# Patient Record
Sex: Male | Born: 1940 | Race: White | Hispanic: No | Marital: Married | Smoking: Never smoker
Health system: Southern US, Community
[De-identification: ages and names within clinical notes are randomized; demographics above are authoritative.]

## PROBLEM LIST (undated history)

## (undated) ENCOUNTER — Emergency Department (HOSPITAL_COMMUNITY): Admission: EM | Discharge status: Absent without leave | Payer: Medicare Other

## (undated) DIAGNOSIS — I10 Essential (primary) hypertension: Secondary | ICD-10-CM

## (undated) DIAGNOSIS — Z87442 Personal history of urinary calculi: Secondary | ICD-10-CM

## (undated) DIAGNOSIS — R0600 Dyspnea, unspecified: Secondary | ICD-10-CM

## (undated) DIAGNOSIS — N183 Chronic kidney disease, stage 3 unspecified: Secondary | ICD-10-CM

## (undated) DIAGNOSIS — H269 Unspecified cataract: Secondary | ICD-10-CM

## (undated) DIAGNOSIS — I350 Nonrheumatic aortic (valve) stenosis: Secondary | ICD-10-CM

## (undated) DIAGNOSIS — J849 Interstitial pulmonary disease, unspecified: Secondary | ICD-10-CM

## (undated) DIAGNOSIS — E785 Hyperlipidemia, unspecified: Secondary | ICD-10-CM

## (undated) DIAGNOSIS — N189 Chronic kidney disease, unspecified: Secondary | ICD-10-CM

## (undated) DIAGNOSIS — I441 Atrioventricular block, second degree: Secondary | ICD-10-CM

## (undated) DIAGNOSIS — R112 Nausea with vomiting, unspecified: Secondary | ICD-10-CM

## (undated) DIAGNOSIS — F419 Anxiety disorder, unspecified: Secondary | ICD-10-CM

## (undated) DIAGNOSIS — G4730 Sleep apnea, unspecified: Secondary | ICD-10-CM

## (undated) DIAGNOSIS — J84112 Idiopathic pulmonary fibrosis: Secondary | ICD-10-CM

## (undated) DIAGNOSIS — G709 Myoneural disorder, unspecified: Secondary | ICD-10-CM

## (undated) DIAGNOSIS — I2510 Atherosclerotic heart disease of native coronary artery without angina pectoris: Secondary | ICD-10-CM

## (undated) DIAGNOSIS — K219 Gastro-esophageal reflux disease without esophagitis: Secondary | ICD-10-CM

## (undated) DIAGNOSIS — E349 Endocrine disorder, unspecified: Secondary | ICD-10-CM

## (undated) DIAGNOSIS — F32A Depression, unspecified: Secondary | ICD-10-CM

## (undated) DIAGNOSIS — Z9889 Other specified postprocedural states: Secondary | ICD-10-CM

## (undated) DIAGNOSIS — G63 Polyneuropathy in diseases classified elsewhere: Secondary | ICD-10-CM

## (undated) DIAGNOSIS — D62 Acute posthemorrhagic anemia: Secondary | ICD-10-CM

## (undated) DIAGNOSIS — C801 Malignant (primary) neoplasm, unspecified: Secondary | ICD-10-CM

## (undated) HISTORY — DX: Chronic kidney disease, unspecified: N18.9

## (undated) HISTORY — DX: Hyperlipidemia, unspecified: E78.5

## (undated) HISTORY — DX: Sleep apnea, unspecified: G47.30

## (undated) HISTORY — PX: CATARACT EXTRACTION: SUR2

## (undated) HISTORY — PX: EYE SURGERY: SHX253

## (undated) HISTORY — DX: Essential (primary) hypertension: I10

## (undated) HISTORY — DX: Gastro-esophageal reflux disease without esophagitis: K21.9

## (undated) HISTORY — PX: OTHER SURGICAL HISTORY: SHX169

## (undated) HISTORY — DX: Unspecified cataract: H26.9

## (undated) HISTORY — DX: Myoneural disorder, unspecified: G70.9

---

## 1996-12-05 HISTORY — PX: KNEE SURGERY: SHX244

## 2000-07-31 ENCOUNTER — Ambulatory Visit (HOSPITAL_COMMUNITY): Admission: RE | Admit: 2000-07-31 | Discharge: 2000-07-31 | Payer: Self-pay | Admitting: Internal Medicine

## 2000-07-31 ENCOUNTER — Encounter: Payer: Self-pay | Admitting: Internal Medicine

## 2000-10-18 ENCOUNTER — Encounter: Payer: Self-pay | Admitting: Specialist

## 2000-10-18 ENCOUNTER — Ambulatory Visit (HOSPITAL_COMMUNITY): Admission: RE | Admit: 2000-10-18 | Discharge: 2000-10-18 | Payer: Self-pay | Admitting: Specialist

## 2001-01-29 ENCOUNTER — Encounter: Payer: Self-pay | Admitting: Specialist

## 2001-01-29 ENCOUNTER — Inpatient Hospital Stay (HOSPITAL_COMMUNITY): Admission: RE | Admit: 2001-01-29 | Discharge: 2001-02-01 | Payer: Self-pay | Admitting: Specialist

## 2001-08-20 ENCOUNTER — Other Ambulatory Visit: Admission: RE | Admit: 2001-08-20 | Discharge: 2001-08-20 | Payer: Self-pay | Admitting: Internal Medicine

## 2001-08-20 ENCOUNTER — Encounter (INDEPENDENT_AMBULATORY_CARE_PROVIDER_SITE_OTHER): Payer: Self-pay

## 2001-12-05 HISTORY — PX: LUMBAR LAMINECTOMY: SHX95

## 2002-10-11 ENCOUNTER — Encounter (INDEPENDENT_AMBULATORY_CARE_PROVIDER_SITE_OTHER): Payer: Self-pay | Admitting: *Deleted

## 2002-10-14 ENCOUNTER — Ambulatory Visit (HOSPITAL_COMMUNITY): Admission: RE | Admit: 2002-10-14 | Discharge: 2002-10-14 | Payer: Self-pay | Admitting: Internal Medicine

## 2002-12-29 ENCOUNTER — Emergency Department (HOSPITAL_COMMUNITY): Admission: EM | Admit: 2002-12-29 | Discharge: 2002-12-29 | Payer: Self-pay | Admitting: Emergency Medicine

## 2003-11-14 ENCOUNTER — Ambulatory Visit (HOSPITAL_BASED_OUTPATIENT_CLINIC_OR_DEPARTMENT_OTHER): Admission: RE | Admit: 2003-11-14 | Discharge: 2003-11-14 | Payer: Self-pay | Admitting: Otolaryngology

## 2006-02-12 ENCOUNTER — Inpatient Hospital Stay (HOSPITAL_COMMUNITY): Admission: EM | Admit: 2006-02-12 | Discharge: 2006-02-14 | Payer: Self-pay | Admitting: Emergency Medicine

## 2006-10-20 ENCOUNTER — Ambulatory Visit: Payer: Self-pay | Admitting: Internal Medicine

## 2006-11-15 ENCOUNTER — Encounter (INDEPENDENT_AMBULATORY_CARE_PROVIDER_SITE_OTHER): Payer: Self-pay | Admitting: *Deleted

## 2006-11-15 ENCOUNTER — Ambulatory Visit: Payer: Self-pay | Admitting: Internal Medicine

## 2007-09-16 ENCOUNTER — Observation Stay (HOSPITAL_COMMUNITY): Admission: EM | Admit: 2007-09-16 | Discharge: 2007-09-17 | Payer: Self-pay | Admitting: Emergency Medicine

## 2007-09-17 ENCOUNTER — Encounter (INDEPENDENT_AMBULATORY_CARE_PROVIDER_SITE_OTHER): Payer: Self-pay | Admitting: Internal Medicine

## 2007-09-19 ENCOUNTER — Ambulatory Visit (HOSPITAL_COMMUNITY): Admission: RE | Admit: 2007-09-19 | Discharge: 2007-09-19 | Payer: Self-pay | Admitting: Internal Medicine

## 2008-12-05 ENCOUNTER — Inpatient Hospital Stay (HOSPITAL_COMMUNITY): Admission: EM | Admit: 2008-12-05 | Discharge: 2008-12-08 | Payer: Self-pay | Admitting: Emergency Medicine

## 2008-12-05 ENCOUNTER — Ambulatory Visit: Payer: Self-pay | Admitting: Internal Medicine

## 2009-11-24 DIAGNOSIS — K219 Gastro-esophageal reflux disease without esophagitis: Secondary | ICD-10-CM | POA: Insufficient documentation

## 2009-12-05 HISTORY — PX: CARDIAC CATHETERIZATION: SHX172

## 2009-12-05 HISTORY — PX: TRIGGER FINGER RELEASE: SHX641

## 2010-11-19 ENCOUNTER — Encounter
Admission: RE | Admit: 2010-11-19 | Discharge: 2010-11-19 | Payer: Self-pay | Source: Home / Self Care | Attending: Specialist | Admitting: Specialist

## 2010-12-05 HISTORY — PX: CERVICAL LAMINECTOMY: SHX94

## 2010-12-05 HISTORY — PX: ANTERIOR FUSION CERVICAL SPINE: SUR626

## 2010-12-05 HISTORY — PX: POSTERIOR FUSION CERVICAL SPINE: SUR628

## 2011-02-03 ENCOUNTER — Inpatient Hospital Stay (HOSPITAL_COMMUNITY)
Admission: EM | Admit: 2011-02-03 | Discharge: 2011-02-05 | DRG: 392 | Disposition: A | Discharge status: Home / Self Care | Payer: Medicare Other | Attending: Internal Medicine | Admitting: Internal Medicine

## 2011-02-03 ENCOUNTER — Emergency Department (HOSPITAL_COMMUNITY): Payer: Medicare Other

## 2011-02-03 DIAGNOSIS — R42 Dizziness and giddiness: Secondary | ICD-10-CM | POA: Diagnosis present

## 2011-02-03 DIAGNOSIS — E785 Hyperlipidemia, unspecified: Secondary | ICD-10-CM | POA: Diagnosis present

## 2011-02-03 DIAGNOSIS — I4510 Unspecified right bundle-branch block: Secondary | ICD-10-CM | POA: Diagnosis present

## 2011-02-03 DIAGNOSIS — R0789 Other chest pain: Secondary | ICD-10-CM | POA: Diagnosis present

## 2011-02-03 DIAGNOSIS — G4733 Obstructive sleep apnea (adult) (pediatric): Secondary | ICD-10-CM | POA: Diagnosis present

## 2011-02-03 DIAGNOSIS — E119 Type 2 diabetes mellitus without complications: Secondary | ICD-10-CM | POA: Diagnosis present

## 2011-02-03 DIAGNOSIS — K219 Gastro-esophageal reflux disease without esophagitis: Principal | ICD-10-CM | POA: Diagnosis present

## 2011-02-03 DIAGNOSIS — I2510 Atherosclerotic heart disease of native coronary artery without angina pectoris: Secondary | ICD-10-CM | POA: Diagnosis present

## 2011-02-03 DIAGNOSIS — Z794 Long term (current) use of insulin: Secondary | ICD-10-CM

## 2011-02-03 DIAGNOSIS — I509 Heart failure, unspecified: Secondary | ICD-10-CM | POA: Diagnosis present

## 2011-02-03 DIAGNOSIS — I10 Essential (primary) hypertension: Secondary | ICD-10-CM | POA: Diagnosis present

## 2011-02-03 DIAGNOSIS — Z7982 Long term (current) use of aspirin: Secondary | ICD-10-CM

## 2011-02-03 LAB — CBC
HCT: 35.1 % — ABNORMAL LOW (ref 39.0–52.0)
Hemoglobin: 12.2 g/dL — ABNORMAL LOW (ref 13.0–17.0)
MCH: 30.5 pg (ref 26.0–34.0)
MCHC: 34.8 g/dL (ref 30.0–36.0)
MCV: 87.8 fL (ref 78.0–100.0)
Platelets: 225 10*3/uL (ref 150–400)
RBC: 4 MIL/uL — ABNORMAL LOW (ref 4.22–5.81)
RDW: 13.1 % (ref 11.5–15.5)
WBC: 7.6 10*3/uL (ref 4.0–10.5)

## 2011-02-03 LAB — BASIC METABOLIC PANEL
BUN: 42 mg/dL — ABNORMAL HIGH (ref 6–23)
CO2: 25 mEq/L (ref 19–32)
Calcium: 9.7 mg/dL (ref 8.4–10.5)
Chloride: 103 mEq/L (ref 96–112)
Creatinine, Ser: 1.66 mg/dL — ABNORMAL HIGH (ref 0.4–1.5)
GFR calc Af Amer: 50 mL/min — ABNORMAL LOW (ref >60)
GFR calc non Af Amer: 41 mL/min — ABNORMAL LOW (ref >60)
Glucose, Bld: 162 mg/dL — ABNORMAL HIGH (ref 70–99)
Potassium: 4.6 mEq/L (ref 3.5–5.1)
Sodium: 135 mEq/L (ref 135–145)

## 2011-02-03 LAB — DIFFERENTIAL
Basophils Absolute: 0 10*3/uL (ref 0.0–0.1)
Basophils Relative: 0 % (ref 0–1)
Eosinophils Absolute: 0.3 10*3/uL (ref 0.0–0.7)
Eosinophils Relative: 4 % (ref 0–5)
Lymphocytes Relative: 29 % (ref 12–46)
Lymphs Abs: 2.2 10*3/uL (ref 0.7–4.0)
Monocytes Absolute: 1 10*3/uL (ref 0.1–1.0)
Monocytes Relative: 14 % — ABNORMAL HIGH (ref 3–12)
Neutro Abs: 4 10*3/uL (ref 1.7–7.7)
Neutrophils Relative %: 53 % (ref 43–77)

## 2011-02-03 LAB — PROTIME-INR
INR: 1 (ref 0.00–1.49)
Prothrombin Time: 13.4 seconds (ref 11.6–15.2)

## 2011-02-03 LAB — POCT CARDIAC MARKERS
CKMB, poc: 1.5 ng/mL (ref 1.0–8.0)
Myoglobin, poc: 213 ng/mL (ref 12–200)
Troponin i, poc: <0.05 ng/mL (ref 0.00–0.09)

## 2011-02-03 LAB — APTT: aPTT: 32 seconds (ref 24–37)

## 2011-02-04 LAB — COMPREHENSIVE METABOLIC PANEL
ALT: 25 U/L (ref 0–53)
AST: 22 U/L (ref 0–37)
Albumin: 3.5 g/dL (ref 3.5–5.2)
Alkaline Phosphatase: 68 U/L (ref 39–117)
BUN: 39 mg/dL — ABNORMAL HIGH (ref 6–23)
CO2: 20 mEq/L (ref 19–32)
Calcium: 9.4 mg/dL (ref 8.4–10.5)
Chloride: 102 mEq/L (ref 96–112)
Creatinine, Ser: 1.58 mg/dL — ABNORMAL HIGH (ref 0.4–1.5)
GFR calc Af Amer: 53 mL/min — ABNORMAL LOW (ref >60)
GFR calc non Af Amer: 44 mL/min — ABNORMAL LOW (ref >60)
Glucose, Bld: 117 mg/dL — ABNORMAL HIGH (ref 70–99)
Potassium: 4.3 mEq/L (ref 3.5–5.1)
Sodium: 130 mEq/L — ABNORMAL LOW (ref 135–145)
Total Bilirubin: 0.6 mg/dL (ref 0.3–1.2)
Total Protein: 6.9 g/dL (ref 6.0–8.3)

## 2011-02-04 LAB — URINALYSIS, ROUTINE W REFLEX MICROSCOPIC
Bilirubin Urine: NEGATIVE
Hgb urine dipstick: NEGATIVE
Ketones, ur: NEGATIVE mg/dL
Nitrite: NEGATIVE
Protein, ur: NEGATIVE mg/dL
Specific Gravity, Urine: 1.016 (ref 1.005–1.030)
Urine Glucose, Fasting: NEGATIVE mg/dL
Urobilinogen, UA: 0.2 mg/dL (ref 0.0–1.0)
pH: 5 (ref 5.0–8.0)

## 2011-02-04 LAB — LIPID PANEL
Cholesterol: 127 mg/dL (ref 0–200)
HDL: 29 mg/dL — ABNORMAL LOW (ref >39)
LDL Cholesterol: 58 mg/dL (ref 0–99)
Total CHOL/HDL Ratio: 4.4 RATIO
Triglycerides: 201 mg/dL — ABNORMAL HIGH (ref <150)
VLDL: 40 mg/dL (ref 0–40)

## 2011-02-04 LAB — GLUCOSE, CAPILLARY
Glucose-Capillary: 162 mg/dL — ABNORMAL HIGH (ref 70–99)
Glucose-Capillary: 163 mg/dL — ABNORMAL HIGH (ref 70–99)
Glucose-Capillary: 130 mg/dL — ABNORMAL HIGH (ref 70–99)

## 2011-02-04 LAB — MRSA PCR SCREENING: MRSA by PCR: NEGATIVE

## 2011-02-04 LAB — CK TOTAL AND CKMB (NOT AT ARMC)
CK, MB: 2.4 ng/mL (ref 0.3–4.0)
Relative Index: 1.6 (ref 0.0–2.5)
Total CK: 151 U/L (ref 7–232)

## 2011-02-04 LAB — TROPONIN I: Troponin I: 0.02 ng/mL (ref 0.00–0.06)

## 2011-02-04 LAB — CARDIAC PANEL(CRET KIN+CKTOT+MB+TROPI)
CK, MB: 2.6 ng/mL (ref 0.3–4.0)
CK, MB: 2.4 ng/mL (ref 0.3–4.0)
Relative Index: 1.9 (ref 0.0–2.5)
Relative Index: 1.9 (ref 0.0–2.5)
Total CK: 134 U/L (ref 7–232)
Total CK: 129 U/L (ref 7–232)
Troponin I: 0.01 ng/mL (ref 0.00–0.06)
Troponin I: 0.01 ng/mL (ref 0.00–0.06)

## 2011-02-04 LAB — CBC
HCT: 32.7 % — ABNORMAL LOW (ref 39.0–52.0)
Hemoglobin: 11.3 g/dL — ABNORMAL LOW (ref 13.0–17.0)
MCH: 30.4 pg (ref 26.0–34.0)
MCHC: 34.6 g/dL (ref 30.0–36.0)
MCV: 87.9 fL (ref 78.0–100.0)
Platelets: 196 10*3/uL (ref 150–400)
RBC: 3.72 MIL/uL — ABNORMAL LOW (ref 4.22–5.81)
RDW: 13.3 % (ref 11.5–15.5)
WBC: 8.4 10*3/uL (ref 4.0–10.5)

## 2011-02-04 LAB — BASIC METABOLIC PANEL
BUN: 35 mg/dL — ABNORMAL HIGH (ref 6–23)
CO2: 20 mEq/L (ref 19–32)
Calcium: 9.2 mg/dL (ref 8.4–10.5)
Chloride: 105 mEq/L (ref 96–112)
Creatinine, Ser: 1.49 mg/dL (ref 0.4–1.5)
GFR calc Af Amer: 57 mL/min — ABNORMAL LOW (ref >60)
GFR calc non Af Amer: 47 mL/min — ABNORMAL LOW (ref >60)
Glucose, Bld: 124 mg/dL — ABNORMAL HIGH (ref 70–99)
Potassium: 5.1 mEq/L (ref 3.5–5.1)
Sodium: 134 mEq/L — ABNORMAL LOW (ref 135–145)

## 2011-02-04 LAB — TSH: TSH: 3.252 u[IU]/mL (ref 0.350–4.500)

## 2011-02-04 LAB — HEMOGLOBIN A1C
Hgb A1c MFr Bld: 9.3 % — ABNORMAL HIGH (ref <5.7)
Mean Plasma Glucose: 220 mg/dL — ABNORMAL HIGH (ref <117)

## 2011-02-04 LAB — MAGNESIUM: Magnesium: 1.9 mg/dL (ref 1.5–2.5)

## 2011-02-04 LAB — HEPARIN LEVEL (UNFRACTIONATED): Heparin Unfractionated: 0.42 IU/mL (ref 0.30–0.70)

## 2011-02-04 LAB — BRAIN NATRIURETIC PEPTIDE: Pro B Natriuretic peptide (BNP): <30 pg/mL (ref 0.0–100.0)

## 2011-02-05 LAB — GLUCOSE, CAPILLARY
Glucose-Capillary: 113 mg/dL — ABNORMAL HIGH (ref 70–99)
Glucose-Capillary: 155 mg/dL — ABNORMAL HIGH (ref 70–99)
Glucose-Capillary: 166 mg/dL — ABNORMAL HIGH (ref 70–99)
Glucose-Capillary: 198 mg/dL — ABNORMAL HIGH (ref 70–99)
Glucose-Capillary: 246 mg/dL — ABNORMAL HIGH (ref 70–99)
Glucose-Capillary: 250 mg/dL — ABNORMAL HIGH (ref 70–99)

## 2011-02-05 LAB — BASIC METABOLIC PANEL
BUN: 25 mg/dL — ABNORMAL HIGH (ref 6–23)
CO2: 25 mEq/L (ref 19–32)
Calcium: 8.8 mg/dL (ref 8.4–10.5)
Chloride: 105 mEq/L (ref 96–112)
Creatinine, Ser: 1.44 mg/dL (ref 0.4–1.5)
GFR calc Af Amer: 59 mL/min — ABNORMAL LOW (ref >60)
GFR calc non Af Amer: 49 mL/min — ABNORMAL LOW (ref >60)
Glucose, Bld: 242 mg/dL — ABNORMAL HIGH (ref 70–99)
Potassium: 5.1 mEq/L (ref 3.5–5.1)
Sodium: 137 mEq/L (ref 135–145)

## 2011-02-05 LAB — D-DIMER, QUANTITATIVE: D-Dimer, Quant: 0.39 ug/mL-FEU (ref 0.00–0.48)

## 2011-02-05 LAB — VANCOMYCIN, RANDOM: Vancomycin Rm: <5 ug/mL

## 2011-02-07 LAB — POCT ACTIVATED CLOTTING TIME: Activated Clotting Time: 128 seconds

## 2011-02-11 NOTE — Discharge Summary (Signed)
NAMESHARONE, ALMOND NO.:  [id number]  MEDICAL RECORD NO.:  [id number]           PATIENT TYPE:  I  LOCATION:  2007                         FACILITY:  [facility]  PHYSICIAN:  Italy Hilty, MD         DATE OF BIRTH:  04/25/1941  DATE OF ADMISSION:  02/03/2011 DATE OF DISCHARGE:  02/05/2011                              DISCHARGE SUMMARY   DISCHARGE DIAGNOSES: 1. Chest pain secondary to gastroesophageal reflux disease.     a.     Negative myocardial infarction.     b.     Stable coronary artery disease. 2. Coronary artery disease with 20% ostial circumflex disease, 60%     distal circ, and proximal 20-30% RCA disease. 3. 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 4. Diabetes mellitus type 2, insulin dependent with pump, poorly     controlled, followed with primary care. 5. Mixed dyslipidemia, should improve with improved diabetes control. 6. Obstructive sleep apnea, on CPAP. 7. Hypertension, controlled.8. Chronic vertigo/vertebrobasilar insufficiency.  DISCHARGE CONDITION:  Improved.  PROCEDURES:  Combined left heart cath by Dr. Julieanne Manson with nonobstructive coronary artery disease on February 04, 2011.  DISCHARGE MEDICATIONS:  See medication reconciliation sheet from [facility]. We added Protonix.  DISCHARGE INSTRUCTIONS: 1. Increase activity slowly.  May shower.  No lifting for 2 days.  No     driving for 2 days.  No sexual activity for 2 days.  Low-sodium,     heart-healthy, diabetic diet. 2. Continue insulin pump. 3. Continue CPAP. 4. Wash cath site with soap and water.  Call if any bleeding,     swelling, or drainage. 5. Follow up with Dr. Clarene Duke.  Office will call with date and time. 6. Follow up with Dr. Jacky Kindle in 1-2 weeks.  HOSPITAL COURSE:  Mr. Alkhatib is a 70 year old patient with no prior coronary artery disease, who came to the emergency room on February 03, 2011, with complaints of chest discomfort.  He had been working on the day of admission, painting  and doing various activities.  On his way home from work, he began to experience substernal chest discomfort with radiation into his left arm.  He complained of numbness in the left arm and mild shortness of breath as well as diaphoresis.  He denied any nausea associated with that.  He received sublingual nitro with significant improvement in the chest discomfort.  He was started on IV nitro and IV heparin.  His EKG was sinus rhythm with right bundle branch block which was unchanged, and he was made n.p.o. for plans for cardiac catheterization with his risk factors of hypertension, dyslipidemia, diabetes mellitus type 2, and sleep apnea.  The patient was then monitored and his cardiac enzymes were negative. He underwent cardiac catheterization and was found to have nonobstructive disease.  By the next morning, he was stable, was able to ambulate without problems.  We did add the PPI as his likely cause of pain was reflux disease.  We did do a D-dimer, which was negative.  He will follow up with Dr. Clarene Duke and Dr. Jacky Kindle.  LABORATORY DATA:  At discharge, sodium 137, potassium 5.1, chloride 105, CO2 25, glucose 242, BUN 25, creatinine 1.44, and calcium 8.8.  D-dimer was 0.39.  All cardiac markers were negative with CK 129, MB 2.4, and troponin I 0.01.  Hemoglobin A1c was 9.3.  TSH was 3.252.  Total cholesterol 127, triglycerides 201, HDL 29, LDL 58.  MRSA screening was negative.  Magnesium was 1.9.  Liver enzymes were negative with SGOT 22, SGPT 25.  UA was clear.  BNP was less than 30.  RADIOLOGY:  Chest x-ray, mild congestive failure with central edema. EKG, sinus rhythm with a right bundle branch block to sinus brady, rate of 52.  Patient will follow up as instructed.  He was seen and discharged by Dr. Royann Shivers after a lengthy discussion.     Darcella Gasman. Annie Paras, N.P.   ______________________________ Italy Hilty, MD    LRI/MEDQ  D:  02/05/2011  T:  02/06/2011  Job:   [id number]  cc:   [name], M.D. Thereasa Solo. Little, M.D.  Electronically Signed by Nada Boozer N.P. on 02/08/2011 05:48:05 PM Electronically Signed by Kirtland Bouchard. HILTY M.D. on 02/10/2011 08:01:53 AM

## 2011-02-11 NOTE — H&P (Signed)
NAMEPRECIOUS, [name] NO.:  [id number]  MEDICAL RECORD NO.:  [id number]           PATIENT TYPE:  I  LOCATION:  3308                         FACILITY:  [facility]  PHYSICIAN:  Italy Hilty, MD         DATE OF BIRTH:  Jun 13, 1941  DATE OF ADMISSION:  02/03/2011 DATE OF DISCHARGE:                             HISTORY & PHYSICAL   CHIEF COMPLAINT:  Chest pain.  HISTORY OF PRESENT ILLNESS:  Shane [name] is a very pleasant 70 year old white male who presents to the emergency department with complaints of chest discomfort.  He has no previous cardiac history, however, he does have hypertension and type 2 diabetes mellitus and is on insulin pump. He reports that while he had been working today, he been painting and doing various activities, however, on his way home from work, he began to experience substernal chest discomfort which radiated into his left arm.  He complained of numbness in that left arm and discomfort.  He reported some mild shortness of breath as well as diaphoresis.  He denied any nausea associated with this.  He denies experiencing any chest discomfort in the past.  No exertional symptoms.  He denies any tachycardia or palpitations.  No lightheadedness, dizziness, no syncope or presyncope.  He does have a history of chronic vertigo versus vertebral basilar insufficiency and has had extensive workup in regards to that.  It is currently controlled with 5 mg of Valium which he takes at bedtime.  In the emergency department, he received sublingual nitroglycerin with significant improvement in his chest discomfort.  IV nitro was initiated.  His EKG on arrival revealed normal sinus rhythm with a right bundle branch block which was unchanged from his previous EKGs.  The initial point-of-care markers revealed a myoglobin of 213, CK- MB of 1.5, and troponin of less than 0.05.  He was started on IV nitroglycerin at 5 mcg and currently his pain is a 2/10.  He denies  any heartburn, no indigestion, no fevers or chills.  Overall he states much improvement.  PAST MEDICAL HISTORY: 1. Hypertension. 2. Dyslipidemia. 3. Type II diabetes mellitus on insulin pump, difficult to control. 4. Gastroesophageal reflux disease. 5. Chronic vertigo/vertebral basilar insufficiency. 6. Obstructive sleep apnea on  CPAP. 7. Status post lumbar surgery. 8. Status post knee surgery. 9. Obesity.  FAMILY HISTORY:  Noncontributory.  SOCIAL HISTORY:  He is married, has 2 children.  He is a Surveyor, minerals and remains very active.  He denies any tobacco or alcohol use.  ALLERGIES:  CODEINE.  CURRENT MEDICATIONS: 1. Lisinopril 40 mg daily. 2. Bystolic 10 mg daily. 3. Aspirin 325 daily. 4. Crestor 5 mg daily. 5. Multivitamin daily. 6. Maxzide 37.5/25 mg daily. 7. Valium 5 mg p.o. at bedtime.  REVIEW OF SYSTEMS:  All systems have been reviewed.  He does report some pain in his legs when he walks which he believes may be related to his back problem, his back pain, and disk disease.  Otherwise his review of systems is negative.  PHYSICAL EXAMINATION:  VITAL SIGNS:  Blood pressure is 136/59, pulse is 68  and regular, respirations 16, temperature is 98.8, O2 sat 98. GENERAL:  This is a pleasant 70 year old white male in no acute distress. HEENT:  Pupils are equal and reactive to light accommodation.  External movements are intact. NECK:  Supple.  No JVD, no carotid bruits.  No thyromegaly. CARDIOVASCULAR:  Regular rate and rhythm.  S1, S2 without appreciable murmur, gallop, or rub. LUNGS:  Clear to auscultation bilaterally with normal respiratory effort. ABDOMEN:  Obese, soft, nontender without hepatosplenomegaly or masses. Bowel sounds are present. EXTREMITIES:  There is a trace of lower extremity edema bilaterally. Dorsalis pedis and posterior tibialis are faint bilaterally. SKIN:  Pink, warm, and dry. NEUROLOGIC:  Oriented to person, place, time, normal mood and  affect. Cranial nerves II through XII are grossly intact.  LABORATORY DATA:  BNP is less than 30.  BMET:  Sodium is 135, potassium is 4.6, chloride is 103, CO2 is 25, glucose is 162, BUN 42, creatinine 1.66, calcium is 9.7.  INR is 1.0, PT is 13.4, PTT is 32.  Myoglobin is 213.  CK-MB is 1.5, troponin is less than 0.05.  Hemoglobin is 12.2, hematocrit 35.1, and platelets are 225,000.  IMPRESSION: 1. Chest pain/unstable angina. 2. Type II diabetes mellitus on insulin pump. 3. Hypertension. 4. Dyslipidemia. 5. Obesity. 6. Chronic vertigo.  PLAN:  We will admit to step-down unit with IV nitroglycerin and heparin.  We will continue his home medications with the exception of the hydrochlorothiazide.  We will hold that.  We will provide a CPAP for him tonight given he does have sleep apnea.  We will cycle his cardiac enzymes and rule out myocardial infarction.  We will check an echocardiogram to assess for any valvular disease as well as assess his LV function.  We will keep him n.p.o. after midnight and schedule him for cardiac catheterization in the morning to assess for obstructive coronary artery disease which may be contributing to his discomfort.  He does have multiple risk factors for coronary disease including diabetes, dyslipidemia, hypertension, and obesity.  The cardiac catheterization procedure was reviewed, risks and benefits were covered as well and he states understanding and is willing to proceed.    ______________________________ Charmian Muff, NP   ______________________________ [place] Hilty, MD    LS/MEDQ  D:  02/04/2011  T:  02/04/2011  Job:  [id number]  Electronically Signed by Charmian Muff NP on 02/09/2011 10:04:53 AM Electronically Signed by K. HILTY M.D. on 02/10/2011 08:01:47 AM

## 2011-02-11 NOTE — Procedures (Signed)
NAMELONZO, SAULTER NO.:  [id number]  MEDICAL RECORD NO.:  [id number]           PATIENT TYPE:  I  LOCATION:  2007                         FACILITY:  [facility]  PHYSICIAN:  Thereasa Solo. Little, M.D. DATE OF BIRTH:  1941-01-25  DATE OF PROCEDURE:  02/04/2011 DATE OF DISCHARGE:                           CARDIAC CATHETERIZATION   INDICATIONS FOR TEST:  This 70 year old male was admitted on February 03, 2011, with chest pain.  His EKG and cardiac markers are unremarkable except for right bundle-branch block.  He does have hypertension, diabetes, and hyperlipidemia.  Because of his risk factors and complaints of discomfort, he was brought to the cath lab.  It should be pointed out that lying in bed earlier this morning, he was still having mild chest discomfort.  After obtaining informed consent, the patient was prepped and draped in the usual sterile fashion, exposing the right groin.  Following local anesthetic with 1% Xylocaine, the Seldinger technique was employed and a 5-French introducer sheath was placed in the right femoral artery.  Left and right coronary arteriography and ventriculography was performed.  COMPLICATIONS:  None.  TOTAL CONTRAST USED:  110 mL.  EQUIPMENT:  A 5-French Judkins configuration catheters.  The patient was given intracoronary nitroglycerin x1.  RESULTS: 1. Hemodynamic monitoring:  Central aortic pressure 113/54.  Left     ventricular pressure 117/8 with no aortic valve gradient noted at     the time of pullback. 2. Ventriculography:  Ventriculography in the RAO projection revealed     mild global hypokinesis with an ejection fraction around 45-50%     with no focal wall motion abnormalities.  Left ventricular end-     diastolic pressure was 18.  I did not appreciate mitral     regurgitation. 3. Coronary arteriography:  Calcification on fluoroscopy was seen in     the left main, the proximal third of the LAD, and scattered  throughout the proximal half of the right coronary artery.  All the     blood vessels were smaller in diameter, consistent with diabetic     vascular disease.     a.     Left main.  The left main was small and bifurcated.  There      was minimal ostial narrowing.     b.     Circumflex.  The circumflex had an ostial 20% area of      narrowing.  The OM vessel was free of disease as was the ongoing      circumflex.     c.     LAD.  The LAD had minimal proximal irregularities.  The      distal circ before across the apex of the heart had an area of      about 60% narrowing.  There was no change in this with      intracoronary nitroglycerin and the remainder of the LAD was free      of disease.     d.     The first diagonal was free of disease.     e.  Right coronary artery.  The right coronary artery was      actually the largest and longest of all his vessels.  There was      proximal 20 and 30% areas of narrowing.  The mid and distal vessel      was free of disease.  The PDA was smaller in diameter less than 2      mm with a 60% area of narrowing in its midportion.  There were 3      posterolateral vessels, all of which were free of disease.  CONCLUSION: 1. LV systolic function of 45-50%. 2. Mild coronary disease with no high-grade focal stenosis that would     explain his chest discomfort.  I will plan to order D-dimer and if it is elevated, he will need a chest CT.  Because of his diabetes and a creatinine of 1.49, I will plan to hydrate him and check a BMP in the morning.  I will empirically start him on PPI.  He may be ready for discharge on Saturday.          ______________________________ Thereasa Solo. Little, M.D.     ABL/MEDQ  D:  02/04/2011  T:  02/05/2011  Job:  [id number]  cc:   Nanetta Batty, M.D. Dr. Jacky Kindle  Electronically Signed by Julieanne Manson M.D. on 02/11/2011 02:26:35 PM

## 2011-03-21 LAB — CARDIAC PANEL(CRET KIN+CKTOT+MB+TROPI)
CK, MB: 5.1 ng/mL — ABNORMAL HIGH (ref 0.3–4.0)
CK, MB: 6 ng/mL — ABNORMAL HIGH (ref 0.3–4.0)
CK, MB: 6.5 ng/mL — ABNORMAL HIGH (ref 0.3–4.0)
Relative Index: 0 (ref 0.0–2.5)
Relative Index: 0.1 (ref 0.0–2.5)
Relative Index: 0.1 (ref 0.0–2.5)
Total CK: 11865 U/L — ABNORMAL HIGH (ref 7–232)
Total CK: 4148 U/L — ABNORMAL HIGH (ref 7–232)
Total CK: 4805 U/L — ABNORMAL HIGH (ref 7–232)
Troponin I: 0.03 ng/mL (ref 0.00–0.06)
Troponin I: 0.03 ng/mL (ref 0.00–0.06)
Troponin I: 0.04 ng/mL (ref 0.00–0.06)

## 2011-03-21 LAB — BASIC METABOLIC PANEL
BUN: 16 mg/dL (ref 6–23)
BUN: 22 mg/dL (ref 6–23)
BUN: 50 mg/dL — ABNORMAL HIGH (ref 6–23)
BUN: 62 mg/dL — ABNORMAL HIGH (ref 6–23)
CO2: 21 mEq/L (ref 19–32)
CO2: 21 mEq/L (ref 19–32)
CO2: 25 mEq/L (ref 19–32)
CO2: 26 mEq/L (ref 19–32)
Calcium: 6.9 mg/dL — ABNORMAL LOW (ref 8.4–10.5)
Calcium: 6.9 mg/dL — ABNORMAL LOW (ref 8.4–10.5)
Calcium: 7 mg/dL — ABNORMAL LOW (ref 8.4–10.5)
Calcium: 7.7 mg/dL — ABNORMAL LOW (ref 8.4–10.5)
Chloride: 105 mEq/L (ref 96–112)
Chloride: 106 mEq/L (ref 96–112)
Chloride: 107 mEq/L (ref 96–112)
Chloride: 108 mEq/L (ref 96–112)
Creatinine, Ser: 1.15 mg/dL (ref 0.4–1.5)
Creatinine, Ser: 1.36 mg/dL (ref 0.4–1.5)
Creatinine, Ser: 1.76 mg/dL — ABNORMAL HIGH (ref 0.4–1.5)
Creatinine, Ser: 2.4 mg/dL — ABNORMAL HIGH (ref 0.4–1.5)
GFR calc Af Amer: 33 mL/min — ABNORMAL LOW (ref >60)
GFR calc Af Amer: 47 mL/min — ABNORMAL LOW (ref >60)
GFR calc Af Amer: >60 mL/min (ref >60)
GFR calc Af Amer: >60 mL/min (ref >60)
GFR calc non Af Amer: 27 mL/min — ABNORMAL LOW (ref >60)
GFR calc non Af Amer: 39 mL/min — ABNORMAL LOW (ref >60)
GFR calc non Af Amer: 52 mL/min — ABNORMAL LOW (ref >60)
GFR calc non Af Amer: >60 mL/min (ref >60)
Glucose, Bld: 107 mg/dL — ABNORMAL HIGH (ref 70–99)
Glucose, Bld: 128 mg/dL — ABNORMAL HIGH (ref 70–99)
Glucose, Bld: 164 mg/dL — ABNORMAL HIGH (ref 70–99)
Glucose, Bld: 81 mg/dL (ref 70–99)
Potassium: 3.5 mEq/L (ref 3.5–5.1)
Potassium: 3.6 mEq/L (ref 3.5–5.1)
Potassium: 4.2 mEq/L (ref 3.5–5.1)
Potassium: 4.3 mEq/L (ref 3.5–5.1)
Sodium: 133 mEq/L — ABNORMAL LOW (ref 135–145)
Sodium: 134 mEq/L — ABNORMAL LOW (ref 135–145)
Sodium: 138 mEq/L (ref 135–145)
Sodium: 140 mEq/L (ref 135–145)

## 2011-03-21 LAB — CBC
HCT: 30.9 % — ABNORMAL LOW (ref 39.0–52.0)
HCT: 32.1 % — ABNORMAL LOW (ref 39.0–52.0)
HCT: 32.2 % — ABNORMAL LOW (ref 39.0–52.0)
HCT: 33.2 % — ABNORMAL LOW (ref 39.0–52.0)
HCT: 36.9 % — ABNORMAL LOW (ref 39.0–52.0)
Hemoglobin: 10.7 g/dL — ABNORMAL LOW (ref 13.0–17.0)
Hemoglobin: 10.9 g/dL — ABNORMAL LOW (ref 13.0–17.0)
Hemoglobin: 11.1 g/dL — ABNORMAL LOW (ref 13.0–17.0)
Hemoglobin: 11.4 g/dL — ABNORMAL LOW (ref 13.0–17.0)
Hemoglobin: 12.6 g/dL — ABNORMAL LOW (ref 13.0–17.0)
MCHC: 33.7 g/dL (ref 30.0–36.0)
MCHC: 34.2 g/dL (ref 30.0–36.0)
MCHC: 34.3 g/dL (ref 30.0–36.0)
MCHC: 34.6 g/dL (ref 30.0–36.0)
MCHC: 34.7 g/dL (ref 30.0–36.0)
MCV: 90.2 fL (ref 78.0–100.0)
MCV: 90.3 fL (ref 78.0–100.0)
MCV: 90.4 fL (ref 78.0–100.0)
MCV: 90.9 fL (ref 78.0–100.0)
MCV: 91.4 fL (ref 78.0–100.0)
Platelets: 150 10*3/uL (ref 150–400)
Platelets: 152 10*3/uL (ref 150–400)
Platelets: 156 10*3/uL (ref 150–400)
Platelets: 164 10*3/uL (ref 150–400)
Platelets: 199 10*3/uL (ref 150–400)
RBC: 3.42 MIL/uL — ABNORMAL LOW (ref 4.22–5.81)
RBC: 3.52 MIL/uL — ABNORMAL LOW (ref 4.22–5.81)
RBC: 3.53 MIL/uL — ABNORMAL LOW (ref 4.22–5.81)
RBC: 3.67 MIL/uL — ABNORMAL LOW (ref 4.22–5.81)
RBC: 4.09 MIL/uL — ABNORMAL LOW (ref 4.22–5.81)
RDW: 13 % (ref 11.5–15.5)
RDW: 13.1 % (ref 11.5–15.5)
RDW: 13.3 % (ref 11.5–15.5)
RDW: 13.6 % (ref 11.5–15.5)
RDW: 13.6 % (ref 11.5–15.5)
WBC: 5.6 10*3/uL (ref 4.0–10.5)
WBC: 6.5 10*3/uL (ref 4.0–10.5)
WBC: 6.9 10*3/uL (ref 4.0–10.5)
WBC: 8.6 10*3/uL (ref 4.0–10.5)
WBC: 8.8 10*3/uL (ref 4.0–10.5)

## 2011-03-21 LAB — GLUCOSE, CAPILLARY
Glucose-Capillary: 100 mg/dL — ABNORMAL HIGH (ref 70–99)
Glucose-Capillary: 102 mg/dL — ABNORMAL HIGH (ref 70–99)
Glucose-Capillary: 103 mg/dL — ABNORMAL HIGH (ref 70–99)
Glucose-Capillary: 105 mg/dL — ABNORMAL HIGH (ref 70–99)
Glucose-Capillary: 106 mg/dL — ABNORMAL HIGH (ref 70–99)
Glucose-Capillary: 106 mg/dL — ABNORMAL HIGH (ref 70–99)
Glucose-Capillary: 107 mg/dL — ABNORMAL HIGH (ref 70–99)
Glucose-Capillary: 108 mg/dL — ABNORMAL HIGH (ref 70–99)
Glucose-Capillary: 109 mg/dL — ABNORMAL HIGH (ref 70–99)
Glucose-Capillary: 112 mg/dL — ABNORMAL HIGH (ref 70–99)
Glucose-Capillary: 116 mg/dL — ABNORMAL HIGH (ref 70–99)
Glucose-Capillary: 119 mg/dL — ABNORMAL HIGH (ref 70–99)
Glucose-Capillary: 119 mg/dL — ABNORMAL HIGH (ref 70–99)
Glucose-Capillary: 120 mg/dL — ABNORMAL HIGH (ref 70–99)
Glucose-Capillary: 120 mg/dL — ABNORMAL HIGH (ref 70–99)
Glucose-Capillary: 121 mg/dL — ABNORMAL HIGH (ref 70–99)
Glucose-Capillary: 123 mg/dL — ABNORMAL HIGH (ref 70–99)
Glucose-Capillary: 125 mg/dL — ABNORMAL HIGH (ref 70–99)
Glucose-Capillary: 126 mg/dL — ABNORMAL HIGH (ref 70–99)
Glucose-Capillary: 132 mg/dL — ABNORMAL HIGH (ref 70–99)
Glucose-Capillary: 134 mg/dL — ABNORMAL HIGH (ref 70–99)
Glucose-Capillary: 136 mg/dL — ABNORMAL HIGH (ref 70–99)
Glucose-Capillary: 138 mg/dL — ABNORMAL HIGH (ref 70–99)
Glucose-Capillary: 139 mg/dL — ABNORMAL HIGH (ref 70–99)
Glucose-Capillary: 140 mg/dL — ABNORMAL HIGH (ref 70–99)
Glucose-Capillary: 141 mg/dL — ABNORMAL HIGH (ref 70–99)
Glucose-Capillary: 142 mg/dL — ABNORMAL HIGH (ref 70–99)
Glucose-Capillary: 150 mg/dL — ABNORMAL HIGH (ref 70–99)
Glucose-Capillary: 150 mg/dL — ABNORMAL HIGH (ref 70–99)
Glucose-Capillary: 154 mg/dL — ABNORMAL HIGH (ref 70–99)
Glucose-Capillary: 165 mg/dL — ABNORMAL HIGH (ref 70–99)
Glucose-Capillary: 166 mg/dL — ABNORMAL HIGH (ref 70–99)
Glucose-Capillary: 180 mg/dL — ABNORMAL HIGH (ref 70–99)
Glucose-Capillary: 181 mg/dL — ABNORMAL HIGH (ref 70–99)
Glucose-Capillary: 201 mg/dL — ABNORMAL HIGH (ref 70–99)
Glucose-Capillary: 203 mg/dL — ABNORMAL HIGH (ref 70–99)
Glucose-Capillary: 214 mg/dL — ABNORMAL HIGH (ref 70–99)
Glucose-Capillary: 217 mg/dL — ABNORMAL HIGH (ref 70–99)
Glucose-Capillary: 299 mg/dL — ABNORMAL HIGH (ref 70–99)
Glucose-Capillary: 450 mg/dL — ABNORMAL HIGH (ref 70–99)
Glucose-Capillary: 76 mg/dL (ref 70–99)
Glucose-Capillary: 82 mg/dL (ref 70–99)
Glucose-Capillary: 91 mg/dL (ref 70–99)
Glucose-Capillary: 92 mg/dL (ref 70–99)
Glucose-Capillary: 96 mg/dL (ref 70–99)
Glucose-Capillary: 97 mg/dL (ref 70–99)

## 2011-03-21 LAB — HEPATIC FUNCTION PANEL
ALT: 33 U/L (ref 0–53)
AST: 47 U/L — ABNORMAL HIGH (ref 0–37)
Albumin: 3 g/dL — ABNORMAL LOW (ref 3.5–5.2)
Alkaline Phosphatase: 49 U/L (ref 39–117)
Bilirubin, Direct: 0.2 mg/dL (ref 0.0–0.3)
Indirect Bilirubin: 0.8 mg/dL (ref 0.3–0.9)
Total Bilirubin: 1 mg/dL (ref 0.3–1.2)
Total Protein: 6 g/dL (ref 6.0–8.3)

## 2011-03-21 LAB — CULTURE, BLOOD (ROUTINE X 2)
Culture: NO GROWTH
Culture: NO GROWTH

## 2011-03-21 LAB — URINE MICROSCOPIC-ADD ON

## 2011-03-21 LAB — URINALYSIS, ROUTINE W REFLEX MICROSCOPIC
Bilirubin Urine: NEGATIVE
Glucose, UA: >1000 mg/dL — AB
Hgb urine dipstick: NEGATIVE
Leukocytes, UA: NEGATIVE
Nitrite: NEGATIVE
Protein, ur: NEGATIVE mg/dL
Specific Gravity, Urine: 1.02 (ref 1.005–1.030)
Urobilinogen, UA: 0.2 mg/dL (ref 0.0–1.0)
pH: 5 (ref 5.0–8.0)

## 2011-03-21 LAB — COMPREHENSIVE METABOLIC PANEL
ALT: 36 U/L (ref 0–53)
AST: 30 U/L (ref 0–37)
Albumin: 3.4 g/dL — ABNORMAL LOW (ref 3.5–5.2)
Alkaline Phosphatase: 59 U/L (ref 39–117)
BUN: 61 mg/dL — ABNORMAL HIGH (ref 6–23)
CO2: 20 mEq/L (ref 19–32)
Calcium: 8.3 mg/dL — ABNORMAL LOW (ref 8.4–10.5)
Chloride: 97 mEq/L (ref 96–112)
Creatinine, Ser: 2.8 mg/dL — ABNORMAL HIGH (ref 0.4–1.5)
GFR calc Af Amer: 27 mL/min — ABNORMAL LOW (ref >60)
GFR calc non Af Amer: 23 mL/min — ABNORMAL LOW (ref >60)
Glucose, Bld: 430 mg/dL — ABNORMAL HIGH (ref 70–99)
Potassium: 6.2 mEq/L — ABNORMAL HIGH (ref 3.5–5.1)
Sodium: 133 mEq/L — ABNORMAL LOW (ref 135–145)
Total Bilirubin: 1 mg/dL (ref 0.3–1.2)
Total Protein: 7.5 g/dL (ref 6.0–8.3)

## 2011-03-21 LAB — BRAIN NATRIURETIC PEPTIDE: Pro B Natriuretic peptide (BNP): 77.2 pg/mL (ref 0.0–100.0)

## 2011-03-21 LAB — CK
Total CK: 1597 U/L — ABNORMAL HIGH (ref 7–232)
Total CK: 2779 U/L — ABNORMAL HIGH (ref 7–232)

## 2011-03-21 LAB — BLOOD GAS, VENOUS
Acid-base deficit: 5.4 mmol/L — ABNORMAL HIGH (ref 0.0–2.0)
Bicarbonate: 19.9 mEq/L — ABNORMAL LOW (ref 20.0–24.0)
FIO2: 0.21 %
O2 Saturation: 56.4 %
Patient temperature: 98.6
TCO2: 18.4 mmol/L (ref 0–100)
pCO2, Ven: 40.1 mmHg — ABNORMAL LOW (ref 45.0–50.0)
pH, Ven: 7.315 — ABNORMAL HIGH (ref 7.250–7.300)
pO2, Ven: 32.7 mmHg (ref 30.0–45.0)

## 2011-03-21 LAB — CARBOXYHEMOGLOBIN
Carboxyhemoglobin: 1.1 % (ref 0.5–1.5)
Carboxyhemoglobin: 1.1 % (ref 0.5–1.5)
Methemoglobin: 1.8 % — ABNORMAL HIGH (ref 0.0–1.5)
Methemoglobin: 1.8 % — ABNORMAL HIGH (ref 0.0–1.5)
O2 Saturation: 64.3 %
O2 Saturation: 73.9 %
Total hemoglobin: 11.5 g/dL — ABNORMAL LOW (ref 13.5–18.0)
Total hemoglobin: 11.5 g/dL — ABNORMAL LOW (ref 13.5–18.0)

## 2011-03-21 LAB — MAGNESIUM: Magnesium: 1.7 mg/dL (ref 1.5–2.5)

## 2011-03-21 LAB — LIPASE, BLOOD
Lipase: 12 U/L (ref 11–59)
Lipase: 21 U/L (ref 11–59)

## 2011-03-21 LAB — LACTIC ACID, PLASMA: Lactic Acid, Venous: 2 mmol/L (ref 0.5–2.2)

## 2011-03-21 LAB — DIFFERENTIAL
Basophils Absolute: 0 10*3/uL (ref 0.0–0.1)
Basophils Relative: 0 % (ref 0–1)
Eosinophils Absolute: 0 10*3/uL (ref 0.0–0.7)
Eosinophils Relative: 0 % (ref 0–5)
Lymphocytes Relative: 3 % — ABNORMAL LOW (ref 12–46)
Lymphs Abs: 0.2 10*3/uL — ABNORMAL LOW (ref 0.7–4.0)
Monocytes Absolute: 0.3 10*3/uL (ref 0.1–1.0)
Monocytes Relative: 4 % (ref 3–12)
Neutro Abs: 8 10*3/uL — ABNORMAL HIGH (ref 1.7–7.7)
Neutrophils Relative %: 94 % — ABNORMAL HIGH (ref 43–77)

## 2011-03-21 LAB — BLOOD GAS, ARTERIAL
Acid-base deficit: 4.7 mmol/L — ABNORMAL HIGH (ref 0.0–2.0)
Bicarbonate: 19.1 mEq/L — ABNORMAL LOW (ref 20.0–24.0)
Drawn by: 309361
O2 Content: 2 L/min
O2 Saturation: 96.8 %
Patient temperature: 98.3
TCO2: 17.4 mmol/L (ref 0–100)
pCO2 arterial: 32.8 mmHg — ABNORMAL LOW (ref 35.0–45.0)
pH, Arterial: 7.383 (ref 7.350–7.450)
pO2, Arterial: 119 mmHg — ABNORMAL HIGH (ref 80.0–100.0)

## 2011-03-21 LAB — URINE CULTURE
Colony Count: NO GROWTH
Culture: NO GROWTH
Special Requests: NEGATIVE

## 2011-03-21 LAB — AMYLASE: Amylase: 36 U/L (ref 27–131)

## 2011-03-21 LAB — KETONES, QUALITATIVE: Acetone, Bld: NEGATIVE

## 2011-03-21 LAB — PHOSPHORUS: Phosphorus: 3.8 mg/dL (ref 2.3–4.6)

## 2011-03-21 LAB — CORTISOL: Cortisol, Plasma: 14.6 ug/dL

## 2011-04-19 NOTE — Discharge Summary (Signed)
Shane [name], GORRELL NO.:  [id number]   MEDICAL RECORD NO.:  [id number]          PATIENT TYPE:  OBV   LOCATION:  4702                         FACILITY:  [facility]   PHYSICIAN:  [name], M.D.  DATE OF BIRTH:  03-15-1941   DATE OF ADMISSION:  09/16/2007  DATE OF DISCHARGE:  09/17/2007                               DISCHARGE SUMMARY   DISCHARGE SUMMARY AND ADMISSION NOTE:   DIAGNOSES AT THE TIME OF DISCHARGE:  1. Vertigo with intractable nausea and vomiting, peripheral versus      central vestibular dysfunction.  2. Questionable vertebrobasilar insufficiency.  3. Diabetes mellitus type 2, fair control.  4. Essential hypertension.  5. Hyperlipidemia.  6. Gastroesophageal reflux disease.  7. Obstructive sleep apnea.   HISTORY OF PRESENT ILLNESS:  Mr. Souder is a very pleasant, well-known 70-  year-old of mine with diabetes mellitus type 2 which is reasonably well-  controlled on a pump was in recent times, hyperlipidemia,  osteoarthritis, hypertension and sleep apnea, presenting with profuse  and intractable nausea, vomiting and vertigo.  He had one similar  episode approximately 1-2 years ago, which was self-limiting, and he was  seen as recently in the office as October 8 with dizziness, nausea and  vomiting, treated as an outpatient.  It was the impression at that time  that it was either some labyrinthitis or vestibulitis and he was  medicated with some meclizine and Phenergan with some good results.  He  presents via EMS at this time at approximately 5 p.m. on Sunday after  sudden onset one and a half hours ago of dizziness with protracted  nausea, vomiting and now dry heaves.  He was unable to ambulate, whether  the weakness or gait instability, unclear, and therefore EMS was  summoned.  In the emergency room he was pale, ashen, heaving, and so  weak that he was barely able to converse.  He was clearly unable to  ambulate and to the extent that we were  able to obtain a history, he  certainly denied any chest pain or shortness of breath.  He had no  fever, chills or night sweats and aura or warning prior to this episode.  He is seen at this time with concerns regarding a cerebellar infarct and  Code Stroke was called.  He had an emergent MRI and MRA with no evidence  of evolving stroke and was seen emergently by Dr. Vickey Huger in a very  timely fashion as well and beyond some diffuse myoclonic activity,  nothing focal.  He was admitted for further stabilization and further  studies.   PAST MEDICAL HISTORY:  No known drug allergies except intolerance to  codeine.   Medications upon presentation:  1. Actos 45 mg daily.  2. Lipitor 10 mg daily.  3. Aspirin 81 mg daily.  4. Lisinopril 40 mg daily.  5. Insulin via insulin pump.  6. He is also on some Protonix 40 mg daily.   Medical illnesses:  1. Diabetes mellitus type 2.  2. Hyperlipidemia.  3. Hypertension.  4. Sleep apnea.  5. Irritable bowel  syndrome.  6. Gastroesophageal reflux disease.   Surgical illnesses:  1. Lumbar laminectomy in 2002 for spinal stenosis.  2. Colonoscopy in 2002 and 2008.  3. Circumcision.  4. He has had prior MRIs of the brain and Cardiolite testing as well.   FAMILY HISTORY:  Noncontributory.   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  He has two children, two grandchildren.  Does not smoke or  drink.  He is very physically active.   PHYSICAL EXAM:  Temperature was 98, blood pressure was 130/80, pulse was  100 and regular, respiratory rate is 18 and unlabored, weight 260  pounds.  He is pale, ashen, sitting up heaving, diffusely weak.  Does have some  diffuse myoclonic activity, upper and lower extremities, left greater  than right-sided.  HEENT: Anicteric.  Pupils round and reactive.  Extraocular movements  intact.  Oropharynx is benign.  NECK:  No JVD or bruits.  LUNGS:  Clear.  CARDIOVASCULAR:  Regular rate  and rhythm, distant.  No murmur, gallop,  rub or heave.  ABDOMEN:  Soft, nontender, good bowel sounds.  EXTREMITIES:  No cyanosis, clubbing or edema.  Intact pulses.  Joints  normal.  Diffusely weak.  Moves extremities x4.  Reflexes symmetric.  Toes downgoing, no clonus.   DATA:  MRI of the brain:  No acute infarcts.  MRA:  Mild diffuse  disease, questionable basilar stenosis.  EKG:  Normal sinus rhythm, not  acute.  Chest x-ray:  Chronic changes, no CHF.  Carotid Dopplers:  No  obstructive lesions, vertebrals antegrade.  Cardiac enzymes negative.  CBC normal.  Creatinine 1.1.   HOSPITAL COURSE:  The patient was admitted, medicated with Zofran, a  loading dose of Depakote for myoclonic activity, Ativan, within 8-12  hours stabilized.  By the next morning he was back to baseline, normal  gait, some subjective dizziness, no further nausea and vomiting.  Neurologic exam remained stable.  Cardiac rhythm remained stable.  Blood  pressure stable.  CBCs in the 100s.  Workup as above was completed and  he is discharged home back to baseline with no evidence of evolving  stroke or suspicion for imminent stroke or obstructive carotid disease.  Aspirin was increased, otherwise medications were resumed and resumption  of insulin pump.  Discharged in improved and stable condition.  Considerations for outpatient would be CT angio of the vertebrobasilar  system to rule out vertebrobasilar insufficiency.  If this is negative,  by default this must be a peripheral vestibular process and  vestibular rehab is probably what is indicated.  Dr. Vickey Huger did see  the patient consultation and support the decisions throughout this  period.  Continued management of his diabetes, lipids and hypertension  essential and reaffirmed.           ______________________________  [name], M.D.     RA/MEDQ  D:  09/19/2007  T:  09/20/2007  Job:  [id number]   cc:   Melvyn Novas, M.D.

## 2011-04-19 NOTE — H&P (Signed)
NAME:  Shane [name], Shane [name] NO.:  [id number]   MEDICAL RECORD NO.:  [id number]          PATIENT TYPE:  EMS   LOCATION:  ED                           FACILITY:  [facility]   PHYSICIAN:  Kari Baars, M.D.  DATE OF BIRTH:  [birth year]   DATE OF ADMISSION:  12/05/2008  DATE OF DISCHARGE:                              HISTORY & PHYSICAL   CHIEF COMPLAINT:  Nausea and vomiting and  hyperglycemia.   HISTORY OF PRESENT ILLNESS:  Shane [name] is a 70 year old white male with  a history of type 2 diabetes on an insulin pump, hypertension, and  chronic intermittent severe vertigo who presented to the emergency  department with greater than a 12-hour history of protracted nausea,  vomiting and vertigo.  The patient has a history of recurrent vertigo  and has undergone an extensive evaluation of these severe episodes with  MRI/MRA of the brain, CT angiogram of the head and neck, neurological  and ENT evaluations in both [place] and at [facility].  These have not been inclusive in terms of  underlying etiology of his vertigo.  However, he has continued to have  episodes of severe vertigo similar to this episode.  However, typically,  these episodes last less than 6 hours.  He has not had a severe episode  in the past 4-5 months but prior to that was having more frequent  episodes.  Last night around 9 p.m. 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  This has been  projectile at times and has been disabling and severe.  He subsequently  developed diarrhea this morning.  He did discontinue his insulin pump  last evening when his blood sugar was 192 due to his concern of  hypoglycemia with his illness.  His sugar was markedly increased this  morning at more than 500.  Due to persistent nausea and vomiting and  vertigo, he contacted EMS  who brought him to the emergency department  where he was found to have a blood pressure of 87/37 and blood sugar of  430.   REVIEW OF SYSTEMS:  All systems reviewed with the patient and are  negative except in the HPI.  He denies any chest pain, shortness of  breath, fevers, chills or sweats.  He did hit his left hand last night  when he fell from the bed and is having some discomfort of his hand.  Otherwise, normal.   PAST MEDICAL HISTORY:  1. Type 2 diabetes on an insulin pump with marginal control.  2. Hypertension.  3. Hyperlipidemia.  4. Gastroesophageal reflux disease.  5. Obstructive sleep apnea on CPAP.  6. 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  7. History of allergic rhinitis/asthma.  8. Status post lumbar surgery.   CURRENT MEDICATIONS:  1. Maxzide 25/37.5 daily.  2. Bystolic 10 mg daily.  3. Lisinopril 40 mg daily.  4. Aspirin 81  mg daily.  5. Lipitor 40 mg daily.  6. Humalog insulin pump basal settings are 12 a.m. to 8 a.m. 3.1 units      per hour, 8 a.m. to 7 p.m. 2.4 units per hour, 7 p.m. until      midnight 2.6 units per hour.  He also gives himself a bolus of 60      units in the morning, 20 units in the midday and 20 units in the      evening.  His total daily dose over the past 3 days has been 104.2,      114, and 116 units.   ALLERGIES:  CODEINE.   SOCIAL HISTORY:  He is married with 2 children.  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  No tobacco or alcohol or drug use.   FAMILY HISTORY:  Father had COPD.  There is also diabetes in the family.   PHYSICAL EXAM:  Temperature 98.9, blood pressure initially 87/37,  subsequently 100/29, pulse 99, respirations 36, oxygen saturation 97% on  room air.  GENERAL:  An acutely ill obese gentleman who is shaking his extremities.  He does have horizontal nystagmus when he opens his eyes up and becomes  severely nauseated when sitting up.  He  is unable to perform a Hallpike-  Dix maneuver secondary to severe nausea.  HEENT:  He has no scleral icterus.  Oropharynx is moist.  NECK:  Supple without lymphadenopathy, JVD or carotid bruits.  HEART:  Regular rate and rhythm without murmurs, rubs or gallops.  LUNGS:  Clear to auscultation bilaterally.  ABDOMEN:  Soft, nondistended,  nontender with decreased bowel sounds.  He does have some minimal tenderness over the left upper quadrant.  EXTREMITIES:  No clubbing, cyanosis or edema.  Left fingers are swollen  with no ecchymosis.  NEUROLOGIC:  He has severe ataxia, difficulty touching his nose.  He has  nystagmus of both eyes with movement of his head or eyes in any  direction.  No clonus.  No focal weakness.  Deep tendon reflexes are 1+  and symmetric.   LABS:  CBC shows a white count of 8.6, hemoglobin 12.6, platelets 199.  BMET is significant for sodium 133, potassium 6.2, chloride 97, bicarb  20, BUN 61, creatinine 2.8, glucose 430, anion gap is 16.  Liver  function tests are normal.  Lipase 21.  Venous blood gas shows a pH of  7.31, pCO2 40, and bicarb 20.   ASSESSMENT/PLAN:  1. Protracted nausea and vomiting and severe vertigo:  I suspect it is      recurrent vertigo as the cause of his severe nausea and vomiting.      He has undergone an extensive workup in the past which I have      discussed with Dr. Jacky Kindle.  At this time we will admit and treat      him supportively with IV fluids, hydration, antiemetics and Ativan      as needed.  Once he is more stable, consider Epley maneuvers or      vertigo physical therapy to see if this will help in preventing      recurrence.  Do not plan to repeat a workup at this time unless his      symptoms fail to improve or unless he develops new symptoms.  2. Positive anion gap metabolic acidosis:  This is consistent with      mild DKA although he is a type 2 diabetic.  This was precipitated      by insulin cessation during his acute  illness which he has been      advised against.  Urine and serum ketones are pending.  Will admit      to step-down status per [facility] protocol for diabetic      ketoacidosis and IV fluids.  His diarrhea may also be contributing      to his acidosis as well.  3. Acute renal failure secondary to #1 with hyperkalemia:  Hold      Maxzide and ACE inhibitor.  Hydrate      aggressively.  His hyperkalemia should improve with correction of      acidosis and diarrhea.  Will hold off on additional treatment at      this time and monitor closely.  4. Hypertension secondary to above:  Monitor with hydration and hold      antihypertensives.      Kari Baars, M.D.  Electronically Signed     WS/MEDQ  D:  12/05/2008  T:  12/05/2008  Job:  [id number]   cc:   [name], M.D.  Fax: [phone]

## 2011-04-19 NOTE — Consult Note (Signed)
NAME:  Shane, [name] NO.:  [id number]   MEDICAL RECORD NO.:  [id number]          PATIENT TYPE:  EMS   LOCATION:  MAJO                         FACILITY:  [facility]   PHYSICIAN:  Shane [name], M.D.  DATE OF BIRTH:  1941/04/10   DATE OF CONSULTATION:  DATE OF DISCHARGE:                                 CONSULTATION   Mr. Shane [name] is a 70 year old Caucasian, married right-handed gentleman  working in [employer], whose  primary care physician is Dr. Jacky [name].  The patient complained about acute onset of generalized weakness,  vomiting, and severe vertigo, at approximately 3:00 p.m. today.  The  patient was brought by ambulance and greeted in the ER by Dr. Jacky [name],  then Dr. Jacky [name] informed me about the physical symptoms at about 4:00  p.m. today, and he discussed to call a code stroke, as the patient  seemed to have cerebellar or brain stem symptoms predominantly.  I later  canceled the code stroke after the evaluation, see annotation.   This gentleman was found by his wife, sitting in a chair at about 3:00  p.m.  He had called out to her, but then was unable to respond to her  when she was found him sitting.  He appeared to have stared off for a  while, and she was very concerned because he told her this under  shortness of breath that, in case something that happens to him, she  should call their attorney.  He had severe vertigo but did not state it  at the time, and he also states that his arms felt numb.  The wife  called EMS and has also noted her husband became very anxious and  tachycardiac and more tachypneci.  At the time of his arrival in the ER,  the patient had vertigo, nausea and had vomited.   PAST MEDICAL HISTORY:  Diabetes mellitus, hypertension, obesity,  hyperlipidemia, obstructive sleep apnea.  The patient was recently  started on meclizine by Dr. Carmon Ginsberg, and he took amoxicillin for a upper  respiratory tract infection that had not responded to  anti-allergy  medications.   FAMILY HISTORY:  Noncontributory.   SOCIAL HISTORY:  The patient is married, works full-time, [employer].  He is driving.  He is independent in all activities of daily living,  nonsmoker, nondrinker.   PAST MEDICAL HISTORY:  As above.   CURRENT MEDICATIONS:  Meclizine, amoxicillin.   ALLERGIES:  THERE IS NO KNOWN DRUG ALLERGIES.   PHYSICAL EXAMINATION:  VITAL SIGNS:  Blood pressure today 137/65, pulse  rate is between 68 and 78, regular normal sinus rhythm.  Respiratory  rate is 20.  LUNGS:  Clear to auscultation.  The patient has no bruit, no murmur.  ABDOMEN:  No abdominal pain, no flank pain.  Normal bowel sounds.  EXTREMITIES:  Delayed peripheral capillary refill time, but peripheral  pulses are palpable.  No edema, no cyanosis noted.  The patient seemed  to have developed some left dominant myoclonic twitching before I  evaluated him and had already received 2 mg of Ativan IV, which had  decreased the movements.  NEURO:  He appeared drowsy but arousable, alert and oriented x3.  The  patient was able to respond to commands, oriented to left and right  side, date, time.  His cranial nerve examination shows pupils reacting  equal to light, but sluggishly so.  The patient did not suffer from any  nystagmus by the extraocular movement examination.  He felt, however,  increasingly nauseated when sitting up, trying to lift his head, or when  following a moving object with his gaze, especially in the horizontal  plane.  He denied diplopia.  He had no facial asymmetry or sensory loss.  Motor examination showed in the periphery equal grip strength.  Deep  tendon reflexes were attenuated.  He has downgoing Babinski responses to  repeated stimulation.  He can lift all extremities anti-gravity.  Sensory examination:  The patient was able to distinct pinprick and fine  touch in his arms, also he states there is a tingling numbness that  involves both upper  extremities.  He does not have the tingling numbness  in his face, so vibration and fine touch were decreased in both feet.  Finger-nose test showed bilateral dysmetria, but not ataxia.  No tremor.  Gait had to be deferred.   ASSESSMENT:  Acute severe vertigo without gaze deviation and without  provocation of nystagmus.  However, the patient was unable to sit up,  due to the severe nausea.  He denied diplopia.  The MRI was performed  with a diffusion-weighted image study, and it showed no diffusion-  weighted image lesions.  Therefore, an acute stroke was not present and  called the code stroke off.  In the differential, we consider  vestibulitis.  In addition, the patient received 1 gram of Depakote IV,  which helped him with his symptoms and also suppressed the jerks.   PLAN:  The patient will receive a benzodiazepine for vertigo  suppression, about 2 mg t.i.d. p.o.  Dr. Jacky [name] already worked for  Zofran, to suppress the nausea.  He should be evaluated by physical  therapy for vestibular rehab program, and I have ordered a transcranial  and carotid Doppler for tomorrow, in case the MRI is of limited value.  Otherwise, we cannot definitely cancel the test.  A C-spine MRI would be  beneficial to evaluate why the upper extremity numbness in the symmetric  fashion with existent.   It was my great pleasure to see this gentleman, and thank Dr. Jacky [name]  for the consultation.      Shane [name], M.D.  Electronically Signed     CD/MEDQ  D:  09/16/2007  T:  09/17/2007  Job:  [id number]

## 2011-04-22 NOTE — Discharge Summary (Signed)
Shane [name], Shane NO.:  [id number]   MEDICAL RECORD NO.:  [id number]          PATIENT TYPE:  INP   LOCATION:  5702                         FACILITY:  [facility]   PHYSICIAN:  Kari Baars, M.D.  DATE OF BIRTH:  11/21/1941   DATE OF ADMISSION:  02/12/2006  DATE OF DISCHARGE:  02/14/2006                                 DISCHARGE SUMMARY   DISCHARGE DIAGNOSES:  1.  Nausea/vomiting, likely secondary to viral gastroenteritis.  2.  Mild diarrhea secondary to mild gastroenteritis.  3.  Syncope secondary to #1.  4.  Type 2 diabetes mellitus on insulin pump.  5.  Hyperlipidemia.  6.  Osteoarthritis.  7.  allergies/asthma.  8.  Gastroesophageal reflux disease.  9.  Status post lumbar surgery (2002).  10. Status post left wrist surgery.  11. Status post knee arthroscopy.  12. Obstructive sleep apnea.   DISCHARGE MEDICATIONS:  1.  Phenergan 25 milligrams q.6h. p.r.n. nausea.  2.  Zofran 4 milligrams q6h. p.r.n. nausea if Phenergan does not work.  3.  Actos 40 milligrams daily to resume in one day.  4.  Lipitor 10 milligrams daily to resume in three to four days when better.  5.  Insulin pump resumed prior to discharge.  6.  Spiriva inhaler daily.  7.  Singulair 10 milligrams daily.  8.  Aspirin 81 milligrams daily.  9.  AcipHex 20 milligrams daily.  10. Zelnorm 60 milligrams daily.   HOSPITAL PROCEDURES:  Abdominal and pelvis CT normal with no acute findings.   HISTORY OF PRESENT ILLNESS:  For full details, please see dictated history  and physical by Dr. Jacky Kindle. Briefly, Shane [name] is a pleasant 70 year old  white male with type 2 diabetes mellitus on insulin pump, hypertension,  hyperlipidemia who presented to the emergency department on February 12, 2006  following an episode of nausea and vomiting followed by syncope while at the  movie theaters. While at the movie, the patient had an episode of vomiting.  He felt lightheaded following the episode. He felt  that he may have been  hypoglycemic. He was given some cookies to eat. When EMS arrived and stood  him up, he stated that he became diaphoretic and then had passed out. He  denied chest pain, palpitations, headache.   In the emergency department, his temperature was 101.5, blood pressure  132/70, pulse 100. Chest x-ray and EKG showed no acute findings. Cardiac  enzymes were negative. Given his syncopal versus near syncopal episode, the  patient was admitted for further management for hydration, with his nausea  and vomiting.   HOSPITAL COURSE:  The patient was admitted to a telemetry bed. Cardiac  enzymes remained negative. He subsequently had several episodes of nausea  and vomiting and ensuing diarrhea. CT scan of abdomen and pelvis were  performed and showed no acute findings. There was no evidence of  diverticulitis or active infection. With supportive therapy including IV  fluids and Zofran, the patient's nausea and vomiting improved. He was  tolerating p.o.'s. He was transitioned back onto his insulin pump prior to  discharge. His  felt that his syncope/near syncope was due to orthostasis or  vasovagal changes in the setting of his nausea and vomiting. No further  cardiac or neurologic workup was felt necessary. The patient remained stable  for discharge on February 14, 2006.   DISCHARGE INSTRUCTIONS:  The patient was instructed to call if he had  persistent nausea and vomiting and inability to tolerate p.o.'s. He should  call if he has chest pain or another syncopal episode. He will be monitoring  his sugars closely back on his insulin pump and will call if they are more  than 300. He is very comfortable with his insulin pump and bolusing as  needed.   DISPOSITION:  To home with his wife.      Kari Baars, M.D.  Electronically Signed     WS/MEDQ  D:  02/15/2006  T:  02/16/2006  Job:  [id number]   cc:   Shane [name], M.D.  Fax: [phone]

## 2011-04-22 NOTE — Op Note (Signed)
[facility]  Patient:    KILLIAN, SCHWER                     MRN: [id number] Proc. Date: 01/29/01 Adm. 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  OPERATION: 1. Lumbar decompression at L2-3, L3-4, L4-5 with central laminectomies at L3    and L4, L2, and L5. 2. Bilateral foraminotomies at L2, L3, L4, and L5. 3. Partial medial hemifacetectomies at L2-3, L3-4, and L4-5.  SURGEON:  Javier Docker, M.D.  ASSISTANT:  Kerrin Champagne, M.D.  ANESTHESIA:  General.  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  The patient had bilateral symptomatology, neurogenic in nature.  MRI and myelogram indicate stenosis of 2-3, 3-4, and at 4-5.  The patient had no neural tension sign or evidence of aesthetic neural compression; however the patient had significant claudication.  It was felt that decompression at the above-mentioned levels would alleviate his neurogenic claudication; however, persistent back pain would be present due to multilevel lumbar spondylosis.  Disk protrusions at multiple level felt secondary to degenerative protrusions.  Noncompressive disk was noted at 5-1. Operative intervention was indicated for decompression at the regions of the greatest severity in order to help alleviate his neurogenic claudication. Risks and benefits of the procedure were discussed including bleeding, infection, damage to neurovascular structures, CSF leakage, epidural fibrosis, cauda equina, hematoma, need for fusion, recurrent stenosis, etc.  TECHNIQUE:  The patient was placed in the supine position after induction of adequate general anesthesia with 1 g  Kefzol.  The patient was placed on the [place] frame.  All bony prominences were well padded.  The lumbar region was prepped and draped in the usual sterile fashion.  Two 18-gauge spinal needles were utilized to demarcate the extent of the incision.  Incision was made from the spinous process of L2 to the spinous process at S1.  Subcutaneous tissue was dissected.  Electrocautery was utilized to achieve hemostasis.   The dorsal lumbar fascia was identified and divided in line with the skin incision.  Paraspinous muscles were elevated from the lamina of L2, 3, 4, and 5 bilaterally.  Retractors were placed and x-rays taken of the spinous process of 3 and 4.  Paraspinous muscles again elevated form the lamina at 2, 3, 4, and 5 bilaterally.  McCullough retractor was placed.  Next, a rongeur was utilized to remove the spinous processes of 3 and 4, and a partial spinous process removal of 2 and of 5.  Next, a central decompression was accomplished utilizing a combination of 2 and 3 mm Kerrisons.  The partial laminectomy of 5 was performed, detaching the cephalad edge of ligamentum flavum.  The ligamentum flavum was then removed.  At the interspace of 4-5, significant stenosis was noted.  Central laminectomy was performed through 3 and through the ligamentum flavum hypertrophy at 3-4.  Significant stenosis was noted at 3-4.  Finally, central laminectomies at 3 and 4; this was carried up into the 2-3 space.  There was significant ligamentum flavum hypertrophy and the central stenosis noted.  Partial laminectomy of 2 was performed  in order to fully remove the ligamentum flavum from the interspace.  The stenosis was noted to be most severe at 3-4 and at 2-3.  Next, then, a high speed bur was utilized  perform medial hemifacetectomies at 2-3, 3-4, and at 4-5, protecting neural elements at all times.  With the neural elements well protected, the partial medial hemifacetectomies were completed with 2  and 3 mm Kerrisons bilaterally at 2-3, 3-4, and 4-5.  Next, with the neural elements well protected, foraminotomies were performed at 2, 3, 4, and 5 bilaterally.  Significant stenosis was noted at 3 and 4 bilaterally, left greater than right.  Electrocautery was utilized to achieve strict hemostasis.  A hard disk was appreciated on both sides at 2-3, 3-4, and at 4-5 with no evidence of a soft extruded disk herniation compressing the nerve roots.  The hockey stick probe placed down into the foramen of 5 found it to be widely patent bilaterally as was 4, 3, and 2.  This was following the decompression.  Again, the lateral recess stenosis was most notable at 3-4 and 2-3 bilaterally, left greater than right.   Following decompression, there was good expansion of the thecal sac.  It was pulsatile with no evidence of CSF leakage or active bleeding.  The hockey stick probe was again placed cephalad underneath the remaining 2 lamina, and the remaining 5 lamina found to be widely patent cephalad to caudad.  The foramen at 2, 3, 4, and 5 bilaterally were widely patent following the decompression.  Next, the wound was then copiously irrigated, again inspected with no evidence of active bleeding or CSF leakage.  Bone wax was placed at the hemifacetectomy sites.  Thrombin-soaked Gelfoam was placed in the laminectomy defects.  A J-P drain was placed and brought out through a separate stab wound in the paraspinous musculature and skin on the right.  McCullough retractor was removed.  The paraspinous muscle inspected, and electrocautery utilized to achieve hemostasis.  The paraspinous musculature was closed in layers with 0 Vicryl simple sutures.  The dorsal lumbar fascia was reapproximated watertight with #1 Vicryl interrupted figure-of-eight sutures.  Subcutaneous tissue was reapproximated with 2-0 Vicryl simple sutures.  Skin was reapproximated with staples.  J-P was connected.  Prior to the final  closure, a final radiograph was obtained indicating the satisfactory extent of the decompression.  Next, the patient was placed supine on the hospital bed, extubated without  difficulty, and transported to the recovery room in satisfactory condition. The patient tolerated the procedure well without complication.  Estimated blood loss 100 cc. DD:  01/29/01 TD:  01/30/01 Job: [id number]

## 2011-04-22 NOTE — Assessment & Plan Note (Signed)
[facility]                           GASTROENTEROLOGY OFFICE NOTE   Shane [name], MOHS                     MRN:          [id number]  DATE:10/20/2006                            DOB:          29-May-1941    REFERRING PHYSICIAN:  [name], M.D.   REASON FOR CONSULTATION:  Surveillance colonoscopy.   HISTORY:  This is a pleasant 70 year old white male with history of diabetes  mellitus, hyperlipidemia, and sleep apnea who is referred through the  courtesy of Dr. Jacky Kindle regarding surveillance colonoscopy.  The patient  initially underwent colonoscopy in 2002.  At that time, he was found to have  colon polyps and mild diverticulosis.  Polyps were both adenomatous and  hyperplastic.  He was seen again in 2003 for problems with diarrhea.  He  underwent upper endoscopy and colonoscopy.  Biopsies of the colon and  duodenum were taken.  There was no evidence of sprue or microscopic colitis.  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  He is now due  for followup.  He has been felt to have irritable bowel.  Overall he has  been doing well except for some intermittent constipation.  His GI review of  systems is otherwise negative.  He does have an insulin pump which is new  since his last exam.   CURRENT MEDICATIONS:  1. Actos 45 mg q.a.m.  2. Lipitor 10 mg daily.  3. Singulair 10 mg daily.  4. Aspirin 81 mg daily.  5. Insulin pump.   PHYSICAL EXAMINATION:  Well-appearing male in no acute distress.  Blood  pressure 122/70.  Heart rate is 80 and regular.  Weight is 266 pounds.  He  is 6 feet in height.  HEENT:  Sclerae are anicteric.  Conjunctiva are pink.  Oral mucosa intact.  LUNGS:  Clear.  HEART:  Regular.  ABDOMEN:  Soft, without tenderness, mass or hernia.   IMPRESSION:  1. History of adenomatous colon polyps.  Due for surveillance.  2. History of irritable bowel.  Currently stable.  3. History of diverticulosis.  4. General medical problems including insulin-requiring diabetes.   RECOMMENDATIONS:  1. Colonoscopy with polypectomy if indicated.  Ashby Dawes of the procedure as      well as the risks, benefits and alternatives have been reviewed.  He      understood and agreed to proceed.  2. Hold oral hypoglycemic agent as well as insulin pump the morning of his      exam.  3. Continue general medical care with Dr. Jacky Kindle.     Wilhemina Bonito. Eda Keys., MD  Electronically Signed    JNP/MedQ  DD: 10/20/2006  DT: 10/20/2006  Job #: [id number]   cc:   [name], M.D.

## 2011-04-22 NOTE — Discharge Summary (Signed)
NAMEDENT, PLANTZ NO.:  [id number]   MEDICAL RECORD NO.:  [id number]          PATIENT TYPE:  INP   LOCATION:  1304                         FACILITY:  [facility]   PHYSICIAN:  [name], M.D.  DATE OF BIRTH:  08-05-1941   DATE OF ADMISSION:  12/05/2008  DATE OF DISCHARGE:  12/08/2008                               DISCHARGE SUMMARY   DIAGNOSES OF THE TIME OF DISCHARGE:  1. Sepsis syndrome with fever, hypertension, renal failure, and      acidosis.  2. Probable aspiration pneumonia.  3. Acute renal failure secondary to prerenal azotemia.  4. Episodic vertigo and ataxia.  5. Diabetes mellitus, type 2, insulin dependent.  6. Essential hypertension.  7. Hyperlipidemia.   HISTORY OF PRESENT ILLNESS:  Mr. Shane [name] is a very pleasant 70 year old  gentleman well known to myself with diabetes mellitus, type 2, on an  insulin pump, hypertension, hyperlipidemia, gastroesophageal reflux  disease, obstructive sleep apnea, and a chronic paroxysmal vertiginous  syndrome presenting at this time with progressive nausea, vomiting,  hypotension, and acidosis.  He is chronically on an insulin pump with  hypertension, hyperlipidemia with fair control.  For the last 2 years,  he has had episodic vertiginous-type episodes some of which quite severe  and at least one of which resulting in extensive cerebrovascular workup.  In fact, within the last 12 months, he has had an MRI/MRA of the brain,  CT angio of the head and neck, neurologic/ENT evaluations, and [facility] evaluation for these vestibular-type events.  They are almost  uniformly characterized by a sensation of vertigo, protracted nausea and  vomiting, significant weakness, and myoclonic activity.  Workup as  mentioned above has traditionally been negative and our focus has been  on diabetic control, as well as the recent addition of a beta-blocker to  the extent that perhaps this was a migraine equivalent.  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  The only differentiation was diarrhea which was unique to  this episode.  Blood sugar was 192.  Pump was suspended but he presents  at this time with a blood sugar in excess of 500 and hypotensive with  systolic pressure in the 80s.  He had a significant acidosis and  markedly elevated BUN and creatinine; he is admitted at this time for  further management to include hydration, insulin, and treatment for  sepsis-like syndrome.  For further details, see the dictated summary on  the chart by Dr. Sherryll Burger.   DATA:  Initial blood gas, pH 7.31, pCO2 was 32, pO2 of 119, bicarb was  19.9.  CBC, hemoglobin 12.6, hematocrit 36.9, white blood count 8.6,  platelet count 199,000.  Chemistry, sodium 133, potassium 6.2 initially,  chloride 97, CO2 of 20, glucose 430, BUN 61, creatinine 2.8.  Repeat  chemistries prior to discharge, sodium 140, potassium 3.5, chloride 108,  BUN 16, creatinine 1.15, CO2 of  26, calcium is 8.3.  Liver functions,  protein 7.5, albumin 3.4, SGOT 30, SGPT 36, alk phos 59, bilirubin 1.0,  magnesium 1.7, amylase 36, lipase 21.  Serum acetone was negative.  CKs  were 11,865, 4805, ultimately prior to discharge 1597.  Urinalysis was  negative.  Blood cultures x2 were negative.  Urine culture was negative.  EKG, normal sinus rhythm, right bundle branch block, otherwise normal.  Chest CT, cardiac enlargement, coronary calcifications, interstitial  changes, questionable fibrotic, no focal consolidation.  Chest x-ray,  left lung interstitial or airspace disease, questionable aspiration.   HOSPITAL COURSE:  Patient was admitted, initially critical care  consulted and took primary responsibility given his hemodynamic  instability, acidosis.  From a diabetic standpoint,  he was placed on the  Glucommander protocol for IV insulin, blood sugars stabilized.  This was  not DKA as the acidosis was with a negative serum acetone.  He  ultimately was transitioned back to an insulin pump, did extremely well  with stable blood sugars.  From a blood pressure management, the CVP  line was placed.  He received aggressive crystalloid support.  Ultimately, blood pressure stabilized, urine improved, and renal  function normalized.  This appeared as a sepsis-like syndrome, perhaps  it was viral with the diarrhea, but regardless in addition to the  hemodynamic support he was pancultured, treated empirically with  vancomycin and Cipro and pulmonary toilet.  He stabilized fairly  quickly.  Cultures were negative.  He had been treated empirically with  steroids and these were discontinued.  Ultimately transitioned to the  floor and a regular carb-modified diet.  Central venous line was  discontinued.  Zosyn had been started and discontinued and baseline  medications restarted.  Prior to discharge, I did ask Dr. Corliss Skains of  invasive interventional radiology to review his prior CT angio  approximately 1 year ago and it was his feeling that this was pretty  clear cut with no opportunity for more aggressive intervention and no  structural vascular process to intervene upon.   Patient is discharged in improved and stable condition.  This was a  sepsis-like syndrome with the hypotension, metabolic acidosis, prerenal  azotemia, and with negative cultures may indeed have been viral.  While  some elements of the vestibular/vertiginous nature were present, the  diarrhea, the hemodynamic instability, and the metabolic derangements  were atypical of his prior episodes.  Regardless, he has stabilized with  aggressive interventions as described above.  He has been discharged on  his insulin pump which has been restarted and resumption of his  medications include Maxzide 25 daily,  Bystolic 10 daily, lisinopril 40  daily, aspirin 81 mg daily, and Lipitor 40 mg daily.  He is to check his  blood sugars 4 times daily, to come in weekly to the office for  interrogation of his pump and meter, and further adjustments.  He is to  slowly increase his activity as tolerated.           ______________________________  [name], M.D.     RA/MEDQ  D:  01/14/2009  T:  01/14/2009  Job:  [id number]

## 2011-04-22 NOTE — H&P (Signed)
Shane [name], Shane [name] NO.:  [id number]   MEDICAL RECORD NO.:  [id number]          PATIENT TYPE:  INP   LOCATION:  5524                         FACILITY:  [facility]   PHYSICIAN:  [name], M.D.  DATE OF BIRTH:  [birth year]   DATE OF ADMISSION:  02/12/2006  DATE OF DISCHARGE:                                HISTORY & PHYSICAL   CHIEF COMPLAINT:  Near-syncope, nausea and vomiting.   HISTORY OF PRESENT ILLNESS:  Shane [name] is a very pleasant 70 year old  gentleman with known diabetes mellitus type 2, essential hypertension,  hyperlipidemia, gastroesophageal reflux disease, presenting at this time  with nausea, vomiting, and near-syncope. The patient has been in excellent  health, recently started on an insulin pump in the last 4 weeks. His blood  sugars prior to that have been poorly controlled with a hemoglobin A1c of  9.1 January 03, 2006, but subsequent to which his average blood sugars have  been 140-160 and his pump was changed approximately 1 week ago. He was in  his normal state of health feeling quite well until going to a movie on day  of presentation. At approximately 4 p.m. he began feeling poorly in the  movie and he and his wife decided to leave early. In the lobby of the movie  theater he had a subsequent episode of vomiting, near-syncope, and weakness.  The attendants gave him two regular cokes and upon arrival of EMS 15 minutes  later his blood pressure was 140. It was not checked during the spell. Upon  attempting to get up and leave with EMS still present, he had another  episode of nausea and vomiting, became diaphoretic, and he was transported  to the hospital. In the emergency room he had continued to vomit and while  claiming to have felt low with no documented low and with vomiting and  nausea that is not characteristic of his lows, he is admitted for further  management. He has had no chest pain, relates some more abdominal cramping  but  nothing intense or specific. He has had no fever or diarrhea. He has had  no back pain. He has had no jaw or ear pain.   PAST MEDICAL HISTORY:  The patient is intolerant to CODEINE.   Medications include:  1.  Actos 45 mg daily.  2.  Lipitor 10 mg daily.  3.  Spiriva inhaler two puffs daily.  4.  Singulair 10 mg daily.  5.  Aspirin 81 mg daily.  6.  Zelnorm 60 mg daily.  7.  AcipHex 20 mg daily.  8.  Most recently, an insulin pump, currently on 1.4 units per hour with      boluses.   Medical illnesses include:  1.  Diabetes mellitus type 2.  2.  Hyperlipidemia.  3.  Osteoarthritis.  4.  Allergies/asthma.  5.  Gastroesophageal reflux disease.   Surgical illnesses and procedures include:  1.  Lumbar surgery in 2002 by Dr. Shelle Iron.  2.  Colonoscopy in 2002.  3.  Circumcision in the early 1990s.  4.  Remote left wrist surgery.  5.  Remote knee arthroscopy.  6.  CT spiral of the chest 2001 negative.  7.  MRI brain August 2001 normal.  8.  Abdominal CT October 2003:  Lumbar degenerative spine disease; otherwise      no acute disease or process.  9.  Abdominal ultrasound November 2003 unremarkable.  10. Sleep study compatible with sleep apnea.  11. Remote Cardiolite study that was negative.   FAMILY HISTORY:  Noncontributory.   SOCIAL HISTORY:  The patient is married, works as a Surveyor, minerals, has two  children. He does not smoke or drink.   PHYSICAL EXAMINATION:  VITAL SIGNS:  T-max equals 101.5, blood pressure is  132/70, pulse 100 and regular, respiratory rate is 18.  GENERAL:  The patient appears ill.  HEENT:  Anicteric, good facial symmetry.  NECK:  No JVD or bruits.  LUNGS:  Clear to auscultation and percussion.  CARDIOVASCULAR:  Regular rate and rhythm. No murmur, gallop, rub, or heave.  ABDOMEN:  Soft, bowel sounds normal. Mild to moderate left lower quadrant  tenderness. No rebound or guarding.  RECTAL:  Not done.  EXTREMITIES:  No cyanosis, clubbing or edema. Intact  distal pulses, warm  distally, no mottling. Skin turgor is excellent.  NEUROLOGIC:  High corticol functioning intact, no somnolence,  nonlateralizing.   Chest x-ray:  No acute disease, questionable basilar infiltrate versus  atelectasis. EKG normal, no change from baseline. Cardiac enzymes negative.   ASSESSMENT:  1.  Viral gastroenteritis versus smoldering diverticulitis, doubt cardiac.  2.  Diabetes mellitus type 2, on pump.  3 . Hyperlipidemia.  1.  Essential hypertension.   PLAN:  The patient is admitted for hydration, insulin coverage, further to  include antibiotics and CT scanning pending his course. Serial cardiac  enzymes to rule out cardiac etiology.           ______________________________  [name], M.D.     RA/MEDQ  D:  02/13/2006  T:  02/14/2006  Job:  [id number]

## 2011-04-22 NOTE — Discharge Summary (Signed)
[facility]  Patient:    Shane [name], Shane [name]                     MRN: [id number] Adm. Date:  [id number] Disc. Date: [id number] Attending:  Pierce Crane Dictator:   Alexzandrew L. Perkins, P.A.-C.                           Discharge Summary  ADMITTING DIAGNOSIS:  Lumbar spinal stenosis L3-L4.  DISCHARGE DIAGNOSES: 1. Spinal stenosis at L2-L3, L3-L4 and L4-L5. 2. Status post lumbar decompression at L2-L3, L3-L4 and L4-L5, with central    laminectomies at L3, L4, L5 and L2. 4. Status post bilateral foraminotomies at L2, L3, L4 and L5. 5. Status post partial medial hemifacetectomies at L2-L3, L3-L4 and L4-L5.  CONSULTATION:  Medical, Dr. Jacky Kindle.  PROCEDURE:  The patient was taken to the operating room on January 29, 2001, and underwent lumbar decompression at L2-L3, L3-L4, L4-L5 with central laminectomies at L2, L3, L4 and L5 with bilateral foraminotomies at L2, L3, L4 and L5 and also partial medial hemifesitectomies at L2-L3, L3-L4 and L4-L5. Surgeon Dr. Jene Every, assistant Dr. Vira Browns.  Surgery under general anesthesia.  HISTORY OF PRESENT ILLNESS:  The patient is a 70 year old male who had been seen by Dr. Shelle Iron for ongoing low back pain which radiates down into both legs.  He is a patient of Dr. [name] who was referred over for evaluation.  He has been seen in the office with a history of inability to ambulate after a certain length of time.  He has undergone myelogram which did show spinal stenosis at L2-L3 and L3-L4 and MRI which showed moderate severe stenosis at L4-L5 as well.  Due to these significant findings, it was felt he would benefit from undergoing surgery and was subsequently admitted to the hospital.  H&H preoperatively showed a hemoglobin of 15.0, hematocrit 43.9.  Postop H&H 13.4 and 38.8.  Last noted H&H at 12.3 and 35.2.  BMP on admission showed elevated glucose of 233.  Sodium dropped during the hospital  course, postoperatively down to 133, but came up to 136.  Glucose dropped postoperatively and was noted at last at 154.  Urinalysis on admission was positive for glucose with slightly higher specific gravity of 1.037 with rare epithelial cells.  The remaining urinalysis was negative with exception of positive uric acid crystals.  Blood group type O positive.  EKG date December 28, 1999, showed sinus tachycardia with occasional premature ventricular complexes.  Inferior infarct with age undetermined.  No significant changes with last tracing confirmed by Dr. Nanetta Batty.  X-rays dated January 29, 2001, these were taken intraoperatively for localization of the L2-L3 spinous process and L4-L5 spinous processes.  HOSPITAL COURSE:  The patient was admitted to [facility] and taken to the OR.  He underwent the above-stated procedure without complication.  The patient tolerated the procedure well and was later transferred to the recovery room and then to the orthopedic floor.  He continued postoperative care.  The patient was placed on PCA analgesics for pain control following surgery.  He was also placed on his insulin.  He was given 48 hours worth of IV Kefzol. Dr. Jacky Kindle put him on diabetic protocol postoperatively.  The patient had a fair amount of pain.  His hemoglobin remained stable after surgery.  Physical therapy was consulted to assist with gait training and ambulation.  The patient slowly  progressed well with physical therapy and by February 27, 200, he was doing quite well and discharged home.  DISPOSITION:  Discharged to home on January 31, 2001.  DISCHARGE MEDICATIONS: 1. Vicodin 5, #40, p.r.n. pain. 2. Robaxin 500 mg, #40, p.r.n. spasm. 3. Vitamin C 500 mg daily over-the-counter.  DIET:  Diabetic diet.  ACTIVITY:  Back precautions.  May shower five days after surgery.  Up as tolerated.  Walking for exercise.  FOLLOWUP:  Follow up 10 days from  surgery.  CONDITION ON DISCHARGE:  Improved. DD:  02/27/01 TD:  02/28/01 Job: [id number]

## 2011-04-22 NOTE — H&P (Signed)
[facility]  Patient:    Shane [name], Shane [name]                          MRN: [id number] Adm. Date:  01/29/01 Attending:  Javier Docker, M.D. Dictator:   Javier Docker, M.D. CC:         Richard A. Jacky Kindle, M.D., [address] [facility] [address], [place], [place] [address]                         History and Physical  DATE OF OFFICE VISIT HISTORY AND PHYSICAL:  January 22, 2001.  CHIEF COMPLAINT:  Low back pain and leg pain.  HISTORY OF PRESENT ILLNESS:  Patient is a 70 year old male who has been seen by Dr. Javier Docker for pain in the low back that radiates down into the lower legs.  This has been ongoing for some time now.  He is a patient of Dr. Pearletha Furl. Jacky Kindle and has been referred over for evaluation.  He has been seen in the office with a history of inability to ambulate after a certain length of time.  He has been seen and evaluated and has undergone a lumbar myelogram which did show some spinal stenosis at L2-3 and L3-4.  He was sent off for an MRI which did show moderate-to-severe stenosis at L4-5 as well. Due to the significant findings and his continued problems, it is felt he would benefit from undergoing surgical decompression.  Risks and benefits of the surgical procedure have been discussed with the patient and he has accepted these.  ALLERGIES:  No known drug allergies.  INTOLERANCES:  CODEINE causes nausea.  VICODIN sometimes causes nausea.  CURRENT MEDICATIONS 1. Insulin 70/30, 65 units in the a.m. and 35 units in the p.m. 2. Actos by mouth. 3. Lipitor by mouth. 4. Vioxx by mouth.  PAST MEDICAL HISTORY:  Diabetes, hypercholesterolemia, currently under control using Lipitor, and history of renal calculi.  PAST SURGICAL HISTORY:  Left wrist surgery and also a knee arthroscopy.  SOCIAL HISTORY:  Patient is married and has two children.  Denies the use of tobacco or alcohol products.  He works in Chartered certified accountant.  FAMILY HISTORY:  Father deceased at age 93, with a history of smoking and emphysema.  Mother living, age 44, in good health.  Diabetes does run in the family.  REVIEW OF SYSTEMS:  GENERAL:  No fevers, chills or night sweats.  NEUROLOGIC: No seizures, syncope or paralysis; however, he does have some leg weakness due to the spinal stenosis in the lumbar region, with also some intermittent paresthesias.  RESPIRATORY:  No shortness of breath, productive cough or hemoptysis.  CARDIOVASCULAR:  No chest pain, angina or orthopnea.  GI:  No nausea, vomiting, diarrhea or constipation.  GU:  No dysuria, hematuria or discharge.  MUSCULOSKELETAL:  Pertinent to the back with symptoms in the left leg more so than the right leg.  PHYSICAL EXAMINATION  VITAL SIGNS:  Pulse 96, respirations 12, blood pressure 128/74.  GENERAL:  Patient is a 70 year old white male, a large-framed individual, slightly overweight, well-nourished, well-developed, who appeared to be in no acute distress.  He is alert, oriented and cooperative at the time of exam.  HEENT:  Normocephalic, atraumatic.  Pupils are round and reactive.  Oropharynx is clear.  NECK:  Supple.  CHEST:  Somewhat of a barrel-type chest.  Clear to auscultation.  No  rhonchi or rales.  HEART:  Regular rate and rhythm.  No murmurs.  S1 and S2 noted.  No rubs, thrills or palpitations.  ABDOMEN:  Soft, slightly protuberant abdomen.  Bowel sounds are present.  No rebound or guarding.  RECTAL:  Not done, not pertinent to present illness.  BREASTS:  Not done, not pertinent to present illness.  GENITALIA:  Not done, not pertinent to present illness.  EXTREMITIES:  Motor function of the lower extremities bilaterally is intact. He does have positive straight leg raises to the left and right with reproduction of some paresthesias.  He is slightly hyperreflexic on lower extremities with some decreased sensation on exam.  Negative  Babinskis.  IMPRESSION:  Lumbar spinal stenosis, L3-4, L4-5.  PLAN:  Patient was admitted to [facility] to undergo a lumbar decompression at L3-4 and L4-5, with possible L2-3.  Surgery will be performed by Dr. Javier Docker.  Patient has been seen preoperatively by Dr. Othelia Pulling and has been cleared for surgery.  Dr. Jacky Kindle will be notified of the room number and admission and will be consulted if needed for medical assistance with this patient throughout the hospital course. DD:  01/24/01 TD:  01/25/01 Job: [id number]

## 2011-09-13 ENCOUNTER — Encounter (HOSPITAL_COMMUNITY)
Admission: RE | Admit: 2011-09-13 | Discharge: 2011-09-13 | Disposition: A | Discharge status: Home / Self Care | Payer: Medicare Other | Source: Ambulatory Visit | Attending: Neurosurgery | Admitting: Neurosurgery

## 2011-09-13 ENCOUNTER — Other Ambulatory Visit (HOSPITAL_COMMUNITY): Payer: Self-pay | Admitting: Neurosurgery

## 2011-09-13 DIAGNOSIS — M5020 Other cervical disc displacement, unspecified cervical region: Secondary | ICD-10-CM

## 2011-09-13 DIAGNOSIS — M47812 Spondylosis without myelopathy or radiculopathy, cervical region: Secondary | ICD-10-CM

## 2011-09-13 LAB — BASIC METABOLIC PANEL
BUN: 27 mg/dL — ABNORMAL HIGH (ref 6–23)
CO2: 24 mEq/L (ref 19–32)
Calcium: 9.3 mg/dL (ref 8.4–10.5)
Chloride: 102 mEq/L (ref 96–112)
Creatinine, Ser: 1.58 mg/dL — ABNORMAL HIGH (ref 0.50–1.35)
GFR calc Af Amer: 49 mL/min — ABNORMAL LOW (ref >90)
GFR calc non Af Amer: 43 mL/min — ABNORMAL LOW (ref >90)
Glucose, Bld: 140 mg/dL — ABNORMAL HIGH (ref 70–99)
Potassium: 4.4 mEq/L (ref 3.5–5.1)
Sodium: 136 mEq/L (ref 135–145)

## 2011-09-13 LAB — SURGICAL PCR SCREEN
MRSA, PCR: NEGATIVE
Staphylococcus aureus: POSITIVE — AB

## 2011-09-13 LAB — TYPE AND SCREEN
ABO/RH(D): O POS
Antibody Screen: NEGATIVE

## 2011-09-13 LAB — CBC
HCT: 37.5 % — ABNORMAL LOW (ref 39.0–52.0)
Hemoglobin: 13.2 g/dL (ref 13.0–17.0)
MCH: 31.1 pg (ref 26.0–34.0)
MCHC: 35.2 g/dL (ref 30.0–36.0)
MCV: 88.2 fL (ref 78.0–100.0)
Platelets: 188 10*3/uL (ref 150–400)
RBC: 4.25 MIL/uL (ref 4.22–5.81)
RDW: 12.7 % (ref 11.5–15.5)
WBC: 5.9 10*3/uL (ref 4.0–10.5)

## 2011-09-13 LAB — ABO/RH: ABO/RH(D): O POS

## 2011-09-15 LAB — CARDIAC PANEL(CRET KIN+CKTOT+MB+TROPI)
CK, MB: 3.5
CK, MB: 3.7
Relative Index: 0.9
Relative Index: 1.1
Total CK: 344 — ABNORMAL HIGH
Total CK: 376 — ABNORMAL HIGH
Troponin I: 0.01
Troponin I: 0.02

## 2011-09-15 LAB — COMPREHENSIVE METABOLIC PANEL
ALT: 29
ALT: 33
AST: 23
AST: 25
Albumin: 2.9 — ABNORMAL LOW
Albumin: 3.2 — ABNORMAL LOW
Alkaline Phosphatase: 67
Alkaline Phosphatase: 74
BUN: 20
BUN: 21
CO2: 24
CO2: 26
Calcium: 8.4
Calcium: 8.6
Chloride: 103
Chloride: 105
Creatinine, Ser: 1.01
Creatinine, Ser: 1.14
GFR calc Af Amer: >60
GFR calc Af Amer: >60
GFR calc non Af Amer: >60
GFR calc non Af Amer: >60
Glucose, Bld: 155 — ABNORMAL HIGH
Glucose, Bld: 184 — ABNORMAL HIGH
Potassium: 3.7
Potassium: 4.1
Sodium: 133 — ABNORMAL LOW
Sodium: 138
Total Bilirubin: 0.6
Total Bilirubin: 0.7
Total Protein: 6.4
Total Protein: 7.2

## 2011-09-15 LAB — CBC
HCT: 36.3 — ABNORMAL LOW
HCT: 37.8 — ABNORMAL LOW
Hemoglobin: 12.2 — ABNORMAL LOW
Hemoglobin: 13.1
MCHC: 33.7
MCHC: 34.8
MCV: 89.2
MCV: 90.9
Platelets: 267
Platelets: 275
RBC: 3.99 — ABNORMAL LOW
RBC: 4.24
RDW: 12.6
RDW: 12.9
WBC: 10
WBC: 7.1

## 2011-09-15 LAB — DIFFERENTIAL
Basophils Absolute: 0
Basophils Relative: 1
Eosinophils Absolute: 0.3
Eosinophils Relative: 4
Lymphocytes Relative: 29
Lymphs Abs: 2.1
Monocytes Absolute: 0.9 — ABNORMAL HIGH
Monocytes Relative: 12 — ABNORMAL HIGH
Neutro Abs: 3.9
Neutrophils Relative %: 54

## 2011-09-15 LAB — APTT: aPTT: 27

## 2011-09-15 LAB — TROPONIN I: Troponin I: 0.01

## 2011-09-15 LAB — CK TOTAL AND CKMB (NOT AT ARMC)
CK, MB: 2
Relative Index: 1.6
Total CK: 127

## 2011-09-15 LAB — PROTIME-INR
INR: 1
Prothrombin Time: 13.5

## 2011-09-19 ENCOUNTER — Inpatient Hospital Stay (HOSPITAL_COMMUNITY): Payer: Medicare Other

## 2011-09-19 ENCOUNTER — Inpatient Hospital Stay (HOSPITAL_COMMUNITY)
Admission: RE | Admit: 2011-09-19 | Discharge: 2011-09-23 | DRG: 473 | Disposition: A | Discharge status: Home / Self Care | Payer: Medicare Other | Source: Ambulatory Visit | Attending: Neurosurgery | Admitting: Neurosurgery

## 2011-09-19 DIAGNOSIS — K219 Gastro-esophageal reflux disease without esophagitis: Secondary | ICD-10-CM | POA: Diagnosis present

## 2011-09-19 DIAGNOSIS — E119 Type 2 diabetes mellitus without complications: Secondary | ICD-10-CM | POA: Diagnosis present

## 2011-09-19 DIAGNOSIS — Z01812 Encounter for preprocedural laboratory examination: Secondary | ICD-10-CM

## 2011-09-19 DIAGNOSIS — E7800 Pure hypercholesterolemia, unspecified: Secondary | ICD-10-CM | POA: Diagnosis present

## 2011-09-19 DIAGNOSIS — I129 Hypertensive chronic kidney disease with stage 1 through stage 4 chronic kidney disease, or unspecified chronic kidney disease: Secondary | ICD-10-CM | POA: Diagnosis present

## 2011-09-19 DIAGNOSIS — M4712 Other spondylosis with myelopathy, cervical region: Principal | ICD-10-CM | POA: Diagnosis present

## 2011-09-19 DIAGNOSIS — Z01818 Encounter for other preprocedural examination: Secondary | ICD-10-CM

## 2011-09-19 DIAGNOSIS — Z7982 Long term (current) use of aspirin: Secondary | ICD-10-CM

## 2011-09-19 DIAGNOSIS — Z9641 Presence of insulin pump (external) (internal): Secondary | ICD-10-CM

## 2011-09-19 DIAGNOSIS — G4733 Obstructive sleep apnea (adult) (pediatric): Secondary | ICD-10-CM | POA: Diagnosis present

## 2011-09-19 DIAGNOSIS — N189 Chronic kidney disease, unspecified: Secondary | ICD-10-CM | POA: Diagnosis present

## 2011-09-19 DIAGNOSIS — Z79899 Other long term (current) drug therapy: Secondary | ICD-10-CM

## 2011-09-19 LAB — GLUCOSE, CAPILLARY
Glucose-Capillary: 153 mg/dL — ABNORMAL HIGH (ref 70–99)
Glucose-Capillary: 207 mg/dL — ABNORMAL HIGH (ref 70–99)
Glucose-Capillary: 261 mg/dL — ABNORMAL HIGH (ref 70–99)
Glucose-Capillary: 239 mg/dL — ABNORMAL HIGH (ref 70–99)
Glucose-Capillary: 148 mg/dL — ABNORMAL HIGH (ref 70–99)

## 2011-09-20 LAB — GLUCOSE, CAPILLARY
Glucose-Capillary: 120 mg/dL — ABNORMAL HIGH (ref 70–99)
Glucose-Capillary: 133 mg/dL — ABNORMAL HIGH (ref 70–99)
Glucose-Capillary: 142 mg/dL — ABNORMAL HIGH (ref 70–99)
Glucose-Capillary: 143 mg/dL — ABNORMAL HIGH (ref 70–99)
Glucose-Capillary: 144 mg/dL — ABNORMAL HIGH (ref 70–99)
Glucose-Capillary: 170 mg/dL — ABNORMAL HIGH (ref 70–99)

## 2011-09-21 LAB — GLUCOSE, CAPILLARY
Glucose-Capillary: 119 mg/dL — ABNORMAL HIGH (ref 70–99)
Glucose-Capillary: 117 mg/dL — ABNORMAL HIGH (ref 70–99)
Glucose-Capillary: 124 mg/dL — ABNORMAL HIGH (ref 70–99)
Glucose-Capillary: 120 mg/dL — ABNORMAL HIGH (ref 70–99)
Glucose-Capillary: 118 mg/dL — ABNORMAL HIGH (ref 70–99)
Glucose-Capillary: 151 mg/dL — ABNORMAL HIGH (ref 70–99)

## 2011-09-21 NOTE — Op Note (Signed)
NAMENOEH, SPARACINO NO.:  [id number]  MEDICAL RECORD NO.:  [id number]  LOCATION:  3112                         FACILITY:  [facility]  PHYSICIAN:  [name], M.D.DATE OF BIRTH:  October 07, 1941  DATE OF PROCEDURE:  09/19/2011 DATE OF DISCHARGE:                              OPERATIVE REPORT   PREOPERATIVE DIAGNOSES: 1. Cervical stenosis. 2. Cervical spondylosis with myelopathy. 3. Cervical degenerative disease with myelopathy.  POSTOPERATIVE DIAGNOSES: 1. Cervical stenosis. 2. Cervical spondylosis with myelopathy. 3. Cervical degenerative disease with myelopathy.  PROCEDURE:  C3-4, C4-5, C5-6 and C6-7 anterior cervical decompression, arthrodesis with allograft and tether cervical plating.  SURGEON:  [name], MD.  ASSISTANT:  Hilda Lias, MD  ANESTHESIA:  General endotracheal.  INDICATIONS:  A 70 year old man who presented with myelopathy who was found on MRI scan to have a 4-level advanced degenerative disk disease and spondylosis with resulting stenosis and evidence of increased signal within the spinal cord at several levels.  Decision was made to proceed with 4-level anterior cervical decompression and arthrodesis.  PROCEDURE:  The patient was brought to operative room, placed under general endotracheal anesthesia.  The patient was placed in 10 pounds of Holter traction.  Neck was prepped with Betadine soap and solution, draped in a sterile fashion.  The midline incision was infiltrated local anesthetic with epinephrine and an incision made in an oblique fashion paralleling the anterior border of the left sternocleidomastoid.  The dissection was carried down to the subcutaneous tissue and platysma. Bipolar cautery and electrocautery were used to maintain hemostasis. Dissection was carried out through the avascular plane in the sternocleidomastoid, carotid artery and jugular vein laterally and trachea and esophagus medially.  The  ventral aspect of the vertebral column was identified and x-ray was taken for localization, identified C3-4, C4-5, C5-6, and C6-7 intervertebral disk spaces.  At each level, the anterior longitudinal ligament was incised.  There was significant central calcifications and large osteophytes were removed as well.  Each of the disk spaces were entered and diskectomy initiated with microcurettes and pituitary rongeurs.  We then draped the operating microscope, brought into the field for additional navigation, illumination, and visualization.  The remainder of the decompression was performed using microdissection and microsurgical technique.  At each level, the cartilaginous endplate was carefully removed using microcurettes along with the high-speed drill and then posterior osteophytic overgrowth was removed using the high-speed drill along with 2 mm and 3 mm thin foot-plated Kerrison punches.  There was significant spondylitic overgrowth at each level that was carefully removed taking care to decompress the spinal canal and thecal sac and the neural foramina.  Once decompression was completed, hemostasis was established using Gelfoam soaked in thrombin.  We measured the height of the intervertebral disk space and selected allograft implants for each level using 7-mm allograft implants at C3-4, C4-5, and C5-6 levels and 8-mm allograft implant at the C6-7 level.  We then selected an 80-mm tether cervical plate.  It was positioned over the fusion construct and secured to the vertebra with screws.  We used a pair of 4 x 14 mm variable screws at C3, single 4 x 14 mm fixed screw  at C4, a single 4 x 15 mm variable screw at C5, a single 4 x 14 mm fixed screw at C6 and a pair of 4 x 14 mm variable screws at C7.  Once all 7 screws were in place, final tightening was performed with the final tightener.  An x-ray was taken which showed the graft, plate and screws in good position from C3 to C5.  Below  that level, the shoulders obscured vision of the C5-6 and C6-7 levels.  However, under direct visualization the fusion construct appeared good.  The wound was irrigated with saline solution and bacitracin solution.  Hemostasis was established and confirmed, and then we proceeded with closure.  The platysma was closed with interrupted inverted 2-0 undyed Vicryl sutures. Subcutaneous and subcuticular closed with inverted 3-0 undyed Vicryl sutures.  Skin was approximated with Dermabond.  Procedure was tolerated the well.  The estimated blood loss was 150 mL. Sponge and needle count correct.  Following surgery, the patient was taken out of traction, to be reversed from the anesthetic, extubated, and transferred to the recovery room for further care.     [name], M.D.     RWN/MEDQ  D:  09/19/2011  T:  09/20/2011  Job:  [id number]  Electronically Signed by Shirlean Kelly M.D. on 09/21/2011 01:53:27 PM

## 2011-09-22 ENCOUNTER — Inpatient Hospital Stay (HOSPITAL_COMMUNITY): Payer: Medicare Other

## 2011-09-22 LAB — GLUCOSE, CAPILLARY
Glucose-Capillary: 109 mg/dL — ABNORMAL HIGH (ref 70–99)
Glucose-Capillary: 118 mg/dL — ABNORMAL HIGH (ref 70–99)
Glucose-Capillary: 125 mg/dL — ABNORMAL HIGH (ref 70–99)
Glucose-Capillary: 127 mg/dL — ABNORMAL HIGH (ref 70–99)
Glucose-Capillary: 130 mg/dL — ABNORMAL HIGH (ref 70–99)
Glucose-Capillary: 131 mg/dL — ABNORMAL HIGH (ref 70–99)

## 2011-09-23 LAB — GLUCOSE, CAPILLARY
Glucose-Capillary: 121 mg/dL — ABNORMAL HIGH (ref 70–99)
Glucose-Capillary: 128 mg/dL — ABNORMAL HIGH (ref 70–99)
Glucose-Capillary: 130 mg/dL — ABNORMAL HIGH (ref 70–99)

## 2011-09-23 NOTE — H&P (Signed)
[name], Shane [name] NO.:  [id number]  MEDICAL RECORD NO.:  [id number]  LOCATION:  3533                         FACILITY:  [facility]  PHYSICIAN:  [name], M.D.DATE OF BIRTH:  November 28, 1941  DATE OF ADMISSION:  09/19/2011 DATE OF DISCHARGE:                             HISTORY & PHYSICAL   HISTORY OF PRESENT ILLNESS:  The patient is a 70 year old right-hand white male who was evaluated for cervical stenosis secondary to spondylosis and degenerative disease with radiologic evidence of myelopathy.  The patient explained that he had noticed numbness in his left forearm and hand developing over the past 8-10 months and gradually worsening. He has also had pain in and around the left shoulder for more than a year.  It is often worse with movement and is often uncontrolled when he tries to lay down at night.  He has little in the way of neck discomfort, some stiffness as he works at the computer too long.  He has not had any right upper extremity symptoms.  He underwent a workup including MRI scans of the left shoulder and of the cervical spine.  The left shoulder MRI showed significant degenerative changes including tendinitis and a complete tear of the long biceps tendon.  His cervical MRI showed advanced multilevel degenerative disk disease and spondylosis involving 4 levels including C3-C4, C4-C5, C5-C6, and C6-C7 with significant cervical stenosis worse at the C3-C4, C4-C5, and C5-C6 levels with evidence of increased signal within the spinal cord at the C3-C4 and C4-C5 levels consistent with myelopathy.  After evaluation, decision was made to proceed with decompression and stabilization.  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  He does not have any history of  myocardial infarction, cancer, stroke, or lung disease.  PAST SURGICAL HISTORY:  Trigger finger surgery in March 2011, lumbar laminectomy and decompression of spinal stenosis by Dr. Shelle Iron in 1996, left knee arthroscopy by Dr. Thurston Hole in 1994, cardiac workup with Dr. Clarene Duke in March 2012 with a negative cardiac catheterization.  ALLERGIES:  He has no allergies to medications but an intolerance to CODEINE which causes nausea.  MEDICATIONS: 1. Humalog insulin pump. 2. Crestor 5 mg daily. 3. Lisinopril 40 mg daily. 4. Bystolic 10 mg daily. 5. Triamterene/hydrochlorothiazide 37.5/25 daily. 6. Pantoprazole 40 mg daily. 7. Aspirin 81 mg daily. 8. Multivitamin daily. 9. Valium 5 mg 1-3 times per day p.r.n. 10.Promethazine p.r.n. 11.Nitrostat p.r.n. 12.CPAP for obstructive sleep apnea.  FAMILY HISTORY:  Mother died at age 17.  She had diabetes and Alzheimer's.  Father died at 18 and he had heart disease.  SOCIAL HISTORY:  The patient is married and he works as a Product/process development scientist.  His wife works at [employer].  He does not smoke, drink alcoholic beverages, or have a history of substance abuse.  REVIEW OF SYSTEMS:  Notable for those described in history of present illness and past history but 14-point review of systems is otherwise unremarkable.  PHYSICAL EXAMINATION:  GENERAL:  The  patient is a well-developed, well- nourished white male in no acute distress. VITAL SIGNS:  Height is 5 feet 11 inches, weight 118 kg, temperature is 97, pulse 64, blood pressure 144/70, respiratory rate 20. LUNGS:  Clear to auscultation.  He has symmetric respiratory excursion.HEART:  Regular rate and rhythm with normal S1 and S2.  There is no murmur. EXTREMITIES:  No clubbing, cyanosis, or edema. MUSCULOSKELETAL:  On inspection of the cervical spinous process or paracervical musculature, he has a good range of motion of the neck to flexion, extension, and lateral flexion on either  side.  Some tenderness over the left acromioclavicular joint.  No tenderness over the right acromioclavicular joint.  No discomfort on testing for impingement on either shoulder. NEUROLOGIC:  5/5 strength to the upper extremities including the deltoids, biceps, triceps, intrinsic grips.  Sensation is intact to pinprick to the upper extremities.  Reflexes in the left biceps and brachialis are minimal.  The right biceps and brachialis are trace. Left triceps is 1, right triceps is 2.  Left quadriceps is trace, right quadriceps is 1.  Gastrocnemius were absent bilaterally.  Toes are equivocal to slightly downgoing bilaterally.  He has a normal gait and stance.  IMAGING:  X-rays done at my office show multilevel degenerative disk disease and spondylosis, C3-C4, C4-C5, C5-C6, and C6-C7.  IMPRESSION:  The patient with gradually worsening numbness in the left forearm and hand with radiologic evidence of cervical stenosis as well as increased signal in the spinal cord at multiple levels consistent with cervical myelopathy secondary to the stenosis which is secondary to spondylosis and degenerative disk disease.  Exam does not show evidence of hyperreflexia or pathologic reflexes, however, he does have longstanding diabetes that might alter some of that exam.  He does have a 10-year history of vertigo and he has significant degenerative changes in the left shoulder and rotator cuff with rotator cuff tendinitis.  He has a long biceps tendon and he has mechanical signs referable to the shoulder and certainly much of the discomfort of the shoulder may be arising from the degenerative changes presence.  PLAN:  The patient will be admitted for a 4-level C3-C4, C4-C5, C5-C6, and C6-C7 anterior cervical decompression arthrodesis with allograft and cervical plating.  I have discussed the nature of his condition, the nature of the surgical procedure, type of surgery, hospital stay, and overall  compressions and limitations.  Postoperatively, need for postop immobilization in an Aspen cervical collar and subsequently a soft cervical collar, and we discussed limitations during the postoperative period and risks including risks of infection, bleeding, possible need for transfusion, the risk of nerve dysfunction, pain, weakness, numbness, paresthesias, risk of spinal cord dysfunction, paralysis of all 4 limbs, and quadriplegia.  The risk of bowel and bladder dysfunction and risk of failure of the arthrodesis, possible need for further surgery, anesthetic risks, myocardial infarction, stroke, pneumonia, and death and risk of hoarseness of the voice or difficulty swallowing.  Understanding all these, he would like to go ahead with surgery and is admitted for such.     [name], M.D.     RWN/MEDQ  D:  09/22/2011  T:  09/22/2011  Job:  [id number]  Electronically Signed by Shirlean Kelly M.D. on 09/23/2011 12:46:26 PM

## 2011-09-27 ENCOUNTER — Emergency Department (HOSPITAL_COMMUNITY): Payer: Medicare Other

## 2011-09-27 ENCOUNTER — Inpatient Hospital Stay (HOSPITAL_COMMUNITY)
Admission: EM | Admit: 2011-09-27 | Discharge: 2011-10-01 | DRG: 471 | Disposition: A | Discharge status: Home / Self Care | Payer: Medicare Other | Source: Ambulatory Visit | Attending: Neurosurgery | Admitting: Neurosurgery

## 2011-09-27 DIAGNOSIS — E119 Type 2 diabetes mellitus without complications: Secondary | ICD-10-CM | POA: Diagnosis present

## 2011-09-27 DIAGNOSIS — E7800 Pure hypercholesterolemia, unspecified: Secondary | ICD-10-CM | POA: Diagnosis present

## 2011-09-27 DIAGNOSIS — E875 Hyperkalemia: Secondary | ICD-10-CM | POA: Diagnosis present

## 2011-09-27 DIAGNOSIS — G4733 Obstructive sleep apnea (adult) (pediatric): Secondary | ICD-10-CM | POA: Diagnosis present

## 2011-09-27 DIAGNOSIS — Z981 Arthrodesis status: Secondary | ICD-10-CM

## 2011-09-27 DIAGNOSIS — N182 Chronic kidney disease, stage 2 (mild): Secondary | ICD-10-CM | POA: Diagnosis present

## 2011-09-27 DIAGNOSIS — E871 Hypo-osmolality and hyponatremia: Secondary | ICD-10-CM | POA: Diagnosis present

## 2011-09-27 DIAGNOSIS — I129 Hypertensive chronic kidney disease with stage 1 through stage 4 chronic kidney disease, or unspecified chronic kidney disease: Secondary | ICD-10-CM | POA: Diagnosis present

## 2011-09-27 DIAGNOSIS — R1310 Dysphagia, unspecified: Secondary | ICD-10-CM | POA: Diagnosis present

## 2011-09-27 DIAGNOSIS — K219 Gastro-esophageal reflux disease without esophagitis: Secondary | ICD-10-CM | POA: Diagnosis present

## 2011-09-27 DIAGNOSIS — Z7982 Long term (current) use of aspirin: Secondary | ICD-10-CM

## 2011-09-27 DIAGNOSIS — M4712 Other spondylosis with myelopathy, cervical region: Principal | ICD-10-CM | POA: Diagnosis present

## 2011-09-27 DIAGNOSIS — Z794 Long term (current) use of insulin: Secondary | ICD-10-CM

## 2011-09-27 DIAGNOSIS — Z79899 Other long term (current) drug therapy: Secondary | ICD-10-CM

## 2011-09-27 DIAGNOSIS — G8250 Quadriplegia, unspecified: Secondary | ICD-10-CM | POA: Diagnosis present

## 2011-09-27 LAB — DIFFERENTIAL
Basophils Absolute: 0 10*3/uL (ref 0.0–0.1)
Basophils Relative: 0 % (ref 0–1)
Eosinophils Absolute: 0.2 10*3/uL (ref 0.0–0.7)
Eosinophils Relative: 2 % (ref 0–5)
Lymphocytes Relative: 13 % (ref 12–46)
Lymphs Abs: 1.4 10*3/uL (ref 0.7–4.0)
Monocytes Absolute: 1.4 10*3/uL — ABNORMAL HIGH (ref 0.1–1.0)
Monocytes Relative: 13 % — ABNORMAL HIGH (ref 3–12)
Neutro Abs: 7.9 10*3/uL — ABNORMAL HIGH (ref 1.7–7.7)
Neutrophils Relative %: 73 % (ref 43–77)

## 2011-09-27 LAB — URINALYSIS, ROUTINE W REFLEX MICROSCOPIC
Bilirubin Urine: NEGATIVE
Glucose, UA: NEGATIVE mg/dL
Hgb urine dipstick: NEGATIVE
Ketones, ur: NEGATIVE mg/dL
Leukocytes, UA: NEGATIVE
Nitrite: NEGATIVE
Protein, ur: NEGATIVE mg/dL
Specific Gravity, Urine: 1.016 (ref 1.005–1.030)
Urobilinogen, UA: 1 mg/dL (ref 0.0–1.0)
pH: 5.5 (ref 5.0–8.0)

## 2011-09-27 LAB — BASIC METABOLIC PANEL
BUN: 30 mg/dL — ABNORMAL HIGH (ref 6–23)
CO2: 26 mEq/L (ref 19–32)
Calcium: 9.8 mg/dL (ref 8.4–10.5)
Chloride: 89 mEq/L — ABNORMAL LOW (ref 96–112)
Creatinine, Ser: 1.56 mg/dL — ABNORMAL HIGH (ref 0.50–1.35)
GFR calc Af Amer: 50 mL/min — ABNORMAL LOW (ref >90)
GFR calc non Af Amer: 43 mL/min — ABNORMAL LOW (ref >90)
Glucose, Bld: 180 mg/dL — ABNORMAL HIGH (ref 70–99)
Potassium: 5.4 mEq/L — ABNORMAL HIGH (ref 3.5–5.1)
Sodium: 125 mEq/L — ABNORMAL LOW (ref 135–145)

## 2011-09-27 LAB — CBC
HCT: 32.8 % — ABNORMAL LOW (ref 39.0–52.0)
Hemoglobin: 11.6 g/dL — ABNORMAL LOW (ref 13.0–17.0)
MCH: 30.9 pg (ref 26.0–34.0)
MCHC: 35.4 g/dL (ref 30.0–36.0)
MCV: 87.5 fL (ref 78.0–100.0)
Platelets: 292 10*3/uL (ref 150–400)
RBC: 3.75 MIL/uL — ABNORMAL LOW (ref 4.22–5.81)
RDW: 12.2 % (ref 11.5–15.5)
WBC: 10.9 10*3/uL — ABNORMAL HIGH (ref 4.0–10.5)

## 2011-09-27 LAB — COMPREHENSIVE METABOLIC PANEL
ALT: 57 U/L — ABNORMAL HIGH (ref 0–53)
AST: 48 U/L — ABNORMAL HIGH (ref 0–37)
Albumin: 3 g/dL — ABNORMAL LOW (ref 3.5–5.2)
Alkaline Phosphatase: 78 U/L (ref 39–117)
BUN: 30 mg/dL — ABNORMAL HIGH (ref 6–23)
CO2: 27 mEq/L (ref 19–32)
Calcium: 9.7 mg/dL (ref 8.4–10.5)
Chloride: 89 mEq/L — ABNORMAL LOW (ref 96–112)
Creatinine, Ser: 1.55 mg/dL — ABNORMAL HIGH (ref 0.50–1.35)
GFR calc Af Amer: 51 mL/min — ABNORMAL LOW (ref >90)
GFR calc non Af Amer: 44 mL/min — ABNORMAL LOW (ref >90)
Glucose, Bld: 182 mg/dL — ABNORMAL HIGH (ref 70–99)
Potassium: 5.4 mEq/L — ABNORMAL HIGH (ref 3.5–5.1)
Sodium: 127 mEq/L — ABNORMAL LOW (ref 135–145)
Total Bilirubin: 0.5 mg/dL (ref 0.3–1.2)
Total Protein: 8 g/dL (ref 6.0–8.3)

## 2011-09-27 LAB — TYPE AND SCREEN
ABO/RH(D): O POS
Antibody Screen: NEGATIVE

## 2011-09-27 LAB — GLUCOSE, CAPILLARY
Glucose-Capillary: 136 mg/dL — ABNORMAL HIGH (ref 70–99)
Glucose-Capillary: 159 mg/dL — ABNORMAL HIGH (ref 70–99)

## 2011-09-27 NOTE — Discharge Summary (Signed)
[name], FOOT NO.:  [id number]  MEDICAL RECORD NO.:  [id number]  LOCATION:  3533                         FACILITY:  [facility]  PHYSICIAN:  [name], M.D.DATE OF BIRTH:  July 22, 1941  DATE OF ADMISSION:  09/19/2011 DATE OF DISCHARGE:  09/23/2011                              DISCHARGE SUMMARY   ADMISSION HISTORY AND PHYSICAL EXAMINATION:  The patient is a 70 year old man who presented with cervical myelopathy secondary to cervical stenosis secondary to cervical spondylosis and degenerative disk disease.  He did initially note numbness in the left forearm and hand, also some pain around the left shoulder.  He was worked up with MRI scan of the left shoulder and cervical spine.  The left shoulder MRI showed significant degenerative changes with tendinitis and a complete tear of the long biceps tendon and moderate chronic arthritic changes of the acromioclavicular joint.  Cervical MRI showed advanced multilevel degenerative disk disease and spondylosis at C3-4, C4-5, C5-6, C6-7 with significant cervical stenosis worse at the C3-4, C4-5, and C5-6 levels and evidence of increased signal within the spinal cord at the C3-4 and C4-5 levels consistent with myelopathy.  The patient was admitted for decompression and stabilization.  PAST MEDICAL HISTORY:  Notable for 1. Diabetes. 2. Hypertension. 3. Hypercholesterolemia. 4. Vertigo. 5. Gastroesophageal reflux disease.  MEDICATIONS:  Details of his medications were detailed in his admission history and physical note, but he was on an insulin pump at home.  FAMILY HISTORY:  Noted in the admission note.  SOCIAL HISTORY:  Noted in the admission note.  REVIEW OF SYSTEMS:  Noted in the admission note.  PHYSICAL EXAMINATION:  GENERAL:  Unremarkable. NEUROLOGIC:  Showed intact strength and sensation.  HOSPITAL COURSE:  The patient was admitted, underwent a 4 level C3-4, C4- 5, C5-6, and C6-7 anterior  cervical decompression arthrodesis with allograft and tether cervical plating.  He did well following surgery. He was initially monitored in the intensive care unit.  His blood sugar management was coordinated by his primary care doctor, Michail Jewels who saw him daily.  He was treated with Lantus 15 units at bedtime and sliding scale insulin q.4 hours, and his blood sugar control has been excellent through the hospitalization ranging from roughly 115-125.  His Foley was discontinued on the first postoperative day.  The patient has been voiding well, confirmed by bladder scan showing low postvoid residuals.  The patient was gradually mobilized and began ambulating in the ICU and then we will arrange for transfer to Neurosurgical Med Surg Unit.  The wound is healed well.  There is some bruising medial to the incision. He also developed some mild erythema across his upper chest.  He was started on antibiotics although he has been afebrile and this may be more of a heat rash than of an infectious etiology.  He has had varying discomfort in neck, upper back, or across shoulders.  We did check an x- ray yesterday prior to discharge which showed the grafts all in good position.  The plate and screws in good position.  The overall alignment is good and the overall appearance was excellent.  Dr. Lorain Childes is elected to  continue him on the Lantus at night and sliding scale insulin and his wife is going to stopped by Dr. Daine Gravel office to have supplies provided by the [employer].  They have not yet restarted the insulin pump, and his diabetic management will continued to be coordinated by Dr. Lorain Childes as an outpatient.  The patient does have obstructive sleep apnea and did use a CPAP machine throughout his hospitalization.  Blood pressures remained excellent through the hospitalization.  Wound is healing well, and he and his wife were given instructions on wound care.  He is also be given  instruction regarding activities following discharge.  We did give him a number prescriptions including a prescription for Dilaudid 1 mg tablets, he is taking 1 or 2 q.i.d. p.r.n. pain, we prescribed 100 tablets and no refills; also prescribed Flexeril 10 mg t.i.d. p.r.n. muscle spasms, 90 tablets 1 refill; and Keflex 500 mg t.i.d., we prescribed 20 tablets and no refills.  He is to continue all of his usual home meds except for his insulin pump and his insulin supplies will be provided by the [employer] at Dr. Daine Gravel office.  DISCHARGE DIAGNOSES:  Cervical stenosis, cervical spondylosis with myelopathy, cervical degenerative disk disease with myelopathy, diabetes, obstructive sleep apnea, and hypertension.  DISCHARGE INSTRUCTIONS:  The patient is to return for followup with me in about 3 weeks at my office or sooner if he has difficulties.     [name], M.D.     RWN/MEDQ  D:  09/23/2011  T:  09/23/2011  Job:  [id number]  Electronically Signed by Shirlean Kelly M.D. on 09/27/2011 10:08:49 AM

## 2011-09-28 LAB — BASIC METABOLIC PANEL
BUN: 30 mg/dL — ABNORMAL HIGH (ref 6–23)
BUN: 30 mg/dL — ABNORMAL HIGH (ref 6–23)
CO2: 25 mEq/L (ref 19–32)
CO2: 24 mEq/L (ref 19–32)
Calcium: 8.8 mg/dL (ref 8.4–10.5)
Calcium: 9.5 mg/dL (ref 8.4–10.5)
Chloride: 95 mEq/L — ABNORMAL LOW (ref 96–112)
Chloride: 94 mEq/L — ABNORMAL LOW (ref 96–112)
Creatinine, Ser: 1.2 mg/dL (ref 0.50–1.35)
Creatinine, Ser: 1.14 mg/dL (ref 0.50–1.35)
GFR calc Af Amer: 69 mL/min — ABNORMAL LOW (ref >90)
GFR calc Af Amer: 73 mL/min — ABNORMAL LOW (ref >90)
GFR calc non Af Amer: 60 mL/min — ABNORMAL LOW (ref >90)
GFR calc non Af Amer: 63 mL/min — ABNORMAL LOW (ref >90)
Glucose, Bld: 251 mg/dL — ABNORMAL HIGH (ref 70–99)
Glucose, Bld: 260 mg/dL — ABNORMAL HIGH (ref 70–99)
Potassium: 6 mEq/L — ABNORMAL HIGH (ref 3.5–5.1)
Potassium: 5.4 mEq/L — ABNORMAL HIGH (ref 3.5–5.1)
Sodium: 128 mEq/L — ABNORMAL LOW (ref 135–145)
Sodium: 130 mEq/L — ABNORMAL LOW (ref 135–145)

## 2011-09-28 LAB — GLUCOSE, CAPILLARY
Glucose-Capillary: 221 mg/dL — ABNORMAL HIGH (ref 70–99)
Glucose-Capillary: 239 mg/dL — ABNORMAL HIGH (ref 70–99)
Glucose-Capillary: 275 mg/dL — ABNORMAL HIGH (ref 70–99)
Glucose-Capillary: 280 mg/dL — ABNORMAL HIGH (ref 70–99)
Glucose-Capillary: 285 mg/dL — ABNORMAL HIGH (ref 70–99)

## 2011-09-28 LAB — DIFFERENTIAL
Basophils Absolute: 0 10*3/uL (ref 0.0–0.1)
Basophils Relative: 0 % (ref 0–1)
Eosinophils Absolute: 0 10*3/uL (ref 0.0–0.7)
Eosinophils Relative: 0 % (ref 0–5)
Lymphocytes Relative: 8 % — ABNORMAL LOW (ref 12–46)
Lymphs Abs: 0.8 10*3/uL (ref 0.7–4.0)
Monocytes Absolute: 0.6 10*3/uL (ref 0.1–1.0)
Monocytes Relative: 6 % (ref 3–12)
Neutro Abs: 9 10*3/uL — ABNORMAL HIGH (ref 1.7–7.7)
Neutrophils Relative %: 86 % — ABNORMAL HIGH (ref 43–77)

## 2011-09-28 LAB — CBC
HCT: 27.4 % — ABNORMAL LOW (ref 39.0–52.0)
Hemoglobin: 9.6 g/dL — ABNORMAL LOW (ref 13.0–17.0)
MCH: 30.3 pg (ref 26.0–34.0)
MCHC: 35 g/dL (ref 30.0–36.0)
MCV: 86.4 fL (ref 78.0–100.0)
Platelets: 245 10*3/uL (ref 150–400)
RBC: 3.17 MIL/uL — ABNORMAL LOW (ref 4.22–5.81)
RDW: 11.9 % (ref 11.5–15.5)
WBC: 10.4 10*3/uL (ref 4.0–10.5)

## 2011-09-29 LAB — CBC
HCT: 27.2 % — ABNORMAL LOW (ref 39.0–52.0)
Hemoglobin: 9.3 g/dL — ABNORMAL LOW (ref 13.0–17.0)
MCH: 29.5 pg (ref 26.0–34.0)
MCHC: 34.2 g/dL (ref 30.0–36.0)
MCV: 86.3 fL (ref 78.0–100.0)
Platelets: 316 10*3/uL (ref 150–400)
RBC: 3.15 MIL/uL — ABNORMAL LOW (ref 4.22–5.81)
RDW: 12.1 % (ref 11.5–15.5)
WBC: 16.6 10*3/uL — ABNORMAL HIGH (ref 4.0–10.5)

## 2011-09-29 LAB — DIFFERENTIAL
Basophils Absolute: 0 10*3/uL (ref 0.0–0.1)
Basophils Relative: 0 % (ref 0–1)
Eosinophils Absolute: 0 10*3/uL (ref 0.0–0.7)
Eosinophils Relative: 0 % (ref 0–5)
Lymphocytes Relative: 6 % — ABNORMAL LOW (ref 12–46)
Lymphs Abs: 1 10*3/uL (ref 0.7–4.0)
Monocytes Absolute: 1.1 10*3/uL — ABNORMAL HIGH (ref 0.1–1.0)
Monocytes Relative: 7 % (ref 3–12)
Neutro Abs: 14.4 10*3/uL — ABNORMAL HIGH (ref 1.7–7.7)
Neutrophils Relative %: 87 % — ABNORMAL HIGH (ref 43–77)

## 2011-09-29 LAB — BASIC METABOLIC PANEL
BUN: 29 mg/dL — ABNORMAL HIGH (ref 6–23)
CO2: 25 mEq/L (ref 19–32)
Calcium: 9.2 mg/dL (ref 8.4–10.5)
Chloride: 98 mEq/L (ref 96–112)
Creatinine, Ser: 1.06 mg/dL (ref 0.50–1.35)
GFR calc Af Amer: 80 mL/min — ABNORMAL LOW (ref >90)
GFR calc non Af Amer: 69 mL/min — ABNORMAL LOW (ref >90)
Glucose, Bld: 261 mg/dL — ABNORMAL HIGH (ref 70–99)
Potassium: 5.5 mEq/L — ABNORMAL HIGH (ref 3.5–5.1)
Sodium: 131 mEq/L — ABNORMAL LOW (ref 135–145)

## 2011-09-29 LAB — GLUCOSE, CAPILLARY
Glucose-Capillary: 262 mg/dL — ABNORMAL HIGH (ref 70–99)
Glucose-Capillary: 266 mg/dL — ABNORMAL HIGH (ref 70–99)
Glucose-Capillary: 296 mg/dL — ABNORMAL HIGH (ref 70–99)
Glucose-Capillary: 310 mg/dL — ABNORMAL HIGH (ref 70–99)
Glucose-Capillary: 312 mg/dL — ABNORMAL HIGH (ref 70–99)

## 2011-09-30 LAB — DIFFERENTIAL
Basophils Absolute: 0 10*3/uL (ref 0.0–0.1)
Basophils Absolute: 0 10*3/uL (ref 0.0–0.1)
Basophils Relative: 0 % (ref 0–1)
Basophils Relative: 0 % (ref 0–1)
Eosinophils Absolute: 0 10*3/uL (ref 0.0–0.7)
Eosinophils Absolute: 0 10*3/uL (ref 0.0–0.7)
Eosinophils Relative: 0 % (ref 0–5)
Eosinophils Relative: 0 % (ref 0–5)
Lymphocytes Relative: 9 % — ABNORMAL LOW (ref 12–46)
Lymphocytes Relative: 8 % — ABNORMAL LOW (ref 12–46)
Lymphs Abs: 1.1 10*3/uL (ref 0.7–4.0)
Lymphs Abs: 1.1 10*3/uL (ref 0.7–4.0)
Monocytes Absolute: 0.9 10*3/uL (ref 0.1–1.0)
Monocytes Absolute: 1 10*3/uL (ref 0.1–1.0)
Monocytes Relative: 7 % (ref 3–12)
Monocytes Relative: 7 % (ref 3–12)
Neutro Abs: 10.5 10*3/uL — ABNORMAL HIGH (ref 1.7–7.7)
Neutro Abs: 12.9 10*3/uL — ABNORMAL HIGH (ref 1.7–7.7)
Neutrophils Relative %: 84 % — ABNORMAL HIGH (ref 43–77)
Neutrophils Relative %: 86 % — ABNORMAL HIGH (ref 43–77)

## 2011-09-30 LAB — CBC
HCT: 25.3 % — ABNORMAL LOW (ref 39.0–52.0)
HCT: 27.3 % — ABNORMAL LOW (ref 39.0–52.0)
Hemoglobin: 8.9 g/dL — ABNORMAL LOW (ref 13.0–17.0)
Hemoglobin: 9.6 g/dL — ABNORMAL LOW (ref 13.0–17.0)
MCH: 30.3 pg (ref 26.0–34.0)
MCH: 30.4 pg (ref 26.0–34.0)
MCHC: 35.2 g/dL (ref 30.0–36.0)
MCHC: 35.2 g/dL (ref 30.0–36.0)
MCV: 86.1 fL (ref 78.0–100.0)
MCV: 86.4 fL (ref 78.0–100.0)
Platelets: 328 10*3/uL (ref 150–400)
Platelets: 388 10*3/uL (ref 150–400)
RBC: 2.94 MIL/uL — ABNORMAL LOW (ref 4.22–5.81)
RBC: 3.16 MIL/uL — ABNORMAL LOW (ref 4.22–5.81)
RDW: 12 % (ref 11.5–15.5)
RDW: 12 % (ref 11.5–15.5)
WBC: 12.5 10*3/uL — ABNORMAL HIGH (ref 4.0–10.5)
WBC: 15.1 10*3/uL — ABNORMAL HIGH (ref 4.0–10.5)

## 2011-09-30 LAB — GLUCOSE, CAPILLARY
Glucose-Capillary: 219 mg/dL — ABNORMAL HIGH (ref 70–99)
Glucose-Capillary: 283 mg/dL — ABNORMAL HIGH (ref 70–99)
Glucose-Capillary: 258 mg/dL — ABNORMAL HIGH (ref 70–99)
Glucose-Capillary: 293 mg/dL — ABNORMAL HIGH (ref 70–99)
Glucose-Capillary: 311 mg/dL — ABNORMAL HIGH (ref 70–99)
Glucose-Capillary: 262 mg/dL — ABNORMAL HIGH (ref 70–99)
Glucose-Capillary: 224 mg/dL — ABNORMAL HIGH (ref 70–99)
Glucose-Capillary: 313 mg/dL — ABNORMAL HIGH (ref 70–99)

## 2011-09-30 LAB — BASIC METABOLIC PANEL
BUN: 31 mg/dL — ABNORMAL HIGH (ref 6–23)
CO2: 26 mEq/L (ref 19–32)
Calcium: 8.9 mg/dL (ref 8.4–10.5)
Chloride: 95 mEq/L — ABNORMAL LOW (ref 96–112)
Creatinine, Ser: 1.05 mg/dL (ref 0.50–1.35)
GFR calc Af Amer: 81 mL/min — ABNORMAL LOW (ref >90)
GFR calc non Af Amer: 70 mL/min — ABNORMAL LOW (ref >90)
Glucose, Bld: 284 mg/dL — ABNORMAL HIGH (ref 70–99)
Potassium: 5 mEq/L (ref 3.5–5.1)
Sodium: 128 mEq/L — ABNORMAL LOW (ref 135–145)

## 2011-10-01 LAB — GLUCOSE, CAPILLARY
Glucose-Capillary: 244 mg/dL — ABNORMAL HIGH (ref 70–99)
Glucose-Capillary: 276 mg/dL — ABNORMAL HIGH (ref 70–99)

## 2011-10-03 ENCOUNTER — Encounter: Payer: Self-pay | Admitting: Internal Medicine

## 2011-10-03 NOTE — H&P (Signed)
Shane [name], BOUWENS NO.:  [id number]  MEDICAL RECORD NO.:  [id number]  LOCATION:  3108                         FACILITY:  [facility]  PHYSICIAN:  Shane [name], M.D.DATE OF BIRTH:  [birth year]  DATE OF ADMISSION:  09/27/2011 DATE OF DISCHARGE:                             HISTORY & PHYSICAL   HISTORY OF PRESENT ILLNESS:  The patient is a 70 year old right-handed white male who was readmitted after having been hospitalized 8 days ago for a 4 level C3-4, C4-5, C5-6, C6-7 anterior cervical decompression and arthrodesis with allograft and tethered cervical plating.  He was discharged on the fourth postoperative day and was doing well up until this morning when he felt that his lower extremities were weak.  He fell in the bathroom and could not get up from his knees.  His son has had to assist him up.  He stated his lower extremities felt weak and we spoke to he and his wife we instructed them to bring him to the emergency room, arrange for him to have laboratories done as well as x-rays and MRI scan of the cervical spine, and then evaluated him in the emergency room.  The cervical spine x-ray shows good anterior cervical fusion construct, all the grafts in good position, plates and screws were in good position, and the prevertebral swelling and edema that was seen in the initial postoperative x-ray has diminished.  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  It was felt that good decompression has been achieved ventrally but because of congenital narrowing and anterior posterior direction there was residual stenosis. The patient was admitted now for further treatment and care.  PAST MEDICAL HISTORY, FAMILY HISTORY, AND SOCIAL HISTORY:  Unchanged from this September 19, 2011, note other than for his Humalog insulin pump was stopped, and he was treated with Lantus 15 units at bedtime with sliding scale insulin.  Further he had  been sent home on Keflex 500 mg t.i.d.  REVIEW OF SYSTEMS:  Notable for those described above.  Also notable for residual mild dysphagia and also mild anorexia.  PHYSICAL EXAMINATION:  GENERAL:  Well-developed well-nourished white male in no acute distress. VITAL SIGNS:  Temperature 97.6, pulse 66, blood pressure 106/88. NECK:  Wound still has some bruising but is healing.  It is clean and dry.  He is in Aspen cervical collar.  There is no erythema, swelling, or drainage. LUNGS:  Clear to auscultation.  He has symmetric respiratory excursions. HEART:  Regular rate and rhythm without S1 and S2.  There is no murmur. ABDOMEN: Soft, nontender, and nondistended.  Bowel sounds are present. EXTREMITIES:  No clubbing, cyanosis, or edema. NEUROLOGIC:  Motor exam deltoid is 5 bilaterally, biceps is 4+ bilaterally.  Left triceps is 4, right triceps is 4-.  Intrinsics and grip are 4 bilaterally.  Iliopsoas the left is 4, the right is 4- and quadriceps, dorsalis, and plantar flexor are 5 bilaterally.  Sensation is diminished to pinprick in the lower extremities as compared to the upper extremities.  Reflexes in biceps and brachioradialis are minimal bilaterally.  Triceps and biceps are 3 bilaterally.  Gastrocnemius is minimal bilaterally.  Toes were weakly upgoing bilaterally.  Gait is not tested due to the nature of his condition.  IMPRESSION: 1. Cervical stenosis. 2. Cervical spondylosis with myelopathy. 3. Quadriparesis. 4. Diabetes. 5. Hypertension. 6. Hypercholesterolemia. 7. Gastroesophageal reflux disease. 8. Vertigo. 9. Obstructive sleep apnea.     Shane [name], M.D.     RWN/MEDQ  D:  09/27/2011  T:  09/28/2011  Job:  [id number]  cc:   [name]  Electronically Signed by Shirlean Kelly M.D. on 10/03/2011 09:81:19 AM

## 2011-10-03 NOTE — Op Note (Signed)
Shane [name], Shane [name] NO.:  [id number]  MEDICAL RECORD NO.:  [id number]  LOCATION:  3108                         FACILITY:  [facility]  PHYSICIAN:  [name], M.D.DATE OF BIRTH:  1941/01/01  DATE OF PROCEDURE:  09/27/2011 DATE OF DISCHARGE:                              OPERATIVE REPORT   PREOPERATIVE DIAGNOSIS:  Cervical stenosis, cervical spondylosis with myelopathy, quadriparesis.  POSTOPERATIVE DIAGNOSIS:  Cervical stenosis, cervical spondylosis with myelopathy, quadriparesis.  PROCEDURE:  C3 to C7 posterior cervical laminectomy, C3 to C7 posterior cervical arthrodesis with vertex lateral mass screws and rods, and locally harvested morselized autograft and Vitoss with microdissection and microsurgical technique.  SURGEON:  [name], MD  ASSISTANT:  Coletta Memos, MD  ANESTHESIA:  General endotracheal.  INDICATIONS:  The patient is a 70 year old man who is 8 days status post a 4-level C3-4, C4-5, C5-6 and C6-7 ACDF for cervical stenosis and myelopathy.  He did well following surgery, was discharged 4 days postoperatively.  He was doing well at home, but today found that his legs were weak.  He fell in the bathroom.  We had him come to the emergency room for evaluation.  Examination showed weakness in the biceps, triceps, intrinsics, grip, and iliopsoas bilaterally.  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  PROCEDURE:  The patient was brought to the operating room, placed under general endotracheal anesthesia.  The patient was placed on a 3-pin Mayfield headholder and turned to a prone position.  The posterior scalp was shaved and then the neck and posterior scalp and upper back were prepped with Betadine soap and solution, and draped in a sterile fashion.  The midline was infiltrated with local  anesthetic with epinephrine.  Midline incision was made, carried down through subcutaneous tissue.  Bipolar cautery and electrocautery were used to maintain hemostasis.  Dissection was carried down to the posterior cervical fascia which was incised bilaterally.  The paracervical musculature was dissected from spinous process and lamina in a subperiosteal fashion.  The C2 through C7 spinous process and lamina were identified and x-ray was taken to confirm the localization, and then we 1st placed lateral mass screws bilaterally from C3 to C7 at each level.  Pilot  holes were made with the high-speed drill.  We then drilled a 14 mm in depth hole into the lateral mass using an inferomedial posterior to superolateral anterior trajectory.  Each drill hole was examined with ball probe.  Good bony surfaces were found, each was tapped and then we placed 3.5 x 40 mm screws bilaterally at C3, C4, C5, C6, and C7.  Once all 10 screws were in place, we proceeded with a C3 to C7 laminectomy.  The spinous process was cleaned of soft tissue and then a double action rongeur  was used to remove the spinous process.  This bone was saved and morselized, and used as autograft.  We then carefully dissected the inferior laminar surface from the ligamentum flavum and  proceeded with a laminectomy using 2 mm and 3 mm Kerrison punches with  thin footplates.  Bone was saved, to be used as autograft.  The ligamentum flavum which was thickened at multiple levels was carefully removed decompressing the thecal sac.  The worst stenosis was probably at the C5-6 level.  I was able to remove the inferior portion of C3, the superior portion of C7 and the entire lamina at C4, C5, and C6.  Good decompression of spinal canal and thecal sac was achieved.  There was moderate bleeding from epidural veins, which was controlled with Gelfoam and thrombin.  The edges of the bone were waxed as needed to establish hemostasis and once  hemostasis was established, the decompression was completed.  We proceeded with completion of the arthrodesis.  We cut a rod in half and then  cuts were made to appropriate length .  It was  contoured with a rod bender and placed within 5 screw heads on each side, and then secured with locking caps. Once all 10 locking caps were placed, final tightening was performed with a torque wrench.  We then packed a combination of Vitoss as well as locally harvested morselized autograft in the lateral gutters and over the facet joints at each level bilaterally.  The wound was irrigated numerous times throughout the procedure with saline solution and bacitracin solution.  We did place a 10-mm wide flat Jackson-Pratt drain in the laminectomy defect.  It was brought through a separate stab incision, and then we proceeded with closure.  Paraspinal muscle flaps were approximated with interrupted undyed 0 Vicryl sutures.  The fascia was closed with interrupted undyed 0 Vicryl sutures.  Scarpa fascia was closed with interrupted undyed 0 Vicryl sutures.  The subcu and subcuticular closed with interrupted inverted 2-0 Vicryl sutures.  Skin was closed with surgical staples.  The drain was sutured in place with a 3-0 nylon suture.  The wound was dressed with Adaptic, sterile gauze and Hypafix.  The procedure was tolerated well.  Estimated blood loss was 550 mL.  Sponge and needle count correct.  Following surgery, the patient was turned back to supine position, the 3-pin Mayfield head holder was removed.  He is to be reversed from the anesthetic, extubated, transferred to the recovery room for further care.     [name], M.D.     RWN/MEDQ  D:  09/27/2011  T:  09/28/2011  Job:  [id number]  Electronically Signed by Shirlean Kelly M.D. on 10/03/2011 09:09:09 AM

## 2011-10-04 NOTE — Discharge Summary (Signed)
  NAMEJAVIS, Shane [name] NO.:  [id number]  MEDICAL RECORD NO.:  [id number]  LOCATION:  3015                         FACILITY:  [facility]  PHYSICIAN:  Coletta Memos, M.D.     DATE OF BIRTH:  March 26, 1941  DATE OF ADMISSION:  09/27/2011 DATE OF DISCHARGE:  10/01/2011                              DISCHARGE SUMMARY   ADMITTING DIAGNOSES:  Cervical stenosis, cervical spondylosis with myelopathy, quadriparesis.  DISCHARGE DIAGNOSES:  Cervical stenosis, cervical spondylosis with myelopathy, resolving quadriparesis, status post posterior cervical laminectomy and fusion C3-C7 with local autograft, vertex lateral mass screws and rods which is a [employer] system.  INDICATIONS:  Mr. Tawil is a 70 year old gentleman who was admitted on September 27, 2011 for increasing weakness in the right lower extremity. He had undergone an ACDF from C3-C7, 8 days prior to admission for stenosis of myelopathy.  MRI scan showed evidence of a hematoma and soft tissue constriction the spinal cord.  Decision was made to take him to the operating room for posterior cervical laminectomy with subsequent arthrodesis using a hardware.  He was admitted that day and taken to the operating room on an urgent basis.  Postoperatively, he has done quite well with increased strength in the right lower extremity.  He is ambulating well.  He is voiding without difficulty.  Posterior cervical incision is clean and dry.  No signs of infection.  He does have on the collar and was given instructions to wear that when he returned home.  He is not to drive. He can be a passenger.  He was given instructions of no heavy lifting, bending, or twisting.  He is not to do any kind of work at home.  He will be seen on October 05, 2011 by Dr. Newell Coral for staple removal.  He has pain medications which were prescribed from his original operation.          ______________________________ Coletta Memos, M.D.     KC/MEDQ  D:   10/01/2011  T:  10/01/2011  Job:  [id number]  Electronically Signed by Coletta Memos M.D. on 10/04/2011 02:53:32 PM

## 2011-11-08 ENCOUNTER — Telehealth: Payer: Self-pay | Admitting: Internal Medicine

## 2011-11-08 NOTE — Telephone Encounter (Signed)
They need to check with the neurosurgeon

## 2011-11-08 NOTE — Telephone Encounter (Signed)
Pt is scheduled for previsit 12/7 and colon on 12/21. Wife is calling wanting to make sure it is ok for her husband to have the procedure. He had anterior fusion neck surgery 10/15 and then posterior fusion neck surgery 10/23. Dr. Newell Coral cleared him to drive on 16/10 and switched him to a soft cervical collar that he wears all the time now. Dr. Marina Goodell is it ok to proceed with colon? Please advise.

## 2011-11-09 NOTE — Telephone Encounter (Signed)
Spoke with pts wife and she states that Dr. Newell Coral was fine with him having the procedure. They will keep appts as scheduled.

## 2011-11-11 ENCOUNTER — Ambulatory Visit (AMBULATORY_SURGERY_CENTER): Payer: Medicare Other

## 2011-11-11 VITALS — Ht 72.0 in | Wt 241.0 lb

## 2011-11-11 DIAGNOSIS — Z86010 Personal history of colonic polyps: Secondary | ICD-10-CM

## 2011-11-11 DIAGNOSIS — Z1211 Encounter for screening for malignant neoplasm of colon: Secondary | ICD-10-CM

## 2011-11-11 MED ORDER — PEG-KCL-NACL-NASULF-NA ASC-C 100 G PO SOLR: 1.0000 | Freq: Once | ORAL | Status: AC

## 2011-11-14 ENCOUNTER — Encounter: Payer: Self-pay | Admitting: Internal Medicine

## 2011-11-25 ENCOUNTER — Other Ambulatory Visit: Payer: Medicare Other | Admitting: Internal Medicine

## 2011-12-06 HISTORY — PX: CATARACT EXTRACTION W/ INTRAOCULAR LENS  IMPLANT, BILATERAL: SHX1307

## 2012-08-21 ENCOUNTER — Encounter: Payer: Self-pay | Admitting: Internal Medicine

## 2012-09-13 ENCOUNTER — Ambulatory Visit (AMBULATORY_SURGERY_CENTER): Payer: Medicare Other | Admitting: *Deleted

## 2012-09-13 ENCOUNTER — Telehealth: Payer: Self-pay | Admitting: Internal Medicine

## 2012-09-13 ENCOUNTER — Telehealth: Payer: Self-pay | Admitting: *Deleted

## 2012-09-13 VITALS — Ht 71.5 in | Wt 260.0 lb

## 2012-09-13 DIAGNOSIS — Z1211 Encounter for screening for malignant neoplasm of colon: Secondary | ICD-10-CM

## 2012-09-13 MED ORDER — MOVIPREP 100 G PO SOLR: ORAL | Status: DC

## 2012-09-13 NOTE — Telephone Encounter (Signed)
Shane [name] had extensive neck surgery done 11/2011.  He has been cleared by Dr. Jule Ser to have his colonoscopy.  My question is do you want him done in [facility] or at [facility]? He has not been intubated since his anterior and posterior surgeries.  He was not told he would be a difficult intubation.  I completed his Previsit and gave him his prep instructions and told him we would contact him if you decided to have his procedure done at [facility].  Also Shane [name] has an insulin pump and will need instructions from his PCP re: dosing changes during preparation for colon.  Wyona Almas

## 2012-09-13 NOTE — Progress Notes (Signed)
Mr. Dedman had extensive neck surgery done 11/2011.  He has been cleared by Dr. Nudleman to have his colonoscopy.  My question is do you want him done in [facility] or at [facility]? He has not been intubated since his anterior and posterior surgeries.  He was not told he would be a difficult intubation.  I completed his Previsit and gave him his prep instructions and told him we would contact him if you decided to have his procedure done at [facility].  Also Mr. Gilden has an insulin pump and will need instructions from his PCP re: dosing changes during preparation for colon.  JoAnn Edmonson   

## 2012-09-13 NOTE — Telephone Encounter (Signed)
Talked with pt: He is not sure why his wife called.  Pt scheduled for PV today at 10:00.  He says that he has been released by neurosurgeon from neck surgery

## 2012-09-14 NOTE — Telephone Encounter (Signed)
Shane [name], this patient is due for routine surveillance colonoscopy. However, there are some questions regarding his airway should the potential for intubation arise. Therefore, his surveillance colonoscopy should be done it was a long hospital. This will need to be done during my hospital week. However, it will need to be my first hospital week after the new year. We will need CRNA supervised propofol with anesthesia when scheduling. Please let the patient know that I have reviewed his records and these are my recommendations. Thank you. Convert to phone note.

## 2012-09-18 NOTE — Telephone Encounter (Signed)
Spoke with pt and he is aware. Will call pt back to schedule at [facility] after the 1st of the year.

## 2012-09-18 NOTE — Telephone Encounter (Signed)
Left message for pt to call back  °

## 2012-09-27 ENCOUNTER — Encounter: Payer: Medicare Other | Admitting: Internal Medicine

## 2012-11-20 ENCOUNTER — Other Ambulatory Visit: Payer: Self-pay | Admitting: Internal Medicine

## 2012-11-20 DIAGNOSIS — Z1211 Encounter for screening for malignant neoplasm of colon: Secondary | ICD-10-CM

## 2012-11-21 NOTE — Telephone Encounter (Signed)
Pt scheduled at [facility] 01/14/13 for colon with propofol. Pt to arrive at 8am for a 9am appt. Scheduled with Noreene Larsson, case [phone]. Pt scheduled for previsit 12/24/12@10am . Pt aware of appt dates and times.  Call placed to PCP Dr. Jacky Kindle for directions regarding Insulin pump instructions.

## 2012-11-21 NOTE — Telephone Encounter (Signed)
Per Dr. Jacky Kindle they will handle instructing the pt regarding his insulin pump. Pts wife works at [employer].

## 2012-12-13 ENCOUNTER — Telehealth: Payer: Self-pay | Admitting: Internal Medicine

## 2012-12-13 NOTE — Telephone Encounter (Signed)
Pts wife had questions regarding the pts appt for previsit and his colon appt. Discussed both appts with Mrs. Esh and all questions were answered.

## 2012-12-24 ENCOUNTER — Ambulatory Visit (AMBULATORY_SURGERY_CENTER): Payer: Medicare Other | Admitting: *Deleted

## 2012-12-24 VITALS — Ht 72.5 in | Wt 260.4 lb

## 2012-12-24 DIAGNOSIS — Z1211 Encounter for screening for malignant neoplasm of colon: Secondary | ICD-10-CM

## 2012-12-24 MED ORDER — PEG-KCL-NACL-NASULF-NA ASC-C 100 G PO SOLR: ORAL | Status: DC

## 2012-12-24 NOTE — Progress Notes (Signed)
No allergies to eggs or soy products  Moviprep RX printed and given to pt to have filled

## 2012-12-28 ENCOUNTER — Encounter (HOSPITAL_COMMUNITY): Payer: Self-pay | Admitting: *Deleted

## 2013-01-02 ENCOUNTER — Encounter (HOSPITAL_COMMUNITY): Payer: Self-pay | Admitting: *Deleted

## 2013-01-02 NOTE — Pre-Procedure Instructions (Addendum)
Your procedure is scheduled ZO:[id number], January 14, 2013 Report to [facility] Admitting [id number] Call this number if you have problems morning of your [phone]  Follow all bowel prep instructions per your doctor's orders.  Do not eat or drink anything after midnight the night before your procedure. You may brush your teeth, rinse out your mouth, but no water, no food, no chewing gum, no mints, no candies, no chewing tobacco.     Take these medicines the morning of your procedure with A SIP OF WATER:Bydtolic 1/2 tab. and Pantoprazole   Please make arrangements for a responsible person to drive you home after the procedure. You cannot go home by cab/taxi. We recommend you have someone with you at home the first 24 hours after your procedure. Driver for procedure is wife Purvis Sidle [phone]  LEAVE ALL VALUABLES, JEWELRY, BILLFOLD AT HOME.  NO DENTURES, CONTACT LENSES ALLOWED IN THE ENDOSCOPY ROOM.   YOU MAY WEAR DEODORANT, PLEASE REMOVE ALL JEWELRY, WATCHES RINGS, BODY PIERCINGS AND LEAVE AT HOME.   WOMEN: NO MAKE-UP, LOTIONS PERFUMES  See note 11-21-2013 re: insulin pump (Vgo insulin pump) states that Dr. Jacky Kindle will instruct patient and wife for the management of the insulin pump pre and post procedure wife aware works in Dr. Lanell Matar office .  Dr. Lamar Sprinkles office made the contact.  01-03-2013 1015 Spoke with Dr. Leta Jungling one of the anesthiologist explained the situation, he gave permission to use Dr. Lanell Matar plan for managing the Vgo insulin pump during the colonoscopy procedure per Dr. Lamar Sprinkles request from Dr. Jacky Kindle. 01-03-2013 1020 Requested written instructions for managing the Vgo insulin pump from Dr. Lanell Matar office for the chart for the anesthiologist to follow along with Dr. Marina Goodell the patient and wife pre procedure, during the procedure and post procedure. 01-07-2013 No written instructions received from Dr. Lanell Matar office for chart patient will manage his  insulin pump per Dr. Lanell Matar instructions.

## 2013-01-08 ENCOUNTER — Encounter (HOSPITAL_COMMUNITY): Payer: Self-pay | Admitting: Pharmacy Technician

## 2013-01-14 ENCOUNTER — Encounter (HOSPITAL_COMMUNITY): Payer: Self-pay | Admitting: Anesthesiology

## 2013-01-14 ENCOUNTER — Encounter (HOSPITAL_COMMUNITY): Payer: Self-pay | Admitting: *Deleted

## 2013-01-14 ENCOUNTER — Ambulatory Visit (HOSPITAL_COMMUNITY)
Admission: RE | Admit: 2013-01-14 | Discharge: 2013-01-14 | Disposition: A | Discharge status: Home / Self Care | Payer: Medicare Other | Source: Ambulatory Visit | Attending: Internal Medicine | Admitting: Internal Medicine

## 2013-01-14 ENCOUNTER — Ambulatory Visit (HOSPITAL_COMMUNITY): Payer: Medicare Other | Admitting: Anesthesiology

## 2013-01-14 ENCOUNTER — Encounter (HOSPITAL_COMMUNITY)
Admission: RE | Disposition: A | Discharge status: Home / Self Care | Payer: Self-pay | Source: Ambulatory Visit | Attending: Internal Medicine

## 2013-01-14 DIAGNOSIS — Z79899 Other long term (current) drug therapy: Secondary | ICD-10-CM | POA: Insufficient documentation

## 2013-01-14 DIAGNOSIS — Z860100 Personal history of colon polyps, unspecified: Secondary | ICD-10-CM | POA: Insufficient documentation

## 2013-01-14 DIAGNOSIS — Z1211 Encounter for screening for malignant neoplasm of colon: Secondary | ICD-10-CM

## 2013-01-14 DIAGNOSIS — N189 Chronic kidney disease, unspecified: Secondary | ICD-10-CM | POA: Insufficient documentation

## 2013-01-14 DIAGNOSIS — K219 Gastro-esophageal reflux disease without esophagitis: Secondary | ICD-10-CM | POA: Insufficient documentation

## 2013-01-14 DIAGNOSIS — E785 Hyperlipidemia, unspecified: Secondary | ICD-10-CM | POA: Insufficient documentation

## 2013-01-14 DIAGNOSIS — E1129 Type 2 diabetes mellitus with other diabetic kidney complication: Secondary | ICD-10-CM | POA: Insufficient documentation

## 2013-01-14 DIAGNOSIS — I129 Hypertensive chronic kidney disease with stage 1 through stage 4 chronic kidney disease, or unspecified chronic kidney disease: Secondary | ICD-10-CM | POA: Insufficient documentation

## 2013-01-14 DIAGNOSIS — D126 Benign neoplasm of colon, unspecified: Secondary | ICD-10-CM

## 2013-01-14 DIAGNOSIS — G4730 Sleep apnea, unspecified: Secondary | ICD-10-CM | POA: Insufficient documentation

## 2013-01-14 HISTORY — PX: COLONOSCOPY: SHX5424

## 2013-01-14 LAB — GLUCOSE, CAPILLARY
Glucose-Capillary: 203 mg/dL — ABNORMAL HIGH (ref 70–99)
Glucose-Capillary: 147 mg/dL — ABNORMAL HIGH (ref 70–99)

## 2013-01-14 SURGERY — COLONOSCOPY
Anesthesia: Monitor Anesthesia Care

## 2013-01-14 MED ORDER — PROPOFOL 10 MG/ML IV EMUL
INTRAVENOUS | Status: DC | PRN
  Administered 2013-01-14: 100 ug/kg/min via INTRAVENOUS

## 2013-01-14 MED ORDER — SODIUM CHLORIDE 0.9 % IV SOLN: INTRAVENOUS | Status: DC

## 2013-01-14 MED ORDER — LACTATED RINGERS IV SOLN
INTRAVENOUS | Status: DC | PRN
  Administered 2013-01-14: 09:00:00 via INTRAVENOUS

## 2013-01-14 MED ORDER — PROPOFOL 10 MG/ML IV BOLUS
INTRAVENOUS | Status: DC | PRN
  Administered 2013-01-14 (×2): 20 mg via INTRAVENOUS

## 2013-01-14 MED ORDER — FENTANYL CITRATE 0.05 MG/ML IJ SOLN
INTRAMUSCULAR | Status: DC | PRN
  Administered 2013-01-14 (×2): 25 ug via INTRAVENOUS

## 2013-01-14 NOTE — Anesthesia Postprocedure Evaluation (Signed)
Anesthesia Post Note  Patient: Shane [name]  Procedure(s) Performed: Procedure(s) (LRB): COLONOSCOPY (N/A)  Anesthesia type: MAC  Patient location: PACU  Post pain: Pain level controlled  Post assessment: Post-op Vital signs reviewed  Last Vitals: BP 139/75  Pulse 71  Temp(Src) 36.6 C (Oral)  Resp 15  Ht 5\' 11"  (1.803 m)  Wt 245 lb (111.131 kg)  BMI 34.19 kg/m2  SpO2 96%  Post vital signs: Reviewed  Level of consciousness: awake  Complications: No apparent anesthesia complications

## 2013-01-14 NOTE — Op Note (Signed)
[facility] [address] [place], [address]   COLONOSCOPY PROCEDURE REPORT  PATIENT: Shane [name], Shane [name]  MR#: [id number] BIRTHDATE: [birth year] , 71  yrs. old GENDER: Male ENDOSCOPIST: Roxy Cedar, MD REFERRED [id number] Program Recall PROCEDURE DATE:  01/14/2013 PROCEDURE:   Colonoscopy with snare polypectomy    x 1 ASA CLASS:   Class II INDICATIONS:Patient's personal history of adenomatous colon polyps.  MEDICATIONS: MAC sedation, administered by CRNA and See Anesthesia Report.  DESCRIPTION OF PROCEDURE:   After the risks benefits and alternatives of the procedure were thoroughly explained, informed consent was obtained.  A digital rectal exam revealed no abnormalities of the rectum.   The Pentax Colonoscope [id number] endoscope was introduced through the anus and advanced to the cecum, which was identified by both the appendix and ileocecal valve. No adverse events experienced.   The quality of the prep was good, using MoviPrep  The instrument was then slowly withdrawn as the colon was fully examined.      COLON FINDINGS: A diminutive polyp was found in the descending colon.  A polypectomy was performed with a cold snare.  The resection was complete and the polyp tissue was completely retrieved.   The colon mucosa was otherwise normal.  Retroflexed views revealed no abnormalities. The time to cecum=10 minutes 0 seconds.  Withdrawal time=12 minutes 0 seconds.  The scope was withdrawn and the procedure completed. COMPLICATIONS: There were no complications.  ENDOSCOPIC IMPRESSION: 1.   Diminutive polyp was found in the descending colon; polypectomy was performed with a cold snare 2.   The colon mucosa was otherwise normal  RECOMMENDATIONS: 1. Follow up colonoscopy in 5 years   eSigned:  Roxy Cedar, MD 01/14/2013 9:28 AM   cc: [name], MD and The Patient

## 2013-01-14 NOTE — H&P (Signed)
  HISTORY OF PRESENT ILLNESS:  Shane [name] is a 72 y.o. male with below medical hx. Presents for surveillance colonoscopy. Has history of adenomatous polyps. Prior exams 2002,2003,2007 (TA). No active GI complaints  REVIEW OF SYSTEMS:  All non-GI ROS negative except for recent neck surgery  Past Medical History  Diagnosis Date  . Hyperlipidemia   . GERD (gastroesophageal reflux disease)   . Chest pain   . Cataract   . Hypertension   . Sleep apnea      uses cpap asked to bring mask and tubing  . Chronic kidney disease     d/t DM  . Neuromuscular disorder   . Diabetes mellitus     Vgo disposal insulin bolus  simular to insulin pump    Past Surgical History  Procedure Laterality Date  . Anterior fusion cervical spine  2012  . Cervical laminectomy  2012  . Posterior fusion cervical spine  2012  . Lumbar laminectomy  2003  . Trigger finger release  2011    4th finger left hand  . Knee surgery  1998    left  . Cardiac catheterization  2011  . Cataract extraction w/ intraocular lens  implant, bilateral  2013  . Carpel tunnel      left wrist    Social History Shane [name]  reports that he has never smoked. He has never used smokeless tobacco. He reports that he does not drink alcohol or use illicit drugs.  family history is negative for Colon cancer, and Esophageal cancer, and Rectal cancer, and Stomach cancer, .  Allergies  Allergen Reactions  . Codeine Hives and Itching       PHYSICAL EXAMINATION: Vital signs: BP 132/72  Pulse 71  Temp(Src) 98.2 F (36.8 C) (Oral)  Resp 24  Ht 5\' 11"  (1.803 m)  Wt 245 lb (111.131 kg)  BMI 34.19 kg/m2  SpO2 97%  Constitutional: generally well-appearing, no acute distress Psychiatric: alert and oriented x3, cooperative Eyes: extraocular movements intact, anicteric, conjunctiva pink Mouth: oral pharynx moist, no lesions Neck: supple no lymphadenopathy Cardiovascular: heart regular rate and rhythm, no murmur Lungs: clear  to auscultation bilaterally Abdomen: soft, nontender, nondistended, no obvious ascites, no peritoneal signs, normal bowel sounds, no organomegaly Rectal:deferred until colonoscopy Extremities: no lower extremity edema bilaterally Skin: no lesions on visible extremities Neuro: No focal deficits.   ASSESSMENT:  1. History adenomatous colon polyps, due for surveillance colonoscopy 2. General medical problems, stable   PLAN:  1. Colonoscopy.The nature of the procedure, as well as the risks, benefits, and alternatives were carefully and thoroughly reviewed with the patient. Ample time for discussion and questions allowed. The patient understood, was satisfied, and agreed to proceed.

## 2013-01-14 NOTE — Anesthesia Preprocedure Evaluation (Addendum)
Anesthesia Evaluation  Patient identified by MRN, date of birth, ID band Patient awake    Reviewed: Allergy & Precautions, H&P , NPO status , Patient's Chart, lab work & pertinent test results  Airway Mallampati: III TM Distance: >3 FB Neck ROM: Full    Dental  (+) Dental Advisory Given   Pulmonary sleep apnea and Continuous Positive Airway Pressure Ventilation ,  breath sounds clear to auscultation  Pulmonary exam normal       Cardiovascular hypertension, Pt. on medications Rhythm:Regular Rate:Normal     Neuro/Psych  Neuromuscular disease negative psych ROS   GI/Hepatic Neg liver ROS, GERD-  Medicated,  Endo/Other  diabetes, Type 2, Oral Hypoglycemic Agents and Insulin Dependent  Renal/GU Renal disease     Musculoskeletal negative musculoskeletal ROS (+)   Abdominal (+) + obese,   Peds  Hematology negative hematology ROS (+)   Anesthesia Other Findings   Reproductive/Obstetrics                        Anesthesia Physical Anesthesia Plan  ASA: III  Anesthesia Plan: MAC   Post-op Pain Management:    Induction: Intravenous  Airway Management Planned: Simple Face Mask  Additional Equipment:   Intra-op Plan:   Post-operative Plan:   Informed Consent: I have reviewed the patients History and Physical, chart, labs and discussed the procedure including the risks, benefits and alternatives for the proposed anesthesia with the patient or authorized representative who has indicated his/her understanding and acceptance.   Dental advisory given  Plan Discussed with: CRNA  Anesthesia Plan Comments:         Anesthesia Quick Evaluation

## 2013-01-14 NOTE — Transfer of Care (Signed)
Immediate Anesthesia Transfer of Care Note  Patient: Shane [name]  Procedure(s) Performed: Procedure(s) (LRB): COLONOSCOPY (N/A)  Patient Location: PACU  Anesthesia Type: MAC  Level of Consciousness: sedated, patient cooperative and responds to stimulaton  Airway & Oxygen Therapy: Patient Spontanous Breathing and Patient connected to face mask oxgen  Post-op Assessment: Report given to PACU RN and Post -op Vital signs reviewed and stable  Post vital signs: Reviewed and stable  Complications: No apparent anesthesia complications

## 2013-01-15 ENCOUNTER — Encounter: Payer: Self-pay | Admitting: Internal Medicine

## 2013-01-15 ENCOUNTER — Encounter (HOSPITAL_COMMUNITY): Payer: Self-pay | Admitting: Internal Medicine

## 2013-09-18 ENCOUNTER — Other Ambulatory Visit: Payer: Self-pay | Admitting: Neurosurgery

## 2013-09-18 DIAGNOSIS — G959 Disease of spinal cord, unspecified: Secondary | ICD-10-CM

## 2013-09-28 ENCOUNTER — Ambulatory Visit
Admission: RE | Admit: 2013-09-28 | Discharge: 2013-09-28 | Disposition: A | Discharge status: Home / Self Care | Payer: Medicare Other | Source: Ambulatory Visit | Attending: Neurosurgery | Admitting: Neurosurgery

## 2013-09-28 DIAGNOSIS — G959 Disease of spinal cord, unspecified: Secondary | ICD-10-CM

## 2015-05-14 ENCOUNTER — Other Ambulatory Visit: Payer: Self-pay | Admitting: Internal Medicine

## 2015-05-14 DIAGNOSIS — M545 Low back pain: Secondary | ICD-10-CM

## 2016-09-28 ENCOUNTER — Other Ambulatory Visit (HOSPITAL_COMMUNITY): Payer: Self-pay | Admitting: Internal Medicine

## 2016-09-28 DIAGNOSIS — R0602 Shortness of breath: Secondary | ICD-10-CM

## 2016-10-05 ENCOUNTER — Ambulatory Visit (HOSPITAL_COMMUNITY)
Admission: RE | Admit: 2016-10-05 | Discharge: 2016-10-05 | Disposition: A | Discharge status: Home / Self Care | Payer: Medicare Other | Source: Ambulatory Visit | Attending: Internal Medicine | Admitting: Internal Medicine

## 2016-10-05 DIAGNOSIS — E119 Type 2 diabetes mellitus without complications: Secondary | ICD-10-CM | POA: Diagnosis not present

## 2016-10-05 DIAGNOSIS — R0602 Shortness of breath: Secondary | ICD-10-CM | POA: Diagnosis not present

## 2016-10-05 DIAGNOSIS — I10 Essential (primary) hypertension: Secondary | ICD-10-CM | POA: Insufficient documentation

## 2016-10-05 DIAGNOSIS — E785 Hyperlipidemia, unspecified: Secondary | ICD-10-CM | POA: Insufficient documentation

## 2016-10-05 LAB — ECHOCARDIOGRAM COMPLETE
E decel time: 239 msec
E/e' ratio: 10.71
FS: 31 % (ref 28–44)
IVS/LV PW RATIO, ED: 1.21
LA ID, A-P, ES: 42 mm
LA diam end sys: 42 mm
LA diam index: 1.83 cm/m2
LA vol A4C: 67.3 ml
LA vol index: 29.3 mL/m2
LA vol: 67.5 mL
LV E/e' medial: 10.71
LV E/e'average: 10.71
LV PW d: 13.8 mm — AB (ref 0.6–1.1)
LV e' LATERAL: 4.35 cm/s
LVOT SV: 84 mL
LVOT VTI: 18.5 cm
LVOT area: 4.52 cm2
LVOT diameter: 24 mm
LVOT peak vel: 76.1 cm/s
Lateral S' vel: 10.3 cm/s
MV Dec: 239
MV pk A vel: 84.9 m/s
MV pk E vel: 46.6 m/s
TAPSE: 21.4 mm
TDI e' lateral: 4.35
TDI e' medial: 5.11

## 2016-10-05 MED ORDER — PERFLUTREN LIPID MICROSPHERE
1.0000 mL | INTRAVENOUS | Status: AC | PRN
  Administered 2016-10-05: 2 mL via INTRAVENOUS
  Filled 2016-10-05: qty 10

## 2016-10-05 NOTE — Progress Notes (Signed)
  Echocardiogram 2D Echocardiogram has been performed.  Jennette Dubin 10/05/2016, 11:59 AM

## 2016-10-10 ENCOUNTER — Encounter: Payer: Self-pay | Admitting: Physician Assistant

## 2016-10-10 ENCOUNTER — Ambulatory Visit (INDEPENDENT_AMBULATORY_CARE_PROVIDER_SITE_OTHER): Payer: Medicare Other | Admitting: Physician Assistant

## 2016-10-10 VITALS — BP 102/50 | HR 79 | Ht 72.0 in | Wt 268.0 lb

## 2016-10-10 DIAGNOSIS — R0689 Other abnormalities of breathing: Secondary | ICD-10-CM | POA: Insufficient documentation

## 2016-10-10 DIAGNOSIS — I10 Essential (primary) hypertension: Secondary | ICD-10-CM

## 2016-10-10 DIAGNOSIS — R0600 Dyspnea, unspecified: Secondary | ICD-10-CM | POA: Insufficient documentation

## 2016-10-10 DIAGNOSIS — R0609 Other forms of dyspnea: Secondary | ICD-10-CM | POA: Diagnosis not present

## 2016-10-10 NOTE — Patient Instructions (Addendum)
Medication Instructions:  NO CHANGES   If you need a refill on your cardiac medications before your next appointment, please call your pharmacy.  Labwork: NONE  Testing/Procedures: Your physician has requested that you have a lexiscan myoview-2DAY. For further information please visit [email or web address]. Please follow instruction sheet, as given.    Follow-Up: Your physician recommends that you schedule a follow-up appointment in: OK TO WORK-INTO NEW PT APPT PER DEBRA   Any Other Special Instructions Will Be Listed Below (If Applicable). MAKE SURE YOU DECREASE INSULIN THE DAY OF THE LEXISCAN-MYOVIEW     Thank you for choosing [facility]!!

## 2016-10-10 NOTE — Progress Notes (Signed)
Cardiology Office Note   Date:  10/10/2016   ID:  Shane [name], DOB [birth year], MRN [id number]  PCP:  Geoffery Lyons, MD  Cardiologist:  Was Dr Little>>Dr Alcide Evener, PA-C   Chief Complaint  Patient presents with  . Shortness of Breath    pt having SOB for a couple of months    History of Present Illness: Shane [name] is a 75 y.o. 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  Shane [name] presents for evaluation of DOE.  [name] has noticed increasing DOE for several months, but it got worse in the last few weeks. He is compliant with CPAP and is not having any trouble with that. However, his DOE has worsened to the point that he cannot walk 30 feet without getting SOB. 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 He has fallen at times due to his L leg not working well, but generally manages this ok.   He does not get LE edema. He denies orthopnea or PND. He has gained weight, but it has been gradual, over time and not all of a sudden. He struggles not to put on weight because of his diabetes.   He never gets chest pain. He can go up a flight of stairs if he is careful, but will be SOB at the top. His surgeon or PT told him that his balance problems made him work extra hard to walk and walking would always be more effort than it used to be. However, he feels he has gone downhill over time.   He is willing to exercise, but because of his LLE problems, cannot use most pieces of exercise equipment (he has several).   Past Medical History:  Diagnosis Date  . Cataract   . Chest pain   . Chronic kidney disease    d/t DM  . Diabetes mellitus    Vgo disposal insulin bolus  simular to insulin pump  . GERD (gastroesophageal reflux disease)   . Hyperlipidemia   .  Hypertension   . Neuromuscular disorder ([place])   . Sleep apnea     uses cpap asked to bring mask and tubing    Past Surgical History:  Procedure Laterality Date  . ANTERIOR FUSION CERVICAL SPINE  2012  . CARDIAC CATHETERIZATION  2011  . carpel tunnel     left wrist  . CATARACT EXTRACTION W/ INTRAOCULAR LENS  IMPLANT, BILATERAL  2013  . CERVICAL LAMINECTOMY  2012  . COLONOSCOPY N/A 01/14/2013   Procedure: COLONOSCOPY;  Surgeon: Irene Shipper, MD;  Location: [facility];  Service: Endoscopy;  Laterality: N/A;  . [place]   left  . LUMBAR LAMINECTOMY  2003  . POSTERIOR FUSION CERVICAL SPINE  2012  . TRIGGER FINGER RELEASE  2011   4th finger left hand    Medication Sig  . aspirin 81 MG tablet Take 81 mg by mouth daily.    . diazepam (VALIUM) 5 MG tablet Take 5 mg by mouth 3 (three) times daily as needed. For sleep  . furosemide (LASIX) 40 MG tablet Take 1 tablet by mouth daily.  . insulin lispro (HUMALOG KWIKPEN) 100 UNIT/ML injection Inject 20 Units into the skin 3 (three) times daily before meals. Sliding  scale  . insulin lispro (HUMALOG) 100 UNIT/ML injection Inject 100 Units into the skin continuous. Use over continuous rate in v-go bag over 24 hours  . Insulin Pump Disposable (V-GO 40) KIT 100 Units by Does not apply route daily. humalog 100 units dispensed every 24 hours. Dispose v-go bag after 24 hours of use.  Marland Kitchen KLOR-CON M20 20 MEQ tablet Take 1 tablet by mouth daily.  Marland Kitchen lisinopril (PRINIVIL,ZESTRIL) 20 MG tablet Take 1 tablet by mouth daily.  . Multiple Vitamin (MULTIVITAMIN) tablet Take 1 tablet by mouth daily.    . nebivolol (BYSTOLIC) 10 MG tablet Take 5 mg by mouth daily.   . nitroGLYCERIN (NITROSTAT) 0.4 MG SL tablet Place 0.4 mg under the tongue every 5 (five) minutes as needed. For chest pain  . pantoprazole (PROTONIX) 40 MG tablet Take 40 mg by mouth daily.    Marland Kitchen PROAIR HFA 108 (90 Base) MCG/ACT inhaler Inhale 2 puffs into the lungs 4 (four) times daily.  .  rosuvastatin (CRESTOR) 5 MG tablet Take 5 mg by mouth every morning.   . traMADol (ULTRAM) 50 MG tablet Take 50 mg by mouth every 6 (six) hours as needed. Maximum dose= 8 tablets per day   . triamterene-hydrochlorothiazide (DYAZIDE) 37.5-25 MG per capsule Take 1 capsule by mouth every morning.     No current facility-administered medications for this visit.     Allergies:   Codeine    Social History:  The patient  reports that he has never smoked. He has never used smokeless tobacco. He reports that he does not drink alcohol or use drugs.   Family History:  The patient's family history includes Emphysema (age of onset: 20) in his father.    ROS:  Please see the history of present illness. All other systems are reviewed and negative.    PHYSICAL EXAM: VS:  BP (!) 102/50   Pulse 79   Ht 6' (1.829 m)   Wt 268 lb (121.6 kg)   BMI 36.35 kg/m  , BMI Body mass index is 36.35 kg/m. GEN: Well nourished, well developed, male in no acute distress  HEENT: normal for age  Neck: no JVD seen but difficult to assess 2nd body habitus, no carotid bruit, no masses Cardiac: RRR; soft murmur, no rubs, or gallops Respiratory: decreased BS bases but clear to auscultation bilaterally, normal work of breathing GI: soft, nontender, nondistended, + BS MS: no deformity or atrophy; no edema; distal pulses are 2+ in all 4 extremities   Skin: warm and dry, no rash Neuro:  Strength and sensation are intact Psych: euthymic mood, full affect   EKG:  EKG is ordered today. The ekg ordered today demonstrates SR w/ RBBB, seen on telemetry strips from 2012.  ECHO: 10/05/2016 - Left ventricle: The cavity size was normal. Wall thickness was   increased in a pattern of moderate LVH. Systolic function was   normal. The estimated ejection fraction was in the range of 55%   to 65%. Wall motion was normal; there were no regional wall   motion abnormalities. Doppler parameters are consistent with   abnormal left  ventricular relaxation (grade 1 diastoli  dysfunction). - Aortic valve: Mildly to moderately calcified annulus. Moderately   thickened, moderately calcified leaflets.  Recent Labs: No results found for requested labs within last 8760 hours.    Lipid Panel    Component Value Date/Time   CHOL  02/04/2011 0530    127  TRIG 201 (H) 02/04/2011 0530   HDL 29 (L) 02/04/2011 0530   CHOLHDL 4.4 02/04/2011 0530   VLDL 40 02/04/2011 0530   LDLCALC  02/04/2011 0530    58            Wt Readings from Last 3 Encounters:  10/10/16 268 lb (121.6 kg)  01/14/13 245 lb (111.1 kg)  12/24/12 260 lb 6.4 oz (118.1 kg)     Other studies Reviewed: Additional studies/ records that were reviewed today include: Notes in Epic, Office records from [facility] [facility] and testing.  ASSESSMENT AND PLAN:Patient discussed with [name]  1.  DOE: I explained that his echocardiogram showed a history of high blood pressure with the muscle a bit thickened and stiff, but not bad. More importantly, his heart muscle is not weak. PAS and CVP are not listed, but he has no evidence of volume overload on physical exam (although I am not able to assess JVD). He has not been immobilized recently and works every day so I feel that his risk of PE is very low.  Because of his long history of diabetes, I am most worried about his dyspnea on exertion being an anginal equivalent. [name] as in agreement with proceeding with a Lexi scan Myoview as the next step. It will probably have to be a 2 day study, and I explained this to the patient. He is already on ASA, BB and statin, no dose changes needed.  2. CRFs: He is followed closely for his diabetes, lipids and BP.   Current medicines are reviewed at length with the patient today.  The patient does not have concerns regarding medicines.  The following changes have been made:  no change  Labs/ tests ordered today include:   Orders Placed This Encounter    Procedures  . Myocardial Perfusion Imaging  . EKG 12-Lead     Disposition:   FU with Dr Stanford Breed  Signed, Rosaria Ferries, PA-C  10/10/2016 4:47 PM    [place] Phone: [phone]; Fax: [phone]  This note was written with the assistance of speech recognition software. Please excuse any transcriptional errors.

## 2016-10-12 ENCOUNTER — Ambulatory Visit (INDEPENDENT_AMBULATORY_CARE_PROVIDER_SITE_OTHER): Payer: Medicare Other | Admitting: Internal Medicine

## 2016-10-12 ENCOUNTER — Encounter: Payer: Self-pay | Admitting: Internal Medicine

## 2016-10-12 VITALS — BP 108/60 | HR 67 | Ht 72.0 in | Wt 269.0 lb

## 2016-10-12 DIAGNOSIS — R053 Chronic cough: Secondary | ICD-10-CM | POA: Insufficient documentation

## 2016-10-12 DIAGNOSIS — R9389 Abnormal findings on diagnostic imaging of other specified body structures: Secondary | ICD-10-CM

## 2016-10-12 DIAGNOSIS — R0600 Dyspnea, unspecified: Secondary | ICD-10-CM | POA: Diagnosis not present

## 2016-10-12 DIAGNOSIS — R05 Cough: Secondary | ICD-10-CM

## 2016-10-12 DIAGNOSIS — R0989 Other specified symptoms and signs involving the circulatory and respiratory systems: Secondary | ICD-10-CM | POA: Insufficient documentation

## 2016-10-12 DIAGNOSIS — R938 Abnormal findings on diagnostic imaging of other specified body structures: Secondary | ICD-10-CM | POA: Diagnosis not present

## 2016-10-12 DIAGNOSIS — R0689 Other abnormalities of breathing: Secondary | ICD-10-CM

## 2016-10-12 NOTE — Progress Notes (Signed)
Subjective:    Patient ID: Shane [name], male    DOB: October 19, 1941, 75 y.o.   MRN: [id number] PCP ARONSON,RICHARD A, MD  HPI   IOV 10/12/2016  Chief Complaint  Patient presents with  . Pulomary Consult    Pt referred by Dr. [name] for DOE x months. pt states the SOB has worsened in the last couple weeks. Pt c/o dry cough. pt deneis f/c/s.     75 year old male referred for dyspnea. Reports that he is obese on CPAP on RA for years. 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 He has new onset dyspnea and cough. Details in ACCP ILD questionnaire below. CXR recently personally visualized shows ILD changes. Reports that pneumonia and lasix Rx by PCP have not helped. Review of old CT chest 12/05/2008 shows bibasal ILD without honeuycombing on non HRCT chest. Possibly datin back to 2007 CT abd lung cut. Current findings are worse.   Symptos:  Dyspnea is new x 4-5 months gets dyspneic hurrying on level ground or stops at 100 yerds. Cough x 3 weeks with cough at night and is dry and can be episodic with severe attacks.   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 Did get xposed to insecticide, fertiiler, talc pain, cement, tile    has a past medical history of Cataract; Chest pain; Chronic kidney disease; Diabetes mellitus; GERD (gastroesophageal reflux disease); Hyperlipidemia; Hypertension; Neuromuscular disorder ([place]); and Sleep apnea.   reports that he has never smoked. He has never used smokeless tobacco.  Past Surgical History:  Procedure Laterality Date  . ANTERIOR FUSION CERVICAL SPINE  2012  . CARDIAC  CATHETERIZATION  2011  . carpel tunnel     left wrist  . CATARACT EXTRACTION W/ INTRAOCULAR LENS  IMPLANT, BILATERAL  2013  . CERVICAL LAMINECTOMY  2012  . COLONOSCOPY N/A 01/14/2013   Procedure: COLONOSCOPY;  Surgeon: Irene Shipper, MD;  Location: [facility];  Service: Endoscopy;  Laterality: N/A;  . [place]   left  . LUMBAR LAMINECTOMY  2003  . POSTERIOR FUSION CERVICAL SPINE  2012  . TRIGGER FINGER RELEASE  2011   4th finger left hand    Allergies  Allergen Reactions  . Codeine Hives and Itching    Immunization History  Administered Date(s) Administered  . Influenza, High Dose Seasonal PF 08/05/2016  . Pneumococcal-Unspecified 12/05/2014    Family History  Problem Relation Age of Onset  . Diabetes Mellitus II Mother   . Emphysema Father 52  . Colon cancer Neg Hx   . Esophageal cancer Neg Hx   . Rectal cancer Neg Hx   . Stomach cancer Neg Hx      Current Outpatient Prescriptions:  .  aspirin 81 MG tablet, Take 81 mg by mouth daily.  , Disp: , Rfl:  .  diazepam (VALIUM) 5 MG tablet, Take 5 mg by mouth 3 (three) times daily as needed. For sleep, Disp: , Rfl:  .  furosemide (LASIX) 40 MG tablet, Take 1 tablet by mouth daily., Disp: , Rfl:  .  insulin lispro (HUMALOG KWIKPEN) 100 UNIT/ML injection, Inject 20 Units into the skin 3 (three) times daily before meals. Sliding scale, Disp: , Rfl:  .  insulin lispro (HUMALOG) 100 UNIT/ML injection, Inject 100 Units into the skin continuous. Use over continuous rate in v-go bag over 24 hours, Disp: , Rfl:  .  Insulin Pump Disposable (V-GO 40) KIT, 100 Units by Does not apply route daily. humalog 100 units dispensed every 24 hours. Dispose v-go bag after 24 hours of use., Disp: , Rfl:  .  KLOR-CON M20 20 MEQ tablet, Take 1 tablet by mouth daily., Disp: , Rfl:  .  lisinopril (PRINIVIL,ZESTRIL) 20 MG tablet, Take 1 tablet by mouth daily., Disp: , Rfl:  .  Multiple Vitamin (MULTIVITAMIN) tablet, Take 1 tablet by mouth  daily.  , Disp: , Rfl:  .  nebivolol (BYSTOLIC) 10 MG tablet, Take 5 mg by mouth daily. , Disp: , Rfl:  .  nitroGLYCERIN (NITROSTAT) 0.4 MG SL tablet, Place 0.4 mg under the tongue every 5 (five) minutes as needed. For chest pain, Disp: , Rfl:  .  pantoprazole (PROTONIX) 40 MG tablet, Take 40 mg by mouth daily.  , Disp: , Rfl:  .  PROAIR HFA 108 (90 Base) MCG/ACT inhaler, Inhale 2 puffs into the lungs every 4 (four) hours as needed. , Disp: , Rfl:  .  rosuvastatin (CRESTOR) 5 MG tablet, Take 5 mg by mouth every morning. , Disp: , Rfl:  .  traMADol (ULTRAM) 50 MG tablet, Take 50 mg by mouth every 6 (six) hours as needed. Maximum dose= 8 tablets per day , Disp: , Rfl:    Review of Systems  Constitutional: Negative for fever and unexpected weight change.  HENT: Negative for congestion, dental problem, ear pain, nosebleeds, postnasal drip, rhinorrhea, sinus pressure, sneezing, sore throat and trouble swallowing.   Eyes: Negative for redness and itching.  Respiratory: Positive for cough and shortness of breath. Negative for chest tightness and wheezing.   Cardiovascular: Negative for palpitations and leg swelling.  Gastrointestinal: Negative for nausea and vomiting.  Genitourinary: Negative for dysuria.  Musculoskeletal: Negative for joint swelling.  Skin: Negative for rash.  Neurological: Negative for headaches.  Hematological: Does not bruise/bleed easily.  Psychiatric/Behavioral: Negative for dysphoric mood. The patient is not nervous/anxious.        Objective:   Physical Exam  Constitutional: He is oriented to person, place, and time. He appears well-developed and well-nourished. No distress.  obese  HENT:  Head: Normocephalic and atraumatic.  Right Ear: External ear normal.  Left Ear: External ear normal.  Mouth/Throat: Oropharynx is clear and moist. No oropharyngeal exudate.  Small anterior neck scar   Eyes: Conjunctivae and EOM are normal. Pupils are equal, round, and reactive  to light. Right eye exhibits no discharge. Left eye exhibits no discharge. No scleral icterus.  Neck: Normal range of motion. Neck supple. No JVD present. No tracheal deviation present. No thyromegaly present.  Cardiovascular: Normal rate, regular rhythm and intact distal pulses.  Exam reveals no gallop and no friction rub.   No murmur heard. Pulmonary/Chest: Effort normal. No respiratory distress. He has no wheezes. He has rales. He exhibits no tenderness.  bibasal crackles 1/4th  Abdominal: Soft. Bowel sounds are normal. He exhibits no distension and no mass. There is no tenderness. There is no rebound and no guarding.  Musculoskeletal: Normal range of motion. He exhibits no edema or tenderness.  Lymphadenopathy:    He has no cervical adenopathy.  Neurological: He  is alert and oriented to person, place, and time. He has normal reflexes. No cranial nerve deficit. Coordination normal.  Skin: Skin is warm and dry. No rash noted. He is not diaphoretic. No erythema. No pallor.  Psychiatric: He has a normal mood and affect. His behavior is normal. Judgment and thought content normal.  Nursing note and vitals reviewed.   Vitals:   10/12/16 1029  BP: 108/60  Pulse: 67  SpO2: 98%  Weight: 269 lb (122 kg)  Height: 6' (1.829 m)   Estimated body mass index is 36.48 kg/m as calculated from the following:   Height as of this encounter: 6' (1.829 m).   Weight as of this encounter: 269 lb (122 kg).        Assessment & Plan:   Dyspnea and respiratory abnormality -  Abnormal chest x-ray -  Chronic cough Bibasilar crackles -     High suspicion for Pulmonary FIbrosis or ILD esp based on cxr and CT findings of 2012    - I am concerned you might have Interstitial Lung Disease (ILD)  -  There are > 100 varieties of this - To narrow down possibilities and assess severity please do the following tests   - do ACCP ILD questionnaire 10/12/2016 and give it to me before you leave  - do full PFT  breathing test (choose a location depending on schedule and convenience)  -  do overnight oxygen test on room air while on room air cpap  - do High Resolution CT chest wo contrast (only Dr Rosario Jacks or Dr Weber Cooks or Dr Polly Cobia to read)  - do autoimmune panel 10/12/2016 - : Serum: ESR, ANA, DS-DNA, RF, anti-CCP, ssA, ssB, scl-70, ANCA, MPO and PR-3 antibodies, Total CK,  Aldolase,  Hypersensitivity Pneumonitis Panel   Followup - next few weeks but after completing all of above - walk test on room air at fu    Dr. Brand Males, M.D., [facility].C.P Pulmonary and Critical Care Medicine Staff Physician [place] [facility] Pager: [phone], If no answer or between  15:00h - 7:00h: call [phone]  10/12/2016 10:21 PM

## 2016-10-12 NOTE — Patient Instructions (Addendum)
ICD-9-CM ICD-10-CM   1. Dyspnea and respiratory abnormality 786.09 R06.00     R06.89   2. Abnormal chest x-ray 793.2 R93.8   3. Chronic cough 786.2 R05   4. Bibasilar crackles 786.7 R09.89    High suspicion for Pulmonary FIbrosis or ILD esp based on cxr and CT findings of 2012    - I am concerned you might have Interstitial Lung Disease (ILD)  -  There are > 100 varieties of this - To narrow down possibilities and assess severity please do the following tests   - do ACCP ILD questionnaire 10/12/2016 and give it to me before you leave  - do full PFT breathing test (choose a location depending on schedule and convenience)  -  do overnight oxygen test on room air while on room air cpap  - do High Resolution CT chest wo contrast (only Dr Blietz or Dr Entrikin or Dr Poff to read)  - do autoimmune panel 10/12/2016 - : Serum: ESR, ANA, DS-DNA, RF, anti-CCP, ssA, ssB, scl-70, ANCA, MPO and PR-3 antibodies, Total CK,  Aldolase,  Hypersensitivity Pneumonitis Panel   Followup - next few weeks but after completing all of above  - walk test on RA at fu 

## 2016-10-13 ENCOUNTER — Telehealth: Payer: Self-pay | Admitting: Internal Medicine

## 2016-10-13 NOTE — Telephone Encounter (Signed)
Called and spoke with pts wife and she is aware that the pt can come in the morning for his labs that he forgot to do yesterday.  He will be coming in the morning for his pft.

## 2016-10-14 ENCOUNTER — Other Ambulatory Visit (INDEPENDENT_AMBULATORY_CARE_PROVIDER_SITE_OTHER): Payer: Medicare Other

## 2016-10-14 ENCOUNTER — Ambulatory Visit (INDEPENDENT_AMBULATORY_CARE_PROVIDER_SITE_OTHER): Payer: Medicare Other | Admitting: Internal Medicine

## 2016-10-14 DIAGNOSIS — R9389 Abnormal findings on diagnostic imaging of other specified body structures: Secondary | ICD-10-CM

## 2016-10-14 DIAGNOSIS — R938 Abnormal findings on diagnostic imaging of other specified body structures: Secondary | ICD-10-CM | POA: Diagnosis not present

## 2016-10-14 DIAGNOSIS — R0989 Other specified symptoms and signs involving the circulatory and respiratory systems: Secondary | ICD-10-CM

## 2016-10-14 DIAGNOSIS — R0689 Other abnormalities of breathing: Secondary | ICD-10-CM

## 2016-10-14 DIAGNOSIS — R0600 Dyspnea, unspecified: Secondary | ICD-10-CM | POA: Diagnosis not present

## 2016-10-14 LAB — PULMONARY FUNCTION TEST
DL/VA % pred: 81 %
DL/VA: 3.87 ml/min/mmHg/L
DLCO unc % pred: 46 %
DLCO unc: 16.82 ml/min/mmHg
FEF 25-75 Post: 4.3 L/sec
FEF 25-75 Pre: 4.02 L/sec
FEF2575-%Change-Post: 7 %
FEF2575-%Pred-Post: 172 %
FEF2575-%Pred-Pre: 161 %
FEV1-%Change-Post: 1 %
FEV1-%Pred-Post: 79 %
FEV1-%Pred-Pre: 78 %
FEV1-Post: 2.75 L
FEV1-Pre: 2.7 L
FEV1FVC-%Change-Post: 1 %
FEV1FVC-%Pred-Pre: 125 %
FEV6-%Change-Post: 0 %
FEV6-%Pred-Post: 67 %
FEV6-%Pred-Pre: 67 %
FEV6-Post: 3 L
FEV6-Pre: 3 L
FEV6FVC-%Pred-Post: 106 %
FEV6FVC-%Pred-Pre: 106 %
FVC-%Change-Post: 0 %
FVC-%Pred-Post: 63 %
FVC-%Pred-Pre: 63 %
FVC-Post: 3 L
FVC-Pre: 3 L
Post FEV1/FVC ratio: 92 %
Post FEV6/FVC ratio: 100 %
Pre FEV1/FVC ratio: 90 %
Pre FEV6/FVC Ratio: 100 %
RV % pred: 60 %
RV: 1.65 L
TLC % pred: 60 %
TLC: 4.62 L

## 2016-10-14 LAB — CARDIAC PANEL
CK-MB: 4.4 ng/mL — ABNORMAL HIGH (ref 0.3–4.0)
Relative Index: 1.9 calc (ref 0.0–2.5)
Total CK: 235 U/L — ABNORMAL HIGH (ref 7–232)

## 2016-10-14 LAB — SEDIMENTATION RATE: Sed Rate: 22 mm/hr — ABNORMAL HIGH (ref 0–20)

## 2016-10-14 NOTE — Progress Notes (Signed)
PFT done today. 10/14/16

## 2016-10-17 LAB — MPO/PR-3 (ANCA) ANTIBODIES
Myeloperoxidase Abs: <1
Serine Protease 3: <1

## 2016-10-17 LAB — ALDOLASE: Aldolase: 9.5 U/L — ABNORMAL HIGH (ref <=8.1)

## 2016-10-17 LAB — ANCA SCREEN W REFLEX TITER: ANCA Screen: NEGATIVE

## 2016-10-17 LAB — SJOGRENS SYNDROME-A EXTRACTABLE NUCLEAR ANTIBODY: SSA (Ro) (ENA) Antibody, IgG: <1

## 2016-10-17 LAB — ANTI-SCLERODERMA ANTIBODY: Scleroderma (Scl-70) (ENA) Antibody, IgG: <1

## 2016-10-17 LAB — ANA: Anti Nuclear Antibody(ANA): NEGATIVE

## 2016-10-17 LAB — CYCLIC CITRUL PEPTIDE ANTIBODY, IGG: Cyclic Citrullin Peptide Ab: <16 Units

## 2016-10-17 LAB — RHEUMATOID FACTOR: Rhuematoid fact SerPl-aCnc: <14 IU/mL (ref <14)

## 2016-10-17 LAB — ANTI-DNA ANTIBODY, DOUBLE-STRANDED: ds DNA Ab: <1 IU/mL

## 2016-10-17 LAB — SJOGRENS SYNDROME-B EXTRACTABLE NUCLEAR ANTIBODY: SSB (La) (ENA) Antibody, IgG: <1

## 2016-10-18 ENCOUNTER — Telehealth: Payer: Self-pay | Admitting: Internal Medicine

## 2016-10-18 ENCOUNTER — Ambulatory Visit (INDEPENDENT_AMBULATORY_CARE_PROVIDER_SITE_OTHER)
Admission: RE | Admit: 2016-10-18 | Discharge: 2016-10-18 | Disposition: A | Discharge status: Home / Self Care | Payer: Medicare Other | Source: Ambulatory Visit | Attending: Internal Medicine | Admitting: Internal Medicine

## 2016-10-18 DIAGNOSIS — R0989 Other specified symptoms and signs involving the circulatory and respiratory systems: Secondary | ICD-10-CM | POA: Diagnosis not present

## 2016-10-18 DIAGNOSIS — R0600 Dyspnea, unspecified: Secondary | ICD-10-CM | POA: Diagnosis not present

## 2016-10-18 DIAGNOSIS — R0689 Other abnormalities of breathing: Secondary | ICD-10-CM | POA: Diagnosis not present

## 2016-10-18 DIAGNOSIS — R938 Abnormal findings on diagnostic imaging of other specified body structures: Secondary | ICD-10-CM | POA: Diagnosis not present

## 2016-10-18 DIAGNOSIS — R9389 Abnormal findings on diagnostic imaging of other specified body structures: Secondary | ICD-10-CM

## 2016-10-18 NOTE — Telephone Encounter (Signed)
HI Shane [name] are seeing Shane [name] on 11/07/16 as new eval. 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 When I see him back in Jan 12018 I Am likely to recommend lung bx (he is not aware fully of this yet). Noted on Ct he has enlareg Pulm Aterty. Therfore, if you could arrange for right heart cath would be most helpful esp as risk stratification for bx  Thanks  Dr. Brand Males, M.D., [facility].C.P Pulmonary and Critical Care Medicine Staff Physician [place] [facility] Pager: [phone], If no answer or between  15:00h - 7:00h: call [phone]  10/18/2016 11:59 PM

## 2016-10-18 NOTE — Telephone Encounter (Signed)
Tried to call pt. But mailbox was full x1

## 2016-10-18 NOTE — Telephone Encounter (Signed)
Autoimmune labs normal but CK muscle enzyme and aldolase slightly high - could be CRESTOR related. He needs to talk to PCP ARONSON,RICHARD A, MD or his cardiologist. I defintitely recommend a holiday on it till he sees me bck in Jan 2017 at which time we will repeat CK   Dr. Brand Males, M.D., [facility].C.P Pulmonary and Critical Care Medicine Staff Physician [place] [facility] Pager: [phone], If no answer or between  15:00h - 7:00h: call [phone]  10/18/2016 6:07 AM

## 2016-10-20 ENCOUNTER — Telehealth (HOSPITAL_COMMUNITY): Payer: Self-pay

## 2016-10-20 LAB — HYPERSENSITIVITY PNUEMONITIS PROFILE

## 2016-10-20 NOTE — Telephone Encounter (Signed)
Encounter complete. 

## 2016-10-21 ENCOUNTER — Telehealth (HOSPITAL_COMMUNITY): Payer: Self-pay

## 2016-10-21 NOTE — Telephone Encounter (Signed)
Encounter complete. 

## 2016-10-24 NOTE — Telephone Encounter (Signed)
Patient wife called states that she would like a call back to make sure that pt has done all the required things MR asked him to do. She is also concerned that he is doing no better and would like a sooner appt. She can be reached at [phone] - [name] is her name - pr

## 2016-10-24 NOTE — Telephone Encounter (Signed)
Shane [name] calling back [phone] ext [phone] also you can tell the operator she is expecting our call so they don't put [place] to voice mail

## 2016-10-24 NOTE — Telephone Encounter (Signed)
Called and spoke with pt and she is aware of MR recs.  Copies of labs and this phone note have been sent to Dr. [name] per pts wifes request.  pts wife will send a message to Dr. [name] to make him aware to review this information.   pts wife stated that he has cardiology appt on 12/4 and the perfusion 11/22.  She stated that the pt is having a worse time now, and he is hardly sleeping, and the pt feels that he needs to be seen before his appt with MR in January.  There are not any openings on MR schedule.  MR please advise where we could work the pt in.  Thanks  Allergies  Allergen Reactions  . Codeine Hives and Itching

## 2016-10-25 ENCOUNTER — Ambulatory Visit (HOSPITAL_COMMUNITY)
Admission: RE | Admit: 2016-10-25 | Discharge: 2016-10-25 | Disposition: A | Discharge status: Home / Self Care | Payer: Medicare Other | Source: Ambulatory Visit | Attending: Cardiovascular Disease | Admitting: Cardiovascular Disease

## 2016-10-25 DIAGNOSIS — E669 Obesity, unspecified: Secondary | ICD-10-CM | POA: Insufficient documentation

## 2016-10-25 DIAGNOSIS — R0609 Other forms of dyspnea: Secondary | ICD-10-CM

## 2016-10-25 DIAGNOSIS — R42 Dizziness and giddiness: Secondary | ICD-10-CM | POA: Insufficient documentation

## 2016-10-25 DIAGNOSIS — I129 Hypertensive chronic kidney disease with stage 1 through stage 4 chronic kidney disease, or unspecified chronic kidney disease: Secondary | ICD-10-CM | POA: Diagnosis not present

## 2016-10-25 DIAGNOSIS — Z6836 Body mass index (BMI) 36.0-36.9, adult: Secondary | ICD-10-CM | POA: Diagnosis not present

## 2016-10-25 DIAGNOSIS — E1122 Type 2 diabetes mellitus with diabetic chronic kidney disease: Secondary | ICD-10-CM | POA: Insufficient documentation

## 2016-10-25 DIAGNOSIS — N183 Chronic kidney disease, stage 3 (moderate): Secondary | ICD-10-CM | POA: Diagnosis not present

## 2016-10-25 DIAGNOSIS — G4733 Obstructive sleep apnea (adult) (pediatric): Secondary | ICD-10-CM | POA: Insufficient documentation

## 2016-10-25 MED ORDER — TECHNETIUM TC 99M TETROFOSMIN IV KIT
31.9000 | PACK | Freq: Once | INTRAVENOUS | Status: AC | PRN
  Administered 2016-10-25: 31.9 via INTRAVENOUS
  Filled 2016-10-25: qty 32

## 2016-10-25 MED ORDER — REGADENOSON 0.4 MG/5ML IV SOLN
0.4000 mg | Freq: Once | INTRAVENOUS | Status: AC
  Administered 2016-10-25: 0.4 mg via INTRAVENOUS

## 2016-10-25 NOTE — Progress Notes (Signed)
ATC pt. Mailbox full. WCB.

## 2016-10-25 NOTE — Telephone Encounter (Signed)
Let them know that   A ) I spoke to dr Stanford Breed cardiologist over weekend - and that I have recommended heart cath esp to measure pressures in his heart-lung system and this needs to be done first to further understand his symptoms  B) why is he unable to sleep? What symptoms is he having? Does he use CPAP?  Thanks Dr. Brand Males, M.D., [facility].C.P Pulmonary and Critical Care Medicine Staff Physician [place] [facility] Pager: [phone], If no answer or between  15:00h - 7:00h: call [phone]  10/25/2016 4:39 AM

## 2016-10-25 NOTE — Telephone Encounter (Signed)
Called pt. But the mailbox was full x1

## 2016-10-26 ENCOUNTER — Ambulatory Visit (HOSPITAL_COMMUNITY)
Admission: RE | Admit: 2016-10-26 | Discharge: 2016-10-26 | Disposition: A | Discharge status: Home / Self Care | Payer: Medicare Other | Source: Ambulatory Visit | Attending: Internal Medicine | Admitting: Internal Medicine

## 2016-10-26 LAB — MYOCARDIAL PERFUSION IMAGING
LV dias vol: 136 mL (ref 62–150)
LV sys vol: 54 mL
Peak HR: 74 {beats}/min
Rest HR: 59 {beats}/min
SDS: 2
SRS: 0
SSS: 2
TID: 0.93

## 2016-10-26 MED ORDER — TECHNETIUM TC 99M TETROFOSMIN IV KIT
30.7000 | PACK | Freq: Once | INTRAVENOUS | Status: AC | PRN
  Administered 2016-10-26: 30.7 via INTRAVENOUS

## 2016-10-26 NOTE — Progress Notes (Signed)
   HPI: FU dyspnea. 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 Evaluation is in progress. Seen by Rhonda Barrett for dyspnea on November 6. Cardiac catheterization in 2012 showed ejection fraction 45-50%. There was calcification noted in his vessels and they were smaller in diameter consistent with diabetic vascular disease. There was a 60% distal circumflex and 60 PDA. Echocardiogram November 2017 showed normal LV function, moderate left ventricular hypertrophy, grade 1 diastolic dysfunction. Chest CT November 2017 showed interstitial lung disease, coronary atherosclerosis and dilated pulmonary artery. Nuclear study November 2017 showed ejection fraction 60% and no ischemia or infarction. Pulmonary has seen and has requested right heart catheterization as well. Since last seen, patient does have dyspnea on exertion but no orthopnea, PND, pedal edema, chest pain or syncope.  Current Outpatient Prescriptions  Medication Sig Dispense Refill  . aspirin 81 MG tablet Take 81 mg by mouth daily.      . diazepam (VALIUM) 5 MG tablet Take 5 mg by mouth 3 (three) times daily as needed. For sleep    . furosemide (LASIX) 40 MG tablet Take 1 tablet by mouth daily.    . insulin lispro (HUMALOG KWIKPEN) 100 UNIT/ML injection Inject 20 Units into the skin 3 (three) times daily before meals. Sliding scale    . insulin lispro (HUMALOG) 100 UNIT/ML injection Inject 100 Units into the skin continuous. Use over continuous rate in v-go bag over 24 hours    . Insulin Pump Disposable (V-GO 40) KIT 100 Units by Does not apply route daily. humalog 100 units dispensed every 24 hours. Dispose v-go bag after 24 hours of use.    . KLOR-CON M20 20 MEQ tablet Take 1 tablet by mouth daily.    . lisinopril (PRINIVIL,ZESTRIL) 20 MG tablet Take 1 tablet by mouth daily.    . Multiple Vitamin (MULTIVITAMIN) tablet Take 1 tablet by mouth daily.      . nebivolol (BYSTOLIC) 10 MG tablet Take 5 mg by  mouth daily.     . nitroGLYCERIN (NITROSTAT) 0.4 MG SL tablet Place 0.4 mg under the tongue every 5 (five) minutes as needed. For chest pain    . pantoprazole (PROTONIX) 40 MG tablet Take 40 mg by mouth daily.      . PROAIR HFA 108 (90 Base) MCG/ACT inhaler Inhale 2 puffs into the lungs every 4 (four) hours as needed.     . traMADol (ULTRAM) 50 MG tablet Take 50 mg by mouth every 6 (six) hours as needed. Maximum dose= 8 tablets per day      No current facility-administered medications for this visit.     Allergies  Allergen Reactions  . Codeine Hives and Itching     Past Medical History:  Diagnosis Date  . Cataract   . Chest pain   . Chronic kidney disease    d/t DM  . Diabetes mellitus    Vgo disposal insulin bolus  simular to insulin pump  . GERD (gastroesophageal reflux disease)   . Hyperlipidemia   . Hypertension   . Neuromuscular disorder (HCC)   . Sleep apnea     uses cpap asked to bring mask and tubing    Past Surgical History:  Procedure Laterality Date  . ANTERIOR FUSION CERVICAL SPINE  2012  . CARDIAC CATHETERIZATION  2011  . carpel tunnel     left wrist  . CATARACT EXTRACTION W/ INTRAOCULAR LENS  IMPLANT, BILATERAL  2013  . CERVICAL LAMINECTOMY  2012  .   COLONOSCOPY N/A 01/14/2013   Procedure: COLONOSCOPY;  Surgeon: John N Perry, MD;  Location: [facility];  Service: Endoscopy;  Laterality: N/A;  . KNEE SURGERY  1998   left  . LUMBAR LAMINECTOMY  2003  . POSTERIOR FUSION CERVICAL SPINE  2012  . TRIGGER FINGER RELEASE  2011   4th finger left hand    Social History   Social History  . Marital status: Married    Spouse name: N/A  . Number of children: N/A  . Years of education: N/A   Occupational History  . Not on file.   Social History Main Topics  . Smoking status: Never Smoker  . Smokeless tobacco: Never Used  . Alcohol use No  . Drug use: No  . Sexual activity: Not on file   Other Topics Concern  . 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     Family History  Problem Relation Age of Onset  . Diabetes Mellitus II Mother   . Emphysema Father 67  . Colon cancer Neg Hx   . Esophageal cancer Neg Hx   . Rectal cancer Neg Hx   . Stomach cancer Neg Hx     ROS: no fevers or chills, productive cough, hemoptysis, dysphasia, odynophagia, melena, hematochezia, dysuria, hematuria, rash, seizure activity, orthopnea, PND, pedal edema, claudication. Remaining systems are negative.  Physical Exam:   Blood pressure (!) 160/76, pulse 78, height 6' (1.829 m), weight 277 lb (125.6 kg).  General:  Well developed/obese in NAD Skin warm/dry Patient not depressed No peripheral clubbing Back-normal HEENT-normal/normal eyelids Neck supple/normal carotid upstroke bilaterally; no bruits; no JVD; no thyromegaly chest - CTA/ normal expansion CV - RRR/normal S1 and S2; no murmurs, rubs or gallops;  PMI nondisplaced Abdomen -NT/ND, no HSM, no mass, + bowel sounds, no bruit 2+ femoral pulses, no bruits Ext-no edema, chords, 2+ DP Neuro-grossly nonfocal  A/P  1 dyspnea- Patient's previous cardiac catheterization revealed no obstructive coronary disease. Nuclear study shows no ischemia and his ejection fraction is preserved. His chest CT shows interstitial lung disease which is the likely etiology. Pulmonary requests a right heart catheterization and we will arrange. I have considered left heart catheterization for definitive exclusion of coronary disease given that it has been 5 years since his previous catheterization. However he is not having chest pain and his nuclear study shows no ischemia. He has baseline renal insufficiency and would be at risk for contrast nephropathy. I have elected therefore not to proceed with left heart catheterization.  2 hypertension-blood pressure is elevated today but he follows this and it is typically controlled. Continue present medications and follow.  3 diabetes  mellitus-management per primary care. He would benefit from a statin long-term. I will leave this to primary care.  Brian Crenshaw, MD  

## 2016-10-31 ENCOUNTER — Encounter: Payer: Self-pay | Admitting: Cardiology

## 2016-11-04 DIAGNOSIS — J84112 Idiopathic pulmonary fibrosis: Secondary | ICD-10-CM

## 2016-11-04 HISTORY — DX: Idiopathic pulmonary fibrosis: J84.112

## 2016-11-04 NOTE — Progress Notes (Signed)
ATC pt. Mailbox is full. WCB.

## 2016-11-07 ENCOUNTER — Ambulatory Visit (INDEPENDENT_AMBULATORY_CARE_PROVIDER_SITE_OTHER): Payer: Medicare Other | Admitting: Cardiology

## 2016-11-07 ENCOUNTER — Encounter: Payer: Self-pay | Admitting: Cardiology

## 2016-11-07 ENCOUNTER — Other Ambulatory Visit: Payer: Self-pay | Admitting: *Deleted

## 2016-11-07 ENCOUNTER — Encounter: Payer: Self-pay | Admitting: *Deleted

## 2016-11-07 VITALS — BP 160/76 | HR 78 | Ht 72.0 in | Wt 277.0 lb

## 2016-11-07 DIAGNOSIS — I10 Essential (primary) hypertension: Secondary | ICD-10-CM

## 2016-11-07 DIAGNOSIS — R0609 Other forms of dyspnea: Secondary | ICD-10-CM

## 2016-11-07 DIAGNOSIS — R0600 Dyspnea, unspecified: Secondary | ICD-10-CM

## 2016-11-07 DIAGNOSIS — R0602 Shortness of breath: Secondary | ICD-10-CM

## 2016-11-07 DIAGNOSIS — J849 Interstitial pulmonary disease, unspecified: Secondary | ICD-10-CM

## 2016-11-07 LAB — CBC
HCT: 42.8 % (ref 38.5–50.0)
Hemoglobin: 14.3 g/dL (ref 13.2–17.1)
MCH: 30.8 pg (ref 27.0–33.0)
MCHC: 33.4 g/dL (ref 32.0–36.0)
MCV: 92 fL (ref 80.0–100.0)
MPV: 9.2 fL (ref 7.5–12.5)
Platelets: 251 10*3/uL (ref 140–400)
RBC: 4.65 MIL/uL (ref 4.20–5.80)
RDW: 13.7 % (ref 11.0–15.0)
WBC: 7.9 10*3/uL (ref 3.8–10.8)

## 2016-11-07 LAB — PROTIME-INR
INR: 1
Prothrombin Time: 10.6 s (ref 9.0–11.5)

## 2016-11-07 LAB — BASIC METABOLIC PANEL
BUN: 23 mg/dL (ref 7–25)
CO2: 28 mmol/L (ref 20–31)
Calcium: 9.3 mg/dL (ref 8.6–10.3)
Chloride: 99 mmol/L (ref 98–110)
Creat: 1.35 mg/dL — ABNORMAL HIGH (ref 0.70–1.18)
Glucose, Bld: 151 mg/dL — ABNORMAL HIGH (ref 65–99)
Potassium: 4.6 mmol/L (ref 3.5–5.3)
Sodium: 136 mmol/L (ref 135–146)

## 2016-11-07 NOTE — Patient Instructions (Signed)
Medication Instructions:   NO CHANGE  Labwork:  Your physician recommends that you HAVE LAB WORK TODAY  Testing/Procedures:  Your physician has requested that you have a cardiac catheterization. Cardiac catheterization is used to diagnose and/or treat various heart conditions. Doctors may recommend this procedure for a number of different reasons. The most common reason is to evaluate chest pain. Chest pain can be a symptom of coronary artery disease (CAD), and cardiac catheterization can show whether plaque is narrowing or blocking your heart's arteries. This procedure is also used to evaluate the valves, as well as measure the blood flow and oxygen levels in different parts of your heart. For further information please visit [email or web address]. Please follow instruction sheet, as given.    Follow-Up:  Your physician recommends that you schedule a follow-up appointment in: 3-4 MONTHS WITH DR Stanford Breed

## 2016-11-08 ENCOUNTER — Telehealth: Payer: Self-pay | Admitting: Cardiology

## 2016-11-08 NOTE — Telephone Encounter (Signed)
Shane [name] ( Wife) is calling because she has some questions about [name] Cardiac Cath on tomorrow . Please call  FYI . Please tell operator that she is expecting the call and to not send it to voicemail . [phone].. Thanks

## 2016-11-08 NOTE — Telephone Encounter (Signed)
Spoke with wife [name] and discussed cath tomorrow. Advised technically patient should have someone stay with him at home after procedure tomorrow, verbalized understanding

## 2016-11-09 ENCOUNTER — Encounter (HOSPITAL_COMMUNITY)
Admission: RE | Disposition: A | Discharge status: Home / Self Care | Payer: Self-pay | Source: Ambulatory Visit | Attending: Interventional Cardiology

## 2016-11-09 ENCOUNTER — Ambulatory Visit (HOSPITAL_COMMUNITY)
Admission: RE | Admit: 2016-11-09 | Discharge: 2016-11-09 | Disposition: A | Discharge status: Home / Self Care | Payer: Medicare Other | Source: Ambulatory Visit | Attending: Interventional Cardiology | Admitting: Interventional Cardiology

## 2016-11-09 DIAGNOSIS — Z794 Long term (current) use of insulin: Secondary | ICD-10-CM | POA: Diagnosis not present

## 2016-11-09 DIAGNOSIS — J849 Interstitial pulmonary disease, unspecified: Secondary | ICD-10-CM | POA: Diagnosis not present

## 2016-11-09 DIAGNOSIS — Z7982 Long term (current) use of aspirin: Secondary | ICD-10-CM | POA: Diagnosis not present

## 2016-11-09 DIAGNOSIS — G4730 Sleep apnea, unspecified: Secondary | ICD-10-CM | POA: Insufficient documentation

## 2016-11-09 DIAGNOSIS — E785 Hyperlipidemia, unspecified: Secondary | ICD-10-CM | POA: Diagnosis not present

## 2016-11-09 DIAGNOSIS — R0600 Dyspnea, unspecified: Secondary | ICD-10-CM | POA: Diagnosis present

## 2016-11-09 DIAGNOSIS — N189 Chronic kidney disease, unspecified: Secondary | ICD-10-CM | POA: Insufficient documentation

## 2016-11-09 DIAGNOSIS — R0689 Other abnormalities of breathing: Secondary | ICD-10-CM | POA: Diagnosis present

## 2016-11-09 DIAGNOSIS — K219 Gastro-esophageal reflux disease without esophagitis: Secondary | ICD-10-CM | POA: Diagnosis not present

## 2016-11-09 DIAGNOSIS — I2510 Atherosclerotic heart disease of native coronary artery without angina pectoris: Secondary | ICD-10-CM | POA: Diagnosis not present

## 2016-11-09 DIAGNOSIS — R9389 Abnormal findings on diagnostic imaging of other specified body structures: Secondary | ICD-10-CM | POA: Diagnosis present

## 2016-11-09 DIAGNOSIS — I272 Pulmonary hypertension, unspecified: Secondary | ICD-10-CM | POA: Diagnosis present

## 2016-11-09 DIAGNOSIS — I129 Hypertensive chronic kidney disease with stage 1 through stage 4 chronic kidney disease, or unspecified chronic kidney disease: Secondary | ICD-10-CM | POA: Diagnosis not present

## 2016-11-09 HISTORY — PX: CARDIAC CATHETERIZATION: SHX172

## 2016-11-09 LAB — POCT I-STAT 3, VENOUS BLOOD GAS (G3P V)
Acid-Base Excess: 2 mmol/L (ref 0.0–2.0)
Acid-Base Excess: 4 mmol/L — ABNORMAL HIGH (ref 0.0–2.0)
Bicarbonate: 28 mmol/L (ref 20.0–28.0)
Bicarbonate: 30 mmol/L — ABNORMAL HIGH (ref 20.0–28.0)
O2 Saturation: 59 %
O2 Saturation: 61 %
TCO2: 29 mmol/L (ref 0–100)
TCO2: 31 mmol/L (ref 0–100)
pCO2, Ven: 48.3 mmHg (ref 44.0–60.0)
pCO2, Ven: 48.8 mmHg (ref 44.0–60.0)
pH, Ven: 7.371 (ref 7.250–7.430)
pH, Ven: 7.397 (ref 7.250–7.430)
pO2, Ven: 32 mmHg (ref 32.0–45.0)
pO2, Ven: 32 mmHg (ref 32.0–45.0)

## 2016-11-09 LAB — GLUCOSE, CAPILLARY
Glucose-Capillary: 198 mg/dL — ABNORMAL HIGH (ref 65–99)
Glucose-Capillary: 169 mg/dL — ABNORMAL HIGH (ref 65–99)
Glucose-Capillary: 131 mg/dL — ABNORMAL HIGH (ref 65–99)

## 2016-11-09 SURGERY — RIGHT HEART CATH

## 2016-11-09 MED ORDER — ASPIRIN 81 MG PO CHEW: 81.0000 mg | CHEWABLE_TABLET | ORAL | Status: DC

## 2016-11-09 MED ORDER — FENTANYL CITRATE (PF) 100 MCG/2ML IJ SOLN
INTRAMUSCULAR | Status: DC | PRN
  Administered 2016-11-09: 50 ug via INTRAVENOUS

## 2016-11-09 MED ORDER — LIDOCAINE HCL (PF) 1 % IJ SOLN
INTRAMUSCULAR | Status: AC
  Filled 2016-11-09: qty 30

## 2016-11-09 MED ORDER — LIDOCAINE HCL (PF) 1 % IJ SOLN
INTRAMUSCULAR | Status: DC | PRN
  Administered 2016-11-09: 5 mL via SUBCUTANEOUS

## 2016-11-09 MED ORDER — HEPARIN (PORCINE) IN NACL 2-0.9 UNIT/ML-% IJ SOLN
INTRAMUSCULAR | Status: AC
  Filled 2016-11-09: qty 500

## 2016-11-09 MED ORDER — SODIUM CHLORIDE 0.9 % IV SOLN: INTRAVENOUS | Status: DC

## 2016-11-09 MED ORDER — MIDAZOLAM HCL 2 MG/2ML IJ SOLN
INTRAMUSCULAR | Status: AC
  Filled 2016-11-09: qty 2

## 2016-11-09 MED ORDER — SODIUM CHLORIDE 0.9% FLUSH: 3.0000 mL | INTRAVENOUS | Status: DC | PRN

## 2016-11-09 MED ORDER — ONDANSETRON HCL 4 MG/2ML IJ SOLN: 4.0000 mg | Freq: Four times a day (QID) | INTRAMUSCULAR | Status: DC | PRN

## 2016-11-09 MED ORDER — ACETAMINOPHEN 325 MG PO TABS: 650.0000 mg | ORAL_TABLET | ORAL | Status: DC | PRN

## 2016-11-09 MED ORDER — MIDAZOLAM HCL 2 MG/2ML IJ SOLN
INTRAMUSCULAR | Status: DC | PRN
  Administered 2016-11-09: 1 mg via INTRAVENOUS

## 2016-11-09 MED ORDER — SODIUM CHLORIDE 0.9% FLUSH: 3.0000 mL | Freq: Two times a day (BID) | INTRAVENOUS | Status: DC

## 2016-11-09 MED ORDER — FENTANYL CITRATE (PF) 100 MCG/2ML IJ SOLN
INTRAMUSCULAR | Status: AC
  Filled 2016-11-09: qty 2

## 2016-11-09 MED ORDER — SODIUM CHLORIDE 0.9 % IV SOLN: 250.0000 mL | INTRAVENOUS | Status: DC | PRN

## 2016-11-09 MED ORDER — SODIUM CHLORIDE 0.9 % IV SOLN
INTRAVENOUS | Status: DC
  Administered 2016-11-09: 10:00:00 via INTRAVENOUS

## 2016-11-09 MED ORDER — HEPARIN (PORCINE) IN NACL 2-0.9 UNIT/ML-% IJ SOLN
INTRAMUSCULAR | Status: DC | PRN
  Administered 2016-11-09: 15:00:00

## 2016-11-09 SURGICAL SUPPLY — 7 items
CATH BALLN WEDGE 5F 110CM (CATHETERS) ×1 IMPLANT
PACK CARDIAC CATHETERIZATION (CUSTOM PROCEDURE TRAY) ×2 IMPLANT
PROTECTION STATION PRESSURIZED (MISCELLANEOUS) ×2
SHEATH FAST CATH BRACH 5F 5CM (SHEATH) ×1 IMPLANT
STATION PROTECTION PRESSURIZED (MISCELLANEOUS) IMPLANT
TUBING ART PRESS 72  MALE/FEM (TUBING) ×1
TUBING ART PRESS 72 MALE/FEM (TUBING) IMPLANT

## 2016-11-09 NOTE — Progress Notes (Signed)
Dr. Tamala Julian in to talk to pt and wife.  Pt can go home at 1600 per Dr. Tamala Julian.

## 2016-11-09 NOTE — H&P (View-Only) (Signed)
HPI: FU dyspnea. 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 Evaluation is in progress. Seen by Rosaria Ferries for dyspnea on November 6. Cardiac catheterization in 2012 showed ejection fraction 45-50%. There was calcification noted in his vessels and they were smaller in diameter consistent with diabetic vascular disease. There was a 60% distal circumflex and 60 PDA. Echocardiogram November 2017 showed normal LV function, moderate left ventricular hypertrophy, grade 1 diastolic dysfunction. Chest CT November 2017 showed interstitial lung disease, coronary atherosclerosis and dilated pulmonary artery. Nuclear study November 2017 showed ejection fraction 60% and no ischemia or infarction. Pulmonary has seen and has requested right heart catheterization as well. Since last seen, patient does have dyspnea on exertion but no orthopnea, PND, pedal edema, chest pain or syncope.  Current Outpatient Prescriptions  Medication Sig Dispense Refill  . aspirin 81 MG tablet Take 81 mg by mouth daily.      . diazepam (VALIUM) 5 MG tablet Take 5 mg by mouth 3 (three) times daily as needed. For sleep    . furosemide (LASIX) 40 MG tablet Take 1 tablet by mouth daily.    . insulin lispro (HUMALOG KWIKPEN) 100 UNIT/ML injection Inject 20 Units into the skin 3 (three) times daily before meals. Sliding scale    . insulin lispro (HUMALOG) 100 UNIT/ML injection Inject 100 Units into the skin continuous. Use over continuous rate in v-go bag over 24 hours    . Insulin Pump Disposable (V-GO 40) KIT 100 Units by Does not apply route daily. humalog 100 units dispensed every 24 hours. Dispose v-go bag after 24 hours of use.    Marland Kitchen KLOR-CON M20 20 MEQ tablet Take 1 tablet by mouth daily.    Marland Kitchen lisinopril (PRINIVIL,ZESTRIL) 20 MG tablet Take 1 tablet by mouth daily.    . Multiple Vitamin (MULTIVITAMIN) tablet Take 1 tablet by mouth daily.      . nebivolol (BYSTOLIC) 10 MG tablet Take 5 mg by  mouth daily.     . nitroGLYCERIN (NITROSTAT) 0.4 MG SL tablet Place 0.4 mg under the tongue every 5 (five) minutes as needed. For chest pain    . pantoprazole (PROTONIX) 40 MG tablet Take 40 mg by mouth daily.      Marland Kitchen PROAIR HFA 108 (90 Base) MCG/ACT inhaler Inhale 2 puffs into the lungs every 4 (four) hours as needed.     . traMADol (ULTRAM) 50 MG tablet Take 50 mg by mouth every 6 (six) hours as needed. Maximum dose= 8 tablets per day      No current facility-administered medications for this visit.     Allergies  Allergen Reactions  . Codeine Hives and Itching     Past Medical History:  Diagnosis Date  . Cataract   . Chest pain   . Chronic kidney disease    d/t DM  . Diabetes mellitus    Vgo disposal insulin bolus  simular to insulin pump  . GERD (gastroesophageal reflux disease)   . Hyperlipidemia   . Hypertension   . Neuromuscular disorder ([place])   . Sleep apnea     uses cpap asked to bring mask and tubing    Past Surgical History:  Procedure Laterality Date  . ANTERIOR FUSION CERVICAL SPINE  2012  . CARDIAC CATHETERIZATION  2011  . carpel tunnel     left wrist  . CATARACT EXTRACTION W/ INTRAOCULAR LENS  IMPLANT, BILATERAL  2013  . CERVICAL LAMINECTOMY  2012  .  COLONOSCOPY N/A 01/14/2013   Procedure: COLONOSCOPY;  Surgeon: Irene Shipper, MD;  Location: [facility];  Service: Endoscopy;  Laterality: N/A;  . [place]   left  . LUMBAR LAMINECTOMY  2003  . POSTERIOR FUSION CERVICAL SPINE  2012  . TRIGGER FINGER RELEASE  2011   4th finger left hand    Social History   Social History  . Marital status: Married    Spouse name: N/A  . Number of children: N/A  . Years of education: N/A   Occupational History  . Not on file.   Social History Main Topics  . Smoking status: Never Smoker  . Smokeless tobacco: Never Used  . Alcohol use No  . Drug use: No  . Sexual activity: Not on file   Other Topics Concern  . 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     Family History  Problem Relation Age of Onset  . Diabetes Mellitus II Mother   . Emphysema Father 58  . Colon cancer Neg Hx   . Esophageal cancer Neg Hx   . Rectal cancer Neg Hx   . Stomach cancer Neg Hx     ROS: no fevers or chills, productive cough, hemoptysis, dysphasia, odynophagia, melena, hematochezia, dysuria, hematuria, rash, seizure activity, orthopnea, PND, pedal edema, claudication. Remaining systems are negative.  Physical Exam:   Blood pressure (!) 160/76, pulse 78, height 6' (1.829 m), weight 277 lb (125.6 kg).  General:  Well developed/obese in NAD Skin warm/dry Patient not depressed No peripheral clubbing Back-normal HEENT-normal/normal eyelids Neck supple/normal carotid upstroke bilaterally; no bruits; no JVD; no thyromegaly chest - CTA/ normal expansion CV - RRR/normal S1 and S2; no murmurs, rubs or gallops;  PMI nondisplaced Abdomen -NT/ND, no HSM, no mass, + bowel sounds, no bruit 2+ femoral pulses, no bruits Ext-no edema, chords, 2+ DP Neuro-grossly nonfocal  A/P  1 dyspnea- Patient's previous cardiac catheterization revealed no obstructive coronary disease. Nuclear study shows no ischemia and his ejection fraction is preserved. His chest CT shows interstitial lung disease which is the likely etiology. Pulmonary requests a right heart catheterization and we will arrange. I have considered left heart catheterization for definitive exclusion of coronary disease given that it has been 5 years since his previous catheterization. However he is not having chest pain and his nuclear study shows no ischemia. He has baseline renal insufficiency and would be at risk for contrast nephropathy. I have elected therefore not to proceed with left heart catheterization.  2 hypertension-blood pressure is elevated today but he follows this and it is typically controlled. Continue present medications and follow.  3 diabetes  mellitus-management per primary care. He would benefit from a statin long-term. I will leave this to primary care.  Kirk Ruths, MD

## 2016-11-09 NOTE — CV Procedure (Signed)
   Right heart cath performed via the right antecubital vein via a previously placed Angiocath.  Pressures are mildly elevated in the pulmonary artery. Normal pulmonary capillary wedge pressure. Low normal cardiac output.  No complications.

## 2016-11-09 NOTE — Interval H&P Note (Signed)
History and Physical Interval Note:  11/09/2016 2:31 PM  Shane [name]  has presented today for surgery, with the diagnosis of lung disease - shortness of breath  The various methods of treatment have been discussed with the patient and family. After consideration of risks, benefits and other options for treatment, the patient has consented to  Procedure(s): Right Heart Cath (N/A) as a surgical intervention .  The patient's history has been reviewed, patient examined, no change in status, stable for surgery.  I have reviewed the patient's chart and labs.  Questions were answered to the patient's satisfaction.     Belva Crome III

## 2016-11-09 NOTE — Discharge Instructions (Signed)

## 2016-11-10 ENCOUNTER — Encounter (HOSPITAL_COMMUNITY): Payer: Self-pay | Admitting: Interventional Cardiology

## 2016-11-14 NOTE — Telephone Encounter (Signed)
Spoke with the pt's spouse  She states that pt wants sooner appt with MR b/c his breathing is not improving  I advised there is nothing sooner than what he is already scheduled (12/20/16) I offered to sched with NP and she states will speak with pt tonight and call back if he wants to see NP  MR- did you intend to send your msg below to another provider? It went to triage

## 2016-11-15 NOTE — Telephone Encounter (Signed)
Will hold in triage and send to Daneil Dan to work on in the AM on getting the patient scheduled. Needs to be worked in on MR schedule -- DB or schedule in early part of day or late afternoon.

## 2016-11-15 NOTE — Telephone Encounter (Signed)
PASP 36 and slightly high. Work with Daneil Dan and see how soon you can bring him. If add onI like 8.45am or last for the day instead of random double bookiung

## 2016-11-16 NOTE — Telephone Encounter (Signed)
MR you have 25 pt's on your next clinic day (12/20) and there is a DB in the afternoon.   Please advise if you would like to still DB or can APP see pt. Thanks.

## 2016-11-17 NOTE — Telephone Encounter (Signed)
I Can see him 11/21/16 in the PM - say 2pm after I finish hospital rounds and cme over from [place]  Dr. Brand Males, M.D., [facility].C.P Pulmonary and Critical Care Medicine Staff Physician [place] Pager: [phone], If no answer or between  15:00h - 7:00h: call [phone]  11/17/2016 3:58 PM

## 2016-11-17 NOTE — Telephone Encounter (Signed)
Spoke with pt's wife, [name]. She is aware of MR's response. I have scheduled the pt on 11/21/16 at 2:15pm. I will route this message back to MR to make him aware.

## 2016-11-18 NOTE — Telephone Encounter (Signed)
Something has come up and will be tough to do 11/21/16  . So, I d./w Daneil Dan who is looking at moving hm to wed 11/23/16 wehre thiere is opening. . If she cannot do that we can look at 11/22/16 early afternoon or maybe 11/24/16 but I would need to confirm that afte Daneil Dan talks to patient. . Please send this to [facility] to deal with  Dr. Brand Males, M.D., [facility].C.P Pulmonary and Critical Care Medicine Staff Physician [place] [facility] Pager: [phone], If no answer or between  15:00h - 7:00h: call [phone]  11/18/2016 11:48 AM

## 2016-11-18 NOTE — Telephone Encounter (Signed)
Called and spoke to pt's wife. Informed her of the change. Appt made with MR on 11/23/16. Pt's wife verbalized understanding and denied any further questions or concerns at this time.

## 2016-11-18 NOTE — Telephone Encounter (Signed)
Will forward to [place] per MR request to follow up on scheduling.

## 2016-11-21 ENCOUNTER — Ambulatory Visit: Payer: Medicare Other | Admitting: Internal Medicine

## 2016-11-22 ENCOUNTER — Encounter: Payer: Self-pay | Admitting: Emergency Medicine

## 2016-11-22 NOTE — Progress Notes (Signed)
ATC pt. Letter has been mailed to have pt contact out office. Will sign off.

## 2016-11-23 ENCOUNTER — Ambulatory Visit (INDEPENDENT_AMBULATORY_CARE_PROVIDER_SITE_OTHER): Payer: Medicare Other | Admitting: Internal Medicine

## 2016-11-23 ENCOUNTER — Encounter: Payer: Self-pay | Admitting: Internal Medicine

## 2016-11-23 VITALS — BP 124/72 | HR 68 | Ht 72.0 in | Wt 271.0 lb

## 2016-11-23 DIAGNOSIS — J849 Interstitial pulmonary disease, unspecified: Secondary | ICD-10-CM | POA: Diagnosis not present

## 2016-11-23 NOTE — Patient Instructions (Signed)
ICD-9-CM ICD-10-CM   1. ILD (interstitial lung disease) [facility]) 45 J84.9     Refer Dr Roxan Hockey for evaluation of surgical lung biopsy -to do in Jan 2018 Refer pulmonary rehab at [facility] test on Room Air - if drops then o2 treatment will help  followup  - 1 -2 weeks after date of biopsy - give appt 4-6 weeks from now

## 2016-11-23 NOTE — Progress Notes (Signed)
Subjective:     Patient ID: Shane [name], male   DOB: 1941/11/12, 75 y.o.   MRN: [id number]  HPI  PCP ARONSON,RICHARD A, MD  HPI   IOV 10/12/2016  Chief Complaint  Patient presents with  . Pulomary Consult    Pt referred by Dr. [name] for DOE x months. pt states the SOB has worsened in the last couple weeks. Pt c/o dry cough. pt deneis f/c/s.     75 year old male referred for dyspnea. Reports that he is obese on CPAP on RA for years. 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 He has new onset dyspnea and cough. Details in ACCP ILD questionnaire below. CXR recently personally visualized shows ILD changes. Reports that pneumonia and lasix Rx by PCP have not helped. Review of old CT chest 12/05/2008 shows bibasal ILD without honeuycombing on non HRCT chest. Possibly datin back to 2007 CT abd lung cut. Current findings are worse.   Symptos:  Dyspnea is new x 4-5 months gets dyspneic hurrying on level ground or stops at 100 yerds. Cough x 3 weeks with cough at night and is dry and can be episodic with severe attacks.   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 Did get xposed to insecticide, fertiiler, talc pain, cement, tile    OV  11/23/2016  Chief Complaint  Patient presents with  . Follow-up    Pt here after PFT, HRCT, RHC. Pt state his breathing has slightly improved. Pt c/o increase in dry cough. Pt deneis CP/tightness and f/c/s.    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 He and his wife stated that now he is getting progressively more short  of breath. Although he does tell me that when he tries to work on his car and he tries to push himself he does start feeling better but he feels that he is deconditioned but is unable to condition himself more in order to feel less short of breath. Wife though feels that his dyspnea is more progressive.  In terms of disease etiology:   - He had high resolution CT chest 10/08/2016 and the CT findings are not consistent with UIP. And that is chronicity to this disease with presence even 7 years ago. This mild progression only since then. He had autoimmune profile which is essentially negative.  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 He will only walk 2 laps and stopped because of shortness of breath. But his pulse ox did not drop below 95%. Resting pulse ox was 99%. Peak heart rate was 102. In essence that was clinically insignificant desaturation   - Results for Shane, [name] (MRN [id number]) as of 11/23/2016 12:02  Ref. Range 10/14/2016 08:19  FVC-Pre Latest Units: L 3.00  FVC-%Pred-Pre Latest Units: % 63   Results for Shane, [name] (MRN [id number]) as of 11/23/2016 12:02  Ref. Range 10/14/2016 08:19  TLC Latest Units: L 4.62  TLC % pred Latest Units: % 60   Results for Shane, [name] (MRN [id number]) as of 11/23/2016 12:02  Ref. Range 10/14/2016 08:19  DLCO unc Latest Units: ml/min/mmHg 16.82  DLCO unc % pred Latest Units: % 46     In terms of operative risk for surgical lung biopsy   - His right heart cath shows normal mean pulmonary artery pressure 20 mm with slight elevation pulmonary artery systolic pressure to 34 mmHg and a normal wedge pressure.    - His Myoview is  reported normal in November 2017    - He does have sleep apnea and uses CPA   - He does have gait instability issues because of his chronic spine disease but this is stable   [place] [facility] [employer]     Objective:   Physical Exam Vitals:   11/23/16 1142  BP: 124/72  Pulse: 68  SpO2: 98%  Weight: 271 lb (122.9 kg)  Height: 6' (1.829 m)   Obese Class 4 mallampati Big neck Viscaerl obeisty GAit imbalance +     Assessment:       ICD-9-CM ICD-10-CM   1. ILD (interstitial lung disease) ([place]) 515 J84.9 Ambulatory referral to Cardiothoracic Surgery        Plan:       his age, male gender and negative autoimmune profile and negative ACCP interstitial lung disease questionnaire increase the pretest probability that he has idiopathic only fibrosis. However the CT scan is not consistent with UIP per ATS criteria. In addition he's had this for 7 years with only mild progression. These 2 features argue against UIP/IPF. There for surgical lung biopsy is indicated.  We discussed briefly about referral to Dr. Modesto Charon and also the method of surgical lung biopsy and the expected postoperative course and complications. He had his wife are in acceptance of this. We discussed the need for CPAP and monitoring postprocedure and the potential risks that include ILD flareup, bleeding, infection risk, bronchopleural fistula. I've told him that Dr. Roxan Hockey both of these in detail I also counseled him about the risk of non-diagnosis which is around 10% but surgical lung biopsy is the gold standard. At this point I do not see any contraindications surgical lung biopsy  Therapeutic options will be discussed after the results of surgical lung biopsy which will be read at  [facility]    He will be a candidate for the brave ILD study that's looking at diagnosing UIP using bronchoscopy. However we will approach him about this once he consents to the surgical lung biopsy after hearing  about the risks of the standard of care   In terms of his dyspnea think he'll benefit from pulmonary rehabilitation we have referred  > 50% of this > 25 min visit spent in face to face counseling or coordination of care    Dr. Brand Males, M.D., [facility].C.P Pulmonary and Critical Care Medicine Staff Physician [place] [facility] Pager: [phone], If no answer or between  15:00h - 7:00h: call [phone]  11/23/2016 1:14 PM

## 2016-12-01 ENCOUNTER — Encounter: Payer: Self-pay | Admitting: Thoracic Surgery (Cardiothoracic Vascular Surgery)

## 2016-12-01 ENCOUNTER — Institutional Professional Consult (permissible substitution) (INDEPENDENT_AMBULATORY_CARE_PROVIDER_SITE_OTHER): Payer: Medicare Other | Admitting: Thoracic Surgery (Cardiothoracic Vascular Surgery)

## 2016-12-01 VITALS — BP 110/60 | HR 79 | Resp 16 | Ht 72.0 in | Wt 270.0 lb

## 2016-12-01 DIAGNOSIS — J849 Interstitial pulmonary disease, unspecified: Secondary | ICD-10-CM

## 2016-12-01 NOTE — Progress Notes (Addendum)
PCP is ARONSON,RICHARD A, MD Referring Provider is Ramaswamy, Murali, MD  Chief Complaint  Patient presents with  . Interstitial Lung Disease    Surgical eval...HI RESOLUTION CT CHEST 10/18/16, PFT 10/14/16    HPI: Mr. Culliton is a 75-year-old gentleman sent for consultation for a lung biopsy for interstitial lung disease.  Mr. 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 He has been having problem with shortness of breath with exertion for about the past 4-5 months. He notes it significantly worsened over the past 3-4 weeks. He'll get short of breath with even minor exertion, sometimes even walking 15-20 feet. He also has had a dry cough which is periodically severe. He saw Dr. Ramaswamy. He was concerned for possible interstitial lung disease due to the appearance of his chest x-ray. He ordered an autoimmune panel and a high-resolution CT. The autoimmune panel was not diagnostic. The CT showed interstitial lung disease. It was not characteristic for UIP.  In addition to the pulmonary workup he had a cardiac workup which included an echocardiogram on November 1. It showed preserved left ventricular function with grade 1 diastolic dysfunction. Aortic valve is thickened but there was no stenosis. He had a stress test on 10/26/2016 which was normal. Right heart catheterization was done on 11/09/2016. It showed mild pulmonary hypertension with a systolic of 36 mmHg.  Past Medical History:  Diagnosis Date  . Cataract   . Chest pain   . Chronic kidney disease    d/t DM  . Diabetes mellitus    Vgo disposal insulin bolus  simular to insulin pump  . GERD (gastroesophageal reflux disease)   . Hyperlipidemia   . Hypertension   . Neuromuscular disorder (HCC)   . Sleep apnea     uses cpap asked to bring mask and tubing     Past Surgical History:  Procedure Laterality Date  . ANTERIOR FUSION CERVICAL SPINE  2012  . CARDIAC CATHETERIZATION  2011  . CARDIAC CATHETERIZATION N/A 11/09/2016   Procedure: Right Heart Cath;  Surgeon: Henry W Smith, MD;  Location: [facility]ASIVE [facility];  Service: Cardiovascular;  Laterality: N/A;  . carpel tunnel     left wrist  . CATARACT EXTRACTION W/ INTRAOCULAR LENS  IMPLANT, BILATERAL  2013  . CERVICAL LAMINECTOMY  2012  . COLONOSCOPY N/A 01/14/2013   Procedure: COLONOSCOPY;  Surgeon: John N Perry, MD;  Location: [facility] ENDOSCOPY;  Service: Endoscopy;  Laterality: N/A;  . KNEE SURGERY  1998   left  . LUMBAR LAMINECTOMY  2003  . POSTERIOR FUSION CERVICAL SPINE  2012  . TRIGGER FINGER RELEASE  2011   4th finger left hand    Family History  Problem Relation Age of Onset  . Diabetes Mellitus II Mother   . Emphysema Father 67  . Colon cancer Neg Hx   . Esophageal cancer Neg Hx   . Rectal cancer Neg Hx   . Stomach cancer Neg Hx     Social History Social History  Substance Use Topics  . Smoking status: Never Smoker  . Smokeless tobacco: Never Used  . Alcohol use No    Current Outpatient Prescriptions  Medication Sig Dispense Refill  . aspirin 81 MG tablet Take 81 mg by mouth daily.      . dapagliflozin propanediol (FARXIGA) 5 MG TABS tablet Take 5 mg by mouth daily.    .   diazepam (VALIUM) 5 MG tablet Take 5 mg by mouth 3 (three) times daily as needed (for sleep).     . furosemide (LASIX) 40 MG tablet Take 40 mg by mouth daily.     . insulin lispro (HUMALOG KWIKPEN) 100 UNIT/ML injection Inject 20 Units into the skin 3 (three) times daily before meals. Sliding scale    . insulin lispro (HUMALOG) 100 UNIT/ML injection Inject 100 Units into the skin continuous. Use over continuous rate in v-go bag over 24 hours    . Insulin Pump Disposable (V-GO 40) KIT 100 Units by Does not apply route daily. humalog 100 units dispensed every 24 hours. Dispose v-go bag after 24 hours of  use.    . KLOR-CON M20 20 MEQ tablet Take 20 mEq by mouth daily.     . lisinopril (PRINIVIL,ZESTRIL) 20 MG tablet Take 20 mg by mouth daily.     . Multiple Vitamin (MULTIVITAMIN) tablet Take 1 tablet by mouth daily.      . nebivolol (BYSTOLIC) 10 MG tablet Take 5 mg by mouth daily.     . nitroGLYCERIN (NITROSTAT) 0.4 MG SL tablet Place 0.4 mg under the tongue every 5 (five) minutes as needed. For chest pain    . pantoprazole (PROTONIX) 40 MG tablet Take 40 mg by mouth daily.      . PROAIR HFA 108 (90 Base) MCG/ACT inhaler Inhale 2 puffs into the lungs every 4 (four) hours as needed for wheezing or shortness of breath.     . promethazine-phenylephrine (PROMETHAZINE VC PLAIN) 6.25-5 MG/5ML SYRP Take 5 mLs by mouth every 4 (four) hours as needed for congestion.     No current facility-administered medications for this visit.     Allergies  Allergen Reactions  . Codeine Hives and Itching    Review of Systems  Constitutional: Positive for activity change and fatigue. Negative for chills, fever and unexpected weight change.  HENT: Positive for dental problem and hearing loss. Negative for trouble swallowing and voice change.   Eyes: Negative for visual disturbance.  Respiratory: Positive for apnea, cough (Nonproductive) and shortness of breath. Negative for wheezing.   Cardiovascular: Negative for chest pain, palpitations and leg swelling.  Gastrointestinal: Negative for abdominal pain and blood in stool.  Genitourinary: Negative for dysuria and frequency.  Musculoskeletal: Positive for gait problem (Due to neuropathy). Negative for arthralgias and myalgias.  Neurological: Positive for numbness (Neuropathy in feet bilaterally). Negative for syncope.  Hematological: Negative for adenopathy. Bruises/bleeds easily.  All other systems reviewed and are negative.   BP 110/60 (BP Location: Right Arm, Patient Position: Sitting, Cuff Size: Large)   Pulse 79   Resp 16   Ht 6' (1.829 m)   Wt 270  lb (122.5 kg)   SpO2 96% Comment: ON RA  BMI 36.62 kg/m  Physical Exam  Constitutional: He is oriented to person, place, and time. He appears well-developed and well-nourished. No distress.  HENT:  Head: Normocephalic and atraumatic.  Mouth/Throat: No oropharyngeal exudate.  Eyes: Conjunctivae and EOM are normal. No scleral icterus.  Neck: Neck supple. No thyromegaly present.  Cardiovascular: Normal rate, regular rhythm and normal heart sounds.  Exam reveals no gallop and no friction rub.   No murmur heard. Pulmonary/Chest: Effort normal. He has no wheezes. He has rales (Bilaterally).  Abdominal: Soft. He exhibits no distension. There is no tenderness.  Musculoskeletal: He exhibits no edema.  Lymphadenopathy:    He has no cervical adenopathy.  Neurological: He is alert and oriented to   person, place, and time. No cranial nerve deficit.  Good strength all 4 extremities  Skin: Skin is warm and dry.  Vitals reviewed.    Diagnostic Tests: CT CHEST WITHOUT CONTRAST  TECHNIQUE: Multidetector CT imaging of the chest was performed following the standard protocol without intravenous contrast. High resolution imaging of the lungs, as well as inspiratory and expiratory imaging, was performed.  COMPARISON:  12/05/2008 high-resolution chest CT.  FINDINGS: Cardiovascular: Mild cardiomegaly, stable. No significant pericardial fluid/thickening. Left main, left anterior descending, left circumflex and right coronary atherosclerosis. Atherosclerotic nonaneurysmal thoracic aorta. Dilated main pulmonary artery (3.8 cm diameter, increased from 3.4 cm).  Mediastinum/Nodes: No discrete thyroid nodules. Unremarkable esophagus. No axillary adenopathy. Mildly enlarged 1.4 cm right lower paraesophageal node (series 4/ image 75) is stable since 12/05/2008. No additional pathologically enlarged mediastinal or gross hilar nodes on this noncontrast study.  Lungs/Pleura: No pneumothorax. No  pleural effusion. No acute consolidative airspace disease, lung masses or significant pulmonary nodules. There is patchy reticulation and ground-glass attenuation throughout both lungs predominantly involving the subpleural lungs with lesser involvement of the peribronchovascular lungs. There is associated mild traction bronchiectasis and mild architectural distortion, most prominent in the left upper lobe. No frank honeycombing. No basilar gradient. Findings have mildly progressed since 12/05/2008. There is moderate patchy air trapping in both lungs on the expiration sequence.  Upper abdomen: Unremarkable.  Musculoskeletal: No aggressive appearing focal osseous lesions. Partially visualized surgical hardware from ACDF in the lower cervical spine. Marked thoracic spondylosis.  IMPRESSION: 1. Fibrotic interstitial lung disease characterized by patchy subpleural and peribronchovascular reticulation and ground-glass attenuation, mild traction bronchiectasis and mild architectural distortion, with no clear basilar gradient and no frank honeycombing. Mild progression of disease since 12/05/2008 chest CT. Moderate patchy air trapping in both lungs. 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 Nonspecific interstitial pneumonia (NSIP) is a consideration. Findings are not consistent with usual interstitial pneumonia (UIP). 2. Stable mild mediastinal lymphadenopathy, consistent with benign reactive etiology. 3. Aortic atherosclerosis. Left main and 3 vessel coronary atherosclerosis. 4. Stable mild cardiomegaly. 5. Worsening main pulmonary artery dilation, suggesting worsening pulmonary arterial hypertension.   Electronically Signed   By: Jason A Poff M.D.   On: 10/18/2016 09:30  Pulmonary some testing 10/14/2016  FVC 3.00 (63%) FEV1 2.70 (78%) No significant change with bronchodilators DLCO 16.82 (46%)  Impression: Mr. Molter is a 75-year-old  lifelong nonsmoker who presents with cough and dyspnea. 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 He needs a surgical lung biopsy for diagnostic purposes to define the underlying condition.  I discussed the proposed procedure of left VATS for lung biopsy with Mr. and Mrs. Javid. They understand this is a diagnostic procedure and will not improve his symptoms or treat the underlying condition. I informed him of the general nature of the procedure including the need for general anesthesia, the incisions to be used, use of a drainage tube postoperatively, the expected hospital stay, and the overall recovery. I informed him of the indications, risks, benefits, and alternatives. They understand the risk include, but are not limited to death, MI, DVT, PE, bleeding, possible need for transfusion, infection, prolonged air leak, cardiac arrhythmias, as well as the possibility of other unforeseeable complications.  He accepts the risk and wish to proceed.  He has a loose tooth and needs to be pulled. That is being done on January 9. He wants to wait until after that before having his lung surgery.  We have set a date   for Wednesday, 12/21/2016.  Plan:  Left VATS lung biopsy on 12/21/2016  Steven C Hendrickson, MD [facility] [phone]  I also discussed with Mr. and Mrs. Karam the operating study in which Dr. Ramaswamy is participating. If he likes to do that it would involve bronchoscopy with transbronchial biopsies and bronchoalveolar lavage. I discussed the nature of that procedure with them as well.  Steven C. Hendrickson, MD [facility] [phone]  

## 2016-12-02 ENCOUNTER — Other Ambulatory Visit: Payer: Self-pay | Admitting: *Deleted

## 2016-12-02 DIAGNOSIS — J849 Interstitial pulmonary disease, unspecified: Secondary | ICD-10-CM

## 2016-12-08 ENCOUNTER — Telehealth: Payer: Self-pay | Admitting: *Deleted

## 2016-12-08 NOTE — Telephone Encounter (Signed)
Shane [name],   I spoke to Shane [name] (spouse) again.  She said she received a letter in the mail about lab results.  It looks like a letter was sent mid-December.  Wife requested call back, [phone], patient is [facility] and often doesn't answer the phone if he is on a job site.  Thanks! Erline Levine

## 2016-12-14 NOTE — Telephone Encounter (Signed)
Patient's wife is returning phone call.  °

## 2016-12-14 NOTE — Telephone Encounter (Signed)
Spoke with the pt's spouse and notified that we already discussed the ct results and nothing further needed

## 2016-12-14 NOTE — Telephone Encounter (Signed)
Patient's wife also request when contacting her at work to please have someone to locate her so she can receive the call.

## 2016-12-14 NOTE — Telephone Encounter (Signed)
Pt has been seen since letter was sent, MR reviewed these results (CT chest). Nothing further is needed.  LMTCB for pt's wife.

## 2016-12-19 ENCOUNTER — Other Ambulatory Visit: Payer: Self-pay | Admitting: *Deleted

## 2016-12-19 ENCOUNTER — Encounter (HOSPITAL_COMMUNITY): Payer: Self-pay | Admitting: Vascular Surgery

## 2016-12-19 ENCOUNTER — Ambulatory Visit (HOSPITAL_COMMUNITY)
Admission: RE | Admit: 2016-12-19 | Discharge: 2016-12-19 | Disposition: A | Discharge status: Home / Self Care | Payer: Medicare Other | Source: Ambulatory Visit | Attending: Thoracic Surgery (Cardiothoracic Vascular Surgery) | Admitting: Thoracic Surgery (Cardiothoracic Vascular Surgery)

## 2016-12-19 ENCOUNTER — Other Ambulatory Visit: Payer: Self-pay

## 2016-12-19 ENCOUNTER — Encounter (HOSPITAL_COMMUNITY): Payer: Self-pay | Admitting: Emergency Medicine

## 2016-12-19 ENCOUNTER — Encounter (HOSPITAL_COMMUNITY): Payer: Self-pay

## 2016-12-19 ENCOUNTER — Encounter (HOSPITAL_COMMUNITY)
Admission: RE | Admit: 2016-12-19 | Discharge: 2016-12-19 | Disposition: A | Discharge status: Home / Self Care | Payer: Medicare Other | Source: Ambulatory Visit | Attending: Internal Medicine | Admitting: Internal Medicine

## 2016-12-19 DIAGNOSIS — Z01812 Encounter for preprocedural laboratory examination: Secondary | ICD-10-CM | POA: Diagnosis not present

## 2016-12-19 DIAGNOSIS — Z7982 Long term (current) use of aspirin: Secondary | ICD-10-CM | POA: Diagnosis not present

## 2016-12-19 DIAGNOSIS — Z01818 Encounter for other preprocedural examination: Secondary | ICD-10-CM | POA: Insufficient documentation

## 2016-12-19 DIAGNOSIS — Z0183 Encounter for blood typing: Secondary | ICD-10-CM | POA: Diagnosis not present

## 2016-12-19 DIAGNOSIS — I129 Hypertensive chronic kidney disease with stage 1 through stage 4 chronic kidney disease, or unspecified chronic kidney disease: Secondary | ICD-10-CM | POA: Diagnosis not present

## 2016-12-19 DIAGNOSIS — E785 Hyperlipidemia, unspecified: Secondary | ICD-10-CM | POA: Insufficient documentation

## 2016-12-19 DIAGNOSIS — C3490 Malignant neoplasm of unspecified part of unspecified bronchus or lung: Secondary | ICD-10-CM | POA: Insufficient documentation

## 2016-12-19 DIAGNOSIS — R001 Bradycardia, unspecified: Secondary | ICD-10-CM | POA: Insufficient documentation

## 2016-12-19 DIAGNOSIS — N183 Chronic kidney disease, stage 3 (moderate): Secondary | ICD-10-CM | POA: Insufficient documentation

## 2016-12-19 DIAGNOSIS — J849 Interstitial pulmonary disease, unspecified: Secondary | ICD-10-CM

## 2016-12-19 DIAGNOSIS — Z79899 Other long term (current) drug therapy: Secondary | ICD-10-CM | POA: Insufficient documentation

## 2016-12-19 DIAGNOSIS — Z9641 Presence of insulin pump (external) (internal): Secondary | ICD-10-CM | POA: Diagnosis not present

## 2016-12-19 DIAGNOSIS — K219 Gastro-esophageal reflux disease without esophagitis: Secondary | ICD-10-CM | POA: Insufficient documentation

## 2016-12-19 DIAGNOSIS — I4510 Unspecified right bundle-branch block: Secondary | ICD-10-CM | POA: Insufficient documentation

## 2016-12-19 DIAGNOSIS — E1122 Type 2 diabetes mellitus with diabetic chronic kidney disease: Secondary | ICD-10-CM | POA: Insufficient documentation

## 2016-12-19 DIAGNOSIS — Z006 Encounter for examination for normal comparison and control in clinical research program: Secondary | ICD-10-CM

## 2016-12-19 DIAGNOSIS — G4733 Obstructive sleep apnea (adult) (pediatric): Secondary | ICD-10-CM | POA: Diagnosis not present

## 2016-12-19 HISTORY — DX: Other specified postprocedural states: R11.2

## 2016-12-19 HISTORY — DX: Dyspnea, unspecified: R06.00

## 2016-12-19 HISTORY — DX: Other specified postprocedural states: Z98.890

## 2016-12-19 HISTORY — DX: Malignant (primary) neoplasm, unspecified: C80.1

## 2016-12-19 HISTORY — DX: Nausea with vomiting, unspecified: R11.2

## 2016-12-19 LAB — URINALYSIS, ROUTINE W REFLEX MICROSCOPIC
Bilirubin Urine: NEGATIVE
Glucose, UA: >=500 mg/dL — AB
Hgb urine dipstick: NEGATIVE
Ketones, ur: NEGATIVE mg/dL
Leukocytes, UA: NEGATIVE
Nitrite: NEGATIVE
Protein, ur: 30 mg/dL — AB
Specific Gravity, Urine: 1.018 (ref 1.005–1.030)
Squamous Epithelial / HPF: NONE SEEN
pH: 5 (ref 5.0–8.0)

## 2016-12-19 LAB — CBC
HCT: 42.7 % (ref 39.0–52.0)
Hemoglobin: 14.7 g/dL (ref 13.0–17.0)
MCH: 31.1 pg (ref 26.0–34.0)
MCHC: 34.4 g/dL (ref 30.0–36.0)
MCV: 90.5 fL (ref 78.0–100.0)
Platelets: 254 10*3/uL (ref 150–400)
RBC: 4.72 MIL/uL (ref 4.22–5.81)
RDW: 13.4 % (ref 11.5–15.5)
WBC: 8.4 10*3/uL (ref 4.0–10.5)

## 2016-12-19 LAB — COMPREHENSIVE METABOLIC PANEL
ALT: 42 U/L (ref 17–63)
AST: 38 U/L (ref 15–41)
Albumin: 3.5 g/dL (ref 3.5–5.0)
Alkaline Phosphatase: 74 U/L (ref 38–126)
Anion gap: 13 (ref 5–15)
BUN: 28 mg/dL — ABNORMAL HIGH (ref 6–20)
CO2: 24 mmol/L (ref 22–32)
Calcium: 9.9 mg/dL (ref 8.9–10.3)
Chloride: 99 mmol/L — ABNORMAL LOW (ref 101–111)
Creatinine, Ser: 1.79 mg/dL — ABNORMAL HIGH (ref 0.61–1.24)
GFR calc Af Amer: 41 mL/min — ABNORMAL LOW (ref >60)
GFR calc non Af Amer: 35 mL/min — ABNORMAL LOW (ref >60)
Glucose, Bld: 69 mg/dL (ref 65–99)
Potassium: 5.4 mmol/L — ABNORMAL HIGH (ref 3.5–5.1)
Sodium: 136 mmol/L (ref 135–145)
Total Bilirubin: 0.4 mg/dL (ref 0.3–1.2)
Total Protein: 8 g/dL (ref 6.5–8.1)

## 2016-12-19 LAB — SURGICAL PCR SCREEN
MRSA, PCR: NEGATIVE
Staphylococcus aureus: NEGATIVE

## 2016-12-19 LAB — PROTIME-INR
INR: 1.06
Prothrombin Time: 13.8 seconds (ref 11.4–15.2)

## 2016-12-19 LAB — GLUCOSE, CAPILLARY: Glucose-Capillary: 85 mg/dL (ref 65–99)

## 2016-12-19 LAB — APTT: aPTT: 34 seconds (ref 24–36)

## 2016-12-19 LAB — TYPE AND SCREEN
ABO/RH(D): O POS
Antibody Screen: NEGATIVE

## 2016-12-19 NOTE — Progress Notes (Signed)
Pt is going to talk to Dr [name] to get insulin pump amt.  It is a disposable pump.

## 2016-12-19 NOTE — Progress Notes (Addendum)
Anesthesia Chart Review:  Pt is a 76 year old male scheduled for L chest VATS, lung biopsy, video bronchoscopy on 12/21/2016 with Modesto Charon, MD  - PCP is Burnard Bunting, MD.   PMH includes:  HTN, DM, hyperlipidemia, CKD (stage 3), OSA, lung cancer, post-op N/V, GERD. Never smoker. BMI 37  Medications include: ASA, dapagliflozin, lasix, humalog, V-go insulin pump, potassium, lisinopril, nebivolol, protonix, albuterol, maxzide.   Preoperative labs reviewed.   - glucose 69. HgbA1c pending.  - Cr 1.79, BUN 28. Cr consistent with prior results from PCP's office (was 1.9 09/2016) - ABGs will be done DOS.   CXR 12/19/16:  - No acute findings.  No evidence of pneumonia. - Coarse interstitial markings throughout both lungs, similar to findings on recent chest CT, compatible with that CT report description of probable chronic hypersensitivity pneumonitis versus nonspecific interstitial pneumonia, at least mildly progressed compared to an earlier chest x-ray of 09/27/2011.  EKG 12/19/16: Sinus bradycardia (58 bpm) with 1st degree A-V block. RBBB.   Right heart cath 11/09/16: Hemodynamic findings consistent with mild pulmonary hypertension.  Minimally elevated pulmonary artery pressures. PA systolic pressure 36 mmHg.  Normal pulmonary capillary wedge pressure.  Cardiac output 4.6 L/m.  Nuclear stress test 10/25/16:   The left ventricular ejection fraction is normal (55-65%).  Nuclear stress EF: 60%.  There was no ST segment deviation noted during stress.  The study is normal.  This is a low risk study.  Echo 10/05/16:  - Left ventricle: The cavity size was normal. Wall thickness was increased in a pattern of moderate LVH. Systolic function was normal. The estimated ejection fraction was in the range of 55% to 65%. Wall motion was normal; there were no regional wall motion abnormalities. Doppler parameters are consistent with abnormal left ventricular relaxation (grade 1 diastolic  dysfunction). - Aortic valve: Mildly to moderately calcified annulus. Moderately thickened, moderately calcified leaflets.   Pt's case has been moved back to the afternoon.  Pt is anxious about being NPO for so long given DM and V-Go insulin pump.  Dr. Roxan Hockey has ok'd pt to have clear liquids until 8AM DOS.   If no changes, I anticipate pt can proceed with surgery as scheduled.   Willeen Cass, FNP-BC [facility]/Anesthesiology Phone: [phone] 12/20/2016 1:49 PM

## 2016-12-19 NOTE — Pre-Procedure Instructions (Signed)
CIRILO CANNER  12/19/2016      [id number] [place] [place] [facility] [id number] [id number]: [phone]: [phone]   Your procedure is scheduled on 12/21/16.  [facility]  Call this number if you have problems the morning of surgery:  [phone]   Remember:  Do not eat food or drink liquids after midnight.  Take these medicines the morning of surgery with A SIP OF WATER --tylenol,valium,bystolic,protonix   Do not wear jewelry, make-up or nail polish.  Do not wear lotions, powders, or perfumes, or deoderant.  Do not shave 48 hours prior to surgery.  Men may shave face and neck.  Do not bring valuables to the hospital.  [facility] any belongings or valuables.  Contacts, dentures or bridgework may not be worn into surgery.  Leave your suitcase in the car.  After surgery it may be brought to your room.  For patients admitted to the hospital, discharge time will be determined by your treatment team.  Patients discharged the day of surgery will not be allowed to drive home.   Name and phone number of your driver:    Special instructions:    Please read over the following fact sheets that you were given. MRSA Information    How to Manage Your Diabetes Before and After Surgery  Why is it important to control my blood sugar before and after surgery? . Improving blood sugar levels before and after surgery helps healing and can limit problems. . A way of improving blood sugar control is eating a healthy diet by: o  Eating less sugar and carbohydrates o  Increasing activity/exercise o  Talking with your doctor about reaching your blood sugar goals . High blood sugars (greater than 180 mg/dL) can raise your risk of infections and slow your recovery, so you will need to focus on controlling your diabetes during the  weeks before surgery. . Make sure that the doctor who takes care of your diabetes knows about your planned surgery including the date and location.  How do I manage my blood sugar before surgery? . Check your blood sugar at least 4 times a day, starting 2 days before surgery, to make sure that the level is not too high or low. o Check your blood sugar the morning of your surgery when you wake up and every 2 hours until you get to the Short Stay unit. . If your blood sugar is less than 70 mg/dL, you will need to treat for low blood sugar: o Do not take insulin. o Treat a low blood sugar (less than 70 mg/dL) with  cup of clear juice (cranberry or apple), 4 glucose tablets, OR glucose gel. o Recheck blood sugar in 15 minutes after treatment (to make sure it is greater than 70 mg/dL). If your blood sugar is not greater than 70 mg/dL on recheck, call [phone] further instructions. . Report your blood sugar to the short stay nurse when you get to Short Stay.  . If you are admitted to the hospital after surgery: o Your blood sugar will be checked by the staff and you will probably be given insulin after surgery (instead of oral diabetes medicines) to make sure you have good blood sugar levels. o The goal for blood sugar control after surgery is 80-180 mg/dL.  WHAT DO I DO ABOUT MY DIABETES MEDICATION?   Marland Kitchen Do not take oral diabetes medicines (pills) the morning of surgery.  . THE NIGHT BEFORE SURGERY,      . HE MORNING OF SURGERy  . The day of surgery, do not take other diabetes injectables, including Byetta (exenatide), Bydureon (exenatide ER), Victoza (liraglutide), or Trulicity (dulaglutide).  . If your CBG is greater than 220 mg/dL, you may take  of your sliding scale (correction) dose of insulin.  Other Instructions:          Patient Signature:  Date:   Nurse Signature:  Date:   Reviewed and Endorsed by [facility] Patient Education Committee,  August 2015

## 2016-12-19 NOTE — Progress Notes (Signed)
Clinical Research Coordinator / Research RN note : This visit for Subject Shane [name] with Dec 24, 1940 on 12/19/2016 for the above protocol and is for purpose of consenting. The consent for this encounter is under Protocol Version Amendment 1 and is currently IRB approved. Subject expressed  interest and provided informed consent in participating as a study subject. Subject thanked for participation.   See paper informed consent process documentation, maintained in subject specific [facility] records for full documentation of consent.  Signed by Doreatha Martin, RN Research Nurse Office [phone] 2:50 PM 12/19/2016

## 2016-12-20 ENCOUNTER — Ambulatory Visit: Payer: Medicare Other | Admitting: Internal Medicine

## 2016-12-20 LAB — HEMOGLOBIN A1C
Hgb A1c MFr Bld: 7.9 % — ABNORMAL HIGH (ref 4.8–5.6)
Mean Plasma Glucose: 180 mg/dL

## 2016-12-21 ENCOUNTER — Inpatient Hospital Stay (HOSPITAL_COMMUNITY)
Admission: RE | Admit: 2016-12-21 | Payer: Medicare Other | Source: Ambulatory Visit | Admitting: Thoracic Surgery (Cardiothoracic Vascular Surgery)

## 2016-12-21 ENCOUNTER — Other Ambulatory Visit: Payer: Self-pay | Admitting: *Deleted

## 2016-12-21 ENCOUNTER — Encounter (HOSPITAL_COMMUNITY): Admission: RE | Payer: Self-pay | Source: Ambulatory Visit

## 2016-12-21 SURGERY — VIDEO ASSISTED THORACOSCOPY
Anesthesia: General | Site: Chest

## 2016-12-22 ENCOUNTER — Other Ambulatory Visit: Payer: Self-pay | Admitting: *Deleted

## 2016-12-22 DIAGNOSIS — J849 Interstitial pulmonary disease, unspecified: Secondary | ICD-10-CM

## 2016-12-22 LAB — BLOOD GAS, ARTERIAL

## 2016-12-25 NOTE — Anesthesia Preprocedure Evaluation (Addendum)
Anesthesia Evaluation  Patient identified by MRN, date of birth, ID band Patient awake    Reviewed: Allergy & Precautions, NPO status , Patient's Chart, lab work & pertinent test results, reviewed documented beta blocker date and time   History of Anesthesia Complications (+) PONV  Airway Mallampati: IV  TM Distance: <3 FB Neck ROM: Full    Dental  (+) Dental Advisory Given, Chipped   Pulmonary sleep apnea and Continuous Positive Airway Pressure Ventilation ,    breath sounds clear to auscultation       Cardiovascular hypertension, Pt. on medications and Pt. on home beta blockers (-) angina Rhythm:Regular Rate:Normal  '17 ECHO: EF 55-65%, valves OK 12/17 stress test: EF 60%, no ischemia   Neuro/Psych negative neurological ROS     GI/Hepatic Neg liver ROS, GERD  Medicated and Controlled,  Endo/Other  diabetes (glu 253), Insulin DependentMorbid obesity  Renal/GU Renal InsufficiencyRenal disease (creat 1.79)     Musculoskeletal   Abdominal (+) + obese,   Peds  Hematology   Anesthesia Other Findings   Reproductive/Obstetrics                            Anesthesia Physical Anesthesia Plan  ASA: III  Anesthesia Plan: General   Post-op Pain Management:    Induction: Intravenous  Airway Management Planned: Oral ETT, Video Laryngoscope Planned and Double Lumen EBT  Additional Equipment: Arterial line, CVP and Ultrasound Guidance Line Placement  Intra-op Plan:   Post-operative Plan:   Informed Consent: I have reviewed the patients History and Physical, chart, labs and discussed the procedure including the risks, benefits and alternatives for the proposed anesthesia with the patient or authorized representative who has indicated his/her understanding and acceptance.   Dental advisory given  Plan Discussed with: CRNA and Surgeon  Anesthesia Plan Comments: (Plan routine monitors, A line,  Central line, GETA with VideoGlide intubation, DLT)        Anesthesia Quick Evaluation

## 2016-12-26 ENCOUNTER — Encounter (HOSPITAL_COMMUNITY)
Admission: RE | Disposition: A | Discharge status: Home / Self Care | Payer: Self-pay | Source: Ambulatory Visit | Attending: Thoracic Surgery (Cardiothoracic Vascular Surgery)

## 2016-12-26 ENCOUNTER — Inpatient Hospital Stay (HOSPITAL_COMMUNITY): Payer: Medicare Other

## 2016-12-26 ENCOUNTER — Inpatient Hospital Stay (HOSPITAL_COMMUNITY): Payer: Medicare Other | Admitting: Anesthesiology

## 2016-12-26 ENCOUNTER — Encounter (HOSPITAL_COMMUNITY): Payer: Self-pay | Admitting: Certified Registered"

## 2016-12-26 ENCOUNTER — Inpatient Hospital Stay (HOSPITAL_COMMUNITY)
Admission: RE | Admit: 2016-12-26 | Discharge: 2016-12-29 | DRG: 168 | Disposition: A | Discharge status: Home / Self Care | Payer: Medicare Other | Source: Ambulatory Visit | Attending: Thoracic Surgery (Cardiothoracic Vascular Surgery) | Admitting: Thoracic Surgery (Cardiothoracic Vascular Surgery)

## 2016-12-26 DIAGNOSIS — I129 Hypertensive chronic kidney disease with stage 1 through stage 4 chronic kidney disease, or unspecified chronic kidney disease: Secondary | ICD-10-CM | POA: Diagnosis present

## 2016-12-26 DIAGNOSIS — E1122 Type 2 diabetes mellitus with diabetic chronic kidney disease: Secondary | ICD-10-CM | POA: Diagnosis present

## 2016-12-26 DIAGNOSIS — Z794 Long term (current) use of insulin: Secondary | ICD-10-CM

## 2016-12-26 DIAGNOSIS — Z9841 Cataract extraction status, right eye: Secondary | ICD-10-CM

## 2016-12-26 DIAGNOSIS — I441 Atrioventricular block, second degree: Secondary | ICD-10-CM | POA: Diagnosis present

## 2016-12-26 DIAGNOSIS — G4733 Obstructive sleep apnea (adult) (pediatric): Secondary | ICD-10-CM | POA: Diagnosis present

## 2016-12-26 DIAGNOSIS — E785 Hyperlipidemia, unspecified: Secondary | ICD-10-CM | POA: Diagnosis present

## 2016-12-26 DIAGNOSIS — Z9689 Presence of other specified functional implants: Secondary | ICD-10-CM

## 2016-12-26 DIAGNOSIS — Z9842 Cataract extraction status, left eye: Secondary | ICD-10-CM | POA: Diagnosis not present

## 2016-12-26 DIAGNOSIS — Z79899 Other long term (current) drug therapy: Secondary | ICD-10-CM | POA: Diagnosis not present

## 2016-12-26 DIAGNOSIS — Z419 Encounter for procedure for purposes other than remedying health state, unspecified: Secondary | ICD-10-CM

## 2016-12-26 DIAGNOSIS — J849 Interstitial pulmonary disease, unspecified: Secondary | ICD-10-CM | POA: Diagnosis not present

## 2016-12-26 DIAGNOSIS — Z4682 Encounter for fitting and adjustment of non-vascular catheter: Secondary | ICD-10-CM

## 2016-12-26 DIAGNOSIS — R0600 Dyspnea, unspecified: Secondary | ICD-10-CM | POA: Diagnosis present

## 2016-12-26 DIAGNOSIS — N183 Chronic kidney disease, stage 3 (moderate): Secondary | ICD-10-CM | POA: Diagnosis present

## 2016-12-26 DIAGNOSIS — E1142 Type 2 diabetes mellitus with diabetic polyneuropathy: Secondary | ICD-10-CM | POA: Diagnosis present

## 2016-12-26 DIAGNOSIS — Z981 Arthrodesis status: Secondary | ICD-10-CM | POA: Diagnosis not present

## 2016-12-26 DIAGNOSIS — K219 Gastro-esophageal reflux disease without esophagitis: Secondary | ICD-10-CM | POA: Diagnosis present

## 2016-12-26 DIAGNOSIS — Z961 Presence of intraocular lens: Secondary | ICD-10-CM | POA: Diagnosis present

## 2016-12-26 DIAGNOSIS — Z7982 Long term (current) use of aspirin: Secondary | ICD-10-CM

## 2016-12-26 DIAGNOSIS — Z09 Encounter for follow-up examination after completed treatment for conditions other than malignant neoplasm: Secondary | ICD-10-CM

## 2016-12-26 HISTORY — PX: VIDEO BRONCHOSCOPY: SHX5072

## 2016-12-26 HISTORY — PX: LUNG BIOPSY: SHX5088

## 2016-12-26 HISTORY — PX: VIDEO ASSISTED THORACOSCOPY: SHX5073

## 2016-12-26 LAB — GLUCOSE, CAPILLARY
Glucose-Capillary: 136 mg/dL — ABNORMAL HIGH (ref 65–99)
Glucose-Capillary: 138 mg/dL — ABNORMAL HIGH (ref 65–99)
Glucose-Capillary: 154 mg/dL — ABNORMAL HIGH (ref 65–99)
Glucose-Capillary: 158 mg/dL — ABNORMAL HIGH (ref 65–99)
Glucose-Capillary: 162 mg/dL — ABNORMAL HIGH (ref 65–99)
Glucose-Capillary: 163 mg/dL — ABNORMAL HIGH (ref 65–99)
Glucose-Capillary: 176 mg/dL — ABNORMAL HIGH (ref 65–99)
Glucose-Capillary: 186 mg/dL — ABNORMAL HIGH (ref 65–99)
Glucose-Capillary: 187 mg/dL — ABNORMAL HIGH (ref 65–99)
Glucose-Capillary: 191 mg/dL — ABNORMAL HIGH (ref 65–99)
Glucose-Capillary: 195 mg/dL — ABNORMAL HIGH (ref 65–99)
Glucose-Capillary: 203 mg/dL — ABNORMAL HIGH (ref 65–99)
Glucose-Capillary: 205 mg/dL — ABNORMAL HIGH (ref 65–99)
Glucose-Capillary: 218 mg/dL — ABNORMAL HIGH (ref 65–99)
Glucose-Capillary: 259 mg/dL — ABNORMAL HIGH (ref 65–99)

## 2016-12-26 LAB — BLOOD GAS, ARTERIAL
Acid-Base Excess: 0.6 mmol/L (ref 0.0–2.0)
Bicarbonate: 25.2 mmol/L (ref 20.0–28.0)
Drawn by: 44984
FIO2: 21
O2 Saturation: 93.9 %
Patient temperature: 98.6
pCO2 arterial: 44 mmHg (ref 32.0–48.0)
pH, Arterial: 7.376 (ref 7.350–7.450)
pO2, Arterial: 88.1 mmHg (ref 83.0–108.0)

## 2016-12-26 SURGERY — BRONCHOSCOPY, VIDEO-ASSISTED
Anesthesia: General | Site: Chest

## 2016-12-26 MED ORDER — ACETAMINOPHEN 500 MG PO TABS
1000.0000 mg | ORAL_TABLET | Freq: Four times a day (QID) | ORAL | Status: DC
  Administered 2016-12-27 – 2016-12-28 (×7): 1000 mg via ORAL
  Filled 2016-12-26 (×8): qty 2

## 2016-12-26 MED ORDER — MEPERIDINE HCL 25 MG/ML IJ SOLN: 6.2500 mg | INTRAMUSCULAR | Status: DC | PRN

## 2016-12-26 MED ORDER — LACTATED RINGERS IV SOLN
INTRAVENOUS | Status: DC | PRN
  Administered 2016-12-26 (×3): via INTRAVENOUS

## 2016-12-26 MED ORDER — ASPIRIN EC 81 MG PO TBEC
81.0000 mg | DELAYED_RELEASE_TABLET | Freq: Every day | ORAL | Status: DC
  Administered 2016-12-27 – 2016-12-28 (×2): 81 mg via ORAL
  Filled 2016-12-26 (×2): qty 1

## 2016-12-26 MED ORDER — ROCURONIUM BROMIDE 50 MG/5ML IV SOSY
PREFILLED_SYRINGE | INTRAVENOUS | Status: AC
  Filled 2016-12-26: qty 5

## 2016-12-26 MED ORDER — FENTANYL CITRATE (PF) 100 MCG/2ML IJ SOLN
INTRAMUSCULAR | Status: AC
  Filled 2016-12-26: qty 2

## 2016-12-26 MED ORDER — ESMOLOL HCL 100 MG/10ML IV SOLN
INTRAVENOUS | Status: DC | PRN
  Administered 2016-12-26: 10 mg via INTRAVENOUS

## 2016-12-26 MED ORDER — SODIUM CHLORIDE 0.9 % IV SOLN
INTRAVENOUS | Status: DC
  Filled 2016-12-26: qty 2.5

## 2016-12-26 MED ORDER — EPHEDRINE SULFATE-NACL 50-0.9 MG/10ML-% IV SOSY
PREFILLED_SYRINGE | INTRAVENOUS | Status: DC | PRN
  Administered 2016-12-26: 10 mg via INTRAVENOUS

## 2016-12-26 MED ORDER — PANTOPRAZOLE SODIUM 40 MG PO TBEC
40.0000 mg | DELAYED_RELEASE_TABLET | Freq: Every day | ORAL | Status: DC
  Administered 2016-12-27 – 2016-12-28 (×2): 40 mg via ORAL
  Filled 2016-12-26 (×2): qty 1

## 2016-12-26 MED ORDER — TRAMADOL HCL 50 MG PO TABS: 50.0000 mg | ORAL_TABLET | Freq: Four times a day (QID) | ORAL | Status: DC | PRN

## 2016-12-26 MED ORDER — BISACODYL 5 MG PO TBEC
10.0000 mg | DELAYED_RELEASE_TABLET | Freq: Every day | ORAL | Status: DC
  Administered 2016-12-26 – 2016-12-28 (×3): 10 mg via ORAL
  Filled 2016-12-26 (×3): qty 2

## 2016-12-26 MED ORDER — SUCCINYLCHOLINE CHLORIDE 200 MG/10ML IV SOSY
PREFILLED_SYRINGE | INTRAVENOUS | Status: AC
  Filled 2016-12-26: qty 10

## 2016-12-26 MED ORDER — DIAZEPAM 5 MG PO TABS: 5.0000 mg | ORAL_TABLET | Freq: Every evening | ORAL | Status: DC | PRN

## 2016-12-26 MED ORDER — PROPOFOL 10 MG/ML IV BOLUS
INTRAVENOUS | Status: DC | PRN
Start: 2016-12-26 — End: 2016-12-26
  Administered 2016-12-26: 100 mg via INTRAVENOUS

## 2016-12-26 MED ORDER — ONDANSETRON HCL 4 MG/2ML IJ SOLN
4.0000 mg | Freq: Four times a day (QID) | INTRAMUSCULAR | Status: DC | PRN
  Administered 2016-12-26: 4 mg via INTRAVENOUS
  Filled 2016-12-26: qty 2

## 2016-12-26 MED ORDER — INSULIN REGULAR BOLUS VIA INFUSION
0.0000 [IU] | Freq: Three times a day (TID) | INTRAVENOUS | Status: DC
  Administered 2016-12-27: 7 [IU] via INTRAVENOUS
  Administered 2016-12-27: 5.3 [IU] via INTRAVENOUS
  Administered 2016-12-27: 2.6 [IU] via INTRAVENOUS
  Administered 2016-12-28: 3.3 [IU] via INTRAVENOUS
  Filled 2016-12-26: qty 10

## 2016-12-26 MED ORDER — ONDANSETRON HCL 4 MG/2ML IJ SOLN
INTRAMUSCULAR | Status: DC | PRN
  Administered 2016-12-26: 4 mg via INTRAVENOUS

## 2016-12-26 MED ORDER — DEXTROSE-NACL 5-0.45 % IV SOLN
INTRAVENOUS | Status: DC
  Administered 2016-12-26 (×2): via INTRAVENOUS

## 2016-12-26 MED ORDER — PROPOFOL 10 MG/ML IV BOLUS
INTRAVENOUS | Status: AC
  Filled 2016-12-26: qty 20

## 2016-12-26 MED ORDER — HYDROMORPHONE HCL 1 MG/ML IJ SOLN
0.2500 mg | INTRAMUSCULAR | Status: DC | PRN
  Administered 2016-12-26 (×2): 0.5 mg via INTRAVENOUS

## 2016-12-26 MED ORDER — ALBUTEROL SULFATE (2.5 MG/3ML) 0.083% IN NEBU
3.0000 mL | INHALATION_SOLUTION | RESPIRATORY_TRACT | Status: DC | PRN
  Administered 2016-12-28: 3 mL via RESPIRATORY_TRACT

## 2016-12-26 MED ORDER — ONDANSETRON HCL 4 MG/2ML IJ SOLN
4.0000 mg | Freq: Four times a day (QID) | INTRAMUSCULAR | Status: DC | PRN
  Filled 2016-12-26: qty 2

## 2016-12-26 MED ORDER — HYDROMORPHONE HCL 1 MG/ML IJ SOLN
INTRAMUSCULAR | Status: AC
  Administered 2016-12-26: 0.5 mg via INTRAVENOUS
  Filled 2016-12-26: qty 1

## 2016-12-26 MED ORDER — PHENYLEPHRINE 40 MCG/ML (10ML) SYRINGE FOR IV PUSH (FOR BLOOD PRESSURE SUPPORT)
PREFILLED_SYRINGE | INTRAVENOUS | Status: AC
  Filled 2016-12-26: qty 10

## 2016-12-26 MED ORDER — LIDOCAINE 2% (20 MG/ML) 5 ML SYRINGE
INTRAMUSCULAR | Status: AC
  Filled 2016-12-26: qty 5

## 2016-12-26 MED ORDER — NALOXONE HCL 0.4 MG/ML IJ SOLN: 0.4000 mg | INTRAMUSCULAR | Status: DC | PRN

## 2016-12-26 MED ORDER — FENTANYL CITRATE (PF) 100 MCG/2ML IJ SOLN
INTRAMUSCULAR | Status: AC
  Filled 2016-12-26: qty 4

## 2016-12-26 MED ORDER — CEFAZOLIN IN D5W 1 GM/50ML IV SOLN
INTRAVENOUS | Status: DC | PRN
  Administered 2016-12-26: 2 g via INTRAVENOUS

## 2016-12-26 MED ORDER — DEXTROSE 5 % IV SOLN
1.5000 g | Freq: Two times a day (BID) | INTRAVENOUS | Status: AC
  Administered 2016-12-26 – 2016-12-27 (×2): 1.5 g via INTRAVENOUS
  Filled 2016-12-26 (×3): qty 1.5

## 2016-12-26 MED ORDER — ACETAMINOPHEN 160 MG/5ML PO SOLN
1000.0000 mg | Freq: Four times a day (QID) | ORAL | Status: DC
  Administered 2016-12-26: 1000 mg via ORAL
  Filled 2016-12-26 (×2): qty 40.6

## 2016-12-26 MED ORDER — EPINEPHRINE PF 1 MG/ML IJ SOLN
INTRAMUSCULAR | Status: AC
  Filled 2016-12-26: qty 1

## 2016-12-26 MED ORDER — DICLOFENAC SODIUM 75 MG PO TBEC
75.0000 mg | DELAYED_RELEASE_TABLET | Freq: Two times a day (BID) | ORAL | Status: DC
  Administered 2016-12-27 – 2016-12-28 (×4): 75 mg via ORAL
  Filled 2016-12-26 (×8): qty 1

## 2016-12-26 MED ORDER — LACTATED RINGERS IV SOLN
INTRAVENOUS | Status: DC | PRN
  Administered 2016-12-26: 07:00:00 via INTRAVENOUS

## 2016-12-26 MED ORDER — SUGAMMADEX SODIUM 500 MG/5ML IV SOLN
INTRAVENOUS | Status: DC | PRN
  Administered 2016-12-26: 248 mg via INTRAVENOUS

## 2016-12-26 MED ORDER — ROCURONIUM BROMIDE 100 MG/10ML IV SOLN
INTRAVENOUS | Status: DC | PRN
  Administered 2016-12-26: 30 mg via INTRAVENOUS
  Administered 2016-12-26: 20 mg via INTRAVENOUS
  Administered 2016-12-26: 50 mg via INTRAVENOUS

## 2016-12-26 MED ORDER — SENNOSIDES-DOCUSATE SODIUM 8.6-50 MG PO TABS
1.0000 | ORAL_TABLET | Freq: Every day | ORAL | Status: DC
  Administered 2016-12-27: 1 via ORAL
  Filled 2016-12-26 (×2): qty 1

## 2016-12-26 MED ORDER — LISINOPRIL 20 MG PO TABS
20.0000 mg | ORAL_TABLET | Freq: Every day | ORAL | Status: DC
  Administered 2016-12-27 – 2016-12-28 (×2): 20 mg via ORAL
  Filled 2016-12-26 (×2): qty 1

## 2016-12-26 MED ORDER — BUPIVACAINE HCL (PF) 0.5 % IJ SOLN
INTRAMUSCULAR | Status: DC | PRN
  Administered 2016-12-26: 30 mL

## 2016-12-26 MED ORDER — FENTANYL CITRATE (PF) 100 MCG/2ML IJ SOLN
INTRAMUSCULAR | Status: DC | PRN
  Administered 2016-12-26: 100 ug via INTRAVENOUS
  Administered 2016-12-26: 50 ug via INTRAVENOUS
  Administered 2016-12-26: 100 ug via INTRAVENOUS
  Administered 2016-12-26 (×3): 50 ug via INTRAVENOUS

## 2016-12-26 MED ORDER — BUPIVACAINE ON-Q PAIN PUMP (FOR ORDER SET NO CHG)
INJECTION | Status: AC
  Filled 2016-12-26: qty 1

## 2016-12-26 MED ORDER — ALBUTEROL SULFATE (2.5 MG/3ML) 0.083% IN NEBU
2.5000 mg | INHALATION_SOLUTION | RESPIRATORY_TRACT | Status: DC
  Administered 2016-12-26 – 2016-12-27 (×5): 2.5 mg via RESPIRATORY_TRACT
  Filled 2016-12-26 (×5): qty 3

## 2016-12-26 MED ORDER — FENTANYL 40 MCG/ML IV SOLN
INTRAVENOUS | Status: AC
  Filled 2016-12-26: qty 25

## 2016-12-26 MED ORDER — MIDAZOLAM HCL 2 MG/2ML IJ SOLN: 0.5000 mg | Freq: Once | INTRAMUSCULAR | Status: DC | PRN

## 2016-12-26 MED ORDER — SODIUM CHLORIDE 0.9 % IV SOLN
30.0000 meq | Freq: Every day | INTRAVENOUS | Status: DC | PRN
  Filled 2016-12-26: qty 15

## 2016-12-26 MED ORDER — ESMOLOL HCL 100 MG/10ML IV SOLN
INTRAVENOUS | Status: AC
  Filled 2016-12-26: qty 10

## 2016-12-26 MED ORDER — MIDAZOLAM HCL 5 MG/5ML IJ SOLN
INTRAMUSCULAR | Status: DC | PRN
  Administered 2016-12-26 (×2): 1 mg via INTRAVENOUS

## 2016-12-26 MED ORDER — SODIUM CHLORIDE 0.9% FLUSH: 9.0000 mL | INTRAVENOUS | Status: DC | PRN

## 2016-12-26 MED ORDER — POTASSIUM CHLORIDE CRYS ER 20 MEQ PO TBCR
20.0000 meq | EXTENDED_RELEASE_TABLET | Freq: Every day | ORAL | Status: DC
  Administered 2016-12-27 – 2016-12-28 (×2): 20 meq via ORAL
  Filled 2016-12-26 (×2): qty 1

## 2016-12-26 MED ORDER — SODIUM CHLORIDE 0.9 % IV SOLN
INTRAVENOUS | Status: DC
  Administered 2016-12-26: 2.9 [IU]/h via INTRAVENOUS
  Filled 2016-12-26: qty 2.5

## 2016-12-26 MED ORDER — 0.9 % SODIUM CHLORIDE (POUR BTL) OPTIME
TOPICAL | Status: DC | PRN
  Administered 2016-12-26: 1000 mL

## 2016-12-26 MED ORDER — NEBIVOLOL HCL 5 MG PO TABS: 5.0000 mg | ORAL_TABLET | Freq: Every day | ORAL | Status: DC

## 2016-12-26 MED ORDER — SODIUM CHLORIDE 0.9 % IV SOLN: INTRAVENOUS | Status: DC

## 2016-12-26 MED ORDER — FENTANYL 40 MCG/ML IV SOLN
INTRAVENOUS | Status: DC
  Administered 2016-12-26: 70 ug via INTRAVENOUS
  Administered 2016-12-26: 11:00:00 via INTRAVENOUS
  Administered 2016-12-27: 70 ug via INTRAVENOUS
  Administered 2016-12-27: 40 ug via INTRAVENOUS
  Administered 2016-12-27: 0 ug via INTRAVENOUS
  Administered 2016-12-27: 10 ug via INTRAVENOUS
  Administered 2016-12-27: 20 ug via INTRAVENOUS
  Administered 2016-12-27: 0 ug via INTRAVENOUS
  Administered 2016-12-28 (×2): 10 ug via INTRAVENOUS
  Administered 2016-12-28: 20 ug via INTRAVENOUS
  Administered 2016-12-29: 0 ug via INTRAVENOUS
  Administered 2016-12-29: 10 ug via INTRAVENOUS

## 2016-12-26 MED ORDER — BUPIVACAINE HCL (PF) 0.5 % IJ SOLN
INTRAMUSCULAR | Status: AC
  Filled 2016-12-26: qty 30

## 2016-12-26 MED ORDER — DIPHENHYDRAMINE HCL 50 MG/ML IJ SOLN: 12.5000 mg | Freq: Four times a day (QID) | INTRAMUSCULAR | Status: DC | PRN

## 2016-12-26 MED ORDER — BUPIVACAINE 0.5 % ON-Q PUMP SINGLE CATH 400 ML
400.0000 mL | INJECTION | Status: AC
  Administered 2016-12-26: 400 mL
  Filled 2016-12-26: qty 400

## 2016-12-26 MED ORDER — CEFAZOLIN SODIUM 1 G IJ SOLR
INTRAMUSCULAR | Status: AC
  Filled 2016-12-26: qty 20

## 2016-12-26 MED ORDER — CEFUROXIME SODIUM 1.5 G IJ SOLR
1.5000 g | INTRAMUSCULAR | Status: DC
  Filled 2016-12-26: qty 1.5

## 2016-12-26 MED ORDER — DIPHENHYDRAMINE HCL 12.5 MG/5ML PO ELIX: 12.5000 mg | ORAL_SOLUTION | Freq: Four times a day (QID) | ORAL | Status: DC | PRN

## 2016-12-26 MED ORDER — PHENYLEPHRINE 40 MCG/ML (10ML) SYRINGE FOR IV PUSH (FOR BLOOD PRESSURE SUPPORT)
PREFILLED_SYRINGE | INTRAVENOUS | Status: DC | PRN
  Administered 2016-12-26 (×3): 80 ug via INTRAVENOUS

## 2016-12-26 MED ORDER — PROMETHAZINE HCL 25 MG/ML IJ SOLN: 6.2500 mg | INTRAMUSCULAR | Status: DC | PRN

## 2016-12-26 MED ORDER — TRIAMTERENE-HCTZ 37.5-25 MG PO TABS
1.0000 | ORAL_TABLET | Freq: Every day | ORAL | Status: DC
  Administered 2016-12-27 – 2016-12-28 (×2): 1 via ORAL
  Filled 2016-12-26 (×3): qty 1

## 2016-12-26 MED ORDER — MIDAZOLAM HCL 2 MG/2ML IJ SOLN
INTRAMUSCULAR | Status: AC
  Filled 2016-12-26: qty 2

## 2016-12-26 MED ORDER — DEXTROSE 50 % IV SOLN: 25.0000 mL | INTRAVENOUS | Status: DC | PRN

## 2016-12-26 SURGICAL SUPPLY — 103 items
ADH SKN CLS APL DERMABOND .7 (GAUZE/BANDAGES/DRESSINGS) ×3
APPLIER CLIP ROT 10 11.4 M/L (STAPLE)
APR CLP MED LRG 11.4X10 (STAPLE)
BAG SPEC RTRVL LRG 6X4 10 (ENDOMECHANICALS)
BALL CTTN LRG ABS STRL LF (GAUZE/BANDAGES/DRESSINGS)
BLOCK BITE 60FR ADLT L/F BLUE (MISCELLANEOUS) ×4 IMPLANT
BRUSH CYTOL CELLEBRITY 1.5X140 (MISCELLANEOUS) IMPLANT
CANISTER SUCTION 2500CC (MISCELLANEOUS) ×4 IMPLANT
CATH KIT ON Q 5IN SLV (PAIN MANAGEMENT) IMPLANT
CATH KIT ON-Q SILVERSOAK 5 (CATHETERS) IMPLANT
CATH KIT ON-Q SILVERSOAK 5IN (CATHETERS) ×4 IMPLANT
CATH THORACIC 28FR (CATHETERS) IMPLANT
CATH THORACIC 28FR RT ANG (CATHETERS) IMPLANT
CATH THORACIC 36FR (CATHETERS) IMPLANT
CATH THORACIC 36FR RT ANG (CATHETERS) IMPLANT
CLIP APPLIE ROT 10 11.4 M/L (STAPLE) IMPLANT
CLIP TI MEDIUM 6 (CLIP) IMPLANT
CLSR STERI-STRIP ANTIMIC 1/2X4 (GAUZE/BANDAGES/DRESSINGS) ×1 IMPLANT
CONN Y 3/8X3/8X3/8  BEN (MISCELLANEOUS) ×1
CONN Y 3/8X3/8X3/8 BEN (MISCELLANEOUS) ×3 IMPLANT
CONT SPEC 4OZ CLIKSEAL STRL BL (MISCELLANEOUS) ×23 IMPLANT
COTTONBALL LRG STERILE PKG (GAUZE/BANDAGES/DRESSINGS) IMPLANT
COVER SURGICAL LIGHT HANDLE (MISCELLANEOUS) ×4 IMPLANT
COVER TABLE BACK 60X90 (DRAPES) ×4 IMPLANT
CUTTER ECHEON FLEX ENDO 45 340 (ENDOMECHANICALS) ×3 IMPLANT
DERMABOND ADVANCED (GAUZE/BANDAGES/DRESSINGS) ×1
DERMABOND ADVANCED .7 DNX12 (GAUZE/BANDAGES/DRESSINGS) ×2 IMPLANT
DRAIN CHANNEL 28F RND 3/8 FF (WOUND CARE) IMPLANT
DRAIN CHANNEL 32F RND 10.7 FF (WOUND CARE) IMPLANT
DRAPE LAPAROSCOPIC ABDOMINAL (DRAPES) ×4 IMPLANT
DRAPE WARM FLUID 44X44 (DRAPE) ×4 IMPLANT
DRSG OPSITE POSTOP 4X6 (GAUZE/BANDAGES/DRESSINGS) ×1 IMPLANT
ELECT REM PT RETURN 9FT ADLT (ELECTROSURGICAL) ×4
ELECTRODE REM PT RTRN 9FT ADLT (ELECTROSURGICAL) ×3 IMPLANT
FILTER STRAW FLUID ASPIR (MISCELLANEOUS) IMPLANT
FORCEPS BIOP RJ4 1.8 (CUTTING FORCEPS) IMPLANT
FORCEPS RADIAL JAW LRG 4 PULM (INSTRUMENTS) IMPLANT
GAUZE SPONGE 4X4 12PLY STRL (GAUZE/BANDAGES/DRESSINGS) ×4 IMPLANT
GLOVE SURG SIGNA 7.5 PF LTX (GLOVE) ×8 IMPLANT
GOWN STRL REUS W/ TWL LRG LVL3 (GOWN DISPOSABLE) ×6 IMPLANT
GOWN STRL REUS W/ TWL XL LVL3 (GOWN DISPOSABLE) ×3 IMPLANT
GOWN STRL REUS W/TWL LRG LVL3 (GOWN DISPOSABLE) ×8
GOWN STRL REUS W/TWL XL LVL3 (GOWN DISPOSABLE) ×4
HEMOSTAT SURGICEL 2X14 (HEMOSTASIS) IMPLANT
IV CATH 22GX1 FEP (IV SOLUTION) IMPLANT
KIT BASIN OR (CUSTOM PROCEDURE TRAY) ×4 IMPLANT
KIT CLEAN ENDO COMPLIANCE (KITS) ×4 IMPLANT
KIT ROOM TURNOVER OR (KITS) ×4 IMPLANT
KIT SUCTION CATH 14FR (SUCTIONS) ×4 IMPLANT
NDL BIOPSY TRANSBRONCH 21G (NEEDLE) IMPLANT
NEEDLE 22X1 1/2 (OR ONLY) (NEEDLE) IMPLANT
NEEDLE BIOPSY TRANSBRONCH 21G (NEEDLE) IMPLANT
NS IRRIG 1000ML POUR BTL (IV SOLUTION) ×8 IMPLANT
OIL SILICONE PENTAX (PARTS (SERVICE/REPAIRS)) ×4 IMPLANT
PACK CHEST (CUSTOM PROCEDURE TRAY) ×4 IMPLANT
PAD ARMBOARD 7.5X6 YLW CONV (MISCELLANEOUS) ×8 IMPLANT
POUCH ENDO CATCH II 15MM (MISCELLANEOUS) IMPLANT
POUCH SPECIMEN RETRIEVAL 10MM (ENDOMECHANICALS) IMPLANT
RADIAL JAW LRG 4 PULMONARY (INSTRUMENTS) ×1
RELOAD STAPLE 45 GOLD REG/THCK (STAPLE) IMPLANT
SEALANT PROGEL (MISCELLANEOUS) IMPLANT
SEALANT SURG COSEAL 4ML (VASCULAR PRODUCTS) IMPLANT
SEALANT SURG COSEAL 8ML (VASCULAR PRODUCTS) IMPLANT
SOLUTION ANTI FOG 6CC (MISCELLANEOUS) ×4 IMPLANT
SPECIMEN JAR MEDIUM (MISCELLANEOUS) ×4 IMPLANT
SPONGE GAUZE 4X4 12PLY STER LF (GAUZE/BANDAGES/DRESSINGS) ×3 IMPLANT
SPONGE INTESTINAL PEANUT (DISPOSABLE) IMPLANT
SPONGE TONSIL 1 RF SGL (DISPOSABLE) ×5 IMPLANT
STAPLE RELOAD 45MM GOLD (STAPLE) ×28 IMPLANT
SUT PROLENE 4 0 RB 1 (SUTURE) ×4
SUT PROLENE 4-0 RB1 .5 CRCL 36 (SUTURE) IMPLANT
SUT PROLENE 4-0 RB1 18X2 ARM (SUTURE) ×2 IMPLANT
SUT SILK  1 MH (SUTURE) ×2
SUT SILK 1 MH (SUTURE) ×6 IMPLANT
SUT SILK 2 0SH CR/8 30 (SUTURE) IMPLANT
SUT SILK 3 0SH CR/8 30 (SUTURE) IMPLANT
SUT VIC AB 1 CTX 36 (SUTURE) ×4
SUT VIC AB 1 CTX36XBRD ANBCTR (SUTURE) IMPLANT
SUT VIC AB 2-0 CTX 36 (SUTURE) ×4 IMPLANT
SUT VIC AB 2-0 UR6 27 (SUTURE) IMPLANT
SUT VIC AB 3-0 MH 27 (SUTURE) IMPLANT
SUT VIC AB 3-0 X1 27 (SUTURE) ×4 IMPLANT
SUT VICRYL 2 TP 1 (SUTURE) IMPLANT
SWAB COLLECTION DEVICE MRSA (MISCELLANEOUS) IMPLANT
SYR 20CC LL (SYRINGE) IMPLANT
SYR 20ML ECCENTRIC (SYRINGE) ×4 IMPLANT
SYR 5ML LL (SYRINGE) ×4 IMPLANT
SYR 5ML LUER SLIP (SYRINGE) ×4 IMPLANT
SYR CONTROL 10ML LL (SYRINGE) IMPLANT
SYSTEM SAHARA CHEST DRAIN ATS (WOUND CARE) ×4 IMPLANT
TAPE CLOTH 4X10 WHT NS (GAUZE/BANDAGES/DRESSINGS) ×4 IMPLANT
TAPE CLOTH SURG 4X10 WHT LF (GAUZE/BANDAGES/DRESSINGS) ×2 IMPLANT
TIP APPLICATOR SPRAY EXTEND 16 (VASCULAR PRODUCTS) IMPLANT
TOWEL OR 17X24 6PK STRL BLUE (TOWEL DISPOSABLE) ×4 IMPLANT
TOWEL OR 17X26 10 PK STRL BLUE (TOWEL DISPOSABLE) ×8 IMPLANT
TRAP SPECIMEN MUCOUS 40CC (MISCELLANEOUS) ×8 IMPLANT
TRAY FOLEY CATH 16FRSI W/METER (SET/KITS/TRAYS/PACK) ×4 IMPLANT
TROCAR XCEL BLADELESS 5X75MML (TROCAR) ×4 IMPLANT
TROCAR XCEL NON-BLD 5MMX100MML (ENDOMECHANICALS) IMPLANT
TUBE ANAEROBIC SPECIMEN COL (MISCELLANEOUS) IMPLANT
TUBE CONNECTING 20X1/4 (TUBING) ×4 IMPLANT
TUNNELER SHEATH ON-Q 11GX8 DSP (PAIN MANAGEMENT) ×1 IMPLANT
WATER STERILE IRR 1000ML POUR (IV SOLUTION) ×8 IMPLANT

## 2016-12-26 NOTE — H&P (View-Only) (Signed)
PCP is Geoffery Lyons, MD Referring Provider is Brand Males, MD  Chief Complaint  Patient presents with  . Interstitial Lung Disease    Surgical eval..Marland KitchenHI RESOLUTION CT CHEST 10/18/16, PFT 10/14/16    HPI: Shane [name] is a 76 year old gentleman sent for consultation for a lung biopsy for interstitial lung disease.  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 He has been having problem with shortness of breath with exertion for about the past 4-5 months. He notes it significantly worsened over the past 3-4 weeks. He'll get short of breath with even minor exertion, sometimes even walking 15-20 feet. He also has had a dry cough which is periodically severe. He saw Dr. Chase Caller. He was concerned for possible interstitial lung disease due to the appearance of his chest x-ray. He ordered an autoimmune panel and a high-resolution CT. The autoimmune panel was not diagnostic. The CT showed interstitial lung disease. It was not characteristic for UIP.  In addition to the pulmonary workup he had a cardiac workup which included an echocardiogram on November 1. It showed preserved left ventricular function with grade 1 diastolic dysfunction. Aortic valve is thickened but there was no stenosis. He had a stress test on 10/26/2016 which was normal. Right heart catheterization was done on 11/09/2016. It showed mild pulmonary hypertension with a systolic of 36 mmHg.  Past Medical History:  Diagnosis Date  . Cataract   . Chest pain   . Chronic kidney disease    d/t DM  . Diabetes mellitus    Vgo disposal insulin bolus  simular to insulin pump  . GERD (gastroesophageal reflux disease)   . Hyperlipidemia   . Hypertension   . Neuromuscular disorder ([place])   . Sleep apnea     uses cpap asked to bring mask and tubing     Past Surgical History:  Procedure Laterality Date  . ANTERIOR FUSION CERVICAL SPINE  2012  . CARDIAC CATHETERIZATION  2011  . CARDIAC CATHETERIZATION N/A 11/09/2016   Procedure: Right Heart Cath;  Surgeon: Belva Crome, MD;  Location: [facility];  Service: Cardiovascular;  Laterality: N/A;  . carpel tunnel     left wrist  . CATARACT EXTRACTION W/ INTRAOCULAR LENS  IMPLANT, BILATERAL  2013  . CERVICAL LAMINECTOMY  2012  . COLONOSCOPY N/A 01/14/2013   Procedure: COLONOSCOPY;  Surgeon: Irene Shipper, MD;  Location: [facility];  Service: Endoscopy;  Laterality: N/A;  . [place]   left  . LUMBAR LAMINECTOMY  2003  . POSTERIOR FUSION CERVICAL SPINE  2012  . TRIGGER FINGER RELEASE  2011   4th finger left hand    Family History  Problem Relation Age of Onset  . Diabetes Mellitus II Mother   . Emphysema Father 23  . Colon cancer Neg Hx   . Esophageal cancer Neg Hx   . Rectal cancer Neg Hx   . Stomach cancer Neg Hx     Social History Social History  Substance Use Topics  . Smoking status: Never Smoker  . Smokeless tobacco: Never Used  . Alcohol use No    Current Outpatient Prescriptions  Medication Sig Dispense Refill  . aspirin 81 MG tablet Take 81 mg by mouth daily.      . dapagliflozin propanediol (FARXIGA) 5 MG TABS tablet Take 5 mg by mouth daily.    Marland Kitchen  diazepam (VALIUM) 5 MG tablet Take 5 mg by mouth 3 (three) times daily as needed (for sleep).     . furosemide (LASIX) 40 MG tablet Take 40 mg by mouth daily.     . insulin lispro (HUMALOG KWIKPEN) 100 UNIT/ML injection Inject 20 Units into the skin 3 (three) times daily before meals. Sliding scale    . insulin lispro (HUMALOG) 100 UNIT/ML injection Inject 100 Units into the skin continuous. Use over continuous rate in v-go bag over 24 hours    . Insulin Pump Disposable (V-GO 40) KIT 100 Units by Does not apply route daily. humalog 100 units dispensed every 24 hours. Dispose v-go bag after 24 hours of  use.    Marland Kitchen KLOR-CON M20 20 MEQ tablet Take 20 mEq by mouth daily.     Marland Kitchen lisinopril (PRINIVIL,ZESTRIL) 20 MG tablet Take 20 mg by mouth daily.     . Multiple Vitamin (MULTIVITAMIN) tablet Take 1 tablet by mouth daily.      . nebivolol (BYSTOLIC) 10 MG tablet Take 5 mg by mouth daily.     . nitroGLYCERIN (NITROSTAT) 0.4 MG SL tablet Place 0.4 mg under the tongue every 5 (five) minutes as needed. For chest pain    . pantoprazole (PROTONIX) 40 MG tablet Take 40 mg by mouth daily.      Marland Kitchen PROAIR HFA 108 (90 Base) MCG/ACT inhaler Inhale 2 puffs into the lungs every 4 (four) hours as needed for wheezing or shortness of breath.     . promethazine-phenylephrine (PROMETHAZINE VC PLAIN) 6.25-5 MG/5ML SYRP Take 5 mLs by mouth every 4 (four) hours as needed for congestion.     No current facility-administered medications for this visit.     Allergies  Allergen Reactions  . Codeine Hives and Itching    Review of Systems  Constitutional: Positive for activity change and fatigue. Negative for chills, fever and unexpected weight change.  HENT: Positive for dental problem and hearing loss. Negative for trouble swallowing and voice change.   Eyes: Negative for visual disturbance.  Respiratory: Positive for apnea, cough (Nonproductive) and shortness of breath. Negative for wheezing.   Cardiovascular: Negative for chest pain, palpitations and leg swelling.  Gastrointestinal: Negative for abdominal pain and blood in stool.  Genitourinary: Negative for dysuria and frequency.  Musculoskeletal: Positive for gait problem (Due to neuropathy). Negative for arthralgias and myalgias.  Neurological: Positive for numbness (Neuropathy in feet bilaterally). Negative for syncope.  Hematological: Negative for adenopathy. Bruises/bleeds easily.  All other systems reviewed and are negative.   BP 110/60 (BP Location: Right Arm, Patient Position: Sitting, Cuff Size: Large)   Pulse 79   Resp 16   Ht 6' (1.829 m)   Wt 270  lb (122.5 kg)   SpO2 96% Comment: ON RA  BMI 36.62 kg/m  Physical Exam  Constitutional: He is oriented to person, place, and time. He appears well-developed and well-nourished. No distress.  HENT:  Head: Normocephalic and atraumatic.  Mouth/Throat: No oropharyngeal exudate.  Eyes: Conjunctivae and EOM are normal. No scleral icterus.  Neck: Neck supple. No thyromegaly present.  Cardiovascular: Normal rate, regular rhythm and normal heart sounds.  Exam reveals no gallop and no friction rub.   No murmur heard. Pulmonary/Chest: Effort normal. He has no wheezes. He has rales (Bilaterally).  Abdominal: Soft. He exhibits no distension. There is no tenderness.  Musculoskeletal: He exhibits no edema.  Lymphadenopathy:    He has no cervical adenopathy.  Neurological: He is alert and oriented to  person, place, and time. No cranial nerve deficit.  Good strength all 4 extremities  Skin: Skin is warm and dry.  Vitals reviewed.    Diagnostic Tests: CT CHEST WITHOUT CONTRAST  TECHNIQUE: Multidetector CT imaging of the chest was performed following the standard protocol without intravenous contrast. High resolution imaging of the lungs, as well as inspiratory and expiratory imaging, was performed.  COMPARISON:  12/05/2008 high-resolution chest CT.  FINDINGS: Cardiovascular: Mild cardiomegaly, stable. No significant pericardial fluid/thickening. Left main, left anterior descending, left circumflex and right coronary atherosclerosis. Atherosclerotic nonaneurysmal thoracic aorta. Dilated main pulmonary artery (3.8 cm diameter, increased from 3.4 cm).  Mediastinum/Nodes: No discrete thyroid nodules. Unremarkable esophagus. No axillary adenopathy. Mildly enlarged 1.4 cm right lower paraesophageal node (series 4/ image 75) is stable since 12/05/2008. No additional pathologically enlarged mediastinal or gross hilar nodes on this noncontrast study.  Lungs/Pleura: No pneumothorax. No  pleural effusion. No acute consolidative airspace disease, lung masses or significant pulmonary nodules. There is patchy reticulation and ground-glass attenuation throughout both lungs predominantly involving the subpleural lungs with lesser involvement of the peribronchovascular lungs. There is associated mild traction bronchiectasis and mild architectural distortion, most prominent in the left upper lobe. No frank honeycombing. No basilar gradient. Findings have mildly progressed since 12/05/2008. There is moderate patchy air trapping in both lungs on the expiration sequence.  Upper abdomen: Unremarkable.  Musculoskeletal: No aggressive appearing focal osseous lesions. Partially visualized surgical hardware from ACDF in the lower cervical spine. Marked thoracic spondylosis.  IMPRESSION: 1. Fibrotic interstitial lung disease characterized by patchy subpleural and peribronchovascular reticulation and ground-glass attenuation, mild traction bronchiectasis and mild architectural distortion, with no clear basilar gradient and no frank honeycombing. Mild progression of disease since 12/05/2008 chest CT. Moderate patchy air trapping in both lungs. 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 Nonspecific interstitial pneumonia (NSIP) is a consideration. Findings are not consistent with usual interstitial pneumonia (UIP). 2. Stable mild mediastinal lymphadenopathy, consistent with benign reactive etiology. 3. Aortic atherosclerosis. Left main and 3 vessel coronary atherosclerosis. 4. Stable mild cardiomegaly. 5. Worsening main pulmonary artery dilation, suggesting worsening pulmonary arterial hypertension.   Electronically Signed   By: Ilona Sorrel M.D.   On: 10/18/2016 09:30  Pulmonary some testing 10/14/2016  FVC 3.00 (63%) FEV1 2.70 (78%) No significant change with bronchodilators DLCO 16.82 (46%)  Impression: Shane [name] is a 76 year old  lifelong nonsmoker who presents with cough and dyspnea. 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 He needs a surgical lung biopsy for diagnostic purposes to define the underlying condition.  I discussed the proposed procedure of left VATS for lung biopsy with Mr. and Mrs. [name]. They understand this is a diagnostic procedure and will not improve his symptoms or treat the underlying condition. I informed him of the general nature of the procedure including the need for general anesthesia, the incisions to be used, use of a drainage tube postoperatively, the expected hospital stay, and the overall recovery. I informed him of the indications, risks, benefits, and alternatives. They understand the risk include, but are not limited to death, MI, DVT, PE, bleeding, possible need for transfusion, infection, prolonged air leak, cardiac arrhythmias, as well as the possibility of other unforeseeable complications.  He accepts the risk and wish to proceed.  He has a loose tooth and needs to be pulled. That is being done on January 9. He wants to wait until after that before having his lung surgery.  We have set a date  for Wednesday, 12/21/2016.  Plan:  Left VATS lung biopsy on 12/21/2016  [name], MD [facility] [phone]  I also discussed with Mr. and Mrs. [name] the operating study in which Dr. Chase Caller is participating. If he likes to do that it would involve bronchoscopy with transbronchial biopsies and bronchoalveolar lavage. I discussed the nature of that procedure with them as well.  Revonda Standard Roxan Hockey, MD [facility] [phone]

## 2016-12-26 NOTE — Progress Notes (Signed)
Patients HR dropping down into the 30's but not sustaining. Pt in HB type 1 but then moments of wenckebach. Notified MD to come to bedside. Will continue to monitor.

## 2016-12-26 NOTE — Transfer of Care (Signed)
Immediate Anesthesia Transfer of Care Note  Patient: Shane [name]  Procedure(s) Performed: Procedure(s): VIDEO BRONCHOSCOPY (N/A) VIDEO ASSISTED THORACOSCOPY (Left) LUNG BIOPSY (Left)  Patient Location: PACU  Anesthesia Type:General  Level of Consciousness: awake, alert , oriented and patient cooperative  Airway & Oxygen Therapy: Patient Spontanous Breathing and Patient connected to nasal cannula oxygen  Post-op Assessment: Report given to RN and Post -op Vital signs reviewed and stable  Post vital signs: Reviewed  Last Vitals: 181/64, 89, 24, 96% Vitals:   12/26/16 0619  BP: (!) 161/62  Pulse: 62  Resp: 20  Temp: 36.9 C    Last Pain:  Vitals:   12/26/16 0619  TempSrc: Oral         Complications: No apparent anesthesia complications

## 2016-12-26 NOTE — Progress Notes (Signed)
[address]       [place] [address]             [phone]      Day of Surgery Procedure(s) (LRB): VIDEO BRONCHOSCOPY (N/A) VIDEO ASSISTED THORACOSCOPY (Left) LUNG BIOPSY (Left) Subjective: Having intermit. Wenckebach second deg AVB with bradycardia  Objective: Vital signs in last 24 hours: Temp:  [97 F (36.1 C)-98.4 F (36.9 C)] 97.6 F (36.4 C) (01/22 1257) Pulse Rate:  [39-66] 39 (01/22 1413) Cardiac Rhythm: Normal sinus rhythm;Heart block;Bundle branch block (01/22 1311) Resp:  [6-20] 19 (01/22 1541) BP: (140-162)/(58-88) 140/58 (01/22 1257) SpO2:  [99 %-100 %] 100 % (01/22 1541) Arterial Line BP: (154-196)/(52-92) 184/69 (01/22 1143) Weight:  [277 lb 1.9 oz (125.7 kg)] 277 lb 1.9 oz (125.7 kg) (01/22 1257)  Hemodynamic parameters for last 24 hours:    Intake/Output from previous day: No intake/output data recorded. Intake/Output this shift: Total I/O In: 1100 [I.V.:1100] Out: 360 [Urine:300; Blood:50; Chest Tube:10]  General appearance: alert, cooperative and no distress Heart: regular rate and rhythm Lungs: clear anteriorly  Lab Results: No results for input(s): WBC, HGB, HCT, PLT in the last 72 hours. BMET: No results for input(s): NA, K, CL, CO2, GLUCOSE, BUN, CREATININE, CALCIUM in the last 72 hours.  PT/INR: No results for input(s): LABPROT, INR in the last 72 hours. ABG    Component Value Date/Time   PHART 7.376 12/26/2016 0643   HCO3 25.2 12/26/2016 0643   TCO2 31 11/09/2016 1458   ACIDBASEDEF TEST WILL BE CREDITED 12/19/2016 1333   O2SAT 93.9 12/26/2016 0643   CBG (last 3)   Recent Labs  12/26/16 1225 12/26/16 1344 12/26/16 1507  GLUCAP 158* 163* 154*    Meds Scheduled Meds: . acetaminophen  1,000 mg Oral Q6H   Or  . acetaminophen (TYLENOL) oral liquid 160 mg/5 mL  1,000 mg Oral Q6H  . albuterol  2.5 mg Nebulization Q4H while awake  . [START ON 12/27/2016] aspirin EC  81 mg Oral Daily  . bisacodyl  10 mg  Oral Daily  . cefUROXime (ZINACEF)  IV  1.5 g Intravenous Q12H  . diclofenac  75 mg Oral BID  . fentaNYL   Intravenous Q4H  . fentaNYL      . insulin regular  0-10 Units Intravenous TID WC  . [START ON 12/27/2016] lisinopril  20 mg Oral Daily  . pantoprazole  40 mg Oral Daily  . [START ON 12/27/2016] potassium chloride SA  20 mEq Oral Daily  . senna-docusate  1 tablet Oral QHS  . [START ON 12/27/2016] triamterene-hydrochlorothiazide  1 tablet Oral Daily   Continuous Infusions: . sodium chloride    . bupivacaine ON-Q pain pump    . dextrose 5 % and 0.45% NaCl 10 mL/hr at 12/26/16 1256  . insulin (NOVOLIN-R) infusion 4.1 Units/hr (12/26/16 1346)   PRN Meds:.albuterol, dextrose, diazepam, diphenhydrAMINE **OR** diphenhydrAMINE, naloxone **AND** sodium chloride flush, ondansetron (ZOFRAN) IV, ondansetron (ZOFRAN) IV, [START ON 12/27/2016] potassium chloride (KCL MULTIRUN) 30 mEq in 265 mL IVPB, traMADol  Xrays Dg Chest Port 1 View  Result Date: 12/26/2016 CLINICAL DATA:  Lung surgery.  Chest tube . EXAM: PORTABLE CHEST 1 VIEW COMPARISON:  12/19/2016. FINDINGS: Left chest tube, left IJ line in good anatomic position. Cardiomegaly again noted. Bilateral interstitial prominence again noted. Interstitial prominence is increased from prior exam is consistent with interstitial edema and/or pneumonitis. Small left pleural effusion. No pneumothorax. Prior cervical spine fusion. IMPRESSION: 1. Left  chest tube and left IJ line in good anatomic position. No pneumothorax. 2. Cardiomegaly with bilateral diffuse interstitial prominence. Findings consistent congestive heart failure interstitial edema. Interstitial pneumonitis cannot be excluded. Underlying chronic interstitial lung disease most likely present. Electronically Signed   By: Marcello Moores  Register   On: 12/26/2016 11:00    Assessment/Plan: S/P Procedure(s) (LRB): VIDEO BRONCHOSCOPY (N/A) VIDEO ASSISTED THORACOSCOPY (Left) LUNG BIOPSY  (Left)  Intermittent type 1 second deg AVB- appears asymptomatic at this time, oxygenating well at bed rest. Will stop betablocker at this time and obtain cardiology consult per Dr Hendrickson's request.   LOS: 0 days    Shane [name],Shane [name] 12/26/2016

## 2016-12-26 NOTE — Anesthesia Postprocedure Evaluation (Signed)
Anesthesia Post Note  Patient: DAMEER SPEISER  Procedure(s) Performed: Procedure(s) (LRB): VIDEO BRONCHOSCOPY (N/A) VIDEO ASSISTED THORACOSCOPY (Left) LUNG BIOPSY (Left)  Patient location during evaluation: PACU Anesthesia Type: General Level of consciousness: awake and alert, oriented and patient cooperative Pain management: pain level controlled Vital Signs Assessment: post-procedure vital signs reviewed and stable Respiratory status: spontaneous breathing, nonlabored ventilation, respiratory function stable and patient connected to nasal cannula oxygen Cardiovascular status: blood pressure returned to baseline and stable Postop Assessment: no signs of nausea or vomiting Anesthetic complications: no       Last Vitals:  Vitals:   12/26/16 1158 12/26/16 1257  BP: (!) 143/60 (!) 140/58  Pulse: 64   Resp: (!) 8   Temp:  36.4 C    Last Pain:  Vitals:   12/26/16 1257  TempSrc: Oral  PainSc: Asleep                 JACKSON,E. CARSWELL

## 2016-12-26 NOTE — Consult Note (Signed)
Cardiology Consult    Patient ID: Shane [name] MRN: [id number], DOB/AGE: October 27, 1941   Admit date: 12/26/2016 Date of Consult: 12/26/2016  Primary Physician: Geoffery Lyons, MD Reason for Consult: Bradycardia Primary Cardiologist: [name] Requesting Provider: Dr. Roxan Hockey   History of Present Illness    Shane [name] is a 76 y.o. male with past medical history of IDDM, HTN, HLD, Stage 3 CKD, OSA and low-risk NST in 10/2016 who has known fibrotic interstitial lung disease who presented to [facility] today for a video bronchoscopy, thoracoscopy, and lung biopsy by Dr. Roxan Hockey.   No immediate complications were noted during the procedure.   Cardiology is consulted as he is having intermittent episodes of Wenckebach on telemetry with HR into the mid-30's at times following his procedure. Was on Bystolic '5mg'$  daily with last dose being this morning.   He is asymptomatic with this. Denies any chest discomfort, dyspnea, lightheadedness, dizziness, or presyncope.   Last cardiac catheterization was a RHC on 11/09/2016 which showed minimally elevated pulmonary artery pressures with a PA systolic pressure of 36 mmg Hg with normal pulmonary capillary wedge pressure. Echo in 10/2016 showed a preserved EF of 55-65% with no wall motion abnormalities. LHC in 4081 showed LV systolic function of 44-81% with mild CAD.   Past Medical History   Past Medical History:  Diagnosis Date  . Cancer ([place])    ling  . Cataract   . Chest pain   . Chronic kidney disease    d/t DM  . Diabetes mellitus    Vgo disposal insulin bolus  simular to insulin pump  . Dyspnea   . GERD (gastroesophageal reflux disease)   . Hyperlipidemia   . Hypertension   . Neuromuscular disorder ([place])   . PONV (postoperative nausea and vomiting)   . Sleep apnea     uses cpap asked to bring mask and tubing    Past Surgical History:  Procedure Laterality Date  . ANTERIOR FUSION CERVICAL SPINE  2012  . CARDIAC  CATHETERIZATION  2011  . CARDIAC CATHETERIZATION N/A 11/09/2016   Procedure: Right Heart Cath;  Surgeon: Belva Crome, MD;  Location: [facility];  Service: Cardiovascular;  Laterality: N/A;  . carpel tunnel     left wrist  . CATARACT EXTRACTION W/ INTRAOCULAR LENS  IMPLANT, BILATERAL  2013  . CERVICAL LAMINECTOMY  2012  . COLONOSCOPY N/A 01/14/2013   Procedure: COLONOSCOPY;  Surgeon: Irene Shipper, MD;  Location: [facility];  Service: Endoscopy;  Laterality: N/A;  . EYE SURGERY    . [place]   left  . LUMBAR LAMINECTOMY  2003  . POSTERIOR FUSION CERVICAL SPINE  2012  . TRIGGER FINGER RELEASE  2011   4th finger left hand     Allergies  Allergies  Allergen Reactions  . Codeine Hives and Itching    Inpatient Medications    . acetaminophen  1,000 mg Oral Q6H   Or  . acetaminophen (TYLENOL) oral liquid 160 mg/5 mL  1,000 mg Oral Q6H  . albuterol  2.5 mg Nebulization Q4H while awake  . [START ON 12/27/2016] aspirin EC  81 mg Oral Daily  . bisacodyl  10 mg Oral Daily  . cefUROXime (ZINACEF)  IV  1.5 g Intravenous Q12H  . diclofenac  75 mg Oral BID  . fentaNYL   Intravenous Q4H  . fentaNYL      . insulin regular  0-10 Units Intravenous TID WC  . [START  ON 12/27/2016] lisinopril  20 mg Oral Daily  . pantoprazole  40 mg Oral Daily  . [START ON 12/27/2016] potassium chloride SA  20 mEq Oral Daily  . senna-docusate  1 tablet Oral QHS  . [START ON 12/27/2016] triamterene-hydrochlorothiazide  1 tablet Oral Daily    Family History    Family History  Problem Relation Age of Onset  . Diabetes Mellitus II Mother   . Emphysema Father 80  . Colon cancer Neg Hx   . Esophageal cancer Neg Hx   . Rectal cancer Neg Hx   . Stomach cancer Neg Hx     Social History    Social History   Social History  . Marital status: Married    Spouse name: N/A  . Number of children: N/A  . Years of education: N/A   Occupational History  . Not on file.   Social History Main  Topics  . Smoking status: Never Smoker  . Smokeless tobacco: Never Used  . Alcohol use No  . Drug use: No  . Sexual activity: Not on file   Other Topics Concern  . 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      Review of Systems    General:  No chills, fever, night sweats or weight changes.  Cardiovascular:  No chest pain, dyspnea on exertion, edema, orthopnea, palpitations, paroxysmal nocturnal dyspnea. Dermatological: No rash, lesions/masses Respiratory: No cough, dyspnea Urologic: No hematuria, dysuria Abdominal:   No nausea, vomiting, diarrhea, bright red blood per rectum, melena, or hematemesis Neurologic:  No visual changes, wkns, changes in mental status. All other systems reviewed and are otherwise negative except as noted above.  Physical Exam    Blood pressure 124/67, pulse (!) 59, temperature 97.9 F (36.6 C), temperature source Oral, resp. rate 13, height 6' (1.829 m), weight 277 lb 1.9 oz (125.7 kg), SpO2 99 %.  General: Pleasant, elderly male appearing in NAD Psych: Normal affect. Neuro: Alert and oriented X 3. Moves all extremities spontaneously. HEENT: Normal  Neck: Supple without bruits or JVD. Lungs:  Resp regular and unlabored, CTA without wheezing or rales.Chest tube noted Heart: RRR no s3, s4, or murmurs. Abdomen: Soft, non-tender, non-distended, BS + x 4.  Extremities: No clubbing, cyanosis or edema. DP/PT/Radials 2+ and equal bilaterally.  Labs    Troponin (Point of Care Test) No results for input(s): TROPIPOC in the last 72 hours. No results for input(s): CKTOTAL, CKMB, TROPONINI in the last 72 hours. Lab Results  Component Value Date   WBC 8.4 12/19/2016   HGB 14.7 12/19/2016   HCT 42.7 12/19/2016   MCV 90.5 12/19/2016   PLT 254 12/19/2016   No results for input(s): NA, K, CL, CO2, BUN, CREATININE, CALCIUM, PROT, BILITOT, ALKPHOS, ALT, AST, GLUCOSE in the last 168 hours.  Radiology Studies    Dg Chest 2  View  Result Date: 12/19/2016 CLINICAL DATA:  Preoperative imaging for lung biopsy scheduled 12/21/2016. History of diabetes. EXAM: CHEST  2 VIEW COMPARISON:  Chest CT dated 10/18/2016 and chest x-ray dated 09/27/2011. FINDINGS: Coarse interstitial markings are again seen throughout both lungs, similar to findings on recent chest CT, at least mildly progressed compared to the earlier chest x-ray of 09/27/2011. No new confluent airspace opacity to suggest a developing pneumonia. No pleural effusion seen. Cardiomediastinal silhouette is stable. No acute or suspicious osseous finding. IMPRESSION: No acute findings.  No evidence of pneumonia. 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 Electronically Signed   By: Franki Cabot M.D.   On: 12/19/2016 15:46   Dg Chest Port 1 View  Result Date: 12/26/2016 CLINICAL DATA:  Lung surgery.  Chest tube . EXAM: PORTABLE CHEST 1 VIEW COMPARISON:  12/19/2016. FINDINGS: Left chest tube, left IJ line in good anatomic position. Cardiomegaly again noted. Bilateral interstitial prominence again noted. Interstitial prominence is increased from prior exam is consistent with interstitial edema and/or pneumonitis. Small left pleural effusion. No pneumothorax. Prior cervical spine fusion. IMPRESSION: 1. Left chest tube and left IJ line in good anatomic position. No pneumothorax. 2. Cardiomegaly with bilateral diffuse interstitial prominence. Findings consistent congestive heart failure interstitial edema. Interstitial pneumonitis cannot be excluded. Underlying chronic interstitial lung disease most likely present. Electronically Signed   By: Marcello Moores  Register   On: 12/26/2016 11:00    EKG & Cardiac Imaging    EKG: Sinus  bradycardia, HR 58, with 1st degree AV Block and known RBBB.  Echocardiogram: 10/05/2016 Study Conclusions  - Left ventricle: The cavity size was normal. Wall thickness was   increased in a pattern of moderate LVH. Systolic function was   normal. The estimated ejection fraction was in the range of 55%   to 65%. Wall motion was normal; there were no regional wall   motion abnormalities. Doppler parameters are consistent with   abnormal left ventricular relaxation (grade 1 diastolic   dysfunction). - Aortic valve: Mildly to moderately calcified annulus. Moderately   thickened, moderately calcified leaflets.  Assessment & Plan    1. Sinus Bradycardia - HR in mid-30's to 50's with episodes of intermittent Wenckebach on telemetry following his procedure. Was on Bystolic '5mg'$  daily with last dose being this morning. Will discontinue BB for now. Continue to monitor on telemetry. No further interventions indicated at this time.   2. HTN - BP at 124/58 - 162/88 in the past 24 hours. - stop Bystolic as above. Continue Lisinopril '20mg'$  daily. Can further titrate Lisinopril if BP remains elevated or start Amlodipine.   3. Fibrotic interstitial lung disease  - s/p video bronchoscopy, thoracoscopy, and lung biopsy by Dr. Roxan Hockey on 12/26/2016. - per admitting team    Signed, Erma Heritage, PA-C 12/26/2016, 4:29 PM Pager: [phone]  Personally seen and examined. Agree with above.  Reviewed telemetry personally. Wenckebach phenomenon intermittent. Group beating noted. We will go ahead and stop the Bystolic, beta blocker. If additional hypertension medication is necessary, consider amlodipine.  Explained phenomenon taking his wife. 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 For now, beta blocker cessation should be adequate.  No syncope at home. No  chest pain.  Candee Furbish, MD

## 2016-12-26 NOTE — Anesthesia Procedure Notes (Addendum)
Procedure Name: Intubation Date/Time: 12/26/2016 7:52 AM Performed by: Marchelle Folks ANN Pre-anesthesia Checklist: Patient identified, Emergency Drugs available, Suction available, Patient being monitored and Timeout performed Patient Re-evaluated:Patient Re-evaluated prior to inductionOxygen Delivery Method: Circle system utilized Preoxygenation: Pre-oxygenation with 100% oxygen Intubation Type: IV induction Ventilation: Two handed mask ventilation required and Oral airway inserted - appropriate to patient size Laryngoscope Size: Glidescope and 4 Grade View: Grade I Tube type: Oral Endobronchial tube: Left and Double lumen EBT and 39 Fr Tube size: 8.0 mm Number of attempts: 1 Airway Equipment and Method: Stylet and Video-laryngoscopy Placement Confirmation: ETT inserted through vocal cords under direct vision,  positive ETCO2 and breath sounds checked- equal and bilateral Secured at: 24 cm Tube secured with: Tape (ETT first, then changed to DLT over Cook catheter) Dental Injury: Teeth and Oropharynx as per pre-operative assessment

## 2016-12-26 NOTE — Anesthesia Procedure Notes (Signed)
Central Venous Catheter Insertion Performed by: Annye Asa, anesthesiologist Start/End1/22/2018 6:53 AM, 12/26/2016 7:05 AM Patient location: Pre-op. Preanesthetic checklist: patient identified, IV checked, site marked, risks and benefits discussed, surgical consent, monitors and equipment checked, pre-op evaluation, timeout performed and anesthesia consent Position: Trendelenburg Lidocaine 1% used for infiltration and patient sedated Hand hygiene performed , maximum sterile barriers used  and Seldinger technique used Catheter size: 8 Fr Central line was placed.Double lumen Procedure performed using ultrasound guided technique. Ultrasound Notes:anatomy identified, needle tip was noted to be adjacent to the nerve/plexus identified, no ultrasound evidence of intravascular and/or intraneural injection and image(s) printed for medical record Attempts: 1 Following insertion, line sutured, dressing applied and Biopatch. Post procedure assessment: blood return through all ports, free fluid flow and no air  Patient tolerated the procedure well with no immediate complications. Additional procedure comments: CVP: Timeout, sterile prep, drape, FBP L neck.  Trendelenburg position.  1% lido local, finder and trocar LIJ 1st pass with US guidance.  2 lumen placed over J wire. Biopatch and sterile dressing on.  Patient tolerated well.  VSS.  Jenita Seashore, MD.

## 2016-12-26 NOTE — Progress Notes (Addendum)
Inpatient Diabetes Program Recommendations  AACE/ADA: New Consensus Statement on Inpatient Glycemic Control (2015)  Target Ranges:  Prepandial:   less than 140 mg/dL      Peak postprandial:   less than 180 mg/dL (1-2 hours)      Critically ill patients:  140 - 180 mg/dL   Lab Results  Component Value Date   GLUCAP 163 (H) 12/26/2016   HGBA1C 7.9 (H) 12/19/2016   Results for PINCHAS, REITHER (MRN [id number]) as of 12/26/2016 14:31  Ref. Range 12/26/2016 09:16 12/26/2016 10:29 12/26/2016 11:26 12/26/2016 12:25 12/26/2016 13:44  Glucose-Capillary Latest Ref Range: 65 - 99 mg/dL 218 (H) 187 (H) 176 (H) 158 (H) 163 (H)   Review of Glycemic Control  Diabetes history: DM on V-GO pump Outpatient Diabetes medications:     Levemir 22 units QHS (if patient is taking this - Levemir should be started 1-2 hours prior to stopping IV insulin,     V-GO pump with 76 units total insulin dosage over 24 hours  Current orders for Inpatient glycemic control:     IV Insulin  Inpatient Diabetes Program Recommendations:     Patient may resume V-GO pump at settings specified by outpatient MD who manages pump if patient or support person can manage pump at bedside at all times.       MD:       Please order pump order set.       Nursing:        Please have patient sign pump consent form and nursing needs to continue to monitor CBG's.       When pump is started and Levemir given, please continue IV insulin for 1 hour.          Note: Spoke with patient and wife at bedside and called Dr. Delfino Lovett Aronson's office to verify pump settings and Levemir dosage.  Levemir 22 units is given SQ QHS and V-GO insulin sensitivity factor is 20 mg/dL drop in CBG with 1 unit of insulin with a target CBG of 125 mg/dL.  Basal amount is 40 units/24 hours.  Unable to identify meal coverage and correction settings for pump as Pharmacist Oren Section) was not in office (last met with patient on 11/08/2016).  Wife unable to bring in pump  supplies until tomorrow. Morning.  Will follow up with wife and patient in am.  Please continue with IV insulin until pump is started.  Thank you,  Windy Carina, RN, MSN Diabetes Coordinator Inpatient Diabetes Program [phone] (Team Pager)

## 2016-12-26 NOTE — Brief Op Note (Addendum)
12/26/2016  10:02 AM  PATIENT:  Shane [name]  76 y.o. male  PRE-OPERATIVE DIAGNOSIS:  ILD  POST-OPERATIVE DIAGNOSIS:  Interstitial Lung Disease  PROCEDURE:  Procedure(s):  VIDEO BRONCHOSCOPY (N/A) with BAL and biopsies VIDEO ASSISTED THORACOSCOPY (Left) LUNG BIOPSY  -Left upper lobe x 2 -Left lower lobe  SURGEON:  Surgeon(s) and Role:    * [name], MD - Primary  PHYSICIAN ASSISTANT: Ellwood Handler PA-C  ANESTHESIA:   general  EBL:  Total I/O In: 906 [I.V.:906] Out: 300 [Urine:300]  BLOOD ADMINISTERED:none  DRAINS: 28 Straight Chest Tube   LOCAL MEDICATIONS USED:  MARCAINE     SPECIMEN:  Source of Specimen:  Lung Biopsy, LUL x 2, LLL  DISPOSITION OF SPECIMEN:  PATHOLOGY  COUNTS:  YES  PLAN OF CARE: Admit to inpatient   PATIENT DISPOSITION:  ICU - extubated and stable.   Delay start of Pharmacological VTE agent (>24hrs) due to surgical blood loss or risk of bleeding: yes  Frozen showed ILD

## 2016-12-26 NOTE — Interval H&P Note (Signed)
History and Physical Interval Note:  12/26/2016 7:30 AM  Shane [name]  has presented today for surgery, with the diagnosis of ILD  The various methods of treatment have been discussed with the patient and family. After consideration of risks, benefits and other options for treatment, the patient has consented to  Procedure(s): VIDEO BRONCHOSCOPY (N/A) VIDEO ASSISTED THORACOSCOPY (Left) LUNG BIOPSY (Left) as a surgical intervention .  The patient's history has been reviewed, patient examined, no change in status, stable for surgery.  I have reviewed the patient's chart and labs.  Questions were answered to the patient's satisfaction.     [name]

## 2016-12-26 NOTE — Progress Notes (Signed)
Spoke with Diabetes coordinator concerning patients insulin gtt. Told to keep insulin gtt on and do 1 hour blood sugar checks until tomorrow when wife can bring patients personal insulin pump.

## 2016-12-27 ENCOUNTER — Encounter (HOSPITAL_COMMUNITY): Payer: Self-pay | Admitting: Thoracic Surgery (Cardiothoracic Vascular Surgery)

## 2016-12-27 ENCOUNTER — Inpatient Hospital Stay (HOSPITAL_COMMUNITY): Payer: Medicare Other

## 2016-12-27 LAB — CBC
HCT: 36.6 % — ABNORMAL LOW (ref 39.0–52.0)
Hemoglobin: 12.1 g/dL — ABNORMAL LOW (ref 13.0–17.0)
MCH: 30.2 pg (ref 26.0–34.0)
MCHC: 33.1 g/dL (ref 30.0–36.0)
MCV: 91.3 fL (ref 78.0–100.0)
Platelets: 179 10*3/uL (ref 150–400)
RBC: 4.01 MIL/uL — ABNORMAL LOW (ref 4.22–5.81)
RDW: 13.9 % (ref 11.5–15.5)
WBC: 8.2 10*3/uL (ref 4.0–10.5)

## 2016-12-27 LAB — GLUCOSE, CAPILLARY
Glucose-Capillary: 126 mg/dL — ABNORMAL HIGH (ref 65–99)
Glucose-Capillary: 137 mg/dL — ABNORMAL HIGH (ref 65–99)
Glucose-Capillary: 135 mg/dL — ABNORMAL HIGH (ref 65–99)
Glucose-Capillary: 130 mg/dL — ABNORMAL HIGH (ref 65–99)
Glucose-Capillary: 135 mg/dL — ABNORMAL HIGH (ref 65–99)
Glucose-Capillary: 245 mg/dL — ABNORMAL HIGH (ref 65–99)
Glucose-Capillary: 266 mg/dL — ABNORMAL HIGH (ref 65–99)
Glucose-Capillary: 231 mg/dL — ABNORMAL HIGH (ref 65–99)
Glucose-Capillary: 229 mg/dL — ABNORMAL HIGH (ref 65–99)
Glucose-Capillary: 235 mg/dL — ABNORMAL HIGH (ref 65–99)
Glucose-Capillary: 227 mg/dL — ABNORMAL HIGH (ref 65–99)
Glucose-Capillary: 176 mg/dL — ABNORMAL HIGH (ref 65–99)
Glucose-Capillary: 150 mg/dL — ABNORMAL HIGH (ref 65–99)
Glucose-Capillary: 144 mg/dL — ABNORMAL HIGH (ref 65–99)
Glucose-Capillary: 151 mg/dL — ABNORMAL HIGH (ref 65–99)
Glucose-Capillary: 149 mg/dL — ABNORMAL HIGH (ref 65–99)

## 2016-12-27 LAB — ACID FAST SMEAR (AFB, MYCOBACTERIA): Acid Fast Smear: NEGATIVE

## 2016-12-27 LAB — BASIC METABOLIC PANEL
Anion gap: 10 (ref 5–15)
BUN: 25 mg/dL — ABNORMAL HIGH (ref 6–20)
CO2: 22 mmol/L (ref 22–32)
Calcium: 8 mg/dL — ABNORMAL LOW (ref 8.9–10.3)
Chloride: 100 mmol/L — ABNORMAL LOW (ref 101–111)
Creatinine, Ser: 1.66 mg/dL — ABNORMAL HIGH (ref 0.61–1.24)
GFR calc Af Amer: 45 mL/min — ABNORMAL LOW (ref >60)
GFR calc non Af Amer: 39 mL/min — ABNORMAL LOW (ref >60)
Glucose, Bld: 135 mg/dL — ABNORMAL HIGH (ref 65–99)
Potassium: 4 mmol/L (ref 3.5–5.1)
Sodium: 132 mmol/L — ABNORMAL LOW (ref 135–145)

## 2016-12-27 MED ORDER — INSULIN LISPRO 100 UNIT/ML ~~LOC~~ SOLN: 76.0000 [IU] | SUBCUTANEOUS | Status: DC

## 2016-12-27 MED ORDER — ALBUTEROL SULFATE (2.5 MG/3ML) 0.083% IN NEBU
2.5000 mg | INHALATION_SOLUTION | Freq: Two times a day (BID) | RESPIRATORY_TRACT | Status: DC
  Administered 2016-12-27: 2.5 mg via RESPIRATORY_TRACT
  Filled 2016-12-27 (×2): qty 3

## 2016-12-27 MED ORDER — FUROSEMIDE 10 MG/ML IJ SOLN
40.0000 mg | Freq: Once | INTRAMUSCULAR | Status: AC
  Administered 2016-12-27: 40 mg via INTRAVENOUS
  Filled 2016-12-27: qty 4

## 2016-12-27 MED ORDER — SODIUM CHLORIDE 0.9 % IV SOLN
INTRAVENOUS | Status: DC
  Administered 2016-12-27: 5 [IU]/h via INTRAVENOUS
  Filled 2016-12-27 (×2): qty 2.5

## 2016-12-27 MED ORDER — INSULIN PUMP
SUBCUTANEOUS | Status: DC
  Filled 2016-12-27: qty 1

## 2016-12-27 NOTE — Progress Notes (Addendum)
[address]       [place] [address]             [phone]      1 Day Post-Op Procedure(s) (LRB): VIDEO BRONCHOSCOPY (N/A) VIDEO ASSISTED THORACOSCOPY (Left) LUNG BIOPSY (Left) Subjective: Feels pretty well, appreciate cardiology input  Objective: Vital signs in last 24 hours: Temp:  [97 F (36.1 C)-98.7 F (37.1 C)] 98.4 F (36.9 C) (01/23 0737) Pulse Rate:  [39-72] 61 (01/23 0737) Cardiac Rhythm: Heart block;Normal sinus rhythm;Bundle branch block (01/23 0700) Resp:  [6-19] 15 (01/23 0737) BP: (106-162)/(55-88) 106/55 (01/23 0737) SpO2:  [96 %-100 %] 98 % (01/23 0830) Arterial Line BP: (154-196)/(52-92) 183/64 (01/22 1257) Weight:  [277 lb 1.9 oz (125.7 kg)] 277 lb 1.9 oz (125.7 kg) (01/22 1257)  Hemodynamic parameters for last 24 hours:    Intake/Output from previous day: 01/22 0701 - 01/23 0700 In: 1474.7 [P.O.:240; I.V.:1134.7; IV Piggyback:100] Out: 1310 [Urine:1150; Blood:50; Chest Tube:110] Intake/Output this shift: No intake/output data recorded.  General appearance: alert, cooperative and no distress Heart: regular rate and rhythm Lungs: basilar crackles Abdomen: nontender Extremities: + LE edema Wound: dressings CDI  Lab Results:  Recent Labs  12/27/16 0530  WBC 8.2  HGB 12.1*  HCT 36.6*  PLT 179   BMET:  Recent Labs  12/27/16 0530  NA 132*  K 4.0  CL 100*  CO2 22  GLUCOSE 135*  BUN 25*  CREATININE 1.66*  CALCIUM 8.0*    PT/INR: No results for input(s): LABPROT, INR in the last 72 hours. ABG    Component Value Date/Time   PHART 7.362 12/27/2016 0334   HCO3 25.1 12/27/2016 0334   TCO2 31 11/09/2016 1458   ACIDBASEDEF TEST WILL BE CREDITED 12/19/2016 1333   O2SAT 95.7 12/27/2016 0334   CBG (last 3)   Recent Labs  12/27/16 0525 12/27/16 0632 12/27/16 0733  GLUCAP 135* 130* 135*    Meds Scheduled Meds: . acetaminophen  1,000 mg Oral Q6H   Or  . acetaminophen (TYLENOL) oral liquid 160 mg/5 mL   1,000 mg Oral Q6H  . albuterol  2.5 mg Nebulization Q4H while awake  . aspirin EC  81 mg Oral Daily  . bisacodyl  10 mg Oral Daily  . diclofenac  75 mg Oral BID  . fentaNYL   Intravenous Q4H  . insulin regular  0-10 Units Intravenous TID WC  . lisinopril  20 mg Oral Daily  . pantoprazole  40 mg Oral Daily  . potassium chloride SA  20 mEq Oral Daily  . senna-docusate  1 tablet Oral QHS  . triamterene-hydrochlorothiazide  1 tablet Oral Daily   Continuous Infusions: . sodium chloride    . bupivacaine ON-Q pain pump    . dextrose 5 % and 0.45% NaCl 10 mL/hr at 12/26/16 1624  . insulin (NOVOLIN-R) infusion 1.8 Units/hr (12/27/16 0634)   PRN Meds:.albuterol, dextrose, diazepam, diphenhydrAMINE **OR** diphenhydrAMINE, naloxone **AND** sodium chloride flush, ondansetron (ZOFRAN) IV, ondansetron (ZOFRAN) IV, potassium chloride (KCL MULTIRUN) 30 mEq in 265 mL IVPB, traMADol  Xrays Dg Chest Port 1 View  Result Date: 12/27/2016 CLINICAL DATA:  Status post left lung biopsy via thoracoscopy yesterday EXAM: PORTABLE CHEST 1 VIEW COMPARISON:  Portable chest x-ray of December 26, 2016 FINDINGS: The lungs are hypoinflated. There is no pneumothorax nor large pleural effusion. The left-sided chest tube is in stable position with the tip projecting over the posterior and medial aspect of the left fourth rib.  There is no mediastinal shift. The cardiac silhouette is enlarged and the pulmonary vascularity indistinct. The right internal jugular venous catheter tip projects over the medial aspect of the left brachiocephalic vein. IMPRESSION: Bilateral hypoinflation. Pulmonary vascular congestion. No pneumothorax nor large pleural effusion. Electronically Signed   By: David  Martinique M.D.   On: 12/27/2016 07:37   Dg Chest Port 1 View  Result Date: 12/26/2016 CLINICAL DATA:  Lung surgery.  Chest tube . EXAM: PORTABLE CHEST 1 VIEW COMPARISON:  12/19/2016. FINDINGS: Left chest tube, left IJ line in good anatomic  position. Cardiomegaly again noted. Bilateral interstitial prominence again noted. Interstitial prominence is increased from prior exam is consistent with interstitial edema and/or pneumonitis. Small left pleural effusion. No pneumothorax. Prior cervical spine fusion. IMPRESSION: 1. Left chest tube and left IJ line in good anatomic position. No pneumothorax. 2. Cardiomegaly with bilateral diffuse interstitial prominence. Findings consistent congestive heart failure interstitial edema. Interstitial pneumonitis cannot be excluded. Underlying chronic interstitial lung disease most likely present. Electronically Signed   By: Marcello Moores  Register   On: 12/26/2016 11:00    Assessment/Plan: S/P Procedure(s) (LRB): VIDEO BRONCHOSCOPY (N/A) VIDEO ASSISTED THORACOSCOPY (Left) LUNG BIOPSY (Left)  1 stable, rhythm management as noted- beta blocker on hold for now 2 some vascular congestion- IV- KVO, give lasix this am 3 d/c aline 4 CT no air leak, minor drainage- H2O seal 5 adjust DM regimine   LOS: 1 day    GOLD,WAYNE E 12/27/2016 Patient seen and examined, agree with above  Remo Lipps C. Roxan Hockey, MD [facility] [phone]

## 2016-12-27 NOTE — Care Management Note (Signed)
Case Management Note  Patient Details  Name: Shane [name] MRN: [id number] Date of Birth: Apr 15, 1941  Subjective/Objective:   S/p VATS, lung bx, conts with chest tube to water seal, conts on pca, he is on insulin drip, but he has insulin pump at home, he will transition to the pump later today.  NCM will cont to follow for dc needs.               Action/Plan:   Expected Discharge Date:  12/29/16               Expected Discharge Plan:  [place]  In-House Referral:     Discharge planning Services  CM Consult  Post Acute Care Choice:    Choice offered to:     DME Arranged:    DME Agency:     HH Arranged:    HH Agency:     Status of Service:  In process, will continue to follow  If discussed at Long Length of Stay Meetings, dates discussed:    Additional Comments:  Zenon Mayo, RN 12/27/2016, 6:46 PM

## 2016-12-27 NOTE — Progress Notes (Signed)
Progress Note  Patient Name: Shane [name] Date of Encounter: 12/27/2016  Primary Cardiologist: [name]  Subjective   Minimal incisional soreness after thoroscopy  Inpatient Medications    Scheduled Meds: . acetaminophen  1,000 mg Oral Q6H   Or  . acetaminophen (TYLENOL) oral liquid 160 mg/5 mL  1,000 mg Oral Q6H  . albuterol  2.5 mg Nebulization Q4H while awake  . aspirin EC  81 mg Oral Daily  . bisacodyl  10 mg Oral Daily  . diclofenac  75 mg Oral BID  . fentaNYL   Intravenous Q4H  . insulin regular  0-10 Units Intravenous TID WC  . lisinopril  20 mg Oral Daily  . pantoprazole  40 mg Oral Daily  . potassium chloride SA  20 mEq Oral Daily  . senna-docusate  1 tablet Oral QHS  . triamterene-hydrochlorothiazide  1 tablet Oral Daily   Continuous Infusions: . sodium chloride    . bupivacaine ON-Q pain pump    . dextrose 5 % and 0.45% NaCl 10 mL/hr at 12/26/16 1624  . insulin (NOVOLIN-R) infusion 1.8 Units/hr (12/27/16 0634)   PRN Meds: albuterol, dextrose, diazepam, diphenhydrAMINE **OR** diphenhydrAMINE, naloxone **AND** sodium chloride flush, ondansetron (ZOFRAN) IV, ondansetron (ZOFRAN) IV, potassium chloride (KCL MULTIRUN) 30 mEq in 265 mL IVPB, traMADol   Vital Signs    Vitals:   12/27/16 0401 12/27/16 0621 12/27/16 0737 12/27/16 0830  BP:   (!) 106/55   Pulse:   61   Resp:  15 15   Temp: 98.2 F (36.8 C)  98.4 F (36.9 C)   TempSrc: Axillary  Oral   SpO2:   96% 98%  Weight:      Height:        Intake/Output Summary (Last 24 hours) at 12/27/16 0834 Last data filed at 12/27/16 0700  Gross per 24 hour  Intake          1474.67 ml  Output             1310 ml  Net           164.67 ml   Filed Weights   12/26/16 1257  Weight: 277 lb 1.9 oz (125.7 kg)    Telemetry    nsr with occaisional AVWB - Personally Reviewed  ECG    NSR with AVWB - Personally Reviewed  Physical Exam   GEN: No acute distress.  Neck: No JVD Cardiac: RRR, no murmurs,  rubs, or gallops.  Respiratory: Clear to auscultation bilaterally. GI: Soft, nontender, non-distended  MS: No edema; No deformity. Neuro:  AAOx3. Psych: Normal affect  Labs    Chemistry Recent Labs Lab 12/27/16 0530  NA 132*  K 4.0  CL 100*  CO2 22  GLUCOSE 135*  BUN 25*  CREATININE 1.66*  CALCIUM 8.0*  GFRNONAA 39*  GFRAA 45*  ANIONGAP 10     Hematology Recent Labs Lab 12/27/16 0530  WBC 8.2  RBC 4.01*  HGB 12.1*  HCT 36.6*  MCV 91.3  MCH 30.2  MCHC 33.1  RDW 13.9  PLT 179    Cardiac EnzymesNo results for input(s): TROPONINI in the last 168 hours. No results for input(s): TROPIPOC in the last 168 hours.   BNPNo results for input(s): BNP, PROBNP in the last 168 hours.   DDimer No results for input(s): DDIMER in the last 168 hours.   Radiology    Dg Chest Port 1 View  Result Date: 12/27/2016 CLINICAL DATA:  Status post left lung biopsy  via thoracoscopy yesterday EXAM: PORTABLE CHEST 1 VIEW COMPARISON:  Portable chest x-ray of December 26, 2016 FINDINGS: The lungs are hypoinflated. There is no pneumothorax nor large pleural effusion. The left-sided chest tube is in stable position with the tip projecting over the posterior and medial aspect of the left fourth rib. There is no mediastinal shift. The cardiac silhouette is enlarged and the pulmonary vascularity indistinct. The right internal jugular venous catheter tip projects over the medial aspect of the left brachiocephalic vein. IMPRESSION: Bilateral hypoinflation. Pulmonary vascular congestion. No pneumothorax nor large pleural effusion. Electronically Signed   By: David  Martinique M.D.   On: 12/27/2016 07:37   Dg Chest Port 1 View  Result Date: 12/26/2016 CLINICAL DATA:  Lung surgery.  Chest tube . EXAM: PORTABLE CHEST 1 VIEW COMPARISON:  12/19/2016. FINDINGS: Left chest tube, left IJ line in good anatomic position. Cardiomegaly again noted. Bilateral interstitial prominence again noted. Interstitial prominence is  increased from prior exam is consistent with interstitial edema and/or pneumonitis. Small left pleural effusion. No pneumothorax. Prior cervical spine fusion. IMPRESSION: 1. Left chest tube and left IJ line in good anatomic position. No pneumothorax. 2. Cardiomegaly with bilateral diffuse interstitial prominence. Findings consistent congestive heart failure interstitial edema. Interstitial pneumonitis cannot be excluded. Underlying chronic interstitial lung disease most likely present. Electronically Signed   By: Marcello Moores  Register   On: 12/26/2016 11:00    Cardiac Studies   none  Patient Profile     76 y.o. male with video assisted thorascopy, and biopsy, who developed bradycardia post op, beta blocker held, now with NSR and occaisional AVWB  Assessment & Plan    1. Bradycardia - does not appear to be symptomatic. Avoid all beta blocker or AV nodal blocking drugs. 2. HTN - his blood pressure is controlled. If he develops elevated blood pressures as an outpatient, could try amlodipine. 3. Interstitial lung disease - s/p biopsy, pathology pending. 4. Disp. - will sign off but be available as needed.   [name] Taylor,M.D.  Signed, Cristopher Peru, MD  12/27/2016, 8:34 AM  Patient ID: Shane [name], male   DOB: 1941/01/26, 76 y.o.   MRN: [id number]

## 2016-12-27 NOTE — Progress Notes (Signed)
Patient on CPAP and is tolerating well at this time.

## 2016-12-27 NOTE — Op Note (Signed)
NAMEMARQUIST, BINSTOCK NO.:  [id number]  MEDICAL RECORD NO.:  [id number]  LOCATION:  [facility]                         FACILITY:  [facility]  PHYSICIAN:  Revonda Standard. Roxan Hockey, M.D.DATE OF BIRTH:  07/15/1941  DATE OF PROCEDURE:  12/26/2016 DATE OF DISCHARGE:                              OPERATIVE REPORT   PREOPERATIVE DIAGNOSIS:  Interstitial lung disease.  POSTOPERATIVE DIAGNOSIS:  Interstitial lung disease.  PROCEDURE: 1. Bronchoscopy with bronchoalveolar lavage and transbronchial     biopsies. 2. Left video-assisted thoracoscopy with lung biopsy x3. 3. On-Q local anesthetic catheter placement.  SURGEON:  Revonda Standard. Roxan Hockey, M.D.  ASSISTANT:  Ellwood Handler, PA.  ANESTHESIA:  General.  FINDINGS:  Bronchoscopy, normal endobronchial anatomy with no endobronchial lesions.  Lung diffusely nodular and grayish discoloration.  CLINICAL NOTE:  Mr. Pidcock is a 76 year old gentleman, who has been followed for interstitial lung disease.  He recently has had progression of symptoms and a CT chest showed progression of his ILD.  He was sent for lung biopsy and consented to participate in the BRAVE study.  The indications, risks, benefits, and alternatives were discussed in detail with the patient.  He understood and accepted the risks and agreed to proceed.  He did understand this was a diagnostic and not a therapeutic procedure.  OPERATIVE NOTE:  Mr. Jani was brought to the preop holding area on December 26, 2016. A central line and arterial blood pressure monitoring lines were placed.  He was taken to the operating room, anesthetized, and intubated.  A Foley catheter was placed.  Sequential compression devices were placed on the calves for DVT prophylaxis.  Flexible fiberoptic bronchoscopy was performed via the endotracheal tube.  It revealed normal endobronchial anatomy and no endobronchial lesions to the level of the subsegmental bronchi.  There were minimal  secretions. Bronchoalveolar lavage then was performed with 60 mL of saline instilled into both the upper lobe and lower lobe.  These were sent as separate specimens.  Transbronchial biopsies then were performed with fluoroscopy.  Three biopsies were performed under fluoroscopy from the lower lobe and 2 from the upper lobe.  These were sent as separate specimens as well. The total fluoroscopy time was 0.6 minutes and dose was 13 mGy.  The patient then was reintubated with a double-lumen endotracheal tube. He was placed in a right lateral decubitus position and the left chest was prepped and draped in the usual sterile fashion.  Single lung ventilation of the right lung was initiated and was tolerated well throughout the procedure.  An incision was made in approximately the seventh intercostal space in the midaxillary line.  A 5-mm port was inserted into the chest.  The 5- mm thoracoscope was advanced through the port.  There was good visualization of the lung.  There was good isolation of the lung, though initially there was some air trapping.  Suction was applied and that helped with the air trapping.  An incision was made in the lateral chest wall in approximately the fourth inner space, no rib spreading was performed during the procedure.  The lingula was grasped.  The entire lung had a grayish discoloration and a nodular texture.  The lingula was biopsied with sequential firings of an Echelon 45 mm staple with gold cartridges.  It was sent for frozen section to ensure that there was interstitial lung disease present.  While awaiting those results, a biopsy was taken along the inferior margin of the lower lobe again using the Echelon stapler and finally, a second biopsy was taken from the upper lobe along the posterior aspect along the major fissure.  The second 2 biopsies were sent for permanent pathology.  A small piece was sent for AFB and fungal cultures as well.  The frozen  section returned showing interstitial lung disease.  An On-Q local anesthetic catheter was tunneled through a separate posterior incision and tunneled into a subpleural location.  It was primed with 5 mL of 0.5% Marcaine and secured to the skin with a 3-0 silk suture.  A 28-French chest tube was placed through the original port incision and directed to the apex.  Dual lung ventilation was resumed.  The working incision was closed with a #1 Vicryl fascial suture, 2-0 Vicryl subcutaneous suture, and 3-0 Vicryl subcuticular suture.  All sponge, needle, and instrument counts were correct at the end of the procedure.  The patient was taken from the operating room to the postanesthetic care unit extubated and in good condition.     Revonda Standard Roxan Hockey, M.D.     SCH/MEDQ  D:  12/26/2016  T:  12/27/2016  Job:  [id number]

## 2016-12-28 ENCOUNTER — Telehealth: Payer: Self-pay | Admitting: Internal Medicine

## 2016-12-28 ENCOUNTER — Inpatient Hospital Stay (HOSPITAL_COMMUNITY): Payer: Medicare Other

## 2016-12-28 LAB — CBC
HCT: 34.6 % — ABNORMAL LOW (ref 39.0–52.0)
Hemoglobin: 11.4 g/dL — ABNORMAL LOW (ref 13.0–17.0)
MCH: 30.2 pg (ref 26.0–34.0)
MCHC: 32.9 g/dL (ref 30.0–36.0)
MCV: 91.8 fL (ref 78.0–100.0)
Platelets: 173 10*3/uL (ref 150–400)
RBC: 3.77 MIL/uL — ABNORMAL LOW (ref 4.22–5.81)
RDW: 13.7 % (ref 11.5–15.5)
WBC: 7.4 10*3/uL (ref 4.0–10.5)

## 2016-12-28 LAB — COMPREHENSIVE METABOLIC PANEL
ALT: 19 U/L (ref 17–63)
AST: 20 U/L (ref 15–41)
Albumin: 2.7 g/dL — ABNORMAL LOW (ref 3.5–5.0)
Alkaline Phosphatase: 59 U/L (ref 38–126)
Anion gap: 9 (ref 5–15)
BUN: 24 mg/dL — ABNORMAL HIGH (ref 6–20)
CO2: 26 mmol/L (ref 22–32)
Calcium: 8.3 mg/dL — ABNORMAL LOW (ref 8.9–10.3)
Chloride: 102 mmol/L (ref 101–111)
Creatinine, Ser: 1.59 mg/dL — ABNORMAL HIGH (ref 0.61–1.24)
GFR calc Af Amer: 47 mL/min — ABNORMAL LOW (ref >60)
GFR calc non Af Amer: 41 mL/min — ABNORMAL LOW (ref >60)
Glucose, Bld: 141 mg/dL — ABNORMAL HIGH (ref 65–99)
Potassium: 4.5 mmol/L (ref 3.5–5.1)
Sodium: 137 mmol/L (ref 135–145)
Total Bilirubin: 0.8 mg/dL (ref 0.3–1.2)
Total Protein: 6.2 g/dL — ABNORMAL LOW (ref 6.5–8.1)

## 2016-12-28 LAB — GLUCOSE, CAPILLARY
Glucose-Capillary: 112 mg/dL — ABNORMAL HIGH (ref 65–99)
Glucose-Capillary: 116 mg/dL — ABNORMAL HIGH (ref 65–99)
Glucose-Capillary: 117 mg/dL — ABNORMAL HIGH (ref 65–99)
Glucose-Capillary: 119 mg/dL — ABNORMAL HIGH (ref 65–99)
Glucose-Capillary: 129 mg/dL — ABNORMAL HIGH (ref 65–99)
Glucose-Capillary: 137 mg/dL — ABNORMAL HIGH (ref 65–99)
Glucose-Capillary: 151 mg/dL — ABNORMAL HIGH (ref 65–99)
Glucose-Capillary: 151 mg/dL — ABNORMAL HIGH (ref 65–99)
Glucose-Capillary: 158 mg/dL — ABNORMAL HIGH (ref 65–99)
Glucose-Capillary: 182 mg/dL — ABNORMAL HIGH (ref 65–99)
Glucose-Capillary: 233 mg/dL — ABNORMAL HIGH (ref 65–99)
Glucose-Capillary: 238 mg/dL — ABNORMAL HIGH (ref 65–99)
Glucose-Capillary: 243 mg/dL — ABNORMAL HIGH (ref 65–99)
Glucose-Capillary: 270 mg/dL — ABNORMAL HIGH (ref 65–99)

## 2016-12-28 MED ORDER — INSULIN DETEMIR 100 UNIT/ML ~~LOC~~ SOLN
15.0000 [IU] | Freq: Two times a day (BID) | SUBCUTANEOUS | Status: DC
  Administered 2016-12-28 (×2): 15 [IU] via SUBCUTANEOUS
  Filled 2016-12-28 (×3): qty 0.15

## 2016-12-28 MED ORDER — ENOXAPARIN SODIUM 40 MG/0.4ML ~~LOC~~ SOLN
40.0000 mg | SUBCUTANEOUS | Status: DC
  Administered 2016-12-28: 40 mg via SUBCUTANEOUS
  Filled 2016-12-28: qty 0.4

## 2016-12-28 NOTE — Progress Notes (Signed)
Chest tube removed without any difficulty by Loree Fee, RN (4E). Pt tolerated well. Pressure dressing applied. Pt instructed to call with any sob and/or drainage from the sight. Will monitor.

## 2016-12-28 NOTE — Progress Notes (Signed)
Pt on CPAP and tolerating well.

## 2016-12-28 NOTE — Discharge Summary (Signed)
Physician Discharge Summary  Patient ID: Shane [name] MRN: [id number] DOB/AGE: 02-28-1941 76 y.o.  Admit date: 12/26/2016 Discharge date: 12/29/2016  Admission Diagnoses:  Patient Active Problem List   Diagnosis Date Noted  . ILD (interstitial lung disease) ([place]) 12/26/2016  . Lung disease, interstitial ([place])   . Abnormal chest x-ray 10/12/2016  . Chronic cough 10/12/2016  . Bibasilar crackles 10/12/2016  . Dyspnea and respiratory abnormality 10/10/2016  . 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 ILD (interstitial lung disease) ([place]) 12/26/2016  . Lung disease, interstitial ([place])   . Abnormal chest x-ray 10/12/2016  . Chronic cough 10/12/2016  . Bibasilar crackles 10/12/2016  . Dyspnea and respiratory abnormality 10/10/2016  . 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  Shane [name] has been experiencing shortness of breath with exertion for the past several months.  Shane [name] noticed it had gotten significantly worse over the past 3-4 weeks.  Shane [name] was also experiencing a dry cough which at times was severe.  Shane [name] was evaluated by Dr. Chase Caller who was concerned for possible ILD.  CT scan was obtained and showed ILD.  Shane [name] was referred to [facility] for lung biopsy.  Shane [name] was evaluated by Dr. Roxan Hockey who was in agreement to perform a lung biopsy for diagnostic purpose.  The risks and benefits of the procedure were explained to the patient and Shane [name] was agreeable to proceed.  Cardiology clearance was obtained prior to surgery.  Hospital Course:   Shane [name] presented to [facility] on 12/26/2016.  Shane [name] was taken to the operating room and underwent Left VATS with lung biopsy of LUL and LLL.  Shane [name]  tolerated the procedure without difficulty, was extubated and taken to the PACU in stable condition. The patient developed bradycardia and after review of EKG Shane [name] was found to have Intermittent Type 1 second degree AVB.  His beta blocker was discontinued and Cardiology consult was obtained.  They were in agreement with cessation of BB of further monitoring.  The patient's CXR showed some vascular congestion.  Shane [name] was taken off his IV fluids and given a dose of IV Lasix.  There was no evidence of air leak from his chest tube and Shane [name] was transitioned to water seal.  Follow up CXR remained stable without evidence of pneumothorax.  His chest tube was removed without difficulty on POD #2.  Follow up CXR again remained stable.  The patient continued to maintain NSR with some second degree heart block.  We will continue to hold his BB and Shane [name] will follow up with Cardiology after discharge.  Shane [name] has been weaned off oxygen without difficulty.  His pain is well controlled.  Shane [name] is medically stable for discharge today.      Consults: cardiology  Significant Diagnostic Studies: radiology:   CT scan:   1. Fibrotic interstitial lung disease characterized by patchy subpleural and peribronchovascular reticulation and ground-glass attenuation, mild traction bronchiectasis and mild architectural distortion, with no clear basilar gradient and no frank honeycombing. Mild progression of disease since 12/05/2008 chest CT. Moderate patchy air trapping in both lungs. 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 Nonspecific interstitial pneumonia (NSIP) is a consideration. Findings are not consistent with usual  interstitial pneumonia (UIP). 2. Stable mild mediastinal lymphadenopathy, consistent with benign reactive etiology. 3. Aortic atherosclerosis. Left main and 3 vessel coronary atherosclerosis. 4. Stable mild cardiomegaly. 5. Worsening main pulmonary artery dilation, suggesting  worsening pulmonary arterial hypertension.  Treatments: surgery:   1. Bronchoscopy with bronchoalveolar lavage and transbronchial     biopsies. 2. Left video-assisted thoracoscopy with lung biopsy x3. 3. On-Q local anesthetic catheter placement.  Disposition: 01-Home or Self Care   Discharge Medications:     Allergies as of 12/29/2016      Reactions   Codeine Hives, Itching      Medication List    STOP taking these medications   nebivolol 10 MG tablet Commonly known as:  BYSTOLIC     TAKE these medications   acetaminophen 500 MG tablet Commonly known as:  TYLENOL Take 500 mg by mouth every 6 (six) hours as needed for moderate pain.   aspirin 81 MG tablet Take 81 mg by mouth daily.   diazepam 5 MG tablet Commonly known as:  VALIUM Take 5 mg by mouth at bedtime as needed (for sleep).   diclofenac 75 MG EC tablet Commonly known as:  VOLTAREN Take 75 mg by mouth 2 (two) times daily.   FARXIGA 5 MG Tabs tablet Generic drug:  dapagliflozin propanediol Take 5 mg by mouth daily.   furosemide 40 MG tablet Commonly known as:  LASIX Take 40 mg by mouth daily.   HUMALOG KWIKPEN 100 UNIT/ML injection Generic drug:  insulin lispro Inject 20 Units into the skin 3 (three) times daily before meals. Sliding scale   insulin lispro 100 UNIT/ML injection Commonly known as:  HUMALOG Inject 76 Units into the skin continuous. Use over continuous rate in v-go bag over 24 hours   HYDROmorphone 2 MG tablet Commonly known as:  DILAUDID Take 1 tablet (2 mg total) by mouth every 4 (four) hours as needed for severe pain.   insulin detemir 100 UNIT/ML injection Commonly known as:  LEVEMIR Inject 22 Units into the skin at bedtime.   KLOR-CON M20 20 MEQ tablet Generic drug:  potassium chloride SA Take 20 mEq by mouth daily.   lisinopril 20 MG tablet Commonly known as:  PRINIVIL,ZESTRIL Take 20 mg by mouth daily.   multivitamin tablet Take 1 tablet by mouth daily.    nitroGLYCERIN 0.4 MG SL tablet Commonly known as:  NITROSTAT Place 0.4 mg under the tongue every 5 (five) minutes as needed. For chest pain   pantoprazole 40 MG tablet Commonly known as:  PROTONIX Take 40 mg by mouth daily.   PROAIR HFA 108 (90 Base) MCG/ACT inhaler Generic drug:  albuterol Inhale 2 puffs into the lungs every 4 (four) hours as needed for wheezing or shortness of breath.   PROMETHAZINE VC PLAIN 6.25-5 MG/5ML Syrp Generic drug:  promethazine-phenylephrine Take 5 mLs by mouth every 4 (four) hours as needed for congestion.   triamterene-hydrochlorothiazide 37.5-25 MG tablet Commonly known as:  MAXZIDE-25 Take 1 tablet by mouth daily.   V-GO 40 Kit 76 Units by Does not apply route daily. humalog 100 units dispensed every 24 hours. Dispose v-go bag after 24 hours of use.   vitamin C 1000 MG tablet Take 1,000 mg by mouth daily.      Follow-up Information    [name], MD Follow up on 01/17/2017.   Specialty:  Cardiothoracic Surgery Why:  Appointment is at 12:30    Please get CXR at 12:00   at [facility] located on first  floor of our office building Contact information: [address] [place] [address] [phone]           Signed: Ellwood Handler 12/29/2016, 8:53 AM

## 2016-12-28 NOTE — Progress Notes (Signed)
2 Days Post-Op Procedure(s) (LRB): VIDEO BRONCHOSCOPY (N/A) VIDEO ASSISTED THORACOSCOPY (Left) LUNG BIOPSY (Left) Subjective: Some discomfort from CT  Objective: Vital signs in last 24 hours: Temp:  [97.4 F (36.3 C)-98.8 F (37.1 C)] 98.6 F (37 C) (01/24 0730) Pulse Rate:  [30-98] 62 (01/24 0730) Cardiac Rhythm: Heart block;Bundle branch block (01/24 0702) Resp:  [12-23] 12 (01/24 0730) BP: (104-147)/(59-90) 111/72 (01/24 0730) SpO2:  [95 %-100 %] 98 % (01/24 0730)  Hemodynamic parameters for last 24 hours:    Intake/Output from previous day: 01/23 0701 - 01/24 0700 In: 240 [P.O.:240] Out: 750 [Urine:750] Intake/Output this shift: No intake/output data recorded.  General appearance: alert, cooperative and no distress Neurologic: intact Heart: regular rate and rhythm Lungs: diminished breath sounds bibasilar Abdomen: normal findings: soft, non-tender no air leak  Lab Results:  Recent Labs  12/27/16 0530 12/28/16 0530  WBC 8.2 7.4  HGB 12.1* 11.4*  HCT 36.6* 34.6*  PLT 179 173   BMET:  Recent Labs  12/27/16 0530 12/28/16 0530  NA 132* 137  K 4.0 4.5  CL 100* 102  CO2 22 26  GLUCOSE 135* 141*  BUN 25* 24*  CREATININE 1.66* 1.59*  CALCIUM 8.0* 8.3*    PT/INR: No results for input(s): LABPROT, INR in the last 72 hours. ABG    Component Value Date/Time   PHART 7.362 12/27/2016 0334   HCO3 25.1 12/27/2016 0334   TCO2 31 11/09/2016 1458   ACIDBASEDEF TEST WILL BE CREDITED 12/19/2016 1333   O2SAT 95.7 12/27/2016 0334   CBG (last 3)   Recent Labs  12/28/16 0521 12/28/16 0626 12/28/16 0728  GLUCAP 151* 151* 158*    Assessment/Plan: S/P Procedure(s) (LRB): VIDEO BRONCHOSCOPY (N/A) VIDEO ASSISTED THORACOSCOPY (Left) LUNG BIOPSY (Left) -CV- still in SR with some second degree block, no profound or symptomatic brady  No beta blocker  RESP- ILD- on 2 L  with good sats  No air leak- if CXR OK this AM will dc CT  RENAL- creatinine 1.6  baseline is 1.8  ENDO- CBG reasonably controlled with insulin pump  SCD + enoxaparin for DVT prophlaxis   LOS: 2 days    [name] 12/28/2016

## 2016-12-28 NOTE — Progress Notes (Signed)
Jadene Pierini PA paged to clarify whether or not to start sliding scale meal correction as discussed with diabetes educator. Verbal order given for cbgs only; no sliding scale coverage. Will monitor pt for hyperglycemia and address accordingly.

## 2016-12-28 NOTE — Telephone Encounter (Signed)
On 26 Dec 2016 at approximately 1630 I received a call from [place], Microbiology Lab Manage at [facility]. Merry Proud stated that he has 2 STAT orders for a culture and gram stain for a specimen from a BAL. He advised he contacted the OR and was advised that research had the specimens. I advised him I would contact the coordinator that was involved and I will call him back.  I spoke with Joaquim Lai, study coordinator for the [facility]- 1 study, because she conducted a case for this study on 22Jan2018. She advised that there should not be any orders placed for the Micro Lab to perform because all of the specimen and tests are performed with the research lab. She confirmed that these orders can be cancelled.  I spoke with Merry Proud at [facility] and advised him that the orders were placed in error and can be cancelled, no testing is needed from the micro lab. Merry Proud stated understanding.   Gumlog Bing, [place], Research Assistant

## 2016-12-28 NOTE — Discharge Instructions (Signed)
Video-Assisted Thoracic Surgery, Care After Refer to this sheet in the next few weeks. These instructions provide you with information on caring for yourself after your procedure. Your caregiver may also give you more specific instructions. Your procedure has been planned according to current medical practices, but problems sometimes occur. Call your caregiver if you have any problems or questions after your procedure. HOME CARE INSTRUCTIONS   Only take over-the-counter or prescription medications as directed.  Only take pain medications (narcotics) as directed.  Do not drive until your caregiver approves. Driving while taking narcotics or soon after surgery can be dangerous, so discuss the specific timing with your caregiver.  Avoid activities that use your chest muscles, such as lifting heavy objects, for at least 3-4 weeks.   Take deep breaths to expand the lungs and to protect against pneumonia.  Do breathing exercises as directed by your caregiver. If you were given an incentive spirometer to help with breathing, use it as directed.  You may resume a normal diet and activities when you feel you are able to or as directed.  Do not take a bath until your caregiver says it is OK. Use the shower instead.   Keep the bandage (dressing) covering the area where the chest tube was inserted (incision site) dry for 48 hours. After 48 hours, remove the dressing unless there is new drainage.  Remove dressings as directed by your caregiver.  Change dressings if necessary or as directed.  Keep all follow-up appointments. It is important for you to see your caregiver after surgery to discuss appropriate follow-up care and surveillance, if it is necessary. SEEK MEDICAL CARE:  You feel excessive or increasing pain at an incision site.  You notice bleeding, skin irritation, drainage, swelling, or redness at an incision site.  There is a bad smell coming from an incision or dressing.  It feels  like your heart is fluttering or beating rapidly.  Your pain medication does not relieve your pain. SEEK IMMEDIATE MEDICAL CARE IF:   You have a fever.   You have chest pain.  You have a rash.  You have shortness of breath.  You have trouble breathing.   You feel weak, lightheaded, dizzy, or faint.  MAKE SURE YOU:   Understand these instructions.   Will watch your condition.   Will get help right away if you are not doing well or get worse. This information is not intended to replace advice given to you by your health care provider. Make sure you discuss any questions you have with your health care provider. Document Released: 03/18/2013 Document Revised: 12/12/2014 Document Reviewed: 03/18/2013 Elsevier Interactive Patient Education  2017 [employer].

## 2016-12-28 NOTE — Progress Notes (Addendum)
Inpatient Diabetes Program Recommendations  AACE/ADA: New Consensus Statement on Inpatient Glycemic Control (2015)  Target Ranges:  Prepandial:   less than 140 mg/dL      Peak postprandial:   less than 180 mg/dL (1-2 hours)      Critically ill patients:  140 - 180 mg/dL   Lab Results  Component Value Date   GLUCAP 270 (H) 12/28/2016   HGBA1C 7.9 (H) 12/19/2016    Inpatient Diabetes Program Recommendations:  Patient has been stable on IV Insulin drip.    Floor RN(Portia) and PA Jadene Pierini) contacted and plan of DM care discussed.    Plan for transition off IV insulin, please consider:     Levemir 15 units BID (approximately half home dose)     Sensitive correction scale Novolog 0-9 units TIDAC and 0-5 units QHS.      Meal coverage of Novolog 3 units TIDAC if patient eats > 50% of meal  This RN will continue to monitor.  Thank you,  Windy Carina, RN, MSN Diabetes Coordinator Inpatient Diabetes Program [phone] (Team Pager)

## 2016-12-29 ENCOUNTER — Inpatient Hospital Stay (HOSPITAL_COMMUNITY): Payer: Medicare Other

## 2016-12-29 LAB — GLUCOSE, CAPILLARY: Glucose-Capillary: 191 mg/dL — ABNORMAL HIGH (ref 65–99)

## 2016-12-29 MED ORDER — HYDROMORPHONE HCL 2 MG PO TABS: 2.0000 mg | ORAL_TABLET | ORAL | 0 refills | Status: DC | PRN

## 2016-12-29 NOTE — Progress Notes (Addendum)
      [address]       [place] [address]             [phone]      3 Days Post-Op Procedure(s) (LRB): VIDEO BRONCHOSCOPY (N/A) VIDEO ASSISTED THORACOSCOPY (Left) LUNG BIOPSY (Left)   Subjective:  No new complaints.  States pain is much better since CT removal.  + ambulation  + BM  Objective: Vital signs in last 24 hours: Temp:  [97.8 F (36.6 C)-98.6 F (37 C)] 97.8 F (36.6 C) (01/25 0745) Pulse Rate:  [51-73] 51 (01/25 0329) Cardiac Rhythm: Heart block;Sinus bradycardia (01/25 0329) Resp:  [13-20] 13 (01/25 0415) BP: (126-151)/(61-77) 143/65 (01/25 0745) SpO2:  [94 %-100 %] 99 % (01/25 0415)  Intake/Output from previous day: 01/24 0701 - 01/25 0700 In: 446 [I.V.:446] Out: 300 [Urine:300]  General appearance: alert, cooperative and no distress Heart: regular rate and rhythm Lungs: diminished breath sounds bibasilar Abdomen: soft, non-tender; bowel sounds normal; no masses,  no organomegaly Wound: clean and dry  Lab Results:  Recent Labs  12/27/16 0530 12/28/16 0530  WBC 8.2 7.4  HGB 12.1* 11.4*  HCT 36.6* 34.6*  PLT 179 173   BMET:  Recent Labs  12/27/16 0530 12/28/16 0530  NA 132* 137  K 4.0 4.5  CL 100* 102  CO2 22 26  GLUCOSE 135* 141*  BUN 25* 24*  CREATININE 1.66* 1.59*  CALCIUM 8.0* 8.3*    PT/INR: No results for input(s): LABPROT, INR in the last 72 hours. ABG    Component Value Date/Time   PHART 7.362 12/27/2016 0334   HCO3 25.1 12/27/2016 0334   TCO2 31 11/09/2016 1458   ACIDBASEDEF TEST WILL BE CREDITED 12/19/2016 1333   O2SAT 95.7 12/27/2016 0334   CBG (last 3)   Recent Labs  12/28/16 1604 12/28/16 2110 12/29/16 0743  GLUCAP 233* 243* 191*    Assessment/Plan: S/P Procedure(s) (LRB): VIDEO BRONCHOSCOPY (N/A) VIDEO ASSISTED THORACOSCOPY (Left) LUNG BIOPSY (Left)  1. Chest tube removed yesterday- F/U CXR is free from pneumothorax, no change in interstitial lung changes, opacities 2. Pulm- ILD,  off oxygen, continue IS 3. DM- insulin pump per patient 4. Pain control- patient unable to tolerate Oxy, Tramadol, Percocet.. Can use Dilaudid without difficulty, will give 20 tablets for severe pain at discharge 4. Dispo- patient stable, will d/c home today   LOS: 3 days    BARRETT, ERIN 12/29/2016   Patient seen and examined, agree with above Home today  [place]. Roxan Hockey, MD [facility] [phone]

## 2016-12-29 NOTE — Progress Notes (Addendum)
Wasted 23m of Fentanyl PCA as per order. [facility]

## 2016-12-29 NOTE — Care Management Note (Signed)
Case Management Note  Patient Details  Name: LARAY CORBIT MRN: [id number] Date of Birth: 08-Jun-1941  Subjective/Objective:    S/p Vats , lung bx ,for dc today, no needs.                Action/Plan:   Expected Discharge Date:  12/29/16               Expected Discharge Plan:  Home/Self Care  In-House Referral:     Discharge planning Services  CM Consult  Post Acute Care Choice:    Choice offered to:     DME Arranged:    DME Agency:     HH Arranged:    HH Agency:     Status of Service:  Completed, signed off  If discussed at [employer] of Stay Meetings, dates discussed:    Additional Comments:  Zenon Mayo, RN 12/29/2016, 10:52 AM

## 2016-12-29 NOTE — Progress Notes (Addendum)
Inpatient Diabetes Program Recommendations  AACE/ADA: New Consensus Statement on Inpatient Glycemic Control (2015)  Target Ranges:  Prepandial:   less than 140 mg/dL      Peak postprandial:   less than 180 mg/dL (1-2 hours)      Critically ill patients:  140 - 180 mg/dL   Lab Results  Component Value Date   GLUCAP 191 (H) 12/29/2016   HGBA1C 7.9 (H) 12/19/2016   Results for CORNELIS, KLUVER (MRN [id number]) as of 12/29/2016 08:07  Ref. Range 12/28/2016 10:06 12/28/2016 11:40 12/28/2016 16:04 12/28/2016 21:10 12/29/2016 07:43  Glucose-Capillary Latest Ref Range: 65 - 99 mg/dL 270 (H) 238 (H) 233 (H) 243 (H) 191 (H)   MD, please consider:      Sensitive correction scale Novolog 0-9 units TIDAC and 0-5 units QHS;     Meal coverage of Novolog 3 units TIDAC if patient eats > 50% of meal.  Thank you,  Windy Carina, RN, MSN Diabetes Coordinator Inpatient Diabetes Program [phone] (Team Pager)

## 2016-12-30 ENCOUNTER — Encounter (HOSPITAL_COMMUNITY): Payer: Self-pay

## 2017-01-02 LAB — BLOOD GAS, ARTERIAL
Acid-Base Excess: 0.4 mmol/L (ref 0.0–2.0)
Bicarbonate: 25.1 mmol/L (ref 20.0–28.0)
Drawn by: 419771
O2 Content: 2 L/min
O2 Saturation: 95.7 %
Patient temperature: 98.6
pCO2 arterial: 45.4 mmHg (ref 32.0–48.0)
pH, Arterial: 7.362 (ref 7.350–7.450)
pO2, Arterial: 115 mmHg — ABNORMAL HIGH (ref 83.0–108.0)

## 2017-01-03 NOTE — Progress Notes (Signed)
HPI: FU dyspnea. 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 Evaluation is in progress. Seen by Rosaria Ferries for dyspnea on November 6. Cardiac catheterization in 2012 showed ejection fraction 45-50%. There was calcification noted in his vessels and they were smaller in diameter consistent with diabetic vascular disease. There was a 60% distal circumflex and 60 PDA. Echocardiogram November 2017 showed normal LV function, moderate left ventricular hypertrophy, grade 1 diastolic dysfunction. Chest CT November 2017 showed interstitial lung disease, coronary atherosclerosis and dilated pulmonary artery. Nuclear study November 2017 showed ejection fraction 60% and no ischemia or infarction. Right heart catheterization December 2017 showed normal pulmonary capillary wedge pressure and mild pulmonary hypertension with PA systolic pressure 36 mmHg. Patient had lung biopsy on January 22. Pathology consistent with usual interstitial pneumonia. He had bradycardia postoperatively and his beta blocker was discontinued. Since last seen, he does have dyspnea on exertion. No orthopnea, PND, pedal edema, chest pain or syncope.  Current Outpatient Prescriptions  Medication Sig Dispense Refill  . acetaminophen (TYLENOL) 500 MG tablet Take 500 mg by mouth every 6 (six) hours as needed for moderate pain.    . Ascorbic Acid (VITAMIN C) 1000 MG tablet Take 1,000 mg by mouth daily.    Marland Kitchen aspirin 81 MG tablet Take 81 mg by mouth daily.      . diazepam (VALIUM) 5 MG tablet Take 5 mg by mouth at bedtime as needed (for sleep).     . furosemide (LASIX) 40 MG tablet Take 40 mg by mouth daily.     . insulin detemir (LEVEMIR) 100 UNIT/ML injection Inject 22 Units into the skin at bedtime.    . insulin lispro (HUMALOG KWIKPEN) 100 UNIT/ML injection Inject 20 Units into the skin 3 (three) times daily before meals. Sliding scale    . insulin lispro (HUMALOG) 100 UNIT/ML injection Inject 76  Units into the skin continuous. Use over continuous rate in v-go bag over 24 hours     . Insulin Pump Disposable (V-GO 40) KIT 76 Units by Does not apply route daily. humalog 100 units dispensed every 24 hours. Dispose v-go bag after 24 hours of use.     Marland Kitchen KLOR-CON M20 20 MEQ tablet Take 20 mEq by mouth daily.     Marland Kitchen lisinopril (PRINIVIL,ZESTRIL) 20 MG tablet Take 20 mg by mouth daily.     . Multiple Vitamin (MULTIVITAMIN) tablet Take 1 tablet by mouth daily.      . nebivolol (BYSTOLIC) 5 MG tablet Take 5 mg by mouth daily.    . nitroGLYCERIN (NITROSTAT) 0.4 MG SL tablet Place 0.4 mg under the tongue every 5 (five) minutes as needed. For chest pain    . pantoprazole (PROTONIX) 40 MG tablet Take 40 mg by mouth daily.      Marland Kitchen PROAIR HFA 108 (90 Base) MCG/ACT inhaler Inhale 2 puffs into the lungs every 4 (four) hours as needed for wheezing or shortness of breath.     . promethazine-phenylephrine (PROMETHAZINE VC PLAIN) 6.25-5 MG/5ML SYRP Take 5 mLs by mouth every 4 (four) hours as needed for congestion.     No current facility-administered medications for this visit.      Past Medical History:  Diagnosis Date  . Cancer ([place])    ling  . Cataract   . Chest pain   . Chronic kidney disease    d/t DM  . Diabetes mellitus    Vgo disposal insulin bolus  simular to  insulin pump  . Dyspnea   . GERD (gastroesophageal reflux disease)   . Hyperlipidemia   . Hypertension   . Neuromuscular disorder ([place])   . PONV (postoperative nausea and vomiting)   . Sleep apnea     uses cpap asked to bring mask and tubing    Past Surgical History:  Procedure Laterality Date  . ANTERIOR FUSION CERVICAL SPINE  2012  . CARDIAC CATHETERIZATION  2011  . CARDIAC CATHETERIZATION N/A 11/09/2016   Procedure: Right Heart Cath;  Surgeon: Belva Crome, MD;  Location: [place] [facility];  Service: Cardiovascular;  Laterality: N/A;  . carpel tunnel     left wrist  . CATARACT EXTRACTION W/ INTRAOCULAR LENS  IMPLANT,  BILATERAL  2013  . CERVICAL LAMINECTOMY  2012  . COLONOSCOPY N/A 01/14/2013   Procedure: COLONOSCOPY;  Surgeon: Irene Shipper, MD;  [facility];  Service: Endoscopy;  Laterality: N/A;  . EYE SURGERY    . [place]   left  . LUMBAR LAMINECTOMY  2003  . LUNG BIOPSY Left 12/26/2016   Procedure: LUNG BIOPSY;  Surgeon: [name], MD;  Location: [place];  Service: Thoracic;  Laterality: Left;  . POSTERIOR FUSION CERVICAL SPINE  2012  . TRIGGER FINGER RELEASE  2011   4th finger left hand  . VIDEO ASSISTED THORACOSCOPY Left 12/26/2016   Procedure: VIDEO ASSISTED THORACOSCOPY;  Surgeon: [name], MD;  Location: [place];  Service: Thoracic;  Laterality: Left;  Marland Kitchen VIDEO BRONCHOSCOPY N/A 12/26/2016   Procedure: VIDEO BRONCHOSCOPY;  Surgeon: [name], MD;  Location: [place];  Service: Thoracic;  Laterality: N/A;    Social History   Social History  . Marital status: Married    Spouse name: N/A  . Number of children: N/A  . Years of education: N/A   Occupational History  . Not on file.   Social History Main Topics  . Smoking status: Never Smoker  . Smokeless tobacco: Never Used  . Alcohol use No  . Drug use: No  . Sexual activity: Not on file   Other Topics Concern  . 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     Family History  Problem Relation Age of Onset  . Diabetes Mellitus II Mother   . Emphysema Father 84  . Colon cancer Neg Hx   . Esophageal cancer Neg Hx   . Rectal cancer Neg Hx   . Stomach cancer Neg Hx     ROS: no fevers or chills, productive cough, hemoptysis, dysphasia, odynophagia, melena, hematochezia, dysuria, hematuria, rash, seizure activity, orthopnea, PND, pedal edema, claudication. Remaining systems are negative.  Physical Exam: Well-developed well-nourished in no acute distress.  Skin is warm and dry.  HEENT is normal.  Neck is supple.  Chest with minimal basilar  crackles Cardiovascular exam is regular rate and rhythm.  Abdominal exam nontender or distended. No masses palpated. Extremities show trace edema. neuro grossly intact   A/P  1 dyspnea-patient's dyspnea is predominantly related to his interstitial lung disease. His nuclear study showed no ischemia and his ejection fraction is preserved. Right heart catheterization revealed normal pulmonary capillary wedge pressure and mild pulmonary hypertension. He will follow-up with pulmonary for further management of his lung disease.  2 hypertension-blood pressure controlled. Continue present medications.  3 Diabetes mellitus-managed by primary care.   4 CAD-continue ASA; his Crestor was discontinued by pulmonary previously. I have asked him to  discuss this further with Dr. Chase Caller. I would like for him to be on this long-term if possible.  Kirk Ruths, MD

## 2017-01-04 ENCOUNTER — Telehealth: Payer: Self-pay | Admitting: Internal Medicine

## 2017-01-04 NOTE — Telephone Encounter (Signed)
Seeing him 01/05/17. He has IPF. Will discuss Rx nd the 5 pillars of IPF rx  Dr. Brand Males, M.D., [facility].C.P Pulmonary and Critical Care Medicine Staff Physician [place] [facility] Pager: [phone], If no answer or between  15:00h - 7:00h: call [phone]  01/04/2017 2:41 PM

## 2017-01-05 ENCOUNTER — Encounter: Payer: Self-pay | Admitting: Internal Medicine

## 2017-01-05 ENCOUNTER — Ambulatory Visit (INDEPENDENT_AMBULATORY_CARE_PROVIDER_SITE_OTHER): Payer: Medicare Other | Admitting: Internal Medicine

## 2017-01-05 VITALS — BP 130/68 | HR 71 | Ht 72.0 in | Wt 272.0 lb

## 2017-01-05 DIAGNOSIS — J84112 Idiopathic pulmonary fibrosis: Secondary | ICD-10-CM | POA: Diagnosis not present

## 2017-01-05 NOTE — Progress Notes (Signed)
Subjective:     Patient ID: Shane [name], male   DOB: 1941/11/12, 76 y.o.   MRN: [id number]  HPI  PCP Shane A, MD  HPI   IOV 10/12/2016  Chief Complaint  Patient presents with  . Pulomary Consult    Pt referred by Dr. Reynaldo [name] for DOE x months. pt states the SOB has worsened in the last couple weeks. Pt c/o dry cough. pt deneis f/c/s.     76 year old male referred for dyspnea. Reports that he is obese on CPAP on RA for years. 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 He has new onset dyspnea and cough. Details in ACCP ILD questionnaire below. CXR recently personally visualized shows ILD changes. Reports that pneumonia and lasix Rx by PCP have not helped. Review of old CT chest 12/05/2008 shows bibasal ILD without honeuycombing on non HRCT chest. Possibly datin back to 2007 CT abd lung cut. Current findings are worse.   Symptos:  Dyspnea is new x 4-5 months gets dyspneic hurrying on level ground or stops at 100 yerds. Cough x 3 weeks with cough at night and is dry and can be episodic with severe attacks.   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 Did get xposed to insecticide, fertiiler, talc pain, cement, tile    OV  11/23/2016  Chief Complaint  Patient presents with  . Follow-up    Pt here after PFT, HRCT, RHC. Pt state his breathing has slightly improved. Pt c/o increase in dry cough. Pt deneis CP/tightness and f/c/s.    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 He and his wife stated that now he is getting progressively more short  of breath. Although he does tell me that when he tries to work on his car and he tries to push himself he does start feeling better but he feels that he is deconditioned but is unable to condition himself more in order to feel less short of breath. Wife though feels that his dyspnea is more progressive.  In terms of disease etiology:   - He had high resolution CT chest 10/08/2016 and the CT findings are not consistent with UIP. And that is chronicity to this disease with presence even 7 years ago. This mild progression only since then. He had autoimmune profile which is essentially negative.  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 He will only walk 2 laps and stopped because of shortness of breath. But his pulse ox did not drop below 95%. Resting pulse ox was 99%. Peak heart rate was 102. In essence that was clinically insignificant desaturation   - Results for Shane [name] (MRN [id number]) as of 11/23/2016 12:02  Ref. Range 10/14/2016 08:19  FVC-Pre Latest Units: L 3.00  FVC-%Pred-Pre Latest Units: % 63   Results for Shane [name] (MRN [id number]) as of 11/23/2016 12:02  Ref. Range 10/14/2016 08:19  TLC Latest Units: L 4.62  TLC % pred Latest Units: % 60   Results for Shane [name] (MRN [id number]) as of 11/23/2016 12:02  Ref. Range 10/14/2016 08:19  DLCO unc Latest Units: ml/min/mmHg 16.82  DLCO unc % pred Latest Units: % 46     In terms of operative risk for surgical lung biopsy   - His right heart cath shows normal mean pulmonary artery pressure 20 mm with slight elevation pulmonary artery systolic pressure to 34 mmHg and [name] normal wedge pressure.    - His Myoview is  reported normal in November 2017    - He does have sleep apnea and uses CPA   - He does have gait instability issues because of his chronic spine disease but this is stable    OV 01/05/2017  Chief Complaint  Patient presents with  . Follow-up    Pt here after biopsy. Pt states his breathing is unchanged since last seen in 11/2016. Pt c/o dry cough. Pt denies CP/tightness and f/c/s.     llung bx discussion   - here wife wife. 12/26/16 - UIP by Shane [name]. No new diagnosis    has [name] past medical history of Cancer ([place]); Cataract; Chest pain; Chronic kidney disease; Diabetes mellitus; Dyspnea; GERD (gastroesophageal reflux disease); Hyperlipidemia; Hypertension; Neuromuscular disorder ([place]); PONV (postoperative nausea and vomiting); and Sleep apnea.   reports that he has never smoked. He has never used smokeless tobacco.  Past Surgical History:  Procedure Laterality Date  . ANTERIOR FUSION CERVICAL SPINE  2012  . CARDIAC CATHETERIZATION  2011  . CARDIAC CATHETERIZATION N/[name] 11/09/2016   Procedure: Right Heart Cath;  Surgeon: Shane Crome, MD;  Location: [place] [facility];  Service: Cardiovascular;  Laterality: N/[name];  . carpel tunnel     left wrist  . CATARACT EXTRACTION W/ INTRAOCULAR LENS  IMPLANT, BILATERAL  2013  . CERVICAL LAMINECTOMY  2012  . COLONOSCOPY N/[name] 01/14/2013   Procedure: COLONOSCOPY;  Surgeon: Shane Shipper, MD;  [facility];  Service: Endoscopy;  Laterality: N/[name];  . EYE SURGERY    . [place]   left  . LUMBAR LAMINECTOMY  2003  . LUNG BIOPSY Left 12/26/2016   Procedure: LUNG BIOPSY;  Surgeon: Shane Nakayama, MD;  Location: [facility];  Service: Thoracic;  Laterality: Left;  . POSTERIOR FUSION CERVICAL SPINE  2012  . TRIGGER FINGER RELEASE  2011   4th finger left hand  . VIDEO ASSISTED THORACOSCOPY Left 12/26/2016   Procedure: VIDEO ASSISTED THORACOSCOPY;  Surgeon: Shane Nakayama, MD;  Location: [facility];  Service: Thoracic;  Laterality: Left;  Marland Kitchen  VIDEO BRONCHOSCOPY N/[name] 12/26/2016   Procedure: VIDEO BRONCHOSCOPY;  Surgeon: Shane Nakayama, MD;  Location: [facility];  Service: Thoracic;  Laterality: N/[name];    Allergies  Allergen Reactions  . Codeine Hives and Itching    Immunization History  Administered Date(s) Administered  . Influenza, High Dose Seasonal PF 08/05/2016  . Pneumococcal-Unspecified 12/05/2014    Family History  Problem Relation Age of Onset  . Diabetes Mellitus II Mother   . Emphysema Father 70  . Colon cancer Neg Hx   . Esophageal cancer Neg Hx   . Rectal cancer Neg Hx   . Stomach cancer Neg Hx      Current  Outpatient Prescriptions:  .  acetaminophen (TYLENOL) 500 MG tablet, Take 500 mg by mouth every 6 (six) hours as needed for moderate pain., Disp: , Rfl:  .  Ascorbic Acid (VITAMIN C) 1000 MG tablet, Take 1,000 mg by mouth daily., Disp: , Rfl:  .  aspirin 81 MG tablet, Take 81 mg by mouth daily.  , Disp: , Rfl:  .  dapagliflozin propanediol (FARXIGA) 5 MG TABS tablet, Take 5 mg by mouth daily., Disp: , Rfl:  .  diazepam (VALIUM) 5 MG tablet, Take 5 mg by mouth at bedtime as needed (for sleep). , Disp: , Rfl:  .  furosemide (LASIX) 40 MG tablet, Take 40 mg by mouth daily. , Disp: , Rfl:  .  insulin detemir (LEVEMIR) 100 UNIT/ML injection, Inject 22 Units into the skin at bedtime., Disp: , Rfl:  .  insulin lispro (HUMALOG KWIKPEN) 100 UNIT/ML injection, Inject 20 Units into the skin 3 (three) times daily before meals. Sliding scale, Disp: , Rfl:  .  insulin lispro (HUMALOG) 100 UNIT/ML injection, Inject 76 Units into the skin continuous. Use over continuous rate in v-go bag over 24 hours , Disp: , Rfl:  .  Insulin Pump Disposable (V-GO 40) KIT, 76 Units by Does not apply route daily. humalog 100 units dispensed every 24 hours. Dispose v-go bag after 24 hours of use. , Disp: , Rfl:  .  KLOR-CON M20 20 MEQ tablet, Take 20 mEq by mouth daily. , Disp: , Rfl:  .  lisinopril (PRINIVIL,ZESTRIL) 20 MG tablet, Take  20 mg by mouth daily. , Disp: , Rfl:  .  Multiple Vitamin (MULTIVITAMIN) tablet, Take 1 tablet by mouth daily.  , Disp: , Rfl:  .  nitroGLYCERIN (NITROSTAT) 0.4 MG SL tablet, Place 0.4 mg under the tongue every 5 (five) minutes as needed. For chest pain, Disp: , Rfl:  .  pantoprazole (PROTONIX) 40 MG tablet, Take 40 mg by mouth daily.  , Disp: , Rfl:  .  PROAIR HFA 108 (90 Base) MCG/ACT inhaler, Inhale 2 puffs into the lungs every 4 (four) hours as needed for wheezing or shortness of breath. , Disp: , Rfl:  .  promethazine-phenylephrine (PROMETHAZINE VC PLAIN) 6.25-5 MG/5ML SYRP, Take 5 mLs by mouth every 4 (four) hours as needed for congestion., Disp: , Rfl:     Review of Systems     Objective:   Physical Exam Vitals:   01/05/17 0914  BP: 130/68  Pulse: 71  SpO2: 99%  Weight: 272 lb (123.4 kg)  Height: 6' (1.829 m)    Estimated body mass index is 36.89 kg/m as calculated from the following:   Height as of this encounter: 6' (1.829 m).   Weight as of this encounter: 272 lb (123.4 kg). discusion onl visit    Assessment:       ICD-9-CM ICD-10-CM   1. 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[place]) 516.31 J84.112        Plan:      New diagnosis - ntural hx diagnosed.He is likely [name] slow progerssor. Unpredictable course discused  IPF  - Diagnosis date (when patient/family first told: 01/05/2017 - SYmptom onset:few year - Radiology at diagnosis: above - Histopathology at diagnosis: 12/26/16 UIP by [place] - Etiology: idiopathic - Co-morbidities: obesity - Severity: moderate - Trajectory: slow progressino  Rx - Active therapy :L strt esbriuet 01/05/2017 - Oxygen supplementation None - VAccination: TBD - Rehab: TBD - Rx of co-morbidities such as GERD: TBD - PF Support group: will refer -  Clinical Trials: participating in [place]. Will refer to [facility] registry  -Lung Transplant: not [name] candidate   Start esbriet  After discussion below  Will refer to PFF support group  Will  discuss rehab at next vist  Will discuss further research trials at next visit  Followup  - 6 weeks from 01/05/2017 wit me on an APP   ............................................................................  Both drugs OFEV and Esbriet only slow down progression, 1 out of 6 patients  - this means extension in quality of life but no difference in symptoms  - no study directly compares the 2 drugs but efficacy roughly equal at 1 year time point   - OFEV  - - time to first exacerbation possibly reduced in one trial but not in another - twice daily, no titration, potentially more convenient dosing  - no need for sunscreen  - high chance of mild diarrhea but low chance of significant diarrhea needing to stop medication.   - Rx diarrhea with lomotil - slight increase in heart attack risk and theoretical increase in bleeding risk,   - need monthly blood work for 3 months and then every 6 months - monitor liver function   - ESBRIET  - 3 pill three times daily, slow titration.  - Need to wean sunscreen  - Some chance of nausea and anorexia with small chance for diarrhea  - no known heart attack risk - no known bleeding risk,   - need monthly blood work for 6 months - monitor liver function - possible mortality benefit in pooled analysis  - larger world wide experience    > 50% of this > 25 min visit spent in face to face counseling or coordination of care   Dr. Brand Males, M.D., [facility].C.P Pulmonary and Critical Care Medicine Staff Physician [place] [facility] Pager: [phone], If no answer or between  15:00h - 7:00h: call [phone]  01/05/2017 9:49 AM    .

## 2017-01-05 NOTE — Telephone Encounter (Signed)
Shane [name]  Can you get me his email id please? To refer to PFF  tHanks  Dr. Brand Males, M.D., [facility].C.P Pulmonary and Critical Care Medicine Staff Physician [place] [facility] Pager: [phone], If no answer or between  15:00h - 7:00h: call [phone]  01/05/2017 9:56 AM

## 2017-01-05 NOTE — Patient Instructions (Addendum)
ICD-9-CM ICD-10-CM   1. 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  Both drugs OFEV and Esbriet only slow down progression, 1 out of 6 patients  - this means extension in quality of life but no difference in symptoms  - no study directly compares the 2 drugs but efficacy roughly equal at 1 year time point   - OFEV  - - time to first exacerbation possibly reduced in one trial but not in another - twice daily, no titration, potentially more convenient dosing  - no need for sunscreen  - high chance of mild diarrhea but low chance of significant diarrhea needing to stop medication.   - Rx diarrhea with lomotil - slight increase in heart attack risk and theoretical increase in bleeding risk,   - need monthly blood work for 3 months and then every 6 months - monitor liver function   - ESBRIET  - 3 pill three times daily, slow titration.  - Need to wean sunscreen  - Some chance of nausea and anorexia with small chance for diarrhea  - no known heart attack risk - no known bleeding risk,   - need monthly blood work for 6 months - monitor liver function - possible mortality benefit in pooled analysis  - larger world wide experience

## 2017-01-09 NOTE — Telephone Encounter (Signed)
I have share his email with MR HAley o PFF  THanks  Dr. Brand Males, M.D., [facility].C.P Pulmonary and Critical Care Medicine Staff Physician [place] [facility] Pager: [phone], If no answer or between  15:00h - 7:00h: call [phone]  01/09/2017 4:39 PM

## 2017-01-09 NOTE — Telephone Encounter (Signed)
Called and spoke to pt. Pt's email address is: [email or web address]  Will forward to MR.

## 2017-01-10 ENCOUNTER — Ambulatory Visit (INDEPENDENT_AMBULATORY_CARE_PROVIDER_SITE_OTHER): Payer: Medicare Other | Admitting: Cardiology

## 2017-01-10 ENCOUNTER — Encounter: Payer: Self-pay | Admitting: Cardiology

## 2017-01-10 VITALS — BP 130/74 | HR 70 | Ht 72.0 in | Wt 277.0 lb

## 2017-01-10 DIAGNOSIS — I2510 Atherosclerotic heart disease of native coronary artery without angina pectoris: Secondary | ICD-10-CM

## 2017-01-10 DIAGNOSIS — I10 Essential (primary) hypertension: Secondary | ICD-10-CM | POA: Diagnosis not present

## 2017-01-10 DIAGNOSIS — R0602 Shortness of breath: Secondary | ICD-10-CM | POA: Diagnosis not present

## 2017-01-10 NOTE — Patient Instructions (Signed)
Your physician wants you to follow-up in: ONE YEAR WITH DR CRENSHAW You will receive a reminder letter in the mail two months in advance. If you don't receive a letter, please call our office to schedule the follow-up appointment.   If you need a refill on your cardiac medications before your next appointment, please call your pharmacy.  

## 2017-01-13 ENCOUNTER — Telehealth: Payer: Self-pay | Admitting: Internal Medicine

## 2017-01-13 NOTE — Telephone Encounter (Signed)
LM for wife to return call.

## 2017-01-13 NOTE — Telephone Encounter (Signed)
Patient's wife returning, before 5 pm call [phone], after 5 pm [phone] press [phone] when recording comes on and ask for her.

## 2017-01-16 ENCOUNTER — Other Ambulatory Visit: Payer: Self-pay | Admitting: Thoracic Surgery (Cardiothoracic Vascular Surgery)

## 2017-01-16 DIAGNOSIS — J849 Interstitial pulmonary disease, unspecified: Secondary | ICD-10-CM

## 2017-01-16 NOTE — Telephone Encounter (Signed)
lmomtcb x1 

## 2017-01-17 ENCOUNTER — Ambulatory Visit (INDEPENDENT_AMBULATORY_CARE_PROVIDER_SITE_OTHER): Payer: Self-pay | Admitting: Thoracic Surgery (Cardiothoracic Vascular Surgery)

## 2017-01-17 ENCOUNTER — Ambulatory Visit
Admission: RE | Admit: 2017-01-17 | Discharge: 2017-01-17 | Disposition: A | Discharge status: Home / Self Care | Payer: Medicare Other | Source: Ambulatory Visit | Attending: Thoracic Surgery (Cardiothoracic Vascular Surgery) | Admitting: Thoracic Surgery (Cardiothoracic Vascular Surgery)

## 2017-01-17 ENCOUNTER — Encounter: Payer: Self-pay | Admitting: Thoracic Surgery (Cardiothoracic Vascular Surgery)

## 2017-01-17 VITALS — BP 149/69 | HR 66 | Resp 20 | Ht 72.0 in | Wt 277.0 lb

## 2017-01-17 DIAGNOSIS — J849 Interstitial pulmonary disease, unspecified: Secondary | ICD-10-CM

## 2017-01-17 DIAGNOSIS — Z9889 Other specified postprocedural states: Secondary | ICD-10-CM

## 2017-01-17 NOTE — Telephone Encounter (Signed)
pts wife called in and wanted to check on the status medication.  Will forward to elise to follow up on./

## 2017-01-17 NOTE — Progress Notes (Signed)
[address]       [place] [address]             [phone]    HPI: Mr. Zietlow returns for a scheduled postoperative follow-up visit.  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 I did a left VATS lung biopsy on 12/26/2016. His postoperative course was complicated. Pathology showed UIP.  He has some soreness in his anterior chest but no severe incisional pain. He is not taking anything for that. He has not had any shortness of breath.   Current Outpatient Prescriptions  Medication Sig Dispense Refill  . acetaminophen (TYLENOL) 500 MG tablet Take 500 mg by mouth every 6 (six) hours as needed for moderate pain.    . Ascorbic Acid (VITAMIN C) 1000 MG tablet Take 1,000 mg by mouth daily.    Marland Kitchen aspirin 81 MG tablet Take 81 mg by mouth daily.      . diazepam (VALIUM) 5 MG tablet Take 5 mg by mouth at bedtime as needed (for sleep).     . furosemide (LASIX) 40 MG tablet Take 40 mg by mouth daily.     . insulin detemir (LEVEMIR) 100 UNIT/ML injection Inject 22 Units into the skin at bedtime.    . insulin lispro (HUMALOG KWIKPEN) 100 UNIT/ML injection Inject 20 Units into the skin 3 (three) times daily before meals. Sliding scale    . insulin lispro (HUMALOG) 100 UNIT/ML injection Inject 76 Units into the skin continuous. Use over continuous rate in v-go bag over 24 hours     . Insulin Pump Disposable (V-GO 40) KIT 76 Units by Does not apply route daily. humalog 100 units dispensed every 24 hours. Dispose v-go bag after 24 hours of use.     Marland Kitchen KLOR-CON M20 20 MEQ tablet Take 20 mEq by mouth daily.     Marland Kitchen lisinopril (PRINIVIL,ZESTRIL) 20 MG tablet Take 20 mg by mouth daily.     . Multiple Vitamin (MULTIVITAMIN) tablet Take 1 tablet by mouth daily.      . nebivolol (BYSTOLIC) 5 MG tablet Take 5 mg by mouth daily.    . nitroGLYCERIN (NITROSTAT) 0.4 MG SL tablet Place 0.4 mg under the tongue every 5 (five) minutes as needed. For chest pain    . pantoprazole  (PROTONIX) 40 MG tablet Take 40 mg by mouth daily.      Marland Kitchen PROAIR HFA 108 (90 Base) MCG/ACT inhaler Inhale 2 puffs into the lungs every 4 (four) hours as needed for wheezing or shortness of breath.     . promethazine-phenylephrine (PROMETHAZINE VC PLAIN) 6.25-5 MG/5ML SYRP Take 5 mLs by mouth every 4 (four) hours as needed for congestion.     No current facility-administered medications for this visit.     Physical Exam BP (!) 149/69   Pulse 66   Resp 20   Ht 6' (1.829 m)   Wt 277 lb (125.6 kg)   SpO2 92% Comment: RA  BMI 37.92 kg/m  76 year old man in no acute distress Alert and oriented 3 with no focal deficits Cardiac regular rate and rhythm Lungs faint crackles bilaterally Incision erythema along skin edge with skin separation posteriorly  Diagnostic Tests: CHEST  2 VIEW  COMPARISON:  Chest x-ray of December 29, 2016  FINDINGS: The lungs are mildly hypoinflated. The interstitial markings are more conspicuous today especially on the right. There remain coarse on the left. The cardiac silhouette remains enlarged. The pulmonary vascularity is indistinct.  There is a small left pleural effusion versus pleural thickening laterally which appears stable. There is no significant posterior costophrenic angle blunting. There is mild degenerative disc disease of the thoracic spine.  IMPRESSION: Chronic interstitial disease of the lungs with superimposed increased density bilaterally which may reflect pulmonary edema or interstitial pneumonia. Fairly stable pleural thickening along the left lateral thoracic wall. No pneumothorax.   Electronically Signed   By: David  Martinique M.D.   On: 01/17/2017 12:09   Impression: 76 year old man who is now 3 weeks out from a lung biopsy. It showed UIP. He has already met with Dr. Chase Caller and treatment is ongoing.   From a surgical standpoint he is doing well. He has minimal discomfort. He does have some skin separation on the  incision. Also he may have had a reaction to the subcuticular suture. There is no cellulitis or evidence of wound infection. They will disc at that area clean and wash with soap and water. If there is a lot of exudate they will put peroxide on it but otherwise just keep a dressing over it.  His activities are unrestricted.  Plan: Follow-up with Dr. Chase Caller.  I'll be happy to see him again at any time if I can be of any further assistance with his care.  [name], MD [facility] [phone]

## 2017-01-24 ENCOUNTER — Telehealth: Payer: Self-pay | Admitting: Internal Medicine

## 2017-01-24 DIAGNOSIS — J84112 Idiopathic pulmonary fibrosis: Secondary | ICD-10-CM

## 2017-01-24 NOTE — Telephone Encounter (Signed)
Please see phone note from 01/24/17. Will sign off.

## 2017-01-24 NOTE — Telephone Encounter (Signed)
Will send to [place]  1. Shane [name] where are with paper work?  2. I have asked [name] of pff support group to email him. If he does not next 48h then patient to call me bck  3 REfer to pulm rehab on basis of IPF  Thanks  Dr. Brand Males, M.D., [facility].C.P Pulmonary and Critical Care Medicine Staff Physician [place] [facility] Pager: [phone], If no answer or between  15:00h - 7:00h: call [phone]  01/24/2017 12:06 PM

## 2017-01-24 NOTE — Telephone Encounter (Signed)
Several messages about this.  Patient's wife states she is returning call from this afternoon - CB is [phone].  Asks [place] to please tell operator she is expecting our call so this does not go to voicemail.

## 2017-01-24 NOTE — Telephone Encounter (Signed)
Left vm for Shane [name] to inform her that the PAN form was invalid because pt signed DOB instead of that day's date 01/05/17. [name] should have contacted the pt about completing a new PAN form. This form should have been emailed to the pt to complete and sent back to [place].   Forward to [place] to follow up on

## 2017-01-24 NOTE — Telephone Encounter (Signed)
pts wife stated that there was an email that was sent to them but they were not able to sign this email.  They are wanting to know what to do about this?  Will forward to elise to follow up on.

## 2017-01-24 NOTE — Telephone Encounter (Signed)
Called and spoke with pts wife and she stated that at the pts last OV the pt signed some papers to get help with esbriet.  pts wife stated that she wanted to find out about where we are with the paper work.  Daneil Dan please advise.   pts wife also was asking about MR was going to set the pt up with a support group off of [address]  And maybe setting him up for rehab.  MR please advise. Thanks  Allergies  Allergen Reactions  . Codeine Hives and Itching

## 2017-01-25 LAB — FUNGAL ORGANISM REFLEX

## 2017-01-25 LAB — FUNGUS CULTURE RESULT

## 2017-01-25 LAB — FUNGUS CULTURE WITH STAIN

## 2017-01-25 NOTE — Telephone Encounter (Signed)
Patient wife came in to clinic and re-signed the forms. The forms placed MR folder at the front area.Shane [name] # [phone].Shane [name]

## 2017-01-25 NOTE — Telephone Encounter (Signed)
Will route message to Daneil Dan to make her aware.

## 2017-01-25 NOTE — Telephone Encounter (Signed)
Spoke with pt's wife and informed her she can come by the office to re-sign the PAN form. She states she will come by today and complete form, this has been placed up front for pick up. Pt's wife aware that if they do not hear from [facility] by noon on 01/26/17 then to call back. Order placed for pulmonary rehab. Pt's wife aware. Will hold in my box to follow.

## 2017-01-27 ENCOUNTER — Ambulatory Visit (INDEPENDENT_AMBULATORY_CARE_PROVIDER_SITE_OTHER): Payer: Self-pay | Admitting: Physician Assistant

## 2017-01-27 VITALS — BP 120/63 | HR 64 | Resp 16 | Ht 72.0 in | Wt 277.0 lb

## 2017-01-27 DIAGNOSIS — Z9889 Other specified postprocedural states: Secondary | ICD-10-CM

## 2017-01-27 DIAGNOSIS — J849 Interstitial pulmonary disease, unspecified: Secondary | ICD-10-CM

## 2017-01-27 NOTE — Progress Notes (Signed)
  HPI:  Patient returns for follow up because his left chest incision has superficially separated at two places. The patient underwent a bronchoscopy with bronchoalveolar lavage and transbronchial biopsies, left video-assisted thoracoscopy with lung biopsy x3, and on-Q local anesthetic catheter placement by Dr. Roxan Hockey on 12/26/2016. Pathology showed UIP. He was discharged on 12/29/2016. His wife called the office because she was concerned about the left chest wound. He denies fever, chills, or drainage.    Current Outpatient Prescriptions  Medication Sig Dispense Refill  . acetaminophen (TYLENOL) 500 MG tablet Take 500 mg by mouth every 6 (six) hours as needed for moderate pain.    . Ascorbic Acid (VITAMIN C) 1000 MG tablet Take 1,000 mg by mouth daily.    Marland Kitchen aspirin 81 MG tablet Take 81 mg by mouth daily.      . diazepam (VALIUM) 5 MG tablet Take 5 mg by mouth at bedtime as needed (for sleep).     . furosemide (LASIX) 40 MG tablet Take 40 mg by mouth daily.     . insulin detemir (LEVEMIR) 100 UNIT/ML injection Inject 22 Units into the skin at bedtime.    . insulin lispro (HUMALOG KWIKPEN) 100 UNIT/ML injection Inject 20 Units into the skin 3 (three) times daily before meals. Sliding scale    . insulin lispro (HUMALOG) 100 UNIT/ML injection Inject 76 Units into the skin continuous. Use over continuous rate in v-go bag over 24 hours     . Insulin Pump Disposable (V-GO 40) KIT 76 Units by Does not apply route daily. humalog 100 units dispensed every 24 hours. Dispose v-go bag after 24 hours of use.     Marland Kitchen KLOR-CON M20 20 MEQ tablet Take 20 mEq by mouth daily.     Marland Kitchen lisinopril (PRINIVIL,ZESTRIL) 20 MG tablet Take 20 mg by mouth daily.     . Multiple Vitamin (MULTIVITAMIN) tablet Take 1 tablet by mouth daily.      . nebivolol (BYSTOLIC) 5 MG tablet Take 5 mg by mouth daily.    . nitroGLYCERIN (NITROSTAT) 0.4 MG SL tablet Place 0.4 mg under the tongue every 5 (five) minutes as needed. For chest  pain    . pantoprazole (PROTONIX) 40 MG tablet Take 40 mg by mouth daily.      Marland Kitchen PROAIR HFA 108 (90 Base) MCG/ACT inhaler Inhale 2 puffs into the lungs every 4 (four) hours as needed for wheezing or shortness of breath.     . promethazine-phenylephrine (PROMETHAZINE VC PLAIN) 6.25-5 MG/5ML SYRP Take 5 mLs by mouth every 4 (four) hours as needed for congestion.    Vital Signs: BP 120/63, HR 64, RR 16, oxygen saturation 96% on room air   Physical Exam: Left chest wound is superficially dehisced proximally and distally (mid section is healed). There is no erythema or drainage.   Impression and Plan: Regarding his superficial skin separation of the left chest wound, the likely etiology is reaction to subcuticular suture. We placed Benzoin and steri strips. Patient instructed to leave them on for 10 days and then may remove them. If he develops erythema, drainage, or the wound remains superficially separated, he will return to see us;otherwise, he will be seen on a PRN basis.   Nani Skillern, PA-C [facility] [phone]

## 2017-01-30 NOTE — Telephone Encounter (Signed)
Forms were re-faxed on 01/27/17. Will await response.

## 2017-02-02 NOTE — Telephone Encounter (Signed)
Received fax stating the pt has been referred to the [facility] for pt assistance and PA form has been received and faxed back to [employer] with supporting clinical data, will await decision.

## 2017-02-02 NOTE — Telephone Encounter (Signed)
I was unable to locate anything for pt in MR's cubby.  Shane [name] have you received a response?

## 2017-02-03 MED ORDER — PIRFENIDONE 267 MG PO CAPS: 3.0000 | ORAL_CAPSULE | Freq: Three times a day (TID) | ORAL | 5 refills | Status: DC

## 2017-02-03 NOTE — Telephone Encounter (Signed)
lmtcb x1 for pt's wife, [name].

## 2017-02-03 NOTE — Telephone Encounter (Signed)
Wife called back sating the med has been authorized and the company contacted patient to see about our office sending Rx for Esbriet to them.  Wife states they have been waiting for 3 weeks. Company told them that if we call in Rx then they can overnight the Rx to the patient.  Wife's phone number [name]) [phone]  [place] [phone]   PT # [id number]

## 2017-02-03 NOTE — Telephone Encounter (Signed)
Ariel from [employer] requesting rx for Jacklynn Bue - she can be reached at [phone] - she is also sending fax request-pr

## 2017-02-03 NOTE — Telephone Encounter (Signed)
lmtcb x1 for pt's wife. 

## 2017-02-03 NOTE — Telephone Encounter (Signed)
Will keep message open until PA determination has been made.

## 2017-02-03 NOTE — Telephone Encounter (Signed)
Called [employer] and was advised the claim has been denied because insurance is needing additional information from the pt. Called and spoke to pt's wife, [name], and gave her this information. [name] will call insurance back an give whatever info is needed and call us back. We will need to call [facility] to see if the medication has been approved to then call in the Rx to [place].   Esbriet will start out with the traditional titrating dose to lead to the 3 caps/tabs TID.

## 2017-02-03 NOTE — Telephone Encounter (Signed)
Rx will be sent in per MR >> Esbriet 3 capsules TID. Message will be routed back to [place] to clarify if anything else is needed before message is closed.

## 2017-02-03 NOTE — Telephone Encounter (Signed)
Pt wife called and said that med has been approved and call and speak to [place] @ [phone] and call in med wife is going to contact ins to get hippa  Paper filled so that they can release any info to her a/b husband.Shane [name]

## 2017-02-03 NOTE — Telephone Encounter (Signed)
Patient wife Shane [name] called stats that we need to call in rx for Esbriet to specialty pharmacy at [phone]. Wife states that if rx is called in that it can be expedited and sent to patient overnight. She can be reached at [phone]

## 2017-02-03 NOTE — Telephone Encounter (Signed)
Pt wife returning call.Stanley A Dalton' °

## 2017-02-03 NOTE — Telephone Encounter (Signed)
Rx has been sent in already. I contacted [facility], they have the prescription that was sent to them earlier today. Spoke to pharmacy tech at [employer] and I was told that they could not ship the medication until the pt is spoken to and is notified about how much his copay will be. I spoke with pt's wife and she is aware of the above information. States that she will have the pt contact them to get this taken care of.

## 2017-02-06 ENCOUNTER — Telehealth: Payer: Self-pay | Admitting: Internal Medicine

## 2017-02-06 NOTE — Telephone Encounter (Signed)
lmtcb x1 for pt's wife. 

## 2017-02-06 NOTE — Telephone Encounter (Signed)
Called and spoke with pts wife and she stated that the [employer] advised her that they would not be able to help them since they have dispersed all of their funds.  pts wife stated that they will not be able to cover the co-pay for the esbriet of $2627.38 for a 30 day supply.  They are needing assistance to pay for this medication since this has been approved by his insurance. Will forward to elise to follow up on pt assistance forms for the pt.  Elsie please advise. thanks

## 2017-02-06 NOTE — Telephone Encounter (Signed)
Received form from [employer] [facility] about the pts esbriet.  This has been approved through  12/04/17 under medicare part D.

## 2017-02-07 LAB — ACID FAST CULTURE WITH REFLEXED SENSITIVITIES (MYCOBACTERIA): Acid Fast Culture: NEGATIVE

## 2017-02-07 NOTE — Telephone Encounter (Signed)
Spoke with Daneil Dan regarding this- if [employer] is out of funding for the year, the next step for the pt would be for him to call [place] at [phone] and make them aware that [employer] is out of funding for the year and that they cannot afford the high copay.  [employer] should then be able to refer the pt to their copay assistance program.  This has been discussed in previous telephone encounters.  lmtcb X2 for pt's wife.

## 2017-02-10 NOTE — Telephone Encounter (Signed)
Spoke with pt wife and informed her of Ashley's message. She said she would give them a call and then contact us back and she needed anything further.

## 2017-02-14 ENCOUNTER — Telehealth: Payer: Self-pay | Admitting: Internal Medicine

## 2017-02-14 NOTE — Telephone Encounter (Signed)
Spoke with pt's wife Shane [name], states that a SMN for Shane [name] is needed to send to [place].   Per pt wife, a form has been faxed to our office to [facility] attention. Found form off of fax from [place], states that pt has been referred to [facility] and nothing further is needed from our office at this time.  This form has been placed in MR's look at for Shane [name] to keep with other records.  Will route to [place] as FYI.

## 2017-02-16 NOTE — Telephone Encounter (Signed)
Spoke with the pt's spouse  She is aware that the form is waiting to be signed  [facility] already confirmed on 02/14/17 that it's in his [place], spouse wants to be notified once this has been completed  Thanks

## 2017-02-16 NOTE — Telephone Encounter (Signed)
Patient's wife is calling to following up on SMN.[place]. Wife states she will be available now until 3 and then 3:30-5.

## 2017-02-20 ENCOUNTER — Ambulatory Visit (INDEPENDENT_AMBULATORY_CARE_PROVIDER_SITE_OTHER): Payer: Medicare Other | Admitting: Internal Medicine

## 2017-02-20 ENCOUNTER — Telehealth (HOSPITAL_COMMUNITY): Payer: Self-pay | Admitting: *Deleted

## 2017-02-20 ENCOUNTER — Encounter: Payer: Self-pay | Admitting: Internal Medicine

## 2017-02-20 DIAGNOSIS — Z006 Encounter for examination for normal comparison and control in clinical research program: Secondary | ICD-10-CM

## 2017-02-20 DIAGNOSIS — J84112 Idiopathic pulmonary fibrosis: Secondary | ICD-10-CM

## 2017-02-20 NOTE — Progress Notes (Signed)
IPF PRO Registry Purpose: To collect data and biological samples that will support future research studies. Registry will describe current approaches to diagnosis and treatment of IPF, analyze participant characteristics to describe the natural history of the disease, assess quality of life, describe participants interactions with the health care system, describe IPF treatment practices across multiple institutions, and utilize biological samples linked to well characterized IPF participants to identify disease biomarkers.  ---------------------------------------------------------------------------------------------- Clinical Research Coordinator / Research RN note : This visit for Subject Shane [name] with DOB [id number] on 02/20/2017 for the above protocol is Enrollment Visit  and is for purpose of research . The consent for this encounter is under Protocol Version 2/Feb/2018 and is currently IRB approved. Subject expressed continued interest and consent in continuing as a study subject. Subject thanked for participation.  Subjects spouse, Shane [name], was present at time of consent. All study procedures were completed per the above mentioned protocol. Please refer to the subjects paper source binder for PRO questionnaires and laboratory assessment form, and the informed consent documentation checklist.   Signed by  [place], [employer] Coordinator [facility]  [place], [place] 10:54 AM 02/20/2017

## 2017-02-20 NOTE — Patient Instructions (Signed)
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[place]) Clinically stable Frustrating no esbriet yet  Plan - apply to in house program with [employer] 02/20/2017; we will fax 02/20/2017 - rfer pulmonary rehab  - glad you are part of PFF - meet [place] Bing of [facility] for IPF registry study 02/20/17  Followup - call me by end of week with deliver date for esbriet - so we can aggressively follow or tide over with samples - 6 weeks do spirometry with DLCO with Tammy Wilson (Pre-bd spiro and dlco only. No lung volume or bd response) - 6 weeks return to see me

## 2017-02-20 NOTE — Assessment & Plan Note (Signed)
Clinically stable Frustrating no esbriet yet  Plan - apply to in house program with [employer] 02/20/2017; we will fax 02/20/2017 - rfer pulmonary rehab  - glad you are part of PFF - meet [place] Bing of [facility] for IPF registry study 02/20/17  Followup - call me by end of week with deliver date for esbriet - so we can aggressively follow or tide over with samples - 6 weeks do spirometry with DLCO with Tammy Wilson (Pre-bd spiro and dlco only. No lung volume or bd response) - 6 weeks return to see me

## 2017-02-20 NOTE — Progress Notes (Signed)
Subjective:     Patient ID: Shane [name], male   DOB: 11-16-1941, 76 y.o.   MRN: [id number]  HPI   PCP ARONSON,RICHARD A, MD  HPI   IOV 10/12/2016  Chief Complaint  Patient presents with  . Pulomary Consult    Pt referred by Dr. [name] for DOE x months. pt states the SOB has worsened in the last couple weeks. Pt c/o dry cough. pt deneis f/c/s.     76 year old male referred for dyspnea. Reports that he is obese on CPAP on RA for years. 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 He has new onset dyspnea and cough. Details in [facility] ILD questionnaire below. CXR recently personally visualized shows ILD changes. Reports that pneumonia and lasix Rx by PCP have not helped. Review of old CT chest 12/05/2008 shows bibasal ILD without honeuycombing on non HRCT chest. Possibly datin back to 2007 CT abd lung cut. Current findings are worse.   Symptos:  Dyspnea is new x 4-5 months gets dyspneic hurrying on level ground or stops at 100 yerds. Cough x 3 weeks with cough at night and is dry and can be episodic with severe attacks.   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 Did get xposed to insecticide, fertiiler, talc pain, cement, tile    OV  11/23/2016  Chief Complaint  Patient presents with  . Follow-up    Pt here after PFT, HRCT, RHC. Pt state his breathing has slightly improved. Pt c/o increase in dry cough. Pt deneis CP/tightness and f/c/s.    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 He and his wife stated that now he is getting progressively more  short of breath. Although he does tell me that when he tries to work on his car and he tries to push himself he does start feeling better but he feels that he is deconditioned but is unable to condition himself more in order to feel less short of breath. Wife though feels that his dyspnea is more progressive.  In terms of disease etiology:   - He had high resolution CT chest 10/08/2016 and the CT findings are not consistent with UIP. And that is chronicity to this disease with presence even 7 years ago. This mild progression only since then. He had autoimmune profile which is essentially negative.  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 He will only walk 2 laps and stopped because of shortness of breath. But his pulse ox did not drop below 95%. Resting pulse ox was 99%. Peak heart rate was 102. In essence that was clinically insignificant desaturation   - Results for AUDY, DAUPHINE (MRN [id number]) as of 11/23/2016 12:02  Ref. Range 10/14/2016  08:19  FVC-Pre Latest Units: L 3.00  FVC-%Pred-Pre Latest Units: % 63   Results for DEONDRICK, SEARLS (MRN [id number]) as of 11/23/2016 12:02  Ref. Range 10/14/2016 08:19  TLC Latest Units: L 4.62  TLC % pred Latest Units: % 60   Results for WOJCIECH, WILLETTS (MRN [id number]) as of 11/23/2016 12:02  Ref. Range 10/14/2016 08:19  DLCO unc Latest Units: ml/min/mmHg 16.82  DLCO unc % pred Latest Units: % 46     In terms of operative risk for surgical lung biopsy   - His right heart cath shows normal mean pulmonary artery pressure 20 mm with slight elevation pulmonary artery systolic pressure to 34 mmHg and a normal wedge pressure.    - His Myoview is  reported normal in November 2017    - He does have sleep apnea and uses CPA   - He does have gait instability issues because of his chronic spine disease but this is stable    OV 01/05/2017  Chief Complaint  Patient presents with  . Follow-up    Pt here after biopsy. Pt states his breathing is unchanged since last seen in 11/2016. Pt c/o dry cough. Pt denies CP/tightness and f/c/s.     llung bx discussion   - here wife wife. 12/26/16 - UIP by [name]. No new diagnosis    OV 02/20/2017  Chief Complaint  Patient presents with  . Follow-up    Pt states his breathing is unchanged since last OV. Pt still has not recieved the Esbriet because of financial issues and unable to locate a foundation to help. Pt c/o dry cough. Pt denies CP/tightness.     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  He is here to follow-up how he was doing on Pirfenidone (Esbriet). However he had delays getting charity support. Helpful foundation ran out of money versus from his income level too high to provide support. He wants to plan of the [place]. Overall he feels stable in terms of cough and dyspnea. He is interested in pulmonary rehabilitation and he has been in touch with [name] the chair of the local pulmonary fibrosis foundation support group and plans to attend the meeting March 29. There are no other new issues     has a past medical history of Cancer ([place]); Cataract; Chest pain; Chronic kidney disease; Diabetes mellitus; Dyspnea; GERD (gastroesophageal reflux disease); Hyperlipidemia; Hypertension; Neuromuscular disorder ([place]); PONV (postoperative nausea and vomiting); and Sleep apnea.   reports that he has never smoked. He has never used smokeless tobacco.  Past Surgical History:  Procedure Laterality Date  . ANTERIOR FUSION CERVICAL SPINE  2012  . CARDIAC CATHETERIZATION  2011  . CARDIAC CATHETERIZATION N/A 11/09/2016   Procedure: Right Heart Cath;  Surgeon:  Belva Crome, MD;  Location: [facility];  Service: Cardiovascular;  Laterality: N/A;  . carpel tunnel     left wrist  . CATARACT EXTRACTION W/ INTRAOCULAR LENS  IMPLANT, BILATERAL  2013  . CERVICAL LAMINECTOMY  2012  . COLONOSCOPY N/A 01/14/2013   Procedure: COLONOSCOPY;  Surgeon: Irene Shipper, MD;  Location: [facility];  Service: Endoscopy;  Laterality: N/A;  . EYE SURGERY    . [place]   left  . LUMBAR LAMINECTOMY  2003  . LUNG BIOPSY Left 12/26/2016   Procedure: LUNG BIOPSY;  Surgeon: [name], MD;  Location: [facility];  Service: Thoracic;  Laterality: Left;  . POSTERIOR FUSION CERVICAL  SPINE  2012  . TRIGGER FINGER RELEASE  2011   4th finger left hand  . VIDEO ASSISTED THORACOSCOPY Left 12/26/2016   Procedure: VIDEO ASSISTED THORACOSCOPY;  Surgeon: [name], MD;  Location: [facility];  Service: Thoracic;  Laterality: Left;  Marland Kitchen VIDEO BRONCHOSCOPY N/A 12/26/2016   Procedure: VIDEO BRONCHOSCOPY;  Surgeon: [name], MD;  Location: [facility];  Service: Thoracic;  Laterality: N/A;    Allergies  Allergen Reactions  . Codeine Hives and Itching    Immunization History  Administered Date(s) Administered  . Influenza, High Dose Seasonal PF 08/05/2016  . Pneumococcal-Unspecified 12/05/2014    Family History  Problem Relation Age of Onset  . Diabetes Mellitus II Mother   . Emphysema Father 86  . Colon cancer Neg Hx   . Esophageal cancer Neg Hx   . Rectal cancer Neg Hx   . Stomach cancer Neg Hx      Current Outpatient Prescriptions:  .  acetaminophen (TYLENOL) 500 MG tablet, Take 500 mg by mouth every 6 (six) hours as needed for moderate pain., Disp: , Rfl:  .  Ascorbic Acid (VITAMIN C) 1000 MG tablet, Take 1,000 mg by mouth daily., Disp: , Rfl:  .  aspirin 81 MG tablet, Take 81 mg by mouth daily.  , Disp: , Rfl:  .  diazepam (VALIUM) 5 MG tablet, Take 5 mg by mouth at bedtime as needed (for sleep). , Disp: , Rfl:  .  furosemide (LASIX) 40  MG tablet, Take 40 mg by mouth daily. , Disp: , Rfl:  .  insulin detemir (LEVEMIR) 100 UNIT/ML injection, Inject 22 Units into the skin at bedtime., Disp: , Rfl:  .  insulin lispro (HUMALOG KWIKPEN) 100 UNIT/ML injection, Inject 20 Units into the skin 3 (three) times daily before meals. Sliding scale, Disp: , Rfl:  .  insulin lispro (HUMALOG) 100 UNIT/ML injection, Inject 76 Units into the skin continuous. Use over continuous rate in v-go bag over 24 hours , Disp: , Rfl:  .  Insulin Pump Disposable (V-GO 40) KIT, 76 Units by Does not apply route daily. humalog 100 units dispensed every 24 hours. Dispose v-go bag after 24 hours of use. , Disp: , Rfl:  .  KLOR-CON M20 20 MEQ tablet, Take 20 mEq by mouth daily. , Disp: , Rfl:  .  lisinopril (PRINIVIL,ZESTRIL) 20 MG tablet, Take 20 mg by mouth daily. , Disp: , Rfl:  .  Multiple Vitamin (MULTIVITAMIN) tablet, Take 1 tablet by mouth daily.  , Disp: , Rfl:  .  nebivolol (BYSTOLIC) 5 MG tablet, Take 5 mg by mouth daily., Disp: , Rfl:  .  nitroGLYCERIN (NITROSTAT) 0.4 MG SL tablet, Place 0.4 mg under the tongue every 5 (five) minutes as needed. For chest pain, Disp: , Rfl:  .  pantoprazole (PROTONIX) 40 MG tablet, Take 40 mg by mouth daily.  , Disp: , Rfl:  .  PROAIR HFA 108 (90 Base) MCG/ACT inhaler, Inhale 2 puffs into the lungs every 4 (four) hours as needed for wheezing or shortness of breath. , Disp: , Rfl:  .  promethazine-phenylephrine (PROMETHAZINE VC PLAIN) 6.25-5 MG/5ML SYRP, Take 5 mLs by mouth every 4 (four) hours as needed for congestion., Disp: , Rfl:  .  Rosuvastatin Calcium (CRESTOR PO), Take by mouth daily., Disp: , Rfl:  .  Pirfenidone (ESBRIET) 267 MG CAPS, Take 3 capsules by mouth 3 (three) times daily. (Patient not taking: Reported on 02/20/2017), Disp: 270 capsule, Rfl: 5  Review of Systems     Objective:   Physical Exam Vitals:   02/20/17 0924  BP: (!) 142/68  Pulse: 71  SpO2: 99%  Weight: 273 lb (123.8 kg)  Height: 6'  (1.829 m)   Brief exam only: He has basal crackles are unchanged. He looks well and is using a cane as always    Assessment:       ICD-9-CM ICD-10-CM   1. 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[place]) 516.31 J84.112        Plan:     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[place]) Clinically stable Frustrating no esbriet yet  Plan - apply to in house program with [facility] 02/20/2017; we will fax 02/20/2017 - rfer pulmonary rehab  - glad you are part of P[facility] - meet [place] [facility] for IPF registry study 02/20/17  Followup - call me by end of week with deliver date for esbriet - so we can aggressively follow or tide over with samples - 6 weeks do spirometry with DLCO with Tammy Wilson (Pre-bd spiro and dlco only. No lung volume or bd response) - 6 weeks return to see me   Rehabilitation lab to accommodate him with recumbent bike and weight training but now walking  (> 50% of this 15 min visit spent in face to face counseling or/and coordination of care)    Dr. Brand Males, M.D., [facility].C.P Pulmonary and Critical Care Medicine Staff Physician [facility] Pager: [phone], If no answer or between  15:00h - 7:00h: call [phone]  02/20/2017 9:48 AM

## 2017-02-22 NOTE — Telephone Encounter (Signed)
[facility] faxed over another SMN, this has been completed and signed by MR. This has been faxed over to [facility]. Will keep message open to f/u back up with [facility] and pt to see if med has been approved by [employer].

## 2017-02-22 NOTE — Telephone Encounter (Signed)
EE please advise if this form was signed and if this message can be signed off.  thanks

## 2017-02-24 NOTE — Telephone Encounter (Signed)
ATC pt's wife, no VM. Will need to see if pt has received Ofev.

## 2017-02-27 ENCOUNTER — Encounter: Payer: Self-pay | Admitting: *Deleted

## 2017-02-27 NOTE — Telephone Encounter (Signed)
Pt's wife is returning phone call. [phone] work number. Will be there until after 4. After pm please call mobile number on file.

## 2017-02-27 NOTE — Telephone Encounter (Signed)
Called patient on mobile number ( after 4pm)  Pt aware that someone from [facility] will call to get him enrolled, they are backed up and can be a few weeks before they are able to get him enrolled and started in the program. Pt wife states that she will await a call. Will back if not heard anything in the next few weeks.   Pt received the OFEV on 02/24/17 -- pt started taking this today. Pt wife aware to contact our office if any severe GI upset. Will send to [facility] to make her aware. Nothing further needed.

## 2017-02-27 NOTE — Telephone Encounter (Signed)
lmomtcb x 1 for the pts wife.  

## 2017-02-27 NOTE — Telephone Encounter (Signed)
Spoke with pt's wife and she stated he started his ofev today. Wife wanted to know where we were on the process of getting him into pulmonary rehab.   Will forward to the pcc's to see if they have any information of this

## 2017-02-27 NOTE — Telephone Encounter (Signed)
Well we sent the referral on 02/20/17 so pul-rehab will call them it might be a few wkks or more I am not sure they can call the [phone] and ask if they like [facility] [place]

## 2017-03-26 IMAGING — CR DG CHEST 2V
3 series · 3 of 3 positions shown · non-contrast
Comparison: Chest CT dated 10/18/2016 and chest x-ray dated
09/27/2011.

CLINICAL DATA: Preoperative imaging for lung biopsy scheduled
12/21/2016. History of diabetes.

EXAM:
CHEST  2 VIEW

[w chest pa]
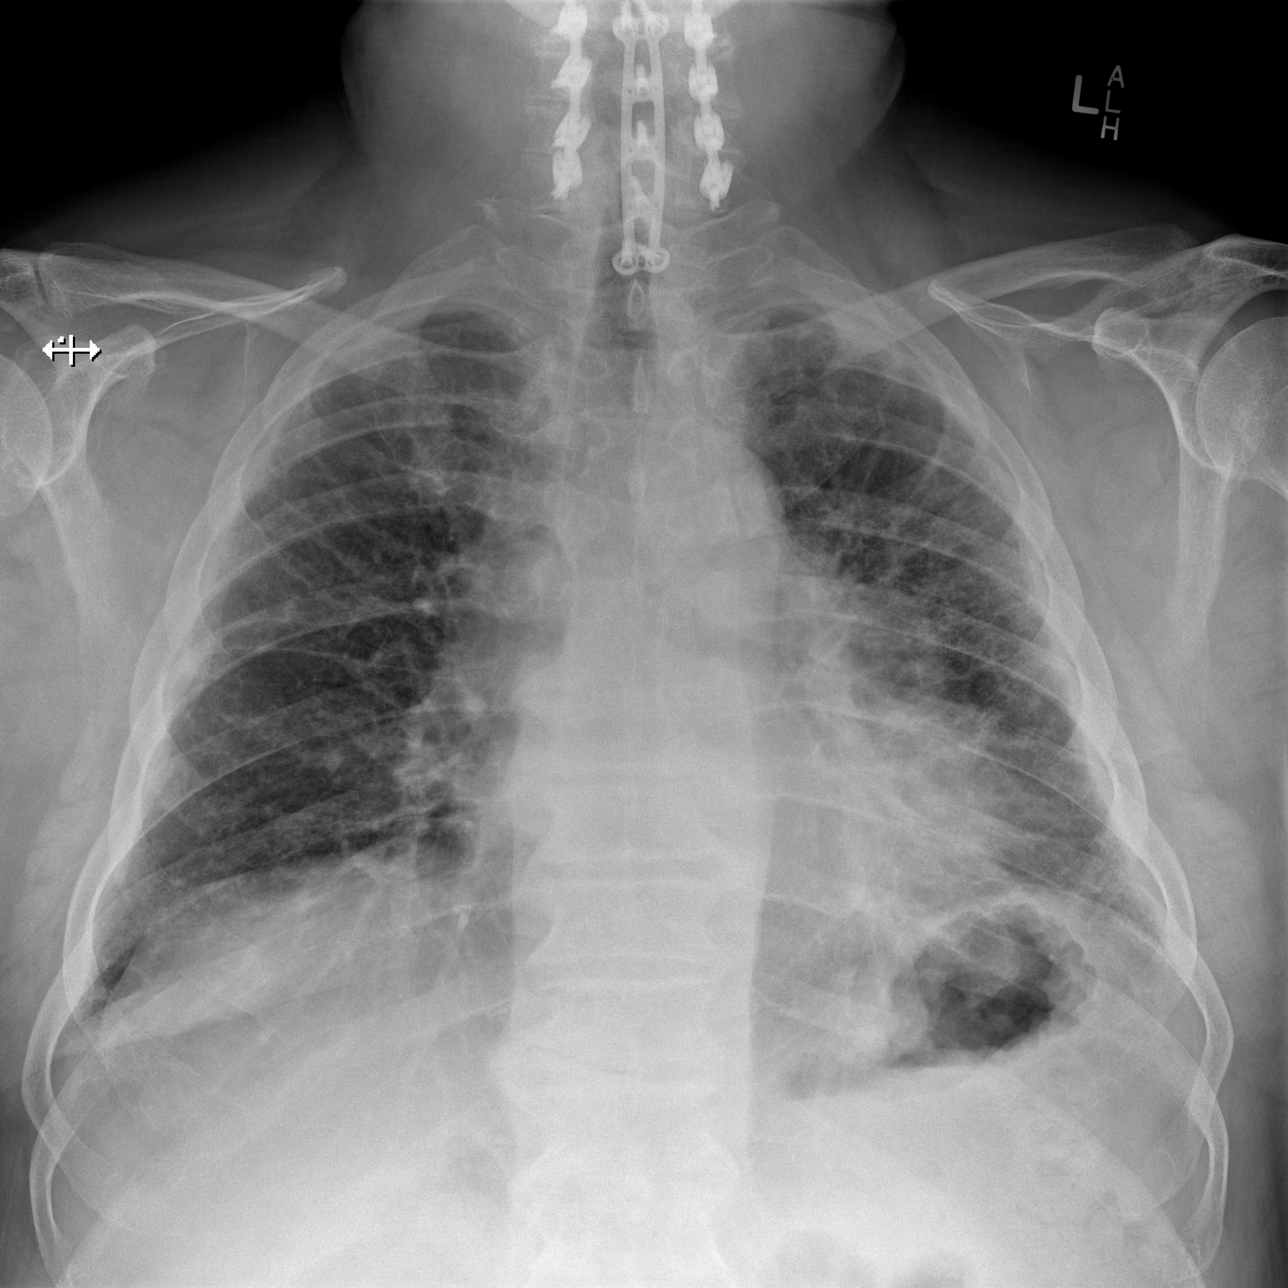

[w chest lat (1 of 2)]
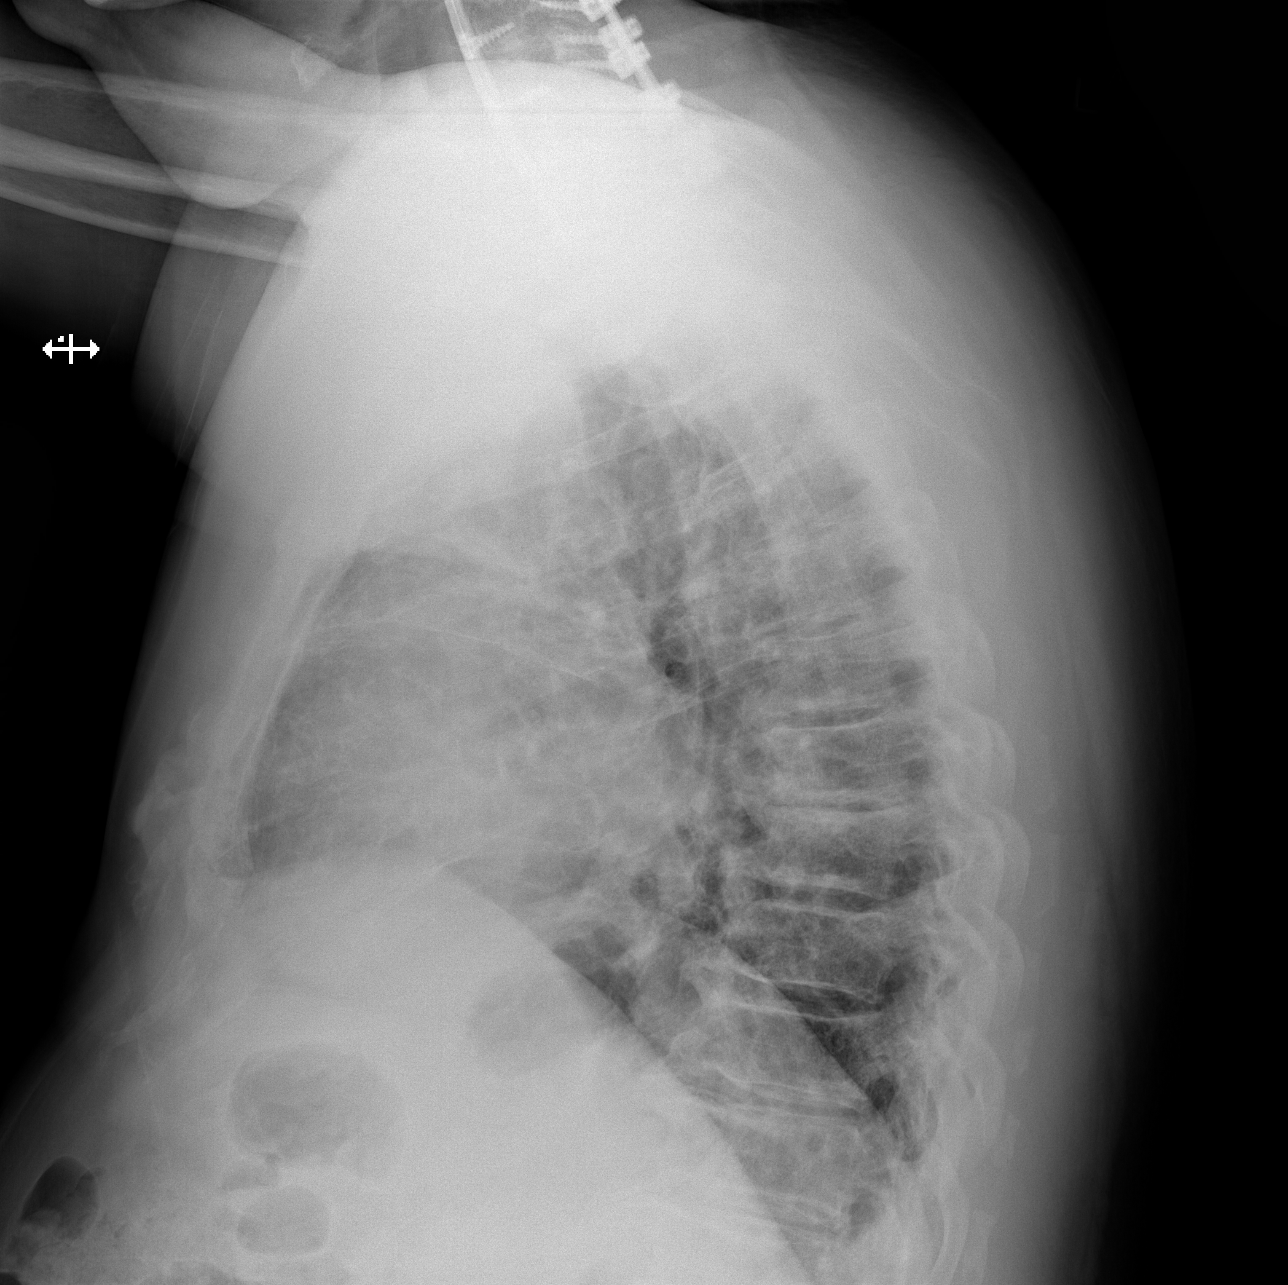

[w chest lat (2 of 2)]
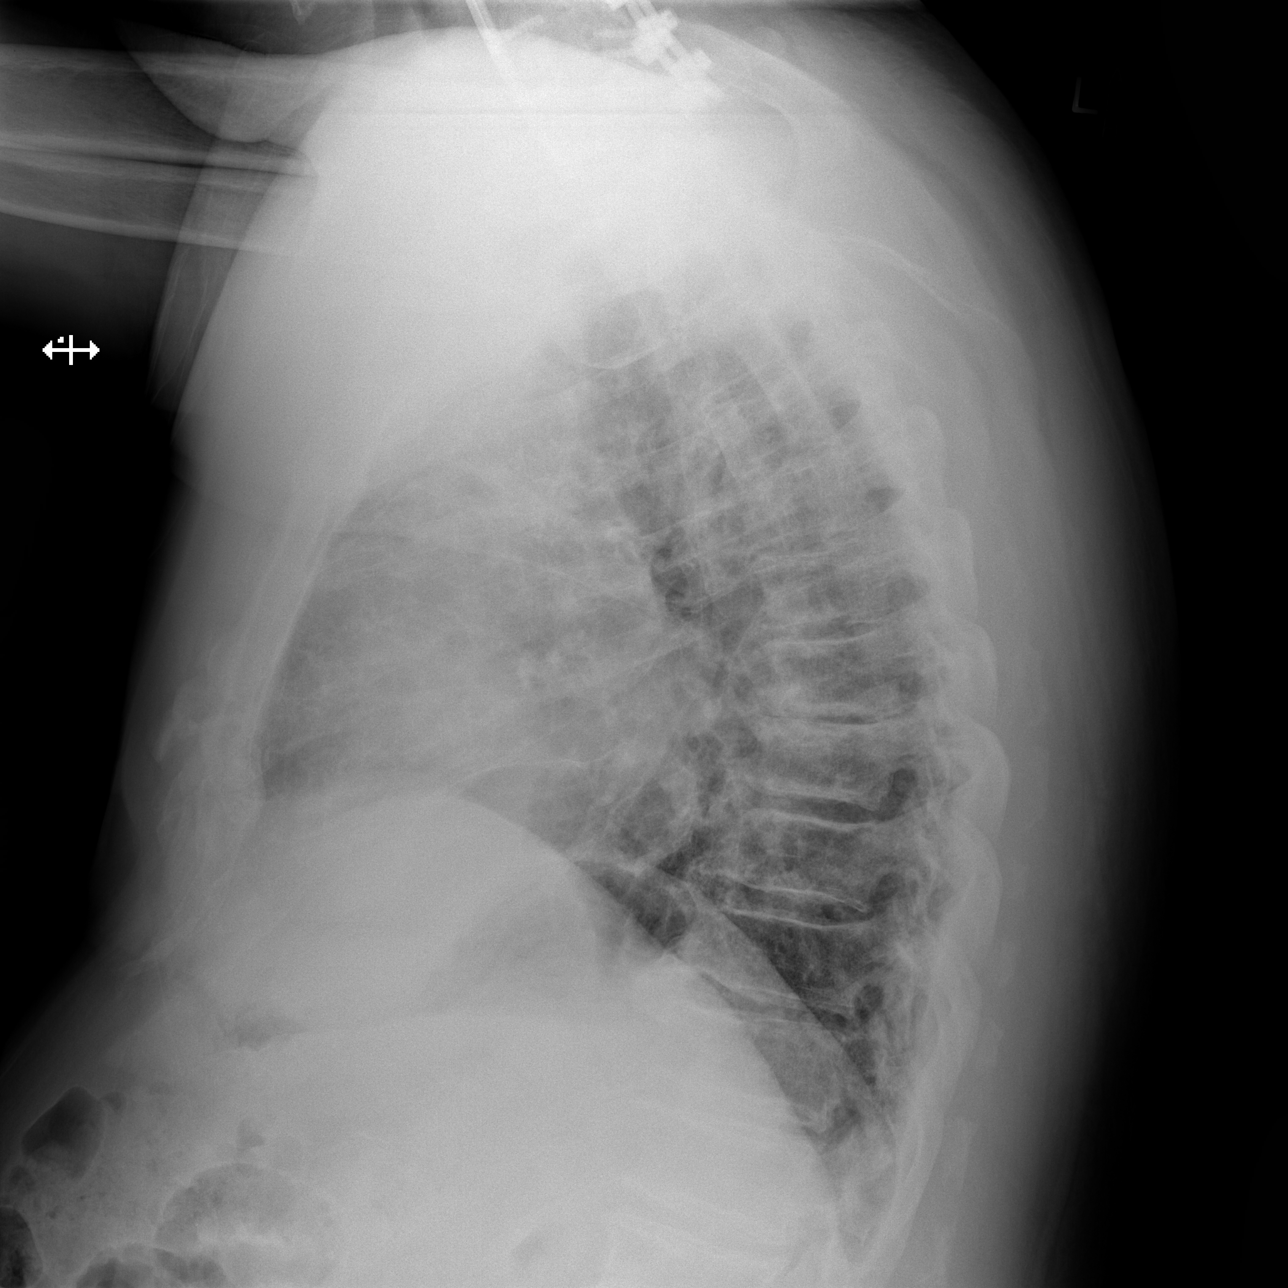

[3 of 3 positions shown; findings below may reference images not displayed]

FINDINGS: Coarse interstitial markings are again seen throughout both lungs,
similar to findings on recent chest CT, at least mildly progressed
compared to the earlier chest x-ray of 09/27/2011. No new confluent
airspace opacity to suggest a developing pneumonia. No pleural
effusion seen. Cardiomediastinal silhouette is stable. No acute or
suspicious osseous finding.
IMPRESSION: No acute findings.  No evidence of pneumonia.

Coarse interstitial markings throughout both lungs, similar to
findings on recent chest CT, compatible with that CT report
description of probable chronic hypersensitivity pneumonitis versus
nonspecific interstitial pneumonia, at least mildly progressed
compared to an earlier chest x-ray of 09/27/2011.

## 2017-03-27 ENCOUNTER — Encounter (HOSPITAL_COMMUNITY)
Admission: RE | Admit: 2017-03-27 | Discharge: 2017-03-27 | Disposition: A | Discharge status: Home / Self Care | Payer: Medicare Other | Source: Ambulatory Visit | Attending: Internal Medicine | Admitting: Internal Medicine

## 2017-03-27 ENCOUNTER — Encounter (HOSPITAL_COMMUNITY): Payer: Self-pay

## 2017-03-27 VITALS — BP 119/53 | HR 58 | Resp 18 | Ht 71.5 in | Wt 270.5 lb

## 2017-03-27 DIAGNOSIS — J84112 Idiopathic pulmonary fibrosis: Secondary | ICD-10-CM | POA: Diagnosis present

## 2017-03-27 DIAGNOSIS — E1122 Type 2 diabetes mellitus with diabetic chronic kidney disease: Secondary | ICD-10-CM | POA: Diagnosis not present

## 2017-03-27 DIAGNOSIS — Z794 Long term (current) use of insulin: Secondary | ICD-10-CM | POA: Insufficient documentation

## 2017-03-27 DIAGNOSIS — E785 Hyperlipidemia, unspecified: Secondary | ICD-10-CM | POA: Diagnosis not present

## 2017-03-27 DIAGNOSIS — I129 Hypertensive chronic kidney disease with stage 1 through stage 4 chronic kidney disease, or unspecified chronic kidney disease: Secondary | ICD-10-CM | POA: Insufficient documentation

## 2017-03-27 DIAGNOSIS — G4730 Sleep apnea, unspecified: Secondary | ICD-10-CM | POA: Diagnosis not present

## 2017-03-27 DIAGNOSIS — Z7982 Long term (current) use of aspirin: Secondary | ICD-10-CM | POA: Insufficient documentation

## 2017-03-27 DIAGNOSIS — K219 Gastro-esophageal reflux disease without esophagitis: Secondary | ICD-10-CM | POA: Diagnosis not present

## 2017-03-27 DIAGNOSIS — N189 Chronic kidney disease, unspecified: Secondary | ICD-10-CM | POA: Diagnosis not present

## 2017-03-27 DIAGNOSIS — Z79899 Other long term (current) drug therapy: Secondary | ICD-10-CM | POA: Diagnosis not present

## 2017-03-27 NOTE — Progress Notes (Signed)
Shane [name] 76 y.o. male Pulmonary Rehab Orientation Note Patient arrived today in Cardiac and Pulmonary Rehab for orientation to Pulmonary Rehab. He was transported from [employer] via wheel chair, accompanied by wife. He has not been prescribed oxygen for home use. He is compliant with his CPAP use each night. Color good, skin warm and dry. Patient is oriented to time and place. Patient's medical history, psychosocial health, and medications reviewed. Psychosocial assessment reveals pt lives with their spouse. His adult son and grandchild has recently moved in with them after their son separated from his wife. 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 Pt hobbies include watching TV after a long day. Pt reports his stress level is low. Pt does not exhibit signs of depression. PHQ2/9 score 0/na. Pt shows good  coping skills with positive outlook. He is offered emotional support and reassurance. He is also given handouts from the pulmonary fibrosis foundation and encouraged to visit their website for more indepth information. Will continue to monitor and evaluate progress toward psychosocial goal(s) of maintaining a positive outlook regarding his diagnosis of IPF. Physical assessment reveals heart rate is normal. Upper lobes clear, fine crackles in both lower lobes bilat. Grip strength equal, strong. Distal pulses palpable. Moderate amount of pitting edema noted to lower legs and ankles. Patient reports he does take medications as prescribed. Patient states he follows a Diabetic diet. He is on an insulin pump (V-go) and also administers levimire at HS. The patient reports no specific efforts to gain or lose weight. Patient's weight will be monitored closely. Demonstration and practice of PLB using pulse oximeter. Patient able to return demonstration  satisfactorily. Safety and hand hygiene in the exercise area reviewed with patient. Patient voices understanding of the information reviewed. Department expectations discussed with patient and achievable goals were set. The patient shows enthusiasm about attending the program and we look forward to working with this nice gentleman. The patient is scheduled for a 6 min walk test on Thursday 4/26 and to begin exercise on Thursday 04/06/17 at 10:30.   45 minutes was spent on a variety of activities such as assessment of the patient, obtaining baseline data including height, weight, BMI, and grip strength, verifying medical history, allergies, and current medications, and teaching patient strategies for performing tasks with less respiratory effort with emphasis on pursed lip breathing.

## 2017-03-30 ENCOUNTER — Encounter (HOSPITAL_COMMUNITY)
Admission: RE | Admit: 2017-03-30 | Discharge: 2017-03-30 | Disposition: A | Discharge status: Home / Self Care | Payer: Medicare Other | Source: Ambulatory Visit | Attending: Internal Medicine | Admitting: Internal Medicine

## 2017-03-30 DIAGNOSIS — J84112 Idiopathic pulmonary fibrosis: Secondary | ICD-10-CM | POA: Diagnosis not present

## 2017-03-31 NOTE — Progress Notes (Signed)
Pulmonary Individual Treatment Plan  Patient Details  Name: Shane [name] MRN: [id number] Date of Birth: [birth year] Referring Provider:     Pulmonary Rehab Walk Test from 03/30/2017 in [place]  Referring Provider  Dr. Chase Caller      Initial Encounter Date:    Pulmonary Rehab Walk Test from 03/30/2017 in [place]  Date  03/31/17  Referring Provider  Dr. Chase Caller      Visit Diagnosis: 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[place])  Patient's Home Medications on Admission:   Current Outpatient Prescriptions:  .  acetaminophen (TYLENOL) 500 MG tablet, Take 500 mg by mouth every 6 (six) hours as needed for moderate pain., Disp: , Rfl:  .  Ascorbic Acid (VITAMIN C) 1000 MG tablet, Take 1,000 mg by mouth daily., Disp: , Rfl:  .  aspirin 81 MG tablet, Take 81 mg by mouth daily.  , Disp: , Rfl:  .  clotrimazole-betamethasone (LOTRISONE) cream, APPLY TO AFFECTED AREA TWICE A DAY, Disp: , Rfl: 0 .  diazepam (VALIUM) 5 MG tablet, Take 5 mg by mouth at bedtime as needed (for sleep). , Disp: , Rfl:  .  furosemide (LASIX) 40 MG tablet, Take 40 mg by mouth daily. , Disp: , Rfl:  .  insulin detemir (LEVEMIR) 100 UNIT/ML injection, Inject 22 Units into the skin at bedtime., Disp: , Rfl:  .  insulin lispro (HUMALOG KWIKPEN) 100 UNIT/ML injection, Inject 20 Units into the skin 3 (three) times daily before meals. Sliding scale, Disp: , Rfl:  .  Insulin Pump Disposable (V-GO 40) KIT, 40 Units by Does not apply route daily. humalog 100 units dispensed every 24 hours. Dispose v-go bag after 24 hours of use. , Disp: , Rfl:  .  KLOR-CON M20 20 MEQ tablet, Take 20 mEq by mouth daily. , Disp: , Rfl:  .  lisinopril (PRINIVIL,ZESTRIL) 20 MG tablet, Take 20 mg by mouth daily. , Disp: , Rfl:  .  Multiple Vitamin (MULTIVITAMIN) tablet, Take 1 tablet by mouth daily.  , Disp: , Rfl:  .  nebivolol (BYSTOLIC) 5 MG tablet, Take 5 mg by mouth daily., Disp: ,  Rfl:  .  nitroGLYCERIN (NITROSTAT) 0.4 MG SL tablet, Place 0.4 mg under the tongue every 5 (five) minutes as needed. For chest pain, Disp: , Rfl:  .  pantoprazole (PROTONIX) 40 MG tablet, Take 40 mg by mouth daily.  , Disp: , Rfl:  .  Pirfenidone (ESBRIET) 267 MG CAPS, Take 3 capsules by mouth 3 (three) times daily., Disp: 270 capsule, Rfl: 5 .  PROAIR HFA 108 (90 Base) MCG/ACT inhaler, Inhale 2 puffs into the lungs every 4 (four) hours as needed for wheezing or shortness of breath. , Disp: , Rfl:  .  promethazine-phenylephrine (PROMETHAZINE VC PLAIN) 6.25-5 MG/5ML SYRP, Take 5 mLs by mouth every 4 (four) hours as needed for congestion., Disp: , Rfl:  .  Rosuvastatin Calcium (CRESTOR PO), Take by mouth daily., Disp: , Rfl:   Past Medical History: Past Medical History:  Diagnosis Date  . 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 Cataract   . Chest pain   . Chronic kidney disease    d/t DM  . Diabetes mellitus    Vgo disposal insulin bolus  simular to insulin pump  . Dyspnea   . GERD (gastroesophageal reflux disease)   . Hyperlipidemia   . Hypertension   . Neuromuscular disorder ([place])   . PONV (postoperative nausea and vomiting)   .  Sleep apnea     uses cpap asked to bring mask and tubing    Tobacco Use: History  Smoking Status  . Never Smoker  Smokeless Tobacco  . Never Used    Labs: Recent Review Flowsheet Data    Labs for ITP Cardiac and Pulmonary Rehab Latest Ref Rng & Units 11/09/2016 11/09/2016 12/19/2016 12/26/2016 12/27/2016   Cholestrol 0 - 200 mg/dL - - - - -   LDLCALC 0 - 99 mg/dL - - - - -   HDL >81 mg/dL - - - - -   Trlycerides <150 mg/dL - - - - -   Hemoglobin A1c 4.8 - 5.6 % - - 7.9(H) - -   PHART 7.350 - 7.450 - - TEST WILL BE CREDITED 7.376 7.362   PCO2ART 32.0 - 48.0 mmHg - - TEST WILL BE CREDITED 44.0 45.4   HCO3 20.0 - 28.0 mmol/L 28.0 30.0(H) TEST WILL BE CREDITED 25.2 25.1   TCO2 0 - 100 mmol/L 29 31 - - -   ACIDBASEDEF 0.0 - 2.0 mmol/L - - TEST WILL BE CREDITED - -    O2SAT % 59.0 61.0 TEST WILL BE CREDITED 93.9 95.7      Capillary Blood Glucose: Lab Results  Component Value Date   GLUCAP 191 (H) 12/29/2016   GLUCAP 243 (H) 12/28/2016   GLUCAP 233 (H) 12/28/2016   GLUCAP 238 (H) 12/28/2016   GLUCAP 270 (H) 12/28/2016     ADL UCSD:   Pulmonary Function Assessment:     Pulmonary Function Assessment - 03/27/17 1108      Breath   Bilateral Breath Sounds Other  fine crackles in lower lung fields bilat   Shortness of Breath Yes;Limiting activity      Exercise Target Goals: Date: 03/31/17  Exercise Program Goal: Individual exercise prescription set with THRR, safety & activity barriers. Participant demonstrates ability to understand and report RPE using BORG scale, to self-measure pulse accurately, and to acknowledge the importance of the exercise prescription.  Exercise Prescription Goal: Starting with aerobic activity 30 plus minutes a day, 3 days per week for initial exercise prescription. Provide home exercise prescription and guidelines that participant acknowledges understanding prior to discharge.  Activity Barriers & Risk Stratification:     Activity Barriers & Cardiac Risk Stratification - 03/27/17 1031      Activity Barriers & Cardiac Risk Stratification   Activity Barriers Shortness of Breath;Neck/Spine Problems;Back Problems;Balance Concerns;History of Falls;Assistive Device;Deconditioning      6 Minute Walk:     6 Minute Walk    Row Name 03/31/17 1104         6 Minute Walk   Phase Initial     Distance 600 feet     Walk Time 6 minutes     # of Rest Breaks 0     MPH 1.13     METS 1.84     RPE 14     Perceived Dyspnea  3     Symptoms Yes (comment)     Comments foot drag, unstable even with assistive device     Resting HR 72 bpm     Resting BP 104/98     Max Ex. HR 85 bpm     Max Ex. BP 134/50       Interval HR   Baseline HR 72     1 Minute HR 84     2 Minute HR 80     3 Minute HR 85     4  Minute HR  95     5 Minute HR 85     6 Minute HR 81     2 Minute Post HR 74     Interval Heart Rate? Yes       Interval Oxygen   Interval Oxygen? Yes     Baseline Oxygen Saturation % 97 %     Baseline Liters of Oxygen 0 L     1 Minute Oxygen Saturation % 94 %     1 Minute Liters of Oxygen 0 L     2 Minute Oxygen Saturation % 91 %     2 Minute Liters of Oxygen 0 L     3 Minute Oxygen Saturation % 93 %     3 Minute Liters of Oxygen 0 L     4 Minute Oxygen Saturation % 93 %     4 Minute Liters of Oxygen 0 L     5 Minute Oxygen Saturation % 93 %     5 Minute Liters of Oxygen 0 L     6 Minute Oxygen Saturation % 95 %     6 Minute Liters of Oxygen 0 L     2 Minute Post Oxygen Saturation % 97 %     2 Minute Post Liters of Oxygen 0 L        Oxygen Initial Assessment:     Oxygen Initial Assessment - 03/27/17 1031      Home Oxygen   Home Oxygen Device None   Sleep Oxygen Prescription None   Home Exercise Oxygen Prescription None   Home at Rest Exercise Oxygen Prescription None      Oxygen Re-Evaluation:   Oxygen Discharge (Final Oxygen Re-Evaluation):   Initial Exercise Prescription:     Initial Exercise Prescription - 03/31/17 1100      Date of Initial Exercise RX and Referring Provider   Date 03/31/17   Referring Provider Dr. Chase Caller     NuStep   Level 2   Minutes 34   METs 1.5     Stairs   Sets 5   Minutes 17     Prescription Details   Frequency (times per week) 2   Duration Progress to 45 minutes of aerobic exercise without signs/symptoms of physical distress     Intensity   THRR 40-80% of Max Heartrate 58-116   Ratings of Perceived Exertion 11-13   Perceived Dyspnea 0-4     Progression   Progression Continue progressive overload as per policy without signs/symptoms or physical distress.     Resistance Training   Training Prescription Yes   Weight blue bands   Reps 10-15      Perform Capillary Blood Glucose checks as needed.  Exercise Prescription  Changes:   Exercise Comments:   Exercise Goals and Review:     Exercise Goals    Row Name 03/27/17 1034             Exercise Goals   Increase Physical Activity Yes       Intervention Provide advice, education, support and counseling about physical activity/exercise needs.;Develop an individualized exercise prescription for aerobic and resistive training based on initial evaluation findings, risk stratification, comorbidities and participant's personal goals.       Expected Outcomes Achievement of increased cardiorespiratory fitness and enhanced flexibility, muscular endurance and strength shown through measurements of functional capacity and personal statement of participant.       Increase Strength and Stamina Yes  Intervention Provide advice, education, support and counseling about physical activity/exercise needs.;Develop an individualized exercise prescription for aerobic and resistive training based on initial evaluation findings, risk stratification, comorbidities and participant's personal goals.       Expected Outcomes Achievement of increased cardiorespiratory fitness and enhanced flexibility, muscular endurance and strength shown through measurements of functional capacity and personal statement of participant.          Exercise Goals Re-Evaluation :   Discharge Exercise Prescription (Final Exercise Prescription Changes):   Nutrition:  Target Goals: Understanding of nutrition guidelines, daily intake of sodium '1500mg'$ , cholesterol '200mg'$ , calories 30% from fat and 7% or less from saturated fats, daily to have 5 or more servings of fruits and vegetables.  Biometrics:     Pre Biometrics - 03/27/17 1034      Pre Biometrics   Grip Strength 30 kg       Nutrition Therapy Plan and Nutrition Goals:   Nutrition Discharge: Rate Your Plate Scores:   Nutrition Goals Re-Evaluation:   Nutrition Goals Discharge (Final Nutrition Goals  Re-Evaluation):   Psychosocial: Target Goals: Acknowledge presence or absence of significant depression and/or stress, maximize coping skills, provide positive support system. Participant is able to verbalize types and ability to use techniques and skills needed for reducing stress and depression.  Initial Review & Psychosocial Screening:     Initial Psych Review & Screening - 03/27/17 1056      Initial Review   Current issues with None Identified     Family Dynamics   Good Support System? Yes     Barriers   Psychosocial barriers to participate in program There are no identifiable barriers or psychosocial needs.     Screening Interventions   Interventions Encouraged to exercise      Quality of Life Scores:   PHQ-9: Recent Review Flowsheet Data    Depression screen [facility] 2/9 03/27/2017   Decreased Interest 0   Down, Depressed, Hopeless 0   PHQ - 2 Score 0     Interpretation of Total Score  Total Score Depression Severity:  1-4 = Minimal depression, 5-9 = Mild depression, 10-14 = Moderate depression, 15-19 = Moderately severe depression, 20-27 = Severe depression   Psychosocial Evaluation and Intervention:     Psychosocial Evaluation - 03/27/17 1056      Psychosocial Evaluation & Interventions   Interventions Encouraged to exercise with the program and follow exercise prescription   Continue Psychosocial Services  No Follow up required      Psychosocial Re-Evaluation:   Psychosocial Discharge (Final Psychosocial Re-Evaluation):   Education: Education Goals: Education classes will be provided on a weekly basis, covering required topics. Participant will state understanding/return demonstration of topics presented.  Learning Barriers/Preferences:     Learning Barriers/Preferences - 03/27/17 1055      Learning Barriers/Preferences   Learning Barriers None   Learning Preferences Computer/Internet;Group Instruction;Verbal Instruction;Written  Material;Individual Instruction      Education Topics: Risk Factor Reduction:  -Group instruction that is supported by a PowerPoint presentation. Instructor discusses the definition of a risk factor, different risk factors for pulmonary disease, and how the heart and lungs work together.     Nutrition for Pulmonary Patient:  -Group instruction provided by PowerPoint slides, verbal discussion, and written materials to support subject matter. The instructor gives an explanation and review of healthy diet recommendations, which includes a discussion on weight management, recommendations for fruit and vegetable consumption, as well as protein, fluid, caffeine, fiber, sodium, sugar, and alcohol. Tips for  eating when patients are short of breath are discussed.   Pursed Lip Breathing:  -Group instruction that is supported by demonstration and informational handouts. Instructor discusses the benefits of pursed lip and diaphragmatic breathing and detailed demonstration on how to preform both.     Oxygen Safety:  -Group instruction provided by PowerPoint, verbal discussion, and written material to support subject matter. There is an overview of "What is Oxygen" and "Why do we need it".  Instructor also reviews how to create a safe environment for oxygen use, the importance of using oxygen as prescribed, and the risks of noncompliance. There is a brief discussion on traveling with oxygen and resources the patient may utilize.   Oxygen Equipment:  -Group instruction provided by [facility] Staff utilizing handouts, written materials, and equipment demonstrations.   Signs and Symptoms:  -Group instruction provided by written material and verbal discussion to support subject matter. Warning signs and symptoms of infection, stroke, and heart attack are reviewed and when to call the physician/911 reinforced. Tips for preventing the spread of infection discussed.   Advanced Directives:  -Group  instruction provided by verbal instruction and written material to support subject matter. Instructor reviews Advanced Directive laws and proper instruction for filling out document.   Pulmonary Video:  -Group video education that reviews the importance of medication and oxygen compliance, exercise, good nutrition, pulmonary hygiene, and pursed lip and diaphragmatic breathing for the pulmonary patient.   Exercise for the Pulmonary Patient:  -Group instruction that is supported by a PowerPoint presentation. Instructor discusses benefits of exercise, core components of exercise, frequency, duration, and intensity of an exercise routine, importance of utilizing pulse oximetry during exercise, safety while exercising, and options of places to exercise outside of rehab.     Pulmonary Medications:  -Verbally interactive group education provided by instructor with focus on inhaled medications and proper administration.   Anatomy and Physiology of the Respiratory System and Intimacy:  -Group instruction provided by PowerPoint, verbal discussion, and written material to support subject matter. Instructor reviews respiratory cycle and anatomical components of the respiratory system and their functions. Instructor also reviews differences in obstructive and restrictive respiratory diseases with examples of each. Intimacy, Sex, and Sexuality differences are reviewed with a discussion on how relationships can change when diagnosed with pulmonary disease. Common sexual concerns are reviewed.   Knowledge Questionnaire Score:   Core Components/Risk Factors/Patient Goals at Admission:     Personal Goals and Risk Factors at Admission - 03/27/17 1057      Core Components/Risk Factors/Patient Goals on Admission    Weight Management Obesity;Yes   Intervention Weight Management: Develop a combined nutrition and exercise program designed to reach desired caloric intake, while maintaining appropriate intake of  nutrient and fiber, sodium and fats, and appropriate energy expenditure required for the weight goal.;Obesity: Provide education and appropriate resources to help participant work on and attain dietary goals.;Weight Management/Obesity: Establish reasonable short term and long term weight goals.;Weight Management: Provide education and appropriate resources to help participant work on and attain dietary goals.   Expected Outcomes Short Term: Continue to assess and modify interventions until short term weight is achieved;Understanding of distribution of calorie intake throughout the day with the consumption of 4-5 meals/snacks;Understanding recommendations for meals to include 15-35% energy as protein, 25-35% energy from fat, 35-60% energy from carbohydrates, less than '200mg'$  of dietary cholesterol, 20-35 gm of total fiber daily;Weight Loss: Understanding of general recommendations for a balanced deficit meal plan, which promotes 1-2 lb weight loss  per week and includes a negative energy balance of [phone] kcal/d   Improve shortness of breath with ADL's Yes   Intervention Provide education, individualized exercise plan and daily activity instruction to help decrease symptoms of SOB with activities of daily living.   Expected Outcomes Short Term: Achieves a reduction of symptoms when performing activities of daily living.   Develop more efficient breathing techniques such as purse lipped breathing and diaphragmatic breathing; and practicing self-pacing with activity Yes   Intervention Provide education, demonstration and support about specific breathing techniuqes utilized for more efficient breathing. Include techniques such as pursed lipped breathing, diaphragmatic breathing and self-pacing activity.   Expected Outcomes Short Term: Participant will be able to demonstrate and use breathing techniques as needed throughout daily activities.   Diabetes Yes   Intervention Provide education about signs/symptoms  and action to take for hypo/hyperglycemia.;Provide education about proper nutrition, including hydration, and aerobic/resistive exercise prescription along with prescribed medications to achieve blood glucose in normal ranges: Fasting glucose 65-99 mg/dL   Expected Outcomes Short Term: Participant verbalizes understanding of the signs/symptoms and immediate care of hyper/hypoglycemia, proper foot care and importance of medication, aerobic/resistive exercise and nutrition plan for blood glucose control.;Long Term: Attainment of HbA1C < 7%.      Core Components/Risk Factors/Patient Goals Review:    Core Components/Risk Factors/Patient Goals at Discharge (Final Review):    ITP Comments:   Comments:

## 2017-04-06 ENCOUNTER — Ambulatory Visit: Payer: Medicare Other | Admitting: Internal Medicine

## 2017-04-06 ENCOUNTER — Ambulatory Visit (INDEPENDENT_AMBULATORY_CARE_PROVIDER_SITE_OTHER): Payer: Medicare Other | Admitting: Internal Medicine

## 2017-04-06 ENCOUNTER — Encounter (HOSPITAL_COMMUNITY)
Admission: RE | Admit: 2017-04-06 | Discharge: 2017-04-06 | Disposition: A | Discharge status: Home / Self Care | Payer: Medicare Other | Source: Ambulatory Visit | Attending: Internal Medicine | Admitting: Internal Medicine

## 2017-04-06 DIAGNOSIS — N189 Chronic kidney disease, unspecified: Secondary | ICD-10-CM | POA: Insufficient documentation

## 2017-04-06 DIAGNOSIS — Z79899 Other long term (current) drug therapy: Secondary | ICD-10-CM | POA: Insufficient documentation

## 2017-04-06 DIAGNOSIS — K219 Gastro-esophageal reflux disease without esophagitis: Secondary | ICD-10-CM | POA: Insufficient documentation

## 2017-04-06 DIAGNOSIS — G4730 Sleep apnea, unspecified: Secondary | ICD-10-CM | POA: Insufficient documentation

## 2017-04-06 DIAGNOSIS — Z7982 Long term (current) use of aspirin: Secondary | ICD-10-CM | POA: Insufficient documentation

## 2017-04-06 DIAGNOSIS — J84112 Idiopathic pulmonary fibrosis: Secondary | ICD-10-CM

## 2017-04-06 DIAGNOSIS — E785 Hyperlipidemia, unspecified: Secondary | ICD-10-CM | POA: Insufficient documentation

## 2017-04-06 DIAGNOSIS — Z794 Long term (current) use of insulin: Secondary | ICD-10-CM | POA: Insufficient documentation

## 2017-04-06 DIAGNOSIS — I129 Hypertensive chronic kidney disease with stage 1 through stage 4 chronic kidney disease, or unspecified chronic kidney disease: Secondary | ICD-10-CM | POA: Insufficient documentation

## 2017-04-06 DIAGNOSIS — E1122 Type 2 diabetes mellitus with diabetic chronic kidney disease: Secondary | ICD-10-CM | POA: Insufficient documentation

## 2017-04-06 LAB — PULMONARY FUNCTION TEST
DL/VA % pred: 99 %
DL/VA: 4.72 ml/min/mmHg/L
DLCO cor % pred: 53 %
DLCO cor: 19.35 ml/min/mmHg
DLCO unc % pred: 45 %
DLCO unc: 16.43 ml/min/mmHg
FEF 25-75 Pre: 4.15 L/sec
FEF2575-%Pred-Pre: 167 %
FEV1-%Pred-Pre: 78 %
FEV1-Pre: 2.69 L
FEV1FVC-%Pred-Pre: 126 %
FEV6-%Pred-Pre: 66 %
FEV6-Pre: 2.94 L
FEV6FVC-%Pred-Pre: 106 %
FVC-%Pred-Pre: 62 %
FVC-Pre: 2.94 L
Pre FEV1/FVC ratio: 91 %
Pre FEV6/FVC Ratio: 100 %

## 2017-04-06 NOTE — Progress Notes (Signed)
PFT done today. 

## 2017-04-11 ENCOUNTER — Encounter (HOSPITAL_COMMUNITY): Payer: Medicare Other

## 2017-04-11 ENCOUNTER — Encounter (HOSPITAL_COMMUNITY)
Admission: RE | Admit: 2017-04-11 | Discharge: 2017-04-11 | Disposition: A | Discharge status: Home / Self Care | Payer: Medicare Other | Source: Ambulatory Visit | Attending: Internal Medicine | Admitting: Internal Medicine

## 2017-04-11 VITALS — Wt 266.5 lb

## 2017-04-11 DIAGNOSIS — Z7982 Long term (current) use of aspirin: Secondary | ICD-10-CM | POA: Diagnosis not present

## 2017-04-11 DIAGNOSIS — I129 Hypertensive chronic kidney disease with stage 1 through stage 4 chronic kidney disease, or unspecified chronic kidney disease: Secondary | ICD-10-CM | POA: Diagnosis not present

## 2017-04-11 DIAGNOSIS — Z79899 Other long term (current) drug therapy: Secondary | ICD-10-CM | POA: Diagnosis not present

## 2017-04-11 DIAGNOSIS — E1122 Type 2 diabetes mellitus with diabetic chronic kidney disease: Secondary | ICD-10-CM | POA: Diagnosis not present

## 2017-04-11 DIAGNOSIS — N189 Chronic kidney disease, unspecified: Secondary | ICD-10-CM | POA: Diagnosis not present

## 2017-04-11 DIAGNOSIS — K219 Gastro-esophageal reflux disease without esophagitis: Secondary | ICD-10-CM | POA: Diagnosis not present

## 2017-04-11 DIAGNOSIS — Z794 Long term (current) use of insulin: Secondary | ICD-10-CM | POA: Diagnosis not present

## 2017-04-11 DIAGNOSIS — J84112 Idiopathic pulmonary fibrosis: Secondary | ICD-10-CM

## 2017-04-11 DIAGNOSIS — G4730 Sleep apnea, unspecified: Secondary | ICD-10-CM | POA: Diagnosis not present

## 2017-04-11 DIAGNOSIS — E785 Hyperlipidemia, unspecified: Secondary | ICD-10-CM | POA: Diagnosis not present

## 2017-04-11 NOTE — Progress Notes (Signed)
Daily Session Note  Patient Details  Name: Shane [name] MRN: [id number] Date of Birth: [birth year] Referring Provider:     Pulmonary Rehab Walk Test from 03/30/2017 in [place]  Referring Provider  Dr. Chase Caller      Encounter Date: 04/11/2017  Check In:     Session Check In - 04/11/17 1227      Check-In   Location [facility]   Staff Present Trish Fountain, RN, Maxcine Ham, RN, BSN;Molly diVincenzo, MS, ACSM RCEP, Exercise Physiologist;Lisa Ysidro Evert, RN   Supervising physician immediately available to respond to emergencies [facility] immediately available   Physician(s) Gr. Ghirmine   Medication changes reported     No   Fall or balance concerns reported    Yes   Comments --  Worked with him one on one on stairs   Tobacco Cessation No Change   Warm-up and Cool-down Performed as group-led Location manager Performed Yes   VAD Patient? No     Pain Assessment   Currently in Pain? No/denies   Multiple Pain Sites No      Capillary Blood Glucose: No results found for this or any previous visit (from the past 24 hour(s)).     POCT Glucose - 04/11/17 1237      POCT Blood Glucose   Pre-Exercise 163 mg/dL   Post-Exercise 155 mg/dL         Exercise Prescription Changes - 04/11/17 1200      Response to Exercise   Blood Pressure (Admit) 116/64   Blood Pressure (Exercise) 142/52   Blood Pressure (Exit) 108/64   Heart Rate (Admit) 63 bpm   Heart Rate (Exercise) 72 bpm   Heart Rate (Exit) 68 bpm   Oxygen Saturation (Admit) 95 %   Oxygen Saturation (Exercise) 95 %   Oxygen Saturation (Exit) 97 %   Rating of Perceived Exertion (Exercise) 15   Perceived Dyspnea (Exercise) 2   Duration Progress to 45 minutes of aerobic exercise without signs/symptoms of physical distress   Intensity Other (comment)  % of HRR-     Progression   Progression Continue to progress workloads to maintain intensity  without signs/symptoms of physical distress.     Resistance Training   Training Prescription Yes   Weight blue bands   Reps 10-15   Time 10 Minutes     NuStep   Level 2   Minutes 34   METs 1.9     Stairs   Minutes 17      History  Smoking Status  . Never Smoker  Smokeless Tobacco  . Never Used    Goals Met:  Exercise tolerated well No report of cardiac concerns or symptoms Strength training completed today  Goals Unmet:  Not Applicable  Comments: Service time is from 1030 to 1215    Dr. Rush Farmer is Medical Director for Pulmonary Rehab at [facility].

## 2017-04-11 NOTE — Progress Notes (Signed)
Worked 1:1 with patient today while working on the stair steps 11:30-11:15. We discussed the proper use of equipment and preventing falls in length.

## 2017-04-11 NOTE — Progress Notes (Signed)
Pulmonary Individual Treatment Plan  Patient Details  Name: Shane [name] MRN: [id number] Date of Birth: [birth year] Referring Provider:     Pulmonary Rehab Walk Test from 03/30/2017 in [place]  Referring Provider  Dr. Chase Caller      Initial Encounter Date:    Pulmonary Rehab Walk Test from 03/30/2017 in [place]  Date  03/31/17  Referring Provider  Dr. Chase Caller      Visit Diagnosis: 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[place])  Patient's Home Medications on Admission:   Current Outpatient Prescriptions:  .  acetaminophen (TYLENOL) 500 MG tablet, Take 500 mg by mouth every 6 (six) hours as needed for moderate pain., Disp: , Rfl:  .  Ascorbic Acid (VITAMIN C) 1000 MG tablet, Take 1,000 mg by mouth daily., Disp: , Rfl:  .  aspirin 81 MG tablet, Take 81 mg by mouth daily.  , Disp: , Rfl:  .  clotrimazole-betamethasone (LOTRISONE) cream, APPLY TO AFFECTED AREA TWICE A DAY, Disp: , Rfl: 0 .  diazepam (VALIUM) 5 MG tablet, Take 5 mg by mouth at bedtime as needed (for sleep). , Disp: , Rfl:  .  furosemide (LASIX) 40 MG tablet, Take 40 mg by mouth daily. , Disp: , Rfl:  .  insulin detemir (LEVEMIR) 100 UNIT/ML injection, Inject 22 Units into the skin at bedtime., Disp: , Rfl:  .  insulin lispro (HUMALOG KWIKPEN) 100 UNIT/ML injection, Inject 20 Units into the skin 3 (three) times daily before meals. Sliding scale, Disp: , Rfl:  .  Insulin Pump Disposable (V-GO 40) KIT, 40 Units by Does not apply route daily. humalog 100 units dispensed every 24 hours. Dispose v-go bag after 24 hours of use. , Disp: , Rfl:  .  KLOR-CON M20 20 MEQ tablet, Take 20 mEq by mouth daily. , Disp: , Rfl:  .  lisinopril (PRINIVIL,ZESTRIL) 20 MG tablet, Take 20 mg by mouth daily. , Disp: , Rfl:  .  Multiple Vitamin (MULTIVITAMIN) tablet, Take 1 tablet by mouth daily.  , Disp: , Rfl:  .  nebivolol (BYSTOLIC) 5 MG tablet, Take 5 mg by mouth daily., Disp: ,  Rfl:  .  nitroGLYCERIN (NITROSTAT) 0.4 MG SL tablet, Place 0.4 mg under the tongue every 5 (five) minutes as needed. For chest pain, Disp: , Rfl:  .  pantoprazole (PROTONIX) 40 MG tablet, Take 40 mg by mouth daily.  , Disp: , Rfl:  .  Pirfenidone (ESBRIET) 267 MG CAPS, Take 3 capsules by mouth 3 (three) times daily., Disp: 270 capsule, Rfl: 5 .  PROAIR HFA 108 (90 Base) MCG/ACT inhaler, Inhale 2 puffs into the lungs every 4 (four) hours as needed for wheezing or shortness of breath. , Disp: , Rfl:  .  promethazine-phenylephrine (PROMETHAZINE VC PLAIN) 6.25-5 MG/5ML SYRP, Take 5 mLs by mouth every 4 (four) hours as needed for congestion., Disp: , Rfl:  .  Rosuvastatin Calcium (CRESTOR PO), Take by mouth daily., Disp: , Rfl:   Past Medical History: Past Medical History:  Diagnosis Date  . 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 Cataract   . Chest pain   . Chronic kidney disease    d/t DM  . Diabetes mellitus    Vgo disposal insulin bolus  simular to insulin pump  . Dyspnea   . GERD (gastroesophageal reflux disease)   . Hyperlipidemia   . Hypertension   . Neuromuscular disorder ([place])   . PONV (postoperative nausea and vomiting)   .  Sleep apnea     uses cpap asked to bring mask and tubing    Tobacco Use: History  Smoking Status  . Never Smoker  Smokeless Tobacco  . Never Used    Labs: Recent Review Flowsheet Data    Labs for ITP Cardiac and Pulmonary Rehab Latest Ref Rng & Units 11/09/2016 11/09/2016 12/19/2016 12/26/2016 12/27/2016   Cholestrol 0 - 200 mg/dL - - - - -   LDLCALC 0 - 99 mg/dL - - - - -   HDL >39 mg/dL - - - - -   Trlycerides <150 mg/dL - - - - -   Hemoglobin A1c 4.8 - 5.6 % - - 7.9(H) - -   PHART 7.350 - 7.450 - - TEST WILL BE CREDITED 7.376 7.362   PCO2ART 32.0 - 48.0 mmHg - - TEST WILL BE CREDITED 44.0 45.4   HCO3 20.0 - 28.0 mmol/L 28.0 30.0(H) TEST WILL BE CREDITED 25.2 25.1   TCO2 0 - 100 mmol/L 29 31 - - -   ACIDBASEDEF 0.0 - 2.0 mmol/L - - TEST WILL BE CREDITED - -    O2SAT % 59.0 61.0 TEST WILL BE CREDITED 93.9 95.7      Capillary Blood Glucose: Lab Results  Component Value Date   GLUCAP 191 (H) 12/29/2016   GLUCAP 243 (H) 12/28/2016   GLUCAP 233 (H) 12/28/2016   GLUCAP 238 (H) 12/28/2016   GLUCAP 270 (H) 12/28/2016       POCT Glucose    Row Name 04/11/17 1237             POCT Blood Glucose   Pre-Exercise 163 mg/dL       Post-Exercise 155 mg/dL          ADL UCSD:   Pulmonary Function Assessment:     Pulmonary Function Assessment - 03/27/17 1108      Breath   Bilateral Breath Sounds Other  fine crackles in lower lung fields bilat   Shortness of Breath Yes;Limiting activity      Exercise Target Goals:    Exercise Program Goal: Individual exercise prescription set with THRR, safety & activity barriers. Participant demonstrates ability to understand and report RPE using BORG scale, to self-measure pulse accurately, and to acknowledge the importance of the exercise prescription.  Exercise Prescription Goal: Starting with aerobic activity 30 plus minutes a day, 3 days per week for initial exercise prescription. Provide home exercise prescription and guidelines that participant acknowledges understanding prior to discharge.  Activity Barriers & Risk Stratification:     Activity Barriers & Cardiac Risk Stratification - 03/27/17 1031      Activity Barriers & Cardiac Risk Stratification   Activity Barriers Shortness of Breath;Neck/Spine Problems;Back Problems;Balance Concerns;History of Falls;Assistive Device;Deconditioning      6 Minute Walk:     6 Minute Walk    Row Name 03/31/17 1104         6 Minute Walk   Phase Initial     Distance 600 feet     Walk Time 6 minutes     # of Rest Breaks 0     MPH 1.13     METS 1.84     RPE 14     Perceived Dyspnea  3     Symptoms Yes (comment)     Comments foot drag, unstable even with assistive device     Resting HR 72 bpm     Resting BP 104/98     Max Ex. HR 85 bpm  Max Ex. BP 134/50       Interval HR   Baseline HR 72     1 Minute HR 84     2 Minute HR 80     3 Minute HR 85     4 Minute HR 95     5 Minute HR 85     6 Minute HR 81     2 Minute Post HR 74     Interval Heart Rate? Yes       Interval Oxygen   Interval Oxygen? Yes     Baseline Oxygen Saturation % 97 %     Baseline Liters of Oxygen 0 L     1 Minute Oxygen Saturation % 94 %     1 Minute Liters of Oxygen 0 L     2 Minute Oxygen Saturation % 91 %     2 Minute Liters of Oxygen 0 L     3 Minute Oxygen Saturation % 93 %     3 Minute Liters of Oxygen 0 L     4 Minute Oxygen Saturation % 93 %     4 Minute Liters of Oxygen 0 L     5 Minute Oxygen Saturation % 93 %     5 Minute Liters of Oxygen 0 L     6 Minute Oxygen Saturation % 95 %     6 Minute Liters of Oxygen 0 L     2 Minute Post Oxygen Saturation % 97 %     2 Minute Post Liters of Oxygen 0 L        Oxygen Initial Assessment:     Oxygen Initial Assessment - 03/31/17 1113      Initial 6 min Walk   Oxygen Used None   Resting Oxygen Saturation  during 6 min walk 91 %     Program Oxygen Prescription   Program Oxygen Prescription None      Oxygen Re-Evaluation:     Oxygen Re-Evaluation    Row Name 04/11/17 1447             Program Oxygen Prescription   Program Oxygen Prescription None         Home Oxygen   Home Oxygen Device None          Oxygen Discharge (Final Oxygen Re-Evaluation):     Oxygen Re-Evaluation - 04/11/17 1447      Program Oxygen Prescription   Program Oxygen Prescription None     Home Oxygen   Home Oxygen Device None      Initial Exercise Prescription:     Initial Exercise Prescription - 03/31/17 1100      Date of Initial Exercise RX and Referring Provider   Date 03/31/17   Referring Provider Dr. Marchelle Gearing     NuStep   Level 2   Minutes 34   METs 1.5     Stairs   Sets 5   Minutes 17     Prescription Details   Frequency (times per week) 2   Duration Progress  to 45 minutes of aerobic exercise without signs/symptoms of physical distress     Intensity   THRR 40-80% of Max Heartrate 58-116   Ratings of Perceived Exertion 11-13   Perceived Dyspnea 0-4     Progression   Progression Continue progressive overload as per policy without signs/symptoms or physical distress.     Resistance Training   Training Prescription Yes   Weight blue bands   Reps 10-15  Perform Capillary Blood Glucose checks as needed.  Exercise Prescription Changes:     Exercise Prescription Changes    Row Name 04/11/17 1200             Response to Exercise   Blood Pressure (Admit) 116/64       Blood Pressure (Exercise) 142/52       Blood Pressure (Exit) 108/64       Heart Rate (Admit) 63 bpm       Heart Rate (Exercise) 72 bpm       Heart Rate (Exit) 68 bpm       Oxygen Saturation (Admit) 95 %       Oxygen Saturation (Exercise) 95 %       Oxygen Saturation (Exit) 97 %       Rating of Perceived Exertion (Exercise) 15       Perceived Dyspnea (Exercise) 2       Duration Progress to 45 minutes of aerobic exercise without signs/symptoms of physical distress       Intensity Other (comment)  % of HRR-         Progression   Progression Continue to progress workloads to maintain intensity without signs/symptoms of physical distress.         Resistance Training   Training Prescription Yes       Weight blue bands       Reps 10-15       Time 10 Minutes         NuStep   Level 2       Minutes 34       METs 1.9         Stairs   Minutes 17          Exercise Comments:   Exercise Goals and Review:     Exercise Goals    Row Name 03/27/17 1034             Exercise Goals   Increase Physical Activity Yes       Intervention Provide advice, education, support and counseling about physical activity/exercise needs.;Develop an individualized exercise prescription for aerobic and resistive training based on initial evaluation findings, risk  stratification, comorbidities and participant's personal goals.       Expected Outcomes Achievement of increased cardiorespiratory fitness and enhanced flexibility, muscular endurance and strength shown through measurements of functional capacity and personal statement of participant.       Increase Strength and Stamina Yes       Intervention Provide advice, education, support and counseling about physical activity/exercise needs.;Develop an individualized exercise prescription for aerobic and resistive training based on initial evaluation findings, risk stratification, comorbidities and participant's personal goals.       Expected Outcomes Achievement of increased cardiorespiratory fitness and enhanced flexibility, muscular endurance and strength shown through measurements of functional capacity and personal statement of participant.          Exercise Goals Re-Evaluation :   Discharge Exercise Prescription (Final Exercise Prescription Changes):     Exercise Prescription Changes - 04/11/17 1200      Response to Exercise   Blood Pressure (Admit) 116/64   Blood Pressure (Exercise) 142/52   Blood Pressure (Exit) 108/64   Heart Rate (Admit) 63 bpm   Heart Rate (Exercise) 72 bpm   Heart Rate (Exit) 68 bpm   Oxygen Saturation (Admit) 95 %   Oxygen Saturation (Exercise) 95 %   Oxygen Saturation (Exit) 97 %   Rating of Perceived Exertion (Exercise)  15   Perceived Dyspnea (Exercise) 2   Duration Progress to 45 minutes of aerobic exercise without signs/symptoms of physical distress   Intensity Other (comment)  % of HRR-     Progression   Progression Continue to progress workloads to maintain intensity without signs/symptoms of physical distress.     Resistance Training   Training Prescription Yes   Weight blue bands   Reps 10-15   Time 10 Minutes     NuStep   Level 2   Minutes 34   METs 1.9     Stairs   Minutes 17      Nutrition:  Target Goals: Understanding of nutrition  guidelines, daily intake of sodium '1500mg'$ , cholesterol '200mg'$ , calories 30% from fat and 7% or less from saturated fats, daily to have 5 or more servings of fruits and vegetables.  Biometrics:     Pre Biometrics - 03/27/17 1034      Pre Biometrics   Grip Strength 30 kg       Nutrition Therapy Plan and Nutrition Goals:   Nutrition Discharge: Rate Your Plate Scores:   Nutrition Goals Re-Evaluation:   Nutrition Goals Discharge (Final Nutrition Goals Re-Evaluation):   Psychosocial: Target Goals: Acknowledge presence or absence of significant depression and/or stress, maximize coping skills, provide positive support system. Participant is able to verbalize types and ability to use techniques and skills needed for reducing stress and depression.  Initial Review & Psychosocial Screening:     Initial Psych Review & Screening - 03/27/17 1056      Initial Review   Current issues with None Identified     Family Dynamics   Good Support System? Yes     Barriers   Psychosocial barriers to participate in program There are no identifiable barriers or psychosocial needs.     Screening Interventions   Interventions Encouraged to exercise      Quality of Life Scores:   PHQ-9: Recent Review Flowsheet Data    Depression screen [facility] 2/9 03/27/2017   Decreased Interest 0   Down, Depressed, Hopeless 0   PHQ - 2 Score 0     Interpretation of Total Score  Total Score Depression Severity:  1-4 = Minimal depression, 5-9 = Mild depression, 10-14 = Moderate depression, 15-19 = Moderately severe depression, 20-27 = Severe depression   Psychosocial Evaluation and Intervention:     Psychosocial Evaluation - 03/27/17 1056      Psychosocial Evaluation & Interventions   Interventions Encouraged to exercise with the program and follow exercise prescription   Continue Psychosocial Services  No Follow up required      Psychosocial Re-Evaluation:   Psychosocial Discharge (Final  Psychosocial Re-Evaluation):   Education: Education Goals: Education classes will be provided on a weekly basis, covering required topics. Participant will state understanding/return demonstration of topics presented.  Learning Barriers/Preferences:     Learning Barriers/Preferences - 03/27/17 1055      Learning Barriers/Preferences   Learning Barriers None   Learning Preferences Computer/Internet;Group Instruction;Verbal Instruction;Written Material;Individual Instruction      Education Topics: Risk Factor Reduction:  -Group instruction that is supported by a PowerPoint presentation. Instructor discusses the definition of a risk factor, different risk factors for pulmonary disease, and how the heart and lungs work together.     Nutrition for Pulmonary Patient:  -Group instruction provided by PowerPoint slides, verbal discussion, and written materials to support subject matter. The instructor gives an explanation and review of healthy diet recommendations, which includes a discussion on weight  management, recommendations for fruit and vegetable consumption, as well as protein, fluid, caffeine, fiber, sodium, sugar, and alcohol. Tips for eating when patients are short of breath are discussed.   Pursed Lip Breathing:  -Group instruction that is supported by demonstration and informational handouts. Instructor discusses the benefits of pursed lip and diaphragmatic breathing and detailed demonstration on how to preform both.     Oxygen Safety:  -Group instruction provided by PowerPoint, verbal discussion, and written material to support subject matter. There is an overview of "What is Oxygen" and "Why do we need it".  Instructor also reviews how to create a safe environment for oxygen use, the importance of using oxygen as prescribed, and the risks of noncompliance. There is a brief discussion on traveling with oxygen and resources the patient may utilize.   Oxygen Equipment:  -Group  instruction provided by [facility] Staff utilizing handouts, written materials, and equipment demonstrations.   Signs and Symptoms:  -Group instruction provided by written material and verbal discussion to support subject matter. Warning signs and symptoms of infection, stroke, and heart attack are reviewed and when to call the physician/911 reinforced. Tips for preventing the spread of infection discussed.   PULMONARY REHAB OTHER RESPIRATORY from 04/06/2017 in [place]  Date  04/06/17  Educator  rn  Instruction Review Code  2- meets goals/outcomes      Advanced Directives:  -Group instruction provided by verbal instruction and written material to support subject matter. Instructor reviews Advanced Directive laws and proper instruction for filling out document.   Pulmonary Video:  -Group video education that reviews the importance of medication and oxygen compliance, exercise, good nutrition, pulmonary hygiene, and pursed lip and diaphragmatic breathing for the pulmonary patient.   Exercise for the Pulmonary Patient:  -Group instruction that is supported by a PowerPoint presentation. Instructor discusses benefits of exercise, core components of exercise, frequency, duration, and intensity of an exercise routine, importance of utilizing pulse oximetry during exercise, safety while exercising, and options of places to exercise outside of rehab.     Pulmonary Medications:  -Verbally interactive group education provided by instructor with focus on inhaled medications and proper administration.   Anatomy and Physiology of the Respiratory System and Intimacy:  -Group instruction provided by PowerPoint, verbal discussion, and written material to support subject matter. Instructor reviews respiratory cycle and anatomical components of the respiratory system and their functions. Instructor also reviews differences in obstructive and restrictive respiratory  diseases with examples of each. Intimacy, Sex, and Sexuality differences are reviewed with a discussion on how relationships can change when diagnosed with pulmonary disease. Common sexual concerns are reviewed.   Knowledge Questionnaire Score:   Core Components/Risk Factors/Patient Goals at Admission:     Personal Goals and Risk Factors at Admission - 03/27/17 1057      Core Components/Risk Factors/Patient Goals on Admission    Weight Management Obesity;Yes   Intervention Weight Management: Develop a combined nutrition and exercise program designed to reach desired caloric intake, while maintaining appropriate intake of nutrient and fiber, sodium and fats, and appropriate energy expenditure required for the weight goal.;Obesity: Provide education and appropriate resources to help participant work on and attain dietary goals.;Weight Management/Obesity: Establish reasonable short term and long term weight goals.;Weight Management: Provide education and appropriate resources to help participant work on and attain dietary goals.   Expected Outcomes Short Term: Continue to assess and modify interventions until short term weight is achieved;Understanding of distribution of calorie intake throughout  the day with the consumption of 4-5 meals/snacks;Understanding recommendations for meals to include 15-35% energy as protein, 25-35% energy from fat, 35-60% energy from carbohydrates, less than '200mg'$  of dietary cholesterol, 20-35 gm of total fiber daily;Weight Loss: Understanding of general recommendations for a balanced deficit meal plan, which promotes 1-2 lb weight loss per week and includes a negative energy balance of [phone] kcal/d   Improve shortness of breath with ADL's Yes   Intervention Provide education, individualized exercise plan and daily activity instruction to help decrease symptoms of SOB with activities of daily living.   Expected Outcomes Short Term: Achieves a reduction of symptoms when  performing activities of daily living.   Develop more efficient breathing techniques such as purse lipped breathing and diaphragmatic breathing; and practicing self-pacing with activity Yes   Intervention Provide education, demonstration and support about specific breathing techniuqes utilized for more efficient breathing. Include techniques such as pursed lipped breathing, diaphragmatic breathing and self-pacing activity.   Expected Outcomes Short Term: Participant will be able to demonstrate and use breathing techniques as needed throughout daily activities.   Diabetes Yes   Intervention Provide education about signs/symptoms and action to take for hypo/hyperglycemia.;Provide education about proper nutrition, including hydration, and aerobic/resistive exercise prescription along with prescribed medications to achieve blood glucose in normal ranges: Fasting glucose 65-99 mg/dL   Expected Outcomes Short Term: Participant verbalizes understanding of the signs/symptoms and immediate care of hyper/hypoglycemia, proper foot care and importance of medication, aerobic/resistive exercise and nutrition plan for blood glucose control.;Long Term: Attainment of HbA1C < 7%.      Core Components/Risk Factors/Patient Goals Review:    Core Components/Risk Factors/Patient Goals at Discharge (Final Review):    ITP Comments:   Comments: ITP REVIEW Today was Mr. Tall first day of exercise. Unable to evaluate goals. Anticipate progression towards program goals over the next 30 days. Recommend continued exercise, life style modification, education, and utilization of breathing techniques to increase stamina and strength and decrease shortness of breath with exertion.

## 2017-04-13 ENCOUNTER — Encounter (HOSPITAL_COMMUNITY): Payer: Medicare Other

## 2017-04-13 ENCOUNTER — Encounter (HOSPITAL_COMMUNITY)
Admission: RE | Admit: 2017-04-13 | Discharge: 2017-04-13 | Disposition: A | Discharge status: Home / Self Care | Payer: Medicare Other | Source: Ambulatory Visit | Attending: Internal Medicine | Admitting: Internal Medicine

## 2017-04-13 VITALS — Wt 267.6 lb

## 2017-04-13 DIAGNOSIS — J84112 Idiopathic pulmonary fibrosis: Secondary | ICD-10-CM

## 2017-04-13 NOTE — Progress Notes (Signed)
ATC pt x1 - went to voicemail but unable to LM because mailbox is full.  WCB.

## 2017-04-13 NOTE — Progress Notes (Signed)
Daily Session Note  Patient Details  Name: Shane [name] MRN: [id number] Date of Birth: Sep 06, 1941 Referring Provider:     Pulmonary Rehab Walk Test from 03/30/2017 in [place]  Referring Provider  Dr. Chase Caller      Encounter Date: 04/13/2017  Check In:     Session Check In - 04/13/17 1030      Check-In   Location [facility]   Staff Present Rosebud Poles, RN, BSN;Lisa Ysidro Evert, RN;Portia Rollene Rotunda, RN, BSN   Supervising physician immediately available to respond to emergencies [facility] immediately available   Physician(s) Dr. Wendee Beavers   Medication changes reported     No   Fall or balance concerns reported    No   Tobacco Cessation No Change   Warm-up and Cool-down Performed as group-led instruction   Resistance Training Performed Yes   VAD Patient? No     Pain Assessment   Currently in Pain? No/denies   Multiple Pain Sites No      Capillary Blood Glucose: No results found for this or any previous visit (from the past 24 hour(s)).      Exercise Prescription Changes - 04/13/17 1200      Response to Exercise   Blood Pressure (Admit) 104/56   Blood Pressure (Exercise) 142/52   Blood Pressure (Exit) 114/58   Heart Rate (Admit) 56 bpm   Heart Rate (Exercise) 65 bpm   Heart Rate (Exit) 57 bpm   Oxygen Saturation (Admit) 98 %   Oxygen Saturation (Exercise) 99 %   Oxygen Saturation (Exit) 99 %   Rating of Perceived Exertion (Exercise) 12   Perceived Dyspnea (Exercise) 1   Duration Progress to 45 minutes of aerobic exercise without signs/symptoms of physical distress   Intensity THRR unchanged     Progression   Progression Continue to progress workloads to maintain intensity without signs/symptoms of physical distress.     Resistance Training   Training Prescription Yes   Weight blue bands   Reps 10-15   Time 10 Minutes     Interval Training   Interval Training No     NuStep   Level 2   Minutes 34   METs  1.8      History  Smoking Status  . Never Smoker  Smokeless Tobacco  . Never Used    Goals Met:  Exercise tolerated well Strength training completed today  Goals Unmet:  Not Applicable  Comments: Service time is from 1030 to 1230    Dr. Rush Farmer is Medical Director for Pulmonary Rehab at [facility].

## 2017-04-14 NOTE — Progress Notes (Signed)
ATC to mobile number, mailbox is full. ATC home number, line continued to ring without VM. WCB.

## 2017-04-18 ENCOUNTER — Encounter (HOSPITAL_COMMUNITY): Payer: Medicare Other

## 2017-04-18 ENCOUNTER — Encounter (HOSPITAL_COMMUNITY)
Admission: RE | Admit: 2017-04-18 | Discharge: 2017-04-18 | Disposition: A | Discharge status: Home / Self Care | Payer: Medicare Other | Source: Ambulatory Visit | Attending: Internal Medicine | Admitting: Internal Medicine

## 2017-04-18 VITALS — Wt 266.1 lb

## 2017-04-18 DIAGNOSIS — J84112 Idiopathic pulmonary fibrosis: Secondary | ICD-10-CM

## 2017-04-18 NOTE — Progress Notes (Signed)
Daily Session Note  Patient Details  Name: Shane [name] MRN: [id number] Date of Birth: February 20, 1941 Referring Provider:     Pulmonary Rehab Walk Test from 03/30/2017 in [place]  Referring Provider  Dr. Chase Caller      Encounter Date: 04/18/2017  Check In:     Session Check In - 04/18/17 1025      Check-In   Location [facility]   Staff Present Rosebud Poles, RN, BSN;Molly diVincenzo, MS, ACSM RCEP, Exercise Physiologist;Portia Rollene Rotunda, RN, Roque Cash, RN   Supervising physician immediately available to respond to emergencies [facility] immediately available   Physician(s) Dr. Wendee Beavers   Medication changes reported     No   Fall or balance concerns reported    No   Tobacco Cessation No Change   Warm-up and Cool-down Performed as group-led instruction   Resistance Training Performed Yes   VAD Patient? No     Pain Assessment   Currently in Pain? No/denies   Multiple Pain Sites No      Capillary Blood Glucose: No results found for this or any previous visit (from the past 24 hour(s)).      Exercise Prescription Changes - 04/18/17 1200      Response to Exercise   Blood Pressure (Admit) 104/50   Blood Pressure (Exercise) 124/54   Blood Pressure (Exit) 100/58   Heart Rate (Admit) 61 bpm   Heart Rate (Exercise) 83 bpm   Heart Rate (Exit) 69 bpm   Oxygen Saturation (Admit) 97 %   Oxygen Saturation (Exercise) 98 %   Oxygen Saturation (Exit) 97 %   Rating of Perceived Exertion (Exercise) 15   Perceived Dyspnea (Exercise) 1   Duration Progress to 45 minutes of aerobic exercise without signs/symptoms of physical distress   Intensity THRR unchanged     Progression   Progression Continue to progress workloads to maintain intensity without signs/symptoms of physical distress.     Resistance Training   Training Prescription Yes   Weight blue bands   Reps 10-15   Time 10 Minutes     Interval Training   Interval  Training No     NuStep   Level 2   Minutes 34   METs 1.9     Stairs   Minutes 17      History  Smoking Status  . Never Smoker  Smokeless Tobacco  . Never Used    Goals Met:  Exercise tolerated well No report of cardiac concerns or symptoms Strength training completed today  Goals Unmet:  Not Applicable  Comments: Service time is from 10:30a to 12:15p    Dr. Rush Farmer is Medical Director for Pulmonary Rehab at [facility].

## 2017-04-19 NOTE — Progress Notes (Signed)
Called and spoke to pt. Informed him of the results and recs per MR. Pt verbalized understanding and denied any further questions or concerns at this time.  

## 2017-04-20 ENCOUNTER — Encounter (HOSPITAL_COMMUNITY): Payer: Medicare Other

## 2017-04-20 ENCOUNTER — Encounter (HOSPITAL_COMMUNITY)
Admission: RE | Admit: 2017-04-20 | Discharge: 2017-04-20 | Disposition: A | Discharge status: Home / Self Care | Payer: Medicare Other | Source: Ambulatory Visit | Attending: Internal Medicine | Admitting: Internal Medicine

## 2017-04-20 VITALS — Wt 266.3 lb

## 2017-04-20 DIAGNOSIS — J84112 Idiopathic pulmonary fibrosis: Secondary | ICD-10-CM

## 2017-04-20 NOTE — Progress Notes (Signed)
Daily Session Note  Patient Details  Name: Shane [name] MRN: [id number] Date of Birth: October 13, 1941 Referring Provider:     Pulmonary Rehab Walk Test from 03/30/2017 in [place]  Referring Provider  Dr. Chase Caller      Encounter Date: 04/20/2017  Check In:     Session Check In - 04/20/17 1048      Check-In   Location [facility]   Staff Present Su Hilt, MS, ACSM RCEP, Exercise Physiologist;Joan Leonia Reeves, RN, Roque Cash, RN   Supervising physician immediately available to respond to emergencies [facility] immediately available   Physician(s) Dr. Broadus John   Medication changes reported     No   Fall or balance concerns reported    No   Tobacco Cessation No Change   Warm-up and Cool-down Performed as group-led instruction   Resistance Training Performed Yes   VAD Patient? No     Pain Assessment   Currently in Pain? No/denies   Multiple Pain Sites No      Capillary Blood Glucose: No results found for this or any previous visit (from the past 24 hour(s)).      Exercise Prescription Changes - 04/20/17 1200      Response to Exercise   Blood Pressure (Admit) 124/54   Blood Pressure (Exercise) 122/74   Blood Pressure (Exit) 100/46   Heart Rate (Admit) 70 bpm   Heart Rate (Exercise) 65 bpm   Heart Rate (Exit) 61 bpm   Oxygen Saturation (Admit) 97 %   Oxygen Saturation (Exercise) 97 %   Oxygen Saturation (Exit) 98 %   Rating of Perceived Exertion (Exercise) 12   Perceived Dyspnea (Exercise) 1   Duration Progress to 45 minutes of aerobic exercise without signs/symptoms of physical distress   Intensity THRR unchanged     Progression   Progression Continue to progress workloads to maintain intensity without signs/symptoms of physical distress.     Resistance Training   Training Prescription Yes   Weight blue bands   Reps 10-15   Time 10 Minutes     Interval Training   Interval Training No     NuStep    Level 2   Minutes 34   METs 2.1      History  Smoking Status  . Never Smoker  Smokeless Tobacco  . Never Used    Goals Met:  Exercise tolerated well No report of cardiac concerns or symptoms Strength training completed today  Goals Unmet:  Not Applicable  Comments: Service time is from 1030 to 1220    Dr. Rush Farmer is Medical Director for Pulmonary Rehab at [facility].

## 2017-04-25 ENCOUNTER — Encounter (HOSPITAL_COMMUNITY)
Admission: RE | Admit: 2017-04-25 | Discharge: 2017-04-25 | Disposition: A | Discharge status: Home / Self Care | Payer: Medicare Other | Source: Ambulatory Visit | Attending: Internal Medicine | Admitting: Internal Medicine

## 2017-04-25 ENCOUNTER — Encounter (HOSPITAL_COMMUNITY): Payer: Medicare Other

## 2017-04-25 VITALS — Wt 263.2 lb

## 2017-04-25 DIAGNOSIS — J84112 Idiopathic pulmonary fibrosis: Secondary | ICD-10-CM

## 2017-04-25 NOTE — Progress Notes (Signed)
Daily Session Note  Patient Details  Name: Shane [name] MRN: [id number] Date of Birth: [birth year] Referring Provider:     Pulmonary Rehab Walk Test from 03/30/2017 in [place]  Referring Provider  Dr. Chase Caller      Encounter Date: 04/25/2017  Check In:     Session Check In - 04/25/17 1204      Check-In   [facility]      Capillary Blood Glucose: No results found for this or any previous visit (from the past 24 hour(s)).     POCT Glucose - 04/25/17 1210      POCT Blood Glucose   Pre-Exercise 207 mg/dL   Post-Exercise 112 mg/dL         Exercise Prescription Changes - 04/25/17 1200      Response to Exercise   Blood Pressure (Admit) 102/44   Blood Pressure (Exercise) 132/70   Blood Pressure (Exit) 100/46   Heart Rate (Admit) 63 bpm   Heart Rate (Exercise) 81 bpm   Heart Rate (Exit) 70 bpm   Oxygen Saturation (Admit) 98 %   Oxygen Saturation (Exercise) 94 %   Oxygen Saturation (Exit) 97 %   Rating of Perceived Exertion (Exercise) 13   Perceived Dyspnea (Exercise) 1   Duration Progress to 45 minutes of aerobic exercise without signs/symptoms of physical distress   Intensity THRR unchanged     Progression   Progression Continue to progress workloads to maintain intensity without signs/symptoms of physical distress.     Resistance Training   Training Prescription Yes   Weight blue bands   Reps 10-15   Time 10 Minutes     Interval Training   Interval Training No     NuStep   Level 3   Minutes 34   METs 2.1     Stairs   Minutes 17      History  Smoking Status  . Never Smoker  Smokeless Tobacco  . Never Used    Goals Met:  Exercise tolerated well No report of cardiac concerns or symptoms Strength training completed today  Goals Unmet:  Not Applicable  Comments: Service time is from 1030 to 1205    Dr. Rush Farmer is Medical Director for Pulmonary Rehab at [facility].

## 2017-04-27 ENCOUNTER — Encounter (HOSPITAL_COMMUNITY)
Admission: RE | Admit: 2017-04-27 | Discharge: 2017-04-27 | Disposition: A | Discharge status: Home / Self Care | Payer: Medicare Other | Source: Ambulatory Visit | Attending: Internal Medicine | Admitting: Internal Medicine

## 2017-04-27 ENCOUNTER — Encounter (HOSPITAL_COMMUNITY): Payer: Medicare Other

## 2017-04-27 VITALS — Wt 263.4 lb

## 2017-04-27 DIAGNOSIS — J84112 Idiopathic pulmonary fibrosis: Secondary | ICD-10-CM

## 2017-04-27 NOTE — Progress Notes (Signed)
Daily Session Note  Patient Details  Name: Shane [name] MRN: [id number] Date of Birth: [birth year] Referring Provider:     Pulmonary Rehab Walk Test from 03/30/2017 in [place]  Referring Provider  Dr. Chase Caller      Encounter Date: 04/27/2017  Check In:     Session Check In - 04/27/17 1030      Check-In   Location [facility]   Staff Present Rosebud Poles, RN, Luisa Hart, RN, BSN;Ramon Dredge, RN, [facility]   Supervising physician immediately available to respond to emergencies [facility] immediately available   Physician(s) Dr. Karleen Hampshire   Medication changes reported     No   Fall or balance concerns reported    No   Tobacco Cessation No Change   Warm-up and Cool-down Performed as group-led instruction   Resistance Training Performed Yes   VAD Patient? No     Pain Assessment   Currently in Pain? No/denies   Multiple Pain Sites No      Capillary Blood Glucose: No results found for this or any previous visit (from the past 24 hour(s)).      Exercise Prescription Changes - 04/27/17 1200      Response to Exercise   Blood Pressure (Admit) 104/60   Blood Pressure (Exercise) 100/60   Blood Pressure (Exit) 92/54   Heart Rate (Admit) 72 bpm   Heart Rate (Exercise) 82 bpm   Heart Rate (Exit) 69 bpm   Oxygen Saturation (Admit) 98 %   Oxygen Saturation (Exercise) 98 %   Oxygen Saturation (Exit) 98 %   Rating of Perceived Exertion (Exercise) 13   Perceived Dyspnea (Exercise) 1   Duration Progress to 45 minutes of aerobic exercise without signs/symptoms of physical distress   Intensity THRR unchanged     Progression   Progression Continue to progress workloads to maintain intensity without signs/symptoms of physical distress.     Resistance Training   Training Prescription Yes   Weight blue bands   Reps 10-15   Time 10 Minutes     Interval Training   Interval Training No     NuStep   Level 3   Minutes  34   METs 2.5     Stairs   Minutes 17      History  Smoking Status  . Never Smoker  Smokeless Tobacco  . Never Used    Goals Met:  Exercise tolerated well No report of cardiac concerns or symptoms Strength training completed today  Goals Unmet:  Not Applicable  Comments: Service time is from 1030 to 1215   Dr. Rush Farmer is Medical Director for Pulmonary Rehab at [facility].

## 2017-05-02 ENCOUNTER — Encounter (HOSPITAL_COMMUNITY)
Admission: RE | Admit: 2017-05-02 | Discharge: 2017-05-02 | Disposition: A | Discharge status: Home / Self Care | Payer: Medicare Other | Source: Ambulatory Visit | Attending: Internal Medicine | Admitting: Internal Medicine

## 2017-05-02 ENCOUNTER — Encounter (HOSPITAL_COMMUNITY): Payer: Medicare Other

## 2017-05-02 VITALS — Wt 261.7 lb

## 2017-05-02 DIAGNOSIS — J84112 Idiopathic pulmonary fibrosis: Secondary | ICD-10-CM

## 2017-05-02 NOTE — Progress Notes (Signed)
Daily Session Note  Patient Details  Name: Shane [name] MRN: [id number] Date of Birth: 07/02/1941 Referring Provider:     Pulmonary Rehab Walk Test from 03/30/2017 in [place]  Referring Provider  Dr. Chase Caller      Encounter Date: 05/02/2017  Check In:     Session Check In - 05/02/17 1019      Check-In   Location [facility]   Staff Present Su Hilt, MS, ACSM RCEP, Exercise Physiologist;Joan Leonia Reeves, RN, Luisa Hart, RN, BSN   Supervising physician immediately available to respond to emergencies [facility] immediately available   Physician(s) Dr. Karleen Hampshire   Medication changes reported     No   Fall or balance concerns reported    No   Tobacco Cessation No Change   Warm-up and Cool-down Performed as group-led instruction   Resistance Training Performed Yes   VAD Patient? No     Pain Assessment   Currently in Pain? No/denies   Multiple Pain Sites No      Capillary Blood Glucose: No results found for this or any previous visit (from the past 24 hour(s)).      Exercise Prescription Changes - 05/02/17 1200      Response to Exercise   Blood Pressure (Admit) 124/66   Blood Pressure (Exercise) 136/70   Blood Pressure (Exit) 96/54   Heart Rate (Admit) 75 bpm   Heart Rate (Exercise) 89 bpm   Heart Rate (Exit) 76 bpm   Oxygen Saturation (Admit) 97 %   Oxygen Saturation (Exercise) 93 %   Oxygen Saturation (Exit) 97 %   Rating of Perceived Exertion (Exercise) 13   Perceived Dyspnea (Exercise) 1   Duration Progress to 45 minutes of aerobic exercise without signs/symptoms of physical distress   Intensity THRR unchanged     Progression   Progression Continue to progress workloads to maintain intensity without signs/symptoms of physical distress.     Resistance Training   Training Prescription Yes   Weight blue bands   Reps 10-15   Time 10 Minutes     Interval Training   Interval Training No     NuStep   Level 4   Minutes 34   METs 2.9     Stairs   Minutes 17      History  Smoking Status  . Never Smoker  Smokeless Tobacco  . Never Used    Goals Met:  Exercise tolerated well No report of cardiac concerns or symptoms Strength training completed today  Goals Unmet:  Not Applicable  Comments: Service time is from 10:30A to 12:05P    Dr. Rush Farmer is Medical Director for Pulmonary Rehab at [facility].

## 2017-05-04 ENCOUNTER — Ambulatory Visit: Payer: Medicare Other | Admitting: Internal Medicine

## 2017-05-04 ENCOUNTER — Encounter (HOSPITAL_COMMUNITY): Payer: Medicare Other

## 2017-05-04 ENCOUNTER — Encounter (HOSPITAL_COMMUNITY)
Admission: RE | Admit: 2017-05-04 | Discharge: 2017-05-04 | Disposition: A | Discharge status: Home / Self Care | Payer: Medicare Other | Source: Ambulatory Visit | Attending: Internal Medicine | Admitting: Internal Medicine

## 2017-05-04 VITALS — Wt 264.3 lb

## 2017-05-04 DIAGNOSIS — J84112 Idiopathic pulmonary fibrosis: Secondary | ICD-10-CM | POA: Diagnosis not present

## 2017-05-04 NOTE — Progress Notes (Signed)
Daily Session Note  Patient Details  Name: KYVON HU MRN: [id number] Date of Birth: [birth year] Referring Provider:     Pulmonary Rehab Walk Test from 03/30/2017 in [place]  Referring Provider  Dr. Chase Caller      Encounter Date: 05/04/2017  Check In:     Session Check In - 05/04/17 1015      Check-In   Location [facility]   Staff Present Su Hilt, MS, ACSM RCEP, Exercise Physiologist;Portia Rollene Rotunda, RN, BSN   Supervising physician immediately available to respond to emergencies [facility] immediately available   Physician(s) Dr. Broadus John   Medication changes reported     No   Fall or balance concerns reported    No   Tobacco Cessation No Change   Warm-up and Cool-down Performed as group-led instruction   Resistance Training Performed Yes   VAD Patient? No     Pain Assessment   Currently in Pain? No/denies   Multiple Pain Sites No      Capillary Blood Glucose: No results found for this or any previous visit (from the past 24 hour(s)).     POCT Glucose - 05/04/17 1237      POCT Blood Glucose   Pre-Exercise 212 mg/dL   Post-Exercise 151 mg/dL         Exercise Prescription Changes - 05/04/17 1200      Response to Exercise   Blood Pressure (Admit) 110/52   Blood Pressure (Exercise) 124/68   Blood Pressure (Exit) 106/54   Heart Rate (Admit) 78 bpm   Heart Rate (Exercise) 80 bpm   Heart Rate (Exit) 73 bpm   Oxygen Saturation (Admit) 100 %   Oxygen Saturation (Exercise) 93 %   Oxygen Saturation (Exit) 97 %   Rating of Perceived Exertion (Exercise) 13   Perceived Dyspnea (Exercise) 1   Duration Progress to 45 minutes of aerobic exercise without signs/symptoms of physical distress   Intensity THRR unchanged     Progression   Progression Continue to progress workloads to maintain intensity without signs/symptoms of physical distress.     Resistance Training   Training Prescription Yes   Weight  blue bands   Reps 10-15   Time 10 Minutes     Interval Training   Interval Training No     NuStep   Level 4   Minutes 17   METs 2.4     Stairs   Minutes 17      History  Smoking Status  . Never Smoker  Smokeless Tobacco  . Never Used    Goals Met:  Exercise tolerated well No report of cardiac concerns or symptoms Strength training completed today  Goals Unmet:  Not Applicable  Comments: Service time is from 10:30a to 12:20p    Dr. Rush Farmer is Medical Director for Pulmonary Rehab at [facility].

## 2017-05-05 ENCOUNTER — Telehealth: Payer: Self-pay | Admitting: Internal Medicine

## 2017-05-05 ENCOUNTER — Encounter: Payer: Self-pay | Admitting: Internal Medicine

## 2017-05-05 ENCOUNTER — Ambulatory Visit (INDEPENDENT_AMBULATORY_CARE_PROVIDER_SITE_OTHER): Payer: Medicare Other | Admitting: Internal Medicine

## 2017-05-05 ENCOUNTER — Other Ambulatory Visit (INDEPENDENT_AMBULATORY_CARE_PROVIDER_SITE_OTHER): Payer: Medicare Other

## 2017-05-05 DIAGNOSIS — J84112 Idiopathic pulmonary fibrosis: Secondary | ICD-10-CM

## 2017-05-05 DIAGNOSIS — Z8669 Personal history of other diseases of the nervous system and sense organs: Secondary | ICD-10-CM | POA: Diagnosis not present

## 2017-05-05 DIAGNOSIS — G4733 Obstructive sleep apnea (adult) (pediatric): Secondary | ICD-10-CM | POA: Insufficient documentation

## 2017-05-05 DIAGNOSIS — Z9989 Dependence on other enabling machines and devices: Secondary | ICD-10-CM | POA: Insufficient documentation

## 2017-05-05 HISTORY — DX: Dependence on other enabling machines and devices: Z99.89

## 2017-05-05 HISTORY — DX: Obstructive sleep apnea (adult) (pediatric): G47.33

## 2017-05-05 LAB — HEPATIC FUNCTION PANEL
ALT: 175 U/L — ABNORMAL HIGH (ref 0–53)
AST: 74 U/L — ABNORMAL HIGH (ref 0–37)
Albumin: 4 g/dL (ref 3.5–5.2)
Alkaline Phosphatase: 94 U/L (ref 39–117)
Bilirubin, Direct: 0.2 mg/dL (ref 0.0–0.3)
Total Bilirubin: 0.6 mg/dL (ref 0.2–1.2)
Total Protein: 7.8 g/dL (ref 6.0–8.3)

## 2017-05-05 NOTE — Progress Notes (Signed)
Subjective:     Patient ID: Shane [name], male   DOB: 1941/11/12, 76 y.o.   MRN: [id number]  HPI  PCP Shane A, MD  HPI   IOV 10/12/2016  Chief Complaint  Patient presents with  . Pulomary Consult    Pt referred by Dr. Reynaldo [name] for DOE x months. pt states the SOB has worsened in the last couple weeks. Pt c/o dry cough. pt deneis f/c/s.     76 year old male referred for dyspnea. Reports that he is obese on CPAP on RA for years. 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 He has new onset dyspnea and cough. Details in [facility] ILD questionnaire below. CXR recently personally visualized shows ILD changes. Reports that pneumonia and lasix Rx by PCP have not helped. Review of old CT chest 12/05/2008 shows bibasal ILD without honeuycombing on non HRCT chest. Possibly datin back to 2007 CT abd lung cut. Current findings are worse.   Symptos:  Dyspnea is new x 4-5 months gets dyspneic hurrying on level ground or stops at 100 yerds. Cough x 3 weeks with cough at night and is dry and can be episodic with severe attacks.   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 Did get xposed to insecticide, fertiiler, talc pain, cement, tile    OV  11/23/2016  Chief Complaint  Patient presents with  . Follow-up    Pt here after PFT, HRCT, RHC. Pt state his breathing has slightly improved. Pt c/o increase in dry cough. Pt deneis CP/tightness and f/c/s.    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 He and his wife stated that now he is getting progressively more short  of breath. Although he does tell me that when he tries to work on his car and he tries to push himself he does start feeling better but he feels that he is deconditioned but is unable to condition himself more in order to feel less short of breath. Wife though feels that his dyspnea is more progressive.  In terms of disease etiology:   - He had high resolution CT chest 10/08/2016 and the CT findings are not consistent with UIP. And that is chronicity to this disease with presence even 7 years ago. This mild progression only since then. He had autoimmune profile which is essentially negative.  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 He will only walk 2 laps and stopped because of shortness of breath. But his pulse ox did not drop below 95%. Resting pulse ox was 99%. Peak heart rate was 102. In essence that was clinically insignificant desaturation   - Results for Shane [name] (MRN [id number]) as of 11/23/2016 12:02  Ref. Range 10/14/2016 08:19  FVC-Pre Latest Units: L 3.00  FVC-%Pred-Pre Latest Units: % 63   Results for Shane [name] (MRN [id number]) as of 11/23/2016 12:02  Ref. Range 10/14/2016 08:19  TLC Latest Units: L 4.62  TLC % pred Latest Units: % 60   Results for Shane [name] (MRN [id number]) as of 11/23/2016 12:02  Ref. Range 10/14/2016 08:19  DLCO unc Latest Units: ml/min/mmHg 16.82  DLCO unc % pred Latest Units: % 46     In terms of operative risk for surgical lung biopsy   - His right heart cath shows normal mean pulmonary artery pressure 20 mm with slight elevation pulmonary artery systolic pressure to 34 mmHg and [name] normal wedge pressure.    - His Myoview is  reported normal in November 2017    - He does have sleep apnea and uses CPA   - He does have gait instability issues because of his chronic spine disease but this is stable    OV 01/05/2017  Chief Complaint  Patient presents with  . Follow-up    Pt here after biopsy. Pt states his breathing is unchanged since last seen in 11/2016. Pt c/o dry cough. Pt denies CP/tightness and f/c/s.     llung bx discussion   - here wife wife. 12/26/16 - UIP by Shane [name]. No new diagnosis    OV 02/20/2017  Chief Complaint  Patient presents with  . Follow-up    Pt states his breathing is unchanged since last OV. Pt still has not recieved the Esbriet because of financial issues and unable to locate [name] foundation to help. Pt c/o dry cough. Pt denies CP/tightness.     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  He is here to follow-up how he was doing on Pirfenidone (Esbriet). However he had delays getting charity support. Helpful foundation ran out of money versus from his income level too high to provide support. He wants to plan of the Shane [name]. Overall he feels stable in terms of cough and dyspnea. He is interested in pulmonary rehabilitation and he has been in touch with Mr. Shane [name] the chair of the local pulmonary fibrosis foundation support group and plans to attend the meeting March 29. There are no other new issues     OV 05/05/2017  Chief Complaint  Patient presents with  . Follow-up    pt states he is at baseline, some sob with exertion.     FU IPF; diagnosed based on biopsy Jan 2018.   Now on esbriet x 2 months. Good tolerance. Only mild fatigue. No GI side efects. Feels better. Seems to think esbriet improves situation; re-educated it does not.  Not using sun screen ; did not remember despite prior education. Wants referral for sleep doc.   Results for Shane [name] (MRN [id number]) as of 05/05/2017 09:08  Ref. Range 10/14/2016 08:19 04/06/2017 10:13  FVC-Pre  Latest Units: L 3.00 2.94  FVC-%Pred-Pre Latest Units: % 63 62  Results for Shane [name] (MRN [id number]) as of 05/05/2017 09:08  Ref. Range 10/14/2016 08:19 04/06/2017 10:13  DLCO unc Latest Units: ml/min/mmHg 16.82 16.43  DLCO unc % pred Latest Units: % 46 45     has [name] past medical history of Cancer ([place]); Cataract; Chest pain; Chronic kidney disease; Diabetes mellitus; Dyspnea; GERD (gastroesophageal reflux disease); Hyperlipidemia; Hypertension; Neuromuscular disorder ([place]); PONV (postoperative nausea and vomiting); and Sleep apnea.   reports that he has never smoked. He has never used smokeless tobacco.  Past  Surgical History:  Procedure Laterality Date  . ANTERIOR FUSION CERVICAL SPINE  2012  . CARDIAC CATHETERIZATION  2011  . CARDIAC CATHETERIZATION N/[name] 11/09/2016   Procedure: Right Heart Cath;  [name]: Belva Crome, MD;  Location: [facility];  Service: Cardiovascular;  Laterality: N/[name];  . carpel tunnel     left wrist  . CATARACT EXTRACTION W/ INTRAOCULAR LENS  IMPLANT, BILATERAL  2013  . CERVICAL LAMINECTOMY  2012  . COLONOSCOPY N/[name] 01/14/2013   Procedure: COLONOSCOPY;  [name]: Irene Shipper, MD;  Location: [facility];  Service: Endoscopy;  Laterality: N/[name];  . EYE SURGERY    . [place]   left  . LUMBAR LAMINECTOMY  2003  . LUNG BIOPSY Left 12/26/2016   Procedure: LUNG BIOPSY;  [name]: [name], MD;  Location: [facility];  Service: Thoracic;  Laterality: Left;  . POSTERIOR FUSION CERVICAL SPINE  2012  . TRIGGER FINGER RELEASE  2011   4th finger left hand  . VIDEO ASSISTED THORACOSCOPY Left 12/26/2016   Procedure: VIDEO ASSISTED THORACOSCOPY;  [name]: [name], MD;  Location: [facility];  Service: Thoracic;  Laterality: Left;  Marland Kitchen VIDEO BRONCHOSCOPY N/[name] 12/26/2016   Procedure: VIDEO BRONCHOSCOPY;  [name]: [name], MD;  Location: [place];  Service: Thoracic;  Laterality: N/[name];    Allergies  Allergen Reactions  . Codeine Hives and  Itching    Immunization History  Administered Date(s) Administered  . Influenza, High Dose Seasonal PF 08/05/2016  . Pneumococcal-Unspecified 12/05/2014    Family History  Problem Relation Age of Onset  . Diabetes Mellitus II Mother   . Emphysema Father 31  . Colon cancer Neg Hx   . Esophageal cancer Neg Hx   . Rectal cancer Neg Hx   . Stomach cancer Neg Hx      Current Outpatient Prescriptions:  .  acetaminophen (TYLENOL) 500 MG tablet, Take 500 mg by mouth every 6 (six) hours as needed for moderate pain., Disp: , Rfl:  .  Ascorbic Acid (VITAMIN C) 1000 MG tablet, Take 1,000 mg by mouth daily., Disp: , Rfl:  .  aspirin 81 MG tablet, Take 81 mg by mouth daily.  , Disp: , Rfl:  .  clotrimazole-betamethasone (LOTRISONE) cream, APPLY TO AFFECTED AREA TWICE [name] DAY, Disp: , Rfl: 0 .  diazepam (VALIUM) 5 MG tablet, Take 5 mg by mouth at bedtime as needed (for sleep). , Disp: , Rfl:  .  furosemide (LASIX) 40 MG tablet, Take 40 mg by mouth daily. , Disp: , Rfl:  .  insulin detemir (LEVEMIR) 100 UNIT/ML injection, Inject 22 Units into the skin at bedtime., Disp: , Rfl:  .  insulin lispro (HUMALOG KWIKPEN) 100 UNIT/ML injection, Inject 20 Units into the skin 3 (three) times daily before meals. Sliding scale, Disp: , Rfl:  .  Insulin Pump Disposable (V-GO 40) KIT, 40 Units by Does not apply route daily. humalog 100 units dispensed every 24 hours. Dispose v-go bag after 24 hours of use. , Disp: , Rfl:  .  KLOR-CON M20 20 MEQ tablet, Take 20 mEq by mouth daily. , Disp: , Rfl:  .  lisinopril (PRINIVIL,ZESTRIL) 20 MG tablet, Take 20 mg by mouth daily. , Disp: , Rfl:  .  Multiple Vitamin (MULTIVITAMIN) tablet, Take 1 tablet by mouth daily.  , Disp: , Rfl:  .  nebivolol (BYSTOLIC) 5 MG tablet, Take 5 mg by mouth daily., Disp: , Rfl:  .  nitroGLYCERIN (NITROSTAT) 0.4  MG SL tablet, Place 0.4 mg under the tongue every 5 (five) minutes as needed. For chest pain, Disp: , Rfl:  .  pantoprazole (PROTONIX)  40 MG tablet, Take 40 mg by mouth daily.  , Disp: , Rfl:  .  Pirfenidone (ESBRIET) 267 MG CAPS, Take 3 capsules by mouth 3 (three) times daily., Disp: 270 capsule, Rfl: 5 .  PROAIR HFA 108 (90 Base) MCG/ACT inhaler, Inhale 2 puffs into the lungs every 4 (four) hours as needed for wheezing or shortness of breath. , Disp: , Rfl:  .  promethazine-phenylephrine (PROMETHAZINE VC PLAIN) 6.25-5 MG/5ML SYRP, Take 5 mLs by mouth every 4 (four) hours as needed for congestion., Disp: , Rfl:  .  Rosuvastatin Calcium (CRESTOR PO), Take by mouth daily., Disp: , Rfl:    Review of Systems     Objective:   Physical Exam  Constitutional: He is oriented to person, place, and time. He appears well-developed and well-nourished. No distress.  HENT:  Head: Normocephalic and atraumatic.  Right Ear: External ear normal.  Left Ear: External ear normal.  Mouth/Throat: Oropharynx is clear and moist. No oropharyngeal exudate.  Eyes: Conjunctivae and EOM are normal. Pupils are equal, round, and reactive to light. Right eye exhibits no discharge. Left eye exhibits no discharge. No scleral icterus.  Neck: Normal range of motion. Neck supple. No JVD present. No tracheal deviation present. No thyromegaly present.  Cardiovascular: Normal rate, regular rhythm and intact distal pulses.  Exam reveals no gallop and no friction rub.   No murmur heard. Pulmonary/Chest: Effort normal. No respiratory distress. He has no wheezes. He has rales. He exhibits no tenderness.  Abdominal: Soft. Bowel sounds are normal. He exhibits no distension and no mass. There is no tenderness. There is no rebound and no guarding.  Visceral obesity +  Musculoskeletal: Normal range of motion. He exhibits no edema or tenderness.  Uses cane  Lymphadenopathy:    He has no cervical adenopathy.  Neurological: He is alert and oriented to person, place, and time. He has normal reflexes. No cranial nerve deficit. Coordination normal.  Skin: Skin is warm and  dry. No rash noted. He is not diaphoretic. No erythema. No pallor.  Psychiatric: He has [name] normal mood and affect. His behavior is normal. Judgment and thought content normal.  Nursing note and vitals reviewed.  Vitals:   05/05/17 0853  BP: 132/64  Pulse: 64  SpO2: 97%  Weight: 263 lb (119.3 kg)  Height: 6' (1.829 m)     Estimated body mass index is 35.67 kg/m as calculated from the following:   Height as of this encounter: 6' (1.829 m).   Weight as of this encounter: 263 lb (119.3 kg).      Assessment:       ICD-9-CM ICD-10-CM   1. 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HCC) 516.31 J84.112   2. History of obstructive sleep apnea 327.23 Z86.69        Plan:     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HCC) Clinically stable and on PFT Nov 2017 -> March 2018 Glad you are tolerating month 2 of esbriet  Plan Ensure sunscreen  Check LFT 05/05/2017 and again in 1 month at local lab our lab  Followup Return in 2 months; at followup if stable will change to consolidated form of esbriet   History of obstructive sleep apnea Refer sleep doc in our office    Dr. Brand Males, M.D., [facility].C.P Pulmonary and Critical Care Medicine Staff Physician [place]  Pulmonary and Critical Care Pager: [phone], If no answer or between  15:00h - 7:00h: call [phone]  05/05/2017 9:26 AM

## 2017-05-05 NOTE — Telephone Encounter (Signed)
Received a call from pt's wife. Informed her of the results and recs per MR. Order placed for LFT. Pt's wife verbalized understanding and denied any further questions or concerns at this time.

## 2017-05-05 NOTE — Assessment & Plan Note (Signed)
Refer sleep doc in our office

## 2017-05-05 NOTE — Assessment & Plan Note (Signed)
Clinically stable and on PFT Nov 2017 -> March 2018 Glad you are tolerating month 2 of esbriet  Plan Ensure sunscreen  Check LFT 05/05/2017 and again in 1 month at local lab our lab  Followup Return in 2 months; at followup if stable will change to consolidated form of esbriet

## 2017-05-05 NOTE — Patient Instructions (Signed)
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[place]) Clinically stable and on PFT Nov 2017 -> March 2018 Glad you are tolerating month 2 of esbriet  Plan Ensure sunscreen  Check LFT 05/05/2017 and again in 1 month at local lab our lab  Followup Return in 2 months; at followup if stable will change to consolidated form of esbriet   History of obstructive sleep apnea Refer sleep doc in our office

## 2017-05-05 NOTE — Telephone Encounter (Signed)
Urgent messsagte  - elise or triage to handle   ALT on esbriet is now 3.3 X normal - first 2 months of esbriet  Plan -  stop crestor that can also increase LFT (if he wont stop crestor then he has to stop esbriet) - recheck LFT in 5 days from 05/05/2017 - if  LFT still hight will have to stop esbriet   Dr. Brand Males, M.D., [facility].C.P Pulmonary and Critical Care Medicine Staff Physician [place] Pulmonary and Critical Care Pager: [phone], If no answer or between  15:00h - 7:00h: call [phone]  05/05/2017 2:01 PM       Recent Labs Lab 05/05/17 0941  AST 74*  ALT 175*  ALKPHOS 94  BILITOT 0.6  PROT 7.8  ALBUMIN 4.0

## 2017-05-05 NOTE — Telephone Encounter (Signed)
ATC pt on all 3 numbers, no answer and no VM. WCB.

## 2017-05-08 ENCOUNTER — Telehealth (HOSPITAL_COMMUNITY): Payer: Self-pay | Admitting: *Deleted

## 2017-05-09 ENCOUNTER — Ambulatory Visit (INDEPENDENT_AMBULATORY_CARE_PROVIDER_SITE_OTHER): Payer: Medicare Other | Admitting: Internal Medicine

## 2017-05-09 ENCOUNTER — Encounter: Payer: Self-pay | Admitting: Internal Medicine

## 2017-05-09 ENCOUNTER — Encounter (HOSPITAL_COMMUNITY)
Admission: RE | Admit: 2017-05-09 | Discharge: 2017-05-09 | Disposition: A | Discharge status: Home / Self Care | Payer: Medicare Other | Source: Ambulatory Visit | Attending: Internal Medicine | Admitting: Internal Medicine

## 2017-05-09 ENCOUNTER — Encounter (HOSPITAL_COMMUNITY): Payer: Medicare Other

## 2017-05-09 VITALS — BP 122/60 | HR 58 | Resp 18 | Ht 71.0 in | Wt 261.8 lb

## 2017-05-09 DIAGNOSIS — E785 Hyperlipidemia, unspecified: Secondary | ICD-10-CM | POA: Diagnosis not present

## 2017-05-09 DIAGNOSIS — J84112 Idiopathic pulmonary fibrosis: Secondary | ICD-10-CM

## 2017-05-09 DIAGNOSIS — G4733 Obstructive sleep apnea (adult) (pediatric): Secondary | ICD-10-CM | POA: Diagnosis not present

## 2017-05-09 DIAGNOSIS — I129 Hypertensive chronic kidney disease with stage 1 through stage 4 chronic kidney disease, or unspecified chronic kidney disease: Secondary | ICD-10-CM | POA: Insufficient documentation

## 2017-05-09 DIAGNOSIS — Z79899 Other long term (current) drug therapy: Secondary | ICD-10-CM | POA: Insufficient documentation

## 2017-05-09 DIAGNOSIS — N189 Chronic kidney disease, unspecified: Secondary | ICD-10-CM | POA: Diagnosis not present

## 2017-05-09 DIAGNOSIS — E1122 Type 2 diabetes mellitus with diabetic chronic kidney disease: Secondary | ICD-10-CM | POA: Insufficient documentation

## 2017-05-09 DIAGNOSIS — Z7982 Long term (current) use of aspirin: Secondary | ICD-10-CM | POA: Insufficient documentation

## 2017-05-09 DIAGNOSIS — Z794 Long term (current) use of insulin: Secondary | ICD-10-CM | POA: Insufficient documentation

## 2017-05-09 DIAGNOSIS — G4730 Sleep apnea, unspecified: Secondary | ICD-10-CM | POA: Insufficient documentation

## 2017-05-09 DIAGNOSIS — K219 Gastro-esophageal reflux disease without esophagitis: Secondary | ICD-10-CM | POA: Insufficient documentation

## 2017-05-09 NOTE — Assessment & Plan Note (Signed)
He has not yet needed supplemental oxygen. If his interstitial lung disease gets to that point there will be some insurance regulations complicating combined prescription of CPAP and oxygen.

## 2017-05-09 NOTE — Progress Notes (Signed)
Pulmonary Individual Treatment Plan  Patient Details  Name: Shane [name] MRN: [id number] Date of Birth: [birth year] Referring Provider:     Pulmonary Rehab Walk Test from 03/30/2017 in [place]  Referring Provider  Dr. Chase Caller      Initial Encounter Date:    Pulmonary Rehab Walk Test from 03/30/2017 in [place]  Date  03/31/17  Referring Provider  Dr. Chase Caller      Visit Diagnosis: 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[place])  Patient's Home Medications on Admission:   Current Outpatient Prescriptions:  .  acetaminophen (TYLENOL) 500 MG tablet, Take 500 mg by mouth every 6 (six) hours as needed for moderate pain., Disp: , Rfl:  .  Ascorbic Acid (VITAMIN C) 1000 MG tablet, Take 1,000 mg by mouth daily., Disp: , Rfl:  .  aspirin 81 MG tablet, Take 81 mg by mouth daily.  , Disp: , Rfl:  .  clotrimazole-betamethasone (LOTRISONE) cream, APPLY TO AFFECTED AREA TWICE A DAY, Disp: , Rfl: 0 .  diazepam (VALIUM) 5 MG tablet, Take 5 mg by mouth at bedtime as needed (for sleep). , Disp: , Rfl:  .  furosemide (LASIX) 40 MG tablet, Take 40 mg by mouth daily. , Disp: , Rfl:  .  insulin detemir (LEVEMIR) 100 UNIT/ML injection, Inject 22 Units into the skin at bedtime., Disp: , Rfl:  .  insulin lispro (HUMALOG KWIKPEN) 100 UNIT/ML injection, Inject 20 Units into the skin 3 (three) times daily before meals. Sliding scale, Disp: , Rfl:  .  Insulin Pump Disposable (V-GO 40) KIT, 40 Units by Does not apply route daily. humalog 100 units dispensed every 24 hours. Dispose v-go bag after 24 hours of use. , Disp: , Rfl:  .  KLOR-CON M20 20 MEQ tablet, Take 20 mEq by mouth daily. , Disp: , Rfl:  .  lisinopril (PRINIVIL,ZESTRIL) 20 MG tablet, Take 20 mg by mouth daily. , Disp: , Rfl:  .  Multiple Vitamin (MULTIVITAMIN) tablet, Take 1 tablet by mouth daily.  , Disp: , Rfl:  .  nebivolol (BYSTOLIC) 5 MG tablet, Take 5 mg by mouth daily., Disp: ,  Rfl:  .  nitroGLYCERIN (NITROSTAT) 0.4 MG SL tablet, Place 0.4 mg under the tongue every 5 (five) minutes as needed. For chest pain, Disp: , Rfl:  .  pantoprazole (PROTONIX) 40 MG tablet, Take 40 mg by mouth daily.  , Disp: , Rfl:  .  Pirfenidone (ESBRIET) 267 MG CAPS, Take 3 capsules by mouth 3 (three) times daily., Disp: 270 capsule, Rfl: 5 .  PROAIR HFA 108 (90 Base) MCG/ACT inhaler, Inhale 2 puffs into the lungs every 4 (four) hours as needed for wheezing or shortness of breath. , Disp: , Rfl:  .  promethazine-phenylephrine (PROMETHAZINE VC PLAIN) 6.25-5 MG/5ML SYRP, Take 5 mLs by mouth every 4 (four) hours as needed for congestion., Disp: , Rfl:  .  Rosuvastatin Calcium (CRESTOR PO), Take by mouth daily., Disp: , Rfl:   Past Medical History: Past Medical History:  Diagnosis Date  . 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 Cataract   . Chest pain   . Chronic kidney disease    d/t DM  . Diabetes mellitus    Vgo disposal insulin bolus  simular to insulin pump  . Dyspnea   . GERD (gastroesophageal reflux disease)   . Hyperlipidemia   . Hypertension   . Neuromuscular disorder ([place])   . PONV (postoperative nausea and vomiting)   .  Sleep apnea     uses cpap asked to bring mask and tubing    Tobacco Use: History  Smoking Status  . Never Smoker  Smokeless Tobacco  . Never Used    Labs: Recent Review Flowsheet Data    Labs for ITP Cardiac and Pulmonary Rehab Latest Ref Rng & Units 11/09/2016 11/09/2016 12/19/2016 12/26/2016 12/27/2016   Cholestrol 0 - 200 mg/dL - - - - -   LDLCALC 0 - 99 mg/dL - - - - -   HDL >39 mg/dL - - - - -   Trlycerides <150 mg/dL - - - - -   Hemoglobin A1c 4.8 - 5.6 % - - 7.9(H) - -   PHART 7.350 - 7.450 - - TEST WILL BE CREDITED 7.376 7.362   PCO2ART 32.0 - 48.0 mmHg - - TEST WILL BE CREDITED 44.0 45.4   HCO3 20.0 - 28.0 mmol/L 28.0 30.0(H) TEST WILL BE CREDITED 25.2 25.1   TCO2 0 - 100 mmol/L 29 31 - - -   ACIDBASEDEF 0.0 - 2.0 mmol/L - - TEST WILL BE CREDITED - -    O2SAT % 59.0 61.0 TEST WILL BE CREDITED 93.9 95.7      Capillary Blood Glucose: Lab Results  Component Value Date   GLUCAP 191 (H) 12/29/2016   GLUCAP 243 (H) 12/28/2016   GLUCAP 233 (H) 12/28/2016   GLUCAP 238 (H) 12/28/2016   GLUCAP 270 (H) 12/28/2016       POCT Glucose    Row Name 04/11/17 1237 04/25/17 1210 05/02/17 1215 05/04/17 1237       POCT Blood Glucose   Pre-Exercise 163 mg/dL 207 mg/dL 185 mg/dL 212 mg/dL    Post-Exercise 155 mg/dL 112 mg/dL 132 mg/dL 151 mg/dL       ADL UCSD:     Pulmonary Assessment Scores    Row Name 04/12/17 1200         ADL UCSD   ADL Phase Entry     SOB Score total 65       CAT Score   CAT Score 24  Pre        Pulmonary Function Assessment:     Pulmonary Function Assessment - 03/27/17 1108      Breath   Bilateral Breath Sounds Other  fine crackles in lower lung fields bilat   Shortness of Breath Yes;Limiting activity      Exercise Target Goals:    Exercise Program Goal: Individual exercise prescription set with THRR, safety & activity barriers. Participant demonstrates ability to understand and report RPE using BORG scale, to self-measure pulse accurately, and to acknowledge the importance of the exercise prescription.  Exercise Prescription Goal: Starting with aerobic activity 30 plus minutes a day, 3 days per week for initial exercise prescription. Provide home exercise prescription and guidelines that participant acknowledges understanding prior to discharge.  Activity Barriers & Risk Stratification:     Activity Barriers & Cardiac Risk Stratification - 03/27/17 1031      Activity Barriers & Cardiac Risk Stratification   Activity Barriers Shortness of Breath;Neck/Spine Problems;Back Problems;Balance Concerns;History of Falls;Assistive Device;Deconditioning      6 Minute Walk:     6 Minute Walk    Row Name 03/31/17 1104         6 Minute Walk   Phase Initial     Distance 600 feet     Walk Time 6  minutes     # of Rest Breaks 0     MPH 1.13  METS 1.84     RPE 14     Perceived Dyspnea  3     Symptoms Yes (comment)     Comments foot drag, unstable even with assistive device     Resting HR 72 bpm     Resting BP 104/98     Max Ex. HR 85 bpm     Max Ex. BP 134/50       Interval HR   Baseline HR 72     1 Minute HR 84     2 Minute HR 80     3 Minute HR 85     4 Minute HR 95     5 Minute HR 85     6 Minute HR 81     2 Minute Post HR 74     Interval Heart Rate? Yes       Interval Oxygen   Interval Oxygen? Yes     Baseline Oxygen Saturation % 97 %     Baseline Liters of Oxygen 0 L     1 Minute Oxygen Saturation % 94 %     1 Minute Liters of Oxygen 0 L     2 Minute Oxygen Saturation % 91 %     2 Minute Liters of Oxygen 0 L     3 Minute Oxygen Saturation % 93 %     3 Minute Liters of Oxygen 0 L     4 Minute Oxygen Saturation % 93 %     4 Minute Liters of Oxygen 0 L     5 Minute Oxygen Saturation % 93 %     5 Minute Liters of Oxygen 0 L     6 Minute Oxygen Saturation % 95 %     6 Minute Liters of Oxygen 0 L     2 Minute Post Oxygen Saturation % 97 %     2 Minute Post Liters of Oxygen 0 L        Oxygen Initial Assessment:     Oxygen Initial Assessment - 03/31/17 1113      Initial 6 min Walk   Oxygen Used None   Resting Oxygen Saturation  during 6 min walk 91 %     Program Oxygen Prescription   Program Oxygen Prescription None      Oxygen Re-Evaluation:     Oxygen Re-Evaluation    Row Name 04/11/17 1447 05/09/17 0737           Program Oxygen Prescription   Program Oxygen Prescription None None        Home Oxygen   Home Oxygen Device None None         Oxygen Discharge (Final Oxygen Re-Evaluation):     Oxygen Re-Evaluation - 05/09/17 0737      Program Oxygen Prescription   Program Oxygen Prescription None     Home Oxygen   Home Oxygen Device None      Initial Exercise Prescription:     Initial Exercise Prescription - 03/31/17 1100       Date of Initial Exercise RX and Referring Provider   Date 03/31/17   Referring Provider Dr. Chase Caller     NuStep   Level 2   Minutes 34   METs 1.5     Stairs   Sets 5   Minutes 17     Prescription Details   Frequency (times per week) 2   Duration Progress to 45 minutes of aerobic exercise without signs/symptoms of physical distress  Intensity   THRR 40-80% of Max Heartrate 58-116   Ratings of Perceived Exertion 11-13   Perceived Dyspnea 0-4     Progression   Progression Continue progressive overload as per policy without signs/symptoms or physical distress.     Resistance Training   Training Prescription Yes   Weight blue bands   Reps 10-15      Perform Capillary Blood Glucose checks as needed.  Exercise Prescription Changes:     Exercise Prescription Changes    Row Name 04/11/17 1200 04/13/17 1200 04/18/17 1200 04/20/17 1200 04/25/17 1200     Response to Exercise   Blood Pressure (Admit) 116/64 104/56 104/50 124/54 102/44   Blood Pressure (Exercise) 142/52 142/52 124/54 122/74 132/70   Blood Pressure (Exit) 108/64 114/58 100/58 100/46 100/46   Heart Rate (Admit) 63 bpm 56 bpm 61 bpm 70 bpm 63 bpm   Heart Rate (Exercise) 72 bpm 65 bpm 83 bpm 65 bpm 81 bpm   Heart Rate (Exit) 68 bpm 57 bpm 69 bpm 61 bpm 70 bpm   Oxygen Saturation (Admit) 95 % 98 % 97 % 97 % 98 %   Oxygen Saturation (Exercise) 95 % 99 % 98 % 97 % 94 %   Oxygen Saturation (Exit) 97 % 99 % 97 % 98 % 97 %   Rating of Perceived Exertion (Exercise) '15 12 15 12 13   '$ Perceived Dyspnea (Exercise) '2 1 1 1 1   '$ Duration Progress to 45 minutes of aerobic exercise without signs/symptoms of physical distress Progress to 45 minutes of aerobic exercise without signs/symptoms of physical distress Progress to 45 minutes of aerobic exercise without signs/symptoms of physical distress Progress to 45 minutes of aerobic exercise without signs/symptoms of physical distress Progress to 45 minutes of aerobic  exercise without signs/symptoms of physical distress   Intensity Other (comment)  % of HRR- THRR unchanged THRR unchanged THRR unchanged THRR unchanged     Progression   Progression Continue to progress workloads to maintain intensity without signs/symptoms of physical distress. Continue to progress workloads to maintain intensity without signs/symptoms of physical distress. Continue to progress workloads to maintain intensity without signs/symptoms of physical distress. Continue to progress workloads to maintain intensity without signs/symptoms of physical distress. Continue to progress workloads to maintain intensity without signs/symptoms of physical distress.     Resistance Training   Training Prescription Yes Yes Yes Yes Yes   Weight blue bands blue bands blue bands blue bands blue bands   Reps 10-15 10-15 10-15 10-15 10-15   Time 10 Minutes 10 Minutes 10 Minutes 10 Minutes 10 Minutes     Interval Training   Interval Training  - No No No No     NuStep   Level '2 2 2 2 3   '$ Minutes 34 34 34 34 34   METs 1.9 1.8 1.9 2.1 2.1     Stairs   Minutes 17  - 17  - [place] Name 04/27/17 1200 05/02/17 1200 05/04/17 1200         Response to Exercise   Blood Pressure (Admit) 104/60 124/66 110/52     Blood Pressure (Exercise) 100/60 136/70 124/68     Blood Pressure (Exit) 92/54 96/54 106/54     Heart Rate (Admit) 72 bpm 75 bpm 78 bpm     Heart Rate (Exercise) 82 bpm 89 bpm 80 bpm     Heart Rate (Exit) 69 bpm 76 bpm 73 bpm     Oxygen Saturation (Admit)  98 % 97 % 100 %     Oxygen Saturation (Exercise) 98 % 93 % 93 %     Oxygen Saturation (Exit) 98 % 97 % 97 %     Rating of Perceived Exertion (Exercise) '13 13 13     '$ Perceived Dyspnea (Exercise) '1 1 1     '$ Duration Progress to 45 minutes of aerobic exercise without signs/symptoms of physical distress Progress to 45 minutes of aerobic exercise without signs/symptoms of physical distress Progress to 45 minutes of aerobic exercise without  signs/symptoms of physical distress     Intensity THRR unchanged THRR unchanged THRR unchanged       Progression   Progression Continue to progress workloads to maintain intensity without signs/symptoms of physical distress. Continue to progress workloads to maintain intensity without signs/symptoms of physical distress. Continue to progress workloads to maintain intensity without signs/symptoms of physical distress.       Resistance Training   Training Prescription Yes Yes Yes     Weight blue bands blue bands blue bands     Reps 10-15 10-15 10-15     Time 10 Minutes 10 Minutes 10 Minutes       Interval Training   Interval Training No No No       NuStep   Level '3 4 4     '$ Minutes 34 34 17     METs 2.5 2.9 2.4       Stairs   Minutes '17 17 17        '$ Exercise Comments:   Exercise Goals and Review:     Exercise Goals    Row Name 03/27/17 1034             Exercise Goals   Increase Physical Activity Yes       Intervention Provide advice, education, support and counseling about physical activity/exercise needs.;Develop an individualized exercise prescription for aerobic and resistive training based on initial evaluation findings, risk stratification, comorbidities and participant's personal goals.       Expected Outcomes Achievement of increased cardiorespiratory fitness and enhanced flexibility, muscular endurance and strength shown through measurements of functional capacity and personal statement of participant.       Increase Strength and Stamina Yes       Intervention Provide advice, education, support and counseling about physical activity/exercise needs.;Develop an individualized exercise prescription for aerobic and resistive training based on initial evaluation findings, risk stratification, comorbidities and participant's personal goals.       Expected Outcomes Achievement of increased cardiorespiratory fitness and enhanced flexibility, muscular endurance and strength  shown through measurements of functional capacity and personal statement of participant.          Exercise Goals Re-Evaluation :     Exercise Goals Re-Evaluation    Row Name 05/08/17 1121             Exercise Goal Re-Evaluation   Exercise Goals Review Increase Physical Activity;Increase Strenth and Stamina       Comments Patient is progressing well in rehab. He states that he already sees a difference in how he feels at home preforming ADL's. Attendance is consistent. Will cont. to monitor and progress as able.        Expected Outcomes Through exercise at rehab and at home patient will be able to increase physical activity, strength, and stamina.          Discharge Exercise Prescription (Final Exercise Prescription Changes):     Exercise Prescription Changes - 05/04/17 1200  Response to Exercise   Blood Pressure (Admit) 110/52   Blood Pressure (Exercise) 124/68   Blood Pressure (Exit) 106/54   Heart Rate (Admit) 78 bpm   Heart Rate (Exercise) 80 bpm   Heart Rate (Exit) 73 bpm   Oxygen Saturation (Admit) 100 %   Oxygen Saturation (Exercise) 93 %   Oxygen Saturation (Exit) 97 %   Rating of Perceived Exertion (Exercise) 13   Perceived Dyspnea (Exercise) 1   Duration Progress to 45 minutes of aerobic exercise without signs/symptoms of physical distress   Intensity THRR unchanged     Progression   Progression Continue to progress workloads to maintain intensity without signs/symptoms of physical distress.     Resistance Training   Training Prescription Yes   Weight blue bands   Reps 10-15   Time 10 Minutes     Interval Training   Interval Training No     NuStep   Level 4   Minutes 17   METs 2.4     Stairs   Minutes 17      Nutrition:  Target Goals: Understanding of nutrition guidelines, daily intake of sodium '1500mg'$ , cholesterol '200mg'$ , calories 30% from fat and 7% or less from saturated fats, daily to have 5 or more servings of fruits and  vegetables.  Biometrics:     Pre Biometrics - 03/27/17 1034      Pre Biometrics   Grip Strength 30 kg       Nutrition Therapy Plan and Nutrition Goals:   Nutrition Discharge: Rate Your Plate Scores:   Nutrition Goals Re-Evaluation:   Nutrition Goals Discharge (Final Nutrition Goals Re-Evaluation):   Psychosocial: Target Goals: Acknowledge presence or absence of significant depression and/or stress, maximize coping skills, provide positive support system. Participant is able to verbalize types and ability to use techniques and skills needed for reducing stress and depression.  Initial Review & Psychosocial Screening:     Initial Psych Review & Screening - 03/27/17 1056      Initial Review   Current issues with None Identified     Family Dynamics   Good Support System? Yes     Barriers   Psychosocial barriers to participate in program There are no identifiable barriers or psychosocial needs.     Screening Interventions   Interventions Encouraged to exercise      Quality of Life Scores:   PHQ-9: Recent Review Flowsheet Data    Depression screen [facility] 2/9 03/27/2017   Decreased Interest 0   Down, Depressed, Hopeless 0   PHQ - 2 Score 0     Interpretation of Total Score  Total Score Depression Severity:  1-4 = Minimal depression, 5-9 = Mild depression, 10-14 = Moderate depression, 15-19 = Moderately severe depression, 20-27 = Severe depression   Psychosocial Evaluation and Intervention:     Psychosocial Evaluation - 05/09/17 0740      Psychosocial Evaluation & Interventions   Interventions Encouraged to exercise with the program and follow exercise prescription   Comments no psychosocial barriers identified over the past 30 days   Expected Outcomes patient will remain free of psychosocial barriers to participation in pulmonary rehab   Continue Psychosocial Services  No Follow up required      Psychosocial Re-Evaluation:     Psychosocial  Re-Evaluation    [place] Name 05/09/17 0741             Psychosocial Re-Evaluation   Current issues with None Identified       Continue Psychosocial Services  No Follow up required          Psychosocial Discharge (Final Psychosocial Re-Evaluation):     Psychosocial Re-Evaluation - 05/09/17 0741      Psychosocial Re-Evaluation   Current issues with None Identified   Continue Psychosocial Services  No Follow up required      Education: Education Goals: Education classes will be provided on a weekly basis, covering required topics. Participant will state understanding/return demonstration of topics presented.  Learning Barriers/Preferences:     Learning Barriers/Preferences - 03/27/17 1055      Learning Barriers/Preferences   Learning Barriers None   Learning Preferences Computer/Internet;Group Instruction;Verbal Instruction;Written Material;Individual Instruction      Education Topics: Risk Factor Reduction:  -Group instruction that is supported by a PowerPoint presentation. Instructor discusses the definition of a risk factor, different risk factors for pulmonary disease, and how the heart and lungs work together.     Nutrition for Pulmonary Patient:  -Group instruction provided by PowerPoint slides, verbal discussion, and written materials to support subject matter. The instructor gives an explanation and review of healthy diet recommendations, which includes a discussion on weight management, recommendations for fruit and vegetable consumption, as well as protein, fluid, caffeine, fiber, sodium, sugar, and alcohol. Tips for eating when patients are short of breath are discussed.   Pursed Lip Breathing:  -Group instruction that is supported by demonstration and informational handouts. Instructor discusses the benefits of pursed lip and diaphragmatic breathing and detailed demonstration on how to preform both.     Oxygen Safety:  -Group instruction provided by  PowerPoint, verbal discussion, and written material to support subject matter. There is an overview of "What is Oxygen" and "Why do we need it".  Instructor also reviews how to create a safe environment for oxygen use, the importance of using oxygen as prescribed, and the risks of noncompliance. There is a brief discussion on traveling with oxygen and resources the patient may utilize.   Oxygen Equipment:  -Group instruction provided by [facility] Staff utilizing handouts, written materials, and equipment demonstrations.   PULMONARY REHAB OTHER RESPIRATORY from 05/04/2017 in [place]  Date  04/27/17  Educator  [facility]  Instruction Review Code  2- meets goals/outcomes      Signs and Symptoms:  -Group instruction provided by written material and verbal discussion to support subject matter. Warning signs and symptoms of infection, stroke, and heart attack are reviewed and when to call the physician/911 reinforced. Tips for preventing the spread of infection discussed.   PULMONARY REHAB OTHER RESPIRATORY from 05/04/2017 in [place]  Date  04/06/17  Educator  rn  Instruction Review Code  2- meets goals/outcomes      Advanced Directives:  -Group instruction provided by verbal instruction and written material to support subject matter. Instructor reviews Advanced Directive laws and proper instruction for filling out document.   Pulmonary Video:  -Group video education that reviews the importance of medication and oxygen compliance, exercise, good nutrition, pulmonary hygiene, and pursed lip and diaphragmatic breathing for the pulmonary patient.   Exercise for the Pulmonary Patient:  -Group instruction that is supported by a PowerPoint presentation. Instructor discusses benefits of exercise, core components of exercise, frequency, duration, and intensity of an exercise routine, importance of utilizing pulse oximetry  during exercise, safety while exercising, and options of places to exercise outside of rehab.     Pulmonary Medications:  -Verbally interactive group education provided by instructor with focus on  inhaled medications and proper administration.   PULMONARY REHAB OTHER RESPIRATORY from 05/04/2017 in [place]  Date  04/20/17  Educator  Pharm  Instruction Review Code  2- meets goals/outcomes      Anatomy and Physiology of the Respiratory System and Intimacy:  -Group instruction provided by PowerPoint, verbal discussion, and written material to support subject matter. Instructor reviews respiratory cycle and anatomical components of the respiratory system and their functions. Instructor also reviews differences in obstructive and restrictive respiratory diseases with examples of each. Intimacy, Sex, and Sexuality differences are reviewed with a discussion on how relationships can change when diagnosed with pulmonary disease. Common sexual concerns are reviewed.   PULMONARY REHAB OTHER RESPIRATORY from 05/04/2017 in [place]  Date  04/13/17  Educator  RN  Instruction Review Code  2- meets goals/outcomes      MD DAY -A group question and answer session with a medical doctor that allows participants to ask questions that relate to their pulmonary disease state.   OTHER EDUCATION -Group or individual verbal, written, or video instructions that support the educational goals of the pulmonary rehab program.   Knowledge Questionnaire Score:     Knowledge Questionnaire Score - 04/12/17 1200      Knowledge Questionnaire Score   Pre Score 9/13      Core Components/Risk Factors/Patient Goals at Admission:     Personal Goals and Risk Factors at Admission - 03/27/17 1057      Core Components/Risk Factors/Patient Goals on Admission    Weight Management Obesity;Yes   Intervention Weight Management: Develop a combined nutrition  and exercise program designed to reach desired caloric intake, while maintaining appropriate intake of nutrient and fiber, sodium and fats, and appropriate energy expenditure required for the weight goal.;Obesity: Provide education and appropriate resources to help participant work on and attain dietary goals.;Weight Management/Obesity: Establish reasonable short term and long term weight goals.;Weight Management: Provide education and appropriate resources to help participant work on and attain dietary goals.   Expected Outcomes Short Term: Continue to assess and modify interventions until short term weight is achieved;Understanding of distribution of calorie intake throughout the day with the consumption of 4-5 meals/snacks;Understanding recommendations for meals to include 15-35% energy as protein, 25-35% energy from fat, 35-60% energy from carbohydrates, less than '200mg'$  of dietary cholesterol, 20-35 gm of total fiber daily;Weight Loss: Understanding of general recommendations for a balanced deficit meal plan, which promotes 1-2 lb weight loss per week and includes a negative energy balance of [phone] kcal/d   Improve shortness of breath with ADL's Yes   Intervention Provide education, individualized exercise plan and daily activity instruction to help decrease symptoms of SOB with activities of daily living.   Expected Outcomes Short Term: Achieves a reduction of symptoms when performing activities of daily living.   Develop more efficient breathing techniques such as purse lipped breathing and diaphragmatic breathing; and practicing self-pacing with activity Yes   Intervention Provide education, demonstration and support about specific breathing techniuqes utilized for more efficient breathing. Include techniques such as pursed lipped breathing, diaphragmatic breathing and self-pacing activity.   Expected Outcomes Short Term: Participant will be able to demonstrate and use breathing techniques as needed  throughout daily activities.   Diabetes Yes   Intervention Provide education about signs/symptoms and action to take for hypo/hyperglycemia.;Provide education about proper nutrition, including hydration, and aerobic/resistive exercise prescription along with prescribed medications to achieve blood glucose in normal ranges: Fasting glucose 65-99  mg/dL   Expected Outcomes Short Term: Participant verbalizes understanding of the signs/symptoms and immediate care of hyper/hypoglycemia, proper foot care and importance of medication, aerobic/resistive exercise and nutrition plan for blood glucose control.;Long Term: Attainment of HbA1C < 7%.      Core Components/Risk Factors/Patient Goals Review:      Goals and Risk Factor Review    Row Name 05/09/17 0739             Core Components/Risk Factors/Patient Goals Review   Personal Goals Review Weight Management/Obesity;Diabetes;Develop more efficient breathing techniques such as purse lipped breathing and diaphragmatic breathing and practicing self-pacing with activity.;Improve shortness of breath with ADL's       Review see comment section on ITP       Expected Outcomes see Admission expected outcomes          Core Components/Risk Factors/Patient Goals at Discharge (Final Review):      Goals and Risk Factor Review - 05/09/17 0739      Core Components/Risk Factors/Patient Goals Review   Personal Goals Review Weight Management/Obesity;Diabetes;Develop more efficient breathing techniques such as purse lipped breathing and diaphragmatic breathing and practicing self-pacing with activity.;Improve shortness of breath with ADL's   Review see comment section on ITP   Expected Outcomes see Admission expected outcomes      ITP Comments:   Comments: ITP REVIEW Pt is making expected progress toward pulmonary rehab goals after completing 8 sessions. He has lost 1 kg since admission and has resolved to eating healthier. He has also committed to  managing his diabetes more appropriately by limiting his carbs. He admits not monitoring his carbs until beginning rehab. He states he is beginning to see improvement in his stamina and strength and a decrease in his shortness of breath with exertion. Recommend continued exercise, life style modification, education, and utilization of breathing techniques to increase stamina and strength and decrease shortness of breath with exertion.

## 2017-05-09 NOTE — Progress Notes (Signed)
Daily Session Note  Patient Details  Name: Shane [name] MRN: [id number] Date of Birth: [birth year] Referring Provider:     Pulmonary Rehab Walk Test from 03/30/2017 in [place]  Referring Provider  Dr. Chase Caller      Encounter Date: 05/09/2017  Check In:     Session Check In - 05/09/17 1547      Check-In   Location [facility]   Staff Present Su Hilt, MS, ACSM RCEP, Exercise Physiologist;Lisa Ysidro Evert, RN;Portia Rollene Rotunda, RN, BSN   Supervising physician immediately available to respond to emergencies [facility] immediately available   Physician(s) Dr. Broadus John   Medication changes reported     No   Fall or balance concerns reported    No   Tobacco Cessation No Change   Warm-up and Cool-down Performed as group-led instruction   Resistance Training Performed Yes   VAD Patient? No     Pain Assessment   Currently in Pain? No/denies   Multiple Pain Sites No      Capillary Blood Glucose: No results found for this or any previous visit (from the past 24 hour(s)).      Exercise Prescription Changes - 05/09/17 1500      Response to Exercise   Blood Pressure (Admit) 96/42   Blood Pressure (Exercise) 127/57   Blood Pressure (Exit) 94/46   Heart Rate (Admit) 74 bpm   Heart Rate (Exercise) 95 bpm   Heart Rate (Exit) 80 bpm   Oxygen Saturation (Admit) 97 %   Oxygen Saturation (Exercise) 90 %   Oxygen Saturation (Exit) 96 %   Rating of Perceived Exertion (Exercise) 13   Perceived Dyspnea (Exercise) 3   Duration Progress to 45 minutes of aerobic exercise without signs/symptoms of physical distress   Intensity THRR unchanged     Progression   Progression Continue to progress workloads to maintain intensity without signs/symptoms of physical distress.     Resistance Training   Training Prescription Yes   Weight blue bands   Reps 10-15   Time 10 Minutes     Interval Training   Interval Training No     NuStep   Level 3   Minutes 34   METs 2     Stairs   Minutes 17      History  Smoking Status  . Never Smoker  Smokeless Tobacco  . Never Used    Goals Met:  Exercise tolerated well No report of cardiac concerns or symptoms Strength training completed today  Goals Unmet:  Not Applicable  Comments: Service time is from 1:30p to 3:00p    Dr. Rush Farmer is Medical Director for Pulmonary Rehab at [facility]

## 2017-05-09 NOTE — Progress Notes (Signed)
05/09/17-76 year old male never smoker referred by Dr Chase Caller  for sleep medicine evaluation with history of OSA/CPAP. He is followed here by Dr. Chase Caller for ILD/Esbriet/pulmonary rehabilitation SLEEP CONSULT patient is here for a sleep consult, patient has a CPAP but it is about 76 years old and does work very well patient sleeps about 8 hours patients wife states that the machine in turning on and off at night and the mask is slipping on and off  Medical problem list includes DM2, CKD, GERD, HBP It sounds as if original diagnostic study was by neurology with no follow-up. He had been getting supplies on line. Pressure 15 with nasal mask. He says he has slept very well with CPAP and wife confirms he has used all night every night machine is now old and he needs to establish with a DME company for replacement. ENT surgery-none [place] with CPAP 4/24  Prior to Admission medications   Medication Sig Start Date End Date Taking? Authorizing Provider  acetaminophen (TYLENOL) 500 MG tablet Take 500 mg by mouth every 6 (six) hours as needed for moderate pain.   Yes [provider]  Ascorbic Acid (VITAMIN C) 1000 MG tablet Take 1,000 mg by mouth daily.   Yes [provider]  aspirin 81 MG tablet Take 81 mg by mouth daily.     Yes [provider]  clotrimazole-betamethasone (LOTRISONE) cream APPLY TO AFFECTED AREA TWICE A DAY 01/16/17  Yes [provider]  diazepam (VALIUM) 5 MG tablet Take 5 mg by mouth at bedtime as needed (for sleep).    Yes [provider]  furosemide (LASIX) 40 MG tablet Take 40 mg by mouth daily.  09/26/16  Yes [provider]  insulin detemir (LEVEMIR) 100 UNIT/ML injection Inject 22 Units into the skin at bedtime.   Yes [provider]  insulin lispro (HUMALOG KWIKPEN) 100 UNIT/ML injection Inject 20 Units into the skin 3 (three) times daily before meals. Sliding scale   Yes [provider]  Insulin Pump  Disposable (V-GO 40) KIT 40 Units by Does not apply route daily. humalog 100 units dispensed every 24 hours. Dispose v-go bag after 24 hours of use.    Yes [provider]  KLOR-CON M20 20 MEQ tablet Take 20 mEq by mouth daily.  09/26/16  Yes [provider]  lisinopril (PRINIVIL,ZESTRIL) 20 MG tablet Take 20 mg by mouth daily.  09/08/16  Yes [provider]  Multiple Vitamin (MULTIVITAMIN) tablet Take 1 tablet by mouth daily.     Yes [provider]  nebivolol (BYSTOLIC) 5 MG tablet Take 5 mg by mouth daily.   Yes [provider]  nitroGLYCERIN (NITROSTAT) 0.4 MG SL tablet Place 0.4 mg under the tongue every 5 (five) minutes as needed. For chest pain   Yes [provider]  pantoprazole (PROTONIX) 40 MG tablet Take 40 mg by mouth daily.     Yes [provider]  Pirfenidone (ESBRIET) 267 MG CAPS Take 3 capsules by mouth 3 (three) times daily. 02/03/17  Yes Brand Males, MD  PROAIR HFA 108 [phone] Base) MCG/ACT inhaler Inhale 2 puffs into the lungs every 4 (four) hours as needed for wheezing or shortness of breath.  10/03/16  Yes [provider]  promethazine-phenylephrine (PROMETHAZINE VC PLAIN) 6.25-5 MG/5ML SYRP Take 5 mLs by mouth every 4 (four) hours as needed for congestion.   Yes [provider]  Rosuvastatin Calcium (CRESTOR PO) Take by mouth daily.   Yes [provider]  Past Medical History:  Diagnosis Date  . Cancer ([place])    ling  . Cataract   . Chest pain   . Chronic kidney disease    d/t DM  . Diabetes mellitus    Vgo disposal insulin bolus  simular to insulin pump  . Dyspnea   . GERD (gastroesophageal reflux disease)   . Hyperlipidemia   . Hypertension   . Neuromuscular disorder ([place])   . PONV (postoperative nausea and vomiting)   . Sleep apnea     uses cpap asked to bring mask and tubing   Past Surgical History:  Procedure Laterality Date  . ANTERIOR FUSION CERVICAL SPINE  2012  .  CARDIAC CATHETERIZATION  2011  . CARDIAC CATHETERIZATION N/A 11/09/2016   Procedure: Right Heart Cath;  Surgeon: Belva Crome, MD;  Location: [place] [facility];  Service: Cardiovascular;  Laterality: N/A;  . carpel tunnel     left wrist  . CATARACT EXTRACTION W/ INTRAOCULAR LENS  IMPLANT, BILATERAL  2013  . CERVICAL LAMINECTOMY  2012  . COLONOSCOPY N/A 01/14/2013   Procedure: COLONOSCOPY;  Surgeon: Irene Shipper, MD;  Location: [facility];  Service: Endoscopy;  Laterality: N/A;  . EYE SURGERY    . [place]   left  . LUMBAR LAMINECTOMY  2003  . LUNG BIOPSY Left 12/26/2016   Procedure: LUNG BIOPSY;  Surgeon: [name], MD;  Location: [place];  Service: Thoracic;  Laterality: Left;  . POSTERIOR FUSION CERVICAL SPINE  2012  . TRIGGER FINGER RELEASE  2011   4th finger left hand  . VIDEO ASSISTED THORACOSCOPY Left 12/26/2016   Procedure: VIDEO ASSISTED THORACOSCOPY;  Surgeon: [name], MD;  Location: [place];  Service: Thoracic;  Laterality: Left;  Marland Kitchen VIDEO BRONCHOSCOPY N/A 12/26/2016   Procedure: VIDEO BRONCHOSCOPY;  Surgeon: [name], MD;  Location: [facility];  Service: Thoracic;  Laterality: N/A;   Family History  Problem Relation Age of Onset  . Diabetes Mellitus II Mother   . Emphysema Father 23  . Colon cancer Neg Hx   . Esophageal cancer Neg Hx   . Rectal cancer Neg Hx   . Stomach cancer Neg Hx    Social History   Social History  . Marital status: Married    Spouse name: N/A  . Number of children: N/A  . Years of education: N/A   Occupational History  . Not on file.   Social History Main Topics  . Smoking status: Never Smoker  . Smokeless tobacco: Never Used  . Alcohol use No  . Drug use: No  . Sexual activity: Not on file   Other Topics Concern  . 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    ROS-see HPI   Negative unless "+" Constitutional:    weight loss, night sweats, fevers, chills, fatigue,  lassitude. HEENT:    headaches, difficulty swallowing, tooth/dental problems, sore throat,       sneezing, itching, ear ache, nasal congestion, post nasal drip, snoring CV:    chest pain, orthopnea, PND, swelling in lower extremities, anasarca,                                                  dizziness, palpitations Resp:   shortness of breath with exertion or  at rest.                productive cough,   non-productive cough, coughing up of blood.              change in color of mucus.  wheezing.   Skin:    rash or lesions. GI:  No-   heartburn, indigestion, abdominal pain, nausea, vomiting, diarrhea,                 change in bowel habits, loss of appetite GU: dysuria, change in color of urine, no urgency or frequency.   flank pain. MS:   joint pain, stiffness, decreased range of motion, back pain. Neuro-     nothing unusual Psych:  change in mood or affect.  depression or anxiety.   memory loss.    OBJ- Physical Exam General- Alert, Oriented, Affect-appropriate, Distress- none acute, Muscular Skin- rash-none, lesions- none, excoriation- none Lymphadenopathy- none Head- atraumatic            Eyes- Gross vision intact, PERRLA, conjunctivae and secretions clear            Ears- Hearing, canals-normal            Nose- Clear, no-Septal dev, mucus, polyps, erosion, perforation             Throat- Mallampati IV , mucosa clear , drainage- none, tonsils- atrophic Neck- flexible , trachea midline, no stridor , thyroid nl, carotid no bruit Chest - symmetrical excursion , unlabored           Heart/CV- RRR , no murmur , no gallop  , no rub, nl s1 s2                           - JVD- none , edema- none, stasis changes- none, varices- none           Lung- + scattered crackles, wheeze- none, cough- none , dullness-none, rub- none           Chest wall-  Abd-  Br/ Gen/ Rectal- Not done, not indicated Extrem- cyanosis- none, clubbing, none, atrophy- none, strength- nl Neuro- grossly intact to  observation

## 2017-05-09 NOTE — Assessment & Plan Note (Signed)
He has been a successful compliant long time CPAP user. Machine is now over 76 years old and worn out. He needs to reestablish. Plan-new sleep study to be followed by order for replacement of old CPAP machine, establishing him with a DME company

## 2017-05-09 NOTE — Patient Instructions (Signed)
Order- schedule unattended home sleep test    Dx OSA   Please let me know when you have completed the sleep test so we can put in the orders to get your new DME and CPAP machine

## 2017-05-09 NOTE — Progress Notes (Addendum)
Shane [name] 76 y.o. male Nutrition Note Spoke with pt. Pt is obese and wants to lose wt. Pt states he has lost 12 lb over the past 2 months due to dietary changes and exercising. According to EMR, pt wt is down 16 lb over the past 3+ months. There are some ways the pt can make his eating habits healthier. Pt has recently stopped eating fried food, fast food, and decreased red meat consumption. Pt's Rate Your Plate results reviewed with pt. Pt is diabetic. Last A1c indicates blood glucose not optimally controlled. Pt reports his last A1c was "7.2 or 7.3, which is the best it's ever been." Pt checks CBG's 4 times a day. Pt reports his fasting CBG's have gone from 170-180's to 120's-130's since starting rehab. Pt reports he is now on the V-Go insulin patch. Pt expressed understanding of the information reviewed via feedback method.    Lab Results  Component Value Date   HGBA1C 7.9 (H) 12/19/2016   Weight /BMI 05/09/2017 05/05/2017 05/04/2017 05/02/2017  WEIGHT 261 lb 12.8 oz 263 lb 264 lb 5.3 oz 261 lb 11 oz   Weight /BMI 04/27/2017 04/25/2017 04/20/2017 04/18/2017  WEIGHT 263 lb 7.2 oz 263 lb 3.7 oz 266 lb 5.1 oz 266 lb 1.5 oz   Weight /BMI 04/13/2017 04/11/2017 03/27/2017 02/20/2017  WEIGHT 267 lb 10.2 oz 266 lb 8.6 oz 270 lb 8.1 oz 273 lb   Weight /BMI 01/27/2017  WEIGHT 277 lb    Nutrition Diagnosis ? Food-and nutrition-related knowledge deficit related to lack of exposure to information as related to diagnosis of pulmonary disease ? Obesity related to excessive energy intake as evidenced by a BMI of 36.6  Nutrition Intervention ? Pt's individual nutrition plan and goals reviewed with pt. ? Benefits of adopting healthy eating habits discussed when pt's Rate Your Plate reviewed. ? Pt to attend the Nutrition and Lung Disease class ? Continual client-centered nutrition education by RD, as part of interdisciplinary care. Goal(s) 1. Identify food quantities necessary to achieve wt loss of 1 -2# per week  to a goal wt loss of 2.7-10.9 kg (6-24 lb) at graduation from pulmonary rehab. 2. CBG's in the normal range or as close to normal as is safely possible. Monitor and Evaluate progress toward nutrition goal with team.   Derek Mound, M.Ed, RD, LDN, CDE 05/09/2017 2:28 PM

## 2017-05-11 ENCOUNTER — Encounter (HOSPITAL_COMMUNITY): Payer: Medicare Other

## 2017-05-11 ENCOUNTER — Encounter (HOSPITAL_COMMUNITY)
Admission: RE | Admit: 2017-05-11 | Discharge: 2017-05-11 | Disposition: A | Discharge status: Home / Self Care | Payer: Medicare Other | Source: Ambulatory Visit | Attending: Internal Medicine | Admitting: Internal Medicine

## 2017-05-11 VITALS — Wt 264.3 lb

## 2017-05-11 DIAGNOSIS — J84112 Idiopathic pulmonary fibrosis: Secondary | ICD-10-CM

## 2017-05-11 NOTE — Progress Notes (Signed)
Daily Session Note  Patient Details  Name: Shane [name] MRN: [id number] Date of Birth: 11-26-1941 Referring Provider:     Pulmonary Rehab Walk Test from 03/30/2017 in [place]  Referring Provider  Dr. Chase Caller      Encounter Date: 05/11/2017  Check In:     Session Check In - 05/11/17 1021      Check-In   Location [facility]   Staff Present Trish Fountain, RN, Maxcine Ham, RN, BSN;Molly diVincenzo, MS, ACSM RCEP, Exercise Physiologist;Lisa Ysidro Evert, RN   Supervising physician immediately available to respond to emergencies [facility] immediately available   Physician(s) Dr. Posey Pronto   Medication changes reported     No   Fall or balance concerns reported    No   Tobacco Cessation No Change   Warm-up and Cool-down Performed as group-led instruction   Resistance Training Performed Yes   VAD Patient? No     Pain Assessment   Currently in Pain? No/denies   Multiple Pain Sites No      Capillary Blood Glucose: No results found for this or any previous visit (from the past 24 hour(s)).     POCT Glucose - 05/11/17 1222      POCT Blood Glucose   Pre-Exercise 130 mg/dL   Post-Exercise 107 mg/dL         Exercise Prescription Changes - 05/11/17 1218      Response to Exercise   Blood Pressure (Admit) 102/50   Blood Pressure (Exercise) 120/50   Blood Pressure (Exit) 100/56   Heart Rate (Admit) 62 bpm   Heart Rate (Exercise) 88 bpm   Heart Rate (Exit) 77 bpm   Oxygen Saturation (Admit) 99 %   Oxygen Saturation (Exercise) 92 %   Oxygen Saturation (Exit) 97 %   Rating of Perceived Exertion (Exercise) 13   Perceived Dyspnea (Exercise) 2   Duration Progress to 45 minutes of aerobic exercise without signs/symptoms of physical distress   Intensity THRR unchanged     Progression   Progression Continue to progress workloads to maintain intensity without signs/symptoms of physical distress.     Resistance Training   Training Prescription Yes   Weight blue bands   Reps 10-15   Time 10 Minutes     Interval Training   Interval Training No     NuStep   Level 4   Minutes 34   METs 2.2     Stairs   Minutes 17      History  Smoking Status  . Never Smoker  Smokeless Tobacco  . Never Used    Goals Met:  Independence with exercise equipment Improved SOB with ADL's Exercise tolerated well No report of cardiac concerns or symptoms Strength training completed today  Goals Unmet:  Not Applicable  Comments: Service time is from 1030 to 1200   Dr. Rush Farmer is Medical Director for Pulmonary Rehab at [facility].

## 2017-05-16 ENCOUNTER — Encounter (HOSPITAL_COMMUNITY): Payer: Medicare Other

## 2017-05-16 ENCOUNTER — Encounter (HOSPITAL_COMMUNITY)
Admission: RE | Admit: 2017-05-16 | Discharge: 2017-05-16 | Disposition: A | Discharge status: Home / Self Care | Payer: Medicare Other | Source: Ambulatory Visit | Attending: Internal Medicine | Admitting: Internal Medicine

## 2017-05-16 VITALS — Wt 262.1 lb

## 2017-05-16 DIAGNOSIS — J84112 Idiopathic pulmonary fibrosis: Secondary | ICD-10-CM | POA: Diagnosis not present

## 2017-05-16 NOTE — Progress Notes (Signed)
Daily Session Note  Patient Details  Name: Shane [name] MRN: [id number] Date of Birth: 1941/02/12 Referring Provider:     Pulmonary Rehab Walk Test from 03/30/2017 in [place]  Referring Provider  Dr. Chase Caller      Encounter Date: 05/16/2017  Check In:     Session Check In - 05/16/17 1013      Check-In   Location [facility]   Staff Present Trish Fountain, RN, Maxcine Ham, RN, BSN;Molly diVincenzo, MS, ACSM RCEP, Exercise Physiologist   Supervising physician immediately available to respond to emergencies [facility] immediately available   Physician(s) Dr. Posey Pronto   Medication changes reported     No   Fall or balance concerns reported    No   Tobacco Cessation No Change   Warm-up and Cool-down Performed as group-led instruction   Resistance Training Performed Yes   VAD Patient? No     Pain Assessment   Currently in Pain? No/denies   Multiple Pain Sites No      Capillary Blood Glucose: No results found for this or any previous visit (from the past 24 hour(s)).      Exercise Prescription Changes - 05/16/17 1200      Response to Exercise   Blood Pressure (Admit) 90/58   Blood Pressure (Exercise) 108/64   Blood Pressure (Exit) 104/64   Heart Rate (Admit) 71 bpm   Heart Rate (Exercise) 78 bpm   Heart Rate (Exit) 72 bpm   Oxygen Saturation (Admit) 98 %   Oxygen Saturation (Exercise) 94 %   Oxygen Saturation (Exit) 97 %   Rating of Perceived Exertion (Exercise) 13   Perceived Dyspnea (Exercise) 1   Duration Progress to 45 minutes of aerobic exercise without signs/symptoms of physical distress   Intensity THRR unchanged     Progression   Progression Continue to progress workloads to maintain intensity without signs/symptoms of physical distress.     Resistance Training   Training Prescription Yes   Weight blue bands   Reps 10-15   Time 10 Minutes     Interval Training   Interval Training No     NuStep   Level 4   Minutes 34   METs 2.3     Stairs   Minutes 17      History  Smoking Status  . Never Smoker  Smokeless Tobacco  . Never Used    Goals Met:  Exercise tolerated well No report of cardiac concerns or symptoms Strength training completed today  Goals Unmet:  Not Applicable  Comments: Service time is from 10:30a to 12:00p    Dr. Rush Farmer is Medical Director for Pulmonary Rehab at [facility].

## 2017-05-18 ENCOUNTER — Encounter (HOSPITAL_COMMUNITY)
Admission: RE | Admit: 2017-05-18 | Discharge: 2017-05-18 | Disposition: A | Discharge status: Home / Self Care | Payer: Medicare Other | Source: Ambulatory Visit | Attending: Internal Medicine | Admitting: Internal Medicine

## 2017-05-18 ENCOUNTER — Encounter (HOSPITAL_COMMUNITY): Payer: Medicare Other

## 2017-05-18 VITALS — Wt 260.4 lb

## 2017-05-18 DIAGNOSIS — J84112 Idiopathic pulmonary fibrosis: Secondary | ICD-10-CM

## 2017-05-18 NOTE — Progress Notes (Signed)
Daily Session Note  Patient Details  Name: [name] MRN: [id number] Date of Birth: [birth year] Referring Provider:     Pulmonary Rehab Walk Test from 03/30/2017 in [place]  Referring Provider  Dr. Chase Caller      Encounter Date: 05/18/2017  Check In:     Session Check In - 05/18/17 1009      Check-In   Location [facility]   Staff Present Su Hilt, MS, ACSM RCEP, Exercise Physiologist;Portia Rollene Rotunda, RN, BSN   Supervising physician immediately available to respond to emergencies [facility] immediately available   Physician(s) Dr. Sloan Leiter   Medication changes reported     No   Fall or balance concerns reported    No   Tobacco Cessation No Change   Warm-up and Cool-down Performed as group-led instruction   Resistance Training Performed Yes   VAD Patient? No     Pain Assessment   Currently in Pain? No/denies   Multiple Pain Sites No      Capillary Blood Glucose: No results found for this or any previous visit (from the past 24 hour(s)).      Exercise Prescription Changes - 05/18/17 1200      Response to Exercise   Blood Pressure (Admit) 102/52   Blood Pressure (Exercise) 112/52   Blood Pressure (Exit) 92/60   Heart Rate (Admit) 70 bpm   Heart Rate (Exercise) 79 bpm   Heart Rate (Exit) 59 bpm   Oxygen Saturation (Admit) 98 %   Oxygen Saturation (Exercise) 94 %   Oxygen Saturation (Exit) 97 %   Rating of Perceived Exertion (Exercise) 13   Perceived Dyspnea (Exercise) 1   Duration Progress to 45 minutes of aerobic exercise without signs/symptoms of physical distress   Intensity THRR unchanged     Progression   Progression Continue to progress workloads to maintain intensity without signs/symptoms of physical distress.     Resistance Training   Training Prescription Yes   Weight blue bands   Reps 10-15   Time 10 Minutes     Interval Training   Interval Training No     NuStep   Level 5   Minutes 17   METs 2.2     Stairs   Minutes 17      History  Smoking Status  . Never Smoker  Smokeless Tobacco  . Never Used    Goals Met:  Exercise tolerated well No report of cardiac concerns or symptoms Strength training completed today  Goals Unmet:  Not Applicable  Comments: Service time is from 10:30a to 12:28p    Dr. Rush Farmer is Medical Director for Pulmonary Rehab at [facility].

## 2017-05-23 ENCOUNTER — Encounter (HOSPITAL_COMMUNITY): Payer: Medicare Other

## 2017-05-23 ENCOUNTER — Encounter (HOSPITAL_COMMUNITY)
Admission: RE | Admit: 2017-05-23 | Discharge: 2017-05-23 | Disposition: A | Discharge status: Home / Self Care | Payer: Medicare Other | Source: Ambulatory Visit | Attending: Internal Medicine | Admitting: Internal Medicine

## 2017-05-23 VITALS — Wt 261.0 lb

## 2017-05-23 DIAGNOSIS — J84112 Idiopathic pulmonary fibrosis: Secondary | ICD-10-CM

## 2017-05-23 NOTE — Progress Notes (Signed)
Daily Session Note  Patient Details  Name: Shane [name] MRN: [id number] Date of Birth: Feb 25, 1941 Referring Provider:     Pulmonary Rehab Walk Test from 03/30/2017 in [place]  Referring Provider  Dr. Chase Caller      Encounter Date: 05/23/2017  Check In:     Session Check In - 05/23/17 1014      Check-In   Location [facility]   Staff Present Trish Fountain, RN, Maxcine Ham, RN, BSN;Molly diVincenzo, MS, ACSM RCEP, Exercise Physiologist   Supervising physician immediately available to respond to emergencies [facility] immediately available   Physician(s) Dr. Allyson Sabal   Medication changes reported     No   Fall or balance concerns reported    No   Tobacco Cessation No Change   Warm-up and Cool-down Performed as group-led instruction   Resistance Training Performed Yes   VAD Patient? No     Pain Assessment   Currently in Pain? No/denies   Multiple Pain Sites No      Capillary Blood Glucose: No results found for this or any previous visit (from the past 24 hour(s)).     POCT Glucose - 05/23/17 1214      POCT Blood Glucose   Pre-Exercise 166 mg/dL   Post-Exercise 102 mg/dL         Exercise Prescription Changes - 05/23/17 1212      Response to Exercise   Blood Pressure (Admit) 98/50   Blood Pressure (Exercise) 134/60   Blood Pressure (Exit) 102/68   Heart Rate (Admit) 72 bpm   Heart Rate (Exercise) 79 bpm   Heart Rate (Exit) 71 bpm   Oxygen Saturation (Admit) 98 %   Oxygen Saturation (Exercise) 93 %   Oxygen Saturation (Exit) 97 %   Rating of Perceived Exertion (Exercise) 12   Perceived Dyspnea (Exercise) 1   Duration Progress to 45 minutes of aerobic exercise without signs/symptoms of physical distress   Intensity THRR unchanged     Progression   Progression Continue to progress workloads to maintain intensity without signs/symptoms of physical distress.     Resistance Training   Training  Prescription Yes   Weight blue bands   Reps 10-15   Time 10 Minutes     Interval Training   Interval Training No     Bike   Level 0.5   Minutes 17     NuStep   Level 5   Minutes 17   METs 2.4     Stairs   Minutes 17      History  Smoking Status  . Never Smoker  Smokeless Tobacco  . Never Used    Goals Met:  Independence with exercise equipment Improved SOB with ADL's Using PLB without cueing & demonstrates good technique Exercise tolerated well No report of cardiac concerns or symptoms Strength training completed today  Goals Unmet:  Not Applicable  Comments: Service time is from 1030 to 1200   Dr. Rush Farmer is Medical Director for Pulmonary Rehab at [facility].

## 2017-05-25 ENCOUNTER — Encounter (HOSPITAL_COMMUNITY)
Admission: RE | Admit: 2017-05-25 | Discharge: 2017-05-25 | Disposition: A | Discharge status: Home / Self Care | Payer: Medicare Other | Source: Ambulatory Visit | Attending: Internal Medicine | Admitting: Internal Medicine

## 2017-05-25 ENCOUNTER — Other Ambulatory Visit (INDEPENDENT_AMBULATORY_CARE_PROVIDER_SITE_OTHER): Payer: Medicare Other

## 2017-05-25 ENCOUNTER — Encounter (HOSPITAL_COMMUNITY): Payer: Medicare Other

## 2017-05-25 VITALS — Wt 261.0 lb

## 2017-05-25 DIAGNOSIS — J84112 Idiopathic pulmonary fibrosis: Secondary | ICD-10-CM

## 2017-05-25 LAB — HEPATIC FUNCTION PANEL
ALT: 169 U/L — ABNORMAL HIGH (ref 0–53)
AST: 88 U/L — ABNORMAL HIGH (ref 0–37)
Albumin: 3.9 g/dL (ref 3.5–5.2)
Alkaline Phosphatase: 89 U/L (ref 39–117)
Bilirubin, Direct: 0.1 mg/dL (ref 0.0–0.3)
Total Bilirubin: 0.5 mg/dL (ref 0.2–1.2)
Total Protein: 7.8 g/dL (ref 6.0–8.3)

## 2017-05-25 NOTE — Progress Notes (Signed)
Daily Session Note  Patient Details  Name: CLEARENCE VITUG MRN: [id number] Date of Birth: [birth year] Referring Provider:     Pulmonary Rehab Walk Test from 03/30/2017 in [place]  Referring Provider  Dr. Chase Caller      Encounter Date: 05/25/2017  Check In:     Session Check In - 05/25/17 1030      Check-In   Location [facility]   Staff Present Trish Fountain, RN, Maxcine Ham, RN, BSN;Molly diVincenzo, MS, ACSM RCEP, Exercise Physiologist   Supervising physician immediately available to respond to emergencies [facility] immediately available   Physician(s) Dr. Allyson Sabal   Medication changes reported     No   Fall or balance concerns reported    No   Tobacco Cessation No Change   Warm-up and Cool-down Performed as group-led instruction   Resistance Training Performed Yes   VAD Patient? No     Pain Assessment   Currently in Pain? No/denies   Multiple Pain Sites No      Capillary Blood Glucose: No results found for this or any previous visit (from the past 24 hour(s)).      Exercise Prescription Changes - 05/25/17 1200      Response to Exercise   Blood Pressure (Admit) 104/62   Blood Pressure (Exercise) 100/60   Blood Pressure (Exit) 100/66   Heart Rate (Admit) 71 bpm   Heart Rate (Exercise) 80 bpm   Heart Rate (Exit) 72 bpm   Oxygen Saturation (Admit) 98 %   Oxygen Saturation (Exercise) 94 %   Oxygen Saturation (Exit) 98 %   Rating of Perceived Exertion (Exercise) 13   Perceived Dyspnea (Exercise) 1   Duration Progress to 45 minutes of aerobic exercise without signs/symptoms of physical distress   Intensity THRR unchanged     Progression   Progression Continue to progress workloads to maintain intensity without signs/symptoms of physical distress.     Resistance Training   Training Prescription Yes   Weight blue bands   Reps 10-15   Time 10 Minutes     Interval Training   Interval Training No     Bike   Level 0.5   Minutes 17     Stairs   Minutes 17      History  Smoking Status  . Never Smoker  Smokeless Tobacco  . Never Used    Goals Met:  Exercise tolerated well No report of cardiac concerns or symptoms Strength training completed today  Goals Unmet:  Not Applicable  Comments: Service time is from 10:30a to 12:20p    Dr. Rush Farmer is Medical Director for Pulmonary Rehab at [facility].

## 2017-05-26 ENCOUNTER — Telehealth: Payer: Self-pay | Admitting: Internal Medicine

## 2017-05-26 DIAGNOSIS — J84112 Idiopathic pulmonary fibrosis: Secondary | ICD-10-CM

## 2017-05-26 NOTE — Telephone Encounter (Signed)
AST still climbing and is now 2.4 X normal. If he is off the crestor and this lab 6/21 was off crestor, then he should back down esbriet to 2 tab tid and recheck LFT in 1 more week  Also, please check if he is taking tylenol scheduled. If so why?   URGENT so goes to triage  Thanks  Dr. Brand Males, M.D., [facility].C.P Pulmonary and Critical Care Medicine Staff Physician [place] [facility] Pager: [phone], If no answer or between  15:00h - 7:00h: call [phone]  05/26/2017 12:42 PM       Recent Labs Lab 05/25/17 1242  AST 88*  ALT 169*  ALKPHOS 89  BILITOT 0.5  PROT 7.8  ALBUMIN 3.9   Results for Shane [name], Shane [name] (MRN [id number]) as of 05/26/2017 12:38  Ref. Range 12/27/2016 03:34 12/27/2016 05:30 12/28/2016 05:30 05/05/2017 09:41 05/25/2017 12:42  AST Latest Ref Range: 0 - 37 U/L   20 74 (H) 88 (H)  ALT Latest Ref Range: 0 - 53 U/L   19 175 (H) 169 (H)     Current Outpatient Prescriptions:  .  acetaminophen (TYLENOL) 500 MG tablet, Take 500 mg by mouth every 6 (six) hours as needed for moderate pain., Disp: , Rfl:  .  Ascorbic Acid (VITAMIN C) 1000 MG tablet, Take 1,000 mg by mouth daily., Disp: , Rfl:  .  aspirin 81 MG tablet, Take 81 mg by mouth daily.  , Disp: , Rfl:  .  clotrimazole-betamethasone (LOTRISONE) cream, APPLY TO AFFECTED AREA TWICE A DAY, Disp: , Rfl: 0 .  diazepam (VALIUM) 5 MG tablet, Take 5 mg by mouth at bedtime as needed (for sleep). , Disp: , Rfl:  .  furosemide (LASIX) 40 MG tablet, Take 40 mg by mouth daily. , Disp: , Rfl:  .  insulin detemir (LEVEMIR) 100 UNIT/ML injection, Inject 22 Units into the skin at bedtime., Disp: , Rfl:  .  insulin lispro (HUMALOG KWIKPEN) 100 UNIT/ML injection, Inject 20 Units into the skin 3 (three) times daily before meals. Sliding scale, Disp: , Rfl:  .  Insulin Pump Disposable (V-GO 40) KIT, 40 Units by Does not apply route daily. humalog 100 units dispensed every 24 hours.  Dispose v-go bag after 24 hours of use. , Disp: , Rfl:  .  KLOR-CON M20 20 MEQ tablet, Take 20 mEq by mouth daily. , Disp: , Rfl:  .  lisinopril (PRINIVIL,ZESTRIL) 20 MG tablet, Take 20 mg by mouth daily. , Disp: , Rfl:  .  Multiple Vitamin (MULTIVITAMIN) tablet, Take 1 tablet by mouth daily.  , Disp: , Rfl:  .  nebivolol (BYSTOLIC) 5 MG tablet, Take 5 mg by mouth daily., Disp: , Rfl:  .  nitroGLYCERIN (NITROSTAT) 0.4 MG SL tablet, Place 0.4 mg under the tongue every 5 (five) minutes as needed. For chest pain, Disp: , Rfl:  .  pantoprazole (PROTONIX) 40 MG tablet, Take 40 mg by mouth daily.  , Disp: , Rfl:  .  Pirfenidone (ESBRIET) 267 MG CAPS, Take 3 capsules by mouth 3 (three) times daily., Disp: 270 capsule, Rfl: 5 .  PROAIR HFA 108 (90 Base) MCG/ACT inhaler, Inhale 2 puffs into the lungs every 4 (four) hours as needed for wheezing or shortness of breath. , Disp: , Rfl:  .  promethazine-phenylephrine (PROMETHAZINE VC PLAIN) 6.25-5 MG/5ML SYRP, Take 5 mLs by mouth every 4 (four) hours as needed for congestion., Disp: , Rfl:  .  Rosuvastatin Calcium (CRESTOR PO), Take by mouth daily., Disp: , Rfl:

## 2017-05-26 NOTE — Telephone Encounter (Signed)
Spoke with pt's wife who just wanted to know MR's message as well. Informed her and she had no further questions at this time. Nothing further is needed.

## 2017-05-26 NOTE — Telephone Encounter (Signed)
MR  Spoke with pt and he understood your message. He is aware of the change in how he takes his esbriet and he agreed to the repeat blood work.  He also stated he does not take tylenol at all

## 2017-05-26 NOTE — Telephone Encounter (Signed)
Pt wife returning call ancan be reached [phone] she says this is a medical office  And said to just tell them that she is expecting call from [place].Shane [name]

## 2017-05-30 ENCOUNTER — Encounter (HOSPITAL_COMMUNITY)
Admission: RE | Admit: 2017-05-30 | Discharge: 2017-05-30 | Disposition: A | Discharge status: Home / Self Care | Payer: Medicare Other | Source: Ambulatory Visit | Attending: Internal Medicine | Admitting: Internal Medicine

## 2017-05-30 ENCOUNTER — Encounter (HOSPITAL_COMMUNITY): Payer: Medicare Other

## 2017-05-30 VITALS — Wt 262.1 lb

## 2017-05-30 DIAGNOSIS — J84112 Idiopathic pulmonary fibrosis: Secondary | ICD-10-CM | POA: Diagnosis not present

## 2017-05-30 NOTE — Progress Notes (Signed)
Daily Session Note  Patient Details  Name: Shane [name] MRN: [id number] Date of Birth: 1941-01-12 Referring Provider:     Pulmonary Rehab Walk Test from 03/30/2017 in [place]  Referring Provider  Dr. Chase Caller      Encounter Date: 05/30/2017  Check In:     Session Check In - 05/30/17 1013      Check-In   Location [facility]   Staff Present Rosebud Poles, RN, BSN;Molly diVincenzo, MS, ACSM RCEP, Exercise Physiologist;Lisa Ysidro Evert, RN   Supervising physician immediately available to respond to emergencies [facility] immediately available   Physician(s) Dr. Allyson Sabal   Medication changes reported     No   Fall or balance concerns reported    No   Tobacco Cessation No Change   Warm-up and Cool-down Performed as group-led instruction   Resistance Training Performed Yes   VAD Patient? No     Pain Assessment   Currently in Pain? No/denies   Multiple Pain Sites No      Capillary Blood Glucose: No results found for this or any previous visit (from the past 24 hour(s)).     POCT Glucose - 05/30/17 1217      POCT Blood Glucose   Pre-Exercise 159 mg/dL   Post-Exercise 127 mg/dL         Exercise Prescription Changes - 05/30/17 1200      Response to Exercise   Blood Pressure (Admit) 106/60   Blood Pressure (Exercise) 120/54   Blood Pressure (Exit) 90/40   Heart Rate (Admit) 70 bpm   Heart Rate (Exercise) 84 bpm   Heart Rate (Exit) 78 bpm   Oxygen Saturation (Admit) 99 %   Oxygen Saturation (Exercise) 95 %   Oxygen Saturation (Exit) 98 %   Rating of Perceived Exertion (Exercise) 13   Perceived Dyspnea (Exercise) 1   Duration Progress to 45 minutes of aerobic exercise without signs/symptoms of physical distress   Intensity THRR unchanged     Progression   Progression Continue to progress workloads to maintain intensity without signs/symptoms of physical distress.     Resistance Training   Training Prescription  Yes   Weight blue bands   Reps 10-15   Time 10 Minutes     Interval Training   Interval Training No     Bike   Level 0.8   Minutes 17     NuStep   Level 5   Minutes 17   METs 2.4     Stairs   Minutes 17      History  Smoking Status  . Never Smoker  Smokeless Tobacco  . Never Used    Goals Met:  Exercise tolerated well No report of cardiac concerns or symptoms Strength training completed today  Goals Unmet:  Not Applicable  Comments: Service time is from 10:30a to 12:05p    Dr. Rush Farmer is Medical Director for Pulmonary Rehab at [facility].

## 2017-05-30 NOTE — Telephone Encounter (Signed)
Thanks for notification  Dr. Brand Males, M.D., [facility].C.P Pulmonary and Critical Care Medicine Staff Physician [place] [facility] Pager: [phone], If no answer or between  15:00h - 7:00h: call [phone]  05/30/2017 3:53 AM

## 2017-06-01 ENCOUNTER — Encounter (HOSPITAL_COMMUNITY): Payer: Medicare Other

## 2017-06-01 ENCOUNTER — Encounter (HOSPITAL_COMMUNITY)
Admission: RE | Admit: 2017-06-01 | Discharge: 2017-06-01 | Disposition: A | Discharge status: Home / Self Care | Payer: Medicare Other | Source: Ambulatory Visit | Attending: Internal Medicine | Admitting: Internal Medicine

## 2017-06-01 DIAGNOSIS — J84112 Idiopathic pulmonary fibrosis: Secondary | ICD-10-CM | POA: Diagnosis not present

## 2017-06-01 NOTE — Progress Notes (Signed)
Daily Session Note  Patient Details  Name: Shane [name] MRN: [id number] Date of Birth: June 01, 1941 Referring Provider:     Pulmonary Rehab Walk Test from 03/30/2017 in [place]  Referring Provider  Dr. Chase Caller      Encounter Date: 06/01/2017  Check In:     Session Check In - 06/01/17 1111      Check-In   Location [facility]   Staff Present Su Hilt, MS, ACSM RCEP, Exercise Physiologist;Lisa Colletta [place], RN, [place] physician immediately available to respond to emergencies [facility] immediately available   Physician(s) Dr. Allyson Sabal   Medication changes reported     No   Fall or balance concerns reported    No   Tobacco Cessation No Change   Warm-up and Cool-down Performed as group-led instruction   Resistance Training Performed Yes   VAD Patient? No     Pain Assessment   Currently in Pain? No/denies   Multiple Pain Sites No      Capillary Blood Glucose: No results found for this or any previous visit (from the past 24 hour(s)).     POCT Glucose - 06/01/17 1214      POCT Blood Glucose   Pre-Exercise 129 mg/dL   Post-Exercise 110 mg/dL         Exercise Prescription Changes - 06/01/17 1200      Response to Exercise   Blood Pressure (Admit) 114/50   Blood Pressure (Exercise) 126/60   Blood Pressure (Exit) 108/72   Heart Rate (Admit) 69 bpm   Heart Rate (Exercise) 76 bpm   Heart Rate (Exit) 72 bpm   Oxygen Saturation (Admit) 98 %   Oxygen Saturation (Exercise) 98 %   Oxygen Saturation (Exit) 96 %   Rating of Perceived Exertion (Exercise) 11   Perceived Dyspnea (Exercise) 1   Duration Progress to 45 minutes of aerobic exercise without signs/symptoms of physical distress   Intensity THRR unchanged     Progression   Progression Continue to progress workloads to maintain intensity without signs/symptoms of physical distress.     Resistance Training   Training  Prescription Yes   Weight blue bands   Reps 10-15   Time 10 Minutes     Interval Training   Interval Training No     Bike   Level 0.8   Minutes 17     NuStep   Level 5   Minutes 17   METs 2.4      History  Smoking Status  . Never Smoker  Smokeless Tobacco  . Never Used    Goals Met:  Exercise tolerated well No report of cardiac concerns or symptoms Strength training completed today  Goals Unmet:  Not Applicable  Comments: Service time is from 10:30a to 12:05p    Dr. Rush Farmer is Medical Director for Pulmonary Rehab at [facility].

## 2017-06-02 ENCOUNTER — Telehealth: Payer: Self-pay | Admitting: Internal Medicine

## 2017-06-02 ENCOUNTER — Other Ambulatory Visit (INDEPENDENT_AMBULATORY_CARE_PROVIDER_SITE_OTHER): Payer: Medicare Other

## 2017-06-02 DIAGNOSIS — J84112 Idiopathic pulmonary fibrosis: Secondary | ICD-10-CM | POA: Diagnosis not present

## 2017-06-02 LAB — HEPATIC FUNCTION PANEL
ALT: 220 U/L — ABNORMAL HIGH (ref 0–53)
AST: 107 U/L — ABNORMAL HIGH (ref 0–37)
Albumin: 3.9 g/dL (ref 3.5–5.2)
Alkaline Phosphatase: 94 U/L (ref 39–117)
Bilirubin, Direct: 0.1 mg/dL (ref 0.0–0.3)
Total Bilirubin: 0.7 mg/dL (ref 0.2–1.2)
Total Protein: 8.3 g/dL (ref 6.0–8.3)

## 2017-06-02 NOTE — Telephone Encounter (Signed)
Called and spoke to pt. Informed him of the results and recs per MR. Pt states he is taking Esbriet 2 tabs TID. Advised pt to stop taking Esbriet and to recheck lft on 06/08/2017. Order placed. Pt verbalized understanding and denied any further questions or concerns at this time.

## 2017-06-02 NOTE — Telephone Encounter (Signed)
  Recent Labs Lab 06/02/17 0909  AST 107*  ALT 220*  ALKPHOS 94  BILITOT 0.7  PROT 8.3  ALBUMIN 3.9     RISIGN lft -   - ensur this was off crestor and on lower dose esbriet? If so, then stop esbriet alltogether  - repeat LFT 06/08/17 - Thursday AM/PM - so I have time to respond by Friday   Dr. Brand Males, M.D., [facility].C.P Pulmonary and Critical Care Medicine Staff Physician [place] [facility] Pager: [phone], If no answer or between  15:00h - 7:00h: call [phone]  06/02/2017 5:07 PM

## 2017-06-05 DIAGNOSIS — G4733 Obstructive sleep apnea (adult) (pediatric): Secondary | ICD-10-CM | POA: Diagnosis not present

## 2017-06-06 ENCOUNTER — Other Ambulatory Visit: Payer: Self-pay | Admitting: *Deleted

## 2017-06-06 ENCOUNTER — Encounter (HOSPITAL_COMMUNITY)
Admission: RE | Admit: 2017-06-06 | Discharge: 2017-06-06 | Disposition: A | Discharge status: Home / Self Care | Payer: Medicare Other | Source: Ambulatory Visit | Attending: Internal Medicine | Admitting: Internal Medicine

## 2017-06-06 ENCOUNTER — Encounter (HOSPITAL_COMMUNITY): Payer: Medicare Other

## 2017-06-06 VITALS — Wt 262.3 lb

## 2017-06-06 DIAGNOSIS — Z7982 Long term (current) use of aspirin: Secondary | ICD-10-CM | POA: Insufficient documentation

## 2017-06-06 DIAGNOSIS — G4730 Sleep apnea, unspecified: Secondary | ICD-10-CM | POA: Diagnosis not present

## 2017-06-06 DIAGNOSIS — N189 Chronic kidney disease, unspecified: Secondary | ICD-10-CM | POA: Diagnosis not present

## 2017-06-06 DIAGNOSIS — Z794 Long term (current) use of insulin: Secondary | ICD-10-CM | POA: Insufficient documentation

## 2017-06-06 DIAGNOSIS — E1122 Type 2 diabetes mellitus with diabetic chronic kidney disease: Secondary | ICD-10-CM | POA: Diagnosis not present

## 2017-06-06 DIAGNOSIS — K219 Gastro-esophageal reflux disease without esophagitis: Secondary | ICD-10-CM | POA: Diagnosis not present

## 2017-06-06 DIAGNOSIS — Z79899 Other long term (current) drug therapy: Secondary | ICD-10-CM | POA: Diagnosis not present

## 2017-06-06 DIAGNOSIS — G4733 Obstructive sleep apnea (adult) (pediatric): Secondary | ICD-10-CM | POA: Diagnosis not present

## 2017-06-06 DIAGNOSIS — J84112 Idiopathic pulmonary fibrosis: Secondary | ICD-10-CM | POA: Diagnosis present

## 2017-06-06 DIAGNOSIS — I129 Hypertensive chronic kidney disease with stage 1 through stage 4 chronic kidney disease, or unspecified chronic kidney disease: Secondary | ICD-10-CM | POA: Diagnosis not present

## 2017-06-06 DIAGNOSIS — E785 Hyperlipidemia, unspecified: Secondary | ICD-10-CM | POA: Insufficient documentation

## 2017-06-06 NOTE — Progress Notes (Signed)
Daily Session Note  Patient Details  Name: Shane [name] MRN: [id number] Date of Birth: 09-27-1941 Referring Provider:     Pulmonary Rehab Walk Test from 03/30/2017 in [place]  Referring Provider  Dr. Chase Caller      Encounter Date: 06/06/2017  Check In:     Session Check In - 06/06/17 1032      Check-In   Location [facility]   Staff Present Trish Fountain, RN, Maxcine Ham, RN, BSN;Lisa Hughes, RN;Molly diVincenzo, MS, ACSM RCEP, Exercise Physiologist   Supervising physician immediately available to respond to emergencies [facility] immediately available   Physician(s) Dr. Allyson Sabal   Medication changes reported     No   Fall or balance concerns reported    No   Tobacco Cessation No Change   Warm-up and Cool-down Performed as group-led instruction   Resistance Training Performed Yes   VAD Patient? No     VAD patient   Has back up controller? No     Pain Assessment   Currently in Pain? Other (Comment)   Multiple Pain Sites No      Capillary Blood Glucose: No results found for this or any previous visit (from the past 24 hour(s)).      Exercise Prescription Changes - 06/06/17 1200      Response to Exercise   Blood Pressure (Admit) 104/56   Blood Pressure (Exercise) 110/54   Blood Pressure (Exit) 98/60   Heart Rate (Admit) 75 bpm   Heart Rate (Exercise) 84 bpm   Heart Rate (Exit) 74 bpm   Oxygen Saturation (Admit) 97 %   Oxygen Saturation (Exercise) 94 %   Oxygen Saturation (Exit) 97 %   Rating of Perceived Exertion (Exercise) 13   Perceived Dyspnea (Exercise) 1   Duration Progress to 45 minutes of aerobic exercise without signs/symptoms of physical distress   Intensity THRR unchanged     Progression   Progression Continue to progress workloads to maintain intensity without signs/symptoms of physical distress.     Resistance Training   Training Prescription Yes   Weight blue bands   Reps 10-15   Time 10 Minutes     Interval Training   Interval Training No     Bike   Level 0.8   Minutes 17     NuStep   Level 6   Minutes 17   METs 2.6     Stairs   Minutes 17      History  Smoking Status  . Never Smoker  Smokeless Tobacco  . Never Used    Goals Met:  Exercise tolerated well Strength training completed today  Goals Unmet:  Not Applicable  Comments: Service time is from 1030 to 1210    Dr. Rush Farmer is Medical Director for Pulmonary Rehab at [facility].

## 2017-06-08 ENCOUNTER — Encounter (HOSPITAL_COMMUNITY)
Admission: RE | Admit: 2017-06-08 | Discharge: 2017-06-08 | Disposition: A | Discharge status: Home / Self Care | Payer: Medicare Other | Source: Ambulatory Visit | Attending: Internal Medicine | Admitting: Internal Medicine

## 2017-06-08 ENCOUNTER — Encounter (HOSPITAL_COMMUNITY): Payer: Medicare Other

## 2017-06-08 VITALS — Wt 261.2 lb

## 2017-06-08 DIAGNOSIS — J84112 Idiopathic pulmonary fibrosis: Secondary | ICD-10-CM

## 2017-06-08 NOTE — Progress Notes (Signed)
Pulmonary Individual Treatment Plan  Patient Details  Name: Shane [name] MRN: [id number] Date of Birth: [birth year] Referring Provider:     Pulmonary Rehab Walk Test from 03/30/2017 in [place]  Referring Provider  Dr. Chase Caller      Initial Encounter Date:    Pulmonary Rehab Walk Test from 03/30/2017 in [place]  Date  03/31/17  Referring Provider  Dr. Chase Caller      Visit Diagnosis: 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[place])  Patient's Home Medications on Admission:   Current Outpatient Prescriptions:  .  acetaminophen (TYLENOL) 500 MG tablet, Take 500 mg by mouth every 6 (six) hours as needed for moderate pain., Disp: , Rfl:  .  Ascorbic Acid (VITAMIN C) 1000 MG tablet, Take 1,000 mg by mouth daily., Disp: , Rfl:  .  aspirin 81 MG tablet, Take 81 mg by mouth daily.  , Disp: , Rfl:  .  clotrimazole-betamethasone (LOTRISONE) cream, APPLY TO AFFECTED AREA TWICE A DAY, Disp: , Rfl: 0 .  diazepam (VALIUM) 5 MG tablet, Take 5 mg by mouth at bedtime as needed (for sleep). , Disp: , Rfl:  .  furosemide (LASIX) 40 MG tablet, Take 40 mg by mouth daily. , Disp: , Rfl:  .  insulin detemir (LEVEMIR) 100 UNIT/ML injection, Inject 22 Units into the skin at bedtime., Disp: , Rfl:  .  insulin lispro (HUMALOG KWIKPEN) 100 UNIT/ML injection, Inject 20 Units into the skin 3 (three) times daily before meals. Sliding scale, Disp: , Rfl:  .  Insulin Pump Disposable (V-GO 40) KIT, 40 Units by Does not apply route daily. humalog 100 units dispensed every 24 hours. Dispose v-go bag after 24 hours of use. , Disp: , Rfl:  .  KLOR-CON M20 20 MEQ tablet, Take 20 mEq by mouth daily. , Disp: , Rfl:  .  lisinopril (PRINIVIL,ZESTRIL) 20 MG tablet, Take 20 mg by mouth daily. , Disp: , Rfl:  .  Multiple Vitamin (MULTIVITAMIN) tablet, Take 1 tablet by mouth daily.  , Disp: , Rfl:  .  nebivolol (BYSTOLIC) 5 MG tablet, Take 5 mg by mouth daily., Disp: ,  Rfl:  .  nitroGLYCERIN (NITROSTAT) 0.4 MG SL tablet, Place 0.4 mg under the tongue every 5 (five) minutes as needed. For chest pain, Disp: , Rfl:  .  pantoprazole (PROTONIX) 40 MG tablet, Take 40 mg by mouth daily.  , Disp: , Rfl:  .  Pirfenidone (ESBRIET) 267 MG CAPS, Take 3 capsules by mouth 3 (three) times daily., Disp: 270 capsule, Rfl: 5 .  PROAIR HFA 108 (90 Base) MCG/ACT inhaler, Inhale 2 puffs into the lungs every 4 (four) hours as needed for wheezing or shortness of breath. , Disp: , Rfl:  .  promethazine-phenylephrine (PROMETHAZINE VC PLAIN) 6.25-5 MG/5ML SYRP, Take 5 mLs by mouth every 4 (four) hours as needed for congestion., Disp: , Rfl:  .  Rosuvastatin Calcium (CRESTOR PO), Take by mouth daily., Disp: , Rfl:   Past Medical History: Past Medical History:  Diagnosis Date  . 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 Cataract   . Chest pain   . Chronic kidney disease    d/t DM  . Diabetes mellitus    Vgo disposal insulin bolus  simular to insulin pump  . Dyspnea   . GERD (gastroesophageal reflux disease)   . Hyperlipidemia   . Hypertension   . Neuromuscular disorder ([place])   . PONV (postoperative nausea and vomiting)   .  Sleep apnea     uses cpap asked to bring mask and tubing    Tobacco Use: History  Smoking Status  . Never Smoker  Smokeless Tobacco  . Never Used    Labs: Recent Review Flowsheet Data    Labs for ITP Cardiac and Pulmonary Rehab Latest Ref Rng & Units 11/09/2016 11/09/2016 12/19/2016 12/26/2016 12/27/2016   Cholestrol 0 - 200 mg/dL - - - - -   LDLCALC 0 - 99 mg/dL - - - - -   HDL >39 mg/dL - - - - -   Trlycerides <150 mg/dL - - - - -   Hemoglobin A1c 4.8 - 5.6 % - - 7.9(H) - -   PHART 7.350 - 7.450 - - TEST WILL BE CREDITED 7.376 7.362   PCO2ART 32.0 - 48.0 mmHg - - TEST WILL BE CREDITED 44.0 45.4   HCO3 20.0 - 28.0 mmol/L 28.0 30.0(H) TEST WILL BE CREDITED 25.2 25.1   TCO2 0 - 100 mmol/L 29 31 - - -   ACIDBASEDEF 0.0 - 2.0 mmol/L - - TEST WILL BE CREDITED - -    O2SAT % 59.0 61.0 TEST WILL BE CREDITED 93.9 95.7      Capillary Blood Glucose: Lab Results  Component Value Date   GLUCAP 191 (H) 12/29/2016   GLUCAP 243 (H) 12/28/2016   GLUCAP 233 (H) 12/28/2016   GLUCAP 238 (H) 12/28/2016   GLUCAP 270 (H) 12/28/2016       POCT Glucose    Row Name 04/11/17 1237 04/25/17 1210 05/02/17 1215 05/04/17 1237 05/11/17 1222     POCT Blood Glucose   Pre-Exercise 163 mg/dL 207 mg/dL 185 mg/dL 212 mg/dL 130 mg/dL   Post-Exercise 155 mg/dL 112 mg/dL 132 mg/dL 151 mg/dL 107 mg/dL   Row Name 05/16/17 1203 05/18/17 1233 05/23/17 1214 05/25/17 1239 05/30/17 1217     POCT Blood Glucose   Pre-Exercise 170 mg/dL 127 mg/dL 166 mg/dL 170 mg/dL 159 mg/dL   Post-Exercise 119 mg/dL 128 mg/dL 102 mg/dL 127 mg/dL 127 mg/dL   Row Name 06/01/17 1214 06/08/17 1226           POCT Blood Glucose   Pre-Exercise 129 mg/dL 205 mg/dL      Post-Exercise 110 mg/dL 149 mg/dL         ADL UCSD:     Pulmonary Assessment Scores    Row Name 04/12/17 1200         ADL UCSD   ADL Phase Entry     SOB Score total 65       CAT Score   CAT Score 24  Pre        Pulmonary Function Assessment:     Pulmonary Function Assessment - 03/27/17 1108      Breath   Bilateral Breath Sounds Other  fine crackles in lower lung fields bilat   Shortness of Breath Yes;Limiting activity      Exercise Target Goals:    Exercise Program Goal: Individual exercise prescription set with THRR, safety & activity barriers. Participant demonstrates ability to understand and report RPE using BORG scale, to self-measure pulse accurately, and to acknowledge the importance of the exercise prescription.  Exercise Prescription Goal: Starting with aerobic activity 30 plus minutes a day, 3 days per week for initial exercise prescription. Provide home exercise prescription and guidelines that participant acknowledges understanding prior to discharge.  Activity Barriers & Risk Stratification:      Activity Barriers & Cardiac Risk Stratification - 03/27/17 1031  Activity Barriers & Cardiac Risk Stratification   Activity Barriers Shortness of Breath;Neck/Spine Problems;Back Problems;Balance Concerns;History of Falls;Assistive Device;Deconditioning      6 Minute Walk:     6 Minute Walk    Row Name 03/31/17 1104         6 Minute Walk   Phase Initial     Distance 600 feet     Walk Time 6 minutes     # of Rest Breaks 0     MPH 1.13     METS 1.84     RPE 14     Perceived Dyspnea  3     Symptoms Yes (comment)     Comments foot drag, unstable even with assistive device     Resting HR 72 bpm     Resting BP 104/98     Max Ex. HR 85 bpm     Max Ex. BP 134/50       Interval HR   Baseline HR 72     1 Minute HR 84     2 Minute HR 80     3 Minute HR 85     4 Minute HR 95     5 Minute HR 85     6 Minute HR 81     2 Minute Post HR 74     Interval Heart Rate? Yes       Interval Oxygen   Interval Oxygen? Yes     Baseline Oxygen Saturation % 97 %     Baseline Liters of Oxygen 0 L     1 Minute Oxygen Saturation % 94 %     1 Minute Liters of Oxygen 0 L     2 Minute Oxygen Saturation % 91 %     2 Minute Liters of Oxygen 0 L     3 Minute Oxygen Saturation % 93 %     3 Minute Liters of Oxygen 0 L     4 Minute Oxygen Saturation % 93 %     4 Minute Liters of Oxygen 0 L     5 Minute Oxygen Saturation % 93 %     5 Minute Liters of Oxygen 0 L     6 Minute Oxygen Saturation % 95 %     6 Minute Liters of Oxygen 0 L     2 Minute Post Oxygen Saturation % 97 %     2 Minute Post Liters of Oxygen 0 L        Oxygen Initial Assessment:     Oxygen Initial Assessment - 03/31/17 1113      Initial 6 min Walk   Oxygen Used None   Resting Oxygen Saturation  during 6 min walk 91 %     Program Oxygen Prescription   Program Oxygen Prescription None      Oxygen Re-Evaluation:     Oxygen Re-Evaluation    Row Name 04/11/17 1447 05/09/17 0737 06/05/17 1532          Program Oxygen Prescription   Program Oxygen Prescription None None None       Home Oxygen   Home Oxygen Device None None None        Oxygen Discharge (Final Oxygen Re-Evaluation):     Oxygen Re-Evaluation - 06/05/17 1532      Program Oxygen Prescription   Program Oxygen Prescription None     Home Oxygen   Home Oxygen Device None      Initial Exercise Prescription:  Initial Exercise Prescription - 03/31/17 1100      Date of Initial Exercise RX and Referring Provider   Date 03/31/17   Referring Provider Dr. Chase Caller     NuStep   Level 2   Minutes 34   METs 1.5     Stairs   Sets 5   Minutes 17     Prescription Details   Frequency (times per week) 2   Duration Progress to 45 minutes of aerobic exercise without signs/symptoms of physical distress     Intensity   THRR 40-80% of Max Heartrate 58-116   Ratings of Perceived Exertion 11-13   Perceived Dyspnea 0-4     Progression   Progression Continue progressive overload as per policy without signs/symptoms or physical distress.     Resistance Training   Training Prescription Yes   Weight blue bands   Reps 10-15      Perform Capillary Blood Glucose checks as needed.  Exercise Prescription Changes:     Exercise Prescription Changes    Row Name 04/11/17 1200 04/13/17 1200 04/18/17 1200 04/20/17 1200 04/25/17 1200     Response to Exercise   Blood Pressure (Admit) 116/64 104/56 104/50 124/54 102/44   Blood Pressure (Exercise) 142/52 142/52 124/54 122/74 132/70   Blood Pressure (Exit) 108/64 114/58 100/58 100/46 100/46   Heart Rate (Admit) 63 bpm 56 bpm 61 bpm 70 bpm 63 bpm   Heart Rate (Exercise) 72 bpm 65 bpm 83 bpm 65 bpm 81 bpm   Heart Rate (Exit) 68 bpm 57 bpm 69 bpm 61 bpm 70 bpm   Oxygen Saturation (Admit) 95 % 98 % 97 % 97 % 98 %   Oxygen Saturation (Exercise) 95 % 99 % 98 % 97 % 94 %   Oxygen Saturation (Exit) 97 % 99 % 97 % 98 % 97 %   Rating of Perceived Exertion (Exercise) _0 Perceived Dyspnea (Exercise) _1 Duration Progress to 45 minutes of aerobic exercise without signs/symptoms of physical distress Progress to 45 minutes of aerobic exercise without signs/symptoms of physical distress Progress to 45 minutes of aerobic exercise without signs/symptoms of physical distress Progress to 45 minutes of aerobic exercise without signs/symptoms of physical distress Progress to 45 minutes of aerobic exercise without signs/symptoms of physical distress   Intensity Other (comment)  % of HRR- THRR unchanged THRR unchanged THRR unchanged THRR unchanged     Progression   Progression Continue to progress workloads to maintain intensity without signs/symptoms of physical distress. Continue to progress workloads to maintain intensity without signs/symptoms of physical distress. Continue to progress workloads to maintain intensity without signs/symptoms of physical distress. Continue to progress workloads to maintain intensity without signs/symptoms of physical distress. Continue to progress workloads to maintain intensity without signs/symptoms of physical distress.     Resistance Training   Training Prescription _2    Weight _3    Reps 10-15 10-15 10-15 10-15 10-15   Time 10 Minutes 10 Minutes 10 Minutes 10 Minutes 10 Minutes     Interval Training   Interval Training  - No No No No     NuStep   Level _4 Minutes 34 34 34 34 34   METs 1.9 1.8 1.9 2.1 2.1     Stairs   Minutes 71  - 41  - 17   Row Name  04/27/17 1200 05/02/17 1200 05/04/17 1200 05/09/17 1500 05/11/17 1218     Response to Exercise   Blood Pressure (Admit) 104/60 124/66 110/52 96/42 102/50   Blood Pressure (Exercise) 100/60 136/70 124/68 127/57 120/50   Blood Pressure (Exit) 92/54 96/54 106/54 94/46 100/56   Heart Rate (Admit) 72 bpm 75 bpm 78 bpm 74 bpm 62 bpm   Heart Rate (Exercise) 82 bpm 89 bpm 80 bpm 95 bpm 88 bpm    Heart Rate (Exit) 69 bpm 76 bpm 73 bpm 80 bpm 77 bpm   Oxygen Saturation (Admit) 98 % 97 % 100 % 97 % 99 %   Oxygen Saturation (Exercise) 98 % 93 % 93 % 90 % 92 %   Oxygen Saturation (Exit) 98 % 97 % 97 % 96 % 97 %   Rating of Perceived Exertion (Exercise) _0 Perceived Dyspnea (Exercise) _1 Duration Progress to 45 minutes of aerobic exercise without signs/symptoms of physical distress Progress to 45 minutes of aerobic exercise without signs/symptoms of physical distress Progress to 45 minutes of aerobic exercise without signs/symptoms of physical distress Progress to 45 minutes of aerobic exercise without signs/symptoms of physical distress Progress to 45 minutes of aerobic exercise without signs/symptoms of physical distress   Intensity _2      Progression   Progression Continue to progress workloads to maintain intensity without signs/symptoms of physical distress. Continue to progress workloads to maintain intensity without signs/symptoms of physical distress. Continue to progress workloads to maintain intensity without signs/symptoms of physical distress. Continue to progress workloads to maintain intensity without signs/symptoms of physical distress. Continue to progress workloads to maintain intensity without signs/symptoms of physical distress.     Resistance Training   Training Prescription _3    Weight _4    Reps 10-15 10-15 10-15 10-15 10-15   Time 10 Minutes 10 Minutes 10 Minutes 10 Minutes 10 Minutes     Interval Training   Interval Training _5      NuStep   Level _6 Minutes 34 34 17 34 34   METs 2.5 2.9 2.4 2 2.2     Stairs   Minutes _7 Row Name 05/16/17 1200 05/18/17 1200 05/23/17 1212 05/25/17 1200 05/30/17 1200     Response to Exercise   Blood Pressure (Admit) 90/58 102/52 98/50  104/62 106/60   Blood Pressure (Exercise) 108/64 112/52 134/60 100/60 120/54   Blood Pressure (Exit) 104/64 92/60 102/68 100/66 90/40   Heart Rate (Admit) 71 bpm 70 bpm 72 bpm 71 bpm 70 bpm   Heart Rate (Exercise) 78 bpm 79 bpm 79 bpm 80 bpm 84 bpm   Heart Rate (Exit) 72 bpm 59 bpm 71 bpm 72 bpm 78 bpm   Oxygen Saturation (Admit) 98 % 98 % 98 % 98 % 99 %   Oxygen Saturation (Exercise) 94 % 94 % 93 % 94 % 95 %   Oxygen Saturation (Exit) 97 % 97 % 97 % 98 % 98 %   Rating of Perceived Exertion (Exercise) _8 Perceived Dyspnea (Exercise) _9 Duration Progress to 45 minutes of aerobic exercise without signs/symptoms of physical distress Progress to 45 minutes of aerobic exercise without signs/symptoms of physical  distress Progress to 45 minutes of aerobic exercise without signs/symptoms of physical distress Progress to 45 minutes of aerobic exercise without signs/symptoms of physical distress Progress to 45 minutes of aerobic exercise without signs/symptoms of physical distress   Intensity _0      Progression   Progression Continue to progress workloads to maintain intensity without signs/symptoms of physical distress. Continue to progress workloads to maintain intensity without signs/symptoms of physical distress. Continue to progress workloads to maintain intensity without signs/symptoms of physical distress. Continue to progress workloads to maintain intensity without signs/symptoms of physical distress. Continue to progress workloads to maintain intensity without signs/symptoms of physical distress.     Resistance Training   Training Prescription _1    Weight _2    Reps 10-15 10-15 10-15 10-15 10-15   Time 10 Minutes 10 Minutes 10 Minutes 10 Minutes 10 Minutes     Interval Training   Interval Training _3      Bike   Level   -  - 0.5 0.5 0.8   Minutes  -  - _4 NuStep   Level _5 - 5   Minutes 34 17 17  - 17   METs 2.3 2.2 2.4  - 2.4     Stairs   Minutes _6 Row Name 06/01/17 1200 06/06/17 1200 06/08/17 1221         Response to Exercise   Blood Pressure (Admit) 114/50 104/56 124/70     Blood Pressure (Exercise) 126/60 110/54 140/60     Blood Pressure (Exit) 108/72 98/60 94/40     Heart Rate (Admit) 69 bpm 75 bpm 66 bpm     Heart Rate (Exercise) 76 bpm 84 bpm 83 bpm     Heart Rate (Exit) 72 bpm 74 bpm 74 bpm     Oxygen Saturation (Admit) 98 % 97 % 99 %     Oxygen Saturation (Exercise) 98 % 94 % 93 %     Oxygen Saturation (Exit) 96 % 97 % 98 %     Rating of Perceived Exertion (Exercise) _7 Perceived Dyspnea (Exercise) _8 Duration Progress to 45 minutes of aerobic exercise without signs/symptoms of physical distress Progress to 45 minutes of aerobic exercise without signs/symptoms of physical distress Progress to 45 minutes of aerobic exercise without signs/symptoms of physical distress     Intensity THRR unchanged THRR unchanged THRR unchanged       Progression   Progression Continue to progress workloads to maintain intensity without signs/symptoms of physical distress. Continue to progress workloads to maintain intensity without signs/symptoms of physical distress. Continue to progress workloads to maintain intensity without signs/symptoms of physical distress.       Resistance Training   Training Prescription Yes Yes Yes     Weight blue bands blue bands blue bands     Reps 10-15 10-15 10-15     Time 10 Minutes 10 Minutes 10 Minutes       Interval Training   Interval Training No No No       Bike   Level 0.8 0.8 1     Minutes _9 NuStep   Level 5 6  -     Minutes  17 17  -     METs 2.4 2.6  -       Stairs   Minutes  - 17 17        Exercise Comments:   Exercise Goals and Review:     Exercise Goals    Row Name 03/27/17 1034              Exercise Goals   Increase Physical Activity Yes       Intervention Provide advice, education, support and counseling about physical activity/exercise needs.;Develop an individualized exercise prescription for aerobic and resistive training based on initial evaluation findings, risk stratification, comorbidities and participant's personal goals.       Expected Outcomes Achievement of increased cardiorespiratory fitness and enhanced flexibility, muscular endurance and strength shown through measurements of functional capacity and personal statement of participant.       Increase Strength and Stamina Yes       Intervention Provide advice, education, support and counseling about physical activity/exercise needs.;Develop an individualized exercise prescription for aerobic and resistive training based on initial evaluation findings, risk stratification, comorbidities and participant's personal goals.       Expected Outcomes Achievement of increased cardiorespiratory fitness and enhanced flexibility, muscular endurance and strength shown through measurements of functional capacity and personal statement of participant.          Exercise Goals Re-Evaluation :     Exercise Goals Re-Evaluation    Row Name 05/08/17 1121 06/06/17 1312           Exercise Goal Re-Evaluation   Exercise Goals Review Increase Physical Activity;Increase Strenth and Stamina Increase Physical Activity;Increase Strenth and Stamina      Comments Patient is progressing well in rehab. He states that he already sees a difference in how he feels at home preforming ADL's. Attendance is consistent. Will cont. to monitor and progress as able.  Patient is progressing well in rehab. He is open and motivated to work harder when directed. He averages 2.4-2.6 METS. Stability and balance are an issue--that is why he is not walking on the track.       Expected Outcomes Through exercise at rehab and at home patient will be able to  increase physical activity, strength, and stamina. Through exercise at rehab and at home patient will be able to increase physical activity, strength, and stamina.         Discharge Exercise Prescription (Final Exercise Prescription Changes):     Exercise Prescription Changes - 06/08/17 1221      Response to Exercise   Blood Pressure (Admit) 124/70   Blood Pressure (Exercise) 140/60   Blood Pressure (Exit) 94/40   Heart Rate (Admit) 66 bpm   Heart Rate (Exercise) 83 bpm   Heart Rate (Exit) 74 bpm   Oxygen Saturation (Admit) 99 %   Oxygen Saturation (Exercise) 93 %   Oxygen Saturation (Exit) 98 %   Rating of Perceived Exertion (Exercise) 13   Perceived Dyspnea (Exercise) 1   Duration Progress to 45 minutes of aerobic exercise without signs/symptoms of physical distress   Intensity THRR unchanged     Progression   Progression Continue to progress workloads to maintain intensity without signs/symptoms of physical distress.     Resistance Training   Training Prescription Yes   Weight blue bands   Reps 10-15   Time 10 Minutes     Interval Training   Interval Training No     Bike   Level 1   Minutes 17  Stairs   Minutes 17      Nutrition:  Target Goals: Understanding of nutrition guidelines, daily intake of sodium <1526m, cholesterol <2063m calories 30% from fat and 7% or less from saturated fats, daily to have 5 or more servings of fruits and vegetables.  Biometrics:     Pre Biometrics - 03/27/17 1034      Pre Biometrics   Grip Strength 30 kg       Nutrition Therapy Plan and Nutrition Goals:     Nutrition Therapy & Goals - 05/09/17 1421      Nutrition Therapy   Diet Carb Modified     Personal Nutrition Goals   Nutrition Goal Wt loss of 1-2 lb/week to a wt loss goal of 6-24 lb at graduation from [facility] Improved blood glucose control as evidenced by pt's A1c trending toward 7 or less     Intervention Plan   Intervention  Prescribe, educate and counsel regarding individualized specific dietary modifications aiming towards targeted core components such as weight, hypertension, lipid management, diabetes, heart failure and other comorbidities.   Expected Outcomes Short Term Goal: Understand basic principles of dietary content, such as calories, fat, sodium, cholesterol and nutrients.;Long Term Goal: Adherence to prescribed nutrition plan.      Nutrition Discharge: Rate Your Plate Scores:     Nutrition Assessments - 05/09/17 1421      Rate Your Plate Scores   Pre Score 49      Nutrition Goals Re-Evaluation:   Nutrition Goals Discharge (Final Nutrition Goals Re-Evaluation):   Psychosocial: Target Goals: Acknowledge presence or absence of significant depression and/or stress, maximize coping skills, provide positive support system. Participant is able to verbalize types and ability to use techniques and skills needed for reducing stress and depression.  Initial Review & Psychosocial Screening:     Initial Psych Review & Screening - 03/27/17 1056      Initial Review   Current issues with None Identified     Family Dynamics   Good Support System? Yes     Barriers   Psychosocial barriers to participate in program There are no identifiable barriers or psychosocial needs.     Screening Interventions   Interventions Encouraged to exercise      Quality of Life Scores:   PHQ-9: Recent Review Flowsheet Data    [facility]   Decreased Interest 0   Down, Depressed, Hopeless 0   PHQ - 2 Score 0     Interpretation of Total Score  Total Score Depression Severity:  1-4 = Minimal depression, 5-9 = Mild depression, 10-14 = Moderate depression, 15-19 = Moderately severe depression, 20-27 = Severe depression   Psychosocial Evaluation and Intervention:     Psychosocial Evaluation - 05/09/17 0740      Psychosocial Evaluation & Interventions   Interventions Encouraged to  exercise with the program and follow exercise prescription   Comments no psychosocial barriers identified over the past 30 days   Expected Outcomes patient will remain free of psychosocial barriers to participation in pulmonary rehab   Continue Psychosocial Services  No Follow up required      Psychosocial Re-Evaluation:     Psychosocial Re-Evaluation    [place] 05/09/17 0741 06/05/17 1540           Psychosocial Re-Evaluation   Current issues with None Identified None Identified      Expected Outcomes  - patient will remain free of psychosocial barrriers  Interventions  - Encouraged to attend Pulmonary Rehabilitation for the exercise      Continue Psychosocial Services  No Follow up required No Follow up required         Psychosocial Discharge (Final Psychosocial Re-Evaluation):     Psychosocial Re-Evaluation - 06/05/17 1540      Psychosocial Re-Evaluation   Current issues with None Identified   Expected Outcomes patient will remain free of psychosocial barrriers    Interventions Encouraged to attend Pulmonary Rehabilitation for the exercise   Continue Psychosocial Services  No Follow up required      Education: Education Goals: Education classes will be provided on a weekly basis, covering required topics. Participant will state understanding/return demonstration of topics presented.  Learning Barriers/Preferences:     Learning Barriers/Preferences - 03/27/17 1055      Learning Barriers/Preferences   Learning Barriers None   Learning Preferences Computer/Internet;Group Instruction;Verbal Instruction;Written Material;Individual Instruction      Education Topics: Risk Factor Reduction:  -Group instruction that is supported by a PowerPoint presentation. Instructor discusses the definition of a risk factor, different risk factors for pulmonary disease, and how the heart and lungs work together.     Nutrition for Pulmonary Patient:  -Group instruction  provided by PowerPoint slides, verbal discussion, and written materials to support subject matter. The instructor gives an explanation and review of healthy diet recommendations, which includes a discussion on weight management, recommendations for fruit and vegetable consumption, as well as protein, fluid, caffeine, fiber, sodium, sugar, and alcohol. Tips for eating when patients are short of breath are discussed.   PULMONARY REHAB OTHER RESPIRATORY from 06/08/2017 in [place]  Date  05/25/17  Educator  RD  Instruction Review Code  2- meets goals/outcomes      Pursed Lip Breathing:  -Group instruction that is supported by demonstration and informational handouts. Instructor discusses the benefits of pursed lip and diaphragmatic breathing and detailed demonstration on how to preform both.     Oxygen Safety:  -Group instruction provided by PowerPoint, verbal discussion, and written material to support subject matter. There is an overview of "What is Oxygen" and "Why do we need it".  Instructor also reviews how to create a safe environment for oxygen use, the importance of using oxygen as prescribed, and the risks of noncompliance. There is a brief discussion on traveling with oxygen and resources the patient may utilize.   PULMONARY REHAB OTHER RESPIRATORY from 06/08/2017 in [place]  Date  05/18/17  Educator  Truddie Crumble  Instruction Review Code  2- meets goals/outcomes      Oxygen Equipment:  -Group instruction provided by [employer] Staff utilizing handouts, written materials, and equipment demonstrations.   PULMONARY REHAB OTHER RESPIRATORY from 06/08/2017 in [place]  Date  04/27/17  Educator  [employer]  Instruction Review Code  2- meets goals/outcomes      Signs and Symptoms:  -Group instruction provided by written material and verbal discussion to support subject matter. Warning signs  and symptoms of infection, stroke, and heart attack are reviewed and when to call the physician/911 reinforced. Tips for preventing the spread of infection discussed.   PULMONARY REHAB OTHER RESPIRATORY from 06/08/2017 in [place]  Date  04/06/17  Educator  rn  Instruction Review Code  2- meets goals/outcomes      Advanced Directives:  -Group instruction provided by verbal instruction and written material to support subject matter.  Instructor reviews Advanced Directive laws and proper instruction for filling out document.   Pulmonary Video:  -Group video education that reviews the importance of medication and oxygen compliance, exercise, good nutrition, pulmonary hygiene, and pursed lip and diaphragmatic breathing for the pulmonary patient.   Exercise for the Pulmonary Patient:  -Group instruction that is supported by a PowerPoint presentation. Instructor discusses benefits of exercise, core components of exercise, frequency, duration, and intensity of an exercise routine, importance of utilizing pulse oximetry during exercise, safety while exercising, and options of places to exercise outside of rehab.     PULMONARY REHAB OTHER RESPIRATORY from 06/08/2017 in [place]  Date  05/25/17  Educator  ep  Instruction Review Code  2- meets goals/outcomes      Pulmonary Medications:  -Verbally interactive group education provided by instructor with focus on inhaled medications and proper administration.   PULMONARY REHAB OTHER RESPIRATORY from 06/08/2017 in [place]  Date  04/20/17  Educator  Pharm  Instruction Review Code  2- meets goals/outcomes      Anatomy and Physiology of the Respiratory System and Intimacy:  -Group instruction provided by PowerPoint, verbal discussion, and written material to support subject matter. Instructor reviews respiratory cycle and anatomical components of the  respiratory system and their functions. Instructor also reviews differences in obstructive and restrictive respiratory diseases with examples of each. Intimacy, Sex, and Sexuality differences are reviewed with a discussion on how relationships can change when diagnosed with pulmonary disease. Common sexual concerns are reviewed.   PULMONARY REHAB OTHER RESPIRATORY from 06/08/2017 in [place]  Date  04/13/17  Educator  RN  Instruction Review Code  2- meets goals/outcomes      MD DAY -A group question and answer session with a medical doctor that allows participants to ask questions that relate to their pulmonary disease state.   PULMONARY REHAB OTHER RESPIRATORY from 06/08/2017 in [place]  Date  06/08/17  Educator  Nelda Marseille  Instruction Review Code  2- meets goals/outcomes      OTHER EDUCATION -Group or individual verbal, written, or video instructions that support the educational goals of the pulmonary rehab program.   Knowledge Questionnaire Score:     Knowledge Questionnaire Score - 04/12/17 1200      Knowledge Questionnaire Score   Pre Score 9/13      Core Components/Risk Factors/Patient Goals at Admission:     Personal Goals and Risk Factors at Admission - 05/09/17 1426      Core Components/Risk Factors/Patient Goals on Admission    Weight Management Obesity;Yes   Intervention Weight Management: Develop a combined nutrition and exercise program designed to reach desired caloric intake, while maintaining appropriate intake of nutrient and fiber, sodium and fats, and appropriate energy expenditure required for the weight goal.;Obesity: Provide education and appropriate resources to help participant work on and attain dietary goals.;Weight Management/Obesity: Establish reasonable short term and long term weight goals.;Weight Management: Provide education and appropriate resources to help participant work on and  attain dietary goals.   Admit Weight 261 lb (118.4 kg)   Goal Weight: Short Term 255 lb (115.7 kg)   Goal Weight: Long Term 240 lb (108.9 kg)   Expected Outcomes Short Term: Continue to assess and modify interventions until short term weight is achieved;Understanding of distribution of calorie intake throughout the day with the consumption of 4-5 meals/snacks;Understanding recommendations for meals to include 15-35% energy as  protein, 25-35% energy from fat, 35-60% energy from carbohydrates, less than '200mg'$  of dietary cholesterol, 20-35 gm of total fiber daily;Weight Loss: Understanding of general recommendations for a balanced deficit meal plan, which promotes 1-2 lb weight loss per week and includes a negative energy balance of [id number] kcal/d   Improve shortness of breath with ADL's Yes   Intervention Provide education, individualized exercise plan and daily activity instruction to help decrease symptoms of SOB with activities of daily living.   Expected Outcomes Short Term: Achieves a reduction of symptoms when performing activities of daily living.   Develop more efficient breathing techniques such as purse lipped breathing and diaphragmatic breathing; and practicing self-pacing with activity Yes   Intervention Provide education, demonstration and support about specific breathing techniuqes utilized for more efficient breathing. Include techniques such as pursed lipped breathing, diaphragmatic breathing and self-pacing activity.   Expected Outcomes Short Term: Participant will be able to demonstrate and use breathing techniques as needed throughout daily activities.   Diabetes Yes   Intervention Provide education about signs/symptoms and action to take for hypo/hyperglycemia.;Provide education about proper nutrition, including hydration, and aerobic/resistive exercise prescription along with prescribed medications to achieve blood glucose in normal ranges: Fasting glucose 65-99 mg/dL   Expected  Outcomes Short Term: Participant verbalizes understanding of the signs/symptoms and immediate care of hyper/hypoglycemia, proper foot care and importance of medication, aerobic/resistive exercise and nutrition plan for blood glucose control.;Long Term: Attainment of HbA1C < 7%.      Core Components/Risk Factors/Patient Goals Review:      Goals and Risk Factor Review    Row Name 05/09/17 0739 06/05/17 1533           Core Components/Risk Factors/Patient Goals Review   Personal Goals Review Weight Management/Obesity;Diabetes;Develop more efficient breathing techniques such as purse lipped breathing and diaphragmatic breathing and practicing self-pacing with activity.;Improve shortness of breath with ADL's Weight Management/Obesity;Diabetes;Develop more efficient breathing techniques such as purse lipped breathing and diaphragmatic breathing and practicing self-pacing with activity.;Improve shortness of breath with ADL's      Review see comment section on ITP Shane [name] is maintaining his slow weight loss trend. He was admitted with a max weight of 121.4 and his current weight is 118.6. 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 He self paces in order to maximixe the ammount of time he is able to exercise without restbreaks and verbalize a decrease in his shortness of breath with exertion.      Expected Outcomes see Admission expected outcomes see Admission expected outcomes         Core Components/Risk Factors/Patient Goals at Discharge (Final Review):      Goals and Risk Factor Review - 06/05/17 1533      Core Components/Risk Factors/Patient Goals Review   Personal Goals Review Weight Management/Obesity;Diabetes;Develop more efficient breathing techniques such as purse lipped breathing and diaphragmatic breathing and practicing self-pacing with activity.;Improve shortness of  breath with ADL's   Review Shane [name] is maintaining his slow weight loss trend. He was admitted with a max weight of 121.4 and his current weight is 118.6. 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 He self paces in order to maximixe the ammount of time he is able to exercise without restbreaks and verbalize a  decrease in his shortness of breath with exertion.   Expected Outcomes see Admission expected outcomes      ITP Comments:   Comments: ITP REVIEW Pt is making expected progress toward pulmonary rehab goals after completing 18 sessions. Recommend continued exercise, life style modification, education, and utilization of breathing techniques to increase stamina and strength and decrease shortness of breath with exertion.

## 2017-06-08 NOTE — Progress Notes (Signed)
Daily Session Note  Patient Details  Name: Shane [name] MRN: [id number] Date of Birth: [birth year] Referring Provider:     Pulmonary Rehab Walk Test from 03/30/2017 in [place]  Referring Provider  Dr. Chase Caller      Encounter Date: 06/08/2017  Check In:     Session Check In - 06/08/17 1016      Check-In   Location [facility]   Staff Present Rosebud Poles, RN, BSN;Molly diVincenzo, MS, ACSM RCEP, Exercise Physiologist;Lisa Ysidro Evert, RN;Sahvannah Rieser Rollene Rotunda, RN, BSN   Supervising physician immediately available to respond to emergencies [facility] immediately available   Physician(s) Dr. Wendee Beavers   Medication changes reported     No   Fall or balance concerns reported    No   Tobacco Cessation No Change   Warm-up and Cool-down Performed as group-led instruction   Resistance Training Performed Yes   VAD Patient? No     VAD patient   Has back up controller? No     Pain Assessment   Currently in Pain? No/denies   Multiple Pain Sites No      Capillary Blood Glucose: No results found for this or any previous visit (from the past 24 hour(s)).     POCT Glucose - 06/08/17 1226      POCT Blood Glucose   Pre-Exercise 205 mg/dL   Post-Exercise 149 mg/dL         Exercise Prescription Changes - 06/08/17 1221      Response to Exercise   Blood Pressure (Admit) 124/70   Blood Pressure (Exercise) 140/60   Blood Pressure (Exit) 94/40   Heart Rate (Admit) 66 bpm   Heart Rate (Exercise) 83 bpm   Heart Rate (Exit) 74 bpm   Oxygen Saturation (Admit) 99 %   Oxygen Saturation (Exercise) 93 %   Oxygen Saturation (Exit) 98 %   Rating of Perceived Exertion (Exercise) 13   Perceived Dyspnea (Exercise) 1   Duration Progress to 45 minutes of aerobic exercise without signs/symptoms of physical distress   Intensity THRR unchanged     Progression   Progression Continue to progress workloads to maintain intensity without signs/symptoms of  physical distress.     Resistance Training   Training Prescription Yes   Weight blue bands   Reps 10-15   Time 10 Minutes     Interval Training   Interval Training No     Bike   Level 1   Minutes 17     Stairs   Minutes 17      History  Smoking Status  . Never Smoker  Smokeless Tobacco  . Never Used    Goals Met:  Independence with exercise equipment Improved SOB with ADL's Using PLB without cueing & demonstrates good technique Exercise tolerated well No report of cardiac concerns or symptoms Strength training completed today  Goals Unmet:  Not Applicable  Comments: Service time is from 1030 to 1210   Dr. Rush Farmer is Medical Director for Pulmonary Rehab at [facility].

## 2017-06-09 ENCOUNTER — Other Ambulatory Visit (INDEPENDENT_AMBULATORY_CARE_PROVIDER_SITE_OTHER): Payer: Medicare Other

## 2017-06-09 DIAGNOSIS — J84112 Idiopathic pulmonary fibrosis: Secondary | ICD-10-CM | POA: Diagnosis not present

## 2017-06-09 LAB — HEPATIC FUNCTION PANEL
ALT: 191 U/L — ABNORMAL HIGH (ref 0–53)
AST: 92 U/L — ABNORMAL HIGH (ref 0–37)
Albumin: 3.7 g/dL (ref 3.5–5.2)
Alkaline Phosphatase: 87 U/L (ref 39–117)
Bilirubin, Direct: 0.1 mg/dL (ref 0.0–0.3)
Total Bilirubin: 0.6 mg/dL (ref 0.2–1.2)
Total Protein: 7.8 g/dL (ref 6.0–8.3)

## 2017-06-13 ENCOUNTER — Encounter (HOSPITAL_COMMUNITY)
Admission: RE | Admit: 2017-06-13 | Discharge: 2017-06-13 | Disposition: A | Discharge status: Home / Self Care | Payer: Medicare Other | Source: Ambulatory Visit | Attending: Internal Medicine | Admitting: Internal Medicine

## 2017-06-13 ENCOUNTER — Telehealth: Payer: Self-pay | Admitting: Internal Medicine

## 2017-06-13 ENCOUNTER — Encounter (HOSPITAL_COMMUNITY): Payer: Medicare Other

## 2017-06-13 DIAGNOSIS — J84112 Idiopathic pulmonary fibrosis: Secondary | ICD-10-CM

## 2017-06-13 NOTE — Telephone Encounter (Signed)
  LFTS are coming down now that he is off esbriet  Plan  - list esbreit as allergy - communicate to patient NO ESBRIET EVER  - check LFT in 1 week (I am back only July 23) and then in 2 weeks  - return to see TP in 2 weeks - if LFT normal then start Ofev  Dr. Brand Males, M.D., [facility].C.P Pulmonary and Critical Care Medicine Staff Physician [place] [facility] Pager: [phone], If no answer or between  15:00h - 7:00h: call [phone]  06/13/2017 6:20 PM

## 2017-06-13 NOTE — Progress Notes (Signed)
Daily Session Note  Patient Details  Name: AZAN MANERI MRN: [id number] Date of Birth: Oct 05, 1941 Referring Provider:     Pulmonary Rehab Walk Test from 03/30/2017 in [place]  Referring Provider  Dr. Chase Caller      Encounter Date: 06/13/2017  Check In:     Session Check In - 06/13/17 1022      Check-In   [facility]   Staff Present Rosebud Poles, RN, BSN;Lisa Ysidro Evert, RN;Portia Rollene Rotunda, RN, BSN   Supervising physician immediately available to respond to emergencies [facility] immediately available   Physician(s) Dr. Doyle Askew   Medication changes reported     No   Fall or balance concerns reported    No   Tobacco Cessation No Change   Warm-up and Cool-down Performed as group-led instruction   Resistance Training Performed Yes   VAD Patient? No     VAD patient   Has back up controller? No     Pain Assessment   Currently in Pain? No/denies   Multiple Pain Sites No      Capillary Blood Glucose: No results found for this or any previous visit (from the past 24 hour(s)).     POCT Glucose - 06/13/17 1237      POCT Blood Glucose   Pre-Exercise 190 mg/dL   Post-Exercise 117 mg/dL         Exercise Prescription Changes - 06/13/17 1200      Response to Exercise   Blood Pressure (Admit) 110/60   Blood Pressure (Exercise) 120/70   Blood Pressure (Exit) 106/54   Heart Rate (Admit) 55 bpm   Heart Rate (Exercise) 87 bpm   Heart Rate (Exit) 87 bpm   Oxygen Saturation (Admit) 99 %   Oxygen Saturation (Exercise) 92 %   Oxygen Saturation (Exit) 98 %   Rating of Perceived Exertion (Exercise) 11   Perceived Dyspnea (Exercise) 1   Duration Progress to 45 minutes of aerobic exercise without signs/symptoms of physical distress   Intensity THRR unchanged     Progression   Progression Continue to progress workloads to maintain intensity without signs/symptoms of physical distress.     Resistance Training   Training Prescription Yes   Weight blue bands   Reps 10-15   Time 10 Minutes     Interval Training   Interval Training No     Bike   Level 1   Minutes 17     NuStep   Level 6   Minutes 17   METs 2.7     Stairs   Minutes 17      History  Smoking Status  . Never Smoker  Smokeless Tobacco  . Never Used    Goals Met:  Exercise tolerated well Strength training completed today  Goals Unmet:  Not Applicable  Comments: Service time is from 1030 to 1215    Dr. Rush Farmer is Medical Director for Pulmonary Rehab at [facility].

## 2017-06-13 NOTE — Telephone Encounter (Signed)
Spoke with patient's wife. Advised her that the LFT and HST results were in Epic but MR has not had a chance to review them yet. She verbalized understanding.    MR, please advise on patient's results. Thanks.

## 2017-06-13 NOTE — Telephone Encounter (Signed)
Wife [name]) also requesting results from [place]...ert

## 2017-06-14 ENCOUNTER — Telehealth: Payer: Self-pay | Admitting: Internal Medicine

## 2017-06-14 NOTE — Telephone Encounter (Signed)
Nothing needed - see 7.10.18 phone note Will sign off

## 2017-06-14 NOTE — Telephone Encounter (Signed)
Spoke with patient's wife. She is aware about the results and Esbriet. Esbriet has been added to patient's allergy list. Lab orders for LFTs have been placed and she is aware of the time frame. Attempted to schedule pt with TP in 2 weeks but she will not be here on the 27th. Will ask Janett Billow if the patient can be squeezed in on either the 26th or 30th if possible.   Advised pt about Ofev. She wants to know if we can go ahead and get started on paperwork. She explained that there was a delay in getting the Esbriet due to paperwork and she does not want this to happen again. MR, is this ok for [place] to start early? Please advise  Janett Billow, is it ok for [place] to schedule pt in one of TP's held spots on the 26th or 30th? Please advise.

## 2017-06-14 NOTE — Telephone Encounter (Signed)
Patient can see Judson Roch groce too for followup or TP on 7/26 or 7/30  Ok to start ofev paper work - I can sign 06/14/2017 - I wil lbe in basement and you need to call me or texdt me to come and sign because I go on vacation 06/14/2017  - 7/22  However, they CANNOT start first dose till LFTs fully normalize because ofev also can increaes LFT. That is why I did not bring it up earlier  Dr. Brand Males, M.D., [facility].C.P Pulmonary and Critical Care Medicine Staff Physician [place] [facility] Pager: [phone], If no answer or between  15:00h - 7:00h: call [phone]  06/14/2017 2:48 PM

## 2017-06-14 NOTE — Telephone Encounter (Signed)
MR has an opening on 7.26.18 @ 1130 Pt can see TP in [place] on 7.26.18, she is not in the office on 7.30.18 Thanks   Called spoke with patient's spouse Shane [name] - appt offered with MR for 7.26.18 @ 1130.  Shane [name] okay with this date/time but there is a scheduling conflict with Pulmonary Rehab.  Shane [name] asked that we call Pulm Rehab to cancel this appt and reschedule for 7.31.18.  Advised will do so.  Discussed MR's recommendations with Shane [name] that patient CANNOT start Ofev until his liver function returns to normal but that MR okayed to start the paperwork since he is getting ready to be unavailable.  Shane [name] okay with this and voiced her understanding.    Paperwork initiated and taken to MR for signature.  Forms signed and given to [name] for follow up if/when pt's LFTs return to normal.  Spoke with [name] at [name], appt for 7.26.18 will be cancelled and a note to [name] to 7.31.18 left [name] unsure if pt's insurance allows a certain number of Pulm Rehab visits vs a certain length of time in [name]).  Anything further on this front will be handled by [name].  Nothing further needed at this time; will sign off.

## 2017-06-15 ENCOUNTER — Encounter (HOSPITAL_COMMUNITY): Payer: Medicare Other

## 2017-06-15 ENCOUNTER — Encounter (HOSPITAL_COMMUNITY)
Admission: RE | Admit: 2017-06-15 | Discharge: 2017-06-15 | Disposition: A | Discharge status: Home / Self Care | Payer: Medicare Other | Source: Ambulatory Visit | Attending: Internal Medicine | Admitting: Internal Medicine

## 2017-06-15 VITALS — Wt 261.7 lb

## 2017-06-15 DIAGNOSIS — J84112 Idiopathic pulmonary fibrosis: Secondary | ICD-10-CM | POA: Diagnosis not present

## 2017-06-15 NOTE — Progress Notes (Signed)
Daily Session Note  Patient Details  Name: Shane [name] MRN: [id number] Date of Birth: 04-Dec-1941 Referring Provider:     Pulmonary Rehab Walk Test from 03/30/2017 in [place]  Referring Provider  Dr. Chase Caller      Encounter Date: 06/15/2017  Check In:     Session Check In - 06/15/17 1025      Check-In   Location [facility]   Staff Present Rosebud Poles, RN, BSN;Lisa Ysidro Evert, RN;Portia Rollene Rotunda, RN, BSN   Supervising physician immediately available to respond to emergencies [facility] immediately available   Physician(s) Dr. Maryland Pink   Medication changes reported     No   Fall or balance concerns reported    No   Tobacco Cessation No Change   Warm-up and Cool-down Performed as group-led instruction   Resistance Training Performed Yes   VAD Patient? No     Pain Assessment   Currently in Pain? No/denies   Multiple Pain Sites No      Capillary Blood Glucose: No results found for this or any previous visit (from the past 24 hour(s)).     POCT Glucose - 06/15/17 1224      POCT Blood Glucose   Pre-Exercise 230 mg/dL   Post-Exercise 168 mg/dL         Exercise Prescription Changes - 06/15/17 1200      Response to Exercise   Blood Pressure (Admit) 104/54   Blood Pressure (Exercise) 120/70   Blood Pressure (Exit) 110/66   Heart Rate (Admit) 68 bpm   Heart Rate (Exercise) 78 bpm   Heart Rate (Exit) 69 bpm   Oxygen Saturation (Admit) 98 %   Oxygen Saturation (Exercise) 98 %   Oxygen Saturation (Exit) 98 %   Rating of Perceived Exertion (Exercise) 11   Perceived Dyspnea (Exercise) 0   Duration Continue with 45 min of aerobic exercise without signs/symptoms of physical distress.   Intensity THRR unchanged     Progression   Progression Continue to progress workloads to maintain intensity without signs/symptoms of physical distress.     Resistance Training   Training Prescription Yes   Weight blue bands   Reps 10-15   Time 10 Minutes     Interval Training   Interval Training No     Bike   Level 1   Minutes 17     NuStep   Level 6   Minutes 17   METs 2.4      History  Smoking Status  . Never Smoker  Smokeless Tobacco  . Never Used    Goals Met:  Exercise tolerated well No report of cardiac concerns or symptoms Strength training completed today  Goals Unmet:  Not Applicable  Comments: Service time is from 1030 to 1215    Dr. Rush Farmer is Medical Director for Pulmonary Rehab at [facility].

## 2017-06-16 ENCOUNTER — Telehealth: Payer: Self-pay | Admitting: Internal Medicine

## 2017-06-16 ENCOUNTER — Other Ambulatory Visit (INDEPENDENT_AMBULATORY_CARE_PROVIDER_SITE_OTHER): Payer: Medicare Other

## 2017-06-16 DIAGNOSIS — G4733 Obstructive sleep apnea (adult) (pediatric): Secondary | ICD-10-CM

## 2017-06-16 DIAGNOSIS — J84112 Idiopathic pulmonary fibrosis: Secondary | ICD-10-CM

## 2017-06-16 LAB — HEPATIC FUNCTION PANEL
ALT: 82 U/L — ABNORMAL HIGH (ref 0–53)
AST: 43 U/L — ABNORMAL HIGH (ref 0–37)
Albumin: 3.7 g/dL (ref 3.5–5.2)
Alkaline Phosphatase: 77 U/L (ref 39–117)
Bilirubin, Direct: 0.2 mg/dL (ref 0.0–0.3)
Total Bilirubin: 0.5 mg/dL (ref 0.2–1.2)
Total Protein: 7.6 g/dL (ref 6.0–8.3)

## 2017-06-16 NOTE — Telephone Encounter (Signed)
ATC pt's wife back, it lead to her employer was transferred to her phone line but she did not answer. Will need to try to call her back

## 2017-06-19 NOTE — Telephone Encounter (Signed)
Spoke with pt's wife. She is requesting the pt's HST results.  CY - please advise. Thanks.

## 2017-06-19 NOTE — Telephone Encounter (Signed)
Please tell operator she is expecting our call, Silas [place] [phone].

## 2017-06-19 NOTE — Telephone Encounter (Signed)
atc pt's wife, unavailable at this time.  Wcb.

## 2017-06-20 ENCOUNTER — Encounter (HOSPITAL_COMMUNITY)
Admission: RE | Admit: 2017-06-20 | Discharge: 2017-06-20 | Disposition: A | Discharge status: Home / Self Care | Payer: Medicare Other | Source: Ambulatory Visit | Attending: Internal Medicine | Admitting: Internal Medicine

## 2017-06-20 ENCOUNTER — Encounter (HOSPITAL_COMMUNITY): Payer: Medicare Other

## 2017-06-20 VITALS — Wt 263.0 lb

## 2017-06-20 DIAGNOSIS — J84112 Idiopathic pulmonary fibrosis: Secondary | ICD-10-CM | POA: Diagnosis not present

## 2017-06-20 NOTE — Progress Notes (Signed)
Daily Session Note  Patient Details  Name: SELIM DURDEN MRN: [id number] Date of Birth: 1941/08/09 Referring Provider:     Pulmonary Rehab Walk Test from 03/30/2017 in [place]  Referring Provider  Dr. Chase Caller      Encounter Date: 06/20/2017  Check In:     Session Check In - 06/20/17 1012      Check-In   Location [facility]   Staff Present Rosebud Poles, RN, BSN;Lisa Ysidro Evert, RN;Molly diVincenzo, MS, ACSM RCEP, Exercise Physiologist;Portia Rollene Rotunda, RN, BSN   Supervising physician immediately available to respond to emergencies [facility] immediately available   Physician(s) Dr. [place] Pink   Medication changes reported     No   Fall or balance concerns reported    No   Tobacco Cessation No Change   Warm-up and Cool-down Performed as group-led instruction   Resistance Training Performed Yes   VAD Patient? No     VAD patient   Has back up controller? No     Pain Assessment   Currently in Pain? No/denies   Multiple Pain Sites No      Capillary Blood Glucose: No results found for this or any previous visit (from the past 24 hour(s)).     POCT Glucose - 06/20/17 1207      POCT Blood Glucose   Pre-Exercise 161 mg/dL   Post-Exercise 102 mg/dL  ate peanut butter crackers post exercise         Exercise Prescription Changes - 06/20/17 1200      Response to Exercise   Blood Pressure (Admit) 120/52   Blood Pressure (Exercise) 130/68   Blood Pressure (Exit) 120/62   Heart Rate (Admit) 60 bpm   Heart Rate (Exercise) 75 bpm   Heart Rate (Exit) 67 bpm   Oxygen Saturation (Admit) 97 %   Oxygen Saturation (Exercise) 996 %   Oxygen Saturation (Exit) 98 %   Rating of Perceived Exertion (Exercise) 11   Perceived Dyspnea (Exercise) 0   Duration Continue with 45 min of aerobic exercise without signs/symptoms of physical distress.   Intensity THRR unchanged     Progression   Progression Continue to progress workloads to  maintain intensity without signs/symptoms of physical distress.     Resistance Training   Training Prescription Yes   Weight blue bands   Reps 10-15   Time 10 Minutes     Interval Training   Interval Training No     Bike   Level 1.2   Minutes 17     Stairs   Minutes 17     Track   Laps 11   Minutes 17      History  Smoking Status  . Never Smoker  Smokeless Tobacco  . Never Used    Goals Met:  Exercise tolerated well Strength training completed today  Goals Unmet:  Not Applicable  Comments: Service time is from 1030 to 1200    Dr. Rush Farmer is Medical Director for Pulmonary Rehab at [facility]

## 2017-06-21 ENCOUNTER — Telehealth: Payer: Self-pay | Admitting: Internal Medicine

## 2017-06-21 NOTE — Telephone Encounter (Signed)
Patient's wife is returning phone call.

## 2017-06-21 NOTE — Telephone Encounter (Signed)
Spoke with pt's wife, states that pt currently has a respironics machine but wants to know if something newer is available.  I advised that when we order a new machine for pts the newest model is typically dispensed.  Pt's wife expressed understanding.  Nothing further needed at this time.

## 2017-06-21 NOTE — Telephone Encounter (Signed)
Patient wife [name] returning call - she can be reached at [phone] - this is her work number - please let the operator that answers know that she is expecting our call and they will get her to the phone -pr

## 2017-06-21 NOTE — Telephone Encounter (Signed)
Spoke with pt's wife, aware of results/recs.  cpap ordered.  Pt already has rov on 10/3 with CY, will keep appt as this falls within the 31-90 day range.  Nothing further needed at this time.

## 2017-06-21 NOTE — Telephone Encounter (Signed)
Attempted to call the pt but the phone call would not go through. 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  Recommend order- new DME, new CPAP, auto 5-20, mask of choice, humidifier, supplies AirView  Dx OSA  Please scheddule him a return appointment in 3 months for follow-up after getting new machine.   I have called the pts wife at work and lmomtcb for her to call us back.

## 2017-06-22 ENCOUNTER — Encounter (HOSPITAL_COMMUNITY): Payer: Medicare Other

## 2017-06-22 ENCOUNTER — Encounter (HOSPITAL_COMMUNITY)
Admission: RE | Admit: 2017-06-22 | Discharge: 2017-06-22 | Disposition: A | Discharge status: Home / Self Care | Payer: Medicare Other | Source: Ambulatory Visit | Attending: Internal Medicine | Admitting: Internal Medicine

## 2017-06-22 VITALS — Wt 263.9 lb

## 2017-06-22 DIAGNOSIS — J84112 Idiopathic pulmonary fibrosis: Secondary | ICD-10-CM

## 2017-06-22 NOTE — Progress Notes (Signed)
Daily Session Note  Patient Details  Name: Shane [name] MRN: [id number] Date of Birth: Jan 30, 1941 Referring Provider:     Pulmonary Rehab Walk Test from 03/30/2017 in [place]  Referring Provider  Dr. Chase Caller      Encounter Date: 06/22/2017  Check In:     Session Check In - 06/22/17 1052      Check-In   Location [facility]   Staff Present Su Hilt, MS, ACSM RCEP, Exercise Physiologist;Joan Leonia Reeves, RN, BSN;Lisa Hughes, RN;Portia Rollene Rotunda, RN, BSN   Supervising physician immediately available to respond to emergencies [facility] immediately available   Physician(s) Dr. Burnis Medin   Medication changes reported     No   Fall or balance concerns reported    No   Tobacco Cessation No Change   Warm-up and Cool-down Performed as group-led instruction   Resistance Training Performed Yes   VAD Patient? No     VAD patient   Has back up controller? No     Pain Assessment   Currently in Pain? No/denies   Multiple Pain Sites No      Capillary Blood Glucose: No results found for this or any previous visit (from the past 24 hour(s)).     POCT Glucose - 06/22/17 1251      POCT Blood Glucose   Pre-Exercise 119 mg/dL   Post-Exercise 122 mg/dL         Exercise Prescription Changes - 06/22/17 1200      Response to Exercise   Blood Pressure (Admit) 114/54   Blood Pressure (Exercise) 144/70   Blood Pressure (Exit) 120/54   Heart Rate (Admit) 59 bpm   Heart Rate (Exercise) 79 bpm   Heart Rate (Exit) 67 bpm   Oxygen Saturation (Admit) 98 %   Oxygen Saturation (Exercise) 94 %   Oxygen Saturation (Exit) 99 %   Rating of Perceived Exertion (Exercise) 13   Perceived Dyspnea (Exercise) 1   Duration Continue with 45 min of aerobic exercise without signs/symptoms of physical distress.   Intensity THRR unchanged     Progression   Progression Continue to progress workloads to maintain intensity without signs/symptoms of  physical distress.     Resistance Training   Training Prescription Yes   Weight blue bands   Reps 10-15   Time 10 Minutes     Interval Training   Interval Training No     Stairs   Minutes 17     Track   Laps 13   Minutes 17      History  Smoking Status  . Never Smoker  Smokeless Tobacco  . Never Used    Goals Met:  Exercise tolerated well No report of cardiac concerns or symptoms Strength training completed today  Goals Unmet:  Not Applicable  Comments: Service time is from 10:30A to 12:30P    Dr. Rush Farmer is Medical Director for Pulmonary Rehab at [facility].

## 2017-06-26 ENCOUNTER — Telehealth: Payer: Self-pay | Admitting: Internal Medicine

## 2017-06-26 NOTE — Telephone Encounter (Signed)
LFt has improved off esbriet. So   A) Please put esbriet in allergy list B) no starting ofev - (ofev paper work done but can start only when LFT normal x few weeks) C) check lft again later this week to ensure is normal before ofev   Dr. Brand Males, M.D., [facility].C.P Pulmonary and Critical Care Medicine Staff Physician [facility] Pager: [phone], If no answer or between  15:00h - 7:00h: call [phone]  06/26/2017 2:12 PM

## 2017-06-27 ENCOUNTER — Encounter (HOSPITAL_COMMUNITY)
Admission: RE | Admit: 2017-06-27 | Discharge: 2017-06-27 | Disposition: A | Discharge status: Home / Self Care | Payer: Medicare Other | Source: Ambulatory Visit | Attending: Internal Medicine | Admitting: Internal Medicine

## 2017-06-27 ENCOUNTER — Encounter (HOSPITAL_COMMUNITY): Payer: Medicare Other

## 2017-06-27 VITALS — Wt 259.9 lb

## 2017-06-27 DIAGNOSIS — J84112 Idiopathic pulmonary fibrosis: Secondary | ICD-10-CM | POA: Diagnosis not present

## 2017-06-27 NOTE — Progress Notes (Signed)
Daily Session Note  Patient Details  Name: Shane [name] MRN: [id number] Date of Birth: 1941-07-25 Referring Provider:     Pulmonary Rehab Walk Test from 03/30/2017 in [place]  Referring Provider  Dr. Chase Caller      Encounter Date: 06/27/2017  Check In:     Session Check In - 06/27/17 1159      Check-In   Location [facility]      Capillary Blood Glucose: No results found for this or any previous visit (from the past 24 hour(s)).     POCT Glucose - 06/27/17 1200      POCT Blood Glucose   Pre-Exercise 197 mg/dL   Post-Exercise 94 mg/dL         Exercise Prescription Changes - 06/27/17 1100      Response to Exercise   Blood Pressure (Admit) 104/54   Blood Pressure (Exercise) 134/62   Blood Pressure (Exit) 110/62   Heart Rate (Admit) 70 bpm   Heart Rate (Exercise) 83 bpm   Heart Rate (Exit) 76 bpm   Oxygen Saturation (Admit) 97 %   Oxygen Saturation (Exercise) 93 %   Oxygen Saturation (Exit) 94 %   Rating of Perceived Exertion (Exercise) 13   Perceived Dyspnea (Exercise) 1   Duration Continue with 45 min of aerobic exercise without signs/symptoms of physical distress.   Intensity THRR unchanged     Progression   Progression Continue to progress workloads to maintain intensity without signs/symptoms of physical distress.     Resistance Training   Training Prescription Yes   Weight blue bands   Reps 10-15   Time 10 Minutes     Interval Training   Interval Training No     Bike   Level 1.2   Minutes 17     Stairs   Minutes 17     Track   Laps 10   Minutes 17      History  Smoking Status  . Never Smoker  Smokeless Tobacco  . Never Used    Goals Met:  Exercise tolerated well No report of cardiac concerns or symptoms Strength training completed today  Goals Unmet:  Not Applicable  Comments: Service time is from 10:30a to 12:00p    Dr. Rush Farmer is Medical Director for Pulmonary  Rehab at [facility].

## 2017-06-29 ENCOUNTER — Other Ambulatory Visit (INDEPENDENT_AMBULATORY_CARE_PROVIDER_SITE_OTHER): Payer: Medicare Other

## 2017-06-29 ENCOUNTER — Encounter: Payer: Self-pay | Admitting: Internal Medicine

## 2017-06-29 ENCOUNTER — Encounter (HOSPITAL_COMMUNITY): Payer: Medicare Other

## 2017-06-29 ENCOUNTER — Ambulatory Visit (INDEPENDENT_AMBULATORY_CARE_PROVIDER_SITE_OTHER): Payer: Medicare Other | Admitting: Internal Medicine

## 2017-06-29 VITALS — BP 142/80 | HR 74 | Ht 71.0 in | Wt 260.0 lb

## 2017-06-29 DIAGNOSIS — Z5181 Encounter for therapeutic drug level monitoring: Secondary | ICD-10-CM | POA: Insufficient documentation

## 2017-06-29 DIAGNOSIS — J84112 Idiopathic pulmonary fibrosis: Secondary | ICD-10-CM | POA: Diagnosis not present

## 2017-06-29 LAB — HEPATIC FUNCTION PANEL
ALT: 35 U/L (ref 0–53)
AST: 28 U/L (ref 0–37)
Albumin: 3.6 g/dL (ref 3.5–5.2)
Alkaline Phosphatase: 76 U/L (ref 39–117)
Bilirubin, Direct: 0.1 mg/dL (ref 0.0–0.3)
Total Bilirubin: 0.7 mg/dL (ref 0.2–1.2)
Total Protein: 7.9 g/dL (ref 6.0–8.3)

## 2017-06-29 NOTE — Progress Notes (Signed)
Subjective:     Patient ID: Shane [name], male   DOB: 10-Dec-1940, 76 y.o.   MRN: [id number]  HPI   PCP Shane [name]  HPI   IOV 10/12/2016  Chief Complaint  Patient presents with  . Pulomary Consult    Pt referred by Dr. Reynaldo [name] for DOE x months. pt states the SOB has worsened in the last couple weeks. Pt c/o dry cough. pt deneis f/c/s.     76 year old male referred for dyspnea. Reports that he is obese on CPAP on RA for years. 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 He has new onset dyspnea and cough. Details in [facility]P ILD questionnaire below. CXR recently personally visualized shows ILD changes. Reports that pneumonia and lasix Rx by PCP have not helped. Review of old CT chest 12/05/2008 shows bibasal ILD without honeuycombing on non HRCT chest. Possibly datin back to 2007 CT abd lung cut. Current findings are worse.   Symptos:  Dyspnea is new x 4-5 months gets dyspneic hurrying on level ground or stops at 100 yerds. Cough x 3 weeks with cough at night and is dry and can be episodic with severe attacks.   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 Did get xposed to insecticide, fertiiler, talc pain, cement, tile    OV  11/23/2016  Chief Complaint  Patient presents with  . Follow-up    Pt here after PFT, HRCT, RHC. Pt state his breathing has slightly improved. Pt c/o increase in dry cough. Pt deneis CP/tightness and f/c/s.    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 He and his wife stated that now he is getting progressively more  short of breath. Although he does tell me that when he tries to work on his car and he tries to push himself he does start feeling better but he feels that he is deconditioned but is unable to condition himself more in order to feel less short of breath. Wife though feels that his dyspnea is more progressive.  In terms of disease etiology:   - He had high resolution CT chest 10/08/2016 and the CT findings are not consistent with UIP. And that is chronicity to this disease with presence even 7 years ago. This mild progression only since then. He had autoimmune profile which is essentially negative.  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 He will only walk 2 laps and stopped because of shortness of breath. But his pulse ox did not drop below 95%. Resting pulse ox was 99%. Peak heart rate was 102. In essence that was clinically insignificant desaturation   - Results for Shane [name] (MRN [id number]) as of 11/23/2016 12:02  Ref. Range 10/14/2016  08:19  FVC-Pre Latest Units: L 3.00  FVC-%Pred-Pre Latest Units: % 63   Results for Shane [name] (MRN [id number]) as of 11/23/2016 12:02  Ref. Range 10/14/2016 08:19  TLC Latest Units: L 4.62  TLC % pred Latest Units: % 60   Results for Shane [name] (MRN [id number]) as of 11/23/2016 12:02  Ref. Range 10/14/2016 08:19  DLCO unc Latest Units: ml/min/mmHg 16.82  DLCO unc % pred Latest Units: % 46     In terms of operative risk for surgical lung biopsy   - His right heart cath shows normal mean pulmonary artery pressure 20 mm with slight elevation pulmonary artery systolic pressure to 34 mmHg and a normal wedge pressure.    - His Myoview is  reported normal in November 2017    - He does have sleep apnea and uses CPA   - He does have gait instability issues because of his chronic spine disease but this is stable    OV 01/05/2017  Chief Complaint  Patient presents with  . Follow-up    Pt here after biopsy. Pt states his breathing is unchanged since last seen in 11/2016. Pt c/o dry cough. Pt denies CP/tightness and f/c/s.     llung bx discussion   - here wife wife. 12/26/16 - UIP by Shane [name]. No new diagnosis    OV 02/20/2017  Chief Complaint  Patient presents with  . Follow-up    Pt states his breathing is unchanged since last OV. Pt still has not recieved the Shane [name] because of financial issues and unable to locate a foundation to help. Pt c/o dry cough. Pt denies CP/tightness.     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  He is here to follow-up how he was doing on Shane [name] (Shane [name]). However he had delays getting charity support. Helpful foundation ran out of money versus from his income level too high to provide support. He wants to plan of the [place]. Overall he feels stable in terms of cough and dyspnea. He is interested in pulmonary rehabilitation and he has been in touch with Shane [name] the chair of the local pulmonary fibrosis foundation support group and plans to attend the meeting March 29. There are no other new issues     OV 05/05/2017  Chief Complaint  Patient presents with  . Follow-up    pt states he is at baseline, some sob with exertion.     FU IPF; diagnosed based on biopsy Jan 2018.   Now on Shane [name] x 2 months. Good tolerance. Only mild fatigue. No GI side efects. Feels better. Seems to think Shane [name] improves situation; re-educated it does not.  Not using sun screen ; did not remember despite prior education. Wants referral for sleep doc.    OV 06/29/2017  Chief Complaint  Patient presents with  . Follow-up    Pt states his breathing is doing well. Pt  states he has an occassional dry cough. Pt denies CP/tightness and f/c/s.     Follow-up idiopathic pulmonary fibrosis diagnosis gender 2008 biopsy based  Last visit June 2018 at that time he was on Shane [name] [facility]) for 2 months. After that he had routine liver function tests and started having increased liver enzymes. We at first stopped the statins but this did not improve the liver function test. In fact it got worse. Finally we stopped Shane [name] (Shane [name]) and his liver function tests as of today has normalized. He did report significant fatigue while on  Shane [name] (Shane [name]). Now that he's office. The fatigue is resolved. He is doing pulmonary rehabilitation and this is helping his physical conditioning significantly and he feels good. Overall he feels is IPF is stable. He is here with his wife and they both extremely interested in further antibiotic therapy particularly Ofev. WE discussed   Both drugs OFEV and Shane [name] only slow down progression, 1 out of 6 patients  - this means extension in quality of life but no difference in symptoms  - no study directly compares the 2 drugs but efficacy roughly equal at 1 year time point   - OFEV  - - time to first exacerbation possibly reduced in one trial but not in another - twice daily, no titration, potentially more convenient dosing  - no need for sunscreen  - high chance of mild diarrhea but low chance of significant diarrhea needing to stop medication.   - Rx diarrhea with lomotil - slight increase in heart attack risk and theoretical increase in bleeding risk,   - need monthly blood work for 3 months and then every 6 months - monitor liver function     Recent Labs Lab 06/29/17 1047  AST 28  ALT 35  ALKPHOS 76  BILITOT 0.7  PROT 7.9  ALBUMIN 3.6   Results for CONSTANCE, HACKENBERG (MRN [id number]) as of 06/29/2017 12:25  Ref. Range 11/07/2016 10:44 12/19/2016 13:25 12/19/2016 13:26 12/27/2016 05:30 12/28/2016 05:30 05/05/2017 09:41  05/25/2017 12:42 06/02/2017 09:09 06/09/2017 11:14 06/16/2017 15:53 06/29/2017 10:47  AST Latest Ref Range: 0 - 37 U/L   38  20 74 (H) 88 (H) 107 (H) 92 (H) 43 (H) 28  ALT Latest Ref Range: 0 - 53 U/L   42  19 175 (H) 169 (H) 220 (H) 191 (H) 82 (H) 35       Results for AEDAN, GEIMER (MRN [id number]) as of 06/29/2017 12:25  Ref. Range 10/14/2016 08:19 04/06/2017 10:13  FVC-Pre Latest Units: L 3.00 2.94  FVC-%Pred-Pre Latest Units: % 63 62   Results for JOHNRYAN, SAO (MRN [id number]) as of 06/29/2017 12:25  Ref. Range 10/14/2016 08:19 04/06/2017 10:13  DLCO unc Latest Units: ml/min/mmHg 16.82 16.43  DLCO unc % pred Latest Units: % 46 45       has a past medical history of Cancer ([place]); Cataract; Chest pain; Chronic kidney disease; Diabetes mellitus; Dyspnea; GERD (gastroesophageal reflux disease); Hyperlipidemia; Hypertension; Neuromuscular disorder ([place]); PONV (postoperative nausea and vomiting); and Sleep apnea.   reports that he has never smoked. He has never used smokeless tobacco.  Past Surgical History:  Procedure Laterality Date  . ANTERIOR FUSION CERVICAL SPINE  2012  . CARDIAC CATHETERIZATION  2011  . CARDIAC CATHETERIZATION N/A 11/09/2016   Procedure: Right Heart Cath;  Surgeon: Belva Crome, [name];  Location: [facility] [facility];  Service: Cardiovascular;  Laterality: N/A;  . carpel tunnel     left wrist  . CATARACT EXTRACTION W/ INTRAOCULAR LENS  IMPLANT, BILATERAL  2013  . CERVICAL LAMINECTOMY  2012  . COLONOSCOPY N/A 01/14/2013   Procedure: COLONOSCOPY;  Surgeon: Irene Shipper, [name];  Location: [facility];  Service: Endoscopy;  Laterality: N/A;  . EYE SURGERY    . [place]   left  . LUMBAR LAMINECTOMY  2003  . LUNG BIOPSY Left 12/26/2016   Procedure: LUNG BIOPSY;  Surgeon: [name], [name];  Location: [facility];  Service: Thoracic;  Laterality: Left;  . POSTERIOR FUSION CERVICAL SPINE  2012  .  TRIGGER FINGER RELEASE  2011   4th finger left hand  . VIDEO ASSISTED  THORACOSCOPY Left 12/26/2016   Procedure: VIDEO ASSISTED THORACOSCOPY;  Surgeon: [name], [name];  Location: [facility];  Service: Thoracic;  Laterality: Left;  Marland Kitchen VIDEO BRONCHOSCOPY N/A 12/26/2016   Procedure: VIDEO BRONCHOSCOPY;  Surgeon: [name], [name];  Location: [facility];  Service: Thoracic;  Laterality: N/A;    Allergies  Allergen Reactions  . Codeine Hives and Itching  . Other Other (See Comments)    Shane [name] causes elevated LFTs. D/C on 06/14/17    Immunization History  Administered Date(s) Administered  . Influenza, High Dose Seasonal PF 08/05/2016  . Pneumococcal-Unspecified 12/05/2014    Family History  Problem Relation Age of Onset  . Diabetes Mellitus II Mother   . Emphysema Father 84  . Colon cancer Neg Hx   . Esophageal cancer Neg Hx   . Rectal cancer Neg Hx   . Stomach cancer Neg Hx      Current Outpatient Prescriptions:  .  Ascorbic Acid (VITAMIN C) 1000 MG tablet, Take 1,000 mg by mouth daily., Disp: , Rfl:  .  aspirin 81 MG tablet, Take 81 mg by mouth daily.  , Disp: , Rfl:  .  clotrimazole-betamethasone (LOTRISONE) cream, APPLY TO AFFECTED AREA TWICE A DAY, Disp: , Rfl: 0 .  diazepam (VALIUM) 5 MG tablet, Take 5 mg by mouth at bedtime as needed (for sleep). , Disp: , Rfl:  .  furosemide (LASIX) 40 MG tablet, Take 40 mg by mouth daily. , Disp: , Rfl:  .  insulin detemir (LEVEMIR) 100 UNIT/ML injection, Inject 22 Units into the skin at bedtime., Disp: , Rfl:  .  insulin lispro (HUMALOG KWIKPEN) 100 UNIT/ML injection, Inject 20 Units into the skin 3 (three) times daily before meals. Sliding scale, Disp: , Rfl:  .  Insulin Pump Disposable (V-GO 40) KIT, 40 Units by Does not apply route daily. humalog 100 units dispensed every 24 hours. Dispose v-go bag after 24 hours of use. , Disp: , Rfl:  .  KLOR-CON M20 20 MEQ tablet, Take 20 mEq by mouth daily. , Disp: , Rfl:  .  lisinopril (PRINIVIL,ZESTRIL) 20 MG tablet, Take 20 mg by mouth daily. , Disp: , Rfl:  .   Multiple Vitamin (MULTIVITAMIN) tablet, Take 1 tablet by mouth daily.  , Disp: , Rfl:  .  nebivolol (BYSTOLIC) 5 MG tablet, Take 5 mg by mouth daily., Disp: , Rfl:  .  nitroGLYCERIN (NITROSTAT) 0.4 MG SL tablet, Place 0.4 mg under the tongue every 5 (five) minutes as needed. For chest pain, Disp: , Rfl:  .  pantoprazole (PROTONIX) 40 MG tablet, Take 40 mg by mouth daily.  , Disp: , Rfl:  .  PROAIR HFA 108 (90 Base) MCG/ACT inhaler, Inhale 2 puffs into the lungs every 4 (four) hours as needed for wheezing or shortness of breath. , Disp: , Rfl:  .  promethazine-phenylephrine (PROMETHAZINE VC PLAIN) 6.25-5 MG/5ML SYRP, Take 5 mLs by mouth every 4 (four) hours as needed for congestion., Disp: , Rfl:    Review of Systems     Objective:   Physical Exam  Constitutional: He is oriented to person, place, and time. He appears well-developed and well-nourished. No distress.  HENT:  Head: Normocephalic and atraumatic.  Right Ear: External ear normal.  Left Ear: External ear normal.  Mouth/Throat: Oropharynx is clear and moist. No oropharyngeal exudate.  Obese mallampatti class 3  Eyes: Pupils are equal,  round, and reactive to light. Conjunctivae and EOM are normal. Right eye exhibits no discharge. Left eye exhibits no discharge. No scleral icterus.  Neck: Normal range of motion. Neck supple. No JVD present. No tracheal deviation present. No thyromegaly present.  Cardiovascular: Normal rate, regular rhythm and intact distal pulses.  Exam reveals no gallop and no friction rub.   No murmur heard. Pulmonary/Chest: Effort normal. No respiratory distress. He has no wheezes. He has rales. He exhibits no tenderness.  Abdominal: Soft. Bowel sounds are normal. He exhibits no distension and no mass. There is no tenderness. There is no rebound and no guarding.  Musculoskeletal: Normal range of motion. He exhibits no edema or tenderness.  Has cane  Lymphadenopathy:    He has no cervical adenopathy.   Neurological: He is alert and oriented to person, place, and time. He has normal reflexes. No cranial nerve deficit. Coordination normal.  Skin: Skin is warm and dry. No rash noted. He is not diaphoretic. No erythema. No pallor.  Psychiatric: He has a normal mood and affect. His behavior is normal. Judgment and thought content normal.  Nursing note and vitals reviewed.   Vitals:   06/29/17 1129  BP: (!) 142/80  Pulse: 74  SpO2: 94%  Weight: 260 lb (117.9 kg)  Height: 5' 11"  (1.803 m)    Estimated body mass index is 36.26 kg/m as calculated from the following:   Height as of this encounter: 5' 11"  (1.803 m).   Weight as of this encounter: 260 lb (117.9 kg).      Assessment:       ICD-10-CM   1. 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[place]) J84.112   2. Encounter for therapeutic drug monitoring Z51.81        Plan:      IPF is clinically stable Too bad you had intolerance with liver toxicity with Shane [name] (Shane [name]) Your liver function tests is normalized as of today We discussed Ofev and we will start this 06/29/2017  Appreciate your continued support and participation pulmonary fibrosis foundation Continue pulmonary rehabilitation  Follow up - Liver function tests in 6 weeks while on ofev - In 3 months do Pre-bd spiro and dlco only. No lung volume or bd response. No post-bd spiro - Return to see me in 3 months after above pulmonary function test   > 50% of this > 25 min visit spent in face to face counseling or coordination of care    Dr. Brand Males, M.D., [facility].C.P Pulmonary and Critical Care Medicine Staff Physician [place] [facility] Pager: [phone], If no answer or between  15:00h - 7:00h: call [phone]  06/29/2017 12:34 PM

## 2017-06-29 NOTE — Patient Instructions (Signed)
ICD-10-CM   1. 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 Encounter for therapeutic drug monitoring Z51.81    IPF is clinically stable Too bad you had intolerance with liver toxicity with Pirfenidone ([place]) Your liver function tests is normalized as of today We discussed Ofev and we will start this 06/29/2017  Appreciate your continued support and participation pulmonary fibrosis foundation Continue pulmonary rehabilitation  Follow up - Liver function tests in 6 weeks while on ofev - In 3 months do Pre-bd spiro and dlco only. No lung volume or bd response. No post-bd spiro - Return to see me in 3 months after above pulmonary function test

## 2017-06-29 NOTE — Telephone Encounter (Signed)
Pt seen at [facility] today 06/29/17. Will sign off.

## 2017-07-03 ENCOUNTER — Telehealth: Payer: Self-pay | Admitting: Internal Medicine

## 2017-07-03 NOTE — Telephone Encounter (Signed)
Order for CPAP was sent to [facility]. I called [facility] and spoke with Heather. She states that they did receive this order and they are waiting to get authorization from their insurance. I called and spoke with the pt's wife. She is aware of this information. Nothing further was needed at this time.

## 2017-07-04 ENCOUNTER — Encounter (HOSPITAL_COMMUNITY): Payer: Medicare Other

## 2017-07-04 ENCOUNTER — Encounter (HOSPITAL_COMMUNITY)
Admission: RE | Admit: 2017-07-04 | Discharge: 2017-07-04 | Disposition: A | Discharge status: Home / Self Care | Payer: Medicare Other | Source: Ambulatory Visit | Attending: Internal Medicine | Admitting: Internal Medicine

## 2017-07-04 DIAGNOSIS — J84112 Idiopathic pulmonary fibrosis: Secondary | ICD-10-CM

## 2017-07-04 NOTE — Progress Notes (Signed)
Daily Session Note  Patient Details  Name: Shane [name] MRN: [id number] Date of Birth: 18-Feb-1941 Referring Provider:     Pulmonary Rehab Walk Test from 03/30/2017 in [facility]  Referring Provider  Dr. Chase Caller      Encounter Date: 07/04/2017  Check In:     Session Check In - 07/04/17 1022      Check-In   Location [facility]   Staff Present Rodney Langton, RN;Molly diVincenzo, MS, ACSM RCEP, Exercise Physiologist   Supervising physician immediately available to respond to emergencies [facility] immediately available   Physician(s) Dr. Tobin Chad   Medication changes reported     No   Fall or balance concerns reported    No   Tobacco Cessation No Change   Warm-up and Cool-down Performed as group-led instruction   Resistance Training Performed Yes   VAD Patient? No     VAD patient   Has back up controller? No     Pain Assessment   Currently in Pain? Other (Comment)   Multiple Pain Sites No      Capillary Blood Glucose: No results found for this or any previous visit (from the past 24 hour(s)).     POCT Glucose - 07/04/17 1300      POCT Blood Glucose   Pre-Exercise 150 mg/dL   Post-Exercise 123 mg/dL         Exercise Prescription Changes - 07/04/17 1300      Response to Exercise   Blood Pressure (Admit) 100/50   Blood Pressure (Exercise) 124/62   Blood Pressure (Exit) 108/72   Heart Rate (Admit) 71 bpm   Heart Rate (Exercise) 87 bpm   Heart Rate (Exit) 76 bpm   Oxygen Saturation (Admit) 99 %   Oxygen Saturation (Exercise) 92 %   Oxygen Saturation (Exit) 97 %   Rating of Perceived Exertion (Exercise) 13   Perceived Dyspnea (Exercise) 1   Duration Continue with 45 min of aerobic exercise without signs/symptoms of physical distress.   Intensity THRR unchanged     Progression   Progression Continue to progress workloads to maintain intensity without signs/symptoms of physical distress.     Resistance  Training   Training Prescription Yes   Weight blue bands   Reps 10-15   Time 10 Minutes     Interval Training   Interval Training No     Bike   Level 1.2   Minutes 17     Stairs   Minutes 17     Track   Laps 11   Minutes 17      History  Smoking Status  . Never Smoker  Smokeless Tobacco  . Never Used    Goals Met:  Exercise tolerated well No report of cardiac concerns or symptoms Strength training completed today  Goals Unmet:  Not Applicable  Comments: Service time is from 1030 to 1200    Dr. Rush Farmer is Medical Director for Pulmonary Rehab at [facility].

## 2017-07-04 NOTE — Progress Notes (Signed)
Pulmonary Individual Treatment Plan  Patient Details  Name: Shane [name] MRN: [id number] Date of Birth: 02/17/1941 Referring Provider:     Pulmonary Rehab Walk Test from 03/30/2017 in [facility]  Referring Provider  Dr. Chase Caller      Initial Encounter Date:    Pulmonary Rehab Walk Test from 03/30/2017 in [facility]  Date  03/31/17  Referring Provider  Dr. Chase Caller      Visit Diagnosis: 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[place])  Patient's Home Medications on Admission:   Current Outpatient Prescriptions:  .  Ascorbic Acid (VITAMIN C) 1000 MG tablet, Take 1,000 mg by mouth daily., Disp: , Rfl:  .  aspirin 81 MG tablet, Take 81 mg by mouth daily.  , Disp: , Rfl:  .  clotrimazole-betamethasone (LOTRISONE) cream, APPLY TO AFFECTED AREA TWICE A DAY, Disp: , Rfl: 0 .  diazepam (VALIUM) 5 MG tablet, Take 5 mg by mouth at bedtime as needed (for sleep). , Disp: , Rfl:  .  furosemide (LASIX) 40 MG tablet, Take 40 mg by mouth daily. , Disp: , Rfl:  .  insulin detemir (LEVEMIR) 100 UNIT/ML injection, Inject 22 Units into the skin at bedtime., Disp: , Rfl:  .  insulin lispro (HUMALOG KWIKPEN) 100 UNIT/ML injection, Inject 20 Units into the skin 3 (three) times daily before meals. Sliding scale, Disp: , Rfl:  .  Insulin Pump Disposable (V-GO 40) KIT, 40 Units by Does not apply route daily. humalog 100 units dispensed every 24 hours. Dispose v-go bag after 24 hours of use. , Disp: , Rfl:  .  KLOR-CON M20 20 MEQ tablet, Take 20 mEq by mouth daily. , Disp: , Rfl:  .  lisinopril (PRINIVIL,ZESTRIL) 20 MG tablet, Take 20 mg by mouth daily. , Disp: , Rfl:  .  Multiple Vitamin (MULTIVITAMIN) tablet, Take 1 tablet by mouth daily.  , Disp: , Rfl:  .  nebivolol (BYSTOLIC) 5 MG tablet, Take 5 mg by mouth daily., Disp: , Rfl:  .  nitroGLYCERIN (NITROSTAT) 0.4 MG SL tablet, Place 0.4 mg under the tongue every 5 (five) minutes as needed. For chest  pain, Disp: , Rfl:  .  pantoprazole (PROTONIX) 40 MG tablet, Take 40 mg by mouth daily.  , Disp: , Rfl:  .  PROAIR HFA 108 (90 Base) MCG/ACT inhaler, Inhale 2 puffs into the lungs every 4 (four) hours as needed for wheezing or shortness of breath. , Disp: , Rfl:  .  promethazine-phenylephrine (PROMETHAZINE VC PLAIN) 6.25-5 MG/5ML SYRP, Take 5 mLs by mouth every 4 (four) hours as needed for congestion., Disp: , Rfl:   Past Medical History: Past Medical History:  Diagnosis Date  . 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 Cataract   . Chest pain   . Chronic kidney disease    d/t DM  . Diabetes mellitus    Vgo disposal insulin bolus  simular to insulin pump  . Dyspnea   . GERD (gastroesophageal reflux disease)   . Hyperlipidemia   . Hypertension   . Neuromuscular disorder ([place])   . PONV (postoperative nausea and vomiting)   . Sleep apnea     uses cpap asked to bring mask and tubing    Tobacco Use: History  Smoking Status  . Never Smoker  Smokeless Tobacco  . Never Used    Labs: Recent Review Flowsheet Data    Labs for ITP Cardiac and Pulmonary Rehab Latest Ref Rng & Units 11/09/2016  11/09/2016 12/19/2016 12/26/2016 12/27/2016   Cholestrol 0 - 200 mg/dL - - - - -   LDLCALC 0 - 99 mg/dL - - - - -   HDL >39 mg/dL - - - - -   Trlycerides <150 mg/dL - - - - -   Hemoglobin A1c 4.8 - 5.6 % - - 7.9(H) - -   PHART 7.350 - 7.450 - - TEST WILL BE CREDITED 7.376 7.362   PCO2ART 32.0 - 48.0 mmHg - - TEST WILL BE CREDITED 44.0 45.4   HCO3 20.0 - 28.0 mmol/L 28.0 30.0(H) TEST WILL BE CREDITED 25.2 25.1   TCO2 0 - 100 mmol/L 29 31 - - -   ACIDBASEDEF 0.0 - 2.0 mmol/L - - TEST WILL BE CREDITED - -   O2SAT % 59.0 61.0 TEST WILL BE CREDITED 93.9 95.7      Capillary Blood Glucose: Lab Results  Component Value Date   GLUCAP 191 (H) 12/29/2016   GLUCAP 243 (H) 12/28/2016   GLUCAP 233 (H) 12/28/2016   GLUCAP 238 (H) 12/28/2016   GLUCAP 270 (H) 12/28/2016       POCT Glucose    Row Name 04/11/17 1237  04/25/17 1210 05/02/17 1215 05/04/17 1237 05/11/17 1222     POCT Blood Glucose   Pre-Exercise 163 mg/dL 207 mg/dL 185 mg/dL 212 mg/dL 130 mg/dL   Post-Exercise 155 mg/dL 112 mg/dL 132 mg/dL 151 mg/dL 107 mg/dL   Row Name 05/16/17 1203 05/18/17 1233 05/23/17 1214 05/25/17 1239 05/30/17 1217     POCT Blood Glucose   Pre-Exercise 170 mg/dL 127 mg/dL 166 mg/dL 170 mg/dL 159 mg/dL   Post-Exercise 119 mg/dL 128 mg/dL 102 mg/dL 127 mg/dL 127 mg/dL   Row Name 06/01/17 1214 06/08/17 1226 06/13/17 1237 06/15/17 1224 06/20/17 1207     POCT Blood Glucose   Pre-Exercise 129 mg/dL 205 mg/dL 190 mg/dL 230 mg/dL 161 mg/dL   Post-Exercise 110 mg/dL 149 mg/dL 117 mg/dL 168 mg/dL 102 mg/dL  ate peanut butter crackers post exercise   Row Name 06/22/17 1251 06/27/17 1200 07/04/17 1300         POCT Blood Glucose   Pre-Exercise 119 mg/dL 197 mg/dL 150 mg/dL     Post-Exercise 122 mg/dL 94 mg/dL 123 mg/dL        ADL UCSD:     Pulmonary Assessment Scores    Row Name 04/12/17 1200         ADL UCSD   ADL Phase Entry     SOB Score total 65       CAT Score   CAT Score 24  Pre        Pulmonary Function Assessment:     Pulmonary Function Assessment - 03/27/17 1108      Breath   Bilateral Breath Sounds Other  fine crackles in lower lung fields bilat   Shortness of Breath Yes;Limiting activity      Exercise Target Goals:    Exercise Program Goal: Individual exercise prescription set with THRR, safety & activity barriers. Participant demonstrates ability to understand and report RPE using BORG scale, to self-measure pulse accurately, and to acknowledge the importance of the exercise prescription.  Exercise Prescription Goal: Starting with aerobic activity 30 plus minutes a day, 3 days per week for initial exercise prescription. Provide home exercise prescription and guidelines that participant acknowledges understanding prior to discharge.  Activity Barriers & Risk Stratification:      Activity Barriers & Cardiac Risk Stratification - 03/27/17 1031  Activity Barriers & Cardiac Risk Stratification   Activity Barriers Shortness of Breath;Neck/Spine Problems;Back Problems;Balance Concerns;History of Falls;Assistive Device;Deconditioning      6 Minute Walk:     6 Minute Walk    Row Name 03/31/17 1104         6 Minute Walk   Phase Initial     Distance 600 feet     Walk Time 6 minutes     # of Rest Breaks 0     MPH 1.13     METS 1.84     RPE 14     Perceived Dyspnea  3     Symptoms Yes (comment)     Comments foot drag, unstable even with assistive device     Resting HR 72 bpm     Resting BP 104/98     Max Ex. HR 85 bpm     Max Ex. BP 134/50       Interval HR   Baseline HR 72     1 Minute HR 84     2 Minute HR 80     3 Minute HR 85     4 Minute HR 95     5 Minute HR 85     6 Minute HR 81     2 Minute Post HR 74     Interval Heart Rate? Yes       Interval Oxygen   Interval Oxygen? Yes     Baseline Oxygen Saturation % 97 %     Baseline Liters of Oxygen 0 L     1 Minute Oxygen Saturation % 94 %     1 Minute Liters of Oxygen 0 L     2 Minute Oxygen Saturation % 91 %     2 Minute Liters of Oxygen 0 L     3 Minute Oxygen Saturation % 93 %     3 Minute Liters of Oxygen 0 L     4 Minute Oxygen Saturation % 93 %     4 Minute Liters of Oxygen 0 L     5 Minute Oxygen Saturation % 93 %     5 Minute Liters of Oxygen 0 L     6 Minute Oxygen Saturation % 95 %     6 Minute Liters of Oxygen 0 L     2 Minute Post Oxygen Saturation % 97 %     2 Minute Post Liters of Oxygen 0 L        Oxygen Initial Assessment:     Oxygen Initial Assessment - 03/31/17 1113      Initial 6 min Walk   Oxygen Used None   Resting Oxygen Saturation  during 6 min walk 91 %     Program Oxygen Prescription   Program Oxygen Prescription None      Oxygen Re-Evaluation:     Oxygen Re-Evaluation    Row Name 04/11/17 1447 05/09/17 0737 06/05/17 1532 07/04/17 1319        Program Oxygen Prescription   Program Oxygen Prescription None None None None      Home Oxygen   Home Oxygen Device None None None None    Sleep Oxygen Prescription  -  -  - None    Home Exercise Oxygen Prescription  -  -  - None    Home at Rest Exercise Oxygen Prescription  -  -  - None       Oxygen Discharge (Final Oxygen Re-Evaluation):  Oxygen Re-Evaluation - 07/04/17 1319      Program Oxygen Prescription   Program Oxygen Prescription None     Home Oxygen   Home Oxygen Device None   Sleep Oxygen Prescription None   Home Exercise Oxygen Prescription None   Home at Rest Exercise Oxygen Prescription None      Initial Exercise Prescription:     Initial Exercise Prescription - 03/31/17 1100      Date of Initial Exercise RX and Referring Provider   Date 03/31/17   Referring Provider Dr. Chase Caller     NuStep   Level 2   Minutes 34   METs 1.5     Stairs   Sets 5   Minutes 17     Prescription Details   Frequency (times per week) 2   Duration Progress to 45 minutes of aerobic exercise without signs/symptoms of physical distress     Intensity   THRR 40-80% of Max Heartrate 58-116   Ratings of Perceived Exertion 11-13   Perceived Dyspnea 0-4     Progression   Progression Continue progressive overload as per policy without signs/symptoms or physical distress.     Resistance Training   Training Prescription Yes   Weight blue bands   Reps 10-15      Perform Capillary Blood Glucose checks as needed.  Exercise Prescription Changes:     Exercise Prescription Changes    Row Name 04/11/17 1200 04/13/17 1200 04/18/17 1200 04/20/17 1200 04/25/17 1200     Response to Exercise   Blood Pressure (Admit) 116/64 104/56 104/50 124/54 102/44   Blood Pressure (Exercise) 142/52 142/52 124/54 122/74 132/70   Blood Pressure (Exit) 108/64 114/58 100/58 100/46 100/46   Heart Rate (Admit) 63 bpm 56 bpm 61 bpm 70 bpm 63 bpm   Heart Rate (Exercise) 72 bpm 65 bpm 83 bpm  65 bpm 81 bpm   Heart Rate (Exit) 68 bpm 57 bpm 69 bpm 61 bpm 70 bpm   Oxygen Saturation (Admit) 95 % 98 % 97 % 97 % 98 %   Oxygen Saturation (Exercise) 95 % 99 % 98 % 97 % 94 %   Oxygen Saturation (Exit) 97 % 99 % 97 % 98 % 97 %   Rating of Perceived Exertion (Exercise) _0 Perceived Dyspnea (Exercise) _1 Duration Progress to 45 minutes of aerobic exercise without signs/symptoms of physical distress Progress to 45 minutes of aerobic exercise without signs/symptoms of physical distress Progress to 45 minutes of aerobic exercise without signs/symptoms of physical distress Progress to 45 minutes of aerobic exercise without signs/symptoms of physical distress Progress to 45 minutes of aerobic exercise without signs/symptoms of physical distress   Intensity Other (comment)  % of HRR- THRR unchanged THRR unchanged THRR unchanged THRR unchanged     Progression   Progression Continue to progress workloads to maintain intensity without signs/symptoms of physical distress. Continue to progress workloads to maintain intensity without signs/symptoms of physical distress. Continue to progress workloads to maintain intensity without signs/symptoms of physical distress. Continue to progress workloads to maintain intensity without signs/symptoms of physical distress. Continue to progress workloads to maintain intensity without signs/symptoms of physical distress.     Resistance Training   Training Prescription _2    Weight _3    Reps 10-15 10-15 10-15 10-15 10-15   Time 10 Minutes 10 Minutes 10 Minutes 10 Minutes 10  Minutes     Interval Training   Interval Training  - No No No No     NuStep   Level _0 Minutes 34 34 34 34 34   METs 1.9 1.8 1.9 2.1 2.1     Stairs   Minutes 17  - 104  - 32   Row Name 04/27/17 1200 05/02/17 1200 05/04/17 1200 05/09/17 1500 05/11/17 1218     Response to Exercise   Blood  Pressure (Admit) 104/60 124/66 110/52 96/42 102/50   Blood Pressure (Exercise) 100/60 136/70 124/68 127/57 120/50   Blood Pressure (Exit) 92/54 96/54 106/54 94/46 100/56   Heart Rate (Admit) 72 bpm 75 bpm 78 bpm 74 bpm 62 bpm   Heart Rate (Exercise) 82 bpm 89 bpm 80 bpm 95 bpm 88 bpm   Heart Rate (Exit) 69 bpm 76 bpm 73 bpm 80 bpm 77 bpm   Oxygen Saturation (Admit) 98 % 97 % 100 % 97 % 99 %   Oxygen Saturation (Exercise) 98 % 93 % 93 % 90 % 92 %   Oxygen Saturation (Exit) 98 % 97 % 97 % 96 % 97 %   Rating of Perceived Exertion (Exercise) _1 Perceived Dyspnea (Exercise) _2 Duration Progress to 45 minutes of aerobic exercise without signs/symptoms of physical distress Progress to 45 minutes of aerobic exercise without signs/symptoms of physical distress Progress to 45 minutes of aerobic exercise without signs/symptoms of physical distress Progress to 45 minutes of aerobic exercise without signs/symptoms of physical distress Progress to 45 minutes of aerobic exercise without signs/symptoms of physical distress   Intensity _3      Progression   Progression Continue to progress workloads to maintain intensity without signs/symptoms of physical distress. Continue to progress workloads to maintain intensity without signs/symptoms of physical distress. Continue to progress workloads to maintain intensity without signs/symptoms of physical distress. Continue to progress workloads to maintain intensity without signs/symptoms of physical distress. Continue to progress workloads to maintain intensity without signs/symptoms of physical distress.     Resistance Training   Training Prescription _4    Weight _5    Reps 10-15 10-15 10-15 10-15 10-15   Time 10 Minutes 10 Minutes 10 Minutes 10 Minutes 10 Minutes     Interval Training   Interval Training  _6      NuStep   Level _7 Minutes 34 34 17 34 34   METs 2.5 2.9 2.4 2 2.2     Stairs   Minutes _8 Row Name 05/16/17 1200 05/18/17 1200 05/23/17 1212 05/25/17 1200 05/30/17 1200     Response to Exercise   Blood Pressure (Admit) 90/58 102/52 98/50 104/62 106/60   Blood Pressure (Exercise) 108/64 112/52 134/60 100/60 120/54   Blood Pressure (Exit) 104/64 92/60 102/68 100/66 90/40   Heart Rate (Admit) 71 bpm 70 bpm 72 bpm 71 bpm 70 bpm   Heart Rate (Exercise) 78 bpm 79 bpm 79 bpm 80 bpm 84 bpm   Heart Rate (Exit) 72 bpm 59 bpm 71 bpm 72 bpm 78 bpm   Oxygen Saturation (Admit) 98 % 98 % 98 % 98 % 99 %   Oxygen Saturation (Exercise) 94 % 94 % 93 % 94 %  95 %   Oxygen Saturation (Exit) 97 % 97 % 97 % 98 % 98 %   Rating of Perceived Exertion (Exercise) _0 Perceived Dyspnea (Exercise) _1 Duration Progress to 45 minutes of aerobic exercise without signs/symptoms of physical distress Progress to 45 minutes of aerobic exercise without signs/symptoms of physical distress Progress to 45 minutes of aerobic exercise without signs/symptoms of physical distress Progress to 45 minutes of aerobic exercise without signs/symptoms of physical distress Progress to 45 minutes of aerobic exercise without signs/symptoms of physical distress   Intensity _2      Progression   Progression Continue to progress workloads to maintain intensity without signs/symptoms of physical distress. Continue to progress workloads to maintain intensity without signs/symptoms of physical distress. Continue to progress workloads to maintain intensity without signs/symptoms of physical distress. Continue to progress workloads to maintain intensity without signs/symptoms of physical distress. Continue to progress workloads to maintain intensity without signs/symptoms of physical distress.     Resistance Training    Training Prescription _3    Weight _4    Reps 10-15 10-15 10-15 10-15 10-15   Time 10 Minutes 10 Minutes 10 Minutes 10 Minutes 10 Minutes     Interval Training   Interval Training _5      Bike   Level  -  - 0.5 0.5 0.8   Minutes  -  - _6 NuStep   Level _7 - 5   Minutes 34 17 17  - 17   METs 2.3 2.2 2.4  - 2.4     Stairs   Minutes _8 Row Name 06/01/17 1200 06/06/17 1200 06/08/17 1221 06/13/17 1200 06/15/17 1200     Response to Exercise   Blood Pressure (Admit) 114/50 104/56 124/70 110/60 104/54   Blood Pressure (Exercise) 126/60 110/54 140/60 120/70 120/70   Blood Pressure (Exit) 1_9 106/54 110/66   Heart Rate (Admit) 69 bpm 75 bpm 66 bpm 55 bpm 68 bpm   Heart Rate (Exercise) 76 bpm 84 bpm 83 bpm 87 bpm 78 bpm   Heart Rate (Exit) 72 bpm 74 bpm 74 bpm 87 bpm 69 bpm   Oxygen Saturation (Admit) 98 % 97 % 99 % 99 % 98 %   Oxygen Saturation (Exercise) 98 % 94 % 93 % 92 % 98 %   Oxygen Saturation (Exit) 96 % 97 % 98 % 98 % 98 %   Rating of Perceived Exertion (Exercise) _10 Perceived Dyspnea (Exercise) _11 0   Duration Progress to 45 minutes of aerobic exercise without signs/symptoms of physical distress Progress to 45 minutes of aerobic exercise without signs/symptoms of physical distress Progress to 45 minutes of aerobic exercise without signs/symptoms of physical distress Progress to 45 minutes of aerobic exercise without signs/symptoms of physical distress Continue with 45 min of aerobic exercise without signs/symptoms of physical distress.   Intensity _12      Progression   Progression Continue to progress workloads to maintain intensity without signs/symptoms of physical distress. Continue to progress workloads to maintain intensity without signs/symptoms of physical  distress. Continue to progress workloads to maintain intensity without signs/symptoms of  physical distress. Continue to progress workloads to maintain intensity without signs/symptoms of physical distress. Continue to progress workloads to maintain intensity without signs/symptoms of physical distress.     Resistance Training   Training Prescription _0    Weight _1    Reps 10-15 10-15 10-15 10-15 10-15   Time 10 Minutes 10 Minutes 10 Minutes 10 Minutes 10 Minutes     Interval Training   Interval Training _2      Bike   Level 0.8 0._3 Minutes _4 NuStep   Level 5 6  - 6 6   Minutes 17 17  - 17 17   METs 2.4 2.6  - 2.7 2.4     Stairs   Minutes  - _5 -   Row Name 06/20/17 1200 06/22/17 1200 06/27/17 1100 07/04/17 1300       Response to Exercise   Blood Pressure (Admit) 120/52 114/54 104/54 100/50    Blood Pressure (Exercise) 130/68 144/70 134/62 124/62    Blood Pressure (Exit) 120/62 120/54 110/62 108/72    Heart Rate (Admit) 60 bpm 59 bpm 70 bpm 71 bpm    Heart Rate (Exercise) 75 bpm 79 bpm 83 bpm 87 bpm    Heart Rate (Exit) 67 bpm 67 bpm 76 bpm 76 bpm    Oxygen Saturation (Admit) 97 % 98 % 97 % 99 %    Oxygen Saturation (Exercise) 996 % 94 % 93 % 92 %    Oxygen Saturation (Exit) 98 % 99 % 94 % 97 %    Rating of Perceived Exertion (Exercise) _6 Perceived Dyspnea (Exercise) 0 _7 Duration Continue with 45 min of aerobic exercise without signs/symptoms of physical distress. Continue with 45 min of aerobic exercise without signs/symptoms of physical distress. Continue with 45 min of aerobic exercise without signs/symptoms of physical distress. Continue with 45 min of aerobic exercise without signs/symptoms of physical distress.    Intensity THRR unchanged THRR unchanged THRR unchanged THRR unchanged      Progression   Progression Continue to progress  workloads to maintain intensity without signs/symptoms of physical distress. Continue to progress workloads to maintain intensity without signs/symptoms of physical distress. Continue to progress workloads to maintain intensity without signs/symptoms of physical distress. Continue to progress workloads to maintain intensity without signs/symptoms of physical distress.      Resistance Training   Training Prescription Yes Yes Yes Yes    Weight blue bands blue bands blue bands blue bands    Reps 10-15 10-15 10-15 10-15    Time 10 Minutes 10 Minutes 10 Minutes 10 Minutes      Interval Training   Interval Training No No No No      Bike   Level 1.2  - 1.2 1.2    Minutes 17  - 17 17      Stairs   Minutes _8 Track   Laps _9 Minutes _10 Exercise Comments:   Exercise Goals and Review:     Exercise Goals    Row Name 03/27/17 1034             Exercise Goals   Increase Physical  Activity Yes       Intervention Provide advice, education, support and counseling about physical activity/exercise needs.;Develop an individualized exercise prescription for aerobic and resistive training based on initial evaluation findings, risk stratification, comorbidities and participant's personal goals.       Expected Outcomes Achievement of increased cardiorespiratory fitness and enhanced flexibility, muscular endurance and strength shown through measurements of functional capacity and personal statement of participant.       Increase Strength and Stamina Yes       Intervention Provide advice, education, support and counseling about physical activity/exercise needs.;Develop an individualized exercise prescription for aerobic and resistive training based on initial evaluation findings, risk stratification, comorbidities and participant's personal goals.       Expected Outcomes Achievement of increased cardiorespiratory fitness and enhanced flexibility, muscular  endurance and strength shown through measurements of functional capacity and personal statement of participant.          Exercise Goals Re-Evaluation :     Exercise Goals Re-Evaluation    Row Name 05/08/17 1121 06/06/17 1312 06/30/17 1533         Exercise Goal Re-Evaluation   Exercise Goals Review Increase Physical Activity;Increase Strenth and Stamina Increase Physical Activity;Increase Strenth and Stamina Increase Strenth and Stamina;Increase Physical Activity     Comments Patient is progressing well in rehab. He states that he already sees a difference in how he feels at home preforming ADL's. Attendance is consistent. Will cont. to monitor and progress as able.  Patient is progressing well in rehab. He is open and motivated to work harder when directed. He averages 2.4-2.6 METS. Stability and balance are an issue--that is why he is not walking on the track.  Patient is progressing well in rehab. He is open and motivated to work harder when directed. He averages 2.4-2.6 METS. Stability and balance are an issue but improving per patient. He is now walking the track using a walker. Will cont. to monitor and progress as able.      Expected Outcomes Through exercise at rehab and at home patient will be able to increase physical activity, strength, and stamina. Through exercise at rehab and at home patient will be able to increase physical activity, strength, and stamina. Through exercise at rehab and at home patient will be able to increase physical activity, strength, and stamina.        Discharge Exercise Prescription (Final Exercise Prescription Changes):     Exercise Prescription Changes - 07/04/17 1300      Response to Exercise   Blood Pressure (Admit) 100/50   Blood Pressure (Exercise) 124/62   Blood Pressure (Exit) 108/72   Heart Rate (Admit) 71 bpm   Heart Rate (Exercise) 87 bpm   Heart Rate (Exit) 76 bpm   Oxygen Saturation (Admit) 99 %   Oxygen Saturation (Exercise) 92 %    Oxygen Saturation (Exit) 97 %   Rating of Perceived Exertion (Exercise) 13   Perceived Dyspnea (Exercise) 1   Duration Continue with 45 min of aerobic exercise without signs/symptoms of physical distress.   Intensity THRR unchanged     Progression   Progression Continue to progress workloads to maintain intensity without signs/symptoms of physical distress.     Resistance Training   Training Prescription Yes   Weight blue bands   Reps 10-15   Time 10 Minutes     Interval Training   Interval Training No     Bike   Level 1.2   Minutes 17  Stairs   Minutes 17     Track   Laps 11   Minutes 17      Nutrition:  Target Goals: Understanding of nutrition guidelines, daily intake of sodium <1525m, cholesterol <2052m calories 30% from fat and 7% or less from saturated fats, daily to have 5 or more servings of fruits and vegetables.  Biometrics:     Pre Biometrics - 03/27/17 1034      Pre Biometrics   Grip Strength 30 kg       Nutrition Therapy Plan and Nutrition Goals:     Nutrition Therapy & Goals - 05/09/17 1421      Nutrition Therapy   Diet Carb Modified     Personal Nutrition Goals   Nutrition Goal Wt loss of 1-2 lb/week to a wt loss goal of 6-24 lb at graduation from [facility] Improved blood glucose control as evidenced by pt's A1c trending toward 7 or less     Intervention Plan   Intervention Prescribe, educate and counsel regarding individualized specific dietary modifications aiming towards targeted core components such as weight, hypertension, lipid management, diabetes, heart failure and other comorbidities.   Expected Outcomes Short Term Goal: Understand basic principles of dietary content, such as calories, fat, sodium, cholesterol and nutrients.;Long Term Goal: Adherence to prescribed nutrition plan.      Nutrition Discharge: Rate Your Plate Scores:     Nutrition Assessments - 05/09/17 1421      Rate Your Plate Scores    Pre Score 49      Nutrition Goals Re-Evaluation:   Nutrition Goals Discharge (Final Nutrition Goals Re-Evaluation):   Psychosocial: Target Goals: Acknowledge presence or absence of significant depression and/or stress, maximize coping skills, provide positive support system. Participant is able to verbalize types and ability to use techniques and skills needed for reducing stress and depression.  Initial Review & Psychosocial Screening:     Initial Psych Review & Screening - 03/27/17 1056      Initial Review   Current issues with None Identified     Family Dynamics   Good Support System? Yes     Barriers   Psychosocial barriers to participate in program There are no identifiable barriers or psychosocial needs.     Screening Interventions   Interventions Encouraged to exercise      Quality of Life Scores:   PHQ-9: Recent Review Flowsheet Data    [facility] 03[facility]2017   Decreased Interest 0   Down, Depressed, Hopeless 0   PHQ - 2 Score 0     Interpretation of Total Score  Total Score Depression Severity:  1-4 = Minimal depression, 5-9 = Mild depression, 10-14 = Moderate depression, 15-19 = Moderately severe depression, 20-27 = Severe depression   Psychosocial Evaluation and Intervention:     Psychosocial Evaluation - 05/09/17 0740      Psychosocial Evaluation & Interventions   Interventions Encouraged to exercise with the program and follow exercise prescription   Comments no psychosocial barriers identified over the past 30 days   Expected Outcomes patient will remain free of psychosocial barriers to participation in pulmonary rehab   Continue Psychosocial Services  No Follow up required      Psychosocial Re-Evaluation:     Psychosocial Re-Evaluation    [place] 05/09/17 0741 06/05/17 1540 07/04/17 1326         Psychosocial Re-Evaluation   Current issues with None Identified None Identified None Identified  Expected Outcomes  -  patient will remain free of psychosocial barrriers  patient will remain free of psychosocial barrriers      Interventions  - Encouraged to attend Pulmonary Rehabilitation for the exercise Encouraged to attend Pulmonary Rehabilitation for the exercise     Continue Psychosocial Services  No Follow up required No Follow up required No Follow up required        Psychosocial Discharge (Final Psychosocial Re-Evaluation):     Psychosocial Re-Evaluation - 07/04/17 1326      Psychosocial Re-Evaluation   Current issues with None Identified   Expected Outcomes patient will remain free of psychosocial barrriers    Interventions Encouraged to attend Pulmonary Rehabilitation for the exercise   Continue Psychosocial Services  No Follow up required      Education: Education Goals: Education classes will be provided on a weekly basis, covering required topics. Participant will state understanding/return demonstration of topics presented.  Learning Barriers/Preferences:     Learning Barriers/Preferences - 03/27/17 1055      Learning Barriers/Preferences   Learning Barriers None   Learning Preferences Computer/Internet;Group Instruction;Verbal Instruction;Written Material;Individual Instruction      Education Topics: Risk Factor Reduction:  -Group instruction that is supported by a PowerPoint presentation. Instructor discusses the definition of a risk factor, different risk factors for pulmonary disease, and how the heart and lungs work together.     Nutrition for Pulmonary Patient:  -Group instruction provided by PowerPoint slides, verbal discussion, and written materials to support subject matter. The instructor gives an explanation and review of healthy diet recommendations, which includes a discussion on weight management, recommendations for fruit and vegetable consumption, as well as protein, fluid, caffeine, fiber, sodium, sugar, and alcohol. Tips for eating when patients are short of  breath are discussed.   PULMONARY REHAB OTHER RESPIRATORY from 06/22/2017 in [place]  Date  05/25/17  Educator  RD  Instruction Review Code  2- meets goals/outcomes      Pursed Lip Breathing:  -Group instruction that is supported by demonstration and informational handouts. Instructor discusses the benefits of pursed lip and diaphragmatic breathing and detailed demonstration on how to preform both.     Oxygen Safety:  -Group instruction provided by PowerPoint, verbal discussion, and written material to support subject matter. There is an overview of "What is Oxygen" and "Why do we need it".  Instructor also reviews how to create a safe environment for oxygen use, the importance of using oxygen as prescribed, and the risks of noncompliance. There is a brief discussion on traveling with oxygen and resources the patient may utilize.   PULMONARY REHAB OTHER RESPIRATORY from 06/22/2017 in [place]  Date  05/18/17  Educator  Truddie Crumble  Instruction Review Code  2- meets goals/outcomes      Oxygen Equipment:  -Group instruction provided by [employer] Staff utilizing handouts, written materials, and equipment demonstrations.   PULMONARY REHAB OTHER RESPIRATORY from 06/22/2017 in [place]  Date  04/27/17  Educator  [facility]  Instruction Review Code  2- meets goals/outcomes      Signs and Symptoms:  -Group instruction provided by written material and verbal discussion to support subject matter. Warning signs and symptoms of infection, stroke, and heart attack are reviewed and when to call the physician/911 reinforced. Tips for preventing the spread of infection discussed.   PULMONARY REHAB OTHER RESPIRATORY from 06/22/2017 in [place]  Date  06/15/17  Educator  rn  Instruction Review Code  2- meets goals/outcomes      Advanced Directives:  -Group  instruction provided by verbal instruction and written material to support subject matter. Instructor reviews Advanced Directive laws and proper instruction for filling out document.   Pulmonary Video:  -Group video education that reviews the importance of medication and oxygen compliance, exercise, good nutrition, pulmonary hygiene, and pursed lip and diaphragmatic breathing for the pulmonary patient.   Exercise for the Pulmonary Patient:  -Group instruction that is supported by a PowerPoint presentation. Instructor discusses benefits of exercise, core components of exercise, frequency, duration, and intensity of an exercise routine, importance of utilizing pulse oximetry during exercise, safety while exercising, and options of places to exercise outside of rehab.     PULMONARY REHAB OTHER RESPIRATORY from 06/22/2017 in [place]  Date  05/25/17  Educator  ep  Instruction Review Code  2- meets goals/outcomes      Pulmonary Medications:  -Verbally interactive group education provided by instructor with focus on inhaled medications and proper administration.   PULMONARY REHAB OTHER RESPIRATORY from 06/22/2017 in [place]  Date  04/20/17  Educator  Pharm  Instruction Review Code  2- meets goals/outcomes      Anatomy and Physiology of the Respiratory System and Intimacy:  -Group instruction provided by PowerPoint, verbal discussion, and written material to support subject matter. Instructor reviews respiratory cycle and anatomical components of the respiratory system and their functions. Instructor also reviews differences in obstructive and restrictive respiratory diseases with examples of each. Intimacy, Sex, and Sexuality differences are reviewed with a discussion on how relationships can change when diagnosed with pulmonary disease. Common sexual concerns are reviewed.   PULMONARY REHAB OTHER RESPIRATORY from 06/22/2017 in  [place]  Date  06/22/17  Educator  RN  Instruction Review Code  2- meets goals/outcomes      MD DAY -A group question and answer session with a medical doctor that allows participants to ask questions that relate to their pulmonary disease state.   PULMONARY REHAB OTHER RESPIRATORY from 06/22/2017 in [place]  Date  06/08/17  Educator  Nelda Marseille  Instruction Review Code  2- meets goals/outcomes      OTHER EDUCATION -Group or individual verbal, written, or video instructions that support the educational goals of the pulmonary rehab program.   Knowledge Questionnaire Score:     Knowledge Questionnaire Score - 04/12/17 1200      Knowledge Questionnaire Score   Pre Score 9/13      Core Components/Risk Factors/Patient Goals at Admission:     Personal Goals and Risk Factors at Admission - 05/09/17 1426      Core Components/Risk Factors/Patient Goals on Admission    Weight Management Obesity;Yes   Intervention Weight Management: Develop a combined nutrition and exercise program designed to reach desired caloric intake, while maintaining appropriate intake of nutrient and fiber, sodium and fats, and appropriate energy expenditure required for the weight goal.;Obesity: Provide education and appropriate resources to help participant work on and attain dietary goals.;Weight Management/Obesity: Establish reasonable short term and long term weight goals.;Weight Management: Provide education and appropriate resources to help participant work on and attain dietary goals.   Admit Weight 261 lb (118.4 kg)   Goal Weight: Short Term 255 lb (115.7 kg)   Goal Weight: Long Term 240 lb (108.9 kg)   Expected Outcomes Short Term:  Continue to assess and modify interventions until short term weight is achieved;Understanding of distribution of calorie intake throughout the day with the consumption of 4-5 meals/snacks;Understanding  recommendations for meals to include 15-35% energy as protein, 25-35% energy from fat, 35-60% energy from carbohydrates, less than 261m of dietary cholesterol, 20-35 gm of total fiber daily;Weight Loss: Understanding of general recommendations for a balanced deficit meal plan, which promotes 1-2 lb weight loss per week and includes a negative energy balance of [phone] kcal/d   Improve shortness of breath with ADL's Yes   Intervention Provide education, individualized exercise plan and daily activity instruction to help decrease symptoms of SOB with activities of daily living.   Expected Outcomes Short Term: Achieves a reduction of symptoms when performing activities of daily living.   Develop more efficient breathing techniques such as purse lipped breathing and diaphragmatic breathing; and practicing self-pacing with activity Yes   Intervention Provide education, demonstration and support about specific breathing techniuqes utilized for more efficient breathing. Include techniques such as pursed lipped breathing, diaphragmatic breathing and self-pacing activity.   Expected Outcomes Short Term: Participant will be able to demonstrate and use breathing techniques as needed throughout daily activities.   Diabetes Yes   Intervention Provide education about signs/symptoms and action to take for hypo/hyperglycemia.;Provide education about proper nutrition, including hydration, and aerobic/resistive exercise prescription along with prescribed medications to achieve blood glucose in normal ranges: Fasting glucose 65-99 mg/dL   Expected Outcomes Short Term: Participant verbalizes understanding of the signs/symptoms and immediate care of hyper/hypoglycemia, proper foot care and importance of medication, aerobic/resistive exercise and nutrition plan for blood glucose control.;Long Term: Attainment of HbA1C < 7%.      Core Components/Risk Factors/Patient Goals Review:      Goals and Risk Factor Review    Row  Name 05/09/17 0739 06/05/17 1533 07/04/17 1320         Core Components/Risk Factors/Patient Goals Review   Personal Goals Review Weight Management/Obesity;Diabetes;Develop more efficient breathing techniques such as purse lipped breathing and diaphragmatic breathing and practicing self-pacing with activity.;Improve shortness of breath with ADL's Weight Management/Obesity;Diabetes;Develop more efficient breathing techniques such as purse lipped breathing and diaphragmatic breathing and practicing self-pacing with activity.;Improve shortness of breath with ADL's Weight Management/Obesity;Diabetes;Develop more efficient breathing techniques such as purse lipped breathing and diaphragmatic breathing and practicing self-pacing with activity.;Improve shortness of breath with ADL's     Review see comment section on ITP JDavontayis maintaining his slow weight loss trend. He was admitted with a max weight of 121.4 and his current weight is 118.6. 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 He self paces in order to maximixe the ammount of time he is able to exercise without restbreaks and verbalize a decrease in his shortness of breath with exertion. JMuzammilcontinues to do well in pulmonary rehab. His rehab sessions have been extended to 36 because he feels he is just beginning to make tremendous strides in rehab. He is able to walk the track with a walker, something he says he has not done in some time. His self confidence as it relates to his ability to be active has significantly improved. He continues to pace himself to extend the time between restbreaks. He has a better understanding of how exercise also lowers his bloodsugars and has a better understanding of why good nutrition allows him to exercise without having  significantly low CBGs.      Expected Outcomes see Admission expected outcomes  see Admission expected outcomes see Admission expected outcomes        Core Components/Risk Factors/Patient Goals at Discharge (Final Review):      Goals and Risk Factor Review - 07/04/17 1320      Core Components/Risk Factors/Patient Goals Review   Personal Goals Review Weight Management/Obesity;Diabetes;Develop more efficient breathing techniques such as purse lipped breathing and diaphragmatic breathing and practicing self-pacing with activity.;Improve shortness of breath with ADL's   Review Hershall continues to do well in pulmonary rehab. His rehab sessions have been extended to 36 because he feels he is just beginning to make tremendous strides in rehab. He is able to walk the track with a walker, something he says he has not done in some time. His self confidence as it relates to his ability to be active has significantly improved. He continues to pace himself to extend the time between restbreaks. He has a better understanding of how exercise also lowers his bloodsugars and has a better understanding of why good nutrition allows him to exercise without having significantly low CBGs.    Expected Outcomes see Admission expected outcomes      ITP Comments:   Comments: ITP REVIEW Pt is making expected progress toward pulmonary rehab goals after completing 24 sessions. Recommend continued exercise, life style modification, education, and utilization of breathing techniques to increase stamina and strength and decrease shortness of breath with exertion.

## 2017-07-06 ENCOUNTER — Encounter (HOSPITAL_COMMUNITY)
Admission: RE | Admit: 2017-07-06 | Discharge: 2017-07-06 | Disposition: A | Discharge status: Home / Self Care | Payer: Medicare Other | Source: Ambulatory Visit | Attending: Internal Medicine | Admitting: Internal Medicine

## 2017-07-06 ENCOUNTER — Encounter (HOSPITAL_COMMUNITY): Payer: Medicare Other

## 2017-07-06 VITALS — Wt 262.8 lb

## 2017-07-06 DIAGNOSIS — N189 Chronic kidney disease, unspecified: Secondary | ICD-10-CM | POA: Diagnosis not present

## 2017-07-06 DIAGNOSIS — E1122 Type 2 diabetes mellitus with diabetic chronic kidney disease: Secondary | ICD-10-CM | POA: Insufficient documentation

## 2017-07-06 DIAGNOSIS — G4730 Sleep apnea, unspecified: Secondary | ICD-10-CM | POA: Insufficient documentation

## 2017-07-06 DIAGNOSIS — K219 Gastro-esophageal reflux disease without esophagitis: Secondary | ICD-10-CM | POA: Insufficient documentation

## 2017-07-06 DIAGNOSIS — Z79899 Other long term (current) drug therapy: Secondary | ICD-10-CM | POA: Diagnosis not present

## 2017-07-06 DIAGNOSIS — Z794 Long term (current) use of insulin: Secondary | ICD-10-CM | POA: Diagnosis not present

## 2017-07-06 DIAGNOSIS — I129 Hypertensive chronic kidney disease with stage 1 through stage 4 chronic kidney disease, or unspecified chronic kidney disease: Secondary | ICD-10-CM | POA: Diagnosis not present

## 2017-07-06 DIAGNOSIS — E785 Hyperlipidemia, unspecified: Secondary | ICD-10-CM | POA: Insufficient documentation

## 2017-07-06 DIAGNOSIS — J84112 Idiopathic pulmonary fibrosis: Secondary | ICD-10-CM | POA: Diagnosis present

## 2017-07-06 DIAGNOSIS — Z7982 Long term (current) use of aspirin: Secondary | ICD-10-CM | POA: Diagnosis not present

## 2017-07-06 NOTE — Progress Notes (Signed)
Daily Session Note  Patient Details  Name: Shane [name] MRN: [id number] Date of Birth: 10/22/1941 Referring Provider:     Pulmonary Rehab Walk Test from 03/30/2017 in [place]  Referring Provider  Dr. Chase Caller      Encounter Date: 07/06/2017  Check In:     Session Check In - 07/06/17 1046      Check-In   Location [facility]   Staff Present Su Hilt, MS, ACSM RCEP, Exercise Physiologist;Lisa Ysidro Evert, RN;Portia Rollene Rotunda, RN, BSN   Supervising physician immediately available to respond to emergencies [facility] immediately available   Physician(s) Dr. Jonnie Finner   Medication changes reported     No   Fall or balance concerns reported    No   Tobacco Cessation No Change   Warm-up and Cool-down Performed as group-led instruction   Resistance Training Performed Yes   VAD Patient? No     Pain Assessment   Currently in Pain? No/denies   Multiple Pain Sites No      Capillary Blood Glucose: No results found for this or any previous visit (from the past 24 hour(s)).     POCT Glucose - 07/06/17 1218      POCT Blood Glucose   Pre-Exercise 164 mg/dL   Post-Exercise 129 mg/dL         Exercise Prescription Changes - 07/06/17 1200      Response to Exercise   Blood Pressure (Admit) 110/50   Blood Pressure (Exercise) 144/70   Blood Pressure (Exit) 92/40   Heart Rate (Admit) 64 bpm   Heart Rate (Exercise) 86 bpm   Heart Rate (Exit) 71 bpm   Oxygen Saturation (Admit) 99 %   Oxygen Saturation (Exercise) 96 %   Oxygen Saturation (Exit) 99 %   Rating of Perceived Exertion (Exercise) 13   Perceived Dyspnea (Exercise) 1   Duration Continue with 45 min of aerobic exercise without signs/symptoms of physical distress.   Intensity THRR unchanged     Progression   Progression Continue to progress workloads to maintain intensity without signs/symptoms of physical distress.     Resistance Training   Training Prescription  Yes   Weight blue bands   Reps 10-15   Time 10 Minutes     Interval Training   Interval Training No     Bike   Level 1.2   Minutes 17     Track   Laps 13   Minutes 17      History  Smoking Status  . Never Smoker  Smokeless Tobacco  . Never Used    Goals Met:  Exercise tolerated well No report of cardiac concerns or symptoms Strength training completed today  Goals Unmet:  Not Applicable  Comments: Service time is from 10:30a to 12:15p    Dr. Rush Farmer is Medical Director for Pulmonary Rehab at [facility].

## 2017-07-07 ENCOUNTER — Telehealth: Payer: Self-pay | Admitting: Internal Medicine

## 2017-07-07 NOTE — Telephone Encounter (Signed)
Forms have been faxed and am now waiting on PA.

## 2017-07-07 NOTE — Telephone Encounter (Signed)
Spoke with pts wife and she wanted to make sure that the [facility] paper work has been completed.  Spoke with ELise and she stated that she thinks that this has been done but will check and make sure.  Will forward to elsie to check for these papers.

## 2017-07-11 ENCOUNTER — Encounter (HOSPITAL_COMMUNITY)
Admission: RE | Admit: 2017-07-11 | Discharge: 2017-07-11 | Disposition: A | Discharge status: Home / Self Care | Payer: Medicare Other | Source: Ambulatory Visit | Attending: Internal Medicine | Admitting: Internal Medicine

## 2017-07-11 ENCOUNTER — Encounter (HOSPITAL_COMMUNITY): Payer: Medicare Other

## 2017-07-11 VITALS — Wt 261.0 lb

## 2017-07-11 DIAGNOSIS — J84112 Idiopathic pulmonary fibrosis: Secondary | ICD-10-CM

## 2017-07-11 NOTE — Progress Notes (Signed)
Daily Session Note  Patient Details  Name: Shane [name] MRN: [id number] Date of Birth: May 05, 1941 Referring Provider:     Pulmonary Rehab Walk Test from 03/30/2017 in [place]  Referring Provider  Dr. Chase Caller      Encounter Date: 07/11/2017  Check In:     Session Check In - 07/11/17 1208      Check-In   Location [facility]   Staff Present Su Hilt, MS, ACSM RCEP, Exercise Physiologist;Lisa Ysidro Evert, RN;Portia Rollene Rotunda, RN, BSN   Supervising physician immediately available to respond to emergencies [facility] immediately available   Physician(s) Dr. Allyson Sabal   Medication changes reported     No   Fall or balance concerns reported    No   Tobacco Cessation No Change   Warm-up and Cool-down Performed as group-led instruction   Resistance Training Performed Yes   VAD Patient? No     Pain Assessment   Currently in Pain? No/denies   Multiple Pain Sites No      Capillary Blood Glucose: No results found for this or any previous visit (from the past 24 hour(s)).      Exercise Prescription Changes - 07/11/17 1200      Response to Exercise   Blood Pressure (Admit) 114/60   Blood Pressure (Exercise) 114/52   Blood Pressure (Exit) 100/50   Heart Rate (Admit) 73 bpm   Heart Rate (Exercise) 90 bpm   Heart Rate (Exit) 72 bpm   Oxygen Saturation (Admit) 98 %   Oxygen Saturation (Exercise) 93 %   Oxygen Saturation (Exit) 98 %   Rating of Perceived Exertion (Exercise) 13   Perceived Dyspnea (Exercise) 1   Duration Continue with 45 min of aerobic exercise without signs/symptoms of physical distress.   Intensity THRR unchanged     Progression   Progression Continue to progress workloads to maintain intensity without signs/symptoms of physical distress.     Resistance Training   Training Prescription Yes   Weight blue bands   Reps 10-15   Time 10 Minutes     Interval Training   Interval Training No     Bike   Level 1.2   Minutes 17     Stairs   Minutes 17     Track   Laps 14   Minutes 17      History  Smoking Status  . Never Smoker  Smokeless Tobacco  . Never Used    Goals Met:  Exercise tolerated well No report of cardiac concerns or symptoms Strength training completed today  Goals Unmet:  Not Applicable  Comments: Service time is from 1030 to 1205    Dr. Rush Farmer is Medical Director for Pulmonary Rehab at [facility].

## 2017-07-11 NOTE — Telephone Encounter (Signed)
Patient's wife is calling to check on the status of OFEV.[place]. Please page wife [name] when calling her at work,

## 2017-07-11 NOTE — Telephone Encounter (Signed)
I have spoken with pt's wife, [name]. [name] is aware that we are still awaiting PA. [name] voiced her understanding.

## 2017-07-13 ENCOUNTER — Telehealth: Payer: Self-pay | Admitting: Internal Medicine

## 2017-07-13 ENCOUNTER — Encounter (HOSPITAL_COMMUNITY)
Admission: RE | Admit: 2017-07-13 | Discharge: 2017-07-13 | Disposition: A | Discharge status: Home / Self Care | Payer: Medicare Other | Source: Ambulatory Visit | Attending: Internal Medicine | Admitting: Internal Medicine

## 2017-07-13 ENCOUNTER — Encounter (HOSPITAL_COMMUNITY): Payer: Medicare Other

## 2017-07-13 VITALS — Wt 259.7 lb

## 2017-07-13 DIAGNOSIS — J84112 Idiopathic pulmonary fibrosis: Secondary | ICD-10-CM

## 2017-07-13 NOTE — Telephone Encounter (Signed)
Attempted to call [place]. Received a message to call back at later time due to high call volumes. Will try back.

## 2017-07-13 NOTE — Progress Notes (Signed)
Daily Session Note  Patient Details  Name: DATRELL DUNTON MRN: [id number] Date of Birth: July 20, 1941 Referring Provider:     Pulmonary Rehab Walk Test from 03/30/2017 in [place]  Referring Provider  Dr. Chase Caller      Encounter Date: 07/13/2017  Check In:     Session Check In - 07/13/17 1041      Check-In   Location [facility]   Staff Present Su Hilt, MS, ACSM RCEP, Exercise Physiologist;Lisa Ysidro Evert, RN;Portia Rollene Rotunda, RN, BSN   Supervising physician immediately available to respond to emergencies [facility] immediately available   Physician(s) Dr. Clementeen Graham   Medication changes reported     No   Fall or balance concerns reported    No   Tobacco Cessation No Change   Warm-up and Cool-down Performed as group-led instruction   Resistance Training Performed Yes   VAD Patient? No     Pain Assessment   Currently in Pain? No/denies   Multiple Pain Sites No      Capillary Blood Glucose: No results found for this or any previous visit (from the past 24 hour(s)).     POCT Glucose - 07/13/17 1301      POCT Blood Glucose   Pre-Exercise 170 mg/dL   Post-Exercise 125 mg/dL         Exercise Prescription Changes - 07/13/17 1258      Response to Exercise   Blood Pressure (Admit) 140/52   Blood Pressure (Exit) 94/50   Heart Rate (Admit) 66 bpm   Heart Rate (Exercise) 79 bpm   Heart Rate (Exit) 70 bpm   Oxygen Saturation (Admit) 97 %   Oxygen Saturation (Exercise) 95 %   Oxygen Saturation (Exit) 98 %   Rating of Perceived Exertion (Exercise) 12   Perceived Dyspnea (Exercise) 0   Duration Continue with 45 min of aerobic exercise without signs/symptoms of physical distress.   Intensity THRR unchanged     Progression   Progression Continue to progress workloads to maintain intensity without signs/symptoms of physical distress.     Resistance Training   Training Prescription Yes   Weight blue bands   Reps  10-15   Time 10 Minutes     Interval Training   Interval Training No     Stairs   Minutes 17     Track   Laps 12   Minutes 17      History  Smoking Status  . Never Smoker  Smokeless Tobacco  . Never Used    Goals Met:  Independence with exercise equipment Improved SOB with ADL's Using PLB without cueing & demonstrates good technique Exercise tolerated well No report of cardiac concerns or symptoms Strength training completed today  Goals Unmet:  Not Applicable  Comments: Service time is from 1030 to 1240   Dr. Rush Farmer is Medical Director for Pulmonary Rehab at [facility].

## 2017-07-14 ENCOUNTER — Telehealth: Payer: Self-pay | Admitting: *Deleted

## 2017-07-14 NOTE — Telephone Encounter (Signed)
PA on OFEV had been denied. Denial and appeal process should be faxed in next couple of hours.

## 2017-07-14 NOTE — Telephone Encounter (Signed)
Called and spoke with Caren Griffins at [employer], states that they were calling for Ofev and not furosemide.. See 8/10 phone note, PA has already been initiated.  Will close this duplicate encounter.

## 2017-07-14 NOTE — Telephone Encounter (Signed)
PA initiated for OFEV via covermymeds. Key: [facility] Pending approval.

## 2017-07-14 NOTE — Telephone Encounter (Signed)
Spoke with Alcario Drought with [employer] and was advised they did receive the start form and are processing it. Will await PA.

## 2017-07-17 ENCOUNTER — Telehealth: Payer: Self-pay | Admitting: Internal Medicine

## 2017-07-17 NOTE — Telephone Encounter (Signed)
Pt's wife is aware that I will send message to Daneil Dan to advise as its been about 3 weeks. Wife requests that Daneil Dan call her at her office number and cell number until she reaches her-DO NOT leave a message at either number provided.   Wife states patient is set to meet with OFEV rep oin 07/27/17.

## 2017-07-18 ENCOUNTER — Telehealth: Payer: Self-pay | Admitting: Internal Medicine

## 2017-07-18 ENCOUNTER — Encounter (HOSPITAL_COMMUNITY)
Admission: RE | Admit: 2017-07-18 | Discharge: 2017-07-18 | Disposition: A | Discharge status: Home / Self Care | Payer: Medicare Other | Source: Ambulatory Visit | Attending: Internal Medicine | Admitting: Internal Medicine

## 2017-07-18 ENCOUNTER — Encounter (HOSPITAL_COMMUNITY): Payer: Medicare Other

## 2017-07-18 VITALS — Wt 261.5 lb

## 2017-07-18 DIAGNOSIS — J84112 Idiopathic pulmonary fibrosis: Secondary | ICD-10-CM

## 2017-07-18 NOTE — Progress Notes (Signed)
Daily Session Note  Patient Details  Name: Shane [name] MRN: [id number] Date of Birth: January 26, 1941 Referring Provider:     Pulmonary Rehab Walk Test from 03/30/2017 in [place]  Referring Provider  Dr. Chase Caller      Encounter Date: 07/18/2017  Check In:     Session Check In - 07/18/17 1014      Check-In   Location [facility]   Staff Present Rodney Langton, RN;Molly diVincenzo, MS, ACSM RCEP, Exercise Physiologist   Supervising physician immediately available to respond to emergencies [facility] immediately available   Physician(s) Dr. Clementeen [place]   Medication changes reported     No   Fall or balance concerns reported    No   Tobacco Cessation No Change   Warm-up and Cool-down Performed as group-led instruction   Resistance Training Performed Yes   VAD Patient? No     Pain Assessment   Currently in Pain? No/denies   Multiple Pain Sites No      Capillary Blood Glucose: No results found for this or any previous visit (from the past 24 hour(s)).     POCT Glucose - 07/18/17 1241      POCT Blood Glucose   Pre-Exercise 235 mg/dL   Post-Exercise 92 mg/dL         Exercise Prescription Changes - 07/18/17 1200      Response to Exercise   Blood Pressure (Admit) 124/56   Blood Pressure (Exercise) 148/66   Blood Pressure (Exit) 100/44   Heart Rate (Admit) 61 bpm   Heart Rate (Exercise) 85 bpm   Heart Rate (Exit) 70 bpm   Oxygen Saturation (Admit) 97 %   Oxygen Saturation (Exercise) 93 %   Oxygen Saturation (Exit) 97 %   Rating of Perceived Exertion (Exercise) 13   Perceived Dyspnea (Exercise) 1   Duration Continue with 45 min of aerobic exercise without signs/symptoms of physical distress.   Intensity THRR unchanged     Progression   Progression Continue to progress workloads to maintain intensity without signs/symptoms of physical distress.     Resistance Training   Training Prescription Yes   Weight blue  bands   Reps 10-15   Time 10 Minutes     Interval Training   Interval Training No     Bike   Level 1.4   Minutes 17     Stairs   Minutes 17     Track   Laps 16   Minutes 17      History  Smoking Status  . Never Smoker  Smokeless Tobacco  . Never Used    Goals Met:  Exercise tolerated well No report of cardiac concerns or symptoms Strength training completed today  Goals Unmet:  Not Applicable  Comments: Service time is from 1030 to 1210    Dr. Rush Farmer is Medical Director for Pulmonary Rehab at [facility].

## 2017-07-18 NOTE — Telephone Encounter (Signed)
There are several messages open on the same matter. Per triage protocol, message will be closed.

## 2017-07-18 NOTE — Telephone Encounter (Signed)
Please see message from 07/18/17. Will sign off.

## 2017-07-18 NOTE — Telephone Encounter (Signed)
Spoke with Jaynee-aware that we are going to do appeal for OFEV. Will forward to [place] as requested.

## 2017-07-18 NOTE — Telephone Encounter (Signed)
Found completed forms in the Ofev follow-up folder in MR's cubby. Daneil Dan, is there anything we can do help with patient's Ofev? Please advise. Thanks!

## 2017-07-19 NOTE — Telephone Encounter (Signed)
Received appeal fax from [employer]. Appeal letter and supporting clinical information has been faxed back to [employer]. Will await decision.

## 2017-07-20 ENCOUNTER — Encounter (HOSPITAL_COMMUNITY): Payer: Medicare Other

## 2017-07-20 ENCOUNTER — Encounter (HOSPITAL_COMMUNITY)
Admission: RE | Admit: 2017-07-20 | Discharge: 2017-07-20 | Disposition: A | Discharge status: Home / Self Care | Payer: Medicare Other | Source: Ambulatory Visit | Attending: Internal Medicine | Admitting: Internal Medicine

## 2017-07-20 VITALS — Wt 261.2 lb

## 2017-07-20 DIAGNOSIS — J84112 Idiopathic pulmonary fibrosis: Secondary | ICD-10-CM | POA: Diagnosis not present

## 2017-07-20 NOTE — Telephone Encounter (Signed)
Pt's wife

## 2017-07-20 NOTE — Telephone Encounter (Signed)
Pt's wife [name] returning Elise's call.  Please call her at [phone] and make sure and tell the operator to come and find her.

## 2017-07-20 NOTE — Telephone Encounter (Signed)
Pt wife aware that we are still waiting on decision on coverage.  Will send to [facility] to follow up.  07/26/17 at [facility] teaching

## 2017-07-20 NOTE — Progress Notes (Signed)
Daily Session Note  Patient Details  Name: Shane [name] MRN: [id number] Date of Birth: 07/01/1941 Referring Provider:     Pulmonary Rehab Walk Test from 03/30/2017 in [facility]  Referring Provider  Dr. Ramaswamy      Encounter Date: 07/20/2017  Check In:     Session Check In - 07/20/17 1005      Check-In   Location [facility]   Staff Present Lisa [place], RN;Molly diVincenzo, MS, ACSM RCEP, Exercise Physiologist;Portia Payne, RN, BSN   Supervising physician immediately available to respond to emergencies [facility] immediately available   Physician(s) Dr. Vega   Medication changes reported     No   Fall or balance concerns reported    No   Tobacco Cessation No Change   Warm-up and Cool-down Performed as group-led instruction   Resistance Training Performed Yes   VAD Patient? No     VAD patient   Has back up controller? No     Pain Assessment   Currently in Pain? No/denies   Multiple Pain Sites No      Capillary Blood Glucose: No results found for this or any previous visit (from the past 24 hour(s)).      Exercise Prescription Changes - 07/20/17 1220      Response to Exercise   Blood Pressure (Admit) 130/52   Blood Pressure (Exercise) 120/62   Blood Pressure (Exit) 98/56   Heart Rate (Admit) 62 bpm   Heart Rate (Exercise) 83 bpm   Heart Rate (Exit) 73 bpm   Oxygen Saturation (Admit) 98 %   Oxygen Saturation (Exercise) 89 %   Oxygen Saturation (Exit) 95 %   Rating of Perceived Exertion (Exercise) 13   Perceived Dyspnea (Exercise) 1   Duration Continue with 45 min of aerobic exercise without signs/symptoms of physical distress.   Intensity THRR unchanged     Progression   Progression Continue to progress workloads to maintain intensity without signs/symptoms of physical distress.     Resistance Training   Training Prescription Yes   Weight blue bands   Reps 10-15   Time 10 Minutes     Interval  Training   Interval Training No     Bike   Level 1.4   Minutes 17     Stairs   Minutes 17     Track   Laps 16   Minutes 17      History  Smoking Status  . Never Smoker  Smokeless Tobacco  . Never Used    Goals Met:  Independence with exercise equipment Improved SOB with ADL's Using PLB without cueing & demonstrates good technique Exercise tolerated well No report of cardiac concerns or symptoms Strength training completed today  Goals Unmet:  Not Applicable  Comments: Service time is from 1030 to 1210   Dr. Wesam G. Yacoub is Medical Director for Pulmonary Rehab at [facility]. 

## 2017-07-24 NOTE — Telephone Encounter (Signed)
Melissa with [facility] has sent a fax concerning the medication AFEV with questions concerning the medication.  Needs an answer by 1 p.m. [phone] [id number]

## 2017-07-24 NOTE — Telephone Encounter (Signed)
Lm for Melissa with [facility] care.

## 2017-07-25 ENCOUNTER — Encounter (HOSPITAL_COMMUNITY)
Admission: RE | Admit: 2017-07-25 | Discharge: 2017-07-25 | Disposition: A | Discharge status: Home / Self Care | Payer: Medicare Other | Source: Ambulatory Visit | Attending: Internal Medicine | Admitting: Internal Medicine

## 2017-07-25 ENCOUNTER — Encounter (HOSPITAL_COMMUNITY): Payer: Medicare Other

## 2017-07-25 VITALS — Wt 262.3 lb

## 2017-07-25 DIAGNOSIS — J84112 Idiopathic pulmonary fibrosis: Secondary | ICD-10-CM | POA: Diagnosis not present

## 2017-07-25 NOTE — Telephone Encounter (Signed)
EE did you receive the form from [facility] with [facility] about the questions that needed answering?  Thanks

## 2017-07-25 NOTE — Progress Notes (Signed)
Daily Session Note  Patient Details  Name: Shane [name] MRN: [id number] Date of Birth: 11/24/1941 Referring Provider:     Pulmonary Rehab Walk Test from 03/30/2017 in [facility]  Referring Provider  Dr. Ramaswamy      Encounter Date: 07/25/2017  Check In:     Session Check In - 07/25/17 1206      Check-In   Location [facility]   Staff Present Molly diVincenzo, MS, ACSM RCEP, Exercise Physiologist;Lisa Hughes, RN;Other;Portia Payne, RN, BSN   Supervising physician immediately available to respond to emergencies [facility] immediately available   Physician(s) Dr. Vega   Medication changes reported     No   Fall or balance concerns reported    No   Tobacco Cessation No Change   Warm-up and Cool-down Performed as group-led instruction   Resistance Training Performed Yes   VAD Patient? No     Pain Assessment   Currently in Pain? No/denies   Multiple Pain Sites No      Capillary Blood Glucose: No results found for this or any previous visit (from the past 24 hour(s)).     POCT Glucose - 07/25/17 1215      POCT Blood Glucose   Post-Exercise 110 mg/dL         Exercise Prescription Changes - 07/25/17 1200      Response to Exercise   Blood Pressure (Admit) 142/60   Blood Pressure (Exercise) 158/66   Blood Pressure (Exit) 108/60   Heart Rate (Admit) 57 bpm   Heart Rate (Exercise) 84 bpm   Heart Rate (Exit) 67 bpm   Oxygen Saturation (Admit) 99 %   Oxygen Saturation (Exercise) 95 %   Oxygen Saturation (Exit) 98 %   Rating of Perceived Exertion (Exercise) 12   Perceived Dyspnea (Exercise) 1   Duration Continue with 45 min of aerobic exercise without signs/symptoms of physical distress.   Intensity THRR unchanged     Progression   Progression Continue to progress workloads to maintain intensity without signs/symptoms of physical distress.     Resistance Training   Training Prescription Yes   Weight blue  bands   Reps 10-15   Time 10 Minutes     Interval Training   Interval Training No     Bike   Level 1.4   Minutes 17     Stairs   Minutes 17     Track   Laps 15   Minutes 17      History  Smoking Status  . Never Smoker  Smokeless Tobacco  . Never Used    Goals Met:  Exercise tolerated well No report of cardiac concerns or symptoms Strength training completed today  Goals Unmet:  Not Applicable  Comments: Service time is from 10:30a to 12:10p    Dr. Wesam G. Yacoub is Medical Director for Pulmonary Rehab at [facility]. 

## 2017-07-25 NOTE — Telephone Encounter (Signed)
Yes, this was faxed back yesterday. 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  Will await decision.

## 2017-07-26 ENCOUNTER — Ambulatory Visit: Payer: Medicare Other | Admitting: Internal Medicine

## 2017-07-27 ENCOUNTER — Encounter (HOSPITAL_COMMUNITY): Payer: Medicare Other

## 2017-07-27 ENCOUNTER — Encounter (HOSPITAL_COMMUNITY)
Admission: RE | Admit: 2017-07-27 | Discharge: 2017-07-27 | Disposition: A | Discharge status: Home / Self Care | Payer: Medicare Other | Source: Ambulatory Visit | Attending: Internal Medicine | Admitting: Internal Medicine

## 2017-07-27 VITALS — Wt 265.2 lb

## 2017-07-27 DIAGNOSIS — J84112 Idiopathic pulmonary fibrosis: Secondary | ICD-10-CM

## 2017-07-27 NOTE — Progress Notes (Signed)
Daily Session Note  Patient Details  Name: Shane [name] MRN: [id number] Date of Birth: May 03, 1941 Referring Provider:     Pulmonary Rehab Walk Test from 03/30/2017 in [place]  Referring Provider  Dr. Chase Caller      Encounter Date: 07/27/2017  Check In:     Session Check In - 07/27/17 1023      Check-In   Location [facility]   Staff Present Trish Fountain, RN, BSN;Lisa Ysidro Evert, RN;Molly diVincenzo, MS, ACSM RCEP, Exercise Physiologist   Supervising physician immediately available to respond to emergencies [facility] immediately available   Physician(s) Dr. Clementeen [place]   Medication changes reported     No   Fall or balance concerns reported    No   Tobacco Cessation No Change   Warm-up and Cool-down Performed as group-led instruction   Resistance Training Performed Yes   VAD Patient? No     Pain Assessment   Currently in Pain? No/denies   Multiple Pain Sites No      Capillary Blood Glucose: No results found for this or any previous visit (from the past 24 hour(s)).      Exercise Prescription Changes - 07/27/17 1200      Response to Exercise   Blood Pressure (Admit) 150/64   Blood Pressure (Exercise) 140/60   Blood Pressure (Exit) 118/64   Heart Rate (Admit) 63 bpm   Heart Rate (Exercise) 81 bpm   Heart Rate (Exit) 70 bpm   Oxygen Saturation (Admit) 99 %   Oxygen Saturation (Exercise) 94 %   Oxygen Saturation (Exit) 98 %   Rating of Perceived Exertion (Exercise) 13   Perceived Dyspnea (Exercise) 1   Duration Continue with 45 min of aerobic exercise without signs/symptoms of physical distress.   Intensity THRR unchanged     Progression   Progression Continue to progress workloads to maintain intensity without signs/symptoms of physical distress.     Resistance Training   Training Prescription Yes   Weight blue bands   Reps 10-15   Time 10 Minutes     Interval Training   Interval Training No     Stairs    Minutes 17     Track   Laps 17   Minutes 17      History  Smoking Status  . Never Smoker  Smokeless Tobacco  . Never Used    Goals Met:  Exercise tolerated well No report of cardiac concerns or symptoms Strength training completed today  Goals Unmet:  Not Applicable  Comments: Service time is from 1030 to 1230    Dr. Rush Farmer is Medical Director for Pulmonary Rehab at [facility].

## 2017-07-28 NOTE — Telephone Encounter (Signed)
Called Melissa with [facility], verified that Dickey Gave has been approved from 07/14/2017-12/04/2017.   Stuart, verified that rx was shipped on 07/26/17, pt received on 07/27/2017 (had to sign for rx).    Nothing further needed.

## 2017-08-01 ENCOUNTER — Encounter (HOSPITAL_COMMUNITY)
Admission: RE | Admit: 2017-08-01 | Discharge: 2017-08-01 | Disposition: A | Discharge status: Home / Self Care | Payer: Medicare Other | Source: Ambulatory Visit | Attending: Internal Medicine | Admitting: Internal Medicine

## 2017-08-01 ENCOUNTER — Encounter (HOSPITAL_COMMUNITY): Payer: Medicare Other

## 2017-08-01 VITALS — Wt 259.3 lb

## 2017-08-01 DIAGNOSIS — J84112 Idiopathic pulmonary fibrosis: Secondary | ICD-10-CM

## 2017-08-01 NOTE — Progress Notes (Signed)
I have reviewed a Home Exercise Prescription with Shane [name] . [name] is currently exercising at home.  The patient was advised to walk 2-3 days a week for 40 minutes. Also, 30 minutes on the recumbent bike 2-3 days a week.  [name] and I discussed how to progress their exercise prescription.  The patient stated that their goals were to improve balance and increase walking distance.  The patient stated that they understand the exercise prescription.  We reviewed exercise guidelines, target heart rate during exercise, oxygen use, weather, home pulse oximeter, endpoints for exercise, and goals.  Patient is encouraged to come to me with any questions. I will continue to follow up with the patient to assist them with progression and safety.

## 2017-08-01 NOTE — Progress Notes (Signed)
Daily Session Note  Patient Details  Name: SIGFREDO SCHREIER MRN: [id number] Date of Birth: 1941-01-03 Referring Provider:     Pulmonary Rehab Walk Test from 03/30/2017 in [place]  Referring Provider  Dr. Chase Caller      Encounter Date: 08/01/2017  Check In:     Session Check In - 08/01/17 1030      Check-In   Location [facility]   Staff Present Other;Ramon Dredge, RN, MHA;Portia Rollene [place], RN, BSN;Molly diVincenzo, MS, ACSM RCEP, Exercise Physiologist   Supervising physician immediately available to respond to emergencies [facility] immediately available   Physician(s) Dr. Clementeen [place]   Medication changes reported     No   Fall or balance concerns reported    No   Tobacco Cessation No Change   Warm-up and Cool-down Performed as group-led instruction   Resistance Training Performed Yes   VAD Patient? No     VAD patient   Has back up controller? No     Pain Assessment   Currently in Pain? No/denies   Multiple Pain Sites No      Capillary Blood Glucose: No results found for this or any previous visit (from the past 24 hour(s)).     POCT Glucose - 08/01/17 1206      POCT Blood Glucose   Pre-Exercise 144 mg/dL   Post-Exercise 105 mg/dL  95         Exercise Prescription Changes - 08/01/17 1200      Response to Exercise   Blood Pressure (Admit) 122/62   Blood Pressure (Exercise) 142/68   Blood Pressure (Exit) 118/64   Heart Rate (Admit) 70 bpm   Heart Rate (Exercise) 82 bpm   Heart Rate (Exit) 70 bpm   Oxygen Saturation (Admit) 97 %   Oxygen Saturation (Exercise) 92 %   Oxygen Saturation (Exit) 96 %   Rating of Perceived Exertion (Exercise) 12   Perceived Dyspnea (Exercise) 1   Duration Continue with 45 min of aerobic exercise without signs/symptoms of physical distress.   Intensity THRR unchanged     Progression   Progression Continue to progress workloads to maintain intensity without signs/symptoms  of physical distress.     Resistance Training   Training Prescription Yes   Weight blue bands   Reps 10-15   Time 10 Minutes     Interval Training   Interval Training No     Bike   Level 1.4   Minutes 17     Stairs   Minutes 17     Track   Laps 15   Minutes 17      History  Smoking Status  . Never Smoker  Smokeless Tobacco  . Never Used    Goals Met:  Exercise tolerated well No report of cardiac concerns or symptoms Strength training completed today  Goals Unmet:  Not Applicable  Comments: Service time is from 10:30a to 12:10p    Dr. Rush Farmer is Medical Director for Pulmonary Rehab at [facility]

## 2017-08-02 ENCOUNTER — Encounter (HOSPITAL_COMMUNITY): Payer: Self-pay

## 2017-08-02 DIAGNOSIS — J84112 Idiopathic pulmonary fibrosis: Secondary | ICD-10-CM

## 2017-08-02 NOTE — Progress Notes (Signed)
Pulmonary Individual Treatment Plan  Patient Details  Name: Shane [name] MRN: [id number] Date of Birth: 02/17/1941 Referring Provider:     Pulmonary Rehab Walk Test from 03/30/2017 in [place]  Referring Provider  Dr. Chase Caller      Initial Encounter Date:    Pulmonary Rehab Walk Test from 03/30/2017 in [place]  Date  03/31/17  Referring Provider  Dr. Chase Caller      Visit Diagnosis: 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[place])  Patient's Home Medications on Admission:   Current Outpatient Prescriptions:  .  Ascorbic Acid (VITAMIN C) 1000 MG tablet, Take 1,000 mg by mouth daily., Disp: , Rfl:  .  aspirin 81 MG tablet, Take 81 mg by mouth daily.  , Disp: , Rfl:  .  clotrimazole-betamethasone (LOTRISONE) cream, APPLY TO AFFECTED AREA TWICE A DAY, Disp: , Rfl: 0 .  diazepam (VALIUM) 5 MG tablet, Take 5 mg by mouth at bedtime as needed (for sleep). , Disp: , Rfl:  .  furosemide (LASIX) 40 MG tablet, Take 40 mg by mouth daily. , Disp: , Rfl:  .  insulin detemir (LEVEMIR) 100 UNIT/ML injection, Inject 22 Units into the skin at bedtime., Disp: , Rfl:  .  insulin lispro (HUMALOG KWIKPEN) 100 UNIT/ML injection, Inject 20 Units into the skin 3 (three) times daily before meals. Sliding scale, Disp: , Rfl:  .  Insulin Pump Disposable (V-GO 40) KIT, 40 Units by Does not apply route daily. humalog 100 units dispensed every 24 hours. Dispose v-go bag after 24 hours of use. , Disp: , Rfl:  .  KLOR-CON M20 20 MEQ tablet, Take 20 mEq by mouth daily. , Disp: , Rfl:  .  lisinopril (PRINIVIL,ZESTRIL) 20 MG tablet, Take 20 mg by mouth daily. , Disp: , Rfl:  .  Multiple Vitamin (MULTIVITAMIN) tablet, Take 1 tablet by mouth daily.  , Disp: , Rfl:  .  nebivolol (BYSTOLIC) 5 MG tablet, Take 5 mg by mouth daily., Disp: , Rfl:  .  nitroGLYCERIN (NITROSTAT) 0.4 MG SL tablet, Place 0.4 mg under the tongue every 5 (five) minutes as needed. For chest  pain, Disp: , Rfl:  .  pantoprazole (PROTONIX) 40 MG tablet, Take 40 mg by mouth daily.  , Disp: , Rfl:  .  PROAIR HFA 108 (90 Base) MCG/ACT inhaler, Inhale 2 puffs into the lungs every 4 (four) hours as needed for wheezing or shortness of breath. , Disp: , Rfl:  .  promethazine-phenylephrine (PROMETHAZINE VC PLAIN) 6.25-5 MG/5ML SYRP, Take 5 mLs by mouth every 4 (four) hours as needed for congestion., Disp: , Rfl:   Past Medical History: Past Medical History:  Diagnosis Date  . 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 Cataract   . Chest pain   . Chronic kidney disease    d/t DM  . Diabetes mellitus    Vgo disposal insulin bolus  simular to insulin pump  . Dyspnea   . GERD (gastroesophageal reflux disease)   . Hyperlipidemia   . Hypertension   . Neuromuscular disorder ([place])   . PONV (postoperative nausea and vomiting)   . Sleep apnea     uses cpap asked to bring mask and tubing    Tobacco Use: History  Smoking Status  . Never Smoker  Smokeless Tobacco  . Never Used    Labs: Recent Review Flowsheet Data    Labs for ITP Cardiac and Pulmonary Rehab Latest Ref Rng & Units 11/09/2016  11/09/2016 12/19/2016 12/26/2016 12/27/2016   Cholestrol 0 - 200 mg/dL - - - - -   LDLCALC 0 - 99 mg/dL - - - - -   HDL >39 mg/dL - - - - -   Trlycerides <150 mg/dL - - - - -   Hemoglobin A1c 4.8 - 5.6 % - - 7.9(H) - -   PHART 7.350 - 7.450 - - TEST WILL BE CREDITED 7.376 7.362   PCO2ART 32.0 - 48.0 mmHg - - TEST WILL BE CREDITED 44.0 45.4   HCO3 20.0 - 28.0 mmol/L 28.0 30.0(H) TEST WILL BE CREDITED 25.2 25.1   TCO2 0 - 100 mmol/L 29 31 - - -   ACIDBASEDEF 0.0 - 2.0 mmol/L - - TEST WILL BE CREDITED - -   O2SAT % 59.0 61.0 TEST WILL BE CREDITED 93.9 95.7      Capillary Blood Glucose: Lab Results  Component Value Date   GLUCAP 191 (H) 12/29/2016   GLUCAP 243 (H) 12/28/2016   GLUCAP 233 (H) 12/28/2016   GLUCAP 238 (H) 12/28/2016   GLUCAP 270 (H) 12/28/2016       POCT Glucose    Row Name 06/08/17 1226  06/13/17 1237 06/15/17 1224 06/20/17 1207 06/22/17 1251     POCT Blood Glucose   Pre-Exercise 205 mg/dL 190 mg/dL 230 mg/dL 161 mg/dL 119 mg/dL   Post-Exercise 149 mg/dL 117 mg/dL 168 mg/dL 102 mg/dL  ate peanut butter crackers post exercise 122 mg/dL   Row Name 06/27/17 1200 07/04/17 1300 07/06/17 1218 07/11/17 1218 07/13/17 1301     POCT Blood Glucose   Pre-Exercise 197 mg/dL 150 mg/dL 164 mg/dL 224 mg/dL 170 mg/dL   Post-Exercise 94 mg/dL 123 mg/dL 129 mg/dL 95 mg/dL 125 mg/dL   Row Name 07/18/17 1241 07/20/17 1223 07/25/17 1215 07/27/17 1240 08/01/17 1206     POCT Blood Glucose   Pre-Exercise 235 mg/dL 119 mg/dL  - 184 mg/dL 144 mg/dL   Post-Exercise 92 mg/dL 127 mg/dL 110 mg/dL 146 mg/dL 105 mg/dL  95      Pulmonary Assessment Scores:   Pulmonary Function Assessment:   Exercise Target Goals:    Exercise Program Goal: Individual exercise prescription set with THRR, safety & activity barriers. Participant demonstrates ability to understand and report RPE using BORG scale, to self-measure pulse accurately, and to acknowledge the importance of the exercise prescription.  Exercise Prescription Goal: Starting with aerobic activity 30 plus minutes a day, 3 days per week for initial exercise prescription. Provide home exercise prescription and guidelines that participant acknowledges understanding prior to discharge.  Activity Barriers & Risk Stratification:   6 Minute Walk:   Oxygen Initial Assessment:   Oxygen Re-Evaluation:     Oxygen Re-Evaluation    Row Name 06/05/17 1532 07/04/17 1319 08/02/17 1737         Program Oxygen Prescription   Program Oxygen Prescription None None None       Home Oxygen   Home Oxygen Device None None None     Sleep Oxygen Prescription  - None None     Home Exercise Oxygen Prescription  - None None     Home at Rest Exercise Oxygen Prescription  - None None        Oxygen Discharge (Final Oxygen Re-Evaluation):     Oxygen  Re-Evaluation - 08/02/17 1737      Program Oxygen Prescription   Program Oxygen Prescription None     Home Oxygen   Home Oxygen Device None   Sleep  Oxygen Prescription None   Home Exercise Oxygen Prescription None   Home at Rest Exercise Oxygen Prescription None      Initial Exercise Prescription:   Perform Capillary Blood Glucose checks as needed.  Exercise Prescription Changes:     Exercise Prescription Changes    Row Name 06/06/17 1200 06/08/17 1221 06/13/17 1200 06/15/17 1200 06/20/17 1200     Response to Exercise   Blood Pressure (Admit) 104/56 124/70 110/60 104/54 120/52   Blood Pressure (Exercise) 110/54 140/60 120/70 120/70 130/68   Blood Pressure (Exit) 98/60 94/40 106/54 110/66 120/62   Heart Rate (Admit) 75 bpm 66 bpm 55 bpm 68 bpm 60 bpm   Heart Rate (Exercise) 84 bpm 83 bpm 87 bpm 78 bpm 75 bpm   Heart Rate (Exit) 74 bpm 74 bpm 87 bpm 69 bpm 67 bpm   Oxygen Saturation (Admit) 97 % 99 % 99 % 98 % 97 %   Oxygen Saturation (Exercise) 94 % 93 % 92 % 98 % 996 %   Oxygen Saturation (Exit) 97 % 98 % 98 % 98 % 98 %   Rating of Perceived Exertion (Exercise) _0 Perceived Dyspnea (Exercise) _1 0 0   Duration Progress to 45 minutes of aerobic exercise without signs/symptoms of physical distress Progress to 45 minutes of aerobic exercise without signs/symptoms of physical distress Progress to 45 minutes of aerobic exercise without signs/symptoms of physical distress Continue with 45 min of aerobic exercise without signs/symptoms of physical distress. Continue with 45 min of aerobic exercise without signs/symptoms of physical distress.   Intensity _2      Progression   Progression Continue to progress workloads to maintain intensity without signs/symptoms of physical distress. Continue to progress workloads to maintain intensity without signs/symptoms of physical distress. Continue to  progress workloads to maintain intensity without signs/symptoms of physical distress. Continue to progress workloads to maintain intensity without signs/symptoms of physical distress. Continue to progress workloads to maintain intensity without signs/symptoms of physical distress.     Resistance Training   Training Prescription _3    Weight _4    Reps 10-15 10-15 10-15 10-15 10-15   Time 10 Minutes 10 Minutes 10 Minutes 10 Minutes 10 Minutes     Interval Training   Interval Training _5      Bike   Level 0._6 1.2   Minutes _7 NuStep   Level 6  - 6 6  -   Minutes 17  - 17 17  -   METs 2.6  - 2.7 2.4  -     Stairs   Minutes _8 - 17     Track   Laps  -  -  -  - 11   Minutes  -  -  -  - 17   Row Name 06/22/17 1200 06/27/17 1100 07/04/17 1300 07/06/17 1200 07/11/17 1200     Response to Exercise   Blood Pressure (Admit) 114/54 104/54 100/50 110/50 114/60   Blood Pressure (Exercise) 144/70 134/62 124/62 144/70 114/52   Blood Pressure (Exit) 120/54 110/62 108/72 92/40 100/50   Heart Rate (Admit) 59 bpm 70 bpm 71 bpm 64 bpm 73 bpm   Heart Rate (Exercise) 79 bpm 83 bpm 87 bpm 86 bpm 90 bpm  Heart Rate (Exit) 67 bpm 76 bpm 76 bpm 71 bpm 72 bpm   Oxygen Saturation (Admit) 98 % 97 % 99 % 99 % 98 %   Oxygen Saturation (Exercise) 94 % 93 % 92 % 96 % 93 %   Oxygen Saturation (Exit) 99 % 94 % 97 % 99 % 98 %   Rating of Perceived Exertion (Exercise) _0 Perceived Dyspnea (Exercise) _1 Duration Continue with 45 min of aerobic exercise without signs/symptoms of physical distress. Continue with 45 min of aerobic exercise without signs/symptoms of physical distress. Continue with 45 min of aerobic exercise without signs/symptoms of physical distress. Continue with 45 min of aerobic exercise without signs/symptoms of physical distress. Continue with 45 min of aerobic  exercise without signs/symptoms of physical distress.   Intensity _2      Progression   Progression Continue to progress workloads to maintain intensity without signs/symptoms of physical distress. Continue to progress workloads to maintain intensity without signs/symptoms of physical distress. Continue to progress workloads to maintain intensity without signs/symptoms of physical distress. Continue to progress workloads to maintain intensity without signs/symptoms of physical distress. Continue to progress workloads to maintain intensity without signs/symptoms of physical distress.     Resistance Training   Training Prescription _3    Weight _4    Reps 10-15 10-15 10-15 10-15 10-15   Time 10 Minutes 10 Minutes 10 Minutes 10 Minutes 10 Minutes     Interval Training   Interval Training _5      Bike   Level  - 1.2 1.2 1.2 1.2   Minutes  - _6 Stairs   Minutes _7 - 17     Track   Laps _8 Minutes _9 Row Name 07/13/17 1258 07/18/17 1200 07/20/17 1220 07/25/17 1200 07/27/17 1200     Response to Exercise   Blood Pressure (Admit) 140/52 124/56 130/52 142/60 150/64   Blood Pressure (Exercise)  - 148/66 120/62 158/66 140/60   Blood Pressure (Exit) 94/50 100/44 98/56 108/60 118/64   Heart Rate (Admit) 66 bpm 61 bpm 62 bpm 57 bpm 63 bpm   Heart Rate (Exercise) 79 bpm 85 bpm 83 bpm 84 bpm 81 bpm   Heart Rate (Exit) 70 bpm 70 bpm 73 bpm 67 bpm 70 bpm   Oxygen Saturation (Admit) 97 % 97 % 98 % 99 % 99 %   Oxygen Saturation (Exercise) 95 % 93 % 89 % 95 % 94 %   Oxygen Saturation (Exit) 98 % 97 % 95 % 98 % 98 %   Rating of Perceived Exertion (Exercise) _10 Perceived Dyspnea (Exercise) 0 _11 Duration Continue with 45 min of aerobic exercise without signs/symptoms of physical distress.  Continue with 45 min of aerobic exercise without signs/symptoms of physical distress. Continue with 45 min of aerobic exercise without signs/symptoms of physical distress. Continue with 45 min of aerobic exercise without signs/symptoms of physical distress. Continue with 45 min of aerobic exercise without signs/symptoms of physical distress.   Intensity _12      Progression   Progression Continue to progress workloads  to maintain intensity without signs/symptoms of physical distress. Continue to progress workloads to maintain intensity without signs/symptoms of physical distress. Continue to progress workloads to maintain intensity without signs/symptoms of physical distress. Continue to progress workloads to maintain intensity without signs/symptoms of physical distress. Continue to progress workloads to maintain intensity without signs/symptoms of physical distress.     Resistance Training   Training Prescription _0    Weight _1    Reps 10-15 10-15 10-15 10-15 10-15   Time 10 Minutes 10 Minutes 10 Minutes 10 Minutes 10 Minutes     Interval Training   Interval Training _2      Bike   Level  - 1.4 1.4 1.4  -   Minutes  - _3 -     Stairs   Minutes _4 Track   Laps _5 Minutes _6 Row Name 08/01/17 1200             Response to Exercise   Blood Pressure (Admit) 122/62       Blood Pressure (Exercise) 142/68       Blood Pressure (Exit) 118/64       Heart Rate (Admit) 70 bpm       Heart Rate (Exercise) 82 bpm       Heart Rate (Exit) 70 bpm       Oxygen Saturation (Admit) 97 %       Oxygen Saturation (Exercise) 92 %       Oxygen Saturation (Exit) 96 %       Rating of Perceived Exertion (Exercise) 12       Perceived Dyspnea (Exercise) 1       Duration Continue with 45 min of aerobic  exercise without signs/symptoms of physical distress.       Intensity THRR unchanged         Progression   Progression Continue to progress workloads to maintain intensity without signs/symptoms of physical distress.         Resistance Training   Training Prescription Yes       Weight blue bands       Reps 10-15       Time 10 Minutes         Interval Training   Interval Training No         Bike   Level 1.4       Minutes 17         Stairs   Minutes 17         Track   Laps 15       [place] to continue exercise at Home (comment)       Frequency Add 3 additional days to program exercise sessions.          Exercise Comments:     Exercise Comments    Row Name 08/01/17 1242           Exercise Comments home exercise completed          Exercise Goals and Review:   Exercise Goals Re-Evaluation :     Exercise Goals Re-Evaluation    Row Name 06/06/17 1312 06/30/17 1533 08/01/17 0828         Exercise Goal Re-Evaluation  Exercise Goals Review Increase Physical Activity;Increase Strenth and Stamina Increase Strenth and Stamina;Increase Physical Activity Increase Physical Activity;Increase Strength and Stamina;Able to understand and use Dyspnea scale;Able to understand and use rate of perceived exertion (RPE) scale;Knowledge and understanding of Target Heart Rate Range (THRR);Understanding of Exercise Prescription     Comments Patient is progressing well in rehab. He is open and motivated to work harder when directed. He averages 2.4-2.6 METS. Stability and balance are an issue--that is why he is not walking on the track.  Patient is progressing well in rehab. He is open and motivated to work harder when directed. He averages 2.4-2.6 METS. Stability and balance are an issue but improving per patient. He is now walking the track using a walker. Will cont. to monitor and progress as able.  Patient is progressing well in rehab. He is open  and motivated to work harder when directed. He averages 2.4-2.6 METS. Stability and balance are an issue but improving per patient. He is able to walk up to 16 laps (200 ft each) in 15 minutes. Will cont. to monitor and progress as able.      Expected Outcomes Through exercise at rehab and at home patient will be able to increase physical activity, strength, and stamina. Through exercise at rehab and at home patient will be able to increase physical activity, strength, and stamina. Through exercise at rehab and at home patient will be able to increase physical activity, strength, and stamina.        Discharge Exercise Prescription (Final Exercise Prescription Changes):     Exercise Prescription Changes - 08/01/17 1200      Response to Exercise   Blood Pressure (Admit) 122/62   Blood Pressure (Exercise) 142/68   Blood Pressure (Exit) 118/64   Heart Rate (Admit) 70 bpm   Heart Rate (Exercise) 82 bpm   Heart Rate (Exit) 70 bpm   Oxygen Saturation (Admit) 97 %   Oxygen Saturation (Exercise) 92 %   Oxygen Saturation (Exit) 96 %   Rating of Perceived Exertion (Exercise) 12   Perceived Dyspnea (Exercise) 1   Duration Continue with 45 min of aerobic exercise without signs/symptoms of physical distress.   Intensity THRR unchanged     Progression   Progression Continue to progress workloads to maintain intensity without signs/symptoms of physical distress.     Resistance Training   Training Prescription Yes   Weight blue bands   Reps 10-15   Time 10 Minutes     Interval Training   Interval Training No     Bike   Level 1.4   Minutes 17     Stairs   Minutes 17     Track   Laps 15   [place] to continue exercise at Home (comment)   Frequency Add 3 additional days to program exercise sessions.      Nutrition:  Target Goals: Understanding of nutrition guidelines, daily intake of sodium '1500mg'$ , cholesterol '200mg'$ , calories 30% from fat and 7%  or less from saturated fats, daily to have 5 or more servings of fruits and vegetables.  Biometrics:    Nutrition Therapy Plan and Nutrition Goals:   Nutrition Discharge: Rate Your Plate Scores:   Nutrition Goals Re-Evaluation:   Nutrition Goals Discharge (Final Nutrition Goals Re-Evaluation):   Psychosocial: Target Goals: Acknowledge presence or absence of significant depression and/or stress, maximize coping skills, provide positive support system. Participant is able to verbalize types and  ability to use techniques and skills needed for reducing stress and depression.  Initial Review & Psychosocial Screening:   Quality of Life Scores:   PHQ-9: Recent Review Flowsheet Data    Depression screen [facility] 2/9 03/27/2017   Decreased Interest 0   Down, Depressed, Hopeless 0   PHQ - 2 Score 0     Interpretation of Total Score  Total Score Depression Severity:  1-4 = Minimal depression, 5-9 = Mild depression, 10-14 = Moderate depression, 15-19 = Moderately severe depression, 20-27 = Severe depression   Psychosocial Evaluation and Intervention:   Psychosocial Re-Evaluation:     Psychosocial Re-Evaluation    [place] Name 06/05/17 1540 07/04/17 1326 08/02/17 1739         Psychosocial Re-Evaluation   Current issues with None Identified None Identified None Identified     Expected Outcomes patient will remain free of psychosocial barrriers  patient will remain free of psychosocial barrriers  patient will remain free of psychosocial barrriers      Interventions Encouraged to attend Pulmonary Rehabilitation for the exercise Encouraged to attend Pulmonary Rehabilitation for the exercise Encouraged to attend Pulmonary Rehabilitation for the exercise     Continue Psychosocial Services  No Follow up required No Follow up required No Follow up required        Psychosocial Discharge (Final Psychosocial Re-Evaluation):     Psychosocial Re-Evaluation - 08/02/17 1739      Psychosocial  Re-Evaluation   Current issues with None Identified   Expected Outcomes patient will remain free of psychosocial barrriers    Interventions Encouraged to attend Pulmonary Rehabilitation for the exercise   Continue Psychosocial Services  No Follow up required      Education: Education Goals: Education classes will be provided on a weekly basis, covering required topics. Participant will state understanding/return demonstration of topics presented.  Learning Barriers/Preferences:   Education Topics: Risk Factor Reduction:  -Group instruction that is supported by a PowerPoint presentation. Instructor discusses the definition of a risk factor, different risk factors for pulmonary disease, and how the heart and lungs work together.     Nutrition for Pulmonary Patient:  -Group instruction provided by PowerPoint slides, verbal discussion, and written materials to support subject matter. The instructor gives an explanation and review of healthy diet recommendations, which includes a discussion on weight management, recommendations for fruit and vegetable consumption, as well as protein, fluid, caffeine, fiber, sodium, sugar, and alcohol. Tips for eating when patients are short of breath are discussed.   PULMONARY REHAB OTHER RESPIRATORY from 07/27/2017 in [place]  Date  07/27/17  Educator  RD  Instruction Review Code  2- meets goals/outcomes      Pursed Lip Breathing:  -Group instruction that is supported by demonstration and informational handouts. Instructor discusses the benefits of pursed lip and diaphragmatic breathing and detailed demonstration on how to preform both.     Oxygen Safety:  -Group instruction provided by PowerPoint, verbal discussion, and written material to support subject matter. There is an overview of "What is Oxygen" and "Why do we need it".  Instructor also reviews how to create a safe environment for oxygen use, the importance of  using oxygen as prescribed, and the risks of noncompliance. There is a brief discussion on traveling with oxygen and resources the patient may utilize.   PULMONARY REHAB OTHER RESPIRATORY from 07/27/2017 in [place]  Date  07/13/17  Educator  Truddie Crumble  Instruction Review Code  R- Review/reinforce      Oxygen Equipment:  -Group instruction provided by [facility] Staff utilizing handouts, written materials, and equipment demonstrations.   PULMONARY REHAB OTHER RESPIRATORY from 07/27/2017 in [place]  Date  04/27/17  Educator  George[facility]  Instruction Review Code  2- meets goals/outcomes      Signs and Symptoms:  -Group instruction provided by written material and verbal discussion to support subject matter. Warning signs and symptoms of infection, stroke, and heart attack are reviewed and when to call the physician/911 reinforced. Tips for preventing the spread of infection discussed.   PULMONARY REHAB OTHER RESPIRATORY from 07/27/2017 in [place]  Date  06/15/17  Educator  rn  Instruction Review Code  2- meets goals/outcomes      Advanced Directives:  -Group instruction provided by verbal instruction and written material to support subject matter. Instructor reviews Advanced Directive laws and proper instruction for filling out document.   Pulmonary Video:  -Group video education that reviews the importance of medication and oxygen compliance, exercise, good nutrition, pulmonary hygiene, and pursed lip and diaphragmatic breathing for the pulmonary patient.   Exercise for the Pulmonary Patient:  -Group instruction that is supported by a PowerPoint presentation. Instructor discusses benefits of exercise, core components of exercise, frequency, duration, and intensity of an exercise routine, importance of utilizing pulse oximetry during exercise, safety while exercising, and options of  places to exercise outside of rehab.     PULMONARY REHAB OTHER RESPIRATORY from 07/27/2017 in [place]  Date  05/25/17  Educator  ep  Instruction Review Code  2- meets goals/outcomes      Pulmonary Medications:  -Verbally interactive group education provided by instructor with focus on inhaled medications and proper administration.   PULMONARY REHAB OTHER RESPIRATORY from 07/27/2017 in [place]  Date  04/20/17  Educator  Pharm  Instruction Review Code  2- meets goals/outcomes      Anatomy and Physiology of the Respiratory System and Intimacy:  -Group instruction provided by PowerPoint, verbal discussion, and written material to support subject matter. Instructor reviews respiratory cycle and anatomical components of the respiratory system and their functions. Instructor also reviews differences in obstructive and restrictive respiratory diseases with examples of each. Intimacy, Sex, and Sexuality differences are reviewed with a discussion on how relationships can change when diagnosed with pulmonary disease. Common sexual concerns are reviewed.   PULMONARY REHAB OTHER RESPIRATORY from 07/27/2017 in [place]  Date  06/22/17  Educator  RN  Instruction Review Code  2- meets goals/outcomes      MD DAY -A group question and answer session with a medical doctor that allows participants to ask questions that relate to their pulmonary disease state.   PULMONARY REHAB OTHER RESPIRATORY from 07/27/2017 in [place]  Date  06/08/17  Educator  Nelda Marseille  Instruction Review Code  2- meets goals/outcomes      OTHER EDUCATION -Group or individual verbal, written, or video instructions that support the educational goals of the pulmonary rehab program.   Knowledge Questionnaire Score:   Core Components/Risk Factors/Patient Goals at Admission:   Core  Components/Risk Factors/Patient Goals Review:      Goals and Risk Factor Review    Row Name 06/05/17 1533 07/04/17 1320 08/02/17 1737         Core Components/Risk Factors/Patient Goals Review   Personal Goals Review  Weight Management/Obesity;Diabetes;Develop more efficient breathing techniques such as purse lipped breathing and diaphragmatic breathing and practicing self-pacing with activity.;Improve shortness of breath with ADL's Weight Management/Obesity;Diabetes;Develop more efficient breathing techniques such as purse lipped breathing and diaphragmatic breathing and practicing self-pacing with activity.;Improve shortness of breath with ADL's Weight Management/Obesity;Diabetes;Develop more efficient breathing techniques such as purse lipped breathing and diaphragmatic breathing and practicing self-pacing with activity.;Improve shortness of breath with ADL's     Review Sasha is maintaining his slow weight loss trend. He was admitted with a max weight of 121.4 and his current weight is 118.6. 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 He self paces in order to maximixe the ammount of time he is able to exercise without restbreaks and verbalize a decrease in his shortness of breath with exertion. Clayson continues to do well in pulmonary rehab. His rehab sessions have been extended to 36 because he feels he is just beginning to make tremendous strides in rehab. He is able to walk the track with a walker, something he says he has not done in some time. His self confidence as it relates to his ability to be active has significantly improved. He continues to pace himself to extend the time between restbreaks. He has a better understanding of how exercise also lowers his bloodsugars and has a better understanding of why good nutrition allows him to exercise without having significantly low  CBGs.  Patient continues to improve and progress towards all rehab goals at every session. He will graduate within the next 30 days and is already in the process of joining a gym. he is extremely proud of the progress he has made during his pulmonary rehab admission.     Expected Outcomes see Admission expected outcomes see Admission expected outcomes see Admission expected outcomes        Core Components/Risk Factors/Patient Goals at Discharge (Final Review):      Goals and Risk Factor Review - 08/02/17 1737      Core Components/Risk Factors/Patient Goals Review   Personal Goals Review Weight Management/Obesity;Diabetes;Develop more efficient breathing techniques such as purse lipped breathing and diaphragmatic breathing and practicing self-pacing with activity.;Improve shortness of breath with ADL's   Review Patient continues to improve and progress towards all rehab goals at every session. He will graduate within the next 30 days and is already in the process of joining a gym. he is extremely proud of the progress he has made during his pulmonary rehab admission.   Expected Outcomes see Admission expected outcomes      ITP Comments:   Comments: ITP REVIEW Pt is making expected progress toward pulmonary rehab goals after completing 32 sessions. Recommend continued exercise, life style modification, education, and utilization of breathing techniques to increase stamina and strength and decrease shortness of breath with exertion.

## 2017-08-03 ENCOUNTER — Encounter (HOSPITAL_COMMUNITY)
Admission: RE | Admit: 2017-08-03 | Discharge: 2017-08-03 | Disposition: A | Discharge status: Home / Self Care | Payer: Medicare Other | Source: Ambulatory Visit | Attending: Internal Medicine | Admitting: Internal Medicine

## 2017-08-03 VITALS — Wt 261.0 lb

## 2017-08-03 DIAGNOSIS — J84112 Idiopathic pulmonary fibrosis: Secondary | ICD-10-CM | POA: Diagnosis not present

## 2017-08-03 NOTE — Progress Notes (Signed)
Daily Session Note  Patient Details  Name: ITZAE MIRALLES MRN: [id number] Date of Birth: 12-25-1940 Referring Provider:     Pulmonary Rehab Walk Test from 03/30/2017 in [place]  Referring Provider  Dr. Chase Caller      Encounter Date: 08/03/2017  Check In:     Session Check In - 08/03/17 1030      Check-In   Location [facility]   Staff Present Ramon Dredge, RN, MHA;Lisa Ysidro Evert, RN;Portia Rollene Rotunda, RN, BSN   Supervising physician immediately available to respond to emergencies [facility] immediately available   Physician(s) Dr. Justus Memory   Medication changes reported     No   Fall or balance concerns reported    No   Tobacco Cessation No Change   Warm-up and Cool-down Performed as group-led instruction   Resistance Training Performed Yes   VAD Patient? No     VAD patient   Has back up controller? No     Pain Assessment   Currently in Pain? No/denies   Multiple Pain Sites No      Capillary Blood Glucose: No results found for this or any previous visit (from the past 24 hour(s)).      Exercise Prescription Changes - 08/03/17 1300      Response to Exercise   Blood Pressure (Admit) 126/60   Blood Pressure (Exercise) 158/66   Blood Pressure (Exit) 134/62   Heart Rate (Admit) 65 bpm   Heart Rate (Exercise) 76 bpm   Heart Rate (Exit) 69 bpm   Oxygen Saturation (Admit) 97 %   Oxygen Saturation (Exercise) 95 %   Oxygen Saturation (Exit) 98 %   Rating of Perceived Exertion (Exercise) 12   Perceived Dyspnea (Exercise) 1   Duration Continue with 45 min of aerobic exercise without signs/symptoms of physical distress.   Intensity THRR unchanged     Progression   Progression Continue to progress workloads to maintain intensity without signs/symptoms of physical distress.     Resistance Training   Training Prescription Yes   Weight blue bands   Reps 10-15   Time 10 Minutes     Interval Training   Interval  Training No     Stairs   Minutes 17     Track   Laps 12   Minutes 17      History  Smoking Status  . Never Smoker  Smokeless Tobacco  . Never Used    Goals Met:  Exercise tolerated well No report of cardiac concerns or symptoms Strength training completed today  Goals Unmet:  Not Applicable  Comments: Service time is from 1030 to 1220    Dr. Rush Farmer is Medical Director for Pulmonary Rehab at [facility].

## 2017-08-04 ENCOUNTER — Institutional Professional Consult (permissible substitution): Payer: Medicare Other | Admitting: Internal Medicine

## 2017-08-08 ENCOUNTER — Encounter (HOSPITAL_COMMUNITY)
Admission: RE | Admit: 2017-08-08 | Discharge: 2017-08-08 | Disposition: A | Discharge status: Home / Self Care | Payer: Medicare Other | Source: Ambulatory Visit | Attending: Internal Medicine | Admitting: Internal Medicine

## 2017-08-08 VITALS — Wt 260.8 lb

## 2017-08-08 DIAGNOSIS — E1122 Type 2 diabetes mellitus with diabetic chronic kidney disease: Secondary | ICD-10-CM | POA: Diagnosis not present

## 2017-08-08 DIAGNOSIS — E785 Hyperlipidemia, unspecified: Secondary | ICD-10-CM | POA: Insufficient documentation

## 2017-08-08 DIAGNOSIS — Z794 Long term (current) use of insulin: Secondary | ICD-10-CM | POA: Diagnosis not present

## 2017-08-08 DIAGNOSIS — Z7982 Long term (current) use of aspirin: Secondary | ICD-10-CM | POA: Insufficient documentation

## 2017-08-08 DIAGNOSIS — I129 Hypertensive chronic kidney disease with stage 1 through stage 4 chronic kidney disease, or unspecified chronic kidney disease: Secondary | ICD-10-CM | POA: Insufficient documentation

## 2017-08-08 DIAGNOSIS — Z79899 Other long term (current) drug therapy: Secondary | ICD-10-CM | POA: Insufficient documentation

## 2017-08-08 DIAGNOSIS — J84112 Idiopathic pulmonary fibrosis: Secondary | ICD-10-CM | POA: Diagnosis present

## 2017-08-08 DIAGNOSIS — N189 Chronic kidney disease, unspecified: Secondary | ICD-10-CM | POA: Diagnosis not present

## 2017-08-08 DIAGNOSIS — K219 Gastro-esophageal reflux disease without esophagitis: Secondary | ICD-10-CM | POA: Insufficient documentation

## 2017-08-08 DIAGNOSIS — G4730 Sleep apnea, unspecified: Secondary | ICD-10-CM | POA: Diagnosis not present

## 2017-08-08 NOTE — Progress Notes (Signed)
Daily Session Note  Patient Details  Name: Shane [name] MRN: [id number] Date of Birth: Jul 30, 1941 Referring Provider:     Pulmonary Rehab Walk Test from 03/30/2017 in [place]  Referring Provider  Dr. Chase Caller      Encounter Date: 08/08/2017  Check In:     Session Check In - 08/08/17 1214      Check-In   [facility]   Staff Present Su Hilt, MS, ACSM RCEP, Exercise Physiologist;Other;Lisa Ysidro Evert, RN;Portia Rollene Rotunda, RN, BSN   Supervising physician immediately available to respond to emergencies [facility] immediately available   Physician(s) Dr. Bonner Puna   Medication changes reported     No   Fall or balance concerns reported    No   Tobacco Cessation No Change   Warm-up and Cool-down Performed as group-led instruction   Resistance Training Performed Yes   VAD Patient? No     Pain Assessment   Currently in Pain? No/denies   Multiple Pain Sites No      Capillary Blood Glucose: No results found for this or any previous visit (from the past 24 hour(s)).     POCT Glucose - 08/08/17 1232      POCT Blood Glucose   Pre-Exercise 144 mg/dL   Post-Exercise 149 mg/dL         Exercise Prescription Changes - 08/08/17 1200      Response to Exercise   Blood Pressure (Admit) 122/62   Blood Pressure (Exercise) 128/62   Blood Pressure (Exit) 94/44   Heart Rate (Admit) 73 bpm   Heart Rate (Exercise) 86 bpm   Heart Rate (Exit) 79 bpm   Oxygen Saturation (Admit) 98 %   Oxygen Saturation (Exercise) 94 %   Oxygen Saturation (Exit) 97 %   Rating of Perceived Exertion (Exercise) 12   Perceived Dyspnea (Exercise) 1   Duration Continue with 45 min of aerobic exercise without signs/symptoms of physical distress.   Intensity THRR unchanged     Progression   Progression Continue to progress workloads to maintain intensity without signs/symptoms of physical distress.     Resistance Training   Training  Prescription Yes   Weight blue bands   Reps 10-15   Time 10 Minutes     Interval Training   Interval Training No     Bike   Level 1.4   Minutes 17     Stairs   Minutes 17     Track   Laps 15   Minutes 17      History  Smoking Status  . Never Smoker  Smokeless Tobacco  . Never Used    Goals Met:  Exercise tolerated well No report of cardiac concerns or symptoms Strength training completed today  Goals Unmet:  Not Applicable  Comments: Service time is from 1030 to 1205    Dr. Rush Farmer is Medical Director for Pulmonary Rehab at [facility].

## 2017-08-10 ENCOUNTER — Encounter (HOSPITAL_COMMUNITY)
Admission: RE | Admit: 2017-08-10 | Discharge: 2017-08-10 | Disposition: A | Discharge status: Home / Self Care | Payer: Medicare Other | Source: Ambulatory Visit | Attending: Internal Medicine | Admitting: Internal Medicine

## 2017-08-10 DIAGNOSIS — J84112 Idiopathic pulmonary fibrosis: Secondary | ICD-10-CM | POA: Diagnosis not present

## 2017-08-10 NOTE — Progress Notes (Signed)
Daily Session Note  Patient Details  Name: Shane [name] MRN: [id number] Date of Birth: 09-15-1941 Referring Provider:     Pulmonary Rehab Walk Test from 03/30/2017 in [place]  Referring Provider  Dr. Chase Caller      Encounter Date: 08/10/2017  Check In:     Session Check In - 08/10/17 1212      Check-In   [facility]   Staff Present Su Hilt, MS, ACSM RCEP, Exercise Physiologist;Lisa Ysidro Evert, RN;Other;Portia Rollene Rotunda, RN, BSN   Supervising physician immediately available to respond to emergencies [facility] immediately available   Physician(s) Dr. Bonner Puna   Medication changes reported     No   Fall or balance concerns reported    No   Tobacco Cessation No Change   Warm-up and Cool-down Performed as group-led instruction   Resistance Training Performed Yes   VAD Patient? No     Pain Assessment   Currently in Pain? No/denies   Multiple Pain Sites No      Capillary Blood Glucose: No results found for this or any previous visit (from the past 24 hour(s)).     POCT Glucose - 08/10/17 1216      POCT Blood Glucose   Pre-Exercise 111 mg/dL   Post-Exercise 117 mg/dL         Exercise Prescription Changes - 08/10/17 1200      Response to Exercise   Blood Pressure (Admit) 104/56   Blood Pressure (Exercise) 156/66   Blood Pressure (Exit) 94/62   Heart Rate (Admit) 77 bpm   Heart Rate (Exercise) 88 bpm   Heart Rate (Exit) 74 bpm   Oxygen Saturation (Admit) 96 %   Oxygen Saturation (Exercise) 94 %   Oxygen Saturation (Exit) 97 %   Rating of Perceived Exertion (Exercise) 12   Perceived Dyspnea (Exercise) 1   Duration Continue with 45 min of aerobic exercise without signs/symptoms of physical distress.   Intensity THRR unchanged     Progression   Progression Continue to progress workloads to maintain intensity without signs/symptoms of physical distress.     Resistance Training   Training  Prescription Yes   Weight blue bands   Reps 10-15   Time 10 Minutes     Interval Training   Interval Training No     Bike   Level 1.4   Minutes 17     Track   Laps 12   Minutes 17      History  Smoking Status  . Never Smoker  Smokeless Tobacco  . Never Used    Goals Met:  Exercise tolerated well No report of cardiac concerns or symptoms Strength training completed today  Goals Unmet:  Not Applicable  Comments: Service time is from 1030 to 1200    Dr. Rush Farmer is Medical Director for Pulmonary Rehab at [facility].

## 2017-08-15 ENCOUNTER — Encounter (HOSPITAL_COMMUNITY)
Admission: RE | Admit: 2017-08-15 | Discharge: 2017-08-15 | Disposition: A | Discharge status: Home / Self Care | Payer: Medicare Other | Source: Ambulatory Visit | Attending: Internal Medicine | Admitting: Internal Medicine

## 2017-08-15 DIAGNOSIS — J84112 Idiopathic pulmonary fibrosis: Secondary | ICD-10-CM | POA: Diagnosis not present

## 2017-08-23 ENCOUNTER — Telehealth: Payer: Self-pay | Admitting: Internal Medicine

## 2017-08-23 DIAGNOSIS — J84112 Idiopathic pulmonary fibrosis: Secondary | ICD-10-CM

## 2017-08-23 NOTE — Telephone Encounter (Signed)
Shane [name]   Got request from rehab to place an order for maintenace pulm rehab for IPF. Please do  Thanks  Dr. Brand Males, M.D., [facility].C.P Pulmonary and Critical Care Medicine Staff Physician [place] [facility] Pager: [phone], If no answer or between  15:00h - 7:00h: call [phone]  08/23/2017 2:13 PM

## 2017-08-29 ENCOUNTER — Other Ambulatory Visit (HOSPITAL_COMMUNITY): Payer: Self-pay

## 2017-08-29 ENCOUNTER — Encounter (HOSPITAL_COMMUNITY)
Admission: RE | Admit: 2017-08-29 | Discharge: 2017-08-29 | Disposition: A | Discharge status: Home / Self Care | Payer: Medicare Other | Source: Ambulatory Visit | Attending: Internal Medicine | Admitting: Internal Medicine

## 2017-08-29 NOTE — Progress Notes (Signed)
Shane [name] started the Pulmonary Maintenance Program today.He tolerated exercise well without complaints.

## 2017-08-29 NOTE — Telephone Encounter (Signed)
Called and spoke with pt's wife about the order needed from pulm rehab and told her I was going to take care of placing that. While speaking with Shane [name], I told her that we had received a letter stating pt had been having some side effects from [place]. Shane [name] stated to me that pt's side effects were mild and that she didn't think they needed to come in sooner than research visit on 09/04/17 followed by an OV with Dr. Annamaria Boots following up on HST. Stated to her that if they felt pt needed to come in sooner to give [place] a call back. She understood. Nothing further needed.

## 2017-08-30 ENCOUNTER — Encounter (HOSPITAL_COMMUNITY): Payer: Self-pay | Admitting: *Deleted

## 2017-08-31 ENCOUNTER — Encounter (HOSPITAL_COMMUNITY)
Admission: RE | Admit: 2017-08-31 | Discharge: 2017-08-31 | Disposition: A | Discharge status: Home / Self Care | Payer: Medicare Other | Source: Ambulatory Visit | Attending: Internal Medicine | Admitting: Internal Medicine

## 2017-09-04 ENCOUNTER — Telehealth: Payer: Self-pay | Admitting: Internal Medicine

## 2017-09-04 ENCOUNTER — Other Ambulatory Visit (INDEPENDENT_AMBULATORY_CARE_PROVIDER_SITE_OTHER): Payer: Medicare Other

## 2017-09-04 DIAGNOSIS — Z5181 Encounter for therapeutic drug level monitoring: Secondary | ICD-10-CM

## 2017-09-04 LAB — CBC WITH DIFFERENTIAL/PLATELET
Basophils Absolute: 0 10*3/uL (ref 0.0–0.1)
Basophils Relative: 0.1 % (ref 0.0–3.0)
Eosinophils Absolute: 0.9 10*3/uL — ABNORMAL HIGH (ref 0.0–0.7)
Eosinophils Relative: 8.1 % — ABNORMAL HIGH (ref 0.0–5.0)
HCT: 44.7 % (ref 39.0–52.0)
Hemoglobin: 15.2 g/dL (ref 13.0–17.0)
Lymphocytes Relative: 32.6 % (ref 12.0–46.0)
Lymphs Abs: 3.4 10*3/uL (ref 0.7–4.0)
MCHC: 34 g/dL (ref 30.0–36.0)
MCV: 95 fl (ref 78.0–100.0)
Monocytes Absolute: 1.4 10*3/uL — ABNORMAL HIGH (ref 0.1–1.0)
Monocytes Relative: 12.9 % — ABNORMAL HIGH (ref 3.0–12.0)
Neutro Abs: 4.8 10*3/uL (ref 1.4–7.7)
Neutrophils Relative %: 46.3 % (ref 43.0–77.0)
Platelets: 234 10*3/uL (ref 150.0–400.0)
RBC: 4.7 Mil/uL (ref 4.22–5.81)
RDW: 14.9 % (ref 11.5–15.5)
WBC: 10.5 10*3/uL (ref 4.0–10.5)

## 2017-09-04 LAB — HEPATIC FUNCTION PANEL
ALT: 17 U/L (ref 0–53)
AST: 16 U/L (ref 0–37)
Albumin: 3.6 g/dL (ref 3.5–5.2)
Alkaline Phosphatase: 83 U/L (ref 39–117)
Bilirubin, Direct: 0.1 mg/dL (ref 0.0–0.3)
Total Bilirubin: 0.5 mg/dL (ref 0.2–1.2)
Total Protein: 7.2 g/dL (ref 6.0–8.3)

## 2017-09-04 LAB — BASIC METABOLIC PANEL
BUN: 30 mg/dL — ABNORMAL HIGH (ref 6–23)
CO2: 26 mEq/L (ref 19–32)
Calcium: 8.8 mg/dL (ref 8.4–10.5)
Chloride: 99 mEq/L (ref 96–112)
Creatinine, Ser: 1.54 mg/dL — ABNORMAL HIGH (ref 0.40–1.50)
GFR: 46.88 mL/min — ABNORMAL LOW (ref >60.00)
Glucose, Bld: 149 mg/dL — ABNORMAL HIGH (ref 70–99)
Potassium: 4.2 mEq/L (ref 3.5–5.1)
Sodium: 136 mEq/L (ref 135–145)

## 2017-09-04 NOTE — Telephone Encounter (Signed)
Spoke with pt's spouse, aware of recs.  Labs ordered.  Nothing further needed at this time.

## 2017-09-04 NOTE — Telephone Encounter (Signed)
He needs t ocome in an do cbc, bmet and lft due to his symptoms while on Ofev (he already had LFT issues on esbriet). Prefr he does it  09/04/2017 in our lab  STOP OFEV - as he did. DO NOT GO BACK on ofev until futher notice  Dr. Brand Males, M.D., [facility].C.P Pulmonary and Critical Care Medicine Staff Physician [place] [facility] Pager: [phone], If no answer or between  15:00h - 7:00h: call [phone]  09/04/2017 9:33 AM

## 2017-09-04 NOTE — Telephone Encounter (Signed)
Spoke with pt's spouse [name] (dpr on file), states that on Friday pt started experiencing nausea, stomach upset, diarrhea, abdominal tenderness, general weakness, loss of appetite.  Pt's wife also notes that X1 week pt has had "liver spots" showing up on pt's arms and legs.   Pt's wife states that pt denies fever, vomiting.   Pt stopped taking Ofev on Saturday which has improved s/s, but pt is still not eating much, and when he does has diarrhea.    Pt has taken anti-diarrheal meds that were given with Ofev packet, as well as maintaining fluid intake.  Requesting further recs.  [facility]   MR please advise.  Thanks.

## 2017-09-05 ENCOUNTER — Encounter (HOSPITAL_COMMUNITY): Payer: Medicare Other

## 2017-09-05 ENCOUNTER — Telehealth: Payer: Self-pay | Admitting: Internal Medicine

## 2017-09-05 DIAGNOSIS — K219 Gastro-esophageal reflux disease without esophagitis: Secondary | ICD-10-CM | POA: Insufficient documentation

## 2017-09-05 DIAGNOSIS — Z7982 Long term (current) use of aspirin: Secondary | ICD-10-CM | POA: Diagnosis not present

## 2017-09-05 DIAGNOSIS — Z794 Long term (current) use of insulin: Secondary | ICD-10-CM | POA: Insufficient documentation

## 2017-09-05 DIAGNOSIS — E1122 Type 2 diabetes mellitus with diabetic chronic kidney disease: Secondary | ICD-10-CM | POA: Diagnosis not present

## 2017-09-05 DIAGNOSIS — Z79899 Other long term (current) drug therapy: Secondary | ICD-10-CM | POA: Diagnosis not present

## 2017-09-05 DIAGNOSIS — N189 Chronic kidney disease, unspecified: Secondary | ICD-10-CM | POA: Diagnosis not present

## 2017-09-05 DIAGNOSIS — E785 Hyperlipidemia, unspecified: Secondary | ICD-10-CM | POA: Insufficient documentation

## 2017-09-05 DIAGNOSIS — J84112 Idiopathic pulmonary fibrosis: Secondary | ICD-10-CM | POA: Insufficient documentation

## 2017-09-05 DIAGNOSIS — G4730 Sleep apnea, unspecified: Secondary | ICD-10-CM | POA: Insufficient documentation

## 2017-09-05 DIAGNOSIS — I129 Hypertensive chronic kidney disease with stage 1 through stage 4 chronic kidney disease, or unspecified chronic kidney disease: Secondary | ICD-10-CM | POA: Insufficient documentation

## 2017-09-05 NOTE — Telephone Encounter (Signed)
Called pt and spoke with pt's wife [name] letting her know that the labwork was normal and for pt to stop taking OFEV for one week and then after that to begin back on the daily dose until OV on 10/04/17.  Dana Allan for them to call our office if pt does get worse and if he needs to be seen before scheduled OV. Nothing further needed.

## 2017-09-05 NOTE — Telephone Encounter (Signed)
LEt  [name] or his wife know that LFT normal. Good news. However, I think side effects are from ofev.  Plan - 1 week holiday off ofev and 1 week from 09/05/2017  - start ofev at 150mg  daily dose and continue at this dose till visti with me 10/04/17   Dr. Brand Males, M.D., [facility].C.P Pulmonary and Critical Care Medicine Staff Physician [place] [facility] Pager: [phone], If no answer or between  15:00h - 7:00h: call [phone]  09/05/2017 9:24 AM       PULMONARY No results for input(s): PHART, PCO2ART, PO2ART, HCO3, TCO2, O2SAT in the last 168 hours.  Invalid input(s): PCO2, PO2  CBC  Recent Labs Lab 09/04/17 1358  HGB 15.2  HCT 44.7  WBC 10.5  PLT 234.0    COAGULATION No results for input(s): INR in the last 168 hours.  CARDIAC  No results for input(s): TROPONINI in the last 168 hours. No results for input(s): PROBNP in the last 168 hours.   CHEMISTRY  Recent Labs Lab 09/04/17 1358  NA 136  K 4.2  CL 99  CO2 26  GLUCOSE 149*  BUN 30*  CREATININE 1.54*  CALCIUM 8.8   Estimated Creatinine Clearance: 53.4 mL/min (A) (by C-G formula based on SCr of 1.54 mg/dL (H)).   LIVER  Recent Labs Lab 09/04/17 1358  AST 16  ALT 17  ALKPHOS 83  BILITOT 0.5  PROT 7.2  ALBUMIN 3.6     INFECTIOUS No results for input(s): LATICACIDVEN, PROCALCITON in the last 168 hours.   ENDOCRINE CBG (last 3)  No results for input(s): GLUCAP in the last 72 hours.       IMAGING x48h  - image(s) personally visualized  -   highlighted in bold No results found.

## 2017-09-06 ENCOUNTER — Ambulatory Visit: Payer: Medicare Other | Admitting: Internal Medicine

## 2017-09-07 ENCOUNTER — Telehealth: Payer: Self-pay | Admitting: Internal Medicine

## 2017-09-07 ENCOUNTER — Encounter (HOSPITAL_COMMUNITY)
Admission: RE | Admit: 2017-09-07 | Discharge: 2017-09-07 | Disposition: A | Discharge status: Home / Self Care | Payer: Medicare Other | Source: Ambulatory Visit | Attending: Internal Medicine | Admitting: Internal Medicine

## 2017-09-07 NOTE — Telephone Encounter (Signed)
Per [facility], pt needs an office visit with CY between 08/10/17 and 10/08/17 to document cpap compliance/benefit.  Pt had an appt on 10/3 but cancelled this d/t transportation issues.  Need to reschedule appt.  lmtcb X1 for pt to reschedule.   Will route back to [facility] for follow up.

## 2017-09-08 NOTE — Telephone Encounter (Signed)
atc pt on preferred number X3, line rang to fast busy signal. Routing to KW for follow-up as this was not generated in triage- I created this phone message based on the below info given to me by CY.

## 2017-09-08 NOTE — Telephone Encounter (Signed)
Pt can be seen on 10-22 or 10-23 in the RNA 11:30am slot. Thanks.

## 2017-09-12 ENCOUNTER — Encounter (HOSPITAL_COMMUNITY)
Admission: RE | Admit: 2017-09-12 | Discharge: 2017-09-12 | Disposition: A | Discharge status: Home / Self Care | Payer: Medicare Other | Source: Ambulatory Visit | Attending: Internal Medicine | Admitting: Internal Medicine

## 2017-09-12 NOTE — Telephone Encounter (Signed)
Spoke with wife at work number-appt made for Wednesday 09-27-17 at 11:30am. Nothing more needed at this time.

## 2017-09-14 ENCOUNTER — Encounter (HOSPITAL_COMMUNITY)
Admission: RE | Admit: 2017-09-14 | Discharge: 2017-09-14 | Disposition: A | Discharge status: Home / Self Care | Payer: Medicare Other | Source: Ambulatory Visit | Attending: Internal Medicine | Admitting: Internal Medicine

## 2017-09-14 DIAGNOSIS — J84112 Idiopathic pulmonary fibrosis: Secondary | ICD-10-CM | POA: Diagnosis not present

## 2017-09-14 LAB — GLUCOSE, CAPILLARY: Glucose-Capillary: 121 mg/dL — ABNORMAL HIGH (ref 65–99)

## 2017-09-18 ENCOUNTER — Encounter (HOSPITAL_COMMUNITY): Payer: Self-pay

## 2017-09-18 DIAGNOSIS — J84112 Idiopathic pulmonary fibrosis: Secondary | ICD-10-CM

## 2017-09-18 NOTE — Progress Notes (Signed)
Discharge Progress Report  Patient Details  Name: Shane [name] MRN: [id number] Date of Birth: January 17, 1941 Referring Provider:     Pulmonary Rehab Walk Test from 03/30/2017 in [place]  Referring Provider  Dr. Chase Caller       Number of Visits: 36  Reason for Discharge:  Patient reached a stable level of exercise. Patient independent in their exercise. Patient has met program and personal goals.  Smoking History:  History  Smoking Status  . Never Smoker  Smokeless Tobacco  . Never Used    Diagnosis:  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[place])  ADL UCSD:     Pulmonary Assessment Scores    Row Name 08/15/17 1638 08/30/17 1107       ADL UCSD   ADL Phase  - Exit    SOB Score total  - 32      CAT Score   CAT Score  - 10  Exit      mMRC Score   mMRC Score pre:3 post:2  -       Initial Exercise Prescription:   Discharge Exercise Prescription (Final Exercise Prescription Changes):     Exercise Prescription Changes - 08/10/17 1200      Response to Exercise   Blood Pressure (Admit) 104/56   Blood Pressure (Exercise) 156/66   Blood Pressure (Exit) 94/62   Heart Rate (Admit) 77 bpm   Heart Rate (Exercise) 88 bpm   Heart Rate (Exit) 74 bpm   Oxygen Saturation (Admit) 96 %   Oxygen Saturation (Exercise) 94 %   Oxygen Saturation (Exit) 97 %   Rating of Perceived Exertion (Exercise) 12   Perceived Dyspnea (Exercise) 1   Duration Continue with 45 min of aerobic exercise without signs/symptoms of physical distress.   Intensity THRR unchanged     Progression   Progression Continue to progress workloads to maintain intensity without signs/symptoms of physical distress.     Resistance Training   Training Prescription Yes   Weight blue bands   Reps 10-15   Time 10 Minutes     Interval Training   Interval Training No     Bike   Level 1.4   Minutes 17     Track   Laps 12   Minutes 17      Functional Capacity:     [place] Name 08/15/17 1636         6 Minute Walk   Phase Discharge     Distance 1200 feet     Distance Feet Change 600 ft     Walk Time 6 minutes     # of Rest Breaks 0     MPH 2.27     METS 2.76     RPE 12     Perceived Dyspnea  1     Symptoms No     Comments used walker     Resting HR 65 bpm     Resting BP 118/60     Resting Oxygen Saturation  98 %     Exercise Oxygen Saturation  during 6 min walk 90 %     Max Ex. HR 96 bpm     Max Ex. BP 158/70       Interval HR   1 Minute HR 87     2 Minute HR 87     3 Minute HR 91     4 Minute HR 95     5 Minute  HR 96     6 Minute HR 96     2 Minute Post HR 76     Interval Heart Rate? Yes       Interval Oxygen   Interval Oxygen? Yes     Baseline Oxygen Saturation % 98 %     1 Minute Oxygen Saturation % 93 %     1 Minute Liters of Oxygen 0 L     2 Minute Oxygen Saturation % 93 %     2 Minute Liters of Oxygen 0 L     3 Minute Oxygen Saturation % 94 %     3 Minute Liters of Oxygen 0 L     4 Minute Oxygen Saturation % 93 %     4 Minute Liters of Oxygen 0 L     5 Minute Oxygen Saturation % 90 %     5 Minute Liters of Oxygen 0 L     6 Minute Oxygen Saturation % 90 %     6 Minute Liters of Oxygen 0 L     2 Minute Post Oxygen Saturation % 98 %     2 Minute Post Liters of Oxygen 0 L        Psychological, QOL, Others - Outcomes: PHQ 2/9: Depression screen [facility] 2/9 08/08/2017 03/27/2017  Decreased Interest 0 0  Down, Depressed, Hopeless 0 0  PHQ - 2 Score 0 0    Quality of Life:   Personal Goals: Goals established at orientation with interventions provided to work toward goal.    Personal Goals Discharge:     Goals and Risk Factor Review    Row Name 08/02/17 1737 09/01/17 1130 09/18/17 0841         Core Components/Risk Factors/Patient Goals Review   Personal Goals Review Weight Management/Obesity;Diabetes;Develop more efficient breathing techniques such as purse lipped breathing and diaphragmatic  breathing and practicing self-pacing with activity.;Improve shortness of breath with ADL's Weight Management/Obesity;Diabetes  -     Review Patient continues to improve and progress towards all rehab goals at every session. He will graduate within the next 30 days and is already in the process of joining a gym. he is extremely proud of the progress he has made during his pulmonary rehab admission. Pt has lost 10 lb since admission. No new A1c available to evaluate glycemic control.  patient met all pulmonary rehab goals at discharge     Expected Outcomes see Admission expected outcomes see Nutrition section expected outcomes  -        Exercise Goals and Review:   Nutrition & Weight - Outcomes:      Post Biometrics - 08/15/17 1639       Post  Biometrics   Grip Strength 36 kg      Nutrition:   Nutrition Discharge:     Nutrition Assessments - 09/01/17 1128      Rate Your Plate Scores   Pre Score 49   Post Score 72      Education Questionnaire Score:     Knowledge Questionnaire Score - 08/30/17 1107      Knowledge Questionnaire Score   Post Score 13/13      Goals reviewed with patient; copy given to patient. Shane [name] has enrolled in our pulmonary rehab maintenance program since discharge.

## 2017-09-19 ENCOUNTER — Encounter (HOSPITAL_COMMUNITY)
Admission: RE | Admit: 2017-09-19 | Discharge: 2017-09-19 | Disposition: A | Discharge status: Home / Self Care | Payer: Medicare Other | Source: Ambulatory Visit | Attending: Internal Medicine | Admitting: Internal Medicine

## 2017-09-21 ENCOUNTER — Encounter (HOSPITAL_COMMUNITY)
Admission: RE | Admit: 2017-09-21 | Discharge: 2017-09-21 | Disposition: A | Discharge status: Home / Self Care | Payer: Medicare Other | Source: Ambulatory Visit | Attending: Internal Medicine | Admitting: Internal Medicine

## 2017-09-25 ENCOUNTER — Ambulatory Visit (INDEPENDENT_AMBULATORY_CARE_PROVIDER_SITE_OTHER): Payer: Medicare Other | Admitting: Internal Medicine

## 2017-09-25 ENCOUNTER — Encounter: Payer: Self-pay | Admitting: Internal Medicine

## 2017-09-25 DIAGNOSIS — J84112 Idiopathic pulmonary fibrosis: Secondary | ICD-10-CM | POA: Diagnosis not present

## 2017-09-25 DIAGNOSIS — G4733 Obstructive sleep apnea (adult) (pediatric): Secondary | ICD-10-CM | POA: Diagnosis not present

## 2017-09-25 NOTE — Progress Notes (Signed)
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 He is followed here by Dr. Chase Caller for ILD/Esbriet/pulmonary rehabilitation SLEEP CONSULT patient is here for a sleep consult, patient has a CPAP but it is about 76 years old and does work very well patient sleeps about 8 hours patients wife states that the machine in turning on and off at night and the mask is slipping on and off  Medical problem list includes DM2, CKD, GERD, HBP It sounds as if original diagnostic study was by neurology with no follow-up. He had been getting supplies on line. Pressure 15 with nasal mask. He says he has slept very well with CPAP and wife confirms he has used all night every night machine is now old and he needs to establish with a DME company for replacement. 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 Pt wears CPAP nightly and DL attached. No new supplies needed at this time.  He likes his replacement CPAP machine much better and is comfortable with nasal pillows mask and current pressure. Download 100% compliance averaging over 8 hours per night, AHI 0.9/hour.  ROS-see HPI   Negative unless "+" Constitutional:    weight loss, night sweats, fevers, chills, fatigue, lassitude. HEENT:    headaches, difficulty swallowing, tooth/dental problems, sore throat,       sneezing, itching, ear ache, nasal congestion, post nasal drip, snoring CV:    chest pain,  orthopnea, PND, swelling in lower extremities, anasarca,                                                  dizziness, palpitations Resp:   shortness of breath with exertion or at rest.                productive cough,   non-productive cough, coughing up of blood.              change in color of mucus.  wheezing.   Skin:    rash or lesions. GI:  No-   heartburn, indigestion, abdominal pain, nausea, vomiting, diarrhea,                 change in bowel habits, loss of appetite GU: dysuria, change in color of urine, no urgency or frequency.   flank pain. MS:   joint pain, stiffness, decreased range of motion, back pain. Neuro-     nothing unusual Psych:  change in mood or affect.  depression or anxiety.   memory loss.  OBJ- Physical Exam General- Alert, Oriented, Affect-appropriate, Distress- none acute, Muscular Skin- rash-none, lesions- none, excoriation- none Lymphadenopathy- none Head- atraumatic            Eyes- Gross vision intact, PERRLA, conjunctivae and secretions clear            Ears- Hearing, canals-normal            Nose- Clear, no-Septal dev, mucus, polyps, erosion, perforation  Throat- Mallampati IV , mucosa clear , drainage- none, tonsils- atrophic Neck- flexible , trachea midline, no stridor , thyroid nl, carotid no bruit Chest - symmetrical excursion , unlabored           Heart/CV- RRR , no murmur , no gallop  , no rub, nl s1 s2                           - JVD- none , edema- none, stasis changes- none, varices- none           Lung- + scattered crackles, wheeze- none, cough- none , dullness-none, rub- none           Chest wall-  Abd-  Br/ Gen/ Rectal- Not done, not indicated Extrem- cyanosis- none, clubbing, none, atrophy- none, strength- nl Neuro- grossly intact to observation

## 2017-09-25 NOTE — Patient Instructions (Signed)
We can continue CPAP auto 5-20, mask of choice, humidifier, supplies, AirView    Dx OSA  You will to continue to follow with Dr Chase Caller foir your lung disease  Please call if we can help

## 2017-09-26 ENCOUNTER — Encounter (HOSPITAL_COMMUNITY)
Admission: RE | Admit: 2017-09-26 | Discharge: 2017-09-26 | Disposition: A | Discharge status: Home / Self Care | Payer: Medicare Other | Source: Ambulatory Visit | Attending: Internal Medicine | Admitting: Internal Medicine

## 2017-09-26 NOTE — Assessment & Plan Note (Signed)
Download confirms his report of good compliance and control. He is pleased with his new machine and sleeping very well. Pressure ranges good. Plan-continue CPAP auto 5-20/[facility]

## 2017-09-26 NOTE — Assessment & Plan Note (Signed)
Obvious interstitial crackles on chest exam. He will follow-up with Dr. Chase Caller.

## 2017-09-27 ENCOUNTER — Ambulatory Visit: Payer: Medicare Other | Admitting: Internal Medicine

## 2017-09-28 ENCOUNTER — Encounter (HOSPITAL_COMMUNITY)
Admission: RE | Admit: 2017-09-28 | Discharge: 2017-09-28 | Disposition: A | Discharge status: Home / Self Care | Payer: Medicare Other | Source: Ambulatory Visit | Attending: Internal Medicine | Admitting: Internal Medicine

## 2017-10-03 ENCOUNTER — Encounter (HOSPITAL_COMMUNITY)
Admission: RE | Admit: 2017-10-03 | Discharge: 2017-10-03 | Disposition: A | Discharge status: Home / Self Care | Payer: Medicare Other | Source: Ambulatory Visit | Attending: Internal Medicine | Admitting: Internal Medicine

## 2017-10-04 ENCOUNTER — Ambulatory Visit (INDEPENDENT_AMBULATORY_CARE_PROVIDER_SITE_OTHER): Payer: Medicare Other | Admitting: Internal Medicine

## 2017-10-04 ENCOUNTER — Other Ambulatory Visit (INDEPENDENT_AMBULATORY_CARE_PROVIDER_SITE_OTHER): Payer: Medicare Other

## 2017-10-04 ENCOUNTER — Encounter: Payer: Self-pay | Admitting: Internal Medicine

## 2017-10-04 VITALS — BP 124/64 | HR 75 | Ht 73.0 in | Wt 262.0 lb

## 2017-10-04 DIAGNOSIS — Z5181 Encounter for therapeutic drug level monitoring: Secondary | ICD-10-CM

## 2017-10-04 DIAGNOSIS — J84112 Idiopathic pulmonary fibrosis: Secondary | ICD-10-CM | POA: Diagnosis not present

## 2017-10-04 LAB — PULMONARY FUNCTION TEST
DL/VA % pred: 79 %
DL/VA: 3.79 ml/min/mmHg/L
DLCO cor % pred: 42 %
DLCO cor: 15.31 ml/min/mmHg
DLCO unc % pred: 41 %
DLCO unc: 15.04 ml/min/mmHg
FEF 25-75 Pre: 4.28 L/sec
FEF2575-%Pred-Pre: 175 %
FEV1-%Pred-Pre: 79 %
FEV1-Pre: 2.69 L
FEV1FVC-%Pred-Pre: 127 %
FEV6-%Pred-Pre: 66 %
FEV6-Pre: 2.94 L
FEV6FVC-%Pred-Pre: 106 %
FVC-%Pred-Pre: 62 %
FVC-Pre: 2.94 L
Pre FEV1/FVC ratio: 92 %
Pre FEV6/FVC Ratio: 100 %

## 2017-10-04 LAB — HEPATIC FUNCTION PANEL
ALT: 27 U/L (ref 0–53)
AST: 22 U/L (ref 0–37)
Albumin: 3.8 g/dL (ref 3.5–5.2)
Alkaline Phosphatase: 90 U/L (ref 39–117)
Bilirubin, Direct: 0.1 mg/dL (ref 0.0–0.3)
Total Bilirubin: 0.6 mg/dL (ref 0.2–1.2)
Total Protein: 8 g/dL (ref 6.0–8.3)

## 2017-10-04 NOTE — Patient Instructions (Signed)
PFT done today. 

## 2017-10-04 NOTE — Addendum Note (Signed)
Addended by: Rocky Morel D on: 10/04/2017 11:07 AM   Modules accepted: Orders

## 2017-10-04 NOTE — Progress Notes (Signed)
IPF PRO Registry Purpose: To collect data and biological samples that will support future research studies.  Registry will describe current approaches to diagnosis and treatment of IPF, analyze participant characteristics to describe the natural history of the disease, assess quality of life, describe participants interactions with the health care system, describe IPF treatment practices across multiple institutions, and utilize biological samples linked to well characterized IPF participants to identify disease biomarkers.   Clinical Research Coordinator / Research RN note : This visit for Subject Shane [name] with DOB: 1941-02-15 on 10/04/2017 for the above protocol for his 6 month follow up . The consent and protocol  for this encounter is currently IRB approved. Subject expressed continued interest and consent in continuing as a study subject. Subject confirmed that there was no change in contact information (e.g. address, telephone, email). Subject thanked for participation in research and contribution to science.   In this visit 10/04/2017 the subject was be evaluated by investigator named Dr. Chase Caller  . This research coordinator has verified that the investigator is uptodate with his/her training logs.     Signed by  August Luz  Clinical Research Coordinator / Nurse [facility]  [place], [place] 12:20 PM 10/04/2017

## 2017-10-04 NOTE — Progress Notes (Addendum)
Subjective:     Patient ID: Shane [name], male   DOB: 1941/07/13, 76 y.o.   MRN: [id number]  HPI     PCP ARONSON,RICHARD A, MD  HPI   IOV 10/12/2016  Chief Complaint  Patient presents with  . Pulomary Consult    Pt referred by Dr. [name] for DOE x months. pt states the SOB has worsened in the last couple weeks. Pt c/o dry cough. pt deneis f/c/s.     76 year old male referred for dyspnea. Reports that he is obese on CPAP on RA for years. 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 He has new onset dyspnea and cough. Details in [facility] ILD questionnaire below. CXR recently personally visualized shows ILD changes. Reports that pneumonia and lasix Rx by PCP have not helped. Review of old CT chest 12/05/2008 shows bibasal ILD without honeuycombing on non HRCT chest. Possibly datin back to 2007 CT abd lung cut. Current findings are worse.   Symptos:  Dyspnea is new x 4-5 months gets dyspneic hurrying on level ground or stops at 100 yerds. Cough x 3 weeks with cough at night and is dry and can be episodic with severe attacks.   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 Did get xposed to insecticide, fertiiler, talc pain, cement, tile    OV  11/23/2016  Chief Complaint  Patient presents with  . Follow-up    Pt here after PFT, HRCT, RHC. Pt state his breathing has slightly improved. Pt c/o increase in dry cough. Pt deneis CP/tightness and f/c/s.    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 He and his wife stated that now he is getting progressively more  short of breath. Although he does tell me that when he tries to work on his car and he tries to push himself he does start feeling better but he feels that he is deconditioned but is unable to condition himself more in order to feel less short of breath. Wife though feels that his dyspnea is more progressive.  In terms of disease etiology:   - He had high resolution CT chest 10/08/2016 and the CT findings are not consistent with UIP. And that is chronicity to this disease with presence even 7 years ago. This mild progression only since then. He had autoimmune profile which is essentially negative.  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 He will only walk 2 laps and stopped because of shortness of breath. But his pulse ox did not drop below 95%. Resting pulse ox was 99%. Peak heart rate was 102. In essence that was clinically insignificant desaturation    In terms of operative risk for surgical lung biopsy   - His  right heart cath shows normal mean pulmonary artery pressure 20 mm with slight elevation pulmonary artery systolic pressure to 34 mmHg and a normal wedge pressure.    - His Myoview is reported normal in November 2017    - He does have sleep apnea and uses CPA   - He does have gait instability issues because of his chronic spine disease but this is stable    OV 01/05/2017  Chief Complaint  Patient presents with  . Follow-up    Pt here after biopsy. Pt states his breathing is unchanged since last seen in 11/2016. Pt c/o dry cough. Pt denies CP/tightness and f/c/s.     llung bx discussion   - here wife wife. 12/26/16 - UIP by [name]. No new diagnosis    OV  02/20/2017  Chief Complaint  Patient presents with  . Follow-up    Pt states his breathing is unchanged since last OV. Pt still has not recieved the Esbriet because of financial issues and unable to locate a foundation to help. Pt c/o dry cough. Pt denies CP/tightness.     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  He is here to follow-up how he was doing on Pirfenidone (Esbriet). However he had delays getting charity support. Helpful foundation ran out of money versus from his income level too high to provide support. He wants to plan of the [place]. Overall he feels stable in terms of cough and dyspnea. He is interested in pulmonary rehabilitation and he has been in touch with [name] the chair of the local pulmonary fibrosis foundation support group and plans to attend the meeting March 29. There are no other new issues     OV 05/05/2017  Chief Complaint  Patient presents with  . Follow-up    pt states he is at baseline, some sob with exertion.     FU IPF; diagnosed based on biopsy Jan 2018.   Now on esbriet x 2 months. Good tolerance. Only mild fatigue. No GI side efects. Feels better. Seems to think esbriet improves situation; re-educated it does not.  Not using sun screen ; did not remember despite prior education. Wants referral for sleep doc.    OV 06/29/2017  Chief Complaint  Patient presents with  . Follow-up    Pt states his breathing is doing well. Pt states he has an occassional dry cough. Pt denies CP/tightness and f/c/s.     Follow-up idiopathic pulmonary fibrosis  Last visit June 2018 at that time he was on Pirfenidone (Esbriet) for 2 months. After that he had routine liver function tests and started having increased liver enzymes. We at first stopped the statins but this did not improve the liver function test. In fact it got worse. Finally we stopped Pirfenidone (Esbriet) and his liver function tests as of today has normalized.  He did report significant fatigue while on Pirfenidone (Esbriet). Now that he's office. The fatigue is resolved. He is doing pulmonary rehabilitation and this is helping his physical conditioning significantly and he feels good. Overall he feels is IPF is stable. He is here with his wife and they both extremely interested in further antifibiotic therapy particularly Ofev. WE discussed   OV 10/04/2017  Chief Complaint  Patient presents with  . Follow-up    pft done today, no change in breathing, mild coughing/non productive   Follow-up idiopathic pulmonary fibrosis biopsy-proven January 2018. Pirfenidone (Esbriet) failure with abnormal liver function test.Started on Ofev July 2018  He has normal liver function test 09/04/2017. Pulmonary function test today shows continued stability. Walking desaturation test 185 feet 3 laps on room air: Resting heart rate 72/m. Final heart rate 91/m.Resting pulse ox 100%. Final pulse ox 97%. He is here with his wife. They both at test that he is feeling better because of pulmonary rehabilitation. He is also tolerating the Ofev quite well except for mild occasional diarrhea. His main concern is that randomly for the last few monthsbut at night while sitting and postprandial he feels few to several seconds of a catch and shortness of breath but at this time he does not desaturate. Otherwise no problems. He's taking treatment for sleep apnea. He is interested in research protocols.He does feel a catch in his lower back which he thinks is from fibrosis pulling on his chest.    K-BILD ILD QUESTIONNAIRE, Symptom score over prior 2 weeks  7-none, 6-rarely, 5-occ, 5-some times, 3-sev times, 2-most times, 1-every time 10/04/2017   Dyspnea for stairs, incline or hill 1  Chest Tightness 6  Worry about seriousness of lung complaint 6  Avoided doing things that make you dyspneic 4  Have you felt loss of control of lung condition (reversed from original) 5  Felt fed up  due to lung condition 6  Felt urge to breathe aka air hunger 4  Has lung condition made you feel anxious 6  How often have you experienced wheezing or whistling sound 4  How much of the time have you felt your lung dz is getting worse 6  How much has your lung condition interfered with job or daily task 2  Were you expecting your lung condition to get worse 6  How much has your lung function limited you carrying things like groceris 3  How much has your lung function made you think of EOL? 6  Total   Are you financially worse off 4  Grand Total       Results for SHLOMA, ROGGENKAMP (MRN [id number]) as of 10/04/2017 10:34  Ref. Range 10/14/2016 08:19 04/06/2017 10:13 10/04/2017 08:51  FVC-Pre Latest Units: L 3.00 2.94 2.94  FVC-%Pred-Pre Latest Units: % 63 62 62   Results for RAMIZ, TURPIN (MRN [id number]) as of 10/04/2017 10:34  Ref. Range 10/14/2016 08:19 04/06/2017 10:13 10/04/2017 08:51  DLCO unc Latest Units: ml/min/mmHg 16.82 16.43 15.04  DLCO unc % pred Latest Units: % 46 45 41     has a past medical history of Cancer ([place]); Cataract; Chest pain; Chronic kidney disease; Diabetes mellitus; Dyspnea; GERD (gastroesophageal reflux disease); Hyperlipidemia; Hypertension; Neuromuscular disorder ([place]); PONV (postoperative nausea and vomiting); and Sleep apnea.   reports that he has never smoked. He has never used smokeless tobacco.  Past Surgical History:  Procedure Laterality Date  . ANTERIOR FUSION CERVICAL SPINE  2012  . CARDIAC CATHETERIZATION  2011  . CARDIAC CATHETERIZATION N/A 11/09/2016   Procedure: Right Heart Cath;  Surgeon: Belva Crome, MD;  Location: [facility];  Service: Cardiovascular;  Laterality: N/A;  . carpel tunnel     left wrist  . CATARACT EXTRACTION W/ INTRAOCULAR LENS  IMPLANT, BILATERAL  2013  . CERVICAL LAMINECTOMY  2012  . COLONOSCOPY N/A 01/14/2013   Procedure: COLONOSCOPY;  Surgeon: Irene Shipper, MD;  Location: [facility];  Service: Endoscopy;   Laterality: N/A;  . EYE SURGERY    . [place]   left  . LUMBAR LAMINECTOMY  2003  . LUNG BIOPSY Left 12/26/2016  Procedure: LUNG BIOPSY;  Surgeon: Loreli Slot, MD;  Location: [facility];  Service: Thoracic;  Laterality: Left;  . POSTERIOR FUSION CERVICAL SPINE  2012  . TRIGGER FINGER RELEASE  2011   4th finger left hand  . VIDEO ASSISTED THORACOSCOPY Left 12/26/2016   Procedure: VIDEO ASSISTED THORACOSCOPY;  Surgeon: Loreli Slot, MD;  Location: [facility] OR;  Service: Thoracic;  Laterality: Left;  Marland Kitchen VIDEO BRONCHOSCOPY N/A 12/26/2016   Procedure: VIDEO BRONCHOSCOPY;  Surgeon: Loreli Slot, MD;  Location: [facility] OR;  Service: Thoracic;  Laterality: N/A;    Allergies  Allergen Reactions  . Codeine Hives and Itching  . Other Other (See Comments)    Esbriet causes elevated LFTs. D/C on 06/14/17    Immunization History  Administered Date(s) Administered  . Influenza Split 08/14/2017  . Influenza, High Dose Seasonal PF 08/05/2016  . Pneumococcal-Unspecified 12/05/2014    Family History  Problem Relation Age of Onset  . Diabetes Mellitus II Mother   . Emphysema Father 37  . Colon cancer Neg Hx   . Esophageal cancer Neg Hx   . Rectal cancer Neg Hx   . Stomach cancer Neg Hx      Current Outpatient Prescriptions:  .  Nintedanib (OFEV) 150 MG CAPS, Take 150 mg by mouth 2 (two) times daily., Disp: , Rfl:  .  Ascorbic Acid (VITAMIN C) 1000 MG tablet, Take 1,000 mg by mouth daily., Disp: , Rfl:  .  aspirin 81 MG tablet, Take 81 mg by mouth daily.  , Disp: , Rfl:  .  clotrimazole-betamethasone (LOTRISONE) cream, APPLY TO AFFECTED AREA TWICE A DAY, Disp: , Rfl: 0 .  diazepam (VALIUM) 5 MG tablet, Take 5 mg by mouth at bedtime as needed (for sleep). , Disp: , Rfl:  .  furosemide (LASIX) 40 MG tablet, Take 40 mg by mouth daily. , Disp: , Rfl:  .  insulin detemir (LEVEMIR) 100 UNIT/ML injection, Inject 22 Units into the skin at bedtime., Disp: , Rfl:  .  insulin lispro  (HUMALOG KWIKPEN) 100 UNIT/ML injection, Inject 20 Units into the skin 3 (three) times daily before meals. Sliding scale, Disp: , Rfl:  .  Insulin Pump Disposable (V-GO 40) KIT, 40 Units by Does not apply route daily. humalog 100 units dispensed every 24 hours. Dispose v-go bag after 24 hours of use. , Disp: , Rfl:  .  KLOR-CON M20 20 MEQ tablet, Take 20 mEq by mouth daily. , Disp: , Rfl:  .  lisinopril (PRINIVIL,ZESTRIL) 20 MG tablet, Take 20 mg by mouth daily. , Disp: , Rfl:  .  Multiple Vitamin (MULTIVITAMIN) tablet, Take 1 tablet by mouth daily.  , Disp: , Rfl:  .  nebivolol (BYSTOLIC) 5 MG tablet, Take 5 mg by mouth daily., Disp: , Rfl:  .  pantoprazole (PROTONIX) 40 MG tablet, Take 40 mg by mouth daily.  , Disp: , Rfl:  .  PROAIR HFA 108 (90 Base) MCG/ACT inhaler, Inhale 2 puffs into the lungs every 4 (four) hours as needed for wheezing or shortness of breath. , Disp: , Rfl:  .  promethazine-phenylephrine (PROMETHAZINE VC PLAIN) 6.25-5 MG/5ML SYRP, Take 5 mLs by mouth every 4 (four) hours as needed for congestion., Disp: , Rfl:      Review of Systems     Objective:   Physical Exam  Constitutional: He is oriented to person, place, and time. He appears well-developed and well-nourished. No distress.  HENT:  Head: Normocephalic and atraumatic.  Right Ear: External  ear normal.  Left Ear: External ear normal.  Mouth/Throat: Oropharynx is clear and moist. No oropharyngeal exudate.  Eyes: Pupils are equal, round, and reactive to light. Conjunctivae and EOM are normal. Right eye exhibits no discharge. Left eye exhibits no discharge. No scleral icterus.  Neck: Normal range of motion. Neck supple. No JVD present. No tracheal deviation present. No thyromegaly present.  Cardiovascular: Normal rate, regular rhythm and intact distal pulses.  Exam reveals no gallop and no friction rub.   No murmur heard. Pulmonary/Chest: Effort normal. No respiratory distress. He has no wheezes. He has rales. He  exhibits no tenderness.  Abdominal: Soft. Bowel sounds are normal. He exhibits no distension and no mass. There is no tenderness. There is no rebound and no guarding.  Musculoskeletal: Normal range of motion. He exhibits no edema or tenderness.  Lymphadenopathy:    He has no cervical adenopathy.  Neurological: He is alert and oriented to person, place, and time. He has normal reflexes. No cranial nerve deficit. Coordination normal.  Skin: Skin is warm and dry. No rash noted. He is not diaphoretic. No erythema. No pallor.  Psychiatric: He has a normal mood and affect. His behavior is normal. Judgment and thought content normal.  Nursing note and vitals reviewed.    Vitals:   10/04/17 1010  BP: 124/64  Pulse: 75  SpO2: 94%  Weight: 262 lb (118.8 kg)  Height: '6\' 1"'$  (1.854 m)    Estimated body mass index is 34.57 kg/m as calculated from the following:   Height as of this encounter: '6\' 1"'$  (1.854 m).   Weight as of this encounter: 262 lb (118.8 kg).  Perform a    Assessment:       ICD-10-CM   1. 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[place]) J84.112   2. Encounter for therapeutic drug monitoring Z51.81        Plan:      IPF continues to be stable in the last 1 year Anti-fibrotic therapies helping you Understanding you have mild but tolerable diarrhea of Ofev I do not understand the episodic shortness of breath episodes at night while sitting  Plan - continue Ofev - check liver function test today 10/04/2017  - contnue participation with IPF registry - will refer to a research program called [employer] - given you consent copy to read over  - it will be a month or two before we call you for it -try pepcid OTC and see if episodes go away - if it gets worse call us - glad rehab is helping you - continue it  Followup  - 1 month do another LFT check - wil call with results - in 3-4 months do Pre-bd spiro and dlco only. No lung volume or bd response. No post-bd spiro - return to  see me in 3-4 months or sooner if needed but after spirometry  Dr. Brand Males, M.D., [facility].C.P Pulmonary and Critical Care Medicine Staff Physician [place] [facility] Pager: [phone], If no answer or between  15:00h - 7:00h: call [phone]  10/04/2017 10:54 AM

## 2017-10-04 NOTE — Patient Instructions (Signed)
ICD-10-CM   1. 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[place]) J84.112   2. Encounter for therapeutic drug monitoring Z51.81    IPF continues to be stable in the last 1 year Anti-fibrotic therapies helping you Understanding you have mild but tolerable diarrhea of Ofev I do not understand the episodic shortness of breath episodes at night while sitting  Plan - continue Ofev - check liver function test today 10/04/2017 - will refer to a research program called [employer] - given you consent copy to read over  - it will be a month or two before we call you for it -try pepcid OTC and see if episodes go away - if it gets worse call us - glad rehab is helping you - continue it  Followup  - 1 month do another LFT check - wil call with results - in 3-4 months do Pre-bd spiro and dlco only. No lung volume or bd response. No post-bd spiro - return to see me in 3-4 months or sooner if needed but after spirometry

## 2017-10-05 ENCOUNTER — Encounter (HOSPITAL_COMMUNITY)
Admission: RE | Admit: 2017-10-05 | Discharge: 2017-10-05 | Disposition: A | Discharge status: Home / Self Care | Payer: Self-pay | Source: Ambulatory Visit | Attending: Internal Medicine | Admitting: Internal Medicine

## 2017-10-05 DIAGNOSIS — Z7982 Long term (current) use of aspirin: Secondary | ICD-10-CM | POA: Insufficient documentation

## 2017-10-05 DIAGNOSIS — E785 Hyperlipidemia, unspecified: Secondary | ICD-10-CM | POA: Insufficient documentation

## 2017-10-05 DIAGNOSIS — I129 Hypertensive chronic kidney disease with stage 1 through stage 4 chronic kidney disease, or unspecified chronic kidney disease: Secondary | ICD-10-CM | POA: Insufficient documentation

## 2017-10-05 DIAGNOSIS — E1122 Type 2 diabetes mellitus with diabetic chronic kidney disease: Secondary | ICD-10-CM | POA: Insufficient documentation

## 2017-10-05 DIAGNOSIS — J84112 Idiopathic pulmonary fibrosis: Secondary | ICD-10-CM | POA: Insufficient documentation

## 2017-10-05 DIAGNOSIS — G4730 Sleep apnea, unspecified: Secondary | ICD-10-CM | POA: Insufficient documentation

## 2017-10-05 DIAGNOSIS — N189 Chronic kidney disease, unspecified: Secondary | ICD-10-CM | POA: Insufficient documentation

## 2017-10-05 DIAGNOSIS — Z79899 Other long term (current) drug therapy: Secondary | ICD-10-CM | POA: Insufficient documentation

## 2017-10-05 DIAGNOSIS — Z794 Long term (current) use of insulin: Secondary | ICD-10-CM | POA: Insufficient documentation

## 2017-10-05 DIAGNOSIS — K219 Gastro-esophageal reflux disease without esophagitis: Secondary | ICD-10-CM | POA: Insufficient documentation

## 2017-10-05 NOTE — Progress Notes (Signed)
Spoke with pt and notified of results per MR. Pt verbalized understanding and denied any questions.

## 2017-10-10 ENCOUNTER — Encounter (HOSPITAL_COMMUNITY)
Admission: RE | Admit: 2017-10-10 | Discharge: 2017-10-10 | Disposition: A | Discharge status: Home / Self Care | Payer: Self-pay | Source: Ambulatory Visit | Attending: Internal Medicine | Admitting: Internal Medicine

## 2017-10-12 ENCOUNTER — Encounter (HOSPITAL_COMMUNITY)
Admission: RE | Admit: 2017-10-12 | Discharge: 2017-10-12 | Disposition: A | Discharge status: Home / Self Care | Payer: Self-pay | Source: Ambulatory Visit | Attending: Internal Medicine | Admitting: Internal Medicine

## 2017-10-17 ENCOUNTER — Encounter (HOSPITAL_COMMUNITY)
Admission: RE | Admit: 2017-10-17 | Discharge: 2017-10-17 | Disposition: A | Discharge status: Home / Self Care | Payer: Self-pay | Source: Ambulatory Visit | Attending: Internal Medicine | Admitting: Internal Medicine

## 2017-10-19 ENCOUNTER — Encounter (HOSPITAL_COMMUNITY)
Admission: RE | Admit: 2017-10-19 | Discharge: 2017-10-19 | Disposition: A | Discharge status: Home / Self Care | Payer: Medicare Other | Source: Ambulatory Visit | Attending: Internal Medicine | Admitting: Internal Medicine

## 2017-10-24 ENCOUNTER — Encounter (HOSPITAL_COMMUNITY)
Admission: RE | Admit: 2017-10-24 | Discharge: 2017-10-24 | Disposition: A | Discharge status: Home / Self Care | Payer: Self-pay | Source: Ambulatory Visit | Attending: Internal Medicine | Admitting: Internal Medicine

## 2017-10-31 ENCOUNTER — Encounter (HOSPITAL_COMMUNITY)
Admission: RE | Admit: 2017-10-31 | Discharge: 2017-10-31 | Disposition: A | Discharge status: Home / Self Care | Payer: Self-pay | Source: Ambulatory Visit | Attending: Internal Medicine | Admitting: Internal Medicine

## 2017-11-02 ENCOUNTER — Encounter (HOSPITAL_COMMUNITY)
Admission: RE | Admit: 2017-11-02 | Discharge: 2017-11-02 | Disposition: A | Discharge status: Home / Self Care | Payer: Self-pay | Source: Ambulatory Visit | Attending: Internal Medicine | Admitting: Internal Medicine

## 2017-11-07 ENCOUNTER — Encounter (HOSPITAL_COMMUNITY)
Admission: RE | Admit: 2017-11-07 | Discharge: 2017-11-07 | Disposition: A | Discharge status: Home / Self Care | Payer: Self-pay | Source: Ambulatory Visit | Attending: Internal Medicine | Admitting: Internal Medicine

## 2017-11-07 DIAGNOSIS — E785 Hyperlipidemia, unspecified: Secondary | ICD-10-CM | POA: Insufficient documentation

## 2017-11-07 DIAGNOSIS — Z794 Long term (current) use of insulin: Secondary | ICD-10-CM | POA: Insufficient documentation

## 2017-11-07 DIAGNOSIS — N189 Chronic kidney disease, unspecified: Secondary | ICD-10-CM | POA: Insufficient documentation

## 2017-11-07 DIAGNOSIS — E1122 Type 2 diabetes mellitus with diabetic chronic kidney disease: Secondary | ICD-10-CM | POA: Insufficient documentation

## 2017-11-07 DIAGNOSIS — J84112 Idiopathic pulmonary fibrosis: Secondary | ICD-10-CM | POA: Insufficient documentation

## 2017-11-07 DIAGNOSIS — Z7982 Long term (current) use of aspirin: Secondary | ICD-10-CM | POA: Insufficient documentation

## 2017-11-07 DIAGNOSIS — Z79899 Other long term (current) drug therapy: Secondary | ICD-10-CM | POA: Insufficient documentation

## 2017-11-07 DIAGNOSIS — G4730 Sleep apnea, unspecified: Secondary | ICD-10-CM | POA: Insufficient documentation

## 2017-11-07 DIAGNOSIS — I129 Hypertensive chronic kidney disease with stage 1 through stage 4 chronic kidney disease, or unspecified chronic kidney disease: Secondary | ICD-10-CM | POA: Insufficient documentation

## 2017-11-07 DIAGNOSIS — K219 Gastro-esophageal reflux disease without esophagitis: Secondary | ICD-10-CM | POA: Insufficient documentation

## 2017-11-08 ENCOUNTER — Telehealth: Payer: Self-pay | Admitting: Internal Medicine

## 2017-11-08 DIAGNOSIS — J84112 Idiopathic pulmonary fibrosis: Secondary | ICD-10-CM

## 2017-11-08 NOTE — Telephone Encounter (Signed)
I asked the operator to page her and she was unable to find her and stated that she may be at lunch. Advised her to have [name] call us back.

## 2017-11-08 NOTE — Telephone Encounter (Signed)
I called [name] at work and was unable to reach her.

## 2017-11-08 NOTE — Telephone Encounter (Signed)
Spoke with Mrs. Soria and advised her of MR message. She agreed to try the OFEV 100mg  to see if he tolerates this dose better. I called [facility] because they are getting ready to ship the 150 mg on Wednesday. I call twice to speak with a representative and was disconnected for some reason.   [phone] Jerene Pitch  I spoke with the pharmacist and changed the Rx to [facility] 100mg  and they stated they will ship this out as soon as possible. In the meanwhile, pt's wife wants to know if pt should just continue the OFEV at 130 mg until they receive the 100mg ? MR please advise.

## 2017-11-08 NOTE — Telephone Encounter (Signed)
Gosh yeah - ofev is causing diarrhea.   Plan - ofev holiday 5 days - then take ofev 150mg  on one day and alternate next day with 150mg  bid (avg dose of 100mg  twice daily) - meanwhile, please apply to company and get him on the 100mg  bid dosing - check LFT this week if it has been more than 1 month since prior check (hge had abnormal lft with esbriet) - let them know if ofev does not work then we consider a drug trial  Dr. Brand Males, M.D., [facility].C.P Pulmonary and Critical Care Medicine Staff Physician, [place] Director - Interstitial Lung Disease  Program  [facility] [place] [facility] [place], [place], [address]  Pager: [phone], If no answer or between  15:00h - 7:00h: call [phone] Telephone: [phone]

## 2017-11-08 NOTE — Telephone Encounter (Signed)
Spoke with the pt's spouse  She reports that despite taking pepcid the pt is still bothered by diarrhea  She states he has the diarrhea about every other day and that it is so severe you can hear his stomach rumbling and there is an abnormally foul odor  She states he does take immodium, but this only helps minimally  She wonders if decreasing his Ofev from 150 to 100 mg would help  Please advise thanks!

## 2017-11-09 ENCOUNTER — Telehealth: Payer: Self-pay | Admitting: Internal Medicine

## 2017-11-09 ENCOUNTER — Encounter (HOSPITAL_COMMUNITY)
Admission: RE | Admit: 2017-11-09 | Discharge: 2017-11-09 | Disposition: A | Discharge status: Home / Self Care | Payer: Self-pay | Source: Ambulatory Visit | Attending: Internal Medicine | Admitting: Internal Medicine

## 2017-11-09 MED ORDER — NINTEDANIB ESYLATE 100 MG PO CAPS: 100.0000 mg | ORAL_CAPSULE | Freq: Two times a day (BID) | ORAL | 3 refills | Status: DC

## 2017-11-09 NOTE — Telephone Encounter (Signed)
ATC pt, no answer. Left message for pt to call back.  

## 2017-11-09 NOTE — Telephone Encounter (Signed)
Called and spoke with pts wife again and she stated that when she was on the phone one of the providers in her office came in and she wanted to make sure what she heard from TA was the correct thing since she couldn't hear her good.  She had the correct information and nothing further is needed.

## 2017-11-09 NOTE — Telephone Encounter (Signed)
Should still do holiday for of ofev 5-7 days all together

## 2017-11-09 NOTE — Telephone Encounter (Addendum)
Advised Mrs. Branaman to take a 5 day holiday from the [facility]. [name] verbalized understanding.

## 2017-11-10 ENCOUNTER — Other Ambulatory Visit (INDEPENDENT_AMBULATORY_CARE_PROVIDER_SITE_OTHER): Payer: Medicare Other

## 2017-11-10 DIAGNOSIS — J84112 Idiopathic pulmonary fibrosis: Secondary | ICD-10-CM | POA: Diagnosis not present

## 2017-11-10 LAB — HEPATIC FUNCTION PANEL
ALT: 26 U/L (ref 0–53)
AST: 23 U/L (ref 0–37)
Albumin: 3.8 g/dL (ref 3.5–5.2)
Alkaline Phosphatase: 81 U/L (ref 39–117)
Bilirubin, Direct: 0 mg/dL (ref 0.0–0.3)
Total Bilirubin: 0.6 mg/dL (ref 0.2–1.2)
Total Protein: 7.3 g/dL (ref 6.0–8.3)

## 2017-11-14 ENCOUNTER — Encounter (HOSPITAL_COMMUNITY): Payer: Self-pay

## 2017-11-16 ENCOUNTER — Encounter (HOSPITAL_COMMUNITY)
Admission: RE | Admit: 2017-11-16 | Discharge: 2017-11-16 | Disposition: A | Discharge status: Home / Self Care | Payer: Self-pay | Source: Ambulatory Visit | Attending: Internal Medicine | Admitting: Internal Medicine

## 2017-11-21 ENCOUNTER — Encounter (HOSPITAL_COMMUNITY)
Admission: RE | Admit: 2017-11-21 | Discharge: 2017-11-21 | Disposition: A | Discharge status: Home / Self Care | Payer: Self-pay | Source: Ambulatory Visit | Attending: Internal Medicine | Admitting: Internal Medicine

## 2017-11-23 ENCOUNTER — Encounter (HOSPITAL_COMMUNITY)
Admission: RE | Admit: 2017-11-23 | Discharge: 2017-11-23 | Disposition: A | Discharge status: Home / Self Care | Payer: Self-pay | Source: Ambulatory Visit | Attending: Internal Medicine | Admitting: Internal Medicine

## 2017-11-30 ENCOUNTER — Encounter (HOSPITAL_COMMUNITY)
Admission: RE | Admit: 2017-11-30 | Discharge: 2017-11-30 | Disposition: A | Discharge status: Home / Self Care | Payer: Self-pay | Source: Ambulatory Visit | Attending: Internal Medicine | Admitting: Internal Medicine

## 2017-12-07 ENCOUNTER — Encounter (HOSPITAL_COMMUNITY)
Admission: RE | Admit: 2017-12-07 | Discharge: 2017-12-07 | Disposition: A | Discharge status: Home / Self Care | Payer: Self-pay | Source: Ambulatory Visit | Attending: Internal Medicine | Admitting: Internal Medicine

## 2017-12-07 DIAGNOSIS — Z794 Long term (current) use of insulin: Secondary | ICD-10-CM | POA: Insufficient documentation

## 2017-12-07 DIAGNOSIS — K219 Gastro-esophageal reflux disease without esophagitis: Secondary | ICD-10-CM | POA: Insufficient documentation

## 2017-12-07 DIAGNOSIS — N189 Chronic kidney disease, unspecified: Secondary | ICD-10-CM | POA: Insufficient documentation

## 2017-12-07 DIAGNOSIS — E1122 Type 2 diabetes mellitus with diabetic chronic kidney disease: Secondary | ICD-10-CM | POA: Insufficient documentation

## 2017-12-07 DIAGNOSIS — G4730 Sleep apnea, unspecified: Secondary | ICD-10-CM | POA: Insufficient documentation

## 2017-12-07 DIAGNOSIS — E785 Hyperlipidemia, unspecified: Secondary | ICD-10-CM | POA: Insufficient documentation

## 2017-12-07 DIAGNOSIS — Z79899 Other long term (current) drug therapy: Secondary | ICD-10-CM | POA: Insufficient documentation

## 2017-12-07 DIAGNOSIS — Z7982 Long term (current) use of aspirin: Secondary | ICD-10-CM | POA: Insufficient documentation

## 2017-12-07 DIAGNOSIS — I129 Hypertensive chronic kidney disease with stage 1 through stage 4 chronic kidney disease, or unspecified chronic kidney disease: Secondary | ICD-10-CM | POA: Insufficient documentation

## 2017-12-07 DIAGNOSIS — J84112 Idiopathic pulmonary fibrosis: Secondary | ICD-10-CM | POA: Insufficient documentation

## 2017-12-12 ENCOUNTER — Encounter (HOSPITAL_COMMUNITY): Payer: Self-pay

## 2017-12-14 ENCOUNTER — Encounter (HOSPITAL_COMMUNITY)
Admission: RE | Admit: 2017-12-14 | Discharge: 2017-12-14 | Disposition: A | Discharge status: Home / Self Care | Payer: Self-pay | Source: Ambulatory Visit | Attending: Internal Medicine | Admitting: Internal Medicine

## 2017-12-15 ENCOUNTER — Telehealth (HOSPITAL_COMMUNITY): Payer: Self-pay | Admitting: *Deleted

## 2017-12-15 NOTE — Progress Notes (Signed)
Nutrition Note Spoke with pt's wife over the phone. Pt's wife concerned re: pt's diarrhea on OFEV. Pt has tried a modified version of the [employer] (pt does not like applesauce or rice) for a couple of weeks without success. Diarrhea Nutrition Therapy discussed. Alternative ways to manage diarrhea discussed. Pt's wife is concerned pt is retaining fluid. Pt is reportedly not losing wt with decreased diarrhea. Per discussion, pt diet is very low in protein. Pt does not like chicken, pork or steak. Pt will eat fish, dark meat [place], and hamburgers. Suspect pt may be third spacing. Pt's wife encouraged to add protein supplements to pt's meals (e.g. Premier Protein shakes, Evolve shakes, Glucerna, Unjury protein shakes/powder, Whey protein powder). Pt is diabetic as well. Pt's CBG's have reportedly fluctuated significantly. Pt has SSI to help decrease fluctuations. Pt reportedly has had several recent episodes of nocturnal hypoglycemia. Pt's wife stated they were treating hypoglycemia (e.g. CBG of 60 mg/dL) with a peanut butter and jelly sandwich. Hypoglycemia treatment protocol/ "the 15/15 rule" reviewed with pt's wife. Pt's wife expressed understanding of the information reviewed by feedback method. Continue client-centered nutrition education by RD as part of interdisciplinary care.  Monitor and evaluate progress toward nutrition goal with team.  Derek Mound, M.Ed, RD, LDN, CDE 12/15/2017 12:09 PM  Handout given to patient's wife.   Things to Know That May Help Decrease Frequency of Diarrhea People with diarrhea should avoid certain foods that trigger or worsen symptoms. These include: Marland Kitchen Milk and other dairy products . Caffeine . Greasy and fatty foods . Spicy foods . Alcohol Some foods are better tolerated, such as: Adrian Saran  . Boiled potatoes . Cooked carrots . Cooked chicken . Plain rice or pasta . Toast Although avoiding high-fiber foods during a bout of diarrhea can ease symptoms, fiber can  help reduce the severity of chronic diarrhea (such as that caused by OFEV) by adding bulk to stools. Some patients benefit from adding high fiber foods (e.g. brown rice, whole grain bread or cereals) or a fiber supplement (e.g. Metamucil). When adding fiber to the diet, start slowly and gradually increase fiber consumption. In general, women need 25-30 grams of fiber daily and men need 30-35 grams of fiber daily.  Alternative Therapies that may help decrease diarrhea episodes  . Follow the "BRAT" diet, an acronym for bananas, rice, applesauce and toast. These foods should be added to the diet; they should not replace a normal, well-balanced diet. . Probiotics help repopulate the digestive tract with healthy bacteria. Take one tablespoon of the liquid culture or one to two capsules with meals unless the label directs otherwise. . It is particularly important to take probiotics twice a day, with meals, as soon as you begin taking antibiotics. Continue the twice-daily ingestion of probiotics for a few days after completing the course of antibiotics. . Eating 2 coconut macaroons a day has been beneficial for patients with Inflammatory Bowel Disease and may be worth trying. You can also try other sources of coconut (e.g. coconut yogurt). Try and choose the least processed version of coconut you like.  . Carob powder can help soothe irritated intestines. Start with one tablespoon, mixed with some applesauce and honey to make it palatable. Take it on an empty stomach. Carolynn Sayers root bark (Rubus macropetalus) contains tannins, which have a desirable astringent action on the intestinal lining. Take a teaspoon of tincture in water every two to four hours. . If you have excessive gas with diarrhea, try taking Activated  Charcoal capsules. Take 2 capsules with water right before meals and as needed after eating problem foods. Repeat after 2 hours if necessary. Do not exceed 16 capsules per day.  Allow at least 2 hours  before or after taking any medication. May cause temporary darkening of the stool.  Always check with your MD or Pharmacist before taking any over the counter supplement to make sure there is not a supplement/medication interaction.  Source: PokerClues.dk

## 2017-12-19 ENCOUNTER — Encounter (HOSPITAL_COMMUNITY): Payer: Self-pay

## 2017-12-19 ENCOUNTER — Emergency Department (HOSPITAL_COMMUNITY): Payer: Medicare Other

## 2017-12-19 ENCOUNTER — Other Ambulatory Visit: Payer: Self-pay

## 2017-12-19 ENCOUNTER — Emergency Department (HOSPITAL_COMMUNITY)
Admission: EM | Admit: 2017-12-19 | Discharge: 2017-12-19 | Disposition: A | Discharge status: Home / Self Care | Payer: Medicare Other | Attending: Emergency Medicine | Admitting: Emergency Medicine

## 2017-12-19 DIAGNOSIS — N189 Chronic kidney disease, unspecified: Secondary | ICD-10-CM | POA: Insufficient documentation

## 2017-12-19 DIAGNOSIS — I129 Hypertensive chronic kidney disease with stage 1 through stage 4 chronic kidney disease, or unspecified chronic kidney disease: Secondary | ICD-10-CM | POA: Insufficient documentation

## 2017-12-19 DIAGNOSIS — Z794 Long term (current) use of insulin: Secondary | ICD-10-CM | POA: Diagnosis not present

## 2017-12-19 DIAGNOSIS — E1122 Type 2 diabetes mellitus with diabetic chronic kidney disease: Secondary | ICD-10-CM | POA: Diagnosis not present

## 2017-12-19 DIAGNOSIS — Z7982 Long term (current) use of aspirin: Secondary | ICD-10-CM | POA: Diagnosis not present

## 2017-12-19 DIAGNOSIS — Z79899 Other long term (current) drug therapy: Secondary | ICD-10-CM | POA: Insufficient documentation

## 2017-12-19 DIAGNOSIS — R0789 Other chest pain: Secondary | ICD-10-CM | POA: Insufficient documentation

## 2017-12-19 DIAGNOSIS — J84112 Idiopathic pulmonary fibrosis: Secondary | ICD-10-CM | POA: Insufficient documentation

## 2017-12-19 DIAGNOSIS — R079 Chest pain, unspecified: Secondary | ICD-10-CM | POA: Diagnosis present

## 2017-12-19 DIAGNOSIS — R0602 Shortness of breath: Secondary | ICD-10-CM | POA: Diagnosis not present

## 2017-12-19 LAB — BASIC METABOLIC PANEL
Anion gap: 11 (ref 5–15)
BUN: 21 mg/dL — ABNORMAL HIGH (ref 6–20)
CO2: 20 mmol/L — ABNORMAL LOW (ref 22–32)
Calcium: 8.3 mg/dL — ABNORMAL LOW (ref 8.9–10.3)
Chloride: 103 mmol/L (ref 101–111)
Creatinine, Ser: 1.34 mg/dL — ABNORMAL HIGH (ref 0.61–1.24)
GFR calc Af Amer: 58 mL/min — ABNORMAL LOW (ref >60)
GFR calc non Af Amer: 50 mL/min — ABNORMAL LOW (ref >60)
Glucose, Bld: 134 mg/dL — ABNORMAL HIGH (ref 65–99)
Potassium: 4.6 mmol/L (ref 3.5–5.1)
Sodium: 134 mmol/L — ABNORMAL LOW (ref 135–145)

## 2017-12-19 LAB — CBC
HCT: 36.9 % — ABNORMAL LOW (ref 39.0–52.0)
Hemoglobin: 12.7 g/dL — ABNORMAL LOW (ref 13.0–17.0)
MCH: 32.6 pg (ref 26.0–34.0)
MCHC: 34.4 g/dL (ref 30.0–36.0)
MCV: 94.6 fL (ref 78.0–100.0)
Platelets: 210 10*3/uL (ref 150–400)
RBC: 3.9 MIL/uL — ABNORMAL LOW (ref 4.22–5.81)
RDW: 12.8 % (ref 11.5–15.5)
WBC: 9 10*3/uL (ref 4.0–10.5)

## 2017-12-19 LAB — I-STAT TROPONIN, ED
Troponin i, poc: 0 ng/mL (ref 0.00–0.08)
Troponin i, poc: 0 ng/mL (ref 0.00–0.08)

## 2017-12-19 LAB — BRAIN NATRIURETIC PEPTIDE: B Natriuretic Peptide: 151.5 pg/mL — ABNORMAL HIGH (ref 0.0–100.0)

## 2017-12-19 LAB — D-DIMER, QUANTITATIVE: D-Dimer, Quant: 0.5 ug/mL-FEU (ref 0.00–0.50)

## 2017-12-19 NOTE — ED Triage Notes (Addendum)
Per [facility], pt had chest discomfort starting before bedtime and woke up around 0130 with sharp midsternal CP rated 9/10. Pt reports pain got worse and wife give him 2 nitro and 324 of ASA prior to EMS arrival. Pt now reports pain is 4/10. Pt reports pain increases with movement and deep breathing. Pt reports always having non-productive cough and hx of fibrosis of the lungs, but breathing feels more difficult than normal. Pt is alert and oriented. Pt has hx of DM and has insulin pump on left side. Pt reports always dizzy while standing.

## 2017-12-19 NOTE — ED Provider Notes (Addendum)
[facility] EMERGENCY DEPARTMENT Provider Note   CSN: [id number] Arrival date & time: 12/19/17  0414     History   Chief Complaint Chief Complaint  Patient presents with  . Chest Pain    HPI Shane [name] is a 77 y.o. male.  Patient presents to the emergency department for evaluation of chest pain.  Patient reports that symptoms began before bedtime but he was awakened around 1:30 AM with sharp stabbing pains in the center of the chest.  Patient reports that he noticed that the pain worsens when he took a deep breath, causing him to be unable to take deep breaths.  It also was painful to move such as bending and twisting of his torso.  Wife gave him aspirin and nitroglycerin, he thinks there was some slight improvement with nitroglycerin.  He does, however, report that he has continued to slowly improve and his pain is only very slight currently.  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  He has not had a fever.      Past Medical History:  Diagnosis Date  . Cancer ([place])    ling  . Cataract   . Chest pain   . Chronic kidney disease    d/t DM  . Diabetes mellitus    Vgo disposal insulin bolus  simular to insulin pump  . Dyspnea   . GERD (gastroesophageal reflux disease)   . Hyperlipidemia   . Hypertension   . Neuromuscular disorder ([place])   . PONV (postoperative nausea and vomiting)   . Sleep apnea     uses cpap asked to bring mask and tubing    Patient Active Problem List   Diagnosis Date Noted  . Encounter for therapeutic drug monitoring 06/29/2017  . Obstructive sleep apnea 05/05/2017  . Cancer ([place])   . 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 Abnormal chest x-ray 10/12/2016  . 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 ANTERIOR FUSION CERVICAL SPINE  2012  . CARDIAC CATHETERIZATION  2011    . CARDIAC CATHETERIZATION N/A 11/09/2016   Procedure: Right Heart Cath;  Surgeon: Belva Crome, MD;  Location: [facility];  Service: Cardiovascular;  Laterality: N/A;  . carpel tunnel     left wrist  . CATARACT EXTRACTION W/ INTRAOCULAR LENS  IMPLANT, BILATERAL  2013  . CERVICAL LAMINECTOMY  2012  . COLONOSCOPY N/A 01/14/2013   Procedure: COLONOSCOPY;  Surgeon: Irene Shipper, MD;  Location: [facility];  Service: Endoscopy;  Laterality: N/A;  . EYE SURGERY    . [place]   left  . LUMBAR LAMINECTOMY  2003  . LUNG BIOPSY Left 12/26/2016   Procedure: LUNG BIOPSY;  Surgeon: [name], MD;  Location: [place];  Service: Thoracic;  Laterality: Left;  . POSTERIOR FUSION CERVICAL SPINE  2012  . TRIGGER FINGER RELEASE  2011   4th finger left hand  . VIDEO ASSISTED THORACOSCOPY Left 12/26/2016   Procedure: VIDEO ASSISTED THORACOSCOPY;  Surgeon: [name], MD;  Location: [place];  Service: Thoracic;  Laterality: Left;  Marland Kitchen VIDEO BRONCHOSCOPY N/A 12/26/2016   Procedure: VIDEO BRONCHOSCOPY;  Surgeon: [name], MD;  Location: [place];  Service: Thoracic;  Laterality: N/A;       Home Medications    Prior to Admission medications  Medication Sig Start Date End Date Taking? Authorizing Provider  Ascorbic Acid (VITAMIN C) 1000 MG tablet Take 1,000 mg by mouth daily.   Yes [provider]  aspirin 81 MG tablet Take 81 mg by mouth daily.     Yes [provider]  clotrimazole-betamethasone (LOTRISONE) cream APPLY TO AFFECTED AREA TWICE A DAY 01/16/17  Yes [provider]  diazepam (VALIUM) 5 MG tablet Take 5 mg by mouth at bedtime as needed (for sleep).    Yes [provider]  furosemide (LASIX) 40 MG tablet Take 40 mg by mouth daily.  09/26/16  Yes [provider]  insulin detemir (LEVEMIR) 100 UNIT/ML injection Inject 22 Units into the skin at bedtime.   Yes [provider]  insulin lispro (HUMALOG  KWIKPEN) 100 UNIT/ML injection Inject 20 Units into the skin 3 (three) times daily before meals. Sliding scale   Yes [provider]  Insulin Pump Disposable (V-GO 40) KIT 40 Units by Does not apply route daily. humalog 100 units dispensed every 24 hours. Dispose v-go bag after 24 hours of use.    Yes [provider]  KLOR-CON M20 20 MEQ tablet Take 20 mEq by mouth daily.  09/26/16  Yes [provider]  lisinopril (PRINIVIL,ZESTRIL) 20 MG tablet Take 20 mg by mouth daily.  09/08/16  Yes [provider]  Multiple Vitamin (MULTIVITAMIN) tablet Take 1 tablet by mouth daily.     Yes [provider]  nebivolol (BYSTOLIC) 5 MG tablet Take 5 mg by mouth daily.   Yes [provider]  Nintedanib (OFEV) 100 MG CAPS Take 1 capsule (100 mg total) by mouth 2 (two) times daily. 11/09/17  Yes Brand Males, MD  pantoprazole (PROTONIX) 40 MG tablet Take 40 mg by mouth daily.     Yes [provider]  PROAIR HFA 108 (90 Base) MCG/ACT inhaler Inhale 2 puffs into the lungs every 4 (four) hours as needed for wheezing or shortness of breath.  10/03/16  Yes [provider]  promethazine-phenylephrine (PROMETHAZINE VC PLAIN) 6.25-5 MG/5ML SYRP Take 5 mLs by mouth every 4 (four) hours as needed for congestion.   Yes [provider]    Family History Family History  Problem Relation Age of Onset  . Diabetes Mellitus II Mother   . Emphysema Father 79  . Colon cancer Neg Hx   . Esophageal cancer Neg Hx   . Rectal cancer Neg Hx   . Stomach cancer Neg Hx     Social History Social History   Tobacco Use  . Smoking status: Never Smoker  . Smokeless tobacco: Never Used  Substance Use Topics  . Alcohol use: No  . Drug use: No     Allergies   Codeine and Other   Review of Systems Review of Systems  Respiratory: Positive for cough and shortness of breath.   Cardiovascular: Positive for chest pain.  All other systems reviewed and  are negative.    Physical Exam Updated Vital Signs BP (!) 112/59   Pulse (!) 55   Temp 98.4 F (36.9 C) (Oral)   Resp 15   Ht 5' 11"  (1.803 m)   Wt 118.8 kg (262 lb)   SpO2 95%   BMI 36.54 kg/m   Physical Exam  Constitutional: He is oriented to person, place, and time. He appears well-developed and well-nourished. No distress.  HENT:  Head: Normocephalic and atraumatic.  Right Ear: Hearing normal.  Left Ear: Hearing normal.  Nose: Nose normal.  Mouth/Throat: Oropharynx is clear and moist and mucous membranes are normal.  Eyes: Conjunctivae and EOM are normal. Pupils are equal, round, and reactive to light.  Neck: Normal range of motion. Neck supple.  Cardiovascular: Regular rhythm, S1 normal and S2 normal. Exam reveals no gallop and no friction rub.  No murmur heard. Pulmonary/Chest: Effort normal. No respiratory distress. He has decreased breath sounds. He exhibits no tenderness.  Abdominal: Soft. Normal appearance and bowel sounds are normal. There is no hepatosplenomegaly. There is no tenderness. There is no rebound, no guarding, no tenderness at McBurney's point and negative Murphy's sign. No hernia.  Musculoskeletal: Normal range of motion.  Neurological: He is alert and oriented to person, place, and time. He has normal strength. No cranial nerve deficit or sensory deficit. Coordination normal. GCS eye subscore is 4. GCS verbal subscore is 5. GCS motor subscore is 6.  Skin: Skin is warm, dry and intact. No rash noted. No cyanosis.  Psychiatric: He has a normal mood and affect. His speech is normal and behavior is normal. Thought content normal.  Nursing note and vitals reviewed.    ED Treatments / Results  Labs (all labs ordered are listed, but only abnormal results are displayed) Labs Reviewed  BASIC METABOLIC PANEL - Abnormal; Notable for the following components:      Result Value   Sodium 134 (*)    CO2 20 (*)    Glucose, Bld 134 (*)    BUN 21 (*)     Creatinine, Ser 1.34 (*)    Calcium 8.3 (*)    GFR calc non Af Amer 50 (*)    GFR calc Af Amer 58 (*)    All other components within normal limits  CBC - Abnormal; Notable for the following components:   RBC 3.90 (*)    Hemoglobin 12.7 (*)    HCT 36.9 (*)    All other components within normal limits  BRAIN NATRIURETIC PEPTIDE - Abnormal; Notable for the following components:   B Natriuretic Peptide 151.5 (*)    All other components within normal limits  D-DIMER, QUANTITATIVE (NOT AT [facility])  I-STAT TROPONIN, ED    EKG  EKG Interpretation  Date/Time:  Tuesday December 19 2017 04:25:02 EST Ventricular Rate:  53 PR Interval:    QRS Duration: 159 QT Interval:  460 QTC Calculation: 432 R Axis:   -20 Text Interpretation:  Sinus rhythm Prolonged PR interval Right bundle branch block No significant change since last tracing Confirmed by Orpah Greek [phone]) on 12/19/2017 4:41:24 AM       Radiology Dg Chest 2 View  Result Date: 12/19/2017 CLINICAL DATA:  77 year old male with chest pain. EXAM: CHEST  2 VIEW COMPARISON:  Chest radiograph dated 11/16/2017 FINDINGS: Diffuse interstitial coarsening compatible with underlying interstitial lung disease. There is mild increased density in the lingula, likely chronic changes. Pneumonia is less likely but not excluded. Clinical correlation is recommended. Bibasilar atelectatic changes/scarring. Left lateral pleural thickening. No pleural effusion or pneumothorax. Stable cardiac silhouette. Postsurgical changes of cervical spine. No acute osseous pathology. IMPRESSION: Diffuse interstitial coarsening and pleuroparenchymal fibrosis. Airspace density in the lingula likely chronic and less likely pneumonia. Clinical correlation is recommended. Electronically Signed   By: Anner Crete M.D.   On: 12/19/2017 05:21    Procedures Procedures (including critical care time)  Medications Ordered in ED Medications - No data to  display   Initial Impression / Assessment and Plan / ED Course  I have reviewed the triage  vital signs and the nursing notes.  Pertinent labs & imaging results that were available during my care of the patient were reviewed by me and considered in my medical decision making (see chart for details).     Patient presents to the emergency department for evaluation of chest pain.  Patient presents with atypical pain.  He had onset of sharp pain while lying in bed.  He noticed that the pain significantly worsened if he moved his shoulders are bent over.  It also, however, worsened with breathing.  This is likely secondary to the movement of the chest wall, as his d-dimer is negative.  He has not hypoxic.  He does not have any tachycardia.  I do not think he has a PE.  His symptoms have slowly resolved over time and he not experiencing any pain.  He did not have significant improvement with nitroglycerin earlier.  Patient has had an EKG which is unchanged from prior.  Troponin is negative x2.  Patient has had previous left heart cardiac catheterization and right heart catheterization.  He did not have any coronary artery disease seen on catheterization.  Patient counseled that he does not require hospitalization at this time, but should follow-up with his primary care doctor and cardiologist for further outpatient testing.  Final Clinical Impressions(s) / ED Diagnoses   Final diagnoses:  Atypical chest pain    ED Discharge Orders    None       P[place], Gwenyth Allegra, MD 12/19/17 [id number]    Orpah Greek, MD 12/19/17 [phone]

## 2017-12-21 ENCOUNTER — Encounter (HOSPITAL_COMMUNITY): Payer: Self-pay

## 2017-12-25 ENCOUNTER — Other Ambulatory Visit (INDEPENDENT_AMBULATORY_CARE_PROVIDER_SITE_OTHER): Payer: Medicare Other

## 2017-12-25 ENCOUNTER — Ambulatory Visit: Payer: Medicare Other | Admitting: Internal Medicine

## 2017-12-25 ENCOUNTER — Ambulatory Visit (INDEPENDENT_AMBULATORY_CARE_PROVIDER_SITE_OTHER): Payer: Medicare Other | Admitting: Internal Medicine

## 2017-12-25 ENCOUNTER — Encounter: Payer: Self-pay | Admitting: Internal Medicine

## 2017-12-25 VITALS — BP 122/60 | HR 67 | Ht 73.0 in | Wt 261.0 lb

## 2017-12-25 DIAGNOSIS — R0789 Other chest pain: Secondary | ICD-10-CM

## 2017-12-25 DIAGNOSIS — J849 Interstitial pulmonary disease, unspecified: Secondary | ICD-10-CM

## 2017-12-25 DIAGNOSIS — Z5181 Encounter for therapeutic drug level monitoring: Secondary | ICD-10-CM

## 2017-12-25 DIAGNOSIS — R630 Anorexia: Secondary | ICD-10-CM | POA: Diagnosis not present

## 2017-12-25 DIAGNOSIS — J84112 Idiopathic pulmonary fibrosis: Secondary | ICD-10-CM

## 2017-12-25 DIAGNOSIS — R059 Cough, unspecified: Secondary | ICD-10-CM

## 2017-12-25 DIAGNOSIS — D649 Anemia, unspecified: Secondary | ICD-10-CM

## 2017-12-25 DIAGNOSIS — R05 Cough: Secondary | ICD-10-CM | POA: Diagnosis not present

## 2017-12-25 LAB — HEPATIC FUNCTION PANEL
ALT: 25 U/L (ref 0–53)
AST: 24 U/L (ref 0–37)
Albumin: 4 g/dL (ref 3.5–5.2)
Alkaline Phosphatase: 66 U/L (ref 39–117)
Bilirubin, Direct: 0.1 mg/dL (ref 0.0–0.3)
Total Bilirubin: 0.7 mg/dL (ref 0.2–1.2)
Total Protein: 8.1 g/dL (ref 6.0–8.3)

## 2017-12-25 NOTE — Progress Notes (Signed)
PFT completed today.  

## 2017-12-25 NOTE — Patient Instructions (Addendum)
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[place])  - mildy progressive since Nov 2017  - do HRCT next few weeks (supine and prone per protocol) - continue rehab  - continue ofev 100mg  (lower dose) twice daily - use hycodan as first line for shortness of breath and use ativan very sparingly only as 2nd line - continue rehab - given copy of ICF for [facility] study; will have [facility] contact you next few weeks  Cough Atypical chest pain  - probably acute bronchitis or progressive IPF related  - probably lisinopril playing a role    - - talk top PCP Burnard Bunting, MD and stop lisinopril; he can increase bystolic or put you on another agent  - use hycodan as first ling agent for cough   Anemia, unspecified type - mild new anemia 12/19/17 blood work in ER  - talk to PCP  Poor appetite & Encounter for therapeutic drug monitoring - probably due to ofev but given no weight loss and benefit of ofev in IPF; continue ofev at the lower dose - glad lower dose is better and diarrhea is better  -= check LFT 12/25/2017    Followup 3 months or sooner if needed; ILD clinic Tuesday PM - simple walk at followup and [facility]

## 2017-12-25 NOTE — Progress Notes (Signed)
Subjective:     Patient ID: Shane [name], male   DOB: [birth year], 77 y.o.   MRN: [id number]  HPI    PCP ARONSON,RICHARD A, MD  HPI   IOV 10/12/2016  Chief Complaint  Patient presents with  . Pulomary Consult    Pt referred by Dr. [name] for DOE x months. pt states the SOB has worsened in the last couple weeks. Pt c/o dry cough. pt deneis f/c/s.     77 year old male referred for dyspnea. Reports that he is obese on CPAP on RA for years. 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 He has new onset dyspnea and cough. Details in ACCP ILD questionnaire below. CXR recently personally visualized shows ILD changes. Reports that pneumonia and lasix Rx by PCP have not helped. Review of old CT chest 12/05/2008 shows bibasal ILD without honeuycombing on non HRCT chest. Possibly datin back to 2007 CT abd lung cut. Current findings are worse.   Symptos:  Dyspnea is new x 4-5 months gets dyspneic hurrying on level ground or stops at 100 yerds. Cough x 3 weeks with cough at night and is dry and can be episodic with severe attacks.   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 Did get xposed to insecticide, fertiiler, talc pain, cement, tile    OV  11/23/2016  Chief Complaint  Patient presents with  . Follow-up    Pt here after PFT, HRCT, RHC. Pt state his breathing has slightly improved. Pt c/o increase in dry cough. Pt deneis CP/tightness and f/c/s.    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 He and his wife stated that now he is getting progressively more  short of breath. Although he does tell me that when he tries to work on his car and he tries to push himself he does start feeling better but he feels that he is deconditioned but is unable to condition himself more in order to feel less short of breath. Wife though feels that his dyspnea is more progressive.  In terms of disease etiology:   - He had high resolution CT chest 10/08/2016 and the CT findings are not consistent with UIP. And that is chronicity to this disease with presence even 7 years ago. This mild progression only since then. He had autoimmune profile which is essentially negative.  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 He will only walk 2 laps and stopped because of shortness of breath. But his pulse ox did not drop below 95%. Resting pulse ox was 99%. Peak heart rate was 102. In essence that was clinically insignificant desaturation    In terms of operative risk for surgical lung biopsy   - His right  heart cath shows normal mean pulmonary artery pressure 20 mm with slight elevation pulmonary artery systolic pressure to 34 mmHg and a normal wedge pressure.    - His Myoview is reported normal in November 2017    - He does have sleep apnea and uses CPA   - He does have gait instability issues because of his chronic spine disease but this is stable    OV 01/05/2017  Chief Complaint  Patient presents with  . Follow-up    Pt here after biopsy. Pt states his breathing is unchanged since last seen in 11/2016. Pt c/o dry cough. Pt denies CP/tightness and f/c/s.     llung bx discussion   - here wife wife. 12/26/16 - UIP by [name]. No new diagnosis    OV  02/20/2017  Chief Complaint  Patient presents with  . Follow-up    Pt states his breathing is unchanged since last OV. Pt still has not recieved the Esbriet because of financial issues and unable to locate a foundation to help. Pt c/o dry cough. Pt denies CP/tightness.     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  He is here to follow-up how he was doing on Pirfenidone (Esbriet). However he had delays getting charity support. Helpful foundation ran out of money versus from his income level too high to provide support. He wants to plan of the [facility]. Overall he feels stable in terms of cough and dyspnea. He is interested in pulmonary rehabilitation and he has been in touch with [name] the chair of the local pulmonary fibrosis foundation support group and plans to attend the meeting March 29. There are no other new issues     OV 05/05/2017  Chief Complaint  Patient presents with  . Follow-up    pt states he is at baseline, some sob with exertion.     FU IPF; diagnosed based on biopsy Jan 2018.   Now on esbriet x 2 months. Good tolerance. Only mild fatigue. No GI side efects. Feels better. Seems to think esbriet improves situation; re-educated it does not.  Not using sun screen ; did not remember despite prior education. Wants referral for sleep doc.    OV 06/29/2017  Chief Complaint  Patient presents with  . Follow-up    Pt states his breathing is doing well. Pt states he has an occassional dry cough. Pt denies CP/tightness and f/c/s.     Follow-up idiopathic pulmonary fibrosis  Last visit June 2018 at that time he was on Pirfenidone (Esbriet) for 2 months. After that he had routine liver function tests and started having increased liver enzymes. We at first stopped the statins but this did not improve the liver function test. In fact it got worse. Finally we stopped Pirfenidone (Esbriet) and his liver function tests as of today has normalized.  He did report significant fatigue while on Pirfenidone (Esbriet). Now that he's office. The fatigue is resolved. He is doing pulmonary rehabilitation and this is helping his physical conditioning significantly and he feels good. Overall he feels is IPF is stable. He is here with his wife and they both extremely interested in further antifibiotic therapy particularly Ofev. WE discussed   OV 10/04/2017  Chief Complaint  Patient presents with  . Follow-up    pft done today, no change in breathing, mild coughing/non productive   Follow-up idiopathic pulmonary fibrosis biopsy-proven January 2018. Pirfenidone (Esbriet) failure with abnormal liver function test.Started on Ofev July 2018  He has normal liver function test 09/04/2017. Pulmonary function test today shows continued stability. Walking desaturation test 185 feet 3 laps on room air: Resting heart rate 72/m. Final heart rate 91/m.Resting pulse ox 100%. Final pulse ox 97%. He is here with his wife. They both at test that he is feeling better because of pulmonary rehabilitation. He is also tolerating the Ofev quite well except for mild occasional diarrhea. His main concern is that randomly for the last few monthsbut at night while sitting and postprandial he feels few to several seconds of a catch and shortness of breath but at this time he does not desaturate. Otherwise no problems. He's taking treatment for sleep apnea. He is interested in research protocols.He does feel a catch in his lower back which he thinks is from fibrosis pulling on his chest.    OV 12/25/2017  Chief Complaint  Patient presents with  . Follow-up    Pt states he has been doing okay up until 1 week ago. Pt went to the ER 12/19/17 due to CP and worsening SOB. Also states he is coughing a lot mainly in the afternoons.    Follow-up idiopathic pulmonary fibrosis.  Last visit Halloween 2018.  At that time he was on nintedanib full dose 150 mg twice a day.  After that we took  a call in December 2018 with lot of diarrhea.  Since then he is reduced to 100 mg twice a day for nintedanib.  Most recently December 19, 2017 he ended up in the emergency department with atypical chest pains.  Chart was reviewed.  His troponin and d dimer was normal.  His baseline creatinine which was 1.5 mg percent has actually improved but paradoxically his hemoglobin has dropped although only mildly from 15.2 g% to 12.7 g%.  His d-dimer, BNP and troponin were normal.  He had a chest x-ray general 15 2019 that I personally visualized and shows chronic ILD changes without change.  In terms of his [facility]'s interstitial lung disease questionnaire shows that his symptoms are slowly declining compared to earlier visit.  He and his wife specifically state that he still continues to have these random catch in his shortness of breath and then he will have 2 things caught and then take a breath again.  He says his oxygen was always stable without any desaturation.  In addition for the last few weeks his cough is worse but there is no sputum there is no fever or chills.  He does not want prednisone for this.  Medication review shows he is on ACE inhibitor.  He is interested in research protocol.  For his air hunger and primary care physician is given him Ativan as needed.  But he tells me that for the cough he takes Hycodan and this works well for him particularly at night.    And also pulmonary function test both FVC and DLCO have declined.  Walking desaturation test on 85 feet x3 laps on room air: He started at 100% at rest.  Finished the third lap at 97%.  Heart rate at rest was 64 and final heart rate was 87.  He did not desaturate to clinical significance but he did drop 3 points with the submaximal exertion.   K-BILD ILD QUESTIONNAIRE, Symptom score over prior 2 weeks  7-none, 6-rarely, 5-occ, 5-some times, 3-sev times, 2-most times, 1-every time 10/04/2017  12/25/2017   Dyspnea for stairs, incline or hill 1 1   Chest Tightness 6 3  Worry about  seriousness of lung complaint 6 4  Avoided doing things that make you dyspneic 4 3  Have you felt loss of control of lung condition (reversed from original) 5 5  Felt fed up due to lung condition 6 4  Felt urge to breathe aka air hunger 4 4  Has lung condition made you feel anxious 6 5  How often have you experienced wheezing or whistling sound 4 3  How much of the time have you felt your lung dz is getting worse 6 5  How much has your lung condition interfered with job or daily task 2 2  Were you expecting your lung condition to get worse 6 5  How much has your lung function limited you carrying things like groceris 3 2  How much has your lung function made you think of EOL? 6 6  Total    Are you financially worse off 4 4  Grand Total        Results for PALMER, FAHRNER (MRN [id number]  Ref. Range 10/14/2016 08:19 04/06/2017 10:13 10/04/2017 08:51 12/25/2017   FVC-Pre Latest Units: L 3.00 2.94 2.94 2.88L/61%  FVC-%Pred-Pre Latest Units: % 63 62 62    Results for ABDURRAHMAN, PETERSHEIM (MRN [id number]) a  Ref. Range 10/14/2016 08:19 04/06/2017 10:13 10/04/2017 08:51 12/25/2017   DLCO unc Latest Units:  16.82 16.43 15.04 14.56  DLCO unc % pred Latest Units: % 46 45 41 40%        has a past medical history of Cancer ([place]), Cataract, Chest pain, Chronic kidney disease, Diabetes mellitus, Dyspnea, GERD (gastroesophageal reflux disease), Hyperlipidemia, Hypertension, Neuromuscular disorder ([place]), PONV (postoperative nausea and vomiting), and Sleep apnea.   reports that  has never smoked. he has never used smokeless tobacco.  Past Surgical History:  Procedure Laterality Date  . ANTERIOR FUSION CERVICAL SPINE  2012  . CARDIAC CATHETERIZATION  2011  . CARDIAC CATHETERIZATION N/A 11/09/2016   Procedure: Right Heart Cath;  Surgeon: Belva Crome, MD;  Location: [facility];  Service: Cardiovascular;  Laterality: N/A;  . carpel tunnel     left wrist  .  CATARACT EXTRACTION W/ INTRAOCULAR LENS  IMPLANT, BILATERAL  2013  . CERVICAL LAMINECTOMY  2012  . COLONOSCOPY N/A 01/14/2013   Procedure: COLONOSCOPY;  Surgeon: Irene Shipper, MD;  Location: [facility];  Service: Endoscopy;  Laterality: N/A;  . EYE SURGERY    . [place]   left  . LUMBAR LAMINECTOMY  2003  . LUNG BIOPSY Left 12/26/2016   Procedure: LUNG BIOPSY;  Surgeon: [name], MD;  Location: [facility];  Service: Thoracic;  Laterality: Left;  . POSTERIOR FUSION CERVICAL SPINE  2012  . TRIGGER FINGER RELEASE  2011   4th finger left hand  . VIDEO ASSISTED THORACOSCOPY Left 12/26/2016   Procedure: VIDEO ASSISTED THORACOSCOPY;  Surgeon: [name], MD;  Location: [facility];  Service: Thoracic;  Laterality: Left;  Marland Kitchen VIDEO BRONCHOSCOPY N/A 12/26/2016   Procedure: VIDEO BRONCHOSCOPY;  Surgeon: [name], MD;  Location: [facility];  Service: Thoracic;  Laterality: N/A;    Allergies  Allergen Reactions  . Codeine Hives and Itching  . Other Other (See Comments)    Esbriet causes elevated LFTs. D/C on 06/14/17    Immunization History  Administered Date(s) Administered  . Influenza Split 08/14/2017  . Influenza, High Dose Seasonal PF 08/05/2016  . Pneumococcal-Unspecified 12/05/2014    Family History  Problem Relation Age of Onset  .  Diabetes Mellitus II Mother   . Emphysema Father 48  . Colon cancer Neg Hx   . Esophageal cancer Neg Hx   . Rectal cancer Neg Hx   . Stomach cancer Neg Hx      Current Outpatient Medications:  .  Ascorbic Acid (VITAMIN C) 1000 MG tablet, Take 1,000 mg by mouth daily., Disp: , Rfl:  .  aspirin 81 MG tablet, Take 81 mg by mouth daily.  , Disp: , Rfl:  .  clotrimazole-betamethasone (LOTRISONE) cream, APPLY TO AFFECTED AREA TWICE A DAY, Disp: , Rfl: 0 .  diazepam (VALIUM) 5 MG tablet, Take 5 mg by mouth at bedtime as needed (for sleep). , Disp: , Rfl:  .  furosemide (LASIX) 40 MG tablet, Take 40 mg by mouth daily. , Disp: ,  Rfl:  .  insulin detemir (LEVEMIR) 100 UNIT/ML injection, Inject 22 Units into the skin at bedtime., Disp: , Rfl:  .  insulin lispro (HUMALOG KWIKPEN) 100 UNIT/ML injection, Inject 20 Units into the skin 3 (three) times daily before meals. Sliding scale, Disp: , Rfl:  .  Insulin Pump Disposable (V-GO 40) KIT, 40 Units by Does not apply route daily. humalog 100 units dispensed every 24 hours. Dispose v-go bag after 24 hours of use. , Disp: , Rfl:  .  KLOR-CON M20 20 MEQ tablet, Take 20 mEq by mouth daily. , Disp: , Rfl:  .  lisinopril (PRINIVIL,ZESTRIL) 20 MG tablet, Take 20 mg by mouth daily. , Disp: , Rfl:  .  Multiple Vitamin (MULTIVITAMIN) tablet, Take 1 tablet by mouth daily.  , Disp: , Rfl:  .  nebivolol (BYSTOLIC) 5 MG tablet, Take 5 mg by mouth daily., Disp: , Rfl:  .  Nintedanib (OFEV) 100 MG CAPS, Take 1 capsule (100 mg total) by mouth 2 (two) times daily., Disp: 60 capsule, Rfl: 3 .  pantoprazole (PROTONIX) 40 MG tablet, Take 40 mg by mouth daily.  , Disp: , Rfl:  .  PROAIR HFA 108 (90 Base) MCG/ACT inhaler, Inhale 2 puffs into the lungs every 4 (four) hours as needed for wheezing or shortness of breath. , Disp: , Rfl:  .  promethazine-phenylephrine (PROMETHAZINE VC PLAIN) 6.25-5 MG/5ML SYRP, Take 5 mLs by mouth every 4 (four) hours as needed for congestion., Disp: , Rfl:   Review of Systems     Objective:   Physical Exam  Constitutional: He is oriented to person, place, and time. He appears well-developed and well-nourished. No distress.  HENT:  Head: Normocephalic and atraumatic.  Right Ear: External ear normal.  Left Ear: External ear normal.  Mouth/Throat: Oropharynx is clear and moist. No oropharyngeal exudate.  mallampatti class 4  Eyes: Conjunctivae and EOM are normal. Pupils are equal, round, and reactive to light. Right eye exhibits no discharge. Left eye exhibits no discharge. No scleral icterus.  Neck: Normal range of motion. Neck supple. No JVD present. No tracheal  deviation present. No thyromegaly present.  Cardiovascular: Normal rate, regular rhythm and intact distal pulses. Exam reveals no gallop and no friction rub.  No murmur heard. Pulmonary/Chest: Effort normal. No respiratory distress. He has no wheezes. He has rales. He exhibits no tenderness.  Abdominal: Soft. Bowel sounds are normal. He exhibits no distension and no mass. There is no tenderness. There is no rebound and no guarding.  Musculoskeletal: Normal range of motion. He exhibits no edema or tenderness.  Lymphadenopathy:    He has no cervical adenopathy.  Neurological: He is alert and oriented to  person, place, and time. He has normal reflexes. No cranial nerve deficit. Coordination normal.  Skin: Skin is warm and dry. No rash noted. He is not diaphoretic. No erythema. No pallor.  Psychiatric: He has a normal mood and affect. His behavior is normal. Judgment and thought content normal.  Nursing note and vitals reviewed.    Vitals:   12/25/17 0921  BP: 122/60  Pulse: 67  SpO2: 99%  Weight: 261 lb (118.4 kg)  Height: 6' 1"  (1.854 m)    Estimated body mass index is 34.43 kg/m as calculated from the following:   Height as of this encounter: 6' 1"  (1.854 m).   Weight as of this encounter: 261 lb (118.4 kg).      Assessment:       ICD-10-CM   1. 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[place]) J84.112   2. Cough R05   3. Atypical chest pain R07.89   4. Anemia, unspecified type D64.9   5. Poor appetite R63.0   6. Encounter for therapeutic drug monitoring Z51.81        Plan:     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[place])  - mildy progressive since Nov 2017  - do HRCT next few weeks (supine and prone per protocol) - continue rehab  - continue ofev 116m (lower dose) twice daily - use hycodan as first line for shortness of breath and use ativan very sparingly only as 2nd line - [facility]; will have [facility] contact you next few weeks  Cough Atypical  chest pain  - probably acute bronchitis or progressive IPF related  - probably lisinopril playing a role    - - talk top PCP ABurnard Bunting MD and stop lisinopril; he can increase bystolic or put you on another agent  - use hycodan as first ling agent for cough -Hold off on prednisone given your concern of hyperglycemia but we will use it if absolutely needed.  Anemia, unspecified type - mild new anemia 12/19/17 blood work in ER  - talk to PCP  Poor appetite & Encounter for therapeutic drug monitoring - probably due to ofev but given no weight loss and benefit of ofev in IPF; continue ofev at the lower dose - glad lower dose is better and diarrhea is better  -= check LFT 12/25/2017    Followup[ 3 months or sooner if needed; ILD clinic Tuesday PM    > 50% of this > 40 min visit spent in face to face counseling or/and coordination of care    Dr. MBrand Males M.D., FBingham Memorial HospitalC.P Pulmonary and Critical Care Medicine Staff Physician, CGirardvilleDirector - Interstitial Lung Disease  Program  Pulmonary [place]ee[place]at [place] [id number] Pager: [phone] If no answer or between  15:00h - 7:00h: call [phone] Telephone: [phone]

## 2017-12-26 ENCOUNTER — Encounter (HOSPITAL_COMMUNITY)
Admission: RE | Admit: 2017-12-26 | Discharge: 2017-12-26 | Disposition: A | Discharge status: Home / Self Care | Payer: Medicare Other | Source: Ambulatory Visit | Attending: Internal Medicine | Admitting: Internal Medicine

## 2017-12-28 ENCOUNTER — Encounter (HOSPITAL_COMMUNITY)
Admission: RE | Admit: 2017-12-28 | Discharge: 2017-12-28 | Disposition: A | Discharge status: Home / Self Care | Payer: Self-pay | Source: Ambulatory Visit | Attending: Internal Medicine | Admitting: Internal Medicine

## 2018-01-01 ENCOUNTER — Ambulatory Visit: Payer: Medicare Other | Admitting: Internal Medicine

## 2018-01-02 ENCOUNTER — Encounter (HOSPITAL_COMMUNITY)
Admission: RE | Admit: 2018-01-02 | Discharge: 2018-01-02 | Disposition: A | Discharge status: Home / Self Care | Payer: Self-pay | Source: Ambulatory Visit | Attending: Internal Medicine | Admitting: Internal Medicine

## 2018-01-04 ENCOUNTER — Encounter (HOSPITAL_COMMUNITY): Payer: Self-pay

## 2018-01-05 ENCOUNTER — Other Ambulatory Visit: Payer: Self-pay

## 2018-01-05 ENCOUNTER — Observation Stay (HOSPITAL_COMMUNITY): Payer: Medicare Other

## 2018-01-05 ENCOUNTER — Emergency Department (HOSPITAL_COMMUNITY): Payer: Medicare Other

## 2018-01-05 ENCOUNTER — Encounter (HOSPITAL_COMMUNITY): Payer: Self-pay

## 2018-01-05 ENCOUNTER — Observation Stay (HOSPITAL_COMMUNITY)
Admission: EM | Admit: 2018-01-05 | Discharge: 2018-01-07 | Disposition: A | Discharge status: Home / Self Care | Payer: Medicare Other | Attending: Internal Medicine | Admitting: Internal Medicine

## 2018-01-05 DIAGNOSIS — K219 Gastro-esophageal reflux disease without esophagitis: Secondary | ICD-10-CM | POA: Diagnosis not present

## 2018-01-05 DIAGNOSIS — E162 Hypoglycemia, unspecified: Secondary | ICD-10-CM

## 2018-01-05 DIAGNOSIS — E1122 Type 2 diabetes mellitus with diabetic chronic kidney disease: Secondary | ICD-10-CM | POA: Insufficient documentation

## 2018-01-05 DIAGNOSIS — R55 Syncope and collapse: Secondary | ICD-10-CM | POA: Diagnosis not present

## 2018-01-05 DIAGNOSIS — E11649 Type 2 diabetes mellitus with hypoglycemia without coma: Secondary | ICD-10-CM | POA: Diagnosis not present

## 2018-01-05 DIAGNOSIS — G4733 Obstructive sleep apnea (adult) (pediatric): Secondary | ICD-10-CM | POA: Diagnosis present

## 2018-01-05 DIAGNOSIS — J84112 Idiopathic pulmonary fibrosis: Secondary | ICD-10-CM | POA: Diagnosis present

## 2018-01-05 DIAGNOSIS — R109 Unspecified abdominal pain: Secondary | ICD-10-CM

## 2018-01-05 DIAGNOSIS — I441 Atrioventricular block, second degree: Secondary | ICD-10-CM | POA: Diagnosis not present

## 2018-01-05 DIAGNOSIS — T383X5A Adverse effect of insulin and oral hypoglycemic [antidiabetic] drugs, initial encounter: Secondary | ICD-10-CM | POA: Diagnosis present

## 2018-01-05 DIAGNOSIS — Z9989 Dependence on other enabling machines and devices: Secondary | ICD-10-CM | POA: Diagnosis not present

## 2018-01-05 DIAGNOSIS — R531 Weakness: Secondary | ICD-10-CM | POA: Insufficient documentation

## 2018-01-05 DIAGNOSIS — I700 Atherosclerosis of aorta: Secondary | ICD-10-CM | POA: Diagnosis not present

## 2018-01-05 DIAGNOSIS — R1030 Lower abdominal pain, unspecified: Secondary | ICD-10-CM | POA: Insufficient documentation

## 2018-01-05 DIAGNOSIS — R1032 Left lower quadrant pain: Secondary | ICD-10-CM

## 2018-01-05 DIAGNOSIS — I130 Hypertensive heart and chronic kidney disease with heart failure and stage 1 through stage 4 chronic kidney disease, or unspecified chronic kidney disease: Secondary | ICD-10-CM | POA: Diagnosis not present

## 2018-01-05 DIAGNOSIS — R52 Pain, unspecified: Secondary | ICD-10-CM

## 2018-01-05 DIAGNOSIS — Z794 Long term (current) use of insulin: Secondary | ICD-10-CM | POA: Diagnosis not present

## 2018-01-05 DIAGNOSIS — I10 Essential (primary) hypertension: Secondary | ICD-10-CM | POA: Diagnosis not present

## 2018-01-05 DIAGNOSIS — R42 Dizziness and giddiness: Secondary | ICD-10-CM | POA: Diagnosis not present

## 2018-01-05 DIAGNOSIS — N183 Chronic kidney disease, stage 3 (moderate): Secondary | ICD-10-CM | POA: Diagnosis not present

## 2018-01-05 DIAGNOSIS — Z79899 Other long term (current) drug therapy: Secondary | ICD-10-CM | POA: Diagnosis not present

## 2018-01-05 DIAGNOSIS — E118 Type 2 diabetes mellitus with unspecified complications: Secondary | ICD-10-CM | POA: Diagnosis not present

## 2018-01-05 DIAGNOSIS — E160 Drug-induced hypoglycemia without coma: Secondary | ICD-10-CM | POA: Diagnosis not present

## 2018-01-05 DIAGNOSIS — Z9641 Presence of insulin pump (external) (internal): Secondary | ICD-10-CM

## 2018-01-05 DIAGNOSIS — I5032 Chronic diastolic (congestive) heart failure: Secondary | ICD-10-CM | POA: Insufficient documentation

## 2018-01-05 HISTORY — DX: Idiopathic pulmonary fibrosis: J84.112

## 2018-01-05 LAB — CBG MONITORING, ED
Glucose-Capillary: 142 mg/dL — ABNORMAL HIGH (ref 65–99)
Glucose-Capillary: 173 mg/dL — ABNORMAL HIGH (ref 65–99)
Glucose-Capillary: 56 mg/dL — ABNORMAL LOW (ref 65–99)

## 2018-01-05 LAB — COMPREHENSIVE METABOLIC PANEL
ALT: 29 U/L (ref 17–63)
AST: 33 U/L (ref 15–41)
Albumin: 3.4 g/dL — ABNORMAL LOW (ref 3.5–5.0)
Alkaline Phosphatase: 57 U/L (ref 38–126)
Anion gap: 12 (ref 5–15)
BUN: 24 mg/dL — ABNORMAL HIGH (ref 6–20)
CO2: 23 mmol/L (ref 22–32)
Calcium: 9.3 mg/dL (ref 8.9–10.3)
Chloride: 103 mmol/L (ref 101–111)
Creatinine, Ser: 1.47 mg/dL — ABNORMAL HIGH (ref 0.61–1.24)
GFR calc Af Amer: 52 mL/min — ABNORMAL LOW (ref >60)
GFR calc non Af Amer: 45 mL/min — ABNORMAL LOW (ref >60)
Glucose, Bld: 90 mg/dL (ref 65–99)
Potassium: 4.6 mmol/L (ref 3.5–5.1)
Sodium: 138 mmol/L (ref 135–145)
Total Bilirubin: 0.9 mg/dL (ref 0.3–1.2)
Total Protein: 7.1 g/dL (ref 6.5–8.1)

## 2018-01-05 LAB — CBC WITH DIFFERENTIAL/PLATELET
Basophils Absolute: 0 10*3/uL (ref 0.0–0.1)
Basophils Relative: 0 %
Eosinophils Absolute: 0.1 10*3/uL (ref 0.0–0.7)
Eosinophils Relative: 1 %
HCT: 43 % (ref 39.0–52.0)
Hemoglobin: 14.7 g/dL (ref 13.0–17.0)
Lymphocytes Relative: 10 %
Lymphs Abs: 1.3 10*3/uL (ref 0.7–4.0)
MCH: 32.8 pg (ref 26.0–34.0)
MCHC: 34.2 g/dL (ref 30.0–36.0)
MCV: 96 fL (ref 78.0–100.0)
Monocytes Absolute: 1.7 10*3/uL — ABNORMAL HIGH (ref 0.1–1.0)
Monocytes Relative: 13 %
Neutro Abs: 9.7 10*3/uL — ABNORMAL HIGH (ref 1.7–7.7)
Neutrophils Relative %: 76 %
Platelets: 227 10*3/uL (ref 150–400)
RBC: 4.48 MIL/uL (ref 4.22–5.81)
RDW: 13 % (ref 11.5–15.5)
WBC: 12.8 10*3/uL — ABNORMAL HIGH (ref 4.0–10.5)

## 2018-01-05 LAB — I-STAT TROPONIN, ED: Troponin i, poc: 0 ng/mL (ref 0.00–0.08)

## 2018-01-05 LAB — LIPASE, BLOOD: Lipase: 31 U/L (ref 11–51)

## 2018-01-05 LAB — I-STAT CG4 LACTIC ACID, ED: Lactic Acid, Venous: 1.76 mmol/L (ref 0.5–1.9)

## 2018-01-05 MED ORDER — NINTEDANIB ESYLATE 100 MG PO CAPS: 100.0000 mg | ORAL_CAPSULE | Freq: Two times a day (BID) | ORAL | Status: DC

## 2018-01-05 MED ORDER — PANTOPRAZOLE SODIUM 40 MG PO TBEC
40.0000 mg | DELAYED_RELEASE_TABLET | Freq: Every day | ORAL | Status: DC
  Administered 2018-01-06 – 2018-01-07 (×2): 40 mg via ORAL
  Filled 2018-01-05 (×2): qty 1

## 2018-01-05 MED ORDER — DEXTROSE 50 % IV SOLN
1.0000 | Freq: Once | INTRAVENOUS | Status: AC
  Administered 2018-01-05: 50 mL via INTRAVENOUS

## 2018-01-05 MED ORDER — IOPAMIDOL (ISOVUE-300) INJECTION 61%
INTRAVENOUS | Status: AC
  Filled 2018-01-05: qty 100

## 2018-01-05 MED ORDER — IOPAMIDOL (ISOVUE-370) INJECTION 76%
INTRAVENOUS | Status: AC
  Administered 2018-01-05: 100 mL
  Filled 2018-01-05: qty 100

## 2018-01-05 MED ORDER — NEBIVOLOL HCL 5 MG PO TABS
5.0000 mg | ORAL_TABLET | Freq: Every day | ORAL | Status: DC
  Administered 2018-01-06 – 2018-01-07 (×2): 5 mg via ORAL
  Filled 2018-01-05 (×3): qty 1

## 2018-01-05 MED ORDER — ONDANSETRON HCL 4 MG/2ML IJ SOLN
4.0000 mg | Freq: Once | INTRAMUSCULAR | Status: AC
  Administered 2018-01-05: 4 mg via INTRAVENOUS
  Filled 2018-01-05: qty 2

## 2018-01-05 MED ORDER — ACETAMINOPHEN 650 MG RE SUPP: 650.0000 mg | Freq: Four times a day (QID) | RECTAL | Status: DC | PRN

## 2018-01-05 MED ORDER — INSULIN ASPART 100 UNIT/ML ~~LOC~~ SOLN: 0.0000 [IU] | SUBCUTANEOUS | Status: DC

## 2018-01-05 MED ORDER — IPRATROPIUM-ALBUTEROL 0.5-2.5 (3) MG/3ML IN SOLN: 3.0000 mL | Freq: Four times a day (QID) | RESPIRATORY_TRACT | Status: DC | PRN

## 2018-01-05 MED ORDER — POTASSIUM CHLORIDE CRYS ER 20 MEQ PO TBCR
20.0000 meq | EXTENDED_RELEASE_TABLET | Freq: Every day | ORAL | Status: DC
  Administered 2018-01-06 – 2018-01-07 (×2): 20 meq via ORAL
  Filled 2018-01-05 (×2): qty 1

## 2018-01-05 MED ORDER — ONDANSETRON HCL 4 MG/2ML IJ SOLN: 4.0000 mg | Freq: Four times a day (QID) | INTRAMUSCULAR | Status: DC | PRN

## 2018-01-05 MED ORDER — LISINOPRIL 20 MG PO TABS
20.0000 mg | ORAL_TABLET | Freq: Every day | ORAL | Status: DC
  Administered 2018-01-06 – 2018-01-07 (×2): 20 mg via ORAL
  Filled 2018-01-05 (×2): qty 1

## 2018-01-05 MED ORDER — FUROSEMIDE 40 MG PO TABS: 40.0000 mg | ORAL_TABLET | Freq: Every day | ORAL | Status: DC

## 2018-01-05 MED ORDER — ACETAMINOPHEN 325 MG PO TABS: 650.0000 mg | ORAL_TABLET | Freq: Four times a day (QID) | ORAL | Status: DC | PRN

## 2018-01-05 MED ORDER — SODIUM CHLORIDE 0.9 % IV BOLUS (SEPSIS)
500.0000 mL | Freq: Once | INTRAVENOUS | Status: AC
  Administered 2018-01-05: 500 mL via INTRAVENOUS

## 2018-01-05 MED ORDER — ONDANSETRON HCL 4 MG PO TABS: 4.0000 mg | ORAL_TABLET | Freq: Four times a day (QID) | ORAL | Status: DC | PRN

## 2018-01-05 MED ORDER — ASPIRIN EC 81 MG PO TBEC
81.0000 mg | DELAYED_RELEASE_TABLET | Freq: Every day | ORAL | Status: DC
  Administered 2018-01-06 – 2018-01-07 (×2): 81 mg via ORAL
  Filled 2018-01-05 (×2): qty 1

## 2018-01-05 MED ORDER — ENOXAPARIN SODIUM 40 MG/0.4ML ~~LOC~~ SOLN
40.0000 mg | Freq: Every day | SUBCUTANEOUS | Status: DC
  Administered 2018-01-06 – 2018-01-07 (×2): 40 mg via SUBCUTANEOUS
  Filled 2018-01-05 (×2): qty 0.4

## 2018-01-05 MED ORDER — IPRATROPIUM-ALBUTEROL 0.5-2.5 (3) MG/3ML IN SOLN: 3.0000 mL | RESPIRATORY_TRACT | Status: DC | PRN

## 2018-01-05 MED ORDER — DIAZEPAM 5 MG PO TABS
5.0000 mg | ORAL_TABLET | Freq: Every evening | ORAL | Status: DC | PRN
  Administered 2018-01-06: 5 mg via ORAL
  Filled 2018-01-05: qty 1

## 2018-01-05 MED ORDER — DEXTROSE 50 % IV SOLN
INTRAVENOUS | Status: AC
  Filled 2018-01-05: qty 50

## 2018-01-05 MED ORDER — LORAZEPAM 2 MG/ML IJ SOLN: 1.0000 mg | INTRAMUSCULAR | Status: DC | PRN

## 2018-01-05 NOTE — ED Triage Notes (Signed)
Pt brought in by [facility] from home for syncopal episode. Pt states he was at home, started to feel weak and thought his blood sugar was low, pt states the last thing he remembers is drinking some orange juice. Pt is unsure if he hit his head when he fell. Pt was incontinent during LOC. Pt c/o generalized weakness, denies CP/SOB. Abdomen tender on palpation. Pt is A+Ox4. Per EMS pt EKG showed RBBB, EKG from 2017 noted to have similar rhythm. EDP made aware.

## 2018-01-05 NOTE — ED Notes (Signed)
CBG 56 on arrival to ED, MD made aware, pt given 1 amp D50. Pt A+Ox4.

## 2018-01-05 NOTE — ED Notes (Signed)
Called lab regarding CMP and lipase. Per lab, would call back when they find out why they have not resulted.

## 2018-01-05 NOTE — H&P (Addendum)
History and Physical    Shane [name]:[id number] DOB: [birth year] DOA: 01/05/2018  Referring MD/NP/PA: Dr. Elnora Morrison PCP: Burnard Bunting, MD  Patient coming from: Home via EMS  Chief Complaint: Syncopal episode  I have personally briefly reviewed patient's old medical records in [place]   HPI: Shane [name] is a 77 y.o. 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  Patient reports that around 2 PM he felt dizzy/lightheaded and went and checked his blood sugar as he felt it may be low.  Blood sugar was initially noted to be 110 for which the patient states that is somewhat low for him.  He went to go get a glass of orange juice from the refrigerator and next thing he remembers is waking up on the floor.  Unsure if he hit his head or along he was out.  He was unable to get up initially from the floor.  After some time patient was able to get himself off the floor, but states that he walked a few steps and fell again.  During this time patient was reported to have associated symptoms of incontinence of diarrhea that he relates to his medication for IPF while awake, decreased appetite that he relates to his IPF medicine, lower left abdominal pain, cough (chronic), and episode of nausea and vomiting.  Denies any history of seizure, tongue biting, dysuria, fever, chills, palpitations, or chest pain.  Within the last week or so patient has noted intermittent episodes of low blood sugar.  Patient is on insulin pump that gives him 40 units for 24 hours, Humalog 3 times daily of 10-15 units, and Levemir 22 units at night.  He reports being evaluated for chest pain 2 weeks ago, but no cardiac cause found at that time.  With his IPF medicine he also complains of intermittent bouts of constipation and diarrhea  and he started to have a bout of diarrhea this morning.  ED Course: Upon admission into the emergency department patient was seen to be afebrile, pulse 46-69, respiration 15-23, blood pressure 111/53 - 130/70, O2 saturations 94-100% on room air.  Labs revealed WBC 12.8, BUN 24, creatinine 1.47, glucose 90, and lactic acid 1.76. CT scan of the head showed no acute abnormalities and subsequently a CT angiogram abd/pelvis showed no acute findings for patient's abdominal pain. While in the ED patient was noted to have a drop in glucose to 56 requiring and amp of dextrose to be given.    Review of Systems  Constitutional: Positive for malaise/fatigue. Negative for chills, fever and weight loss.  HENT: Negative for ear discharge and nosebleeds.   Eyes: Negative for double vision and photophobia.  Respiratory: Positive for cough (Chronic).   Cardiovascular: Negative for chest pain, palpitations and leg swelling.  Gastrointestinal: Positive for abdominal pain, constipation, diarrhea, nausea and vomiting.  Genitourinary: Negative for dysuria and frequency.  Musculoskeletal: Positive for falls. Negative for joint pain.  Skin: Negative for itching and rash.  Neurological: Positive for loss of consciousness and weakness. Negative for speech change.  Psychiatric/Behavioral: Negative for substance abuse and suicidal ideas.    Past Medical History:  Diagnosis Date  . Cancer ([place])    ling  . Cataract   . Chest pain   . Chronic kidney disease    d/t DM  .  Diabetes mellitus    Vgo disposal insulin bolus  simular to insulin pump  . Dyspnea   . GERD (gastroesophageal reflux disease)   . Hyperlipidemia   . Hypertension   . Idiopathic pulmonary fibrosis ([place]) 11/2016  . Neuromuscular disorder ([place])   . PONV (postoperative nausea and vomiting)   . Sleep apnea     uses cpap asked to bring mask and tubing    Past Surgical History:  Procedure Laterality Date  . ANTERIOR FUSION CERVICAL SPINE  2012    . CARDIAC CATHETERIZATION  2011  . CARDIAC CATHETERIZATION N/A 11/09/2016   Procedure: Right Heart Cath;  Surgeon: Belva Crome, MD;  Location: [facility];  Service: Cardiovascular;  Laterality: N/A;  . carpel tunnel     left wrist  . CATARACT EXTRACTION W/ INTRAOCULAR LENS  IMPLANT, BILATERAL  2013  . CERVICAL LAMINECTOMY  2012  . COLONOSCOPY N/A 01/14/2013   Procedure: COLONOSCOPY;  Surgeon: Irene Shipper, MD;  Location: [facility];  Service: Endoscopy;  Laterality: N/A;  . EYE SURGERY    . [place]   left  . LUMBAR LAMINECTOMY  2003  . LUNG BIOPSY Left 12/26/2016   Procedure: LUNG BIOPSY;  Surgeon: [name], MD;  Location: [facility];  Service: Thoracic;  Laterality: Left;  . POSTERIOR FUSION CERVICAL SPINE  2012  . TRIGGER FINGER RELEASE  2011   4th finger left hand  . VIDEO ASSISTED THORACOSCOPY Left 12/26/2016   Procedure: VIDEO ASSISTED THORACOSCOPY;  Surgeon: [name], MD;  Location: [facility];  Service: Thoracic;  Laterality: Left;  [place] VIDEO BRONCHOSCOPY N/A 12/26/2016   Procedure: VIDEO BRONCHOSCOPY;  Surgeon: [name], MD;  Location: [facility];  Service: Thoracic;  Laterality: N/A;     reports that  has never smoked. he has never used smokeless tobacco. He reports that he does not drink alcohol or use drugs.  Allergies  Allergen Reactions  . Codeine Hives and Itching  . Other Other (See Comments)    Esbriet causes elevated LFTs. D/C on 06/14/17    Family History  Problem Relation Age of Onset  . Diabetes Mellitus II Mother   . Emphysema Father 72  . Colon cancer Neg Hx   . Esophageal cancer Neg Hx   . Rectal cancer Neg Hx   . Stomach cancer Neg Hx     Prior to Admission medications   Medication Sig Start Date End Date Taking? Authorizing Provider  Ascorbic Acid (VITAMIN C) 1000 MG tablet Take 1,000 mg by mouth daily.   Yes [provider]  aspirin 81 MG tablet Take 81 mg by mouth daily.     Yes [provider]  clotrimazole-betamethasone (LOTRISONE) cream APPLY TO AFFECTED AREA TWICE A DAY AS NEEDED FOR IRRITATION 01/16/17  Yes [provider]  diazepam (VALIUM) 5 MG tablet Take 5 mg by mouth at bedtime as needed (for sleep).    Yes [provider]  furosemide (LASIX) 40 MG tablet Take 40 mg by mouth daily.  09/26/16  Yes [provider]  insulin detemir (LEVEMIR) 100 UNIT/ML injection Inject 22 Units into the skin at bedtime.   Yes [provider]  Insulin Pump Disposable (V-GO 40) KIT 40 Units by Does not apply route daily. humalog 100 units dispensed every 24 hours. Dispose v-go bag after 24 hours of use.    Yes [provider]  KLOR-CON M20 20 MEQ tablet Take 20 mEq by mouth  daily.  09/26/16  Yes [provider]  lisinopril (PRINIVIL,ZESTRIL) 20 MG tablet Take 20 mg by mouth daily.  09/08/16  Yes [provider]  Multiple Vitamin (MULTIVITAMIN) tablet Take 1 tablet by mouth daily.     Yes [provider]  nebivolol (BYSTOLIC) 5 MG tablet Take 5 mg by mouth daily.   Yes [provider]  Nintedanib (OFEV) 100 MG CAPS Take 1 capsule (100 mg total) by mouth 2 (two) times daily. 11/09/17  Yes Brand Males, MD  pantoprazole (PROTONIX) 40 MG tablet Take 40 mg by mouth daily.     Yes [provider]  PROAIR HFA 108 (90 Base) MCG/ACT inhaler Inhale 2 puffs into the lungs every 4 (four) hours as needed for wheezing or shortness of breath.  10/03/16  Yes [provider]  promethazine-phenylephrine (PROMETHAZINE VC PLAIN) 6.25-5 MG/5ML SYRP Take 5 mLs by mouth every 4 (four) hours as needed for congestion.   Yes [provider]    Physical Exam:  Constitutional: NAD, calm, comfortable Vitals:   01/05/18 1900 01/05/18 1930 01/05/18 2015 01/05/18 2100  BP: 118/60 130/70  (!) 121/54  Pulse: (!) 52 (!) 55 69 68  Resp: 15 17 (!) 23 18  Temp:      TempSrc:      SpO2: 99% 99% 96% 96%    Weight:      Height:       Eyes: PERRL, lids and conjunctivae normal ENMT: Mucous membranes are moist. Posterior pharynx clear of any exudate or lesions.Normal dentition.  Neck: normal, supple, no masses, no thyromegaly Respiratory: clear to auscultation bilaterally, no wheezing, no crackles. Normal respiratory effort. No accessory muscle use.  Cardiovascular: Regular rate and rhythm, no murmurs / rubs / gallops. No extremity edema. 2+ pedal pulses. No carotid bruits.  Abdomen: no tenderness, no masses palpated. No hepatosplenomegaly. Bowel sounds positive.  Musculoskeletal: no clubbing / cyanosis. No joint deformity upper and lower extremities. Good ROM, no contractures. Normal muscle tone.  Skin: no rashes, lesions, ulcers. No induration Neurologic: CN 2-12 grossly intact. Sensation intact, DTR normal. Strength 5/5 in all 4.  Psychiatric: Normal judgment and insight. Alert and oriented x 3. Normal mood.     Labs on Admission: I have personally reviewed following labs and imaging studies  CBC: Recent Labs  Lab 01/05/18 1805  WBC 12.8*  NEUTROABS 9.7*  HGB 14.7  HCT 43.0  MCV 96.0  PLT 706   Basic Metabolic Panel: Recent Labs  Lab 01/05/18 1805  NA 138  K 4.6  CL 103  CO2 23  GLUCOSE 90  BUN 24*  CREATININE 1.47*  CALCIUM 9.3   GFR: Estimated Creatinine Clearance: 57.6 mL/min (A) (by C-G formula based on SCr of 1.47 mg/dL (H)). Liver Function Tests: Recent Labs  Lab 01/05/18 1805  AST 33  ALT 29  ALKPHOS 57  BILITOT 0.9  PROT 7.1  ALBUMIN 3.4*   Recent Labs  Lab 01/05/18 1805  LIPASE 31   No results for input(s): AMMONIA in the last 168 hours. Coagulation Profile: No results for input(s): INR, PROTIME in the last 168 hours. Cardiac Enzymes: No results for input(s): CKTOTAL, CKMB, CKMBINDEX, TROPONINI in the last 168 hours. BNP (last 3 results) No results for input(s): PROBNP in the last 8760 hours. HbA1C: No results for input(s): HGBA1C in the  last 72 hours. CBG: Recent Labs  Lab 01/05/18 1703 01/05/18 1821  GLUCAP 56* 142*   Lipid Profile: No results for input(s): CHOL, HDL,  LDLCALC, TRIG, CHOLHDL, LDLDIRECT in the last 72 hours. Thyroid Function Tests: No results for input(s): TSH, T4TOTAL, FREET4, T3FREE, THYROIDAB in the last 72 hours. Anemia Panel: No results for input(s): VITAMINB12, FOLATE, FERRITIN, TIBC, IRON, RETICCTPCT in the last 72 hours. Urine analysis:    Component Value Date/Time   COLORURINE YELLOW 12/19/2016 [place] 12/19/2016 1326   LABSPEC 1.018 12/19/2016 1326   PHURINE 5.0 12/19/2016 1326   GLUCOSEU >=500 (A) 12/19/2016 1326   HGBUR NEGATIVE 12/19/2016 1326   BILIRUBINUR NEGATIVE 12/19/2016 1326   KETONESUR NEGATIVE 12/19/2016 1326   PROTEINUR 30 (A) 12/19/2016 1326   UROBILINOGEN 1.0 09/27/2011 1112   NITRITE NEGATIVE 12/19/2016 1326   LEUKOCYTESUR NEGATIVE 12/19/2016 1326   Sepsis Labs: No results found for this or any previous visit (from the past 240 hour(s)).   Radiological Exams on Admission: Ct Head Wo Contrast  Result Date: 01/05/2018 CLINICAL DATA:  Lost consciousness at home today. EXAM: CT HEAD WITHOUT CONTRAST TECHNIQUE: Contiguous axial images were obtained from the base of the skull through the vertex without intravenous contrast. COMPARISON:  09/27/2011. FINDINGS: Brain: No evidence of acute infarction, hemorrhage, hydrocephalus, extra-axial collection or mass lesion/mass effect. Normal for age cerebral volume. No visible white matter disease. Vascular: Calcification of the cavernous internal carotid arteries consistent with cerebrovascular atherosclerotic disease. No signs of intracranial large vessel occlusion. Skull: Normal. Negative for fracture or focal lesion. Sinuses/Orbits: No acute finding. Other: None. Compared with priors, slight progression of atrophy. IMPRESSION: Normal for age cerebral volume.  No acute intracranial findings. No posttraumatic sequelae  are evident. Electronically Signed   By: Staci Righter M.D.   On: 01/05/2018 20:17   Ct Angio Abd/pel W And/or Wo Contrast  Result Date: 01/05/2018 CLINICAL DATA:  Acute abdominal pain. EXAM: CTA ABDOMEN AND PELVIS wITHOUT AND WITH CONTRAST TECHNIQUE: Multidetector CT imaging of the abdomen and pelvis was performed using the standard protocol during bolus administration of intravenous contrast. Multiplanar reconstructed images and MIPs were obtained and reviewed to evaluate the vascular anatomy. CONTRAST:  173m ISOVUE-370 IOPAMIDOL (ISOVUE-370) INJECTION 76% COMPARISON:  02/13/2006 FINDINGS: VASCULAR Aorta: Normal caliber. Mild infrarenal aortic atherosclerosis. No dissection. Celiac: Widely patent SMA: Widely patent Renals: Single bilaterally, widely patent IMA: Widely patent Inflow: Widely patent.  No aneurysm or dissection. Proximal Outflow: Widely patent. Veins: Grossly unremarkable. Review of the MIP images confirms the above findings. NON-VASCULAR Lower chest: Scarring in the lung bases. Heart is mildly enlarged. No effusions. Hepatobiliary: No focal hepatic abnormality. Gallbladder unremarkable. Pancreas: No focal abnormality or ductal dilatation. Spleen: No focal abnormality.  Normal size. Adrenals/Urinary Tract: No adrenal abnormality. No focal renal abnormality. No stones or hydronephrosis. Urinary bladder is unremarkable. Stomach/Bowel: Appendix is normal. Stomach, large and small bowel grossly unremarkable. Lymphatic: No adenopathy. Reproductive: Mild central prostate calcifications. Other: No free fluid or free air. Musculoskeletal: Degenerative disc disease throughout the lumbar spine. Postoperative changes in the lower lumbar spine. IMPRESSION: VASCULAR No evidence of aortic aneurysm or dissection. Mild atherosclerotic calcifications in the infrarenal aorta. NON-VASCULAR No acute findings in the abdomen or pelvis. Electronically Signed   By: KRolm BaptiseM.D.   On: 01/05/2018 20:30    EKG:  Independently reviewed.  Normal sinus rhythm prolonged PR interval RBBB and LABF Assessment/Plan Syncope and collapse: Acute.  Patient reports feeling lightheaded and question of possibility of his blood sugars being low but were noted to be 110 prior to loss of consciousness.  Patient denies tripping and falling.  Question arrhythmia  versus orthostatic hypotension versus hypoglycemia as cause of loss of consciousness. - Admit to telemetry bed - Monitor for seizure-like activity - Check orthostatics - Follow-up telemetry overnight - check Echocardiogram  Hypoglycemia due to insulin, insulin-dependent diabetes mellitus: acute.  Patient reports decreased appetite with medication for IPF and recent hypoglycemia.  Blood sugars noted to drop as low as 52 in the ED.  Patient on insulin pump along with Humalog injections 3 times daily and Levemir. - Hypoglycemic protocol - Hold home insulin regimen until hypoglycemia resolves. - CBGs every 4 hours with customed SSI  - Will need to patient's home diabetic insulin regimen in a.m.  Weakness: Patient reports generalized weakness.  Possibility of dehydration. - Physical therapy to ambulate - May warrant further investigation if symptoms persist  Lower abdominal pain:no clear cause for patient's symptoms noted on CT of the abdomen and pelvis. - Follow-up urinalysis  Chronic kidney disease stage III: Patient presents with a creatinine of 1.47 and BUN 24.  Baseline creatinine 1.3-1.7. - Continue to monitor  Idiopathic pulmonary fibrosis - Continue Nintedanib  Essential hypertension - Continue lisinopril and Bystolic - Held Lasix  GERD - Continue Protonix  OSA on CPAP - Continue CPAP at night   DVT prophylaxis: lovenox   Code Status: full  Family Communication: Discussed plan of care with the patient and family present at bedside Disposition Plan: TBD Consults called: none Admission status:inpatient   Norval Morton MD [facility] Pager [phone]   If 7PM-7AM, please contact night-coverage [email or web address] Password [facility]  01/05/2018, 9:42 PM
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
## 2018-01-05 NOTE — ED Notes (Signed)
Patient transported to CT 

## 2018-01-06 ENCOUNTER — Observation Stay (HOSPITAL_COMMUNITY): Payer: Medicare Other

## 2018-01-06 ENCOUNTER — Other Ambulatory Visit: Payer: Self-pay

## 2018-01-06 DIAGNOSIS — E11649 Type 2 diabetes mellitus with hypoglycemia without coma: Secondary | ICD-10-CM | POA: Diagnosis not present

## 2018-01-06 DIAGNOSIS — I441 Atrioventricular block, second degree: Secondary | ICD-10-CM | POA: Diagnosis not present

## 2018-01-06 DIAGNOSIS — E118 Type 2 diabetes mellitus with unspecified complications: Secondary | ICD-10-CM

## 2018-01-06 DIAGNOSIS — R55 Syncope and collapse: Secondary | ICD-10-CM | POA: Diagnosis not present

## 2018-01-06 DIAGNOSIS — T383X5A Adverse effect of insulin and oral hypoglycemic [antidiabetic] drugs, initial encounter: Secondary | ICD-10-CM | POA: Diagnosis present

## 2018-01-06 DIAGNOSIS — I10 Essential (primary) hypertension: Secondary | ICD-10-CM | POA: Diagnosis present

## 2018-01-06 DIAGNOSIS — R531 Weakness: Secondary | ICD-10-CM | POA: Diagnosis not present

## 2018-01-06 DIAGNOSIS — Z9641 Presence of insulin pump (external) (internal): Secondary | ICD-10-CM

## 2018-01-06 DIAGNOSIS — E160 Drug-induced hypoglycemia without coma: Secondary | ICD-10-CM | POA: Diagnosis present

## 2018-01-06 LAB — URINALYSIS, ROUTINE W REFLEX MICROSCOPIC
Bilirubin Urine: NEGATIVE
Glucose, UA: NEGATIVE mg/dL
Hgb urine dipstick: NEGATIVE
Ketones, ur: NEGATIVE mg/dL
Leukocytes, UA: NEGATIVE
Nitrite: NEGATIVE
Protein, ur: NEGATIVE mg/dL
Specific Gravity, Urine: >1.046 — ABNORMAL HIGH (ref 1.005–1.030)
pH: 5 (ref 5.0–8.0)

## 2018-01-06 LAB — CBC
HCT: 39.9 % (ref 39.0–52.0)
Hemoglobin: 13.4 g/dL (ref 13.0–17.0)
MCH: 32.5 pg (ref 26.0–34.0)
MCHC: 33.6 g/dL (ref 30.0–36.0)
MCV: 96.8 fL (ref 78.0–100.0)
Platelets: 215 10*3/uL (ref 150–400)
RBC: 4.12 MIL/uL — ABNORMAL LOW (ref 4.22–5.81)
RDW: 13.2 % (ref 11.5–15.5)
WBC: 8.9 10*3/uL (ref 4.0–10.5)

## 2018-01-06 LAB — BASIC METABOLIC PANEL
Anion gap: 11 (ref 5–15)
BUN: 23 mg/dL — ABNORMAL HIGH (ref 6–20)
CO2: 22 mmol/L (ref 22–32)
Calcium: 8.9 mg/dL (ref 8.9–10.3)
Chloride: 102 mmol/L (ref 101–111)
Creatinine, Ser: 1.48 mg/dL — ABNORMAL HIGH (ref 0.61–1.24)
GFR calc Af Amer: 51 mL/min — ABNORMAL LOW (ref >60)
GFR calc non Af Amer: 44 mL/min — ABNORMAL LOW (ref >60)
Glucose, Bld: 142 mg/dL — ABNORMAL HIGH (ref 65–99)
Potassium: 4.7 mmol/L (ref 3.5–5.1)
Sodium: 135 mmol/L (ref 135–145)

## 2018-01-06 LAB — GLUCOSE, CAPILLARY
Glucose-Capillary: 165 mg/dL — ABNORMAL HIGH (ref 65–99)
Glucose-Capillary: 167 mg/dL — ABNORMAL HIGH (ref 65–99)
Glucose-Capillary: 188 mg/dL — ABNORMAL HIGH (ref 65–99)
Glucose-Capillary: 195 mg/dL — ABNORMAL HIGH (ref 65–99)

## 2018-01-06 LAB — TROPONIN I: Troponin I: <0.03 ng/mL (ref <0.03)

## 2018-01-06 LAB — CBG MONITORING, ED: Glucose-Capillary: 122 mg/dL — ABNORMAL HIGH (ref 65–99)

## 2018-01-06 MED ORDER — INSULIN ASPART 100 UNIT/ML ~~LOC~~ SOLN: 0.0000 [IU] | Freq: Three times a day (TID) | SUBCUTANEOUS | Status: DC | PRN

## 2018-01-06 MED ORDER — SODIUM CHLORIDE 0.9 % IV SOLN
INTRAVENOUS | Status: DC
  Administered 2018-01-06 – 2018-01-07 (×2): via INTRAVENOUS

## 2018-01-06 MED ORDER — LORAZEPAM 2 MG/ML IJ SOLN: 1.0000 mg | Freq: Four times a day (QID) | INTRAMUSCULAR | Status: DC | PRN

## 2018-01-06 NOTE — Progress Notes (Signed)
[facility] PROGRESS NOTE  LEONCE BALE UXL:[id number] DOB: 03-31-1941 DOA: 01/05/2018 PCP: Burnard Bunting, MD  Assessment/Plan:  Syncope and collapse: Acute.  Patient reports feeling lightheaded and question of possibility of his blood sugars being low but were noted to be 110 prior to loss of consciousness. Question arrhythmia versus orthostatic hypotension versus hypoglycemia as cause of loss of consciousness. - Admit to telemetry bed - Monitor for seizure-like activity - Check orthostatics - Follow-up telemetry overnight - pending check Echocardiogram  Hypoglycemia due to insulin, insulin-dependent diabetes mellitus: acute.  Patient reports decreased appetite with medication for IPF and recent hypoglycemia.  Blood sugars noted to drop as low as 52 in the ED.  Patient on insulin pump along with Humalog injections 3 times daily and Levemir. - Hypoglycemic protocol - SSI AC - CBGs ACHS - pt may resume pump tomorrow  Weakness: Patient reports generalized weakness.  Possibility of dehydration. - Physical therapy to ambulate - May warrant further investigation if symptoms persist  Lower abdominal pain:no clear cause for patient's symptoms noted on CT of the abdomen and pelvis. - Follow-up urinalysis-->neg  Chronic kidney disease stage III: Patient presents with a creatinine of 1.48 and BUN 24.  Baseline creatinine 1.3-1.7. - Continue to monitor  Idiopathic pulmonary fibrosis - Continue Nintedanib  Essential hypertension - Continue lisinopril and Bystolic - Held Lasix, resume tomorrow  GERD - Continue Protonix  OSA on CPAP - Continue CPAP at night   DVT prophylaxis: lovenox   Code Status: full  Family Communication: Discussed plan of care with the patient and family present at bedside Disposition Plan: TBD Consults called: none Admission status:inpatient        HPI/Subjective: Patient doing well except for ongoing diarrhea.  Denies any nausea  vomiting.  No fevers.  Feels well otherwise.  Objective: Vitals:   01/06/18 0442 01/06/18 0640  BP: (!) 141/71 (!) 118/55  Pulse: 70   Resp: 18   Temp: 98 F (36.7 C)   SpO2: 100%     Intake/Output Summary (Last 24 hours) at 01/06/2018 1401 Last data filed at 01/06/2018 1141 Gross per 24 hour  Intake 1220 ml  Output 575 ml  Net 645 ml   Filed Weights   01/05/18 1714 01/06/18 0442  Weight: 118.4 kg (261 lb) 120.6 kg (265 lb 14 oz)    Exam:   General:  NCAT  Cardiovascular: RRR, no MRG  Respiratory: CTAB, nl wob  Abdomen: BS+, ND, LLQ pain  Musculoskeletal: moving all extr   Data Reviewed: Basic Metabolic Panel: Recent Labs  Lab 01/05/18 1805 01/06/18 0339  NA 138 135  K 4.6 4.7  CL 103 102  CO2 23 22  GLUCOSE 90 142*  BUN 24* 23*  CREATININE 1.47* 1.48*  CALCIUM 9.3 8.9   Liver Function Tests: Recent Labs  Lab 01/05/18 1805  AST 33  ALT 29  ALKPHOS 57  BILITOT 0.9  PROT 7.1  ALBUMIN 3.4*   Recent Labs  Lab 01/05/18 1805  LIPASE 31   No results for input(s): AMMONIA in the last 168 hours. CBC: Recent Labs  Lab 01/05/18 1805 01/06/18 0339  WBC 12.8* 8.9  NEUTROABS 9.7*  --   HGB 14.7 13.4  HCT 43.0 39.9  MCV 96.0 96.8  PLT 227 215   Cardiac Enzymes: Recent Labs  Lab 01/06/18 1007  TROPONINI <0.03   BNP (last 3 results) Recent Labs    12/19/17 0434  BNP 151.5*    ProBNP (last 3 results) No results  for input(s): PROBNP in the last 8760 hours.  CBG: Recent Labs  Lab 01/05/18 1821 01/05/18 2348 01/06/18 0412 01/06/18 0746 01/06/18 1200  GLUCAP 142* 173* 122* 165* 167*    No results found for this or any previous visit (from the past 240 hour(s)).   Studies: Ct Head Wo Contrast  Result Date: 01/05/2018 CLINICAL DATA:  Lost consciousness at home today. EXAM: CT HEAD WITHOUT CONTRAST TECHNIQUE: Contiguous axial images were obtained from the base of the skull through the vertex without intravenous contrast. COMPARISON:   09/27/2011. FINDINGS: Brain: No evidence of acute infarction, hemorrhage, hydrocephalus, extra-axial collection or mass lesion/mass effect. Normal for age cerebral volume. No visible white matter disease. Vascular: Calcification of the cavernous internal carotid arteries consistent with cerebrovascular atherosclerotic disease. No signs of intracranial large vessel occlusion. Skull: Normal. Negative for fracture or focal lesion. Sinuses/Orbits: No acute finding. Other: None. Compared with priors, slight progression of atrophy. IMPRESSION: Normal for age cerebral volume.  No acute intracranial findings. No posttraumatic sequelae are evident. Electronically Signed   By: Staci Righter M.D.   On: 01/05/2018 20:17   Dg Chest Port 1 View  Result Date: 01/05/2018 CLINICAL DATA:  Fever EXAM: PORTABLE CHEST 1 VIEW COMPARISON:  12/19/2017 FINDINGS: Heart is enlarged. Diffuse interstitial opacities throughout the lungs, left greater than right, most compatible with chronic interstitial lung disease/fibrosis. No definite acute process. No effusions. IMPRESSION: Cardiomegaly. Chronic interstitial opacities throughout the lungs, likely chronic lung disease/fibrosis. No definite acute process. Electronically Signed   By: Rolm Baptise M.D.   On: 01/05/2018 23:26   Ct Angio Abd/pel W And/or Wo Contrast  Result Date: 01/05/2018 CLINICAL DATA:  Acute abdominal pain. EXAM: CTA ABDOMEN AND PELVIS wITHOUT AND WITH CONTRAST TECHNIQUE: Multidetector CT imaging of the abdomen and pelvis was performed using the standard protocol during bolus administration of intravenous contrast. Multiplanar reconstructed images and MIPs were obtained and reviewed to evaluate the vascular anatomy. CONTRAST:  140mL ISOVUE-370 IOPAMIDOL (ISOVUE-370) INJECTION 76% COMPARISON:  02/13/2006 FINDINGS: VASCULAR Aorta: Normal caliber. Mild infrarenal aortic atherosclerosis. No dissection. Celiac: Widely patent SMA: Widely patent Renals: Single bilaterally,  widely patent IMA: Widely patent Inflow: Widely patent.  No aneurysm or dissection. Proximal Outflow: Widely patent. Veins: Grossly unremarkable. Review of the MIP images confirms the above findings. NON-VASCULAR Lower chest: Scarring in the lung bases. Heart is mildly enlarged. No effusions. Hepatobiliary: No focal hepatic abnormality. Gallbladder unremarkable. Pancreas: No focal abnormality or ductal dilatation. Spleen: No focal abnormality.  Normal size. Adrenals/Urinary Tract: No adrenal abnormality. No focal renal abnormality. No stones or hydronephrosis. Urinary bladder is unremarkable. Stomach/Bowel: Appendix is normal. Stomach, large and small bowel grossly unremarkable. Lymphatic: No adenopathy. Reproductive: Mild central prostate calcifications. Other: No free fluid or free air. Musculoskeletal: Degenerative disc disease throughout the lumbar spine. Postoperative changes in the lower lumbar spine. IMPRESSION: VASCULAR No evidence of aortic aneurysm or dissection. Mild atherosclerotic calcifications in the infrarenal aorta. NON-VASCULAR No acute findings in the abdomen or pelvis. Electronically Signed   By: Rolm Baptise M.D.   On: 01/05/2018 20:30    Scheduled Meds: . aspirin EC  81 mg Oral Daily  . enoxaparin (LOVENOX) injection  40 mg Subcutaneous Daily  . insulin aspart  0-5 Units Subcutaneous Q4H  . lisinopril  20 mg Oral Daily  . nebivolol  5 mg Oral Daily  . Nintedanib  100 mg Oral BID  . pantoprazole  40 mg Oral Daily  . potassium chloride SA  20 mEq Oral Daily  Continuous Infusions: . sodium chloride 75 mL/hr at 01/06/18 0945    Principal Problem:   Syncope and collapse Active Problems:   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[place])   Obstructive sleep apnea   Hypoglycemia due to insulin   Essential hypertension   Diabetes mellitus type 2, with complication, on long term insulin pump ([place])    Time spent: [place]. If  7PM-7AM, please contact night-coverage at [email or web address], password [facility] 01/06/2018, 2:01 PM  LOS: 0 days

## 2018-01-06 NOTE — Progress Notes (Signed)
Patient arrived to room [place] bed 24 from emergency room. Assisted patient to bed by nursing staff.Patient placed on telemetry as ordered.Oriented patient to nursing unit and call bell system.Patient is a risk for fall due to syncopal event prior to admission yellow bracelet,yellow socks and bed alarm set on patient.Patient verbalized understanding about calling for staff assistance prior to getting out of bed.No complaints of pain or discomfort at present time.Will continue to monitor patient.

## 2018-01-06 NOTE — Progress Notes (Addendum)
0640 Received phone call from central telemetry patient heart rate dropping down to 38-40 nonsustained while sleeping and rhythm changing in  and out second degree heart block type 2. Awaken patient asymptomatic blood pressure 118/55 12 lead EKG obtained for rhythm check.Text paged NP X [place] awaiting a response.

## 2018-01-06 NOTE — Evaluation (Signed)
Physical Therapy Evaluation & Discharge Patient Details Name: Shane [name] MRN: [id number] DOB: 28-Nov-1941 Today's Date: 01/06/2018   History of Present Illness  Pt is a 77 y/o male admitted secondary to having a syncopal episode at home. Differential for etiology: arrhythmia versus orthostatic hypotension versus hypoglycemia. PMH including but not limited to HTN, DM, CKD, idiopathic pulmonary fibrosis.    Clinical Impression  Pt presented supine in bed with HOB elevated, awake and willing to participate in therapy session. Pt's spouse present throughout session as well. Prior to admission, pt reported that he was independent with all functional mobility and ADLs. Pt reported that he participates in Pulmonary Rehab twice a week and drives himself to and from. Pt lives with his spouse in a two level house with several steps to enter. Pt ambulated in hallway with supervision for safety and pushing IV pole. Pt with no reports of dizziness or feeling lightheaded throughout. No further acute PT needs identified at this time. PT signing off.     Follow Up Recommendations Supervision - Intermittent;Other (comment)(continue with OP Pulmonary Rehab; consider OP PT for balance)    Equipment Recommendations  None recommended by PT    Recommendations for Other Services       Precautions / Restrictions Precautions Precautions: Fall Restrictions Weight Bearing Restrictions: No      Mobility  Bed Mobility Overal bed mobility: Modified Independent             General bed mobility comments: increased time  Transfers Overall transfer level: Needs assistance Equipment used: None Transfers: Sit to/from Stand Sit to Stand: Supervision         General transfer comment: for safety  Ambulation/Gait Ambulation/Gait assistance: Supervision Ambulation Distance (Feet): 200 Feet Assistive device: (pushed IV pole) Gait Pattern/deviations: Step-through pattern;Decreased stride length;Decreased  dorsiflexion - right;Decreased dorsiflexion - left;Antalgic(consistent R foot drag) Gait velocity: decreased Gait velocity interpretation: Below normal speed for age/gender General Gait Details: pt with mild instability but no overt LOB or need for physical assistance; consistent minimal to zero foot clearance on R during swing phase (pt reports this is his baseline)  Science writer    Modified Rankin (Stroke Patients Only)       Balance Overall balance assessment: Needs assistance Sitting-balance support: Feet supported Sitting balance-Leahy Scale: Normal     Standing balance support: No upper extremity supported;During functional activity Standing balance-Leahy Scale: Fair                               Pertinent Vitals/Pain Pain Assessment: No/denies pain    Home Living Family/patient expects to be discharged to:: Private residence Living Arrangements: Spouse/significant other Available Help at Discharge: Family Type of Home: House Home Access: Stairs to enter Entrance Stairs-Rails: Psychiatric nurse of Steps: 6 Home Layout: Two level;Able to live on main level with bedroom/bathroom Home Equipment: Shower seat;Cane - single point      Prior Function Level of Independence: Independent         Comments: participates in Pulmonary Rehab 2x/week, drives     Hand Dominance        Extremity/Trunk Assessment   Upper Extremity Assessment Upper Extremity Assessment: Overall WFL for tasks assessed    Lower Extremity Assessment Lower Extremity Assessment: Overall WFL for tasks assessed       Communication   Communication: No difficulties  Cognition Arousal/Alertness: Awake/alert  Behavior During Therapy: WFL for tasks assessed/performed Overall Cognitive Status: Within Functional Limits for tasks assessed                                        General Comments      Exercises      Assessment/Plan    PT Assessment Patent does not need any further PT services;All further PT needs can be met in the next venue of care  PT Problem List Decreased balance;Decreased mobility;Decreased coordination       PT Treatment Interventions      PT Goals (Current goals can be found in the Care Plan section)  Acute Rehab PT Goals Patient Stated Goal: return home ASAP    Frequency     Barriers to discharge        Co-evaluation               AM-PAC PT "6 Clicks" Daily Activity  Outcome Measure Difficulty turning over in bed (including adjusting bedclothes, sheets and blankets)?: None Difficulty moving from lying on back to sitting on the side of the bed? : None Difficulty sitting down on and standing up from a chair with arms (e.g., wheelchair, bedside commode, etc,.)?: None Help needed moving to and from a bed to chair (including a wheelchair)?: None Help needed walking in hospital room?: None Help needed climbing 3-5 steps with a railing? : A Little 6 Click Score: 23    End of Session Equipment Utilized During Treatment: Gait belt Activity Tolerance: Patient tolerated treatment well Patient left: in bed;with call bell/phone within reach;with bed alarm set;with family/visitor present Nurse Communication: Mobility status PT Visit Diagnosis: Other abnormalities of gait and mobility (R26.89)    Time: 1422-1440 PT Time Calculation (min) (ACUTE ONLY): 18 min   Charges:   PT Evaluation $PT Eval Low Complexity: 1 Low     PT G Codes:        [place], PT, [place] 01/06/2018, 2:49 PM

## 2018-01-06 NOTE — ED Notes (Signed)
Admitting MD at bedside.

## 2018-01-07 ENCOUNTER — Observation Stay (HOSPITAL_BASED_OUTPATIENT_CLINIC_OR_DEPARTMENT_OTHER): Payer: Medicare Other

## 2018-01-07 DIAGNOSIS — R001 Bradycardia, unspecified: Secondary | ICD-10-CM | POA: Diagnosis not present

## 2018-01-07 DIAGNOSIS — J84112 Idiopathic pulmonary fibrosis: Secondary | ICD-10-CM | POA: Diagnosis not present

## 2018-01-07 DIAGNOSIS — E162 Hypoglycemia, unspecified: Secondary | ICD-10-CM | POA: Diagnosis not present

## 2018-01-07 DIAGNOSIS — E118 Type 2 diabetes mellitus with unspecified complications: Secondary | ICD-10-CM

## 2018-01-07 DIAGNOSIS — Z9641 Presence of insulin pump (external) (internal): Secondary | ICD-10-CM

## 2018-01-07 DIAGNOSIS — I10 Essential (primary) hypertension: Secondary | ICD-10-CM

## 2018-01-07 DIAGNOSIS — I351 Nonrheumatic aortic (valve) insufficiency: Secondary | ICD-10-CM

## 2018-01-07 DIAGNOSIS — R55 Syncope and collapse: Secondary | ICD-10-CM | POA: Diagnosis not present

## 2018-01-07 LAB — ECHOCARDIOGRAM COMPLETE
AO mean calculated velocity dopler: 164 cm/s
AV Area VTI index: 0.51 cm2/m2
AV Area VTI: 1.26 cm2
AV Area mean vel: 1.21 cm2
AV Mean grad: 13 mmHg
AV Peak grad: 22 mmHg
AV VEL mean LVOT/AV: 0.32
AV area mean vel ind: 0.5 cm2/m2
AV peak Index: 0.53
AV pk vel: 234 cm/s
AV vel: 1.22
Ao pk vel: 0.33 m/s
E decel time: 246 msec
E/e' ratio: 11.56
FS: 37 % (ref 28–44)
Height: 72 in
IVS/LV PW RATIO, ED: 0.83
LA ID, A-P, ES: 39 mm
LA diam end sys: 39 mm
LA diam index: 1.63 cm/m2
LA vol A4C: 93 ml
LV E/e' medial: 11.56
LV E/e'average: 11.56
LV PW d: 12 mm — AB (ref 0.6–1.1)
LV e' LATERAL: 8.27 cm/s
LVOT SV: 79 mL
LVOT VTI: 20.8 cm
LVOT area: 3.8 cm2
LVOT diameter: 22 mm
LVOT peak VTI: 0.32 cm
LVOT peak vel: 77.6 cm/s
Lateral S' vel: 12.5 cm/s
MV Dec: 246
MV Peak grad: 4 mmHg
MV pk A vel: 78.6 m/s
MV pk E vel: 95.6 m/s
TAPSE: 19.3 mm
TDI e' lateral: 8.27
TDI e' medial: 7.29
VTI: 64.6 cm
Valve area index: 0.51
Valve area: 1.22 cm2
Weight: 4254 oz

## 2018-01-07 LAB — GLUCOSE, CAPILLARY
Glucose-Capillary: 139 mg/dL — ABNORMAL HIGH (ref 65–99)
Glucose-Capillary: 179 mg/dL — ABNORMAL HIGH (ref 65–99)

## 2018-01-07 NOTE — Care Management Obs Status (Signed)
Hall NOTIFICATION   Patient Details  Name: Shane [name] MRN: [id number] Date of Birth: Dec 14, 1940   Medicare Observation Status Notification Given:  Yes    Carles Collet, RN 01/07/2018, 9:36 AM

## 2018-01-07 NOTE — Progress Notes (Signed)
  Echocardiogram 2D Echocardiogram has been performed.  Shane [name] 01/07/2018, 10:29 AM

## 2018-01-07 NOTE — Progress Notes (Signed)
[name] to be D/C'd Home per MD order.  Discussed with the patient and all questions fully answered.  VSS, Skin clean, dry and intact without evidence of skin break down, no evidence of skin tears noted. IV catheter discontinued intact. Site without signs and symptoms of complications. Dressing and pressure applied.  An After Visit Summary was printed and given to the patient. Patient received prescription.  D/c education completed with patient/family including follow up instructions, medication list, d/c activities limitations if indicated, with other d/c instructions as indicated by MD - patient able to verbalize understanding, all questions fully answered.   Patient instructed to return to ED, call 911, or call MD for any changes in condition.   Patient escorted via [place], and D/C home via private auto.  Richardean Chimera 01/07/2018 1:34 PM

## 2018-01-07 NOTE — Discharge Summary (Signed)
Physician Discharge Summary  Shane [name]:[id number] DOB: March 09, 1941 DOA: 01/05/2018  PCP: Burnard Bunting, MD  Admit date: 01/05/2018 Discharge date: 01/07/2018  Admitted From: Home Disposition:  Home  Recommendations for Outpatient Follow-up and new medication changes:  1. Follow up with Dr. [name] in am 2. Nebivolol has been discontinued due to bardycardia 3. Patient will continue home regime of insulin and will follow up in am with Dr [name].  4. Patient instructed to increase his by mouth intake and continue capillary glucose monitoring.  Home Health: no  Equipment/Devices: no   Discharge Condition: stable CODE STATUS: Full  Diet recommendation:  Heart healthy and diabetic prudent  Brief/Interim Summary: 77 year old male who presented after a syncope episode. Patient does have a significant past medical history for IPF, hypertension, dyslipidemia, diastolic heart failure, type 2 diabetes mellitus, and chronic kidney disease stage III. Patient had an episode of acute loss of consciousness, it was preceded by dizziness and lightheadedness, at that point his capillary glucose was low (110, low for him), posteriorly he ambulated to reach a glass of orange juice, on his way he lost his consciousness. This syncope was brief and transitory, he did experience significant weakness after the event. Apparently he had decrease his by mouth intake related to GI symptoms, attributed to new medication for IPF, and that has resulted in episodes of hypoglycemia. On his initial physical examination blood pressure 118/60, heart rate 52, respiratory rate 15, oxygen saturation 99%. Moist mucous membranes, lungs clear to auscultation bilaterally, heart S1-S2 present rhythmic, the abdomen was soft nontender, no lower extremity edema. Sodium 138, potassium 4.6, chloride 103, bicarbonate 23, glucose 90, BUN 24, creatinine 1.47, white count 12.8, hemolytic 14.7, hematocrit 43.0, platelets 227. Capillary glucose  in the ED 56, required IV dextrose. Urine analysis negative for infection.CT of the abdomen and head CT negative for acute changes. Chest x-ray with bilateral interstitial infiltrates, more predominantly on the left lower, chronic changes. EKG with sinus rhythm, first-degree AV block, left axis deviation, right bundle branch block (chronic changes).   Patient was admitted to the hospital with working diagnosis of syncope likely related to hypoglycemia.   1. Syncope due to hypoglycemia. Patient was admitted to the medical ward, he was placed on a remote telemetry monitor, follow-up electrocardiogram showed 1st degree AV block plus premature with blocked atrial complexes, telemetry showed second-degree type I AV block. Patient was placed on insulin sliding-scale, capillary glucose remained well-controlled 122, 165, 167, 188, 195, 139.   2. First-degree AV block, intermittent second-degree type I AV block. Patient remained asymptomatic, telemetry showed episodes of intermittent type 1, second degree atrial ventricular block, follow-up echocardiography showed normal LV systolic function ejection fraction 60-65%. Discontinue Nebivolol.   3. Type 2 diabetes mellitus. Patient will resume his home regimen with insulin pump, along with insulin aspart sliding scale plus basal insulin with 22 units of insulin Levemir at night. I spoke with Dr. [name] will make further changes as outpatient.   4. Hypertension. Continue lisinopril for blood pressure control. Discontinue Nebivolol due to bradycardia. If further agents are needed will recommend to avoid AV blockers.  5. Pulmonary fibrosis. Patient continue oxygenating well, 98% on room air, for now will continue Nintedanib, will refer for outpatient further evaluation, apparently patient having significant gastrointestinal side effects.   Discharge Diagnoses:  Principal Problem:   Syncope and collapse Active Problems:   IPF (idiopathic pulmonary fibrosis)  (HCC)   Obstructive sleep apnea   Hypoglycemia due to insulin   Essential  hypertension   Diabetes mellitus type 2, with complication, on long term insulin pump (HCC)    Discharge Instructions   Allergies as of 01/07/2018      Reactions   Codeine Hives, Itching   Other Other (See Comments)   Esbriet causes elevated LFTs. D/C on 06/14/17      Medication List    STOP taking these medications   furosemide 40 MG tablet Commonly known as:  LASIX   nebivolol 5 MG tablet Commonly known as:  BYSTOLIC     TAKE these medications   aspirin 81 MG tablet Take 81 mg by mouth daily.   clotrimazole-betamethasone cream Commonly known as:  LOTRISONE APPLY TO AFFECTED AREA TWICE A DAY AS NEEDED FOR IRRITATION   diazepam 5 MG tablet Commonly known as:  VALIUM Take 5 mg by mouth at bedtime as needed (for sleep).   insulin detemir 100 UNIT/ML injection Commonly known as:  LEVEMIR Inject 22 Units into the skin at bedtime.   KLOR-CON M20 20 MEQ tablet Generic drug:  potassium chloride SA Take 20 mEq by mouth daily.   lisinopril 20 MG tablet Commonly known as:  PRINIVIL,ZESTRIL Take 20 mg by mouth daily.   multivitamin tablet Take 1 tablet by mouth daily.   Nintedanib 100 MG Caps Commonly known as:  OFEV Take 1 capsule (100 mg total) by mouth 2 (two) times daily.   pantoprazole 40 MG tablet Commonly known as:  PROTONIX Take 40 mg by mouth daily.   PROAIR HFA 108 (90 Base) MCG/ACT inhaler Generic drug:  albuterol Inhale 2 puffs into the lungs every 4 (four) hours as needed for wheezing or shortness of breath.   PROMETHAZINE VC PLAIN 6.25-5 MG/5ML Syrp Generic drug:  promethazine-phenylephrine Take 5 mLs by mouth every 4 (four) hours as needed for congestion.   V-GO 40 Kit 40 Units by Does not apply route daily. humalog 100 units dispensed every 24 hours. Dispose v-go bag after 24 hours of use.   vitamin C 1000 MG tablet Take 1,000 mg by mouth daily.       Allergies   Allergen Reactions  . Codeine Hives and Itching  . Other Other (See Comments)    Esbriet causes elevated LFTs. D/C on 06/14/17    Consultations:     Procedures/Studies: Dg Chest 2 View  Result Date: 12/19/2017 CLINICAL DATA:  77 year old male with chest pain. EXAM: CHEST  2 VIEW COMPARISON:  Chest radiograph dated 11/16/2017 FINDINGS: Diffuse interstitial coarsening compatible with underlying interstitial lung disease. There is mild increased density in the lingula, likely chronic changes. Pneumonia is less likely but not excluded. Clinical correlation is recommended. Bibasilar atelectatic changes/scarring. Left lateral pleural thickening. No pleural effusion or pneumothorax. Stable cardiac silhouette. Postsurgical changes of cervical spine. No acute osseous pathology. IMPRESSION: Diffuse interstitial coarsening and pleuroparenchymal fibrosis. Airspace density in the lingula likely chronic and less likely pneumonia. Clinical correlation is recommended. Electronically Signed   By: Anner Crete M.D.   On: 12/19/2017 05:21   Ct Head Wo Contrast  Result Date: 01/05/2018 CLINICAL DATA:  Lost consciousness at home today. EXAM: CT HEAD WITHOUT CONTRAST TECHNIQUE: Contiguous axial images were obtained from the base of the skull through the vertex without intravenous contrast. COMPARISON:  09/27/2011. FINDINGS: Brain: No evidence of acute infarction, hemorrhage, hydrocephalus, extra-axial collection or mass lesion/mass effect. Normal for age cerebral volume. No visible white matter disease. Vascular: Calcification of the cavernous internal carotid arteries consistent with cerebrovascular atherosclerotic disease. No signs of intracranial large  vessel occlusion. Skull: Normal. Negative for fracture or focal lesion. Sinuses/Orbits: No acute finding. Other: None. Compared with priors, slight progression of atrophy. IMPRESSION: Normal for age cerebral volume.  No acute intracranial findings. No posttraumatic  sequelae are evident. Electronically Signed   By: Staci Righter M.D.   On: 01/05/2018 20:17   Dg Chest Port 1 View  Result Date: 01/05/2018 CLINICAL DATA:  Fever EXAM: PORTABLE CHEST 1 VIEW COMPARISON:  12/19/2017 FINDINGS: Heart is enlarged. Diffuse interstitial opacities throughout the lungs, left greater than right, most compatible with chronic interstitial lung disease/fibrosis. No definite acute process. No effusions. IMPRESSION: Cardiomegaly. Chronic interstitial opacities throughout the lungs, likely chronic lung disease/fibrosis. No definite acute process. Electronically Signed   By: Rolm Baptise M.D.   On: 01/05/2018 23:26   Dg Abd 2 Views  Result Date: 01/06/2018 CLINICAL DATA:  LEFT lower quadrant abdominal pain. EXAM: ABDOMEN - 2 VIEW COMPARISON:  None. FINDINGS: The bowel gas pattern is normal. There is no evidence of free air. No radio-opaque calculi or other significant radiographic abnormality is seen. There is iodinated contrast in the bladder from prior CT. Lumbar spondylosis. IMPRESSION: Negative. Electronically Signed   By: Staci Righter M.D.   On: 01/06/2018 17:46   Ct Angio Abd/pel W And/or Wo Contrast  Result Date: 01/05/2018 CLINICAL DATA:  Acute abdominal pain. EXAM: CTA ABDOMEN AND PELVIS wITHOUT AND WITH CONTRAST TECHNIQUE: Multidetector CT imaging of the abdomen and pelvis was performed using the standard protocol during bolus administration of intravenous contrast. Multiplanar reconstructed images and MIPs were obtained and reviewed to evaluate the vascular anatomy. CONTRAST:  158m ISOVUE-370 IOPAMIDOL (ISOVUE-370) INJECTION 76% COMPARISON:  02/13/2006 FINDINGS: VASCULAR Aorta: Normal caliber. Mild infrarenal aortic atherosclerosis. No dissection. Celiac: Widely patent SMA: Widely patent Renals: Single bilaterally, widely patent IMA: Widely patent Inflow: Widely patent.  No aneurysm or dissection. Proximal Outflow: Widely patent. Veins: Grossly unremarkable. Review of the MIP  images confirms the above findings. NON-VASCULAR Lower chest: Scarring in the lung bases. Heart is mildly enlarged. No effusions. Hepatobiliary: No focal hepatic abnormality. Gallbladder unremarkable. Pancreas: No focal abnormality or ductal dilatation. Spleen: No focal abnormality.  Normal size. Adrenals/Urinary Tract: No adrenal abnormality. No focal renal abnormality. No stones or hydronephrosis. Urinary bladder is unremarkable. Stomach/Bowel: Appendix is normal. Stomach, large and small bowel grossly unremarkable. Lymphatic: No adenopathy. Reproductive: Mild central prostate calcifications. Other: No free fluid or free air. Musculoskeletal: Degenerative disc disease throughout the lumbar spine. Postoperative changes in the lower lumbar spine. IMPRESSION: VASCULAR No evidence of aortic aneurysm or dissection. Mild atherosclerotic calcifications in the infrarenal aorta. NON-VASCULAR No acute findings in the abdomen or pelvis. Electronically Signed   By: KRolm BaptiseM.D.   On: 01/05/2018 20:30       Subjective: Patient is feeling better, no nausea or vomiting, no chest pain, no dyspnea. Tolerating po well.   Discharge Exam: Vitals:   01/07/18 0634 01/07/18 0900  BP: (!) 121/57 (!) 142/63  Pulse: 62   Resp: 20   Temp: 98 F (36.7 C)   SpO2: 98%    Vitals:   01/06/18 2009 01/07/18 0206 01/07/18 0634 01/07/18 0900  BP: (!) 147/65 (!) 132/59 (!) 121/57 (!) 142/63  Pulse: 71 64 62   Resp:  16 20   Temp: 98.6 F (37 C) (!) 97.3 F (36.3 C) 98 F (36.7 C)   TempSrc: Oral Oral Oral   SpO2: 96% 99% 98%   Weight:      Height:  General: Not in pain or dyspnea, deconditioned Neurology: Awake and alert, non focal  E ENT: no pallor, no icterus, oral mucosa moist Cardiovascular: No JVD. S1-S2 present, rhythmic, no gallops, rubs, or murmurs. No lower extremity edema. Pulmonary: vesicular breath sounds bilaterally, adequate air movement, no wheezing, rhonchi or rales. Gastrointestinal.  Abdomen flat, no organomegaly, non tender, no rebound or guarding Skin. No rashes Musculoskeletal: no joint deformities   The results of significant diagnostics from this hospitalization (including imaging, microbiology, ancillary and laboratory) are listed below for reference.     Microbiology: No results found for this or any previous visit (from the past 240 hour(s)).   Labs: BNP (last 3 results) Recent Labs    12/19/17 0434  BNP 412.8*   Basic Metabolic Panel: Recent Labs  Lab 01/05/18 1805 01/06/18 0339  NA 138 135  K 4.6 4.7  CL 103 102  CO2 23 22  GLUCOSE 90 142*  BUN 24* 23*  CREATININE 1.47* 1.48*  CALCIUM 9.3 8.9   Liver Function Tests: Recent Labs  Lab 01/05/18 1805  AST 33  ALT 29  ALKPHOS 57  BILITOT 0.9  PROT 7.1  ALBUMIN 3.4*   Recent Labs  Lab 01/05/18 1805  LIPASE 31   No results for input(s): AMMONIA in the last 168 hours. CBC: Recent Labs  Lab 01/05/18 1805 01/06/18 0339  WBC 12.8* 8.9  NEUTROABS 9.7*  --   HGB 14.7 13.4  HCT 43.0 39.9  MCV 96.0 96.8  PLT 227 215   Cardiac Enzymes: Recent Labs  Lab 01/06/18 1007  TROPONINI <0.03   BNP: Invalid input(s): POCBNP CBG: Recent Labs  Lab 01/06/18 0746 01/06/18 1200 01/06/18 1649 01/06/18 2032 01/07/18 0744  GLUCAP 165* 167* 188* 195* 139*   D-Dimer No results for input(s): DDIMER in the last 72 hours. Hgb A1c No results for input(s): HGBA1C in the last 72 hours. Lipid Profile No results for input(s): CHOL, HDL, LDLCALC, TRIG, CHOLHDL, LDLDIRECT in the last 72 hours. Thyroid function studies No results for input(s): TSH, T4TOTAL, T3FREE, THYROIDAB in the last 72 hours.  Invalid input(s): FREET3 Anemia work up No results for input(s): VITAMINB12, FOLATE, FERRITIN, TIBC, IRON, RETICCTPCT in the last 72 hours. Urinalysis    Component Value Date/Time   COLORURINE YELLOW 01/06/2018 0125   APPEARANCEUR CLEAR 01/06/2018 0125   LABSPEC >1.046 (H) 01/06/2018 0125    PHURINE 5.0 01/06/2018 0125   GLUCOSEU NEGATIVE 01/06/2018 0125   HGBUR NEGATIVE 01/06/2018 0125   BILIRUBINUR NEGATIVE 01/06/2018 0125   KETONESUR NEGATIVE 01/06/2018 0125   PROTEINUR NEGATIVE 01/06/2018 0125   UROBILINOGEN 1.0 09/27/2011 1112   NITRITE NEGATIVE 01/06/2018 0125   LEUKOCYTESUR NEGATIVE 01/06/2018 0125   Sepsis Labs Invalid input(s): PROCALCITONIN,  WBC,  LACTICIDVEN Microbiology No results found for this or any previous visit (from the past 240 hour(s)).   Time coordinating discharge: 45 minutes  SIGNED:   Tawni Millers, MD  [facility] 01/07/2018, 11:40 AM Pager [phone]  If 7PM-7AM, please contact night-coverage [email or web address] Password [email or web address]

## 2018-01-08 ENCOUNTER — Telehealth: Payer: Self-pay | Admitting: Internal Medicine

## 2018-01-08 ENCOUNTER — Inpatient Hospital Stay: Admission: RE | Admit: 2018-01-08 | Payer: Medicare Other | Source: Ambulatory Visit

## 2018-01-08 NOTE — Telephone Encounter (Signed)
They did different CT's . They did not do HRCT chest. We will need HRCT chest. However, given the fall etc., we can wait for things to cool down and then we can do later. We can decide at followup.   Dr. Brand Males, M.D., [facility].C.P Pulmonary and Critical Care Medicine Staff Physician, [place] Director - Interstitial Lung Disease  Program  Pulmonary [place] [facility] [place], [place], [address]  Pager: [phone], If no answer or between  15:00h - 7:00h: call [phone] Telephone: [phone]

## 2018-01-08 NOTE — Telephone Encounter (Signed)
Spoke with pt's wife. She is aware of MR's response. Nothing further was needed.

## 2018-01-08 NOTE — Telephone Encounter (Signed)
Spoke with patient's wife [name]. She stated that the patient had been admitted over the weekend and had several scans done. She wants to know if the patient needs to have the CT chest high res that had been scheduled for today.   Patient had a CT angio abdomen pelvis and CT head w/o contrast.   MR, please advise.

## 2018-01-09 ENCOUNTER — Telehealth (HOSPITAL_COMMUNITY): Payer: Self-pay | Admitting: Internal Medicine

## 2018-01-09 ENCOUNTER — Encounter (HOSPITAL_COMMUNITY): Payer: Self-pay

## 2018-01-09 DIAGNOSIS — Z7982 Long term (current) use of aspirin: Secondary | ICD-10-CM | POA: Insufficient documentation

## 2018-01-09 DIAGNOSIS — G4730 Sleep apnea, unspecified: Secondary | ICD-10-CM | POA: Insufficient documentation

## 2018-01-09 DIAGNOSIS — I129 Hypertensive chronic kidney disease with stage 1 through stage 4 chronic kidney disease, or unspecified chronic kidney disease: Secondary | ICD-10-CM | POA: Insufficient documentation

## 2018-01-09 DIAGNOSIS — Z79899 Other long term (current) drug therapy: Secondary | ICD-10-CM | POA: Insufficient documentation

## 2018-01-09 DIAGNOSIS — E1122 Type 2 diabetes mellitus with diabetic chronic kidney disease: Secondary | ICD-10-CM | POA: Insufficient documentation

## 2018-01-09 DIAGNOSIS — K219 Gastro-esophageal reflux disease without esophagitis: Secondary | ICD-10-CM | POA: Insufficient documentation

## 2018-01-09 DIAGNOSIS — J84112 Idiopathic pulmonary fibrosis: Secondary | ICD-10-CM | POA: Insufficient documentation

## 2018-01-09 DIAGNOSIS — Z794 Long term (current) use of insulin: Secondary | ICD-10-CM | POA: Insufficient documentation

## 2018-01-09 DIAGNOSIS — E785 Hyperlipidemia, unspecified: Secondary | ICD-10-CM | POA: Insufficient documentation

## 2018-01-09 DIAGNOSIS — N189 Chronic kidney disease, unspecified: Secondary | ICD-10-CM | POA: Insufficient documentation

## 2018-01-11 ENCOUNTER — Encounter (HOSPITAL_COMMUNITY): Payer: Self-pay

## 2018-01-16 ENCOUNTER — Encounter (HOSPITAL_COMMUNITY)
Admission: RE | Admit: 2018-01-16 | Discharge: 2018-01-16 | Disposition: A | Discharge status: Home / Self Care | Payer: Self-pay | Source: Ambulatory Visit | Attending: Internal Medicine | Admitting: Internal Medicine

## 2018-01-18 ENCOUNTER — Encounter (HOSPITAL_COMMUNITY)
Admission: RE | Admit: 2018-01-18 | Discharge: 2018-01-18 | Disposition: A | Discharge status: Home / Self Care | Payer: Self-pay | Source: Ambulatory Visit | Attending: Internal Medicine | Admitting: Internal Medicine

## 2018-01-23 ENCOUNTER — Encounter (HOSPITAL_COMMUNITY)
Admission: RE | Admit: 2018-01-23 | Discharge: 2018-01-23 | Disposition: A | Discharge status: Home / Self Care | Payer: Self-pay | Source: Ambulatory Visit | Attending: Internal Medicine | Admitting: Internal Medicine

## 2018-01-25 ENCOUNTER — Encounter (HOSPITAL_COMMUNITY)
Admission: RE | Admit: 2018-01-25 | Discharge: 2018-01-25 | Disposition: A | Discharge status: Home / Self Care | Payer: Self-pay | Source: Ambulatory Visit | Attending: Internal Medicine | Admitting: Internal Medicine

## 2018-01-30 ENCOUNTER — Emergency Department (HOSPITAL_COMMUNITY)
Admission: EM | Admit: 2018-01-30 | Discharge: 2018-01-30 | Disposition: A | Discharge status: Home / Self Care | Payer: Medicare Other | Attending: Emergency Medicine | Admitting: Emergency Medicine

## 2018-01-30 ENCOUNTER — Other Ambulatory Visit: Payer: Self-pay

## 2018-01-30 ENCOUNTER — Encounter (HOSPITAL_COMMUNITY): Payer: Self-pay | Admitting: Emergency Medicine

## 2018-01-30 ENCOUNTER — Encounter (HOSPITAL_COMMUNITY)
Admission: RE | Admit: 2018-01-30 | Discharge: 2018-01-30 | Disposition: A | Discharge status: Home / Self Care | Payer: Self-pay | Source: Ambulatory Visit | Attending: Internal Medicine | Admitting: Internal Medicine

## 2018-01-30 DIAGNOSIS — R42 Dizziness and giddiness: Secondary | ICD-10-CM | POA: Insufficient documentation

## 2018-01-30 DIAGNOSIS — R55 Syncope and collapse: Secondary | ICD-10-CM | POA: Insufficient documentation

## 2018-01-30 DIAGNOSIS — T782XXA Anaphylactic shock, unspecified, initial encounter: Secondary | ICD-10-CM | POA: Diagnosis not present

## 2018-01-30 DIAGNOSIS — Z79899 Other long term (current) drug therapy: Secondary | ICD-10-CM | POA: Insufficient documentation

## 2018-01-30 DIAGNOSIS — L299 Pruritus, unspecified: Secondary | ICD-10-CM | POA: Diagnosis not present

## 2018-01-30 DIAGNOSIS — Z794 Long term (current) use of insulin: Secondary | ICD-10-CM | POA: Insufficient documentation

## 2018-01-30 DIAGNOSIS — N189 Chronic kidney disease, unspecified: Secondary | ICD-10-CM | POA: Diagnosis not present

## 2018-01-30 DIAGNOSIS — E1122 Type 2 diabetes mellitus with diabetic chronic kidney disease: Secondary | ICD-10-CM | POA: Insufficient documentation

## 2018-01-30 DIAGNOSIS — Z7982 Long term (current) use of aspirin: Secondary | ICD-10-CM | POA: Diagnosis not present

## 2018-01-30 DIAGNOSIS — I129 Hypertensive chronic kidney disease with stage 1 through stage 4 chronic kidney disease, or unspecified chronic kidney disease: Secondary | ICD-10-CM | POA: Diagnosis not present

## 2018-01-30 DIAGNOSIS — R110 Nausea: Secondary | ICD-10-CM | POA: Diagnosis not present

## 2018-01-30 DIAGNOSIS — R21 Rash and other nonspecific skin eruption: Secondary | ICD-10-CM | POA: Diagnosis present

## 2018-01-30 MED ORDER — SODIUM CHLORIDE 0.9 % IV BOLUS (SEPSIS)
500.0000 mL | Freq: Once | INTRAVENOUS | Status: AC
  Administered 2018-01-30: 500 mL via INTRAVENOUS

## 2018-01-30 MED ORDER — EPINEPHRINE 0.3 MG/0.3ML IJ SOAJ: 0.3000 mg | Freq: Once | INTRAMUSCULAR | 1 refills | Status: AC

## 2018-01-30 NOTE — ED Provider Notes (Signed)
[facility] EMERGENCY DEPARTMENT Provider Note   CSN: [id number] Arrival date & time: 01/30/18  1618     History   Chief Complaint Chief Complaint  Patient presents with  . Allergic Reaction  . Near Syncope    HPI Shane [name] is a 77 y.o. male.  HPI  77 year old male presents with an allergic reaction.  History is taken from patient, wife, and EMS.  Patient called due to acute rash and itching as well as dizziness and lip tingling/swelling.  He states he is talking with his wife on the phone a couple hours ago started to feel dizzy and had lower lip tingling and swelling.  He did not feel any tongue swelling.  EMS saw tongue swelling but feels it is significantly improved. He also had back and leg itching.  He states this feels similar to when he had an allergic reaction several years ago but no cause was ever found.  He states he ate a [employer] sandwich that his son had ordered and gave to him so he does not know what was on it.  This was about 30 minutes or less prior to these symptoms.  He otherwise has had no new medicines or medication changes and no new detergents or pets.  He was given IM epinephrine, 50 mg IV Benadryl, and 4 mg IV Zofran for nausea.  He states his symptoms have essentially resolved.  EMS noted that he was orthostatic both pre-and post epinephrine.  Patient states that prior to these symptoms starting early in the afternoon he felt quite fine and has been feeling fine for a couple weeks since his admission.  He states he checked his sugar during this episode and it was 140. He has felt generalized weakness since onset of these symptoms and this currently continues. No focal weakness.  Past Medical History:  Diagnosis Date  . Cancer ([place])    ling  . Cataract   . Chest pain   . Chronic kidney disease    d/t DM  . Diabetes mellitus    Vgo disposal insulin bolus  simular to insulin pump  . Dyspnea   . GERD (gastroesophageal reflux disease)   .  Hyperlipidemia   . Hypertension   . 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 Neuromuscular disorder ([place])   . PONV (postoperative nausea and vomiting)   . Sleep apnea     uses cpap asked to bring mask and tubing    Patient Active Problem List   Diagnosis Date Noted  . Hypoglycemia due to insulin 01/06/2018  . Essential hypertension 01/06/2018  . Diabetes mellitus type 2, with complication, on long term insulin pump ([place]) 01/06/2018  . Syncope and collapse 01/05/2018  . Encounter for therapeutic drug monitoring 06/29/2017  . Obstructive sleep apnea 05/05/2017  . Cancer ([place])   . 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[place]) 01/05/2017  . Abnormal chest x-ray 10/12/2016  . 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 ANTERIOR FUSION CERVICAL SPINE  2012  . CARDIAC CATHETERIZATION  2011  . CARDIAC CATHETERIZATION N/A 11/09/2016   Procedure: Right Heart Cath;  Surgeon: Belva Crome, MD;  Location: [facility];  Service: Cardiovascular;  Laterality: N/A;  . carpel tunnel     left wrist  . CATARACT EXTRACTION W/ INTRAOCULAR LENS  IMPLANT, BILATERAL  2013  . CERVICAL LAMINECTOMY  2012  . COLONOSCOPY N/A 01/14/2013   Procedure: COLONOSCOPY;  Surgeon:  Irene Shipper, MD;  Location: [facility];  Service: Endoscopy;  Laterality: N/A;  . EYE SURGERY    . [place]   left  . LUMBAR LAMINECTOMY  2003  . LUNG BIOPSY Left 12/26/2016   Procedure: LUNG BIOPSY;  Surgeon: [name], MD;  Location: [facility];  Service: Thoracic;  Laterality: Left;  . POSTERIOR FUSION CERVICAL SPINE  2012  . TRIGGER FINGER RELEASE  2011   4th finger left hand  . VIDEO ASSISTED THORACOSCOPY Left 12/26/2016   Procedure: VIDEO ASSISTED THORACOSCOPY;  Surgeon: [name], MD;  Location: [facility];  Service: Thoracic;  Laterality: Left;  [place] VIDEO BRONCHOSCOPY N/A 12/26/2016   Procedure: VIDEO BRONCHOSCOPY;  Surgeon: [name], MD;   Location: [facility];  Service: Thoracic;  Laterality: N/A;       Home Medications    Prior to Admission medications   Medication Sig Start Date End Date Taking? Authorizing Provider  Ascorbic Acid (VITAMIN C) 1000 MG tablet Take 1,000 mg by mouth daily.   Yes [provider]  aspirin 81 MG tablet Take 81 mg by mouth daily.     Yes [provider]  clotrimazole-betamethasone (LOTRISONE) cream APPLY TO AFFECTED AREA TWICE A DAY AS NEEDED FOR IRRITATION 01/16/17  Yes [provider]  diazepam (VALIUM) 5 MG tablet Take 5 mg by mouth at bedtime as needed (for sleep).    Yes [provider]  insulin detemir (LEVEMIR) 100 UNIT/ML injection Inject 22 Units into the skin at bedtime.   Yes [provider]  Insulin Pump Disposable (V-GO 40) KIT 40 Units by Does not apply route daily. humalog 100 units dispensed every 24 hours. Dispose v-go bag after 24 hours of use.    Yes [provider]  KLOR-CON M20 20 MEQ tablet Take 20 mEq by mouth daily.  09/26/16  Yes [provider]  lisinopril (PRINIVIL,ZESTRIL) 20 MG tablet Take 20 mg by mouth daily.  09/08/16  Yes [provider]  Multiple Vitamin (MULTIVITAMIN) tablet Take 1 tablet by mouth daily.     Yes [provider]  Nintedanib (OFEV) 100 MG CAPS Take 1 capsule (100 mg total) by mouth 2 (two) times daily. 11/09/17  Yes Brand Males, MD  pantoprazole (PROTONIX) 40 MG tablet Take 40 mg by mouth daily.     Yes [provider]  PROAIR HFA 108 (90 Base) MCG/ACT inhaler Inhale 2 puffs into the lungs every 4 (four) hours as needed for wheezing or shortness of breath.  10/03/16  Yes [provider]  promethazine-phenylephrine (PROMETHAZINE VC PLAIN) 6.25-5 MG/5ML SYRP Take 5 mLs by mouth every 4 (four) hours as needed for congestion.   Yes [provider]    Family History Family History  Problem Relation Age of Onset  . Diabetes Mellitus II Mother     . Emphysema Father 59  . Colon cancer Neg Hx   . Esophageal cancer Neg Hx   . Rectal cancer Neg Hx   . Stomach cancer Neg Hx     Social History Social History   Tobacco Use  . Smoking status: Never Smoker  . Smokeless tobacco: Never Used  Substance Use Topics  . Alcohol use: No  . Drug use: No     Allergies   Codeine and Other   Review of Systems Review of Systems  HENT: Positive for facial swelling.   Respiratory: Negative for shortness of breath.   Gastrointestinal: Positive for nausea. Negative  for vomiting.  Skin: Positive for rash.  Neurological: Positive for dizziness and weakness. Negative for headaches.  All other systems reviewed and are negative.    Physical Exam Updated Vital Signs BP 114/68   Pulse 65   Temp (!) 97.5 F (36.4 C) (Oral)   Resp 19   Ht 6' (1.829 m)   Wt 116.1 kg (256 lb)   SpO2 97%   BMI 34.72 kg/m   Physical Exam  Constitutional: He is oriented to person, place, and time. He appears well-developed and well-nourished.  Morbidly obese  HENT:  Head: Normocephalic and atraumatic.  Right Ear: External ear normal.  Left Ear: External ear normal.  Nose: Nose normal.  No tongue or lip swelling noted by me or wife  Eyes: EOM are normal. Pupils are equal, round, and reactive to light. Right eye exhibits no discharge. Left eye exhibits no discharge.  Neck: Neck supple.  Cardiovascular: Normal rate, regular rhythm and normal heart sounds.  Pulmonary/Chest: Effort normal and breath sounds normal. No stridor. He has no wheezes.  Abdominal: Soft. There is no tenderness.  Musculoskeletal: He exhibits no edema.  Neurological: He is alert and oriented to person, place, and time.  Awake, alert but appears sleepy. CN 3-12 grossly intact. 5/5 strength in all 4 extremities. Grossly normal sensation. Normal finger to nose.   Skin: Skin is warm and dry. Rash (splotchy rash noted to upper chest, mild) noted.  Nursing note and vitals  reviewed.    ED Treatments / Results  Labs (all labs ordered are listed, but only abnormal results are displayed) Labs Reviewed - No data to display  EKG  EKG Interpretation  Date/Time:  Tuesday January 30 2018 16:23:51 EST Ventricular Rate:  78 PR Interval:    QRS Duration: 172 QT Interval:  456 QTC Calculation: 520 R Axis:   -68 Text Interpretation:  Sinus rhythm RBBB and LAFB Artifact no significant change since Jan 06 2018 Confirmed by Sherwood Gambler [phone]) on 01/30/2018 4:56:47 PM       Radiology No results found.  Procedures Procedures (including critical care time)  Medications Ordered in ED Medications  sodium chloride 0.9 % bolus 500 mL (0 mLs Intravenous Stopped 01/30/18 1800)     Initial Impression / Assessment and Plan / ED Course  I have reviewed the triage vital signs and the nursing notes.  Pertinent labs & imaging results that were available during my care of the patient were reviewed by me and considered in my medical decision making (see chart for details).     Patient was observed in the ED for about 3 hours with no relapse or recurrence of symptoms.  In fact he is now feeling completely normal.  I think that his symptoms are consistent with anaphylaxis and he will be discharged with IM EpiPen.  Otherwise instructed to use Benadryl if his rash or itching were to return.  It is unclear exactly what he is allergic to possibly a food source given he ate a [employer] sandwich with unknown ingredients just prior to this happening.  Given he is completely asymptomatic with benign vital signs of that he stable for discharge home.  He has diabetes and given questionable evidence of prednisone being helpful and anaphylaxis for rebound anaphylaxis I think it is reasonable to skip this as it would likely cause more harm than good with hyperglycemia.  Discussed with patient who agrees.  Discharge home with return precautions.  Final Clinical Impressions(s) / ED  Diagnoses  Final diagnoses:  Anaphylaxis, initial encounter    ED Discharge Orders        Ordered    EPINEPHrine 0.3 mg/0.3 mL IJ SOAJ injection   Once     01/30/18 1916       Sherwood Gambler, MD 01/31/18 [phone]

## 2018-01-30 NOTE — ED Triage Notes (Signed)
Per [facility] patient coming from home complaining of symptoms of a allergic reaction after eating subway today. No known allergies. Similar episode several years ago. Facial swelling, tong swelling. Denies SOB. Orthostatic hypotension upon standing and near syncopal episode with ems. IM Epi 0.3 administered by ems. Patient then  Had another near syncopal episode. Soon after epi administration. Patient alert and oriented x 4 on arrival. Patient also received 50 mg benadryl, 4 mg zofran, and 400 cc of NS. Right bbb on ekg. All 4 extremities weak. Left arm drift noted. No other neuro deficits.

## 2018-01-30 NOTE — ED Notes (Signed)
Assisted patient to bedside commode

## 2018-02-01 ENCOUNTER — Encounter (HOSPITAL_COMMUNITY): Admission: RE | Admit: 2018-02-01 | Payer: Self-pay | Source: Ambulatory Visit

## 2018-02-06 ENCOUNTER — Encounter (HOSPITAL_COMMUNITY)
Admission: RE | Admit: 2018-02-06 | Discharge: 2018-02-06 | Disposition: A | Discharge status: Home / Self Care | Payer: Self-pay | Source: Ambulatory Visit | Attending: Internal Medicine | Admitting: Internal Medicine

## 2018-02-06 DIAGNOSIS — Z7982 Long term (current) use of aspirin: Secondary | ICD-10-CM | POA: Insufficient documentation

## 2018-02-06 DIAGNOSIS — E1122 Type 2 diabetes mellitus with diabetic chronic kidney disease: Secondary | ICD-10-CM | POA: Insufficient documentation

## 2018-02-06 DIAGNOSIS — Z794 Long term (current) use of insulin: Secondary | ICD-10-CM | POA: Insufficient documentation

## 2018-02-06 DIAGNOSIS — E785 Hyperlipidemia, unspecified: Secondary | ICD-10-CM | POA: Insufficient documentation

## 2018-02-06 DIAGNOSIS — J84112 Idiopathic pulmonary fibrosis: Secondary | ICD-10-CM | POA: Insufficient documentation

## 2018-02-06 DIAGNOSIS — G4730 Sleep apnea, unspecified: Secondary | ICD-10-CM | POA: Insufficient documentation

## 2018-02-06 DIAGNOSIS — I129 Hypertensive chronic kidney disease with stage 1 through stage 4 chronic kidney disease, or unspecified chronic kidney disease: Secondary | ICD-10-CM | POA: Insufficient documentation

## 2018-02-06 DIAGNOSIS — K219 Gastro-esophageal reflux disease without esophagitis: Secondary | ICD-10-CM | POA: Insufficient documentation

## 2018-02-06 DIAGNOSIS — Z79899 Other long term (current) drug therapy: Secondary | ICD-10-CM | POA: Insufficient documentation

## 2018-02-06 DIAGNOSIS — N189 Chronic kidney disease, unspecified: Secondary | ICD-10-CM | POA: Insufficient documentation

## 2018-02-07 ENCOUNTER — Telehealth: Payer: Self-pay | Admitting: Internal Medicine

## 2018-02-07 DIAGNOSIS — R0602 Shortness of breath: Secondary | ICD-10-CM

## 2018-02-07 NOTE — Telephone Encounter (Signed)
MR please advise would you be ok with [place] having the patient rescheduled for the HRCT. Patients wife states they want to know what is going on with him. Thanks.

## 2018-02-07 NOTE — Telephone Encounter (Signed)
Spoke with patients wife and she stated that patient was supposed to be scheduled for an MRI on 2.4.19 but they had to cancel it due to patient being in the hospital. Patients wife that's that he is still having some bad days and is wanting to have the MRI done so that they can get some answers. MR please advise are you ok with [place] rescheduling this so they can get it done, thanks.

## 2018-02-07 NOTE — Telephone Encounter (Signed)
He can do the HRCT before he sees me 03/26/18  Reviewed records of feb admission  - they think he had low sguar and also slow heart rate  - so they stopped one of the meds - they hospitlaist are also wondring about role of ofev in the low sugar etc., - will address at followup   Dr. Brand Males, M.D., [facility].C.P Pulmonary and Critical Care Medicine Staff Physician, [place] Director - Interstitial Lung Disease  Program  Pulmonary [place] at [place], [place], [address]  Pager: [phone], If no answer or between  15:00h - 7:00h: call [phone] Telephone: [phone]

## 2018-02-07 NOTE — Telephone Encounter (Signed)
It is not MRI . I wanted a HRCT chest. Ok we can wait on HRCT. Please ensure followup  Dr. Brand Males, M.D., [facility].C.P Pulmonary and Critical Care Medicine Staff Physician, [place] Director - Interstitial Lung Disease  Program  Pulmonary [place] at [place], [place], [address]  Pager: [phone], If no answer or between  15:00h - 7:00h: call [phone] Telephone: [phone]

## 2018-02-08 ENCOUNTER — Encounter (HOSPITAL_COMMUNITY)
Admission: RE | Admit: 2018-02-08 | Discharge: 2018-02-08 | Disposition: A | Discharge status: Home / Self Care | Payer: Self-pay | Source: Ambulatory Visit | Attending: Internal Medicine | Admitting: Internal Medicine

## 2018-02-08 NOTE — Telephone Encounter (Signed)
Called patients wife, was on hold for over 7 minutes. Unable to talk to wife.   MR wanted patient to have HRCT not the MRI. Order placed for HRCT to be done. Patient is to see MR on 4.22.19 and he is wanting this to be done before that appointment. Please let patients wife know this, if she has any other questions regarding this I can talk to her.

## 2018-02-10 ENCOUNTER — Encounter: Payer: Self-pay | Admitting: Internal Medicine

## 2018-02-12 NOTE — Telephone Encounter (Signed)
Called pt and advised message from the provider. Pt understood and verbalized understanding. Nothing further is needed.   Appt Date/Time/Location   03/05/2018  1:00 PM  at  [facility]  PreCertification Number (if available)

## 2018-02-13 ENCOUNTER — Encounter (HOSPITAL_COMMUNITY)
Admission: RE | Admit: 2018-02-13 | Discharge: 2018-02-13 | Disposition: A | Discharge status: Home / Self Care | Payer: Self-pay | Source: Ambulatory Visit | Attending: Internal Medicine | Admitting: Internal Medicine

## 2018-02-15 ENCOUNTER — Encounter (HOSPITAL_COMMUNITY): Payer: Self-pay

## 2018-02-18 ENCOUNTER — Emergency Department (HOSPITAL_COMMUNITY): Payer: Medicare Other

## 2018-02-18 ENCOUNTER — Other Ambulatory Visit: Payer: Self-pay

## 2018-02-18 ENCOUNTER — Encounter (HOSPITAL_COMMUNITY): Payer: Self-pay | Admitting: Emergency Medicine

## 2018-02-18 ENCOUNTER — Emergency Department (HOSPITAL_COMMUNITY)
Admission: EM | Admit: 2018-02-18 | Discharge: 2018-02-18 | Disposition: A | Discharge status: Home / Self Care | Payer: Medicare Other | Attending: Emergency Medicine | Admitting: Emergency Medicine

## 2018-02-18 DIAGNOSIS — R42 Dizziness and giddiness: Secondary | ICD-10-CM | POA: Insufficient documentation

## 2018-02-18 DIAGNOSIS — R55 Syncope and collapse: Secondary | ICD-10-CM

## 2018-02-18 DIAGNOSIS — R2240 Localized swelling, mass and lump, unspecified lower limb: Secondary | ICD-10-CM | POA: Insufficient documentation

## 2018-02-18 DIAGNOSIS — Z79899 Other long term (current) drug therapy: Secondary | ICD-10-CM | POA: Insufficient documentation

## 2018-02-18 DIAGNOSIS — E1122 Type 2 diabetes mellitus with diabetic chronic kidney disease: Secondary | ICD-10-CM | POA: Insufficient documentation

## 2018-02-18 DIAGNOSIS — E162 Hypoglycemia, unspecified: Secondary | ICD-10-CM | POA: Insufficient documentation

## 2018-02-18 DIAGNOSIS — I129 Hypertensive chronic kidney disease with stage 1 through stage 4 chronic kidney disease, or unspecified chronic kidney disease: Secondary | ICD-10-CM | POA: Diagnosis not present

## 2018-02-18 DIAGNOSIS — Z7982 Long term (current) use of aspirin: Secondary | ICD-10-CM | POA: Insufficient documentation

## 2018-02-18 DIAGNOSIS — R1012 Left upper quadrant pain: Secondary | ICD-10-CM | POA: Insufficient documentation

## 2018-02-18 DIAGNOSIS — N189 Chronic kidney disease, unspecified: Secondary | ICD-10-CM | POA: Insufficient documentation

## 2018-02-18 DIAGNOSIS — Z794 Long term (current) use of insulin: Secondary | ICD-10-CM | POA: Insufficient documentation

## 2018-02-18 LAB — CBC
HCT: 42.7 % (ref 39.0–52.0)
Hemoglobin: 14.5 g/dL (ref 13.0–17.0)
MCH: 32.2 pg (ref 26.0–34.0)
MCHC: 34 g/dL (ref 30.0–36.0)
MCV: 94.7 fL (ref 78.0–100.0)
Platelets: 222 10*3/uL (ref 150–400)
RBC: 4.51 MIL/uL (ref 4.22–5.81)
RDW: 13.4 % (ref 11.5–15.5)
WBC: 10.5 10*3/uL (ref 4.0–10.5)

## 2018-02-18 LAB — I-STAT CHEM 8, ED
BUN: 36 mg/dL — ABNORMAL HIGH (ref 6–20)
Calcium, Ion: 1.18 mmol/L (ref 1.15–1.40)
Chloride: 106 mmol/L (ref 101–111)
Creatinine, Ser: 1.7 mg/dL — ABNORMAL HIGH (ref 0.61–1.24)
Glucose, Bld: 126 mg/dL — ABNORMAL HIGH (ref 65–99)
HCT: 41 % (ref 39.0–52.0)
Hemoglobin: 13.9 g/dL (ref 13.0–17.0)
Potassium: 4.3 mmol/L (ref 3.5–5.1)
Sodium: 140 mmol/L (ref 135–145)
TCO2: 25 mmol/L (ref 22–32)

## 2018-02-18 LAB — BASIC METABOLIC PANEL
Anion gap: 8 (ref 5–15)
BUN: 36 mg/dL — ABNORMAL HIGH (ref 6–20)
CO2: 24 mmol/L (ref 22–32)
Calcium: 8.8 mg/dL — ABNORMAL LOW (ref 8.9–10.3)
Chloride: 106 mmol/L (ref 101–111)
Creatinine, Ser: 1.64 mg/dL — ABNORMAL HIGH (ref 0.61–1.24)
GFR calc Af Amer: 45 mL/min — ABNORMAL LOW (ref >60)
GFR calc non Af Amer: 39 mL/min — ABNORMAL LOW (ref >60)
Glucose, Bld: 129 mg/dL — ABNORMAL HIGH (ref 65–99)
Potassium: 4.4 mmol/L (ref 3.5–5.1)
Sodium: 138 mmol/L (ref 135–145)

## 2018-02-18 LAB — CBG MONITORING, ED
Glucose-Capillary: 69 mg/dL (ref 65–99)
Glucose-Capillary: 79 mg/dL (ref 65–99)
Glucose-Capillary: 113 mg/dL — ABNORMAL HIGH (ref 65–99)

## 2018-02-18 LAB — BRAIN NATRIURETIC PEPTIDE: B Natriuretic Peptide: 33.1 pg/mL (ref 0.0–100.0)

## 2018-02-18 LAB — MAGNESIUM: Magnesium: 1.7 mg/dL (ref 1.7–2.4)

## 2018-02-18 LAB — I-STAT TROPONIN, ED
Troponin i, poc: 0 ng/mL (ref 0.00–0.08)
Troponin i, poc: 0.01 ng/mL (ref 0.00–0.08)

## 2018-02-18 MED ORDER — SODIUM CHLORIDE 0.9 % IV BOLUS (SEPSIS)
500.0000 mL | Freq: Once | INTRAVENOUS | Status: AC
  Administered 2018-02-18: 500 mL via INTRAVENOUS

## 2018-02-18 MED ORDER — LOPERAMIDE HCL 2 MG PO CAPS
4.0000 mg | ORAL_CAPSULE | Freq: Once | ORAL | Status: AC
  Administered 2018-02-18: 4 mg via ORAL
  Filled 2018-02-18: qty 2

## 2018-02-18 MED ORDER — DEXTROSE 50 % IV SOLN
INTRAVENOUS | Status: AC
  Administered 2018-02-18: 25 mL
  Filled 2018-02-18: qty 50

## 2018-02-18 NOTE — ED Triage Notes (Signed)
To ED via [facility] from home, pt had a syncopal episode at home-- states this was the 4th time this has happened-- states "felt bad-dizzy, weak, fell out of chair- did not hit head, then vomited,"  When EMS arrived, pt was dusky in color - hypotensive at 80/50-- 92/p, IV started -- received 500cc NS, Zofran 4mg ,

## 2018-02-18 NOTE — ED Provider Notes (Signed)
I saw and evaluated the patient, reviewed the resident's note and I agree with the findings and plan with the following exceptions.   77 year old male with multiple medical problems presents the emergency department today with a fourth episode of syncope in the last 6 months.  Patient has been admitted a couple times in the past for the same and echocardiogram that was normal about a month ago.  The most recent visit to the emergency room seems to be related to allergic reaction.   He states his been having significant diarrhea for a while secondary to a side effect of his pulmonary medicine.  States that he did not quite pass out but was really dizzy to the point that he fell and could not move because of weakness.  Checked his blood sugar at home is in the 120s and his blood pressure was also 536 systolic initially.  On EMS arrival however his blood pressure was in the 14E systolic.  He was given 500 cc and dextrose prior to arrival here and on my evaluation patient is asymptomatic.  Not experience headache, vision changes, chest pain or palpitations during the event.  No severe abdominal pain or back pain. On exam patient appears slightly dry, vital signs show sinus bradycardia in the 50s and slightly wide pulse pressure. Abdomen benign. Lungs benign.  Although he has risk factors for cardiac syncope, he has been admitted multiple times with normal workups and patient is slightly adverse to being observed again but will if necessary. Plan to check labs, fluids, observe, watch monitor and if feeling better, shared decision making for disposition.    EKG Interpretation  Date/Time:  Sunday February 18 2018 15:21:54 EDT Ventricular Rate:  55 PR Interval:    QRS Duration: 174 QT Interval:  491 QTC Calculation: 470 R Axis:   -68 Text Interpretation:  Sinus rhythm Prolonged PR interval RBBB and LAFB accounting for artifact, no acute changes Confirmed by Merrily Pew [phone]) on 02/18/2018 4:06:26 PM          Mesner, Corene Cornea, MD 02/18/18 2050

## 2018-02-18 NOTE — ED Provider Notes (Signed)
[facility] EMERGENCY DEPARTMENT Provider Note   CSN: [id number] Arrival date & time: 02/18/18  1515     History   Chief Complaint Chief Complaint  Patient presents with  . Loss of Consciousness  . Hypotension    HPI Shane [name] is a 77 y.o. male.   The history is provided by the patient.   Loss of Consciousness    This is a recurrent problem. The current episode started less than 1 hour ago. The problem has been resolved. He lost consciousness for a period of less than one minute. Associated symptoms include light-headedness. Pertinent negatives include abdominal pain, back pain, bladder incontinence, chest pain, confusion, fever, headaches, nausea, palpitations, seizures, slurred speech, vertigo, visual change, vomiting and weakness.  -EMS reports initial blood pressure was 80/40 but improved with 500 cc of normal saline.  Patient was admitted here A month ago for similar symptomswhichShe a month ago for similar symptoms and was determined to have syncope secondary to hypoglycemia.  Fingerstick glucose here was 69.  Past Medical History:  Diagnosis Date  . Cancer ([place])    ling  . Cataract   . Chest pain   . Chronic kidney disease    d/t DM  . Diabetes mellitus    Vgo disposal insulin bolus  simular to insulin pump  . Dyspnea   . GERD (gastroesophageal reflux disease)   . Hyperlipidemia   . Hypertension   . 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 Neuromuscular disorder ([place])   . PONV (postoperative nausea and vomiting)   . Sleep apnea     uses cpap asked to bring mask and tubing    Patient Active Problem List   Diagnosis Date Noted  . Hypoglycemia due to insulin 01/06/2018  . Essential hypertension 01/06/2018  . Diabetes mellitus type 2, with complication, on long term insulin pump ([place]) 01/06/2018  . Syncope and collapse 01/05/2018  . Encounter for therapeutic drug monitoring 06/29/2017  . Obstructive sleep apnea 05/05/2017  . Cancer  ([place])   . 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[place]) 01/05/2017  . Abnormal chest x-ray 10/12/2016  . 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 ANTERIOR FUSION CERVICAL SPINE  2012  . CARDIAC CATHETERIZATION  2011  . CARDIAC CATHETERIZATION N/A 11/09/2016   Procedure: Right Heart Cath;  Surgeon: Belva Crome, MD;  Location: [facility];  Service: Cardiovascular;  Laterality: N/A;  . carpel tunnel     left wrist  . CATARACT EXTRACTION W/ INTRAOCULAR LENS  IMPLANT, BILATERAL  2013  . CERVICAL LAMINECTOMY  2012  . COLONOSCOPY N/A 01/14/2013   Procedure: COLONOSCOPY;  Surgeon: Irene Shipper, MD;  Location: [facility];  Service: Endoscopy;  Laterality: N/A;  . EYE SURGERY    . [place]   left  . LUMBAR LAMINECTOMY  2003  . LUNG BIOPSY Left 12/26/2016   Procedure: LUNG BIOPSY;  Surgeon: [name], MD;  Location: [facility];  Service: Thoracic;  Laterality: Left;  . POSTERIOR FUSION CERVICAL SPINE  2012  . TRIGGER FINGER RELEASE  2011   4th finger left hand  . VIDEO ASSISTED THORACOSCOPY Left 12/26/2016   Procedure: VIDEO ASSISTED THORACOSCOPY;  Surgeon: [name], MD;  Location: [facility];  Service: Thoracic;  Laterality: Left;  [place] VIDEO BRONCHOSCOPY N/A 12/26/2016   Procedure: VIDEO BRONCHOSCOPY;  Surgeon: [name], MD;  Location: [facility];  Service: Thoracic;  Laterality: N/A;       Home Medications    Prior to Admission medications   Medication Sig Start Date End Date Taking? Authorizing Provider  Ascorbic Acid (VITAMIN C) 1000 MG tablet Take 1,000 mg by mouth daily.   Yes [provider]  aspirin 81 MG tablet Take 81 mg by mouth daily.     Yes [provider]  clotrimazole-betamethasone (LOTRISONE) cream APPLY TO AFFECTED AREA TWICE A DAY AS NEEDED FOR IRRITATION 01/16/17  Yes [provider]  diazepam (VALIUM) 5 MG tablet Take 5 mg by mouth at bedtime as needed (for  sleep).    Yes [provider]  EPINEPHrine 0.3 mg/0.3 mL IJ SOAJ injection Inject 0.3 mg into the muscle once. 01/30/18  Yes [provider]  fluticasone (FLONASE) 50 MCG/ACT nasal spray Place 2 sprays into both nostrils daily. 01/19/18  Yes [provider]  furosemide (LASIX) 40 MG tablet Take 40 mg by mouth daily. 02/15/18  Yes [provider]  insulin detemir (LEVEMIR) 100 UNIT/ML injection Inject 22 Units into the skin at bedtime.   Yes [provider]  Insulin Pump Disposable (V-GO 40) KIT 40 Units by Does not apply route daily. humalog 100 units dispensed every 24 hours. Dispose v-go bag after 24 hours of use.    Yes [provider]  KLOR-CON M20 20 MEQ tablet Take 20 mEq by mouth daily.  09/26/16  Yes [provider]  lisinopril (PRINIVIL,ZESTRIL) 20 MG tablet Take 20 mg by mouth daily.  09/08/16  Yes [provider]  Multiple Vitamin (MULTIVITAMIN) tablet Take 1 tablet by mouth daily.     Yes [provider]  Multiple Vitamins-Minerals (MULTIVITAMIN WITH MINERALS) tablet Take 1 tablet by mouth daily.   Yes [provider]  Nintedanib (OFEV) 100 MG CAPS Take 1 capsule (100 mg total) by mouth 2 (two) times daily. 11/09/17  Yes Brand Males, MD  pantoprazole (PROTONIX) 40 MG tablet Take 40 mg by mouth daily.     Yes [provider]  PROAIR HFA 108 (90 Base) MCG/ACT inhaler Inhale 2 puffs into the lungs every 4 (four) hours as needed for wheezing or shortness of breath.  10/03/16  Yes [provider]  promethazine-phenylephrine (PROMETHAZINE VC PLAIN) 6.25-5 MG/5ML SYRP Take 5 mLs by mouth every 4 (four) hours as needed for congestion.   Yes [provider]    Family History Family History  Problem Relation Age of Onset  . Diabetes Mellitus II Mother   . Emphysema Father 28  . Colon cancer Neg Hx   . Esophageal cancer Neg Hx   . Rectal cancer Neg Hx   . Stomach cancer Neg  Hx     Social History Social History   Tobacco Use  . Smoking status: Never Smoker  . Smokeless tobacco: Never Used  Substance Use Topics  . Alcohol use: No  . Drug use: No     Allergies   Codeine and Other   Review of Systems Review of Systems  Constitutional: Positive for fatigue. Negative for activity change, chills and fever.  HENT: Negative for ear pain and sore throat.   Eyes: Negative for pain and visual disturbance.  Respiratory: Negative for cough and shortness of breath.   Cardiovascular: Positive for leg swelling (mild edema ) and syncope. Negative for chest pain and palpitations.  Gastrointestinal: Negative for abdominal pain, nausea and vomiting.  Genitourinary: Negative for bladder incontinence, dysuria and hematuria.  Musculoskeletal: Negative for back pain  and neck pain.  Skin: Negative for rash.  Neurological: Positive for syncope and light-headedness. Negative for vertigo, seizures, weakness, numbness and headaches.  Psychiatric/Behavioral: Negative for confusion.  All other systems reviewed and are negative.    Physical Exam Updated Vital Signs BP 129/60   Pulse (!) 59   Temp (!) 97.3 F (36.3 C) (Oral)   Resp 16   Ht 6' (1.829 m)   Wt 116.1 kg (256 lb)   SpO2 99%   BMI 34.72 kg/m   Physical Exam  Constitutional: He is oriented to person, place, and time. He appears well-developed and well-nourished.  HENT:  Head: Normocephalic and atraumatic.  Mouth/Throat: Oropharynx is clear and moist.  Eyes: Conjunctivae and EOM are normal. Pupils are equal, round, and reactive to light.  Neck: Neck supple.  Cardiovascular: Normal rate and regular rhythm.  No murmur heard. Pulmonary/Chest: Effort normal and breath sounds normal. No respiratory distress. He has no wheezes. He has no rales.  Abdominal: Soft. He exhibits no distension. There is tenderness (mild tenderness LUQ and LLQ only with deep palpation). There is no guarding.  Musculoskeletal: He  exhibits no edema.  Neurological: He is alert and oriented to person, place, and time. He has normal strength. No cranial nerve deficit or sensory deficit. GCS eye subscore is 4. GCS verbal subscore is 5. GCS motor subscore is 6.  Skin: Skin is warm and dry.  Psychiatric: He has a normal mood and affect.  Nursing note and vitals reviewed.    ED Treatments / Results  Labs (all labs ordered are listed, but only abnormal results are displayed) Labs Reviewed  BASIC METABOLIC PANEL - Abnormal; Notable for the following components:      Result Value   Glucose, Bld 129 (*)    BUN 36 (*)    Creatinine, Ser 1.64 (*)    Calcium 8.8 (*)    GFR calc non Af Amer 39 (*)    GFR calc Af Amer 45 (*)    All other components within normal limits  I-STAT CHEM 8, ED - Abnormal; Notable for the following components:   BUN 36 (*)    Creatinine, Ser 1.70 (*)    Glucose, Bld 126 (*)    All other components within normal limits  CBG MONITORING, ED - Abnormal; Notable for the following components:   Glucose-Capillary 113 (*)    All other components within normal limits  CBC  MAGNESIUM  BRAIN NATRIURETIC PEPTIDE  CBG MONITORING, ED  I-STAT TROPONIN, ED  CBG MONITORING, ED  I-STAT TROPONIN, ED    EKG  EKG Interpretation  Date/Time:  Sunday February 18 2018 15:21:54 EDT Ventricular Rate:  55 PR Interval:    QRS Duration: 174 QT Interval:  491 QTC Calculation: 470 R Axis:   -68 Text Interpretation:  Sinus rhythm 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 EXAM: PORTABLE CHEST 1 VIEW COMPARISON:  January 05, 2018 and December 19, 2017 FINDINGS: No pneumothorax. Stable cardiomediastinal silhouette. Coarsening of the interstitium, increased since December 19, 2017 and January 05, 2018 IMPRESSION:  Coarsening of the interstitium, worsened in the interval, could represent edema or atypical infection. Recommend clinical correlation and follow-up to resolution. Electronically Signed   By: Dorise Bullion III M.D   On: 02/18/2018 16:10  Procedures Procedures (including critical care time)  Medications Ordered in ED Medications  dextrose 50 % solution (25 mLs  Given 02/18/18 1535)  sodium chloride 0.9 % bolus 500 mL (0 mLs Intravenous Stopped 02/18/18 1859)  loperamide (IMODIUM) capsule 4 mg (4 mg Oral Given 02/18/18 1857)     Initial Impression / Assessment and Plan / ED Course  I have reviewed the triage vital signs and the nursing notes.  Pertinent labs & imaging results that were available during my care of the patient were reviewed by me and considered in my medical decision making (see chart for details).     Patient is a 77 year old male who presents with syncope.  Patient had similar presentation 2 weeks ago which was found to be hypoglycemic syncope.  Blood sugar on arrival was 69.  Patient states that typically is supposed to be 140 or higher.  Patient given half an amp of D50 here with improvement in blood sugar.  Patient also became more awake and responsive.  He denies any cardiac symptoms such as chest pain, palpitations, shortness of breath.  He does complain of few weeks of fatigue.  Patient had cardiac workup last time he was here that was unrevealing.  He has a normal EF with some diastolic dysfunction.  He is also being treated for idiopathic pulmonary fibrosis.  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  Creatinine is mildly higher than normal but that is not in HPI.  Electrolytes are grossly unchanged.  Finger glucose monitoring remained stable after D50 and some orange juice.  Troponins are negative.  BNP is unremarkable.  Patient safe for discharge home at this time.  He is  able to ambulate in his room without any difficulty or symptoms.  He has maintained his blood glucose.  Patient to follow-up tomorrow with his PCP.  Strict return precautions given.    Final Clinical Impressions(s) / ED Diagnoses   Final diagnoses:  Syncope and collapse  Hypoglycemia    ED Discharge Orders    None      Tobie Poet, DO 02/18/18 2006  Mesner, Corene Cornea, MD 02/18/18 2050

## 2018-02-18 NOTE — ED Notes (Signed)
Insulin pump removed

## 2018-02-18 NOTE — Discharge Instructions (Signed)
Monitor glucose closely and follow-up with your primary care doctor tomorrow to discuss adjustments during her diabetes regimen to help prevent low blood sugar episodes in the future.

## 2018-02-18 NOTE — ED Notes (Signed)
Pt's speech more clear after D50 - 1/2 amp

## 2018-02-18 NOTE — ED Notes (Signed)
Pt had diarrhea stool on bsc-- no dizziness, no nausea, ambulated around room, changed clothes, no dizziness.

## 2018-02-18 NOTE — ED Notes (Signed)
Pt's speech becoming slurred-- CBG 69-- MD at bedside

## 2018-02-19 ENCOUNTER — Telehealth: Payer: Self-pay | Admitting: Internal Medicine

## 2018-02-19 NOTE — Telephone Encounter (Signed)
lmtcb x1 for pt's spouse, [name].

## 2018-02-20 ENCOUNTER — Encounter (HOSPITAL_COMMUNITY): Payer: Self-pay

## 2018-02-20 NOTE — Telephone Encounter (Signed)
Spoke with pt;s wife and advised message from MR. She states she appreciates MR support and will see him on 4/22 to discuss further. Nothing further is needed.

## 2018-02-20 NOTE — Telephone Encounter (Signed)
Spoke with the pt's spouse  She states that the pt has stopped OFEV as of 02/19/18 per Dr Jacquiline Doe request  Ever since starting on med he has had diarrhea, nausea, vomiting, and his vertigo has returned  She states he has had 3 ED visits related to side effects from med  I advised will let MR know and I advised her to be sure he keeps planned ov with MR for 03/26/18  She verbalized understanding

## 2018-02-20 NOTE — Telephone Encounter (Signed)
LEt Shane [name] wife know  1. Sorry to hear that esbriet did not work - why is this not added to allergy list - increased liver enzymes 2. Sorry to hear that ofev is not working - reviewed chart and while some of the issues with ER visit were not ofev overall GI side effects are c/w Ofev and agree with Lora Havens, MD to stop ofev  - add to allergy list  3. Give OV in ILD clinic  First available to discuss future optipns which basically now is research v supportive care  THanks  Dr. Brand Males, M.D., [facility].C.P Pulmonary and Critical Care Medicine Staff Physician, [place] Director - Interstitial Lung Disease  Program  Pulmonary [place] [facility] [place], [place], [address]  Pager: [phone], If no answer or between  15:00h - 7:00h: call [phone] Telephone: [phone]

## 2018-02-22 ENCOUNTER — Encounter (HOSPITAL_COMMUNITY): Payer: Self-pay

## 2018-02-22 ENCOUNTER — Telehealth (HOSPITAL_COMMUNITY): Payer: Self-pay | Admitting: Internal Medicine

## 2018-02-27 ENCOUNTER — Encounter (HOSPITAL_COMMUNITY)
Admission: RE | Admit: 2018-02-27 | Discharge: 2018-02-27 | Disposition: A | Discharge status: Home / Self Care | Payer: Self-pay | Source: Ambulatory Visit | Attending: Internal Medicine | Admitting: Internal Medicine

## 2018-03-01 ENCOUNTER — Encounter (HOSPITAL_COMMUNITY)
Admission: RE | Admit: 2018-03-01 | Discharge: 2018-03-01 | Disposition: A | Discharge status: Home / Self Care | Payer: Self-pay | Source: Ambulatory Visit | Attending: Internal Medicine | Admitting: Internal Medicine

## 2018-03-05 ENCOUNTER — Ambulatory Visit (INDEPENDENT_AMBULATORY_CARE_PROVIDER_SITE_OTHER)
Admission: RE | Admit: 2018-03-05 | Discharge: 2018-03-05 | Disposition: A | Discharge status: Home / Self Care | Payer: Medicare Other | Source: Ambulatory Visit | Attending: Internal Medicine | Admitting: Internal Medicine

## 2018-03-05 DIAGNOSIS — R0602 Shortness of breath: Secondary | ICD-10-CM

## 2018-03-06 ENCOUNTER — Encounter (HOSPITAL_COMMUNITY)
Admission: RE | Admit: 2018-03-06 | Discharge: 2018-03-06 | Disposition: A | Discharge status: Home / Self Care | Payer: Self-pay | Source: Ambulatory Visit | Attending: Internal Medicine | Admitting: Internal Medicine

## 2018-03-06 DIAGNOSIS — Z79899 Other long term (current) drug therapy: Secondary | ICD-10-CM | POA: Insufficient documentation

## 2018-03-06 DIAGNOSIS — E1122 Type 2 diabetes mellitus with diabetic chronic kidney disease: Secondary | ICD-10-CM | POA: Insufficient documentation

## 2018-03-06 DIAGNOSIS — Z794 Long term (current) use of insulin: Secondary | ICD-10-CM | POA: Insufficient documentation

## 2018-03-06 DIAGNOSIS — E785 Hyperlipidemia, unspecified: Secondary | ICD-10-CM | POA: Insufficient documentation

## 2018-03-06 DIAGNOSIS — J84112 Idiopathic pulmonary fibrosis: Secondary | ICD-10-CM | POA: Insufficient documentation

## 2018-03-06 DIAGNOSIS — Z7982 Long term (current) use of aspirin: Secondary | ICD-10-CM | POA: Insufficient documentation

## 2018-03-06 DIAGNOSIS — K219 Gastro-esophageal reflux disease without esophagitis: Secondary | ICD-10-CM | POA: Insufficient documentation

## 2018-03-06 DIAGNOSIS — N189 Chronic kidney disease, unspecified: Secondary | ICD-10-CM | POA: Insufficient documentation

## 2018-03-06 DIAGNOSIS — G4730 Sleep apnea, unspecified: Secondary | ICD-10-CM | POA: Insufficient documentation

## 2018-03-06 DIAGNOSIS — I129 Hypertensive chronic kidney disease with stage 1 through stage 4 chronic kidney disease, or unspecified chronic kidney disease: Secondary | ICD-10-CM | POA: Insufficient documentation

## 2018-03-08 ENCOUNTER — Encounter (HOSPITAL_COMMUNITY)
Admission: RE | Admit: 2018-03-08 | Discharge: 2018-03-08 | Disposition: A | Discharge status: Home / Self Care | Payer: Self-pay | Source: Ambulatory Visit | Attending: Internal Medicine | Admitting: Internal Medicine

## 2018-03-13 ENCOUNTER — Encounter (HOSPITAL_COMMUNITY): Payer: Self-pay

## 2018-03-15 ENCOUNTER — Encounter (HOSPITAL_COMMUNITY): Payer: Self-pay

## 2018-03-15 NOTE — Progress Notes (Signed)
Was able to talk to patient regarding patient's results.  They verbalized an understanding of what was discussed. No further questions at this time. 

## 2018-03-20 ENCOUNTER — Institutional Professional Consult (permissible substitution): Payer: Medicare Other | Admitting: Internal Medicine

## 2018-03-20 ENCOUNTER — Encounter (HOSPITAL_COMMUNITY): Payer: Self-pay

## 2018-03-22 ENCOUNTER — Encounter (HOSPITAL_COMMUNITY): Payer: Self-pay

## 2018-03-26 ENCOUNTER — Ambulatory Visit: Payer: Medicare Other | Admitting: Internal Medicine

## 2018-03-26 ENCOUNTER — Encounter: Payer: Self-pay | Admitting: Internal Medicine

## 2018-03-26 ENCOUNTER — Institutional Professional Consult (permissible substitution): Payer: Medicare Other | Admitting: Internal Medicine

## 2018-03-26 VITALS — BP 128/70 | HR 78 | Ht 72.0 in | Wt 261.0 lb

## 2018-03-26 DIAGNOSIS — Z87898 Personal history of other specified conditions: Secondary | ICD-10-CM | POA: Diagnosis not present

## 2018-03-26 DIAGNOSIS — J84112 Idiopathic pulmonary fibrosis: Secondary | ICD-10-CM | POA: Diagnosis not present

## 2018-03-26 NOTE — Progress Notes (Signed)
Subjective:     Patient ID: Shane [name], male   DOB: Mar 23, 1941, 77 y.o.   MRN: [id number]  HPI    PCP ARONSON,RICHARD A, MD  HPI   IOV 10/12/2016  Chief Complaint  Patient presents with  . Pulomary Consult    Pt referred by Dr. [name] for DOE x months. pt states the SOB has worsened in the last couple weeks. Pt c/o dry cough. pt deneis f/c/s.     77 year old male referred for dyspnea. Reports that he is obese on CPAP on RA for years. 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 He has new onset dyspnea and cough. Details in ACCP ILD questionnaire below. CXR recently personally visualized shows ILD changes. Reports that pneumonia and lasix Rx by PCP have not helped. Review of old CT chest 12/05/2008 shows bibasal ILD without honeuycombing on non HRCT chest. Possibly datin back to 2007 CT abd lung cut. Current findings are worse.   Symptos:  Dyspnea is new x 4-5 months gets dyspneic hurrying on level ground or stops at 100 yerds. Cough x 3 weeks with cough at night and is dry and can be episodic with severe attacks.   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 Did get xposed to insecticide, fertiiler, talc pain, cement, tile    OV  11/23/2016  Chief Complaint  Patient presents with  . Follow-up    Pt here after PFT, HRCT, RHC. Pt state his breathing has slightly improved. Pt c/o increase in dry cough. Pt deneis CP/tightness and f/c/s.    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 He and his wife stated that now he is getting progressively more  short of breath. Although he does tell me that when he tries to work on his car and he tries to push himself he does start feeling better but he feels that he is deconditioned but is unable to condition himself more in order to feel less short of breath. Wife though feels that his dyspnea is more progressive.  In terms of disease etiology:   - He had high resolution CT chest 10/08/2016 and the CT findings are not consistent with UIP. And that is chronicity to this disease with presence even 7 years ago. This mild progression only since then. He had autoimmune profile which is essentially negative.  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 He will only walk 2 laps and stopped because of shortness of breath. But his pulse ox did not drop below 95%. Resting pulse ox was 99%. Peak heart rate was 102. In essence that was clinically insignificant desaturation    In terms of operative risk for surgical lung biopsy   - His right  heart cath shows normal mean pulmonary artery pressure 20 mm with slight elevation pulmonary artery systolic pressure to 34 mmHg and a normal wedge pressure.    - His Myoview is reported normal in November 2017    - He does have sleep apnea and uses CPA   - He does have gait instability issues because of his chronic spine disease but this is stable    OV 01/05/2017  Chief Complaint  Patient presents with  . Follow-up    Pt here after biopsy. Pt states his breathing is unchanged since last seen in 11/2016. Pt c/o dry cough. Pt denies CP/tightness and f/c/s.     llung bx discussion   - here wife wife. 12/26/16 - UIP by [name]. No new diagnosis    OV  02/20/2017  Chief Complaint  Patient presents with  . Follow-up    Pt states his breathing is unchanged since last OV. Pt still has not recieved the Esbriet because of financial issues and unable to locate a foundation to help. Pt c/o dry cough. Pt denies CP/tightness.     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  He is here to follow-up how he was doing on Pirfenidone (Esbriet). However he had delays getting charity support. Helpful foundation ran out of money versus from his income level too high to provide support. He wants to plan of the [facility]. Overall he feels stable in terms of cough and dyspnea. He is interested in pulmonary rehabilitation and he has been in touch with [name] the chair of the local pulmonary fibrosis foundation support group and plans to attend the meeting March 29. There are no other new issues     OV 05/05/2017  Chief Complaint  Patient presents with  . Follow-up    pt states he is at baseline, some sob with exertion.     FU IPF; diagnosed based on biopsy Jan 2018.   Now on esbriet x 2 months. Good tolerance. Only mild fatigue. No GI side efects. Feels better. Seems to think esbriet improves situation; re-educated it does not.  Not using sun screen ; did not remember despite prior education. Wants referral for sleep doc.    OV 06/29/2017  Chief Complaint  Patient presents with  . Follow-up    Pt states his breathing is doing well. Pt states he has an occassional dry cough. Pt denies CP/tightness and f/c/s.     Follow-up idiopathic pulmonary fibrosis  Last visit June 2018 at that time he was on Pirfenidone (Esbriet) for 2 months. After that he had routine liver function tests and started having increased liver enzymes. We at first stopped the statins but this did not improve the liver function test. In fact it got worse. Finally we stopped Pirfenidone (Esbriet) and his liver function tests as of today has normalized.  He did report significant fatigue while on Pirfenidone (Esbriet). Now that he's office. The fatigue is resolved. He is doing pulmonary rehabilitation and this is helping his physical conditioning significantly and he feels good. Overall he feels is IPF is stable. He is here with his wife and they both extremely interested in further antifibiotic therapy particularly Ofev. WE discussed   OV 10/04/2017  Chief Complaint  Patient presents with  . Follow-up    pft done today, no change in breathing, mild coughing/non productive   Follow-up idiopathic pulmonary fibrosis biopsy-proven January 2018. Pirfenidone (Esbriet) failure with abnormal liver function test.Started on Ofev July 2018  He has normal liver function test 09/04/2017. Pulmonary function test today shows continued stability. Walking desaturation test 185 feet 3 laps on room air: Resting heart rate 72/m. Final heart rate 91/m.Resting pulse ox 100%. Final pulse ox 97%. He is here with his wife. They both at test that he is feeling better because of pulmonary rehabilitation. He is also tolerating the Ofev quite well except for mild occasional diarrhea. His main concern is that randomly for the last few monthsbut at night while sitting and postprandial he feels few to several seconds of a catch and shortness of breath but at this time he does not desaturate. Otherwise no problems. He's taking treatment for sleep apnea. He is interested in research protocols.He does feel a catch in his lower back which he thinks is from fibrosis pulling on his chest.    OV 12/25/2017  Chief Complaint  Patient presents with  . Follow-up    Pt states he has been doing okay up until 1 week ago. Pt went to the ER 12/19/17 due to CP and worsening SOB. Also states he is coughing a lot mainly in the afternoons.    Follow-up idiopathic pulmonary fibrosis.  Last visit Halloween 2018.  At that time he was on nintedanib full dose 150 mg twice a day.  After that we took  a call in December 2018 with lot of diarrhea.  Since then he is reduced to 100 mg twice a day for nintedanib.  Most recently December 19, 2017 he ended up in the emergency department with atypical chest pains.  Chart was reviewed.  His troponin and d dimer was normal.  His baseline creatinine which was 1.5 mg percent has actually improved but paradoxically his hemoglobin has dropped although only mildly from 15.2 g% to 12.7 g%.  His d-dimer, BNP and troponin were normal.  He had a chest x-ray general 15 2019 that I personally visualized and shows chronic ILD changes without change.  In terms of his [facility] interstitial lung disease questionnaire shows that his symptoms are slowly declining compared to earlier visit.  He and his wife specifically state that he still continues to have these random catch in his shortness of breath and then he will have 2 things caught and then take a breath again.  He says his oxygen was always stable without any desaturation.  In addition for the last few weeks his cough is worse but there is no sputum there is no fever or chills.  He does not want prednisone for this.  Medication review shows he is on ACE inhibitor.  He is interested in research protocol.  For his air hunger and primary care physician is given him Ativan as needed.  But he tells me that for the cough he takes Hycodan and this works well for him particularly at night.    And also pulmonary function test both FVC and DLCO have declined.  Walking desaturation test on 85 feet x3 laps on room air: He started at 100% at rest.  Finished the third lap at 97%.  Heart rate at rest was 64 and final heart rate was 87.  He did not desaturate to clinical significance but he did drop 3 points with the submaximal exertion.   OV 03/26/2018  Chief Complaint  Patient presents with  . Follow-up    Pt states over the winter he was in the ED 4 times. Pt stopped taking OFEV x1 month ago due to side effects of major diarrhea. Pt  states he has been doing better since OFEV was stopped.   Follow-up idiopathic pulmonary fibrosis biopsy-proven January 2018. Pirfenidone (Esbriet) failure with abnormal liver function test.Started on Ofev July 2018 - stopped in march 2019 due to diarrhea , syncope).   Mr. Keighan Amezcua presents for IPF follow-up.  He is here with his wife.  Since his last visit while on low-dose nintedanib he has had multiple admissions for diarrhea and syncope/presyncope.  He feels all this is related to nintedanib.  His wife believes that significant diarrhea caused dehydration and resulted in syncope/presyncope.  After stopping nintedanib he feels much better.  His quality of life is improved.  Overall dyspnea is stable.  He is not having as much presyncope as before.  Although there is some baseline amount which predated starting anti-fibrotic.  This is also associated with sudden random episodes of him having to catch his breath.  These are unaccounted for.  He has cardiology follow-up coming.  He has never had a Holter test in the past.  At this point in time he has run out of all options for anti-fibrotic therapy for IPF.  Only research trials are available.  He is interested in the [place] study but at this point in time we do not have the band with to recruit.  He did have high-resolution CT chest April 2019 and it is stable compared to end of 2018.  Walking desaturation test on 03/26/2018 185 feet x 3 laps on ROOM AIR:  did not desaturate. Rest pulse ox was 100%, final pulse ox was 96%. HR response 68/min at rest to 90/min at peak exertion. Patient Shane [name]  Did not Desaturate < 88% . Nikki Dom Rovira yes did  Desaturated </= 3% points. Nikki Dom Doshi yes did get tachyardic. HAD BALANCE ISSUES WALKING - BASELINE     K-BILD ILD QUESTIONNAIRE, Symptom score over prior 2 weeks  7-none, 6-rarely, 5-occ, 5-some times, 3-sev times, 2-most times, 1-every time 10/04/2017  12/25/2017   Dyspnea for stairs, incline  or hill 1 1  Chest Tightness 6 3  Worry about seriousness of lung complaint 6 4  Avoided doing things that make you dyspneic 4 3  Have you felt loss of control of lung condition (reversed from original) 5 5  Felt fed up due to lung condition 6 4  Felt urge to breathe aka air hunger 4 4  Has lung condition made you feel anxious 6 5  How often have you experienced wheezing or whistling sound 4 3  How much of the time have you felt your lung dz is getting worse 6 5  How much has your lung condition interfered with job or daily task 2 2  Were you expecting your lung condition to get worse 6 5  How much has your lung function limited you carrying things like groceris 3 2  How much has your lung function made you think of EOL? 6 6  Total    Are you financially worse off 4 4  Grand Total        Results for Shane [name], Shane [name] (MRN [id number]  Ref. Range 10/14/2016 08:19 04/06/2017 10:13 10/04/2017 08:51 12/25/2017   FVC-Pre Latest Units: L 3.00 2.94 2.94 2.88L/61%  FVC-%Pred-Pre Latest Units: % 63 62 62  61%   Results for Shane [name], Shane [name] (MRN [id number]) a  Ref. Range 10/14/2016 08:19 04/06/2017 10:13 10/04/2017 08:51 12/25/2017   DLCO unc Latest Units:  16.82 16.43 15.04 14.56  DLCO unc %  pred Latest Units: % 46 45 41 40%    IMPRESSION: 1. Pulmonary parenchymal pattern of fibrosis is unchanged and per report, is due to idiopathic pulmonary fibrosis. 2. Aortic atherosclerosis (ICD10-170.0). Coronary artery calcification. 3. Enlarged pulmonary arteries, indicative of pulmonary arterial hypertension.   Electronically Signed   By: Lorin Picket M.D.   On: 03/05/2018 15:31    has a past medical history of [facility]), Cataract, Chest pain, Chronic kidney disease, Diabetes mellitus, Dyspnea, GERD (gastroesophageal reflux disease), Hyperlipidemia, Hypertension, Idiopathic pulmonary fibrosis ([place]) (11/2016), Neuromuscular disorder ([place]), PONV (postoperative nausea and vomiting), and Sleep  apnea.   reports that he has never smoked. He has never used smokeless tobacco.  Past Surgical History:  Procedure Laterality Date  . ANTERIOR FUSION CERVICAL SPINE  2012  . CARDIAC CATHETERIZATION  2011  . CARDIAC CATHETERIZATION N/A 11/09/2016   Procedure: Right Heart Cath;  Surgeon: Belva Crome, MD;  Location: [facility];  Service: Cardiovascular;  Laterality: N/A;  . carpel tunnel     left wrist  . CATARACT EXTRACTION W/ INTRAOCULAR LENS  IMPLANT, BILATERAL  2013  . CERVICAL LAMINECTOMY  2012  . COLONOSCOPY N/A 01/14/2013   Procedure: COLONOSCOPY;  Surgeon: Irene Shipper, MD;  Location: [facility];  Service: Endoscopy;  Laterality: N/A;  . EYE SURGERY    . [place]   left  . LUMBAR LAMINECTOMY  2003  . LUNG BIOPSY Left 12/26/2016   Procedure: LUNG BIOPSY;  Surgeon: [name], MD;  Location: [facility];  Service: Thoracic;  Laterality: Left;  . POSTERIOR FUSION CERVICAL SPINE  2012  . TRIGGER FINGER RELEASE  2011   4th finger left hand  . VIDEO ASSISTED THORACOSCOPY Left 12/26/2016   Procedure: VIDEO ASSISTED THORACOSCOPY;  Surgeon: [name], MD;  Location: [facility];  Service: Thoracic;  Laterality: Left;  [place] VIDEO BRONCHOSCOPY N/A 12/26/2016   Procedure: VIDEO BRONCHOSCOPY;  Surgeon: [name], MD;  Location: [place] [facility];  Service: Thoracic;  Laterality: N/A;    Allergies  Allergen Reactions  . Codeine Hives and Itching  . Other Other (See Comments)    Esbriet causes elevated LFTs. D/C on 06/14/17    Immunization History  Administered Date(s) Administered  . Influenza Split 08/14/2017  . Influenza, High Dose Seasonal PF 08/05/2016  . Pneumococcal Conjugate-13 01/14/2015  . Pneumococcal Polysaccharide-23 12/05/2014    Family History  Problem Relation Age of Onset  . Diabetes Mellitus II Mother   . Emphysema Father 73  . Colon cancer Neg Hx   . Esophageal cancer Neg Hx   . Rectal cancer Neg Hx   . Stomach cancer Neg Hx       Current Outpatient Medications:  .  Ascorbic Acid (VITAMIN C) 1000 MG tablet, Take 1,000 mg by mouth daily., Disp: , Rfl:  .  aspirin 81 MG tablet, Take 81 mg by mouth daily.  , Disp: , Rfl:  .  clotrimazole-betamethasone (LOTRISONE) cream, APPLY TO AFFECTED AREA TWICE A DAY AS NEEDED FOR IRRITATION, Disp: , Rfl: 0 .  diazepam (VALIUM) 5 MG tablet, Take 5 mg by mouth at bedtime as needed (for sleep). , Disp: , Rfl:  .  EPINEPHrine 0.3 mg/0.3 mL IJ SOAJ injection, Inject 0.3 mg into the muscle once., Disp: , Rfl: 0 .  fluticasone (FLONASE) 50 MCG/ACT nasal spray, Place 2 sprays into both nostrils daily., Disp: , Rfl: 3 .  furosemide (LASIX) 40 MG tablet, Take 40 mg by mouth  daily., Disp: , Rfl:  .  insulin detemir (LEVEMIR) 100 UNIT/ML injection, Inject 22 Units into the skin at bedtime., Disp: , Rfl:  .  Insulin Pump Disposable (V-GO 40) KIT, 40 Units by Does not apply route daily. humalog 100 units dispensed every 24 hours. Dispose v-go bag after 24 hours of use. , Disp: , Rfl:  .  KLOR-CON M20 20 MEQ tablet, Take 20 mEq by mouth daily. , Disp: , Rfl:  .  lisinopril (PRINIVIL,ZESTRIL) 20 MG tablet, Take 20 mg by mouth daily. , Disp: , Rfl:  .  Multiple Vitamin (MULTIVITAMIN) tablet, Take 1 tablet by mouth daily.  , Disp: , Rfl:  .  Multiple Vitamins-Minerals (MULTIVITAMIN WITH MINERALS) tablet, Take 1 tablet by mouth daily., Disp: , Rfl:  .  pantoprazole (PROTONIX) 40 MG tablet, Take 40 mg by mouth daily.  , Disp: , Rfl:  .  PROAIR HFA 108 (90 Base) MCG/ACT inhaler, Inhale 2 puffs into the lungs every 4 (four) hours as needed for wheezing or shortness of breath. , Disp: , Rfl:  .  promethazine-phenylephrine (PROMETHAZINE VC PLAIN) 6.25-5 MG/5ML SYRP, Take 5 mLs by mouth every 4 (four) hours as needed for congestion., Disp: , Rfl:  .  Nintedanib (OFEV) 100 MG CAPS, Take 1 capsule (100 mg total) by mouth 2 (two) times daily. (Patient not taking: Reported on 03/26/2018), Disp: 60 capsule, Rfl:  3   Review of Systems     Objective:   Physical Exam  Constitutional: He is oriented to person, place, and time. He appears well-developed and well-nourished. No distress.  HENT:  Head: Normocephalic and atraumatic.  Right Ear: External ear normal.  Left Ear: External ear normal.  Mouth/Throat: Oropharynx is clear and moist. No oropharyngeal exudate.  Eyes: Pupils are equal, round, and reactive to light. Conjunctivae and EOM are normal. Right eye exhibits no discharge. Left eye exhibits no discharge. No scleral icterus.  Neck: Normal range of motion. Neck supple. No JVD present. No tracheal deviation present. No thyromegaly present.  Cardiovascular: Normal rate, regular rhythm and intact distal pulses. Exam reveals no gallop and no friction rub.  No murmur heard. Pulmonary/Chest: Effort normal. No respiratory distress. He has no wheezes. He has rales. He exhibits no tenderness.  Abdominal: Soft. Bowel sounds are normal. He exhibits no distension and no mass. There is no tenderness. There is no rebound and no guarding.  Musculoskeletal: Normal range of motion. He exhibits no edema or tenderness.  Lymphadenopathy:    He has no cervical adenopathy.  Neurological: He is alert and oriented to person, place, and time. He has normal reflexes. No cranial nerve deficit. Coordination normal.  Baseline waddling gait  Skin: Skin is warm and dry. No rash noted. He is not diaphoretic. No erythema. No pallor.  Psychiatric: He has a normal mood and affect. His behavior is normal. Judgment and thought content normal.  Nursing note and vitals reviewed.  Vitals:   03/26/18 1401  BP: 128/70  Pulse: 78  SpO2: 96%  Weight: 261 lb (118.4 kg)  Height: 6' (1.829 m)    Estimated body mass index is 35.4 kg/m as calculated from the following:   Height as of this encounter: 6' (1.829 m).   Weight as of this encounter: 261 lb (118.4 kg).     Assessment:       ICD-10-CM   1. IPF (idiopathic pulmonary  fibrosis) ([place]) J84.112   2. History of syncope Z87.898        Plan:  IPF clinically stable  Too bad intolerant to both ofev and esbriet; at this point research and supportive care only optioins  Glad overall better after stopping ofev  Possible syncope was related to ofev related diarrhea/dehydration  However, givne some amount of residual baseline dizziness and catching breath episodes, recommend you ask Dr Stanford Breed if is worthwhile doing a remote holter study  Discussed - GLPG, Galecto and FibroGen studies; will reassess suitability at next visit  [place]  In 2-3 months - do Pre-bd spiro and dlco only. No lung volume or bd response. No post-bd spiro  Return to see me in 2-3 months; will discuss research protocols then (currently no bandwith to recruit next 2 months)    > 50% of this > 25 min visit spent in face to face counseling or coordination of care    Dr. Brand Males, M.D., [facility].C.P Pulmonary and Critical Care Medicine Staff Physician, [place] Director - Interstitial Lung Disease  Program  Pulmonary [place] at [place], [place], [address]  Pager: [phone], If no answer or between  15:00h - 7:00h: call [phone] Telephone: [phone]

## 2018-03-26 NOTE — Patient Instructions (Addendum)
ICD-10-CM   1. 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[place]) J84.112   2. History of syncope Z87.898     IPF clinically stable  Too bad intolerant to both ofev and esbriet; at this point research and supportive care only optioins  Glad overall better after stopping ofev  Possible syncope was related to ofev related diarrhea/dehydration  However, givne some amount of residual baseline dizziness and catching breath episodes, recommend you ask Dr Stanford Breed if is worthwhile doing a remote holter study  Discussed - GLPG, Galecto and FibroGen studies; will reassess suitability at next visit  [place]  In 2-3 months - do Pre-bd spiro and dlco only. No lung volume or bd response. No post-bd spiro  Return to see me in 2-3 months; will discuss research protocols then (currently no bandwith to recruit next 2 months) 0- ILD clinic

## 2018-03-27 ENCOUNTER — Encounter (HOSPITAL_COMMUNITY)
Admission: RE | Admit: 2018-03-27 | Discharge: 2018-03-27 | Disposition: A | Discharge status: Home / Self Care | Payer: Self-pay | Source: Ambulatory Visit | Attending: Internal Medicine | Admitting: Internal Medicine

## 2018-03-29 ENCOUNTER — Encounter (HOSPITAL_COMMUNITY)
Admission: RE | Admit: 2018-03-29 | Discharge: 2018-03-29 | Disposition: A | Discharge status: Home / Self Care | Payer: Self-pay | Source: Ambulatory Visit | Attending: Internal Medicine | Admitting: Internal Medicine

## 2018-04-03 ENCOUNTER — Telehealth: Payer: Self-pay | Admitting: Internal Medicine

## 2018-04-03 NOTE — Telephone Encounter (Signed)
Received a fax from [facility] stating that pt was experiencing some side effects that may require follow up.  Attempted to call pt to discuss this with him but looked at pt's last OV with MR on 4/22 and saw where the OFEV was discontinued due to side effects at that visit.   Both OFEV and Esbriet are on pt's allergy list. Nothing further needed.

## 2018-04-05 ENCOUNTER — Encounter (HOSPITAL_COMMUNITY): Admission: RE | Admit: 2018-04-05 | Payer: Self-pay | Source: Ambulatory Visit

## 2018-04-05 DIAGNOSIS — I129 Hypertensive chronic kidney disease with stage 1 through stage 4 chronic kidney disease, or unspecified chronic kidney disease: Secondary | ICD-10-CM | POA: Insufficient documentation

## 2018-04-05 DIAGNOSIS — K219 Gastro-esophageal reflux disease without esophagitis: Secondary | ICD-10-CM | POA: Insufficient documentation

## 2018-04-05 DIAGNOSIS — J84112 Idiopathic pulmonary fibrosis: Secondary | ICD-10-CM | POA: Insufficient documentation

## 2018-04-05 DIAGNOSIS — E785 Hyperlipidemia, unspecified: Secondary | ICD-10-CM | POA: Insufficient documentation

## 2018-04-05 DIAGNOSIS — G4730 Sleep apnea, unspecified: Secondary | ICD-10-CM | POA: Insufficient documentation

## 2018-04-05 DIAGNOSIS — N189 Chronic kidney disease, unspecified: Secondary | ICD-10-CM | POA: Insufficient documentation

## 2018-04-05 DIAGNOSIS — Z7982 Long term (current) use of aspirin: Secondary | ICD-10-CM | POA: Insufficient documentation

## 2018-04-05 DIAGNOSIS — E1122 Type 2 diabetes mellitus with diabetic chronic kidney disease: Secondary | ICD-10-CM | POA: Insufficient documentation

## 2018-04-05 DIAGNOSIS — Z79899 Other long term (current) drug therapy: Secondary | ICD-10-CM | POA: Insufficient documentation

## 2018-04-05 DIAGNOSIS — Z794 Long term (current) use of insulin: Secondary | ICD-10-CM | POA: Insufficient documentation

## 2018-04-06 ENCOUNTER — Other Ambulatory Visit: Payer: Medicare Other

## 2018-04-10 ENCOUNTER — Encounter (HOSPITAL_COMMUNITY)
Admission: RE | Admit: 2018-04-10 | Discharge: 2018-04-10 | Disposition: A | Discharge status: Home / Self Care | Payer: Self-pay | Source: Ambulatory Visit | Attending: Internal Medicine | Admitting: Internal Medicine

## 2018-04-11 ENCOUNTER — Telehealth: Payer: Self-pay | Admitting: Internal Medicine

## 2018-04-11 NOTE — Telephone Encounter (Signed)
PI OVERSIGHT ATTESTATION  I the Principal Investigator (PI) for the  mentioned study attest that I reviewed the mentioned clinical research coordinator notes on research subject  Shane [name]  born 1941/04/16 . I  agree with the findings mentioned above   Dr. Brand Males, M.D., F.C.C.P., ACRP-CPI Pulmonary and Critical Care Medicine Principal Investigator & Staff Physician [facility] [place] Pager: [phone], If no answer or between  15:00h - 7:00h: call [phone]  04/11/2018 2:59 PM

## 2018-04-11 NOTE — Telephone Encounter (Signed)
The following note is not phone encounter procedures were completed in [facility] office  IPF PRO Registry Purpose: To collect data and biological samples that will support future research studies.  Registry will describe current approaches to diagnosis and treatment of IPF, analyze participant characteristics to describe the natural history of the disease, assess quality of life, describe participants interactions with the health care system, describe IPF treatment practices across multiple institutions, and utilize biological samples linked to well characterized IPF participants to identify disease biomarkers    Clinical Research Coordinator note : This research encounter for Subject Shane [name] with DOB: 04-Nov-1941 on 04/11/2018 for the above research protocol is his 6 month follow up Encounter   and is for purpose of research. The consent for this encounter is under Protocol Version [phone] [id number] and  Is currently IRB approved. The subject expressed continued interest and consent in continuing as a study subject. Subject confirmed that there was  no change in contact information (e.g. address, telephone, email). Subject thanked for participation in research and contribution to science.   In this research encounter 04/11/2018 the research coordinator has abstracted all data from the subjects medical record as outlined by the above stated protocol and collected information will be provided to the sponsor via [facility] system.  The subject came into [facility] research office on [phone] and completed research PRO questionnaires and had blood samples collected as outlined in above protocol. The subject will have his subsequent 6 month interval research follow up visit scheduled at a later date. For additional information regarding subject encounter please refer to subject paper source binder.    Signed by  T. Early Chars BS, [place], [place] 2:07 PM  04/11/2018

## 2018-04-12 ENCOUNTER — Encounter (HOSPITAL_COMMUNITY)
Admission: RE | Admit: 2018-04-12 | Discharge: 2018-04-12 | Disposition: A | Discharge status: Home / Self Care | Payer: Self-pay | Source: Ambulatory Visit | Attending: Internal Medicine | Admitting: Internal Medicine

## 2018-04-17 ENCOUNTER — Encounter (HOSPITAL_COMMUNITY): Payer: Self-pay

## 2018-04-19 ENCOUNTER — Encounter (HOSPITAL_COMMUNITY): Payer: Self-pay

## 2018-04-24 ENCOUNTER — Encounter (HOSPITAL_COMMUNITY)
Admission: RE | Admit: 2018-04-24 | Discharge: 2018-04-24 | Disposition: A | Discharge status: Home / Self Care | Payer: Self-pay | Source: Ambulatory Visit | Attending: Internal Medicine | Admitting: Internal Medicine

## 2018-04-26 ENCOUNTER — Encounter (HOSPITAL_COMMUNITY)
Admission: RE | Admit: 2018-04-26 | Discharge: 2018-04-26 | Disposition: A | Discharge status: Home / Self Care | Payer: Self-pay | Source: Ambulatory Visit | Attending: Internal Medicine | Admitting: Internal Medicine

## 2018-04-26 ENCOUNTER — Ambulatory Visit: Payer: Medicare Other | Admitting: Cardiology

## 2018-04-27 ENCOUNTER — Encounter (HOSPITAL_COMMUNITY): Payer: Self-pay | Admitting: Emergency Medicine

## 2018-04-27 ENCOUNTER — Emergency Department (HOSPITAL_COMMUNITY): Payer: No Typology Code available for payment source

## 2018-04-27 ENCOUNTER — Observation Stay (HOSPITAL_COMMUNITY)
Admission: EM | Admit: 2018-04-27 | Discharge: 2018-04-28 | Disposition: A | Discharge status: Home / Self Care | Payer: No Typology Code available for payment source | Attending: Internal Medicine | Admitting: Internal Medicine

## 2018-04-27 DIAGNOSIS — S161XXA Strain of muscle, fascia and tendon at neck level, initial encounter: Secondary | ICD-10-CM | POA: Insufficient documentation

## 2018-04-27 DIAGNOSIS — R4182 Altered mental status, unspecified: Secondary | ICD-10-CM

## 2018-04-27 DIAGNOSIS — Z794 Long term (current) use of insulin: Secondary | ICD-10-CM | POA: Diagnosis not present

## 2018-04-27 DIAGNOSIS — K219 Gastro-esophageal reflux disease without esophagitis: Secondary | ICD-10-CM | POA: Diagnosis not present

## 2018-04-27 DIAGNOSIS — G4733 Obstructive sleep apnea (adult) (pediatric): Secondary | ICD-10-CM | POA: Insufficient documentation

## 2018-04-27 DIAGNOSIS — N189 Chronic kidney disease, unspecified: Secondary | ICD-10-CM | POA: Diagnosis not present

## 2018-04-27 DIAGNOSIS — I129 Hypertensive chronic kidney disease with stage 1 through stage 4 chronic kidney disease, or unspecified chronic kidney disease: Secondary | ICD-10-CM | POA: Insufficient documentation

## 2018-04-27 DIAGNOSIS — E1122 Type 2 diabetes mellitus with diabetic chronic kidney disease: Secondary | ICD-10-CM | POA: Insufficient documentation

## 2018-04-27 DIAGNOSIS — R55 Syncope and collapse: Secondary | ICD-10-CM | POA: Diagnosis not present

## 2018-04-27 DIAGNOSIS — Z79899 Other long term (current) drug therapy: Secondary | ICD-10-CM | POA: Insufficient documentation

## 2018-04-27 DIAGNOSIS — Z9989 Dependence on other enabling machines and devices: Secondary | ICD-10-CM | POA: Insufficient documentation

## 2018-04-27 DIAGNOSIS — Z7982 Long term (current) use of aspirin: Secondary | ICD-10-CM | POA: Insufficient documentation

## 2018-04-27 LAB — I-STAT CHEM 8, ED
BUN: 23 mg/dL — ABNORMAL HIGH (ref 6–20)
Calcium, Ion: 1.17 mmol/L (ref 1.15–1.40)
Chloride: 106 mmol/L (ref 101–111)
Creatinine, Ser: 1.3 mg/dL — ABNORMAL HIGH (ref 0.61–1.24)
Glucose, Bld: 180 mg/dL — ABNORMAL HIGH (ref 65–99)
HCT: 41 % (ref 39.0–52.0)
Hemoglobin: 13.9 g/dL (ref 13.0–17.0)
Potassium: 3.7 mmol/L (ref 3.5–5.1)
Sodium: 140 mmol/L (ref 135–145)
TCO2: 21 mmol/L — ABNORMAL LOW (ref 22–32)

## 2018-04-27 LAB — URINALYSIS, ROUTINE W REFLEX MICROSCOPIC
Bacteria, UA: NONE SEEN
Bilirubin Urine: NEGATIVE
Glucose, UA: NEGATIVE mg/dL
Hgb urine dipstick: NEGATIVE
Ketones, ur: 5 mg/dL — AB
Leukocytes, UA: NEGATIVE
Nitrite: NEGATIVE
Protein, ur: 30 mg/dL — AB
Specific Gravity, Urine: 1.034 — ABNORMAL HIGH (ref 1.005–1.030)
pH: 5 (ref 5.0–8.0)

## 2018-04-27 LAB — CBC WITH DIFFERENTIAL/PLATELET
Abs Immature Granulocytes: 0.1 10*3/uL (ref 0.0–0.1)
Basophils Absolute: 0 10*3/uL (ref 0.0–0.1)
Basophils Relative: 0 %
Eosinophils Absolute: 0.3 10*3/uL (ref 0.0–0.7)
Eosinophils Relative: 3 %
HCT: 42 % (ref 39.0–52.0)
Hemoglobin: 14.2 g/dL (ref 13.0–17.0)
Immature Granulocytes: 1 %
Lymphocytes Relative: 23 %
Lymphs Abs: 2.1 10*3/uL (ref 0.7–4.0)
MCH: 30.3 pg (ref 26.0–34.0)
MCHC: 33.8 g/dL (ref 30.0–36.0)
MCV: 89.6 fL (ref 78.0–100.0)
Monocytes Absolute: 0.8 10*3/uL (ref 0.1–1.0)
Monocytes Relative: 9 %
Neutro Abs: 5.9 10*3/uL (ref 1.7–7.7)
Neutrophils Relative %: 64 %
Platelets: 245 10*3/uL (ref 150–400)
RBC: 4.69 MIL/uL (ref 4.22–5.81)
RDW: 13.3 % (ref 11.5–15.5)
WBC: 9.1 10*3/uL (ref 4.0–10.5)

## 2018-04-27 LAB — COMPREHENSIVE METABOLIC PANEL
ALT: 20 U/L (ref 17–63)
AST: 26 U/L (ref 15–41)
Albumin: 3.2 g/dL — ABNORMAL LOW (ref 3.5–5.0)
Alkaline Phosphatase: 64 U/L (ref 38–126)
Anion gap: 9 (ref 5–15)
BUN: 22 mg/dL — ABNORMAL HIGH (ref 6–20)
CO2: 22 mmol/L (ref 22–32)
Calcium: 8.8 mg/dL — ABNORMAL LOW (ref 8.9–10.3)
Chloride: 107 mmol/L (ref 101–111)
Creatinine, Ser: 1.53 mg/dL — ABNORMAL HIGH (ref 0.61–1.24)
GFR calc Af Amer: 49 mL/min — ABNORMAL LOW (ref >60)
GFR calc non Af Amer: 42 mL/min — ABNORMAL LOW (ref >60)
Glucose, Bld: 181 mg/dL — ABNORMAL HIGH (ref 65–99)
Potassium: 3.7 mmol/L (ref 3.5–5.1)
Sodium: 138 mmol/L (ref 135–145)
Total Bilirubin: 0.8 mg/dL (ref 0.3–1.2)
Total Protein: 6.8 g/dL (ref 6.5–8.1)

## 2018-04-27 LAB — CBG MONITORING, ED: Glucose-Capillary: 181 mg/dL — ABNORMAL HIGH (ref 65–99)

## 2018-04-27 LAB — POC OCCULT BLOOD, ED: Fecal Occult Bld: NEGATIVE

## 2018-04-27 LAB — I-STAT TROPONIN, ED: Troponin i, poc: 0.09 ng/mL (ref 0.00–0.08)

## 2018-04-27 LAB — AMMONIA: Ammonia: 52 umol/L — ABNORMAL HIGH (ref 9–35)

## 2018-04-27 MED ORDER — SODIUM CHLORIDE 0.9 % IV SOLN
Freq: Once | INTRAVENOUS | Status: AC
  Administered 2018-04-27: 21:00:00 via INTRAVENOUS

## 2018-04-27 MED ORDER — SODIUM CHLORIDE 0.9 % IV BOLUS
1000.0000 mL | Freq: Once | INTRAVENOUS | Status: AC
  Administered 2018-04-27: 1000 mL via INTRAVENOUS

## 2018-04-27 MED ORDER — ONDANSETRON HCL 4 MG/2ML IJ SOLN
4.0000 mg | Freq: Once | INTRAMUSCULAR | Status: AC
  Administered 2018-04-27: 4 mg via INTRAVENOUS
  Filled 2018-04-27: qty 2

## 2018-04-27 MED ORDER — IOHEXOL 300 MG/ML  SOLN
100.0000 mL | Freq: Once | INTRAMUSCULAR | Status: AC | PRN
  Administered 2018-04-27: 100 mL via INTRAVENOUS
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
## 2018-04-27 NOTE — ED Notes (Signed)
Patient transported to CT 

## 2018-04-27 NOTE — ED Notes (Signed)
Urine culture collected and sent to Main Lab with UA.
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
## 2018-05-01 ENCOUNTER — Encounter (HOSPITAL_COMMUNITY): Payer: Self-pay

## 2018-05-02 ENCOUNTER — Ambulatory Visit
Admission: RE | Admit: 2018-05-02 | Discharge: 2018-05-02 | Disposition: A | Discharge status: Home / Self Care | Payer: Medicare Other | Source: Ambulatory Visit | Attending: Internal Medicine | Admitting: Internal Medicine

## 2018-05-02 ENCOUNTER — Other Ambulatory Visit: Payer: Self-pay | Admitting: Internal Medicine

## 2018-05-02 DIAGNOSIS — R55 Syncope and collapse: Secondary | ICD-10-CM

## 2018-05-02 MED ORDER — GADOBENATE DIMEGLUMINE 529 MG/ML IV SOLN
20.0000 mL | Freq: Once | INTRAVENOUS | Status: AC | PRN
  Administered 2018-05-02: 20 mL via INTRAVENOUS

## 2018-05-03 ENCOUNTER — Encounter (HOSPITAL_COMMUNITY): Payer: Self-pay

## 2018-05-08 ENCOUNTER — Encounter (HOSPITAL_COMMUNITY)
Admission: RE | Admit: 2018-05-08 | Discharge: 2018-05-08 | Disposition: A | Discharge status: Home / Self Care | Payer: Self-pay | Source: Ambulatory Visit | Attending: Internal Medicine | Admitting: Internal Medicine

## 2018-05-08 DIAGNOSIS — Z794 Long term (current) use of insulin: Secondary | ICD-10-CM | POA: Insufficient documentation

## 2018-05-08 DIAGNOSIS — J84112 Idiopathic pulmonary fibrosis: Secondary | ICD-10-CM | POA: Insufficient documentation

## 2018-05-08 DIAGNOSIS — I129 Hypertensive chronic kidney disease with stage 1 through stage 4 chronic kidney disease, or unspecified chronic kidney disease: Secondary | ICD-10-CM | POA: Insufficient documentation

## 2018-05-08 DIAGNOSIS — N189 Chronic kidney disease, unspecified: Secondary | ICD-10-CM | POA: Insufficient documentation

## 2018-05-08 DIAGNOSIS — G4730 Sleep apnea, unspecified: Secondary | ICD-10-CM | POA: Insufficient documentation

## 2018-05-08 DIAGNOSIS — Z79899 Other long term (current) drug therapy: Secondary | ICD-10-CM | POA: Insufficient documentation

## 2018-05-08 DIAGNOSIS — K219 Gastro-esophageal reflux disease without esophagitis: Secondary | ICD-10-CM | POA: Insufficient documentation

## 2018-05-08 DIAGNOSIS — E785 Hyperlipidemia, unspecified: Secondary | ICD-10-CM | POA: Insufficient documentation

## 2018-05-08 DIAGNOSIS — E1122 Type 2 diabetes mellitus with diabetic chronic kidney disease: Secondary | ICD-10-CM | POA: Insufficient documentation

## 2018-05-08 DIAGNOSIS — Z7982 Long term (current) use of aspirin: Secondary | ICD-10-CM | POA: Insufficient documentation

## 2018-05-10 ENCOUNTER — Encounter (HOSPITAL_COMMUNITY)
Admission: RE | Admit: 2018-05-10 | Discharge: 2018-05-10 | Disposition: A | Discharge status: Home / Self Care | Payer: Self-pay | Source: Ambulatory Visit | Attending: Internal Medicine | Admitting: Internal Medicine

## 2018-05-15 ENCOUNTER — Encounter (HOSPITAL_COMMUNITY): Payer: Self-pay

## 2018-05-17 ENCOUNTER — Encounter (HOSPITAL_COMMUNITY): Payer: Self-pay

## 2018-05-21 ENCOUNTER — Encounter: Payer: Self-pay | Admitting: Cardiology

## 2018-05-21 ENCOUNTER — Ambulatory Visit (INDEPENDENT_AMBULATORY_CARE_PROVIDER_SITE_OTHER): Payer: Medicare Other | Admitting: Cardiology

## 2018-05-21 VITALS — BP 146/70 | HR 66 | Ht 72.0 in | Wt 262.0 lb

## 2018-05-21 DIAGNOSIS — I10 Essential (primary) hypertension: Secondary | ICD-10-CM | POA: Diagnosis not present

## 2018-05-21 DIAGNOSIS — R55 Syncope and collapse: Secondary | ICD-10-CM

## 2018-05-21 DIAGNOSIS — I2510 Atherosclerotic heart disease of native coronary artery without angina pectoris: Secondary | ICD-10-CM

## 2018-05-21 MED ORDER — ATORVASTATIN CALCIUM 40 MG PO TABS: 40.0000 mg | ORAL_TABLET | Freq: Every day | ORAL | 3 refills | Status: DC
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
## 2018-05-21 NOTE — Patient Instructions (Signed)
Medication Instructions:   START ATORVASTATIN 40 MG ONCE DAILY  Labwork:  Your physician recommends that you return for lab work in: [place]:  Your physician wants you to follow-up in: [place] will receive a reminder letter in the mail two months in advance. If you don't receive a letter, please call our office to schedule the follow-up appointment.   If you need a refill on your cardiac medications before your next appointment, please call your pharmacy.

## 2018-05-22 ENCOUNTER — Encounter (HOSPITAL_COMMUNITY): Payer: Self-pay

## 2018-05-23 ENCOUNTER — Encounter: Payer: Self-pay | Admitting: Internal Medicine

## 2018-05-23 ENCOUNTER — Ambulatory Visit: Payer: Medicare Other | Admitting: Internal Medicine

## 2018-05-23 ENCOUNTER — Ambulatory Visit (INDEPENDENT_AMBULATORY_CARE_PROVIDER_SITE_OTHER): Payer: Medicare Other | Admitting: Internal Medicine

## 2018-05-23 VITALS — BP 128/70 | HR 68 | Ht 73.0 in | Wt 261.0 lb

## 2018-05-23 DIAGNOSIS — J84112 Idiopathic pulmonary fibrosis: Secondary | ICD-10-CM

## 2018-05-23 DIAGNOSIS — Z6834 Body mass index (BMI) 34.0-34.9, adult: Secondary | ICD-10-CM

## 2018-05-23 DIAGNOSIS — Z87898 Personal history of other specified conditions: Secondary | ICD-10-CM | POA: Diagnosis not present

## 2018-05-23 LAB — PULMONARY FUNCTION TEST
DL/VA % pred: 91 %
DL/VA: 4.34 ml/min/mmHg/L
DLCO cor % pred: 47 %
DLCO cor: 17.33 ml/min/mmHg
DLCO unc % pred: 47 %
DLCO unc: 17.13 ml/min/mmHg
FEF 25-75 Pre: 4.09 L/sec
FEF2575-%Pred-Pre: 169 %
FEV1-%Pred-Pre: 77 %
FEV1-Pre: 2.62 L
FEV1FVC-%Pred-Pre: 125 %
FEV6-%Pred-Pre: 65 %
FEV6-Pre: 2.89 L
FEV6FVC-%Pred-Pre: 106 %
FVC-%Pred-Pre: 61 %
FVC-Pre: 2.89 L
Pre FEV1/FVC ratio: 91 %
Pre FEV6/FVC Ratio: 100 %

## 2018-05-23 NOTE — Patient Instructions (Addendum)
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HCC)  - stable disease mild to moderate severity - intolerant to approved anti-fibrotics; esbriet and ofev - research is next option in care  - discussed [place] Phase 3, Fibrogen Phase 3, [place] Phase 2 and [place] Phase 1 studies  - appreciate your interest in all of them. Of these I would says given GI side effects with esbriet/ofev and difficulty doing 6 min walk [place] least preferred   - please take copy of [place] Part A consent and stay tuned to hear from [facility] next few to several weeks  History of syncope  - might be due to sugar fluctuations but refer neurology first available - [facility] or [facility]   Followup  - 8 weeks or sooner if needed - [facility] clinic
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
## 2018-05-23 NOTE — Progress Notes (Signed)
Spirometry and Dlco done today. 

## 2018-05-24 ENCOUNTER — Encounter (HOSPITAL_COMMUNITY): Payer: Self-pay

## 2018-05-28 ENCOUNTER — Ambulatory Visit: Payer: Medicare Other | Admitting: Internal Medicine

## 2018-05-29 ENCOUNTER — Ambulatory Visit: Payer: Medicare Other | Admitting: Internal Medicine

## 2018-05-29 ENCOUNTER — Telehealth: Payer: Self-pay | Admitting: Internal Medicine

## 2018-05-29 ENCOUNTER — Encounter (HOSPITAL_COMMUNITY): Payer: Self-pay

## 2018-05-29 NOTE — Telephone Encounter (Signed)
Spoke with [name]. She stated that the patient has decided to follow up with neurology at [facility] instead of [facility]. I asked her if he needed a referral to be sent to [facility], she stated that she needed to contact patient's PCP to make sure Dr. Jacquiline Doe office has not sent in a referral. Advised her to call us back. She verbalized understanding.   Will await her call phone tomorrow since she stated Dr. [name] is not in the office today.

## 2018-05-30 ENCOUNTER — Ambulatory Visit: Payer: Medicare Other | Admitting: Neurology

## 2018-05-31 ENCOUNTER — Encounter (HOSPITAL_COMMUNITY): Payer: Self-pay

## 2018-05-31 NOTE — Telephone Encounter (Signed)
ATC patient's spouse to follow up - no answer and voicemail is full [facility]

## 2018-06-01 ENCOUNTER — Ambulatory Visit: Payer: Medicare Other | Admitting: Internal Medicine

## 2018-06-01 NOTE — Telephone Encounter (Signed)
Spoke with wife-states she will speak with PCP on Monday as he is not in the office today. Will let us know on Monday more information.

## 2018-06-05 ENCOUNTER — Encounter (HOSPITAL_COMMUNITY): Payer: Self-pay

## 2018-06-05 DIAGNOSIS — J84112 Idiopathic pulmonary fibrosis: Secondary | ICD-10-CM | POA: Insufficient documentation

## 2018-06-05 DIAGNOSIS — E1122 Type 2 diabetes mellitus with diabetic chronic kidney disease: Secondary | ICD-10-CM | POA: Insufficient documentation

## 2018-06-05 DIAGNOSIS — Z794 Long term (current) use of insulin: Secondary | ICD-10-CM | POA: Insufficient documentation

## 2018-06-05 DIAGNOSIS — E785 Hyperlipidemia, unspecified: Secondary | ICD-10-CM | POA: Insufficient documentation

## 2018-06-05 DIAGNOSIS — Z79899 Other long term (current) drug therapy: Secondary | ICD-10-CM | POA: Insufficient documentation

## 2018-06-05 DIAGNOSIS — I129 Hypertensive chronic kidney disease with stage 1 through stage 4 chronic kidney disease, or unspecified chronic kidney disease: Secondary | ICD-10-CM | POA: Insufficient documentation

## 2018-06-05 DIAGNOSIS — G4730 Sleep apnea, unspecified: Secondary | ICD-10-CM | POA: Insufficient documentation

## 2018-06-05 DIAGNOSIS — K219 Gastro-esophageal reflux disease without esophagitis: Secondary | ICD-10-CM | POA: Insufficient documentation

## 2018-06-05 DIAGNOSIS — N189 Chronic kidney disease, unspecified: Secondary | ICD-10-CM | POA: Insufficient documentation

## 2018-06-05 DIAGNOSIS — Z7982 Long term (current) use of aspirin: Secondary | ICD-10-CM | POA: Insufficient documentation

## 2018-06-05 LAB — PULMONARY FUNCTION TEST
DL/VA % pred: 83 %
DL/VA: 3.97 ml/min/mmHg/L
DLCO cor % pred: 42 %
DLCO cor: 15.57 ml/min/mmHg
DLCO unc % pred: 40 %
DLCO unc: 14.56 ml/min/mmHg
FEF 25-75 Pre: 3.15 L/sec
FEF2575-%Pred-Pre: 129 %
FEV1-%Pred-Pre: 72 %
FEV1-Pre: 2.47 L
FEV1FVC-%Pred-Pre: 118 %
FEV6-%Pred-Pre: 65 %
FEV6-Pre: 2.88 L
FEV6FVC-%Pred-Pre: 106 %
FVC-%Pred-Pre: 61 %
FVC-Pre: 2.88 L
Pre FEV1/FVC ratio: 86 %
Pre FEV6/FVC Ratio: 100 %

## 2018-06-05 NOTE — Telephone Encounter (Signed)
Attempted to call pt's spouse [name] but unable to reach her and unable to leave a message due to VM box being full.  Will try to call back later.

## 2018-06-06 NOTE — Telephone Encounter (Signed)
lmtcb x2 for pt's spouse, [name].

## 2018-06-08 NOTE — Telephone Encounter (Signed)
LMTCB x 3 

## 2018-06-11 NOTE — Telephone Encounter (Signed)
lmtcb X4 for pt.  Will close per triage protocol.

## 2018-06-12 ENCOUNTER — Encounter (HOSPITAL_COMMUNITY): Payer: Self-pay

## 2018-06-14 ENCOUNTER — Encounter (HOSPITAL_COMMUNITY): Payer: Self-pay

## 2018-06-19 ENCOUNTER — Encounter (HOSPITAL_COMMUNITY): Payer: Self-pay

## 2018-06-21 ENCOUNTER — Encounter (HOSPITAL_COMMUNITY): Payer: Self-pay

## 2018-06-26 ENCOUNTER — Encounter (HOSPITAL_COMMUNITY): Payer: Self-pay

## 2018-06-28 ENCOUNTER — Encounter (HOSPITAL_COMMUNITY): Payer: Self-pay

## 2018-07-03 ENCOUNTER — Encounter (HOSPITAL_COMMUNITY)
Admission: RE | Admit: 2018-07-03 | Discharge: 2018-07-03 | Disposition: A | Discharge status: Home / Self Care | Payer: Self-pay | Source: Ambulatory Visit | Attending: Internal Medicine | Admitting: Internal Medicine

## 2018-07-05 ENCOUNTER — Encounter (HOSPITAL_COMMUNITY)
Admission: RE | Admit: 2018-07-05 | Discharge: 2018-07-05 | Disposition: A | Discharge status: Home / Self Care | Payer: Self-pay | Source: Ambulatory Visit | Attending: Internal Medicine | Admitting: Internal Medicine

## 2018-07-05 DIAGNOSIS — G4730 Sleep apnea, unspecified: Secondary | ICD-10-CM | POA: Insufficient documentation

## 2018-07-05 DIAGNOSIS — J84112 Idiopathic pulmonary fibrosis: Secondary | ICD-10-CM | POA: Insufficient documentation

## 2018-07-05 DIAGNOSIS — Z794 Long term (current) use of insulin: Secondary | ICD-10-CM | POA: Insufficient documentation

## 2018-07-05 DIAGNOSIS — Z79899 Other long term (current) drug therapy: Secondary | ICD-10-CM | POA: Insufficient documentation

## 2018-07-05 DIAGNOSIS — K219 Gastro-esophageal reflux disease without esophagitis: Secondary | ICD-10-CM | POA: Insufficient documentation

## 2018-07-05 DIAGNOSIS — I129 Hypertensive chronic kidney disease with stage 1 through stage 4 chronic kidney disease, or unspecified chronic kidney disease: Secondary | ICD-10-CM | POA: Insufficient documentation

## 2018-07-05 DIAGNOSIS — E1122 Type 2 diabetes mellitus with diabetic chronic kidney disease: Secondary | ICD-10-CM | POA: Insufficient documentation

## 2018-07-05 DIAGNOSIS — Z7982 Long term (current) use of aspirin: Secondary | ICD-10-CM | POA: Insufficient documentation

## 2018-07-05 DIAGNOSIS — N189 Chronic kidney disease, unspecified: Secondary | ICD-10-CM | POA: Insufficient documentation

## 2018-07-05 DIAGNOSIS — E785 Hyperlipidemia, unspecified: Secondary | ICD-10-CM | POA: Insufficient documentation

## 2018-07-06 ENCOUNTER — Telehealth: Payer: Self-pay | Admitting: Internal Medicine

## 2018-07-06 NOTE — Telephone Encounter (Signed)
Called and spoke with pt's spouse Shane [name] who stated the clinical trial paperwork that was given to pt was misplaced and they are needing a new copy of the paperwork.  MR, please advise what trial you had wanted pt to do so that way pt can be given the correct one. Pt's wife Shane [name] stated she could come by one day on her lunch hour to pick it up and have pt sign it so that way they can return it and have pt begin the trial.

## 2018-07-06 NOTE — Telephone Encounter (Signed)
Jennifer: please print copy of [facility] and [employer] and please either directly inform wife or get it to [place]. He might not qualify for toray as origniallu thought du eto his CT. He might end up qualifying for Fibrogen if our subject 001 ultimatelyqualifies because his CT is similar to subject 001.

## 2018-07-09 NOTE — Telephone Encounter (Signed)
The Toray and [employer] clinical trial handouts have been printed for pt.  Have called [name] to let her know that they have been placed up front so she could come pick up for pt. [name] expressed understanding. Nothing further needed.  They have been placed in an envelope and have been labeled with pt's info on it. [name] stated she would be coming to get it around lunch time today.

## 2018-07-10 ENCOUNTER — Encounter (HOSPITAL_COMMUNITY): Payer: Self-pay

## 2018-07-12 ENCOUNTER — Encounter (HOSPITAL_COMMUNITY): Payer: Self-pay

## 2018-07-17 ENCOUNTER — Encounter (HOSPITAL_COMMUNITY)
Admission: RE | Admit: 2018-07-17 | Discharge: 2018-07-17 | Disposition: A | Discharge status: Home / Self Care | Payer: Self-pay | Source: Ambulatory Visit | Attending: Internal Medicine | Admitting: Internal Medicine

## 2018-07-17 ENCOUNTER — Ambulatory Visit: Payer: Self-pay | Admitting: Internal Medicine

## 2018-07-17 NOTE — Progress Notes (Addendum)
   Interstitial Lung Disease Multidisciplinary Conference   Shane [name]    MRN [id number]    DOB Feb 05, 1941  Primary Care Physician:Aronson, Delfino Lovett, MD  Referring Physician: Brand Males  Date of conference: 07/17/2018  Participating Pulmonary:  Dr. Brand Males, MD ;  Dr Marshell Garfinkel, MD Pathology: Dr Jaquita Folds, MD Radiology: Dr Salvatore Marvel MD Others: pulmonary clnicians  Brief History: ILD +  Serology: negative  MDD discussion of CT scan:   April 2019 CT is progressive. Mild traction bronchiectasis, no honeycombing, Some GGO +, No emphysema. No air trapping.    -> INDETERMINATE HRCT for UIP per 2018 ATS   -> POSSIBLE UIP per 2011 ATS criteria (also reconfirmed in email from Dr Salvatore Marvel on 07/26/2018)  Pathology discussion of CT scan [facility] ready  By Dr Doyle Askew at [place] as [place]. Path done in Jan 2018. 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 :  PFTs: na  Labs: na  MDD Impression/Recs: IPF is clinical dx   Dr. Brand Males, M.D., [facility].C.P Pulmonary and Critical Care Medicine Staff Physician, [place] Director - Interstitial Lung Disease  Program  [facility] [place], [place], [address]  Pager: [phone], If no answer or between  15:00h - 7:00h: call [phone] Telephone: [phone] 07/17/2018 1:19 PM    References: Diagnosis of Idiopathic Pulmonary Fibrosis. An Official ATS/ERS/JRS/ALAT Clinical Practice Guideline. Raghu G et al, [facility]. 2018 Sep 1;198(5):e44-e68.   IPF Suspected   Histopath ology Pattern      UIP  Probable UIP  Indeterminate for  UIP  Alternative  diagnosis    UIP  IPF  IPF  IPF  Non-IPF dx   HRCT   Probabe UIP  IPF  IPF  IPF (Likely)**  Non-IPF dx  Pattern  Indeterminate for UIP  IPF  IPF (Likely)**  Indeterminate  for IPF**  Non-IPF dx    Alternative diagnosis  IPF (Likely)**/  non-IPF dx  Non-IPF dx  Non-IPF dx  Non-IPF dx     Idiopathic pulmonary fibrosis diagnosis based upon HRCT and Biopsy paterns.  ** IPF is the likely diagnosis when any of following features are present:  . Moderate-to-severe traction bronchiectasis/bronchiolectasis (defined as mild traction bronchiectasis/bronchiolectasis in four or more lobes including the lingual as a lobe, or moderate to severe traction bronchiectasis in two or more lobes) in a man over age 61 years or in a woman over age 75 years . Extensive (>30%) reticulation on HRCT and an age >70 years  . Increased neutrophils and/or absence of lymphocytosis in BAL fluid  . Multidisciplinary discussion reaches a confident diagnosis of IPF.   **Indeterminate for IPF  . Without an adequate biopsy is unlikely to be IPF  . With an adequate biopsy may be reclassified to a more specific diagnosis after multidisciplinary discussion and/or additional consultation.   dx = diagnosis; HRCT = high-resolution computed tomography; IPF = idiopathic pulmonary fibrosis; UIP = usual interstitial pneumonia.

## 2018-07-19 ENCOUNTER — Encounter (HOSPITAL_COMMUNITY)
Admission: RE | Admit: 2018-07-19 | Discharge: 2018-07-19 | Disposition: A | Discharge status: Home / Self Care | Payer: Self-pay | Source: Ambulatory Visit | Attending: Internal Medicine | Admitting: Internal Medicine

## 2018-07-23 ENCOUNTER — Ambulatory Visit (INDEPENDENT_AMBULATORY_CARE_PROVIDER_SITE_OTHER): Payer: Medicare Other | Admitting: Internal Medicine

## 2018-07-23 ENCOUNTER — Encounter: Payer: Self-pay | Admitting: Internal Medicine

## 2018-07-23 VITALS — BP 126/64 | HR 69 | Ht 73.0 in | Wt 266.2 lb

## 2018-07-23 DIAGNOSIS — J84112 Idiopathic pulmonary fibrosis: Secondary | ICD-10-CM

## 2018-07-23 NOTE — Patient Instructions (Addendum)
     ICD-10-CM   1. 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 - can make cough worse - start inspiratory muscle training 10-20 times daily - appreciate interest in Toray and [employer]bro[employer] Phase 3 [employer] trial  - I think you will qualify only for FibroGen  - meet Anderson Malta C of research to get a time line for research visit  Followup 3 months to ILD clinic; we might cancel it if in interim you have research visit

## 2018-07-23 NOTE — Progress Notes (Signed)
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 will qualify only for FibroGen  - meet Anderson Malta C of research to get a time line for research visit  Followup 3 months to ILD clinic; we might cancel it if in interim you have research vis    > 50% of this > 25 min visit spent in face to face counseling or coordination of care - by this undersigned MD - Dr Brand Males. This includes one or more of the following documented above: discussion of test results, diagnostic or treatment recommendations, prognosis, risks and benefits of management options, instructions, education, compliance or risk-factor reduction   Dr. Brand Males, M.D., [facility].C.P Pulmonary and Critical Care Medicine Staff Physician, [place] Director - Interstitial Lung Disease  Program  P[facility] [place], [place], [address]  Pager: [phone], If no answer or between  15:00h - 7:00h: call [phone] Telephone: [phone]

## 2018-07-24 ENCOUNTER — Encounter (HOSPITAL_COMMUNITY)
Admission: RE | Admit: 2018-07-24 | Discharge: 2018-07-24 | Disposition: A | Discharge status: Home / Self Care | Payer: Self-pay | Source: Ambulatory Visit | Attending: Internal Medicine | Admitting: Internal Medicine

## 2018-07-24 ENCOUNTER — Ambulatory Visit: Payer: Medicare Other | Admitting: Internal Medicine

## 2018-07-26 ENCOUNTER — Encounter (HOSPITAL_COMMUNITY): Payer: Self-pay

## 2018-07-31 ENCOUNTER — Encounter (HOSPITAL_COMMUNITY): Payer: Self-pay

## 2018-08-02 ENCOUNTER — Encounter: Payer: Self-pay | Admitting: *Deleted

## 2018-08-02 ENCOUNTER — Encounter (HOSPITAL_COMMUNITY)
Admission: RE | Admit: 2018-08-02 | Discharge: 2018-08-02 | Disposition: A | Discharge status: Home / Self Care | Payer: Medicare Other | Source: Ambulatory Visit | Attending: Internal Medicine | Admitting: Internal Medicine
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
## 2018-08-07 ENCOUNTER — Encounter (HOSPITAL_COMMUNITY): Payer: Self-pay

## 2018-08-07 DIAGNOSIS — Z7982 Long term (current) use of aspirin: Secondary | ICD-10-CM | POA: Insufficient documentation

## 2018-08-07 DIAGNOSIS — J84112 Idiopathic pulmonary fibrosis: Secondary | ICD-10-CM | POA: Insufficient documentation

## 2018-08-07 DIAGNOSIS — E1122 Type 2 diabetes mellitus with diabetic chronic kidney disease: Secondary | ICD-10-CM | POA: Insufficient documentation

## 2018-08-07 DIAGNOSIS — Z79899 Other long term (current) drug therapy: Secondary | ICD-10-CM | POA: Insufficient documentation

## 2018-08-07 DIAGNOSIS — I129 Hypertensive chronic kidney disease with stage 1 through stage 4 chronic kidney disease, or unspecified chronic kidney disease: Secondary | ICD-10-CM | POA: Insufficient documentation

## 2018-08-07 DIAGNOSIS — K219 Gastro-esophageal reflux disease without esophagitis: Secondary | ICD-10-CM | POA: Insufficient documentation

## 2018-08-07 DIAGNOSIS — N189 Chronic kidney disease, unspecified: Secondary | ICD-10-CM | POA: Insufficient documentation

## 2018-08-07 DIAGNOSIS — G4730 Sleep apnea, unspecified: Secondary | ICD-10-CM | POA: Insufficient documentation

## 2018-08-07 DIAGNOSIS — Z794 Long term (current) use of insulin: Secondary | ICD-10-CM | POA: Insufficient documentation

## 2018-08-07 DIAGNOSIS — E785 Hyperlipidemia, unspecified: Secondary | ICD-10-CM | POA: Insufficient documentation

## 2018-08-09 ENCOUNTER — Encounter (HOSPITAL_COMMUNITY)
Admission: RE | Admit: 2018-08-09 | Discharge: 2018-08-09 | Disposition: A | Discharge status: Home / Self Care | Payer: Self-pay | Source: Ambulatory Visit | Attending: Internal Medicine | Admitting: Internal Medicine

## 2018-08-14 ENCOUNTER — Encounter (HOSPITAL_COMMUNITY): Payer: Self-pay

## 2018-08-16 ENCOUNTER — Encounter (HOSPITAL_COMMUNITY): Payer: Self-pay

## 2018-08-17 ENCOUNTER — Telehealth: Payer: Self-pay | Admitting: Internal Medicine

## 2018-08-17 NOTE — Telephone Encounter (Signed)
I placed a call to Shane [name] and spoke with his wife regarding his referral to [facility] for Pre- screening for potential trial participation for the [facility] study. The purpose of the call was to inform in that we are anticipating having him come in some time within the next month, (end of September to First/Middle of October). I Provided his wife with brief detail on the study and mentioned that in the near future [facility] would be contacting again to provide more detailed description on the study. During the call the subjects wife was able to confirm that the patient has no history of any malignancy and wanted to make [place] aware that [name] still goes to physical therapy every Tuesday and Thursday from 9am-11am and asked that any potential scheduling be planned outside of those windows if possible. Greater than 67mins was spent discussing the above referenced topics with the patients' spouse. She was thanked for her time and informed that a member of the study team would be in contact in the next few weeks.

## 2018-08-17 NOTE — Telephone Encounter (Signed)
We will work on ensuring that his visits are scheduled in a way that they do not interfere with PT . Not sure is possible but will see    SIGNATURE    Dr. Brand Males, M.D., F.C.C.P, ACRP-CPI Pulmonary and [place] Investigator, [facility] [place] Staff Physician, [place] Director - Interstitial Lung Disease  Program  Pulmonary [facility] [place], [place], [address]  Pager: [phone], If no answer or between  15:00h - 7:00h: call [phone] Telephone: [phone]  4:43 PM 08/17/2018

## 2018-08-21 ENCOUNTER — Encounter (HOSPITAL_COMMUNITY)
Admission: RE | Admit: 2018-08-21 | Discharge: 2018-08-21 | Disposition: A | Discharge status: Home / Self Care | Payer: Self-pay | Source: Ambulatory Visit | Attending: Internal Medicine | Admitting: Internal Medicine

## 2018-08-23 ENCOUNTER — Encounter (HOSPITAL_COMMUNITY): Payer: Self-pay

## 2018-08-28 ENCOUNTER — Encounter (HOSPITAL_COMMUNITY)
Admission: RE | Admit: 2018-08-28 | Discharge: 2018-08-28 | Disposition: A | Discharge status: Home / Self Care | Payer: Self-pay | Source: Ambulatory Visit | Attending: Internal Medicine | Admitting: Internal Medicine

## 2018-08-30 ENCOUNTER — Encounter (HOSPITAL_COMMUNITY): Payer: Self-pay

## 2018-09-04 ENCOUNTER — Encounter (HOSPITAL_COMMUNITY)
Admission: RE | Admit: 2018-09-04 | Discharge: 2018-09-04 | Disposition: A | Discharge status: Home / Self Care | Payer: Self-pay | Source: Ambulatory Visit | Attending: Internal Medicine | Admitting: Internal Medicine

## 2018-09-04 DIAGNOSIS — Z794 Long term (current) use of insulin: Secondary | ICD-10-CM | POA: Insufficient documentation

## 2018-09-04 DIAGNOSIS — N189 Chronic kidney disease, unspecified: Secondary | ICD-10-CM | POA: Insufficient documentation

## 2018-09-04 DIAGNOSIS — G4730 Sleep apnea, unspecified: Secondary | ICD-10-CM | POA: Insufficient documentation

## 2018-09-04 DIAGNOSIS — Z7982 Long term (current) use of aspirin: Secondary | ICD-10-CM | POA: Insufficient documentation

## 2018-09-04 DIAGNOSIS — E1122 Type 2 diabetes mellitus with diabetic chronic kidney disease: Secondary | ICD-10-CM | POA: Insufficient documentation

## 2018-09-04 DIAGNOSIS — Z79899 Other long term (current) drug therapy: Secondary | ICD-10-CM | POA: Insufficient documentation

## 2018-09-04 DIAGNOSIS — K219 Gastro-esophageal reflux disease without esophagitis: Secondary | ICD-10-CM | POA: Insufficient documentation

## 2018-09-04 DIAGNOSIS — E785 Hyperlipidemia, unspecified: Secondary | ICD-10-CM | POA: Insufficient documentation

## 2018-09-04 DIAGNOSIS — I129 Hypertensive chronic kidney disease with stage 1 through stage 4 chronic kidney disease, or unspecified chronic kidney disease: Secondary | ICD-10-CM | POA: Insufficient documentation

## 2018-09-04 DIAGNOSIS — J84112 Idiopathic pulmonary fibrosis: Secondary | ICD-10-CM | POA: Insufficient documentation

## 2018-09-06 ENCOUNTER — Encounter (HOSPITAL_COMMUNITY)
Admission: RE | Admit: 2018-09-06 | Discharge: 2018-09-06 | Disposition: A | Discharge status: Home / Self Care | Payer: Self-pay | Source: Ambulatory Visit | Attending: Internal Medicine | Admitting: Internal Medicine

## 2018-09-11 ENCOUNTER — Encounter (HOSPITAL_COMMUNITY)
Admission: RE | Admit: 2018-09-11 | Discharge: 2018-09-11 | Disposition: A | Discharge status: Home / Self Care | Payer: Self-pay | Source: Ambulatory Visit | Attending: Internal Medicine | Admitting: Internal Medicine

## 2018-09-13 ENCOUNTER — Encounter (HOSPITAL_COMMUNITY)
Admission: RE | Admit: 2018-09-13 | Discharge: 2018-09-13 | Disposition: A | Discharge status: Home / Self Care | Payer: Self-pay | Source: Ambulatory Visit | Attending: Internal Medicine | Admitting: Internal Medicine

## 2018-09-18 ENCOUNTER — Encounter (HOSPITAL_COMMUNITY): Payer: Self-pay

## 2018-09-19 ENCOUNTER — Encounter: Payer: Medicare Other | Admitting: Internal Medicine

## 2018-09-20 ENCOUNTER — Other Ambulatory Visit: Payer: Self-pay | Admitting: Internal Medicine

## 2018-09-20 ENCOUNTER — Encounter (HOSPITAL_COMMUNITY)
Admission: RE | Admit: 2018-09-20 | Discharge: 2018-09-20 | Disposition: A | Discharge status: Home / Self Care | Payer: Medicare Other | Source: Ambulatory Visit | Attending: Internal Medicine | Admitting: Internal Medicine

## 2018-09-20 DIAGNOSIS — J84112 Idiopathic pulmonary fibrosis: Secondary | ICD-10-CM

## 2018-09-20 DIAGNOSIS — Z006 Encounter for examination for normal comparison and control in clinical research program: Secondary | ICD-10-CM

## 2018-09-21 ENCOUNTER — Encounter: Payer: Medicare Other | Admitting: *Deleted

## 2018-09-21 ENCOUNTER — Ambulatory Visit: Payer: Medicare Other | Admitting: Internal Medicine

## 2018-09-21 ENCOUNTER — Encounter (INDEPENDENT_AMBULATORY_CARE_PROVIDER_SITE_OTHER): Payer: Medicare Other | Admitting: Internal Medicine

## 2018-09-21 DIAGNOSIS — Z006 Encounter for examination for normal comparison and control in clinical research program: Secondary | ICD-10-CM

## 2018-09-21 DIAGNOSIS — J84112 Idiopathic pulmonary fibrosis: Secondary | ICD-10-CM

## 2018-09-21 LAB — PULMONARY FUNCTION TEST
DL/VA % pred: 83 %
DL/VA: 3.92 ml/min/mmHg/L
DLCO cor % pred: 43 %
DLCO cor: 15.26 ml/min/mmHg
DLCO unc % pred: 42 %
DLCO unc: 14.95 ml/min/mmHg

## 2018-09-21 NOTE — Research (Signed)
Title: FGCL-3019-091 (FibroGen Study) is a Phase 3, randomized, double-blind, placebo-controlled multicenter international study to evaluate evaluate the efficacy and safety of 30 mg/kg IV infusions of pamrevlumab administered every 3 weeks for 52 weeks as compared to placebo in subjects with Idiopathic Pulmonary Fibrosis. Primary end point is: change in FVC from baseline at week 52  Protocol #: FGCL-3019-091, Clinical Trials #: [id number] Sponsor: [email or web address]  ([place], [place], [place])  Protocol Version for 09/21/2018 date of 17MAR2019 Amend 1.0  Consent Version for 09/21/2018 date of 12AUG2019 V2.1  Key Features of Pamrevlumab (FG-3019) the study drug: a recombinant fully human IgG kappa monoclonal antibody that binds to CTGF and is being developed for treatment of diseases in which tissue fibrosis has a major pathogenic role. In particular, pamrevlumab appears to disrupt a CTGF autocrine loop in mesenchymal cells like myofibroblasts that reduces their recruitment of leukocytes like macrophages, mast and dendritic cells via chemokine secretion. This disruption results in collapse of the cellular crosstalk that drives tissue remodeling.  Key Inclusion Criteria:  Age 16 to 54 years Diagnosis of IPF within the past 5 years  Interstitial pulmonary fibrosis defined by HRCT scan at Screening, with evidence of ?10% to <50% parenchymal fibrosis and <25% honeycombing, within the whole lung. Not currently receiving treatment for IPF with approved therapy. a. FVC value ?50% and ?90% of predicted at screening b. DLCO percent of predicted and corrected by Hb value ?30% and ?90% at screening The extent of fibrosis is greater than the extent of emphysema on HRCT. Male subjects with partners of childbearing potential and male subjects of childbearing potential (including those <1 year postmenopausal) must use double barrier contraception methods during conduct of study, and for 3 months after last dose  of study drug.   Key Exclusion Criteria  Ongoing acute IPF exacerbation, or suspicion of such process, during Screening or Randomization Interstitial lung disease other than IPF; Poorly controlled chronic heart failure; clinical diagnosis of cor pulmonale requiring specific treatment; or severe pulmonary arterial hypertension Smoking within 3 months of Screening and/or unwilling to avoid smoking throughout the study. Use of any investigational drugs, for IPF or not, in the 30 days prior to screening initiation. Or use of approved IPF therapies within 5 half-lives of screening. High likelihood of lung transplantation within 6 months after Day 1. Any history of malignancy likely to result in significant disability or mortality likely to require significant medical or surgical intervention within the next 2 years. This does not include minor surgical procedures for localized cancer. Previous exposure to pamrevlumab. Daily use of PDE-5 inhibitor drugs [e.g. sildenafil, tadalafil, other]. (Note: Intermittent use of one type for erectile dysfunction or severe pulmonary hypertension is allowed).    Mechanism of Action: Pamrevlumab is a human recombinant IgG monoclonal antibody that binds to connective tissue growth factor (CTGF).  CTGF plays a role by mediating the process of fibrosis. By binding to CTGF, pamrevlumab blocks its biologic activity; thereby preventing cell proliferation, adhesion and migration of growth factors involved in fibrotic changes. It is also being studied in other conditions where fibrotic changes play a role; liver fibrosis, Duchenne muscular dystrophy and certain cancers.   Half-life: 58-141 hours; t1/2 increases with increasing doses   Interactions No known drug interactions. All concomitant medications to be reviewed by PI.     Safety Data Amendment 1.0. 18 February 2018 624 subjects have been exposed to pamrevlumab, 270 with IPF The most common TEAEs in all subjects  with IPF: Cough, fatigue, dyspnea, upper respiratory  tract infection, bronchitis, nasopharyngitis No known effect on qtc prolongation, renal or hepatic issues   Phase 1 study Study [id number]-MC3019-002, n=21 Enrolled 21 subjects with IPF No dose-limiting toxicities All adverse events were considered mild to moderate 76% of subjects experienced at least 1 TEAE The most common TEAEs: Pyrexia (n=3, 14% of subjects) Cough (n=3, 14% of subjects) Dyspnea (n=3, 14% of subjects) Respiratory tract infection (n=2, 9% of subjects)   Phase 2 study Study [id number]-3019-049, n=90 Enrolled 90 subjects with IPF 14 deaths occurred, 13 were deemed to be related to IPF 20% of subjects experienced a TEAE that led to study drug discontinuation IPF and respiratory failure were the two most common reasons for discontinuation; occurring in 8% and 3% of patients, respectively The most common TEAEs: Cough (n=34, 38% of subjects) Dyspnea (n=24, 27% of subjects) Fatigue (n=24, 27% of subjects) Nasopharyngitis (n=20, 22% of subjects) Respiratory tract infection (n=19, 21% of subjects) Bronchitis (n=18, 20% of subjects)   Phase 2 study Study [id number]-3019-067, n=103 103 subjects enrolled with IPF 9 deaths occurred; 4 deemed related to IPF, 5 related to other respiratory causes 18% of subjects experienced a TEAE that led to study drug discontinuation The most common TEAEs: Cough (n=48, 47% of subjects) Respiratory tract infection (n=39, 38% of subjects) IPF (n=32, 31% of subjects) Dysnpea (n=30, 29% of subjects) Sinusitis (n=21, 21% of subjects) Fatigue (n=20, 19% of subjects)   Overall, pamrevlumab has been well tolerated. Infusion-related reactions have been reported at a rate that is consistent with other human monoclonal antibodies. Based on the mechanism of action of pamrevlumab, by inhibiting CTGF, there was some concern that this would cause impaired wound healing or impaired bone fracture healing. However, there  were no serious adverse events reported in any study relating to these two issues.    Clinical Research Coordinator  note : This visit for Subject Shane [name] with DOB: 1941-08-24 on 09/21/2018 for the above protocol is Visit/Encounter screening  and is for purpose of research . The consent for this encounter is currently IRB approved. The subject expressed continued interest and consent in starting  as a study subject. Subject confirmed that there was no change in contact information (e.g. address, telephone, email). Subject thanked for participation in research and contribution to science.   In this visit 09/21/2018 the subject will be evaluated by investigator named Dr. Chase Caller. This research coordinator has verified that the investigator is up to date with his training. The subject presented to clinic today with his Wife to consent to the above referenced protocol. The subject was made aware by the research coordinator Early Chars, that this is an investigational study and that his participation was completed voluntary. The subject was given ample time to review the ICF document and all questions were answered to the subjects satisfaction. After obtaining subjects singed consent, all screening assessments performed today were conducted as per the above referenced protocol. For further information regarding today's subject screening visit please refer to subjects paper source binder.   Signed by  Rosaland Lao BS, [place], [place] 3:51 PM 09/21/2018

## 2018-09-21 NOTE — Patient Instructions (Signed)
ICD-10-CM   1. Research study patient Z00.6   2. 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

## 2018-09-21 NOTE — Progress Notes (Signed)
Title: FGCL-3019-091 (FibroGen Study) is a Phase 3, randomized, double-blind, placebo-controlled multicenter international study to evaluate evaluate the efficacy and safety of 30 mg/kg IV infusions of pamrevlumab administered every 3 weeks for 52 weeks as compared to placebo in subjects with Idiopathic Pulmonary Fibrosis. Primary end point is: change in FVC from baseline at week 52  Protocol #: FGCL-3019-091, Clinical Trials #: [id number] Sponsor: [email or web address]  ([place], [place], [place])   Protocol Version: Amendment 1.0 dated 18 February 2018 IB v17 dated 28-Sep-2017 Informed Consent Form, Version 2.1, dated 07/16/18 Local Site Version 1.0 dated 06 Apr 2018  Key Features of Pamrevlumab (FG-3019) the study drug: a recombinant fully human IgG kappa monoclonal antibody that binds to CTGF and is being developed for treatment of diseases in which tissue fibrosis has a major pathogenic role. In particular, pamrevlumab appears to disrupt a CTGF autocrine loop in mesenchymal cells like myofibroblasts that reduces their recruitment of leukocytes like macrophages, mast and dendritic cells via chemokine secretion. This disruption results in collapse of the cellular crosstalk that drives tissue remodeling.  Key Inclusion Criteria:  Age 63 to 66 years Diagnosis of IPF within the past 5 years  Interstitial pulmonary fibrosis defined by HRCT scan at Screening, with evidence of ?10% to <50% parenchymal fibrosis and <25% honeycombing, within the whole lung. Not currently receiving treatment for IPF with approved therapy. a. FVC value ?50% and ?90% of predicted at screening b. DLCO percent of predicted and corrected by Hb value ?30% and ?90% at screening The extent of fibrosis is greater than the extent of emphysema on HRCT. Male subjects with partners of childbearing potential and male subjects of childbearing potential (including those <1 year postmenopausal) must use double barrier contraception methods  during conduct of study, and for 3 months after last dose of study drug.   Key Exclusion Criteria  Ongoing acute IPF exacerbation, or suspicion of such process, during Screening or Randomization Interstitial lung disease other than IPF; Poorly controlled chronic heart failure; clinical diagnosis of cor pulmonale requiring specific treatment; or severe pulmonary arterial hypertension Smoking within 3 months of Screening and/or unwilling to avoid smoking throughout the study. Use of any investigational drugs, for IPF or not, in the 30 days prior to screening initiation. Or use of approved IPF therapies within 5 half-lives of screening. High likelihood of lung transplantation within 6 months after Day 1. Any history of malignancy likely to result in significant disability or mortality likely to require significant medical or surgical intervention within the next 2 years. This does not include minor surgical procedures for localized cancer. Previous exposure to pamrevlumab. Daily use of PDE-5 inhibitor drugs [e.g. sildenafil, tadalafil, other]. (Note: Intermittent use of one type for erectile dysfunction or severe pulmonary hypertension is allowed).    Mechanism of Action: Pamrevlumab is a human recombinant IgG monoclonal antibody that binds to connective tissue growth factor (CTGF).  CTGF plays a role by mediating the process of fibrosis. By binding to CTGF, pamrevlumab blocks its biologic activity; thereby preventing cell proliferation, adhesion and migration of growth factors involved in fibrotic changes. It is also being studied in other conditions where fibrotic changes play a role; liver fibrosis, Duchenne muscular dystrophy and certain cancers.   Half-life: 58-141 hours; t1/2 increases with increasing doses   Interactions No known drug interactions. All concomitant medications to be reviewed by PI.     Safety Data Amendment 1.0. 18 February 2018 624 subjects have been exposed to  pamrevlumab, 270 with IPF The most common TEAEs  in all subjects with IPF: Cough, fatigue, dyspnea, upper respiratory tract infection, bronchitis, nasopharyngitis No known effect on qtc prolongation, renal or hepatic issues   Phase 1 study Study [id number]-MC3019-002, n=21 Enrolled 21 subjects with IPF No dose-limiting toxicities All adverse events were considered mild to moderate 76% of subjects experienced at least 1 TEAE The most common TEAEs: Pyrexia (n=3, 14% of subjects) Cough (n=3, 14% of subjects) Dyspnea (n=3, 14% of subjects) Respiratory tract infection (n=2, 9% of subjects)   Phase 2 study Study FGCL-3019-049, n=90 Enrolled 90 subjects with IPF 14 deaths occurred, 13 were deemed to be related to IPF 20% of subjects experienced a TEAE that led to study drug discontinuation IPF and respiratory failure were the two most common reasons for discontinuation; occurring in 8% and 3% of patients, respectively The most common TEAEs: Cough (n=34, 38% of subjects) Dyspnea (n=24, 27% of subjects) Fatigue (n=24, 27% of subjects) Nasopharyngitis (n=20, 22% of subjects) Respiratory tract infection (n=19, 21% of subjects) Bronchitis (n=18, 20% of subjects)   Phase 2 study Study FGCL-3019-067, n=103 103 subjects enrolled with IPF 9 deaths occurred; 4 deemed related to IPF, 5 related to other respiratory causes 18% of subjects experienced a TEAE that led to study drug discontinuation The most common TEAEs: Cough (n=48, 47% of subjects) Respiratory tract infection (n=39, 38% of subjects) IPF (n=32, 31% of subjects) Dysnpea (n=30, 29% of subjects) Sinusitis (n=21, 21% of subjects) Fatigue (n=20, 19% of subjects)   Overall, pamrevlumab has been well tolerated. Infusion-related reactions have been reported at a rate that is consistent with other human monoclonal antibodies. Based on the mechanism of action of pamrevlumab, by inhibiting CTGF, there was some concern that this would cause impaired  wound healing or impaired bone fracture healing. However, there were no serious adverse events reported in any study relating to these two issues.   ........................................................... This visit for Subject Shane [name] with DOB: March 11, 1941 on 09/21/2018 for the above protocol is Visit/Encounter #  Screening/consent  and is for purpose of reserch . Subject/LAR expressed continued interest and consent in starting as a study subject. Subject thanked for participation in research and contribution to science.  In the weeks leading up to this visit patient and his wife were given a copy of the consent form and the study was discussed.  He is intolerant to both nintedanib and pirfenidone.  He does not have any active anti-fibrotic therapeutic options.  At this point in time he is interested in research trials. The above study was discussed. ICF was signed first before any study related procedures. Later -past 11am did physical exam. He is undergoing rest of study procedures per protocol    SIGNATURE    Dr. Brand Males, M.D., F.C.C.P, ACRP-CPI Pulmonary and Critical Care Medicine Research Investigator, [facility] [place] Staff Physician, [place] Director - Interstitial Lung Disease  Program  Pulmonary [facility] and [facility] @ [place], [place], [address]  Pager: [phone], If no answer or between  15:00h - 7:00h: call [phone] Telephone: [phone]  11:55 AM 09/21/2018

## 2018-09-25 ENCOUNTER — Encounter (HOSPITAL_COMMUNITY)
Admission: RE | Admit: 2018-09-25 | Discharge: 2018-09-25 | Disposition: A | Discharge status: Home / Self Care | Payer: Self-pay | Source: Ambulatory Visit | Attending: Internal Medicine | Admitting: Internal Medicine

## 2018-09-25 ENCOUNTER — Other Ambulatory Visit: Payer: Self-pay | Admitting: Internal Medicine

## 2018-09-25 ENCOUNTER — Ambulatory Visit: Payer: Medicare Other | Admitting: Internal Medicine

## 2018-09-25 DIAGNOSIS — Z006 Encounter for examination for normal comparison and control in clinical research program: Secondary | ICD-10-CM

## 2018-09-25 DIAGNOSIS — J84112 Idiopathic pulmonary fibrosis: Secondary | ICD-10-CM

## 2018-09-27 ENCOUNTER — Ambulatory Visit: Payer: Medicare Other | Attending: Internal Medicine | Admitting: Physical Therapy

## 2018-09-27 ENCOUNTER — Other Ambulatory Visit: Payer: Self-pay

## 2018-09-27 ENCOUNTER — Encounter (HOSPITAL_COMMUNITY)
Admission: RE | Admit: 2018-09-27 | Discharge: 2018-09-27 | Disposition: A | Discharge status: Home / Self Care | Payer: Self-pay | Source: Ambulatory Visit | Attending: Internal Medicine | Admitting: Internal Medicine

## 2018-09-27 DIAGNOSIS — R2689 Other abnormalities of gait and mobility: Secondary | ICD-10-CM | POA: Diagnosis present

## 2018-09-27 DIAGNOSIS — R2681 Unsteadiness on feet: Secondary | ICD-10-CM | POA: Insufficient documentation

## 2018-09-27 DIAGNOSIS — M6281 Muscle weakness (generalized): Secondary | ICD-10-CM | POA: Diagnosis present

## 2018-09-27 DIAGNOSIS — Z9181 History of falling: Secondary | ICD-10-CM | POA: Diagnosis present

## 2018-09-29 ENCOUNTER — Encounter: Payer: Self-pay | Admitting: Physical Therapy

## 2018-09-29 NOTE — Therapy (Signed)
[facility] [address] [place], [place], [address] Phone: [phone]   Fax:  [phone]  Physical Therapy Evaluation  Patient Details  Name: Shane [name] MRN: [id number] Date of Birth: 02-May-1941 Referring Provider (PT): Burnard Bunting, MD   Encounter Date: 09/27/2018  PT End of Session - 09/29/18 1644    Visit Number  1    Number of Visits  17    Date for PT Re-Evaluation  11/26/18    Authorization Type  UHC Medicare    Authorization Time Period  09/27/18 to 12/26/2018    PT Start Time  1450    PT Stop Time  1535    PT Time Calculation (min)  45 min    Equipment Utilized During Treatment  Gait belt    Activity Tolerance  Patient tolerated treatment well    Behavior During Therapy  [facility] for tasks assessed/performed       Past Medical History:  Diagnosis Date  . Cancer ([place])    ling  . Cataract   . Chest pain   . Chronic kidney disease    d/t DM  . Diabetes mellitus    Vgo disposal insulin bolus  simular to insulin pump  . Dyspnea   . GERD (gastroesophageal reflux disease)   . Hyperlipidemia   . Hypertension   . Idiopathic pulmonary fibrosis ([place]) 11/2016  . Neuromuscular disorder ([place])   . PONV (postoperative nausea and vomiting)   . Sleep apnea     uses cpap asked to bring mask and tubing    Past Surgical History:  Procedure Laterality Date  . ANTERIOR FUSION CERVICAL SPINE  2012  . CARDIAC CATHETERIZATION  2011  . CARDIAC CATHETERIZATION N/A 11/09/2016   Procedure: Right Heart Cath;  Surgeon: Belva Crome, MD;  Location: [facility];  Service: Cardiovascular;  Laterality: N/A;  . carpel tunnel     left wrist  . CATARACT EXTRACTION W/ INTRAOCULAR LENS  IMPLANT, BILATERAL  2013  . CERVICAL LAMINECTOMY  2012  . COLONOSCOPY N/A 01/14/2013   Procedure: COLONOSCOPY;  Surgeon: Irene Shipper, MD;  [facility];  Service: Endoscopy;  Laterality: N/A;  . EYE SURGERY    . [place]   left  . LUMBAR LAMINECTOMY  2003  . LUNG BIOPSY Left 12/26/2016   Procedure: LUNG BIOPSY;  Surgeon: [name], MD;  Location: [facility];  Service: Thoracic;  Laterality: Left;  . POSTERIOR FUSION CERVICAL SPINE  2012  . TRIGGER FINGER RELEASE  2011   4th finger left hand  . VIDEO ASSISTED THORACOSCOPY Left 12/26/2016   Procedure: VIDEO ASSISTED THORACOSCOPY;  Surgeon: [name], MD;  Location: [facility];  Service: Thoracic;  Laterality: Left;  Marland Kitchen VIDEO BRONCHOSCOPY N/A 12/26/2016   Procedure: VIDEO BRONCHOSCOPY;  Surgeon: [name], MD;  Location: [facility];  Service: Thoracic;  Laterality: N/A;    There were no vitals filed for this visit.      09/27/18 1455  Symptoms/Limitations  Subjective I have a balance issue and walking issue. Sometimes my feet don't go where I want them to go.   Patient is accompained by: Family member Clair Gulling, [name])  Pertinent History interstitial lung dz,, DM, arthralgia, DM, HTN, HLD, ACDF 2012 required emergent cervical laminectomy several days after ACDF due to hematoma and cord compresssion  Patient Stated Goals Be able to walk without wobbling and not fall  Pain Assessment  Currently in Pain? No/denies  09/27/18 1455  Assessment  Medical Diagnosis chronic gait unsteadiness  Referring Provider (PT) Burnard Bunting, MD  Onset Date/Surgical Date  (referral 09/12/18)  Prior Therapy long ago after surgeries  Precautions  Precautions Fall  Restrictions  Weight Bearing Restrictions No  Balance Screen  Has the patient fallen in the past 6 months Yes  How many times? 4 (feels it is due to feet not working right; some dizziness)  Has the patient had a decrease in activity level because of a fear of falling?  Yes  Is the patient reluctant to leave their home because of a fear of falling?  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, occupational his rental properties  Leisure watching TV; workshop with saws (but he doesn't do much due to fall risk)  Cognition  Overall Cognitive Status Within Functional Limits for tasks assessed  Sensation  Light Touch Impaired by gross assessment  Proprioception Appears Intact (intact at ankles)  Coordination  Gross Motor Movements are Fluid and Coordinated Yes  Fine Motor Movements are Fluid and Coordinated No (RAM degrades)  Posture/Postural Control  Posture/Postural Control No significant limitations  ROM / Strength  AROM / PROM / Strength AROM;Strength;PROM  AROM  Overall AROM  Deficits  Overall AROM Comments rt DF with supination, not full range  PROM  Overall PROM  Deficits  Overall PROM Comments rt DF limited to neutal  Strength  Overall Strength Deficits  Overall Strength Comments rt ankle DF 3-; knee extension 3+  Transfers  Transfers Sit to Stand;Stand to Sit  Sit to Stand 6: Modified independent (Device/Increase time)  Five time sit to stand comments  19.03 (with UEs on armrests; >12 seconds indicates incr fall risk)  Ambulation/Gait  Ambulation/Gait Yes  Ambulation/Gait Assistance 5: Supervision  Ambulation/Gait Assistance Details apears unsteady  Ambulation Distance (Feet) 80 Feet  Assistive device Straight cane  Gait Pattern Step-through pattern;Wide base of support;Decreased step length - left;Decreased stance time - right;Decreased dorsiflexion - right;Poor foot clearance - right  Ambulation Surface Indoor  Gait velocity  32.8/11.47sec=2.86 ft/sec  (normal is 3.08 ft/sec)  Standardized Balance Assessment  Standardized Balance Assessment TUG  Timed Up and Go Test  Normal TUG (seconds) 17.25                Objective measurements completed on examination: See above findings.              PT Education - 09/29/18 1641    Education Details  results of evaluation and PT POC; 3 systems contributing to maintain balance; bil foot numbness significantly impacting balance and doubtful can progress to no cane    Person(s) Educated  Patient;Spouse    Methods  Explanation    Comprehension  Verbalized understanding;Need further instruction       PT Short Term Goals - 09/29/18 1723      PT SHORT TERM GOAL #1   Title  Patient will be independent with HEP to address decr balance and LE weakness (Target for all STGs 10/27/2018)    Time  4  Period  Weeks    Status  New      PT SHORT TERM GOAL #2   Title  Patient will demonstrate reduced fall risk with TUG <15 seconds with LRAD    Baseline  17.25 sec    Time  4    Period  Weeks    Status  New      PT SHORT TERM GOAL #3   Title  Patient will demonstrate reduced fall risk with 5xSTS <16 sec    Baseline  19.03 sec    Time  4    Period  Weeks    Status  New      PT SHORT TERM GOAL #4   Title  Patient will complete home assessment (based on provided education) to reduce fall risk related to home environment.     Time  4    Period  Weeks    Status  New      PT SHORT TERM GOAL #5   Title  Patient will complete Berg Balance assessment by 4th week for further delineation of appropriate assistive device and fall risk    Time  4    Period  Weeks    Status  New        PT Long Term Goals - 09/29/18 1734      PT LONG TERM GOAL #1   Title  Patient will be independent with updated HEP (Target for all LTGs 11/26/2018)    Time  8    Period  Weeks    Status  New    Target Date  11/26/18      PT LONG TERM GOAL #2   Title  Demonstrate  decr fall risk with TUG <13.5 sec with LRAD    Time  8    Period  Weeks    Status  New      PT LONG TERM GOAL #3   Title  Demonstrate decr fall risk with 5xSTS <12.0 sec    Time  8    Period  Weeks    Status  New      PT LONG TERM GOAL #4   Title  Demonstrate lesser fall risk with improving Berg Balance score by 5 points compared to baseline    Time  8    Period  Weeks    Status  New             Plan - 09/29/18 1650    Clinical Impression Statement  Patient referred to OPPT for falls. He has had at least 4 falls in 6 months. TUG and 5xSTS indicative of incr fall risk with his gait velocity below normal for age. He is motivated to reduce his fall risk and risk of serious injury. Pt can benefit from PT interventions listed below to address the additional deficits listed below.     History and Personal Factors relevant to plan of care:  PMH-interstitial lung dz, arthralgia, DM, HTN, HLD, ACDF 2012 required emergent cervical laminectomy several days after ACDF due to hematoma and cord compresssion; Personal factors-none    Clinical Presentation  Unstable    Clinical Presentation due to:  increasing frequency of falls    Clinical Decision Making  Moderate    Rehab Potential  Good    Clinical Impairments Affecting Rehab Potential  decr sensation bil feet; decr use of bil UEs due to prior cervical cord compression    PT Frequency  2x / week    PT Duration  8 weeks    PT Treatment/Interventions  ADLs/Self Care Home Management;Aquatic Therapy;Gait training;DME Instruction;Stair training;Functional mobility training;Therapeutic activities;Therapeutic exercise;Balance training;Neuromuscular re-education;Patient/family education;Orthotic Fit/Training;Manual techniques;Passive range of motion;Vestibular    PT Next Visit Plan  *Anticipate may not need 8 weeks.  Assess ability to benefit from Rt AFO or foot up brace for rt foot clearance; complete Berg to assist in selecting approp assistive  device; educate in safe use of cane (or walker if more approp); initiate HEP for balance    Consulted and Agree with Plan of Care  Patient;Family member/caregiver    Family Member Consulted  wife, [name]       Patient will benefit from skilled therapeutic intervention in order to improve the following deficits and impairments:  Abnormal gait, Cardiopulmonary status limiting activity, Decreased balance, Decreased mobility, Decreased knowledge of use of DME, Decreased knowledge of precautions, Decreased endurance, Decreased strength, Decreased range of motion, Impaired sensation, Impaired UE functional use  Visit Diagnosis: Unsteadiness on feet - Plan: PT plan of care cert/re-cert  Other abnormalities of gait and mobility - Plan: PT plan of care cert/re-cert  Muscle weakness (generalized) - Plan: PT plan of care cert/re-cert  History of falling - Plan: PT plan of care cert/re-cert     Problem List Patient Active Problem List   Diagnosis Date Noted  . Syncope 04/27/2018  . Hypoglycemia due to insulin 01/06/2018  . Essential hypertension 01/06/2018  . Diabetes mellitus type 2, with complication, on long term insulin pump ([place]) 01/06/2018  . Syncope and collapse 01/05/2018  . Encounter for therapeutic drug monitoring 06/29/2017  . Obstructive sleep apnea 05/05/2017  . Cancer ([place])   . 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[place]) 01/05/2017  . Abnormal chest x-ray 10/12/2016  . Benign neoplasm of colon 01/14/2013    Rexanne Mano, PT 09/29/2018, 5:51 PM  [facility] [address] [place], [place], [address] Phone: [phone]   Fax:  [phone]  Name: Shane [name] MRN: [id number] Date of Birth: Apr 11, 1941

## 2018-10-02 ENCOUNTER — Ambulatory Visit (INDEPENDENT_AMBULATORY_CARE_PROVIDER_SITE_OTHER)
Admission: RE | Admit: 2018-10-02 | Discharge: 2018-10-02 | Disposition: A | Discharge status: Home / Self Care | Payer: Self-pay | Source: Ambulatory Visit | Attending: Internal Medicine | Admitting: Internal Medicine

## 2018-10-02 ENCOUNTER — Encounter (HOSPITAL_COMMUNITY)
Admission: RE | Admit: 2018-10-02 | Discharge: 2018-10-02 | Disposition: A | Discharge status: Home / Self Care | Payer: Medicare Other | Source: Ambulatory Visit | Attending: Internal Medicine | Admitting: Internal Medicine

## 2018-10-02 DIAGNOSIS — J84112 Idiopathic pulmonary fibrosis: Secondary | ICD-10-CM

## 2018-10-02 DIAGNOSIS — Z006 Encounter for examination for normal comparison and control in clinical research program: Secondary | ICD-10-CM

## 2018-10-03 ENCOUNTER — Encounter: Payer: Self-pay | Admitting: Physical Therapy

## 2018-10-03 ENCOUNTER — Other Ambulatory Visit: Payer: Medicare Other

## 2018-10-03 ENCOUNTER — Ambulatory Visit: Payer: Medicare Other | Admitting: Physical Therapy

## 2018-10-03 DIAGNOSIS — R2689 Other abnormalities of gait and mobility: Secondary | ICD-10-CM

## 2018-10-03 DIAGNOSIS — M6281 Muscle weakness (generalized): Secondary | ICD-10-CM

## 2018-10-03 DIAGNOSIS — R2681 Unsteadiness on feet: Secondary | ICD-10-CM | POA: Diagnosis not present

## 2018-10-03 DIAGNOSIS — Z9181 History of falling: Secondary | ICD-10-CM

## 2018-10-03 NOTE — Therapy (Signed)
[facility] [address] [place], [place], [address] Phone: [phone]   Fax:  [phone]  Physical Therapy Treatment  Patient Details  Name: Shane [name] MRN: [id number] Date of Birth: 08/13/1941 Referring Provider (PT): Burnard Bunting, MD   Encounter Date: 10/03/2018  PT End of Session - 10/03/18 0805    Visit Number  2    Number of Visits  17    Date for PT Re-Evaluation  11/26/18    Authorization Type  [employer]HC Medicare    Authorization Time Period  09/27/18 to 12/26/2018    PT Start Time  0804    PT Stop Time  0845    PT Time Calculation (min)  41 min    Equipment Utilized During Treatment  Gait belt;Other (comment)   foot up, posterior ottobock walk on brace   Activity Tolerance  Patient tolerated treatment well    Behavior During Therapy  [facility] for tasks assessed/performed       Past Medical History:  Diagnosis Date  . 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 Cataract   . Chest pain   . Chronic kidney disease    d/t DM  . Diabetes mellitus    Vgo disposal insulin bolus  simular to insulin pump  . Dyspnea   . GERD (gastroesophageal reflux disease)   . Hyperlipidemia   . Hypertension   . Idiopathic pulmonary fibrosis ([place]) 11/2016  . Neuromuscular disorder ([place])   . PONV (postoperative nausea and vomiting)   . Sleep apnea     uses cpap asked to bring mask and tubing    Past Surgical History:  Procedure Laterality Date  . ANTERIOR FUSION CERVICAL SPINE  2012  . CARDIAC CATHETERIZATION  2011  . CARDIAC CATHETERIZATION N/A 11/09/2016   Procedure: Right Heart Cath;  Surgeon: Belva Crome, MD;  Location: [facility];  Service: Cardiovascular;  Laterality: N/A;  . carpel tunnel     left wrist  . CATARACT EXTRACTION W/ INTRAOCULAR LENS  IMPLANT, BILATERAL  2013  . CERVICAL LAMINECTOMY  2012  . COLONOSCOPY N/A 01/14/2013   Procedure: COLONOSCOPY;  Surgeon: Irene Shipper, MD;  [facility];  Service:  Endoscopy;  Laterality: N/A;  . EYE SURGERY    . [place]   left  . LUMBAR LAMINECTOMY  2003  . LUNG BIOPSY Left 12/26/2016   Procedure: LUNG BIOPSY;  Surgeon: [name], MD;  Location: [facility];  Service: Thoracic;  Laterality: Left;  . POSTERIOR FUSION CERVICAL SPINE  2012  . TRIGGER FINGER RELEASE  2011   4th finger left hand  . VIDEO ASSISTED THORACOSCOPY Left 12/26/2016   Procedure: VIDEO ASSISTED THORACOSCOPY;  Surgeon: [name], MD;  Location: [facility];  Service: Thoracic;  Laterality: Left;  [place] Kitchen VIDEO BRONCHOSCOPY N/A 12/26/2016   Procedure: VIDEO BRONCHOSCOPY;  Surgeon: [name], MD;  Location: [facility];  Service: Thoracic;  Laterality: N/A;    There were no vitals filed for this visit.  Subjective Assessment - 10/03/18 0805    Subjective  No new complaints. No falls or pain to report.     Pertinent History  interstitial lung dz,, DM, arthralgia, DM, HTN, HLD, ACDF 2012 required emergent cervical laminectomy several days after ACDF due to hematoma and cord compresssion    Patient Stated Goals  Be able to walk without wobbling and not fall    Currently in Pain?  No/denies  [facility] PT Assessment - 10/03/18 0806      Standardized Balance Assessment   Standardized Balance Assessment  Berg Balance Test      Berg Balance Test   Sit to Stand  Able to stand without using hands and stabilize independently    Standing Unsupported  Able to stand 2 minutes with supervision    Sitting with Back Unsupported but Feet Supported on Floor or Stool  Able to sit safely and securely 2 minutes    Stand to Sit  Controls descent by using hands    Transfers  Able to transfer safely, definite need of hands    Standing Unsupported with Eyes Closed  Able to stand 3 seconds    Standing Ubsupported with Feet Together  Able to place feet together independently and stand for 1 minute with supervision    From Standing, Reach Forward with Outstretched Arm  Can reach  forward >12 cm safely (5")   8 inches   From Standing Position, Pick up Object from Floor  Able to pick up shoe, needs supervision    From Standing Position, Turn to Look Behind Over each Shoulder  Needs supervision when turning    Turn 360 Degrees  Needs close supervision or verbal cueing    Standing Unsupported, Alternately Place Feet on Step/Stool  Able to complete >2 steps/needs minimal assist    Standing Unsupported, One Foot in Front  Needs help to step but can hold 15 seconds   step independently, LOB at ~15 sec's   Standing on One Leg  Tries to lift leg/unable to hold 3 seconds but remains standing independently    Total Score  33          [facility] Adult PT Treatment/Exercise - 10/03/18 0822      Transfers   Transfers  Sit to Stand;Stand to Sit    Sit to Stand  6: Modified independent (Device/Increase time)    Stand to Sit  6: Modified independent (Device/Increase time)      Ambulation/Gait   Ambulation/Gait  Yes    Ambulation/Gait Assistance  4: Min guard    Ambulation/Gait Assistance Details  use of foot up brace with 1st lap. trial of posterior Ottobock with 2cd lap.     Ambulation Distance (Feet)  230 Feet   x1, 115 x1   Assistive device  Straight cane    Gait Pattern  Step-through pattern;Wide base of support;Decreased step length - left;Decreased stance time - right;Decreased dorsiflexion - right;Poor foot clearance - right    Ambulation Surface  Level;Indoor      Self-Care   Self-Care  Other Self-Care Comments    Other Self-Care Comments   provided pt ordering information on obtaining the Foot up brace.                PT Short Term Goals - 09/29/18 1723      PT SHORT TERM GOAL #1   Title  Patient will be independent with HEP to address decr balance and LE weakness (Target for all STGs 10/27/2018)    Time  4    Period  Weeks    Status  New      PT SHORT TERM GOAL #2   Title  Patient will demonstrate reduced fall risk with TUG <15 seconds with LRAD     Baseline  17.25 sec    Time  4    Period  Weeks    Status  New      PT SHORT  TERM GOAL #3   Title  Patient will demonstrate reduced fall risk with 5xSTS <16 sec    Baseline  19.03 sec    Time  4    Period  Weeks    Status  New      PT SHORT TERM GOAL #4   Title  Patient will complete home assessment (based on provided education) to reduce fall risk related to home environment.     Time  4    Period  Weeks    Status  New      PT SHORT TERM GOAL #5   Title  Patient will complete Berg Balance assessment by 4th week for further delineation of appropriate assistive device and fall risk    Time  4    Period  Weeks    Status  New        PT Long Term Goals - 09/29/18 1734      PT LONG TERM GOAL #1   Title  Patient will be independent with updated HEP (Target for all LTGs 11/26/2018)    Time  8    Period  Weeks    Status  New    Target Date  11/26/18      PT LONG TERM GOAL #2   Title  Demonstrate decr fall risk with TUG <13.5 sec with LRAD    Time  8    Period  Weeks    Status  New      PT LONG TERM GOAL #3   Title  Demonstrate decr fall risk with 5xSTS <12.0 sec    Time  8    Period  Weeks    Status  New      PT LONG TERM GOAL #4   Title  Demonstrate lesser fall risk with improving Berg Balance score by 5 points compared to baseline    Time  8    Period  Weeks    Status  New         10/03/18 0806  Plan  Clinical Impression Statement Today's skilled session focused on establishing baseline values for Berg Balance Test with pt falling in a high risk for falls category. Remainder of session focused on trial of foot up brace vs Ottobock posterior brace with pt preferrring the foot up brace. Pt provided the ordering information from [employer]. The pt is progressing toward goals and should benefit from continued PT to progress toward unmet goals.                        Pt will benefit from skilled therapeutic intervention in order to improve on the following deficits  Abnormal gait;Cardiopulmonary status limiting activity;Decreased balance;Decreased mobility;Decreased knowledge of use of DME;Decreased knowledge of precautions;Decreased endurance;Decreased strength;Decreased range of motion;Impaired sensation;Impaired UE functional use  Rehab Potential Good  Clinical Impairments Affecting Rehab Potential decr sensation bil feet; decr use of bil UEs due to prior cervical cord compression  PT Frequency 2x / week  PT Duration 8 weeks  PT Treatment/Interventions ADLs/Self Care Home Management;Aquatic Therapy;Gait training;DME Instruction;Stair training;Functional mobility training;Therapeutic activities;Therapeutic exercise;Balance training;Neuromuscular re-education;Patient/family education;Orthotic Fit/Training;Manual techniques;Passive range of motion;Vestibular  PT Next Visit Plan *Anticipate may not need 8 weeks.   initiate HEP for balance. see if pt has ordered foot up brace. continue with gait training with foot up brace on barriers/outdoor surfaces; balance activities on complaint surfaces without/with vision removed.   Consulted and Agree with Plan of Care Patient;Family member/caregiver  Family Member  Consulted wife, [name]          Patient will benefit from skilled therapeutic intervention in order to improve the following deficits and impairments:  Abnormal gait, Cardiopulmonary status limiting activity, Decreased balance, Decreased mobility, Decreased knowledge of use of DME, Decreased knowledge of precautions, Decreased endurance, Decreased strength, Decreased range of motion, Impaired sensation, Impaired UE functional use  Visit Diagnosis: Unsteadiness on feet  Other abnormalities of gait and mobility  Muscle weakness (generalized)  History of falling     Problem List Patient Active Problem List   Diagnosis Date Noted  . Syncope 04/27/2018  . Hypoglycemia due to insulin 01/06/2018  . Essential hypertension 01/06/2018  . Diabetes  mellitus type 2, with complication, on long term insulin pump ([place]) 01/06/2018  . Syncope and collapse 01/05/2018  . Encounter for therapeutic drug monitoring 06/29/2017  . Obstructive sleep apnea 05/05/2017  . Cancer ([place])   . 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[place]) 01/05/2017  . Abnormal chest x-ray 10/12/2016  . Benign neoplasm of colon 01/14/2013    Willow Ora, PTA, [facility] [address] [place], [place] [address] [phone] 10/04/18, 9:41 PM   Name: TAKODA JANOWIAK MRN: [id number] Date of Birth: [birth year]

## 2018-10-04 ENCOUNTER — Encounter (HOSPITAL_COMMUNITY): Payer: Self-pay

## 2018-10-09 ENCOUNTER — Encounter (HOSPITAL_COMMUNITY): Payer: Self-pay

## 2018-10-09 ENCOUNTER — Ambulatory Visit: Payer: Medicare Other | Admitting: Physical Therapy

## 2018-10-09 DIAGNOSIS — Z79899 Other long term (current) drug therapy: Secondary | ICD-10-CM | POA: Insufficient documentation

## 2018-10-09 DIAGNOSIS — Z7982 Long term (current) use of aspirin: Secondary | ICD-10-CM | POA: Insufficient documentation

## 2018-10-09 DIAGNOSIS — N189 Chronic kidney disease, unspecified: Secondary | ICD-10-CM | POA: Insufficient documentation

## 2018-10-09 DIAGNOSIS — E785 Hyperlipidemia, unspecified: Secondary | ICD-10-CM | POA: Insufficient documentation

## 2018-10-09 DIAGNOSIS — K219 Gastro-esophageal reflux disease without esophagitis: Secondary | ICD-10-CM | POA: Insufficient documentation

## 2018-10-09 DIAGNOSIS — Z794 Long term (current) use of insulin: Secondary | ICD-10-CM | POA: Insufficient documentation

## 2018-10-09 DIAGNOSIS — E1122 Type 2 diabetes mellitus with diabetic chronic kidney disease: Secondary | ICD-10-CM | POA: Insufficient documentation

## 2018-10-09 DIAGNOSIS — I129 Hypertensive chronic kidney disease with stage 1 through stage 4 chronic kidney disease, or unspecified chronic kidney disease: Secondary | ICD-10-CM | POA: Insufficient documentation

## 2018-10-09 DIAGNOSIS — G4730 Sleep apnea, unspecified: Secondary | ICD-10-CM | POA: Insufficient documentation

## 2018-10-09 DIAGNOSIS — J84112 Idiopathic pulmonary fibrosis: Secondary | ICD-10-CM | POA: Insufficient documentation

## 2018-10-11 ENCOUNTER — Encounter (HOSPITAL_COMMUNITY)
Admission: RE | Admit: 2018-10-11 | Discharge: 2018-10-11 | Disposition: A | Discharge status: Home / Self Care | Payer: Self-pay | Source: Ambulatory Visit | Attending: Internal Medicine | Admitting: Internal Medicine

## 2018-10-12 ENCOUNTER — Ambulatory Visit: Payer: Medicare Other | Attending: Internal Medicine | Admitting: Physical Therapy

## 2018-10-12 ENCOUNTER — Encounter: Payer: Self-pay | Admitting: Physical Therapy

## 2018-10-12 DIAGNOSIS — R2689 Other abnormalities of gait and mobility: Secondary | ICD-10-CM | POA: Insufficient documentation

## 2018-10-12 DIAGNOSIS — M6281 Muscle weakness (generalized): Secondary | ICD-10-CM | POA: Insufficient documentation

## 2018-10-12 DIAGNOSIS — R2681 Unsteadiness on feet: Secondary | ICD-10-CM | POA: Diagnosis present

## 2018-10-12 NOTE — Patient Instructions (Addendum)
Access Code: [email or web address]  URL: [email or web address]  Date: 10/12/2018  Prepared by: Barry Brunner   Exercises  Corner with eyes closed - 3 reps - 1 sets - 1x daily - 7x weekly  Romberg Stance with Head Nods - 10 reps - 1 sets - 1x daily - 7x weekly  Tandem Walking with Counter Support - 5 reps - 1 sets - 1x daily - 7x weekly  Heel rises with counter support - 10 reps - 1 sets - 3 seconds hold - 1x daily - 7x weekly  Toe Raises with Counter Support - 10 reps - 1 sets - 1x daily - 7x weekly

## 2018-10-12 NOTE — Therapy (Signed)
[facility] [address] [place], [place], [address] Phone: [phone]   Fax:  [phone]  Physical Therapy Treatment  Patient Details  Name: Shane [name] MRN: [id number] Date of Birth: [birth year] Referring Provider (PT): Burnard Bunting, MD   Encounter Date: 10/12/2018  PT End of Session - 10/12/18 0805    Visit Number  3    Number of Visits  17    Date for PT Re-Evaluation  11/26/18    Authorization Type  [employer] Medicare    Authorization Time Period  09/27/18 to 12/26/2018    PT Start Time  0802    PT Stop Time  0847    PT Time Calculation (min)  45 min    Equipment Utilized During Treatment  Other (comment)   pt's foot up brace on the right; plans to buy one for the left   Activity Tolerance  Patient tolerated treatment well    Behavior During Therapy  [facility] for tasks assessed/performed       Past Medical History:  Diagnosis Date  . Cancer ([place])    ling  . Cataract   . Chest pain   . Chronic kidney disease    d/t DM  . Diabetes mellitus    Vgo disposal insulin bolus  simular to insulin pump  . Dyspnea   . GERD (gastroesophageal reflux disease)   . Hyperlipidemia   . Hypertension   . Idiopathic pulmonary fibrosis ([place]) 11/2016  . Neuromuscular disorder ([place])   . PONV (postoperative nausea and vomiting)   . Sleep apnea     uses cpap asked to bring mask and tubing    Past Surgical History:  Procedure Laterality Date  . ANTERIOR FUSION CERVICAL SPINE  2012  . CARDIAC CATHETERIZATION  2011  . CARDIAC CATHETERIZATION N/A 11/09/2016   Procedure: Right Heart Cath;  Surgeon: Belva Crome, MD;  Location: [facility];  Service: Cardiovascular;  Laterality: N/A;  . carpel tunnel     left wrist  . CATARACT EXTRACTION W/ INTRAOCULAR LENS  IMPLANT, BILATERAL  2013  . CERVICAL LAMINECTOMY  2012  . COLONOSCOPY N/A 01/14/2013   Procedure: COLONOSCOPY;  Surgeon: Irene Shipper, MD;  Location: [facility];   Service: Endoscopy;  Laterality: N/A;  . EYE SURGERY    . [place]   left  . LUMBAR LAMINECTOMY  2003  . LUNG BIOPSY Left 12/26/2016   Procedure: LUNG BIOPSY;  Surgeon: [name], MD;  Location: [facility];  Service: Thoracic;  Laterality: Left;  . POSTERIOR FUSION CERVICAL SPINE  2012  . TRIGGER FINGER RELEASE  2011   4th finger left hand  . VIDEO ASSISTED THORACOSCOPY Left 12/26/2016   Procedure: VIDEO ASSISTED THORACOSCOPY;  Surgeon: [name], MD;  Location: [facility];  Service: Thoracic;  Laterality: Left;  [place]land Kitchen VIDEO BRONCHOSCOPY N/A 12/26/2016   Procedure: VIDEO BRONCHOSCOPY;  Surgeon: [name], MD;  Location: [facility];  Service: Thoracic;  Laterality: N/A;    There were no vitals filed for this visit.  Subjective Assessment - 10/12/18 0804    Subjective  Ordered and received his foot up brace (he is using on the right foot). States he can really tell it is helping and plans to order one for his left foot. When I lie down and move my head, things look like they are moving. New Years 12-15 yrs ago was the worst vertigo he has had--at a party and all the  movement made him nauseated, vomiting. and ended up hospitalized for dehydration. Has never been that bad again. 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 If he really concentrates, he can merge the images.     Pertinent History  interstitial lung dz,, DM, arthralgia, DM, HTN, HLD, ACDF 2012 required emergent cervical laminectomy several days after ACDF due to hematoma and cord compresssion    Patient Stated Goals  Be able to walk without wobbling and not fall    Currently in Pain?  No/denies         Treatment- Educated in the following HEP with pt performing incr # reps for education purposes.  Access Code: [email or web address]   Exercises  Corner with eyes closed - 3 reps - 1 sets - 1x daily - 7x weekly  Romberg Stance with Head Nods - 10 reps - 1 sets - 1x daily - 7x weekly  Tandem  Walking with Counter Support - 5 reps - 1 sets - 1x daily - 7x weekly  Heel rises with counter support - 10 reps - 1 sets - 3 seconds hold - 1x daily - 7x weekly  Toe Raises with Counter Support - 10 reps - 1 sets - 1x daily - 7x weekly                        PT Education - 10/12/18 1221    Education Details  see HEP    Person(s) Educated  Patient    Methods  Explanation;Demonstration;Verbal cues;Handout    Comprehension  Verbalized understanding;Returned demonstration;Verbal cues required;Need further instruction       PT Short Term Goals - 09/29/18 1723      PT SHORT TERM GOAL #1   Title  Patient will be independent with HEP to address decr balance and LE weakness (Target for all STGs 10/27/2018)    Time  4    Period  Weeks    Status  New      PT SHORT TERM GOAL #2   Title  Patient will demonstrate reduced fall risk with TUG <15 seconds with LRAD    Baseline  17.25 sec    Time  4    Period  Weeks    Status  New      PT SHORT TERM GOAL #3   Title  Patient will demonstrate reduced fall risk with 5xSTS <16 sec    Baseline  19.03 sec    Time  4    Period  Weeks    Status  New      PT SHORT TERM GOAL #4   Title  Patient will complete home assessment (based on provided education) to reduce fall risk related to home environment.     Time  4    Period  Weeks    Status  New      PT SHORT TERM GOAL #5   Title  Patient will complete Berg Balance assessment by 4th week for further delineation of appropriate assistive device and fall risk    Time  4    Period  Weeks    Status  New        PT Long Term Goals - 09/29/18 1734      PT LONG TERM GOAL #1   Title  Patient will be independent with updated HEP (Target for all LTGs 11/26/2018)    Time  8    Period  Weeks    Status  New  Target Date  11/26/18      PT LONG TERM GOAL #2   Title  Demonstrate decr fall risk with TUG <13.5 sec with LRAD    Time  8    Period  Weeks    Status  New      PT LONG  TERM GOAL #3   Title  Demonstrate decr fall risk with 5xSTS <12.0 sec    Time  8    Period  Weeks    Status  New      PT LONG TERM GOAL #4   Title  Demonstrate lesser fall risk with improving Berg Balance score by 5 points compared to baseline    Time  8    Period  Weeks    Status  New            Plan - 10/12/18 1223    Clinical Impression Statement  Session focused on establishing HEP for balance (including targeting vestibular system--pt has had long history of vestibular issues and double vision). As educating on HEP, also had pt stand in corner and close his eyes with immediate incr sway and pt grabbing to hold onto wall or chair. Then had pt place 1 finger on the chair back and close eyes and pt with very minimal sway. Discussed reason and this is why he needs to have his cane with him at all times. Pt very receptive and appreciative of education (incliuding HEP). Continue PT to reduce fall risk.     Rehab Potential  Good    Clinical Impairments Affecting Rehab Potential  decr sensation bil feet; decr use of bil UEs due to prior cervical cord compression    PT Frequency  2x / week    PT Duration  8 weeks    PT Treatment/Interventions  ADLs/Self Care Home Management;Aquatic Therapy;Gait training;DME Instruction;Stair training;Functional mobility training;Therapeutic activities;Therapeutic exercise;Balance training;Neuromuscular re-education;Patient/family education;Orthotic Fit/Training;Manual techniques;Passive range of motion;Vestibular    PT Next Visit Plan  *Anticipate may not need 8 weeks. Give fall prevention information. Chk HEP given 11/8 for balance. see if pt has gotten his second foot up brace for left foot. continue with gait training with foot up brace on barriers/outdoor surfaces; balance activities on complaint surfaces without/with vision removed.     Consulted and Agree with Plan of Care  Patient;Family member/caregiver    Family Member Consulted  wife, [name]        Patient will benefit from skilled therapeutic intervention in order to improve the following deficits and impairments:  Abnormal gait, Cardiopulmonary status limiting activity, Decreased balance, Decreased mobility, Decreased knowledge of use of DME, Decreased knowledge of precautions, Decreased endurance, Decreased strength, Decreased range of motion, Impaired sensation, Impaired UE functional use  Visit Diagnosis: Unsteadiness on feet  Other abnormalities of gait and mobility  Muscle weakness (generalized)     Problem List Patient Active Problem List   Diagnosis Date Noted  . Syncope 04/27/2018  . Hypoglycemia due to insulin 01/06/2018  . Essential hypertension 01/06/2018  . Diabetes mellitus type 2, with complication, on long term insulin pump ([place]) 01/06/2018  . Syncope and collapse 01/05/2018  . Encounter for therapeutic drug monitoring 06/29/2017  . Obstructive sleep apnea 05/05/2017  . Cancer ([place])   . 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[place]) 01/05/2017  . Abnormal chest x-ray 10/12/2016  . Benign neoplasm of colon 01/14/2013    Rexanne Mano, PT 10/12/2018, 12:30 PM  [facility] [address] [place], [place],  [address] Phone: [phone]   Fax:  [phone]  Name: SHIZUO BISKUP MRN: [id number] Date of Birth: [birth year]

## 2018-10-16 ENCOUNTER — Encounter: Payer: Self-pay | Admitting: Physical Therapy

## 2018-10-16 ENCOUNTER — Ambulatory Visit: Payer: Medicare Other | Admitting: Internal Medicine

## 2018-10-16 ENCOUNTER — Encounter (HOSPITAL_COMMUNITY)
Admission: RE | Admit: 2018-10-16 | Discharge: 2018-10-16 | Disposition: A | Discharge status: Home / Self Care | Payer: Self-pay | Source: Ambulatory Visit | Attending: Internal Medicine | Admitting: Internal Medicine

## 2018-10-16 ENCOUNTER — Ambulatory Visit: Payer: Medicare Other | Admitting: Physical Therapy

## 2018-10-16 DIAGNOSIS — R2689 Other abnormalities of gait and mobility: Secondary | ICD-10-CM

## 2018-10-16 DIAGNOSIS — R2681 Unsteadiness on feet: Secondary | ICD-10-CM

## 2018-10-16 NOTE — Therapy (Addendum)
[facility] [address] [place], [place], [address] Phone: [phone]   Fax:  [phone]  Physical Therapy Treatment  Patient Details  Name: Shane [name] MRN: [id number] Date of Birth: 1941-03-16 Referring Provider (PT): Burnard Bunting, MD   Encounter Date: 10/16/2018  PT End of Session - 10/16/18 0837    Visit Number  4    Number of Visits  17    Date for PT Re-Evaluation  11/26/18    Authorization Type  [employer] Medicare    Authorization Time Period  09/27/18 to 12/26/2018    PT Start Time  0802    PT Stop Time  0844    PT Time Calculation (min)  42 min    Equipment Utilized During Treatment  Other (comment)   pt's foot up braces on both feet   Activity Tolerance  Patient tolerated treatment well    Behavior During Therapy  [facility] for tasks assessed/performed       Past Medical History:  Diagnosis Date  . 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 Cataract   . Chest pain   . Chronic kidney disease    d/t DM  . Diabetes mellitus    Vgo disposal insulin bolus  simular to insulin pump  . Dyspnea   . GERD (gastroesophageal reflux disease)   . Hyperlipidemia   . Hypertension   . Idiopathic pulmonary fibrosis ([place]) 11/2016  . Neuromuscular disorder ([place])   . PONV (postoperative nausea and vomiting)   . Sleep apnea     uses cpap asked to bring mask and tubing    Past Surgical History:  Procedure Laterality Date  . ANTERIOR FUSION CERVICAL SPINE  2012  . CARDIAC CATHETERIZATION  2011  . CARDIAC CATHETERIZATION N/A 11/09/2016   Procedure: Right Heart Cath;  Surgeon: Belva Crome, MD;  Location: [facility];  Service: Cardiovascular;  Laterality: N/A;  . carpel tunnel     left wrist  . CATARACT EXTRACTION W/ INTRAOCULAR LENS  IMPLANT, BILATERAL  2013  . CERVICAL LAMINECTOMY  2012  . COLONOSCOPY N/A 01/14/2013   Procedure: COLONOSCOPY;  Surgeon: Irene Shipper, MD;  [facility];  Service: Endoscopy;   Laterality: N/A;  . EYE SURGERY    . [place]   left  . LUMBAR LAMINECTOMY  2003  . LUNG BIOPSY Left 12/26/2016   Procedure: LUNG BIOPSY;  Surgeon: [name], MD;  Location: [facility];  Service: Thoracic;  Laterality: Left;  . POSTERIOR FUSION CERVICAL SPINE  2012  . TRIGGER FINGER RELEASE  2011   4th finger left hand  . VIDEO ASSISTED THORACOSCOPY Left 12/26/2016   Procedure: VIDEO ASSISTED THORACOSCOPY;  Surgeon: [name], MD;  Location: [facility];  Service: Thoracic;  Laterality: Left;  Marland Kitchen VIDEO BRONCHOSCOPY N/A 12/26/2016   Procedure: VIDEO BRONCHOSCOPY;  Surgeon: [name], MD;  Location: [facility];  Service: Thoracic;  Laterality: N/A;    There were no vitals filed for this visit.  Subjective Assessment - 10/16/18 0804    Subjective  Received his other foot up brace and has just started using it. Aware that he lands on outside of his rt foot and states it's about time for him to buy some new shoes    Pertinent History  interstitial lung dz,, DM, arthralgia, DM, HTN, HLD, ACDF 2012 required emergent cervical laminectomy several days after ACDF due to hematoma and cord compresssion    Patient Stated  Goals  Be able to walk without wobbling and not fall    Currently in Pain?  No/denies                       [facility] Adult PT Treatment/Exercise - 10/16/18 1043      Ambulation/Gait   Ambulation/Gait Assistance  5: Supervision    Ambulation/Gait Assistance Details  initially significantly inverting/supinating rt foot during swing and landing on lateral border of foot. Pt reports he has "rolled" rt ankle numerous times because of this. Rotated pt's foot up brace laterally to increase pull into eversion with some improvement. Educated on proper shoes for supination    Ambulation Distance (Feet)  120 Feet   x 2   Assistive device  Straight cane    Gait Pattern  Step-through pattern;Wide base of support;Decreased dorsiflexion - right;Poor foot  clearance - right;Decreased stride length      Ankle Exercises: Seated   Other Seated Ankle Exercises  ankle eversion (esp RLE) x 10 reps with evertors quickly fatiguing          Balance Exercises - 10/16/18 1054      Balance Exercises: Standing   Stepping Strategy  Anterior;Posterior;Foam/compliant surface;UE support;10 reps   each foot    Balance Beam  blue beam crosswise for hip strategy; static stand with head turns with incr instability and use of UEs to steady himself;  requires light RUE support on // bars to maintain balance; step off/on forward and then backwards x 20 reps    Tandem Gait  Forward;Upper extremity support;4 reps   // bars; RUE only   Retro Gait  Upper extremity support;4 reps    Sidestepping  Foam/compliant support;Upper extremity support   6 reps // bars, emphasize lifting feet   Other Standing Exercises  ankle strategy, ankle sway in // bars (pt with very small ROM within which he can keep his balance        PT Education - 10/16/18 0845    Education Details  additoin to HEP; ankle vs hip vs step strategy    Person(s) Educated  Patient    Methods  Explanation;Demonstration;Verbal cues;Handout    Comprehension  Verbalized understanding;Returned demonstration;Verbal cues required;Need further instruction       PT Short Term Goals - 09/29/18 1723      PT SHORT TERM GOAL #1   Title  Patient will be independent with HEP to address decr balance and LE weakness (Target for all STGs 10/27/2018)    Time  4    Period  Weeks    Status  New      PT SHORT TERM GOAL #2   Title  Patient will demonstrate reduced fall risk with TUG <15 seconds with LRAD    Baseline  17.25 sec    Time  4    Period  Weeks    Status  New      PT SHORT TERM GOAL #3   Title  Patient will demonstrate reduced fall risk with 5xSTS <16 sec    Baseline  19.03 sec    Time  4    Period  Weeks    Status  New      PT SHORT TERM GOAL #4   Title  Patient will complete home assessment  (based on provided education) to reduce fall risk related to home environment.     Time  4    Period  Weeks    Status  New  PT SHORT TERM GOAL #5   Title  Patient will complete Berg Balance assessment by 4th week for further delineation of appropriate assistive device and fall risk    Time  4    Period  Weeks    Status  New        PT Long Term Goals - 09/29/18 1734      PT LONG TERM GOAL #1   Title  Patient will be independent with updated HEP (Target for all LTGs 11/26/2018)    Time  8    Period  Weeks    Status  New    Target Date  11/26/18      PT LONG TERM GOAL #2   Title  Demonstrate decr fall risk with TUG <13.5 sec with LRAD    Time  8    Period  Weeks    Status  New      PT LONG TERM GOAL #3   Title  Demonstrate decr fall risk with 5xSTS <12.0 sec    Time  8    Period  Weeks    Status  New      PT LONG TERM GOAL #4   Title  Demonstrate lesser fall risk with improving Berg Balance score by 5 points compared to baseline    Time  8    Period  Weeks    Status  New              Patient will benefit from skilled therapeutic intervention in order to improve the following deficits and impairments:     Visit Diagnosis: Unsteadiness on feet  Other abnormalities of gait and mobility    10/16/18 1934  Plan  Clinical Impression Statement Skilled session focused on balance training, including activities to elicit ankle vs hip vs stepping strategy for balance recovery. Patient demonstrated very limited ankle sway without LOB, required repeated instruction for use of hip strategy (this did improve with repetition), and difficulty taking large enough step to control imbalance with a single step in posterior direction. Gait training included fitting his new foot-up brace for rt foot (see PN) and discussed will be more effective with regular shoe laces instead of the elastic laces he currently has. Educated on potential options to assist pt with donning/hooking  brace. Also educated on type of shoe to look for when buying his next pair of shoes to help avoid rolling rt ankle. Patient very engaged and making good progress.   Pt will benefit from skilled therapeutic intervention in order to improve on the following deficits Abnormal gait;Cardiopulmonary status limiting activity;Decreased balance;Decreased mobility;Decreased knowledge of use of DME;Decreased knowledge of precautions;Decreased endurance;Decreased strength;Decreased range of motion;Impaired sensation;Impaired UE functional use  Rehab Potential Good  Clinical Impairments Affecting Rehab Potential decr sensation bil feet; decr use of bil UEs due to prior cervical cord compression  PT Frequency 2x / week  PT Duration 8 weeks  PT Treatment/Interventions ADLs/Self Care Home Management;Aquatic Therapy;Gait training;DME Instruction;Stair training;Functional mobility training;Therapeutic activities;Therapeutic exercise;Balance training;Neuromuscular re-education;Patient/family education;Orthotic Fit/Training;Manual techniques;Passive range of motion;Vestibular  PT Next Visit Plan *Anticipate may not need 8 weeks. Give FALL PREVENTION information. continue with gait training with foot up brace on barriers/outdoor surfaces; balance activities on complaint surfaces without/with vision removed.   Consulted and Agree with Plan of Care Patient;Family member/caregiver  Family Member Consulted wife, [name]     Problem List Patient Active Problem List   Diagnosis Date Noted  . Syncope 04/27/2018  . Hypoglycemia due to insulin 01/06/2018  .  Essential hypertension 01/06/2018  . Diabetes mellitus type 2, with complication, on long term insulin pump ([place]) 01/06/2018  . Syncope and collapse 01/05/2018  . Encounter for therapeutic drug monitoring 06/29/2017  . Obstructive sleep apnea 05/05/2017  . Cancer ([place])   . 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[place]) 01/05/2017  . Abnormal chest x-ray 10/12/2016  .  Benign neoplasm of colon 01/14/2013    Rexanne Mano , PT 10/16/2018, 11:00 AM  [facility] [address] [place], [place], [address] Phone: [phone]   Fax:  [phone]  Name: MAUI BRITTEN MRN: [id number] Date of Birth: [birth year]

## 2018-10-16 NOTE — Patient Instructions (Addendum)
Access Code: [email or web address]  URL: [email or web address]  Date: 10/16/2018  Prepared by: Barry Brunner   Exercises  Corner with eyes closed - 3 reps - 1 sets - 1x daily - 7x weekly  Romberg Stance with Head Nods - 10 reps - 1 sets - 1x daily - 7x weekly  Tandem Walking with Counter Support - 5 reps - 1 sets - 1x daily - 7x weekly  Heel rises with counter support - 10 reps - 1 sets - 3 seconds hold - 1x daily - 7x weekly  Toe Raises with Counter Support - 10 reps - 1 sets - 1x daily - 7x weekly   ADDED TODAY seated ankle eversion - 10 reps - 1 sets - - hold - 1-3x daily - 5x weekly

## 2018-10-17 ENCOUNTER — Telehealth: Payer: Self-pay | Admitting: Internal Medicine

## 2018-10-17 NOTE — Telephone Encounter (Signed)
Called patient unable to reach left message to give us a call back.

## 2018-10-18 ENCOUNTER — Encounter: Payer: Self-pay | Admitting: Physical Therapy

## 2018-10-18 ENCOUNTER — Encounter (HOSPITAL_COMMUNITY)
Admission: RE | Admit: 2018-10-18 | Discharge: 2018-10-18 | Disposition: A | Discharge status: Home / Self Care | Payer: Self-pay | Source: Ambulatory Visit | Attending: Internal Medicine | Admitting: Internal Medicine

## 2018-10-18 ENCOUNTER — Ambulatory Visit: Payer: Medicare Other | Admitting: Physical Therapy

## 2018-10-18 DIAGNOSIS — R2681 Unsteadiness on feet: Secondary | ICD-10-CM | POA: Diagnosis not present

## 2018-10-18 DIAGNOSIS — R2689 Other abnormalities of gait and mobility: Secondary | ICD-10-CM

## 2018-10-18 NOTE — Telephone Encounter (Signed)
Let Shane [name] or wife know that just 10/18/2018 / yesterday we got approval that his CT scan qualifies for study. BEcuase of the move we have been slow. So,  A) Cam or Jennfer from research should be reaching out to him next 7 days or so to give updaed and schedule vsiit B) assuming alll else with criteria move forward on his research (most likely it will) next visit will be research. So for now , no regular office visit C) if he has not heard from research team in a week he is to call us back

## 2018-10-18 NOTE — Telephone Encounter (Signed)
Plan from pt's last OV with MR 07/23/18:  Clinically stable Do not still understand dificulty of breath pauses that happen randomly  Plan - can you stop lisinopril ? - can make cough worse - start inspiratory muscle training 10-20 times daily - appreciate interest in [place] and [employer] Phase 3 [employer] trial             - I think you will qualify only for [employer]             - meet Anderson Malta C of research to get a time line for  research visit  Followup 3 months to ILD clinic; we might cancel it if in interim you have research vist  Looked at pt's past appts and saw that pt had a research visit 09/21/18 with [employer].  Called and spoke with [name] explaining to her the reason pt's OV was cancelled. [name] wanted to know when pt should be following up again with MR, whether it be for the research or for a regular ILD clinic appt. Also, they are wanting to know the results of recent tests that were performed.  MR, please advise on this for both pt and his wife [name]. Thanks!

## 2018-10-18 NOTE — Telephone Encounter (Signed)
Called and spoke with [name] letting her know the information per MR and that pt's CT qualified him for the study. Stated to her that [employer] (Cam or Anderson Malta) will be the one to call pt within next week or so to get pt scheduled for research visit and for now, pt will only be having research visits.  [name] expressed understanding. Nothing further needed.

## 2018-10-18 NOTE — Therapy (Signed)
[facility] [address] [place], [place], [address] Phone: [phone]   Fax:  [phone]  Physical Therapy Treatment  Patient Details  Name: Shane [name] MRN: [id number] Date of Birth: 1941-09-12 Referring Provider (PT): Burnard Bunting, MD   Encounter Date: 10/18/2018  PT End of Session - 10/18/18 0801    Visit Number  5    Number of Visits  17    Date for PT Re-Evaluation  11/26/18    Authorization Type  [employer] Medicare    Authorization Time Period  09/27/18 to 12/26/2018    PT Start Time  0800    PT Stop Time  0845    PT Time Calculation (min)  45 min    Equipment Utilized During Treatment  Other (comment)   pt's foot up braces on both feet   Activity Tolerance  Patient tolerated treatment well    Behavior During Therapy  [facility] for tasks assessed/performed       Past Medical History:  Diagnosis Date  . 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 Cataract   . Chest pain   . Chronic kidney disease    d/t DM  . Diabetes mellitus    Vgo disposal insulin bolus  simular to insulin pump  . Dyspnea   . GERD (gastroesophageal reflux disease)   . Hyperlipidemia   . Hypertension   . Idiopathic pulmonary fibrosis ([place]) 11/2016  . Neuromuscular disorder ([place])   . PONV (postoperative nausea and vomiting)   . Sleep apnea     uses cpap asked to bring mask and tubing    Past Surgical History:  Procedure Laterality Date  . ANTERIOR FUSION CERVICAL SPINE  2012  . CARDIAC CATHETERIZATION  2011  . CARDIAC CATHETERIZATION N/A 11/09/2016   Procedure: Right Heart Cath;  Surgeon: Belva Crome, MD;  Location: [facility] [facility];  Service: Cardiovascular;  Laterality: N/A;  . carpel tunnel     left wrist  . CATARACT EXTRACTION W/ INTRAOCULAR LENS  IMPLANT, BILATERAL  2013  . CERVICAL LAMINECTOMY  2012  . COLONOSCOPY N/A 01/14/2013   Procedure: COLONOSCOPY;  Surgeon: Irene Shipper, MD;  [facility];  Service: Endoscopy;   Laterality: N/A;  . EYE SURGERY    . [place]   left  . LUMBAR LAMINECTOMY  2003  . LUNG BIOPSY Left 12/26/2016   Procedure: LUNG BIOPSY;  Surgeon: [name], MD;  Location: [facility];  Service: Thoracic;  Laterality: Left;  . POSTERIOR FUSION CERVICAL SPINE  2012  . TRIGGER FINGER RELEASE  2011   4th finger left hand  . VIDEO ASSISTED THORACOSCOPY Left 12/26/2016   Procedure: VIDEO ASSISTED THORACOSCOPY;  Surgeon: [name], MD;  Location: [place];  Service: Thoracic;  Laterality: Left;  [place] Kitchen VIDEO BRONCHOSCOPY N/A 12/26/2016   Procedure: VIDEO BRONCHOSCOPY;  Surgeon: [name], MD;  Location: [facility];  Service: Thoracic;  Laterality: N/A;    There were no vitals filed for this visit.  Subjective Assessment - 10/18/18 0802    Subjective  No issues, no falls. Only problem with foot up braces is getting the left foot brace hooked. He cannot cross left foot over his rt knee.     Pertinent History  interstitial lung dz,, DM, arthralgia, DM, HTN, HLD, ACDF 2012 required emergent cervical laminectomy several days after ACDF due to hematoma and cord compresssion    Patient Stated Goals  Be able to walk without  wobbling and not fall    Currently in Pain?  No/denies                       [facility] Adult PT Treatment/Exercise - 10/18/18 1935      Ambulation/Gait   Ambulation/Gait Assistance  5: Supervision    Ambulation/Gait Assistance Details  with bil foot up braces; continues rt foot supination can partially correct with vc;     Ambulation Distance (Feet)  120 Feet    Assistive device  Straight cane    Gait Pattern  Step-through pattern;Wide base of support;Decreased dorsiflexion - right;Poor foot clearance - right;Decreased stride length          Balance Exercises - 10/18/18 1931      Balance Exercises: Standing   Standing Eyes Opened  Wide (BOA);Head turns;Foam/compliant surface    SLS  Eyes open;Upper extremity support 1;Upper extremity  support 2;10 secs   facing counter; turned sideways to counter; ~8 reps each leg   SLS with Vectors  Solid surface;Upper extremity assist 1   alternating single, then double taps with cones   Stepping Strategy  Anterior;Foam/compliant surface;UE support;10 reps   red mat at counter   Rockerboard  Anterior/posterior;Lateral;Head turns;EO;UE support    Gait with Head Turns  Forward;Upper extremity support   with cane   Retro Gait  Upper extremity support   with cane   Sidestepping  Foam/compliant support;Upper extremity support   6 reps , red mat       PT Education - 10/18/18 1940    Education Details  addition to HEP    Person(s) Educated  Patient    Methods  Explanation;Demonstration;Verbal cues;Handout    Comprehension  Verbalized understanding;Returned demonstration;Verbal cues required       PT Short Term Goals - 09/29/18 1723      PT SHORT TERM GOAL #1   Title  Patient will be independent with HEP to address decr balance and LE weakness (Target for all STGs 10/27/2018)    Time  4    Period  Weeks    Status  New      PT SHORT TERM GOAL #2   Title  Patient will demonstrate reduced fall risk with TUG <15 seconds with LRAD    Baseline  17.25 sec    Time  4    Period  Weeks    Status  New      PT SHORT TERM GOAL #3   Title  Patient will demonstrate reduced fall risk with 5xSTS <16 sec    Baseline  19.03 sec    Time  4    Period  Weeks    Status  New      PT SHORT TERM GOAL #4   Title  Patient will complete home assessment (based on provided education) to reduce fall risk related to home environment.     Time  4    Period  Weeks    Status  New      PT SHORT TERM GOAL #5   Title  Patient will complete Berg Balance assessment by 4th week for further delineation of appropriate assistive device and fall risk    Time  4    Period  Weeks    Status  New        PT Long Term Goals - 09/29/18 1734      PT LONG TERM GOAL #1   Title  Patient will be independent with  updated HEP (  Target for all LTGs 11/26/2018)    Time  8    Period  Weeks    Status  New    Target Date  11/26/18      PT LONG TERM GOAL #2   Title  Demonstrate decr fall risk with TUG <13.5 sec with LRAD    Time  8    Period  Weeks    Status  New      PT LONG TERM GOAL #3   Title  Demonstrate decr fall risk with 5xSTS <12.0 sec    Time  8    Period  Weeks    Status  New      PT LONG TERM GOAL #4   Title  Demonstrate lesser fall risk with improving Berg Balance score by 5 points compared to baseline    Time  8    Period  Weeks    Status  New            Plan - 10/18/18 1941    Clinical Impression Statement  Skilled session focused on improving bil hip stability in single limb stance (pt stands <5 sec on either leg, indicative of high fall risk). Gait training with cues to focus rolling off right 1st toe to minimize supination and recurrent ankle rolls. Balance training to elicit ankle vs hip vs stepping strategies. Patient reports he can already tell his balance has improved when walking on job sites with uneven terrain. He feels much more steady and confident.     Rehab Potential  Good    Clinical Impairments Affecting Rehab Potential  decr sensation bil feet; decr use of bil UEs due to prior cervical cord compression    PT Frequency  2x / week    PT Duration  8 weeks    PT Treatment/Interventions  ADLs/Self Care Home Management;Aquatic Therapy;Gait training;DME Instruction;Stair training;Functional mobility training;Therapeutic activities;Therapeutic exercise;Balance training;Neuromuscular re-education;Patient/family education;Orthotic Fit/Training;Manual techniques;Passive range of motion;Vestibular    PT Next Visit Plan  *Anticipate may not need 8 weeks. continue with gait training with foot up brace on outdoor surfaces; try ant/post wall bumps? balance activities on complaint surfaces with vision    Consulted and Agree with Plan of Care  Patient;Family member/caregiver     Family Member Consulted  wife, [name]       Patient will benefit from skilled therapeutic intervention in order to improve the following deficits and impairments:  Abnormal gait, Cardiopulmonary status limiting activity, Decreased balance, Decreased mobility, Decreased knowledge of use of DME, Decreased knowledge of precautions, Decreased endurance, Decreased strength, Decreased range of motion, Impaired sensation, Impaired UE functional use  Visit Diagnosis: Unsteadiness on feet  Other abnormalities of gait and mobility     Problem List Patient Active Problem List   Diagnosis Date Noted  . Syncope 04/27/2018  . Hypoglycemia due to insulin 01/06/2018  . Essential hypertension 01/06/2018  . Diabetes mellitus type 2, with complication, on long term insulin pump ([place]) 01/06/2018  . Syncope and collapse 01/05/2018  . Encounter for therapeutic drug monitoring 06/29/2017  . Obstructive sleep apnea 05/05/2017  . Cancer ([place])   . 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[place]) 01/05/2017  . Abnormal chest x-ray 10/12/2016  . Benign neoplasm of colon 01/14/2013    Rexanne Mano, PT 10/18/2018, 7:49 PM  [facility] [address] [place], [place], [address] Phone: [phone]   Fax:  [phone]  Name: Shane [name] MRN: [id number] Date of Birth: 1941/11/30

## 2018-10-18 NOTE — Patient Instructions (Addendum)
Access Code: [email or web address]  URL: [email or web address]  Date: 10/18/2018  Prepared by: Barry Brunner   Exercises  Corner with eyes closed - 3 reps - 1 sets - 1x daily - 7x weekly  Romberg Stance with Head Nods - 10 reps - 1 sets - 1x daily - 7x weekly  Tandem Walking with Counter Support - 5 reps - 1 sets - 1x daily - 7x weekly  Heel rises with counter support - 10 reps - 1 sets - 3 seconds hold - 1x daily - 7x weekly  Toe Raises with Counter Support - 10 reps - 1 sets - 1x daily - 7x weekly  seated ankle eversion - 10 reps - 1 sets - - hold - 1-3x daily - 5x weekly   ADDED Standing Single Leg Stance with Counter Support - 3 reps - 1 sets - 10 seconds hold - 1x daily - 7x weekly  Patient Education  Check for Safety  What You Can Do to Prevent Falls

## 2018-10-23 ENCOUNTER — Ambulatory Visit: Payer: Medicare Other | Admitting: Physical Therapy

## 2018-10-23 ENCOUNTER — Encounter (HOSPITAL_COMMUNITY)
Admission: RE | Admit: 2018-10-23 | Discharge: 2018-10-23 | Disposition: A | Discharge status: Home / Self Care | Payer: Self-pay | Source: Ambulatory Visit | Attending: Internal Medicine | Admitting: Internal Medicine

## 2018-10-23 DIAGNOSIS — R2681 Unsteadiness on feet: Secondary | ICD-10-CM | POA: Diagnosis not present

## 2018-10-23 DIAGNOSIS — R2689 Other abnormalities of gait and mobility: Secondary | ICD-10-CM

## 2018-10-23 NOTE — Therapy (Signed)
[facility] [address] [place], [place], [address] Phone: [phone]   Fax:  [phone]  Physical Therapy Treatment  Patient Details  Name: Shane [name] MRN: [id number] Date of Birth: 12-16-1940 Referring Provider (PT): Burnard Bunting, MD   Encounter Date: 10/23/2018  PT End of Session - 10/23/18 0852    Visit Number  6    Number of Visits  17    Date for PT Re-Evaluation  11/26/18    Authorization Type  [employer]HC Medicare    Authorization Time Period  09/27/18 to 12/26/2018    PT Start Time  0802    PT Stop Time  0844    PT Time Calculation (min)  42 min    Equipment Utilized During Treatment  Gait belt    Activity Tolerance  Patient tolerated treatment well    Behavior During Therapy  [facility] for tasks assessed/performed       Past Medical History:  Diagnosis Date  . 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 Cataract   . Chest pain   . Chronic kidney disease    d/t DM  . Diabetes mellitus    Vgo disposal insulin bolus  simular to insulin pump  . Dyspnea   . GERD (gastroesophageal reflux disease)   . Hyperlipidemia   . Hypertension   . Idiopathic pulmonary fibrosis ([place]) 11/2016  . Neuromuscular disorder ([place])   . PONV (postoperative nausea and vomiting)   . Sleep apnea     uses cpap asked to bring mask and tubing    Past Surgical History:  Procedure Laterality Date  . ANTERIOR FUSION CERVICAL SPINE  2012  . CARDIAC CATHETERIZATION  2011  . CARDIAC CATHETERIZATION N/A 11/09/2016   Procedure: Right Heart Cath;  Surgeon: Belva Crome, MD;  Location: [facility];  Service: Cardiovascular;  Laterality: N/A;  . carpel tunnel     left wrist  . CATARACT EXTRACTION W/ INTRAOCULAR LENS  IMPLANT, BILATERAL  2013  . CERVICAL LAMINECTOMY  2012  . COLONOSCOPY N/A 01/14/2013   Procedure: COLONOSCOPY;  Surgeon: Irene Shipper, MD;  [facility];  Service: Endoscopy;  Laterality: N/A;  . EYE SURGERY    . [place]   left  . LUMBAR LAMINECTOMY  2003  . LUNG BIOPSY Left 12/26/2016   Procedure: LUNG BIOPSY;  Surgeon: [name], MD;  Location: [facility];  Service: Thoracic;  Laterality: Left;  . POSTERIOR FUSION CERVICAL SPINE  2012  . TRIGGER FINGER RELEASE  2011   4th finger left hand  . VIDEO ASSISTED THORACOSCOPY Left 12/26/2016   Procedure: VIDEO ASSISTED THORACOSCOPY;  Surgeon: [name], MD;  Location: [facility] [place];  Service: Thoracic;  Laterality: Left;  [place] Kitchen VIDEO BRONCHOSCOPY N/A 12/26/2016   Procedure: VIDEO BRONCHOSCOPY;  Surgeon: [name], MD;  Location: [facility];  Service: Thoracic;  Laterality: N/A;    There were no vitals filed for this visit.  Subjective Assessment - 10/23/18 0805    Subjective  No issues and no falls. He feels the braces are helping. It is hard to snap the braces on.    Patient Stated Goals  Be able to walk without wobbling and not fall    Currently in Pain?  No/denies                       [facility] Adult PT Treatment/Exercise - 10/23/18 0240  Transfers   Transfers  Sit to Stand;Stand to Sit    Sit to Stand  5: Supervision    Stand to Sit  5: Supervision    Number of Reps  Other reps (comment)   5   Comments  Pt required use of UE during first 2 reps and progressed to transfer without UE assist. Pt was supervision due to mild instability during standing static balance.      Ambulation/Gait   Ambulation/Gait Assistance  5: Supervision    Ambulation/Gait Assistance Details  Pt was supervision due to occassional swaying during gait. Pt presented with ankle instability.    Ambulation Distance (Feet)  230 Feet   [phone]   Assistive device  Straight cane    Gait Pattern  Step-through pattern;Wide base of support;Decreased dorsiflexion - right;Poor foot clearance - right;Decreased stride length    Ambulation Surface  Level;Indoor    Gait Comments  Pt performed gait spontaneous stops; ambiulation while scanning for objects  in the room. Pt became fatigued during ambulation and required seated rest break.      High Level Balance   High Level Balance Activities  Other (comment)    High Level Balance Comments  In // bars: pt perofrmed tandem walking x 5 laps; stepping strategy while on compliant surface fwd/retro BIL x10 each; steps ups to 4' board fwd/latera/ x10 each; static standing balance while on compliant surface progressing to horozontal/ vertical head turns; Rockerboard anterior/posterior/lateral' for 30 sec each. Pt required intermittent use of UE to maintain balance.          PT Short Term Goals - 09/29/18 1723      PT SHORT TERM GOAL #1   Title  Patient will be independent with HEP to address decr balance and LE weakness (Target for all STGs 10/27/2018)    Time  4    Period  Weeks    Status  New      PT SHORT TERM GOAL #2   Title  Patient will demonstrate reduced fall risk with TUG <15 seconds with LRAD    Baseline  17.25 sec    Time  4    Period  Weeks    Status  New      PT SHORT TERM GOAL #3   Title  Patient will demonstrate reduced fall risk with 5xSTS <16 sec    Baseline  19.03 sec    Time  4    Period  Weeks    Status  New      PT SHORT TERM GOAL #4   Title  Patient will complete home assessment (based on provided education) to reduce fall risk related to home environment.     Time  4    Period  Weeks    Status  New      PT SHORT TERM GOAL #5   Title  Patient will complete Berg Balance assessment by 4th week for further delineation of appropriate assistive device and fall risk    Time  4    Period  Weeks    Status  New        PT Long Term Goals - 09/29/18 1734      PT LONG TERM GOAL #1   Title  Patient will be independent with updated HEP (Target for all LTGs 11/26/2018)    Time  8    Period  Weeks    Status  New    Target Date  11/26/18  PT LONG TERM GOAL #2   Title  Demonstrate decr fall risk with TUG <13.5 sec with LRAD    Time  8    Period  Weeks     Status  New      PT LONG TERM GOAL #3   Title  Demonstrate decr fall risk with 5xSTS <12.0 sec    Time  8    Period  Weeks    Status  New      PT LONG TERM GOAL #4   Title  Demonstrate lesser fall risk with improving Berg Balance score by 5 points compared to baseline    Time  8    Period  Weeks    Status  New            Plan - 10/23/18 0853    Clinical Impression Statement  Todays treatment continued to focus on high level balance actviites and challenging gait. Pt demonstrated minimal ankle rolling and reports improvement with braces. Pt would benefit from further physcial therapy to improve gait and balance deficits.    Clinical Presentation  Unstable    Clinical Decision Making  Moderate    Rehab Potential  Good    Clinical Impairments Affecting Rehab Potential  decr sensation bil feet; decr use of bil UEs due to prior cervical cord compression    PT Frequency  2x / week    PT Duration  8 weeks    PT Treatment/Interventions  ADLs/Self Care Home Management;Aquatic Therapy;Gait training;DME Instruction;Stair training;Functional mobility training;Therapeutic activities;Therapeutic exercise;Balance training;Neuromuscular re-education;Patient/family education;Orthotic Fit/Training;Manual techniques;Passive range of motion;Vestibular    PT Next Visit Plan  Anticipate may not need 8 weeks. continue with gait training with foot up braces on outdoor surfaces; try ant/post wall bumps? balance activities on complaint surfaces with vision    Consulted and Agree with Plan of Care  Patient       Patient will benefit from skilled therapeutic intervention in order to improve the following deficits and impairments:  Abnormal gait, Cardiopulmonary status limiting activity, Decreased balance, Decreased mobility, Decreased knowledge of use of DME, Decreased knowledge of precautions, Decreased endurance, Decreased strength, Decreased range of motion, Impaired sensation, Impaired UE functional  use  Visit Diagnosis: Other abnormalities of gait and mobility  Unsteadiness on feet     Problem List Patient Active Problem List   Diagnosis Date Noted  . Syncope 04/27/2018  . Hypoglycemia due to insulin 01/06/2018  . Essential hypertension 01/06/2018  . Diabetes mellitus type 2, with complication, on long term insulin pump ([place]) 01/06/2018  . Syncope and collapse 01/05/2018  . Encounter for therapeutic drug monitoring 06/29/2017  . Obstructive sleep apnea 05/05/2017  . Cancer ([place])   . 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[place]) 01/05/2017  . Abnormal chest x-ray 10/12/2016  . Benign neoplasm of colon 01/14/2013    Donnald Garre SPTA 10/23/2018, 8:56 AM  [facility] [address] [place], [place], [address] Phone: [phone]   Fax:  [phone]  Name: Shane [name] MRN: [id number] Date of Birth: [birth year]

## 2018-10-25 ENCOUNTER — Encounter (HOSPITAL_COMMUNITY)
Admission: RE | Admit: 2018-10-25 | Discharge: 2018-10-25 | Disposition: A | Discharge status: Home / Self Care | Payer: Self-pay | Source: Ambulatory Visit | Attending: Internal Medicine | Admitting: Internal Medicine

## 2018-10-25 ENCOUNTER — Ambulatory Visit: Payer: Medicare Other | Admitting: Physical Therapy

## 2018-10-25 DIAGNOSIS — M6281 Muscle weakness (generalized): Secondary | ICD-10-CM

## 2018-10-25 DIAGNOSIS — R2681 Unsteadiness on feet: Secondary | ICD-10-CM

## 2018-10-25 NOTE — Therapy (Signed)
[facility] [address] [place], [place], [address] Phone: [phone]   Fax:  [phone]  Physical Therapy Treatment  Patient Details  Name: Shane [name] MRN: [id number] Date of Birth: Feb 04, 1941 Referring Provider (PT): Burnard Bunting, MD   Encounter Date: 10/25/2018  PT End of Session - 10/25/18 0857    Visit Number  7    Number of Visits  17    Date for PT Re-Evaluation  11/26/18    Authorization Type  UHC Medicare    Authorization Time Period  09/27/18 to 12/26/2018    PT Start Time  0802    PT Stop Time  0844    PT Time Calculation (min)  42 min    Equipment Utilized During Treatment  Gait belt    Activity Tolerance  Patient tolerated treatment well    Behavior During Therapy  [facility] for tasks assessed/performed       Past Medical History:  Diagnosis Date  . Cancer ([place])    ling  . Cataract   . Chest pain   . Chronic kidney disease    d/t DM  . Diabetes mellitus    Vgo disposal insulin bolus  simular to insulin pump  . Dyspnea   . GERD (gastroesophageal reflux disease)   . Hyperlipidemia   . Hypertension   . Idiopathic pulmonary fibrosis ([place]) 11/2016  . Neuromuscular disorder ([place])   . PONV (postoperative nausea and vomiting)   . Sleep apnea     uses cpap asked to bring mask and tubing    Past Surgical History:  Procedure Laterality Date  . ANTERIOR FUSION CERVICAL SPINE  2012  . CARDIAC CATHETERIZATION  2011  . CARDIAC CATHETERIZATION N/A 11/09/2016   Procedure: Right Heart Cath;  Surgeon: Belva Crome, MD;  Location: [facility];  Service: Cardiovascular;  Laterality: N/A;  . carpel tunnel     left wrist  . CATARACT EXTRACTION W/ INTRAOCULAR LENS  IMPLANT, BILATERAL  2013  . CERVICAL LAMINECTOMY  2012  . COLONOSCOPY N/A 01/14/2013   Procedure: COLONOSCOPY;  Surgeon: Irene Shipper, MD;  [facility];  Service: Endoscopy;  Laterality: N/A;  . EYE SURGERY    . [place]   left  . LUMBAR LAMINECTOMY  2003  . LUNG BIOPSY Left 12/26/2016   Procedure: LUNG BIOPSY;  Surgeon: [name], MD;  Location: [facility];  Service: Thoracic;  Laterality: Left;  . POSTERIOR FUSION CERVICAL SPINE  2012  . TRIGGER FINGER RELEASE  2011   4th finger left hand  . VIDEO ASSISTED THORACOSCOPY Left 12/26/2016   Procedure: VIDEO ASSISTED THORACOSCOPY;  Surgeon: [name], MD;  Location: [facility];  Service: Thoracic;  Laterality: Left;  [place] Kitchen VIDEO BRONCHOSCOPY N/A 12/26/2016   Procedure: VIDEO BRONCHOSCOPY;  Surgeon: [name], MD;  Location: [facility];  Service: Thoracic;  Laterality: N/A;    There were no vitals filed for this visit.  Subjective Assessment - 10/25/18 0808    Subjective  I have not had any falls or changes. I woke up a little stiff yesterday.     Pertinent History  interstitial lung dz,, DM, arthralgia, DM, HTN, HLD, ACDF 2012 required emergent cervical laminectomy several days after ACDF due to hematoma and cord compresssion    Patient Stated Goals  Be able to walk without wobbling and not fall    Currently in Pain?  No/denies  [facility] Adult PT Treatment/Exercise - 10/25/18 0850      Ambulation/Gait   Ambulation/Gait Assistance  5: Supervision    Ambulation/Gait Assistance Details  Pt presents with mild unsteadiness while ambulating through clinic.    Ambulation Distance (Feet)  100 Feet    Assistive device  Straight cane    Ambulation Surface  Level;Indoor    Gait Comments  Ambulation through clinic and unsteadiness increased towards end of treatment due to fatigue.      High Level Balance   High Level Balance Activities  Other (comment)    High Level Balance Comments  In // bars: Pt performed tandem walking with light UE use x 5 laps; Pt performed static standing balance on compliance surface progressing to head turns for 30 sec each; Performed stepping strategy while standing on complaint surface fwd//retro x 10  each BLE; Pt performed step ups fwd while standing on compliant surface x 10 each; Pt performed lateral step ups while on level surface x 10 each; Pt performed rockerboard exercises anterior/posterior/lateral x 30 sec each; Pt performed cone taps while standing on level surface x 10 each. Pt required light use of UE throughout exercises with VCs to attempt decreasing UE use. Verbal cues were given for posture and technique.               PT Short Term Goals - 09/29/18 1723      PT SHORT TERM GOAL #1   Title  Patient will be independent with HEP to address decr balance and LE weakness (Target for all STGs 10/27/2018)    Time  4    Period  Weeks    Status  New      PT SHORT TERM GOAL #2   Title  Patient will demonstrate reduced fall risk with TUG <15 seconds with LRAD    Baseline  17.25 sec    Time  4    Period  Weeks    Status  New      PT SHORT TERM GOAL #3   Title  Patient will demonstrate reduced fall risk with 5xSTS <16 sec    Baseline  19.03 sec    Time  4    Period  Weeks    Status  New      PT SHORT TERM GOAL #4   Title  Patient will complete home assessment (based on provided education) to reduce fall risk related to home environment.     Time  4    Period  Weeks    Status  New      PT SHORT TERM GOAL #5   Title  Patient will complete Berg Balance assessment by 4th week for further delineation of appropriate assistive device and fall risk    Time  4    Period  Weeks    Status  New        PT Long Term Goals - 09/29/18 1734      PT LONG TERM GOAL #1   Title  Patient will be independent with updated HEP (Target for all LTGs 11/26/2018)    Time  8    Period  Weeks    Status  New    Target Date  11/26/18      PT LONG TERM GOAL #2   Title  Demonstrate decr fall risk with TUG <13.5 sec with LRAD    Time  8    Period  Weeks    Status  New  PT LONG TERM GOAL #3   Title  Demonstrate decr fall risk with 5xSTS <12.0 sec    Time  8    Period  Weeks     Status  New      PT LONG TERM GOAL #4   Title  Demonstrate lesser fall risk with improving Berg Balance score by 5 points compared to baseline    Time  8    Period  Weeks    Status  New            Plan - 10/25/18 0857    Clinical Impression Statement  Treatment today focused on high level balance activities while limiting use of UE. Pt demonstrates decreased neuromuscular control towards end of treatment likely due to fatigue. Pt would benefit form further physcial therapy to increase balance and activity tolerance.    Clinical Presentation  Unstable    Clinical Decision Making  Moderate    Rehab Potential  Good    Clinical Impairments Affecting Rehab Potential  decr sensation bil feet; decr use of bil UEs due to prior cervical cord compression    PT Frequency  2x / week    PT Duration  8 weeks    PT Next Visit Plan  *Anticipate may not need 8 weeks. continue with gait training with foot up brace on outdoor surfaces; try ant/post wall bumps? balance activities on complaint surfaces with vision    Consulted and Agree with Plan of Care  Patient       Patient will benefit from skilled therapeutic intervention in order to improve the following deficits and impairments:  Abnormal gait, Cardiopulmonary status limiting activity, Decreased balance, Decreased mobility, Decreased knowledge of use of DME, Decreased knowledge of precautions, Decreased endurance, Decreased strength, Decreased range of motion, Impaired sensation, Impaired UE functional use  Visit Diagnosis: Unsteadiness on feet  Muscle weakness (generalized)     Problem List Patient Active Problem List   Diagnosis Date Noted  . Syncope 04/27/2018  . Hypoglycemia due to insulin 01/06/2018  . Essential hypertension 01/06/2018  . Diabetes mellitus type 2, with complication, on long term insulin pump ([place]) 01/06/2018  . Syncope and collapse 01/05/2018  . Encounter for therapeutic drug monitoring 06/29/2017  . Obstructive  sleep apnea 05/05/2017  . Cancer ([place])   . 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[place]) 01/05/2017  . Abnormal chest x-ray 10/12/2016  . Benign neoplasm of colon 01/14/2013    Nicki Reaper Rodgman SPTA 10/25/2018, 9:00 AM  [facility] [address] [place], [place], [address] Phone: [phone]   Fax:  [phone]  Name: Shane [name] MRN: [id number] Date of Birth: 08/17/1941

## 2018-10-29 ENCOUNTER — Ambulatory Visit: Payer: Medicare Other | Admitting: Physical Therapy

## 2018-10-30 ENCOUNTER — Encounter (HOSPITAL_COMMUNITY): Payer: Self-pay

## 2018-10-31 ENCOUNTER — Encounter (HOSPITAL_COMMUNITY)
Admission: RE | Admit: 2018-10-31 | Discharge: 2018-10-31 | Disposition: A | Discharge status: Home / Self Care | Payer: Self-pay | Source: Ambulatory Visit | Attending: Internal Medicine | Admitting: Internal Medicine

## 2018-10-31 ENCOUNTER — Encounter: Payer: Medicare Other | Admitting: *Deleted

## 2018-10-31 ENCOUNTER — Ambulatory Visit: Payer: Medicare Other | Admitting: Physical Therapy

## 2018-10-31 DIAGNOSIS — Z006 Encounter for examination for normal comparison and control in clinical research program: Secondary | ICD-10-CM

## 2018-10-31 DIAGNOSIS — J84112 Idiopathic pulmonary fibrosis: Secondary | ICD-10-CM

## 2018-10-31 MED ORDER — STUDY - FIBROGEN - PAMREVLUMAB OR PLACEBO 10 MG/ML IV INFUSION (PI-RAMASWAMY)
30.0000 mg/kg | Freq: Once | INTRAVENOUS | Status: AC
  Administered 2018-10-31: 3690 mg via INTRAVENOUS
  Filled 2018-10-31: qty 369

## 2018-10-31 NOTE — Research (Addendum)
Title: FGCL-3019-091 (Fibro[employer] Study) is a Phase 3, randomized, double-blind, placebo-controlled multicenter international study to evaluate evaluate the efficacy and safety of 30 mg/kg IV infusions of pamrevlumab administered every 3 weeks for 52 weeks as compared to placebo in subjects with Idiopathic Pulmonary Fibrosis. Primary end point is: change in FVC from baseline at week 52  Protocol #: FGCL-[id number], Clinical Trials #: [id number] Sponsor: www.fibrogen.com  ([place], [place], [place])  Protocol Version for 10/31/2018 date of Amendment 2.0 [id number] Consent Version for 10/31/2018 date of [facility] IRB version [id number] Investigator Brochure Version for 10/31/2018 date of v17 25OCT2018   Key Features of Pamrevlumab (FG-3019) the study drug: a recombinant fully human IgG kappa monoclonal antibody that binds to CTGF and is being developed for treatment of diseases in which tissue fibrosis has a major pathogenic role. In particular, pamrevlumab appears to disrupt a CTGF autocrine loop in mesenchymal cells like myofibroblasts that reduces their recruitment of leukocytes like macrophages, mast and dendritic cells via chemokine secretion. This disruption results in collapse of the cellular crosstalk that drives tissue remodeling.  Clinical Research Coordinator note : This visit for Subject Shane [name] with DOB: 1941/02/24 on 10/31/2018 for the above protocol is Visit/Encounter Day 1 and is for purpose of Research . The consent for this encounter is under Protocol Version Amendment 2.0 and  is currently IRB approved. subject expressed continued interest and consent in continuing as a study subject. Subject confirmed that there was no change in contact information (e.g. address, telephone, email). Subject thanked for participation in research and contribution to science.   In this visit 10/31/2018 the subject will be evaluated by investigator named Dr. Chase Caller and Dr. Asencion Noble. Both physicians  reviewed subject against inclusion and exclusion criteria. Prior to any study related procedures conducted today. The subject was re-consented to [facility] IRB version [phone]. Subject signed consent at 08:24 Dr Chase Caller was present during the entire reconsenting process. This research coordinator has verified that Both investigators are up to date with their training. All pre infusion study related activities were completed as per the above referenced protocol. Study Drug was prepared by Alveria Apley CPhT. Cyril Mourning R. was the attending nurse throughout the duration of the study visit. IP was started at 10:14 and concluded at 12:20. Dr. Joya Gaskins Sub-I, was present in the subject treatment room for the initiation of the drug infusion and remind at bedside for the approximately 15 minutes. Dr. Joya Gaskins remained immediately available throughout the course of therapy administration. The subject underwent all post infusion assessments as per above stated protocol. Subject was discharged without any reported complications after post infusion 1 hour observation period.   1. Because this visit is a key visit of randomization this visit is under direct supervision of the PI Dr. Chase Caller, and Sub-I Dr. Joya Gaskins collaboratively  . This PI and Sub-I is available for this visit   Signed by  T. Early Chars BS, [place], [place], [place] 14:21pm 10/31/2018

## 2018-11-06 ENCOUNTER — Ambulatory Visit: Payer: Medicare Other | Attending: Internal Medicine | Admitting: Physical Therapy

## 2018-11-06 ENCOUNTER — Telehealth: Payer: Self-pay | Admitting: Internal Medicine

## 2018-11-06 ENCOUNTER — Encounter (HOSPITAL_COMMUNITY)
Admission: RE | Admit: 2018-11-06 | Discharge: 2018-11-06 | Disposition: A | Discharge status: Home / Self Care | Payer: Self-pay | Source: Ambulatory Visit | Attending: Internal Medicine | Admitting: Internal Medicine

## 2018-11-06 DIAGNOSIS — N189 Chronic kidney disease, unspecified: Secondary | ICD-10-CM | POA: Insufficient documentation

## 2018-11-06 DIAGNOSIS — M6281 Muscle weakness (generalized): Secondary | ICD-10-CM | POA: Insufficient documentation

## 2018-11-06 DIAGNOSIS — G4730 Sleep apnea, unspecified: Secondary | ICD-10-CM | POA: Insufficient documentation

## 2018-11-06 DIAGNOSIS — E1122 Type 2 diabetes mellitus with diabetic chronic kidney disease: Secondary | ICD-10-CM | POA: Insufficient documentation

## 2018-11-06 DIAGNOSIS — E785 Hyperlipidemia, unspecified: Secondary | ICD-10-CM | POA: Insufficient documentation

## 2018-11-06 DIAGNOSIS — Z794 Long term (current) use of insulin: Secondary | ICD-10-CM | POA: Insufficient documentation

## 2018-11-06 DIAGNOSIS — I129 Hypertensive chronic kidney disease with stage 1 through stage 4 chronic kidney disease, or unspecified chronic kidney disease: Secondary | ICD-10-CM | POA: Insufficient documentation

## 2018-11-06 DIAGNOSIS — K219 Gastro-esophageal reflux disease without esophagitis: Secondary | ICD-10-CM | POA: Insufficient documentation

## 2018-11-06 DIAGNOSIS — R2681 Unsteadiness on feet: Secondary | ICD-10-CM | POA: Diagnosis not present

## 2018-11-06 DIAGNOSIS — J84112 Idiopathic pulmonary fibrosis: Secondary | ICD-10-CM | POA: Insufficient documentation

## 2018-11-06 DIAGNOSIS — Z7982 Long term (current) use of aspirin: Secondary | ICD-10-CM | POA: Insufficient documentation

## 2018-11-06 DIAGNOSIS — R2689 Other abnormalities of gait and mobility: Secondary | ICD-10-CM | POA: Insufficient documentation

## 2018-11-06 DIAGNOSIS — Z79899 Other long term (current) drug therapy: Secondary | ICD-10-CM | POA: Insufficient documentation

## 2018-11-06 NOTE — Telephone Encounter (Signed)
Today I received a phone call from [name], spouse of mr. Shane [name] Jun 14, 1941. Mr. Shane [name] began Treatment on the Fibrogen clinical trial and was recently randomized as our second subject [id number] on Wednesday 27NOV2019. That subject completed Day 1 treatment without complications. Today [id number] the spouse of the subject stated that beginning Saturday 30NOV2019 the subject noticed a Dime sized rash on the left side of the neck which has since increased in size as of today [id number] to just under the circumference of a coffee mug. The subject reports that it indeed itches but reports no obstruction in airflow or swelling of the tongue or throat. Only treatment thus far as reported by the subject was nightly applications of Aveeno brand oatmeal based lotion. Based on Investigator Brochure review by Doctor Chase Caller and Early Chars and patient reported information it is the opinion of Dr. Chase Caller that this is mild in severity but a  definite relation to study drug.  The occurrence of rash (mild severity less than or equal to grade 2) in subjects on previous studies is between 4 and 9% in treatment of IPF. Additionally the percentage of Rash (severe greater or equal to grade 3) drops to 0%.   Taken into account all the information known to date. It is the recommendation of Dr. Chase Caller that treatment at this juncture be observation of the area, and the subject can continue with nightly applications of the oatmeal based lotion. The patient was instructed to call if any changes in status. Patient verbalized understanding.  The study team has reached out to the medical monitor with [employer] for further guidance.

## 2018-11-06 NOTE — Telephone Encounter (Signed)
Based on IB review it appears  RASH with this IP - I would say 5-9% any severity in IPF population and upto 36% in cancer population. The % for grade >/= 3 drops to 0 to 4% in IPF/Cancer population respectively. So, in my opinion this rash is a) mild; b) definitely related to study drug.  My recommendation is to continue to monitor. No change in plan. Will await word from medical monitor who CRC has written to 11/06/2018    SIGNATURE    Dr. Brand Males, M.D., F.C.C.P, ACRP-CPI Pulmonary and Critical Care Medicine Research Investigator, [facility] Staff Physician, [place] Director - Interstitial Lung Disease  Program  [facility] [place], [place], [address]  Pager: [phone], If no answer or between  15:00h - 7:00h: call [phone] Telephone: [phone]  3:25 PM 11/06/2018

## 2018-11-06 NOTE — Therapy (Signed)
[facility] [address] [place], [place], [address] Phone: [phone]   Fax:  [phone]  Physical Therapy Treatment  Patient Details  Name: Shane [name] MRN: [id number] Date of Birth: 02/08/1941 Referring Provider (PT): Burnard Bunting, MD   Encounter Date: 11/06/2018  PT End of Session - 11/06/18 0949    Visit Number  8    Number of Visits  17    Date for PT Re-Evaluation  11/26/18    Authorization Type  [employer]HC Medicare    Authorization Time Period  09/27/18 to 12/26/2018    PT Start Time  0803    PT Stop Time  0845    PT Time Calculation (min)  42 min    Equipment Utilized During Treatment  Gait belt    Activity Tolerance  Patient tolerated treatment well    Behavior During Therapy  [facility] for tasks assessed/performed       Past Medical History:  Diagnosis Date  . Cancer ([place])    ling  . Cataract   . Chest pain   . Chronic kidney disease    d/t DM  . Diabetes mellitus    Vgo disposal insulin bolus  simular to insulin pump  . Dyspnea   . GERD (gastroesophageal reflux disease)   . Hyperlipidemia   . Hypertension   . 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 Neuromuscular disorder ([place])   . PONV (postoperative nausea and vomiting)   . Sleep apnea     uses cpap asked to bring mask and tubing    Past Surgical History:  Procedure Laterality Date  . ANTERIOR FUSION CERVICAL SPINE  2012  . CARDIAC CATHETERIZATION  2011  . CARDIAC CATHETERIZATION N/A 11/09/2016   Procedure: Right Heart Cath;  Surgeon: Belva Crome, MD;  Location: [facility];  Service: Cardiovascular;  Laterality: N/A;  . carpel tunnel     left wrist  . CATARACT EXTRACTION W/ INTRAOCULAR LENS  IMPLANT, BILATERAL  2013  . CERVICAL LAMINECTOMY  2012  . COLONOSCOPY N/A 01/14/2013   Procedure: COLONOSCOPY;  Surgeon: Irene Shipper, MD;  [facility];  Service: Endoscopy;  Laterality: N/A;  . EYE SURGERY    . [place]   left  . LUMBAR LAMINECTOMY  2003  . LUNG BIOPSY Left 12/26/2016   Procedure: LUNG BIOPSY;  Surgeon: [name], MD;  Location: [facility];  Service: Thoracic;  Laterality: Left;  . POSTERIOR FUSION CERVICAL SPINE  2012  . TRIGGER FINGER RELEASE  2011   4th finger left hand  . VIDEO ASSISTED THORACOSCOPY Left 12/26/2016   Procedure: VIDEO ASSISTED THORACOSCOPY;  Surgeon: [name], MD;  Location: [facility];  Service: Thoracic;  Laterality: Left;  [place] Kitchen VIDEO BRONCHOSCOPY N/A 12/26/2016   Procedure: VIDEO BRONCHOSCOPY;  Surgeon: [name], MD;  Location: [facility];  Service: Thoracic;  Laterality: N/A;    There were no vitals filed for this visit.  Subjective Assessment - 11/06/18 0806    Subjective  Pt had an infusion that caused some side effects such as blurred vision (pt is part of an experimental group for pulmonology). He is experiencing a little discomfort still today. He has not had any falls since last visit.    Pertinent History  interstitial lung dz,, DM, arthralgia, DM, HTN, HLD, ACDF 2012 required emergent cervical laminectomy several days after ACDF due to hematoma and cord compresssion    Patient Stated Goals  Be  able to walk without wobbling and not fall    Currently in Pain?  No/denies         [facility] PT Assessment - 11/06/18 0001      Berg Balance Test   Sit to Stand  Able to stand without using hands and stabilize independently    Standing Unsupported  Able to stand 2 minutes with supervision    Sitting with Back Unsupported but Feet Supported on Floor or Stool  Able to sit safely and securely 2 minutes    Stand to Sit  Sits safely with minimal use of hands    Transfers  Able to transfer safely, minor use of hands    Standing Unsupported with Eyes Closed  Able to stand 10 seconds with supervision    Standing Ubsupported with Feet Together  Able to place feet together independently and stand for 1 minute with supervision    From Standing, Reach  Forward with Outstretched Arm  Can reach forward >12 cm safely (5")    From Standing Position, Pick up Object from Floor  Unable to pick up shoe, but reaches 2-5 cm (1-2") from shoe and balances independently    From Standing Position, Turn to Look Behind Over each Shoulder  Turn sideways only but maintains balance    Turn 360 Degrees  Needs close supervision or verbal cueing    Standing Unsupported, Alternately Place Feet on Step/Stool  Able to complete 4 steps without aid or supervision    Standing Unsupported, One Foot in [place] to take small step independently and hold 30 seconds    Standing on One Leg  Tries to lift leg/unable to hold 3 seconds but remains standing independently    Total Score  38           [facility] Adult PT Treatment/Exercise - 11/06/18 4696      Ambulation/Gait   Ambulation/Gait Assistance  5: Supervision;4: Min assist    Ambulation/Gait Assistance Details  Presented with mild unsteadiness during ambulation.    Ambulation Distance (Feet)  230 Feet    Assistive device  Straight cane    Gait Pattern  Step-through pattern;Wide base of support;Decreased dorsiflexion - right;Poor foot clearance - right;Decreased stride length    Ambulation Surface  Level;Indoor    Gait Comments  Pt presented with mild unsteadiness during gait and experienced 2x LOB requiring min assist to regain balance.      High Level Balance   High Level Balance Activities  Other (comment)    High Level Balance Comments  In // bars: Pt performed stepping strategy while standing on compliant surface BIL in FWD/retro direction x10 each. Pt also performed step ups on 6" step from level surface x10 each. Pt required intermittent use of BUE to maintain balance.        PT Short Term Goals - 11/06/18 0827      PT SHORT TERM GOAL #1   Title  Patient will be independent with HEP to address decr balance and LE weakness (Target for all STGs 10/27/2018)    Time  4    Period  Weeks    Status  On-going       PT SHORT TERM GOAL #2   Title  Patient will demonstrate reduced fall risk with TUG <15 seconds with LRAD    Baseline  17.25 sec    Time  4    Period  Weeks    Status  On-going      PT SHORT  TERM GOAL #3   Title  Patient will demonstrate reduced fall risk with 5xSTS <16 sec    Baseline  19.03 sec    Time  4    Period  Weeks    Status  On-going      PT SHORT TERM GOAL #4   Title  Patient will complete home assessment (based on provided education) to reduce fall risk related to home environment.     Period  Weeks    Status  On-going      PT SHORT TERM GOAL #5   Title  Patient will complete Berg Balance assessment by 4th week for further delineation of appropriate assistive device and fall risk    Baseline  38/56 11/06/2018 as baseline; PT to set goals as indicated.    Time  --    Period  --    Status  Achieved          PT Long Term Goals - 09/29/18 1734      PT LONG TERM GOAL #1   Title  Patient will be independent with updated HEP (Target for all LTGs 11/26/2018)    Time  8    Period  Weeks    Status  New    Target Date  11/26/18      PT LONG TERM GOAL #2   Title  Demonstrate decr fall risk with TUG <13.5 sec with LRAD    Time  8    Period  Weeks    Status  New      PT LONG TERM GOAL #3   Title  Demonstrate decr fall risk with 5xSTS <12.0 sec    Time  8    Period  Weeks    Status  New      PT LONG TERM GOAL #4   Title  Demonstrate lesser fall risk with improving Berg Balance score by 5 points compared to baseline    Time  8    Period  Weeks    Status  New        Plan - 11/06/18 0950    Clinical Impression Statement  Treatment today focused on balance exercises and performing Berg for assessment. Pts berg balance score improved from 33/56 to 38/56.  He required min assist throughout berg to regain balance. Pt would benefit from further physical therapy to improve balance and activity tolerance deficits.    Clinical Presentation  Evolving    Clinical  Decision Making  Moderate    Rehab Potential  Good    Clinical Impairments Affecting Rehab Potential  decr sensation bil feet; decr use of bil UEs due to prior cervical cord compression    PT Frequency  2x / week    PT Duration  8 weeks    PT Treatment/Interventions  ADLs/Self Care Home Management;Aquatic Therapy;Gait training;DME Instruction;Stair training;Functional mobility training;Therapeutic activities;Therapeutic exercise;Balance training;Neuromuscular re-education;Patient/family education;Orthotic Fit/Training;Manual techniques;Passive range of motion;Vestibular    PT Next Visit Plan  assess STGs (sorry missed that they were due); Balance and activity tolerance trainging.    Consulted and Agree with Plan of Care  Patient             Patient will benefit from skilled therapeutic intervention in order to improve the following deficits and impairments:  Abnormal gait, Cardiopulmonary status limiting activity, Decreased balance, Decreased mobility, Decreased knowledge of use of DME, Decreased knowledge of precautions, Decreased endurance, Decreased strength, Decreased range of motion, Impaired sensation, Impaired UE functional use  Visit Diagnosis: Unsteadiness  on feet  Muscle weakness (generalized)     Problem List Patient Active Problem List   Diagnosis Date Noted  . Syncope 04/27/2018  . Hypoglycemia due to insulin 01/06/2018  . Essential hypertension 01/06/2018  . Diabetes mellitus type 2, with complication, on long term insulin pump ([place]) 01/06/2018  . Syncope and collapse 01/05/2018  . Encounter for therapeutic drug monitoring 06/29/2017  . Obstructive sleep apnea 05/05/2017  . 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[place]) 01/05/2017  . Abnormal chest x-ray 10/12/2016  . Benign neoplasm of colon 01/14/2013    Donnald Garre SPTA 11/06/2018, 9:55 AM  [facility] [address] [place], [place],  [address] Phone: [phone]   Fax:  [phone]  Name: Shane [name] MRN: [id number] Date of Birth: 09/02/1941

## 2018-11-08 ENCOUNTER — Encounter (HOSPITAL_COMMUNITY): Payer: Self-pay

## 2018-11-08 ENCOUNTER — Telehealth: Payer: Self-pay | Admitting: Internal Medicine

## 2018-11-08 NOTE — Telephone Encounter (Signed)
Medical monitor [name] on Nov 06, 2018 sent the following e-mail regarding rash. Based on this - Shane [name] - please call subject/his wife  1. Ask how the rash is 11/08/2018? - size, symptoms, getting wose better? Let me know via epic message and then will advise next course of action     Thanks   SIGNATURE    Dr. Brand Males, M.D., F.C.C.P, ACRP-CPI Pulmonary and [place], [facility] [place] Staff Physician, [place] Director - Interstitial Lung Disease  Program  [facility] @ [place], [place], [address]  Pager: [phone], If no answer or between  15:00h - 7:00h: call [phone] Telephone: [phone]  6:29 AM 11/08/2018     ................................................. Hi Shane [name],  Thank you for bringing this to my attention:     Question 1: I would suggest symptomatic treatment and observation; has the subject tried something  besides oatmeal lotion, such as anti-histamines?     Question 2: I would suggest continued careful observations of subsequent infusions, during study drug administration, and symptomatic treatments as needed. You should also consider frequent monitoring, including via phone, in between study visits, until this observations subsides.     I have not been involved in past studies, and am relying on the IB: skin rashes are described across various studies; however, they are not listed as "identified or potential risks" in Appendix 2, Table 61,     I hope this helps,  Lona

## 2018-11-08 NOTE — Telephone Encounter (Signed)
Thanks. We wil continue to follow expectantly. No change in plan. You can schedule next infusion visit per schedule.  Please print for my wet ink signature    SIGNATURE    Dr. Brand Males, M.D., F.C.C.P, ACRP-CPI Pulmonary and Critical Care Medicine Research Investigator, [facility] [place] Staff Physician, [place] Director - Interstitial Lung Disease  Program  [facility] @ [place], [place], [address]  Pager: [phone], If no answer or between  15:00h - 7:00h: call [phone] Telephone: [phone]  2:58 PM 11/08/2018

## 2018-11-08 NOTE — Telephone Encounter (Signed)
I spoke with Shane [name] today via phone call to inquire about an update on Shane [name]'s rash. As of today [id number] Shane [name] states that there appears to be slight improvement. She reports that the patient has had a reduction in itching and that the area has slightly reduced in size and and pigment. The patient is going to send a photo of the area for Dr. Review. The subject still continues nightly treatments of oatmeal based lotion. The patients' wife was again instructed to contact us should anything change in the status of the rash, and she verbalized understanding. The patient was thanked for her time and the call was ended.

## 2018-11-09 ENCOUNTER — Encounter: Payer: Self-pay | Admitting: Physical Therapy

## 2018-11-09 ENCOUNTER — Ambulatory Visit: Payer: Medicare Other | Admitting: Physical Therapy

## 2018-11-09 DIAGNOSIS — M6281 Muscle weakness (generalized): Secondary | ICD-10-CM

## 2018-11-09 DIAGNOSIS — R2681 Unsteadiness on feet: Secondary | ICD-10-CM

## 2018-11-09 NOTE — Therapy (Signed)
[facility] [address] [place], [place], [address] Phone: [phone]   Fax:  [phone]  Physical Therapy Treatment  Patient Details  Name: Shane [name] MRN: [id number] Date of Birth: [birth year] Referring Provider (PT): Burnard Bunting, MD   Encounter Date: 11/09/2018  PT End of Session - 11/09/18 0903    Visit Number  9    Number of Visits  17    Date for PT Re-Evaluation  11/26/18    Authorization Type  UHC Medicare    Authorization Time Period  09/27/18 to 12/26/2018    PT Start Time  0801    PT Stop Time  0845    PT Time Calculation (min)  44 min    Equipment Utilized During Treatment  Gait belt    Activity Tolerance  Patient tolerated treatment well    Behavior During Therapy  [facility] for tasks assessed/performed       Past Medical History:  Diagnosis Date  . 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 Cataract   . Chest pain   . Chronic kidney disease    d/t DM  . Diabetes mellitus    Vgo disposal insulin bolus  simular to insulin pump  . Dyspnea   . GERD (gastroesophageal reflux disease)   . Hyperlipidemia   . Hypertension   . Idiopathic pulmonary fibrosis ([place]) 11/2016  . Neuromuscular disorder ([place])   . PONV (postoperative nausea and vomiting)   . Sleep apnea     uses cpap asked to bring mask and tubing    Past Surgical History:  Procedure Laterality Date  . ANTERIOR FUSION CERVICAL SPINE  2012  . CARDIAC CATHETERIZATION  2011  . CARDIAC CATHETERIZATION N/A 11/09/2016   Procedure: Right Heart Cath;  Surgeon: Belva Crome, MD;  Location: [facility];  Service: Cardiovascular;  Laterality: N/A;  . carpel tunnel     left wrist  . CATARACT EXTRACTION W/ INTRAOCULAR LENS  IMPLANT, BILATERAL  2013  . CERVICAL LAMINECTOMY  2012  . COLONOSCOPY N/A 01/14/2013   Procedure: COLONOSCOPY;  Surgeon: Irene Shipper, MD;  [facility];  Service: Endoscopy;  Laterality: N/A;  . EYE SURGERY    . [place]   left  . LUMBAR LAMINECTOMY  2003  . LUNG BIOPSY Left 12/26/2016   Procedure: LUNG BIOPSY;  Surgeon: [name], MD;  Location: [facility];  Service: Thoracic;  Laterality: Left;  . POSTERIOR FUSION CERVICAL SPINE  2012  . TRIGGER FINGER RELEASE  2011   4th finger left hand  . VIDEO ASSISTED THORACOSCOPY Left 12/26/2016   Procedure: VIDEO ASSISTED THORACOSCOPY;  Surgeon: [name], MD;  Location: [facility];  Service: Thoracic;  Laterality: Left;  [place] VIDEO BRONCHOSCOPY N/A 12/26/2016   Procedure: VIDEO BRONCHOSCOPY;  Surgeon: [name], MD;  Location: [facility];  Service: Thoracic;  Laterality: N/A;    Vitals:   11/09/18 0903 11/09/18 0904  SpO2: 92% 96%    Subjective Assessment - 11/09/18 0914    Subjective  Pt has not had any falls. He still reports his experimental infusions are causing some shortness of breath and blurred vision.    Pertinent History  interstitial lung dz,, DM, arthralgia, DM, HTN, HLD, ACDF 2012 required emergent cervical laminectomy several days after ACDF due to hematoma and cord compresssion    Patient Stated Goals  Be able to walk without wobbling and not fall    Currently in  Pain?  No/denies                       [facility] Adult PT Treatment/Exercise - 11/09/18 0819      Ambulation/Gait   Ambulation/Gait  Yes    Ambulation/Gait Assistance  5: Supervision;4: Min guard    Ambulation/Gait Assistance Details  Pt presents with increased instability as fatigue increases.    Ambulation Distance (Feet)  230 Feet   115x1,   Assistive device  Straight cane    Gait Pattern  Step-through pattern;Wide base of support;Decreased dorsiflexion - right;Poor foot clearance - right;Decreased stride length    Ambulation Surface  Level;Indoor    Pre-Gait Activities  Pt ambulated over compliant surface in // bars without UE support for 5 laps. Pt experienced 1xLOB requiring min assist to regain balance.    Gait Comments  Pt becomes SOB during  second lap around clinic requiring a seated rest break.  O2 sats  were checked shortly after two laps and dropped to 92%.       High Level Balance   High Level Balance Activities  Other (comment)    High Level Balance Comments  In corner with chair in front: Pt performed standing balance with horizontal/vertical head turns on compliant surface for  approx. 30 secs initially with feet apart and progressing with feet together.. Pt then performed standing balance with eyes closed on compliant surface but was adjust to level surface for 2x30 secs.             PT Education - 11/09/18 0900    Education Details  Pt educated on home assessment and fall risk awareness. Pts balance exercises ( HEP) were updated. Pt was encouraged to continue current physical activity with awareness of safety.    Person(s) Educated  Patient    Methods  Explanation    Comprehension  Verbalized understanding       PT Short Term Goals - 11/09/18 0817      PT SHORT TERM GOAL #1   Title  Patient will be independent with HEP to address decr balance and LE weakness (Target for all STGs 10/27/2018)    Baseline  Pt reports compliance and independence with his HEP. 11/09/2018    Time  4    Period  Weeks    Status  Achieved      PT SHORT TERM GOAL #2   Title  Patient will demonstrate reduced fall risk with TUG <15 seconds with LRAD    Baseline  14.28 sec with cane and UE assist. 11/09/2018    Time  4    Period  Weeks    Status  Achieved      PT SHORT TERM GOAL #3   Title  Patient will demonstrate reduced fall risk with 5xSTS <16 sec    Baseline  13.35 11/09/2018    Time  4    Period  Weeks    Status  Achieved      PT SHORT TERM GOAL #4   Title  Patient will complete home assessment (based on provided education) to reduce fall risk related to home environment.     Baseline  Pt reports understanding of home safety and risk factor awareness. 11/09/2018    Time  4    Period  Weeks    Status  Achieved      PT  SHORT TERM GOAL #5   Title  Patient will complete Berg Balance assessment by 4th week for further  delineation of appropriate assistive device and fall risk    Baseline  38/56 11/06/2018 as baseline; PT to set goals as indicated.    Time  4    Period  Weeks    Status  Achieved        PT Long Term Goals - 09/29/18 1734      PT LONG TERM GOAL #1   Title  Patient will be independent with updated HEP (Target for all LTGs 11/26/2018)    Time  8    Period  Weeks    Status  New    Target Date  11/26/18      PT LONG TERM GOAL #2   Title  Demonstrate decr fall risk with TUG <13.5 sec with LRAD    Time  8    Period  Weeks    Status  New      PT LONG TERM GOAL #3   Title  Demonstrate decr fall risk with 5xSTS <12.0 sec    Time  8    Period  Weeks    Status  New      PT LONG TERM GOAL #4   Title  Demonstrate lesser fall risk with improving Berg Balance score by 5 points compared to baseline    Time  8    Period  Weeks    Status  New            Plan - 11/09/18 0905    Clinical Impression Statement  Treatment today focused on patients STGs and assessment for continued therapy. Pt achieved all STGs and one LTG. Patient experiences SOB throughout treatment and O2 saturation levels were assessed (92% after 2 laps rising to 96% quickly with rest). Patients HEP was updated and discussion of possibilty of only needing a few more visits was discussed. Patient would benefitfrom further physical therapy to increase patients independence with self care and balance/activity tolerance.    Clinical Presentation  Evolving    Clinical Decision Making  Moderate    Rehab Potential  Good    Clinical Impairments Affecting Rehab Potential  decr sensation bil feet; decr use of bil UEs due to prior cervical cord compression    PT Frequency  2x / week    PT Treatment/Interventions  ADLs/Self Care Home Management;Aquatic Therapy;Gait training;DME Instruction;Stair training;Functional mobility  training;Therapeutic activities;Therapeutic exercise;Balance training;Neuromuscular re-education;Patient/family education;Orthotic Fit/Training;Manual techniques;Passive range of motion;Vestibular    PT Next Visit Plan  Challenge patient balance and possibly progress the rest of HEP.    Consulted and Agree with Plan of Care  Patient       Patient will benefit from skilled therapeutic intervention in order to improve the following deficits and impairments:  Abnormal gait, Cardiopulmonary status limiting activity, Decreased balance, Decreased mobility, Decreased knowledge of use of DME, Decreased knowledge of precautions, Decreased endurance, Decreased strength, Decreased range of motion, Impaired sensation, Impaired UE functional use  Visit Diagnosis: Muscle weakness (generalized)  Unsteadiness on feet     Problem List Patient Active Problem List   Diagnosis Date Noted  . Syncope 04/27/2018  . Hypoglycemia due to insulin 01/06/2018  . Essential hypertension 01/06/2018  . Diabetes mellitus type 2, with complication, on long term insulin pump ([place]) 01/06/2018  . Syncope and collapse 01/05/2018  . Encounter for therapeutic drug monitoring 06/29/2017  . Obstructive sleep apnea 05/05/2017  . 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[place]) 01/05/2017  . Abnormal chest x-ray 10/12/2016  . Benign neoplasm of colon 01/14/2013    Donnald Garre  SPTA 11/09/2018, 9:14 AM  [facility] [address] [place], [place], [address] Phone: [phone]   Fax:  [phone]  Name: Shane [name] MRN: [id number] Date of Birth: 07/16/1941

## 2018-11-09 NOTE — Patient Instructions (Signed)
Access Code: [email or web address]  URL: [email or web address]  Date: 11/09/2018  Prepared by: Barry Brunner   Exercises  Romberg Stance - 3 reps - 1 sets - 30 hold - 1x daily - 7x weekly  Foam-head turns eyes open - 10 reps - 1 sets - - hold - 1x daily - 7x weekly  Tandem Walking with Counter Support - 5 reps - 1 sets - 1x daily - 7x weekly  Heel rises with counter support - 10 reps - 1 sets - 3 seconds hold - 1x daily - 7x weekly  Toe Raises with Counter Support - 10 reps - 1 sets - 1x daily - 7x weekly  seated ankle eversion - 10 reps - 1 sets - - hold - 1-3x daily - 5x weekly  Standing Single Leg Stance with Counter Support - 3 reps - 1 sets - 10 seconds hold - 1x daily - 7x weekly  Patient Education  Check for Safety  What You Can Do to Prevent Falls

## 2018-11-13 ENCOUNTER — Encounter (HOSPITAL_COMMUNITY)
Admission: RE | Admit: 2018-11-13 | Discharge: 2018-11-13 | Disposition: A | Discharge status: Home / Self Care | Payer: Self-pay | Source: Ambulatory Visit | Attending: Internal Medicine | Admitting: Internal Medicine

## 2018-11-13 ENCOUNTER — Ambulatory Visit: Payer: Medicare Other | Admitting: Physical Therapy

## 2018-11-13 DIAGNOSIS — R2681 Unsteadiness on feet: Secondary | ICD-10-CM

## 2018-11-13 DIAGNOSIS — M6281 Muscle weakness (generalized): Secondary | ICD-10-CM

## 2018-11-13 NOTE — Therapy (Signed)
[facility] [address] [place], [place], [address] Phone: [phone]   Fax:  [phone]  Physical Therapy Treatment  Patient Details  Name: Shane [name] MRN: [id number] Date of Birth: 29-Aug-1941 Referring Provider (PT): Burnard Bunting, MD   Encounter Date: 11/13/2018  PT End of Session - 11/13/18 0852    Visit Number  10    Number of Visits  17    Date for PT Re-Evaluation  11/26/18    Authorization Type  UHC Medicare    Authorization Time Period  09/27/18 to 12/26/2018    PT Start Time  0802    PT Stop Time  0845    PT Time Calculation (min)  43 min    Equipment Utilized During Treatment  Gait belt    Activity Tolerance  Patient limited by fatigue    Behavior During Therapy  [facility] for tasks assessed/performed       Past Medical History:  Diagnosis Date  . 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 Cataract   . Chest pain   . Chronic kidney disease    d/t DM  . Diabetes mellitus    Vgo disposal insulin bolus  simular to insulin pump  . Dyspnea   . GERD (gastroesophageal reflux disease)   . Hyperlipidemia   . Hypertension   . Idiopathic pulmonary fibrosis ([place]) 11/2016  . Neuromuscular disorder ([place])   . PONV (postoperative nausea and vomiting)   . Sleep apnea     uses cpap asked to bring mask and tubing    Past Surgical History:  Procedure Laterality Date  . ANTERIOR FUSION CERVICAL SPINE  2012  . CARDIAC CATHETERIZATION  2011  . CARDIAC CATHETERIZATION N/A 11/09/2016   Procedure: Right Heart Cath;  Surgeon: Belva Crome, MD;  Location: [facility];  Service: Cardiovascular;  Laterality: N/A;  . carpel tunnel     left wrist  . CATARACT EXTRACTION W/ INTRAOCULAR LENS  IMPLANT, BILATERAL  2013  . CERVICAL LAMINECTOMY  2012  . COLONOSCOPY N/A 01/14/2013   Procedure: COLONOSCOPY;  Surgeon: Irene Shipper, MD;  [facility];  Service: Endoscopy;  Laterality: N/A;  . EYE SURGERY    . [place]   left  . LUMBAR LAMINECTOMY  2003  . LUNG BIOPSY Left 12/26/2016   Procedure: LUNG BIOPSY;  Surgeon: [name], MD;  Location: [facility];  Service: Thoracic;  Laterality: Left;  . POSTERIOR FUSION CERVICAL SPINE  2012  . TRIGGER FINGER RELEASE  2011   4th finger left hand  . VIDEO ASSISTED THORACOSCOPY Left 12/26/2016   Procedure: VIDEO ASSISTED THORACOSCOPY;  Surgeon: [name], MD;  Location: [facility];  Service: Thoracic;  Laterality: Left;  [place] Kitchen VIDEO BRONCHOSCOPY N/A 12/26/2016   Procedure: VIDEO BRONCHOSCOPY;  Surgeon: [name], MD;  Location: [facility];  Service: Thoracic;  Laterality: N/A;    There were no vitals filed for this visit.  Subjective Assessment - 11/13/18 0809    Subjective  Pt has not had any falls or changes in episodes. He states his updated HEP is going well but is feeling a bit more unstable today.    Pertinent History  interstitial lung dz,, DM, arthralgia, DM, HTN, HLD, ACDF 2012 required emergent cervical laminectomy several days after ACDF due to hematoma and cord compresssion    Patient Stated Goals  Be able to walk without wobbling and not fall    Currently in  Pain?  No/denies          [facility] Adult PT Treatment/Exercise - 11/13/18 0811      Ambulation/Gait   Ambulation/Gait  Yes    Ambulation/Gait Assistance  5: Supervision;4: Min guard    Ambulation/Gait Assistance Details  Pt becomes SOB and unsteadiness increases, required a seated rest break after two laps.    Ambulation Distance (Feet)  230 Feet   115x1,   Assistive device  Straight cane    Gait Pattern  Step-through pattern;Wide base of support;Decreased dorsiflexion - right;Poor foot clearance - right;Decreased stride length    Ambulation Surface  Level;Indoor    Gait Comments  Pt ambulated two laps through clinic to increase activity tolerance. Pt presented with an increase in SOB compared to earlier treatments. Pt diemonstrates intermittent disuse of his cane requiring  cues for safety.      High Level Balance   High Level Balance Activities  Other (comment)    High Level Balance Comments  In // bars: Pt performed standing balance on compliant surface for 30 sec x 2 with intermittent use of UE; Pt performed stepping strategy while standing on compliant surface BIL LE in fwd direction with UE assist x 10 each; Patient performed 5 laps of side stepping with UE assist; Rockerboard exercises were performed anterior/posterior/lateral direction with static balacne progressing to weight shifting for 30 sec each. Patient required two seated rest breaks during balance exercises due to SOB.               PT Short Term Goals - 11/09/18 0817      PT SHORT TERM GOAL #1   Title  Patient will be independent with HEP to address decr balance and LE weakness (Target for all STGs 10/27/2018)    Baseline  Pt reports compliance and independence with his HEP. 11/09/2018    Time  4    Period  Weeks    Status  Achieved      PT SHORT TERM GOAL #2   Title  Patient will demonstrate reduced fall risk with TUG <15 seconds with LRAD    Baseline  14.28 sec with cane and UE assist. 11/09/2018    Time  4    Period  Weeks    Status  Achieved      PT SHORT TERM GOAL #3   Title  Patient will demonstrate reduced fall risk with 5xSTS <16 sec    Baseline  13.35 11/09/2018    Time  4    Period  Weeks    Status  Achieved      PT SHORT TERM GOAL #4   Title  Patient will complete home assessment (based on provided education) to reduce fall risk related to home environment.     Baseline  Pt reports understanding of home safety and risk factor awareness. 11/09/2018    Time  4    Period  Weeks    Status  Achieved      PT SHORT TERM GOAL #5   Title  Patient will complete Berg Balance assessment by 4th week for further delineation of appropriate assistive device and fall risk    Baseline  38/56 11/06/2018 as baseline; PT to set goals as indicated.    Time  4    Period  Weeks     Status  Achieved        PT Long Term Goals - 09/29/18 1734      PT LONG TERM GOAL #1  Title  Patient will be independent with updated HEP (Target for all LTGs 11/26/2018)    Time  8    Period  Weeks    Status  New    Target Date  11/26/18      PT LONG TERM GOAL #2   Title  Demonstrate decr fall risk with TUG <13.5 sec with LRAD    Time  8    Period  Weeks    Status  New      PT LONG TERM GOAL #3   Title  Demonstrate decr fall risk with 5xSTS <12.0 sec    Time  8    Period  Weeks    Status  New      PT LONG TERM GOAL #4   Title  Demonstrate lesser fall risk with improving Berg Balance score by 5 points compared to baseline    Time  8    Period  Weeks    Status  New            Plan - 11/13/18 3300    Clinical Impression Statement  Treatment today focused on balance and activity tolerance. Pt required several seated rest breaks due to shortness of breath but recovered quickly after break. Pt presented with increased L LE strength and balance compared to R. Pt would benefit from further physical therapy to increase balance and endurance.    Clinical Presentation  Evolving    Clinical Decision Making  Moderate    Rehab Potential  Good    Clinical Impairments Affecting Rehab Potential  decr sensation bil feet; decr use of bil UEs due to prior cervical cord compression    PT Frequency  2x / week    PT Duration  8 weeks    PT Treatment/Interventions  ADLs/Self Care Home Management;Aquatic Therapy;Gait training;DME Instruction;Stair training;Functional mobility training;Therapeutic activities;Therapeutic exercise;Balance training;Neuromuscular re-education;Patient/family education;Orthotic Fit/Training;Manual techniques;Passive range of motion;Vestibular    PT Next Visit Plan  balance, activity tolerance, and work towards [place].    Consulted and Agree with Plan of Care  Patient       Patient will benefit from skilled therapeutic intervention in order to improve the  following deficits and impairments:  Abnormal gait, Cardiopulmonary status limiting activity, Decreased balance, Decreased mobility, Decreased knowledge of use of DME, Decreased knowledge of precautions, Decreased endurance, Decreased strength, Decreased range of motion, Impaired sensation, Impaired UE functional use  Visit Diagnosis: Unsteadiness on feet  Muscle weakness (generalized)     Problem List Patient Active Problem List   Diagnosis Date Noted  . Syncope 04/27/2018  . Hypoglycemia due to insulin 01/06/2018  . Essential hypertension 01/06/2018  . Diabetes mellitus type 2, with complication, on long term insulin pump ([place]) 01/06/2018  . Syncope and collapse 01/05/2018  . Encounter for therapeutic drug monitoring 06/29/2017  . Obstructive sleep apnea 05/05/2017  . 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[place]) 01/05/2017  . Abnormal chest x-ray 10/12/2016  . Benign neoplasm of colon 01/14/2013    Donnald Garre SPTA 11/13/2018, 8:56 AM  [facility] [address] [place], [place], [address] Phone: [phone]   Fax:  [phone]  Name: MAURO ARPS MRN: [id number] Date of Birth: 16-Dec-1940

## 2018-11-15 ENCOUNTER — Ambulatory Visit: Payer: Medicare Other | Admitting: Physical Therapy

## 2018-11-15 ENCOUNTER — Telehealth: Payer: Self-pay | Admitting: Physical Therapy

## 2018-11-15 ENCOUNTER — Encounter (HOSPITAL_COMMUNITY)
Admission: RE | Admit: 2018-11-15 | Discharge: 2018-11-15 | Disposition: A | Discharge status: Home / Self Care | Payer: Self-pay | Source: Ambulatory Visit | Attending: Internal Medicine | Admitting: Internal Medicine

## 2018-11-15 DIAGNOSIS — R2689 Other abnormalities of gait and mobility: Secondary | ICD-10-CM

## 2018-11-15 NOTE — Therapy (Signed)
[facility] [address] [place], [place], [address] Phone: [phone]   Fax:  [phone]  Patient Details  Name: Shane [name] MRN: [id number] Date of Birth: 04-04-1941 Referring Provider:  Burnard Bunting, MD  Encounter Date: 11/15/2018  [name] arrived today with extreme point-tenderness over lateral border of right foot. He denies recalling specific injury, but notes it began to feel sore 2 days ago and has gotten progressively worse. Bil feet inspected with slight edema and erythema over base of rt 5th metatarsal. With degree of numbness he has in his feet, and the degree of tenderness he has, recommended he see his physician. He may need an xray to rule out fracture.   Patient left with no charge and to call his physician's office for an appt.   Rexanne Mano, PT 11/15/2018, 8:19 AM  [facility] [address] [place], [place], [address] Phone: [phone]   Fax:  [phone]

## 2018-11-15 NOTE — Telephone Encounter (Signed)
Opened in error

## 2018-11-19 ENCOUNTER — Encounter (HOSPITAL_COMMUNITY)
Admission: RE | Admit: 2018-11-19 | Discharge: 2018-11-19 | Disposition: A | Discharge status: Home / Self Care | Payer: Self-pay | Source: Ambulatory Visit | Attending: Internal Medicine | Admitting: Internal Medicine

## 2018-11-19 ENCOUNTER — Encounter: Payer: Medicare Other | Admitting: *Deleted

## 2018-11-19 DIAGNOSIS — Z006 Encounter for examination for normal comparison and control in clinical research program: Secondary | ICD-10-CM

## 2018-11-19 DIAGNOSIS — J849 Interstitial pulmonary disease, unspecified: Secondary | ICD-10-CM

## 2018-11-19 MED ORDER — STUDY - FIBROGEN - PAMREVLUMAB OR PLACEBO 10 MG/ML IV INFUSION (PI-RAMASWAMY)
30.0000 mg/kg | Freq: Once | INTRAVENOUS | Status: AC
  Administered 2018-11-19: 3690 mg via INTRAVENOUS
  Filled 2018-11-19: qty 369

## 2018-11-19 NOTE — Research (Signed)
Title: FGCL-3019-091 (FibroGen Study) is a Phase 3, randomized, double-blind, placebo-controlled multicenter international study to evaluate evaluate the efficacy and safety of 30 mg/kg IV infusions of pamrevlumab administered every 3 weeks for 52 weeks as compared to placebo in subjects with Idiopathic Pulmonary Fibrosis. Primary end point is: change in FVC from baseline at week 52  Protocol #: FGCL-3019-091, Clinical Trials #: [id number] Sponsor: [email or web address]  ([place], [place], [place])  Protocol Version for 11/19/2018 date of Amendment 2.0 dated 25SEP2019 Consent Version for 11/19/2018 date of [facility] IRB approved [phone] revised [id number] Investigator Brochure Version for 11/19/2018 date of v17 dated 25OCT2018  Key Features of Pamrevlumab (FG-3019) the study drug: a recombinant fully human IgG kappa monoclonal antibody that binds to CTGF and is being developed for treatment of diseases in which tissue fibrosis has a major pathogenic role. In particular, pamrevlumab appears to disrupt a CTGF autocrine loop in mesenchymal cells like myofibroblasts that reduces their recruitment of leukocytes like macrophages, mast and dendritic cells via chemokine secretion. This disruption results in collapse of the cellular crosstalk that drives tissue remodeling.      Clinical Research Coordinator note : This visit for Subject Shane [name] with DOB: 10-03-1941 on 11/19/2018 for the above protocol is Visit/Encounter #week 3  and is for purpose of research . The consent for this encounter is under Protocol Version Amendment 2.0 and  is currently IRB approved.Subject expressed continued interest and consent in continuing as a study subject. Subject confirmed that there was no change in contact information (e.g. address, telephone, email). Subject thanked for participation in research and contribution to science.   In this visit 11/19/2018 the [facility] Caller was staffed in the hospital for standard of  care rounding leading to the [place] being immediately available for this visit. The subject , prior to any study related procedures being performed reconsented to the [place] approved [phone] Revised [id number] informed consent document. The PI was present during the reconsenting process.   Dorothea Glassman, RN was the Nurse on duty today for the subjects infusion.  Alveria Apley CPhT was the pharmacy personnel who prepared the study drug.    Additional details:  All study related pre and post infusion assessments were completed as per the above stated protocol requirements.The subject noted resolution of his rash as of [id number]. The subeject also reported Right Foot pain since Wednesday December 11th that is still present at today's visit. Infusion began at 09:27 and ran over approximately 2 hours. The subject also had 1 hour of post infusion observation in which the subject reported no complaints during that time. IV remained placed and accessible throughout the entire observation period. The subject was discharged from clinic with no complaints at 12:45 and will return in 3 weeks for next infusion. For Further details regarding today's visit please refer to subject paper source binder  Signed by  T. [place] Coordinator [place], [place] 15:21pm 11/19/2018

## 2018-11-20 ENCOUNTER — Encounter (HOSPITAL_COMMUNITY)
Admission: RE | Admit: 2018-11-20 | Discharge: 2018-11-20 | Disposition: A | Discharge status: Home / Self Care | Payer: Self-pay | Source: Ambulatory Visit | Attending: Internal Medicine | Admitting: Internal Medicine

## 2018-11-20 ENCOUNTER — Other Ambulatory Visit: Payer: Self-pay | Admitting: Internal Medicine

## 2018-11-20 ENCOUNTER — Ambulatory Visit: Payer: Medicare Other | Admitting: Physical Therapy

## 2018-11-20 DIAGNOSIS — M79671 Pain in right foot: Secondary | ICD-10-CM

## 2018-11-20 DIAGNOSIS — R609 Edema, unspecified: Secondary | ICD-10-CM

## 2018-11-22 ENCOUNTER — Ambulatory Visit: Payer: Medicare Other | Admitting: Physical Therapy

## 2018-11-22 ENCOUNTER — Encounter (HOSPITAL_COMMUNITY)
Admission: RE | Admit: 2018-11-22 | Discharge: 2018-11-22 | Disposition: A | Discharge status: Home / Self Care | Payer: Self-pay | Source: Ambulatory Visit | Attending: Internal Medicine | Admitting: Internal Medicine

## 2018-11-25 ENCOUNTER — Other Ambulatory Visit: Payer: Medicare Other

## 2018-11-27 ENCOUNTER — Encounter (HOSPITAL_COMMUNITY)
Admission: RE | Admit: 2018-11-27 | Discharge: 2018-11-27 | Disposition: A | Discharge status: Home / Self Care | Payer: Self-pay | Source: Ambulatory Visit | Attending: Internal Medicine | Admitting: Internal Medicine

## 2018-11-29 ENCOUNTER — Ambulatory Visit (HOSPITAL_COMMUNITY): Payer: Medicare Other

## 2018-11-29 ENCOUNTER — Telehealth: Payer: Self-pay | Admitting: Internal Medicine

## 2018-11-29 ENCOUNTER — Encounter (HOSPITAL_COMMUNITY)
Admission: RE | Admit: 2018-11-29 | Discharge: 2018-11-29 | Disposition: A | Discharge status: Home / Self Care | Payer: Self-pay | Source: Ambulatory Visit | Attending: Internal Medicine | Admitting: Internal Medicine

## 2018-11-29 NOTE — Telephone Encounter (Signed)
Study: BRAVE-1: Bronchial Sample Collection for a Novel Genomic Test Protocol Version Amendment 2 Date: October 20, 2016  Spoke to spouse- [name] today, [id number] @11 :37 am.  This call was to notify Mr. Shane [name] about research site's (- [facility], [facility]) relocation to [address], [address], [place] [address].  [name] told that they are aware of the site's new address and this message will be relayed to the patient for this study.  Zenon Mayo, Study Coordinator

## 2018-11-29 NOTE — Telephone Encounter (Signed)
Thanks   SIGNATURE    Dr. Brand Males, M.D., F.C.C.P, ACRP-CPI Pulmonary and Critical Care Medicine Research Investigator, [facility] [place] Staff Physician, [place] Director - Interstitial Lung Disease  Program  [facility] [place], [place], [address]  Pager: [phone], If no answer or between  15:00h - 7:00h: call [phone] Telephone: [phone]  12:53 PM 11/29/2018

## 2018-12-01 ENCOUNTER — Encounter (HOSPITAL_COMMUNITY): Payer: Self-pay | Admitting: Emergency Medicine

## 2018-12-01 ENCOUNTER — Emergency Department (HOSPITAL_COMMUNITY)
Admission: EM | Admit: 2018-12-01 | Discharge: 2018-12-01 | Disposition: A | Discharge status: Home / Self Care | Payer: Medicare Other | Attending: Emergency Medicine | Admitting: Emergency Medicine

## 2018-12-01 DIAGNOSIS — I9589 Other hypotension: Secondary | ICD-10-CM

## 2018-12-01 DIAGNOSIS — I129 Hypertensive chronic kidney disease with stage 1 through stage 4 chronic kidney disease, or unspecified chronic kidney disease: Secondary | ICD-10-CM | POA: Diagnosis not present

## 2018-12-01 DIAGNOSIS — Z79899 Other long term (current) drug therapy: Secondary | ICD-10-CM | POA: Diagnosis not present

## 2018-12-01 DIAGNOSIS — Z7982 Long term (current) use of aspirin: Secondary | ICD-10-CM | POA: Diagnosis not present

## 2018-12-01 DIAGNOSIS — N189 Chronic kidney disease, unspecified: Secondary | ICD-10-CM | POA: Diagnosis not present

## 2018-12-01 DIAGNOSIS — Z794 Long term (current) use of insulin: Secondary | ICD-10-CM | POA: Insufficient documentation

## 2018-12-01 DIAGNOSIS — R55 Syncope and collapse: Secondary | ICD-10-CM | POA: Diagnosis present

## 2018-12-01 DIAGNOSIS — E1122 Type 2 diabetes mellitus with diabetic chronic kidney disease: Secondary | ICD-10-CM | POA: Insufficient documentation

## 2018-12-01 LAB — I-STAT TROPONIN, ED: Troponin i, poc: 0.01 ng/mL (ref 0.00–0.08)

## 2018-12-01 LAB — COMPREHENSIVE METABOLIC PANEL
ALT: 20 U/L (ref 0–44)
AST: 20 U/L (ref 15–41)
Albumin: 2.7 g/dL — ABNORMAL LOW (ref 3.5–5.0)
Alkaline Phosphatase: 60 U/L (ref 38–126)
Anion gap: 7 (ref 5–15)
BUN: 28 mg/dL — ABNORMAL HIGH (ref 8–23)
CO2: 22 mmol/L (ref 22–32)
Calcium: 7.8 mg/dL — ABNORMAL LOW (ref 8.9–10.3)
Chloride: 109 mmol/L (ref 98–111)
Creatinine, Ser: 1.5 mg/dL — ABNORMAL HIGH (ref 0.61–1.24)
GFR calc Af Amer: 51 mL/min — ABNORMAL LOW (ref >60)
GFR calc non Af Amer: 44 mL/min — ABNORMAL LOW (ref >60)
Glucose, Bld: 134 mg/dL — ABNORMAL HIGH (ref 70–99)
Potassium: 3.7 mmol/L (ref 3.5–5.1)
Sodium: 138 mmol/L (ref 135–145)
Total Bilirubin: 0.7 mg/dL (ref 0.3–1.2)
Total Protein: 6 g/dL — ABNORMAL LOW (ref 6.5–8.1)

## 2018-12-01 LAB — CBC WITH DIFFERENTIAL/PLATELET
Abs Immature Granulocytes: 0.04 10*3/uL (ref 0.00–0.07)
Basophils Absolute: 0 10*3/uL (ref 0.0–0.1)
Basophils Relative: 0 %
Eosinophils Absolute: 0.2 10*3/uL (ref 0.0–0.5)
Eosinophils Relative: 4 %
HCT: 38.1 % — ABNORMAL LOW (ref 39.0–52.0)
Hemoglobin: 12.7 g/dL — ABNORMAL LOW (ref 13.0–17.0)
Immature Granulocytes: 1 %
Lymphocytes Relative: 28 %
Lymphs Abs: 1.9 10*3/uL (ref 0.7–4.0)
MCH: 30.2 pg (ref 26.0–34.0)
MCHC: 33.3 g/dL (ref 30.0–36.0)
MCV: 90.7 fL (ref 80.0–100.0)
Monocytes Absolute: 0.9 10*3/uL (ref 0.1–1.0)
Monocytes Relative: 13 %
Neutro Abs: 3.7 10*3/uL (ref 1.7–7.7)
Neutrophils Relative %: 54 %
Platelets: 219 10*3/uL (ref 150–400)
RBC: 4.2 MIL/uL — ABNORMAL LOW (ref 4.22–5.81)
RDW: 13 % (ref 11.5–15.5)
WBC: 6.8 10*3/uL (ref 4.0–10.5)
nRBC: 0 % (ref 0.0–0.2)

## 2018-12-01 LAB — CBG MONITORING, ED: Glucose-Capillary: 118 mg/dL — ABNORMAL HIGH (ref 70–99)

## 2018-12-01 MED ORDER — SODIUM CHLORIDE 0.9 % IV SOLN
INTRAVENOUS | Status: DC
  Administered 2018-12-01: 12:00:00 via INTRAVENOUS

## 2018-12-01 NOTE — ED Triage Notes (Signed)
Per EMS: pt from home with c/o hypotension, bradycardia, and syncopal episode x2 witnessed by EMS.  Pt pale and diaphoretic upon EMS arrival.  Pt denies any pain.  Pt had a similar episode per wife (date unknown).

## 2018-12-01 NOTE — ED Notes (Signed)
Vital signs were not obtained at standing position. Patient became weak when attempting to stand. Patient also stated feeling of some dizziness upon attempting to stand.

## 2018-12-01 NOTE — ED Provider Notes (Signed)
[facility] EMERGENCY DEPARTMENT Provider Note   CSN: [id number] Arrival date & time: 12/01/18  1051     History   Chief Complaint Chief Complaint  Patient presents with  . Hypotension  . Loss of Consciousness    HPI Shane [name] is a 77 y.o. male.  77 year old male presents after having syncope just prior to arrival.  History of multiple episodes in the past and no diagnosis made.  Was at home when he became dizzy and lightheaded.  EMS was called and patient was hypotensive and bradycardic.  Responded well to IV fluids.  He denies any recent illnesses or medication changes.  No associated chest pain or abdominal discomfort.  No shortness of breath or severe headache.  Denies any focal weakness.  Denies any recent fever or chills.  Feels better after IV fluids.     Past Medical History:  Diagnosis Date  . Cancer ([place])    ling  . Cataract   . Chest pain   . Chronic kidney disease    d/t DM  . Diabetes mellitus    Vgo disposal insulin bolus  simular to insulin pump  . Dyspnea   . GERD (gastroesophageal reflux disease)   . Hyperlipidemia   . Hypertension   . Idiopathic pulmonary fibrosis ([place]) 11/2016  . Neuromuscular disorder ([place])   . PONV (postoperative nausea and vomiting)   . Sleep apnea     uses cpap asked to bring mask and tubing    Patient Active Problem List   Diagnosis Date Noted  . Syncope 04/27/2018  . Hypoglycemia due to insulin 01/06/2018  . Essential hypertension 01/06/2018  . Diabetes mellitus type 2, with complication, on long term insulin pump ([place]) 01/06/2018  . Syncope and collapse 01/05/2018  . Encounter for therapeutic drug monitoring 06/29/2017  . Obstructive sleep apnea 05/05/2017  . 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[place]) 01/05/2017  . Abnormal chest x-ray 10/12/2016  . 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 ANTERIOR FUSION CERVICAL SPINE  2012  . CARDIAC  CATHETERIZATION  2011  . CARDIAC CATHETERIZATION N/A 11/09/2016   Procedure: Right Heart Cath;  Surgeon: Belva Crome, MD;  Location: [facility];  Service: Cardiovascular;  Laterality: N/A;  . carpel tunnel     left wrist  . CATARACT EXTRACTION W/ INTRAOCULAR LENS  IMPLANT, BILATERAL  2013  . CERVICAL LAMINECTOMY  2012  . COLONOSCOPY N/A 01/14/2013   Procedure: COLONOSCOPY;  Surgeon: Irene Shipper, MD;  Location: [facility];  Service: Endoscopy;  Laterality: N/A;  . EYE SURGERY    . [place]   left  . LUMBAR LAMINECTOMY  2003  . LUNG BIOPSY Left 12/26/2016   Procedure: LUNG BIOPSY;  Surgeon: [name], MD;  Location: [facility];  Service: Thoracic;  Laterality: Left;  . POSTERIOR FUSION CERVICAL SPINE  2012  . TRIGGER FINGER RELEASE  2011   4th finger left hand  . VIDEO ASSISTED THORACOSCOPY Left 12/26/2016   Procedure: VIDEO ASSISTED THORACOSCOPY;  Surgeon: [name], MD;  Location: [facility];  Service: Thoracic;  Laterality: Left;  [place] VIDEO BRONCHOSCOPY N/A 12/26/2016   Procedure: VIDEO BRONCHOSCOPY;  Surgeon: [name], MD;  Location: [facility];  Service: Thoracic;  Laterality: N/A;        Home Medications    Prior to Admission medications   Medication Sig Start Date End Date Taking? Authorizing Provider  Ascorbic Acid (VITAMIN C) 1000 MG tablet Take 1,000 mg by mouth daily.    [provider]  aspirin 81 MG tablet Take 81 mg by mouth daily.      [provider]  atorvastatin (LIPITOR) 40 MG tablet Take 1 tablet (40 mg total) by mouth daily. Patient not taking: Reported on 09/27/2018 05/21/18 05/21/19  Lelon Perla, MD  clotrimazole-betamethasone (LOTRISONE) cream APPLY TO AFFECTED AREA TWICE A DAY AS NEEDED FOR IRRITATION 01/16/17   [provider]  Continuous Blood Gluc Sensor (FREESTYLE LIBRE 14 DAY SENSOR) MISC 1 patch every 14 (fourteen) days.    [provider]  diazepam (VALIUM) 5 MG tablet Take 5  mg by mouth at bedtime as needed (for sleep).     [provider]  EPINEPHrine 0.3 mg/0.3 mL IJ SOAJ injection Inject 0.3 mg into the muscle once as needed (for anaphylactic reaction).  01/30/18   [provider]  fluticasone (FLONASE) 50 MCG/ACT nasal spray Place 2 sprays into both nostrils daily as needed for allergies or rhinitis.  01/19/18   [provider]  furosemide (LASIX) 40 MG tablet Take 40 mg by mouth daily. 02/15/18   [provider]  insulin detemir (LEVEMIR) 100 UNIT/ML injection Inject 22 Units into the skin at bedtime.    [provider]  insulin lispro (HUMALOG) 100 UNIT/ML injection Use with V-Go 40    [provider]  Insulin Pump Disposable (V-GO 40) KIT Dispose V-Go bag after 24 hours of use    [provider]  KLOR-CON M20 20 MEQ tablet Take 20 mEq by mouth daily.  09/26/16   [provider]  lisinopril (PRINIVIL,ZESTRIL) 20 MG tablet Take 20 mg by mouth daily.  09/08/16   [provider]  losartan (COZAAR) 50 MG tablet Take 50 mg by mouth daily.    [provider]  Multiple Vitamin (MULTIVITAMIN) tablet Take 1 tablet by mouth daily.      [provider]  pantoprazole (PROTONIX) 40 MG tablet Take 40 mg by mouth daily.      [provider]  PROAIR HFA 108 [phone] Base) MCG/ACT inhaler Inhale 2 puffs into the lungs every 4 (four) hours as needed for wheezing or shortness of breath.  10/03/16   [provider]  promethazine-phenylephrine (PROMETHAZINE VC PLAIN) 6.25-5 MG/5ML SYRP Take 5 mLs by mouth every 4 (four) hours as needed for congestion.    [provider]    Family History Family History  Problem Relation Age of Onset  . Diabetes Mellitus II Mother   . Emphysema Father 28  . Colon cancer Neg Hx   . Esophageal cancer Neg Hx   . Rectal cancer Neg Hx   . Stomach cancer Neg Hx     Social History Social History   Tobacco Use  . Smoking status:  Never Smoker  . Smokeless tobacco: Never Used  Substance Use Topics  . Alcohol use: No  . Drug use: No     Allergies   Codeine; Other; and Ofev [nintedanib]   Review of Systems Review of Systems  All other systems reviewed and are negative.    Physical Exam Updated Vital Signs BP (!) 141/72   Pulse (!) 54   Resp 19   Ht 1.829 m (6')   Wt 122.4 kg   SpO2 98%   BMI 36.60 kg/m   Physical Exam Vitals signs and nursing note reviewed.  Constitutional:      General: He is  not in acute distress.    Appearance: Normal appearance. He is well-developed. He is not toxic-appearing.  HENT:     Head: Normocephalic and atraumatic.  Eyes:     General: Lids are normal.     Conjunctiva/sclera: Conjunctivae normal.     Pupils: Pupils are equal, round, and reactive to light.  Neck:     Musculoskeletal: Normal range of motion and neck supple.     Thyroid: No thyroid mass.     Trachea: No tracheal deviation.  Cardiovascular:     Rate and Rhythm: Normal rate and regular rhythm.     Heart sounds: Normal heart sounds. No murmur. No gallop.   Pulmonary:     Effort: Pulmonary effort is normal. No respiratory distress.     Breath sounds: Normal breath sounds. No stridor. No decreased breath sounds, wheezing, rhonchi or rales.  Abdominal:     General: Bowel sounds are normal. There is no distension.     Palpations: Abdomen is soft.     Tenderness: There is no abdominal tenderness. There is no rebound.  Musculoskeletal: Normal range of motion.        General: No tenderness.  Skin:    General: Skin is warm and dry.     Findings: No abrasion or rash.  Neurological:     Mental Status: He is alert and oriented to person, place, and time.     GCS: GCS eye subscore is 4. GCS verbal subscore is 5. GCS motor subscore is 6.     Cranial Nerves: No cranial nerve deficit.     Sensory: No sensory deficit.  Psychiatric:        Speech: Speech normal.        Behavior: Behavior normal.      ED  Treatments / Results  Labs (all labs ordered are listed, but only abnormal results are displayed) Labs Reviewed  CBG MONITORING, ED - Abnormal; Notable for the following components:      Result Value   Glucose-Capillary 118 (*)    All other components within normal limits  CBC WITH DIFFERENTIAL/PLATELET  COMPREHENSIVE METABOLIC PANEL  I-STAT TROPONIN, ED    EKG EKG Interpretation  Date/Time:  Saturday December 01 2018 11:01:09 EST Ventricular Rate:  54 PR Interval:    QRS Duration: 173 QT Interval:  458 QTC Calculation: 434 R Axis:   -36 Text Interpretation:  Sinus rhythm Prolonged PR interval Right bundle branch block No significant change since last tracing Confirmed by Lacretia Leigh ([id number]) on 12/01/2018 11:26:59 AM   Radiology No results found.  Procedures Procedures (including critical care time)  Medications Ordered in ED Medications  0.9 %  sodium chloride infusion (has no administration in time range)     Initial Impression / Assessment and Plan / ED Course  I have reviewed the triage vital signs and the nursing notes.  Pertinent labs & imaging results that were available during my care of the patient were reviewed by me and considered in my medical decision making (see chart for details).     Patient states that he feels better after receiving IV fluids.  According to he and his wife this is happened to him multiple times in the past.  States that this episode is no different.  Feels that he became dehydrated.  He will follow-up with his doctor.  Final Clinical Impressions(s) / ED Diagnoses   Final diagnoses:  None    ED Discharge Orders    None  Lacretia Leigh, MD 12/01/18 1351

## 2018-12-01 NOTE — Discharge Instructions (Addendum)
Call your doctor on Monday to schedule a follow-up visit.  Return here if you pass out again

## 2018-12-04 ENCOUNTER — Encounter (HOSPITAL_COMMUNITY): Payer: Self-pay

## 2018-12-04 ENCOUNTER — Ambulatory Visit (HOSPITAL_COMMUNITY): Payer: Medicare Other

## 2018-12-06 ENCOUNTER — Encounter (HOSPITAL_COMMUNITY)
Admission: RE | Admit: 2018-12-06 | Discharge: 2018-12-06 | Disposition: A | Discharge status: Home / Self Care | Payer: Self-pay | Source: Ambulatory Visit | Attending: Internal Medicine | Admitting: Internal Medicine

## 2018-12-06 ENCOUNTER — Ambulatory Visit (HOSPITAL_COMMUNITY): Payer: Medicare Other

## 2018-12-06 DIAGNOSIS — K219 Gastro-esophageal reflux disease without esophagitis: Secondary | ICD-10-CM | POA: Insufficient documentation

## 2018-12-06 DIAGNOSIS — Z79899 Other long term (current) drug therapy: Secondary | ICD-10-CM | POA: Insufficient documentation

## 2018-12-06 DIAGNOSIS — Z7982 Long term (current) use of aspirin: Secondary | ICD-10-CM | POA: Insufficient documentation

## 2018-12-06 DIAGNOSIS — G4730 Sleep apnea, unspecified: Secondary | ICD-10-CM | POA: Insufficient documentation

## 2018-12-06 DIAGNOSIS — E785 Hyperlipidemia, unspecified: Secondary | ICD-10-CM | POA: Insufficient documentation

## 2018-12-06 DIAGNOSIS — I129 Hypertensive chronic kidney disease with stage 1 through stage 4 chronic kidney disease, or unspecified chronic kidney disease: Secondary | ICD-10-CM | POA: Insufficient documentation

## 2018-12-06 DIAGNOSIS — J84112 Idiopathic pulmonary fibrosis: Secondary | ICD-10-CM | POA: Insufficient documentation

## 2018-12-06 DIAGNOSIS — N189 Chronic kidney disease, unspecified: Secondary | ICD-10-CM | POA: Insufficient documentation

## 2018-12-06 DIAGNOSIS — E1122 Type 2 diabetes mellitus with diabetic chronic kidney disease: Secondary | ICD-10-CM | POA: Insufficient documentation

## 2018-12-06 DIAGNOSIS — Z794 Long term (current) use of insulin: Secondary | ICD-10-CM | POA: Insufficient documentation

## 2018-12-10 ENCOUNTER — Encounter (HOSPITAL_COMMUNITY)
Admission: RE | Admit: 2018-12-10 | Discharge: 2018-12-10 | Disposition: A | Discharge status: Home / Self Care | Payer: Self-pay | Source: Ambulatory Visit | Attending: Internal Medicine | Admitting: Internal Medicine

## 2018-12-10 DIAGNOSIS — Z006 Encounter for examination for normal comparison and control in clinical research program: Secondary | ICD-10-CM

## 2018-12-10 DIAGNOSIS — J84112 Idiopathic pulmonary fibrosis: Secondary | ICD-10-CM

## 2018-12-10 MED ORDER — STUDY - FIBROGEN - PAMREVLUMAB OR PLACEBO 10 MG/ML IV INFUSION (PI-RAMASWAMY)
30.0000 mg/kg | Freq: Once | INTRAVENOUS | Status: AC
  Administered 2018-12-10: 3690 mg via INTRAVENOUS
  Filled 2018-12-10: qty 369

## 2018-12-10 NOTE — Research (Addendum)
Title: FGCL-3019-091 (FibroGen Study) is a Phase 3, randomized, double-blind, placebo-controlled multicenter international study to evaluate evaluate the efficacy and safety of 30 mg/kg IV infusions of pamrevlumab administered every 3 weeks for 52 weeks as compared to placebo in subjects with Idiopathic Pulmonary Fibrosis. Primary end point is: change in FVC from baseline at week 52  Protocol #: FGCL-3019-091, Clinical Trials #: [id number] Sponsor: [email or web address]  ([place], [place], [place])  Protocol Version for 12/10/2018 date of Amendment 2.0 25SEP2019 Consent Version for 12/10/2018 date of [facility] IRB Approved Version [phone] Investigator Brochure Version for 12/10/2018 date of Version 45 25OCT2018   Key Features of Pamrevlumab (FG-3019) the study drug: a recombinant fully human IgG kappa monoclonal antibody that binds to CTGF and is being developed for treatment of diseases in which tissue fibrosis has a major pathogenic role. In particular, pamrevlumab appears to disrupt a CTGF autocrine loop in mesenchymal cells like myofibroblasts that reduces their recruitment of leukocytes like macrophages, mast and dendritic cells via chemokine secretion. This disruption results in collapse of the cellular crosstalk that drives tissue remodeling.    Clinical Research Coordinator note : This visit for Subject Shane [name] with DOB: 11/16/1941 on 12/10/2018 for the above protocol is Visit/Encounter Week 6 visit  and is for purpose of research . The consent for this encounter is currently IRB approved. Subject expressed continued interest and consent in continuing as a study subject. Subject confirmed that there was no change in contact information (e.g. address, telephone, email). Subject thanked for participation in research and contribution to science.   In this visit 12/10/2018 the subject will be evaluated via video conferencing by investigator Brand Males. Dr. Chase Caller was readily available throughout the  entirety of the visit via video conferencing and phone. The subject presented to clinic with no complaints stating he felt well today.Since his last visit the subject reported return of Mild Rash Bilateral antecubital space. The subject reports 1/3 of the severity of last rash. Additionally rash was not accompanied with any itching this time and subject stated lasted approximately 2 days beginning [id number] and resolving 21DEC2019. The subject also reported Visit to Hospital ER on 28DEC2019 transported via ambulance for loss of consciousness, Hypotension, and Syncope. The subject described the account as a repeat of the same problem he has had multiple times in the past. The subject was in the ED for approximately 3 hours (subject reported) where he received IV fluids and was sent home once subject returned to baseline. He reports no recurrence of any of the above stated problems since hospital visit. Subject states he had no cause of alarm "its just one of those things that happens to him from time to time once he gets dehydrated". After review of the subjects current state the PI agreed to continue with today's visit infusion as planned.  The visit nurse for the visit was [facility] personnel responsible for IV admixture was [employer].   IV was placed in subject at 08:20 and remained accessible throughout the entire infusion and post observation period. IV line was removed at 11:44.  The subject was infused over 1 hour and all IV was administered. The subject was discharged at 11:50 with no complaints. All Pre and post assesments were performed as per required by above stated protocol.   The subject will return to clinic in 3 weeks for his week 9 visit. For further details regarding today's visit please refer to the subjects paper source binder.   Signed by  Rosaland Lao BS, CCRC, [place] Coordinator  [place], [place] 14:58 12/10/2018

## 2018-12-10 NOTE — Research (Signed)
IPF PRO Registry Purpose: To collect data and biological samples that will support future research studies.  Registry will describe current approaches to diagnosis and treatment of IPF, analyze participant characteristics to describe the natural history of the disease, assess quality of life, describe participants interactions with the health care system, describe IPF treatment practices across multiple institutions, and utilize biological samples linked to well characterized IPF participants to identify disease biomarkers  Clinical Research Coordinator note : This visit for Subject Shane [name] with DOB: 26-Aug-1941 on 12/10/2018 for the above protocol is Visit/Encounter # 18 month follow up  and is for purpose of research . The consent for this encounter is under Protocol Version Amendment 2 and  IS currently IRB approved. Subject expressed continued interest and consent in continuing as a study subject. Subject confirmed that there was  NO change in contact information (e.g. address, telephone, email). Subject thanked for participation in research and contribution to science.   In this visit 12/10/2018 the subject completed his Patient reported outcomes and had blood collected as per the above stated protocol. The subject confirmed Imaging had been done since last assessment. Images will be submitted to the sponsor per protocol requirements. The subject will return to clinic in approximately six months for his next scheduled follow up visit.   Signed by  T. Early Chars BS, [place], [place], [place] 14:15 12/10/2018

## 2018-12-11 ENCOUNTER — Ambulatory Visit (HOSPITAL_COMMUNITY): Payer: Medicare Other

## 2018-12-11 ENCOUNTER — Encounter (HOSPITAL_COMMUNITY): Payer: Self-pay

## 2018-12-12 ENCOUNTER — Ambulatory Visit (INDEPENDENT_AMBULATORY_CARE_PROVIDER_SITE_OTHER): Payer: Medicare Other | Admitting: Internal Medicine

## 2018-12-12 ENCOUNTER — Encounter: Payer: Self-pay | Admitting: Internal Medicine

## 2018-12-12 DIAGNOSIS — J84112 Idiopathic pulmonary fibrosis: Secondary | ICD-10-CM | POA: Diagnosis not present

## 2018-12-12 DIAGNOSIS — G4733 Obstructive sleep apnea (adult) (pediatric): Secondary | ICD-10-CM | POA: Diagnosis not present

## 2018-12-12 NOTE — Assessment & Plan Note (Signed)
Effects from CPAP with improved sleep quality.  Download confirms good compliance and control. Plan-continue CPAP auto 5-20

## 2018-12-12 NOTE — Progress Notes (Signed)
HPI male never smoker followed for OSA complicated by ILD( Dr Chase Caller), DM 2, GERD, HBP, Home Sleep Test 06/24/17-AHI 38.6/hour, desaturation to 64%, body weight 261 pounds ----------------------------------------------------------------------- 09/25/17- 78 year old male never smoker followed for OSA complicated by ILD( Dr Chase Caller), DM 2, GERD, HBP, Unattended Home Sleep Test 7/2/813-AHI 38.6/hour, desaturation to 64%, body weight 261 pounds CPAP auto 5-20/ [facility] OSA; DME: [facility]. Pt wears CPAP nightly and DL attached. No new supplies needed at this time.  He likes his replacement CPAP machine much better and is comfortable with nasal pillows mask and current pressure. Download 100% compliance averaging over 8 hours per night, AHI 0.9/hour.  12/12/2018- male never smoker followed for OSA complicated by ILD( Dr Chase Caller), DM 2, GERD, HBP, CPAP auto 5-20/[facility] -----OSA: DME [facility]. Pt wears CPAP nightly and DL attached. No new supplies needed at this time. Pressure works well for patient.  He feels he is doing fine with CPAP with no problems to report and says he is "used to it" and does not sleep without it.  This machine is about 78 years old. Download 100% compliance AHI 1.1/hour He is followed for interstitial lung disease and participating in a clinical study  ROS-see HPI   + = positive Constitutional:    weight loss, night sweats, fevers, chills, fatigue, lassitude. HEENT:    headaches, difficulty swallowing, tooth/dental problems, sore throat,       sneezing, itching, ear ache, nasal congestion, post nasal drip, snoring CV:    chest pain, orthopnea, PND, swelling in lower extremities, anasarca,                                                  dizziness, palpitations Resp:   shortness of breath with exertion or at rest.                productive cough,   non-productive cough, coughing up of blood.              change in color of mucus.  wheezing.   Skin:    rash or  lesions. GI:  No-   heartburn, indigestion, abdominal pain, nausea, vomiting, diarrhea,                 change in bowel habits, loss of appetite GU: dysuria, change in color of urine, no urgency or frequency.   flank pain. MS:   joint pain, stiffness, decreased range of motion, back pain. Neuro-     nothing unusual Psych:  change in mood or affect.  depression or anxiety.   memory loss.  OBJ- Physical Exam General- Alert, Oriented, Affect-appropriate, Distress- none acute, Muscular Skin- rash-none, lesions- none, excoriation- none Lymphadenopathy- none Head- atraumatic            Eyes- Gross vision intact, PERRLA, conjunctivae and secretions clear            Ears- Hearing, canals-normal            Nose- Clear, no-Septal dev, mucus, polyps, erosion, perforation             Throat- Mallampati IV , mucosa clear , drainage- none, tonsils- atrophic Neck- flexible , trachea midline, no stridor , thyroid nl, carotid no bruit Chest - symmetrical excursion , unlabored           Heart/CV- RRR , no murmur ,  no gallop  , no rub, nl s1 s2                           - JVD- none , edema- none, stasis changes- none, varices- none           Lung- + few scattered crackles, wheeze- none, cough- none , dullness-none, rub- none           Chest wall-  Abd-  Br/ Gen/ Rectal- Not done, not indicated Extrem- cyanosis- none, clubbing, none, atrophy- none, strength- nl Neuro- grossly intact to observation

## 2018-12-12 NOTE — Patient Instructions (Signed)
We can continue CPAP auto 5-20, mask of choice, humidifier, supplies, [facility]  Please call if we can help

## 2018-12-12 NOTE — Assessment & Plan Note (Signed)
He denies recent exacerbation of cough or shortness of breath.  He has been followed by Dr. Chase Caller.

## 2018-12-13 ENCOUNTER — Ambulatory Visit (HOSPITAL_COMMUNITY): Payer: Medicare Other

## 2018-12-13 ENCOUNTER — Encounter (HOSPITAL_COMMUNITY)
Admission: RE | Admit: 2018-12-13 | Discharge: 2018-12-13 | Disposition: A | Discharge status: Home / Self Care | Payer: Self-pay | Source: Ambulatory Visit | Attending: Internal Medicine | Admitting: Internal Medicine

## 2018-12-18 ENCOUNTER — Ambulatory Visit (HOSPITAL_COMMUNITY): Payer: Medicare Other

## 2018-12-18 ENCOUNTER — Encounter (HOSPITAL_COMMUNITY)
Admission: RE | Admit: 2018-12-18 | Discharge: 2018-12-18 | Disposition: A | Discharge status: Home / Self Care | Payer: Self-pay | Source: Ambulatory Visit | Attending: Internal Medicine | Admitting: Internal Medicine

## 2018-12-20 ENCOUNTER — Encounter (HOSPITAL_COMMUNITY)
Admission: RE | Admit: 2018-12-20 | Discharge: 2018-12-20 | Disposition: A | Discharge status: Home / Self Care | Payer: Self-pay | Source: Ambulatory Visit | Attending: Internal Medicine | Admitting: Internal Medicine

## 2018-12-20 ENCOUNTER — Ambulatory Visit (HOSPITAL_COMMUNITY): Payer: Medicare Other

## 2018-12-25 ENCOUNTER — Encounter (HOSPITAL_COMMUNITY): Payer: Self-pay

## 2018-12-25 ENCOUNTER — Ambulatory Visit (HOSPITAL_COMMUNITY): Payer: Medicare Other

## 2018-12-27 ENCOUNTER — Ambulatory Visit (HOSPITAL_COMMUNITY): Payer: Medicare Other

## 2018-12-27 ENCOUNTER — Encounter (HOSPITAL_COMMUNITY)
Admission: RE | Admit: 2018-12-27 | Discharge: 2018-12-27 | Disposition: A | Discharge status: Home / Self Care | Payer: Self-pay | Source: Ambulatory Visit | Attending: Internal Medicine | Admitting: Internal Medicine

## 2018-12-31 ENCOUNTER — Encounter (HOSPITAL_COMMUNITY)
Admission: RE | Admit: 2018-12-31 | Discharge: 2018-12-31 | Disposition: A | Discharge status: Home / Self Care | Payer: Self-pay | Source: Ambulatory Visit | Attending: Internal Medicine | Admitting: Internal Medicine

## 2018-12-31 DIAGNOSIS — J84112 Idiopathic pulmonary fibrosis: Secondary | ICD-10-CM

## 2018-12-31 DIAGNOSIS — Z006 Encounter for examination for normal comparison and control in clinical research program: Secondary | ICD-10-CM

## 2018-12-31 MED ORDER — STUDY - FIBROGEN - PAMREVLUMAB OR PLACEBO 10 MG/ML IV INFUSION (PI-RAMASWAMY)
30.0000 mg/kg | Freq: Once | INTRAVENOUS | Status: AC
  Administered 2018-12-31: 3690 mg via INTRAVENOUS
  Filled 2018-12-31: qty 369

## 2018-12-31 NOTE — Research (Signed)
Title: FGCL-3019-091 (FibroGen Study) is a Phase 3, randomized, double-blind, placebo-controlled multicenter international study to evaluate evaluate the efficacy and safety of 30 mg/kg IV infusions of pamrevlumab administered every 3 weeks for 52 weeks as compared to placebo in subjects with Idiopathic Pulmonary Fibrosis. Primary end point is: change in FVC from baseline at week 52  Protocol #: FGCL-3019-091, Clinical Trials #: [id number] Sponsor: [email or web address]  ([place], [place], [place])  Protocol Version for 12/31/2018 amendment 2.0 dated 25SEP2019 Consent Version for 12/31/2018 [employer] IRB Approved version [id number] Revised [id number] Investigator Brochure Version for 12/31/2018  V17 dated 25OCT2018    Clinical Research Coordinator note : This visit for Subject Shane [name] with DOB: Feb 24, 1941 on 12/31/2018 for the above protocol is Visit/Encounter week 9 and is for purpose of research . The consent for this encounter is currently IRB approved. Subject expressed continued interest and consent in continuing as a study subject. Subject confirmed that there was  NO change in contact information (e.g. address, telephone, email). Subject thanked for participation in research and contribution to science.   In this visit 12/31/2018 the subject presents to clinic in what appears to be good health. The subject reports no complaints to the study coordinator at the visit. Via video chat, the PI, Dr. Chase Caller was present during discussion with the subject and the patient reported return of rash, located in the same location as last occurrence (left arm just above fold of elbow) stated the size of the rash was smaller "half dollar"size. Onset was [id number] with resolution on 12JAN2020. The subject stated taking a regimen of Benadryl 25mg . 1 tablet daily for 3 days  from [id number] to [id number]. The subject reported no other procedures or adverse events since last visit. No other changes in medication therapy stated  as well from subject. The PI ok'd continuation of treatment as per above reference protocol schedule. All pre infusion assessments were conducted as per above reference protocol. Treatment began at 10:23 and concluded at 11:23. All post infusion assessments were completed per protocol. Subject was observed for one hour post infusion. For further details on today's visit please refer to the subeject paper source binder.   RN on DUTY: Enedina Finner. Pharmacy personnel: Alveria Apley IV line placed: 09:10 IV Line Removed: 12:30 Patient left sight: 12:40  Signed by  T. Early Chars BS, [place], [place] Coordinator [place], [place] 3:45 [place] 12/31/2018

## 2019-01-01 ENCOUNTER — Ambulatory Visit (HOSPITAL_COMMUNITY): Payer: Medicare Other

## 2019-01-01 ENCOUNTER — Encounter (HOSPITAL_COMMUNITY)
Admission: RE | Admit: 2019-01-01 | Discharge: 2019-01-01 | Disposition: A | Discharge status: Home / Self Care | Payer: Self-pay | Source: Ambulatory Visit | Attending: Internal Medicine | Admitting: Internal Medicine

## 2019-01-03 ENCOUNTER — Ambulatory Visit (HOSPITAL_COMMUNITY): Payer: Medicare Other

## 2019-01-03 ENCOUNTER — Encounter (HOSPITAL_COMMUNITY)
Admission: RE | Admit: 2019-01-03 | Discharge: 2019-01-03 | Disposition: A | Discharge status: Home / Self Care | Payer: Self-pay | Source: Ambulatory Visit | Attending: Internal Medicine | Admitting: Internal Medicine

## 2019-01-08 ENCOUNTER — Encounter (HOSPITAL_COMMUNITY)
Admission: RE | Admit: 2019-01-08 | Discharge: 2019-01-08 | Disposition: A | Discharge status: Home / Self Care | Payer: Self-pay | Source: Ambulatory Visit | Attending: Internal Medicine | Admitting: Internal Medicine

## 2019-01-08 ENCOUNTER — Ambulatory Visit (HOSPITAL_COMMUNITY): Payer: Medicare Other

## 2019-01-08 DIAGNOSIS — K219 Gastro-esophageal reflux disease without esophagitis: Secondary | ICD-10-CM | POA: Insufficient documentation

## 2019-01-08 DIAGNOSIS — G4730 Sleep apnea, unspecified: Secondary | ICD-10-CM | POA: Insufficient documentation

## 2019-01-08 DIAGNOSIS — Z794 Long term (current) use of insulin: Secondary | ICD-10-CM | POA: Insufficient documentation

## 2019-01-08 DIAGNOSIS — N189 Chronic kidney disease, unspecified: Secondary | ICD-10-CM | POA: Insufficient documentation

## 2019-01-08 DIAGNOSIS — E785 Hyperlipidemia, unspecified: Secondary | ICD-10-CM | POA: Insufficient documentation

## 2019-01-08 DIAGNOSIS — I129 Hypertensive chronic kidney disease with stage 1 through stage 4 chronic kidney disease, or unspecified chronic kidney disease: Secondary | ICD-10-CM | POA: Insufficient documentation

## 2019-01-08 DIAGNOSIS — J84112 Idiopathic pulmonary fibrosis: Secondary | ICD-10-CM | POA: Insufficient documentation

## 2019-01-08 DIAGNOSIS — E1122 Type 2 diabetes mellitus with diabetic chronic kidney disease: Secondary | ICD-10-CM | POA: Insufficient documentation

## 2019-01-08 DIAGNOSIS — Z7982 Long term (current) use of aspirin: Secondary | ICD-10-CM | POA: Insufficient documentation

## 2019-01-08 DIAGNOSIS — Z79899 Other long term (current) drug therapy: Secondary | ICD-10-CM | POA: Insufficient documentation

## 2019-01-10 ENCOUNTER — Encounter (HOSPITAL_COMMUNITY)
Admission: RE | Admit: 2019-01-10 | Discharge: 2019-01-10 | Disposition: A | Discharge status: Home / Self Care | Payer: Self-pay | Source: Ambulatory Visit | Attending: Internal Medicine | Admitting: Internal Medicine

## 2019-01-10 ENCOUNTER — Ambulatory Visit (HOSPITAL_COMMUNITY): Payer: Medicare Other

## 2019-01-14 ENCOUNTER — Telehealth: Payer: Self-pay | Admitting: Internal Medicine

## 2019-01-14 NOTE — Telephone Encounter (Signed)
Pt has an appointment with [facility] on 01/23/2019.  ATC pt's wife, I did not receive an answer and I could not leave a message. Will route message over to [employer] on [facility].

## 2019-01-15 ENCOUNTER — Encounter (HOSPITAL_COMMUNITY)
Admission: RE | Admit: 2019-01-15 | Discharge: 2019-01-15 | Disposition: A | Discharge status: Home / Self Care | Payer: Self-pay | Source: Ambulatory Visit | Attending: Internal Medicine | Admitting: Internal Medicine

## 2019-01-15 ENCOUNTER — Ambulatory Visit (HOSPITAL_COMMUNITY): Payer: Medicare Other

## 2019-01-17 ENCOUNTER — Ambulatory Visit (HOSPITAL_COMMUNITY): Payer: Medicare Other

## 2019-01-17 ENCOUNTER — Encounter (HOSPITAL_COMMUNITY)
Admission: RE | Admit: 2019-01-17 | Discharge: 2019-01-17 | Disposition: A | Discharge status: Home / Self Care | Payer: Self-pay | Source: Ambulatory Visit | Attending: Internal Medicine | Admitting: Internal Medicine

## 2019-01-21 NOTE — Telephone Encounter (Signed)
Message previously routed to [facility]

## 2019-01-22 ENCOUNTER — Ambulatory Visit (HOSPITAL_COMMUNITY): Payer: Medicare Other

## 2019-01-22 ENCOUNTER — Encounter (HOSPITAL_COMMUNITY): Payer: Self-pay

## 2019-01-22 ENCOUNTER — Other Ambulatory Visit: Payer: Self-pay | Admitting: Internal Medicine

## 2019-01-22 ENCOUNTER — Ambulatory Visit (HOSPITAL_COMMUNITY): Admission: RE | Admit: 2019-01-22 | Payer: Medicare Other | Source: Ambulatory Visit

## 2019-01-22 ENCOUNTER — Other Ambulatory Visit (HOSPITAL_COMMUNITY): Payer: Self-pay | Admitting: Internal Medicine

## 2019-01-22 ENCOUNTER — Ambulatory Visit (HOSPITAL_COMMUNITY)
Admission: RE | Admit: 2019-01-22 | Discharge: 2019-01-22 | Disposition: A | Discharge status: Home / Self Care | Payer: Medicare Other | Source: Ambulatory Visit | Attending: Internal Medicine | Admitting: Internal Medicine

## 2019-01-22 ENCOUNTER — Telehealth: Payer: Self-pay | Admitting: Internal Medicine

## 2019-01-22 ENCOUNTER — Encounter (HOSPITAL_COMMUNITY): Payer: Medicare Other

## 2019-01-22 DIAGNOSIS — M7989 Other specified soft tissue disorders: Secondary | ICD-10-CM

## 2019-01-22 DIAGNOSIS — M79602 Pain in left arm: Secondary | ICD-10-CM

## 2019-01-22 DIAGNOSIS — M25522 Pain in left elbow: Secondary | ICD-10-CM | POA: Insufficient documentation

## 2019-01-22 DIAGNOSIS — M79671 Pain in right foot: Secondary | ICD-10-CM | POA: Insufficient documentation

## 2019-01-22 DIAGNOSIS — R609 Edema, unspecified: Secondary | ICD-10-CM

## 2019-01-22 MED ORDER — IOHEXOL 300 MG/ML  SOLN
100.0000 mL | Freq: Once | INTRAMUSCULAR | Status: AC | PRN
  Administered 2019-01-22: 100 mL via INTRAVENOUS

## 2019-01-22 NOTE — Telephone Encounter (Addendum)
Thanks.   Cam,  Please   - do AE log - unrelated to IP and please meet with me aabout this AE 01/23/19  - please text/calendar or tell me tomorrow tomorrow in huddle so we can meet  - please print this for my signature and filing in ISF  -  Please Plan to call and followup with subject/spouse on end of this week Friday 01/25/2019 for an update to the situation   Thanks   MR

## 2019-01-22 NOTE — Telephone Encounter (Signed)
Brought her attention by research coordinator Gordy Levan  that patient has developed a swelling in his left elbow.  He showed me pictures that the wife had sent him.  I then called primary care physician Dr. Burnard Bunting who reported that patient has a left elbow swelling that he believes is gout and is soft tissue swelling.  He thinks it might be related to the olecranon bursa otherwise.  It sounds like he initiated treatment for gout but the swelling has descended.  Primary care physician and myself do not think this is related to IV infusion few weeks ago.  However patient is scheduled for research infusion this week.  Given the new onset of this adverse event primary care physician has requested that the research infusion be on hold while he gets a CT scan/MRI of the elbow and tries to figure the lesion out.  I have communicated this with research coordinator    SIGNATURE    Dr. Brand Males, M.D., F.C.C.P, ACRP-CPI Pulmonary and Critical Care Medicine Research Investigator, [facility] [place] [facility] Staff Physician, [place] Director - Interstitial Lung Disease  Program  Pulmonary [facility] @ [place], [place], [address]  Pager: [phone], If no answer or between  15:00h - 7:00h: call [phone] Telephone: [phone]  1:50 PM 01/22/2019

## 2019-01-22 NOTE — Telephone Encounter (Signed)
Earlier today I received a call from Mrs. Terriquez, wife of mr. Elza Rafter. She informed me that beginning on Friday 14FEB2020 he noticed swelling and pain on his left arm that is still present today. She mentioned that Friday the 14th the source of the swelling was just above the left elbow, and as of this morning 18FEB2020 the swelling has descended down to just below his left elbow. She mentioned that from onset through the evening of 17FEB2020 the sight was warm to touch, but as of this morning was not warm to touch. The subject on 15FEB2020 started taking Dilaudid 2mg  1 tablet every 6 hours as needed for severe pain, and has also started Colchicine 0.6mg  1 tablet twice daily, Doxycycline 100mg  twice daily for 10 days both prescriptions were initiated on 17FEB2020. The wife also reported since 14FEB2020 the subject has alternated taking OTC Tylenol and ibuprofen for pain. The subject did report difficulties with lifting his left arm due to the pain and has also had difficulty sleeping as well. No Rash reported at the site. The wife also reported that this was not the first instance of an event of this nature. She states that approximately 2 years ago the subject experienced this on his knee, and once more around the end of December 2019/first of January 2020 on his right foot.  The wife sent photos of the subjects elbow and those images were shared with the PI Dr. Chase Caller. The coordinator discussed this information with the PI who subsequently discussed with the patient's PCP Dr. Burnard Bunting. It was the recommendation that the subject hold off on future infusions until there has been time to fully assess the area and have an understanding of what is going on with the subjects arm.   After the decision was made to hold the treatments the coordinator placed a return call to the wife of the subject and informed her that the week 12 visit scheduled for 19FEB2020 has been canceled and that we would hold  future visits until further notice. The wife verbalized understanding.

## 2019-01-23 ENCOUNTER — Inpatient Hospital Stay (HOSPITAL_COMMUNITY): Admission: RE | Admit: 2019-01-23 | Payer: Medicare Other | Source: Ambulatory Visit

## 2019-01-23 NOTE — Telephone Encounter (Signed)
This is a duplicate message. Please refer to phone note dated 01/22/2019 with documentation from Dr. Brantley Persons and Gordy Levan. Thanks!!

## 2019-01-24 ENCOUNTER — Ambulatory Visit (HOSPITAL_COMMUNITY): Payer: Medicare Other

## 2019-01-24 ENCOUNTER — Encounter (HOSPITAL_COMMUNITY): Payer: Medicare Other

## 2019-01-25 ENCOUNTER — Telehealth: Payer: Self-pay | Admitting: Internal Medicine

## 2019-01-25 NOTE — Telephone Encounter (Signed)
Took call 2:01 PM 01/25/2019  from PCP Burnard Bunting, MD regarding [name] .  Final dx baesd on imaging: Cellulitis per PCP  Current status: Slowly improving per PCP Cause: Definitely not related to IP or study procedure (based on chart review - most recent IV was on right wrist ) and after d/w PCP - PI opinion is not related to IP or study participation PI recommendation regarding IP  - ok to infuse on contralateral side which is right side . AVoid ipsilateral side which is the left side   Thanks   SIGNATURE    Dr. Brand Males, M.D., F.C.C.P, ACRP-CPI Pulmonary and Critical Care Medicine Research Investigator, [facility] [place] Staff Physician, [place] Director - Interstitial Lung Disease  Program  Pulmonary [facility] @ [place], [place], [address]  Pager: [phone], If no answer or between  15:00h - 7:00h: call [phone] Telephone: [phone]  2:04 PM 01/25/2019

## 2019-01-29 ENCOUNTER — Encounter (HOSPITAL_COMMUNITY): Payer: Medicare Other

## 2019-01-29 ENCOUNTER — Ambulatory Visit (HOSPITAL_COMMUNITY): Payer: Medicare Other

## 2019-01-30 ENCOUNTER — Encounter (HOSPITAL_COMMUNITY)
Admission: RE | Admit: 2019-01-30 | Discharge: 2019-01-30 | Disposition: A | Discharge status: Home / Self Care | Payer: Medicare Other | Source: Ambulatory Visit | Attending: Internal Medicine | Admitting: Internal Medicine

## 2019-01-30 DIAGNOSIS — Z006 Encounter for examination for normal comparison and control in clinical research program: Secondary | ICD-10-CM

## 2019-01-30 DIAGNOSIS — J84112 Idiopathic pulmonary fibrosis: Secondary | ICD-10-CM

## 2019-01-30 MED ORDER — STUDY - FIBROGEN - PAMREVLUMAB OR PLACEBO 10 MG/ML IV INFUSION (PI-RAMASWAMY)
30.0000 mg/kg | Freq: Once | INTRAVENOUS | Status: AC
  Administered 2019-01-30: 3700 mg via INTRAVENOUS
  Filled 2019-01-30: qty 370

## 2019-01-30 NOTE — Research (Signed)
Title: FGCL-3019-091 (FibroGen Study) is a Phase 3, randomized, double-blind, placebo-controlled multicenter international study to evaluate evaluate the efficacy and safety of 30 mg/kg IV infusions of pamrevlumab administered every 3 weeks for 52 weeks as compared to placebo in subjects with Idiopathic Pulmonary Fibrosis. Primary end point is: change in FVC from baseline at week 52  Protocol #: FGCL-3019-091, Clinical Trials #: [id number] Sponsor: [email or web address]  ([place], [place], [place])  Protocol Version for 01/30/2019 Amendment 2.0 dated 25SEP2019 Consent Version for 01/30/2019 [facility] IRB Approved version [id number] Revised [id number] Investigator Brochure Version for 01/30/2019 V17 dated 25OCT2018  Key Features of Pamrevlumab (FG-3019) the study drug: a recombinant fully human IgG kappa monoclonal antibody that binds to CTGF and is being developed for treatment of diseases in which tissue fibrosis has a major pathogenic role. In particular, pamrevlumab appears to disrupt a CTGF autocrine loop in mesenchymal cells like myofibroblasts that reduces their recruitment of leukocytes like macrophages, mast and dendritic cells via chemokine secretion. This disruption results in collapse of the cellular crosstalk that drives tissue remodeling.     Clinical Research Coordinator  note : This visit for Subject Shane [name] with DOB: 11-14-1941 on 01/30/2019 for the above protocol is Visit/Encounter week 12 and is for purpose of research . The consent for this encounter is currently IRB approved. Subject expressed continued interest and consent in continuing as a study subject. Subject confirmed that there was  no change in contact information (e.g. address, telephone, email). Subject thanked for participation in research and contribution to science.   In this visit 01/30/2019 the subject presented to infusion center and reported updates to his recent diagnosed AE of Cellulitis that began on 14FEB2020. The  patient reported that as of today's visit the swelling associated with this event has resolved as of 21FEB2020 however the pain still remains present. The subject and the study coordinator reviewed medications associated with this event and updates have been made to the subjects paper source records. 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 Dr. Chase Caller, via video conference evaluated the subject and post discussion with subject felt it was ok to proceed with study infusion today as planned over 30 minutes. This research coordinator has verified that the investigator is up to date with his training. All pre and post infusion assessments were conducted as per required per the above stated protocol. The subject tolerated the infusion well with no reported complaints. The subject was discharged from the clinic with no complaints. He will return in approximately 3 weeks for his week 15 visit. For further documentation of today's visit please refer to the subjects paper source binder.     RN on DUTY: Kristen R. Pharmacy personnel: Alveria Apley IV line site: Right Forearm IV line placed: 08:50 Infusion Start time: 09:35 Infusion End time: 10:06 IV Line Removed: 11:17 Patient left sight: 11:30  Signed by  T. Early Chars BS, [place], [place], [place] 2:03 PM 01/30/2019

## 2019-01-31 ENCOUNTER — Encounter (HOSPITAL_COMMUNITY): Payer: Medicare Other

## 2019-01-31 ENCOUNTER — Ambulatory Visit (HOSPITAL_COMMUNITY): Payer: Medicare Other

## 2019-02-04 ENCOUNTER — Telehealth: Payer: Self-pay | Admitting: Internal Medicine

## 2019-02-04 NOTE — Telephone Encounter (Signed)
Shane [name]  Labs from 01/30/2019 drawn for research show low mag 1.2mg %.   Plesae tell patient to start - mag oxide 400mg  daily - please send script  THanks  MR

## 2019-02-05 ENCOUNTER — Ambulatory Visit (HOSPITAL_COMMUNITY): Payer: Medicare Other

## 2019-02-05 ENCOUNTER — Encounter (HOSPITAL_COMMUNITY)
Admission: RE | Admit: 2019-02-05 | Discharge: 2019-02-05 | Disposition: A | Discharge status: Home / Self Care | Payer: Self-pay | Source: Ambulatory Visit | Attending: Internal Medicine | Admitting: Internal Medicine

## 2019-02-05 DIAGNOSIS — Z7982 Long term (current) use of aspirin: Secondary | ICD-10-CM | POA: Insufficient documentation

## 2019-02-05 DIAGNOSIS — E785 Hyperlipidemia, unspecified: Secondary | ICD-10-CM | POA: Insufficient documentation

## 2019-02-05 DIAGNOSIS — G4730 Sleep apnea, unspecified: Secondary | ICD-10-CM | POA: Insufficient documentation

## 2019-02-05 DIAGNOSIS — I129 Hypertensive chronic kidney disease with stage 1 through stage 4 chronic kidney disease, or unspecified chronic kidney disease: Secondary | ICD-10-CM | POA: Insufficient documentation

## 2019-02-05 DIAGNOSIS — E1122 Type 2 diabetes mellitus with diabetic chronic kidney disease: Secondary | ICD-10-CM | POA: Insufficient documentation

## 2019-02-05 DIAGNOSIS — K219 Gastro-esophageal reflux disease without esophagitis: Secondary | ICD-10-CM | POA: Insufficient documentation

## 2019-02-05 DIAGNOSIS — N189 Chronic kidney disease, unspecified: Secondary | ICD-10-CM | POA: Insufficient documentation

## 2019-02-05 DIAGNOSIS — Z79899 Other long term (current) drug therapy: Secondary | ICD-10-CM | POA: Insufficient documentation

## 2019-02-05 DIAGNOSIS — Z794 Long term (current) use of insulin: Secondary | ICD-10-CM | POA: Insufficient documentation

## 2019-02-05 DIAGNOSIS — J84112 Idiopathic pulmonary fibrosis: Secondary | ICD-10-CM | POA: Insufficient documentation

## 2019-02-05 MED ORDER — MAGNESIUM OXIDE 400 MG PO CAPS: 400.0000 mg | ORAL_CAPSULE | Freq: Every day | ORAL | 6 refills | Status: DC

## 2019-02-05 NOTE — Telephone Encounter (Signed)
Called and spoke with pt's wife [name] letting her know that pt's magnesium was low when research labs were done and due to that, MR wants pt to start on magnesium daily. [name] expressed understanding. Med has been sent in to pt's preferred pharmacy. Nothing further needed.

## 2019-02-07 ENCOUNTER — Ambulatory Visit (HOSPITAL_COMMUNITY): Payer: Medicare Other

## 2019-02-07 ENCOUNTER — Encounter (HOSPITAL_COMMUNITY)
Admission: RE | Admit: 2019-02-07 | Discharge: 2019-02-07 | Disposition: A | Discharge status: Home / Self Care | Payer: Self-pay | Source: Ambulatory Visit | Attending: Internal Medicine | Admitting: Internal Medicine

## 2019-02-12 ENCOUNTER — Ambulatory Visit (HOSPITAL_COMMUNITY): Payer: Medicare Other

## 2019-02-12 ENCOUNTER — Encounter (HOSPITAL_COMMUNITY)
Admission: RE | Admit: 2019-02-12 | Discharge: 2019-02-12 | Disposition: A | Discharge status: Home / Self Care | Payer: Self-pay | Source: Ambulatory Visit | Attending: Internal Medicine | Admitting: Internal Medicine

## 2019-02-14 ENCOUNTER — Other Ambulatory Visit: Payer: Self-pay

## 2019-02-14 ENCOUNTER — Encounter (HOSPITAL_COMMUNITY)
Admission: RE | Admit: 2019-02-14 | Discharge: 2019-02-14 | Disposition: A | Discharge status: Home / Self Care | Payer: Self-pay | Source: Ambulatory Visit | Attending: Internal Medicine | Admitting: Internal Medicine

## 2019-02-14 ENCOUNTER — Ambulatory Visit (HOSPITAL_COMMUNITY): Payer: Medicare Other

## 2019-02-15 ENCOUNTER — Encounter (HOSPITAL_COMMUNITY)
Admission: RE | Admit: 2019-02-15 | Discharge: 2019-02-15 | Disposition: A | Discharge status: Home / Self Care | Payer: Self-pay | Source: Ambulatory Visit | Attending: Internal Medicine | Admitting: Internal Medicine

## 2019-02-15 DIAGNOSIS — Z006 Encounter for examination for normal comparison and control in clinical research program: Secondary | ICD-10-CM

## 2019-02-15 DIAGNOSIS — J84112 Idiopathic pulmonary fibrosis: Secondary | ICD-10-CM

## 2019-02-15 MED ORDER — STUDY - FIBROGEN - PAMREVLUMAB OR PLACEBO 10 MG/ML IV INFUSION (PI-RAMASWAMY)
30.0000 mg/kg | Freq: Once | INTRAVENOUS | Status: AC
  Administered 2019-02-15: 3700 mg via INTRAVENOUS

## 2019-02-15 NOTE — Research (Addendum)
Title: FGCL-3019-091 (FibroGen Study) is a Phase 3, randomized, double-blind, placebo-controlled multicenter international study to evaluate evaluate the efficacy and safety of 30 mg/kg IV infusions of pamrevlumab administered every 3 weeks for 52 weeks as compared to placebo in subjects with Idiopathic Pulmonary Fibrosis. Primary end point is: change in FVC from baseline at week 52  Protocol #: [id number], Clinical Trials #: [id number] Sponsor: [email or web address]  ([place], [place], [place])  Protocol Version for 02/15/2019 Amendment 2.0 dated 25SEP2019 Consent Version for 02/15/2019 [place] IRB Approved version [id number] Revised [id number] Investigator Brochure Version for 02/15/2019 V17 dated 25OCT2018  Key Features of Pamrevlumab (FG-3019) the study drug: a recombinant fully human IgG kappa monoclonal antibody that binds to CTGF and is being developed for treatment of diseases in which tissue fibrosis has a major pathogenic role. In particular, pamrevlumab appears to disrupt a CTGF autocrine loop in mesenchymal cells like myofibroblasts that reduces their recruitment of leukocytes like macrophages, mast and dendritic cells via chemokine secretion. This disruption results in collapse of the cellular crosstalk that drives tissue remodeling.    Clinical Research Coordinator note : This visit for Subject Shane [name] with DOB: May 26, 1941 on 02/15/2019 for the above protocol is Visit/Encounter # week 15 and is for purpose of research . The consent for this encounter is currently IRB approved. The subject expressed continued interest and consent in continuing as a study subject. Subject confirmed that there was no change in contact information (e.g. address, telephone, email). Subject thanked for participation in research and contribution to science.   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  The PI remained readily available throughout the entirety of the visit. This research coordinator has verified that the investigator is up to date with his training. The subject reports resolution of his cellulitis of his left elbow as of today [id number]. The subject also reported additional medication of Magnesium Oxide 400mg  capsules taken daily which started on 03MAR2020. The subject has no other AEs, Conmeds, or Non drug procedures to report at this time. The PI felt the subject was suitable to continue with today's therapy as planned. The subject will be infused over 30 minutes and will have 30 minutes of post infusion observation. All pre and post infusion assessments were completed as per the above stated protocol requirements. For further documentation on today's visit please refer to the subject paper source binder.  Additional details  RN on DUTY: [place] personnel: Alveria Apley IV line site: Right Forearm IV line placed: 09:20 Infusion Start time: 10:05 Infusion End time: 10:34 IV Line Removed:11:18 Patient left sight:11:25  Signed by  T. Early Chars BS, [place], [place], [place] 11:44 AM 02/15/2019

## 2019-02-18 ENCOUNTER — Telehealth (HOSPITAL_COMMUNITY): Payer: Self-pay

## 2019-02-19 ENCOUNTER — Ambulatory Visit (HOSPITAL_COMMUNITY): Payer: Medicare Other

## 2019-02-19 ENCOUNTER — Encounter (HOSPITAL_COMMUNITY): Payer: Self-pay

## 2019-02-21 ENCOUNTER — Encounter (HOSPITAL_COMMUNITY): Payer: Self-pay

## 2019-02-21 ENCOUNTER — Ambulatory Visit (HOSPITAL_COMMUNITY): Payer: Medicare Other

## 2019-02-26 ENCOUNTER — Encounter (HOSPITAL_COMMUNITY): Payer: Self-pay

## 2019-02-26 ENCOUNTER — Telehealth (HOSPITAL_COMMUNITY): Payer: Self-pay | Admitting: *Deleted

## 2019-02-26 ENCOUNTER — Ambulatory Visit (HOSPITAL_COMMUNITY): Payer: Medicare Other

## 2019-02-27 ENCOUNTER — Other Ambulatory Visit: Payer: Self-pay

## 2019-02-27 ENCOUNTER — Ambulatory Visit (HOSPITAL_COMMUNITY)
Admission: RE | Admit: 2019-02-27 | Discharge: 2019-02-27 | Disposition: A | Discharge status: Home / Self Care | Payer: Medicare Other | Source: Ambulatory Visit | Attending: Internal Medicine | Admitting: Internal Medicine

## 2019-02-27 DIAGNOSIS — J84112 Idiopathic pulmonary fibrosis: Secondary | ICD-10-CM | POA: Insufficient documentation

## 2019-02-27 DIAGNOSIS — Z006 Encounter for examination for normal comparison and control in clinical research program: Secondary | ICD-10-CM

## 2019-02-27 MED ORDER — STUDY - FIBROGEN - PAMREVLUMAB OR PLACEBO 10 MG/ML IV INFUSION (PI-RAMASWAMY)
30.0000 mg/kg | Freq: Once | INTRAVENOUS | Status: AC
  Administered 2019-02-27: 3700 mg via INTRAVENOUS

## 2019-02-27 NOTE — Research (Signed)
Title: FGCL-3019-091 (FibroGen Study) is a Phase 3, randomized, double-blind, placebo-controlled multicenter international study to evaluate evaluate the efficacy and safety of 30 mg/kg IV infusions of pamrevlumab administered every 3 weeks for 52 weeks as compared to placebo in subjects with Idiopathic Pulmonary Fibrosis. Primary end point is: change in FVC from baseline at week 52  Protocol #: [id number], Clinical Trials #: [id number] Sponsor: [email or web address]  ([place], [place], [place])  Protocol Version for 02/27/2019 date of  Amendment 2.0 dated 25SEP2019 Consent Version for 02/27/2019 date [facility] IRB Approved version [phone] revised [id number] Investigator Brochure Version for 02/27/2019 date V17 dated 25OCT2018  Key Features of Pamrevlumab (FG-3019) the study drug: a recombinant fully human IgG kappa monoclonal antibody that binds to CTGF and is being developed for treatment of diseases in which tissue fibrosis has a major pathogenic role. In particular, pamrevlumab appears to disrupt a CTGF autocrine loop in mesenchymal cells like myofibroblasts that reduces their recruitment of leukocytes like macrophages, mast and dendritic cells via chemokine secretion. This disruption results in collapse of the cellular crosstalk that drives tissue remodeling.     Clinical Research Coordinator note : This visit for Subject Shane [name] with DOB: 1941/06/14 on 02/27/2019 for the above protocol is Visit/Encounter # week 18  and is for purpose of research . The consent for this encounter is  currently IRB approved. The Subject expressed continued interest and consent in continuing as a study subject. Subject confirmed that there was  no change in contact information (e.g. address, telephone, email). Subject thanked for participation in research and contribution to science.   Prior to the subject's visit the subject was contacted by the study coordinator Early Chars for COVID-19 pre visit screening  questions. The subject passed screening questions and was ok to come to clinic for the visit. For details on the questions or responses please refer to the subjects paper source binder.  In this visit 02/27/2019 the subject returned to clinic and reported no changes in any medications. The subject reported no adverse events nor non-drug related therapies since the last infusion visit. Dr. Geoffery Spruce conferenced with the subject via video and reviewed the subject status over the past 3 weeks and after consult agreed that subject could continue with the treatment as planned. All pre and post infusion assessments were conducted as per required by above stated protocol including pre treatment PFT that was conducted by [place] Bing, study coordinator. The subject was discharged from the clinic with no complaints. The subject will return in about 3 weeks for their week 21 visit.  Additional details  RN on DUTY:Lisa M. Pharmacy personnel: [place] IV line site: Right Forearm IV line placed:09:35 Infusion Start time: 10:29 Infusion End time:: 10:58 IV Line Removed::11:38 Patient left sight::11:40  Signed by  T. Early Chars BS, [place], [place], [place] 10:33 AM 02/27/2019

## 2019-02-28 ENCOUNTER — Ambulatory Visit (HOSPITAL_COMMUNITY): Payer: Medicare Other

## 2019-02-28 ENCOUNTER — Encounter (HOSPITAL_COMMUNITY): Payer: Self-pay

## 2019-03-05 ENCOUNTER — Encounter (HOSPITAL_COMMUNITY): Payer: Self-pay

## 2019-03-05 ENCOUNTER — Ambulatory Visit (HOSPITAL_COMMUNITY): Payer: Medicare Other

## 2019-03-07 ENCOUNTER — Ambulatory Visit (HOSPITAL_COMMUNITY): Payer: Medicare Other

## 2019-03-07 ENCOUNTER — Encounter (HOSPITAL_COMMUNITY): Payer: Medicare Other

## 2019-03-08 ENCOUNTER — Encounter (HOSPITAL_COMMUNITY): Payer: Medicare Other

## 2019-03-11 ENCOUNTER — Telehealth: Payer: Self-pay

## 2019-03-11 NOTE — Telephone Encounter (Addendum)
Late Entry For [id number]  FGCL-3019-091  Subjects' wife reached out to me (study coordinator) for the Fibrogen trial on behalf of Subject [id number]. The subject mentioned that he had been watching the news and was inquiring rather or not Dr. Chase Caller would call in antivirus prescriptions for the subject just in case as recommended by the news media outlets. I relayed the question to Dr. Mercy Moore and he denied the need for that. Dr. Chase Caller did not feel the combination therapy of Z-PAK and malaria drug will be beneficial to the treatment of COVID-19. He stated that the combination of drugs have been known to cause harm to the heart and liver. He stated the best course of action for prevention and safety was to continue on the steps that have been in place by the subject of social distancing. Frequent hand washing, no touching of the face and limiting travel and interaction with others to the highest extent possible. The message was relayed to the subject through his wife and they both expressed understanding. The subject also wanted to confirm the next infusion date which is scheduled for [id number] at 09:00. The subject was thanked for her time and the call was ended.   Shane [name]. Late entry for [id number]

## 2019-03-12 ENCOUNTER — Ambulatory Visit (HOSPITAL_COMMUNITY): Payer: Medicare Other

## 2019-03-12 ENCOUNTER — Encounter (HOSPITAL_COMMUNITY): Payer: Medicare Other

## 2019-03-14 ENCOUNTER — Ambulatory Visit (HOSPITAL_COMMUNITY): Payer: Medicare Other

## 2019-03-14 ENCOUNTER — Encounter (HOSPITAL_COMMUNITY): Payer: Medicare Other

## 2019-03-19 ENCOUNTER — Encounter (HOSPITAL_COMMUNITY): Payer: Medicare Other

## 2019-03-19 ENCOUNTER — Telehealth (HOSPITAL_COMMUNITY): Payer: Self-pay | Admitting: *Deleted

## 2019-03-19 ENCOUNTER — Ambulatory Visit (HOSPITAL_COMMUNITY): Payer: Medicare Other

## 2019-03-20 ENCOUNTER — Other Ambulatory Visit: Payer: Self-pay

## 2019-03-20 ENCOUNTER — Telehealth: Payer: Self-pay

## 2019-03-20 NOTE — Telephone Encounter (Signed)
[phone]  Today [id number] I placed a call to Mr. Shane [name], Subject who is currently participating in the Fibrogen clinical trial to confirm eligibility to come into clinic for his visit. This is done by asking him the pre visit COVID-19 screening questions. The subject answered all COVID screening questions in the negative and has passed prescreening guidelines to continue with visit as planned on 16APR2020. Subject was thanked for his time on the call and call was ended.  [name].

## 2019-03-21 ENCOUNTER — Ambulatory Visit (HOSPITAL_COMMUNITY): Payer: Medicare Other

## 2019-03-21 ENCOUNTER — Encounter (HOSPITAL_COMMUNITY): Payer: Medicare Other

## 2019-03-21 ENCOUNTER — Encounter (HOSPITAL_COMMUNITY)
Admission: RE | Admit: 2019-03-21 | Discharge: 2019-03-21 | Disposition: A | Discharge status: Home / Self Care | Payer: Medicare Other | Source: Ambulatory Visit | Attending: Internal Medicine | Admitting: Internal Medicine

## 2019-03-21 ENCOUNTER — Telehealth: Payer: Self-pay

## 2019-03-21 DIAGNOSIS — Z006 Encounter for examination for normal comparison and control in clinical research program: Secondary | ICD-10-CM

## 2019-03-21 DIAGNOSIS — J84112 Idiopathic pulmonary fibrosis: Secondary | ICD-10-CM

## 2019-03-21 MED ORDER — STUDY - FIBROGEN - PAMREVLUMAB OR PLACEBO 10 MG/ML IV INFUSION (PI-RAMASWAMY)
30.0000 mg/kg | Freq: Once | INTRAVENOUS | Status: AC
  Administered 2019-03-21: 3700 mg via INTRAVENOUS

## 2019-03-21 NOTE — Telephone Encounter (Signed)
FGCL-3019-091  During the pre infusion assessment with the subject on 16APR2020, he reported another occurrence of the intermittent syncope. This onset was Friday 10APR2020 and lasted approximately 4 hours. This occurrence was discussed with Dr. Chase Caller via video conference and it was felt that He (Dr. Alfonso Patten) and Dr. [name] should discuss a potential reduction or  discontinuation of his Blood pressure medicines. Dr. Chase Caller will discuss this with Dr. [name] during the week of 20APR2020.   [name].

## 2019-03-21 NOTE — Research (Signed)
IPF PRO Registry Purpose: To collect data and biological samples that will support future research studies.  Registry will describe current approaches to diagnosis and treatment of IPF, analyze participant characteristics to describe the natural history of the disease, assess quality of life, describe participants interactions with the health care system, describe IPF treatment practices across multiple institutions, and utilize biological samples linked to well characterized IPF participants to identify disease biomarkers  Clinical Research officer, political party / Research RN note : This visit for Subject Shane [name] with DOB: [birth year] on 03/21/2019 for the above protocol is Visit/Encounter # 24 month follow up and is for purpose of research . The consent for this encounter is under Protocol Version date July 03, 2017 and IS currently IRB approved. The subject expressed continued interest and consent in continuing as a study subject. Subject confirmed that there was  no change in contact information (e.g. address, telephone, email). Subject thanked for participation in research and contribution to science.   In this visit 03/21/2019 the subject completed his Patient Reported Outcomes questionnaire packet and had blood collected as per required by the above stated protocol. The subject will return to clinic in approximately six months for the next scheduled follow up visit.   Signed by  T. Early Chars BS, [place], [place], [place] 10:47 AM 03/21/2019

## 2019-03-21 NOTE — Research (Signed)
Title: FGCL-3019-091 (FibroGen Study) is a Phase 3, randomized, double-blind, placebo-controlled multicenter international study to evaluate evaluate the efficacy and safety of 30 mg/kg IV infusions of pamrevlumab administered every 3 weeks for 52 weeks as compared to placebo in subjects with Idiopathic Pulmonary Fibrosis. Primary end point is: change in FVC from baseline at week 52  Protocol #: FGCL-3019-091, Clinical Trials #: [id number] Sponsor: [email or web address]  ([place], [place], [place])  Protocol Version for 03/21/2019 date is Amendment 2.0 dated 25SEP2019 Consent Version for 03/21/2019 date is [facility] IRB Approved version [id number] revised [id number] Investigator Brochure Version for 03/21/2019 date is V17 dated 25OCT2018   Key Features of Pamrevlumab (FG-3019) the study drug: a recombinant fully human IgG kappa monoclonal antibody that binds to CTGF and is being developed for treatment of diseases in which tissue fibrosis has a major pathogenic role. In particular, pamrevlumab appears to disrupt a CTGF autocrine loop in mesenchymal cells like myofibroblasts that reduces their recruitment of leukocytes like macrophages, mast and dendritic cells via chemokine secretion. This disruption results in collapse of the cellular crosstalk that drives tissue remodeling.   Prior to the subject's visit the subject was contacted by the study coordinator Early Chars for COVID-19 pre visit screening questions. The subject passed screening questions and was ok to come to clinic for the visit. For details on the questions or responses please refer to the subjects paper source binder.  Clinical Research Coordinator note : This visit for Subject Shane [name] with DOB: 06-04-1941 on 03/21/2019 for the above protocol is Visit/Encounter # week 21  and is for purpose of research . The consent for this encounter is currently IRB approved. The subject expressed continued interest and consent in continuing as a study  subject. Subject confirmed that there was no change in contact information (e.g. address, telephone, email). Subject thanked for participation in research and contribution to science.   In this visit 03/21/2019 the subject will be evaluated by investigator Dr. Chase Caller via video conference. This research coordinator has verified that the investigator is up to date with his training. In this visit the subject returned to clinic reporting no changes in medications. Via video conference the subject and Dr. Chase Caller reviewed the subject status of the last 3 weeks since the last visit. In that time the patient reported that on 10APR2020 the subject underwent another episode of Syncope. This has been an ongoing intermittent event for the subject dating back greater than a year per subject reporting. The subject stated it lasted approximately 4 hours and that EMS was called to the scene. During the event EMS observed the subject and assessed his vitals, performed an EKG and gave him IV fluids. The subject reported that he recovered from the event at home with the treatment of EMS personnel and was not transported to the ED. The entirety of this event and knowledge of the event is based on subject reporting alone. The study coordinator has not obtained any documentation of this event. Therefor there is no supporting EMS documentation at this time. However the subject states after IV fluids and time the subject recovered and has had no subsequent events of syncope since [id number]. Dr. Chase Caller will reach out to Dr. [name] to discuss possible changes in the subjects blood pressure therapy. Additionally the subject presented with Grade 1 bruising on his right forearm, the subject stated this started on 10APR2020 and is still noticeably present today. This is felt to be from the IV treatment of Fluids given by the  EMS team while they were treating the syncope. Dr. Chase Caller felt that the subject was ok to continue with  treatment as scheduled and the subject underwent IP administration as planned. All pre and post infusion assessments were completed as per required per above stated protocol. The subject will return in approximately 3 weeks for his week 24 visit.  Additional Details RN on [place] personnel: [place] IV line site: Right Forearm IV line placed:09:38 Infusion Start time:10:27 Infusion End time:: 10:56 IV Line Removed::11:30 Patient left sight::11:35   Pre infusion Vitals  Temp: 98.5  F  BP-Left arm: 159/71  Pulse: 63 bpm  RR: 16 bpm  SpO2: 99% on 0 L/min  Post infusion Vitals  Temp: 97.9 F  BP-left arm: 157/76  Pulse: 62bpm  RR: 20 bpm  Spo2: 100% on 0 L/min  Signed by  T. Early Chars BS, [facility], [place], [place] 12:39 PM 03/21/2019

## 2019-03-26 ENCOUNTER — Encounter (HOSPITAL_COMMUNITY): Payer: Medicare Other

## 2019-03-26 ENCOUNTER — Ambulatory Visit (HOSPITAL_COMMUNITY): Payer: Medicare Other

## 2019-03-28 ENCOUNTER — Ambulatory Visit (HOSPITAL_COMMUNITY): Payer: Medicare Other

## 2019-03-28 ENCOUNTER — Encounter (HOSPITAL_COMMUNITY): Payer: Medicare Other

## 2019-03-29 ENCOUNTER — Telehealth: Payer: Self-pay

## 2019-03-29 NOTE — Telephone Encounter (Signed)
FGCL-3019-091  Today I spoke with Mr. Shane [name] to inform him that his next study visit will be on Friday May 8th at [facility]. The subject verbalized understanding; the subject was thanked for his time and the call was ended.  Early Chars.

## 2019-04-02 ENCOUNTER — Ambulatory Visit (HOSPITAL_COMMUNITY): Payer: Medicare Other

## 2019-04-02 ENCOUNTER — Encounter (HOSPITAL_COMMUNITY): Payer: Medicare Other

## 2019-04-04 ENCOUNTER — Ambulatory Visit (HOSPITAL_COMMUNITY): Payer: Medicare Other

## 2019-04-04 ENCOUNTER — Encounter (HOSPITAL_COMMUNITY): Payer: Medicare Other

## 2019-04-09 ENCOUNTER — Ambulatory Visit (HOSPITAL_COMMUNITY): Payer: Medicare Other

## 2019-04-09 ENCOUNTER — Encounter (HOSPITAL_COMMUNITY): Payer: Self-pay

## 2019-04-11 ENCOUNTER — Ambulatory Visit (HOSPITAL_COMMUNITY): Payer: Medicare Other

## 2019-04-11 ENCOUNTER — Encounter (HOSPITAL_COMMUNITY): Payer: Self-pay

## 2019-04-12 ENCOUNTER — Ambulatory Visit (HOSPITAL_COMMUNITY)
Admission: RE | Admit: 2019-04-12 | Discharge: 2019-04-12 | Disposition: A | Discharge status: Home / Self Care | Payer: Medicare Other | Source: Ambulatory Visit | Attending: Internal Medicine | Admitting: Internal Medicine

## 2019-04-12 ENCOUNTER — Other Ambulatory Visit: Payer: Self-pay

## 2019-04-12 DIAGNOSIS — Z006 Encounter for examination for normal comparison and control in clinical research program: Secondary | ICD-10-CM | POA: Diagnosis present

## 2019-04-12 MED ORDER — STUDY - FIBROGEN - PAMREVLUMAB OR PLACEBO 10 MG/ML IV INFUSION (PI-RAMASWAMY)
30.0000 mg/kg | Freq: Once | INTRAVENOUS | Status: AC
  Administered 2019-04-12: 3700 mg via INTRAVENOUS
  Filled 2019-04-12: qty 370

## 2019-04-12 NOTE — Research (Signed)
Late Entry for [id number]   Title: FGCL-3019-091 (FibroGen Study) is a Phase 3, randomized, double-blind, placebo-controlled multicenter international study to evaluate evaluate the efficacy and safety of 30 mg/kg IV infusions of pamrevlumab administered every 3 weeks for 52 weeks as compared to placebo in subjects with Idiopathic Pulmonary Fibrosis. Primary end point is: change in FVC from baseline at week 52  Protocol #: FGCL-3019-091, Clinical Trials #: [id number] Sponsor: [email or web address]  ([place], [place], [place])  Protocol Version for date of  04/12/2019  Is Amendment 3 Dated [id number] Consent Version for date of  04/12/2019 is [place] [facility] IRB Approved Version 10 Apr2020, Revised 15 Mar 2019 Investigator Brochure Version for date of  04/12/2019 is Version 17 dated 25OCT2018   Key Features of Pamrevlumab (FG-3019) the study drug: a recombinant fully human IgG kappa monoclonal antibody that binds to CTGF and is being developed for treatment of diseases in which tissue fibrosis has a major pathogenic role. In particular, pamrevlumab appears to disrupt a CTGF autocrine loop in mesenchymal cells like myofibroblasts that reduces their recruitment of leukocytes like macrophages, mast and dendritic cells via chemokine secretion. This disruption results in collapse of the cellular crosstalk that drives tissue remodeling.  Prior to the conduct of this visit, the subject was contacted by Research team member Bog[place] Bing on [phone], and the subject was pre screened for the [facility] pre study visit COVID-19 exposure questionnaire. The subject passed screening and was cleared to report for study visit.  Clinical Research Coordinator I: This visit for Subject Shane [name] with DOB: December 08, 1940 on 04/12/2019 for the above protocol is Visit/Encounter # week 24  and is for purpose of research . The consent for this encounter is  currently IRB approved. The subject expressed continued interest and consent in continuing  as a study subject. Subject confirmed that there was no change in contact information (e.g. address, telephone, email). Subject thanked for participation in research and contribution to science.   In this visit 04/12/2019 the subject will be evaluated by investigator named Dr. Brand Males  . This research coordinator has verified that the investigator is up to date with his training. In this visit the subject presents with no complaints. States that he has not had any new AEs or changes to any medications to report. The subject did however report resolution to his right forearm bruising as of 20APR2020. Subject completed all pre infusion assessments, including weight check, Blood collection, PFT, and Patient reported outcomes questionnaires. PI Dr. Chase Caller did confirm with study coordinator that subject was ok to continue with today's infusion as planned. Subject tolerated treatment well with no complaints. Post infusion vitals completed and observation period completed with continued tolerance and no adverse events noted. Subject was discharged from facility and will return to clinic in approximately 3 weeks for his next scheduled study visit.  Additional details  1. Because this visit is a key visit of study reconsent the PI Dr. Brand Males was present for the re consenting process. This PI was readily available for this visit. The subject had all questions answered to his satisfaction and agreed to sign consent and continue as a research subject. For further details on subjects consent please refer the the subject paper source binder    Additional Details- Continued RN on [place] IV line site: Right Wrist IV line placed:09:44 Infusion Start time:11:46 Infusion End time::12:20 IV Line Removed::12:58 Patient left sight::13:05     Pre infusion Vitals  Temp: 98.3 F  BP-left arm: 150/65  Pulse: 62bpm  RR: 18 bpm  Spo2: 100%  on 0 L/min    Post infusion Vitals  Temp: 98.6  F  BP-Left arm: 152/75  Pulse: 63 bpm  RR: 16 bpm  SpO2: 100% on 0 L/min    Signed by  T. Early Chars BS, [place], [place] Coordinator [place], [place] 11:03 AM 04/12/2019   - Late Entry TCM [phone]

## 2019-04-16 ENCOUNTER — Encounter (HOSPITAL_COMMUNITY): Payer: Self-pay

## 2019-04-16 ENCOUNTER — Ambulatory Visit (HOSPITAL_COMMUNITY): Payer: Medicare Other

## 2019-04-18 ENCOUNTER — Ambulatory Visit (HOSPITAL_COMMUNITY): Payer: Medicare Other

## 2019-04-18 ENCOUNTER — Encounter (HOSPITAL_COMMUNITY): Payer: Self-pay

## 2019-04-23 ENCOUNTER — Encounter (HOSPITAL_COMMUNITY): Payer: Self-pay

## 2019-04-23 ENCOUNTER — Ambulatory Visit (HOSPITAL_COMMUNITY): Payer: Medicare Other

## 2019-04-25 ENCOUNTER — Ambulatory Visit (HOSPITAL_COMMUNITY): Payer: Medicare Other

## 2019-04-25 ENCOUNTER — Encounter (HOSPITAL_COMMUNITY): Payer: Self-pay

## 2019-04-30 ENCOUNTER — Ambulatory Visit (HOSPITAL_COMMUNITY): Payer: Medicare Other

## 2019-04-30 ENCOUNTER — Encounter (HOSPITAL_COMMUNITY): Payer: Self-pay

## 2019-05-02 ENCOUNTER — Ambulatory Visit (HOSPITAL_COMMUNITY): Payer: Medicare Other

## 2019-05-02 ENCOUNTER — Other Ambulatory Visit: Payer: Self-pay

## 2019-05-02 ENCOUNTER — Encounter (HOSPITAL_COMMUNITY): Payer: Self-pay

## 2019-05-02 DIAGNOSIS — J84112 Idiopathic pulmonary fibrosis: Secondary | ICD-10-CM

## 2019-05-02 DIAGNOSIS — Z006 Encounter for examination for normal comparison and control in clinical research program: Secondary | ICD-10-CM

## 2019-05-03 ENCOUNTER — Encounter (HOSPITAL_COMMUNITY): Payer: Medicare Other

## 2019-05-07 ENCOUNTER — Ambulatory Visit (HOSPITAL_COMMUNITY): Payer: Medicare Other

## 2019-05-07 ENCOUNTER — Encounter (HOSPITAL_COMMUNITY): Payer: Self-pay

## 2019-05-08 ENCOUNTER — Telehealth: Payer: Self-pay | Admitting: *Deleted

## 2019-05-08 NOTE — Telephone Encounter (Signed)

## 2019-05-09 ENCOUNTER — Ambulatory Visit (HOSPITAL_COMMUNITY): Payer: Medicare Other

## 2019-05-09 ENCOUNTER — Encounter (HOSPITAL_COMMUNITY): Payer: Self-pay

## 2019-05-10 ENCOUNTER — Other Ambulatory Visit: Payer: Self-pay

## 2019-05-10 ENCOUNTER — Ambulatory Visit (INDEPENDENT_AMBULATORY_CARE_PROVIDER_SITE_OTHER)
Admission: RE | Admit: 2019-05-10 | Discharge: 2019-05-10 | Disposition: A | Discharge status: Home / Self Care | Payer: Self-pay | Source: Ambulatory Visit | Attending: Internal Medicine | Admitting: Internal Medicine

## 2019-05-10 DIAGNOSIS — Z006 Encounter for examination for normal comparison and control in clinical research program: Secondary | ICD-10-CM

## 2019-05-10 DIAGNOSIS — J84112 Idiopathic pulmonary fibrosis: Secondary | ICD-10-CM

## 2019-05-14 ENCOUNTER — Other Ambulatory Visit: Payer: Self-pay

## 2019-05-14 ENCOUNTER — Encounter (HOSPITAL_COMMUNITY): Payer: Self-pay

## 2019-05-14 ENCOUNTER — Ambulatory Visit (HOSPITAL_COMMUNITY)
Admission: RE | Admit: 2019-05-14 | Discharge: 2019-05-14 | Disposition: A | Discharge status: Home / Self Care | Payer: Medicare Other | Source: Ambulatory Visit | Attending: Internal Medicine | Admitting: Internal Medicine

## 2019-05-14 ENCOUNTER — Ambulatory Visit (HOSPITAL_COMMUNITY): Payer: Medicare Other

## 2019-05-14 ENCOUNTER — Encounter: Payer: Medicare Other | Admitting: *Deleted

## 2019-05-14 DIAGNOSIS — Z006 Encounter for examination for normal comparison and control in clinical research program: Secondary | ICD-10-CM | POA: Insufficient documentation

## 2019-05-14 DIAGNOSIS — J84112 Idiopathic pulmonary fibrosis: Secondary | ICD-10-CM

## 2019-05-14 MED ORDER — STUDY - FIBROGEN - PAMREVLUMAB OR PLACEBO 10 MG/ML IV INFUSION (PI-RAMASWAMY)
30.0000 mg/kg | Freq: Once | INTRAVENOUS | Status: AC
  Administered 2019-05-14: 3700 mg via INTRAVENOUS
  Filled 2019-05-14: qty 370

## 2019-05-14 NOTE — Research (Signed)
Title: FGCL-3019-091 (FibroGen Study) is a Phase 3, randomized, double-blind, placebo-controlled multicenter international study to evaluate evaluate the efficacy and safety of 30 mg/kg IV infusions of pamrevlumab administered every 3 weeks for 52 weeks as compared to placebo in subjects with Idiopathic Pulmonary Fibrosis. Primary end point is: change in FVC from baseline at week 52  Protocol #: FGCL-3019-091, Clinical Trials #: [id number] Sponsor: [email or web address]  ([place], [place], [place])  Scientist, physiological / Electrical engineer note : This visit for Subject Shane [name] with DOB: September 07, 1941 on 05/14/2019 for the above protocol is Visit/Encounter # Week 27  and is for purpose of research . Subject expressed continued interest and consent in continuing as a study subject. Subject confirmed that there was no change in contact information (e.g. address, telephone, email). Subject thanked for participation in research and contribution to science.   In this visit 05/14/2019 the subject will be evaluated by investigator named Dr. Chase Caller via video.   All procedures completed per the above mentioned protocol. Subject tolerated infusion well without complaints. Refer to the subjects paper source binder for further details of the visit.     Signed by Jon Billings, [place], [place] / Nurse [place], [place] 10:41 AM 05/14/2019

## 2019-05-16 ENCOUNTER — Encounter (HOSPITAL_COMMUNITY): Payer: Self-pay

## 2019-05-16 ENCOUNTER — Ambulatory Visit (HOSPITAL_COMMUNITY): Payer: Medicare Other

## 2019-05-21 ENCOUNTER — Encounter (HOSPITAL_COMMUNITY): Payer: Self-pay

## 2019-05-21 ENCOUNTER — Ambulatory Visit (HOSPITAL_COMMUNITY): Payer: Medicare Other

## 2019-05-23 ENCOUNTER — Ambulatory Visit (HOSPITAL_COMMUNITY): Payer: Medicare Other

## 2019-05-23 ENCOUNTER — Encounter (HOSPITAL_COMMUNITY): Payer: Self-pay

## 2019-05-28 ENCOUNTER — Encounter (HOSPITAL_COMMUNITY): Payer: Self-pay

## 2019-05-28 ENCOUNTER — Ambulatory Visit (HOSPITAL_COMMUNITY): Payer: Medicare Other

## 2019-05-28 ENCOUNTER — Encounter: Payer: Medicare Other | Admitting: *Deleted

## 2019-05-28 ENCOUNTER — Encounter (HOSPITAL_COMMUNITY)
Admission: RE | Admit: 2019-05-28 | Discharge: 2019-05-28 | Disposition: A | Discharge status: Home / Self Care | Payer: Medicare Other | Source: Ambulatory Visit | Attending: Internal Medicine | Admitting: Internal Medicine

## 2019-05-28 ENCOUNTER — Other Ambulatory Visit: Payer: Self-pay

## 2019-05-28 DIAGNOSIS — J84112 Idiopathic pulmonary fibrosis: Secondary | ICD-10-CM

## 2019-05-28 DIAGNOSIS — Z006 Encounter for examination for normal comparison and control in clinical research program: Secondary | ICD-10-CM

## 2019-05-28 MED ORDER — STUDY - FIBROGEN - PAMREVLUMAB OR PLACEBO 10 MG/ML IV INFUSION (PI-RAMASWAMY)
30.0000 mg/kg | Freq: Once | INTRAVENOUS | Status: AC
  Administered 2019-05-28: 3700 mg via INTRAVENOUS
  Filled 2019-05-28: qty 370

## 2019-05-28 NOTE — Research (Signed)
Title: FGCL-3019-091 (FibroGen Study) is a Phase 3, randomized, double-blind, placebo-controlled multicenter international study to evaluate evaluate the efficacy and safety of 30 mg/kg IV infusions of pamrevlumab administered every 3 weeks for 52 weeks as compared to placebo in subjects with Idiopathic Pulmonary Fibrosis. Primary end point is: change in FVC from baseline at week [place] / Research RN note : This visit for Subject Shane [name] with DOB: Apr 29, 1941 on 05/28/2019 for the above protocol is Visit/Encounter # Week 30  and is for purpose of research . Subject expressed continued interest and consent in continuing as a study subject. Subject confirmed that there was no change in contact information (e.g. address, telephone, email). Subject thanked for participation in research and contribution to science.   All procedures completed per the above mentined protocol.   Subject mentioned that since COVID restrictions he has not been able to attend rehab and feels that this is deconditioned him and it takes more effort to walk. Patient states that since he has to work harder to walk he is getting SOB faster than he did before. Refer to PI note to determine if AE.   Signed by Jon Billings, [place], [place] / Nurse [place], [place] 6:39 PM 05/28/2019

## 2019-05-30 ENCOUNTER — Encounter (HOSPITAL_COMMUNITY): Payer: Self-pay

## 2019-05-30 ENCOUNTER — Ambulatory Visit (HOSPITAL_COMMUNITY): Payer: Medicare Other

## 2019-06-04 ENCOUNTER — Ambulatory Visit (HOSPITAL_COMMUNITY): Payer: Medicare Other

## 2019-06-04 ENCOUNTER — Encounter (HOSPITAL_COMMUNITY): Payer: Self-pay

## 2019-06-06 ENCOUNTER — Ambulatory Visit (HOSPITAL_COMMUNITY): Payer: Medicare Other

## 2019-06-06 ENCOUNTER — Encounter (HOSPITAL_COMMUNITY): Payer: Self-pay

## 2019-06-11 ENCOUNTER — Ambulatory Visit (HOSPITAL_COMMUNITY): Payer: Medicare Other

## 2019-06-11 ENCOUNTER — Encounter (HOSPITAL_COMMUNITY): Payer: Self-pay

## 2019-06-12 ENCOUNTER — Telehealth (HOSPITAL_COMMUNITY): Payer: Self-pay | Admitting: *Deleted

## 2019-06-13 ENCOUNTER — Encounter (HOSPITAL_COMMUNITY)
Admission: RE | Admit: 2019-06-13 | Discharge: 2019-06-13 | Disposition: A | Discharge status: Home / Self Care | Payer: Medicare Other | Source: Ambulatory Visit | Attending: Internal Medicine | Admitting: Internal Medicine

## 2019-06-13 ENCOUNTER — Encounter (HOSPITAL_COMMUNITY): Payer: Self-pay

## 2019-06-13 ENCOUNTER — Ambulatory Visit (HOSPITAL_COMMUNITY): Payer: Medicare Other

## 2019-06-13 ENCOUNTER — Other Ambulatory Visit: Payer: Self-pay

## 2019-06-13 DIAGNOSIS — Z006 Encounter for examination for normal comparison and control in clinical research program: Secondary | ICD-10-CM | POA: Insufficient documentation

## 2019-06-13 MED ORDER — STUDY - FIBROGEN - PAMREVLUMAB OR PLACEBO 10 MG/ML IV INFUSION (PI-RAMASWAMY)
30.0000 mg/kg | Freq: Once | INTRAVENOUS | Status: AC
  Administered 2019-06-13: 3700 mg via INTRAVENOUS
  Filled 2019-06-13: qty 370

## 2019-06-13 NOTE — Research (Signed)
Title: FGCL-3019-091 (FibroGen Study) is a Phase 3, randomized, double-blind, placebo-controlled multicenter international study to evaluate evaluate the efficacy and safety of 30 mg/kg IV infusions of pamrevlumab administered every 3 weeks for 52 weeks as compared to placebo in subjects with Idiopathic Pulmonary Fibrosis. Primary end point is: change in FVC from baseline at week [place] / Research RN note : This visit for Subject Shane [name] with DOB: [birth year] on 06/13/2019 for the above protocol is Visit/Encounter # Week 33  and is for purpose of research .  Subject expressed continued interest and consent in continuing as a study subject. Subject confirmed that there was no change in contact information (e.g. address, telephone, email). Subject thanked for participation in research and contribution to science.  In this visit 06/13/2019 the subject will be evaluated by investigator named Dr. Chase Caller . This research coordinator has verified that the investigator is  uptodate with his/her training logs  All procedures completed per the above stated protocol.   While performing post-infusion vitals this CRC noticed the subjects heart rate was not reading properly on the monitor so a manual pulse check we performed and it was discovered that the subjects HR was irregular. CRC consulted with PI, Dr. Chase Caller regarding the irregular HR and a 12 lead EKG was ordered. Dr. Chase Caller then consulted with cardiologist Dr. 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 This is listed on past medical history for the subject and per PI is not considered an AE. The subject has been advised to follow-up with his cardiologist for further evaluation.   New AE: Pruritis on back and both arms, intermittent but states it has been occurring since the first occurrence of Rash back in Nov 2019, however today is the first day the subject has mentioned this issue.  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   Ongoing AE: Worsening SOB due to deconditioning: Subject states is ongoing but is not worse in severity since reported.   Refer to the subjects paper source binder for further details of the visit.   Signed by [place] Bing, BS, CMA, [facility]  [place], [place] 9:57 AM 06/13/2019

## 2019-06-18 ENCOUNTER — Encounter (HOSPITAL_COMMUNITY): Payer: Self-pay

## 2019-06-18 ENCOUNTER — Ambulatory Visit (HOSPITAL_COMMUNITY): Payer: Medicare Other

## 2019-06-20 ENCOUNTER — Ambulatory Visit (HOSPITAL_COMMUNITY): Payer: Medicare Other

## 2019-06-20 ENCOUNTER — Encounter (HOSPITAL_COMMUNITY): Payer: Self-pay

## 2019-06-25 ENCOUNTER — Encounter (HOSPITAL_COMMUNITY): Payer: Self-pay

## 2019-06-25 ENCOUNTER — Ambulatory Visit (HOSPITAL_COMMUNITY): Payer: Medicare Other

## 2019-07-10 ENCOUNTER — Ambulatory Visit (HOSPITAL_COMMUNITY)
Admission: RE | Admit: 2019-07-10 | Discharge: 2019-07-10 | Disposition: A | Discharge status: Home / Self Care | Payer: Medicare Other | Source: Ambulatory Visit | Attending: Internal Medicine | Admitting: Internal Medicine

## 2019-07-10 ENCOUNTER — Other Ambulatory Visit: Payer: Self-pay

## 2019-07-10 DIAGNOSIS — Z006 Encounter for examination for normal comparison and control in clinical research program: Secondary | ICD-10-CM | POA: Insufficient documentation

## 2019-07-10 MED ORDER — STUDY - FIBROGEN - PAMREVLUMAB OR PLACEBO 10 MG/ML IV INFUSION (PI-RAMASWAMY)
30.0000 mg/kg | Freq: Once | INTRAVENOUS | Status: AC
  Administered 2019-07-10: 3740 mg via INTRAVENOUS
  Filled 2019-07-10: qty 374

## 2019-07-10 NOTE — Research (Signed)
Title: FGCL-3019-091 (FibroGen Study) is a Phase 3, randomized, double-blind, placebo-controlled multicenter international study to evaluate evaluate the efficacy and safety of 30 mg/kg IV infusions of pamrevlumab administered every 3 weeks for 52 weeks as compared to placebo in subjects with Idiopathic Pulmonary Fibrosis. Primary end point is: change in FVC from baseline at week 52  Protocol #: FGCL-3019-091, Clinical Trials #: [id number] Sponsor: [email or web address]  ([place], [place], [place])  Scientist, physiological / Electrical engineer note : This visit for Subject Shane [name] with DOB: 04-Apr-1941 on 07/10/2019 for the above protocol is Visit/Encounter #  Week 14 and is for purpose of research . The consent for this encounter is under Protocol Version Amendment 4 and is currently IRB approved. Subject expressed continued interest and consent in continuing as a study subject. Subject confirmed that there was no change in contact information (e.g. address, telephone, email). Subject thanked for participation in research and contribution to science.   Re-consent needed: Subject re-consented on new ICF version 19Jun2020 Revised 19Jun2020. PI, Dr. Chase Caller was involved in consenting process. Please refer to the ICF documentation checklist in the subjects binder for further details of reconsent.   All procedures completed per the above stated protocol.   AE: Pruritis-subject states this is still ongoing, occurs intermittently Worsening Dyspnea due to deconditioning- ongoing per subject.   Refer to the subjects source binder for further details of this visit.    Signed by [place] Bing, BS, CMA, [facility]  [place], [place] 11:53 AM 07/10/2019

## 2019-07-31 ENCOUNTER — Ambulatory Visit (HOSPITAL_COMMUNITY)
Admission: RE | Admit: 2019-07-31 | Discharge: 2019-07-31 | Disposition: A | Discharge status: Home / Self Care | Payer: Medicare Other | Source: Ambulatory Visit | Attending: Internal Medicine | Admitting: Internal Medicine

## 2019-07-31 ENCOUNTER — Other Ambulatory Visit: Payer: Self-pay

## 2019-07-31 DIAGNOSIS — Z006 Encounter for examination for normal comparison and control in clinical research program: Secondary | ICD-10-CM | POA: Insufficient documentation

## 2019-07-31 MED ORDER — STUDY - FIBROGEN - PAMREVLUMAB OR PLACEBO 10 MG/ML IV INFUSION (PI-RAMASWAMY)
30.0000 mg/kg | Freq: Once | INTRAVENOUS | Status: AC
  Administered 2019-07-31: 3740 mg via INTRAVENOUS
  Filled 2019-07-31: qty 374

## 2019-08-05 NOTE — Research (Signed)
LATE ENTRY:  Title: FGCL-3019-091 (FibroGen Study) is a Phase 3, randomized, double-blind, placebo-controlled multicenter international study to evaluate evaluate the efficacy and safety of 30 mg/kg IV infusions of pamrevlumab administered every 3 weeks for 52 weeks as compared to placebo in subjects with Idiopathic Pulmonary Fibrosis. Primary end point is: change in FVC from baseline at week 52  Protocol #: FGCL-3019-091, Clinical Trials #: [id number] Sponsor: [email or web address]  ([place], [place], [place])  Scientist, physiological / Electrical engineer note : This visit for Subject Shane [name] with DOB: Jul 01, 1941 on 07/31/2019 for the above protocol is Visit/Encounter # Week 41  and is for purpose of research . Subject) expressed continued interest and consent in continuing as a study subject. Subject confirmed that there was no change in contact information (e.g. address, telephone, email). Subject thanked for participation in research and contribution to science.   Conmeds and AEs reviewed. The subject states he experienced a fall at home x 1 week ago. The cause of the fall was he tripped over some equipment in his basement while trying to move another object. Subject has an abrasion on his right forearm from the fall. The abrasion is healing, no redness or discharge in the area.  Subject states he applied neosporin to the area x 2 days following the fall.   PI videoed in for the visit and discussed the fall with the subject. Will document as an AE. The subject stated that he is still having worsening of dyspnea due to deconditioning, but feels this is same since the last visit, no worse. Pruritis is ongoing intermittently per subject.   Refer to the subjects paper source binder for further details of the visit.    Signed by, Marguerita Beards, [place], [facility]  [place], [place] 1:01 PM 08/05/2019

## 2019-08-07 ENCOUNTER — Telehealth (HOSPITAL_COMMUNITY): Payer: Self-pay | Admitting: Internal Medicine

## 2019-08-17 ENCOUNTER — Other Ambulatory Visit: Payer: Self-pay

## 2019-08-17 ENCOUNTER — Emergency Department (HOSPITAL_COMMUNITY)
Admission: EM | Admit: 2019-08-17 | Discharge: 2019-08-17 | Disposition: A | Discharge status: Home / Self Care | Payer: Medicare Other | Attending: Emergency Medicine | Admitting: Emergency Medicine

## 2019-08-17 ENCOUNTER — Emergency Department (HOSPITAL_COMMUNITY): Payer: Medicare Other

## 2019-08-17 ENCOUNTER — Encounter (HOSPITAL_COMMUNITY): Payer: Self-pay

## 2019-08-17 DIAGNOSIS — R51 Headache: Secondary | ICD-10-CM | POA: Diagnosis not present

## 2019-08-17 DIAGNOSIS — Y939 Activity, unspecified: Secondary | ICD-10-CM | POA: Diagnosis not present

## 2019-08-17 DIAGNOSIS — E1122 Type 2 diabetes mellitus with diabetic chronic kidney disease: Secondary | ICD-10-CM | POA: Diagnosis not present

## 2019-08-17 DIAGNOSIS — S0101XA Laceration without foreign body of scalp, initial encounter: Secondary | ICD-10-CM | POA: Diagnosis present

## 2019-08-17 DIAGNOSIS — N189 Chronic kidney disease, unspecified: Secondary | ICD-10-CM | POA: Insufficient documentation

## 2019-08-17 DIAGNOSIS — Y929 Unspecified place or not applicable: Secondary | ICD-10-CM | POA: Diagnosis not present

## 2019-08-17 DIAGNOSIS — S50811A Abrasion of right forearm, initial encounter: Secondary | ICD-10-CM | POA: Insufficient documentation

## 2019-08-17 DIAGNOSIS — R55 Syncope and collapse: Secondary | ICD-10-CM | POA: Insufficient documentation

## 2019-08-17 DIAGNOSIS — W1830XA Fall on same level, unspecified, initial encounter: Secondary | ICD-10-CM | POA: Diagnosis not present

## 2019-08-17 DIAGNOSIS — Z794 Long term (current) use of insulin: Secondary | ICD-10-CM | POA: Insufficient documentation

## 2019-08-17 DIAGNOSIS — Y999 Unspecified external cause status: Secondary | ICD-10-CM | POA: Insufficient documentation

## 2019-08-17 DIAGNOSIS — S0990XA Unspecified injury of head, initial encounter: Secondary | ICD-10-CM

## 2019-08-17 DIAGNOSIS — Z79899 Other long term (current) drug therapy: Secondary | ICD-10-CM | POA: Insufficient documentation

## 2019-08-17 DIAGNOSIS — R600 Localized edema: Secondary | ICD-10-CM | POA: Insufficient documentation

## 2019-08-17 DIAGNOSIS — I129 Hypertensive chronic kidney disease with stage 1 through stage 4 chronic kidney disease, or unspecified chronic kidney disease: Secondary | ICD-10-CM | POA: Insufficient documentation

## 2019-08-17 LAB — CBG MONITORING, ED
Glucose-Capillary: 99 mg/dL (ref 70–99)
Glucose-Capillary: 101 mg/dL — ABNORMAL HIGH (ref 70–99)

## 2019-08-17 MED ORDER — LIDOCAINE-EPINEPHRINE (PF) 2 %-1:200000 IJ SOLN
20.0000 mL | Freq: Once | INTRAMUSCULAR | Status: AC
  Administered 2019-08-17: 20 mL via INTRADERMAL
  Filled 2019-08-17: qty 20

## 2019-08-17 MED ORDER — LIDOCAINE-EPINEPHRINE-TETRACAINE (LET) SOLUTION
3.0000 mL | Freq: Once | NASAL | Status: AC
  Administered 2019-08-17: 3 mL via TOPICAL
  Filled 2019-08-17: qty 3

## 2019-08-17 MED ORDER — LIDOCAINE-EPINEPHRINE 2 %-1:100000 IJ SOLN: 20.0000 mL | Freq: Once | INTRAMUSCULAR | Status: DC

## 2019-08-17 NOTE — Discharge Instructions (Addendum)
WOUND CARE Please have your stitches/staples removed in 7-10 days or sooner if you have concerns. You may do this at any available urgent care or at your primary care doctor's office.  Keep area clean and dry for 24 hours. Do not remove bandage, if applied.  After 24 hours, remove bandage and wash wound gently with mild soap and warm water. Reapply a new bandage after cleaning wound, if directed.  Continue daily cleansing with soap and water until stitches/staples are removed.  Seek medical careif you experience any of the following signs of infection: Swelling, redness, pus drainage, streaking, fever >101.0 F  Seek care if you experience excessive bleeding that does not stop after 15-20 minutes of constant, firm pressure.

## 2019-08-17 NOTE — ED Triage Notes (Signed)
Pt arrives [facility] from home with c/o passing out, fall and laceration to back of head. Pt arrives with approx 4 cm lac to back of head with controlled bleeding. Pt has known irregular, low  heartrate. Hx of syncope.

## 2019-08-17 NOTE — ED Provider Notes (Signed)
..  Laceration Repair  Date/Time: 08/17/2019 4:11 PM Performed by: Margarita Mail, PA-C Authorized by: Margarita Mail, PA-C   Consent:    Consent obtained:  Verbal   Consent given by:  Patient   Risks discussed:  Infection, pain, retained foreign body, need for additional repair and poor cosmetic result   Alternatives discussed:  No treatment Anesthesia (see MAR for exact dosages):    Anesthesia method:  Topical application and local infiltration   Topical anesthetic:  LET   Local anesthetic:  Lidocaine 2% WITH epi Laceration details:    Location:  Scalp   Scalp location:  Occipital   Length (cm):  10   Depth (mm):  20 Repair type:    Repair type:  Simple Pre-procedure details:    Preparation:  Patient was prepped and draped in usual sterile fashion Exploration:    Wound exploration: wound explored through full range of motion and entire depth of wound probed and visualized     Contaminated: no   Treatment:    Area cleansed with:  Betadine   Amount of cleaning:  Standard   Irrigation solution:  Sterile water   Irrigation method:  Pressure wash Skin repair:    Repair method:  Staples   Number of staples:  4 Approximation:    Approximation:  Close Post-procedure details:    Dressing:  Non-adherent dressing   Patient tolerance of procedure:  Tolerated well, no immediate complications      Margarita Mail, PA-C 08/17/19 1613    Little, Wenda Overland, MD 08/20/19 [phone]

## 2019-08-17 NOTE — ED Notes (Signed)
Wound irrigated with 100 cc ns.

## 2019-08-17 NOTE — ED Notes (Signed)
DC instructions discussed with pt and wife and home stable with printed copu. Home with wife via wc.

## 2019-08-17 NOTE — ED Notes (Signed)
Harris PA at bedside

## 2019-08-17 NOTE — ED Notes (Signed)
Pt states he feels CBG is dropping. CBG obtained and will report to provider.

## 2019-08-17 NOTE — ED Provider Notes (Signed)
[facility] EMERGENCY DEPARTMENT Provider Note   CSN: [id number] Arrival date & time: 08/17/19  1330     History   Chief Complaint Chief Complaint  Patient presents with   Fall   Laceration   Loss of Consciousness    HPI HERSHELL BRANDL is a 78 y.o. male.     78 year old male with past medical history below including pulmonary fibrosis, type 2 diabetes mellitus, hypertension, neuromuscular disorder, CKD who presents with fall and head injury.  Just prior to arrival, the patient was stepping from his motorized wheelchair when he lost his balance and fell, striking the back of his head.  He had a brief loss of consciousness.  He sustained a laceration to the back of his head.  He reports chronic low back pain but denies any new areas of pain aside from headache.  He takes a baby aspirin daily but no other anticoagulant use.  He is up-to-date on vaccinations.  The history is provided by the patient and the EMS personnel.  Fall  Laceration Loss of Consciousness   Past Medical History:  Diagnosis Date   Cancer ([place])    ling   Cataract    Chest pain    Chronic kidney disease    d/t DM   Diabetes mellitus    Vgo disposal insulin bolus  simular to insulin pump   Dyspnea    GERD (gastroesophageal reflux disease)    Hyperlipidemia    Hypertension    Idiopathic pulmonary fibrosis ([place]) 11/2016   Neuromuscular disorder (HCC)    PONV (postoperative nausea and vomiting)    Sleep apnea     uses cpap asked to bring mask and tubing    Patient Active Problem List   Diagnosis Date Noted   Syncope 04/27/2018   Hypoglycemia due to insulin 01/06/2018   Essential hypertension 01/06/2018   Diabetes mellitus type 2, with complication, on long term insulin pump ([place]) 01/06/2018   Syncope and collapse 01/05/2018   Encounter for therapeutic drug monitoring 06/29/2017   Obstructive sleep apnea 05/05/2017   IPF (idiopathic pulmonary fibrosis)  ([place]) 01/05/2017   Abnormal chest x-ray 10/12/2016   Benign neoplasm of colon 01/14/2013    Past Surgical History:  Procedure Laterality Date   ANTERIOR FUSION CERVICAL SPINE  2012   CARDIAC CATHETERIZATION  2011   CARDIAC CATHETERIZATION N/A 11/09/2016   Procedure: Right Heart Cath;  Surgeon: Belva Crome, MD;  Location: [facility];  Service: Cardiovascular;  Laterality: N/A;   carpel tunnel     left wrist   CATARACT EXTRACTION W/ INTRAOCULAR LENS  IMPLANT, BILATERAL  2013   CERVICAL LAMINECTOMY  2012   COLONOSCOPY N/A 01/14/2013   Procedure: COLONOSCOPY;  Surgeon: Irene Shipper, MD;  Location: [facility];  Service: Endoscopy;  Laterality: N/A;   EYE SURGERY     KNEE SURGERY  1998   left   LUMBAR LAMINECTOMY  2003   LUNG BIOPSY Left 12/26/2016   Procedure: LUNG BIOPSY;  Surgeon: [name], MD;  Location: [place];  Service: Thoracic;  Laterality: Left;   POSTERIOR FUSION CERVICAL SPINE  2012   TRIGGER FINGER RELEASE  2011   4th finger left hand   VIDEO ASSISTED THORACOSCOPY Left 12/26/2016   Procedure: VIDEO ASSISTED THORACOSCOPY;  Surgeon: [name], MD;  Location: [facility];  Service: Thoracic;  Laterality: Left;   VIDEO BRONCHOSCOPY N/A 12/26/2016   Procedure: VIDEO BRONCHOSCOPY;  Surgeon: [name], MD;  Location: [facility];  Service: Thoracic;  Laterality: N/A;        Home Medications    Prior to Admission medications   Medication Sig Start Date End Date Taking? Authorizing Provider  Ascorbic Acid (VITAMIN C) 1000 MG tablet Take 1,000 mg by mouth daily.    [provider]  aspirin 81 MG tablet Take 81 mg by mouth daily.      [provider]  clotrimazole-betamethasone (LOTRISONE) cream APPLY TO AFFECTED AREA TWICE A DAY AS NEEDED FOR IRRITATION 01/16/17   [provider]  Continuous Blood Gluc Sensor (FREESTYLE LIBRE 14 DAY SENSOR) MISC 1 patch every 14 (fourteen) days.    [provider]    diazepam (VALIUM) 5 MG tablet Take 5 mg by mouth at bedtime as needed (for sleep).     [provider]  EPINEPHrine 0.3 mg/0.3 mL IJ SOAJ injection Inject 0.3 mg into the muscle once as needed (for anaphylactic reaction).  01/30/18   [provider]  fluticasone (FLONASE) 50 MCG/ACT nasal spray Place 2 sprays into both nostrils daily as needed for allergies or rhinitis.  01/19/18   [provider]  furosemide (LASIX) 40 MG tablet Take 40 mg by mouth daily. 02/15/18   [provider]  insulin detemir (LEVEMIR) 100 UNIT/ML injection Inject 22 Units into the skin at bedtime.    [provider]  insulin lispro (HUMALOG) 100 UNIT/ML injection Use with V-Go 40    [provider]  Insulin Pump Disposable (V-GO 40) KIT Dispose V-Go bag after 24 hours of use    [provider]  KLOR-CON M20 20 MEQ tablet Take 20 mEq by mouth daily.  09/26/16   [provider]  lisinopril (PRINIVIL,ZESTRIL) 20 MG tablet Take 20 mg by mouth daily.  09/08/16   [provider]  losartan (COZAAR) 50 MG tablet Take 50 mg by mouth daily.    [provider]  Magnesium Oxide 400 MG CAPS Take 1 capsule (400 mg total) by mouth daily. 02/05/19   Brand Males, MD  Multiple Vitamin (MULTIVITAMIN) tablet Take 1 tablet by mouth daily.      [provider]  pantoprazole (PROTONIX) 40 MG tablet Take 40 mg by mouth daily.      [provider]  PROAIR HFA 108 [phone] Base) MCG/ACT inhaler Inhale 2 puffs into the lungs every 4 (four) hours as needed for wheezing or shortness of breath.  10/03/16   [provider]  promethazine-phenylephrine (PROMETHAZINE VC PLAIN) 6.25-5 MG/5ML SYRP Take 5 mLs by mouth every 4 (four) hours as needed for congestion.    [provider]    Family History Family History  Problem Relation Age of Onset   Diabetes Mellitus II Mother    Emphysema Father 70   Colon cancer Neg Hx     Esophageal cancer Neg Hx    Rectal cancer Neg Hx    Stomach cancer Neg Hx     Social History Social History   Tobacco Use   Smoking status: Never Smoker   Smokeless tobacco: Never Used  Substance Use Topics   Alcohol use: No   Drug use: No     Allergies   Codeine, Other, and Ofev [nintedanib]   Review of Systems Review of Systems  Cardiovascular: Positive for syncope.   All other systems reviewed and are negative except that which was mentioned in HPI   Physical Exam Updated Vital Signs Pulse (!) 59    Temp 98.5 F (  36.9 C) (Oral)    Resp 17    Ht 6' (1.829 m)    Wt 121.6 kg    SpO2 99% Comment: ra   BMI 36.35 kg/m   Physical Exam Vitals signs and nursing note reviewed.  Constitutional:      General: He is not in acute distress.    Appearance: He is well-developed.  HENT:     Head: Normocephalic.     Comments: 4.5cm laceration posterior scalp    Nose: Nose normal.  Eyes:     Conjunctiva/sclera: Conjunctivae normal.     Pupils: Pupils are equal, round, and reactive to light.  Neck:     Comments: In c-collar Cardiovascular:     Rate and Rhythm: Regular rhythm. Bradycardia present.     Heart sounds: Normal heart sounds. No murmur.  Pulmonary:     Effort: Pulmonary effort is normal.     Breath sounds: Normal breath sounds.  Chest:     Chest wall: No tenderness.  Abdominal:     General: Bowel sounds are normal. There is no distension.     Palpations: Abdomen is soft.     Tenderness: There is no abdominal tenderness.  Musculoskeletal: Normal range of motion.        General: No tenderness.     Right lower leg: Edema present.     Left lower leg: Edema present.  Skin:    General: Skin is warm and dry.     Comments: Abrasions R forearm  Neurological:     Mental Status: He is alert and oriented to person, place, and time.     Comments: Fluent speech  Psychiatric:        Judgment: Judgment normal.      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 Initial encounter. EXAM: CT HEAD WITHOUT CONTRAST CT CERVICAL SPINE WITHOUT CONTRAST TECHNIQUE: Multidetector CT imaging of the head and cervical spine was performed following the standard protocol without intravenous contrast. Multiplanar CT image reconstructions of the cervical spine were also generated. COMPARISON:  01/05/2018 CT and prior studies FINDINGS: CT HEAD FINDINGS Brain: No evidence of acute infarction, hemorrhage, hydrocephalus, extra-axial collection or mass lesion/mass effect. Mild chronic small-vessel white matter ischemic changes again noted. Vascular: Carotid and vertebral atherosclerotic calcifications again noted. Skull: Normal. Negative for fracture or focal lesion. Sinuses/Orbits: No acute finding. Mild ethmoid mucosal thickening noted. Other: None CT CERVICAL SPINE FINDINGS Alignment: Normal. Skull base and vertebrae: No acute fracture. No primary bone lesion or focal pathologic process. Soft tissues and spinal canal: No prevertebral fluid or swelling. No visible canal hematoma. Disc levels:  Anterior and posterior fusion from C3-C7 again noted. Upper chest: No acute abnormality. Other: None IMPRESSION: 1. No evidence of acute intracranial abnormality. Mild chronic small-vessel white matter ischemic changes. 2. No static evidence of acute injury to the cervical spine. C3-C7 fusion again  noted. Electronically  Signed   By: Margarette Canada M.D.   On: 08/17/2019 14:32   Ct Cervical Spine Wo Contrast  Result Date: 08/17/2019 CLINICAL DATA:  78 year old male with acute head and neck injury following fall. Initial encounter. EXAM: CT HEAD WITHOUT CONTRAST CT CERVICAL SPINE WITHOUT CONTRAST TECHNIQUE: Multidetector CT imaging of the head and cervical spine was performed following the standard protocol without intravenous contrast. Multiplanar CT image reconstructions of the cervical spine were also generated. COMPARISON:  01/05/2018 CT and prior studies FINDINGS: CT HEAD FINDINGS Brain: No evidence of acute infarction, hemorrhage, hydrocephalus, extra-axial collection or mass lesion/mass effect. Mild chronic small-vessel white matter ischemic changes again noted. Vascular: Carotid and vertebral atherosclerotic calcifications again noted. Skull: Normal. Negative for fracture or focal lesion. Sinuses/Orbits: No acute finding. Mild ethmoid mucosal thickening noted. Other: None CT CERVICAL SPINE FINDINGS Alignment: Normal. Skull base and vertebrae: No acute fracture. No primary bone lesion or focal pathologic process. Soft tissues and spinal canal: No prevertebral fluid or swelling. No visible canal hematoma. Disc levels:  Anterior and posterior fusion from C3-C7 again noted. Upper chest: No acute abnormality. Other: None IMPRESSION: 1. No evidence of acute intracranial abnormality. Mild chronic small-vessel white matter ischemic changes. 2. No static evidence of acute injury to the cervical spine. C3-C7 fusion again noted. Electronically Signed   By: Margarette Canada M.D.   On: 08/17/2019 14:32    Procedures Procedures (including critical care time)  Medications Ordered in ED Medications  lidocaine-EPINEPHrine-tetracaine (LET) solution (has no administration in time range)  lidocaine-EPINEPHrine (XYLOCAINE W/EPI) 2 %-1:100000 (with pres) injection 20 mL (has no administration in time range)      Initial Impression / Assessment and Plan / ED Course  I have reviewed the triage vital signs and the nursing notes.  Pertinent labs & imaging results that were available during my care of the patient were reviewed by me and considered in my medical decision making (see chart for details).      Pt alert on exam, NAD, GCS 15. Tetanus UTD. Obtained CT head and c-spine which were negative acute. EKG reassuring. Cleaned wound. Pt signed out pending laceration repair. I anticipate discharge once complete. Final Clinical Impressions(s) / ED Diagnoses   Final diagnoses:  Injury of head, initial encounter  Laceration of scalp, initial encounter    ED Discharge Orders    None       Joannie Medine, Wenda Over[place], MD 08/18/19 1600

## 2019-08-20 ENCOUNTER — Ambulatory Visit (HOSPITAL_COMMUNITY)
Admission: RE | Admit: 2019-08-20 | Discharge: 2019-08-20 | Disposition: A | Discharge status: Home / Self Care | Payer: Medicare Other | Source: Ambulatory Visit | Attending: Internal Medicine | Admitting: Internal Medicine

## 2019-08-20 ENCOUNTER — Other Ambulatory Visit: Payer: Self-pay

## 2019-08-20 DIAGNOSIS — Z006 Encounter for examination for normal comparison and control in clinical research program: Secondary | ICD-10-CM | POA: Insufficient documentation

## 2019-08-20 MED ORDER — STUDY - FIBROGEN - PAMREVLUMAB OR PLACEBO 10 MG/ML IV INFUSION (PI-RAMASWAMY)
30.0000 mg/kg | Freq: Once | INTRAVENOUS | Status: AC
  Administered 2019-08-20: 3740 mg via INTRAVENOUS
  Filled 2019-08-20: qty 374

## 2019-08-20 NOTE — Research (Signed)
Title: FGCL-3019-091 ([employer] Study) is a Phase 3, randomized, double-blind, placebo-controlled multicenter international study to evaluate evaluate the efficacy and safety of 30 mg/kg IV infusions of pamrevlumab administered every 3 weeks for 52 weeks as compared to placebo in subjects with Idiopathic Pulmonary Fibrosis. Primary end point is: change in FVC from baseline at week 52  Protocol #: FGCL-3019-091, Clinical Trials #: [id number] Sponsor: [email or web address]  ([place], [place], [place])  Scientist, physiological / Electrical engineer note : This visit for Subject Shane [name] with DOB: 23-Nov-1941 on 08/20/2019 for the above protocol is Visit/Encounter # Week 42  and is for purpose of research . Subject expressed continued interest and consent in continuing as a study subject. Subject confirmed that there was no change in contact information (e.g. address, telephone, email). Subject thanked for participation in research and contribution to science.   Dr. Brand Males spoke with subject via video prior to infusion start.   New AE: On Sept 12, 2020 the subject was trying to get out of his motorized wheelchair into his truck and his foot slipped and he fell and hit his head. The subject lost consciousness and EMS was called.  Per PI-AE of mechanical fall and AE of head laceration, not related.   Refer to subjects paper source binder for further documentation of the visit.    Signed by Aledo Bing, BS, CMA, [place] Coordinator Lead [facility]  [place], [place] 9:58 AM 08/20/2019

## 2019-09-05 ENCOUNTER — Telehealth (HOSPITAL_COMMUNITY): Payer: Self-pay | Admitting: *Deleted

## 2019-09-05 ENCOUNTER — Telehealth: Payer: Self-pay | Admitting: Internal Medicine

## 2019-09-05 DIAGNOSIS — J84112 Idiopathic pulmonary fibrosis: Secondary | ICD-10-CM

## 2019-09-05 NOTE — Telephone Encounter (Signed)
Ok for rehab maintenance

## 2019-09-05 NOTE — Telephone Encounter (Signed)
Call returned to [place] at [facility], whoever was trying to transfer the call hung up. Patient has completed pulm rehab and needs an order for pulmonary maintenance.   Will have MR to address this order tomorrow once back in clinic.

## 2019-09-06 NOTE — Telephone Encounter (Signed)
Done, order placed

## 2019-09-10 ENCOUNTER — Other Ambulatory Visit: Payer: Self-pay

## 2019-09-10 ENCOUNTER — Encounter (HOSPITAL_COMMUNITY)
Admission: RE | Admit: 2019-09-10 | Discharge: 2019-09-10 | Disposition: A | Discharge status: Home / Self Care | Payer: Self-pay | Source: Ambulatory Visit | Attending: Internal Medicine | Admitting: Internal Medicine

## 2019-09-10 DIAGNOSIS — Z006 Encounter for examination for normal comparison and control in clinical research program: Secondary | ICD-10-CM | POA: Diagnosis not present

## 2019-09-10 NOTE — Progress Notes (Signed)
[name] presented today for their first day of exercise at pulmonary maintenance.   Entrance Vitals Blood Pressure: 124/60 Heart Rate: 68 Oxygen Saturation: 93 RA  Exercising Vitals Blood Pressure: 146/78 Heart Rate: 91  Oxygen Saturation: 95 RA  Exit Vitals Blood Pressure: 132/74 Heart Rate: 56 Oxygen Saturation: 96 RA   Fall Risk: reported a fall a few weeks ago  GOALS MET Proper associated with RPD/PD & O2 Sat [place] with exercise equipment Exercise tolerated well No report of cardiac concerns or symptoms  GOALS UNMET Not Applicable   Additional Comments: used the stepper

## 2019-09-11 ENCOUNTER — Ambulatory Visit (HOSPITAL_COMMUNITY)
Admission: RE | Admit: 2019-09-11 | Discharge: 2019-09-11 | Disposition: A | Discharge status: Home / Self Care | Payer: Medicare Other | Source: Ambulatory Visit | Attending: Internal Medicine | Admitting: Internal Medicine

## 2019-09-11 DIAGNOSIS — Z006 Encounter for examination for normal comparison and control in clinical research program: Secondary | ICD-10-CM | POA: Insufficient documentation

## 2019-09-11 MED ORDER — STUDY - FIBROGEN - PAMREVLUMAB OR PLACEBO 10 MG/ML IV INFUSION (PI-RAMASWAMY)
30.0000 mg/kg | Freq: Once | INTRAVENOUS | Status: AC
  Administered 2019-09-11: 3740 mg via INTRAVENOUS
  Filled 2019-09-11: qty 374

## 2019-09-12 ENCOUNTER — Other Ambulatory Visit: Payer: Self-pay

## 2019-09-12 ENCOUNTER — Encounter (HOSPITAL_COMMUNITY)
Admission: RE | Admit: 2019-09-12 | Discharge: 2019-09-12 | Disposition: A | Discharge status: Home / Self Care | Payer: Medicare Other | Source: Ambulatory Visit | Attending: Internal Medicine | Admitting: Internal Medicine

## 2019-09-12 NOTE — Research (Addendum)
Late Entry: Visit conducted on September 11, 2019  Title: FGCL-3019-091 (F[employer]Gen Study) is a Phase 3, randomized, double-blind, placebo-controlled multicenter international study to evaluate evaluate the efficacy and safety of 30 mg/kg IV infusions of pamrevlumab administered every 3 weeks for 52 weeks as compared to placebo in subjects with Idiopathic Pulmonary Fibrosis. Primary end point is: change in FVC from baseline at week 52  Protocol #: FGCL-3019-091, Clinical Trials #: [id number] Sponsor: [email or web address]  ([place], [place], [place])  Scientist, physiological / Electrical engineer note : This visit for Subject Shane [name] with DOB: 1941-05-03 on 09/12/2019 for the above protocol is Visit/Encounter # Week 58  and is for purpose of research.Subject expressed continued interest and consent in continuing as a study subject. Subject confirmed that there was no change in contact information (e.g. address, telephone, email). Subject thanked for participation in research and contribution to science.   In this visit 09/12/2019 the subject will be evaluated by investigator named Dr. Brand Males. A physical exam is not required for this visit so PI met with subject, discussed if there were any new AE's, existing AE's etc.  This research coordinator has verified that the investigator is uptodate with his/her training logs.  Adverse Events: Head Laceration-laceration scar visualized, staples removed on Sept 28, 2020. Scar healing nicely. Will add end date of Sept 28, 2020.  Right forearm bruising-resolved as of Sept 28, 2020 per subject.  No new events identified.   All procedures/assessments completed per the above mentioned protocol. Refer to the subjects paper source binder for further details of the visit.   Signed by Jon Billings, [facility]  [place], [place] 10:55 AM 09/12/2019

## 2019-09-17 ENCOUNTER — Other Ambulatory Visit: Payer: Self-pay

## 2019-09-17 ENCOUNTER — Encounter (HOSPITAL_COMMUNITY)
Admission: RE | Admit: 2019-09-17 | Discharge: 2019-09-17 | Disposition: A | Discharge status: Home / Self Care | Payer: Self-pay | Source: Ambulatory Visit | Attending: Internal Medicine | Admitting: Internal Medicine

## 2019-09-19 ENCOUNTER — Encounter (HOSPITAL_COMMUNITY)
Admission: RE | Admit: 2019-09-19 | Discharge: 2019-09-19 | Disposition: A | Discharge status: Home / Self Care | Payer: Medicare Other | Source: Ambulatory Visit | Attending: Internal Medicine | Admitting: Internal Medicine

## 2019-09-19 ENCOUNTER — Other Ambulatory Visit: Payer: Self-pay

## 2019-09-20 ENCOUNTER — Other Ambulatory Visit: Payer: Self-pay | Admitting: *Deleted

## 2019-09-20 DIAGNOSIS — J84112 Idiopathic pulmonary fibrosis: Secondary | ICD-10-CM

## 2019-09-20 DIAGNOSIS — Z006 Encounter for examination for normal comparison and control in clinical research program: Secondary | ICD-10-CM

## 2019-09-24 ENCOUNTER — Other Ambulatory Visit: Payer: Self-pay

## 2019-09-24 ENCOUNTER — Encounter (HOSPITAL_COMMUNITY)
Admission: RE | Admit: 2019-09-24 | Discharge: 2019-09-24 | Disposition: A | Discharge status: Home / Self Care | Payer: Medicare Other | Source: Ambulatory Visit | Attending: Internal Medicine | Admitting: Internal Medicine

## 2019-09-25 ENCOUNTER — Telehealth: Payer: Self-pay | Admitting: *Deleted

## 2019-09-26 ENCOUNTER — Other Ambulatory Visit: Payer: Self-pay

## 2019-09-26 ENCOUNTER — Encounter (HOSPITAL_COMMUNITY)
Admission: RE | Admit: 2019-09-26 | Discharge: 2019-09-26 | Disposition: A | Discharge status: Home / Self Care | Payer: Medicare Other | Source: Ambulatory Visit | Attending: Internal Medicine | Admitting: Internal Medicine

## 2019-09-30 ENCOUNTER — Ambulatory Visit (INDEPENDENT_AMBULATORY_CARE_PROVIDER_SITE_OTHER)
Admission: RE | Admit: 2019-09-30 | Discharge: 2019-09-30 | Disposition: A | Discharge status: Home / Self Care | Payer: Self-pay | Source: Ambulatory Visit | Attending: Internal Medicine | Admitting: Internal Medicine

## 2019-09-30 ENCOUNTER — Other Ambulatory Visit: Payer: Self-pay

## 2019-09-30 DIAGNOSIS — Z006 Encounter for examination for normal comparison and control in clinical research program: Secondary | ICD-10-CM

## 2019-09-30 DIAGNOSIS — J84112 Idiopathic pulmonary fibrosis: Secondary | ICD-10-CM

## 2019-10-01 ENCOUNTER — Encounter (HOSPITAL_COMMUNITY)
Admission: RE | Admit: 2019-10-01 | Discharge: 2019-10-01 | Disposition: A | Discharge status: Home / Self Care | Payer: Medicare Other | Source: Ambulatory Visit | Attending: Internal Medicine | Admitting: Internal Medicine

## 2019-10-01 NOTE — Progress Notes (Signed)
Pt notified IPF stable Pt has appt in clinic tomorrow. Nothing further needed.

## 2019-10-02 ENCOUNTER — Other Ambulatory Visit: Payer: Self-pay

## 2019-10-02 ENCOUNTER — Encounter: Payer: Self-pay | Admitting: Internal Medicine

## 2019-10-02 ENCOUNTER — Encounter (HOSPITAL_COMMUNITY): Payer: Self-pay | Admitting: Internal Medicine

## 2019-10-02 ENCOUNTER — Encounter: Payer: Medicare Other | Admitting: Internal Medicine

## 2019-10-02 ENCOUNTER — Ambulatory Visit: Payer: Medicare Other | Admitting: Internal Medicine

## 2019-10-02 VITALS — BP 122/80 | HR 60 | Temp 97.8°F | Ht 72.0 in | Wt 271.4 lb

## 2019-10-02 DIAGNOSIS — J84112 Idiopathic pulmonary fibrosis: Secondary | ICD-10-CM | POA: Diagnosis not present

## 2019-10-02 DIAGNOSIS — I350 Nonrheumatic aortic (valve) stenosis: Secondary | ICD-10-CM

## 2019-10-02 DIAGNOSIS — I5189 Other ill-defined heart diseases: Secondary | ICD-10-CM | POA: Diagnosis not present

## 2019-10-02 DIAGNOSIS — R0602 Shortness of breath: Secondary | ICD-10-CM | POA: Diagnosis not present

## 2019-10-02 NOTE — Progress Notes (Addendum)
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  Problem Relation Age of Onset   Diabetes Mellitus II Mother    Emphysema Father 40   Colon cancer Neg Hx    Esophageal cancer Neg Hx    Rectal cancer Neg Hx    Stomach cancer Neg Hx      Current Outpatient Medications:    Ascorbic Acid (VITAMIN C) 1000 MG tablet, Take 1,000 mg by mouth daily., Disp: , Rfl:    aspirin 81 MG tablet, Take 81 mg by mouth daily.  , Disp: , Rfl:    clotrimazole-betamethasone (LOTRISONE) cream, Apply 1 application topically daily as needed (for skin infection). , Disp: , Rfl: 0   Continuous Blood Gluc Sensor (FREESTYLE LIBRE 14 DAY SENSOR) MISC, 1 patch every 14 (fourteen) days., Disp: , Rfl:    diazepam (VALIUM) 5 MG tablet, Take 5 mg by mouth at bedtime as needed (for sleep). , Disp: , Rfl:    EPINEPHrine 0.3 mg/0.3 mL IJ SOAJ injection, Inject 0.3 mg into the muscle once as needed (for anaphylactic reaction). , Disp: , Rfl: 0   furosemide (LASIX) 40 MG tablet, Take 40 mg by mouth  daily., Disp: , Rfl:    insulin detemir (LEVEMIR) 100 UNIT/ML injection, Inject 22 Units into the skin at bedtime., Disp: , Rfl:    insulin lispro (HUMALOG) 100 UNIT/ML injection, Inject 40 Units into the skin daily. Use with V-Go 40 , Disp: , Rfl:    KLOR-CON M20 20 MEQ tablet, Take 20 mEq by mouth daily. , Disp: , Rfl:    lisinopril (PRINIVIL,ZESTRIL) 20 MG tablet, Take 20 mg by mouth daily. , Disp: , Rfl:    losartan (COZAAR) 50 MG tablet, Take 50 mg by mouth daily., Disp: , Rfl:    Multiple Vitamin (MULTIVITAMIN) tablet, Take 1 tablet by mouth daily.  , Disp: , Rfl:    pantoprazole (PROTONIX) 40 MG tablet, Take 40 mg by mouth daily.  , Disp: , Rfl:    PROAIR HFA 108 (90 Base) MCG/ACT inhaler, Inhale 2 puffs into  the lungs every 4 (four) hours as needed for wheezing or shortness of breath. , Disp: , Rfl:       Objective:   Vitals:   10/02/19 0906  BP: 122/80  Pulse: 60  Temp: 97.8 F (36.6 C)  TempSrc: Temporal  SpO2: 99%  Weight: 271 lb 6.4 oz (123.1 kg)  Height: 6' (1.829 m)    Estimated body mass index is 36.81 kg/m as calculated from the following:   Height as of this encounter: 6' (1.829 m).   Weight as of this encounter: 271 lb 6.4 oz (123.1 kg).  @WEIGHTCHANGE @  [employer]   10/02/19 0906  Weight: 271 lb 6.4 oz (123.1 kg)     Physical Exam  General Appearance:    Alert, cooperative, no distress, appears stated age - ye , Deconditioned looking - no , OBESE  - yse, Sitting on Wheelchair -  no  Head:    Normocephalic, without obvious abnormality, atraumatic  Eyes:    PERRL, conjunctiva/corneas clear,  Ears:    Normal TM's and external ear canals, both ears  Nose:   Nares normal, septum midline, mucosa normal, no drainage    or sinus tenderness. OXYGEN ON  - no . Patient is @ ra   Throat:   Lips, mucosa, and tongue normal; teeth and gums normal. Cyanosis on lips - no  Neck:   Supple, symmetrical, trachea midline, no adenopathy;    thyroid:  no  enlargement/tenderness/nodules; no carotid   bruit or JVD  Back:     Symmetric, no curvature, ROM normal, no CVA tenderness  Lungs:     Distress - no , Wheeze no, Barrell Chest - no, Purse lip breathing - no, Crackles - yes mild scattered   Chest Wall:    No tenderness or deformity.    Heart:    Regular rate and rhythm, S1 and S2 normal, no rub   or gallop, Murmur - no  Breast Exam:    NOT DONE  Abdomen:     Soft, non-tender, bowel sounds active all four quadrants,    no masses, no organomegaly. Visceral obesity - yes  Genitalia:   NOT DONE  Rectal:   NOT DONE  Extremities:   Extremities - normal, Has Cane - yes, Clubbing - no, Edema - no  Pulses:   2+ and symmetric all extremities  Skin:   Stigmata of Connective Tissue Disease - no  Lymph nodes:   Cervical, supraclavicular, and axillary nodes normal  Psychiatric:  Neurologic:   Pleasant - yes, Anxious - no, Flat affect - no  CAm-ICU - neg, Alert and Oriented x 3 - yes, Moves all 4s - yes, Speech - normal, Cognition - intact           Assessment:       ICD-10-CM   1. Aortic valve stenosis, etiology of cardiac valve disease unspecified  I35.0 ECHOCARDIOGRAM COMPLETE  2. Diastolic dysfunction  K35.46   3. 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HCC)  J84.112 Pulse oximetry, overnight  4. Shortness of breath  R06.02        Plan:     Patient Instructions     ICD-10-CM   1. Aortic valve stenosis, etiology of cardiac valve disease unspecified  I35.0 ECHOCARDIOGRAM COMPLETE  2. 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[place])  J84.112   3. Shortness of breath  R06.02    Aortic valve stenosis, etiology of cardiac valve disease unspecified  Diastolic dysfunciton  - need to see if this is worse  and contributing to shortness of breath worsening  Plan  - get echo with [facility] - will call with results  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[place])  - worse til start of study but seems stable once you got into study (see numbers  below) - [facility] study concluded  Plan  - support rolling over into open label extension of the ZEphyrus study  - test ONO on room air - cotinue rehab - Meet Verline Lema for IPF registry visit today  Shortness of breath - due to all of above;  - address this through each mechanism above   Followup - 6 months or sooner if needed for standard of care    Results for Shane [name], Shane [name] (MRN [id number]) as of 05/23/2018 12:02  Ref. Range 10/14/2016 08:19 04/06/2017 10:13 10/04/2017 08:51 12/25/2017 08:45 05/23/2018 10:37 04/06/2019 research 07/10/2019 researhc  FVC-Pre Latest Units: L 3.00 2.94 2.94 2.88 2.89 2.71 2.72  FVC-%Pred-Pre Latest Units: % 63 62 62 61 61 64% 64%     > 50% of this > 40 min visit spent in face to face counseling or/and coordination of care - by this undersigned MD - Dr Brand Males. This includes one or more of the following documented above: discussion of test results, diagnostic or treatment recommendations, prognosis, risks and benefits of management options, instructions, education, compliance or risk-factor reduction    SIGNATURE    Dr. Brand Males, M.D., F.C.C.P,  Pulmonary and Critical Care Medicine Staff Physician, [place] Director - Interstitial Lung Disease  Program  Pulmonary [place] [facility] [place], [place], [address]  Pager: [phone], If no answer or between  15:00h - 7:00h: call [phone] Telephone: [phone]  9:49 AM 10/02/2019

## 2019-10-02 NOTE — Research (Signed)
IPF PRO Registry  Purpose: To collect data and biological samples that will support future research studies.  Registry will describe current approaches to diagnosis and treatment of IPF, analyze participant characteristics to describe the natural history of the disease, assess quality of life, describe participants interactions with the health care system, describe IPF treatment practices across multiple institutions, and utilize biological samples linked to well characterized IPF participants to identify disease biomarkers.   Clinical Research Coordinator / Research RN note : This visit for Subject Shane [name] with DOB: Apr 14, 1941 on 10/02/2019 for the above protocol is Visit/Encounter # 30 Month (Visit 5) and is for the purpose of research. This subject reported to the site for reconsent to Addendum Consent to Participate in a Research Registry and to redraw DNA sample. Subject completed reconsent and the DNA sample was collected per the protocol. In addition, the regularly scheduled blood draw and questionnaires were also completed. Refer to the subject's paper source binder for further details.  Subject confirmed that there was no change in contact information (e.g. address, telephone, email). Subject thanked for participation in research and contribution to science.    Signed by  [place] Assistant [facility]  [place], [place] 9:38 AM 10/02/2019

## 2019-10-02 NOTE — Patient Instructions (Addendum)
ICD-10-CM   1. Aortic valve stenosis, etiology of cardiac valve disease unspecified  I35.0 ECHOCARDIOGRAM COMPLETE  2. 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[place])  J84.112   3. Shortness of breath  R06.02    Aortic valve stenosis, etiology of cardiac valve disease unspecified  Diastolic dysfunciton  - need to see if this is worse and contributing to shortness of breath worsening  Plan  - get echo with [facility] - will call with results  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[place])  - worse til start of study but seems stable once you got into study (see numbers below) - [facility] study concluded  Plan  - support rolling over into open label extension of the [facility]Eph[facility] study  - test ONO on room air - cotinue rehab - Meet Verline Lema for IPF registry visit today  Shortness of breath - due to all of above;  - address this through each mechanism above   Followup - 6 months or sooner if needed for standard of care    Results for [place], ALLDREDGE (MRN [id number]) as of 05/23/2018 12:02  Ref. Range 10/14/2016 08:19 04/06/2017 10:13 10/04/2017 08:51 12/25/2017 08:45 05/23/2018 10:37 04/06/2019 research 07/10/2019 researhc  FVC-Pre Latest Units: L 3.00 2.94 2.94 2.88 2.89 2.71 2.72  FVC-%Pred-Pre Latest Units: % 63 62 62 61 61 64% 64%

## 2019-10-03 ENCOUNTER — Encounter (HOSPITAL_COMMUNITY)
Admission: RE | Admit: 2019-10-03 | Discharge: 2019-10-03 | Disposition: A | Discharge status: Home / Self Care | Payer: Medicare Other | Source: Ambulatory Visit | Attending: Internal Medicine | Admitting: Internal Medicine

## 2019-10-04 ENCOUNTER — Ambulatory Visit (HOSPITAL_COMMUNITY)
Admission: RE | Admit: 2019-10-04 | Discharge: 2019-10-04 | Disposition: A | Discharge status: Home / Self Care | Payer: Medicare Other | Source: Ambulatory Visit | Attending: Internal Medicine | Admitting: Internal Medicine

## 2019-10-04 ENCOUNTER — Other Ambulatory Visit: Payer: Self-pay

## 2019-10-04 DIAGNOSIS — Z006 Encounter for examination for normal comparison and control in clinical research program: Secondary | ICD-10-CM | POA: Insufficient documentation

## 2019-10-04 LAB — GLUCOSE, CAPILLARY: Glucose-Capillary: 167 mg/dL — ABNORMAL HIGH (ref 70–99)

## 2019-10-04 MED ORDER — STUDY - FIBROGEN - PAMREVLUMAB OR PLACEBO 10 MG/ML IV INFUSION (PI-RAMASWAMY)
30.0000 mg/kg | Freq: Once | INTRAVENOUS | Status: AC
  Administered 2019-10-04: 3700 mg via INTRAVENOUS
  Filled 2019-10-04: qty 370

## 2019-10-04 NOTE — Research (Signed)
Title: FGCL-3019-091 (FibroGen Study) is a Phase 3, randomized, double-blind, placebo-controlled multicenter international study to evaluate evaluate the efficacy and safety of 30 mg/kg IV infusions of pamrevlumab administered every 3 weeks for 52 weeks as compared to placebo in subjects with Idiopathic Pulmonary Fibrosis. Primary end point is: change in FVC from baseline at week [place] / Research RN note : This visit for Subject Shane [name] with DOB: 05-03-1941 on 10/04/2019 for the above protocol is Visit/Encounter # Week 53  and is for purpose of research .Subject/LAR expressed continued interest and consent in continuing as a study subject. Subject signed the "Informed Consent form for subject participating in the open label extension phase" at this visit to continue into the open label extension. PI Dr. Brand Males was present via video conference for the re-consent process.  Subject confirmed that there was no change in contact information (e.g. address, telephone, email). Subject thanked for participation in research and contribution to science.  PI Dr. Chase Caller also virtually evaluated the subject prior to the start of the infusion. All procedures were completed according to the above mentioned protocol. Please refer to the subjects paper source binder for further documentation of the visit.    Signed by  [place] Bing, BS, CMA, [place] Coordinator Lead [facility]  [place], [place] 3:44 PM 10/04/2019

## 2019-10-08 ENCOUNTER — Encounter (HOSPITAL_COMMUNITY): Admission: RE | Admit: 2019-10-08 | Payer: Medicare Other | Source: Ambulatory Visit

## 2019-10-08 DIAGNOSIS — Z006 Encounter for examination for normal comparison and control in clinical research program: Secondary | ICD-10-CM | POA: Insufficient documentation

## 2019-10-10 ENCOUNTER — Other Ambulatory Visit: Payer: Self-pay

## 2019-10-10 ENCOUNTER — Encounter (HOSPITAL_COMMUNITY)
Admission: RE | Admit: 2019-10-10 | Discharge: 2019-10-10 | Disposition: A | Discharge status: Home / Self Care | Payer: Self-pay | Source: Ambulatory Visit | Attending: Internal Medicine | Admitting: Internal Medicine

## 2019-10-15 ENCOUNTER — Other Ambulatory Visit: Payer: Self-pay

## 2019-10-15 ENCOUNTER — Encounter (HOSPITAL_COMMUNITY)
Admission: RE | Admit: 2019-10-15 | Discharge: 2019-10-15 | Disposition: A | Discharge status: Home / Self Care | Payer: Self-pay | Source: Ambulatory Visit | Attending: Internal Medicine | Admitting: Internal Medicine

## 2019-10-16 ENCOUNTER — Ambulatory Visit (HOSPITAL_COMMUNITY): Payer: Medicare Other | Attending: Internal Medicine

## 2019-10-16 DIAGNOSIS — I350 Nonrheumatic aortic (valve) stenosis: Secondary | ICD-10-CM | POA: Insufficient documentation

## 2019-10-16 MED ORDER — PERFLUTREN LIPID MICROSPHERE
1.0000 mL | INTRAVENOUS | Status: AC | PRN
  Administered 2019-10-16: 2 mL via INTRAVENOUS

## 2019-10-17 ENCOUNTER — Encounter (HOSPITAL_COMMUNITY): Payer: Medicare Other

## 2019-10-22 ENCOUNTER — Encounter (HOSPITAL_COMMUNITY)
Admission: RE | Admit: 2019-10-22 | Discharge: 2019-10-22 | Disposition: A | Discharge status: Home / Self Care | Payer: Self-pay | Source: Ambulatory Visit | Attending: Internal Medicine | Admitting: Internal Medicine

## 2019-10-22 ENCOUNTER — Other Ambulatory Visit: Payer: Self-pay

## 2019-10-24 ENCOUNTER — Other Ambulatory Visit: Payer: Self-pay

## 2019-10-24 ENCOUNTER — Encounter (HOSPITAL_COMMUNITY)
Admission: RE | Admit: 2019-10-24 | Discharge: 2019-10-24 | Disposition: A | Discharge status: Home / Self Care | Payer: Self-pay | Source: Ambulatory Visit | Attending: Internal Medicine | Admitting: Internal Medicine

## 2019-10-29 ENCOUNTER — Encounter (HOSPITAL_COMMUNITY): Payer: Medicare Other

## 2019-11-05 ENCOUNTER — Encounter (HOSPITAL_COMMUNITY)
Admission: RE | Admit: 2019-11-05 | Discharge: 2019-11-05 | Disposition: A | Discharge status: Home / Self Care | Payer: Medicare Other | Source: Ambulatory Visit | Attending: Internal Medicine | Admitting: Internal Medicine

## 2019-11-05 ENCOUNTER — Other Ambulatory Visit: Payer: Self-pay

## 2019-11-05 DIAGNOSIS — Z006 Encounter for examination for normal comparison and control in clinical research program: Secondary | ICD-10-CM | POA: Insufficient documentation

## 2019-11-06 ENCOUNTER — Ambulatory Visit (HOSPITAL_COMMUNITY)
Admission: RE | Admit: 2019-11-06 | Discharge: 2019-11-06 | Disposition: A | Discharge status: Home / Self Care | Payer: Medicare Other | Source: Ambulatory Visit | Attending: Internal Medicine | Admitting: Internal Medicine

## 2019-11-06 MED ORDER — STUDY - FIBROGEN - PAMREVLUMAB 10 MG/ML IV INFUSION (OPEN LABEL) (PI-RAMASWAMY)
30.0000 mg/kg | Freq: Once | INTRAVENOUS | Status: DC
  Filled 2019-11-06: qty 371

## 2019-11-06 NOTE — Research (Signed)
Open Label Extension of the  FGCL-3019-091 (FibroGen Study) is a Phase 3, randomized, double-blind, placebo-controlled multicenter international study to evaluate evaluate the efficacy and safety of 30 mg/kg IV infusions of pamrevlumab administered every 3 weeks for 52 weeks as compared to placebo in subjects with Idiopathic Pulmonary Fibrosis. Primary end point is: change in FVC from baseline at week [place] / Research RN note : This visit for Subject Shane [name] with DOB: [birth year] on 11/06/2019 for the above protocol is Visit/Encounter #OLE Ex Day 1 and is for purpose of research. The consent for this encounter is under Protocol Version 4 and is currently IRB approved. Subject expressed continued interest and consent in continuing as a study subject. Subject confirmed that there was no change in contact information (e.g. address, telephone, email). Subject thanked for participation in research and contribution to science.   In this visit 11/06/2019 the subject will be evaluated by investigator named Dr. Chase Caller . This research coordinator has verified that the investigator is  uptodate with his/her training logs  Because this visit is a key visit of day 1 of open label this visit is under direct supervision of the PI Dr. Chase Caller .   After completion of the visit it was discovered that the visit was completed 5 days out of window. This was discussed with the PI Dr. Chase Caller and per PI no harm to the subject occurred due to this deviation. Site will document deviation appropriately. Other procedures and assessments were completed per the above mentioned protocol.    Signed by  [place] Bing, BS, CMA, [place] [facility]  [place], [place] 5:49 PM 11/06/2019

## 2019-11-07 ENCOUNTER — Encounter (HOSPITAL_COMMUNITY): Payer: Medicare Other

## 2019-11-12 ENCOUNTER — Other Ambulatory Visit: Payer: Self-pay

## 2019-11-12 ENCOUNTER — Encounter (HOSPITAL_COMMUNITY)
Admission: RE | Admit: 2019-11-12 | Discharge: 2019-11-12 | Disposition: A | Discharge status: Home / Self Care | Payer: Medicare Other | Source: Ambulatory Visit | Attending: Internal Medicine | Admitting: Internal Medicine

## 2019-11-14 ENCOUNTER — Encounter (HOSPITAL_COMMUNITY)
Admission: RE | Admit: 2019-11-14 | Discharge: 2019-11-14 | Disposition: A | Discharge status: Home / Self Care | Payer: Medicare Other | Source: Ambulatory Visit | Attending: Internal Medicine | Admitting: Internal Medicine

## 2019-11-14 ENCOUNTER — Other Ambulatory Visit: Payer: Self-pay

## 2019-11-19 ENCOUNTER — Other Ambulatory Visit: Payer: Self-pay

## 2019-11-19 ENCOUNTER — Encounter (HOSPITAL_COMMUNITY)
Admission: RE | Admit: 2019-11-19 | Discharge: 2019-11-19 | Disposition: A | Discharge status: Home / Self Care | Payer: Medicare Other | Source: Ambulatory Visit | Attending: Internal Medicine | Admitting: Internal Medicine

## 2019-11-21 ENCOUNTER — Encounter (HOSPITAL_COMMUNITY): Payer: Medicare Other

## 2019-11-26 ENCOUNTER — Encounter (HOSPITAL_COMMUNITY)
Admission: RE | Admit: 2019-11-26 | Discharge: 2019-11-26 | Disposition: A | Discharge status: Home / Self Care | Payer: Medicare Other | Source: Ambulatory Visit | Attending: Internal Medicine | Admitting: Internal Medicine

## 2019-11-26 ENCOUNTER — Other Ambulatory Visit: Payer: Self-pay

## 2019-11-27 ENCOUNTER — Ambulatory Visit (HOSPITAL_COMMUNITY)
Admission: RE | Admit: 2019-11-27 | Discharge: 2019-11-27 | Disposition: A | Discharge status: Home / Self Care | Payer: Medicare Other | Source: Ambulatory Visit | Attending: Internal Medicine | Admitting: Internal Medicine

## 2019-11-27 DIAGNOSIS — Z006 Encounter for examination for normal comparison and control in clinical research program: Secondary | ICD-10-CM | POA: Insufficient documentation

## 2019-11-27 DIAGNOSIS — J84112 Idiopathic pulmonary fibrosis: Secondary | ICD-10-CM | POA: Insufficient documentation

## 2019-11-27 MED ORDER — STUDY - FIBROGEN - PAMREVLUMAB 10 MG/ML IV INFUSION (OPEN LABEL) (PI-RAMASWAMY)
30.0000 mg/kg | Freq: Once | INTRAVENOUS | Status: AC
  Administered 2019-11-27: 3710 mg via INTRAVENOUS
  Filled 2019-11-27: qty 371

## 2019-11-28 ENCOUNTER — Encounter (HOSPITAL_COMMUNITY): Payer: Medicare Other

## 2019-12-05 ENCOUNTER — Other Ambulatory Visit: Payer: Self-pay

## 2019-12-05 ENCOUNTER — Encounter (HOSPITAL_COMMUNITY)
Admission: RE | Admit: 2019-12-05 | Discharge: 2019-12-05 | Disposition: A | Discharge status: Home / Self Care | Payer: Medicare Other | Source: Ambulatory Visit | Attending: Internal Medicine | Admitting: Internal Medicine

## 2019-12-05 NOTE — Progress Notes (Signed)
Today was Shane [name]'s last day to exercise at the Pulmonary Rehab Maintenance program. We are not offering inpatient exercise sessions at this time. Shane [name] is interested in retuning when we are.

## 2019-12-13 ENCOUNTER — Ambulatory Visit: Payer: Medicare Other | Admitting: Internal Medicine

## 2019-12-20 ENCOUNTER — Other Ambulatory Visit: Payer: Self-pay

## 2019-12-20 ENCOUNTER — Ambulatory Visit (HOSPITAL_COMMUNITY)
Admission: RE | Admit: 2019-12-20 | Discharge: 2019-12-20 | Disposition: A | Discharge status: Home / Self Care | Payer: Medicare Other | Source: Ambulatory Visit | Attending: Internal Medicine | Admitting: Internal Medicine

## 2019-12-20 DIAGNOSIS — J84112 Idiopathic pulmonary fibrosis: Secondary | ICD-10-CM | POA: Insufficient documentation

## 2019-12-20 DIAGNOSIS — Z006 Encounter for examination for normal comparison and control in clinical research program: Secondary | ICD-10-CM | POA: Insufficient documentation

## 2019-12-20 MED ORDER — STUDY - FIBROGEN - PAMREVLUMAB 10 MG/ML IV INFUSION (OPEN LABEL) (PI-RAMASWAMY)
30.0000 mg/kg | Freq: Once | INTRAVENOUS | Status: AC
  Administered 2019-12-20: 3710 mg via INTRAVENOUS
  Filled 2019-12-20: qty 371

## 2019-12-28 ENCOUNTER — Ambulatory Visit: Payer: Medicare Other | Attending: Internal Medicine

## 2019-12-28 DIAGNOSIS — Z23 Encounter for immunization: Secondary | ICD-10-CM | POA: Insufficient documentation

## 2019-12-28 NOTE — Progress Notes (Signed)
   Covid-19 Vaccination Clinic  Name:  Shane [name]    MRN: [id number] DOB: [birth year]  12/28/2019  [name] was observed post Covid-19 immunization for 15 minutes without incidence. He was provided with Vaccine Information Sheet and instruction to access the V-Safe system.   [name] was instructed to call 911 with any severe reactions post vaccine: Marland Kitchen Difficulty breathing  . Swelling of your face and throat  . A fast heartbeat  . A bad rash all over your body  . Dizziness and weakness    Immunizations Administered    Name Date Dose VIS Date Route   Pfizer COVID-19 Vaccine 12/28/2019 11:33 AM 0.3 mL 11/15/2019 Intramuscular   Manufacturer: [place]   Lot: [id number]   [place]: [id number]

## 2020-01-08 ENCOUNTER — Ambulatory Visit (HOSPITAL_COMMUNITY)
Admission: RE | Admit: 2020-01-08 | Discharge: 2020-01-08 | Disposition: A | Discharge status: Home / Self Care | Payer: Medicare Other | Source: Ambulatory Visit | Attending: Internal Medicine | Admitting: Internal Medicine

## 2020-01-08 ENCOUNTER — Other Ambulatory Visit: Payer: Self-pay

## 2020-01-08 DIAGNOSIS — Z006 Encounter for examination for normal comparison and control in clinical research program: Secondary | ICD-10-CM | POA: Insufficient documentation

## 2020-01-08 MED ORDER — STUDY - FIBROGEN - PAMREVLUMAB 10 MG/ML IV INFUSION (OPEN LABEL) (PI-RAMASWAMY)
30.0000 mg/kg | Freq: Once | INTRAVENOUS | Status: AC
  Administered 2020-01-08: 3710 mg via INTRAVENOUS
  Filled 2020-01-08: qty 371

## 2020-01-08 NOTE — Research (Addendum)
Open Label Extension of the  FGCL-3019-091 (FibroGen Study) is a Phase 3, randomized, double-blind, placebo-controlled multicenter international study to evaluate evaluate the efficacy and safety of 30 mg/kg IV infusions of pamrevlumab administered every 3 weeks for 52 weeks as compared to placebo in subjects with Idiopathic Pulmonary Fibrosis. Primary end point is: change in FVC from baseline at week [place] / Research RN note : This visit for Subject Shane [name] with DOB: September 24, 1941 on 01/08/2020 for the above protocol is Visit/Encounter #OLE Ex Week 9 and is for purpose of research. The consent for this encounter is under Protocol Version 5 and is currently IRB approved. Subject expressed continued interest and consent in continuing as a study subject. Subject confirmed that there was no change in contact information (e.g. address, telephone, email). Subject thanked for participation in research and contribution to science.   During this visit 01/08/2020, the subject will be evaluated by investigator named Dr. Chase Caller . This research coordinator has verified that the investigator is up to date with his/her training logs. Subject signed the "Informed Consent Form for Subject Participating in the Open Label Extension Phase" (Version [id number]) at this visit to continue in the open label extension. PI Dr. Brand Males was unable to be physically present during the visit due to COVID-19 restrictions, but was present via video conference for the re-consent process. All of the subject's questions were answered by the PI and study coordinator. Dr. Chase Caller signed the consent after the visit as a late entry. All procedures were completed according to the above mentioned protocol. Please refer to the subject's paper source binder for further documentation of the visit.  Signed by  [place] Assistant [facility]

## 2020-01-08 NOTE — Research (Signed)
LATE ENTRY  Open Label Extension of the  FGCL-3019-091 (FibroGen Study) is a Phase 3, randomized, double-blind, placebo-controlled multicenter international study to evaluate evaluate the efficacy and safety of 30 mg/kg IV infusions of pamrevlumab administered every 3 weeks for 52 weeks as compared to placebo in subjects with Idiopathic Pulmonary Fibrosis. Primary end point is: change in FVC from baseline at week [place] / Research RN note : This visit for Subject Shane [name] with DOB: 1941-05-22 on 12/20/2019 for the above protocol is Visit/Encounter #OLE Ex Week 6 and is for purpose of research. The consent for this encounter is under Protocol Version 4 and is currently IRB approved. Subject expressed continued interest and consent in continuing as a study subject. Subject confirmed that there was no change in contact information (e.g. address, telephone, email). Subject thanked for participation in research and contribution to science.   In this visit 12/20/2019 the subject will be evaluated by investigator named Dr. Chase Caller . This research coordinator has verified that the investigator is up to date with his/her training logs All procedures were completed according to the above mentioned protocol. Please refer to the subjects paper source binder for further documentation of the visit.      Signed by  [place] Assistant [facility]

## 2020-01-08 NOTE — Research (Signed)
LATE ENTRY  Open Label Extension of theFGCL-3019-091 (FibroGen Study) is a Phase 3, randomized, double-blind, placebo-controlled multicenter international study to evaluate evaluate the efficacy and safety of 30 mg/kg IV infusions of pamrevlumab administered every 3 weeks for 52 weeks as compared to placebo in subjects with Idiopathic Pulmonary Fibrosis. Primary end point is: change in FVC from baseline at week [place] / Research RN note : This visit for SubjectJames H Routhwith DOB: 1942/04/13on 12/23/2020for the above protocol is Visit/Encounter #OLE Ex Week 3and is for purpose of research. The consent for this encounter is under Protocol Version 4andiscurrently IRB approved.Subjectexpressed continued interest and consent in continuing as a study subject. Subject confirmed that there was nochange in contact information (e.g. address, telephone, email). Subject thanked for participation in research and contribution to science.   In this visit 12/23/2020the subject will be evaluated by investigator named Dr. Chase Caller. This research coordinator has verified that the investigator isup to date with his/her training logs. All procedures were completed according to the above mentioned protocol. Please refer to the subjects paper source binder for further documentation of the visit.

## 2020-01-08 NOTE — Addendum Note (Signed)
Encounter addended by: Adonis Huguenin on: 01/08/2020 12:33 PM  Actions taken: Clinical Note Signed

## 2020-01-18 ENCOUNTER — Ambulatory Visit: Payer: Medicare Other | Attending: Internal Medicine

## 2020-01-18 DIAGNOSIS — Z23 Encounter for immunization: Secondary | ICD-10-CM | POA: Insufficient documentation

## 2020-01-18 NOTE — Progress Notes (Signed)
   Covid-19 Vaccination Clinic  Name:  MARVIS SAEFONG    MRN: [id number] DOB: 27-Jan-1941  01/18/2020  Mr. Dyment was observed post Covid-19 immunization for 15 minutes without incidence. He was provided with Vaccine Information Sheet and instruction to access the V-Safe system.   Mr. Kunesh was instructed to call 911 with any severe reactions post vaccine: Marland Kitchen Difficulty breathing  . Swelling of your face and throat  . A fast heartbeat  . A bad rash all over your body  . Dizziness and weakness    Immunizations Administered    Name Date Dose VIS Date Route   Pfizer COVID-19 Vaccine 01/18/2020  9:42 AM 0.3 mL 11/15/2019 Intramuscular   Manufacturer: [place]   Lot: [id number]   [place]: [id number]

## 2020-01-31 ENCOUNTER — Other Ambulatory Visit: Payer: Self-pay

## 2020-01-31 ENCOUNTER — Encounter: Payer: Medicare Other | Admitting: Internal Medicine

## 2020-01-31 ENCOUNTER — Ambulatory Visit (HOSPITAL_COMMUNITY)
Admission: RE | Admit: 2020-01-31 | Discharge: 2020-01-31 | Disposition: A | Discharge status: Home / Self Care | Payer: Medicare Other | Source: Ambulatory Visit | Attending: Internal Medicine | Admitting: Internal Medicine

## 2020-01-31 DIAGNOSIS — Z006 Encounter for examination for normal comparison and control in clinical research program: Secondary | ICD-10-CM | POA: Insufficient documentation

## 2020-01-31 DIAGNOSIS — J84112 Idiopathic pulmonary fibrosis: Secondary | ICD-10-CM

## 2020-01-31 MED ORDER — STUDY - FIBROGEN - PAMREVLUMAB 10 MG/ML IV INFUSION (OPEN LABEL) (PI-RAMASWAMY)
30.0000 mg/kg | Freq: Once | INTRAVENOUS | Status: AC
  Administered 2020-01-31: 3710 mg via INTRAVENOUS
  Filled 2020-01-31: qty 371

## 2020-01-31 NOTE — Research (Signed)
Title: FGCL-3019-091 Open-Label Extension (OLE) is a multi-center, single-arm, open-label extension (OLE) phase where subjects who complete the Week 48 visit of the main study are eligible to participate. The extension to the main study allows for the continued evaluation of the efficacy and safety of 30 mg/kg IV infusions of pamrevlumab administered every 3 weeks for 52 weeks in subjects with Idiopathic Pulmonary Fibrosis.  Primary endpoint is: To provide continued access to pamrevlumab in subjects with IPF who completed the Week 48 visit of the main study  Protocol #: FGCL-3019-091, Clinical Trials #: [id number] Sponsor: [email or web address].[email or web address]  ([place], [place], [place])  Protocol Version for 01/31/2020 date of 04/NOV/2021 Consent Version for 01/31/2020 date of 08/FEB/2021 Investigator Brochure Version for 01/31/2020 date of 30/SEP/2021  Key Features of Pamrevlumab (FG-3019) the study drug: a recombinant fully human IgG kappa monoclonal antibody that binds to CTGF and is being developed for treatment of diseases in which tissue fibrosis has a major pathogenic role. In particular, pamrevlumab appears to disrupt a CTGF autocrine loop in mesenchymal cells like myofibroblasts that reduces their recruitment of leukocytes like macrophages, mast and dendritic cells via chemokine secretion. This disruption results in collapse of the cellular crosstalk that drives tissue remodeling.  Key Inclusion Criteria Completed the Week 48 visit of the main study.   The investigator considers the subject to be suitable for continued participation in the OLE phase, including ability to comply with study visits every 3 weeks for pamrevlumab infusions.  A signed and dated IRB/IEC-approved informed consent must be obtained before dosing start, and preferably at the Week 48 visit of the main study.  Male subjects with partners of childbearing potential and male subjects of childbearing potential (including those <1 year  postmenopausal) must continue to use highly effective contraception methods until 3 months after the last dose of study drug in the OLE phase.   Key Exclusion Criteria Male subjects who are pregnant or nursing.  Use of any investigational drugs, or participation in a clinical trial with an investigational new drug, other than participation in the main study.  History of allergic or anaphylactic reaction to human, humanized, chimeric or murine monoclonal antibodies, or experienced an allergic or anaphylactic reaction to study drug or to any component of the excipient while participating in the main study.  Subjects who withdrew informed consent while participating in the main study.  The investigator judges that the subject is not suitable for participation, or is unable to fully participate in the OLE phase and complete it for any reason, including inability to comply with study procedures and treatment, or any other relevant medical or psychiatric conditions.   Mechanism of Action Pamrevlumab is a human recombinant IgG monoclonal antibody that binds to connective tissue growth factor (CTGF).  CTGF plays a role by mediating the process of fibrosis. By binding to CTGF, pamrevlumab blocks its biologic activity; thereby preventing cell proliferation, adhesion and migration of growth factors involved in fibrotic changes. It is also being studied in other conditions where fibrotic changes play a role; liver fibrosis, Duchenne muscular dystrophy and certain cancers.   Half-life 58-141 hours; t1/2 increases with increasing doses   Interactions No known drug interactions. All concomitant medications to be reviewed by PI.  Safety Data . 624 subjects have been exposed to pamrevlumab, 270 with IPF . The most common treatment emergent adverse events (TEAEs) in all subjects with IPF include: cough, fatigue, dyspnea, upper respiratory tract infection, bronchitis, nasopharyngitis . No known effect on qtc  prolongation, renal or hepatic  issues  Scientist, physiological / Research RN note : This visit for Subject Shane [name] with DOB: 11-12-1941 on 01/31/2020 for the above protocol is Visit/Encounter # EX 12 and is for purpose of research. The consent for this encounter is under Protocol Version Amendment 5 (04/NOV/2021) and is currently IRB approved. Subject expressed continued interest and consent in continuing as a study subject. Subject confirmed that there was no change in contact information (e.g. address, telephone, email). Subject thanked for participation in research and contribution to science.   During this visit on 01/31/2020, all procedures were completed according to the above mentioned protocol. Please refer to the subjects paper source binder for further documentation of the visit.  Signed by  [place] Assistant [facility]  [place], [place] 2:14 PM 01/31/2020

## 2020-01-31 NOTE — Research (Signed)
IPF-PRO Registry  Purpose: To collect data and biological samples that will support future research studies.  The goal of the registry is to research the current approaches to diagnosis, treatment, and progression of IPF. In addition, the registry will analyze participant characteristics, assess quality of life, describe participants interactions with the health care system, determine IPF treatment practices across multiple institutions, and utilize biological samples to identify disease biomarkers.   Clinical Research Coordinator / Research RN note : This visit for Subject Shane [name] with DOB: October 11, 1941 on 01/31/2020 for the above protocol is Visit/Encounter # 36 Month (Visit 6) and is for purpose of research. The consent for this encounter is under Protocol Version Amendment 4 [phone] [id number]) and is currently IRB approved. Subject expressed continued interest and consent in continuing as a study subject. Subject confirmed that there was no change in contact information (e.g. address, telephone, email). Subject thanked for participation in research and contribution to science.   During this visit, the subject completed the blood work and questionnaires per the above referenced protocol. Please refer to the subject's paper source binder for further details.  Signed by  [place] Assistant [facility]  [place], [place] 1:53 PM 01/31/2020

## 2020-02-21 ENCOUNTER — Other Ambulatory Visit: Payer: Self-pay

## 2020-02-21 ENCOUNTER — Ambulatory Visit (HOSPITAL_COMMUNITY)
Admission: RE | Admit: 2020-02-21 | Discharge: 2020-02-21 | Disposition: A | Discharge status: Home / Self Care | Payer: Medicare Other | Source: Ambulatory Visit | Attending: Internal Medicine | Admitting: Internal Medicine

## 2020-02-21 DIAGNOSIS — Z006 Encounter for examination for normal comparison and control in clinical research program: Secondary | ICD-10-CM | POA: Insufficient documentation

## 2020-02-21 DIAGNOSIS — J84112 Idiopathic pulmonary fibrosis: Secondary | ICD-10-CM

## 2020-02-21 MED ORDER — STUDY - FIBROGEN - PAMREVLUMAB 10 MG/ML IV INFUSION (OPEN LABEL) (PI-RAMASWAMY)
30.0000 mg/kg | Freq: Once | INTRAVENOUS | Status: AC
  Administered 2020-02-21: 3710 mg via INTRAVENOUS
  Filled 2020-02-21: qty 371

## 2020-02-24 NOTE — Research (Signed)
Title: [id number] Open-Label Extension (OLE) is a multi-center, single-arm, open-label extension (OLE) phase where subjects who complete the Week 48 visit of the main study are eligible to participate. The extension to the main study allows for the continued evaluation of the efficacy and safety of 30 mg/kg IV infusions of pamrevlumab administered every 3 weeks for 52 weeks in subjects with Idiopathic Pulmonary Fibrosis.  Primary endpoint is: To provide continued access to pamrevlumab in subjects with IPF who completed the Week 48 visit of the main study  Protocol #: FGCL-3019-091, Clinical Trials #: [id number] Sponsor: [email or web address]  ([place], [place], [place])   Protocol Version for 02/21/2020 date of 04NOV2020 Consent Version for 02/21/2020 date of 03DEC2020 Investigator Brochure Version for 02/21/2020 date of 30SEP2020  Key Features of Pamrevlumab (FG-3019) the study drug: a recombinant fully human IgG kappa monoclonal antibody that binds to CTGF and is being developed for treatment of diseases in which tissue fibrosis has a major pathogenic role. In particular, pamrevlumab appears to disrupt a CTGF autocrine loop in mesenchymal cells like myofibroblasts that reduces their recruitment of leukocytes like macrophages, mast and dendritic cells via chemokine secretion. This disruption results in collapse of the cellular crosstalk that drives tissue remodeling.  Clinical Research Coordinator / Research RN note : This visit for Subject Shane [name] with DOB: [birth year] on 02/21/2020 for the above protocol is Visit/Encounter # EX 15 and is for purpose of research. The consent for this encounter is under Protocol Version Amendment 5 (04/NOV/2020) and is currently IRB approved. Subject expressed continued interest and consent in continuing as a study subject. Subject confirmed that there was no change in contact information (e.g. address, telephone, email). Subject thanked for participation in research and  contribution to science.   During this visit on 02/21/2020, all procedures were completed according to the above mentioned protocol. Please refer to the subjects paper source binder for further documentation of the visit.  Signed by  Verline Lema Research Assistant [facility]  [place], [place] 1:22 PM 02/24/2020

## 2020-02-25 ENCOUNTER — Telehealth: Payer: Self-pay | Admitting: Internal Medicine

## 2020-02-25 NOTE — Telephone Encounter (Signed)
Verline Lema J  Wash Nienhaus, Waldemar Dickens, CMA  Hi Raquel Sarna,   Per MR, can you please schedule a [facility] appointment for Mr. Doyel with MR? MR would like this appointment to be in May 2021.   Thanks,   Verline Lema    Called and spoke with pt's wife [name] in regards to message received from [place] with [employer]. I have scheduled pt a visit with MR 5/4. Nothing further needed.

## 2020-03-12 ENCOUNTER — Encounter: Payer: Self-pay | Admitting: Cardiology

## 2020-03-12 ENCOUNTER — Other Ambulatory Visit: Payer: Self-pay

## 2020-03-12 ENCOUNTER — Telehealth (INDEPENDENT_AMBULATORY_CARE_PROVIDER_SITE_OTHER): Payer: Medicare Other | Admitting: Cardiology

## 2020-03-12 VITALS — BP 130/70 | HR 69 | Ht 72.0 in | Wt 268.0 lb

## 2020-03-12 DIAGNOSIS — I350 Nonrheumatic aortic (valve) stenosis: Secondary | ICD-10-CM | POA: Insufficient documentation

## 2020-03-12 DIAGNOSIS — I2510 Atherosclerotic heart disease of native coronary artery without angina pectoris: Secondary | ICD-10-CM

## 2020-03-12 DIAGNOSIS — I495 Sick sinus syndrome: Secondary | ICD-10-CM | POA: Diagnosis not present

## 2020-03-12 DIAGNOSIS — J849 Interstitial pulmonary disease, unspecified: Secondary | ICD-10-CM | POA: Diagnosis not present

## 2020-03-12 DIAGNOSIS — Z9641 Presence of insulin pump (external) (internal): Secondary | ICD-10-CM

## 2020-03-12 DIAGNOSIS — J84112 Idiopathic pulmonary fibrosis: Secondary | ICD-10-CM

## 2020-03-12 DIAGNOSIS — I10 Essential (primary) hypertension: Secondary | ICD-10-CM

## 2020-03-12 DIAGNOSIS — I452 Bifascicular block: Secondary | ICD-10-CM

## 2020-03-12 DIAGNOSIS — E118 Type 2 diabetes mellitus with unspecified complications: Secondary | ICD-10-CM

## 2020-03-12 NOTE — Patient Instructions (Signed)
Medication Instructions:   Your physician recommends that you continue on your current medications as directed. Please refer to the Current Medication list given to you today.  *If you need a refill on your cardiac medications before your next appointment, please call your pharmacy*  Lab Work:  None ordered today  Testing/Procedures:  None ordered today  Follow-Up: At [facility], you and your health needs are our priority.  As part of our continuing mission to provide you with exceptional heart care, we have created designated Provider Care Teams.  These Care Teams include your primary Cardiologist (physician) and Advanced Practice Providers (APPs -  Physician Assistants and Nurse Practitioners) who all work together to provide you with the care you need, when you need it.  We recommend signing up for the patient portal called "MyChart".  Sign up information is provided on this After Visit Summary.  MyChart is used to connect with patients for Virtual Visits (Telemedicine).  Patients are able to view lab/test results, encounter notes, upcoming appointments, etc.  Non-urgent messages can be sent to your provider as well.   To learn more about what you can do with MyChart, go to [email or web address].    Your next appointment:   6 month(s)  The format for your next appointment:   In Person  Provider:   Kirk Ruths, MD

## 2020-03-12 NOTE — Progress Notes (Signed)
Virtual Visit via Telephone Note   This visit type was conducted due to national recommendations for restrictions regarding the COVID-19 Pandemic (e.g. social distancing) in an effort to limit this patient's exposure and mitigate transmission in our community.  Due to his co-morbid illnesses, this patient is at least at moderate risk for complications without adequate follow up.  This format is felt to be most appropriate for this patient at this time.  The patient did not have access to video technology/had technical difficulties with video requiring transitioning to audio format only (telephone).  All issues noted in this document were discussed and addressed.  No physical exam could be performed with this format.  Please refer to the patient's chart for his  consent to telehealth for [facility].   The patient was identified using 2 identifiers.  Date:  03/12/2020   ID:  Shane [name], DOB 27-Jan-1941, MRN [id number]  Patient Location: Home Provider Location: Home  PCP:  Burnard Bunting, MD  Cardiologist:  Dr Stanford Breed Electrophysiologist:  None   Evaluation Performed:  Follow-Up Visit  Chief Complaint:  none  History of Present Illness:    Shane [name] is a 79 y.o. male with a history of moderate coronary disease at catheterization in 2012, transient [place] Amber after a VATS in January 18, interstitial lung disease currently enrolled in a treatment trial, hypertension with some labile readings, insulin-dependent diabetes, and right bundle branch block and left anterior fascicular block.  Was last seen by [name] in June 2019.  He has had hypertension with episodes of hypotension and syncope.  He has had transient bradycardia rhythms and was taken off beta-blockers.  He has chronic renal insufficiency stage III, his last serum creatinine in epic was 1.5 in December 2019.  He is followed by Dr. Joya Salm, his wife works with Dr. Joya Salm.  He was seen by Dr Chase Caller in Nov 2020  and he ordered an echo to f/u previously documented mild AS.  Echo done Nov 2020 showed normal LVF, grade 2 DD, and moderate AS with a mean gradient of 17 mmHg, peak 29.6 mmHg.   Patient was contacted today for routine cardiac follow-up.  He says he is doing well from a cardiac standpoint.  He feels like his medications have been adjusted and he is no longer had issues with syncope or near syncope.  He is enrolled in a study for interstitial lung disease treatment and he says he is done well, his disease is not progressed in the last year or so.  He has noticed occasionally his heart rate will jump up to the 130 but then come back down.  He is not had sustained tachycardia.  His blood pressure is also been somewhat labile but generally he runs around 557 systolic.  The patient does not have symptoms concerning for COVID-19 infection (fever, chills, cough, or new shortness of breath).    Past Medical History:  Diagnosis Date  . Cancer ([place])    ling  . Cataract   . Chest pain   . Chronic kidney disease    d/t DM  . Diabetes mellitus    Vgo disposal insulin bolus  simular to insulin pump  . Dyspnea   . GERD (gastroesophageal reflux disease)   . Hyperlipidemia   . Hypertension   . Idiopathic pulmonary fibrosis ([place]) 11/2016  . Neuromuscular disorder ([place])   . PONV (postoperative nausea and vomiting)   . Sleep apnea     uses cpap asked to  bring mask and tubing   Past Surgical History:  Procedure Laterality Date  . ANTERIOR FUSION CERVICAL SPINE  2012  . CARDIAC CATHETERIZATION  2011  . CARDIAC CATHETERIZATION N/A 11/09/2016   Procedure: Right Heart Cath;  Surgeon: Belva Crome, MD;  Location: [facility];  Service: Cardiovascular;  Laterality: N/A;  . carpel tunnel     left wrist  . CATARACT EXTRACTION W/ INTRAOCULAR LENS  IMPLANT, BILATERAL  2013  . CERVICAL LAMINECTOMY  2012  . COLONOSCOPY N/A 01/14/2013   Procedure: COLONOSCOPY;  Surgeon: Irene Shipper, MD;  Location: [facility]  ENDOSCOPY;  Service: Endoscopy;  Laterality: N/A;  . EYE SURGERY    . [place]   left  . LUMBAR LAMINECTOMY  2003  . LUNG BIOPSY Left 12/26/2016   Procedure: LUNG BIOPSY;  Surgeon: [name], MD;  Location: [facility];  Service: Thoracic;  Laterality: Left;  . POSTERIOR FUSION CERVICAL SPINE  2012  . TRIGGER FINGER RELEASE  2011   4th finger left hand  . VIDEO ASSISTED THORACOSCOPY Left 12/26/2016   Procedure: VIDEO ASSISTED THORACOSCOPY;  Surgeon: [name], MD;  Location: [facility];  Service: Thoracic;  Laterality: Left;  Marland Kitchen VIDEO BRONCHOSCOPY N/A 12/26/2016   Procedure: VIDEO BRONCHOSCOPY;  Surgeon: [name], MD;  Location: [facility];  Service: Thoracic;  Laterality: N/A;     Current Meds  Medication Sig  . Ascorbic Acid (VITAMIN C) 1000 MG tablet Take 1,000 mg by mouth daily.  Marland Kitchen aspirin 81 MG tablet Take 81 mg by mouth daily.    . clotrimazole-betamethasone (LOTRISONE) cream Apply 1 application topically daily as needed (for skin infection).   . Continuous Blood Gluc Sensor (FREESTYLE LIBRE 14 DAY SENSOR) MISC 1 patch every 14 (fourteen) days.  . diazepam (VALIUM) 5 MG tablet Take 5 mg by mouth at bedtime as needed (for sleep).   . EPINEPHrine 0.3 mg/0.3 mL IJ SOAJ injection Inject 0.3 mg into the muscle once as needed (for anaphylactic reaction).   . furosemide (LASIX) 40 MG tablet Take 40 mg by mouth daily.  . insulin detemir (LEVEMIR) 100 UNIT/ML injection Inject 22 Units into the skin at bedtime.  . insulin lispro (HUMALOG) 100 UNIT/ML injection Inject 40 Units into the skin daily. Use with V-Go 40   . KLOR-CON M20 20 MEQ tablet Take 20 mEq by mouth daily.   Marland Kitchen lisinopril (PRINIVIL,ZESTRIL) 20 MG tablet Take 20 mg by mouth daily.   Marland Kitchen losartan (COZAAR) 50 MG tablet Take 50 mg by mouth daily.  . Multiple Vitamin (MULTIVITAMIN) tablet Take 1 tablet by mouth daily.    . pantoprazole (PROTONIX) 40 MG tablet Take 40 mg by mouth daily.    Marland Kitchen PROAIR HFA 108  (90 Base) MCG/ACT inhaler Inhale 2 puffs into the lungs every 4 (four) hours as needed for wheezing or shortness of breath.      Allergies:   Codeine, Other, and Ofev [nintedanib]   Social History   Tobacco Use  . Smoking status: Never Smoker  . Smokeless tobacco: Never Used  Substance Use Topics  . Alcohol use: No  . Drug use: No     Family Hx: The patient's family history includes Diabetes Mellitus II in his mother; Emphysema (age of onset: 62) in his father. There is no history of Colon cancer, Esophageal cancer, Rectal cancer, or Stomach cancer.  ROS:   Please see the history of present illness.    All other systems reviewed  and are negative.   Prior CV studies:   The following studies were reviewed today:   Labs/Other Tests and Data Reviewed:    EKG:  An ECG dated 08/17/2019 was personally reviewed today and demonstrated:  NSR, HR 63, PR-243, RBBB, LAFB  Recent Labs: No results found for requested labs within last 8760 hours.   Recent Lipid Panel Lab Results  Component Value Date/Time   CHOL  02/04/2011 05:30 AM    127        ATP III CLASSIFICATION:  <200     mg/dL   Desirable  200-239  mg/dL   Borderline High  >=240    mg/dL   High          TRIG 201 (H) 02/04/2011 05:30 AM   HDL 29 (L) 02/04/2011 05:30 AM   CHOLHDL 4.4 02/04/2011 05:30 AM   LDLCALC  02/04/2011 05:30 AM    58        Total Cholesterol/HDL:CHD Risk Coronary Heart Disease Risk Table                     Men   Women  1/2 Average Risk   3.4   3.3  Average Risk       5.0   4.4  2 X Average Risk   9.6   7.1  3 X Average Risk  23.4   11.0        Use the calculated Patient Ratio above and the CHD Risk Table to determine the patient's CHD Risk.        ATP III CLASSIFICATION (LDL):  <100     mg/dL   Optimal  100-129  mg/dL   Near or Above                    Optimal  130-159  mg/dL   Borderline  160-189  mg/dL   High  >190     mg/dL   Very High    Wt Readings from Last 3 Encounters:    03/12/20 268 lb (121.6 kg)  02/21/20 268 lb (121.6 kg)  01/08/20 271 lb (122.9 kg)     Objective:    Vital Signs:  BP 130/70   Pulse 69   Ht 6' (1.829 m)   Wt 268 lb (121.6 kg)   BMI 36.35 kg/m    VITAL SIGNS:  reviewed  ASSESSMENT & PLAN:    Moderate AS- Echo nov 2020- probably not contributing to his dyspnea.   ILD- Followed by [facility]- Dr Chase Caller.  CAD- Moderate CFX and PDA disease per records-2012. Myoview low risk Nov 2017. No h/o angina.  SSS- Patient has had Wenckebach on beta blocker Rx in the past.  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  He describes brief episodes of tachycardia.  Would not treat at this time unless he has sustained symptomatic tachycardia.   Plan:  He wasn't sure about his medications-"my wife handles those".  No changes for now- f/u with Dr Stanford Breed in 6 months.  I'll request office records from [facility].   COVID-19 Education: The signs and symptoms of COVID-19 were discussed with the patient and how to seek care for testing (follow up with PCP or arrange E-visit).  The importance of social distancing was discussed today.  Time:   Today, I have spent 15 minutes with the patient with telehealth technology discussing the above problems.     Medication Adjustments/Labs and Tests Ordered: Current medicines  are reviewed at length with the patient today.  Concerns regarding medicines are outlined above.   Tests Ordered: No orders of the defined types were placed in this encounter.   Medication Changes: No orders of the defined types were placed in this encounter.   Follow Up:  As scheduled with Dr Stanford Breed  Signed, Kerin Ransom, PA-C  03/12/2020 2:53 PM    [place]

## 2020-03-13 ENCOUNTER — Ambulatory Visit (HOSPITAL_COMMUNITY)
Admission: RE | Admit: 2020-03-13 | Discharge: 2020-03-13 | Disposition: A | Discharge status: Home / Self Care | Payer: Medicare Other | Source: Ambulatory Visit | Attending: Internal Medicine | Admitting: Internal Medicine

## 2020-03-13 DIAGNOSIS — J84112 Idiopathic pulmonary fibrosis: Secondary | ICD-10-CM | POA: Insufficient documentation

## 2020-03-13 DIAGNOSIS — Z006 Encounter for examination for normal comparison and control in clinical research program: Secondary | ICD-10-CM | POA: Diagnosis not present

## 2020-03-13 MED ORDER — STUDY - FIBROGEN - PAMREVLUMAB 10 MG/ML IV INFUSION (OPEN LABEL) (PI-RAMASWAMY)
30.0000 mg/kg | Freq: Once | INTRAVENOUS | Status: AC
  Administered 2020-03-13: 3710 mg via INTRAVENOUS
  Filled 2020-03-13: qty 371

## 2020-03-13 NOTE — Research (Signed)
Title: FGCL-3019-091 Open-Label Extension (OLE) is a multi-center, single-arm, open-label extension (OLE) phase where subjects who complete the Week 48 visit of the main study are eligible to participate. The extension to the main study allows for the continued evaluation of the efficacy and safety of 30 mg/kg IV infusions of pamrevlumab administered every 3 weeks for 52 weeks in subjects with Idiopathic Pulmonary Fibrosis.  Primary endpoint is: To provide continued access to pamrevlumab in subjects with IPF who completed the Week 48 visit of the main study  Protocol #: [id number], Clinical Trials #: [id number] Sponsor: [email or web address]  ([place], [place], [place])  Protocol Version for 03/13/2020 date of 04NOV2020 Consent Version for 03/13/2020 date of 30SEP2020 Investigator Brochure Version for 03/13/2020 date of 03DEC2020   Clinical Research Coordinator / Research RN note : This visit for Subject Shane [name] with DOB: [birth year] on 03/13/2020 for the above protocol is Visit/Encounter # EX Week 18 and is for purpose of research. The consent for this encounter is under Protocol Version Amendment 5.0 ([id number]) and is currently IRB approved. Subject expressed continued interest and consent in continuing as a study subject. Subject confirmed that there was no change in contact information (e.g. address,telephone, email). Subject thanked for participation in research and contribution to science.   During this visit on 03/13/2020, all procedures were completed according to the above mentioned protocol. Please refer to the subjects paper source binder for further documentation of the visit.  Signed by  [place] Assistant [facility]  [place], [place] 11:13 AM 03/13/2020

## 2020-03-16 ENCOUNTER — Encounter (INDEPENDENT_AMBULATORY_CARE_PROVIDER_SITE_OTHER): Payer: Self-pay | Admitting: Ophthalmology

## 2020-03-16 ENCOUNTER — Other Ambulatory Visit: Payer: Self-pay

## 2020-03-16 ENCOUNTER — Ambulatory Visit (INDEPENDENT_AMBULATORY_CARE_PROVIDER_SITE_OTHER): Payer: Medicare Other | Admitting: Ophthalmology

## 2020-03-16 DIAGNOSIS — E113591 Type 2 diabetes mellitus with proliferative diabetic retinopathy without macular edema, right eye: Secondary | ICD-10-CM | POA: Insufficient documentation

## 2020-03-16 DIAGNOSIS — H35371 Puckering of macula, right eye: Secondary | ICD-10-CM | POA: Insufficient documentation

## 2020-03-16 DIAGNOSIS — Z794 Long term (current) use of insulin: Secondary | ICD-10-CM | POA: Diagnosis not present

## 2020-03-16 DIAGNOSIS — E113592 Type 2 diabetes mellitus with proliferative diabetic retinopathy without macular edema, left eye: Secondary | ICD-10-CM | POA: Insufficient documentation

## 2020-03-16 DIAGNOSIS — H43811 Vitreous degeneration, right eye: Secondary | ICD-10-CM | POA: Insufficient documentation

## 2020-03-16 DIAGNOSIS — E113593 Type 2 diabetes mellitus with proliferative diabetic retinopathy without macular edema, bilateral: Secondary | ICD-10-CM | POA: Insufficient documentation

## 2020-03-16 NOTE — Assessment & Plan Note (Signed)
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

## 2020-03-16 NOTE — Assessment & Plan Note (Signed)
PDR is quiesced sent will observeThe nature of regressed proliferative diabetic retinopathy was discussed with the patient. The patient was advised to maintain good glucose, blood pressure, monitor kidney function and serum lipid control as advised by personal physician. Rare risk for reactivation of progression exist with untreated severe anemia, untreated renal failure, untreated heart failure, and smoking. Complete avoidance of smoking was recommended. The chance of recurrent proliferative diabetic retinopathy was discussed as well as the chance of vitreous hemorrhage for which further treatments may be necessary.

## 2020-03-16 NOTE — Progress Notes (Signed)
03/16/2020     CHIEF COMPLAINT Patient presents for Diabetic Eye Exam   HISTORY OF PRESENT ILLNESS: Shane [name] is a 79 y.o. male who presents to the clinic today for:   HPI    Diabetic Eye Exam    Vision is stable.  Associated Symptoms Negative for Flashes and Floaters.  Diabetes characteristics include Type 2.  This started 40 years ago.  Last Blood Glucose 118.  Last A1C 6.9.          Comments    9 month f.u - DM2  Patient denies change in vision and overall has no complaints. OCT OU and FP OU LBS 118 this AM A1C 6.9 3 weeks ago       Last edited by Gerda Diss, [place] on 03/16/2020  8:11 AM. (History)      Referring physician: Burnard Bunting, MD [place],  [place] [id number]  HISTORICAL INFORMATION:   Selected notes from the MEDICAL RECORD NUMBER    Lab Results  Component Value Date   HGBA1C 7.9 (H) 12/19/2016     CURRENT MEDICATIONS: No current outpatient medications on file. (Ophthalmic Drugs)   No current facility-administered medications for this visit. (Ophthalmic Drugs)   Current Outpatient Medications (Other)  Medication Sig  . Ascorbic Acid (VITAMIN C) 1000 MG tablet Take 1,000 mg by mouth daily.  Marland Kitchen aspirin 81 MG tablet Take 81 mg by mouth daily.    . clotrimazole-betamethasone (LOTRISONE) cream Apply 1 application topically daily as needed (for skin infection).   . Continuous Blood Gluc Sensor (FREESTYLE LIBRE 14 DAY SENSOR) MISC 1 patch every 14 (fourteen) days.  . diazepam (VALIUM) 5 MG tablet Take 5 mg by mouth at bedtime as needed (for sleep).   . EPINEPHrine 0.3 mg/0.3 mL IJ SOAJ injection Inject 0.3 mg into the muscle once as needed (for anaphylactic reaction).   . furosemide (LASIX) 40 MG tablet Take 40 mg by mouth daily.  . insulin detemir (LEVEMIR) 100 UNIT/ML injection Inject 22 Units into the skin at bedtime.  . insulin lispro (HUMALOG) 100 UNIT/ML injection Inject 40 Units into the skin daily. Use with V-Go 40   .  KLOR-CON M20 20 MEQ tablet Take 20 mEq by mouth daily.   Marland Kitchen lisinopril (PRINIVIL,ZESTRIL) 20 MG tablet Take 20 mg by mouth daily.   Marland Kitchen losartan (COZAAR) 50 MG tablet Take 50 mg by mouth daily.  . Multiple Vitamin (MULTIVITAMIN) tablet Take 1 tablet by mouth daily.    . pantoprazole (PROTONIX) 40 MG tablet Take 40 mg by mouth daily.    Marland Kitchen PROAIR HFA 108 (90 Base) MCG/ACT inhaler Inhale 2 puffs into the lungs every 4 (four) hours as needed for wheezing or shortness of breath.    No current facility-administered medications for this visit. (Other)      REVIEW OF SYSTEMS:    ALLERGIES Allergies  Allergen Reactions  . Codeine Hives and Itching  . Other Diarrhea and Other (See Comments)    Esbriet causes elevated LFTs. D/C on 06/14/17 and SEVERE DIARRHEA  . Ofev [Nintedanib] Diarrhea    SEVERE DIARRHEA    PAST MEDICAL HISTORY Past Medical History:  Diagnosis Date  . Cancer ([place])    ling  . Cataract   . Chest pain   . Chronic kidney disease    d/t DM  . Diabetes mellitus    Vgo disposal insulin bolus  simular to insulin pump  . Dyspnea   . GERD (gastroesophageal reflux disease)   .  Hyperlipidemia   . Hypertension   . 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 Neuromuscular disorder ([place])   . PONV (postoperative nausea and vomiting)   . Sleep apnea     uses cpap asked to bring mask and tubing   Past Surgical History:  Procedure Laterality Date  . ANTERIOR FUSION CERVICAL SPINE  2012  . CARDIAC CATHETERIZATION  2011  . CARDIAC CATHETERIZATION N/A 11/09/2016   Procedure: Right Heart Cath;  Surgeon: Belva Crome, MD;  Location: [facility];  Service: Cardiovascular;  Laterality: N/A;  . carpel tunnel     left wrist  . CATARACT EXTRACTION    . CATARACT EXTRACTION W/ INTRAOCULAR LENS  IMPLANT, BILATERAL  2013  . CERVICAL LAMINECTOMY  2012  . COLONOSCOPY N/A 01/14/2013   Procedure: COLONOSCOPY;  Surgeon: Irene Shipper, MD;  Location: [facility];  Service:  Endoscopy;  Laterality: N/A;  . EYE SURGERY    . [place]   left  . LUMBAR LAMINECTOMY  2003  . LUNG BIOPSY Left 12/26/2016   Procedure: LUNG BIOPSY;  Surgeon: [name], MD;  Location: [facility];  Service: Thoracic;  Laterality: Left;  . POSTERIOR FUSION CERVICAL SPINE  2012  . TRIGGER FINGER RELEASE  2011   4th finger left hand  . VIDEO ASSISTED THORACOSCOPY Left 12/26/2016   Procedure: VIDEO ASSISTED THORACOSCOPY;  Surgeon: [name], MD;  Location: [facility];  Service: Thoracic;  Laterality: Left;  Marland Kitchen VIDEO BRONCHOSCOPY N/A 12/26/2016   Procedure: VIDEO BRONCHOSCOPY;  Surgeon: [name], MD;  Location: [facility] OR;  Service: Thoracic;  Laterality: N/A;    FAMILY HISTORY Family History  Problem Relation Age of Onset  . Diabetes Mellitus II Mother   . Emphysema Father 75  . Colon cancer Neg Hx   . Esophageal cancer Neg Hx   . Rectal cancer Neg Hx   . Stomach cancer Neg Hx     SOCIAL HISTORY Social History   Tobacco Use  . Smoking status: Never Smoker  . Smokeless tobacco: Never Used  Substance Use Topics  . Alcohol use: No  . Drug use: No         OPHTHALMIC EXAM:  Base Eye Exam    Visual Acuity (Snellen - Linear)      Right Left   Dist [place] 20/25-1 20/40-2   Dist ph [place]  20/25+2       Tonometry (Tonopen, 8:18 AM)      Right Left   Pressure 22 19       Pupils      Pupils Dark Light Shape React APD   Right PERRL 4 4 Round Minimal None   Left PERRL 4 3 Round Brisk None       Visual Fields (Counting fingers)      Left Right    Full        Extraocular Movement      Right Left    Full Full       Neuro/Psych    Oriented x3: Yes   Mood/Affect: Normal       Dilation    Both eyes: 1.0% Mydriacyl, 2.5% Phenylephrine @ 8:19 AM        Slit Lamp and Fundus Exam    External Exam      Right Left   External Normal Normal       Slit Lamp Exam      Right Left   Lids/Lashes Normal Normal  Conjunctiva/Sclera White and quiet  White and quiet   Cornea Clear Clear   Anterior Chamber Deep and quiet Deep and quiet   Iris Round and reactive Round and reactive   Lens Posterior chamber intraocular lens Posterior chamber intraocular lens   Anterior Vitreous Normal Normal       Fundus Exam      Right Left   Posterior Vitreous Posterior vitreous detachment Posterior vitreous detachment   Disc Normal Normal   C/D Ratio 0.3 0.25   Macula Microaneurysms, Focal laser scars,,, no macular thickening Microaneurysms, Focal laser scars, no macular thickening   Vessels PDR QUIESCENT PDR QUIESCENT   Periphery Laser scars prp,, room nasally for prp   Laser scars , good prp 360          IMAGING AND PROCEDURES  Imaging and Procedures for 03/16/20  OCT, Retina - OU - Both Eyes       Right Eye Quality was good. Scan locations included subfoveal. Progression has been stable. Findings include outer retinal atrophy, central retinal atrophy, macular pucker, retinal drusen .   Left Eye Quality was good. Scan locations included subfoveal. Progression has been stable. Findings include outer retinal atrophy, central retinal atrophy, retinal drusen .   Notes NO ACTIVE CSME,,  OU,,,    epiretinal membrane OD is nasal to the fovea and not visually significant       Color Fundus Photography Optos - OU - Both Eyes       Right Eye Progression has been stable. Disc findings include normal observations. Macula : normal observations. Periphery : normal observations.   Left Eye Disc findings include normal observations. Macula : normal observations, flat. Periphery : normal observations.   Notes OD with good PRP 360 degrees some room nasally.  No active CS ME  OS with good PRP 360, with clear media.  PDR quiet OU                ASSESSMENT/PLAN:  @PROBAPNOTE @    ICD-10-CM   1. 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 Right epiretinal membrane  H35.371   4. Posterior vitreous detachment of right eye  H43.811     1.  2.  3.  Ophthalmic Meds Ordered this visit:  No orders of the defined types were placed in this encounter.      Return in about 6 months (around 09/15/2020) for dilate, DILATE OU, OCT.  There are no Patient Instructions on file for this visit.   Explained the diagnoses, plan, and follow up with the patient and they expressed understanding.  Patient expressed understanding of the importance of proper follow up care.   Clent Demark Zayonna Ayuso M.D. Diseases & Surgery of the Retina and Vitreous [facility] [place] 03/16/20     Abbreviations: M myopia (nearsighted); A astigmatism; H hyperopia (farsighted); P presbyopia; Mrx spectacle prescription;  CTL contact lenses; OD right eye; OS left eye; OU both eyes  XT exotropia; ET esotropia; PEK punctate epithelial keratitis; PEE punctate epithelial erosions; DES  dry eye syndrome; MGD meibomian gland dysfunction; ATs artificial tears; PFAT's preservative free artificial tears; [place] nuclear sclerotic cataract; PSC posterior subcapsular cataract; ERM epi-retinal membrane; PVD posterior vitreous detachment; RD retinal detachment; DM diabetes mellitus; DR diabetic retinopathy; NPDR non-proliferative diabetic retinopathy; PDR proliferative diabetic retinopathy; CSME clinically significant macular edema; DME diabetic macular edema; dbh dot blot hemorrhages; CWS cotton wool spot; POAG primary open angle glaucoma; C/D cup-to-disc ratio; HVF humphrey visual field; GVF goldmann visual field; OCT optical coherence  tomography; IOP intraocular pressure; BRVO Branch retinal vein occlusion; CRVO central retinal vein occlusion; CRAO central retinal artery occlusion; BRAO branch retinal artery occlusion; RT retinal tear; SB scleral buckle; PPV pars plana vitrectomy; VH Vitreous hemorrhage; PRP panretinal laser photocoagulation; IVK intravitreal kenalog; VMT vitreomacular traction; MH Macular hole;  NVD neovascularization of the disc; NVE neovascularization elsewhere; AREDS age related eye disease study; ARMD age related macular degeneration; POAG primary open angle glaucoma; EBMD epithelial/anterior basement membrane dystrophy; ACIOL anterior chamber intraocular lens; IOL intraocular lens; PCIOL posterior chamber intraocular lens; Phaco/IOL phacoemulsification with intraocular lens placement; [place] photorefractive keratectomy; LASIK laser assisted in situ keratomileusis; HTN hypertension; DM diabetes mellitus; COPD chronic obstructive pulmonary disease

## 2020-03-16 NOTE — Assessment & Plan Note (Signed)
The nature of macular pucker (epiretinal membrane ERM) was discussed with the patient as well as threshold criteria for vitrectomy surgery. I explained that in rare cases another surgery is needed to actually remove a second wrinkle should it regrow.  Most often, the epiretinal membrane and underlying wrinkled internal limiting membrane are removed with the first surgery, to accomplish the goals.   If the operative eye is [place] (natural lens still present), cataract surgery is often recommended prior to Vitrectomy. This will enable the retina surgeon to have the best view during surgery and the patient to obtain optimal results in the future. Treatment options were discussed.  Minor OD, will observe

## 2020-03-27 ENCOUNTER — Encounter (HOSPITAL_COMMUNITY): Payer: Medicare Other

## 2020-03-28 ENCOUNTER — Emergency Department (HOSPITAL_COMMUNITY): Payer: Medicare Other

## 2020-03-28 ENCOUNTER — Inpatient Hospital Stay (HOSPITAL_COMMUNITY)
Admission: EM | Admit: 2020-03-28 | Discharge: 2020-03-31 | DRG: 243 | Disposition: A | Discharge status: Home / Self Care | Payer: Medicare Other | Attending: Internal Medicine | Admitting: Internal Medicine

## 2020-03-28 ENCOUNTER — Encounter (HOSPITAL_COMMUNITY): Payer: Self-pay | Admitting: Internal Medicine

## 2020-03-28 DIAGNOSIS — I441 Atrioventricular block, second degree: Principal | ICD-10-CM | POA: Diagnosis present

## 2020-03-28 DIAGNOSIS — I350 Nonrheumatic aortic (valve) stenosis: Secondary | ICD-10-CM | POA: Diagnosis present

## 2020-03-28 DIAGNOSIS — N183 Chronic kidney disease, stage 3 unspecified: Secondary | ICD-10-CM

## 2020-03-28 DIAGNOSIS — R55 Syncope and collapse: Secondary | ICD-10-CM | POA: Diagnosis present

## 2020-03-28 DIAGNOSIS — Z794 Long term (current) use of insulin: Secondary | ICD-10-CM | POA: Diagnosis not present

## 2020-03-28 DIAGNOSIS — J84112 Idiopathic pulmonary fibrosis: Secondary | ICD-10-CM | POA: Diagnosis present

## 2020-03-28 DIAGNOSIS — E113593 Type 2 diabetes mellitus with proliferative diabetic retinopathy without macular edema, bilateral: Secondary | ICD-10-CM | POA: Diagnosis present

## 2020-03-28 DIAGNOSIS — E1122 Type 2 diabetes mellitus with diabetic chronic kidney disease: Secondary | ICD-10-CM | POA: Diagnosis present

## 2020-03-28 DIAGNOSIS — I442 Atrioventricular block, complete: Secondary | ICD-10-CM | POA: Diagnosis not present

## 2020-03-28 DIAGNOSIS — Z833 Family history of diabetes mellitus: Secondary | ICD-10-CM | POA: Diagnosis not present

## 2020-03-28 DIAGNOSIS — R195 Other fecal abnormalities: Secondary | ICD-10-CM

## 2020-03-28 DIAGNOSIS — I959 Hypotension, unspecified: Secondary | ICD-10-CM | POA: Diagnosis not present

## 2020-03-28 DIAGNOSIS — Z20822 Contact with and (suspected) exposure to covid-19: Secondary | ICD-10-CM | POA: Diagnosis present

## 2020-03-28 DIAGNOSIS — I10 Essential (primary) hypertension: Secondary | ICD-10-CM | POA: Diagnosis present

## 2020-03-28 DIAGNOSIS — N1832 Chronic kidney disease, stage 3b: Secondary | ICD-10-CM | POA: Diagnosis present

## 2020-03-28 DIAGNOSIS — Z79899 Other long term (current) drug therapy: Secondary | ICD-10-CM

## 2020-03-28 DIAGNOSIS — I444 Left anterior fascicular block: Secondary | ICD-10-CM | POA: Diagnosis present

## 2020-03-28 DIAGNOSIS — I2510 Atherosclerotic heart disease of native coronary artery without angina pectoris: Secondary | ICD-10-CM | POA: Diagnosis present

## 2020-03-28 DIAGNOSIS — I5032 Chronic diastolic (congestive) heart failure: Secondary | ICD-10-CM | POA: Diagnosis present

## 2020-03-28 DIAGNOSIS — Z825 Family history of asthma and other chronic lower respiratory diseases: Secondary | ICD-10-CM | POA: Diagnosis not present

## 2020-03-28 DIAGNOSIS — Z6836 Body mass index (BMI) 36.0-36.9, adult: Secondary | ICD-10-CM

## 2020-03-28 DIAGNOSIS — I495 Sick sinus syndrome: Secondary | ICD-10-CM | POA: Diagnosis present

## 2020-03-28 DIAGNOSIS — Z885 Allergy status to narcotic agent status: Secondary | ICD-10-CM

## 2020-03-28 DIAGNOSIS — K921 Melena: Secondary | ICD-10-CM | POA: Diagnosis present

## 2020-03-28 DIAGNOSIS — I452 Bifascicular block: Secondary | ICD-10-CM | POA: Diagnosis present

## 2020-03-28 DIAGNOSIS — R4182 Altered mental status, unspecified: Secondary | ICD-10-CM

## 2020-03-28 DIAGNOSIS — I459 Conduction disorder, unspecified: Secondary | ICD-10-CM

## 2020-03-28 DIAGNOSIS — G4733 Obstructive sleep apnea (adult) (pediatric): Secondary | ICD-10-CM | POA: Diagnosis present

## 2020-03-28 DIAGNOSIS — I130 Hypertensive heart and chronic kidney disease with heart failure and stage 1 through stage 4 chronic kidney disease, or unspecified chronic kidney disease: Secondary | ICD-10-CM | POA: Diagnosis present

## 2020-03-28 DIAGNOSIS — Z950 Presence of cardiac pacemaker: Secondary | ICD-10-CM

## 2020-03-28 DIAGNOSIS — Z981 Arthrodesis status: Secondary | ICD-10-CM | POA: Diagnosis not present

## 2020-03-28 DIAGNOSIS — K219 Gastro-esophageal reflux disease without esophagitis: Secondary | ICD-10-CM | POA: Diagnosis present

## 2020-03-28 DIAGNOSIS — I340 Nonrheumatic mitral (valve) insufficiency: Secondary | ICD-10-CM | POA: Diagnosis not present

## 2020-03-28 DIAGNOSIS — N1831 Chronic kidney disease, stage 3a: Secondary | ICD-10-CM

## 2020-03-28 DIAGNOSIS — Z85118 Personal history of other malignant neoplasm of bronchus and lung: Secondary | ICD-10-CM

## 2020-03-28 DIAGNOSIS — E785 Hyperlipidemia, unspecified: Secondary | ICD-10-CM | POA: Diagnosis present

## 2020-03-28 DIAGNOSIS — Z9989 Dependence on other enabling machines and devices: Secondary | ICD-10-CM | POA: Diagnosis present

## 2020-03-28 DIAGNOSIS — E113591 Type 2 diabetes mellitus with proliferative diabetic retinopathy without macular edema, right eye: Secondary | ICD-10-CM | POA: Diagnosis present

## 2020-03-28 DIAGNOSIS — E669 Obesity, unspecified: Secondary | ICD-10-CM | POA: Diagnosis present

## 2020-03-28 HISTORY — DX: Atherosclerotic heart disease of native coronary artery without angina pectoris: I25.10

## 2020-03-28 HISTORY — DX: Interstitial pulmonary disease, unspecified: J84.9

## 2020-03-28 HISTORY — DX: Chronic kidney disease, stage 3 unspecified: N18.30

## 2020-03-28 HISTORY — DX: Atrioventricular block, second degree: I44.1

## 2020-03-28 HISTORY — DX: Nonrheumatic aortic (valve) stenosis: I35.0

## 2020-03-28 LAB — RESPIRATORY PANEL BY RT PCR (FLU A&B, COVID)
Influenza A by PCR: NEGATIVE
Influenza B by PCR: NEGATIVE
SARS Coronavirus 2 by RT PCR: NEGATIVE

## 2020-03-28 LAB — CBC WITH DIFFERENTIAL/PLATELET
Abs Immature Granulocytes: 0.02 10*3/uL (ref 0.00–0.07)
Basophils Absolute: 0 10*3/uL (ref 0.0–0.1)
Basophils Relative: 0 %
Eosinophils Absolute: 0.2 10*3/uL (ref 0.0–0.5)
Eosinophils Relative: 3 %
HCT: 42.2 % (ref 39.0–52.0)
Hemoglobin: 14 g/dL (ref 13.0–17.0)
Immature Granulocytes: 0 %
Lymphocytes Relative: 24 %
Lymphs Abs: 1.7 10*3/uL (ref 0.7–4.0)
MCH: 31 pg (ref 26.0–34.0)
MCHC: 33.2 g/dL (ref 30.0–36.0)
MCV: 93.6 fL (ref 80.0–100.0)
Monocytes Absolute: 0.8 10*3/uL (ref 0.1–1.0)
Monocytes Relative: 11 %
Neutro Abs: 4.3 10*3/uL (ref 1.7–7.7)
Neutrophils Relative %: 62 %
Platelets: 192 10*3/uL (ref 150–400)
RBC: 4.51 MIL/uL (ref 4.22–5.81)
RDW: 13.2 % (ref 11.5–15.5)
WBC: 7 10*3/uL (ref 4.0–10.5)
nRBC: 0 % (ref 0.0–0.2)

## 2020-03-28 LAB — BASIC METABOLIC PANEL
Anion gap: 7 (ref 5–15)
BUN: 28 mg/dL — ABNORMAL HIGH (ref 8–23)
CO2: 23 mmol/L (ref 22–32)
Calcium: 8.4 mg/dL — ABNORMAL LOW (ref 8.9–10.3)
Chloride: 107 mmol/L (ref 98–111)
Creatinine, Ser: 1.4 mg/dL — ABNORMAL HIGH (ref 0.61–1.24)
GFR calc Af Amer: 55 mL/min — ABNORMAL LOW (ref >60)
GFR calc non Af Amer: 48 mL/min — ABNORMAL LOW (ref >60)
Glucose, Bld: 223 mg/dL — ABNORMAL HIGH (ref 70–99)
Potassium: 4.9 mmol/L (ref 3.5–5.1)
Sodium: 137 mmol/L (ref 135–145)

## 2020-03-28 LAB — URINALYSIS, ROUTINE W REFLEX MICROSCOPIC
Bacteria, UA: NONE SEEN
Bilirubin Urine: NEGATIVE
Glucose, UA: NEGATIVE mg/dL
Hgb urine dipstick: NEGATIVE
Ketones, ur: NEGATIVE mg/dL
Leukocytes,Ua: NEGATIVE
Nitrite: NEGATIVE
Protein, ur: 100 mg/dL — AB
Specific Gravity, Urine: 1.009 (ref 1.005–1.030)
pH: 5 (ref 5.0–8.0)

## 2020-03-28 LAB — POC OCCULT BLOOD, ED: Fecal Occult Bld: POSITIVE — AB

## 2020-03-28 LAB — PROTIME-INR
INR: 1.1 (ref 0.8–1.2)
Prothrombin Time: 14.2 seconds (ref 11.4–15.2)

## 2020-03-28 LAB — TYPE AND SCREEN
ABO/RH(D): O POS
Antibody Screen: NEGATIVE

## 2020-03-28 LAB — HEMOGLOBIN A1C
Hgb A1c MFr Bld: 7.6 % — ABNORMAL HIGH (ref 4.8–5.6)
Mean Plasma Glucose: 171.42 mg/dL

## 2020-03-28 LAB — CBG MONITORING, ED: Glucose-Capillary: 193 mg/dL — ABNORMAL HIGH (ref 70–99)

## 2020-03-28 LAB — GLUCOSE, CAPILLARY: Glucose-Capillary: 141 mg/dL — ABNORMAL HIGH (ref 70–99)

## 2020-03-28 LAB — MAGNESIUM: Magnesium: 1.6 mg/dL — ABNORMAL LOW (ref 1.7–2.4)

## 2020-03-28 LAB — TSH: TSH: 3.258 u[IU]/mL (ref 0.350–4.500)

## 2020-03-28 MED ORDER — INSULIN DETEMIR 100 UNIT/ML ~~LOC~~ SOLN
22.0000 [IU] | Freq: Every day | SUBCUTANEOUS | Status: DC
  Administered 2020-03-28: 22 [IU] via SUBCUTANEOUS
  Filled 2020-03-28 (×2): qty 0.22

## 2020-03-28 MED ORDER — POTASSIUM CHLORIDE CRYS ER 20 MEQ PO TBCR: 20.0000 meq | EXTENDED_RELEASE_TABLET | Freq: Every day | ORAL | Status: DC

## 2020-03-28 MED ORDER — INSULIN ASPART 100 UNIT/ML ~~LOC~~ SOLN: 0.0000 [IU] | Freq: Three times a day (TID) | SUBCUTANEOUS | Status: DC

## 2020-03-28 MED ORDER — ASCORBIC ACID 500 MG PO TABS
1000.0000 mg | ORAL_TABLET | Freq: Every day | ORAL | Status: DC
  Administered 2020-03-29 – 2020-03-31 (×3): 1000 mg via ORAL
  Filled 2020-03-28 (×3): qty 2

## 2020-03-28 MED ORDER — HEPARIN SODIUM (PORCINE) 5000 UNIT/ML IJ SOLN
5000.0000 [IU] | Freq: Two times a day (BID) | INTRAMUSCULAR | Status: DC
  Administered 2020-03-28: 5000 [IU] via SUBCUTANEOUS
  Filled 2020-03-28: qty 1

## 2020-03-28 MED ORDER — ACETAMINOPHEN 650 MG RE SUPP: 650.0000 mg | Freq: Four times a day (QID) | RECTAL | Status: DC | PRN

## 2020-03-28 MED ORDER — LOSARTAN POTASSIUM 50 MG PO TABS
50.0000 mg | ORAL_TABLET | Freq: Every day | ORAL | Status: DC
  Administered 2020-03-29 – 2020-03-31 (×3): 50 mg via ORAL
  Filled 2020-03-28 (×3): qty 1

## 2020-03-28 MED ORDER — ENOXAPARIN SODIUM 40 MG/0.4ML ~~LOC~~ SOLN: 40.0000 mg | SUBCUTANEOUS | Status: DC

## 2020-03-28 MED ORDER — SODIUM CHLORIDE 0.9% FLUSH
3.0000 mL | Freq: Two times a day (BID) | INTRAVENOUS | Status: DC
  Administered 2020-03-30: 3 mL via INTRAVENOUS

## 2020-03-28 MED ORDER — PANTOPRAZOLE SODIUM 40 MG PO TBEC
40.0000 mg | DELAYED_RELEASE_TABLET | Freq: Every day | ORAL | Status: DC
  Administered 2020-03-29 – 2020-03-31 (×3): 40 mg via ORAL
  Filled 2020-03-28 (×3): qty 1

## 2020-03-28 MED ORDER — ASPIRIN EC 81 MG PO TBEC
81.0000 mg | DELAYED_RELEASE_TABLET | Freq: Every day | ORAL | Status: DC
  Administered 2020-03-29 – 2020-03-31 (×3): 81 mg via ORAL
  Filled 2020-03-28 (×3): qty 1

## 2020-03-28 MED ORDER — INSULIN ASPART 100 UNIT/ML ~~LOC~~ SOLN: 5.0000 [IU] | Freq: Three times a day (TID) | SUBCUTANEOUS | Status: DC

## 2020-03-28 MED ORDER — DIAZEPAM 5 MG PO TABS
5.0000 mg | ORAL_TABLET | Freq: Every evening | ORAL | Status: DC | PRN
  Administered 2020-03-28 – 2020-03-30 (×3): 5 mg via ORAL
  Filled 2020-03-28 (×3): qty 1

## 2020-03-28 MED ORDER — ONDANSETRON HCL 4 MG/2ML IJ SOLN
4.0000 mg | Freq: Once | INTRAMUSCULAR | Status: AC
  Administered 2020-03-28: 4 mg via INTRAVENOUS
  Filled 2020-03-28: qty 2

## 2020-03-28 MED ORDER — HYDRALAZINE HCL 25 MG PO TABS: 25.0000 mg | ORAL_TABLET | Freq: Four times a day (QID) | ORAL | Status: DC | PRN

## 2020-03-28 MED ORDER — LISINOPRIL 20 MG PO TABS: 20.0000 mg | ORAL_TABLET | Freq: Every day | ORAL | Status: DC

## 2020-03-28 MED ORDER — ACETAMINOPHEN 325 MG PO TABS: 650.0000 mg | ORAL_TABLET | Freq: Four times a day (QID) | ORAL | Status: DC | PRN

## 2020-03-28 MED ORDER — INSULIN ASPART 100 UNIT/ML ~~LOC~~ SOLN: 0.0000 [IU] | Freq: Every day | SUBCUTANEOUS | Status: DC

## 2020-03-28 MED ORDER — ADULT MULTIVITAMIN W/MINERALS CH
1.0000 | ORAL_TABLET | Freq: Every day | ORAL | Status: DC
  Administered 2020-03-29 – 2020-03-31 (×3): 1 via ORAL
  Filled 2020-03-28 (×3): qty 1

## 2020-03-28 MED ORDER — ALBUTEROL SULFATE (2.5 MG/3ML) 0.083% IN NEBU: 2.5000 mg | INHALATION_SOLUTION | RESPIRATORY_TRACT | Status: DC | PRN

## 2020-03-28 NOTE — Consult Note (Addendum)
Cardiology Consult    Patient ID: Shane [name] MRN: [id number], DOB/AGE: 12/26/1940   Admit date: 03/28/2020 Date of Consult: 03/28/2020  Primary Physician: Burnard Bunting, MD Primary Cardiologist: Kirk Ruths, MD Requesting Provider: Celesta Gentile, MD  Patient Profile    Shane [name] is a 79 y.o. male with a history of nonobs CAD, Mobitz I, HTN, HL, moderate aortic stenosis, CKD III, DMII, pulmonary fibrosis, OSA, and obesity, who is being seen today for the evaluation of syncope at the request of Dr. Roosevelt Locks.  Past Medical History   Past Medical History:  Diagnosis Date  . 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 Cancer ([place])    ling  . Cataract   . Chronic kidney disease    d/t DM  . CKD (chronic kidney disease), stage III   . Diabetes mellitus    Vgo disposal insulin bolus  simular to insulin pump  . Dyspnea   . GERD (gastroesophageal reflux disease)   . Hyperlipidemia   . Hypertension   . Idiopathic pulmonary fibrosis ([place]) 11/2016  . ILD (interstitial lung disease) ([place])   . Moderate aortic stenosis    a. 10/2019 Echo: EF 55-60%, Gr2 DD. Nl RV.   Marland Kitchen Neuromuscular disorder ([place])   . Nonobstructive CAD (coronary artery disease)    a. 2012 Cath: mod, nonobs dzs; b. 10/2016 MV: EF 60%, no ischemia.  Marland Kitchen PONV (postoperative nausea and vomiting)   . Sleep apnea     uses cpap asked to bring mask and tubing    Past Surgical History:  Procedure Laterality Date  . ANTERIOR FUSION CERVICAL SPINE  2012  . CARDIAC CATHETERIZATION  2011  . CARDIAC CATHETERIZATION N/A 11/09/2016   Procedure: Right Heart Cath;  Surgeon: Belva Crome, MD;  Location: [facility];  Service: Cardiovascular;  Laterality: N/A;  . carpel tunnel     left wrist  . CATARACT EXTRACTION    . CATARACT EXTRACTION W/ INTRAOCULAR LENS  IMPLANT, BILATERAL  2013  . CERVICAL LAMINECTOMY  2012  . COLONOSCOPY N/A 01/14/2013   Procedure: COLONOSCOPY;  Surgeon: Irene Shipper, MD;  Location: [facility];  Service: Endoscopy;   Laterality: N/A;  . EYE SURGERY    . [place]   left  . LUMBAR LAMINECTOMY  2003  . LUNG BIOPSY Left 12/26/2016   Procedure: LUNG BIOPSY;  Surgeon: [name], MD;  Location: [facility];  Service: Thoracic;  Laterality: Left;  . POSTERIOR FUSION CERVICAL SPINE  2012  . TRIGGER FINGER RELEASE  2011   4th finger left hand  . VIDEO ASSISTED THORACOSCOPY Left 12/26/2016   Procedure: VIDEO ASSISTED THORACOSCOPY;  Surgeon: [name], MD;  Location: [facility];  Service: Thoracic;  Laterality: Left;  Marland Kitchen VIDEO BRONCHOSCOPY N/A 12/26/2016   Procedure: VIDEO BRONCHOSCOPY;  Surgeon: [name], MD;  Location: [facility];  Service: Thoracic;  Laterality: N/A;     Allergies  Allergies  Allergen Reactions  . Codeine Hives and Itching  . Other Diarrhea and Other (See Comments)    Esbriet causes elevated LFTs. D/C on 06/14/17 and SEVERE DIARRHEA  . Ofev [Nintedanib] Diarrhea    SEVERE DIARRHEA    History of Present Illness    79 year old male with above complex past medical history including nonobstructive CAD, moderate aortic stenosis, Mobitz 1 heart block, hypertension, hyperlipidemia, stage III chronic kidney disease, type 2 diabetes mellitus, pulmonary fibrosis, sleep apnea, and obesity.  He was previous evaluated with diagnostic  catheterization 2012 showing moderate, nonobstructive CAD.  Subsequent stress testing in November 2017 was nonischemic.  He has interstitial lung disease and is followed closely by pulmonology.  He is currently enrolled in a clinical trial for this.  He underwent VATS in January 2018 and that was when he was first noted to have intermittent Mobitz 1 heart block.  Because of Mobitz 1 and bradycardia, he has not been able to be maintained on AV nodal blocking agents.  Patient has had multiple ER visits in the setting of sudden onset of lightheadedness and presyncope.  He has been told in the past that he needs to adequately hydrate.  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  He recognized this as the onset of one of his presyncopal spells.  He moved from his car onto his scooter and routed into his house.  As soon as he got into his house, he noted worsening of lightheadedness associated with nausea and diaphoresis.  He sat in his foyer and felt a little bit better.  He then had a sensation that he needed to have a bowel movement.  He walked up his stairs and again felt very lightheaded.  Upon arriving in the bathroom, he says his vision blackened and he fell backwards onto the toilet.  At that point, he was unaware of what was going on but could hear his wife.  He says he simply could not respond.  His wife says this went on for about 5 to 10 minutes.  She did check his blood pressure and he had systolics in the 52W.  She called EMS.  Upon their arrival, his ECG was notable for bradycardia with Mobitz 1 heart block with rates in the 30s to 50s.  Symptoms resolved in route to the hospital.  Here, he has been bradycardic with Mobitz 1 and rates dropping into the 30s.  He has not any recurrent symptoms.  Lab work thus far relatively unremarkable.  Inpatient Medications    . vitamin C  1,000 mg Oral Daily  . aspirin EC  81 mg Oral Daily  . heparin injection (subcutaneous)  5,000 Units Subcutaneous Q12H  . insulin aspart  0-5 Units Subcutaneous QHS  . insulin aspart  0-9 Units Subcutaneous TID WC  . insulin aspart  5 Units Subcutaneous TID WC  . insulin detemir  22 Units Subcutaneous QHS  . [START ON 03/29/2020] losartan  50 mg Oral Daily  . multivitamin with minerals  1 tablet Oral Daily  . pantoprazole  40 mg Oral Daily  . sodium chloride flush  3 mL Intravenous Q12H    Family History    Family History  Problem Relation Age of Onset  . Diabetes Mellitus II Mother   . Emphysema Father 20  . Colon cancer Neg Hx     . Esophageal cancer Neg Hx   . Rectal cancer Neg Hx   . Stomach cancer Neg Hx    He indicated that his mother is deceased. He indicated that his father is deceased. He indicated that his maternal grandmother is deceased. He indicated that his maternal grandfather is deceased. He indicated that his paternal grandmother is deceased. He indicated that his paternal grandfather is deceased. He indicated that the status of his neg hx is unknown.   Social History    Social History   Socioeconomic  History  . Marital status: Married    Spouse name: Not on file  . Number of children: Not on file  . Years of education: Not on file  . Highest education level: Not on file  Occupational History  . Not on file  Tobacco Use  . Smoking status: Never Smoker  . Smokeless tobacco: Never Used  Substance and Sexual Activity  . Alcohol use: No  . Drug use: No  . Sexual activity: Not on file  Other Topics Concern  . 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    Social Determinants of Health   Financial Resource Strain:   . Difficulty of Paying Living Expenses:   Food Insecurity:   . Worried About Charity fundraiser in the Last Year:   . Arboriculturist in the Last Year:   Transportation Needs:   . Film/video editor (Medical):   Marland Kitchen Lack of Transportation (Non-Medical):   Physical Activity:   . Days of Exercise per Week:   . Minutes of Exercise per Session:   Stress:   . Feeling of Stress :   Social Connections:   . Frequency of Communication with Friends and Family:   . Frequency of Social Gatherings with Friends and Family:   . Attends Religious Services:   . Active Member of Clubs or Organizations:   . Attends Archivist Meetings:   Marland Kitchen Marital Status:   Intimate Partner Violence:   . Fear of Current or Ex-Partner:   . Emotionally Abused:   Marland Kitchen Physically Abused:   . Sexually Abused:      Review of Systems    General:  No chills, fever,  night sweats or weight changes.  Cardiovascular:  No chest pain, +++ chronic dyspnea on exertion, +++ LE edema for which he wears compression socks, no orthopnea, palpitations, paroxysmal nocturnal dyspnea.  +++ presyncope/syncope. Dermatological: No rash, lesions/masses Respiratory: No cough, +++ dyspnea Urologic: No hematuria, dysuria Abdominal:   +++ nausea in setting of presyncope today, no vomiting, diarrhea, bright red blood per rectum, melena, or hematemesis Neurologic:  +++ visual changes, +++ wkns, no changes in mental status. All other systems reviewed and are otherwise negative except as noted above.  Physical Exam    Blood pressure 117/66, pulse (!) 52, temperature (!) 97.4 F (36.3 C), temperature source Oral, resp. rate 18, SpO2 97 %.  General: Pleasant, NAD Psych: Normal affect. Neuro: Alert and oriented X 3. Moves all extremities spontaneously. HEENT: Normal  Neck: Supple, obese, difficult to gauge jvp.  No bruits. Lungs:  Resp regular and unlabored, bilat crackles 1/2 way up. Heart: RRR no s3, s4, 2/6 SEM along the left sternal border. Abdomen: Obese, Soft, non-tender, non-distended, BS + x 4.  Extremities: No clubbing, cyanosis.  1+ bilat LE edema to the knees. DP/PT/Radials 2+ and equal bilaterally.  Labs    Cardiac Enzymes pending    Lab Results  Component Value Date   WBC 7.0 03/28/2020   HGB 14.0 03/28/2020   HCT 42.2 03/28/2020   MCV 93.6 03/28/2020   PLT 192 03/28/2020    Recent Labs  Lab 03/28/20 1221  NA 137  K 4.9  CL 107  CO2 23  BUN 28*  CREATININE 1.40*  CALCIUM 8.4*  GLUCOSE 223*     Radiology Studies    CT Head Wo Contrast  Result Date: 03/28/2020 CLINICAL DATA:  Bloody diarrhea, syncope, encephalopathy EXAM: CT HEAD WITHOUT CONTRAST  TECHNIQUE: Contiguous axial images were obtained from the base of the skull through the vertex without intravenous contrast. COMPARISON:  Prior CT head 08/17/2019 FINDINGS: Brain: No evidence of acute  infarction, hemorrhage, hydrocephalus, extra-axial collection or mass lesion/mass effect. Mild cerebral cortical atrophy. Vascular: No hyperdense vessel or unexpected calcification. Skull: Normal. Negative for fracture or focal lesion. Sinuses/Orbits: Minimal mucoperiosteal thickening. No active sinusitis. Orbits are unremarkable. Other: None. IMPRESSION: 1. No acute intracranial abnormality. Electronically Signed   By: Jacqulynn Cadet M.D.   On: 03/28/2020 13:51   DG Chest Port 1 View  Result Date: 03/28/2020 CLINICAL DATA:  Syncope.  Shortness of breath. EXAM: PORTABLE CHEST 1 VIEW COMPARISON:  February 25, 2018 FINDINGS: The cardiomediastinal silhouette is stable. The lungs are unchanged since May of 2019. Opacities are consistent with known pulmonary fibrosis. No new infiltrate. No interval changes. IMPRESSION: Known pulmonary fibrosis.  No acute interval changes. Electronically Signed   By: Dorise Bullion III M.D   On: 03/28/2020 13:20   OCT, Retina - OU - Both Eyes  Result Date: 03/16/2020 Right Eye Quality was good. Scan locations included subfoveal. Progression has been stable. Findings include outer retinal atrophy, central retinal atrophy, macular pucker, retinal drusen . Left Eye Quality was good. Scan locations included subfoveal. Progression has been stable. Findings include outer retinal atrophy, central retinal atrophy, retinal drusen . Notes NO ACTIVE CSME,,  OU,,,    epiretinal membrane OD is nasal to the fovea and not visually significant  Color Fundus Photography Optos - OU - Both Eyes  Result Date: 03/16/2020 Right Eye Progression has been stable. Disc findings include normal observations. Macula : normal observations. Periphery : normal observations. Left Eye Disc findings include normal observations. Macula : normal observations, flat. Periphery : normal observations. Notes OD with good PRP 360 degrees some room nasally. No active CS ME OS with good PRP 360, with clear media.  PDR  quiet OU   ECG & Cardiac Imaging    Sinus bradycardia, 54, left axis deviation, left anterior fascicular block, right bundle branch block - personally reviewed. Telemetry notable for Mobitz 1 heart block with rates in occasionally in the 30s  Assessment & Plan    1.  Presyncope/syncope; Bifascicular block/Mobitz 1: Patient reports prior history of presyncope and syncope with multiple ER visits over the years.  Events typically preceded by sudden onset of lightheadedness with nausea and diaphoresis.  He has a known history of Mobitz 1 heart block and bradycardia but has been stable times several years.  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  Symptoms were accompanied by the urge to use the bathroom, and he lost consciousness just prior to sitting on the toilet, falling backward onto it.  His wife noted a bp in the 44's.  EMS found him to be bradycardic w/ rates in the 50 but w/ Mobitz 1 and occas HR drops into the 30's.  He is currently symptomatic.  Labwork and Head CT unremarkable.  Agree w/ admit to tele to watch for higher grade heart block.  With bifascicular block, mobitz 1, and what appears to be symptomatic bradycardia, he will likely require pacing on Monday.  Cont to avoid AVN blocking agents.  2.  Moderate aortic stenosis: Follow-up echo in the setting of presyncope/syncope.  3.  Essential hypertension: Stable.  Hold off on home dose of losartan and furosemide at this time given hypotension on EMS evaluation.  4.  Stage III  chronic kidney disease: Stable.  5.  Type 2 diabetes mellitus: Insulin management per internal medicine.  6.  Interstitial Pulmonary Fibrosis:  He notes stable DOE @ home.  Does not require home O2.  Follow.  Signed, Murray Hodgkins, NP 03/28/2020, 5:51 PM   I have seen and examined the patient along with Murray Hodgkins, NP.  I have reviewed the chart, notes and new data.  I agree with PA/NP's  note.  Key new complaints: Patient had symptoms of near syncope with minimal activity, worsened after using the bathroom.  Currently not on any medications that could worsen chronotropic function.  Beta-blocker stopped several years ago due to second-degree AV block. He has had complaints of exertional fatigue and dyspnea for a long time.  He has been evaluated in both the pulmonary and cardiology clinic for this. Key examination changes: Slow irregular rhythm, Mobitz type I second-degree AV block.  Grade 2/6 systolic murmur heard from the aortic focus to the left lower sternal border, no diastolic murmurs.  No edema, no jugular venous distention, clear lungs Key new findings / data: Bradycardia due to sinus rhythm with second-degree AV block Mobitz type I, periods of 2: 1 AV block associated with heart rates around 30. Echo roughly 6 months ago showed moderate aortic stenosis (mean gradient 17 mmHg, dimensionless index 0.3), normal left ventricular systolic function, no evidence of LVH  PLAN: I think he has symptomatic bradycardia due to second-degree AV block.  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   It is also possible that his exertional dyspnea is related to chronotropic incompetence.  I think he will benefit from implantation of a dual-chamber permanent pacemaker.  We discussed the pros and cons and potential complications of this procedure in some detail today.  We will ask EP to evaluate tomorrow. For completeness, we should reevaluate the severity of his aortic stenosis, although his syncopal event did not occur during physical exertion.  Sanda Klein, MD, [place] [phone] 03/28/2020, 6:25 PM   For questions or updates, please contact   Please consult [email or web address] for contact info under Cardiology/STEMI.

## 2020-03-28 NOTE — ED Triage Notes (Signed)
Pt here from home for syncopal episode. EMS was called for SOB, upon arrival pt was on the toilet, had an episode of diarrhea w/ blood in it, and lost consciousness. Initial BP 72/40, pt only responsive to pain. EMS gave 4mg  zofan and 800 ml normal saline, pt now aox1, follows commands.

## 2020-03-28 NOTE — H&P (Signed)
History and Physical    Shane [name] DOB: 03-09-1941 DOA: 03/28/2020  PCP: Burnard Bunting, MD   Patient coming from: Home  I have personally briefly reviewed patient's old medical records in [place]  Chief Complaint: Dizzy  HPI: Shane [name] is a 79 y.o. male with medical history significant of sick sinus syndrome, second-degree AV block, moderate aortic stenosis, IDDM, hypertension with labile blood pressure, chronic diastolic CHF, idiopathic pulmonary fibrosis, CKD stage II, RBBB, presented with near syncope.  Patient had a similar episode 2 years ago, and has been following with his cardiologist.  According to the patient, he was on beta-blocker before which was discontinued after finding of episode of bradycardia.  This morning, while sitting on the toilet, of sudden, patient developed male palpitation and lightheadedness, he and his wife denied he ever lost consciousness, patient called his wife, wife found patient was unresponsive, not answering any questions but remains his conscious.  Patient described as he was able to understand everything his wife was saying but he could not talk.  Wife called 18, EMS arrived and found patient bradycardia with heart rate in the 16S and systolic blood pressures 06T.  Was given 800 mL of saline and 1 dose of Zofran, symptoms improved.  Patient also reported he passed stools with drips of blood this morning during the episode.  But denied any abdominal pain, no chest pain or short of breath. ED Course: Blood pressure much improved, potassium is 4.9, creatinine 1.4 around his baseline, EKG showed sinus bradycardia with prolonged PR interval.  Review of Systems: As per HPI otherwise 10 point review of systems negative.    Past Medical History:  Diagnosis Date  . Cancer ([place])    ling  . Cataract   . Chest pain   . Chronic kidney disease    d/t DM  . Diabetes mellitus    Vgo disposal insulin bolus  simular to insulin pump   . Dyspnea   . GERD (gastroesophageal reflux disease)   . Hyperlipidemia   . Hypertension   . Idiopathic pulmonary fibrosis ([place]) 11/2016  . Neuromuscular disorder ([place])   . PONV (postoperative nausea and vomiting)   . Sleep apnea     uses cpap asked to bring mask and tubing    Past Surgical History:  Procedure Laterality Date  . ANTERIOR FUSION CERVICAL SPINE  2012  . CARDIAC CATHETERIZATION  2011  . CARDIAC CATHETERIZATION N/A 11/09/2016   Procedure: Right Heart Cath;  Surgeon: Belva Crome, MD;  Location: [facility];  Service: Cardiovascular;  Laterality: N/A;  . carpel tunnel     left wrist  . CATARACT EXTRACTION    . CATARACT EXTRACTION W/ INTRAOCULAR LENS  IMPLANT, BILATERAL  2013  . CERVICAL LAMINECTOMY  2012  . COLONOSCOPY N/A 01/14/2013   Procedure: COLONOSCOPY;  Surgeon: Irene Shipper, MD;  Location: [facility];  Service: Endoscopy;  Laterality: N/A;  . EYE SURGERY    . [place]   left  . LUMBAR LAMINECTOMY  2003  . LUNG BIOPSY Left 12/26/2016   Procedure: LUNG BIOPSY;  Surgeon: [name], MD;  Location: [facility];  Service: Thoracic;  Laterality: Left;  . POSTERIOR FUSION CERVICAL SPINE  2012  . TRIGGER FINGER RELEASE  2011   4th finger left hand  . VIDEO ASSISTED THORACOSCOPY Left 12/26/2016   Procedure: VIDEO ASSISTED THORACOSCOPY;  Surgeon: [name], MD;  Location: [facility];  Service: Thoracic;  Laterality: Left;  Marland Kitchen VIDEO BRONCHOSCOPY N/A 12/26/2016   Procedure: VIDEO BRONCHOSCOPY;  Surgeon: [name], MD;  Location: [facility];  Service: Thoracic;  Laterality: N/A;     reports that he has never smoked. He has never used smokeless tobacco. He reports that he does not drink alcohol or use drugs.  Allergies  Allergen Reactions  . Codeine Hives and Itching  . Other Diarrhea and Other (See Comments)    Esbriet causes elevated LFTs. D/C on 06/14/17 and SEVERE DIARRHEA  . Ofev [Nintedanib] Diarrhea    SEVERE DIARRHEA     Family History  Problem Relation Age of Onset  . Diabetes Mellitus II Mother   . Emphysema Father 28  . Colon cancer Neg Hx   . Esophageal cancer Neg Hx   . Rectal cancer Neg Hx   . Stomach cancer Neg Hx     Prior to Admission medications   Medication Sig Start Date End Date Taking? Authorizing Provider  Ascorbic Acid (VITAMIN C) 1000 MG tablet Take 1,000 mg by mouth daily.    [provider]  aspirin 81 MG tablet Take 81 mg by mouth daily.      [provider]  clotrimazole-betamethasone (LOTRISONE) cream Apply 1 application topically daily as needed (for skin infection).  01/16/17   [provider]  Continuous Blood Gluc Sensor (FREESTYLE LIBRE 14 DAY SENSOR) MISC 1 patch every 14 (fourteen) days.    [provider]  diazepam (VALIUM) 5 MG tablet Take 5 mg by mouth at bedtime as needed (for sleep).     [provider]  EPINEPHrine 0.3 mg/0.3 mL IJ SOAJ injection Inject 0.3 mg into the muscle once as needed (for anaphylactic reaction).  01/30/18   [provider]  furosemide (LASIX) 40 MG tablet Take 40 mg by mouth daily. 02/15/18   [provider]  insulin detemir (LEVEMIR) 100 UNIT/ML injection Inject 22 Units into the skin at bedtime.    [provider]  insulin lispro (HUMALOG) 100 UNIT/ML injection Inject 40 Units into the skin daily. Use with V-Go 40     [provider]  KLOR-CON M20 20 MEQ tablet Take 20 mEq by mouth daily.  09/26/16   [provider]  lisinopril (PRINIVIL,ZESTRIL) 20 MG tablet Take 20 mg by mouth daily.  09/08/16   [provider]  losartan (COZAAR) 50 MG tablet Take 50 mg by mouth daily.    [provider]  Multiple Vitamin (MULTIVITAMIN) tablet Take 1 tablet by mouth daily.      [provider]  pantoprazole (PROTONIX) 40 MG tablet Take 40 mg by mouth daily.      [provider]  PROAIR HFA 108 [phone] Base) MCG/ACT inhaler Inhale 2 puffs  into the lungs every 4 (four) hours as needed for wheezing or shortness of breath.  10/03/16   [provider]    Physical Exam: Vitals:   03/28/20 1345 03/28/20 1400 03/28/20 1415 03/28/20 1430  BP: (!) 137/49 (!) 147/65 (!) 158/65 140/61  Pulse: (!) 51 61 (!) 58 60  Resp: (!) 0 18 17 15   Temp:      TempSrc:      SpO2: 100% 100% 99% 100%    Constitutional: NAD, calm, comfortable Vitals:   03/28/20 1345 03/28/20 1400 03/28/20 1415 03/28/20 1430  BP: (!) 137/49 (!) 147/65 (!) 158/65 140/61  Pulse: (!) 51 61 (!) 58 60  Resp: (!) 0 18 17 15   Temp:  TempSrc:      SpO2: 100% 100% 99% 100%   Eyes: PERRL, lids and conjunctivae normal ENMT: Mucous membranes are moist. Posterior pharynx clear of any exudate or lesions.Normal dentition.  Neck: normal, supple, no masses, no thyromegaly Respiratory: clear to auscultation bilaterally, no wheezing, no crackles. Normal respiratory effort. No accessory muscle use.  Cardiovascular: Regular rate and rhythm, bradycardia with systolic murmur heard at base. No extremity edema. 2+ pedal pulses. No carotid bruits.  Abdomen: no tenderness, no masses palpated. No hepatosplenomegaly. Bowel sounds positive.  Musculoskeletal: no clubbing / cyanosis. No joint deformity upper and lower extremities. Good ROM, no contractures. Normal muscle tone.  Skin: no rashes, lesions, ulcers. No induration Neurologic: CN 2-12 grossly intact. Sensation intact, DTR normal. Strength 5/5 in all 4.  Psychiatric: Normal judgment and insight. Alert and oriented x 3. Normal mood.     Labs on Admission: I have personally reviewed following labs and imaging studies  CBC: Recent Labs  Lab 03/28/20 1221  WBC 7.0  NEUTROABS 4.3  HGB 14.0  HCT 42.2  MCV 93.6  PLT 147   Basic Metabolic Panel: Recent Labs  Lab 03/28/20 1221  NA 137  K 4.9  CL 107  CO2 23  GLUCOSE 223*  BUN 28*  CREATININE 1.40*  CALCIUM 8.4*   GFR: CrCl cannot be calculated (Unknown  ideal weight.). Liver Function Tests: No results for input(s): AST, ALT, ALKPHOS, BILITOT, PROT, ALBUMIN in the last 168 hours. No results for input(s): LIPASE, AMYLASE in the last 168 hours. No results for input(s): AMMONIA in the last 168 hours. Coagulation Profile: Recent Labs  Lab 03/28/20 1221  INR 1.1   Cardiac Enzymes: No results for input(s): CKTOTAL, CKMB, CKMBINDEX, TROPONINI in the last 168 hours. BNP (last 3 results) No results for input(s): PROBNP in the last 8760 hours. HbA1C: No results for input(s): HGBA1C in the last 72 hours. CBG: Recent Labs  Lab 03/28/20 1246  GLUCAP 193*   Lipid Profile: No results for input(s): CHOL, HDL, LDLCALC, TRIG, CHOLHDL, LDLDIRECT in the last 72 hours. Thyroid Function Tests: No results for input(s): TSH, T4TOTAL, FREET4, T3FREE, THYROIDAB in the last 72 hours. Anemia Panel: No results for input(s): VITAMINB12, FOLATE, FERRITIN, TIBC, IRON, RETICCTPCT in the last 72 hours. Urine analysis:    Component Value Date/Time   COLORURINE YELLOW 03/28/2020 1345   APPEARANCEUR CLEAR 03/28/2020 1345   LABSPEC 1.009 03/28/2020 1345   PHURINE 5.0 03/28/2020 1345   GLUCOSEU NEGATIVE 03/28/2020 1345   HGBUR NEGATIVE 03/28/2020 1345   BILIRUBINUR NEGATIVE 03/28/2020 1345   KETONESUR NEGATIVE 03/28/2020 1345   PROTEINUR 100 (A) 03/28/2020 1345   UROBILINOGEN 1.0 09/27/2011 1112   NITRITE NEGATIVE 03/28/2020 1345   LEUKOCYTESUR NEGATIVE 03/28/2020 1345    Radiological Exams on Admission: CT Head Wo Contrast  Result Date: 03/28/2020 CLINICAL DATA:  Bloody diarrhea, syncope, encephalopathy EXAM: CT HEAD WITHOUT CONTRAST TECHNIQUE: Contiguous axial images were obtained from the base of the skull through the vertex without intravenous contrast. COMPARISON:  Prior CT head 08/17/2019 FINDINGS: Brain: No evidence of acute infarction, hemorrhage, hydrocephalus, extra-axial collection or mass lesion/mass effect. Mild cerebral cortical atrophy.  Vascular: No hyperdense vessel or unexpected calcification. Skull: Normal. Negative for fracture or focal lesion. Sinuses/Orbits: Minimal mucoperiosteal thickening. No active sinusitis. Orbits are unremarkable. Other: None. IMPRESSION: 1. No acute intracranial abnormality. Electronically Signed   By: Jacqulynn Cadet M.D.   On: 03/28/2020 13:51   DG Chest Port 1 View  Result Date: 03/28/2020 CLINICAL  DATA:  Syncope.  Shortness of breath. EXAM: PORTABLE CHEST 1 VIEW COMPARISON:  February 25, 2018 FINDINGS: The cardiomediastinal silhouette is stable. The lungs are unchanged since May of 2019. Opacities are consistent with known pulmonary fibrosis. No new infiltrate. No interval changes. IMPRESSION: Known pulmonary fibrosis.  No acute interval changes. Electronically Signed   By: Dorise Bullion III M.D   On: 03/28/2020 13:20    EKG: Independently reviewed.  Repeated EKG showing second-degree AV block type I versus type II  Assessment/Plan Active Problems:   Heart block  Near syncope -Suspect secondary degree AV block on top of sick sinus syndrome -Discussed with on-call cardiologist, who is coming to see the patient this afternoon emergently -Will admit patient to PCU for close monitoring, blood pressure maintained, no more episodes of lightheadedness, will hold off temporary pacemaker -Atropine as needed -Potassium 4.9, will check magnesium -TSH  Hematochezia H&H stable, monitor CBC in the morning Patient takes PPI If H&H stable, consider outpatient GI work-up for colonoscopy  History of aortic stenosis moderate This was shown on last years echocardiogram, will repeat echo today Hold Lasix for now, given the patient had hypotension this morning, currently patient looks euvolemic  IDDM Continue Lantus, add a standing dose of 5 units 3 times daily AC plus sliding scale  CKD stage II Stable  SSS As above, discussed with patient and cardiology regarding possible pacemaker  evaluation  Hypertension with labile BP Hold Lasix, continue ARB, add PRN hydralazine  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

## 2020-03-28 NOTE — ED Provider Notes (Signed)
[facility] EMERGENCY DEPARTMENT Provider Note   CSN: [id number] Arrival date & time: 03/28/20  1214     History Chief Complaint  Patient presents with  . Loss of Consciousness  . Altered Mental Status    Shane [name] is a 79 y.o. male.  He has a history of lung cancer.  He is brought in by EMS after having an unresponsive episode going to the bathroom.  Initial blood pressure was in the 70s and he was only responsive to painful stimuli.  Level 5 caveat secondary to altered mental status. EMS says had a bowel movement with possible some blood in it. Patient improved somewhat getting IV fluids.  No reported seizure activity or incontinence.  Patient states he is passed out 6 times in the past, none recent  The history is provided by the patient and the EMS personnel. The history is limited by the condition of the patient.  Loss of Consciousness Episode history:  Single Most recent episode:  Today Progression:  Improving Chronicity:  Recurrent Context: bowel movement   Relieved by:  None tried Worsened by:  Nothing Ineffective treatments:  Lying down Associated symptoms: confusion   Associated symptoms: no chest pain, no difficulty breathing, no fever, no focal weakness, no headaches, no nausea, no recent fall, no shortness of breath and no vomiting   Risk factors: coronary artery disease   Altered Mental Status Presenting symptoms: confusion and partial responsiveness   Severity:  Severe Most recent episode:  Today Episode history:  Single Progression:  Improving Chronicity:  New Context: taking medications as prescribed and not recent change in medication   Associated symptoms: no abdominal pain, no bladder incontinence, no difficulty breathing, no fever, no headaches, no nausea, no rash and no vomiting        Past Medical History:  Diagnosis Date  . Cancer ([place])    ling  . Cataract   . Chest pain   . Chronic kidney disease    d/t DM  . Diabetes  mellitus    Vgo disposal insulin bolus  simular to insulin pump  . Dyspnea   . GERD (gastroesophageal reflux disease)   . Hyperlipidemia   . Hypertension   . 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 Neuromuscular disorder ([place])   . PONV (postoperative nausea and vomiting)   . Sleep apnea     uses cpap asked to bring mask and tubing    Patient Active Problem List   Diagnosis Date Noted  . 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) 03/16/2020  . 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 ([place]) 03/16/2020  . Right epiretinal membrane 03/16/2020  . Posterior vitreous detachment of right eye 03/16/2020  . Aortic stenosis, moderate 03/12/2020  . 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 Syncope 04/27/2018  . Hypoglycemia due to insulin 01/06/2018  . Essential hypertension 01/06/2018  . Diabetes mellitus type 2, with complication, on long term insulin pump ([place]) 01/06/2018  . Syncope and collapse 01/05/2018  . Encounter for therapeutic drug monitoring 06/29/2017  . Obstructive sleep apnea 05/05/2017  . 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[place]) 01/05/2017  . Abnormal chest x-ray 10/12/2016  . 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 ANTERIOR FUSION CERVICAL SPINE  2012  . CARDIAC CATHETERIZATION  2011  . CARDIAC CATHETERIZATION N/A 11/09/2016   Procedure:  Right Heart Cath;  Surgeon: Belva Crome, MD;  Location: [facility];  Service: Cardiovascular;  Laterality: N/A;  . carpel tunnel     left wrist  . CATARACT EXTRACTION    . CATARACT EXTRACTION W/ INTRAOCULAR LENS  IMPLANT, BILATERAL  2013  . CERVICAL LAMINECTOMY  2012  . COLONOSCOPY N/A 01/14/2013   Procedure: COLONOSCOPY;  Surgeon: Irene Shipper, MD;  Location: [facility];  Service: Endoscopy;  Laterality: N/A;  . EYE SURGERY    .  [place]   left  . LUMBAR LAMINECTOMY  2003  . LUNG BIOPSY Left 12/26/2016   Procedure: LUNG BIOPSY;  Surgeon: [name], MD;  Location: [facility];  Service: Thoracic;  Laterality: Left;  . POSTERIOR FUSION CERVICAL SPINE  2012  . TRIGGER FINGER RELEASE  2011   4th finger left hand  . VIDEO ASSISTED THORACOSCOPY Left 12/26/2016   Procedure: VIDEO ASSISTED THORACOSCOPY;  Surgeon: [name], MD;  Location: [facility];  Service: Thoracic;  Laterality: Left;  Mar[facility] Kitchen VIDEO BRONCHOSCOPY N/A 12/26/2016   Procedure: VIDEO BRONCHOSCOPY;  Surgeon: [name], MD;  Location: [facility] OR;  Service: Thoracic;  Laterality: N/A;       Family History  Problem Relation Age of Onset  . Diabetes Mellitus II Mother   . Emphysema Father 29  . Colon cancer Neg Hx   . Esophageal cancer Neg Hx   . Rectal cancer Neg Hx   . Stomach cancer Neg Hx     Social History   Tobacco Use  . Smoking status: Never Smoker  . Smokeless tobacco: Never Used  Substance Use Topics  . Alcohol use: No  . Drug use: No    Home Medications Prior to Admission medications   Medication Sig Start Date End Date Taking? Authorizing Provider  Ascorbic Acid (VITAMIN C) 1000 MG tablet Take 1,000 mg by mouth daily.    [provider]  aspirin 81 MG tablet Take 81 mg by mouth daily.      [provider]  clotrimazole-betamethasone (LOTRISONE) cream Apply 1 application topically daily as needed (for skin infection).  01/16/17   [provider]  Continuous Blood Gluc Sensor (FREESTYLE LIBRE 14 DAY SENSOR) MISC 1 patch every 14 (fourteen) days.    [provider]  diazepam (VALIUM) 5 MG tablet Take 5 mg by mouth at bedtime as needed (for sleep).     [provider]  EPINEPHrine 0.3 mg/0.3 mL IJ SOAJ injection Inject 0.3 mg into the muscle once as needed (for anaphylactic reaction).  01/30/18   [provider]  furosemide (LASIX) 40 MG tablet Take 40 mg by mouth  daily. 02/15/18   [provider]  insulin detemir (LEVEMIR) 100 UNIT/ML injection Inject 22 Units into the skin at bedtime.    [provider]  insulin lispro (HUMALOG) 100 UNIT/ML injection Inject 40 Units into the skin daily. Use with V-Go 40     [provider]  KLOR-CON M20 20 MEQ tablet Take 20 mEq by mouth daily.  09/26/16   [provider]  lisinopril (PRINIVIL,ZESTRIL) 20 MG tablet Take 20 mg by mouth daily.  09/08/16   [provider]  losartan (COZAAR) 50 MG tablet Take 50 mg by mouth daily.    [provider]  Multiple Vitamin (MULTIVITAMIN) tablet Take 1 tablet by mouth daily.      [provider]  pantoprazole (PROTONIX) 40 MG tablet Take 40 mg by mouth  daily.      [provider]  PROAIR HFA 108 [id number] Base) MCG/ACT inhaler Inhale 2 puffs into the lungs every 4 (four) hours as needed for wheezing or shortness of breath.  10/03/16   [provider]    Allergies    Codeine, Other, and Ofev [nintedanib]  Review of Systems   Review of Systems  Constitutional: Negative for fever.  HENT: Negative for sore throat.   Eyes: Negative for visual disturbance.  Respiratory: Negative for shortness of breath.   Cardiovascular: Positive for syncope. Negative for chest pain.  Gastrointestinal: Negative for abdominal pain, nausea and vomiting.  Genitourinary: Negative for bladder incontinence and dysuria.  Musculoskeletal: Negative for neck pain.  Skin: Negative for rash.  Neurological: Positive for syncope. Negative for focal weakness and headaches.  Psychiatric/Behavioral: Positive for confusion.    Physical Exam Updated Vital Signs BP 122/61   Pulse 62   Temp (!) 97.4 F (36.3 C) (Oral)   Resp (!) 25   SpO2 100%   Physical Exam Vitals and nursing note reviewed.  Constitutional:      General: He is awake.     Appearance: Normal appearance. He is well-developed.  HENT:     Head: Normocephalic and  atraumatic.  Eyes:     Conjunctiva/sclera: Conjunctivae normal.  Cardiovascular:     Rate and Rhythm: Normal rate and regular rhythm.     Heart sounds: No murmur.  Pulmonary:     Effort: Pulmonary effort is normal. No respiratory distress.     Breath sounds: Normal breath sounds.  Abdominal:     Palpations: Abdomen is soft.     Tenderness: There is no abdominal tenderness.  Musculoskeletal:        General: No deformity or signs of injury. Normal range of motion.     Cervical back: Neck supple.  Skin:    General: Skin is warm and dry.     Capillary Refill: Capillary refill takes less than 2 seconds.  Neurological:     General: No focal deficit present.     Mental Status: He is oriented to person, place, and time.     Motor: No weakness.     Comments: Patient is awake.  He is verbal with no slurred speech.  He is soft-spoken.  He knows he is at [facility].  He is able to give some basic history although speaks fairly slowly.  Moving all extremities nonfocal he     ED Results / Procedures / Treatments   Labs (all labs ordered are listed, but only abnormal results are displayed) Labs Reviewed  BASIC METABOLIC PANEL - Abnormal; Notable for the following components:      Result Value   Glucose, Bld 223 (*)    BUN 28 (*)    Creatinine, Ser 1.40 (*)    Calcium 8.4 (*)    GFR calc non Af Amer 48 (*)    GFR calc Af Amer 55 (*)    All other components within normal limits  URINALYSIS, ROUTINE W REFLEX MICROSCOPIC - Abnormal; Notable for the following components:   Protein, ur 100 (*)    All other components within normal limits  HEMOGLOBIN A1C - Abnormal; Notable for the following components:   Hgb A1c MFr Bld 7.6 (*)    All other components within normal limits  MAGNESIUM - Abnormal; Notable for the following components:   Magnesium 1.6 (*)    All other components within normal limits  GLUCOSE, CAPILLARY - Abnormal;  Notable for the following components:   Glucose-Capillary  141 (*)    All other components within normal limits  CBG MONITORING, ED - Abnormal; Notable for the following components:   Glucose-Capillary 193 (*)    All other components within normal limits  POC OCCULT BLOOD, ED - Abnormal; Notable for the following components:   Fecal Occult Bld POSITIVE (*)    All other components within normal limits  RESPIRATORY PANEL BY RT PCR (FLU A&B, COVID)  CBC WITH DIFFERENTIAL/PLATELET  PROTIME-INR  TSH  BASIC METABOLIC PANEL  CBC  PROTIME-INR  MAGNESIUM  TYPE AND SCREEN    EKG EKG Interpretation  Date/Time:  Saturday March 28 2020 12:23:31 EDT Ventricular Rate:  54 PR Interval:    QRS Duration: 175 QT Interval:  493 QTC Calculation: 468 R Axis:   -46 Text Interpretation: Sinus rhythm Prolonged PR interval RBBB and LAFB No significant change since prior 9/20 Confirmed by Aletta Edouard [phone]) on 03/28/2020 12:30:33 PM   Radiology CT Head Wo Contrast  Result Date: 03/28/2020 CLINICAL DATA:  Bloody diarrhea, syncope, encephalopathy EXAM: CT HEAD WITHOUT CONTRAST TECHNIQUE: Contiguous axial images were obtained from the base of the skull through the vertex without intravenous contrast. COMPARISON:  Prior CT head 08/17/2019 FINDINGS: Brain: No evidence of acute infarction, hemorrhage, hydrocephalus, extra-axial collection or mass lesion/mass effect. Mild cerebral cortical atrophy. Vascular: No hyperdense vessel or unexpected calcification. Skull: Normal. Negative for fracture or focal lesion. Sinuses/Orbits: Minimal mucoperiosteal thickening. No active sinusitis. Orbits are unremarkable. Other: None. IMPRESSION: 1. No acute intracranial abnormality. Electronically Signed   By: Jacqulynn Cadet M.D.   On: 03/28/2020 13:51   DG Chest Port 1 View  Result Date: 03/28/2020 CLINICAL DATA:  Syncope.  Shortness of breath. EXAM: PORTABLE CHEST 1 VIEW COMPARISON:  February 25, 2018 FINDINGS: The cardiomediastinal silhouette is stable. The lungs are unchanged  since May of 2019. Opacities are consistent with known pulmonary fibrosis. No new infiltrate. No interval changes. IMPRESSION: Known pulmonary fibrosis.  No acute interval changes. Electronically Signed   By: Dorise Bullion III M.D   On: 03/28/2020 13:20    Procedures Procedures (including critical care time)  Medications Ordered in ED Medications  aspirin EC tablet 81 mg (81 mg Oral Not Given 03/28/20 1859)  losartan (COZAAR) tablet 50 mg (has no administration in time range)  diazepam (VALIUM) tablet 5 mg (has no administration in time range)  insulin detemir (LEVEMIR) injection 22 Units (has no administration in time range)  pantoprazole (PROTONIX) EC tablet 40 mg (40 mg Oral Not Given 03/28/20 1852)  ascorbic acid (VITAMIN C) tablet 1,000 mg (1,000 mg Oral Not Given 03/28/20 1857)  multivitamin with minerals tablet 1 tablet (1 tablet Oral Not Given 03/28/20 1853)  albuterol (PROVENTIL) (2.5 MG/3ML) 0.083% nebulizer solution 2.5 mg (has no administration in time range)  insulin aspart (novoLOG) injection 0-9 Units (has no administration in time range)  insulin aspart (novoLOG) injection 0-5 Units (has no administration in time range)  sodium chloride flush (NS) 0.9 % injection 3 mL (3 mLs Intravenous Not Given 03/28/20 1515)  acetaminophen (TYLENOL) tablet 650 mg (has no administration in time range)    Or  acetaminophen (TYLENOL) suppository 650 mg (has no administration in time range)  heparin injection 5,000 Units (has no administration in time range)  hydrALAZINE (APRESOLINE) tablet 25 mg (has no administration in time range)  ondansetron (ZOFRAN) injection 4 mg (4 mg Intravenous Given 03/28/20 1451)    ED Course  I have  reviewed the triage vital signs and the nursing notes.  Pertinent labs & imaging results that were available during my care of the patient were reviewed by me and considered in my medical decision making (see chart for details).  Clinical Course as of Mar 28 1340    Sat Mar 28, 2020  1237 Reevaluated patient.  Rectal exam done with nurse as chaperone.  Sent to lab for guaiac.  Patient able to give a little more history.  He said he was in the garage and started feeling lightheaded like he might pass out.  He tried to get to the bathroom and was sitting on the toilet but continued to feel bad.  He knew his blood pressure was very low.  He denies any fall.   [MB]  6010 Chest x-ray showing some cardiomegaly and some increased opacity on the left side.  Looks similar to his prior x-ray 5/19.  Awaiting radiology reading.   [MB]  9323 Patient's labs significant for normal white count stable hemoglobin.  Creatinine elevated at 1.4 at baseline.  Glucose elevated to 223, is diabetic.  Stool is guaiac positive.   [MB]  1312 Patient had a colonoscopy 2014 with Dr. Ree Shay.  Single polyp removed.   [MB]  5573 Patient's wife here now and she confirms with the history we got from the patient and the paramedics.  She said this is happened before the last time was 2 years ago.  He was unresponsive and has low blood pressure and usually improves with IV fluids.   [MB]  2202 Cardiac echo 11/20 - 1. Left ventricular ejection fraction, by visual estimation, is 55 to  60%. The left ventricle has normal function. There is no left ventricular  hypertrophy.  2. Definity contrast agent was given IV to delineate the left ventricular  endocardial borders.  3. Left ventricular diastolic parameters are consistent with Grade II  diastolic dysfunction (pseudonormalization).  4. Global right ventricle has normal systolic function.The right  ventricular size is normal. No increase in right ventricular wall  thickness.  5. Left atrial size was normal.  6. Right atrial size was normal.  7. The mitral valve is normal in structure. No evidence of mitral valve  regurgitation.  8. The tricuspid valve is normal in structure. Tricuspid valve  regurgitation is trivial.  9.  The aortic valve is tricuspid. Aortic valve regurgitation is trivial.  Moderate aortic valve stenosis.  10. The pulmonic valve was normal in structure. Pulmonic valve  regurgitation is not visualized.  11. Aortic dilatation noted.  12. 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  13. The atrial septum is grossly normal.   [MB]    Clinical Course User Index [MB] Hayden Rasmussen, MD   MDM Rules/Calculators/A&P                     This patient complains of syncope altered mental status; this involves an extensive number of treatment Options and is a complaint that carries with it a high risk of complications and Morbidity. The differential includes hypotension, arrhythmia, GI bleed, PE, metabolic derangement  I ordered, reviewed and interpreted labs, which included normal white count normal hemoglobin.  Baseline elevated creatinine and glucose. I ordered medication IV fluids with improvement in his blood pressure I ordered imaging studies which included chest x-ray and head CT and I independently    visualized and interpreted imaging which showed no gross abnormalities Additional history  obtained from patient's wife Previous records obtained and reviewed including his prior admission for similar I consulted [facility] hospitalist Dr. Roosevelt Locks and discussed lab and imaging findings  After the interventions stated above, I reevaluated the patient and found improvement in his blood pressure and his symptoms.  Due to the patient's significant hypotension in the field and age, heme positive tectal exam  he is not a low risk candidate by [facility] syncope rules.  Discussed with Dr. Roosevelt Locks from [facility] hospitalist will evaluate the patient for admission   Final Clinical Impression(s) / ED Diagnoses Final diagnoses:  Syncope and collapse  Altered mental status, unspecified altered mental status type  Guaiac + stool    Rx / DC Orders ED Discharge Orders    None        Hayden Rasmussen, MD 03/28/20 2214

## 2020-03-28 NOTE — ED Notes (Signed)
While taking orthostatic vital signs about 2 minutes after the patient stood, his pulse dropped to 35 and stayed below 50 for about 30 seconds. Pt remained alert and was talking to this user indicating he did not feel any discomfort or distress and was able to complete the maneuver. At the 3 minute measure, pulse was back up to 50, recorded below.

## 2020-03-29 ENCOUNTER — Inpatient Hospital Stay (HOSPITAL_COMMUNITY): Payer: Medicare Other

## 2020-03-29 DIAGNOSIS — I350 Nonrheumatic aortic (valve) stenosis: Secondary | ICD-10-CM

## 2020-03-29 DIAGNOSIS — I459 Conduction disorder, unspecified: Secondary | ICD-10-CM | POA: Diagnosis not present

## 2020-03-29 DIAGNOSIS — N183 Chronic kidney disease, stage 3 unspecified: Secondary | ICD-10-CM

## 2020-03-29 DIAGNOSIS — I340 Nonrheumatic mitral (valve) insufficiency: Secondary | ICD-10-CM | POA: Diagnosis not present

## 2020-03-29 DIAGNOSIS — N1831 Chronic kidney disease, stage 3a: Secondary | ICD-10-CM

## 2020-03-29 LAB — CBC
HCT: 39.4 % (ref 39.0–52.0)
Hemoglobin: 13.4 g/dL (ref 13.0–17.0)
MCH: 30.9 pg (ref 26.0–34.0)
MCHC: 34 g/dL (ref 30.0–36.0)
MCV: 90.8 fL (ref 80.0–100.0)
Platelets: 197 10*3/uL (ref 150–400)
RBC: 4.34 MIL/uL (ref 4.22–5.81)
RDW: 12.9 % (ref 11.5–15.5)
WBC: 8 10*3/uL (ref 4.0–10.5)
nRBC: 0 % (ref 0.0–0.2)

## 2020-03-29 LAB — BASIC METABOLIC PANEL
Anion gap: 10 (ref 5–15)
BUN: 24 mg/dL — ABNORMAL HIGH (ref 8–23)
CO2: 25 mmol/L (ref 22–32)
Calcium: 8.6 mg/dL — ABNORMAL LOW (ref 8.9–10.3)
Chloride: 102 mmol/L (ref 98–111)
Creatinine, Ser: 1.42 mg/dL — ABNORMAL HIGH (ref 0.61–1.24)
GFR calc Af Amer: 54 mL/min — ABNORMAL LOW (ref >60)
GFR calc non Af Amer: 47 mL/min — ABNORMAL LOW (ref >60)
Glucose, Bld: 86 mg/dL (ref 70–99)
Potassium: 3.7 mmol/L (ref 3.5–5.1)
Sodium: 137 mmol/L (ref 135–145)

## 2020-03-29 LAB — ECHOCARDIOGRAM COMPLETE

## 2020-03-29 LAB — PROTIME-INR
INR: 1 (ref 0.8–1.2)
Prothrombin Time: 13.4 seconds (ref 11.4–15.2)

## 2020-03-29 LAB — GLUCOSE, CAPILLARY
Glucose-Capillary: 95 mg/dL (ref 70–99)
Glucose-Capillary: 174 mg/dL — ABNORMAL HIGH (ref 70–99)
Glucose-Capillary: 58 mg/dL — ABNORMAL LOW (ref 70–99)
Glucose-Capillary: 93 mg/dL (ref 70–99)
Glucose-Capillary: 104 mg/dL — ABNORMAL HIGH (ref 70–99)

## 2020-03-29 LAB — MAGNESIUM: Magnesium: 1.4 mg/dL — ABNORMAL LOW (ref 1.7–2.4)

## 2020-03-29 MED ORDER — PERFLUTREN LIPID MICROSPHERE
1.0000 mL | INTRAVENOUS | Status: AC | PRN
  Administered 2020-03-29: 2 mL via INTRAVENOUS
  Filled 2020-03-29: qty 10

## 2020-03-29 MED ORDER — MAGNESIUM SULFATE 2 GM/50ML IV SOLN
2.0000 g | Freq: Once | INTRAVENOUS | Status: AC
  Administered 2020-03-29: 2 g via INTRAVENOUS
  Filled 2020-03-29: qty 50

## 2020-03-29 MED ORDER — INSULIN DETEMIR 100 UNIT/ML ~~LOC~~ SOLN
10.0000 [IU] | Freq: Every day | SUBCUTANEOUS | Status: DC
  Administered 2020-03-29: 10 [IU] via SUBCUTANEOUS
  Filled 2020-03-29 (×3): qty 0.1

## 2020-03-29 NOTE — Progress Notes (Signed)
PROGRESS NOTE    Shane [name]  XFG:[id number] DOB: 1941/09/29 DOA: 03/28/2020 PCP: Burnard Bunting, MD     Brief Narrative:  Shane [name] is a 79 y.o. male with medical history significant of sick sinus syndrome, second-degree AV block, moderate aortic stenosis, IDDM, hypertension with labile blood pressure, chronic diastolic CHF, idiopathic pulmonary fibrosis, CKD stage II, RBBB, presented with near syncope.  Patient had a similar episode 2 years ago and has been following with his cardiologist.  According to the patient, he was on beta-blocker before which was discontinued after finding of episode of bradycardia.  This morning, while sitting on the toilet, of sudden, patient developed palpitation and lightheadedness; wife found patient was unresponsive, not answering any questions but remains his conscious.  Patient described as he was able to understand everything his wife was saying but he could not talk.  Wife called 8, EMS arrived and found patient bradycardia with heart rate in the 96V and systolic blood pressures 89F.  Was given 800 mL of saline and 1 dose of Zofran, symptoms improved.  Patient also reported he passed stools with drips of blood this morning during the episode. He was found to have second-degree AV block and admitted to the hospital. Cardiology consulted.   New events last 24 hours / Subjective: Admits to some nausea after breakfast but otherwise no chest pain, shortness of breath, lightheadedness or dizziness.  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 Chase Caller  Hematochezia -FOBT positive -Hemoglobin stable 13.4 -Patient sees [facility], Dr. Henrene Pastor.  Follow-up as an outpatient  Hypomagnesemia -Replace, trend   DVT prophylaxis: SCD Code Status: Full code Family Communication: No family at bedside Disposition Plan:   Status is: Inpatient  Remains inpatient appropriate because:Hemodynamically unstable, Ongoing diagnostic testing needed not appropriate for outpatient work up, IV treatments appropriate due to intensity of illness or inability to take PO and Inpatient level of care appropriate due to severity of illness   Dispo: The patient is from: Home              Anticipated d/c is to: Home              Anticipated d/c date is: 2 days              Patient currently is not medically stable to d/c.    Consultants:   Cardiology  Procedures:   None  Antimicrobials:  Anti-infectives (From admission, onward)   None        Objective: Vitals:   03/28/20 1751 03/28/20 2039 03/29/20 0455 03/29/20 0751  BP: (!) 154/64 (!) 145/52 (!) 166/67 140/64  Pulse: (!) 56 62 61 (!) 53  Resp: 18 18 18 18   Temp: 98.3 F (36.8 C) 98.2 F (36.8 C) 97.6 F (36.4 C) 97.9 F (  36.6 C)  TempSrc: Oral Oral Oral Oral  SpO2: 100% 99% 99% 97%    Intake/Output Summary (Last 24 hours) at 03/29/2020 1045 Last data filed at 03/29/2020 0456 Gross per 24 hour  Intake 1020 ml  Output 1000 ml  Net 20 ml   There were no vitals filed for this visit.  Examination:  General exam: Appears calm and comfortable  Respiratory system: Clear to  auscultation. Respiratory effort normal. No respiratory distress. No conversational dyspnea.  Cardiovascular system: S1 & S2 heard, irregular rhythm, rate 60s. +murmur. No pedal edema. Gastrointestinal system: Abdomen is nondistended, soft and nontender. Normal bowel sounds heard. Central nervous system: Alert and oriented. No focal neurological deficits. Speech clear.  Extremities: Symmetric in appearance  Skin: No rashes, lesions or ulcers on exposed skin  Psychiatry: Judgement and insight appear normal. Mood & affect appropriate.   Data Reviewed: I have personally reviewed following labs and imaging studies  CBC: Recent Labs  Lab 03/28/20 1221 03/29/20 0447  WBC 7.0 8.0  NEUTROABS 4.3  --   HGB 14.0 13.4  HCT 42.2 39.4  MCV 93.6 90.8  PLT 192 427   Basic Metabolic Panel: Recent Labs  Lab 03/28/20 1221 03/28/20 1448 03/29/20 0447  NA 137  --  137  K 4.9  --  3.7  CL 107  --  102  CO2 23  --  25  GLUCOSE 223*  --  86  BUN 28*  --  24*  CREATININE 1.40*  --  1.42*  CALCIUM 8.4*  --  8.6*  MG  --  1.6* 1.4*   GFR: CrCl cannot be calculated (Unknown ideal weight.). Liver Function Tests: No results for input(s): AST, ALT, ALKPHOS, BILITOT, PROT, ALBUMIN in the last 168 hours. No results for input(s): LIPASE, AMYLASE in the last 168 hours. No results for input(s): AMMONIA in the last 168 hours. Coagulation Profile: Recent Labs  Lab 03/28/20 1221 03/29/20 0447  INR 1.1 1.0   Cardiac Enzymes: No results for input(s): CKTOTAL, CKMB, CKMBINDEX, TROPONINI in the last 168 hours. BNP (last 3 results) No results for input(s): PROBNP in the last 8760 hours. HbA1C: Recent Labs    03/28/20 1448  HGBA1C 7.6*   CBG: Recent Labs  Lab 03/28/20 1246 03/28/20 2038 03/29/20 0714  GLUCAP 193* 141* 95   Lipid Profile: No results for input(s): CHOL, HDL, LDLCALC, TRIG, CHOLHDL, LDLDIRECT in the last 72 hours. Thyroid Function Tests: Recent Labs    03/28/20 1448  TSH  3.258   Anemia Panel: No results for input(s): VITAMINB12, FOLATE, FERRITIN, TIBC, IRON, RETICCTPCT in the last 72 hours. Sepsis Labs: No results for input(s): PROCALCITON, LATICACIDVEN in the last 168 hours.  Recent Results (from the past 240 hour(s))  Respiratory Panel by RT PCR (Flu A&B, Covid) - Nasopharyngeal Swab     Status: None   Collection Time: 03/28/20  3:36 PM   Specimen: Nasopharyngeal Swab  Result Value Ref Range Status   SARS Coronavirus 2 by RT PCR NEGATIVE NEGATIVE Final    Comment: (NOTE) SARS-CoV-2 target nucleic acids are NOT DETECTED. The SARS-CoV-2 RNA is generally detectable in upper respiratoy specimens during the acute phase of infection. The lowest concentration of SARS-CoV-2 viral copies this assay can detect is 131 copies/mL. A negative result does not preclude SARS-Cov-2 infection and should not be used as the sole basis for treatment or other patient management decisions. A negative result may occur with  improper specimen collection/handling, submission of specimen other than nasopharyngeal  swab, presence of viral mutation(s) within the areas targeted by this assay, and inadequate number of viral copies (<131 copies/mL). A negative result must be combined with clinical observations, patient history, and epidemiological information. The expected result is Negative. Fact Sheet for Patients:  PinkCheek.be Fact Sheet for Healthcare Providers:  GravelBags.it This test is not yet ap proved or cleared by the [place] FDA and  has been authorized for detection and/or diagnosis of SARS-CoV-2 by FDA under an Emergency Use Authorization (EUA). This EUA will remain  in effect (meaning this test can be used) for the duration of the COVID-19 declaration under Section 564(b)(1) of the Act, 21 U.S.C. section 360bbb-3(b)(1), unless the authorization is terminated or revoked sooner.    Influenza A by PCR  NEGATIVE NEGATIVE Final   Influenza B by PCR NEGATIVE NEGATIVE Final    Comment: (NOTE) The Xpert Xpress SARS-CoV-2/FLU/RSV assay is intended as an aid in  the diagnosis of influenza from Nasopharyngeal swab specimens and  should not be used as a sole basis for treatment. Nasal washings and  aspirates are unacceptable for Xpert Xpress SARS-CoV-2/FLU/RSV  testing. Fact Sheet for Patients: PinkCheek.be Fact Sheet for Healthcare Providers: GravelBags.it This test is not yet approved or cleared by the [place] FDA and  has been authorized for detection and/or diagnosis of SARS-CoV-2 by  FDA under an Emergency Use Authorization (EUA). This EUA will remain  in effect (meaning this test can be used) for the duration of the  Covid-19 declaration under Section 564(b)(1) of the Act, 21  U.S.C. section 360bbb-3(b)(1), unless the authorization is  terminated or revoked. Performed at [facility], [place] [address], [place], [place] [address]       Radiology Studies: CT Head Wo Contrast  Result Date: 03/28/2020 CLINICAL DATA:  Bloody diarrhea, syncope, encephalopathy EXAM: CT HEAD WITHOUT CONTRAST TECHNIQUE: Contiguous axial images were obtained from the base of the skull through the vertex without intravenous contrast. COMPARISON:  Prior CT head 08/17/2019 FINDINGS: Brain: No evidence of acute infarction, hemorrhage, hydrocephalus, extra-axial collection or mass lesion/mass effect. Mild cerebral cortical atrophy. Vascular: No hyperdense vessel or unexpected calcification. Skull: Normal. Negative for fracture or focal lesion. Sinuses/Orbits: Minimal mucoperiosteal thickening. No active sinusitis. Orbits are unremarkable. Other: None. IMPRESSION: 1. No acute intracranial abnormality. Electronically Signed   By: Jacqulynn Cadet M.D.   On: 03/28/2020 13:51   DG Chest Port 1 View  Result Date: 03/28/2020 CLINICAL DATA:  Syncope.   Shortness of breath. EXAM: PORTABLE CHEST 1 VIEW COMPARISON:  February 25, 2018 FINDINGS: The cardiomediastinal silhouette is stable. The lungs are unchanged since May of 2019. Opacities are consistent with known pulmonary fibrosis. No new infiltrate. No interval changes. IMPRESSION: Known pulmonary fibrosis.  No acute interval changes. Electronically Signed   By: Dorise Bullion III M.D   On: 03/28/2020 13:20      Scheduled Meds: . vitamin C  1,000 mg Oral Daily  . aspirin EC  81 mg Oral Daily  . insulin aspart  0-5 Units Subcutaneous QHS  . insulin aspart  0-9 Units Subcutaneous TID WC  . insulin detemir  22 Units Subcutaneous QHS  . losartan  50 mg Oral Daily  . multivitamin with minerals  1 tablet Oral Daily  . pantoprazole  40 mg Oral Daily  . sodium chloride flush  3 mL Intravenous Q12H   Continuous Infusions:   LOS: 1 day      Time spent: 35 minutes   Dessa Phi, DO  [facility] 03/29/2020, 10:45 AM   Available via Epic secure chat 7am-7pm After these hours, please refer to coverage provider listed on [email or web address]

## 2020-03-29 NOTE — Progress Notes (Signed)
Pt has home CPAP at bedside, and states he is good for the night.  Advised pt to notify for RT if any further assistance is needed.

## 2020-03-29 NOTE — Progress Notes (Signed)
  Echocardiogram 2D Echocardiogram has been performed.  Shane [name] F 03/29/2020, 11:29 AM

## 2020-03-29 NOTE — Progress Notes (Signed)
Hypoglycemic Event  CBG: 78  Treatment: Apple Juice  80z Symptoms: dizzy, blurry vision  Follow-up CBG: Time:0500 CBG Result:81  Possible Reasons for Event: 22 units of levemir/ no snack at night/didnt eat well yesterday.  Comments/MD notified: pt states that he barely gets this low and it is usually around 120s. Patient has insulin pump.     Shane [name]

## 2020-03-29 NOTE — Progress Notes (Signed)
Progress Note  Patient Name: Shane [name] Date of Encounter: 03/29/2020  Primary Cardiologist: Kirk Ruths, MD   Subjective   Had a little dizziness this AM. Denies angina or dyspnea. Continues to have AV wenckebach cycles, with varying degrees of bradycardia (as low as the 30s).  Inpatient Medications    Scheduled Meds: . vitamin C  1,000 mg Oral Daily  . aspirin EC  81 mg Oral Daily  . insulin aspart  0-5 Units Subcutaneous QHS  . insulin aspart  0-9 Units Subcutaneous TID WC  . insulin detemir  22 Units Subcutaneous QHS  . losartan  50 mg Oral Daily  . multivitamin with minerals  1 tablet Oral Daily  . pantoprazole  40 mg Oral Daily  . sodium chloride flush  3 mL Intravenous Q12H   Continuous Infusions:  PRN Meds: acetaminophen **OR** acetaminophen, albuterol, diazepam, hydrALAZINE, perflutren lipid microspheres (DEFINITY) IV suspension   Vital Signs    Vitals:   03/28/20 1751 03/28/20 2039 03/29/20 0455 03/29/20 0751  BP: (!) 154/64 (!) 145/52 (!) 166/67 140/64  Pulse: (!) 56 62 61 (!) 53  Resp: 18 18 18 18   Temp: 98.3 F (36.8 C) 98.2 F (36.8 C) 97.6 F (36.4 C) 97.9 F (36.6 C)  TempSrc: Oral Oral Oral Oral  SpO2: 100% 99% 99% 97%    Intake/Output Summary (Last 24 hours) at 03/29/2020 1152 Last data filed at 03/29/2020 0456 Gross per 24 hour  Intake 1020 ml  Output 1000 ml  Net 20 ml   Last 3 Weights 03/13/2020 03/12/2020 02/21/2020  Weight (lbs) 269 lb 268 lb 268 lb  Weight (kg) 122.018 kg 121.564 kg 121.564 kg      Telemetry    Incessant second degree AV block, MT1, mostly 3:2 cycles, occasional 2:1 AV block - Personally Reviewed  ECG    SR with second degree AV block, MT1, RBBB+LAFB - Personally Reviewed  Physical Exam  Obese GEN: No acute distress.   Neck: No JVD Cardiac: RRR, early peaking AS murmur, no diastolic murmurs, rubs, or gallops.  Respiratory: Clear to auscultation bilaterally. GI: Soft, nontender, non-distended  MS: No  edema; No deformity. Neuro:  Nonfocal  Psych: Normal affect   Labs    High Sensitivity Troponin:  No results for input(s): TROPONINIHS in the last 720 hours.    Chemistry Recent Labs  Lab 03/28/20 1221 03/29/20 0447  NA 137 137  K 4.9 3.7  CL 107 102  CO2 23 25  GLUCOSE 223* 86  BUN 28* 24*  CREATININE 1.40* 1.42*  CALCIUM 8.4* 8.6*  GFRNONAA 48* 47*  GFRAA 55* 54*  ANIONGAP 7 10     Hematology Recent Labs  Lab 03/28/20 1221 03/29/20 0447  WBC 7.0 8.0  RBC 4.51 4.34  HGB 14.0 13.4  HCT 42.2 39.4  MCV 93.6 90.8  MCH 31.0 30.9  MCHC 33.2 34.0  RDW 13.2 12.9  PLT 192 197    BNPNo results for input(s): BNP, PROBNP in the last 168 hours.   DDimer No results for input(s): DDIMER in the last 168 hours.   Radiology    CT Head Wo Contrast  Result Date: 03/28/2020 CLINICAL DATA:  Bloody diarrhea, syncope, encephalopathy EXAM: CT HEAD WITHOUT CONTRAST TECHNIQUE: Contiguous axial images were obtained from the base of the skull through the vertex without intravenous contrast. COMPARISON:  Prior CT head 08/17/2019 FINDINGS: Brain: No evidence of acute infarction, hemorrhage, hydrocephalus, extra-axial collection or mass lesion/mass effect. Mild cerebral cortical atrophy.  Vascular: No hyperdense vessel or unexpected calcification. Skull: Normal. Negative for fracture or focal lesion. Sinuses/Orbits: Minimal mucoperiosteal thickening. No active sinusitis. Orbits are unremarkable. Other: None. IMPRESSION: 1. No acute intracranial abnormality. Electronically Signed   By: Jacqulynn Cadet M.D.   On: 03/28/2020 13:51   DG Chest Port 1 View  Result Date: 03/28/2020 CLINICAL DATA:  Syncope.  Shortness of breath. EXAM: PORTABLE CHEST 1 VIEW COMPARISON:  February 25, 2018 FINDINGS: The cardiomediastinal silhouette is stable. The lungs are unchanged since May of 2019. Opacities are consistent with known pulmonary fibrosis. No new infiltrate. No interval changes. IMPRESSION: Known pulmonary  fibrosis.  No acute interval changes. Electronically Signed   By: Dorise Bullion III M.D   On: 03/28/2020 13:20   ECHOCARDIOGRAM COMPLETE  Result Date: 03/29/2020    ECHOCARDIOGRAM REPORT   Patient Name:   Shane [name] Date of Exam: 03/29/2020 Medical Rec #:  [id number]     Height:       72.0 in Accession #:    [id number]    Weight:       269.0 lb Date of Birth:  1940/12/29     BSA:          2.417 m Patient Age:    79 years      BP:           140/64 mmHg Patient Gender: M             HR:           44 bpm. Exam Location:  Inpatient Procedure: 2D Echo, Cardiac Doppler and Color Doppler Indications:    R55 Syncope  History:        Patient has prior history of Echocardiogram examinations, most                 recent 10/16/2019.  Sonographer:    Merrie Roof RDCS Referring Phys: [id number] [place]  1. Left ventricular ejection fraction, by estimation, is 60 to 65%. The left ventricle has normal function. The left ventricle has no regional wall motion abnormalities. There is mild concentric left ventricular hypertrophy. Diastolic function assessment is limited by second degree AV block, Mobitz type 1, but annular velocities are in normal range.  2. Right ventricular systolic function is normal. The right ventricular size is normal. Tricuspid regurgitation signal is inadequate for assessing PA pressure.  3. Left atrial size was moderately dilated.  4. The mitral valve is normal in structure. Mild mitral valve regurgitation.  5. The aortic valve is tricuspid. Aortic valve regurgitation is not visualized. Mild aortic valve stenosis.  6. Aortic dilatation noted. 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 Comparison(s): No significant change from prior study. Prior images reviewed side by side. FINDINGS  Left Ventricle: Left ventricular ejection fraction, by estimation, is 60 to 65%. The left ventricle has normal function. The left ventricle has no regional wall motion abnormalities. The  left ventricular internal cavity size was normal in size. There is  mild concentric left ventricular hypertrophy. Diastolic function assessment is limited by second degree AV block, Mobitz type 1, but annular velocities are in normal range. Right Ventricle: The right ventricular size is normal. No increase in right ventricular wall thickness. Right ventricular systolic function is normal. Tricuspid regurgitation signal is inadequate for assessing PA pressure. Left Atrium: Left atrial size was moderately dilated. Right Atrium: Right atrial size was normal in size. Pericardium: There is no evidence of pericardial effusion. Mitral  Valve: The mitral valve is normal in structure. There is mild thickening of the mitral valve leaflet(s). Mild mitral valve regurgitation. Tricuspid Valve: The tricuspid valve is normal in structure. Tricuspid valve regurgitation is not demonstrated. Aortic Valve: The aortic valve is tricuspid. . There is moderate thickening and mild calcification of the aortic valve. Aortic valve regurgitation is not visualized. Mild aortic stenosis is present. There is moderate thickening of the aortic valve. There  is mild calcification of the aortic valve. Aortic valve mean gradient measures 18.0 mmHg. Aortic valve peak gradient measures 30.0 mmHg. Aortic valve area, by VTI measures 1.23 cm. Pulmonic Valve: The pulmonic valve was normal in structure. Pulmonic valve regurgitation is not visualized. Aorta: Aortic dilatation noted. 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 IAS/Shunts: No atrial level shunt detected by color flow Doppler.  LEFT VENTRICLE PLAX 2D LVIDd:         5.00 cm LVIDs:         3.30 cm LV PW:         1.30 cm LV IVS:        1.40 cm LVOT diam:     2.20 cm LV SV:         86 LV SV Index:   36 LVOT Area:     3.80 cm  LV Volumes (MOD) LV vol d, MOD A2C: 133.0 ml LV vol d, MOD A4C: 98.8 ml LV vol s, MOD A2C: 30.9 ml LV vol s, MOD A4C: 14.1 ml LV SV MOD A2C:     102.1 ml  LV SV MOD A4C:     98.8 ml LV SV MOD BP:      96.0 ml RIGHT VENTRICLE             IVC RV Basal diam:  4.50 cm     IVC diam: 1.80 cm RV Mid diam:    4.00 cm RV S prime:     11.60 cm/s TAPSE (M-mode): 1.8 cm LEFT ATRIUM              Index       RIGHT ATRIUM           Index LA diam:        4.80 cm  1.99 cm/m  RA Area:     31.40 cm LA Vol (A2C):   103.0 ml 42.62 ml/m RA Volume:   125.00 ml 51.73 ml/m LA Vol (A4C):   97.0 ml  40.14 ml/m LA Biplane Vol: 101.0 ml 41.79 ml/m  AORTIC VALVE AV Area (Vmax):    1.13 cm AV Area (Vmean):   1.12 cm AV Area (VTI):     1.23 cm AV Vmax:           274.00 cm/s AV Vmean:          203.000 cm/s AV VTI:            0.701 m AV Peak Grad:      30.0 mmHg AV Mean Grad:      18.0 mmHg LVOT Vmax:         81.60 cm/s LVOT Vmean:        60.000 cm/s LVOT VTI:          0.226 m LVOT/AV VTI ratio: 0.32  AORTA Ao Root diam: 3.60 cm Ao Asc diam:  4.20 cm MITRAL VALVE MV Area (PHT): 4.10 cm     SHUNTS MV Decel Time: 185 msec     Systemic VTI:  0.23 m MV E velocity: 100.00 cm/s  Systemic Diam: 2.20 cm MV A velocity: 94.30 cm/s MV E/A ratio:  1.06 Hanadi Stanly MD Electronically signed by Sanda Klein MD Signature Date/Time: 03/29/2020/11:52:31 AM    Final     Cardiac Studies   ECHO today  1. Left ventricular ejection fraction, by estimation, is 60 to 65%. The  left ventricle has normal function. The left ventricle has no regional  wall motion abnormalities. There is mild concentric left ventricular  hypertrophy. Diastolic function  assessment is limited by second degree AV block, Mobitz type 1, but  annular velocities are in normal range.  2. Right ventricular systolic function is normal. The right ventricular  size is normal. Tricuspid regurgitation signal is inadequate for assessing  PA pressure.  3. Left atrial size was moderately dilated.  4. The mitral valve is normal in structure. Mild mitral valve  regurgitation.  5. The aortic valve is tricuspid. Aortic valve  regurgitation is not  visualized. Mild aortic valve stenosis.  6. Aortic dilatation noted. 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   Comparison(s): No significant change from prior study. Prior images  reviewed side by side.   Patient Profile     79 y.o. male history of nonobs CAD, Mobitz I, HTN, HL, moderate aortic stenosis, CKD III, DMII, pulmonary fibrosis, OSA, and obesity, presenting after an episode of syncope  Assessment & Plan    1. Second degree AV block: not on any meds that would cause this. Also has evidence of infrahisian disease. Will ask for EP opinion. Discussed pacemaker therapy with patient and wife.  2. AS: mild. Unlikely to be a cause of syncope or dyspnea. 3. Interstitial pulmonary fibrosis: enrolled in clinical trial of a connective tissue growth factor that inhibits fibrosis. D/W his pulmonary doctor and clinical investigator: surgery does not need to be delayed. Dr. Marjean Donna will check to see if next infusion will need to be delayed.     For questions or updates, please contact [place] Please consult [email or web address] for contact info under        Signed, Sanda Klein, MD  03/29/2020, 11:52 AM

## 2020-03-29 NOTE — Consult Note (Signed)
Cardiology Consultation:   Patient ID: Shane [name] MRN: [id number]; DOB: 04/25/1941  Admit date: 03/28/2020 Date of Consult: 03/29/2020  Primary Care Provider: Burnard Bunting, MD Primary Cardiologist: Kirk Ruths, MD  Primary Electrophysiologist:  None    Patient Profile:   Shane [name] is a 79 y.o. male with a hx of syncope who is being seen today for the evaluation of syncope at the request of Dr. Sallyanne Kuster.  History of Present Illness:   Shane [name] has pulmonary fibrosis but also notes a h/o of syncope for several years, occurring a couple of times a year and characterized as having warning at times and no warning at other times. He will be out for several seconds. 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 The patient usually has a warning. He notes both urinary and fecal incontinence. He has known mild AS with a mean gradient of 18.    Past Medical History:  Diagnosis Date  . 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 Cancer ([place])    ling  . Cataract   . Chronic kidney disease    d/t DM  . CKD (chronic kidney disease), stage III   . Diabetes mellitus    Vgo disposal insulin bolus  simular to insulin pump  . Dyspnea   . GERD (gastroesophageal reflux disease)   . Hyperlipidemia   . Hypertension   . Idiopathic pulmonary fibrosis ([place]) 11/2016  . ILD (interstitial lung disease) ([place])   . Moderate aortic stenosis    a. 10/2019 Echo: EF 55-60%, Gr2 DD. Nl RV.   Marland Kitchen Neuromuscular disorder ([place])   . Nonobstructive CAD (coronary artery disease)    a. 2012 Cath: mod, nonobs dzs; b. 10/2016 MV: EF 60%, no ischemia.  Marland Kitchen PONV (postoperative nausea and vomiting)   . Sleep apnea     uses cpap asked to bring mask and tubing    Past Surgical History:  Procedure Laterality Date  . ANTERIOR FUSION CERVICAL SPINE  2012  . CARDIAC CATHETERIZATION  2011  . CARDIAC CATHETERIZATION N/A 11/09/2016   Procedure: Right Heart Cath;  Surgeon: Belva Crome, MD;   Location: [facility];  Service: Cardiovascular;  Laterality: N/A;  . carpel tunnel     left wrist  . CATARACT EXTRACTION    . CATARACT EXTRACTION W/ INTRAOCULAR LENS  IMPLANT, BILATERAL  2013  . CERVICAL LAMINECTOMY  2012  . COLONOSCOPY N/A 01/14/2013   Procedure: COLONOSCOPY;  Surgeon: Irene Shipper, MD;  Location: [facility];  Service: Endoscopy;  Laterality: N/A;  . EYE SURGERY    . [place]   left  . LUMBAR LAMINECTOMY  2003  . LUNG BIOPSY Left 12/26/2016   Procedure: LUNG BIOPSY;  Surgeon: [name], MD;  Location: [facility];  Service: Thoracic;  Laterality: Left;  . POSTERIOR FUSION CERVICAL SPINE  2012  . TRIGGER FINGER RELEASE  2011   4th finger left hand  . VIDEO ASSISTED THORACOSCOPY Left 12/26/2016   Procedure: VIDEO ASSISTED THORACOSCOPY;  Surgeon: [name], MD;  Location: [facility];  Service: Thoracic;  Laterality: Left;  Marland Kitchen VIDEO BRONCHOSCOPY N/A 12/26/2016   Procedure: VIDEO BRONCHOSCOPY;  Surgeon: [name], MD;  Location: [facility];  Service: Thoracic;  Laterality: N/A;     Home Medications:  Prior to Admission medications   Medication Sig Start Date End Date Taking? Authorizing Provider  albuterol (PROAIR HFA) 108 (90 Base) MCG/ACT  inhaler Inhale 2 puffs into the lungs every 4 (four) hours as needed for wheezing or shortness of breath.   Yes [provider]  Ascorbic Acid (VITAMIN C) 1000 MG tablet Take 1,000 mg by mouth daily.   Yes [provider]  aspirin EC 81 MG tablet Take 81 mg by mouth daily.   Yes [provider]  clotrimazole-betamethasone (LOTRISONE) cream Apply 1 application topically daily as needed (for skin infection).  01/16/17  Yes [provider]  EPINEPHrine 0.3 mg/0.3 mL IJ SOAJ injection Inject 0.3 mg into the muscle once as needed for anaphylaxis.  01/30/18  Yes [provider]  furosemide (LASIX) 40 MG tablet Take 20 mg by mouth daily.  02/15/18  Yes [provider]  insulin detemir (LEVEMIR) 100 UNIT/ML injection Inject 12 Units into the skin at bedtime.    Yes [provider]  insulin lispro (HUMALOG) 100 UNIT/ML injection Inject 40 Units into the skin See admin instructions. Use as directed with V-Go 40 insulin pump   Yes [provider]  ketotifen (ZADITOR) 0.025 % ophthalmic solution Place 1 drop into both eyes 2 (two) times daily as needed (pollen allergies).   Yes [provider]  LORazepam (ATIVAN) 1 MG tablet Take 1 mg by mouth at bedtime as needed for sleep.  03/20/20  Yes [provider]  losartan (COZAAR) 50 MG tablet Take 50 mg by mouth daily.   Yes [provider]  Multiple Vitamin (MULTIVITAMIN WITH MINERALS) TABS tablet Take 1 tablet by mouth daily.   Yes [provider]  pantoprazole (PROTONIX) 40 MG tablet Take 40 mg by mouth daily.     Yes [provider]  potassium chloride SA (KLOR-CON) 20 MEQ tablet Take 20 mEq by mouth 2 (two) times daily.   Yes [provider]  Continuous Blood Gluc Sensor (FREESTYLE LIBRE 14 DAY SENSOR) MISC 1 patch every 14 (fourteen) days.    [provider]    Inpatient Medications: Scheduled Meds: . vitamin C  1,000 mg Oral Daily  . aspirin EC  81 mg Oral Daily  . insulin aspart  0-5 Units Subcutaneous QHS  . insulin aspart  0-9 Units Subcutaneous TID WC  . insulin detemir  22 Units Subcutaneous QHS  . losartan  50 mg Oral Daily  . multivitamin with minerals  1 tablet Oral Daily  . pantoprazole  40 mg Oral Daily  . sodium chloride flush  3 mL Intravenous Q12H   Continuous Infusions:  PRN Meds: acetaminophen **OR** acetaminophen, albuterol, diazepam, hydrALAZINE  Allergies:    Allergies  Allergen Reactions  . Codeine Hives and Itching  . Other Diarrhea and Other (See Comments)    Esbriet causes elevated LFTs. D/C on 06/14/17 and SEVERE DIARRHEA  . Ofev [Nintedanib] Diarrhea    SEVERE DIARRHEA    Social  History:   Social History   Socioeconomic History  . Marital status: Married    Spouse name: Not on file  . Number of children: Not on file  . Years of education: Not on file  . Highest education level: Not on file  Occupational History  . Not on file  Tobacco Use  . Smoking status: Never Smoker  . Smokeless tobacco: Never Used  Substance and Sexual Activity  . Alcohol use: No  . Drug use: No  . Sexual activity: Not on file  Other Topics Concern  . 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  Social Determinants of Health   Financial Resource Strain:   . Difficulty of Paying Living Expenses:   Food Insecurity:   . Worried About Charity fundraiser in the Last Year:   . Arboriculturist in the Last Year:   Transportation Needs:   . Film/video editor (Medical):   Marland Kitchen Lack of Transportation (Non-Medical):   Physical Activity:   . Days of Exercise per Week:   . Minutes of Exercise per Session:   Stress:   . Feeling of Stress :   Social Connections:   . Frequency of Communication with Friends and Family:   . Frequency of Social Gatherings with Friends and Family:   . Attends Religious Services:   . Active Member of Clubs or Organizations:   . Attends Archivist Meetings:   Marland Kitchen Marital Status:   Intimate Partner Violence:   . Fear of Current or Ex-Partner:   . Emotionally Abused:   Marland Kitchen Physically Abused:   . Sexually Abused:     Family History:    Family History  Problem Relation Age of Onset  . Diabetes Mellitus II Mother   . Emphysema Father 61  . Colon cancer Neg Hx   . Esophageal cancer Neg Hx   . Rectal cancer Neg Hx   . Stomach cancer Neg Hx      ROS:  Please see the history of present illness.   All other ROS reviewed and negative.     Physical Exam/Data:   Vitals:   03/28/20 1751 03/28/20 2039 03/29/20 0455 03/29/20 0751  BP: (!) 154/64 (!) 145/52 (!) 166/67 140/64  Pulse: (!) 56 62 61 (!) 53  Resp: 18 18  18 18   Temp: 98.3 F (36.8 C) 98.2 F (36.8 C) 97.6 F (36.4 C) 97.9 F (36.6 C)  TempSrc: Oral Oral Oral Oral  SpO2: 100% 99% 99% 97%    Intake/Output Summary (Last 24 hours) at 03/29/2020 1604 Last data filed at 03/29/2020 0456 Gross per 24 hour  Intake 220 ml  Output 1000 ml  Net -780 ml   Last 3 Weights 03/13/2020 03/12/2020 02/21/2020  Weight (lbs) 269 lb 268 lb 268 lb  Weight (kg) 122.018 kg 121.564 kg 121.564 kg     There is no height or weight on file to calculate BMI.  General:  Well nourished, well developed, in no acute distress HEENT: normal Lymph: no adenopathy Neck: no JVD Endocrine:  No thryomegaly Vascular: No carotid bruits; FA pulses 2+ bilaterally without bruits  Cardiac:  normal S1, S2; RRR; no murmur  Lungs:  Scattered rales Abd: soft, nontender, no hepatomegaly  Ext: no edema Musculoskeletal:  No deformities, BUE and BLE strength normal and equal Skin: warm and dry  Neuro:  CNs 2-12 intact, no focal abnormalities noted Psych:  Normal affect   EKG:  The EKG was personally reviewed and demonstrates:  nsr with right bundle left axis, first degree AV block Telemetry:  Telemetry was personally reviewed and demonstrates:  NSR with AVWB  Relevant CV Studies: 2D echo reviewed  Laboratory Data:  High Sensitivity Troponin:  No results for input(s): TROPONINIHS in the last 720 hours.   Chemistry Recent Labs  Lab 03/28/20 1221 03/29/20 0447  NA 137 137  K 4.9 3.7  CL 107 102  CO2 23 25  GLUCOSE 223* 86  BUN 28* 24*  CREATININE 1.40* 1.42*  CALCIUM 8.4* 8.6*  GFRNONAA 48* 47*  GFRAA 55* 54*  ANIONGAP 7 10  No results for input(s): PROT, ALBUMIN, AST, ALT, ALKPHOS, BILITOT in the last 168 hours. Hematology Recent Labs  Lab 03/28/20 1221 03/29/20 0447  WBC 7.0 8.0  RBC 4.51 4.34  HGB 14.0 13.4  HCT 42.2 39.4  MCV 93.6 90.8  MCH 31.0 30.9  MCHC 33.2 34.0  RDW 13.2 12.9  PLT 192 197   BNPNo results for input(s): BNP, PROBNP in the last  168 hours.  DDimer No results for input(s): DDIMER in the last 168 hours.   Radiology/Studies:  CT Head Wo Contrast  Result Date: 03/28/2020 CLINICAL DATA:  Bloody diarrhea, syncope, encephalopathy EXAM: CT HEAD WITHOUT CONTRAST TECHNIQUE: Contiguous axial images were obtained from the base of the skull through the vertex without intravenous contrast. COMPARISON:  Prior CT head 08/17/2019 FINDINGS: Brain: No evidence of acute infarction, hemorrhage, hydrocephalus, extra-axial collection or mass lesion/mass effect. Mild cerebral cortical atrophy. Vascular: No hyperdense vessel or unexpected calcification. Skull: Normal. Negative for fracture or focal lesion. Sinuses/Orbits: Minimal mucoperiosteal thickening. No active sinusitis. Orbits are unremarkable. Other: None. IMPRESSION: 1. No acute intracranial abnormality. Electronically Signed   By: Jacqulynn Cadet M.D.   On: 03/28/2020 13:51   DG Chest Port 1 View  Result Date: 03/28/2020 CLINICAL DATA:  Syncope.  Shortness of breath. EXAM: PORTABLE CHEST 1 VIEW COMPARISON:  February 25, 2018 FINDINGS: The cardiomediastinal silhouette is stable. The lungs are unchanged since May of 2019. Opacities are consistent with known pulmonary fibrosis. No new infiltrate. No interval changes. IMPRESSION: Known pulmonary fibrosis.  No acute interval changes. Electronically Signed   By: Dorise Bullion III M.D   On: 03/28/2020 13:20   ECHOCARDIOGRAM COMPLETE  Result Date: 03/29/2020    ECHOCARDIOGRAM REPORT   Patient Name:   Shane [name] Date of Exam: 03/29/2020 Medical Rec #:  [id number]     Height:       72.0 in Accession #:    [id number]    Weight:       269.0 lb Date of Birth:  [birth year]     BSA:          2.417 m Patient Age:    14 years      BP:           140/64 mmHg Patient Gender: M             HR:           44 bpm. Exam Location:  Inpatient Procedure: 2D Echo, Cardiac Doppler and Color Doppler Indications:    R55 Syncope  History:        Patient has prior history  of Echocardiogram examinations, most                 recent 10/16/2019.  Sonographer:    Merrie Roof RDCS Referring Phys: [id number] [place]  1. Left ventricular ejection fraction, by estimation, is 60 to 65%. The left ventricle has normal function. The left ventricle has no regional wall motion abnormalities. There is mild concentric left ventricular hypertrophy. Diastolic function assessment is limited by second degree AV block, Mobitz type 1, but annular velocities are in normal range.  2. Right ventricular systolic function is normal. The right ventricular size is normal. Tricuspid regurgitation signal is inadequate for assessing PA pressure.  3. Left atrial size was moderately dilated.  4. The mitral valve is normal in structure. Mild mitral valve regurgitation.  5. The aortic valve is tricuspid. Aortic valve regurgitation is not visualized. Mild aortic valve stenosis.  6. Aortic dilatation noted. 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 Comparison(s): No significant change from prior study. Prior images reviewed side by side. FINDINGS  Left Ventricle: Left ventricular ejection fraction, by estimation, is 60 to 65%. The left ventricle has normal function. The left ventricle has no regional wall motion abnormalities. The left ventricular internal cavity size was normal in size. There is  mild concentric left ventricular hypertrophy. Diastolic function assessment is limited by second degree AV block, Mobitz type 1, but annular velocities are in normal range. Right Ventricle: The right ventricular size is normal. No increase in right ventricular wall thickness. Right ventricular systolic function is normal. Tricuspid regurgitation signal is inadequate for assessing PA pressure. Left Atrium: Left atrial size was moderately dilated. Right Atrium: Right atrial size was normal in size. Pericardium: There is no evidence of pericardial effusion. Mitral Valve: The mitral valve is  normal in structure. There is mild thickening of the mitral valve leaflet(s). Mild mitral valve regurgitation. Tricuspid Valve: The tricuspid valve is normal in structure. Tricuspid valve regurgitation is not demonstrated. Aortic Valve: The aortic valve is tricuspid. . There is moderate thickening and mild calcification of the aortic valve. Aortic valve regurgitation is not visualized. Mild aortic stenosis is present. There is moderate thickening of the aortic valve. There  is mild calcification of the aortic valve. Aortic valve mean gradient measures 18.0 mmHg. Aortic valve peak gradient measures 30.0 mmHg. Aortic valve area, by VTI measures 1.23 cm. Pulmonic Valve: The pulmonic valve was normal in structure. Pulmonic valve regurgitation is not visualized. Aorta: Aortic dilatation noted. 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 IAS/Shunts: No atrial level shunt detected by color flow Doppler.  LEFT VENTRICLE PLAX 2D LVIDd:         5.00 cm LVIDs:         3.30 cm LV PW:         1.30 cm LV IVS:        1.40 cm LVOT diam:     2.20 cm LV SV:         86 LV SV Index:   36 LVOT Area:     3.80 cm  LV Volumes (MOD) LV vol d, MOD A2C: 133.0 ml LV vol d, MOD A4C: 98.8 ml LV vol s, MOD A2C: 30.9 ml LV vol s, MOD A4C: 14.1 ml LV SV MOD A2C:     102.1 ml LV SV MOD A4C:     98.8 ml LV SV MOD BP:      96.0 ml RIGHT VENTRICLE             IVC RV Basal diam:  4.50 cm     IVC diam: 1.80 cm RV Mid diam:    4.00 cm RV S prime:     11.60 cm/s TAPSE (M-mode): 1.8 cm LEFT ATRIUM              Index       RIGHT ATRIUM           Index LA diam:        4.80 cm  1.99 cm/m  RA Area:     31.40 cm LA Vol (A2C):   103.0 ml 42.62 ml/m RA Volume:   125.00 ml 51.73 ml/m LA Vol (A4C):   97.0 ml  40.14 ml/m LA Biplane Vol: 101.0 ml 41.79 ml/m  AORTIC VALVE AV Area (Vmax):    1.13 cm AV Area (Vmean):  1.12 cm AV Area (VTI):     1.23 cm AV Vmax:           274.00 cm/s AV Vmean:          203.000 cm/s AV VTI:            0.701  m AV Peak Grad:      30.0 mmHg AV Mean Grad:      18.0 mmHg LVOT Vmax:         81.60 cm/s LVOT Vmean:        60.000 cm/s LVOT VTI:          0.226 m LVOT/AV VTI ratio: 0.32  AORTA Ao Root diam: 3.60 cm Ao Asc diam:  4.20 cm MITRAL VALVE MV Area (PHT): 4.10 cm     SHUNTS MV Decel Time: 185 msec     Systemic VTI:  0.23 m MV E velocity: 100.00 cm/s  Systemic Diam: 2.20 cm MV A velocity: 94.30 cm/s MV E/A ratio:  1.06 Mihai Croitoru MD Electronically signed by Sanda Klein MD Signature Date/Time: 03/29/2020/11:52:31 AM    Final     Assessment and Plan:   1. Syncope - I suspect that his syncope is multifactorial. Unclear if mostly autonomic dysfunction or progressive heart block. He has significant conduction system disease. 2. AS - his AS is mild/moderate by echo and I do not think that this is playing a role. 3. Pulmonary fibrosis - he does not appear to have pulmonary hypertension      For questions or updates, please contact [place] Please consult [email or web address] for contact info under     Signed, Cristopher Peru, MD  03/29/2020 4:04 PM

## 2020-03-29 NOTE — Progress Notes (Signed)
CBG at suppertime = 58. OJ, graham crackers and supper given. Rechecked CBG = 93. Only insulin given today has been self administered by pt's home VGO. Dr Maylene Roes notified.

## 2020-03-30 ENCOUNTER — Inpatient Hospital Stay (HOSPITAL_COMMUNITY)
Admission: EM | Disposition: A | Discharge status: Home / Self Care | Payer: Self-pay | Source: Home / Self Care | Attending: Internal Medicine

## 2020-03-30 DIAGNOSIS — I442 Atrioventricular block, complete: Secondary | ICD-10-CM | POA: Diagnosis not present

## 2020-03-30 HISTORY — PX: PACEMAKER IMPLANT: EP1218

## 2020-03-30 LAB — BASIC METABOLIC PANEL
Anion gap: 6 (ref 5–15)
BUN: 20 mg/dL (ref 8–23)
CO2: 29 mmol/L (ref 22–32)
Calcium: 8.7 mg/dL — ABNORMAL LOW (ref 8.9–10.3)
Chloride: 102 mmol/L (ref 98–111)
Creatinine, Ser: 1.26 mg/dL — ABNORMAL HIGH (ref 0.61–1.24)
GFR calc Af Amer: >60 mL/min (ref >60)
GFR calc non Af Amer: 54 mL/min — ABNORMAL LOW (ref >60)
Glucose, Bld: 87 mg/dL (ref 70–99)
Potassium: 3.8 mmol/L (ref 3.5–5.1)
Sodium: 137 mmol/L (ref 135–145)

## 2020-03-30 LAB — CBC
HCT: 39.9 % (ref 39.0–52.0)
Hemoglobin: 13.5 g/dL (ref 13.0–17.0)
MCH: 30.9 pg (ref 26.0–34.0)
MCHC: 33.8 g/dL (ref 30.0–36.0)
MCV: 91.3 fL (ref 80.0–100.0)
Platelets: 208 10*3/uL (ref 150–400)
RBC: 4.37 MIL/uL (ref 4.22–5.81)
RDW: 12.8 % (ref 11.5–15.5)
WBC: 8 10*3/uL (ref 4.0–10.5)
nRBC: 0 % (ref 0.0–0.2)

## 2020-03-30 LAB — GLUCOSE, CAPILLARY
Glucose-Capillary: 96 mg/dL (ref 70–99)
Glucose-Capillary: 108 mg/dL — ABNORMAL HIGH (ref 70–99)
Glucose-Capillary: 76 mg/dL (ref 70–99)
Glucose-Capillary: 90 mg/dL (ref 70–99)

## 2020-03-30 LAB — SURGICAL PCR SCREEN
MRSA, PCR: POSITIVE — AB
Staphylococcus aureus: POSITIVE — AB

## 2020-03-30 LAB — MAGNESIUM: Magnesium: 1.7 mg/dL (ref 1.7–2.4)

## 2020-03-30 SURGERY — PACEMAKER IMPLANT

## 2020-03-30 MED ORDER — SODIUM CHLORIDE 0.9 % IV SOLN: INTRAVENOUS | Status: DC | PRN

## 2020-03-30 MED ORDER — CHLORHEXIDINE GLUCONATE 4 % EX LIQD: 60.0000 mL | Freq: Once | CUTANEOUS | Status: DC

## 2020-03-30 MED ORDER — MIDAZOLAM HCL 5 MG/5ML IJ SOLN
INTRAMUSCULAR | Status: AC
  Filled 2020-03-30: qty 5

## 2020-03-30 MED ORDER — SODIUM CHLORIDE 0.9% FLUSH
3.0000 mL | Freq: Two times a day (BID) | INTRAVENOUS | Status: DC
  Administered 2020-03-30 – 2020-03-31 (×2): 3 mL via INTRAVENOUS

## 2020-03-30 MED ORDER — LIDOCAINE HCL 1 % IJ SOLN
INTRAMUSCULAR | Status: AC
  Filled 2020-03-30: qty 20

## 2020-03-30 MED ORDER — SODIUM CHLORIDE 0.9 % IV SOLN
80.0000 mg | INTRAVENOUS | Status: AC
  Administered 2020-03-30: 80 mg
  Filled 2020-03-30: qty 2

## 2020-03-30 MED ORDER — SODIUM CHLORIDE 0.9 % IV SOLN: INTRAVENOUS | Status: DC

## 2020-03-30 MED ORDER — CHLORHEXIDINE GLUCONATE 4 % EX LIQD
60.0000 mL | Freq: Once | CUTANEOUS | Status: AC
  Administered 2020-03-30: 4 via TOPICAL

## 2020-03-30 MED ORDER — DEXTROSE 5 % IV SOLN
3.0000 g | INTRAVENOUS | Status: AC
  Administered 2020-03-30: 3 g via INTRAVENOUS
  Filled 2020-03-30 (×3): qty 3000

## 2020-03-30 MED ORDER — CHLORHEXIDINE GLUCONATE CLOTH 2 % EX PADS
6.0000 | MEDICATED_PAD | Freq: Every day | CUTANEOUS | Status: DC
  Administered 2020-03-31: 6 via TOPICAL

## 2020-03-30 MED ORDER — CEFAZOLIN SODIUM-DEXTROSE 1-4 GM/50ML-% IV SOLN
1.0000 g | Freq: Four times a day (QID) | INTRAVENOUS | Status: AC
  Administered 2020-03-30 – 2020-03-31 (×3): 1 g via INTRAVENOUS
  Filled 2020-03-30 (×3): qty 50

## 2020-03-30 MED ORDER — SODIUM CHLORIDE 0.9 % IV SOLN
INTRAVENOUS | Status: AC
  Filled 2020-03-30: qty 2

## 2020-03-30 MED ORDER — FENTANYL CITRATE (PF) 100 MCG/2ML IJ SOLN
INTRAMUSCULAR | Status: DC | PRN
  Administered 2020-03-30 (×2): 25 ug via INTRAVENOUS

## 2020-03-30 MED ORDER — MIDAZOLAM HCL 5 MG/5ML IJ SOLN
INTRAMUSCULAR | Status: DC | PRN
  Administered 2020-03-30 (×2): 2 mg via INTRAVENOUS

## 2020-03-30 MED ORDER — HEPARIN (PORCINE) IN NACL 1000-0.9 UT/500ML-% IV SOLN
INTRAVENOUS | Status: DC | PRN
  Administered 2020-03-30: 500 mL

## 2020-03-30 MED ORDER — LIDOCAINE HCL (PF) 1 % IJ SOLN
INTRAMUSCULAR | Status: DC | PRN
  Administered 2020-03-30: 45 mL

## 2020-03-30 MED ORDER — SODIUM CHLORIDE 0.9% FLUSH: 3.0000 mL | INTRAVENOUS | Status: DC | PRN

## 2020-03-30 MED ORDER — SODIUM CHLORIDE 0.9 % IV SOLN
250.0000 mL | INTRAVENOUS | Status: DC
  Administered 2020-03-30: 250 mL via INTRAVENOUS

## 2020-03-30 MED ORDER — ACETAMINOPHEN 325 MG PO TABS
325.0000 mg | ORAL_TABLET | ORAL | Status: DC | PRN
  Administered 2020-03-31: 650 mg via ORAL
  Filled 2020-03-30: qty 2

## 2020-03-30 MED ORDER — MUPIROCIN 2 % EX OINT
1.0000 "application " | TOPICAL_OINTMENT | Freq: Two times a day (BID) | CUTANEOUS | Status: DC
  Administered 2020-03-31: 1 via NASAL
  Filled 2020-03-30: qty 22

## 2020-03-30 MED ORDER — ONDANSETRON HCL 4 MG/2ML IJ SOLN
4.0000 mg | Freq: Four times a day (QID) | INTRAMUSCULAR | Status: DC | PRN
  Administered 2020-03-31: 4 mg via INTRAVENOUS
  Filled 2020-03-30: qty 2

## 2020-03-30 MED ORDER — FENTANYL CITRATE (PF) 100 MCG/2ML IJ SOLN
INTRAMUSCULAR | Status: AC
  Filled 2020-03-30: qty 2

## 2020-03-30 SURGICAL SUPPLY — 8 items
CABLE SURGICAL S-101-97-12 (CABLE) ×2 IMPLANT
HOVERMATT SINGLE USE (MISCELLANEOUS) ×1 IMPLANT
LEAD SOLIA S PRO MRI 53 (Lead) ×1 IMPLANT
LEAD SOLIA S PRO MRI 60 (Lead) ×1 IMPLANT
PACEMAKER EDORA 8DR-T MRI (Pacemaker) ×1 IMPLANT
PAD PRO RADIOLUCENT 2001M-C (PAD) ×2 IMPLANT
SHEATH 7FR PRELUDE SNAP 13 (SHEATH) ×4 IMPLANT
TRAY PACEMAKER INSERTION (PACKS) ×2 IMPLANT

## 2020-03-30 NOTE — Progress Notes (Addendum)
Progress Note  Patient Name: Shane [name] Date of Encounter: 03/30/2020  Primary Cardiologist: Kirk Ruths, MD   Subjective   Slept well, no CP, palpitations, no rest SOB  Inpatient Medications    Scheduled Meds:  vitamin C  1,000 mg Oral Daily   aspirin EC  81 mg Oral Daily   insulin aspart  0-5 Units Subcutaneous QHS   insulin aspart  0-9 Units Subcutaneous TID WC   insulin detemir  10 Units Subcutaneous QHS   losartan  50 mg Oral Daily   multivitamin with minerals  1 tablet Oral Daily   pantoprazole  40 mg Oral Daily   sodium chloride flush  3 mL Intravenous Q12H   Continuous Infusions:   PRN Meds: acetaminophen **OR** acetaminophen, albuterol, diazepam, hydrALAZINE   Vital Signs    Vitals:   03/29/20 2011 03/30/20 0004 03/30/20 0552 03/30/20 0555  BP: (!) 157/72 (!) 151/64 (!) 142/67   Pulse: (!) 56 (!) 58 62   Resp: 19 19 19    Temp: 98 F (36.7 C)  97.7 F (36.5 C)   TempSrc: Oral  Oral   SpO2: 97% 98% 99%   Weight:    122.5 kg    Intake/Output Summary (Last 24 hours) at 03/30/2020 0745 Last data filed at 03/30/2020 0555 Gross per 24 hour  Intake 220 ml  Output 2300 ml  Net -2080 ml   Last 3 Weights 03/30/2020 03/13/2020 03/12/2020  Weight (lbs) 270 lb 1 oz 269 lb 268 lb  Weight (kg) 122.5 kg 122.018 kg 121.564 kg      Telemetry    ST, mobitz one with brief episodes 2:1 conduction more so overnight, early AM - Personally Reviewed  ECG    No new EKGs - Personally Reviewed  Physical Exam   GEN: No acute distress.   Neck: No JVD Cardiac: RRR, no murmurs, rubs, or gallops.  Respiratory: CTA b/l. GI: Soft, nontender, non-distended  MS: No edema; No deformity. Neuro:  Nonfocal  Psych: Normal affect   Labs    High Sensitivity Troponin:  No results for input(s): TROPONINIHS in the last 720 hours.    Chemistry Recent Labs  Lab 03/28/20 1221 03/29/20 0447 03/30/20 0339  NA 137 137 137  K 4.9 3.7 3.8  CL 107 102 102  CO2 23 25 29     GLUCOSE 223* 86 87  BUN 28* 24* 20  CREATININE 1.40* 1.42* 1.26*  CALCIUM 8.4* 8.6* 8.7*  GFRNONAA 48* 47* 54*  GFRAA 55* 54* >60  ANIONGAP 7 10 6      Hematology Recent Labs  Lab 03/28/20 1221 03/29/20 0447 03/30/20 0339  WBC 7.0 8.0 8.0  RBC 4.51 4.34 4.37  HGB 14.0 13.4 13.5  HCT 42.2 39.4 39.9  MCV 93.6 90.8 91.3  MCH 31.0 30.9 30.9  MCHC 33.2 34.0 33.8  RDW 13.2 12.9 12.8  PLT 192 197 208    BNPNo results for input(s): BNP, PROBNP in the last 168 hours.   DDimer No results for input(s): DDIMER in the last 168 hours.   Radiology    CT Head Wo Contrast Result Date: 03/28/2020 CLINICAL DATA:  Bloody diarrhea, syncope, encephalopathy EXAM: CT HEAD WITHOUT CONTRAST TECHNIQUE: Contiguous axial images were obtained from the base of the skull through the vertex without intravenous contrast. COMPARISON:  Prior CT head 08/17/2019 FINDINGS: Brain: No evidence of acute infarction, hemorrhage, hydrocephalus, extra-axial collection or mass lesion/mass effect. Mild cerebral cortical atrophy. Vascular: No hyperdense vessel or unexpected calcification. Skull:  Normal. Negative for fracture or focal lesion. Sinuses/Orbits: Minimal mucoperiosteal thickening. No active sinusitis. Orbits are unremarkable. Other: None. IMPRESSION: 1. No acute intracranial abnormality. Electronically Signed   By: Jacqulynn Cadet M.D.   On: 03/28/2020 13:51   DG Chest Port 1 View Result Date: 03/28/2020 CLINICAL DATA:  Syncope.  Shortness of breath. EXAM: PORTABLE CHEST 1 VIEW COMPARISON:  February 25, 2018 FINDINGS: The cardiomediastinal silhouette is stable. The lungs are unchanged since May of 2019. Opacities are consistent with known pulmonary fibrosis. No new infiltrate. No interval changes. IMPRESSION: Known pulmonary fibrosis.  No acute interval changes. Electronically Signed   By: Dorise Bullion III M.D   On: 03/28/2020 13:20    Cardiac Studies   03/29/2020: TTE IMPRESSIONS  1. Left ventricular  ejection fraction, by estimation, is 60 to 65%. The  left ventricle has normal function. The left ventricle has no regional  wall motion abnormalities. There is mild concentric left ventricular  hypertrophy. Diastolic function  assessment is limited by second degree AV block, Mobitz type 1, but  annular velocities are in normal range.   2. Right ventricular systolic function is normal. The right ventricular  size is normal. Tricuspid regurgitation signal is inadequate for assessing  PA pressure.   3. Left atrial size was moderately dilated.   4. The mitral valve is normal in structure. Mild mitral valve  regurgitation.   5. The aortic valve is tricuspid. Aortic valve regurgitation is not  visualized. Mild aortic valve stenosis.   6. Aortic dilatation noted. 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   Comparison(s): No significant change from prior study. Prior images  reviewed side by side.   Patient Profile     79 y.o. male  nonobs CAD, Mobitz I, HTN, HL,  mild-moderate aortic stenosis, CKD III, DMII, pulmonary fibrosis, OSA, and obesity  Admitted for syncope  Assessment & Plan     1. Syncope 2. Conduction system disease     RBBB, 1st degree AVBlock, mobitz I, and LAFB     Typically has plenty of warning with minutes of feeling weak, has had a couple times that was abrupt  His telemetry last 24 hours looks largely like it had the previous SR 70's with mobitz I, mostly with only non conducted beats, intermittently with brief 2:1 for 2 beats with transient rates 30 (high 20's), over night about 0100 4beats of 2:1 conduction He mentioned the nurse came and woke him a few times last night for slow rates No symptoms of presyncope or syncope since here  He reports his biggest limitations are his DOE and back;/neck pain  Difficult, since most of his events seem vagally mediated with nausea, pallor, sweats associated and minutes of warning, no clear trigger,  though mostly is standing, he recalls perhaps once was seated watching TV He has significant conduction system disease noted.  I have discussed the case with Dr. Lovena Le.  We will proceed with PPM implant this afternoon. I have discussed with the patient and his wife at bedside.  They report Dr. Lovena Le discussed the procedure potential risks/benefots and have no follow up questions are agreeable to proceed. I discussed that a component of some of his syncope is not felty HR related and do not expect the PPM to solve his syncope entirely Will plan to use Biotronk (CLS) device in effort to reduce this as much as possible  For questions or updates, please contact [facility] Please consult [email or web address] for  contact info under   Signed, Baldwin Jamaica, PA-C  03/30/2020, 7:45 AM    EP Attending  Patient seen and examined. Agree with the findings as noted above. The paitent was stable overnight. I have discussed the situation as he has both AV conduction system disease and at least 2 syncope episodes where he had no warning as well as other episodes where he felt poorly for several minutes before passing out. I have reviewed the indications/risks/benefits/goals/expectations of PPM insertion and he wishes to proceed.  Mikle Bosworth.D.

## 2020-03-30 NOTE — Progress Notes (Signed)
PROGRESS NOTE    DAKING WESTERVELT  UUV:[id number] DOB: 1941/01/10 DOA: 03/28/2020 PCP: Burnard Bunting, MD     Brief Narrative:  Shane [name] is a 79 y.o. male with medical history significant of sick sinus syndrome, second-degree AV block, moderate aortic stenosis, IDDM, hypertension with labile blood pressure, chronic diastolic CHF, idiopathic pulmonary fibrosis, CKD stage II, RBBB, presented with near syncope.  Patient had a similar episode 2 years ago and has been following with his cardiologist.  According to the patient, he was on beta-blocker before which was discontinued after finding of episode of bradycardia.  This morning, while sitting on the toilet, of sudden, patient developed palpitation and lightheadedness; wife found patient was unresponsive, not answering any questions but remains his conscious.  Patient described as he was able to understand everything his wife was saying but he could not talk.  Wife called 22, EMS arrived and found patient bradycardia with heart rate in the 47Q and systolic blood pressures 25Z.  Was given 800 mL of saline and 1 dose of Zofran, symptoms improved.  Patient also reported he passed stools with drips of blood this morning during the episode. He was found to have second-degree AV block and admitted to the hospital. Cardiology consulted.   New events last 24 hours / Subjective: Some nausea this morning but no other presyncopal episodes, chest pain, shortness of breath at rest.  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 (HCC)   Chronic kidney disease (CKD), stage III (moderate)   Near syncope with second-degree AV block -Appreciate EP consultation, planning for pacemaker  placement  Mild to moderate aortic stenosis -Echocardiogram showed EF 60 to 65%, normal LV function, no regional wall motion abnormalities, mild aortic valve stenosis, no significant change from prior study  Essential hypertension -Continue losartan   Insulin-dependent diabetes mellitus, well controlled -Hemoglobin A1c 7.6 -Levemir, sliding scale insulin  CKD stage IIIb  -Baseline creatinine 1.3-1.5 -Stable continue to monitor  Interstitial pulmonary fibrosis -Follows with pulmonology outpatient, Dr. Chase Caller  Hematochezia -FOBT positive -Hemoglobin stable 13.5 without any further episodes of hematochezia -Patient sees [facility], Dr. Henrene Pastor.  Follow-up as an outpatient    DVT prophylaxis: SCD Code Status: Full code Family Communication: Discussed with family at bedside Disposition Plan:   Status is: Inpatient  Remains inpatient appropriate because:Hemodynamically unstable, Ongoing diagnostic testing needed not appropriate for outpatient work up and Inpatient level of care appropriate due to severity of illness   Dispo: The patient is from: Home              Anticipated d/c is to: Home              Anticipated d/c date is: 1 day              Patient currently is not medically stable to d/c.  Pacemaker planned during this admission    Consultants:   Cardiology  EP  Procedures:   None  Antimicrobials:  Anti-infectives (From admission, onward)   Start     Dose/Rate Route Frequency Ordered Stop   03/30/20 1015  gentamicin (GARAMYCIN) 80 mg in sodium chloride 0.9 % 500 mL irrigation     80 mg Irrigation On call 03/30/20 1007  03/31/20 1015   03/30/20 1015  ceFAZolin (ANCEF) 3 g in dextrose 5 % 50 mL IVPB     3 g 100 mL/hr over 30 Minutes Intravenous On call 03/30/20 1007 03/31/20 1015       Objective: Vitals:   03/29/20 2011 03/30/20 0004 03/30/20 0552 03/30/20 0555  BP: (!) 157/72 (!) 151/64 (!) 142/67   Pulse: (!) 56 (!) 58 62   Resp: 19 19 19      Temp: 98 F (36.7 C)  97.7 F (36.5 C)   TempSrc: Oral  Oral   SpO2: 97% 98% 99%   Weight:    122.5 kg    Intake/Output Summary (Last 24 hours) at 03/30/2020 1045 Last data filed at 03/30/2020 0555 Gross per 24 hour  Intake 220 ml  Output 2300 ml  Net -2080 ml   Filed Weights   03/30/20 0555  Weight: 122.5 kg    Examination: General exam: Appears calm and comfortable  Respiratory system: Clear to auscultation. Respiratory effort normal.  On room air Cardiovascular system: S1 & S2 heard, irregular rhythm, AV block on telemetry, rate 50s. No pedal edema. Gastrointestinal system: Abdomen is nondistended, soft and nontender. Normal bowel sounds heard. Central nervous system: Alert and oriented. Non focal exam. Speech clear  Extremities: Symmetric in appearance bilaterally  Skin: No rashes, lesions or ulcers on exposed skin  Psychiatry: Judgement and insight appear stable. Mood & affect appropriate.   Data Reviewed: I have personally reviewed following labs and imaging studies  CBC: Recent Labs  Lab 03/28/20 1221 03/29/20 0447 03/30/20 0339  WBC 7.0 8.0 8.0  NEUTROABS 4.3  --   --   HGB 14.0 13.4 13.5  HCT 42.2 39.4 39.9  MCV 93.6 90.8 91.3  PLT 192 197 250   Basic Metabolic Panel: Recent Labs  Lab 03/28/20 1221 03/28/20 1448 03/29/20 0447 03/30/20 0339  NA 137  --  137 137  K 4.9  --  3.7 3.8  CL 107  --  102 102  CO2 23  --  25 29  GLUCOSE 223*  --  86 87  BUN 28*  --  24* 20  CREATININE 1.40*  --  1.42* 1.26*  CALCIUM 8.4*  --  8.6* 8.7*  MG  --  1.6* 1.4* 1.7   GFR: Estimated Creatinine Clearance: 65.3 mL/min (A) (by C-G formula based on SCr of 1.26 mg/dL (H)). Liver Function Tests: No results for input(s): AST, ALT, ALKPHOS, BILITOT, PROT, ALBUMIN in the last 168 hours. No results for input(s): LIPASE, AMYLASE in the last 168 hours. No results for input(s): AMMONIA in the last 168 hours. Coagulation Profile: Recent Labs  Lab 03/28/20 1221  03/29/20 0447  INR 1.1 1.0   Cardiac Enzymes: No results for input(s): CKTOTAL, CKMB, CKMBINDEX, TROPONINI in the last 168 hours. BNP (last 3 results) No results for input(s): PROBNP in the last 8760 hours. HbA1C: Recent Labs    03/28/20 1448  HGBA1C 7.6*   CBG: Recent Labs  Lab 03/29/20 1153 03/29/20 1638 03/29/20 1723 03/29/20 2110 03/30/20 0753  GLUCAP 174* 58* 93 104* 96   Lipid Profile: No results for input(s): CHOL, HDL, LDLCALC, TRIG, CHOLHDL, LDLDIRECT in the last 72 hours. Thyroid Function Tests: Recent Labs    03/28/20 1448  TSH 3.258   Anemia Panel: No results for input(s): VITAMINB12, FOLATE, FERRITIN, TIBC, IRON, RETICCTPCT in the last 72 hours. Sepsis Labs: No results for input(s): PROCALCITON, LATICACIDVEN in the last 168 hours.  Recent Results (from  the past 240 hour(s))  Respiratory Panel by RT PCR (Flu A&B, Covid) - Nasopharyngeal Swab     Status: None   Collection Time: 03/28/20  3:36 PM   Specimen: Nasopharyngeal Swab  Result Value Ref Range Status   SARS Coronavirus 2 by RT PCR NEGATIVE NEGATIVE Final    Comment: (NOTE) SARS-CoV-2 target nucleic acids are NOT DETECTED. The SARS-CoV-2 RNA is generally detectable in upper respiratoy specimens during the acute phase of infection. The lowest concentration of SARS-CoV-2 viral copies this assay can detect is 131 copies/mL. A negative result does not preclude SARS-Cov-2 infection and should not be used as the sole basis for treatment or other patient management decisions. A negative result may occur with  improper specimen collection/handling, submission of specimen other than nasopharyngeal swab, presence of viral mutation(s) within the areas targeted by this assay, and inadequate number of viral copies (<131 copies/mL). A negative result must be combined with clinical observations, patient history, and epidemiological information. The expected result is Negative. Fact Sheet for Patients:   PinkCheek.be Fact Sheet for Healthcare Providers:  GravelBags.it This test is not yet ap proved or cleared by the [place] FDA and  has been authorized for detection and/or diagnosis of SARS-CoV-2 by FDA under an Emergency Use Authorization (EUA). This EUA will remain  in effect (meaning this test can be used) for the duration of the COVID-19 declaration under Section 564(b)(1) of the Act, 21 U.S.C. section 360bbb-3(b)(1), unless the authorization is terminated or revoked sooner.    Influenza A by PCR NEGATIVE NEGATIVE Final   Influenza B by PCR NEGATIVE NEGATIVE Final    Comment: (NOTE) The Xpert Xpress SARS-CoV-2/FLU/RSV assay is intended as an aid in  the diagnosis of influenza from Nasopharyngeal swab specimens and  should not be used as a sole basis for treatment. Nasal washings and  aspirates are unacceptable for Xpert Xpress SARS-CoV-2/FLU/RSV  testing. Fact Sheet for Patients: PinkCheek.be Fact Sheet for Healthcare Providers: GravelBags.it This test is not yet approved or cleared by the [place] FDA and  has been authorized for detection and/or diagnosis of SARS-CoV-2 by  FDA under an Emergency Use Authorization (EUA). This EUA will remain  in effect (meaning this test can be used) for the duration of the  Covid-19 declaration under Section 564(b)(1) of the Act, 21  U.S.C. section 360bbb-3(b)(1), unless the authorization is  terminated or revoked. Performed at [facility], [place] [address], [place], [place] [address]       Radiology Studies: CT Head Wo Contrast  Result Date: 03/28/2020 CLINICAL DATA:  Bloody diarrhea, syncope, encephalopathy EXAM: CT HEAD WITHOUT CONTRAST TECHNIQUE: Contiguous axial images were obtained from the base of the skull through the vertex without intravenous contrast. COMPARISON:  Prior CT head 08/17/2019 FINDINGS:  Brain: No evidence of acute infarction, hemorrhage, hydrocephalus, extra-axial collection or mass lesion/mass effect. Mild cerebral cortical atrophy. Vascular: No hyperdense vessel or unexpected calcification. Skull: Normal. Negative for fracture or focal lesion. Sinuses/Orbits: Minimal mucoperiosteal thickening. No active sinusitis. Orbits are unremarkable. Other: None. IMPRESSION: 1. No acute intracranial abnormality. Electronically Signed   By: Jacqulynn Cadet M.D.   On: 03/28/2020 13:51   DG Chest Port 1 View  Result Date: 03/28/2020 CLINICAL DATA:  Syncope.  Shortness of breath. EXAM: PORTABLE CHEST 1 VIEW COMPARISON:  February 25, 2018 FINDINGS: The cardiomediastinal silhouette is stable. The lungs are unchanged since May of 2019. Opacities are consistent with known pulmonary fibrosis. No new infiltrate. No interval changes. IMPRESSION:  Known pulmonary fibrosis.  No acute interval changes. Electronically Signed   By: Dorise Bullion III M.D   On: 03/28/2020 13:20   ECHOCARDIOGRAM COMPLETE  Result Date: 03/29/2020    ECHOCARDIOGRAM REPORT   Patient Name:   Shane [name] Date of Exam: 03/29/2020 Medical Rec #:  [id number]     Height:       72.0 in Accession #:    [id number]    Weight:       269.0 lb Date of Birth:  1941/07/04     BSA:          2.417 m Patient Age:    6 years      BP:           140/64 mmHg Patient Gender: M             HR:           44 bpm. Exam Location:  Inpatient Procedure: 2D Echo, Cardiac Doppler and Color Doppler Indications:    R55 Syncope  History:        Patient has prior history of Echocardiogram examinations, most                 recent 10/16/2019.  Sonographer:    Merrie Roof RDCS Referring Phys: [id number] [place]  1. Left ventricular ejection fraction, by estimation, is 60 to 65%. The left ventricle has normal function. The left ventricle has no regional wall motion abnormalities. There is mild concentric left ventricular hypertrophy. Diastolic function  assessment is limited by second degree AV block, Mobitz type 1, but annular velocities are in normal range.  2. Right ventricular systolic function is normal. The right ventricular size is normal. Tricuspid regurgitation signal is inadequate for assessing PA pressure.  3. Left atrial size was moderately dilated.  4. The mitral valve is normal in structure. Mild mitral valve regurgitation.  5. The aortic valve is tricuspid. Aortic valve regurgitation is not visualized. Mild aortic valve stenosis.  6. Aortic dilatation noted. 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 Comparison(s): No significant change from prior study. Prior images reviewed side by side. FINDINGS  Left Ventricle: Left ventricular ejection fraction, by estimation, is 60 to 65%. The left ventricle has normal function. The left ventricle has no regional wall motion abnormalities. The left ventricular internal cavity size was normal in size. There is  mild concentric left ventricular hypertrophy. Diastolic function assessment is limited by second degree AV block, Mobitz type 1, but annular velocities are in normal range. Right Ventricle: The right ventricular size is normal. No increase in right ventricular wall thickness. Right ventricular systolic function is normal. Tricuspid regurgitation signal is inadequate for assessing PA pressure. Left Atrium: Left atrial size was moderately dilated. Right Atrium: Right atrial size was normal in size. Pericardium: There is no evidence of pericardial effusion. Mitral Valve: The mitral valve is normal in structure. There is mild thickening of the mitral valve leaflet(s). Mild mitral valve regurgitation. Tricuspid Valve: The tricuspid valve is normal in structure. Tricuspid valve regurgitation is not demonstrated. Aortic Valve: The aortic valve is tricuspid. . There is moderate thickening and mild calcification of the aortic valve. Aortic valve regurgitation is not visualized. Mild aortic  stenosis is present. There is moderate thickening of the aortic valve. There  is mild calcification of the aortic valve. Aortic valve mean gradient measures 18.0 mmHg. Aortic valve peak gradient measures 30.0 mmHg. Aortic valve area, by VTI measures 1.23  cm. Pulmonic Valve: The pulmonic valve was normal in structure. Pulmonic valve regurgitation is not visualized. Aorta: Aortic dilatation noted. 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 IAS/Shunts: No atrial level shunt detected by color flow Doppler.  LEFT VENTRICLE PLAX 2D LVIDd:         5.00 cm LVIDs:         3.30 cm LV PW:         1.30 cm LV IVS:        1.40 cm LVOT diam:     2.20 cm LV SV:         86 LV SV Index:   36 LVOT Area:     3.80 cm  LV Volumes (MOD) LV vol d, MOD A2C: 133.0 ml LV vol d, MOD A4C: 98.8 ml LV vol s, MOD A2C: 30.9 ml LV vol s, MOD A4C: 14.1 ml LV SV MOD A2C:     102.1 ml LV SV MOD A4C:     98.8 ml LV SV MOD BP:      96.0 ml RIGHT VENTRICLE             IVC RV Basal diam:  4.50 cm     IVC diam: 1.80 cm RV Mid diam:    4.00 cm RV S prime:     11.60 cm/s TAPSE (M-mode): 1.8 cm LEFT ATRIUM              Index       RIGHT ATRIUM           Index LA diam:        4.80 cm  1.99 cm/m  RA Area:     31.40 cm LA Vol (A2C):   103.0 ml 42.62 ml/m RA Volume:   125.00 ml 51.73 ml/m LA Vol (A4C):   97.0 ml  40.14 ml/m LA Biplane Vol: 101.0 ml 41.79 ml/m  AORTIC VALVE AV Area (Vmax):    1.13 cm AV Area (Vmean):   1.12 cm AV Area (VTI):     1.23 cm AV Vmax:           274.00 cm/s AV Vmean:          203.000 cm/s AV VTI:            0.701 m AV Peak Grad:      30.0 mmHg AV Mean Grad:      18.0 mmHg LVOT Vmax:         81.60 cm/s LVOT Vmean:        60.000 cm/s LVOT VTI:          0.226 m LVOT/AV VTI ratio: 0.32  AORTA Ao Root diam: 3.60 cm Ao Asc diam:  4.20 cm MITRAL VALVE MV Area (PHT): 4.10 cm     SHUNTS MV Decel Time: 185 msec     Systemic VTI:  0.23 m MV E velocity: 100.00 cm/s  Systemic Diam: 2.20 cm MV A velocity: 94.30 cm/s MV  E/A ratio:  1.06 Mihai Croitoru MD Electronically signed by Sanda Klein MD Signature Date/Time: 03/29/2020/11:52:31 AM    Final       Scheduled Meds: . vitamin C  1,000 mg Oral Daily  . aspirin EC  81 mg Oral Daily  . chlorhexidine  60 mL Topical Once  . chlorhexidine  60 mL Topical Once  . gentamicin irrigation  80 mg Irrigation On Call  . insulin aspart  0-5 Units Subcutaneous QHS  . insulin aspart  0-9 Units Subcutaneous TID WC  . insulin detemir  10 Units Subcutaneous QHS  . losartan  50 mg Oral Daily  . multivitamin with minerals  1 tablet Oral Daily  . pantoprazole  40 mg Oral Daily  . sodium chloride flush  3 mL Intravenous Q12H  . sodium chloride flush  3 mL Intravenous Q12H   Continuous Infusions: . sodium chloride    . sodium chloride    .  ceFAZolin (ANCEF) IV       LOS: 2 days      Time spent: 35 minutes   Dessa Phi, DO [facility] 03/30/2020, 10:45 AM   Available via Epic secure chat 7am-7pm After these hours, please refer to coverage provider listed on [email or web address]

## 2020-03-30 NOTE — Discharge Instructions (Signed)
    Supplemental Discharge Instructions for  Pacemaker/Defibrillator Patients  Activity No heavy lifting or vigorous activity with your left/right arm for 6 to 8 weeks.  Do not raise your left/right arm above your head for one week.  Gradually raise your affected arm as drawn below.             04/03/2020                04/04/2020                   04/05/2020                 04/06/2020 __  NO DRIVING until cleared to by your doctor  WOUND CARE - Keep the wound area clean and dry.  Do not get this area wet for one week. No showers for one week; you may shower on 04/06/2020  . - The tape/steri-strips on your wound will fall off; do not pull them off.  No bandage is needed on the site.  DO  NOT apply any creams, oils, or ointments to the wound area. - If you notice any drainage or discharge from the wound, any swelling or bruising at the site, or you develop a fever > 101? F after you are discharged home, call the office at once.  Special Instructions - You are still able to use cellular telephones; use the ear opposite the side where you have your pacemaker/defibrillator.  Avoid carrying your cellular phone near your device. - When traveling through airports, show security personnel your identification card to avoid being screened in the metal detectors.  Ask the security personnel to use the hand wand. - Avoid arc welding equipment, MRI testing (magnetic resonance imaging), TENS units (transcutaneous nerve stimulators).  Call the office for questions about other devices. - Avoid electrical appliances that are in poor condition or are not properly grounded. - Microwave ovens are safe to be near or to operate.

## 2020-03-31 ENCOUNTER — Inpatient Hospital Stay (HOSPITAL_COMMUNITY): Payer: Medicare Other

## 2020-03-31 ENCOUNTER — Other Ambulatory Visit: Payer: Self-pay

## 2020-03-31 DIAGNOSIS — I459 Conduction disorder, unspecified: Secondary | ICD-10-CM | POA: Diagnosis not present

## 2020-03-31 LAB — CBC
HCT: 39 % (ref 39.0–52.0)
Hemoglobin: 13.3 g/dL (ref 13.0–17.0)
MCH: 30.8 pg (ref 26.0–34.0)
MCHC: 34.1 g/dL (ref 30.0–36.0)
MCV: 90.3 fL (ref 80.0–100.0)
Platelets: 194 10*3/uL (ref 150–400)
RBC: 4.32 MIL/uL (ref 4.22–5.81)
RDW: 12.7 % (ref 11.5–15.5)
WBC: 7.3 10*3/uL (ref 4.0–10.5)
nRBC: 0 % (ref 0.0–0.2)

## 2020-03-31 LAB — BASIC METABOLIC PANEL
Anion gap: 8 (ref 5–15)
BUN: 18 mg/dL (ref 8–23)
CO2: 28 mmol/L (ref 22–32)
Calcium: 8.6 mg/dL — ABNORMAL LOW (ref 8.9–10.3)
Chloride: 99 mmol/L (ref 98–111)
Creatinine, Ser: 1.19 mg/dL (ref 0.61–1.24)
GFR calc Af Amer: >60 mL/min (ref >60)
GFR calc non Af Amer: 58 mL/min — ABNORMAL LOW (ref >60)
Glucose, Bld: 125 mg/dL — ABNORMAL HIGH (ref 70–99)
Potassium: 4.1 mmol/L (ref 3.5–5.1)
Sodium: 135 mmol/L (ref 135–145)

## 2020-03-31 LAB — GLUCOSE, CAPILLARY: Glucose-Capillary: 105 mg/dL — ABNORMAL HIGH (ref 70–99)

## 2020-03-31 LAB — MAGNESIUM: Magnesium: 1.6 mg/dL — ABNORMAL LOW (ref 1.7–2.4)

## 2020-03-31 MED ORDER — MAGNESIUM SULFATE 2 GM/50ML IV SOLN
2.0000 g | Freq: Once | INTRAVENOUS | Status: AC
  Administered 2020-03-31: 2 g via INTRAVENOUS
  Filled 2020-03-31: qty 50

## 2020-03-31 MED ORDER — YOU HAVE A PACEMAKER BOOK
Freq: Once | Status: AC
  Filled 2020-03-31: qty 1

## 2020-03-31 MED FILL — Lidocaine HCl Local Inj 1%: INTRAMUSCULAR | Qty: 60 | Status: AC

## 2020-03-31 NOTE — Progress Notes (Addendum)
Progress Note  Patient Name: Shane [name] Date of Encounter: 03/31/2020  Primary Cardiologist: Kirk Ruths, MD   Subjective   Slept well, no CP, palpitations, no rest SOB, minimal discomfort at pacer site  Inpatient Medications    Scheduled Meds: . vitamin C  1,000 mg Oral Daily  . aspirin EC  81 mg Oral Daily  . Chlorhexidine Gluconate Cloth  6 each Topical Q0600  . insulin aspart  0-5 Units Subcutaneous QHS  . insulin aspart  0-9 Units Subcutaneous TID WC  . insulin detemir  10 Units Subcutaneous QHS  . losartan  50 mg Oral Daily  . multivitamin with minerals  1 tablet Oral Daily  . mupirocin ointment  1 application Nasal BID  . pantoprazole  40 mg Oral Daily  . sodium chloride flush  3 mL Intravenous Q12H  . sodium chloride flush  3 mL Intravenous Q12H   Continuous Infusions: . magnesium sulfate bolus IVPB 2 g (03/31/20 0846)   PRN Meds: acetaminophen, albuterol, diazepam, hydrALAZINE, ondansetron (ZOFRAN) IV   Vital Signs    Vitals:   03/31/20 0400 03/31/20 0425 03/31/20 0629 03/31/20 0850  BP:   (!) 147/60 (!) 131/52  Pulse: (!) 49  (!) 57 60  Resp:    18  Temp:   97.6 F (36.4 C)   TempSrc:   Oral   SpO2: 99%  94%   Weight:  123.7 kg    Height:    6' (1.829 m)    Intake/Output Summary (Last 24 hours) at 03/31/2020 0852 Last data filed at 03/31/2020 6720 Gross per 24 hour  Intake 207.98 ml  Output 1900 ml  Net -1692.02 ml   Last 3 Weights 03/31/2020 03/30/2020 03/13/2020  Weight (lbs) 272 lb 9.6 oz 270 lb 1 oz 269 lb  Weight (kg) 123.651 kg 122.5 kg 122.018 kg      Telemetry    SR/VP, AV pacing - Personally Reviewed  ECG    AV paced - Personally Reviewed  Physical Exam   GEN: No acute distress.   Neck: No JVD Cardiac: RRR, no murmurs, rubs, or gallops.  Respiratory: CTA b/l. GI: Soft, nontender, non-distended  MS: No edema; No deformity. Neuro:  Nonfocal  Psych: Normal affect   PPM site: stable, dry, no bleeding or hematoma  Labs      High Sensitivity Troponin:  No results for input(s): TROPONINIHS in the last 720 hours.    Chemistry Recent Labs  Lab 03/29/20 0447 03/30/20 0339 03/31/20 0359  NA 137 137 135  K 3.7 3.8 4.1  CL 102 102 99  CO2 25 29 28   GLUCOSE 86 87 125*  BUN 24* 20 18  CREATININE 1.42* 1.26* 1.19  CALCIUM 8.6* 8.7* 8.6*  GFRNONAA 47* 54* 58*  GFRAA 54* >60 >60  ANIONGAP 10 6 8      Hematology Recent Labs  Lab 03/29/20 0447 03/30/20 0339 03/31/20 0359  WBC 8.0 8.0 7.3  RBC 4.34 4.37 4.32  HGB 13.4 13.5 13.3  HCT 39.4 39.9 39.0  MCV 90.8 91.3 90.3  MCH 30.9 30.9 30.8  MCHC 34.0 33.8 34.1  RDW 12.9 12.8 12.7  PLT 197 208 194    BNPNo results for input(s): BNP, PROBNP in the last 168 hours.   DDimer No results for input(s): DDIMER in the last 168 hours.   Radiology    CT Head Wo Contrast Result Date: 03/28/2020 CLINICAL DATA:  Bloody diarrhea, syncope, encephalopathy EXAM: CT HEAD WITHOUT CONTRAST TECHNIQUE: Contiguous axial  images were obtained from the base of the skull through the vertex without intravenous contrast. COMPARISON:  Prior CT head 08/17/2019 FINDINGS: Brain: No evidence of acute infarction, hemorrhage, hydrocephalus, extra-axial collection or mass lesion/mass effect. Mild cerebral cortical atrophy. Vascular: No hyperdense vessel or unexpected calcification. Skull: Normal. Negative for fracture or focal lesion. Sinuses/Orbits: Minimal mucoperiosteal thickening. No active sinusitis. Orbits are unremarkable. Other: None. IMPRESSION: 1. No acute intracranial abnormality. Electronically Signed   By: Jacqulynn Cadet M.D.   On: 03/28/2020 13:51   DG Chest Port 1 View Result Date: 03/28/2020 CLINICAL DATA:  Syncope.  Shortness of breath. EXAM: PORTABLE CHEST 1 VIEW COMPARISON:  February 25, 2018 FINDINGS: The cardiomediastinal silhouette is stable. The lungs are unchanged since May of 2019. Opacities are consistent with known pulmonary fibrosis. No new infiltrate. No interval  changes. IMPRESSION: Known pulmonary fibrosis.  No acute interval changes. Electronically Signed   By: Dorise Bullion III M.D   On: 03/28/2020 13:20    Cardiac Studies   03/29/2020: TTE IMPRESSIONS  1. Left ventricular ejection fraction, by estimation, is 60 to 65%. The  left ventricle has normal function. The left ventricle has no regional  wall motion abnormalities. There is mild concentric left ventricular  hypertrophy. Diastolic function  assessment is limited by second degree AV block, Mobitz type 1, but  annular velocities are in normal range.   2. Right ventricular systolic function is normal. The right ventricular  size is normal. Tricuspid regurgitation signal is inadequate for assessing  PA pressure.   3. Left atrial size was moderately dilated.   4. The mitral valve is normal in structure. Mild mitral valve  regurgitation.   5. The aortic valve is tricuspid. Aortic valve regurgitation is not  visualized. Mild aortic valve stenosis.   6. Aortic dilatation noted. 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   Comparison(s): No significant change from prior study. Prior images  reviewed side by side.   Patient Profile     79 y.o. male  nonobs CAD, Mobitz I, HTN, HL,  mild-moderate aortic stenosis, CKD III, DMII, pulmonary fibrosis, OSA, and obesity  Admitted for syncope  Assessment & Plan     1. Syncope 2. Conduction system disease     RBBB, 1st degree AVBlock, mobitz I, and LAFB     Typically has plenty of warning with minutes of feeling weak, has had a couple times that was abrupt  S/p PPM yesterday with Dr. Lovena Le Wound is stable Device check this AM with stable measurements CXR this Am without ptx Wound care and activity instructions were discussed with the patient and his wife at bedside Routine post pacing follow up is in place  Discussed no driving until cleared by MD at follow up given his syncope felt to be multifactorial and has  occurred in seated position  OK for discharge form EP perspective when ready medically otherwise    For questions or updates, please contact [facility] Please consult [email or web address] for contact info under   Signed, Baldwin Jamaica, PA-C  03/31/2020, 8:52 AM    EP Attending  Patient seen and examined. Agree with the findings as noted above. The patient has undergone DDD PM insertion and appears to be doing well. His incision is without hematoma, PPM interrogation carried out under my direction demonstrates normal DDD PM function and CXR looks good with no PTX. He can be discharged home with usual follow up as noted above.   Carleene Overlie  Gilmar Bua,M.D.

## 2020-03-31 NOTE — Discharge Summary (Signed)
Physician Discharge Summary  Shane [name] NIO:[id number] DOB: 1941/09/01 DOA: 03/28/2020  PCP: Burnard Bunting, MD  Admit date: 03/28/2020 Discharge date: 03/31/2020  Admitted From: Home Disposition:  Home  Recommendations for Outpatient Follow-up:  1. Follow up with PCP in 1 week 2. Follow up with Cardiology as scheduled  3. Recommended outpatient GI follow up in 1-2 months regarding hematochezia   Discharge Condition: Stable CODE STATUS: Full  Diet recommendation: Heart healthy   Brief/Interim Summary: Shane [name] a 79 y.o.malewith medical history significant ofsick sinus syndrome, second-degree AV block, moderate aortic stenosis, IDDM, hypertension with labile blood pressure, chronic diastolic CHF, idiopathic pulmonary fibrosis, CKD stage II,RBBB, presented with near syncope. Patient had a similar episode 2 years ago and has been following with his cardiologist. According to the patient, he was on beta-blocker before which was discontinued after finding of episode of bradycardia. This morning, while sitting on the toilet, of sudden, patient developed palpitation and lightheadedness; wife found patient was unresponsive, not answering any questions but remains his conscious. Patient described as he was able to understand everything his wife was saying but he could not talk. Wife called 10, EMS arrived and found patient bradycardia with heart rate in the 81W and systolic blood pressures 29H. Was given 800 mL of saline and 1 dose of Zofran, symptoms improved. Patient also reported he passed stools with drips of blood this morning during the episode. He was found to have second-degree AV block and admitted to the hospital. Cardiology consulted.  Patient underwent permanent pacemaker placement on 4/26.  Hemoglobin remained stable without further episodes of hematochezia during hospitalization.  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 (HCC)   Chronic kidney disease (CKD), stage III (moderate)   Near syncope with second-degree AV block -Appreciate EP consultation, status post permanent pacemaker placement 4/26  Mild to moderate aortic stenosis -Echocardiogram showed EF 60 to 65%, normal LV function, no regional wall motion abnormalities, mild aortic valve stenosis, no significant change from prior study  Essential hypertension -Continue losartan   Insulin-dependent diabetes mellitus, well controlled -Hemoglobin A1c 7.6 -Levemir, sliding scale insulin  CKD stage IIIb  -Baseline creatinine 1.3-1.5 -Stable continue to monitor  Interstitial pulmonary fibrosis -Follows with pulmonology outpatient, Dr. Chase Caller  Hematochezia -FOBT positive -Hemoglobin stable 13.3 without any further episodes of hematochezia -Patient sees [facility], Dr. Henrene Pastor.  Follow-up as an outpatient  Hypomagnesemia -Replace, trend     Discharge Instructions  Discharge Instructions    Call MD for:  difficulty breathing, headache or visual disturbances   Complete by: As directed    Call MD for:  extreme fatigue   Complete by: As directed    Call MD for:  persistant dizziness or light-headedness   Complete by: As directed    Call MD for:  persistant nausea and vomiting   Complete by: As directed    Call MD for:  redness, tenderness, or signs of infection (pain, swelling, redness, odor or green/yellow discharge around incision site)   Complete by: As directed    Call MD for:  severe uncontrolled pain   Complete by: As directed    Call MD for:  temperature >100.4   Complete by: As directed    Diet -  low sodium heart healthy   Complete by: As directed    Discharge instructions   Complete by:  As directed    You were cared for by a hospitalist during your hospital stay. If you have any questions about your discharge medications or the care you received while you were in the hospital after you are discharged, you can call the unit and ask to speak with the hospitalist on call if the hospitalist that took care of you is not available. Once you are discharged, your primary care physician will handle any further medical issues. Please note that NO REFILLS for any discharge medications will be authorized once you are discharged, as it is imperative that you return to your primary care physician (or establish a relationship with a primary care physician if you do not have one) for your aftercare needs so that they can reassess your need for medications and monitor your lab values.   Driving Restrictions   Complete by: As directed    No driving until cleared by your outpatient physicians   Increase activity slowly   Complete by: As directed      Allergies as of 03/31/2020      Reactions   Codeine Hives, Itching   Other Diarrhea, Other (See Comments)   Esbriet causes elevated LFTs. D/C on 06/14/17 and SEVERE DIARRHEA   Ofev [nintedanib] Diarrhea   SEVERE DIARRHEA      Medication List    TAKE these medications   aspirin EC 81 MG tablet Take 81 mg by mouth daily.   clotrimazole-betamethasone cream Commonly known as: LOTRISONE Apply 1 application topically daily as needed (for skin infection).   EPINEPHrine 0.3 mg/0.3 mL Soaj injection Commonly known as: EPI-PEN Inject 0.3 mg into the muscle once as needed for anaphylaxis.   FreeStyle Libre 14 Day Sensor Misc 1 patch every 14 (fourteen) days.   furosemide 40 MG tablet Commonly known as: LASIX Take 20 mg by mouth daily.   HumaLOG 100 UNIT/ML injection Generic drug: insulin lispro Inject 40 Units into the skin See admin instructions. Use as directed with V-Go 40 insulin pump   insulin detemir 100 UNIT/ML injection Commonly  known as: LEVEMIR Inject 12 Units into the skin at bedtime.   LORazepam 1 MG tablet Commonly known as: ATIVAN Take 1 mg by mouth at bedtime as needed for sleep.   losartan 50 MG tablet Commonly known as: COZAAR Take 50 mg by mouth daily.   multivitamin with minerals Tabs tablet Take 1 tablet by mouth daily.   pantoprazole 40 MG tablet Commonly known as: PROTONIX Take 40 mg by mouth daily.   potassium chloride SA 20 MEQ tablet Commonly known as: KLOR-CON Take 20 mEq by mouth 2 (two) times daily.   ProAir HFA 108 (90 Base) MCG/ACT inhaler Generic drug: albuterol Inhale 2 puffs into the lungs every 4 (four) hours as needed for wheezing or shortness of breath.   vitamin C 1000 MG tablet Take 1,000 mg by mouth daily.   Zaditor 0.025 % ophthalmic solution Generic drug: ketotifen Place 1 drop into both eyes 2 (two) times daily as needed (pollen allergies).      Follow-up Information    [facility] Follow up.   Specialty: Cardiology Why: 04/09/2020 @ 3:00PM, wound check visit Contact information: [address], [address] [place]       Evans Lance, MD Follow up.   Specialty: Cardiology Why: 06/30/2020 @ 2:45PM Contact  information: [address] [place] [address] [phone]        Burnard Bunting, MD. Schedule an appointment as soon as possible for a visit in 1 week(s).   Specialty: Internal Medicine Contact information: [place] [address] [phone]        Irene Shipper, MD .   Specialty: Gastroenterology Contact information: [place]. [place] [address] [phone]          Allergies  Allergen Reactions  . Codeine Hives and Itching  . Other Diarrhea and Other (See Comments)    Esbriet causes elevated LFTs. D/C on 06/14/17 and SEVERE DIARRHEA  . Ofev [Nintedanib] Diarrhea    SEVERE DIARRHEA     Consultations:  Cardiology  EP    Procedures/Studies: DG Chest 2 View  Result Date: 03/31/2020 CLINICAL DATA:  Pacemaker placement EXAM: CHEST - 2 VIEW COMPARISON:  Three days ago FINDINGS: Dual-chamber pacer leads from the left overlapping the right atrial appendage and ventricular right apex. Low volume chest with coarse interstitial opacities correlating with fibrosis by 2020 chest CT. This and mediastinal fat accentuates cardiac size. No visible pneumothorax or pleural effusion. Postoperative cervical spine IMPRESSION: 1. Pacer implant without complicating feature. 2. Pulmonary fibrosis. Electronically Signed   By: Monte Fantasia M.D.   On: 03/31/2020 07:57   CT Head Wo Contrast  Result Date: 03/28/2020 CLINICAL DATA:  Bloody diarrhea, syncope, encephalopathy EXAM: CT HEAD WITHOUT CONTRAST TECHNIQUE: Contiguous axial images were obtained from the base of the skull through the vertex without intravenous contrast. COMPARISON:  Prior CT head 08/17/2019 FINDINGS: Brain: No evidence of acute infarction, hemorrhage, hydrocephalus, extra-axial collection or mass lesion/mass effect. Mild cerebral cortical atrophy. Vascular: No hyperdense vessel or unexpected calcification. Skull: Normal. Negative for fracture or focal lesion. Sinuses/Orbits: Minimal mucoperiosteal thickening. No active sinusitis. Orbits are unremarkable. Other: None. IMPRESSION: 1. No acute intracranial abnormality. Electronically Signed   By: Jacqulynn Cadet M.D.   On: 03/28/2020 13:51   EP PPM/ICD IMPLANT  Result Date: 03/30/2020 CONCLUSIONS:  1. Successful implantation of a Biotronik dual-chamber pacemaker for symptomatic bradycardia due to 2:1 AV block  2. No early apparent complications.       Cristopher Peru, MD 03/30/2020 3:46 PM   DG Chest Port 1 View  Result Date: 03/28/2020 CLINICAL DATA:  Syncope.  Shortness of breath. EXAM: PORTABLE CHEST 1 VIEW COMPARISON:  February 25, 2018 FINDINGS: The cardiomediastinal silhouette  is stable. The lungs are unchanged since May of 2019. Opacities are consistent with known pulmonary fibrosis. No new infiltrate. No interval changes. IMPRESSION: Known pulmonary fibrosis.  No acute interval changes. Electronically Signed   By: Dorise Bullion III M.D   On: 03/28/2020 13:20   ECHOCARDIOGRAM COMPLETE  Result Date: 03/29/2020    ECHOCARDIOGRAM REPORT   Patient Name:   KIET GEER Date of Exam: 03/29/2020 Medical Rec #:  [id number]     Height:       72.0 in Accession #:    [id number]    Weight:       269.0 lb Date of Birth:  02-14-1941     BSA:          2.417 m Patient Age:    66 years      BP:           140/64 mmHg Patient Gender: M             HR:  44 bpm. Exam Location:  Inpatient Procedure: 2D Echo, Cardiac Doppler and Color Doppler Indications:    R55 Syncope  History:        Patient has prior history of Echocardiogram examinations, most                 recent 10/16/2019.  Sonographer:    Merrie Roof RDCS Referring Phys: [id number] [place]  1. Left ventricular ejection fraction, by estimation, is 60 to 65%. The left ventricle has normal function. The left ventricle has no regional wall motion abnormalities. There is mild concentric left ventricular hypertrophy. Diastolic function assessment is limited by second degree AV block, Mobitz type 1, but annular velocities are in normal range.  2. Right ventricular systolic function is normal. The right ventricular size is normal. Tricuspid regurgitation signal is inadequate for assessing PA pressure.  3. Left atrial size was moderately dilated.  4. The mitral valve is normal in structure. Mild mitral valve regurgitation.  5. The aortic valve is tricuspid. Aortic valve regurgitation is not visualized. Mild aortic valve stenosis.  6. Aortic dilatation noted. 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 Comparison(s): No significant change from prior study. Prior images reviewed side by side. FINDINGS  Left  Ventricle: Left ventricular ejection fraction, by estimation, is 60 to 65%. The left ventricle has normal function. The left ventricle has no regional wall motion abnormalities. The left ventricular internal cavity size was normal in size. There is  mild concentric left ventricular hypertrophy. Diastolic function assessment is limited by second degree AV block, Mobitz type 1, but annular velocities are in normal range. Right Ventricle: The right ventricular size is normal. No increase in right ventricular wall thickness. Right ventricular systolic function is normal. Tricuspid regurgitation signal is inadequate for assessing PA pressure. Left Atrium: Left atrial size was moderately dilated. Right Atrium: Right atrial size was normal in size. Pericardium: There is no evidence of pericardial effusion. Mitral Valve: The mitral valve is normal in structure. There is mild thickening of the mitral valve leaflet(s). Mild mitral valve regurgitation. Tricuspid Valve: The tricuspid valve is normal in structure. Tricuspid valve regurgitation is not demonstrated. Aortic Valve: The aortic valve is tricuspid. . There is moderate thickening and mild calcification of the aortic valve. Aortic valve regurgitation is not visualized. Mild aortic stenosis is present. There is moderate thickening of the aortic valve. There  is mild calcification of the aortic valve. Aortic valve mean gradient measures 18.0 mmHg. Aortic valve peak gradient measures 30.0 mmHg. Aortic valve area, by VTI measures 1.23 cm. Pulmonic Valve: The pulmonic valve was normal in structure. Pulmonic valve regurgitation is not visualized. Aorta: Aortic dilatation noted. 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 IAS/Shunts: No atrial level shunt detected by color flow Doppler.  LEFT VENTRICLE PLAX 2D LVIDd:         5.00 cm LVIDs:         3.30 cm LV PW:         1.30 cm LV IVS:        1.40 cm LVOT diam:     2.20 cm LV SV:         86 LV SV Index:    36 LVOT Area:     3.80 cm  LV Volumes (MOD) LV vol d, MOD A2C: 133.0 ml LV vol d, MOD A4C: 98.8 ml LV vol s, MOD A2C: 30.9 ml LV vol s, MOD A4C: 14.1 ml LV  SV MOD A2C:     102.1 ml LV SV MOD A4C:     98.8 ml LV SV MOD BP:      96.0 ml RIGHT VENTRICLE             IVC RV Basal diam:  4.50 cm     IVC diam: 1.80 cm RV Mid diam:    4.00 cm RV S prime:     11.60 cm/s TAPSE (M-mode): 1.8 cm LEFT ATRIUM              Index       RIGHT ATRIUM           Index LA diam:        4.80 cm  1.99 cm/m  RA Area:     31.40 cm LA Vol (A2C):   103.0 ml 42.62 ml/m RA Volume:   125.00 ml 51.73 ml/m LA Vol (A4C):   97.0 ml  40.14 ml/m LA Biplane Vol: 101.0 ml 41.79 ml/m  AORTIC VALVE AV Area (Vmax):    1.13 cm AV Area (Vmean):   1.12 cm AV Area (VTI):     1.23 cm AV Vmax:           274.00 cm/s AV Vmean:          203.000 cm/s AV VTI:            0.701 m AV Peak Grad:      30.0 mmHg AV Mean Grad:      18.0 mmHg LVOT Vmax:         81.60 cm/s LVOT Vmean:        60.000 cm/s LVOT VTI:          0.226 m LVOT/AV VTI ratio: 0.32  AORTA Ao Root diam: 3.60 cm Ao Asc diam:  4.20 cm MITRAL VALVE MV Area (PHT): 4.10 cm     SHUNTS MV Decel Time: 185 msec     Systemic VTI:  0.23 m MV E velocity: 100.00 cm/s  Systemic Diam: 2.20 cm MV A velocity: 94.30 cm/s MV E/A ratio:  1.06 Mihai Croitoru MD Electronically signed by Sanda Klein MD Signature Date/Time: 03/29/2020/11:52:31 AM    Final    OCT, Retina - OU - Both Eyes  Result Date: 03/16/2020 Right Eye Quality was good. Scan locations included subfoveal. Progression has been stable. Findings include outer retinal atrophy, central retinal atrophy, macular pucker, retinal drusen . Left Eye Quality was good. Scan locations included subfoveal. Progression has been stable. Findings include outer retinal atrophy, central retinal atrophy, retinal drusen . Notes NO ACTIVE CSME,,  OU,,,    epiretinal membrane OD is nasal to the fovea and not visually significant  Color Fundus Photography Optos - OU -  Both Eyes  Result Date: 03/16/2020 Right Eye Progression has been stable. Disc findings include normal observations. Macula : normal observations. Periphery : normal observations. Left Eye Disc findings include normal observations. Macula : normal observations, flat. Periphery : normal observations. Notes OD with good PRP 360 degrees some room nasally. No active CS ME OS with good PRP 360, with clear media.  PDR quiet OU      Discharge Exam: Vitals:   03/31/20 0629 03/31/20 0850  BP: (!) 147/60 (!) 131/52  Pulse: (!) 57 60  Resp:  18  Temp: 97.6 F (36.4 C)   SpO2: 94%     General: Pt is alert, awake, not in acute distress Cardiovascular: RRR, S1/S2 +, no edema Respiratory: CTA bilaterally, no  wheezing, no rhonchi, no respiratory distress, no conversational dyspnea  Abdominal: Soft, NT, ND, bowel sounds + Extremities: no edema, no cyanosis Psych: Normal mood and affect, stable judgement and insight     The results of significant diagnostics from this hospitalization (including imaging, microbiology, ancillary and laboratory) are listed below for reference.     Microbiology: Recent Results (from the past 240 hour(s))  Respiratory Panel by RT PCR (Flu A&B, Covid) - Nasopharyngeal Swab     Status: None   Collection Time: 03/28/20  3:36 PM   Specimen: Nasopharyngeal Swab  Result Value Ref Range Status   SARS Coronavirus 2 by RT PCR NEGATIVE NEGATIVE Final    Comment: (NOTE) SARS-CoV-2 target nucleic acids are NOT DETECTED. The SARS-CoV-2 RNA is generally detectable in upper respiratoy specimens during the acute phase of infection. The lowest concentration of SARS-CoV-2 viral copies this assay can detect is 131 copies/mL. A negative result does not preclude SARS-Cov-2 infection and should not be used as the sole basis for treatment or other patient management decisions. A negative result may occur with  improper specimen collection/handling, submission of specimen other than  nasopharyngeal swab, presence of viral mutation(s) within the areas targeted by this assay, and inadequate number of viral copies (<131 copies/mL). A negative result must be combined with clinical observations, patient history, and epidemiological information. The expected result is Negative. Fact Sheet for Patients:  PinkCheek.be Fact Sheet for Healthcare Providers:  GravelBags.it This test is not yet ap proved or cleared by the [place] FDA and  has been authorized for detection and/or diagnosis of SARS-CoV-2 by FDA under an Emergency Use Authorization (EUA). This EUA will remain  in effect (meaning this test can be used) for the duration of the COVID-19 declaration under Section 564(b)(1) of the Act, 21 U.S.C. section 360bbb-3(b)(1), unless the authorization is terminated or revoked sooner.    Influenza A by PCR NEGATIVE NEGATIVE Final   Influenza B by PCR NEGATIVE NEGATIVE Final    Comment: (NOTE) The Xpert Xpress SARS-CoV-2/FLU/RSV assay is intended as an aid in  the diagnosis of influenza from Nasopharyngeal swab specimens and  should not be used as a sole basis for treatment. Nasal washings and  aspirates are unacceptable for Xpert Xpress SARS-CoV-2/FLU/RSV  testing. Fact Sheet for Patients: PinkCheek.be Fact Sheet for Healthcare Providers: GravelBags.it This test is not yet approved or cleared by the [place] FDA and  has been authorized for detection and/or diagnosis of SARS-CoV-2 by  FDA under an Emergency Use Authorization (EUA). This EUA will remain  in effect (meaning this test can be used) for the duration of the  Covid-19 declaration under Section 564(b)(1) of the Act, 21  U.S.C. section 360bbb-3(b)(1), unless the authorization is  terminated or revoked. Performed at [facility], [place] [address] [place], [place] [address]   Surgical  PCR screen     Status: Abnormal   Collection Time: 03/30/20 11:20 AM   Specimen: Nasal Mucosa; Nasal Swab  Result Value Ref Range Status   MRSA, PCR POSITIVE (A) NEGATIVE Final    Comment: RESULT CALLED TO, READ BACK BY AND VERIFIED WITH: Awilda Metro RN 13:45 03/30/20 ([email or web address])    Staphylococcus aureus POSITIVE (A) NEGATIVE Final    Comment: (NOTE) The Xpert SA Assay (FDA approved for NASAL specimens in patients 73 years of age and older), is one component of a comprehensive surveillance program. It is not intended to diagnose infection nor to guide or monitor treatment. Performed at [facility]  [facility], [place] [address] [place], [place] [address]      Labs: BNP (last 3 results) No results for input(s): BNP in the last 8760 hours. Basic Metabolic Panel: Recent Labs  Lab 03/28/20 1221 03/28/20 1448 03/29/20 0447 03/30/20 0339 03/31/20 0359  NA 137  --  137 137 135  K 4.9  --  3.7 3.8 4.1  CL 107  --  102 102 99  CO2 23  --  25 29 28   GLUCOSE 223*  --  86 87 125*  BUN 28*  --  24* 20 18  CREATININE 1.40*  --  1.42* 1.26* 1.19  CALCIUM 8.4*  --  8.6* 8.7* 8.6*  MG  --  1.6* 1.4* 1.7 1.6*   Liver Function Tests: No results for input(s): AST, ALT, ALKPHOS, BILITOT, PROT, ALBUMIN in the last 168 hours. No results for input(s): LIPASE, AMYLASE in the last 168 hours. No results for input(s): AMMONIA in the last 168 hours. CBC: Recent Labs  Lab 03/28/20 1221 03/29/20 0447 03/30/20 0339 03/31/20 0359  WBC 7.0 8.0 8.0 7.3  NEUTROABS 4.3  --   --   --   HGB 14.0 13.4 13.5 13.3  HCT 42.2 39.4 39.9 39.0  MCV 93.6 90.8 91.3 90.3  PLT 192 197 208 194   Cardiac Enzymes: No results for input(s): CKTOTAL, CKMB, CKMBINDEX, TROPONINI in the last 168 hours. BNP: Invalid input(s): POCBNP CBG: Recent Labs  Lab 03/30/20 0753 03/30/20 1629 03/30/20 2055 03/30/20 2134 03/31/20 0642  GLUCAP 96 108* 76 90 105*   D-Dimer No results for input(s): DDIMER in the last 72  hours. Hgb A1c Recent Labs    03/28/20 1448  HGBA1C 7.6*   Lipid Profile No results for input(s): CHOL, HDL, LDLCALC, TRIG, CHOLHDL, LDLDIRECT in the last 72 hours. Thyroid function studies Recent Labs    03/28/20 1448  TSH 3.258   Anemia work up No results for input(s): VITAMINB12, FOLATE, FERRITIN, TIBC, IRON, RETICCTPCT in the last 72 hours. Urinalysis    Component Value Date/Time   COLORURINE YELLOW 03/28/2020 1345   APPEARANCEUR CLEAR 03/28/2020 1345   LABSPEC 1.009 03/28/2020 1345   PHURINE 5.0 03/28/2020 1345   GLUCOSEU NEGATIVE 03/28/2020 1345   HGBUR NEGATIVE 03/28/2020 1345   BILIRUBINUR NEGATIVE 03/28/2020 1345   KETONESUR NEGATIVE 03/28/2020 1345   PROTEINUR 100 (A) 03/28/2020 1345   UROBILINOGEN 1.0 09/27/2011 1112   NITRITE NEGATIVE 03/28/2020 1345   LEUKOCYTESUR NEGATIVE 03/28/2020 1345   Sepsis Labs Invalid input(s): PROCALCITONIN,  WBC,  LACTICIDVEN Microbiology Recent Results (from the past 240 hour(s))  Respiratory Panel by RT PCR (Flu A&B, Covid) - Nasopharyngeal Swab     Status: None   Collection Time: 03/28/20  3:36 PM   Specimen: Nasopharyngeal Swab  Result Value Ref Range Status   SARS Coronavirus 2 by RT PCR NEGATIVE NEGATIVE Final    Comment: (NOTE) SARS-CoV-2 target nucleic acids are NOT DETECTED. The SARS-CoV-2 RNA is generally detectable in upper respiratoy specimens during the acute phase of infection. The lowest concentration of SARS-CoV-2 viral copies this assay can detect is 131 copies/mL. A negative result does not preclude SARS-Cov-2 infection and should not be used as the sole basis for treatment or other patient management decisions. A negative result may occur with  improper specimen collection/handling, submission of specimen other than nasopharyngeal swab, presence of viral mutation(s) within the areas targeted by this assay, and inadequate number of viral copies (<131 copies/mL). A negative result must be combined  with  clinical observations, patient history, and epidemiological information. The expected result is Negative. Fact Sheet for Patients:  PinkCheek.be Fact Sheet for Healthcare Providers:  GravelBags.it This test is not yet ap proved or cleared by the [place] FDA and  has been authorized for detection and/or diagnosis of SARS-CoV-2 by FDA under an Emergency Use Authorization (EUA). This EUA will remain  in effect (meaning this test can be used) for the duration of the COVID-19 declaration under Section 564(b)(1) of the Act, 21 U.S.C. section 360bbb-3(b)(1), unless the authorization is terminated or revoked sooner.    Influenza A by PCR NEGATIVE NEGATIVE Final   Influenza B by PCR NEGATIVE NEGATIVE Final    Comment: (NOTE) The Xpert Xpress SARS-CoV-2/FLU/RSV assay is intended as an aid in  the diagnosis of influenza from Nasopharyngeal swab specimens and  should not be used as a sole basis for treatment. Nasal washings and  aspirates are unacceptable for Xpert Xpress SARS-CoV-2/FLU/RSV  testing. Fact Sheet for Patients: PinkCheek.be Fact Sheet for Healthcare Providers: GravelBags.it This test is not yet approved or cleared by the [place] FDA and  has been authorized for detection and/or diagnosis of SARS-CoV-2 by  FDA under an Emergency Use Authorization (EUA). This EUA will remain  in effect (meaning this test can be used) for the duration of the  Covid-19 declaration under Section 564(b)(1) of the Act, 21  U.S.C. section 360bbb-3(b)(1), unless the authorization is  terminated or revoked. Performed at [facility], [place] [address], [place], [place] [address]   Surgical PCR screen     Status: Abnormal   Collection Time: 03/30/20 11:20 AM   Specimen: Nasal Mucosa; Nasal Swab  Result Value Ref Range Status   MRSA, PCR POSITIVE (A) NEGATIVE Final     Comment: RESULT CALLED TO, READ BACK BY AND VERIFIED WITH: Awilda Metro RN 13:45 03/30/20 ([email or web address])    Staphylococcus aureus POSITIVE (A) NEGATIVE Final    Comment: (NOTE) The Xpert SA Assay (FDA approved for NASAL specimens in patients 32 years of age and older), is one component of a comprehensive surveillance program. It is not intended to diagnose infection nor to guide or monitor treatment. Performed at [facility], [place] [address], [place], [place] [address]      Patient was seen and examined on the day of discharge and was found to be in stable condition. Time coordinating discharge: 25 minutes including assessment and coordination of care, as well as examination of the patient.   SIGNED:  Dessa Phi, DO [facility] 03/31/2020, 10:17 AM

## 2020-03-31 NOTE — Evaluation (Signed)
Physical Therapy Evaluation Patient Details Name: Shane [name] MRN: [id number] DOB: Jul 13, 1941 Today's Date: 03/31/2020   History of Present Illness  Pt is 79 yo male with PMH including sick sinus syndrome, second-degree AV block, moderate aortic stenosis, IDDM, hypertension with labile blood pressure, chronic diastolic CHF, idiopathic pulmonary fibrosis, CKD stage II, RBBB who presented to ED with near syncope.  He was found to have 2nd degree AV block and underwent permanent pacemaker placement on 4/26.  Clinical Impression  Pt admitted with above diagnosis. Pt was able to demonstrate transfers and gait with mod I to supervision level.  He initially needed cues/education for transfer techniques with pacemaker precautions but then was able to perform teach back and demonstrate safe techniques.  He does present with functional limitations due to the deficits listed below (see PT Problem List) that will benefit from PT while hospitalized, but if discharging does have safe mobility to return home with family.   .      Follow Up Recommendations No PT follow up    Equipment Recommendations  None recommended by PT    Recommendations for Other Services       Precautions / Restrictions Precautions Precautions: ICD/Pacemaker Restrictions Weight Bearing Restrictions: Yes LUE Weight Bearing: Non weight bearing      Mobility  Bed Mobility Overal bed mobility: Modified Independent                Transfers Overall transfer level: Needs assistance   Transfers: Sit to/from Stand Sit to Stand: Modified independent (Device/Increase time);Min guard         General transfer comment: min guard progressed to mod I;  performed x 3;  pt cued for safe L UE placement with pacemaker precautions (worked best for pt to place L hand on knee with sitting and standing and push with R UE)  Ambulation/Gait Ambulation/Gait assistance: Supervision Gait Distance (Feet): 80 Feet Assistive device:  Rolling walker (2 wheeled) Gait Pattern/deviations: Decreased dorsiflexion - right;Decreased weight shift to right;Decreased stance time - right     General Gait Details: Pt tends to supinate R ankle and with decreased dorsiflexion - however, reports this is his baseline and has tried braces in the past that did not help.  Able to ambulate 24' with RW without LOB and DOE 2/4.  On RA with sats 94%  Stairs Stairs: (Pt verbalized up with good and down with bad; demonstrates strength necessary for stairs and has rails)          Wheelchair Mobility    Modified Rankin (Stroke Patients Only)       Balance Overall balance assessment: Needs assistance   Sitting balance-Leahy Scale: Normal     Standing balance support: Single extremity supported Standing balance-Leahy Scale: Good                               Pertinent Vitals/Pain Pain Assessment: No/denies pain    Home Living Family/patient expects to be discharged to:: Private residence Living Arrangements: Spouse/significant other Available Help at Discharge: Family;Available 24 hours/day Type of Home: House Home Access: Stairs to enter Entrance Stairs-Rails: Right;Left;Can reach both Entrance Stairs-Number of Steps: 6 Home Layout: Two level;Able to live on main level with bedroom/bathroom Home Equipment: Shower seat - built in;Cane - single point;Walker - 2 wheels;Grab bars - toilet;Grab bars - tub/shower;Agricultural engineer / Transfers Assistance Needed: Reports limited  due to resp comorbidities/DOE and major neck/back surgeries with deformed foot.  Can ambulate in house- "furniture surfs"; Can move from room to room most of the time without a rest break.  Uses electric scooter when outside "piddling in garage"  ADL's / Homemaking Assistance Needed: Performs ADLs independenlty - dons compressions socks with sock aide;  wife assist with IADLs;  drives        Hand Dominance         Extremity/Trunk Assessment   Upper Extremity Assessment Upper Extremity Assessment: LUE deficits/detail;RUE deficits/detail RUE Deficits / Details: WNL LUE Deficits / Details: limited due to pacemeaker precautions    Lower Extremity Assessment Lower Extremity Assessment: RLE deficits/detail;LLE deficits/detail RLE Deficits / Details: ROM:  knee and hip WFL, ankle tends to supinate and with limited dorsiflexion (baseline).  MMT: 4-/5 knee and hip, ankle 2/5 LLE Deficits / Details: ROM WFL; MMT 5/5    Cervical / Trunk Assessment Cervical / Trunk Assessment: Normal  Communication   Communication: No difficulties  Cognition Arousal/Alertness: Awake/alert Behavior During Therapy: WFL for tasks assessed/performed Overall Cognitive Status: Within Functional Limits for tasks assessed                                        General Comments General comments (skin integrity, edema, etc.): Pt educated on pacemaker precautions including precautions during transfers , walking, and dressing    Exercises     Assessment/Plan    PT Assessment Patient needs continued PT services  PT Problem List Decreased strength;Decreased mobility;Decreased range of motion;Decreased knowledge of precautions;Decreased activity tolerance;Cardiopulmonary status limiting activity;Decreased balance;Decreased knowledge of use of DME       PT Treatment Interventions DME instruction;Therapeutic activities;Gait training;Therapeutic exercise;Patient/family education;Stair training;Balance training;Functional mobility training    PT Goals (Current goals can be found in the Care Plan section)  Acute Rehab PT Goals Patient Stated Goal: return home PT Goal Formulation: With patient/family Time For Goal Achievement: 04/14/20 Potential to Achieve Goals: Good    Frequency Min 3X/week   Barriers to discharge        Co-evaluation               AM-PAC PT "6 Clicks" Mobility  Outcome  Measure Help needed turning from your back to your side while in a flat bed without using bedrails?: None Help needed moving from lying on your back to sitting on the side of a flat bed without using bedrails?: None Help needed moving to and from a bed to a chair (including a wheelchair)?: None Help needed standing up from a chair using your arms (e.g., wheelchair or bedside chair)?: None Help needed to walk in hospital room?: None Help needed climbing 3-5 steps with a railing? : A Little 6 Click Score: 23    End of Session Equipment Utilized During Treatment: Gait belt Activity Tolerance: Patient tolerated treatment well Patient left: in bed;with call bell/phone within reach;with family/visitor present;with nursing/sitter in room Nurse Communication: Mobility status PT Visit Diagnosis: Other abnormalities of gait and mobility (R26.89)    Time: 1010-1035 PT Time Calculation (min) (ACUTE ONLY): 25 min   Charges:   PT Evaluation $PT Eval Moderate Complexity: 1 Mod PT Treatments $Therapeutic Activity: 8-22 mins        Maggie Font, PT Acute Rehab Services Pager [phone] [facility] [phone] [facility] 03/31/2020, 10:58 AM

## 2020-04-01 ENCOUNTER — Telehealth: Payer: Self-pay | Admitting: Internal Medicine

## 2020-04-01 NOTE — Telephone Encounter (Signed)
  Shane [name]  He is my office patient with IPF. He was admitted for syncope and heart block. Mag was low and he got replacement but not at discharge I just ofund out   Plan  - mag oxide 400mg  daily - send prescription  - 30 day with 3 refills  Thanks  MR   Recent Labs  Lab 03/28/20 1221 03/28/20 1221 03/28/20 1448 03/29/20 0447 03/29/20 0447 03/30/20 0339 03/31/20 0359  NA 137  --   --  137  --  137 135  K 4.9   < >  --  3.7   < > 3.8 4.1  CL 107  --   --  102  --  102 99  CO2 23  --   --  25  --  29 28  GLUCOSE 223*  --   --  86  --  87 125*  BUN 28*  --   --  24*  --  20 18  CREATININE 1.40*  --   --  1.42*  --  1.26* 1.19  CALCIUM 8.4*  --   --  8.6*  --  8.7* 8.6*  MG  --   --  1.6* 1.4*  --  1.7 1.6*   < > = values in this interval not displayed.

## 2020-04-02 MED ORDER — MAGNESIUM OXIDE 400 MG PO CAPS: 400.0000 mg | ORAL_CAPSULE | Freq: Every day | ORAL | 3 refills | Status: DC

## 2020-04-02 NOTE — Telephone Encounter (Signed)
Called and spoke with pt's wife Shane [name] letting her know that MR wanted pt to begin mag oxide daily due to low mag level after recent hospital stay. She verbalized understanding. Verified pt's preferred pharmacy and sent rx to pharmacy. Nothing further needed.

## 2020-04-03 ENCOUNTER — Other Ambulatory Visit: Payer: Self-pay

## 2020-04-03 ENCOUNTER — Ambulatory Visit (HOSPITAL_COMMUNITY)
Admission: RE | Admit: 2020-04-03 | Discharge: 2020-04-03 | Disposition: A | Discharge status: Home / Self Care | Payer: Medicare Other | Source: Ambulatory Visit | Attending: Internal Medicine | Admitting: Internal Medicine

## 2020-04-03 DIAGNOSIS — Z45018 Encounter for adjustment and management of other part of cardiac pacemaker: Secondary | ICD-10-CM | POA: Diagnosis not present

## 2020-04-03 DIAGNOSIS — I499 Cardiac arrhythmia, unspecified: Secondary | ICD-10-CM

## 2020-04-03 DIAGNOSIS — I471 Supraventricular tachycardia: Secondary | ICD-10-CM | POA: Insufficient documentation

## 2020-04-03 DIAGNOSIS — J8410 Pulmonary fibrosis, unspecified: Secondary | ICD-10-CM | POA: Diagnosis not present

## 2020-04-03 DIAGNOSIS — I491 Atrial premature depolarization: Secondary | ICD-10-CM | POA: Diagnosis not present

## 2020-04-03 DIAGNOSIS — Z006 Encounter for examination for normal comparison and control in clinical research program: Secondary | ICD-10-CM | POA: Insufficient documentation

## 2020-04-03 MED ORDER — STUDY - FIBROGEN - PAMREVLUMAB 10 MG/ML IV INFUSION (OPEN LABEL) (PI-RAMASWAMY)
30.0000 mg/kg | Freq: Once | INTRAVENOUS | Status: AC
  Administered 2020-04-03: 3710 mg via INTRAVENOUS
  Filled 2020-04-03: qty 371

## 2020-04-03 MED ORDER — METOPROLOL SUCCINATE ER 25 MG PO TB24
25.0000 mg | ORAL_TABLET | Freq: Every day | ORAL | 11 refills | Status: DC
Start: 2020-04-03 — End: 2020-07-08

## 2020-04-03 NOTE — Progress Notes (Signed)
The patient was the in the infusion center today for infusion of medication for pulmonary fibrosis. Shortly after this was completed he developed arrhythmia on the monitor, that was noticed by his nurse and the patient felt slightly unwell  (unfortunately the portable bedside monitor was not capable of saving or printing the abnormality). Performed a full interrogation of his pacemaker that shows normal device function, but there are extremely frequent premature atrial contractions and brief runs of paroxysmal atrial tachycardia.  Has mostly atrial sensed, ventricular paced rhythm but also periods of AV sequential pacing.  There was no evidence of pacemaker mediated tachycardia or atrial fibrillation or any ventricular arrhythmia.  Generator and lead parameters are all within normal range. Will start treatment with a low-dose of beta-blocker to help suppress the atrial arrhythmia.  He has a follow-up visit scheduled with Dr. Lovena Le next week.  Sanda Klein, MD, [facility] [phone] office [phone] pager

## 2020-04-03 NOTE — Research (Signed)
Title: [id number] Open-Label Extension (OLE) is a multi-center, single-arm, open-label extension (OLE) phase where subjects who complete the Week 48 visit of the main study are eligible to participate. The extension to the main study allows for the continued evaluation of the efficacy and safety of 30 mg/kg IV infusions of pamrevlumab administered every 3 weeks for 52 weeks in subjects with Idiopathic Pulmonary Fibrosis.  Primary endpoint is: To provide continued access to pamrevlumab in subjects with IPF who completed the Week 48 visit of the main study  Protocol #: [id number], Clinical Trials #: [id number] Sponsor: [email or web address]  ([place], [place], [place])   Scientist, physiological / Electrical engineer note : This visit for Subject Shane [name] with DOB: 10/10/1941 on 04/03/2020 for the above protocol is Visit/Encounter # Week 21  and is for purpose of research.   Subject expressed continued interest and consent in continuing as a study subject. Subject confirmed that there was no change in contact information (e.g. address, telephone, email). Subject thanked for participation in research and contribution to science.   Pre Infusion procedures and assessments completed per the above mentioned protocol. Infusion given over 30 minutes per protocol. The infusion ended at 10:18. At 10:27 the subjects vital signs were obtained and it was noted on the monitor that  the heart rate was jumping from 74 bpm to 30bpm and back. This coordinator then checked the subjects pulse manually and it was noted that the heart rate was not in normal rhythm. During this same time the subject stated he felt "woozy". Subjects glucose was checked at 10:31 and it was 150. This coordinator then called the PI, Dr. Chase Caller and advised of the events. Dr. Chase Caller ordered a 12 lead ECG to be performed. At the time the ECG was placed and report run, the subject stated the "woozy" feeling had subsided and with manual pulse  check as well as electronic pulse check, rhythm appeared to have returned to normal. The total event lasted approximately 10-15 minutes in length.   Following the completion of the ECG Dr. Chase Caller consulted with Dr.Croitoru, cardiology, regarding the event. Dr. Sallyanne Kuster requested for the patient to wait for a pacemaker download to be completed and reviewed before leaving the hospital.  Following consultation with Dr. Sallyanne Kuster and his review of pacemaker download, he decided to add metoprolol succinate 25mg  once daily to the subjects medication regimen. The subject was advised that prescription was sent to pharmacy. Please refer to Dr. Victorino December note regarding the event.   AE determined by PI: Atrial Arrythmias, refer to PI note for further details.   The subjects research source binder has been updated with all of the above information. Please refer to the source binder for further details of the visit.     Signed by  Celina Bing, BS, CMA, [place] Coordinator [facility]  [place], [place] 11:13 AM 04/03/2020

## 2020-04-07 ENCOUNTER — Ambulatory Visit: Payer: Medicare Other | Admitting: Internal Medicine

## 2020-04-07 ENCOUNTER — Encounter: Payer: Self-pay | Admitting: Internal Medicine

## 2020-04-07 ENCOUNTER — Other Ambulatory Visit: Payer: Self-pay

## 2020-04-07 VITALS — BP 158/60 | HR 75 | Temp 98.2°F | Ht 72.0 in | Wt 269.6 lb

## 2020-04-07 DIAGNOSIS — Z006 Encounter for examination for normal comparison and control in clinical research program: Secondary | ICD-10-CM

## 2020-04-07 DIAGNOSIS — J84112 Idiopathic pulmonary fibrosis: Secondary | ICD-10-CM

## 2020-04-07 NOTE — Addendum Note (Signed)
Addended by: Jannette Spanner on: 04/07/2020 05:09 PM   Modules accepted: Orders

## 2020-04-07 NOTE — Patient Instructions (Signed)
ICD-10-CM   1. 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[place])  J84.112   2. Research subject  Z00.6     Clinically appears that IPF is stable Glad you are better with your shortness of breath after pacemaker insertion It seems that syncope spells also might be better but too early to tell  Plan -Continue open label extension monoclonal antibody through the [place] study  -Do spirometry and DLCO in 3 months  Follow-up -Return to see Dr. Chase Caller 30-minute slot in 3 months any day but preferably ILD day but after spirometry and DLCO  -Timing of next CT scan of the chest to be decided at the next follow-up

## 2020-04-07 NOTE — Progress Notes (Signed)
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 hypertension. 3. Aortic atherosclerosis, in addition to left main and 3 vessel coronary artery disease. Assessment for potential risk factor modification, dietary therapy or pharmacologic therapy may be warranted, if clinically indicated. 4. There are severe calcifications of the aortic valve. Echocardiographic correlation for evaluation of potential valvular dysfunction may be warranted if clinically indicated.  Aortic Atherosclerosis (ICD10-I70.0).   Electronically Signed   By: Vinnie Langton M.D.   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   reports that he has never smoked. He has never used smokeless tobacco.  Past Surgical History:  Procedure Laterality Date  . ANTERIOR FUSION CERVICAL SPINE  2012  . CARDIAC CATHETERIZATION  2011  . CARDIAC CATHETERIZATION N/A 11/09/2016   Procedure: Right Heart Cath;  Surgeon: Belva Crome, MD;  Location: [facility];  Service: Cardiovascular;  Laterality: N/A;  . carpel tunnel     left wrist  . CATARACT EXTRACTION    . CATARACT EXTRACTION W/ INTRAOCULAR LENS  IMPLANT, BILATERAL  2013  . CERVICAL LAMINECTOMY  2012  . COLONOSCOPY N/A 01/14/2013   Procedure: COLONOSCOPY;  Surgeon: Irene Shipper, MD;  Location: [facility];  Service: Endoscopy;  Laterality: N/A;  . EYE SURGERY    . [place]   left  .  LUMBAR LAMINECTOMY  2003  . LUNG BIOPSY Left 12/26/2016   Procedure: LUNG BIOPSY;  Surgeon: [name], MD;  Location: [facility];  Service: Thoracic;  Laterality: Left;  . PACEMAKER IMPLANT N/A 03/30/2020   Procedure: PACEMAKER IMPLANT;  Surgeon: Evans Lance, MD;  Location: [facility];  Service: Cardiovascular;  Laterality: N/A;  . POSTERIOR FUSION CERVICAL SPINE  2012  . TRIGGER FINGER RELEASE  2011   4th finger left hand  . VIDEO ASSISTED THORACOSCOPY Left 12/26/2016   Procedure: VIDEO ASSISTED THORACOSCOPY;  Surgeon: [name], MD;  Location: [facility];  Service: Thoracic;  Laterality: Left;  [place] VIDEO BRONCHOSCOPY N/A 12/26/2016   Procedure: VIDEO BRONCHOSCOPY;  Surgeon: [name], MD;  Location: [facility];  Service: Thoracic;  Laterality: N/A;    Allergies  Allergen Reactions  . Codeine Hives and Itching  . Other Diarrhea and Other (See Comments)    Esbriet causes elevated LFTs. D/C on 06/14/17 and SEVERE DIARRHEA  . Ofev [Nintedanib] Diarrhea    SEVERE DIARRHEA    Immunization History  Administered Date(s) Administered  . Influenza Split 08/14/2017  . Influenza, High Dose Seasonal PF 08/05/2016,  09/02/2019  . PFIZER SARS-COV-2 Vaccination 12/28/2019, 01/18/2020  . Pneumococcal Conjugate-13 01/14/2015  . Pneumococcal Polysaccharide-23 12/05/2014    Family History  Problem Relation Age of Onset  . Diabetes Mellitus II Mother   . Emphysema Father 20  . Colon cancer Neg Hx   . Esophageal cancer Neg Hx   . Rectal cancer Neg Hx   . Stomach cancer Neg Hx      Current Outpatient Medications:  .  albuterol (PROAIR HFA) 108 (90 Base) MCG/ACT inhaler, Inhale 2 puffs into the lungs every 4 (four) hours as needed for wheezing or shortness of breath., Disp: , Rfl:  .  Ascorbic Acid (VITAMIN C) 1000 MG tablet, Take 1,000 mg by mouth daily., Disp: , Rfl:  .  aspirin EC 81 MG tablet, Take 81 mg by mouth daily., Disp: , Rfl:  .  clotrimazole-betamethasone (LOTRISONE) cream, Apply 1 application topically daily as needed (for skin infection). , Disp: , Rfl: 0 .  Continuous Blood Gluc Sensor (FREESTYLE LIBRE 14 DAY SENSOR) MISC, 1 patch every 14 (fourteen) days., Disp: , Rfl:  .  EPINEPHrine 0.3 mg/0.3 mL IJ SOAJ injection, Inject 0.3 mg into the muscle once as needed for anaphylaxis. , Disp: , Rfl: 0 .  furosemide (LASIX) 40 MG tablet, Take 20 mg by mouth daily. , Disp: , Rfl:  .  insulin detemir (LEVEMIR) 100 UNIT/ML injection, Inject 12 Units into the skin at bedtime. , Disp: , Rfl:  .  insulin lispro (HUMALOG) 100 UNIT/ML injection, Inject 40 Units into the skin See admin instructions. Use as directed with V-Go 40 insulin pump, Disp: , Rfl:  .  ketotifen (ZADITOR) 0.025 % ophthalmic solution, Place 1 drop into both eyes 2 (two) times daily as needed (pollen allergies)., Disp: , Rfl:  .  LORazepam (ATIVAN) 1 MG tablet, Take 1 mg by mouth at bedtime as needed for sleep. , Disp: , Rfl:  .  losartan (COZAAR) 50 MG tablet, Take 50 mg by mouth daily., Disp: , Rfl:  .  Magnesium Oxide 400 MG CAPS, Take 1 capsule (400 mg total) by mouth daily., Disp: 30 capsule, Rfl: 3 .  metoprolol succinate (TOPROL  XL) 25 MG 24 hr tablet, Take 1 tablet (25 mg total) by mouth daily., Disp: 30 tablet, Rfl: 11 .  Multiple  Vitamin (MULTIVITAMIN WITH MINERALS) TABS tablet, Take 1 tablet by mouth daily., Disp: , Rfl:  .  pantoprazole (PROTONIX) 40 MG tablet, Take 40 mg by mouth daily.  , Disp: , Rfl:  .  potassium chloride SA (KLOR-CON) 20 MEQ tablet, Take 20 mEq by mouth 2 (two) times daily., Disp: , Rfl:       Objective:   Vitals:   04/07/20 1204  BP: (!) 158/60  Pulse: 75  Temp: 98.2 F (36.8 C)  TempSrc: Temporal  SpO2: 98%  Weight: 269 lb 9.6 oz (122.3 kg)  Height: 6' (1.829 m)    Estimated body mass index is 36.56 kg/m as calculated from the following:   Height as of this encounter: 6' (1.829 m).   Weight as of this encounter: 269 lb 9.6 oz (122.3 kg).  @WEIGHTCHANGE @  [employer]   04/07/20 1204  Weight: 269 lb 9.6 oz (122.3 kg)     Physical Exam  Obese male with basal crackles.  Mild clubbing present.  Pacemaker dressing site looks healthy.  No sinus no clubbing no edema.  Assessment:       ICD-10-CM   1. 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[place])  J84.112   2. Research subject  Z00.6        Plan:     Patient Instructions     ICD-10-CM   1. 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[place])  J84.112   2. Research subject  Z00.6     Clinically appears that IPF is stable Glad you are better with your shortness of breath after pacemaker insertion It seems that syncope spells also might be better but too early to tell  Plan -Continue open label extension monoclonal antibody through the [place] study  -Do spirometry and DLCO in 3 months  Follow-up -Return to see Dr. Chase Caller 30-minute slot in 3 months any day but preferably ILD day but after spirometry and DLCO  -Timing of next CT scan of the chest to be decided at the next follow-up   (Level 04: Estb 30-39 min n  visit type: on-site physical face to visit visit spent in total care time and counseling or/and  coordination of care by this undersigned MD - Dr Brand Males. This includes one or more of the following on this same day 04/07/2020: pre-charting, chart review, note writing, documentation discussion of test results, diagnostic or treatment recommendations, prognosis, risks and benefits of management options, instructions, education, compliance or risk-factor reduction. It excludes time spent by the [place] or office staff in the care of the patient . Actual time is 35 min)   SIGNATURE    Dr. Brand Males, M.D., F.C.C.P,  Pulmonary and Critical Care Medicine Staff Physician, [place] Director - Interstitial Lung Disease  Program  [facility] [place], [place], [address]  Pager: [phone], If no answer or between  15:00h - 7:00h: call [phone] Telephone: [phone]  12:42 PM 04/07/2020

## 2020-04-08 ENCOUNTER — Telehealth: Payer: Self-pay | Admitting: Internal Medicine

## 2020-04-08 NOTE — Telephone Encounter (Signed)
[name] called back-- please return call between 2-3pm  Ext [place] is expecting the call.

## 2020-04-08 NOTE — Telephone Encounter (Signed)
Spoke with Shane [name]  She is asking if we can place an order for undergrad course at pulmonary rehab  Please advise if this is okay, thanks!

## 2020-04-08 NOTE — Telephone Encounter (Signed)
Attempted to call pt's wife [name] but unable to reach. Left message for her to return call.

## 2020-04-09 ENCOUNTER — Other Ambulatory Visit: Payer: Self-pay

## 2020-04-09 ENCOUNTER — Ambulatory Visit (INDEPENDENT_AMBULATORY_CARE_PROVIDER_SITE_OTHER): Payer: Medicare Other | Admitting: Emergency Medicine

## 2020-04-09 ENCOUNTER — Telehealth: Payer: Self-pay | Admitting: Internal Medicine

## 2020-04-09 DIAGNOSIS — I459 Conduction disorder, unspecified: Secondary | ICD-10-CM | POA: Diagnosis not present

## 2020-04-09 NOTE — Telephone Encounter (Signed)
ATC pt's wife, [name]. There was no answer and no option to leave a message. Will try back.

## 2020-04-09 NOTE — Telephone Encounter (Signed)
Diagnosis IPF.  Okay to do pulmonary rehabilitation but only on recumbent bike.  Needs cardiology electrophysiology clearance first.  Maintenance/undergraduate program okay but needs cardiology clearance first.  Patient has been informed this.  He is supposed to follow-up with EP

## 2020-04-09 NOTE — Telephone Encounter (Signed)
Spoke with patient's wife to advise she may assist patient to appointment. Appointment notes updated. Patient's wife verbalizes understanding and appreciation.

## 2020-04-09 NOTE — Telephone Encounter (Signed)
MR, please advise what I am supposed to be handling with pt as I was not aware I was supposed to be handling anything. I was not with you in clinic the last time pt was in office for appt.

## 2020-04-09 NOTE — Telephone Encounter (Signed)
EP - is electrophysioloigy /cardiology . It is not [employer] came from triage - so triage to handle it

## 2020-04-09 NOTE — Telephone Encounter (Signed)
Raquel Sarna are you handling this?

## 2020-04-09 NOTE — Telephone Encounter (Signed)
New Message    Pts wife is calling and is wanting know if she can assist the pt to his appt today because he uses a wheel chair     Please advise

## 2020-04-14 LAB — CUP PACEART INCLINIC DEVICE CHECK
Battery Remaining Longevity: 104 mo
Brady Statistic RA Percent Paced: 41 %
Brady Statistic RV Percent Paced: 98 %
Date Time Interrogation Session: 20210506150700
Implantable Lead Implant Date: 20210426
Implantable Lead Implant Date: 20210426
Implantable Lead Location: 753859
Implantable Lead Location: 753860
Implantable Lead Model: 377171
Implantable Lead Model: 377171
Implantable Lead Serial Number: 7000191418
Implantable Lead Serial Number: 7000203411
Implantable Pulse Generator Implant Date: 20210426
Lead Channel Impedance Value: 507 Ohm
Lead Channel Impedance Value: 663 Ohm
Lead Channel Pacing Threshold Amplitude: 0.6 V
Lead Channel Pacing Threshold Amplitude: 0.6 V
Lead Channel Pacing Threshold Pulse Width: 0.4 ms
Lead Channel Pacing Threshold Pulse Width: 0.4 ms
Lead Channel Sensing Intrinsic Amplitude: 14.5 mV
Lead Channel Sensing Intrinsic Amplitude: 4.9 mV
Lead Channel Setting Pacing Amplitude: 3 V
Lead Channel Setting Pacing Amplitude: 3 V
Lead Channel Setting Pacing Pulse Width: 0.4 ms
Pulse Gen Model: 407145
Pulse Gen Serial Number: 69858440

## 2020-04-14 NOTE — Telephone Encounter (Signed)
Pt's wife's place of employment does not open until 0830. Sending message to triage for follow up later this morning.

## 2020-04-14 NOTE — Telephone Encounter (Signed)
Shane [name] wife states needs to clearance form for Pulmonary Rehab. Dr. Cristopher Peru fax number is [phone]. Shane [name] phone number is [phone] c or [phone] w.

## 2020-04-14 NOTE — Telephone Encounter (Signed)
Called and spoke with pt's wife [name] letting her know the info stated by MR that it is okay for pt to do pulm rehab but only on the recumbent bike but he needs to have cardiology/electrophysiology clearance first before he can begin. [name] verbalized understanding and stated she would contact cards. She stated that she would call us back when she has more info for [place]. Will leave encounter open.

## 2020-04-14 NOTE — Progress Notes (Signed)
Wound check appointment. Steri-strips removed. Wound without redness or edema. Incision edges approximated, wound well healed. Normal device function. Thresholds, sensing, and impedances consistent with implant measurements. Device programmed at 3.5V/auto capture programmed on for extra safety margin until 3 month visit. Histogram distribution appropriate for patient and level of activity. No mode switches or high ventricular rates noted. Patient educated about wound care, arm mobility, lifting restrictions. ROV with Dr Lovena Le 06/30/20. Education done concerning remote monitoring, next remote scheduled for 06/29/20.

## 2020-04-14 NOTE — Telephone Encounter (Signed)
Spoke with wife and she states she spoke with Dr. Lovena Le about the clearance. Dr. Lovena Le is now wanting a clearance from a respiratory standpoint from MR and would like it faxed to the number given in previous message. MR please advise.

## 2020-04-15 NOTE — Telephone Encounter (Signed)
From a pulmonary standpoint okay to attend pulmonary rehabilitation

## 2020-04-16 ENCOUNTER — Encounter: Payer: Self-pay | Admitting: *Deleted

## 2020-04-16 NOTE — Telephone Encounter (Signed)
Letter done and faxed to the number requested  [name] notified this was done

## 2020-04-24 ENCOUNTER — Other Ambulatory Visit: Payer: Self-pay

## 2020-04-24 ENCOUNTER — Ambulatory Visit (HOSPITAL_COMMUNITY)
Admission: RE | Admit: 2020-04-24 | Discharge: 2020-04-24 | Disposition: A | Discharge status: Home / Self Care | Payer: Medicare Other | Source: Ambulatory Visit | Attending: Internal Medicine | Admitting: Internal Medicine

## 2020-04-24 DIAGNOSIS — Z006 Encounter for examination for normal comparison and control in clinical research program: Secondary | ICD-10-CM | POA: Insufficient documentation

## 2020-04-24 DIAGNOSIS — J84112 Idiopathic pulmonary fibrosis: Secondary | ICD-10-CM | POA: Insufficient documentation

## 2020-04-24 MED ORDER — STUDY - FIBROGEN - PAMREVLUMAB 10 MG/ML IV INFUSION (OPEN LABEL) (PI-RAMASWAMY)
30.0000 mg/kg | Freq: Once | INTRAVENOUS | Status: AC
  Administered 2020-04-24: 3670 mg via INTRAVENOUS
  Filled 2020-04-24: qty 367

## 2020-04-24 NOTE — Research (Signed)
Title: [id number] Open-Label Extension (OLE) is a multi-center, single-arm, open-label extension (OLE) phase where subjects who complete the Week 48 visit of the main study are eligible to participate. The extension to the main study allows for the continued evaluation of the efficacy and safety of 30 mg/kg IV infusions of pamrevlumab administered every 3 weeks for 52 weeks in subjects with Idiopathic Pulmonary Fibrosis.  Primary endpoint is: To provide continued access to pamrevlumab in subjects with IPF who completed the Week 48 visit of the main study  Protocol #: [id number], Clinical Trials #: [id number] Sponsor: [email or web address]  [place], [place], [place])  Protocol Version for 04/24/2020 date of [id number] Consent Version for 04/24/2020 date of 30SEP2020 Investigator Brochure Version for 03/13/2020 date of 03DEC2020   Clinical Research Coordinator / Research RN note : This visit for Subject Shane [name] with DOB: 04-12-1941 on 03/13/2020 for the above protocol is Visit/Encounter # EX Week 24 and is for purpose of research. The consent for this encounter is under Protocol Version Amendment 5.0 ([id number]) and is currently IRB approved. Subject expressed continued interest and consent in continuing as a study subject. Subject confirmed that there was no change in contact information (e.g. address,telephone, email). Subject thanked for participation in research and contribution to science.   During this visit on 04/24/2020, all procedures were completed according to the above mentioned protocol. Please refer to the subjects paper source binder for further documentation of the visit.  Signed by  [place] Assistant [facility]  [place], [place] 12:15 PM 04/24/2020

## 2020-05-12 ENCOUNTER — Telehealth: Payer: Self-pay | Admitting: Internal Medicine

## 2020-05-12 DIAGNOSIS — J84112 Idiopathic pulmonary fibrosis: Secondary | ICD-10-CM

## 2020-05-12 NOTE — Telephone Encounter (Signed)
MR, please advise if you are okay with [place] placing order for pulm rehab.

## 2020-05-14 ENCOUNTER — Ambulatory Visit (HOSPITAL_COMMUNITY)
Admission: RE | Admit: 2020-05-14 | Discharge: 2020-05-14 | Disposition: A | Discharge status: Home / Self Care | Payer: Medicare Other | Source: Ambulatory Visit | Attending: Internal Medicine | Admitting: Internal Medicine

## 2020-05-14 ENCOUNTER — Encounter (HOSPITAL_COMMUNITY): Payer: Self-pay | Admitting: *Deleted

## 2020-05-14 ENCOUNTER — Other Ambulatory Visit: Payer: Self-pay

## 2020-05-14 DIAGNOSIS — Z006 Encounter for examination for normal comparison and control in clinical research program: Secondary | ICD-10-CM | POA: Insufficient documentation

## 2020-05-14 MED ORDER — STUDY - FIBROGEN - PAMREVLUMAB 10 MG/ML IV INFUSION (OPEN LABEL) (PI-RAMASWAMY)
30.0000 mg/kg | Freq: Once | INTRAVENOUS | Status: AC
  Administered 2020-05-14: 3670 mg via INTRAVENOUS
  Filled 2020-05-14: qty 367

## 2020-05-14 NOTE — Progress Notes (Signed)
Received referral from Dr. Chase Caller for this pt to participate in pulmonary rehab with the the diagnosis of IP.  Pt who is well known to pulmonary rehab staff from previous participation..  Also received okay from Dr, Lovena Le.  Pt with PPM placement on 4/26. l Clinical review of pt follow up appt on 5/421  Pulmonary office note.  Pt with Covid Risk Score - 7. Pt appropriate for scheduling for Pulmonary rehab.  Will forward to support staff  verification of insurance eligibility/benefits with pt consent. Cherre Huger, BSN Cardiac and Training and development officer

## 2020-05-14 NOTE — Telephone Encounter (Signed)
Yes pulmonary rehab   - indication - IPF

## 2020-05-14 NOTE — Research (Signed)
Title: [id number] Open-Label Extension (OLE) is a multi-center, single-arm, open-label extension (OLE) phase where subjects who complete the Week 48 visit of the main study are eligible to participate. The extension to the main study allows for the continued evaluation of the efficacy and safety of 30 mg/kg IV infusions of pamrevlumab administered every 3 weeks for 52 weeks in subjects with Idiopathic Pulmonary Fibrosis.  Primary endpoint is: To provide continued access to pamrevlumab in subjects with IPF who completed the Week 48 visit of the main study  Protocol #: [id number], Clinical Trials #: [id number] Sponsor: www.[email or web address]  ([place], [place], [place])  Protocol Version for6/10/2021date of [id number] Consent Version for 6/10/2021date of [id number] Investigator Brochure Version for6/10/2021date of [id number]  Clinical Research Coordinator / Research RN note : This visit for Subject Shane [name] with DOB: [birth year] on 05/14/2020 for the above protocol is Visit/Encounter # Ex Week 27  and is for the purpose of research. The consent for this encounter is under Protocol Version Amendment 5.0 ([id number])[facility] iscurrently IRB approved. Subject expressed continued interest and consent in continuing as a study subject. Subject confirmed that there was nochange in contact information (e.g. address,telephone, email). Subject thanked for participation in research and contribution to science.  During this visit on 05/14/2020, all procedures were completed according to the above mentioned protocol. Please refer to the subject's paper source binder for further documentation of the visit.  Signed by  Verline Lema Research Assistant [facility]  [place], [place] 9:50 AM 05/14/2020

## 2020-05-14 NOTE — Telephone Encounter (Signed)
Order has been placed for pulmonary rehab.

## 2020-05-15 ENCOUNTER — Encounter (HOSPITAL_COMMUNITY): Payer: Medicare Other

## 2020-05-20 ENCOUNTER — Telehealth (HOSPITAL_COMMUNITY): Payer: Self-pay

## 2020-05-28 ENCOUNTER — Telehealth (HOSPITAL_COMMUNITY): Payer: Self-pay

## 2020-05-28 ENCOUNTER — Encounter (HOSPITAL_COMMUNITY): Payer: Medicare Other

## 2020-05-28 ENCOUNTER — Telehealth (HOSPITAL_COMMUNITY): Payer: Self-pay | Admitting: Internal Medicine

## 2020-06-03 ENCOUNTER — Ambulatory Visit (HOSPITAL_COMMUNITY)
Admission: RE | Admit: 2020-06-03 | Discharge: 2020-06-03 | Disposition: A | Discharge status: Home / Self Care | Payer: Medicare Other | Source: Ambulatory Visit | Attending: Internal Medicine | Admitting: Internal Medicine

## 2020-06-03 ENCOUNTER — Encounter: Payer: Medicare Other | Admitting: *Deleted

## 2020-06-03 ENCOUNTER — Other Ambulatory Visit: Payer: Self-pay

## 2020-06-03 DIAGNOSIS — Z006 Encounter for examination for normal comparison and control in clinical research program: Secondary | ICD-10-CM

## 2020-06-03 DIAGNOSIS — J84112 Idiopathic pulmonary fibrosis: Secondary | ICD-10-CM | POA: Insufficient documentation

## 2020-06-03 MED ORDER — STUDY - FIBROGEN - PAMREVLUMAB 10 MG/ML IV INFUSION (OPEN LABEL) (PI-RAMASWAMY)
30.0000 mg/kg | Freq: Once | INTRAVENOUS | Status: AC
  Administered 2020-06-03: 3670 mg via INTRAVENOUS
  Filled 2020-06-03: qty 367

## 2020-06-03 NOTE — Research (Signed)
Title: [id number] Open-Label Extension (OLE) is a multi-center, single-arm, open-label extension (OLE) phase where subjects who complete the Week 48 visit of the main study are eligible to participate. The extension to the main study allows for the continued evaluation of the efficacy and safety of 30 mg/kg IV infusions of pamrevlumab administered every 3 weeks for 52 weeks in subjects with Idiopathic Pulmonary Fibrosis.  Primary endpoint is: To provide continued access to pamrevlumab in subjects with IPF who completed the Week 48 visit of the main study  Protocol #: [id number], Clinical Trials #: [id number] Sponsor: [email or web address]  [place], [place], [place])  Protocol Version for 06/03/2020 date of  [id number] Consent Version for 06/03/2020 date of 03Dec2020 Investigator Brochure Version for 06/03/2020 date of 30Sep2020  Clinical Research Coordinator / Research RN note : This visit for Subject Shane [name] with DOB: 03/25/1941 on 06/03/2020 for the above protocol is Visit week 30 and is for purpose of research. The consent for this encounter is under Protocol Version Amendment 5 ([id number]) and  is currently IRB approved. Subject expressed continued interest and consent in continuing as a study subject. Subject confirmed that there was no change in contact information (e.g. address, telephone, email). Subject thanked for participation in research and contribution to science.   During this visit on 30Jun2021, all procedures were completed according to the above mentioned protocol. Subject mentioned a new issue with irritation of his left eye due to entropion of the left lower eyelid post-eye exam 2 weeks ago. This was noted as an AE. Refer to the subject's study source binder for further details of the visit. He will return in 3 weeks for the next study visit.  Signed by Hale Drone, MS, [place]  [place], [place] 11:30 AM 06/03/2020

## 2020-06-09 ENCOUNTER — Telehealth (HOSPITAL_COMMUNITY): Payer: Self-pay | Admitting: *Deleted

## 2020-06-09 NOTE — Telephone Encounter (Signed)
Called to remind Clair Gulling of appointment in pulmonary rehab for Wednesday, June 10, 2020 @ 0900.

## 2020-06-10 ENCOUNTER — Other Ambulatory Visit: Payer: Self-pay

## 2020-06-10 ENCOUNTER — Encounter (HOSPITAL_COMMUNITY)
Admission: RE | Admit: 2020-06-10 | Discharge: 2020-06-10 | Disposition: A | Discharge status: Home / Self Care | Payer: Medicare Other | Source: Ambulatory Visit | Attending: Internal Medicine | Admitting: Internal Medicine

## 2020-06-10 VITALS — BP 110/64 | Ht 72.0 in | Wt 265.4 lb

## 2020-06-10 DIAGNOSIS — J84112 Idiopathic pulmonary fibrosis: Secondary | ICD-10-CM | POA: Diagnosis not present

## 2020-06-10 NOTE — Progress Notes (Signed)
Shane [name] 79 y.o. male Pulmonary Rehab Orientation Note Patient arrived today in Cardiac and Pulmonary Rehab for orientation to Pulmonary Rehab. He walked from the [facility] parking deck with the use of a cane, he did stop to rest x 1 due to shortness of breath.. He does not carry portable oxygen. Per pt, he uses oxygen never. Color good, skin warm and dry. Patient is oriented to time and place. Patient's medical history, psychosocial health, and medications reviewed. Psychosocial assessment reveals pt lives with their spouse. Pt is currently retired. 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 Pt reports his stress level is low.  Pt does not exhibit signs of depression. PHQ2/9 score 0/0. Pt shows good  coping skills with positive outlook . Will continue to monitor and evaluate progress toward psychosocial goal(s) of continued mental wellbeing while participating in pulmonary rehab. Physical assessment reveals heart rate is normal, breath sounds clear to auscultation, no wheezes, rales, or rhonchi. Grip strength equal, strong. Patient reports hedoes take medications as prescribed. Patient states he follows a Diabetic diet. He has an insulin pump and a glucose reader.  He has lost 6-8 pounds over the last 6 months, states he eats less carbohydrates now. The patient reports no specific efforts to gain or lose weight.. Patient's weight will be monitored closely. Demonstration and practice of PLB using pulse oximeter. Patient able to return demonstration satisfactorily. Safety and hand hygiene in the exercise area reviewed with patient. Patient voices understanding of the information reviewed. Department expectations discussed with patient and achievable goals were set. The patient shows enthusiasm about attending the program and we look forward to working with this nice gentleman. The patient completed a 6 min walk test today, 06/10/2020 and to begin exercise on Tuesday, 06/16/2020 in the  1000 exercise slot.  [id number]

## 2020-06-10 NOTE — Progress Notes (Signed)
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  albuterol (PROAIR HFA) 108 (90 Base) MCG/ACT inhaler, Inhale 2 puffs into the lungs every 4 (four) hours as needed for wheezing or shortness of breath., Disp: , Rfl:  .  Ascorbic Acid (VITAMIN C) 1000 MG tablet, Take 1,000 mg by mouth daily., Disp: , Rfl:  .  aspirin EC 81 MG tablet, Take 81 mg by mouth daily., Disp: , Rfl:  .  clotrimazole-betamethasone (LOTRISONE) cream, Apply 1 application topically daily as needed (for skin infection). , Disp: , Rfl: 0 .  Continuous Blood Gluc Sensor (FREESTYLE LIBRE 14 DAY SENSOR) MISC, 1 patch every 14 (fourteen) days., Disp: , Rfl:  .  EPINEPHrine 0.3 mg/0.3 mL IJ SOAJ injection, Inject 0.3 mg into the muscle once as needed for anaphylaxis. , Disp: , Rfl: 0 .  furosemide (LASIX) 40 MG tablet, Take 20 mg by mouth daily. , Disp: , Rfl:  .  insulin detemir (LEVEMIR) 100 UNIT/ML injection, Inject 12 Units into the skin at bedtime. , Disp: , Rfl:  .  insulin lispro (HUMALOG) 100 UNIT/ML injection, Inject 40 Units into the skin See admin instructions. Use as directed with V-Go 40 insulin pump, Disp: , Rfl:  .  ketotifen (ZADITOR) 0.025 % ophthalmic solution, Place 1 drop into both eyes 2 (two) times daily as needed (pollen allergies)., Disp: , Rfl:  .  LORazepam (ATIVAN) 1 MG tablet, Take 1 mg by mouth at bedtime as needed for sleep. , Disp: , Rfl:  .  losartan (COZAAR) 50 MG tablet, Take 50 mg by mouth daily., Disp:  , Rfl:  .  Magnesium Oxide 400 MG CAPS, Take 1 capsule (400 mg total) by mouth daily., Disp: 30 capsule, Rfl: 3 .  metoprolol succinate (TOPROL XL) 25 MG 24 hr tablet, Take 1 tablet (25 mg total) by mouth daily., Disp: 30 tablet, Rfl: 11 .  Multiple Vitamin (MULTIVITAMIN WITH MINERALS) TABS tablet, Take 1 tablet by mouth daily., Disp: , Rfl:  .  pantoprazole (PROTONIX) 40 MG tablet, Take 40 mg by mouth daily.  , Disp: , Rfl:  .  potassium chloride SA (KLOR-CON) 20 MEQ tablet, Take 20 mEq by mouth 2 (two) times daily., Disp: , Rfl:   Past Medical History: Past Medical History:  Diagnosis Date  . 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 Cataract   . Chronic kidney disease    d/t DM  . CKD (chronic kidney disease), stage III   . Diabetes mellitus    Vgo disposal insulin bolus  simular to insulin pump  . Dyspnea   . GERD (gastroesophageal reflux disease)   . Hyperlipidemia   . Hypertension   . 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 ILD (interstitial lung disease) ([place])   . Moderate aortic stenosis    a. 10/2019 Echo: EF 55-60%, Gr2 DD. Nl RV.   Marland Kitchen Neuromuscular disorder ([place])   . Nonobstructive CAD (coronary artery disease)    a. 2012 Cath: mod, nonobs dzs;  b. 10/2016 MV: EF 60%, no ischemia.  Marland Kitchen PONV (postoperative nausea and vomiting)   . Sleep apnea     uses cpap asked to bring mask and tubing    Tobacco Use: Social History   Tobacco Use  Smoking Status Never Smoker  Smokeless Tobacco Never Used    Labs: Recent Review Flowsheet Data    Labs for ITP Cardiac and Pulmonary Rehab Latest Ref Rng & Units 12/26/2016 12/27/2016 02/18/2018 04/27/2018 03/28/2020   Cholestrol 0 - 200 mg/dL - - - - -   LDLCALC 0 - 99 mg/dL - - - - -   HDL >39 mg/dL - - - - -   Trlycerides <150 mg/dL - - - - -   Hemoglobin A1c 4.8 - 5.6 % - - - - 7.6(H)   PHART 7.35 - 7.45 7.376 7.362 - - -   PCO2ART 32 - 48 mmHg 44.0 45.4 - - -   HCO3 20.0 - 28.0 mmol/L 25.2 25.1 - - -   TCO2 22 - 32 mmol/L - - 25 21(L) -    ACIDBASEDEF 0.0 - 2.0 mmol/L - - - - -   O2SAT % 93.9 95.7 - - -      Capillary Blood Glucose: Lab Results  Component Value Date   GLUCAP 105 (H) 03/31/2020   GLUCAP 90 03/30/2020   GLUCAP 76 03/30/2020   GLUCAP 108 (H) 03/30/2020   GLUCAP 96 03/30/2020     Pulmonary Assessment Scores:  Pulmonary Assessment Scores    Row Name 06/10/20 0947 06/10/20 1034       ADL UCSD   ADL Phase Entry --    SOB Score total 79 --      CAT Score   CAT Score 17 --      mMRC Score   mMRC Score -- 4          UCSD: Self-administered rating of dyspnea associated with activities of daily living (ADLs) 6-point scale (0 = "not at all" to 5 = "maximal or unable to do because of breathlessness")  Scoring Scores range from 0 to 120.  Minimally important difference is 5 units  CAT: CAT can identify the health impairment of COPD patients and is better correlated with disease progression.  CAT has a scoring range of zero to 40. The CAT score is classified into four groups of low (less than 10), medium (10 - 20), high (21-30) and very high (31-40) based on the impact level of disease on health status. A CAT score over 10 suggests significant symptoms.  A worsening CAT score could be explained by an exacerbation, poor medication adherence, poor inhaler technique, or progression of COPD or comorbid conditions.  CAT MCID is 2 points  mMRC: mMRC (Modified Medical Research Council) Dyspnea Scale is used to assess the degree of baseline functional disability in patients of respiratory disease due to dyspnea. No minimal important difference is established. A decrease in score of 1 point or greater is considered a positive change.   Pulmonary Function Assessment:  Pulmonary Function Assessment - 06/10/20 0946      Breath   Bilateral Breath Sounds Clear    Shortness of Breath Yes;Limiting activity           Exercise Target Goals: Exercise Program Goal: Individual exercise prescription set using  results from initial 6 min walk test and THRR while considering  patient's activity barriers and safety.   Exercise Prescription Goal: Initial exercise prescription builds to 30-45 minutes a day  of aerobic activity, 2-3 days per week.  Home exercise guidelines will be given to patient during program as part of exercise prescription that the participant will acknowledge.  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 Pt had lightheadedness at 2:20. Pt sat for 1:10 then resumed. 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 SOB, RPD = 3     Resting HR 59 bpm     Resting BP 110/64     Resting Oxygen Saturation  97 %     Exercise Oxygen Saturation  during 6 min walk 91 %     Max Ex. HR 102 bpm     Max Ex. BP 140/76     2 Minute Post BP 124/72       Interval HR   1 Minute HR 75     2 Minute HR 95     3 Minute HR 97     4 Minute HR 91     5 Minute HR 99     6 Minute HR 102     2 Minute Post HR 73     Interval Heart Rate? Yes       Interval Oxygen   Interval Oxygen? Yes     Baseline Oxygen Saturation % 97 %     1 Minute Oxygen Saturation % 94 %     1 Minute Liters of Oxygen 0 L     2 Minute Oxygen Saturation % 91 %     2 Minute Liters of Oxygen 0 L     3 Minute Oxygen Saturation % 95 %     3 Minute  Liters of Oxygen 0 L     4 Minute Oxygen Saturation % 95 %     4 Minute Liters of Oxygen 0 L     5 Minute Oxygen Saturation % 92 %     5 Minute Liters of Oxygen 0 L     6 Minute Oxygen Saturation % 91 %     6 Minute Liters of Oxygen 0 L     2 Minute Post Oxygen Saturation % 96 %     2 Minute Post Liters of Oxygen 0 L            Oxygen Initial Assessment:  Oxygen Initial Assessment - 06/10/20 1033      Initial 6 min Walk   Oxygen Used None      Program Oxygen Prescription   Program Oxygen Prescription None      Intervention  Short Term Goals To learn and understand importance of monitoring SPO2 with pulse oximeter and demonstrate accurate use of the pulse oximeter.;To learn and understand importance of maintaining oxygen saturations>88%;To learn and demonstrate proper pursed lip breathing techniques or other breathing techniques.;To learn and demonstrate proper use of respiratory medications    Long  Term Goals Verbalizes importance of monitoring SPO2 with pulse oximeter and return demonstration;Maintenance of O2 saturations>88%;Exhibits proper breathing techniques, such as pursed lip breathing or other method taught during program session;Compliance with respiratory medication           Oxygen Re-Evaluation:   Oxygen Discharge (Final Oxygen Re-Evaluation):   Initial Exercise Prescription:  Initial Exercise Prescription - 06/10/20 1000      Date of Initial Exercise RX and Referring Provider   Date 06/10/20    Referring Provider Brand Males (P)     Expected Discharge Date 08/06/20 (P)       NuStep   Level 1    SPM 60    Minutes 25    METs 1.5      Prescription Details   Frequency (times per week) 2    Duration Progress to 30 minutes of continuous aerobic without signs/symptoms of physical distress      Intensity   THRR 40-80% of Max Heartrate 56-113    Ratings of Perceived Exertion 11-13    Perceived Dyspnea 0-4      Progression   Progression  Continue progressive overload as per policy without signs/symptoms or physical distress.      Resistance Training   Training Prescription Yes    Weight Resistance Band    Reps 10-15           Perform Capillary Blood Glucose checks as needed.  Exercise Prescription Changes:   Exercise Comments:   Exercise Goals and Review:  Exercise Goals    Row Name 06/10/20 1046             Exercise Goals   Increase Physical Activity Yes       Intervention Provide advice, education, support and counseling about physical activity/exercise needs.;Develop an individualized exercise prescription for aerobic and resistive training based on initial evaluation findings, risk stratification, comorbidities and participant's personal goals.       Expected Outcomes Short Term: Attend rehab on a regular basis to increase amount of physical activity.;Long Term: Add in home exercise to make exercise part of routine and to increase amount of physical activity.;Long Term: Exercising regularly at least 3-5 days a week.       Increase Strength and Stamina Yes       Intervention Provide advice, education, support and counseling about physical activity/exercise needs.;Develop an individualized exercise prescription for aerobic and resistive training based on initial evaluation findings, risk stratification, comorbidities and participant's personal goals.       Expected Outcomes Short Term: Increase workloads from initial exercise prescription for resistance, speed, and METs.;Short Term: Perform resistance training exercises routinely during rehab and add in resistance training at home;Long Term: Improve cardiorespiratory fitness, muscular endurance and strength as measured by increased METs and functional capacity (6MWT)       Able to understand and use rate of perceived exertion (RPE) scale Yes       Intervention Provide education and explanation on how to use RPE scale       Expected Outcomes Short Term: Able to  use RPE daily in rehab to express subjective intensity level;Long Term:  Able to use RPE to guide intensity level  when exercising independently       Able to understand and use Dyspnea scale Yes       Intervention Provide education and explanation on how to use Dyspnea scale       Expected Outcomes Short Term: Able to use Dyspnea scale daily in rehab to express subjective sense of shortness of breath during exertion;Long Term: Able to use Dyspnea scale to guide intensity level when exercising independently       Knowledge and understanding of Target Heart Rate Range (THRR) Yes       Intervention Provide education and explanation of THRR including how the numbers were predicted and where they are located for reference       Expected Outcomes Short Term: Able to state/look up THRR;Long Term: Able to use THRR to govern intensity when exercising independently;Short Term: Able to use daily as guideline for intensity in rehab       Understanding of Exercise Prescription Yes       Intervention Provide education, explanation, and written materials on patient's individual exercise prescription       Expected Outcomes Short Term: Able to explain program exercise prescription;Long Term: Able to explain home exercise prescription to exercise independently              Exercise Goals Re-Evaluation :   Discharge Exercise Prescription (Final Exercise Prescription Changes):   Nutrition:  Target Goals: Understanding of nutrition guidelines, daily intake of sodium 1500mg , cholesterol 200mg , calories 30% from fat and 7% or less from saturated fats, daily to have 5 or more servings of fruits and vegetables.  Biometrics:  Pre Biometrics - 06/10/20 0945      Pre Biometrics   Grip Strength 30 kg            Nutrition Therapy Plan and Nutrition Goals:   Nutrition Assessments:   Nutrition Goals Re-Evaluation:   Nutrition Goals Discharge (Final Nutrition Goals  Re-Evaluation):   Psychosocial: Target Goals: Acknowledge presence or absence of significant depression and/or stress, maximize coping skills, provide positive support system. Participant is able to verbalize types and ability to use techniques and skills needed for reducing stress and depression.  Initial Review & Psychosocial Screening:  Initial Psych Review & Screening - 06/10/20 0947      Initial Review   Current issues with None Identified      Family Dynamics   Good Support System? Yes      Barriers   Psychosocial barriers to participate in program There are no identifiable barriers or psychosocial needs.      Screening Interventions   Interventions Encouraged to exercise           Quality of Life Scores:  Scores of 19 and below usually indicate a poorer quality of life in these areas.  A difference of  2-3 points is a clinically meaningful difference.  A difference of 2-3 points in the total score of the Quality of Life Index has been associated with significant improvement in overall quality of life, self-image, physical symptoms, and general health in studies assessing change in quality of life.  PHQ-9: Recent Review Flowsheet Data    Depression screen [facility] 2/9 06/10/2020   Decreased Interest 0   Down, Depressed, Hopeless 0   PHQ - 2 Score 0   Altered sleeping 0   Tired, decreased energy 0   Change in appetite 0   Feeling bad or failure about yourself  0   Trouble concentrating 0   Moving slowly or  fidgety/restless 0   Suicidal thoughts 0   Difficult doing work/chores Not difficult at all     Interpretation of Total Score  Total Score Depression Severity:  1-4 = Minimal depression, 5-9 = Mild depression, 10-14 = Moderate depression, 15-19 = Moderately severe depression, 20-27 = Severe depression   Psychosocial Evaluation and Intervention:  Psychosocial Evaluation - 06/10/20 0948      Psychosocial Evaluation & Interventions   Interventions Encouraged to  exercise with the program and follow exercise prescription    Comments No barriers or psychosocial concerns identified at this time    Expected Outcomes For patient to continue to have no barriers or concerns while in pulmonary rehab    Continue Psychosocial Services  No Follow up required           Psychosocial Re-Evaluation:   Psychosocial Discharge (Final Psychosocial Re-Evaluation):   Education: Education Goals: Education classes will be provided on a weekly basis, covering required topics. Participant will state understanding/return demonstration of topics presented.  Learning Barriers/Preferences:  Learning Barriers/Preferences - 06/10/20 0949      Learning Barriers/Preferences   Learning Barriers None    Learning Preferences Computer/Internet;Group Instruction;Individual Instruction;Audio;Pictoral;Skilled Demonstration;Verbal Instruction;Video;Written Material           Education Topics: Risk Factor Reduction:  -Group instruction that is supported by a PowerPoint presentation. Instructor discusses the definition of a risk factor, different risk factors for pulmonary disease, and how the heart and lungs work together.     Nutrition for Pulmonary Patient:  -Group instruction provided by PowerPoint slides, verbal discussion, and written materials to support subject matter. The instructor gives an explanation and review of healthy diet recommendations, which includes a discussion on weight management, recommendations for fruit and vegetable consumption, as well as protein, fluid, caffeine, fiber, sodium, sugar, and alcohol. Tips for eating when patients are short of breath are discussed.   PULMONARY REHAB OTHER RESPIRATORY from 08/15/2017 in [place]  Date 07/27/17  Educator RD  Instruction Review Code (Retired) 2- meets goals/outcomes      Pursed Lip Breathing:  -Group instruction that is supported by demonstration and informational  handouts. Instructor discusses the benefits of pursed lip and diaphragmatic breathing and detailed demonstration on how to preform both.     PULMONARY REHAB OTHER RESPIRATORY from 08/15/2017 in [place]  Date 08/10/17  Educator RT  Instruction Review Code (Retired) 2- meets goals/outcomes      Oxygen Safety:  -Group instruction provided by PowerPoint, verbal discussion, and written material to support subject matter. There is an overview of "What is Oxygen" and "Why do we need it".  Instructor also reviews how to create a safe environment for oxygen use, the importance of using oxygen as prescribed, and the risks of noncompliance. There is a brief discussion on traveling with oxygen and resources the patient may utilize.   PULMONARY REHAB OTHER RESPIRATORY from 08/15/2017 in [place]  Date 07/13/17  Educator Truddie Crumble  Instruction Review Code (Retired) R- Review/reinforce      Oxygen Equipment:  -Group instruction provided by [employer], Network engineer, and Insurance underwriter.   PULMONARY REHAB OTHER RESPIRATORY from 08/15/2017 in [place]  Date 04/27/17  Educator George/[employer]  Instruction Review Code (Retired) 2- meets goals/outcomes      Signs and Symptoms:  -Group instruction provided by written material and verbal discussion to support subject matter. Warning signs and  symptoms of infection, stroke, and heart attack are reviewed and when to call the physician/911 reinforced. Tips for preventing the spread of infection discussed.   PULMONARY REHAB OTHER RESPIRATORY from 08/15/2017 in [place]  Date 06/15/17  Educator rn  Instruction Review Code (Retired) 2- meets goals/outcomes      Advanced Directives:  -Group instruction provided by verbal instruction and written material to support subject matter. Instructor  reviews Advanced Directive laws and proper instruction for filling out document.   Pulmonary Video:  -Group video education that reviews the importance of medication and oxygen compliance, exercise, good nutrition, pulmonary hygiene, and pursed lip and diaphragmatic breathing for the pulmonary patient.   Exercise for the Pulmonary Patient:  -Group instruction that is supported by a PowerPoint presentation. Instructor discusses benefits of exercise, core components of exercise, frequency, duration, and intensity of an exercise routine, importance of utilizing pulse oximetry during exercise, safety while exercising, and options of places to exercise outside of rehab.     PULMONARY REHAB OTHER RESPIRATORY from 08/15/2017 in [place]  Date 08/15/17  Educator ep  Instruction Review Code (Retired) R- Review/reinforce      Pulmonary Medications:  -Verbally interactive group education provided by instructor with focus on inhaled medications and proper administration.   PULMONARY REHAB OTHER RESPIRATORY from 08/15/2017 in [place]  Date 08/03/17  Educator Pharm  Instruction Review Code (Retired) R- Product/process development scientist and Physiology of the Respiratory System and Intimacy:  -Group instruction provided by PowerPoint, verbal discussion, and written material to support subject matter. Instructor reviews respiratory cycle and anatomical components of the respiratory system and their functions. Instructor also reviews differences in obstructive and restrictive respiratory diseases with examples of each. Intimacy, Sex, and Sexuality differences are reviewed with a discussion on how relationships can change when diagnosed with pulmonary disease. Common sexual concerns are reviewed.   PULMONARY REHAB OTHER RESPIRATORY from 08/15/2017 in [place]  Date 06/22/17  Educator RN  Instruction Review Code  (Retired) 2- meets goals/outcomes      MD DAY -A group question and answer session with a medical doctor that allows participants to ask questions that relate to their pulmonary disease state.   PULMONARY REHAB OTHER RESPIRATORY from 08/15/2017 in [place]  Date 06/08/17  Educator Nelda Marseille  Instruction Review Code (Retired) 2- meets goals/outcomes      OTHER EDUCATION -Group or individual verbal, written, or video instructions that support the educational goals of the pulmonary rehab program.   Holiday Eating Survival Tips:  -Group instruction provided by PowerPoint slides, verbal discussion, and written materials to support subject matter. The instructor gives patients tips, tricks, and techniques to help them not only survive but enjoy the holidays despite the onslaught of food that accompanies the holidays.   Knowledge Questionnaire Score:  Knowledge Questionnaire Score - 06/10/20 0957      Knowledge Questionnaire Score   Pre Score 15/18           Core Components/Risk Factors/Patient Goals at Admission:  Personal Goals and Risk Factors at Admission - 06/10/20 0950      Core Components/Risk Factors/Patient Goals on Admission   Improve shortness of breath with ADL's Yes    Intervention Provide education, individualized exercise plan and daily activity instruction to help decrease symptoms of SOB with activities of daily living.    Expected Outcomes Short  Term: Improve cardiorespiratory fitness to achieve a reduction of symptoms when performing ADLs;Long Term: Be able to perform more ADLs without symptoms or delay the onset of symptoms           Core Components/Risk Factors/Patient Goals Review:   Goals and Risk Factor Review    Row Name 06/10/20 0950             Core Components/Risk Factors/Patient Goals Review   Personal Goals Review Improve shortness of breath with ADL's;Develop more efficient breathing techniques such as purse  lipped breathing and diaphragmatic breathing and practicing self-pacing with activity.;Increase knowledge of respiratory medications and ability to use respiratory devices properly.              Core Components/Risk Factors/Patient Goals at Discharge (Final Review):   Goals and Risk Factor Review - 06/10/20 0950      Core Components/Risk Factors/Patient Goals Review   Personal Goals Review Improve shortness of breath with ADL's;Develop more efficient breathing techniques such as purse lipped breathing and diaphragmatic breathing and practicing self-pacing with activity.;Increase knowledge of respiratory medications and ability to use respiratory devices properly.           ITP Comments:   Comments:

## 2020-06-16 ENCOUNTER — Other Ambulatory Visit: Payer: Self-pay

## 2020-06-16 ENCOUNTER — Encounter (HOSPITAL_COMMUNITY)
Admission: RE | Admit: 2020-06-16 | Discharge: 2020-06-16 | Disposition: A | Discharge status: Home / Self Care | Payer: Medicare Other | Source: Ambulatory Visit | Attending: Internal Medicine | Admitting: Internal Medicine

## 2020-06-16 VITALS — Wt 265.9 lb

## 2020-06-16 DIAGNOSIS — J84112 Idiopathic pulmonary fibrosis: Secondary | ICD-10-CM

## 2020-06-16 NOTE — Progress Notes (Signed)
Daily Session Note  Patient Details  Name: Shane [name] MRN: [id number] Date of Birth: 12-31-1940 Referring Provider:     Pulmonary Rehab Walk Test from 06/10/2020 in [place]  Referring Provider [name]       Encounter Date: 06/16/2020  Check In:  Session Check In - 06/16/20 0959      Check-In   Supervising physician immediately available to respond to emergencies [facility] immediately available    Physician(s) Dr. Sloan Leiter    Location [facility]    Staff Present Rosebud Poles, RN, Bjorn Loser, MS, CEP, Exercise Physiologist;Carlette Wilber Oliphant, RN, BSN    Virtual Visit No    Medication changes reported     No    Fall or balance concerns reported    No    Tobacco Cessation No Change    Warm-up and Cool-down Performed as group-led instruction    Resistance Training Performed Yes    VAD Patient? No    PAD/SET Patient? No      Pain Assessment   Currently in Pain? No/denies    Multiple Pain Sites No           Capillary Blood Glucose: No results found for this or any previous visit (from the past 24 hour(s)).  POCT Glucose - 06/16/20 1123      POCT Blood Glucose   Pre-Exercise 172 mg/dL    Post-Exercise 156 mg/dL           Exercise Prescription Changes - 06/16/20 1100      Response to Exercise   Blood Pressure (Admit) 130/80    Blood Pressure (Exercise) 142/62    Blood Pressure (Exit) 128/64    Heart Rate (Admit) 87 bpm    Heart Rate (Exercise) 70 bpm    Heart Rate (Exit) 95 bpm    Oxygen Saturation (Admit) 98 %    Oxygen Saturation (Exercise) 97 %    Oxygen Saturation (Exit) 97 %    Rating of Perceived Exertion (Exercise) 11    Perceived Dyspnea (Exercise) 1    Duration Continue with 30 min of aerobic exercise without signs/symptoms of physical distress.    Intensity --   40-80 % HRR     Progression   Progression Continue to progress workloads to maintain intensity without  signs/symptoms of physical distress.      Resistance Training   Training Prescription Yes    Weight Blue Bands    Reps 10-15    Time 10 Minutes      NuStep   Level 2    SPM 80    Minutes 30    METs 1.8           Social History   Tobacco Use  Smoking Status Never Smoker  Smokeless Tobacco Never Used    Goals Met:  Proper associated with RPD/PD & O2 Sat Exercise tolerated well Strength training completed today  Goals Unmet:  Not Applicable  Comments: Service time is from 0945 to [place]    Dr. Fransico Him is Medical Director for Cardiac Rehab at [facility].

## 2020-06-18 ENCOUNTER — Encounter (HOSPITAL_COMMUNITY)
Admission: RE | Admit: 2020-06-18 | Discharge: 2020-06-18 | Disposition: A | Discharge status: Home / Self Care | Payer: Medicare Other | Source: Ambulatory Visit | Attending: Internal Medicine | Admitting: Internal Medicine

## 2020-06-18 ENCOUNTER — Telehealth (HOSPITAL_COMMUNITY): Payer: Self-pay | Admitting: Internal Medicine

## 2020-06-18 DIAGNOSIS — J84112 Idiopathic pulmonary fibrosis: Secondary | ICD-10-CM

## 2020-06-18 NOTE — Progress Notes (Signed)
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  albuterol (PROAIR HFA) 108 (90 Base) MCG/ACT inhaler, Inhale 2 puffs into the lungs every 4 (four) hours as needed for wheezing or shortness of breath., Disp: , Rfl:  .  Ascorbic Acid (VITAMIN C) 1000 MG tablet, Take 1,000 mg by mouth daily., Disp: , Rfl:  .  aspirin EC 81 MG tablet, Take 81 mg by mouth daily., Disp: , Rfl:  .  clotrimazole-betamethasone (LOTRISONE) cream, Apply 1 application topically daily as needed (for skin infection). , Disp: , Rfl: 0 .  Continuous Blood Gluc Sensor (FREESTYLE LIBRE 14 DAY SENSOR) MISC, 1 patch every 14 (fourteen) days., Disp: , Rfl:  .  EPINEPHrine 0.3 mg/0.3 mL IJ SOAJ injection, Inject 0.3 mg into the muscle once as needed for anaphylaxis. , Disp: , Rfl: 0 .  furosemide (LASIX) 40 MG tablet, Take 20 mg by mouth daily. , Disp: , Rfl:  .  insulin detemir (LEVEMIR) 100 UNIT/ML injection, Inject 12 Units into the skin at bedtime. , Disp: , Rfl:  .  insulin lispro (HUMALOG) 100 UNIT/ML injection, Inject 40 Units into the skin See admin instructions. Use as directed with V-Go 40 insulin pump, Disp: , Rfl:  .  ketotifen (ZADITOR) 0.025 % ophthalmic solution, Place 1 drop into both eyes 2 (two) times daily as needed (pollen allergies)., Disp: , Rfl:  .  LORazepam (ATIVAN) 1 MG tablet, Take 1 mg by mouth at bedtime as needed for sleep. , Disp: , Rfl:  .  losartan (COZAAR) 50 MG tablet, Take 50 mg by mouth daily., Disp:  , Rfl:  .  Magnesium Oxide 400 MG CAPS, Take 1 capsule (400 mg total) by mouth daily., Disp: 30 capsule, Rfl: 3 .  metoprolol succinate (TOPROL XL) 25 MG 24 hr tablet, Take 1 tablet (25 mg total) by mouth daily., Disp: 30 tablet, Rfl: 11 .  Multiple Vitamin (MULTIVITAMIN WITH MINERALS) TABS tablet, Take 1 tablet by mouth daily., Disp: , Rfl:  .  pantoprazole (PROTONIX) 40 MG tablet, Take 40 mg by mouth daily.  , Disp: , Rfl:  .  potassium chloride SA (KLOR-CON) 20 MEQ tablet, Take 20 mEq by mouth 2 (two) times daily., Disp: , Rfl:   Past Medical History: Past Medical History:  Diagnosis Date  . 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 Cataract   . Chronic kidney disease    d/t DM  . CKD (chronic kidney disease), stage III   . Diabetes mellitus    Vgo disposal insulin bolus  simular to insulin pump  . Dyspnea   . GERD (gastroesophageal reflux disease)   . Hyperlipidemia   . Hypertension   . 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 ILD (interstitial lung disease) ([place])   . Moderate aortic stenosis    a. 10/2019 Echo: EF 55-60%, Gr2 DD. Nl RV.   Marland Kitchen Neuromuscular disorder ([place])   . Nonobstructive CAD (coronary artery disease)    a. 2012 Cath: mod, nonobs dzs;  b. 10/2016 MV: EF 60%, no ischemia.  Marland Kitchen PONV (postoperative nausea and vomiting)   . Sleep apnea     uses cpap asked to bring mask and tubing    Tobacco Use: Social History   Tobacco Use  Smoking Status Never Smoker  Smokeless Tobacco Never Used    Labs: Recent Review Flowsheet Data    Labs for ITP Cardiac and Pulmonary Rehab Latest Ref Rng & Units 12/26/2016 12/27/2016 02/18/2018 04/27/2018 03/28/2020   Cholestrol 0 - 200 mg/dL - - - - -   LDLCALC 0 - 99 mg/dL - - - - -   HDL >39 mg/dL - - - - -   Trlycerides <150 mg/dL - - - - -   Hemoglobin A1c 4.8 - 5.6 % - - - - 7.6(H)   PHART 7.35 - 7.45 7.376 7.362 - - -   PCO2ART 32 - 48 mmHg 44.0 45.4 - - -   HCO3 20.0 - 28.0 mmol/L 25.2 25.1 - - -   TCO2 22 - 32 mmol/L - - 25 21(L) -    ACIDBASEDEF 0.0 - 2.0 mmol/L - - - - -   O2SAT % 93.9 95.7 - - -      Capillary Blood Glucose: Lab Results  Component Value Date   GLUCAP 105 (H) 03/31/2020   GLUCAP 90 03/30/2020   GLUCAP 76 03/30/2020   GLUCAP 108 (H) 03/30/2020   GLUCAP 96 03/30/2020    POCT Glucose    Row Name 06/16/20 1123             POCT Blood Glucose   Pre-Exercise 172 mg/dL       Post-Exercise 156 mg/dL              Pulmonary Assessment Scores:  Pulmonary Assessment Scores    Row Name 06/10/20 0947 06/10/20 1034       ADL UCSD   ADL Phase Entry --    SOB Score total 79 --      CAT Score   CAT Score 17 --      mMRC Score   mMRC Score -- 4          UCSD: Self-administered rating of dyspnea associated with activities of daily living (ADLs) 6-point scale (0 = "not at all" to 5 = "maximal or unable to do because of breathlessness")  Scoring Scores range from 0 to 120.  Minimally important difference is 5 units  CAT: CAT can identify the health impairment of COPD patients and is better correlated with disease progression.  CAT has a scoring range of zero to 40. The CAT score is classified into four groups of low (less than 10), medium (10 - 20), high (21-30) and very high (31-40) based on the impact level of disease on health status. A CAT score over 10 suggests significant symptoms.  A worsening CAT score could be explained by an exacerbation, poor medication adherence, poor inhaler technique, or progression of COPD or comorbid conditions.  CAT MCID is 2 points  mMRC: mMRC (Modified Medical Research Council) Dyspnea Scale is used to assess the degree of baseline functional disability in patients of respiratory disease due to dyspnea. No minimal important difference is established. A decrease in score of 1 point or greater is considered a positive change.   Pulmonary Function Assessment:  Pulmonary Function Assessment - 06/10/20 0946      Breath   Bilateral Breath Sounds Clear     Shortness of Breath Yes;Limiting activity  Exercise Target Goals: Exercise Program Goal: Individual exercise prescription set using results from initial 6 min walk test and THRR while considering  patient's activity barriers and safety.   Exercise Prescription Goal: Initial exercise prescription builds to 30-45 minutes a day of aerobic activity, 2-3 days per week.  Home exercise guidelines will be given to patient during program as part of exercise prescription that the participant will acknowledge.  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 Pt had lightheadedness at 2:20. Pt sat for 1:10 then resumed. 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 SOB, RPD = 3     Resting HR 59 bpm     Resting BP 110/64     Resting Oxygen Saturation  97 %     Exercise Oxygen Saturation  during 6 min walk 91 %     Max Ex. HR 102 bpm     Max Ex. BP 140/76     2 Minute Post BP 124/72       Interval HR   1 Minute HR 75     2 Minute HR 95     3 Minute HR 97     4 Minute HR 91     5 Minute HR 99     6 Minute HR 102     2 Minute Post HR 73     Interval Heart Rate? Yes       Interval Oxygen   Interval Oxygen? Yes     Baseline Oxygen Saturation % 97 %     1 Minute Oxygen Saturation % 94 %     1 Minute  Liters of Oxygen 0 L     2 Minute Oxygen Saturation % 91 %     2 Minute Liters of Oxygen 0 L     3 Minute Oxygen Saturation % 95 %     3 Minute Liters of Oxygen 0 L     4 Minute Oxygen Saturation % 95 %     4 Minute Liters of Oxygen 0 L     5 Minute Oxygen Saturation % 92 %     5 Minute Liters of Oxygen 0 L     6 Minute Oxygen Saturation % 91 %     6 Minute Liters of Oxygen 0 L     2 Minute Post Oxygen Saturation % 96 %     2 Minute Post Liters of Oxygen 0 L            Oxygen Initial  Assessment:  Oxygen Initial Assessment - 06/10/20 1033      Initial 6 min Walk   Oxygen Used None      Program Oxygen Prescription   Program Oxygen Prescription None      Intervention   Short Term Goals To learn and understand importance of monitoring SPO2 with pulse oximeter and demonstrate accurate use of the pulse oximeter.;To learn and understand importance of maintaining oxygen saturations>88%;To learn and demonstrate proper pursed lip breathing techniques or other breathing techniques.;To learn and demonstrate proper use of respiratory medications    Long  Term Goals Verbalizes importance of monitoring SPO2 with pulse oximeter and return demonstration;Maintenance of O2 saturations>88%;Exhibits proper breathing techniques, such as pursed lip breathing or other method taught during program session;Compliance with respiratory medication           Oxygen Re-Evaluation:  Oxygen Re-Evaluation    [place] Name 06/16/20 1200             Program Oxygen Prescription   Program Oxygen Prescription None         Home Oxygen   Home Oxygen Device None       Sleep Oxygen Prescription CPAP       Home Exercise Oxygen Prescription None       Home at Rest Exercise Oxygen Prescription None       Compliance with Home Oxygen Use Yes         Goals/Expected Outcomes   Short Term Goals To learn and understand importance of monitoring SPO2 with pulse oximeter and demonstrate accurate use of the pulse  oximeter.;To learn and understand importance of maintaining oxygen saturations>88%;To learn and demonstrate proper pursed lip breathing techniques or other breathing techniques.;To learn and demonstrate proper use of respiratory medications       Long  Term Goals Verbalizes importance of monitoring SPO2 with pulse oximeter and return demonstration;Maintenance of O2 saturations>88%;Exhibits proper breathing techniques, such as pursed lip breathing or other method taught during program session;Compliance with respiratory medication       Goals/Expected Outcomes compliance              Oxygen Discharge (Final Oxygen Re-Evaluation):  Oxygen Re-Evaluation - 06/16/20 1200      Program Oxygen Prescription   Program Oxygen Prescription None      Home Oxygen   Home Oxygen Device None    Sleep Oxygen Prescription CPAP    Home Exercise Oxygen Prescription None    Home at Rest Exercise Oxygen Prescription None    Compliance with Home Oxygen Use Yes      Goals/Expected Outcomes   Short Term Goals To learn and understand importance of monitoring SPO2 with pulse oximeter and demonstrate accurate use of the pulse oximeter.;To learn and understand importance of maintaining oxygen saturations>88%;To learn and demonstrate proper pursed lip breathing techniques or other breathing techniques.;To learn and demonstrate proper use of respiratory medications    Long  Term Goals Verbalizes importance of monitoring SPO2 with pulse oximeter and return demonstration;Maintenance of O2 saturations>88%;Exhibits proper breathing techniques, such as pursed lip breathing or other method taught during program session;Compliance with respiratory medication    Goals/Expected Outcomes compliance           Initial Exercise Prescription:  Initial Exercise Prescription - 06/10/20 1000      Date of Initial Exercise RX and Referring Provider   Date 06/10/20    Referring Provider Brand Males (P)     Expected  Discharge Date 08/06/20 (P)  NuStep   Level 1    SPM 60    Minutes 25    METs 1.5      Prescription Details   Frequency (times per week) 2    Duration Progress to 30 minutes of continuous aerobic without signs/symptoms of physical distress      Intensity   THRR 40-80% of Max Heartrate 56-113    Ratings of Perceived Exertion 11-13    Perceived Dyspnea 0-4      Progression   Progression Continue progressive overload as per policy without signs/symptoms or physical distress.      Resistance Training   Training Prescription Yes    Weight Resistance Band    Reps 10-15           Perform Capillary Blood Glucose checks as needed.  Exercise Prescription Changes:  Exercise Prescription Changes    Row Name 06/16/20 1100             Response to Exercise   Blood Pressure (Admit) 130/80       Blood Pressure (Exercise) 142/62       Blood Pressure (Exit) 128/64       Heart Rate (Admit) 87 bpm       Heart Rate (Exercise) 70 bpm       Heart Rate (Exit) 95 bpm       Oxygen Saturation (Admit) 98 %       Oxygen Saturation (Exercise) 97 %       Oxygen Saturation (Exit) 97 %       Rating of Perceived Exertion (Exercise) 11       Perceived Dyspnea (Exercise) 1       Duration Continue with 30 min of aerobic exercise without signs/symptoms of physical distress.       Intensity --  40-80 % HRR         Progression   Progression Continue to progress workloads to maintain intensity without signs/symptoms of physical distress.         Resistance Training   Training Prescription Yes       Weight Blue Bands       Reps 10-15       Time 10 Minutes         NuStep   Level 2       SPM 80       Minutes 30       METs 1.8              Exercise Comments:  Exercise Comments    Row Name 06/16/20 1204           Exercise Comments Pt completed his first exercise session in pulmonary rehab today. He tolerated exercise well and had no complaints.              Exercise Goals  and Review:  Exercise Goals    Row Name 06/10/20 1046 06/16/20 1201           Exercise Goals   Increase Physical Activity Yes Yes      Intervention Provide advice, education, support and counseling about physical activity/exercise needs.;Develop an individualized exercise prescription for aerobic and resistive training based on initial evaluation findings, risk stratification, comorbidities and participant's personal goals. Provide advice, education, support and counseling about physical activity/exercise needs.;Develop an individualized exercise prescription for aerobic and resistive training based on initial evaluation findings, risk stratification, comorbidities and participant's personal goals.      Expected Outcomes Short Term: Attend rehab on a  regular basis to increase amount of physical activity.;Long Term: Add in home exercise to make exercise part of routine and to increase amount of physical activity.;Long Term: Exercising regularly at least 3-5 days a week. Short Term: Attend rehab on a regular basis to increase amount of physical activity.;Long Term: Add in home exercise to make exercise part of routine and to increase amount of physical activity.;Long Term: Exercising regularly at least 3-5 days a week.      Increase Strength and Stamina Yes Yes      Intervention Provide advice, education, support and counseling about physical activity/exercise needs.;Develop an individualized exercise prescription for aerobic and resistive training based on initial evaluation findings, risk stratification, comorbidities and participant's personal goals. Provide advice, education, support and counseling about physical activity/exercise needs.;Develop an individualized exercise prescription for aerobic and resistive training based on initial evaluation findings, risk stratification, comorbidities and participant's personal goals.      Expected Outcomes Short Term: Increase workloads from initial exercise  prescription for resistance, speed, and METs.;Short Term: Perform resistance training exercises routinely during rehab and add in resistance training at home;Long Term: Improve cardiorespiratory fitness, muscular endurance and strength as measured by increased METs and functional capacity (6MWT) Short Term: Increase workloads from initial exercise prescription for resistance, speed, and METs.;Short Term: Perform resistance training exercises routinely during rehab and add in resistance training at home;Long Term: Improve cardiorespiratory fitness, muscular endurance and strength as measured by increased METs and functional capacity (6MWT)      Able to understand and use rate of perceived exertion (RPE) scale Yes Yes      Intervention Provide education and explanation on how to use RPE scale Provide education and explanation on how to use RPE scale      Expected Outcomes Short Term: Able to use RPE daily in rehab to express subjective intensity level;Long Term:  Able to use RPE to guide intensity level when exercising independently Short Term: Able to use RPE daily in rehab to express subjective intensity level;Long Term:  Able to use RPE to guide intensity level when exercising independently      Able to understand and use Dyspnea scale Yes Yes      Intervention Provide education and explanation on how to use Dyspnea scale Provide education and explanation on how to use Dyspnea scale      Expected Outcomes Short Term: Able to use Dyspnea scale daily in rehab to express subjective sense of shortness of breath during exertion;Long Term: Able to use Dyspnea scale to guide intensity level when exercising independently Short Term: Able to use Dyspnea scale daily in rehab to express subjective sense of shortness of breath during exertion;Long Term: Able to use Dyspnea scale to guide intensity level when exercising independently      Knowledge and understanding of Target Heart Rate Range (THRR) Yes Yes       Intervention Provide education and explanation of THRR including how the numbers were predicted and where they are located for reference Provide education and explanation of THRR including how the numbers were predicted and where they are located for reference      Expected Outcomes Short Term: Able to state/look up THRR;Long Term: Able to use THRR to govern intensity when exercising independently;Short Term: Able to use daily as guideline for intensity in rehab Short Term: Able to state/look up THRR;Long Term: Able to use THRR to govern intensity when exercising independently;Short Term: Able to use daily as guideline for intensity in rehab  Understanding of Exercise Prescription Yes Yes      Intervention Provide education, explanation, and written materials on patient's individual exercise prescription Provide education, explanation, and written materials on patient's individual exercise prescription      Expected Outcomes Short Term: Able to explain program exercise prescription;Long Term: Able to explain home exercise prescription to exercise independently Short Term: Able to explain program exercise prescription;Long Term: Able to explain home exercise prescription to exercise independently             Exercise Goals Re-Evaluation :  Exercise Goals Re-Evaluation    Row Name 06/16/20 1201             Exercise Goal Re-Evaluation   Exercise Goals Review Increase Physical Activity;Increase Strength and Stamina;Able to understand and use rate of perceived exertion (RPE) scale;Able to understand and use Dyspnea scale;Knowledge and understanding of Target Heart Rate Range (THRR);Understanding of Exercise Prescription       Comments Pt has completed 1 exercise session. He exercised at 1.8 METs on the stepper. Will monitor and progress as able.       Expected Outcomes Through exercise at rehab and at home patient will be able to increase physical activity, strength, and stamina.               Discharge Exercise Prescription (Final Exercise Prescription Changes):  Exercise Prescription Changes - 06/16/20 1100      Response to Exercise   Blood Pressure (Admit) 130/80    Blood Pressure (Exercise) 142/62    Blood Pressure (Exit) 128/64    Heart Rate (Admit) 87 bpm    Heart Rate (Exercise) 70 bpm    Heart Rate (Exit) 95 bpm    Oxygen Saturation (Admit) 98 %    Oxygen Saturation (Exercise) 97 %    Oxygen Saturation (Exit) 97 %    Rating of Perceived Exertion (Exercise) 11    Perceived Dyspnea (Exercise) 1    Duration Continue with 30 min of aerobic exercise without signs/symptoms of physical distress.    Intensity --   40-80 % HRR     Progression   Progression Continue to progress workloads to maintain intensity without signs/symptoms of physical distress.      Resistance Training   Training Prescription Yes    Weight Blue Bands    Reps 10-15    Time 10 Minutes      NuStep   Level 2    SPM 80    Minutes 30    METs 1.8           Nutrition:  Target Goals: Understanding of nutrition guidelines, daily intake of sodium <1512m, cholesterol <2077m calories 30% from fat and 7% or less from saturated fats, daily to have 5 or more servings of fruits and vegetables.  Biometrics:  Pre Biometrics - 06/10/20 0945      Pre Biometrics   Grip Strength 30 kg            Nutrition Therapy Plan and Nutrition Goals:   Nutrition Assessments:   Nutrition Goals Re-Evaluation:  Nutrition Goals Re-Evaluation    Row Name 06/16/20 1356             Goals   Current Weight 265 lb 14 oz (120.6 kg)              Nutrition Goals Discharge (Final Nutrition Goals Re-Evaluation):  Nutrition Goals Re-Evaluation - 06/16/20 1356      Goals   Current Weight 265 lb 14 oz (120.6  kg)           Psychosocial: Target Goals: Acknowledge presence or absence of significant depression and/or stress, maximize coping skills, provide positive support system. Participant is  able to verbalize types and ability to use techniques and skills needed for reducing stress and depression.  Initial Review & Psychosocial Screening:  Initial Psych Review & Screening - 06/10/20 0947      Initial Review   Current issues with None Identified      Family Dynamics   Good Support System? Yes      Barriers   Psychosocial barriers to participate in program There are no identifiable barriers or psychosocial needs.      Screening Interventions   Interventions Encouraged to exercise           Quality of Life Scores:  Scores of 19 and below usually indicate a poorer quality of life in these areas.  A difference of  2-3 points is a clinically meaningful difference.  A difference of 2-3 points in the total score of the Quality of Life Index has been associated with significant improvement in overall quality of life, self-image, physical symptoms, and general health in studies assessing change in quality of life.  PHQ-9: Recent Review Flowsheet Data    Depression screen [facility] 2/9 06/10/2020   Decreased Interest 0   Down, Depressed, Hopeless 0   PHQ - 2 Score 0   Altered sleeping 0   Tired, decreased energy 0   Change in appetite 0   Feeling bad or failure about yourself  0   Trouble concentrating 0   Moving slowly or fidgety/restless 0   Suicidal thoughts 0   Difficult doing work/chores Not difficult at all     Interpretation of Total Score  Total Score Depression Severity:  1-4 = Minimal depression, 5-9 = Mild depression, 10-14 = Moderate depression, 15-19 = Moderately severe depression, 20-27 = Severe depression   Psychosocial Evaluation and Intervention:  Psychosocial Evaluation - 06/10/20 0948      Psychosocial Evaluation & Interventions   Interventions Encouraged to exercise with the program and follow exercise prescription    Comments No barriers or psychosocial concerns identified at this time    Expected Outcomes For patient to continue to have no barriers  or concerns while in pulmonary rehab    Continue Psychosocial Services  No Follow up required           Psychosocial Re-Evaluation:  Psychosocial Re-Evaluation    [place] Name 06/16/20 1157             Psychosocial Re-Evaluation   Current issues with None Identified       Comments No concerns identified       Expected Outcomes For Shane [name] to be free of psychosocial concerns and barriers while participating in pulmonary rehab.       Interventions Encouraged to attend Pulmonary Rehabilitation for the exercise       Continue Psychosocial Services  No Follow up required              Psychosocial Discharge (Final Psychosocial Re-Evaluation):  Psychosocial Re-Evaluation - 06/16/20 1157      Psychosocial Re-Evaluation   Current issues with None Identified    Comments No concerns identified    Expected Outcomes For Shane [name] to be free of psychosocial concerns and barriers while participating in pulmonary rehab.    Interventions Encouraged to attend Pulmonary Rehabilitation for the exercise    Continue Psychosocial Services  No Follow up  required           Education: Education Goals: Education classes will be provided on a weekly basis, covering required topics. Participant will state understanding/return demonstration of topics presented.  Learning Barriers/Preferences:  Learning Barriers/Preferences - 06/10/20 0949      Learning Barriers/Preferences   Learning Barriers None    Learning Preferences Computer/Internet;Group Instruction;Individual Instruction;Audio;Pictoral;Skilled Demonstration;Verbal Instruction;Video;Written Material           Education Topics: Risk Factor Reduction:  -Group instruction that is supported by a PowerPoint presentation. Instructor discusses the definition of a risk factor, different risk factors for pulmonary disease, and how the heart and lungs work together.     Nutrition for Pulmonary Patient:  -Group instruction provided by PowerPoint slides,  verbal discussion, and written materials to support subject matter. The instructor gives an explanation and review of healthy diet recommendations, which includes a discussion on weight management, recommendations for fruit and vegetable consumption, as well as protein, fluid, caffeine, fiber, sodium, sugar, and alcohol. Tips for eating when patients are short of breath are discussed.   PULMONARY REHAB OTHER RESPIRATORY from 08/15/2017 in [place]  Date 07/27/17  Educator RD  Instruction Review Code (Retired) 2- meets goals/outcomes      Pursed Lip Breathing:  -Group instruction that is supported by demonstration and informational handouts. Instructor discusses the benefits of pursed lip and diaphragmatic breathing and detailed demonstration on how to preform both.     PULMONARY REHAB OTHER RESPIRATORY from 08/15/2017 in [place]  Date 08/10/17  Educator RT  Instruction Review Code (Retired) 2- meets goals/outcomes      Oxygen Safety:  -Group instruction provided by PowerPoint, verbal discussion, and written material to support subject matter. There is an overview of "What is Oxygen" and "Why do we need it".  Instructor also reviews how to create a safe environment for oxygen use, the importance of using oxygen as prescribed, and the risks of noncompliance. There is a brief discussion on traveling with oxygen and resources the patient may utilize.   PULMONARY REHAB OTHER RESPIRATORY from 08/15/2017 in [place]  Date 07/13/17  Educator Truddie Crumble  Instruction Review Code (Retired) R- Review/reinforce      Oxygen Equipment:  -Group instruction provided by [employer], Network engineer, and Insurance underwriter.   PULMONARY REHAB OTHER RESPIRATORY from 08/15/2017 in [place]  Date 04/27/17  Educator George[employer]  Instruction Review  Code (Retired) 2- meets goals/outcomes      Signs and Symptoms:  -Group instruction provided by written material and verbal discussion to support subject matter. Warning signs and symptoms of infection, stroke, and heart attack are reviewed and when to call the physician/911 reinforced. Tips for preventing the spread of infection discussed.   PULMONARY REHAB OTHER RESPIRATORY from 08/15/2017 in [place]  Date 06/15/17  Educator rn  Instruction Review Code (Retired) 2- meets goals/outcomes      Advanced Directives:  -Group instruction provided by verbal instruction and written material to support subject matter. Instructor reviews Advanced Directive laws and proper instruction for filling out document.   Pulmonary Video:  -Group video education that reviews the importance of medication and oxygen compliance, exercise, good nutrition, pulmonary hygiene, and pursed lip and diaphragmatic breathing for the pulmonary patient.   Exercise for the Pulmonary Patient:  -Group instruction that is supported by a PowerPoint presentation. Instructor discusses benefits  of exercise, core components of exercise, frequency, duration, and intensity of an exercise routine, importance of utilizing pulse oximetry during exercise, safety while exercising, and options of places to exercise outside of rehab.     PULMONARY REHAB OTHER RESPIRATORY from 08/15/2017 in [place]  Date 08/15/17  Educator ep  Instruction Review Code (Retired) R- Review/reinforce      Pulmonary Medications:  -Verbally interactive group education provided by instructor with focus on inhaled medications and proper administration.   PULMONARY REHAB OTHER RESPIRATORY from 08/15/2017 in [place]  Date 08/03/17  Educator Pharm  Instruction Review Code (Retired) R- Product/process development scientist and Physiology of the Respiratory System and  Intimacy:  -Group instruction provided by PowerPoint, verbal discussion, and written material to support subject matter. Instructor reviews respiratory cycle and anatomical components of the respiratory system and their functions. Instructor also reviews differences in obstructive and restrictive respiratory diseases with examples of each. Intimacy, Sex, and Sexuality differences are reviewed with a discussion on how relationships can change when diagnosed with pulmonary disease. Common sexual concerns are reviewed.   PULMONARY REHAB OTHER RESPIRATORY from 08/15/2017 in [place]  Date 06/22/17  Educator RN  Instruction Review Code (Retired) 2- meets goals/outcomes      MD DAY -A group question and answer session with a medical doctor that allows participants to ask questions that relate to their pulmonary disease state.   PULMONARY REHAB OTHER RESPIRATORY from 08/15/2017 in [place]  Date 06/08/17  Educator Nelda Marseille  Instruction Review Code (Retired) 2- meets goals/outcomes      OTHER EDUCATION -Group or individual verbal, written, or video instructions that support the educational goals of the pulmonary rehab program.   Holiday Eating Survival Tips:  -Group instruction provided by PowerPoint slides, verbal discussion, and written materials to support subject matter. The instructor gives patients tips, tricks, and techniques to help them not only survive but enjoy the holidays despite the onslaught of food that accompanies the holidays.   Knowledge Questionnaire Score:  Knowledge Questionnaire Score - 06/10/20 0957      Knowledge Questionnaire Score   Pre Score 15/18           Core Components/Risk Factors/Patient Goals at Admission:  Personal Goals and Risk Factors at Admission - 06/10/20 0950      Core Components/Risk Factors/Patient Goals on Admission   Improve shortness of breath with ADL's Yes    Intervention  Provide education, individualized exercise plan and daily activity instruction to help decrease symptoms of SOB with activities of daily living.    Expected Outcomes Short Term: Improve cardiorespiratory fitness to achieve a reduction of symptoms when performing ADLs;Long Term: Be able to perform more ADLs without symptoms or delay the onset of symptoms           Core Components/Risk Factors/Patient Goals Review:   Goals and Risk Factor Review    Row Name 06/10/20 0950 06/16/20 1158           Core Components/Risk Factors/Patient Goals Review   Personal Goals Review Improve shortness of breath with ADL's;Develop more efficient breathing techniques such as purse lipped breathing and diaphragmatic breathing and practicing self-pacing with activity.;Increase knowledge of respiratory medications and ability to use respiratory devices properly. Increase knowledge of respiratory medications and ability to use respiratory devices properly.;Improve shortness of breath with ADL's;Develop more efficient breathing techniques such as purse lipped  breathing and diaphragmatic breathing and practicing self-pacing with activity.      Review -- Today was his first day of exercise, he did well, too early to have met any goals.      Expected Outcomes -- See admission goals.             Core Components/Risk Factors/Patient Goals at Discharge (Final Review):   Goals and Risk Factor Review - 06/16/20 1158      Core Components/Risk Factors/Patient Goals Review   Personal Goals Review Increase knowledge of respiratory medications and ability to use respiratory devices properly.;Improve shortness of breath with ADL's;Develop more efficient breathing techniques such as purse lipped breathing and diaphragmatic breathing and practicing self-pacing with activity.    Review Today was his first day of exercise, he did well, too early to have met any goals.    Expected Outcomes See admission goals.           ITP  Comments:   Comments: ITP REVIEW Pt is making expected progress toward pulmonary rehab goals after completing 1 sessions. Recommend continued exercise, life style modification, education, and utilization of breathing techniques to increase stamina and strength and decrease shortness of breath with exertion.

## 2020-06-22 ENCOUNTER — Ambulatory Visit (HOSPITAL_COMMUNITY)
Admission: RE | Admit: 2020-06-22 | Discharge: 2020-06-22 | Disposition: A | Discharge status: Home / Self Care | Payer: Medicare Other | Source: Ambulatory Visit | Attending: Internal Medicine | Admitting: Internal Medicine

## 2020-06-22 ENCOUNTER — Other Ambulatory Visit: Payer: Self-pay

## 2020-06-22 MED ORDER — STUDY - FIBROGEN - PAMREVLUMAB 10 MG/ML IV INFUSION (OPEN LABEL) (PI-RAMASWAMY)
30.0000 mg/kg | Freq: Once | INTRAVENOUS | Status: AC
  Administered 2020-06-22: 3670 mg via INTRAVENOUS
  Filled 2020-06-22: qty 367
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
## 2020-06-23 ENCOUNTER — Encounter (HOSPITAL_COMMUNITY)
Admission: RE | Admit: 2020-06-23 | Discharge: 2020-06-23 | Disposition: A | Discharge status: Home / Self Care | Payer: Medicare Other | Source: Ambulatory Visit | Attending: Internal Medicine | Admitting: Internal Medicine

## 2020-06-23 ENCOUNTER — Other Ambulatory Visit: Payer: Self-pay

## 2020-06-23 DIAGNOSIS — J84112 Idiopathic pulmonary fibrosis: Secondary | ICD-10-CM

## 2020-06-23 NOTE — Progress Notes (Signed)
I have reviewed a Home Exercise Prescription with Shane [name] . Shane [name] is not currently exercising at home.  The patient was advised to walk, use resistance bands, and do stretches 1 days a week for 30 minutes.  Shane [name] and I discussed how to progress their exercise prescription.  The patient stated that their goals were to increase walking capabilities.  The patient stated that they understand the exercise prescription.  We reviewed exercise guidelines, target heart rate during exercise, RPE Scale, weather conditions, NTG use, endpoints for exercise, warmup and cool down.  Patient is encouraged to come to me with any questions. I will continue to follow up with the patient to assist them with progression and safety.

## 2020-06-23 NOTE — Progress Notes (Signed)
Daily Session Note  Patient Details  Name: Shane [name] MRN: [id number] Date of Birth: 10/02/1941 Referring Provider:     Pulmonary Rehab Walk Test from 06/10/2020 in [place]  Referring Provider Shane [name] ([place])       Encounter Date: 06/23/2020  Check In:  Session Check In - 06/23/20 0944      Check-In   Supervising physician immediately available to respond to emergencies [facility] immediately available    Physician(s) Dr. Pietro Cassis    Location [facility]    Staff Present Rodney Langton, RN;Dalton Kris Mouton, MS, CEP, Exercise Physiologist;Jessica Hassell Done, MS, ACSM-CEP, Exercise Physiologist    Virtual Visit No    Medication changes reported     No    Fall or balance concerns reported    No    Tobacco Cessation No Change    Warm-up and Cool-down Performed on first and last piece of equipment    Resistance Training Performed Yes    VAD Patient? No    PAD/SET Patient? No      Pain Assessment   Currently in Pain? No/denies    Multiple Pain Sites No           Capillary Blood Glucose: No results found for this or any previous visit (from the past 24 hour(s)).    Social History   Tobacco Use  Smoking Status Never Smoker  Smokeless Tobacco Never Used    Goals Met:  Proper associated with RPD/PD & O2 Sat Exercise tolerated well Strength training completed today  Goals Unmet:  Not Applicable  Comments: Service time is from [place] to [place]   Dr. Fransico Him is Medical Director for Cardiac Rehab at [facility].

## 2020-06-25 ENCOUNTER — Encounter (HOSPITAL_COMMUNITY)
Admission: RE | Admit: 2020-06-25 | Discharge: 2020-06-25 | Disposition: A | Discharge status: Home / Self Care | Payer: Medicare Other | Source: Ambulatory Visit | Attending: Internal Medicine | Admitting: Internal Medicine

## 2020-06-25 ENCOUNTER — Other Ambulatory Visit: Payer: Self-pay

## 2020-06-25 DIAGNOSIS — J84112 Idiopathic pulmonary fibrosis: Secondary | ICD-10-CM | POA: Diagnosis not present

## 2020-06-25 NOTE — Progress Notes (Signed)
Daily Session Note  Patient Details  Name: Shane [name] MRN: [id number] Date of Birth: [birth year] Referring Provider:     Pulmonary Rehab Walk Test from 06/10/2020 in [place]  Referring Provider Shane [name] ([place])       Encounter Date: 06/25/2020  Check In:  Session Check In - 06/25/20 0951      Check-In   Supervising physician immediately available to respond to emergencies [facility] immediately available    Physician(s) Dr. Pietro Cassis    [facility]    Staff Present Rosebud Poles, RN, Bjorn Loser, MS, CEP, Exercise Physiologist;Lisa Jani Gravel, MS, ACSM-CEP, Exercise Physiologist    Virtual Visit No    Medication changes reported     No    Fall or balance concerns reported    No    Tobacco Cessation No Change    Warm-up and Cool-down Performed on first and last piece of equipment    Resistance Training Performed Yes    VAD Patient? No    PAD/SET Patient? No      Pain Assessment   Currently in Pain? No/denies    Multiple Pain Sites No           Capillary Blood Glucose: No results found for this or any previous visit (from the past 24 hour(s)).    Social History   Tobacco Use  Smoking Status Never Smoker  Smokeless Tobacco Never Used    Goals Met:  Independence with exercise equipment Exercise tolerated well No report of cardiac concerns or symptoms Strength training completed today  Goals Unmet:  Not Applicable  Comments: Service time is from 0945 to 18    Dr. Fransico Him is Medical Director for Cardiac Rehab at [facility].

## 2020-06-29 ENCOUNTER — Ambulatory Visit (INDEPENDENT_AMBULATORY_CARE_PROVIDER_SITE_OTHER): Payer: Medicare Other | Admitting: *Deleted

## 2020-06-29 DIAGNOSIS — I459 Conduction disorder, unspecified: Secondary | ICD-10-CM

## 2020-06-29 LAB — CUP PACEART REMOTE DEVICE CHECK
Date Time Interrogation Session: 20210726085429
Implantable Lead Implant Date: 20210426
Implantable Lead Implant Date: 20210426
Implantable Lead Location: 753859
Implantable Lead Location: 753860
Implantable Lead Model: 377171
Implantable Lead Model: 377171
Implantable Lead Serial Number: 7000191418
Implantable Lead Serial Number: 7000203411
Implantable Pulse Generator Implant Date: 20210426
Pulse Gen Model: 407145
Pulse Gen Serial Number: 69858440

## 2020-06-30 ENCOUNTER — Encounter: Payer: Self-pay | Admitting: Internal Medicine

## 2020-06-30 ENCOUNTER — Ambulatory Visit (INDEPENDENT_AMBULATORY_CARE_PROVIDER_SITE_OTHER): Payer: Medicare Other | Admitting: Internal Medicine

## 2020-06-30 ENCOUNTER — Encounter: Payer: Self-pay | Admitting: *Deleted

## 2020-06-30 ENCOUNTER — Other Ambulatory Visit: Payer: Self-pay

## 2020-06-30 ENCOUNTER — Encounter (HOSPITAL_COMMUNITY)
Admission: RE | Admit: 2020-06-30 | Discharge: 2020-06-30 | Disposition: A | Discharge status: Home / Self Care | Payer: Medicare Other | Source: Ambulatory Visit | Attending: Internal Medicine | Admitting: Internal Medicine

## 2020-06-30 VITALS — Wt 267.6 lb

## 2020-06-30 VITALS — BP 128/58 | HR 72 | Ht 72.0 in | Wt 274.0 lb

## 2020-06-30 DIAGNOSIS — I459 Conduction disorder, unspecified: Secondary | ICD-10-CM

## 2020-06-30 DIAGNOSIS — Z950 Presence of cardiac pacemaker: Secondary | ICD-10-CM

## 2020-06-30 DIAGNOSIS — J84112 Idiopathic pulmonary fibrosis: Secondary | ICD-10-CM

## 2020-06-30 DIAGNOSIS — I209 Angina pectoris, unspecified: Secondary | ICD-10-CM | POA: Diagnosis not present

## 2020-06-30 NOTE — Patient Instructions (Addendum)
Medication Instructions:  Your physician recommends that you continue on your current medications as directed. Please refer to the Current Medication list given to you today.  *If you need a refill on your cardiac medications before your next appointment, please call your pharmacy*  Lab Work: CBC, BMP  If you have labs (blood work) drawn today and your tests are completely normal, you will receive your results only by: Marland Kitchen MyChart Message (if you have MyChart) OR . A paper copy in the mail If you have any lab test that is abnormal or we need to change your treatment, we will call you to review the results.  Testing/Procedures: Heart cath  Follow-Up:   See instruction letter

## 2020-06-30 NOTE — H&P (View-Only) (Signed)
HPI Shane [name] returns today for followup. 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 He is known to have pulmonary fibrosis. Over the past few weeks, he has noted progressive anginal symptoms. With minimal exertion he develops chest pain and this resolves with rest. It is reproducible and occurs with walking or any strenuous activity. He has not had syncope. No edema.  Allergies  Allergen Reactions  . Codeine Hives and Itching  . Other Diarrhea and Other (See Comments)    Esbriet causes elevated LFTs. D/C on 06/14/17 and SEVERE DIARRHEA  . Ofev [Nintedanib] Diarrhea    SEVERE DIARRHEA     Current Outpatient Medications  Medication Sig Dispense Refill  . albuterol (PROAIR HFA) 108 (90 Base) MCG/ACT inhaler Inhale 2 puffs into the lungs every 4 (four) hours as needed for wheezing or shortness of breath.    . Ascorbic Acid (VITAMIN C) 1000 MG tablet Take 1,000 mg by mouth daily.    Marland Kitchen aspirin EC 81 MG tablet Take 81 mg by mouth daily.    . clotrimazole-betamethasone (LOTRISONE) cream Apply 1 application topically daily as needed (for skin infection).   0  . Continuous Blood Gluc Sensor (FREESTYLE LIBRE 14 DAY SENSOR) MISC 1 patch every 14 (fourteen) days.    Marland Kitchen EPINEPHrine 0.3 mg/0.3 mL IJ SOAJ injection Inject 0.3 mg into the muscle once as needed for anaphylaxis.   0  . furosemide (LASIX) 40 MG tablet Take 20 mg by mouth daily.     . insulin detemir (LEVEMIR) 100 UNIT/ML injection Inject 12 Units into the skin at bedtime.     . insulin lispro (HUMALOG) 100 UNIT/ML injection Inject 40 Units into the skin See admin instructions. Use as directed with V-Go 40 insulin pump    . ketotifen (ZADITOR) 0.025 % ophthalmic solution Place 1 drop into both eyes 2 (two) times daily as needed (pollen allergies).    . LORazepam (ATIVAN) 1 MG tablet Take 1 mg by mouth at bedtime as needed for sleep.     Marland Kitchen losartan (COZAAR) 50 MG tablet Take 50 mg by mouth daily.    .  Magnesium Oxide 400 MG CAPS Take 1 capsule (400 mg total) by mouth daily. 30 capsule 3  . metoprolol succinate (TOPROL XL) 25 MG 24 hr tablet Take 1 tablet (25 mg total) by mouth daily. 30 tablet 11  . Multiple Vitamin (MULTIVITAMIN WITH MINERALS) TABS tablet Take 1 tablet by mouth daily.    . pantoprazole (PROTONIX) 40 MG tablet Take 40 mg by mouth daily.      . potassium chloride SA (KLOR-CON) 20 MEQ tablet Take 20 mEq by mouth 2 (two) times daily.     No current facility-administered medications for this visit.     Past Medical History:  Diagnosis Date  . 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 Cataract   . Chronic kidney disease    d/t DM  . CKD (chronic kidney disease), stage III   . Diabetes mellitus    Vgo disposal insulin bolus  simular to insulin pump  . Dyspnea   . GERD (gastroesophageal reflux disease)   . Hyperlipidemia   . Hypertension   . Idiopathic pulmonary fibrosis ([place]) 11/2016  . ILD (interstitial lung disease) ([place])   . Moderate aortic stenosis    a. 10/2019 Echo: EF 55-60%, Gr2 DD. Nl RV.   Marland Kitchen Neuromuscular disorder ([place])   . Nonobstructive CAD (coronary artery  disease)    a. 2012 Cath: mod, nonobs dzs; b. 10/2016 MV: EF 60%, no ischemia.  Marland Kitchen PONV (postoperative nausea and vomiting)   . Sleep apnea     uses cpap asked to bring mask and tubing    ROS:   All systems reviewed and negative except as noted in the HPI.   Past Surgical History:  Procedure Laterality Date  . ANTERIOR FUSION CERVICAL SPINE  2012  . CARDIAC CATHETERIZATION  2011  . CARDIAC CATHETERIZATION N/A 11/09/2016   Procedure: Right Heart Cath;  Surgeon: Belva Crome, MD;  Location: [facility];  Service: Cardiovascular;  Laterality: N/A;  . carpel tunnel     left wrist  . CATARACT EXTRACTION    . CATARACT EXTRACTION W/ INTRAOCULAR LENS  IMPLANT, BILATERAL  2013  . CERVICAL LAMINECTOMY  2012  . COLONOSCOPY N/A 01/14/2013   Procedure: COLONOSCOPY;  Surgeon: Irene Shipper, MD;  Location: [facility]  END[facility]COPY;  Service: Endoscopy;  Laterality: N/A;  . EYE SURGERY    . [place]   left  . LUMBAR LAMINECTOMY  2003  . LUNG BIOPSY Left 12/26/2016   Procedure: LUNG BIOPSY;  Surgeon: [name], MD;  Location: [name];  Service: Thoracic;  Laterality: Left;  . PACEMAKER IMPLANT N/A 03/30/2020   Procedure: PACEMAKER IMPLANT;  Surgeon: Evans Lance, MD;  Location: [facility];  Service: Cardiovascular;  Laterality: N/A;  . POSTERIOR FUSION CERVICAL SPINE  2012  . TRIGGER FINGER RELEASE  2011   4th finger left hand  . VIDEO ASSISTED THORACOSCOPY Left 12/26/2016   Procedure: VIDEO ASSISTED THORACOSCOPY;  Surgeon: [name], MD;  Location: [facility];  Service: Thoracic;  Laterality: Left;  Marland Kitchen VIDEO BRONCHOSCOPY N/A 12/26/2016   Procedure: VIDEO BRONCHOSCOPY;  Surgeon: [name], MD;  Location[facility];  Service: Thoracic;  Laterality: N/A;     Family History  Problem Relation Age of Onset  . Diabetes Mellitus II Mother   . Emphysema Father 19  . Colon cancer Neg Hx   . Esophageal cancer Neg Hx   . Rectal cancer Neg Hx   . Stomach cancer Neg Hx      Social History   Socioeconomic History  . Marital status: Married    Spouse name: Not on file  . Number of children: Not on file  . Years of education: Not on file  . Highest education level: Not on file  Occupational History  . Not on file  Tobacco Use  . Smoking status: Never Smoker  . Smokeless tobacco: Never Used  Vaping Use  . Vaping Use: Never used  Substance and Sexual Activity  . Alcohol use: No  . Drug use: No  . Sexual activity: Not Currently  Other Topics Concern  . 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    Social Determinants of Health   Financial Resource Strain:   . Difficulty of Paying Living Expenses:   Food Insecurity:   . Worried About Charity fundraiser in the Last Year:   . Arboriculturist in the Last Year:   Transportation  Needs:   . Film/video editor (Medical):   Marland Kitchen Lack of Transportation (Non-Medical):   Physical Activity:   . Days of Exercise per Week:   . Minutes of Exercise per Session:   Stress:   . Feeling of Stress :   Social Connections:   .  Frequency of Communication with Friends and Family:   . Frequency of Social Gatherings with Friends and Family:   . Attends Religious Services:   . Active Member of Clubs or Organizations:   . Attends Archivist Meetings:   Marland Kitchen Marital Status:   Intimate Partner Violence:   . Fear of Current or Ex-Partner:   . Emotionally Abused:   Marland Kitchen Physically Abused:   . Sexually Abused:      BP (!) 128/58   Pulse 72   Ht 6' (1.829 m)   Wt (!) 274 lb (124.3 kg)   SpO2 94%   BMI 37.16 kg/m   Physical Exam:  Well appearing NAD HEENT: Unremarkable Neck:  No JVD, no thyromegally Lymphatics:  No adenopathy Back:  No CVA tenderness Lungs:  Clear with no wheezes HEART:  Regular rate rh ythm, no murmurs, no rubs, no clicks Abd:  soft, positive bowel sounds, no organomegally, no rebound, no guarding Ext:  2 plus pulses, no edema, no cyanosis, no clubbing Skin:  No rashes no nodules Neuro:  CN II through XII intact, motor grossly intact  DEVICE  Normal device function.  See PaceArt for details.   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 I have recommended he undergo left heart cath. His last cath 9 years ago showed 60% lesions.  2. Syncope - he has not had any since his PPM was placed.  3. Pulmonary fibrosis - he is being treated by Dr. Chase Caller.  4. 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 We will recheck in several months.  Mikle Bosworth.D.

## 2020-06-30 NOTE — Progress Notes (Signed)
HPI Mr. Zwiefelhofer returns today for followup. 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 He is known to have pulmonary fibrosis. Over the past few weeks, he has noted progressive anginal symptoms. With minimal exertion he develops chest pain and this resolves with rest. It is reproducible and occurs with walking or any strenuous activity. He has not had syncope. No edema.  Allergies  Allergen Reactions  . Codeine Hives and Itching  . Other Diarrhea and Other (See Comments)    Esbriet causes elevated LFTs. D/C on 06/14/17 and SEVERE DIARRHEA  . Ofev [Nintedanib] Diarrhea    SEVERE DIARRHEA     Current Outpatient Medications  Medication Sig Dispense Refill  . albuterol (PROAIR HFA) 108 (90 Base) MCG/ACT inhaler Inhale 2 puffs into the lungs every 4 (four) hours as needed for wheezing or shortness of breath.    . Ascorbic Acid (VITAMIN C) 1000 MG tablet Take 1,000 mg by mouth daily.    Marland Kitchen aspirin EC 81 MG tablet Take 81 mg by mouth daily.    . clotrimazole-betamethasone (LOTRISONE) cream Apply 1 application topically daily as needed (for skin infection).   0  . Continuous Blood Gluc Sensor (FREESTYLE LIBRE 14 DAY SENSOR) MISC 1 patch every 14 (fourteen) days.    Marland Kitchen EPINEPHrine 0.3 mg/0.3 mL IJ SOAJ injection Inject 0.3 mg into the muscle once as needed for anaphylaxis.   0  . furosemide (LASIX) 40 MG tablet Take 20 mg by mouth daily.     . insulin detemir (LEVEMIR) 100 UNIT/ML injection Inject 12 Units into the skin at bedtime.     . insulin lispro (HUMALOG) 100 UNIT/ML injection Inject 40 Units into the skin See admin instructions. Use as directed with V-Go 40 insulin pump    . ketotifen (ZADITOR) 0.025 % ophthalmic solution Place 1 drop into both eyes 2 (two) times daily as needed (pollen allergies).    . LORazepam (ATIVAN) 1 MG tablet Take 1 mg by mouth at bedtime as needed for sleep.     Marland Kitchen losartan (COZAAR) 50 MG tablet Take 50 mg by mouth daily.    .  Magnesium Oxide 400 MG CAPS Take 1 capsule (400 mg total) by mouth daily. 30 capsule 3  . metoprolol succinate (TOPROL XL) 25 MG 24 hr tablet Take 1 tablet (25 mg total) by mouth daily. 30 tablet 11  . Multiple Vitamin (MULTIVITAMIN WITH MINERALS) TABS tablet Take 1 tablet by mouth daily.    . pantoprazole (PROTONIX) 40 MG tablet Take 40 mg by mouth daily.      . potassium chloride SA (KLOR-CON) 20 MEQ tablet Take 20 mEq by mouth 2 (two) times daily.     No current facility-administered medications for this visit.     Past Medical History:  Diagnosis Date  . 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 Cataract   . Chronic kidney disease    d/t DM  . CKD (chronic kidney disease), stage III   . Diabetes mellitus    Vgo disposal insulin bolus  simular to insulin pump  . Dyspnea   . GERD (gastroesophageal reflux disease)   . Hyperlipidemia   . Hypertension   . Idiopathic pulmonary fibrosis ([place]) 11/2016  . ILD (interstitial lung disease) ([place])   . Moderate aortic stenosis    a. 10/2019 Echo: EF 55-60%, Gr2 DD. Nl RV.   Marland Kitchen Neuromuscular disorder ([place])   . Nonobstructive CAD (coronary artery  disease)    a. 2012 Cath: mod, nonobs dzs; b. 10/2016 MV: EF 60%, no ischemia.  Marland Kitchen PONV (postoperative nausea and vomiting)   . Sleep apnea     uses cpap asked to bring mask and tubing    ROS:   All systems reviewed and negative except as noted in the HPI.   Past Surgical History:  Procedure Laterality Date  . ANTERIOR FUSION CERVICAL SPINE  2012  . CARDIAC CATHETERIZATION  2011  . CARDIAC CATHETERIZATION N/A 11/09/2016   Procedure: Right Heart Cath;  Surgeon: Belva Crome, MD;  Location: [facility];  Service: Cardiovascular;  Laterality: N/A;  . carpel tunnel     left wrist  . CATARACT EXTRACTION    . CATARACT EXTRACTION W/ INTRAOCULAR LENS  IMPLANT, BILATERAL  2013  . CERVICAL LAMINECTOMY  2012  . COLONOSCOPY N/A 01/14/2013   Procedure: COLONOSCOPY;  Surgeon: Irene Shipper, MD;  Location: [facility];  Service: Endoscopy;  Laterality: N/A;  . EYE SURGERY    . [place]   left  . LUMBAR LAMINECTOMY  2003  . LUNG BIOPSY Left 12/26/2016   Procedure: LUNG BIOPSY;  Surgeon: [name], MD;  Location: [facility];  Service: Thoracic;  Laterality: Left;  . PACEMAKER IMPLANT N/A 03/30/2020   Procedure: PACEMAKER IMPLANT;  Surgeon: Evans Lance, MD;  Location: [facility];  Service: Cardiovascular;  Laterality: N/A;  . POSTERIOR FUSION CERVICAL SPINE  2012  . TRIGGER FINGER RELEASE  2011   4th finger left hand  . VIDEO ASSISTED THORACOSCOPY Left 12/26/2016   Procedure: VIDEO ASSISTED THORACOSCOPY;  Surgeon: [name], MD;  Location: [facility];  Service: Thoracic;  Laterality: Left;  Marland Kitchen VIDEO BRONCHOSCOPY N/A 12/26/2016   Procedure: VIDEO BRONCHOSCOPY;  Surgeon: [name], MD;  Location: [facility] OR;  Service: Thoracic;  Laterality: N/A;     Family History  Problem Relation Age of Onset  . Diabetes Mellitus II Mother   . Emphysema Father 57  . Colon cancer Neg Hx   . Esophageal cancer Neg Hx   . Rectal cancer Neg Hx   . Stomach cancer Neg Hx      Social History   Socioeconomic History  . Marital status: Married    Spouse name: Not on file  . Number of children: Not on file  . Years of education: Not on file  . Highest education level: Not on file  Occupational History  . Not on file  Tobacco Use  . Smoking status: Never Smoker  . Smokeless tobacco: Never Used  Vaping Use  . Vaping Use: Never used  Substance and Sexual Activity  . Alcohol use: No  . Drug use: No  . Sexual activity: Not Currently  Other Topics Concern  . 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    Social Determinants of Health   Financial Resource Strain:   . Difficulty of Paying Living Expenses:   Food Insecurity:   . Worried About Charity fundraiser in the Last Year:   . Arboriculturist in the Last Year:   Transportation  Needs:   . Film/video editor (Medical):   Marland Kitchen Lack of Transportation (Non-Medical):   Physical Activity:   . Days of Exercise per Week:   . Minutes of Exercise per Session:   Stress:   . Feeling of Stress :   Social Connections:   .  Frequency of Communication with Friends and Family:   . Frequency of Social Gatherings with Friends and Family:   . Attends Religious Services:   . Active Member of Clubs or Organizations:   . Attends Archivist Meetings:   Marland Kitchen Marital Status:   Intimate Partner Violence:   . Fear of Current or Ex-Partner:   . Emotionally Abused:   Marland Kitchen Physically Abused:   . Sexually Abused:      BP (!) 128/58   Pulse 72   Ht 6' (1.829 m)   Wt (!) 274 lb (124.3 kg)   SpO2 94%   BMI 37.16 kg/m   Physical Exam:  Well appearing NAD HEENT: Unremarkable Neck:  No JVD, no thyromegally Lymphatics:  No adenopathy Back:  No CVA tenderness Lungs:  Clear with no wheezes HEART:  Regular rate rh ythm, no murmurs, no rubs, no clicks Abd:  soft, positive bowel sounds, no organomegally, no rebound, no guarding Ext:  2 plus pulses, no edema, no cyanosis, no clubbing Skin:  No rashes no nodules Neuro:  CN II through XII intact, motor grossly intact  DEVICE  Normal device function.  See PaceArt for details.   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 I have recommended he undergo left heart cath. His last cath 9 years ago showed 60% lesions.  2. Syncope - he has not had any since his PPM was placed.  3. Pulmonary fibrosis - he is being treated by Dr. Chase Caller.  4. 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 We will recheck in several months.  Mikle Bosworth.D.

## 2020-06-30 NOTE — Progress Notes (Signed)
Shane [name] 79 y.o. male Nutrition Note   Visit Diagnosis: Idiopathic pulmonary fibrosis ([place])  Past Medical History:  Diagnosis Date  . 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 Cataract   . Chronic kidney disease    d/t DM  . CKD (chronic kidney disease), stage III   . Diabetes mellitus    Vgo disposal insulin bolus  simular to insulin pump  . Dyspnea   . GERD (gastroesophageal reflux disease)   . Hyperlipidemia   . Hypertension   . Idiopathic pulmonary fibrosis ([place]) 11/2016  . ILD (interstitial lung disease) ([place])   . Moderate aortic stenosis    a. 10/2019 Echo: EF 55-60%, Gr2 DD. Nl RV.   Marland Kitchen Neuromuscular disorder ([place])   . Nonobstructive CAD (coronary artery disease)    a. 2012 Cath: mod, nonobs dzs; b. 10/2016 MV: EF 60%, no ischemia.  Marland Kitchen PONV (postoperative nausea and vomiting)   . Sleep apnea     uses cpap asked to bring mask and tubing     Medications reviewed.   Current Outpatient Medications:  .  albuterol (PROAIR HFA) 108 (90 Base) MCG/ACT inhaler, Inhale 2 puffs into the lungs every 4 (four) hours as needed for wheezing or shortness of breath., Disp: , Rfl:  .  Ascorbic Acid (VITAMIN C) 1000 MG tablet, Take 1,000 mg by mouth daily., Disp: , Rfl:  .  aspirin EC 81 MG tablet, Take 81 mg by mouth daily., Disp: , Rfl:  .  clotrimazole-betamethasone (LOTRISONE) cream, Apply 1 application topically daily as needed (for skin infection). , Disp: , Rfl: 0 .  Continuous Blood Gluc Sensor (FREESTYLE LIBRE 14 DAY SENSOR) MISC, 1 patch every 14 (fourteen) days., Disp: , Rfl:  .  EPINEPHrine 0.3 mg/0.3 mL IJ SOAJ injection, Inject 0.3 mg into the muscle once as needed for anaphylaxis. , Disp: , Rfl: 0 .  furosemide (LASIX) 40 MG tablet, Take 20 mg by mouth daily. , Disp: , Rfl:  .  insulin detemir (LEVEMIR) 100 UNIT/ML injection, Inject 12 Units into the skin at bedtime. , Disp: , Rfl:  .  insulin lispro (HUMALOG) 100 UNIT/ML injection, Inject 40 Units into the skin See admin  instructions. Use as directed with V-Go 40 insulin pump, Disp: , Rfl:  .  ketotifen (ZADITOR) 0.025 % ophthalmic solution, Place 1 drop into both eyes 2 (two) times daily as needed (pollen allergies)., Disp: , Rfl:  .  LORazepam (ATIVAN) 1 MG tablet, Take 1 mg by mouth at bedtime as needed for sleep. , Disp: , Rfl:  .  losartan (COZAAR) 50 MG tablet, Take 50 mg by mouth daily., Disp: , Rfl:  .  Magnesium Oxide 400 MG CAPS, Take 1 capsule (400 mg total) by mouth daily., Disp: 30 capsule, Rfl: 3 .  metoprolol succinate (TOPROL XL) 25 MG 24 hr tablet, Take 1 tablet (25 mg total) by mouth daily., Disp: 30 tablet, Rfl: 11 .  Multiple Vitamin (MULTIVITAMIN WITH MINERALS) TABS tablet, Take 1 tablet by mouth daily., Disp: , Rfl:  .  pantoprazole (PROTONIX) 40 MG tablet, Take 40 mg by mouth daily.  , Disp: , Rfl:  .  potassium chloride SA (KLOR-CON) 20 MEQ tablet, Take 20 mEq by mouth 2 (two) times daily., Disp: , Rfl:    Ht Readings from Last 1 Encounters:  06/22/20 6' (1.829 m)     Wt Readings from Last 3 Encounters:  06/22/20 264 lb (119.7 kg)  06/16/20 265 lb 14 oz (120.6 kg)  06/10/20 265 lb 6.9 oz (120.4 kg)     There is no height or weight on file to calculate BMI.   Social History   Tobacco Use  Smoking Status Never Smoker  Smokeless Tobacco Never Used    Nutrition Note  Spoke with pt. Nutrition Plan and Nutrition Survey goals reviewed with pt.   Pt has Type 2 Diabetes. Last A1c indicates blood glucose well-controlled. He has freestyle libre CGM. He frequently checks CGM throughout day. Fasting CBG 109-120 mg/dl.  He has been through pulmonary rehab in the past.  Reviewed current intake.  He has eliminated fast foods, avoids sugary beverages, and tries to incorporate fruit daily.  Pt is not interested in making further diet changes at this time. He feels comfortable with his diet changes he has made already. Pt expressed understanding of the information reviewed.   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 Pt's individual nutrition plan reviewed with pt. ? Benefits of adopting healthy diet reviewed with Rate My Plate survey   ? Continue client-centered nutrition education by RD, as part of interdisciplinary care.  Goal(s) ? Pt to identify food quantities necessary to achieve weight loss of 6-24 lb at graduation from pulmonary rehab.  ? Pt to build a healthy plate including vegetables, fruits, whole grains, and low-fat dairy products in a heart healthy meal plan.  Plan:   Will provide client-centered nutrition education as part of interdisciplinary care  Monitor and evaluate progress toward nutrition goal with team.   Michaele Offer, MS, RDN, LDN

## 2020-06-30 NOTE — Progress Notes (Signed)
Daily Session Note  Patient Details  Name: Shane [name] MRN: [id number] Date of Birth: [birth year] Referring Provider:     Pulmonary Rehab Walk Test from 06/10/2020 in [facility]  Referring Provider [name]       Encounter Date: 06/30/2020  Check In:  Session Check In - 06/30/20 0942      Check-In   Supervising physician immediately available to respond to emergencies [facility] immediately available    Physician(s) Dr. Jamse Arn    Location [facility]    Staff Present Hoy Register, MS, CEP, Exercise Physiologist;Lisa Jani Gravel, MS, ACSM-CEP, Exercise Physiologist    Virtual Visit No    Medication changes reported     No    Fall or balance concerns reported    No    Tobacco Cessation No Change    Warm-up and Cool-down Performed on first and last piece of equipment    Resistance Training Performed Yes    VAD Patient? No    PAD/SET Patient? No      Pain Assessment   Currently in Pain? No/denies    Multiple Pain Sites No           Capillary Blood Glucose: No results found for this or any previous visit (from the past 24 hour(s)).  POCT Glucose - 06/30/20 1210      POCT Blood Glucose   Pre-Exercise 186 mg/dL    Post-Exercise 154 mg/dL           Exercise Prescription Changes - 06/30/20 1200      Response to Exercise   Blood Pressure (Admit) 142/78    Blood Pressure (Exercise) 146/82    Blood Pressure (Exit) 128/70    Heart Rate (Admit) 80 bpm    Heart Rate (Exercise) 91 bpm    Heart Rate (Exit) 80 bpm    Oxygen Saturation (Admit) 94 %    Oxygen Saturation (Exercise) 96 %    Oxygen Saturation (Exit) 97 %    Rating of Perceived Exertion (Exercise) 11    Perceived Dyspnea (Exercise) 1    Duration Continue with 30 min of aerobic exercise without signs/symptoms of physical distress.    Intensity THRR unchanged      Progression   Progression Continue to progress workloads  to maintain intensity without signs/symptoms of physical distress.    Average METs 1.8      Resistance Training   Training Prescription Yes    Weight Blue bands    Reps 10-15    Time 10 Minutes      NuStep   Level 2    SPM 80    Minutes 30    METs 1.8           Social History   Tobacco Use  Smoking Status Never Smoker  Smokeless Tobacco Never Used    Goals Met:  Proper associated with RPD/PD & O2 Sat Exercise tolerated well Strength training completed today  Goals Unmet:  Not Applicable  Comments: Service time is from 0943 to [place]    Dr. Fransico Him is Medical Director for Cardiac Rehab at [facility].

## 2020-07-01 LAB — CBC WITH DIFFERENTIAL/PLATELET
Basophils Absolute: 0.1 10*3/uL (ref 0.0–0.2)
Basos: 1 %
EOS (ABSOLUTE): 0.5 10*3/uL — ABNORMAL HIGH (ref 0.0–0.4)
Eos: 5 %
Hematocrit: 40.5 % (ref 37.5–51.0)
Hemoglobin: 14.3 g/dL (ref 13.0–17.7)
Immature Grans (Abs): 0 10*3/uL (ref 0.0–0.1)
Immature Granulocytes: 0 %
Lymphocytes Absolute: 2.5 10*3/uL (ref 0.7–3.1)
Lymphs: 28 %
MCH: 30.7 pg (ref 26.6–33.0)
MCHC: 35.3 g/dL (ref 31.5–35.7)
MCV: 87 fL (ref 79–97)
Monocytes Absolute: 1.1 10*3/uL — ABNORMAL HIGH (ref 0.1–0.9)
Monocytes: 12 %
Neutrophils Absolute: 4.9 10*3/uL (ref 1.4–7.0)
Neutrophils: 54 %
Platelets: 250 10*3/uL (ref 150–450)
RBC: 4.66 x10E6/uL (ref 4.14–5.80)
RDW: 13 % (ref 11.6–15.4)
WBC: 9.1 10*3/uL (ref 3.4–10.8)

## 2020-07-01 LAB — BASIC METABOLIC PANEL
BUN/Creatinine Ratio: 26 — ABNORMAL HIGH (ref 10–24)
BUN: 26 mg/dL (ref 8–27)
CO2: 23 mmol/L (ref 20–29)
Calcium: 9 mg/dL (ref 8.6–10.2)
Chloride: 99 mmol/L (ref 96–106)
Creatinine, Ser: 1.01 mg/dL (ref 0.76–1.27)
GFR calc Af Amer: 81 mL/min/{1.73_m2} (ref >59)
GFR calc non Af Amer: 70 mL/min/{1.73_m2} (ref >59)
Glucose: 143 mg/dL — ABNORMAL HIGH (ref 65–99)
Potassium: 4.8 mmol/L (ref 3.5–5.2)
Sodium: 137 mmol/L (ref 134–144)

## 2020-07-02 ENCOUNTER — Telehealth (HOSPITAL_COMMUNITY): Payer: Self-pay

## 2020-07-02 ENCOUNTER — Encounter (HOSPITAL_COMMUNITY): Payer: Medicare Other

## 2020-07-02 NOTE — Progress Notes (Signed)
Remote pacemaker transmission.   

## 2020-07-02 NOTE — Telephone Encounter (Signed)
I spoke with Jim's wife, [name]. She states that he has a heart cath procedure scheduled for 8/6 and was wondering if he should come to pulmonary rehab or not. She also states that he has been having some chest pain when her over exerts himself. I explained to [name] that Clair Gulling should not come to pulmonary rehab until he has the results from his heart cath. She voiced understanding and will speak to Clair Gulling about this.

## 2020-07-06 ENCOUNTER — Other Ambulatory Visit (HOSPITAL_COMMUNITY): Payer: Medicare Other

## 2020-07-06 ENCOUNTER — Telehealth: Payer: Self-pay | Admitting: Internal Medicine

## 2020-07-06 NOTE — Telephone Encounter (Signed)
Patient's wife calling stating the patient has his cath scheduled for Friday. They would like to know if it is okay for him to go to physical therapy tomorrow and Thursday.

## 2020-07-06 NOTE — Telephone Encounter (Signed)
Returned call to wife.  Advised to not start PT until after cardiac cath.

## 2020-07-07 ENCOUNTER — Telehealth: Payer: Self-pay | Admitting: Internal Medicine

## 2020-07-07 ENCOUNTER — Encounter (HOSPITAL_COMMUNITY): Payer: Medicare Other

## 2020-07-07 ENCOUNTER — Encounter: Payer: Self-pay | Admitting: Cardiovascular Disease

## 2020-07-07 NOTE — Telephone Encounter (Signed)
error 

## 2020-07-07 NOTE — Telephone Encounter (Signed)
Patients wife, [name], calling to update the patient's medication list.   She is stating that Dr. [name] (patients PCP) doesn't think the patient needs to be on nebivolol (BYSTOLIC) 10 MG tablet and metoprolol succinate (TOPROL XL) 25 MG 24 hr tablet at the same time so Dr. [name] is stopping nebivolol (BYSTOLIC) 10 MG tablet but advises that he thinks the metoprolol succinate (TOPROL XL) 25 MG 24 hr tablet dosage needs to be increased to 50mg  from 25mg .   She states that if Dr. Lovena Le is ok with upping the dosage on the metoprolol succinate (TOPROL XL) 25 MG 24 hr tablet, a 30 day supply can be called in to [id number] - [place], [place] - [address]

## 2020-07-08 ENCOUNTER — Telehealth: Payer: Self-pay | Admitting: *Deleted

## 2020-07-08 MED ORDER — METOPROLOL SUCCINATE ER 50 MG PO TB24: 50.0000 mg | ORAL_TABLET | Freq: Every day | ORAL | 3 refills | Status: DC

## 2020-07-08 NOTE — Telephone Encounter (Signed)
Wife of the patient called back to follow-up on her request from yesterday. She asks that the office call her instead of the patient as the patient does not hear well on the phone

## 2020-07-08 NOTE — Telephone Encounter (Signed)
Pt contacted pre-catheterization scheduled at [facility] for: Friday July 10, 2020 8 AM Verified arrival time and place: [facility]) at: 6 AM   No solid food after midnight prior to cath, clear liquids until 5 AM day of procedure.  Hold: No diabetes medicines AM of procedure including: Insulin-AM of procedure-1/2 usual Insulin dose HS prior to procedure.  Lasix/KCl-AM of procedure.  Except hold medications AM meds can be  taken pre-cath with sips of water including: ASA 81 mg   Confirmed patient has responsible adult to drive home post procedure and observe 24 hours after arriving home: yes  You are allowed ONE visitor in the waiting room during your procedure. Both you and your visitor must wear a mask once you enter the hospital.      COVID-19 Pre-Screening Questions:  . In the past 14 days have you had a new cough associated with shortness of breath, fever (100.4 or greater) or sudden loss of taste or sense of smell? no . In the past 14 days have you been around anyone with known Covid 19? no . Have you been vaccinated for COVID-19? Yes, see immunization history   Reviewed procedure/mask/visitor instructions, COVID-19 questions with patient.

## 2020-07-08 NOTE — Telephone Encounter (Signed)
Per review of last OV note with [facility]  Pt reported at that time that he was NOT taking nebivolol or toprol xl 25 mg  He reported taking Toprol XL 50 mg daily  This nurse will update med list and send in refill per Pt request.

## 2020-07-09 ENCOUNTER — Encounter (HOSPITAL_COMMUNITY): Payer: Medicare Other

## 2020-07-09 ENCOUNTER — Telehealth: Payer: Self-pay | Admitting: Cardiovascular Disease

## 2020-07-09 ENCOUNTER — Other Ambulatory Visit (HOSPITAL_COMMUNITY)
Admission: RE | Admit: 2020-07-09 | Discharge: 2020-07-09 | Disposition: A | Discharge status: Home / Self Care | Payer: Medicare Other | Source: Ambulatory Visit | Attending: Cardiovascular Disease | Admitting: Cardiovascular Disease

## 2020-07-09 DIAGNOSIS — Z20822 Contact with and (suspected) exposure to covid-19: Secondary | ICD-10-CM | POA: Diagnosis not present

## 2020-07-09 DIAGNOSIS — Z01812 Encounter for preprocedural laboratory examination: Secondary | ICD-10-CM | POA: Insufficient documentation

## 2020-07-09 LAB — SARS CORONAVIRUS 2 (TAT 6-24 HRS): SARS Coronavirus 2: NEGATIVE

## 2020-07-09 NOTE — Telephone Encounter (Signed)
    Pt's wife calling, she said she was told by pt doctor to let Dr. Burt Knack and Dr. Lovena Le know that he is part of idiopathic pulmonary fibrosis clinical trial. Pt is taking pamrevlumab, monoclonal antibody. zephyrus [id number].  She said this is an active drug, and he is getting [facility] at [facility] and he is scheduled tomorrow. She said they can look the trial at [facility] she requested if Dr. Antionette Char nurse can call her back.

## 2020-07-09 NOTE — Telephone Encounter (Signed)
The patient's wife wants Dr. Burt Knack and Dr. Lovena Le to know he is in a clinical trial for his idiopathic pulmonary fibrosis and receiving monoclonal antibody infusions. He has been in the trial for months.   Also reviewed cath instructions for tomorrow with her.  Sent instructions via MyChart as she seemed to be confused about timing.

## 2020-07-10 ENCOUNTER — Encounter (HOSPITAL_COMMUNITY): Payer: Self-pay | Admitting: Cardiovascular Disease

## 2020-07-10 ENCOUNTER — Encounter (HOSPITAL_COMMUNITY)
Admission: RE | Disposition: A | Discharge status: Home / Self Care | Payer: Medicare Other | Source: Home / Self Care | Attending: Cardiovascular Disease

## 2020-07-10 ENCOUNTER — Ambulatory Visit (HOSPITAL_COMMUNITY)
Admission: RE | Admit: 2020-07-10 | Discharge: 2020-07-10 | Disposition: A | Discharge status: Home / Self Care | Payer: Medicare Other | Attending: Cardiovascular Disease | Admitting: Cardiovascular Disease

## 2020-07-10 DIAGNOSIS — Z825 Family history of asthma and other chronic lower respiratory diseases: Secondary | ICD-10-CM | POA: Diagnosis not present

## 2020-07-10 DIAGNOSIS — Z888 Allergy status to other drugs, medicaments and biological substances status: Secondary | ICD-10-CM | POA: Diagnosis not present

## 2020-07-10 DIAGNOSIS — I129 Hypertensive chronic kidney disease with stage 1 through stage 4 chronic kidney disease, or unspecified chronic kidney disease: Secondary | ICD-10-CM | POA: Insufficient documentation

## 2020-07-10 DIAGNOSIS — Z794 Long term (current) use of insulin: Secondary | ICD-10-CM | POA: Diagnosis not present

## 2020-07-10 DIAGNOSIS — E1136 Type 2 diabetes mellitus with diabetic cataract: Secondary | ICD-10-CM | POA: Diagnosis not present

## 2020-07-10 DIAGNOSIS — K219 Gastro-esophageal reflux disease without esophagitis: Secondary | ICD-10-CM | POA: Diagnosis not present

## 2020-07-10 DIAGNOSIS — I25118 Atherosclerotic heart disease of native coronary artery with other forms of angina pectoris: Secondary | ICD-10-CM | POA: Diagnosis present

## 2020-07-10 DIAGNOSIS — Z885 Allergy status to narcotic agent status: Secondary | ICD-10-CM | POA: Diagnosis not present

## 2020-07-10 DIAGNOSIS — G709 Myoneural disorder, unspecified: Secondary | ICD-10-CM | POA: Diagnosis not present

## 2020-07-10 DIAGNOSIS — N183 Chronic kidney disease, stage 3 unspecified: Secondary | ICD-10-CM | POA: Insufficient documentation

## 2020-07-10 DIAGNOSIS — J8410 Pulmonary fibrosis, unspecified: Secondary | ICD-10-CM | POA: Insufficient documentation

## 2020-07-10 DIAGNOSIS — Z7982 Long term (current) use of aspirin: Secondary | ICD-10-CM | POA: Diagnosis not present

## 2020-07-10 DIAGNOSIS — I2584 Coronary atherosclerosis due to calcified coronary lesion: Secondary | ICD-10-CM | POA: Diagnosis not present

## 2020-07-10 DIAGNOSIS — Z79899 Other long term (current) drug therapy: Secondary | ICD-10-CM | POA: Insufficient documentation

## 2020-07-10 DIAGNOSIS — E1122 Type 2 diabetes mellitus with diabetic chronic kidney disease: Secondary | ICD-10-CM | POA: Diagnosis not present

## 2020-07-10 DIAGNOSIS — E785 Hyperlipidemia, unspecified: Secondary | ICD-10-CM | POA: Diagnosis not present

## 2020-07-10 DIAGNOSIS — G4730 Sleep apnea, unspecified: Secondary | ICD-10-CM | POA: Insufficient documentation

## 2020-07-10 DIAGNOSIS — I350 Nonrheumatic aortic (valve) stenosis: Secondary | ICD-10-CM | POA: Insufficient documentation

## 2020-07-10 DIAGNOSIS — I2510 Atherosclerotic heart disease of native coronary artery without angina pectoris: Secondary | ICD-10-CM | POA: Diagnosis present

## 2020-07-10 DIAGNOSIS — Z833 Family history of diabetes mellitus: Secondary | ICD-10-CM | POA: Insufficient documentation

## 2020-07-10 DIAGNOSIS — I441 Atrioventricular block, second degree: Secondary | ICD-10-CM | POA: Diagnosis not present

## 2020-07-10 HISTORY — PX: LEFT HEART CATH AND CORONARY ANGIOGRAPHY: CATH118249

## 2020-07-10 LAB — GLUCOSE, CAPILLARY: Glucose-Capillary: 137 mg/dL — ABNORMAL HIGH (ref 70–99)

## 2020-07-10 SURGERY — LEFT HEART CATH AND CORONARY ANGIOGRAPHY
Anesthesia: LOCAL

## 2020-07-10 MED ORDER — HEPARIN SODIUM (PORCINE) 1000 UNIT/ML IJ SOLN
INTRAMUSCULAR | Status: DC | PRN
  Administered 2020-07-10: 6000 [IU] via INTRAVENOUS

## 2020-07-10 MED ORDER — SODIUM CHLORIDE 0.9 % IV SOLN: 250.0000 mL | INTRAVENOUS | Status: DC | PRN

## 2020-07-10 MED ORDER — ONDANSETRON HCL 4 MG/2ML IJ SOLN: 4.0000 mg | Freq: Four times a day (QID) | INTRAMUSCULAR | Status: DC | PRN

## 2020-07-10 MED ORDER — MIDAZOLAM HCL 2 MG/2ML IJ SOLN
INTRAMUSCULAR | Status: DC | PRN
  Administered 2020-07-10: 1 mg via INTRAVENOUS

## 2020-07-10 MED ORDER — LABETALOL HCL 5 MG/ML IV SOLN: 10.0000 mg | INTRAVENOUS | Status: DC | PRN

## 2020-07-10 MED ORDER — HEPARIN (PORCINE) IN NACL 1000-0.9 UT/500ML-% IV SOLN
INTRAVENOUS | Status: AC
  Filled 2020-07-10: qty 500

## 2020-07-10 MED ORDER — SODIUM CHLORIDE 0.9% FLUSH: 3.0000 mL | INTRAVENOUS | Status: DC | PRN

## 2020-07-10 MED ORDER — FENTANYL CITRATE (PF) 100 MCG/2ML IJ SOLN
INTRAMUSCULAR | Status: DC | PRN
  Administered 2020-07-10: 25 ug via INTRAVENOUS

## 2020-07-10 MED ORDER — HEPARIN (PORCINE) IN NACL 1000-0.9 UT/500ML-% IV SOLN
INTRAVENOUS | Status: DC | PRN
  Administered 2020-07-10 (×2): 500 mL

## 2020-07-10 MED ORDER — SODIUM CHLORIDE 0.9 % WEIGHT BASED INFUSION: 1.0000 mL/kg/h | INTRAVENOUS | Status: DC

## 2020-07-10 MED ORDER — ACETAMINOPHEN 325 MG PO TABS: 650.0000 mg | ORAL_TABLET | ORAL | Status: DC | PRN

## 2020-07-10 MED ORDER — SODIUM CHLORIDE 0.9 % IV SOLN: INTRAVENOUS | Status: DC

## 2020-07-10 MED ORDER — SODIUM CHLORIDE 0.9 % WEIGHT BASED INFUSION
3.0000 mL/kg/h | INTRAVENOUS | Status: AC
  Administered 2020-07-10: 3 mL/kg/h via INTRAVENOUS

## 2020-07-10 MED ORDER — LIDOCAINE HCL (PF) 1 % IJ SOLN
INTRAMUSCULAR | Status: DC | PRN
  Administered 2020-07-10: 2 mL

## 2020-07-10 MED ORDER — SODIUM CHLORIDE 0.9% FLUSH: 3.0000 mL | Freq: Two times a day (BID) | INTRAVENOUS | Status: DC

## 2020-07-10 MED ORDER — VERAPAMIL HCL 2.5 MG/ML IV SOLN
INTRAVENOUS | Status: DC | PRN
  Administered 2020-07-10: 10 mL via INTRA_ARTERIAL

## 2020-07-10 MED ORDER — VERAPAMIL HCL 2.5 MG/ML IV SOLN
INTRAVENOUS | Status: AC
  Filled 2020-07-10: qty 2

## 2020-07-10 MED ORDER — FENTANYL CITRATE (PF) 100 MCG/2ML IJ SOLN
INTRAMUSCULAR | Status: AC
  Filled 2020-07-10: qty 2

## 2020-07-10 MED ORDER — IOHEXOL 350 MG/ML SOLN
INTRAVENOUS | Status: DC | PRN
  Administered 2020-07-10: 90 mL

## 2020-07-10 MED ORDER — HEPARIN SODIUM (PORCINE) 1000 UNIT/ML IJ SOLN
INTRAMUSCULAR | Status: AC
  Filled 2020-07-10: qty 1

## 2020-07-10 MED ORDER — LIDOCAINE HCL (PF) 1 % IJ SOLN
INTRAMUSCULAR | Status: AC
  Filled 2020-07-10: qty 30

## 2020-07-10 MED ORDER — ASPIRIN 81 MG PO CHEW: 81.0000 mg | CHEWABLE_TABLET | ORAL | Status: DC

## 2020-07-10 MED ORDER — HYDRALAZINE HCL 20 MG/ML IJ SOLN: 10.0000 mg | INTRAMUSCULAR | Status: DC | PRN

## 2020-07-10 MED ORDER — MIDAZOLAM HCL 2 MG/2ML IJ SOLN
INTRAMUSCULAR | Status: AC
  Filled 2020-07-10: qty 2

## 2020-07-10 SURGICAL SUPPLY — 12 items
CATH 5FR JL3.5 JR4 ANG PIG MP (CATHETERS) ×1 IMPLANT
CATH INFINITI 5FR AL1 (CATHETERS) ×1 IMPLANT
DEVICE RAD COMP TR BAND LRG (VASCULAR PRODUCTS) ×2 IMPLANT
GLIDESHEATH SLEND SS 6F .021 (SHEATH) ×2 IMPLANT
GUIDEWIRE ANGLED .035X150CM (WIRE) ×1 IMPLANT
GUIDEWIRE INQWIRE 1.5J.035X260 (WIRE) IMPLANT
INQWIRE 1.5J .035X260CM (WIRE) ×2
KIT HEART LEFT (KITS) ×2 IMPLANT
PACK CARDIAC CATHETERIZATION (CUSTOM PROCEDURE TRAY) ×2 IMPLANT
TRANSDUCER W/STOPCOCK (MISCELLANEOUS) ×2 IMPLANT
TUBING CIL FLEX 10 FLL-RA (TUBING) ×2 IMPLANT
WIRE EMERALD ST .035X150CM (WIRE) ×1 IMPLANT

## 2020-07-10 NOTE — Interval H&P Note (Signed)
Cath Lab Visit (complete for each Cath Lab visit)  Clinical Evaluation Leading to the Procedure:   ACS: No.  Non-ACS:    Anginal Classification: CCS III  Anti-ischemic medical therapy: Minimal Therapy (1 class of medications)  Non-Invasive Test Results: No non-invasive testing performed  Prior CABG: No previous CABG      History and Physical Interval Note:  07/10/2020 7:55 AM  Shane [name]  has presented today for surgery, with the diagnosis of angina.  The various methods of treatment have been discussed with the patient and family. After consideration of risks, benefits and other options for treatment, the patient has consented to  Procedure(s): LEFT HEART CATH AND CORONARY ANGIOGRAPHY (N/A) as a surgical intervention.  The patient's history has been reviewed, patient examined, no change in status, stable for surgery.  I have reviewed the patient's chart and labs.  Questions were answered to the patient's satisfaction.     Sherren Mocha

## 2020-07-10 NOTE — Discharge Instructions (Signed)
Drink plenty of fluids for 48 hours and keep wrist elevated at heart level for 24 hours  Radial Site Care   This sheet gives you information about how to care for yourself after your procedure. Your health care provider may also give you more specific instructions. If you have problems or questions, contact your health care provider. What can I expect after the procedure? After the procedure, it is common to have:  Bruising and tenderness at the catheter insertion area. Follow these instructions at home: Medicines  Take over-the-counter and prescription medicines only as told by your health care provider. Insertion site care 1. Follow instructions from your health care provider about how to take care of your insertion site. Make sure you: ? Wash your hands with soap and water before you change your bandage (dressing). If soap and water are not available, use hand sanitizer. ? Remove your dressing as told by your health care provider. In 24 hours 2. Check your insertion site every day for signs of infection. Check for: ? Redness, swelling, or pain. ? Fluid or blood. ? Pus or a bad smell. ? Warmth. 3. Do not take baths, swim, or use a hot tub until your health care provider approves. 4. You may shower 24-48 hours after the procedure, or as directed by your health care provider. ? Remove the dressing and gently wash the site with plain soap and water. ? Pat the area dry with a clean towel. ? Do not rub the site. That could cause bleeding. 5. Do not apply powder or lotion to the site. Activity   1. For 24 hours after the procedure, or as directed by your health care provider: ? Do not flex or bend the affected arm. ? Do not push or pull heavy objects with the affected arm. ? Do not drive yourself home from the hospital or clinic. You may drive 24 hours after the procedure unless your health care provider tells you not to. ? Do not operate machinery or power tools. 2. Do not lift  anything that is heavier than 10 lb (4.5 kg), or the limit that you are told, until your health care provider says that it is safe.  For 4 days 3. Ask your health care provider when it is okay to: ? Return to work or school. ? Resume usual physical activities or sports. ? Resume sexual activity. General instructions  If the catheter site starts to bleed, raise your arm and put firm pressure on the site. If the bleeding does not stop, get help right away. This is a medical emergency.  If you went home on the same day as your procedure, a responsible adult should be with you for the first 24 hours after you arrive home.  Keep all follow-up visits as told by your health care provider. This is important. Contact a health care provider if:  You have a fever.  You have redness, swelling, or yellow drainage around your insertion site. Get help right away if:  You have unusual pain at the radial site.  The catheter insertion area swells very fast.  The insertion area is bleeding, and the bleeding does not stop when you hold steady pressure on the area.  Your arm or hand becomes pale, cool, tingly, or numb. These symptoms may represent a serious problem that is an emergency. Do not wait to see if the symptoms will go away. Get medical help right away. Call your local emergency services (911 in the [place] Do   not drive yourself to the hospital. Summary  After the procedure, it is common to have bruising and tenderness at the site.  Follow instructions from your health care provider about how to take care of your radial site wound. Check the wound every day for signs of infection.  Do not lift anything that is heavier than 10 lb (4.5 kg), or the limit that you are told, until your health care provider says that it is safe. This information is not intended to replace advice given to you by your health care provider. Make sure you discuss any questions you have with your health care  provider. Document Revised: 12/27/2017 Document Reviewed: 12/27/2017 Elsevier Patient Education  2020 [employer]  

## 2020-07-13 ENCOUNTER — Telehealth (HOSPITAL_COMMUNITY): Payer: Self-pay

## 2020-07-13 ENCOUNTER — Ambulatory Visit (HOSPITAL_COMMUNITY)
Admission: RE | Admit: 2020-07-13 | Discharge: 2020-07-13 | Disposition: A | Discharge status: Home / Self Care | Payer: Medicare Other | Source: Ambulatory Visit | Attending: Internal Medicine | Admitting: Internal Medicine

## 2020-07-13 ENCOUNTER — Encounter: Payer: Medicare Other | Admitting: *Deleted

## 2020-07-13 ENCOUNTER — Other Ambulatory Visit: Payer: Self-pay

## 2020-07-13 DIAGNOSIS — Z006 Encounter for examination for normal comparison and control in clinical research program: Secondary | ICD-10-CM | POA: Insufficient documentation

## 2020-07-13 DIAGNOSIS — J84112 Idiopathic pulmonary fibrosis: Secondary | ICD-10-CM | POA: Insufficient documentation

## 2020-07-13 MED ORDER — STUDY - FIBROGEN - PAMREVLUMAB 10 MG/ML IV INFUSION (OPEN LABEL) (PI-RAMASWAMY)
30.0000 mg/kg | Freq: Once | INTRAVENOUS | Status: AC
  Administered 2020-07-13: 3670 mg via INTRAVENOUS
  Filled 2020-07-13: qty 367

## 2020-07-13 NOTE — Research (Signed)
Title: [id number] Open-Label Extension (OLE) is a multi-center, single-arm, open-label extension (OLE) phase where subjects who complete the Week 48 visit of the main study are eligible to participate. The extension to the main study allows for the continued evaluation of the efficacy and safety of 30 mg/kg IV infusions of pamrevlumab administered every 3 weeks for 52 weeks in subjects with Idiopathic Pulmonary Fibrosis.  Primary endpoint is: To provide continued access to pamrevlumab in subjects with IPF who completed the Week 48 visit of the main study  [place] / Research RN note : This visit for Subject Shane [name] with DOB: [id number] on 07/13/2020 for the above protocol is Visit#EX [id number] and is for purpose of research. The consent for this encounter is under Protocol Version Amendment 5 and is currently IRB approved. Subject expressed continued interest and consent in continuing as a study subject. Subject confirmed that there was no change in contact information (e.g. address, telephone, email). Subject thanked for participation in research and contribution to science.   In this visit 07/13/2020 the subject is not required to be evaluated by the investigator.  The subject was re-consented prior to any study related procedures being performed. Refer to the [facility] Consent checklist for details of the consent process. The sub-I, Tammy Parrett , NP, was involved in the consent process via WebEx.   All study visit EX Week 36 procedures and assessments were performed per the above mentioned protocol. 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 This coordinator will evaluate the cath report for AE's. The AE of Progressive angina has been recorded as the reason for the cath. Subject states that bypass surgery has been recommended but he will meet with cardiologist  before he decides about this. Study drug was infused without problems. Subject will return in 3 weeks for study visit EX week 39.  Signed by Hale Drone, MS, [place] Coordinator [place], [place] 12:53 PM 07/13/2020

## 2020-07-14 ENCOUNTER — Encounter (HOSPITAL_COMMUNITY): Payer: Medicare Other

## 2020-07-14 ENCOUNTER — Inpatient Hospital Stay (HOSPITAL_COMMUNITY)
Admission: RE | Admit: 2020-07-14 | Discharge: 2020-07-14 | Disposition: A | Discharge status: Home / Self Care | Payer: Medicare Other | Source: Ambulatory Visit | Attending: Internal Medicine | Admitting: Internal Medicine

## 2020-07-15 NOTE — Progress Notes (Signed)
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  albuterol (PROAIR HFA) 108 (90 Base) MCG/ACT inhaler, Inhale 2 puffs into the lungs every 4 (four) hours as needed for wheezing or shortness of breath., Disp: , Rfl:  .  Ascorbic Acid (VITAMIN C) 1000 MG tablet, Take 1,000 mg by mouth daily., Disp: , Rfl:  .  aspirin EC 81 MG tablet, Take 81 mg by mouth daily., Disp: , Rfl:  .  clotrimazole-betamethasone (LOTRISONE) cream, Apply 1 application topically daily as needed (for skin infection). , Disp: , Rfl: 0 .  Continuous Blood Gluc Sensor (FREESTYLE LIBRE 14 DAY SENSOR) MISC, 1 patch every 14 (fourteen) days., Disp: , Rfl:  .  EPINEPHrine 0.3 mg/0.3 mL IJ SOAJ injection, Inject 0.3 mg into the muscle once as needed for anaphylaxis. , Disp: , Rfl: 0 .  furosemide (LASIX) 40 MG tablet, Take 20 mg by mouth daily. , Disp: , Rfl:  .  insulin detemir (LEVEMIR) 100 UNIT/ML injection, Inject 12 Units into the skin at bedtime. , Disp: , Rfl:  .  insulin lispro (HUMALOG) 100 UNIT/ML injection, Inject 76 Units into the skin daily. Use as directed with V-Go 40 insulin pump, Disp: , Rfl:  .  ketotifen (ZADITOR) 0.025 % ophthalmic solution, Place 1 drop into both eyes 2 (two) times daily as needed (allergies). , Disp: , Rfl:  .  LORazepam (ATIVAN) 1 MG tablet, Take 1 mg by mouth at bedtime as needed for sleep. , Disp: , Rfl:  .  losartan (COZAAR) 50 MG tablet, Take 50 mg by mouth daily., Disp: , Rfl:  .  Magnesium  Oxide 400 MG CAPS, Take 1 capsule (400 mg total) by mouth daily., Disp: 30 capsule, Rfl: 3 .  metoprolol succinate (TOPROL-XL) 50 MG 24 hr tablet, Take 1 tablet (50 mg total) by mouth daily. Take with or immediately following a meal., Disp: 90 tablet, Rfl: 3 .  Multiple Vitamin (MULTIVITAMIN WITH MINERALS) TABS tablet, Take 1 tablet by mouth daily., Disp: , Rfl:  .  pantoprazole (PROTONIX) 40 MG tablet, Take 40 mg by mouth daily.  , Disp: , Rfl:  .  potassium chloride SA (KLOR-CON) 20 MEQ tablet, Take 20 mEq by mouth daily. , Disp: , Rfl:   Past Medical History: Past Medical History:  Diagnosis Date  . 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 Cataract   . Chronic kidney disease    d/t DM  . CKD (chronic kidney disease), stage III   . Diabetes mellitus    Vgo disposal insulin bolus  simular to insulin pump  . Dyspnea   . GERD (gastroesophageal reflux disease)   . Hyperlipidemia   . Hypertension   . 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 ILD (interstitial lung disease) ([place])   . Moderate aortic stenosis    a. 10/2019 Echo: EF 55-60%, Gr2 DD. Nl RV.   Marland Kitchen Neuromuscular disorder ([place])   . Nonobstructive CAD (coronary artery disease)    a. 2012 Cath: mod,  nonobs dzs; b. 10/2016 MV: EF 60%, no ischemia.  Marland Kitchen PONV (postoperative nausea and vomiting)   . Sleep apnea     uses cpap asked to bring mask and tubing    Tobacco Use: Social History   Tobacco Use  Smoking Status Never Smoker  Smokeless Tobacco Never Used    Labs: Recent Review Flowsheet Data    Labs for ITP Cardiac and Pulmonary Rehab Latest Ref Rng & Units 12/26/2016 12/27/2016 02/18/2018 04/27/2018 03/28/2020   Cholestrol 0 - 200 mg/dL - - - - -   LDLCALC 0 - 99 mg/dL - - - - -   HDL >39 mg/dL - - - - -   Trlycerides <150 mg/dL - - - - -   Hemoglobin A1c 4.8 - 5.6 % - - - - 7.6(H)   PHART 7.35 - 7.45 7.376 7.362 - - -   PCO2ART 32 - 48 mmHg 44.0 45.4 - - -   HCO3 20.0 - 28.0 mmol/L 25.2 25.1 - - -   TCO2 22 - 32 mmol/L - - 25 21(L)  -   ACIDBASEDEF 0.0 - 2.0 mmol/L - - - - -   O2SAT % 93.9 95.7 - - -      Capillary Blood Glucose: Lab Results  Component Value Date   GLUCAP 137 (H) 07/10/2020   GLUCAP 105 (H) 03/31/2020   GLUCAP 90 03/30/2020   GLUCAP 76 03/30/2020   GLUCAP 108 (H) 03/30/2020    POCT Glucose    Row Name 06/16/20 1123 06/30/20 1210           POCT Blood Glucose   Pre-Exercise 172 mg/dL 186 mg/dL      Post-Exercise 156 mg/dL 154 mg/dL             Pulmonary Assessment Scores:  Pulmonary Assessment Scores    Row Name 06/10/20 0947 06/10/20 1034       ADL UCSD   ADL Phase Entry --    SOB Score total 79 --      CAT Score   CAT Score 17 --      mMRC Score   mMRC Score -- 4          UCSD: Self-administered rating of dyspnea associated with activities of daily living (ADLs) 6-point scale (0 = "not at all" to 5 = "maximal or unable to do because of breathlessness")  Scoring Scores range from 0 to 120.  Minimally important difference is 5 units  CAT: CAT can identify the health impairment of COPD patients and is better correlated with disease progression.  CAT has a scoring range of zero to 40. The CAT score is classified into four groups of low (less than 10), medium (10 - 20), high (21-30) and very high (31-40) based on the impact level of disease on health status. A CAT score over 10 suggests significant symptoms.  A worsening CAT score could be explained by an exacerbation, poor medication adherence, poor inhaler technique, or progression of COPD or comorbid conditions.  CAT MCID is 2 points  mMRC: mMRC (Modified Medical Research Council) Dyspnea Scale is used to assess the degree of baseline functional disability in patients of respiratory disease due to dyspnea. No minimal important difference is established. A decrease in score of 1 point or greater is considered a positive change.   Pulmonary Function Assessment:  Pulmonary Function Assessment - 06/10/20 0946      Breath    Bilateral Breath Sounds Clear    Shortness of  Breath Yes;Limiting activity           Exercise Target Goals: Exercise Program Goal: Individual exercise prescription set using results from initial 6 min walk test and THRR while considering  patient's activity barriers and safety.   Exercise Prescription Goal: Initial exercise prescription builds to 30-45 minutes a day of aerobic activity, 2-3 days per week.  Home exercise guidelines will be given to patient during program as part of exercise prescription that the participant will acknowledge.  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 Pt had lightheadedness at 2:20. Pt sat for 1:10 then resumed. 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 SOB, RPD = 3     Resting HR 59 bpm     Resting BP 110/64     Resting Oxygen Saturation  97 %     Exercise Oxygen Saturation  during 6 min walk 91 %     Max Ex. HR 102 bpm     Max Ex. BP 140/76     2 Minute Post BP 124/72       Interval HR   1 Minute HR 75     2 Minute HR 95     3 Minute HR 97     4 Minute HR 91     5 Minute HR 99     6 Minute HR 102     2 Minute Post HR 73     Interval Heart Rate? Yes       Interval Oxygen   Interval Oxygen? Yes     Baseline Oxygen Saturation % 97 %     1 Minute  Oxygen Saturation % 94 %     1 Minute Liters of Oxygen 0 L     2 Minute Oxygen Saturation % 91 %     2 Minute Liters of Oxygen 0 L     3 Minute Oxygen Saturation % 95 %     3 Minute Liters of Oxygen 0 L     4 Minute Oxygen Saturation % 95 %     4 Minute Liters of Oxygen 0 L     5 Minute Oxygen Saturation % 92 %     5 Minute Liters of Oxygen 0 L     6 Minute Oxygen Saturation % 91 %     6 Minute Liters of Oxygen 0 L     2 Minute Post Oxygen Saturation % 96 %     2 Minute Post Liters of Oxygen 0 L  Oxygen Initial Assessment:  Oxygen Initial Assessment - 06/10/20 1033      Initial 6 min Walk   Oxygen Used None      Program Oxygen Prescription   Program Oxygen Prescription None      Intervention   Short Term Goals To learn and understand importance of monitoring SPO2 with pulse oximeter and demonstrate accurate use of the pulse oximeter.;To learn and understand importance of maintaining oxygen saturations>88%;To learn and demonstrate proper pursed lip breathing techniques or other breathing techniques.;To learn and demonstrate proper use of respiratory medications    Long  Term Goals Verbalizes importance of monitoring SPO2 with pulse oximeter and return demonstration;Maintenance of O2 saturations>88%;Exhibits proper breathing techniques, such as pursed lip breathing or other method taught during program session;Compliance with respiratory medication           Oxygen Re-Evaluation:  Oxygen Re-Evaluation    Row Name 06/16/20 1200 07/14/20 0755           Program Oxygen Prescription   Program Oxygen Prescription None None        Home Oxygen   Home Oxygen Device None None      Sleep Oxygen Prescription CPAP CPAP      Home Exercise Oxygen Prescription None None      Home at Rest Exercise Oxygen Prescription None None      Compliance with Home Oxygen Use Yes Yes        Goals/Expected Outcomes   Short Term Goals To learn and understand importance of monitoring  SPO2 with pulse oximeter and demonstrate accurate use of the pulse oximeter.;To learn and understand importance of maintaining oxygen saturations>88%;To learn and demonstrate proper pursed lip breathing techniques or other breathing techniques.;To learn and demonstrate proper use of respiratory medications To learn and understand importance of monitoring SPO2 with pulse oximeter and demonstrate accurate use of the pulse oximeter.;To learn and understand importance of maintaining oxygen saturations>88%;To learn and demonstrate proper pursed lip breathing techniques or other breathing techniques.;To learn and demonstrate proper use of respiratory medications      Long  Term Goals Verbalizes importance of monitoring SPO2 with pulse oximeter and return demonstration;Maintenance of O2 saturations>88%;Exhibits proper breathing techniques, such as pursed lip breathing or other method taught during program session;Compliance with respiratory medication Verbalizes importance of monitoring SPO2 with pulse oximeter and return demonstration;Maintenance of O2 saturations>88%;Exhibits proper breathing techniques, such as pursed lip breathing or other method taught during program session;Compliance with respiratory medication      Goals/Expected Outcomes compliance compliance             Oxygen Discharge (Final Oxygen Re-Evaluation):  Oxygen Re-Evaluation - 07/14/20 0755      Program Oxygen Prescription   Program Oxygen Prescription None      Home Oxygen   Home Oxygen Device None    Sleep Oxygen Prescription CPAP    Home Exercise Oxygen Prescription None    Home at Rest Exercise Oxygen Prescription None    Compliance with Home Oxygen Use Yes      Goals/Expected Outcomes   Short Term Goals To learn and understand importance of monitoring SPO2 with pulse oximeter and demonstrate accurate use of the pulse oximeter.;To learn and understand importance of maintaining oxygen saturations>88%;To learn and  demonstrate proper pursed lip breathing techniques or other breathing techniques.;To learn and demonstrate proper use of respiratory medications    Long  Term Goals Verbalizes importance of monitoring SPO2 with pulse oximeter and return demonstration;Maintenance of O2 saturations>88%;Exhibits proper breathing techniques,  such as pursed lip breathing or other method taught during program session;Compliance with respiratory medication    Goals/Expected Outcomes compliance           Initial Exercise Prescription:  Initial Exercise Prescription - 06/10/20 1000      Date of Initial Exercise RX and Referring Provider   Date 06/10/20    Referring Provider Brand Males (P)     Expected Discharge Date 08/06/20 (P)       NuStep   Level 1    SPM 60    Minutes 25    METs 1.5      Prescription Details   Frequency (times per week) 2    Duration Progress to 30 minutes of continuous aerobic without signs/symptoms of physical distress      Intensity   THRR 40-80% of Max Heartrate 56-113    Ratings of Perceived Exertion 11-13    Perceived Dyspnea 0-4      Progression   Progression Continue progressive overload as per policy without signs/symptoms or physical distress.      Resistance Training   Training Prescription Yes    Weight Resistance Band    Reps 10-15           Perform Capillary Blood Glucose checks as needed.  Exercise Prescription Changes:  Exercise Prescription Changes    Row Name 06/16/20 1100 06/23/20 1100 06/30/20 1200         Response to Exercise   Blood Pressure (Admit) 130/80 -- 142/78     Blood Pressure (Exercise) 142/62 -- 146/82     Blood Pressure (Exit) 128/64 -- 128/70     Heart Rate (Admit) 87 bpm -- 80 bpm     Heart Rate (Exercise) 70 bpm -- 91 bpm     Heart Rate (Exit) 95 bpm -- 80 bpm     Oxygen Saturation (Admit) 98 % -- 94 %     Oxygen Saturation (Exercise) 97 % -- 96 %     Oxygen Saturation (Exit) 97 % -- 97 %     Rating of Perceived  Exertion (Exercise) 11 -- 11     Perceived Dyspnea (Exercise) 1 -- 1     Duration Continue with 30 min of aerobic exercise without signs/symptoms of physical distress. -- Continue with 30 min of aerobic exercise without signs/symptoms of physical distress.     Intensity --  40-80 % HRR -- THRR unchanged       Progression   Progression Continue to progress workloads to maintain intensity without signs/symptoms of physical distress. -- Continue to progress workloads to maintain intensity without signs/symptoms of physical distress.     Average METs -- -- 1.8       Resistance Training   Training Prescription Yes -- Yes     Weight Blue Bands -- Blue bands     Reps 10-15 -- 10-15     Time 10 Minutes -- 10 Minutes       NuStep   Level 2 -- 2     SPM 80 -- 80     Minutes 30 -- 30     METs 1.8 -- 1.8       Home Exercise Plan   Plans to continue exercise at -- Home (comment) --     Frequency -- Add 1 additional day to program exercise sessions. --     Initial Home Exercises Provided -- 06/23/20 --            Exercise Comments:  Exercise  Comments    Row Name 06/16/20 1204 06/23/20 1145         Exercise Comments Pt completed his first exercise session in pulmonary rehab today. He tolerated exercise well and had no complaints. home exercise reviewed             Exercise Goals and Review:  Exercise Goals    Row Name 06/10/20 1046 06/16/20 1201 07/14/20 0755         Exercise Goals   Increase Physical Activity Yes Yes Yes     Intervention Provide advice, education, support and counseling about physical activity/exercise needs.;Develop an individualized exercise prescription for aerobic and resistive training based on initial evaluation findings, risk stratification, comorbidities and participant's personal goals. Provide advice, education, support and counseling about physical activity/exercise needs.;Develop an individualized exercise prescription for aerobic and resistive  training based on initial evaluation findings, risk stratification, comorbidities and participant's personal goals. Provide advice, education, support and counseling about physical activity/exercise needs.;Develop an individualized exercise prescription for aerobic and resistive training based on initial evaluation findings, risk stratification, comorbidities and participant's personal goals.     Expected Outcomes Short Term: Attend rehab on a regular basis to increase amount of physical activity.;Long Term: Add in home exercise to make exercise part of routine and to increase amount of physical activity.;Long Term: Exercising regularly at least 3-5 days a week. Short Term: Attend rehab on a regular basis to increase amount of physical activity.;Long Term: Add in home exercise to make exercise part of routine and to increase amount of physical activity.;Long Term: Exercising regularly at least 3-5 days a week. Short Term: Attend rehab on a regular basis to increase amount of physical activity.;Long Term: Add in home exercise to make exercise part of routine and to increase amount of physical activity.;Long Term: Exercising regularly at least 3-5 days a week.     Increase Strength and Stamina Yes Yes Yes     Intervention Provide advice, education, support and counseling about physical activity/exercise needs.;Develop an individualized exercise prescription for aerobic and resistive training based on initial evaluation findings, risk stratification, comorbidities and participant's personal goals. Provide advice, education, support and counseling about physical activity/exercise needs.;Develop an individualized exercise prescription for aerobic and resistive training based on initial evaluation findings, risk stratification, comorbidities and participant's personal goals. Provide advice, education, support and counseling about physical activity/exercise needs.;Develop an individualized exercise prescription for  aerobic and resistive training based on initial evaluation findings, risk stratification, comorbidities and participant's personal goals.     Expected Outcomes Short Term: Increase workloads from initial exercise prescription for resistance, speed, and METs.;Short Term: Perform resistance training exercises routinely during rehab and add in resistance training at home;Long Term: Improve cardiorespiratory fitness, muscular endurance and strength as measured by increased METs and functional capacity (6MWT) Short Term: Increase workloads from initial exercise prescription for resistance, speed, and METs.;Short Term: Perform resistance training exercises routinely during rehab and add in resistance training at home;Long Term: Improve cardiorespiratory fitness, muscular endurance and strength as measured by increased METs and functional capacity (6MWT) Short Term: Increase workloads from initial exercise prescription for resistance, speed, and METs.;Short Term: Perform resistance training exercises routinely during rehab and add in resistance training at home;Long Term: Improve cardiorespiratory fitness, muscular endurance and strength as measured by increased METs and functional capacity (6MWT)     Able to understand and use rate of perceived exertion (RPE) scale Yes Yes Yes     Intervention Provide education and explanation on how to use RPE  scale Provide education and explanation on how to use RPE scale Provide education and explanation on how to use RPE scale     Expected Outcomes Short Term: Able to use RPE daily in rehab to express subjective intensity level;Long Term:  Able to use RPE to guide intensity level when exercising independently Short Term: Able to use RPE daily in rehab to express subjective intensity level;Long Term:  Able to use RPE to guide intensity level when exercising independently Short Term: Able to use RPE daily in rehab to express subjective intensity level;Long Term:  Able to use RPE to  guide intensity level when exercising independently     Able to understand and use Dyspnea scale Yes Yes Yes     Intervention Provide education and explanation on how to use Dyspnea scale Provide education and explanation on how to use Dyspnea scale Provide education and explanation on how to use Dyspnea scale     Expected Outcomes Short Term: Able to use Dyspnea scale daily in rehab to express subjective sense of shortness of breath during exertion;Long Term: Able to use Dyspnea scale to guide intensity level when exercising independently Short Term: Able to use Dyspnea scale daily in rehab to express subjective sense of shortness of breath during exertion;Long Term: Able to use Dyspnea scale to guide intensity level when exercising independently Short Term: Able to use Dyspnea scale daily in rehab to express subjective sense of shortness of breath during exertion;Long Term: Able to use Dyspnea scale to guide intensity level when exercising independently     Knowledge and understanding of Target Heart Rate Range (THRR) Yes Yes Yes     Intervention Provide education and explanation of THRR including how the numbers were predicted and where they are located for reference Provide education and explanation of THRR including how the numbers were predicted and where they are located for reference Provide education and explanation of THRR including how the numbers were predicted and where they are located for reference     Expected Outcomes Short Term: Able to state/look up THRR;Long Term: Able to use THRR to govern intensity when exercising independently;Short Term: Able to use daily as guideline for intensity in rehab Short Term: Able to state/look up THRR;Long Term: Able to use THRR to govern intensity when exercising independently;Short Term: Able to use daily as guideline for intensity in rehab Short Term: Able to state/look up THRR;Long Term: Able to use THRR to govern intensity when exercising  independently;Short Term: Able to use daily as guideline for intensity in rehab     Understanding of Exercise Prescription Yes Yes Yes     Intervention Provide education, explanation, and written materials on patient's individual exercise prescription Provide education, explanation, and written materials on patient's individual exercise prescription Provide education, explanation, and written materials on patient's individual exercise prescription     Expected Outcomes Short Term: Able to explain program exercise prescription;Long Term: Able to explain home exercise prescription to exercise independently Short Term: Able to explain program exercise prescription;Long Term: Able to explain home exercise prescription to exercise independently Short Term: Able to explain program exercise prescription;Long Term: Able to explain home exercise prescription to exercise independently            Exercise Goals Re-Evaluation :  Exercise Goals Re-Evaluation    Row Name 06/16/20 1201 07/14/20 0756           Exercise Goal Re-Evaluation   Exercise Goals Review Increase Physical Activity;Increase Strength and Stamina;Able to understand and use  rate of perceived exertion (RPE) scale;Able to understand and use Dyspnea scale;Knowledge and understanding of Target Heart Rate Range (THRR);Understanding of Exercise Prescription Increase Physical Activity;Increase Strength and Stamina;Able to understand and use rate of perceived exertion (RPE) scale;Able to understand and use Dyspnea scale;Knowledge and understanding of Target Heart Rate Range (THRR);Understanding of Exercise Prescription      Comments Pt has completed 1 exercise session. He exercised at 1.8 METs on the stepper. Will monitor and progress as able. Pt has attended 4 exercise sessions. He is currently on hold and has been advised not to come to pulmonary rehab. His cardiac cath that he had completed on 8/6 revealed that he has severe three vessel disease  along with aortic stenosis. He will have a consultation with a cardiothoracic surgeon next week to determine his course of treatment. Pt will likely be discharged from the program.      Expected Outcomes Through exercise at rehab and at home patient will be able to increase physical activity, strength, and stamina. Through exercise at rehab and at home, the patient will decrease shortness of breath with daily activities and feel confident in carrying out an exercise regime at home.             Discharge Exercise Prescription (Final Exercise Prescription Changes):  Exercise Prescription Changes - 06/30/20 1200      Response to Exercise   Blood Pressure (Admit) 142/78    Blood Pressure (Exercise) 146/82    Blood Pressure (Exit) 128/70    Heart Rate (Admit) 80 bpm    Heart Rate (Exercise) 91 bpm    Heart Rate (Exit) 80 bpm    Oxygen Saturation (Admit) 94 %    Oxygen Saturation (Exercise) 96 %    Oxygen Saturation (Exit) 97 %    Rating of Perceived Exertion (Exercise) 11    Perceived Dyspnea (Exercise) 1    Duration Continue with 30 min of aerobic exercise without signs/symptoms of physical distress.    Intensity THRR unchanged      Progression   Progression Continue to progress workloads to maintain intensity without signs/symptoms of physical distress.    Average METs 1.8      Resistance Training   Training Prescription Yes    Weight Blue bands    Reps 10-15    Time 10 Minutes      NuStep   Level 2    SPM 80    Minutes 30    METs 1.8           Nutrition:  Target Goals: Understanding of nutrition guidelines, daily intake of sodium <1547m, cholesterol <208m calories 30% from fat and 7% or less from saturated fats, daily to have 5 or more servings of fruits and vegetables.  Biometrics:  Pre Biometrics - 06/10/20 0945      Pre Biometrics   Grip Strength 30 kg            Nutrition Therapy Plan and Nutrition Goals:   Nutrition Assessments:   Nutrition Goals  Re-Evaluation:  Nutrition Goals Re-Evaluation    Row Name 06/16/20 1356             Goals   Current Weight 265 lb 14 oz (120.6 kg)              Nutrition Goals Discharge (Final Nutrition Goals Re-Evaluation):  Nutrition Goals Re-Evaluation - 06/16/20 1356      Goals   Current Weight 265 lb 14 oz (120.6 kg)  Psychosocial: Target Goals: Acknowledge presence or absence of significant depression and/or stress, maximize coping skills, provide positive support system. Participant is able to verbalize types and ability to use techniques and skills needed for reducing stress and depression.  Initial Review & Psychosocial Screening:  Initial Psych Review & Screening - 06/10/20 0947      Initial Review   Current issues with None Identified      Family Dynamics   Good Support System? Yes      Barriers   Psychosocial barriers to participate in program There are no identifiable barriers or psychosocial needs.      Screening Interventions   Interventions Encouraged to exercise           Quality of Life Scores:  Scores of 19 and below usually indicate a poorer quality of life in these areas.  A difference of  2-3 points is a clinically meaningful difference.  A difference of 2-3 points in the total score of the Quality of Life Index has been associated with significant improvement in overall quality of life, self-image, physical symptoms, and general health in studies assessing change in quality of life.  PHQ-9: Recent Review Flowsheet Data    Depression screen [facility] 2/9 06/10/2020   Decreased Interest 0   Down, Depressed, Hopeless 0   PHQ - 2 Score 0   Altered sleeping 0   Tired, decreased energy 0   Change in appetite 0   Feeling bad or failure about yourself  0   Trouble concentrating 0   Moving slowly or fidgety/restless 0   Suicidal thoughts 0   Difficult doing work/chores Not difficult at all     Interpretation of Total Score  Total Score Depression  Severity:  1-4 = Minimal depression, 5-9 = Mild depression, 10-14 = Moderate depression, 15-19 = Moderately severe depression, 20-27 = Severe depression   Psychosocial Evaluation and Intervention:  Psychosocial Evaluation - 06/10/20 0948      Psychosocial Evaluation & Interventions   Interventions Encouraged to exercise with the program and follow exercise prescription    Comments No barriers or psychosocial concerns identified at this time    Expected Outcomes For patient to continue to have no barriers or concerns while in pulmonary rehab    Continue Psychosocial Services  No Follow up required           Psychosocial Re-Evaluation:  Psychosocial Re-Evaluation    [place] Name 06/16/20 1157 07/15/20 1159           Psychosocial Re-Evaluation   Current issues with None Identified None Identified      Comments No concerns identified No concerns identified      Expected Outcomes For Clair Gulling to be free of psychosocial concerns and barriers while participating in pulmonary rehab. For Clair Gulling to be free of psychosocial concerns and barriers while participating in pulmonary rehab.      Interventions Encouraged to attend Pulmonary Rehabilitation for the exercise Encouraged to attend Pulmonary Rehabilitation for the exercise      Continue Psychosocial Services  No Follow up required Follow up required by staff  This staff has not had an opportunity to engage in conversation with this pt regarding his mental well being.  However pt is well known from previous participation.             Psychosocial Discharge (Final Psychosocial Re-Evaluation):  Psychosocial Re-Evaluation - 07/15/20 1159      Psychosocial Re-Evaluation   Current issues with None Identified    Comments  No concerns identified    Expected Outcomes For Clair Gulling to be free of psychosocial concerns and barriers while participating in pulmonary rehab.    Interventions Encouraged to attend Pulmonary Rehabilitation for the exercise    Continue  Psychosocial Services  Follow up required by staff   This staff has not had an opportunity to engage in conversation with this pt regarding his mental well being.  However pt is well known from previous participation.          Education: Education Goals: Education classes will be provided on a weekly basis, covering required topics. Participant will state understanding/return demonstration of topics presented.  Learning Barriers/Preferences:  Learning Barriers/Preferences - 06/10/20 0949      Learning Barriers/Preferences   Learning Barriers None    Learning Preferences Computer/Internet;Group Instruction;Individual Instruction;Audio;Pictoral;Skilled Demonstration;Verbal Instruction;Video;Written Material           Education Topics: Risk Factor Reduction:  -Group instruction that is supported by a PowerPoint presentation. Instructor discusses the definition of a risk factor, different risk factors for pulmonary disease, and how the heart and lungs work together.     Nutrition for Pulmonary Patient:  -Group instruction provided by PowerPoint slides, verbal discussion, and written materials to support subject matter. The instructor gives an explanation and review of healthy diet recommendations, which includes a discussion on weight management, recommendations for fruit and vegetable consumption, as well as protein, fluid, caffeine, fiber, sodium, sugar, and alcohol. Tips for eating when patients are short of breath are discussed.   PULMONARY REHAB OTHER RESPIRATORY from 08/15/2017 in [place]  Date 07/27/17  Educator RD  Instruction Review Code (Retired) 2- meets goals/outcomes      Pursed Lip Breathing:  -Group instruction that is supported by demonstration and informational handouts. Instructor discusses the benefits of pursed lip and diaphragmatic breathing and detailed demonstration on how to preform both.     PULMONARY REHAB OTHER RESPIRATORY  from 08/15/2017 in [place]  Date 08/10/17  Educator RT  Instruction Review Code (Retired) 2- meets goals/outcomes      Oxygen Safety:  -Group instruction provided by PowerPoint, verbal discussion, and written material to support subject matter. There is an overview of "What is Oxygen" and "Why do we need it".  Instructor also reviews how to create a safe environment for oxygen use, the importance of using oxygen as prescribed, and the risks of noncompliance. There is a brief discussion on traveling with oxygen and resources the patient may utilize.   PULMONARY REHAB OTHER RESPIRATORY from 08/15/2017 in [place]  Date 07/13/17  Educator Truddie Crumble  Instruction Review Code (Retired) R- Review/reinforce      Oxygen Equipment:  -Group instruction provided by [employer], Network engineer, and Insurance underwriter.   PULMONARY REHAB OTHER RESPIRATORY from 08/15/2017 in [place]  Date 04/27/17  Educator George[employer]  Instruction Review Code (Retired) 2- meets goals/outcomes      Signs and Symptoms:  -Group instruction provided by written material and verbal discussion to support subject matter. Warning signs and symptoms of infection, stroke, and heart attack are reviewed and when to call the physician/911 reinforced. Tips for preventing the spread of infection discussed.   PULMONARY REHAB OTHER RESPIRATORY from 08/15/2017 in [place]  Date 06/15/17  Educator rn  Instruction Review Code (Retired) 2- meets goals/outcomes      Advanced Directives:  -Group instruction  provided by verbal instruction and written material to support subject matter. Instructor reviews Advanced Directive laws and proper instruction for filling out document.   Pulmonary Video:  -Group video education that reviews the importance of medication and  oxygen compliance, exercise, good nutrition, pulmonary hygiene, and pursed lip and diaphragmatic breathing for the pulmonary patient.   Exercise for the Pulmonary Patient:  -Group instruction that is supported by a PowerPoint presentation. Instructor discusses benefits of exercise, core components of exercise, frequency, duration, and intensity of an exercise routine, importance of utilizing pulse oximetry during exercise, safety while exercising, and options of places to exercise outside of rehab.     PULMONARY REHAB OTHER RESPIRATORY from 08/15/2017 in [place]  Date 08/15/17  Educator ep  Instruction Review Code (Retired) R- Review/reinforce      Pulmonary Medications:  -Verbally interactive group education provided by instructor with focus on inhaled medications and proper administration.   PULMONARY REHAB OTHER RESPIRATORY from 08/15/2017 in [place]  Date 08/03/17  Educator Pharm  Instruction Review Code (Retired) R- Product/process development scientist and Physiology of the Respiratory System and Intimacy:  -Group instruction provided by PowerPoint, verbal discussion, and written material to support subject matter. Instructor reviews respiratory cycle and anatomical components of the respiratory system and their functions. Instructor also reviews differences in obstructive and restrictive respiratory diseases with examples of each. Intimacy, Sex, and Sexuality differences are reviewed with a discussion on how relationships can change when diagnosed with pulmonary disease. Common sexual concerns are reviewed.   PULMONARY REHAB OTHER RESPIRATORY from 08/15/2017 in [place]  Date 06/22/17  Educator RN  Instruction Review Code (Retired) 2- meets goals/outcomes      MD DAY -A group question and answer session with a medical doctor that allows participants to ask questions that relate to their  pulmonary disease state.   PULMONARY REHAB OTHER RESPIRATORY from 08/15/2017 in [place]  Date 06/08/17  Educator Nelda Marseille  Instruction Review Code (Retired) 2- meets goals/outcomes      OTHER EDUCATION -Group or individual verbal, written, or video instructions that support the educational goals of the pulmonary rehab program.   PULMONARY REHAB OTHER RESPIRATORY from 06/30/2020 in [place]  Date 06/30/20  Educator Handout  Instruction Review Code 2- Demonstrated Understanding      Holiday Eating Survival Tips:  -Group instruction provided by PowerPoint slides, verbal discussion, and written materials to support subject matter. The instructor gives patients tips, tricks, and techniques to help them not only survive but enjoy the holidays despite the onslaught of food that accompanies the holidays.   Knowledge Questionnaire Score:  Knowledge Questionnaire Score - 06/10/20 0957      Knowledge Questionnaire Score   Pre Score 15/18           Core Components/Risk Factors/Patient Goals at Admission:  Personal Goals and Risk Factors at Admission - 06/10/20 0950      Core Components/Risk Factors/Patient Goals on Admission   Improve shortness of breath with ADL's Yes    Intervention Provide education, individualized exercise plan and daily activity instruction to help decrease symptoms of SOB with activities of daily living.    Expected Outcomes Short Term: Improve cardiorespiratory fitness to achieve a reduction of symptoms when performing ADLs;Long Term: Be able to perform more ADLs without symptoms or delay the onset of symptoms  Core Components/Risk Factors/Patient Goals Review:   Goals and Risk Factor Review    Row Name 06/10/20 0950 06/16/20 1158 07/15/20 1200         Core Components/Risk Factors/Patient Goals Review   Personal Goals Review Improve shortness of breath with ADL's;Develop more  efficient breathing techniques such as purse lipped breathing and diaphragmatic breathing and practicing self-pacing with activity.;Increase knowledge of respiratory medications and ability to use respiratory devices properly. Increase knowledge of respiratory medications and ability to use respiratory devices properly.;Improve shortness of breath with ADL's;Develop more efficient breathing techniques such as purse lipped breathing and diaphragmatic breathing and practicing self-pacing with activity. Increase knowledge of respiratory medications and ability to use respiratory devices properly.;Improve shortness of breath with ADL's;Develop more efficient breathing techniques such as purse lipped breathing and diaphragmatic breathing and practicing self-pacing with activity.;Diabetes     Review -- Today was his first day of exercise, he did well, too early to have met any goals. Pt has completed 4 exercise sessions and is presently on hold pending upcoming consult with the heart surgeon in response to cath report.  Based upon reported blood glucose reading pre and post exercise, pt demonstrates adequate management of his diabetes.   Pt is very familiar with utilizing efficent breathing techniques such as PLB and diaphragmatic breathing while pacing himself during activity.     Expected Outcomes -- See admission goals. See admission goals.            Core Components/Risk Factors/Patient Goals at Discharge (Final Review):   Goals and Risk Factor Review - 07/15/20 1200      Core Components/Risk Factors/Patient Goals Review   Personal Goals Review Increase knowledge of respiratory medications and ability to use respiratory devices properly.;Improve shortness of breath with ADL's;Develop more efficient breathing techniques such as purse lipped breathing and diaphragmatic breathing and practicing self-pacing with activity.;Diabetes    Review Pt has completed 4 exercise sessions and is presently on hold pending  upcoming consult with the heart surgeon in response to cath report.  Based upon reported blood glucose reading pre and post exercise, pt demonstrates adequate management of his diabetes.   Pt is very familiar with utilizing efficent breathing techniques such as PLB and diaphragmatic breathing while pacing himself during activity.    Expected Outcomes See admission goals.           ITP Comments:  ITP Comments    Row Name 07/15/20 1127 07/15/20 1155         ITP Comments Dr. Rodman Pickle, Medical Director [facility] Dr. Rodman Pickle, Medical Director [facility]             Comments:  Clair Gulling has completed 5 exercise session in Pulmonary rehab. Pt is presently on a medical hold. Pulmonary rehab staff will  continue to monitor and reassess progress toward goals during her participation in Pulmonary Rehab. Cherre Huger, BSN Cardiac and Training and development officer

## 2020-07-16 ENCOUNTER — Encounter (HOSPITAL_COMMUNITY): Payer: Medicare Other

## 2020-07-17 ENCOUNTER — Telehealth: Payer: Self-pay | Admitting: Internal Medicine

## 2020-07-17 ENCOUNTER — Other Ambulatory Visit: Payer: Self-pay

## 2020-07-17 ENCOUNTER — Emergency Department (HOSPITAL_COMMUNITY)
Admission: EM | Admit: 2020-07-17 | Discharge: 2020-07-17 | Disposition: A | Discharge status: Home / Self Care | Payer: Medicare Other | Attending: Emergency Medicine | Admitting: Emergency Medicine

## 2020-07-17 ENCOUNTER — Encounter (HOSPITAL_COMMUNITY): Payer: Self-pay

## 2020-07-17 ENCOUNTER — Emergency Department (HOSPITAL_COMMUNITY): Payer: Medicare Other

## 2020-07-17 DIAGNOSIS — I25118 Atherosclerotic heart disease of native coronary artery with other forms of angina pectoris: Secondary | ICD-10-CM | POA: Insufficient documentation

## 2020-07-17 DIAGNOSIS — U071 COVID-19: Secondary | ICD-10-CM | POA: Diagnosis not present

## 2020-07-17 DIAGNOSIS — I129 Hypertensive chronic kidney disease with stage 1 through stage 4 chronic kidney disease, or unspecified chronic kidney disease: Secondary | ICD-10-CM | POA: Insufficient documentation

## 2020-07-17 DIAGNOSIS — N183 Chronic kidney disease, stage 3 unspecified: Secondary | ICD-10-CM | POA: Diagnosis not present

## 2020-07-17 DIAGNOSIS — R079 Chest pain, unspecified: Secondary | ICD-10-CM

## 2020-07-17 DIAGNOSIS — Z950 Presence of cardiac pacemaker: Secondary | ICD-10-CM | POA: Insufficient documentation

## 2020-07-17 DIAGNOSIS — E113512 Type 2 diabetes mellitus with proliferative diabetic retinopathy with macular edema, left eye: Secondary | ICD-10-CM | POA: Insufficient documentation

## 2020-07-17 DIAGNOSIS — R0602 Shortness of breath: Secondary | ICD-10-CM | POA: Diagnosis present

## 2020-07-17 DIAGNOSIS — Z79899 Other long term (current) drug therapy: Secondary | ICD-10-CM | POA: Insufficient documentation

## 2020-07-17 LAB — CBC
HCT: 41.2 % (ref 39.0–52.0)
Hemoglobin: 14 g/dL (ref 13.0–17.0)
MCH: 31.3 pg (ref 26.0–34.0)
MCHC: 34 g/dL (ref 30.0–36.0)
MCV: 92 fL (ref 80.0–100.0)
Platelets: 193 10*3/uL (ref 150–400)
RBC: 4.48 MIL/uL (ref 4.22–5.81)
RDW: 12.7 % (ref 11.5–15.5)
WBC: 6.8 10*3/uL (ref 4.0–10.5)
nRBC: 0 % (ref 0.0–0.2)

## 2020-07-17 LAB — BASIC METABOLIC PANEL
Anion gap: 10 (ref 5–15)
BUN: 23 mg/dL (ref 8–23)
CO2: 25 mmol/L (ref 22–32)
Calcium: 8.8 mg/dL — ABNORMAL LOW (ref 8.9–10.3)
Chloride: 96 mmol/L — ABNORMAL LOW (ref 98–111)
Creatinine, Ser: 1.45 mg/dL — ABNORMAL HIGH (ref 0.61–1.24)
GFR calc Af Amer: 53 mL/min — ABNORMAL LOW (ref >60)
GFR calc non Af Amer: 45 mL/min — ABNORMAL LOW (ref >60)
Glucose, Bld: 174 mg/dL — ABNORMAL HIGH (ref 70–99)
Potassium: 4.9 mmol/L (ref 3.5–5.1)
Sodium: 131 mmol/L — ABNORMAL LOW (ref 135–145)

## 2020-07-17 LAB — TRIGLYCERIDES: Triglycerides: 329 mg/dL — ABNORMAL HIGH (ref <150)

## 2020-07-17 LAB — HEPATIC FUNCTION PANEL
ALT: 21 U/L (ref 0–44)
AST: 24 U/L (ref 15–41)
Albumin: 3.3 g/dL — ABNORMAL LOW (ref 3.5–5.0)
Alkaline Phosphatase: 68 U/L (ref 38–126)
Bilirubin, Direct: 0.1 mg/dL (ref 0.0–0.2)
Indirect Bilirubin: 0.9 mg/dL (ref 0.3–0.9)
Total Bilirubin: 1 mg/dL (ref 0.3–1.2)
Total Protein: 7.5 g/dL (ref 6.5–8.1)

## 2020-07-17 LAB — LACTIC ACID, PLASMA: Lactic Acid, Venous: 2 mmol/L (ref 0.5–1.9)

## 2020-07-17 LAB — TROPONIN I (HIGH SENSITIVITY)
Troponin I (High Sensitivity): 24 ng/L — ABNORMAL HIGH (ref <18)
Troponin I (High Sensitivity): 26 ng/L — ABNORMAL HIGH (ref <18)

## 2020-07-17 LAB — LACTATE DEHYDROGENASE: LDH: 179 U/L (ref 98–192)

## 2020-07-17 LAB — FERRITIN: Ferritin: 145 ng/mL (ref 24–336)

## 2020-07-17 LAB — C-REACTIVE PROTEIN: CRP: 4.1 mg/dL — ABNORMAL HIGH (ref <1.0)

## 2020-07-17 LAB — FIBRINOGEN: Fibrinogen: 473 mg/dL (ref 210–475)

## 2020-07-17 LAB — PROCALCITONIN: Procalcitonin: <0.1 ng/mL

## 2020-07-17 LAB — BRAIN NATRIURETIC PEPTIDE: B Natriuretic Peptide: 210.9 pg/mL — ABNORMAL HIGH (ref 0.0–100.0)

## 2020-07-17 LAB — D-DIMER, QUANTITATIVE: D-Dimer, Quant: 0.79 ug/mL-FEU — ABNORMAL HIGH (ref 0.00–0.50)

## 2020-07-17 LAB — SARS CORONAVIRUS 2 BY RT PCR (HOSPITAL ORDER, PERFORMED IN ~~LOC~~ HOSPITAL LAB): SARS Coronavirus 2: POSITIVE — AB

## 2020-07-17 NOTE — Discharge Instructions (Addendum)
You are leaving to go be admitted at [facility]. I hope you feel better. Thank you for allowing me to care for you today. Please return to the emergency department if you have new or worsening symptoms.

## 2020-07-17 NOTE — ED Provider Notes (Signed)
Medical screening examination/treatment/procedure(s) were conducted as a shared visit with non-physician practitioner(s) and myself.  I personally evaluated the patient during the encounter.  EKG Interpretation  Date/Time:  Friday July 17 2020 12:07:49 EDT Ventricular Rate:  101 PR Interval:  152 QRS Duration: 150 QT Interval:  384 QTC Calculation: 497 R Axis:   -37 Text Interpretation: Sinus tachycardia Left axis deviation Right bundle branch block T wave abnormality, consider inferolateral ischemia Abnormal ECG Confirmed by Lacretia Leigh ([id number]) on 07/17/2020 2:9:24 PM   79 year old male who was diagnosed with Covid this morning by his PCP.  Has had a cough, fatigue and shortness of breath.  I spoke with patient's pulmonologist, Dr. Chase Caller, who requested patient be started on monoclonal antibody.  However the patient and his wife are now requesting that they leave as they have a bed at [place].  Patient to be discharged   Lacretia Leigh, MD 07/17/20 [phone]

## 2020-07-17 NOTE — ED Notes (Signed)
Pt dropped to 90% RA while ambulating.

## 2020-07-17 NOTE — ED Notes (Signed)
Wife requesting to take pt to [facility] because he has an innidiate room assignment,  She states pt is here and we are not doing enough for him and he is sick so she will take him to [place]. All labs done on pt and waiting for results, pt oriented multiple times that he will be NPO until provider let him have something to drink or eat, pt verbalized understanding every time we explained the routings to him. Pt is on bed resting comfortable, no acute distress noticed while in the room pt connected to the continues monitor at all the time.

## 2020-07-17 NOTE — ED Triage Notes (Signed)
Pt arrives POV for eval of Covid sx. Pt reports that he tested positive for covid this AM and was sent by PCP. Worsening SOB, cough, fatigue. Pt w./ hx of pulmonary fibrosis, blt PNA and a recent heart cath per PCP. Pt is currently receiving exp drug for pulm fibrosis, last infusion Monday. Pt is vaccinated.

## 2020-07-17 NOTE — Telephone Encounter (Signed)
Responded to call to research office from wife few to several hours ago - she expresssed that [employer] had covid and wondered about possible exposure at infusion center during research infusion . After multiple attempts trying to reach him on cell and wife on cell as well. One point around 5pm she said she would call back I finally got hold of patient approx 20 -30 mon ago .   Advised that conversation between myself and ER doc and ID was about him getting Regn MAB. HE said he is now an inpatient at [facility]. He said he will talk to docs there. Michela Pitcher he got a direct admit to [facility] through his PCP Burnard Bunting, MD . He is currently on Room air. Advised his MD there can call us but he is in good hands  Listened to his concerns about possible covid exposure at health system during infusion Rx on 07/13/20 Monday. I reported that I  pass his concerns onto health at work and infection prevention teams - Drs  [name] and Baxter Flattery have been informed   He acknowledged understanding  Wished him speedy recovery and to reach out if any concerns     SIGNATURE    Dr. Brand Males, M.D., F.C.C.P,  Pulmonary and Critical Care Medicine Staff Physician, [place] Director - Interstitial Lung Disease  Program  Pulmonary [place] at [place], [place], [id number]  Pager: [phone], If no answer  OR between  19:00-7:00h: page [phone] Telephone (clinical office): [phone] Telephone (research): [phone]  6:01 PM 07/17/2020

## 2020-07-17 NOTE — ED Provider Notes (Signed)
[facility] EMERGENCY DEPARTMENT Provider Note   CSN: [id number] Arrival date & time: 07/17/20  1145     History Chief Complaint  Patient presents with  . Covid +  . Shortness of Breath    [place] is a 79 y.o. male.  Patient is a 79 year old gentleman with multiple medical problems including pulmonary fibrosis, coronary artery disease, CKD, hyperlipidemia, hypertension who presents the emergency department for worsening shortness of breath and positive Covid test.  Patient reports he has had worsening shortness of breath since this last Wednesday.  Denies any fever, chills, nausea, vomiting, chest pain.  Had a positive Covid test today.  He is involved in a clinical research trial for his medication for his pulmonary fibrosis and believes he may have got it when he had come in for an infusion.  He does have a pulse ox at home and reports that his oxygen's are dropping as low as 94%.  He also reports that he was recently diagnosed with worsening coronary artery disease on cardiac catheterization and was supposed to follow-up with cardiology to discuss a bypass surgery. Although he denies current chest pain        Past Medical History:  Diagnosis Date  . 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 Cataract   . Chronic kidney disease    d/t DM  . CKD (chronic kidney disease), stage III   . Diabetes mellitus    Vgo disposal insulin bolus  simular to insulin pump  . Dyspnea   . GERD (gastroesophageal reflux disease)   . Hyperlipidemia   . Hypertension   . Idiopathic pulmonary fibrosis ([place]) 11/2016  . ILD (interstitial lung disease) ([place])   . Moderate aortic stenosis    a. 10/2019 Echo: EF 55-60%, Gr2 DD. Nl RV.   Marland Kitchen Neuromuscular disorder ([place])   . Nonobstructive CAD (coronary artery disease)    a. 2012 Cath: mod, nonobs dzs; b. 10/2016 MV: EF 60%, no ischemia.  Marland Kitchen PONV (postoperative nausea and vomiting)   . Sleep apnea     uses cpap asked to bring mask and tubing     Patient Active Problem List   Diagnosis Date Noted  . Coronary artery disease with exertional angina ([place]) 07/10/2020  . Chronic kidney disease (CKD), stage III (moderate) 03/29/2020  . 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 Right epiretinal membrane 03/16/2020  . Posterior vitreous detachment of right eye 03/16/2020  . Aortic stenosis, moderate 03/12/2020  . 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 Near syncope 04/27/2018  . Hypoglycemia due to insulin 01/06/2018  . Essential hypertension 01/06/2018  . Diabetes mellitus type 2, with complication, on long term insulin pump ([place]) 01/06/2018  . Syncope and collapse 01/05/2018  . Encounter for therapeutic drug monitoring 06/29/2017  . Obstructive sleep apnea 05/05/2017  . 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[place]) 01/05/2017  . Abnormal chest x-ray 10/12/2016  . 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 ANTERIOR FUSION CERVICAL SPINE  2012  . CARDIAC CATHETERIZATION  2011  . CARDIAC CATHETERIZATION N/A 11/09/2016   Procedure: Right Heart Cath;  Surgeon: Belva Crome, MD;  Location: [facility];  Service: Cardiovascular;  Laterality: N/A;  . carpel tunnel  left wrist  . CATARACT EXTRACTION    . CATARACT EXTRACTION W/ INTRAOCULAR LENS  IMPLANT, BILATERAL  2013  . CERVICAL LAMINECTOMY  2012  . COLONOSCOPY N/A 01/14/2013   Procedure: COLONOSCOPY;  Surgeon: Irene Shipper, MD;  Location: [facility];  Service: Endoscopy;  Laterality: N/A;  . EYE SURGERY    . [place]   left  . LEFT HEART CATH AND CORONARY ANGIOGRAPHY N/A 07/10/2020   Procedure: LEFT HEART CATH AND CORONARY ANGIOGRAPHY;  Surgeon: Sherren Mocha, MD;  Location: [facility];  Service: Cardiovascular;  Laterality: N/A;  . LUMBAR LAMINECTOMY  2003  . LUNG BIOPSY Left 12/26/2016   Procedure: LUNG BIOPSY;  Surgeon: [name], MD;  Location: [facility];  Service: Thoracic;  Laterality: Left;  . PACEMAKER IMPLANT N/A 03/30/2020   Procedure: PACEMAKER IMPLANT;  Surgeon: Evans Lance, MD;  Location: [facility];  Service: Cardiovascular;  Laterality: N/A;  . POSTERIOR FUSION CERVICAL SPINE  2012  . TRIGGER FINGER RELEASE  2011   4th finger left hand  . VIDEO ASSISTED THORACOSCOPY Left 12/26/2016   Procedure: VIDEO ASSISTED THORACOSCOPY;  Surgeon: [name], MD;  Location: [facility];  Service: Thoracic;  Laterality: Left;  Marland Kitchen VIDEO BRONCHOSCOPY N/A 12/26/2016   Procedure: VIDEO BRONCHOSCOPY;  Surgeon: [name], MD;  Location: [facility];  Service: Thoracic;  Laterality: N/A;       Family History  Problem Relation Age of Onset  . Diabetes Mellitus II Mother   . Emphysema Father 89  . Colon cancer Neg Hx   . Esophageal cancer Neg Hx   . Rectal cancer Neg Hx   . Stomach cancer Neg Hx     Social History   Tobacco Use  . Smoking status: Never Smoker  . Smokeless tobacco: Never Used  Vaping Use  . Vaping Use: Never used  Substance Use Topics  . Alcohol use: No  . Drug use: No    Home Medications Prior to Admission medications   Medication Sig Start Date End Date Taking? Authorizing Provider  albuterol (PROAIR HFA) 108 (90 Base) MCG/ACT inhaler Inhale 2 puffs into the lungs every 4 (four) hours as needed for wheezing or shortness of breath.    [provider]  Ascorbic Acid (VITAMIN C) 1000 MG tablet Take 1,000 mg by mouth daily.    [provider]  aspirin EC 81 MG tablet Take 81 mg by mouth daily.    [provider]  clotrimazole-betamethasone (LOTRISONE) cream Apply 1 application topically daily as needed (for skin infection).  01/16/17   [provider]  Continuous Blood Gluc  Sensor (FREESTYLE LIBRE 14 DAY SENSOR) MISC 1 patch every 14 (fourteen) days.    [provider]  EPINEPHrine 0.3 mg/0.3 mL IJ SOAJ injection Inject 0.3 mg into the muscle once as needed for anaphylaxis.  01/30/18   [provider]  furosemide (LASIX) 40 MG tablet Take 20 mg by mouth daily.  02/15/18   [provider]  insulin detemir (LEVEMIR) 100 UNIT/ML injection Inject 12 Units into the skin at bedtime.     [provider]  insulin lispro (HUMALOG) 100 UNIT/ML injection Inject 76 Units into the skin daily. Use as directed with V-Go 40 insulin pump    [provider]  ketotifen (ZADITOR) 0.025 % ophthalmic solution Place 1 drop into both eyes 2 (two) times daily as needed (allergies).     [provider]  LORazepam (  ATIVAN) 1 MG tablet Take 1 mg by mouth at bedtime as needed for sleep.  03/20/20   [provider]  losartan (COZAAR) 50 MG tablet Take 50 mg by mouth daily.    [provider]  Magnesium Oxide 400 MG CAPS Take 1 capsule (400 mg total) by mouth daily. 04/02/20   Brand Males, MD  metoprolol succinate (TOPROL-XL) 50 MG 24 hr tablet Take 1 tablet (50 mg total) by mouth daily. Take with or immediately following a meal. 07/08/20   Evans Lance, MD  Multiple Vitamin (MULTIVITAMIN WITH MINERALS) TABS tablet Take 1 tablet by mouth daily.    [provider]  pantoprazole (PROTONIX) 40 MG tablet Take 40 mg by mouth daily.      [provider]  potassium chloride SA (KLOR-CON) 20 MEQ tablet Take 20 mEq by mouth daily.     [provider]    Allergies    Codeine, Other, and Ofev [nintedanib]  Review of Systems   Review of Systems  Constitutional: Positive for fatigue. Negative for appetite change, chills and fever.  HENT: Negative.   Respiratory: Positive for cough and shortness of breath.   Cardiovascular: Negative for chest pain, palpitations and leg swelling.  Gastrointestinal:  Negative.   Skin: Negative for rash.  Allergic/Immunologic: Positive for immunocompromised state.  Neurological: Negative for syncope.  All other systems reviewed and are negative.   Physical Exam Updated Vital Signs BP (!) 158/101   Pulse 87   Temp 99 F (37.2 C) (Oral)   Resp 20   Ht 6' (1.829 m)   Wt 120.2 kg   SpO2 96%   BMI 35.94 kg/m   Physical Exam Vitals and nursing note reviewed.  Constitutional:      Appearance: Normal appearance. He is obese. He is not diaphoretic.     Comments: Appears uncomfortable  HENT:     Head: Normocephalic.     Mouth/Throat:     Mouth: Mucous membranes are moist.  Eyes:     Conjunctiva/sclera: Conjunctivae normal.     Pupils: Pupils are equal, round, and reactive to light.  Cardiovascular:     Rate and Rhythm: Normal rate and regular rhythm.  Pulmonary:     Effort: Accessory muscle usage present.     Comments: Course lung sounds bilaterally  Chest:     Chest wall: No mass or deformity.  Musculoskeletal:     Right lower leg: No edema.     Left lower leg: No edema.  Skin:    General: Skin is warm and dry.  Neurological:     General: No focal deficit present.     Mental Status: He is alert.  Psychiatric:        Mood and Affect: Mood normal.     ED Results / Procedures / Treatments   Labs (all labs ordered are listed, but only abnormal results are displayed) Labs Reviewed  BASIC METABOLIC PANEL - Abnormal; Notable for the following components:      Result Value   Sodium 131 (*)    Chloride 96 (*)    Glucose, Bld 174 (*)    Creatinine, Ser 1.45 (*)    Calcium 8.8 (*)    GFR calc non Af Amer 45 (*)    GFR calc Af Amer 53 (*)    All other components within normal limits  BRAIN NATRIURETIC PEPTIDE - Abnormal; Notable for the following components:   B Natriuretic Peptide 210.9 (*)    All other  components within normal limits  LACTIC ACID, PLASMA - Abnormal; Notable for the following components:   Lactic Acid, Venous 2.0  (*)    All other components within normal limits  HEPATIC FUNCTION PANEL - Abnormal; Notable for the following components:   Albumin 3.3 (*)    All other components within normal limits  TRIGLYCERIDES - Abnormal; Notable for the following components:   Triglycerides 329 (*)    All other components within normal limits  TROPONIN I (HIGH SENSITIVITY) - Abnormal; Notable for the following components:   Troponin I (High Sensitivity) 24 (*)    All other components within normal limits  TROPONIN I (HIGH SENSITIVITY) - Abnormal; Notable for the following components:   Troponin I (High Sensitivity) 26 (*)    All other components within normal limits  CULTURE, BLOOD (ROUTINE X 2)  CULTURE, BLOOD (ROUTINE X 2)  SARS CORONAVIRUS 2 BY RT PCR (HOSPITAL ORDER, [facility])  CBC  LACTATE DEHYDROGENASE  LACTIC ACID, PLASMA  D-DIMER, QUANTITATIVE (NOT AT [facility])  FERRITIN  FIBRINOGEN  C-REACTIVE PROTEIN  PROCALCITONIN    EKG EKG Interpretation  Date/Time:  Friday July 17 2020 12:07:49 EDT Ventricular Rate:  101 PR Interval:  152 QRS Duration: 150 QT Interval:  384 QTC Calculation: 497 R Axis:   -37 Text Interpretation: Sinus tachycardia Left axis deviation Right bundle branch block T wave abnormality, consider inferolateral ischemia Abnormal ECG Confirmed by Lacretia Leigh ([id number]) on 07/17/2020 2:55:46 PM   Radiology DG Chest Port 1 View  Result Date: 07/17/2020 CLINICAL DATA:  Cough and shortness of breath. Reported COVID-19 positive EXAM: PORTABLE CHEST 1 VIEW COMPARISON:  March 31, 2020 chest radiograph and chest CT September 30, 2019 FINDINGS: There is grossly stable fibrotic change throughout the left lung with scattered fibrosis in the periphery of the right mid and lower lung regions. There is mild chronic pleural thickening on the left. No new opacity is appreciable. Heart is borderline prominent with pacemaker leads attached to the right atrium and right  ventricle. No adenopathy. There is postoperative change in the lower cervical region. No adenopathy evident. IMPRESSION: Multilevel fibrosis, more on the left than on the right with apparent chronic pleural thickening on the left peripherally, essentially stable. No airspace opacity evident; subtle airspace opacity could easily be obscured by this degree of fibrosis, particularly on the left. Stable cardiac silhouette. Pacemaker leads attached to right atrium and right ventricle. Electronically Signed   By: Lowella Grip III M.D.   On: 07/17/2020 15:24    Procedures Procedures (including critical care time)  Medications Ordered in ED Medications - No data to display  ED Course  I have reviewed the triage vital signs and the nursing notes.  Pertinent labs & imaging results that were available during my care of the patient were reviewed by me and considered in my medical decision making (see chart for details).  Clinical Course as of Jul 17 1614  Fri Jul 17, 2020  1541 Patient presenting to the ER with SOB, covid positive. After evaluating the patient and speaking with his pulmonologist we were informed that the patient's wife arranged for the patient to be admitted to [facility] and he was accepted so they would like to leave to go there. Patient is stable and would like to go to [facility] instead. Patient will be d/c per his and his wife's wishes and complete their care are [facility]. They were advised they may return at  any time.   [KM]    Clinical Course User Index [KM] Kristine Royal   MDM Rules/Calculators/A&P                          Based on review of vitals, medical screening exam, lab work and/or imaging, there does not appear to be an acute, emergent etiology for the patient's symptoms. Counseled pt on good return precautions and encouraged both PCP and ED follow-up as needed.  Prior to discharge, I also discussed incidental imaging findings with  patient in detail and advised appropriate, recommended follow-up in detail.  Clinical Impression: 1. COVID-19   2. Chest pain     Disposition: Discharge  Prior to providing a prescription for a controlled substance, I independently reviewed the patient's recent prescription history on the [place]. The patient had no recent or regular prescriptions and was deemed appropriate for a brief, less than 3 day prescription of narcotic for acute analgesia.  This note was prepared with assistance of Systems analyst. Occasional wrong-word or sound-a-like substitutions may have occurred due to the inherent limitations of voice recognition software.  Final Clinical Impression(s) / ED Diagnoses Final diagnoses:  [id number]    Rx / DC Orders ED Discharge Orders    None       Kristine Royal 07/17/20 1615    Lacretia Leigh, MD 07/18/20 1416

## 2020-07-17 NOTE — Telephone Encounter (Signed)
Got information that Shane [name]   [birth year] is covid positive on the outside. REviewed ER chart. D/w Dr Baxter Flattery of ID and subsequently with Dr Vivi Martens of ER   Plan (as d/w Dr Zenia Resides and Baxter Flattery)  - If cone PCR for covid +ve (AND) patient is normoxic on room air at rest (as he is currently now) -> he qualifies for Regeneron MAb (as long as no contraindication) even if he gets admitted   - rest per protocol for covid  - will inform research sponsor per research protocol      SIGNATURE    Dr. Brand Males, M.D., F.C.C.P,  Pulmonary and Critical Care Medicine Staff Physician, [place] Director - Interstitial Lung Disease  Program  Pulmonary [place] at [place], [place], [address]  Pager: [phone], If no answer  OR between  19:00-7:00h: page [phone] Telephone (clinical office): [phone] Telephone (research): [phone]  3:36 PM 07/17/2020

## 2020-07-17 NOTE — ED Notes (Signed)
Shane [name]- wife would like updates [phone]

## 2020-07-18 DIAGNOSIS — U071 COVID-19: Secondary | ICD-10-CM | POA: Insufficient documentation

## 2020-07-18 HISTORY — DX: COVID-19: U07.1

## 2020-07-20 ENCOUNTER — Encounter: Payer: Medicare Other | Admitting: Surgery

## 2020-07-21 ENCOUNTER — Encounter (HOSPITAL_COMMUNITY): Payer: Medicare Other

## 2020-07-22 LAB — CULTURE, BLOOD (ROUTINE X 2)
Culture: NO GROWTH
Special Requests: ADEQUATE

## 2020-07-23 ENCOUNTER — Encounter (HOSPITAL_COMMUNITY): Payer: Medicare Other

## 2020-07-28 ENCOUNTER — Encounter (HOSPITAL_COMMUNITY): Payer: Medicare Other

## 2020-07-29 ENCOUNTER — Telehealth (HOSPITAL_COMMUNITY): Payer: Self-pay | Admitting: *Deleted

## 2020-07-29 NOTE — Telephone Encounter (Signed)
Called pt to discuss Pulmonary Rehab. He has been out due to heart cath and Covid. We discussed discharging him from the program. He has not been to see the CV surgeon yet and [facility] is being considered.He agrees that discharge is the best option.

## 2020-07-30 ENCOUNTER — Encounter (HOSPITAL_COMMUNITY): Payer: Medicare Other

## 2020-08-03 ENCOUNTER — Ambulatory Visit (HOSPITAL_COMMUNITY)
Admission: RE | Admit: 2020-08-03 | Discharge: 2020-08-03 | Disposition: A | Discharge status: Home / Self Care | Payer: Medicare Other | Source: Ambulatory Visit | Attending: Internal Medicine | Admitting: Internal Medicine

## 2020-08-03 ENCOUNTER — Other Ambulatory Visit: Payer: Self-pay

## 2020-08-03 DIAGNOSIS — Z006 Encounter for examination for normal comparison and control in clinical research program: Secondary | ICD-10-CM | POA: Insufficient documentation

## 2020-08-03 DIAGNOSIS — J84112 Idiopathic pulmonary fibrosis: Secondary | ICD-10-CM

## 2020-08-03 MED ORDER — STUDY - FIBROGEN - PAMREVLUMAB 10 MG/ML IV INFUSION (OPEN LABEL) (PI-RAMASWAMY)
30.0000 mg/kg | Freq: Once | INTRAVENOUS | Status: AC
  Administered 2020-08-03: 3670 mg via INTRAVENOUS
  Filled 2020-08-03: qty 367

## 2020-08-03 NOTE — Research (Addendum)
Title: [id number] (FibroGen Study) is a Phase 3, randomized, double-blind, placebo-controlled multicenter international study to evaluate evaluate the efficacy and safety of 30 mg/kg IV infusions of pamrevlumab administered every 3 weeks for 52 weeks as compared to placebo in subjects with Idiopathic Pulmonary Fibrosis. Primary end point is: change in FVC from baseline at week [place] / Research RN note : This visit for Subject Shane [name] with DOB: 19-Apr-1941 on 08/03/2020 for the above protocol is Visit/Encounter # EX Week 67  and is for purpose of research. Subject expressed continued interest and consent in continuing as a study subject. Subject confirmed that there was no change in contact information (e.g. address, telephone, email). Subject thanked for participation in research and contribution to science.   Visit completed per the above mentioned protocol requirements. Subject stated he is still having a cough and fatigue from recent COVID Pneumonia infection but that he feels this is improving daily. This has been recorded as an SAE and reported to the sponsor.  Subject denies any new problems at this time.  Subject confirms he is still taking the increased dose of magnesium (400mg  BID) prescribed during his recent admission.  Per subject, no decision has been made regarding if he will proceed with the recommended bypass surgery. He states he has an appointment with is doctor to discuss further.   Please refer to the subjects paper source binder for further details of the visit. The subject will return in approximately 3 weeks for his next infusion.   Signed by, Unity Bing, CMA, BS, [place] Coordinator [place], [place] 12:10 PM 08/03/2020

## 2020-08-03 NOTE — Research (Addendum)
Late addendum created so PI can add attestation.  IPF-PRO Registry  Purpose: To collect data and biological samples that will support future research studies.  The goal of the registry is to research the current approaches to diagnosis, treatment, and progression of IPF. In addition, the registry will analyze participant characteristics, assess quality of life, describe participants interactions with the health care system, determine IPF treatment practices across multiple institutions, and utilize biological samples to identify disease biomarkers.   Clinical Research Coordinator / Research RN note : This visit for Subject Shane [name] with DOB: 10-28-1941 on 08/03/2020 for the above protocol is Visit/Encounter # 42 Month Follow-up  and is for purpose of research. The consent for this encounter is under Protocol VersionAmendment 4 (12Sep2019) and is currently IRB approved. Subject expressed continued interest and consent in continuing as a study subject. Subject confirmed that there was no change in contact information (e.g. address, telephone, email). Subject thanked for participation in research and contribution to science.   During this visit on 08/03/2020, the subject completed the blood work and questionnaires per the above referenced protocol. Please refer to the subject's paper source binder for further details.  Signed by  [place] Assistant [facility]  [place], [place] 10:05 AM 08/03/2020

## 2020-08-04 ENCOUNTER — Encounter (HOSPITAL_COMMUNITY): Payer: Medicare Other

## 2020-08-06 ENCOUNTER — Encounter (HOSPITAL_COMMUNITY): Payer: Medicare Other

## 2020-08-07 ENCOUNTER — Encounter: Payer: Medicare Other | Admitting: Student

## 2020-08-08 ENCOUNTER — Other Ambulatory Visit: Payer: Self-pay | Admitting: Internal Medicine

## 2020-08-11 ENCOUNTER — Encounter (HOSPITAL_COMMUNITY): Payer: Medicare Other

## 2020-08-13 ENCOUNTER — Other Ambulatory Visit: Payer: Self-pay

## 2020-08-13 ENCOUNTER — Institutional Professional Consult (permissible substitution): Payer: Medicare Other | Admitting: Surgery

## 2020-08-13 ENCOUNTER — Encounter: Payer: Self-pay | Admitting: Surgery

## 2020-08-13 ENCOUNTER — Encounter (HOSPITAL_COMMUNITY): Payer: Medicare Other

## 2020-08-13 VITALS — BP 143/56 | HR 78 | Temp 97.7°F | Resp 20 | Ht 72.0 in | Wt 264.0 lb

## 2020-08-13 DIAGNOSIS — I2510 Atherosclerotic heart disease of native coronary artery without angina pectoris: Secondary | ICD-10-CM | POA: Diagnosis not present

## 2020-08-14 ENCOUNTER — Encounter: Payer: Self-pay | Admitting: Surgery

## 2020-08-14 NOTE — Progress Notes (Signed)
Cardiothoracic Surgery Consultation  PCP is Burnard Bunting, MD Referring Provider is Sherren Mocha, MD  Chief Complaint  Patient presents with  . Coronary Artery Disease    Surgical consult, Cardiac Cath 07/10/20, ECHO 03/29/20    HPI:  The patient is a 79 year old gentleman with a history of hypertension, hyperlipidemia, idiopathic pulmonary fibrosis followed by Dr. 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  2D echocardiogram 07/16/2019 showed moderate aortic stenosis with a mean gradient of 17 mmHg and a peak gradient of 30 mmHg with a valve area of 1.05 sq cm.  There was mild ascending aortic dilatation at 42 mm.  Left ventricular ejection fraction was 55 to 60%.  He had a follow-up echocardiogram in April 2021 which showed an aortic valve mean gradient of 18 mmHg and a valve area of 1.23 sq cm with no change in his normal left ventricular systolic function.  He now presents with a couple month history of progressive exertional angina that has been occurring with minimal exertion and resolving with rest.  This does not typically happen with walking around his house unless he starts exerting himself.  If he gets outside and walks or goes up hills he has chest discomfort promptly.  He underwent cardiac catheterization on 07/10/2020 which showed diffuse calcific stenosis of the left main that is 60%.  The LAD has 50% proximal, 70% mid, 90% apical stenosis.  There is 80% ostial to proximal left circumflex stenosis.  The right coronary artery is a large dominant vessel with 90% proximal and 75% mid vessel stenosis.  There is no significant pullback gradient across the aortic valve although it was moderately difficult to cross with a wire.  The patient has a Copywriter, advertising and continues to work.  He lives with his wife who is here with him today.   He reports  developing a cough and feeling poorly about 3 weeks ago and was diagnosed with COVID-19.  He said that he was treated with an intravenous medication after admission to [facility] through Dr. [name].  He said that he never developed hypoxemia and was discharged after few days.  He had been vaccinated in January February of this year.  He has a long history of idiopathic pulmonary fibrosis that has been stable over the past few years.  He has been on a couple drug studies and just recently started a study with infusions of pamrevlumab every 3 weeks for 52 weeks. Past Medical History:  Diagnosis Date  . 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 Cataract   . Chronic kidney disease    d/t DM  . CKD (chronic kidney disease), stage III   . Diabetes mellitus    Vgo disposal insulin bolus  simular to insulin pump  . Dyspnea   . GERD (gastroesophageal reflux disease)   . Hyperlipidemia   . Hypertension   . Idiopathic pulmonary fibrosis ([place]) 11/2016  . ILD (interstitial lung disease) ([place])   . Moderate aortic stenosis    a. 10/2019 Echo: EF 55-60%, Gr2 DD. Nl RV.   Marland Kitchen Neuromuscular disorder ([place])   . Nonobstructive CAD (coronary artery disease)    a. 2012 Cath: mod, nonobs dzs; b. 10/2016 MV: EF 60%, no ischemia.  Marland Kitchen PONV (postoperative nausea and vomiting)   . Sleep apnea     uses cpap asked to bring mask and tubing  Past Surgical History:  Procedure Laterality Date  . ANTERIOR FUSION CERVICAL SPINE  2012  . CARDIAC CATHETERIZATION  2011  . CARDIAC CATHETERIZATION N/A 11/09/2016   Procedure: Right Heart Cath;  Surgeon: Belva Crome, MD;  Location: [place] [facility];  Service: Cardiovascular;  Laterality: N/A;  . carpel tunnel     left wrist  . CATARACT EXTRACTION    . CATARACT EXTRACTION W/ INTRAOCULAR LENS  IMPLANT, BILATERAL  2013  . CERVICAL LAMINECTOMY  2012  . COLONOSCOPY N/A 01/14/2013   Procedure: COLONOSCOPY;  Surgeon: Irene Shipper, MD;  Location: [facility];  Service: Endoscopy;   Laterality: N/A;  . EYE SURGERY    . [place]   left  . LEFT HEART CATH AND CORONARY ANGIOGRAPHY N/A 07/10/2020   Procedure: LEFT HEART CATH AND CORONARY ANGIOGRAPHY;  Surgeon: Sherren Mocha, MD;  Location: [place] [facility];  Service: Cardiovascular;  Laterality: N/A;  . LUMBAR LAMINECTOMY  2003  . LUNG BIOPSY Left 12/26/2016   Procedure: LUNG BIOPSY;  Surgeon: [name], MD;  Location: [place];  Service: Thoracic;  Laterality: Left;  . PACEMAKER IMPLANT N/A 03/30/2020   Procedure: PACEMAKER IMPLANT;  Surgeon: Evans Lance, MD;  Location: [place] [facility];  Service: Cardiovascular;  Laterality: N/A;  . POSTERIOR FUSION CERVICAL SPINE  2012  . TRIGGER FINGER RELEASE  2011   4th finger left hand  . VIDEO ASSISTED THORACOSCOPY Left 12/26/2016   Procedure: VIDEO ASSISTED THORACOSCOPY;  Surgeon: [name], MD;  Location: [place];  Service: Thoracic;  Laterality: Left;  Marland Kitchen VIDEO BRONCHOSCOPY N/A 12/26/2016   Procedure: VIDEO BRONCHOSCOPY;  Surgeon: [name], MD;  Location: [facility];  Service: Thoracic;  Laterality: N/A;    Family History  Problem Relation Age of Onset  . Diabetes Mellitus II Mother   . Emphysema Father 41  . Colon cancer Neg Hx   . Esophageal cancer Neg Hx   . Rectal cancer Neg Hx   . Stomach cancer Neg Hx     Social History Social History   Tobacco Use  . Smoking status: Never Smoker  . Smokeless tobacco: Never Used  Vaping Use  . Vaping Use: Never used  Substance Use Topics  . Alcohol use: No  . Drug use: No    Current Outpatient Medications  Medication Sig Dispense Refill  . albuterol (PROAIR HFA) 108 (90 Base) MCG/ACT inhaler Inhale 2 puffs into the lungs every 4 (four) hours as needed for wheezing or shortness of breath.    . Ascorbic Acid (VITAMIN C) 1000 MG tablet Take 1,000 mg by mouth daily.    Marland Kitchen aspirin EC 81 MG tablet Take 81 mg by mouth daily.    . clotrimazole-betamethasone (LOTRISONE) cream Apply 1  application topically daily as needed (for skin infection).   0  . Continuous Blood Gluc Sensor (FREESTYLE LIBRE 14 DAY SENSOR) MISC 1 patch every 14 (fourteen) days.    Marland Kitchen EPINEPHrine 0.3 mg/0.3 mL IJ SOAJ injection Inject 0.3 mg into the muscle once as needed for anaphylaxis.   0  . furosemide (LASIX) 40 MG tablet Take 20 mg by mouth daily.     . insulin detemir (LEVEMIR) 100 UNIT/ML injection Inject 12 Units into the skin at bedtime.     . insulin lispro (HUMALOG) 100 UNIT/ML injection Inject 76 Units into the skin daily. Use as directed with V-Go 40 insulin pump    . ketotifen (ZADITOR) 0.025 % ophthalmic  solution Place 1 drop into both eyes 2 (two) times daily as needed (allergies).     . LORazepam (ATIVAN) 1 MG tablet Take 1 mg by mouth at bedtime as needed for sleep.     Marland Kitchen losartan (COZAAR) 50 MG tablet Take 50 mg by mouth daily.    . magnesium oxide (MAG-OX) 400 MG tablet TAKE 1 CAPSULE BY MOUTH ONCE DAILY 90 tablet 1  . metoprolol succinate (TOPROL-XL) 50 MG 24 hr tablet Take 1 tablet (50 mg total) by mouth daily. Take with or immediately following a meal. 90 tablet 3  . Multiple Vitamin (MULTIVITAMIN WITH MINERALS) TABS tablet Take 1 tablet by mouth daily.    . pantoprazole (PROTONIX) 40 MG tablet Take 40 mg by mouth daily.      . potassium chloride SA (KLOR-CON) 20 MEQ tablet Take 20 mEq by mouth daily.      No current facility-administered medications for this visit.    Allergies  Allergen Reactions  . Codeine Hives and Itching  . Other Diarrhea and Other (See Comments)    Esbriet causes elevated LFTs. D/C on 06/14/17 and SEVERE DIARRHEA  . Ofev [Nintedanib] Diarrhea    SEVERE DIARRHEA    Review of Systems  Constitutional: Positive for fatigue.  HENT: Negative.        Sees a dentist regularly  Eyes: Negative.   Respiratory: Positive for chest tightness and shortness of breath.   Cardiovascular: Positive for chest pain and leg swelling.  Gastrointestinal: Positive for  constipation and diarrhea.  Endocrine: Negative.   Genitourinary: Negative.   Musculoskeletal: Positive for gait problem.  Skin: Negative.   Allergic/Immunologic: Negative.   Neurological: Positive for dizziness.  Psychiatric/Behavioral: Negative.     BP (!) 143/56   Pulse 78   Temp 97.7 F (36.5 C) (Skin)   Resp 20   Ht 6' (1.829 m)   Wt 264 lb (119.7 kg)   SpO2 93% Comment: RA  BMI 35.80 kg/m  Physical Exam Constitutional:      Appearance: Normal appearance. He is obese.  HENT:     Head: Normocephalic and atraumatic.  Eyes:     Extraocular Movements: Extraocular movements intact.     Conjunctiva/sclera: Conjunctivae normal.     Pupils: Pupils are equal, round, and reactive to light.  Neck:     Vascular: No carotid bruit.  Cardiovascular:     Rate and Rhythm: Normal rate and regular rhythm.     Heart sounds: Murmur heard.      Comments: 2/6 Systolic murmur RSB Pulmonary:     Effort: Pulmonary effort is normal.     Comments: Rales in lung bases Musculoskeletal:        General: No swelling.     Cervical back: Normal range of motion and neck supple.     Comments: Wearing compression stockings  Skin:    General: Skin is warm and dry.  Neurological:     General: No focal deficit present.     Mental Status: He is alert and oriented to person, place, and time.  Psychiatric:        Mood and Affect: Mood normal.        Behavior: Behavior normal.      Diagnostic Tests:   ECHOCARDIOGRAM REPORT       Patient Name:  CAROLE DEERE Date of Exam: 03/29/2020  Medical Rec #: [id number]   Height:    72.0 in  Accession #:  [id number]  Weight:    269.0  lb  Date of Birth: January 06, 1941   BSA:     2.417 m  Patient Age:  1 years   BP:      140/64 mmHg  Patient Gender: M       HR:      44 bpm.  Exam Location: Inpatient   Procedure: 2D Echo, Cardiac Doppler and Color Doppler   Indications:  R55 Syncope    History:     Patient has prior history of Echocardiogram examinations,  most         recent 10/16/2019.    Sonographer:  Merrie Roof RDCS  Referring Phys: [id number] [place]    1. Left ventricular ejection fraction, by estimation, is 60 to 65%. The  left ventricle has normal function. The left ventricle has no regional  wall motion abnormalities. There is mild concentric left ventricular  hypertrophy. Diastolic function  assessment is limited by second degree AV block, Mobitz type 1, but  annular velocities are in normal range.  2. Right ventricular systolic function is normal. The right ventricular  size is normal. Tricuspid regurgitation signal is inadequate for assessing  PA pressure.  3. Left atrial size was moderately dilated.  4. The mitral valve is normal in structure. Mild mitral valve  regurgitation.  5. The aortic valve is tricuspid. Aortic valve regurgitation is not  visualized. Mild aortic valve stenosis.  6. Aortic dilatation noted. 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   Comparison(s): No significant change from prior study. Prior images  reviewed side by side.   FINDINGS  Left Ventricle: Left ventricular ejection fraction, by estimation, is 60  to 65%. The left ventricle has normal function. The left ventricle has no  regional wall motion abnormalities. The left ventricular internal cavity  size was normal in size. There is  mild concentric left ventricular hypertrophy. Diastolic function  assessment is limited by second degree AV block, Mobitz type 1, but  annular velocities are in normal range.   Right Ventricle: The right ventricular size is normal. No increase in  right ventricular wall thickness. Right ventricular systolic function is  normal. Tricuspid regurgitation signal is inadequate for assessing PA  pressure.   Left Atrium: Left atrial size was moderately dilated.   Right Atrium: Right atrial  size was normal in size.   Pericardium: There is no evidence of pericardial effusion.   Mitral Valve: The mitral valve is normal in structure. There is mild  thickening of the mitral valve leaflet(s). Mild mitral valve  regurgitation.   Tricuspid Valve: The tricuspid valve is normal in structure. Tricuspid  valve regurgitation is not demonstrated.   Aortic Valve: The aortic valve is tricuspid. . There is moderate  thickening and mild calcification of the aortic valve. Aortic valve  regurgitation is not visualized. Mild aortic stenosis is present. There is  moderate thickening of the aortic valve. There  is mild calcification of the aortic valve. Aortic valve mean gradient  measures 18.0 mmHg. Aortic valve peak gradient measures 30.0 mmHg. Aortic  valve area, by VTI measures 1.23 cm.   Pulmonic Valve: The pulmonic valve was normal in structure. Pulmonic valve  regurgitation is not visualized.   Aorta: Aortic dilatation noted. 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   IAS/Shunts: No atrial level shunt detected by color flow Doppler.     LEFT VENTRICLE  PLAX 2D  LVIDd:     5.00 cm  LVIDs:     3.30 cm  LV PW:     1.30 cm  LV IVS:    1.40 cm  LVOT diam:   2.20 cm  LV SV:     86  LV SV Index:  36  LVOT Area:   3.80 cm    LV Volumes (MOD)  LV vol d, MOD A2C: 133.0 ml  LV vol d, MOD A4C: 98.8 ml  LV vol s, MOD A2C: 30.9 ml  LV vol s, MOD A4C: 14.1 ml  LV SV MOD A2C:   102.1 ml  LV SV MOD A4C:   98.8 ml  LV SV MOD BP:   96.0 ml   RIGHT VENTRICLE       IVC  RV Basal diam: 4.50 cm   IVC diam: 1.80 cm  RV Mid diam:  4.00 cm  RV S prime:   11.60 cm/s  TAPSE (M-mode): 1.8 cm   LEFT ATRIUM       Index    RIGHT ATRIUM      Index  LA diam:    4.80 cm 1.99 cm/m RA Area:   31.40 cm  LA Vol (A2C):  103.0 ml 42.62 ml/m RA Volume:  125.00 ml 51.73 ml/m  LA Vol (A4C):  97.0 ml  40.14 ml/m  LA Biplane Vol: 101.0 ml 41.79 ml/m  AORTIC VALVE  AV Area (Vmax):  1.13 cm  AV Area (Vmean):  1.12 cm  AV Area (VTI):   1.23 cm  AV Vmax:      274.00 cm/s  AV Vmean:     203.000 cm/s  AV VTI:      0.701 m  AV Peak Grad:   30.0 mmHg  AV Mean Grad:   18.0 mmHg  LVOT Vmax:     81.60 cm/s  LVOT Vmean:    60.000 cm/s  LVOT VTI:     0.226 m  LVOT/AV VTI ratio: 0.32    AORTA  Ao Root diam: 3.60 cm  Ao Asc diam: 4.20 cm   MITRAL VALVE  MV Area (PHT): 4.10 cm   SHUNTS  MV Decel Time: 185 msec   Systemic VTI: 0.23 m  MV E velocity: 100.00 cm/s Systemic Diam: 2.20 cm  MV A velocity: 94.30 cm/s  MV E/A ratio: 1.06   Mihai Croitoru MD  Electronically signed by Sanda Klein MD  Signature Date/Time: 03/29/2020/11:52:31 AM     Physicians  Panel Physicians Referring Physician Case Authorizing Physician  Sherren Mocha, MD (Primary)    Procedures  LEFT HEART CATH AND CORONARY ANGIOGRAPHY  Conclusion  1.  Severe three-vessel coronary artery disease with diffuse moderate to severe left main disease, severe stenosis of a large, dominant RCA, severe stenosis of the proximal left circumflex, and moderate diffuse stenosis throughout the LAD with severe stenosis of the apical LAD 2.  Calcified aortic valve with mild aortic stenosis by echo assessment and no significant pullback gradient by cardiac catheterization  The patient has diffuse calcific three-vessel coronary artery disease and diabetes with CCS class III angina.  Recommend cardiac surgical consultation for consideration of CABG.  The aortic valve has significant calcification by fluoroscopy and is moderately difficult to cross with a wire.  Mean gradient by echo is 18 mmHg.  Consideration of concomitant aortic valve replacement if the patient goes for CABG. Indications  Coronary artery disease with exertional angina (HCC) [H73.428 (ICD-10-CM)]  Procedural  Details  Technical Details INDICATION: CAD with exertional angina, CCS Class 3, progressive symptoms  in patient with known moderate CAD on prior cath with multiple CV risk factors including Type II DM.  PROCEDURAL DETAILS: The right wrist is prepped, draped, and anesthetized with 1% lidocaine. Using the modified Seldinger technique, a 5/6 French Slender sheath is introduced into the right radial artery. 3 mg of verapamil is administered through the sheath, weight-based unfractionated heparin was administered intravenously. Standard Judkins catheters are used for selective coronary angiography. LV pressure is recorded and an aortic valve pullback gradient is measured.  The aortic valve is calcified and restricted.  It is crossed with an AL-1 catheter and straight wire.  Catheter exchanges are performed over an exchange length guidewire. There are no immediate procedural complications. A TR band is used for radial hemostasis at the completion of the procedure.  The patient was transferred to the post catheterization recovery area for further monitoring.    Estimated blood loss <50 mL.   During this procedure medications were administered to achieve and maintain moderate conscious sedation while the patient's heart rate, blood pressure, and oxygen saturation were continuously monitored and I was present face-to-face 100% of this time.  Medications (Filter: Administrations occurring from 0801 to 0905 on 07/10/20) (important) Continuous medications are totaled by the amount administered until 07/10/20 0905.  Heparin (Porcine) in NaCl 1000-0.9 UT/500ML-% SOLN (mL) Total volume:  1,000 mL Date/Time  Rate/Dose/Volume Action  07/10/20 0821  500 mL Given  0821  500 mL Given    midazolam (VERSED) injection (mg) Total dose:  1 mg Date/Time  Rate/Dose/Volume Action  07/10/20 0823  1 mg Given    fentaNYL (SUBLIMAZE) injection (mcg) Total dose:  25 mcg Date/Time  Rate/Dose/Volume Action  07/10/20 0823   25 mcg Given    lidocaine (PF) (XYLOCAINE) 1 % injection (mL) Total volume:  2 mL Date/Time  Rate/Dose/Volume Action  07/10/20 0826  2 mL Given    Radial Cocktail/Verapamil only (mL) Total volume:  10 mL Date/Time  Rate/Dose/Volume Action  07/10/20 0828  10 mL Given    heparin sodium (porcine) injection (Units) Total dose:  6,000 Units Date/Time  Rate/Dose/Volume Action  07/10/20 0829  6,000 Units Given    iohexol (OMNIPAQUE) 350 MG/ML injection (mL) Total volume:  90 mL Date/Time  Rate/Dose/Volume Action  07/10/20 0857  90 mL Given    Sedation Time  Sedation Time Physician-1: 33 minutes 29 seconds  Contrast  Medication Name Total Dose  iohexol (OMNIPAQUE) 350 MG/ML injection 90 mL    Radiation/Fluoro  Fluoro time: 9 (min) DAP: 01601 (mGycm2) Cumulative Air Kerma: 093 (mGy)  Coronary Findings  Diagnostic Dominance: Right Left Main  The left main is severely diseased. Proximal half of the left main has 50 to 70% diffuse calcific stenosis. The distal left main is patent.  Ost LM to Dist LM lesion is 60% stenosed. The lesion is severely calcified.  Left Anterior Descending  The LAD is diffusely diseased. The proximal vessel has nonobstructive plaquing. The mid LAD just after the diagonal branch has 70% stenosis. Further down at the junction of the mid and distal LAD there is 70% stenosis in in the apical portion of the LAD there is diffuse disease with tight 90% stenosis.  Prox LAD to Mid LAD lesion is 50% stenosed. The lesion is moderately calcified.  Mid LAD to Dist LAD lesion is 70% stenosed. The lesion is calcified.  Dist LAD lesion is 90% stenosed.  Left Circumflex  The circumflex supplies a single first OM branch. The proximal circumflex near the ostium has  an eccentric calcified 80% stenosis.  Ost Cx to Prox Cx lesion is 80% stenosed. The lesion is moderately calcified.  Right Coronary Artery  The RCA is a large, dominant vessel. There is severe diffuse  sequential stenoses in the mid vessel, more proximal stenosis is 90% and the more distal stenosis is 75%. The PDA and PLA branches are patent.  Prox RCA lesion is 90% stenosed. The lesion is severely calcified.  Mid RCA lesion is 75% stenosed. The lesion is severely calcified. Severe segmental calcific stenosis throughout the mid-RCA. Large caliber, dominant vessel.  Intervention  No interventions have been documented. Left Heart  Aortic Valve There is mild aortic valve stenosis. The aortic valve is calcified. There is restricted aortic valve motion.  Coronary Diagrams  Diagnostic Dominance: Right  Intervention  Implants   No implant documentation for this case.  Syngo Images  Show images for CARDIAC CATHETERIZATION Images on Long Term Storage  Show images for Fynn, [facility] "JIM" Link to Procedure Log  Procedure Log    Hemo Data   Most Recent Value  LV Systolic Pressure 277 mmHg  LV Diastolic Pressure 11 mmHg  LV EDP 25 mmHg  AOp Systolic Pressure 824 mmHg  AOp Diastolic Pressure 63 mmHg  AOp Mean Pressure 94 mmHg  LVp Systolic Pressure 235 mmHg  LVp Diastolic Pressure 9 mmHg  LVp EDP Pressure 17 mmHg    Impression:  This 79 year old gentleman has high-grade long, calcified left main coronary stenosis and severe three-vessel coronary disease as well as moderate aortic stenosis.  He has progressive anginal symptoms with low-level exertion.  His coronary stenoses are not amenable to PCI and I think the best option for him is coronary artery bypass graft surgery and aortic valve replacement.  I think his prognosis without revascularization is poor.  I reviewed the echocardiogram and catheterization images with him and his wife and answered their questions.  I think his operative risk is increased due to his comorbid risk factors including morbid obesity, diabetes, idiopathic pulmonary fibrosis, stage III chronic kidney disease, and recent COVID-19 infection about 3  weeks ago. I discussed the operative procedure with the patient and his wife including alternatives, benefits and risks; including but not limited to bleeding, blood transfusion, infection, stroke, myocardial infarction, graft failure, heart block requiring a permanent pacemaker, organ dysfunction, and death.  He would like to think about it further and discuss it with Dr. [name] before making a decision.  I told him that I would not be able to do surgery until 23 September at the earliest but I would recommend having surgery sooner rather than later.   Plan:  He will call us back if he decides to proceed with coronary artery bypass graft surgery and aortic valve replacement.   I spent 70 minutes performing this consultation and > 50% of this time was spent face to face counseling and coordinating the care of this patient's left main and severe three-vessel coronary disease and moderate aortic stenosis.   Gaye Pollack, MD [facility] [phone]

## 2020-08-19 ENCOUNTER — Other Ambulatory Visit: Payer: Self-pay

## 2020-08-19 ENCOUNTER — Encounter: Payer: Medicare Other | Admitting: Surgery

## 2020-08-19 ENCOUNTER — Encounter: Payer: Self-pay | Admitting: Student

## 2020-08-19 ENCOUNTER — Ambulatory Visit: Payer: Medicare Other | Admitting: Student

## 2020-08-19 VITALS — BP 130/78 | HR 64 | Ht 72.0 in | Wt 265.0 lb

## 2020-08-19 DIAGNOSIS — I10 Essential (primary) hypertension: Secondary | ICD-10-CM | POA: Diagnosis not present

## 2020-08-19 DIAGNOSIS — I209 Angina pectoris, unspecified: Secondary | ICD-10-CM | POA: Diagnosis not present

## 2020-08-19 DIAGNOSIS — I350 Nonrheumatic aortic (valve) stenosis: Secondary | ICD-10-CM | POA: Diagnosis not present

## 2020-08-19 DIAGNOSIS — I459 Conduction disorder, unspecified: Secondary | ICD-10-CM

## 2020-08-19 DIAGNOSIS — Z950 Presence of cardiac pacemaker: Secondary | ICD-10-CM

## 2020-08-19 LAB — CUP PACEART INCLINIC DEVICE CHECK
Date Time Interrogation Session: 20210915140157
Implantable Lead Implant Date: 20210426
Implantable Lead Implant Date: 20210426
Implantable Lead Location: 753859
Implantable Lead Location: 753860
Implantable Lead Model: 377171
Implantable Lead Model: 377171
Implantable Lead Serial Number: 7000191418
Implantable Lead Serial Number: 7000203411
Implantable Pulse Generator Implant Date: 20210426
Lead Channel Impedance Value: 507 Ohm
Lead Channel Impedance Value: 721 Ohm
Lead Channel Pacing Threshold Amplitude: 0.7 V
Lead Channel Pacing Threshold Amplitude: 0.7 V
Lead Channel Pacing Threshold Amplitude: 0.7 V
Lead Channel Pacing Threshold Amplitude: 0.9 V
Lead Channel Pacing Threshold Amplitude: 0.9 V
Lead Channel Pacing Threshold Amplitude: 1 V
Lead Channel Pacing Threshold Pulse Width: 0.4 ms
Lead Channel Pacing Threshold Pulse Width: 0.4 ms
Lead Channel Pacing Threshold Pulse Width: 0.4 ms
Lead Channel Pacing Threshold Pulse Width: 0.4 ms
Lead Channel Pacing Threshold Pulse Width: 0.4 ms
Lead Channel Pacing Threshold Pulse Width: 0.4 ms
Lead Channel Sensing Intrinsic Amplitude: 14.4 mV
Lead Channel Sensing Intrinsic Amplitude: 14.4 mV
Lead Channel Sensing Intrinsic Amplitude: 4 mV
Lead Channel Sensing Intrinsic Amplitude: 4 mV
Lead Channel Setting Pacing Amplitude: 2 V
Lead Channel Setting Pacing Amplitude: 2.4 V
Lead Channel Setting Pacing Pulse Width: 0.4 ms
Pulse Gen Model: 407145
Pulse Gen Serial Number: 69858440

## 2020-08-19 NOTE — Patient Instructions (Addendum)
Medication Instructions:  *If you need a refill on your cardiac medications before your next appointment, please call your pharmacy*  Follow-Up: At [facility], you and your health needs are our priority.  As part of our continuing mission to provide you with exceptional heart care, we have created designated Provider Care Teams.  These Care Teams include your primary Cardiologist (physician) and Advanced Practice Providers (APPs -  Physician Assistants and Nurse Practitioners) who all work together to provide you with the care you need, when you need it.  We recommend signing up for the patient portal called "MyChart".  Sign up information is provided on this After Visit Summary.  MyChart is used to connect with patients for Virtual Visits (Telemedicine).  Patients are able to view lab/test results, encounter notes, upcoming appointments, etc.  Non-urgent messages can be sent to your provider as well.   To learn more about what you can do with MyChart, go to [email or web address].    Your next appointment:   Your physician recommends that you schedule a follow-up appointment in 6 MONTHS with Dr. Lovena Le - Tuesday 02/23/21 at 10:30 am  Remote monitoring is used to monitor your Pacemaker from home. This monitoring reduces the number of office visits required to check your device to one time per year. It allows [place] to keep an eye on the functioning of your device to ensure it is working properly. You are scheduled for a device check from home on 09/28/20. You may send your transmission at any time that day. If you have a wireless device, the transmission will be sent automatically. After your physician reviews your transmission, you will receive a postcard with your next transmission date.  The format for your next appointment:   In Person with Cristopher Peru, MD

## 2020-08-19 NOTE — Progress Notes (Signed)
Electrophysiology Office Note Date: 08/19/2020  ID:  Shane [name] August 31, 1941, MRN [id number]  PCP: Burnard Bunting, MD Primary Cardiologist: Kirk Ruths, MD Electrophysiologist: Cristopher Peru, MD   CC: Pacemaker follow-up  Shane [name] is a 79 y.o. male seen today for Cristopher Peru, MD for routine electrophysiology followup.  Since last being seen in our clinic the patient reports doing about the same. He continues to have exertional chest pain with mild exertion. He has been seen by Dr. Cyndia Bent and has been recommended for CABG and AVR. He is currently considering it, and has not yet been scheduled. He denies palpitations, PND, orthopnea, nausea, vomiting, dizziness, syncope, edema, weight gain, or early satiety.  LHC 07/10/2020  1.  Severe three-vessel coronary artery disease with diffuse moderate to severe left main disease, severe stenosis of a large, dominant RCA, severe stenosis of the proximal left circumflex, and moderate diffuse stenosis throughout the LAD with severe stenosis of the apical LAD 2.  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  Past Medical History:  Diagnosis Date  . 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 Cataract   . Chronic kidney disease    d/t DM  . CKD (chronic kidney disease), stage III   . Diabetes mellitus    Vgo disposal insulin bolus  simular to insulin pump  . Dyspnea   . GERD (gastroesophageal reflux disease)   . Hyperlipidemia   . Hypertension   . 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 ILD (interstitial lung disease) ([place])   . Moderate aortic stenosis    a. 10/2019 Echo: EF 55-60%, Gr2 DD. Nl RV.   Marland Kitchen Neuromuscular disorder ([place])   . Nonobstructive CAD (coronary artery disease)    a. 2012 Cath: mod, nonobs dzs; b. 10/2016 MV: EF 60%, no ischemia.  Marland Kitchen  PONV (postoperative nausea and vomiting)   . Sleep apnea     uses cpap asked to bring mask and tubing   Past Surgical History:  Procedure Laterality Date  . ANTERIOR FUSION CERVICAL SPINE  2012  . CARDIAC CATHETERIZATION  2011  . CARDIAC CATHETERIZATION N/A 11/09/2016   Procedure: Right Heart Cath;  Surgeon: Belva Crome, MD;  Location: [facility];  Service: Cardiovascular;  Laterality: N/A;  . carpel tunnel     left wrist  . CATARACT EXTRACTION    . CATARACT EXTRACTION W/ INTRAOCULAR LENS  IMPLANT, BILATERAL  2013  . CERVICAL LAMINECTOMY  2012  . COLONOSCOPY N/A 01/14/2013   Procedure: COLONOSCOPY;  Surgeon: Irene Shipper, MD;  Location: [facility];  Service: Endoscopy;  Laterality: N/A;  . EYE SURGERY    . [place]   left  . LEFT HEART CATH AND CORONARY ANGIOGRAPHY N/A 07/10/2020   Procedure: LEFT HEART CATH AND CORONARY ANGIOGRAPHY;  Surgeon: Sherren Mocha, MD;  Location: [facility];  Service: Cardiovascular;  Laterality: N/A;  . LUMBAR LAMINECTOMY  2003  . LUNG BIOPSY Left 12/26/2016   Procedure: LUNG BIOPSY;  Surgeon: [name], MD;  Location: [facility];  Service: Thoracic;  Laterality: Left;  . PACEMAKER IMPLANT N/A 03/30/2020   Procedure: PACEMAKER IMPLANT;  Surgeon: Evans Lance, MD;  Location: [facility];  Service: Cardiovascular;  Laterality: N/A;  . POSTERIOR FUSION CERVICAL SPINE  2012  . TRIGGER FINGER RELEASE  2011   4th finger left hand  . VIDEO ASSISTED THORACOSCOPY Left 12/26/2016   Procedure: VIDEO ASSISTED THORACOSCOPY;  Surgeon: [name], MD;  Location: [facility];  Service: Thoracic;  Laterality: Left;  Marland Kitchen VIDEO BRONCHOSCOPY N/A 12/26/2016   Procedure: VIDEO BRONCHOSCOPY;  Surgeon: [name], MD;  Location: [facility];  Service: Thoracic;  Laterality: N/A;    Current Outpatient Medications  Medication Sig Dispense Refill  . albuterol (PROAIR HFA) 108 (90 Base) MCG/ACT inhaler Inhale 2 puffs into the lungs  every 4 (four) hours as needed for wheezing or shortness of breath.    . Ascorbic Acid (VITAMIN C) 1000 MG tablet Take 1,000 mg by mouth daily.    Marland Kitchen aspirin EC 81 MG tablet Take 81 mg by mouth daily.    . clotrimazole-betamethasone (LOTRISONE) cream Apply 1 application topically daily as needed (for skin infection).   0  . Continuous Blood Gluc Sensor (FREESTYLE LIBRE 14 DAY SENSOR) MISC 1 patch every 14 (fourteen) days.    Marland Kitchen EPINEPHrine 0.3 mg/0.3 mL IJ SOAJ injection Inject 0.3 mg into the muscle once as needed for anaphylaxis.   0  . furosemide (LASIX) 40 MG tablet Take 20 mg by mouth daily.     . insulin detemir (LEVEMIR) 100 UNIT/ML injection Inject 12 Units into the skin at bedtime.     . insulin lispro (HUMALOG) 100 UNIT/ML injection Inject 76 Units into the skin daily. Use as directed with V-Go 40 insulin pump    . ketotifen (ZADITOR) 0.025 % ophthalmic solution Place 1 drop into both eyes 2 (two) times daily as needed (allergies).     . LORazepam (ATIVAN) 1 MG tablet Take 1 mg by mouth at bedtime as needed for sleep.     Marland Kitchen losartan (COZAAR) 50 MG tablet Take 50 mg by mouth daily.    . magnesium oxide (MAG-OX) 400 MG tablet TAKE 1 CAPSULE BY MOUTH ONCE DAILY 90 tablet 1  . metoprolol succinate (TOPROL-XL) 50 MG 24 hr tablet Take 1 tablet (50 mg total) by mouth daily. Take with or immediately following a meal. 90 tablet 3  . Multiple Vitamin (MULTIVITAMIN WITH MINERALS) TABS tablet Take 1 tablet by mouth daily.    . pantoprazole (PROTONIX) 40 MG tablet Take 40 mg by mouth daily.      . potassium chloride SA (KLOR-CON) 20 MEQ tablet Take 20 mEq by mouth daily.      No current facility-administered medications for this visit.    Allergies:   Codeine, Other, and Ofev [nintedanib]   Social History: Social History   Socioeconomic History  . Marital status: Married    Spouse name: Not on file  . Number of children: Not on file  . Years of education: Not on file  . Highest education  level: Not on file  Occupational History  . Not on file  Tobacco Use  . Smoking status: Never Smoker  . Smokeless tobacco: Never Used  Vaping Use  . Vaping Use: Never used  Substance and Sexual Activity  . Alcohol use: No  . Drug use: No  . Sexual activity: Not Currently  Other Topics Concern  . 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    Social Determinants of Health   Financial Resource Strain:   . Difficulty of Paying Living Expenses: Not on file  Food Insecurity:   . Worried About Charity fundraiser in the Last Year: Not on file  .  Ran Out of Food in the Last Year: Not on file  Transportation Needs:   . Lack of Transportation (Medical): Not on file  . Lack of Transportation (Non-Medical): Not on file  Physical Activity:   . Days of Exercise per Week: Not on file  . Minutes of Exercise per Session: Not on file  Stress:   . Feeling of Stress : Not on file  Social Connections:   . Frequency of Communication with Friends and Family: Not on file  . Frequency of Social Gatherings with Friends and Family: Not on file  . Attends Religious Services: Not on file  . Active Member of Clubs or Organizations: Not on file  . Attends Archivist Meetings: Not on file  . Marital Status: Not on file  Intimate Partner Violence:   . Fear of Current or Ex-Partner: Not on file  . Emotionally Abused: Not on file  . Physically Abused: Not on file  . Sexually Abused: Not on file    Family History: Family History  Problem Relation Age of Onset  . Diabetes Mellitus II Mother   . Emphysema Father 29  . Colon cancer Neg Hx   . Esophageal cancer Neg Hx   . Rectal cancer Neg Hx   . Stomach cancer Neg Hx      Review of Systems: All other systems reviewed and are otherwise negative except as noted above.  Physical Exam: Vitals:   08/19/20 1043  BP: 130/78  Pulse: 64  SpO2: 97%  Weight: 265 lb (120.2 kg)  Height: 6' (1.829 m)     GEN-  The patient is well appearing, alert and oriented x 3 today.   HEENT: normocephalic, atraumatic; sclera clear, conjunctiva pink; hearing intact; oropharynx clear; neck supple  Lungs- Clear to ausculation bilaterally, normal work of breathing.  No wheezes, rales, rhonchi Heart- Regular rate and rhythm, no murmurs, rubs or gallops  GI- soft, non-tender, non-distended, bowel sounds present  Extremities- no clubbing or cyanosis. No edema MS- no significant deformity or atrophy Skin- warm and dry, no rash or lesion; PPM pocket well healed Psych- euthymic mood, full affect Neuro- strength and sensation are intact  PPM Interrogation- reviewed in detail today,  See PACEART report  EKG:  EKG is not ordered today.  Recent Labs: 03/28/2020: TSH 3.258 03/31/2020: Magnesium 1.6 07/17/2020: ALT 21; B Natriuretic Peptide 210.9; BUN 23; Creatinine, Ser 1.45; Hemoglobin 14.0; Platelets 193; Potassium 4.9; Sodium 131   Wt Readings from Last 3 Encounters:  08/19/20 265 lb (120.2 kg)  08/13/20 264 lb (119.7 kg)  08/03/20 264 lb (119.7 kg)     Other studies Reviewed: Additional studies/ records that were reviewed today include: Previous EP office notes, Previous remote checks, Most recent labwork.   Assessment and Plan:  1. Advanced AV block s/p Biotronik PPM  Normal PPM function See Pace Art report No changes today  2. Severe 3v CAD with stable angina He has been recommended for CABG and is considering. He thinks he is going to move forward with it.   Encouraged him today that the longer he waits the more risk he puts upon himself of having a significant cardiac event. He voices understanding. He is going to speak with Dr. [name] and then plans on scheduling CABG and AVR with Dr. Cyndia Bent.   3. Syncope None since PPM placed.  Current medicines are reviewed at length with the patient today.   The patient does not have concerns regarding his medicines.  The following  changes were made today:  none   Labs/ tests ordered today include:  Orders Placed This Encounter  Procedures  . CUP PACEART INCLINIC DEVICE CHECK    Disposition:   Follow up with Dr. Lovena Le in 6 Months. Encouraged to proceed with CABG as soon as possible.   Jacalyn Lefevre, PA-C  08/19/2020 2:15 PM  [address] [place] [address] [phone] (office) [phone] (fax)

## 2020-08-24 ENCOUNTER — Other Ambulatory Visit: Payer: Self-pay

## 2020-08-24 ENCOUNTER — Ambulatory Visit (HOSPITAL_COMMUNITY)
Admission: RE | Admit: 2020-08-24 | Discharge: 2020-08-24 | Disposition: A | Discharge status: Home / Self Care | Payer: Medicare Other | Source: Ambulatory Visit | Attending: Internal Medicine | Admitting: Internal Medicine

## 2020-08-24 DIAGNOSIS — Z006 Encounter for examination for normal comparison and control in clinical research program: Secondary | ICD-10-CM

## 2020-08-24 DIAGNOSIS — J84112 Idiopathic pulmonary fibrosis: Secondary | ICD-10-CM

## 2020-08-24 MED ORDER — STUDY - FIBROGEN - PAMREVLUMAB 10 MG/ML IV INFUSION (OPEN LABEL) (PI-RAMASWAMY)
30.0000 mg/kg | Freq: Once | INTRAVENOUS | Status: AC
  Administered 2020-08-24: 3670 mg via INTRAVENOUS
  Filled 2020-08-24: qty 367

## 2020-08-24 NOTE — Research (Signed)
Title: [id number] (FibroGen Study) is a Phase 3, randomized, double-blind, placebo-controlled multicenter international study to evaluate evaluate the efficacy and safety of 30 mg/kg IV infusions of pamrevlumab administered every 3 weeks for 52 weeks as compared to placebo in subjects with Idiopathic Pulmonary Fibrosis. Primary end point is: change in FVC from baseline at week [place] / Research RN note : This visit for Subject Shane [name] Un with DOB: 09/11/1941 on 08/24/2020 for the above protocol is Visit/Encounter #EX Week 42  and is for purpose of research. The consent for this encounter is under Protocol Version Amendment 5 and is currently IRB approved. Subject expressed continued interest and consent in continuing as a study subject. Subject confirmed that there was no change in contact information (e.g. address, telephone, email). Subject thanked for participation in research and contribution to science.   In this visit 08/24/2020 the subject is not required to be evaluated by the investigator.  All study visit EX Week 42 procedures and assessments were performed per the above mentioned protocol. AEs and Conmeds were reviewed at this time. Subject confirmed there were no changes. Per subject, decision regarding the recommended bypass surgery has not yet been made. Subject stated he wished to obtain a second opinion from [facility] cardiology. Study drug was infused without problems.  Please refer to the subjects paper source binder for further details of the visit. The subject will return in approximately 3 weeks for his next infusion.   Signed by [place] Assistant [facility]  [place], [place] 11:55 AM 08/24/2020

## 2020-08-24 NOTE — Addendum Note (Signed)
Encounter addended by: Lilli Few, CMA on: 08/24/2020 1:48 PM  Actions taken: Clinical Note Signed

## 2020-08-24 NOTE — Progress Notes (Signed)
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[place])  ADL [facility]:  Pulmonary Assessment Scores    Row Name 06/10/20 0947 06/10/20 1034       ADL [facility]   ADL Phase Entry --    SOB Score total 79 --      CAT Score   CAT Score 17 --      mMRC Score   mMRC Score -- 4           Initial Exercise Prescription:  Initial Exercise Prescription - 06/10/20 1000      Date of Initial Exercise RX and Referring Provider   Date 06/10/20    Referring Provider Brand Males (P)     Expected Discharge Date 08/06/20 (P)       NuStep   Level 1    SPM 60    Minutes 25    METs 1.5      Prescription Details   Frequency (times per week) 2    Duration Progress to 30 minutes of continuous aerobic without signs/symptoms of physical distress      Intensity   THRR 40-80% of Max Heartrate 56-113    Ratings of Perceived Exertion 11-13    Perceived Dyspnea 0-4      Progression   Progression Continue progressive overload as per policy without signs/symptoms or physical distress.      Resistance Training   Training Prescription Yes    Weight Resistance Band    Reps 10-15           Discharge Exercise Prescription (Final Exercise Prescription Changes):  Exercise Prescription Changes - 06/30/20 1200      Response to Exercise   Blood Pressure (Admit) 142/78    Blood Pressure (Exercise) 146/82    Blood Pressure (Exit) 128/70    Heart Rate (Admit) 80 bpm    Heart Rate (Exercise) 91 bpm    Heart Rate (Exit) 80 bpm    Oxygen  Saturation (Admit) 94 %    Oxygen Saturation (Exercise) 96 %    Oxygen Saturation (Exit) 97 %    Rating of Perceived Exertion (Exercise) 11    Perceived Dyspnea (Exercise) 1    Duration Continue with 30 min of aerobic exercise without signs/symptoms of physical distress.    Intensity THRR unchanged      Progression   Progression Continue to progress workloads to maintain intensity without signs/symptoms of physical distress.    Average METs 1.8      Resistance Training   Training Prescription Yes    Weight Blue bands    Reps 10-15    Time 10 Minutes      NuStep   Level 2    SPM 80    Minutes 30    METs 1.8           Functional Capacity:  6 Minute Walk    Row Name 06/10/20 1036         6 Minute Walk   Phase Initial     Distance  555 feet     Walk Time 6 minutes     # of Rest Breaks 2     MPH 1.05     METS 0.71     RPE 17     Perceived Dyspnea  3     VO2 Peak 2.48     Symptoms Yes (comment)     Comments Used whelchair. Pt had lightheadedness at 2:20. Pt sat for 1:10 then resumed. 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 SOB, RPD = 3     Resting HR 59 bpm     Resting BP 110/64     Resting Oxygen Saturation  97 %     Exercise Oxygen Saturation  during 6 min walk 91 %     Max Ex. HR 102 bpm     Max Ex. BP 140/76     2 Minute Post BP 124/72       Interval HR   1 Minute HR 75     2 Minute HR 95     3 Minute HR 97     4 Minute HR 91     5 Minute HR 99     6 Minute HR 102     2 Minute Post HR 73     Interval Heart Rate? Yes       Interval Oxygen   Interval Oxygen? Yes     Baseline Oxygen Saturation % 97 %     1 Minute Oxygen Saturation % 94 %     1 Minute Liters of Oxygen 0 L     2 Minute Oxygen Saturation % 91 %     2 Minute Liters of Oxygen 0 L     3 Minute Oxygen Saturation % 95 %     3 Minute Liters of Oxygen 0 L     4 Minute Oxygen Saturation % 95 %     4 Minute Liters of Oxygen 0 L      5 Minute Oxygen Saturation % 92 %     5 Minute Liters of Oxygen 0 L     6 Minute Oxygen Saturation % 91 %     6 Minute Liters of Oxygen 0 L     2 Minute Post Oxygen Saturation % 96 %     2 Minute Post Liters of Oxygen 0 L            Psychological, QOL, Others - Outcomes: PHQ 2/9: Depression screen PHQ 2/9 06/10/2020  Decreased Interest 0  Down, Depressed, Hopeless 0  PHQ - 2 Score 0  Altered sleeping 0  Tired, decreased energy 0  Change in appetite 0  Feeling bad or failure about yourself  0  Trouble concentrating 0  Moving slowly or fidgety/restless 0  Suicidal thoughts 0  Difficult doing work/chores Not difficult at all  Some recent data might be hidden    Quality of Life:   Personal Goals: Goals established at orientation with interventions provided to work toward goal.  Personal Goals and Risk Factors at Admission - 06/10/20 0950      Core Components/Risk Factors/Patient Goals on Admission   Improve shortness of breath with ADL's Yes    Intervention Provide education, individualized exercise plan and daily activity instruction to help decrease symptoms of SOB with activities of daily living.    Expected Outcomes Short Term: Improve cardiorespiratory fitness to achieve a reduction of symptoms when  performing ADLs;Long Term: Be able to perform more ADLs without symptoms or delay the onset of symptoms            Personal Goals Discharge:  Goals and Risk Factor Review    Row Name 06/10/20 0950 06/16/20 1158 07/15/20 1200 08/24/20 1558       Core Components/Risk Factors/Patient Goals Review   Personal Goals Review Improve shortness of breath with ADL's;Develop more efficient breathing techniques such as purse lipped breathing and diaphragmatic breathing and practicing self-pacing with activity.;Increase knowledge of respiratory medications and ability to use respiratory devices properly. Increase knowledge of respiratory medications and ability to use respiratory  devices properly.;Improve shortness of breath with ADL's;Develop more efficient breathing techniques such as purse lipped breathing and diaphragmatic breathing and practicing self-pacing with activity. Increase knowledge of respiratory medications and ability to use respiratory devices properly.;Improve shortness of breath with ADL's;Develop more efficient breathing techniques such as purse lipped breathing and diaphragmatic breathing and practicing self-pacing with activity.;Diabetes Increase knowledge of respiratory medications and ability to use respiratory devices properly.;Improve shortness of breath with ADL's;Develop more efficient breathing techniques such as purse lipped breathing and diaphragmatic breathing and practicing self-pacing with activity.;Diabetes    Review -- Today was his first day of exercise, he did well, too early to have met any goals. Pt has completed 4 exercise sessions and is presently on hold pending upcoming consult with the heart surgeon in response to cath report.  Based upon reported blood glucose reading pre and post exercise, pt demonstrates adequate management of his diabetes.   Pt is very familiar with utilizing efficent breathing techniques such as PLB and diaphragmatic breathing while pacing himself during activity. Pt completed 4 exercise sessions with mutal decision to discharge due to the need to have CABG/AVR    Expected Outcomes -- See admission goals. See admission goals. Unable to determine due to short period of participation.  no post assessments were completed           Exercise Goals and Review:  Exercise Goals    Row Name 06/10/20 1046 06/16/20 1201 07/14/20 0755         Exercise Goals   Increase Physical Activity Yes Yes Yes     Intervention Provide advice, education, support and counseling about physical activity/exercise needs.;Develop an individualized exercise prescription for aerobic and resistive training based on initial evaluation findings,  risk stratification, comorbidities and participant's personal goals. Provide advice, education, support and counseling about physical activity/exercise needs.;Develop an individualized exercise prescription for aerobic and resistive training based on initial evaluation findings, risk stratification, comorbidities and participant's personal goals. Provide advice, education, support and counseling about physical activity/exercise needs.;Develop an individualized exercise prescription for aerobic and resistive training based on initial evaluation findings, risk stratification, comorbidities and participant's personal goals.     Expected Outcomes Short Term: Attend rehab on a regular basis to increase amount of physical activity.;Long Term: Add in home exercise to make exercise part of routine and to increase amount of physical activity.;Long Term: Exercising regularly at least 3-5 days a week. Short Term: Attend rehab on a regular basis to increase amount of physical activity.;Long Term: Add in home exercise to make exercise part of routine and to increase amount of physical activity.;Long Term: Exercising regularly at least 3-5 days a week. Short Term: Attend rehab on a regular basis to increase amount of physical activity.;Long Term: Add in home exercise to make exercise part of routine and to increase amount of physical activity.;Long Term: Exercising regularly  at least 3-5 days a week.     Increase Strength and Stamina Yes Yes Yes     Intervention Provide advice, education, support and counseling about physical activity/exercise needs.;Develop an individualized exercise prescription for aerobic and resistive training based on initial evaluation findings, risk stratification, comorbidities and participant's personal goals. Provide advice, education, support and counseling about physical activity/exercise needs.;Develop an individualized exercise prescription for aerobic and resistive training based on initial  evaluation findings, risk stratification, comorbidities and participant's personal goals. Provide advice, education, support and counseling about physical activity/exercise needs.;Develop an individualized exercise prescription for aerobic and resistive training based on initial evaluation findings, risk stratification, comorbidities and participant's personal goals.     Expected Outcomes Short Term: Increase workloads from initial exercise prescription for resistance, speed, and METs.;Short Term: Perform resistance training exercises routinely during rehab and add in resistance training at home;Long Term: Improve cardiorespiratory fitness, muscular endurance and strength as measured by increased METs and functional capacity (6MWT) Short Term: Increase workloads from initial exercise prescription for resistance, speed, and METs.;Short Term: Perform resistance training exercises routinely during rehab and add in resistance training at home;Long Term: Improve cardiorespiratory fitness, muscular endurance and strength as measured by increased METs and functional capacity (6MWT) Short Term: Increase workloads from initial exercise prescription for resistance, speed, and METs.;Short Term: Perform resistance training exercises routinely during rehab and add in resistance training at home;Long Term: Improve cardiorespiratory fitness, muscular endurance and strength as measured by increased METs and functional capacity (6MWT)     Able to understand and use rate of perceived exertion (RPE) scale Yes Yes Yes     Intervention Provide education and explanation on how to use RPE scale Provide education and explanation on how to use RPE scale Provide education and explanation on how to use RPE scale     Expected Outcomes Short Term: Able to use RPE daily in rehab to express subjective intensity level;Long Term:  Able to use RPE to guide intensity level when exercising independently Short Term: Able to use RPE daily in rehab  to express subjective intensity level;Long Term:  Able to use RPE to guide intensity level when exercising independently Short Term: Able to use RPE daily in rehab to express subjective intensity level;Long Term:  Able to use RPE to guide intensity level when exercising independently     Able to understand and use Dyspnea scale Yes Yes Yes     Intervention Provide education and explanation on how to use Dyspnea scale Provide education and explanation on how to use Dyspnea scale Provide education and explanation on how to use Dyspnea scale     Expected Outcomes Short Term: Able to use Dyspnea scale daily in rehab to express subjective sense of shortness of breath during exertion;Long Term: Able to use Dyspnea scale to guide intensity level when exercising independently Short Term: Able to use Dyspnea scale daily in rehab to express subjective sense of shortness of breath during exertion;Long Term: Able to use Dyspnea scale to guide intensity level when exercising independently Short Term: Able to use Dyspnea scale daily in rehab to express subjective sense of shortness of breath during exertion;Long Term: Able to use Dyspnea scale to guide intensity level when exercising independently     Knowledge and understanding of Target Heart Rate Range (THRR) Yes Yes Yes     Intervention Provide education and explanation of THRR including how the numbers were predicted and where they are located for reference Provide education and explanation of THRR including how  the numbers were predicted and where they are located for reference Provide education and explanation of THRR including how the numbers were predicted and where they are located for reference     Expected Outcomes Short Term: Able to state/look up THRR;Long Term: Able to use THRR to govern intensity when exercising independently;Short Term: Able to use daily as guideline for intensity in rehab Short Term: Able to state/look up THRR;Long Term: Able to use THRR  to govern intensity when exercising independently;Short Term: Able to use daily as guideline for intensity in rehab Short Term: Able to state/look up THRR;Long Term: Able to use THRR to govern intensity when exercising independently;Short Term: Able to use daily as guideline for intensity in rehab     Understanding of Exercise Prescription Yes Yes Yes     Intervention Provide education, explanation, and written materials on patient's individual exercise prescription Provide education, explanation, and written materials on patient's individual exercise prescription Provide education, explanation, and written materials on patient's individual exercise prescription     Expected Outcomes Short Term: Able to explain program exercise prescription;Long Term: Able to explain home exercise prescription to exercise independently Short Term: Able to explain program exercise prescription;Long Term: Able to explain home exercise prescription to exercise independently Short Term: Able to explain program exercise prescription;Long Term: Able to explain home exercise prescription to exercise independently            Exercise Goals Re-Evaluation:  Exercise Goals Re-Evaluation    Row Name 06/16/20 1201 07/14/20 0756           Exercise Goal Re-Evaluation   Exercise Goals Review Increase Physical Activity;Increase Strength and Stamina;Able to understand and use rate of perceived exertion (RPE) scale;Able to understand and use Dyspnea scale;Knowledge and understanding of Target Heart Rate Range (THRR);Understanding of Exercise Prescription Increase Physical Activity;Increase Strength and Stamina;Able to understand and use rate of perceived exertion (RPE) scale;Able to understand and use Dyspnea scale;Knowledge and understanding of Target Heart Rate Range (THRR);Understanding of Exercise Prescription      Comments Pt has completed 1 exercise session. He exercised at 1.8 METs on the stepper. Will monitor and progress as  able. Pt has attended 4 exercise sessions. He is currently on hold and has been advised not to come to pulmonary rehab. 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 He will have a consultation with a cardiothoracic surgeon next week to determine his course of treatment. Pt will likely be discharged from the program.      Expected Outcomes Through exercise at rehab and at home patient will be able to increase physical activity, strength, and stamina. Through exercise at rehab and at home, the patient will decrease shortness of breath with daily activities and feel confident in carrying out an exercise regime at home.             Nutrition & Weight - Outcomes:  Pre Biometrics - 06/10/20 0945      Pre Biometrics   Grip Strength 30 kg            Nutrition:   Nutrition Discharge:   Education Questionnaire Score:  Knowledge Questionnaire Score - 06/10/20 0957      Knowledge Questionnaire Score   Pre Score 15/18           Goals reviewed with patient. Cherre Huger, BSN Cardiac and Training and development officer

## 2020-08-24 NOTE — Addendum Note (Signed)
Encounter addended by: Rowe Pavy, RN on: 08/24/2020 4:03 PM  Actions taken: Episode resolved, Flowsheet data copied forward, Flowsheet accepted, Clinical Note Signed

## 2020-09-02 DIAGNOSIS — I2510 Atherosclerotic heart disease of native coronary artery without angina pectoris: Secondary | ICD-10-CM | POA: Insufficient documentation

## 2020-09-09 ENCOUNTER — Telehealth (INDEPENDENT_AMBULATORY_CARE_PROVIDER_SITE_OTHER): Payer: Medicare Other | Admitting: Cardiology

## 2020-09-09 ENCOUNTER — Encounter: Payer: Self-pay | Admitting: Cardiology

## 2020-09-09 VITALS — BP 140/68 | HR 67 | Ht 72.0 in | Wt 264.0 lb

## 2020-09-09 DIAGNOSIS — N1831 Chronic kidney disease, stage 3a: Secondary | ICD-10-CM | POA: Diagnosis not present

## 2020-09-09 DIAGNOSIS — I10 Essential (primary) hypertension: Secondary | ICD-10-CM

## 2020-09-09 DIAGNOSIS — Z950 Presence of cardiac pacemaker: Secondary | ICD-10-CM

## 2020-09-09 DIAGNOSIS — I25118 Atherosclerotic heart disease of native coronary artery with other forms of angina pectoris: Secondary | ICD-10-CM

## 2020-09-09 DIAGNOSIS — I350 Nonrheumatic aortic (valve) stenosis: Secondary | ICD-10-CM

## 2020-09-09 DIAGNOSIS — Z959 Presence of cardiac and vascular implant and graft, unspecified: Secondary | ICD-10-CM | POA: Insufficient documentation

## 2020-09-09 DIAGNOSIS — Z9641 Presence of insulin pump (external) (internal): Secondary | ICD-10-CM

## 2020-09-09 DIAGNOSIS — J84112 Idiopathic pulmonary fibrosis: Secondary | ICD-10-CM

## 2020-09-09 DIAGNOSIS — E118 Type 2 diabetes mellitus with unspecified complications: Secondary | ICD-10-CM

## 2020-09-09 MED ORDER — ISOSORBIDE MONONITRATE ER 30 MG PO TB24
30.0000 mg | ORAL_TABLET | Freq: Every day | ORAL | 3 refills | Status: DC
Start: 2020-09-09 — End: 2020-12-08

## 2020-09-09 NOTE — Patient Instructions (Signed)
Medication Instructions:  START- Isosorbide 30 mg, take 15 mg(1/2 tablet) by mouth for 3 days then 30 mg by mouth daily  *If you need a refill on your cardiac medications before your next appointment, please call your pharmacy*   Lab Work: None Ordered  Testing/Procedures: None Ordered   Follow-Up: At [facility], you and your health needs are our priority.  As part of our continuing mission to provide you with exceptional heart care, we have created designated Provider Care Teams.  These Care Teams include your primary Cardiologist (physician) and Advanced Practice Providers (APPs -  Physician Assistants and Nurse Practitioners) who all work together to provide you with the care you need, when you need it.  We recommend signing up for the patient portal called "MyChart".  Sign up information is provided on this After Visit Summary.  MyChart is used to connect with patients for Virtual Visits (Telemedicine).  Patients are able to view lab/test results, encounter notes, upcoming appointments, etc.  Non-urgent messages can be sent to your provider as well.   To learn more about what you can do with MyChart, go to [email or web address].    Your next appointment:   Monday January 17th @ 3:00 pm  The format for your next appointment:   In Person  Provider:   You may see Kirk Ruths, MD or one of the following Advanced Practice Providers on your designated Care Team:    Kerin Ransom, PA-C  [place], [place]  Coletta Memos, [place]

## 2020-09-09 NOTE — Progress Notes (Signed)
Virtual Visit via Telephone Note   This visit type was conducted due to national recommendations for restrictions regarding the COVID-19 Pandemic (e.g. social distancing) in an effort to limit this patient's exposure and mitigate transmission in our community.  Due to his co-morbid illnesses, this patient is at least at moderate risk for complications without adequate follow up.  This format is felt to be most appropriate for this patient at this time.  The patient did not have access to video technology/had technical difficulties with video requiring transitioning to audio format only (telephone).  All issues noted in this document were discussed and addressed.  No physical exam could be performed with this format.  Please refer to the patient's chart for his  consent to telehealth for [facility].    Date:  09/09/2020   ID:  Shane [name], DOB 29-Nov-1941, MRN [id number] The patient was identified using 2 identifiers.  Patient Location: Home Provider Location: Home Office  PCP:  Burnard Bunting, MD  Cardiologist:  Kirk Ruths, MD  Electrophysiologist:  Cristopher Peru, MD   Evaluation Performed:  Follow-Up Visit  Chief Complaint:  Exertional chest pain  History of Present Illness:    Shane [name] is a 79 y.o. male with a history of hypertension, hyperlipidemia, idiopathic pulmonary fibrosis followed by Dr. 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  Patient did have a catheterization in the past that showed moderate coronary disease.  The summer he developed increasing exertional angina.  Catheterization was done in August 2021 that showed 60% left main, 50% proximal LAD, 70% mid LAD, 90% apical LAD, 80% ostial circumflex, and a 90% proximal RCA.  He does have calcific aortic stenosis that appears to be moderate.  He also contracted  CO VID in August and was hospitalized for a few days at [facility].   The patient was seen by Dr. Cyndia Bent on 08/13/2020.  He has recommended CABG and AVR.  He feels the patient is at increased risk but that his long-term outlook without revascularization is poor. The patient is considering his options.  Dr. [name] has referred him to [facility] for second opinion.  The patient was contacted by phone today for a scheduled follow-up.  His wife was present during the phone call.  The patient tells me that he continues to have exertional chest pain relieved with rest.  He has not had to use nitroglycerin.  His symptoms have increased in frequency.  He now has chest discomfort almost daily with minimal exertion.    Past Medical History:  Diagnosis Date  . 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 Cataract   . Chronic kidney disease    d/t DM  . CKD (chronic kidney disease), stage III ([place])   . Diabetes mellitus    Vgo disposal insulin bolus  simular to insulin pump  . Dyspnea   . GERD (gastroesophageal reflux disease)   . Hyperlipidemia   . Hypertension   . Idiopathic pulmonary fibrosis ([place]) 11/2016  . ILD (interstitial lung disease) ([place])   . Moderate aortic stenosis    a. 10/2019 Echo: EF 55-60%, Gr2 DD. Nl RV.   Marland Kitchen Neuromuscular disorder ([place])   . Nonobstructive CAD (coronary artery disease)    a. 2012 Cath: mod, nonobs dzs; b. 10/2016 MV: EF 60%, no ischemia.  Marland Kitchen PONV (postoperative nausea and vomiting)   . Sleep apnea  uses cpap asked to bring mask and tubing   Past Surgical History:  Procedure Laterality Date  . ANTERIOR FUSION CERVICAL SPINE  2012  . CARDIAC CATHETERIZATION  2011  . CARDIAC CATHETERIZATION N/A 11/09/2016   Procedure: Right Heart Cath;  Surgeon: Belva Crome, MD;  Location: [place] CV [facility];  Service: Cardiovascular;  Laterality: N/A;  . carpel tunnel     left wrist  . CATARACT EXTRACTION    . CATARACT EXTRACTION W/ INTRAOCULAR LENS  IMPLANT, BILATERAL  2013  . CERVICAL  LAMINECTOMY  2012  . COLONOSCOPY N/A 01/14/2013   Procedure: COLONOSCOPY;  Surgeon: Irene Shipper, MD;  Location: [facility];  Service: Endoscopy;  Laterality: N/A;  . EYE SURGERY    . [place]   left  . LEFT HEART CATH AND CORONARY ANGIOGRAPHY N/A 07/10/2020   Procedure: LEFT HEART CATH AND CORONARY ANGIOGRAPHY;  Surgeon: Sherren Mocha, MD;  Location: [facility];  Service: Cardiovascular;  Laterality: N/A;  . LUMBAR LAMINECTOMY  2003  . LUNG BIOPSY Left 12/26/2016   Procedure: LUNG BIOPSY;  Surgeon: [name], MD;  Location: [place];  Service: Thoracic;  Laterality: Left;  . PACEMAKER IMPLANT N/A 03/30/2020   Procedure: PACEMAKER IMPLANT;  Surgeon: Evans Lance, MD;  Location: [place] [facility];  Service: Cardiovascular;  Laterality: N/A;  . POSTERIOR FUSION CERVICAL SPINE  2012  . TRIGGER FINGER RELEASE  2011   4th finger left hand  . VIDEO ASSISTED THORACOSCOPY Left 12/26/2016   Procedure: VIDEO ASSISTED THORACOSCOPY;  Surgeon: [name], MD;  Location: [place];  Service: Thoracic;  Laterality: Left;  Marland Kitchen VIDEO BRONCHOSCOPY N/A 12/26/2016   Procedure: VIDEO BRONCHOSCOPY;  Surgeon: [name], MD;  Location: [facility] OR;  Service: Thoracic;  Laterality: N/A;     Current Meds  Medication Sig  . albuterol (PROAIR HFA) 108 (90 Base) MCG/ACT inhaler Inhale 2 puffs into the lungs every 4 (four) hours as needed for wheezing or shortness of breath.  . Ascorbic Acid (VITAMIN C) 1000 MG tablet Take 1,000 mg by mouth daily.  Marland Kitchen aspirin EC 81 MG tablet Take 81 mg by mouth daily.  . clotrimazole-betamethasone (LOTRISONE) cream Apply 1 application topically daily as needed (for skin infection).   . Continuous Blood Gluc Sensor (FREESTYLE LIBRE 14 DAY SENSOR) MISC 1 patch every 14 (fourteen) days.  . diclofenac (VOLTAREN) 75 MG EC tablet Take 75 mg by mouth 2 (two) times daily.  Marland Kitchen EPINEPHrine 0.3 mg/0.3 mL IJ SOAJ injection Inject 0.3 mg into the muscle once as  needed for anaphylaxis.   . furosemide (LASIX) 40 MG tablet Take 20 mg by mouth daily.   . insulin detemir (LEVEMIR) 100 UNIT/ML injection Inject 12 Units into the skin at bedtime.   . insulin lispro (HUMALOG) 100 UNIT/ML injection Inject 76 Units into the skin daily. Use as directed with V-Go 40 insulin pump  . ketotifen (ZADITOR) 0.025 % ophthalmic solution Place 1 drop into both eyes 2 (two) times daily as needed (allergies).   . LORazepam (ATIVAN) 1 MG tablet Take 1 mg by mouth at bedtime as needed for sleep.   Marland Kitchen losartan (COZAAR) 50 MG tablet Take 50 mg by mouth daily.  . magnesium oxide (MAG-OX) 400 MG tablet TAKE 1 CAPSULE BY MOUTH ONCE DAILY  . metoprolol succinate (TOPROL-XL) 50 MG 24 hr tablet Take 1 tablet (50 mg total) by mouth daily. Take with or immediately following a meal.  .  Multiple Vitamin (MULTIVITAMIN WITH MINERALS) TABS tablet Take 1 tablet by mouth daily.  . pantoprazole (PROTONIX) 40 MG tablet Take 40 mg by mouth daily.    . potassium chloride SA (KLOR-CON) 20 MEQ tablet Take 20 mEq by mouth daily.      Allergies:   Codeine, Other, and Ofev [nintedanib]   Social History   Tobacco Use  . Smoking status: Never Smoker  . Smokeless tobacco: Never Used  Vaping Use  . Vaping Use: Never used  Substance Use Topics  . Alcohol use: No  . Drug use: No     Family Hx: The patient's family history includes Diabetes Mellitus II in his mother; Emphysema (age of onset: 28) in his father. There is no history of Colon cancer, Esophageal cancer, Rectal cancer, or Stomach cancer.  ROS:   Please see the history of present illness.    All other systems reviewed and are negative.   Prior CV studies:   The following studies were reviewed today:  Lt heart cath 07/10/2020-  1.  Severe three-vessel coronary artery disease with diffuse moderate to severe left main disease, severe stenosis of a large, dominant RCA, severe stenosis of the proximal left circumflex, and moderate  diffuse stenosis throughout the LAD with severe stenosis of the apical LAD 2.  Calcified aortic valve with mild aortic stenosis by echo assessment and no significant pullback gradient by cardiac catheterization  The patient has diffuse calcific three-vessel coronary artery disease and diabetes with CCS class III angina.  Recommend cardiac surgical consultation for consideration of CABG.  The aortic valve has significant calcification by fluoroscopy and is moderately difficult to cross with a wire.  Mean gradient by echo is 18 mmHg.  Consideration of concomitant aortic valve replacement if the patient goes for CABG.    Labs/Other Tests and Data Reviewed:    EKG:  An ECG dated 07/20/2020 was personally reviewed today and demonstrated:  NSR, ST, RBBB, LAFB  Recent Labs: 03/28/2020: TSH 3.258 03/31/2020: Magnesium 1.6 07/17/2020: ALT 21; B Natriuretic Peptide 210.9; BUN 23; Creatinine, Ser 1.45; Hemoglobin 14.0; Platelets 193; Potassium 4.9; Sodium 131   Recent Lipid Panel Lab Results  Component Value Date/Time   CHOL  02/04/2011 05:30 AM    127        ATP III CLASSIFICATION:  <200     mg/dL   Desirable  200-239  mg/dL   Borderline High  >=240    mg/dL   High          TRIG 329 (H) 07/17/2020 02:56 PM   HDL 29 (L) 02/04/2011 05:30 AM   CHOLHDL 4.4 02/04/2011 05:30 AM   LDLCALC  02/04/2011 05:30 AM    58        Total Cholesterol/HDL:CHD Risk Coronary Heart Disease Risk Table                     Men   Women  1/2 Average Risk   3.4   3.3  Average Risk       5.0   4.4  2 X Average Risk   9.6   7.1  3 X Average Risk  23.4   11.0        Use the calculated Patient Ratio above and the CHD Risk Table to determine the patient's CHD Risk.        ATP III CLASSIFICATION (LDL):  <100     mg/dL   Optimal  100-129  mg/dL   Near or  Above                    Optimal  130-159  mg/dL   Borderline  160-189  mg/dL   High  >190     mg/dL   Very High    Wt Readings from Last 3 Encounters:    09/09/20 264 lb (119.7 kg)  08/24/20 264 lb (119.7 kg)  08/19/20 265 lb (120.2 kg)     Objective:    Vital Signs:  BP 140/68   Pulse 67   Ht 6' (1.829 m)   Wt 264 lb (119.7 kg)   SpO2 96%   BMI 35.80 kg/m    VITAL SIGNS:  reviewed  ASSESSMENT & PLAN:    Exertional angina- Add low dose nitrate  CAD- 4 vessel Ca++ CAD- pt is considering CABG/AVR.  Second opinion is pending.  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  Plan: Add Imdur 30 mg daily - start with 1/2 tablet a day for first 3 days.   COVID-19 Education: The signs and symptoms of COVID-19 were discussed with the patient and how to seek care for testing (follow up with PCP or arrange E-visit).   The importance of social distancing was discussed today. The patient does not have symptoms concerning for COVID-19 infection (fever, chills, cough, or new shortness of breath).    Time:   Today, I have spent 20 minutes with the patient with telehealth technology discussing the above problems.     Medication Adjustments/Labs and Tests Ordered: Current medicines are reviewed at length with the patient today.  Concerns regarding medicines are outlined above.   Tests Ordered: No orders of the defined types were placed in this encounter.   Medication Changes: No orders of the defined types were placed in this encounter.   Follow Up:  In Person with Dr Stanford Breed in 3 months  Signed, Kerin Ransom, Hershal Coria  09/09/2020 8:18 AM    [place]

## 2020-09-15 ENCOUNTER — Encounter (INDEPENDENT_AMBULATORY_CARE_PROVIDER_SITE_OTHER): Payer: Medicare Other | Admitting: Ophthalmology

## 2020-09-15 ENCOUNTER — Telehealth: Payer: Self-pay | Admitting: *Deleted

## 2020-09-15 NOTE — Telephone Encounter (Signed)
Patient had follow-up with Kerin Ransom, PA-C on 09/09/2020.  He has been recommended for open heart surgery and AVR.  He is not a candidate for surgery at this time.  He has been referred to [facility] for second opinion.  I will remove him from the preop pool.  Please contact requesting office and let them know his surgery will need to be postponed until after his  cardiac evaluation.  Thank you.

## 2020-09-15 NOTE — Telephone Encounter (Signed)
   [facility] Pre-operative Risk Assessment    HEARTCARE STAFF: - Please ensure there is not already an duplicate clearance open for this procedure. - Under Visit Info/Reason for Call, type in Other and utilize the format Clearance MM/DD/YY or Clearance TBD. Do not use dashes or single digits. - If request is for dental extraction, please clarify the # of teeth to be extracted.  Request for surgical clearance:  1. What type of surgery is being performed? LEFT LOWER EYELID ENTROPION REPAIR   2. When is this surgery scheduled? TBD   3. What type of clearance is required (medical clearance vs. Pharmacy clearance to hold med vs. Both)? MEDICAL  4. Are there any medications that need to be held prior to surgery and how long? ASA    5. Practice name and name of physician performing surgery? [facility]; [place], [place]   6. What is the office phone number? [phone]   7.   What is the office fax number? [phone]  8.   Anesthesia type (None, local, MAC, general) ? MAC   Julaine Hua 09/15/2020, 11:52 AM  _________________________________________________________________   (provider comments below)

## 2020-09-15 NOTE — Telephone Encounter (Signed)
Left message for requesting office to please call our office so that we discuss clearance recommendations.

## 2020-09-16 NOTE — Telephone Encounter (Signed)
Left message x 2 to go discuss recommendations. I will also send notes to requesting office to please call our office to go over recommendations.

## 2020-09-16 NOTE — Telephone Encounter (Signed)
I returned Shane [name]'s call back to me , though had to leave a message again for a call back.

## 2020-09-16 NOTE — Telephone Encounter (Signed)
° °  Eliezer Lofts from [place], returning call

## 2020-09-17 NOTE — Telephone Encounter (Signed)
I will fax notes to requesting office pt will need to post pone surgery with [facility]. See previous notes. Will remove from the pre op call back pool.

## 2020-09-18 ENCOUNTER — Ambulatory Visit (HOSPITAL_COMMUNITY): Payer: Medicare Other

## 2020-09-21 ENCOUNTER — Telehealth: Payer: Self-pay | Admitting: Cardiology

## 2020-09-21 NOTE — Telephone Encounter (Signed)
See other message Patient had follow-up with Kerin Ransom, PA-C on 09/09/2020.  He has been recommended for open heart surgery and AVR.  He is not a candidate for surgery at this time.  He has been referred to [facility] for second opinion.  I will remove him from the preop pool.  Please contact requesting office and let them know his surgery will need to be postponed until after his  cardiac evaluation.  Thank you.  Forwarded to requesting providers office

## 2020-09-21 NOTE — Telephone Encounter (Signed)
Follow Up:      Shane [name] is returning a call from last week.

## 2020-09-22 ENCOUNTER — Ambulatory Visit (HOSPITAL_COMMUNITY)
Admission: RE | Admit: 2020-09-22 | Discharge: 2020-09-22 | Disposition: A | Discharge status: Home / Self Care | Payer: Medicare Other | Source: Ambulatory Visit | Attending: Internal Medicine | Admitting: Internal Medicine

## 2020-09-22 ENCOUNTER — Other Ambulatory Visit: Payer: Self-pay

## 2020-09-22 ENCOUNTER — Telehealth: Payer: Self-pay | Admitting: Internal Medicine

## 2020-09-22 DIAGNOSIS — J84112 Idiopathic pulmonary fibrosis: Secondary | ICD-10-CM

## 2020-09-22 DIAGNOSIS — Z006 Encounter for examination for normal comparison and control in clinical research program: Secondary | ICD-10-CM

## 2020-09-22 DIAGNOSIS — I2510 Atherosclerotic heart disease of native coronary artery without angina pectoris: Secondary | ICD-10-CM | POA: Insufficient documentation

## 2020-09-22 MED ORDER — STUDY - FIBROGEN - PAMREVLUMAB 10 MG/ML IV INFUSION (OPEN LABEL) (PI-RAMASWAMY)
30.0000 mg/kg | Freq: Once | INTRAVENOUS | Status: AC
  Administered 2020-09-22: 3670 mg via INTRAVENOUS
  Filled 2020-09-22: qty 367

## 2020-09-22 NOTE — Research (Signed)
Title: [id number] (FibroGen Study) is a Phase 3, randomized, double-blind, placebo-controlled multicenter international study to evaluate evaluate the efficacy and safety of 30 mg/kg IV infusions of pamrevlumab administered every 3 weeks for 52 weeks as compared to placebo in subjects with Idiopathic Pulmonary Fibrosis. Primary end point is: change in FVC from baseline at week [place] / Research RN note : This visit for Subject Shane [name] with DOB: Aug 13, 1941 on 09/22/2020 for the above protocol is Visit/Encounter #EX Week 45 and is for purpose of research. The consent for this encounter is under Protocol Version Amendment 5 and is currently IRB approved. Subject expressed continued interest and consent in continuing as a study subject. Subject confirmed that there was no change in contact information (e.g. address, telephone, email). Subject thanked for participation in research and contribution to science.   In this visit 09/22/2020 the subject is not required to be evaluated by the investigator.   All study visit EX Week 45 procedures and assessments were performed per the above mentioned protocol. AEs and Conmeds were reviewed at this time. No new AEs were reported, but changes were made to Conmeds. Subject has started IMDUR and Atorvastatin.  Per subject, decision regarding the recommended bypass surgery has been made. Subject is scheduled to undergo this procedure [id number].   Study drug was infused without problems. Please refer to the subject's paper source binder for further details of the visit. The subject will return in approximately 3 weeks for his next infusion.   Signed by Gwendolyn Grant Research Assistant [facility]  [place], [place] 10:37 AM 09/22/2020

## 2020-09-22 NOTE — Telephone Encounter (Signed)
Met with Shane [name] for soc quick questiion during his research infusion visit. He is at infusion center.  Reviewed his [facility] PFT - and compared to year ago there is some priogression esp in DLCO 12% in DLCO,. 0% in FVC. Avg 6% worse maybe  Feels he gets limited with chest pains and dyspnea with exertion - new since pace maker placed. Was not there last year. PPM placed April 2021. He has seen [facility] Dr Lincoln Brigham 09/17/20 and has een recommended CABG. Currently says dyspnea is more than before and chest pains are associated with hit. 7-10 days ago started on long acting NTG and chest pains better and dyspnea also some better. Never drops pusle ox with exertiona  Did have CT chest [facility]  A  - IPF some worse  - Angina bigger prioblem sincce spring 2021  Explained due to lack of hypxoemia at rest and with simple walk - risk with CABG is much lower than someone with o2 but still age, cKD, and IPF, balance issues are risk factors that can negatively impact him after CABG  However, angina threat and risk of sCD seems very high for next year and in myopion cardiac risk is> lung risk for next 1-3 years  Plan  - Support CABG at [place] despite risk concerns as long as Psychologist, sport and exercise feels comfortable - research infusion might have to stop if location for surgery is outside our study center - will have to talk to sponsor   ( Level 03: Esbt 20-29 min  visit spent in visit type: on-site physical face to visit in total care time and counseling or/and coordination of care by this undersigned MD - Dr Brand Males. This includes one or more of the following all delivered on this same day 09/22/2020: pre-charting, chart review, note writing, documentation discussion of test results, diagnostic or treatment recommendations, prognosis, risks and benefits of management options, instructions, education, compliance or risk-factor reduction. It excludes time spent by the [place] or office staff in the care of the  patient. Actual time was 25 min)    SIGNATURE    Dr. Brand Males, M.D., F.C.C.P,  Pulmonary and Critical Care Medicine Staff Physician, [place] Director - Interstitial Lung Disease  Program  Pulmonary [place] at [place], [place], [address]  Pager: [phone], If no answer  OR between  19:00-7:00h: page [phone] Telephone (clinical office): [phone] Telephone (research): [phone]  9:30 AM 09/22/2020     Results for Shane [name], Shane [name] (MRN [id number]) as of 05/23/2018 12:02  Ref. Range 10/14/2016 08:19 04/06/2017 10:13 10/04/2017 08:51 12/25/2017 08:45 05/23/2018 10:37 04/06/2019 research 07/10/2019 researhc 10/04/2019 after this clinici visit - research [facility] 09/17/20  FVC-Pre Latest Units: L 3.00 2.94 2.94 2.88 2.89 2.71 2.72 2.583 2.56  FVC-%Pred-Pre Latest Units: % 63 62 62 61 61 64% 64% 61.1% 61%    Results for Shane [name], Shane [name] (MRN [id number]) as of 05/23/2018 12:02  Ref. Range 10/14/2016 08:19 04/06/2017 10:13 10/04/2017 08:51 12/25/2017 08:45 05/23/2018 10:37 09/21/2018 - 09/17/20 [facility]   DLCO unc Latest Units: ml/min/mmHg 16.82 16.43 15.04 14.56 17.13 15.26 13.31   DLCO unc % pred Latest Units: % 46 45 41 40 47 43% 51%

## 2020-09-28 ENCOUNTER — Ambulatory Visit (INDEPENDENT_AMBULATORY_CARE_PROVIDER_SITE_OTHER): Payer: Medicare Other

## 2020-09-28 DIAGNOSIS — I459 Conduction disorder, unspecified: Secondary | ICD-10-CM | POA: Diagnosis not present

## 2020-09-29 LAB — CUP PACEART REMOTE DEVICE CHECK
Date Time Interrogation Session: 20211025100055
Implantable Lead Implant Date: 20210426
Implantable Lead Implant Date: 20210426
Implantable Lead Location: 753859
Implantable Lead Location: 753860
Implantable Lead Model: 377171
Implantable Lead Model: 377171
Implantable Lead Serial Number: 7000191418
Implantable Lead Serial Number: 7000203411
Implantable Pulse Generator Implant Date: 20210426
Pulse Gen Model: 407145
Pulse Gen Serial Number: 69858440

## 2020-10-01 ENCOUNTER — Encounter (INDEPENDENT_AMBULATORY_CARE_PROVIDER_SITE_OTHER): Payer: Medicare Other | Admitting: Ophthalmology

## 2020-10-01 NOTE — Progress Notes (Signed)
Remote pacemaker transmission.   

## 2020-10-07 ENCOUNTER — Encounter (INDEPENDENT_AMBULATORY_CARE_PROVIDER_SITE_OTHER): Payer: Medicare Other | Admitting: Ophthalmology

## 2020-10-12 ENCOUNTER — Ambulatory Visit (HOSPITAL_COMMUNITY)
Admission: RE | Admit: 2020-10-12 | Discharge: 2020-10-12 | Disposition: A | Discharge status: Home / Self Care | Payer: Medicare Other | Source: Ambulatory Visit | Attending: Internal Medicine | Admitting: Internal Medicine

## 2020-10-12 ENCOUNTER — Other Ambulatory Visit: Payer: Self-pay

## 2020-10-12 MED ORDER — STUDY - FIBROGEN - PAMREVLUMAB 10 MG/ML IV INFUSION (OPEN LABEL) (PI-RAMASWAMY)
30.0000 mg/kg | Freq: Once | INTRAVENOUS | Status: AC
  Administered 2020-10-12: 3650 mg via INTRAVENOUS
  Filled 2020-10-12: qty 365

## 2020-10-12 NOTE — Research (Signed)
Title: [id number] Open-Label Extension (OLE) is a multi-center, single-arm, open-label extension (OLE) phase where subjects who complete the Week 48 visit of the main study are eligible to participate. The extension to the main study allows for the continued evaluation of the efficacy and safety of 30 mg/kg IV infusions of pamrevlumab administered every 3 weeks for 52 weeks in subjects with Idiopathic Pulmonary Fibrosis.  Primary endpoint is: To provide continued access to pamrevlumab in subjects with IPF who completed the Week 48 visit of the main study  Protocol #: [id number], Clinical Trials #: [id number] Sponsor: www.fibrogen.com  ([place], [place], [place])  Scientist, physiological / Electrical engineer note : This visit for Subject Shane [name] with DOB: 05-24-1941 on 10/12/2020 for the above protocol is Visit/Encounter # Ex Week 54  and is for purpose of research. Subject expressed continued interest and consent in continuing as a study subject. Subject confirmed that there was  no change in contact information (e.g. address, telephone, email). Subject thanked for participation in research and contribution to science.   All procedures completed per that above mentioned protocol. Subject denies any complaints. He states he is scheduled for cardiac surgery at [facility] on November 04, 2020, he will be admitted a day prior for pre-op procedures. Subject is aware of response from study medical monitor regarding approval for the subject to receive his next infusion prior to the scheduled surgery. Advised the subject if his surgeon has any questions to reach out to the research site or Dr. Chase Caller and we will be happy to answer any questions regarding the study medication.   Refer to the subjects paper source binder or further details of the completed visit.  Signed by [place] Bing, CMA, BS, [place] Coordinator  [place], [place] 10:49 AM 10/12/2020

## 2020-10-22 ENCOUNTER — Ambulatory Visit (HOSPITAL_COMMUNITY): Payer: Medicare Other

## 2020-10-23 ENCOUNTER — Other Ambulatory Visit: Payer: Self-pay

## 2020-10-23 ENCOUNTER — Encounter (HOSPITAL_COMMUNITY)
Admission: RE | Admit: 2020-10-23 | Discharge: 2020-10-23 | Disposition: A | Discharge status: Home / Self Care | Payer: Medicare Other | Source: Ambulatory Visit | Attending: Internal Medicine | Admitting: Internal Medicine

## 2020-10-23 DIAGNOSIS — J84112 Idiopathic pulmonary fibrosis: Secondary | ICD-10-CM

## 2020-10-23 DIAGNOSIS — Z006 Encounter for examination for normal comparison and control in clinical research program: Secondary | ICD-10-CM

## 2020-10-23 MED ORDER — STUDY - FIBROGEN - PAMREVLUMAB 10 MG/ML IV INFUSION (OPEN LABEL) (PI-RAMASWAMY)
30.0000 mg/kg | Freq: Once | INTRAVENOUS | Status: AC
  Administered 2020-10-23: 3650 mg via INTRAVENOUS
  Filled 2020-10-23: qty 365

## 2020-10-23 NOTE — Research (Signed)
Title: [id number] (FibroGen Study) is a Phase 3, randomized, double-blind, placebo-controlled multicenter international study to evaluate evaluate the efficacy and safety of 30 mg/kg IV infusions of pamrevlumab administered every 3 weeks for 52 weeks as compared to placebo in subjects with Idiopathic Pulmonary Fibrosis. Primary end point is: change in FVC from baseline at week [place] / Research RN note : This visit for Subject Shane [name] with DOB: [birth year] on 10/23/2020 for the above protocol is Visit/Encounter #EX Week 51 and is for purpose of research. The consent for this encounter is under Protocol Version Amendment 5 and is currently IRB approved. Subject expressed continued interest and consent in continuing as a study subject. Subject confirmed that there was no change in contact information (e.g. address, telephone, email). Subject thanked for participation in research and contribution to science.   In this visit 09/22/2020 the subject is not required to be evaluated by the investigator.  All study visit EX Week 51 procedures and assessments were performed per the above mentioned protocol. Subject reported no changes in medication but reported self-monitored episodes of hypotension over the last week.   Study drug was infused without problems. Please refer to the subject's paper source binder for further details of the visit. The subject will return in approximately 3 weeks for his next infusion.  Signed by  [place] Assistant [facility] [place], [place] 4:21 PM 10/23/2020

## 2020-11-03 DIAGNOSIS — H9190 Unspecified hearing loss, unspecified ear: Secondary | ICD-10-CM | POA: Insufficient documentation

## 2020-11-04 DIAGNOSIS — Z951 Presence of aortocoronary bypass graft: Secondary | ICD-10-CM | POA: Insufficient documentation

## 2020-11-04 HISTORY — PX: CORONARY ARTERY BYPASS GRAFT: SHX141

## 2020-11-05 DIAGNOSIS — I519 Heart disease, unspecified: Secondary | ICD-10-CM | POA: Insufficient documentation

## 2020-11-07 DIAGNOSIS — G479 Sleep disorder, unspecified: Secondary | ICD-10-CM | POA: Insufficient documentation

## 2020-11-07 DIAGNOSIS — D62 Acute posthemorrhagic anemia: Secondary | ICD-10-CM

## 2020-11-07 HISTORY — DX: Acute posthemorrhagic anemia: D62

## 2020-11-14 DIAGNOSIS — E2740 Unspecified adrenocortical insufficiency: Secondary | ICD-10-CM | POA: Insufficient documentation

## 2020-11-14 DIAGNOSIS — I951 Orthostatic hypotension: Secondary | ICD-10-CM | POA: Insufficient documentation

## 2020-11-14 DIAGNOSIS — R5381 Other malaise: Secondary | ICD-10-CM | POA: Insufficient documentation

## 2020-11-17 ENCOUNTER — Ambulatory Visit (HOSPITAL_COMMUNITY): Payer: Medicare Other

## 2020-11-18 ENCOUNTER — Telehealth: Payer: Self-pay | Admitting: Internal Medicine

## 2020-11-18 DIAGNOSIS — G4733 Obstructive sleep apnea (adult) (pediatric): Secondary | ICD-10-CM

## 2020-11-18 NOTE — Telephone Encounter (Signed)
Spoke with wife [name], states pt had triple bypass sx on 11/04/20 at [facility], took cpap with them to [facility]. [name] believes that [facility] changed the cpap pressures.  States that the max pressure it will now reach is 8 cmH2O.  Pt took cpap to [facility] who advised that the settings had been changed from the previous settings of auto 5-20 cmH2O to set 8 cmH2O.  [facility] needs an order from our office to change pressures back to previous settings.   Order has been placed to reset pressures back to what our office prescribed for him.  Nothing further needed at this time- will close encounter.

## 2020-11-18 NOTE — Telephone Encounter (Signed)
Shane [name] called the research clinic and stated that since the patient was discharged from [facility] where he recently had cardiac surgery, he has been having issues with his CPAP machine and is not able to get proper sleep because of it. She is requesting someone call her asap to discuss the issues they are having. Please contact her on her cell # listed. I advised her that I would create a phone note and send it to Pulmonary Clinic triage group to have it addressed and that she will need to follow-up with them regarding this as it is not a research issue, but is a standard of care issue. She stated understanding and will follow-up with the clinic in the future. Triage please contact Shane [name] in regards to the Cpap issues.   Thanks,   [place] Bing

## 2020-11-23 NOTE — Telephone Encounter (Signed)
Sent message to [employer] to check on this order it was sent on 11/18/20 Joellen Jersey

## 2020-11-24 ENCOUNTER — Ambulatory Visit (HOSPITAL_COMMUNITY)
Admission: RE | Admit: 2020-11-24 | Discharge: 2020-11-24 | Disposition: A | Discharge status: Home / Self Care | Payer: Medicare Other | Source: Ambulatory Visit | Attending: Internal Medicine | Admitting: Internal Medicine

## 2020-11-24 ENCOUNTER — Other Ambulatory Visit: Payer: Self-pay

## 2020-11-24 DIAGNOSIS — Z006 Encounter for examination for normal comparison and control in clinical research program: Secondary | ICD-10-CM

## 2020-11-24 DIAGNOSIS — J84112 Idiopathic pulmonary fibrosis: Secondary | ICD-10-CM

## 2020-11-24 MED ORDER — STUDY - FIBROGEN - PAMREVLUMAB 10 MG/ML IV INFUSION (OPEN LABEL) (PI-RAMASWAMY)
30.0000 mg/kg | Freq: Once | INTRAVENOUS | Status: AC
  Administered 2020-11-24: 3650 mg via INTRAVENOUS
  Filled 2020-11-24: qty 365

## 2020-11-24 NOTE — Research (Signed)
Title: [id number] (FibroGen Study) is a Phase 3, randomized, double-blind, placebo-controlled multicenter international study to evaluate evaluate the efficacy and safety of 30 mg/kg IV infusions of pamrevlumab administered every 3 weeks for 52 weeks as compared to placebo in subjects with Idiopathic Pulmonary Fibrosis. Primary end point is: change in FVC from baseline at week 52.   Clinical Research Coordinator / Research RN note : This visit for Subject Shane [name] with DOB: July 06, 1941 on 11/24/2020 for the above protocol is Visit/Encounter #EX Week 54 and is for purpose of research. The consent for this encounter is under Protocol Version Amendment 5 and is currently IRB approved. Subject expressed continued interest and consent in continuing as a study subject. Subject confirmed that there was no change in contact information (e.g. address, telephone, email). Subject thanked for participation in research and contribution to science.   In this visit 11/24/2020 the subject is not required to be evaluated by the investigator.  All study visit EX Week 54 procedures and assessments were performed per the above mentioned protocol. Subject reported changes to his medications following his triple bypass surgery performed 11/04/2020. No new AEs were reported.  Study drug was infused without problems. Please refer to the subject's paper source binder for further details of the visit. The subject will return in approximately 3 weeks for his next infusion.  Signed by Roxie Assistant [facility]  [place], [place] 3:26 PM 11/24/2020

## 2020-11-24 NOTE — Telephone Encounter (Signed)
Pt's cpap was set on 11/19/20 Joellen Jersey

## 2020-11-30 ENCOUNTER — Telehealth: Payer: Self-pay

## 2020-11-30 NOTE — Telephone Encounter (Signed)
Biotronik alert received- AT episode details provided.  No documented history of this.  Current Meds include- ASA81mg , Metoprolol 50mg  daily  Attempted to reach pt to assess symptoms and med compliance.  No answer/ VM is full

## 2020-12-02 ENCOUNTER — Telehealth: Payer: Self-pay | Admitting: Internal Medicine

## 2020-12-02 NOTE — Telephone Encounter (Signed)
   Pt's wife calling would like to confirm if Dr. Lovena Le received the records from Dr. 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 She also wanted to know if Dr. Lovena Le would like to see pt for f/u

## 2020-12-02 NOTE — Telephone Encounter (Signed)
2nd attempt to reach pt.  No answer, VM box is full.  Called home phone # listed under spouse.  Spoke with pt.    Pt recently had CT surgery at [facility].  States has not felt well since surgery, some SOB and just overall feeling of not being well, was told to expect that during recovery.   Scheduled pt for OV on 12/16/20 with RU, PA.  Advised would forward to Dr. Lovena Le in case there are further recommendations.

## 2020-12-02 NOTE — Telephone Encounter (Signed)
Returned call to Pt's wife.  Explained difference between Dr. Lovena Le and [name].  Wife concerned about Pt's pacemaker and waiting until 1/12 to have it evaluated.  Rescheduled Pt for device clinic on 12/09/19 on same day Dr. Lovena Le is in clinic.  Will also have [employer] rep present.  Advised Pt should keep follow up with [name] as scheduled.  No further action at this time.

## 2020-12-05 NOTE — Telephone Encounter (Signed)
He will need an attempted ATP and if not a DCCV.

## 2020-12-07 ENCOUNTER — Other Ambulatory Visit (HOSPITAL_COMMUNITY): Payer: Self-pay | Admitting: *Deleted

## 2020-12-07 ENCOUNTER — Telehealth (HOSPITAL_COMMUNITY): Payer: Self-pay | Admitting: *Deleted

## 2020-12-07 DIAGNOSIS — Z951 Presence of aortocoronary bypass graft: Secondary | ICD-10-CM

## 2020-12-07 NOTE — Progress Notes (Deleted)
HPI: FU dyspnea/CAD. Patient has interstitial lung disease. Chest CT November 2017 showed interstitial lung disease, coronary atherosclerosis and dilated pulmonary artery. Patient had lung biopsy on January 22. Pathology consistent with usual interstitial pneumonia. He had bradycardia postoperatively and his beta blocker was discontinued. Patient previously seen recently with episodes of syncope.  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  Beta-blocker discontinued. Echocardiogram April 2021 showed ejection fraction 60 to 65%, mild left ventricular hypertrophy, grade 1 diastolic dysfunction, moderate left atrial enlargement, mild mitral regurgitation, mild aortic stenosis with mean gradient 18 mmHg and mildly dilated ascending aorta at 42 mm.  Had pacemaker implanted April 2021.  Cardiac catheterization August 2021 showed severe three-vessel coronary artery disease with 50 to 70% left main, 70% mid LAD, 90% distal LAD, 80% circumflex, 90% mid RCA, 75% distal RCA.  Patient was seen by Dr. Cyndia Bent and coronary artery bypass graft and aortic valve replacement recommended but patient wanted to consider.  Patient ultimately underwent coronary artery bypass and graft December 2021 at [facility] with LIMA to the LAD, saphenous vein graft to the first obtuse marginal and saphenous vein graft to the PDA.  Admitted with CHF at [facility] December 2021.  Echocardiogram showed ejection fraction 45 to 50%, mild left ventricular hypertrophy, mild mitral and tricuspid regurgitation, mild aortic stenosis though mean gradient 21 mmHg.  Since last seen,   Current Outpatient Medications  Medication Sig Dispense Refill  . albuterol (PROAIR HFA) 108 (90 Base) MCG/ACT inhaler Inhale 2 puffs into the lungs every 4 (four) hours as needed for wheezing or shortness of breath.    . Ascorbic Acid (VITAMIN C) 1000 MG tablet Take 1,000 mg by mouth daily.    Marland Kitchen aspirin EC 81 MG tablet Take 81 mg by mouth  daily.    . clotrimazole-betamethasone (LOTRISONE) cream Apply 1 application topically daily as needed (for skin infection).   0  . Continuous Blood Gluc Sensor (FREESTYLE LIBRE 14 DAY SENSOR) MISC 1 patch every 14 (fourteen) days.    . diclofenac (VOLTAREN) 75 MG EC tablet Take 75 mg by mouth 2 (two) times daily.    Marland Kitchen EPINEPHrine 0.3 mg/0.3 mL IJ SOAJ injection Inject 0.3 mg into the muscle once as needed for anaphylaxis.   0  . furosemide (LASIX) 40 MG tablet Take 20 mg by mouth daily.     . insulin detemir (LEVEMIR) 100 UNIT/ML injection Inject 12 Units into the skin at bedtime.     . insulin lispro (HUMALOG) 100 UNIT/ML injection Inject 76 Units into the skin daily. Use as directed with V-Go 40 insulin pump    . isosorbide mononitrate (IMDUR) 30 MG 24 hr tablet Take 1 tablet (30 mg total) by mouth daily. 90 tablet 3  . ketotifen (ZADITOR) 0.025 % ophthalmic solution Place 1 drop into both eyes 2 (two) times daily as needed (allergies).     . LORazepam (ATIVAN) 1 MG tablet Take 1 mg by mouth at bedtime as needed for sleep.     Marland Kitchen losartan (COZAAR) 50 MG tablet Take 50 mg by mouth daily.    . magnesium oxide (MAG-OX) 400 MG tablet TAKE 1 CAPSULE BY MOUTH ONCE DAILY 90 tablet 1  . metoprolol succinate (TOPROL-XL) 50 MG 24 hr tablet Take 1 tablet (50 mg total) by mouth daily. Take with or immediately following a meal. 90 tablet 3  . Multiple Vitamin (MULTIVITAMIN WITH MINERALS) TABS tablet Take 1 tablet by mouth daily.    Marland Kitchen  pantoprazole (PROTONIX) 40 MG tablet Take 40 mg by mouth daily.      . potassium chloride SA (KLOR-CON) 20 MEQ tablet Take 20 mEq by mouth daily.      No current facility-administered medications for this visit.     Past Medical History:  Diagnosis Date  . 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 Cataract   . Chronic kidney disease    d/t DM  . CKD (chronic kidney disease), stage III ([place])   . Diabetes mellitus    Vgo disposal insulin bolus  simular to insulin pump  . Dyspnea   .  GERD (gastroesophageal reflux disease)   . Hyperlipidemia   . Hypertension   . Idiopathic pulmonary fibrosis ([place]) 11/2016  . ILD (interstitial lung disease) ([place])   . Moderate aortic stenosis    a. 10/2019 Echo: EF 55-60%, Gr2 DD. Nl RV.   Marland Kitchen Neuromuscular disorder ([place])   . Nonobstructive CAD (coronary artery disease)    a. 2012 Cath: mod, nonobs dzs; b. 10/2016 MV: EF 60%, no ischemia.  Marland Kitchen PONV (postoperative nausea and vomiting)   . Sleep apnea     uses cpap asked to bring mask and tubing    Past Surgical History:  Procedure Laterality Date  . ANTERIOR FUSION CERVICAL SPINE  2012  . CARDIAC CATHETERIZATION  2011  . CARDIAC CATHETERIZATION N/A 11/09/2016   Procedure: Right Heart Cath;  Surgeon: Belva Crome, MD;  Location: [place] [facility];  Service: Cardiovascular;  Laterality: N/A;  . carpel tunnel     left wrist  . CATARACT EXTRACTION    . CATARACT EXTRACTION W/ INTRAOCULAR LENS  IMPLANT, BILATERAL  2013  . CERVICAL LAMINECTOMY  2012  . COLONOSCOPY N/A 01/14/2013   Procedure: COLONOSCOPY;  Surgeon: Irene Shipper, MD;  Location: [facility];  Service: Endoscopy;  Laterality: N/A;  . EYE SURGERY    . [place]   left  . LEFT HEART CATH AND CORONARY ANGIOGRAPHY N/A 07/10/2020   Procedure: LEFT HEART CATH AND CORONARY ANGIOGRAPHY;  Surgeon: Sherren Mocha, MD;  Location: [place] [facility];  Service: Cardiovascular;  Laterality: N/A;  . LUMBAR LAMINECTOMY  2003  . LUNG BIOPSY Left 12/26/2016   Procedure: LUNG BIOPSY;  Surgeon: [name], MD;  Location: [place];  Service: Thoracic;  Laterality: Left;  . PACEMAKER IMPLANT N/A 03/30/2020   Procedure: PACEMAKER IMPLANT;  Surgeon: Evans Lance, MD;  Location: [place] [facility];  Service: Cardiovascular;  Laterality: N/A;  . POSTERIOR FUSION CERVICAL SPINE  2012  . TRIGGER FINGER RELEASE  2011   4th finger left hand  . VIDEO ASSISTED THORACOSCOPY Left 12/26/2016   Procedure: VIDEO ASSISTED THORACOSCOPY;   Surgeon: [name], MD;  Location: [place];  Service: Thoracic;  Laterality: Left;  Marland Kitchen VIDEO BRONCHOSCOPY N/A 12/26/2016   Procedure: VIDEO BRONCHOSCOPY;  Surgeon: [name], MD;  Location: [place];  Service: Thoracic;  Laterality: N/A;    Social History   Socioeconomic History  . Marital status: Married    Spouse name: Not on file  . Number of children: Not on file  . Years of education: Not on file  . Highest education level: Not on file  Occupational History  . Not on file  Tobacco Use  . Smoking status: Never Smoker  . Smokeless tobacco: Never Used  Vaping Use  . Vaping Use: Never used  Substance and Sexual Activity  . Alcohol use: No  . Drug  use: No  . Sexual activity: Not Currently  Other Topics Concern  . 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    Social Determinants of Health   Financial Resource Strain: Not on file  Food Insecurity: Not on file  Transportation Needs: Not on file  Physical Activity: Not on file  Stress: Not on file  Social Connections: Not on file  Intimate Partner Violence: Not on file    Family History  Problem Relation Age of Onset  . Diabetes Mellitus II Mother   . Emphysema Father 79  . Colon cancer Neg Hx   . Esophageal cancer Neg Hx   . Rectal cancer Neg Hx   . Stomach cancer Neg Hx     ROS: no fevers or chills, productive cough, hemoptysis, dysphasia, odynophagia, melena, hematochezia, dysuria, hematuria, rash, seizure activity, orthopnea, PND, pedal edema, claudication. Remaining systems are negative.  Physical Exam: Well-developed well-nourished in no acute distress.  Skin is warm and dry.  HEENT is normal.  Neck is supple.  Chest is clear to auscultation with normal expansion.  Cardiovascular exam is irregular Abdominal exam nontender or distended. No masses palpated. Extremities show no edema. neuro grossly intact  ECG-atrial fibrillation, rate 96, no ST changes.   Personally reviewed  A/P  1 coronary artery disease status post coronary artery bypass and graft-patient is doing well status post coronary artery bypass and graft.  Continue medical therapy with aspirin and statin.  2 moderate aortic stenosis-patient did not have aortic valve replacement at time of coronary artery bypass and graft.  We will plan follow-up echocardiogram December 2022.  3 interstitial lung disease-follow-up pulmonary.  4 hypertension-patient's blood pressure is controlled.  Continue present medications and follow.  5 hyperlipidemia-continue statin.  6 status post pacemaker-followed by electrophysiology.  Kirk Ruths, MD

## 2020-12-07 NOTE — Telephone Encounter (Signed)
Received message from [place] at the  outpatient physical therapy on [facility].  They received a referral for this pt to have PT after his CABG at [facility].  Pt is eligible for cardiac rehab and we have a referral notification for him. Called and spoke to [place] who is known by the pulmonary staff from previous participation.  Shane [name] would like to do cardiac rehab and wondered why he was contacted by PT. Pt has completed his follow up with Dr. Lincoln Brigham at [facility] and will see [name] on 1/17. Asked about pt activity level.  Pt is not able to do much exercise that involves walking due to his prior history of foot surgery. Shane [name] made aware that we are scheduling out into the end of February. Encouraged pt to engage in chair aerobics during this interim. Shane [name] stated that he has been marching in place and using light bands.  Pt will be contacted for scheduling once he completes his follow up appt with [name] on 1/17.  Will have support staff verify insurance. Cherre Huger, BSN Cardiac and Training and development officer

## 2020-12-08 ENCOUNTER — Ambulatory Visit (INDEPENDENT_AMBULATORY_CARE_PROVIDER_SITE_OTHER): Payer: Medicare Other | Admitting: Emergency Medicine

## 2020-12-08 ENCOUNTER — Inpatient Hospital Stay (HOSPITAL_COMMUNITY)
Admission: EM | Admit: 2020-12-08 | Discharge: 2020-12-16 | DRG: 252 | Disposition: A | Discharge status: Home Health | Payer: Medicare Other | Attending: Internal Medicine | Admitting: Internal Medicine

## 2020-12-08 ENCOUNTER — Observation Stay (HOSPITAL_COMMUNITY): Payer: Medicare Other

## 2020-12-08 ENCOUNTER — Telehealth: Payer: Self-pay

## 2020-12-08 ENCOUNTER — Other Ambulatory Visit: Payer: Self-pay

## 2020-12-08 ENCOUNTER — Encounter (HOSPITAL_COMMUNITY): Payer: Self-pay | Admitting: Physician Assistant

## 2020-12-08 ENCOUNTER — Emergency Department (HOSPITAL_COMMUNITY): Payer: Medicare Other

## 2020-12-08 DIAGNOSIS — E1122 Type 2 diabetes mellitus with diabetic chronic kidney disease: Secondary | ICD-10-CM | POA: Diagnosis present

## 2020-12-08 DIAGNOSIS — I2510 Atherosclerotic heart disease of native coronary artery without angina pectoris: Secondary | ICD-10-CM | POA: Diagnosis present

## 2020-12-08 DIAGNOSIS — Z79899 Other long term (current) drug therapy: Secondary | ICD-10-CM

## 2020-12-08 DIAGNOSIS — K219 Gastro-esophageal reflux disease without esophagitis: Secondary | ICD-10-CM | POA: Diagnosis present

## 2020-12-08 DIAGNOSIS — Z825 Family history of asthma and other chronic lower respiratory diseases: Secondary | ICD-10-CM

## 2020-12-08 DIAGNOSIS — I214 Non-ST elevation (NSTEMI) myocardial infarction: Secondary | ICD-10-CM | POA: Diagnosis present

## 2020-12-08 DIAGNOSIS — Z7901 Long term (current) use of anticoagulants: Secondary | ICD-10-CM

## 2020-12-08 DIAGNOSIS — N1831 Chronic kidney disease, stage 3a: Secondary | ICD-10-CM | POA: Diagnosis not present

## 2020-12-08 DIAGNOSIS — R7881 Bacteremia: Secondary | ICD-10-CM | POA: Diagnosis not present

## 2020-12-08 DIAGNOSIS — I129 Hypertensive chronic kidney disease with stage 1 through stage 4 chronic kidney disease, or unspecified chronic kidney disease: Secondary | ICD-10-CM | POA: Diagnosis present

## 2020-12-08 DIAGNOSIS — R5381 Other malaise: Secondary | ICD-10-CM | POA: Diagnosis present

## 2020-12-08 DIAGNOSIS — Z20822 Contact with and (suspected) exposure to covid-19: Secondary | ICD-10-CM | POA: Diagnosis present

## 2020-12-08 DIAGNOSIS — R531 Weakness: Secondary | ICD-10-CM | POA: Diagnosis not present

## 2020-12-08 DIAGNOSIS — E1142 Type 2 diabetes mellitus with diabetic polyneuropathy: Secondary | ICD-10-CM | POA: Diagnosis present

## 2020-12-08 DIAGNOSIS — Z452 Encounter for adjustment and management of vascular access device: Secondary | ICD-10-CM

## 2020-12-08 DIAGNOSIS — I4891 Unspecified atrial fibrillation: Secondary | ICD-10-CM

## 2020-12-08 DIAGNOSIS — E669 Obesity, unspecified: Secondary | ICD-10-CM | POA: Diagnosis present

## 2020-12-08 DIAGNOSIS — L97519 Non-pressure chronic ulcer of other part of right foot with unspecified severity: Secondary | ICD-10-CM | POA: Diagnosis present

## 2020-12-08 DIAGNOSIS — Z885 Allergy status to narcotic agent status: Secondary | ICD-10-CM

## 2020-12-08 DIAGNOSIS — I4892 Unspecified atrial flutter: Secondary | ICD-10-CM | POA: Diagnosis present

## 2020-12-08 DIAGNOSIS — R001 Bradycardia, unspecified: Secondary | ICD-10-CM | POA: Diagnosis present

## 2020-12-08 DIAGNOSIS — Z959 Presence of cardiac and vascular implant and graft, unspecified: Secondary | ICD-10-CM

## 2020-12-08 DIAGNOSIS — Z951 Presence of aortocoronary bypass graft: Secondary | ICD-10-CM

## 2020-12-08 DIAGNOSIS — S91301A Unspecified open wound, right foot, initial encounter: Secondary | ICD-10-CM | POA: Diagnosis present

## 2020-12-08 DIAGNOSIS — Y831 Surgical operation with implant of artificial internal device as the cause of abnormal reaction of the patient, or of later complication, without mention of misadventure at the time of the procedure: Secondary | ICD-10-CM | POA: Diagnosis present

## 2020-12-08 DIAGNOSIS — R778 Other specified abnormalities of plasma proteins: Secondary | ICD-10-CM | POA: Diagnosis present

## 2020-12-08 DIAGNOSIS — I482 Chronic atrial fibrillation, unspecified: Secondary | ICD-10-CM | POA: Diagnosis not present

## 2020-12-08 DIAGNOSIS — Z8249 Family history of ischemic heart disease and other diseases of the circulatory system: Secondary | ICD-10-CM

## 2020-12-08 DIAGNOSIS — I459 Conduction disorder, unspecified: Secondary | ICD-10-CM | POA: Diagnosis not present

## 2020-12-08 DIAGNOSIS — D631 Anemia in chronic kidney disease: Secondary | ICD-10-CM | POA: Diagnosis present

## 2020-12-08 DIAGNOSIS — E222 Syndrome of inappropriate secretion of antidiuretic hormone: Secondary | ICD-10-CM | POA: Diagnosis present

## 2020-12-08 DIAGNOSIS — T827XXA Infection and inflammatory reaction due to other cardiac and vascular devices, implants and grafts, initial encounter: Secondary | ICD-10-CM | POA: Diagnosis not present

## 2020-12-08 DIAGNOSIS — E860 Dehydration: Secondary | ICD-10-CM | POA: Diagnosis present

## 2020-12-08 DIAGNOSIS — G4733 Obstructive sleep apnea (adult) (pediatric): Secondary | ICD-10-CM

## 2020-12-08 DIAGNOSIS — Z9989 Dependence on other enabling machines and devices: Secondary | ICD-10-CM

## 2020-12-08 DIAGNOSIS — E13621 Other specified diabetes mellitus with foot ulcer: Secondary | ICD-10-CM

## 2020-12-08 DIAGNOSIS — I330 Acute and subacute infective endocarditis: Secondary | ICD-10-CM

## 2020-12-08 DIAGNOSIS — E1152 Type 2 diabetes mellitus with diabetic peripheral angiopathy with gangrene: Secondary | ICD-10-CM | POA: Diagnosis present

## 2020-12-08 DIAGNOSIS — I480 Paroxysmal atrial fibrillation: Secondary | ICD-10-CM | POA: Diagnosis present

## 2020-12-08 DIAGNOSIS — E11621 Type 2 diabetes mellitus with foot ulcer: Secondary | ICD-10-CM

## 2020-12-08 DIAGNOSIS — Z6835 Body mass index (BMI) 35.0-35.9, adult: Secondary | ICD-10-CM

## 2020-12-08 DIAGNOSIS — N179 Acute kidney failure, unspecified: Secondary | ICD-10-CM | POA: Diagnosis present

## 2020-12-08 DIAGNOSIS — I471 Supraventricular tachycardia: Secondary | ICD-10-CM | POA: Diagnosis present

## 2020-12-08 DIAGNOSIS — L089 Local infection of the skin and subcutaneous tissue, unspecified: Secondary | ICD-10-CM | POA: Diagnosis present

## 2020-12-08 DIAGNOSIS — Z950 Presence of cardiac pacemaker: Secondary | ICD-10-CM

## 2020-12-08 DIAGNOSIS — J84112 Idiopathic pulmonary fibrosis: Secondary | ICD-10-CM | POA: Diagnosis present

## 2020-12-08 DIAGNOSIS — Z8616 Personal history of COVID-19: Secondary | ICD-10-CM

## 2020-12-08 DIAGNOSIS — E785 Hyperlipidemia, unspecified: Secondary | ICD-10-CM | POA: Diagnosis present

## 2020-12-08 DIAGNOSIS — L02611 Cutaneous abscess of right foot: Secondary | ICD-10-CM | POA: Diagnosis present

## 2020-12-08 DIAGNOSIS — B9561 Methicillin susceptible Staphylococcus aureus infection as the cause of diseases classified elsewhere: Secondary | ICD-10-CM | POA: Diagnosis present

## 2020-12-08 DIAGNOSIS — Z833 Family history of diabetes mellitus: Secondary | ICD-10-CM

## 2020-12-08 DIAGNOSIS — R059 Cough, unspecified: Secondary | ICD-10-CM | POA: Diagnosis present

## 2020-12-08 DIAGNOSIS — Z888 Allergy status to other drugs, medicaments and biological substances status: Secondary | ICD-10-CM

## 2020-12-08 DIAGNOSIS — L89323 Pressure ulcer of left buttock, stage 3: Secondary | ICD-10-CM | POA: Diagnosis present

## 2020-12-08 DIAGNOSIS — I70221 Atherosclerosis of native arteries of extremities with rest pain, right leg: Secondary | ICD-10-CM | POA: Diagnosis present

## 2020-12-08 DIAGNOSIS — I10 Essential (primary) hypertension: Secondary | ICD-10-CM | POA: Diagnosis present

## 2020-12-08 DIAGNOSIS — E871 Hypo-osmolality and hyponatremia: Secondary | ICD-10-CM | POA: Diagnosis present

## 2020-12-08 DIAGNOSIS — I350 Nonrheumatic aortic (valve) stenosis: Secondary | ICD-10-CM | POA: Diagnosis present

## 2020-12-08 LAB — CUP PACEART INCLINIC DEVICE CHECK
Brady Statistic RA Percent Paced: 10 %
Brady Statistic RV Percent Paced: 20 %
Date Time Interrogation Session: 20220104100347
Implantable Lead Implant Date: 20210426
Implantable Lead Implant Date: 20210426
Implantable Lead Location: 753859
Implantable Lead Location: 753860
Implantable Lead Model: 377171
Implantable Lead Model: 377171
Implantable Lead Serial Number: 7000191418
Implantable Lead Serial Number: 7000203411
Implantable Pulse Generator Implant Date: 20210426
Lead Channel Impedance Value: 487 Ohm
Lead Channel Impedance Value: 624 Ohm
Lead Channel Pacing Threshold Amplitude: 0.6 V
Lead Channel Pacing Threshold Pulse Width: 0.4 ms
Lead Channel Sensing Intrinsic Amplitude: 4.4 mV
Lead Channel Sensing Intrinsic Amplitude: 8.4 mV
Lead Channel Setting Pacing Amplitude: 3 V
Lead Channel Setting Pacing Amplitude: 4.6 V
Lead Channel Setting Pacing Pulse Width: 0.4 ms
Pulse Gen Model: 407145
Pulse Gen Serial Number: 69858440

## 2020-12-08 LAB — CBC WITH DIFFERENTIAL/PLATELET
Abs Immature Granulocytes: 0.03 10*3/uL (ref 0.00–0.07)
Basophils Absolute: 0 10*3/uL (ref 0.0–0.1)
Basophils Relative: 0 %
Eosinophils Absolute: 0.2 10*3/uL (ref 0.0–0.5)
Eosinophils Relative: 2 %
HCT: 35.8 % — ABNORMAL LOW (ref 39.0–52.0)
Hemoglobin: 11.9 g/dL — ABNORMAL LOW (ref 13.0–17.0)
Immature Granulocytes: 0 %
Lymphocytes Relative: 10 %
Lymphs Abs: 1 10*3/uL (ref 0.7–4.0)
MCH: 31.6 pg (ref 26.0–34.0)
MCHC: 33.2 g/dL (ref 30.0–36.0)
MCV: 95 fL (ref 80.0–100.0)
Monocytes Absolute: 1.6 10*3/uL — ABNORMAL HIGH (ref 0.1–1.0)
Monocytes Relative: 15 %
Neutro Abs: 7.3 10*3/uL (ref 1.7–7.7)
Neutrophils Relative %: 73 %
Platelets: 257 10*3/uL (ref 150–400)
RBC: 3.77 MIL/uL — ABNORMAL LOW (ref 4.22–5.81)
RDW: 13.4 % (ref 11.5–15.5)
WBC: 10.2 10*3/uL (ref 4.0–10.5)
nRBC: 0 % (ref 0.0–0.2)

## 2020-12-08 LAB — RESP PANEL BY RT-PCR (FLU A&B, COVID) ARPGX2
Influenza A by PCR: NEGATIVE
Influenza B by PCR: NEGATIVE
SARS Coronavirus 2 by RT PCR: NEGATIVE

## 2020-12-08 LAB — TROPONIN I (HIGH SENSITIVITY)
Troponin I (High Sensitivity): 27 ng/L — ABNORMAL HIGH (ref <18)
Troponin I (High Sensitivity): 30 ng/L — ABNORMAL HIGH (ref <18)

## 2020-12-08 LAB — COMPREHENSIVE METABOLIC PANEL
ALT: 19 U/L (ref 0–44)
AST: 20 U/L (ref 15–41)
Albumin: 3 g/dL — ABNORMAL LOW (ref 3.5–5.0)
Alkaline Phosphatase: 90 U/L (ref 38–126)
Anion gap: 13 (ref 5–15)
BUN: 37 mg/dL — ABNORMAL HIGH (ref 8–23)
CO2: 26 mmol/L (ref 22–32)
Calcium: 9.3 mg/dL (ref 8.9–10.3)
Chloride: 92 mmol/L — ABNORMAL LOW (ref 98–111)
Creatinine, Ser: 1.58 mg/dL — ABNORMAL HIGH (ref 0.61–1.24)
GFR, Estimated: 44 mL/min — ABNORMAL LOW (ref >60)
Glucose, Bld: 143 mg/dL — ABNORMAL HIGH (ref 70–99)
Potassium: 4.5 mmol/L (ref 3.5–5.1)
Sodium: 131 mmol/L — ABNORMAL LOW (ref 135–145)
Total Bilirubin: 1.2 mg/dL (ref 0.3–1.2)
Total Protein: 7.3 g/dL (ref 6.5–8.1)

## 2020-12-08 LAB — URINALYSIS, ROUTINE W REFLEX MICROSCOPIC
Bacteria, UA: NONE SEEN
Bilirubin Urine: NEGATIVE
Glucose, UA: NEGATIVE mg/dL
Hgb urine dipstick: NEGATIVE
Ketones, ur: NEGATIVE mg/dL
Leukocytes,Ua: NEGATIVE
Nitrite: NEGATIVE
Protein, ur: 100 mg/dL — AB
Specific Gravity, Urine: 1.014 (ref 1.005–1.030)
pH: 5 (ref 5.0–8.0)

## 2020-12-08 LAB — LACTIC ACID, PLASMA
Lactic Acid, Venous: 2.1 mmol/L (ref 0.5–1.9)
Lactic Acid, Venous: 1.5 mmol/L (ref 0.5–1.9)

## 2020-12-08 LAB — CBG MONITORING, ED: Glucose-Capillary: 98 mg/dL (ref 70–99)

## 2020-12-08 LAB — MAGNESIUM: Magnesium: 1.7 mg/dL (ref 1.7–2.4)

## 2020-12-08 LAB — BRAIN NATRIURETIC PEPTIDE: B Natriuretic Peptide: 195.4 pg/mL — ABNORMAL HIGH (ref 0.0–100.0)

## 2020-12-08 MED ORDER — APIXABAN 5 MG PO TABS: 5.0000 mg | ORAL_TABLET | Freq: Two times a day (BID) | ORAL | 11 refills | Status: DC

## 2020-12-08 MED ORDER — ALBUTEROL SULFATE HFA 108 (90 BASE) MCG/ACT IN AERS
2.0000 | INHALATION_SPRAY | RESPIRATORY_TRACT | Status: DC | PRN
  Filled 2020-12-08: qty 6.7

## 2020-12-08 MED ORDER — ACETAMINOPHEN 325 MG PO TABS
650.0000 mg | ORAL_TABLET | ORAL | Status: DC | PRN
  Administered 2020-12-13 (×2): 650 mg via ORAL
  Filled 2020-12-08 (×2): qty 2

## 2020-12-08 MED ORDER — PROCHLORPERAZINE EDISYLATE 10 MG/2ML IJ SOLN: 10.0000 mg | Freq: Four times a day (QID) | INTRAMUSCULAR | Status: DC | PRN

## 2020-12-08 MED ORDER — INSULIN ASPART 100 UNIT/ML ~~LOC~~ SOLN: 0.0000 [IU] | Freq: Three times a day (TID) | SUBCUTANEOUS | Status: DC

## 2020-12-08 MED ORDER — KETOTIFEN FUMARATE 0.025 % OP SOLN
1.0000 [drp] | Freq: Two times a day (BID) | OPHTHALMIC | Status: DC | PRN
  Filled 2020-12-08: qty 5

## 2020-12-08 MED ORDER — APIXABAN 5 MG PO TABS
5.0000 mg | ORAL_TABLET | Freq: Two times a day (BID) | ORAL | Status: DC
  Administered 2020-12-08 – 2020-12-09 (×3): 5 mg via ORAL
  Filled 2020-12-08 (×4): qty 1

## 2020-12-08 MED ORDER — POLYETHYLENE GLYCOL 3350 17 G PO PACK: 17.0000 g | PACK | Freq: Every day | ORAL | Status: DC | PRN

## 2020-12-08 MED ORDER — ATORVASTATIN CALCIUM 40 MG PO TABS
40.0000 mg | ORAL_TABLET | Freq: Every day | ORAL | Status: DC
  Administered 2020-12-09 – 2020-12-16 (×8): 40 mg via ORAL
  Filled 2020-12-08: qty 1
  Filled 2020-12-08: qty 4
  Filled 2020-12-08 (×6): qty 1

## 2020-12-08 MED ORDER — SODIUM CHLORIDE 0.9 % IV BOLUS
250.0000 mL | Freq: Once | INTRAVENOUS | Status: AC
  Administered 2020-12-08: 250 mL via INTRAVENOUS

## 2020-12-08 MED ORDER — MAGNESIUM SULFATE IN D5W 1-5 GM/100ML-% IV SOLN
1.0000 g | Freq: Once | INTRAVENOUS | Status: AC
  Administered 2020-12-09: 1 g via INTRAVENOUS
  Filled 2020-12-08: qty 100

## 2020-12-08 MED ORDER — SODIUM CHLORIDE 0.9 % IV SOLN: INTRAVENOUS | Status: AC

## 2020-12-08 MED ORDER — INSULIN DETEMIR 100 UNIT/ML ~~LOC~~ SOLN
20.0000 [IU] | Freq: Every day | SUBCUTANEOUS | Status: DC
  Administered 2020-12-08 – 2020-12-09 (×2): 20 [IU] via SUBCUTANEOUS
  Filled 2020-12-08 (×3): qty 0.2

## 2020-12-08 MED ORDER — METOPROLOL TARTRATE 50 MG PO TABS
50.0000 mg | ORAL_TABLET | Freq: Two times a day (BID) | ORAL | Status: DC
  Administered 2020-12-08 – 2020-12-10 (×4): 50 mg via ORAL
  Filled 2020-12-08 (×2): qty 1
  Filled 2020-12-08 (×2): qty 2

## 2020-12-08 MED ORDER — PANTOPRAZOLE SODIUM 40 MG PO TBEC
40.0000 mg | DELAYED_RELEASE_TABLET | Freq: Every day | ORAL | Status: DC
  Administered 2020-12-09 – 2020-12-16 (×8): 40 mg via ORAL
  Filled 2020-12-08 (×8): qty 1

## 2020-12-08 MED ORDER — INSULIN ASPART 100 UNIT/ML ~~LOC~~ SOLN
10.0000 [IU] | Freq: Three times a day (TID) | SUBCUTANEOUS | Status: DC
  Administered 2020-12-09: 10 [IU] via SUBCUTANEOUS

## 2020-12-08 MED ORDER — HYDRALAZINE HCL 20 MG/ML IJ SOLN: 10.0000 mg | Freq: Four times a day (QID) | INTRAMUSCULAR | Status: DC | PRN

## 2020-12-08 MED ORDER — PROCHLORPERAZINE EDISYLATE 10 MG/2ML IJ SOLN
10.0000 mg | Freq: Four times a day (QID) | INTRAMUSCULAR | Status: DC | PRN
  Administered 2020-12-09: 10 mg via INTRAVENOUS
  Filled 2020-12-08 (×2): qty 2

## 2020-12-08 MED ORDER — ONDANSETRON HCL 4 MG/2ML IJ SOLN
4.0000 mg | Freq: Four times a day (QID) | INTRAMUSCULAR | Status: DC | PRN
  Administered 2020-12-08 – 2020-12-13 (×2): 4 mg via INTRAVENOUS
  Filled 2020-12-08 (×2): qty 2

## 2020-12-08 MED ORDER — ASPIRIN EC 81 MG PO TBEC
81.0000 mg | DELAYED_RELEASE_TABLET | Freq: Every day | ORAL | Status: DC
  Administered 2020-12-09 – 2020-12-16 (×8): 81 mg via ORAL
  Filled 2020-12-08 (×8): qty 1

## 2020-12-08 NOTE — ED Notes (Signed)
Pt wife at bedside at this time.

## 2020-12-08 NOTE — ED Notes (Signed)
Urine culture sent down with u/a 

## 2020-12-08 NOTE — Patient Instructions (Addendum)
Stop taking Voltaren since you will be starting Eliquis. You prescription has been sent over to your pharmacy. Please call with any questions or concerns.

## 2020-12-08 NOTE — Consult Note (Addendum)
Cardiology Consultation:   Patient ID: Shane [name]; [id number]; May 02, 1941   Admit date: 12/08/2020 Date of Consult: 12/08/2020  Primary Care Provider: Burnard Bunting, MD Primary Cardiologist: Kirk Ruths, MD 05/21/2018 Primary Electrophysiologist:  Cristopher Peru, MD 12/08/2020  Patient Profile:   Shane [name] is a 80 y.o. 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  History of Present Illness:   Shane [name] was initially doing well after d/c. He had a good day yesterday, was up and down more than ever, although activity level is not high. He was running a low-grade temp and having chills   Today, he went to a previously scheduled appt w/ Dr Lovena [name]. He was in atrial fib with a controlled VR. He was started on Eliquis with the plan to come back for outpt DCCV on 01/06/2021.   After going home, he felt very weak, he went to bed and slept x 3 hr. He was unable to get to the bathroom by himself. He was very SOB, still shivering and temp >101 by EMS. EMS called because he was so weak and unable to stand unaided.   He took Mucinex today for his cough, which seemed to improve. He was afraid that he had gotten Covid again, but did not lose his sense of taste. Covid test today is negative.    Past Medical History:  Diagnosis Date  . 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 Cataract   . Chronic kidney disease    d/t DM  . CKD (chronic kidney disease), stage III ([place])   . Diabetes mellitus    Vgo disposal insulin bolus  simular to insulin pump  . Dyspnea   . GERD (gastroesophageal reflux disease)   . Hyperlipidemia   . Hypertension   . 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 ILD (interstitial lung disease) ([place])    . Moderate aortic stenosis    a. 10/2019 Echo: EF 55-60%, Gr2 DD. Nl RV.   Marland Kitchen Neuromuscular disorder ([place])   . Nonobstructive CAD (coronary artery disease)    a. 2012 Cath: mod, nonobs dzs; b. 10/2016 MV: EF 60%, no ischemia.  Marland Kitchen PONV (postoperative nausea and vomiting)   . Sleep apnea     uses cpap asked to bring mask and tubing    Past Surgical History:  Procedure Laterality Date  . ANTERIOR FUSION CERVICAL SPINE  2012  . CARDIAC CATHETERIZATION  2011  . CARDIAC CATHETERIZATION N/A 11/09/2016   Procedure: Right Heart Cath;  Surgeon: Belva Crome, MD;  Location: [place] [facility];  Service: Cardiovascular;  Laterality: N/A;  . carpel tunnel     left wrist  . CATARACT EXTRACTION    . CATARACT EXTRACTION W/ INTRAOCULAR LENS  IMPLANT, BILATERAL  2013  . CERVICAL LAMINECTOMY  2012  . COLONOSCOPY N/A 01/14/2013   Procedure: COLONOSCOPY;  Surgeon: Irene Shipper, MD;  Location: [facility];  Service: Endoscopy;  Laterality: N/A;  . EYE SURGERY    . [place]   left  . LEFT HEART CATH AND CORONARY ANGIOGRAPHY N/A 07/10/2020   Procedure: LEFT HEART CATH AND CORONARY ANGIOGRAPHY;  Surgeon: Sherren Mocha, MD;  Location: [place] [facility];  Service: Cardiovascular;  Laterality: N/A;  . LUMBAR LAMINECTOMY  2003  . LUNG BIOPSY Left 12/26/2016   Procedure: LUNG BIOPSY;  Surgeon: [name], MD;  Location: [place];  Service: Thoracic;  Laterality: Left;  . PACEMAKER IMPLANT N/A 03/30/2020   Procedure: PACEMAKER IMPLANT;  Surgeon: Evans Lance, MD;  Location: [place] [facility];  Service: Cardiovascular;  Laterality: N/A;  . POSTERIOR FUSION CERVICAL SPINE  2012  . TRIGGER FINGER RELEASE  2011   4th finger left hand  . VIDEO ASSISTED THORACOSCOPY Left 12/26/2016   Procedure: VIDEO ASSISTED THORACOSCOPY;  Surgeon: [name], MD;  Location: [place];  Service: Thoracic;  Laterality: Left;  Marland Kitchen VIDEO BRONCHOSCOPY N/A 12/26/2016   Procedure: VIDEO BRONCHOSCOPY;  Surgeon:  [name], MD;  Location: [place];  Service: Thoracic;  Laterality: N/A;     Prior to Admission medications   Medication Sig Start Date End Date Taking? Authorizing Provider  albuterol (VENTOLIN HFA) 108 (90 Base) MCG/ACT inhaler Inhale 2 puffs into the lungs every 4 (four) hours as needed for wheezing or shortness of breath.    [provider]  apixaban (ELIQUIS) 5 MG TABS tablet Take 1 tablet (5 mg total) by mouth 2 (two) times daily. 12/08/20   Evans Lance, MD  Ascorbic Acid (VITAMIN C) 1000 MG tablet Take 1,000 mg by mouth daily.    [provider]  aspirin EC 81 MG tablet Take 81 mg by mouth daily.    [provider]  clotrimazole-betamethasone (LOTRISONE) cream Apply 1 application topically daily as needed (for skin infection).  01/16/17   [provider]  Continuous Blood Gluc Sensor (FREESTYLE LIBRE 14 DAY SENSOR) MISC 1 patch every 14 (fourteen) days.    [provider]  EPINEPHrine 0.3 mg/0.3 mL IJ SOAJ injection Inject 0.3 mg into the muscle once as needed for anaphylaxis.  01/30/18   [provider]  furosemide (LASIX) 40 MG tablet Take 20 mg by mouth daily.  02/15/18   [provider]  insulin detemir (LEVEMIR) 100 UNIT/ML injection Inject 12 Units into the skin at bedtime.     [provider]  insulin lispro (HUMALOG) 100 UNIT/ML injection Inject 76 Units into the skin daily. Use as directed with V-Go 40 insulin pump    [provider]  isosorbide mononitrate (IMDUR) 30 MG 24 hr tablet Take 1 tablet (30 mg total) by mouth daily. 09/09/20 12/08/20  Erlene Quan, PA-C  ketotifen (ZADITOR) 0.025 % ophthalmic solution Place 1 drop into both eyes 2 (two) times daily as needed (allergies).     [provider]  LORazepam (ATIVAN) 1 MG tablet Take 1 mg by mouth at bedtime as needed for sleep.  03/20/20   [provider]  losartan (COZAAR) 50 MG tablet Take 50 mg by mouth daily.     [provider]  magnesium oxide (MAG-OX) 400 MG tablet TAKE 1 CAPSULE BY MOUTH ONCE DAILY 08/11/20   Brand Males, MD  metoprolol succinate (TOPROL-XL) 50 MG 24 hr tablet Take 1 tablet (50 mg total) by mouth daily. Take with or immediately following a meal. 07/08/20   Evans Lance, MD  Multiple Vitamin (MULTIVITAMIN WITH MINERALS) TABS tablet Take 1 tablet by mouth daily.    [provider]  pantoprazole (PROTONIX) 40 MG tablet Take 40 mg by mouth daily.    [provider]  potassium chloride SA (KLOR-CON) 20 MEQ tablet Take 20 mEq by mouth daily.  [provider]    Inpatient Medications: Scheduled Meds:  Continuous Infusions:  PRN Meds:   Allergies:    Allergies  Allergen Reactions  . Codeine Hives and Itching  . Other Diarrhea and Other (See Comments)    Esbriet causes elevated LFTs. D/C on 06/14/17 and SEVERE DIARRHEA  . Ofev [Nintedanib] Diarrhea    SEVERE DIARRHEA    Social History:   Social History   Socioeconomic History  . Marital status: Married    Spouse name: Not on file  . Number of children: Not on file  . Years of education: Not on file  . Highest education level: Not on file  Occupational History  . Not on file  Tobacco Use  . Smoking status: Never Smoker  . Smokeless tobacco: Never Used  Vaping Use  . Vaping Use: Never used  Substance and Sexual Activity  . Alcohol use: No  . Drug use: No  . Sexual activity: Not Currently  Other Topics Concern  . 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    Social Determinants of Health   Financial Resource Strain: Not on file  Food Insecurity: Not on file  Transportation Needs: Not on file  Physical Activity: Not on file  Stress: Not on file  Social Connections: Not on file  Intimate Partner Violence: Not on file    Family History:   Family History  Problem Relation Age of Onset  . Diabetes Mellitus II Mother   . Emphysema Father  42  . Heart attack Father   . Colon cancer Neg Hx   . Esophageal cancer Neg Hx   . Rectal cancer Neg Hx   . Stomach cancer Neg Hx    Family Status:  Family Status  Relation Name Status  . Mother  Deceased  . Father  Deceased  . MGM  Deceased  . MGF  Deceased  . PGM  Deceased  . PGF  Deceased  . Neg Hx  (Not Specified)    ROS:  Please see the history of present illness.  All other ROS reviewed and negative.     Physical Exam/Data:   Vitals:   12/08/20 1815 12/08/20 1830 12/08/20 1845 12/08/20 1900  BP: 133/67 (!) 115/40 (!) 128/51 (!) 117/51  Pulse: 68 76 85 75  Resp: (!) 22 (!) 23 (!) 24 (!) 25  Temp:      TempSrc:      SpO2: 99% 97% 100% 96%  Weight:      Height:       No intake or output data in the 24 hours ending 12/08/20 2000  Last 3 Weights 12/08/2020 12/08/2020 09/09/2020  Weight (lbs) 246 lb 14.6 oz 247 lb 264 lb  Weight (kg) 112 kg 112.038 kg 119.75 kg     Body mass index is 33.49 kg/m.   General:  Well nourished, well developed, male in no acute distress HEENT: normal Lymph: no adenopathy Neck: JVD - not seen elevated, but difficult to assess 2nd body habitus Endocrine:  No thryomegaly Vascular: No carotid bruits; 4/4 extremity pulses 2+  Cardiac:  normal S1, S2; RRR; 2/6 murmur Lungs:  Some basilar rales bilaterally, no wheezing, rhonchi   Abd: soft, nontender, no hepatomegaly  Ext: no edema Musculoskeletal:  No deformities, BUE and BLE strength normal and equal Skin: warm and dry. Incision w/out signs of infection, small decubitus on buttocks w/out drainage Neuro:  CNs 2-12 intact, no focal abnormalities noted Psych:  Normal affect   EKG:  The EKG was personally reviewed and demonstrates:  Coarse Afib vs flutter, HR 94, no acute ischemic changes Telemetry:  Telemetry was personally reviewed and demonstrates:  Atrial fib vs flutter, HR high-normal   CV studies:   ECHO: 11/09/2020 AORTA     Values   Normal RangeMAIN PA   Values    Normal Range    Annulus: nm* cm  [2 - 3.2]   PA Main:nm* cm  [1.5 - 2.1]   Aorta Sin: nm* cm  [2.8 - 4]  RIGHT VENTRICLE  ST Junction: nm* cm  [2.3 - 3.5]  RV Base: nm* cm  [2.5 - 4.1]   Asc.Aorta: nm* cm  [2.2 - 3.8]   RV Mid: nm* cm  [1.9 - 3.5]  LEFT VENTRICLE             RV Length: nm* cm  [ ]      LVIDd: nm* cm  [4.2 - 5.8] RIGHT ATRIUM     LVIDs: nm* cm  [2.4 - 4]   RA Area: nm* cm2  [ <= 20]    LVEDVi: nm* ml/m2 [34 - 74]     RAVi: nm* ml/m2 [11 - 39]    LVESVi: nm* ml/m2 [11 - 31]  INFERIOR VENA CAVA      FS: nm* %   [ >= 25]    Max.IVC:  1.5 cm  [ <= 2.1]      SWT: nm* cm  [0.6 - 1]   Min.IVC: nm* cm  [ <= 1.7]      PWT: nm* cm  [0.6 - 1]  __________________  LEFTATRIUM              nm* - not measured    LA Diam: nm* cm  [3 - 4]    LA Area: nm* cm2  [ <= 20]   LA Volume: nm* mL  [18 - 58]     LAVi: nm* ml/m2 [16 - 34]  LEFT VENTRICLE                   Anterior: Normal     Size: Not seen                Lateral: HYPOCONTRACTILE  Contraction: REGIONALLY IMPAIRED           Septal: DYSKINETIC  Closest EF: 50% (Estimated)            Apical: Normal   LV masses: No Masses               Inferior: HYPOCONTRACTILE      LVH: MILD LVH               Posterior: Normal  Dias.FxClass: N/A   MITRAL VALVE   Leaflets: Normal         Mobility: Fully mobile  Morphology: Normal   LEFT ATRIUM     Size: Normal   LA masses: No masses        Normal IAS   PULMONIC VALVE  Morphology: Normal   Mobility: Fully Mobile   RIGHT VENTRICLE     Size: Normal          Free wall: Normal  Contraction: Normal          RV masses: CATHETER IN RV   TRICUSPID VALVE    Leaflets: Normal         Mobility: Fully mobile  Morphology: Normal   RIGHT ATRIUM  Size: Normal           RA Other: None   RA masses:CATHETER IN RA   PERICARDIUM     Fluid: No effusion   INFERIOR VENACAVA     Size: Normal   ABNORMAL RESPIRATORY COLLAPSE   DOPPLER ECHO and OTHER SPECIAL PROCEDURES ------------------------------------   Aortic: No AR         MILD AS   2.9 m/s peak vel  34 mmHg peak grad 21 mmHg mean grad  LVOT Diam: 2.1 cm.    Mitral: MILD MR        No MS   MV Inflow E Vel.= nm* cm/s MV Annulus E'Vel.= nm* cm/s E/E'Ratio= nm*   Tricuspid: MILD TR        No TS       2.4 m/s peak TR vel  30 mmHg peak RV pressure   Pulmonary: No PR         No PS    Other:       DEFINITY CONTRAST SHOWS ENHANCED LV BORDERS   INTERPRETATION ---------------------------------------------------------------  MILD LV DYSFUNCTION (See above) WITH MILD LVH  NORMAL RIGHT VENTRICULAR SYSTOLIC FUNCTION  VALVULAR REGURGITATION: MILD MR, MILD TR  VALVULAR STENOSIS: MILD AS  S/P RECENT CABG  IRREGULAR RHYTHM THROUGHOUT EXAM  POOR SOUND TRANSMISSION DESPITETHE USE OF DEFINITY  LVEF 45-50%  NO PRIOR STUDY FOR COMPARISON    CATH: 07/10/2020 1.  Severe three-vessel coronary artery disease with diffuse moderate to severe left main disease, severe stenosis of a large, dominant RCA, severe stenosis of the proximal left circumflex, and moderate diffuse stenosis throughout the LAD with severe stenosis of the apical LAD 2.  Calcified aortic valve with mild aortic stenosis by echo assessment and no significant pullback gradient by cardiac catheterization  The patient has diffuse calcific three-vessel coronary artery disease and diabetes with CCS class III angina.  Recommend cardiac surgical consultation for consideration of CABG.  The aortic valve has significant calcification  by fluoroscopy and is moderately difficult to cross with a wire.  Mean gradient by echo is 18 mmHg.  Consideration of concomitant aortic valve replacement if the patient goes for CABG.  Laboratory Data:   Chemistry Recent Labs  Lab 12/08/20 1805  NA 131*  K 4.5  CL 92*  CO2 26  GLUCOSE 143*  BUN 37*  CREATININE 1.58*  CALCIUM 9.3  GFRNONAA 44*  ANIONGAP 13    Lab Results  Component Value Date   ALT 19 12/08/2020   AST 20 12/08/2020   ALKPHOS 90 12/08/2020   BILITOT 1.2 12/08/2020   Hematology Recent Labs  Lab 12/08/20 1805  WBC 10.2  RBC 3.77*  HGB 11.9*  HCT 35.8*  MCV 95.0  MCH 31.6  MCHC 33.2  RDW 13.4  PLT 257   Cardiac Enzymes High Sensitivity Troponin:   Recent Labs  Lab 12/08/20 1805  TROPONINIHS 27*      BNP Recent Labs  Lab 12/08/20 1805  BNP 195.4*    TSH:  Lab Results  Component Value Date   TSH 3.258 03/28/2020   Lipids: Lab Results  Component Value Date   CHOL  02/04/2011    127        ATP III CLASSIFICATION:  <200     mg/dL   Desirable  200-239  mg/dL   Borderline High  >=240    mg/dL   High          HDL 29 (L) 02/04/2011   [place]  02/04/2011    58        Total Cholesterol/HDL:CHD Risk Coronary Heart Disease Risk Table                     Men   Women  1/2 Average Risk   3.4   3.3  Average Risk       5.0   4.4  2 X Average Risk   9.6   7.1  3 X Average Risk  23.4   11.0        Use the calculated Patient Ratio above and the CHD Risk Table to determine the patient's CHD Risk.        ATP III CLASSIFICATION (LDL):  <100     mg/dL   Optimal  100-129  mg/dL   Near or Above                    Optimal  130-159  mg/dL   Borderline  160-189  mg/dL   High  >190     mg/dL   Very High   TRIG 329 (H) 07/17/2020   CHOLHDL 4.4 02/04/2011   HgbA1c: Lab Results  Component Value Date   HGBA1C 7.6 (H) 03/28/2020   Magnesium:  Magnesium  Date Value Ref Range Status  03/31/2020 1.6 (L) 1.7 - 2.4 mg/dL Final    Comment:     Performed at [facility], [place] [address], [place], [place] [address]     Radiology/Studies:  [place] Chest Port 1 View  Result Date: 12/08/2020 CLINICAL DATA:  Shortness of breath EXAM: PORTABLE CHEST 1 VIEW COMPARISON:  07/17/2020, CT 09/30/2019, 03/31/2020, 04/27/2018 FINDINGS: Surgical hardware in the cervical spine. Left-sided pacing device as before. Interval post sternotomy changes. Diffuse bilateral left greater than right reticular and ground-glass opacity, likely corresponding to history of pulmonary fibrosis. Chronic pleural scarring on the left. No acute airspace consolidation or pneumothorax. Stable cardiomediastinal silhouette. IMPRESSION: 1. Stable radiographic appearance of the chest with pulmonary fibrosis. No acute airspace disease. Electronically Signed   By: Donavan Foil M.D.   On: 12/08/2020 19:05   CUP PACEART INCLINIC DEVICE CHECK  Result Date: 12/08/2020 Pacemaker check in clinic. Normal device function. Thresholds, sensing, impedances consistent with previous measurements. Device programmed to maximize longevity. AT/AF burden 24%, appear AF w/ controlled VR. No OAC on file. Patient will start Eliquis and come back 01/06/21 to attempt in office ATP. No high ventricular rates noted. Device programmed at appropriate safety margins. Histogram distribution appropriate for patient activity level. Device programmed to optimize intrinsic conduction. Estimated longevity 8 years 11 months. Patient enrolled in remote follow-up. Patient education completed. ROV w/ Dr. Lovena [name] 01/06/21.Shane [name], BSN, RN   Assessment and Plan:   1. Atrial fib - unclear duration, has likely been in AF for a week or more - given rx for Eliquis, has not started yet - symptomatic and not tolerating well, need to make sure no other reasons  - HR generally well-controlled, continue BB - EP to see in am, possible TEE/DCCV vs TEE/ATP after sufficient Eliquis load  2. Nausea - unclear cause, Zofran  ordered  3. Fever - per EMS, temp > 101 - CXR OK, no urinary problems, UA ok - mgt per IM  4. Hx CAD, s/p CABG 11/04/2020 - mild elevation in troponin c/w acute illness, possibly 2nd elevated HR - continue to follow, but no indication of graft failure - no further workup indicated  Otherwise, per IM Active  Problems:   * No active hospital problems. *  For questions or updates, please contact [place] Please consult [email or web address] for contact info under Cardiology/STEMI.   Signed, Rosaria Ferries, PA-C  12/08/2020 8:00 PM  The patient was seen, examined and discussed with Rosaria Ferries, PA-C and I agree with the above.   80 y.o. 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 The patient was discharged from [facility] on 11/18/20 and was feeling progressively better, advancing his activities.  He was called by our office on 11/30/2020 for medical alert from his PM interrogation - new atrial fibrillation - no anticoagulation was started.  He had a good day yesterday, then developed fever, chills at night, folloowed by a profound fatigue. Mildly productive cough with greenish sputum.  He went to see Dr Lovena [name] in the clinic this am.  Today, he went to a previously scheduled appt w/ Dr Lovena [name]. He was in atrial fib with a controlled VR. He was prescribed Eliquis with the plan to come back for outpt DCCV on 01/06/2021. The patient went home, slept, upon awakening felt more tired, having chills, unable to get up, came to ER.  In ER - physical exam, no JVDs, no lung cracklesno [name] edema, S1,2 irregular, warm extremities, compression socks in place.   He feels warm and nauseated, no  appetite, no oral intake today. Labs:  Crea 1.58, Na 131, K 4.5, LFTs normal, BNP 195, roponin 27->30, lactic acid 2.1, WBC 10.2, Hb 11.9, negative covid test, U/A + protein, negative leukocytes, nitrates. CXR: pulmonary fibrosis, no obvious CHF or pneumonia.  A/P  New atrial fibrillation - since 11/30/20 - we will start Eliquis 5 mg PO BID, plan for a TEE/DCCV (or ATP) after 3 doses of Eliquis  Underlying infectious process - retest Covid, follow chest CT for pneumonia - CXR might be inaccurate in the settings of pulmonary fibrosis, ATB per primary team  CAD, s/p CABG, continue ASA, metoprolol, atorvastatin, losartan  2. AVB, s/p PM placement  Decubitus ulcer - on his buttocks - no obvious cellulitis, apply dressing  Ena Dawley, MD 12/08/2020

## 2020-12-08 NOTE — ED Notes (Addendum)
RT set up CPAP currently at bedside, CPAP will be placed to pt per request. Pt reports nausea at this time.  Pt has 3 small like wounds, 2 pin sized black and round in appearance, the third dime sized circular and black in appearance noted to lateral side of right foot, pt spouse reports providing wound care to areas but reports wound growing in size. [place], MD made aware.

## 2020-12-08 NOTE — ED Notes (Signed)
Ok to give ice water per MD

## 2020-12-08 NOTE — ED Provider Notes (Signed)
[facility] EMERGENCY DEPARTMENT Provider Note   CSN: [id number] Arrival date & time: 12/08/20  1734     History Chief Complaint  Patient presents with  . Weakness  . Shortness of Breath  . Cough    Shane [name] is a 80 y.o. male.  80 yo M with a chief complaints of fatigue.  This been going on for a few days now.  Actually felt his best yesterday after he had had three-way bypass surgery done at [facility].  The patient went to cardiology follow-up today and was found to be in atrial fibrillation.  He feels like that is made him feel very fatigued.  Has been having a lot of trouble getting around at home.  Was unable even to sit up in a chair once he had gotten back home and so called 911 to come here for evaluation.  Has been coughing since yesterday.  Took some Mucinex which has improved his cough.  Denies any chest pain.  Has some mild pain to his sternum with palpation.  No fevers.  Has some mild lower extremity edema.  Has been able to eat and drink normally.  The history is provided by the patient.  Weakness Severity:  Moderate Onset quality:  Gradual Duration:  4 days Timing:  Constant Progression:  Worsening Chronicity:  New Relieved by:  Nothing Worsened by:  Nothing Ineffective treatments:  None tried Associated symptoms: cough and shortness of breath   Associated symptoms: no abdominal pain, no arthralgias, no chest pain, no diarrhea, no fever, no headaches, no myalgias and no vomiting   Shortness of Breath Associated symptoms: cough   Associated symptoms: no abdominal pain, no chest pain, no fever, no headaches, no rash and no vomiting   Cough Associated symptoms: shortness of breath   Associated symptoms: no chest pain, no chills, no eye discharge, no fever, no headaches, no myalgias and no rash        Past Medical History:  Diagnosis Date  . 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 Cataract   . Chronic kidney disease    d/t DM  . CKD (chronic kidney  disease), stage III ([place])   . Diabetes mellitus    Vgo disposal insulin bolus  simular to insulin pump  . Dyspnea   . GERD (gastroesophageal reflux disease)   . Hyperlipidemia   . Hypertension   . Idiopathic pulmonary fibrosis ([place]) 11/2016  . ILD (interstitial lung disease) ([place])   . Moderate aortic stenosis    a. 10/2019 Echo: EF 55-60%, Gr2 DD. Nl RV.   Marland Kitchen Neuromuscular disorder ([place])   . Nonobstructive CAD (coronary artery disease)    a. 2012 Cath: mod, nonobs dzs; b. 10/2016 MV: EF 60%, no ischemia.  . OSA on CPAP 05/05/2017   Unattended Home Sleep Test 7/2/813-AHI 38.6/hour, desaturation to 64%, body weight 261 pounds  . PONV (postoperative nausea and vomiting)   . Sleep apnea     uses cpap asked to bring mask and tubing    Patient Active Problem List   Diagnosis Date Noted  . Generalized weakness 12/08/2020  . Dehydration with hyponatremia 12/08/2020  . Atrial fibrillation, chronic ([place]) 12/08/2020  . Elevated troponin level not due myocardial infarction 12/08/2020  . Presence of cardiac pacemaker 09/09/2020  . Coronary artery disease involving native heart without angina pectoris 07/10/2020  . Chronic kidney disease, stage 3a ([place]) 03/29/2020  . 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 Right epiretinal membrane 03/16/2020  . Posterior vitreous detachment of right eye 03/16/2020  . Aortic stenosis, moderate 03/12/2020  . 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 Near syncope 04/27/2018  . Hypoglycemia due to insulin 01/06/2018  . Essential hypertension 01/06/2018  . Diabetes mellitus type 2, with complication, on long term insulin pump ([place]) 01/06/2018  . Syncope and collapse 01/05/2018  . Encounter for therapeutic drug monitoring  06/29/2017  . OSA on CPAP 05/05/2017  . 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[place]) 01/05/2017  . Abnormal chest x-ray 10/12/2016  . 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 ANTERIOR FUSION CERVICAL SPINE  2012  . CARDIAC CATHETERIZATION  2011  . CARDIAC CATHETERIZATION N/A 11/09/2016   Procedure: Right Heart Cath;  Surgeon: Belva Crome, MD;  Location: [facility];  Service: Cardiovascular;  Laterality: N/A;  . carpel tunnel     left wrist  . CATARACT EXTRACTION    . CATARACT EXTRACTION W/ INTRAOCULAR LENS  IMPLANT, BILATERAL  2013  . CERVICAL LAMINECTOMY  2012  . COLONOSCOPY N/A 01/14/2013   Procedure: COLONOSCOPY;  Surgeon: Irene Shipper, MD;  Location: [facility];  Service: Endoscopy;  Laterality: N/A;  . CORONARY ARTERY BYPASS GRAFT  11/04/2020   LIMA-LAD, SVG-OM1, SVG-PDA (Dr Marney Setting [facility]) dc 11/18/2020  . EYE SURGERY    . [place]   left  . LEFT HEART CATH AND CORONARY ANGIOGRAPHY N/A 07/10/2020   Procedure: LEFT HEART CATH AND CORONARY ANGIOGRAPHY;  Surgeon: Sherren Mocha, MD;  Location: [facility];  Service: Cardiovascular;  Laterality: N/A;  . LUMBAR LAMINECTOMY  2003  . LUNG BIOPSY Left 12/26/2016   Procedure: LUNG BIOPSY;  Surgeon: [name], MD;  Location: [facility];  Service: Thoracic;  Laterality: Left;  . PACEMAKER IMPLANT N/A 03/30/2020   Procedure: PACEMAKER IMPLANT;  Surgeon: Evans Lance, MD;  Location: [facility];  Service: Cardiovascular;  Laterality: N/A;  . POSTERIOR FUSION CERVICAL SPINE  2012  . TRIGGER FINGER RELEASE  2011   4th finger left hand  . VIDEO ASSISTED THORACOSCOPY Left 12/26/2016   Procedure: VIDEO ASSISTED THORACOSCOPY;  Surgeon: [name], MD;  Location: [facility];  Service: Thoracic;  Laterality: Left;  Marland Kitchen VIDEO BRONCHOSCOPY N/A 12/26/2016   Procedure: VIDEO BRONCHOSCOPY;  Surgeon: [name], MD;  Location[facility];  Service:  Thoracic;  Laterality: N/A;       Family History  Problem Relation Age of Onset  . Diabetes Mellitus II Mother   . Emphysema Father 28  . Heart attack Father   . Colon cancer Neg Hx   . Esophageal cancer Neg Hx   . Rectal cancer Neg Hx   . Stomach cancer Neg Hx     Social History   Tobacco Use  . Smoking status: Never Smoker  . Smokeless tobacco: Never Used  Vaping Use  . Vaping Use: Never used  Substance Use Topics  . Alcohol use: No  . Drug use: No    Home Medications Prior to Admission medications   Medication Sig Start Date End Date Taking? Authorizing Provider  albuterol (VENTOLIN HFA) 108 (90 Base) MCG/ACT inhaler Inhale 2 puffs into the lungs every 4 (four) hours as needed for wheezing  or shortness of breath.   Yes [provider]  Ascorbic Acid (VITAMIN C WITH ROSE HIPS) 500 MG tablet Take 500 mg by mouth daily.   Yes [provider]  aspirin EC 81 MG tablet Take 81 mg by mouth daily.   Yes [provider]  atorvastatin (LIPITOR) 40 MG tablet Take 40 mg by mouth at bedtime. 11/16/20  Yes [provider]  clotrimazole-betamethasone (LOTRISONE) cream Apply 1 application topically daily as needed (for skin infection).  01/16/17  Yes [provider]  [employer] ACETAMINOPHEN 325 MG tablet Take 650 mg by mouth daily as needed for pain. 11/15/20  Yes [provider]  EPINEPHrine 0.3 mg/0.3 mL IJ SOAJ injection Inject 0.3 mg into the muscle once as needed for anaphylaxis (max 3 doses). 01/30/18  Yes [provider]  furosemide (LASIX) 40 MG tablet Take 40 mg by mouth daily. 02/15/18  Yes [provider]  insulin lispro (HUMALOG) 100 UNIT/ML injection Inject 22 Units into the skin 3 (three) times daily with meals.   Yes [provider]  liver oil-zinc oxide (DESITIN) 40 % ointment Apply 1 application topically in the morning and at bedtime.   Yes [provider]  LORazepam (ATIVAN) 1 MG tablet  Take 1 mg by mouth at bedtime as needed for sleep.  03/20/20  Yes [provider]  losartan (COZAAR) 50 MG tablet Take 50 mg by mouth daily.   Yes [provider]  magnesium oxide (MAG-OX) 400 MG tablet TAKE 1 CAPSULE BY MOUTH ONCE DAILY Patient taking differently: Take 400 mg by mouth daily. 08/11/20  Yes Brand Males, MD  metoprolol tartrate (LOPRESSOR) 50 MG tablet Take 25 mg by mouth 2 (two) times daily. 11/16/20  Yes [provider]  Multiple Vitamin (MULTIVITAMIN WITH MINERALS) TABS tablet Take 1 tablet by mouth daily.   Yes [provider]  pantoprazole (PROTONIX) 40 MG tablet Take 40 mg by mouth daily.   Yes [provider]  polyethylene glycol powder (GLYCOLAX/MIRALAX) 17 GM/SCOOP powder Take 17 g by mouth daily. 11/15/20  Yes [provider]  potassium chloride SA (KLOR-CON) 20 MEQ tablet Take 20-40 mEq by mouth See admin instructions. Takes 2 tablets (40 mEq totally) by mouth for 7 days; then takes 1 tablet (20 mEq totally) by mouth daily after that   Yes [provider]  TRESIBA FLEXTOUCH 200 UNIT/ML FlexTouch Pen Inject 34 Units into the skin at bedtime. 11/23/20  Yes [provider]  apixaban (ELIQUIS) 5 MG TABS tablet Take 1 tablet (5 mg total) by mouth 2 (two) times daily. 12/08/20   Evans Lance, MD  Continuous Blood Gluc Sensor (FREESTYLE LIBRE 14 DAY SENSOR) MISC 1 patch every 14 (fourteen) days.    [provider]    Allergies    Codeine, Ofev [nintedanib], and Other  Review of Systems   Review of Systems  Constitutional: Negative for chills and fever.  HENT: Negative for congestion and facial swelling.   Eyes: Negative for discharge and visual disturbance.  Respiratory: Positive for cough and shortness of breath.   Cardiovascular: Negative for chest pain and palpitations.  Gastrointestinal: Negative for abdominal pain, diarrhea and vomiting.  Musculoskeletal: Negative for arthralgias and  myalgias.  Skin: Negative for color change and rash.  Neurological: Positive for weakness. Negative for tremors, syncope and headaches.  Psychiatric/Behavioral: Negative for confusion and dysphoric mood.    Physical Exam Updated Vital Signs BP 126/60   Pulse 93   Temp 99.1  F (37.3 C) (Oral)   Resp (!) 23   Ht 6' (1.829 m)   Wt 112 kg   SpO2 97%   BMI 33.49 kg/m   Physical Exam Vitals and nursing note reviewed.  Constitutional:      Appearance: He is well-developed and well-nourished.  HENT:     Head: Normocephalic and atraumatic.  Eyes:     Extraocular Movements: EOM normal.     Pupils: Pupils are equal, round, and reactive to light.  Neck:     Vascular: No JVD.  Cardiovascular:     Rate and Rhythm: Tachycardia present. Rhythm irregularly irregular.     Heart sounds: No murmur heard. No friction rub. No gallop.   Pulmonary:     Effort: No respiratory distress.     Breath sounds: Examination of the right-lower field reveals rales. Examination of the left-lower field reveals rales. Rales present. No wheezing.  Abdominal:     General: There is no distension.     Tenderness: There is no guarding or rebound.  Musculoskeletal:        General: Normal range of motion.     Cervical back: Normal range of motion and neck supple.     Right lower leg: 2+ Edema present.     Left lower leg: 2+ Edema present.  Skin:    Coloration: Skin is not pale.     Findings: No rash.  Neurological:     Mental Status: He is alert and oriented to person, place, and time.  Psychiatric:        Mood and Affect: Mood and affect normal.        Behavior: Behavior normal.     ED Results / Procedures / Treatments   Labs (all labs ordered are listed, but only abnormal results are displayed) Labs Reviewed  CBC WITH DIFFERENTIAL/PLATELET - Abnormal; Notable for the following components:      Result Value   RBC 3.77 (*)    Hemoglobin 11.9 (*)    HCT 35.8 (*)    Monocytes Absolute 1.6 (*)     All other components within normal limits  COMPREHENSIVE METABOLIC PANEL - Abnormal; Notable for the following components:   Sodium 131 (*)    Chloride 92 (*)    Glucose, Bld 143 (*)    BUN 37 (*)    Creatinine, Ser 1.58 (*)    Albumin 3.0 (*)    GFR, Estimated 44 (*)    All other components within normal limits  BRAIN NATRIURETIC PEPTIDE - Abnormal; Notable for the following components:   B Natriuretic Peptide 195.4 (*)    All other components within normal limits  URINALYSIS, ROUTINE W REFLEX MICROSCOPIC - Abnormal; Notable for the following components:   Protein, ur 100 (*)    All other components within normal limits  LACTIC ACID, PLASMA - Abnormal; Notable for the following components:   Lactic Acid, Venous 2.1 (*)    All other components within normal limits  TROPONIN I (HIGH SENSITIVITY) - Abnormal; Notable for the following components:   Troponin I (High Sensitivity) 27 (*)    All other components within normal limits  TROPONIN I (HIGH SENSITIVITY) - Abnormal; Notable for the following components:   Troponin I (High Sensitivity) 30 (*)    All other components within normal limits  RESP PANEL BY RT-PCR (FLU A&B, COVID) ARPGX2  CULTURE, BLOOD (ROUTINE X 2)  CULTURE, BLOOD (ROUTINE X 2)  LACTIC ACID, PLASMA  MAGNESIUM  TSH  HEMOGLOBIN A1C  COMPREHENSIVE METABOLIC PANEL  CBC WITH DIFFERENTIAL/PLATELET  MAGNESIUM  C-REACTIVE PROTEIN  CBG MONITORING, ED    EKG EKG Interpretation  Date/Time:  Tuesday December 08 2020 17:52:00 EST Ventricular Rate:  94 PR Interval:    QRS Duration: 154 QT Interval:  370 QTC Calculation: 463 R Axis:   -77 Text Interpretation: atrial fibrillation Right bundle branch block Probable inferior infarct, age indeterminate Otherwise no significant change Confirmed by Deno Etienne [phone]) on 12/08/2020 6:03:56 PM   Radiology CT CHEST WO CONTRAST  Result Date: 12/08/2020 CLINICAL DATA:  80 year old male with idiopathic pulmonary fibrosis.  Concern for pneumonia or abscess. EXAM: CT CHEST WITHOUT CONTRAST TECHNIQUE: Multidetector CT imaging of the chest was performed following the standard protocol without IV contrast. COMPARISON:  Chest CT dated 09/30/2019. FINDINGS: Evaluation of this exam is limited in the absence of intravenous contrast. Cardiovascular: There is mild cardiomegaly. No significant pericardial effusion. Three-vessel coronary vascular calcification and postsurgical changes of CABG. Left pectoral pacemaker device. There is mild atherosclerotic calcification of the thoracic aorta. There is dilatation of the main pulmonary trunk suggestive of a degree of pulmonary hypertension. Clinical correlation is recommended Mediastinum/Nodes: No hilar or mediastinal adenopathy. The esophagus is grossly unremarkable. No mediastinal fluid collection. Lungs/Pleura: Diffuse bilateral interstitial coarsening as seen previously in keeping with history of pulmonary fibrosis. No new consolidation. There is no pleural effusion pneumothorax. The central airways are patent. Upper Abdomen: Slight irregularity of the liver contour may represent early changes of cirrhosis. Musculoskeletal: Median sternotomy wires. Degenerative changes of the spine. No acute osseous pathology. Partially visualized cervical fusion. IMPRESSION: 1. No acute intrathoracic pathology. 2. Pulmonary fibrosis. 3. Mild cardiomegaly with three-vessel coronary vascular calcification and postsurgical changes of CABG. 4. Aortic Atherosclerosis (ICD10-I70.0). Electronically Signed   By: Anner Crete M.D.   On: 12/08/2020 22:39   DG Chest Port 1 View  Result Date: 12/08/2020 CLINICAL DATA:  Shortness of breath EXAM: PORTABLE CHEST 1 VIEW COMPARISON:  07/17/2020, CT 09/30/2019, 03/31/2020, 04/27/2018 FINDINGS: Surgical hardware in the cervical spine. Left-sided pacing device as before. Interval post sternotomy changes. Diffuse bilateral left greater than right reticular and ground-glass  opacity, likely corresponding to history of pulmonary fibrosis. Chronic pleural scarring on the left. No acute airspace consolidation or pneumothorax. Stable cardiomediastinal silhouette. IMPRESSION: 1. Stable radiographic appearance of the chest with pulmonary fibrosis. No acute airspace disease. Electronically Signed   By: Donavan Foil M.D.   On: 12/08/2020 19:05   CUP PACEART INCLINIC DEVICE CHECK  Result Date: 12/08/2020 Pacemaker check in clinic. Normal device function. Thresholds, sensing, impedances consistent with previous measurements. Device programmed to maximize longevity. AT/AF burden 24%, appear AF w/ controlled VR. No OAC on file. Patient will start Eliquis and come back 01/06/21 to attempt in office ATP. No high ventricular rates noted. Device programmed at appropriate safety margins. Histogram distribution appropriate for patient activity level. Device programmed to optimize intrinsic conduction. Estimated longevity 8 years 11 months. Patient enrolled in remote follow-up. Patient education completed. ROV w/ Dr. Lovena Le 01/06/21.Lavenia Atlas, BSN, RN   Procedures Procedures (including critical care time)  Medications Ordered in ED Medications  ondansetron [facility]) injection 4 mg (4 mg Intravenous Given 12/08/20 2046)  aspirin EC tablet 81 mg (has no administration in time range)  metoprolol tartrate (LOPRESSOR) tablet 50 mg (50 mg Oral Given 12/08/20 2249)  insulin detemir (LEVEMIR) injection 20 Units (20 Units Subcutaneous Given 12/08/20 2300)  insulin aspart (novoLOG) injection 10 Units (has no administration in time range)  pantoprazole (PROTONIX)  EC tablet 40 mg (has no administration in time range)  apixaban (ELIQUIS) tablet 5 mg (5 mg Oral Given 12/08/20 2249)  albuterol (VENTOLIN HFA) 108 (90 Base) MCG/ACT inhaler 2 puff (has no administration in time range)  ketotifen (ZADITOR) 0.025 % ophthalmic solution 1 drop (has no administration in time range)  acetaminophen (TYLENOL) tablet  650 mg (has no administration in time range)  polyethylene glycol (MIRALAX / GLYCOLAX) packet 17 g (has no administration in time range)  insulin aspart (novoLOG) injection 0-15 Units (0 Units Subcutaneous Not Given 12/08/20 2256)  sodium chloride 0.9 % bolus 250 mL (250 mLs Intravenous New Bag/Given 12/08/20 2256)    Followed by  0.9 %  sodium chloride infusion (has no administration in time range)  atorvastatin (LIPITOR) tablet 40 mg (has no administration in time range)  hydrALAZINE (APRESOLINE) injection 10 mg (has no administration in time range)  prochlorperazine (COMPAZINE) injection 10 mg (has no administration in time range)    ED Course  I have reviewed the triage vital signs and the nursing notes.  Pertinent labs & imaging results that were available during my care of the patient were reviewed by me and considered in my medical decision making (see chart for details).    MDM Rules/Calculators/A&P                          80 yo M with a chief complaints of generalized fatigue.  Patient just had three-way bypass surgery done at [facility].  Has been fatigued but worsening over the past 24 to 48 hours.  Went to his outpatient cardiology appointment today and was found to be in atrial fibrillation.  Thought to be contributing to his increased fatigue post procedure.  The patient unfortunately when he got home was so fatigued that he was even unable to get up and stand.  Unable to even sit up in bed on his own here.  A. fib with a controlled rate here.  Lab work with mild drop in his baseline hemoglobin, no significant electrolyte abnormality.  Chest x-ray with not a significant amount of fluid overload.  Rales at the bases for me some lower extremity edema.  I discussed the case with Dr. Meda Coffee, cardiology recommended medical admission and cardiology will follow on consult.  The patients results and plan were reviewed and discussed.   Any x-rays performed were independently reviewed by myself.    Differential diagnosis were considered with the presenting HPI.  Medications  ondansetron (ZOFRAN) injection 4 mg (4 mg Intravenous Given 12/08/20 2046)  aspirin EC tablet 81 mg (has no administration in time range)  metoprolol tartrate (LOPRESSOR) tablet 50 mg (50 mg Oral Given 12/08/20 2249)  insulin detemir (LEVEMIR) injection 20 Units (20 Units Subcutaneous Given 12/08/20 2300)  insulin aspart (novoLOG) injection 10 Units (has no administration in time range)  pantoprazole (PROTONIX) EC tablet 40 mg (has no administration in time range)  apixaban (ELIQUIS) tablet 5 mg (5 mg Oral Given 12/08/20 2249)  albuterol (VENTOLIN HFA) 108 (90 Base) MCG/ACT inhaler 2 puff (has no administration in time range)  ketotifen (ZADITOR) 0.025 % ophthalmic solution 1 drop (has no administration in time range)  acetaminophen (TYLENOL) tablet 650 mg (has no administration in time range)  polyethylene glycol (MIRALAX / GLYCOLAX) packet 17 g (has no administration in time range)  insulin aspart (novoLOG) injection 0-15 Units (0 Units Subcutaneous Not Given 12/08/20 2256)  sodium chloride 0.9 % bolus 250 mL (250  mLs Intravenous New Bag/Given 12/08/20 2256)    Followed by  0.9 %  sodium chloride infusion (has no administration in time range)  atorvastatin (LIPITOR) tablet 40 mg (has no administration in time range)  hydrALAZINE (APRESOLINE) injection 10 mg (has no administration in time range)  prochlorperazine (COMPAZINE) injection 10 mg (has no administration in time range)    Vitals:   12/08/20 1830 12/08/20 1845 12/08/20 1900 12/08/20 2000  BP: (!) 115/40 (!) 128/51 (!) 117/51 126/60  Pulse: 76 85 75 93  Resp: (!) 23 (!) 24 (!) 25 (!) 23  Temp:      TempSrc:      SpO2: 97% 100% 96% 97%  Weight:      Height:        Final diagnoses:  Paroxysmal atrial fibrillation (HCC)    Admission/ observation were discussed with the admitting physician, patient and/or family and they are comfortable with the plan.    Final Clinical Impression(s) / ED Diagnoses Final diagnoses:  Paroxysmal atrial fibrillation [facility])    Rx / DC Orders ED Discharge Orders    None       Deno Etienne, DO 12/08/20 2325

## 2020-12-08 NOTE — ED Triage Notes (Signed)
Pt to ED today from home. Pt had CABG x68month. EMS reports pt has been healing/recovering well until today. This morning pt reports he woke up with severe gen weakness, SOB, cough, body aches, and chills.  Pt reports sores on sacrum   EMS gave 4mg  of zofran and 450 fluids. PIV 20 in left hand

## 2020-12-08 NOTE — H&P (Signed)
History and Physical    Shane [name] QIH:[id number] DOB: 01-04-1941 DOA: 12/08/2020  PCP: Burnard Bunting, MD  Patient coming from: Home   Chief Complaint:  Chief Complaint  Patient presents with  . Weakness  . Shortness of Breath  . Cough     HPI:    80 year old male with past medical history of coronary artery disease (S/P 3-vessel CABG at [facility] 12/1), secondary heart block status post pacemaker placement 03/2020 chronic kidney disease stage IIIa, sleep apnea, hypertension, moderate aortic stenosis, idiopathic pulmonary fibrosis who presents to [facility] with complaints of weakness and chills.  Patient explains that he has been slowly recovering since his discharge from [facility] on 12/15 after undergoing a three-vessel bypass on 12/1.  In approximately the past week or so, patient is noted that he has been experiencing increasing fatigue however.  Initially some fatigue was extremely mild but over the neck several days became more and more severe.  Patient does complain of some associated shortness of breath but he is uncertain as to whether or not this component of his symptoms is any worse than his baseline.  In the past 24 hours the patient has begun to experience chills, low-grade temperatures at home (in the 63F range), and frequent cough that kept him up through the evening that is intermittently productive with white sputum.  Patient denies sick contacts, recent travel or confirmed contact with COVID-19 infection.  Upon further questioning patient denies dysuria, diarrhea, abdominal pain nausea or vomiting.  Of note, review of outpatient notes reveals that patient has been exhibiting bouts of atrial tachycardia and atrial fibrillation since approximately 12/27.  Device clinic note from earlier today mentions that patient is to be initiated on Eliquis however patient has yet to begin taking this medication.  Today, patient explains that he has  developed such severe weakness that he is barely able to rise from a seated position.  This is what prompted him to present to [facility] emergency room for evaluation.  Upon evaluation in the emergency department, patient has been found to be in controlled atrial fibrillation and has been found to be normotensive.  Case has been discussed with Dr. Meda Coffee with cardiology who recommends hospitalization, initiation of anticoagulation for 72 hours followed by cardioversion.  Meantime, she requests that the hospital service work patient up for any potential underlying infection.  The hospitalist group has been called to assess the patient for admission of hospital.  Review of Systems:   Review of Systems  Constitutional: Positive for chills, fever and malaise/fatigue.  Respiratory: Positive for cough and shortness of breath.   Neurological: Positive for weakness.  All other systems reviewed and are negative.   Past Medical History:  Diagnosis Date  . 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 Cataract   . Chronic kidney disease    d/t DM  . CKD (chronic kidney disease), stage III ([place])   . Diabetes mellitus    Vgo disposal insulin bolus  simular to insulin pump  . Dyspnea   . GERD (gastroesophageal reflux disease)   . Hyperlipidemia   . Hypertension   . Idiopathic pulmonary fibrosis ([place]) 11/2016  . ILD (interstitial lung disease) ([place])   . Moderate aortic stenosis    a. 10/2019 Echo: EF 55-60%, Gr2 DD. Nl RV.   Marland Kitchen Neuromuscular disorder ([place])   . Nonobstructive CAD (coronary artery disease)    a. 2012 Cath: mod, nonobs dzs; b. 10/2016 MV: EF 60%, no  ischemia.  . OSA on CPAP 05/05/2017   Unattended Home Sleep Test 7/2/813-AHI 38.6/hour, desaturation to 64%, body weight 261 pounds  . PONV (postoperative nausea and vomiting)   . Sleep apnea     uses cpap asked to bring mask and tubing    Past Surgical History:  Procedure Laterality Date  . ANTERIOR FUSION CERVICAL SPINE  2012  . CARDIAC  CATHETERIZATION  2011  . CARDIAC CATHETERIZATION N/A 11/09/2016   Procedure: Right Heart Cath;  Surgeon: Belva Crome, MD;  Location: [place] [facility];  Service: Cardiovascular;  Laterality: N/A;  . carpel tunnel     left wrist  . CATARACT EXTRACTION    . CATARACT EXTRACTION W/ INTRAOCULAR LENS  IMPLANT, BILATERAL  2013  . CERVICAL LAMINECTOMY  2012  . COLONOSCOPY N/A 01/14/2013   Procedure: COLONOSCOPY;  Surgeon: Irene Shipper, MD;  Location: [facility];  Service: Endoscopy;  Laterality: N/A;  . CORONARY ARTERY BYPASS GRAFT  11/04/2020   LIMA-LAD, SVG-OM1, SVG-PDA (Dr Marney [facility]) dc 11/18/2020  . EYE SURGERY    . [place]   left  . LEFT HEART CATH AND CORONARY ANGIOGRAPHY N/A 07/10/2020   Procedure: LEFT HEART CATH AND CORONARY ANGIOGRAPHY;  Surgeon: Sherren Mocha, MD;  Location: [place] [facility];  Service: Cardiovascular;  Laterality: N/A;  . LUMBAR LAMINECTOMY  2003  . LUNG BIOPSY Left 12/26/2016   Procedure: LUNG BIOPSY;  Surgeon: [name], MD;  Location: [place];  Service: Thoracic;  Laterality: Left;  . PACEMAKER IMPLANT N/A 03/30/2020   Procedure: PACEMAKER IMPLANT;  Surgeon: Evans Lance, MD;  Location: [place] [facility];  Service: Cardiovascular;  Laterality: N/A;  . POSTERIOR FUSION CERVICAL SPINE  2012  . TRIGGER FINGER RELEASE  2011   4th finger left hand  . VIDEO ASSISTED THORACOSCOPY Left 12/26/2016   Procedure: VIDEO ASSISTED THORACOSCOPY;  Surgeon: [name], MD;  Location: [place];  Service: Thoracic;  Laterality: Left;  Marland Kitchen VIDEO BRONCHOSCOPY N/A 12/26/2016   Procedure: VIDEO BRONCHOSCOPY;  Surgeon: [name], MD;  Location: [place];  Service: Thoracic;  Laterality: N/A;     reports that he has never smoked. He has never used smokeless tobacco. He reports that he does not drink alcohol and does not use drugs.  Allergies  Allergen Reactions  . Codeine Hives and Itching  . Ofev [Nintedanib] Diarrhea    SEVERE DIARRHEA   . Other Diarrhea and Other (See Comments)    Esbriet causes elevated LFTs. D/C on 06/14/17 and SEVERE DIARRHEA    Family History  Problem Relation Age of Onset  . Diabetes Mellitus II Mother   . Emphysema Father 35  . Heart attack Father   . Colon cancer Neg Hx   . Esophageal cancer Neg Hx   . Rectal cancer Neg Hx   . Stomach cancer Neg Hx      Prior to Admission medications   Medication Sig Start Date End Date Taking? Authorizing Provider  albuterol (VENTOLIN HFA) 108 (90 Base) MCG/ACT inhaler Inhale 2 puffs into the lungs every 4 (four) hours as needed for wheezing or shortness of breath.   Yes [provider]  Ascorbic Acid (VITAMIN C WITH ROSE HIPS) 500 MG tablet Take 500 mg by mouth daily.   Yes [provider]  aspirin EC 81 MG tablet Take 81 mg by mouth daily.   Yes [provider]  atorvastatin (LIPITOR) 40 MG tablet Take 40 mg  by mouth at bedtime. 11/16/20  Yes [provider]  clotrimazole-betamethasone (LOTRISONE) cream Apply 1 application topically daily as needed (for skin infection).  01/16/17  Yes [provider]  [employer] ACETAMINOPHEN 325 MG tablet Take 650 mg by mouth daily as needed for pain. 11/15/20  Yes [provider]  EPINEPHrine 0.3 mg/0.3 mL IJ SOAJ injection Inject 0.3 mg into the muscle once as needed for anaphylaxis (max 3 doses). 01/30/18  Yes [provider]  furosemide (LASIX) 40 MG tablet Take 40 mg by mouth daily. 02/15/18  Yes [provider]  insulin lispro (HUMALOG) 100 UNIT/ML injection Inject 20-34 Units into the skin See admin instructions. Injects 20 units under the skin three times daily before meals; Injects 34 units under the skin at bed time   Yes [provider]  isosorbide mononitrate (IMDUR) 30 MG 24 hr tablet Take 1 tablet (30 mg total) by mouth daily. 09/09/20 12/08/20 Yes Kilroy, Luke K, PA-C  LORazepam (ATIVAN) 1 MG tablet Take 1 mg by mouth at bedtime as needed for  sleep.  03/20/20  Yes [provider]  losartan (COZAAR) 50 MG tablet Take 50 mg by mouth daily.   Yes [provider]  magnesium oxide (MAG-OX) 400 MG tablet TAKE 1 CAPSULE BY MOUTH ONCE DAILY 08/11/20  Yes Brand Males, MD  metoprolol tartrate (LOPRESSOR) 50 MG tablet Take 25 mg by mouth 2 (two) times daily. 11/16/20  Yes [provider]  Multiple Vitamin (MULTIVITAMIN WITH MINERALS) TABS tablet Take 1 tablet by mouth daily.   Yes [provider]  pantoprazole (PROTONIX) 40 MG tablet Take 40 mg by mouth daily.   Yes [provider]  potassium chloride SA (KLOR-CON) 20 MEQ tablet Take 20 mEq by mouth daily. 40-1week 20 qd 12/30   Yes [provider]  apixaban (ELIQUIS) 5 MG TABS tablet Take 1 tablet (5 mg total) by mouth 2 (two) times daily. 12/08/20   Evans Lance, MD  Continuous Blood Gluc Sensor (FREESTYLE LIBRE 14 DAY SENSOR) MISC 1 patch every 14 (fourteen) days.    [provider]    Physical Exam: Vitals:   12/08/20 1830 12/08/20 1845 12/08/20 1900 12/08/20 2000  BP: (!) 115/40 (!) 128/51 (!) 117/51 126/60  Pulse: 76 85 75 93  Resp: (!) 23 (!) 24 (!) 25 (!) 23  Temp:      TempSrc:      SpO2: 97% 100% 96% 97%  Weight:      Height:        Constitutional: Patient is lethargic but arousable and oriented x3, no associated distress.   Skin: no rashes, no lesions, poor skin turgor noted. Eyes: Pupils are equally reactive to light.  No evidence of scleral icterus or conjunctival pallor.  ENMT: Somewhat dry mucous membranes noted.  Posterior pharynx clear of any exudate or lesions.   Neck: normal, supple, no masses, no thyromegaly.  No evidence of jugular venous distension.   Respiratory: Significant bilateral lower and mid field rales.  No evidence of wheezing.  Patient is quite tachypneic without evidence of accessory muscle use.  Cardiovascular: Regular rate and rhythm, 3/6 systolic murmur noted notable pitting edema  of the distal bilateral lower extremities.  2+ pedal pulses. No carotid bruits.  Chest:   Nontender without crepitus or deformity.   Back:   Nontender without crepitus or deformity. Abdomen: Notable lower abdominal tenderness.  Abdomen is soft however.  No evidence of intra-abdominal masses.  Positive bowel sounds noted  in all quadrants.   Musculoskeletal: No joint deformity upper and lower extremities. Good ROM, no contractures. Normal muscle tone.  Neurologic: CN 2-12 grossly intact. Sensation intact.  Patient moving all 4 extremities spontaneously.  Patient is following all commands.  Patient is responsive to verbal stimuli.   Psychiatric: Patient exhibits normal mood with appropriate affect.  Patient seems to possess insight as to their current situation.     Labs on Admission: I have personally reviewed following labs and imaging studies -   CBC: Recent Labs  Lab 12/08/20 1805  WBC 10.2  NEUTROABS 7.3  HGB 11.9*  HCT 35.8*  MCV 95.0  PLT 295   Basic Metabolic Panel: Recent Labs  Lab 12/08/20 1805  NA 131*  K 4.5  CL 92*  CO2 26  GLUCOSE 143*  BUN 37*  CREATININE 1.58*  CALCIUM 9.3   GFR: Estimated Creatinine Clearance: 49 mL/min (A) (by C-G formula based on SCr of 1.58 mg/dL (H)). Liver Function Tests: Recent Labs  Lab 12/08/20 1805  AST 20  ALT 19  ALKPHOS 90  BILITOT 1.2  PROT 7.3  ALBUMIN 3.0*   No results for input(s): LIPASE, AMYLASE in the last 168 hours. No results for input(s): AMMONIA in the last 168 hours. Coagulation Profile: No results for input(s): INR, PROTIME in the last 168 hours. Cardiac Enzymes: No results for input(s): CKTOTAL, CKMB, CKMBINDEX, TROPONINI in the last 168 hours. BNP (last 3 results) No results for input(s): PROBNP in the last 8760 hours. HbA1C: No results for input(s): HGBA1C in the last 72 hours. CBG: No results for input(s): GLUCAP in the last 168 hours. Lipid Profile: No results for input(s): CHOL, HDL, LDLCALC,  TRIG, CHOLHDL, LDLDIRECT in the last 72 hours. Thyroid Function Tests: No results for input(s): TSH, T4TOTAL, FREET4, T3FREE, THYROIDAB in the last 72 hours. Anemia Panel: No results for input(s): VITAMINB12, FOLATE, FERRITIN, TIBC, IRON, RETICCTPCT in the last 72 hours. Urine analysis:    Component Value Date/Time   COLORURINE YELLOW 12/08/2020 1954   APPEARANCEUR CLEAR 12/08/2020 1954   LABSPEC 1.014 12/08/2020 1954   PHURINE 5.0 12/08/2020 1954   GLUCOSEU NEGATIVE 12/08/2020 1954   HGBUR NEGATIVE 12/08/2020 [place] NEGATIVE 12/08/2020 [place] NEGATIVE 12/08/2020 1954   PROTEINUR 100 (A) 12/08/2020 1954   UROBILINOGEN 1.0 09/27/2011 1112   NITRITE NEGATIVE 12/08/2020 1954   LEUKOCYTESUR NEGATIVE 12/08/2020 1954    Radiological Exams on Admission - Personally Reviewed: DG Chest Port 1 View  Result Date: 12/08/2020 CLINICAL DATA:  Shortness of breath EXAM: PORTABLE CHEST 1 VIEW COMPARISON:  07/17/2020, CT 09/30/2019, 03/31/2020, 04/27/2018 FINDINGS: Surgical hardware in the cervical spine. Left-sided pacing device as before. Interval post sternotomy changes. Diffuse bilateral left greater than right reticular and ground-glass opacity, likely corresponding to history of pulmonary fibrosis. Chronic pleural scarring on the left. No acute airspace consolidation or pneumothorax. Stable cardiomediastinal silhouette. IMPRESSION: 1. Stable radiographic appearance of the chest with pulmonary fibrosis. No acute airspace disease. Electronically Signed   By: Donavan Foil M.D.   On: 12/08/2020 19:05   CUP PACEART INCLINIC DEVICE CHECK  Result Date: 12/08/2020 Pacemaker check in clinic. Normal device function. Thresholds, sensing, impedances consistent with previous measurements. Device programmed to maximize longevity. AT/AF burden 24%, appear AF w/ controlled VR. No OAC on file. Patient will start Eliquis and come back 01/06/21 to attempt in office ATP. No high ventricular rates  noted. Device programmed at appropriate safety margins. Histogram distribution appropriate for patient  activity level. Device programmed to optimize intrinsic conduction. Estimated longevity 8 years 11 months. Patient enrolled in remote follow-up. Patient education completed. ROV w/ Dr. Lovena Le 01/06/21.Lavenia Atlas, BSN, RN   EKG: Personally reviewed.  Rhythm is atrial fibrillation with heart rate of 94 bpm.  Evidence of right bundle branch block no dynamic ST segment changes appreciated.  Assessment/Plan Principal Problem:   Generalized weakness   Patient presenting with approximately 1 week of progressively worsening weakness and malaise with 1 day history of cough and chills  Patient additionally identified to be in new onset atrial fibrillation for approximately the past week as well based on device interrogation as noted in the device clinic notes  To complicate matters further, patient is clinically volume depleted with dry mucous membranes and poor skin turgor with concurrent hyponatremia and hypochloremia on chemistry.  This is likely secondary to having taken an increased dose of Lasix for the past several weeks since his discharge at [facility].   At this point, I feel patient's generalized weakness is likely multifactorial secondary to new onset atrial fibrillation and volume depletion (despite mild peripheral edema on exam).  Temporarily holding home regimen of diuretics, providing patient with gentle intravenous hydration starting with 250 cc normal saline bolus followed by 100 cc an hour of normal saline for an additional 1 L.  Monitoring closely for evidence of volume overload.  Concurrently evaluated patient for any evidence of infection urinalysis and chest imaging.  Evaluation of chest x-ray is of limited value due to underlying idiopathic pulmonary fibrosis -we may have to resort to a noncontrast CT of the chest.  Furthermore, I discussed the patient's case at length with Dr. Meda Coffee  with cardiology.  She is recommending initiation of Eliquis with likely cardioversion to be performed on Thursday in case presence of atrial fibrillation is contributing to patient's weakness.  PT evaluation ordered  Active Problems: Paroxysmal Atrial Fibrillation     As mentioned above, patient was identified to be going in and out of atrial fibrillation based on device interrogation based on latest device clinic notes  His new onset atrial fibrillation may very well be contributing to patient's generalized weakness  Per my discussion with Dr. Meda Coffee cardiology, will continue patient on metoprolol tartrate 50 mg twice daily  Initiating Eliquis 5 mg twice daily per cardiology recommendation  Patient to undergo cardioversion during this hospitalization according to Dr. Meda Coffee, likely on Thursday.  Monitoring patient on telemetry  Patient currently rate controlled  Coronary artery disease involving native heart without angina pectoris   Patient is currently chest pain-free with the exception of some very mild reproducible musculoskeletal pain upon palpation of the CABG incision  Continuing home regimen of metoprolol  Continue home regimen of Lipitor  Monitoring patient on telemetry  Elevated troponin not due to myocardial infarction   Slightly elevated troponin in the presence of atypical chest discomfort  Unlikely to be plaque rupture  Continuing to cycle cardiac enzymes  Monitoring patient on telemetry    Dehydration with hyponatremia   As mentioned above, gentle intravenous hydration  Please see remainder of assessment and plan above    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HCC)   Longstanding known history of idiopathic pulmonary fibrosis complicates interpretation of chest x-ray  Will likely need to rely on CT imaging of the chest to ensure patient does not have an underlying pneumonia in the setting of a 24-hour history of severe cough    OSA on  CPAP   CPAP nightly  Essential hypertension  . Resume patients home regimen or oral antihypertensives . Titrate antihypertensive regimen as necessary to achieve adequate BP control . PRN intravenous antihypertensives for excessively elevated blood pressure    Chronic kidney disease, stage 3a (HCC)  Elevation in BUN and creatinine suggestive of mild renal insufficiency without renal injury  Hydrating patient gently with intravenous isotonic fluids as mentioned above Strict intake and output monitoring Minimizing nephrotoxic agents as much as possible Serial chemistries to monitor renal function and electrolytes    Code Status:  Full code Family Communication: Spouse is at bedside who has been updated on plan of care  Status is: Observation  The patient remains OBS appropriate and will d/c before 2 midnights.  Dispo: The patient is from: Home              Anticipated d/c is to: Home              Anticipated d/c date is: 2 days              Patient currently is not medically stable to d/c.        Vernelle Emerald MD [facility] Pager [phone]  If 7PM-7AM, please contact night-coverage [email or web address] Use universal  password for that web site. If you do not have the password, please call the hospital operator.  12/08/2020, 9:31 PM

## 2020-12-08 NOTE — ED Notes (Signed)
Bladder scan 25mL

## 2020-12-08 NOTE — ED Notes (Signed)
Hospitalist at bedside, this RN aided pt in repositioning in bed. Pt denies needs or wants at this time.

## 2020-12-08 NOTE — Telephone Encounter (Signed)
Per DR. Taylor-order placed for cardiac rehab s/p cabg x 3.

## 2020-12-08 NOTE — Progress Notes (Signed)
Pacemaker check in clinic. Normal device function. Thresholds, sensing, impedances consistent with previous measurements. Device programmed to maximize longevity. AT/AF burden 24%, appear AF w/ controlled VR. No OAC on file. Patient will start Eliquis and come back 01/06/21 to attempt in office ATP. No high ventricular rates noted. Device programmed at appropriate safety margins. Histogram distribution appropriate for patient activity level. Device programmed to optimize intrinsic conduction. Estimated longevity 8 years 11 months. Patient enrolled in remote follow-up. Patient education completed. ROV w/ Dr. Lovena Le 01/06/21.

## 2020-12-09 ENCOUNTER — Observation Stay (HOSPITAL_COMMUNITY): Payer: Medicare Other

## 2020-12-09 ENCOUNTER — Observation Stay (HOSPITAL_BASED_OUTPATIENT_CLINIC_OR_DEPARTMENT_OTHER): Payer: Medicare Other

## 2020-12-09 DIAGNOSIS — L97509 Non-pressure chronic ulcer of other part of unspecified foot with unspecified severity: Secondary | ICD-10-CM

## 2020-12-09 DIAGNOSIS — I350 Nonrheumatic aortic (valve) stenosis: Secondary | ICD-10-CM | POA: Diagnosis not present

## 2020-12-09 DIAGNOSIS — N1831 Chronic kidney disease, stage 3a: Secondary | ICD-10-CM | POA: Diagnosis present

## 2020-12-09 DIAGNOSIS — Z959 Presence of cardiac and vascular implant and graft, unspecified: Secondary | ICD-10-CM | POA: Diagnosis not present

## 2020-12-09 DIAGNOSIS — E871 Hypo-osmolality and hyponatremia: Secondary | ICD-10-CM | POA: Diagnosis not present

## 2020-12-09 DIAGNOSIS — L0390 Cellulitis, unspecified: Secondary | ICD-10-CM

## 2020-12-09 DIAGNOSIS — Z951 Presence of aortocoronary bypass graft: Secondary | ICD-10-CM | POA: Diagnosis not present

## 2020-12-09 DIAGNOSIS — M21371 Foot drop, right foot: Secondary | ICD-10-CM

## 2020-12-09 DIAGNOSIS — I471 Supraventricular tachycardia: Secondary | ICD-10-CM | POA: Diagnosis present

## 2020-12-09 DIAGNOSIS — I330 Acute and subacute infective endocarditis: Secondary | ICD-10-CM | POA: Diagnosis present

## 2020-12-09 DIAGNOSIS — B9561 Methicillin susceptible Staphylococcus aureus infection as the cause of diseases classified elsewhere: Secondary | ICD-10-CM | POA: Diagnosis present

## 2020-12-09 DIAGNOSIS — T827XXA Infection and inflammatory reaction due to other cardiac and vascular devices, implants and grafts, initial encounter: Secondary | ICD-10-CM | POA: Diagnosis present

## 2020-12-09 DIAGNOSIS — Y831 Surgical operation with implant of artificial internal device as the cause of abnormal reaction of the patient, or of later complication, without mention of misadventure at the time of the procedure: Secondary | ICD-10-CM | POA: Diagnosis present

## 2020-12-09 DIAGNOSIS — I4892 Unspecified atrial flutter: Secondary | ICD-10-CM | POA: Diagnosis present

## 2020-12-09 DIAGNOSIS — J84112 Idiopathic pulmonary fibrosis: Secondary | ICD-10-CM | POA: Diagnosis present

## 2020-12-09 DIAGNOSIS — E222 Syndrome of inappropriate secretion of antidiuretic hormone: Secondary | ICD-10-CM | POA: Diagnosis present

## 2020-12-09 DIAGNOSIS — R531 Weakness: Secondary | ICD-10-CM

## 2020-12-09 DIAGNOSIS — Z950 Presence of cardiac pacemaker: Secondary | ICD-10-CM

## 2020-12-09 DIAGNOSIS — L02611 Cutaneous abscess of right foot: Secondary | ICD-10-CM | POA: Diagnosis present

## 2020-12-09 DIAGNOSIS — E1152 Type 2 diabetes mellitus with diabetic peripheral angiopathy with gangrene: Secondary | ICD-10-CM | POA: Diagnosis present

## 2020-12-09 DIAGNOSIS — R7881 Bacteremia: Secondary | ICD-10-CM | POA: Diagnosis present

## 2020-12-09 DIAGNOSIS — I480 Paroxysmal atrial fibrillation: Secondary | ICD-10-CM | POA: Diagnosis present

## 2020-12-09 DIAGNOSIS — G4733 Obstructive sleep apnea (adult) (pediatric): Secondary | ICD-10-CM | POA: Diagnosis present

## 2020-12-09 DIAGNOSIS — I459 Conduction disorder, unspecified: Secondary | ICD-10-CM | POA: Diagnosis present

## 2020-12-09 DIAGNOSIS — Z9989 Dependence on other enabling machines and devices: Secondary | ICD-10-CM

## 2020-12-09 DIAGNOSIS — I2510 Atherosclerotic heart disease of native coronary artery without angina pectoris: Secondary | ICD-10-CM | POA: Diagnosis present

## 2020-12-09 DIAGNOSIS — Z20822 Contact with and (suspected) exposure to covid-19: Secondary | ICD-10-CM | POA: Diagnosis present

## 2020-12-09 DIAGNOSIS — E860 Dehydration: Secondary | ICD-10-CM | POA: Diagnosis present

## 2020-12-09 DIAGNOSIS — L89323 Pressure ulcer of left buttock, stage 3: Secondary | ICD-10-CM | POA: Diagnosis present

## 2020-12-09 DIAGNOSIS — E1122 Type 2 diabetes mellitus with diabetic chronic kidney disease: Secondary | ICD-10-CM | POA: Diagnosis present

## 2020-12-09 DIAGNOSIS — L97514 Non-pressure chronic ulcer of other part of right foot with necrosis of bone: Secondary | ICD-10-CM | POA: Insufficient documentation

## 2020-12-09 DIAGNOSIS — I482 Chronic atrial fibrillation, unspecified: Secondary | ICD-10-CM | POA: Diagnosis not present

## 2020-12-09 DIAGNOSIS — R059 Cough, unspecified: Secondary | ICD-10-CM | POA: Diagnosis present

## 2020-12-09 DIAGNOSIS — E11621 Type 2 diabetes mellitus with foot ulcer: Secondary | ICD-10-CM | POA: Diagnosis not present

## 2020-12-09 DIAGNOSIS — N179 Acute kidney failure, unspecified: Secondary | ICD-10-CM | POA: Diagnosis present

## 2020-12-09 DIAGNOSIS — E13621 Other specified diabetes mellitus with foot ulcer: Secondary | ICD-10-CM | POA: Diagnosis not present

## 2020-12-09 DIAGNOSIS — E785 Hyperlipidemia, unspecified: Secondary | ICD-10-CM | POA: Diagnosis present

## 2020-12-09 DIAGNOSIS — I70235 Atherosclerosis of native arteries of right leg with ulceration of other part of foot: Secondary | ICD-10-CM | POA: Diagnosis not present

## 2020-12-09 LAB — CBC WITH DIFFERENTIAL/PLATELET
Abs Immature Granulocytes: 0.03 10*3/uL (ref 0.00–0.07)
Basophils Absolute: 0 10*3/uL (ref 0.0–0.1)
Basophils Relative: 0 %
Eosinophils Absolute: 0.1 10*3/uL (ref 0.0–0.5)
Eosinophils Relative: 1 %
HCT: 32.4 % — ABNORMAL LOW (ref 39.0–52.0)
Hemoglobin: 10.7 g/dL — ABNORMAL LOW (ref 13.0–17.0)
Immature Granulocytes: 0 %
Lymphocytes Relative: 14 %
Lymphs Abs: 1.2 10*3/uL (ref 0.7–4.0)
MCH: 31.2 pg (ref 26.0–34.0)
MCHC: 33 g/dL (ref 30.0–36.0)
MCV: 94.5 fL (ref 80.0–100.0)
Monocytes Absolute: 1.4 10*3/uL — ABNORMAL HIGH (ref 0.1–1.0)
Monocytes Relative: 17 %
Neutro Abs: 5.4 10*3/uL (ref 1.7–7.7)
Neutrophils Relative %: 68 %
Platelets: 218 10*3/uL (ref 150–400)
RBC: 3.43 MIL/uL — ABNORMAL LOW (ref 4.22–5.81)
RDW: 13.4 % (ref 11.5–15.5)
WBC: 8.2 10*3/uL (ref 4.0–10.5)
nRBC: 0 % (ref 0.0–0.2)

## 2020-12-09 LAB — GLUCOSE, CAPILLARY
Glucose-Capillary: 101 mg/dL — ABNORMAL HIGH (ref 70–99)
Glucose-Capillary: 83 mg/dL (ref 70–99)

## 2020-12-09 LAB — BLOOD CULTURE ID PANEL (REFLEXED) - BCID2

## 2020-12-09 LAB — COMPREHENSIVE METABOLIC PANEL
ALT: 17 U/L (ref 0–44)
AST: 19 U/L (ref 15–41)
Albumin: 2.5 g/dL — ABNORMAL LOW (ref 3.5–5.0)
Alkaline Phosphatase: 80 U/L (ref 38–126)
Anion gap: 12 (ref 5–15)
BUN: 37 mg/dL — ABNORMAL HIGH (ref 8–23)
CO2: 24 mmol/L (ref 22–32)
Calcium: 8.4 mg/dL — ABNORMAL LOW (ref 8.9–10.3)
Chloride: 93 mmol/L — ABNORMAL LOW (ref 98–111)
Creatinine, Ser: 1.75 mg/dL — ABNORMAL HIGH (ref 0.61–1.24)
GFR, Estimated: 39 mL/min — ABNORMAL LOW (ref >60)
Glucose, Bld: 153 mg/dL — ABNORMAL HIGH (ref 70–99)
Potassium: 4.6 mmol/L (ref 3.5–5.1)
Sodium: 129 mmol/L — ABNORMAL LOW (ref 135–145)
Total Bilirubin: 1 mg/dL (ref 0.3–1.2)
Total Protein: 6.5 g/dL (ref 6.5–8.1)

## 2020-12-09 LAB — HEMOGLOBIN A1C
Hgb A1c MFr Bld: 6.6 % — ABNORMAL HIGH (ref 4.8–5.6)
Mean Plasma Glucose: 142.72 mg/dL

## 2020-12-09 LAB — HIV ANTIBODY (ROUTINE TESTING W REFLEX): HIV Screen 4th Generation wRfx: NONREACTIVE

## 2020-12-09 LAB — CBG MONITORING, ED
Glucose-Capillary: 98 mg/dL (ref 70–99)
Glucose-Capillary: 110 mg/dL — ABNORMAL HIGH (ref 70–99)

## 2020-12-09 LAB — TROPONIN I (HIGH SENSITIVITY): Troponin I (High Sensitivity): 33 ng/L — ABNORMAL HIGH (ref <18)

## 2020-12-09 LAB — C-REACTIVE PROTEIN: CRP: 8.3 mg/dL — ABNORMAL HIGH (ref <1.0)

## 2020-12-09 LAB — TSH: TSH: 1.928 u[IU]/mL (ref 0.350–4.500)

## 2020-12-09 LAB — MAGNESIUM: Magnesium: 1.9 mg/dL (ref 1.7–2.4)

## 2020-12-09 MED ORDER — CEFAZOLIN SODIUM-DEXTROSE 2-4 GM/100ML-% IV SOLN
2.0000 g | Freq: Three times a day (TID) | INTRAVENOUS | Status: DC
  Administered 2020-12-09 – 2020-12-16 (×19): 2 g via INTRAVENOUS
  Filled 2020-12-09 (×29): qty 100

## 2020-12-09 MED ORDER — SODIUM CHLORIDE 0.9 % IV SOLN: INTRAVENOUS | Status: DC

## 2020-12-09 MED ORDER — LORAZEPAM 1 MG PO TABS
1.0000 mg | ORAL_TABLET | Freq: Four times a day (QID) | ORAL | Status: DC | PRN
  Administered 2020-12-09 (×3): 1 mg via ORAL
  Filled 2020-12-09 (×3): qty 1

## 2020-12-09 NOTE — ED Notes (Addendum)
Pt A&Ox4 diastolic trending soft in the 50s, pt placed in slight [place] and repositioned, pt reports BP gets a little low at night. [place], MD made aware.

## 2020-12-09 NOTE — Progress Notes (Signed)
Orthopedic Tech Progress Note Patient Details:  Shane [name] [facility] [birth year] [id number]  Ortho Devices Type of Ortho Device: Prafo boot/shoe Ortho Device/Splint Location: RLE Ortho Device/Splint Interventions: Ordered,Application,Adjustment   Post Interventions Patient Tolerated: Well Instructions Provided: Care of device   [name] 12/09/2020, 10:57 AM

## 2020-12-09 NOTE — Evaluation (Signed)
Physical Therapy Evaluation Patient Details Name: Shane [name] MRN: [id number] DOB: 11/30/1941 Today's Date: 12/09/2020   History of Present Illness  Pt is a 80 y/o male admitted secondary to generalized weakness and chills. Found to also have a flutter. Pt with 5th metatarsal wound and is undergoing workup. Pt is s/p CABG 4 weeks ago at [facility]. PMH includes OSA on CPAP, a fib, HTN, CKD, CAD s/p pacemaker, and pulmonary fibrosis.  Clinical Impression  Pt admitted secondary to problem above with deficits below. Pt with RLE weakness at baseline. One LOB noted during gait requiring min A for steadying. Otherwise requiring min guard for ambulation with RW. Feel pt would benefit from HHPT at d/c. Will continue to follow acutely.     Follow Up Recommendations Home health PT;Supervision for mobility/OOB    Equipment Recommendations  None recommended by PT    Recommendations for Other Services       Precautions / Restrictions Precautions Precautions: Fall;Other (comment) Precaution Comments: Maintained sternal precautions Restrictions Weight Bearing Restrictions: No      Mobility  Bed Mobility Overal bed mobility: Needs Assistance Bed Mobility: Supine to Sit;Sit to Supine     Supine to sit: Mod assist Sit to supine: Supervision   General bed mobility comments: Mod A for assist with trunk elevation from stretcher. Increased time required.    Transfers Overall transfer level: Needs assistance Equipment used: Rolling walker (2 wheeled) Transfers: Sit to/from Stand Sit to Stand: Min guard         General transfer comment: Min guard for safety. Mild unsteadiness initially, however, able to regain balance independently.  Ambulation/Gait Ambulation/Gait assistance: Min assist;Min guard Gait Distance (Feet): 50 Feet Assistive device: Rolling walker (2 wheeled) Gait Pattern/deviations: Step-through pattern;Decreased stride length Gait velocity: Decreased   General Gait  Details: Pt with functional weakness noted in RLE. Also with foot drop and required cues for step height. 1 LOB noted requiring min A for stability. Distance limited secondary to fatigue.  Stairs            Wheelchair Mobility    Modified Rankin (Stroke Patients Only)       Balance Overall balance assessment: Needs assistance Sitting-balance support: No upper extremity supported;Feet supported Sitting balance-Leahy Scale: Good     Standing balance support: Bilateral upper extremity supported Standing balance-Leahy Scale: Poor Standing balance comment: Reliant on BUE support                             Pertinent Vitals/Pain Pain Assessment: Faces Faces Pain Scale: Hurts little more Pain Location: chest at incision site Pain Descriptors / Indicators: Aching Pain Intervention(s): Limited activity within patient's tolerance;Monitored during session;Repositioned    Home Living Family/patient expects to be discharged to:: Private residence Living Arrangements: Spouse/significant other Available Help at Discharge: Family;Available PRN/intermittently Type of Home: House Home Access: Stairs to enter Entrance Stairs-Rails: Right;Left;Can reach both Entrance Stairs-Number of Steps: 5 Home Layout: Two level;Able to live on main level with bedroom/bathroom Home Equipment: Shower seat - built in;Cane - single point;Walker - 2 wheels;Grab bars - toilet;Grab bars - tub/shower;Youth worker - 4 wheels      Prior Function Level of Independence: Independent with assistive device(s)         Comments: Has been using walker since surgery.     Hand Dominance        Extremity/Trunk Assessment   Upper Extremity Assessment Upper Extremity Assessment: Defer to OT evaluation  Lower Extremity Assessment Lower Extremity Assessment: Generalized weakness;RLE deficits/detail RLE Deficits / Details: Reports RLE weakness at baseline. Foot in inverted position  and with foot drop.    Cervical / Trunk Assessment Cervical / Trunk Assessment: Normal  Communication   Communication: No difficulties  Cognition Arousal/Alertness: Awake/alert Behavior During Therapy: WFL for tasks assessed/performed Overall Cognitive Status: Within Functional Limits for tasks assessed                                        General Comments      Exercises     Assessment/Plan    PT Assessment Patient needs continued PT services  PT Problem List Decreased strength;Decreased activity tolerance;Decreased balance;Decreased mobility;Decreased knowledge of use of DME       PT Treatment Interventions Gait training;DME instruction;Stair training;Therapeutic activities;Functional mobility training;Balance training;Therapeutic exercise;Patient/family education    PT Goals (Current goals can be found in the Care Plan section)  Acute Rehab PT Goals Patient Stated Goal: to feel better PT Goal Formulation: With patient Time For Goal Achievement: 12/23/20 Potential to Achieve Goals: Good    Frequency Min 3X/week   Barriers to discharge        Co-evaluation               AM-PAC PT "6 Clicks" Mobility  Outcome Measure Help needed turning from your back to your side while in a flat bed without using bedrails?: A Little Help needed moving from lying on your back to sitting on the side of a flat bed without using bedrails?: A Lot Help needed moving to and from a bed to a chair (including a wheelchair)?: A Little Help needed standing up from a chair using your arms (e.g., wheelchair or bedside chair)?: A Little Help needed to walk in hospital room?: A Little Help needed climbing 3-5 steps with a railing? : A Little 6 Click Score: 17    End of Session Equipment Utilized During Treatment: Gait belt Activity Tolerance: Patient limited by fatigue Patient left: in bed;with call bell/phone within reach;with family/visitor present (on bed in  ED) Nurse Communication: Mobility status PT Visit Diagnosis: Unsteadiness on feet (R26.81);Muscle weakness (generalized) (M62.81)    Time: 1610-9604 PT Time Calculation (min) (ACUTE ONLY): 27 min   Charges:   PT Evaluation $PT Eval Moderate Complexity: 1 Mod PT Treatments $Gait Training: 8-22 mins        Lou Miner, DPT  Acute Rehabilitation Services  Pager: [phone] Office: [phone]   Rudean Hitt 12/09/2020, 1:54 PM

## 2020-12-09 NOTE — ED Notes (Signed)
Attending rounding on patient.

## 2020-12-09 NOTE — Progress Notes (Signed)
PHARMACY - PHYSICIAN COMMUNICATION CRITICAL VALUE ALERT - BLOOD CULTURE IDENTIFICATION (BCID)  Hubert Raatz is an 80 y.o. male who presented to [facility] on 12/08/2020, weakness/chills, found with cellulitis around metatarsal.  Assessment:  1/4 MSSA on BCID  Name of physician (or Provider) Contacted: [place]  Current antibiotics: None  Changes to prescribed antibiotics recommended:  Start Cefazolin 2g IV every 8 hours, f/u BCx results, ortho plans and ID recs  Results for orders placed or performed during the hospital encounter of 12/08/20  Blood Culture ID Panel (Reflexed) (Collected: 12/08/2020  6:05 PM)  Result Value Ref Range   Enterococcus faecalis NOT DETECTED NOT DETECTED   Enterococcus Faecium NOT DETECTED NOT DETECTED   Listeria monocytogenes NOT DETECTED NOT DETECTED   Staphylococcus species DETECTED (A) NOT DETECTED   Staphylococcus aureus (BCID) DETECTED (A) NOT DETECTED   Staphylococcus epidermidis NOT DETECTED NOT DETECTED   Staphylococcus lugdunensis NOT DETECTED NOT DETECTED   Streptococcus species NOT DETECTED NOT DETECTED   Streptococcus agalactiae NOT DETECTED NOT DETECTED   Streptococcus pneumoniae NOT DETECTED NOT DETECTED   Streptococcus pyogenes NOT DETECTED NOT DETECTED   A.calcoaceticus-baumannii NOT DETECTED NOT DETECTED   Bacteroides fragilis NOT DETECTED NOT DETECTED   Enterobacterales NOT DETECTED NOT DETECTED   Enterobacter cloacae complex NOT DETECTED NOT DETECTED   Escherichia coli NOT DETECTED NOT DETECTED   Klebsiella aerogenes NOT DETECTED NOT DETECTED   Klebsiella oxytoca NOT DETECTED NOT DETECTED   Klebsiella pneumoniae NOT DETECTED NOT DETECTED   Proteus species NOT DETECTED NOT DETECTED   Salmonella species NOT DETECTED NOT DETECTED   Serratia marcescens NOT DETECTED NOT DETECTED   Haemophilus influenzae NOT DETECTED NOT DETECTED   Neisseria meningitidis NOT DETECTED NOT DETECTED   Pseudomonas aeruginosa NOT DETECTED NOT DETECTED    Stenotrophomonas maltophilia NOT DETECTED NOT DETECTED   Candida albicans NOT DETECTED NOT DETECTED   Candida auris NOT DETECTED NOT DETECTED   Candida glabrata NOT DETECTED NOT DETECTED   Candida krusei NOT DETECTED NOT DETECTED   Candida parapsilosis NOT DETECTED NOT DETECTED   Candida tropicalis NOT DETECTED NOT DETECTED   Cryptococcus neoformans/gattii NOT DETECTED NOT DETECTED   Meth resistant mecA/C and MREJ NOT DETECTED NOT DETECTED    Bertis Ruddy 12/09/2020  2:12 PM

## 2020-12-09 NOTE — Progress Notes (Signed)
PROGRESS NOTE   Shane [name]  VOH:[id number]    DOB: [birth year]    DOA: 12/08/2020  PCP: Burnard Bunting, MD   I have briefly reviewed patients previous medical records in [facility].  Chief Complaint  Patient presents with  . Weakness  . Shortness of Breath  . Cough    Brief Narrative:  80 year old married male, IADLs, PMH of IPF (Dr. Chase Caller, pulmonology, gets infusions of Pamrevlumab 3 3 weeks), OSA on CPAP, CKD stage III AAA, DM, HTN, HLD, advanced heart block s/p PPM, CAD s/p CABG at [facility] 11/04/2020, moderate AS, discharged from [facility] 11/18/2020, slow recovery since, presented to ED due to 1 week or so history of increasing fatigue, chronic dyspnea, recent chills, low-grade fevers, frequent cough.  Chart review of OP notes indicate bouts of atrial tachycardia and A. fib since approximately 12/27 and device clinic from earlier on 1/4 indicates plans to initiate Eliquis.  Patient developed severe weakness, barely able to stand prompting ED visit.  Admitted for new onset A. fib/flutter, generalized weakness and malaise, fevers in the context of right foot wound.  Cardiology and orthopedics consulted.BCID showed MSSA, IV cefazolin initiated.   Assessment & Plan:  Principal Problem:   Generalized weakness Active Problems:   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HCC)   OSA on CPAP   Essential hypertension   Chronic kidney disease, stage 3a (HCC)   Coronary artery disease involving native heart without angina pectoris   Presence of cardiac pacemaker   Dehydration with hyponatremia   Atrial fibrillation, chronic (HCC)   Elevated troponin level not due myocardial infarction   New onset atrial fib/flutter:  Cardiology consultation appreciated.  They do not think that his rate controlled flutter is playing a role in his acute illness/symptoms.  As per the review of PPM: He has been in and out of A. fib since 11/26/2020 that has 100% burden since 12/02/2020.  Cardiology  discussed with patient regarding strategies of rhythm control with TEE/DCCV this hospitalization versus 3 weeks of uninterrupted [place] and outpatient DCCV.  Patient opted for the latter option.  CHA2DS2Vasc is 5, started on Eliquis on 1/4.  Generalized weakness and fatigue  Multifactorial due to dehydration from poor oral intake, AKI, infectious etiology/fevers, recent major surgery and atrial fib/flutter.  Treat underlying cause and monitor.  PT and OT evaluation.  Fever/right foot ulcer  Work-up: No leukocytosis.  CRP up at 8.3.  Lactate normal.  UA not suggestive of UTI.  CT chest without contrast shows no acute pathology.  Flu panel PCR and COVID-19 PCR negative.  BCID: Shows MSSA as per discussion with pharmacy.  Initiated IV cefazolin.  Per pharmacy, automatic ID consult.  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 Sharol Given, Orthopedics input appreciated.  He has requested CT without contrast (unable to do MRI due to pacemaker).  Suspects that patient may require fifth ray amputation.  PRAFO to protect his right heel and forefoot.  Dr. Sharol Given consulted vascular surgery for arteriogram to assess arterial status.  However we will have to be careful given elevated creatinine   Dehydration with hyponatremia  Secondary to poor oral intake and diuretics.  Briefly hold diuretics and hydrate with IV fluids and follow closely.  CAD s/p CABG 11/04/2020/elevated troponin  No anginal symptoms.  Sternotomy scar seems to be healing well.  Continue aspirin, atorvastatin and metoprolol tartrate.  Slightly elevated troponin with  flat trend not consistent with ACS.  Idiopathic pulmonary fibrosis  Outpatient follow-up with pulmonology.  OSA on CPAP  Continue CPAP.  Essential hypertension  Controlled.  Continue metoprolol.  Acute kidney injury  complicating stage III aCKD  Baseline creatinine prior to recent CABG seem to be in the 1.5-1.6 range.  However on 12/13 and 12/14, creatinine was down to 1.2 but back up to 1.5 on 12/30.  Presented with creatinine of 1.58 which is increased to 1.75.  Hold Lasix.  Avoid nephrotoxins.  Hydrate with IVF and follow BMP in a.m.  Wounds  Please see detailed note from [place] RN on 1/5  Anemia of chronic disease  Stable  Body mass index is 33.49 kg/m.   DVT prophylaxis:      Code Status: Full Code Family Communication:  Disposition:  Status is: Observation  The patient will require care spanning > 2 midnights and should be moved to inpatient because: Inpatient level of care appropriate due to severity of illness  Dispo: The patient is from: Home              Anticipated d/c is to: Home              Anticipated d/c date is: 3 days              Patient currently is not medically stable to d/c.        Consultants:   Cardiology Orthopedics/Dr. Sharol Given  Procedures:   None  Antimicrobials:    Anti-infectives (From admission, onward)   Start     Dose/Rate Route Frequency Ordered Stop   12/09/20 1430  ceFAZolin (ANCEF) IVPB 2g/100 mL premix        2 g 200 mL/hr over 30 Minutes Intravenous Every 8 hours 12/09/20 1418          Subjective:  Patient seen this morning while still in ED.  Reported feeling better-no dizziness or nausea.  However quite uncomfortable in bed.  Objective:   Vitals:   12/09/20 1030 12/09/20 1342 12/09/20 1345 12/09/20 1353  BP: (!) 123/53 (!) 121/56 129/62   Pulse: 62 63 62   Resp: (!) 22 16 (!) 24   Temp:    99.3 F (37.4 C)  TempSrc:    Oral  SpO2: 100% 98% 100%   Weight:      Height:        General exam: Pleasant elderly male, moderately built and obese lying supine in bed.  Oral mucosa dry. Respiratory system: Clear to auscultation. Respiratory effort normal. Cardiovascular system: S1 & S2 heard, RRR. No JVD, murmurs, rubs, gallops or  clicks. No pedal edema.  Telemetry personally reviewed: A. fib/flutter with controlled ventricular rate. Gastrointestinal system: Abdomen is nondistended, soft and nontender. No organomegaly or masses felt. Normal bowel sounds heard. Central nervous system: Alert and oriented. No focal neurological deficits. Extremities: Symmetric 5 x 5 power. Skin: Unable to palpate posterior tibial and dorsalis pedis and right lower extremity.  Foot drop noted.  Approximately 2 cm diameter right foot, lateral aspect of fifth metatarsal head wound with scabbing, minimal periwound erythema but on squeezing, purulent/bloody drainage.  No tenderness. Psychiatry: Judgement and insight appear normal. Mood & affect appropriate.     Data Reviewed:   I have personally reviewed following labs and imaging studies   CBC: Recent Labs  Lab 12/08/20 1805 12/09/20 0304  WBC 10.2 8.2  NEUTROABS 7.3 5.4  HGB 11.9* 10.7*  HCT 35.8* 32.4*  MCV 95.0 94.5  PLT 257 174    Basic Metabolic Panel: Recent Labs  Lab 12/08/20 1805 12/08/20 2045 12/09/20 0304  NA 131*  --  129*  K 4.5  --  4.6  CL 92*  --  93*  CO2 26  --  24  GLUCOSE 143*  --  153*  BUN 37*  --  37*  CREATININE 1.58*  --  1.75*  CALCIUM 9.3  --  8.4*  MG  --  1.7 1.9    Liver Function Tests: Recent Labs  Lab 12/08/20 1805 12/09/20 0304  AST 20 19  ALT 19 17  ALKPHOS 90 80  BILITOT 1.2 1.0  PROT 7.3 6.5  ALBUMIN 3.0* 2.5*    CBG: Recent Labs  Lab 12/08/20 2247 12/09/20 0817 12/09/20 1158  GLUCAP 98 98 110*    Microbiology Studies:   Recent Results (from the past 240 hour(s))  Blood culture (routine x 2)     Status: None (Preliminary result)   Collection Time: 12/08/20  6:05 PM   Specimen: BLOOD  Result Value Ref Range Status   Specimen Description BLOOD RIGHT ANTECUBITAL  Final   Special Requests   Final    BOTTLES DRAWN AEROBIC AND ANAEROBIC Blood Culture adequate volume   Culture  Setup Time   Final    GRAM POSITIVE  COCCI IN CLUSTERS AEROBIC BOTTLE ONLY Organism ID to follow CRITICAL RESULT CALLED TO, READ BACK BY AND VERIFIED WITH: A WOLFE PHARMD 1407 12/09/20 A BROWNING Performed at [facility], [place] [address] [place], [place] [address]    Culture GRAM POSITIVE COCCI  Final   Report Status PENDING  Incomplete  Blood culture (routine x 2)     Status: None (Preliminary result)   Collection Time: 12/08/20  6:05 PM   Specimen: BLOOD  Result Value Ref Range Status   Specimen Description BLOOD LEFT ANTECUBITAL  Final   Special Requests   Final    BOTTLES DRAWN AEROBIC AND ANAEROBIC Blood Culture adequate volume   Culture   Final    NO GROWTH < 12 HOURS Performed at [facility], [place] [address], [place], [place] [address]    Report Status PENDING  Incomplete  Resp Panel by RT-PCR (Flu A&B, Covid) Nasopharyngeal Swab     Status: None   Collection Time: 12/08/20  6:05 PM   Specimen: Nasopharyngeal Swab; Nasopharyngeal(NP) swabs in vial transport medium  Result Value Ref Range Status   SARS Coronavirus 2 by RT PCR NEGATIVE NEGATIVE Final    Comment: (NOTE) SARS-CoV-2 target nucleic acids are NOT DETECTED.  The SARS-CoV-2 RNA is generally detectable in upper respiratory specimens during the acute phase of infection. The lowest concentration of SARS-CoV-2 viral copies this assay can detect is 138 copies/mL. A negative result does not preclude SARS-Cov-2 infection and should not be used as the sole basis for treatment or other patient management decisions. A negative result may occur with  improper specimen collection/handling, submission of specimen other than nasopharyngeal swab, presence of viral mutation(s) within the areas targeted by this assay, and inadequate number of viral copies(<138 copies/mL). A negative result must be combined with clinical observations, patient history, and epidemiological information. The expected result is Negative.  Fact Sheet for Patients:   EntrepreneurPulse.com.au  Fact Sheet for Healthcare Providers:  IncredibleEmployment.be  This test is no t yet approved or cleared by the [place] FDA and  has been authorized for detection and/or diagnosis of SARS-CoV-2 by FDA under an Emergency Use  Authorization (EUA). This EUA will remain  in effect (meaning this test can be used) for the duration of the COVID-19 declaration under Section 564(b)(1) of the Act, 21 U.S.C.section 360bbb-3(b)(1), unless the authorization is terminated  or revoked sooner.       Influenza A by PCR NEGATIVE NEGATIVE Final   Influenza B by PCR NEGATIVE NEGATIVE Final    Comment: (NOTE) The Xpert Xpress SARS-CoV-2/FLU/RSV plus assay is intended as an aid in the diagnosis of influenza from Nasopharyngeal swab specimens and should not be used as a sole basis for treatment. Nasal washings and aspirates are unacceptable for Xpert Xpress SARS-CoV-2/FLU/RSV testing.  Fact Sheet for Patients: EntrepreneurPulse.com.au  Fact Sheet for Healthcare Providers: IncredibleEmployment.be  This test is not yet approved or cleared by the [place] FDA and has been authorized for detection and/or diagnosis of SARS-CoV-2 by FDA under an Emergency Use Authorization (EUA). This EUA will remain in effect (meaning this test can be used) for the duration of the COVID-19 declaration under Section 564(b)(1) of the Act, 21 U.S.C. section 360bbb-3(b)(1), unless the authorization is terminated or revoked.  Performed at [facility], [place] [address] [place], [place] [address]   Blood Culture ID Panel (Reflexed)     Status: Abnormal   Collection Time: 12/08/20  6:05 PM  Result Value Ref Range Status   Enterococcus faecalis NOT DETECTED NOT DETECTED Final   Enterococcus Faecium NOT DETECTED NOT DETECTED Final   Listeria monocytogenes NOT DETECTED NOT DETECTED Final   Staphylococcus species  DETECTED (A) NOT DETECTED Final    Comment: CRITICAL RESULT CALLED TO, READ BACK BY AND VERIFIED WITH: A WOLFE PHARMD 1407 12/09/20 A BROWNING    Staphylococcus aureus (BCID) DETECTED (A) NOT DETECTED Final    Comment: CRITICAL RESULT CALLED TO, READ BACK BY AND VERIFIED WITH: A WOLFE PHARMD 1407 12/09/20 A BROWNING    Staphylococcus epidermidis NOT DETECTED NOT DETECTED Final   Staphylococcus lugdunensis NOT DETECTED NOT DETECTED Final   Streptococcus species NOT DETECTED NOT DETECTED Final   Streptococcus agalactiae NOT DETECTED NOT DETECTED Final   Streptococcus pneumoniae NOT DETECTED NOT DETECTED Final   Streptococcus pyogenes NOT DETECTED NOT DETECTED Final   A.calcoaceticus-baumannii NOT DETECTED NOT DETECTED Final   Bacteroides fragilis NOT DETECTED NOT DETECTED Final   Enterobacterales NOT DETECTED NOT DETECTED Final   Enterobacter cloacae complex NOT DETECTED NOT DETECTED Final   Escherichia coli NOT DETECTED NOT DETECTED Final   Klebsiella aerogenes NOT DETECTED NOT DETECTED Final   Klebsiella oxytoca NOT DETECTED NOT DETECTED Final   Klebsiella pneumoniae NOT DETECTED NOT DETECTED Final   Proteus species NOT DETECTED NOT DETECTED Final   Salmonella species NOT DETECTED NOT DETECTED Final   Serratia marcescens NOT DETECTED NOT DETECTED Final   Haemophilus influenzae NOT DETECTED NOT DETECTED Final   Neisseria meningitidis NOT DETECTED NOT DETECTED Final   Pseudomonas aeruginosa NOT DETECTED NOT DETECTED Final   Stenotrophomonas maltophilia NOT DETECTED NOT DETECTED Final   Candida albicans NOT DETECTED NOT DETECTED Final   Candida auris NOT DETECTED NOT DETECTED Final   Candida glabrata NOT DETECTED NOT DETECTED Final   Candida krusei NOT DETECTED NOT DETECTED Final   Candida parapsilosis NOT DETECTED NOT DETECTED Final   Candida tropicalis NOT DETECTED NOT DETECTED Final   Cryptococcus neoformans/gattii NOT DETECTED NOT DETECTED Final   Meth resistant mecA/C and MREJ NOT  DETECTED NOT DETECTED Final    Comment: Performed at [facility], [address]  [place], [place] [address]     Radiology Studies:  CT CHEST WO CONTRAST  Result Date: 12/08/2020 CLINICAL DATA:  80 year old male with idiopathic pulmonary fibrosis. Concern for pneumonia or abscess. EXAM: CT CHEST WITHOUT CONTRAST TECHNIQUE: Multidetector CT imaging of the chest was performed following the standard protocol without IV contrast. COMPARISON:  Chest CT dated 09/30/2019. FINDINGS: Evaluation of this exam is limited in the absence of intravenous contrast. Cardiovascular: There is mild cardiomegaly. No significant pericardial effusion. Three-vessel coronary vascular calcification and postsurgical changes of CABG. Left pectoral pacemaker device. There is mild atherosclerotic calcification of the thoracic aorta. There is dilatation of the main pulmonary trunk suggestive of a degree of pulmonary hypertension. Clinical correlation is recommended Mediastinum/Nodes: No hilar or mediastinal adenopathy. The esophagus is grossly unremarkable. No mediastinal fluid collection. Lungs/Pleura: Diffuse bilateral interstitial coarsening as seen previously in keeping with history of pulmonary fibrosis. No new consolidation. There is no pleural effusion pneumothorax. The central airways are patent. Upper Abdomen: Slight irregularity of the liver contour may represent early changes of cirrhosis. Musculoskeletal: Median sternotomy wires. Degenerative changes of the spine. No acute osseous pathology. Partially visualized cervical fusion. IMPRESSION: 1. No acute intrathoracic pathology. 2. Pulmonary fibrosis. 3. Mild cardiomegaly with three-vessel coronary vascular calcification and postsurgical changes of CABG. 4. Aortic Atherosclerosis (ICD10-I70.0). Electronically Signed   By: Anner Crete M.D.   On: 12/08/2020 22:39   DG Chest Port 1 View  Result Date: 12/08/2020 CLINICAL DATA:  Shortness of breath EXAM: PORTABLE CHEST 1  VIEW COMPARISON:  07/17/2020, CT 09/30/2019, 03/31/2020, 04/27/2018 FINDINGS: Surgical hardware in the cervical spine. Left-sided pacing device as before. Interval post sternotomy changes. Diffuse bilateral left greater than right reticular and ground-glass opacity, likely corresponding to history of pulmonary fibrosis. Chronic pleural scarring on the left. No acute airspace consolidation or pneumothorax. Stable cardiomediastinal silhouette. IMPRESSION: 1. Stable radiographic appearance of the chest with pulmonary fibrosis. No acute airspace disease. Electronically Signed   By: Donavan Foil M.D.   On: 12/08/2020 19:05   VAS [place] ABI WITH/WO TBI  Result Date: 12/09/2020 LOWER EXTREMITY DOPPLER STUDY Indications: Ulceration, and Chronic ulcer to RLE forefoot. High Risk         Hypertension, hyperlipidemia, Diabetes, coronary artery Factors:          disease. Other Factors: AFIB, CKD3, PM.  Comparison Study: No previous exams. Performing Technologist: Rogelia Rohrer RVT, RDMS  Examination Guidelines: A complete evaluation includes at minimum, Doppler waveform signals and systolic blood pressure reading at the level of bilateral brachial, anterior tibial, and posterior tibial arteries, when vessel segments are accessible. Bilateral testing is considered an integral part of a complete examination. Photoelectric Plethysmograph (PPG) waveforms and toe systolic pressure readings are included as required and additional duplex testing as needed. Limited examinations for reoccurring indications may be performed as noted.  ABI Findings: +---------+------------------+-----+-------------------+----------------+ Right    Rt Pressure (mmHg)IndexWaveform           Comment          +---------+------------------+-----+-------------------+----------------+ Brachial 122                    biphasic                            +---------+------------------+-----+-------------------+----------------+ PTA                              dampened monophasicnon compressible +---------+------------------+-----+-------------------+----------------+  DP       80                0.62 dampened monophasic                 +---------+------------------+-----+-------------------+----------------+ Great Toe0                 0.00 Absent                              +---------+------------------+-----+-------------------+----------------+ +---------+------------------+-----+----------+----------------+ Left     Lt Pressure (mmHg)IndexWaveform  Comment          +---------+------------------+-----+----------+----------------+ Brachial 130                    triphasic                  +---------+------------------+-----+----------+----------------+ PTA                             biphasic  non compressible +---------+------------------+-----+----------+----------------+ DP       81                0.62 monophasic                 +---------+------------------+-----+----------+----------------+ Great Toe47                0.36 Abnormal                   +---------+------------------+-----+----------+----------------+ +-------+-----------+-----------+------------+------------+ ABI/TBIToday's ABIToday's TBIPrevious ABIPrevious TBI +-------+-----------+-----------+------------+------------+ Right  0.62       0                                   +-------+-----------+-----------+------------+------------+ Left   0.62       0.36                                +-------+-----------+-----------+------------+------------+  Summary: Right: Resting right ankle-brachial index indicates moderate right lower extremity arterial disease. The right toe-brachial index is abnormal. ABI's likely falsely elevated due to vessel calcification. Left: Resting left ankle-brachial index indicates moderate left lower extremity arterial disease. The left toe-brachial index is abnormal.  *See table(s) above for measurements and  observations.  Electronically signed by Jamelle Haring on 12/09/2020 at 12:28:38 PM.    Final    CUP [place]  Result Date: 12/08/2020 Pacemaker check in clinic. Normal device function. Thresholds, sensing, impedances consistent with previous measurements. Device programmed to maximize longevity. AT/AF burden 24%, appear AF w/ controlled VR. No OAC on file. Patient will start Eliquis and come back 01/06/21 to attempt in office ATP. No high ventricular rates noted. Device programmed at appropriate safety margins. Histogram distribution appropriate for patient activity level. Device programmed to optimize intrinsic conduction. Estimated longevity 8 years 11 months. Patient enrolled in remote follow-up. Patient education completed. ROV w/ Dr. Lovena Le 01/06/21.Lavenia Atlas, BSN, RN    Scheduled Meds:   . apixaban  5 mg Oral BID  . aspirin EC  81 mg Oral Daily  . atorvastatin  40 mg Oral Daily  . insulin aspart  0-15 Units Subcutaneous TID AC & HS  . insulin aspart  10 Units Subcutaneous TID WC  . insulin detemir  20 Units Subcutaneous QHS  . metoprolol  tartrate  50 mg Oral BID  . pantoprazole  40 mg Oral Daily    Continuous Infusions:   .  ceFAZolin (ANCEF) IV       LOS: 0 days     Vernell Leep, MD, [place], [facility], [facility]    To contact the attending provider between 7A-7P or the covering provider during after hours 7P-7A, please log into the web site [email or web address] and access using universal [place] password for that web site. If you do not have the password, please call the hospital operator.  12/09/2020, 2:32 PM

## 2020-12-09 NOTE — Consult Note (Signed)
WOC Nurse Consult Note: Reason for Consult: Wound to right foot lateral aspect at 5th metatarsal head. Patient with DM, foot drop and inward rotation of foot. Patient assessed with wife in room who additionally asks that I assess the intragluteal wound and left buttock wounds. She describes their origin as originating from [facility] where the patient recently had CABG x3. SHe states that the buttock wound was related to the inadvertent removal of a skin tag with silicone foam dressing removal while an inpatient. The intragluteal wound was discovered upon arrival home. Wound type:Pressure, trauma Pressure Injury POA: Yes Measurement: Right foot, lateral aspect at 5th metatarsal head: 1.5cm x 2.2cm area with depth unable to be determined due to the presence of dried serum (scabbing). Periwound erythema, induration and warmth to anterior foot, no fluctuance or drainage. Wife reports this is a recent presentation of approximately 48 hours. Right foot, lateral aspect at 5th digit:  0.2cm round black discoloration with no depth, no elevation, no periwound erythema. Wife reports she has been applying a cream to this daily for several weeks and there is no change from the previous presentation. Intragluteal Stage 3 pressure injury (POA):  2.8cm x 1.5cm x 0.2cm with pale pink wound bed, small amount of serous to light yellow exudate. Intact periwound skin. Left buttock: 1cm x 0.8cm x 0.1cm partial thickness skin loss with pink, moist wound bed, no exudate, no periwound erythema.  Wound bed: As described above Drainage (amount, consistency, odor) As described above Periwound:As described above Dressing procedure/placement/frequency: I will provide bilateral pressure redistribution heel boots to the feet to both float the heels and provide some support for alignment-particularly of right foot. Topical care will be with an antimicrobial nonadherent (xeroform) twice daily. The intragluteal and left buttock areas will  also be dressed with xeroform gauze as a wound contact layer, but will be topped with dry gauze and covered with a sacral silicone foam dressing. Patient will continue to be turned and repositioned from side to side per house protocols.  I recommend that further evaluation of the right lateral foot ulcer be considered up to and including a consultation with orthopedics due to the presentation of possible soft tissue or bone infection at this location.  If you agree, please order/arrange for consultation.  [place] nursing team will not follow, but will remain available to this patient, the nursing and medical teams.  Please re-consult if needed. Thanks, Maudie Flakes, MSN, RN, [place], [facility]  Pager# [phone]

## 2020-12-09 NOTE — Discharge Instructions (Signed)

## 2020-12-09 NOTE — Consult Note (Signed)
[facility] for Infectious Disease    Date of Admission:  12/08/2020     Total days of antibiotics 0  Cefazolin                Reason for Consult: MSSA Bacteremia    Referring Provider: [place]  Primary Care Provider: Burnard Bunting, MD   Assessment: Shane [name] is a 80 y.o. male admitted from home for weakness and shaking chills, now found to have positive blood cultures growing methicillin sensitive staphylococcus aureus in both aerobic bottles.  He has not been febrile and no leukocytosis thusfar. Dr. Sharol Given has evaluated his right foot wound (likely the source of infection) - CT scan without any abscess and no changes to bone indicating osteomyelitis. Awaiting ABIs to be read. Foot is warm but pulses are challenging to palpate.   He originally declined TEE/DCCV with EP team, however now knowing he was bacteremic we should re-consider this test to evaluate pacemaker lead and native valves. Discussed with he and his wife that there is a decent chance that the bacteria can stick to the wire or his valves and this would affect antibiotic duration.   Please hold on any picc / central line if hemodynamics allow until blood cultures clear.   Follow for further sites of metastatic infection - has superficial sacral wound that does not seem to be affected. Would continue topical skin protectant and foam dressing with offloading.    Plan: 1. Cefazolin IV  2. Repeat blood cultures in 24h  3. Would recommend TEE to evaluate device.  4. Follow ABIs and Dr. Jess Barters recommendations  5. Topical skin protectant, foam dressing to sacrum    Principal Problem:   MSSA bacteremia Active Problems:   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HCC)   OSA on CPAP   Essential hypertension   Chronic kidney disease, stage 3a (HCC)   Coronary artery disease involving native heart without angina pectoris   Cardiac device in situ   Generalized weakness   Dehydration with hyponatremia    Atrial fibrillation, chronic (HCC)   Elevated troponin level not due myocardial infarction   Diabetic foot ulcer ([place])   . apixaban  5 mg Oral BID  . aspirin EC  81 mg Oral Daily  . atorvastatin  40 mg Oral Daily  . insulin aspart  0-15 Units Subcutaneous TID AC & HS  . insulin aspart  10 Units Subcutaneous TID WC  . insulin detemir  20 Units Subcutaneous QHS  . metoprolol tartrate  50 mg Oral BID  . pantoprazole  40 mg Oral Daily    HPI: Shane [name] is a 80 y.o. male admitted from home for dizziness and feeling tired.   His wife, [name] is at the bedside today. He recently had a CABG at [facility] December 15th with SVG harvested from L leg. He says he has been recovering well aside from needing intermittent oxygen at home and inability to lie flat due to ILD.   He noticed increased shaking chills and fatigue/dizziness over the last few days. [name] also notes that he has had a wound on the lateral side of his R foot that has changed over the last few days. It has gotten progressively more red and started having some drainage out of it. Purplish discoloration around the central ulcer as well. Sternal and LLeg incision are all healing well.   He has not noticed any sweating or fevers. He does have a pacemaker  in place.    Review of Systems: Review of Systems  Constitutional: Positive for malaise/fatigue. Negative for chills and fever.  HENT: Negative for tinnitus.   Eyes: Positive for blurred vision (stable and chronic). Negative for photophobia.  Respiratory: Positive for shortness of breath (stable since surger - cannot lie flat and needs PRN o2). Negative for cough and sputum production.   Cardiovascular: Negative for chest pain.  Gastrointestinal: Negative for diarrhea, nausea and vomiting.  Genitourinary: Negative for dysuria.  Musculoskeletal: Negative for back pain and myalgias.  Skin: Negative for rash.       +wound  Neurological: Negative for headaches.    Past  Medical History:  Diagnosis Date  . 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 Cataract   . Chronic kidney disease    d/t DM  . CKD (chronic kidney disease), stage III ([place])   . Diabetes mellitus    Vgo disposal insulin bolus  simular to insulin pump  . Dyspnea   . GERD (gastroesophageal reflux disease)   . Hyperlipidemia   . Hypertension   . Idiopathic pulmonary fibrosis ([place]) 11/2016  . ILD (interstitial lung disease) ([place])   . Moderate aortic stenosis    a. 10/2019 Echo: EF 55-60%, Gr2 DD. Nl RV.   Marland Kitchen Neuromuscular disorder ([place])   . Nonobstructive CAD (coronary artery disease)    a. 2012 Cath: mod, nonobs dzs; b. 10/2016 MV: EF 60%, no ischemia.  . OSA on CPAP 05/05/2017   Unattended Home Sleep Test 7/2/813-AHI 38.6/hour, desaturation to 64%, body weight 261 pounds  . PONV (postoperative nausea and vomiting)   . Sleep apnea     uses cpap asked to bring mask and tubing    Social History   Tobacco Use  . Smoking status: Never Smoker  . Smokeless tobacco: Never Used  Vaping Use  . Vaping Use: Never used  Substance Use Topics  . Alcohol use: No  . Drug use: No    Family History  Problem Relation Age of Onset  . Diabetes Mellitus II Mother   . Emphysema Father 44  . Heart attack Father   . Colon cancer Neg Hx   . Esophageal cancer Neg Hx   . Rectal cancer Neg Hx   . Stomach cancer Neg Hx    Allergies  Allergen Reactions  . Codeine Hives and Itching  . Ofev [Nintedanib] Diarrhea    SEVERE DIARRHEA  . Other Diarrhea and Other (See Comments)    Esbriet causes elevated LFTs. D/C on 06/14/17 and SEVERE DIARRHEA    OBJECTIVE: Blood pressure 121/65, pulse 62, temperature 99.3 F (37.4 C), temperature source Oral, resp. rate 18, height 6' (1.829 m), weight 115.9 kg, SpO2 93 %.  Physical Exam Vitals reviewed.  Constitutional:      General: He is not in acute distress.    Appearance: He is well-nourished. He is not toxic-appearing.     Comments: Resting quietly in bed.   HENT:      Mouth/Throat:     Mouth: Oropharynx is clear and moist and mucous membranes are normal.     Dentition: Normal dentition. No dental abscesses.  Cardiovascular:     Rate and Rhythm: Normal rate and regular rhythm.     Heart sounds: Normal heart sounds.  Pulmonary:     Effort: Pulmonary effort is normal.     Breath sounds: Normal breath sounds.  Abdominal:     General: There is no distension.     Palpations: Abdomen is soft.  Tenderness: There is no abdominal tenderness.  Musculoskeletal:     Right lower leg: No edema.     Comments: Wound present on R #5 metatarsal   Lymphadenopathy:     Cervical: No cervical adenopathy.  Skin:    General: Skin is warm and dry.     Findings: No rash.  Neurological:     Mental Status: He is alert and oriented to person, place, and time.  Psychiatric:        Mood and Affect: Mood and affect normal.        Judgment: Judgment normal.     Comments: In good spirits today and engaged in care discussion.           Lab Results Lab Results  Component Value Date   WBC 8.2 12/09/2020   HGB 10.7 (L) 12/09/2020   HCT 32.4 (L) 12/09/2020   MCV 94.5 12/09/2020   PLT 218 12/09/2020    Lab Results  Component Value Date   CREATININE 1.75 (H) 12/09/2020   BUN 37 (H) 12/09/2020   NA 129 (L) 12/09/2020   K 4.6 12/09/2020   CL 93 (L) 12/09/2020   CO2 24 12/09/2020    Lab Results  Component Value Date   ALT 17 12/09/2020   AST 19 12/09/2020   ALKPHOS 80 12/09/2020   BILITOT 1.0 12/09/2020     Microbiology: BCx 12/08/2020 >> growing in both aerobic bottles    [name], MSN, NP-C [facility] [email or web address] Pager: [phone] Office: [place]: [phone]

## 2020-12-09 NOTE — CV Procedure (Signed)
ABI completed.  Dr Algis Liming notified of results at 1138.  Results can be found under chart review under CV PROC. 12/09/2020 11:42 AM Sophira Rumler RVT, RDMS

## 2020-12-09 NOTE — Progress Notes (Signed)
Patient placed on CPAP by RN.  Patient resting well.

## 2020-12-09 NOTE — Consult Note (Signed)
ASSESSMENT & PLAN:  80 y.o. male with atherosclerosis of native arteries of right lower extremity causing right fifth toe / metatarsal ulceration.   Recommend the following which can slow the progression of atherosclerosis and reduce the risk of major adverse cardiac / limb events:  Complete cessation from all tobacco products. Blood glucose control with goal A1c < 7%. Blood pressure control with goal blood pressure < 140/90 mmHg. Lipid reduction therapy with goal LDL-C <100 mg/dL (<70 if symptomatic from PAD).  Adequate hydration (at least 2 liters / day) if patient's heart and kidney function is adequate. Aspirin 81mg  PO QD.  Atorvastatin 40-80mg  PO QD (or other "high intensity" statin therapy).  Plan CO2 angiography with likely RLE intervention via LCFA access tomorrow in cath lab if primary team feels it safe to proceed.  CHIEF COMPLAINT:   Right foot ulcer  HISTORY:  HISTORY OF PRESENT ILLNESS: Shane [name] is a 80 y.o. male admitted to the internal medicine service for profound weakness.  History significant for sleep apnea, pulmonary fibrosis, CKD 3A, abdominal aortic aneurysm, diabetes, hypertension, hyperlipidemia, heart block requiring pacemaker, coronary artery disease status post recent CABG at [facility], aortic stenosis.  He was recently discharged (11/18/2020) from [facility] after coronary artery bypass grafting.  He is noted a week of progressive fatigue and dyspnea.  He is noted to be in atrial tachycardia through device interrogation.  His weakness became profound prompting his presentation.  An ulcer was found on his right foot.  He also reports recent low-grade fevers and malaise.  Blood cultures were found to be MSSA positive.  Toe ulceration prompted orthopedic consultation.  ABIs were obtained.  Vascular surgery consultation was then obtained.  On my exam, the patient reports he is previously ambulatory without symptoms of intermittent claudication.  He has never had a  history of ischemic rest pain or ischemic ulceration.  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  Feels a bit unsteady on this foot, but is ambulatory.  Past Medical History:  Diagnosis Date  . 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 Cataract   . Chronic kidney disease    d/t DM  . CKD (chronic kidney disease), stage III ([place])   . Diabetes mellitus    Vgo disposal insulin bolus  simular to insulin pump  . Dyspnea   . GERD (gastroesophageal reflux disease)   . Hyperlipidemia   . Hypertension   . Idiopathic pulmonary fibrosis ([place]) 11/2016  . ILD (interstitial lung disease) ([place])   . Moderate aortic stenosis    a. 10/2019 Echo: EF 55-60%, Gr2 DD. Nl RV.   Marland Kitchen Neuromuscular disorder ([place])   . Nonobstructive CAD (coronary artery disease)    a. 2012 Cath: mod, nonobs dzs; b. 10/2016 MV: EF 60%, no ischemia.  . OSA on CPAP 05/05/2017   Unattended Home Sleep Test 7/2/813-AHI 38.6/hour, desaturation to 64%, body weight 261 pounds  . PONV (postoperative nausea and vomiting)   . Sleep apnea     uses cpap asked to bring mask and tubing    Past Surgical History:  Procedure Laterality Date  . ANTERIOR FUSION CERVICAL SPINE  2012  . CARDIAC CATHETERIZATION  2011  . CARDIAC CATHETERIZATION N/A 11/09/2016   Procedure: Right Heart Cath;  Surgeon: Belva Crome, MD;  Location: [facility];  Service: Cardiovascular;  Laterality: N/A;  . carpel tunnel     left wrist  . CATARACT EXTRACTION    .  CATARACT EXTRACTION W/ INTRAOCULAR LENS  IMPLANT, BILATERAL  2013  . CERVICAL LAMINECTOMY  2012  . COLONOSCOPY N/A 01/14/2013   Procedure: COLONOSCOPY;  Surgeon: Irene Shipper, MD;  Location: [facility];  Service: Endoscopy;  Laterality: N/A;  . CORONARY ARTERY BYPASS GRAFT  11/04/2020   LIMA-LAD, SVG-OM1, SVG-PDA (Dr Marney [facility]) dc 11/18/2020  . EYE SURGERY    . [place]   left  . LEFT HEART CATH AND CORONARY ANGIOGRAPHY N/A 07/10/2020    Procedure: LEFT HEART CATH AND CORONARY ANGIOGRAPHY;  Surgeon: Sherren Mocha, MD;  Location: [facility];  Service: Cardiovascular;  Laterality: N/A;  . LUMBAR LAMINECTOMY  2003  . LUNG BIOPSY Left 12/26/2016   Procedure: LUNG BIOPSY;  Surgeon: [name], MD;  Location: [facility];  Service: Thoracic;  Laterality: Left;  . PACEMAKER IMPLANT N/A 03/30/2020   Procedure: PACEMAKER IMPLANT;  Surgeon: Evans Lance, MD;  Location: [place] [facility];  Service: Cardiovascular;  Laterality: N/A;  . POSTERIOR FUSION CERVICAL SPINE  2012  . TRIGGER FINGER RELEASE  2011   4th finger left hand  . VIDEO ASSISTED THORACOSCOPY Left 12/26/2016   Procedure: VIDEO ASSISTED THORACOSCOPY;  Surgeon: [name], MD;  Location: [facility];  Service: Thoracic;  Laterality: Left;  Marland Kitchen VIDEO BRONCHOSCOPY N/A 12/26/2016   Procedure: VIDEO BRONCHOSCOPY;  Surgeon: [name], MD;  Location: [facility];  Service: Thoracic;  Laterality: N/A;    Family History  Problem Relation Age of Onset  . Diabetes Mellitus II Mother   . Emphysema Father 76  . Heart attack Father   . Colon cancer Neg Hx   . Esophageal cancer Neg Hx   . Rectal cancer Neg Hx   . Stomach cancer Neg Hx     Social History   Socioeconomic History  . Marital status: Married    Spouse name: Not on file  . Number of children: Not on file  . Years of education: Not on file  . Highest education level: Not on file  Occupational History  . Not on file  Tobacco Use  . Smoking status: Never Smoker  . Smokeless tobacco: Never Used  Vaping Use  . Vaping Use: Never used  Substance and Sexual Activity  . Alcohol use: No  . Drug use: No  . Sexual activity: Not Currently  Other Topics Concern  . 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    Social Determinants of Health   Financial Resource Strain: Not on file  Food Insecurity: Not on file  Transportation Needs: Not on file  Physical Activity:  Not on file  Stress: Not on file  Social Connections: Not on file  Intimate Partner Violence: Not on file    Allergies  Allergen Reactions  . Codeine Hives and Itching  . Ofev [Nintedanib] Diarrhea    SEVERE DIARRHEA  . Other Diarrhea and Other (See Comments)    Esbriet causes elevated LFTs. D/C on 06/14/17 and SEVERE DIARRHEA    Current Facility-Administered Medications  Medication Dose Route Frequency Provider Last Rate Last Admin  . 0.9 %  sodium chloride infusion   Intravenous Continuous Hongalgi, Anand D, MD      . acetaminophen (TYLENOL) tablet 650 mg  650 mg Oral Q4H PRN Shalhoub, Sherryll Burger, MD      . albuterol (VENTOLIN HFA) 108 (90 Base) MCG/ACT inhaler 2 puff  2 puff Inhalation Q4H PRN Inda Merlin  J, MD      . apixaban (ELIQUIS) tablet 5 mg  5 mg Oral BID Vernelle Emerald, MD   5 mg at 12/09/20 1224  . aspirin EC tablet 81 mg  81 mg Oral Daily Vernelle Emerald, MD   81 mg at 12/09/20 1223  . atorvastatin (LIPITOR) tablet 40 mg  40 mg Oral Daily Shalhoub, Sherryll Burger, MD   40 mg at 12/09/20 1223  . ceFAZolin (ANCEF) IVPB 2g/100 mL premix  2 g Intravenous Q8H Bertis Ruddy, RPH      . hydrALAZINE (APRESOLINE) injection 10 mg  10 mg Intravenous Q6H PRN Shalhoub, Sherryll Burger, MD      . insulin aspart (novoLOG) injection 0-15 Units  0-15 Units Subcutaneous TID AC & HS Shalhoub, Sherryll Burger, MD      . insulin aspart (novoLOG) injection 10 Units  10 Units Subcutaneous TID WC Shalhoub, Sherryll Burger, MD   10 Units at 12/09/20 1351  . insulin detemir (LEVEMIR) injection 20 Units  20 Units Subcutaneous QHS Shalhoub, Sherryll Burger, MD   20 Units at 12/08/20 2300  . ketotifen (ZADITOR) 0.025 % ophthalmic solution 1 drop  1 drop Both Eyes BID PRN Shalhoub, Sherryll Burger, MD      . LORazepam (ATIVAN) tablet 1 mg  1 mg Oral Q6H PRN Shalhoub, Sherryll Burger, MD   1 mg at 12/09/20 1223  . metoprolol tartrate (LOPRESSOR) tablet 50 mg  50 mg Oral BID Vernelle Emerald, MD   50 mg at 12/09/20 1223  . ondansetron  (ZOFRAN) injection 4 mg  4 mg Intravenous Q6H PRN Shalhoub, Sherryll Burger, MD   4 mg at 12/08/20 2046  . pantoprazole (PROTONIX) EC tablet 40 mg  40 mg Oral Daily Shalhoub, Sherryll Burger, MD   40 mg at 12/09/20 1223  . polyethylene glycol (MIRALAX / GLYCOLAX) packet 17 g  17 g Oral Daily PRN Shalhoub, Sherryll Burger, MD      . prochlorperazine (COMPAZINE) injection 10 mg  10 mg Intravenous Q6H PRN Shalhoub, Sherryll Burger, MD   10 mg at 12/09/20 7564    REVIEW OF SYSTEMS:  [X]  denotes positive finding, [ ]  denotes negative finding Cardiac  Comments:  Chest pain or chest pressure:    Shortness of breath upon exertion:    Short of breath when lying flat:    Irregular heart rhythm:        Vascular    Pain in calf, thigh, or hip brought on by ambulation:    Pain in feet at night that wakes you up from your sleep:     Blood clot in your veins:    Leg swelling:         Pulmonary    Oxygen at home:    Productive cough:     Wheezing:         Neurologic    Sudden weakness in arms or legs:     Sudden numbness in arms or legs:     Sudden onset of difficulty speaking or slurred speech:    Temporary loss of vision in one eye:     Problems with dizziness:         Gastrointestinal    Blood in stool:     Vomited blood:         Genitourinary    Burning when urinating:     Blood in urine:        Psychiatric    Major depression:  Hematologic    Bleeding problems:    Problems with blood clotting too easily:        Skin    Rashes or ulcers:        Constitutional    Fever or chills:     PHYSICAL EXAM:   Vitals:   12/09/20 1345 12/09/20 1353 12/09/20 1513 12/09/20 1559  BP: 129/62  121/65   Pulse: 62  62   Resp: (!) 24  18   Temp:  99.3 F (37.4 C) 99.3 F (37.4 C)   TempSrc:  Oral Oral   SpO2: 100%  93% 100%  Weight:   115.9 kg   Height:   6' (1.829 m)    Constitutional: Well appearing in no distress. Appears well nourished.  Neurologic: Normal gait and station. CN intact. No  weakness. No sensory loss. Psychiatric: Mood and affect symmetric and appropriate. Eyes: No icterus. No conjunctival pallor. Ears, nose, throat: mucous membranes moist. Midline trachea. No carotid bruit. Cardiac: regular rate and rhythm.  Respiratory: unlabored. Abdominal: soft, non-tender, non-distended. No palpable pulsatile abdominal mass. Peripheral vascular:  No pedal pulses  Right foot lateral aspect of fifth metatarsal ulcerated. ? Decubitus with foot drop. Extremity: No edema. No cyanosis. No pallor.  Skin: No gangrene.   Lymphatic: No Stemmer's sign. No palpable lymphadenopathy.   DATA REVIEW:    Most recent CBC CBC Latest Ref Rng & Units 12/09/2020 12/08/2020 07/17/2020  WBC 4.0 - 10.5 K/uL 8.2 10.2 6.8  Hemoglobin 13.0 - 17.0 g/dL 10.7(L) 11.9(L) 14.0  Hematocrit 39.0 - 52.0 % 32.4(L) 35.8(L) 41.2  Platelets 150 - 400 K/uL 218 257 193     Most recent CMP CMP Latest Ref Rng & Units 12/09/2020 12/08/2020 07/17/2020  Glucose 70 - 99 mg/dL 153(H) 143(H) 174(H)  BUN 8 - 23 mg/dL 37(H) 37(H) 23  Creatinine 0.61 - 1.24 mg/dL 1.75(H) 1.58(H) 1.45(H)  Sodium 135 - 145 mmol/L 129(L) 131(L) 131(L)  Potassium 3.5 - 5.1 mmol/L 4.6 4.5 4.9  Chloride 98 - 111 mmol/L 93(L) 92(L) 96(L)  CO2 22 - 32 mmol/L 24 26 25   Calcium 8.9 - 10.3 mg/dL 8.4(L) 9.3 8.8(L)  Total Protein 6.5 - 8.1 g/dL 6.5 7.3 7.5  Total Bilirubin 0.3 - 1.2 mg/dL 1.0 1.2 1.0  Alkaline Phos 38 - 126 U/L 80 90 68  AST 15 - 41 U/L 19 20 24   ALT 0 - 44 U/L 17 19 21     Renal function Estimated Creatinine Clearance: 45 mL/min (A) (by C-G formula based on SCr of 1.75 mg/dL (H)).  Hgb A1c MFr Bld (%)  Date Value  12/09/2020 6.6 (H)    LDL Cholesterol  Date Value Ref Range Status  02/04/2011  0 - 99 mg/dL Final   58        Total Cholesterol/HDL:CHD Risk Coronary Heart Disease Risk Table                     Men   Women  1/2 Average Risk   3.4   3.3  Average Risk       5.0   4.4  2 X Average Risk   9.6   7.1  3 X  Average Risk  23.4   11.0        Use the calculated Patient Ratio above and the CHD Risk Table to determine the patient's CHD Risk.        ATP III CLASSIFICATION (LDL):  <100     mg/dL  Optimal  100-129  mg/dL   Near or Above                    Optimal  130-159  mg/dL   Borderline  160-189  mg/dL   High  >190     mg/dL   Very High     Vascular Imaging:LOWER EXTREMITY DOPPLER STUDY   Indications: Ulceration, and Chronic ulcer to RLE forefoot.   High Risk     Hypertension, hyperlipidemia, Diabetes, coronary artery  Factors:     disease.   Other Factors: AFIB, CKD3, PM.  Comparison Study: No previous exams.   Performing Technologist: Rogelia Rohrer RVT, RDMS     Examination Guidelines: A complete evaluation includes at minimum, Doppler  waveform signals and systolic blood pressure reading at the level of  bilateral  brachial, anterior tibial, and posterior tibial arteries, when vessel  segments  are accessible. Bilateral testing is considered an integral part of a  complete  examination. Photoelectric Plethysmograph (PPG) waveforms and toe systolic  pressure readings are included as required and additional duplex testing  as  needed. Limited examinations for reoccurring indications may be performed  as  noted.     ABI Findings:  +---------+------------------+-----+-------------------+----------------+  Right  Rt Pressure (mmHg)IndexWaveform      Comment       +---------+------------------+-----+-------------------+----------------+  Brachial 122           biphasic                +---------+------------------+-----+-------------------+----------------+  PTA               dampened monophasicnon compressible  +---------+------------------+-----+-------------------+----------------+  DP    80        0.62 dampened monophasic           +---------+------------------+-----+-------------------+----------------+  Great Toe0         0.00 Absent                 +---------+------------------+-----+-------------------+----------------+   +---------+------------------+-----+----------+----------------+  Left   Lt Pressure (mmHg)IndexWaveform Comment       +---------+------------------+-----+----------+----------------+  Brachial 130           triphasic           +---------+------------------+-----+----------+----------------+  PTA               biphasic non compressible  +---------+------------------+-----+----------+----------------+  DP    81        0.62 monophasic          +---------+------------------+-----+----------+----------------+  Great Toe47        0.36 Abnormal           +---------+------------------+-----+----------+----------------+   +-------+-----------+-----------+------------+------------+  ABI/TBIToday's ABIToday's TBIPrevious ABIPrevious TBI  +-------+-----------+-----------+------------+------------+  Right 0.62    0                   +-------+-----------+-----------+------------+------------+  Left  0.62    0.36                  +-------+-----------+-----------+------------+------------+       Summary:  Right: Resting right ankle-brachial index indicates moderate right lower  extremity arterial disease. The right toe-brachial index is abnormal.   ABI's likely falsely elevated due to vessel calcification.  Left: Resting left ankle-brachial index indicates moderate left lower  extremity arterial disease. The left toe-brachial index is abnormal.      *See table(s) above for measurements and observations.      Electronically signed by Jamelle Haring on 12/09/2020 at 12:28:38 PM.  [name]. Stanford Breed,  MD [facility] Phone Number: [phone] 12/09/2020 4:46 PM

## 2020-12-09 NOTE — Consult Note (Addendum)
ORTHOPAEDIC CONSULTATION  REQUESTING PHYSICIAN: Modena Jansky, MD  Chief Complaint: Ulcer with draining abscess fifth metatarsal head right foot.  HPI: Shane [name] is a 80 y.o. male who presents with diabetic insensate neuropathy status post CABG at [facility] approximately 4 weeks ago.  Patient also has sleep apnea uses a CPAP machine and has a pacemaker for A. fib.  Past Medical History:  Diagnosis Date  . 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 Cataract   . Chronic kidney disease    d/t DM  . CKD (chronic kidney disease), stage III ([place])   . Diabetes mellitus    Vgo disposal insulin bolus  simular to insulin pump  . Dyspnea   . GERD (gastroesophageal reflux disease)   . Hyperlipidemia   . Hypertension   . Idiopathic pulmonary fibrosis ([place]) 11/2016  . ILD (interstitial lung disease) ([place])   . Moderate aortic stenosis    a. 10/2019 Echo: EF 55-60%, Gr2 DD. Nl RV.   Marland Kitchen Neuromuscular disorder ([place])   . Nonobstructive CAD (coronary artery disease)    a. 2012 Cath: mod, nonobs dzs; b. 10/2016 MV: EF 60%, no ischemia.  . OSA on CPAP 05/05/2017   Unattended Home Sleep Test 7/2/813-AHI 38.6/hour, desaturation to 64%, body weight 261 pounds  . PONV (postoperative nausea and vomiting)   . Sleep apnea     uses cpap asked to bring mask and tubing   Past Surgical History:  Procedure Laterality Date  . ANTERIOR FUSION CERVICAL SPINE  2012  . CARDIAC CATHETERIZATION  2011  . CARDIAC CATHETERIZATION N/A 11/09/2016   Procedure: Right Heart Cath;  Surgeon: Belva Crome, MD;  Location: [place] [facility];  Service: Cardiovascular;  Laterality: N/A;  . carpel tunnel     left wrist  . CATARACT EXTRACTION    . CATARACT EXTRACTION W/ INTRAOCULAR LENS  IMPLANT, BILATERAL  2013  . CERVICAL LAMINECTOMY  2012  . COLONOSCOPY N/A 01/14/2013   Procedure: COLONOSCOPY;  Surgeon: Irene Shipper, MD;  Location: [facility];  Service: Endoscopy;  Laterality: N/A;  . CORONARY ARTERY BYPASS GRAFT  11/04/2020    LIMA-LAD, SVG-OM1, SVG-PDA (Dr Marney [facility]) dc 11/18/2020  . EYE SURGERY    . [place]   left  . LEFT HEART CATH AND CORONARY ANGIOGRAPHY N/A 07/10/2020   Procedure: LEFT HEART CATH AND CORONARY ANGIOGRAPHY;  Surgeon: Sherren Mocha, MD;  Location: [place] [facility];  Service: Cardiovascular;  Laterality: N/A;  . LUMBAR LAMINECTOMY  2003  . LUNG BIOPSY Left 12/26/2016   Procedure: LUNG BIOPSY;  Surgeon: [name], MD;  Location: [place];  Service: Thoracic;  Laterality: Left;  . PACEMAKER IMPLANT N/A 03/30/2020   Procedure: PACEMAKER IMPLANT;  Surgeon: Evans Lance, MD;  Location: [place] [facility];  Service: Cardiovascular;  Laterality: N/A;  . POSTERIOR FUSION CERVICAL SPINE  2012  . TRIGGER FINGER RELEASE  2011   4th finger left hand  . VIDEO ASSISTED THORACOSCOPY Left 12/26/2016   Procedure: VIDEO ASSISTED THORACOSCOPY;  Surgeon: [name], MD;  Location: [place];  Service: Thoracic;  Laterality: Left;  Marland Kitchen VIDEO BRONCHOSCOPY N/A 12/26/2016   Procedure: VIDEO BRONCHOSCOPY;  Surgeon: [name], MD;  Location: [place];  Service: Thoracic;  Laterality: N/A;   Social History   Socioeconomic History  . Marital status: Married    Spouse name: Not on file  . Number of children: Not on file  . Years of education:  Not on file  . Highest education level: Not on file  Occupational History  . Not on file  Tobacco Use  . Smoking status: Never Smoker  . Smokeless tobacco: Never Used  Vaping Use  . Vaping Use: Never used  Substance and Sexual Activity  . Alcohol use: No  . Drug use: No  . Sexual activity: Not Currently  Other Topics Concern  . 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    Social Determinants of Health   Financial Resource Strain: Not on file  Food Insecurity: Not on file  Transportation Needs: Not on file  Physical Activity: Not on file  Stress: Not on file  Social Connections: Not on file    Family History  Problem Relation Age of Onset  . Diabetes Mellitus II Mother   . Emphysema Father 57  . Heart attack Father   . Colon cancer Neg Hx   . Esophageal cancer Neg Hx   . Rectal cancer Neg Hx   . Stomach cancer Neg Hx    - negative except otherwise stated in the family history section Allergies  Allergen Reactions  . Codeine Hives and Itching  . Ofev [Nintedanib] Diarrhea    SEVERE DIARRHEA  . Other Diarrhea and Other (See Comments)    Esbriet causes elevated LFTs. D/C on 06/14/17 and SEVERE DIARRHEA   Prior to Admission medications   Medication Sig Start Date End Date Taking? Authorizing Provider  albuterol (VENTOLIN HFA) 108 (90 Base) MCG/ACT inhaler Inhale 2 puffs into the lungs every 4 (four) hours as needed for wheezing or shortness of breath.   Yes [provider]  Ascorbic Acid (VITAMIN C WITH ROSE HIPS) 500 MG tablet Take 500 mg by mouth daily.   Yes [provider]  aspirin EC 81 MG tablet Take 81 mg by mouth daily.   Yes [provider]  atorvastatin (LIPITOR) 40 MG tablet Take 40 mg by mouth at bedtime. 11/16/20  Yes [provider]  clotrimazole-betamethasone (LOTRISONE) cream Apply 1 application topically daily as needed (for skin infection).  01/16/17  Yes [provider]  [employer] ACETAMINOPHEN 325 MG tablet Take 650 mg by mouth daily as needed for pain. 11/15/20  Yes [provider]  EPINEPHrine 0.3 mg/0.3 mL IJ SOAJ injection Inject 0.3 mg into the muscle once as needed for anaphylaxis (max 3 doses). 01/30/18  Yes [provider]  furosemide (LASIX) 40 MG tablet Take 40 mg by mouth daily. 02/15/18  Yes [provider]  insulin lispro (HUMALOG) 100 UNIT/ML injection Inject 22 Units into the skin 3 (three) times daily with meals.   Yes [provider]  liver oil-zinc oxide (DESITIN) 40 % ointment Apply 1 application topically in the morning and at bedtime.   Yes [provider]   LORazepam (ATIVAN) 1 MG tablet Take 1 mg by mouth at bedtime as needed for sleep.  03/20/20  Yes [provider]  losartan (COZAAR) 50 MG tablet Take 50 mg by mouth daily.   Yes [provider]  magnesium oxide (MAG-OX) 400 MG tablet TAKE 1 CAPSULE BY MOUTH ONCE DAILY Patient taking differently: Take 400 mg by mouth daily. 08/11/20  Yes Brand Males, MD  metoprolol tartrate (LOPRESSOR) 50 MG tablet Take 25 mg by mouth 2 (two) times daily. 11/16/20  Yes [provider]  Multiple Vitamin (MULTIVITAMIN WITH MINERALS) TABS tablet Take 1 tablet by mouth daily.   Yes [provider]  pantoprazole (PROTONIX) 40 MG tablet Take 40 mg by mouth daily.   Yes [provider]  polyethylene glycol powder (GLYCOLAX/MIRALAX) 17 GM/SCOOP powder Take 17 g by mouth daily. 11/15/20  Yes [provider]  potassium chloride SA (KLOR-CON) 20 MEQ tablet Take 20-40 mEq by mouth See admin instructions. Takes 2 tablets (40 mEq totally) by mouth for 7 days; then takes 1 tablet (20 mEq totally) by mouth daily after that   Yes [provider]  TRESIBA FLEXTOUCH 200 UNIT/ML FlexTouch Pen Inject 34 Units into the skin at bedtime. 11/23/20  Yes [provider]  apixaban (ELIQUIS) 5 MG TABS tablet Take 1 tablet (5 mg total) by mouth 2 (two) times daily. 12/08/20   Evans Lance, MD  Continuous Blood Gluc Sensor (FREESTYLE LIBRE 14 DAY SENSOR) MISC 1 patch every 14 (fourteen) days.    [provider]   CT CHEST WO CONTRAST  Result Date: 12/08/2020 CLINICAL DATA:  80 year old male with idiopathic pulmonary fibrosis. Concern for pneumonia or abscess. EXAM: CT CHEST WITHOUT CONTRAST TECHNIQUE: Multidetector CT imaging of the chest was performed following the standard protocol without IV contrast. COMPARISON:  Chest CT dated 09/30/2019. FINDINGS: Evaluation of this exam is limited in the absence of intravenous contrast. Cardiovascular: There is mild  cardiomegaly. No significant pericardial effusion. Three-vessel coronary vascular calcification and postsurgical changes of CABG. Left pectoral pacemaker device. There is mild atherosclerotic calcification of the thoracic aorta. There is dilatation of the main pulmonary trunk suggestive of a degree of pulmonary hypertension. Clinical correlation is recommended Mediastinum/Nodes: No hilar or mediastinal adenopathy. The esophagus is grossly unremarkable. No mediastinal fluid collection. Lungs/Pleura: Diffuse bilateral interstitial coarsening as seen previously in keeping with history of pulmonary fibrosis. No new consolidation. There is no pleural effusion pneumothorax. The central airways are patent. Upper Abdomen: Slight irregularity of the liver contour may represent early changes of cirrhosis. Musculoskeletal: Median sternotomy wires. Degenerative changes of the spine. No acute osseous pathology. Partially visualized cervical fusion. IMPRESSION: 1. No acute intrathoracic pathology. 2. Pulmonary fibrosis. 3. Mild cardiomegaly with three-vessel coronary vascular calcification and postsurgical changes of CABG. 4. Aortic Atherosclerosis (ICD10-I70.0). Electronically Signed   By: Anner Crete M.D.   On: 12/08/2020 22:39   DG Chest Port 1 View  Result Date: 12/08/2020 CLINICAL DATA:  Shortness of breath EXAM: PORTABLE CHEST 1 VIEW COMPARISON:  07/17/2020, CT 09/30/2019, 03/31/2020, 04/27/2018 FINDINGS: Surgical hardware in the cervical spine. Left-sided pacing device as before. Interval post sternotomy changes. Diffuse bilateral left greater than right reticular and ground-glass opacity, likely corresponding to history of pulmonary fibrosis. Chronic pleural scarring on the left. No acute airspace consolidation or pneumothorax. Stable cardiomediastinal silhouette. IMPRESSION: 1. Stable radiographic appearance of the chest with pulmonary fibrosis. No acute airspace disease. Electronically Signed   By: Donavan Foil  M.D.   On: 12/08/2020 19:05   CUP PACEART INCLINIC DEVICE CHECK  Result Date: 12/08/2020 Pacemaker check in clinic. Normal device function. Thresholds, sensing, impedances consistent with previous measurements. Device programmed to maximize longevity. AT/AF burden 24%, appear AF w/ controlled VR. No OAC on file. Patient will start Eliquis and come back 01/06/21 to attempt in office ATP. No high ventricular rates noted. Device programmed at appropriate safety margins. Histogram distribution appropriate for patient activity level. Device programmed to optimize intrinsic conduction. Estimated longevity 8 years 11 months. Patient enrolled in remote follow-up. Patient education completed. ROV w/ Dr. Lovena Le 01/06/21.Lavenia Atlas, BSN, RN  - pertinent xrays, CT, MRI studies were reviewed and  independently interpreted  Positive ROS: All other systems have been reviewed and were otherwise negative with the exception of those mentioned in the HPI and as above.  Physical Exam: General: Alert, no acute distress Psychiatric: Patient is competent for consent with normal mood and affect Lymphatic: No axillary or cervical lymphadenopathy Cardiovascular: No pedal edema Respiratory: No cyanosis, no use of accessory musculature GI: No organomegaly, abdomen is soft and non-tender    Images:  @ENCIMAGES @  Labs:  Lab Results  Component Value Date   HGBA1C 6.6 (H) 12/09/2020   HGBA1C 7.6 (H) 03/28/2020   HGBA1C 7.9 (H) 12/19/2016   ESRSEDRATE 22 (H) 10/14/2016   CRP 8.3 (H) 12/09/2020   CRP 4.1 (H) 07/17/2020   REPTSTATUS PENDING 12/08/2020   REPTSTATUS PENDING 12/08/2020   CULT  12/08/2020    NO GROWTH < 12 HOURS Performed at [facility], [place] [address], [place], [place] [address]    CULT  12/08/2020    NO GROWTH < 12 HOURS Performed at [facility] [address], [place], [place] [address]     Lab Results  Component Value Date   ALBUMIN 2.5 (L) 12/09/2020   ALBUMIN 3.0 (L)  12/08/2020   ALBUMIN 3.3 (L) 07/17/2020    Neurologic: Patient does not have protective sensation bilateral lower extremities.   MUSCULOSKELETAL:   Skin: Examination patient has cellulitis around the fifth metatarsal head right foot.  There is a necrotic ulcer over the fifth metatarsal head with purulent drainage.  I cannot palpate a dorsalis pedis or posterior tibial pulse he does have venous swelling in both legs but no ascending cellulitis.  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  Patient states this has been present for 10 years since cervical discectomy and fusion 10 years ago.Correction of his foot to neutral is difficult due to the chronic nature of the foot drop.  Patient has blistering skin on the right leg but no full-thickness ulcer.  Assessment: Assessment: Diabetic insensate neuropathy with peripheral vascular disease with abscess and ulceration right foot fifth metatarsal head with a foot drop and cavovarus deformity of the right foot.  Plan: Plan: Ankle-brachial indices are ordered, I ordered a CT scan due to his pacemeaker.  Discussed with the patient and his wife that patient may require 5th ray amputation.  We will need to review the CT scan and ABIs first.  We will place the patient in a PRAFO to protect his right heel and forefoot.  Thank you for the consult and the opportunity to see Mr. Leonides Cave, MD [facility] [phone] 9:51 AM

## 2020-12-09 NOTE — Consult Note (Addendum)
Cardiology Consultation:   Patient ID: Sasuke Yaffe MRN: [id number]; DOB: 25-Nov-1941  Admit date: 12/08/2020 Date of Consult: 12/09/2020  Primary Care Provider: Burnard Bunting, MD [facility] Cardiologist: Kirk Ruths, MD  [facility] Electrophysiologist:  Cristopher Peru, MD    Patient Profile:   Ezrael Sam is a 80 y.o. male with a hx of ideopathic Pulmonary fibrosis (Dr. Chase Caller, gets infusions of pamrevlumab every 3 weeks), sleep apnea,  CKD (III), DM, HTN, HLD, OSA w/CPAP, advanced heart block w/PPM, CAD(CABG last month) VHD (mod AS), who is being seen today for the evaluation of AFib at the request of Dr. Meda Coffee.  History of Present Illness:   Mr. Danzer had a catheterization done in August 2021 that showed 60% left main, 50% proximal LAD, 70% mid LAD, 90% apical LAD, 80% ostial circumflex, and a 90% proximal RCA.  He does have calcific aortic stenosis that appears to be moderate.   He also contracted COVID in August and was hospitalized for a few days at [facility].   The patient was seen by Dr. Cyndia Bent on 08/13/2020.  He recommended CABG and AVR.  He feels the patient is at increased risk but that his long-term outlook without revascularization is poor. The patient was considering his options when he last saw cardiology outpt by L. Kilroy P on 09/09/20, he noted Dr. [name] had referred him to [facility] for second opinion Imdur was added.  He underwent CABG (LIMA-LAD, SVG-Om1, SVG-PDA) on 11/05/20 at [facility] by Dr. Lincoln Brigham, discharged 11/16/20.  ( I do not see any mention of AFib during this admission)  Device clinic noted recently new AFlutter via device alert 11/30/20, planned to come in for visit and review, Der. Lovena Le mentioned possible ATP vs DCCV as a recommendation. He was seen in the office by device RN yesterday  Found in AFib w/CVR and started on Eliquis with plans to return in a couple weeks to visit DCCV plans.  Once home started to feel poorly, generalized  malaise, chills, weak, took a 3hr nap, woke and was too weak to stand to use the bathroom, ongoing chills, temp was >101 by pt report and EMS called and was admitted yesterday for fever w/u and cardiology consulted for new AFib (rate controlled).  Cardiology has started Eliquis and consulted EP for consideration of DCCV vs ATP. Admitting MD suspected pt to be volume depleted and started gentle hydration, and w/u for reports of fever at home, and symptoms of illness.  99.1-100.1 last 24hrs BP looks generally OK, with some outlyer hypotensive readings in his vital  LABS K+ 4.6 BUN/Creat 37 1/58 > 37/1.75 Mag 1.9 Albumin 2.5 BNP 195 HS Trop 27 > 30 > 33 CRP 8.3 Lactic acid 2.1 > 1.5 WBC 10.2 > 8.2 H/H 10.7/32.4 Plts 218 TSH 1.928  RESP panel/COVID negative  The patient feels a bit better currently.  Not as weak feeling. Has not had any kind of CP, palpitations or cardiac awareness, feels like he has been recovering well from his CABG.  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 He reports when he was first called about his heart rhythm a couple days ago he felt fine and was suprised to hear it. Yesterday at our office he also felt fine, slept poorly the night before and did feel a couple chills but all in all felt ok.  On the way home he started feeling more fatigued  and tired, once home napped and upon waking hours later felt awful, extremely weak, had shaking chills, was very nauseous but did not vomit.  His wife at bedside mentions temp by EMS was 100.4 and by their home thermometer was 100 or so, not 101.   Device information  Biotronik PPM implanted 03/30/20 Interrogation from yesterday is reviewed Initial AF detection was 11/26/20 In review with industry, has been 100% AF since 12/02/20   Past Medical History:  Diagnosis Date  . 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 Cataract   . Chronic kidney disease    d/t DM  . CKD  (chronic kidney disease), stage III ([place])   . Diabetes mellitus    Vgo disposal insulin bolus  simular to insulin pump  . Dyspnea   . GERD (gastroesophageal reflux disease)   . Hyperlipidemia   . Hypertension   . Idiopathic pulmonary fibrosis ([place]) 11/2016  . ILD (interstitial lung disease) ([place])   . Moderate aortic stenosis    a. 10/2019 Echo: EF 55-60%, Gr2 DD. Nl RV.   Marland Kitchen Neuromuscular disorder ([place])   . Nonobstructive CAD (coronary artery disease)    a. 2012 Cath: mod, nonobs dzs; b. 10/2016 MV: EF 60%, no ischemia.  . OSA on CPAP 05/05/2017   Unattended Home Sleep Test 7/2/813-AHI 38.6/hour, desaturation to 64%, body weight 261 pounds  . PONV (postoperative nausea and vomiting)   . Sleep apnea     uses cpap asked to bring mask and tubing    Past Surgical History:  Procedure Laterality Date  . ANTERIOR FUSION CERVICAL SPINE  2012  . CARDIAC CATHETERIZATION  2011  . CARDIAC CATHETERIZATION N/A 11/09/2016   Procedure: Right Heart Cath;  Surgeon: Belva Crome, MD;  Location: [place] [facility];  Service: Cardiovascular;  Laterality: N/A;  . carpel tunnel     left wrist  . CATARACT EXTRACTION    . CATARACT EXTRACTION W/ INTRAOCULAR LENS  IMPLANT, BILATERAL  2013  . CERVICAL LAMINECTOMY  2012  . COLONOSCOPY N/A 01/14/2013   Procedure: COLONOSCOPY;  Surgeon: Irene Shipper, MD;  Location: [facility];  Service: Endoscopy;  Laterality: N/A;  . CORONARY ARTERY BYPASS GRAFT  11/04/2020   LIMA-LAD, SVG-OM1, SVG-PDA (Dr Marney [facility]) dc 11/18/2020  . EYE SURGERY    . [place]   left  . LEFT HEART CATH AND CORONARY ANGIOGRAPHY N/A 07/10/2020   Procedure: LEFT HEART CATH AND CORONARY ANGIOGRAPHY;  Surgeon: Sherren Mocha, MD;  Location: [place] [facility];  Service: Cardiovascular;  Laterality: N/A;  . LUMBAR LAMINECTOMY  2003  . LUNG BIOPSY Left 12/26/2016   Procedure: LUNG BIOPSY;  Surgeon: [name], MD;  Location: [place];  Service: Thoracic;  Laterality: Left;   . PACEMAKER IMPLANT N/A 03/30/2020   Procedure: PACEMAKER IMPLANT;  Surgeon: Evans Lance, MD;  Location: [place] [facility];  Service: Cardiovascular;  Laterality: N/A;  . POSTERIOR FUSION CERVICAL SPINE  2012  . TRIGGER FINGER RELEASE  2011   4th finger left hand  . VIDEO ASSISTED THORACOSCOPY Left 12/26/2016   Procedure: VIDEO ASSISTED THORACOSCOPY;  Surgeon: [name], MD;  Location: [place];  Service: Thoracic;  Laterality: Left;  Marland Kitchen VIDEO BRONCHOSCOPY N/A 12/26/2016   Procedure: VIDEO BRONCHOSCOPY;  Surgeon: [name], MD;  Location: [place];  Service: Thoracic;  Laterality: N/A;     Home Medications:  Prior to Admission medications   Medication Sig Start Date End Date Taking?  Authorizing Provider  albuterol (VENTOLIN HFA) 108 (90 Base) MCG/ACT inhaler Inhale 2 puffs into the lungs every 4 (four) hours as needed for wheezing or shortness of breath.   Yes [provider]  Ascorbic Acid (VITAMIN C WITH ROSE HIPS) 500 MG tablet Take 500 mg by mouth daily.   Yes [provider]  aspirin EC 81 MG tablet Take 81 mg by mouth daily.   Yes [provider]  atorvastatin (LIPITOR) 40 MG tablet Take 40 mg by mouth at bedtime. 11/16/20  Yes [provider]  clotrimazole-betamethasone (LOTRISONE) cream Apply 1 application topically daily as needed (for skin infection).  01/16/17  Yes [provider]  [employer] ACETAMINOPHEN 325 MG tablet Take 650 mg by mouth daily as needed for pain. 11/15/20  Yes [provider]  EPINEPHrine 0.3 mg/0.3 mL IJ SOAJ injection Inject 0.3 mg into the muscle once as needed for anaphylaxis (max 3 doses). 01/30/18  Yes [provider]  furosemide (LASIX) 40 MG tablet Take 40 mg by mouth daily. 02/15/18  Yes [provider]  insulin lispro (HUMALOG) 100 UNIT/ML injection Inject 22 Units into the skin 3 (three) times daily with meals.   Yes [provider]  liver oil-zinc oxide (DESITIN)  40 % ointment Apply 1 application topically in the morning and at bedtime.   Yes [provider]  LORazepam (ATIVAN) 1 MG tablet Take 1 mg by mouth at bedtime as needed for sleep.  03/20/20  Yes [provider]  losartan (COZAAR) 50 MG tablet Take 50 mg by mouth daily.   Yes [provider]  magnesium oxide (MAG-OX) 400 MG tablet TAKE 1 CAPSULE BY MOUTH ONCE DAILY Patient taking differently: Take 400 mg by mouth daily. 08/11/20  Yes Brand Males, MD  metoprolol tartrate (LOPRESSOR) 50 MG tablet Take 25 mg by mouth 2 (two) times daily. 11/16/20  Yes [provider]  Multiple Vitamin (MULTIVITAMIN WITH MINERALS) TABS tablet Take 1 tablet by mouth daily.   Yes [provider]  pantoprazole (PROTONIX) 40 MG tablet Take 40 mg by mouth daily.   Yes [provider]  polyethylene glycol powder (GLYCOLAX/MIRALAX) 17 GM/SCOOP powder Take 17 g by mouth daily. 11/15/20  Yes [provider]  potassium chloride SA (KLOR-CON) 20 MEQ tablet Take 20-40 mEq by mouth See admin instructions. Takes 2 tablets (40 mEq totally) by mouth for 7 days; then takes 1 tablet (20 mEq totally) by mouth daily after that   Yes [provider]  TRESIBA FLEXTOUCH 200 UNIT/ML FlexTouch Pen Inject 34 Units into the skin at bedtime. 11/23/20  Yes [provider]  apixaban (ELIQUIS) 5 MG TABS tablet Take 1 tablet (5 mg total) by mouth 2 (two) times daily. 12/08/20   Evans Lance, MD  Continuous Blood Gluc Sensor (FREESTYLE LIBRE 14 DAY SENSOR) MISC 1 patch every 14 (fourteen) days.    [provider]    Inpatient Medications: Scheduled Meds: . apixaban  5 mg Oral BID  . aspirin EC  81 mg Oral Daily  . atorvastatin  40 mg Oral Daily  . insulin aspart  0-15 Units Subcutaneous TID AC & HS  . insulin aspart  10 Units Subcutaneous TID WC  . insulin detemir  20 Units Subcutaneous QHS  . metoprolol tartrate  50 mg Oral BID  . pantoprazole  40 mg  Oral Daily   Continuous Infusions:  PRN Meds: acetaminophen, albuterol, hydrALAZINE, ketotifen, LORazepam, ondansetron (ZOFRAN) IV, polyethylene glycol, prochlorperazine  Allergies:  Allergies  Allergen Reactions  . Codeine Hives and Itching  . Ofev [Nintedanib] Diarrhea    SEVERE DIARRHEA  . Other Diarrhea and Other (See Comments)    Esbriet causes elevated LFTs. D/C on 06/14/17 and SEVERE DIARRHEA    Social History:   Social History   Socioeconomic History  . Marital status: Married    Spouse name: Not on file  . Number of children: Not on file  . Years of education: Not on file  . Highest education level: Not on file  Occupational History  . Not on file  Tobacco Use  . Smoking status: Never Smoker  . Smokeless tobacco: Never Used  Vaping Use  . Vaping Use: Never used  Substance and Sexual Activity  . Alcohol use: No  . Drug use: No  . Sexual activity: Not Currently  Other Topics Concern  . 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    Social Determinants of Health   Financial Resource Strain: Not on file  Food Insecurity: Not on file  Transportation Needs: Not on file  Physical Activity: Not on file  Stress: Not on file  Social Connections: Not on file  Intimate Partner Violence: Not on file    Family History:   Family History  Problem Relation Age of Onset  . Diabetes Mellitus II Mother   . Emphysema Father 28  . Heart attack Father   . Colon cancer Neg Hx   . Esophageal cancer Neg Hx   . Rectal cancer Neg Hx   . Stomach cancer Neg Hx      ROS:  Please see the history of present illness.  All other ROS reviewed and negative.     Physical Exam/Data:   Vitals:   12/09/20 0700 12/09/20 0730 12/09/20 0800 12/09/20 0830  BP: (!) 103/45 131/60 (!) 104/39   Pulse: 62 62 (!) 59 65  Resp: (!) 21 (!) 23 19 (!) 28  Temp:    99.3 F (37.4 C)  TempSrc:    Axillary  SpO2: 94% 99% 94% 97%  Weight:      Height:         Intake/Output Summary (Last 24 hours) at 12/09/2020 0949 Last data filed at 12/09/2020 7673 Gross per 24 hour  Intake 1243.33 ml  Output 120 ml  Net 1123.33 ml   Last 3 Weights 12/08/2020 12/08/2020 09/09/2020  Weight (lbs) 246 lb 14.6 oz 247 lb 264 lb  Weight (kg) 112 kg 112.038 kg 119.75 kg     Body mass index is 33.49 kg/m.  General:  Well nourished, well developed, in no acute distress HEENT: normal Lymph: no adenopathy Neck: no JVD Endocrine:  No thryomegaly Vascular: No carotid bruits Cardiac:  irreg-irreg, 2+/SM; no gallops or rubs Lungs: CTA b/l, no wheezing, rhonchi or rales  Abd: soft, nontender Ext: trace edema, he has a tiny scabbed wound R 5th toe and a larger wound dime--sized to the lateral aspect of his foot with surrounding erythema/warmth Musculoskeletal:  R foot is chronically deviated medially and in a brace Skin: warm and dry  Neuro: no gross  focal abnormalities noted Psych:  Normal affect   EKG:  The EKG was personally reviewed and demonstrates:   AFlutter (atypical) vs coarse AFib 94bpm, RBBB 07/17/20: ST, 101, RBBB  Telemetry:  Telemetry was personally reviewed and demonstrates:   AFlutter , V rates 60's with intermittent V pacing     Relevant CV Studies:  11/04/20: inter-op  TEE ([facility]) INTERPRETATION  Pre bypass TEE: mild RV dysfunction, normal LV function, estimated EF 50%, measured EF 48.8%, mild-mod AS, AVA 1.4 cm2, AV calcification partially fused left and right coronary cusps, mild MR, no PFO,   Post op TEE: LVEF 55% by 3D, RV dilation, mild-mod RV dysfunction, moderate AS, moderate MR, mild TR, no PFO      Laboratory Data:  High Sensitivity Troponin:   Recent Labs  Lab 12/08/20 1805 12/08/20 2045 12/09/20 0304  TROPONINIHS 27* 30* 33*     Chemistry Recent Labs  Lab 12/08/20 1805 12/09/20 0304  NA 131* 129*  K 4.5 4.6  CL 92* 93*  CO2 26 24  GLUCOSE 143* 153*  BUN 37* 37*  CREATININE 1.58* 1.75*  CALCIUM 9.3 8.4*   GFRNONAA 44* 39*  ANIONGAP 13 12    Recent Labs  Lab 12/08/20 1805 12/09/20 0304  PROT 7.3 6.5  ALBUMIN 3.0* 2.5*  AST 20 19  ALT 19 17  ALKPHOS 90 80  BILITOT 1.2 1.0   Hematology Recent Labs  Lab 12/08/20 1805 12/09/20 0304  WBC 10.2 8.2  RBC 3.77* 3.43*  HGB 11.9* 10.7*  HCT 35.8* 32.4*  MCV 95.0 94.5  MCH 31.6 31.2  MCHC 33.2 33.0  RDW 13.4 13.4  PLT 257 218   BNP Recent Labs  Lab 12/08/20 1805  BNP 195.4*    DDimer No results for input(s): DDIMER in the last 168 hours.   Radiology/Studies:   CT CHEST WO CONTRAST Result Date: 12/08/2020 CLINICAL DATA:  80 year old male with idiopathic pulmonary fibrosis. Concern for pneumonia or abscess. EXAM: CT CHEST WITHOUT CONTRAST TECHNIQUE: Multidetector CT imaging of the chest was performed following the standard protocol without IV contrast. COMPARISON:  Chest CT dated 09/30/2019. FINDINGS: Evaluation of this exam is limited in the absence of intravenous contrast. Cardiovascular: There is mild cardiomegaly. No significant pericardial effusion. Three-vessel coronary vascular calcification and postsurgical changes of CABG. Left pectoral pacemaker device. There is mild atherosclerotic calcification of the thoracic aorta. There is dilatation of the main pulmonary trunk suggestive of a degree of pulmonary hypertension. Clinical correlation is recommended Mediastinum/Nodes: No hilar or mediastinal adenopathy. The esophagus is grossly unremarkable. No mediastinal fluid collection. Lungs/Pleura: Diffuse bilateral interstitial coarsening as seen previously in keeping with history of pulmonary fibrosis. No new consolidation. There is no pleural effusion pneumothorax. The central airways are patent. Upper Abdomen: Slight irregularity of the liver contour may represent early changes of cirrhosis. Musculoskeletal: Median sternotomy wires. Degenerative changes of the spine. No acute osseous pathology. Partially visualized cervical fusion.  IMPRESSION: 1. No acute intrathoracic pathology. 2. Pulmonary fibrosis. 3. Mild cardiomegaly with three-vessel coronary vascular calcification and postsurgical changes of CABG. 4. Aortic Atherosclerosis (ICD10-I70.0). Electronically Signed   By: Anner Crete M.D.   On: 12/08/2020 22:39   DG Chest Port 1 View Result Date: 12/08/2020 CLINICAL DATA:  Shortness of breath EXAM: PORTABLE CHEST 1 VIEW COMPARISON:  07/17/2020, CT 09/30/2019, 03/31/2020, 04/27/2018 FINDINGS: Surgical hardware in the cervical spine. Left-sided pacing device as before. Interval post sternotomy changes. Diffuse bilateral left greater than right reticular and ground-glass opacity, likely corresponding to history of pulmonary fibrosis. Chronic pleural scarring on the left. No acute airspace consolidation or pneumothorax. Stable cardiomediastinal silhouette. IMPRESSION: 1. Stable radiographic appearance of the chest with pulmonary fibrosis. No acute airspace disease. Electronically Signed   By: Donavan Foil M.D.   On: 12/08/2020 19:05    Assessment and Plan:   1. New onset AFib/flutter  CHA2DS2Vasc is 5, started on Eliquis, 1st dose last night     Not unusual post CABG     rate controlled      I do not think his rate controlled flutter is playing a role in his acute illness/symptoms. In review of his PPM interrogation with industry he has been in/out of AFl since 11/26/20 and has had 100% burden since 12/02/20  We reviewed AFlutter and rational for [facility], discussed strategies for rhythm control TEE/DCCV tomorrow or 3 weeks of uninterrupted [place] and outpt DCCV He is inclined to skip the TEE and plan for DCCV in 3 weeks. I think this is reasonable, if he has infection and his rate is controlled, waiting until the infection is cleared is perhaps the better strategy.   2. Generalized malaise, weakness 3. Reports of fever at home of 101     Low grade here 100.2     S/p CABG 11/04/20     CT chest no acute findings     BC  drawn     UA looked OK  He has a wound on his R foot that the patient mentions his orthopedic MD has been keeping an eye on, it has surrounding erythema they report only a couple weeks ago was the size of the tiny wound that is on his toe and has gotten larger over the weeks. They also report a sacral wound since his CABG hospital stay (that I did not personally evaluate)  I Marisela Line defer further evaluation and management to the medicine team.   4. Suspect to be volume depleted     IM team addressing       5. CAD      Recent CABG      No anginal symptoms, reports recovering well at home until yesterday   {Dr. Curt Bears Zachry Hopfensperger see later today    For questions or updates, please contact [facility] Please consult [email or web address] for contact info under    Signed, Baldwin Jamaica, PA-C  12/09/2020 9:49 AM  I have seen and examined this patient with Tommye Standard.  Agree with above, note added to reflect my findings.  On exam, RRR, no murmurs.  Patient presents with atrial flutter, chills and fatigue.  He is also had fevers.  At this point I do not feel that his atrial flutter is contributing to his overall symptomatology.  He has blood cultures that are pending.  Atrial flutter was found on 1229.  He got 1 dose of anticoagulation.  He would not be appropriate for cardioversion nor ATP.  He would need a TEE prior to cardioversion, though the patient is not interested in this.  He would like to wait for 3 weeks of anticoagulation and have cardioversion performed then.  We Cyrene Gharibian continue to monitor.  Arnice Vanepps M. Alta Shober MD 12/09/2020 1:20 PM

## 2020-12-10 ENCOUNTER — Encounter (HOSPITAL_COMMUNITY)
Admission: EM | Disposition: A | Discharge status: Home Health | Payer: Self-pay | Source: Home / Self Care | Attending: Internal Medicine

## 2020-12-10 DIAGNOSIS — I482 Chronic atrial fibrillation, unspecified: Secondary | ICD-10-CM

## 2020-12-10 DIAGNOSIS — L97519 Non-pressure chronic ulcer of other part of right foot with unspecified severity: Secondary | ICD-10-CM

## 2020-12-10 DIAGNOSIS — E860 Dehydration: Secondary | ICD-10-CM | POA: Diagnosis not present

## 2020-12-10 DIAGNOSIS — E871 Hypo-osmolality and hyponatremia: Secondary | ICD-10-CM | POA: Diagnosis not present

## 2020-12-10 DIAGNOSIS — R7881 Bacteremia: Secondary | ICD-10-CM

## 2020-12-10 DIAGNOSIS — Z959 Presence of cardiac and vascular implant and graft, unspecified: Secondary | ICD-10-CM

## 2020-12-10 DIAGNOSIS — B9561 Methicillin susceptible Staphylococcus aureus infection as the cause of diseases classified elsewhere: Secondary | ICD-10-CM

## 2020-12-10 DIAGNOSIS — R531 Weakness: Secondary | ICD-10-CM | POA: Diagnosis not present

## 2020-12-10 DIAGNOSIS — E13621 Other specified diabetes mellitus with foot ulcer: Secondary | ICD-10-CM | POA: Diagnosis not present

## 2020-12-10 HISTORY — PX: ABDOMINAL AORTOGRAM W/LOWER EXTREMITY: CATH118223

## 2020-12-10 HISTORY — PX: PERIPHERAL VASCULAR INTERVENTION: CATH118257

## 2020-12-10 LAB — BASIC METABOLIC PANEL
Anion gap: 10 (ref 5–15)
BUN: 31 mg/dL — ABNORMAL HIGH (ref 8–23)
CO2: 25 mmol/L (ref 22–32)
Calcium: 7.9 mg/dL — ABNORMAL LOW (ref 8.9–10.3)
Chloride: 96 mmol/L — ABNORMAL LOW (ref 98–111)
Creatinine, Ser: 1.78 mg/dL — ABNORMAL HIGH (ref 0.61–1.24)
GFR, Estimated: 38 mL/min — ABNORMAL LOW (ref >60)
Glucose, Bld: 70 mg/dL (ref 70–99)
Potassium: 4.2 mmol/L (ref 3.5–5.1)
Sodium: 131 mmol/L — ABNORMAL LOW (ref 135–145)

## 2020-12-10 LAB — GLUCOSE, CAPILLARY
Glucose-Capillary: 61 mg/dL — ABNORMAL LOW (ref 70–99)
Glucose-Capillary: 101 mg/dL — ABNORMAL HIGH (ref 70–99)
Glucose-Capillary: 90 mg/dL (ref 70–99)
Glucose-Capillary: 73 mg/dL (ref 70–99)
Glucose-Capillary: 129 mg/dL — ABNORMAL HIGH (ref 70–99)

## 2020-12-10 LAB — CBC
HCT: 28.4 % — ABNORMAL LOW (ref 39.0–52.0)
Hemoglobin: 9.6 g/dL — ABNORMAL LOW (ref 13.0–17.0)
MCH: 31.6 pg (ref 26.0–34.0)
MCHC: 33.8 g/dL (ref 30.0–36.0)
MCV: 93.4 fL (ref 80.0–100.0)
Platelets: 198 10*3/uL (ref 150–400)
RBC: 3.04 MIL/uL — ABNORMAL LOW (ref 4.22–5.81)
RDW: 13.2 % (ref 11.5–15.5)
WBC: 11.9 10*3/uL — ABNORMAL HIGH (ref 4.0–10.5)
nRBC: 0 % (ref 0.0–0.2)

## 2020-12-10 LAB — POCT ACTIVATED CLOTTING TIME: Activated Clotting Time: 267 seconds

## 2020-12-10 SURGERY — ABDOMINAL AORTOGRAM W/LOWER EXTREMITY
Anesthesia: LOCAL | Laterality: Right

## 2020-12-10 MED ORDER — SODIUM CHLORIDE 0.9 % IV SOLN: INTRAVENOUS | Status: DC

## 2020-12-10 MED ORDER — INSULIN DETEMIR 100 UNIT/ML ~~LOC~~ SOLN
15.0000 [IU] | Freq: Every day | SUBCUTANEOUS | Status: DC
  Administered 2020-12-11 – 2020-12-15 (×6): 15 [IU] via SUBCUTANEOUS
  Filled 2020-12-10 (×9): qty 0.15

## 2020-12-10 MED ORDER — LIDOCAINE HCL (PF) 1 % IJ SOLN
INTRAMUSCULAR | Status: DC | PRN
  Administered 2020-12-10: 15 mL via INTRADERMAL

## 2020-12-10 MED ORDER — INSULIN ASPART 100 UNIT/ML ~~LOC~~ SOLN
7.0000 [IU] | Freq: Three times a day (TID) | SUBCUTANEOUS | Status: DC
  Administered 2020-12-11 – 2020-12-16 (×13): 7 [IU] via SUBCUTANEOUS

## 2020-12-10 MED ORDER — HEPARIN (PORCINE) IN NACL 1000-0.9 UT/500ML-% IV SOLN
INTRAVENOUS | Status: DC | PRN
  Administered 2020-12-10 (×2): 500 mL

## 2020-12-10 MED ORDER — HEPARIN (PORCINE) IN NACL 1000-0.9 UT/500ML-% IV SOLN
INTRAVENOUS | Status: AC
  Filled 2020-12-10: qty 1000

## 2020-12-10 MED ORDER — DEXTROSE 50 % IV SOLN
INTRAVENOUS | Status: AC
  Filled 2020-12-10: qty 50

## 2020-12-10 MED ORDER — SODIUM CHLORIDE 0.9% FLUSH: 3.0000 mL | INTRAVENOUS | Status: DC | PRN

## 2020-12-10 MED ORDER — CLOPIDOGREL BISULFATE 300 MG PO TABS
ORAL_TABLET | ORAL | Status: AC
  Filled 2020-12-10: qty 1

## 2020-12-10 MED ORDER — CLOPIDOGREL BISULFATE 300 MG PO TABS
ORAL_TABLET | ORAL | Status: DC | PRN
  Administered 2020-12-10: 300 mg via ORAL

## 2020-12-10 MED ORDER — SODIUM CHLORIDE 0.9 % IV SOLN
250.0000 mL | INTRAVENOUS | Status: DC | PRN
  Administered 2020-12-14: 250 mL via INTRAVENOUS

## 2020-12-10 MED ORDER — CLOPIDOGREL BISULFATE 75 MG PO TABS: 300.0000 mg | ORAL_TABLET | Freq: Once | ORAL | Status: DC

## 2020-12-10 MED ORDER — HEPARIN SODIUM (PORCINE) 1000 UNIT/ML IJ SOLN
INTRAMUSCULAR | Status: DC | PRN
  Administered 2020-12-10: 10000 [IU] via INTRAVENOUS

## 2020-12-10 MED ORDER — SODIUM CHLORIDE 0.9 % WEIGHT BASED INFUSION
1.0000 mL/kg/h | INTRAVENOUS | Status: AC
  Administered 2020-12-11 (×2): 1 mL/kg/h via INTRAVENOUS

## 2020-12-10 MED ORDER — SODIUM CHLORIDE 0.9% FLUSH
3.0000 mL | Freq: Two times a day (BID) | INTRAVENOUS | Status: DC
  Administered 2020-12-12 – 2020-12-16 (×8): 3 mL via INTRAVENOUS

## 2020-12-10 MED ORDER — INSULIN ASPART 100 UNIT/ML ~~LOC~~ SOLN
0.0000 [IU] | Freq: Three times a day (TID) | SUBCUTANEOUS | Status: DC
  Administered 2020-12-12: 3 [IU] via SUBCUTANEOUS
  Administered 2020-12-12 (×2): 1 [IU] via SUBCUTANEOUS
  Administered 2020-12-13: 2 [IU] via SUBCUTANEOUS
  Administered 2020-12-13 – 2020-12-16 (×4): 1 [IU] via SUBCUTANEOUS

## 2020-12-10 MED ORDER — DEXTROSE 50 % IV SOLN
12.5000 g | INTRAVENOUS | Status: AC
  Administered 2020-12-10: 12.5 g via INTRAVENOUS

## 2020-12-10 MED ORDER — ACETAMINOPHEN 325 MG PO TABS: 650.0000 mg | ORAL_TABLET | ORAL | Status: DC | PRN

## 2020-12-10 MED ORDER — METOPROLOL TARTRATE 25 MG PO TABS
25.0000 mg | ORAL_TABLET | Freq: Two times a day (BID) | ORAL | Status: DC
  Administered 2020-12-10 – 2020-12-16 (×11): 25 mg via ORAL
  Filled 2020-12-10 (×11): qty 1

## 2020-12-10 MED ORDER — INSULIN ASPART 100 UNIT/ML ~~LOC~~ SOLN: 0.0000 [IU] | Freq: Three times a day (TID) | SUBCUTANEOUS | Status: DC

## 2020-12-10 MED ORDER — LABETALOL HCL 5 MG/ML IV SOLN: 10.0000 mg | INTRAVENOUS | Status: DC | PRN

## 2020-12-10 MED ORDER — IODIXANOL 320 MG/ML IV SOLN
INTRAVENOUS | Status: DC | PRN
  Administered 2020-12-10: 44 mL via INTRA_ARTERIAL

## 2020-12-10 MED ORDER — CLOPIDOGREL BISULFATE 75 MG PO TABS: 75.0000 mg | ORAL_TABLET | Freq: Every day | ORAL | Status: DC

## 2020-12-10 MED ORDER — LIDOCAINE HCL (PF) 1 % IJ SOLN
INTRAMUSCULAR | Status: AC
  Filled 2020-12-10: qty 30

## 2020-12-10 MED ORDER — HEPARIN (PORCINE) 25000 UT/250ML-% IV SOLN
1900.0000 [IU]/h | INTRAVENOUS | Status: DC
  Administered 2020-12-11: 1450 [IU]/h via INTRAVENOUS
  Administered 2020-12-11: 1350 [IU]/h via INTRAVENOUS
  Administered 2020-12-12: 1450 [IU]/h via INTRAVENOUS
  Administered 2020-12-13: 1900 [IU]/h via INTRAVENOUS
  Administered 2020-12-13: 1450 [IU]/h via INTRAVENOUS
  Filled 2020-12-10 (×8): qty 250

## 2020-12-10 MED ORDER — HEPARIN SODIUM (PORCINE) 1000 UNIT/ML IJ SOLN
INTRAMUSCULAR | Status: AC
  Filled 2020-12-10: qty 1

## 2020-12-10 MED ORDER — CLOPIDOGREL BISULFATE 75 MG PO TABS
75.0000 mg | ORAL_TABLET | Freq: Every day | ORAL | Status: DC
  Administered 2020-12-12 – 2020-12-13 (×2): 75 mg via ORAL
  Filled 2020-12-10 (×3): qty 1

## 2020-12-10 MED ORDER — CLOPIDOGREL BISULFATE 75 MG PO TABS
300.0000 mg | ORAL_TABLET | Freq: Once | ORAL | Status: AC
  Administered 2020-12-11: 300 mg via ORAL
  Filled 2020-12-10: qty 4

## 2020-12-10 SURGICAL SUPPLY — 17 items
BALLN MUSTANG 5X150X135 (BALLOONS) ×3
BALLOON MUSTANG 5X150X135 (BALLOONS) ×1 IMPLANT
CATH MUSTANG 4X200X135 (BALLOONS) ×1 IMPLANT
CATH OMNI FLUSH 5F 65CM (CATHETERS) ×2 IMPLANT
CATH QUICKCROSS SUPP .035X90CM (MICROCATHETER) ×1 IMPLANT
CLOSURE PERCLOSE PROSTYLE (VASCULAR PRODUCTS) ×2 IMPLANT
GLIDEWIRE ADV .035X260CM (WIRE) ×2 IMPLANT
KIT ENCORE 26 ADVANTAGE (KITS) ×2 IMPLANT
KIT MICROPUNCTURE NIT STIFF (SHEATH) ×2 IMPLANT
KIT PV (KITS) ×3 IMPLANT
SHEATH FLEX ANSEL ST 6FR 45CM (SHEATH) ×2 IMPLANT
SHEATH PINNACLE 5F 10CM (SHEATH) ×1 IMPLANT
STENT ELUVIA 6X100X130 (Permanent Stent) ×1 IMPLANT
STENT ELUVIA 6X120X130 (Permanent Stent) ×2 IMPLANT
STENT ELUVIA 6X80X130 (Permanent Stent) ×4 IMPLANT
TRANSDUCER W/STOPCOCK (MISCELLANEOUS) ×3 IMPLANT
TRAY PV CATH (CUSTOM PROCEDURE TRAY) ×3 IMPLANT

## 2020-12-10 NOTE — Progress Notes (Signed)
Pt has been NPO since midnight for possible cath lab procedure today. Signed placed on pt's door.

## 2020-12-10 NOTE — Evaluation (Signed)
Occupational Therapy Evaluation Patient Details Name: Shane [name] MRN: [id number] DOB: Aug 08, 1941 Today's Date: 12/10/2020    History of Present Illness Pt is a 80 y/o male admitted secondary to generalized weakness and chills. Found to also have a flutter. Pt with 5th metatarsal wound and is undergoing workup. Pt is s/p CABG 4 weeks ago at [facility]. PMH includes OSA on CPAP, a fib, HTN, CKD, CAD s/p pacemaker, and pulmonary fibrosis.   Clinical Impression   Pt was mod I with DME for ADL since CABG at [facility] 4 weeks ago. Pt with good comprehension of sternal precautions, and carryover during functional ADL and room mobility. Today Pt is min A to min guard for transfers with RW, min guard A for in room mobility, max A for rear peri care (he has a bidet and toilet aide at home to assist him with this) and max A for LB dressing (donning socks) min guard for sink level grooming. Pt on 4L O2 via [place] when OT arrive in room. On RA he maintained 90-95% with activity. He reports that he really just needs it in supine. OT will follow acutely for activity tolerance and for energy conservation education with ADL but do not anticipate need for post-acute OT, Pt and wife in agreement.     Follow Up Recommendations  Supervision - Intermittent;No OT follow up    Equipment Recommendations  None recommended by OT (Pt has appropriate DME)    Recommendations for Other Services       Precautions / Restrictions Precautions Precautions: Fall;Other (comment) Precaution Comments: Maintained sternal precautions Restrictions Weight Bearing Restrictions: No      Mobility Bed Mobility Overal bed mobility: Needs Assistance Bed Mobility: Supine to Sit;Sit to Supine     Supine to sit: Min assist;HOB elevated Sit to supine: Min assist (min A for BLE back into bed)   General bed mobility comments: use of bed rails to assist    Transfers Overall transfer level: Needs assistance Equipment used: Rolling walker (2  wheeled) Transfers: Sit to/from Stand Sit to Stand: Min guard         General transfer comment: Min guard for safety. Mild unsteadiness initially, however, able to regain balance independently.    Balance Overall balance assessment: Needs assistance Sitting-balance support: No upper extremity supported;Feet supported Sitting balance-Leahy Scale: Good     Standing balance support: Bilateral upper extremity supported Standing balance-Leahy Scale: Poor Standing balance comment: Reliant on BUE support                           ADL either performed or assessed with clinical judgement   ADL Overall ADL's : Needs assistance/impaired Eating/Feeding: NPO   Grooming: Wash/dry hands;Oral care;Min guard;Standing Grooming Details (indicate cue type and reason): sink level Upper Body Bathing: Moderate assistance;Sitting   Lower Body Bathing: Moderate assistance;Sitting/lateral leans   Upper Body Dressing : Minimal assistance;Sitting Upper Body Dressing Details (indicate cue type and reason): to don gown like robe Lower Body Dressing: Maximal assistance;Sitting/lateral leans Lower Body Dressing Details (indicate cue type and reason): unable to don his own socks, Toilet Transfer: Minimal assistance;Ambulation;RW Toilet Transfer Details (indicate cue type and reason): use of grab bars Toileting- Clothing Manipulation and Hygiene: Maximal assistance;Sit to/from stand Toileting - Clothing Manipulation Details (indicate cue type and reason): at home Pt has a bidet and toilet aide Tub/ Shower Transfer: Walk-in shower;Ambulation;Rolling walker;Grab bars;Minimal assistance;With caregiver independent assisting   Functional mobility during ADLs: Minimal  assistance;Rolling walker General ADL Comments: Pt still recovering from CABG at [facility], slow but steady progress, Pt has DME and AE to assist with ADL - wife willing and able to assist     Vision Patient Visual Report: No change from  baseline       Perception     Praxis      Pertinent Vitals/Pain Pain Assessment: Faces Faces Pain Scale: Hurts a little bit Pain Location: discomfort with coughing Pain Descriptors / Indicators: Discomfort Pain Intervention(s): Limited activity within patient's tolerance;Monitored during session;Repositioned     Hand Dominance Right   Extremity/Trunk Assessment Upper Extremity Assessment Upper Extremity Assessment: Generalized weakness;Overall [facility] for tasks assessed   Lower Extremity Assessment Lower Extremity Assessment: Defer to PT evaluation RLE Deficits / Details: Reports RLE weakness at baseline. Foot in inverted position and with foot drop.   Cervical / Trunk Assessment Cervical / Trunk Assessment: Normal   Communication Communication Communication: No difficulties   Cognition Arousal/Alertness: Awake/alert Behavior During Therapy: WFL for tasks assessed/performed Overall Cognitive Status: Within Functional Limits for tasks assessed                                     General Comments  on 4L O2 via [place] and on RA he was 95-90, he stated that is normal for him to require O2 in a supine position    Exercises     Shoulder Instructions      Home Living Family/patient expects to be discharged to:: Private residence Living Arrangements: Spouse/significant other Available Help at Discharge: Family;Available PRN/intermittently Type of Home: House Home Access: Stairs to enter [employer] of Steps: 5 Entrance Stairs-Rails: Right;Left;Can reach both Home Layout: Two level;Able to live on main level with bedroom/bathroom     Bathroom Shower/Tub: Occupational psychologist: Handicapped height Bathroom Accessibility: Yes How Accessible: Accessible via walker Home Equipment: Shower seat - built in;Cane - single point;Walker - 2 wheels;Grab bars - toilet;Grab bars - tub/shower;Youth worker - 4 wheels;Other (comment);Adaptive  equipment (bidet) Adaptive Equipment: Other (Comment) (toilet aide)        Prior Functioning/Environment Level of Independence: Independent with assistive device(s)        Comments: Has been using walker since surgery.        OT Problem List: Decreased activity tolerance;Impaired balance (sitting and/or standing);Decreased knowledge of use of DME or AE;Cardiopulmonary status limiting activity;Obesity      OT Treatment/Interventions: Self-care/ADL training;Energy conservation;DME and/or AE instruction;Therapeutic activities;Patient/family education;Balance training    OT Goals(Current goals can be found in the care plan section) Acute Rehab OT Goals Patient Stated Goal: To get better so [place] the beach again OT Goal Formulation: With patient/family Time For Goal Achievement: 12/24/20 Potential to Achieve Goals: Good ADL Goals Pt Will Perform Grooming: with modified independence;standing Pt Will Perform Upper Body Dressing: with modified independence;sitting Pt Will Perform Lower Body Dressing: with min guard assist;with caregiver independent in assisting;sit to/from stand Pt Will Transfer to Toilet: ambulating;with supervision Additional ADL Goal #1: Pt will verbalize 3 strategies for energy conservation during ADL with no cues Additional ADL Goal #2: Pt will perform bed mobility at mod I level prior to engaging in ADL  OT Frequency: Min 2X/week   Barriers to D/C:            Co-evaluation              AM-PAC  OT "6 Clicks" Daily Activity     Outcome Measure Help from another person eating meals?: Total (NPO) Help from another person taking care of personal grooming?: A Little Help from another person toileting, which includes using toliet, bedpan, or urinal?: A Lot Help from another person bathing (including washing, rinsing, drying)?: A Lot Help from another person to put on and taking off regular upper body clothing?: A Little Help from another person  to put on and taking off regular lower body clothing?: A Lot 6 Click Score: 13   End of Session Equipment Utilized During Treatment: Gait belt;Rolling walker;Oxygen (4L in supine) Nurse Communication: Mobility status  Activity Tolerance: Patient tolerated treatment well Patient left: in bed;with call bell/phone within reach;with family/visitor present  OT Visit Diagnosis: Unsteadiness on feet (R26.81);Muscle weakness (generalized) (M62.81)                Time: 2449-7530 OT Time Calculation (min): 36 min Charges:  OT General Charges $OT Visit: 1 Visit OT Evaluation $OT Eval Moderate Complexity: 1 Mod OT Treatments $Self Care/Home Management : 8-22 mins  Jesse Sans OTR/L Acute Rehabilitation Services Pager: [phone] Office: [place] 12/10/2020, 11:12 AM

## 2020-12-10 NOTE — Progress Notes (Signed)
ANTICOAGULATION CONSULT NOTE - Follow Up Consult  Pharmacy Consult for Apixaban to Heparin Indication: atrial fibrillation  Allergies  Allergen Reactions  . Codeine Hives and Itching  . Ofev [Nintedanib] Diarrhea    SEVERE DIARRHEA  . Other Diarrhea and Other (See Comments)    Esbriet causes elevated LFTs. D/C on 06/14/17 and SEVERE DIARRHEA    Patient Measurements: Height: 6' (182.9 cm) Weight: 111 kg (244 lb 11.2 oz) (scale C) IBW/kg (Calculated) : 77.6 Heparin Dosing Weight: 103 kg  Vital Signs: Temp: 99.3 F (37.4 C) (01/06 1247) Temp Source: Oral (01/06 1247) BP: 134/62 (01/06 1841) Pulse Rate: 0 (01/06 1841)  Labs: Recent Labs    12/08/20 1805 12/08/20 2045 12/09/20 0304 12/10/20 0354  HGB 11.9*  --  10.7* 9.6*  HCT 35.8*  --  32.4* 28.4*  PLT 257  --  218 198  CREATININE 1.58*  --  1.75* 1.78*  TROPONINIHS 27* 30* 33*  --     Estimated Creatinine Clearance: 43.3 mL/min (A) (by C-G formula based on SCr of 1.78 mg/dL (H)).  Assessment: 80 year old male to begin heparin after vascular procedure today  Last dose of apixaban 1/5 PM.  Per [facility] heparin to start 8h after sheath pull.  Goal of Therapy:  aPTT 66-102 seconds seconds Monitor platelets by anticoagulation protocol: Yes  Heparin level 0.3-0.7   Plan:  Heparin to begin at 1350 units/h no bolus at 0230 1/6 Check 8h heparin level and aPTT   Arrie Senate, PharmD, BCPS, [facility] Clinical Pharmacist [phone] Please check AMION for all [place] numbers 12/10/2020

## 2020-12-10 NOTE — Progress Notes (Signed)
   ASSESSMENT & PLAN:  Shane [name] is a 80 y.o. male with RLE lateral foot ulceration.  For RLE angiography today with CO2. Likely intervention. OK to resume anticoagulation after procedure with heparin. Discussed risk of access site complication and kidney injury. Patient is understanding and wish to proceed.  SUBJECTIVE:  No complaints.  OBJECTIVE:  BP (!) 120/52 (BP Location: Left Arm)   Pulse 61   Temp 98 F (36.7 C) (Oral)   Resp 18   Ht 6' (1.829 m)   Wt 111 kg Comment: scale C  SpO2 100%   BMI 33.19 kg/m   Intake/Output Summary (Last 24 hours) at 12/10/2020 1202 Last data filed at 12/10/2020 0630 Gross per 24 hour  Intake 480 ml  Output 775 ml  Net -295 ml    Constitutional: well appearing. no acute distress. Cardiac: RRR. Vascular: no palpable pedal pulses. Unchanged R foot ulcer.  CBC Latest Ref Rng & Units 12/10/2020 12/09/2020 12/08/2020  WBC 4.0 - 10.5 K/uL 11.9(H) 8.2 10.2  Hemoglobin 13.0 - 17.0 g/dL 9.6(L) 10.7(L) 11.9(L)  Hematocrit 39.0 - 52.0 % 28.4(L) 32.4(L) 35.8(L)  Platelets 150 - 400 K/uL 198 218 257     CMP Latest Ref Rng & Units 12/10/2020 12/09/2020 12/08/2020  Glucose 70 - 99 mg/dL 70 153(H) 143(H)  BUN 8 - 23 mg/dL 31(H) 37(H) 37(H)  Creatinine 0.61 - 1.24 mg/dL 1.78(H) 1.75(H) 1.58(H)  Sodium 135 - 145 mmol/L 131(L) 129(L) 131(L)  Potassium 3.5 - 5.1 mmol/L 4.2 4.6 4.5  Chloride 98 - 111 mmol/L 96(L) 93(L) 92(L)  CO2 22 - 32 mmol/L 25 24 26   Calcium 8.9 - 10.3 mg/dL 7.9(L) 8.4(L) 9.3  Total Protein 6.5 - 8.1 g/dL - 6.5 7.3  Total Bilirubin 0.3 - 1.2 mg/dL - 1.0 1.2  Alkaline Phos 38 - 126 U/L - 80 90  AST 15 - 41 U/L - 19 20  ALT 0 - 44 U/L - 17 19    Estimated Creatinine Clearance: 43.3 mL/min (A) (by C-G formula based on SCr of 1.78 mg/dL (H)).  Shane [name]. Stanford Breed, MD [facility] Phone Number: [phone] 12/10/2020 12:02 PM

## 2020-12-10 NOTE — Progress Notes (Signed)
    [facility] has been requested to perform a transesophageal echocardiogram on [facility] for bacteremia.  After careful review of history and examination, the risks and benefits of transesophageal echocardiogram have been explained including risks of esophageal damage, perforation (1:10,000 risk), bleeding, pharyngeal hematoma as well as other potential complications associated with conscious sedation including aspiration, arrhythmia, respiratory failure and death. Alternatives to treatment were discussed, questions were answered. Patient is willing to proceed.   Diann Bangerter Dyane Dustman, PA-C  12/10/2020 10:45 AM

## 2020-12-10 NOTE — Progress Notes (Signed)
ANTICOAGULATION CONSULT NOTE - Follow Up Consult  Pharmacy Consult for Apixaban to Heparin Indication: atrial fibrillation  Allergies  Allergen Reactions  . Codeine Hives and Itching  . Ofev [Nintedanib] Diarrhea    SEVERE DIARRHEA  . Other Diarrhea and Other (See Comments)    Esbriet causes elevated LFTs. D/C on 06/14/17 and SEVERE DIARRHEA    Patient Measurements: Height: 6' (182.9 cm) Weight: 111 kg (244 lb 11.2 oz) (scale C) IBW/kg (Calculated) : 77.6 Heparin Dosing Weight: 103 kg  Vital Signs: Temp: 99.3 F (37.4 C) (01/06 1247) Temp Source: Oral (01/06 1247) BP: 117/49 (01/06 1247) Pulse Rate: 64 (01/06 1247)  Labs: Recent Labs    12/08/20 1805 12/08/20 2045 12/09/20 0304 12/10/20 0354  HGB 11.9*  --  10.7* 9.6*  HCT 35.8*  --  32.4* 28.4*  PLT 257  --  218 198  CREATININE 1.58*  --  1.75* 1.78*  TROPONINIHS 27* 30* 33*  --     Estimated Creatinine Clearance: 43.3 mL/min (A) (by C-G formula based on SCr of 1.78 mg/dL (H)).  Assessment: 80 year old male to begin heparin after vascular procedure today  Last dose of apixaban 1/5 PM  Goal of Therapy:  aPTT 66-102 seconds seconds Monitor platelets by anticoagulation protocol: Yes  Heparin level 0.3-0.7   Plan:  Heparin to begin at 1350 units / hr after procedure today Daily heparin level, PTT, CBC  Thank you Anette Guarneri, PharmD 12/10/2020,3:17 PM

## 2020-12-10 NOTE — Progress Notes (Addendum)
Progress Note  Patient Name: Shane [name] Date of Encounter: 12/10/2020  [facility] Cardiologist: Kirk Ruths, MD   Subjective   Feels a bit better today though c/w chills intermittently,  No CP or SOB, did some ambulating this morning with PT  Inpatient Medications    Scheduled Meds:  apixaban  5 mg Oral BID   aspirin EC  81 mg Oral Daily   atorvastatin  40 mg Oral Daily   insulin aspart  0-15 Units Subcutaneous TID AC & HS   insulin aspart  10 Units Subcutaneous TID WC   insulin detemir  20 Units Subcutaneous QHS   metoprolol tartrate  50 mg Oral BID   pantoprazole  40 mg Oral Daily   Continuous Infusions:  sodium chloride      ceFAZolin (ANCEF) IV 2 g (12/10/20 0654)   PRN Meds: acetaminophen, albuterol, hydrALAZINE, ketotifen, LORazepam, ondansetron (ZOFRAN) IV, polyethylene glycol, prochlorperazine   Vital Signs    Vitals:   12/10/20 0024 12/10/20 0356 12/10/20 0555 12/10/20 0805  BP: (!) 118/51 (!) 117/52  (!) 120/52  Pulse: 64 63  61  Resp: 20 18  18   Temp: (!) 100.9 F (38.3 C) 99.6 F (37.6 C)  98 F (36.7 C)  TempSrc: Oral Oral  Oral  SpO2: 100% 100%  100%  Weight: 118 kg  111 kg   Height:        Intake/Output Summary (Last 24 hours) at 12/10/2020 0912 Last data filed at 12/10/2020 0630 Gross per 24 hour  Intake 480 ml  Output 775 ml  Net -295 ml   Last 3 Weights 12/10/2020 12/10/2020 12/09/2020  Weight (lbs) 244 lb 11.2 oz 260 lb 2.3 oz 255 lb 8.2 oz  Weight (kg) 110.995 kg 118 kg 115.9 kg      Telemetry    AFlutter 60's, often pacing - Personally Reviewed  ECG    No new EKGs - Personally Reviewed  Physical Exam   GEN: No acute distress.   Neck: No JVD Cardiac: irreg-irreg, no murmurs, rubs, or gallops.  Respiratory: CTA b/l. GI: Soft, nontender, non-distended  MS: trace edema; chronic Rfoot drop Neuro:  Nonfocal  Psych: Normal affect   Labs    High Sensitivity Troponin:   Recent Labs  Lab 12/08/20 1805 12/08/20 2045  12/09/20 0304  TROPONINIHS 27* 30* 33*      Chemistry Recent Labs  Lab 12/08/20 1805 12/09/20 0304 12/10/20 0354  NA 131* 129* 131*  K 4.5 4.6 4.2  CL 92* 93* 96*  CO2 26 24 25   GLUCOSE 143* 153* 70  BUN 37* 37* 31*  CREATININE 1.58* 1.75* 1.78*  CALCIUM 9.3 8.4* 7.9*  PROT 7.3 6.5  --   ALBUMIN 3.0* 2.5*  --   AST 20 19  --   ALT 19 17  --   ALKPHOS 90 80  --   BILITOT 1.2 1.0  --   GFRNONAA 44* 39* 38*  ANIONGAP 13 12 10      Hematology Recent Labs  Lab 12/08/20 1805 12/09/20 0304 12/10/20 0354  WBC 10.2 8.2 11.9*  RBC 3.77* 3.43* 3.04*  HGB 11.9* 10.7* 9.6*  HCT 35.8* 32.4* 28.4*  MCV 95.0 94.5 93.4  MCH 31.6 31.2 31.6  MCHC 33.2 33.0 33.8  RDW 13.4 13.4 13.2  PLT 257 218 198    BNP Recent Labs  Lab 12/08/20 1805  BNP 195.4*     DDimer No results for input(s): DDIMER in the last 168 hours.  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 Concern for pneumonia or abscess. EXAM: CT CHEST WITHOUT CONTRAST TECHNIQUE: Multidetector CT imaging of the chest was performed following the standard protocol without IV contrast. COMPARISON:  Chest CT dated 09/30/2019. FINDINGS: Evaluation of this exam is limited in the absence of intravenous contrast. Cardiovascular: There is mild cardiomegaly. No significant pericardial effusion. Three-vessel coronary vascular calcification and postsurgical changes of CABG. Left pectoral pacemaker device. There is mild atherosclerotic calcification of the thoracic aorta. There is dilatation of the main pulmonary trunk suggestive of a degree of pulmonary hypertension. Clinical correlation is recommended Mediastinum/Nodes: No hilar or mediastinal adenopathy. The esophagus is grossly unremarkable. No mediastinal fluid collection. Lungs/Pleura: Diffuse bilateral interstitial coarsening as seen previously in keeping with history of pulmonary fibrosis. No new  consolidation. There is no pleural effusion pneumothorax. The central airways are patent. Upper Abdomen: Slight irregularity of the liver contour may represent early changes of cirrhosis. Musculoskeletal: Median sternotomy wires. Degenerative changes of the spine. No acute osseous pathology. Partially visualized cervical fusion. IMPRESSION: 1. No acute intrathoracic pathology. 2. Pulmonary fibrosis. 3. Mild cardiomegaly with three-vessel coronary vascular calcification and postsurgical changes of CABG. 4. Aortic Atherosclerosis (ICD10-I70.0). Electronically Signed   By: Anner Crete M.D.   On: 12/08/2020 22:39    CT FOOT RIGHT WO CONTRAST Result Date: 12/09/2020 CLINICAL DATA:  Right foot pain and swelling in a diabetic patient. Skin ulceration at the distal fifth metatarsal. Question osteomyelitis. EXAM: CT OF THE RIGHT FOOT WITHOUT CONTRAST TECHNIQUE: Multidetector CT imaging of the right foot was performed according to the standard protocol. Multiplanar CT image reconstructions were also generated. COMPARISON:  None. FINDINGS: Bones/Joint/Cartilage No bony destructive change or periosteal reaction to suggest osteomyelitis is identified. No joint effusion is seen. Joint spaces are preserved. Small calcaneal spurs are noted. Ligaments Suboptimally assessed by CT. Muscles and Tendons No intramuscular fluid collection is identified. There is fatty atrophy of intrinsic musculature of the foot. No tendon tear. The distal Achilles tendon is mildly thickened compatible tendinopathy. Soft tissues There is subcutaneous edema over the dorsum of the foot. No radiopaque foreign body is identified. Skin ulceration adjacent to the fifth metatarsal head is noted. There is subcutaneous edema over the dorsum of the foot. IMPRESSION: Negative for CT evidence of osteomyelitis, abscess or septic joint. Skin ulceration adjacent to the fifth metatarsal. No abscess is identified. Subcutaneous edema about the dorsum of the foot is  compatible with cellulitis or dependent change. Mildly thickened Achilles tendinopathy compatible with insertional tendinosis without tear. Electronically Signed   By: Inge Rise M.D.   On: 12/09/2020 14:53      VAS [place] ABI WITH/WO TBI Result Date: 12/09/2020 LOWER EXTREMITY DOPPLER STUDY Indications: Ulceration, and Chronic ulcer to RLE forefoot. High Risk         Hypertension, hyperlipidemia, Diabetes, coronary artery Factors:          disease. Other Factors: AFIB, CKD3, PM.  Comparison Study: No previous exams. Performing Technologist: Rogelia Rohrer RVT, RDMS  Examination Guidelines: A complete evaluation includes at minimum, Doppler waveform signals and systolic blood pressure reading at the level of bilateral brachial, anterior tibial, and posterior tibial arteries, when vessel segments are accessible. Bilateral testing is considered an integral part of a complete examination. Photoelectric Plethysmograph (PPG) waveforms and toe systolic pressure readings are included as required and additional duplex testing as needed. Limited examinations for reoccurring indications may be performed  as noted.  ABI  Summary: Right: Resting right ankle-brachial index indicates moderate right lower extremity arterial disease. The right toe-brachial index is abnormal. ABI's likely falsely elevated due to vessel calcification. Left: Resting left ankle-brachial index indicates moderate left lower extremity arterial disease. The left toe-brachial index is abnormal.  *See table(s) above for measurements and observations.  Electronically signed by Jamelle Haring on 12/09/2020 at 12:28:38 PM.    Final      Cardiac Studies   11/04/20: inter-op TEE ([facility]) INTERPRETATION  Pre bypass TEE: mild RV dysfunction, normal LV function, estimated EF 50%, measured EF 48.8%, mild-mod AS, AVA 1.4 cm2, AV calcification partially fused left and right coronary cusps, mild MR, no PFO,   Post op TEE: LVEF 55% by 3D, RV dilation, mild-mod RV  dysfunction, moderate AS, moderate MR, mild TR, no PFO     Patient Profile     80 y.o. male with a hx of ideopathic Pulmonary fibrosis (Dr. Chase Caller, gets infusions of pamrevlumab every 3 weeks), sleep apnea,  CKD (III), DM, HTN, HLD, OSA w/CPAP, advanced heart block w/PPM, CAD (CABG (LIMA-LAD, SVG-Om1, SVG-PDA) on 11/05/20 at [facility] by Dr. Lincoln Brigham, discharged 11/16/20 ) VHD (mod AS), new AFlutter admitted with generalized malaise, febrile illness  Found with MSSA bacteremia, R foot wounds, and PVD   Device information  Biotronik PPM implanted 03/30/20 Interrogation from 12/08/20 was reviewed Initial AF detection was 11/26/20 In review with industry, has been 100% AF since 12/02/20  Assessment & Plan    1. New onset AFib/flutter     CHA2DS2Vasc is 5, started on Eliquis, appropriately dosed, started 12/08/20     Not unusual post CABG      rate controlled     Will update his EKG to get a better look at flutter, though by tele does not look typical, prior EKG of poor quality     His [place] will need interruption and no plans currently for DCCV      2. Generalized malaise, weakness 3. Reports of fever at home of about 100 or so     Tmax here in last 24hrs 100.9 4. R foot wound     No signs of osteo by CT 5. PVD     Ortho and vascular are on board     Planned for aortogram and possible intervention today 6. MSSA bacteremia (2/2 BC were  +)     ID on board  He continues with intermittent chills   7. CAD     CABG Dec 2021     No anginal symptoms     On ASA, statin, BB  8. PPM     Advanced heart block and symptomatic bradycardia indication      not dependent     By interrogation 12/08/20 he AP 10% and VP 20%     He will need TEE, scheduled for tomorrow     He will very likely need device removal     He is pacing a fair amount on telemetry, I have asked Biotronik to reduce his pacing rate to 45 and evaluate pacing needs     Will discuss timing with Dr. Lovena Le, early next week likely          D/w patient and his wife at bedside     D/w Dr. Lovena Le, will reduce his metoprolol in 1/2 to 25mg  BID    For questions or updates, please contact [place] Please consult [email or web address] for contact info under  Signed, Baldwin Jamaica, PA-C  12/10/2020, 9:12 AM    EP Attending  Patient seen and examined. Agree with above. He appears ill and will continue anti-biotics. TEE tomorrow. Continue anti-biotics. I suspect PM system extraction will be recommended.  Carleene Overlie Elisabeth Strom,MD

## 2020-12-10 NOTE — Op Note (Signed)
DATE OF SERVICE: 12/10/2020  PATIENT:  Shane [name]  80 y.o. male  PRE-OPERATIVE DIAGNOSIS:  pvd  POST-OPERATIVE DIAGNOSIS:  * No post-op diagnosis entered *  PROCEDURE:   1) US guided LCFA access 2) Aortogram 3) RLE angiogram (21mL iodinated contrast) 4) R superficial femoral / above-knee popliteal stenting (6x165mm, 6x1103mm, 6x15mm x 2 Eluvia - total treatment length 353mm)  SURGEON:  Surgeon(s) and Role:    * Cherre Robins, MD - Primary  ASSISTANT: none  ANESTHESIA:   local  EBL: min  BLOOD ADMINISTERED:none  DRAINS: none   LOCAL MEDICATIONS USED:  LIDOCAINE   SPECIMEN:  none  COUNTS: confirmed correct.  TOURNIQUET:  * No tourniquets in log *  PATIENT DISPOSITION:  PACU - hemodynamically stable.   Delay start of Pharmacological VTE agent (>24hrs) due to surgical blood loss or risk of bleeding: no  INDICATION FOR PROCEDURE: Shane [name] is a 80 y.o. 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 After careful discussion of risks, benefits, and alternatives the patient was offered angiography. We specifically discussed access site complications and contrast nephropathy. The patient understood and wished to proceed.  OPERATIVE FINDINGS:  Aorta: no flow limiting disease Right iliac arteries: no flow limiting disease Left iliac arteries: no flow limiting disease Right femoral arteries: SFA occluded just distal to origin. Reconstitution in distal SFA.  Right popliteal arteries: Above knee popliteal artery with focal >70% stenosis.  Right tibial trifurcation: Peroneal dominant flow to the foot. PT occludes in the calf.  Right tibial vessels: Peroneal fills the foot briskly. Good pedal flow.  DESCRIPTION OF PROCEDURE: After identification of the patient in the pre-operative holding area, the patient was transferred to the operating room. The patient was positioned  supine on the operating room table. Anesthesia was induced. The groins was prepped and draped in standard fashion. A surgical pause was performed confirming correct patient, procedure, and operative location.  The left groin was anesthetized with subcutaneous injection of 1% lidocaine. Using ultrasound guidance, the left common femoral artery was accessed with micropuncture technique. Fluoroscopy was used to confirm cannulation over the femoral head. Sheathogram was not performed. The 70F sheath was upsized to 33F.   An 035 glidewire advantage was advanced into the distal aorta. Over the wire an omni flush catheter was advanced to the level of L2. Aortogram was performed - see above for details.   The right common iliac artery was selected with the 035 glidewire advantage. The wire was advanced into the superficial femoral artery. Over the wire the omni flush catheter was advanced into the external iliac artery. Selective angiography was performed - see above for details.   The decision was made to intervene. The patient was heparinized with 10000 units of heparin. The sheath was exchanged for a 77F x 45cm sheath. Selective angiography of the left lower extremity was performed prior to intervention. The SFA and popliteal lesions were treated with stenting (6x165mm, 6x17mm, 6x83mm x 2 Eluvia). The stents were post dilated with a 5x219mm Mustang balloon.   Completion angiography revealed resolution of the stenosis / occlusion.  A perclose device was used to close the arteriotomy. Hemostasis was excellent upon completion.  Upon completion of the case instrument and sharps counts were confirmed correct. The patient was transferred to the PACU in good condition. I was present for all portions of the procedure.  [name]. Stanford Breed, MD [facility] Office Phone Number: [phone] 12/10/2020 6:36 PM

## 2020-12-10 NOTE — H&P (View-Only) (Signed)
    [facility] has been requested to perform a transesophageal echocardiogram on [facility] for bacteremia.  After careful review of history and examination, the risks and benefits of transesophageal echocardiogram have been explained including risks of esophageal damage, perforation (1:10,000 risk), bleeding, pharyngeal hematoma as well as other potential complications associated with conscious sedation including aspiration, arrhythmia, respiratory failure and death. Alternatives to treatment were discussed, questions were answered. Patient is willing to proceed.   Renee Dyane Dustman, PA-C  12/10/2020 10:45 AM

## 2020-12-10 NOTE — Progress Notes (Signed)
PROGRESS NOTE   Shane [name]  VZD:[id number]    DOB: Sep 28, 1941    DOA: 12/08/2020  PCP: Burnard Bunting, MD   I have briefly reviewed patients previous medical records in [facility].  Chief Complaint  Patient presents with  . Weakness  . Shortness of Breath  . Cough    Brief Narrative:  80 year old married male, IADLs, PMH of IPF (Dr. Chase Caller, pulmonology, gets infusions of Pamrevlumab 3 3 weeks), OSA on CPAP, CKD stage III AAA, DM, HTN, HLD, advanced heart block s/p PPM, CAD s/p CABG at [facility] 11/04/2020, moderate AS, discharged from [facility] 11/18/2020, slow recovery since, presented to ED due to 1 week or so history of increasing fatigue, chronic dyspnea, recent chills, low-grade fevers, frequent cough.  Chart review of OP notes indicate bouts of atrial tachycardia and A. fib since approximately 12/27 and device clinic from earlier on 1/4 indicates plans to initiate Eliquis.  Patient developed severe weakness, barely able to stand prompting ED visit.  Admitted for new onset A. fib/flutter, generalized weakness and malaise, fevers in the context of right foot wound.  Cardiology and orthopedics consulted.BCID showed MSSA, IV cefazolin initiated.  ID on board.  Vascular surgery plans for lower extremity ischemic work-up today and Cardiology consulted for TEE possibly 1/7.   Assessment & Plan:  Principal Problem:   MSSA bacteremia Active Problems:   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HCC)   OSA on CPAP   Essential hypertension   Chronic kidney disease, stage 3a (HCC)   Coronary artery disease involving native heart without angina pectoris   Cardiac device in situ   Generalized weakness   Dehydration with hyponatremia   Atrial fibrillation, chronic (HCC)   Elevated troponin level not due myocardial infarction   Diabetic foot ulcer ([place])   New onset atrial fib/flutter:  Cardiology consultation appreciated.  They do not think that his rate controlled flutter is playing a role  in his acute illness/symptoms.  As per the review of PPM: He has been in and out of A. fib since 11/26/2020 that has 100% burden since 12/02/2020.  No plans for DCCV at this time.  CHA2DS2Vasc is 5, started on Eliquis on 1/4.  Generalized weakness and fatigue  Multifactorial due to dehydration from poor oral intake, AKI, infectious etiology/fevers, recent major surgery and atrial fib/flutter.  Treat underlying cause and monitor.  PT and OT evaluation, still pending.  MSSA bacteremia, likely related to chronic right foot wound, ruling out endocarditis and pacemaker wire involvement  Work-up: No leukocytosis.  CRP up at 8.3.  Lactate normal.  UA not suggestive of UTI.  CT chest without contrast shows no acute pathology.  Flu panel PCR and COVID-19 PCR negative.  Blood cultures positive for MSSA  ID consultation and follow-up appreciated.  Discussed with ID team.  Continue cefazolin, called and arranged for TEE possibly 1/7, likely pacer extraction (due to high-grade bacteremia per ID) for next week, checking surveillance blood cultures today and holding off on PICC line for now.  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 Sharol Given, Orthopedics input appreciated.  CT of foot without abscess or osteomyelitis.  Suspects that patient may require fifth ray amputation.  PRAFO to protect his right heel and forefoot.  Vascular surgery input appreciated.  I discussed with Dr. Luan Pulling who plans ischemic work-up i.e. arteriogram today.  Patient and spouse aware that his renal insufficiency  may worsen with contrast.  Dr. Luan Pulling plans to use as little contrast as possible.  Continued IV hydration.  Type II DM with renal complications, peripheral neuropathy and hypoglycemia  CBG 61 this morning.  DM tightly controlled as noted below.  Also n.p.o. for  procedures.  Monitor CBGs closely, will reduce Levemir to 15 units at bedtime and change SSI to sensitive.  Monitor closely.  Dehydration with hyponatremia  Secondary to poor oral intake and diuretics.  Briefly hold diuretics and continue gentle IV fluids for additional 24 hours.  CAD s/p CABG 11/04/2020/elevated troponin  No anginal symptoms.  Sternotomy scar seems to be healing well.  Continue aspirin, atorvastatin and metoprolol tartrate.  Slightly elevated troponin with flat trend not consistent with ACS.  Permanent pacemaker  Cardiology following.  Advanced heart block and symptomatic bradycardia indication.  Not pacemaker dependent  By interrogation 12/08/2020: AP 10% and VP 20%.  May need device extraction.  Plan for TEE 1/7.  Cardiology adjusting pacemaker parameters.  Idiopathic pulmonary fibrosis  Outpatient follow-up with pulmonology.  OSA on CPAP  Continue CPAP.  Essential hypertension  Controlled.  Continue metoprolol.  Acute kidney injury complicating stage III aCKD  Baseline creatinine prior to recent CABG seem to be in the 1.5-1.6 range.  However on 12/13 and 12/14, creatinine was down to 1.2 but back up to 1.5 on 12/30.  Presented with creatinine of 1.58 which is increased to 1.75.  Creatinine has plateaued in the 1.7 range.  Hold Lasix.  Avoid nephrotoxins.  Continue gentle IV fluid hydration for additional 24 hours.  Continue to trend daily BMP  Wounds  Please see detailed note from [place] RN on 1/5  Anemia of chronic disease  Hemoglobin has dropped somewhat to 9.6 but likely dilutional and may be acute illness.  Follow CBC and transfuse if hemoglobin 7 g or less per  Body mass index is 33.19 kg/m.   DVT prophylaxis:      Code Status: Full Code Family Communication: I discussed in detail with patient's spouse yesterday and today at bedside, updated care and answered all questions. Disposition:  Status is: Inpatient  The  patient will require care spanning > 2 midnights and should be moved to inpatient because: Inpatient level of care appropriate due to severity of illness  Dispo: The patient is from: Home              Anticipated d/c is to: Home              Anticipated d/c date is: 3 days              Patient currently is not medically stable to d/c.        Consultants:   Cardiology Orthopedics/Dr. Sharol Given  Procedures:   None  Antimicrobials:    Anti-infectives (From admission, onward)   Start     Dose/Rate Route Frequency Ordered Stop   12/09/20 1430  ceFAZolin (ANCEF) IVPB 2g/100 mL premix        2 g 200 mL/hr over 30 Minutes Intravenous Every 8 hours 12/09/20 1418          Subjective:  Patient seen this morning prior to procedures.  Feeling much better.  Stronger and resting.  Chronic dyspnea, unchanged.  No pain issues.  Objective:   Vitals:   12/10/20 0356 12/10/20 0555 12/10/20 0805 12/10/20 1247  BP: (!) 117/52  (!) 120/52 (!) 117/49  Pulse: 63  61 64  Resp: 18  18 18   Temp:  99.6 F (37.6 C)  98 F (36.7 C) 99.3 F (37.4 C)  TempSrc: Oral  Oral Oral  SpO2: 100%  100% 100%  Weight:  111 kg    Height:        General exam: Pleasant elderly male, moderately built and obese lying supine in bed.  Oral mucosa with borderline hydration.  Appears better than he did yesterday in the ED. Respiratory system: Occasional basal crackles but otherwise clear to auscultation.  No increased work of breathing.  Pacemaker box left upper anterior chest. Cardiovascular system: S1 and S2 heard, irregularly irregular.  No JVD or murmurs. No pedal edema.  Telemetry personally reviewed: Shows atrial flutter with variable block and rate mostly in the 60s Gastrointestinal system: Abdomen is nondistended, soft and nontender. No organomegaly or masses felt. Normal bowel sounds heard. Central nervous system: Alert and oriented.  No focal neurological deficits. Extremities: Symmetric 5 x 5 power. Skin:  This was as examined on 1/4: Unable to palpate posterior tibial and dorsalis pedis and right lower extremity.  Foot drop noted.  Approximately 2 cm diameter right foot, lateral aspect of fifth metatarsal head wound with scabbing, minimal periwound erythema but on squeezing, purulent/bloody drainage.  No tenderness. Psychiatry: Judgement and insight appear normal. Mood & affect appropriate.     Data Reviewed:   I have personally reviewed following labs and imaging studies   CBC: Recent Labs  Lab 12/08/20 1805 12/09/20 0304 12/10/20 0354  WBC 10.2 8.2 11.9*  NEUTROABS 7.3 5.4  --   HGB 11.9* 10.7* 9.6*  HCT 35.8* 32.4* 28.4*  MCV 95.0 94.5 93.4  PLT 257 218 409    Basic Metabolic Panel: Recent Labs  Lab 12/08/20 1805 12/08/20 2045 12/09/20 0304 12/10/20 0354  NA 131*  --  129* 131*  K 4.5  --  4.6 4.2  CL 92*  --  93* 96*  CO2 26  --  24 25  GLUCOSE 143*  --  153* 70  BUN 37*  --  37* 31*  CREATININE 1.58*  --  1.75* 1.78*  CALCIUM 9.3  --  8.4* 7.9*  MG  --  1.7 1.9  --     Liver Function Tests: Recent Labs  Lab 12/08/20 1805 12/09/20 0304  AST 20 19  ALT 19 17  ALKPHOS 90 80  BILITOT 1.2 1.0  PROT 7.3 6.5  ALBUMIN 3.0* 2.5*    CBG: Recent Labs  Lab 12/09/20 2141 12/10/20 0607 12/10/20 0657  GLUCAP 83 61* 101*    Microbiology Studies:   Recent Results (from the past 240 hour(s))  Blood culture (routine x 2)     Status: Abnormal (Preliminary result)   Collection Time: 12/08/20  6:05 PM   Specimen: BLOOD  Result Value Ref Range Status   Specimen Description BLOOD RIGHT ANTECUBITAL  Final   Special Requests   Final    BOTTLES DRAWN AEROBIC AND ANAEROBIC Blood Culture adequate volume   Culture  Setup Time   Final    GRAM POSITIVE COCCI IN CLUSTERS IN BOTH AEROBIC AND ANAEROBIC BOTTLES Organism ID to follow CRITICAL RESULT CALLED TO, READ BACK BY AND VERIFIED WITH: A WOLFE PHARMD 1407 12/09/20 A BROWNING    Culture (A)  Final    STAPHYLOCOCCUS  AUREUS SUSCEPTIBILITIES TO FOLLOW Performed at [facility], [place] [address], [place], [place] [address]    Report Status PENDING  Incomplete  Blood culture (routine x 2)     Status: Abnormal (Preliminary result)  Collection Time: 12/08/20  6:05 PM   Specimen: BLOOD  Result Value Ref Range Status   Specimen Description BLOOD LEFT ANTECUBITAL  Final   Special Requests   Final    BOTTLES DRAWN AEROBIC AND ANAEROBIC Blood Culture adequate volume   Culture  Setup Time   Final    GRAM POSITIVE COCCI IN CLUSTERS IN BOTH AEROBIC AND ANAEROBIC BOTTLES CRITICAL VALUE NOTED.  VALUE IS CONSISTENT WITH PREVIOUSLY REPORTED AND CALLED VALUE. Performed at [facility], [place] [address] [place], [place] [address]    Culture STAPHYLOCOCCUS AUREUS (A)  Final   Report Status PENDING  Incomplete  Resp Panel by RT-PCR (Flu A&B, Covid) Nasopharyngeal Swab     Status: None   Collection Time: 12/08/20  6:05 PM   Specimen: Nasopharyngeal Swab; Nasopharyngeal(NP) swabs in vial transport medium  Result Value Ref Range Status   SARS Coronavirus 2 by RT PCR NEGATIVE NEGATIVE Final    Comment: (NOTE) SARS-CoV-2 target nucleic acids are NOT DETECTED.  The SARS-CoV-2 RNA is generally detectable in upper respiratory specimens during the acute phase of infection. The lowest concentration of SARS-CoV-2 viral copies this assay can detect is 138 copies/mL. A negative result does not preclude SARS-Cov-2 infection and should not be used as the sole basis for treatment or other patient management decisions. A negative result may occur with  improper specimen collection/handling, submission of specimen other than nasopharyngeal swab, presence of viral mutation(s) within the areas targeted by this assay, and inadequate number of viral copies(<138 copies/mL). A negative result must be combined with clinical observations, patient history, and epidemiological information. The expected result is  Negative.  Fact Sheet for Patients:  EntrepreneurPulse.com.au  Fact Sheet for Healthcare Providers:  IncredibleEmployment.be  This test is no t yet approved or cleared by the [place] FDA and  has been authorized for detection and/or diagnosis of SARS-CoV-2 by FDA under an Emergency Use Authorization (EUA). This EUA will remain  in effect (meaning this test can be used) for the duration of the COVID-19 declaration under Section 564(b)(1) of the Act, 21 U.S.C.section 360bbb-3(b)(1), unless the authorization is terminated  or revoked sooner.       Influenza A by PCR NEGATIVE NEGATIVE Final   Influenza B by PCR NEGATIVE NEGATIVE Final    Comment: (NOTE) The Xpert Xpress SARS-CoV-2/FLU/RSV plus assay is intended as an aid in the diagnosis of influenza from Nasopharyngeal swab specimens and should not be used as a sole basis for treatment. Nasal washings and aspirates are unacceptable for Xpert Xpress SARS-CoV-2/FLU/RSV testing.  Fact Sheet for Patients: EntrepreneurPulse.com.au  Fact Sheet for Healthcare Providers: IncredibleEmployment.be  This test is not yet approved or cleared by the [place] FDA and has been authorized for detection and/or diagnosis of SARS-CoV-2 by FDA under an Emergency Use Authorization (EUA). This EUA will remain in effect (meaning this test can be used) for the duration of the COVID-19 declaration under Section 564(b)(1) of the Act, 21 U.S.C. section 360bbb-3(b)(1), unless the authorization is terminated or revoked.  Performed at [facility], [place] [address], [place],  [address]   Blood Culture ID Panel (Reflexed)     Status: Abnormal   Collection Time: 12/08/20  6:05 PM  Result Value Ref Range Status   Enterococcus faecalis NOT DETECTED NOT DETECTED Final   Enterococcus Faecium NOT DETECTED NOT DETECTED Final   Listeria monocytogenes NOT DETECTED NOT  DETECTED Final   Staphylococcus species DETECTED (A) NOT DETECTED Final  Comment: CRITICAL RESULT CALLED TO, READ BACK BY AND VERIFIED WITH: A WOLFE PHARMD 1407 12/09/20 A BROWNING    Staphylococcus aureus (BCID) DETECTED (A) NOT DETECTED Final    Comment: CRITICAL RESULT CALLED TO, READ BACK BY AND VERIFIED WITH: A WOLFE PHARMD 1407 12/09/20 A BROWNING    Staphylococcus epidermidis NOT DETECTED NOT DETECTED Final   Staphylococcus lugdunensis NOT DETECTED NOT DETECTED Final   Streptococcus species NOT DETECTED NOT DETECTED Final   Streptococcus agalactiae NOT DETECTED NOT DETECTED Final   Streptococcus pneumoniae NOT DETECTED NOT DETECTED Final   Streptococcus pyogenes NOT DETECTED NOT DETECTED Final   A.calcoaceticus-baumannii NOT DETECTED NOT DETECTED Final   Bacteroides fragilis NOT DETECTED NOT DETECTED Final   Enterobacterales NOT DETECTED NOT DETECTED Final   Enterobacter cloacae complex NOT DETECTED NOT DETECTED Final   Escherichia coli NOT DETECTED NOT DETECTED Final   Klebsiella aerogenes NOT DETECTED NOT DETECTED Final   Klebsiella oxytoca NOT DETECTED NOT DETECTED Final   Klebsiella pneumoniae NOT DETECTED NOT DETECTED Final   Proteus species NOT DETECTED NOT DETECTED Final   Salmonella species NOT DETECTED NOT DETECTED Final   Serratia marcescens NOT DETECTED NOT DETECTED Final   Haemophilus influenzae NOT DETECTED NOT DETECTED Final   Neisseria meningitidis NOT DETECTED NOT DETECTED Final   Pseudomonas aeruginosa NOT DETECTED NOT DETECTED Final   Stenotrophomonas maltophilia NOT DETECTED NOT DETECTED Final   Candida albicans NOT DETECTED NOT DETECTED Final   Candida auris NOT DETECTED NOT DETECTED Final   Candida glabrata NOT DETECTED NOT DETECTED Final   Candida krusei NOT DETECTED NOT DETECTED Final   Candida parapsilosis NOT DETECTED NOT DETECTED Final   Candida tropicalis NOT DETECTED NOT DETECTED Final   Cryptococcus neoformans/gattii NOT DETECTED NOT DETECTED  Final   Meth resistant mecA/C and MREJ NOT DETECTED NOT DETECTED Final    Comment: Performed at [facility], [address], [place], [place] [address]     Radiology Studies:  CT CHEST WO CONTRAST  Result Date: 12/08/2020 CLINICAL DATA:  80 year old male with idiopathic pulmonary fibrosis. Concern for pneumonia or abscess. EXAM: CT CHEST WITHOUT CONTRAST TECHNIQUE: Multidetector CT imaging of the chest was performed following the standard protocol without IV contrast. COMPARISON:  Chest CT dated 09/30/2019. FINDINGS: Evaluation of this exam is limited in the absence of intravenous contrast. Cardiovascular: There is mild cardiomegaly. No significant pericardial effusion. Three-vessel coronary vascular calcification and postsurgical changes of CABG. Left pectoral pacemaker device. There is mild atherosclerotic calcification of the thoracic aorta. There is dilatation of the main pulmonary trunk suggestive of a degree of pulmonary hypertension. Clinical correlation is recommended Mediastinum/Nodes: No hilar or mediastinal adenopathy. The esophagus is grossly unremarkable. No mediastinal fluid collection. Lungs/Pleura: Diffuse bilateral interstitial coarsening as seen previously in keeping with history of pulmonary fibrosis. No new consolidation. There is no pleural effusion pneumothorax. The central airways are patent. Upper Abdomen: Slight irregularity of the liver contour may represent early changes of cirrhosis. Musculoskeletal: Median sternotomy wires. Degenerative changes of the spine. No acute osseous pathology. Partially visualized cervical fusion. IMPRESSION: 1. No acute intrathoracic pathology. 2. Pulmonary fibrosis. 3. Mild cardiomegaly with three-vessel coronary vascular calcification and postsurgical changes of CABG. 4. Aortic Atherosclerosis (ICD10-I70.0). Electronically Signed   By: Anner Crete M.D.   On: 12/08/2020 22:39   CT FOOT RIGHT WO CONTRAST  Result Date: 12/09/2020 CLINICAL  DATA:  Right foot pain and swelling in a diabetic patient. Skin ulceration at the distal fifth metatarsal. Question osteomyelitis. EXAM:  CT OF THE RIGHT FOOT WITHOUT CONTRAST TECHNIQUE: Multidetector CT imaging of the right foot was performed according to the standard protocol. Multiplanar CT image reconstructions were also generated. COMPARISON:  None. FINDINGS: Bones/Joint/Cartilage No bony destructive change or periosteal reaction to suggest osteomyelitis is identified. No joint effusion is seen. Joint spaces are preserved. Small calcaneal spurs are noted. Ligaments Suboptimally assessed by CT. Muscles and Tendons No intramuscular fluid collection is identified. There is fatty atrophy of intrinsic musculature of the foot. No tendon tear. The distal Achilles tendon is mildly thickened compatible tendinopathy. Soft tissues There is subcutaneous edema over the dorsum of the foot. No radiopaque foreign body is identified. Skin ulceration adjacent to the fifth metatarsal head is noted. There is subcutaneous edema over the dorsum of the foot. IMPRESSION: Negative for CT evidence of osteomyelitis, abscess or septic joint. Skin ulceration adjacent to the fifth metatarsal. No abscess is identified. Subcutaneous edema about the dorsum of the foot is compatible with cellulitis or dependent change. Mildly thickened Achilles tendinopathy compatible with insertional tendinosis without tear. Electronically Signed   By: Inge Rise M.D.   On: 12/09/2020 14:53   DG Chest Port 1 View  Result Date: 12/08/2020 CLINICAL DATA:  Shortness of breath EXAM: PORTABLE CHEST 1 VIEW COMPARISON:  07/17/2020, CT 09/30/2019, 03/31/2020, 04/27/2018 FINDINGS: Surgical hardware in the cervical spine. Left-sided pacing device as before. Interval post sternotomy changes. Diffuse bilateral left greater than right reticular and ground-glass opacity, likely corresponding to history of pulmonary fibrosis. Chronic pleural scarring on the left. No  acute airspace consolidation or pneumothorax. Stable cardiomediastinal silhouette. IMPRESSION: 1. Stable radiographic appearance of the chest with pulmonary fibrosis. No acute airspace disease. Electronically Signed   By: Donavan Foil M.D.   On: 12/08/2020 19:05   VAS [place] ABI WITH/WO TBI  Result Date: 12/09/2020 LOWER EXTREMITY DOPPLER STUDY Indications: Ulceration, and Chronic ulcer to RLE forefoot. High Risk         Hypertension, hyperlipidemia, Diabetes, coronary artery Factors:          disease. Other Factors: AFIB, CKD3, PM.  Comparison Study: No previous exams. Performing Technologist: Rogelia Rohrer RVT, RDMS  Examination Guidelines: A complete evaluation includes at minimum, Doppler waveform signals and systolic blood pressure reading at the level of bilateral brachial, anterior tibial, and posterior tibial arteries, when vessel segments are accessible. Bilateral testing is considered an integral part of a complete examination. Photoelectric Plethysmograph (PPG) waveforms and toe systolic pressure readings are included as required and additional duplex testing as needed. Limited examinations for reoccurring indications may be performed as noted.  ABI Findings: +---------+------------------+-----+-------------------+----------------+ Right    Rt Pressure (mmHg)IndexWaveform           Comment          +---------+------------------+-----+-------------------+----------------+ Brachial 122                    biphasic                            +---------+------------------+-----+-------------------+----------------+ PTA                             dampened monophasicnon compressible +---------+------------------+-----+-------------------+----------------+ DP       80                0.62 dampened monophasic                 +---------+------------------+-----+-------------------+----------------+  Great Toe0                 0.00 Absent                               +---------+------------------+-----+-------------------+----------------+ +---------+------------------+-----+----------+----------------+ Left     Lt Pressure (mmHg)IndexWaveform  Comment          +---------+------------------+-----+----------+----------------+ Brachial 130                    triphasic                  +---------+------------------+-----+----------+----------------+ PTA                             biphasic  non compressible +---------+------------------+-----+----------+----------------+ DP       81                0.62 monophasic                 +---------+------------------+-----+----------+----------------+ Great Toe47                0.36 Abnormal                   +---------+------------------+-----+----------+----------------+ +-------+-----------+-----------+------------+------------+ ABI/TBIToday's ABIToday's TBIPrevious ABIPrevious TBI +-------+-----------+-----------+------------+------------+ Right  0.62       0                                   +-------+-----------+-----------+------------+------------+ Left   0.62       0.36                                +-------+-----------+-----------+------------+------------+  Summary: Right: Resting right ankle-brachial index indicates moderate right lower extremity arterial disease. The right toe-brachial index is abnormal. ABI's likely falsely elevated due to vessel calcification. Left: Resting left ankle-brachial index indicates moderate left lower extremity arterial disease. The left toe-brachial index is abnormal.  *See table(s) above for measurements and observations.  Electronically signed by Jamelle Haring on 12/09/2020 at 12:28:38 PM.    Final      Scheduled Meds:   . apixaban  5 mg Oral BID  . aspirin EC  81 mg Oral Daily  . atorvastatin  40 mg Oral Daily  . insulin aspart  0-15 Units Subcutaneous TID AC & HS  . insulin aspart  10 Units Subcutaneous TID WC  . insulin detemir  20  Units Subcutaneous QHS  . metoprolol tartrate  50 mg Oral BID  . pantoprazole  40 mg Oral Daily    Continuous Infusions:   . sodium chloride 100 mL/hr at 12/10/20 1029  .  ceFAZolin (ANCEF) IV 2 g (12/10/20 0654)     LOS: 1 day     Vernell Leep, MD, [place], [facility]. [facility]    To contact the attending provider between 7A-7P or the covering provider during after hours 7P-7A, please log into the web site [email or web address] and access using universal [place] password for that web site. If you do not have the password, please call the hospital operator.  12/10/2020, 12:54 PM

## 2020-12-10 NOTE — Progress Notes (Signed)
[facility]  Date of Admission:  12/08/2020      Total days of antibiotics 2 Cefazolin         ASSESSMENT: Shane [name] is a 80 y.o. male with MSSA bacteremia. Had a fever overnight but none today and feeling a bit better today. 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 Will need TEE (set up for tomorrow) to assess native valves to help determine treatment recommendation prior to consideration of re-implantation. Appreciate EP assistance.   Will repeat blood cultures in AM to see if he is clearing. Not a rifampin candidate - will consider adding rifabutin once daily for biofilm if he is not a device extraction candidate.   Hold on PICC line for now.   Also working on blood flow study of R foot with angiogram in the setting of calcified vessels. CT scan without any concern for fluid collection, osteomyelitis or septic joint. Dr. Sharol Given following.     PLAN: 1. Continue cefazolin 2. Follow TEE results 1/7 3. Follow EP recs re: pacer extraction candidacy  4. Repeat blood cultures in AM  5. Follow blood flow studies for R foot intervention   Principal Problem:   MSSA bacteremia Active Problems:   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HCC)   OSA on CPAP   Essential hypertension   Chronic kidney disease, stage 3a (HCC)   Coronary artery disease involving native heart without angina pectoris   Cardiac device in situ   Generalized weakness   Dehydration with hyponatremia   Atrial fibrillation, chronic (HCC)   Elevated troponin level not due myocardial infarction   Diabetic foot ulcer ([place])   . apixaban  5 mg Oral BID  . aspirin EC  81 mg Oral Daily  . atorvastatin  40 mg Oral Daily  . insulin aspart  0-15 Units Subcutaneous TID AC & HS  . insulin aspart  10 Units Subcutaneous TID WC  . insulin detemir  20 Units Subcutaneous  QHS  . metoprolol tartrate  50 mg Oral BID  . pantoprazole  40 mg Oral Daily    SUBJECTIVE: Sitting on side of bed working with PT. No new complaints this AM. One episode of chills last night but better today.    Review of Systems: Review of Systems  Constitutional: Positive for chills and fever.  Respiratory: Positive for shortness of breath. Negative for cough and sputum production.   Cardiovascular: Negative for claudication and leg swelling.  Gastrointestinal: Negative for abdominal pain and vomiting.  Genitourinary: Negative for dysuria.  Musculoskeletal: Negative for back pain and joint pain.  Skin: Negative for rash.  Neurological: Negative for dizziness.    Allergies  Allergen Reactions  . Codeine Hives and Itching  . Ofev [Nintedanib] Diarrhea    SEVERE DIARRHEA  . Other Diarrhea and Other (See Comments)    Esbriet causes elevated LFTs. D/C on 06/14/17 and SEVERE DIARRHEA    OBJECTIVE: Vitals:   12/10/20 0024 12/10/20 0356 12/10/20 0555 12/10/20 0805  BP: (!) 118/51 (!) 117/52  (!) 120/52  Pulse: 64 63  61  Resp: 20 18  18   Temp: (!) 100.9 F (38.3 C) 99.6 F (37.6 C)  98 F (36.7 C)  TempSrc: Oral Oral  Oral  SpO2: 100% 100%  100%  Weight: 118 kg  111 kg   Height:  Body mass index is 33.19 kg/m.  Physical Exam Vitals reviewed.  Constitutional:      Appearance: He is well-developed and well-nourished.     Comments: Seated on side of the bed. Comfortable and no distress.   HENT:     Mouth/Throat:     Mouth: Oropharynx is clear and moist and mucous membranes are normal.     Dentition: Normal dentition. No dental abscesses.  Cardiovascular:     Rate and Rhythm: Normal rate and regular rhythm.     Heart sounds: Normal heart sounds.  Pulmonary:     Effort: Pulmonary effort is normal.     Breath sounds: Normal breath sounds. No decreased breath sounds.  Abdominal:     General: There is no distension.     Palpations: Abdomen is soft.      Tenderness: There is no abdominal tenderness.  Lymphadenopathy:     Cervical: No cervical adenopathy.  Skin:    General: Skin is warm and dry.     Findings: No rash.  Neurological:     Mental Status: He is alert and oriented to person, place, and time.  Psychiatric:        Mood and Affect: Mood and affect normal.        Judgment: Judgment normal.     Comments:       Lab Results Lab Results  Component Value Date   WBC 11.9 (H) 12/10/2020   HGB 9.6 (L) 12/10/2020   HCT 28.4 (L) 12/10/2020   MCV 93.4 12/10/2020   PLT 198 12/10/2020    Lab Results  Component Value Date   CREATININE 1.78 (H) 12/10/2020   BUN 31 (H) 12/10/2020   NA 131 (L) 12/10/2020   K 4.2 12/10/2020   CL 96 (L) 12/10/2020   CO2 25 12/10/2020    Lab Results  Component Value Date   ALT 17 12/09/2020   AST 19 12/09/2020   ALKPHOS 80 12/09/2020   BILITOT 1.0 12/09/2020     Microbiology: BCx 1/4 >> 4/4 bottles MSSA    [name], MSN, NP-C [facility] [email or web address] Pager: [phone] Office: [phone] [place]: [phone]

## 2020-12-10 NOTE — Progress Notes (Signed)
   12/10/20 1700  Clinical Encounter Type  Visited With Family  Visit Type Spiritual support;Social support  Referral From Nurse  Consult/Referral To [facility]  The chaplain visited the patient's wife [name]) after the patient went to a procedure. The wife is a bit overwhelmed with all of the health conditions her husband is facing and is worried about him. The wife requested prayer and this chaplain prayed for strength, peace and clarity. The chaplain offered emotional and social support.

## 2020-12-11 ENCOUNTER — Encounter (HOSPITAL_COMMUNITY): Payer: Self-pay | Admitting: Vascular Surgery

## 2020-12-11 ENCOUNTER — Telehealth (INDEPENDENT_AMBULATORY_CARE_PROVIDER_SITE_OTHER): Payer: Medicare Other | Admitting: Internal Medicine

## 2020-12-11 ENCOUNTER — Inpatient Hospital Stay (HOSPITAL_COMMUNITY): Payer: Medicare Other | Admitting: Anesthesiology

## 2020-12-11 ENCOUNTER — Inpatient Hospital Stay (HOSPITAL_COMMUNITY): Payer: Medicare Other

## 2020-12-11 ENCOUNTER — Ambulatory Visit (HOSPITAL_COMMUNITY): Payer: Medicare Other

## 2020-12-11 ENCOUNTER — Encounter (HOSPITAL_COMMUNITY)
Admission: EM | Disposition: A | Discharge status: Home Health | Payer: Self-pay | Source: Home / Self Care | Attending: Internal Medicine

## 2020-12-11 ENCOUNTER — Telehealth: Payer: Self-pay | Admitting: Internal Medicine

## 2020-12-11 DIAGNOSIS — I350 Nonrheumatic aortic (valve) stenosis: Secondary | ICD-10-CM

## 2020-12-11 DIAGNOSIS — E13621 Other specified diabetes mellitus with foot ulcer: Secondary | ICD-10-CM | POA: Diagnosis not present

## 2020-12-11 DIAGNOSIS — R7881 Bacteremia: Secondary | ICD-10-CM

## 2020-12-11 DIAGNOSIS — B9561 Methicillin susceptible Staphylococcus aureus infection as the cause of diseases classified elsewhere: Secondary | ICD-10-CM | POA: Diagnosis not present

## 2020-12-11 DIAGNOSIS — N1831 Chronic kidney disease, stage 3a: Secondary | ICD-10-CM | POA: Diagnosis not present

## 2020-12-11 DIAGNOSIS — Z959 Presence of cardiac and vascular implant and graft, unspecified: Secondary | ICD-10-CM | POA: Diagnosis not present

## 2020-12-11 HISTORY — PX: TEE WITHOUT CARDIOVERSION: SHX5443

## 2020-12-11 LAB — BASIC METABOLIC PANEL
Anion gap: 9 (ref 5–15)
BUN: 28 mg/dL — ABNORMAL HIGH (ref 8–23)
CO2: 21 mmol/L — ABNORMAL LOW (ref 22–32)
Calcium: 7.4 mg/dL — ABNORMAL LOW (ref 8.9–10.3)
Chloride: 99 mmol/L (ref 98–111)
Creatinine, Ser: 1.55 mg/dL — ABNORMAL HIGH (ref 0.61–1.24)
GFR, Estimated: 45 mL/min — ABNORMAL LOW (ref >60)
Glucose, Bld: 114 mg/dL — ABNORMAL HIGH (ref 70–99)
Potassium: 3.9 mmol/L (ref 3.5–5.1)
Sodium: 129 mmol/L — ABNORMAL LOW (ref 135–145)

## 2020-12-11 LAB — APTT
aPTT: 57 seconds — ABNORMAL HIGH (ref 24–36)
aPTT: 60 seconds — ABNORMAL HIGH (ref 24–36)

## 2020-12-11 LAB — ECHO TEE
AR max vel: 1.89 cm2
AV Area VTI: 1.65 cm2
AV Area mean vel: 1.7 cm2
AV Mean grad: 12.5 mmHg
AV Peak grad: 19.9 mmHg
Ao pk vel: 2.23 m/s

## 2020-12-11 LAB — CBC
HCT: 28.1 % — ABNORMAL LOW (ref 39.0–52.0)
Hemoglobin: 9.1 g/dL — ABNORMAL LOW (ref 13.0–17.0)
MCH: 30.6 pg (ref 26.0–34.0)
MCHC: 32.4 g/dL (ref 30.0–36.0)
MCV: 94.6 fL (ref 80.0–100.0)
Platelets: 174 10*3/uL (ref 150–400)
RBC: 2.97 MIL/uL — ABNORMAL LOW (ref 4.22–5.81)
RDW: 13.1 % (ref 11.5–15.5)
WBC: 10.4 10*3/uL (ref 4.0–10.5)
nRBC: 0 % (ref 0.0–0.2)

## 2020-12-11 LAB — GLUCOSE, CAPILLARY
Glucose-Capillary: 91 mg/dL (ref 70–99)
Glucose-Capillary: 89 mg/dL (ref 70–99)
Glucose-Capillary: 105 mg/dL — ABNORMAL HIGH (ref 70–99)
Glucose-Capillary: 124 mg/dL — ABNORMAL HIGH (ref 70–99)

## 2020-12-11 LAB — HEPARIN LEVEL (UNFRACTIONATED): Heparin Unfractionated: 1.64 IU/mL — ABNORMAL HIGH (ref 0.30–0.70)

## 2020-12-11 SURGERY — ECHOCARDIOGRAM, TRANSESOPHAGEAL
Anesthesia: Monitor Anesthesia Care

## 2020-12-11 MED ORDER — LACTATED RINGERS IV SOLN: INTRAVENOUS | Status: DC | PRN

## 2020-12-11 MED ORDER — PROPOFOL 500 MG/50ML IV EMUL
INTRAVENOUS | Status: DC | PRN
  Administered 2020-12-11: 125 ug/kg/min via INTRAVENOUS

## 2020-12-11 MED ORDER — BUTAMBEN-TETRACAINE-BENZOCAINE 2-2-14 % EX AERO
INHALATION_SPRAY | CUTANEOUS | Status: DC | PRN
  Administered 2020-12-11: 2 via TOPICAL

## 2020-12-11 MED ORDER — ENSURE MAX PROTEIN PO LIQD
11.0000 [oz_av] | Freq: Two times a day (BID) | ORAL | Status: DC
  Administered 2020-12-12 – 2020-12-16 (×5): 11 [oz_av] via ORAL
  Filled 2020-12-11 (×11): qty 330

## 2020-12-11 MED ORDER — LIDOCAINE HCL (CARDIAC) PF 100 MG/5ML IV SOSY
PREFILLED_SYRINGE | INTRAVENOUS | Status: DC | PRN
  Administered 2020-12-11: 60 mg via INTRATRACHEAL

## 2020-12-11 MED ORDER — ADULT MULTIVITAMIN W/MINERALS CH
1.0000 | ORAL_TABLET | Freq: Every day | ORAL | Status: DC
  Administered 2020-12-11 – 2020-12-16 (×6): 1 via ORAL
  Filled 2020-12-11 (×6): qty 1

## 2020-12-11 MED ORDER — LACTATED RINGERS IV SOLN
INTRAVENOUS | Status: AC | PRN
  Administered 2020-12-11: 1000 mL via INTRAVENOUS

## 2020-12-11 NOTE — Anesthesia Postprocedure Evaluation (Signed)
Anesthesia Post Note  Patient: Shane [name]  Procedure(s) Performed: TRANSESOPHAGEAL ECHOCARDIOGRAM (TEE) (N/A )     Patient location during evaluation: PACU Anesthesia Type: MAC Level of consciousness: awake and alert Pain management: pain level controlled Vital Signs Assessment: post-procedure vital signs reviewed and stable Respiratory status: spontaneous breathing, nonlabored ventilation and respiratory function stable Cardiovascular status: blood pressure returned to baseline and stable Postop Assessment: no apparent nausea or vomiting Anesthetic complications: no   No complications documented.  Last Vitals:  Vitals:   12/11/20 1015 12/11/20 1030  BP: (!) 134/59 (!) 131/39  Pulse: 64 63  Resp: 17 (!) 22  Temp:    SpO2: 96% 93%    Last Pain:  Vitals:   12/11/20 1030  TempSrc:   PainSc: 0-No pain                 Pervis Hocking

## 2020-12-11 NOTE — Progress Notes (Signed)
Pt placed on cpap 

## 2020-12-11 NOTE — Progress Notes (Signed)
Initial Nutrition Assessment  DOCUMENTATION CODES:   Obesity unspecified  INTERVENTION:   Ensure Max BID, each supplement provides 150 kcals and 30 grams of protein   MVI with minerals daily   NUTRITION DIAGNOSIS:   Increased nutrient needs related to acute illness,wound healing as evidenced by estimated needs.  GOAL:   Patient will meet greater than or equal to 90% of their needs  MONITOR:   PO intake,Supplement acceptance,Labs,Weight trends,Skin  REASON FOR ASSESSMENT:   Malnutrition Screening Tool    ASSESSMENT:   80 y.o. male with PMH of CAD s/p CABG x3 at [facility] on 12/1, CHB s/p PPM 03/2020, CKD IIIa, DM, sleep apnea, HTN, aortic stenosis, and idiopathic pulmonary fibrosis. Upon admission, found to be in controlled Afib and have atherosclerosis of native arteries of right lower extremity causing ulceratio s/p stent placement. Underwent TEE for MSSA bacteremia.   Spoke with pt at bedside. Wife present during visit. Pt reports his appetite is decreased but is slowly improving during this admission. Wife reports that pt has COVID previously and since this, he has not had a taste for meat. She reports he does not like the taste anymore. He reports prior to his admission to [facility] in November, he appetite was normal. Since discharge from [facility] his appetite is decreased. Lunch tray of a cheese sandwich and carrots noted at bedside with 90% completion. Pt states this is the largest meal he has been able to consume since admissions. Boost drink was also at bedside that wife had provided. Pt stated he likes to drink these to increase his protein intake. Pt agreeable to drinking supplements while admitted. Brief education was provided on the importance of protein intake in wound healing.   Pt reports he has experienced weight loss since his admission at [facility]. He reports before falling ill on 11/30, his weight was 267 lbs. He reports his UBW is around 260-265 lbs. Per chart review, pt weight  appears stable over the past 6 months although many weights appear to be stated. Per chart review on [facility] Everywhere, on 12/1 he was 263 lbs. His current dry weight this admission is 247 lbs. This indicates a weight loss of 6% in 1 month, which is significant for time frame.   Pt reports he is not very mobile and that he is unable to walk without assistance of a walker.   Labs reviewed: Sodium 129, CBG's x 24 hours: 73-129  Medications reviewed and include: Plavix, Novolog SSI, Levemir, Lopressor, Protonix, Ancef 200 mL/hr, Heparin 14 mL/hr   NUTRITION - FOCUSED PHYSICAL EXAM: Suspect mild depletions in lower extremities likely d/t immobility.   Flowsheet Row Most Recent Value  Orbital Region No depletion  Upper Arm Region No depletion  Thoracic and Lumbar Region No depletion  Buccal Region No depletion  Temple Region Mild depletion  Clavicle Bone Region No depletion  Clavicle and Acromion Bone Region No depletion  Scapular Bone Region No depletion  Dorsal Hand No depletion  Patellar Region Mild depletion  Anterior Thigh Region Mild depletion  Posterior Calf Region Mild depletion  Edema (RD Assessment) Mild  Hair Reviewed  Eyes Reviewed  Mouth Reviewed  Skin Reviewed  Nails Reviewed       Diet Order:   Diet Order            Diet heart healthy/carb modified Room service appropriate? Yes; Fluid consistency: Thin  Diet effective now  EDUCATION NEEDS:   Education needs have been addressed  Skin:  Skin Assessment: Skin Integrity Issues: Skin Integrity Issues:: Stage II,Incisions,Diabetic Ulcer Stage II: Coccyx Diabetic Ulcer: R foot Incisions: Sternum  Last BM:  12/10/2020  Height:   Ht Readings from Last 1 Encounters:  12/09/20 6' (1.829 m)    Weight:   Wt Readings from Last 1 Encounters:  12/11/20 119.2 kg     BMI:  Body mass index is 35.64 kg/m.  Estimated Nutritional Needs:   Kcal:  2250-2450 kcals  Protein:  115-135  grams  Fluid:  > 2.2 L/day    Ronnald Nian, Dietetic Intern Pager: [phone] If unavailable: [phone]

## 2020-12-11 NOTE — Progress Notes (Signed)
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 aspirin EC  81 mg Oral Daily  . atorvastatin  40 mg Oral Daily  . [START ON 12/12/2020] clopidogrel  75 mg Oral Q breakfast  . insulin aspart  0-9 Units Subcutaneous TID WC  . insulin aspart  7 Units Subcutaneous TID WC  . insulin detemir  15 Units Subcutaneous QHS  . metoprolol tartrate  25 mg Oral BID  . pantoprazole  40 mg Oral Daily  . sodium chloride flush  3 mL Intravenous Q12H    Continuous Infusions: . sodium chloride    .  ceFAZolin (ANCEF) IV 2 g (12/11/20 0609)  . heparin 1,350 Units/hr (12/11/20 0306)    PRN Meds: sodium chloride, acetaminophen, albuterol, hydrALAZINE, ketotifen, labetalol, LORazepam, ondansetron (ZOFRAN) IV, polyethylene glycol, prochlorperazine, sodium chloride flush   INTERVAL EVENTS:   Status post endovascular recanalization yesterday with vascular surgery Transesophageal echo with small stranding on the aortic valve concerning for lambl's excrescence versus vegetation Afebrile Repeat blood cultures 1/6 no growth to date  SUBJECTIVE:   No acute complaints.  Wife is present in the room.  They want to ensure there is adequate wound care for his intragluteal left buttock wounds.  Review of Systems   Constitutional: Negative for chills and fever.  Respiratory: Negative.   Cardiovascular: Negative.   Gastrointestinal: Negative.   Skin:       + Gluteal wounds  All other systems reviewed and are negative.    OBJECTIVE:   Allergies  Allergen Reactions  . Codeine Hives and Itching  . Ofev [Nintedanib] Diarrhea    SEVERE DIARRHEA  . Other Diarrhea and Other (See Comments)    Esbriet causes elevated LFTs. D/C on 06/14/17 and SEVERE DIARRHEA    Blood pressure (!) 114/54, pulse 64, temperature 98.6 F (37 C), temperature source Oral, resp. rate (!) 21, height 6' (1.829 m), weight 119.2 kg, SpO2 100 %. Body mass index is 35.64 kg/m.  Physical Exam Constitutional:      General: He is not in acute distress.    Appearance: He is well-developed.  HENT:     Head: Normocephalic and atraumatic.  Cardiovascular:     Rate and Rhythm: Normal rate. Rhythm irregular.     Comments: Well-healing incision from prior CABG Pulmonary:     Effort: Pulmonary effort is normal. No respiratory distress.  Musculoskeletal:     Right lower leg: Edema present.     Left lower leg: Edema present.     Comments: Right foot wrapped.  Skin:    General: Skin is warm and dry.  Neurological:     General: No focal deficit present.     Mental Status: He is alert and oriented to person, place, and time.  Psychiatric:        Mood and  Affect: Mood normal.      Lab Results & Microbiology Lab Results  Component Value Date   WBC 10.4 12/11/2020   HGB 9.1 (L) 12/11/2020   HCT 28.1 (L) 12/11/2020   MCV 94.6 12/11/2020   PLT 174 12/11/2020    Lab Results  Component Value Date   NA 129 (L) 12/11/2020   K 3.9 12/11/2020   CO2 21 (L) 12/11/2020   GLUCOSE 114 (H) 12/11/2020   BUN 28 (H) 12/11/2020   CREATININE 1.55 (H) 12/11/2020   CALCIUM 7.4 (L) 12/11/2020   GFRNONAA 45 (L) 12/11/2020   GFRAA 53 (L) 07/17/2020    Lab Results  Component Value Date   ALT 17 12/09/2020   AST 19 12/09/2020    ALKPHOS 80 12/09/2020   BILITOT 1.0 12/09/2020     I have reviewed the micro and lab results in Epic.  Imaging CT FOOT RIGHT WO CONTRAST  Result Date: 12/09/2020 CLINICAL DATA:  Right foot pain and swelling in a diabetic patient. Skin ulceration at the distal fifth metatarsal. Question osteomyelitis. EXAM: CT OF THE RIGHT FOOT WITHOUT CONTRAST TECHNIQUE: Multidetector CT imaging of the right foot was performed according to the standard protocol. Multiplanar CT image reconstructions were also generated. COMPARISON:  None. FINDINGS: Bones/Joint/Cartilage No bony destructive change or periosteal reaction to suggest osteomyelitis is identified. No joint effusion is seen. Joint spaces are preserved. Small calcaneal spurs are noted. Ligaments Suboptimally assessed by CT. Muscles and Tendons No intramuscular fluid collection is identified. There is fatty atrophy of intrinsic musculature of the foot. No tendon tear. The distal Achilles tendon is mildly thickened compatible tendinopathy. Soft tissues There is subcutaneous edema over the dorsum of the foot. No radiopaque foreign body is identified. Skin ulceration adjacent to the fifth metatarsal head is noted. There is subcutaneous edema over the dorsum of the foot. IMPRESSION: Negative for CT evidence of osteomyelitis, abscess or septic joint. Skin ulceration adjacent to the fifth metatarsal. No abscess is identified. Subcutaneous edema about the dorsum of the foot is compatible with cellulitis or dependent change. Mildly thickened Achilles tendinopathy compatible with insertional tendinosis without tear. Electronically Signed   By: Inge Rise M.D.   On: 12/09/2020 14:53   PERIPHERAL VASCULAR CATHETERIZATION  Result Date: 12/10/2020 DATE OF SERVICE: 12/10/2020 PATIENT:  Shane [name]  80 y.o. male PRE-OPERATIVE DIAGNOSIS:  pvd POST-OPERATIVE DIAGNOSIS:  * No post-op diagnosis entered * PROCEDURE:  1) US guided LCFA access 2) Aortogram 3) RLE angiogram  (66mL iodinated contrast) 4) R superficial femoral / above-knee popliteal stenting (6x145mm, 6x150mm, 6x20mm x 2 Eluvia - total treatment length 327mm) SURGEON:  Surgeon(s) and Role:    * Cherre Robins, MD - Primary ASSISTANT: none ANESTHESIA:   local EBL: min BLOOD ADMINISTERED:none DRAINS: none LOCAL MEDICATIONS USED:  LIDOCAINE SPECIMEN:  none COUNTS: confirmed correct. TOURNIQUET:  * No tourniquets in log * PATIENT DISPOSITION:  PACU - hemodynamically stable.  Delay start of Pharmacological VTE agent (>24hrs) due to surgical blood loss or risk of bleeding: no INDICATION FOR PROCEDURE: Shane [name] is a 80 y.o. 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 After careful discussion of risks, benefits, and alternatives the patient was offered angiography. We specifically discussed access site complications and contrast nephropathy. The patient understood and wished to proceed. OPERATIVE FINDINGS: Aorta: no flow limiting disease Right iliac arteries: no flow limiting disease Left iliac arteries: no flow limiting disease  Right femoral arteries: SFA occluded just distal to origin. Reconstitution in distal SFA. Right popliteal arteries: Above knee popliteal artery with focal >70% stenosis. Right tibial trifurcation: Peroneal dominant flow to the foot. PT occludes in the calf. Right tibial vessels: Peroneal fills the foot briskly. Good pedal flow. DESCRIPTION OF PROCEDURE: After identification of the patient in the pre-operative holding area, the patient was transferred to the operating room. The patient was positioned supine on the operating room table. Anesthesia was induced. The groins was prepped and draped in standard fashion. A surgical pause was performed confirming correct patient, procedure, and operative location. The left groin was anesthetized with subcutaneous injection of 1% lidocaine. Using ultrasound  guidance, the left common femoral artery was accessed with micropuncture technique. Fluoroscopy was used to confirm cannulation over the femoral head. Sheathogram was not performed. The 52F sheath was upsized to 49F. An 035 glidewire advantage was advanced into the distal aorta. Over the wire an omni flush catheter was advanced to the level of L2. Aortogram was performed - see above for details. The right common iliac artery was selected with the 035 glidewire advantage. The wire was advanced into the superficial femoral artery. Over the wire the omni flush catheter was advanced into the external iliac artery. Selective angiography was performed - see above for details. The decision was made to intervene. The patient was heparinized with 10000 units of heparin. The sheath was exchanged for a 69F x 45cm sheath. Selective angiography of the left lower extremity was performed prior to intervention. The SFA and popliteal lesions were treated with stenting (6x154mm, 6x113mm, 6x21mm x 2 Eluvia). The stents were post dilated with a 5x229mm Mustang balloon. Completion angiography revealed resolution of the stenosis / occlusion. A perclose device was used to close the arteriotomy. Hemostasis was excellent upon completion. Upon completion of the case instrument and sharps counts were confirmed correct. The patient was transferred to the PACU in good condition. I was present for all portions of the procedure. [name]. Stanford Breed, MD [facility] Phone Number: [phone] 12/10/2020 6:36 PM        [facility] for [facility] [phone] pager 12/11/2020, 12:53 PM

## 2020-12-11 NOTE — Interval H&P Note (Signed)
History and Physical Interval Note:  12/11/2020 9:04 AM  Shane [name]  has presented today for surgery, with the diagnosis of BACTEREMIA.  The various methods of treatment have been discussed with the patient and family. After consideration of risks, benefits and other options for treatment, the patient has consented to  Procedure(s): TRANSESOPHAGEAL ECHOCARDIOGRAM (TEE) (N/A) as a surgical intervention.  The patient's history has been reviewed, patient examined, no change in status, stable for surgery.  I have reviewed the patient's chart and labs.  Questions were answered to the patient's satisfaction.    NPO for TEE for bacteremia. No issues swallowing. In AFL but not pursuing DCCV.   Shane [name] T. Audie Box, [place]  [address], [place], [place] [address] [phone]  9:05 AM

## 2020-12-11 NOTE — Telephone Encounter (Signed)
xxxxxx

## 2020-12-11 NOTE — Progress Notes (Signed)
PROGRESS NOTE   Shane [name]  ZOX:[id number]    DOB: [birth year]    DOA: 12/08/2020  PCP: Burnard Bunting, MD   I have briefly reviewed patients previous medical records in [facility].  Chief Complaint  Patient presents with  . Weakness  . Shortness of Breath  . Cough    Brief Narrative:  80 year old married male, IADLs, PMH of IPF (Dr. Chase Caller, pulmonology, gets infusions of Pamrevlumab 3 3 weeks), OSA on CPAP, CKD stage III AAA, DM, HTN, HLD, advanced heart block s/p PPM, CAD s/p CABG at [facility] 11/04/2020, moderate AS, discharged from [facility] 11/18/2020, slow recovery since, presented to ED due to 1 week or so history of increasing fatigue, chronic dyspnea, recent chills, low-grade fevers, frequent cough.  Chart review of OP notes indicate bouts of atrial tachycardia and A. fib since approximately 12/27 and device clinic from earlier on 1/4 indicates plans to initiate Eliquis.  Patient developed severe weakness, barely able to stand prompting ED visit.  Admitted for new onset A. fib/flutter, generalized weakness and malaise, fevers in the context of right foot wound.  Confirmed MSSA bacteremia, suspecting endocarditis.  Cardiology, orthopedics, vascular surgery and ID following.  S/p right superficial femoral/above-knee popliteal stent 1/6.  S/p TEE 1/7.   Assessment & Plan:  Principal Problem:   MSSA bacteremia Active Problems:   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HCC)   OSA on CPAP   Essential hypertension   Chronic kidney disease, stage 3a (HCC)   Coronary artery disease involving native heart without angina pectoris   Cardiac device in situ   Generalized weakness   Dehydration with hyponatremia   Atrial fibrillation, chronic (HCC)   Elevated troponin level not due myocardial infarction   Diabetic foot ulcer (Eugenio Saenz)   New onset atrial fib/flutter:  Cardiology following.  They do not think that his rate controlled flutter is playing a role in his acute  illness/symptoms.  As per the review of PPM: He has been in and out of A. fib since 11/26/2020 that has 100% burden since 12/02/2020.  No plans for DCCV at this time.  CHA2DS2Vasc is 5, started on Eliquis on 1/4.  Eliquis held for procedures and currently on IV heparin drip.   Generalized weakness and fatigue  Multifactorial due to dehydration from poor oral intake, AKI, infectious etiology/fevers, recent major surgery and atrial fib/flutter.  Treat underlying cause and monitor.  PT recommend home health at discharge  MSSA bacteremia, likely related to chronic right foot wound, with concern for endocarditis  Work-up: No leukocytosis.  CRP up at 8.3.  Lactate normal.  UA not suggestive of UTI.  CT chest without contrast shows no acute pathology.  Flu panel PCR and COVID-19 PCR negative.  Blood cultures positive for MSSA  ID following  TEE 1/7 shows small stranding on the aortic valve concerning for Lambl's excrescence versus vegetation.  Detailed report as below.  Continue cefazolin.  Per ID, given TEE findings recommend device removal followed by antibiotics prior to reimplantation.  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 Sharol Given, Orthopedics input appreciated.  CT of foot without abscess or osteomyelitis.  Suspects that patient may require fifth ray amputation.  PRAFO to protect his right heel and forefoot.  S/p aortogram, RLE angiogram, right superficial femoral/above-knee popliteal drug-eluting stenting 1/6.  VVS recommend aspirin, Plavix and Eliquis  Await orthopedic input regarding any interventions for wound  i.e. I&D etc. for now recommend local wound care to ulcer-paint with Betadine daily  Type II DM with renal complications, peripheral neuropathy and hypoglycemia  CBG 61 on 1/6 morning  Yesterday reduced Levemir to 15 units at bedtime  and change SSI to sensitive.  Better.  Continue to monitor closely.  Dehydration with hyponatremia  Secondary to poor oral intake and diuretics.  Dehydration resolved.  Stable hyponatremia?  Element of SIADH.  Having some cough, no overt volume overload otherwise.  Discontinued IV fluids.  CAD s/p CABG 11/04/2020/elevated troponin  No anginal symptoms.  Sternotomy scar seems to be healing well.  Continue aspirin, atorvastatin and metoprolol tartrate.  Slightly elevated troponin with flat trend not consistent with ACS.  Permanent pacemaker  Cardiology following.  Advanced heart block and symptomatic bradycardia indication.  Not pacemaker dependent  By interrogation 12/08/2020: AP 10% and VP 20%.  S/p TEE results as below.  Await Cardiology timing of pacemaker extraction.   Cardiology adjusting pacemaker parameters.  Idiopathic pulmonary fibrosis  Outpatient follow-up with pulmonology.  OSA on CPAP  Continue CPAP.  Essential hypertension  Controlled.  Continue metoprolol.  Acute kidney injury complicating stage III aCKD  Baseline creatinine prior to recent CABG seem to be in the 1.5-1.6 range.  However on 12/13 and 12/14, creatinine was down to 1.2 but back up to 1.5 on 12/30.  Creatinine stable, back down to 1.5 today.  Hold Lasix.  Avoid nephrotoxins.  Stopped IV fluids.  Continue to trend daily BMP  Consider resuming Lasix in a.m.  Wounds  Please see detailed note from Clarence RN on 1/5  Anemia of chronic disease  Hemoglobin stable in the 9 g range  Follow CBC and transfuse if hemoglobin 7 g or less per  Body mass index is 35.64 kg/m.   DVT prophylaxis:      Code Status: Full Code Family Communication: I discussed in detail with patient's spouse at bedside this afternoon, updated care and answered all questions. Disposition:  Status is: Inpatient  The patient will require care spanning > 2 midnights and should be moved to inpatient because:  Inpatient level of care appropriate due to severity of illness  Dispo: The patient is from: Home              Anticipated d/c is to: Home              Anticipated d/c date is: 3 days              Patient currently is not medically stable to d/c.        Consultants:   Cardiology Orthopedics/Dr. Sharol Given  Procedures:   None  Antimicrobials:    Anti-infectives (From admission, onward)   Start     Dose/Rate Route Frequency Ordered Stop   12/09/20 1430  ceFAZolin (ANCEF) IVPB 2g/100 mL premix        2 g 200 mL/hr over 30 Minutes Intravenous Every 8 hours 12/09/20 1418          Subjective:  Seen early afternoon post procedure.  Had some nonproductive cough but no dyspnea.  No other complaints reported.  Objective:   Vitals:   12/11/20 1015 12/11/20 1030 12/11/20 1100 12/11/20 1148  BP: (!) 134/59 (!) 131/39 (!) 130/57 (!) 114/54  Pulse: 64 63 69 64  Resp: 17 (!) 22 (!) 24 (!) 21  Temp:    98.6 F (37 C)  TempSrc:    Oral  SpO2: 96% 93% 100% 100%  Weight:  Height:        General exam: Pleasant elderly male, moderately built and obese lying comfortably propped up in bed without distress. Respiratory system: Occasional basal crackles but otherwise clear to auscultation.  No increased work of breathing.  Pacemaker box left upper anterior chest. Cardiovascular system: S1 and S2 heard, irregularly irregular.  No JVD, murmurs or pedal edema.  Telemetry personally reviewed: Atrial flutter with controlled ventricular rate Gastrointestinal system: Abdomen is nondistended, soft and nontender. No organomegaly or masses felt. Normal bowel sounds heard. Central nervous system: Alert and oriented.  No focal neurological deficits. Extremities: Symmetric 5 x 5 power. Skin: This was as examined on 1/4: Unable to palpate posterior tibial and dorsalis pedis and right lower extremity.  Foot drop noted.  Approximately 2 cm diameter right foot, lateral aspect of fifth metatarsal head wound  with scabbing, minimal periwound erythema but on squeezing, purulent/bloody drainage.  No tenderness. Psychiatry: Judgement and insight appear normal. Mood & affect appropriate.     Data Reviewed:   I have personally reviewed following labs and imaging studies   CBC: Recent Labs  Lab 12/08/20 1805 12/09/20 0304 12/10/20 0354 12/11/20 0436  WBC 10.2 8.2 11.9* 10.4  NEUTROABS 7.3 5.4  --   --   HGB 11.9* 10.7* 9.6* 9.1*  HCT 35.8* 32.4* 28.4* 28.1*  MCV 95.0 94.5 93.4 94.6  PLT 257 218 198 716    Basic Metabolic Panel: Recent Labs  Lab 12/08/20 2045 12/09/20 0304 12/10/20 0354 12/11/20 0436  NA  --  129* 131* 129*  K  --  4.6 4.2 3.9  CL  --  93* 96* 99  CO2  --  24 25 21*  GLUCOSE  --  153* 70 114*  BUN  --  37* 31* 28*  CREATININE  --  1.75* 1.78* 1.55*  CALCIUM  --  8.4* 7.9* 7.4*  MG 1.7 1.9  --   --     Liver Function Tests: Recent Labs  Lab 12/08/20 1805 12/09/20 0304  AST 20 19  ALT 19 17  ALKPHOS 90 80  BILITOT 1.2 1.0  PROT 7.3 6.5  ALBUMIN 3.0* 2.5*    CBG: Recent Labs  Lab 12/11/20 0803 12/11/20 1145 12/11/20 1612  GLUCAP 91 89 105*    Microbiology Studies:   Recent Results (from the past 240 hour(s))  Blood culture (routine x 2)     Status: Abnormal (Preliminary result)   Collection Time: 12/08/20  6:05 PM   Specimen: BLOOD  Result Value Ref Range Status   Specimen Description BLOOD RIGHT ANTECUBITAL  Final   Special Requests   Final    BOTTLES DRAWN AEROBIC AND ANAEROBIC Blood Culture adequate volume   Culture  Setup Time   Final    GRAM POSITIVE COCCI IN CLUSTERS IN BOTH AEROBIC AND ANAEROBIC BOTTLES Organism ID to follow CRITICAL RESULT CALLED TO, READ BACK BY AND VERIFIED WITH: A WOLFE PHARMD 1407 12/09/20 A BROWNING Performed at [facility], [place] [address], [place], [place] [address]    Culture STAPHYLOCOCCUS AUREUS (A)  Final   Report Status PENDING  Incomplete   Organism ID, Bacteria STAPHYLOCOCCUS AUREUS  Final       Susceptibility   Staphylococcus aureus - MIC*    CIPROFLOXACIN <=0.5 SENSITIVE Sensitive     ERYTHROMYCIN <=0.25 SENSITIVE Sensitive     GENTAMICIN <=0.5 SENSITIVE Sensitive     OXACILLIN 0.5 SENSITIVE Sensitive     TETRACYCLINE <=1 SENSITIVE Sensitive  VANCOMYCIN 1 SENSITIVE Sensitive     TRIMETH/SULFA <=10 SENSITIVE Sensitive     CLINDAMYCIN <=0.25 SENSITIVE Sensitive     RIFAMPIN <=0.5 SENSITIVE Sensitive     Inducible Clindamycin NEGATIVE Sensitive     * STAPHYLOCOCCUS AUREUS  Blood culture (routine x 2)     Status: Abnormal (Preliminary result)   Collection Time: 12/08/20  6:05 PM   Specimen: BLOOD  Result Value Ref Range Status   Specimen Description BLOOD LEFT ANTECUBITAL  Final   Special Requests   Final    BOTTLES DRAWN AEROBIC AND ANAEROBIC Blood Culture adequate volume   Culture  Setup Time   Final    GRAM POSITIVE COCCI IN CLUSTERS IN BOTH AEROBIC AND ANAEROBIC BOTTLES CRITICAL VALUE NOTED.  VALUE IS CONSISTENT WITH PREVIOUSLY REPORTED AND CALLED VALUE.    Culture (A)  Final    STAPHYLOCOCCUS AUREUS SUSCEPTIBILITIES PERFORMED ON PREVIOUS CULTURE WITHIN THE LAST 5 DAYS. Performed at [facility], [place] [address], [place], [place] [address]    Report Status PENDING  Incomplete  Resp Panel by RT-PCR (Flu A&B, Covid) Nasopharyngeal Swab     Status: None   Collection Time: 12/08/20  6:05 PM   Specimen: Nasopharyngeal Swab; Nasopharyngeal(NP) swabs in vial transport medium  Result Value Ref Range Status   SARS Coronavirus 2 by RT PCR NEGATIVE NEGATIVE Final    Comment: (NOTE) SARS-CoV-2 target nucleic acids are NOT DETECTED.  The SARS-CoV-2 RNA is generally detectable in upper respiratory specimens during the acute phase of infection. The lowest concentration of SARS-CoV-2 viral copies this assay can detect is 138 copies/mL. A negative result does not preclude SARS-Cov-2 infection and should not be used as the sole basis for treatment or other patient  management decisions. A negative result may occur with  improper specimen collection/handling, submission of specimen other than nasopharyngeal swab, presence of viral mutation(s) within the areas targeted by this assay, and inadequate number of viral copies(<138 copies/mL). A negative result must be combined with clinical observations, patient history, and epidemiological information. The expected result is Negative.  Fact Sheet for Patients:  EntrepreneurPulse.com.au  Fact Sheet for Healthcare Providers:  IncredibleEmployment.be  This test is no t yet approved or cleared by the [place] FDA and  has been authorized for detection and/or diagnosis of SARS-CoV-2 by FDA under an Emergency Use Authorization (EUA). This EUA will remain  in effect (meaning this test can be used) for the duration of the COVID-19 declaration under Section 564(b)(1) of the Act, 21 U.S.C.section 360bbb-3(b)(1), unless the authorization is terminated  or revoked sooner.       Influenza A by PCR NEGATIVE NEGATIVE Final   Influenza B by PCR NEGATIVE NEGATIVE Final    Comment: (NOTE) The Xpert Xpress SARS-CoV-2/FLU/RSV plus assay is intended as an aid in the diagnosis of influenza from Nasopharyngeal swab specimens and should not be used as a sole basis for treatment. Nasal washings and aspirates are unacceptable for Xpert Xpress SARS-CoV-2/FLU/RSV testing.  Fact Sheet for Patients: EntrepreneurPulse.com.au  Fact Sheet for Healthcare Providers: IncredibleEmployment.be  This test is not yet approved or cleared by the [place] FDA and has been authorized for detection and/or diagnosis of SARS-CoV-2 by FDA under an Emergency Use Authorization (EUA). This EUA will remain in effect (meaning this test can be used) for the duration of the COVID-19 declaration under Section 564(b)(1) of the Act, 21 U.S.C. section 360bbb-3(b)(1),  unless the authorization is terminated or revoked.  Performed at [facility]  Lab, [address], [place], [place] [address]   Blood Culture ID Panel (Reflexed)     Status: Abnormal   Collection Time: 12/08/20  6:05 PM  Result Value Ref Range Status   Enterococcus faecalis NOT DETECTED NOT DETECTED Final   Enterococcus Faecium NOT DETECTED NOT DETECTED Final   Listeria monocytogenes NOT DETECTED NOT DETECTED Final   Staphylococcus species DETECTED (A) NOT DETECTED Final    Comment: CRITICAL RESULT CALLED TO, READ BACK BY AND VERIFIED WITH: A WOLFE PHARMD 1407 12/09/20 A BROWNING    Staphylococcus aureus (BCID) DETECTED (A) NOT DETECTED Final    Comment: CRITICAL RESULT CALLED TO, READ BACK BY AND VERIFIED WITH: A WOLFE PHARMD 1407 12/09/20 A BROWNING    Staphylococcus epidermidis NOT DETECTED NOT DETECTED Final   Staphylococcus lugdunensis NOT DETECTED NOT DETECTED Final   Streptococcus species NOT DETECTED NOT DETECTED Final   Streptococcus agalactiae NOT DETECTED NOT DETECTED Final   Streptococcus pneumoniae NOT DETECTED NOT DETECTED Final   Streptococcus pyogenes NOT DETECTED NOT DETECTED Final   A.calcoaceticus-baumannii NOT DETECTED NOT DETECTED Final   Bacteroides fragilis NOT DETECTED NOT DETECTED Final   Enterobacterales NOT DETECTED NOT DETECTED Final   Enterobacter cloacae complex NOT DETECTED NOT DETECTED Final   Escherichia coli NOT DETECTED NOT DETECTED Final   Klebsiella aerogenes NOT DETECTED NOT DETECTED Final   Klebsiella oxytoca NOT DETECTED NOT DETECTED Final   Klebsiella pneumoniae NOT DETECTED NOT DETECTED Final   Proteus species NOT DETECTED NOT DETECTED Final   Salmonella species NOT DETECTED NOT DETECTED Final   Serratia marcescens NOT DETECTED NOT DETECTED Final   Haemophilus influenzae NOT DETECTED NOT DETECTED Final   Neisseria meningitidis NOT DETECTED NOT DETECTED Final   Pseudomonas aeruginosa NOT DETECTED NOT DETECTED Final   Stenotrophomonas  maltophilia NOT DETECTED NOT DETECTED Final   Candida albicans NOT DETECTED NOT DETECTED Final   Candida auris NOT DETECTED NOT DETECTED Final   Candida glabrata NOT DETECTED NOT DETECTED Final   Candida krusei NOT DETECTED NOT DETECTED Final   Candida parapsilosis NOT DETECTED NOT DETECTED Final   Candida tropicalis NOT DETECTED NOT DETECTED Final   Cryptococcus neoformans/gattii NOT DETECTED NOT DETECTED Final   Meth resistant mecA/C and MREJ NOT DETECTED NOT DETECTED Final    Comment: Performed at [facility], [address], [place], [place] [address]     Radiology Studies:  PERIPHERAL VASCULAR CATHETERIZATION  Result Date: 12/10/2020 DATE OF SERVICE: 12/10/2020 PATIENT:  Candise Bowens [place]  80 y.o. male PRE-OPERATIVE DIAGNOSIS:  pvd POST-OPERATIVE DIAGNOSIS:  * No post-op diagnosis entered * PROCEDURE:  1) US guided LCFA access 2) Aortogram 3) RLE angiogram (57mL iodinated contrast) 4) R superficial femoral / above-knee popliteal stenting (6x174mm, 6x176mm, 6x4mm x 2 Eluvia - total treatment length 373mm) SURGEON:  Surgeon(s) and Role:    * Cherre Robins, MD - Primary ASSISTANT: none ANESTHESIA:   local EBL: min BLOOD ADMINISTERED:none DRAINS: none LOCAL MEDICATIONS USED:  LIDOCAINE SPECIMEN:  none COUNTS: confirmed correct. TOURNIQUET:  * No tourniquets in log * PATIENT DISPOSITION:  PACU - hemodynamically stable.  Delay start of Pharmacological VTE agent (>24hrs) due to surgical blood loss or risk of bleeding: no INDICATION FOR PROCEDURE: Hence Derrick is a 80 y.o. 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 After careful discussion of risks, benefits, and alternatives the patient was offered angiography. We specifically discussed access site complications  and contrast nephropathy. The patient understood and wished to proceed. OPERATIVE FINDINGS: Aorta: no flow limiting disease Right  iliac arteries: no flow limiting disease Left iliac arteries: no flow limiting disease Right femoral arteries: SFA occluded just distal to origin. Reconstitution in distal SFA. Right popliteal arteries: Above knee popliteal artery with focal >70% stenosis. Right tibial trifurcation: Peroneal dominant flow to the foot. PT occludes in the calf. Right tibial vessels: Peroneal fills the foot briskly. Good pedal flow. DESCRIPTION OF PROCEDURE: After identification of the patient in the pre-operative holding area, the patient was transferred to the operating room. The patient was positioned supine on the operating room table. Anesthesia was induced. The groins was prepped and draped in standard fashion. A surgical pause was performed confirming correct patient, procedure, and operative location. The left groin was anesthetized with subcutaneous injection of 1% lidocaine. Using ultrasound guidance, the left common femoral artery was accessed with micropuncture technique. Fluoroscopy was used to confirm cannulation over the femoral head. Sheathogram was not performed. The 98F sheath was upsized to 9F. An 035 glidewire advantage was advanced into the distal aorta. Over the wire an omni flush catheter was advanced to the level of L2. Aortogram was performed - see above for details. The right common iliac artery was selected with the 035 glidewire advantage. The wire was advanced into the superficial femoral artery. Over the wire the omni flush catheter was advanced into the external iliac artery. Selective angiography was performed - see above for details. The decision was made to intervene. The patient was heparinized with 10000 units of heparin. The sheath was exchanged for a 39F x 45cm sheath. Selective angiography of the left lower extremity was performed prior to intervention. The SFA and popliteal lesions were treated with stenting (6x186mm, 6x132mm, 6x37mm x 2 Eluvia). The stents were post dilated with a 5x264mm Mustang  balloon. Completion angiography revealed resolution of the stenosis / occlusion. A perclose device was used to close the arteriotomy. Hemostasis was excellent upon completion. Upon completion of the case instrument and sharps counts were confirmed correct. The patient was transferred to the PACU in good condition. I was present for all portions of the procedure. [name]. Stanford Breed, MD [facility] Phone Number: [phone] 12/10/2020 6:36 PM   ECHO TEE  Result Date: 12/11/2020    TRANSESOPHOGEAL ECHO REPORT   Patient Name:   SYLIS KETCHUM Date of Exam: 12/11/2020 Medical Rec #:  [id number]        Height:       72.0 in Accession #:    [id number]       Weight:       262.8 lb Date of Birth:  Aug 06, 1941        BSA:          2.393 m Patient Age:    89 years         BP:           114/54 mmHg Patient Gender: M                HR:           83 bpm. Exam Location:  Inpatient Procedure: 2D Echo, 3D Echo, Transesophageal Echo and Color Doppler Indications:     Bacteremia  History:         Patient has prior history of Echocardiogram examinations, most                  recent  03/29/2020. Risk Factors:Sleep Apnea.  Sonographer:     Clayton Lefort RDCS (AE) Referring Phys:  [id number] [place] Diagnosing Phys: Eleonore Chiquito MD PROCEDURE: After discussion of the risks and benefits of a TEE, an informed consent was obtained from the patient. TEE procedure time was 33 minutes. The transesophogeal probe was passed without difficulty through the esophogus of the patient. Imaged were obtained with the patient in a left lateral decubitus position. Local oropharyngeal anesthetic was provided with Cetacaine. Sedation performed by different physician. The patient was monitored while under deep sedation. Anesthestetic sedation was provided intravenously by Anesthesiology: 587mg  of Propofol, 60mg  of Lidocaine. Image quality was excellent. The patient's vital signs; including heart rate, blood pressure,  and oxygen saturation; remained stable throughout the procedure. The patient developed no complications during the procedure. IMPRESSIONS  1. There is small fibrous stranding attached to the leaflet tips of the aortic valve that could be related to valve degeneration. 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 In the setting of bacteremia, cannot exclude endocarditis. The stranding is small (0.5 cm x 0.2 cm) and there is no significant regurgitaiton of the aortic valve.  2. There is no vegetation seen on the pacemaker leads.  3. The aortic valve is trileaflet with moderate calcification and moderately reduced motion. 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 There is mild aortic stenosis. Vmax 2.2 m/s, MG 12.5 mmHG, AVA 1.65 cm2. The aortic valve is tricuspid. There is moderate calcification of the aortic valve. There is moderate thickening of the aortic valve. Aortic valve regurgitation is not visualized. Mild aortic valve stenosis.  4. Left ventricular ejection fraction, by estimation, is 50 to 55%. The left ventricle has low normal function.  5. Right ventricular systolic function is moderately reduced. The right ventricular size is moderately enlarged.  6. Left atrial size was mildly dilated. No left atrial/left atrial appendage thrombus was detected. The LAA emptying velocity was 49 cm/s.  7. The mitral valve is grossly normal. Mild mitral valve regurgitation. No evidence of mitral stenosis.  8. Moderate to severe tricuspid regurgitation is present. 2D PISA 0.9 cm. 2D VC 0.5 cm, 2D ERO 0.44 cm2, R vol 36 cc. The tricuspid valve is abnormal. Tricuspid valve regurgitation is moderate to severe. FINDINGS  Left Ventricle: Left ventricular ejection fraction, by estimation, is 50 to 55%. The left ventricle has low normal function. The left ventricular internal cavity size was normal in size. Right Ventricle: The right ventricular size is moderately enlarged. No increase in right ventricular wall thickness. Right  ventricular systolic function is moderately reduced. Left Atrium: Left atrial size was mildly dilated. No left atrial/left atrial appendage thrombus was detected. The LAA emptying velocity was 49 cm/s. Right Atrium: Right atrial size was normal in size. Pericardium: There is no evidence of pericardial effusion. Mitral Valve: The mitral valve is grossly normal. Mild mitral valve regurgitation. No evidence of mitral valve stenosis. There is no evidence of mitral valve vegetation. Tricuspid Valve: Moderate to severe tricuspid regurgitation is present. 2D PISA 0.9 cm. 2D VC 0.5 cm, 2D ERO 0.44 cm2, R vol 36 cc. The tricuspid valve is abnormal. Tricuspid valve regurgitation is moderate to severe. No evidence of tricuspid stenosis. There is no evidence of tricuspid valve vegetation. Aortic Valve: The aortic valve is trileaflet with moderate calcification and moderately reduced motion. 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 There is mild aortic stenosis. Vmax 2.2 m/s, MG 12.5 mmHG, AVA 1.65 cm2. The aortic valve is tricuspid. There is moderate  calcification of the aortic valve. There is moderate thickening of the aortic valve. Aortic valve regurgitation is not visualized. Mild aortic stenosis is present. Aortic valve mean gradient measures 12.5 mmHg. Aortic valve peak gradient measures 19.9 mmHg. Aortic valve area, by VTI measures 1.65 cm. Pulmonic Valve: The pulmonic valve was grossly normal. Pulmonic valve regurgitation is trivial. No evidence of pulmonic stenosis. There is no evidence of pulmonic valve vegetation. Aorta: The aortic root is normal in size and structure. Venous: The right upper pulmonary vein, right lower pulmonary vein, left lower pulmonary vein and left upper pulmonary vein are normal. IAS/Shunts: The interatrial septum appears to be lipomatous. No atrial level shunt detected by color flow Doppler. Additional Comments: A pacer wire is visualized in the right atrium and right ventricle.  LEFT VENTRICLE  PLAX 2D LVOT diam:     2.45 cm LV SV:         69 LV SV Index:   29 LVOT Area:     4.71 cm  AORTIC VALVE AV Area (Vmax):    1.89 cm AV Area (Vmean):   1.70 cm AV Area (VTI):     1.65 cm AV Vmax:           223.00 cm/s AV Vmean:          166.000 cm/s AV VTI:            0.420 m AV Peak Grad:      19.9 mmHg AV Mean Grad:      12.5 mmHg LVOT Vmax:         89.40 cm/s LVOT Vmean:        59.900 cm/s LVOT VTI:          0.147 m LVOT/AV VTI ratio: 0.35  AORTA Ao Root diam: 3.60 cm Ao Asc diam:  3.90 cm TRICUSPID VALVE TR Peak grad:   47.9 mmHg TR Mean grad:   22.0 mmHg TR Vmax:        346.00 cm/s TR Vmean:       208.0 cm/s  SHUNTS Systemic VTI:  0.15 m Systemic Diam: 2.45 cm Eleonore Chiquito MD Electronically signed by Eleonore Chiquito MD Signature Date/Time: 12/11/2020/2:01:33 PM    Final      Scheduled Meds:   . aspirin EC  81 mg Oral Daily  . atorvastatin  40 mg Oral Daily  . [START ON 12/12/2020] clopidogrel  75 mg Oral Q breakfast  . insulin aspart  0-9 Units Subcutaneous TID WC  . insulin aspart  7 Units Subcutaneous TID WC  . insulin detemir  15 Units Subcutaneous QHS  . metoprolol tartrate  25 mg Oral BID  . multivitamin with minerals  1 tablet Oral Daily  . pantoprazole  40 mg Oral Daily  . Ensure Max Protein  11 oz Oral BID  . sodium chloride flush  3 mL Intravenous Q12H    Continuous Infusions:   . sodium chloride    .  ceFAZolin (ANCEF) IV 2 g (12/11/20 1449)  . heparin 1,450 Units/hr (12/11/20 1300)     LOS: 2 days     Vernell Leep, MD, [place], [facility]. [facility]    To contact the attending provider between 7A-7P or the covering provider during after hours 7P-7A, please log into the web site [email or web address] and access using universal [place] password for that web site. If you do not have the password, please call the hospital operator.  12/11/2020, 6:02 PM

## 2020-12-11 NOTE — Transfer of Care (Signed)
Immediate Anesthesia Transfer of Care Note  Patient: Shane [name]  Procedure(s) Performed: TRANSESOPHAGEAL ECHOCARDIOGRAM (TEE) (N/A )  Patient Location: Endoscopy Unit  Anesthesia Type:MAC  Level of Consciousness: drowsy and patient cooperative  Airway & Oxygen Therapy: Patient Spontanous Breathing and Patient connected to face mask oxygen  Post-op Assessment: Report given to RN and Post -op Vital signs reviewed and stable  Post vital signs: Reviewed and stable  Last Vitals:  Vitals Value Taken Time  BP 106/38   Temp    Pulse    Resp    SpO2 98     Last Pain:  Vitals:   12/11/20 0909  TempSrc: Temporal  PainSc: 0-No pain         Complications: No complications documented.

## 2020-12-11 NOTE — Progress Notes (Addendum)
Electrophysiology Rounding Note  Patient Name: Shane [name] Date of Encounter: 12/11/2020  Primary Cardiologist: Kirk Ruths, MD Electrophysiologist: Cristopher Peru, MD   Subjective   The patient is doing well today.  At this time, the patient denies chest pain, shortness of breath, or any new concerns.  Inpatient Medications    Scheduled Meds:  aspirin EC  81 mg Oral Daily   atorvastatin  40 mg Oral Daily   [START ON 12/12/2020] clopidogrel  75 mg Oral Q breakfast   insulin aspart  0-9 Units Subcutaneous TID WC   insulin aspart  7 Units Subcutaneous TID WC   insulin detemir  15 Units Subcutaneous QHS   metoprolol tartrate  25 mg Oral BID   pantoprazole  40 mg Oral Daily   sodium chloride flush  3 mL Intravenous Q12H   Continuous Infusions:  sodium chloride Stopped (12/10/20 2359)   sodium chloride     sodium chloride     sodium chloride 1 mL/kg/hr (12/11/20 0607)    ceFAZolin (ANCEF) IV 2 g (12/11/20 0609)   heparin 1,350 Units/hr (12/11/20 0306)   PRN Meds: sodium chloride, acetaminophen, albuterol, hydrALAZINE, ketotifen, labetalol, LORazepam, ondansetron (ZOFRAN) IV, polyethylene glycol, prochlorperazine, sodium chloride flush   Vital Signs    Vitals:   12/10/20 1917 12/11/20 0216 12/11/20 0520 12/11/20 0521  BP: (!) 142/51   (!) 111/50  Pulse: 64 66  64  Resp: 20 16  20   Temp: 98.8 F (37.1 C)   99.3 F (37.4 C)  TempSrc: Oral   Oral  SpO2: 100% 100%  100%  Weight:   119.2 kg   Height:        Intake/Output Summary (Last 24 hours) at 12/11/2020 0753 Last data filed at 12/11/2020 0520 Gross per 24 hour  Intake 1730.15 ml  Output 777 ml  Net 953.15 ml   Filed Weights   12/10/20 0024 12/10/20 0555 12/11/20 0520  Weight: 118 kg 111 kg 119.2 kg    Physical Exam    GEN- The patient is well appearing, alert and oriented x 3 today.   Head- normocephalic, atraumatic Eyes-  Sclera clear, conjunctiva pink Ears- hearing intact Oropharynx- clear Neck-  supple Lungs- Clear to ausculation bilaterally, normal work of breathing Heart- Regular rate and rhythm, no murmurs, rubs or gallops GI- soft, NT, ND, + BS Extremities- no clubbing or cyanosis. No edema Skin- no rash or lesion Psych- euthymic mood, full affect Neuro- strength and sensation are intact  Labs    CBC Recent Labs    12/08/20 1805 12/09/20 0304 12/10/20 0354 12/11/20 0436  WBC 10.2 8.2 11.9* 10.4  NEUTROABS 7.3 5.4  --   --   HGB 11.9* 10.7* 9.6* 9.1*  HCT 35.8* 32.4* 28.4* 28.1*  MCV 95.0 94.5 93.4 94.6  PLT 257 218 198 425   Basic Metabolic Panel Recent Labs    12/08/20 2045 12/09/20 0304 12/10/20 0354 12/11/20 0436  NA  --  129* 131* 129*  K  --  4.6 4.2 3.9  CL  --  93* 96* 99  CO2  --  24 25 21*  GLUCOSE  --  153* 70 114*  BUN  --  37* 31* 28*  CREATININE  --  1.75* 1.78* 1.55*  CALCIUM  --  8.4* 7.9* 7.4*  MG 1.7 1.9  --   --    Liver Function Tests Recent Labs    12/08/20 1805 12/09/20 0304  AST 20 19  ALT 19 17  ALKPHOS 90 80  BILITOT 1.2 1.0  PROT 7.3 6.5  ALBUMIN 3.0* 2.5*   No results for input(s): LIPASE, AMYLASE in the last 72 hours. Cardiac Enzymes No results for input(s): CKTOTAL, CKMB, CKMBINDEX, TROPONINI in the last 72 hours.   Telemetry    AF/AFL with controlled rates in 60-70s (personally reviewed)  Radiology    CT FOOT RIGHT WO CONTRAST  Result Date: 12/09/2020 CLINICAL DATA:  Right foot pain and swelling in a diabetic patient. Skin ulceration at the distal fifth metatarsal. Question osteomyelitis. EXAM: CT OF THE RIGHT FOOT WITHOUT CONTRAST TECHNIQUE: Multidetector CT imaging of the right foot was performed according to the standard protocol. Multiplanar CT image reconstructions were also generated. COMPARISON:  None. FINDINGS: Bones/Joint/Cartilage No bony destructive change or periosteal reaction to suggest osteomyelitis is identified. No joint effusion is seen. Joint spaces are preserved. Small calcaneal spurs are  noted. Ligaments Suboptimally assessed by CT. Muscles and Tendons No intramuscular fluid collection is identified. There is fatty atrophy of intrinsic musculature of the foot. No tendon tear. The distal Achilles tendon is mildly thickened compatible tendinopathy. Soft tissues There is subcutaneous edema over the dorsum of the foot. No radiopaque foreign body is identified. Skin ulceration adjacent to the fifth metatarsal head is noted. There is subcutaneous edema over the dorsum of the foot. IMPRESSION: Negative for CT evidence of osteomyelitis, abscess or septic joint. Skin ulceration adjacent to the fifth metatarsal. No abscess is identified. Subcutaneous edema about the dorsum of the foot is compatible with cellulitis or dependent change. Mildly thickened Achilles tendinopathy compatible with insertional tendinosis without tear. Electronically Signed   By: Inge Rise M.D.   On: 12/09/2020 14:53   PERIPHERAL VASCULAR CATHETERIZATION  Result Date: 12/10/2020 DATE OF SERVICE: 12/10/2020 PATIENT:  Shane [name]  80 y.o. male PRE-OPERATIVE DIAGNOSIS:  pvd POST-OPERATIVE DIAGNOSIS:  * No post-op diagnosis entered * PROCEDURE:  1) US guided LCFA access 2) Aortogram 3) RLE angiogram (11mL iodinated contrast) 4) R superficial femoral / above-knee popliteal stenting (6x19mm, 6x163mm, 6x63mm x 2 Eluvia - total treatment length 338mm) SURGEON:  Surgeon(s) and Role:    * Cherre Robins, MD - Primary ASSISTANT: none ANESTHESIA:   local EBL: min BLOOD ADMINISTERED:none DRAINS: none LOCAL MEDICATIONS USED:  LIDOCAINE SPECIMEN:  none COUNTS: confirmed correct. TOURNIQUET:  * No tourniquets in log * PATIENT DISPOSITION:  PACU - hemodynamically stable.  Delay start of Pharmacological VTE agent (>24hrs) due to surgical blood loss or risk of bleeding: no INDICATION FOR PROCEDURE: Shane [name] is a 80 y.o. male with critical limb ischemia of the right lower extremity (atherosclerosis of native arteries of right lower  extremity causing ulceration). After careful discussion of risks, benefits, and alternatives the patient was offered angiography. We specifically discussed access site complications and contrast nephropathy. The patient understood and wished to proceed. OPERATIVE FINDINGS: Aorta: no flow limiting disease Right iliac arteries: no flow limiting disease Left iliac arteries: no flow limiting disease Right femoral arteries: SFA occluded just distal to origin. Reconstitution in distal SFA. Right popliteal arteries: Above knee popliteal artery with focal >70% stenosis. Right tibial trifurcation: Peroneal dominant flow to the foot. PT occludes in the calf. Right tibial vessels: Peroneal fills the foot briskly. Good pedal flow. DESCRIPTION OF PROCEDURE: After identification of the patient in the pre-operative holding area, the patient was transferred to the operating room. The patient was positioned supine on the operating room table. Anesthesia was induced. The groins was prepped and draped in  standard fashion. A surgical pause was performed confirming correct patient, procedure, and operative location. The left groin was anesthetized with subcutaneous injection of 1% lidocaine. Using ultrasound guidance, the left common femoral artery was accessed with micropuncture technique. Fluoroscopy was used to confirm cannulation over the femoral head. Sheathogram was not performed. The 31F sheath was upsized to 31F. An 035 glidewire advantage was advanced into the distal aorta. Over the wire an omni flush catheter was advanced to the level of L2. Aortogram was performed - see above for details. The right common iliac artery was selected with the 035 glidewire advantage. The wire was advanced into the superficial femoral artery. Over the wire the omni flush catheter was advanced into the external iliac artery. Selective angiography was performed - see above for details. The decision was made to intervene. The patient was heparinized  with 10000 units of heparin. The sheath was exchanged for a 43F x 45cm sheath. Selective angiography of the left lower extremity was performed prior to intervention. The SFA and popliteal lesions were treated with stenting (6x123mm, 6x158mm, 6x47mm x 2 Eluvia). The stents were post dilated with a 5x220mm Mustang balloon. Completion angiography revealed resolution of the stenosis / occlusion. A perclose device was used to close the arteriotomy. Hemostasis was excellent upon completion. Upon completion of the case instrument and sharps counts were confirmed correct. The patient was transferred to the PACU in good condition. I was present for all portions of the procedure. [name]. Stanford Breed, MD [facility] Phone Number: [phone] 12/10/2020 6:36 PM   VAS [place] ABI WITH/WO TBI  Result Date: 12/09/2020 LOWER EXTREMITY DOPPLER STUDY Indications: Ulceration, and Chronic ulcer to RLE forefoot. High Risk         Hypertension, hyperlipidemia, Diabetes, coronary artery Factors:          disease. Other Factors: AFIB, CKD3, PM.  Comparison Study: No previous exams. Performing Technologist: Rogelia Rohrer RVT, RDMS  Examination Guidelines: A complete evaluation includes at minimum, Doppler waveform signals and systolic blood pressure reading at the level of bilateral brachial, anterior tibial, and posterior tibial arteries, when vessel segments are accessible. Bilateral testing is considered an integral part of a complete examination. Photoelectric Plethysmograph (PPG) waveforms and toe systolic pressure readings are included as required and additional duplex testing as needed. Limited examinations for reoccurring indications may be performed as noted.  ABI Findings: +---------+------------------+-----+-------------------+----------------+ Right    Rt Pressure (mmHg)IndexWaveform           Comment          +---------+------------------+-----+-------------------+----------------+ Brachial  122                    biphasic                            +---------+------------------+-----+-------------------+----------------+ PTA                             dampened monophasicnon compressible +---------+------------------+-----+-------------------+----------------+ DP       80                0.62 dampened monophasic                 +---------+------------------+-----+-------------------+----------------+ Great Toe0                 0.00 Absent                              +---------+------------------+-----+-------------------+----------------+ +---------+------------------+-----+----------+----------------+  Left     Lt Pressure (mmHg)IndexWaveform  Comment          +---------+------------------+-----+----------+----------------+ Brachial 130                    triphasic                  +---------+------------------+-----+----------+----------------+ PTA                             biphasic  non compressible +---------+------------------+-----+----------+----------------+ DP       81                0.62 monophasic                 +---------+------------------+-----+----------+----------------+ Great Toe47                0.36 Abnormal                   +---------+------------------+-----+----------+----------------+ +-------+-----------+-----------+------------+------------+ ABI/TBIToday's ABIToday's TBIPrevious ABIPrevious TBI +-------+-----------+-----------+------------+------------+ Right  0.62       0                                   +-------+-----------+-----------+------------+------------+ Left   0.62       0.36                                +-------+-----------+-----------+------------+------------+  Summary: Right: Resting right ankle-brachial index indicates moderate right lower extremity arterial disease. The right toe-brachial index is abnormal. ABI's likely falsely elevated due to vessel calcification. Left: Resting left  ankle-brachial index indicates moderate left lower extremity arterial disease. The left toe-brachial index is abnormal.  *See table(s) above for measurements and observations.  Electronically signed by Jamelle Haring on 12/09/2020 at 12:28:38 PM.    Final     Patient Profile     80 y.o. male with a hx of ideopathic Pulmonary fibrosis (Dr. Chase Caller, gets infusions of pamrevlumab every 3 weeks), sleep apnea,  CKD (III), DM, HTN, HLD, OSA w/CPAP, advanced heart block w/PPM, CAD (CABG (LIMA-LAD, SVG-Om1, SVG-PDA) on 11/05/20 at [facility] by Dr. Lincoln Brigham, discharged 11/16/20 ) VHD (mod AS), new AFlutter admitted with generalized malaise, febrile illness   Found with MSSA bacteremia, R foot wounds, and PVD     Device information  Biotronik PPM implanted 03/30/20 Interrogation from 12/08/20 was reviewed Initial AF detection was 11/26/20 In review with industry, has been 100% AF since 12/02/20  Assessment & Plan    1. New onset AFib/flutter     CHA2DS2Vasc is 5, started on Eliquis, appropriately dosed, started 12/08/20     Not unusual post CABG      Rates remain controlled.      Updated EKG pending to get a better look at flutter, though by tele does not look typical, prior EKG of poor quality     His [place] will need interruption and no plans currently for DCCV      2. Generalized malaise, weakness 3. Reports of fever at home of about 100 or so     Tmax here in last 24hrs 100.9 4. R foot wound     No signs of osteo by CT 5. PVD     Ortho and vascular are on board     Planned for aortogram and possible intervention today  6. MSSA bacteremia (2/2 BC were  +)     ID on board     Intermittent chills   7. CAD     CABG Dec 2021     No anginal symptoms      On ASA, statin, BB   8. PPM     Advanced heart block and symptomatic bradycardia indication      not dependent     By interrogation 12/08/20 he AP 10% and VP 20%     Planning for TEE this am.      He will likely need device removal     He is pacing a  fair amount on telemetry, I have asked Biotronik to reduce his pacing rate to 45 and evaluate pacing needs     Will discuss timing with Dr. Lovena Le, early next week likely. His device was placed 03/30/2020 so can be explanted in Cath lab.     Continue lower dose of metoprolol at 25mg  BID  For questions or updates, please contact [place] Please consult [email or web address] for contact info under Cardiology/STEMI.  Signed, Shirley Friar, PA-C  12/11/2020, 7:53 AM   EP Attending  Patient seen and examined. Agree with the findings as noted above. The patient looks a little better today. He will undergo TEE. His fever is better. I would imagine a lead extraction would be recommended with MSSA.  Carleene Overlie Surina Storts,MD

## 2020-12-11 NOTE — Progress Notes (Signed)
   ASSESSMENT & PLAN:  Shane [name] is a 80 y.o. male with atherosclerosis of native arteries of right lower extremity causing ulceration. Status post endovascular recanalization of right superficial femoral artery occlusion with drug-eluting stenting. Continue ASA / Eliquis / Statin. Continue local wound care to ulcer - paint with betadine daily. Monitor renal function. Please call for questions over the weekend. I will check back in Monday.  SUBJECTIVE:  Right foot warm. Feels better. No complaints in the groin.  OBJECTIVE:  BP (!) 131/39   Pulse 63   Temp 97.7 F (36.5 C) (Temporal)   Resp (!) 22   Ht 6' (1.829 m)   Wt 119.2 kg   SpO2 93%   BMI 35.64 kg/m   Intake/Output Summary (Last 24 hours) at 12/11/2020 1040 Last data filed at 12/11/2020 1005 Gross per 24 hour  Intake 2430.15 ml  Output 927 ml  Net 1503.15 ml    Constitutional: well appearing. no acute distress. Cardiac: irregular rhythm Vascular: warm, pink, edematous right foot. Ulcer unchanged. Left groin soft.   CBC Latest Ref Rng & Units 12/11/2020 12/10/2020 12/09/2020  WBC 4.0 - 10.5 K/uL 10.4 11.9(H) 8.2  Hemoglobin 13.0 - 17.0 g/dL 9.1(L) 9.6(L) 10.7(L)  Hematocrit 39.0 - 52.0 % 28.1(L) 28.4(L) 32.4(L)  Platelets 150 - 400 K/uL 174 198 218     CMP Latest Ref Rng & Units 12/11/2020 12/10/2020 12/09/2020  Glucose 70 - 99 mg/dL 114(H) 70 153(H)  BUN 8 - 23 mg/dL 28(H) 31(H) 37(H)  Creatinine 0.61 - 1.24 mg/dL 1.55(H) 1.78(H) 1.75(H)  Sodium 135 - 145 mmol/L 129(L) 131(L) 129(L)  Potassium 3.5 - 5.1 mmol/L 3.9 4.2 4.6  Chloride 98 - 111 mmol/L 99 96(L) 93(L)  CO2 22 - 32 mmol/L 21(L) 25 24  Calcium 8.9 - 10.3 mg/dL 7.4(L) 7.9(L) 8.4(L)  Total Protein 6.5 - 8.1 g/dL - - 6.5  Total Bilirubin 0.3 - 1.2 mg/dL - - 1.0  Alkaline Phos 38 - 126 U/L - - 80  AST 15 - 41 U/L - - 19  ALT 0 - 44 U/L - - 17    Estimated Creatinine Clearance: 51.5 mL/min (A) (by C-G formula based on SCr of 1.55 mg/dL (H)).  [name]. Stanford Breed,  MD [facility] Phone Number: [phone] 12/11/2020 10:40 AM

## 2020-12-11 NOTE — Progress Notes (Signed)
ANTICOAGULATION CONSULT NOTE - Follow Up Consult  Pharmacy Consult for Apixaban to Heparin Indication: atrial fibrillation  Allergies  Allergen Reactions  . Codeine Hives and Itching  . Ofev [Nintedanib] Diarrhea    SEVERE DIARRHEA  . Other Diarrhea and Other (See Comments)    Esbriet causes elevated LFTs. D/C on 06/14/17 and SEVERE DIARRHEA    Patient Measurements: Height: 6' (182.9 cm) Weight: 119.2 kg (262 lb 12.6 oz) IBW/kg (Calculated) : 77.6 Heparin Dosing Weight: 103 kg  Vital Signs: Temp: 98.6 F (37 C) (01/07 1148) Temp Source: Oral (01/07 1148) BP: 114/54 (01/07 1148) Pulse Rate: 64 (01/07 1148)  Labs: Recent Labs    12/09/20 0304 12/10/20 0354 12/11/20 0436 12/11/20 1101 12/11/20 2014  HGB 10.7* 9.6* 9.1*  --   --   HCT 32.4* 28.4* 28.1*  --   --   PLT 218 198 174  --   --   APTT  --   --   --  57* 60*  HEPARINUNFRC  --   --   --  1.64*  --   CREATININE 1.75* 1.78* 1.55*  --   --   TROPONINIHS 33*  --   --   --   --     Estimated Creatinine Clearance: 51.5 mL/min (A) (by C-G formula based on SCr of 1.55 mg/dL (H)).  Assessment: 80 year old male with hx of atrial fibrillation to begin heparin after vascular procedure today; last dose of apixaban was on 1/5 PM. Given recent apixaban exposure, will monitor anticoagulation using aPTT until aPTT and heparin levels correlate.  APTT ~7 hrs after heparin infusion was increased to 1450 units/hr was 60 sec, which is below the goal range for this pt.  Hgb with slight drift down; plt 174. Per RN, no issues with bleeding observed; however, RN stated that the pt's heparin bag ran out prior to shift change and has not been restarted yet (aPTT was drawn at 20:14 PM, which is ~1 hr after shift change, so aPTT level is not accurate since heparin IV was off for at least 1 hr prior to aPTT being drawn).  Goal of Therapy:  aPTT 66-102 seconds seconds Monitor platelets by anticoagulation protocol: Yes  Heparin level 0.3-0.7  units/ml   Plan:  Continue heparin at 1450 units/hr Check heparin level 8 hrs after heparin infusion restarted Monitor daily heparin level and CBC Monitor for bleeding F/U plans to resume apixaban when able  Gillermina Hu, PharmD, BCPS, [facility] Clinical Pharmacist  12/11/2020 8:50 PM

## 2020-12-11 NOTE — CV Procedure (Signed)
    TRANSESOPHAGEAL ECHOCARDIOGRAM   NAME:  Shane [name]    MRN: [id number] DOB:  05/14/1941    ADMIT DATE: 12/08/2020  INDICATIONS: Bacteremia  PROCEDURE:   Informed consent was obtained prior to the procedure. The risks, benefits and alternatives for the procedure were discussed and the patient comprehended these risks.  Risks include, but are not limited to, cough, sore throat, vomiting, nausea, somnolence, esophageal and stomach trauma or perforation, bleeding, low blood pressure, aspiration, pneumonia, infection, trauma to the teeth and death.    Procedural time out performed. The oropharynx was anesthetized with topical 1% benzocaine.    Anesthesia was administered by Dr. Benard Rink. 587 mcg propofol and 60 lido given.  The patient's heart rate, blood pressure, and oxygen saturation are monitored continuously during the procedure. The period of conscious sedation is 33 minutes, of which I was present face-to-face 100% of this time.   The transesophageal probe was inserted in the esophagus and stomach without difficulty and multiple views were obtained.   COMPLICATIONS:    There were no immediate complications.  KEY FINDINGS:  1. Small stranding on the aortic valve concerning for lambl's excresence vs vegetation. No significant aortic regurgitation. 2. Mild Aortic stenosis. 3. No infection on the pacemaker leads 4. Moderate to severe TR. 5. Normal LVEF ~60% 6. Mild to moderate MR  7. Full report to follow. 8. Further management per primary team.   Addison Naegeli. Audie Box, [place]  [address] [place], [place] [address] [phone]  9:58 AM

## 2020-12-11 NOTE — Anesthesia Preprocedure Evaluation (Addendum)
Anesthesia Evaluation  Patient identified by MRN, date of birth, ID band Patient awake    Reviewed: Allergy & Precautions, NPO status , Patient's Chart, lab work & pertinent test results, reviewed documented beta blocker date and time   History of Anesthesia Complications (+) PONV and history of anesthetic complications  Airway Mallampati: III  TM Distance: >3 FB Neck ROM: Full    Dental  (+) Poor Dentition, Dental Advisory Given, Chipped,    Pulmonary shortness of breath (uses oxygen PRN after CABG last month), with exertion, lying and Long-Term Oxygen Therapy, sleep apnea and Continuous Positive Airway Pressure Ventilation ,  Idiopathic pulmonary fibrosis diagnosed 2017    Pulmonary exam normal breath sounds clear to auscultation       Cardiovascular hypertension, Pt. on medications and Pt. on home beta blockers + CAD, + CABG (11/2020) and + Peripheral Vascular Disease  + dysrhythmias (2nd degree AVB, new afib/flutter started on eliquis) Atrial Fibrillation + pacemaker + Valvular Problems/Murmurs (mild AS) AS  Rhythm:Irregular Rate:Normal  Echo 03/2020: 1. Left ventricular ejection fraction, by estimation, is 60 to 65%. The  left ventricle has normal function. The left ventricle has no regional  wall motion abnormalities. There is mild concentric left ventricular  hypertrophy. Diastolic function  assessment is limited by second degree AV block, Mobitz type 1, but  annular velocities are in normal range.  2. Right ventricular systolic function is normal. The right ventricular  size is normal. Tricuspid regurgitation signal is inadequate for assessing  PA pressure.  3. Left atrial size was moderately dilated.  4. The mitral valve is normal in structure. Mild mitral valve  regurgitation.  5. The aortic valve is tricuspid. Aortic valve regurgitation is not  visualized. Mild aortic valve stenosis.  6. Aortic dilatation noted.  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   Currently in A flutter, Rate in the 60s   Neuro/Psych negative psych ROS   GI/Hepatic Neg liver ROS, GERD  Medicated and Controlled,  Endo/Other  negative endocrine ROSdiabetes, Type 2, Insulin DependentObesity BMI 36  Renal/GU Renal InsufficiencyRenal diseaseCr 1.55, CKD 3  negative genitourinary   Musculoskeletal negative musculoskeletal ROS (+)   Abdominal (+) + obese,   Peds  Hematology  (+) Blood dyscrasia, anemia , hct 28.1   Anesthesia Other Findings Bacteremia s/p CABG at [facility] 11/04/2020, moderate AS, discharged from [facility] 11/18/2020, slow recovery since, presented to ED due to 1 week or so history of increasing fatigue, chronic dyspnea, recent chills, low-grade fevers, frequent cough.  Chart review of OP notes indicate bouts of atrial tachycardia and A. fib since approximately 12/27 and device clinic from earlier on 1/4. Started on eliquis, found to have RLE ischemia now s/p angio  Reproductive/Obstetrics negative OB ROS                            Anesthesia Physical Anesthesia Plan  ASA: IV  Anesthesia Plan: MAC   Post-op Pain Management:    Induction:   PONV Risk Score and Plan: 2 and Propofol infusion and TIVA  Airway Management Planned: Natural Airway and Simple Face Mask  Additional Equipment: None  Intra-op Plan:   Post-operative Plan:   Informed Consent: I have reviewed the patients History and Physical, chart, labs and discussed the procedure including the risks, benefits and alternatives for the proposed anesthesia with the patient or authorized representative who has indicated his/her understanding and acceptance.  Plan Discussed with: CRNA  Anesthesia Plan Comments:        Anesthesia Quick Evaluation

## 2020-12-11 NOTE — Anesthesia Procedure Notes (Signed)
Procedure Name: MAC Date/Time: 12/11/2020 9:25 AM Performed by: Kathryne Hitch, CRNA Pre-anesthesia Checklist: Patient identified, Emergency Drugs available, Suction available and Patient being monitored Patient Re-evaluated:Patient Re-evaluated prior to induction Oxygen Delivery Method: Nasal cannula Preoxygenation: Pre-oxygenation with 100% oxygen Induction Type: IV induction Dental Injury: Teeth and Oropharynx as per pre-operative assessment

## 2020-12-11 NOTE — Progress Notes (Signed)
ANTICOAGULATION CONSULT NOTE - Follow Up Consult  Pharmacy Consult for Apixaban to Heparin Indication: atrial fibrillation  Allergies  Allergen Reactions  . Codeine Hives and Itching  . Ofev [Nintedanib] Diarrhea    SEVERE DIARRHEA  . Other Diarrhea and Other (See Comments)    Esbriet causes elevated LFTs. D/C on 06/14/17 and SEVERE DIARRHEA    Patient Measurements: Height: 6' (182.9 cm) Weight: 119.2 kg (262 lb 12.6 oz) IBW/kg (Calculated) : 77.6 Heparin Dosing Weight: 103 kg  Vital Signs: Temp: 98.6 F (37 C) (01/07 1148) Temp Source: Oral (01/07 1148) BP: 114/54 (01/07 1148) Pulse Rate: 64 (01/07 1148)  Labs: Recent Labs    12/08/20 1805 12/08/20 2045 12/09/20 0304 12/10/20 0354 12/11/20 0436 12/11/20 1101  HGB 11.9*  --  10.7* 9.6* 9.1*  --   HCT 35.8*  --  32.4* 28.4* 28.1*  --   PLT 257  --  218 198 174  --   APTT  --   --   --   --   --  57*  HEPARINUNFRC  --   --   --   --   --  1.64*  CREATININE 1.58*  --  1.75* 1.78* 1.55*  --   TROPONINIHS 27* 30* 33*  --   --   --     Estimated Creatinine Clearance: 51.5 mL/min (A) (by C-G formula based on SCr of 1.55 mg/dL (H)).  Assessment: 80 year old male to begin heparin after vascular procedure today  Last dose of apixaban 1/5 PM.  Heparin restarted yesterday while oral Eliquis on hold.  Heparin level falsely elevated from recent Eliquis.  APTT slightly below goal.  No overt bleeding or complications.  Hgb with slight drift down.  Goal of Therapy:  aPTT 66-102 seconds seconds Monitor platelets by anticoagulation protocol: Yes  Heparin level 0.3-0.7   Plan:  Increase IV heparin to 1450 units/hr. Repeat heparin level in 8 hrs Daily heparin level and CBC. F/u plans to resume Eliquis as able.  [place] Crane, Roylene Reason, BCCP Clinical Pharmacist  12/11/2020 12:54 PM   [facility] pharmacy phone numbers are listed on [email or web address]

## 2020-12-11 NOTE — Progress Notes (Signed)
Physical Therapy Treatment Patient Details Name: Shane [name] MRN: [id number] DOB: [birth year] Today's Date: 12/11/2020    History of Present Illness Pt is a 80 y/o male admitted secondary to generalized weakness and chills. Found to also have a flutter. Pt with 5th metatarsal wound. Underwent aterial stents of RLE on 12/10/20.  Pt is s/p CABG 4 weeks ago at [facility]. PMH includes OSA on CPAP, a fib, HTN, CKD, CAD s/p pacemaker, and pulmonary fibrosis.    PT Comments    Pt making steady progress. Able to increase ambulation distance. Supportive wife.   Follow Up Recommendations  Home health PT;Supervision for mobility/OOB     Equipment Recommendations  None recommended by PT    Recommendations for Other Services       Precautions / Restrictions Precautions Precautions: Fall;Other (comment) Precaution Comments: Maintained sternal precautions    Mobility  Bed Mobility Overal bed mobility: Needs Assistance Bed Mobility: Supine to Sit;Sit to Supine     Supine to sit: Min assist;HOB elevated Sit to supine: Supervision   General bed mobility comments: Assist to elevate trunk into sitting  Transfers Overall transfer level: Needs assistance Equipment used: 4-wheeled walker Transfers: Sit to/from Stand Sit to Stand: Min guard         General transfer comment: Assist for safety  Ambulation/Gait Ambulation/Gait assistance: Min guard Gait Distance (Feet): 100 Feet Assistive device: 4-wheeled walker Gait Pattern/deviations: Step-through pattern;Decreased stride length;Decreased dorsiflexion - right Gait velocity: Decreased but at times speeds up too much Gait velocity interpretation: 1.31 - 2.62 ft/sec, indicative of limited community ambulator General Gait Details: Assist for safety/lines   Stairs             Wheelchair Mobility    Modified Rankin (Stroke Patients Only)       Balance Overall balance assessment: Needs assistance Sitting-balance support: No  upper extremity supported;Feet supported Sitting balance-Leahy Scale: Good     Standing balance support: Bilateral upper extremity supported Standing balance-Leahy Scale: Poor Standing balance comment: rollator and min guard for static standing                            Cognition Arousal/Alertness: Awake/alert Behavior During Therapy: WFL for tasks assessed/performed Overall Cognitive Status: Within Functional Limits for tasks assessed                                        Exercises      General Comments General comments (skin integrity, edema, etc.): On 4L O2      Pertinent Vitals/Pain Pain Assessment: Faces Faces Pain Scale: Hurts a little bit Pain Location: grimace with coughing Pain Descriptors / Indicators: Grimacing;Guarding Pain Intervention(s): Limited activity within patient's tolerance    Home Living                      Prior Function            PT Goals (current goals can now be found in the care plan section) Acute Rehab PT Goals Patient Stated Goal: to feel better Progress towards PT goals: Progressing toward goals    Frequency    Min 3X/week      PT Plan Current plan remains appropriate    Co-evaluation              AM-PAC PT "6 Clicks" Mobility   Outcome  Measure  Help needed turning from your back to your side while in a flat bed without using bedrails?: A Little Help needed moving from lying on your back to sitting on the side of a flat bed without using bedrails?: A Little Help needed moving to and from a bed to a chair (including a wheelchair)?: A Little Help needed standing up from a chair using your arms (e.g., wheelchair or bedside chair)?: A Little Help needed to walk in hospital room?: A Little Help needed climbing 3-5 steps with a railing? : A Little 6 Click Score: 18    End of Session Equipment Utilized During Treatment: Gait belt Activity Tolerance: Patient tolerated treatment  well Patient left: in bed;with call bell/phone within reach;with bed alarm set Nurse Communication: Mobility status PT Visit Diagnosis: Unsteadiness on feet (R26.81);Muscle weakness (generalized) (M62.81)     Time: 8867-7373 PT Time Calculation (min) (ACUTE ONLY): 32 min  Charges:  $Gait Training: 23-37 mins                     [place] Pager [phone] Office [place] 12/11/2020, 4:16 PM

## 2020-12-11 NOTE — Telephone Encounter (Addendum)
Research note   Title: [id number] (F[employer] Study) is a Phase 3, randomized, double-blind, placebo-controlled multicenter international study to evaluate evaluate the efficacy and safety of 30 mg/kg IV infusions of pamrevlumab administered every 3 weeks for 52 weeks as compared to placebo in subjects with Idiopathic Pulmonary Fibrosis. Primary end point is: change in FVC from baseline at week 52. ZEPHYRUS STUDY  Protocol #: FGCL-[id number], Clinical Trials #: [id number] Sponsor: www.[employer]  ([place], [place], [place])  xxxxxxxxxxxxxxxxx   PI note - patient was supposed to get PAMREVLUMAB against IPF Q3 weeks open label 12/11/2020 buit he is now admitted.. I only got to know of this admission yesterday 12/10/20 afternoon when wife called [facility]  Plan  - hold off on study drug  - reviewed events - as PI in my view this is NOT related to study drug - will submit data to study sponsor 12/11/2020 capturing this admission as a "[place]":    SIGNATURE    Dr. Brand Males, M.D., F.C.C.P, ACRP-CPI Pulmonary and [place], [facility] @ [place] Staff Physician, [place] Director - Interstitial Lung Disease  Program  Pulmonary [employer] [facility] and [facility] @ [place], [place], [address]   Pager: [phone], If no answer  OR between  19:00-7:00h: page [phone] Telephone (research): [phone]  1:08 PM 12/11/2020   1:08 PM 12/11/2020

## 2020-12-11 NOTE — Progress Notes (Signed)
  Echocardiogram Echocardiogram Transesophageal has been performed.  Shane [name] 12/11/2020, 1:21 PM

## 2020-12-12 ENCOUNTER — Encounter (HOSPITAL_COMMUNITY): Payer: Self-pay | Admitting: Cardiovascular Disease

## 2020-12-12 DIAGNOSIS — B9561 Methicillin susceptible Staphylococcus aureus infection as the cause of diseases classified elsewhere: Secondary | ICD-10-CM | POA: Diagnosis not present

## 2020-12-12 DIAGNOSIS — I482 Chronic atrial fibrillation, unspecified: Secondary | ICD-10-CM | POA: Diagnosis not present

## 2020-12-12 DIAGNOSIS — R7881 Bacteremia: Secondary | ICD-10-CM | POA: Diagnosis not present

## 2020-12-12 DIAGNOSIS — Z959 Presence of cardiac and vascular implant and graft, unspecified: Secondary | ICD-10-CM | POA: Diagnosis not present

## 2020-12-12 LAB — BASIC METABOLIC PANEL
Anion gap: 9 (ref 5–15)
BUN: 31 mg/dL — ABNORMAL HIGH (ref 8–23)
CO2: 23 mmol/L (ref 22–32)
Calcium: 7.8 mg/dL — ABNORMAL LOW (ref 8.9–10.3)
Chloride: 102 mmol/L (ref 98–111)
Creatinine, Ser: 1.52 mg/dL — ABNORMAL HIGH (ref 0.61–1.24)
GFR, Estimated: 46 mL/min — ABNORMAL LOW (ref >60)
Glucose, Bld: 162 mg/dL — ABNORMAL HIGH (ref 70–99)
Potassium: 4.2 mmol/L (ref 3.5–5.1)
Sodium: 134 mmol/L — ABNORMAL LOW (ref 135–145)

## 2020-12-12 LAB — HEPARIN LEVEL (UNFRACTIONATED): Heparin Unfractionated: 0.96 IU/mL — ABNORMAL HIGH (ref 0.30–0.70)

## 2020-12-12 LAB — CBC
HCT: 27.5 % — ABNORMAL LOW (ref 39.0–52.0)
Hemoglobin: 9.3 g/dL — ABNORMAL LOW (ref 13.0–17.0)
MCH: 31.7 pg (ref 26.0–34.0)
MCHC: 33.8 g/dL (ref 30.0–36.0)
MCV: 93.9 fL (ref 80.0–100.0)
Platelets: 168 10*3/uL (ref 150–400)
RBC: 2.93 MIL/uL — ABNORMAL LOW (ref 4.22–5.81)
RDW: 13.2 % (ref 11.5–15.5)
WBC: 9.1 10*3/uL (ref 4.0–10.5)
nRBC: 0 % (ref 0.0–0.2)

## 2020-12-12 LAB — CULTURE, BLOOD (ROUTINE X 2)
Special Requests: ADEQUATE
Special Requests: ADEQUATE

## 2020-12-12 LAB — APTT
aPTT: 72 seconds — ABNORMAL HIGH (ref 24–36)
aPTT: 78 seconds — ABNORMAL HIGH (ref 24–36)

## 2020-12-12 LAB — GLUCOSE, CAPILLARY
Glucose-Capillary: 144 mg/dL — ABNORMAL HIGH (ref 70–99)
Glucose-Capillary: 202 mg/dL — ABNORMAL HIGH (ref 70–99)
Glucose-Capillary: 128 mg/dL — ABNORMAL HIGH (ref 70–99)
Glucose-Capillary: 169 mg/dL — ABNORMAL HIGH (ref 70–99)

## 2020-12-12 MED ORDER — FUROSEMIDE 40 MG PO TABS
40.0000 mg | ORAL_TABLET | Freq: Every day | ORAL | Status: DC
Start: 2020-12-13 — End: 2020-12-16
  Administered 2020-12-13 – 2020-12-16 (×4): 40 mg via ORAL
  Filled 2020-12-12 (×4): qty 1

## 2020-12-12 MED ORDER — ALBUTEROL SULFATE (2.5 MG/3ML) 0.083% IN NEBU
2.5000 mg | INHALATION_SOLUTION | RESPIRATORY_TRACT | Status: DC | PRN
  Administered 2020-12-12 – 2020-12-16 (×8): 2.5 mg via RESPIRATORY_TRACT
  Filled 2020-12-12 (×8): qty 3

## 2020-12-12 MED ORDER — ALBUTEROL SULFATE (2.5 MG/3ML) 0.083% IN NEBU
INHALATION_SOLUTION | RESPIRATORY_TRACT | Status: AC
  Filled 2020-12-12: qty 3

## 2020-12-12 MED ORDER — FUROSEMIDE 10 MG/ML IJ SOLN
40.0000 mg | Freq: Once | INTRAMUSCULAR | Status: AC
  Administered 2020-12-12: 40 mg via INTRAVENOUS
  Filled 2020-12-12: qty 4

## 2020-12-12 NOTE — Progress Notes (Signed)
ANTICOAGULATION CONSULT NOTE - Follow Up Consult  Pharmacy Consult for Apixaban to Heparin Indication: atrial fibrillation  Allergies  Allergen Reactions  . Codeine Hives and Itching  . Ofev [Nintedanib] Diarrhea    SEVERE DIARRHEA  . Other Diarrhea and Other (See Comments)    Esbriet causes elevated LFTs. D/C on 06/14/17 and SEVERE DIARRHEA    Patient Measurements: Height: 6' (182.9 cm) Weight: 122.1 kg (269 lb 2.9 oz) IBW/kg (Calculated) : 77.6 Heparin Dosing Weight: 103 kg  Vital Signs: Temp: 99 F (37.2 C) (01/08 0550) Temp Source: Oral (01/08 0550) BP: 107/92 (01/08 0841) Pulse Rate: 74 (01/08 0841)  Labs: Recent Labs    12/10/20 0354 12/11/20 0436 12/11/20 1101 12/11/20 1101 12/11/20 2014 12/12/20 0203 12/12/20 1030  HGB 9.6* 9.1*  --   --   --  9.3*  --   HCT 28.4* 28.1*  --   --   --  27.5*  --   PLT 198 174  --   --   --  168  --   APTT  --   --  57*   < > 60* 72* 78*  HEPARINUNFRC  --   --  1.64*  --   --  0.96*  --   CREATININE 1.78* 1.55*  --   --   --  1.52*  --    < > = values in this interval not displayed.    Estimated Creatinine Clearance: 53.2 mL/min (A) (by C-G formula based on SCr of 1.52 mg/dL (H)).  Assessment: 80 year old male with hx of atrial fibrillation to begin heparin after vascular procedure today; last dose of apixaban was on 1/5 PM. Given recent apixaban exposure, will monitor anticoagulation using aPTT until aPTT and heparin levels correlate.  Confirmatory aPTT remains therapeutic at 78 seconds on gtt at 1450 units/hr. CBC stable. No bleeding noted.  Goal of Therapy:  aPTT 66-102 seconds seconds Monitor platelets by anticoagulation protocol: Yes  Heparin level 0.3-0.7 units/ml   Plan:  Continue heparin at 1450 units/hr Daily aPTT, heparin level, CBC   Rebbeca Paul, PharmD PGY1 Pharmacy Resident 12/12/2020 11:51 AM  Please check [email or web address] for unit-specific pharmacy phone numbers.

## 2020-12-12 NOTE — Consult Note (Signed)
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   He has been found to have MSSA bacteremia and new onset atrial fibrillation/flutter. He is being treated with cefazolin IV per infectious disease. He has had increasing productive cough and has been placed on supplemental oxygen which has not required previously.  Patient reports his cough started 3 days ago and got concerning last night when he was having a difficult time coughing while on his CPAP. He is coughing up whitish yellow mucous. He denies wheezing or chest tightness. He has been started on albuterol nebulizer treatments with improvement in his cough and bringing up the mucous. He has rhinitis but denies post-nasal drainage. He reports increasing cough with eating and drinking which has been an issue in the past as he has had neck surgeries in the past.   He reports he has been walking in the hallway without desaturations below 88%. It is noted in an OT note on 12/11/19 that he maintained an oxygen saturation of 90-95% with activity.  CT Chest 12/08/2020 reviewed, no progression noted of interstitial disease compared to HRCT in 2020. No other new opacities or infiltrates.  TEE 12/11/20 reviewed, he has normal EF with moderate reduction in RV function and mild dilation in RV size.         Past Medical History:  He,  has a past medical history of AV block, Mobitz 1, Cataract, Chronic kidney disease, CKD (chronic kidney disease), stage III ([place]), Diabetes mellitus, Dyspnea, GERD (gastroesophageal reflux disease),  Hyperlipidemia, Hypertension, Idiopathic pulmonary fibrosis ([place]) (11/2016), ILD (interstitial lung disease) ([address]), Moderate aortic stenosis, Neuromuscular disorder ([place]), Nonobstructive CAD (coronary artery disease), OSA on CPAP (05/05/2017), PONV (postoperative nausea and vomiting), and Sleep apnea.   Significant Hospital Events:    Consults:  Cardiology Infectious Disease  Procedures:  1/7 TEE  Micro Data:  12/08/20 Blood Cx MSSA 12/11/20 Blood Cx negative  Antimicrobials:  Cefazolin 1/5>>  Interim History / Subjective:    Objective   Blood pressure (!) 107/92, pulse 74, temperature 99 F (37.2 C), temperature source Oral, resp. rate 20, height 6' (1.829 m), weight 122.1 kg, SpO2 100 %.    FiO2 (%):  [28 %] 28 %   Intake/Output Summary (Last 24 hours) at 12/12/2020 1443 Last data filed at 12/11/2020 1800 Gross per 24 hour  Intake 240.97 ml  Output --  Net 240.97 ml   Filed Weights   12/11/20 0520 12/11/20 0909 12/12/20 0550  Weight: 119.2 kg 119.2 kg 122.1 kg    Examination: General: elderly male, no acute distress, resting in bed HENT: [place]/AT, sclera anicteric, moist mucous membranes Lungs: scattered rales posteriorly. No wheezing or rhonchi. 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   1. Idiopathic Pulmonary Fibrosis Upon review of chest imaging he has no progression of disease based on radiologic evidence. He also does not have oxygen saturations  below 88% at rest or even with activity. The medication he has been taking via clinical trial is on hold currently.  - Continue pulmonary hygiene with albuterol nebulizer treatments, flutter valve and incentive spirometry - Encourage out of bed to chair and multiple walks daily  - No need for supplemental oxygen unless he has oxygen saturation below 88%  2. Cough/dysphagia - Continue pulmonary hygiene as above - Recommend speech therapy evaluation as he describes increased cough with eating and drinking with concern for possible aspiration. May require MBS if speech therapy feels necessary.  3. Obstructive Sleep Apnea  - Continue CPAP therapy  Labs   CBC: Recent Labs  Lab 12/08/20 1805 12/09/20 0304 12/10/20 0354 12/11/20 0436 12/12/20 0203  WBC 10.2 8.2 11.9* 10.4 9.1  NEUTROABS 7.3 5.4  --   --   --   HGB 11.9* 10.7* 9.6* 9.1* 9.3*  HCT 35.8* 32.4* 28.4* 28.1* 27.5*  MCV 95.0 94.5 93.4 94.6 93.9  PLT 257 218 198 174 742    Basic Metabolic Panel: Recent Labs  Lab 12/08/20 1805 12/08/20 2045 12/09/20 0304 12/10/20 0354 12/11/20 0436 12/12/20 0203  NA 131*  --  129* 131* 129* 134*  K 4.5  --  4.6 4.2 3.9 4.2  CL 92*  --  93* 96* 99 102  CO2 26  --  24 25 21* 23  GLUCOSE 143*  --  153* 70 114* 162*  BUN 37*  --  37* 31* 28* 31*  CREATININE 1.58*  --  1.75* 1.78* 1.55* 1.52*  CALCIUM 9.3  --  8.4* 7.9* 7.4* 7.8*  MG  --  1.7 1.9  --   --   --    GFR: Estimated Creatinine Clearance: 53.2 mL/min (A) (by C-G formula based on SCr of 1.52 mg/dL (H)). Recent Labs  Lab 12/08/20 1805 12/08/20 2045 12/09/20 0304 12/10/20 0354 12/11/20 0436 12/12/20 0203  WBC 10.2  --  8.2 11.9* 10.4 9.1  LATICACIDVEN 2.1* 1.5  --   --   --   --     Liver Function Tests: Recent Labs  Lab 12/08/20 1805 12/09/20 0304  AST 20 19  ALT 19 17  ALKPHOS 90 80  BILITOT 1.2 1.0  PROT 7.3 6.5  ALBUMIN 3.0* 2.5*   No results for input(s): LIPASE, AMYLASE in the last 168 hours. No  results for input(s): AMMONIA in the last 168 hours.  ABG    Component Value Date/Time   PHART 7.362 12/27/2016 0334   PCO2ART 45.4 12/27/2016 0334   PO2ART 115 (H) 12/27/2016 0334   HCO3 25.1 12/27/2016 0334   TCO2 21 (L) 04/27/2018 1758   ACIDBASEDEF TEST WILL BE CREDITED 12/19/2016 1333   O2SAT 95.7 12/27/2016 0334     Coagulation Profile: No results for input(s): INR, PROTIME in the last 168 hours.  Cardiac Enzymes: No results for input(s): CKTOTAL, CKMB, CKMBINDEX, TROPONINI in the last 168 hours.  HbA1C: Hgb A1c MFr Bld  Date/Time Value Ref Range Status  12/09/2020 03:04 AM 6.6 (H) 4.8 - 5.6 % Final    Comment:    (NOTE) Pre diabetes:          5.7%-6.4%  Diabetes:              >  6.4%  Glycemic control for   <7.0% adults with diabetes   03/28/2020 02:48 PM 7.6 (H) 4.8 - 5.6 % Final    Comment:    (NOTE) Pre diabetes:          5.7%-6.4% Diabetes:              >6.4% Glycemic control for   <7.0% adults with diabetes     CBG: Recent Labs  Lab 12/11/20 1145 12/11/20 1612 12/11/20 2156 12/12/20 0748 12/12/20 1246  GLUCAP 89 105* 124* 144* 202*    Review of Systems:   A 10 point review of systems was performed and is otherwise negative unless stated in the HPI.  Past Medical History:  He,  has a past medical history of AV block, Mobitz 1, Cataract, Chronic kidney disease, CKD (chronic kidney disease), stage III ([place]), Diabetes mellitus, Dyspnea, GERD (gastroesophageal reflux disease), Hyperlipidemia, Hypertension, Idiopathic pulmonary fibrosis ([place]) (11/2016), ILD (interstitial lung disease) ([place]), Moderate aortic stenosis, Neuromuscular disorder ([place]), Nonobstructive CAD (coronary artery disease), OSA on CPAP (05/05/2017), PONV (postoperative nausea and vomiting), and Sleep apnea.   Surgical History:   Past Surgical History:  Procedure Laterality Date  . ABDOMINAL AORTOGRAM W/LOWER EXTREMITY N/A 12/10/2020   Procedure: ABDOMINAL AORTOGRAM W/LOWER EXTREMITY;   Surgeon: Cherre Robins, MD;  Location: [place] [facility];  Service: Cardiovascular;  Laterality: N/A;  . ANTERIOR FUSION CERVICAL SPINE  2012  . CARDIAC CATHETERIZATION  2011  . CARDIAC CATHETERIZATION N/A 11/09/2016   Procedure: Right Heart Cath;  Surgeon: Belva Crome, MD;  Location: [place] [facility];  Service: Cardiovascular;  Laterality: N/A;  . carpel tunnel     left wrist  . CATARACT EXTRACTION    . CATARACT EXTRACTION W/ INTRAOCULAR LENS  IMPLANT, BILATERAL  2013  . CERVICAL LAMINECTOMY  2012  . COLONOSCOPY N/A 01/14/2013   Procedure: COLONOSCOPY;  Surgeon: Irene Shipper, MD;  Location: [facility];  Service: Endoscopy;  Laterality: N/A;  . CORONARY ARTERY BYPASS GRAFT  11/04/2020   LIMA-LAD, SVG-OM1, SVG-PDA (Dr Mar[facility]) dc 11/18/2020  . EYE SURGERY    . [place]   left  . LEFT HEART CATH AND CORONARY ANGIOGRAPHY N/A 07/10/2020   Procedure: LEFT HEART CATH AND CORONARY ANGIOGRAPHY;  Surgeon: Sherren Mocha, MD;  Location: [place] [facility];  Service: Cardiovascular;  Laterality: N/A;  . LUMBAR LAMINECTOMY  2003  . LUNG BIOPSY Left 12/26/2016   Procedure: LUNG BIOPSY;  Surgeon: [name], MD;  Location: [place];  Service: Thoracic;  Laterality: Left;  . PACEMAKER IMPLANT N/A 03/30/2020   Procedure: PACEMAKER IMPLANT;  Surgeon: Evans Lance, MD;  Location: [place] [facility];  Service: Cardiovascular;  Laterality: N/A;  . PERIPHERAL VASCULAR INTERVENTION Right 12/10/2020   Procedure: PERIPHERAL VASCULAR INTERVENTION;  Surgeon: Cherre Robins, MD;  Location: [place] [facility];  Service: Cardiovascular;  Laterality: Right;  SFA  . POSTERIOR FUSION CERVICAL SPINE  2012  . TRIGGER FINGER RELEASE  2011   4th finger left hand  . VIDEO ASSISTED THORACOSCOPY Left 12/26/2016   Procedure: VIDEO ASSISTED THORACOSCOPY;  Surgeon: [name], MD;  Location: [place];  Service: Thoracic;  Laterality: Left;  Mar[place] Kitchen VIDEO BRONCHOSCOPY N/A 12/26/2016   Procedure:  VIDEO BRONCHOSCOPY;  Surgeon: [name], MD;  Location: [place];  Service: Thoracic;  Laterality: N/A;     Social History:   reports that he has never smoked. He has never used smokeless  tobacco. He reports that he does not drink alcohol and does not use drugs.   Family History:  His family history includes Diabetes Mellitus II in his mother; Emphysema (age of onset: 62) in his father; Heart attack in his father. There is no history of Colon cancer, Esophageal cancer, Rectal cancer, or Stomach cancer.   Allergies Allergies  Allergen Reactions  . Codeine Hives and Itching  . Ofev [Nintedanib] Diarrhea    SEVERE DIARRHEA  . Other Diarrhea and Other (See Comments)    Esbriet causes elevated LFTs. D/C on 06/14/17 and SEVERE DIARRHEA     Home Medications  Prior to Admission medications   Medication Sig Start Date End Date Taking? Authorizing Provider  albuterol (VENTOLIN HFA) 108 (90 Base) MCG/ACT inhaler Inhale 2 puffs into the lungs every 4 (four) hours as needed for wheezing or shortness of breath.   Yes [provider]  Ascorbic Acid (VITAMIN C WITH ROSE HIPS) 500 MG tablet Take 500 mg by mouth daily.   Yes [provider]  aspirin EC 81 MG tablet Take 81 mg by mouth daily.   Yes [provider]  atorvastatin (LIPITOR) 40 MG tablet Take 40 mg by mouth at bedtime. 11/16/20  Yes [provider]  clotrimazole-betamethasone (LOTRISONE) cream Apply 1 application topically daily as needed (for skin infection).  01/16/17  Yes [provider]  [employer] ACETAMINOPHEN 325 MG tablet Take 650 mg by mouth daily as needed for pain. 11/15/20  Yes [provider]  EPINEPHrine 0.3 mg/0.3 mL IJ SOAJ injection Inject 0.3 mg into the muscle once as needed for anaphylaxis (max 3 doses). 01/30/18  Yes [provider]  furosemide (LASIX) 40 MG tablet Take 40 mg by mouth daily. 02/15/18  Yes [provider]  insulin lispro (HUMALOG) 100  UNIT/ML injection Inject 22 Units into the skin 3 (three) times daily with meals.   Yes [provider]  liver oil-zinc oxide (DESITIN) 40 % ointment Apply 1 application topically in the morning and at bedtime.   Yes [provider]  LORazepam (ATIVAN) 1 MG tablet Take 1 mg by mouth at bedtime as needed for sleep.  03/20/20  Yes [provider]  losartan (COZAAR) 50 MG tablet Take 50 mg by mouth daily.   Yes [provider]  magnesium oxide (MAG-OX) 400 MG tablet TAKE 1 CAPSULE BY MOUTH ONCE DAILY Patient taking differently: Take 400 mg by mouth daily. 08/11/20  Yes Brand Males, MD  metoprolol tartrate (LOPRESSOR) 50 MG tablet Take 25 mg by mouth 2 (two) times daily. 11/16/20  Yes [provider]  Multiple Vitamin (MULTIVITAMIN WITH MINERALS) TABS tablet Take 1 tablet by mouth daily.   Yes [provider]  pantoprazole (PROTONIX) 40 MG tablet Take 40 mg by mouth daily.   Yes [provider]  polyethylene glycol powder (GLYCOLAX/MIRALAX) 17 GM/SCOOP powder Take 17 g by mouth daily. 11/15/20  Yes [provider]  potassium chloride SA (KLOR-CON) 20 MEQ tablet Take 20-40 mEq by mouth See admin instructions. Takes 2 tablets (40 mEq totally) by mouth for 7 days; then takes 1 tablet (20 mEq totally) by mouth daily after that   Yes [provider]  TRESIBA FLEXTOUCH 200 UNIT/ML FlexTouch Pen Inject 34 Units into the skin at bedtime. 11/23/20  Yes [provider]  apixaban (ELIQUIS) 5 MG TABS tablet Take 1 tablet (5 mg total) by mouth 2 (two) times daily. 12/08/20   Evans Lance, MD  Continuous Blood  Gluc Sensor (FREESTYLE LIBRE 14 DAY SENSOR) MISC 1 patch every 14 (fourteen) days.    [provider]     Freda Jackson, MD [facility] Office: [phone]   See Amion for Pager Details

## 2020-12-12 NOTE — Progress Notes (Signed)
Pt changed his mind and decided to go on CPAP for the night.

## 2020-12-12 NOTE — Progress Notes (Signed)
Brief infectious disease note.  Patient afebrile over the last 24 hours.  Repeat blood cultures from 1/7 no growth to date.  EP planning for PPM removal on Monday.  Plan: --Continue current antibiotics with cefazolin 2 g every 8 hours --Monitor cultures and will continue to follow   Shane [name] for Infectious Disease [facility] 12/12/2020, 8:38 AM

## 2020-12-12 NOTE — Progress Notes (Signed)
Electrophysiology Rounding Note  Patient Name: Shane [name] Date of Encounter: 12/12/2020  Primary Cardiologist: Kirk Ruths, MD Electrophysiologist: Cristopher Peru, MD   Subjective   NAEO. Planning on PPM system removal Monday.  Inpatient Medications    Scheduled Meds: . albuterol      . aspirin EC  81 mg Oral Daily  . atorvastatin  40 mg Oral Daily  . clopidogrel  75 mg Oral Q breakfast  . insulin aspart  0-9 Units Subcutaneous TID WC  . insulin aspart  7 Units Subcutaneous TID WC  . insulin detemir  15 Units Subcutaneous QHS  . metoprolol tartrate  25 mg Oral BID  . multivitamin with minerals  1 tablet Oral Daily  . pantoprazole  40 mg Oral Daily  . Ensure Max Protein  11 oz Oral BID  . sodium chloride flush  3 mL Intravenous Q12H   Continuous Infusions: . sodium chloride    .  ceFAZolin (ANCEF) IV 2 g (12/11/20 2212)  . heparin 1,450 Units/hr (12/11/20 2146)   PRN Meds: sodium chloride, acetaminophen, albuterol, hydrALAZINE, ketotifen, labetalol, LORazepam, ondansetron (ZOFRAN) IV, polyethylene glycol, prochlorperazine, sodium chloride flush   Vital Signs    Vitals:   12/11/20 1148 12/11/20 2204 12/11/20 2348 12/12/20 0550  BP: (!) 114/54 (!) 113/50 (!) 119/58 (!) 116/53  Pulse: 64 75 88 71  Resp: (!) 21  (!) 22 20  Temp: 98.6 F (37 C)  98.6 F (37 C) 99 F (37.2 C)  TempSrc: Oral  Oral Oral  SpO2: 100%  100% 100%  Weight:    122.1 kg  Height:    6' (1.829 m)    Intake/Output Summary (Last 24 hours) at 12/12/2020 0817 Last data filed at 12/11/2020 1800 Gross per 24 hour  Intake 940.97 ml  Output --  Net 940.97 ml   Filed Weights   12/11/20 0520 12/11/20 0909 12/12/20 0550  Weight: 119.2 kg 119.2 kg 122.1 kg    Physical Exam    GEN- The patient is well appearing, alert and oriented x 3 today.   Head- normocephalic, atraumatic Eyes-  Sclera clear, conjunctiva pink Ears- hearing intact Oropharynx- clear Neck- supple Lungs- Clear to  ausculation bilaterally, normal work of breathing Heart- Regular rate and rhythm, no murmurs, rubs or gallops GI- soft, NT, ND, + BS Extremities- no clubbing or cyanosis. No edema Skin- no rash or lesion Psych- euthymic mood, full affect Neuro- strength and sensation are intact  Labs    CBC Recent Labs    12/11/20 0436 12/12/20 0203  WBC 10.4 9.1  HGB 9.1* 9.3*  HCT 28.1* 27.5*  MCV 94.6 93.9  PLT 174 263   Basic Metabolic Panel Recent Labs    12/11/20 0436 12/12/20 0203  NA 129* 134*  K 3.9 4.2  CL 99 102  CO2 21* 23  GLUCOSE 114* 162*  BUN 28* 31*  CREATININE 1.55* 1.52*  CALCIUM 7.4* 7.8*   Liver Function Tests No results for input(s): AST, ALT, ALKPHOS, BILITOT, PROT, ALBUMIN in the last 72 hours. No results for input(s): LIPASE, AMYLASE in the last 72 hours. Cardiac Enzymes No results for input(s): CKTOTAL, CKMB, CKMBINDEX, TROPONINI in the last 72 hours.   Telemetry    AF/AFL with controlled rates in 60-70s (personally reviewed)  Radiology    PERIPHERAL VASCULAR CATHETERIZATION  Result Date: 12/10/2020 DATE OF SERVICE: 12/10/2020 PATIENT:  Shane [name]  80 y.o. male PRE-OPERATIVE DIAGNOSIS:  pvd POST-OPERATIVE DIAGNOSIS:  * No post-op diagnosis entered *  PROCEDURE:  1) US guided LCFA access 2) Aortogram 3) RLE angiogram (32mL iodinated contrast) 4) R superficial femoral / above-knee popliteal stenting (6x140mm, 6x1109mm, 6x82mm x 2 Eluvia - total treatment length 36mm) SURGEON:  Surgeon(s) and Role:    * Hawken, [name], MD - Primary ASSISTANT: none ANESTHESIA:   local EBL: min BLOOD ADMINISTERED:none DRAINS: none LOCAL MEDICATIONS USED:  LIDOCAINE SPECIMEN:  none COUNTS: confirmed correct. TOURNIQUET:  * No tourniquets in log * PATIENT DISPOSITION:  PACU - hemodynamically stable.  Delay start of Pharmacological VTE agent (>24hrs) due to surgical blood loss or risk of bleeding: no INDICATION FOR PROCEDURE: Shane [name] is a 80 y.o. 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 After careful discussion of risks, benefits, and alternatives the patient was offered angiography. We specifically discussed access site complications and contrast nephropathy. The patient understood and wished to proceed. OPERATIVE FINDINGS: Aorta: no flow limiting disease Right iliac arteries: no flow limiting disease Left iliac arteries: no flow limiting disease Right femoral arteries: SFA occluded just distal to origin. Reconstitution in distal SFA. Right popliteal arteries: Above knee popliteal artery with focal >70% stenosis. Right tibial trifurcation: Peroneal dominant flow to the foot. PT occludes in the calf. Right tibial vessels: Peroneal fills the foot briskly. Good pedal flow. DESCRIPTION OF PROCEDURE: After identification of the patient in the pre-operative holding area, the patient was transferred to the operating room. The patient was positioned supine on the operating room table. Anesthesia was induced. The groins was prepped and draped in standard fashion. A surgical pause was performed confirming correct patient, procedure, and operative location. The left groin was anesthetized with subcutaneous injection of 1% lidocaine. Using ultrasound guidance, the left common femoral artery was accessed with micropuncture technique. Fluoroscopy was used to confirm cannulation over the femoral head. Sheathogram was not performed. The 8F sheath was upsized to 39F. An 035 glidewire advantage was advanced into the distal aorta. Over the wire an omni flush catheter was advanced to the level of L2. Aortogram was performed - see above for details. The right common iliac artery was selected with the 035 glidewire advantage. The wire was advanced into the superficial femoral artery. Over the wire the omni flush catheter was advanced into the external iliac artery. Selective angiography was performed -  see above for details. The decision was made to intervene. The patient was heparinized with 10000 units of heparin. The sheath was exchanged for a 3F x 45cm sheath. Selective angiography of the left lower extremity was performed prior to intervention. The SFA and popliteal lesions were treated with stenting (6x137mm, 6x159mm, 6x53mm x 2 Eluvia). The stents were post dilated with a 5x245mm Mustang balloon. Completion angiography revealed resolution of the stenosis / occlusion. A perclose device was used to close the arteriotomy. Hemostasis was excellent upon completion. Upon completion of the case instrument and sharps counts were confirmed correct. The patient was transferred to the PACU in good condition. I was present for all portions of the procedure. [name]. Stanford Breed, MD [facility] Phone Number: [phone] 12/10/2020 6:36 PM   ECHO TEE  Result Date: 12/11/2020    TRANSESOPHOGEAL ECHO REPORT   Patient Name:   Shane [name] Date of Exam: 12/11/2020 Medical Rec #:  [id number]        Height:       72.0 in Accession #:    [id number]  Weight:       262.8 lb Date of Birth:  26-Jun-1941        BSA:          2.393 m Patient Age:    55 years         BP:           114/54 mmHg Patient Gender: M                HR:           83 bpm. Exam Location:  Inpatient Procedure: 2D Echo, 3D Echo, Transesophageal Echo and Color Doppler Indications:     Bacteremia  History:         Patient has prior history of Echocardiogram examinations, most                  recent 03/29/2020. Risk Factors:Sleep Apnea.  Sonographer:     Clayton Lefort RDCS (AE) Referring Phys:  [id number] [place] Diagnosing Phys: Eleonore Chiquito MD PROCEDURE: After discussion of the risks and benefits of a TEE, an informed consent was obtained from the patient. TEE procedure time was 33 minutes. The transesophogeal probe was passed without difficulty through the esophogus of the patient. Imaged were obtained with the  patient in a left lateral decubitus position. Local oropharyngeal anesthetic was provided with Cetacaine. Sedation performed by different physician. The patient was monitored while under deep sedation. Anesthestetic sedation was provided intravenously by Anesthesiology: 587mg  of Propofol, 60mg  of Lidocaine. Image quality was excellent. The patient's vital signs; including heart rate, blood pressure, and oxygen saturation; remained stable throughout the procedure. The patient developed no complications during the procedure. IMPRESSIONS  1. There is small fibrous stranding attached to the leaflet tips of the aortic valve that could be related to valve degeneration. 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 In the setting of bacteremia, cannot exclude endocarditis. The stranding is small (0.5 cm x 0.2 cm) and there is no significant regurgitaiton of the aortic valve.  2. There is no vegetation seen on the pacemaker leads.  3. The aortic valve is trileaflet with moderate calcification and moderately reduced motion. 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 There is mild aortic stenosis. Vmax 2.2 m/s, MG 12.5 mmHG, AVA 1.65 cm2. The aortic valve is tricuspid. There is moderate calcification of the aortic valve. There is moderate thickening of the aortic valve. Aortic valve regurgitation is not visualized. Mild aortic valve stenosis.  4. Left ventricular ejection fraction, by estimation, is 50 to 55%. The left ventricle has low normal function.  5. Right ventricular systolic function is moderately reduced. The right ventricular size is moderately enlarged.  6. Left atrial size was mildly dilated. No left atrial/left atrial appendage thrombus was detected. The LAA emptying velocity was 49 cm/s.  7. The mitral valve is grossly normal. Mild mitral valve regurgitation. No evidence of mitral stenosis.  8. Moderate to severe tricuspid regurgitation is present. 2D PISA 0.9 cm. 2D VC 0.5 cm, 2D ERO 0.44 cm2, R vol 36 cc. The  tricuspid valve is abnormal. Tricuspid valve regurgitation is moderate to severe. FINDINGS  Left Ventricle: Left ventricular ejection fraction, by estimation, is 50 to 55%. The left ventricle has low normal function. The left ventricular internal cavity size was normal in size. Right Ventricle: The right ventricular size is moderately enlarged. No increase in right ventricular wall thickness. Right ventricular systolic function is moderately reduced. Left Atrium: Left atrial size was mildly dilated. No left atrial/left  atrial appendage thrombus was detected. The LAA emptying velocity was 49 cm/s. Right Atrium: Right atrial size was normal in size. Pericardium: There is no evidence of pericardial effusion. Mitral Valve: The mitral valve is grossly normal. Mild mitral valve regurgitation. No evidence of mitral valve stenosis. There is no evidence of mitral valve vegetation. Tricuspid Valve: Moderate to severe tricuspid regurgitation is present. 2D PISA 0.9 cm. 2D VC 0.5 cm, 2D ERO 0.44 cm2, R vol 36 cc. The tricuspid valve is abnormal. Tricuspid valve regurgitation is moderate to severe. No evidence of tricuspid stenosis. There is no evidence of tricuspid valve vegetation. Aortic Valve: The aortic valve is trileaflet with moderate calcification and moderately reduced motion. 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 There is mild aortic stenosis. Vmax 2.2 m/s, MG 12.5 mmHG, AVA 1.65 cm2. The aortic valve is tricuspid. There is moderate calcification of the aortic valve. There is moderate thickening of the aortic valve. Aortic valve regurgitation is not visualized. Mild aortic stenosis is present. Aortic valve mean gradient measures 12.5 mmHg. Aortic valve peak gradient measures 19.9 mmHg. Aortic valve area, by VTI measures 1.65 cm. Pulmonic Valve: The pulmonic valve was grossly normal. Pulmonic valve regurgitation is trivial. No evidence of pulmonic stenosis. There is no evidence of pulmonic valve vegetation. Aorta:  The aortic root is normal in size and structure. Venous: The right upper pulmonary vein, right lower pulmonary vein, left lower pulmonary vein and left upper pulmonary vein are normal. IAS/Shunts: The interatrial septum appears to be lipomatous. No atrial level shunt detected by color flow Doppler. Additional Comments: A pacer wire is visualized in the right atrium and right ventricle.  LEFT VENTRICLE PLAX 2D LVOT diam:     2.45 cm LV SV:         69 LV SV Index:   29 LVOT Area:     4.71 cm  AORTIC VALVE AV Area (Vmax):    1.89 cm AV Area (Vmean):   1.70 cm AV Area (VTI):     1.65 cm AV Vmax:           223.00 cm/s AV Vmean:          166.000 cm/s AV VTI:            0.420 m AV Peak Grad:      19.9 mmHg AV Mean Grad:      12.5 mmHg LVOT Vmax:         89.40 cm/s LVOT Vmean:        59.900 cm/s LVOT VTI:          0.147 m LVOT/AV VTI ratio: 0.35  AORTA Ao Root diam: 3.60 cm Ao Asc diam:  3.90 cm TRICUSPID VALVE TR Peak grad:   47.9 mmHg TR Mean grad:   22.0 mmHg TR Vmax:        346.00 cm/s TR Vmean:       208.0 cm/s  SHUNTS Systemic VTI:  0.15 m Systemic Diam: 2.45 cm Eleonore Chiquito MD Electronically signed by Eleonore Chiquito MD Signature Date/Time: 12/11/2020/2:01:33 PM    Final     Patient Profile     80 y.o. male with a hx of ideopathic Pulmonary fibrosis (Dr. Chase Caller, gets infusions of pamrevlumab every 3 weeks), sleep apnea,  CKD (III), DM, HTN, HLD, OSA w/CPAP, advanced heart block w/PPM, CAD (CABG (LIMA-LAD, SVG-Om1, SVG-PDA) on 11/05/20 at [facility] by Dr. Lincoln Brigham, discharged 11/16/20 ) VHD (mod AS), new AFlutter admitted with generalized malaise, febrile illness   Found  with MSSA bacteremia, R foot wounds, and PVD     Device information  Biotronik PPM implanted 03/30/20 Interrogation from 12/08/20 was reviewed Initial AF detection was 11/26/20 In review with industry, has been 100% AF since 12/02/20  Assessment & Plan    1. New onset AFib/flutter     CHA2DS2Vasc is 5, started on Eliquis, appropriately  dosed, started 12/08/20     His [place] will need interruption and no plans currently for DCCV      2. MSSA bacteremia (2/2 BC were  +)     ID on board   planning on PPM system removal early next week   3. CAD     CABG Dec 2021     No anginal symptoms      On ASA, statin, BB   4. PPM     Advanced heart block and symptomatic bradycardia indication  Plan for PPM system removal early this week  For questions or updates, please contact [place] Please consult [email or web address] for contact info under Cardiology/STEMI.  Signed, Vickie Epley, MD  12/12/2020, 8:17 AM

## 2020-12-12 NOTE — Consult Note (Signed)
[place] [facility] Nurse Consult Note: Reason for Consult: Patient previously seen by this writer on 12/09/20.  Right foot wound is being managed by [facility] Orthopedics. Sternal wound requires no dressing. Buttock and intragluteal wounds with POC in place.  I will add a mattress replacement today as with his pressure redistribution boots, nursing reports patient returns himself to the supine position frequently after positioning. Wound type: Pressure Pressure Injury POA: Yes Measurement: Not measured today Wound bed: Unchanged Drainage (amount, consistency, odor) Small amount serous  Periwound: intact Dressing procedure/placement/frequency:WIll continue present POC as it is appropriate and add a mattress replacement  with low air loss feature as noted above.  Discussed with Dr. Raelyn Mora via [place] that it would be helpful to have a photo of the patient's intragluteal and buttock wounds and he will take a photo later today. His assistance is appreciated.  [place] nursing team will not follow, but will remain available to this patient, the nursing and medical teams.  Please re-consult if needed. Thanks, Maudie Flakes, MSN, RN, [place], Arther Abbott  Pager# [phone]

## 2020-12-12 NOTE — Progress Notes (Signed)
ANTICOAGULATION CONSULT NOTE - Follow Up Consult  Pharmacy Consult for Apixaban to Heparin Indication: atrial fibrillation  Allergies  Allergen Reactions  . Codeine Hives and Itching  . Ofev [Nintedanib] Diarrhea    SEVERE DIARRHEA  . Other Diarrhea and Other (See Comments)    Esbriet causes elevated LFTs. D/C on 06/14/17 and SEVERE DIARRHEA    Patient Measurements: Height: 6' (182.9 cm) Weight: 119.2 kg (262 lb 12.6 oz) IBW/kg (Calculated) : 77.6 Heparin Dosing Weight: 103 kg  Vital Signs: Temp: 98.6 F (37 C) (01/07 2348) Temp Source: Oral (01/07 2348) BP: 119/58 (01/07 2348) Pulse Rate: 88 (01/07 2348)  Labs: Recent Labs    12/10/20 0354 12/11/20 0436 12/11/20 1101 12/11/20 2014 12/12/20 0203  HGB 9.6* 9.1*  --   --  9.3*  HCT 28.4* 28.1*  --   --  27.5*  PLT 198 174  --   --  168  APTT  --   --  57* 60* 72*  HEPARINUNFRC  --   --  1.64*  --   --   CREATININE 1.78* 1.55*  --   --   --     Estimated Creatinine Clearance: 51.5 mL/min (A) (by C-G formula based on SCr of 1.55 mg/dL (H)).  Assessment: 80 year old male with hx of atrial fibrillation to begin heparin after vascular procedure today; last dose of apixaban was on 1/5 PM. Given recent apixaban exposure, will monitor anticoagulation using aPTT until aPTT and heparin levels correlate.  PTT 72 sec (therapeutic) on gtt at 1450 units/hr. No bleeding noted.  Goal of Therapy:  aPTT 66-102 seconds seconds Monitor platelets by anticoagulation protocol: Yes  Heparin level 0.3-0.7 units/ml   Plan:  Continue heparin at 1450 units/hr Will f/u 6 hr confirmatory PTT  Sherlon Handing, PharmD, BCPS Please see amion for complete clinical pharmacist phone list  12/12/2020 3:40 AM

## 2020-12-12 NOTE — Progress Notes (Signed)
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  Patient came to ER with severe weakness.  Admitted with new onset A-flutter, generalized weakness and malaise.  Right foot wound.  Ultimately found to have MSSA bacteremia with suspected endocarditis thought to be from right foot wound. Seen and followed by cardiology, orthopedics, vascular surgery and infectious disease. 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 Fib: Rate controlled.  Initially on Eliquis.  Currently on heparin intraoperatively.  Stable.  Followed by cardiology.  Generalized weakness and fatigue: Multifactorial.  Dehydration.  Infection.  AKI.  Work with PT OT.  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  No aortic regurgitation. ID recommended removal of pacemaker, scheduled for 1/10. CT scan of the right foot wound with no evidence of abscess or osteomyelitis.  Orthopedics recommended conservative management. Status post aortogram, right lower extremity angiogram, right superficial femoral and above-knee popliteal drug-eluting stent placement.  Currently on aspirin, Plavix and Eliquis that is replaced by heparin. Followed by surgery.   Type 2 diabetes with renal complications, peripheral neuropathy and hypoglycemia: Blood sugars better now on reduced dose of insulin.  Continue current doses.  CAD status post CABG: No anginal symptoms.  Currently remains on aspirin, Lipitor and metoprolol.  OSA on CPAP: Continue CPAP.  AKI with underlying history of stage IIIa chronic kidney disease: Noted baseline creatinine about 1.2-1.5.  Remains at about baseline.  Anemia of chronic disease.  Stable.  Interstitial fibrosis: With risk of pulmonary complications.  Followed by pulmonary.  Start aggressive PT OT, start incentive spirometry and flutter valve therapy.  Mobilize.   DVT prophylaxis: SCD's Start: 12/10/20 2311, heparin infusion   Code Status: Full code Family Communication: Wife at the bedside Disposition Plan: Status is: Inpatient  Remains inpatient appropriate because:Inpatient level of care appropriate due to severity of illness   Dispo: The patient is from: Home              Anticipated d/c is to: Home              Anticipated d/c date is: > 3 days              Patient currently is not medically stable to d/c.  Consultants:   Cardiology  Infectious disease  Vascular surgery  Orthopedic  Procedures:   TEE 1/7  Right lower extremity bypass 1/6  Antimicrobials:  Anti-infectives (From admission, onward)   Start     Dose/Rate Route Frequency Ordered  Stop   12/09/20 1430  ceFAZolin (ANCEF) IVPB 2g/100 mL premix        2 g 200 mL/hr over 30 Minutes Intravenous Every 8 hours 12/09/20 1418           Subjective: Patient seen and examined.  Wife at the bedside.  Overnight he has more secretions and thick sputum production.  He could not wear CPAP as he was worried about choking on his cough.  Remains afebrile.  Objective: Vitals:   12/11/20 2348 12/12/20 0550 12/12/20 0800 12/12/20 0841  BP: (!) 119/58 (!) 116/53  (!) 107/92  Pulse: 88 71  74  Resp: (!) 22 20    Temp: 98.6 F (37 C) 99 F (37.2 C)    TempSrc: Oral Oral    SpO2: 100% 100% 100%   Weight:  122.1 kg    Height:  6' (1.829 m)      Intake/Output Summary (Last 24 hours) at 12/12/2020 1123 Last data filed at 12/11/2020 1800 Gross per 24 hour  Intake 240.97 ml  Output --  Net 240.97 ml   Filed Weights   12/11/20 0520 12/11/20 0909 12/12/20 0550  Weight: 119.2 kg 119.2 kg 122.1 kg    Examination:  General exam: Appears calm and comfortable  On 2 L oxygen. Comfortable at rest.  He starts coughing on deep breathing. Respiratory system: Mostly clear.  He does have bilateral basal crackles inspiratory and expiratory. Cardiovascular system: S1 & S2 heard, RRR.  Pacemaker in place.  Nontender.  Gastrointestinal system: Soft.  Nontender.  Bowel sounds present.  Obese and pendulous. Central nervous system: Alert and oriented. No focal neurological deficits. Extremities: Symmetric 5 x 5 power. Skin:  Sternotomy wound is healing. He has 2 small wound on his buttock, pictures in the chart Right foot lateral wound, erythematous and swollen, minimal drainage present.  Pictures in the chart.          Data Reviewed: I have personally reviewed following labs and imaging studies  CBC: Recent Labs  Lab 12/08/20 1805 12/09/20 0304 12/10/20 0354 12/11/20 0436 12/12/20 0203  WBC 10.2 8.2 11.9* 10.4 9.1  NEUTROABS 7.3 5.4  --   --   --   HGB 11.9* 10.7* 9.6* 9.1*  9.3*  HCT 35.8* 32.4* 28.4* 28.1* 27.5*  MCV 95.0 94.5 93.4 94.6 93.9  PLT 257 218 198 174 109   Basic Metabolic Panel: Recent Labs  Lab 12/08/20 1805 12/08/20 2045 12/09/20 0304 12/10/20 0354 12/11/20 0436 12/12/20 0203  NA 131*  --  129* 131* 129* 134*  K 4.5  --  4.6 4.2 3.9 4.2  CL 92*  --  93* 96* 99 102  CO2 26  --  24 25 21* 23  GLUCOSE 143*  --  153* 70 114* 162*  BUN 37*  --  37* 31* 28* 31*  CREATININE 1.58*  --  1.75* 1.78* 1.55* 1.52*  CALCIUM 9.3  --  8.4* 7.9* 7.4* 7.8*  MG  --  1.7 1.9  --   --   --    GFR: Estimated Creatinine Clearance: 53.2 mL/min (A) (by C-G formula based on SCr of 1.52 mg/dL (H)). Liver Function Tests: Recent Labs  Lab 12/08/20 1805 12/09/20 0304  AST 20  19  ALT 19 17  ALKPHOS 90 80  BILITOT 1.2 1.0  PROT 7.3 6.5  ALBUMIN 3.0* 2.5*   No results for input(s): LIPASE, AMYLASE in the last 168 hours. No results for input(s): AMMONIA in the last 168 hours. Coagulation Profile: No results for input(s): INR, PROTIME in the last 168 hours. Cardiac Enzymes: No results for input(s): CKTOTAL, CKMB, CKMBINDEX, TROPONINI in the last 168 hours. BNP (last 3 results) No results for input(s): PROBNP in the last 8760 hours. HbA1C: No results for input(s): HGBA1C in the last 72 hours. CBG: Recent Labs  Lab 12/11/20 0803 12/11/20 1145 12/11/20 1612 12/11/20 2156 12/12/20 0748  GLUCAP 91 89 105* 124* 144*   Lipid Profile: No results for input(s): CHOL, HDL, LDLCALC, TRIG, CHOLHDL, LDLDIRECT in the last 72 hours. Thyroid Function Tests: No results for input(s): TSH, T4TOTAL, FREET4, T3FREE, THYROIDAB in the last 72 hours. Anemia Panel: No results for input(s): VITAMINB12, FOLATE, FERRITIN, TIBC, IRON, RETICCTPCT in the last 72 hours. Sepsis Labs: Recent Labs  Lab 12/08/20 1805 12/08/20 2045  LATICACIDVEN 2.1* 1.5    Recent Results (from the past 240 hour(s))  Blood culture (routine x 2)     Status: Abnormal   Collection Time:  12/08/20  6:05 PM   Specimen: BLOOD  Result Value Ref Range Status   Specimen Description BLOOD RIGHT ANTECUBITAL  Final   Special Requests   Final    BOTTLES DRAWN AEROBIC AND ANAEROBIC Blood Culture adequate volume   Culture  Setup Time   Final    GRAM POSITIVE COCCI IN CLUSTERS IN BOTH AEROBIC AND ANAEROBIC BOTTLES Organism ID to follow CRITICAL RESULT CALLED TO, READ BACK BY AND VERIFIED WITH: A WOLFE PHARMD 1407 12/09/20 A BROWNING Performed at [facility], [place] [address], [place], [place] [address]    Culture STAPHYLOCOCCUS AUREUS (A)  Final   Report Status 12/12/2020 FINAL  Final   Organism ID, Bacteria STAPHYLOCOCCUS AUREUS  Final      Susceptibility   Staphylococcus aureus - MIC*    CIPROFLOXACIN <=0.5 SENSITIVE Sensitive     ERYTHROMYCIN <=0.25 SENSITIVE Sensitive     GENTAMICIN <=0.5 SENSITIVE Sensitive     OXACILLIN 0.5 SENSITIVE Sensitive     TETRACYCLINE <=1 SENSITIVE Sensitive     VANCOMYCIN 1 SENSITIVE Sensitive     TRIMETH/SULFA <=10 SENSITIVE Sensitive     CLINDAMYCIN <=0.25 SENSITIVE Sensitive     RIFAMPIN <=0.5 SENSITIVE Sensitive     Inducible Clindamycin NEGATIVE Sensitive     * STAPHYLOCOCCUS AUREUS  Blood culture (routine x 2)     Status: Abnormal   Collection Time: 12/08/20  6:05 PM   Specimen: BLOOD  Result Value Ref Range Status   Specimen Description BLOOD LEFT ANTECUBITAL  Final   Special Requests   Final    BOTTLES DRAWN AEROBIC AND ANAEROBIC Blood Culture adequate volume   Culture  Setup Time   Final    GRAM POSITIVE COCCI IN CLUSTERS IN BOTH AEROBIC AND ANAEROBIC BOTTLES CRITICAL VALUE NOTED.  VALUE IS CONSISTENT WITH PREVIOUSLY REPORTED AND CALLED VALUE.    Culture (A)  Final    STAPHYLOCOCCUS AUREUS SUSCEPTIBILITIES PERFORMED ON PREVIOUS CULTURE WITHIN THE LAST 5 DAYS. Performed at [facility], [place] [address], [place],  [address]    Report Status 12/12/2020 FINAL  Final  Resp Panel by RT-PCR (Flu A&B, Covid)  Nasopharyngeal Swab     Status: None   Collection Time: 12/08/20  6:05 PM  Specimen: Nasopharyngeal Swab; Nasopharyngeal(NP) swabs in vial transport medium  Result Value Ref Range Status   SARS Coronavirus 2 by RT PCR NEGATIVE NEGATIVE Final    Comment: (NOTE) SARS-CoV-2 target nucleic acids are NOT DETECTED.  The SARS-CoV-2 RNA is generally detectable in upper respiratory specimens during the acute phase of infection. The lowest concentration of SARS-CoV-2 viral copies this assay can detect is 138 copies/mL. A negative result does not preclude SARS-Cov-2 infection and should not be used as the sole basis for treatment or other patient management decisions. A negative result may occur with  improper specimen collection/handling, submission of specimen other than nasopharyngeal swab, presence of viral mutation(s) within the areas targeted by this assay, and inadequate number of viral copies(<138 copies/mL). A negative result must be combined with clinical observations, patient history, and epidemiological information. The expected result is Negative.  Fact Sheet for Patients:  EntrepreneurPulse.com.au  Fact Sheet for Healthcare Providers:  IncredibleEmployment.be  This test is no t yet approved or cleared by the [place] FDA and  has been authorized for detection and/or diagnosis of SARS-CoV-2 by FDA under an Emergency Use Authorization (EUA). This EUA will remain  in effect (meaning this test can be used) for the duration of the COVID-19 declaration under Section 564(b)(1) of the Act, 21 U.S.C.section 360bbb-3(b)(1), unless the authorization is terminated  or revoked sooner.       Influenza A by PCR NEGATIVE NEGATIVE Final   Influenza B by PCR NEGATIVE NEGATIVE Final    Comment: (NOTE) The Xpert Xpress SARS-CoV-2/FLU/RSV plus assay is intended as an aid in the diagnosis of influenza from Nasopharyngeal swab specimens and should not be  used as a sole basis for treatment. Nasal washings and aspirates are unacceptable for Xpert Xpress SARS-CoV-2/FLU/RSV testing.  Fact Sheet for Patients: EntrepreneurPulse.com.au  Fact Sheet for Healthcare Providers: IncredibleEmployment.be  This test is not yet approved or cleared by the [place] FDA and has been authorized for detection and/or diagnosis of SARS-CoV-2 by FDA under an Emergency Use Authorization (EUA). This EUA will remain in effect (meaning this test can be used) for the duration of the COVID-19 declaration under Section 564(b)(1) of the Act, 21 U.S.C. section 360bbb-3(b)(1), unless the authorization is terminated or revoked.  Performed at [facility], [place] [address], [place], [place] [address]   Blood Culture ID Panel (Reflexed)     Status: Abnormal   Collection Time: 12/08/20  6:05 PM  Result Value Ref Range Status   Enterococcus faecalis NOT DETECTED NOT DETECTED Final   Enterococcus Faecium NOT DETECTED NOT DETECTED Final   Listeria monocytogenes NOT DETECTED NOT DETECTED Final   Staphylococcus species DETECTED (A) NOT DETECTED Final    Comment: CRITICAL RESULT CALLED TO, READ BACK BY AND VERIFIED WITH: A WOLFE PHARMD 1407 12/09/20 A BROWNING    Staphylococcus aureus (BCID) DETECTED (A) NOT DETECTED Final    Comment: CRITICAL RESULT CALLED TO, READ BACK BY AND VERIFIED WITH: A WOLFE PHARMD 1407 12/09/20 A BROWNING    Staphylococcus epidermidis NOT DETECTED NOT DETECTED Final   Staphylococcus lugdunensis NOT DETECTED NOT DETECTED Final   Streptococcus species NOT DETECTED NOT DETECTED Final   Streptococcus agalactiae NOT DETECTED NOT DETECTED Final   Streptococcus pneumoniae NOT DETECTED NOT DETECTED Final   Streptococcus pyogenes NOT DETECTED NOT DETECTED Final   A.calcoaceticus-baumannii NOT DETECTED NOT DETECTED Final   Bacteroides fragilis NOT DETECTED NOT DETECTED Final   Enterobacterales NOT DETECTED NOT  DETECTED Final   Enterobacter cloacae  complex NOT DETECTED NOT DETECTED Final   Escherichia coli NOT DETECTED NOT DETECTED Final   Klebsiella aerogenes NOT DETECTED NOT DETECTED Final   Klebsiella oxytoca NOT DETECTED NOT DETECTED Final   Klebsiella pneumoniae NOT DETECTED NOT DETECTED Final   Proteus species NOT DETECTED NOT DETECTED Final   Salmonella species NOT DETECTED NOT DETECTED Final   Serratia marcescens NOT DETECTED NOT DETECTED Final   Haemophilus influenzae NOT DETECTED NOT DETECTED Final   Neisseria meningitidis NOT DETECTED NOT DETECTED Final   Pseudomonas aeruginosa NOT DETECTED NOT DETECTED Final   Stenotrophomonas maltophilia NOT DETECTED NOT DETECTED Final   Candida albicans NOT DETECTED NOT DETECTED Final   Candida auris NOT DETECTED NOT DETECTED Final   Candida glabrata NOT DETECTED NOT DETECTED Final   Candida krusei NOT DETECTED NOT DETECTED Final   Candida parapsilosis NOT DETECTED NOT DETECTED Final   Candida tropicalis NOT DETECTED NOT DETECTED Final   Cryptococcus neoformans/gattii NOT DETECTED NOT DETECTED Final   Meth resistant mecA/C and MREJ NOT DETECTED NOT DETECTED Final    Comment: Performed at [facility], [place] [address], [place], [place] [address]  Culture, blood (routine x 2)     Status: None (Preliminary result)   Collection Time: 12/11/20  4:36 AM   Specimen: BLOOD LEFT HAND  Result Value Ref Range Status   Specimen Description BLOOD LEFT HAND  Final   Special Requests   Final    BOTTLES DRAWN AEROBIC AND ANAEROBIC Blood Culture adequate volume   Culture   Final    NO GROWTH 1 DAY Performed at [facility], [place] [address], [place], [place] [address]    Report Status PENDING  Incomplete  Culture, blood (routine x 2)     Status: None (Preliminary result)   Collection Time: 12/11/20  4:37 AM   Specimen: BLOOD RIGHT HAND  Result Value Ref Range Status   Specimen Description BLOOD RIGHT HAND  Final   Special Requests   Final     BOTTLES DRAWN AEROBIC AND ANAEROBIC Blood Culture adequate volume   Culture   Final    NO GROWTH 1 DAY Performed at [facility], [place] [address] [place], [place] [address]    Report Status PENDING  Incomplete         Radiology Studies: PERIPHERAL VASCULAR CATHETERIZATION  Result Date: 12/10/2020 DATE OF SERVICE: 12/10/2020 PATIENT:  Candise Bowens Echeverry  80 y.o. male PRE-OPERATIVE DIAGNOSIS:  pvd POST-OPERATIVE DIAGNOSIS:  * No post-op diagnosis entered * PROCEDURE:  1) US guided LCFA access 2) Aortogram 3) RLE angiogram (63mL iodinated contrast) 4) R superficial femoral / above-knee popliteal stenting (6x137mm, 6x12mm, 6x67mm x 2 Eluvia - total treatment length 314mm) SURGEON:  Surgeon(s) and Role:    * Cherre Robins, MD - Primary ASSISTANT: none ANESTHESIA:   local EBL: min BLOOD ADMINISTERED:none DRAINS: none LOCAL MEDICATIONS USED:  LIDOCAINE SPECIMEN:  none COUNTS: confirmed correct. TOURNIQUET:  * No tourniquets in log * PATIENT DISPOSITION:  PACU - hemodynamically stable.  Delay start of Pharmacological VTE agent (>24hrs) due to surgical blood loss or risk of bleeding: no INDICATION FOR PROCEDURE: Iwao Shamblin is a 80 y.o. 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 After careful discussion of risks, benefits, and alternatives the patient was offered angiography. We specifically discussed access site complications and contrast nephropathy. The patient understood and wished to proceed. OPERATIVE FINDINGS: Aorta:  no flow limiting disease Right iliac arteries: no flow limiting disease Left iliac arteries: no flow limiting disease Right femoral arteries: SFA occluded just distal to origin. Reconstitution in distal SFA. Right popliteal arteries: Above knee popliteal artery with focal >70% stenosis. Right tibial trifurcation: Peroneal dominant flow to the foot. PT occludes in the calf. Right  tibial vessels: Peroneal fills the foot briskly. Good pedal flow. DESCRIPTION OF PROCEDURE: After identification of the patient in the pre-operative holding area, the patient was transferred to the operating room. The patient was positioned supine on the operating room table. Anesthesia was induced. The groins was prepped and draped in standard fashion. A surgical pause was performed confirming correct patient, procedure, and operative location. The left groin was anesthetized with subcutaneous injection of 1% lidocaine. Using ultrasound guidance, the left common femoral artery was accessed with micropuncture technique. Fluoroscopy was used to confirm cannulation over the femoral head. Sheathogram was not performed. The 67F sheath was upsized to 68F. An 035 glidewire advantage was advanced into the distal aorta. Over the wire an omni flush catheter was advanced to the level of L2. Aortogram was performed - see above for details. The right common iliac artery was selected with the 035 glidewire advantage. The wire was advanced into the superficial femoral artery. Over the wire the omni flush catheter was advanced into the external iliac artery. Selective angiography was performed - see above for details. The decision was made to intervene. The patient was heparinized with 10000 units of heparin. The sheath was exchanged for a 71F x 45cm sheath. Selective angiography of the left lower extremity was performed prior to intervention. The SFA and popliteal lesions were treated with stenting (6x171mm, 6x155mm, 6x31mm x 2 Eluvia). The stents were post dilated with a 5x232mm Mustang balloon. Completion angiography revealed resolution of the stenosis / occlusion. A perclose device was used to close the arteriotomy. Hemostasis was excellent upon completion. Upon completion of the case instrument and sharps counts were confirmed correct. The patient was transferred to the PACU in good condition. I was present for all portions of  the procedure. [name]. Stanford Breed, MD [facility] Phone Number: [phone] 12/10/2020 6:36 PM   ECHO TEE  Result Date: 12/11/2020    TRANSESOPHOGEAL ECHO REPORT   Patient Name:   QUINTAN SALDIVAR Date of Exam: 12/11/2020 Medical Rec #:  [id number]        Height:       72.0 in Accession #:    [id number]       Weight:       262.8 lb Date of Birth:  1941/08/23        BSA:          2.393 m Patient Age:    64 years         BP:           114/54 mmHg Patient Gender: M                HR:           83 bpm. Exam Location:  Inpatient Procedure: 2D Echo, 3D Echo, Transesophageal Echo and Color Doppler Indications:     Bacteremia  History:         Patient has prior history of Echocardiogram examinations, most                  recent 03/29/2020. Risk Factors:Sleep Apnea.  Sonographer:     Clayton Lefort RDCS (  AE) Referring Phys:  [id number] [place] Diagnosing Phys: Eleonore Chiquito MD PROCEDURE: After discussion of the risks and benefits of a TEE, an informed consent was obtained from the patient. TEE procedure time was 33 minutes. The transesophogeal probe was passed without difficulty through the esophogus of the patient. Imaged were obtained with the patient in a left lateral decubitus position. Local oropharyngeal anesthetic was provided with Cetacaine. Sedation performed by different physician. The patient was monitored while under deep sedation. Anesthestetic sedation was provided intravenously by Anesthesiology: 587mg  of Propofol, 60mg  of Lidocaine. Image quality was excellent. The patient's vital signs; including heart rate, blood pressure, and oxygen saturation; remained stable throughout the procedure. The patient developed no complications during the procedure. IMPRESSIONS  1. There is small fibrous stranding attached to the leaflet tips of the aortic valve that could be related to valve degeneration. 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 In the setting of bacteremia,  cannot exclude endocarditis. The stranding is small (0.5 cm x 0.2 cm) and there is no significant regurgitaiton of the aortic valve.  2. There is no vegetation seen on the pacemaker leads.  3. The aortic valve is trileaflet with moderate calcification and moderately reduced motion. 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 There is mild aortic stenosis. Vmax 2.2 m/s, MG 12.5 mmHG, AVA 1.65 cm2. The aortic valve is tricuspid. There is moderate calcification of the aortic valve. There is moderate thickening of the aortic valve. Aortic valve regurgitation is not visualized. Mild aortic valve stenosis.  4. Left ventricular ejection fraction, by estimation, is 50 to 55%. The left ventricle has low normal function.  5. Right ventricular systolic function is moderately reduced. The right ventricular size is moderately enlarged.  6. Left atrial size was mildly dilated. No left atrial/left atrial appendage thrombus was detected. The LAA emptying velocity was 49 cm/s.  7. The mitral valve is grossly normal. Mild mitral valve regurgitation. No evidence of mitral stenosis.  8. Moderate to severe tricuspid regurgitation is present. 2D PISA 0.9 cm. 2D VC 0.5 cm, 2D ERO 0.44 cm2, R vol 36 cc. The tricuspid valve is abnormal. Tricuspid valve regurgitation is moderate to severe. FINDINGS  Left Ventricle: Left ventricular ejection fraction, by estimation, is 50 to 55%. The left ventricle has low normal function. The left ventricular internal cavity size was normal in size. Right Ventricle: The right ventricular size is moderately enlarged. No increase in right ventricular wall thickness. Right ventricular systolic function is moderately reduced. Left Atrium: Left atrial size was mildly dilated. No left atrial/left atrial appendage thrombus was detected. The LAA emptying velocity was 49 cm/s. Right Atrium: Right atrial size was normal in size. Pericardium: There is no evidence of pericardial effusion. Mitral Valve: The mitral valve is  grossly normal. Mild mitral valve regurgitation. No evidence of mitral valve stenosis. There is no evidence of mitral valve vegetation. Tricuspid Valve: Moderate to severe tricuspid regurgitation is present. 2D PISA 0.9 cm. 2D VC 0.5 cm, 2D ERO 0.44 cm2, R vol 36 cc. The tricuspid valve is abnormal. Tricuspid valve regurgitation is moderate to severe. No evidence of tricuspid stenosis. There is no evidence of tricuspid valve vegetation. Aortic Valve: The aortic valve is trileaflet with moderate calcification and moderately reduced motion. 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 There is mild aortic stenosis. Vmax 2.2 m/s, MG 12.5 mmHG, AVA 1.65 cm2. The aortic valve is tricuspid. There is moderate calcification of the aortic valve. There is moderate thickening of the aortic valve.  Aortic valve regurgitation is not visualized. Mild aortic stenosis is present. Aortic valve mean gradient measures 12.5 mmHg. Aortic valve peak gradient measures 19.9 mmHg. Aortic valve area, by VTI measures 1.65 cm. Pulmonic Valve: The pulmonic valve was grossly normal. Pulmonic valve regurgitation is trivial. No evidence of pulmonic stenosis. There is no evidence of pulmonic valve vegetation. Aorta: The aortic root is normal in size and structure. Venous: The right upper pulmonary vein, right lower pulmonary vein, left lower pulmonary vein and left upper pulmonary vein are normal. IAS/Shunts: The interatrial septum appears to be lipomatous. No atrial level shunt detected by color flow Doppler. Additional Comments: A pacer wire is visualized in the right atrium and right ventricle.  LEFT VENTRICLE PLAX 2D LVOT diam:     2.45 cm LV SV:         69 LV SV Index:   29 LVOT Area:     4.71 cm  AORTIC VALVE AV Area (Vmax):    1.89 cm AV Area (Vmean):   1.70 cm AV Area (VTI):     1.65 cm AV Vmax:           223.00 cm/s AV Vmean:          166.000 cm/s AV VTI:            0.420 m AV Peak Grad:      19.9 mmHg AV Mean Grad:      12.5 mmHg LVOT  Vmax:         89.40 cm/s LVOT Vmean:        59.900 cm/s LVOT VTI:          0.147 m LVOT/AV VTI ratio: 0.35  AORTA Ao Root diam: 3.60 cm Ao Asc diam:  3.90 cm TRICUSPID VALVE TR Peak grad:   47.9 mmHg TR Mean grad:   22.0 mmHg TR Vmax:        346.00 cm/s TR Vmean:       208.0 cm/s  SHUNTS Systemic VTI:  0.15 m Systemic Diam: 2.45 cm Eleonore Chiquito MD Electronically signed by Eleonore Chiquito MD Signature Date/Time: 12/11/2020/2:01:33 PM    Final         Scheduled Meds: . albuterol      . aspirin EC  81 mg Oral Daily  . atorvastatin  40 mg Oral Daily  . clopidogrel  75 mg Oral Q breakfast  . [START ON 12/13/2020] furosemide  40 mg Oral Daily  . insulin aspart  0-9 Units Subcutaneous TID WC  . insulin aspart  7 Units Subcutaneous TID WC  . insulin detemir  15 Units Subcutaneous QHS  . metoprolol tartrate  25 mg Oral BID  . multivitamin with minerals  1 tablet Oral Daily  . pantoprazole  40 mg Oral Daily  . Ensure Max Protein  11 oz Oral BID  . sodium chloride flush  3 mL Intravenous Q12H   Continuous Infusions: . sodium chloride    .  ceFAZolin (ANCEF) IV 2 g (12/11/20 2212)  . heparin 1,450 Units/hr (12/11/20 2146)     LOS: 3 days    Time spent: 30 minutes    Barb Merino, MD [facility] Pager [phone]

## 2020-12-12 NOTE — Progress Notes (Signed)
Pharmacy Antibiotic Note  Shane [name] is a 80 y.o. male admitted on 12/08/2020 found to have MSSA bacteremia (R foot wound likely source) with concern for endocarditis. Pharmacy has been consulted for cefazolin dosing.  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 Per ID, given TEE findings recommend device removal followed by antibiotics prior to reimplantation, continue cefazolin. Per cards, planning on PPM system removal early next week.   Today is day 4 of cefazolin. Scr ~1.5. Dose still appropriate for renal function.   Plan: Continue cefazolin 2g IV q8h Follow up repeat blood cultures  Height: 6' (182.9 cm) Weight: 122.1 kg (269 lb 2.9 oz) IBW/kg (Calculated) : 77.6  Temp (24hrs), Avg:98.4 F (36.9 C), Min:97.7 F (36.5 C), Max:99 F (37.2 C)  Recent Labs  Lab 12/08/20 1805 12/08/20 2045 12/09/20 0304 12/10/20 0354 12/11/20 0436 12/12/20 0203  WBC 10.2  --  8.2 11.9* 10.4 9.1  CREATININE 1.58*  --  1.75* 1.78* 1.55* 1.52*  LATICACIDVEN 2.1* 1.5  --   --   --   --     Estimated Creatinine Clearance: 53.2 mL/min (A) (by C-G formula based on SCr of 1.52 mg/dL (H)).    Allergies  Allergen Reactions  . Codeine Hives and Itching  . Ofev [Nintedanib] Diarrhea    SEVERE DIARRHEA  . Other Diarrhea and Other (See Comments)    Esbriet causes elevated LFTs. D/C on 06/14/17 and SEVERE DIARRHEA    Antimicrobials this admission: Cefazolin 1/5 >>   Dose adjustments this admission:   Microbiology results: 1/4 BCx: MSSA 1/7 BCx: sent  Thank you for allowing pharmacy to be a part of this patient's care.  Christy Gentles 12/12/2020 8:11 AM

## 2020-12-12 NOTE — Progress Notes (Signed)
C-pap removed and pt placed on 4L via nasal cannula. Pt began coughing in his sleep while laying flat. Pt educated on keeping head of bed elevated and potential risk of aspiration with coughing while on C-pap. Will continue to monitor.   Elaina Hoops, RN

## 2020-12-12 NOTE — Progress Notes (Signed)
Pt stated he was told not to wear CPAP tonight due to him coughing a bunch of stuff up. I told him if he decided to wear it to tell his nurse to call me. RT will continue to monitor.

## 2020-12-13 DIAGNOSIS — E871 Hypo-osmolality and hyponatremia: Secondary | ICD-10-CM | POA: Diagnosis not present

## 2020-12-13 DIAGNOSIS — B9561 Methicillin susceptible Staphylococcus aureus infection as the cause of diseases classified elsewhere: Secondary | ICD-10-CM | POA: Diagnosis not present

## 2020-12-13 DIAGNOSIS — R531 Weakness: Secondary | ICD-10-CM | POA: Diagnosis not present

## 2020-12-13 DIAGNOSIS — I482 Chronic atrial fibrillation, unspecified: Secondary | ICD-10-CM | POA: Diagnosis not present

## 2020-12-13 DIAGNOSIS — R7881 Bacteremia: Secondary | ICD-10-CM | POA: Diagnosis not present

## 2020-12-13 DIAGNOSIS — Z959 Presence of cardiac and vascular implant and graft, unspecified: Secondary | ICD-10-CM | POA: Diagnosis not present

## 2020-12-13 DIAGNOSIS — E860 Dehydration: Secondary | ICD-10-CM | POA: Diagnosis not present

## 2020-12-13 LAB — GLUCOSE, CAPILLARY
Glucose-Capillary: 113 mg/dL — ABNORMAL HIGH (ref 70–99)
Glucose-Capillary: 119 mg/dL — ABNORMAL HIGH (ref 70–99)
Glucose-Capillary: 122 mg/dL — ABNORMAL HIGH (ref 70–99)
Glucose-Capillary: 124 mg/dL — ABNORMAL HIGH (ref 70–99)
Glucose-Capillary: 167 mg/dL — ABNORMAL HIGH (ref 70–99)
Glucose-Capillary: 95 mg/dL (ref 70–99)

## 2020-12-13 LAB — MRSA PCR SCREENING: MRSA by PCR: NEGATIVE

## 2020-12-13 LAB — CBC
HCT: 27.1 % — ABNORMAL LOW (ref 39.0–52.0)
Hemoglobin: 8.8 g/dL — ABNORMAL LOW (ref 13.0–17.0)
MCH: 30.4 pg (ref 26.0–34.0)
MCHC: 32.5 g/dL (ref 30.0–36.0)
MCV: 93.8 fL (ref 80.0–100.0)
Platelets: 160 10*3/uL (ref 150–400)
RBC: 2.89 MIL/uL — ABNORMAL LOW (ref 4.22–5.81)
RDW: 13.2 % (ref 11.5–15.5)
WBC: 9 10*3/uL (ref 4.0–10.5)
nRBC: 0 % (ref 0.0–0.2)

## 2020-12-13 LAB — APTT
aPTT: 54 seconds — ABNORMAL HIGH (ref 24–36)
aPTT: 52 seconds — ABNORMAL HIGH (ref 24–36)

## 2020-12-13 LAB — HEPARIN LEVEL (UNFRACTIONATED): Heparin Unfractionated: 0.48 IU/mL (ref 0.30–0.70)

## 2020-12-13 MED ORDER — CHLORHEXIDINE GLUCONATE 4 % EX LIQD
60.0000 mL | Freq: Once | CUTANEOUS | Status: AC
  Administered 2020-12-13: 4 via TOPICAL
  Filled 2020-12-13: qty 60

## 2020-12-13 MED ORDER — SODIUM CHLORIDE 0.9 % IV SOLN: INTRAVENOUS | Status: DC

## 2020-12-13 MED ORDER — CHLORHEXIDINE GLUCONATE 4 % EX LIQD
60.0000 mL | Freq: Once | CUTANEOUS | Status: AC
  Administered 2020-12-14: 4 via TOPICAL
  Filled 2020-12-13: qty 60

## 2020-12-13 MED ORDER — CEFAZOLIN SODIUM-DEXTROSE 2-4 GM/100ML-% IV SOLN
2.0000 g | INTRAVENOUS | Status: AC
  Administered 2020-12-14: 2 g via INTRAVENOUS
  Filled 2020-12-13: qty 100

## 2020-12-13 MED ORDER — SODIUM CHLORIDE 0.9 % IV SOLN
80.0000 mg | INTRAVENOUS | Status: AC
  Administered 2020-12-14: 80 mg
  Filled 2020-12-13 (×4): qty 2

## 2020-12-13 MED ORDER — VANCOMYCIN HCL 1500 MG/300ML IV SOLN
1500.0000 mg | INTRAVENOUS | Status: DC
  Filled 2020-12-13 (×2): qty 300

## 2020-12-13 NOTE — Evaluation (Signed)
Clinical/Bedside Swallow Evaluation Patient Details  Name: Shane [name] MRN: [id number] Date of Birth: Apr 11, 1941  Today's Date: 12/13/2020 Time: SLP Start Time (ACUTE ONLY): 4431 SLP Stop Time (ACUTE ONLY): 1403 SLP Time Calculation (min) (ACUTE ONLY): 22 min  Past Medical History:  Past Medical History:  Diagnosis Date  . 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 Cataract   . Chronic kidney disease    d/t DM  . CKD (chronic kidney disease), stage III ([place])   . Diabetes mellitus    Vgo disposal insulin bolus  simular to insulin pump  . Dyspnea   . GERD (gastroesophageal reflux disease)   . Hyperlipidemia   . Hypertension   . Idiopathic pulmonary fibrosis ([place]) 11/2016  . ILD (interstitial lung disease) ([place])   . Moderate aortic stenosis    a. 10/2019 Echo: EF 55-60%, Gr2 DD. Nl RV.   Marland Kitchen Neuromuscular disorder ([place])   . Nonobstructive CAD (coronary artery disease)    a. 2012 Cath: mod, nonobs dzs; b. 10/2016 MV: EF 60%, no ischemia.  . OSA on CPAP 05/05/2017   Unattended Home Sleep Test 7/2/813-AHI 38.6/hour, desaturation to 64%, body weight 261 pounds  . PONV (postoperative nausea and vomiting)   . Sleep apnea     uses cpap asked to bring mask and tubing   Past Surgical History:  Past Surgical History:  Procedure Laterality Date  . ABDOMINAL AORTOGRAM W/LOWER EXTREMITY N/A 12/10/2020   Procedure: ABDOMINAL AORTOGRAM W/LOWER EXTREMITY;  Surgeon: Cherre Robins, MD;  Location: [place] [facility];  Service: Cardiovascular;  Laterality: N/A;  . ANTERIOR FUSION CERVICAL SPINE  2012  . CARDIAC CATHETERIZATION  2011  . CARDIAC CATHETERIZATION N/A 11/09/2016   Procedure: Right Heart Cath;  Surgeon: Belva Crome, MD;  Location: [place] [facility];  Service: Cardiovascular;  Laterality: N/A;  . carpel tunnel     left wrist  . CATARACT EXTRACTION    . CATARACT EXTRACTION W/ INTRAOCULAR LENS  IMPLANT, BILATERAL  2013  . CERVICAL LAMINECTOMY  2012  . COLONOSCOPY N/A 01/14/2013   Procedure:  COLONOSCOPY;  Surgeon: Irene Shipper, MD;  Location: [facility];  Service: Endoscopy;  Laterality: N/A;  . CORONARY ARTERY BYPASS GRAFT  11/04/2020   LIMA-LAD, SVG-OM1, SVG-PDA (Dr [facility]) dc 11/18/2020  . EYE SURGERY    . [place]   left  . LEFT HEART CATH AND CORONARY ANGIOGRAPHY N/A 07/10/2020   Procedure: LEFT HEART CATH AND CORONARY ANGIOGRAPHY;  Surgeon: Sherren Mocha, MD;  Location: [place] [facility];  Service: Cardiovascular;  Laterality: N/A;  . LUMBAR LAMINECTOMY  2003  . LUNG BIOPSY Left 12/26/2016   Procedure: LUNG BIOPSY;  Surgeon: [name], MD;  Location: [facility];  Service: Thoracic;  Laterality: Left;  . PACEMAKER IMPLANT N/A 03/30/2020   Procedure: PACEMAKER IMPLANT;  Surgeon: Evans Lance, MD;  Location: [place] [facility];  Service: Cardiovascular;  Laterality: N/A;  . PERIPHERAL VASCULAR INTERVENTION Right 12/10/2020   Procedure: PERIPHERAL VASCULAR INTERVENTION;  Surgeon: Cherre Robins, MD;  Location: [place] [facility];  Service: Cardiovascular;  Laterality: Right;  SFA  . POSTERIOR FUSION CERVICAL SPINE  2012  . TEE WITHOUT CARDIOVERSION N/A 12/11/2020   Procedure: TRANSESOPHAGEAL ECHOCARDIOGRAM (TEE);  Surgeon: Geralynn Rile, MD;  Location: [place];  Service: Cardiovascular;  Laterality: N/A;  . TRIGGER FINGER RELEASE  2011   4th finger left hand  . 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   Assessment / Plan / Recommendation Clinical Impression  Pt's oral motor exam is [name] and his oropharyngeal swallow clinically appears to be functional until he begins to have a delayed coughing episode after intake concludes. His daughter reported a similar occurence earlier today when he ate lunch seemingly fine, but began coughing soon after when he started to recline in bed. Subjectively pt also describes trouble with solids more than liquids, occasionally coughing back up solid particles of food and feeling like pills have been getting "stuck." Suspect what may be a primary esophageal issue, but pharyngeal deficits cannot be excluded. Pt believes that his symptoms have greatly improved and declines MBS at this time. I think this is reasonable in light of no acute infection on chest imaging and only a remote h/o PNA (20+ years ago), but MD may wish to consider a more dedicated esophageal test. SLP reviewed aspiration and resophageal precautions and will plan to f/u briefly while inpatient for ongoing education and assessment. SLP Visit Diagnosis: Dysphagia, unspecified (R13.10)    Aspiration Risk  Mild aspiration risk    Diet Recommendation Regular;Thin liquid   Liquid Administration via: Cup;Straw Medication Administration: Whole meds with liquid Supervision: Patient able to self feed;Intermittent supervision to cue for compensatory strategies Compensations: Slow rate;Small sips/bites;Follow solids with liquid Postural Changes: Seated upright at 90 degrees;Remain upright for at least 30 minutes after po intake    Other  Recommendations Oral Care Recommendations: Oral care BID   Follow up Recommendations  (tba)      Frequency and Duration min 2x/week  1 week       Prognosis Prognosis for Safe Diet Advancement: Good      Swallow Study   General HPI: [name], never smoker with idiopathic  pulmonary fibrosis, GERD, CAD s/p CABG on 11/04/20, 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 Type of Study: Bedside Swallow Evaluation Previous Swallow Assessment: none in chart Diet Prior to this Study: Regular;Thin liquids Temperature Spikes Noted: No Respiratory Status: Room air History of Recent Intubation: No Behavior/Cognition: Alert;Cooperative;Pleasant mood Oral Cavity Assessment: Within Functional Limits Oral Care Completed by SLP: No Oral Cavity - Dentition: Adequate natural dentition;Missing dentition Vision: Functional for self-feeding Self-Feeding Abilities: Able to feed self Patient Positioning: Upright in bed (EOB) Baseline Vocal Quality: Normal Volitional Cough: Strong Volitional Swallow: Able to elicit    Oral/Motor/Sensory Function Overall Oral Motor/Sensory Function: Within functional limits   Ice Chips Ice chips: Not tested   Thin Liquid Thin Liquid: Within functional limits Presentation: Cup;Self Fed;Straw    Nectar Thick Nectar Thick Liquid: Not tested   Honey Thick Honey Thick Liquid: Not tested   Puree Puree: Within functional limits Presentation: Self Fed;Spoon   Solid  Solid: Within functional limits Presentation: Self Fed      Osie Bond., M.A. [place] Pager [phone] Office [phone]  12/13/2020,2:28 PM

## 2020-12-13 NOTE — Progress Notes (Signed)
Progress Note  Patient Name: Shane [name] Date of Encounter: 12/13/2020  Primary Cardiologist: Kirk Ruths, MD   Subjective   No chest pain or shortness of breath.  Denies fevers  Inpatient Medications    Scheduled Meds: . aspirin EC  81 mg Oral Daily  . atorvastatin  40 mg Oral Daily  . clopidogrel  75 mg Oral Q breakfast  . furosemide  40 mg Oral Daily  . insulin aspart  0-9 Units Subcutaneous TID WC  . insulin aspart  7 Units Subcutaneous TID WC  . insulin detemir  15 Units Subcutaneous QHS  . metoprolol tartrate  25 mg Oral BID  . multivitamin with minerals  1 tablet Oral Daily  . pantoprazole  40 mg Oral Daily  . Ensure Max Protein  11 oz Oral BID  . sodium chloride flush  3 mL Intravenous Q12H   Continuous Infusions: . sodium chloride    .  ceFAZolin (ANCEF) IV 2 g (12/13/20 8413)  . heparin 1,600 Units/hr (12/13/20 0823)   PRN Meds: sodium chloride, acetaminophen, albuterol, hydrALAZINE, ketotifen, labetalol, LORazepam, ondansetron (ZOFRAN) IV, polyethylene glycol, prochlorperazine, sodium chloride flush   Vital Signs    Vitals:   12/13/20 0510 12/13/20 0526 12/13/20 0820 12/13/20 0838  BP:  139/61 122/67   Pulse:  69 63   Resp:  20 20   Temp:  99 F (37.2 C) 98.4 F (36.9 C)   TempSrc:  Oral Oral   SpO2:  97% 98% 99%  Weight: 119.2 kg     Height:        Intake/Output Summary (Last 24 hours) at 12/13/2020 0933 Last data filed at 12/13/2020 2440 Gross per 24 hour  Intake 786.55 ml  Output 1525 ml  Net -738.45 ml   Filed Weights   12/11/20 0909 12/12/20 0550 12/13/20 0510  Weight: 119.2 kg 122.1 kg 119.2 kg    Telemetry    Atrial fibrillation/flutter with a controlled ventricular response- Personally Reviewed  ECG    None- Personally Reviewed  Physical Exam   GEN:  Morbidly obese, no acute distress.   Neck: No JVD Cardiac: IRRR, no murmurs, rubs, or gallops.  Respiratory: Clear to auscultation bilaterally. GI: Soft, nontender,  non-distended  MS: No edema; No deformity. Neuro:  Nonfocal  Psych: Normal affect   Labs    Chemistry Recent Labs  Lab 12/08/20 1805 12/09/20 0304 12/10/20 0354 12/11/20 0436 12/12/20 0203  NA 131* 129* 131* 129* 134*  K 4.5 4.6 4.2 3.9 4.2  CL 92* 93* 96* 99 102  CO2 26 24 25  21* 23  GLUCOSE 143* 153* 70 114* 162*  BUN 37* 37* 31* 28* 31*  CREATININE 1.58* 1.75* 1.78* 1.55* 1.52*  CALCIUM 9.3 8.4* 7.9* 7.4* 7.8*  PROT 7.3 6.5  --   --   --   ALBUMIN 3.0* 2.5*  --   --   --   AST 20 19  --   --   --   ALT 19 17  --   --   --   ALKPHOS 90 80  --   --   --   BILITOT 1.2 1.0  --   --   --   GFRNONAA 44* 39* 38* 45* 46*  ANIONGAP 13 12 10 9 9      Hematology Recent Labs  Lab 12/11/20 0436 12/12/20 0203 12/13/20 0145  WBC 10.4 9.1 9.0  RBC 2.97* 2.93* 2.89*  HGB 9.1* 9.3* 8.8*  HCT 28.1* 27.5* 27.1*  MCV 94.6 93.9 93.8  MCH 30.6 31.7 30.4  MCHC 32.4 33.8 32.5  RDW 13.1 13.2 13.2  PLT 174 168 160    Cardiac EnzymesNo results for input(s): TROPONINI in the last 168 hours. No results for input(s): TROPIPOC in the last 168 hours.   BNP Recent Labs  Lab 12/08/20 1805  BNP 195.4*     DDimer No results for input(s): DDIMER in the last 168 hours.   Radiology    ECHO TEE  Result Date: 12/11/2020    TRANSESOPHOGEAL ECHO REPORT   Patient Name:   Shane [name] Date of Exam: 12/11/2020 Medical Rec #:  [id number]        Height:       72.0 in Accession #:    [id number]       Weight:       262.8 lb Date of Birth:  09-15-1941        BSA:          2.393 m Patient Age:    59 years         BP:           114/54 mmHg Patient Gender: M                HR:           83 bpm. Exam Location:  Inpatient Procedure: 2D Echo, 3D Echo, Transesophageal Echo and Color Doppler Indications:     Bacteremia  History:         Patient has prior history of Echocardiogram examinations, most                  recent 03/29/2020. Risk Factors:Sleep Apnea.  Sonographer:     Clayton Lefort RDCS (AE) Referring  Phys:  [id number] [place] Diagnosing Phys: Eleonore Chiquito MD PROCEDURE: After discussion of the risks and benefits of a TEE, an informed consent was obtained from the patient. TEE procedure time was 33 minutes. The transesophogeal probe was passed without difficulty through the esophogus of the patient. Imaged were obtained with the patient in a left lateral decubitus position. Local oropharyngeal anesthetic was provided with Cetacaine. Sedation performed by different physician. The patient was monitored while under deep sedation. Anesthestetic sedation was provided intravenously by Anesthesiology: 587mg  of Propofol, 60mg  of Lidocaine. Image quality was excellent. The patient's vital signs; including heart rate, blood pressure, and oxygen saturation; remained stable throughout the procedure. The patient developed no complications during the procedure. IMPRESSIONS  1. There is small fibrous stranding attached to the leaflet tips of the aortic valve that could be related to valve degeneration. 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 In the setting of bacteremia, cannot exclude endocarditis. The stranding is small (0.5 cm x 0.2 cm) and there is no significant regurgitaiton of the aortic valve.  2. There is no vegetation seen on the pacemaker leads.  3. The aortic valve is trileaflet with moderate calcification and moderately reduced motion. 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 There is mild aortic stenosis. Vmax 2.2 m/s, MG 12.5 mmHG, AVA 1.65 cm2. The aortic valve is tricuspid. There is moderate calcification of the aortic valve. There is moderate thickening of the aortic valve. Aortic valve regurgitation is not visualized. Mild aortic valve stenosis.  4. Left ventricular ejection fraction, by estimation, is 50 to 55%. The left ventricle has low normal function.  5. Right ventricular systolic function is moderately reduced. The right ventricular size is moderately enlarged.  6. Left atrial  size was  mildly dilated. No left atrial/left atrial appendage thrombus was detected. The LAA emptying velocity was 49 cm/s.  7. The mitral valve is grossly normal. Mild mitral valve regurgitation. No evidence of mitral stenosis.  8. Moderate to severe tricuspid regurgitation is present. 2D PISA 0.9 cm. 2D VC 0.5 cm, 2D ERO 0.44 cm2, R vol 36 cc. The tricuspid valve is abnormal. Tricuspid valve regurgitation is moderate to severe. FINDINGS  Left Ventricle: Left ventricular ejection fraction, by estimation, is 50 to 55%. The left ventricle has low normal function. The left ventricular internal cavity size was normal in size. Right Ventricle: The right ventricular size is moderately enlarged. No increase in right ventricular wall thickness. Right ventricular systolic function is moderately reduced. Left Atrium: Left atrial size was mildly dilated. No left atrial/left atrial appendage thrombus was detected. The LAA emptying velocity was 49 cm/s. Right Atrium: Right atrial size was normal in size. Pericardium: There is no evidence of pericardial effusion. Mitral Valve: The mitral valve is grossly normal. Mild mitral valve regurgitation. No evidence of mitral valve stenosis. There is no evidence of mitral valve vegetation. Tricuspid Valve: Moderate to severe tricuspid regurgitation is present. 2D PISA 0.9 cm. 2D VC 0.5 cm, 2D ERO 0.44 cm2, R vol 36 cc. The tricuspid valve is abnormal. Tricuspid valve regurgitation is moderate to severe. No evidence of tricuspid stenosis. There is no evidence of tricuspid valve vegetation. Aortic Valve: The aortic valve is trileaflet with moderate calcification and moderately reduced motion. 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 There is mild aortic stenosis. Vmax 2.2 m/s, MG 12.5 mmHG, AVA 1.65 cm2. The aortic valve is tricuspid. There is moderate calcification of the aortic valve. There is moderate thickening of the aortic valve. Aortic valve regurgitation is not visualized. Mild aortic  stenosis is present. Aortic valve mean gradient measures 12.5 mmHg. Aortic valve peak gradient measures 19.9 mmHg. Aortic valve area, by VTI measures 1.65 cm. Pulmonic Valve: The pulmonic valve was grossly normal. Pulmonic valve regurgitation is trivial. No evidence of pulmonic stenosis. There is no evidence of pulmonic valve vegetation. Aorta: The aortic root is normal in size and structure. Venous: The right upper pulmonary vein, right lower pulmonary vein, left lower pulmonary vein and left upper pulmonary vein are normal. IAS/Shunts: The interatrial septum appears to be lipomatous. No atrial level shunt detected by color flow Doppler. Additional Comments: A pacer wire is visualized in the right atrium and right ventricle.  LEFT VENTRICLE PLAX 2D LVOT diam:     2.45 cm LV SV:         69 LV SV Index:   29 LVOT Area:     4.71 cm  AORTIC VALVE AV Area (Vmax):    1.89 cm AV Area (Vmean):   1.70 cm AV Area (VTI):     1.65 cm AV Vmax:           223.00 cm/s AV Vmean:          166.000 cm/s AV VTI:            0.420 m AV Peak Grad:      19.9 mmHg AV Mean Grad:      12.5 mmHg LVOT Vmax:         89.40 cm/s LVOT Vmean:        59.900 cm/s LVOT VTI:          0.147 m LVOT/AV VTI ratio: 0.35  AORTA Ao Root diam: 3.60 cm Ao Asc diam:  3.90 cm TRICUSPID VALVE TR Peak grad:   47.9 mmHg TR Mean grad:   22.0 mmHg TR Vmax:        346.00 cm/s TR Vmean:       208.0 cm/s  SHUNTS Systemic VTI:  0.15 m Systemic Diam: 2.45 cm Eleonore Chiquito MD Electronically signed by Eleonore Chiquito MD Signature Date/Time: 12/11/2020/2:01:33 PM    Final     Cardiac Studies   TEE results noted  Patient Profile     80 y.o. male admitted with sepsis and found to have staph bacteremia  Assessment & Plan    1.  Staph bacteremia -he appears to be much improved with intravenous antibiotics 2.  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  Plan extraction of his permanent pacemaker tomorrow 3.  Anticoagulation -his heparin will  need to be held a couple of hours before his procedure. 4.  Atrial fibrillation/flutter -his ventricular rate appears to be controlled.     For questions or updates, please contact [place] Please consult [email or web address] for contact info under Cardiology/STEMI.      Signed, Cristopher Peru, MD  12/13/2020, 9:33 AM  Patient ID: Shane [name], male   DOB: August 04, 1941, 80 y.o.   MRN: [id number]

## 2020-12-13 NOTE — Progress Notes (Signed)
ANTICOAGULATION CONSULT NOTE - Follow Up Consult  Pharmacy Consult for Apixaban to Heparin Indication: atrial fibrillation  Allergies  Allergen Reactions  . Codeine Hives and Itching  . Ofev [Nintedanib] Diarrhea    SEVERE DIARRHEA  . Other Diarrhea and Other (See Comments)    Esbriet causes elevated LFTs. D/C on 06/14/17 and SEVERE DIARRHEA    Patient Measurements: Height: 6' (182.9 cm) Weight: 119.2 kg (262 lb 12.6 oz) IBW/kg (Calculated) : 77.6 Heparin Dosing Weight: 104.5 kg  Vital Signs: Temp: 97.4 F (36.3 C) (01/09 1702) Temp Source: Oral (01/09 1702) BP: 109/59 (01/09 1702) Pulse Rate: 89 (01/09 1702)  Labs: Recent Labs    12/11/20 0436 12/11/20 1101 12/11/20 2014 12/12/20 0203 12/12/20 1030 12/13/20 0145 12/13/20 1605  HGB 9.1*  --   --  9.3*  --  8.8*  --   HCT 28.1*  --   --  27.5*  --  27.1*  --   PLT 174  --   --  168  --  160  --   APTT  --  57*   < > 72* 78* 54* 52*  HEPARINUNFRC  --  1.64*  --  0.96*  --  0.48  --   CREATININE 1.55*  --   --  1.52*  --   --   --    < > = values in this interval not displayed.    Estimated Creatinine Clearance: 52.5 mL/min (A) (by C-G formula based on SCr of 1.52 mg/dL (H)).  Assessment: 80 year old male with hx of atrial fibrillation to begin heparin after vascular procedure today; last dose of apixaban was on 1/5 PM. Given recent apixaban exposure, will monitor anticoagulation using aPTT until aPTT and heparin levels correlate.  APTT continues to be subtherapeutic at 52 seconds on gtt at 1600 units/hr. CBC stable. No issues with line and no bleeding per RN.   Goal of Therapy:  aPTT 66-102 seconds seconds Monitor platelets by anticoagulation protocol: Yes  Heparin level 0.3-0.7 units/ml   Plan:  Increase heparin to 1900 units/hr 8 hour aPTT Daily aPTT, heparin level, CBC  Erin Hearing PharmD., BCPS Clinical Pharmacist 12/13/2020 6:31 PM  Please check [email or web address] for unit-specific pharmacy phone  numbers.

## 2020-12-13 NOTE — Progress Notes (Signed)
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  Patient came to ER with severe weakness.  Admitted with new onset A-flutter, generalized weakness and malaise.  Right foot wound.  Ultimately found to have MSSA bacteremia with suspected endocarditis thought to be from right foot wound. Seen and followed by cardiology, orthopedics, vascular surgery and infectious disease. 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 Fib: Rate controlled on metoprolol.  Initially on Eliquis.  Currently on heparin intraoperatively.  Stable.  Followed by cardiology. Planning to go back on Eliquis after pacemaker extraction.  Generalized weakness and fatigue: Multifactorial.  Dehydration.  Infection.  AKI.  Work with PT OT.  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  No aortic regurgitation. ID recommended removal of pacemaker, scheduled for 1/10. CT scan of the right foot wound with no evidence of abscess or osteomyelitis.  Orthopedics recommended conservative management. Status post aortogram, right lower extremity angiogram, right superficial femoral and above-knee popliteal drug-eluting stent placement.  Currently on aspirin, Plavix and Eliquis that is replaced by heparin. Followed by surgery.  Type 2 diabetes with renal complications, peripheral neuropathy and hypoglycemia: Blood sugars better now on reduced dose of insulin.  Continue current doses.  No more episodes of hypoglycemia.  CAD status post CABG: No anginal symptoms.  Currently remains on aspirin, Lipitor and metoprolol.  OSA on CPAP: Continue CPAP.  AKI with underlying history of stage IIIa chronic kidney disease: Noted baseline creatinine about 1.2-1.5.  Remains at about baseline.  Anemia of chronic disease.  Stable.  Hemoglobin 8.8 today.  Interstitial fibrosis: With risk of pulmonary complications.  Followed by pulmonary.  Continue bronchodilators, incentive spirometry and flutter valve therapy.  Mobilize.  Seen by pulmonary.  Do not think he has any exacerbation of underlying condition.   DVT prophylaxis: SCD's Start: 12/10/20 2311, heparin infusion   Code Status: Full code Family Communication: Wife at the bedside 1/8 Disposition Plan: Status is: Inpatient  Remains inpatient appropriate because:Inpatient level of care appropriate due to severity of illness   Dispo: The patient is from: Home              Anticipated d/c is to: Home              Anticipated d/c  date is: > 3 days              Patient currently is not medically stable to d/c.         Consultants:    Cardiology  Infectious disease  Vascular surgery  Orthopedic  Procedures:   TEE 1/7  Right lower extremity bypass 1/6  Antimicrobials:  Anti-infectives (From admission, onward)   Start     Dose/Rate Route Frequency Ordered Stop   12/09/20 1430  ceFAZolin (ANCEF) IVPB 2g/100 mL premix        2 g 200 mL/hr over 30 Minutes Intravenous Every 8 hours 12/09/20 1418           Subjective: Patient seen and examined.  No overnight events.  He did wear CPAP for 3 hours.  Cough is clearing up.Afebrile.  Telemetry shows rate controlled A. fib.  Objective: Vitals:   12/13/20 0510 12/13/20 0526 12/13/20 0820 12/13/20 0838  BP:  139/61 122/67   Pulse:  69 63   Resp:  20 20   Temp:  99 F (37.2 C) 98.4 F (36.9 C)   TempSrc:  Oral Oral   SpO2:  97% 98% 99%  Weight: 119.2 kg     Height:        Intake/Output Summary (Last 24 hours) at 12/13/2020 1038 Last data filed at 12/13/2020 0611 Gross per 24 hour  Intake 786.55 ml  Output 1350 ml  Net -563.45 ml   Filed Weights   12/11/20 0909 12/12/20 0550 12/13/20 0510  Weight: 119.2 kg 122.1 kg 119.2 kg    Examination:  General exam: Appears calm and comfortable  Currently adequate on room air. Comfortable at rest.  Able to take deep breaths. Respiratory system: Mostly clear.  No added sounds. Cardiovascular system: S1 & S2 heard, RRR.  Pacemaker in place.  Nontender.  Gastrointestinal system: Soft.  Nontender.  Bowel sounds present.  Obese and pendulous. Central nervous system: Alert and oriented. No focal neurological deficits. Extremities: Symmetric 5 x 5 power. Skin:  Sternotomy wound is healing. He has 2 small wound on his buttock, pictures in the chart Right foot lateral wound, erythematous and swollen, minimal drainage present.  Pictures in the chart.          Data Reviewed: I have personally reviewed following labs and imaging studies  CBC: Recent Labs  Lab 12/08/20 1805 12/09/20 0304 12/10/20 0354  12/11/20 0436 12/12/20 0203 12/13/20 0145  WBC 10.2 8.2 11.9* 10.4 9.1 9.0  NEUTROABS 7.3 5.4  --   --   --   --   HGB 11.9* 10.7* 9.6* 9.1* 9.3* 8.8*  HCT 35.8* 32.4* 28.4* 28.1* 27.5* 27.1*  MCV 95.0 94.5 93.4 94.6 93.9 93.8  PLT 257 218 198 174 168 829   Basic Metabolic Panel: Recent Labs  Lab 12/08/20 1805 12/08/20 2045 12/09/20 0304 12/10/20 0354 12/11/20 0436 12/12/20 0203  NA 131*  --  129* 131* 129* 134*  K 4.5  --  4.6 4.2 3.9 4.2  CL 92*  --  93* 96* 99 102  CO2 26  --  24 25 21* 23  GLUCOSE 143*  --  153* 70 114* 162*  BUN 37*  --  37* 31* 28* 31*  CREATININE 1.58*  --  1.75* 1.78* 1.55* 1.52*  CALCIUM 9.3  --  8.4* 7.9* 7.4* 7.8*  MG  --  1.7 1.9  --   --   --    GFR: Estimated Creatinine Clearance: 52.5 mL/min (A) (  by C-G formula based on SCr of 1.52 mg/dL (H)). Liver Function Tests: Recent Labs  Lab 12/08/20 1805 12/09/20 0304  AST 20 19  ALT 19 17  ALKPHOS 90 80  BILITOT 1.2 1.0  PROT 7.3 6.5  ALBUMIN 3.0* 2.5*   No results for input(s): LIPASE, AMYLASE in the last 168 hours. No results for input(s): AMMONIA in the last 168 hours. Coagulation Profile: No results for input(s): INR, PROTIME in the last 168 hours. Cardiac Enzymes: No results for input(s): CKTOTAL, CKMB, CKMBINDEX, TROPONINI in the last 168 hours. BNP (last 3 results) No results for input(s): PROBNP in the last 8760 hours. HbA1C: No results for input(s): HGBA1C in the last 72 hours. CBG: Recent Labs  Lab 12/12/20 1635 12/12/20 2033 12/13/20 0003 12/13/20 0354 12/13/20 0723  GLUCAP 128* 169* 122* 113* 119*   Lipid Profile: No results for input(s): CHOL, HDL, LDLCALC, TRIG, CHOLHDL, LDLDIRECT in the last 72 hours. Thyroid Function Tests: No results for input(s): TSH, T4TOTAL, FREET4, T3FREE, THYROIDAB in the last 72 hours. Anemia Panel: No results for input(s): VITAMINB12, FOLATE, FERRITIN, TIBC, IRON, RETICCTPCT in the last 72 hours. Sepsis Labs: Recent Labs  Lab  12/08/20 1805 12/08/20 2045  LATICACIDVEN 2.1* 1.5    Recent Results (from the past 240 hour(s))  Blood culture (routine x 2)     Status: Abnormal   Collection Time: 12/08/20  6:05 PM   Specimen: BLOOD  Result Value Ref Range Status   Specimen Description BLOOD RIGHT ANTECUBITAL  Final   Special Requests   Final    BOTTLES DRAWN AEROBIC AND ANAEROBIC Blood Culture adequate volume   Culture  Setup Time   Final    GRAM POSITIVE COCCI IN CLUSTERS IN BOTH AEROBIC AND ANAEROBIC BOTTLES Organism ID to follow CRITICAL RESULT CALLED TO, READ BACK BY AND VERIFIED WITH: A WOLFE PHARMD 1407 12/09/20 A BROWNING Performed at [facility], [place] [address] [place], [place] [address]    Culture STAPHYLOCOCCUS AUREUS (A)  Final   Report Status 12/12/2020 FINAL  Final   Organism ID, Bacteria STAPHYLOCOCCUS AUREUS  Final      Susceptibility   Staphylococcus aureus - MIC*    CIPROFLOXACIN <=0.5 SENSITIVE Sensitive     ERYTHROMYCIN <=0.25 SENSITIVE Sensitive     GENTAMICIN <=0.5 SENSITIVE Sensitive     OXACILLIN 0.5 SENSITIVE Sensitive     TETRACYCLINE <=1 SENSITIVE Sensitive     VANCOMYCIN 1 SENSITIVE Sensitive     TRIMETH/SULFA <=10 SENSITIVE Sensitive     CLINDAMYCIN <=0.25 SENSITIVE Sensitive     RIFAMPIN <=0.5 SENSITIVE Sensitive     Inducible Clindamycin NEGATIVE Sensitive     * STAPHYLOCOCCUS AUREUS  Blood culture (routine x 2)     Status: Abnormal   Collection Time: 12/08/20  6:05 PM   Specimen: BLOOD  Result Value Ref Range Status   Specimen Description BLOOD LEFT ANTECUBITAL  Final   Special Requests   Final    BOTTLES DRAWN AEROBIC AND ANAEROBIC Blood Culture adequate volume   Culture  Setup Time   Final    GRAM POSITIVE COCCI IN CLUSTERS IN BOTH AEROBIC AND ANAEROBIC BOTTLES CRITICAL VALUE NOTED.  VALUE IS CONSISTENT WITH PREVIOUSLY REPORTED AND CALLED VALUE.    Culture (A)  Final    STAPHYLOCOCCUS AUREUS SUSCEPTIBILITIES PERFORMED ON PREVIOUS CULTURE WITHIN THE LAST 5  DAYS. Performed at [facility], [place] [address] [place], [place] [address]    Report Status 12/12/2020 FINAL  Final  Resp Panel by RT-PCR (Flu A&B, Covid) Nasopharyngeal Swab     Status: None   Collection Time: 12/08/20  6:05 PM   Specimen: Nasopharyngeal Swab; Nasopharyngeal(NP) swabs in vial transport medium  Result Value Ref Range Status   SARS Coronavirus 2 by RT PCR NEGATIVE NEGATIVE Final    Comment: (NOTE) SARS-CoV-2 target nucleic acids are NOT DETECTED.  The SARS-CoV-2 RNA is generally detectable in upper respiratory specimens during the acute phase of infection. The lowest concentration of SARS-CoV-2 viral copies this assay can detect is 138 copies/mL. A negative result does not preclude SARS-Cov-2 infection and should not be used as the sole basis for treatment or other patient management decisions. A negative result may occur with  improper specimen collection/handling, submission of specimen other than nasopharyngeal swab, presence of viral mutation(s) within the areas targeted by this assay, and inadequate number of viral copies(<138 copies/mL). A negative result must be combined with clinical observations, patient history, and epidemiological information. The expected result is Negative.  Fact Sheet for Patients:  EntrepreneurPulse.com.au  Fact Sheet for Healthcare Providers:  IncredibleEmployment.be  This test is no t yet approved or cleared by the [place] FDA and  has been authorized for detection and/or diagnosis of SARS-CoV-2 by FDA under an Emergency Use Authorization (EUA). This EUA will remain  in effect (meaning this test can be used) for the duration of the COVID-19 declaration under Section 564(b)(1) of the Act, 21 U.S.C.section 360bbb-3(b)(1), unless the authorization is terminated  or revoked sooner.       Influenza A by PCR NEGATIVE NEGATIVE Final   Influenza B by PCR NEGATIVE NEGATIVE Final     Comment: (NOTE) The Xpert Xpress SARS-CoV-2/FLU/RSV plus assay is intended as an aid in the diagnosis of influenza from Nasopharyngeal swab specimens and should not be used as a sole basis for treatment. Nasal washings and aspirates are unacceptable for Xpert Xpress SARS-CoV-2/FLU/RSV testing.  Fact Sheet for Patients: EntrepreneurPulse.com.au  Fact Sheet for Healthcare Providers: IncredibleEmployment.be  This test is not yet approved or cleared by the [place] FDA and has been authorized for detection and/or diagnosis of SARS-CoV-2 by FDA under an Emergency Use Authorization (EUA). This EUA will remain in effect (meaning this test can be used) for the duration of the COVID-19 declaration under Section 564(b)(1) of the Act, 21 U.S.C. section 360bbb-3(b)(1), unless the authorization is terminated or revoked.  Performed at [facility], [place] [address] [place], [place] [address]   Blood Culture ID Panel (Reflexed)     Status: Abnormal   Collection Time: 12/08/20  6:05 PM  Result Value Ref Range Status   Enterococcus faecalis NOT DETECTED NOT DETECTED Final   Enterococcus Faecium NOT DETECTED NOT DETECTED Final   Listeria monocytogenes NOT DETECTED NOT DETECTED Final   Staphylococcus species DETECTED (A) NOT DETECTED Final    Comment: CRITICAL RESULT CALLED TO, READ BACK BY AND VERIFIED WITH: A WOLFE PHARMD 1407 12/09/20 A BROWNING    Staphylococcus aureus (BCID) DETECTED (A) NOT DETECTED Final    Comment: CRITICAL RESULT CALLED TO, READ BACK BY AND VERIFIED WITH: A WOLFE PHARMD 1407 12/09/20 A BROWNING    Staphylococcus epidermidis NOT DETECTED NOT DETECTED Final   Staphylococcus lugdunensis NOT DETECTED NOT DETECTED Final   Streptococcus species NOT DETECTED NOT DETECTED Final   Streptococcus agalactiae NOT DETECTED NOT DETECTED Final   Streptococcus pneumoniae NOT DETECTED NOT DETECTED Final   Streptococcus pyogenes NOT DETECTED NOT  DETECTED Final   A.calcoaceticus-baumannii NOT  DETECTED NOT DETECTED Final   Bacteroides fragilis NOT DETECTED NOT DETECTED Final   Enterobacterales NOT DETECTED NOT DETECTED Final   Enterobacter cloacae complex NOT DETECTED NOT DETECTED Final   Escherichia coli NOT DETECTED NOT DETECTED Final   Klebsiella aerogenes NOT DETECTED NOT DETECTED Final   Klebsiella oxytoca NOT DETECTED NOT DETECTED Final   Klebsiella pneumoniae NOT DETECTED NOT DETECTED Final   Proteus species NOT DETECTED NOT DETECTED Final   Salmonella species NOT DETECTED NOT DETECTED Final   Serratia marcescens NOT DETECTED NOT DETECTED Final   Haemophilus influenzae NOT DETECTED NOT DETECTED Final   Neisseria meningitidis NOT DETECTED NOT DETECTED Final   Pseudomonas aeruginosa NOT DETECTED NOT DETECTED Final   Stenotrophomonas maltophilia NOT DETECTED NOT DETECTED Final   Candida albicans NOT DETECTED NOT DETECTED Final   Candida auris NOT DETECTED NOT DETECTED Final   Candida glabrata NOT DETECTED NOT DETECTED Final   Candida krusei NOT DETECTED NOT DETECTED Final   Candida parapsilosis NOT DETECTED NOT DETECTED Final   Candida tropicalis NOT DETECTED NOT DETECTED Final   Cryptococcus neoformans/gattii NOT DETECTED NOT DETECTED Final   Meth resistant mecA/C and MREJ NOT DETECTED NOT DETECTED Final    Comment: Performed at [facility], [address] [place], [place] [address]  Culture, blood (routine x 2)     Status: None (Preliminary result)   Collection Time: 12/11/20  4:36 AM   Specimen: BLOOD LEFT HAND  Result Value Ref Range Status   Specimen Description BLOOD LEFT HAND  Final   Special Requests   Final    BOTTLES DRAWN AEROBIC AND ANAEROBIC Blood Culture adequate volume   Culture   Final    NO GROWTH 1 DAY Performed at [facility], [place] [address] [place], [place] [address]    Report Status PENDING  Incomplete  Culture, blood (routine x 2)     Status: None (Preliminary result)   Collection  Time: 12/11/20  4:37 AM   Specimen: BLOOD RIGHT HAND  Result Value Ref Range Status   Specimen Description BLOOD RIGHT HAND  Final   Special Requests   Final    BOTTLES DRAWN AEROBIC AND ANAEROBIC Blood Culture adequate volume   Culture   Final    NO GROWTH 1 DAY Performed at [facility], [place] [address], [place],  [address]    Report Status PENDING  Incomplete         Radiology Studies: ECHO TEE  Result Date: 12/11/2020    TRANSESOPHOGEAL ECHO REPORT   Patient Name:   Shane [name] Date of Exam: 12/11/2020 Medical Rec #:  [id number]        Height:       72.0 in Accession #:    [id number]       Weight:       262.8 lb Date of Birth:  13-Jun-1941        BSA:          2.393 m Patient Age:    34 years         BP:           114/54 mmHg Patient Gender: M                HR:           83 bpm. Exam Location:  Inpatient Procedure: 2D Echo, 3D Echo, Transesophageal Echo and Color Doppler Indications:     Bacteremia  History:  Patient has prior history of Echocardiogram examinations, most                  recent 03/29/2020. Risk Factors:Sleep Apnea.  Sonographer:     Clayton Lefort RDCS (AE) Referring Phys:  [id number] [place] Diagnosing Phys: Eleonore Chiquito MD PROCEDURE: After discussion of the risks and benefits of a TEE, an informed consent was obtained from the patient. TEE procedure time was 33 minutes. The transesophogeal probe was passed without difficulty through the esophogus of the patient. Imaged were obtained with the patient in a left lateral decubitus position. Local oropharyngeal anesthetic was provided with Cetacaine. Sedation performed by different physician. The patient was monitored while under deep sedation. Anesthestetic sedation was provided intravenously by Anesthesiology: 587mg  of Propofol, 60mg  of Lidocaine. Image quality was excellent. The patient's vital signs; including heart rate, blood pressure, and oxygen saturation; remained stable throughout the procedure.  The patient developed no complications during the procedure. IMPRESSIONS  1. There is small fibrous stranding attached to the leaflet tips of the aortic valve that could be related to valve degeneration. 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 In the setting of bacteremia, cannot exclude endocarditis. The stranding is small (0.5 cm x 0.2 cm) and there is no significant regurgitaiton of the aortic valve.  2. There is no vegetation seen on the pacemaker leads.  3. The aortic valve is trileaflet with moderate calcification and moderately reduced motion. 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 There is mild aortic stenosis. Vmax 2.2 m/s, MG 12.5 mmHG, AVA 1.65 cm2. The aortic valve is tricuspid. There is moderate calcification of the aortic valve. There is moderate thickening of the aortic valve. Aortic valve regurgitation is not visualized. Mild aortic valve stenosis.  4. Left ventricular ejection fraction, by estimation, is 50 to 55%. The left ventricle has low normal function.  5. Right ventricular systolic function is moderately reduced. The right ventricular size is moderately enlarged.  6. Left atrial size was mildly dilated. No left atrial/left atrial appendage thrombus was detected. The LAA emptying velocity was 49 cm/s.  7. The mitral valve is grossly normal. Mild mitral valve regurgitation. No evidence of mitral stenosis.  8. Moderate to severe tricuspid regurgitation is present. 2D PISA 0.9 cm. 2D VC 0.5 cm, 2D ERO 0.44 cm2, R vol 36 cc. The tricuspid valve is abnormal. Tricuspid valve regurgitation is moderate to severe. FINDINGS  Left Ventricle: Left ventricular ejection fraction, by estimation, is 50 to 55%. The left ventricle has low normal function. The left ventricular internal cavity size was normal in size. Right Ventricle: The right ventricular size is moderately enlarged. No increase in right ventricular wall thickness. Right ventricular systolic function is moderately reduced. Left Atrium:  Left atrial size was mildly dilated. No left atrial/left atrial appendage thrombus was detected. The LAA emptying velocity was 49 cm/s. Right Atrium: Right atrial size was normal in size. Pericardium: There is no evidence of pericardial effusion. Mitral Valve: The mitral valve is grossly normal. Mild mitral valve regurgitation. No evidence of mitral valve stenosis. There is no evidence of mitral valve vegetation. Tricuspid Valve: Moderate to severe tricuspid regurgitation is present. 2D PISA 0.9 cm. 2D VC 0.5 cm, 2D ERO 0.44 cm2, R vol 36 cc. The tricuspid valve is abnormal. Tricuspid valve regurgitation is moderate to severe. No evidence of tricuspid stenosis. There is no evidence of tricuspid valve vegetation. Aortic Valve: The aortic valve is trileaflet with moderate calcification and moderately reduced motion. There is partial  fusion of the RCC/LCC. There is mild aortic stenosis. Vmax 2.2 m/s, MG 12.5 mmHG, AVA 1.65 cm2. The aortic valve is tricuspid. There is moderate calcification of the aortic valve. There is moderate thickening of the aortic valve. Aortic valve regurgitation is not visualized. Mild aortic stenosis is present. Aortic valve mean gradient measures 12.5 mmHg. Aortic valve peak gradient measures 19.9 mmHg. Aortic valve area, by VTI measures 1.65 cm. Pulmonic Valve: The pulmonic valve was grossly normal. Pulmonic valve regurgitation is trivial. No evidence of pulmonic stenosis. There is no evidence of pulmonic valve vegetation. Aorta: The aortic root is normal in size and structure. Venous: The right upper pulmonary vein, right lower pulmonary vein, left lower pulmonary vein and left upper pulmonary vein are normal. IAS/Shunts: The interatrial septum appears to be lipomatous. No atrial level shunt detected by color flow Doppler. Additional Comments: A pacer wire is visualized in the right atrium and right ventricle.  LEFT VENTRICLE PLAX 2D LVOT diam:     2.45 cm LV SV:         69 LV SV Index:    29 LVOT Area:     4.71 cm  AORTIC VALVE AV Area (Vmax):    1.89 cm AV Area (Vmean):   1.70 cm AV Area (VTI):     1.65 cm AV Vmax:           223.00 cm/s AV Vmean:          166.000 cm/s AV VTI:            0.420 m AV Peak Grad:      19.9 mmHg AV Mean Grad:      12.5 mmHg LVOT Vmax:         89.40 cm/s LVOT Vmean:        59.900 cm/s LVOT VTI:          0.147 m LVOT/AV VTI ratio: 0.35  AORTA Ao Root diam: 3.60 cm Ao Asc diam:  3.90 cm TRICUSPID VALVE TR Peak grad:   47.9 mmHg TR Mean grad:   22.0 mmHg TR Vmax:        346.00 cm/s TR Vmean:       208.0 cm/s  SHUNTS Systemic VTI:  0.15 m Systemic Diam: 2.45 cm Eleonore Chiquito MD Electronically signed by Eleonore Chiquito MD Signature Date/Time: 12/11/2020/2:01:33 PM    Final         Scheduled Meds: . aspirin EC  81 mg Oral Daily  . atorvastatin  40 mg Oral Daily  . clopidogrel  75 mg Oral Q breakfast  . furosemide  40 mg Oral Daily  . insulin aspart  0-9 Units Subcutaneous TID WC  . insulin aspart  7 Units Subcutaneous TID WC  . insulin detemir  15 Units Subcutaneous QHS  . metoprolol tartrate  25 mg Oral BID  . multivitamin with minerals  1 tablet Oral Daily  . pantoprazole  40 mg Oral Daily  . Ensure Max Protein  11 oz Oral BID  . sodium chloride flush  3 mL Intravenous Q12H   Continuous Infusions: . sodium chloride    .  ceFAZolin (ANCEF) IV 2 g (12/13/20 6754)  . heparin 1,600 Units/hr (12/13/20 0823)     LOS: 4 days    Time spent: 30 minutes    Barb Merino, MD [facility] Pager [phone]

## 2020-12-13 NOTE — Progress Notes (Signed)
ANTICOAGULATION CONSULT NOTE - Follow Up Consult  Pharmacy Consult for Apixaban to Heparin Indication: atrial fibrillation  Allergies  Allergen Reactions  . Codeine Hives and Itching  . Ofev [Nintedanib] Diarrhea    SEVERE DIARRHEA  . Other Diarrhea and Other (See Comments)    Esbriet causes elevated LFTs. D/C on 06/14/17 and SEVERE DIARRHEA    Patient Measurements: Height: 6' (182.9 cm) Weight: 119.2 kg (262 lb 12.6 oz) IBW/kg (Calculated) : 77.6 Heparin Dosing Weight: 104.5 kg  Vital Signs: Temp: 99 F (37.2 C) (01/09 0526) Temp Source: Oral (01/09 0526) BP: 139/61 (01/09 0526) Pulse Rate: 69 (01/09 0526)  Labs: Recent Labs    12/11/20 0436 12/11/20 1101 12/11/20 2014 12/12/20 0203 12/12/20 1030 12/13/20 0145  HGB 9.1*  --   --  9.3*  --  8.8*  HCT 28.1*  --   --  27.5*  --  27.1*  PLT 174  --   --  168  --  160  APTT  --  57*   < > 72* 78* 54*  HEPARINUNFRC  --  1.64*  --  0.96*  --  0.48  CREATININE 1.55*  --   --  1.52*  --   --    < > = values in this interval not displayed.    Estimated Creatinine Clearance: 52.5 mL/min (A) (by C-G formula based on SCr of 1.52 mg/dL (H)).  Assessment: 80 year old male with hx of atrial fibrillation to begin heparin after vascular procedure today; last dose of apixaban was on 1/5 PM. Given recent apixaban exposure, will monitor anticoagulation using aPTT until aPTT and heparin levels correlate.  aPTT subtherapeutic at 54 seconds on gtt at 1450 units/hr. CBC stable. No issues with line and no bleeding per RN.   Goal of Therapy:  aPTT 66-102 seconds seconds Monitor platelets by anticoagulation protocol: Yes  Heparin level 0.3-0.7 units/ml   Plan:  Increase heparin to 1600 units/hr 8 hour aPTT Daily aPTT, heparin level, CBC   Rebbeca Paul, PharmD PGY1 Pharmacy Resident 12/13/2020 7:17 AM  Please check [email or web address] for unit-specific pharmacy phone numbers.

## 2020-12-14 ENCOUNTER — Inpatient Hospital Stay (HOSPITAL_COMMUNITY)
Admission: EM | Disposition: A | Discharge status: Home Health | Payer: Self-pay | Source: Home / Self Care | Attending: Internal Medicine

## 2020-12-14 ENCOUNTER — Inpatient Hospital Stay: Payer: Self-pay

## 2020-12-14 DIAGNOSIS — I330 Acute and subacute infective endocarditis: Secondary | ICD-10-CM | POA: Diagnosis not present

## 2020-12-14 DIAGNOSIS — Z959 Presence of cardiac and vascular implant and graft, unspecified: Secondary | ICD-10-CM | POA: Diagnosis not present

## 2020-12-14 DIAGNOSIS — T827XXA Infection and inflammatory reaction due to other cardiac and vascular devices, implants and grafts, initial encounter: Secondary | ICD-10-CM

## 2020-12-14 DIAGNOSIS — E13621 Other specified diabetes mellitus with foot ulcer: Secondary | ICD-10-CM | POA: Diagnosis not present

## 2020-12-14 DIAGNOSIS — E11621 Type 2 diabetes mellitus with foot ulcer: Secondary | ICD-10-CM

## 2020-12-14 DIAGNOSIS — R7881 Bacteremia: Secondary | ICD-10-CM | POA: Diagnosis not present

## 2020-12-14 DIAGNOSIS — B9561 Methicillin susceptible Staphylococcus aureus infection as the cause of diseases classified elsewhere: Secondary | ICD-10-CM | POA: Diagnosis not present

## 2020-12-14 HISTORY — PX: PPM GENERATOR REMOVAL: EP1234

## 2020-12-14 LAB — HEPARIN LEVEL (UNFRACTIONATED)
Heparin Unfractionated: 0.46 IU/mL (ref 0.30–0.70)
Heparin Unfractionated: 0.16 IU/mL — ABNORMAL LOW (ref 0.30–0.70)

## 2020-12-14 LAB — GLUCOSE, CAPILLARY
Glucose-Capillary: 100 mg/dL — ABNORMAL HIGH (ref 70–99)
Glucose-Capillary: 103 mg/dL — ABNORMAL HIGH (ref 70–99)
Glucose-Capillary: 115 mg/dL — ABNORMAL HIGH (ref 70–99)
Glucose-Capillary: 125 mg/dL — ABNORMAL HIGH (ref 70–99)
Glucose-Capillary: 153 mg/dL — ABNORMAL HIGH (ref 70–99)
Glucose-Capillary: 177 mg/dL — ABNORMAL HIGH (ref 70–99)
Glucose-Capillary: 187 mg/dL — ABNORMAL HIGH (ref 70–99)
Glucose-Capillary: 192 mg/dL — ABNORMAL HIGH (ref 70–99)

## 2020-12-14 LAB — CBC
HCT: 28 % — ABNORMAL LOW (ref 39.0–52.0)
Hemoglobin: 9.1 g/dL — ABNORMAL LOW (ref 13.0–17.0)
MCH: 30.7 pg (ref 26.0–34.0)
MCHC: 32.5 g/dL (ref 30.0–36.0)
MCV: 94.6 fL (ref 80.0–100.0)
Platelets: 203 10*3/uL (ref 150–400)
RBC: 2.96 MIL/uL — ABNORMAL LOW (ref 4.22–5.81)
RDW: 13.3 % (ref 11.5–15.5)
WBC: 9.4 10*3/uL (ref 4.0–10.5)
nRBC: 0 % (ref 0.0–0.2)

## 2020-12-14 LAB — APTT
aPTT: 80 seconds — ABNORMAL HIGH (ref 24–36)
aPTT: 42 seconds — ABNORMAL HIGH (ref 24–36)

## 2020-12-14 SURGERY — PPM GENERATOR REMOVAL

## 2020-12-14 MED ORDER — HYDROCODONE-HOMATROPINE 5-1.5 MG/5ML PO SYRP
5.0000 mL | ORAL_SOLUTION | Freq: Four times a day (QID) | ORAL | Status: DC | PRN
  Administered 2020-12-14 – 2020-12-16 (×6): 5 mL via ORAL
  Filled 2020-12-14 (×6): qty 5

## 2020-12-14 MED ORDER — LIDOCAINE HCL 1 % IJ SOLN
INTRAMUSCULAR | Status: AC
  Filled 2020-12-14: qty 60

## 2020-12-14 MED ORDER — CEFAZOLIN SODIUM-DEXTROSE 2-4 GM/100ML-% IV SOLN
INTRAVENOUS | Status: AC
  Filled 2020-12-14: qty 100

## 2020-12-14 MED ORDER — FENTANYL CITRATE (PF) 100 MCG/2ML IJ SOLN
INTRAMUSCULAR | Status: AC
  Filled 2020-12-14: qty 2

## 2020-12-14 MED ORDER — FENTANYL CITRATE (PF) 100 MCG/2ML IJ SOLN
INTRAMUSCULAR | Status: DC | PRN
  Administered 2020-12-14: 25 ug via INTRAVENOUS
  Administered 2020-12-14: 12.5 ug via INTRAVENOUS

## 2020-12-14 MED ORDER — ONDANSETRON HCL 4 MG/2ML IJ SOLN
4.0000 mg | Freq: Four times a day (QID) | INTRAMUSCULAR | Status: DC | PRN
  Administered 2020-12-15: 4 mg via INTRAVENOUS
  Filled 2020-12-14 (×2): qty 2

## 2020-12-14 MED ORDER — MIDAZOLAM HCL 5 MG/5ML IJ SOLN
INTRAMUSCULAR | Status: DC | PRN
  Administered 2020-12-14: 2 mg via INTRAVENOUS
  Administered 2020-12-14: 1 mg via INTRAVENOUS

## 2020-12-14 MED ORDER — ACETAMINOPHEN 325 MG PO TABS
325.0000 mg | ORAL_TABLET | ORAL | Status: DC | PRN
  Administered 2020-12-15: 650 mg via ORAL
  Filled 2020-12-14: qty 2

## 2020-12-14 MED ORDER — LIDOCAINE HCL (PF) 1 % IJ SOLN
INTRAMUSCULAR | Status: DC | PRN
  Administered 2020-12-14: 50 mL

## 2020-12-14 MED ORDER — PROMETHAZINE HCL 6.25 MG/5ML PO SYRP
12.5000 mg | ORAL_SOLUTION | Freq: Four times a day (QID) | ORAL | Status: DC | PRN
  Administered 2020-12-14 – 2020-12-16 (×4): 12.5 mg via ORAL
  Filled 2020-12-14 (×5): qty 10

## 2020-12-14 MED ORDER — MIDAZOLAM HCL 5 MG/5ML IJ SOLN
INTRAMUSCULAR | Status: AC
  Filled 2020-12-14: qty 5

## 2020-12-14 SURGICAL SUPPLY — 3 items
MAT PREVALON FULL STRYKER (MISCELLANEOUS) ×1 IMPLANT
PAD PRO RADIOLUCENT 2001M-C (PAD) ×2 IMPLANT
TRAY PACEMAKER INSERTION (PACKS) ×2 IMPLANT

## 2020-12-14 NOTE — Interval H&P Note (Signed)
History and Physical Interval Note:  12/14/2020 4:26 PM  [name]  has presented today for surgery, with the diagnosis of bacteremia.  The various methods of treatment have been discussed with the patient and family. After consideration of risks, benefits and other options for treatment, the patient has consented to  Procedure(s): PPM GENERATOR REMOVAL (N/A) as a surgical intervention.  The patient's history has been reviewed, patient examined, no change in status, stable for surgery.  I have reviewed the patient's chart and labs.  Questions were answered to the patient's satisfaction.     Shane [name]

## 2020-12-14 NOTE — Progress Notes (Signed)
PT Cancellation Note  Patient Details Name: Arn Mcomber MRN: [id number] DOB: September 01, 1941   Cancelled Treatment:    Reason Eval/Treat Not Completed: Other (comment). Pt waiting for procedure and doesn't want to move around too much and set off coughing. Will follow up tomorrow.    Shary Decamp Maycok 12/14/2020, 3:20 PM [facility] Pager [phone] Office [phone]

## 2020-12-14 NOTE — Progress Notes (Signed)
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   He has been found to have MSSA bacteremia and new onset atrial fibrillation/flutter. He is being treated with cefazolin IV per infectious disease. He has had increasing productive cough and has been placed on supplemental oxygen which has not required previously.  Patient reports his cough started 3 days ago and got concerning last night when he was having a difficult time coughing while on his CPAP. He is coughing up whitish yellow mucous. He denies wheezing or chest tightness. He has been started on albuterol nebulizer treatments with improvement in his cough and bringing up the mucous. He has rhinitis but denies post-nasal drainage. He reports increasing cough with eating and drinking which has been an issue in the past as he has had neck surgeries in the past.   He reports he has been walking in the hallway without desaturations below 88%. It is noted in an OT note on 12/11/19 that he maintained an oxygen saturation of 90-95% with activity.  CT Chest 12/08/2020 reviewed, no progression noted of interstitial disease compared to HRCT in 2020. No other new opacities or infiltrates.  TEE 12/11/20 reviewed, he has normal EF with moderate reduction in RV function and mild dilation in RV size.       Past Medical History:  He,  has a past medical history of AV block, Mobitz 1, Cataract, Chronic kidney disease, CKD (chronic kidney disease), stage III ([place]), Diabetes mellitus, Dyspnea, GERD (gastroesophageal reflux disease),  Hyperlipidemia, Hypertension, Idiopathic pulmonary fibrosis ([place]) (11/2016), ILD (interstitial lung disease) ([place]), Moderate aortic stenosis, Neuromuscular disorder ([place]), Nonobstructive CAD (coronary artery disease), OSA on CPAP (05/05/2017), PONV (postoperative nausea and vomiting), and Sleep apnea.   Significant Hospital Events:    Consults:  Cardiology   Procedures:  1/7 TEE   Micro Data:  12/08/20 Blood Cx MSSA 12/11/20 Blood Cx negative  Antimicrobials:  Cefazolin 1/5>>  Interim History / Subjective:  Persistent coughing  Objective   Blood pressure (!) 147/74, pulse 65, temperature 98.2 F (36.8 C), temperature source Oral, resp. rate 18, height 6' (1.829 m), weight 118.8 kg, SpO2 100 %.        Intake/Output Summary (Last 24 hours) at 12/14/2020 1448 Last data filed at 12/14/2020 0800 Gross per 24 hour  Intake 1134.07 ml  Output 575 ml  Net 559.07 ml   Filed Weights   12/12/20 0550 12/13/20 0510 12/14/20 0600  Weight: 122.1 kg 119.2 kg 118.8 kg    Examination: General: Elderly, does not appear to be in distress HENT: Moist oral mucosa Lungs: Rales bilaterally Cardiovascular: S1-S2 appreciated Abdomen: Soft, bowel sounds appreciated Extremities: No clubbing, no edema Neuro: Alert and oriented x3 GU:   Resolved Hospital Problem list     Assessment & Plan:  Idiopathic pulmonary fibrosis cough/dysphagia  obstructive sleep apnea  Continue CPAP therapy -continue antitussives -added Hycodan-this makes him nauseous, added Phenergan  He follows up with Dr. Chase Caller was in a clinical trial, not on any medications at present continue bronchodilators flutter valve and incentive spirometry use   Adewale Olalere,  MD [facility] Pager: [phone]

## 2020-12-14 NOTE — Progress Notes (Addendum)
   VASCULAR SURGERY ASSESSMENT & PLAN:   3 Days Post-angiogram:  Shane [name] is a 80 y.o. male with atherosclerosis of native arteries of right lower extremity causing ulceration. Status post endovascular recanalization of right superficial femoral artery occlusion with drug-eluting stenting. Continue ASA / Eliquis / Statin. Continue local wound care to ulcer - paint with betadine daily. Afebrile. VSS. Serum creatinine trending down: 1.52 on Saturday. H and H stable.  SUBJECTIVE:   No pain. Wife at bedside.  PHYSICAL EXAM:   Vitals:   12/14/20 0047 12/14/20 0049 12/14/20 0441 12/14/20 0600  BP: 120/76  (!) 126/52   Pulse: 66  74   Resp: 18  18   Temp: 98.6 F (37 C)  98.2 F (36.8 C)   TempSrc: Oral  Oral   SpO2: 100% 100% 100%   Weight:    118.8 kg  Height:       Right foot dressing removed and replaced. Xeroform gauze on wound. Unable to express any drainage or purulent material from wound. Left groin is soft with ecchymosis  LABS:   Lab Results  Component Value Date   WBC 9.4 12/14/2020   HGB 9.1 (L) 12/14/2020   HCT 28.0 (L) 12/14/2020   MCV 94.6 12/14/2020   PLT 203 12/14/2020   Lab Results  Component Value Date   CREATININE 1.52 (H) 12/12/2020   Lab Results  Component Value Date   INR 1.0 03/29/2020   CBG (last 3)  Recent Labs    12/13/20 1152 12/13/20 1707 12/13/20 2044  GLUCAP 167* 124* 95    PROBLEM LIST:    Principal Problem:   MSSA bacteremia Active Problems:   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HCC)   OSA on CPAP   Essential hypertension   Chronic kidney disease, stage 3a (HCC)   Coronary artery disease involving native heart without angina pectoris   Cardiac device in situ   Generalized weakness   Dehydration with hyponatremia   Atrial fibrillation, chronic (HCC)   Elevated troponin level not due myocardial infarction   Diabetic foot ulcer (HCC)   CURRENT MEDS:   . aspirin EC  81 mg Oral Daily  . atorvastatin  40 mg Oral  Daily  . chlorhexidine  60 mL Topical Once  . furosemide  40 mg Oral Daily  . gentamicin irrigation  80 mg Irrigation On Call  . insulin aspart  0-9 Units Subcutaneous TID WC  . insulin aspart  7 Units Subcutaneous TID WC  . insulin detemir  15 Units Subcutaneous QHS  . metoprolol tartrate  25 mg Oral BID  . multivitamin with minerals  1 tablet Oral Daily  . pantoprazole  40 mg Oral Daily  . Ensure Max Protein  11 oz Oral BID  . sodium chloride flush  3 mL Intravenous Q12H    Risa Grill, PA-C Office: [phone] 12/14/2020   VASCULAR STAFF ADDENDUM: I have independently interviewed and examined the patient. I agree with the above.  Looks great s/p endovascular R SFA recanalization  Continue ASA / Eliquis / Statin. Follow up with me in 4 weeks with ABI / Duplex. Please call for questions.  [name]. Stanford Breed, MD [facility] Phone Number: [phone] 12/14/2020 2:36 PM

## 2020-12-14 NOTE — Progress Notes (Signed)
Pt stated he was able to put himself on CPAP tonight. Rt will continue to monitor.

## 2020-12-14 NOTE — Care Management Important Message (Signed)
Important Message  Patient Details  Name: Antonios Ostrow MRN: [id number] Date of Birth: March 05, 1941   Medicare Important Message Given:  Yes     Shelda Altes 12/14/2020, 2:08 PM

## 2020-12-14 NOTE — Progress Notes (Signed)
      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  Plan for device extraction today  Peripheral vascular disease - status post intervention with [facility] 12/10/20 in setting of right foot wound   Right foot wound - as above, evaluated by orthopedics earlier in the admission.  Currently no surgical plans.  CT scan without evidence of abscess or osteomyelitis  CKD - creatinine trending down on last blood draw  CAD s/p CABG 11/2020 at [facility]:    - Continue cefazolin 2gm q8h and will plan for treatment course similar to endocarditis - Device removal today.  Would aim for surveillance blood cultures following device removal being negative for at least 14 days prior to reimplantation if needed - Okay to place PICC - Wound care  SUBJECTIVE:   No acute complaints.  Cough improving, no fevers or chills.  Tolerating Rx.  Discussed with him and wife regarding needing several week course of IV antibiotics to treat his infection   OBJECTIVE:   Blood pressure (!) 147/74, pulse 65, temperature 98.2 F (36.8 C), temperature source Oral, resp. rate 18, height 6' (1.829 m), weight 118.8 kg, SpO2 100 %. Body mass index is 35.51 kg/m.  Physical Exam Constitutional:      General: He is not in acute distress.    Appearance: He is well-developed.  HENT:     Head: Normocephalic and atraumatic.  Pulmonary:     Effort: Pulmonary effort is normal. No respiratory distress.  Skin:    General: Skin is warm and dry.     Comments: Well healing CABG scar.  Neurological:     General: No focal deficit present.     Mental Status: He is alert and oriented to person, place, and time.  Psychiatric:        Mood and Affect: Mood normal.        Behavior: Behavior normal.      Lab Results: Lab Results  Component Value Date   WBC 9.4 12/14/2020   HGB 9.1 (L) 12/14/2020   HCT 28.0 (L) 12/14/2020   MCV 94.6 12/14/2020   PLT 203 12/14/2020    Lab Results  Component Value Date   NA 134 (L) 12/12/2020   K 4.2 12/12/2020   CO2 23 12/12/2020   GLUCOSE 162 (H) 12/12/2020   BUN 31 (H) 12/12/2020   CREATININE 1.52 (H) 12/12/2020   CALCIUM 7.8 (L) 12/12/2020   GFRNONAA 46 (L) 12/12/2020   GFRAA 53 (L) 07/17/2020    Lab Results  Component Value Date   ALT 17 12/09/2020   AST 19 12/09/2020   ALKPHOS 80 12/09/2020   BILITOT 1.0 12/09/2020    I have reviewed the micro and lab results in Epic.  Imaging: No results found.    Raynelle Highland for Infectious [facility] [phone] pager 12/14/2020, 9:48 AM

## 2020-12-14 NOTE — Progress Notes (Signed)
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  Patient came to ER with severe weakness.  Admitted with new onset A-flutter, generalized weakness and malaise.  Right foot wound.  Ultimately found to have MSSA bacteremia with suspected endocarditis thought to be from right foot wound. Seen and followed by cardiology, orthopedics, vascular surgery and infectious disease. Right superficial femoral/above-knee popliteal stent on 1/6 TEE on 1/7 with suspected lead endocarditis   Assessment & Plan:   Principal Problem:   MSSA bacteremia Active Problems:   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HCC)   OSA on CPAP   Essential hypertension   Chronic kidney disease, stage 3a (HCC)   Coronary artery disease involving native heart without angina pectoris   Cardiac device in situ   Generalized weakness   Dehydration with hyponatremia   Atrial fibrillation, chronic (HCC)   Elevated troponin level not due myocardial infarction   Diabetic foot ulcer ([place])   Acute bacterial endocarditis  New onset A-flutter/A. Fib: Rate controlled on metoprolol.  Initially on Eliquis.  Currently on heparin intraoperatively.  Stable.  Followed by cardiology. Planning to go back on Eliquis after pacemaker extraction.  Generalized weakness and fatigue: Multifactorial.  Dehydration.  Infection.  AKI.  Work with PT OT.  MSSA bacteremia, likely related to chronic right foot  diabetic wound, endocarditis: 1/4, blood culture positive for MSSA 1/7, blood cultures negative so far Followed by ID, currently remains on cefazolin TEE 1/7, questionable aortic valve endocarditis.  No aortic regurgitation. ID recommended removal of pacemaker, scheduled for today. CT scan of the right foot wound with no evidence of abscess or osteomyelitis.  Surgery recommended conservative management.  Status post aortogram, right lower extremity angiogram, right superficial femoral and above-knee popliteal drug-eluting stent placement.  Currently on aspirin, Plavix and Eliquis that is replaced by heparin. Followed by surgery. PICC line today with anticipation of 6 weeks of antibiotic therapy at home.  Type 2 diabetes with renal complications, peripheral neuropathy and hypoglycemia: Blood sugars better now on reduced dose of insulin.  Continue current doses.  No more episodes of hypoglycemia.  CAD status post CABG: No anginal symptoms.  Currently remains on aspirin, Lipitor and metoprolol.  OSA on CPAP: Continue CPAP.  AKI with underlying history of stage IIIa chronic kidney disease: Noted baseline creatinine about 1.2-1.5.  Remains at about baseline. Recheck renal function tomorrow morning.  Anemia of chronic disease.  Stable.  Hemoglobin 9.1 today.  Interstitial fibrosis: With risk of pulmonary complications.  Followed by pulmonary.  Continue bronchodilators, incentive spirometry and flutter valve therapy.  Mobilize.  Seen by pulmonary.  Do not think he has any exacerbation of underlying condition.   DVT prophylaxis: SCD's Start: 12/10/20 2311, heparin infusion   Code Status: Full code Family Communication: Wife at the bedside  Disposition Plan: Status is: Inpatient  Remains inpatient appropriate because:Inpatient level of care appropriate due to severity of illness   Dispo: The patient is from: Home  Anticipated d/c is to: Home              Anticipated d/c date  is: > 3 days              Patient currently is not medically stable to d/c.   Consultants:   Cardiology  Infectious disease  Vascular surgery  Orthopedic  Procedures:   TEE 1/7  Right lower extremity bypass 1/6  Antimicrobials:  Anti-infectives (From admission, onward)   Start     Dose/Rate Route Frequency Ordered Stop   12/14/20 1400  gentamicin (GARAMYCIN) 80 mg in sodium chloride 0.9 % 500 mL irrigation        80 mg Irrigation On call 12/13/20 1704 12/15/20 1400   12/14/20 1400  ceFAZolin (ANCEF) IVPB 2g/100 mL premix        2 g 200 mL/hr over 30 Minutes Intravenous On call 12/13/20 1704 12/15/20 1400   12/14/20 0600  vancomycin (VANCOREADY) IVPB 1500 mg/300 mL        1,500 mg 150 mL/hr over 120 Minutes Intravenous On call to O.R. 12/13/20 1549 12/15/20 0559   12/09/20 1430  ceFAZolin (ANCEF) IVPB 2g/100 mL premix        2 g 200 mL/hr over 30 Minutes Intravenous Every 8 hours 12/09/20 1418           Subjective: Seen and examined.  No overnight events.  Still has cough and worse with laying flat.  Afebrile.  Telemetry shows rate controlled a flutter.  Objective: Vitals:   12/14/20 0049 12/14/20 0441 12/14/20 0600 12/14/20 0820  BP:  (!) 126/52  (!) 147/74  Pulse:  74  65  Resp:  18    Temp:  98.2 F (36.8 C)    TempSrc:  Oral    SpO2: 100% 100%    Weight:   118.8 kg   Height:        Intake/Output Summary (Last 24 hours) at 12/14/2020 1129 Last data filed at 12/14/2020 0800 Gross per 24 hour  Intake 1292.27 ml  Output 575 ml  Net 717.27 ml   Filed Weights   12/12/20 0550 12/13/20 0510 12/14/20 0600  Weight: 122.1 kg 119.2 kg 118.8 kg    Examination:  General exam: Appears calm and comfortable  Currently adequate on room air. Comfortable at rest.  Able to take deep breaths. Respiratory system: Mostly clear.  No added sounds. Cardiovascular system: S1 & S2 heard, RRR.  Pacemaker in place.  Nontender.  Gastrointestinal system: Soft.  Nontender.   Bowel sounds present.  Obese and pendulous. Central nervous system: Alert and oriented. No focal neurological deficits. Extremities: Symmetric 5 x 5 power. Skin:  Sternotomy wound is healing. He has 2 small wound on his buttock, clean and dry. Right foot lateral wound, erythematous and swollen, superficial drain present.  No expressible pus or underlying collection.   Data Reviewed: I have personally reviewed following labs and imaging studies  CBC: Recent Labs  Lab 12/08/20 1805 12/09/20 0304 12/10/20 0354 12/11/20 0436 12/12/20 0203 12/13/20 0145 12/14/20 0222  WBC 10.2 8.2 11.9* 10.4 9.1 9.0 9.4  NEUTROABS 7.3 5.4  --   --   --   --   --   HGB 11.9* 10.7* 9.6* 9.1* 9.3* 8.8* 9.1*  HCT 35.8* 32.4* 28.4* 28.1* 27.5* 27.1* 28.0*  MCV 95.0 94.5 93.4 94.6 93.9 93.8 94.6  PLT 257 218 198 174 168 160 433   Basic Metabolic Panel: Recent Labs  Lab 12/08/20 1805 12/08/20 2045  12/09/20 0304 12/10/20 0354 12/11/20 0436 12/12/20 0203  NA 131*  --  129* 131* 129* 134*  K 4.5  --  4.6 4.2 3.9 4.2  CL 92*  --  93* 96* 99 102  CO2 26  --  24 25 21* 23  GLUCOSE 143*  --  153* 70 114* 162*  BUN 37*  --  37* 31* 28* 31*  CREATININE 1.58*  --  1.75* 1.78* 1.55* 1.52*  CALCIUM 9.3  --  8.4* 7.9* 7.4* 7.8*  MG  --  1.7 1.9  --   --   --    GFR: Estimated Creatinine Clearance: 52.4 mL/min (A) (by C-G formula based on SCr of 1.52 mg/dL (H)). Liver Function Tests: Recent Labs  Lab 12/08/20 1805 12/09/20 0304  AST 20 19  ALT 19 17  ALKPHOS 90 80  BILITOT 1.2 1.0  PROT 7.3 6.5  ALBUMIN 3.0* 2.5*   No results for input(s): LIPASE, AMYLASE in the last 168 hours. No results for input(s): AMMONIA in the last 168 hours. Coagulation Profile: No results for input(s): INR, PROTIME in the last 168 hours. Cardiac Enzymes: No results for input(s): CKTOTAL, CKMB, CKMBINDEX, TROPONINI in the last 168 hours. BNP (last 3 results) No results for input(s): PROBNP in the last 8760  hours. HbA1C: No results for input(s): HGBA1C in the last 72 hours. CBG: Recent Labs  Lab 12/13/20 0354 12/13/20 0723 12/13/20 1152 12/13/20 1707 12/13/20 2044  GLUCAP 113* 119* 167* 124* 95   Lipid Profile: No results for input(s): CHOL, HDL, LDLCALC, TRIG, CHOLHDL, LDLDIRECT in the last 72 hours. Thyroid Function Tests: No results for input(s): TSH, T4TOTAL, FREET4, T3FREE, THYROIDAB in the last 72 hours. Anemia Panel: No results for input(s): VITAMINB12, FOLATE, FERRITIN, TIBC, IRON, RETICCTPCT in the last 72 hours. Sepsis Labs: Recent Labs  Lab 12/08/20 1805 12/08/20 2045  LATICACIDVEN 2.1* 1.5    Recent Results (from the past 240 hour(s))  Blood culture (routine x 2)     Status: Abnormal   Collection Time: 12/08/20  6:05 PM   Specimen: BLOOD  Result Value Ref Range Status   Specimen Description BLOOD RIGHT ANTECUBITAL  Final   Special Requests   Final    BOTTLES DRAWN AEROBIC AND ANAEROBIC Blood Culture adequate volume   Culture  Setup Time   Final    GRAM POSITIVE COCCI IN CLUSTERS IN BOTH AEROBIC AND ANAEROBIC BOTTLES Organism ID to follow CRITICAL RESULT CALLED TO, READ BACK BY AND VERIFIED WITH: A WOLFE PHARMD 1407 12/09/20 A BROWNING Performed at [facility], [place] [address] [place], [place] [address]    Culture STAPHYLOCOCCUS AUREUS (A)  Final   Report Status 12/12/2020 FINAL  Final   Organism ID, Bacteria STAPHYLOCOCCUS AUREUS  Final      Susceptibility   Staphylococcus aureus - MIC*    CIPROFLOXACIN <=0.5 SENSITIVE Sensitive     ERYTHROMYCIN <=0.25 SENSITIVE Sensitive     GENTAMICIN <=0.5 SENSITIVE Sensitive     OXACILLIN 0.5 SENSITIVE Sensitive     TETRACYCLINE <=1 SENSITIVE Sensitive     VANCOMYCIN 1 SENSITIVE Sensitive     TRIMETH/SULFA <=10 SENSITIVE Sensitive     CLINDAMYCIN <=0.25 SENSITIVE Sensitive     RIFAMPIN <=0.5 SENSITIVE Sensitive     Inducible Clindamycin NEGATIVE Sensitive     * STAPHYLOCOCCUS AUREUS  Blood culture (routine x  2)     Status: Abnormal   Collection Time: 12/08/20  6:05 PM   Specimen: BLOOD  Result  Value Ref Range Status   Specimen Description BLOOD LEFT ANTECUBITAL  Final   Special Requests   Final    BOTTLES DRAWN AEROBIC AND ANAEROBIC Blood Culture adequate volume   Culture  Setup Time   Final    GRAM POSITIVE COCCI IN CLUSTERS IN BOTH AEROBIC AND ANAEROBIC BOTTLES CRITICAL VALUE NOTED.  VALUE IS CONSISTENT WITH PREVIOUSLY REPORTED AND CALLED VALUE.    Culture (A)  Final    STAPHYLOCOCCUS AUREUS SUSCEPTIBILITIES PERFORMED ON PREVIOUS CULTURE WITHIN THE LAST 5 DAYS. Performed at [facility], [place] [address], [place], [place] [address]    Report Status 12/12/2020 FINAL  Final  Resp Panel by RT-PCR (Flu A&B, Covid) Nasopharyngeal Swab     Status: None   Collection Time: 12/08/20  6:05 PM   Specimen: Nasopharyngeal Swab; Nasopharyngeal(NP) swabs in vial transport medium  Result Value Ref Range Status   SARS Coronavirus 2 by RT PCR NEGATIVE NEGATIVE Final    Comment: (NOTE) SARS-CoV-2 target nucleic acids are NOT DETECTED.  The SARS-CoV-2 RNA is generally detectable in upper respiratory specimens during the acute phase of infection. The lowest concentration of SARS-CoV-2 viral copies this assay can detect is 138 copies/mL. A negative result does not preclude SARS-Cov-2 infection and should not be used as the sole basis for treatment or other patient management decisions. A negative result may occur with  improper specimen collection/handling, submission of specimen other than nasopharyngeal swab, presence of viral mutation(s) within the areas targeted by this assay, and inadequate number of viral copies(<138 copies/mL). A negative result must be combined with clinical observations, patient history, and epidemiological information. The expected result is Negative.  Fact Sheet for Patients:  EntrepreneurPulse.com.au  Fact Sheet for Healthcare Providers:   IncredibleEmployment.be  This test is no t yet approved or cleared by the [place] FDA and  has been authorized for detection and/or diagnosis of SARS-CoV-2 by FDA under an Emergency Use Authorization (EUA). This EUA will remain  in effect (meaning this test can be used) for the duration of the COVID-19 declaration under Section 564(b)(1) of the Act, 21 U.S.C.section 360bbb-3(b)(1), unless the authorization is terminated  or revoked sooner.       Influenza A by PCR NEGATIVE NEGATIVE Final   Influenza B by PCR NEGATIVE NEGATIVE Final    Comment: (NOTE) The Xpert Xpress SARS-CoV-2/FLU/RSV plus assay is intended as an aid in the diagnosis of influenza from Nasopharyngeal swab specimens and should not be used as a sole basis for treatment. Nasal washings and aspirates are unacceptable for Xpert Xpress SARS-CoV-2/FLU/RSV testing.  Fact Sheet for Patients: EntrepreneurPulse.com.au  Fact Sheet for Healthcare Providers: IncredibleEmployment.be  This test is not yet approved or cleared by the [place] FDA and has been authorized for detection and/or diagnosis of SARS-CoV-2 by FDA under an Emergency Use Authorization (EUA). This EUA will remain in effect (meaning this test can be used) for the duration of the COVID-19 declaration under Section 564(b)(1) of the Act, 21 U.S.C. section 360bbb-3(b)(1), unless the authorization is terminated or revoked.  Performed at [facility], [place] [address], [place], [place] [address]   Blood Culture ID Panel (Reflexed)     Status: Abnormal   Collection Time: 12/08/20  6:05 PM  Result Value Ref Range Status   Enterococcus faecalis NOT DETECTED NOT DETECTED Final   Enterococcus Faecium NOT DETECTED NOT DETECTED Final   Listeria monocytogenes NOT DETECTED NOT DETECTED Final   Staphylococcus species DETECTED (A) NOT DETECTED Final  Comment: CRITICAL RESULT CALLED TO, READ BACK BY  AND VERIFIED WITH: A WOLFE PHARMD 1407 12/09/20 A BROWNING    Staphylococcus aureus (BCID) DETECTED (A) NOT DETECTED Final    Comment: CRITICAL RESULT CALLED TO, READ BACK BY AND VERIFIED WITH: A WOLFE PHARMD 1407 12/09/20 A BROWNING    Staphylococcus epidermidis NOT DETECTED NOT DETECTED Final   Staphylococcus lugdunensis NOT DETECTED NOT DETECTED Final   Streptococcus species NOT DETECTED NOT DETECTED Final   Streptococcus agalactiae NOT DETECTED NOT DETECTED Final   Streptococcus pneumoniae NOT DETECTED NOT DETECTED Final   Streptococcus pyogenes NOT DETECTED NOT DETECTED Final   A.calcoaceticus-baumannii NOT DETECTED NOT DETECTED Final   Bacteroides fragilis NOT DETECTED NOT DETECTED Final   Enterobacterales NOT DETECTED NOT DETECTED Final   Enterobacter cloacae complex NOT DETECTED NOT DETECTED Final   Escherichia coli NOT DETECTED NOT DETECTED Final   Klebsiella aerogenes NOT DETECTED NOT DETECTED Final   Klebsiella oxytoca NOT DETECTED NOT DETECTED Final   Klebsiella pneumoniae NOT DETECTED NOT DETECTED Final   Proteus species NOT DETECTED NOT DETECTED Final   Salmonella species NOT DETECTED NOT DETECTED Final   Serratia marcescens NOT DETECTED NOT DETECTED Final   Haemophilus influenzae NOT DETECTED NOT DETECTED Final   Neisseria meningitidis NOT DETECTED NOT DETECTED Final   Pseudomonas aeruginosa NOT DETECTED NOT DETECTED Final   Stenotrophomonas maltophilia NOT DETECTED NOT DETECTED Final   Candida albicans NOT DETECTED NOT DETECTED Final   Candida auris NOT DETECTED NOT DETECTED Final   Candida glabrata NOT DETECTED NOT DETECTED Final   Candida krusei NOT DETECTED NOT DETECTED Final   Candida parapsilosis NOT DETECTED NOT DETECTED Final   Candida tropicalis NOT DETECTED NOT DETECTED Final   Cryptococcus neoformans/gattii NOT DETECTED NOT DETECTED Final   Meth resistant mecA/C and MREJ NOT DETECTED NOT DETECTED Final    Comment: Performed at [facility], [address] [place], [place] [address]  Culture, blood (routine x 2)     Status: None (Preliminary result)   Collection Time: 12/11/20  4:36 AM   Specimen: BLOOD LEFT HAND  Result Value Ref Range Status   Specimen Description BLOOD LEFT HAND  Final   Special Requests   Final    BOTTLES DRAWN AEROBIC AND ANAEROBIC Blood Culture adequate volume   Culture   Final    NO GROWTH 3 DAYS Performed at [facility], [place] [address] [place], [place] [address]    Report Status PENDING  Incomplete  Culture, blood (routine x 2)     Status: None (Preliminary result)   Collection Time: 12/11/20  4:37 AM   Specimen: BLOOD RIGHT HAND  Result Value Ref Range Status   Specimen Description BLOOD RIGHT HAND  Final   Special Requests   Final    BOTTLES DRAWN AEROBIC AND ANAEROBIC Blood Culture adequate volume   Culture   Final    NO GROWTH 3 DAYS Performed at [facility], [place] [address], [place], [place] [address]    Report Status PENDING  Incomplete  MRSA PCR Screening     Status: None   Collection Time: 12/13/20  3:06 PM   Specimen: Nasal Mucosa; Nasopharyngeal  Result Value Ref Range Status   MRSA by PCR NEGATIVE NEGATIVE Final    Comment:        The GeneXpert MRSA Assay (FDA approved for NASAL specimens only), is one component of a comprehensive MRSA colonization surveillance program. It is not intended to diagnose  MRSA infection nor to guide or monitor treatment for MRSA infections. Performed at [facility], [place] [address], [place], [place] [address]          Radiology Studies: [place] EKG SITE RITE  Result Date: 12/14/2020 If Site Rite image not attached, placement could not be confirmed due to current cardiac rhythm.       Scheduled Meds: . aspirin EC  81 mg Oral Daily  . atorvastatin  40 mg Oral Daily  . chlorhexidine  60 mL Topical Once  . furosemide  40 mg Oral Daily  . gentamicin irrigation  80 mg Irrigation On Call  . insulin aspart  0-9 Units Subcutaneous  TID WC  . insulin aspart  7 Units Subcutaneous TID WC  . insulin detemir  15 Units Subcutaneous QHS  . metoprolol tartrate  25 mg Oral BID  . multivitamin with minerals  1 tablet Oral Daily  . pantoprazole  40 mg Oral Daily  . Ensure Max Protein  11 oz Oral BID  . sodium chloride flush  3 mL Intravenous Q12H   Continuous Infusions: . sodium chloride    . sodium chloride    . sodium chloride    .  ceFAZolin (ANCEF) IV 2 g (12/14/20 0551)  .  ceFAZolin (ANCEF) IV    . vancomycin       LOS: 5 days    Time spent: 30 minutes    Barb Merino, MD [facility] Pager [phone]

## 2020-12-14 NOTE — Progress Notes (Addendum)
Electrophysiology Rounding Note  Patient Name: Shane [name] Date of Encounter: 12/14/2020  Primary Cardiologist: Kirk Ruths, MD Electrophysiologist: Cristopher Peru, MD   Subjective   The patient is doing well today.  At this time, the patient denies chest pain, shortness of breath, or any new concerns.  Inpatient Medications    Scheduled Meds: . aspirin EC  81 mg Oral Daily  . atorvastatin  40 mg Oral Daily  . chlorhexidine  60 mL Topical Once  . furosemide  40 mg Oral Daily  . gentamicin irrigation  80 mg Irrigation On Call  . insulin aspart  0-9 Units Subcutaneous TID WC  . insulin aspart  7 Units Subcutaneous TID WC  . insulin detemir  15 Units Subcutaneous QHS  . metoprolol tartrate  25 mg Oral BID  . multivitamin with minerals  1 tablet Oral Daily  . pantoprazole  40 mg Oral Daily  . Ensure Max Protein  11 oz Oral BID  . sodium chloride flush  3 mL Intravenous Q12H   Continuous Infusions: . sodium chloride    . sodium chloride    . sodium chloride    .  ceFAZolin (ANCEF) IV 2 g (12/14/20 0551)  .  ceFAZolin (ANCEF) IV    . vancomycin     PRN Meds: sodium chloride, acetaminophen, albuterol, hydrALAZINE, ketotifen, labetalol, LORazepam, ondansetron (ZOFRAN) IV, polyethylene glycol, prochlorperazine, sodium chloride flush   Vital Signs    Vitals:   12/14/20 0049 12/14/20 0441 12/14/20 0600 12/14/20 0820  BP:  (!) 126/52  (!) 147/74  Pulse:  74  65  Resp:  18    Temp:  98.2 F (36.8 C)    TempSrc:  Oral    SpO2: 100% 100%    Weight:   118.8 kg   Height:        Intake/Output Summary (Last 24 hours) at 12/14/2020 1205 Last data filed at 12/14/2020 0800 Gross per 24 hour  Intake 1292.27 ml  Output 575 ml  Net 717.27 ml   Filed Weights   12/12/20 0550 12/13/20 0510 12/14/20 0600  Weight: 122.1 kg 119.2 kg 118.8 kg    Physical Exam    GEN- The patient is well appearing, alert and oriented x 3 today.   Head- normocephalic, atraumatic Eyes-   Sclera clear, conjunctiva pink Ears- hearing intact Oropharynx- clear Neck- supple Lungs- Clear to ausculation bilaterally, normal work of breathing Heart- Regular rate and rhythm, no murmurs, rubs or gallops GI- soft, NT, ND, + BS Extremities- no clubbing or cyanosis. No edema Skin- no rash or lesion Psych- euthymic mood, full affect Neuro- strength and sensation are intact  Labs    CBC Recent Labs    12/13/20 0145 12/14/20 0222  WBC 9.0 9.4  HGB 8.8* 9.1*  HCT 27.1* 28.0*  MCV 93.8 94.6  PLT 160 202   Basic Metabolic Panel Recent Labs    12/12/20 0203  NA 134*  K 4.2  CL 102  CO2 23  GLUCOSE 162*  BUN 31*  CREATININE 1.52*  CALCIUM 7.8*   Liver Function Tests No results for input(s): AST, ALT, ALKPHOS, BILITOT, PROT, ALBUMIN in the last 72 hours. No results for input(s): LIPASE, AMYLASE in the last 72 hours. Cardiac Enzymes No results for input(s): CKTOTAL, CKMB, CKMBINDEX, TROPONINI in the last 72 hours.   Telemetry    AF/AFL with rates in 70s (personally reviewed)  Radiology    [place] EKG SITE RITE  Result Date: 12/14/2020 If [facility]  image not attached, placement could not be confirmed due to current cardiac rhythm.   Patient Profile     80 y.o. male admitted with sepsis and found to have staph bacteremia  Assessment & Plan    1.  Staph bacteremia  - He has improved with intravenous antibiotics He will undergo pacemaker extraction today 2.  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  Plan extraction of his permanent pacemaker this afternoon. 3.  Anticoagulation  -his heparin has been held  4.  Atrial fibrillation/flutter  - his ventricular rates have been well controlled.   He does not have any questions concerning his procedure today. Dr. Lovena Le has seen.   For questions or updates, please contact [place] Please consult [email or web address] for contact info under Cardiology/STEMI.  Signed, Shirley Friar,  PA-C  12/14/2020, 12:05 PM   EP attending  Patient seen and examined.  Agree with the findings as noted above.  The patient is stable today.  He is tolerated his intravenous antibiotics very nicely.  I discussed the indications, risk, benefits, goals, and expectations of removal of his dual-chamber pacemaker and he wishes to proceed.  Crissie Sickles, MD

## 2020-12-14 NOTE — Progress Notes (Signed)
Occupational Therapy Treatment Patient Details Name: Shane [name] MRN: [id number] DOB: 12/10/1940 Today's Date: 12/14/2020    History of present illness Pt is a 80 y/o male admitted secondary to generalized weakness and chills. Found to also have a flutter. Pt with 5th metatarsal wound. Underwent aterial stents of RLE on 12/10/20.  Pt is s/p CABG 4 weeks ago at [facility]. PMH includes OSA on CPAP, a fib, HTN, CKD, CAD s/p pacemaker, and pulmonary fibrosis.   OT comments  Pt concerned he will begin coughing with moving about and be unable to have cough medicine as he is NPO for procedure. Limited activity to bed side grooming. Reviewed energy conservation strategies. Pt has been living with pulmonary fibrosis and is well aware of strategies. Will continue to follow.   Follow Up Recommendations  Supervision - Intermittent;No OT follow up    Equipment Recommendations  None recommended by OT    Recommendations for Other Services      Precautions / Restrictions Precautions Precautions: Fall;Sternal       Mobility Bed Mobility Overal bed mobility: Needs Assistance Bed Mobility: Supine to Sit;Sit to Supine     Supine to sit: Min assist;HOB elevated Sit to supine: Supervision   General bed mobility comments: Assist to elevate trunk into sitting  Transfers                 General transfer comment: pt declined OOB, awaiting procedure and is NPO, afraid if he moves around he will start coughing and not be able to have any cough medicine    Balance Overall balance assessment: Needs assistance   Sitting balance-Leahy Scale: Good                                     ADL either performed or assessed with clinical judgement   ADL Overall ADL's : Needs assistance/impaired Eating/Feeding: NPO   Grooming: Oral care;Sitting;Set up                                 General ADL Comments: pt has all necessary AE for LB ADL and DME     Vision        Perception     Praxis      Cognition Arousal/Alertness: Awake/alert Behavior During Therapy: WFL for tasks assessed/performed Overall Cognitive Status: Within Functional Limits for tasks assessed                                          Exercises     Shoulder Instructions       General Comments      Pertinent Vitals/ Pain       Pain Assessment: No/denies pain  Home Living                                          Prior Functioning/Environment              Frequency  Min 2X/week        Progress Toward Goals  OT Goals(current goals can now be found in the care plan section)  Progress towards OT goals: Progressing toward goals  Acute Rehab  OT Goals Patient Stated Goal: to feel better OT Goal Formulation: With patient/family Time For Goal Achievement: 12/24/20 Potential to Achieve Goals: Good  Plan Discharge plan remains appropriate    Co-evaluation                 AM-PAC OT "6 Clicks" Daily Activity     Outcome Measure   Help from another person eating meals?: None Help from another person taking care of personal grooming?: A Little Help from another person toileting, which includes using toliet, bedpan, or urinal?: A Lot Help from another person bathing (including washing, rinsing, drying)?: A Lot Help from another person to put on and taking off regular upper body clothing?: A Little Help from another person to put on and taking off regular lower body clothing?: A Lot 6 Click Score: 16    End of Session    OT Visit Diagnosis: Unsteadiness on feet (R26.81);Muscle weakness (generalized) (M62.81)   Activity Tolerance Patient tolerated treatment well   Patient Left in bed;with call bell/phone within reach;with family/visitor present   Nurse Communication          Time: 9480-1655 OT Time Calculation (min): 19 min  Charges: OT General Charges $OT Visit: 1 Visit OT Treatments $Self Care/Home  Management : 8-22 mins  Nestor Lewandowsky, OTR/L Acute Rehabilitation Services Pager: [phone] Office: [phone]   Malka So 12/14/2020, 3:04 PM

## 2020-12-14 NOTE — Progress Notes (Signed)
ANTICOAGULATION CONSULT NOTE - Follow Up Consult  Pharmacy Consult for Apixaban to Heparin Indication: atrial fibrillation   Labs: Recent Labs    12/11/20 0436 12/11/20 1101 12/12/20 0203 12/12/20 1030 12/13/20 0145 12/13/20 1605 12/14/20 0222  HGB 9.1*  --  9.3*  --  8.8*  --  9.1*  HCT 28.1*  --  27.5*  --  27.1*  --  28.0*  PLT 174  --  168  --  160  --  203  APTT  --    < > 72*   < > 54* 52* 80*  HEPARINUNFRC  --    < > 0.96*  --  0.48  --  0.46  CREATININE 1.55*  --  1.52*  --   --   --   --    < > = values in this interval not displayed.   Assessment: 80 year old male with hx of atrial fibrillation to begin heparin after vascular procedure today; last dose of apixaban was on 1/5 PM. Given recent apixaban exposure, will monitor anticoagulation using aPTT until aPTT and heparin levels correlate.  APTT 80 sec, heparin level 0.46  Goal of Therapy:  aPTT 66-102 seconds seconds Monitor platelets by anticoagulation protocol: Yes  Heparin level 0.3-0.7 units/ml   Plan:  Continue heparin at 1900 units/hr 8 hour aPTT and heparin level to confirm Daily aPTT, heparin level, CBC  Thanks for allowing pharmacy to be a part of this patient's care.  Excell Seltzer, PharmD Clinical Pharmacist

## 2020-12-14 NOTE — Progress Notes (Signed)
Pharmacy called and spoke with Crystal RPh  to follow-up on when pt heparin drip will be resumed. Per Pharmacy, will wait for MD to give US guidance on when to restart. HS RN updated with conversation.

## 2020-12-14 NOTE — H&P (View-Only) (Signed)
Electrophysiology Rounding Note  Patient Name: Shane [name] Date of Encounter: 12/14/2020  Primary Cardiologist: Kirk Ruths, MD Electrophysiologist: Cristopher Peru, MD   Subjective   The patient is doing well today.  At this time, the patient denies chest pain, shortness of breath, or any new concerns.  Inpatient Medications    Scheduled Meds: . aspirin EC  81 mg Oral Daily  . atorvastatin  40 mg Oral Daily  . chlorhexidine  60 mL Topical Once  . furosemide  40 mg Oral Daily  . gentamicin irrigation  80 mg Irrigation On Call  . insulin aspart  0-9 Units Subcutaneous TID WC  . insulin aspart  7 Units Subcutaneous TID WC  . insulin detemir  15 Units Subcutaneous QHS  . metoprolol tartrate  25 mg Oral BID  . multivitamin with minerals  1 tablet Oral Daily  . pantoprazole  40 mg Oral Daily  . Ensure Max Protein  11 oz Oral BID  . sodium chloride flush  3 mL Intravenous Q12H   Continuous Infusions: . sodium chloride    . sodium chloride    . sodium chloride    .  ceFAZolin (ANCEF) IV 2 g (12/14/20 0551)  .  ceFAZolin (ANCEF) IV    . vancomycin     PRN Meds: sodium chloride, acetaminophen, albuterol, hydrALAZINE, ketotifen, labetalol, LORazepam, ondansetron (ZOFRAN) IV, polyethylene glycol, prochlorperazine, sodium chloride flush   Vital Signs    Vitals:   12/14/20 0049 12/14/20 0441 12/14/20 0600 12/14/20 0820  BP:  (!) 126/52  (!) 147/74  Pulse:  74  65  Resp:  18    Temp:  98.2 F (36.8 C)    TempSrc:  Oral    SpO2: 100% 100%    Weight:   118.8 kg   Height:        Intake/Output Summary (Last 24 hours) at 12/14/2020 1205 Last data filed at 12/14/2020 0800 Gross per 24 hour  Intake 1292.27 ml  Output 575 ml  Net 717.27 ml   Filed Weights   12/12/20 0550 12/13/20 0510 12/14/20 0600  Weight: 122.1 kg 119.2 kg 118.8 kg    Physical Exam    GEN- The patient is well appearing, alert and oriented x 3 today.   Head- normocephalic, atraumatic Eyes-   Sclera clear, conjunctiva pink Ears- hearing intact Oropharynx- clear Neck- supple Lungs- Clear to ausculation bilaterally, normal work of breathing Heart- Regular rate and rhythm, no murmurs, rubs or gallops GI- soft, NT, ND, + BS Extremities- no clubbing or cyanosis. No edema Skin- no rash or lesion Psych- euthymic mood, full affect Neuro- strength and sensation are intact  Labs    CBC Recent Labs    12/13/20 0145 12/14/20 0222  WBC 9.0 9.4  HGB 8.8* 9.1*  HCT 27.1* 28.0*  MCV 93.8 94.6  PLT 160 480   Basic Metabolic Panel Recent Labs    12/12/20 0203  NA 134*  K 4.2  CL 102  CO2 23  GLUCOSE 162*  BUN 31*  CREATININE 1.52*  CALCIUM 7.8*   Liver Function Tests No results for input(s): AST, ALT, ALKPHOS, BILITOT, PROT, ALBUMIN in the last 72 hours. No results for input(s): LIPASE, AMYLASE in the last 72 hours. Cardiac Enzymes No results for input(s): CKTOTAL, CKMB, CKMBINDEX, TROPONINI in the last 72 hours.   Telemetry    AF/AFL with rates in 70s (personally reviewed)  Radiology    [place] EKG SITE RITE  Result Date: 12/14/2020 If [facility]  image not attached, placement could not be confirmed due to current cardiac rhythm.   Patient Profile     80 y.o. male admitted with sepsis and found to have staph bacteremia  Assessment & Plan    1.  Staph bacteremia  - He has improved with intravenous antibiotics He will undergo pacemaker extraction today 2.  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  Plan extraction of his permanent pacemaker this afternoon. 3.  Anticoagulation  -his heparin has been held  4.  Atrial fibrillation/flutter  - his ventricular rates have been well controlled.   He does not have any questions concerning his procedure today. Dr. Lovena Le has seen.   For questions or updates, please contact [place] Please consult [email or web address] for contact info under Cardiology/STEMI.  Signed, Shirley Friar,  PA-C  12/14/2020, 12:05 PM   EP attending  Patient seen and examined.  Agree with the findings as noted above.  The patient is stable today.  He is tolerated his intravenous antibiotics very nicely.  I discussed the indications, risk, benefits, goals, and expectations of removal of his dual-chamber pacemaker and he wishes to proceed.  Crissie Sickles, MD

## 2020-12-15 ENCOUNTER — Encounter (HOSPITAL_COMMUNITY): Payer: Self-pay | Admitting: Internal Medicine

## 2020-12-15 ENCOUNTER — Inpatient Hospital Stay (HOSPITAL_COMMUNITY): Payer: Medicare Other

## 2020-12-15 ENCOUNTER — Telehealth: Payer: Self-pay | Admitting: Physician Assistant

## 2020-12-15 DIAGNOSIS — B9561 Methicillin susceptible Staphylococcus aureus infection as the cause of diseases classified elsewhere: Secondary | ICD-10-CM | POA: Diagnosis not present

## 2020-12-15 DIAGNOSIS — R7881 Bacteremia: Secondary | ICD-10-CM | POA: Diagnosis not present

## 2020-12-15 LAB — BASIC METABOLIC PANEL
Anion gap: 9 (ref 5–15)
BUN: 28 mg/dL — ABNORMAL HIGH (ref 8–23)
CO2: 23 mmol/L (ref 22–32)
Calcium: 8 mg/dL — ABNORMAL LOW (ref 8.9–10.3)
Chloride: 100 mmol/L (ref 98–111)
Creatinine, Ser: 1.41 mg/dL — ABNORMAL HIGH (ref 0.61–1.24)
GFR, Estimated: 51 mL/min — ABNORMAL LOW (ref >60)
Glucose, Bld: 147 mg/dL — ABNORMAL HIGH (ref 70–99)
Potassium: 4.6 mmol/L (ref 3.5–5.1)
Sodium: 132 mmol/L — ABNORMAL LOW (ref 135–145)

## 2020-12-15 LAB — GLUCOSE, CAPILLARY
Glucose-Capillary: 115 mg/dL — ABNORMAL HIGH (ref 70–99)
Glucose-Capillary: 150 mg/dL — ABNORMAL HIGH (ref 70–99)
Glucose-Capillary: 131 mg/dL — ABNORMAL HIGH (ref 70–99)
Glucose-Capillary: 116 mg/dL — ABNORMAL HIGH (ref 70–99)

## 2020-12-15 LAB — CBC
HCT: 25.7 % — ABNORMAL LOW (ref 39.0–52.0)
Hemoglobin: 8.4 g/dL — ABNORMAL LOW (ref 13.0–17.0)
MCH: 30.9 pg (ref 26.0–34.0)
MCHC: 32.7 g/dL (ref 30.0–36.0)
MCV: 94.5 fL (ref 80.0–100.0)
Platelets: 204 10*3/uL (ref 150–400)
RBC: 2.72 MIL/uL — ABNORMAL LOW (ref 4.22–5.81)
RDW: 13.6 % (ref 11.5–15.5)
WBC: 9.2 10*3/uL (ref 4.0–10.5)
nRBC: 0 % (ref 0.0–0.2)

## 2020-12-15 LAB — APTT: aPTT: 39 seconds — ABNORMAL HIGH (ref 24–36)

## 2020-12-15 LAB — HEPARIN LEVEL (UNFRACTIONATED): Heparin Unfractionated: <0.1 IU/mL — ABNORMAL LOW (ref 0.30–0.70)

## 2020-12-15 MED ORDER — APIXABAN 5 MG PO TABS: 5.0000 mg | ORAL_TABLET | Freq: Two times a day (BID) | ORAL | Status: DC

## 2020-12-15 MED ORDER — HYDROCODONE-HOMATROPINE 5-1.5 MG/5ML PO SYRP: 5.0000 mL | ORAL_SOLUTION | Freq: Once | ORAL | Status: DC

## 2020-12-15 MED FILL — Lidocaine HCl Local Inj 1%: INTRAMUSCULAR | Qty: 60 | Status: AC

## 2020-12-15 NOTE — Progress Notes (Addendum)
[facility] for Infectious Disease  Date of Admission:  12/08/2020           Reason for visit: Follow up on MSSA bacteremia  Current antibiotics: Cefazolin  Principal Problem:   MSSA bacteremia Active Problems:   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HCC)   OSA on CPAP   Essential hypertension   Chronic kidney disease, stage 3a (HCC)   Coronary artery disease involving native heart without angina pectoris   Cardiac device in situ   Generalized weakness   Dehydration with hyponatremia   Atrial fibrillation, chronic (HCC)   Elevated troponin level not due myocardial infarction   Diabetic foot ulcer ([place])   Acute bacterial endocarditis   ASSESSMENT:    1. 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  Blood cultures cleared as of December 11, 2020.  Status post device extraction December 14, 2020 2. Peripheral vascular disease-status post vascular surgery intervention December 10, 2020 in setting of right foot wound 3. Right foot wound- evaluated by orthopedics earlier in this admission.  No surgical plans at the moment.  CT scan without evidence of osteomyelitis or abscess 4. Chronic kidney disease- creatinine trending down 5. Coronary artery disease status post CABG December 2021 at [facility]:    . Continue cefazolin.  Plan for 6-week course from date of device removal . EP note reviewed regarding follow-up with outpatient monitoring to assess need for reimplantation at a later date . If reimplantation is needed in the future would aim for surveillance blood cultures being negative at least 14 days following device removal prior to any reimplantation . Okay to place PICC line . Wound care . See OPAT note below.  We will sign off for now, please call with questions  Diagnosis: MSSA bacteremia and PPM endocarditis  Culture Result: MSSA  Allergies  Allergen Reactions  . Codeine Hives and Itching  . Ofev [Nintedanib] Diarrhea     SEVERE DIARRHEA  . Other Diarrhea and Other (See Comments)    Esbriet causes elevated LFTs. D/C on 06/14/17 and SEVERE DIARRHEA    OPAT Orders Discharge antibiotics to be given via PICC line Discharge antibiotics: Cefazolin 2 grams every 8 hours  Per pharmacy protocol   Duration: 6 weeks End Date: January 25, 2021  [facility] Care Per Protocol:  Home health RN for IV administration and teaching; PICC line care and labs.    Labs weekly while on IV antibiotics: _x_ CBC with differential _x_ BMP __ CMP __ CRP __ ESR __ Vancomycin trough __ CK  _x_ Please pull PIC at completion of IV antibiotics __ Please leave PIC in place until doctor has seen patient or been notified  Fax weekly labs to [phone]  Clinic Follow Up Appt: 12/29/20 @ 245pm with Dr Juleen China   SUBJECTIVE:   Status post pacemaker extraction yesterday Afebrile, no chest pain, no fevers, no new complaints Checks x-ray today shows no acute changes.  Interval removal of cardiac pacer noted. January 7 blood cultures remain negative Renal function improved Normal WBC  Review of Systems  Constitutional: Negative for chills and fever.  Respiratory: Negative for cough and shortness of breath.   Cardiovascular: Negative for chest pain.  Gastrointestinal: Negative.   All other systems reviewed and are negative.  OBJECTIVE:   Blood pressure (!) 145/50, pulse 61, temperature 98.1 F (36.7 C), temperature source Oral, resp. rate 20, height 6' (1.829 m), weight 117.9 kg, SpO2 99 %. Body mass index is  35.26 kg/m.  Physical Exam Constitutional:      General: He is not in acute distress.    Appearance: He is well-developed.  HENT:     Head: Normocephalic and atraumatic.  Pulmonary:     Effort: Pulmonary effort is normal.  Neurological:     General: No focal deficit present.     Mental Status: He is alert.  Psychiatric:        Mood and Affect: Mood normal.        Behavior: Behavior normal.      Lab  Results: Lab Results  Component Value Date   WBC 9.2 12/15/2020   HGB 8.4 (L) 12/15/2020   HCT 25.7 (L) 12/15/2020   MCV 94.5 12/15/2020   PLT 204 12/15/2020    Lab Results  Component Value Date   NA 132 (L) 12/15/2020   K 4.6 12/15/2020   CO2 23 12/15/2020   GLUCOSE 147 (H) 12/15/2020   BUN 28 (H) 12/15/2020   CREATININE 1.41 (H) 12/15/2020   CALCIUM 8.0 (L) 12/15/2020   GFRNONAA 51 (L) 12/15/2020   GFRAA 53 (L) 07/17/2020    Lab Results  Component Value Date   ALT 17 12/09/2020   AST 19 12/09/2020   ALKPHOS 80 12/09/2020   BILITOT 1.0 12/09/2020    I have reviewed the micro and lab results in Epic.  Imaging: DG Chest 2 View  Result Date: 12/15/2020 CLINICAL DATA:  Pacemaker infection. EXAM: CHEST - 2 VIEW COMPARISON:  Chest CT and chest x-ray 12/08/2020. Chest x-ray 04/27/2018. FINDINGS: Interval removal of cardiac pacer. Prior CABG. Stable cardiomegaly. Stable chronic interstitial changes. Stable left-sided pleural thickening. No pneumothorax. Prior cervical spine fusion. IMPRESSION: 1. Interval removal of cardiac pacer. No pneumothorax. 2. Stable cardiomegaly. Prior CABG. 3. Stable chronic interstitial changes. Electronically Signed   By: Marcello Moores  Register   On: 12/15/2020 06:19   EP PPM/ICD IMPLANT  Result Date: 12/14/2020 Conclusion: Successful extraction of a Medtronic dual-chamber pacing system in a patient with staff aureus bacteremia and evidence of pacemaker system endocarditis. Cristopher Peru, MD  [place] EKG SITE RITE  Result Date: 12/14/2020 If Site Rite image not attached, placement could not be confirmed due to current cardiac rhythm.    Imaging independently reviewed in Epic.    Raynelle Highland for Infectious Disease [facility] [phone] pager 12/15/2020, 8:45 AM

## 2020-12-15 NOTE — Plan of Care (Signed)
  Problem: Coping: Goal: Level of anxiety will decrease Outcome: Progressing  Problem: Elimination: Goal: Will not experience complications related to bowel motility 12/15/2020 0745 by Cicero Duck, RN Outcome: Progressing  Problem: Safety: Goal: Ability to remain free from injury will improve 12/15/2020 0745 by Cicero Duck, RN Outcome: Progressing   Goal: Will not experience complications related to urinary retention 12/15/2020 0745 by Cicero Duck, RN Outcome: Progressing   Problem: Activity: Goal: Ability to tolerate increased activity will improve Outcome: Progressing

## 2020-12-15 NOTE — Progress Notes (Addendum)
Electrophysiology Rounding Note  Patient Name: Shane [name] Date of Encounter: 12/15/2020  Primary Cardiologist: Kirk Ruths, MD Electrophysiologist: Cristopher Peru, MD   Subjective   S/p Biotronik PPM extraction due to bacteremia and pacemaker endocarditis.  The patient is doing well today.  At this time, the patient denies chest pain, shortness of breath, or any new concerns.  Inpatient Medications    Scheduled Meds:  aspirin EC  81 mg Oral Daily   atorvastatin  40 mg Oral Daily   furosemide  40 mg Oral Daily   HYDROcodone-homatropine  5 mL Oral Once   insulin aspart  0-9 Units Subcutaneous TID WC   insulin aspart  7 Units Subcutaneous TID WC   insulin detemir  15 Units Subcutaneous QHS   metoprolol tartrate  25 mg Oral BID   multivitamin with minerals  1 tablet Oral Daily   pantoprazole  40 mg Oral Daily   Ensure Max Protein  11 oz Oral BID   sodium chloride flush  3 mL Intravenous Q12H   Continuous Infusions:  sodium chloride 250 mL (12/14/20 2330)    ceFAZolin (ANCEF) IV 2 g (12/15/20 0659)   PRN Meds: sodium chloride, acetaminophen, albuterol, hydrALAZINE, HYDROcodone-homatropine, ketotifen, labetalol, LORazepam, ondansetron (ZOFRAN) IV, polyethylene glycol, prochlorperazine, promethazine, sodium chloride flush   Vital Signs    Vitals:   12/14/20 2256 12/14/20 2356 12/15/20 0610 12/15/20 0618  BP: 130/63 (!) 112/57  (!) 145/50  Pulse: 86 62  61  Resp:  17  20  Temp:  98.6 F (37 C)  98.1 F (36.7 C)  TempSrc:  Oral  Oral  SpO2:  94%  99%  Weight:   117.9 kg   Height:        Intake/Output Summary (Last 24 hours) at 12/15/2020 0807 Last data filed at 12/15/2020 0015 Gross per 24 hour  Intake 167.17 ml  Output 200 ml  Net -32.83 ml   Filed Weights   12/13/20 0510 12/14/20 0600 12/15/20 0610  Weight: 119.2 kg 118.8 kg 117.9 kg    Physical Exam    GEN- The patient is well appearing, alert and oriented x 3 today.   Head- normocephalic,  atraumatic Eyes-  Sclera clear, conjunctiva pink Ears- hearing intact Oropharynx- clear Neck- supple Lungs- Clear to ausculation bilaterally, normal work of breathing Heart- Regular rate and rhythm, no murmurs, rubs or gallops GI- soft, NT, ND, + BS Extremities- no clubbing or cyanosis. No edema Skin- no rash or lesion Psych- euthymic mood, full affect Neuro- strength and sensation are intact  Labs    CBC Recent Labs    12/14/20 0222 12/15/20 0257  WBC 9.4 9.2  HGB 9.1* 8.4*  HCT 28.0* 25.7*  MCV 94.6 94.5  PLT 203 824   Basic Metabolic Panel Recent Labs    12/15/20 0257  NA 132*  K 4.6  CL 100  CO2 23  GLUCOSE 147*  BUN 28*  CREATININE 1.41*  CALCIUM 8.0*   Liver Function Tests No results for input(s): AST, ALT, ALKPHOS, BILITOT, PROT, ALBUMIN in the last 72 hours. No results for input(s): LIPASE, AMYLASE in the last 72 hours. Cardiac Enzymes No results for input(s): CKTOTAL, CKMB, CKMBINDEX, TROPONINI in the last 72 hours.   Telemetry    AFL with controlled ventricular rates (personally reviewed)  Radiology    DG Chest 2 View  Result Date: 12/15/2020 CLINICAL DATA:  Pacemaker infection. EXAM: CHEST - 2 VIEW COMPARISON:  Chest CT and chest x-ray 12/08/2020. Chest x-ray  04/27/2018. FINDINGS: Interval removal of cardiac pacer. Prior CABG. Stable cardiomegaly. Stable chronic interstitial changes. Stable left-sided pleural thickening. No pneumothorax. Prior cervical spine fusion. IMPRESSION: 1. Interval removal of cardiac pacer. No pneumothorax. 2. Stable cardiomegaly. Prior CABG. 3. Stable chronic interstitial changes. Electronically Signed   By: Marcello Moores  Register   On: 12/15/2020 06:19   EP PPM/ICD IMPLANT  Result Date: 12/14/2020 Conclusion: Successful extraction of a Medtronic dual-chamber pacing system in a patient with staff aureus bacteremia and evidence of pacemaker system endocarditis. Cristopher Peru, MD  [place] EKG SITE RITE  Result Date: 12/14/2020 If  Site Rite image not attached, placement could not be confirmed due to current cardiac rhythm.   Patient Profile     80 y.o. male admitted with sepsis and found to have staph bacteremia  Assessment & Plan    1.  Staph bacteremia  - He has improved with intravenous antibiotics - Now s/p PPM extraction.  - PICC line timing and ABX per ID.  2.  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   - He is now s/p PPM extraction. He is in AFL with controlled ventricular rates at this time. He will have close follow up with consideration of outpatient monitoring to assess need for re-implantation at a later date. 3.  Anticoagulation  -his heparin has been held.  - He may resume Eliquis, Saturday, 1/15 with the MORNING dose.  4.  Atrial fibrillation/flutter  - his ventricular rates have been well controlled.   EP will follow at a distance while here.   For questions or updates, please contact [place] Please consult [email or web address] for contact info under Cardiology/STEMI.  Signed, Shirley Friar, PA-C  12/15/2020, 8:07 AM   EP Attending  Patient seen and examined. Agree with the findings as noted above. The patient is doing well after PM system removal. He has had no symptomatic bradycardia. He can be discharged home with re-initiation of systemic anticoagulation as noted above.   Carleene Overlie Marx Doig,MD

## 2020-12-15 NOTE — TOC Initial Note (Signed)
Transition of Care [facility]) - Initial/Assessment Note    Patient Details  Name: Shane [name] MRN: [id number] Date of Birth: 30-Dec-1940  Transition of Care [facility]) CM/SW Contact:    Bartholomew Crews, RN Phone Number: [phone] 12/15/2020, 3:34 PM  Clinical Narrative:                  Spoke with patient and spouse at the bedside. Discussed transition plans and need for [facility] RN for IV antibiotics and HH PT. Spouse advised that patient had previous referral to [facility] and that she had received call on the day that patient came into hospital. Spoke with liaison at [facility] concerning referral for RN for IV antibiotics and PT - referral pending acceptace. Spoke with liaison at [employer] concerning need for 6 weeks IV antibiotics - anticipate PICC tomorrow followed by DC home. Patient has all needed DME at home. PCP in Epic confirmed. [facility] [place] Spouse to provide transportation home at time of discharge. TOC following for transition needs.   Expected Discharge Plan: [place] Barriers to Discharge: Continued Medical Work up   Patient Goals and CMS Choice   CMS Medicare.gov Compare Post Acute Care list provided to:: Patient Choice offered to / list presented to : [facility]  Expected Discharge Plan and Services Expected Discharge Plan: [place]   Discharge Planning Services: CM Consult Post Acute Care Choice: [place] arrangements for the past 2 months: Single Family Home                 DME Arranged: N/A DME Agency: NA       HH Arranged: Higher education careers adviser spoke with at [place]: referral pending acceptance  Prior Living Arrangements/Services Living arrangements for the past 2 months: Single Family Home Lives with:: Self,Spouse Patient language and need for interpreter reviewed:: Yes Do you feel safe going back to the place where you live?: Yes      Need for Family Participation in Patient Care: Yes  (Comment) Care giver support system in place?: Yes (comment)   Criminal Activity/Legal Involvement Pertinent to Current Situation/Hospitalization: No - Comment as needed  Activities of Daily Living Home Assistive Devices/Equipment: CBG Meter,Walker (specify type),Wheelchair,Grab bars in shower ADL Screening (condition at time of admission) Patient's cognitive ability adequate to safely complete daily activities?: Yes Is the patient deaf or have difficulty hearing?: Yes Does the patient have difficulty seeing, even when wearing glasses/contacts?: No Does the patient have difficulty concentrating, remembering, or making decisions?: No Patient able to express need for assistance with ADLs?: Yes Does the patient have difficulty dressing or bathing?: No Independently performs ADLs?: Yes (appropriate for developmental age) Does the patient have difficulty walking or climbing stairs?: No Weakness of Legs: None Weakness of Arms/Hands: None  Permission Sought/Granted Permission sought to share information with : Family Supports    Share Information with NAME: Ahkeem Goede     Permission granted to share info w Relationship: wife  Permission granted to share info w Contact Information: [phone]  Emotional Assessment Appearance:: Appears stated age Attitude/Demeanor/Rapport: Engaged Affect (typically observed): Accepting Orientation: : Oriented to Self,Oriented to Place,Oriented to  Time,Oriented to Situation Alcohol / Substance Use: Not Applicable Psych Involvement: No (comment)  Admission diagnosis:  Paroxysmal atrial fibrillation (HCC) [I48.0] Generalized weakness [R53.1] MSSA bacteremia [R78.81, B95.61] Patient Active Problem List   Diagnosis Date Noted  . Acute bacterial endocarditis   . MSSA  bacteremia 12/09/2020  . Diabetic foot ulcer ([place]) 12/09/2020  . Ulcer of right foot with necrosis of bone ([place])   . Foot drop, right   . Generalized weakness 12/08/2020  .  Dehydration with hyponatremia 12/08/2020  . Atrial fibrillation, chronic ([place]) 12/08/2020  . Elevated troponin level not due myocardial infarction 12/08/2020  . Cardiac device in situ 09/09/2020  . Coronary artery disease involving native heart without angina pectoris 07/10/2020  . Chronic kidney disease, stage 3a ([place]) 03/29/2020  . 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 ([place]) 03/16/2020  . Right epiretinal membrane 03/16/2020  . Posterior vitreous detachment of right eye 03/16/2020  . Aortic stenosis, moderate 03/12/2020  . 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 Near syncope 04/27/2018  . Hypoglycemia due to insulin 01/06/2018  . Essential hypertension 01/06/2018  . Diabetes mellitus type 2, with complication, on long term insulin pump ([place]) 01/06/2018  . Syncope and collapse 01/05/2018  . Encounter for therapeutic drug monitoring 06/29/2017  . OSA on CPAP 05/05/2017  . 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[place]) 01/05/2017  . Abnormal chest x-ray 10/12/2016  . Benign neoplasm of colon 01/14/2013   PCP:  Burnard Bunting, MD Pharmacy:   [id number] [place] [place] [address] [place] [address] Phone: [phone] Fax: [phone]  [facility] [place], [place] [address] [place] [address] Phone: [phone] Fax: [phone]     Social Determinants of Health (SDOH) Interventions    Readmission Risk Interventions No flowsheet data found.

## 2020-12-15 NOTE — Progress Notes (Signed)
Patient places self on CPAP.  RT assistance not needed at this time. 

## 2020-12-15 NOTE — Progress Notes (Signed)
PHARMACY CONSULT NOTE FOR:  OUTPATIENT  PARENTERAL ANTIBIOTIC THERAPY (OPAT)  Indication: Endocarditis Regimen: Cefazolin 2g q8hr End date: 01/25/21  IV antibiotic discharge orders are pended. To discharging provider:  please sign these orders via discharge navigator,  Select New Orders & click on the button choice - Manage This Unsigned Work.     Thank you for allowing pharmacy to be a part of this patient's care.  Esmond Plants 12/15/2020, 9:51 AM

## 2020-12-15 NOTE — Progress Notes (Signed)
Physical Therapy Treatment Patient Details Name: Shane [name] MRN: [id number] DOB: 1941/07/17 Today's Date: 12/15/2020    History of Present Illness Pt is a 80 y/o male admitted secondary to generalized weakness and chills. Found to also have a flutter. Pt with 5th metatarsal wound. Underwent aterial stents of RLE on 12/10/20. 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  Pt is s/p CABG 4 weeks ago at [facility]. PMH includes OSA on CPAP, a fib, HTN, CKD, CAD s/p pacemaker, and pulmonary fibrosis.    PT Comments    Pt making steady progress. Remains weak from decr activity and multiple medical issues. Pt eager to return home and agrees with plan for HHPT.    Follow Up Recommendations  Home health PT;Supervision for mobility/OOB     Equipment Recommendations  None recommended by PT    Recommendations for Other Services       Precautions / Restrictions Precautions Precautions: Fall;Other (comment) Precaution Comments: Maintained sternal precautions    Mobility  Bed Mobility               General bed mobility comments: Pt sitting EOB  Transfers Overall transfer level: Needs assistance Equipment used: 4-wheeled walker Transfers: Sit to/from Stand Sit to Stand: Min guard         General transfer comment: Assist for safety  Ambulation/Gait Ambulation/Gait assistance: Min Web designer (Feet): 125 Feet Assistive device: 4-wheeled walker Gait Pattern/deviations: Step-through pattern;Decreased stride length;Decreased dorsiflexion - right Gait velocity: decr Gait velocity interpretation: <1.31 ft/sec, indicative of household ambulator General Gait Details: Unsteady gait requiring min assist for balance. Standing rest break x 3   Stairs             Wheelchair Mobility    Modified Rankin (Stroke Patients Only)       Balance Overall balance assessment: Needs assistance Sitting-balance support: No upper extremity  supported;Feet supported Sitting balance-Leahy Scale: Good     Standing balance support: Bilateral upper extremity supported Standing balance-Leahy Scale: Poor Standing balance comment: rollator and min guard for static standing                            Cognition Arousal/Alertness: Awake/alert Behavior During Therapy: WFL for tasks assessed/performed Overall Cognitive Status: Within Functional Limits for tasks assessed                                        Exercises      General Comments General comments (skin integrity, edema, etc.): Amb on RA with SpO2 93% after amb      Pertinent Vitals/Pain Faces Pain Scale: Hurts a little bit Pain Location: grimace with coughing Pain Descriptors / Indicators: Grimacing;Guarding    Home Living                      Prior Function            PT Goals (current goals can now be found in the care plan section) Acute Rehab PT Goals Patient Stated Goal: return home Progress towards PT goals: Progressing toward goals    Frequency    Min 3X/week      PT Plan Current plan remains appropriate    Co-evaluation              AM-PAC PT "6 Clicks" Mobility  Outcome Measure  Help needed turning from your back to your side while in a flat bed without using bedrails?: A Little Help needed moving from lying on your back to sitting on the side of a flat bed without using bedrails?: A Little Help needed moving to and from a bed to a chair (including a wheelchair)?: A Little Help needed standing up from a chair using your arms (e.g., wheelchair or bedside chair)?: A Little Help needed to walk in hospital room?: A Little Help needed climbing 3-5 steps with a railing? : A Little 6 Click Score: 18    End of Session   Activity Tolerance: Patient limited by fatigue Patient left: in bed;with call bell/phone within reach (sitting EOB)   PT Visit Diagnosis: Unsteadiness on feet (R26.81);Muscle  weakness (generalized) (M62.81)     Time: 0165-5374 PT Time Calculation (min) (ACUTE ONLY): 20 min  Charges:  $Gait Training: 8-22 mins                     [place] Pager [phone] Office [place] 12/15/2020, 2:31 PM

## 2020-12-15 NOTE — Progress Notes (Signed)
NAME:  Shane [name], MRN:  [id number], DOB:  [birth year], LOS: 6 ADMISSION DATE:  12/08/2020, CONSULTATION DATE:  12/13/2019 REFERRING MD:  Dr Sloan Leiter, CHIEF COMPLAINT:  Cough   Brief History:  Constance Hackenberg is a 80 year old male, never smoker with idiopathic pulmonary fibrosis, coronary artery disease s/p CABG on 11/04/20, heart block s/p pacemaker placement 03/2020, CKDIIIA, and sleep apnea on CPAP who presented to [facility] on 12/08/20 with weakness and chills.   He has been found to have MSSA bacteremia and new onset atrial fibrillation/flutter. He is being treated with cefazolin IV per infectious disease. He has had increasing productive cough and has been placed on supplemental oxygen which has not required previously.  Patient reports his cough started 3 days ago and got concerning last night when he was having a difficult time coughing while on his CPAP. He is coughing up whitish yellow mucous. He denies wheezing or chest tightness. He has been started on albuterol nebulizer treatments with improvement in his cough and bringing up the mucous. He has rhinitis but denies post-nasal drainage. He reports increasing cough with eating and drinking which has been an issue in the past as he has had neck surgeries in the past.   He reports he has been walking in the hallway without desaturations below 88%. It is noted in an OT note on 12/11/19 that he maintained an oxygen saturation of 90-95% with activity.  CT Chest 12/08/2020 reviewed, no progression noted of interstitial disease compared to HRCT in 2020. No other new opacities or infiltrates.  TEE 12/11/20 reviewed, he has normal EF with moderate reduction in RV function and mild dilation in RV size.       Past Medical History:  He,  has a past medical history of AV block, Mobitz 1, Cataract, Chronic kidney disease, CKD (chronic kidney disease), stage III ([place]), Diabetes mellitus, Dyspnea, GERD (gastroesophageal reflux disease),  Hyperlipidemia, Hypertension, Idiopathic pulmonary fibrosis ([place]) (11/2016), ILD (interstitial lung disease) ([place]), Moderate aortic stenosis, Neuromuscular disorder ([place]), Nonobstructive CAD (coronary artery disease), OSA on CPAP (05/05/2017), PONV (postoperative nausea and vomiting), and Sleep apnea.   Significant Hospital Events:    Consults:  Cardiology   Procedures:  1/7 TEE   Micro Data:  12/08/20 Blood Cx MSSA 12/11/20 Blood Cx negative  Antimicrobials:  Cefazolin 1/5>>  Interim History / Subjective:  Persistent coughing A little better with Hycodan  Objective   Blood pressure (!) 123/53, pulse 63, temperature 98.1 F (36.7 C), temperature source Oral, resp. rate 20, height 6' (1.829 m), weight 117.9 kg, SpO2 100 %.        Intake/Output Summary (Last 24 hours) at 12/15/2020 1658 Last data filed at 12/15/2020 1400 Gross per 24 hour  Intake 647.17 ml  Output 600 ml  Net 47.17 ml   Filed Weights   12/13/20 0510 12/14/20 0600 12/15/20 0610  Weight: 119.2 kg 118.8 kg 117.9 kg    Examination: General: Elderly, does not appear to be in distress, sitting bedside HENT: Moist oral mucosa Lungs: Bibasal rales Cardiovascular: 1 S2 appreciated Abdomen: Soft, bowel sounds appreciated Extremities: No clubbing, no edema Neuro: Alert and oriented x3 GU:   Resolved Hospital Problem list     Assessment & Plan:  Idiopathic pulmonary fibrosis Cough/dysphagia -Supportive measures  obstructive sleep apnea -Continue CPAP therapy  Continue antitussives Added Hycodan and Phenergan  He follows up with Dr. Chase Caller was in a clinical trial, not on any medications at present continue bronchodilators flutter valve and  incentive spirometry use  Will sign off at present Schedule to see Dr. Chase Caller as outpatient in 2 to 3 months  Sherrilyn Rist, MD [place] [facility] Pager: [phone]

## 2020-12-15 NOTE — Progress Notes (Deleted)
PICC line

## 2020-12-15 NOTE — TOC Progression Note (Signed)
Transition of Care [facility]) - Progression Note    Patient Details  Name: Shane [name] MRN: [id number] Date of Birth: 10/24/1941  Transition of Care [facility]) CM/SW Contact  Bartholomew Crews, RN Phone Number: [phone] 12/15/2020, 4:40 PM  Clinical Narrative:     Damaris Schooner with Tommi Rumps at [facility] - referral accepted for PT and OT, unable to accept for nursing d/t staffing levels at this time. Spoke with Pam at [employer], she will reach out to [facility] for infusion nursing.   TOC following for transition needs.   Expected Discharge Plan: [place] Barriers to Discharge: Continued Medical Work up  Expected Discharge Plan and Services Expected Discharge Plan: [place]   Discharge Planning Services: CM Consult Post Acute Care Choice: [place] arrangements for the past 2 months: Single Family Home                 DME Arranged: N/A DME Agency: NA       HH Arranged: RN,PT,OT [place] Agency: [place] Date [place]: 12/15/20 Time [place]: 4996 Representative spoke with at [place]: Tommi Rumps at [place] at [place] ([place]) Interventions    Readmission Risk Interventions No flowsheet data found.

## 2020-12-15 NOTE — Telephone Encounter (Signed)
Pt c/o medication issue:  1. Name of Medication: apixaban (ELIQUIS) 5 MG TABS tablet  2. How are you currently taking this medication (dosage and times per day)? N/A  3. Are you having a reaction (difficulty breathing--STAT)? N/A  4. What is your medication issue? Shane [name] is calling stating Shane [name] advised her our office has coupons available for Eliquis to help them with the cost, but she has not heard anything about it since. She states Shane [name] may be discharged tomorrow and is requesting a callback to discuss the coupon further before the prescription is placed for pick up. Please advise.

## 2020-12-15 NOTE — Progress Notes (Signed)
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  Patient came to ER with severe weakness.  Admitted with new onset A-flutter, generalized weakness and malaise.  Right foot wound.  Ultimately found to have MSSA bacteremia with suspected endocarditis thought to be from right foot wound. Seen and followed by cardiology, orthopedics, vascular surgery and infectious disease. 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   Assessment & Plan:   Principal Problem:   MSSA bacteremia Active Problems:   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HCC)   OSA on CPAP   Essential hypertension   Chronic kidney disease, stage 3a (HCC)   Coronary artery disease involving native heart without angina pectoris   Cardiac device in situ   Generalized weakness   Dehydration with hyponatremia   Atrial fibrillation, chronic (HCC)   Elevated troponin level not due myocardial infarction   Diabetic foot ulcer ([place])   Acute bacterial endocarditis  New onset A-flutter/A. Fib: Rate controlled on metoprolol.  Initially on Eliquis and then on heparin.  Heparin discontinued.  Cardiology cleared to resume Eliquis on 1/15.   Generalized weakness and fatigue: Multifactorial.  Dehydration.  Infection.  AKI.  Work with PT OT.  Mobility improved.  MSSA bacteremia, likely related to chronic right  foot diabetic wound, endocarditis: 1/4, blood culture positive for MSSA 1/7, blood cultures negative so far TEE 1/7, questionable aortic valve endocarditis.  No aortic regurgitation. Pacemaker removed on 1/10. CT scan of the right foot wound with no evidence of abscess or osteomyelitis.  Surgery recommended conservative management.  Status post aortogram, right lower extremity angiogram, right superficial femoral and above-knee popliteal drug-eluting stent placement.  Currently on aspirin, Plavix and Eliquis. Ordered PICC line.  Cefazolin Home infusion therapy for 6 weeks since removal of pacemaker 1/10-2/21.  Home health arrangements to be done.  Type 2 diabetes with renal complications, peripheral neuropathy and hypoglycemia: Blood sugars better now on reduced dose of insulin.  Continue current doses.  No more episodes of hypoglycemia.  CAD status post CABG: No anginal symptoms.  Currently remains on aspirin, Lipitor and metoprolol.  OSA on CPAP: Continue CPAP.  AKI with underlying history of stage IIIa chronic kidney disease: Noted baseline creatinine about 1.2-1.5.  Remains at about baseline. Recheck renal function tomorrow morning.  Anemia of chronic disease.  Stable.    Interstitial fibrosis: With risk of pulmonary complications.  Followed by pulmonary.  Continue bronchodilators, incentive spirometry and flutter valve therapy.  Mobilize.  Seen by pulmonary.  Do not think he has any exacerbation of underlying condition.  Buttock ulcers present on admission: Keep off pressure.  Xeroform dressing.   DVT prophylaxis: SCDs Start: 12/14/20 1748 SCD's Start: 12/10/20 2311, heparin infusion   Code Status: Full code Family Communication: Wife at the bedside  Disposition Plan: Status is: Inpatient  Remains inpatient appropriate because:Inpatient level of care appropriate due to severity of illness   Dispo: The patient is from: Home  Anticipated d/c is to: Home with home  health.              Anticipated d/c date is: Architectural technologist.  After PICC line placement.              Patient currently is not medically stable to d/c.   Consultants:   Cardiology  Infectious disease  Vascular surgery  Orthopedic  Procedures:   TEE 1/7  Right lower extremity bypass 1/6  Pacemaker removal on 1/10.  Antimicrobials:  Anti-infectives (From admission, onward)   Start     Dose/Rate Route Frequency Ordered Stop   12/14/20 1400  gentamicin (GARAMYCIN) 80 mg in sodium chloride 0.9 % 500 mL irrigation        80 mg Irrigation On call 12/13/20 1704 12/14/20 1655   12/14/20 1400  ceFAZolin (ANCEF) IVPB 2g/100 mL premix        2 g 200 mL/hr over 30 Minutes Intravenous On call 12/13/20 1704 12/14/20 1915   12/14/20 0600  vancomycin (VANCOREADY) IVPB 1500 mg/300 mL  Status:  Discontinued        1,500 mg 150 mL/hr over 120 Minutes Intravenous On call to O.R. 12/13/20 1549 12/14/20 1739   12/09/20 1430  ceFAZolin (ANCEF) IVPB 2g/100 mL premix        2 g 200 mL/hr over 30 Minutes Intravenous Every 8 hours 12/09/20 1418           Subjective: Patient seen and examined.  No overnight events.  Breathing better.  Less cough.  Up and about and walking. Telemetry shows rate controlled a flutter.  Objective: Vitals:   12/14/20 2356 12/15/20 0610 12/15/20 0618 12/15/20 0903  BP: (!) 112/57  (!) 145/50 (!) 123/53  Pulse: 62  61 63  Resp: 17  20 20   Temp: 98.6 F (37 C)  98.1 F (36.7 C)   TempSrc: Oral  Oral   SpO2: 94%  99% 100%  Weight:  117.9 kg    Height:        Intake/Output Summary (Last 24 hours) at 12/15/2020 1140 Last data filed at 12/15/2020 0900 Gross per 24 hour  Intake 407.17 ml  Output 200 ml  Net 207.17 ml   Filed Weights   12/13/20 0510 12/14/20 0600 12/15/20 0610  Weight: 119.2 kg 118.8 kg 117.9 kg    Examination:  General exam: Appears calm and comfortable  Currently adequate on room air. Sitting at the edge of the bed.  Looks  comfortable. Respiratory system: Mostly clear.  No added sounds. Cardiovascular system: S1 & S2 heard, irregularly irregular.  Pacemaker removal incision clean and dry.  Gastrointestinal system: Soft.  Nontender.  Bowel sounds present.  Obese and pendulous. Central nervous system: Alert and oriented. No focal neurological deficits. Extremities: Symmetric 5 x 5 power. Skin:  Sternotomy wound is healing. He has 2 small wound on his buttock cleft clean and dry. Right foot lateral wound, superficial erythema and drain present. No expressible pus or underlying collection.   Data Reviewed: I have personally reviewed following labs and imaging studies  CBC: Recent Labs  Lab 12/08/20 1805 12/09/20 0304 12/10/20 0354 12/11/20 0436 12/12/20 0203 12/13/20 0145 12/14/20 0222 12/15/20 0257  WBC 10.2 8.2   < > 10.4 9.1 9.0 9.4 9.2  NEUTROABS 7.3 5.4  --   --   --   --   --   --   HGB 11.9* 10.7*   < > 9.1* 9.3* 8.8* 9.1* 8.4*  HCT 35.8* 32.4*   < >  28.1* 27.5* 27.1* 28.0* 25.7*  MCV 95.0 94.5   < > 94.6 93.9 93.8 94.6 94.5  PLT 257 218   < > 174 168 160 203 204   < > = values in this interval not displayed.   Basic Metabolic Panel: Recent Labs  Lab 12/08/20 2045 12/09/20 0304 12/10/20 0354 12/11/20 0436 12/12/20 0203 12/15/20 0257  NA  --  129* 131* 129* 134* 132*  K  --  4.6 4.2 3.9 4.2 4.6  CL  --  93* 96* 99 102 100  CO2  --  24 25 21* 23 23  GLUCOSE  --  153* 70 114* 162* 147*  BUN  --  37* 31* 28* 31* 28*  CREATININE  --  1.75* 1.78* 1.55* 1.52* 1.41*  CALCIUM  --  8.4* 7.9* 7.4* 7.8* 8.0*  MG 1.7 1.9  --   --   --   --    GFR: Estimated Creatinine Clearance: 56.3 mL/min (A) (by C-G formula based on SCr of 1.41 mg/dL (H)). Liver Function Tests: Recent Labs  Lab 12/08/20 1805 12/09/20 0304  AST 20 19  ALT 19 17  ALKPHOS 90 80  BILITOT 1.2 1.0  PROT 7.3 6.5  ALBUMIN 3.0* 2.5*   No results for input(s): LIPASE, AMYLASE in the last 168 hours. No results for  input(s): AMMONIA in the last 168 hours. Coagulation Profile: No results for input(s): INR, PROTIME in the last 168 hours. Cardiac Enzymes: No results for input(s): CKTOTAL, CKMB, CKMBINDEX, TROPONINI in the last 168 hours. BNP (last 3 results) No results for input(s): PROBNP in the last 8760 hours. HbA1C: No results for input(s): HGBA1C in the last 72 hours. CBG: Recent Labs  Lab 12/14/20 1743 12/14/20 2031 12/14/20 2320 12/14/20 2355 12/15/20 0804  GLUCAP 103* 192* 187* 177* 115*   Lipid Profile: No results for input(s): CHOL, HDL, LDLCALC, TRIG, CHOLHDL, LDLDIRECT in the last 72 hours. Thyroid Function Tests: No results for input(s): TSH, T4TOTAL, FREET4, T3FREE, THYROIDAB in the last 72 hours. Anemia Panel: No results for input(s): VITAMINB12, FOLATE, FERRITIN, TIBC, IRON, RETICCTPCT in the last 72 hours. Sepsis Labs: Recent Labs  Lab 12/08/20 1805 12/08/20 2045  LATICACIDVEN 2.1* 1.5    Recent Results (from the past 240 hour(s))  Blood culture (routine x 2)     Status: Abnormal   Collection Time: 12/08/20  6:05 PM   Specimen: BLOOD  Result Value Ref Range Status   Specimen Description BLOOD RIGHT ANTECUBITAL  Final   Special Requests   Final    BOTTLES DRAWN AEROBIC AND ANAEROBIC Blood Culture adequate volume   Culture  Setup Time   Final    GRAM POSITIVE COCCI IN CLUSTERS IN BOTH AEROBIC AND ANAEROBIC BOTTLES Organism ID to follow CRITICAL RESULT CALLED TO, READ BACK BY AND VERIFIED WITH: A WOLFE PHARMD 1407 12/09/20 A BROWNING Performed at [facility], [place] [address], [place], [place] [address]    Culture STAPHYLOCOCCUS AUREUS (A)  Final   Report Status 12/12/2020 FINAL  Final   Organism ID, Bacteria STAPHYLOCOCCUS AUREUS  Final      Susceptibility   Staphylococcus aureus - MIC*    CIPROFLOXACIN <=0.5 SENSITIVE Sensitive     ERYTHROMYCIN <=0.25 SENSITIVE Sensitive     GENTAMICIN <=0.5 SENSITIVE Sensitive     OXACILLIN 0.5 SENSITIVE Sensitive      TETRACYCLINE <=1 SENSITIVE Sensitive     VANCOMYCIN 1 SENSITIVE Sensitive     TRIMETH/SULFA <=10 SENSITIVE Sensitive  CLINDAMYCIN <=0.25 SENSITIVE Sensitive     RIFAMPIN <=0.5 SENSITIVE Sensitive     Inducible Clindamycin NEGATIVE Sensitive     * STAPHYLOCOCCUS AUREUS  Blood culture (routine x 2)     Status: Abnormal   Collection Time: 12/08/20  6:05 PM   Specimen: BLOOD  Result Value Ref Range Status   Specimen Description BLOOD LEFT ANTECUBITAL  Final   Special Requests   Final    BOTTLES DRAWN AEROBIC AND ANAEROBIC Blood Culture adequate volume   Culture  Setup Time   Final    GRAM POSITIVE COCCI IN CLUSTERS IN BOTH AEROBIC AND ANAEROBIC BOTTLES CRITICAL VALUE NOTED.  VALUE IS CONSISTENT WITH PREVIOUSLY REPORTED AND CALLED VALUE.    Culture (A)  Final    STAPHYLOCOCCUS AUREUS SUSCEPTIBILITIES PERFORMED ON PREVIOUS CULTURE WITHIN THE LAST 5 DAYS. Performed at [facility], [place] [address] [place], [place] [address]    Report Status 12/12/2020 FINAL  Final  Resp Panel by RT-PCR (Flu A&B, Covid) Nasopharyngeal Swab     Status: None   Collection Time: 12/08/20  6:05 PM   Specimen: Nasopharyngeal Swab; Nasopharyngeal(NP) swabs in vial transport medium  Result Value Ref Range Status   SARS Coronavirus 2 by RT PCR NEGATIVE NEGATIVE Final    Comment: (NOTE) SARS-CoV-2 target nucleic acids are NOT DETECTED.  The SARS-CoV-2 RNA is generally detectable in upper respiratory specimens during the acute phase of infection. The lowest concentration of SARS-CoV-2 viral copies this assay can detect is 138 copies/mL. A negative result does not preclude SARS-Cov-2 infection and should not be used as the sole basis for treatment or other patient management decisions. A negative result may occur with  improper specimen collection/handling, submission of specimen other than nasopharyngeal swab, presence of viral mutation(s) within the areas targeted by this assay, and inadequate number  of viral copies(<138 copies/mL). A negative result must be combined with clinical observations, patient history, and epidemiological information. The expected result is Negative.  Fact Sheet for Patients:  EntrepreneurPulse.com.au  Fact Sheet for Healthcare Providers:  IncredibleEmployment.be  This test is no t yet approved or cleared by the [place] FDA and  has been authorized for detection and/or diagnosis of SARS-CoV-2 by FDA under an Emergency Use Authorization (EUA). This EUA will remain  in effect (meaning this test can be used) for the duration of the COVID-19 declaration under Section 564(b)(1) of the Act, 21 U.S.C.section 360bbb-3(b)(1), unless the authorization is terminated  or revoked sooner.       Influenza A by PCR NEGATIVE NEGATIVE Final   Influenza B by PCR NEGATIVE NEGATIVE Final    Comment: (NOTE) The Xpert Xpress SARS-CoV-2/FLU/RSV plus assay is intended as an aid in the diagnosis of influenza from Nasopharyngeal swab specimens and should not be used as a sole basis for treatment. Nasal washings and aspirates are unacceptable for Xpert Xpress SARS-CoV-2/FLU/RSV testing.  Fact Sheet for Patients: EntrepreneurPulse.com.au  Fact Sheet for Healthcare Providers: IncredibleEmployment.be  This test is not yet approved or cleared by the [place] FDA and has been authorized for detection and/or diagnosis of SARS-CoV-2 by FDA under an Emergency Use Authorization (EUA). This EUA will remain in effect (meaning this test can be used) for the duration of the COVID-19 declaration under Section 564(b)(1) of the Act, 21 U.S.C. section 360bbb-3(b)(1), unless the authorization is terminated or revoked.  Performed at [facility], [place] [address] [place], [place] [address]   Blood Culture ID Panel (Reflexed)  Status: Abnormal   Collection Time: 12/08/20  6:05 PM  Result Value Ref  Range Status   Enterococcus faecalis NOT DETECTED NOT DETECTED Final   Enterococcus Faecium NOT DETECTED NOT DETECTED Final   Listeria monocytogenes NOT DETECTED NOT DETECTED Final   Staphylococcus species DETECTED (A) NOT DETECTED Final    Comment: CRITICAL RESULT CALLED TO, READ BACK BY AND VERIFIED WITH: A WOLFE PHARMD 1407 12/09/20 A BROWNING    Staphylococcus aureus (BCID) DETECTED (A) NOT DETECTED Final    Comment: CRITICAL RESULT CALLED TO, READ BACK BY AND VERIFIED WITH: A WOLFE PHARMD 1407 12/09/20 A BROWNING    Staphylococcus epidermidis NOT DETECTED NOT DETECTED Final   Staphylococcus lugdunensis NOT DETECTED NOT DETECTED Final   Streptococcus species NOT DETECTED NOT DETECTED Final   Streptococcus agalactiae NOT DETECTED NOT DETECTED Final   Streptococcus pneumoniae NOT DETECTED NOT DETECTED Final   Streptococcus pyogenes NOT DETECTED NOT DETECTED Final   A.calcoaceticus-baumannii NOT DETECTED NOT DETECTED Final   Bacteroides fragilis NOT DETECTED NOT DETECTED Final   Enterobacterales NOT DETECTED NOT DETECTED Final   Enterobacter cloacae complex NOT DETECTED NOT DETECTED Final   Escherichia coli NOT DETECTED NOT DETECTED Final   Klebsiella aerogenes NOT DETECTED NOT DETECTED Final   Klebsiella oxytoca NOT DETECTED NOT DETECTED Final   Klebsiella pneumoniae NOT DETECTED NOT DETECTED Final   Proteus species NOT DETECTED NOT DETECTED Final   Salmonella species NOT DETECTED NOT DETECTED Final   Serratia marcescens NOT DETECTED NOT DETECTED Final   Haemophilus influenzae NOT DETECTED NOT DETECTED Final   Neisseria meningitidis NOT DETECTED NOT DETECTED Final   Pseudomonas aeruginosa NOT DETECTED NOT DETECTED Final   Stenotrophomonas maltophilia NOT DETECTED NOT DETECTED Final   Candida albicans NOT DETECTED NOT DETECTED Final   Candida auris NOT DETECTED NOT DETECTED Final   Candida glabrata NOT DETECTED NOT DETECTED Final   Candida krusei NOT DETECTED NOT DETECTED Final    Candida parapsilosis NOT DETECTED NOT DETECTED Final   Candida tropicalis NOT DETECTED NOT DETECTED Final   Cryptococcus neoformans/gattii NOT DETECTED NOT DETECTED Final   Meth resistant mecA/C and MREJ NOT DETECTED NOT DETECTED Final    Comment: Performed at [facility], [address] [place], [place] [address]  Culture, blood (routine x 2)     Status: None (Preliminary result)   Collection Time: 12/11/20  4:36 AM   Specimen: BLOOD LEFT HAND  Result Value Ref Range Status   Specimen Description BLOOD LEFT HAND  Final   Special Requests   Final    BOTTLES DRAWN AEROBIC AND ANAEROBIC Blood Culture adequate volume   Culture   Final    NO GROWTH 4 DAYS Performed at [facility], [place] [address] [place], [place] [address]    Report Status PENDING  Incomplete  Culture, blood (routine x 2)     Status: None (Preliminary result)   Collection Time: 12/11/20  4:37 AM   Specimen: BLOOD RIGHT HAND  Result Value Ref Range Status   Specimen Description BLOOD RIGHT HAND  Final   Special Requests   Final    BOTTLES DRAWN AEROBIC AND ANAEROBIC Blood Culture adequate volume   Culture   Final    NO GROWTH 4 DAYS Performed at [facility], [place] [address] [place], [place] [address]    Report Status PENDING  Incomplete  MRSA PCR Screening     Status: None   Collection Time: 12/13/20  3:06 PM  Specimen: Nasal Mucosa; Nasopharyngeal  Result Value Ref Range Status   MRSA by PCR NEGATIVE NEGATIVE Final    Comment:        The GeneXpert MRSA Assay (FDA approved for NASAL specimens only), is one component of a comprehensive MRSA colonization surveillance program. It is not intended to diagnose MRSA infection nor to guide or monitor treatment for MRSA infections. Performed at [facility], [place] [address] [place], [place] [address]          Radiology Studies: DG Chest 2 View  Result Date: 12/15/2020 CLINICAL DATA:  Pacemaker infection. EXAM: CHEST - 2 VIEW  COMPARISON:  Chest CT and chest x-ray 12/08/2020. Chest x-ray 04/27/2018. FINDINGS: Interval removal of cardiac pacer. Prior CABG. Stable cardiomegaly. Stable chronic interstitial changes. Stable left-sided pleural thickening. No pneumothorax. Prior cervical spine fusion. IMPRESSION: 1. Interval removal of cardiac pacer. No pneumothorax. 2. Stable cardiomegaly. Prior CABG. 3. Stable chronic interstitial changes. Electronically Signed   By: Marcello Moores  Register   On: 12/15/2020 06:19   EP PPM/ICD IMPLANT  Result Date: 12/14/2020 Conclusion: Successful extraction of a [employer] dual-chamber pacing system in a patient with staff aureus bacteremia and evidence of pacemaker system endocarditis. Cristopher Peru, MD  [place] EKG SITE RITE  Result Date: 12/14/2020 If Site Rite image not attached, placement could not be confirmed due to current cardiac rhythm.       Scheduled Meds: . [START ON 12/19/2020] apixaban  5 mg Oral BID  . aspirin EC  81 mg Oral Daily  . atorvastatin  40 mg Oral Daily  . furosemide  40 mg Oral Daily  . HYDROcodone-homatropine  5 mL Oral Once  . insulin aspart  0-9 Units Subcutaneous TID WC  . insulin aspart  7 Units Subcutaneous TID WC  . insulin detemir  15 Units Subcutaneous QHS  . metoprolol tartrate  25 mg Oral BID  . multivitamin with minerals  1 tablet Oral Daily  . pantoprazole  40 mg Oral Daily  . Ensure Max Protein  11 oz Oral BID  . sodium chloride flush  3 mL Intravenous Q12H   Continuous Infusions: . sodium chloride 250 mL (12/14/20 2330)  .  ceFAZolin (ANCEF) IV 2 g (12/15/20 0659)     LOS: 6 days    Time spent: 30 minutes    Barb Merino, MD [facility] Pager [phone]

## 2020-12-15 NOTE — Progress Notes (Signed)
  Speech Language Pathology Treatment: Dysphagia  Patient Details Name: Shane [name] MRN: [id number] DOB: [birth year] Today's Date: 12/15/2020 Time: 5369-2230 SLP Time Calculation (min) (ACUTE ONLY): 15 min  Assessment / Plan / Recommendation Clinical Impression  Pt was seen for f/u, still presenting with what appears to be a primary esophageal dysphagia given reports of difficulty with solids > liquids and delayed coughing after PO intake. He denies any acute changes in his swallowing, saying that he has had these mild issues more chronically. He does subjectively believe that his symptoms have improved since he has been staying more upright after his meals. Pt continues to decline any additional swallowing testing at this time. Education was reinforced about precautions and benefit of additional testing, even if he wants to pursue any after discharge. May want to consider a dedicated esophageal test. SLP to sign off acutely but please reorder if he is interested in [facility]. Pt and wife in agreement with plan.    HPI HPI: [name], never smoker with idiopathic pulmonary fibrosis, GERD, CAD s/p CABG on 11/04/20, heart block s/p pacemaker placement 03/2020, CKDIIIA, and sleep apnea on CPAP who presented to [facility] on 12/08/20 with weakness and chills, found to have MSSA Bacteremia from lower extremity wound.      SLP Plan  All goals met       Recommendations  Diet recommendations: Regular;Thin liquid Liquids provided via: Cup;Straw Medication Administration: Whole meds with liquid Supervision: Patient able to self feed;Intermittent supervision to cue for compensatory strategies Compensations: Slow rate;Small sips/bites;Follow solids with liquid Postural Changes and/or Swallow Maneuvers: Seated upright 90 degrees;Upright 30-60 min after meal                Oral Care Recommendations: Oral care BID Follow up Recommendations: None SLP Visit Diagnosis: Dysphagia,  unspecified (R13.10) Plan: All goals met       GO                Osie Bond., M.A. [facility] Pager [phone] Office [phone]  12/15/2020, 3:46 PM

## 2020-12-16 ENCOUNTER — Encounter: Payer: Medicare Other | Admitting: Physician Assistant

## 2020-12-16 ENCOUNTER — Inpatient Hospital Stay (HOSPITAL_COMMUNITY): Payer: Medicare Other

## 2020-12-16 DIAGNOSIS — R7881 Bacteremia: Secondary | ICD-10-CM | POA: Diagnosis not present

## 2020-12-16 DIAGNOSIS — I482 Chronic atrial fibrillation, unspecified: Secondary | ICD-10-CM | POA: Diagnosis not present

## 2020-12-16 DIAGNOSIS — Z959 Presence of cardiac and vascular implant and graft, unspecified: Secondary | ICD-10-CM | POA: Diagnosis not present

## 2020-12-16 DIAGNOSIS — I330 Acute and subacute infective endocarditis: Secondary | ICD-10-CM | POA: Diagnosis not present

## 2020-12-16 LAB — CBC WITH DIFFERENTIAL/PLATELET
Abs Immature Granulocytes: 0.08 10*3/uL — ABNORMAL HIGH (ref 0.00–0.07)
Basophils Absolute: 0.1 10*3/uL (ref 0.0–0.1)
Basophils Relative: 1 %
Eosinophils Absolute: 0.4 10*3/uL (ref 0.0–0.5)
Eosinophils Relative: 4 %
HCT: 26.9 % — ABNORMAL LOW (ref 39.0–52.0)
Hemoglobin: 8.8 g/dL — ABNORMAL LOW (ref 13.0–17.0)
Immature Granulocytes: 1 %
Lymphocytes Relative: 14 %
Lymphs Abs: 1.1 10*3/uL (ref 0.7–4.0)
MCH: 31.1 pg (ref 26.0–34.0)
MCHC: 32.7 g/dL (ref 30.0–36.0)
MCV: 95.1 fL (ref 80.0–100.0)
Monocytes Absolute: 1 10*3/uL (ref 0.1–1.0)
Monocytes Relative: 12 %
Neutro Abs: 5.4 10*3/uL (ref 1.7–7.7)
Neutrophils Relative %: 68 %
Platelets: 242 10*3/uL (ref 150–400)
RBC: 2.83 MIL/uL — ABNORMAL LOW (ref 4.22–5.81)
RDW: 13.3 % (ref 11.5–15.5)
WBC: 7.9 10*3/uL (ref 4.0–10.5)
nRBC: 0 % (ref 0.0–0.2)

## 2020-12-16 LAB — CULTURE, BLOOD (ROUTINE X 2)
Culture: NO GROWTH
Culture: NO GROWTH
Special Requests: ADEQUATE
Special Requests: ADEQUATE

## 2020-12-16 LAB — GLUCOSE, CAPILLARY
Glucose-Capillary: 108 mg/dL — ABNORMAL HIGH (ref 70–99)
Glucose-Capillary: 113 mg/dL — ABNORMAL HIGH (ref 70–99)
Glucose-Capillary: 132 mg/dL — ABNORMAL HIGH (ref 70–99)
Glucose-Capillary: 111 mg/dL — ABNORMAL HIGH (ref 70–99)
Glucose-Capillary: 183 mg/dL — ABNORMAL HIGH (ref 70–99)

## 2020-12-16 LAB — COMPREHENSIVE METABOLIC PANEL
ALT: 10 U/L (ref 0–44)
AST: 20 U/L (ref 15–41)
Albumin: 2 g/dL — ABNORMAL LOW (ref 3.5–5.0)
Alkaline Phosphatase: 77 U/L (ref 38–126)
Anion gap: 9 (ref 5–15)
BUN: 31 mg/dL — ABNORMAL HIGH (ref 8–23)
CO2: 26 mmol/L (ref 22–32)
Calcium: 8 mg/dL — ABNORMAL LOW (ref 8.9–10.3)
Chloride: 99 mmol/L (ref 98–111)
Creatinine, Ser: 1.37 mg/dL — ABNORMAL HIGH (ref 0.61–1.24)
GFR, Estimated: 52 mL/min — ABNORMAL LOW (ref >60)
Glucose, Bld: 169 mg/dL — ABNORMAL HIGH (ref 70–99)
Potassium: 4.3 mmol/L (ref 3.5–5.1)
Sodium: 134 mmol/L — ABNORMAL LOW (ref 135–145)
Total Bilirubin: 0.5 mg/dL (ref 0.3–1.2)
Total Protein: 6.1 g/dL — ABNORMAL LOW (ref 6.5–8.1)

## 2020-12-16 LAB — MAGNESIUM: Magnesium: 1.7 mg/dL (ref 1.7–2.4)

## 2020-12-16 LAB — PHOSPHORUS: Phosphorus: 2.9 mg/dL (ref 2.5–4.6)

## 2020-12-16 MED ORDER — INSULIN LISPRO 100 UNIT/ML ~~LOC~~ SOLN: 7.0000 [IU] | Freq: Three times a day (TID) | SUBCUTANEOUS | 0 refills | Status: DC

## 2020-12-16 MED ORDER — CEFAZOLIN IV (FOR PTA / DISCHARGE USE ONLY): 2.0000 g | Freq: Three times a day (TID) | INTRAVENOUS | 0 refills | Status: AC

## 2020-12-16 MED ORDER — TRESIBA FLEXTOUCH 200 UNIT/ML ~~LOC~~ SOPN: 15.0000 [IU] | PEN_INJECTOR | Freq: Every day | SUBCUTANEOUS | Status: DC

## 2020-12-16 MED ORDER — CHLORHEXIDINE GLUCONATE CLOTH 2 % EX PADS
6.0000 | MEDICATED_PAD | Freq: Every day | CUTANEOUS | Status: DC
  Administered 2020-12-16: 6 via TOPICAL

## 2020-12-16 MED ORDER — SODIUM CHLORIDE 0.9% FLUSH
10.0000 mL | INTRAVENOUS | Status: DC | PRN
Start: 2020-12-16 — End: 2020-12-16

## 2020-12-16 MED ORDER — ENSURE MAX PROTEIN PO LIQD: 11.0000 [oz_av] | Freq: Two times a day (BID) | ORAL | 0 refills | Status: DC

## 2020-12-16 NOTE — Telephone Encounter (Signed)
I consulted case management to take at look at him and make sure he doesn't have any other med needs.   Thank yall!

## 2020-12-16 NOTE — Progress Notes (Signed)
Physical Therapy Treatment Patient Details Name: Shane [name] MRN: [id number] DOB: October 01, 1941 Today's Date: 12/16/2020    History of Present Illness Pt is a 80 y/o male admitted secondary to generalized weakness and chills. Found to also have a flutter. Pt with 5th metatarsal wound. Underwent aterial stents of RLE on 12/10/20. 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  Pt is s/p CABG 4 weeks ago at [facility]. PMH includes OSA on CPAP, a fib, HTN, CKD, CAD s/p pacemaker, and pulmonary fibrosis.    PT Comments    Pt progressing towards his physical therapy goals. Ambulating 150 feet with a Rollator at a min assist level. SpO2 94-96% on RA, HR 87-123 bpm, BP 140/55 post mobility. Pt continues with static/dynamic balance deficits and decreased endurance. Education provided regarding daily foot inspections and proper footwear in light of decreased sensation. D/c plan remains appropriate.     Follow Up Recommendations  Home health PT;Supervision for mobility/OOB     Equipment Recommendations  None recommended by PT    Recommendations for Other Services       Precautions / Restrictions Precautions Precautions: Fall;Sternal Restrictions Weight Bearing Restrictions: No    Mobility  Bed Mobility Overal bed mobility: Modified Independent                Transfers Overall transfer level: Needs assistance Equipment used: 4-wheeled walker Transfers: Sit to/from Stand Sit to Stand: Min guard         General transfer comment: Assist for safety  Ambulation/Gait Ambulation/Gait assistance: Min assist Gait Distance (Feet): 150 Feet Assistive device: 4-wheeled walker Gait Pattern/deviations: Step-through pattern;Decreased stride length;Decreased dorsiflexion - right;Drifts right/left Gait velocity: decr   General Gait Details: Pt requiring minA for stability, able to self cue to stop to regain balance, decreased right heel strike and clearance  (baseline)   Stairs             Wheelchair Mobility    Modified Rankin (Stroke Patients Only)       Balance Overall balance assessment: Needs assistance Sitting-balance support: No upper extremity supported;Feet supported Sitting balance-Leahy Scale: Good     Standing balance support: Bilateral upper extremity supported Standing balance-Leahy Scale: Poor Standing balance comment: rollator and min guard for static standing                            Cognition Arousal/Alertness: Awake/alert Behavior During Therapy: WFL for tasks assessed/performed Overall Cognitive Status: Within Functional Limits for tasks assessed                                        Exercises General Exercises - Lower Extremity Long Arc Quad: Both;10 reps;Seated Hip Flexion/Marching: Both;5 reps;Seated    General Comments        Pertinent Vitals/Pain Pain Assessment: Faces Faces Pain Scale: No hurt    Home Living                      Prior Function            PT Goals (current goals can now be found in the care plan section) Acute Rehab PT Goals Patient Stated Goal: return home Potential to Achieve Goals: Good Progress towards PT goals: Progressing toward goals    Frequency    Min 3X/week  PT Plan Current plan remains appropriate    Co-evaluation              AM-PAC PT "6 Clicks" Mobility   Outcome Measure  Help needed turning from your back to your side while in a flat bed without using bedrails?: None Help needed moving from lying on your back to sitting on the side of a flat bed without using bedrails?: None Help needed moving to and from a bed to a chair (including a wheelchair)?: A Little Help needed standing up from a chair using your arms (e.g., wheelchair or bedside chair)?: A Little Help needed to walk in hospital room?: A Little Help needed climbing 3-5 steps with a railing? : A Little 6 Click Score: 20     End of Session Equipment Utilized During Treatment: Gait belt Activity Tolerance: Patient tolerated treatment well Patient left: in bed;with call bell/phone within reach Nurse Communication: Mobility status PT Visit Diagnosis: Unsteadiness on feet (R26.81);Muscle weakness (generalized) (M62.81)     Time: 4604-7998 PT Time Calculation (min) (ACUTE ONLY): 24 min  Charges:  $Therapeutic Activity: 23-37 mins                     Wyona Almas, PT, DPT Acute Rehabilitation Services Pager [phone] Office [phone]    Deno Etienne 12/16/2020, 10:52 AM

## 2020-12-16 NOTE — Discharge Summary (Addendum)
Physician Discharge Summary  Juris Gosnell Magoon VZD:[id number] DOB: 05-18-1941 DOA: 12/08/2020  PCP: Burnard Bunting, MD  Admit date: 12/08/2020 Discharge date: 12/16/2020  Admitted From: Home Disposition: Home with Home Health  Recommendations for Outpatient Follow-up:  1. Follow up with PCP in 1-2 weeks 2. Follow up with Cardiology within 1-2 weeks; 3. Follow up with Pulmonary Dr. Vaughan Browner at Discharge 4. Resume Eliquis 12/20/19 per Cardiology; Notified patient and instructed to take it Saturday and he understands and agrees 5. Please obtain CMP/CBC, Mag, Phos in one week 6. 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  Patient came to ER with severe weakness.  Admitted with new onset A-flutter, generalized weakness and malaise.  Right foot wound.  Ultimately found to have MSSA bacteremia with suspected endocarditis thought to be from right foot wound initially but then could have been from his Pacemaker. Seen and followed by cardiology, orthopedics, vascular surgery and infectious disease. 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 PICC Line placed on 12/16/20 and Stable for D/C after 6 weeks of Abx. He is to start Eliquis 5 mg BID on 12/19/20    Discharge Diagnoses:  Principal Problem:   MSSA bacteremia Active Problems:   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HCC)   OSA on CPAP   Essential hypertension   Chronic kidney disease, stage 3a (HCC)   Coronary artery disease involving native heart without angina pectoris   Cardiac device in situ   Generalized weakness   Dehydration with hyponatremia   Atrial fibrillation, chronic (HCC)   Elevated troponin level not due myocardial infarction   Diabetic foot ulcer ([place])   Acute bacterial endocarditis  New onset A-flutter/A. Fib:  Rate controlled on metoprolol.  Initially on Eliquis and then on heparin.  Heparin discontinued.  Cardiology cleared to resume Eliquis on 1/15.   Generalized weakness and fatigue:  Multifactorial.  Dehydration.  Infection.  AKI.  Work with PT OT.  Mobility improved.  MSSA bacteremia, likely related to chronic right foot diabetic wound, endocarditis, Sepsis Ruled Out: 1/4, blood culture positive for MSSA 1/7, blood cultures negative so far TEE 1/7, questionable aortic valve endocarditis.  No aortic regurgitation. Pacemaker removed on 1/10. CT scan of the right foot wound with no evidence of abscess or osteomyelitis.  Surgery recommended conservative management.  Status post aortogram, right lower extremity angiogram, right superficial femoral and above-knee popliteal drug-eluting stent placement.  Currently on aspirin, Plavix and Eliquis (To be started Saturday). Ordered PICC line.  Cefazolin Home infusion therapy for 6 weeks since removal of pacemaker 1/10-2/21.  Home health arrangements to be done.  Type 2 diabetes with renal complications, peripheral neuropathy and hypoglycemia: Blood sugars better now on reduced dose of insulin.  Continue current doses and adjusted for D/C.  No more episodes of hypoglycemia.  CAD status post CABG: No anginal symptoms.  Currently remains on  aspirin, Lipitor and metoprolol.  OSA on CPAP:  -Continue  CPAP.  AKI with underlying history of stage IIIa chronic kidney disease: Noted baseline creatinine about 1.2-1.5.  Remains at about baseline. Recheck renal function tomorrow morning.  Anemia of chronic disease.   -Stable.    Interstitial fibrosis:  With risk of pulmonary complications.  Followed by pulmonary.  Continue bronchodilators, incentive spirometry and flutter valve therapy.  Mobilize.  Seen by pulmonary.  Do not think he has any exacerbation of underlying condition.  Buttock ulcers present on admission:  Keep off pressure.  Xeroform dressing.  Obesity -Complicates overall prognosis and care -Estimated body mass index is 35.57 kg/m as calculated from the following:   Height as of this encounter: 6' (1.829 m).   Weight as of this encounter: 119 kg. -Weight Loss and Dietary Counseling given  Discharge Instructions  Discharge Instructions    (HEART FAILURE PATIENTS) Call MD:  Anytime you have any of the following symptoms: 1) 3 pound weight gain in 24 hours or 5 pounds in 1 week 2) shortness of breath, with or without a dry hacking cough 3) swelling in the hands, feet or stomach 4) if you have to sleep on extra pillows at night in order to breathe.   Complete by: As directed    [facility] pharmacist to adjust dose for Vancomycin, Aminoglycosides and other anti-infective therapies as requested by physician.   Complete by: As directed    [facility] to provide Cath Flo 2mg    Complete by: As directed    Administer for PICC line occlusion and as ordered by physician for other access device issues.   Anaphylaxis Kit: Provided to treat any anaphylactic reaction to the medication being provided to the patient if First Dose or when requested by physician   Complete by: As directed    Epinephrine 1mg /ml vial / amp: Administer 0.3mg  (0.24ml) subcutaneously once for moderate to severe anaphylaxis, nurse to call physician and pharmacy when reaction occurs and  call 911 if needed for immediate care   Diphenhydramine 50mg /ml IV vial: Administer 25-50mg  IV/IM PRN for first dose reaction, rash, itching, mild reaction, nurse to call physician and pharmacy when reaction occurs   Sodium Chloride 0.9% NS 58ml IV: Administer if needed for hypovolemic blood pressure drop or as ordered by physician after call to physician with anaphylactic reaction   Call MD for:  difficulty breathing, headache or visual disturbances   Complete by: As directed    Call MD for:  extreme fatigue   Complete by: As directed    Call MD for:  hives   Complete by: As directed    Call MD for:  persistant dizziness or light-headedness   Complete by: As directed    Call MD for:  persistant nausea and vomiting   Complete by: As directed    Call MD for:  redness, tenderness, or signs of infection (pain, swelling, redness, odor or green/yellow discharge around incision site)   Complete by: As directed    Call MD for:  severe uncontrolled pain   Complete by: As directed    Call MD for:  temperature >100.4   Complete by: As directed    Change dressing on IV access line weekly and PRN   Complete by: As directed    Diet - low sodium heart healthy   Complete by: As directed    Diet Carb Modified   Complete by: As directed    Discharge instructions   Complete  by: As directed    You were cared for by a hospitalist during your hospital stay. If you have any questions about your discharge medications or the care you received while you were in the hospital after you are discharged, you can call the unit and ask to speak with the hospitalist on call if the hospitalist that took care of you is not available. Once you are discharged, your primary care physician will handle any further medical issues. Please note that NO REFILLS for any discharge medications will be authorized once you are discharged, as it is imperative that you return to your primary care physician (or establish a relationship  with a primary care physician if you do not have one) for your aftercare needs so that they can reassess your need for medications and monitor your lab values.  Follow up with PCP, Cardiology, Pulmonary, and ID. Take all medications as prescribed. If symptoms change or worsen please return to the ED for evaluation   Discharge wound care:   Complete by: As directed    Wound care  Daily         Comments: Wound care to Stage 3 intragluteal pressure injury and Stage 2 area of partial skin loss at left buttock (both POA):  cleanse with NS, pat dry. Cover with folded piece of xeroform gauze, Lawson # 294, top with dry gauze and cover area with a silicone foam dressing to the sacrum. Change xeroform daily, may reuse silicone foam dressing for 2-3 days. Change PRN for rolling of dressing edges or soiling. Turn side to side       Wound care  Every shift         Comments: Wound care to right lateral foot at 5th metatarsal head: Cleanse with soap and water, rinse and pat dry. Cover with xeroform gauze Kellie Simmering [phone]), top with dry gauze 2x2. Secure with a few turns of Kerlix roll gauze/paper tape. Change twice daily. Place foot (feet) into Prevalon boots.   Flush IV access with Sodium Chloride 0.9% and Heparin 10 units/ml or 100 units/ml   Complete by: As directed    Home infusion instructions - [facility]   Complete by: As directed    Instructions: Flush IV access with Sodium Chloride 0.9% and Heparin 10units/ml or 100units/ml   Change dressing on IV access line: Weekly and PRN   Instructions Cath Flo 11m: Administer for PICC Line occlusion and as ordered by physician for other access device   [facility] pharmacist to adjust dose for: Vancomycin, Aminoglycosides and other anti-infective therapies as requested by physician   Increase activity slowly   Complete by: As directed    Method of administration may be changed at the discretion of home infusion pharmacist based upon  assessment of the patient and/or caregiver's ability to self-administer the medication ordered   Complete by: As directed      Allergies as of 12/16/2020      Reactions   Codeine Hives, Itching   Ofev [nintedanib] Diarrhea   SEVERE DIARRHEA   Other Diarrhea, Other (See Comments)   Esbriet causes elevated LFTs. D/C on 06/14/17 and SEVERE DIARRHEA      Medication List    TAKE these medications   albuterol 108 (90 Base) MCG/ACT inhaler Commonly known as: VENTOLIN HFA Inhale 2 puffs into the lungs every 4 (four) hours as needed for wheezing or shortness of breath.   apixaban 5 MG Tabs tablet Commonly known as: ELIQUIS Take 1 tablet (5  mg total) by mouth 2 (two) times daily.   aspirin EC 81 MG tablet Take 81 mg by mouth daily.   atorvastatin 40 MG tablet Commonly known as: LIPITOR Take 40 mg by mouth at bedtime.   ceFAZolin  IVPB Commonly known as: ANCEF Inject 2 g into the vein every 8 (eight) hours. Indication:  Endocarditis First Dose: Yes Last Day of Therapy:  01/25/21 Labs - Once weekly:  CBC/D and BMP, Labs - Every other week:  ESR and CRP Method of administration: IV Push Method of administration may be changed at the discretion of home infusion pharmacist based upon assessment of the patient and/or caregiver's ability to self-administer the medication ordered.   clotrimazole-betamethasone cream Commonly known as: LOTRISONE Apply 1 application topically daily as needed (for skin infection).   [employer] Acetaminophen 325 MG tablet Generic drug: acetaminophen Take 650 mg by mouth daily as needed for pain.   Ensure Max Protein Liqd Take 330 mLs (11 oz total) by mouth 2 (two) times daily.   EPINEPHrine 0.3 mg/0.3 mL Soaj injection Commonly known as: EPI-PEN Inject 0.3 mg into the muscle once as needed for anaphylaxis (max 3 doses).   FreeStyle Libre 14 Day Sensor Misc 1 patch every 14 (fourteen) days.   furosemide 40 MG tablet Commonly known as: LASIX Take 40 mg by  mouth daily.   insulin lispro 100 UNIT/ML injection Commonly known as: HUMALOG Inject 0.07 mLs (7 Units total) into the skin 3 (three) times daily with meals. What changed: how much to take   liver oil-zinc oxide 40 % ointment Commonly known as: DESITIN Apply 1 application topically in the morning and at bedtime.   LORazepam 1 MG tablet Commonly known as: ATIVAN Take 1 mg by mouth at bedtime as needed for sleep.   losartan 50 MG tablet Commonly known as: COZAAR Take 50 mg by mouth daily.   magnesium oxide 400 MG tablet Commonly known as: MAG-OX TAKE 1 CAPSULE BY MOUTH ONCE DAILY What changed: how much to take   metoprolol tartrate 50 MG tablet Commonly known as: LOPRESSOR Take 25 mg by mouth 2 (two) times daily.   multivitamin with minerals Tabs tablet Take 1 tablet by mouth daily.   pantoprazole 40 MG tablet Commonly known as: PROTONIX Take 40 mg by mouth daily.   polyethylene glycol powder 17 GM/SCOOP powder Commonly known as: GLYCOLAX/MIRALAX Take 17 g by mouth daily.   potassium chloride SA 20 MEQ tablet Commonly known as: KLOR-CON Take 20-40 mEq by mouth See admin instructions. Takes 2 tablets (40 mEq totally) by mouth for 7 days; then takes 1 tablet (20 mEq totally) by mouth daily after that   [place] FlexTouch 200 UNIT/ML FlexTouch Pen Generic drug: insulin degludec Inject 16 Units into the skin at bedtime. What changed: how much to take   vitamin C with rose hips 500 MG tablet Take 500 mg by mouth daily.            Discharge Care Instructions  (From admission, onward)         Start     Ordered   12/16/20 0000  Change dressing on IV access line weekly and PRN  (Home infusion instructions - [facility] )        12/16/20 1434   12/16/20 0000  Discharge wound care:       Comments: Wound care  Daily         Comments: Wound care to Stage 3 intragluteal pressure injury and Stage  2 area of partial skin loss at left buttock (both POA):   cleanse with NS, pat dry. Cover with folded piece of xeroform gauze, Lawson # 294, top with dry gauze and cover area with a silicone foam dressing to the sacrum. Change xeroform daily, may reuse silicone foam dressing for 2-3 days. Change PRN for rolling of dressing edges or soiling. Turn side to side       Wound care  Every shift         Comments: Wound care to right lateral foot at 5th metatarsal head: Cleanse with soap and water, rinse and pat dry. Cover with xeroform gauze Kellie Simmering [phone]), top with dry gauze 2x2. Secure with a few turns of Kerlix roll gauze/paper tape. Change twice daily. Place foot (feet) into Prevalon boots.   12/16/20 1434          Follow-up Information    Shirley Friar, PA-C Follow up.   Specialty: Physician Assistant Why: on 12/25/20 at 1145 for post extraction check Contact information: [place] [address] [place], [facility] Follow up.   Specialty: [place] Why: the office will call to schedule home health therapy visits Contact information: [address] [place] [address] [phone]              Allergies  Allergen Reactions  . Codeine Hives and Itching  . Ofev [Nintedanib] Diarrhea    SEVERE DIARRHEA  . Other Diarrhea and Other (See Comments)    Esbriet causes elevated LFTs. D/C on 06/14/17 and SEVERE DIARRHEA   Consultations:  Cardiology  And EP  Infectious Diseases  Pulmonary  Procedures/Studies: DG Chest 2 View  Result Date: 12/15/2020 CLINICAL DATA:  Pacemaker infection. EXAM: CHEST - 2 VIEW COMPARISON:  Chest CT and chest x-ray 12/08/2020. Chest x-ray 04/27/2018. FINDINGS: Interval removal of cardiac pacer. Prior CABG. Stable cardiomegaly. Stable chronic interstitial changes. Stable left-sided pleural thickening. No pneumothorax. Prior cervical spine fusion. IMPRESSION: 1. Interval removal of cardiac pacer. No pneumothorax. 2. Stable  cardiomegaly. Prior CABG. 3. Stable chronic interstitial changes. Electronically Signed   By: Marcello Moores  Register   On: 12/15/2020 06:19   CT CHEST WO CONTRAST  Result Date: 12/08/2020 CLINICAL DATA:  80 year old male with idiopathic pulmonary fibrosis. Concern for pneumonia or abscess. EXAM: CT CHEST WITHOUT CONTRAST TECHNIQUE: Multidetector CT imaging of the chest was performed following the standard protocol without IV contrast. COMPARISON:  Chest CT dated 09/30/2019. FINDINGS: Evaluation of this exam is limited in the absence of intravenous contrast. Cardiovascular: There is mild cardiomegaly. No significant pericardial effusion. Three-vessel coronary vascular calcification and postsurgical changes of CABG. Left pectoral pacemaker device. There is mild atherosclerotic calcification of the thoracic aorta. There is dilatation of the main pulmonary trunk suggestive of a degree of pulmonary hypertension. Clinical correlation is recommended Mediastinum/Nodes: No hilar or mediastinal adenopathy. The esophagus is grossly unremarkable. No mediastinal fluid collection. Lungs/Pleura: Diffuse bilateral interstitial coarsening as seen previously in keeping with history of pulmonary fibrosis. No new consolidation. There is no pleural effusion pneumothorax. The central airways are patent. Upper Abdomen: Slight irregularity of the liver contour may represent early changes of cirrhosis. Musculoskeletal: Median sternotomy wires. Degenerative changes of the spine. No acute osseous pathology. Partially visualized cervical fusion. IMPRESSION: 1. No acute intrathoracic pathology. 2. Pulmonary fibrosis. 3. Mild cardiomegaly with three-vessel coronary vascular calcification and postsurgical changes of CABG. 4. Aortic Atherosclerosis (ICD10-I70.0). Electronically Signed  By: Anner Crete M.D.   On: 12/08/2020 22:39   CT FOOT RIGHT WO CONTRAST  Result Date: 12/09/2020 CLINICAL DATA:  Right foot pain and swelling in a diabetic  patient. Skin ulceration at the distal fifth metatarsal. Question osteomyelitis. EXAM: CT OF THE RIGHT FOOT WITHOUT CONTRAST TECHNIQUE: Multidetector CT imaging of the right foot was performed according to the standard protocol. Multiplanar CT image reconstructions were also generated. COMPARISON:  None. FINDINGS: Bones/Joint/Cartilage No bony destructive change or periosteal reaction to suggest osteomyelitis is identified. No joint effusion is seen. Joint spaces are preserved. Small calcaneal spurs are noted. Ligaments Suboptimally assessed by CT. Muscles and Tendons No intramuscular fluid collection is identified. There is fatty atrophy of intrinsic musculature of the foot. No tendon tear. The distal Achilles tendon is mildly thickened compatible tendinopathy. Soft tissues There is subcutaneous edema over the dorsum of the foot. No radiopaque foreign body is identified. Skin ulceration adjacent to the fifth metatarsal head is noted. There is subcutaneous edema over the dorsum of the foot. IMPRESSION: Negative for CT evidence of osteomyelitis, abscess or septic joint. Skin ulceration adjacent to the fifth metatarsal. No abscess is identified. Subcutaneous edema about the dorsum of the foot is compatible with cellulitis or dependent change. Mildly thickened Achilles tendinopathy compatible with insertional tendinosis without tear. Electronically Signed   By: Inge Rise M.D.   On: 12/09/2020 14:53   EP PPM/ICD IMPLANT  Result Date: 12/14/2020 Conclusion: Successful extraction of a [employer] dual-chamber pacing system in a patient with staff aureus bacteremia and evidence of pacemaker system endocarditis. Cristopher Peru, MD  PERIPHERAL VASCULAR CATHETERIZATION  Result Date: 12/10/2020 DATE OF SERVICE: 12/10/2020 PATIENT:  Candise Bowens Klar  80 y.o. male PRE-OPERATIVE DIAGNOSIS:  pvd POST-OPERATIVE DIAGNOSIS:  * No post-op diagnosis entered * PROCEDURE:  1) US guided LCFA access 2) Aortogram 3) RLE angiogram  (31m iodinated contrast) 4) R superficial femoral / above-knee popliteal stenting (6x1275m 6x10080m6x80m30m2 Eluvia - total treatment length 300mm16mRGEON:  Surgeon(s) and Role:    * HawkeCherre Robins- Primary ASSISTANT: none ANESTHESIA:   local EBL: min BLOOD ADMINISTERED:none DRAINS: none LOCAL MEDICATIONS USED:  LIDOCAINE SPECIMEN:  none COUNTS: confirmed correct. TOURNIQUET:  * No tourniquets in log * PATIENT DISPOSITION:  PACU - hemodynamically stable.  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 After careful discussion of risks, benefits, and alternatives the patient was offered angiography. We specifically discussed access site complications and contrast nephropathy. The patient understood and wished to proceed. OPERATIVE FINDINGS: Aorta: no flow limiting disease Right iliac arteries: no flow limiting disease Left iliac arteries: no flow limiting disease Right femoral arteries: SFA occluded just distal to origin. Reconstitution in distal SFA. Right popliteal arteries: Above knee popliteal artery with focal >70% stenosis. Right tibial trifurcation: Peroneal dominant flow to the foot. PT occludes in the calf. Right tibial vessels: Peroneal fills the foot briskly. Good pedal flow. DESCRIPTION OF PROCEDURE: After identification of the patient in the pre-operative holding area, the patient was transferred to the operating room. The patient was positioned supine on the operating room table. Anesthesia was induced. The groins was prepped and draped in standard fashion. A surgical pause was performed confirming correct patient, procedure, and operative location. The left groin  was anesthetized with subcutaneous injection of 1% lidocaine. Using ultrasound  guidance, the left common femoral artery was accessed with micropuncture technique. Fluoroscopy was used to confirm cannulation over the femoral head. Sheathogram was not performed. The 90F sheath was upsized to 575F. An 035 glidewire advantage was advanced into the distal aorta. Over the wire an omni flush catheter was advanced to the level of L2. Aortogram was performed - see above for details. The right common iliac artery was selected with the 035 glidewire advantage. The wire was advanced into the superficial femoral artery. Over the wire the omni flush catheter was advanced into the external iliac artery. Selective angiography was performed - see above for details. The decision was made to intervene. The patient was heparinized with 10000 units of heparin. The sheath was exchanged for a 75F x 45cm sheath. Selective angiography of the left lower extremity was performed prior to intervention. The SFA and popliteal lesions were treated with stenting (6x16m, 6x1028m 6x8064m 2 Eluvia). The stents were post dilated with a 5x200m14mstang balloon. Completion angiography revealed resolution of the stenosis / occlusion. A perclose device was used to close the arteriotomy. Hemostasis was excellent upon completion. Upon completion of the case instrument and sharps counts were confirmed correct. The patient was transferred to the PACU in good condition. I was present for all portions of the procedure. ThomYevonne AlinewkStanford Breed [facility] Number: ([id number]/2022 6:36 PM   DG CHEST PORT 1 VIEW  Result Date: 12/16/2020 CLINICAL DATA:  PICC line placement. EXAM: PORTABLE CHEST 1 VIEW COMPARISON:  December 15, 2020. FINDINGS: Right PICC tip projects at the superior cavoatrial junction. No visible pneumothorax on this semi erect radiograph. Similar enlargement the cardiac silhouette. Prior CABG with median sternotomy. Similar left pleural thickening and chronic interstitial  changes. Prior ACDF. IMPRESSION: 1. Right PICC tip projects at the superior cavoatrial junction. No visible pneumothorax. 2. Similar cardiomegaly, chronic interstitial change, and left pleural thickening. Electronically Signed   By: FredMargaretha Sheffield  On: 12/16/2020 11:33   DG Chest Port 1 View  Result Date: 12/08/2020 CLINICAL DATA:  Shortness of breath EXAM: PORTABLE CHEST 1 VIEW COMPARISON:  07/17/2020, CT 09/30/2019, 03/31/2020, 04/27/2018 FINDINGS: Surgical hardware in the cervical spine. Left-sided pacing device as before. Interval post sternotomy changes. Diffuse bilateral left greater than right reticular and ground-glass opacity, likely corresponding to history of pulmonary fibrosis. Chronic pleural scarring on the left. No acute airspace consolidation or pneumothorax. Stable cardiomediastinal silhouette. IMPRESSION: 1. Stable radiographic appearance of the chest with pulmonary fibrosis. No acute airspace disease. Electronically Signed   By: Kim Donavan Foil.   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 High Risk         Hypertension, hyperlipidemia, Diabetes, coronary artery Factors:          disease. Other Factors: AFIB, CKD3, PM.  Comparison Study: No previous exams. Performing Technologist: HillRogelia Rohrer, RDMS  Examination Guidelines: A complete evaluation includes at minimum, Doppler waveform signals and systolic blood pressure reading at the level of bilateral brachial, anterior tibial, and posterior tibial arteries, when vessel segments are accessible. Bilateral testing is considered an integral part of a complete examination. Photoelectric Plethysmograph (PPG) waveforms and toe systolic pressure readings are included as required and additional duplex testing as needed. Limited examinations for reoccurring indications may be  performed as noted.  ABI Findings:  +---------+------------------+-----+-------------------+----------------+ Right    Rt Pressure (mmHg)IndexWaveform           Comment          +---------+------------------+-----+-------------------+----------------+ Brachial 122                    biphasic                            +---------+------------------+-----+-------------------+----------------+ PTA                             dampened monophasicnon compressible +---------+------------------+-----+-------------------+----------------+ DP       80                0.62 dampened monophasic                 +---------+------------------+-----+-------------------+----------------+ Great Toe0                 0.00 Absent                              +---------+------------------+-----+-------------------+----------------+ +---------+------------------+-----+----------+----------------+ Left     Lt Pressure (mmHg)IndexWaveform  Comment          +---------+------------------+-----+----------+----------------+ Brachial 130                    triphasic                  +---------+------------------+-----+----------+----------------+ PTA                             biphasic  non compressible +---------+------------------+-----+----------+----------------+ DP       81                0.62 monophasic                 +---------+------------------+-----+----------+----------------+ Great Toe47                0.36 Abnormal                   +---------+------------------+-----+----------+----------------+ +-------+-----------+-----------+------------+------------+ ABI/TBIToday's ABIToday's TBIPrevious ABIPrevious TBI +-------+-----------+-----------+------------+------------+ Right  0.62       0                                   +-------+-----------+-----------+------------+------------+ Left   0.62       0.36                                +-------+-----------+-----------+------------+------------+   Summary: Right: Resting right ankle-brachial index indicates moderate right lower extremity arterial disease. The right toe-brachial index is abnormal. ABI's likely falsely elevated due to vessel calcification. Left: Resting left ankle-brachial index indicates moderate left lower extremity arterial disease. The left toe-brachial index is abnormal.  *See table(s) above for measurements and observations.  Electronically signed by Jamelle Haring on 12/09/2020 at 12:28:38 PM.    Final    ECHO TEE  Result Date: 12/11/2020    TRANSESOPHOGEAL ECHO REPORT   Patient Name:   JACQUEZ SHEETZ Date of Exam: 12/11/2020 Medical Rec #:  [id number]        Height:       72.0  in Accession #:    [id number]       Weight:       262.8 lb Date of Birth:  July 04, 1941        BSA:          2.393 m Patient Age:    57 years         BP:           114/54 mmHg Patient Gender: M                HR:           83 bpm. Exam Location:  Inpatient Procedure: 2D Echo, 3D Echo, Transesophageal Echo and Color Doppler Indications:     Bacteremia  History:         Patient has prior history of Echocardiogram examinations, most                  recent 03/29/2020. Risk Factors:Sleep Apnea.  Sonographer:     Clayton Lefort RDCS (AE) Referring Phys:  [id number] [place] Diagnosing Phys: Eleonore Chiquito MD PROCEDURE: After discussion of the risks and benefits of a TEE, an informed consent was obtained from the patient. TEE procedure time was 33 minutes. The transesophogeal probe was passed without difficulty through the esophogus of the patient. Imaged were obtained with the patient in a left lateral decubitus position. Local oropharyngeal anesthetic was provided with Cetacaine. Sedation performed by different physician. The patient was monitored while under deep sedation. Anesthestetic sedation was provided intravenously by Anesthesiology: 573m of Propofol, 643mof Lidocaine. Image quality was excellent. The patient's vital signs; including heart rate, blood pressure,  and oxygen saturation; remained stable throughout the procedure. The patient developed no complications during the procedure. IMPRESSIONS  1. There is small fibrous stranding attached to the leaflet tips of the aortic valve that could be related to valve degeneration. 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 In the setting of bacteremia, cannot exclude endocarditis. The stranding is small (0.5 cm x 0.2 cm) and there is no significant regurgitaiton of the aortic valve.  2. There is no vegetation seen on the pacemaker leads.  3. The aortic valve is trileaflet with moderate calcification and moderately reduced motion. 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 There is mild aortic stenosis. Vmax 2.2 m/s, MG 12.5 mmHG, AVA 1.65 cm2. The aortic valve is tricuspid. There is moderate calcification of the aortic valve. There is moderate thickening of the aortic valve. Aortic valve regurgitation is not visualized. Mild aortic valve stenosis.  4. Left ventricular ejection fraction, by estimation, is 50 to 55%. The left ventricle has low normal function.  5. Right ventricular systolic function is moderately reduced. The right ventricular size is moderately enlarged.  6. Left atrial size was mildly dilated. No left atrial/left atrial appendage thrombus was detected. The LAA emptying velocity was 49 cm/s.  7. The mitral valve is grossly normal. Mild mitral valve regurgitation. No evidence of mitral stenosis.  8. Moderate to severe tricuspid regurgitation is present. 2D PISA 0.9 cm. 2D VC 0.5 cm, 2D ERO 0.44 cm2, R vol 36 cc. The tricuspid valve is abnormal. Tricuspid valve regurgitation is moderate to severe. FINDINGS  Left Ventricle: Left ventricular ejection fraction, by estimation, is 50 to 55%. The left ventricle has low normal function. The left ventricular internal cavity size was normal in size. Right Ventricle: The right ventricular size is moderately enlarged. No increase in right ventricular wall thickness. Right  ventricular systolic function is  moderately reduced. Left Atrium: Left atrial size was mildly dilated. No left atrial/left atrial appendage thrombus was detected. The LAA emptying velocity was 49 cm/s. Right Atrium: Right atrial size was normal in size. Pericardium: There is no evidence of pericardial effusion. Mitral Valve: The mitral valve is grossly normal. Mild mitral valve regurgitation. No evidence of mitral valve stenosis. There is no evidence of mitral valve vegetation. Tricuspid Valve: Moderate to severe tricuspid regurgitation is present. 2D PISA 0.9 cm. 2D VC 0.5 cm, 2D ERO 0.44 cm2, R vol 36 cc. The tricuspid valve is abnormal. Tricuspid valve regurgitation is moderate to severe. No evidence of tricuspid stenosis. There is no evidence of tricuspid valve vegetation. Aortic Valve: The aortic valve is trileaflet with moderate calcification and moderately reduced motion. 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 There is mild aortic stenosis. Vmax 2.2 m/s, MG 12.5 mmHG, AVA 1.65 cm2. The aortic valve is tricuspid. There is moderate calcification of the aortic valve. There is moderate thickening of the aortic valve. Aortic valve regurgitation is not visualized. Mild aortic stenosis is present. Aortic valve mean gradient measures 12.5 mmHg. Aortic valve peak gradient measures 19.9 mmHg. Aortic valve area, by VTI measures 1.65 cm. Pulmonic Valve: The pulmonic valve was grossly normal. Pulmonic valve regurgitation is trivial. No evidence of pulmonic stenosis. There is no evidence of pulmonic valve vegetation. Aorta: The aortic root is normal in size and structure. Venous: The right upper pulmonary vein, right lower pulmonary vein, left lower pulmonary vein and left upper pulmonary vein are normal. IAS/Shunts: The interatrial septum appears to be lipomatous. No atrial level shunt detected by color flow Doppler. Additional Comments: A pacer wire is visualized in the right atrium and right ventricle.  LEFT VENTRICLE  PLAX 2D LVOT diam:     2.45 cm LV SV:         69 LV SV Index:   29 LVOT Area:     4.71 cm  AORTIC VALVE AV Area (Vmax):    1.89 cm AV Area (Vmean):   1.70 cm AV Area (VTI):     1.65 cm AV Vmax:           223.00 cm/s AV Vmean:          166.000 cm/s AV VTI:            0.420 m AV Peak Grad:      19.9 mmHg AV Mean Grad:      12.5 mmHg LVOT Vmax:         89.40 cm/s LVOT Vmean:        59.900 cm/s LVOT VTI:          0.147 m LVOT/AV VTI ratio: 0.35  AORTA Ao Root diam: 3.60 cm Ao Asc diam:  3.90 cm TRICUSPID VALVE TR Peak grad:   47.9 mmHg TR Mean grad:   22.0 mmHg TR Vmax:        346.00 cm/s TR Vmean:       208.0 cm/s  SHUNTS Systemic VTI:  0.15 m Systemic Diam: 2.45 cm Eleonore Chiquito MD Electronically signed by Eleonore Chiquito MD Signature Date/Time: 12/11/2020/2:01:33 PM    Final    CUP PACEART INCLINIC DEVICE CHECK  Result Date: 12/08/2020 Pacemaker check in clinic. Normal device function. Thresholds, sensing, impedances consistent with previous measurements. Device programmed to maximize longevity. AT/AF burden 24%, appear AF w/ controlled VR. No OAC on file. Patient will start Eliquis and come back 01/06/21 to attempt in office ATP. No high ventricular  rates noted. Device programmed at appropriate safety margins. Histogram distribution appropriate for patient activity level. Device programmed to optimize intrinsic conduction. Estimated longevity 8 years 11 months. Patient enrolled in remote follow-up. Patient education completed. ROV w/ Dr. Lovena Le 01/06/21.Lavenia Atlas, BSN, RN  [place] EKG SITE RITE  Result Date: 12/14/2020 If [facility] image not attached, placement could not be confirmed due to current cardiac rhythm.   Subjective: Seen and examined at beside and he was doing well. No CP or SOB. PICC line palced. He felt well. Stable to D/C home and follow up with Cardiology and Pulmonary  Discharge Exam: Vitals:   12/16/20 1500 12/16/20 1510  BP:  (!) 101/36  Pulse:  (!) 59  Resp:  18  Temp: 98 F (36.7  C) 98 F (36.7 C)  SpO2:  100%   Vitals:   12/16/20 0602 12/16/20 0805 12/16/20 1500 12/16/20 1510  BP: (!) 146/60   (!) 101/36  Pulse: 65   (!) 59  Resp: (!) 22   18  Temp: 98.3 F (36.8 C)  98 F (36.7 C) 98 F (36.7 C)  TempSrc: Oral  Oral Oral  SpO2: 100% 100%  100%  Weight: 119 kg     Height:       General: Pt is alert, awake, not in acute distress Cardiovascular: RRR, S1/S2 +, no rubs, no gallops Respiratory: Diminished bilaterally, no wheezing, no rhonchi Abdominal: Soft, NT, Distended 2/2 body habitus, bowel sounds + Extremities: Trace edema, no cyanosis  The results of significant diagnostics from this hospitalization (including imaging, microbiology, ancillary and laboratory) are listed below for reference.    Microbiology: Recent Results (from the past 240 hour(s))  Blood culture (routine x 2)     Status: Abnormal   Collection Time: 12/08/20  6:05 PM   Specimen: BLOOD  Result Value Ref Range Status   Specimen Description BLOOD RIGHT ANTECUBITAL  Final   Special Requests   Final    BOTTLES DRAWN AEROBIC AND ANAEROBIC Blood Culture adequate volume   Culture  Setup Time   Final    GRAM POSITIVE COCCI IN CLUSTERS IN BOTH AEROBIC AND ANAEROBIC BOTTLES Organism ID to follow CRITICAL RESULT CALLED TO, READ BACK BY AND VERIFIED WITH: A WOLFE PHARMD 1407 12/09/20 A BROWNING Performed at [facility], [place] [address] [place], [place] [address]    Culture STAPHYLOCOCCUS AUREUS (A)  Final   Report Status 12/12/2020 FINAL  Final   Organism ID, Bacteria STAPHYLOCOCCUS AUREUS  Final      Susceptibility   Staphylococcus aureus - MIC*    CIPROFLOXACIN <=0.5 SENSITIVE Sensitive     ERYTHROMYCIN <=0.25 SENSITIVE Sensitive     GENTAMICIN <=0.5 SENSITIVE Sensitive     OXACILLIN 0.5 SENSITIVE Sensitive     TETRACYCLINE <=1 SENSITIVE Sensitive     VANCOMYCIN 1 SENSITIVE Sensitive     TRIMETH/SULFA <=10 SENSITIVE Sensitive     CLINDAMYCIN <=0.25 SENSITIVE Sensitive      RIFAMPIN <=0.5 SENSITIVE Sensitive     Inducible Clindamycin NEGATIVE Sensitive     * STAPHYLOCOCCUS AUREUS  Blood culture (routine x 2)     Status: Abnormal   Collection Time: 12/08/20  6:05 PM   Specimen: BLOOD  Result Value Ref Range Status   Specimen Description BLOOD LEFT ANTECUBITAL  Final   Special Requests   Final    BOTTLES DRAWN AEROBIC AND ANAEROBIC Blood Culture adequate volume   Culture  Setup Time   Final    GRAM POSITIVE  COCCI IN CLUSTERS IN BOTH AEROBIC AND ANAEROBIC BOTTLES CRITICAL VALUE NOTED.  VALUE IS CONSISTENT WITH PREVIOUSLY REPORTED AND CALLED VALUE.    Culture (A)  Final    STAPHYLOCOCCUS AUREUS SUSCEPTIBILITIES PERFORMED ON PREVIOUS CULTURE WITHIN THE LAST 5 DAYS. Performed at [facility], [place] [address] [place], [place] [address]    Report Status 12/12/2020 FINAL  Final  Resp Panel by RT-PCR (Flu A&B, Covid) Nasopharyngeal Swab     Status: None   Collection Time: 12/08/20  6:05 PM   Specimen: Nasopharyngeal Swab; Nasopharyngeal(NP) swabs in vial transport medium  Result Value Ref Range Status   SARS Coronavirus 2 by RT PCR NEGATIVE NEGATIVE Final    Comment: (NOTE) SARS-CoV-2 target nucleic acids are NOT DETECTED.  The SARS-CoV-2 RNA is generally detectable in upper respiratory specimens during the acute phase of infection. The lowest concentration of SARS-CoV-2 viral copies this assay can detect is 138 copies/mL. A negative result does not preclude SARS-Cov-2 infection and should not be used as the sole basis for treatment or other patient management decisions. A negative result may occur with  improper specimen collection/handling, submission of specimen other than nasopharyngeal swab, presence of viral mutation(s) within the areas targeted by this assay, and inadequate number of viral copies(<138 copies/mL). A negative result must be combined with clinical observations, patient history, and epidemiological information. The expected result  is Negative.  Fact Sheet for Patients:  EntrepreneurPulse.com.au  Fact Sheet for Healthcare Providers:  IncredibleEmployment.be  This test is no t yet approved or cleared by the [place] FDA and  has been authorized for detection and/or diagnosis of SARS-CoV-2 by FDA under an Emergency Use Authorization (EUA). This EUA will remain  in effect (meaning this test can be used) for the duration of the COVID-19 declaration under Section 564(b)(1) of the Act, 21 U.S.C.section 360bbb-3(b)(1), unless the authorization is terminated  or revoked sooner.       Influenza A by PCR NEGATIVE NEGATIVE Final   Influenza B by PCR NEGATIVE NEGATIVE Final    Comment: (NOTE) The Xpert Xpress SARS-CoV-2/FLU/RSV plus assay is intended as an aid in the diagnosis of influenza from Nasopharyngeal swab specimens and should not be used as a sole basis for treatment. Nasal washings and aspirates are unacceptable for Xpert Xpress SARS-CoV-2/FLU/RSV testing.  Fact Sheet for Patients: EntrepreneurPulse.com.au  Fact Sheet for Healthcare Providers: IncredibleEmployment.be  This test is not yet approved or cleared by the [place] FDA and has been authorized for detection and/or diagnosis of SARS-CoV-2 by FDA under an Emergency Use Authorization (EUA). This EUA will remain in effect (meaning this test can be used) for the duration of the COVID-19 declaration under Section 564(b)(1) of the Act, 21 U.S.C. section 360bbb-3(b)(1), unless the authorization is terminated or revoked.  Performed at [facility], [place] [address], [place],  [address]   Blood Culture ID Panel (Reflexed)     Status: Abnormal   Collection Time: 12/08/20  6:05 PM  Result Value Ref Range Status   Enterococcus faecalis NOT DETECTED NOT DETECTED Final   Enterococcus Faecium NOT DETECTED NOT DETECTED Final   Listeria monocytogenes NOT DETECTED NOT  DETECTED Final   Staphylococcus species DETECTED (A) NOT DETECTED Final    Comment: CRITICAL RESULT CALLED TO, READ BACK BY AND VERIFIED WITH: A WOLFE PHARMD 1407 12/09/20 A BROWNING    Staphylococcus aureus (BCID) DETECTED (A) NOT DETECTED Final    Comment: CRITICAL RESULT CALLED TO, READ BACK BY AND VERIFIED  WITH: A WOLFE [id number] 12/09/20 A BROWNING    Staphylococcus epidermidis NOT DETECTED NOT DETECTED Final   Staphylococcus lugdunensis NOT DETECTED NOT DETECTED Final   Streptococcus species NOT DETECTED NOT DETECTED Final   Streptococcus agalactiae NOT DETECTED NOT DETECTED Final   Streptococcus pneumoniae NOT DETECTED NOT DETECTED Final   Streptococcus pyogenes NOT DETECTED NOT DETECTED Final   A.calcoaceticus-baumannii NOT DETECTED NOT DETECTED Final   Bacteroides fragilis NOT DETECTED NOT DETECTED Final   Enterobacterales NOT DETECTED NOT DETECTED Final   Enterobacter cloacae complex NOT DETECTED NOT DETECTED Final   Escherichia coli NOT DETECTED NOT DETECTED Final   Klebsiella aerogenes NOT DETECTED NOT DETECTED Final   Klebsiella oxytoca NOT DETECTED NOT DETECTED Final   Klebsiella pneumoniae NOT DETECTED NOT DETECTED Final   Proteus species NOT DETECTED NOT DETECTED Final   Salmonella species NOT DETECTED NOT DETECTED Final   Serratia marcescens NOT DETECTED NOT DETECTED Final   Haemophilus influenzae NOT DETECTED NOT DETECTED Final   Neisseria meningitidis NOT DETECTED NOT DETECTED Final   Pseudomonas aeruginosa NOT DETECTED NOT DETECTED Final   Stenotrophomonas maltophilia NOT DETECTED NOT DETECTED Final   Candida albicans NOT DETECTED NOT DETECTED Final   Candida auris NOT DETECTED NOT DETECTED Final   Candida glabrata NOT DETECTED NOT DETECTED Final   Candida krusei NOT DETECTED NOT DETECTED Final   Candida parapsilosis NOT DETECTED NOT DETECTED Final   Candida tropicalis NOT DETECTED NOT DETECTED Final   Cryptococcus neoformans/gattii NOT DETECTED NOT DETECTED  Final   Meth resistant mecA/C and MREJ NOT DETECTED NOT DETECTED Final    Comment: Performed at [facility], [address] [place], [place] [address]  Culture, blood (routine x 2)     Status: None   Collection Time: 12/11/20  4:36 AM   Specimen: BLOOD LEFT HAND  Result Value Ref Range Status   Specimen Description BLOOD LEFT HAND  Final   Special Requests   Final    BOTTLES DRAWN AEROBIC AND ANAEROBIC Blood Culture adequate volume   Culture   Final    NO GROWTH 5 DAYS Performed at [facility], [place] [address], [place], [place] [address]    Report Status 12/16/2020 FINAL  Final  Culture, blood (routine x 2)     Status: None   Collection Time: 12/11/20  4:37 AM   Specimen: BLOOD RIGHT HAND  Result Value Ref Range Status   Specimen Description BLOOD RIGHT HAND  Final   Special Requests   Final    BOTTLES DRAWN AEROBIC AND ANAEROBIC Blood Culture adequate volume   Culture   Final    NO GROWTH 5 DAYS Performed at [facility], [place] [address], [place], [place] [address]    Report Status 12/16/2020 FINAL  Final  MRSA PCR Screening     Status: None   Collection Time: 12/13/20  3:06 PM   Specimen: Nasal Mucosa; Nasopharyngeal  Result Value Ref Range Status   MRSA by PCR NEGATIVE NEGATIVE Final    Comment:        The GeneXpert MRSA Assay (FDA approved for NASAL specimens only), is one component of a comprehensive MRSA colonization surveillance program. It is not intended to diagnose MRSA infection nor to guide or monitor treatment for MRSA infections. Performed at [facility], [place] [address], [place], [place] [address]   Culture, blood (routine x 2)     Status: None (Preliminary result)   Collection Time: 12/15/20  9:14 AM   Specimen: BLOOD  Result Value Ref Range Status   Specimen Description BLOOD LEFT ANTECUBITAL  Final   Special Requests   Final    BOTTLES DRAWN AEROBIC AND ANAEROBIC Blood Culture adequate volume   Culture   Final    NO GROWTH  < 24 HOURS Performed at [facility], [address], [place], [place] [address]    Report Status PENDING  Incomplete  Culture, blood (routine x 2)     Status: None (Preliminary result)   Collection Time: 12/15/20  9:20 AM   Specimen: BLOOD LEFT HAND  Result Value Ref Range Status   Specimen Description BLOOD LEFT HAND  Final   Special Requests   Final    BOTTLES DRAWN AEROBIC AND ANAEROBIC Blood Culture results may not be optimal due to an inadequate volume of blood received in culture bottles   Culture   Final    NO GROWTH < 24 HOURS Performed at [facility], [place] [address], [place], [place] [address]    Report Status PENDING  Incomplete     Labs: BNP (last 3 results) Recent Labs    07/17/20 1438 12/08/20 1805  BNP 210.9* 865.7*   Basic Metabolic Panel: Recent Labs  Lab 12/10/20 0354 12/11/20 0436 12/12/20 0203 12/15/20 0257 12/16/20 0854  NA 131* 129* 134* 132* 134*  K 4.2 3.9 4.2 4.6 4.3  CL 96* 99 102 100 99  CO2 25 21* 23 23 26   GLUCOSE 70 114* 162* 147* 169*  BUN 31* 28* 31* 28* 31*  CREATININE 1.78* 1.55* 1.52* 1.41* 1.37*  CALCIUM 7.9* 7.4* 7.8* 8.0* 8.0*  MG  --   --   --   --  1.7  PHOS  --   --   --   --  2.9   Liver Function Tests: Recent Labs  Lab 12/16/20 0854  AST 20  ALT 10  ALKPHOS 77  BILITOT 0.5  PROT 6.1*  ALBUMIN 2.0*   No results for input(s): LIPASE, AMYLASE in the last 168 hours. No results for input(s): AMMONIA in the last 168 hours. CBC: Recent Labs  Lab 12/12/20 0203 12/13/20 0145 12/14/20 0222 12/15/20 0257 12/16/20 0854  WBC 9.1 9.0 9.4 9.2 7.9  NEUTROABS  --   --   --   --  5.4  HGB 9.3* 8.8* 9.1* 8.4* 8.8*  HCT 27.5* 27.1* 28.0* 25.7* 26.9*  MCV 93.9 93.8 94.6 94.5 95.1  PLT 168 160 203 204 242   Cardiac Enzymes: No results for input(s): CKTOTAL, CKMB, CKMBINDEX, TROPONINI in the last 168 hours. BNP: Invalid input(s): POCBNP CBG: Recent Labs  Lab 12/15/20 2313 12/16/20 0629 12/16/20 0739  12/16/20 1202 12/16/20 1530  GLUCAP 108* 113* 132* 111* 183*   D-Dimer No results for input(s): DDIMER in the last 72 hours. Hgb A1c No results for input(s): HGBA1C in the last 72 hours. Lipid Profile No results for input(s): CHOL, HDL, LDLCALC, TRIG, CHOLHDL, LDLDIRECT in the last 72 hours. Thyroid function studies No results for input(s): TSH, T4TOTAL, T3FREE, THYROIDAB in the last 72 hours.  Invalid input(s): FREET3 Anemia work up No results for input(s): VITAMINB12, FOLATE, FERRITIN, TIBC, IRON, RETICCTPCT in the last 72 hours. Urinalysis    Component Value Date/Time   COLORURINE YELLOW 12/08/2020 1954   APPEARANCEUR CLEAR 12/08/2020 1954   LABSPEC 1.014 12/08/2020 1954   PHURINE 5.0 12/08/2020 [place] NEGATIVE 12/08/2020 [place] NEGATIVE 12/08/2020 1954  BILIRUBINUR NEGATIVE 12/08/2020 [place] NEGATIVE 12/08/2020 1954   PROTEINUR 100 (A) 12/08/2020 1954   UROBILINOGEN 1.0 09/27/2011 1112   NITRITE NEGATIVE 12/08/2020 [place] 12/08/2020 1954   Sepsis Labs Invalid input(s): PROCALCITONIN,  WBC,  LACTICIDVEN Microbiology Recent Results (from the past 240 hour(s))  Blood culture (routine x 2)     Status: Abnormal   Collection Time: 12/08/20  6:05 PM   Specimen: BLOOD  Result Value Ref Range Status   Specimen Description BLOOD RIGHT ANTECUBITAL  Final   Special Requests   Final    BOTTLES DRAWN AEROBIC AND ANAEROBIC Blood Culture adequate volume   Culture  Setup Time   Final    GRAM POSITIVE COCCI IN CLUSTERS IN BOTH AEROBIC AND ANAEROBIC BOTTLES Organism ID to follow CRITICAL RESULT CALLED TO, READ BACK BY AND VERIFIED WITH: A WOLFE PHARMD 1407 12/09/20 A BROWNING Performed at [facility], [place] [address], [place], [place] [address]    Culture STAPHYLOCOCCUS AUREUS (A)  Final   Report Status 12/12/2020 FINAL  Final   Organism ID, Bacteria STAPHYLOCOCCUS AUREUS  Final      Susceptibility   Staphylococcus aureus -  MIC*    CIPROFLOXACIN <=0.5 SENSITIVE Sensitive     ERYTHROMYCIN <=0.25 SENSITIVE Sensitive     GENTAMICIN <=0.5 SENSITIVE Sensitive     OXACILLIN 0.5 SENSITIVE Sensitive     TETRACYCLINE <=1 SENSITIVE Sensitive     VANCOMYCIN 1 SENSITIVE Sensitive     TRIMETH/SULFA <=10 SENSITIVE Sensitive     CLINDAMYCIN <=0.25 SENSITIVE Sensitive     RIFAMPIN <=0.5 SENSITIVE Sensitive     Inducible Clindamycin NEGATIVE Sensitive     * STAPHYLOCOCCUS AUREUS  Blood culture (routine x 2)     Status: Abnormal   Collection Time: 12/08/20  6:05 PM   Specimen: BLOOD  Result Value Ref Range Status   Specimen Description BLOOD LEFT ANTECUBITAL  Final   Special Requests   Final    BOTTLES DRAWN AEROBIC AND ANAEROBIC Blood Culture adequate volume   Culture  Setup Time   Final    GRAM POSITIVE COCCI IN CLUSTERS IN BOTH AEROBIC AND ANAEROBIC BOTTLES CRITICAL VALUE NOTED.  VALUE IS CONSISTENT WITH PREVIOUSLY REPORTED AND CALLED VALUE.    Culture (A)  Final    STAPHYLOCOCCUS AUREUS SUSCEPTIBILITIES PERFORMED ON PREVIOUS CULTURE WITHIN THE LAST 5 DAYS. Performed at [facility], [place] [address], [place], [place] [address]    Report Status 12/12/2020 FINAL  Final  Resp Panel by RT-PCR (Flu A&B, Covid) Nasopharyngeal Swab     Status: None   Collection Time: 12/08/20  6:05 PM   Specimen: Nasopharyngeal Swab; Nasopharyngeal(NP) swabs in vial transport medium  Result Value Ref Range Status   SARS Coronavirus 2 by RT PCR NEGATIVE NEGATIVE Final    Comment: (NOTE) SARS-CoV-2 target nucleic acids are NOT DETECTED.  The SARS-CoV-2 RNA is generally detectable in upper respiratory specimens during the acute phase of infection. The lowest concentration of SARS-CoV-2 viral copies this assay can detect is 138 copies/mL. A negative result does not preclude SARS-Cov-2 infection and should not be used as the sole basis for treatment or other patient management decisions. A negative result may occur with  improper  specimen collection/handling, submission of specimen other than nasopharyngeal swab, presence of viral mutation(s) within the areas targeted by this assay, and inadequate number of viral copies(<138 copies/mL). A negative result must be combined with clinical observations, patient history, and epidemiological  information. The expected result is Negative.  Fact Sheet for Patients:  EntrepreneurPulse.com.au  Fact Sheet for Healthcare Providers:  IncredibleEmployment.be  This test is no t yet approved or cleared by the [place] FDA and  has been authorized for detection and/or diagnosis of SARS-CoV-2 by FDA under an Emergency Use Authorization (EUA). This EUA will remain  in effect (meaning this test can be used) for the duration of the COVID-19 declaration under Section 564(b)(1) of the Act, 21 U.S.C.section 360bbb-3(b)(1), unless the authorization is terminated  or revoked sooner.       Influenza A by PCR NEGATIVE NEGATIVE Final   Influenza B by PCR NEGATIVE NEGATIVE Final    Comment: (NOTE) The Xpert Xpress SARS-CoV-2/FLU/RSV plus assay is intended as an aid in the diagnosis of influenza from Nasopharyngeal swab specimens and should not be used as a sole basis for treatment. Nasal washings and aspirates are unacceptable for Xpert Xpress SARS-CoV-2/FLU/RSV testing.  Fact Sheet for Patients: EntrepreneurPulse.com.au  Fact Sheet for Healthcare Providers: IncredibleEmployment.be  This test is not yet approved or cleared by the [place] FDA and has been authorized for detection and/or diagnosis of SARS-CoV-2 by FDA under an Emergency Use Authorization (EUA). This EUA will remain in effect (meaning this test can be used) for the duration of the COVID-19 declaration under Section 564(b)(1) of the Act, 21 U.S.C. section 360bbb-3(b)(1), unless the authorization is terminated or revoked.  Performed at  [facility], [place] [address] [place], [place] [address]   Blood Culture ID Panel (Reflexed)     Status: Abnormal   Collection Time: 12/08/20  6:05 PM  Result Value Ref Range Status   Enterococcus faecalis NOT DETECTED NOT DETECTED Final   Enterococcus Faecium NOT DETECTED NOT DETECTED Final   Listeria monocytogenes NOT DETECTED NOT DETECTED Final   Staphylococcus species DETECTED (A) NOT DETECTED Final    Comment: CRITICAL RESULT CALLED TO, READ BACK BY AND VERIFIED WITH: A WOLFE PHARMD 1407 12/09/20 A BROWNING    Staphylococcus aureus (BCID) DETECTED (A) NOT DETECTED Final    Comment: CRITICAL RESULT CALLED TO, READ BACK BY AND VERIFIED WITH: A WOLFE PHARMD 1407 12/09/20 A BROWNING    Staphylococcus epidermidis NOT DETECTED NOT DETECTED Final   Staphylococcus lugdunensis NOT DETECTED NOT DETECTED Final   Streptococcus species NOT DETECTED NOT DETECTED Final   Streptococcus agalactiae NOT DETECTED NOT DETECTED Final   Streptococcus pneumoniae NOT DETECTED NOT DETECTED Final   Streptococcus pyogenes NOT DETECTED NOT DETECTED Final   A.calcoaceticus-baumannii NOT DETECTED NOT DETECTED Final   Bacteroides fragilis NOT DETECTED NOT DETECTED Final   Enterobacterales NOT DETECTED NOT DETECTED Final   Enterobacter cloacae complex NOT DETECTED NOT DETECTED Final   Escherichia coli NOT DETECTED NOT DETECTED Final   Klebsiella aerogenes NOT DETECTED NOT DETECTED Final   Klebsiella oxytoca NOT DETECTED NOT DETECTED Final   Klebsiella pneumoniae NOT DETECTED NOT DETECTED Final   Proteus species NOT DETECTED NOT DETECTED Final   Salmonella species NOT DETECTED NOT DETECTED Final   Serratia marcescens NOT DETECTED NOT DETECTED Final   Haemophilus influenzae NOT DETECTED NOT DETECTED Final   Neisseria meningitidis NOT DETECTED NOT DETECTED Final   Pseudomonas aeruginosa NOT DETECTED NOT DETECTED Final   Stenotrophomonas maltophilia NOT DETECTED NOT DETECTED Final   Candida albicans NOT  DETECTED NOT DETECTED Final   Candida auris NOT DETECTED NOT DETECTED Final   Candida glabrata NOT DETECTED NOT DETECTED Final   Candida krusei NOT DETECTED NOT DETECTED Final  Candida parapsilosis NOT DETECTED NOT DETECTED Final   Candida tropicalis NOT DETECTED NOT DETECTED Final   Cryptococcus neoformans/gattii NOT DETECTED NOT DETECTED Final   Meth resistant mecA/C and MREJ NOT DETECTED NOT DETECTED Final    Comment: Performed at [facility], [address] [place], [place] [address]  Culture, blood (routine x 2)     Status: None   Collection Time: 12/11/20  4:36 AM   Specimen: BLOOD LEFT HAND  Result Value Ref Range Status   Specimen Description BLOOD LEFT HAND  Final   Special Requests   Final    BOTTLES DRAWN AEROBIC AND ANAEROBIC Blood Culture adequate volume   Culture   Final    NO GROWTH 5 DAYS Performed at [facility], [place] [address], [place], [place] [address]    Report Status 12/16/2020 FINAL  Final  Culture, blood (routine x 2)     Status: None   Collection Time: 12/11/20  4:37 AM   Specimen: BLOOD RIGHT HAND  Result Value Ref Range Status   Specimen Description BLOOD RIGHT HAND  Final   Special Requests   Final    BOTTLES DRAWN AEROBIC AND ANAEROBIC Blood Culture adequate volume   Culture   Final    NO GROWTH 5 DAYS Performed at [facility], [place] [address] [place], [place] [address]    Report Status 12/16/2020 FINAL  Final  MRSA PCR Screening     Status: None   Collection Time: 12/13/20  3:06 PM   Specimen: Nasal Mucosa; Nasopharyngeal  Result Value Ref Range Status   MRSA by PCR NEGATIVE NEGATIVE Final    Comment:        The GeneXpert MRSA Assay (FDA approved for NASAL specimens only), is one component of a comprehensive MRSA colonization surveillance program. It is not intended to diagnose MRSA infection nor to guide or monitor treatment for MRSA infections. Performed at [facility], [place] [address] [place], [place]  [address]   Culture, blood (routine x 2)     Status: None (Preliminary result)   Collection Time: 12/15/20  9:14 AM   Specimen: BLOOD  Result Value Ref Range Status   Specimen Description BLOOD LEFT ANTECUBITAL  Final   Special Requests   Final    BOTTLES DRAWN AEROBIC AND ANAEROBIC Blood Culture adequate volume   Culture   Final    NO GROWTH < 24 HOURS Performed at [facility], [place] [address], [place], [place] [address]    Report Status PENDING  Incomplete  Culture, blood (routine x 2)     Status: None (Preliminary result)   Collection Time: 12/15/20  9:20 AM   Specimen: BLOOD LEFT HAND  Result Value Ref Range Status   Specimen Description BLOOD LEFT HAND  Final   Special Requests   Final    BOTTLES DRAWN AEROBIC AND ANAEROBIC Blood Culture results may not be optimal due to an inadequate volume of blood received in culture bottles   Culture   Final    NO GROWTH < 24 HOURS Performed at [place] [facility], [place] [address] [place],  [address]    Report Status PENDING  Incomplete   Time coordinating discharge: 35 minutes  SIGNED:  Kerney Elbe, DO [facility] 12/16/2020, 7:13 PM Pager is on [place]  If 7PM-7AM, please contact night-coverage [email or web address]

## 2020-12-16 NOTE — Progress Notes (Signed)
Peripherally Inserted Central Catheter Placement  The IV Nurse has discussed with the patient and/or persons authorized to consent for the patient, the purpose of this procedure and the potential benefits and risks involved with this procedure.  The benefits include less needle sticks, lab draws from the catheter, and the patient may be discharged home with the catheter. Risks include, but not limited to, infection, bleeding, blood clot (thrombus formation), and puncture of an artery; nerve damage and irregular heartbeat and possibility to perform a PICC exchange if needed/ordered by physician.  Alternatives to this procedure were also discussed.  Bard Power PICC patient education guide, fact sheet on infection prevention and patient information card has been provided to patient /or left at bedside.    PICC Placement Documentation  PICC Single Lumen 97/58/83 PICC Right Basilic 43 cm 0 cm (Active)  Indication for Insertion or Continuance of Line Home intravenous therapies (PICC only) 12/16/20 1112  Exposed Catheter (cm) 0 cm 12/16/20 1112  Site Assessment Clean;Dry;Intact 12/16/20 1112  Line Status Flushed;Saline locked;Blood return noted 12/16/20 1112  Dressing Type Transparent 12/16/20 1112  Dressing Status Clean;Dry;Intact 12/16/20 1112  Antimicrobial disc in place? Yes 12/16/20 1112  Dressing Intervention New dressing 12/16/20 1112  Dressing Change Due 12/23/20 12/16/20 1112       Gordan Payment 12/16/2020, 11:14 AM

## 2020-12-16 NOTE — Progress Notes (Signed)
Received notice from Central Telemetry that pt HR dropped down to 36. Pt was sleeping. He was awoken and is asymptomatic. Will continue to monitor closely.

## 2020-12-16 NOTE — TOC Progression Note (Signed)
Transition of Care [facility]) - Progression Note    Patient Details  Name: Shane [name] MRN: [id number] Date of Birth: Aug 07, 1941  Transition of Care [facility]) CM/SW Contact  Graves-Bigelow, Ocie Cornfield, RN Phone Number: 12/16/2020, 12:20 PM  Clinical Narrative:  Benefits check submitted for Eliquis. Case Manager will follow for cost. Case Manager reached out to [place]. Liaison will speak with the patient today for education and [facility] will do the [place]. Case Manager will continue to follow for additional transition of care needs.    Expected Discharge Plan: [place] Barriers to Discharge: Continued Medical Work up  Expected Discharge Plan and Services Expected Discharge Plan: [place]   Discharge Planning Services: CM Consult Post Acute Care Choice: [place] arrangements for the past 2 months: Single Family Home                 DME Arranged: N/A DME Agency: NA       HH Arranged: RN,PT,OT [place] Agency: [place] Date [place]: 12/15/20 Time [place]: 9150 Representative spoke with at [place]: Tommi Rumps at [facility] [facility] [place]   Readmission Risk Interventions No flowsheet data found.

## 2020-12-16 NOTE — TOC Benefit Eligibility Note (Signed)
Transition of Care [facility]) Benefit Eligibility Note    Patient Details  Name: Shane [name] MRN: [id number] Date of Birth: Jan 10, 1941   Medication/Dose: Eliquis 5mg . Bid for 30 day supply  Covered?: Yes  Tier: 3 Drug  Prescription Coverage [facility]  Spoke with Person/Company/Phone Number:: [employer]anch [employer] . [employer]. PH# [phone]  Co-Pay: $47.00  Prior Approval: No  Deductible: Unmet       Shelda Altes Phone Number: 12/16/2020, 3:11 PM

## 2020-12-18 ENCOUNTER — Telehealth: Payer: Self-pay | Admitting: Cardiology

## 2020-12-18 NOTE — Telephone Encounter (Signed)
He is more than 10 lb above hospital weight. Please increase the furosemide to 80 mg daily for Saturday and Sunday, make sure he is on KCl 40 mEq daily (rather than just 20). Record weight every morning and call us back on Monday.  If breathing worsens, call 911 to bring him to ED.

## 2020-12-18 NOTE — Telephone Encounter (Signed)
Clair Gulling, physical therapist with [facility] called to speak with triage regarding patients increase in weight and swelling   Pt c/o swelling: STAT is pt has developed SOB within 24 hours  1) How much weight have you gained and in what time span?  Unsure   2) If swelling, where is the swelling located?  Swelling from knees down   3) Are you currently taking a fluid pill?  Yes, took it today   4) Are you currently SOB?  Yes with exertion   5) Do you have a log of your daily weights (if so, list)?  n/a  6) Have you gained 3 pounds in a day or 5 pounds in a week?  Patient weight today: 172, patient gave Clair Gulling several different numbers from over last week.   7) Have you traveled recently?  No

## 2020-12-18 NOTE — Telephone Encounter (Signed)
Spoke to wife _ [name] instruction given  Per Dr Sallyanne Kuster order.  she wrote directions and verbalized understanding.   80 mg Furosemide on Saturday and Sunday   40 meq of potassium both days as well  weight every morning  Call on Monday with progress report.  aware if become worse call 911 go to ER.

## 2020-12-18 NOTE — Telephone Encounter (Signed)
Spoke to physical therapist Clair Gulling , who is seeing patient for the first time since discharge.  patient and wife states  He has increase edema since discharge from hospital @ 2 days ago . Patient unable to lay back in the recliner without shortness of breath , dry cough for last day . Patient does use c Pap at night . And if he lay down with machine he is able to sleep .     Blood pressure  120/70 , 120/60.  Short of breathe with movement- weight today is 273 lbs.   Patient is taking lasix 40 mg daily - patient has taken dose today .   will defer to D.O.D

## 2020-12-20 LAB — CULTURE, BLOOD (ROUTINE X 2)
Culture: NO GROWTH
Culture: NO GROWTH
Special Requests: ADEQUATE

## 2020-12-21 ENCOUNTER — Ambulatory Visit: Payer: Medicare Other | Admitting: Cardiology

## 2020-12-22 ENCOUNTER — Telehealth: Payer: Self-pay | Admitting: Physician Assistant

## 2020-12-22 ENCOUNTER — Other Ambulatory Visit: Payer: Self-pay

## 2020-12-22 DIAGNOSIS — I5032 Chronic diastolic (congestive) heart failure: Secondary | ICD-10-CM | POA: Insufficient documentation

## 2020-12-22 DIAGNOSIS — I5022 Chronic systolic (congestive) heart failure: Secondary | ICD-10-CM | POA: Insufficient documentation

## 2020-12-22 DIAGNOSIS — I739 Peripheral vascular disease, unspecified: Secondary | ICD-10-CM

## 2020-12-22 DIAGNOSIS — I495 Sick sinus syndrome: Secondary | ICD-10-CM | POA: Insufficient documentation

## 2020-12-22 DIAGNOSIS — Z8616 Personal history of COVID-19: Secondary | ICD-10-CM | POA: Insufficient documentation

## 2020-12-22 NOTE — Telephone Encounter (Signed)
   She had called because her husband is gaining weight and she is concerned.  She thinks his weight is up about 10 pounds in the last 3 days.  Based on previous phone notes, she started giving him the 80 mg of Lasix daily and 2 potassium tablets.  However, he still had lower extremity edema and she continued that dose.  The home health RN came today and agreed that he was having worsening lower extremity edema and drew some blood. The results are not available.  On review of his p.o. intake, she is watching the sodium very carefully. However, he was drinking Gatorade at times. Also, his total liquid intake seems to be well in excess of 2 L daily.  Plan: Continue Lasix 80 mg daily and K-Dur 20 meq x 2 tabs daily. Take an extra dose of Lasix 80 mg tonight with potassium.  Limit all liquids to approximately 1.5 L/quarts daily. Discontinue Gatorade.  Will route this message to [name] and Fredia Beets RN so that he can get a follow-up phone call or even better, an appointment later this week or next week.  Ms. Bas understands that if things become too much for her, she will have to take him to the emergency room.  She and her husband are in agreement with the plan of care.  Rosaria Ferries, PA-C 12/22/2020 6:58 PM

## 2020-12-23 ENCOUNTER — Telehealth: Payer: Self-pay | Admitting: Cardiology

## 2020-12-23 NOTE — Telephone Encounter (Signed)
Herbert Deaner physical therapist with [facility] calling to confirm medication changes. He states he was informed the patients lasix and potasium were. He would like to know the mg and frequency. He states the patient is also having orthostatic hypotension. He states the patients BP seated was 140/70 and standing was 140/55. HE states the patient is having intermitten dizziness and room spinning when laying down. He states the patient informed him he has vertigo with a vascular or nerve element. He would like some clarification on that. Phone: [phone]

## 2020-12-23 NOTE — Telephone Encounter (Signed)
Shane [name] returning call.

## 2020-12-23 NOTE — Telephone Encounter (Signed)
Attempted to return call to patient's wife. Her VM states she does not like messages left and to keep trying to call her. Her VM is full  Of note, patient has appointment with Oda Kilts PA this week - concerned about if this will remain as is with weather?

## 2020-12-23 NOTE — Telephone Encounter (Signed)
Wife of the patient called asking for clarification as to how long to double dosages of lasix and potassium.  Please assist

## 2020-12-23 NOTE — Telephone Encounter (Signed)
Spoke with Shane [name] PT with [name] message from 12/22/20 per [name] PA He states wife was under impression he was to continue increased lasix dose for a few days(?)  Shane [name] states patient has orthostatic hypotension, with diastolic drops of 32+ points. This occurred last week also BP seated 140/70 BP standing 140/55  Shane [name] states patient has dizziness when lying down, lasting about 30 secs but has vertigo He has intermittent dizziness when up  He has an appt for a device removal follow up on Jan 21 with [name] PA  Routed update on BP concerns to [name]

## 2020-12-23 NOTE — Telephone Encounter (Signed)
Left message for Clair Gulling, PT with [facility] to call back   Per chart review, patient's wife called in after hours 12/22/2020 Plan per [name] PA:  Plan: Continue Lasix 80 mg daily and K-Dur 20 meq x 2 tabs daily. Take an extra dose of Lasix 80 mg tonight with potassium.

## 2020-12-23 NOTE — Telephone Encounter (Signed)
Schedule APPov Kirk Ruths

## 2020-12-23 NOTE — Telephone Encounter (Signed)
Continue higher dose lasix until seen in clinic ]Shane [name]

## 2020-12-25 ENCOUNTER — Other Ambulatory Visit: Payer: Self-pay

## 2020-12-25 ENCOUNTER — Ambulatory Visit: Payer: Medicare Other | Admitting: Student

## 2020-12-25 ENCOUNTER — Telehealth: Payer: Self-pay

## 2020-12-25 ENCOUNTER — Telehealth: Payer: Self-pay | Admitting: Internal Medicine

## 2020-12-25 ENCOUNTER — Encounter (HOSPITAL_BASED_OUTPATIENT_CLINIC_OR_DEPARTMENT_OTHER): Payer: Medicare Other | Attending: Internal Medicine | Admitting: Internal Medicine

## 2020-12-25 DIAGNOSIS — I480 Paroxysmal atrial fibrillation: Secondary | ICD-10-CM | POA: Diagnosis not present

## 2020-12-25 DIAGNOSIS — E11621 Type 2 diabetes mellitus with foot ulcer: Secondary | ICD-10-CM | POA: Insufficient documentation

## 2020-12-25 DIAGNOSIS — E1142 Type 2 diabetes mellitus with diabetic polyneuropathy: Secondary | ICD-10-CM | POA: Diagnosis not present

## 2020-12-25 DIAGNOSIS — L89322 Pressure ulcer of left buttock, stage 2: Secondary | ICD-10-CM | POA: Diagnosis not present

## 2020-12-25 DIAGNOSIS — E11622 Type 2 diabetes mellitus with other skin ulcer: Secondary | ICD-10-CM | POA: Insufficient documentation

## 2020-12-25 DIAGNOSIS — L89152 Pressure ulcer of sacral region, stage 2: Secondary | ICD-10-CM | POA: Diagnosis not present

## 2020-12-25 DIAGNOSIS — I509 Heart failure, unspecified: Secondary | ICD-10-CM | POA: Insufficient documentation

## 2020-12-25 DIAGNOSIS — L97519 Non-pressure chronic ulcer of other part of right foot with unspecified severity: Secondary | ICD-10-CM | POA: Insufficient documentation

## 2020-12-25 DIAGNOSIS — J84112 Idiopathic pulmonary fibrosis: Secondary | ICD-10-CM | POA: Insufficient documentation

## 2020-12-25 DIAGNOSIS — E1151 Type 2 diabetes mellitus with diabetic peripheral angiopathy without gangrene: Secondary | ICD-10-CM | POA: Diagnosis not present

## 2020-12-25 DIAGNOSIS — Z951 Presence of aortocoronary bypass graft: Secondary | ICD-10-CM | POA: Diagnosis not present

## 2020-12-25 DIAGNOSIS — I110 Hypertensive heart disease with heart failure: Secondary | ICD-10-CM | POA: Insufficient documentation

## 2020-12-25 NOTE — Telephone Encounter (Signed)
Spoke with wife who had called and was told to continue previous dose of Lasix. Per wife patients weight and swelling stable, no change in breathing.  Has limited his fluid intake  Patient had labs and was told by lab potassium high but not sure of numbers and unable to see in Epic. She will reach out to agency who obtained labs  Discussed with Dr Harrell Gave and will have patient continue lower dose until seen next week, call back if any further issues

## 2020-12-25 NOTE — Telephone Encounter (Signed)
Dr Juleen China  Thanks for taking care of my long time patient Draden Cottingham . You are seeing him in a few days. 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 He is on open label. His next scheduled infusion ins 01/04/21.  Please see below profile. Want to make sure that you do not have any objections with his IV abx and this. To our knowledge no known interactions   Thanks  MR    SIGNATURE    Dr. Brand Males, M.D., F.C.C.P,  Pulmonary and Critical Care Medicine Staff Physician, [place] Director - Interstitial Lung Disease  Program  Pulmonary [place] [facility] [place], [place], [address]  Pager: [phone], If no answer  OR between  19:00-7:00h: page [phone] Telephone (clinical office): [phone] Telephone (research): [phone]  3:13 PM 12/25/2020     xxxxxxxxxxxxxxxxxxxxxxxxxxxxxxxxxxxxxxxxxxxxxxxxxxx  Title: [id number] (FibroGen Study) is a Phase 3, randomized, double-blind, placebo-controlled multicenter international study to evaluate evaluate the efficacy and safety of 30 mg/kg IV infusions of pamrevlumab administered every 3 weeks for 52 weeks as compared to placebo in subjects with Idiopathic Pulmonary Fibrosis. Primary end point is: change in FVC from baseline at week 52. ZEPHYRUS STUDY  Protocol #: [id number], Clinical Trials #: [id number] Sponsor: [email or web address]  ([place], [place], [place])   Nature conservation officer Features of Pamrevlumab ([id number]) the study drug: a recombinant fully human IgG kappa monoclonal antibody that binds to CTGF and is being developed for treatment of diseases in which tissue fibrosis has a major pathogenic role. In particular, pamrevlumab appears to disrupt a CTGF autocrine loop in mesenchymal cells like myofibroblasts that reduces their recruitment of leukocytes like macrophages, mast and dendritic cells via chemokine secretion. This disruption  results in collapse of the cellular crosstalk that drives tissue remodeling.    Safety Data Amendment 1.0. 18 February 2018 624 subjects have been exposed to pamrevlumab, 270 with IPF The most common TEAEs in all subjects with IPF: Cough, fatigue, dyspnea, upper respiratory tract infection, bronchitis, nasopharyngitis No known effect on qtc prolongation, renal or hepatic issues   Phase 1 study Study [id number], n=21 Enrolled 21 subjects with IPF No dose-limiting toxicities All adverse events were considered mild to moderate 76% of subjects experienced at least 1 TEAE The most common TEAEs: Pyrexia (n=3, 14% of subjects) Cough (n=3, 14% of subjects) Dyspnea (n=3, 14% of subjects) Respiratory tract infection (n=2, 9% of subjects)   Phase 2 study Study [id number], n=90 Enrolled 90 subjects with IPF 14 deaths occurred, 13 were deemed to be related to IPF 20% of subjects experienced a TEAE that led to study drug discontinuation IPF and respiratory failure were the two most common reasons for discontinuation; occurring in 8% and 3% of patients, respectively The most common TEAEs: Cough (n=34, 38% of subjects) Dyspnea (n=24, 27% of subjects) Fatigue (n=24, 27% of subjects) Nasopharyngitis (n=20, 22% of subjects) Respiratory tract infection (n=19, 21% of subjects) Bronchitis (n=18, 20% of subjects)   Phase 2 study Study [id number], n=103 103 subjects enrolled with IPF 9 deaths occurred; 4 deemed related to IPF, 5 related to other respiratory causes 18% of subjects experienced a TEAE that led to study drug discontinuation The most common TEAEs: Cough (n=48, 47% of subjects) Respiratory tract infection (n=39, 38% of subjects) IPF (n=32, 31% of subjects) Dysnpea (n=30, 29% of subjects) Sinusitis (n=21, 21% of subjects) Fatigue (n=20, 19% of subjects)   Xxxxxxxxxxxxxxxxxxxxx  [facility] meeting Aug 2021 - cleared to continue Xxxxxxxxxxxxxx Updates - Sept 2021 - 1 anaphylactic  reaction on 10th infusion on [place] - causally related - 2.04% incidence of PE (between IPF 6 patients, and 8 with pancreatic cancer of 685 in studye with 562 thought to be on IP) - possible cause by IP but sponsor believes no changes to study program xxxxxxxxxxxxxxxxxxxxxxxxxxxxxxxx   Overall, pamrevlumab has been well tolerated. Infusion-related reactions have been reported at a rate that is consistent with other human monoclonal antibodies. Based on the mechanism of action of pamrevlumab, by inhibiting CTGF, there was some concern that this would cause impaired wound healing or impaired bone fracture healing. However, there were no serious adverse events reported in any study relating to these two issues.

## 2020-12-25 NOTE — Telephone Encounter (Signed)
Patient's wife called, she would like post op appt sooner. Patient has had a wound on his foot, but the MD who is following that wound will not intervene until he is seen by our office. Moved patient up one week. Wife says she will continue to call to see if there are cancellations.

## 2020-12-28 NOTE — Telephone Encounter (Signed)
Hi Dr Chase Caller,  Likewise, I am not aware of any known interactions.  I have no objections to his IV abx and therapy he is getting through your research.   Thanks, Mitzi Hansen

## 2020-12-29 ENCOUNTER — Encounter: Payer: Self-pay | Admitting: Internal Medicine

## 2020-12-29 ENCOUNTER — Ambulatory Visit (INDEPENDENT_AMBULATORY_CARE_PROVIDER_SITE_OTHER): Payer: Medicare Other | Admitting: Internal Medicine

## 2020-12-29 ENCOUNTER — Other Ambulatory Visit: Payer: Self-pay

## 2020-12-29 VITALS — BP 127/66 | HR 64 | Temp 98.4°F | Ht 72.0 in

## 2020-12-29 DIAGNOSIS — Z5181 Encounter for therapeutic drug level monitoring: Secondary | ICD-10-CM

## 2020-12-29 DIAGNOSIS — I739 Peripheral vascular disease, unspecified: Secondary | ICD-10-CM | POA: Insufficient documentation

## 2020-12-29 DIAGNOSIS — Z959 Presence of cardiac and vascular implant and graft, unspecified: Secondary | ICD-10-CM | POA: Diagnosis not present

## 2020-12-29 DIAGNOSIS — I330 Acute and subacute infective endocarditis: Secondary | ICD-10-CM

## 2020-12-29 DIAGNOSIS — B9561 Methicillin susceptible Staphylococcus aureus infection as the cause of diseases classified elsewhere: Secondary | ICD-10-CM

## 2020-12-29 DIAGNOSIS — R7881 Bacteremia: Secondary | ICD-10-CM | POA: Diagnosis not present

## 2020-12-29 DIAGNOSIS — E13621 Other specified diabetes mellitus with foot ulcer: Secondary | ICD-10-CM

## 2020-12-29 DIAGNOSIS — L97519 Non-pressure chronic ulcer of other part of right foot with unspecified severity: Secondary | ICD-10-CM

## 2020-12-29 NOTE — Progress Notes (Signed)
[facility] for Infectious Disease  Reason for Consult: Hospital follow-up for MSSA bacteremia and endocarditis  Referring Provider: Hospital follow-up   HPI:    Shane [name] is a 80 y.o. male who presents to clinic for follow-up after recent hospitalization January 4 through December 16, 2020 for MSSA bacteremia and endocarditis.     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  His blood cultures were initially positive for MSSA on January 4 in both sets of blood cultures.  He then had negative cultures drawn on both January 7 and January 11.  For evaluation of his right foot wound he underwent CT scan which showed no evidence of abscess or osteomyelitis.  Orthopedic surgery recommended conservative management and he was also evaluated by vascular surgery.  He underwent aortogram of right lower extremity with placement of a superficial femoral and above-knee popliteal drug-eluting stent.  He also underwent pacemaker removal on January 10.  PICC line was placed and he was arranged to receive cefazolin for 6 weeks of therapy from date of pacemaker removal through January 25, 2021.  Labs from January 18 show WBC 8.5, hemoglobin 8.8, platelets 401.  BUN 60, creatinine 1.6, GFR 40, potassium 5.7.  This is overall stable from his labs while in the hospital.  His wife has been in communication with his cardiology office regarding dosing of his Lasix as well as his potassium supplements.   Today he reports doing well.  He saw wound care for his foot wound and reports improvement since then.  His graft site developed some irritation/redness that has since resolved  with ointment provided by his PCP.  He has no issues with PICC line or administering his IV antibiotics.   Patient's Medications  New Prescriptions   No medications on file  Previous Medications   ALBUTEROL (VENTOLIN HFA) 108 (90 BASE) MCG/ACT INHALER    Inhale 2 puffs into the lungs every 4 (four) hours as needed for wheezing or shortness of breath.   APIXABAN (ELIQUIS) 5 MG TABS TABLET    Take 1 tablet (5 mg total) by mouth 2 (two) times daily.   ASCORBIC ACID (VITAMIN C WITH ROSE HIPS) 500 MG TABLET    Take 500 mg by mouth daily.   ASPIRIN EC 81 MG TABLET    Take 81 mg by mouth daily.   ATORVASTATIN (LIPITOR) 40 MG TABLET    Take 40 mg by mouth at bedtime.   CEFAZOLIN (ANCEF) IVPB    Inject 2 g into the vein every 8 (eight) hours. Indication:  Endocarditis First Dose: Yes Last Day of Therapy:  01/25/21 Labs - Once weekly:  CBC/D and BMP, Labs - Every other week:  ESR and CRP Method of administration: IV Push Method of administration may be changed at the discretion of home infusion pharmacist based upon assessment of the patient and/or caregiver's ability to self-administer the medication ordered.   CLOTRIMAZOLE-BETAMETHASONE (LOTRISONE) CREAM    Apply 1 application topically daily as needed (for skin infection).    CONTINUOUS BLOOD GLUC SENSOR (FREESTYLE LIBRE 14 DAY SENSOR) MISC    1 patch every 14 (fourteen) days.   [employer] ACETAMINOPHEN 325 MG TABLET  Take 650 mg by mouth daily as needed for pain.   ENSURE MAX PROTEIN (ENSURE MAX PROTEIN) LIQD    Take 330 mLs (11 oz total) by mouth 2 (two) times daily.   EPINEPHRINE 0.3 MG/0.3 ML IJ SOAJ INJECTION    Inject 0.3 mg into the muscle once as needed for anaphylaxis (max 3 doses).   FUROSEMIDE (LASIX) 40 MG TABLET    Take 40 mg by mouth daily.   INSULIN LISPRO (HUMALOG) 100 UNIT/ML INJECTION    Inject 0.07 mLs (7 Units total) into the skin 3 (three) times daily with meals.   LIVER OIL-ZINC OXIDE (DESITIN) 40 % OINTMENT    Apply 1 application  topically in the morning and at bedtime.   LORAZEPAM (ATIVAN) 1 MG TABLET    Take 1 mg by mouth at bedtime as needed for sleep.    LOSARTAN (COZAAR) 50 MG TABLET    Take 50 mg by mouth daily.   MAGNESIUM OXIDE (MAG-OX) 400 MG TABLET    TAKE 1 CAPSULE BY MOUTH ONCE DAILY   METOPROLOL TARTRATE (LOPRESSOR) 50 MG TABLET    Take 25 mg by mouth 2 (two) times daily.   MULTIPLE VITAMIN (MULTIVITAMIN WITH MINERALS) TABS TABLET    Take 1 tablet by mouth daily.   MUPIROCIN OINTMENT (BACTROBAN) 2 %    SMARTSIG:1 Application Topical 2-3 Times Daily   PANTOPRAZOLE (PROTONIX) 40 MG TABLET    Take 40 mg by mouth daily.   POLYETHYLENE GLYCOL POWDER (GLYCOLAX/MIRALAX) 17 GM/SCOOP POWDER    Take 17 g by mouth daily.   POTASSIUM CHLORIDE SA (KLOR-CON) 20 MEQ TABLET    Take 20-40 mEq by mouth See admin instructions. Takes 2 tablets (40 mEq totally) by mouth for 7 days; then takes 1 tablet (20 mEq totally) by mouth daily after that   PROMETHAZINE-PHENYLEPH-CODEINE (PROMETHAZINE VC/CODEINE) 6.25-5-10 MG/5ML SYRP       SANTYL OINTMENT    Apply topically.   TRESIBA FLEXTOUCH 200 UNIT/ML FLEXTOUCH PEN    Inject 16 Units into the skin at bedtime.  Modified Medications   No medications on file  Discontinued Medications   No medications on file      Past Medical History:  Diagnosis Date  . 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 Cataract   . Chronic kidney disease    d/t DM  . CKD (chronic kidney disease), stage III ([place])   . Diabetes mellitus    Vgo disposal insulin bolus  simular to insulin pump  . Dyspnea   . GERD (gastroesophageal reflux disease)   . Hyperlipidemia   . Hypertension   . 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 ILD (interstitial lung disease) ([place])   . Moderate aortic stenosis    a. 10/2019 Echo: EF 55-60%, Gr2 DD. Nl RV.   Marland Kitchen Neuromuscular disorder ([place])   . Nonobstructive CAD (coronary artery disease)    a. 2012 Cath: mod, nonobs dzs; b. 10/2016 MV: EF 60%, no ischemia.  . OSA on CPAP 05/05/2017    Unattended Home Sleep Test 7/2/813-AHI 38.6/hour, desaturation to 64%, body weight 261 pounds  . PONV (postoperative nausea and vomiting)   . Sleep apnea     uses cpap asked to bring mask and tubing    Social History   Tobacco Use  . Smoking status: Never Smoker  . Smokeless tobacco: Never Used  Vaping Use  . Vaping Use: Never used  Substance Use Topics  . Alcohol use: No  . Drug use: No  Family History  Problem Relation Age of Onset  . Diabetes Mellitus II Mother   . Emphysema Father 75  . Heart attack Father   . Colon cancer Neg Hx   . Esophageal cancer Neg Hx   . Rectal cancer Neg Hx   . Stomach cancer Neg Hx     Allergies  Allergen Reactions  . Codeine Hives and Itching  . Ofev [Nintedanib] Diarrhea    SEVERE DIARRHEA  . Other Diarrhea and Other (See Comments)    Esbriet causes elevated LFTs. D/C on 06/14/17 and SEVERE DIARRHEA    Review of Systems  Constitutional: Negative for chills and fever.  Respiratory: Negative.   Cardiovascular: Negative for chest pain.  Gastrointestinal: Negative.       OBJECTIVE:    Vitals:   12/29/20 1441  BP: 127/66  Pulse: 64  Temp: 98.4 F (36.9 C)  TempSrc: Oral  SpO2: 100%  Height: 6' (1.829 m)     Body mass index is 35.57 kg/m.  Physical Exam Constitutional:      General: He is not in acute distress.    Appearance: Normal appearance.  HENT:     Head: Normocephalic and atraumatic.  Pulmonary:     Effort: Pulmonary effort is normal. No respiratory distress.  Skin:    Comments: Right foot wrapped.   Neurological:     General: No focal deficit present.     Mental Status: He is alert and oriented to person, place, and time.  Psychiatric:        Mood and Affect: Mood normal.        Behavior: Behavior normal.      Labs and Microbiology:  CBC Latest Ref Rng & Units 12/16/2020 12/15/2020 12/14/2020  WBC 4.0 - 10.5 K/uL 7.9 9.2 9.4  Hemoglobin 13.0 - 17.0 g/dL 8.8(L) 8.4(L) 9.1(L)  Hematocrit 39.0 - 52.0  % 26.9(L) 25.7(L) 28.0(L)  Platelets 150 - 400 K/uL 242 204 203   CMP Latest Ref Rng & Units 12/16/2020 12/15/2020 12/12/2020  Glucose 70 - 99 mg/dL 169(H) 147(H) 162(H)  BUN 8 - 23 mg/dL 31(H) 28(H) 31(H)  Creatinine 0.61 - 1.24 mg/dL 1.37(H) 1.41(H) 1.52(H)  Sodium 135 - 145 mmol/L 134(L) 132(L) 134(L)  Potassium 3.5 - 5.1 mmol/L 4.3 4.6 4.2  Chloride 98 - 111 mmol/L 99 100 102  CO2 22 - 32 mmol/L 26 23 23   Calcium 8.9 - 10.3 mg/dL 8.0(L) 8.0(L) 7.8(L)  Total Protein 6.5 - 8.1 g/dL 6.1(L) - -  Total Bilirubin 0.3 - 1.2 mg/dL 0.5 - -  Alkaline Phos 38 - 126 U/L 77 - -  AST 15 - 41 U/L 20 - -  ALT 0 - 44 U/L 10 - -     No results found for this or any previous visit (from the past 240 hour(s)).  Imaging: CT right foot w/o contrast 12/09/2020:  IMPRESSION: Negative for CT evidence of osteomyelitis, abscess or septic joint.  Skin ulceration adjacent to the fifth metatarsal. No abscess is identified. Subcutaneous edema about the dorsum of the foot is compatible with cellulitis or dependent change.  Mildly thickened Achilles tendinopathy compatible with insertional tendinosis without tear.   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  Currently doing well on cefazolin via PICC line planned through 01/25/21.  No issues with PICC line to this point.  Reports right foot wound is healing at this time and he is  following with the wound care center.  PLAN: . Continue cefazolin and OPAT lab monitoring . Planned end of therapy 01/25/21 . Follow up scheduled with EP to discuss any future need for device reimplantation . Follow up with me in about 3 weeks prior to end of therapy   Peripheral vascular disease ([place]) He underwent intervention during recent hospitalization with superficial femoral/above knee popliteal stenting on 12/10/2020 to help improve blood flow to his lower extremity.     PLAN: . Continued follow up with vascular surgery   Encounter for therapeutic drug monitoring Currently being monitored weekly while on cefazolin.  Elevated K+ noted last week but has been managed by his cardiology office with adjustment of diuretics and potassium supplements.  PLAN: . 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  CT without contrast during admission without evidence of osteomyelitis, abscess, or septic arthritis.  He is now following with wound care and reports improvement.  Of note, was also seen by orthopedic surgery who did not feel a need for surgical intervention during his admission.  PLAN: . Continue antibiotics and wound care . Will discuss with Dr Dellia Nims regarding CT findings and whether MRI would be helpful now that his PPM has been removed   Cardiac device in situ Status post device extraction on 12/14/20 in setting of MSSA bacteremia and concern for device related endocarditis.  PLAN: . Follow up planned with EP to discuss if reimplantation needed in the future.     No orders of the defined types were placed in this encounter.     [facility]ious [facility] 12/29/2020, 3:57 PM

## 2020-12-29 NOTE — Telephone Encounter (Signed)
Dr Juleen China: thanks and reply  Not needed  Apolonio Schneiders of reseach: please print this documentation for records. Ok for [employer]  to continue iwht OLE on study

## 2020-12-29 NOTE — Assessment & Plan Note (Signed)
Status post device extraction on 12/14/20 in setting of MSSA bacteremia and concern for device related endocarditis.  PLAN: . Follow up planned with EP to discuss if reimplantation needed in the future.

## 2020-12-29 NOTE — Assessment & Plan Note (Signed)
Currently being monitored weekly while on cefazolin.  Elevated K+ noted last week but has been managed by his cardiology office with adjustment of diuretics and potassium supplements.  PLAN: . Continue weekly OPAT labs

## 2020-12-29 NOTE — Assessment & Plan Note (Signed)
Right diabetic foot wound also in the setting of PAD status post intervention on 12/10/2020.  CT without contrast during admission without evidence of osteomyelitis, abscess, or septic arthritis.  He is now following with wound care and reports improvement.  Of note, was also seen by orthopedic surgery who did not feel a need for surgical intervention during his admission.  PLAN: . Continue antibiotics and wound care . Will discuss with Dr Dellia Nims regarding CT findings and whether MRI would be helpful now that his PPM has been removed

## 2020-12-29 NOTE — Assessment & Plan Note (Signed)
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  Currently doing well on cefazolin via PICC line planned through 01/25/21.  No issues with PICC line to this point.  Reports right foot wound is healing at this time and he is following with the wound care center.  PLAN: . Continue cefazolin and OPAT lab monitoring . Planned end of therapy 01/25/21 . Follow up scheduled with EP to discuss any future need for device reimplantation . Follow up with me in about 3 weeks prior to end of therapy

## 2020-12-29 NOTE — Patient Instructions (Signed)
Thank you for coming to see me today. It was a pleasure seeing you.  To Do: Marland Kitchen I will touch base with Dr Dellia Nims about needing an MRI . Continue your antibiotics as you have been doing . Continue to follow up with all your other providers . I will see you back the week of February 14 or so   If you have any questions or concerns, please do not hesitate to call the office at [phone].  Take Care,   Jule Ser, DO

## 2020-12-29 NOTE — Assessment & Plan Note (Signed)
He underwent intervention during recent hospitalization with superficial femoral/above knee popliteal stenting on 12/10/2020 to help improve blood flow to his lower extremity.    PLAN: . Continued follow up with vascular surgery

## 2020-12-30 NOTE — Progress Notes (Signed)
EMRE, STOCK ([id number]) Visit Report for 12/25/2020 Abuse/Suicide Risk Screen Details Patient Name: Date of Service: Shane [name] MES H. 12/25/2020 10:30 A M Medical Record Number: [id number] Patient Account Number: [id number] Date of Birth/Sex: Treating RN: Shane [name] (80 y.o. Shane [name], Shane [name] Primary Care Provider: Geoffery [name] Other Clinician: Referring Provider: Treating Provider/Extender: Shane [name], Shane [name] in Treatment: 0 Abuse/Suicide Risk Screen Items Answer ABUSE RISK SCREEN: Has anyone close to you tried to hurt or harm you recentlyo No Do you feel uncomfortable with anyone in your familyo No Has anyone forced you do things that you didnt want to doo No Electronic Signature(s) Signed: 12/30/2020 5:56:51 PM By: Shane Hammock RN Entered By: Shane [name] on 12/25/2020 10:38:45 -------------------------------------------------------------------------------- Activities of Daily Living Details Patient Name: Date of Service: Shane [name] MES H. 12/25/2020 10:30 A M Medical Record Number: [id number] Patient Account Number: [id number] Date of Birth/Sex: Treating RN: Shane [name] (80 y.o. Shane [name], Shane [name] Primary Care Provider: Geoffery [name] Other Clinician: Referring Provider: Treating Provider/Extender: Shane [name], Shane [name] in Treatment: 0 Activities of Daily Living Items Answer Activities of Daily Living (Please select one for each item) Drive Automobile Not Able T Medications ake Completely Able Use T elephone Completely Able Care for Appearance Need Assistance Use T oilet Need Assistance Bath / Shower Need Assistance Dress Self Need Assistance Feed Self Completely Able Walk Need Assistance Get In / Out Bed Need Assistance Housework Need Assistance Prepare Meals Need Assistance Handle Money Need Assistance Shop for Self Need Assistance Electronic Signature(s) Signed: 12/30/2020 5:56:51 PM By:  Shane Hammock RN Entered By: Shane [name] on 12/25/2020 10:39:30 -------------------------------------------------------------------------------- Education Screening Details Patient Name: Date of Service: Shane [name], Shane [name] MES H. 12/25/2020 10:30 A M Medical Record Number: [id number] Patient Account Number: [id number] Date of Birth/Sex: Treating RN: Shane [name] (80 y.o. Shane [name], Shane [name] Primary Care Provider: Geoffery [name] Other Clinician: Referring Provider: Treating Provider/Extender: Shane [name], Shane [name] in Treatment: 0 Primary Learner Assessed: Patient Learning Preferences/Education Level/Primary Language Learning Preference: Explanation, Communication Board, Printed Material Highest Education Level: College or Above Preferred Language: English Cognitive Barrier Language Barrier: No Translator Needed: No Memory Deficit: No Emotional Barrier: No Cultural/Religious Beliefs Affecting Medical Care: No Physical Barrier Impaired Vision: No Impaired Hearing: Yes HOH Decreased Hand dexterity: No Knowledge/Comprehension Knowledge Level: High Comprehension Level: High Ability to understand written instructions: High Ability to understand verbal instructions: High Motivation Anxiety Level: Calm Cooperation: Cooperative Education Importance: Denies Need Interest in Health Problems: Asks Questions Perception: Coherent Willingness to Engage in Self-Management High Activities: Readiness to Engage in Self-Management High Activities: Electronic Signature(s) Signed: 12/30/2020 5:56:51 PM By: Shane Hammock RN Entered By: Shane [name] on 12/25/2020 10:40:37 -------------------------------------------------------------------------------- Fall Risk Assessment Details Patient Name: Date of Service: Shane [name], Shane [name] MES H. 12/25/2020 10:30 A M Medical Record Number: [id number] Patient Account Number: [id number] Date of Birth/Sex: Treating RN: Shane [name]  (79 y.o. Shane [name], Shane [name] Primary Care Provider: Geoffery [name] Other Clinician: Referring Provider: Treating Provider/Extender: Shane [name], Shane [name] in Treatment: 0 Fall Risk Assessment Items Have you had 2 or more falls in the last 12 monthso 0 Yes Have you had any fall that resulted in injury in the last 12 monthso 0 No FALLS RISK SCREEN History of falling - immediate or within 3 months 25 Yes Secondary diagnosis (Do you have 2 or more medical diagnoseso) 0 No Ambulatory aid None/bed rest/wheelchair/nurse 0 Yes Crutches/cane/walker 0 No Furniture 0 No Intravenous  therapy Access/Saline/Heparin Lock 0 No Gait/Transferring Normal/ bed rest/ wheelchair 0 Yes Weak (short steps with or without shuffle, stooped but able to lift head while walking, may seek 10 Yes support from furniture) Impaired (short steps with shuffle, may have difficulty arising from chair, head down, impaired 0 No balance) Mental Status Oriented to own ability 0 Yes Electronic Signature(s) Signed: 12/30/2020 5:56:51 PM By: Shane Hammock RN Entered By: Shane [name] on 12/25/2020 10:41:20 -------------------------------------------------------------------------------- Foot Assessment Details Patient Name: Date of Service: Shane [name], Shane [name] MES H. 12/25/2020 10:30 A M Medical Record Number: [id number] Patient Account Number: [id number] Date of Birth/Sex: Treating RN: Shane [name] (80 y.o. Shane [name], Shane [name] Primary Care Provider: Geoffery [name] Other Clinician: Referring Provider: Treating Provider/Extender: Shane [name], Shane [name] in Treatment: 0 Foot Assessment Items Site Locations + = Sensation present, - = Sensation absent, C = Callus, U = Ulcer R = Redness, W = Warmth, M = Maceration, PU = Pre-ulcerative lesion F = Fissure, S = Swelling, D = Dryness Assessment Right: Left: Other Deformity: No No Prior Foot Ulcer: No No Prior Amputation: No  No Charcot Joint: No No Ambulatory Status: Ambulatory With Help Assistance Device: Wheelchair Gait: Steady Electronic Signature(s) Signed: 12/30/2020 5:56:51 PM By: Shane Hammock RN Entered By: Shane [name] on 12/25/2020 11:13:01 -------------------------------------------------------------------------------- Nutrition Risk Screening Details Patient Name: Date of Service: Shane [name] MES H. 12/25/2020 10:30 A M Medical Record Number: [id number] Patient Account Number: [id number] Date of Birth/Sex: Treating RN: [birth year] (80 y.o. Shane [name], Shane [name] Primary Care Provider: Geoffery [name] Other Clinician: Referring Provider: Treating Provider/Extender: Shane [name], Shane [name] in Treatment: 0 Height (in): 72 Weight (lbs): 256 Body Mass Index (BMI): 34.7 Nutrition Risk Screening Items Score Screening NUTRITION RISK SCREEN: I have an illness or condition that made me change the kind and/or amount of food I eat 0 No I eat fewer than two meals per day 0 No I eat few fruits and vegetables, or milk products 0 No I have three or more drinks of beer, liquor or wine almost every day 0 No I have tooth or mouth problems that make it hard for me to eat 0 No I don't always have enough money to buy the food I need 0 No I eat alone most of the time 0 No I take three or more different prescribed or over-the-counter drugs a day 0 No Without wanting to, I have lost or gained 10 pounds in the last six months 0 No I am not always physically able to shop, cook and/or feed myself 0 No Nutrition Protocols Good Risk Protocol 0 No interventions needed Moderate Risk Protocol High Risk Proctocol Risk Level: Good Risk Score: 0 Electronic Signature(s) Signed: 12/30/2020 5:56:51 PM By: Shane Hammock RN Entered By: Shane [name] on 12/25/2020 10:41:39

## 2020-12-30 NOTE — Progress Notes (Signed)
Shane [name], Shane [name] ([id number]) Visit Report for 12/25/2020 Chief Complaint Document Details Patient Name: Date of Service: Shane [name] MES H. 12/25/2020 10:30 A M Medical Record Number: [id number] Patient Account Number: [id number] Date of Birth/Sex: Treating RN: [birth year] (80 y.o. Shane [name] Primary Care Provider: Geoffery Lyons Other Clinician: Referring Provider: Treating Provider/Extender: Peter Garter, Constance Goltz in Treatment: 0 Information Obtained from: Patient Chief Complaint 12/25/2020; patient is here for review of wounds on his coccyx, left buttock and right lateral fifth metatarsal head in the setting of 2 recent complex hospitalization Electronic Signature(s) Signed: 12/25/2020 4:10:40 PM By: Linton Ham MD Entered By: Linton Ham on 12/25/2020 12:10:48 -------------------------------------------------------------------------------- Debridement Details Patient Name: Date of Service: Shane [name], Shane [name] MES H. 12/25/2020 10:30 A M Medical Record Number: [id number] Patient Account Number: [id number] Date of Birth/Sex: Treating RN: [birth year] (80 y.o. Shane [name] Primary Care Provider: Geoffery Lyons Other Clinician: Referring Provider: Treating Provider/Extender: Peter Garter, Constance Goltz in Treatment: 0 Debridement Performed for Assessment: Wound #1 Right,Lateral Foot Performed By: Clinician Deon Pilling, RN Debridement Type: Chemical/Enzymatic/Mechanical Agent Used: Santyl Severity of Tissue Pre Debridement: Fat layer exposed Level of Consciousness (Pre-procedure): Awake and Alert Pre-procedure Verification/Time Out No Taken: Bleeding: None Response to Treatment: Procedure was tolerated well Level of Consciousness (Post- Awake and Alert procedure): Post Debridement Measurements of Total Wound Length: (cm) 2.1 Width: (cm) 3 Depth: (cm) 0.1 Volume: (cm) 0.495 Character of Wound/Ulcer Post Debridement: Requires  Further Debridement Severity of Tissue Post Debridement: Fat layer exposed Post Procedure Diagnosis Same as Pre-procedure Electronic Signature(s) Signed: 12/25/2020 4:10:40 PM By: Linton Ham MD Signed: 12/25/2020 4:31:51 PM By: Baruch Gouty RN, BSN Signed: 12/25/2020 4:31:51 PM By: Baruch Gouty RN, BSN Entered By: Baruch Gouty on 12/25/2020 11:45:30 -------------------------------------------------------------------------------- HPI Details Patient Name: Date of Service: Shane [name], Shane [name] MES H. 12/25/2020 10:30 A M Medical Record Number: [id number] Patient Account Number: [id number] Date of Birth/Sex: Treating RN: 09/29/1941 (80 y.o. Shane [name] Primary Care Provider: Geoffery Lyons Other Clinician: Referring Provider: Treating Provider/Extender: Peter Garter, Constance Goltz in Treatment: 0 History of Present Illness HPI Description: ADMISSION 12/25/2020 This is a 80 year old man who had an elective admission to [place] in early December for coronary artery disease he underwent a CABG x3. He spent in the morning amount of time in the hospital 15 days according to his wife in 10 days in the ICU but I have not researched this issue. When he left [facility] he apparently had the pressure ulcers on the coccyx and the gluteal area. He was at home for a short period of time however he required readmission to [facility] from 12/08/2020 through 12/16/2020 with increasing fatigue dyspnea fever and cough. 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 The patient is currently on Ancef. During the hospitalization he was discovered to have a low ABI of 0.62 on the right. He went for an angiogram on 12/10/2020 and the patient had stents of the SFA and popliteal lesions. At that time there was complete resolution of the stenosis/occlusion. He had a CT scan of the foot  that did not show evidence of abscess or osteomyelitis. The patient is still on Ancef follows up with infectious disease next week he has a follow-up with Dr. Jerry Caras of vein and vascular on February 8. He does not complain of pain in his  foot however he does have significant diabetic neuropathy. He is using Xeroform to all 3 wounds Past medical history includes type 2 diabetes, peripheral neuropathy, paroxysmal atrial fibrillation with heart blocko Sick sinus syndrome, idiopathic pulmonary fibrosis, congestive heart failure, hypertension witho Postural hypotension, recent CABG at [facility] on 11/04/2020. He no longer has a pacemaker but has had previous cervical spine surgery with hardware at this level We did not redo his ABI in clinic today Electronic Signature(s) Signed: 12/25/2020 4:10:40 PM By: Linton Ham MD Entered By: Linton Ham on 12/25/2020 12:32:14 -------------------------------------------------------------------------------- Physical Exam Details Patient Name: Date of Service: Shane [name] MES H. 12/25/2020 10:30 A M Medical Record Number: [id number] Patient Account Number: [id number] Date of Birth/Sex: Treating RN: [birth year] (80 y.o. Shane [name] Primary Care Provider: Geoffery Lyons Other Clinician: Referring Provider: Treating Provider/Extender: Peter Garter, Constance Goltz in Treatment: 0 Constitutional Sitting or standing Blood Pressure is within target range for patient.. Pulse regular and within target range for patient.Marland Kitchen Respirations regular, non-labored and within target range.. Temperature is normal and within the target range for the patient.Marland Kitchen Appears in no distress. Respiratory work of breathing is normal. Cardiovascular His dorsalis pedis and posterior tibial pulses are reduced but palpable. Psychiatric appears at normal baseline. Notes Wound exam Small wounds stage II pressure ulcers on the lower coccyx and left buttock. Some debris on  the surface but I do elected not to debride this. The more concerning area is on the right lateral fifth metatarsal head covered and completely nonviable surface. This is somewhat boggy. There is also some erythema now on the dorsal lateral foot which I do not think was there previously by pictures on his wife's phone. Electronic Signature(s) Signed: 12/25/2020 4:10:40 PM By: Linton Ham MD Entered By: Linton Ham on 12/25/2020 12:36:37 -------------------------------------------------------------------------------- Physician Orders Details Patient Name: Date of Service: Shane [name], Shane [name] MES H. 12/25/2020 10:30 A M Medical Record Number: [id number] Patient Account Number: [id number] Date of Birth/Sex: Treating RN: 06-25-1941 (80 y.o. Shane [name] Primary Care Provider: Geoffery Lyons Other Clinician: Referring Provider: Treating Provider/Extender: Peter Garter, Constance Goltz in Treatment: 0 Verbal / Phone Orders: No Diagnosis Coding Follow-up Appointments Return Appointment in 1 week. Bathing/ Shower/ Hygiene May shower and wash wound with soap and water. - on days when dressings are changed Edema Control - Lymphedema / SCD / Other Elevate legs to the level of the heart or above for 30 minutes daily and/or when sitting, a frequency of: Avoid standing for long periods of time. Exercise regularly Moisturize legs daily. Off-Loading Turn and reposition every 2 hours - avoid slouching in chair when sitting, weight should be on back of thighs [place] wound care orders this week; continue Home Health for wound care. May utilize formulary equivalent dressing for wound treatment orders unless otherwise specified. Other Home Health Orders/Instructions: - [facility] Wound Treatment Wound #1 - Foot Wound Laterality: Right, Lateral Cleanser: Soap and Water 1 x Per Day/30 Days Discharge Instructions: May shower and wash wound with dial antibacterial soap and water  prior to dressing change. Prim Dressing: Santyl Ointment 1 x Per Day/30 Days ary Discharge Instructions: Apply nickel thick amount to wound bed as instructed Secondary Dressing: Woven Gauze Sponge, Non-Sterile 4x4 in 1 x Per Day/30 Days Discharge Instructions: Apply over primary dressing as directed. Secured With: Child psychotherapist, Sterile 2x75 (in/in) 1 x Per Day/30 Days Discharge Instructions: Secure with stretch gauze as directed. Wound #2 - Coccyx Prim  Dressing: FIBRACOL Plus Dressing, 2x2 in (collagen) Every Other Day/30 Days ary Discharge Instructions: Moisten collagen with saline or hydrogel Secondary Dressing: ComfortFoam Border, 4x4 in (silicone border) [facility]) Every Other Day/30 Days Discharge Instructions: Apply over primary dressing as directed. Wound #3 - Gluteus Wound Laterality: Left Prim Dressing: FIBRACOL Plus Dressing, 2x2 in (collagen) (Home Health) Every Other Day/30 Days ary Discharge Instructions: Moisten collagen with saline or hydrogel Secondary Dressing: ComfortFoam Border, 4x4 in (silicone border) [facility]) Every Other Day/30 Days Discharge Instructions: Apply over primary dressing as directed. Electronic Signature(s) Signed: 12/25/2020 4:10:40 PM By: Linton Ham MD Signed: 12/25/2020 4:31:51 PM By: Baruch Gouty RN, BSN Entered By: Baruch Gouty on 12/25/2020 11:55:07 -------------------------------------------------------------------------------- Problem List Details Patient Name: Date of Service: Shane [name], Shane [name] MES H. 12/25/2020 10:30 A M Medical Record Number: [id number] Patient Account Number: [id number] Date of Birth/Sex: Treating RN: 1941-03-24 (80 y.o. Shane [name] Primary Care Provider: Geoffery Lyons Other Clinician: Referring Provider: Treating Provider/Extender: Peter Garter, Constance Goltz in Treatment: 0 Active Problems ICD-10 Encounter Code Description Active Date MDM Diagnosis E11.621  Type 2 diabetes mellitus with foot ulcer 12/25/2020 No Yes E11.51 Type 2 diabetes mellitus with diabetic peripheral angiopathy without gangrene 12/25/2020 No Yes L97.518 Non-pressure chronic ulcer of other part of right foot with other specified 12/25/2020 No Yes severity L89.152 Pressure ulcer of sacral region, stage 2 12/25/2020 No Yes L89.322 Pressure ulcer of left buttock, stage 2 12/25/2020 No Yes A49.01 Methicillin susceptible Staphylococcus aureus infection, unspecified site 12/25/2020 No Yes Inactive Problems Resolved Problems Electronic Signature(s) Signed: 12/25/2020 4:10:40 PM By: Linton Ham MD Entered By: Linton Ham on 12/25/2020 11:58:06 -------------------------------------------------------------------------------- Progress Note Details Patient Name: Date of Service: Shane [name], Shane [name] MES H. 12/25/2020 10:30 A M Medical Record Number: [id number] Patient Account Number: [id number] Date of Birth/Sex: Treating RN: 04/23/1941 (80 y.o. Shane [name] Primary Care Provider: Geoffery Lyons Other Clinician: Referring Provider: Treating Provider/Extender: Peter Garter, Constance Goltz in Treatment: 0 Subjective Chief Complaint Information obtained from Patient 12/25/2020; patient is here for review of wounds on his coccyx, left buttock and right lateral fifth metatarsal head in the setting of 2 recent complex hospitalization History of Present Illness (HPI) ADMISSION 12/25/2020 This is a 80 year old man who had an elective admission to [place] in early December for coronary artery disease he underwent a CABG x3. He spent in the morning amount of time in the hospital 15 days according to his wife in 10 days in the ICU but I have not researched this issue. When he left [facility] he apparently had the pressure ulcers on the coccyx and the gluteal area. He was at home for a short period of time however he required readmission to [facility] from 12/08/2020 through 12/16/2020 with  increasing fatigue dyspnea fever and cough. 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 The patient is currently on Ancef. During the hospitalization he was discovered to have a low ABI of 0.62 on the right. He went for an angiogram on 12/10/2020 and the patient had stents of the SFA and popliteal lesions. At that time there was complete resolution of the stenosis/occlusion. He had a CT scan of the foot that did not show evidence of abscess or osteomyelitis. The patient is still on Ancef follows up with infectious disease next week he has a follow-up with Dr. Jerry Caras of vein  and vascular on February 8. He does not complain of pain in his foot however he does have significant diabetic neuropathy. He is using Xeroform to all 3 wounds Past medical history includes type 2 diabetes, peripheral neuropathy, paroxysmal atrial fibrillation with heart blocko Sick sinus syndrome, idiopathic pulmonary fibrosis, congestive heart failure, hypertension witho Postural hypotension, recent CABG at [facility] on 11/04/2020. He no longer has a pacemaker but has had previous cervical spine surgery with hardware at this level We did not redo his ABI in clinic today Patient History Information obtained from Patient. Allergies codeine (Severity: Mild) Family History Diabetes - Siblings, Heart Disease - Father, Lung Disease - Father, No family history of Cancer, Hereditary Spherocytosis, Hypertension, Kidney Disease, Seizures, Stroke, Thyroid Problems, Tuberculosis. Social History Never smoker, Marital Status - Married, Alcohol Use - Never, Drug Use - No History, Caffeine Use - Rarely. Medical History Eyes Denies history of Cataracts, Glaucoma, Optic Neuritis Ear/Nose/Mouth/Throat Denies history of Chronic sinus problems/congestion, Middle ear problems Hematologic/Lymphatic Denies history of  Anemia, Hemophilia, Human Immunodeficiency Virus, Lymphedema, Sickle Cell Disease Respiratory Patient has history of Sleep Apnea Denies history of Aspiration, Asthma, Chronic Obstructive Pulmonary Disease (COPD), Pneumothorax, Tuberculosis Cardiovascular Patient has history of Arrhythmia, Coronary Artery Disease, Hypertension, Peripheral Arterial Disease Denies history of Angina, Congestive Heart Failure, Deep Vein Thrombosis, Hypotension, Myocardial Infarction, Peripheral Venous Disease, Phlebitis, Vasculitis Gastrointestinal Denies history of Cirrhosis , Colitis, Crohnoos, Hepatitis A, Hepatitis B, Hepatitis C Endocrine Patient has history of Type II Diabetes Denies history of Type I Diabetes Genitourinary Denies history of End Stage Renal Disease Immunological Denies history of Lupus Erythematosus, Raynaudoos, Scleroderma Integumentary (Skin) Denies history of History of Burn Musculoskeletal Denies history of Gout, Rheumatoid Arthritis, Osteoarthritis, Osteomyelitis Neurologic Patient has history of Neuropathy Denies history of Dementia, Quadriplegia, Paraplegia, Seizure Disorder Oncologic Denies history of Received Chemotherapy, Received Radiation Psychiatric Denies history of Anorexia/bulimia, Confinement Anxiety Patient is treated with Insulin, Oral Agents. Blood sugar is tested. Hospitalization/Surgery History - neck and back surgeries. - CABG 11/2020. - stents 12/2020. - PACEMAKER removal 12/2020. Medical A Surgical History Notes nd Respiratory CPAP Cardiovascular Hx CABG at [facility] on December 1st, 2021 Genitourinary CKD Stage 3 Review of Systems (ROS) Constitutional Symptoms (General Health) Denies complaints or symptoms of Fatigue, Fever, Chills, Marked Weight Change. Eyes Denies complaints or symptoms of Dry Eyes, Vision Changes, Glasses / Contacts. Ear/Nose/Mouth/Throat Complains or has symptoms of Chronic sinus problems or rhinitis. Respiratory Denies  complaints or symptoms of Chronic or frequent coughs, Shortness of Breath. Cardiovascular Denies complaints or symptoms of Chest pain. Gastrointestinal Denies complaints or symptoms of Frequent diarrhea, Nausea, Vomiting. Endocrine Denies complaints or symptoms of Heat/cold intolerance. Genitourinary Denies complaints or symptoms of Frequent urination. Integumentary (Skin) Complains or has symptoms of Wounds. Musculoskeletal Complains or has symptoms of Muscle Weakness. Denies complaints or symptoms of Muscle Pain. Neurologic Denies complaints or symptoms of Numbness/parasthesias. Psychiatric Denies complaints or symptoms of Claustrophobia, Suicidal. Objective Constitutional Sitting or standing Blood Pressure is within target range for patient.. Pulse regular and within target range for patient.Marland Kitchen Respirations regular, non-labored and within target range.. Temperature is normal and within the target range for the patient.Marland Kitchen Appears in no distress. Vitals Time Taken: 10:35 AM, Height: 72 in, Source: Stated, Weight: 256 lbs, Source: Stated, BMI: 34.7, Temperature: 98.1 F, Pulse: 62 bpm, Respiratory Rate: 17 breaths/min, Blood Pressure: 118/38 mmHg, Capillary Blood Glucose: 115 mg/dl. Respiratory work of breathing is normal. Cardiovascular His dorsalis pedis and posterior tibial pulses are reduced but palpable. Psychiatric appears  at normal baseline. General Notes: Wound exam oo Small wounds stage II pressure ulcers on the lower coccyx and left buttock. Some debris on the surface but I do elected not to debride this. oo The more concerning area is on the right lateral fifth metatarsal head covered and completely nonviable surface. This is somewhat boggy. There is also some erythema now on the dorsal lateral foot which I do not think was there previously by pictures on his wife's phone. Integumentary (Hair, Skin) Wound #1 status is Open. Original cause of wound was Gradually Appeared.  The wound is located on the Right,Lateral Foot. The wound measures 2.1cm length x 3cm width x 0.1cm depth; 4.948cm^2 area and 0.495cm^3 volume. There is no tunneling or undermining noted. There is a medium amount of serosanguineous drainage noted. The wound margin is distinct with the outline attached to the wound base. There is small (1-33%) red, pink granulation within the wound bed. There is a large (67-100%) amount of necrotic tissue within the wound bed including Eschar and Adherent Slough. Wound #2 status is Open. Original cause of wound was Gradually Appeared. The wound is located on the Coccyx. The wound measures 1.2cm length x 0.5cm width x 0.2cm depth; 0.471cm^2 area and 0.094cm^3 volume. There is Fat Layer (Subcutaneous Tissue) exposed. There is no tunneling or undermining noted. There is a medium amount of serosanguineous drainage noted. The wound margin is distinct with the outline attached to the wound base. There is medium (34- 66%) red, pink granulation within the wound bed. There is a medium (34-66%) amount of necrotic tissue within the wound bed including Adherent Slough. Wound #3 status is Open. Original cause of wound was Gradually Appeared. The wound is located on the Left Gluteus. The wound measures 0.5cm length x 1cm width x 0.2cm depth; 0.393cm^2 area and 0.079cm^3 volume. There is no tunneling or undermining noted. There is a medium amount of serosanguineous drainage noted. The wound margin is distinct with the outline attached to the wound base. There is medium (34-66%) red, pink granulation within the wound bed. There is a medium (34-66%) amount of necrotic tissue within the wound bed including Adherent Slough. Assessment Active Problems ICD-10 Type 2 diabetes mellitus with foot ulcer Type 2 diabetes mellitus with diabetic peripheral angiopathy without gangrene Non-pressure chronic ulcer of other part of right foot with other specified severity Pressure ulcer of sacral  region, stage 2 Pressure ulcer of left buttock, stage 2 Methicillin susceptible Staphylococcus aureus infection, unspecified site Procedures Wound #1 Pre-procedure diagnosis of Wound #1 is a Diabetic Wound/Ulcer of the Lower Extremity located on the Right,Lateral Foot .Severity of Tissue Pre Debridement is: Fat layer exposed. There was a Chemical/Enzymatic/Mechanical debridement performed by Deon Pilling, RN.Marland Kitchen Agent used was [employer]. There was no bleeding. The procedure was tolerated well. Post Debridement Measurements: 2.1cm length x 3cm width x 0.1cm depth; 0.495cm^3 volume. Character of Wound/Ulcer Post Debridement requires further debridement. Severity of Tissue Post Debridement is: Fat layer exposed. Post procedure Diagnosis Wound #1: Same as Pre-Procedure Plan Follow-up Appointments: Return Appointment in 1 week. Bathing/ Shower/ Hygiene: May shower and wash wound with soap and water. - on days when dressings are changed Edema Control - Lymphedema / SCD / Other: Elevate legs to the level of the heart or above for 30 minutes daily and/or when sitting, a frequency of: Avoid standing for long periods of time. Exercise regularly Moisturize legs daily. Off-Loading: Turn and reposition every 2 hours - avoid slouching in chair when sitting, weight should  be on back of thighs Home Health: New wound care orders this week; continue Home Health for wound care. May utilize formulary equivalent dressing for wound treatment orders unless otherwise specified. Other Home Health Orders/Instructions: - [facility] WOUND #1: - Foot Wound Laterality: Right, Lateral Cleanser: Soap and Water 1 x Per Day/30 Days Discharge Instructions: May shower and wash wound with dial antibacterial soap and water prior to dressing change. Prim Dressing: Santyl Ointment 1 x Per Day/30 Days ary Discharge Instructions: Apply nickel thick amount to wound bed as instructed Secondary Dressing: Woven Gauze Sponge, Non-Sterile  4x4 in 1 x Per Day/30 Days Discharge Instructions: Apply over primary dressing as directed. Secured With: Child psychotherapist, Sterile 2x75 (in/in) 1 x Per Day/30 Days Discharge Instructions: Secure with stretch gauze as directed. WOUND #2: - Coccyx Wound Laterality: Prim Dressing: FIBRACOL Plus Dressing, 2x2 in (collagen) Every Other Day/30 Days ary Discharge Instructions: Moisten collagen with saline or hydrogel Secondary Dressing: ComfortFoam Border, 4x4 in (silicone border) [facility]) Every Other Day/30 Days Discharge Instructions: Apply over primary dressing as directed. WOUND #3: - Gluteus Wound Laterality: Left Prim Dressing: FIBRACOL Plus Dressing, 2x2 in (collagen) (Home Health) Every Other Day/30 Days ary Discharge Instructions: Moisten collagen with saline or hydrogel Secondary Dressing: ComfortFoam Border, 4x4 in (silicone border) [facility]) Every Other Day/30 Days Discharge Instructions: Apply over primary dressing as directed. 1. We will use collagen on the lower sacrum and buttock wounds. These do not look complicated. He is already offloading these as best he can and we discussed this. 2. The more worrisome thing is the area on the right lateral fifth met head. This is a completely nonviable surface. I am also concerned about the degree of erythema in the right forefoot which did not seem to be present a week or 2 ago on his wife's phone pictures. 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 I have marked the area of erythema on the dorsal foot if this expands he is likely going to require additional/broader antibiotic coverage 3. CT scan done in the hospital did not show osteomyelitis or septic arthritis. No abscess. He is still not a candidate for an MRI because of hardware in his neck even though he has no further pacemaker. Surgery done by Dr. Sherwood Gambler remotely 4. He follows up with infectious disease next week I still find  the right lateral foot area concerning. He is going to require debridement in this area but I wanted to see follow-up arterial studies before I go into this. I am going to use Santyl to try and loosen up the surface which will make ultimate mechanical debridement easier. I spent 45 minutes in review of this patient's past medical history, face-to-face evaluation and preparation of this record Electronic Signature(s) Signed: 12/25/2020 4:10:40 PM By: Linton Ham MD Entered By: Linton Ham on 12/25/2020 12:39:35 -------------------------------------------------------------------------------- HxROS Details Patient Name: Date of Service: Shane [name], Shane [name] MES H. 12/25/2020 10:30 A M Medical Record Number: [id number] Patient Account Number: [id number] Date of Birth/Sex: Treating RN: 1941/11/10 (80 y.o. [name], [name] Primary Care Provider: Geoffery Lyons Other Clinician: Referring Provider: Treating Provider/Extender: Peter Garter, Constance Goltz in Treatment: 0 Information Obtained From Patient Constitutional Symptoms (General Health) Complaints and Symptoms: Negative for: Fatigue; Fever; Chills; Marked Weight Change Eyes Complaints and Symptoms: Negative for: Dry Eyes; Vision Changes; Glasses / Contacts Medical History: Negative for: Cataracts; Glaucoma; Optic Neuritis Ear/Nose/Mouth/Throat Complaints and Symptoms: Positive for: Chronic sinus  problems or rhinitis Medical History: Negative for: Chronic sinus problems/congestion; Middle ear problems Respiratory Complaints and Symptoms: Negative for: Chronic or frequent coughs; Shortness of Breath Medical History: Positive for: Sleep Apnea Negative for: Aspiration; Asthma; Chronic Obstructive Pulmonary Disease (COPD); Pneumothorax; Tuberculosis Past Medical History Notes: CPAP Cardiovascular Complaints and Symptoms: Negative for: Chest pain Medical History: Positive for: Arrhythmia; Coronary Artery  Disease; Hypertension; Peripheral Arterial Disease Negative for: Angina; Congestive Heart Failure; Deep Vein Thrombosis; Hypotension; Myocardial Infarction; Peripheral Venous Disease; Phlebitis; Vasculitis Past Medical History Notes: 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, 2021 Gastrointestinal Complaints and Symptoms: Negative for: Frequent diarrhea; Nausea; Vomiting Medical History: Negative for: Cirrhosis ; Colitis; Crohns; Hepatitis A; Hepatitis B; Hepatitis C Endocrine Complaints and Symptoms: Negative for: Heat/cold intolerance Medical History: Positive for: Type II Diabetes Negative for: Type I Diabetes Time with diabetes: 35 years Treated with: Insulin, Oral agents Blood sugar tested every day: Yes Tested : Genitourinary Complaints and Symptoms: Negative for: Frequent urination Medical History: Negative for: End Stage Renal Disease Past Medical History Notes: CKD Stage 3 Integumentary (Skin) Complaints and Symptoms: Positive for: Wounds Medical History: Negative for: History of Burn Musculoskeletal Complaints and Symptoms: Positive for: Muscle Weakness Negative for: Muscle Pain Medical History: Negative for: Gout; Rheumatoid Arthritis; Osteoarthritis; Osteomyelitis Neurologic Complaints and Symptoms: Negative for: Numbness/parasthesias Medical History: Positive for: Neuropathy Negative for: Dementia; Quadriplegia; Paraplegia; Seizure Disorder Psychiatric Complaints and Symptoms: Negative for: Claustrophobia; Suicidal Medical History: Negative for: Anorexia/bulimia; Confinement Anxiety Hematologic/Lymphatic Medical History: Negative for: Anemia; Hemophilia; Human Immunodeficiency Virus; Lymphedema; Sickle Cell Disease Immunological Medical History: Negative for: Lupus Erythematosus; Raynauds; Scleroderma Oncologic Medical History: Negative for: Received Chemotherapy; Received Radiation Immunizations Pneumococcal Vaccine: Received Pneumococcal Vaccination:  Yes Implantable Devices None Hospitalization / Surgery History Type of Hospitalization/Surgery neck and back surgeries CABG 11/2020 stents 12/2020 PACEMAKER removal 12/2020 Family and Social History Cancer: No; Diabetes: Yes - Siblings; Heart Disease: Yes - Father; Hereditary Spherocytosis: No; Hypertension: No; Kidney Disease: No; Lung Disease: Yes - Father; Seizures: No; Stroke: No; Thyroid Problems: No; Tuberculosis: No; Never smoker; Marital Status - Married; Alcohol Use: Never; Drug Use: No History; Caffeine Use: Rarely; Financial Concerns: No; Food, Clothing or Shelter Needs: No; Support System Lacking: No; Transportation Concerns: No Engineer, maintenance) Signed: 12/25/2020 4:10:40 PM By: Linton Ham MD Signed: 12/30/2020 5:56:51 PM By: Rhae Hammock RN Entered By: Rhae Hammock on 12/25/2020 11:03:49 -------------------------------------------------------------------------------- SuperBill Details Patient Name: Date of Service: Shane [name], Shane [name] MES H. 12/25/2020 Medical Record Number: [id number] Patient Account Number: [id number] Date of Birth/Sex: Treating RN: Oct 18, 1941 (80 y.o. Shane [name] Primary Care Provider: Geoffery Lyons Other Clinician: Referring Provider: Treating Provider/Extender: Peter Garter, Constance Goltz in Treatment: 0 Diagnosis Coding ICD-10 Codes Code Description [phone] Type 2 diabetes mellitus with foot ulcer E11.51 Type 2 diabetes mellitus with diabetic peripheral angiopathy without gangrene L97.518 Non-pressure chronic ulcer of other part of right foot with other specified severity L89.152 Pressure ulcer of sacral region, stage 2 L89.322 Pressure ulcer of left buttock, stage 2 A49.01 Methicillin susceptible Staphylococcus aureus infection, unspecified site Facility Procedures CPT4 Code: [id number] Description: [phone] - DEBRIDE W/O ANES NON SELECT Modifier: Quantity: 1 Physician Procedures : CPT4 Code Description  Modifier [id number] - WC PHYS LEVEL 4 - NEW PT ICD-10 Diagnosis Description E11.621 Type 2 diabetes mellitus with foot ulcer L97.518 Non-pressure chronic ulcer of other part of right foot with other specified severity L89.152  Pressure ulcer of sacral region, stage 2 L89.322 Pressure ulcer of left buttock, stage  2 Quantity: 1 Electronic Signature(s) Signed: 12/25/2020 4:10:40 PM By: Linton Ham MD Entered By: Linton Ham on 12/25/2020 12:40:05

## 2020-12-30 NOTE — Progress Notes (Signed)
Shane, [name] ([id number]) Visit Report for 12/25/2020 Allergy List Details Patient Name: Date of Service: Shane [name] MES H. 12/25/2020 10:30 A M Medical Record Number: [id number] Patient Account Number: [id number] Date of Birth/Sex: Treating RN: 12-20-1940 (80 y.o. Shane [name], Shane Primary Care Provider: Geoffery Lyons Other Clinician: Referring Provider: Treating Provider/Extender: Peter Garter, Nanine Means Weeks in Treatment: 0 Allergies Active Allergies codeine Severity: Mild Type: Food Allergy Notes Electronic Signature(s) Signed: 12/30/2020 5:56:51 PM By: Rhae Hammock RN Entered By: Rhae Hammock on 12/25/2020 10:38:09 -------------------------------------------------------------------------------- Arrival Information Details Patient Name: Date of Service: Shane [name], Shane [name] MES H. 12/25/2020 10:30 A M Medical Record Number: [id number] Patient Account Number: [id number] Date of Birth/Sex: Treating RN: 16-Aug-1941 (80 y.o. Shane [name], Shane Primary Care Provider: Geoffery Lyons Other Clinician: Referring Provider: Treating Provider/Extender: Peter Garter, Constance Goltz in Treatment: 0 Visit Information Patient Arrived: Wheel Chair Arrival Time: 10:39 Accompanied By: family Transfer Assistance: None Patient Identification Verified: Yes Secondary Verification Process Completed: Yes Patient Requires Transmission-Based Precautions: No Patient Has Alerts: No Electronic Signature(s) Signed: 12/30/2020 5:56:51 PM By: Rhae Hammock RN Entered By: Rhae Hammock on 12/25/2020 10:39:52 -------------------------------------------------------------------------------- Clinic Level of Care Assessment Details Patient Name: Date of Service: Shane [name] MES H. 12/25/2020 10:30 A M Medical Record Number: [id number] Patient Account Number: [id number] Date of Birth/Sex: Treating RN: 1941-07-13 (80 y.o. Shane [name] Primary Care Provider:  Geoffery Lyons Other Clinician: Referring Provider: Treating Provider/Extender: Peter Garter, Constance Goltz in Treatment: 0 Clinic Level of Care Assessment Items TOOL 1 Quantity Score []  - 0 Use when EandM and Procedure is performed on INITIAL visit ASSESSMENTS - Nursing Assessment / Reassessment X- 1 20 General Physical Exam (combine w/ comprehensive assessment (listed just below) when performed on new pt. evals) X- 1 25 Comprehensive Assessment (HX, ROS, Risk Assessments, Wounds Hx, etc.) ASSESSMENTS - Wound and Skin Assessment / Reassessment []  - 0 Dermatologic / Skin Assessment (not related to wound area) ASSESSMENTS - Ostomy and/or Continence Assessment and Care []  - 0 Incontinence Assessment and Management []  - 0 Ostomy Care Assessment and Management (repouching, etc.) PROCESS - Coordination of Care X - Simple Patient / Family Education for ongoing care 1 15 []  - 0 Complex (extensive) Patient / Family Education for ongoing care X- 1 10 Staff obtains Programmer, systems, Records, T Results / Process Orders est X- 1 10 Staff telephones HHA, Nursing Homes / Clarify orders / etc []  - 0 Routine Transfer to another Facility (non-emergent condition) []  - 0 Routine Hospital Admission (non-emergent condition) X- 1 15 New Admissions / Biomedical engineer / Ordering NPWT Apligraf, etc. , []  - 0 Emergency Hospital Admission (emergent condition) PROCESS - Special Needs []  - 0 Pediatric / Minor Patient Management []  - 0 Isolation Patient Management []  - 0 Hearing / Language / Visual special needs []  - 0 Assessment of Community assistance (transportation, D/C planning, etc.) []  - 0 Additional assistance / Altered mentation []  - 0 Support Surface(s) Assessment (bed, cushion, seat, etc.) INTERVENTIONS - Miscellaneous []  - 0 External ear exam []  - 0 Patient Transfer (multiple staff / Civil Service fast streamer / Similar devices) []  - 0 Simple Staple / Suture removal (25 or  less) []  - 0 Complex Staple / Suture removal (26 or more) []  - 0 Hypo/Hyperglycemic Management (do not check if billed separately) []  - 0 Ankle / Brachial Index (ABI) - do not check if billed separately Has the patient been seen at the hospital within the  last three years: Yes Total Score: 95 Level Of Care: New/Established - Level 3 Electronic Signature(s) Signed: 12/25/2020 4:31:51 PM By: Baruch Gouty RN, BSN Entered By: Baruch Gouty on 12/25/2020 11:44:24 -------------------------------------------------------------------------------- Encounter Discharge Information Details Patient Name: Date of Service: Shane [name], Shane [name] MES H. 12/25/2020 10:30 A M Medical Record Number: [id number] Patient Account Number: [id number] Date of Birth/Sex: Treating RN: 1941/09/07 (80 y.o. Shane [name], Shane Primary Care Provider: Geoffery Lyons Other Clinician: Referring Provider: Treating Provider/Extender: Peter Garter, Constance Goltz in Treatment: 0 Encounter Discharge Information Items Post Procedure Vitals Discharge Condition: Stable Temperature (F): 98.1 Ambulatory Status: Wheelchair Pulse (bpm): 62 Discharge Destination: Home Respiratory Rate (breaths/min): 17 Transportation: Private Auto Blood Pressure (mmHg): 118/38 Accompanied By: self Schedule Follow-up Appointment: Yes Clinical Summary of Care: Patient Declined Electronic Signature(s) Signed: 12/30/2020 5:56:51 PM By: Rhae Hammock RN Entered By: Rhae Hammock on 12/25/2020 12:12:45 -------------------------------------------------------------------------------- Lower Extremity Assessment Details Patient Name: Date of Service: Shane [name] MES H. 12/25/2020 10:30 A M Medical Record Number: [id number] Patient Account Number: [id number] Date of Birth/Sex: Treating RN: [birth year] (80 y.o. Shane [name], Shane Primary Care Provider: Geoffery Lyons Other Clinician: Referring Provider: Treating  Provider/Extender: Peter Garter, Constance Goltz in Treatment: 0 Edema Assessment Assessed: Shirlyn Goltz: No] Patrice Paradise: Yes] Edema: [Left: Ye] [Right: s] Calf Left: Right: Point of Measurement: 38 cm From Medial Instep 46 cm Ankle Left: Right: Point of Measurement: 12 cm From Medial Instep 28.5 cm Vascular Assessment Pulses: Dorsalis Pedis Palpable: [Right:Yes] Posterior Tibial Palpable: [Right:Yes] Electronic Signature(s) Signed: 12/30/2020 5:56:51 PM By: Rhae Hammock RN Entered By: Rhae Hammock on 12/25/2020 11:08:37 -------------------------------------------------------------------------------- Multi Wound Chart Details Patient Name: Date of Service: Shane [name], Shane [name] MES H. 12/25/2020 10:30 A M Medical Record Number: [id number] Patient Account Number: [id number] Date of Birth/Sex: Treating RN: [birth year] (80 y.o. Shane [name] Primary Care Provider: Geoffery Lyons Other Clinician: Referring Provider: Treating Provider/Extender: Peter Garter, Constance Goltz in Treatment: 0 Vital Signs Height(in): 41 Capillary Blood Glucose(mg/dl): 115 Weight(lbs): 256 Pulse(bpm): 30 Body Mass Index(BMI): 51 Blood Pressure(mmHg): 118/38 Temperature(F): 98.1 Respiratory Rate(breaths/min): 17 Photos: [1:No Photos Right, Lateral Foot] [2:No Photos Coccyx] [3:No Photos Left Gluteus] Wound Location: [1:Gradually Appeared] [2:Gradually Appeared] [3:Gradually Appeared] Wounding Event: [1:Diabetic Wound/Ulcer of the Lower] [2:Pressure Ulcer] [3:Pressure Ulcer] Primary Etiology: [1:Extremity Sleep Apnea, Arrhythmia, Coronary] [2:Sleep Apnea, Arrhythmia, Coronary Sleep Apnea, Arrhythmia, Coronary] Comorbid History: [1:Artery Disease, Hypertension, Peripheral Arterial Disease, Type II Diabetes, Neuropathy 12/08/2020] [2:Artery Disease, Hypertension, Peripheral Arterial Disease, Type II Peripheral Arterial Disease, Type II Diabetes, Neuropathy  11/13/2020] [3:Artery  Disease, Hypertension, Diabetes, Neuropathy 11/13/2020] Date Acquired: [1:0] [2:0] [3:0] Weeks of Treatment: [1:Open] [2:Open] [3:Open] Wound Status: [1:2.1x3x0.1] [2:1.2x0.5x0.2] [3:0.5x1x0.2] Measurements L x W x D (cm) [1:4.948] [2:0.471] [3:0.393] A (cm) : rea [1:0.495] [2:0.094] [3:0.079] Volume (cm) : [1:Grade 2] [2:Category/Stage II] [3:Category/Stage II] Classification: [1:Medium] [2:Medium] [3:Medium] Exudate A mount: [1:Serosanguineous] [2:Serosanguineous] [3:Serosanguineous] Exudate Type: [1:red, brown] [2:red, brown] [3:red, brown] Exudate Color: [1:Distinct, outline attached] [2:Distinct, outline attached] [3:Distinct, outline attached] Wound Margin: [1:Small (1-33%)] [2:Medium (34-66%)] [3:Medium (34-66%)] Granulation A mount: [1:Red, Pink] [2:Red, Pink] [3:Red, Pink] Granulation Quality: [1:Large (67-100%)] [2:Medium (34-66%)] [3:Medium (34-66%)] Necrotic A mount: [1:Eschar, Adherent Slough] [2:Adherent Slough] [3:Adherent Slough] Necrotic Tissue: [1:Fascia: No] [2:Fat Layer (Subcutaneous Tissue): Yes Fascia: No] Exposed Structures: [1:Fat Layer (Subcutaneous Tissue): No Tendon: No Muscle: No Joint: No Bone: No Small (1-33%)] [2:Fascia: No Tendon: No Muscle: No Joint: No Bone: No None] [3:Fat Layer (Subcutaneous Tissue): No Tendon: No Muscle:  No Joint: No Bone: No None] Epithelialization: [1:Chemical/Enzymatic/Mechanical] [2:N/A] [3:N/A] Debridement: [1:N/A] [2:N/A] [3:N/A] Instrument: [1:None] [2:N/A] [3:N/A] Bleeding: Debridement Treatment Response: Procedure was tolerated well [2:N/A] [3:N/A] Post Debridement Measurements L x 2.1x3x0.1 [2:N/A] [3:N/A] W x D (cm) [1:0.495] [2:N/A] [3:N/A] Post Debridement Volume: (cm) [1:Debridement] [2:N/A] [3:N/A] Treatment Notes Electronic Signature(s) Signed: 12/25/2020 4:10:40 PM By: Linton Ham MD Signed: 12/25/2020 4:31:51 PM By: Baruch Gouty RN, BSN Entered By: Linton Ham on 12/25/2020  12:10:13 -------------------------------------------------------------------------------- Multi-Disciplinary Care Plan Details Patient Name: Date of Service: Shane [name], Shane [name] MES H. 12/25/2020 10:30 A M Medical Record Number: [id number] Patient Account Number: [id number] Date of Birth/Sex: Treating RN: [birth year] (79 y.o. Shane [name] Primary Care Provider: Geoffery Lyons Other Clinician: Referring Provider: Treating Provider/Extender: Peter Garter, Constance Goltz in Treatment: 0 Active Inactive Abuse / Safety / Falls / Self Care Management Nursing Diagnoses: Potential for falls Goals: Patient/caregiver will verbalize/demonstrate measures taken to prevent injury and/or falls Date Initiated: 12/25/2020 Target Resolution Date: 01/22/2021 Goal Status: Active Interventions: Assess fall risk on admission and as needed Assess impairment of mobility on admission and as needed per policy Notes: Nutrition Nursing Diagnoses: Impaired glucose control: actual or potential Potential for alteratiion in Nutrition/Potential for imbalanced nutrition Goals: Patient/caregiver will maintain therapeutic glucose control Date Initiated: 12/25/2020 Target Resolution Date: 01/22/2021 Goal Status: Active Interventions: Assess HgA1c results as ordered upon admission and as needed Assess patient nutrition upon admission and as needed per policy Provide education on elevated blood sugars and impact on wound healing Treatment Activities: Patient referred to Primary Care Physician for further nutritional evaluation : 12/25/2020 Notes: Pressure Nursing Diagnoses: Knowledge deficit related to causes and risk factors for pressure ulcer development Knowledge deficit related to management of pressures ulcers Potential for impaired tissue integrity related to pressure, friction, moisture, and shear Goals: Patient/caregiver will verbalize understanding of pressure ulcer management Date  Initiated: 12/25/2020 Target Resolution Date: 01/22/2021 Goal Status: Active Interventions: Assess: immobility, friction, shearing, incontinence upon admission and as needed Assess potential for pressure ulcer upon admission and as needed Provide education on pressure ulcers Notes: Tissue Oxygenation Nursing Diagnoses: Actual ineffective tissue perfusion; peripheral (select once diagnosis is confirmed) Knowledge deficit related to disease process and management Goals: Patient/caregiver will verbalize understanding of disease process and disease management Date Initiated: 12/25/2020 Target Resolution Date: 01/22/2021 Goal Status: Active Interventions: Assess patient understanding of disease process and management upon diagnosis and as needed Assess peripheral arterial status upon admission and as needed Provide education on tissue oxygenation and ischemia Treatment Activities: T ordered outside of clinic : 12/25/2020 est Notes: Wound/Skin Impairment Nursing Diagnoses: Impaired tissue integrity Knowledge deficit related to ulceration/compromised skin integrity Goals: Patient/caregiver will verbalize understanding of skin care regimen Date Initiated: 12/25/2020 Target Resolution Date: 01/22/2021 Goal Status: Active Ulcer/skin breakdown will have a volume reduction of 30% by week 4 Date Initiated: 12/25/2020 Target Resolution Date: 01/22/2021 Goal Status: Active Interventions: Assess patient/caregiver ability to obtain necessary supplies Assess patient/caregiver ability to perform ulcer/skin care regimen upon admission and as needed Assess ulceration(s) every visit Provide education on ulcer and skin care Treatment Activities: Skin care regimen initiated : 12/25/2020 Topical wound management initiated : 12/25/2020 Notes: Electronic Signature(s) Signed: 12/25/2020 4:31:51 PM By: Baruch Gouty RN, BSN Entered By: Baruch Gouty on 12/25/2020  11:39:55 -------------------------------------------------------------------------------- Pain Assessment Details Patient Name: Date of Service: Shane [name], Shane [name] MES H. 12/25/2020 10:30 A M Medical Record Number: [id number] Patient Account Number: [id number] Date of Birth/Sex: Treating RN: October 22, 1941 (80 y.o. Shane [name],  Shane Primary Care Provider: Geoffery Lyons Other Clinician: Referring Provider: Treating Provider/Extender: Peter Garter, Constance Goltz in Treatment: 0 Active Problems Location of Pain Severity and Description of Pain Patient Has Paino Yes Site Locations Pain Location: Pain Location: Pain in Ulcers Duration of the Pain. Constant / Intermittento Intermittent Rate the pain. Current Pain Level: 3 Worst Pain Level: 10 Least Pain Level: 0 Tolerable Pain Level: 7 Character of Pain Describe the Pain: Aching Pain Management and Medication Current Pain Management: Medication: Yes Cold Application: No Rest: Yes Massage: No Activity: No T.E.N.S.: No Heat Application: No Leg drop or elevation: No Is the Current Pain Management Adequate: Adequate How does your wound impact your activities of daily livingo Sleep: No Bathing: No Appetite: No Relationship With Others: No Bladder Continence: No Emotions: No Bowel Continence: No Work: No Toileting: No Drive: No Dressing: No Hobbies: No Electronic Signature(s) Signed: 12/30/2020 5:56:51 PM By: Rhae Hammock RN Entered By: Rhae Hammock on 12/25/2020 10:42:11 -------------------------------------------------------------------------------- Patient/Caregiver Education Details Patient Name: Date of Service: Shane [name] MES H. 1/21/2022andnbsp10:30 A M Medical Record Number: [id number] Patient Account Number: [id number] Date of Birth/Gender: Treating RN: [birth year] (80 y.o. Shane [name] Primary Care Physician: Geoffery Lyons Other Clinician: Referring Physician: Treating  Physician/Extender: Inetta Fermo in Treatment: 0 Education Assessment Education Provided To: Patient Education Topics Provided Elevated Blood Sugar/ Impact on Healing: Handouts: Elevated Blood Sugars: How Do They Affect Wound Healing Methods: Explain/Verbal, Printed Responses: Reinforcements needed, State content correctly Pressure: Handouts: Pressure Ulcers: Care and Offloading, Pressure Ulcers: Care and Offloading 2 Methods: Explain/Verbal, Printed Responses: Reinforcements needed, State content correctly Tissue Oxygenation: Methods: Explain/Verbal Responses: Reinforcements needed, State content correctly Wound/Skin Impairment: Handouts: Caring for Your Ulcer, Skin Care Do's and Dont's Methods: Explain/Verbal, Printed Responses: Return demonstration correctly, State content correctly Electronic Signature(s) Signed: 12/25/2020 4:31:51 PM By: Baruch Gouty RN, BSN Entered By: Baruch Gouty on 12/25/2020 11:42:08 -------------------------------------------------------------------------------- Wound Assessment Details Patient Name: Date of Service: Shane [name], Shane [name] MES H. 12/25/2020 10:30 A M Medical Record Number: [id number] Patient Account Number: [id number] Date of Birth/Sex: Treating RN: 08/07/1941 (80 y.o. Shane [name], Shane Primary Care Provider: Geoffery Lyons Other Clinician: Referring Provider: Treating Provider/Extender: Peter Garter, Constance Goltz in Treatment: 0 Wound Status Wound Number: 1 Primary Diabetic Wound/Ulcer of the Lower Extremity Etiology: Wound Location: Right, Lateral Foot Wound Open Wounding Event: Gradually Appeared Status: Date Acquired: 12/08/2020 Comorbid Sleep Apnea, Arrhythmia, Coronary Artery Disease, Hypertension, Weeks Of Treatment: 0 History: Peripheral Arterial Disease, Type II Diabetes, Neuropathy Clustered Wound: No Wound Measurements Length: (cm) 2.1 Width: (cm) 3 Depth: (cm)  0.1 Area: (cm) 4.948 Volume: (cm) 0.495 % Reduction in Area: % Reduction in Volume: Epithelialization: Small (1-33%) Tunneling: No Undermining: No Wound Description Classification: Grade 2 Wound Margin: Distinct, outline attached Exudate Amount: Medium Exudate Type: Serosanguineous Exudate Color: red, brown Foul Odor After Cleansing: No Slough/Fibrino Yes Wound Bed Granulation Amount: Small (1-33%) Exposed Structure Granulation Quality: Red, Pink Fascia Exposed: No Necrotic Amount: Large (67-100%) Fat Layer (Subcutaneous Tissue) Exposed: No Necrotic Quality: Eschar, Adherent Slough Tendon Exposed: No Muscle Exposed: No Joint Exposed: No Bone Exposed: No Treatment Notes Wound #1 (Foot) Wound Laterality: Right, Lateral Cleanser Soap and Water Discharge Instruction: May shower and wash wound with dial antibacterial soap and water prior to dressing change. Peri-Wound Care Topical Primary Dressing Santyl Ointment Discharge Instruction: Apply nickel thick amount to wound bed as instructed Secondary Dressing Woven Gauze Sponge, Non-Sterile 4x4 in  Discharge Instruction: Apply over primary dressing as directed. Secured With Conforming Stretch Gauze Bandage, Sterile 2x75 (in/in) Discharge Instruction: Secure with stretch gauze as directed. Compression Wrap Compression Stockings Add-Ons Electronic Signature(s) Signed: 12/30/2020 5:56:51 PM By: Rhae Hammock RN Entered By: Rhae Hammock on 12/25/2020 11:11:58 -------------------------------------------------------------------------------- Wound Assessment Details Patient Name: Date of Service: Shane [name], Shane [name] MES H. 12/25/2020 10:30 A M Medical Record Number: [id number] Patient Account Number: [id number] Date of Birth/Sex: Treating RN: December 03, 1941 (80 y.o. Shane [name], Shane Primary Care Provider: Geoffery Lyons Other Clinician: Referring Provider: Treating Provider/Extender: Peter Garter, Constance Goltz in Treatment: 0 Wound Status Wound Number: 2 Primary Pressure Ulcer Etiology: Wound Location: Coccyx Wound Open Wounding Event: Gradually Appeared Status: Date Acquired: 11/13/2020 Comorbid Sleep Apnea, Arrhythmia, Coronary Artery Disease, Hypertension, Weeks Of Treatment: 0 History: Peripheral Arterial Disease, Type II Diabetes, Neuropathy Clustered Wound: No Wound Measurements Length: (cm) 1.2 Width: (cm) 0.5 Depth: (cm) 0.2 Area: (cm) 0.471 Volume: (cm) 0.094 % Reduction in Area: % Reduction in Volume: Epithelialization: None Tunneling: No Undermining: No Wound Description Classification: Category/Stage II Wound Margin: Distinct, outline attached Exudate Amount: Medium Exudate Type: Serosanguineous Exudate Color: red, brown Foul Odor After Cleansing: No Slough/Fibrino Yes Wound Bed Granulation Amount: Medium (34-66%) Exposed Structure Granulation Quality: Red, Pink Fascia Exposed: No Necrotic Amount: Medium (34-66%) Fat Layer (Subcutaneous Tissue) Exposed: Yes Necrotic Quality: Adherent Slough Tendon Exposed: No Muscle Exposed: No Joint Exposed: No Bone Exposed: No Treatment Notes Wound #2 (Coccyx) Cleanser Peri-Wound Care Topical Primary Dressing FIBRACOL Plus Dressing, 2x2 in (collagen) Discharge Instruction: Moisten collagen with saline or hydrogel Secondary Dressing ComfortFoam Border, 4x4 in (silicone border) Discharge Instruction: Apply over primary dressing as directed. Secured With Compression Wrap Compression Stockings Environmental education officer) Signed: 12/30/2020 5:56:51 PM By: Rhae Hammock RN Entered By: Rhae Hammock on 12/25/2020 11:18:06 -------------------------------------------------------------------------------- Wound Assessment Details Patient Name: Date of Service: Shane [name], Shane [name] MES H. 12/25/2020 10:30 A M Medical Record Number: [id number] Patient Account Number: [id number] Date of Birth/Sex: Treating  RN: [birth year] (80 y.o. Shane [name], Shane Primary Care Provider: Geoffery Lyons Other Clinician: Referring Provider: Treating Provider/Extender: Peter Garter, Constance Goltz in Treatment: 0 Wound Status Wound Number: 3 Primary Pressure Ulcer Etiology: Wound Location: Left Gluteus Wound Open Wounding Event: Gradually Appeared Status: Date Acquired: 11/13/2020 Comorbid Sleep Apnea, Arrhythmia, Coronary Artery Disease, Hypertension, Weeks Of Treatment: 0 History: Peripheral Arterial Disease, Type II Diabetes, Neuropathy Clustered Wound: No Wound Measurements Length: (cm) 0.5 Width: (cm) 1 Depth: (cm) 0.2 Area: (cm) 0.393 Volume: (cm) 0.079 % Reduction in Area: % Reduction in Volume: Epithelialization: None Tunneling: No Undermining: No Wound Description Classification: Category/Stage II Wound Margin: Distinct, outline attached Exudate Amount: Medium Exudate Type: Serosanguineous Exudate Color: red, brown Foul Odor After Cleansing: No Slough/Fibrino Yes Wound Bed Granulation Amount: Medium (34-66%) Exposed Structure Granulation Quality: Red, Pink Fascia Exposed: No Necrotic Amount: Medium (34-66%) Fat Layer (Subcutaneous Tissue) Exposed: No Necrotic Quality: Adherent Slough Tendon Exposed: No Muscle Exposed: No Joint Exposed: No Bone Exposed: No Treatment Notes Wound #3 (Gluteus) Wound Laterality: Left Cleanser Peri-Wound Care Topical Primary Dressing FIBRACOL Plus Dressing, 2x2 in (collagen) Discharge Instruction: Moisten collagen with saline or hydrogel Secondary Dressing ComfortFoam Border, 4x4 in (silicone border) Discharge Instruction: Apply over primary dressing as directed. Secured With Compression Wrap Compression Stockings Environmental education officer) Signed: 12/30/2020 5:56:51 PM By: Rhae Hammock RN Entered By: Rhae Hammock on 12/25/2020  11:19:09 -------------------------------------------------------------------------------- Vitals Details Patient Name: Date of Service: Shane [name], Shane [name] MES  H. 12/25/2020 10:30 A M Medical Record Number: [id number] Patient Account Number: [id number] Date of Birth/Sex: Treating RN: 16-Jan-1941 (80 y.o. Shane [name], Shane Primary Care Provider: Geoffery Lyons Other Clinician: Referring Provider: Treating Provider/Extender: Peter Garter, Constance Goltz in Treatment: 0 Vital Signs Time Taken: 10:35 Temperature (F): 98.1 Height (in): 72 Pulse (bpm): 62 Source: Stated Respiratory Rate (breaths/min): 17 Weight (lbs): 256 Blood Pressure (mmHg): 118/38 Source: Stated Capillary Blood Glucose (mg/dl): 115 Body Mass Index (BMI): 34.7 Reference Range: 80 - 120 mg / dl Electronic Signature(s) Signed: 12/30/2020 5:56:51 PM By: Rhae Hammock RN Entered By: Rhae Hammock on 12/25/2020 10:39:58

## 2020-12-31 ENCOUNTER — Ambulatory Visit: Payer: Medicare Other | Admitting: Cardiology

## 2020-12-31 ENCOUNTER — Ambulatory Visit (INDEPENDENT_AMBULATORY_CARE_PROVIDER_SITE_OTHER): Payer: Medicare Other | Admitting: Emergency Medicine

## 2020-12-31 ENCOUNTER — Telehealth: Payer: Self-pay | Admitting: Cardiology

## 2020-12-31 ENCOUNTER — Other Ambulatory Visit: Payer: Self-pay

## 2020-12-31 DIAGNOSIS — I459 Conduction disorder, unspecified: Secondary | ICD-10-CM

## 2020-12-31 NOTE — Patient Instructions (Signed)
Please call if you see any swelling, redness, drainage or have fever or chills.   [facility] [phone]

## 2020-12-31 NOTE — Telephone Encounter (Signed)
Just seeing this message secondary to it being routed directly to [facility] who is out of the office This afternoon left arm started feeling heavy, still feeling a little heavy. Did have complete loss of use of it that lasted about 30 minutes.   Has been sleeping since about 3 pm this afternoon.  Legs feel weak  When he started to get up from bed he felt dizzy, had difficulty standing. He was able to walk to his chair but when he got to it he was weak/dizzy fell into his chair and started shaking.  Spoke with patient and he stated the dizziness is not with movement.  Denies chest pain or shortness of breath  Blood pressure was in the 150's once today but does not usually run that high.  Apologized to patient that no one reached out to him sooner  When talking with patient discussing the loss of use with his arm him calling 911 in those situations he did say the had thought about calling them a couple times today.  Advised patient he should call 911 and go to ED for evaluation.  Will forward to Dr Stanford Breed for review

## 2020-12-31 NOTE — Telephone Encounter (Signed)
New Message:     Pt says he is having weakness in his left arm, can hardly lift it, also dizziness.  STAT if patient feels like he/she is going to faint   1) Are you dizzy now?  yes  2) Do you feel faint or have you passed out? no  3) Do you have any other symptoms? Left arm heavy and weak  4) Have you checked your HR and BP (record if available)? 132/62

## 2020-12-31 NOTE — Progress Notes (Signed)
Patient seen in device clinic for wound recheck. Steri-strips removed. No redness, swelling, drainage, fever or chills. Edges are fully approximated. Wound care education provided. Patient advised to call with further questions or concerns.

## 2021-01-01 ENCOUNTER — Encounter (HOSPITAL_BASED_OUTPATIENT_CLINIC_OR_DEPARTMENT_OTHER): Payer: Medicare Other | Admitting: Internal Medicine

## 2021-01-01 ENCOUNTER — Other Ambulatory Visit (HOSPITAL_COMMUNITY)
Admission: RE | Admit: 2021-01-01 | Discharge: 2021-01-01 | Disposition: A | Discharge status: Home / Self Care | Payer: Medicare Other | Source: Other Acute Inpatient Hospital | Attending: Internal Medicine | Admitting: Internal Medicine

## 2021-01-01 ENCOUNTER — Telehealth: Payer: Self-pay | Admitting: Internal Medicine

## 2021-01-01 DIAGNOSIS — L089 Local infection of the skin and subcutaneous tissue, unspecified: Secondary | ICD-10-CM | POA: Diagnosis present

## 2021-01-01 DIAGNOSIS — E11621 Type 2 diabetes mellitus with foot ulcer: Secondary | ICD-10-CM | POA: Diagnosis not present

## 2021-01-01 NOTE — Telephone Encounter (Signed)
Agree with ED eval Kirk Ruths

## 2021-01-01 NOTE — Telephone Encounter (Signed)
Spoke with pt wife, she reports the patient did not go to the ER. They are currently at the wound center and plan on keeping that appointment. She reports the patient is still not feeling well today. Aware dr Stanford Breed agreed with the er visit. She reports they are going to wait and see.

## 2021-01-01 NOTE — Progress Notes (Signed)
Shane, [name] ([id number]) Visit Report for 01/01/2021 Debridement Details Patient Name: Date of Service: Shane [name] MES H. 01/01/2021 11:00 A M Medical Record Number: [id number] Patient Account Number: [id number] Date of Birth/Sex: Treating RN: 1941/02/13 (80 y.o. [name] Primary Care Provider: Geoffery Lyons Other Clinician: Referring Provider: Treating Provider/Extender: Peter Garter, Constance Goltz in Treatment: 1 Debridement Performed for Assessment: Wound #1 Right,Lateral Foot Performed By: Physician Ricard Dillon., MD Debridement Type: Debridement Severity of Tissue Pre Debridement: Fat layer exposed Level of Consciousness (Pre-procedure): Awake and Alert Pre-procedure Verification/Time Out Yes - 11:55 Taken: Start Time: 11:57 Pain Control: Other : Benzocaine 20% spray T Area Debrided (L x W): otal 2.2 (cm) x 3 (cm) = 6.6 (cm) Tissue and other material debrided: Viable, Non-Viable, Fat, Slough, Subcutaneous, Slough Level: Skin/Subcutaneous Tissue Debridement Description: Excisional Instrument: Blade, Curette, Forceps Specimen: Swab, Number of Specimens T aken: 1 Bleeding: Minimum Hemostasis Achieved: Pressure End Time: 12:02 Procedural Pain: 0 Post Procedural Pain: 0 Response to Treatment: Procedure was tolerated well Level of Consciousness (Post- Awake and Alert procedure): Post Debridement Measurements of Total Wound Length: (cm) 2.2 Width: (cm) 3 Depth: (cm) 1.5 Volume: (cm) 7.775 Character of Wound/Ulcer Post Debridement: Requires Further Debridement Severity of Tissue Post Debridement: Fat layer exposed Post Procedure Diagnosis Same as Pre-procedure Electronic Signature(s) Signed: 01/01/2021 5:33:44 PM By: Linton Ham MD Signed: 01/01/2021 5:52:16 PM By: Baruch Gouty RN, BSN Entered By: Linton Ham on 01/01/2021 12:51:40 -------------------------------------------------------------------------------- Debridement  Details Patient Name: Date of Service: Shane [name], Shane [name] MES H. 01/01/2021 11:00 A M Medical Record Number: [id number] Patient Account Number: [id number] Date of Birth/Sex: Treating RN: 04-Jan-1941 (80 y.o. [name] Primary Care Provider: Geoffery Lyons Other Clinician: Referring Provider: Treating Provider/Extender: Peter Garter, Constance Goltz in Treatment: 1 Debridement Performed for Assessment: Wound #2 Coccyx Performed By: Physician Ricard Dillon., MD Debridement Type: Debridement Level of Consciousness (Pre-procedure): Awake and Alert Pre-procedure Verification/Time Out Yes - 11:55 Taken: Start Time: 12:02 Pain Control: Other : Benzocaine 20% spray T Area Debrided (L x W): otal 1.1 (cm) x 0.3 (cm) = 0.33 (cm) Tissue and other material debrided: Viable, Non-Viable, Slough, Subcutaneous, Slough Level: Skin/Subcutaneous Tissue Debridement Description: Excisional Instrument: Blade, Curette, Forceps Specimen: Swab, Number of Specimens T aken: 1 Bleeding: Minimum Hemostasis Achieved: Pressure End Time: 12:02 Procedural Pain: 0 Post Procedural Pain: 0 Response to Treatment: Procedure was tolerated well Level of Consciousness (Post- Awake and Alert procedure): Post Debridement Measurements of Total Wound Length: (cm) 1.1 Stage: Category/Stage II Width: (cm) 0.3 Depth: (cm) 0.1 Volume: (cm) 0.026 Character of Wound/Ulcer Post Debridement: Requires Further Debridement Post Procedure Diagnosis Same as Pre-procedure Electronic Signature(s) Signed: 01/01/2021 5:33:44 PM By: Linton Ham MD Signed: 01/01/2021 5:52:16 PM By: Baruch Gouty RN, BSN Entered By: Linton Ham on 01/01/2021 12:51:52 -------------------------------------------------------------------------------- Debridement Details Patient Name: Date of Service: Shane [name], Shane [name] MES H. 01/01/2021 11:00 A M Medical Record Number: [id number] Patient Account Number: [id number] Date of  Birth/Sex: Treating RN: 1941-11-26 (80 y.o. [name] Primary Care Provider: Geoffery Lyons Other Clinician: Referring Provider: Treating Provider/Extender: Peter Garter, Constance Goltz in Treatment: 1 Debridement Performed for Assessment: Wound #3 Left Gluteus Performed By: Physician Ricard Dillon., MD Debridement Type: Debridement Level of Consciousness (Pre-procedure): Awake and Alert Pre-procedure Verification/Time Out Yes - 11:55 Taken: Start Time: 12:02 Pain Control: Other : Benzocaine 20% spray T Area Debrided (L x W): otal 0.3 (cm) x 1.6 (cm) =  0.48 (cm) Tissue and other material debrided: Viable, Non-Viable, Fat, Slough, Subcutaneous, Slough Level: Skin/Subcutaneous Tissue Debridement Description: Excisional Instrument: Blade, Curette, Forceps Specimen: Swab, Number of Specimens T aken: 1 Bleeding: Minimum Hemostasis Achieved: Pressure End Time: 12:04 Procedural Pain: 0 Post Procedural Pain: 0 Response to Treatment: Procedure was tolerated well Level of Consciousness (Post- Awake and Alert procedure): Post Debridement Measurements of Total Wound Length: (cm) 0.3 Stage: Category/Stage II Width: (cm) 1.6 Depth: (cm) 0.1 Volume: (cm) 0.038 Character of Wound/Ulcer Post Debridement: Requires Further Debridement Post Procedure Diagnosis Same as Pre-procedure Electronic Signature(s) Signed: 01/01/2021 5:33:44 PM By: Linton Ham MD Signed: 01/01/2021 5:52:16 PM By: Baruch Gouty RN, BSN Entered By: Linton Ham on 01/01/2021 12:52:02 -------------------------------------------------------------------------------- Debridement Details Patient Name: Date of Service: Shane [name], Shane [name] MES H. 01/01/2021 11:00 A M Medical Record Number: [id number] Patient Account Number: [id number] Date of Birth/Sex: Treating RN: [birth year] (80 y.o. [name] Primary Care Provider: Geoffery Lyons Other Clinician: Referring  Provider: Treating Provider/Extender: Peter Garter, Constance Goltz in Treatment: 1 Debridement Performed for Assessment: Wound #4 Left,Medial Lower Leg Performed By: Physician Ricard Dillon., MD Debridement Type: Debridement Level of Consciousness (Pre-procedure): Awake and Alert Pre-procedure Verification/Time Out Yes - 11:55 Taken: Start Time: 12:02 Pain Control: Other : Benzocaine 20% spray T Area Debrided (L x W): otal 1.1 (cm) x 0.5 (cm) = 0.55 (cm) Tissue and other material debrided: Viable, Non-Viable, Fat, Slough, Subcutaneous, Slough Level: Skin/Subcutaneous Tissue Debridement Description: Excisional Instrument: Blade, Curette, Forceps Specimen: Swab, Number of Specimens T aken: 1 Bleeding: Minimum Hemostasis Achieved: Pressure End Time: 12:06 Procedural Pain: 0 Post Procedural Pain: 0 Response to Treatment: Procedure was tolerated well Level of Consciousness (Post- Awake and Alert procedure): Post Debridement Measurements of Total Wound Length: (cm) 1.1 Width: (cm) 0.5 Depth: (cm) 0.1 Volume: (cm) 0.043 Character of Wound/Ulcer Post Debridement: Requires Further Debridement Post Procedure Diagnosis Same as Pre-procedure Electronic Signature(s) Signed: 01/01/2021 5:33:44 PM By: Linton Ham MD Signed: 01/01/2021 5:52:16 PM By: Baruch Gouty RN, BSN Entered By: Linton Ham on 01/01/2021 12:52:12 -------------------------------------------------------------------------------- HPI Details Patient Name: Date of Service: Shane [name], Shane [name] MES H. 01/01/2021 11:00 A M Medical Record Number: [id number] Patient Account Number: [id number] Date of Birth/Sex: Treating RN: [birth year] (80 y.o. [name] Primary Care Provider: Geoffery Lyons Other Clinician: Referring Provider: Treating Provider/Extender: Peter Garter, Constance Goltz in Treatment: 1 History of Present Illness HPI Description: ADMISSION 12/25/2020 This is a  80 year old man who had an elective admission to [place] in early December for coronary artery disease he underwent a CABG x3. He spent in the morning amount of time in the hospital 15 days according to his wife in 10 days in the ICU but I have not researched this issue. When he left [facility] he apparently had the pressure ulcers on the coccyx and the gluteal area. He was at home for a short period of time however he required readmission to [facility] from 12/08/2020 through 12/16/2020 with increasing fatigue dyspnea fever and cough. 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 The patient is currently on Ancef. During the hospitalization he was discovered to have a low ABI of 0.62 on the right. He went for an angiogram on 12/10/2020 and the patient had stents of the SFA and popliteal lesions. At that time there was complete resolution of the stenosis/occlusion. He had a  CT scan of the foot that did not show evidence of abscess or osteomyelitis. The patient is still on Ancef follows up with infectious disease next week he has a follow-up with Dr. Jerry Caras of vein and vascular on February 8. He does not complain of pain in his foot however he does have significant diabetic neuropathy. He is using Xeroform to all 3 wounds Past medical history includes type 2 diabetes, peripheral neuropathy, paroxysmal atrial fibrillation with heart blocko Sick sinus syndrome, idiopathic pulmonary fibrosis, congestive heart failure, hypertension witho Postural hypotension, recent CABG at [facility] on 11/04/2020. He no longer has a pacemaker but has had previous cervical spine surgery with hardware at this level We did not redo his ABI in clinic today 1/28; this is a patient I admitted to the clinic last week he had exceptionally complex hospitalizations noted above firstly at [place]. 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 At some point in this he developed a necrotic wound over the right fifth metatarsal head. He underwent revascularization of the left SFA and popliteal with stents. He is still on Ancef. I was concerned about the condition of the left foot wound last time specifically the erythema extending into the forefoot however the erythema looks a lot better today. He saw Dr. Juleen China of infectious disease since he was last here and Dr. Juleen China communicated with me wondering whether we required an MRI given the fact that he had a CT scan of his foot that was negative for osteomyelitis. It is really a moot point since he has hardware in his neck from previous surgery looked at CT scans today dating back from 2019 of his C-spine. In any case I am still very concerned about the necrotic nature of the wound on the left foot. He has small areas on his coccyx left buttock. There was a new area that they discovered on the weekend a small area on the left anterior upper lower leg which apparently was a vein harvest site although I see very little in terms of the scar. This apparently open spontaneously. As his wife works for [employer] somebody was good enough to call in some Bactroban for her and they are using this on this area Electronic Signature(s) Signed: 01/01/2021 5:33:44 PM By: Linton Ham MD Entered By: Linton Ham on 01/01/2021 13:00:29 -------------------------------------------------------------------------------- Physical Exam Details Patient Name: Date of Service: Shane [name] MES H. 01/01/2021 11:00 A M Medical Record Number: [id number] Patient Account Number: [id number] Date of Birth/Sex: Treating RN: [birth year] (80 y.o. [name] Primary Care Provider: Geoffery Lyons Other Clinician: Referring Provider: Treating Provider/Extender: Peter Garter, Constance Goltz in Treatment: 1 Constitutional Sitting or standing Blood Pressure  is within target range for patient.. Pulse regular and within target range for patient.Marland Kitchen Respirations regular, non-labored and within target range.. Temperature is normal and within the target range for the patient.Marland Kitchen Appears in no distress. Notes Wound exam; He has small wounds stage II over the lower coccyx and buttocks. Debris on the surface of these wounds I debrided with a #3 curette down to clean edges. I am going to change to Santyl over these wound areas He has a new area over the apparently vein harvest site of his left upper lower leg. This apparently spontaneously opened we did not identify this last week. I was able to debride this as well some subcutaneous debris. All of this looks quite good underneath The more concerning area is the right  lateral fifth metatarsal head. Completely necrotic surface which was protuberant from the wound surface. I used a #15 scalpel to remove this which is mostly necrotic liquefied fat I was able to identify that the wound extends under the metatarsal head itself. I did a swab culture after I got into the depth of this. Some of this could be liquefied fat but it did look purulent. I was however pleased to see that the amount of erythema on the lateral foot extending into the forefoot look better Electronic Signature(s) Signed: 01/01/2021 5:33:44 PM By: Linton Ham MD Entered By: Linton Ham on 01/01/2021 12:57:50 -------------------------------------------------------------------------------- Physician Orders Details Patient Name: Date of Service: Shane [name], Shane [name] MES H. 01/01/2021 11:00 A M Medical Record Number: [id number] Patient Account Number: [id number] Date of Birth/Sex: Treating RN: 09/15/1941 (80 y.o. [name] Primary Care Provider: Geoffery Lyons Other Clinician: Referring Provider: Treating Provider/Extender: Peter Garter, Constance Goltz in Treatment: 1 Verbal / Phone Orders: No Diagnosis Coding ICD-10  Coding Code Description E11.621 Type 2 diabetes mellitus with foot ulcer E11.51 Type 2 diabetes mellitus with diabetic peripheral angiopathy without gangrene L97.518 Non-pressure chronic ulcer of other part of right foot with other specified severity L89.152 Pressure ulcer of sacral region, stage 2 L89.322 Pressure ulcer of left buttock, stage 2 A49.01 Methicillin susceptible Staphylococcus aureus infection, unspecified site Follow-up Appointments Return Appointment in 1 week. Bathing/ Shower/ Hygiene May shower and wash wound with soap and water. - on days when dressings are changed Edema Control - Lymphedema / SCD / Other Elevate legs to the level of the heart or above for 30 minutes daily and/or when sitting, a frequency of: Avoid standing for long periods of time. Exercise regularly Moisturize legs daily. Off-Loading Turn and reposition every 2 hours - avoid slouching in chair when sitting, weight should be on back of thighs [facility] wound care orders this week; continue Home Health for wound care. May utilize formulary equivalent dressing for wound treatment orders unless otherwise specified. Other Home Health Orders/Instructions: - [facility] Wound Treatment Wound #1 - Foot Wound Laterality: Right, Lateral Cleanser: Soap and Water Every Other Day/30 Days Discharge Instructions: May shower and wash wound with dial antibacterial soap and water prior to dressing change. Prim Dressing: KerraCel Ag Gelling Fiber Dressing, 4x5 in (silver alginate) (Home Health) Every Other Day/30 Days ary Discharge Instructions: Apply silver alginate to wound bed , pack lightly into wound Secondary Dressing: Woven Gauze Sponge, Non-Sterile 4x4 in [facility]) Every Other Day/30 Days Discharge Instructions: Apply over primary dressing as directed. Secured With: Child psychotherapist, Sterile 2x75 (in/in) (Home Health) Every Other Day/30 Days Discharge Instructions: Secure with stretch  gauze as directed. Wound #2 - Coccyx Prim Dressing: Santyl Ointment (Home Health) 1 x Per Day/30 Days ary Discharge Instructions: Apply nickel thick amount to wound bed as instructed Secondary Dressing: ComfortFoam Border, 4x4 in (silicone border) ([place]) 1 x Per Day/30 Days Discharge Instructions: Apply over primary dressing as directed. Wound #3 - Gluteus Wound Laterality: Left Prim Dressing: Santyl Ointment [facility]) Every Other Day/30 Days ary Discharge Instructions: Apply nickel thick amount to wound bed as instructed Secondary Dressing: ComfortFoam Border, 4x4 in (silicone border) [facility]) Every Other Day/30 Days Discharge Instructions: Apply over primary dressing as directed. Wound #4 - Lower Leg Wound Laterality: Left, Medial Cleanser: Soap and Water 1 x Per Day/30 Days Discharge Instructions: May shower and wash wound with dial antibacterial soap and water prior to dressing change. Topical:  Mupirocin Ointment (Home Health) 1 x Per Day/30 Days Discharge Instructions: Apply Mupirocin (Bactroban) as instructed Secondary Dressing: ComfortFoam Border, 4x4 in (silicone border) ([place]) 1 x Per Day/30 Days Discharge Instructions: Apply over primary dressing as directed. Laboratory naerobe culture (MICRO) - right lateral foot Bacteria identified in Unspecified specimen by A LOINC Code: 387-5 Convenience Name: Anerobic culture Electronic Signature(s) Signed: 01/01/2021 5:33:44 PM By: Linton Ham MD Signed: 01/01/2021 5:52:16 PM By: Baruch Gouty RN, BSN Entered By: Baruch Gouty on 01/01/2021 12:23:58 -------------------------------------------------------------------------------- Problem List Details Patient Name: Date of Service: Shane [name], Shane [name] MES H. 01/01/2021 11:00 A M Medical Record Number: [id number] Patient Account Number: [id number] Date of Birth/Sex: Treating RN: 1941/01/24 (80 y.o. [name] Primary Care Provider: Geoffery Lyons Other  Clinician: Referring Provider: Treating Provider/Extender: Peter Garter, Constance Goltz in Treatment: 1 Active Problems ICD-10 Encounter Code Description Active Date MDM Diagnosis E11.621 Type 2 diabetes mellitus with foot ulcer 12/25/2020 No Yes E11.51 Type 2 diabetes mellitus with diabetic peripheral angiopathy without gangrene 12/25/2020 No Yes L97.518 Non-pressure chronic ulcer of other part of right foot with other specified 12/25/2020 No Yes severity L89.152 Pressure ulcer of sacral region, stage 2 12/25/2020 No Yes L89.322 Pressure ulcer of left buttock, stage 2 12/25/2020 No Yes A49.01 Methicillin susceptible Staphylococcus aureus infection, unspecified site 12/25/2020 No Yes L97.821 Non-pressure chronic ulcer of other part of left lower leg limited to breakdown 01/01/2021 No Yes of skin Inactive Problems Resolved Problems Electronic Signature(s) Signed: 01/01/2021 5:33:44 PM By: Linton Ham MD Entered By: Linton Ham on 01/01/2021 12:51:09 -------------------------------------------------------------------------------- Progress Note Details Patient Name: Date of Service: Shane [name], Shane [name] MES H. 01/01/2021 11:00 A M Medical Record Number: [id number] Patient Account Number: [id number] Date of Birth/Sex: Treating RN: 1941-05-13 (80 y.o. [name] Primary Care Provider: Geoffery Lyons Other Clinician: Referring Provider: Treating Provider/Extender: Peter Garter, Constance Goltz in Treatment: 1 Subjective History of Present Illness (HPI) ADMISSION 12/25/2020 This is a 80 year old man who had an elective admission to [place] in early December for coronary artery disease he underwent a CABG x3. He spent in the morning amount of time in the hospital 15 days according to his wife in 10 days in the ICU but I have not researched this issue. When he left [facility] he apparently had the pressure ulcers on the coccyx and the gluteal area. He was at home for a  short period of time however he required readmission to [facility] from 12/08/2020 through 12/16/2020 with increasing fatigue dyspnea fever and cough. 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 The patient is currently on Ancef. During the hospitalization he was discovered to have a low ABI of 0.62 on the right. He went for an angiogram on 12/10/2020 and the patient had stents of the SFA and popliteal lesions. At that time there was complete resolution of the stenosis/occlusion. He had a CT scan of the foot that did not show evidence of abscess or osteomyelitis. The patient is still on Ancef follows up with infectious disease next week he has a follow-up with Dr. Jerry Caras of vein and vascular on February 8. He does not complain of pain in his foot however he does have significant diabetic neuropathy. He is using Xeroform to all 3 wounds Past medical history includes type 2 diabetes, peripheral neuropathy, paroxysmal atrial fibrillation with heart blocko Sick sinus syndrome,  idiopathic pulmonary fibrosis, congestive heart failure, hypertension witho Postural hypotension, recent CABG at [facility] on 11/04/2020. He no longer has a pacemaker but has had previous cervical spine surgery with hardware at this level We did not redo his ABI in clinic today 1/28; this is a patient I admitted to the clinic last week he had exceptionally complex hospitalizations noted above firstly at [place]. 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 At some point in this he developed a necrotic wound over the left fifth metatarsal head. He underwent revascularization of the left SFA and popliteal with stents. He is still on Ancef. I was concerned about the condition of the left foot wound last time specifically the erythema extending into the forefoot however the erythema looks a  lot better today. He saw Dr. Juleen China of infectious disease since he was last here and Dr. Juleen China communicated with me wondering whether we required an MRI given the fact that he had a CT scan of his foot that was negative for osteomyelitis. It is really a moot point since he has hardware in his neck from previous surgery looked at CT scans today dating back from 2019 of his C-spine. In any case I am still very concerned about the necrotic nature of the wound on the left foot. He has small areas on his coccyx left buttock. There was a new area that they discovered on the weekend a small area on the left anterior upper lower leg which apparently was a vein harvest site although I see very little in terms of the scar. This apparently open spontaneously. As his wife works for [employer] somebody was good enough to call in some Bactroban for her and they are using this on this area Objective Constitutional Sitting or standing Blood Pressure is within target range for patient.. Pulse regular and within target range for patient.Marland Kitchen Respirations regular, non-labored and within target range.. Temperature is normal and within the target range for the patient.Marland Kitchen Appears in no distress. Vitals Time Taken: 11:15 AM, Height: 72 in, Weight: 256 lbs, BMI: 34.7, Temperature: 98.7 F, Pulse: 77 bpm, Respiratory Rate: 17 breaths/min, Blood Pressure: 137/66 mmHg, Capillary Blood Glucose: 96 mg/dl. General Notes: Wound exam; ooHe has small wounds stage II over the lower coccyx and buttocks. Debris on the surface of these wounds I debrided with a #3 curette down to clean edges. I am going to change to Santyl over these wound areas [facility] has a new area over the apparently vein harvest site of his left upper lower leg. This apparently spontaneously opened we did not identify this last week. I was able to debride this as well some subcutaneous debris. All of this looks quite good underneath ooThe more concerning area  is the right lateral fifth metatarsal head. Completely necrotic surface which was protuberant from the wound surface. I used a #15 scalpel to remove this which is mostly necrotic liquefied fat I was able to identify that the wound extends under the metatarsal head itself. I did a swab culture after I got into the depth of this. Some of this could be liquefied fat but it did look purulent. I was however pleased to see that the amount of erythema on the lateral foot extending into the forefoot look better Integumentary (Hair, Skin) Wound #1 status is Open. Original cause of wound was Gradually Appeared. The wound is located on the Right,Lateral Foot. The wound measures 2.2cm length x 3cm width x 0.1cm depth; 5.184cm^2 area  and 0.518cm^3 volume. There is no tunneling or undermining noted. There is a medium amount of serosanguineous drainage noted. The wound margin is distinct with the outline attached to the wound base. There is small (1-33%) pink, hyper - granulation within the wound bed. There is a large (67-100%) amount of necrotic tissue within the wound bed including Eschar and Adherent Slough. Wound #2 status is Open. Original cause of wound was Gradually Appeared. The wound is located on the Coccyx. The wound measures 1.1cm length x 0.3cm width x 0.1cm depth; 0.259cm^2 area and 0.026cm^3 volume. There is Fat Layer (Subcutaneous Tissue) exposed. There is no tunneling or undermining noted. There is a medium amount of serosanguineous drainage noted. The wound margin is distinct with the outline attached to the wound base. There is no granulation within the wound bed. There is a large (67-100%) amount of necrotic tissue within the wound bed including Adherent Slough. Wound #3 status is Open. Original cause of wound was Gradually Appeared. The wound is located on the Left Gluteus. The wound measures 0.3cm length x 1.6cm width x 0.01cm depth; 0.377cm^2 area and 0.004cm^3 volume. There is Fat Layer  (Subcutaneous Tissue) exposed. There is no tunneling or undermining noted. There is a medium amount of serosanguineous drainage noted. The wound margin is distinct with the outline attached to the wound base. There is no granulation within the wound bed. There is a large (67-100%) amount of necrotic tissue within the wound bed including Adherent Slough. Wound #4 status is Open. Original cause of wound was Surgical Injury. The wound is located on the Left,Medial Lower Leg. The wound measures 1.1cm length x 0.5cm width x 0.1cm depth; 0.432cm^2 area and 0.043cm^3 volume. There is no tunneling or undermining noted. There is a medium amount of serosanguineous drainage noted. The wound margin is distinct with the outline attached to the wound base. There is no granulation within the wound bed. There is a large (67- 100%) amount of necrotic tissue within the wound bed including Eschar and Adherent Slough. Assessment Active Problems ICD-10 Type 2 diabetes mellitus with foot ulcer Type 2 diabetes mellitus with diabetic peripheral angiopathy without gangrene Non-pressure chronic ulcer of other part of right foot with other specified severity Pressure ulcer of sacral region, stage 2 Pressure ulcer of left buttock, stage 2 Methicillin susceptible Staphylococcus aureus infection, unspecified site Non-pressure chronic ulcer of other part of left lower leg limited to breakdown of skin Procedures Wound #1 Pre-procedure diagnosis of Wound #1 is a Diabetic Wound/Ulcer of the Lower Extremity located on the Right,Lateral Foot .Severity of Tissue Pre Debridement is: Fat layer exposed. There was a Excisional Skin/Subcutaneous Tissue Debridement with a total area of 6.6 sq cm performed by Ricard Dillon., MD. With the following instrument(s): Blade, Curette, and Forceps to remove Viable and Non-Viable tissue/material. Material removed includes Fat, Subcutaneous Tissue, and Slough after achieving pain control using  Other (Benzocaine 20% spray). 1 specimen was taken by a Swab and sent to the lab per facility protocol. A time out was conducted at 11:55, prior to the start of the procedure. A Minimum amount of bleeding was controlled with Pressure. The procedure was tolerated well with a pain level of 0 throughout and a pain level of 0 following the procedure. Post Debridement Measurements: 2.2cm length x 3cm width x 1.5cm depth; 7.775cm^3 volume. Character of Wound/Ulcer Post Debridement requires further debridement. Severity of Tissue Post Debridement is: Fat layer exposed. Post procedure Diagnosis Wound #1: Same as Pre-Procedure Wound #2 Pre-procedure  diagnosis of Wound #2 is a Pressure Ulcer located on the Coccyx . There was a Excisional Skin/Subcutaneous Tissue Debridement with a total area of 0.33 sq cm performed by Ricard Dillon., MD. With the following instrument(s): Blade, Curette, and Forceps to remove Viable and Non-Viable tissue/material. Material removed includes Subcutaneous Tissue and Slough and after achieving pain control using Other (Benzocaine 20% spray). 1 specimen was taken by a Swab and sent to the lab per facility protocol. A time out was conducted at 11:55, prior to the start of the procedure. A Minimum amount of bleeding was controlled with Pressure. The procedure was tolerated well with a pain level of 0 throughout and a pain level of 0 following the procedure. Post Debridement Measurements: 1.1cm length x 0.3cm width x 0.1cm depth; 0.026cm^3 volume. Post debridement Stage noted as Category/Stage II. Character of Wound/Ulcer Post Debridement requires further debridement. Post procedure Diagnosis Wound #2: Same as Pre-Procedure Wound #3 Pre-procedure diagnosis of Wound #3 is a Pressure Ulcer located on the Left Gluteus . There was a Excisional Skin/Subcutaneous Tissue Debridement with a total area of 0.48 sq cm performed by Ricard Dillon., MD. With the following instrument(s):  Blade, Curette, and Forceps to remove Viable and Non-Viable tissue/material. Material removed includes Fat, Subcutaneous Tissue, and Slough after achieving pain control using Other (Benzocaine 20% spray). 1 specimen was taken by a Swab and sent to the lab per facility protocol. A time out was conducted at 11:55, prior to the start of the procedure. A Minimum amount of bleeding was controlled with Pressure. The procedure was tolerated well with a pain level of 0 throughout and a pain level of 0 following the procedure. Post Debridement Measurements: 0.3cm length x 1.6cm width x 0.1cm depth; 0.038cm^3 volume. Post debridement Stage noted as Category/Stage II. Character of Wound/Ulcer Post Debridement requires further debridement. Post procedure Diagnosis Wound #3: Same as Pre-Procedure Wound #4 Pre-procedure diagnosis of Wound #4 is an Open Surgical Wound located on the Left,Medial Lower Leg . There was a Excisional Skin/Subcutaneous Tissue Debridement with a total area of 0.55 sq cm performed by Ricard Dillon., MD. With the following instrument(s): Blade, Curette, and Forceps to remove Viable and Non-Viable tissue/material. Material removed includes Fat, Subcutaneous Tissue, and Slough after achieving pain control using Other (Benzocaine 20% spray). 1 specimen was taken by a Swab and sent to the lab per facility protocol. A time out was conducted at 11:55, prior to the start of the procedure. A Minimum amount of bleeding was controlled with Pressure. The procedure was tolerated well with a pain level of 0 throughout and a pain level of 0 following the procedure. Post Debridement Measurements: 1.1cm length x 0.5cm width x 0.1cm depth; 0.043cm^3 volume. Character of Wound/Ulcer Post Debridement requires further debridement. Post procedure Diagnosis Wound #4: Same as Pre-Procedure Plan Follow-up Appointments: Return Appointment in 1 week. Bathing/ Shower/ Hygiene: May shower and wash wound with  soap and water. - on days when dressings are changed Edema Control - Lymphedema / SCD / Other: Elevate legs to the level of the heart or above for 30 minutes daily and/or when sitting, a frequency of: Avoid standing for long periods of time. Exercise regularly Moisturize legs daily. Off-Loading: Turn and reposition every 2 hours - avoid slouching in chair when sitting, weight should be on back of thighs Home Health: New wound care orders this week; continue Home Health for wound care. May utilize formulary equivalent dressing for wound treatment orders unless otherwise specified. Other  Home Health Orders/Instructions: [facility] Laboratory ordered were: Anerobic culture - right lateral foot WOUND #1: - Foot Wound Laterality: Right, Lateral Cleanser: Soap and Water Every Other Day/30 Days Discharge Instructions: May shower and wash wound with dial antibacterial soap and water prior to dressing change. Prim Dressing: KerraCel Ag Gelling Fiber Dressing, 4x5 in (silver alginate) (Home Health) Every Other Day/30 Days ary Discharge Instructions: Apply silver alginate to wound bed , pack lightly into wound Secondary Dressing: Woven Gauze Sponge, Non-Sterile 4x4 in [facility]) Every Other Day/30 Days Discharge Instructions: Apply over primary dressing as directed. Secured With: Child psychotherapist, Sterile 2x75 (in/in) (Home Health) Every Other Day/30 Days Discharge Instructions: Secure with stretch gauze as directed. WOUND #2: - Coccyx Wound Laterality: Prim Dressing: Santyl Ointment (Home Health) 1 x Per Day/30 Days ary Discharge Instructions: Apply nickel thick amount to wound bed as instructed Secondary Dressing: ComfortFoam Border, 4x4 in (silicone border) ([place]) 1 x Per Day/30 Days Discharge Instructions: Apply over primary dressing as directed. WOUND #3: - Gluteus Wound Laterality: Left Prim Dressing: Santyl Ointment [facility]) Every Other Day/30  Days ary Discharge Instructions: Apply nickel thick amount to wound bed as instructed Secondary Dressing: ComfortFoam Border, 4x4 in (silicone border) [facility]) Every Other Day/30 Days Discharge Instructions: Apply over primary dressing as directed. WOUND #4: - Lower Leg Wound Laterality: Left, Medial Cleanser: Soap and Water 1 x Per Day/30 Days Discharge Instructions: May shower and wash wound with dial antibacterial soap and water prior to dressing change. Topical: Mupirocin Ointment (Home Health) 1 x Per Day/30 Days Discharge Instructions: Apply Mupirocin (Bactroban) as instructed Secondary Dressing: ComfortFoam Border, 4x4 in (silicone border) ([place]) 1 x Per Day/30 Days Discharge Instructions: Apply over primary dressing as directed. 1. I change the dressing to the right lateral foot to silver alginate. There is too much purulent debris here. This is likely to require further debridements unfortunately I did not see any viable tissue here and the wound extends under the metatarsal head itself. There is clearly exposed tendon and probably bone at the depths of this. It would be possible to get a bone culture here but he is already on IV Ancef presumably MSSA 2. He sees vascular in follow-up next week. He has been revascularized I will be interested to see how much blood flow there is in the right foot 3. New wound on the vein harvest site I continued the Bactroban that it already been prescribed if this does not work collagen would be fine 4. The Santyl that they got for his right foot last week I am going to use on the coccyx and buttock wounds. 5. Await the swab culture of the deep tissue in the depth of the area on the right fifth metatarsal head. I think he may need to have his antibiotics perhaps augmented. I will need to see if the actual area on the right foot was cultured in the hospital Electronic Signature(s) Signed: 01/01/2021 5:33:44 PM By: Linton Ham MD Entered  By: Linton Ham on 01/01/2021 13:01:09 -------------------------------------------------------------------------------- SuperBill Details Patient Name: Date of Service: Shane [name], Shane [name] MES H. 01/01/2021 Medical Record Number: [id number] Patient Account Number: [id number] Date of Birth/Sex: Treating RN: 1941-02-27 (80 y.o. [name] Primary Care Provider: Geoffery Lyons Other Clinician: Referring Provider: Treating Provider/Extender: Peter Garter, Constance Goltz in Treatment: 1 Diagnosis Coding ICD-10 Codes Code Description [phone] Type 2 diabetes mellitus with foot ulcer E11.51 Type 2 diabetes mellitus with diabetic peripheral  angiopathy without gangrene L97.518 Non-pressure chronic ulcer of other part of right foot with other specified severity L89.152 Pressure ulcer of sacral region, stage 2 L89.322 Pressure ulcer of left buttock, stage 2 A49.01 Methicillin susceptible Staphylococcus aureus infection, unspecified site Facility Procedures CPT4 Code: [id number] Description: 63845 - DEB SUBQ TISSUE 20 SQ CM/< ICD-10 Diagnosis Description L97.518 Non-pressure chronic ulcer of other part of right foot with other specified sev L89.152 Pressure ulcer of sacral region, stage 2 L89.322 Pressure ulcer of left buttock,  stage 2 Modifier: erity Quantity: 1 Physician Procedures : CPT4 Code Description Modifier [id number] 11042 - WC PHYS SUBQ TISS 20 SQ CM ICD-10 Diagnosis Description L97.518 Non-pressure chronic ulcer of other part of right foot with other specified severity L89.152 Pressure ulcer of sacral region, stage 2 L89.322  Pressure ulcer of left buttock, stage 2 Quantity: 1 Electronic Signature(s) Signed: 01/01/2021 5:33:44 PM By: Linton Ham MD Entered By: Linton Ham on 01/01/2021 13:01:22

## 2021-01-01 NOTE — Telephone Encounter (Signed)
Upon chart review, I became aware that Mrs. Eicher had contacted Dr. Jacalyn Lefevre office yesterday. Mrs. Lucier reported that her husband was experiencing weakness in his left arm, and legs. She also reported that he was experiencing dizziness. It was reported that the patient had an episode of hypertension with his systolic pressure reaching the 150's.  Mrs. Netherton stated it does not usually run that high.   Upon becoming aware of these symptoms, I contacted Dr. Chase Caller to ensure it was safe for the subject to attend his research visit scheduled for Monday 01/04/21. Dr. Chase Caller was notified that the subject did not want to travel to the emergency room for an evaluation. Dr. Chase Caller stated that subject should speak with him directly on Sunday prior to his research visit to ensure he is safe to continue.  I contacted Mrs. Minarik to let her know of Dr. Golden Pop request. I provided his telephone number to Mrs. Gradel. She stated that she would call him on Sunday afternoon.

## 2021-01-03 NOTE — Telephone Encounter (Signed)
Wife called at my request 3:55 PM 01/03/2021 Sunday. Se says, Shane [name] May 16, 1941 subject on fiborgen trial wants to come 01/04/21 for research infusion . ASked if symptoms of few days ago have resolved. She said yes. She also said foot infection is improving. He feels stable. She says he absolutely wants to come and get research drug  Ok from [place] standpoint to visit to come for infusion. 01/04/21 . Willl do pre infusion assessment  They will come [facility] valet parking at [facility]    Dr. Brand Males, M.D., F.C.C.P, ACRP-CPI Pulmonary and Critical Care Medicine Research Investigator, [facility] [place] Staff Physician, [place] Director - Interstitial Lung Disease  Program  [facility] [place], [place], [address]   Pager: [phone], If no answer  OR between  19:00-7:00h: page [phone] Telephone (research): [phone]  3:58 PM 01/03/2021   3:58 PM 01/03/2021

## 2021-01-04 ENCOUNTER — Ambulatory Visit (HOSPITAL_COMMUNITY)
Admission: RE | Admit: 2021-01-04 | Discharge: 2021-01-04 | Disposition: A | Discharge status: Home / Self Care | Payer: Medicare Other | Source: Ambulatory Visit | Attending: Internal Medicine | Admitting: Internal Medicine

## 2021-01-04 ENCOUNTER — Other Ambulatory Visit: Payer: Self-pay

## 2021-01-04 DIAGNOSIS — Z006 Encounter for examination for normal comparison and control in clinical research program: Secondary | ICD-10-CM

## 2021-01-04 DIAGNOSIS — J84112 Idiopathic pulmonary fibrosis: Secondary | ICD-10-CM

## 2021-01-04 MED ORDER — STUDY - FIBROGEN - PAMREVLUMAB 10 MG/ML IV INFUSION (OPEN LABEL) (PI-RAMASWAMY)
30.0000 mg/kg | Freq: Once | INTRAVENOUS | Status: AC
  Administered 2021-01-04: 3650 mg via INTRAVENOUS
  Filled 2021-01-04: qty 365

## 2021-01-04 NOTE — Research (Signed)
Title: [id number] (FibroGen Study) is a Phase 3, randomized, double-blind, placebo-controlled multicenter international study to evaluate evaluate the efficacy and safety of 30 mg/kg IV infusions of pamrevlumab administered every 3 weeks for 52 weeks as compared to placebo in subjects with Idiopathic Pulmonary Fibrosis. Primary end point is: change in FVC from baseline at week 52. ZEPHYRUS STUDY  Protocol #: [id number], Clinical Trials #: [id number] Sponsor: www.[employer].com  ([place], [place], [place])  Xxxxxxxxxxxxxx Updates - Sept 2021 - 1 anaphylactic reaction on 10th infusion on [place] patient in [place] - causally related - 2.04% incidence of PE (between IPF 6 patients, and 8 with pancreatic cancer of 685 in studye with 562 thought to be on IP) - possible cause by IP but sponsor believes no changes to study program xxxxxxxxxxxxxxxxxxxxxxxxxxxxxxxx  Scientist, physiological / Research RN note : This visit for Subject Shane [name] with DOB: 06-10-1941 on 01/04/2021 for the above protocol is Visit/Encounter #EX Week 60 and is for purpose of research. The consent for this encounter is under Protocol Version Amendment 5 and is currently IRB approved. Subject expressed continued interest and consent in continuing as a study subject. Subject confirmed that there was no change in contact information (e.g. address, telephone, email). Subject thanked for participation in research and contribution to science.   In this visit 01/04/2021 the subject is not required to be evaluated by the investigator. Subject was notified of the updates mentioned above. Subject verbalized understanding and stated it was okay to continue.  Upon completing Pre-infusion vital signs, PI, Dr. Chase Caller was contacted. Vital signs were reported and PI deemed it safe for subject to receive his infusion. Subject's EX Week 57 visit was missed due to hospitalization.   All the procedures and assessments for EX Week 60 were  performed per the above protocol. Subject reported changes to his medications following his discharge from the hospital. Subject also reported new procedures and non-drug therapies upon discharge from the hospital.   Study drug was infused without problems. Please refer to subject's paper source binder for further details of the visit. The subject will return in approximately 3 weeks for his next infusion.  Signed by Gwendolyn Grant Clinical Research Coordinator / Nurse [facility]  [place], [place] 11:54 AM 01/04/2021

## 2021-01-04 NOTE — Progress Notes (Signed)
Shane, [name] ([id number]) Visit Report for 01/01/2021 Arrival Information Details Patient Name: Date of Service: Shane [name] MES H. 01/01/2021 11:00 A M Medical Record Number: [id number] Patient Account Number: [id number] Date of Birth/Sex: Treating RN: 04/20/1941 (80 y.o. [name] Primary Care Jamile Rekowski: Geoffery Lyons Other Clinician: Referring Airika Alkhatib: Treating Haleigh Desmith/Extender: Peter Garter, Constance Goltz in Treatment: 1 Visit Information History Since Last Visit Added or deleted any medications: No Patient Arrived: Wheel Chair Any new allergies or adverse reactions: No Arrival Time: 11:15 Had a fall or experienced change in No Accompanied By: wife activities of daily living that may affect Transfer Assistance: None risk of falls: Patient Identification Verified: Yes Signs or symptoms of abuse/neglect since last visito No Secondary Verification Process Completed: Yes Hospitalized since last visit: No Patient Requires Transmission-Based Precautions: No Implantable device outside of the clinic excluding No Patient Has Alerts: No cellular tissue based products placed in the center since last visit: Has Dressing in Place as Prescribed: Yes Pain Present Now: Yes Electronic Signature(s) Signed: 01/04/2021 11:41:01 AM By: Sandre Kitty Entered By: Sandre Kitty on 01/01/2021 11:15:49 -------------------------------------------------------------------------------- Encounter Discharge Information Details Patient Name: Date of Service: Shane [name], Shane [name] MES H. 01/01/2021 11:00 A M Medical Record Number: [id number] Patient Account Number: [id number] Date of Birth/Sex: Treating RN: 17-Jun-1941 (80 y.o. Shane [name] Primary Care Justine Dines: Geoffery Lyons Other Clinician: Referring Amaka Gluth: Treating Edwen Mclester/Extender: Peter Garter, Constance Goltz in Treatment: 1 Encounter Discharge Information Items Post Procedure Vitals Discharge  Condition: Stable Temperature (F): 98.7 Ambulatory Status: Wheelchair Pulse (bpm): 77 Discharge Destination: Home Respiratory Rate (breaths/min): 17 Transportation: Private Auto Blood Pressure (mmHg): 137/66 Accompanied By: wife Schedule Follow-up Appointment: Yes Clinical Summary of Care: Electronic Signature(s) Signed: 01/01/2021 5:42:38 PM By: Deon Pilling Entered By: Deon Pilling on 01/01/2021 14:17:10 -------------------------------------------------------------------------------- Lower Extremity Assessment Details Patient Name: Date of Service: Shane [name] MES H. 01/01/2021 11:00 A M Medical Record Number: [id number] Patient Account Number: [id number] Date of Birth/Sex: Treating RN: October 02, 1941 (80 y.o. Shane [name] Primary Care Estelene Carmack: Geoffery Lyons Other Clinician: Referring Piccola Arico: Treating Shane [name]/Extender: Peter Garter, Constance Goltz in Treatment: 1 Edema Assessment Assessed: Shirlyn Goltz: Yes] Patrice Paradise: Yes] Edema: [Left: Yes] [Right: Yes] Calf Left: Right: Point of Measurement: 38 cm From Medial Instep 46 cm 47 cm Ankle Left: Right: Point of Measurement: 12 cm From Medial Instep 25.5 cm 29 cm Vascular Assessment Pulses: Dorsalis Pedis Palpable: [Left:Yes] [Right:Yes] Electronic Signature(s) Signed: 01/01/2021 5:42:38 PM By: Deon Pilling Entered By: Deon Pilling on 01/01/2021 11:47:21 -------------------------------------------------------------------------------- Multi Wound Chart Details Patient Name: Date of Service: Shane [name], Shane [name] MES H. 01/01/2021 11:00 A M Medical Record Number: [id number] Patient Account Number: [id number] Date of Birth/Sex: Treating RN: 1941/07/07 (80 y.o. [name] Primary Care Kerolos Nehme: Geoffery Lyons Other Clinician: Referring Rebeckah Masih: Treating Ranulfo Kall/Extender: Peter Garter, Constance Goltz in Treatment: 1 Vital Signs Height(in): 41 Capillary Blood Glucose(mg/dl): 43 Weight(lbs):  256 Pulse(bpm): 65 Body Mass Index(BMI): 57 Blood Pressure(mmHg): 137/66 Temperature(F): 98.7 Respiratory Rate(breaths/min): 17 Photos: [1:No Photos Right, Lateral Foot] [2:No Photos Coccyx] [3:No Photos Left Gluteus] Wound Location: [1:Gradually Appeared] [2:Gradually Appeared] [3:Gradually Appeared] Wounding Event: [1:Diabetic Wound/Ulcer of the Lower] [2:Pressure Ulcer] [3:Pressure Ulcer] Primary Etiology: [1:Extremity Sleep Apnea, Arrhythmia, Coronary] [2:Sleep Apnea, Arrhythmia, Coronary] [3:Sleep Apnea, Arrhythmia, Coronary] Comorbid History: [1:Artery Disease, Hypertension, Peripheral Arterial Disease, Type II Diabetes, Neuropathy 12/08/2020] [2:Artery Disease, Hypertension, Peripheral Arterial Disease, Type II Diabetes, Neuropathy 11/13/2020] [3:Artery Disease, Hypertension,  Peripheral Arterial Disease,  Type II Diabetes, Neuropathy 11/13/2020] Date Acquired: [1:1] [2:1] [3:1] Weeks of Treatment: [1:Open] [2:Open] [3:Open] Wound Status: [1:2.2x3x0.1] [2:1.1x0.3x0.1] [3:0.3x1.6x0.01] Measurements L x W x D (cm) [1:5.184] [2:0.259] [3:0.377] A (cm) : rea [1:0.518] [2:0.026] [3:0.004] Volume (cm) : [1:-4.80%] [2:45.00%] [3:4.10%] % Reduction in A rea: [1:-4.60%] [2:72.30%] [3:94.90%] % Reduction in Volume: [1:Grade 2] [2:Category/Stage II] [3:Category/Stage II] Classification: [1:Medium] [2:Medium] [3:Medium] Exudate A mount: [1:Serosanguineous] [2:Serosanguineous] [3:Serosanguineous] Exudate Type: [1:red, brown] [2:red, brown] [3:red, brown] Exudate Color: [1:Distinct, outline attached] [2:Distinct, outline attached] [3:Distinct, outline attached] Wound Margin: [1:Small (1-33%)] [2:None Present (0%)] [3:None Present (0%)] Granulation A mount: [1:Pink, Hyper-granulation] [2:N/A] [3:N/A] Granulation Quality: [1:Large (67-100%)] [2:Large (67-100%)] [3:Large (67-100%)] Necrotic A mount: [1:Eschar, Adherent Slough] [2:Adherent Slough] [3:Adherent Slough] Necrotic Tissue: [1:Fascia:  No] [2:Fat Layer (Subcutaneous Tissue): Yes Fat Layer (Subcutaneous Tissue): Yes] Exposed Structures: [1:Fat Layer (Subcutaneous Tissue): No Tendon: No Muscle: No Joint: No Bone: No None] [2:Fascia: No Tendon: No Muscle: No Joint: No Bone: No None] [3:Fascia: No Tendon: No Muscle: No Joint: No Bone: No None] Epithelialization: [1:Debridement - Excisional] [2:Debridement - Excisional] [3:Debridement - Excisional] Debridement: Pre-procedure Verification/Time Out 11:55 [2:11:55] [3:11:55] Taken: [1:Other] [2:Other] [3:Other] Pain Control: [1:Fat, Subcutaneous, Slough] [2:Subcutaneous, Slough] [3:Fat, Subcutaneous, Slough] Tissue Debrided: [1:Skin/Subcutaneous Tissue] [2:Skin/Subcutaneous Tissue] [3:Skin/Subcutaneous Tissue] Level: [1:6.6] [2:0.33] [3:0.48] Debridement A (sq cm): [1:rea Blade, Curette, Forceps] [2:Blade, Curette, Forceps] [3:Blade, Curette, Forceps] Instrument: [1:Swab] [2:Swab] [3:Swab] Specimen: [1:1] [2:1] [3:1] Number of Specimens Taken: [1:Minimum] [2:Minimum] [3:Minimum] Bleeding: [1:Pressure] [2:Pressure] [3:Pressure] Hemostasis A chieved: [1:0] [2:0] [3:0] Procedural Pain: [1:0] [2:0] [3:0] Post Procedural Pain: [1:Procedure was tolerated well] [2:Procedure was tolerated well] [3:Procedure was tolerated well] Debridement Treatment Response: [1:2.2x3x1.5] [2:1.1x0.3x0.1] [3:0.3x1.6x0.1] Post Debridement Measurements L x W x D (cm) [1:7.775] [2:0.026] [3:0.038] Post Debridement Volume: (cm) [1:N/A] [2:Category/Stage II] [3:Category/Stage II] Post Debridement Stage: [1:Debridement] [2:Debridement] [3:Debridement] Wound Number: 4 N/A N/A Photos: No Photos N/A N/A Left, Medial Lower Leg N/A N/A Wound Location: Surgical Injury N/A N/A Wounding Event: Open Surgical Wound N/A N/A Primary Etiology: Sleep Apnea, Arrhythmia, Coronary N/A N/A Comorbid History: Artery Disease, Hypertension, Peripheral Arterial Disease, Type II Diabetes, Neuropathy 11/04/2020 N/A N/A Date  Acquired: 0 N/A N/A Weeks of Treatment: Open N/A N/A Wound Status: 1.1x0.5x0.1 N/A N/A Measurements L x W x D (cm) 0.432 N/A N/A A (cm) : rea 0.043 N/A N/A Volume (cm) : 0.00% N/A N/A % Reduction in A rea: 0.00% N/A N/A % Reduction in Volume: Unclassifiable N/A N/A Classification: Medium N/A N/A Exudate A mount: Serosanguineous N/A N/A Exudate Type: red, brown N/A N/A Exudate Color: Distinct, outline attached N/A N/A Wound Margin: None Present (0%) N/A N/A Granulation A mount: N/A N/A N/A Granulation Quality: Large (67-100%) N/A N/A Necrotic A mount: Eschar, Adherent Slough N/A N/A Necrotic Tissue: Fascia: No N/A N/A Exposed Structures: Fat Layer (Subcutaneous Tissue): No Tendon: No Muscle: No Joint: No Bone: No None N/A N/A Epithelialization: Debridement - Excisional N/A N/A Debridement: Pre-procedure Verification/Time Out 11:55 N/A N/A Taken: Other N/A N/A Pain Control: Fat, Subcutaneous, Slough N/A N/A Tissue Debrided: Skin/Subcutaneous Tissue N/A N/A Level: 0.55 N/A N/A Debridement A (sq cm): rea Blade, Curette, Forceps N/A N/A Instrument: Swab N/A N/A Specimen: 1 N/A N/A Number of Specimens Taken: Minimum N/A N/A Bleeding: Pressure N/A N/A Hemostasis Achieved: 0 N/A N/A Procedural Pain: 0 N/A N/A Post Procedural Pain: Procedure was tolerated well N/A N/A Debridement Treatment Response: 1.1x0.5x0.1 N/A N/A Post Debridement Measurements L x W x D (cm) 0.043 N/A N/A Post Debridement Volume: (cm) N/A  N/A N/A Post Debridement Stage: Debridement N/A N/A Procedures Performed: Treatment Notes Electronic Signature(s) Signed: 01/01/2021 5:33:44 PM By: Linton Ham MD Signed: 01/01/2021 5:52:16 PM By: Baruch Gouty RN, BSN Entered By: Linton Ham on 01/01/2021 12:51:18 -------------------------------------------------------------------------------- Multi-Disciplinary Care Plan Details Patient Name: Date of Service: Shane [name],  Shane [name] MES H. 01/01/2021 11:00 A M Medical Record Number: [id number] Patient Account Number: [id number] Date of Birth/Sex: Treating RN: 1941-01-16 (80 y.o. [name] Primary Care Silvia Hightower: Geoffery Lyons Other Clinician: Referring Aysha Livecchi: Treating Tory Mckissack/Extender: Peter Garter, Constance Goltz in Treatment: 1 Active Inactive Abuse / Safety / Falls / Self Care Management Nursing Diagnoses: Potential for falls Goals: Patient/caregiver will verbalize/demonstrate measures taken to prevent injury and/or falls Date Initiated: 12/25/2020 Target Resolution Date: 01/22/2021 Goal Status: Active Interventions: Assess fall risk on admission and as needed Assess impairment of mobility on admission and as needed per policy Notes: Nutrition Nursing Diagnoses: Impaired glucose control: actual or potential Potential for alteratiion in Nutrition/Potential for imbalanced nutrition Goals: Patient/caregiver will maintain therapeutic glucose control Date Initiated: 12/25/2020 Target Resolution Date: 01/22/2021 Goal Status: Active Interventions: Assess HgA1c results as ordered upon admission and as needed Assess patient nutrition upon admission and as needed per policy Provide education on elevated blood sugars and impact on wound healing Treatment Activities: Patient referred to Primary Care Physician for further nutritional evaluation : 12/25/2020 Notes: Pressure Nursing Diagnoses: Knowledge deficit related to causes and risk factors for pressure ulcer development Knowledge deficit related to management of pressures ulcers Potential for impaired tissue integrity related to pressure, friction, moisture, and shear Goals: Patient/caregiver will verbalize understanding of pressure ulcer management Date Initiated: 12/25/2020 Target Resolution Date: 01/22/2021 Goal Status: Active Interventions: Assess: immobility, friction, shearing, incontinence upon admission and as  needed Assess potential for pressure ulcer upon admission and as needed Provide education on pressure ulcers Notes: Tissue Oxygenation Nursing Diagnoses: Actual ineffective tissue perfusion; peripheral (select once diagnosis is confirmed) Knowledge deficit related to disease process and management Goals: Patient/caregiver will verbalize understanding of disease process and disease management Date Initiated: 12/25/2020 Target Resolution Date: 01/22/2021 Goal Status: Active Interventions: Assess patient understanding of disease process and management upon diagnosis and as needed Assess peripheral arterial status upon admission and as needed Provide education on tissue oxygenation and ischemia Treatment Activities: T ordered outside of clinic : 12/25/2020 est Notes: Wound/Skin Impairment Nursing Diagnoses: Impaired tissue integrity Knowledge deficit related to ulceration/compromised skin integrity Goals: Patient/caregiver will verbalize understanding of skin care regimen Date Initiated: 12/25/2020 Target Resolution Date: 01/22/2021 Goal Status: Active Ulcer/skin breakdown will have a volume reduction of 30% by week 4 Date Initiated: 12/25/2020 Target Resolution Date: 01/22/2021 Goal Status: Active Interventions: Assess patient/caregiver ability to obtain necessary supplies Assess patient/caregiver ability to perform ulcer/skin care regimen upon admission and as needed Assess ulceration(s) every visit Provide education on ulcer and skin care Treatment Activities: Skin care regimen initiated : 12/25/2020 Topical wound management initiated : 12/25/2020 Notes: Electronic Signature(s) Signed: 01/01/2021 5:52:16 PM By: Baruch Gouty RN, BSN Entered By: Baruch Gouty on 01/01/2021 11:49:41 -------------------------------------------------------------------------------- Pain Assessment Details Patient Name: Date of Service: Shane [name], Shane [name] MES H. 01/01/2021 11:00 A M Medical Record  Number: [id number] Patient Account Number: [id number] Date of Birth/Sex: Treating RN: 1941-05-10 (80 y.o. [name] Primary Care Momen Ham: Geoffery Lyons Other Clinician: Referring Destenee Guerry: Treating Enyah Moman/Extender: Peter Garter, Constance Goltz in Treatment: 1 Active Problems Location of Pain Severity and Description of Pain Patient Has Paino Yes Site Locations Rate the  pain. Current Pain Level: 2 Pain Management and Medication Current Pain Management: Electronic Signature(s) Signed: 01/01/2021 5:52:16 PM By: Baruch Gouty RN, BSN Signed: 01/04/2021 11:41:01 AM By: Sandre Kitty Entered By: Sandre Kitty on 01/01/2021 11:16:27 -------------------------------------------------------------------------------- Patient/Caregiver Education Details Patient Name: Date of Service: Shane [name] MES H. 1/28/2022andnbsp11:00 A M Medical Record Number: [id number] Patient Account Number: [id number] Date of Birth/Gender: Treating RN: [birth year] (80 y.o. [name] Primary Care Physician: Geoffery Lyons Other Clinician: Referring Physician: Treating Physician/Extender: Inetta Fermo in Treatment: 1 Education Assessment Education Provided To: Patient Education Topics Provided Elevated Blood Sugar/ Impact on Healing: Methods: Explain/Verbal Responses: Reinforcements needed, State content correctly Pressure: Methods: Explain/Verbal Responses: Reinforcements needed, State content correctly Wound/Skin Impairment: Methods: Explain/Verbal Responses: Reinforcements needed, State content correctly Electronic Signature(s) Signed: 01/01/2021 5:52:16 PM By: Baruch Gouty RN, BSN Entered By: Baruch Gouty on 01/01/2021 11:50:24 -------------------------------------------------------------------------------- Wound Assessment Details Patient Name: Date of Service: Shane [name], Shane [name] MES H. 01/01/2021 11:00 A M Medical Record  Number: [id number] Patient Account Number: [id number] Date of Birth/Sex: Treating RN: 1941/06/28 (80 y.o. [name] Primary Care Melat Wrisley: Geoffery Lyons Other Clinician: Referring Carmeron Heady: Treating Bijou Easler/Extender: Peter Garter, Constance Goltz in Treatment: 1 Wound Status Wound Number: 1 Primary Diabetic Wound/Ulcer of the Lower Extremity Etiology: Wound Location: Right, Lateral Foot Wound Open Wounding Event: Gradually Appeared Status: Date Acquired: 12/08/2020 Comorbid Sleep Apnea, Arrhythmia, Coronary Artery Disease, Hypertension, Weeks Of Treatment: 1 History: Peripheral Arterial Disease, Type II Diabetes, Neuropathy Clustered Wound: No Wound Measurements Length: (cm) 2.2 Width: (cm) 3 Depth: (cm) 0.1 Area: (cm) 5.184 Volume: (cm) 0.518 % Reduction in Area: -4.8% % Reduction in Volume: -4.6% Epithelialization: None Tunneling: No Undermining: No Wound Description Classification: Grade 2 Wound Margin: Distinct, outline attached Exudate Amount: Medium Exudate Type: Serosanguineous Exudate Color: red, brown Foul Odor After Cleansing: No Slough/Fibrino Yes Wound Bed Granulation Amount: Small (1-33%) Exposed Structure Granulation Quality: Pink, Hyper-granulation Fascia Exposed: No Necrotic Amount: Large (67-100%) Fat Layer (Subcutaneous Tissue) Exposed: No Necrotic Quality: Eschar, Adherent Slough Tendon Exposed: No Muscle Exposed: No Joint Exposed: No Bone Exposed: No Treatment Notes Wound #1 (Foot) Wound Laterality: Right, Lateral Cleanser Soap and Water Discharge Instruction: May shower and wash wound with dial antibacterial soap and water prior to dressing change. Peri-Wound Care Topical Primary Dressing KerraCel Ag Gelling Fiber Dressing, 4x5 in (silver alginate) Discharge Instruction: Apply silver alginate to wound bed , pack lightly into wound Secondary Dressing Woven Gauze Sponge, Non-Sterile 4x4 in Discharge  Instruction: Apply over primary dressing as directed. Secured With Conforming Stretch Gauze Bandage, Sterile 2x75 (in/in) Discharge Instruction: Secure with stretch gauze as directed. Compression Wrap Compression Stockings Add-Ons Electronic Signature(s) Signed: 01/01/2021 5:42:38 PM By: Deon Pilling Signed: 01/01/2021 5:52:16 PM By: Baruch Gouty RN, BSN Entered By: Deon Pilling on 01/01/2021 11:47:59 -------------------------------------------------------------------------------- Wound Assessment Details Patient Name: Date of Service: Shane [name], Shane [name] MES H. 01/01/2021 11:00 A M Medical Record Number: [id number] Patient Account Number: [id number] Date of Birth/Sex: Treating RN: 26-Oct-1941 (80 y.o. [name] Primary Care Nyeshia Mysliwiec: Geoffery Lyons Other Clinician: Referring Cheryal Salas: Treating Royal Vandevoort/Extender: Peter Garter, Constance Goltz in Treatment: 1 Wound Status Wound Number: 2 Primary Pressure Ulcer Etiology: Wound Location: Coccyx Wound Open Wounding Event: Gradually Appeared Status: Date Acquired: 11/13/2020 Comorbid Sleep Apnea, Arrhythmia, Coronary Artery Disease, Hypertension, Weeks Of Treatment: 1 History: Peripheral Arterial Disease, Type II Diabetes, Neuropathy Clustered Wound: No Wound Measurements Length: (cm) 1.1 Width: (cm) 0.3 Depth: (cm)  0.1 Area: (cm) 0.259 Volume: (cm) 0.026 % Reduction in Area: 45% % Reduction in Volume: 72.3% Epithelialization: None Tunneling: No Undermining: No Wound Description Classification: Category/Stage II Wound Margin: Distinct, outline attached Exudate Amount: Medium Exudate Type: Serosanguineous Exudate Color: red, brown Foul Odor After Cleansing: No Slough/Fibrino Yes Wound Bed Granulation Amount: None Present (0%) Exposed Structure Necrotic Amount: Large (67-100%) Fascia Exposed: No Necrotic Quality: Adherent Slough Fat Layer (Subcutaneous Tissue) Exposed: Yes Tendon Exposed:  No Muscle Exposed: No Joint Exposed: No Bone Exposed: No Treatment Notes Wound #2 (Coccyx) Cleanser Peri-Wound Care Topical Primary Dressing Santyl Ointment Discharge Instruction: Apply nickel thick amount to wound bed as instructed Secondary Dressing ComfortFoam Border, 4x4 in (silicone border) Discharge Instruction: Apply over primary dressing as directed. Secured With Compression Wrap Compression Stockings Environmental education officer) Signed: 01/01/2021 5:42:38 PM By: Deon Pilling Signed: 01/01/2021 5:52:16 PM By: Baruch Gouty RN, BSN Entered By: Deon Pilling on 01/01/2021 11:48:22 -------------------------------------------------------------------------------- Wound Assessment Details Patient Name: Date of Service: Shane [name], Shane [name] MES H. 01/01/2021 11:00 A M Medical Record Number: [id number] Patient Account Number: [id number] Date of Birth/Sex: Treating RN: 1941-03-01 (80 y.o. [name] Primary Care Earmon Sherrow: Geoffery Lyons Other Clinician: Referring Kortne All: Treating Ermal Brzozowski/Extender: Peter Garter, Constance Goltz in Treatment: 1 Wound Status Wound Number: 3 Primary Pressure Ulcer Etiology: Wound Location: Left Gluteus Wound Open Wounding Event: Gradually Appeared Status: Date Acquired: 11/13/2020 Comorbid Sleep Apnea, Arrhythmia, Coronary Artery Disease, Hypertension, Weeks Of Treatment: 1 History: Peripheral Arterial Disease, Type II Diabetes, Neuropathy Clustered Wound: No Wound Measurements Length: (cm) 0.3 Width: (cm) 1.6 Depth: (cm) 0.01 Area: (cm) 0.377 Volume: (cm) 0.004 % Reduction in Area: 4.1% % Reduction in Volume: 94.9% Epithelialization: None Tunneling: No Undermining: No Wound Description Classification: Category/Stage II Wound Margin: Distinct, outline attached Exudate Amount: Medium Exudate Type: Serosanguineous Exudate Color: red, brown Foul Odor After Cleansing: No Slough/Fibrino Yes Wound  Bed Granulation Amount: None Present (0%) Exposed Structure Necrotic Amount: Large (67-100%) Fascia Exposed: No Necrotic Quality: Adherent Slough Fat Layer (Subcutaneous Tissue) Exposed: Yes Tendon Exposed: No Muscle Exposed: No Joint Exposed: No Bone Exposed: No Treatment Notes Wound #3 (Gluteus) Wound Laterality: Left Cleanser Peri-Wound Care Topical Primary Dressing Santyl Ointment Discharge Instruction: Apply nickel thick amount to wound bed as instructed Secondary Dressing ComfortFoam Border, 4x4 in (silicone border) Discharge Instruction: Apply over primary dressing as directed. Secured With Compression Wrap Compression Stockings Environmental education officer) Signed: 01/01/2021 5:42:38 PM By: Deon Pilling Signed: 01/01/2021 5:52:16 PM By: Baruch Gouty RN, BSN Entered By: Deon Pilling on 01/01/2021 11:48:39 -------------------------------------------------------------------------------- Wound Assessment Details Patient Name: Date of Service: Shane [name], Shane [name] MES H. 01/01/2021 11:00 A M Medical Record Number: [id number] Patient Account Number: [id number] Date of Birth/Sex: Treating RN: February 03, 1941 (80 y.o. [name] Primary Care Lynnsie Linders: Geoffery Lyons Other Clinician: Referring Ebany Bowermaster: Treating Bera Pinela/Extender: Peter Garter, Constance Goltz in Treatment: 1 Wound Status Wound Number: 4 Primary Open Surgical Wound Etiology: Wound Location: Left, Medial Lower Leg Wound Open Wounding Event: Surgical Injury Status: Date Acquired: 11/04/2020 Comorbid Sleep Apnea, Arrhythmia, Coronary Artery Disease, Hypertension, Weeks Of Treatment: 0 History: Peripheral Arterial Disease, Type II Diabetes, Neuropathy Clustered Wound: No Wound Measurements Length: (cm) 1.1 Width: (cm) 0.5 Depth: (cm) 0.1 Area: (cm) 0.432 Volume: (cm) 0.043 % Reduction in Area: 0% % Reduction in Volume: 0% Epithelialization: None Tunneling: No Undermining:  No Wound Description Classification: Unclassifiable Wound Margin: Distinct, outline attached Exudate Amount: Medium Exudate Type: Serosanguineous Exudate Color: red, brown Foul  Odor After Cleansing: No Slough/Fibrino Yes Wound Bed Granulation Amount: None Present (0%) Exposed Structure Necrotic Amount: Large (67-100%) Fascia Exposed: No Necrotic Quality: Eschar, Adherent Slough Fat Layer (Subcutaneous Tissue) Exposed: No Tendon Exposed: No Muscle Exposed: No Joint Exposed: No Bone Exposed: No Treatment Notes Wound #4 (Lower Leg) Wound Laterality: Left, Medial Cleanser Soap and Water Discharge Instruction: May shower and wash wound with dial antibacterial soap and water prior to dressing change. Peri-Wound Care Topical Mupirocin Ointment Discharge Instruction: Apply Mupirocin (Bactroban) as instructed Primary Dressing Secondary Dressing ComfortFoam Border, 4x4 in (silicone border) Discharge Instruction: Apply over primary dressing as directed. Secured With Compression Wrap Compression Stockings Environmental education officer) Signed: 01/01/2021 5:42:38 PM By: Deon Pilling Signed: 01/01/2021 5:52:16 PM By: Baruch Gouty RN, BSN Entered By: Deon Pilling on 01/01/2021 11:49:09 -------------------------------------------------------------------------------- Vitals Details Patient Name: Date of Service: Shane [name], Shane [name] MES H. 01/01/2021 11:00 A M Medical Record Number: [id number] Patient Account Number: [id number] Date of Birth/Sex: Treating RN: Jul 09, 1941 (80 y.o. [name] Primary Care Ezequiel Macauley: Geoffery Lyons Other Clinician: Referring Zayvier Caravello: Treating Marco Adelson/Extender: Peter Garter, Constance Goltz in Treatment: 1 Vital Signs Time Taken: 11:15 Temperature (F): 98.7 Height (in): 72 Pulse (bpm): 77 Weight (lbs): 256 Respiratory Rate (breaths/min): 17 Body Mass Index (BMI): 34.7 Blood Pressure (mmHg): 137/66 Capillary Blood Glucose  (mg/dl): 96 Reference Range: 80 - 120 mg / dl Electronic Signature(s) Signed: 01/04/2021 11:41:01 AM By: Sandre Kitty Entered By: Sandre Kitty on 01/01/2021 11:16:13

## 2021-01-05 ENCOUNTER — Ambulatory Visit (INDEPENDENT_AMBULATORY_CARE_PROVIDER_SITE_OTHER): Payer: Medicare Other | Admitting: Physician Assistant

## 2021-01-05 ENCOUNTER — Other Ambulatory Visit: Payer: Self-pay

## 2021-01-05 ENCOUNTER — Ambulatory Visit (INDEPENDENT_AMBULATORY_CARE_PROVIDER_SITE_OTHER)
Admission: RE | Admit: 2021-01-05 | Discharge: 2021-01-05 | Disposition: A | Discharge status: Home / Self Care | Payer: Medicare Other | Source: Ambulatory Visit | Attending: Vascular Surgery | Admitting: Vascular Surgery

## 2021-01-05 ENCOUNTER — Ambulatory Visit (HOSPITAL_COMMUNITY)
Admission: RE | Admit: 2021-01-05 | Discharge: 2021-01-05 | Disposition: A | Discharge status: Home / Self Care | Payer: Medicare Other | Source: Ambulatory Visit | Attending: Physician Assistant | Admitting: Physician Assistant

## 2021-01-05 VITALS — BP 139/63 | HR 69 | Temp 98.2°F | Resp 20 | Ht 72.0 in | Wt 250.0 lb

## 2021-01-05 DIAGNOSIS — I739 Peripheral vascular disease, unspecified: Secondary | ICD-10-CM | POA: Insufficient documentation

## 2021-01-05 DIAGNOSIS — L97512 Non-pressure chronic ulcer of other part of right foot with fat layer exposed: Secondary | ICD-10-CM | POA: Diagnosis not present

## 2021-01-05 NOTE — Progress Notes (Signed)
Office Note     CC:  follow up Requesting Provider:  Burnard Bunting, MD  HPI: Shane [name] is a 80 y.o. (07-05-1941) male who presents for follow-up of PAD. He underwent right SFA stenting by [name] on 12/10/2020 due to right foot ulcer. He arrives in wheelchair today. His wife accompanies him. He is receiving wound care at the [facility] and has home health services dressing his wound. He is receiving Ancel IV via RUE PICC through February 21st. He denies fever, chills or skin issues of the left foot.  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    Maintained on apixiban for a. fib  Compliant with aspirin and statin He is diabetic on insulin  Past Medical History:  Diagnosis Date  . 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 Cataract   . Chronic kidney disease    d/t DM  . CKD (chronic kidney disease), stage III ([place])   . Diabetes mellitus    Vgo disposal insulin bolus  simular to insulin pump  . Dyspnea   . GERD (gastroesophageal reflux disease)   . Hyperlipidemia   . Hypertension   . 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 ILD (interstitial lung disease) ([place])   . Moderate aortic stenosis    a. 10/2019 Echo: EF 55-60%, Gr2 DD. Nl RV.   Marland Kitchen Neuromuscular disorder ([place])   . Nonobstructive CAD (coronary artery disease)    a. 2012 Cath: mod, nonobs dzs; b. 10/2016 MV: EF 60%, no ischemia.  . OSA on CPAP 05/05/2017   Unattended Home Sleep Test 7/2/813-AHI 38.6/hour, desaturation to 64%, body weight 261 pounds  . PONV (postoperative nausea and vomiting)   . Sleep apnea     uses cpap asked to bring mask and tubing    Past Surgical History:  Procedure Laterality Date  . ABDOMINAL AORTOGRAM W/LOWER EXTREMITY N/A 12/10/2020   Procedure: ABDOMINAL AORTOGRAM W/LOWER EXTREMITY;  Surgeon: Cherre Robins, MD;  Location: [place] [facility];  Service: Cardiovascular;  Laterality: N/A;  . ANTERIOR FUSION CERVICAL SPINE  2012  . CARDIAC  CATHETERIZATION  2011  . CARDIAC CATHETERIZATION N/A 11/09/2016   Procedure: Right Heart Cath;  Surgeon: Belva Crome, MD;  Location: [place] [facility];  Service: Cardiovascular;  Laterality: N/A;  . carpel tunnel     left wrist  . CATARACT EXTRACTION    . CATARACT EXTRACTION W/ INTRAOCULAR LENS  IMPLANT, BILATERAL  2013  . CERVICAL LAMINECTOMY  2012  . COLONOSCOPY N/A 01/14/2013   Procedure: COLONOSCOPY;  Surgeon: Irene Shipper, MD;  Location: [facility];  Service: Endoscopy;  Laterality: N/A;  . CORONARY ARTERY BYPASS GRAFT  11/04/2020   LIMA-LAD, SVG-OM1, SVG-PDA (Dr Marney [facility]) dc 11/18/2020  . EYE SURGERY    . [place]   left  . LEFT HEART CATH AND CORONARY ANGIOGRAPHY N/A 07/10/2020   Procedure: LEFT HEART CATH AND CORONARY ANGIOGRAPHY;  Surgeon: Sherren Mocha, MD;  Location: [place] [facility];  Service: Cardiovascular;  Laterality: N/A;  . LUMBAR LAMINECTOMY  2003  . LUNG BIOPSY Left 12/26/2016   Procedure: LUNG BIOPSY;  Surgeon: [name], MD;  Location: [place];  Service: Thoracic;  Laterality: Left;  . PACEMAKER IMPLANT N/A 03/30/2020   Procedure: PACEMAKER IMPLANT;  Surgeon: Evans Lance, MD;  Location: [place] [facility];  Service: Cardiovascular;  Laterality: N/A;  . PERIPHERAL VASCULAR INTERVENTION Right 12/10/2020  Procedure: PERIPHERAL VASCULAR INTERVENTION;  Surgeon: Cherre Robins, MD;  Location: [place] [facility];  Service: Cardiovascular;  Laterality: Right;  SFA  . POSTERIOR FUSION CERVICAL SPINE  2012  . PPM GENERATOR REMOVAL N/A 12/14/2020   Procedure: PPM GENERATOR REMOVAL;  Surgeon: Evans Lance, MD;  Location: [place] [facility];  Service: Cardiovascular;  Laterality: N/A;  . TEE WITHOUT CARDIOVERSION N/A 12/11/2020   Procedure: TRANSESOPHAGEAL ECHOCARDIOGRAM (TEE);  Surgeon: Geralynn Rile, MD;  Location: [place];  Service: Cardiovascular;  Laterality: N/A;  . TRIGGER FINGER RELEASE  2011   4th finger left hand  .  VIDEO ASSISTED THORACOSCOPY Left 12/26/2016   Procedure: VIDEO ASSISTED THORACOSCOPY;  Surgeon: [name], MD;  Location: [place];  Service: Thoracic;  Laterality: Left;  Marland Kitchen VIDEO BRONCHOSCOPY N/A 12/26/2016   Procedure: VIDEO BRONCHOSCOPY;  Surgeon: [name], MD;  Location: [place];  Service: Thoracic;  Laterality: N/A;    Social History   Socioeconomic History  . Marital status: Married    Spouse name: Not on file  . Number of children: Not on file  . Years of education: Not on file  . Highest education level: Not on file  Occupational History  . Not on file  Tobacco Use  . Smoking status: Never Smoker  . Smokeless tobacco: Never Used  Vaping Use  . Vaping Use: Never used  Substance and Sexual Activity  . Alcohol use: No  . Drug use: No  . Sexual activity: Not Currently  Other Topics Concern  . 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    Social Determinants of Health   Financial Resource Strain: Not on file  Food Insecurity: Not on file  Transportation Needs: Not on file  Physical Activity: Not on file  Stress: Not on file  Social Connections: Not on file  Intimate Partner Violence: Not on file   Family History  Problem Relation Age of Onset  . Diabetes Mellitus II Mother   . Emphysema Father 55  . Heart attack Father   . Colon cancer Neg Hx   . Esophageal cancer Neg Hx   . Rectal cancer Neg Hx   . Stomach cancer Neg Hx     Current Outpatient Medications  Medication Sig Dispense Refill  . albuterol (VENTOLIN HFA) 108 (90 Base) MCG/ACT inhaler Inhale 2 puffs into the lungs every 4 (four) hours as needed for wheezing or shortness of breath.    Marland Kitchen apixaban (ELIQUIS) 5 MG TABS tablet Take 1 tablet (5 mg total) by mouth 2 (two) times daily. 60 tablet 11  . Ascorbic Acid (VITAMIN C WITH ROSE HIPS) 500 MG tablet Take 500 mg by mouth daily.    Marland Kitchen aspirin EC 81 MG tablet Take 81 mg by mouth daily.    Marland Kitchen atorvastatin (LIPITOR)  40 MG tablet Take 40 mg by mouth at bedtime.    Marland Kitchen ceFAZolin (ANCEF) IVPB Inject 2 g into the vein every 8 (eight) hours. Indication:  Endocarditis First Dose: Yes Last Day of Therapy:  01/25/21 Labs - Once weekly:  CBC/D and BMP, Labs - Every other week:  ESR and CRP Method of administration: IV Push Method of administration may be changed at the discretion of home infusion pharmacist based upon assessment of the patient and/or caregiver's ability to self-administer the medication ordered. 123 Units 0  . clotrimazole-betamethasone (LOTRISONE) cream Apply 1 application topically daily as needed (for skin infection).  0  . Continuous Blood Gluc Sensor (FREESTYLE LIBRE 14 DAY SENSOR) MISC 1 patch every 14 (fourteen) days.    . CVS ACETAMINOPHEN 325 MG tablet Take 650 mg by mouth daily as needed for pain.    . Ensure Max Protein (ENSURE MAX PROTEIN) LIQD Take 330 mLs (11 oz total) by mouth 2 (two) times daily. 3330 mL 0  . EPINEPHrine 0.3 mg/0.3 mL IJ SOAJ injection Inject 0.3 mg into the muscle once as needed for anaphylaxis (max 3 doses).  0  . furosemide (LASIX) 40 MG tablet Take 40 mg by mouth daily.    . insulin lispro (HUMALOG) 100 UNIT/ML injection Inject 0.07 mLs (7 Units total) into the skin 3 (three) times daily with meals. 10 mL 0  . liver oil-zinc oxide (DESITIN) 40 % ointment Apply 1 application topically in the morning and at bedtime.    Marland Kitchen LORazepam (ATIVAN) 1 MG tablet Take 1 mg by mouth at bedtime as needed for sleep.     Marland Kitchen losartan (COZAAR) 50 MG tablet Take 50 mg by mouth daily.    . magnesium oxide (MAG-OX) 400 MG tablet TAKE 1 CAPSULE BY MOUTH ONCE DAILY (Patient taking differently: Take 400 mg by mouth daily.) 90 tablet 1  . metoprolol tartrate (LOPRESSOR) 50 MG tablet Take 25 mg by mouth 2 (two) times daily.    . Multiple Vitamin (MULTIVITAMIN WITH MINERALS) TABS tablet Take 1 tablet by mouth daily.    . mupirocin ointment (BACTROBAN) 2 % SMARTSIG:1 Application Topical 2-3  Times Daily    . pantoprazole (PROTONIX) 40 MG tablet Take 40 mg by mouth daily.    . polyethylene glycol powder (GLYCOLAX/MIRALAX) 17 GM/SCOOP powder Take 17 g by mouth daily.    . potassium chloride SA (KLOR-CON) 20 MEQ tablet Take 20-40 mEq by mouth See admin instructions. Takes 2 tablets (40 mEq totally) by mouth for 7 days; then takes 1 tablet (20 mEq totally) by mouth daily after that    . Promethazine-Phenyleph-Codeine (PROMETHAZINE VC/CODEINE) 6.25-5-10 MG/5ML SYRP     . SANTYL ointment Apply topically.    Tyler Aas FLEXTOUCH 200 UNIT/ML FlexTouch Pen Inject 16 Units into the skin at bedtime.     No current facility-administered medications for this visit.    Allergies  Allergen Reactions  . Codeine Hives and Itching  . Ofev [Nintedanib] Diarrhea    SEVERE DIARRHEA  . Other Diarrhea and Other (See Comments)    Esbriet causes elevated LFTs. D/C on 06/14/17 and SEVERE DIARRHEA     REVIEW OF SYSTEMS:   '[X]'$  denotes positive finding, $RemoveBeforeDEI'[ ]'[id number]$  denotes negative finding Cardiac  Comments:  Chest pain or chest pressure:    Shortness of breath upon exertion:    Short of breath when lying flat:    Irregular heart rhythm:        Vascular    Pain in calf, thigh, or hip brought on by ambulation:    Pain in feet at night that wakes you up from your sleep:     Blood clot in your veins:    Leg swelling:         Pulmonary    Oxygen at home:    Productive cough:     Wheezing:         Neurologic    Sudden weakness in arms or legs:     Sudden numbness in arms or legs:     Sudden onset of difficulty speaking or slurred speech:    Temporary loss  of vision in one eye:     Problems with dizziness:         Gastrointestinal    Blood in stool:     Vomited blood:         Genitourinary    Burning when urinating:     Blood in urine:        Psychiatric    Major depression:         Hematologic    Bleeding problems:    Problems with blood clotting too easily:        Skin    Rashes or  ulcers:        Constitutional    Fever or chills:      PHYSICAL EXAMINATION: Vitals:   01/05/21 1203  BP: 139/63  Pulse: 69  Resp: 20  Temp: 98.2 F (36.8 C)  SpO2: 99%    General:  WDWN in NAD; vital signs documented above Gait: Not observed HENT: WNL, normocephalic Pulmonary: normal non-labored breathing , without Rales, rhonchi,  wheezing Cardiac: regular HR Skin: with anterior R lower leg skin rash Vascular Exam/Pulses: Brisk, biphasic right DP and PT Doppler signals, monophasic right peroneal signal Extremities:  without Gangrene , mild cellulitis of right foot; with open wounds of lateral aspect of head of 5th metatarsal. Mild to moderate right lower leg and foot edema and erythema Musculoskeletal: no muscle wasting or atrophy  Neurologic: A&O X 3;  No focal weakness or paresthesias are detected Psychiatric:  The pt has Normal affect.     Non-Invasive Vascular Imaging:   01/05/2021 ABI/TBIToday's ABIToday's TBI Previous ABIPrevious TBI  +-------+-----------+------------+------------+------------+  Right [place]     not obtained              +-------+-----------+------------+------------+------------+  Left  [place]     0.58                   Right toe pressure not obtained Left toe pressure 80 RLE duplex: Right: Normal examination. Patent stent with no visualized stenosis. Biphasic and triphasic waveforms.   ASSESSMENT/PLAN:: 80 y.o. male here for follow up for PAD with right SFA stenting.  His stent is patent.  ABIs are noncompressible.  There is eschar overlying his right lateral foot ulcer but appears improved as compared to 1 month ago.  He is continuing IV antibiotics for right foot cellulitis.  Ongoing wound care with Dr. Asa Saunas.  Follow-up in 6 months with repeat ABIs and duplex examination.  Call our office sooner for evaluation should he develop lower extremity pain or further skin issues with lower  extremities.    Barbie Banner, PA-C [facility] [phone]  Clinic MD:   Stanford Breed

## 2021-01-06 ENCOUNTER — Other Ambulatory Visit: Payer: Self-pay

## 2021-01-06 ENCOUNTER — Encounter: Payer: Medicare Other | Admitting: Internal Medicine

## 2021-01-06 DIAGNOSIS — I739 Peripheral vascular disease, unspecified: Secondary | ICD-10-CM

## 2021-01-07 ENCOUNTER — Other Ambulatory Visit (HOSPITAL_COMMUNITY): Payer: Self-pay | Admitting: Internal Medicine

## 2021-01-07 ENCOUNTER — Ambulatory Visit (HOSPITAL_COMMUNITY)
Admission: RE | Admit: 2021-01-07 | Discharge: 2021-01-07 | Disposition: A | Discharge status: Home / Self Care | Payer: Medicare Other | Source: Ambulatory Visit | Attending: Internal Medicine | Admitting: Internal Medicine

## 2021-01-07 ENCOUNTER — Other Ambulatory Visit: Payer: Self-pay

## 2021-01-07 DIAGNOSIS — G459 Transient cerebral ischemic attack, unspecified: Secondary | ICD-10-CM | POA: Insufficient documentation

## 2021-01-07 LAB — AEROBIC/ANAEROBIC CULTURE W GRAM STAIN (SURGICAL/DEEP WOUND): Gram Stain: NONE SEEN

## 2021-01-08 ENCOUNTER — Other Ambulatory Visit (HOSPITAL_COMMUNITY): Payer: Self-pay | Admitting: Internal Medicine

## 2021-01-08 ENCOUNTER — Other Ambulatory Visit: Payer: Self-pay | Admitting: Internal Medicine

## 2021-01-08 ENCOUNTER — Encounter (HOSPITAL_BASED_OUTPATIENT_CLINIC_OR_DEPARTMENT_OTHER): Payer: Medicare Other | Attending: Internal Medicine | Admitting: Internal Medicine

## 2021-01-08 DIAGNOSIS — E1142 Type 2 diabetes mellitus with diabetic polyneuropathy: Secondary | ICD-10-CM | POA: Diagnosis not present

## 2021-01-08 DIAGNOSIS — L97518 Non-pressure chronic ulcer of other part of right foot with other specified severity: Secondary | ICD-10-CM | POA: Diagnosis not present

## 2021-01-08 DIAGNOSIS — L89152 Pressure ulcer of sacral region, stage 2: Secondary | ICD-10-CM | POA: Diagnosis not present

## 2021-01-08 DIAGNOSIS — L97821 Non-pressure chronic ulcer of other part of left lower leg limited to breakdown of skin: Secondary | ICD-10-CM | POA: Insufficient documentation

## 2021-01-08 DIAGNOSIS — Z951 Presence of aortocoronary bypass graft: Secondary | ICD-10-CM | POA: Insufficient documentation

## 2021-01-08 DIAGNOSIS — I2510 Atherosclerotic heart disease of native coronary artery without angina pectoris: Secondary | ICD-10-CM | POA: Diagnosis not present

## 2021-01-08 DIAGNOSIS — I110 Hypertensive heart disease with heart failure: Secondary | ICD-10-CM | POA: Diagnosis not present

## 2021-01-08 DIAGNOSIS — I509 Heart failure, unspecified: Secondary | ICD-10-CM | POA: Insufficient documentation

## 2021-01-08 DIAGNOSIS — L89322 Pressure ulcer of left buttock, stage 2: Secondary | ICD-10-CM | POA: Diagnosis not present

## 2021-01-08 DIAGNOSIS — E1151 Type 2 diabetes mellitus with diabetic peripheral angiopathy without gangrene: Secondary | ICD-10-CM | POA: Diagnosis not present

## 2021-01-08 DIAGNOSIS — B9561 Methicillin susceptible Staphylococcus aureus infection as the cause of diseases classified elsewhere: Secondary | ICD-10-CM | POA: Diagnosis not present

## 2021-01-08 DIAGNOSIS — E11621 Type 2 diabetes mellitus with foot ulcer: Secondary | ICD-10-CM

## 2021-01-08 DIAGNOSIS — L03115 Cellulitis of right lower limb: Secondary | ICD-10-CM | POA: Diagnosis not present

## 2021-01-08 NOTE — Progress Notes (Signed)
BYARD, CARRANZA ([id number]) Visit Report for 01/08/2021 HPI Details Patient Name: Date of Service: Shane [name] MES H. 01/08/2021 10:00 [place] Record Number: [id number] Patient Account Number: [id number] Date of Birth/Sex: Treating RN: January 03, 1941 (80 y.o. [name] Primary Care Provider: Geoffery Lyons Other Clinician: Referring Provider: Treating Provider/Extender: Peter Garter, Constance Goltz in Treatment: 2 History of Present Illness HPI Description: ADMISSION 12/25/2020 This is a 80 year old man who had an elective admission to [place] in early December for coronary artery disease he underwent a CABG x3. He spent in the morning amount of time in the hospital 15 days according to his wife in 10 days in the ICU but I have not researched this issue. When he left [facility] he apparently had the pressure ulcers on the coccyx and the gluteal area. He was at home for a short period of time however he required readmission to [facility] from 12/08/2020 through 12/16/2020 with increasing fatigue dyspnea fever and cough. 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 The patient is currently on Ancef. During the hospitalization he was discovered to have a low ABI of 0.62 on the right. He went for an angiogram on 12/10/2020 and the patient had stents of the SFA and popliteal lesions. At that time there was complete resolution of the stenosis/occlusion. He had a CT scan of the foot that did not show evidence of abscess or osteomyelitis. The patient is still on Ancef follows up with infectious disease next week he has a follow-up with Dr. Jerry Caras of vein and vascular on February 8. He does not complain of pain in his foot however he does have significant diabetic neuropathy. He is using Xeroform to all 3 wounds Past medical history includes type 2 diabetes, peripheral  neuropathy, paroxysmal atrial fibrillation with heart blocko Sick sinus syndrome, idiopathic pulmonary fibrosis, congestive heart failure, hypertension witho Postural hypotension, recent CABG at [facility] on 11/04/2020. He no longer has a pacemaker but has had previous cervical spine surgery with hardware at this level We did not redo his ABI in clinic today 1/28; this is a patient I admitted to the clinic last week he had exceptionally complex hospitalizations noted above firstly at [place]. 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 At some point in this he developed a necrotic wound over the right fifth metatarsal head. He underwent revascularization of the left SFA and popliteal with stents. He is still on Ancef. I was concerned about the condition of the left foot wound last time specifically the erythema extending into the forefoot however the erythema looks a lot better today. He saw Dr. Juleen China of infectious disease since he was last here and Dr. Juleen China communicated with me wondering whether we required an MRI given the fact that he had a CT scan of his foot that was negative for osteomyelitis. It is really a moot point since he has hardware in his neck from previous surgery looked at CT scans today dating back from 2019 of his C-spine. In any case I am still very concerned about the necrotic nature of the wound on the left foot. He has small areas on his coccyx left buttock. There was a new area that they discovered on the weekend a small area on the left anterior upper lower leg which apparently was a vein harvest site although I see  very little in terms of the scar. This apparently open spontaneously. As his wife works for [employer] somebody was good enough to call in some Bactroban for her and they are using this on this area 2/4; the patient was seen in follow-up by vascular this week. His ABIs were noncompressible on the right although the waveforms were  monophasic at the posterior tibial biphasic at the dorsalis pedis. They did not do a toe pressure. Noncompressible on the left also but with a TBI of 0.58 and a toe pressure of 80. Somewhat better than I was expecting. He is going to have follow-up studies in 6 months He has a completely necrotic wound over the right fifth metatarsal head. Culture of this that I did last time showed abundant Serratia and rare Enterococcus. I spoke with Dr. Juleen China by secure text message he recommended quinolones I am going to start him on Levaquin 750 mg today, sees Dr. Juleen China on Monday. Electronic Signature(s) Signed: 01/08/2021 4:22:00 PM By: Linton Ham MD Entered By: Linton Ham on 01/08/2021 11:27:19 -------------------------------------------------------------------------------- Physical Exam Details Patient Name: Date of Service: Shane [name], [name] MES H. 01/08/2021 10:00 A M Medical Record Number: [id number] Patient Account Number: [id number] Date of Birth/Sex: Treating RN: 1941-09-27 (80 y.o. [name] Primary Care Provider: Geoffery Lyons Other Clinician: Referring Provider: Treating Provider/Extender: Peter Garter, Constance Goltz in Treatment: 2 Constitutional Sitting or standing Blood Pressure is within target range for patient.. Pulse regular and within target range for patient.Marland Kitchen Respirations regular, non-labored and within target range.. Temperature is normal and within the target range for the patient.Marland Kitchen Appears in no distress. Notes Wound exam; She has small wound stage II pressure areas in the lower coccyx and buttocks these are progressing towards closure using Santyl. No debridement is necessary The vein harvest site on the left medial calf also looks close to closure. The more concerning area continues to be the area on the right lateral fifth metatarsal head I debrided this last week there was nothing but purulent drainage necrotic tissue. Comes back in today with  black eschar over the surface probably necrotic subcutaneous fat I did not debride this. There is some surrounding erythema but no palpable tenderness Electronic Signature(s) Signed: 01/08/2021 4:22:00 PM By: Linton Ham MD Entered By: Linton Ham on 01/08/2021 11:21:44 -------------------------------------------------------------------------------- Physician Orders Details Patient Name: Date of Service: Shane [name], [place]. 01/08/2021 10:00 A M Medical Record Number: [id number] Patient Account Number: [id number] Date of Birth/Sex: Treating RN: [birth year] (80 y.o. [name] Primary Care Provider: Geoffery Lyons Other Clinician: Referring Provider: Treating Provider/Extender: Peter Garter, Constance Goltz in Treatment: 2 Verbal / Phone Orders: No Diagnosis Coding ICD-10 Coding Code Description E11.621 Type 2 diabetes mellitus with foot ulcer E11.51 Type 2 diabetes mellitus with diabetic peripheral angiopathy without gangrene L97.518 Non-pressure chronic ulcer of other part of right foot with other specified severity L89.152 Pressure ulcer of sacral region, stage 2 L89.322 Pressure ulcer of left buttock, stage 2 A49.01 Methicillin susceptible Staphylococcus aureus infection, unspecified site L97.821 Non-pressure chronic ulcer of other part of left lower leg limited to breakdown of skin Follow-up Appointments Return Appointment in 1 week. Bathing/ Shower/ Hygiene May shower and wash wound with soap and water. - on days when dressings are changed Edema Control - Lymphedema / SCD / Other Elevate legs to the level of the heart or above for 30 minutes daily and/or when sitting, a frequency of: Avoid standing for long periods of  time. Exercise regularly Moisturize legs daily. Off-Loading Open toe surgical shoe to: - right foot Turn and reposition every 2 hours - avoid slouching in chair when sitting, weight should be on back of thighs Home Health No change in  wound care orders this week; continue Home Health for wound care. May utilize formulary equivalent dressing for wound treatment orders unless otherwise specified. Other Home Health Orders/Instructions: - [facility] Wound Treatment Wound #1 - Foot Wound Laterality: Right, Lateral Cleanser: Soap and Water Every Other Day/30 Days Discharge Instructions: May shower and wash wound with dial antibacterial soap and water prior to dressing change. Prim Dressing: KerraCel Ag Gelling Fiber Dressing, 4x5 in (silver alginate) (Home Health) Every Other Day/30 Days ary Discharge Instructions: Apply silver alginate to wound bed , pack lightly into wound Secondary Dressing: Woven Gauze Sponge, Non-Sterile 4x4 in [facility]) Every Other Day/30 Days Discharge Instructions: Apply over primary dressing as directed. Secured With: Child psychotherapist, Sterile 2x75 (in/in) (Home Health) Every Other Day/30 Days Discharge Instructions: Secure with stretch gauze as directed. Wound #2 - Coccyx Prim Dressing: Santyl Ointment (Home Health) 1 x Per Day/30 Days ary Discharge Instructions: Apply nickel thick amount to wound bed as instructed Secondary Dressing: ComfortFoam Border, 4x4 in (silicone border) ([place]) 1 x Per Day/30 Days Discharge Instructions: Apply over primary dressing as directed. Wound #3 - Gluteus Wound Laterality: Left Prim Dressing: Santyl Ointment [facility]) Every Other Day/30 Days ary Discharge Instructions: Apply nickel thick amount to wound bed as instructed Secondary Dressing: ComfortFoam Border, 4x4 in (silicone border) [facility]) Every Other Day/30 Days Discharge Instructions: Apply over primary dressing as directed. Wound #4 - Lower Leg Wound Laterality: Left, Medial Cleanser: Soap and Water 1 x Per Day/30 Days Discharge Instructions: May shower and wash wound with dial antibacterial soap and water prior to dressing change. Topical: Mupirocin Ointment (Home Health) 1 x  Per Day/30 Days Discharge Instructions: Apply Mupirocin (Bactroban) as instructed Secondary Dressing: ComfortFoam Border, 4x4 in (silicone border) ([place]) 1 x Per Day/30 Days Discharge Instructions: Apply over primary dressing as directed. Radiology MRI, lower extremity with/without contrast right foot - diabetic foot ulcer right lateral 5th metatarsal head, r/o osteomyelitis - (ICD10 E11.621 - Type 2 diabetes mellitus with foot ulcer) Patient Medications llergies: codeine A Notifications Medication Indication Start End wound infection right 01/08/2021 Levaquin foot DOSE oral 750 mg tablet - 1 tablet oral daily Electronic Signature(s) Signed: 01/08/2021 11:06:15 AM By: Linton Ham MD Entered By: Linton Ham on 01/08/2021 11:06:14 Prescription 01/08/2021 -------------------------------------------------------------------------------- Anastasia Fiedler MD Patient Name: Provider: 12-Feb-1941 [id number] Date of Birth: NPI#: [id number] Sex: DEA #: [phone] [id number] Phone #: License #: [place] Patient Address: [phone], [place] [address] [place], [place] [address] [phone] Allergies codeine Provider's Orders MRI, lower extremity with/without contrast right foot - ICD10: E11.621 - diabetic foot ulcer right lateral 5th metatarsal head, r/o osteomyelitis Hand Signature: Date(s): Electronic Signature(s) Signed: 01/08/2021 4:22:00 PM By: Linton Ham MD Entered By: Linton Ham on 01/08/2021 11:06:15 -------------------------------------------------------------------------------- Problem List Details Patient Name: Date of Service: Shane [name], [name] MES H. 01/08/2021 10:00 A M Medical Record Number: [id number] Patient Account Number: [id number] Date of Birth/Sex: Treating RN: [birth year] (80 y.o. [name] Primary Care Provider: Geoffery Lyons Other  Clinician: Referring Provider: Treating Provider/Extender: Peter Garter, Constance Goltz in Treatment: 2 Active Problems ICD-10 Encounter Code Description Active Date MDM  Diagnosis E11.621 Type 2 diabetes mellitus with foot ulcer 12/25/2020 No Yes E11.51 Type 2 diabetes mellitus with diabetic peripheral angiopathy without gangrene 12/25/2020 No Yes L97.518 Non-pressure chronic ulcer of other part of right foot with other specified 12/25/2020 No Yes severity L89.152 Pressure ulcer of sacral region, stage 2 12/25/2020 No Yes L89.322 Pressure ulcer of left buttock, stage 2 12/25/2020 No Yes A49.01 Methicillin susceptible Staphylococcus aureus infection, unspecified site 12/25/2020 No Yes L97.821 Non-pressure chronic ulcer of other part of left lower leg limited to breakdown 01/01/2021 No Yes of skin L03.115 Cellulitis of right lower limb 01/08/2021 No Yes Inactive Problems Resolved Problems Electronic Signature(s) Signed: 01/08/2021 4:22:00 PM By: Linton Ham MD Entered By: Linton Ham on 01/08/2021 11:03:23 -------------------------------------------------------------------------------- Progress Note Details Patient Name: Date of Service: Shane [name], [name] MES H. 01/08/2021 10:00 A M Medical Record Number: [id number] Patient Account Number: [id number] Date of Birth/Sex: Treating RN: 03/17/1941 (80 y.o. [name] Primary Care Provider: Geoffery Lyons Other Clinician: Referring Provider: Treating Provider/Extender: Peter Garter, Constance Goltz in Treatment: 2 Subjective History of Present Illness (HPI) ADMISSION 12/25/2020 This is a 80 year old man who had an elective admission to [place] in early December for coronary artery disease he underwent a CABG x3. He spent in the morning amount of time in the hospital 15 days according to his wife in 10 days in the ICU but I have not researched this issue. When he left [facility] he apparently had the pressure ulcers on  the coccyx and the gluteal area. He was at home for a short period of time however he required readmission to [facility] from 12/08/2020 through 12/16/2020 with increasing fatigue dyspnea fever and cough. 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 The patient is currently on Ancef. During the hospitalization he was discovered to have a low ABI of 0.62 on the right. He went for an angiogram on 12/10/2020 and the patient had stents of the SFA and popliteal lesions. At that time there was complete resolution of the stenosis/occlusion. He had a CT scan of the foot that did not show evidence of abscess or osteomyelitis. The patient is still on Ancef follows up with infectious disease next week he has a follow-up with Dr. Jerry Caras of vein and vascular on February 8. He does not complain of pain in his foot however he does have significant diabetic neuropathy. He is using Xeroform to all 3 wounds Past medical history includes type 2 diabetes, peripheral neuropathy, paroxysmal atrial fibrillation with heart blocko Sick sinus syndrome, idiopathic pulmonary fibrosis, congestive heart failure, hypertension witho Postural hypotension, recent CABG at [facility] on 11/04/2020. He no longer has a pacemaker but has had previous cervical spine surgery with hardware at this level We did not redo his ABI in clinic today 1/28; this is a patient I admitted to the clinic last week he had exceptionally complex hospitalizations noted above firstly at [place]. 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 At some point in this he developed a necrotic wound over the right fifth metatarsal head. He underwent revascularization of the left SFA and popliteal with stents. He is still on Ancef. I was concerned about the condition of the left foot wound last time specifically the erythema extending  into the forefoot however the erythema looks a lot better today. He saw Dr. Juleen China of infectious disease since  he was last here and Dr. Juleen China communicated with me wondering whether we required an MRI given the fact that he had a CT scan of his foot that was negative for osteomyelitis. It is really a moot point since he has hardware in his neck from previous surgery looked at CT scans today dating back from 2019 of his C-spine. In any case I am still very concerned about the necrotic nature of the wound on the left foot. He has small areas on his coccyx left buttock. There was a new area that they discovered on the weekend a small area on the left anterior upper lower leg which apparently was a vein harvest site although I see very little in terms of the scar. This apparently open spontaneously. As his wife works for [employer] somebody was good enough to call in some Bactroban for her and they are using this on this area 2/4; the patient was seen in follow-up by vascular this week. His ABI on the left was 0.58, nothing seems to have been repeated on the right although I will check that. not sure why they would have not looked at that. He has a completely necrotic wound over the right fifth metatarsal head. Culture of this that I did last time showed abundant Serratia and rare Enterococcus. I spoke with Dr. Juleen China by secure text message he recommended quinolones I am going to start him on Levaquin 750 mg today, sees Dr. Juleen China on Monday. Objective Constitutional Sitting or standing Blood Pressure is within target range for patient.. Pulse regular and within target range for patient.Marland Kitchen Respirations regular, non-labored and within target range.. Temperature is normal and within the target range for the patient.Marland Kitchen Appears in no distress. Vitals Time Taken: 10:14 AM, Height: 72 in, Weight: 256 lbs, BMI: 34.7, Temperature: 97.5 F, Pulse: 65 bpm, Respiratory Rate: 17 breaths/min, Blood Pressure:  109/64 mmHg, Capillary Blood Glucose: 109 mg/dl. General Notes: Wound exam; ooShe has small wound stage II pressure areas in the lower coccyx and buttocks these are progressing towards closure using Santyl. No debridement is necessary ooThe vein harvest site on the left medial calf also looks close to closure. The more concerning area continues to be the area on the right lateral fifth metatarsal head I debrided this last week there was nothing but purulent drainage necrotic tissue. Comes back in today with black eschar over the surface probably necrotic subcutaneous fat I did not debride this. There is some surrounding erythema but no palpable tenderness Integumentary (Hair, Skin) Wound #1 status is Open. Original cause of wound was Gradually Appeared. The wound is located on the Right,Lateral Foot. The wound measures 2.5cm length x 2cm width x 1.5cm depth; 3.927cm^2 area and 5.89cm^3 volume. There is Fat Layer (Subcutaneous Tissue) exposed. There is no tunneling or undermining noted. There is a medium amount of serosanguineous drainage noted. The wound margin is distinct with the outline attached to the wound base. There is small (1-33%) pink granulation within the wound bed. There is a large (67-100%) amount of necrotic tissue within the wound bed including Eschar and Adherent Slough. Wound #2 status is Open. Original cause of wound was Gradually Appeared. The wound is located on the Coccyx. The wound measures 0.8cm length x 0.3cm width x 0.1cm depth; 0.188cm^2 area and 0.019cm^3 volume. There is Fat Layer (Subcutaneous Tissue) exposed. There is no tunneling or undermining noted. There is a small amount of serosanguineous drainage noted. The wound margin is flat and intact. There is small (  1-33%) pink granulation within the wound bed. There is a large (67-100%) amount of necrotic tissue within the wound bed including Adherent Slough. Wound #3 status is Open. Original cause of wound was Gradually  Appeared. The wound is located on the Left Gluteus. The wound measures 0.3cm length x 0.4cm width x 0.1cm depth; 0.094cm^2 area and 0.009cm^3 volume. There is Fat Layer (Subcutaneous Tissue) exposed. There is no tunneling or undermining noted. There is a small amount of serosanguineous drainage noted. The wound margin is flat and intact. There is large (67-100%) pink granulation within the wound bed. There is no necrotic tissue within the wound bed. Wound #4 status is Open. Original cause of wound was Surgical Injury. The wound is located on the Left,Medial Lower Leg. The wound measures 1cm length x 0.2cm width x 0.1cm depth; 0.157cm^2 area and 0.016cm^3 volume. There is Fat Layer (Subcutaneous Tissue) exposed. There is no tunneling or undermining noted. There is a medium amount of serosanguineous drainage noted. The wound margin is distinct with the outline attached to the wound base. There is small (1-33%) pink granulation within the wound bed. There is a large (67-100%) amount of necrotic tissue within the wound bed including Adherent Slough. Assessment Active Problems ICD-10 Type 2 diabetes mellitus with foot ulcer Type 2 diabetes mellitus with diabetic peripheral angiopathy without gangrene Non-pressure chronic ulcer of other part of right foot with other specified severity Pressure ulcer of sacral region, stage 2 Pressure ulcer of left buttock, stage 2 Methicillin susceptible Staphylococcus aureus infection, unspecified site Non-pressure chronic ulcer of other part of left lower leg limited to breakdown of skin Cellulitis of right lower limb Plan Follow-up Appointments: Return Appointment in 1 week. Bathing/ Shower/ Hygiene: May shower and wash wound with soap and water. - on days when dressings are changed Edema Control - Lymphedema / SCD / Other: Elevate legs to the level of the heart or above for 30 minutes daily and/or when sitting, a frequency of: Avoid standing for long periods  of time. Exercise regularly Moisturize legs daily. Off-Loading: Open toe surgical shoe to: - right foot Turn and reposition every 2 hours - avoid slouching in chair when sitting, weight should be on back of thighs Home Health: No change in wound care orders this week; continue Home Health for wound care. May utilize formulary equivalent dressing for wound treatment orders unless otherwise specified. Other Home Health Orders/Instructions: - [facility] Radiology ordered were: MRI, lower extremity with/without contrast right foot - diabetic foot ulcer right lateral 5th metatarsal head, r/o osteomyelitis The following medication(s) was prescribed: Levaquin oral 750 mg tablet 1 tablet oral daily for wound infection right foot starting 01/08/2021 WOUND #1: - Foot Wound Laterality: Right, Lateral Cleanser: Soap and Water Every Other Day/30 Days Discharge Instructions: May shower and wash wound with dial antibacterial soap and water prior to dressing change. Prim Dressing: KerraCel Ag Gelling Fiber Dressing, 4x5 in (silver alginate) (Home Health) Every Other Day/30 Days ary Discharge Instructions: Apply silver alginate to wound bed , pack lightly into wound Secondary Dressing: Woven Gauze Sponge, Non-Sterile 4x4 in [facility]) Every Other Day/30 Days Discharge Instructions: Apply over primary dressing as directed. Secured With: Child psychotherapist, Sterile 2x75 (in/in) (Home Health) Every Other Day/30 Days Discharge Instructions: Secure with stretch gauze as directed. WOUND #2: - Coccyx Wound Laterality: Prim Dressing: Santyl Ointment ([place]) 1 x Per Day/30 Days ary Discharge Instructions: Apply nickel thick amount to wound bed as instructed Secondary Dressing: ComfortFoam Border, 4x4 in (  silicone border) (Home Health) 1 x Per Day/30 Days Discharge Instructions: Apply over primary dressing as directed. WOUND #3: - Gluteus Wound Laterality: Left Prim Dressing: Santyl Ointment  [facility]) Every Other Day/30 Days ary Discharge Instructions: Apply nickel thick amount to wound bed as instructed Secondary Dressing: ComfortFoam Border, 4x4 in (silicone border) [facility]) Every Other Day/30 Days Discharge Instructions: Apply over primary dressing as directed. WOUND #4: - Lower Leg Wound Laterality: Left, Medial Cleanser: Soap and Water 1 x Per Day/30 Days Discharge Instructions: May shower and wash wound with dial antibacterial soap and water prior to dressing change. Topical: Mupirocin Ointment (Home Health) 1 x Per Day/30 Days Discharge Instructions: Apply Mupirocin (Bactroban) as instructed Secondary Dressing: ComfortFoam Border, 4x4 in (silicone border) ([place]) 1 x Per Day/30 Days Discharge Instructions: Apply over primary dressing as directed. 1. I started him on Levaquin 750 mg a day. I would certainly be glad of Dr. Alcario Drought suggestions 2. Follow-up arterial studies on the right showed a noncompressible ABI with monophasic but brisk waveforms at the PTA and biphasic at the dorsalis pedis. They did not do a great toe pressure. On the left again noncompressible at both the PTA and dorsalis pedis monophasic but brisk at the posterior tibial and the great toe ABI was 0.58 with a pressure of 80 3. I am still using silver alginate on the fifth met head. I think we should go ahead and order an MRI. Dr. Juleen China correctly pointed out that he had had a brain study in 2019 which should mean that the hardware in his neck is not going to affect doing 1 in his foot. 4. If indeed as I suspect he has underlying osteomyelitis it would be possible to do a bone culture. 5. We continued with Santyl to the areas on his buttock these are just about closed. We are using mupirocin to the vein harvest site on the left this also look better Electronic Signature(s) Signed: 01/08/2021 4:22:00 PM By: Linton Ham MD Entered By: Linton Ham on 01/08/2021  11:25:11 -------------------------------------------------------------------------------- SuperBill Details Patient Name: Date of Service: Shane [name], [place]. 01/08/2021 Medical Record Number: [id number] Patient Account Number: [id number] Date of Birth/Sex: Treating RN: 1941-02-02 (80 y.o. [name] Primary Care Provider: Geoffery Lyons Other Clinician: Referring Provider: Treating Provider/Extender: Peter Garter, Constance Goltz in Treatment: 2 Diagnosis Coding ICD-10 Codes Code Description 8430691112 Type 2 diabetes mellitus with foot ulcer E11.51 Type 2 diabetes mellitus with diabetic peripheral angiopathy without gangrene L97.518 Non-pressure chronic ulcer of other part of right foot with other specified severity L89.152 Pressure ulcer of sacral region, stage 2 L89.322 Pressure ulcer of left buttock, stage 2 A49.01 Methicillin susceptible Staphylococcus aureus infection, unspecified site L97.821 Non-pressure chronic ulcer of other part of left lower leg limited to breakdown of skin L03.115 Cellulitis of right lower limb Facility Procedures CPT4 Code: 29798921 Description: [phone] - WOUND CARE VISIT-LEV 5 EST PT Modifier: Quantity: 1 Physician Procedures : CPT4 Code Description Modifier [id number] - WC PHYS LEVEL 4 - EST PT ICD-10 Diagnosis Description E11.621 Type 2 diabetes mellitus with foot ulcer E11.51 Type 2 diabetes mellitus with diabetic peripheral angiopathy without gangrene L03.115  Cellulitis of right lower limb Quantity: 1 Electronic Signature(s) Signed: 01/08/2021 4:22:00 PM By: Linton Ham MD Entered By: Linton Ham on 01/08/2021 11:26:03

## 2021-01-11 ENCOUNTER — Other Ambulatory Visit: Payer: Self-pay | Admitting: Internal Medicine

## 2021-01-11 ENCOUNTER — Encounter: Payer: Self-pay | Admitting: Internal Medicine

## 2021-01-11 ENCOUNTER — Other Ambulatory Visit (HOSPITAL_COMMUNITY): Payer: Self-pay | Admitting: Internal Medicine

## 2021-01-11 ENCOUNTER — Other Ambulatory Visit: Payer: Self-pay

## 2021-01-11 ENCOUNTER — Ambulatory Visit: Payer: Medicare Other | Admitting: Internal Medicine

## 2021-01-11 DIAGNOSIS — B9561 Methicillin susceptible Staphylococcus aureus infection as the cause of diseases classified elsewhere: Secondary | ICD-10-CM | POA: Diagnosis not present

## 2021-01-11 DIAGNOSIS — Z5181 Encounter for therapeutic drug level monitoring: Secondary | ICD-10-CM | POA: Diagnosis not present

## 2021-01-11 DIAGNOSIS — R7881 Bacteremia: Secondary | ICD-10-CM | POA: Diagnosis not present

## 2021-01-11 DIAGNOSIS — E11621 Type 2 diabetes mellitus with foot ulcer: Secondary | ICD-10-CM

## 2021-01-11 DIAGNOSIS — L97519 Non-pressure chronic ulcer of other part of right foot with unspecified severity: Secondary | ICD-10-CM

## 2021-01-11 DIAGNOSIS — E13621 Other specified diabetes mellitus with foot ulcer: Secondary | ICD-10-CM | POA: Diagnosis not present

## 2021-01-11 NOTE — Progress Notes (Signed)
[facility]  CHIEF COMPLAINT:    Follow up for MSSA bacteremia/endocarditis and right foot wound  SUBJECTIVE:    Shane [name] is a 80 y.o. male with PMHx as below who presents to the clinic for follow up of MSSA bacteremia/endocarditis and right foot wound.   He is currently doing well on cefazolin for his bacteremia and endocarditis.  Plan is for 6 weeks of therapy from PPM extraction and will complete therapy on 01/25/21.  More problematic is his right foot wound.  He had a CT scan during admission that did not indicate any evidence of osteomyelitis or abscess.  He underwent vascular intervention during hospitalization to improve blood flow to his lower extremity and now has been following with wound care.  Seen previously on 01/01/21 and had a superficial swab of wound grow abundant Serratia marcescens and rare Enterococcus faecalis.  Most recently seen on 01/08/21 and this previously purulent area now appeared with black eschar over the surface (probably necrotic subcutaneous fat).  It was not debrided on Friday 01/08/21.  An MRI has been ordered and scheduled for tomorrow.  He was started on Levaquin by Dr Dellia Nims.  Please see A&P for the details of today's visit and status of the patient's medical problems.   Patient's Medications  New Prescriptions   No medications on file  Previous Medications   ALBUTEROL (VENTOLIN HFA) 108 (90 BASE) MCG/ACT INHALER    Inhale 2 puffs into the lungs every 4 (four) hours as needed for wheezing or shortness of breath.   APIXABAN (ELIQUIS) 5 MG TABS TABLET    Take 1 tablet (5 mg total) by mouth 2 (two) times daily.   ASCORBIC ACID (VITAMIN C WITH ROSE HIPS) 500 MG TABLET    Take 500 mg by mouth daily.   ASPIRIN EC 81 MG TABLET    Take 81 mg by mouth daily.   ATORVASTATIN (LIPITOR) 40 MG TABLET    Take 40 mg by mouth at bedtime.   CEFAZOLIN (ANCEF) IVPB    Inject 2 g into the vein every 8 (eight) hours. Indication:   Endocarditis First Dose: Yes Last Day of Therapy:  01/25/21 Labs - Once weekly:  CBC/D and BMP, Labs - Every other week:  ESR and CRP Method of administration: IV Push Method of administration may be changed at the discretion of home infusion pharmacist based upon assessment of the patient and/or caregiver's ability to self-administer the medication ordered.   CLOTRIMAZOLE-BETAMETHASONE (LOTRISONE) CREAM    Apply 1 application topically daily as needed (for skin infection).    CONTINUOUS BLOOD GLUC SENSOR (FREESTYLE LIBRE 14 DAY SENSOR) MISC    1 patch every 14 (fourteen) days.   [employer] ACETAMINOPHEN 325 MG TABLET    Take 650 mg by mouth daily as needed for pain.   ENSURE MAX PROTEIN (ENSURE MAX PROTEIN) LIQD    Take 330 mLs (11 oz total) by mouth 2 (two) times daily.   EPINEPHRINE 0.3 MG/0.3 ML IJ SOAJ INJECTION    Inject 0.3 mg into the muscle once as needed for anaphylaxis (max 3 doses).   FUROSEMIDE (LASIX) 40 MG TABLET    Take 40 mg by mouth daily.   INSULIN LISPRO (HUMALOG) 100 UNIT/ML INJECTION    Inject 0.07 mLs (7 Units total) into the skin 3 (three) times daily with meals.   LIVER OIL-ZINC OXIDE (DESITIN) 40 % OINTMENT    Apply 1 application topically in the morning  and at bedtime.   LORAZEPAM (ATIVAN) 1 MG TABLET    Take 1 mg by mouth at bedtime as needed for sleep.    LOSARTAN (COZAAR) 50 MG TABLET    Take 50 mg by mouth daily.   MAGNESIUM OXIDE (MAG-OX) 400 MG TABLET    TAKE 1 CAPSULE BY MOUTH ONCE DAILY   METOPROLOL TARTRATE (LOPRESSOR) 50 MG TABLET    Take 25 mg by mouth 2 (two) times daily.   MULTIPLE VITAMIN (MULTIVITAMIN WITH MINERALS) TABS TABLET    Take 1 tablet by mouth daily.   MUPIROCIN OINTMENT (BACTROBAN) 2 %    SMARTSIG:1 Application Topical 2-3 Times Daily   PANTOPRAZOLE (PROTONIX) 40 MG TABLET    Take 40 mg by mouth daily.   POLYETHYLENE GLYCOL POWDER (GLYCOLAX/MIRALAX) 17 GM/SCOOP POWDER    Take 17 g by mouth daily.   POTASSIUM CHLORIDE SA (KLOR-CON) 20 MEQ TABLET     Take 20-40 mEq by mouth See admin instructions. Takes 2 tablets (40 mEq totally) by mouth for 7 days; then takes 1 tablet (20 mEq totally) by mouth daily after that   PROMETHAZINE-PHENYLEPH-CODEINE (PROMETHAZINE VC/CODEINE) 6.25-5-10 MG/5ML SYRP       SANTYL OINTMENT    Apply topically.   TRESIBA FLEXTOUCH 200 UNIT/ML FLEXTOUCH PEN    Inject 16 Units into the skin at bedtime.  Modified Medications   No medications on file  Discontinued Medications   LEVOFLOXACIN (LEVAQUIN) 750 MG TABLET    Take 750 mg by mouth daily.      Past Medical History:  Diagnosis Date  . 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 Cataract   . Chronic kidney disease    d/t DM  . CKD (chronic kidney disease), stage III ([place])   . Diabetes mellitus    Vgo disposal insulin bolus  simular to insulin pump  . Dyspnea   . GERD (gastroesophageal reflux disease)   . Hyperlipidemia   . Hypertension   . 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 ILD (interstitial lung disease) ([place])   . Moderate aortic stenosis    a. 10/2019 Echo: EF 55-60%, Gr2 DD. Nl RV.   Marland Kitchen Neuromuscular disorder ([place])   . Nonobstructive CAD (coronary artery disease)    a. 2012 Cath: mod, nonobs dzs; b. 10/2016 MV: EF 60%, no ischemia.  . OSA on CPAP 05/05/2017   Unattended Home Sleep Test 7/2/813-AHI 38.6/hour, desaturation to 64%, body weight 261 pounds  . PONV (postoperative nausea and vomiting)   . Sleep apnea     uses cpap asked to bring mask and tubing    Social History   Tobacco Use  . Smoking status: Never Smoker  . Smokeless tobacco: Never Used  Vaping Use  . Vaping Use: Never used  Substance Use Topics  . Alcohol use: No  . Drug use: No    Family History  Problem Relation Age of Onset  . Diabetes Mellitus II Mother   . Emphysema Father 63  . Heart attack Father   . Colon cancer Neg Hx   . Esophageal cancer Neg Hx   . Rectal cancer Neg Hx   . Stomach cancer Neg Hx     Allergies  Allergen Reactions  . Codeine Hives and Itching  .  Ofev [Nintedanib] Diarrhea    SEVERE DIARRHEA  . Other Diarrhea and Other (See Comments)    Esbriet causes elevated LFTs. D/C on 06/14/17 and SEVERE DIARRHEA    Review of Systems  Constitutional: Negative for chills  and fever.  Respiratory: Negative.   Cardiovascular: Negative.   Skin:       +Wound on right foot.      OBJECTIVE:    Vitals:   01/11/21 1427  BP: (!) 99/59  Pulse: 67  Temp: 98 F (36.7 C)  Weight: 245 lb (111.1 kg)  Height: 6' (1.829 m)   Body mass index is 33.23 kg/m.  Physical Exam Constitutional:      General: He is not in acute distress.    Appearance: Normal appearance.  Pulmonary:     Effort: Pulmonary effort is normal. No respiratory distress.  Musculoskeletal:     Comments: Right UE picc line  Skin:    General: Skin is warm and dry.     Findings: Erythema present.     Comments: Right 5th metatarsal head with necrotic eschar.  Minimal erythema.  No warmth.   Neurological:     General: No focal deficit present.     Mental Status: He is alert and oriented to person, place, and time.  Psychiatric:        Mood and Affect: Mood normal.        Behavior: Behavior normal.      Labs and Microbiology: CBC Latest Ref Rng & Units 12/16/2020 12/15/2020 12/14/2020  WBC 4.0 - 10.5 K/uL 7.9 9.2 9.4  Hemoglobin 13.0 - 17.0 g/dL 8.8(L) 8.4(L) 9.1(L)  Hematocrit 39.0 - 52.0 % 26.9(L) 25.7(L) 28.0(L)  Platelets 150 - 400 K/uL 242 204 203   CMP Latest Ref Rng & Units 12/16/2020 12/15/2020 12/12/2020  Glucose 70 - 99 mg/dL 169(H) 147(H) 162(H)  BUN 8 - 23 mg/dL 31(H) 28(H) 31(H)  Creatinine 0.61 - 1.24 mg/dL 1.37(H) 1.41(H) 1.52(H)  Sodium 135 - 145 mmol/L 134(L) 132(L) 134(L)  Potassium 3.5 - 5.1 mmol/L 4.3 4.6 4.2  Chloride 98 - 111 mmol/L 99 100 102  CO2 22 - 32 mmol/L 26 23 23   Calcium 8.9 - 10.3 mg/dL 8.0(L) 8.0(L) 7.8(L)  Total Protein 6.5 - 8.1 g/dL 6.1(L) - -  Total Bilirubin 0.3 - 1.2 mg/dL 0.5 - -  Alkaline Phos 38 - 126 U/L 77 - -  AST 15 - 41  U/L 20 - -  ALT 0 - 44 U/L 10 - -     No results found for this or any previous visit (from the past 240 hour(s)).  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  PLAN: . Continue cefazolin via PICC line through 01/25/21 . 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  Currently followed by wound care and superficial culture is polymicrobial (Serratia and Enterococcus) from 01/01/21.  MRI tomorrow is pending and he has been started on levaquin for the time being.  I reviewed his OPAT labs and WBC has been normal, creatinine improved and CRP stable at 26 1/23 then 27 on 1/31.  Labs drawn by home nurse this morning are pending.  PLAN: . Discussed with patient and his wife this afternoon that MRI will help determine if there is osteo.  If so, favor re-evaluation from Dr Sharol Given to see about appropriate debridement/amputation as necessary . Since he is otherwise well appearing and non-toxic from this foot wound, I favor holding his levaquin pending MRI and possible surgical plan/cultures.  Also discussed wth pharmacy and preferred abx approach would be Bactrim/Augmentin  to treat current bacteria from wound culture . Will follow up after MRI and see him back next week as well.    Encounter for therapeutic drug monitoring OPAT labs have been stable.  PLAN: . Continue monitoring.    Will update Elby Showers at his PCP office   Raynelle Highland for Infectious [facility] 01/11/2021, 3:24 PM

## 2021-01-11 NOTE — Assessment & Plan Note (Signed)
This is an ongoing and evolving issue.  Currently followed by wound care and superficial culture is polymicrobial (Serratia and Enterococcus) from 01/01/21.  MRI tomorrow is pending and he has been started on levaquin for the time being.  I reviewed his OPAT labs and WBC has been normal, creatinine improved and CRP stable at 26 1/23 then 27 on 1/31.  Labs drawn by home nurse this morning are pending.  PLAN: . Discussed with patient and his wife this afternoon that MRI will help determine if there is osteo.  If so, favor re-evaluation from Dr Sharol Given to see about appropriate debridement/amputation as necessary . Since he is otherwise well appearing and non-toxic from this foot wound, I favor holding his levaquin pending MRI and possible surgical plan/cultures.  Also discussed wth pharmacy and preferred abx approach would be Bactrim/Augmentin to treat current bacteria from wound culture . Will follow up after MRI and see him back next week as well.

## 2021-01-11 NOTE — Assessment & Plan Note (Signed)
OPAT labs have been stable.  PLAN: . Continue monitoring.

## 2021-01-11 NOTE — Assessment & Plan Note (Signed)
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  PLAN: . Continue cefazolin via PICC line through 01/25/21 . RTC next week

## 2021-01-11 NOTE — Patient Instructions (Signed)
Thank you for coming to see me today. It was a pleasure seeing you.  To Do: Marland Kitchen Get MRI tomorrow . Continue with cefazolin through your PICC line as you have been doing . Stop Levaquin for now . Keep your appointment with me next week  If you have any questions or concerns, please do not hesitate to call the office at [phone].  Take Care,   Jule Ser, DO

## 2021-01-11 NOTE — Progress Notes (Signed)
Shane, [name] ([id number]) Visit Report for 01/08/2021 Arrival Information Details Patient Name: Date of Service: Shane [name] MES H. 01/08/2021 10:00 A M Medical Record Number: [id number] Patient Account Number: [id number] Date of Birth/Sex: Treating RN: 04/09/1941 (80 y.o. Shane [name] Primary Care Aevah Stansbery: Geoffery Lyons Other Clinician: Referring Malikye Reppond: Treating Kana Reimann/Extender: Peter Garter, Constance Goltz in Treatment: 2 Visit Information History Since Last Visit Added or deleted any medications: No Patient Arrived: Wheel Chair Any new allergies or adverse reactions: No Arrival Time: 10:13 Had a fall or experienced change in No Accompanied By: wife activities of daily living that may affect Transfer Assistance: None risk of falls: Patient Identification Verified: Yes Signs or symptoms of abuse/neglect since last visito No Secondary Verification Process Completed: Yes Hospitalized since last visit: No Patient Requires Transmission-Based Precautions: No Implantable device outside of the clinic excluding No Patient Has Alerts: No cellular tissue based products placed in the center since last visit: Has Dressing in Place as Prescribed: Yes Pain Present Now: Yes Electronic Signature(s) Signed: 01/11/2021 9:06:40 AM By: Sandre Kitty Entered By: Sandre Kitty on 01/08/2021 10:14:21 -------------------------------------------------------------------------------- Clinic Level of Care Assessment Details Patient Name: Date of Service: Shane [name] MES H. 01/08/2021 10:00 A M Medical Record Number: [id number] Patient Account Number: [id number] Date of Birth/Sex: Treating RN: Jun 13, 1941 (80 y.o. Shane [name] Primary Care Gearld Kerstein: Geoffery Lyons Other Clinician: Referring Daking Westervelt: Treating Shane [name]/Extender: Peter Garter, Constance Goltz in Treatment: 2 Clinic Level of Care Assessment Items TOOL 4 Quantity Score []  - 0 Use when  only an EandM is performed on FOLLOW-UP visit ASSESSMENTS - Nursing Assessment / Reassessment X- 1 10 Reassessment of Co-morbidities (includes updates in patient status) X- 1 5 Reassessment of Adherence to Treatment Plan ASSESSMENTS - Wound and Skin A ssessment / Reassessment []  - 0 Simple Wound Assessment / Reassessment - one wound X- 4 5 Complex Wound Assessment / Reassessment - multiple wounds []  - 0 Dermatologic / Skin Assessment (not related to wound area) ASSESSMENTS - Focused Assessment X- 2 5 Circumferential Edema Measurements - multi extremities []  - 0 Nutritional Assessment / Counseling / Intervention X- 1 5 Lower Extremity Assessment (monofilament, tuning fork, pulses) []  - 0 Peripheral Arterial Disease Assessment (using hand held doppler) ASSESSMENTS - Ostomy and/or Continence Assessment and Care []  - 0 Incontinence Assessment and Management []  - 0 Ostomy Care Assessment and Management (repouching, etc.) PROCESS - Coordination of Care X - Simple Patient / Family Education for ongoing care 1 15 []  - 0 Complex (extensive) Patient / Family Education for ongoing care X- 1 10 Staff obtains Programmer, systems, Records, T Results / Process Orders est X- 1 10 Staff telephones HHA, Nursing Homes / Clarify orders / etc []  - 0 Routine Transfer to another Facility (non-emergent condition) []  - 0 Routine Hospital Admission (non-emergent condition) []  - 0 New Admissions / Biomedical engineer / Ordering NPWT Apligraf, etc. , []  - 0 Emergency Hospital Admission (emergent condition) X- 1 10 Simple Discharge Coordination []  - 0 Complex (extensive) Discharge Coordination PROCESS - Special Needs []  - 0 Pediatric / Minor Patient Management []  - 0 Isolation Patient Management []  - 0 Hearing / Language / Visual special needs []  - 0 Assessment of Community assistance (transportation, D/C planning, etc.) []  - 0 Additional assistance / Altered mentation []  - 0 Support  Surface(s) Assessment (bed, cushion, seat, etc.) INTERVENTIONS - Wound Cleansing / Measurement []  - 0 Simple Wound Cleansing - one wound X- 4 5 Complex  Wound Cleansing - multiple wounds X- 1 5 Wound Imaging (photographs - any number of wounds) []  - 0 Wound Tracing (instead of photographs) []  - 0 Simple Wound Measurement - one wound X- 4 5 Complex Wound Measurement - multiple wounds INTERVENTIONS - Wound Dressings X - Small Wound Dressing one or multiple wounds 4 10 []  - 0 Medium Wound Dressing one or multiple wounds []  - 0 Large Wound Dressing one or multiple wounds X- 1 5 Application of Medications - topical []  - 0 Application of Medications - injection INTERVENTIONS - Miscellaneous []  - 0 External ear exam []  - 0 Specimen Collection (cultures, biopsies, blood, body fluids, etc.) []  - 0 Specimen(s) / Culture(s) sent or taken to Lab for analysis []  - 0 Patient Transfer (multiple staff / Civil Service fast streamer / Similar devices) []  - 0 Simple Staple / Suture removal (25 or less) []  - 0 Complex Staple / Suture removal (26 or more) []  - 0 Hypo / Hyperglycemic Management (close monitor of Blood Glucose) []  - 0 Ankle / Brachial Index (ABI) - do not check if billed separately X- 1 5 Vital Signs Has the patient been seen at the hospital within the last three years: Yes Total Score: 190 Level Of Care: New/Established - Level 5 Electronic Signature(s) Signed: 01/08/2021 4:47:51 PM By: Baruch Gouty RN, BSN Entered By: Baruch Gouty on 01/08/2021 10:55:20 -------------------------------------------------------------------------------- Encounter Discharge Information Details Patient Name: Date of Service: Shane [name], [name] MES H. 01/08/2021 10:00 A M Medical Record Number: [id number] Patient Account Number: [id number] Date of Birth/Sex: Treating RN: [birth year] (80 y.o. Shane [name] Primary Care Caress Reffitt: Geoffery Lyons Other Clinician: Referring Kamarion Zagami: Treating Danaysia Rader/Extender:  Peter Garter, Constance Goltz in Treatment: 2 Encounter Discharge Information Items Discharge Condition: Stable Ambulatory Status: Wheelchair Discharge Destination: Home Transportation: Private Auto Accompanied By: wife Schedule Follow-up Appointment: Yes Clinical Summary of Care: Electronic Signature(s) Signed: 01/08/2021 4:56:10 PM By: Deon Pilling Entered By: Deon Pilling on 01/08/2021 11:35:32 -------------------------------------------------------------------------------- Lower Extremity Assessment Details Patient Name: Date of Service: Shane [name] MES H. 01/08/2021 10:00 Ward Record Number: [id number] Patient Account Number: [id number] Date of Birth/Sex: Treating RN: [birth year] (80 y.o. Shane [name] Primary Care Dawson Albers: Geoffery Lyons Other Clinician: Referring Sonam Huelsmann: Treating Dryden Tapley/Extender: Peter Garter, Constance Goltz in Treatment: 2 Edema Assessment Assessed: Shirlyn Goltz: No] [Right: No] Edema: [Left: Yes] [Right: Yes] Calf Left: Right: Point of Measurement: 38 cm From Medial Instep 42.8 cm 43.5 cm Ankle Left: Right: Point of Measurement: 12 cm From Medial Instep 24.6 cm 25.8 cm Vascular Assessment Pulses: Dorsalis Pedis Palpable: [Left:No] [Right:No] Electronic Signature(s) Signed: 01/08/2021 4:47:51 PM By: Baruch Gouty RN, BSN Entered By: Baruch Gouty on 01/08/2021 10:41:29 -------------------------------------------------------------------------------- Multi Wound Chart Details Patient Name: Date of Service: Shane [name], [name] MES H. 01/08/2021 10:00 A M Medical Record Number: [id number] Patient Account Number: [id number] Date of Birth/Sex: Treating RN: 1941/02/21 (80 y.o. Shane [name] Primary Care Cherylyn Sundby: Geoffery Lyons Other Clinician: Referring Lizania Bouchard: Treating Notnamed Croucher/Extender: Peter Garter, Constance Goltz in Treatment: 2 Vital Signs Height(in): 72 Capillary Blood Glucose(mg/dl):  109 Weight(lbs): 256 Pulse(bpm): 21 Body Mass Index(BMI): 35 Blood Pressure(mmHg): 109/64 Temperature(F): 97.5 Respiratory Rate(breaths/min): 17 Photos: [1:No Photos Right, Lateral Foot] [2:No Photos Coccyx] [3:No Photos Left Gluteus] Wound Location: [1:Gradually Appeared] [2:Gradually Appeared] [3:Gradually Appeared] Wounding Event: [1:Diabetic Wound/Ulcer of the Lower] [2:Pressure Ulcer] [3:Pressure Ulcer] Primary Etiology: [1:Extremity Sleep Apnea, Arrhythmia, Coronary Sleep Apnea, Arrhythmia, Coronary Sleep Apnea, Arrhythmia, Coronary] Comorbid History: [1:Artery Disease,  Hypertension, Peripheral Arterial Disease, Type II Peripheral Arterial Disease, Type II Peripheral Arterial Disease, Type II Diabetes, Neuropathy 12/08/2020] [2:Artery Disease, Hypertension, Diabetes, Neuropathy  11/13/2020] [3:Artery Disease, Hypertension, Diabetes, Neuropathy 11/13/2020] Date Acquired: [1:2] [2:2] [3:2] Weeks of Treatment: [1:Open] [2:Open] [3:Open] Wound Status: [1:2.5x2x1.5] [2:0.8x0.3x0.1] [3:0.3x0.4x0.1] Measurements L x W x D (cm) [1:3.927] [2:0.188] [3:0.094] A (cm) : rea [1:5.89] [6:2.694] [3:0.009] Volume (cm) : [1:20.60%] [2:60.10%] [3:76.10%] % Reduction in A rea: [1:-1089.90%] [2:79.80%] [3:88.60%] % Reduction in Volume: [1:Grade 2] [2:Category/Stage II] [3:Category/Stage II] Classification: [1:Medium] [2:Small] [3:Small] Exudate A mount: [1:Serosanguineous] [2:Serosanguineous] [3:Serosanguineous] Exudate Type: [1:red, brown] [2:red, brown] [3:red, brown] Exudate Color: [1:Distinct, outline attached] [2:Flat and Intact] [3:Flat and Intact] Wound Margin: [1:Small (1-33%)] [2:Small (1-33%)] [3:Large (67-100%)] Granulation A mount: [1:Pink] [2:Pink] [3:Pink] Granulation Quality: [1:Large (67-100%)] [2:Large (67-100%)] [3:None Present (0%)] Necrotic A mount: [1:Eschar, Adherent Slough] [2:Adherent Slough] [3:N/A] Necrotic Tissue: [1:Fat Layer (Subcutaneous Tissue): Yes Fat Layer  (Subcutaneous Tissue): Yes Fat Layer (Subcutaneous Tissue): Yes] Exposed Structures: [1:Fascia: No Tendon: No Muscle: No Joint: No Bone: No None] [2:Fascia: No Tendon: No Muscle: No Joint: No Bone: No Small (1-33%)] [3:Fascia: No Tendon: No Muscle: No Joint: No Bone: No Large (67-100%)] Wound Number: 4 N/A N/A Photos: No Photos N/A N/A Left, Medial Lower Leg N/A N/A Wound Location: Surgical Injury N/A N/A Wounding Event: Open Surgical Wound N/A N/A Primary Etiology: Sleep Apnea, Arrhythmia, Coronary N/A N/A Comorbid History: Artery Disease, Hypertension, Peripheral Arterial Disease, Type II Diabetes, Neuropathy 11/04/2020 N/A N/A Date Acquired: 1 N/A N/A Weeks of Treatment: Open N/A N/A Wound Status: 1x0.2x0.1 N/A N/A Measurements L x W x D (cm) 0.157 N/A N/A A (cm) : rea 0.016 N/A N/A Volume (cm) : 63.70% N/A N/A % Reduction in Area: 62.80% N/A N/A % Reduction in Volume: Full Thickness Without Exposed N/A N/A Classification: Support Structures Medium N/A N/A Exudate A mount: Serosanguineous N/A N/A Exudate Type: red, brown N/A N/A Exudate Color: Distinct, outline attached N/A N/A Wound Margin: Small (1-33%) N/A N/A Granulation Amount: Pink N/A N/A Granulation Quality: Large (67-100%) N/A N/A Necrotic Amount: Adherent Slough N/A N/A Necrotic Tissue: Fat Layer (Subcutaneous Tissue): Yes N/A N/A Exposed Structures: Fascia: No Tendon: No Muscle: No Joint: No Bone: No Small (1-33%) N/A N/A Epithelialization: Treatment Notes Electronic Signature(s) Signed: 01/08/2021 4:22:00 PM By: Linton Ham MD Signed: 01/08/2021 4:47:51 PM By: Baruch Gouty RN, BSN Entered By: Linton Ham on 01/08/2021 11:15:46 -------------------------------------------------------------------------------- Multi-Disciplinary Care Plan Details Patient Name: Date of Service: Shane [name], [name] MES H. 01/08/2021 10:00 A M Medical Record Number: [id number] Patient Account Number:  [id number] Date of Birth/Sex: Treating RN: 1941-08-04 (80 y.o. Shane [name] Primary Care Day Greb: Geoffery Lyons Other Clinician: Referring Ubaldo Daywalt: Treating Marasia Newhall/Extender: Peter Garter, Constance Goltz in Treatment: 2 Active Inactive Abuse / Safety / Falls / Self Care Management Nursing Diagnoses: Potential for falls Goals: Patient/caregiver will verbalize/demonstrate measures taken to prevent injury and/or falls Date Initiated: 12/25/2020 Target Resolution Date: 01/22/2021 Goal Status: Active Interventions: Assess fall risk on admission and as needed Assess impairment of mobility on admission and as needed per policy Notes: Nutrition Nursing Diagnoses: Impaired glucose control: actual or potential Potential for alteratiion in Nutrition/Potential for imbalanced nutrition Goals: Patient/caregiver will maintain therapeutic glucose control Date Initiated: 12/25/2020 Target Resolution Date: 01/22/2021 Goal Status: Active Interventions: Assess HgA1c results as ordered upon admission and as needed Assess patient nutrition upon admission and as needed per policy Provide education on elevated blood sugars and impact on wound healing Treatment  Activities: Patient referred to Primary Care Physician for further nutritional evaluation : 12/25/2020 Notes: Pressure Nursing Diagnoses: Knowledge deficit related to causes and risk factors for pressure ulcer development Knowledge deficit related to management of pressures ulcers Potential for impaired tissue integrity related to pressure, friction, moisture, and shear Goals: Patient/caregiver will verbalize understanding of pressure ulcer management Date Initiated: 12/25/2020 Target Resolution Date: 01/22/2021 Goal Status: Active Interventions: Assess: immobility, friction, shearing, incontinence upon admission and as needed Assess potential for pressure ulcer upon admission and as needed Provide education on  pressure ulcers Notes: Tissue Oxygenation Nursing Diagnoses: Actual ineffective tissue perfusion; peripheral (select once diagnosis is confirmed) Knowledge deficit related to disease process and management Goals: Patient/caregiver will verbalize understanding of disease process and disease management Date Initiated: 12/25/2020 Target Resolution Date: 01/22/2021 Goal Status: Active Interventions: Assess patient understanding of disease process and management upon diagnosis and as needed Assess peripheral arterial status upon admission and as needed Provide education on tissue oxygenation and ischemia Treatment Activities: T ordered outside of clinic : 12/25/2020 est Notes: Wound/Skin Impairment Nursing Diagnoses: Impaired tissue integrity Knowledge deficit related to ulceration/compromised skin integrity Goals: Patient/caregiver will verbalize understanding of skin care regimen Date Initiated: 12/25/2020 Target Resolution Date: 01/22/2021 Goal Status: Active Ulcer/skin breakdown will have a volume reduction of 30% by week 4 Date Initiated: 12/25/2020 Target Resolution Date: 01/22/2021 Goal Status: Active Interventions: Assess patient/caregiver ability to obtain necessary supplies Assess patient/caregiver ability to perform ulcer/skin care regimen upon admission and as needed Assess ulceration(s) every visit Provide education on ulcer and skin care Treatment Activities: Skin care regimen initiated : 12/25/2020 Topical wound management initiated : 12/25/2020 Notes: Electronic Signature(s) Signed: 01/08/2021 4:47:51 PM By: Baruch Gouty RN, BSN Entered By: Baruch Gouty on 01/08/2021 10:53:26 -------------------------------------------------------------------------------- Pain Assessment Details Patient Name: Date of Service: Shane [name], [name] MES H. 01/08/2021 10:00 A M Medical Record Number: [id number] Patient Account Number: [id number] Date of Birth/Sex: Treating RN: 1941-07-26 (80  y.o. Shane [name] Primary Care Marcellus Pulliam: Geoffery Lyons Other Clinician: Referring Gabrianna Fassnacht: Treating Chaos Carlile/Extender: Peter Garter, Constance Goltz in Treatment: 2 Active Problems Location of Pain Severity and Description of Pain Patient Has Paino Yes Site Locations Pain Location: Pain in Ulcers With Dressing Change: Yes Duration of the Pain. Constant / Intermittento Intermittent Rate the pain. Current Pain Level: 4 Least Pain Level: 0 Character of Pain Describe the Pain: Aching Pain Management and Medication Current Pain Management: Medication: Yes Other: reposition Rest: Yes Is the Current Pain Management Adequate: Adequate How does your wound impact your activities of daily livingo Sleep: No Bathing: No Appetite: No Relationship With Others: No Bladder Continence: No Emotions: No Bowel Continence: No Work: No Toileting: No Drive: No Dressing: No Hobbies: No Electronic Signature(s) Signed: 01/08/2021 4:47:51 PM By: Baruch Gouty RN, BSN Entered By: Baruch Gouty on 01/08/2021 10:39:19 -------------------------------------------------------------------------------- Patient/Caregiver Education Details Patient Name: Date of Service: Shane [name] MES H. 2/4/2022andnbsp10:00 A M Medical Record Number: [id number] Patient Account Number: [id number] Date of Birth/Gender: Treating RN: 27-Aug-1941 (80 y.o. Shane [name] Primary Care Physician: Geoffery Lyons Other Clinician: Referring Physician: Treating Physician/Extender: Inetta Fermo in Treatment: 2 Education Assessment Education Provided To: Patient Education Topics Provided Elevated Blood Sugar/ Impact on Healing: Methods: Explain/Verbal Responses: Reinforcements needed, State content correctly Pressure: Methods: Explain/Verbal Responses: Reinforcements needed, State content correctly Wound/Skin Impairment: Methods: Explain/Verbal Responses:  Reinforcements needed, State content correctly Electronic Signature(s) Signed: 01/08/2021 4:47:51 PM By: Baruch Gouty RN, BSN Entered By: Johna Roles,  Linda on 01/08/2021 10:54:22 -------------------------------------------------------------------------------- Wound Assessment Details Patient Name: Date of Service: Shane [name] MES H. 01/08/2021 10:00 A M Medical Record Number: [id number] Patient Account Number: [id number] Date of Birth/Sex: Treating RN: 1941-10-08 (80 y.o. Shane [name] Primary Care Lynette Topete: Geoffery Lyons Other Clinician: Referring Zandrea Kenealy: Treating Kacey Dysert/Extender: Peter Garter, Constance Goltz in Treatment: 2 Wound Status Wound Number: 1 Primary Diabetic Wound/Ulcer of the Lower Extremity Etiology: Wound Location: Right, Lateral Foot Wound Open Wounding Event: Gradually Appeared Status: Date Acquired: 12/08/2020 Comorbid Sleep Apnea, Arrhythmia, Coronary Artery Disease, Hypertension, Weeks Of Treatment: 2 History: Peripheral Arterial Disease, Type II Diabetes, Neuropathy Clustered Wound: No Photos Photo Uploaded By: Mikeal Hawthorne on 01/08/2021 15:43:26 Wound Measurements Length: (cm) 2.5 Width: (cm) 2 Depth: (cm) 1.5 Area: (cm) 3.927 Volume: (cm) 5.89 % Reduction in Area: 20.6% % Reduction in Volume: -1089.9% Epithelialization: None Tunneling: No Undermining: No Wound Description Classification: Grade 2 Wound Margin: Distinct, outline attached Exudate Amount: Medium Exudate Type: Serosanguineous Exudate Color: red, brown Foul Odor After Cleansing: No Slough/Fibrino Yes Wound Bed Granulation Amount: Small (1-33%) Exposed Structure Granulation Quality: Pink Fascia Exposed: No Necrotic Amount: Large (67-100%) Fat Layer (Subcutaneous Tissue) Exposed: Yes Necrotic Quality: Eschar, Adherent Slough Tendon Exposed: No Muscle Exposed: No Joint Exposed: No Bone Exposed: No Treatment Notes Wound #1 (Foot) Wound Laterality:  Right, Lateral Cleanser Soap and Water Discharge Instruction: May shower and wash wound with dial antibacterial soap and water prior to dressing change. Peri-Wound Care Topical Primary Dressing KerraCel Ag Gelling Fiber Dressing, 4x5 in (silver alginate) Discharge Instruction: Apply silver alginate to wound bed , pack lightly into wound Secondary Dressing Woven Gauze Sponge, Non-Sterile 4x4 in Discharge Instruction: Apply over primary dressing as directed. Secured With Conforming Stretch Gauze Bandage, Sterile 2x75 (in/in) Discharge Instruction: Secure with stretch gauze as directed. Compression Wrap Compression Stockings Add-Ons Electronic Signature(s) Signed: 01/08/2021 4:47:51 PM By: Baruch Gouty RN, BSN Entered By: Baruch Gouty on 01/08/2021 10:41:54 -------------------------------------------------------------------------------- Wound Assessment Details Patient Name: Date of Service: Shane [name], [name] MES H. 01/08/2021 10:00 A M Medical Record Number: [id number] Patient Account Number: [id number] Date of Birth/Sex: Treating RN: Jul 06, 1941 (80 y.o. Shane [name] Primary Care Aquilla Shambley: Geoffery Lyons Other Clinician: Referring Gayle Collard: Treating Xxavier Noon/Extender: Peter Garter, Constance Goltz in Treatment: 2 Wound Status Wound Number: 2 Primary Pressure Ulcer Etiology: Wound Location: Coccyx Wound Open Wounding Event: Gradually Appeared Status: Date Acquired: 11/13/2020 Comorbid Sleep Apnea, Arrhythmia, Coronary Artery Disease, Hypertension, Weeks Of Treatment: 2 History: Peripheral Arterial Disease, Type II Diabetes, Neuropathy Clustered Wound: No Photos Photo Uploaded By: Mikeal Hawthorne on 01/08/2021 15:43:27 Wound Measurements Length: (cm) 0.8 Width: (cm) 0.3 Depth: (cm) 0.1 Area: (cm) 0.188 Volume: (cm) 0.019 % Reduction in Area: 60.1% % Reduction in Volume: 79.8% Epithelialization: Small (1-33%) Tunneling: No Undermining: No Wound  Description Classification: Category/Stage II Wound Margin: Flat and Intact Exudate Amount: Small Exudate Type: Serosanguineous Exudate Color: red, brown Foul Odor After Cleansing: No Slough/Fibrino Yes Wound Bed Granulation Amount: Small (1-33%) Exposed Structure Granulation Quality: Pink Fascia Exposed: No Necrotic Amount: Large (67-100%) Fat Layer (Subcutaneous Tissue) Exposed: Yes Necrotic Quality: Adherent Slough Tendon Exposed: No Muscle Exposed: No Joint Exposed: No Bone Exposed: No Treatment Notes Wound #2 (Coccyx) Cleanser Peri-Wound Care Topical Primary Dressing Santyl Ointment Discharge Instruction: Apply nickel thick amount to wound bed as instructed Secondary Dressing ComfortFoam Border, 4x4 in (silicone border) Discharge Instruction: Apply over primary dressing as directed. Secured With Compression Wrap Compression Stockings Environmental education officer)  Signed: 01/08/2021 4:47:51 PM By: Baruch Gouty RN, BSN Entered By: Baruch Gouty on 01/08/2021 10:42:21 -------------------------------------------------------------------------------- Wound Assessment Details Patient Name: Date of Service: Shane [name], [name] MES H. 01/08/2021 10:00 A M Medical Record Number: [id number] Patient Account Number: [id number] Date of Birth/Sex: Treating RN: [birth year] (80 y.o. Shane [name] Primary Care Jahzaria Vary: Geoffery Lyons Other Clinician: Referring Odies Desa: Treating Jacquese Cassarino/Extender: Peter Garter, Constance Goltz in Treatment: 2 Wound Status Wound Number: 3 Primary Pressure Ulcer Etiology: Wound Location: Left Gluteus Wound Open Wounding Event: Gradually Appeared Status: Date Acquired: 11/13/2020 Comorbid Sleep Apnea, Arrhythmia, Coronary Artery Disease, Hypertension, Weeks Of Treatment: 2 History: Peripheral Arterial Disease, Type II Diabetes, Neuropathy Clustered Wound: No Photos Photo Uploaded By: Mikeal Hawthorne on 01/08/2021  15:46:46 Wound Measurements Length: (cm) 0.3 Width: (cm) 0.4 Depth: (cm) 0.1 Area: (cm) 0.094 Volume: (cm) 0.009 % Reduction in Area: 76.1% % Reduction in Volume: 88.6% Epithelialization: Large (67-100%) Tunneling: No Undermining: No Wound Description Classification: Category/Stage II Wound Margin: Flat and Intact Exudate Amount: Small Exudate Type: Serosanguineous Exudate Color: red, brown Foul Odor After Cleansing: No Slough/Fibrino Yes Wound Bed Granulation Amount: Large (67-100%) Exposed Structure Granulation Quality: Pink Fascia Exposed: No Necrotic Amount: None Present (0%) Fat Layer (Subcutaneous Tissue) Exposed: Yes Tendon Exposed: No Muscle Exposed: No Joint Exposed: No Bone Exposed: No Treatment Notes Wound #3 (Gluteus) Wound Laterality: Left Cleanser Peri-Wound Care Topical Primary Dressing Santyl Ointment Discharge Instruction: Apply nickel thick amount to wound bed as instructed Secondary Dressing ComfortFoam Border, 4x4 in (silicone border) Discharge Instruction: Apply over primary dressing as directed. Secured With Compression Wrap Compression Stockings Environmental education officer) Signed: 01/08/2021 4:47:51 PM By: Baruch Gouty RN, BSN Entered By: Baruch Gouty on 01/08/2021 10:42:59 -------------------------------------------------------------------------------- Wound Assessment Details Patient Name: Date of Service: Shane [name], [name] MES H. 01/08/2021 10:00 A M Medical Record Number: [id number] Patient Account Number: [id number] Date of Birth/Sex: Treating RN: Jul 24, 1941 (80 y.o. Shane [name] Primary Care Jarica Plass: Geoffery Lyons Other Clinician: Referring Marx Doig: Treating Selby Slovacek/Extender: Peter Garter, Constance Goltz in Treatment: 2 Wound Status Wound Number: 4 Primary Open Surgical Wound Etiology: Wound Location: Left, Medial Lower Leg Wound Open Wounding Event: Surgical Injury Status: Date Acquired:  11/04/2020 Comorbid Sleep Apnea, Arrhythmia, Coronary Artery Disease, Hypertension, Weeks Of Treatment: 1 History: Peripheral Arterial Disease, Type II Diabetes, Neuropathy Clustered Wound: No Photos Photo Uploaded By: Mikeal Hawthorne on 01/08/2021 15:43:04 Wound Measurements Length: (cm) 1 Width: (cm) 0.2 Depth: (cm) 0.1 Area: (cm) 0.157 Volume: (cm) 0.016 % Reduction in Area: 63.7% % Reduction in Volume: 62.8% Epithelialization: Small (1-33%) Tunneling: No Undermining: No Wound Description Classification: Full Thickness Without Exposed Support Structures Wound Margin: Distinct, outline attached Exudate Amount: Medium Exudate Type: Serosanguineous Exudate Color: red, brown Foul Odor After Cleansing: No Slough/Fibrino Yes Wound Bed Granulation Amount: Small (1-33%) Exposed Structure Granulation Quality: Pink Fascia Exposed: No Necrotic Amount: Large (67-100%) Fat Layer (Subcutaneous Tissue) Exposed: Yes Necrotic Quality: Adherent Slough Tendon Exposed: No Muscle Exposed: No Joint Exposed: No Bone Exposed: No Treatment Notes Wound #4 (Lower Leg) Wound Laterality: Left, Medial Cleanser Soap and Water Discharge Instruction: May shower and wash wound with dial antibacterial soap and water prior to dressing change. Peri-Wound Care Topical Mupirocin Ointment Discharge Instruction: Apply Mupirocin (Bactroban) as instructed Primary Dressing Secondary Dressing ComfortFoam Border, 4x4 in (silicone border) Discharge Instruction: Apply over primary dressing as directed. Secured With Compression Wrap Compression Stockings Environmental education officer) Signed: 01/08/2021 4:47:51 PM By: Baruch Gouty RN, BSN Entered By: Johna Roles,  Linda on 01/08/2021 10:43:34 -------------------------------------------------------------------------------- Vitals Details Patient Name: Date of Service: Shane [name] MES H. 01/08/2021 10:00 A M Medical Record Number: [id number] Patient Account  Number: [id number] Date of Birth/Sex: Treating RN: [birth year] (80 y.o. Shane [name] Primary Care Chanele Douglas: Geoffery Lyons Other Clinician: Referring Veryl Winemiller: Treating Darianne Muralles/Extender: Peter Garter, Constance Goltz in Treatment: 2 Vital Signs Time Taken: 10:14 Temperature (F): 97.5 Height (in): 72 Pulse (bpm): 65 Weight (lbs): 256 Respiratory Rate (breaths/min): 17 Body Mass Index (BMI): 34.7 Blood Pressure (mmHg): 109/64 Capillary Blood Glucose (mg/dl): 109 Reference Range: 80 - 120 mg / dl Electronic Signature(s) Signed: 01/11/2021 9:06:40 AM By: Sandre Kitty Entered By: Sandre Kitty on 01/08/2021 10:14:55

## 2021-01-12 ENCOUNTER — Ambulatory Visit
Admission: RE | Admit: 2021-01-12 | Discharge: 2021-01-12 | Disposition: A | Discharge status: Home / Self Care | Payer: Medicare Other | Source: Ambulatory Visit | Attending: Internal Medicine | Admitting: Internal Medicine

## 2021-01-12 ENCOUNTER — Other Ambulatory Visit: Payer: Self-pay | Admitting: Internal Medicine

## 2021-01-12 ENCOUNTER — Encounter (HOSPITAL_COMMUNITY): Payer: Medicare Other

## 2021-01-12 ENCOUNTER — Encounter: Payer: Medicare Other | Admitting: Vascular Surgery

## 2021-01-12 DIAGNOSIS — E11621 Type 2 diabetes mellitus with foot ulcer: Secondary | ICD-10-CM

## 2021-01-12 DIAGNOSIS — M869 Osteomyelitis, unspecified: Secondary | ICD-10-CM

## 2021-01-12 MED ORDER — GADOBENATE DIMEGLUMINE 529 MG/ML IV SOLN
20.0000 mL | Freq: Once | INTRAVENOUS | Status: AC | PRN
  Administered 2021-01-12: 20 mL via INTRAVENOUS

## 2021-01-14 ENCOUNTER — Ambulatory Visit: Payer: Medicare Other | Admitting: Orthopedic Surgery

## 2021-01-14 DIAGNOSIS — M869 Osteomyelitis, unspecified: Secondary | ICD-10-CM | POA: Diagnosis not present

## 2021-01-15 ENCOUNTER — Encounter (HOSPITAL_BASED_OUTPATIENT_CLINIC_OR_DEPARTMENT_OTHER): Payer: Medicare Other | Admitting: Internal Medicine

## 2021-01-15 ENCOUNTER — Other Ambulatory Visit: Payer: Self-pay

## 2021-01-15 DIAGNOSIS — E11621 Type 2 diabetes mellitus with foot ulcer: Secondary | ICD-10-CM | POA: Diagnosis not present

## 2021-01-15 NOTE — Progress Notes (Addendum)
Shane [name], Shane [name] ([id number]) Visit Report for 01/15/2021 Arrival Information Details Patient Name: Date of Service: Shane [name] MES H. 01/15/2021 10:30 A M Medical Record Number: [id number] Patient Account Number: [id number] Date of Birth/Sex: Treating RN: 30-Oct-1941 (80 y.o. Shane [name], Shane [name] Primary Care : Geoffery Lyons Other Clinician: Referring : Treating /Extender: Peter Garter, Constance Goltz in Treatment: 3 Visit Information History Since Last Visit Added or deleted any medications: No Patient Arrived: Wheel Chair Any new allergies or adverse reactions: No Arrival Time: 10:28 Had a fall or experienced change in No Accompanied By: wifey activities of daily living that may affect Transfer Assistance: None risk of falls: Patient Identification Verified: Yes Signs or symptoms of abuse/neglect since last visito No Secondary Verification Process Completed: Yes Hospitalized since last visit: No Patient Requires Transmission-Based Precautions: No Implantable device outside of the clinic excluding No Patient Has Alerts: No cellular tissue based products placed in the center since last visit: Has Dressing in Place as Prescribed: Yes Pain Present Now: No Electronic Signature(s) Signed: 01/15/2021 5:40:25 PM By: Rhae Hammock RN Entered By: Rhae Hammock on 01/15/2021 10:33:42 -------------------------------------------------------------------------------- Clinic Level of Care Assessment Details Patient Name: Date of Service: Shane [name] MES H. 01/15/2021 10:30 A M Medical Record Number: [id number] Patient Account Number: [id number] Date of Birth/Sex: Treating RN: 1941-06-13 (80 y.o. [name] Primary Care : Geoffery Lyons Other Clinician: Referring : Treating /Extender: Peter Garter, Constance Goltz in Treatment: 3 Clinic Level of Care Assessment Items TOOL 4 Quantity Score []  -  0 Use when only an EandM is performed on FOLLOW-UP visit ASSESSMENTS - Nursing Assessment / Reassessment X- 1 10 Reassessment of Co-morbidities (includes updates in patient status) X- 1 5 Reassessment of Adherence to Treatment Plan ASSESSMENTS - Wound and Skin A ssessment / Reassessment []  - 0 Simple Wound Assessment / Reassessment - one wound X- 4 5 Complex Wound Assessment / Reassessment - multiple wounds []  - 0 Dermatologic / Skin Assessment (not related to wound area) ASSESSMENTS - Focused Assessment []  - 0 Circumferential Edema Measurements - multi extremities []  - 0 Nutritional Assessment / Counseling / Intervention X- 1 5 Lower Extremity Assessment (monofilament, tuning fork, pulses) []  - 0 Peripheral Arterial Disease Assessment (using hand held doppler) ASSESSMENTS - Ostomy and/or Continence Assessment and Care []  - 0 Incontinence Assessment and Management []  - 0 Ostomy Care Assessment and Management (repouching, etc.) PROCESS - Coordination of Care X - Simple Patient / Family Education for ongoing care 1 15 []  - 0 Complex (extensive) Patient / Family Education for ongoing care X- 1 10 Staff obtains Programmer, systems, Records, T Results / Process Orders est X- 1 10 Staff telephones HHA, Nursing Homes / Clarify orders / etc []  - 0 Routine Transfer to another Facility (non-emergent condition) []  - 0 Routine Hospital Admission (non-emergent condition) []  - 0 New Admissions / Biomedical engineer / Ordering NPWT Apligraf, etc. , []  - 0 Emergency Hospital Admission (emergent condition) X- 1 10 Simple Discharge Coordination []  - 0 Complex (extensive) Discharge Coordination PROCESS - Special Needs []  - 0 Pediatric / Minor Patient Management []  - 0 Isolation Patient Management []  - 0 Hearing / Language / Visual special needs []  - 0 Assessment of Community assistance (transportation, D/C planning, etc.) []  - 0 Additional assistance / Altered mentation []  -  0 Support Surface(s) Assessment (bed, cushion, seat, etc.) INTERVENTIONS - Wound Cleansing / Measurement []  - 0 Simple Wound Cleansing - one wound X- 4 5  Complex Wound Cleansing - multiple wounds X- 1 5 Wound Imaging (photographs - any number of wounds) []  - 0 Wound Tracing (instead of photographs) []  - 0 Simple Wound Measurement - one wound X- 4 5 Complex Wound Measurement - multiple wounds INTERVENTIONS - Wound Dressings X - Small Wound Dressing one or multiple wounds 3 10 []  - 0 Medium Wound Dressing one or multiple wounds []  - 0 Large Wound Dressing one or multiple wounds X- 1 5 Application of Medications - topical []  - 0 Application of Medications - injection INTERVENTIONS - Miscellaneous []  - 0 External ear exam []  - 0 Specimen Collection (cultures, biopsies, blood, body fluids, etc.) []  - 0 Specimen(s) / Culture(s) sent or taken to Lab for analysis []  - 0 Patient Transfer (multiple staff / Civil Service fast streamer / Similar devices) []  - 0 Simple Staple / Suture removal (25 or less) []  - 0 Complex Staple / Suture removal (26 or more) []  - 0 Hypo / Hyperglycemic Management (close monitor of Blood Glucose) []  - 0 Ankle / Brachial Index (ABI) - do not check if billed separately X- 1 5 Vital Signs Has the patient been seen at the hospital within the last three years: Yes Total Score: 170 Level Of Care: New/Established - Level 5 Electronic Signature(s) Signed: 01/15/2021 5:49:38 PM By: Baruch Gouty RN, BSN Entered By: Baruch Gouty on 01/15/2021 11:53:51 -------------------------------------------------------------------------------- Encounter Discharge Information Details Patient Name: Date of Service: Shane [name], Shane [name] MES H. 01/15/2021 10:30 A M Medical Record Number: [id number] Patient Account Number: [id number] Date of Birth/Sex: Treating RN: 1941/07/19 (80 y.o. Shane [name], Shane [name] Primary Care : Geoffery Lyons Other Clinician: Referring : Treating  /Extender: Peter Garter, Constance Goltz in Treatment: 3 Encounter Discharge Information Items Discharge Condition: Stable Ambulatory Status: Wheelchair Discharge Destination: Home Transportation: Private Auto Accompanied By: self Schedule Follow-up Appointment: Yes Clinical Summary of Care: Patient Declined Electronic Signature(s) Signed: 01/15/2021 5:40:25 PM By: Rhae Hammock RN Entered By: Rhae Hammock on 01/15/2021 11:36:54 -------------------------------------------------------------------------------- Lower Extremity Assessment Details Patient Name: Date of Service: Shane [name] MES H. 01/15/2021 10:30 A M Medical Record Number: [id number] Patient Account Number: [id number] Date of Birth/Sex: Treating RN: [birth year] (80 y.o. Shane [name], Shane [name] Primary Care : Geoffery Lyons Other Clinician: Referring : Treating /Extender: Peter Garter, Constance Goltz in Treatment: 3 Edema Assessment Assessed: Shirlyn Goltz: No] Patrice Paradise: No] Edema: [Left: Yes] [Right: Yes] Calf Left: Right: Point of Measurement: 38 cm From Medial Instep 43 cm 43 cm Ankle Left: Right: Point of Measurement: 12 cm From Medial Instep 25 cm 26 cm Vascular Assessment Pulses: Dorsalis Pedis Palpable: [Left:Yes] [Right:Yes] Posterior Tibial Palpable: [Left:Yes] [Right:Yes] Electronic Signature(s) Signed: 01/15/2021 5:40:25 PM By: Rhae Hammock RN Entered By: Rhae Hammock on 01/15/2021 10:34:35 -------------------------------------------------------------------------------- Multi Wound Chart Details Patient Name: Date of Service: Shane [name], Shane [name] MES H. 01/15/2021 10:30 A M Medical Record Number: [id number] Patient Account Number: [id number] Date of Birth/Sex: Treating RN: October 02, 1941 (80 y.o. [name] Primary Care : Geoffery Lyons Other Clinician: Referring : Treating /Extender: Peter Garter,  Constance Goltz in Treatment: 3 Vital Signs Height(in): 72 Capillary Blood Glucose(mg/dl): 121 Weight(lbs): 256 Pulse(bpm): 65 Body Mass Index(BMI): 35 Blood Pressure(mmHg): 137/67 Temperature(F): 98.7 Respiratory Rate(breaths/min): 17 Photos: [1:No Photos Right, Lateral Foot] [2:No Photos Coccyx] [3:No Photos Left Gluteus] Wound Location: [1:Gradually Appeared] [2:Gradually Appeared] [3:Gradually Appeared] Wounding Event: [1:Diabetic Wound/Ulcer of the Lower] [2:Pressure Ulcer] [3:Pressure Ulcer] Primary Etiology: [1:Extremity Sleep Apnea, Arrhythmia, Coronary Sleep Apnea, Arrhythmia, Coronary  Sleep Apnea, Arrhythmia, Coronary] Comorbid History: [1:Artery Disease, Hypertension, Peripheral Arterial Disease, Type II Peripheral Arterial Disease, Type II Peripheral Arterial Disease, Type II Diabetes, Neuropathy 12/08/2020] [2:Artery Disease, Hypertension, Diabetes, Neuropathy  11/13/2020] [3:Artery Disease, Hypertension, Diabetes, Neuropathy 11/13/2020] Date Acquired: [1:3] [2:3] [3:3] Weeks of Treatment: [1:Open] [2:Open] [3:Healed - Epithelialized] Wound Status: [1:2.5x3x2] [2:0.5x0.2x0.1] [3:0x0x0] Measurements L x W x D (cm) [1:5.89] [2:0.079] [3:0] A (cm) : rea [1:11.781] [2:0.008] [3:0] Volume (cm) : [1:-19.00%] [2:83.20%] [3:100.00%] % Reduction in A rea: [1:-2280.00%] [2:91.50%] [3:100.00%] % Reduction in Volume: [1:Grade 2] [2:Category/Stage II] [3:Category/Stage II] Classification: [1:Medium] [2:Small] [3:Small] Exudate A mount: [1:Serosanguineous] [2:Serosanguineous] [3:Serosanguineous] Exudate Type: [1:red, brown] [2:red, brown] [3:red, brown] Exudate Color: [1:Distinct, outline attached] [2:Flat and Intact] [3:Flat and Intact] Wound Margin: [1:Small (1-33%)] [2:Small (1-33%)] [3:Large (67-100%)] Granulation A mount: [1:Pink] [2:Pink] [3:Pink] Granulation Quality: [1:Large (67-100%)] [2:Large (67-100%)] [3:None Present (0%)] Necrotic A mount: [1:Eschar, Adherent Slough]  [2:Adherent Slough] [3:N/A] Necrotic Tissue: [1:Fat Layer (Subcutaneous Tissue): Yes Fat Layer (Subcutaneous Tissue): Yes Fat Layer (Subcutaneous Tissue): Yes] Exposed Structures: [1:Fascia: No Tendon: No Muscle: No Joint: No Bone: No None] [2:Fascia: No Tendon: No Muscle: No Joint: No Bone: No Small (1-33%)] [3:Fascia: No Tendon: No Muscle: No Joint: No Bone: No Large (67-100%)] Wound Number: 4 N/A N/A Photos: No Photos N/A N/A Left, Medial Lower Leg N/A N/A Wound Location: Surgical Injury N/A N/A Wounding Event: Open Surgical Wound N/A N/A Primary Etiology: Sleep Apnea, Arrhythmia, Coronary N/A N/A Comorbid History: Artery Disease, Hypertension, Peripheral Arterial Disease, Type II Diabetes, Neuropathy 11/04/2020 N/A N/A Date Acquired: 2 N/A N/A Weeks of Treatment: Open N/A N/A Wound Status: 1.1x0.2x0.2 N/A N/A Measurements L x W x D (cm) 0.173 N/A N/A A (cm) : rea 0.035 N/A N/A Volume (cm) : 60.00% N/A N/A % Reduction in Area: 18.60% N/A N/A % Reduction in Volume: Full Thickness Without Exposed N/A N/A Classification: Support Structures Medium N/A N/A Exudate A mount: Serosanguineous N/A N/A Exudate Type: red, brown N/A N/A Exudate Color: Distinct, outline attached N/A N/A Wound Margin: Small (1-33%) N/A N/A Granulation Amount: Pink N/A N/A Granulation Quality: Large (67-100%) N/A N/A Necrotic Amount: Adherent Slough N/A N/A Necrotic Tissue: Fat Layer (Subcutaneous Tissue): Yes N/A N/A Exposed Structures: Fascia: No Tendon: No Muscle: No Joint: No Bone: No Small (1-33%) N/A N/A Epithelialization: Treatment Notes Wound #1 (Foot) Wound Laterality: Right, Lateral Cleanser Soap and Water Discharge Instruction: May shower and wash wound with dial antibacterial soap and water prior to dressing change. Peri-Wound Care Topical Primary Dressing KerraCel Ag Gelling Fiber Dressing, 4x5 in (silver alginate) Discharge Instruction: Apply silver alginate  to wound bed , pack lightly into wound Secondary Dressing Woven Gauze Sponge, Non-Sterile 4x4 in Discharge Instruction: Apply over primary dressing as directed. Secured With Conforming Stretch Gauze Bandage, Sterile 2x75 (in/in) Discharge Instruction: Secure with stretch gauze as directed. Compression Wrap Compression Stockings Add-Ons Wound #2 (Coccyx) Cleanser Peri-Wound Care Topical Primary Dressing Santyl Ointment Discharge Instruction: Apply nickel thick amount to wound bed as instructed Secondary Dressing ComfortFoam Border, 4x4 in (silicone border) Discharge Instruction: Apply over primary dressing or tegadem bandiad Secured With Compression Wrap Compression Stockings Add-Ons Wound #3 (Gluteus) Wound Laterality: Left Cleanser Peri-Wound Care Topical Primary Dressing Secondary Dressing Secured With Compression Wrap Compression Stockings Add-Ons Wound #4 (Lower Leg) Wound Laterality: Left, Medial Cleanser Soap and Water Discharge Instruction: May shower and wash wound with dial antibacterial soap and water prior to dressing change. Peri-Wound Care Topical Primary Dressing Promogran Prisma Matrix, 4.34 (sq in) (  silver collagen) Discharge Instruction: Moisten collagen with saline or hydrogel Secondary Dressing ComfortFoam Border, 4x4 in (silicone border) Discharge Instruction: Apply over primary dressing as directed. Secured With Compression Wrap Compression Stockings Environmental education officer) Signed: 01/15/2021 5:36:32 PM By: Linton Ham MD Signed: 01/15/2021 5:49:38 PM By: Baruch Gouty RN, BSN Entered By: Linton Ham on 01/15/2021 12:47:52 -------------------------------------------------------------------------------- Multi-Disciplinary Care Plan Details Patient Name: Date of Service: Shane [name], [place] MES H. 01/15/2021 10:30 A M Medical Record Number: [id number] Patient Account Number: [id number] Date of Birth/Sex: Treating RN: March 15, 1941 (80  y.o. [name] Primary Care : Geoffery Lyons Other Clinician: Referring : Treating /Extender: Peter Garter, Constance Goltz in Treatment: 3 Active Inactive Abuse / Safety / Falls / Self Care Management Nursing Diagnoses: Potential for falls Goals: Patient/caregiver will verbalize/demonstrate measures taken to prevent injury and/or falls Date Initiated: 12/25/2020 Target Resolution Date: 01/22/2021 Goal Status: Active Interventions: Assess fall risk on admission and as needed Assess impairment of mobility on admission and as needed per policy Notes: Nutrition Nursing Diagnoses: Impaired glucose control: actual or potential Potential for alteratiion in Nutrition/Potential for imbalanced nutrition Goals: Patient/caregiver will maintain therapeutic glucose control Date Initiated: 12/25/2020 Target Resolution Date: 01/22/2021 Goal Status: Active Interventions: Assess HgA1c results as ordered upon admission and as needed Assess patient nutrition upon admission and as needed per policy Provide education on elevated blood sugars and impact on wound healing Treatment Activities: Patient referred to Primary Care Physician for further nutritional evaluation : 12/25/2020 Notes: Pressure Nursing Diagnoses: Knowledge deficit related to causes and risk factors for pressure ulcer development Knowledge deficit related to management of pressures ulcers Potential for impaired tissue integrity related to pressure, friction, moisture, and shear Goals: Patient/caregiver will verbalize understanding of pressure ulcer management Date Initiated: 12/25/2020 Target Resolution Date: 01/22/2021 Goal Status: Active Interventions: Assess: immobility, friction, shearing, incontinence upon admission and as needed Assess potential for pressure ulcer upon admission and as needed Provide education on pressure ulcers Notes: Tissue Oxygenation Nursing  Diagnoses: Actual ineffective tissue perfusion; peripheral (select once diagnosis is confirmed) Knowledge deficit related to disease process and management Goals: Patient/caregiver will verbalize understanding of disease process and disease management Date Initiated: 12/25/2020 Target Resolution Date: 01/22/2021 Goal Status: Active Interventions: Assess patient understanding of disease process and management upon diagnosis and as needed Assess peripheral arterial status upon admission and as needed Provide education on tissue oxygenation and ischemia Treatment Activities: T ordered outside of clinic : 12/25/2020 est Notes: Wound/Skin Impairment Nursing Diagnoses: Impaired tissue integrity Knowledge deficit related to ulceration/compromised skin integrity Goals: Patient/caregiver will verbalize understanding of skin care regimen Date Initiated: 12/25/2020 Target Resolution Date: 01/22/2021 Goal Status: Active Ulcer/skin breakdown will have a volume reduction of 30% by week 4 Date Initiated: 12/25/2020 Target Resolution Date: 01/22/2021 Goal Status: Active Interventions: Assess patient/caregiver ability to obtain necessary supplies Assess patient/caregiver ability to perform ulcer/skin care regimen upon admission and as needed Assess ulceration(s) every visit Provide education on ulcer and skin care Treatment Activities: Skin care regimen initiated : 12/25/2020 Topical wound management initiated : 12/25/2020 Notes: Electronic Signature(s) Signed: 01/15/2021 5:49:38 PM By: Baruch Gouty RN, BSN Entered By: Baruch Gouty on 01/15/2021 11:08:59 -------------------------------------------------------------------------------- Pain Assessment Details Patient Name: Date of Service: Shane [name], Shane [name] MES H. 01/15/2021 10:30 A M Medical Record Number: [id number] Patient Account Number: [id number] Date of Birth/Sex: Treating RN: [birth year] (80 y.o. Shane [name], Shane [name] Primary Care :  Geoffery Lyons Other Clinician: Referring : Treating /Extender: Peter Garter, Constance Goltz  in Treatment: 3 Active Problems Location of Pain Severity and Description of Pain Patient Has Paino No Site Locations Pain Management and Medication Current Pain Management: Electronic Signature(s) Signed: 01/15/2021 5:40:25 PM By: Rhae Hammock RN Entered By: Rhae Hammock on 01/15/2021 10:34:13 -------------------------------------------------------------------------------- Patient/Caregiver Education Details Patient Name: Date of Service: Shane [name] MES H. 2/11/2022andnbsp10:30 [place] Record Number: [id number] Patient Account Number: [id number] Date of Birth/Gender: Treating RN: Jun 20, 1941 (80 y.o. [name] Primary Care Physician: Geoffery Lyons Other Clinician: Referring Physician: Treating Physician/Extender: Inetta Fermo in Treatment: 3 Education Assessment Education Provided To: Patient Education Topics Provided Elevated Blood Sugar/ Impact on Healing: Methods: Explain/Verbal Responses: Reinforcements needed, State content correctly Pressure: Methods: Explain/Verbal Responses: Reinforcements needed, State content correctly Wound/Skin Impairment: Methods: Explain/Verbal Responses: Reinforcements needed, State content correctly Electronic Signature(s) Signed: 01/15/2021 5:49:38 PM By: Baruch Gouty RN, BSN Entered By: Baruch Gouty on 01/15/2021 11:10:33 -------------------------------------------------------------------------------- Wound Assessment Details Patient Name: Date of Service: Shane [name], Shane [name] MES H. 01/15/2021 10:30 A M Medical Record Number: [id number] Patient Account Number: [id number] Date of Birth/Sex: Treating RN: 01-Dec-1941 (80 y.o. Shane [name], Shane [name] Primary Care : Geoffery Lyons Other Clinician: Referring : Treating /Extender: Peter Garter, Constance Goltz in Treatment: 3 Wound Status Wound Number: 1 Primary Diabetic Wound/Ulcer of the Lower Extremity Etiology: Wound Location: Right, Lateral Foot Wound Open Wounding Event: Gradually Appeared Status: Date Acquired: 12/08/2020 Comorbid Sleep Apnea, Arrhythmia, Coronary Artery Disease, Hypertension, Weeks Of Treatment: 3 History: Peripheral Arterial Disease, Type II Diabetes, Neuropathy Clustered Wound: No Photos Photo Uploaded By: Mikeal Hawthorne on 01/19/2021 14:00:02 Wound Measurements Length: (cm) 2.5 Width: (cm) 3 Depth: (cm) 2 Area: (cm) 5.89 Volume: (cm) 11.781 % Reduction in Area: -19% % Reduction in Volume: -2280% Epithelialization: None Tunneling: No Undermining: No Wound Description Classification: Grade 2 Wound Margin: Distinct, outline attached Exudate Amount: Medium Exudate Type: Serosanguineous Exudate Color: red, brown Foul Odor After Cleansing: No Slough/Fibrino Yes Wound Bed Granulation Amount: Small (1-33%) Exposed Structure Granulation Quality: Pink Fascia Exposed: No Necrotic Amount: Large (67-100%) Fat Layer (Subcutaneous Tissue) Exposed: Yes Necrotic Quality: Eschar, Adherent Slough Tendon Exposed: No Muscle Exposed: No Joint Exposed: No Bone Exposed: No Treatment Notes Wound #1 (Foot) Wound Laterality: Right, Lateral Cleanser Soap and Water Discharge Instruction: May shower and wash wound with dial antibacterial soap and water prior to dressing change. Peri-Wound Care Topical Primary Dressing KerraCel Ag Gelling Fiber Dressing, 4x5 in (silver alginate) Discharge Instruction: Apply silver alginate to wound bed , pack lightly into wound Secondary Dressing Woven Gauze Sponge, Non-Sterile 4x4 in Discharge Instruction: Apply over primary dressing as directed. Secured With Conforming Stretch Gauze Bandage, Sterile 2x75 (in/in) Discharge Instruction: Secure with stretch gauze as directed. Compression  Wrap Compression Stockings Add-Ons Electronic Signature(s) Signed: 01/15/2021 5:40:25 PM By: Rhae Hammock RN Entered By: Rhae Hammock on 01/15/2021 10:49:49 -------------------------------------------------------------------------------- Wound Assessment Details Patient Name: Date of Service: Shane [name], Shane [name] MES H. 01/15/2021 10:30 A M Medical Record Number: [id number] Patient Account Number: [id number] Date of Birth/Sex: Treating RN: 28-Oct-1941 (80 y.o. Shane [name], Shane [name] Primary Care : Geoffery Lyons Other Clinician: Referring : Treating /Extender: Peter Garter, Constance Goltz in Treatment: 3 Wound Status Wound Number: 2 Primary Pressure Ulcer Etiology: Wound Location: Coccyx Wound Open Wounding Event: Gradually Appeared Status: Date Acquired: 11/13/2020 Comorbid Sleep Apnea, Arrhythmia, Coronary Artery Disease, Hypertension, Weeks Of Treatment: 3 History: Peripheral Arterial Disease, Type II Diabetes, Neuropathy Clustered Wound: No Photos Wound Measurements Length: (  cm) 0.5 Width: (cm) 0.2 Depth: (cm) 0.1 Area: (cm) 0.079 Volume: (cm) 0.008 % Reduction in Area: 83.2% % Reduction in Volume: 91.5% Epithelialization: Small (1-33%) Tunneling: No Undermining: No Wound Description Classification: Category/Stage II Wound Margin: Flat and Intact Exudate Amount: Small Exudate Type: Serosanguineous Exudate Color: red, brown Foul Odor After Cleansing: No Slough/Fibrino Yes Wound Bed Granulation Amount: Small (1-33%) Exposed Structure Granulation Quality: Pink Fascia Exposed: No Necrotic Amount: Large (67-100%) Fat Layer (Subcutaneous Tissue) Exposed: Yes Necrotic Quality: Adherent Slough Tendon Exposed: No Muscle Exposed: No Joint Exposed: No Bone Exposed: No Treatment Notes Wound #2 (Coccyx) Cleanser Peri-Wound Care Topical Primary Dressing Santyl Ointment Discharge Instruction: Apply nickel thick amount to  wound bed as instructed Secondary Dressing ComfortFoam Border, 4x4 in (silicone border) Discharge Instruction: Apply over primary dressing or tegadem bandiad Secured With Compression Wrap Compression Stockings Add-Ons Electronic Signature(s) Signed: 01/19/2021 3:53:01 PM By: Mikeal Hawthorne EMT/HBOT/SD Signed: 01/22/2021 5:10:15 PM By: Rhae Hammock RN Previous Signature: 01/15/2021 5:40:25 PM Version By: Rhae Hammock RN Entered By: Mikeal Hawthorne on 01/19/2021 15:00:22 -------------------------------------------------------------------------------- Wound Assessment Details Patient Name: Date of Service: Shane [name], Shane [name] MES H. 01/15/2021 10:30 A M Medical Record Number: [id number] Patient Account Number: [id number] Date of Birth/Sex: Treating RN: 1941/04/18 (80 y.o. [name] Primary Care : Geoffery Lyons Other Clinician: Referring : Treating /Extender: Peter Garter, Constance Goltz in Treatment: 3 Wound Status Wound Number: 3 Primary Pressure Ulcer Etiology: Wound Location: Left Gluteus Wound Healed - Epithelialized Wounding Event: Gradually Appeared Status: Date Acquired: 11/13/2020 Comorbid Sleep Apnea, Arrhythmia, Coronary Artery Disease, Hypertension, Weeks Of Treatment: 3 History: Peripheral Arterial Disease, Type II Diabetes, Neuropathy Clustered Wound: No Photos Photo Uploaded By: Mikeal Hawthorne on 01/19/2021 14:59:38 Wound Measurements Length: (cm) Width: (cm) Depth: (cm) Area: (cm) Volume: (cm) 0 % Reduction in Area: 100% 0 % Reduction in Volume: 100% 0 Epithelialization: Large (67-100%) 0 Tunneling: No 0 Undermining: No Wound Description Classification: Category/Stage II Wound Margin: Flat and Intact Exudate Amount: Small Exudate Type: Serosanguineous Exudate Color: red, brown Foul Odor After Cleansing: No Slough/Fibrino Yes Wound Bed Granulation Amount: Large (67-100%) Exposed  Structure Granulation Quality: Pink Fascia Exposed: No Necrotic Amount: None Present (0%) Fat Layer (Subcutaneous Tissue) Exposed: Yes Tendon Exposed: No Muscle Exposed: No Joint Exposed: No Bone Exposed: No Treatment Notes Wound #3 (Gluteus) Wound Laterality: Left Cleanser Peri-Wound Care Topical Primary Dressing Secondary Dressing Secured With Compression Wrap Compression Stockings Add-Ons Electronic Signature(s) Signed: 01/15/2021 5:49:38 PM By: Baruch Gouty RN, BSN Entered By: Baruch Gouty on 01/15/2021 11:20:10 -------------------------------------------------------------------------------- Wound Assessment Details Patient Name: Date of Service: Shane [name], Shane [name] MES H. 01/15/2021 10:30 A M Medical Record Number: [id number] Patient Account Number: [id number] Date of Birth/Sex: Treating RN: 08/14/1941 (80 y.o. Shane [name], Shane [name] Primary Care : Geoffery Lyons Other Clinician: Referring : Treating /Extender: Peter Garter, Constance Goltz in Treatment: 3 Wound Status Wound Number: 4 Primary Open Surgical Wound Etiology: Wound Location: Left, Medial Lower Leg Wound Open Wounding Event: Surgical Injury Status: Date Acquired: 11/04/2020 Comorbid Sleep Apnea, Arrhythmia, Coronary Artery Disease, Hypertension, Weeks Of Treatment: 2 History: Peripheral Arterial Disease, Type II Diabetes, Neuropathy Clustered Wound: No Photos Photo Uploaded By: Mikeal Hawthorne on 01/19/2021 14:00:19 Wound Measurements Length: (cm) 1.1 Width: (cm) 0.2 Depth: (cm) 0.2 Area: (cm) 0.173 Volume: (cm) 0.035 % Reduction in Area: 60% % Reduction in Volume: 18.6% Epithelialization: Small (1-33%) Tunneling: No Undermining: No Wound Description Classification: Full Thickness Without Exposed Support Structures Wound Margin: Distinct, outline  attached Exudate Amount: Medium Exudate Type: Serosanguineous Exudate Color: red, brown Foul Odor After  Cleansing: No Slough/Fibrino Yes Wound Bed Granulation Amount: Small (1-33%) Exposed Structure Granulation Quality: Pink Fascia Exposed: No Necrotic Amount: Large (67-100%) Fat Layer (Subcutaneous Tissue) Exposed: Yes Necrotic Quality: Adherent Slough Tendon Exposed: No Muscle Exposed: No Joint Exposed: No Bone Exposed: No Treatment Notes Wound #4 (Lower Leg) Wound Laterality: Left, Medial Cleanser Soap and Water Discharge Instruction: May shower and wash wound with dial antibacterial soap and water prior to dressing change. Peri-Wound Care Topical Primary Dressing Promogran Prisma Matrix, 4.34 (sq in) (silver collagen) Discharge Instruction: Moisten collagen with saline or hydrogel Secondary Dressing ComfortFoam Border, 4x4 in (silicone border) Discharge Instruction: Apply over primary dressing as directed. Secured With Compression Wrap Compression Stockings Environmental education officer) Signed: 01/15/2021 5:40:25 PM By: Rhae Hammock RN Entered By: Rhae Hammock on 01/15/2021 10:50:10 -------------------------------------------------------------------------------- Vitals Details Patient Name: Date of Service: Shane [name], Shane [name] MES H. 01/15/2021 10:30 A M Medical Record Number: [id number] Patient Account Number: [id number] Date of Birth/Sex: Treating RN: 11/12/1941 (80 y.o. Shane [name], Shane [name] Primary Care : Geoffery Lyons Other Clinician: Referring : Treating /Extender: Peter Garter, Constance Goltz in Treatment: 3 Vital Signs Time Taken: 10:33 Temperature (F): 98.7 Height (in): 72 Pulse (bpm): 91 Weight (lbs): 256 Respiratory Rate (breaths/min): 17 Body Mass Index (BMI): 34.7 Blood Pressure (mmHg): 137/67 Capillary Blood Glucose (mg/dl): 121 Reference Range: 80 - 120 mg / dl Electronic Signature(s) Signed: 01/15/2021 5:40:25 PM By: Rhae Hammock RN Entered By: Rhae Hammock on 01/15/2021 10:34:07

## 2021-01-15 NOTE — Progress Notes (Signed)
ZACARIAS, KRAUTER ([id number]) Visit Report for 01/15/2021 HPI Details Patient Name: Date of Service: [name] MES H. 01/15/2021 10:30 A M Medical Record Number: [id number] Patient Account Number: [id number] Date of Birth/Sex: Treating RN: March 05, 1941 (80 y.o. [name] Primary Care Provider: Geoffery Lyons Other Clinician: Referring Provider: Treating Provider/Extender: Peter Garter, Constance Goltz in Treatment: 3 History of Present Illness HPI Description: ADMISSION 12/25/2020 This is a 81 year old man who had an elective admission to [place] in early December for coronary artery disease he underwent a CABG x3. He spent in the morning amount of time in the hospital 15 days according to his wife in 10 days in the ICU but I have not researched this issue. When he left [facility] he apparently had the pressure ulcers on the coccyx and the gluteal area. He was at home for a short period of time however he required readmission to [facility] from 12/08/2020 through 12/16/2020 with increasing fatigue dyspnea fever and cough. 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 The patient is currently on Ancef. During the hospitalization he was discovered to have a low ABI of 0.62 on the right. He went for an angiogram on 12/10/2020 and the patient had stents of the SFA and popliteal lesions. At that time there was complete resolution of the stenosis/occlusion. He had a CT scan of the foot that did not show evidence of abscess or osteomyelitis. The patient is still on Ancef follows up with infectious disease next week he has a follow-up with Dr. Jerry Caras of vein and vascular on February 8. He does not complain of pain in his foot however he does have significant diabetic neuropathy. He is using Xeroform to all 3 wounds Past medical history includes type 2 diabetes, peripheral  neuropathy, paroxysmal atrial fibrillation with heart blocko Sick sinus syndrome, idiopathic pulmonary fibrosis, congestive heart failure, hypertension witho Postural hypotension, recent CABG at [facility] on 11/04/2020. He no longer has a pacemaker but has had previous cervical spine surgery with hardware at this level We did not redo his ABI in clinic today 1/28; this is a patient I admitted to the clinic last week he had exceptionally complex hospitalizations noted above firstly at [place]. 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 At some point in this he developed a necrotic wound over the right fifth metatarsal head. He underwent revascularization of the left SFA and popliteal with stents. He is still on Ancef. I was concerned about the condition of the left foot wound last time specifically the erythema extending into the forefoot however the erythema looks a lot better today. He saw Dr. Juleen China of infectious disease since he was last here and Dr. Juleen China communicated with me wondering whether we required an MRI given the fact that he had a CT scan of his foot that was negative for osteomyelitis. It is really a moot point since he has hardware in his neck from previous surgery looked at CT scans today dating back from 2019 of his C-spine. In any case I am still very concerned about the necrotic nature of the wound on the left foot. He has small areas on his coccyx left buttock. There was a new area that they discovered on the weekend a small area on the left anterior upper lower leg which apparently was a vein harvest site although I see  very little in terms of the scar. This apparently open spontaneously. As his wife works for [employer] somebody was good enough to call in some Bactroban for her and they are using this on this area 2/4; the patient was seen in follow-up by vascular this week. His ABIs were noncompressible on the right although the waveforms were  monophasic at the posterior tibial biphasic at the dorsalis pedis. They did not do a toe pressure. Noncompressible on the left also but with a TBI of 0.58 and a toe pressure of 80. Somewhat better than I was expecting. He is going to have follow-up studies in 6 months He has a completely necrotic wound over the right fifth metatarsal head. Culture of this that I did last time showed abundant Serratia and rare Enterococcus. I spoke with Dr. Juleen China by secure text message he recommended quinolones I am going to start him on Levaquin 750 mg today, sees Dr. Juleen China on Monday. 2/11; Patient was seen by Dr. Gale Journey of orthopedics notes below. 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 No abscess ; Dr. Alcario Drought conclusions; Diabetic foot ulcer ([place]) This is an ongoing and evolving issue. Currently followed by wound care and superficial culture is polymicrobial (Serratia and Enterococcus) from 01/01/21. MRI tomorrow is pending and he has been started on levaquin for the time being. I reviewed his OPAT labs and WBC has been normal, creatinine improved and CRP stable at 26 1/23 then 27 on 1/31. Labs drawn by home nurse this morning are pending. PLAN: Discussed with patient and his wife this afternoon that MRI will help determine if there is osteo. If so, favor re-evaluation from Dr Sharol Given to see about appropriate debridement/amputation as necessary Since he is otherwise well appearing and non-toxic from this foot wound, I favor holding his levaquin pending MRI and possible surgical plan/cultures. Also discussed wth pharmacy and preferred abx approach would be Bactrim/Augmentin to treat current bacteria from wound culture Will follow up after MRI and see him back next week as well. Encounter for therapeutic drug monitoring OPAT labs have been stable. PLAN: Continue monitoring. Will update Elby Showers at his PCP office [facility] for [facility] 01/11/2021, 3:24 PM The patient has also been seen by Dr. Sharol Given who plans to do a ray amputation on the right foot he is also going to do a talocalcaneal fusion. Patient states that Dr. Sharol Given did a Doppler exam the circulation and felt it was adequate to proceed this is booked for next week. The patient also has wounds on the coccyx and an area on the left anterior upper tibia area. Electronic Signature(s) Signed: 01/15/2021 5:36:32 PM By: Linton Ham MD Entered By: Linton Ham on 01/15/2021 12:49:13 -------------------------------------------------------------------------------- Physical Exam Details Patient Name: Date of Service: [name], [name] MES H. 01/15/2021 10:30 A M Medical Record Number: [id number] Patient Account Number: [id number] Date of Birth/Sex: Treating RN: May 29, 1941 (80 y.o. [name] Primary Care Provider: Geoffery Lyons Other Clinician: Referring Provider: Treating Provider/Extender: Peter Garter, Constance Goltz in Treatment: 3 Constitutional Sitting or standing Blood Pressure is within target range for patient.. Pulse regular and within target range for patient.Marland Kitchen Respirations regular, non-labored and within target range.. Temperature is normal and within the target range for the patient.Marland Kitchen Appears in no distress. Cardiovascular Pedal pulses absent bilaterally.. Notes Wound exam The area on the right lateral fifth  metatarsal head is necrotic I did not debride this or probed this. The last time we looked at this this extended under the metatarsals. Electronic Signature(s) Signed: 01/15/2021 5:36:32 PM By: Linton Ham MD Entered By: Linton Ham on 01/15/2021 12:50:46 -------------------------------------------------------------------------------- Physician Orders Details Patient Name: Date of Service: [name], [name] MES H. 01/15/2021 10:30 A M Medical Record Number:  [id number] Patient Account Number: [id number] Date of Birth/Sex: Treating RN: October 22, 1941 (80 y.o. [name] Primary Care Provider: Geoffery Lyons Other Clinician: Referring Provider: Treating Provider/Extender: Peter Garter, Constance Goltz in Treatment: 3 Verbal / Phone Orders: No Diagnosis Coding ICD-10 Coding Code Description E11.621 Type 2 diabetes mellitus with foot ulcer E11.51 Type 2 diabetes mellitus with diabetic peripheral angiopathy without gangrene L97.518 Non-pressure chronic ulcer of other part of right foot with other specified severity L89.152 Pressure ulcer of sacral region, stage 2 L89.322 Pressure ulcer of left buttock, stage 2 A49.01 Methicillin susceptible Staphylococcus aureus infection, unspecified site L97.821 Non-pressure chronic ulcer of other part of left lower leg limited to breakdown of skin L03.115 Cellulitis of right lower limb Follow-up Appointments Return appointment in 1 month. - call to cancel if healed Bathing/ Shower/ Hygiene May shower and wash wound with soap and water. - on days when dressings are changed Edema Control - Lymphedema / SCD / Other Elevate legs to the level of the heart or above for 30 minutes daily and/or when sitting, a frequency of: Avoid standing for long periods of time. Exercise regularly Moisturize legs daily. Off-Loading Open toe surgical shoe to: - right foot Turn and reposition every 2 hours - avoid slouching in chair when sitting, weight should be on back of thighs [facility] wound care orders this week; continue Home Health for wound care. May utilize formulary equivalent dressing for wound treatment orders unless otherwise specified. - silver collagen to left leg Other Home Health Orders/Instructions: - [facility] Wound Treatment Wound #1 - Foot Wound Laterality: Right, Lateral Cleanser: Soap and Water Every Other Day/30 Days Discharge Instructions: May shower and wash wound with dial  antibacterial soap and water prior to dressing change. Prim Dressing: KerraCel Ag Gelling Fiber Dressing, 4x5 in (silver alginate) (Home Health) Every Other Day/30 Days ary Discharge Instructions: Apply silver alginate to wound bed , pack lightly into wound Secondary Dressing: Woven Gauze Sponge, Non-Sterile 4x4 in [facility]) Every Other Day/30 Days Discharge Instructions: Apply over primary dressing as directed. Secured With: Child psychotherapist, Sterile 2x75 (in/in) (Home Health) Every Other Day/30 Days Discharge Instructions: Secure with stretch gauze as directed. Wound #2 - Coccyx Prim Dressing: Santyl Ointment (Home Health) 1 x Per Day/30 Days ary Discharge Instructions: Apply nickel thick amount to wound bed as instructed Secondary Dressing: ComfortFoam Border, 4x4 in (silicone border) ([place]) 1 x Per Day/30 Days Discharge Instructions: Apply over primary dressing or tegadem bandiad Wound #4 - Lower Leg Wound Laterality: Left, Medial Cleanser: Soap and Water Every Other Day/30 Days Discharge Instructions: May shower and wash wound with dial antibacterial soap and water prior to dressing change. Prim Dressing: Promogran Prisma Matrix, 4.34 (sq in) (silver collagen) (Home Health) Every Other Day/30 Days ary Discharge Instructions: Moisten collagen with saline or hydrogel Secondary Dressing: ComfortFoam Border, 4x4 in (silicone border) [facility]) Every Other Day/30 Days Discharge Instructions: Apply over primary dressing as directed. Electronic Signature(s) Signed: 01/15/2021 5:36:32 PM By: Linton Ham MD Signed: 01/15/2021 5:49:38 PM By: Baruch Gouty RN, BSN Entered By: Baruch Gouty on 01/15/2021 11:25:18 --------------------------------------------------------------------------------  Problem List Details Patient Name: Date of Service: [name] MES H. 01/15/2021 10:30 A M Medical Record Number: [id number] Patient Account Number: [id number] Date of  Birth/Sex: Treating RN: 11/09/1941 (80 y.o. [name] Primary Care Provider: Geoffery Lyons Other Clinician: Referring Provider: Treating Provider/Extender: Peter Garter, Constance Goltz in Treatment: 3 Active Problems ICD-10 Encounter Code Description Active Date MDM Diagnosis E11.621 Type 2 diabetes mellitus with foot ulcer 12/25/2020 No Yes E11.51 Type 2 diabetes mellitus with diabetic peripheral angiopathy without gangrene 12/25/2020 No Yes L97.518 Non-pressure chronic ulcer of other part of right foot with other specified 12/25/2020 No Yes severity L89.152 Pressure ulcer of sacral region, stage 2 12/25/2020 No Yes L89.322 Pressure ulcer of left buttock, stage 2 12/25/2020 No Yes A49.01 Methicillin susceptible Staphylococcus aureus infection, unspecified site 12/25/2020 No Yes L97.821 Non-pressure chronic ulcer of other part of left lower leg limited to breakdown 01/01/2021 No Yes of skin L03.115 Cellulitis of right lower limb 01/08/2021 No Yes Inactive Problems Resolved Problems Electronic Signature(s) Signed: 01/15/2021 5:36:32 PM By: Linton Ham MD Entered By: Linton Ham on 01/15/2021 12:47:39 -------------------------------------------------------------------------------- Progress Note Details Patient Name: Date of Service: [name], [name] MES H. 01/15/2021 10:30 A M Medical Record Number: [id number] Patient Account Number: [id number] Date of Birth/Sex: Treating RN: 16-Jun-1941 (80 y.o. [name] Primary Care Provider: Geoffery Lyons Other Clinician: Referring Provider: Treating Provider/Extender: Peter Garter, Constance Goltz in Treatment: 3 Subjective History of Present Illness (HPI) ADMISSION 12/25/2020 This is a 80 year old man who had an elective admission to [place] in early December for coronary artery disease he underwent a CABG x3. He spent in the morning amount of time in the hospital 15 days according to his wife in  10 days in the ICU but I have not researched this issue. When he left [facility] he apparently had the pressure ulcers on the coccyx and the gluteal area. He was at home for a short period of time however he required readmission to [facility] from 12/08/2020 through 12/16/2020 with increasing fatigue dyspnea fever and cough. 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 The patient is currently on Ancef. During the hospitalization he was discovered to have a low ABI of 0.62 on the right. He went for an angiogram on 12/10/2020 and the patient had stents of the SFA and popliteal lesions. At that time there was complete resolution of the stenosis/occlusion. He had a CT scan of the foot that did not show evidence of abscess or osteomyelitis. The patient is still on Ancef follows up with infectious disease next week he has a follow-up with Dr. Jerry Caras of vein and vascular on February 8. He does not complain of pain in his foot however he does have significant diabetic neuropathy. He is using Xeroform to all 3 wounds Past medical history includes type 2 diabetes, peripheral neuropathy, paroxysmal atrial fibrillation with heart blocko Sick sinus syndrome, idiopathic pulmonary fibrosis, congestive heart failure, hypertension witho Postural hypotension, recent CABG at [facility] on 11/04/2020. He no longer has a pacemaker but has had previous cervical spine surgery with hardware at this level We did not redo his ABI in clinic today 1/28; this is a patient I admitted to the clinic last week he had exceptionally complex hospitalizations noted above firstly at [place]. 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 At some  point in this he developed a necrotic wound over the right fifth metatarsal head. He underwent revascularization of the left SFA and popliteal with stents. He is still  on Ancef. I was concerned about the condition of the left foot wound last time specifically the erythema extending into the forefoot however the erythema looks a lot better today. He saw Dr. Juleen China of infectious disease since he was last here and Dr. Juleen China communicated with me wondering whether we required an MRI given the fact that he had a CT scan of his foot that was negative for osteomyelitis. It is really a moot point since he has hardware in his neck from previous surgery looked at CT scans today dating back from 2019 of his C-spine. In any case I am still very concerned about the necrotic nature of the wound on the left foot. He has small areas on his coccyx left buttock. There was a new area that they discovered on the weekend a small area on the left anterior upper lower leg which apparently was a vein harvest site although I see very little in terms of the scar. This apparently open spontaneously. As his wife works for [employer] somebody was good enough to call in some Bactroban for her and they are using this on this area 2/4; the patient was seen in follow-up by vascular this week. His ABIs were noncompressible on the right although the waveforms were monophasic at the posterior tibial biphasic at the dorsalis pedis. They did not do a toe pressure. Noncompressible on the left also but with a TBI of 0.58 and a toe pressure of 80. Somewhat better than I was expecting. He is going to have follow-up studies in 6 months He has a completely necrotic wound over the right fifth metatarsal head. Culture of this that I did last time showed abundant Serratia and rare Enterococcus. I spoke with Dr. Juleen China by secure text message he recommended quinolones I am going to start him on Levaquin 750 mg today, sees Dr. Juleen China on Monday. 2/11; Patient was seen by Dr. Gale Journey of orthopedics notes below. He has also had his MRI MRI REPORT findings consistent with osteomyelitis in the head and neck of the  fifth metatarsal. No abscess ; Dr. Alcario Drought conclusions; Diabetic foot ulcer ([place]) This is an ongoing and evolving issue. Currently followed by wound care and superficial culture is polymicrobial (Serratia and Enterococcus) from 01/01/21. MRI tomorrow is pending and he has been started on levaquin for the time being. I reviewed his OPAT labs and WBC has been normal, creatinine improved and CRP stable at 26 1/23 then 27 on 1/31. Labs drawn by home nurse this morning are pending. PLAN:  Discussed with patient and his wife this afternoon that MRI will help determine if there is osteo. If so, favor re-evaluation from Dr Sharol Given to see about appropriate debridement/amputation as necessary  Since he is otherwise well appearing and non-toxic from this foot wound, I favor holding his levaquin pending MRI and possible surgical plan/cultures. Also discussed wth pharmacy and preferred abx approach would be Bactrim/Augmentin to treat current bacteria from wound culture  Will follow up after MRI and see him back next week as well. Encounter for therapeutic drug monitoring OPAT labs have been stable. PLAN:  Continue monitoring. Will update Elby Showers at his PCP office [facility] for Infectious [facility] 01/11/2021, 3:24 PM The patient has also been seen by Dr. Sharol Given who plans to do  a ray amputation on the right foot he is also going to do a talocalcaneal fusion. Patient states that Dr. Sharol Given did a Doppler exam the circulation and felt it was adequate to proceed this is booked for next week. The patient also has wounds on the coccyx and an area on the left anterior upper tibia area. Objective Constitutional Sitting or standing Blood Pressure is within target range for patient.. Pulse regular and within target range for patient.Marland Kitchen Respirations regular, non-labored and within target range.. Temperature is normal and within the target range for the patient.Marland Kitchen  Appears in no distress. Vitals Time Taken: 10:33 AM, Height: 72 in, Weight: 256 lbs, BMI: 34.7, Temperature: 98.7 F, Pulse: 91 bpm, Respiratory Rate: 17 breaths/min, Blood Pressure: 137/67 mmHg, Capillary Blood Glucose: 121 mg/dl. Cardiovascular Pedal pulses absent bilaterally.. General Notes: Wound exam ooThe area on the right lateral fifth metatarsal head is necrotic I did not debride this or probed this. The last time we looked at this this extended under the metatarsals. Integumentary (Hair, Skin) Wound #1 status is Open. Original cause of wound was Gradually Appeared. The wound is located on the Right,Lateral Foot. The wound measures 2.5cm length x 3cm width x 2cm depth; 5.89cm^2 area and 11.781cm^3 volume. There is Fat Layer (Subcutaneous Tissue) exposed. There is no tunneling or undermining noted. There is a medium amount of serosanguineous drainage noted. The wound margin is distinct with the outline attached to the wound base. There is small (1-33%) pink granulation within the wound bed. There is a large (67-100%) amount of necrotic tissue within the wound bed including Eschar and Adherent Slough. Wound #2 status is Open. Original cause of wound was Gradually Appeared. The wound is located on the Coccyx. The wound measures 0.5cm length x 0.2cm width x 0.1cm depth; 0.079cm^2 area and 0.008cm^3 volume. There is Fat Layer (Subcutaneous Tissue) exposed. There is no tunneling or undermining noted. There is a small amount of serosanguineous drainage noted. The wound margin is flat and intact. There is small (1-33%) pink granulation within the wound bed. There is a large (67-100%) amount of necrotic tissue within the wound bed including Adherent Slough. Wound #3 status is Healed - Epithelialized. Original cause of wound was Gradually Appeared. The wound is located on the Left Gluteus. The wound measures 0cm length x 0cm width x 0cm depth; 0cm^2 area and 0cm^3 volume. There is Fat Layer  (Subcutaneous Tissue) exposed. There is no tunneling or undermining noted. There is a small amount of serosanguineous drainage noted. The wound margin is flat and intact. There is large (67-100%) pink granulation within the wound bed. There is no necrotic tissue within the wound bed. Wound #4 status is Open. Original cause of wound was Surgical Injury. The wound is located on the Left,Medial Lower Leg. The wound measures 1.1cm length x 0.2cm width x 0.2cm depth; 0.173cm^2 area and 0.035cm^3 volume. There is Fat Layer (Subcutaneous Tissue) exposed. There is no tunneling or undermining noted. There is a medium amount of serosanguineous drainage noted. The wound margin is distinct with the outline attached to the wound base. There is small (1-33%) pink granulation within the wound bed. There is a large (67-100%) amount of necrotic tissue within the wound bed including Adherent Slough. Assessment Active Problems ICD-10 Type 2 diabetes mellitus with foot ulcer Type 2 diabetes mellitus with diabetic peripheral angiopathy without gangrene Non-pressure chronic ulcer of other part of right foot with other specified severity Pressure ulcer of sacral region, stage 2 Pressure ulcer of left  buttock, stage 2 Methicillin susceptible Staphylococcus aureus infection, unspecified site Non-pressure chronic ulcer of other part of left lower leg limited to breakdown of skin Cellulitis of right lower limb Plan Follow-up Appointments: Return appointment in 1 month. - call to cancel if healed Bathing/ Shower/ Hygiene: May shower and wash wound with soap and water. - on days when dressings are changed Edema Control - Lymphedema / SCD / Other: Elevate legs to the level of the heart or above for 30 minutes daily and/or when sitting, a frequency of: Avoid standing for long periods of time. Exercise regularly Moisturize legs daily. Off-Loading: Open toe surgical shoe to: - right foot Turn and reposition every 2  hours - avoid slouching in chair when sitting, weight should be on back of thighs Home Health: New wound care orders this week; continue Home Health for wound care. May utilize formulary equivalent dressing for wound treatment orders unless otherwise specified. - silver collagen to left leg Other Home Health Orders/Instructions: Alvis Lemmings WOUND #1: - Foot Wound Laterality: Right, Lateral Cleanser: Soap and Water Every Other Day/30 Days Discharge Instructions: May shower and wash wound with dial antibacterial soap and water prior to dressing change. Prim Dressing: KerraCel Ag Gelling Fiber Dressing, 4x5 in (silver alginate) (Home Health) Every Other Day/30 Days ary Discharge Instructions: Apply silver alginate to wound bed , pack lightly into wound Secondary Dressing: Woven Gauze Sponge, Non-Sterile 4x4 in [facility]) Every Other Day/30 Days Discharge Instructions: Apply over primary dressing as directed. Secured With: Child psychotherapist, Sterile 2x75 (in/in) (Home Health) Every Other Day/30 Days Discharge Instructions: Secure with stretch gauze as directed. WOUND #2: - Coccyx Wound Laterality: Prim Dressing: Santyl Ointment (Home Health) 1 x Per Day/30 Days ary Discharge Instructions: Apply nickel thick amount to wound bed as instructed Secondary Dressing: ComfortFoam Border, 4x4 in (silicone border) ([place]) 1 x Per Day/30 Days Discharge Instructions: Apply over primary dressing or tegadem bandiad WOUND #4: - Lower Leg Wound Laterality: Left, Medial Cleanser: Soap and Water Every Other Day/30 Days Discharge Instructions: May shower and wash wound with dial antibacterial soap and water prior to dressing change. Prim Dressing: Promogran Prisma Matrix, 4.34 (sq in) (silver collagen) (Home Health) Every Other Day/30 Days ary Discharge Instructions: Moisten collagen with saline or hydrogel Secondary Dressing: ComfortFoam Border, 4x4 in (silicone border) [facility]) Every  Other Day/30 Days Discharge Instructions: Apply over primary dressing as directed. 1. 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 He has necrotic wound over the right lateral fifth metatarsal head. I am certainly in agreement with the idea of a ray amputation. Specifically the patient wants to proceed with this as quickly as possible. 2. He is also going to have a fusion of the ankle. 3. The patient had a complex revascularization on the right leg. His last arterial studies on 01/05/2021 by Dr. Carlis Abbott showed noncompressible ABIs with a monophasic waveform at the posterior tibial biphasic at the dorsalis pedis on the right. I might of like to have Dr. Carlis Abbott weigh in on this. 4. He has a small area on his lower coccyx remaining the area on the buttock is healed were using Santyl here. I asked him to use Prisma on the area on the upper leg, left medial 5. I have asked him to call us back if we if they think we can be of more service. Electronic Signature(s) Signed: 01/15/2021 5:36:32 PM By: Linton Ham MD Entered By: Linton Ham on 01/15/2021 12:55:33 -------------------------------------------------------------------------------- SuperBill Details Patient Name:  Date of Service: [name] MES H. 01/15/2021 Medical Record Number: [id number] Patient Account Number: [id number] Date of Birth/Sex: Treating RN: 1940-12-10 (80 y.o. [name] Primary Care Provider: Geoffery Lyons Other Clinician: Referring Provider: Treating Provider/Extender: Peter Garter, Constance Goltz in Treatment: 3 Diagnosis Coding ICD-10 Codes Code Description [phone] Type 2 diabetes mellitus with foot ulcer E11.51 Type 2 diabetes mellitus with diabetic peripheral angiopathy without gangrene L97.518 Non-pressure chronic ulcer of other part of right foot with other specified severity L89.152 Pressure ulcer of sacral region, stage 2 L89.322 Pressure ulcer of left buttock, stage 2 A49.01  Methicillin susceptible Staphylococcus aureus infection, unspecified site L97.821 Non-pressure chronic ulcer of other part of left lower leg limited to breakdown of skin L03.115 Cellulitis of right lower limb Facility Procedures CPT4 Code: 97416384 Description: 53646 - WOUND CARE VISIT-LEV 5 EST PT Modifier: Quantity: 1 Electronic Signature(s) Signed: 01/15/2021 5:36:32 PM By: Linton Ham MD Signed: 01/15/2021 5:49:38 PM By: Baruch Gouty RN, BSN Entered By: Baruch Gouty on 01/15/2021 11:54:20

## 2021-01-18 ENCOUNTER — Encounter (HOSPITAL_COMMUNITY): Payer: Self-pay

## 2021-01-18 ENCOUNTER — Ambulatory Visit (HOSPITAL_COMMUNITY): Payer: Medicare Other

## 2021-01-18 ENCOUNTER — Other Ambulatory Visit: Payer: Self-pay | Admitting: Physician Assistant

## 2021-01-18 ENCOUNTER — Encounter: Payer: Self-pay | Admitting: Orthopedic Surgery

## 2021-01-18 NOTE — Progress Notes (Signed)
Office Visit Note   Patient: Shane [name]           Date of Birth: [birth year]           MRN: [id number] Visit Date: 01/14/2021              Requested by: Mignon Pine, DO [address] [place],  [place] [address] PCP: Burnard Bunting, MD  Chief Complaint  Patient presents with  . Right Foot - Open Wound, Wound Check      HPI: Patient is a 80 year old gentleman who is seen for evaluation of the right foot osteomyelitis little toe.  Patient is status post revascularization by [name].  Patient is status post CABG at [facility] in December he developed endocarditis and is currently on a PICC line that he will complete February 21.  Assessment & Plan: Visit Diagnoses:  1. Osteomyelitis of fifth toe of right foot ([place])     Plan: With patient's cavovarus fixed foot drop and osteomyelitis of the fifth ray patient will need 1/5 ray amputation.  With the foot drop patient will need a plantigrade foot and will plan for tibial calcaneal fusion at the same time.  Risks and benefits were discussed including infection neurovascular injury nonhealing of the fusion nonhealing the amputation need for additional surgery.  Patient states he understands wished to proceed at this time.  Follow-Up Instructions: Return in about 2 weeks (around 01/28/2021).   Ortho Exam  Patient is alert, oriented, no adenopathy, well-dressed, normal affect, normal respiratory effort. Examination patient does have biphasic pulses by Doppler at the ankle.  His dorsalis pedis pulse.  He has a necrotic ulcer over the fifth metatarsal head with an MRI scan showing osteomyelitis.  His ankle-brachial indices showed noncompressible vessels he has had a pacemaker removed.  Examination of the foot patient has a cavovarus fixed foot drop deformity essentially ambulating on the lateral aspect of his foot he states that this nerve injury was secondary to cervical spine fusion.  He does have venous swelling of the  right foot.  He states he has been diabetic for 40 years does not smoke he states his last hemoglobin A1c is 7.  Imaging: No results found. No images are attached to the encounter.  Labs: Lab Results  Component Value Date   HGBA1C 6.6 (H) 12/09/2020   HGBA1C 7.6 (H) 03/28/2020   HGBA1C 7.9 (H) 12/19/2016   ESRSEDRATE 22 (H) 10/14/2016   CRP 8.3 (H) 12/09/2020   CRP 4.1 (H) 07/17/2020   REPTSTATUS 01/07/2021 FINAL 01/01/2021   GRAMSTAIN  01/01/2021    NO WBC SEEN ABUNDANT GRAM POSITIVE COCCI Performed at [facility], [place] [address], [place], [place] [address]    CULT  01/01/2021    ABUNDANT SERRATIA MARCESCENS RARE ENTEROCOCCUS FAECALIS MIXED ANAEROBIC FLORA PRESENT.  CALL LAB IF FURTHER IID REQUIRED.    LABORGA SERRATIA MARCESCENS 01/01/2021   LABORGA ENTEROCOCCUS FAECALIS 01/01/2021     Lab Results  Component Value Date   ALBUMIN 2.0 (L) 12/16/2020   ALBUMIN 2.5 (L) 12/09/2020   ALBUMIN 3.0 (L) 12/08/2020    Lab Results  Component Value Date   MG 1.7 12/16/2020   MG 1.9 12/09/2020   MG 1.7 12/08/2020   No results found for: VD25OH  No results found for: PREALBUMIN CBC EXTENDED Latest Ref Rng & Units 12/16/2020 12/15/2020 12/14/2020  WBC 4.0 - 10.5 K/uL 7.9 9.2 9.4  RBC 4.22 - 5.81 MIL/uL 2.83(L) 2.72(L) 2.96(L)  HGB 13.0 - 17.0 g/dL 8.8(L) 8.4(L) 9.1(L)  HCT 39.0 - 52.0 % 26.9(L) 25.7(L) 28.0(L)  PLT 150 - 400 K/uL 242 204 203  NEUTROABS 1.7 - 7.7 K/uL 5.4 - -  LYMPHSABS 0.7 - 4.0 K/uL 1.1 - -     There is no height or weight on file to calculate BMI.  Orders:  No orders of the defined types were placed in this encounter.  No orders of the defined types were placed in this encounter.    Procedures: No procedures performed  Clinical Data: No additional findings.  ROS:  All other systems negative, except as noted in the HPI. Review of Systems  Objective: Vital Signs: There were no vitals taken for this visit.  Specialty Comments:  No  specialty comments available.  PMFS History: Patient Active Problem List   Diagnosis Date Noted  . Peripheral vascular disease ([place]) 12/29/2020  . CHF (congestive heart failure), NYHA class II, chronic, diastolic ([place]) 12/13/3233  . History of COVID-19 12/22/2020  . SSS (sick sinus syndrome) ([place]) 12/22/2020  . Acute bacterial endocarditis   . MSSA bacteremia 12/09/2020  . Diabetic foot ulcer ([place]) 12/09/2020  . Foot drop, right   . Generalized weakness 12/08/2020  . Dehydration with hyponatremia 12/08/2020  . Atrial fibrillation, chronic ([place]) 12/08/2020  . Elevated troponin level not due myocardial infarction 12/08/2020  . Adrenal insufficiency ([place]) 11/14/2020  . Orthostatic hypotension 11/14/2020  . Physical deconditioning 11/14/2020  . Difficulty sleeping 11/07/2020  . Postoperative anemia due to acute blood loss 11/07/2020  . Right ventricular dysfunction 11/05/2020  . 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 Hearing loss 11/03/2020  . Cardiac device in situ 09/09/2020  . Coronary atherosclerosis due to lipid rich plaque 09/02/2020  . COVID-19 virus infection 07/18/2020  . Coronary artery disease involving native heart without angina pectoris 07/10/2020  . Chronic kidney disease, stage 3a ([place]) 03/29/2020  . 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 ([place]) 03/16/2020  . Right epiretinal membrane 03/16/2020  . Posterior vitreous detachment of right eye 03/16/2020  . Aortic stenosis, moderate 03/12/2020  . 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 Near syncope 04/27/2018  . Hypoglycemia due to insulin 01/06/2018  . Essential hypertension 01/06/2018  . Diabetes mellitus type 2, with complication, on long term insulin pump ([place]) 01/06/2018  . Syncope and collapse 01/05/2018   . Encounter for therapeutic drug monitoring 06/29/2017  . OSA on CPAP 05/05/2017  . 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[place]) 01/05/2017  . Abnormal chest x-ray 10/12/2016  . Benign neoplasm of colon 01/14/2013   Past Medical History:  Diagnosis Date  . 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 Cataract   . Chronic kidney disease    d/t DM  . CKD (chronic kidney disease), stage III ([place])   . Diabetes mellitus    Vgo disposal insulin bolus  simular to insulin pump  . Dyspnea   . GERD (gastroesophageal reflux disease)   . Hyperlipidemia   . Hypertension   . Idiopathic pulmonary fibrosis ([place]) 11/2016  . ILD (interstitial lung disease) ([place])   . Moderate aortic stenosis    a. 10/2019 Echo: EF 55-60%, Gr2 DD. Nl RV.   Marland Kitchen Neuromuscular disorder ([place])   . Nonobstructive CAD (coronary artery disease)    a. 2012 Cath: mod, nonobs dzs;  b. 10/2016 MV: EF 60%, no ischemia.  . OSA on CPAP 05/05/2017   Unattended Home Sleep Test 7/2/813-AHI 38.6/hour, desaturation to 64%, body weight 261 pounds  . PONV (postoperative nausea and vomiting)   . Sleep apnea     uses cpap asked to bring mask and tubing    Family History  Problem Relation Age of Onset  . Diabetes Mellitus II Mother   . Emphysema Father 42  . Heart attack Father   . Colon cancer Neg Hx   . Esophageal cancer Neg Hx   . Rectal cancer Neg Hx   . Stomach cancer Neg Hx     Past Surgical History:  Procedure Laterality Date  . ABDOMINAL AORTOGRAM W/LOWER EXTREMITY N/A 12/10/2020   Procedure: ABDOMINAL AORTOGRAM W/LOWER EXTREMITY;  Surgeon: Cherre Robins, MD;  Location: [facility];  Service: Cardiovascular;  Laterality: N/A;  . ANTERIOR FUSION CERVICAL SPINE  2012  . CARDIAC CATHETERIZATION  2011  . CARDIAC CATHETERIZATION N/A 11/09/2016   Procedure: Right Heart Cath;  Surgeon: Belva Crome, MD;  Location: [facility];  Service: Cardiovascular;  Laterality: N/A;  . carpel tunnel     left wrist  . CATARACT EXTRACTION    .  CATARACT EXTRACTION W/ INTRAOCULAR LENS  IMPLANT, BILATERAL  2013  . CERVICAL LAMINECTOMY  2012  . COLONOSCOPY N/A 01/14/2013   Procedure: COLONOSCOPY;  Surgeon: Irene Shipper, MD;  Location: [facility];  Service: Endoscopy;  Laterality: N/A;  . CORONARY ARTERY BYPASS GRAFT  11/04/2020   LIMA-LAD, SVG-OM1, SVG-PDA (Dr Marney [facility]) dc 11/18/2020  . EYE SURGERY    . [place]   left  . LEFT HEART CATH AND CORONARY ANGIOGRAPHY N/A 07/10/2020   Procedure: LEFT HEART CATH AND CORONARY ANGIOGRAPHY;  Surgeon: Sherren Mocha, MD;  Location: [place];  Service: Cardiovascular;  Laterality: N/A;  . LUMBAR LAMINECTOMY  2003  . LUNG BIOPSY Left 12/26/2016   Procedure: LUNG BIOPSY;  Surgeon: [name], MD;  Location: [place] [place];  Service: Thoracic;  Laterality: Left;  . PACEMAKER IMPLANT N/A 03/30/2020   Procedure: PACEMAKER IMPLANT;  Surgeon: Evans Lance, MD;  Location: [place];  Service: Cardiovascular;  Laterality: N/A;  . PERIPHERAL VASCULAR INTERVENTION Right 12/10/2020   Procedure: PERIPHERAL VASCULAR INTERVENTION;  Surgeon: Cherre Robins, MD;  Location: [place] [facility];  Service: Cardiovascular;  Laterality: Right;  SFA  . POSTERIOR FUSION CERVICAL SPINE  2012  . PPM GENERATOR REMOVAL N/A 12/14/2020   Procedure: PPM GENERATOR REMOVAL;  Surgeon: Evans Lance, MD;  Location: [place] [place] [facility];  Service: Cardiovascular;  Laterality: N/A;  . TEE WITHOUT CARDIOVERSION N/A 12/11/2020   Procedure: TRANSESOPHAGEAL ECHOCARDIOGRAM (TEE);  Surgeon: Geralynn Rile, MD;  Location: [facility] [place];  Service: Cardiovascular;  Laterality: N/A;  . TRIGGER FINGER RELEASE  2011   4th finger left hand  . VIDEO ASSISTED THORACOSCOPY Left 12/26/2016   Procedure: VIDEO ASSISTED THORACOSCOPY;  Surgeon: [name], MD;  Location: [facility];  Service: Thoracic;  Laterality: Left;  Marland Kitchen VIDEO BRONCHOSCOPY N/A 12/26/2016   Procedure: VIDEO BRONCHOSCOPY;  Surgeon: [name], MD;  Location: [place];  Service: Thoracic;  Laterality: N/A;   Social History   Occupational History  . Not on file  Tobacco Use  . Smoking status: Never Smoker  . Smokeless tobacco: Never Used  Vaping Use  . Vaping Use: Never used  Substance and Sexual  Activity  . Alcohol use: No  . Drug use: No  . Sexual activity: Not Currently

## 2021-01-19 ENCOUNTER — Ambulatory Visit (INDEPENDENT_AMBULATORY_CARE_PROVIDER_SITE_OTHER): Payer: Medicare Other | Admitting: Internal Medicine

## 2021-01-19 ENCOUNTER — Other Ambulatory Visit: Payer: Self-pay

## 2021-01-19 ENCOUNTER — Encounter: Payer: Self-pay | Admitting: Internal Medicine

## 2021-01-19 DIAGNOSIS — R7881 Bacteremia: Secondary | ICD-10-CM

## 2021-01-19 DIAGNOSIS — E13621 Other specified diabetes mellitus with foot ulcer: Secondary | ICD-10-CM | POA: Diagnosis not present

## 2021-01-19 DIAGNOSIS — B9561 Methicillin susceptible Staphylococcus aureus infection as the cause of diseases classified elsewhere: Secondary | ICD-10-CM

## 2021-01-19 DIAGNOSIS — I38 Endocarditis, valve unspecified: Secondary | ICD-10-CM | POA: Insufficient documentation

## 2021-01-19 DIAGNOSIS — I739 Peripheral vascular disease, unspecified: Secondary | ICD-10-CM | POA: Diagnosis not present

## 2021-01-19 DIAGNOSIS — I330 Acute and subacute infective endocarditis: Secondary | ICD-10-CM | POA: Diagnosis not present

## 2021-01-19 DIAGNOSIS — L97519 Non-pressure chronic ulcer of other part of right foot with unspecified severity: Secondary | ICD-10-CM

## 2021-01-19 NOTE — Assessment & Plan Note (Signed)
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  PLAN: . Continue cefazolin as planned.  Tentative end date remains 01/25/21 but ultimately will be decided after his surgical procedure on 01/22/21 with Dr Sharol Given.  If good source control obtained then likely can stop abx as planned.

## 2021-01-19 NOTE — Assessment & Plan Note (Signed)
This is an ongoing issue.  MRI obtained showed findings consistent with OM.  He was seen by Dr Sharol Given last week who recommended ray amputation scheduled for 01/22/21.  PLAN: . Agree with surgery for definitive source control . Continue cefazolin for MSSA infection . Will continue to hold empiric therapy to cover superficial wound coverage given its unclear significance

## 2021-01-19 NOTE — Assessment & Plan Note (Signed)
Status post intervention during hospitalization with improved blood flow.  PLAN: . Continued vascular follow up

## 2021-01-19 NOTE — Patient Instructions (Signed)
Thank you for coming to see me today. It was a pleasure seeing you.  To Do: Marland Kitchen Continue cefazolin through PICC line.  Plan for end of treatment date will be determined shortly after your surgery  . For now, you can leave follow up with me as needed  If you have any questions or concerns, please do not hesitate to call the office at [phone].  Take Care,   Jule Ser, DO

## 2021-01-19 NOTE — Progress Notes (Signed)
[facility] for Infectious Disease  CHIEF COMPLAINT:    Follow up for MSSA bacteremia, IE, Osteo  SUBJECTIVE:    Shane [name] is a 80 y.o. male with PMHx as below who presents to the clinic for follow up of MSSA infection.   Last seen by me on 01/11/21 after MRI foot revealed osteomyelitis.  Seen by Dr Sharol Given on 2/10 who recommended ray amputation which is scheduled for 01/22/21.   Patient continues on cefazolin 2gm every 8 hrs via PICC line for MSSA AV endocarditis and osteomyelitis.  Superficial wound cultures had grown Serratia, Enterococcus, and anaerobes but not currently being targeted as unclear how significant these are.   Please see A&P for the details of today's visit and status of the patient's medical problems.   Patient's Medications  New Prescriptions   No medications on file  Previous Medications   ALBUTEROL (VENTOLIN HFA) 108 (90 BASE) MCG/ACT INHALER    Inhale 2 puffs into the lungs every 4 (four) hours as needed for wheezing or shortness of breath.   APIXABAN (ELIQUIS) 5 MG TABS TABLET    Take 1 tablet (5 mg total) by mouth 2 (two) times daily.   ASCORBIC ACID (VITAMIN C WITH ROSE HIPS) 500 MG TABLET    Take 500 mg by mouth daily.   ASPIRIN EC 81 MG TABLET    Take 81 mg by mouth daily.   ATORVASTATIN (LIPITOR) 40 MG TABLET    Take 20 mg by mouth in the morning and at bedtime.   CEFAZOLIN (ANCEF) IVPB    Inject 2 g into the vein every 8 (eight) hours. Indication:  Endocarditis First Dose: Yes Last Day of Therapy:  01/25/21 Labs - Once weekly:  CBC/D and BMP, Labs - Every other week:  ESR and CRP Method of administration: IV Push Method of administration may be changed at the discretion of home infusion pharmacist based upon assessment of the patient and/or caregiver's ability to self-administer the medication ordered.   COLLAGENASE (SANTYL) OINTMENT    Apply 1 application topically every other day.   CONTINUOUS BLOOD GLUC SENSOR (FREESTYLE LIBRE 14 DAY  SENSOR) MISC    1 patch every 14 (fourteen) days.   ENSURE MAX PROTEIN (ENSURE MAX PROTEIN) LIQD    Take 330 mLs (11 oz total) by mouth 2 (two) times daily.   EPINEPHRINE 0.3 MG/0.3 ML IJ SOAJ INJECTION    Inject 0.3 mg into the muscle once as needed for anaphylaxis (max 3 doses).   FARXIGA 10 MG TABS TABLET    Take 10 mg by mouth daily.   FUROSEMIDE (LASIX) 40 MG TABLET    Take 40 mg by mouth daily.   INSULIN LISPRO (HUMALOG) 100 UNIT/ML INJECTION    Inject 0.07 mLs (7 Units total) into the skin 3 (three) times daily with meals.   LORAZEPAM (ATIVAN) 1 MG TABLET    Take 1 mg by mouth at bedtime as needed for sleep.    LOSARTAN (COZAAR) 50 MG TABLET    Take 50 mg by mouth daily.   MAGNESIUM OXIDE (MAG-OX) 400 MG TABLET    TAKE 1 CAPSULE BY MOUTH ONCE DAILY   METOPROLOL TARTRATE (LOPRESSOR) 50 MG TABLET    Take 25 mg by mouth 2 (two) times daily.   MULTIPLE VITAMIN (MULTIVITAMIN WITH MINERALS) TABS TABLET    Take 1 tablet by mouth daily.   MUPIROCIN OINTMENT (BACTROBAN) 2 %    Apply 1 application topically every  other day.   PANTOPRAZOLE (PROTONIX) 40 MG TABLET    Take 40 mg by mouth daily.   POLYETHYLENE GLYCOL POWDER (GLYCOLAX/MIRALAX) 17 GM/SCOOP POWDER    Take 17 g by mouth daily as needed for moderate constipation.   POTASSIUM CHLORIDE SA (KLOR-CON) 20 MEQ TABLET    Take 20 mEq by mouth daily.   TRESIBA FLEXTOUCH 200 UNIT/ML FLEXTOUCH PEN    Inject 16 Units into the skin at bedtime.  Modified Medications   No medications on file  Discontinued Medications   No medications on file      Past Medical History:  Diagnosis Date  . 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 Cataract   . Chronic kidney disease    d/t DM  . CKD (chronic kidney disease), stage III ([place])   . Diabetes mellitus    Vgo disposal insulin bolus  simular to insulin pump  . Dyspnea   . GERD (gastroesophageal reflux disease)   . Hyperlipidemia   . Hypertension   . Idiopathic pulmonary fibrosis ([place]) 11/2016  . ILD (interstitial lung  disease) ([place])   . Moderate aortic stenosis    a. 10/2019 Echo: EF 55-60%, Gr2 DD. Nl RV.   Marland Kitchen Neuromuscular disorder ([place])   . Nonobstructive CAD (coronary artery disease)    a. 2012 Cath: mod, nonobs dzs; b. 10/2016 MV: EF 60%, no ischemia.  . OSA on CPAP 05/05/2017   Unattended Home Sleep Test 7/2/813-AHI 38.6/hour, desaturation to 64%, body weight 261 pounds  . PONV (postoperative nausea and vomiting)   . Sleep apnea     uses cpap asked to bring mask and tubing    Social History   Tobacco Use  . Smoking status: Never Smoker  . Smokeless tobacco: Never Used  Vaping Use  . Vaping Use: Never used  Substance Use Topics  . Alcohol use: No  . Drug use: No    Family History  Problem Relation Age of Onset  . Diabetes Mellitus II Mother   . Emphysema Father 50  . Heart attack Father   . Colon cancer Neg Hx   . Esophageal cancer Neg Hx   . Rectal cancer Neg Hx   . Stomach cancer Neg Hx     Allergies  Allergen Reactions  . Codeine Hives and Itching  . Ofev [Nintedanib] Diarrhea    SEVERE DIARRHEA  . Other Diarrhea and Other (See Comments)    Esbriet causes elevated LFTs. D/C on 06/14/17 and SEVERE DIARRHEA    Review of Systems  Constitutional: Negative for chills and fever.  Respiratory: Negative.   Cardiovascular: Negative.   Musculoskeletal: Negative for joint pain and neck pain.     OBJECTIVE:    Vitals:   01/19/21 1135  Weight: 241 lb (109.3 kg)  Height: 6' (1.829 m)   Body mass index is 32.69 kg/m.  Physical Exam Constitutional:      General: He is not in acute distress.    Appearance: Normal appearance.  Pulmonary:     Effort: Pulmonary effort is normal. No respiratory distress.  Musculoskeletal:     Comments: Right 5th metatarsal head with necrotic eschar.  No significant erythema.  No warmth.    Skin:    General: Skin is warm and dry.  Neurological:     General: No focal deficit present.     Mental Status: He is alert and oriented to person,  place, and time.  Psychiatric:        Mood and Affect: Mood normal.  Behavior: Behavior normal.      Labs and Microbiology: CBC Latest Ref Rng & Units 12/16/2020 12/15/2020 12/14/2020  WBC 4.0 - 10.5 K/uL 7.9 9.2 9.4  Hemoglobin 13.0 - 17.0 g/dL 8.8(L) 8.4(L) 9.1(L)  Hematocrit 39.0 - 52.0 % 26.9(L) 25.7(L) 28.0(L)  Platelets 150 - 400 K/uL 242 204 203   CMP Latest Ref Rng & Units 12/16/2020 12/15/2020 12/12/2020  Glucose 70 - 99 mg/dL 169(H) 147(H) 162(H)  BUN 8 - 23 mg/dL 31(H) 28(H) 31(H)  Creatinine 0.61 - 1.24 mg/dL 1.37(H) 1.41(H) 1.52(H)  Sodium 135 - 145 mmol/L 134(L) 132(L) 134(L)  Potassium 3.5 - 5.1 mmol/L 4.3 4.6 4.2  Chloride 98 - 111 mmol/L 99 100 102  CO2 22 - 32 mmol/L 26 23 23   Calcium 8.9 - 10.3 mg/dL 8.0(L) 8.0(L) 7.8(L)  Total Protein 6.5 - 8.1 g/dL 6.1(L) - -  Total Bilirubin 0.3 - 1.2 mg/dL 0.5 - -  Alkaline Phos 38 - 126 U/L 77 - -  AST 15 - 41 U/L 20 - -  ALT 0 - 44 U/L 10 - -    IMPRESSION: Findings consistent with osteomyelitis in the head and neck of the fifth metatarsal. Much milder edema and enhancement in the diaphysis of the fifth metatarsal and proximal phalanx of the fifth toe has an appearance most suggestive of reactive change.  Skin wound adjacent to the head of the fifth metatarsal without underlying abscess.   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  PLAN: . Continue cefazolin as planned.  Tentative end date remains 01/25/21 but ultimately will be decided after his surgical procedure on 01/22/21 with Dr Sharol Given.  If good source control obtained then likely can stop abx as planned.    Diabetic foot ulcer ([place]) This is an ongoing issue.  MRI obtained showed findings consistent with OM.  He was seen by Dr Sharol Given last week who recommended ray amputation scheduled for 01/22/21.  PLAN: . Agree with surgery for definitive source control . Continue  cefazolin for MSSA infection . Will continue to hold empiric therapy to cover superficial wound coverage given its unclear significance   Peripheral vascular disease ([place]) Status post intervention during hospitalization with improved blood flow.  PLAN: . Continued vascular follow up      [facility] for Infectious [facility] 01/19/2021, 12:05 PM

## 2021-01-20 ENCOUNTER — Ambulatory Visit (HOSPITAL_COMMUNITY)
Admission: RE | Admit: 2021-01-20 | Discharge: 2021-01-20 | Disposition: A | Discharge status: Home / Self Care | Payer: Medicare Other | Source: Ambulatory Visit | Attending: Internal Medicine | Admitting: Internal Medicine

## 2021-01-20 ENCOUNTER — Other Ambulatory Visit (HOSPITAL_COMMUNITY)
Admission: RE | Admit: 2021-01-20 | Discharge: 2021-01-20 | Disposition: A | Discharge status: Home / Self Care | Payer: Medicare Other | Source: Ambulatory Visit | Attending: Orthopedic Surgery | Admitting: Orthopedic Surgery

## 2021-01-20 ENCOUNTER — Telehealth: Payer: Self-pay | Admitting: Orthopedic Surgery

## 2021-01-20 DIAGNOSIS — Z006 Encounter for examination for normal comparison and control in clinical research program: Secondary | ICD-10-CM

## 2021-01-20 DIAGNOSIS — J84112 Idiopathic pulmonary fibrosis: Secondary | ICD-10-CM

## 2021-01-20 DIAGNOSIS — Z01812 Encounter for preprocedural laboratory examination: Secondary | ICD-10-CM | POA: Insufficient documentation

## 2021-01-20 DIAGNOSIS — Z20822 Contact with and (suspected) exposure to covid-19: Secondary | ICD-10-CM | POA: Insufficient documentation

## 2021-01-20 LAB — SARS CORONAVIRUS 2 (TAT 6-24 HRS): SARS Coronavirus 2: NEGATIVE

## 2021-01-20 MED ORDER — STUDY - FIBROGEN - PAMREVLUMAB 10 MG/ML IV INFUSION (OPEN LABEL) (PI-RAMASWAMY)
30.0000 mg/kg | Freq: Once | INTRAVENOUS | Status: AC
  Administered 2021-01-20: 3650 mg via INTRAVENOUS
  Filled 2021-01-20: qty 365

## 2021-01-20 NOTE — Research (Signed)
Title: [id number] Open-Label Extension (OLE) is a multi-center, single-arm, open-label extension (OLE) phase where subjects who complete the Week 48 visit of the main study are eligible to participate. The extension to the main study allows for the continued evaluation of the efficacy and safety of 30 mg/kg IV infusions of pamrevlumab administered every 3 weeks for 52 weeks in subjects with Idiopathic Pulmonary Fibrosis.  Primary endpoint is: To provide continued access to pamrevlumab in subjects with IPF who completed the Week 48 visit of the main study  [place] / Research RN note : This visit for Subject Shane [name] with DOB: [birth year] on 01/20/2021 for the above protocol is Visit/Encounter #EX Week 63 and is for purpose of research. The consent for this encounter is under Protocol Version Amendment 5, Investigator Brochure Edition 19 Consent Version 4.0, revised 21JUL2021 and is currently IRB approved.   Subject expressed continued interest and consent in continuing as a study subject. Subject confirmed that there was no change in contact information (e.g. address, telephone, email). Subject thanked for participation in research and contribution to science.  In this visit 01/20/2021 the subject is not required to be evaluated by an investigator.   All Ex Week 63 visit procedures and assessments were completed per the above mentioned protocol. Study drug was infused without problems. Please refer to subject's paper source binder for further details of the visit. The subject will return in approximately 3 weeks for his next infusion.  Signed by [place] Assistant [facility]  [place], [place] 5:11 PM 01/20/2021

## 2021-01-20 NOTE — Telephone Encounter (Signed)
Pt wife called and she has lots of questions about for after the surgery what is going to happen. She would really love a call back. CB [phone]

## 2021-01-21 ENCOUNTER — Encounter (HOSPITAL_COMMUNITY): Payer: Self-pay | Admitting: Orthopedic Surgery

## 2021-01-21 ENCOUNTER — Other Ambulatory Visit: Payer: Self-pay | Admitting: Physician Assistant

## 2021-01-21 ENCOUNTER — Other Ambulatory Visit: Payer: Self-pay

## 2021-01-21 NOTE — Telephone Encounter (Signed)
I called and sw pt's wife and she had several questions of which were reviewed with Dr. Sharol Given had questions about what to do in the event of a low BS in the early morning hours prior to surgery if he is NPO after midnight. Advised ok to drink Gatorade, also asked if she needed to give the 7 am dose of the PICC line ABX advised that no, he will receive abx pr operatively. Advised if there are any other questions to call the office any time. Confirmed pt has a post op appt already scheduled.

## 2021-01-21 NOTE — Anesthesia Preprocedure Evaluation (Addendum)
Anesthesia Evaluation  Patient identified by MRN, date of birth, ID band Patient awake    Reviewed: Allergy & Precautions, NPO status , Patient's Chart, lab work & pertinent test results  History of Anesthesia Complications (+) PONV and history of anesthetic complications  Airway Mallampati: II  TM Distance: >3 FB Neck ROM: Full    Dental  (+) Teeth Intact   Pulmonary shortness of breath, sleep apnea and Continuous Positive Airway Pressure Ventilation ,  Idiopathic pulmonary fibrosis   Pulmonary exam normal        Cardiovascular hypertension, + CAD, + CABG (11/2020) and + Peripheral Vascular Disease  + dysrhythmias + Valvular Problems/Murmurs AS  Rhythm:Regular Rate:Normal     Neuro/Psych  Neuromuscular disease negative psych ROS   GI/Hepatic GERD  ,  Endo/Other  diabeteshyperlipidemia  Renal/GU Renal Insufficiency and CRFRenal disease  negative genitourinary   Musculoskeletal negative musculoskeletal ROS (+)   Abdominal (+)  Abdomen: soft. Bowel sounds: normal.  Peds  Hematology negative hematology ROS (+)   Anesthesia Other Findings   Reproductive/Obstetrics negative OB ROS                           Anesthesia Physical Anesthesia Plan  ASA: IV  Anesthesia Plan: MAC and Regional   Post-op Pain Management:    Induction: Intravenous  PONV Risk Score and Plan: 2 and Propofol infusion, Treatment may vary due to age or medical condition and Midazolam  Airway Management Planned: Natural Airway and Simple Face Mask  Additional Equipment: None  Intra-op Plan:   Post-operative Plan:   Informed Consent: I have reviewed the patients History and Physical, chart, labs and discussed the procedure including the risks, benefits and alternatives for the proposed anesthesia with the patient or authorized representative who has indicated his/her understanding and acceptance.     Dental  advisory given  Plan Discussed with: CRNA and Anesthesiologist  Anesthesia Plan Comments: (ECHO 01/22 1. There is small fibrous stranding attached to the leaflet tips of the  aortic valve that could be related to valve degeneration. This could also  represent a lambl's excrescence. In the setting of bacteremia, cannot  exclude endocarditis. The stranding  is small (0.5 cm x 0.2 cm) and there is no significant regurgitaiton of  the aortic valve.  2. There is no vegetation seen on the pacemaker leads.  3. The aortic valve is trileaflet with moderate calcification and  moderately reduced motion. 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 There  is mild aortic stenosis. Vmax 2.2 m/s, MG 12.5 mmHG, AVA 1.65 cm2. The  aortic valve is tricuspid. There is  moderate calcification of the aortic valve. There is moderate thickening  of the aortic valve. Aortic valve regurgitation is not visualized. Mild  aortic valve stenosis.  4. Left ventricular ejection fraction, by estimation, is 50 to 55%. The  left ventricle has low normal function.  5. Right ventricular systolic function is moderately reduced. The right  ventricular size is moderately enlarged.  6. Left atrial size was mildly dilated. No left atrial/left atrial  appendage thrombus was detected. The LAA emptying velocity was 49 cm/s.  7. The mitral valve is grossly normal. Mild mitral valve regurgitation.  No evidence of mitral stenosis.  8. Moderate to severe tricuspid regurgitation is present. 2D PISA 0.9 cm.  2D VC 0.5 cm, 2D ERO 0.44 cm2, R vol 36 cc. The tricuspid valve is  abnormal. Tricuspid valve regurgitation is moderate to severe.  Lab Results      Component                Value               Date                      WBC                      9.7                 01/22/2021                HGB                      11.5 (L)            01/22/2021                HCT                      36.7 (L)            01/22/2021                 MCV                      92.7                01/22/2021                PLT                      283                 01/22/2021           Lab Results      Component                Value               Date                      NA                       130 (L)             01/22/2021                K                        5.0                 01/22/2021                CO2                      24                  01/22/2021                GLUCOSE                  218 (H)             01/22/2021                BUN  42 (H)              01/22/2021                CREATININE               1.39 (H)            01/22/2021                CALCIUM                  9.0                 01/22/2021                GFRNONAA                 52 (L)              01/22/2021                GFRAA                    53 (L)              07/17/2020          )     Anesthesia Quick Evaluation                                  Anesthesia Evaluation  Patient identified by MRN, date of birth, ID band Patient awake    Reviewed: Allergy & Precautions, NPO status , Patient's Chart, lab work & pertinent test results, reviewed documented beta blocker date and time   History of Anesthesia Complications (+) PONV and history of anesthetic complications  Airway Mallampati: III  TM Distance: >3 FB Neck ROM: Full    Dental  (+) Poor Dentition, Dental Advisory Given, Chipped,    Pulmonary shortness of breath (uses oxygen PRN after CABG last month), with exertion, lying and Long-Term Oxygen Therapy, sleep apnea and Continuous Positive Airway Pressure Ventilation ,  Idiopathic pulmonary fibrosis diagnosed 2017    Pulmonary exam normal breath sounds clear to auscultation       Cardiovascular hypertension, Pt. on medications and Pt. on home beta blockers + CAD, + CABG (11/2020) and + Peripheral Vascular Disease  + dysrhythmias (2nd degree AVB, new afib/flutter started on eliquis) Atrial  Fibrillation + pacemaker + Valvular Problems/Murmurs (mild AS) AS  Rhythm:Irregular Rate:Normal  Echo 03/2020: 1. Left ventricular ejection fraction, by estimation, is 60 to 65%. The  left ventricle has normal function. The left ventricle has no regional  wall motion abnormalities. There is mild concentric left ventricular  hypertrophy. Diastolic function  assessment is limited by second degree AV block, Mobitz type 1, but  annular velocities are in normal range.  2. Right ventricular systolic function is normal. The right ventricular  size is normal. Tricuspid regurgitation signal is inadequate for assessing  PA pressure.  3. Left atrial size was moderately dilated.  4. The mitral valve is normal in structure. Mild mitral valve  regurgitation.  5. The aortic valve is tricuspid. Aortic valve regurgitation is not  visualized. Mild aortic valve stenosis.  6. Aortic dilatation noted. 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   Currently in A flutter, Rate in the 60s   Neuro/Psych negative psych ROS   GI/Hepatic Neg liver ROS,  GERD  Medicated and Controlled,  Endo/Other  negative endocrine ROSdiabetes, Type 2, Insulin DependentObesity BMI 36  Renal/GU Renal InsufficiencyRenal diseaseCr 1.55, CKD 3  negative genitourinary   Musculoskeletal negative musculoskeletal ROS (+)   Abdominal (+) + obese,   Peds  Hematology  (+) Blood dyscrasia, anemia , hct 28.1   Anesthesia Other Findings Bacteremia s/p CABG at [facility] 11/04/2020, moderate AS, discharged from [facility] 11/18/2020, slow recovery since, presented to ED due to 1 week or so history of increasing fatigue, chronic dyspnea, recent chills, low-grade fevers, frequent cough.  Chart review of OP notes indicate bouts of atrial tachycardia and A. fib since approximately 12/27 and device clinic from earlier on 1/4. Started on eliquis, found to have RLE ischemia now s/p angio  Reproductive/Obstetrics negative OB  ROS                            Anesthesia Physical Anesthesia Plan  ASA: IV  Anesthesia Plan: MAC   Post-op Pain Management:    Induction:   PONV Risk Score and Plan: 2 and Propofol infusion and TIVA  Airway Management Planned: Natural Airway and Simple Face Mask  Additional Equipment: None  Intra-op Plan:   Post-operative Plan:   Informed Consent: I have reviewed the patients History and Physical, chart, labs and discussed the procedure including the risks, benefits and alternatives for the proposed anesthesia with the patient or authorized representative who has indicated his/her understanding and acceptance.       Plan Discussed with: CRNA  Anesthesia Plan Comments:        Anesthesia Quick Evaluation

## 2021-01-21 NOTE — Progress Notes (Signed)
Anesthesia Chart Review: Same day workup  Recent complicated cardiac and vascular history.  He is status post CABG 11/04/2020 at [facility] subsequently developed sepsis required pacemaker explant.  He was hospitalized at [facility] 12/08/2020 through 12/16/2020.  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 Patient came to ER with severe weakness. Admitted with new onset A-flutter, generalized weakness and malaise. Right foot wound. Ultimately found to have MSSA bacteremia with suspected endocarditis thought to be from right foot wound initially but then could have been from his Pacemaker. Seen and followed by cardiology, orthopedics, vascular surgery and infectious disease. Right superficial femoral/above-knee popliteal stent on 1/6. 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 Pacemaker removal on 1/10.Marland Kitchen PICC Line placed on 12/16/20 and Stable for D/C after 6 weeks of Abx. He is to start Eliquis 5 mg BID on 12/19/20."  Since discharge patient has had close follow-up with infectious disease Dr. Jule Ser.  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  He was seen by Dr. Sharol Given on 2/10 and recommended ray amputation.  Patient continues on cefazolin 2 g every 8 hours via PICC line for MSSA AV endocarditis and osteomyelitis.  Since discharge there have also been multiple telephone encounters with cardiology regarding patient weight gain.  This has been managed with titration of diuretics.  IDDM 2 last A1c 6.6 on 12/09/2020.  History of CKD  3.  OSA on CPAP.  He will need day of surgery labs and evaluation.  EKG 12/15/2020: Atrial flutter with variable A-V block.  Ventricular rate 65. Left axis deviation. Right bundle branch block. Inferior infarct , age undetermined. No significant change since last tracing  PORTABLE CHEST 1 VIEW 12/16/20: COMPARISON:  December 15, 2020.  FINDINGS: Right PICC tip projects at the superior cavoatrial junction. No visible pneumothorax on this semi erect radiograph. Similar enlargement the cardiac silhouette. Prior CABG with median sternotomy. Similar left pleural thickening and chronic interstitial changes. Prior ACDF.  IMPRESSION: 1. Right PICC tip projects at the superior cavoatrial junction. No visible pneumothorax. 2. Similar cardiomegaly, chronic interstitial change, and left pleural thickening.  TEE 12/11/2020:  1. There is small fibrous stranding attached to the leaflet tips of the  aortic valve that could be related to valve degeneration. This could also  represent a lambl's excrescence. In the setting of bacteremia, cannot  exclude endocarditis. The stranding  is small (0.5 cm x 0.2 cm) and there is no significant regurgitaiton of  the aortic valve.  2. There is no vegetation seen on the pacemaker leads.  3. The aortic valve is trileaflet with moderate calcification and  moderately reduced motion. 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 There  is mild aortic stenosis. Vmax 2.2 m/s, MG 12.5 mmHG, AVA 1.65 cm2. The  aortic valve is tricuspid. There is  moderate calcification of the aortic valve. There is moderate thickening  of the aortic valve. Aortic valve regurgitation is not visualized. Mild  aortic valve stenosis.  4. Left ventricular ejection fraction, by estimation, is 50 to 55%. The  left ventricle has low normal function.  5. Right ventricular systolic  function is moderately reduced. The right  ventricular size is moderately enlarged.  6. Left atrial size was  mildly dilated. No left atrial/left atrial  appendage thrombus was detected. The LAA emptying velocity was 49 cm/s.  7. The mitral valve is grossly normal. Mild mitral valve regurgitation.  No evidence of mitral stenosis.  8. Moderate to severe tricuspid regurgitation is present. 2D PISA 0.9 cm.  2D VC 0.5 cm, 2D ERO 0.44 cm2, R vol 36 cc. The tricuspid valve is  abnormal. Tricuspid valve regurgitation is moderate to severe.    [place], [place] [facility]/Anesthesiology Phone [phone] 01/21/2021 11:22 AM

## 2021-01-21 NOTE — Progress Notes (Signed)
Mr. Shane [name] denies chest pain or shortness of breath. Patient tested negative for Covid_2/16/22_ and has been in quarantine since that time.  Mr. Shane [name] has type II diabetes, patient has a Libre II continuous glucose monitor- patient report that CBGs average 150, there has been a few 50's.  I encouraged patient to have a snack before bedtime, patient has Ensure with 36 units of protein, I said that would be good. [name] has taken [place] today, I instructed patient to not take any medication for diabetes in am. I instructed patient if CBgG if < 70 to treat it with 4 glucose tablets, if it its before 0940, drink apple juice; check CBG in 15 minutes , if CBG is not over 70, call Pre - surgery desk.  Mr Shane [name] has sleep apnea, I instructed him to bring his mask.

## 2021-01-22 ENCOUNTER — Ambulatory Visit (HOSPITAL_COMMUNITY)
Admission: RE | Admit: 2021-01-22 | Discharge: 2021-01-22 | Disposition: A | Discharge status: Home / Self Care | Payer: Medicare Other | Attending: Orthopedic Surgery | Admitting: Orthopedic Surgery

## 2021-01-22 ENCOUNTER — Telehealth: Payer: Self-pay | Admitting: Orthopedic Surgery

## 2021-01-22 ENCOUNTER — Other Ambulatory Visit: Payer: Self-pay

## 2021-01-22 ENCOUNTER — Ambulatory Visit (HOSPITAL_COMMUNITY): Payer: Medicare Other | Admitting: Physician Assistant

## 2021-01-22 ENCOUNTER — Encounter (HOSPITAL_COMMUNITY): Payer: Self-pay | Admitting: Orthopedic Surgery

## 2021-01-22 ENCOUNTER — Ambulatory Visit (HOSPITAL_COMMUNITY): Payer: Medicare Other

## 2021-01-22 ENCOUNTER — Encounter (HOSPITAL_COMMUNITY)
Admission: RE | Disposition: A | Discharge status: Home / Self Care | Payer: Self-pay | Source: Home / Self Care | Attending: Orthopedic Surgery

## 2021-01-22 DIAGNOSIS — E1122 Type 2 diabetes mellitus with diabetic chronic kidney disease: Secondary | ICD-10-CM | POA: Insufficient documentation

## 2021-01-22 DIAGNOSIS — E1169 Type 2 diabetes mellitus with other specified complication: Secondary | ICD-10-CM | POA: Insufficient documentation

## 2021-01-22 DIAGNOSIS — E13621 Other specified diabetes mellitus with foot ulcer: Secondary | ICD-10-CM | POA: Diagnosis not present

## 2021-01-22 DIAGNOSIS — I129 Hypertensive chronic kidney disease with stage 1 through stage 4 chronic kidney disease, or unspecified chronic kidney disease: Secondary | ICD-10-CM | POA: Insufficient documentation

## 2021-01-22 DIAGNOSIS — I2510 Atherosclerotic heart disease of native coronary artery without angina pectoris: Secondary | ICD-10-CM | POA: Diagnosis not present

## 2021-01-22 DIAGNOSIS — G4733 Obstructive sleep apnea (adult) (pediatric): Secondary | ICD-10-CM | POA: Insufficient documentation

## 2021-01-22 DIAGNOSIS — L97514 Non-pressure chronic ulcer of other part of right foot with necrosis of bone: Secondary | ICD-10-CM

## 2021-01-22 DIAGNOSIS — N183 Chronic kidney disease, stage 3 unspecified: Secondary | ICD-10-CM | POA: Insufficient documentation

## 2021-01-22 DIAGNOSIS — Z885 Allergy status to narcotic agent status: Secondary | ICD-10-CM | POA: Diagnosis not present

## 2021-01-22 DIAGNOSIS — M869 Osteomyelitis, unspecified: Secondary | ICD-10-CM | POA: Insufficient documentation

## 2021-01-22 DIAGNOSIS — Z888 Allergy status to other drugs, medicaments and biological substances status: Secondary | ICD-10-CM | POA: Diagnosis not present

## 2021-01-22 DIAGNOSIS — Z951 Presence of aortocoronary bypass graft: Secondary | ICD-10-CM | POA: Diagnosis not present

## 2021-01-22 DIAGNOSIS — M21371 Foot drop, right foot: Secondary | ICD-10-CM | POA: Insufficient documentation

## 2021-01-22 HISTORY — PX: AMPUTATION: SHX166

## 2021-01-22 HISTORY — DX: Polyneuropathy in diseases classified elsewhere: E34.9

## 2021-01-22 HISTORY — DX: Personal history of urinary calculi: Z87.442

## 2021-01-22 HISTORY — DX: Endocrine disorder, unspecified: E34.9

## 2021-01-22 HISTORY — DX: Polyneuropathy in diseases classified elsewhere: G63

## 2021-01-22 HISTORY — DX: Endocrine disorder, unspecified: G63

## 2021-01-22 HISTORY — PX: ANKLE FUSION: SHX5718

## 2021-01-22 LAB — CBC
HCT: 36.7 % — ABNORMAL LOW (ref 39.0–52.0)
Hemoglobin: 11.5 g/dL — ABNORMAL LOW (ref 13.0–17.0)
MCH: 29 pg (ref 26.0–34.0)
MCHC: 31.3 g/dL (ref 30.0–36.0)
MCV: 92.7 fL (ref 80.0–100.0)
Platelets: 283 10*3/uL (ref 150–400)
RBC: 3.96 MIL/uL — ABNORMAL LOW (ref 4.22–5.81)
RDW: 13.4 % (ref 11.5–15.5)
WBC: 9.7 10*3/uL (ref 4.0–10.5)
nRBC: 0 % (ref 0.0–0.2)

## 2021-01-22 LAB — BASIC METABOLIC PANEL
Anion gap: 13 (ref 5–15)
BUN: 42 mg/dL — ABNORMAL HIGH (ref 8–23)
CO2: 24 mmol/L (ref 22–32)
Calcium: 9 mg/dL (ref 8.9–10.3)
Chloride: 93 mmol/L — ABNORMAL LOW (ref 98–111)
Creatinine, Ser: 1.39 mg/dL — ABNORMAL HIGH (ref 0.61–1.24)
GFR, Estimated: 52 mL/min — ABNORMAL LOW (ref >60)
Glucose, Bld: 218 mg/dL — ABNORMAL HIGH (ref 70–99)
Potassium: 5 mmol/L (ref 3.5–5.1)
Sodium: 130 mmol/L — ABNORMAL LOW (ref 135–145)

## 2021-01-22 LAB — SURGICAL PCR SCREEN
MRSA, PCR: NEGATIVE
Staphylococcus aureus: NEGATIVE

## 2021-01-22 LAB — TYPE AND SCREEN
ABO/RH(D): O POS
Antibody Screen: NEGATIVE

## 2021-01-22 LAB — GLUCOSE, CAPILLARY
Glucose-Capillary: 207 mg/dL — ABNORMAL HIGH (ref 70–99)
Glucose-Capillary: 174 mg/dL — ABNORMAL HIGH (ref 70–99)

## 2021-01-22 SURGERY — ANKLE FUSION
Anesthesia: Monitor Anesthesia Care | Site: Ankle | Laterality: Right

## 2021-01-22 MED ORDER — DEXMEDETOMIDINE (PRECEDEX) IN NS 20 MCG/5ML (4 MCG/ML) IV SYRINGE
PREFILLED_SYRINGE | INTRAVENOUS | Status: DC | PRN
  Administered 2021-01-22 (×2): 4 ug via INTRAVENOUS

## 2021-01-22 MED ORDER — ONDANSETRON HCL 4 MG/2ML IJ SOLN: 4.0000 mg | Freq: Once | INTRAMUSCULAR | Status: DC | PRN

## 2021-01-22 MED ORDER — ROPIVACAINE HCL 5 MG/ML IJ SOLN
INTRAMUSCULAR | Status: DC | PRN
  Administered 2021-01-22: 30 mL via PERINEURAL

## 2021-01-22 MED ORDER — EPHEDRINE SULFATE 50 MG/ML IJ SOLN
INTRAMUSCULAR | Status: DC | PRN
  Administered 2021-01-22 (×2): 5 mg via INTRAVENOUS

## 2021-01-22 MED ORDER — ONDANSETRON HCL 4 MG/2ML IJ SOLN
INTRAMUSCULAR | Status: DC | PRN
  Administered 2021-01-22: 4 mg via INTRAVENOUS

## 2021-01-22 MED ORDER — LIDOCAINE HCL (PF) 2 % IJ SOLN
INTRAMUSCULAR | Status: DC | PRN
  Administered 2021-01-22: 60 mg via INTRADERMAL

## 2021-01-22 MED ORDER — AMISULPRIDE (ANTIEMETIC) 5 MG/2ML IV SOLN: 10.0000 mg | Freq: Once | INTRAVENOUS | Status: DC | PRN

## 2021-01-22 MED ORDER — 0.9 % SODIUM CHLORIDE (POUR BTL) OPTIME
TOPICAL | Status: DC | PRN
  Administered 2021-01-22: 1000 mL

## 2021-01-22 MED ORDER — FENTANYL CITRATE (PF) 100 MCG/2ML IJ SOLN: 25.0000 ug | INTRAMUSCULAR | Status: DC | PRN

## 2021-01-22 MED ORDER — LACTATED RINGERS IV SOLN: INTRAVENOUS | Status: DC

## 2021-01-22 MED ORDER — FENTANYL CITRATE (PF) 100 MCG/2ML IJ SOLN
INTRAMUSCULAR | Status: AC
  Administered 2021-01-22: 50 ug via INTRAVENOUS
  Filled 2021-01-22: qty 2

## 2021-01-22 MED ORDER — ONDANSETRON HCL 4 MG PO TABS: 4.0000 mg | ORAL_TABLET | Freq: Three times a day (TID) | ORAL | 0 refills | Status: DC | PRN

## 2021-01-22 MED ORDER — FENTANYL CITRATE (PF) 100 MCG/2ML IJ SOLN: 50.0000 ug | Freq: Once | INTRAMUSCULAR | Status: AC

## 2021-01-22 MED ORDER — CEFAZOLIN SODIUM-DEXTROSE 2-4 GM/100ML-% IV SOLN
2.0000 g | INTRAVENOUS | Status: AC
  Administered 2021-01-22: 2 g via INTRAVENOUS
  Filled 2021-01-22: qty 100

## 2021-01-22 MED ORDER — CHLORHEXIDINE GLUCONATE 0.12 % MT SOLN
15.0000 mL | Freq: Once | OROMUCOSAL | Status: AC
  Administered 2021-01-22: 15 mL via OROMUCOSAL
  Filled 2021-01-22: qty 15

## 2021-01-22 MED ORDER — HYDROCODONE-ACETAMINOPHEN 5-325 MG PO TABS: 1.0000 | ORAL_TABLET | ORAL | 0 refills | Status: DC | PRN

## 2021-01-22 MED ORDER — ORAL CARE MOUTH RINSE: 15.0000 mL | Freq: Once | OROMUCOSAL | Status: AC

## 2021-01-22 MED ORDER — MIDAZOLAM HCL 2 MG/2ML IJ SOLN
INTRAMUSCULAR | Status: AC
  Filled 2021-01-22: qty 2

## 2021-01-22 MED ORDER — PROPOFOL 500 MG/50ML IV EMUL
INTRAVENOUS | Status: DC | PRN
  Administered 2021-01-22: 100 ug/kg/min via INTRAVENOUS

## 2021-01-22 SURGICAL SUPPLY — 61 items
BANDAGE ESMARK 6X9 LF (GAUZE/BANDAGES/DRESSINGS) ×2 IMPLANT
BIT DRILL CANNULATED 4.6 (BIT) ×1 IMPLANT
BIT DRILL LONG 3.1X160 (DRILL) IMPLANT
BIT DRILL SOLID LONG 2.8X160 (DRILL) IMPLANT
BLADE AVERAGE 25X9 (BLADE) ×3 IMPLANT
BLADE SAW SGTL MED 73X18.5 STR (BLADE) ×3 IMPLANT
BLADE SURG 10 STRL SS (BLADE) IMPLANT
BLADE SURG 21 STRL SS (BLADE) ×3 IMPLANT
BNDG CMPR 9X6 STRL LF SNTH (GAUZE/BANDAGES/DRESSINGS) ×2
BNDG COHESIVE 4X5 TAN STRL (GAUZE/BANDAGES/DRESSINGS) ×3 IMPLANT
BNDG ESMARK 6X9 LF (GAUZE/BANDAGES/DRESSINGS) ×3
BNDG GAUZE ELAST 4 BULKY (GAUZE/BANDAGES/DRESSINGS) ×6 IMPLANT
COVER MAYO STAND STRL (DRAPES) IMPLANT
COVER SURGICAL LIGHT HANDLE (MISCELLANEOUS) ×6 IMPLANT
COVER WAND RF STERILE (DRAPES) ×3 IMPLANT
DRAPE DERMATAC (DRAPES) ×1 IMPLANT
DRAPE OEC MINIVIEW 54X84 (DRAPES) IMPLANT
DRAPE U-SHAPE 47X51 STRL (DRAPES) ×6 IMPLANT
DRESSING PREVENA PLUS CUSTOM (GAUZE/BANDAGES/DRESSINGS) IMPLANT
DRILL LONG 3.1X160 (DRILL) ×3
DRILL SOLID LONG 2.8X160 (DRILL) ×3
DRSG ADAPTIC 3X8 NADH LF (GAUZE/BANDAGES/DRESSINGS) ×3 IMPLANT
DRSG PAD ABDOMINAL 8X10 ST (GAUZE/BANDAGES/DRESSINGS) ×6 IMPLANT
DRSG PREVENA PLUS CUSTOM (GAUZE/BANDAGES/DRESSINGS) ×3
DURAPREP 26ML APPLICATOR (WOUND CARE) ×3 IMPLANT
ELECT REM PT RETURN 9FT ADLT (ELECTROSURGICAL) ×3
ELECTRODE REM PT RTRN 9FT ADLT (ELECTROSURGICAL) ×2 IMPLANT
GAUZE SPONGE 4X4 12PLY STRL (GAUZE/BANDAGES/DRESSINGS) ×3 IMPLANT
GLOVE BIOGEL PI IND STRL 9 (GLOVE) ×2 IMPLANT
GLOVE BIOGEL PI INDICATOR 9 (GLOVE) ×1
GLOVE SURG ORTHO 9.0 STRL STRW (GLOVE) ×3 IMPLANT
GOWN STRL REUS W/ TWL LRG LVL3 (GOWN DISPOSABLE) ×2 IMPLANT
GOWN STRL REUS W/ TWL XL LVL3 (GOWN DISPOSABLE) ×4 IMPLANT
GOWN STRL REUS W/TWL LRG LVL3 (GOWN DISPOSABLE) ×3
GOWN STRL REUS W/TWL XL LVL3 (GOWN DISPOSABLE) ×6
K-WIRE SINGLE TROCAR 2.3X230 (WIRE) ×6
KIT BASIN OR (CUSTOM PROCEDURE TRAY) ×3 IMPLANT
KIT TURNOVER KIT B (KITS) ×3 IMPLANT
KWIRE SINGLE TROCAR 2.3X230 (WIRE) ×2 IMPLANT
NS IRRIG 1000ML POUR BTL (IV SOLUTION) ×3 IMPLANT
PACK ORTHO EXTREMITY (CUSTOM PROCEDURE TRAY) ×3 IMPLANT
PAD ARMBOARD 7.5X6 YLW CONV (MISCELLANEOUS) ×6 IMPLANT
PLATE ANT TTC FLAT RT (Plate) ×2 IMPLANT
SCREW CANN THRD 7X76X20 (Screw) ×2 IMPLANT
SCREW CANN THRD SHORT 7X55X16 (Screw) ×1 IMPLANT
SCREW LOCK PLATE R3 4.2X26 (Screw) ×3 IMPLANT
SCREW LOCK PLT 26X4.2XNS GRLL (Screw) ×1 IMPLANT
SCREW NLOCK PLATE SB 4.5X30 (Screw) ×1 IMPLANT
SCREW NLOCK PLATE SB 4.5X32 (Screw) ×1 IMPLANT
SCREW NLOCK PLATE SB 4.5X34 (Screw) ×2 IMPLANT
SCREW NONLOCK PLATE R3 4.2X24 (Screw) ×1 IMPLANT
SPONGE LAP 18X18 RF (DISPOSABLE) ×3 IMPLANT
STOCKINETTE IMPERVIOUS LG (DRAPES) IMPLANT
SUCTION FRAZIER HANDLE 10FR (MISCELLANEOUS) ×3
SUCTION TUBE FRAZIER 10FR DISP (MISCELLANEOUS) ×2 IMPLANT
SUT ETHILON 2 0 PSLX (SUTURE) ×10 IMPLANT
TOWEL GREEN STERILE (TOWEL DISPOSABLE) ×3 IMPLANT
TOWEL GREEN STERILE FF (TOWEL DISPOSABLE) ×3 IMPLANT
TUBE CONNECTING 12X1/4 (SUCTIONS) ×3 IMPLANT
WATER STERILE IRR 1000ML POUR (IV SOLUTION) ×3 IMPLANT
YANKAUER SUCT BULB TIP NO VENT (SUCTIONS) ×3 IMPLANT

## 2021-01-22 NOTE — Interval H&P Note (Signed)
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  The various methods of treatment have been discussed with the patient and family. After consideration of risks, benefits and other options for treatment, the patient has consented to  Procedure(s): RIGHT FOOT TIBIOCALCANEAL FUSION (Right) 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 (Right) as a surgical intervention.  The patient's history has been reviewed, patient examined, no change in status, stable for surgery.  I have reviewed the patient's chart and labs.  Questions were answered to the patient's satisfaction.     [name]

## 2021-01-22 NOTE — Op Note (Signed)
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 Application of Prevena wound VAC  SURGEON:  [name], MD  PHYSICIAN ASSISTANT:None ANESTHESIA:   General  PREOPERATIVE INDICATIONS:  Shane [name] is a  80 y.o. 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    The risks benefits and alternatives were discussed with the patient preoperatively including but not limited to the risks of infection, bleeding, nerve injury, cardiopulmonary complications, the need for revision surgery, among others, and the patient was willing to proceed.  OPERATIVE IMPLANTS: Anterior Paragon plate  @ENCIMAGES @  OPERATIVE FINDINGS: C-arm fluoroscopy verified reduction of tibial talar calcaneal fusion.  OPERATIVE PROCEDURE: Patient was brought the operating room after undergoing a regional anesthetic.  After adequate levels anesthesia were obtained patient's right lower extremity was prepped using DuraPrep draped into a sterile field the foot was completely covered with Ioban to drape the necrotic ulcer over the fifth metatarsal head out of the sterile field.  A dorsal incision was made in the interval was carried down between the EHL tendon and the anterior tibial tendon.  Blunt dissection was carried down to the capsule and subperiosteal dissection was performed to identify the tibial talar joint.  An oscillating saw was used to debride the tibial talar joint of articular cartilage the cuts were made perpendicular to the long axis of the tibia.  The wound was irrigated with normal saline and the joint reduced the foot was at 90 degrees.  The  anterior plate was applied to compression screws were placed in the talus followed by a lag screw in the tibia.  3 compression screws were placed in the tibia a compression screw was then placed through the plate down to the calcaneus and a separate oblique screw was placed across the tibia and talus.  C-arm fluoroscopy verified reduction.  The wound was irrigated normal saline and the incision was closed using 2-0 nylon.  A Praveena customizable wound VAC sponge was applied to the incision to protect it from the amputation of the fifth ray.  The dressing was then removed and a elliptical incision was made around the ulcerative tissue and the fifth ray this left a wound that was 3 x 9 cm.  The ray was resected proximally beveled plantarly with an oscillating saw electrocautery was used for hemostasis the wound was irrigated with normal saline there was no deep abscess the tissue margins were healthy.  Local tissue rearrangement was used to close the wound 9 x 3 cm.  This was closed using 2-0 nylon.  A Praveena customizable VAC dressing was applied to the fifth ray amputation these 2 foam sponges were bridged a dressing was applied this was overwrapped with Covan this had a good suction fit patient was taken the PACU in stable condition.   DISCHARGE PLANNING:  Antibiotic duration: Preoperative antibiotics  Weightbearing: Touchdown weightbearing on the right  Pain medication: Prescription for Vicodin and Zofran  Dressing care/ Wound VAC: Continue wound VAC for 1 week  Ambulatory devices: Walker or crutches wheelchair  Discharge to: Home.  Follow-up: In the office 1 week post operative.

## 2021-01-22 NOTE — Transfer of Care (Signed)
Immediate Anesthesia Transfer of Care Note  Patient: Shane [name]  Procedure(s) Performed: RIGHT FOOT TIBIOCALCANEAL FUSION (Right Ankle) 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 (Right )  Patient Location: PACU  Anesthesia Type:MAC and Regional  Level of Consciousness: drowsy and patient cooperative  Airway & Oxygen Therapy: Patient Spontanous Breathing and Patient connected to face mask oxygen  Post-op Assessment: Report given to RN and Post -op Vital signs reviewed and stable  Post vital signs: Reviewed and stable  Last Vitals:  Vitals Value Taken Time  BP 125/83 01/22/21 1400  Temp    Pulse 58 01/22/21 1401  Resp 15 01/22/21 1401  SpO2 100 % 01/22/21 1401  Vitals shown include unvalidated device data.  Last Pain:  Vitals:   01/22/21 1059  TempSrc: Oral  PainSc: 0-No pain         Complications: No complications documented.

## 2021-01-22 NOTE — Progress Notes (Signed)
Orthopedic Tech Progress Note Patient Details:  Shane [name] 05-04-1941 [id number] PACU RN called requesting a CAM WALKER BOOT. Ortho Devices Type of Ortho Device: CAM walker Ortho Device/Splint Location: RLE Ortho Device/Splint Interventions: Ordered,Application,Adjustment   Post Interventions Patient Tolerated: Well Instructions Provided: Care of device   [name] 01/22/2021, 2:56 PM

## 2021-01-22 NOTE — H&P (Signed)
Shane [name] is an 80 y.o. male.   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  Patient is status post revascularization by [name].  Patient is status post CABG at [facility] in December he developed endocarditis and is currently on a PICC line that he will complete February 21.  Past Medical History:  Diagnosis Date  . 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 Cataract   . Chronic kidney disease    d/t DM  . CKD (chronic kidney disease), stage III ([place])   . Diabetes mellitus    Vgo disposal insulin bolus  simular to insulin pump  . Dyspnea   . GERD (gastroesophageal reflux disease)   . History of kidney stones    passed  . Hyperlipidemia   . Hypertension   . Idiopathic pulmonary fibrosis ([place]) 11/2016  . ILD (interstitial lung disease) ([place])   . Moderate aortic stenosis    a. 10/2019 Echo: EF 55-60%, Gr2 DD. Nl RV.   Marland Kitchen Neuromuscular disorder ([place])   . Neuropathy associated with endocrine disorder ([place])   . Nonobstructive CAD (coronary artery disease)    a. 2012 Cath: mod, nonobs dzs; b. 10/2016 MV: EF 60%, no ischemia.  . OSA on CPAP 05/05/2017   Unattended Home Sleep Test 7/2/813-AHI 38.6/hour, desaturation to 64%, body weight 261 pounds  . PONV (postoperative nausea and vomiting)   . Sleep apnea     uses cpap asked to bring mask and tubing    Past Surgical History:  Procedure Laterality Date  . ABDOMINAL AORTOGRAM W/LOWER EXTREMITY N/A 12/10/2020   Procedure: ABDOMINAL AORTOGRAM W/LOWER EXTREMITY;  Surgeon: Cherre Robins, MD;  Location: [place] [facility];  Service: Cardiovascular;  Laterality: N/A;  . ANTERIOR FUSION CERVICAL SPINE  2012  . CARDIAC CATHETERIZATION  2011  . CARDIAC CATHETERIZATION N/A 11/09/2016   Procedure: Right Heart Cath;  Surgeon: Belva Crome, MD;  Location: [place] [facility];  Service: Cardiovascular;  Laterality: N/A;  .  carpel tunnel     left wrist  . CATARACT EXTRACTION    . CATARACT EXTRACTION W/ INTRAOCULAR LENS  IMPLANT, BILATERAL  2013  . CERVICAL LAMINECTOMY  2012  . COLONOSCOPY N/A 01/14/2013   Procedure: COLONOSCOPY;  Surgeon: Irene Shipper, MD;  [facility];  Service: Endoscopy;  Laterality: N/A;  . CORONARY ARTERY BYPASS GRAFT  11/04/2020   LIMA-LAD, SVG-OM1, SVG-PDA (Dr Marney Setting [facility]) dc 11/18/2020  . EYE SURGERY    . [place]   left  . LEFT HEART CATH AND CORONARY ANGIOGRAPHY N/A 07/10/2020   Procedure: LEFT HEART CATH AND CORONARY ANGIOGRAPHY;  Surgeon: Sherren Mocha, MD;  Location: [place] [facility];  Service: Cardiovascular;  Laterality: N/A;  . LUMBAR LAMINECTOMY  2003  . LUNG BIOPSY Left 12/26/2016   Procedure: LUNG BIOPSY;  Surgeon: [name], MD;  Location: [place];  Service: Thoracic;  Laterality: Left;  . PACEMAKER IMPLANT N/A 03/30/2020   Procedure: PACEMAKER IMPLANT;  Surgeon: Evans Lance, MD;  Location: [place] [facility];  Service: Cardiovascular;  Laterality: N/A;  . PERIPHERAL VASCULAR INTERVENTION Right 12/10/2020   Procedure: PERIPHERAL VASCULAR INTERVENTION;  Surgeon: Cherre Robins, MD;  Location: [place] [facility];  Service: Cardiovascular;  Laterality: Right;  SFA  . POSTERIOR FUSION CERVICAL SPINE  2012  . PPM GENERATOR REMOVAL N/A 12/14/2020  Procedure: PPM GENERATOR REMOVAL;  Surgeon: Evans Lance, MD;  Location: [place] [facility];  Service: Cardiovascular;  Laterality: N/A;  . TEE WITHOUT CARDIOVERSION N/A 12/11/2020   Procedure: TRANSESOPHAGEAL ECHOCARDIOGRAM (TEE);  Surgeon: Geralynn Rile, MD;  Location: [place];  Service: Cardiovascular;  Laterality: N/A;  . TRIGGER FINGER RELEASE  2011   4th finger left hand  . VIDEO ASSISTED THORACOSCOPY Left 12/26/2016   Procedure: VIDEO ASSISTED THORACOSCOPY;  Surgeon: [name], MD;  Location: [place];  Service: Thoracic;  Laterality: Left;  Marland Kitchen VIDEO BRONCHOSCOPY N/A  12/26/2016   Procedure: VIDEO BRONCHOSCOPY;  Surgeon: [name], MD;  Location: [facility] OR;  Service: Thoracic;  Laterality: N/A;    Family History  Problem Relation Age of Onset  . Diabetes Mellitus II Mother   . Emphysema Father 78  . Heart attack Father   . Colon cancer Neg Hx   . Esophageal cancer Neg Hx   . Rectal cancer Neg Hx   . Stomach cancer Neg Hx    Social History:  reports that he has never smoked. He has never used smokeless tobacco. He reports that he does not drink alcohol and does not use drugs.  Allergies:  Allergies  Allergen Reactions  . Codeine Hives and Itching  . Ofev [Nintedanib] Diarrhea    SEVERE DIARRHEA  . Other Diarrhea and Other (See Comments)    Esbriet causes elevated LFTs. D/C on 06/14/17 and SEVERE DIARRHEA    No medications prior to admission.    Results for orders placed or performed during the hospital encounter of 01/20/21 (from the past 48 hour(s))  SARS CORONAVIRUS 2 (TAT 6-24 HRS) Nasopharyngeal Nasopharyngeal Swab     Status: None   Collection Time: 01/20/21  1:14 PM   Specimen: Nasopharyngeal Swab  Result Value Ref Range   SARS Coronavirus 2 NEGATIVE NEGATIVE    Comment: (NOTE) SARS-CoV-2 target nucleic acids are NOT DETECTED.  The SARS-CoV-2 RNA is generally detectable in upper and lower respiratory specimens during the acute phase of infection. Negative results do not preclude SARS-CoV-2 infection, do not rule out co-infections with other pathogens, and should not be used as the sole basis for treatment or other patient management decisions. Negative results must be combined with clinical observations, patient history, and epidemiological information. The expected result is Negative.  Fact Sheet for Patients: SugarRoll.be  Fact Sheet for Healthcare Providers: https://www.woods-mathews.com/  This test is not yet approved or cleared by the [place] FDA and  has been  authorized for detection and/or diagnosis of SARS-CoV-2 by FDA under an Emergency Use Authorization (EUA). This EUA will remain  in effect (meaning this test can be used) for the duration of the COVID-19 declaration under Se ction 564(b)(1) of the Act, 21 U.S.C. section 360bbb-3(b)(1), unless the authorization is terminated or revoked sooner.  Performed at [facility], [place] [address], [place], [place] [address]    No results found.  Review of Systems  All other systems reviewed and are negative.   There were no vitals taken for this visit. Physical Exam  Patient is alert, oriented, no adenopathy, well-dressed, normal affect, normal respiratory effort. Examination patient does have biphasic pulses by Doppler at the ankle.  His dorsalis pedis pulse.  He has a necrotic ulcer over the fifth metatarsal head with an MRI scan showing osteomyelitis.  His ankle-brachial indices showed noncompressible vessels he has had a pacemaker removed.  Examination of the foot patient has a cavovarus fixed  foot drop deformity essentially ambulating on the lateral aspect of his foot he states that this nerve injury was secondary to cervical spine fusion.  He does have venous swelling of the right foot.  He states he has been diabetic for 40 years does not smoke he states his last hemoglobin A1c is 7.Heart RRR lungs Clear Assessmen 1. Osteomyelitis of fifth toe of right foot ([place])     Plan: With patient's cavovarus fixed foot drop and osteomyelitis of the fifth ray patient will need 1/5 ray amputation.  With the foot drop patient will need a plantigrade foot and will plan for tibial calcaneal fusion at the same time.  Risks and benefits were discussed including infection neurovascular injury nonhealing of the fusion nonhealing the amputation need for additional surgery.  Patient states he understands wished to proceed at this time. t/Plan   Bevely Palmer Persons, PA 01/22/2021, 6:41 AM

## 2021-01-22 NOTE — Telephone Encounter (Signed)
Send a fax of the op note to Dr. [name].

## 2021-01-22 NOTE — Anesthesia Procedure Notes (Signed)
Anesthesia Regional Block: Popliteal block   Pre-Anesthetic Checklist: ,, timeout performed, Correct Patient, Correct Site, Correct Laterality, Correct Procedure, Correct Position, site marked, Risks and benefits discussed,  Surgical consent,  Pre-op evaluation,  At surgeon's request and post-op pain management  Laterality: Right  Prep: Dura Prep       Needles:  Injection technique: Single-shot  Needle Type: Echogenic Stimulator Needle     Needle Length: 9cm  Needle Gauge: 20     Additional Needles:   Procedures:,,,, ultrasound used (permanent image in chart),,,,  Narrative:  Start time: 01/22/2021 11:40 AM End time: 01/22/2021 11:44 AM Injection made incrementally with aspirations every 5 mL.  Performed by: Personally  Anesthesiologist: Darral Dash, DO  Additional Notes: Patient identified. Risks/Benefits/Options discussed with patient including but not limited to bleeding, infection, nerve damage, failed block, incomplete pain control. Patient expressed understanding and wished to proceed. All questions were answered. Sterile technique was used throughout the entire procedure. Please see nursing notes for vital signs. Aspirated in 5cc intervals with injection for negative confirmation. Patient was given instructions on fall risk and not to get out of bed. All questions and concerns addressed with instructions to call with any issues or inadequate analgesia.

## 2021-01-22 NOTE — Anesthesia Procedure Notes (Addendum)
Procedure Name: MAC Date/Time: 01/22/2021 12:40 PM Performed by: Georgia Duff, CRNA Pre-anesthesia Checklist: Patient identified, Emergency Drugs available, Suction available, Patient being monitored and Timeout performed Patient Re-evaluated:Patient Re-evaluated prior to induction Oxygen Delivery Method: Simple face mask Induction Type: IV induction Placement Confirmation: positive ETCO2 Dental Injury: Teeth and Oropharynx as per pre-operative assessment

## 2021-01-22 NOTE — Telephone Encounter (Signed)
Patient's wife called. Would like Dr. Sharol Given to call Dr. [name] with a report of the surgery. His number is [phone]

## 2021-01-23 NOTE — Anesthesia Postprocedure Evaluation (Signed)
Anesthesia Post Note  Patient: Shane [name]  Procedure(s) Performed: RIGHT FOOT TIBIOCALCANEAL FUSION (Right Ankle) 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 (Right )     Patient location during evaluation: PACU Anesthesia Type: Regional Level of consciousness: awake Pain management: pain level controlled Vital Signs Assessment: post-procedure vital signs reviewed and stable Respiratory status: spontaneous breathing, nonlabored ventilation, respiratory function stable and patient connected to nasal cannula oxygen Cardiovascular status: stable and blood pressure returned to baseline Postop Assessment: no apparent nausea or vomiting Anesthetic complications: no   No complications documented.  Last Vitals:  Vitals:   01/22/21 1415 01/22/21 1430  BP: (!) 116/59 (!) 153/67  Pulse: (!) 59 (!) 58  Resp: 16 13  Temp:    SpO2: 97% 100%    Last Pain:  Vitals:   01/22/21 1400  TempSrc:   PainSc: 0-No pain                 Humphrey Guerreiro P Jakyri Brunkhorst

## 2021-01-25 ENCOUNTER — Telehealth: Payer: Self-pay | Admitting: Orthopedic Surgery

## 2021-01-25 ENCOUNTER — Encounter: Payer: Self-pay | Admitting: Orthopedic Surgery

## 2021-01-25 ENCOUNTER — Ambulatory Visit (INDEPENDENT_AMBULATORY_CARE_PROVIDER_SITE_OTHER): Payer: Medicare Other | Admitting: Physician Assistant

## 2021-01-25 VITALS — Ht 72.0 in | Wt 241.0 lb

## 2021-01-25 DIAGNOSIS — M869 Osteomyelitis, unspecified: Secondary | ICD-10-CM

## 2021-01-25 NOTE — Telephone Encounter (Signed)
Faxed to [phone] per request.

## 2021-01-25 NOTE — Telephone Encounter (Signed)
Pt wife called and said the pts wound vac line has stopped working. The machine started blinking and needs someone to give her a call [phone].

## 2021-01-25 NOTE — Telephone Encounter (Signed)
I called and sw pt's wife. Appt made for today at 1:30

## 2021-01-25 NOTE — Progress Notes (Signed)
Office Visit Note   Patient: Shane [name]           Date of Birth: 10/01/1941           MRN: [id number] Visit Date: 01/25/2021              Requested by: Burnard Bunting, MD [address] [place],  [place] [address] PCP: Burnard Bunting, MD  Chief Complaint  Patient presents with  . Right Foot - Routine Post Op    01/22/2021 Right foot tibiocalcaneal fusion, right 5th ray amputation      HPI:  in follow-up today he is 3 days status post right tibial calcaneal fusion and fifth ray amputation.  His wound VAC stopped functioning over the weekend.  Assessment & Plan: Visit Diagnoses: No diagnosis found.  Plan: We will replace dressing today.  May begin daily dressing changes and getting the area wet with antibacterial soap and water at 1 week postop.  For now we will place a nonstick dressing.  Patient's family is interested in having wound care do this rather than them.  Emphasized the importance of elevating his foot.  X-ray should be obtained at his next visit of his ankle  Follow-Up Instructions: No follow-ups on file.   Ortho Exam  Patient is alert, oriented, no adenopathy, well-dressed, normal affect, normal respiratory effort. Overall well approximated incisions.  She some wound dehiscence at the distal end of the ray amputation.  Bloody drainage.  No cellulitis no foul odor moderate soft tissue swelling  Imaging: No results found. No images are attached to the encounter.  Labs: Lab Results  Component Value Date   HGBA1C 6.6 (H) 12/09/2020   HGBA1C 7.6 (H) 03/28/2020   HGBA1C 7.9 (H) 12/19/2016   ESRSEDRATE 22 (H) 10/14/2016   CRP 8.3 (H) 12/09/2020   CRP 4.1 (H) 07/17/2020   REPTSTATUS 01/07/2021 FINAL 01/01/2021   GRAMSTAIN  01/01/2021    NO WBC SEEN ABUNDANT GRAM POSITIVE COCCI Performed at [facility], [place] [address] [place], [place] [address]    CULT  01/01/2021    ABUNDANT SERRATIA MARCESCENS RARE ENTEROCOCCUS FAECALIS MIXED ANAEROBIC  FLORA PRESENT.  CALL LAB IF FURTHER IID REQUIRED.    LABORGA SERRATIA MARCESCENS 01/01/2021   LABORGA ENTEROCOCCUS FAECALIS 01/01/2021     Lab Results  Component Value Date   ALBUMIN 2.0 (L) 12/16/2020   ALBUMIN 2.5 (L) 12/09/2020   ALBUMIN 3.0 (L) 12/08/2020    Lab Results  Component Value Date   MG 1.7 12/16/2020   MG 1.9 12/09/2020   MG 1.7 12/08/2020   No results found for: VD25OH  No results found for: PREALBUMIN CBC EXTENDED Latest Ref Rng & Units 01/22/2021 12/16/2020 12/15/2020  WBC 4.0 - 10.5 K/uL 9.7 7.9 9.2  RBC 4.22 - 5.81 MIL/uL 3.96(L) 2.83(L) 2.72(L)  HGB 13.0 - 17.0 g/dL 11.5(L) 8.8(L) 8.4(L)  HCT 39.0 - 52.0 % 36.7(L) 26.9(L) 25.7(L)  PLT 150 - 400 K/uL 283 242 204  NEUTROABS 1.7 - 7.7 K/uL - 5.4 -  LYMPHSABS 0.7 - 4.0 K/uL - 1.1 -     Body mass index is 32.69 kg/m.  Orders:  No orders of the defined types were placed in this encounter.  No orders of the defined types were placed in this encounter.    Procedures: No procedures performed  Clinical Data: No additional findings.  ROS:  All other systems negative, except as noted in the HPI. Review of Systems  Objective: Vital Signs: Ht 6' (  1.829 m)   Wt 241 lb (109.3 kg)   BMI 32.69 kg/m   Specialty Comments:  No specialty comments available.  PMFS History: Patient Active Problem List   Diagnosis Date Noted  . 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 Peripheral vascular disease ([place]) 12/29/2020  . CHF (congestive heart failure), NYHA class II, chronic, diastolic ([place]) 81/82/9937  . History of COVID-19 12/22/2020  . SSS (sick sinus syndrome) ([place]) 12/22/2020  . Acute bacterial endocarditis   . MSSA bacteremia 12/09/2020  . Diabetic foot ulcer ([place]) 12/09/2020  . Foot drop, right foot   . Generalized weakness 12/08/2020  . Dehydration with hyponatremia 12/08/2020  . Atrial fibrillation, chronic ([place]) 12/08/2020  . Elevated troponin level not due myocardial infarction 12/08/2020  . Adrenal  insufficiency ([place]) 11/14/2020  . Orthostatic hypotension 11/14/2020  . Physical deconditioning 11/14/2020  . Difficulty sleeping 11/07/2020  . Postoperative anemia due to acute blood loss 11/07/2020  . Right ventricular dysfunction 11/05/2020  . 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 Hearing loss 11/03/2020  . Cardiac device in situ 09/09/2020  . Coronary atherosclerosis due to lipid rich plaque 09/02/2020  . COVID-19 virus infection 07/18/2020  . Coronary artery disease involving native heart without angina pectoris 07/10/2020  . Chronic kidney disease, stage 3a ([place]) 03/29/2020  . 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 ([place]) 03/16/2020  . Right epiretinal membrane 03/16/2020  . Posterior vitreous detachment of right eye 03/16/2020  . Aortic stenosis, moderate 03/12/2020  . 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 Near syncope 04/27/2018  . Hypoglycemia due to insulin 01/06/2018  . Essential hypertension 01/06/2018  . Diabetes mellitus type 2, with complication, on long term insulin pump ([place]) 01/06/2018  . Syncope and collapse 01/05/2018  . Encounter for therapeutic drug monitoring 06/29/2017  . OSA on CPAP 05/05/2017  . 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[place]) 01/05/2017  . Abnormal chest x-ray 10/12/2016  . Benign neoplasm of colon 01/14/2013   Past Medical History:  Diagnosis Date  . 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 Cataract   . Chronic kidney disease    d/t DM  . CKD (chronic kidney disease), stage III ([place])   . Diabetes mellitus    Vgo disposal insulin bolus  simular to insulin pump  . Dyspnea   . GERD (gastroesophageal reflux disease)   . History of kidney stones    passed  . Hyperlipidemia   . Hypertension   . Idiopathic pulmonary fibrosis ([place]) 11/2016  . ILD  (interstitial lung disease) ([place])   . Moderate aortic stenosis    a. 10/2019 Echo: EF 55-60%, Gr2 DD. Nl RV.   Marland Kitchen Neuromuscular disorder ([place])   . Neuropathy associated with endocrine disorder ([place])   . Nonobstructive CAD (coronary artery disease)    a. 2012 Cath: mod, nonobs dzs; b. 10/2016 MV: EF 60%, no ischemia.  . OSA on CPAP 05/05/2017   Unattended Home Sleep Test 7/2/813-AHI 38.6/hour, desaturation to 64%, body weight 261 pounds  . PONV (postoperative nausea and vomiting)   . Sleep apnea     uses cpap asked to bring mask and tubing    Family History  Problem Relation Age of Onset  . Diabetes Mellitus II Mother   . Emphysema Father 34  . Heart attack Father   . Colon cancer Neg Hx   .  Esophageal cancer Neg Hx   . Rectal cancer Neg Hx   . Stomach cancer Neg Hx     Past Surgical History:  Procedure Laterality Date  . ABDOMINAL AORTOGRAM W/LOWER EXTREMITY N/A 12/10/2020   Procedure: ABDOMINAL AORTOGRAM W/LOWER EXTREMITY;  Surgeon: Cherre Robins, MD;  Location: [place] [facility];  Service: Cardiovascular;  Laterality: N/A;  . ANTERIOR FUSION CERVICAL SPINE  2012  . CARDIAC CATHETERIZATION  2011  . CARDIAC CATHETERIZATION N/A 11/09/2016   Procedure: Right Heart Cath;  Surgeon: Belva Crome, MD;  Location: [place] [facility];  Service: Cardiovascular;  Laterality: N/A;  . carpel tunnel     left wrist  . CATARACT EXTRACTION    . CATARACT EXTRACTION W/ INTRAOCULAR LENS  IMPLANT, BILATERAL  2013  . CERVICAL LAMINECTOMY  2012  . COLONOSCOPY N/A 01/14/2013   Procedure: COLONOSCOPY;  Surgeon: Irene Shipper, MD;  [facility];  Service: Endoscopy;  Laterality: N/A;  . CORONARY ARTERY BYPASS GRAFT  11/04/2020   LIMA-LAD, SVG-OM1, SVG-PDA (Dr Marney Setting [facility]) dc 11/18/2020  . EYE SURGERY    . [place]   left  . LEFT HEART CATH AND CORONARY ANGIOGRAPHY N/A 07/10/2020   Procedure: LEFT HEART CATH AND CORONARY ANGIOGRAPHY;  Surgeon: Sherren Mocha, MD;  Location: [place] [facility];  Service: Cardiovascular;  Laterality: N/A;  . LUMBAR LAMINECTOMY  2003  . LUNG BIOPSY Left 12/26/2016   Procedure: LUNG BIOPSY;  Surgeon: [name], MD;  Location: [place];  Service: Thoracic;  Laterality: Left;  . PACEMAKER IMPLANT N/A 03/30/2020   Procedure: PACEMAKER IMPLANT;  Surgeon: Evans Lance, MD;  Location: [place] [facility];  Service: Cardiovascular;  Laterality: N/A;  . PERIPHERAL VASCULAR INTERVENTION Right 12/10/2020   Procedure: PERIPHERAL VASCULAR INTERVENTION;  Surgeon: Cherre Robins, MD;  Location: [place] [facility];  Service: Cardiovascular;  Laterality: Right;  SFA  . POSTERIOR FUSION CERVICAL SPINE  2012  . PPM GENERATOR REMOVAL N/A 12/14/2020   Procedure: PPM GENERATOR REMOVAL;  Surgeon: Evans Lance, MD;  Location: [place] [facility];  Service: Cardiovascular;  Laterality: N/A;  . TEE WITHOUT CARDIOVERSION N/A 12/11/2020   Procedure: TRANSESOPHAGEAL ECHOCARDIOGRAM (TEE);  Surgeon: Geralynn Rile, MD;  Location: [place];  Service: Cardiovascular;  Laterality: N/A;  . TRIGGER FINGER RELEASE  2011   4th finger left hand  . VIDEO ASSISTED THORACOSCOPY Left 12/26/2016   Procedure: VIDEO ASSISTED THORACOSCOPY;  Surgeon: [name], MD;  Location: [place];  Service: Thoracic;  Laterality: Left;  Marland Kitchen VIDEO BRONCHOSCOPY N/A 12/26/2016   Procedure: VIDEO BRONCHOSCOPY;  Surgeon: [name], MD;  Location: [place];  Service: Thoracic;  Laterality: N/A;   Social History   Occupational History  . Not on file  Tobacco Use  . Smoking status: Never Smoker  . Smokeless tobacco: Never Used  Vaping Use  . Vaping Use: Never used  Substance and Sexual Activity  . Alcohol use: No  . Drug use: No  . Sexual activity: Not Currently

## 2021-01-26 ENCOUNTER — Other Ambulatory Visit: Payer: Self-pay

## 2021-01-26 ENCOUNTER — Telehealth: Payer: Self-pay

## 2021-01-26 NOTE — Telephone Encounter (Signed)
Called and sw [place] with Alvis Lemmings and she requested that wound care orders are faxed to [phone] to her attention. This was faxed this morning and to call with any questions.

## 2021-01-26 NOTE — Telephone Encounter (Signed)
Shane [name]  [phone] to call to see if [facility] can see the pt three times a week for dressing changes to ankle fusion and toe amputation. Tried number given by the pt [phone] and there was no answer and no vm. Will hold this pt pt was evaluated in the office yesterday will continue to work on [facility].

## 2021-01-27 ENCOUNTER — Telehealth: Payer: Self-pay | Admitting: Internal Medicine

## 2021-01-27 ENCOUNTER — Encounter (HOSPITAL_COMMUNITY): Payer: Self-pay | Admitting: Orthopedic Surgery

## 2021-01-27 ENCOUNTER — Encounter (INDEPENDENT_AMBULATORY_CARE_PROVIDER_SITE_OTHER): Payer: Medicare Other | Admitting: Ophthalmology

## 2021-01-27 NOTE — Telephone Encounter (Signed)
Per Dr Juleen China, verbal orders relayed to Tim at [facility] to draw BMP tomorrow and pull PICC. Order repeated and verified. Landis Gandy, RN

## 2021-01-27 NOTE — Telephone Encounter (Signed)
Reviewed patient's chart this afternoon to follow-up on his recent surgical procedure with Dr. Sharol Given.  He underwent right fifth ray amputation with local tissue rearrangement for wound closure on 01/22/2021 as a definitive surgery for osteomyelitis of the fifth metatarsal head.  In review of his operative note it appears that there was no deep abscess and the tissue margins were healthy.  No cultures were obtained, however, patient continued IV antibiotics for approximately 3 days postoperatively to complete treatment for MSSA bacteremia and PPM endocarditis status post device extraction 12/14/2020.    He has now completed 6 weeks of therapy from date of his pacemaker removal and an additional 3 days after source control for his fifth metatarsal head osteomyelitis.  Unfortunately, he had OPAT labs drawn on 01/25/2021 which shows a worsening of his chronic renal insufficiency.  His creatinine has increased from a baseline of about 1.4 up to 2.6.  Discussed with patient and his wife regarding the need for repeat lab measurement and continued follow-up for this.  Will have home health go out tomorrow for repeat BMP when they pull his PICC line.  I will follow up these results as well as communicate with his PCP regarding this.  Patient reports that he has been taking NSAIDs postoperatively and he was advised to discontinue this and to avoid other nephrotoxins.   Raynelle [place] for Infectious Disease [facility] 01/27/2021, 5:14 PM

## 2021-01-28 ENCOUNTER — Telehealth: Payer: Self-pay | Admitting: Orthopedic Surgery

## 2021-01-28 NOTE — Telephone Encounter (Signed)
I called and sw [facility]N to advise that yes the pt should be changing dressing on days that nurse does not come. I had given pt dressing supplies and advised during office visit with pt and wife. Asked HHN to also show again and just provide additional support  she will call today and advise of our conversation and will see the pt tomorrow. Of note he also has a purple pressure area at heel. Had advised to float the heels and avoid resting on any surface to not strap fx boot too tight. She is going to apply a hydrocolloid dressing to the area and pt has an appt on Monday.  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 Will call with any other questions and again advised that she will call and speak with pt's wife about all of the above.

## 2021-01-28 NOTE — Telephone Encounter (Signed)
Angel with [facility] called wanting to clarify wound care orders; she states Dr. Sharol Given ordered a home health nurse to go in 3x a week and teach the family how to do wound care. Glenard Haring is wanting to know if the family is supposed to be doing wound care the days the nurse doesn't go? She would also like to know if they should be cleaning the wound before they apply the creams? If so with what?  Angel CB# [phone]

## 2021-01-28 NOTE — Telephone Encounter (Signed)
Patient's wife called. She would like to know if he is able to take the boot off while sleeping. Her call back number is [phone]

## 2021-01-28 NOTE — Telephone Encounter (Signed)
I called and sw pt's wife and advised that per Dr. Sharol Given it is ok to remove the boot and float the heels on the bed a night. Voiced understanding and will call with any questions.

## 2021-01-28 NOTE — Telephone Encounter (Signed)
Patient's wife called. Would like to know how often the dressing should be changed. Her call back number is [phone]

## 2021-01-28 NOTE — Telephone Encounter (Signed)
[facility]N calling pt see previous message.

## 2021-01-29 NOTE — Telephone Encounter (Signed)
Spoke to [place] at [employer], he will fax over repeat BMP.   Beryle Flock, RN

## 2021-01-29 NOTE — Telephone Encounter (Signed)
Careers information officer (home health agency) to inquire about labs that were re-drawn Spoke with Mudlogger of Clinical operations - Ebony Hail. She re-drew them yesterday and dropped them off this morning with [facility]. PCP added additional labs (CBC with diff + UA) on top of repeat BMP. Awaiting the results of these and will forward them once received.   Divante Kotch Lorita Officer, RN

## 2021-02-01 ENCOUNTER — Encounter: Payer: Self-pay | Admitting: Orthopedic Surgery

## 2021-02-01 ENCOUNTER — Ambulatory Visit (INDEPENDENT_AMBULATORY_CARE_PROVIDER_SITE_OTHER): Payer: Medicare Other | Admitting: Orthopedic Surgery

## 2021-02-01 VITALS — Ht 72.0 in | Wt 241.0 lb

## 2021-02-01 DIAGNOSIS — Z981 Arthrodesis status: Secondary | ICD-10-CM

## 2021-02-01 DIAGNOSIS — Z89421 Acquired absence of other right toe(s): Secondary | ICD-10-CM

## 2021-02-01 NOTE — Progress Notes (Signed)
Office Visit Note   Patient: Shane [name]           Date of Birth: Apr 19, 1941           MRN: [id number] Visit Date: 02/01/2021              Requested by: Burnard Bunting, MD [address] [place],  [place] [address] PCP: Burnard Bunting, MD  Chief Complaint  Patient presents with  . Right Foot - Follow-up    Right tibiocalcaneal fusion and 5th ray amputation 01/22/2021      HPI: Patient presents approximately 10 days status post right foot fifth ray amputation and tibial calcaneal fusion.  Patient states his pain comes and goes.  Assessment & Plan: Visit Diagnoses:  1. 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 S/P ankle fusion     Plan: Reviewed the importance of keeping pressure off his heel we will place him in a PRAFO boot recommended dry dressing change daily pad the first metatarsal head gauze over the incision and gauze laterally.  Three-view radiographs of the right ankle at follow-up.   Follow-Up Instructions: Return in about 1 week (around 02/08/2021).   Ortho Exam  Patient is alert, oriented, no adenopathy, well-dressed, normal affect, normal respiratory effort. Examination the dorsal surgical incision is healing nicely there is no cellulitis no drainage no signs of infection.  Patient has developed a new large decubitus heel ulcer from pressure on his heel this is approximately 5 cm in diameter and appears to be a superficial blood blister.  Patient is also developed some ischemic changes medially over the MTP joint of the great toe the ischemic ulcer over the IP joint of the great toe is actually better.  There is mild ischemic changes over the lateral ray amputation for the fifth ray and the ischemic changes are more proximally.  Again discussed the importance of floating the heel and foot off the ground to prevent any pressure.  Imaging: No results found. No images are attached to the encounter.  Labs: Lab Results   Component Value Date   HGBA1C 6.6 (H) 12/09/2020   HGBA1C 7.6 (H) 03/28/2020   HGBA1C 7.9 (H) 12/19/2016   ESRSEDRATE 22 (H) 10/14/2016   CRP 8.3 (H) 12/09/2020   CRP 4.1 (H) 07/17/2020   REPTSTATUS 01/07/2021 FINAL 01/01/2021   GRAMSTAIN  01/01/2021    NO WBC SEEN ABUNDANT GRAM POSITIVE COCCI Performed at [facility], [place] [address], [place], [place] [address]    CULT  01/01/2021    ABUNDANT SERRATIA MARCESCENS RARE ENTEROCOCCUS FAECALIS MIXED ANAEROBIC FLORA PRESENT.  CALL LAB IF FURTHER IID REQUIRED.    LABORGA SERRATIA MARCESCENS 01/01/2021   LABORGA ENTEROCOCCUS FAECALIS 01/01/2021     Lab Results  Component Value Date   ALBUMIN 2.0 (L) 12/16/2020   ALBUMIN 2.5 (L) 12/09/2020   ALBUMIN 3.0 (L) 12/08/2020    Lab Results  Component Value Date   MG 1.7 12/16/2020   MG 1.9 12/09/2020   MG 1.7 12/08/2020   No results found for: VD25OH  No results found for: PREALBUMIN CBC EXTENDED Latest Ref Rng & Units 01/22/2021 12/16/2020 12/15/2020  WBC 4.0 - 10.5 K/uL 9.7 7.9 9.2  RBC 4.22 - 5.81 MIL/uL 3.96(L) 2.83(L) 2.72(L)  HGB 13.0 - 17.0 g/dL 11.5(L) 8.8(L) 8.4(L)  HCT 39.0 - 52.0 % 36.7(L) 26.9(L) 25.7(L)  PLT 150 - 400 K/uL 283 242 204  NEUTROABS 1.7 - 7.7 K/uL -  5.4 -  LYMPHSABS 0.7 - 4.0 K/uL - 1.1 -     Body mass index is 32.69 kg/m.  Orders:  No orders of the defined types were placed in this encounter.  No orders of the defined types were placed in this encounter.    Procedures: No procedures performed  Clinical Data: No additional findings.  ROS:  All other systems negative, except as noted in the HPI. Review of Systems  Objective: Vital Signs: Ht 6' (1.829 m)   Wt 241 lb (109.3 kg)   BMI 32.69 kg/m   Specialty Comments:  No specialty comments available.  PMFS History: Patient Active Problem List   Diagnosis Date Noted  . 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 Peripheral vascular disease ([place]) 12/29/2020  . CHF (congestive heart  failure), NYHA class II, chronic, diastolic ([place]) 75/09/2584  . History of COVID-19 12/22/2020  . SSS (sick sinus syndrome) ([place]) 12/22/2020  . Acute bacterial endocarditis   . MSSA bacteremia 12/09/2020  . Diabetic foot ulcer ([place]) 12/09/2020  . Foot drop, right foot   . Generalized weakness 12/08/2020  . Dehydration with hyponatremia 12/08/2020  . Atrial fibrillation, chronic ([place]) 12/08/2020  . Elevated troponin level not due myocardial infarction 12/08/2020  . Adrenal insufficiency ([place]) 11/14/2020  . Orthostatic hypotension 11/14/2020  . Physical deconditioning 11/14/2020  . Difficulty sleeping 11/07/2020  . Postoperative anemia due to acute blood loss 11/07/2020  . Right ventricular dysfunction 11/05/2020  . 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 Hearing loss 11/03/2020  . Cardiac device in situ 09/09/2020  . Coronary atherosclerosis due to lipid rich plaque 09/02/2020  . COVID-19 virus infection 07/18/2020  . Coronary artery disease involving native heart without angina pectoris 07/10/2020  . Chronic kidney disease, stage 3a ([place]) 03/29/2020  . 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 ([place]) 03/16/2020  . Right epiretinal membrane 03/16/2020  . Posterior vitreous detachment of right eye 03/16/2020  . Aortic stenosis, moderate 03/12/2020  . 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 Near syncope 04/27/2018  . Hypoglycemia due to insulin 01/06/2018  . Essential hypertension 01/06/2018  . Diabetes mellitus type 2, with complication, on long term insulin pump ([place]) 01/06/2018  . Syncope and collapse 01/05/2018  . Encounter for therapeutic drug monitoring 06/29/2017  . OSA on CPAP 05/05/2017  . 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[place]) 01/05/2017  . Abnormal chest x-ray  10/12/2016  . Benign neoplasm of colon 01/14/2013   Past Medical History:  Diagnosis Date  . 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 Cataract   . Chronic kidney disease    d/t DM  . CKD (chronic kidney disease), stage III ([place])   . Diabetes mellitus    Vgo disposal insulin bolus  simular to insulin pump  . Dyspnea   . GERD (gastroesophageal reflux disease)   . History of kidney stones    passed  . Hyperlipidemia   . Hypertension   . Idiopathic pulmonary fibrosis ([place]) 11/2016  . ILD (interstitial lung disease) ([place])   . Moderate aortic stenosis    a. 10/2019 Echo: EF 55-60%, Gr2 DD. Nl RV.   Marland Kitchen Neuromuscular disorder ([place])   . Neuropathy associated with endocrine disorder ([place])   . Nonobstructive CAD (coronary artery disease)    a. 2012 Cath: mod, nonobs dzs; b. 10/2016 MV: EF 60%,  no ischemia.  . OSA on CPAP 05/05/2017   Unattended Home Sleep Test 7/2/813-AHI 38.6/hour, desaturation to 64%, body weight 261 pounds  . PONV (postoperative nausea and vomiting)   . Sleep apnea     uses cpap asked to bring mask and tubing    Family History  Problem Relation Age of Onset  . Diabetes Mellitus II Mother   . Emphysema Father 32  . Heart attack Father   . Colon cancer Neg Hx   . Esophageal cancer Neg Hx   . Rectal cancer Neg Hx   . Stomach cancer Neg Hx     Past Surgical History:  Procedure Laterality Date  . ABDOMINAL AORTOGRAM W/LOWER EXTREMITY N/A 12/10/2020   Procedure: ABDOMINAL AORTOGRAM W/LOWER EXTREMITY;  Surgeon: Cherre Robins, MD;  Location: [facility];  Service: Cardiovascular;  Laterality: N/A;  . AMPUTATION Right 01/22/2021   Procedure: 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;  Surgeon: [name], MD;  Location: [facility];  Service: Orthopedics;  Laterality: Right;  . ANKLE FUSION Right 01/22/2021   Procedure: RIGHT FOOT TIBIOCALCANEAL FUSION;  Surgeon: [name], MD;  Location: [facility];  Service: Orthopedics;  Laterality: Right;  . ANTERIOR FUSION CERVICAL SPINE  2012  . CARDIAC  CATHETERIZATION  2011  . CARDIAC CATHETERIZATION N/A 11/09/2016   Procedure: Right Heart Cath;  Surgeon: Belva Crome, MD;  Location: [facility];  Service: Cardiovascular;  Laterality: N/A;  . carpel tunnel     left wrist  . CATARACT EXTRACTION    . CATARACT EXTRACTION W/ INTRAOCULAR LENS  IMPLANT, BILATERAL  2013  . CERVICAL LAMINECTOMY  2012  . COLONOSCOPY N/A 01/14/2013   Procedure: COLONOSCOPY;  Surgeon: Irene Shipper, MD;  Location: [facility];  Service: Endoscopy;  Laterality: N/A;  . CORONARY ARTERY BYPASS GRAFT  11/04/2020   LIMA-LAD, SVG-OM1, SVG-PDA (Dr [facility]) dc 11/18/2020  . EYE SURGERY    . [address]   left  . LEFT HEART CATH AND CORONARY ANGIOGRAPHY N/A 07/10/2020   Procedure: LEFT HEART CATH AND CORONARY ANGIOGRAPHY;  Surgeon: Sherren Mocha, MD;  Location: [facility];  Service: Cardiovascular;  Laterality: N/A;  . LUMBAR LAMINECTOMY  2003  . LUNG BIOPSY Left 12/26/2016   Procedure: LUNG BIOPSY;  Surgeon: [name], MD;  Location: [facility];  Service: Thoracic;  Laterality: Left;  . PACEMAKER IMPLANT N/A 03/30/2020   Procedure: PACEMAKER IMPLANT;  Surgeon: Evans Lance, MD;  Location: [facility];  Service: Cardiovascular;  Laterality: N/A;  . PERIPHERAL VASCULAR INTERVENTION Right 12/10/2020   Procedure: PERIPHERAL VASCULAR INTERVENTION;  Surgeon: Cherre Robins, MD;  Location: [facility];  Service: Cardiovascular;  Laterality: Right;  SFA  . POSTERIOR FUSION CERVICAL SPINE  2012  . PPM GENERATOR REMOVAL N/A 12/14/2020   Procedure: PPM GENERATOR REMOVAL;  Surgeon: Evans Lance, MD;  Location: [facility];  Service: Cardiovascular;  Laterality: N/A;  . TEE WITHOUT CARDIOVERSION N/A 12/11/2020   Procedure: TRANSESOPHAGEAL ECHOCARDIOGRAM (TEE);  Surgeon: Geralynn Rile, MD;  Location: [facility];  Service: Cardiovascular;  Laterality: N/A;  . TRIGGER FINGER RELEASE  2011   4th finger left hand  .  VIDEO ASSISTED THORACOSCOPY Left 12/26/2016   Procedure: VIDEO ASSISTED THORACOSCOPY;  Surgeon: [name], MD;  Location: [facility];  Service: Thoracic;  Laterality: Left;  Marland Kitchen VIDEO BRONCHOSCOPY N/A 12/26/2016   Procedure: VIDEO BRONCHOSCOPY;  Surgeon: [name],  MD;  Location: [facility];  Service: Thoracic;  Laterality: N/A;   Social History   Occupational History  . Not on file  Tobacco Use  . Smoking status: Never Smoker  . Smokeless tobacco: Never Used  Vaping Use  . Vaping Use: Never used  Substance and Sexual Activity  . Alcohol use: No  . Drug use: No  . Sexual activity: Not Currently

## 2021-02-02 ENCOUNTER — Telehealth: Payer: Self-pay | Admitting: Orthopedic Surgery

## 2021-02-02 ENCOUNTER — Ambulatory Visit: Payer: Medicare Other | Admitting: Cardiology

## 2021-02-02 NOTE — Telephone Encounter (Signed)
Did we ever get labs on him?  I logged into [facility] portal this morning and did not see anything there.  Thx!

## 2021-02-02 NOTE — Telephone Encounter (Signed)
I called and sw HHN to advise per Dr. Sharol Given to wash area daily with antibacterial soap and apply dry dressing change daily. To use PRAFO boot to off load pressure on the heel. Pt has an appt to come back in the office next Tuesday for eval. To call with any other questions.

## 2021-02-02 NOTE — Telephone Encounter (Signed)
Still waiting. [facility] called the home health nurse and they're waiting to hear back.

## 2021-02-02 NOTE — Telephone Encounter (Signed)
Angel with [facility] called stating she need some clarification on the wound care orders for this pt; she would like a CB  [phone]

## 2021-02-03 NOTE — Telephone Encounter (Signed)
Contacted [facility] to have them fax lab results to [place]. Gave results to provider for review. Faxed copy to [facility] as well.   Yaire Kreher Lorita Officer, RN

## 2021-02-03 NOTE — Telephone Encounter (Signed)
Potassium of 5.8 noted on labs drawn last week. Patient and wife notified of elevation and instructed to reach out to his pcp for medication review and assessment. Patient does not report any symptoms or concerns.   Cherry Wittwer Lorita Officer, RN

## 2021-02-09 ENCOUNTER — Ambulatory Visit (INDEPENDENT_AMBULATORY_CARE_PROVIDER_SITE_OTHER): Payer: Medicare Other

## 2021-02-09 ENCOUNTER — Ambulatory Visit (INDEPENDENT_AMBULATORY_CARE_PROVIDER_SITE_OTHER): Payer: Medicare Other | Admitting: Orthopedic Surgery

## 2021-02-09 DIAGNOSIS — Z981 Arthrodesis status: Secondary | ICD-10-CM | POA: Diagnosis not present

## 2021-02-09 DIAGNOSIS — Z89421 Acquired absence of other right toe(s): Secondary | ICD-10-CM

## 2021-02-12 ENCOUNTER — Ambulatory Visit (INDEPENDENT_AMBULATORY_CARE_PROVIDER_SITE_OTHER): Payer: Medicare Other | Admitting: Family

## 2021-02-12 ENCOUNTER — Encounter: Payer: Self-pay | Admitting: Family

## 2021-02-12 DIAGNOSIS — Z89431 Acquired absence of right foot: Secondary | ICD-10-CM

## 2021-02-12 DIAGNOSIS — Z981 Arthrodesis status: Secondary | ICD-10-CM

## 2021-02-12 MED ORDER — GABAPENTIN 100 MG PO CAPS: 100.0000 mg | ORAL_CAPSULE | Freq: Three times a day (TID) | ORAL | 0 refills | Status: DC

## 2021-02-12 NOTE — Progress Notes (Signed)
Office Visit Note   Patient: Shane [name]           Date of Birth: 1941-04-23           MRN: [id number] Visit Date: 02/12/2021              Requested by: Burnard Bunting, MD [address] [place],  [place] [address] PCP: Burnard Bunting, MD  Chief Complaint  Patient presents with  . Right Foot - Routine Post Op    01/22/21 right tib-cal fusion and 5th ray amputation       HPI: The patient is a 80 year old gentleman seen 3 weeks status post right tibiocalcaneal fusion with fifth ray amputation.  He has been in a Dynaflex compression wrap since last visit.  Is wearing a PRAFO to offload his heel.  Unfortunately has ischemic areas to his heel as well as his medial border of his MTP joint  Assessment & Plan: Visit Diagnoses:  1. S/P ankle fusion   2. Partial nontraumatic amputation of right foot ([place])     Plan: Silver cell over the right amputation incision we will reapply the Dynaflex compression wrap.  Offload his heel as well as the ulcerative areas.  Elevate for edema.  Will trial gabapentin for his nerve pain  Follow-Up Instructions: Return as scheduled.   Ortho Exam  Patient is alert, oriented, no adenopathy, well-dressed, normal affect, normal respiratory effort. On examination of the right lower extremity there is good wrinkling of the skin from his compression garment no erythema no warmth.  The dorsal ankle incision is healing well sutures are in place.  Along the fifth ray amputation site there is some slight gaping and ulceration just dorsal to the incision ulcer is filled in with 50% eschar 50% fibrinous exudative tissue there is no active drainage.  This does not probe.  Over his heel there is a 3 cm in diameter pressure wound covered with eschar.  He also has a nickel sized pressure area over his medial border of the MTP joint neither of these are open there is no bleeding or drainage no erythema no sign of infection of the pressure injuries.  Imaging: No  results found. No images are attached to the encounter.  Labs: Lab Results  Component Value Date   HGBA1C 6.6 (H) 12/09/2020   HGBA1C 7.6 (H) 03/28/2020   HGBA1C 7.9 (H) 12/19/2016   ESRSEDRATE 22 (H) 10/14/2016   CRP 8.3 (H) 12/09/2020   CRP 4.1 (H) 07/17/2020   REPTSTATUS 01/07/2021 FINAL 01/01/2021   GRAMSTAIN  01/01/2021    NO WBC SEEN ABUNDANT GRAM POSITIVE COCCI Performed at [facility], [place] [address] [place], [place] [address]    CULT  01/01/2021    ABUNDANT SERRATIA MARCESCENS RARE ENTEROCOCCUS FAECALIS MIXED ANAEROBIC FLORA PRESENT.  CALL LAB IF FURTHER IID REQUIRED.    LABORGA SERRATIA MARCESCENS 01/01/2021   LABORGA ENTEROCOCCUS FAECALIS 01/01/2021     Lab Results  Component Value Date   ALBUMIN 2.0 (L) 12/16/2020   ALBUMIN 2.5 (L) 12/09/2020   ALBUMIN 3.0 (L) 12/08/2020    Lab Results  Component Value Date   MG 1.7 12/16/2020   MG 1.9 12/09/2020   MG 1.7 12/08/2020   No results found for: VD25OH  No results found for: PREALBUMIN CBC EXTENDED Latest Ref Rng & Units 01/22/2021 12/16/2020 12/15/2020  WBC 4.0 - 10.5 K/uL 9.7 7.9 9.2  RBC 4.22 - 5.81 MIL/uL 3.96(L) 2.83(L) 2.72(L)  HGB 13.0 - 17.0 g/dL  11.5(L) 8.8(L) 8.4(L)  HCT 39.0 - 52.0 % 36.7(L) 26.9(L) 25.7(L)  PLT 150 - 400 K/uL 283 242 204  NEUTROABS 1.7 - 7.7 K/uL - 5.4 -  LYMPHSABS 0.7 - 4.0 K/uL - 1.1 -     There is no height or weight on file to calculate BMI.  Orders:  No orders of the defined types were placed in this encounter.  Meds ordered this encounter  Medications  . gabapentin (NEURONTIN) 100 MG capsule    Sig: Take 1 capsule (100 mg total) by mouth 3 (three) times daily. Begin by taking one capsule only at bedtime for one week. Second week take with breakfast and bedtime for one week. Then tid    Dispense:  90 capsule    Refill:  0     Procedures: No procedures performed  Clinical Data: No additional findings.  ROS:  All other systems negative, except as  noted in the HPI. Review of Systems  Objective: Vital Signs: There were no vitals taken for this visit.  Specialty Comments:  No specialty comments available.  PMFS History: Patient Active Problem List   Diagnosis Date Noted  . 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 Peripheral vascular disease ([place]) 12/29/2020  . CHF (congestive heart failure), NYHA class II, chronic, diastolic ([place]) 32/11/2481  . History of COVID-19 12/22/2020  . SSS (sick sinus syndrome) ([place]) 12/22/2020  . Acute bacterial endocarditis   . MSSA bacteremia 12/09/2020  . Diabetic foot ulcer ([place]) 12/09/2020  . Foot drop, right foot   . Generalized weakness 12/08/2020  . Dehydration with hyponatremia 12/08/2020  . Atrial fibrillation, chronic ([place]) 12/08/2020  . Elevated troponin level not due myocardial infarction 12/08/2020  . Adrenal insufficiency ([place]) 11/14/2020  . Orthostatic hypotension 11/14/2020  . Physical deconditioning 11/14/2020  . Difficulty sleeping 11/07/2020  . Postoperative anemia due to acute blood loss 11/07/2020  . Right ventricular dysfunction 11/05/2020  . 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 Hearing loss 11/03/2020  . Cardiac device in situ 09/09/2020  . Coronary atherosclerosis due to lipid rich plaque 09/02/2020  . COVID-19 virus infection 07/18/2020  . Coronary artery disease involving native heart without angina pectoris 07/10/2020  . Chronic kidney disease, stage 3a ([place]) 03/29/2020  . 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 ([place]) 03/16/2020  . Right epiretinal membrane 03/16/2020  . Posterior vitreous detachment of right eye 03/16/2020  . Aortic stenosis, moderate 03/12/2020  . 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 Near syncope 04/27/2018  . Hypoglycemia due to insulin 01/06/2018   . Essential hypertension 01/06/2018  . Diabetes mellitus type 2, with complication, on long term insulin pump ([place]) 01/06/2018  . Syncope and collapse 01/05/2018  . Encounter for therapeutic drug monitoring 06/29/2017  . OSA on CPAP 05/05/2017  . 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[place]) 01/05/2017  . Abnormal chest x-ray 10/12/2016  . Benign neoplasm of colon 01/14/2013   Past Medical History:  Diagnosis Date  . 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 Cataract   . Chronic kidney disease    d/t DM  . CKD (chronic kidney disease), stage III ([place])   . Diabetes mellitus    Vgo disposal insulin bolus  simular to insulin pump  . Dyspnea   . GERD (gastroesophageal reflux disease)   . History of kidney stones  passed  . Hyperlipidemia   . Hypertension   . Idiopathic pulmonary fibrosis ([place]) 11/2016  . ILD (interstitial lung disease) ([place])   . Moderate aortic stenosis    a. 10/2019 Echo: EF 55-60%, Gr2 DD. Nl RV.   Marland Kitchen Neuromuscular disorder ([place])   . Neuropathy associated with endocrine disorder ([place])   . Nonobstructive CAD (coronary artery disease)    a. 2012 Cath: mod, nonobs dzs; b. 10/2016 MV: EF 60%, no ischemia.  . OSA on CPAP 05/05/2017   Unattended Home Sleep Test 7/2/813-AHI 38.6/hour, desaturation to 64%, body weight 261 pounds  . PONV (postoperative nausea and vomiting)   . Sleep apnea     uses cpap asked to bring mask and tubing    Family History  Problem Relation Age of Onset  . Diabetes Mellitus II Mother   . Emphysema Father 70  . Heart attack Father   . Colon cancer Neg Hx   . Esophageal cancer Neg Hx   . Rectal cancer Neg Hx   . Stomach cancer Neg Hx     Past Surgical History:  Procedure Laterality Date  . ABDOMINAL AORTOGRAM W/LOWER EXTREMITY N/A 12/10/2020   Procedure: ABDOMINAL AORTOGRAM W/LOWER EXTREMITY;  Surgeon: Cherre Robins, MD;  Location: [facility];  Service: Cardiovascular;  Laterality: N/A;  . AMPUTATION Right 01/22/2021   Procedure: RIGHT 5TH RAY  AMPUTATION;  Surgeon: [name], MD;  Location: [facility];  Service: Orthopedics;  Laterality: Right;  . ANKLE FUSION Right 01/22/2021   Procedure: RIGHT FOOT TIBIOCALCANEAL FUSION;  Surgeon: [name], MD;  Location: [facility];  Service: Orthopedics;  Laterality: Right;  . ANTERIOR FUSION CERVICAL SPINE  2012  . CARDIAC CATHETERIZATION  2011  . CARDIAC CATHETERIZATION N/A 11/09/2016   Procedure: Right Heart Cath;  Surgeon: Belva Crome, MD;  Location: [facility];  Service: Cardiovascular;  Laterality: N/A;  . carpel tunnel     left wrist  . CATARACT EXTRACTION    . CATARACT EXTRACTION W/ INTRAOCULAR LENS  IMPLANT, BILATERAL  2013  . CERVICAL LAMINECTOMY  2012  . COLONOSCOPY N/A 01/14/2013   Procedure: COLONOSCOPY;  Surgeon: Irene Shipper, MD;  Location: [facility];  Service: Endoscopy;  Laterality: N/A;  . CORONARY ARTERY BYPASS GRAFT  11/04/2020   LIMA-LAD, SVG-OM1, SVG-PDA (Dr Marney [facility]) dc 11/18/2020  . EYE SURGERY    . [place]   left  . LEFT HEART CATH AND CORONARY ANGIOGRAPHY N/A 07/10/2020   Procedure: LEFT HEART CATH AND CORONARY ANGIOGRAPHY;  Surgeon: Sherren Mocha, MD;  Location: [facility];  Service: Cardiovascular;  Laterality: N/A;  . LUMBAR LAMINECTOMY  2003  . LUNG BIOPSY Left 12/26/2016   Procedure: LUNG BIOPSY;  Surgeon: [name], MD;  Location: [facility];  Service: Thoracic;  Laterality: Left;  . PACEMAKER IMPLANT N/A 03/30/2020   Procedure: PACEMAKER IMPLANT;  Surgeon: Evans Lance, MD;  Location: [facility];  Service: Cardiovascular;  Laterality: N/A;  . PERIPHERAL VASCULAR INTERVENTION Right 12/10/2020   Procedure: PERIPHERAL VASCULAR INTERVENTION;  Surgeon: Cherre Robins, MD;  Location: [facility];  Service: Cardiovascular;  Laterality: Right;  SFA  . POSTERIOR FUSION CERVICAL SPINE  2012  . PPM GENERATOR REMOVAL N/A 12/14/2020   Procedure: PPM GENERATOR REMOVAL;  Surgeon: [place] Lance, MD;  Location: [facility];  Service: Cardiovascular;  Laterality: N/A;  . TEE WITHOUT CARDIOVERSION N/A 12/11/2020  Procedure: TRANSESOPHAGEAL ECHOCARDIOGRAM (TEE);  Surgeon: Geralynn Rile, MD;  Location: [facility];  Service: Cardiovascular;  Laterality: N/A;  . TRIGGER FINGER RELEASE  2011   4th finger left hand  . VIDEO ASSISTED THORACOSCOPY Left 12/26/2016   Procedure: VIDEO ASSISTED THORACOSCOPY;  Surgeon: [name], MD;  Location: [facility];  Service: Thoracic;  Laterality: Left;  Marland Kitchen VIDEO BRONCHOSCOPY N/A 12/26/2016   Procedure: VIDEO BRONCHOSCOPY;  Surgeon: [name], MD;  Location: [facility];  Service: Thoracic;  Laterality: N/A;   Social History   Occupational History  . Not on file  Tobacco Use  . Smoking status: Never Smoker  . Smokeless tobacco: Never Used  Vaping Use  . Vaping Use: Never used  Substance and Sexual Activity  . Alcohol use: No  . Drug use: No  . Sexual activity: Not Currently

## 2021-02-15 ENCOUNTER — Ambulatory Visit (HOSPITAL_COMMUNITY)
Admission: RE | Admit: 2021-02-15 | Discharge: 2021-02-15 | Disposition: A | Discharge status: Home / Self Care | Payer: Medicare Other | Source: Ambulatory Visit | Attending: Internal Medicine | Admitting: Internal Medicine

## 2021-02-15 ENCOUNTER — Inpatient Hospital Stay (HOSPITAL_COMMUNITY)
Admission: AD | Admit: 2021-02-15 | Discharge: 2021-02-22 | DRG: 493 | Disposition: A | Discharge status: Home Health | Payer: Medicare Other | Attending: Orthopedic Surgery | Admitting: Orthopedic Surgery

## 2021-02-15 ENCOUNTER — Other Ambulatory Visit: Payer: Self-pay

## 2021-02-15 ENCOUNTER — Ambulatory Visit (INDEPENDENT_AMBULATORY_CARE_PROVIDER_SITE_OTHER): Payer: Medicare Other | Admitting: Orthopedic Surgery

## 2021-02-15 ENCOUNTER — Encounter (HOSPITAL_COMMUNITY): Payer: Self-pay | Admitting: Orthopedic Surgery

## 2021-02-15 ENCOUNTER — Encounter: Payer: Self-pay | Admitting: Orthopedic Surgery

## 2021-02-15 ENCOUNTER — Other Ambulatory Visit: Payer: Self-pay | Admitting: Physician Assistant

## 2021-02-15 VITALS — Ht 72.0 in | Wt 241.0 lb

## 2021-02-15 DIAGNOSIS — Z951 Presence of aortocoronary bypass graft: Secondary | ICD-10-CM

## 2021-02-15 DIAGNOSIS — K219 Gastro-esophageal reflux disease without esophagitis: Secondary | ICD-10-CM | POA: Diagnosis present

## 2021-02-15 DIAGNOSIS — L02611 Cutaneous abscess of right foot: Secondary | ICD-10-CM | POA: Diagnosis present

## 2021-02-15 DIAGNOSIS — T84622A Infection and inflammatory reaction due to internal fixation device of right tibia, initial encounter: Secondary | ICD-10-CM | POA: Diagnosis present

## 2021-02-15 DIAGNOSIS — I130 Hypertensive heart and chronic kidney disease with heart failure and stage 1 through stage 4 chronic kidney disease, or unspecified chronic kidney disease: Secondary | ICD-10-CM | POA: Diagnosis present

## 2021-02-15 DIAGNOSIS — I482 Chronic atrial fibrillation, unspecified: Secondary | ICD-10-CM | POA: Diagnosis present

## 2021-02-15 DIAGNOSIS — Z7901 Long term (current) use of anticoagulants: Secondary | ICD-10-CM | POA: Diagnosis not present

## 2021-02-15 DIAGNOSIS — E113593 Type 2 diabetes mellitus with proliferative diabetic retinopathy without macular edema, bilateral: Secondary | ICD-10-CM | POA: Diagnosis present

## 2021-02-15 DIAGNOSIS — I10 Essential (primary) hypertension: Secondary | ICD-10-CM | POA: Diagnosis present

## 2021-02-15 DIAGNOSIS — G4733 Obstructive sleep apnea (adult) (pediatric): Secondary | ICD-10-CM

## 2021-02-15 DIAGNOSIS — E785 Hyperlipidemia, unspecified: Secondary | ICD-10-CM | POA: Diagnosis present

## 2021-02-15 DIAGNOSIS — Y848 Other medical procedures as the cause of abnormal reaction of the patient, or of later complication, without mention of misadventure at the time of the procedure: Secondary | ICD-10-CM | POA: Diagnosis present

## 2021-02-15 DIAGNOSIS — E1151 Type 2 diabetes mellitus with diabetic peripheral angiopathy without gangrene: Secondary | ICD-10-CM | POA: Diagnosis present

## 2021-02-15 DIAGNOSIS — E875 Hyperkalemia: Secondary | ICD-10-CM | POA: Diagnosis present

## 2021-02-15 DIAGNOSIS — Z9989 Dependence on other enabling machines and devices: Secondary | ICD-10-CM | POA: Diagnosis not present

## 2021-02-15 DIAGNOSIS — E1142 Type 2 diabetes mellitus with diabetic polyneuropathy: Secondary | ICD-10-CM | POA: Diagnosis present

## 2021-02-15 DIAGNOSIS — L02415 Cutaneous abscess of right lower limb: Secondary | ICD-10-CM | POA: Diagnosis present

## 2021-02-15 DIAGNOSIS — Z89421 Acquired absence of other right toe(s): Secondary | ICD-10-CM | POA: Diagnosis not present

## 2021-02-15 DIAGNOSIS — Z794 Long term (current) use of insulin: Secondary | ICD-10-CM | POA: Diagnosis not present

## 2021-02-15 DIAGNOSIS — T847XXD Infection and inflammatory reaction due to other internal orthopedic prosthetic devices, implants and grafts, subsequent encounter: Secondary | ICD-10-CM

## 2021-02-15 DIAGNOSIS — E871 Hypo-osmolality and hyponatremia: Secondary | ICD-10-CM | POA: Diagnosis present

## 2021-02-15 DIAGNOSIS — Z006 Encounter for examination for normal comparison and control in clinical research program: Secondary | ICD-10-CM | POA: Insufficient documentation

## 2021-02-15 DIAGNOSIS — D631 Anemia in chronic kidney disease: Secondary | ICD-10-CM | POA: Diagnosis present

## 2021-02-15 DIAGNOSIS — E669 Obesity, unspecified: Secondary | ICD-10-CM | POA: Diagnosis present

## 2021-02-15 DIAGNOSIS — L03115 Cellulitis of right lower limb: Secondary | ICD-10-CM | POA: Diagnosis present

## 2021-02-15 DIAGNOSIS — N1831 Chronic kidney disease, stage 3a: Secondary | ICD-10-CM | POA: Diagnosis present

## 2021-02-15 DIAGNOSIS — I2510 Atherosclerotic heart disease of native coronary artery without angina pectoris: Secondary | ICD-10-CM | POA: Diagnosis present

## 2021-02-15 DIAGNOSIS — T847XXS Infection and inflammatory reaction due to other internal orthopedic prosthetic devices, implants and grafts, sequela: Secondary | ICD-10-CM | POA: Diagnosis not present

## 2021-02-15 DIAGNOSIS — I5032 Chronic diastolic (congestive) heart failure: Secondary | ICD-10-CM | POA: Diagnosis present

## 2021-02-15 DIAGNOSIS — Z20822 Contact with and (suspected) exposure to covid-19: Secondary | ICD-10-CM | POA: Diagnosis present

## 2021-02-15 DIAGNOSIS — M71071 Abscess of bursa, right ankle and foot: Secondary | ICD-10-CM | POA: Diagnosis not present

## 2021-02-15 DIAGNOSIS — Z7982 Long term (current) use of aspirin: Secondary | ICD-10-CM

## 2021-02-15 DIAGNOSIS — Y792 Prosthetic and other implants, materials and accessory orthopedic devices associated with adverse incidents: Secondary | ICD-10-CM | POA: Diagnosis present

## 2021-02-15 DIAGNOSIS — E1122 Type 2 diabetes mellitus with diabetic chronic kidney disease: Secondary | ICD-10-CM | POA: Diagnosis present

## 2021-02-15 DIAGNOSIS — Z6832 Body mass index (BMI) 32.0-32.9, adult: Secondary | ICD-10-CM

## 2021-02-15 DIAGNOSIS — T847XXA Infection and inflammatory reaction due to other internal orthopedic prosthetic devices, implants and grafts, initial encounter: Secondary | ICD-10-CM | POA: Diagnosis not present

## 2021-02-15 DIAGNOSIS — Z79899 Other long term (current) drug therapy: Secondary | ICD-10-CM

## 2021-02-15 DIAGNOSIS — E113591 Type 2 diabetes mellitus with proliferative diabetic retinopathy without macular edema, right eye: Secondary | ICD-10-CM | POA: Diagnosis present

## 2021-02-15 DIAGNOSIS — J84112 Idiopathic pulmonary fibrosis: Secondary | ICD-10-CM | POA: Diagnosis present

## 2021-02-15 DIAGNOSIS — N179 Acute kidney failure, unspecified: Secondary | ICD-10-CM | POA: Diagnosis present

## 2021-02-15 DIAGNOSIS — N189 Chronic kidney disease, unspecified: Secondary | ICD-10-CM | POA: Diagnosis not present

## 2021-02-15 DIAGNOSIS — Z950 Presence of cardiac pacemaker: Secondary | ICD-10-CM

## 2021-02-15 DIAGNOSIS — E1165 Type 2 diabetes mellitus with hyperglycemia: Secondary | ICD-10-CM | POA: Diagnosis present

## 2021-02-15 DIAGNOSIS — I5022 Chronic systolic (congestive) heart failure: Secondary | ICD-10-CM | POA: Diagnosis present

## 2021-02-15 LAB — COMPREHENSIVE METABOLIC PANEL
ALT: 13 U/L (ref 0–44)
AST: 21 U/L (ref 15–41)
Albumin: 2.6 g/dL — ABNORMAL LOW (ref 3.5–5.0)
Alkaline Phosphatase: 85 U/L (ref 38–126)
Anion gap: 13 (ref 5–15)
BUN: 64 mg/dL — ABNORMAL HIGH (ref 8–23)
CO2: 23 mmol/L (ref 22–32)
Calcium: 8.9 mg/dL (ref 8.9–10.3)
Chloride: 90 mmol/L — ABNORMAL LOW (ref 98–111)
Creatinine, Ser: 2.66 mg/dL — ABNORMAL HIGH (ref 0.61–1.24)
GFR, Estimated: 24 mL/min — ABNORMAL LOW (ref >60)
Glucose, Bld: 191 mg/dL — ABNORMAL HIGH (ref 70–99)
Potassium: 4.8 mmol/L (ref 3.5–5.1)
Sodium: 126 mmol/L — ABNORMAL LOW (ref 135–145)
Total Bilirubin: 1.1 mg/dL (ref 0.3–1.2)
Total Protein: 8.1 g/dL (ref 6.5–8.1)

## 2021-02-15 LAB — HEMOGLOBIN A1C
Hgb A1c MFr Bld: 7.8 % — ABNORMAL HIGH (ref 4.8–5.6)
Mean Plasma Glucose: 177.16 mg/dL

## 2021-02-15 LAB — CBC
HCT: 26 % — ABNORMAL LOW (ref 39.0–52.0)
Hemoglobin: 8.4 g/dL — ABNORMAL LOW (ref 13.0–17.0)
MCH: 28.8 pg (ref 26.0–34.0)
MCHC: 32.3 g/dL (ref 30.0–36.0)
MCV: 89 fL (ref 80.0–100.0)
Platelets: 318 10*3/uL (ref 150–400)
RBC: 2.92 MIL/uL — ABNORMAL LOW (ref 4.22–5.81)
RDW: 14.7 % (ref 11.5–15.5)
WBC: 10.6 10*3/uL — ABNORMAL HIGH (ref 4.0–10.5)
nRBC: 0 % (ref 0.0–0.2)

## 2021-02-15 LAB — PHOSPHORUS: Phosphorus: 5.7 mg/dL — ABNORMAL HIGH (ref 2.5–4.6)

## 2021-02-15 LAB — MAGNESIUM: Magnesium: 2.2 mg/dL (ref 1.7–2.4)

## 2021-02-15 LAB — GLUCOSE, CAPILLARY
Glucose-Capillary: 186 mg/dL — ABNORMAL HIGH (ref 70–99)
Glucose-Capillary: 162 mg/dL — ABNORMAL HIGH (ref 70–99)

## 2021-02-15 MED ORDER — SODIUM CHLORIDE 0.9 % IV SOLN: INTRAVENOUS | Status: DC

## 2021-02-15 MED ORDER — PENTOXIFYLLINE ER 400 MG PO TBCR
400.0000 mg | EXTENDED_RELEASE_TABLET | Freq: Three times a day (TID) | ORAL | Status: DC
  Administered 2021-02-16 – 2021-02-22 (×17): 400 mg via ORAL
  Filled 2021-02-15 (×23): qty 1

## 2021-02-15 MED ORDER — DAPAGLIFLOZIN PROPANEDIOL 10 MG PO TABS
10.0000 mg | ORAL_TABLET | Freq: Every day | ORAL | Status: DC
  Administered 2021-02-16 – 2021-02-17 (×2): 10 mg via ORAL
  Filled 2021-02-15 (×3): qty 1

## 2021-02-15 MED ORDER — MAGNESIUM OXIDE 400 (241.3 MG) MG PO TABS
400.0000 mg | ORAL_TABLET | Freq: Every day | ORAL | Status: DC
Start: 2021-02-15 — End: 2021-02-22
  Administered 2021-02-16 – 2021-02-22 (×6): 400 mg via ORAL
  Filled 2021-02-15 (×9): qty 1

## 2021-02-15 MED ORDER — ONDANSETRON HCL 4 MG PO TABS
4.0000 mg | ORAL_TABLET | Freq: Three times a day (TID) | ORAL | Status: DC | PRN
  Administered 2021-02-15 – 2021-02-21 (×9): 4 mg via ORAL
  Filled 2021-02-15 (×9): qty 1

## 2021-02-15 MED ORDER — LORAZEPAM 1 MG PO TABS: 1.0000 mg | ORAL_TABLET | Freq: Every evening | ORAL | Status: DC | PRN

## 2021-02-15 MED ORDER — AZITHROMYCIN 500 MG PO TABS
500.0000 mg | ORAL_TABLET | Freq: Every day | ORAL | Status: AC
  Administered 2021-02-15 – 2021-02-17 (×3): 500 mg via ORAL
  Filled 2021-02-15 (×4): qty 1

## 2021-02-15 MED ORDER — PANTOPRAZOLE SODIUM 40 MG PO TBEC
40.0000 mg | DELAYED_RELEASE_TABLET | Freq: Every day | ORAL | Status: DC
  Administered 2021-02-16 – 2021-02-22 (×6): 40 mg via ORAL
  Filled 2021-02-15 (×7): qty 1

## 2021-02-15 MED ORDER — ALBUTEROL SULFATE HFA 108 (90 BASE) MCG/ACT IN AERS
2.0000 | INHALATION_SPRAY | RESPIRATORY_TRACT | Status: DC | PRN
  Filled 2021-02-15: qty 6.7

## 2021-02-15 MED ORDER — FREESTYLE LIBRE 14 DAY SENSOR MISC: 1.0000 | Status: DC

## 2021-02-15 MED ORDER — HYDROMORPHONE HCL 1 MG/ML IJ SOLN
0.5000 mg | INTRAMUSCULAR | Status: DC | PRN
  Administered 2021-02-16 – 2021-02-20 (×4): 0.5 mg via INTRAVENOUS
  Filled 2021-02-15 (×5): qty 0.5

## 2021-02-15 MED ORDER — LOSARTAN POTASSIUM 50 MG PO TABS
50.0000 mg | ORAL_TABLET | Freq: Every day | ORAL | Status: DC
  Administered 2021-02-16: 50 mg via ORAL
  Filled 2021-02-15 (×2): qty 1

## 2021-02-15 MED ORDER — ENSURE MAX PROTEIN PO LIQD
11.0000 [oz_av] | Freq: Two times a day (BID) | ORAL | Status: DC
  Administered 2021-02-15 – 2021-02-16 (×2): 11 [oz_av] via ORAL

## 2021-02-15 MED ORDER — FLUTICASONE PROPIONATE 50 MCG/ACT NA SUSP
2.0000 | Freq: Every day | NASAL | Status: DC
  Administered 2021-02-15 – 2021-02-22 (×6): 2 via NASAL
  Filled 2021-02-15: qty 16

## 2021-02-15 MED ORDER — FUROSEMIDE 40 MG PO TABS
40.0000 mg | ORAL_TABLET | Freq: Every day | ORAL | Status: DC
  Administered 2021-02-16: 40 mg via ORAL
  Filled 2021-02-15 (×2): qty 1

## 2021-02-15 MED ORDER — POLYETHYLENE GLYCOL 3350 17 GM/SCOOP PO POWD
17.0000 g | Freq: Every day | ORAL | Status: DC | PRN
  Filled 2021-02-15 (×2): qty 255

## 2021-02-15 MED ORDER — INSULIN ASPART 100 UNIT/ML ~~LOC~~ SOLN
10.0000 [IU] | Freq: Three times a day (TID) | SUBCUTANEOUS | Status: DC
  Administered 2021-02-16 – 2021-02-22 (×20): 10 [IU] via SUBCUTANEOUS

## 2021-02-15 MED ORDER — ATORVASTATIN CALCIUM 10 MG PO TABS
20.0000 mg | ORAL_TABLET | Freq: Two times a day (BID) | ORAL | Status: DC
  Administered 2021-02-15 – 2021-02-22 (×13): 20 mg via ORAL
  Filled 2021-02-15 (×13): qty 2

## 2021-02-15 MED ORDER — METOPROLOL TARTRATE 25 MG PO TABS
25.0000 mg | ORAL_TABLET | Freq: Two times a day (BID) | ORAL | Status: DC
  Administered 2021-02-15 – 2021-02-16 (×2): 25 mg via ORAL
  Filled 2021-02-15 (×2): qty 1

## 2021-02-15 MED ORDER — INSULIN ASPART 100 UNIT/ML ~~LOC~~ SOLN
0.0000 [IU] | Freq: Three times a day (TID) | SUBCUTANEOUS | Status: DC
  Administered 2021-02-16 – 2021-02-17 (×3): 2 [IU] via SUBCUTANEOUS
  Administered 2021-02-18 (×2): 1 [IU] via SUBCUTANEOUS
  Administered 2021-02-19 (×2): 2 [IU] via SUBCUTANEOUS
  Administered 2021-02-20: 5 [IU] via SUBCUTANEOUS
  Administered 2021-02-20 – 2021-02-21 (×4): 3 [IU] via SUBCUTANEOUS
  Administered 2021-02-21 – 2021-02-22 (×4): 2 [IU] via SUBCUTANEOUS

## 2021-02-15 MED ORDER — NITROGLYCERIN 0.2 MG/HR TD PT24
0.2000 mg | MEDICATED_PATCH | Freq: Every day | TRANSDERMAL | Status: DC
  Administered 2021-02-16 – 2021-02-18 (×3): 0.2 mg via TRANSDERMAL
  Filled 2021-02-15 (×8): qty 1

## 2021-02-15 MED ORDER — ASPIRIN EC 81 MG PO TBEC
81.0000 mg | DELAYED_RELEASE_TABLET | Freq: Every day | ORAL | Status: DC
  Administered 2021-02-16 – 2021-02-22 (×6): 81 mg via ORAL
  Filled 2021-02-15 (×7): qty 1

## 2021-02-15 MED ORDER — STUDY - FIBROGEN - PAMREVLUMAB 10 MG/ML IV INFUSION (OPEN LABEL) (PI-RAMASWAMY)
30.0000 mg/kg | Freq: Once | INTRAVENOUS | Status: AC
  Administered 2021-02-15: 3650 mg via INTRAVENOUS
  Filled 2021-02-15: qty 365

## 2021-02-15 MED ORDER — POTASSIUM CHLORIDE CRYS ER 20 MEQ PO TBCR
20.0000 meq | EXTENDED_RELEASE_TABLET | Freq: Every day | ORAL | Status: DC
  Administered 2021-02-16: 20 meq via ORAL
  Filled 2021-02-15: qty 1

## 2021-02-15 MED ORDER — ACETAMINOPHEN 325 MG PO TABS
325.0000 mg | ORAL_TABLET | Freq: Four times a day (QID) | ORAL | Status: DC | PRN
  Administered 2021-02-15 – 2021-02-22 (×9): 650 mg via ORAL
  Filled 2021-02-15 (×10): qty 2

## 2021-02-15 MED ORDER — APIXABAN 5 MG PO TABS
5.0000 mg | ORAL_TABLET | Freq: Two times a day (BID) | ORAL | Status: DC
  Administered 2021-02-15 – 2021-02-22 (×13): 5 mg via ORAL
  Filled 2021-02-15 (×13): qty 1

## 2021-02-15 MED ORDER — OXYCODONE HCL 5 MG PO TABS
5.0000 mg | ORAL_TABLET | ORAL | Status: DC | PRN
  Administered 2021-02-15 – 2021-02-22 (×23): 10 mg via ORAL
  Filled 2021-02-15 (×25): qty 2

## 2021-02-15 MED ORDER — GABAPENTIN 100 MG PO CAPS
100.0000 mg | ORAL_CAPSULE | Freq: Three times a day (TID) | ORAL | Status: DC
  Administered 2021-02-15 – 2021-02-22 (×19): 100 mg via ORAL
  Filled 2021-02-15 (×20): qty 1

## 2021-02-15 MED ORDER — BUDESONIDE 0.25 MG/2ML IN SUSP
0.2500 mg | Freq: Two times a day (BID) | RESPIRATORY_TRACT | Status: DC
  Administered 2021-02-16 – 2021-02-22 (×10): 0.25 mg via RESPIRATORY_TRACT
  Filled 2021-02-15 (×17): qty 2

## 2021-02-15 NOTE — Research (Signed)
Title: [id number] Open-Label Extension (OLE) is a multi-center, single-arm, open-label extension (OLE) phase where subjects who complete the Week 48 visit of the main study are eligible to participate. The extension to the main study allows for the continued evaluation of the efficacy and safety of 30 mg/kg IV infusions of pamrevlumab administered every 3 weeks for 52 weeks in subjects with Idiopathic Pulmonary Fibrosis.  Primary endpoint is: To provide continued access to pamrevlumab in subjects with IPF who completed the Week 48 visit of the main study  Protocol #: [id number], Clinical Trials #: [id number] Sponsor: [email or web address]  ([place], [place], [place])  Scientist, physiological / Electrical engineer note : This visit for Subject Shane [name] with DOB: 06/17/1941 on 02/15/2021 for the above protocol is Visit/Encounter #EX Week 66 and is for purpose of research.   The consent for this encounter is under Protocol Version Amendment 5.0 (Dated 26AUG2021) , Investigator Brochure Edition version 19 dated 09 Oct 2020 Consent Version 4.0, revised 21JUL2021and is currently IRB approved.   Subject expressed continued interest and consent in continuing as a study subject. Subject confirmed that there was no change in contact information (e.g. address, telephone, email). Subject thanked for participation in research and contribution to science.  In this visit 02/15/2021 the subject is not required to be evaluated by an investigator.   During this visit on 02/15/2021, all procedures were completed per the above mentioned protocol. Study drug was infused without problems. Please refer to the subjects paper source binder for further documentation of the visit. The subject will return in approximately 3 weeks for his next infusion.   Signed by Gwendolyn Grant Research Assistant [employer]  [place], [place] 12:39 PM 02/15/2021

## 2021-02-15 NOTE — Consult Note (Addendum)
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  Recommend vancomycin and Zosyn as per orthopedics.  Patient with previous history of  Ray amputation and ankle fusion.  Complains of moderately severe pain.  On Dilaudid.  States that he could not tolerate Vicodin as well.  Cough.  States that it has bothered him little more recently.  Has some sinus tenderness.  Will empirically try Flonase spray, Protonix for now.  Mild rhonchi noted.  Continue albuterol inhaler.  Could be mild bronchitis as well.  We will add empiric Zithromax for now.  Add budesonide nebulizers as well.  Diabetes mellitus type 2 on insulin.  With peripheral neuropathy, retinopathy.  Sliding-scale insulin, exercise, diabetic diet.  On gabapentin 3 times daily.  We will continue that.  On Farxiga as well  Chronic diastolic congestive heart failure.  Status post CABG in the past.  No chest pain.  Continue taking output charting, daily weights.  No acute decompensation at this time.  We will add fluid restriction 1500 mils per day.  2 g sodium diet.  Continue losartan, metoprolol, aspirin, Lipitor  Essential hypertension.  Continue losartan and metoprolol.  Closely monitor blood pressure.  History  of chronic atrial fibrillation. Rate controlled at this time.  On Eliquis and metoprolol.  We will continue with that.  History of idiopathic pulmonary fibrosis on immunotherapy, follows up with pulmonary as outpatient.  Patient gets 3 monthly infusions with Dr. Chase Caller.  Recently had infusion done.  Patient states that his fibrosis is under control.  He is not on supplemental oxygen.  Sleep apnea on CPAP at home.  Resume CPAP at the hospital.  Anemia of chronic kidney disease. Monitor CBC.  Obesity.  Would benefit from weight loss as outpatient.  Chronic kidney disease stage IIIa. Baseline creatinine around 1.2-1.5.  Obtain baseline labs today.  Peripheral vascular disease.  On pentoxifylline.  Continue.  Generalized weakness and deconditioning.  Will get PT evaluation once okay with orthopedics.  [place] team will followup again tomorrow. Please contact me if I can be of assistance in the meanwhile. Thank you for this consultation.  Chief Complaint: Right foot swelling  HPI:  Patient is a 80 years old male with past medical history of idiopathic pulmonary fibrosis who was on immunotherapy, hypertension, sleep apnea, diabetes mellitus type 2, chronic kidney disease stage IIIa, chronic atrial fibrillation, status post pacemaker congestive heart failure diastolic status post CABG, peripheral vascular disease status post right lower extremity endovascular revascularization in the past, fifth right toe ray amputation and ankle fusion in the past who was admitted by orthopedic service for cellulitis of the right foot.,  Coughing and increasing shortness of breath.  He was noted to have an elevated blood glucose level and has not been eating well at home.  Patient states that his cough  is little more than usual dry some clear/greenish phlegm production.  No fever chills or rigor at home.  He does have some sinus congestion as well.  Denies heartburn or history of reflux disease on Protonix.   Patient complains of right foot pain swelling and redness.  Denies any nausea vomiting or abdominal pain.  Has had regular bowel movements.  Denies any urinary urgency, frequency or dysuria.  Denies sick contacts or recent travel.  Denies dizziness lightheadedness but has a generalized weakness recently for the last 2 to 3 weeks.  He feels a little more short winded for 2 to 3 weeks as well.   Review of Systems:  All systems were reviewed and were negative except as in the history of presenting illness  Past Medical History:  Diagnosis Date  . 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 Cataract   . Chronic kidney disease    d/t DM  . CKD (chronic kidney disease), stage III ([place])   . Diabetes mellitus    Vgo disposal insulin bolus  simular to insulin pump  . Dyspnea   . GERD (gastroesophageal reflux disease)   . History of kidney stones    passed  . Hyperlipidemia   . Hypertension   . Idiopathic pulmonary fibrosis ([place]) 11/2016  . ILD (interstitial lung disease) ([place])   . Moderate aortic stenosis    a. 10/2019 Echo: EF 55-60%, Gr2 DD. Nl RV.   Marland Kitchen Neuromuscular disorder ([place])   . Neuropathy associated with endocrine disorder ([place])   . Nonobstructive CAD (coronary artery disease)    a. 2012 Cath: mod, nonobs dzs; b. 10/2016 MV: EF 60%, no ischemia.  . OSA on CPAP 05/05/2017   Unattended Home Sleep Test 7/2/813-AHI 38.6/hour, desaturation to 64%, body weight 261 pounds  . PONV (postoperative nausea and vomiting)   . Sleep apnea     uses cpap asked to bring mask and tubing   Past Surgical History:  Procedure Laterality Date  . ABDOMINAL AORTOGRAM W/LOWER EXTREMITY N/A 12/10/2020   Procedure: ABDOMINAL AORTOGRAM W/LOWER EXTREMITY;  Surgeon: Cherre Robins, MD;  Location: [facility];  Service: Cardiovascular;  Laterality: N/A;  . AMPUTATION Right 01/22/2021   Procedure: 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;  Surgeon: [name], MD;  Location: [facility];  Service: Orthopedics;  Laterality: Right;  . ANKLE  FUSION Right 01/22/2021   Procedure: RIGHT FOOT TIBIOCALCANEAL FUSION;  Surgeon: [name], MD;  Location: [facility];  Service: Orthopedics;  Laterality: Right;  . ANTERIOR FUSION CERVICAL SPINE  2012  . CARDIAC CATHETERIZATION  2011  . CARDIAC CATHETERIZATION N/A 11/09/2016   Procedure: Right Heart Cath;  Surgeon: Belva Crome, MD;  Location: [facility];  Service: Cardiovascular;  Laterality: N/A;  . carpel tunnel     left wrist  . CATARACT EXTRACTION    . CATARACT EXTRACTION W/ INTRAOCULAR LENS  IMPLANT, BILATERAL  2013  . CERVICAL LAMINECTOMY  2012  . COLONOSCOPY N/A 01/14/2013   Procedure: COLONOSCOPY;  Surgeon: Irene Shipper, MD;  Location: [facility];  Service: Endoscopy;  Laterality: N/A;  . CORONARY ARTERY BYPASS GRAFT  11/04/2020   LIMA-LAD, SVG-OM1, SVG-PDA (Dr Marney [facility]) dc 11/18/2020  . EYE SURGERY    . [place]   left  . LEFT HEART CATH AND CORONARY ANGIOGRAPHY N/A 07/10/2020   Procedure: LEFT HEART CATH AND CORONARY ANGIOGRAPHY;  Surgeon: Sherren Mocha, MD;  Location: [place] [facility];  Service: Cardiovascular;  Laterality: N/A;  . LUMBAR LAMINECTOMY  2003  . LUNG BIOPSY Left 12/26/2016   Procedure: LUNG BIOPSY;  Surgeon: [name], MD;  Location: [place];  Service: Thoracic;  Laterality: Left;  . PACEMAKER IMPLANT N/A 03/30/2020   Procedure: PACEMAKER IMPLANT;  Surgeon: Evans Lance, MD;  Location: [facility] [facility];  Service: Cardiovascular;  Laterality: N/A;  . PERIPHERAL VASCULAR INTERVENTION Right 12/10/2020   Procedure: PERIPHERAL VASCULAR INTERVENTION;  Surgeon: Cherre Robins, MD;  Location: [facility];  Service: Cardiovascular;  Laterality: Right;  SFA  . POSTERIOR FUSION CERVICAL SPINE  2012  . PPM GENERATOR REMOVAL N/A 12/14/2020   Procedure: PPM GENERATOR REMOVAL;  Surgeon: Evans Lance, MD;  Location: [facility];  Service: Cardiovascular;  Laterality: N/A;  . TEE WITHOUT CARDIOVERSION N/A 12/11/2020    Procedure: TRANSESOPHAGEAL ECHOCARDIOGRAM (TEE);  Surgeon: Geralynn Rile, MD;  Location: [facility];  Service: Cardiovascular;  Laterality: N/A;  . TRIGGER FINGER RELEASE  2011   4th finger left hand  . VIDEO ASSISTED THORACOSCOPY Left 12/26/2016   Procedure: VIDEO ASSISTED THORACOSCOPY;  Surgeon: [name], MD;  Location: [facility];  Service: Thoracic;  Laterality: Left;  Marland Kitchen VIDEO BRONCHOSCOPY N/A 12/26/2016   Procedure: VIDEO BRONCHOSCOPY;  Surgeon: [name], MD;  Location: [facility];  Service: Thoracic;  Laterality: N/A;   Social History:  reports that he has never smoked. He has never used smokeless tobacco. He reports that he does not drink alcohol and does not use drugs.  Allergies  Allergen Reactions  . Codeine Hives and Itching  . Ofev [Nintedanib] Diarrhea    SEVERE DIARRHEA  . Other Diarrhea and Other (See Comments)    Esbriet causes elevated LFTs. D/C on 06/14/17 and SEVERE DIARRHEA   Family History  Problem Relation Age of Onset  . Diabetes Mellitus II Mother   . Emphysema Father 15  . Heart attack Father   . Colon cancer Neg Hx   . Esophageal cancer Neg Hx   . Rectal cancer Neg Hx   . Stomach cancer Neg Hx     Prior to Admission medications   Medication Sig Start Date End Date Taking? Authorizing Provider  albuterol (VENTOLIN HFA) 108 (90 Base) MCG/ACT inhaler Inhale 2 puffs into the lungs every 4 (four) hours as needed for wheezing or shortness of breath.    [provider]  apixaban (ELIQUIS) 5 MG TABS tablet Take 1 tablet (5 mg total) by mouth 2 (two) times daily. 12/08/20   Evans Lance, MD  Ascorbic Acid (VITAMIN C WITH ROSE HIPS) 500 MG tablet Take 500 mg by mouth daily.    [provider]  aspirin EC 81 MG tablet Take 81 mg by mouth daily.    [provider]  atorvastatin (LIPITOR) 40 MG tablet Take 20 mg by mouth in the morning and at bedtime. 11/16/20   [provider]  Continuous Blood Gluc Sensor  (FREESTYLE LIBRE 14 DAY SENSOR) MISC 1 patch every 14 (fourteen) days.    [provider]  Ensure Max Protein (ENSURE MAX PROTEIN) LIQD Take 330 mLs (11 oz total) by mouth 2 (two) times daily. 12/16/20   Raiford Noble Latif, DO  EPINEPHrine 0.3 mg/0.3 mL IJ SOAJ injection Inject 0.3 mg into the muscle once as needed for anaphylaxis (max 3 doses). 01/30/18   [provider]  FARXIGA 10 MG TABS tablet Take 10 mg by mouth daily. 01/14/21   [provider]  furosemide (LASIX) 40 MG tablet Take 40 mg by mouth daily. 02/15/18   [provider]  gabapentin (NEURONTIN) 100 MG capsule Take 1 capsule (100 mg total) by mouth 3 (three) times daily. Begin by taking one capsule only at bedtime for one week. Second week take with breakfast and bedtime for one week. Then tid 02/12/21   Suzan Slick, NP  HYDROcodone-acetaminophen (NORCO/VICODIN) 5-325 MG tablet Take 1 tablet by mouth every 4 (four) hours as needed. 01/22/21   [name], MD  insulin lispro (HUMALOG) 100 UNIT/ML injection Inject 0.07 mLs (7 Units total) into the skin 3 (three) times daily with meals. Patient taking differently: Inject 10 Units into the skin 3 (three) times daily with meals. 12/16/20   Sheikh, Georgina Quint Latif, DO  LORazepam (ATIVAN) 1 MG tablet Take 1 mg by mouth at bedtime as needed for sleep.  03/20/20   [provider]  losartan (COZAAR) 50 MG tablet Take 50 mg by mouth daily.    [provider]  magnesium oxide (MAG-OX) 400 MG tablet TAKE 1 CAPSULE BY MOUTH ONCE DAILY Patient taking differently: Take 400 mg by mouth daily. 08/11/20   Brand Males, MD  metoprolol tartrate (LOPRESSOR) 50 MG tablet Take 25 mg by mouth 2 (two) times daily. 11/16/20   [provider]  Multiple Vitamin (MULTIVITAMIN WITH MINERALS) TABS tablet Take 1 tablet by mouth daily.    [provider]  ondansetron (ZOFRAN) 4 MG tablet Take 1 tablet (4 mg total) by mouth every 8 (eight) hours as  needed for nausea or vomiting. 01/22/21   [name], MD  pantoprazole (PROTONIX) 40 MG tablet Take 40 mg by mouth daily.    [provider]  polyethylene glycol powder (GLYCOLAX/MIRALAX) 17 GM/SCOOP powder Take 17 g by mouth daily as needed for moderate constipation. 11/15/20   [provider]  potassium chloride SA (KLOR-CON) 20 MEQ tablet Take 20 mEq by mouth daily.    [provider]    Physical Exam: Blood pressure (!) 109/42, pulse (!) 52, temperature 99.1 F (37.3 C), temperature source Oral, resp. rate 17, SpO2 98 %. Vitals:   02/15/21 1607  BP: (!) 109/42  Pulse: (!) 52  Resp: 17  Temp: 99.1 F (37.3 C)  SpO2: 98%   There is no height or weight on file to calculate BMI.  No intake or output data in the 24 hours ending 02/15/21 1723   General: Obese built, not in obvious distress, alert awake and communicative HENT: Normocephalic, pupils equally reacting to light and accommodation.  No scleral pallor or icterus noted. Oral mucosa is moist.  Chest: Crackles noted bilaterally from previous IPF.  Mild rhonchi noted as well.  Previous CABG scar. CVS: S1 &S2 heard. No murmur.  Regular rate and rhythm. Abdomen: Soft, nontender, nondistended.  Bowel sounds are heard.  Liver is not palpable, no abdominal mass palpated Extremities: No cyanosis, clubbing or edema.  Right foot edema noted with tenderness on palpation. Firmly wrapped with elastic compression bandage. Psych: Alert, awake and oriented, normal mood CNS:  No cranial nerve deficits.  Power equal in all extremities.    Skin: Warm and dry.  Right foot wrapped in elastic compression bandage  Laboratory Data:  CBC: No results for input(s): WBC, NEUTROABS, HGB, HCT, MCV, PLT in the last 168 hours.  Basic Metabolic Panel: No results for input(s): NA, K, CL, CO2, GLUCOSE, BUN, CREATININE, CALCIUM, MG, PHOS in the last 168 hours.  Liver  Function Tests: No results for input(s): AST, ALT, ALKPHOS,  BILITOT, PROT, ALBUMIN in the last 168 hours. No results for input(s): LIPASE, AMYLASE in the last 168 hours. No results for input(s): AMMONIA in the last 168 hours.  Cardiac Enzymes: No results for input(s): CKTOTAL, CKMB, CKMBINDEX, TROPONINI in the last 168 hours.  BNP: Invalid input(s): POCBNP  CBG: Recent Labs  Lab 02/15/21 1610  GLUCAP 186*    Radiological Exams on Admission: No results found.  EKG: Not available for review  Time spent:55 minutes  Shamiah Kahler [facility] 02/15/2021

## 2021-02-15 NOTE — Progress Notes (Addendum)
Pt has home CPAP, wife in the room with pt and stated no assistance needed from RT.

## 2021-02-15 NOTE — Progress Notes (Signed)
Office Visit Note   Patient: Shane [name]           Date of Birth: 16-Oct-1941           MRN: [id number] Visit Date: 02/15/2021              Requested by: Burnard Bunting, MD [address] [place],  [place] [address] PCP: Burnard Bunting, MD  Chief Complaint  Patient presents with  . 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  Patient states he has had increased pain since last night he did call Dr. Marlou Sa over the weekend and is seen today for urgent evaluation.  Assessment & Plan: Visit Diagnoses:  1. Cellulitis of right foot     Plan: Patient's glucose is elevated he has not eaten he does have cellulitis and ischemic changes to the right foot will plan for admission start IV antibiotics consult hospitalist to evaluate his pulmonary status and will start local treatment to try to improve his microcirculation.  Discussed with the patient he may need repeat surgery for either debridement of the fusion or revision of the amputation.  Follow-Up Instructions: Return in about 1 week (around 02/22/2021).   Ortho Exam  Patient is alert, oriented, no adenopathy, well-dressed, normal affect, normal respiratory effort. Examination patient states he has a bad cough that comes and goes.  Patient states that his blood sugars are in the high 200s and that he has not eaten today.  Patient is status post endovascular revascularization to the right lower extremity a Doppler was used and patient has a strong biphasic anterior tib and dorsalis pedis pulse and a strong posterior tib biphasic pulse.  Patient has induration and tenderness to palpation along the surgical incision.  Patient does have significant microcirculatory changes he has an  ischemic ulcer over the heel and ischemic ulcer over the medial aspect of the great toe metatarsal head and he has ischemic changes to the ray amputation.  Imaging: No results found. No images are attached to the encounter.  Labs: Lab Results  Component Value Date   HGBA1C 6.6 (H) 12/09/2020   HGBA1C 7.6 (H) 03/28/2020   HGBA1C 7.9 (H) 12/19/2016   ESRSEDRATE 22 (H) 10/14/2016   CRP 8.3 (H) 12/09/2020   CRP 4.1 (H) 07/17/2020   REPTSTATUS 01/07/2021 FINAL 01/01/2021   GRAMSTAIN  01/01/2021    NO WBC SEEN ABUNDANT GRAM POSITIVE COCCI Performed at [facility], [place] [address], [place], [place] [address]    CULT  01/01/2021    ABUNDANT SERRATIA MARCESCENS RARE ENTEROCOCCUS FAECALIS MIXED ANAEROBIC FLORA PRESENT.  CALL LAB IF FURTHER IID REQUIRED.    LABORGA SERRATIA MARCESCENS 01/01/2021   LABORGA ENTEROCOCCUS FAECALIS 01/01/2021     Lab Results  Component Value Date   ALBUMIN 2.0 (L) 12/16/2020   ALBUMIN 2.5 (L) 12/09/2020   ALBUMIN 3.0 (L) 12/08/2020    Lab Results  Component Value Date   MG 1.7 12/16/2020   MG 1.9 12/09/2020   MG 1.7 12/08/2020   No results found for: VD25OH  No results found for: PREALBUMIN CBC EXTENDED Latest Ref Rng & Units 01/22/2021 12/16/2020 12/15/2020  WBC 4.0 - 10.5 K/uL 9.7 7.9 9.2  RBC 4.22 - 5.81 MIL/uL 3.96(L) 2.83(L)  2.72(L)  HGB 13.0 - 17.0 g/dL 11.5(L) 8.8(L) 8.4(L)  HCT 39.0 - 52.0 % 36.7(L) 26.9(L) 25.7(L)  PLT 150 - 400 K/uL 283 242 204  NEUTROABS 1.7 - 7.7 K/uL - 5.4 -  LYMPHSABS 0.7 - 4.0 K/uL - 1.1 -     Body mass index is 32.69 kg/m.  Orders:  No orders of the defined types were placed in this encounter.  No orders of the defined types were placed in this encounter.    Procedures: No procedures performed  Clinical Data: No additional findings.  ROS:  All other systems negative, except as noted in the HPI. Review of Systems  Objective: Vital Signs: Ht 6' (1.829 m)   Wt 241 lb (109.3 kg)   BMI  32.69 kg/m   Specialty Comments:  No specialty comments available.  PMFS History: Patient Active Problem List   Diagnosis Date Noted  . 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 Peripheral vascular disease ([place]) 12/29/2020  . CHF (congestive heart failure), NYHA class II, chronic, diastolic ([place]) 72/53/6644  . History of COVID-19 12/22/2020  . SSS (sick sinus syndrome) ([place]) 12/22/2020  . Acute bacterial endocarditis   . MSSA bacteremia 12/09/2020  . Diabetic foot ulcer ([place]) 12/09/2020  . Foot drop, right foot   . Generalized weakness 12/08/2020  . Dehydration with hyponatremia 12/08/2020  . Atrial fibrillation, chronic ([place]) 12/08/2020  . Elevated troponin level not due myocardial infarction 12/08/2020  . Adrenal insufficiency ([place]) 11/14/2020  . Orthostatic hypotension 11/14/2020  . Physical deconditioning 11/14/2020  . Difficulty sleeping 11/07/2020  . Postoperative anemia due to acute blood loss 11/07/2020  . Right ventricular dysfunction 11/05/2020  . 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 Hearing loss 11/03/2020  . Cardiac device in situ 09/09/2020  . Coronary atherosclerosis due to lipid rich plaque 09/02/2020  . COVID-19 virus infection 07/18/2020  . Coronary artery disease involving native heart without angina pectoris 07/10/2020  . Chronic kidney disease, stage 3a ([place]) 03/29/2020  . 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 ([place]) 03/16/2020  . Right epiretinal membrane 03/16/2020  . Posterior vitreous detachment of right eye 03/16/2020  . Aortic stenosis, moderate 03/12/2020  . 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 Near syncope 04/27/2018  . Hypoglycemia due to insulin 01/06/2018  . Essential hypertension 01/06/2018  . Diabetes mellitus type 2, with complication, on long  term insulin pump ([place]) 01/06/2018  . Syncope and collapse 01/05/2018  . Encounter for therapeutic drug monitoring 06/29/2017  . OSA on CPAP 05/05/2017  . 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[place]) 01/05/2017  . Abnormal chest x-ray 10/12/2016  . Benign neoplasm of colon 01/14/2013   Past Medical History:  Diagnosis Date  . 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 Cataract   . Chronic kidney disease    d/t DM  . CKD (chronic kidney disease), stage III ([place])   . Diabetes mellitus    Vgo disposal insulin bolus  simular to insulin pump  . Dyspnea   . GERD (gastroesophageal reflux disease)   . History of kidney stones    passed  . Hyperlipidemia   . Hypertension   . Idiopathic pulmonary fibrosis ([place]) 11/2016  . ILD (interstitial lung disease) ([place])   . Moderate aortic stenosis    a. 10/2019 Echo: EF 55-60%, Gr2 DD. Nl RV.   Marland Kitchen  Neuromuscular disorder ([place])   . Neuropathy associated with endocrine disorder ([place])   . Nonobstructive CAD (coronary artery disease)    a. 2012 Cath: mod, nonobs dzs; b. 10/2016 MV: EF 60%, no ischemia.  . OSA on CPAP 05/05/2017   Unattended Home Sleep Test 7/2/813-AHI 38.6/hour, desaturation to 64%, body weight 261 pounds  . PONV (postoperative nausea and vomiting)   . Sleep apnea     uses cpap asked to bring mask and tubing    Family History  Problem Relation Age of Onset  . Diabetes Mellitus II Mother   . Emphysema Father 23  . Heart attack Father   . Colon cancer Neg Hx   . Esophageal cancer Neg Hx   . Rectal cancer Neg Hx   . Stomach cancer Neg Hx     Past Surgical History:  Procedure Laterality Date  . ABDOMINAL AORTOGRAM W/LOWER EXTREMITY N/A 12/10/2020   Procedure: ABDOMINAL AORTOGRAM W/LOWER EXTREMITY;  Surgeon: Cherre Robins, MD;  Location: [facility];  Service: Cardiovascular;  Laterality: N/A;  . AMPUTATION Right 01/22/2021   Procedure: 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;  Surgeon: [name], MD;  Location: [facility];  Service: Orthopedics;  Laterality:  Right;  . ANKLE FUSION Right 01/22/2021   Procedure: RIGHT FOOT TIBIOCALCANEAL FUSION;  Surgeon: [name], MD;  Location: [facility];  Service: Orthopedics;  Laterality: Right;  . ANTERIOR FUSION CERVICAL SPINE  2012  . CARDIAC CATHETERIZATION  2011  . CARDIAC CATHETERIZATION N/A 11/09/2016   Procedure: Right Heart Cath;  Surgeon: Belva Crome, MD;  Location: [facility];  Service: Cardiovascular;  Laterality: N/A;  . carpel tunnel     left wrist  . CATARACT EXTRACTION    . CATARACT EXTRACTION W/ INTRAOCULAR LENS  IMPLANT, BILATERAL  2013  . CERVICAL LAMINECTOMY  2012  . COLONOSCOPY N/A 01/14/2013   Procedure: COLONOSCOPY;  Surgeon: Irene Shipper, MD;  Location: [facility];  Service: Endoscopy;  Laterality: N/A;  . CORONARY ARTERY BYPASS GRAFT  11/04/2020   LIMA-LAD, SVG-OM1, SVG-PDA (Dr Marney [facility]) dc 11/18/2020  . EYE SURGERY    . [place]   left  . LEFT HEART CATH AND CORONARY ANGIOGRAPHY N/A 07/10/2020   Procedure: LEFT HEART CATH AND CORONARY ANGIOGRAPHY;  Surgeon: Sherren Mocha, MD;  Location: [facility];  Service: Cardiovascular;  Laterality: N/A;  . LUMBAR LAMINECTOMY  2003  . LUNG BIOPSY Left 12/26/2016   Procedure: LUNG BIOPSY;  Surgeon: [name], MD;  Location: [facility];  Service: Thoracic;  Laterality: Left;  . PACEMAKER IMPLANT N/A 03/30/2020   Procedure: PACEMAKER IMPLANT;  Surgeon: Evans Lance, MD;  Location: [facility];  Service: Cardiovascular;  Laterality: N/A;  . PERIPHERAL VASCULAR INTERVENTION Right 12/10/2020   Procedure: PERIPHERAL VASCULAR INTERVENTION;  Surgeon: Cherre Robins, MD;  Location: [facility];  Service: Cardiovascular;  Laterality: Right;  SFA  . POSTERIOR FUSION CERVICAL SPINE  2012  . PPM GENERATOR REMOVAL N/A 12/14/2020   Procedure: PPM GENERATOR REMOVAL;  Surgeon: Evans Lance, MD;  Location: [facility];  Service: Cardiovascular;  Laterality: N/A;  . TEE WITHOUT CARDIOVERSION N/A  12/11/2020   Procedure: TRANSESOPHAGEAL ECHOCARDIOGRAM (TEE);  Surgeon: Geralynn Rile, MD;  Location: [facility];  Service: Cardiovascular;  Laterality: N/A;  . TRIGGER FINGER RELEASE  2011   4th finger left hand  . VIDEO ASSISTED THORACOSCOPY Left 12/26/2016   Procedure: VIDEO  ASSISTED THORACOSCOPY;  Surgeon: [name], MD;  Location: [facility];  Service: Thoracic;  Laterality: Left;  Marland Kitchen VIDEO BRONCHOSCOPY N/A 12/26/2016   Procedure: VIDEO BRONCHOSCOPY;  Surgeon: [name], MD;  Location: [facility];  Service: Thoracic;  Laterality: N/A;   Social History   Occupational History  . Not on file  Tobacco Use  . Smoking status: Never Smoker  . Smokeless tobacco: Never Used  Vaping Use  . Vaping Use: Never used  Substance and Sexual Activity  . Alcohol use: No  . Drug use: No  . Sexual activity: Not Currently

## 2021-02-15 NOTE — Progress Notes (Unsigned)
admit

## 2021-02-15 NOTE — Progress Notes (Signed)
Inpatient Diabetes Program Recommendations  AACE/ADA: New Consensus Statement on Inpatient Glycemic Control (2015)  Target Ranges:  Prepandial:   less than 140 mg/dL      Peak postprandial:   less than 180 mg/dL (1-2 hours)      Critically ill patients:  140 - 180 mg/dL   Lab Results  Component Value Date   GLUCAP 162 (H) 02/15/2021   HGBA1C 7.8 (H) 02/15/2021    Review of Glycemic Control  Diabetes history: DM2 Outpatient Diabetes medications: Humalog 10 units TID with meals, Farxiga 10 mg QD Current orders for Inpatient glycemic control: Novolog 0-9 units TID with meals + 10 units TID, Farxiga 10 mg QD  HgbA1C - 7.8% Blood sugars trending well  Inpatient Diabetes Program Recommendations:     Agree with orders.  Follow glucose trends while inpatient.  Thank you. Lorenda Peck, RD, LDN, CDE Inpatient Diabetes Coordinator [phone]

## 2021-02-16 ENCOUNTER — Ambulatory Visit: Payer: Medicare Other | Admitting: Orthopedic Surgery

## 2021-02-16 DIAGNOSIS — I5032 Chronic diastolic (congestive) heart failure: Secondary | ICD-10-CM | POA: Diagnosis not present

## 2021-02-16 DIAGNOSIS — N1831 Chronic kidney disease, stage 3a: Secondary | ICD-10-CM | POA: Diagnosis not present

## 2021-02-16 DIAGNOSIS — Z89431 Acquired absence of right foot: Secondary | ICD-10-CM

## 2021-02-16 DIAGNOSIS — I482 Chronic atrial fibrillation, unspecified: Secondary | ICD-10-CM | POA: Diagnosis not present

## 2021-02-16 DIAGNOSIS — M71071 Abscess of bursa, right ankle and foot: Secondary | ICD-10-CM | POA: Diagnosis not present

## 2021-02-16 LAB — GLUCOSE, CAPILLARY
Glucose-Capillary: 152 mg/dL — ABNORMAL HIGH (ref 70–99)
Glucose-Capillary: 186 mg/dL — ABNORMAL HIGH (ref 70–99)
Glucose-Capillary: 167 mg/dL — ABNORMAL HIGH (ref 70–99)
Glucose-Capillary: 117 mg/dL — ABNORMAL HIGH (ref 70–99)

## 2021-02-16 MED ORDER — ENSURE ENLIVE PO LIQD
237.0000 mL | Freq: Two times a day (BID) | ORAL | Status: DC
  Administered 2021-02-16 – 2021-02-22 (×11): 237 mL via ORAL

## 2021-02-16 MED ORDER — VANCOMYCIN HCL 1250 MG/250ML IV SOLN: 1250.0000 mg | INTRAVENOUS | Status: DC

## 2021-02-16 MED ORDER — METHOCARBAMOL 500 MG PO TABS
500.0000 mg | ORAL_TABLET | Freq: Four times a day (QID) | ORAL | Status: DC | PRN
  Administered 2021-02-16 – 2021-02-21 (×11): 500 mg via ORAL
  Filled 2021-02-16 (×12): qty 1

## 2021-02-16 MED ORDER — VANCOMYCIN HCL 1500 MG/300ML IV SOLN
1500.0000 mg | Freq: Once | INTRAVENOUS | Status: AC
  Administered 2021-02-16: 1500 mg via INTRAVENOUS
  Filled 2021-02-16: qty 300

## 2021-02-16 MED ORDER — PIPERACILLIN-TAZOBACTAM 3.375 G IVPB
3.3750 g | Freq: Three times a day (TID) | INTRAVENOUS | Status: AC
Start: 2021-02-16 — End: 2021-02-17
  Administered 2021-02-16 – 2021-02-17 (×4): 3.375 g via INTRAVENOUS
  Filled 2021-02-16 (×3): qty 50

## 2021-02-16 NOTE — Progress Notes (Signed)
Orthopedic Tech Progress Note Patient Details:  Shane [name] 1941-02-09 [id number] Called in order to [facility] for a GRADUATED COMPRESSION STOCKING Patient ID: Shane [name], male   DOB: Sep 24, 1941, 80 y.o.   MRN: [id number]   [name] 02/16/2021, 10:41 AM

## 2021-02-16 NOTE — Progress Notes (Signed)
Patient ID: Shane [name], male   DOB: May 20, 1941, 80 y.o.   MRN: [id number] Patient is seen for follow-up for cellulitis drainage and swelling of the right ankle status post fifth ray amputation and ankle fusion.  Patient states he feels much better his glucose is under better control the swelling has decreased the cellulitis is resolving and there is essentially no drainage at this time.  Patient is showing rapid improvement after IV antibiotics.  We will continue with elevation and IV antibiotics nonweightbearing on the right lower extremity.  Patient's white cell count is 10.6 hemoglobin 8.4

## 2021-02-16 NOTE — Progress Notes (Signed)
Pharmacy Antibiotic Note  Shane [name] is a 80 y.o. male admitted on 02/15/2021 with surgical site infection. Patient had 5th ray amputation 01/22/21 and now presents with ankle swelling, drainage and cellulitis. Pharmacy has been consulted for vancomycin and pipercillin/tazobactam dosing.  ClCr ~28 ml/min. Previously grew serratia and E faecalis from same site on 01/01/21. Patient feels better after drainage.   3/15 Vancomycin 1250mg  Q 48 hr Scr used: 2.66 mg/dL Weight: 109 kg Vd coeff: 0.5 L/kg Est AUC: 460  Plan:  Vancomcyin 1500mg  x1 then 1250 Q48hr Pip/tazo 3g Q8hr EI  Monitor cultures, clinical status, renal fx Narrow abx as able and f/u duration    Weight: 109 kg (240 lb 4.8 oz)  Temp (24hrs), Avg:98.2 F (36.8 C), Min:96.9 F (36.1 C), Max:99.5 F (37.5 C)  Recent Labs  Lab 02/15/21 1754  WBC 10.6*  CREATININE 2.66*    Estimated Creatinine Clearance: 28.7 mL/min (A) (by C-G formula based on SCr of 2.66 mg/dL (H)).    Allergies  Allergen Reactions  . Codeine Hives and Itching  . Ofev [Nintedanib] Diarrhea    SEVERE DIARRHEA  . Other Diarrhea and Other (See Comments)    Esbriet causes elevated LFTs. D/C on 06/14/17 and SEVERE DIARRHEA    Antimicrobials this admission: azithro 3/14 >> 3/18 Vanc 3/15 >>  Piptazo 3/15 >>    Microbiology results: 3/14 BCx: pend  2/18 MRSA PCR: neg 1/28 RLE wound: Serratia (S- CTX), E faecalis    Thank you for allowing pharmacy to be a part of this patient's care.   Benetta Spar, PharmD, BCPS, BCCP Clinical Pharmacist  Please check AMION for all [facility] phone numbers After 10:00 PM, call [place] [phone]

## 2021-02-16 NOTE — Progress Notes (Signed)
PROGRESS NOTE  Shane [name] BJY:[id number] DOB: May 07, 1941 DOA: 02/15/2021 PCP: Burnard Bunting, MD   LOS: 1 day   Brief narrative: Patient is a 80 years old male with past medical history of idiopathic pulmonary fibrosis who was on immunotherapy, hypertension, sleep apnea, diabetes mellitus type 2, chronic kidney disease stage IIIa, chronic atrial fibrillation, status post pacemaker congestive heart failure diastolic status post CABG, peripheral vascular disease status post right lower extremity endovascular revascularization in the past, fifth right toe ray amputation and ankle fusion in the past who was admitted by orthopedic service for cellulitis of the right foot.    Medical team was consulted for management of diabetes hypertension and his medical issues.   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  On vancomycin and Zosyn as per orthopedics.  Patient with previous history of  Ray amputation and ankle fusion. On Dilaudid.  States that he could not tolerate Vicodin as well.  On Percocet at this time.  Cough.    History of sinus congestion.  Continue  Flonase spray, Protonix for now.  Possible mild bronchitis.  On Zithromax nebulizers and inhaler.   Diabetes mellitus type 2 on insulin.  With peripheral neuropathy, retinopathy.  Continue Sliding-scale insulin, diabetic diet.  On gabapentin 3 times daily.    Continue Farxiga  Chronic diastolic congestive heart failure.  Status post CABG in the past.  No chest pain.    Continue intake and output charting,  daily weights.  No acute decompensation at this time.  We will add fluid restriction 1500 mils per day.  2 g sodium diet.  Continue  metoprolol, aspirin, Lipitor.  Hold the losartan due to increased creatinine levels.  Essential hypertension.  Continue losartan and metoprolol.  Closely monitor blood pressure.  History of chronic atrial fibrillation. Rate controlled at this time.  On Eliquis and metoprolol.    Continue Eliquis if no plans for surgical intervention  History of idiopathic pulmonary fibrosis on immunotherapy, follows up with pulmonary as outpatient.  Patient gets 3 monthly infusions with Dr. Chase Caller.  Recently had infusion done.  Patient states that his pulmonary fibrosis is under control.  He is not on supplemental oxygen.  Sleep apnea on CPAP at home.  Resume CPAP at the hospital.  Anemia of chronic kidney disease. Monitor CBC.  Obesity.  Would benefit from weight loss as outpatient.  Mild acute kidney injury on chronic kidney disease stage IIIa. Baseline creatinine around 1.2-1.5.    Creatinine today at 2.6.  Hold losartan.  On gentle IV fluids.  Consider discontinuation of fluids by a.m.  Peripheral vascular disease.  On pentoxifylline.  Continue.  Generalized weakness and deconditioning.  Will get PT evaluation once okay with orthopedics.  DVT prophylaxis: SCDs Start: 02/15/21 1703 apixaban (ELIQUIS) tablet 5 mg    Code Status: Full code  Family Communication:  None  Status is: Inpatient  Dispo: The patient is from: Home  Procedures:  None  Anti-infectives:  Marland Kitchen Vancomycin and Zosyn . Azithromycin  Anti-infectives (From admission, onward)   Start  Dose/Rate Route Frequency Ordered Stop   02/18/21 0600  vancomycin (VANCOREADY) IVPB 1250 mg/250 mL        1,250 mg 166.7 mL/hr over 90 Minutes Intravenous Every 48 hours 02/16/21 0840     02/16/21 0930  vancomycin (VANCOREADY) IVPB 1500 mg/300 mL        1,500 mg 150 mL/hr over 120 Minutes  Intravenous  Once 02/16/21 0830     02/16/21 0915  piperacillin-tazobactam (ZOSYN) IVPB 3.375 g        3.375 g 12.5 mL/hr over 240 Minutes Intravenous Every 8 hours 02/16/21 0906     02/15/21 1900  azithromycin (ZITHROMAX) tablet 500 mg        500 mg Oral Daily 02/15/21 1809 02/18/21 0959       Subjective: Today, patient was seen and examined at bedside.  Patient states that he feels better today.  Denies any nausea vomiting fever chills or rigor.  Objective: Vitals:   02/16/21 0456 02/16/21 0620  BP:  (!) 100/33  Pulse: 98 62  Resp: 18 18  Temp: 98.6 F (37 C)   SpO2:      Intake/Output Summary (Last 24 hours) at 02/16/2021 1127 Last data filed at 02/16/2021 0912 Gross per 24 hour  Intake -  Output 250 ml  Net -250 ml   Filed Weights   02/16/21 0455  Weight: 109 kg   Body mass index is 32.59 kg/m.   Physical Exam:  GENERAL: Patient is alert awake and oriented. Not in obvious distress.  Obese built HENT: No scleral pallor or icterus. Pupils equally reactive to light. Oral mucosa is moist NECK: is supple, no gross swelling noted. CHEST: Diminished breath sounds bilaterally.  Crackles noted bilaterally from previous IPF.  Previous CABG scar noted CVS: S1 and S2 heard, no murmur. Regular rate and rhythm.  ABDOMEN: Soft, non-tender, bowel sounds are present. EXTREMITIES: No edema.  Right foot edema noted.  Right foot wrapped with elastic compression bandage. CNS: Cranial nerves are intact. No focal motor deficits. SKIN: warm and dry, right foot wrapped with elastic compression bandage.  Data Review: I have personally reviewed the following laboratory data and studies,  CBC: Recent Labs  Lab 02/15/21 1754  WBC 10.6*  HGB 8.4*  HCT 26.0*  MCV 89.0  PLT 322   Basic Metabolic Panel: Recent Labs  Lab 02/15/21 1754 02/15/21 1811  NA 126*  --   K 4.8  --   CL 90*  --   CO2 23  --   GLUCOSE 191*  --   BUN 64*  --   CREATININE 2.66*  --   CALCIUM 8.9  --    MG  --  2.2  PHOS  --  5.7*   Liver Function Tests: Recent Labs  Lab 02/15/21 1754  AST 21  ALT 13  ALKPHOS 85  BILITOT 1.1  PROT 8.1  ALBUMIN 2.6*   No results for input(s): LIPASE, AMYLASE in the last 168 hours. No results for input(s): AMMONIA in the last 168 hours. Cardiac Enzymes: No results for input(s): CKTOTAL, CKMB, CKMBINDEX, TROPONINI in the last 168 hours. BNP (last 3 results) Recent Labs    07/17/20 1438 12/08/20 1805  BNP 210.9* 195.4*    ProBNP (last 3 results) No results for input(s): PROBNP in the last 8760 hours.  CBG: Recent Labs  Lab 02/15/21 1610 02/15/21 2101 02/16/21 0634 02/16/21 0906  GLUCAP 186* 162* 152* 186*   Recent Results (from the past 240 hour(s))  Culture,  blood (routine x 2)     Status: None (Preliminary result)   Collection Time: 02/15/21  6:49 PM   Specimen: BLOOD  Result Value Ref Range Status   Specimen Description BLOOD LEFT ANTECUBITAL  Final   Special Requests   Final    BOTTLES DRAWN AEROBIC ONLY Blood Culture results may not be optimal due to an inadequate volume of blood received in culture bottles   Culture   Final    NO GROWTH < 24 HOURS Performed at [facility], [place] [address], [place], [place] [address]    Report Status PENDING  Incomplete  Culture, blood (routine x 2)     Status: None (Preliminary result)   Collection Time: 02/15/21  6:49 PM   Specimen: BLOOD LEFT ARM  Result Value Ref Range Status   Specimen Description BLOOD LEFT ARM  Final   Special Requests   Final    BOTTLES DRAWN AEROBIC ONLY Blood Culture results may not be optimal due to an inadequate volume of blood received in culture bottles   Culture   Final    NO GROWTH < 24 HOURS Performed at [facility], [place] [address], [place], [place] [address]    Report Status PENDING  Incomplete     Studies: No results found.    Flora Lipps, MD  [facility] 02/16/2021  If 7PM-7AM, please contact  night-coverage

## 2021-02-16 NOTE — H&P (Signed)
Shane [name] is an 80 y.o. male.   Chief Complaint: Right Ankle cellulitis HPI:  . 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  Patient states he has had increased pain since last night he did call Dr. Marlou Sa over the weekend and is seen today for urgent evaluation.   Past Medical History:  Diagnosis Date  . 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 Cataract   . Chronic kidney disease    d/t DM  . CKD (chronic kidney disease), stage III ([place])   . Diabetes mellitus    Vgo disposal insulin bolus  simular to insulin pump  . Dyspnea   . GERD (gastroesophageal reflux disease)   . History of kidney stones    passed  . Hyperlipidemia   . Hypertension   . Idiopathic pulmonary fibrosis ([place]) 11/2016  . ILD (interstitial lung disease) ([place])   . Moderate aortic stenosis    a. 10/2019 Echo: EF 55-60%, Gr2 DD. Nl RV.   Marland Kitchen Neuromuscular disorder ([place])   . Neuropathy associated with endocrine disorder ([place])   . Nonobstructive CAD (coronary artery disease)    a. 2012 Cath: mod, nonobs dzs; b. 10/2016 MV: EF 60%, no ischemia.  . OSA on CPAP 05/05/2017   Unattended Home Sleep Test 7/2/813-AHI 38.6/hour, desaturation to 64%, body weight 261 pounds  . PONV (postoperative nausea and vomiting)   . Sleep apnea     uses cpap asked to bring mask and tubing    Past Surgical History:  Procedure Laterality Date  . ABDOMINAL AORTOGRAM W/LOWER EXTREMITY N/A 12/10/2020   Procedure: ABDOMINAL AORTOGRAM W/LOWER EXTREMITY;  Surgeon: Cherre Robins, MD;  Location: [facility];  Service: Cardiovascular;  Laterality: N/A;  . AMPUTATION Right 01/22/2021   Procedure: 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;  Surgeon: [name], MD;  Location: [facility];  Service:  Orthopedics;  Laterality: Right;  . ANKLE FUSION Right 01/22/2021   Procedure: RIGHT FOOT TIBIOCALCANEAL FUSION;  Surgeon: [name], MD;  Location: [facility];  Service: Orthopedics;  Laterality: Right;  . ANTERIOR FUSION CERVICAL SPINE  2012  . CARDIAC CATHETERIZATION  2011  . CARDIAC CATHETERIZATION N/A 11/09/2016   Procedure: Right Heart Cath;  Surgeon: Belva Crome, MD;  Location: [facility];  Service: Cardiovascular;  Laterality: N/A;  . carpel tunnel     left wrist  . CATARACT EXTRACTION    . CATARACT EXTRACTION W/ INTRAOCULAR LENS  IMPLANT, BILATERAL  2013  . CERVICAL LAMINECTOMY  2012  . COLONOSCOPY N/A 01/14/2013   Procedure: COLONOSCOPY;  Surgeon: Irene Shipper, MD;  Location: [facility];  Service: Endoscopy;  Laterality: N/A;  . CORONARY ARTERY BYPASS GRAFT  11/04/2020   LIMA-LAD, SVG-OM1, SVG-PDA (Dr Marney [facility]) dc 11/18/2020  . EYE SURGERY    . [place]   left  . LEFT HEART CATH AND CORONARY ANGIOGRAPHY N/A 07/10/2020   Procedure: LEFT HEART CATH AND CORONARY ANGIOGRAPHY;  Surgeon: Sherren Mocha, MD;  Location: [facility];  Service: Cardiovascular;  Laterality: N/A;  . LUMBAR LAMINECTOMY  2003  . LUNG BIOPSY Left 12/26/2016   Procedure: LUNG BIOPSY;  Surgeon: [name], MD;  Location: [facility];  Service: Thoracic;  Laterality: Left;  . PACEMAKER IMPLANT N/A 03/30/2020   Procedure: PACEMAKER IMPLANT;  Surgeon: Evans Lance, MD;  Location: [facility];  Service: Cardiovascular;  Laterality: N/A;  . PERIPHERAL VASCULAR INTERVENTION Right 12/10/2020   Procedure: PERIPHERAL VASCULAR INTERVENTION;  Surgeon: Cherre Robins, MD;  Location: [facility];  Service: Cardiovascular;  Laterality: Right;  SFA  . POSTERIOR FUSION CERVICAL SPINE  2012  . PPM GENERATOR REMOVAL N/A 12/14/2020   Procedure: PPM GENERATOR REMOVAL;  Surgeon: Evans Lance, MD;  Location: [facility];  Service: Cardiovascular;  Laterality: N/A;  . TEE  WITHOUT CARDIOVERSION N/A 12/11/2020   Procedure: TRANSESOPHAGEAL ECHOCARDIOGRAM (TEE);  Surgeon: Geralynn Rile, MD;  Location: [facility];  Service: Cardiovascular;  Laterality: N/A;  . TRIGGER FINGER RELEASE  2011   4th finger left hand  . VIDEO ASSISTED THORACOSCOPY Left 12/26/2016   Procedure: VIDEO ASSISTED THORACOSCOPY;  Surgeon: [name], MD;  Location: [facility];  Service: Thoracic;  Laterality: Left;  Marland Kitchen VIDEO BRONCHOSCOPY N/A 12/26/2016   Procedure: VIDEO BRONCHOSCOPY;  Surgeon: [name], MD;  Location: [facility];  Service: Thoracic;  Laterality: N/A;    Family History  Problem Relation Age of Onset  . Diabetes Mellitus II Mother   . Emphysema Father 84  . Heart attack Father   . Colon cancer Neg Hx   . Esophageal cancer Neg Hx   . Rectal cancer Neg Hx   . Stomach cancer Neg Hx    Social History:  reports that he has never smoked. He has never used smokeless tobacco. He reports that he does not drink alcohol and does not use drugs.  Allergies:  Allergies  Allergen Reactions  . Codeine Hives and Itching  . Ofev [Nintedanib] Diarrhea    SEVERE DIARRHEA  . Other Diarrhea and Other (See Comments)    Esbriet causes elevated LFTs. D/C on 06/14/17 and SEVERE DIARRHEA    Medications Prior to Admission  Medication Sig Dispense Refill  . albuterol (VENTOLIN HFA) 108 (90 Base) MCG/ACT inhaler Inhale 2 puffs into the lungs every 4 (four) hours as needed for wheezing or shortness of breath.    Marland Kitchen apixaban (ELIQUIS) 5 MG TABS tablet Take 1 tablet (5 mg total) by mouth 2 (two) times daily. 60 tablet 11  . Ascorbic Acid (VITAMIN C WITH ROSE HIPS) 500 MG tablet Take 500 mg by mouth daily.    Marland Kitchen aspirin EC 81 MG tablet Take 81 mg by mouth daily.    Marland Kitchen atorvastatin (LIPITOR) 40 MG tablet Take 20 mg by mouth in the morning and at bedtime.    . Ensure Max Protein (ENSURE MAX PROTEIN) LIQD Take 330 mLs (11 oz total) by mouth 2 (two) times daily. 3330 mL 0  . FARXIGA 10 MG  TABS tablet Take 10 mg by mouth daily.    . furosemide (LASIX) 40 MG tablet Take 40 mg by mouth daily.    Marland Kitchen HYDROcodone-acetaminophen (NORCO/VICODIN) 5-325 MG tablet Take 1 tablet by mouth every 4 (four) hours as needed. 30 tablet 0  . insulin lispro (HUMALOG) 100 UNIT/ML injection Inject 0.07 mLs (7 Units total) into the skin 3 (three) times daily with meals. (Patient taking differently: Inject 10 Units into the skin 3 (three) times daily with meals.) 10 mL 0  . iron polysaccharides (NIFEREX) 150 MG capsule Take 150 mg by mouth daily.    Marland Kitchen LORazepam (ATIVAN) 1 MG tablet Take 1 mg by mouth at bedtime  as needed for sleep.     Marland Kitchen losartan (COZAAR) 50 MG tablet Take 50 mg by mouth daily.    . magnesium oxide (MAG-OX) 400 MG tablet TAKE 1 CAPSULE BY MOUTH ONCE DAILY (Patient taking differently: Take 400 mg by mouth daily.) 90 tablet 1  . metoprolol tartrate (LOPRESSOR) 50 MG tablet Take 25 mg by mouth 2 (two) times daily.    . Multiple Vitamin (MULTIVITAMIN WITH MINERALS) TABS tablet Take 1 tablet by mouth daily.    . ondansetron (ZOFRAN) 4 MG tablet Take 1 tablet (4 mg total) by mouth every 8 (eight) hours as needed for nausea or vomiting. 20 tablet 0  . pantoprazole (PROTONIX) 40 MG tablet Take 40 mg by mouth daily.    . polyethylene glycol powder (GLYCOLAX/MIRALAX) 17 GM/SCOOP powder Take 17 g by mouth daily as needed for moderate constipation.    . Continuous Blood Gluc Sensor (FREESTYLE LIBRE 14 DAY SENSOR) MISC 1 patch every 14 (fourteen) days.    Marland Kitchen EPINEPHrine 0.3 mg/0.3 mL IJ SOAJ injection Inject 0.3 mg into the muscle once as needed for anaphylaxis (max 3 doses).  0    Results for orders placed or performed during the hospital encounter of 02/15/21 (from the past 48 hour(s))  Glucose, capillary     Status: Abnormal   Collection Time: 02/15/21  4:10 PM  Result Value Ref Range   Glucose-Capillary 186 (H) 70 - 99 mg/dL    Comment: Glucose reference range applies only to samples taken after  fasting for at least 8 hours.  Hemoglobin A1c     Status: Abnormal   Collection Time: 02/15/21  5:54 PM  Result Value Ref Range   Hgb A1c MFr Bld 7.8 (H) 4.8 - 5.6 %    Comment: (NOTE) Pre diabetes:          5.7%-6.4%  Diabetes:              >6.4%  Glycemic control for   <7.0% adults with diabetes    Mean Plasma Glucose 177.16 mg/dL    Comment: Performed at [facility] [address], [place], [place] [address]  CBC     Status: Abnormal   Collection Time: 02/15/21  5:54 PM  Result Value Ref Range   WBC 10.6 (H) 4.0 - 10.5 K/uL   RBC 2.92 (L) 4.22 - 5.81 MIL/uL   Hemoglobin 8.4 (L) 13.0 - 17.0 g/dL   HCT 26.0 (L) 39.0 - 52.0 %   MCV 89.0 80.0 - 100.0 fL   MCH 28.8 26.0 - 34.0 pg   MCHC 32.3 30.0 - 36.0 g/dL   RDW 14.7 11.5 - 15.5 %   Platelets 318 150 - 400 K/uL   nRBC 0.0 0.0 - 0.2 %    Comment: Performed at [facility], [place] [address], [place], [place] [address]  Comprehensive metabolic panel     Status: Abnormal   Collection Time: 02/15/21  5:54 PM  Result Value Ref Range   Sodium 126 (L) 135 - 145 mmol/L   Potassium 4.8 3.5 - 5.1 mmol/L   Chloride 90 (L) 98 - 111 mmol/L   CO2 23 22 - 32 mmol/L   Glucose, Bld 191 (H) 70 - 99 mg/dL    Comment: Glucose reference range applies only to samples taken after fasting for at least 8 hours.   BUN 64 (H) 8 - 23 mg/dL   Creatinine, Ser 2.66 (H) 0.61 - 1.24 mg/dL   Calcium 8.9 8.9 -  10.3 mg/dL   Total Protein 8.1 6.5 - 8.1 g/dL   Albumin 2.6 (L) 3.5 - 5.0 g/dL   AST 21 15 - 41 U/L   ALT 13 0 - 44 U/L   Alkaline Phosphatase 85 38 - 126 U/L   Total Bilirubin 1.1 0.3 - 1.2 mg/dL   GFR, Estimated 24 (L) >60 mL/min    Comment: (NOTE) Calculated using the CKD-EPI Creatinine Equation (2021)    Anion gap 13 5 - 15    Comment: Performed at [facility] [address] [place], [place] [address]  Magnesium     Status: None   Collection Time: 02/15/21  6:11 PM  Result Value Ref Range   Magnesium 2.2 1.7 - 2.4  mg/dL    Comment: Performed at [facility], [place] [address], [place], [place] [address]  Phosphorus     Status: Abnormal   Collection Time: 02/15/21  6:11 PM  Result Value Ref Range   Phosphorus 5.7 (H) 2.5 - 4.6 mg/dL    Comment: Performed at [facility] [address] [place], [place] [address]  Glucose, capillary     Status: Abnormal   Collection Time: 02/15/21  9:01 PM  Result Value Ref Range   Glucose-Capillary 162 (H) 70 - 99 mg/dL    Comment: Glucose reference range applies only to samples taken after fasting for at least 8 hours.   No results found.  Review of Systems  All other systems reviewed and are negative.   Blood pressure (!) 100/33, pulse 62, temperature 98.6 F (37 C), temperature source Oral, resp. rate 18, weight 109 kg, SpO2 92 %. Physical Exam  Patient is alert, oriented, no adenopathy, well-dressed, normal affect, normal respiratory effort. Examination patient states he has a bad cough that comes and goes.  Patient states that his blood sugars are in the high 200s and that he has not eaten today.  Patient is status post endovascular revascularization to the right lower extremity a Doppler was used and patient has a strong biphasic anterior tib and dorsalis pedis pulse and a strong posterior tib biphasic pulse.  Patient has induration and tenderness to palpation along the surgical incision.  Patient does have significant microcirculatory changes he has an ischemic ulcer over the heel and ischemic ulcer over the medial aspect of the great toe metatarsal head and he has ischemic changes to the ray amputation.Heart RRR Lungs Clear Assessment/Plan Visit Diagnoses:  1. Cellulitis of right foot     Plan: Patient's glucose is elevated he has not eaten he does have cellulitis and ischemic changes to the right foot will plan for admission start IV antibiotics consult hospitalist to evaluate his pulmonary status and will start local treatment to try to  improve his microcirculation.  Discussed with the patient he may need repeat surgery for either debridement of the fusion or revision of the amputation.   Bevely Palmer Guhan [place], PA 02/16/2021, 6:37 AM

## 2021-02-16 NOTE — Progress Notes (Signed)
Initial Nutrition Assessment  DOCUMENTATION CODES:   Obesity unspecified  INTERVENTION:   -Ensure Enlive TID, each supplement provides 350 kcal, 20 grams protein   -MVI with minerals po daily   NUTRITION DIAGNOSIS:   Increased nutrient needs related to wound healing (R foot cellulitis) as evidenced by estimated needs.  GOAL:   Patient will meet greater than or equal to 90% of their needs  MONITOR:   PO intake,Supplement acceptance,Skin  REASON FOR ASSESSMENT:   Malnutrition Screening Tool    ASSESSMENT:   70 YOM admitted for abscess of bursa of right ankle. PMH of HLD, GERD, HTN, CKD3, neuromuscular disorder, DM, nonobstructive CAD s/p CABG (2021), ILD.  2/18 - post R tibial calcaneal fusion and fifth ray amputation, orthopedics following  3/13 - pt presented with R foot cellulitis, SOB and and medical management of DM and HTN 3/15 -  Pt presents with increased pain and admitted, IV antibiotics started to treat cellulitis and ischemic changes to R foot. Rapid improvement shown by pt post IV antibiotics.    Per chart, no meal documentation noted. Pt reports that he has not had a good appetite since being admitted and mentioned he has been consuming less than 50% of his trays. Pt reports that he has had a bad appetite for the past 2-3 days and hasn't had his usual appetite for the past few months. Pt reports a usual intake of:  Breakfast - bowl of cereal with milk or scrambled eggs  Lunch - typically skips lunch but sometimes has a pack of nabs  Dinner  - small portion of meat, vegetable, starch (pork roast, rice, vegetable)  Snack - fruit Pt reports that he drinks one Ensure at home typically with his breakfast. Pt has been receiving Ensure Max BID and mentioned he is tolerating it well. However due to increased protein and calories needs intern feels it's most appropriate to switch to Ensure Enlive due to it's higher calorie and protein content.  Pt reports a UBW of 262#.  Pt reports that he has lost 25# in the past 3 months due to his decreased appetite. Pt's weights reviewed and show multiple likely stated weights since 2/15. Pt has lost 9% since 12/16/20 which is significant for time frame.   Meds reivewed: Lasix (daily), Mag-Ox (daily), Klor Con (daily), Ensure Max (BID) Labs reviewed: CBG (152-186), Sodium (126, low), Corrected Calcium (10.02), Phosphorus (5.7, high)   I&O's reviewed: -250 L since admit  [name] reviewed: 150 mL x 24 hrs  NUTRITION - FOCUSED PHYSICAL EXAM:  Flowsheet Row Most Recent Value  Orbital Region No depletion  Upper Arm Region No depletion  Thoracic and Lumbar Region No depletion  Buccal Region No depletion  Temple Region No depletion  Clavicle Bone Region No depletion  Clavicle and Acromion Bone Region No depletion  Scapular Bone Region No depletion  Dorsal Hand Mild depletion  Patellar Region No depletion  Anterior Thigh Region Mild depletion  Posterior Calf Region No depletion  Edema (RD Assessment) Mild  [R lower extremity]  Hair Reviewed  Eyes Reviewed  Mouth Reviewed  Skin Reviewed  Nails Reviewed       Diet Order:   Diet Order            Diet Carb Modified Fluid consistency: Thin; Room service appropriate? Yes; Fluid restriction: 1500 mL Fluid  Diet effective now                 EDUCATION NEEDS:   No education needs  have been identified at this time  Skin:  Skin Assessment: Skin Integrity Issues: Skin Integrity Issues:: Incisions Incisions: R foot  Last BM:  3/13  Height:   Ht Readings from Last 1 Encounters:  02/15/21 6' (1.829 m)    Weight:   Wt Readings from Last 1 Encounters:  02/16/21 109 kg    Ideal Body Weight:  80.9 kg  BMI:  Body mass index is 32.59 kg/m.  Estimated Nutritional Needs:   Kcal:  2200-2400  Protein:  125-140 g  Fluid:  1.5 L    Salvadore Oxford, Dietetic Intern 02/16/2021 2:24 PM

## 2021-02-17 DIAGNOSIS — M71071 Abscess of bursa, right ankle and foot: Secondary | ICD-10-CM | POA: Diagnosis not present

## 2021-02-17 DIAGNOSIS — N1831 Chronic kidney disease, stage 3a: Secondary | ICD-10-CM | POA: Diagnosis not present

## 2021-02-17 DIAGNOSIS — I482 Chronic atrial fibrillation, unspecified: Secondary | ICD-10-CM | POA: Diagnosis not present

## 2021-02-17 DIAGNOSIS — I5032 Chronic diastolic (congestive) heart failure: Secondary | ICD-10-CM | POA: Diagnosis not present

## 2021-02-17 LAB — BASIC METABOLIC PANEL
Anion gap: 13 (ref 5–15)
BUN: 83 mg/dL — ABNORMAL HIGH (ref 8–23)
CO2: 22 mmol/L (ref 22–32)
Calcium: 8.4 mg/dL — ABNORMAL LOW (ref 8.9–10.3)
Chloride: 92 mmol/L — ABNORMAL LOW (ref 98–111)
Creatinine, Ser: 3.31 mg/dL — ABNORMAL HIGH (ref 0.61–1.24)
GFR, Estimated: 18 mL/min — ABNORMAL LOW (ref >60)
Glucose, Bld: 147 mg/dL — ABNORMAL HIGH (ref 70–99)
Potassium: 4.7 mmol/L (ref 3.5–5.1)
Sodium: 127 mmol/L — ABNORMAL LOW (ref 135–145)

## 2021-02-17 LAB — CBC
HCT: 26 % — ABNORMAL LOW (ref 39.0–52.0)
Hemoglobin: 8.5 g/dL — ABNORMAL LOW (ref 13.0–17.0)
MCH: 28.9 pg (ref 26.0–34.0)
MCHC: 32.7 g/dL (ref 30.0–36.0)
MCV: 88.4 fL (ref 80.0–100.0)
Platelets: 285 10*3/uL (ref 150–400)
RBC: 2.94 MIL/uL — ABNORMAL LOW (ref 4.22–5.81)
RDW: 14.7 % (ref 11.5–15.5)
WBC: 8.8 10*3/uL (ref 4.0–10.5)
nRBC: 0 % (ref 0.0–0.2)

## 2021-02-17 LAB — PHOSPHORUS: Phosphorus: 6.4 mg/dL — ABNORMAL HIGH (ref 2.5–4.6)

## 2021-02-17 LAB — GLUCOSE, CAPILLARY
Glucose-Capillary: 130 mg/dL — ABNORMAL HIGH (ref 70–99)
Glucose-Capillary: 117 mg/dL — ABNORMAL HIGH (ref 70–99)
Glucose-Capillary: 112 mg/dL — ABNORMAL HIGH (ref 70–99)
Glucose-Capillary: 162 mg/dL — ABNORMAL HIGH (ref 70–99)

## 2021-02-17 LAB — MAGNESIUM: Magnesium: 2.5 mg/dL — ABNORMAL HIGH (ref 1.7–2.4)

## 2021-02-17 MED ORDER — VANCOMYCIN HCL 1000 MG/200ML IV SOLN
1000.0000 mg | INTRAVENOUS | Status: DC
  Administered 2021-02-18: 1000 mg via INTRAVENOUS
  Filled 2021-02-17: qty 200

## 2021-02-17 MED ORDER — SODIUM CHLORIDE 0.9 % IV SOLN
2.0000 g | INTRAVENOUS | Status: DC
  Administered 2021-02-17 – 2021-02-22 (×6): 2 g via INTRAVENOUS
  Filled 2021-02-17 (×6): qty 20

## 2021-02-17 NOTE — Progress Notes (Addendum)
Pharmacy Antibiotic Note  Shane [name] is a 80 y.o. male admitted on 02/15/2021 with surgical site infection. Patient had 5th ray amputation 01/22/21 and now presents with ankle swelling, drainage and cellulitis. Pharmacy has been consulted for vancomycin dosing.  Wound site improving. Previously grew serratia and E faecalis from same site on 01/01/21. Discussed with MD who agreed with narrowing  pipercillin/tazobactam to ceftriaxone. Possible I&D on 3/18 if wound still draining.   Scr uptrending, will decrease vancomycin dose.   3/16 Vancomycin 1000mg  Q 48 hr Scr used: 3.31 mg/dL Weight: 112 kg Vd coeff: 0.5 L/kg Est AUC: 427  Plan:  Vancomcyin 1000mg  Q48hr Ceftriaxone 2g Q24hr  Monitor cultures, clinical status, renal fx Narrow abx as able and f/u duration    Weight: 112.3 kg (247 lb 9.2 oz)  Temp (24hrs), Avg:99 F (37.2 C), Min:98.6 F (37 C), Max:99.6 F (37.6 C)  Recent Labs  Lab 02/15/21 1754 02/17/21 0355  WBC 10.6* 8.8  CREATININE 2.66* 3.31*    Estimated Creatinine Clearance: 23.4 mL/min (A) (by C-G formula based on SCr of 3.31 mg/dL (H)).    Allergies  Allergen Reactions  . Codeine Hives and Itching  . Ofev [Nintedanib] Diarrhea    SEVERE DIARRHEA  . Other Diarrhea and Other (See Comments)    Esbriet causes elevated LFTs. D/C on 06/14/17 and SEVERE DIARRHEA    Antimicrobials this admission: azithro 3/14   Piptazo 3/15 >>3/16 Vanc 3/15 >>  Ceftriaxone 3/16>>    Microbiology results: 3/14 BCx: pend  2/18 MRSA PCR: neg 1/28 RLE wound: Serratia (S- CTX), E faecalis    Thank you for allowing pharmacy to be a part of this patient's care.   Benetta Spar, PharmD, BCPS, BCCP Clinical Pharmacist  Please check AMION for all [place] phone numbers After 10:00 PM, call [place] [phone]

## 2021-02-17 NOTE — Progress Notes (Addendum)
PROGRESS NOTE  Shane [name] QMV:[id number] DOB: 09/19/1941 DOA: 02/15/2021 PCP: Burnard Bunting, MD   LOS: 2 days   Brief narrative: Patient is a 80 years old male with past medical history of idiopathic pulmonary fibrosis who was on immunotherapy, hypertension, sleep apnea, diabetes mellitus type 2, chronic kidney disease stage IIIa, chronic atrial fibrillation, status post pacemaker congestive heart failure diastolic status post CABG, peripheral vascular disease status post right lower extremity endovascular revascularization in the past, fifth right toe ray amputation and ankle fusion in the past who was admitted by orthopedic service for cellulitis of the right foot.    Medical team was consulted for management of diabetes hypertension and his medical issues.   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  On vancomycin and Zosyn as per orthopedics.  Pain management with Percocet and Dilaudid.  History of ray amputation and ankle fusion.  Cough.    History of sinus congestion.  Continue  Flonase spray, Protonix for now.  Possible mild bronchitis.  On Zithromax into 3 days, nebulizers and inhaler.   Diabetes mellitus type 2 on insulin.  With peripheral neuropathy, retinopathy.  Continue Sliding-scale insulin, diabetic diet.  On gabapentin 3 times daily.    Will hold Farxiga for now due to underlying infection.  Last  POC glucose of 284  Chronic diastolic congestive heart failure.  Status post CABG in the past.  No chest pain.    Continue intake and output  charting, daily weights. on fluidrestriction 1500 mils per day.  2 g sodium diet.  Continue  metoprolol, aspirin, Lipitor.  Continue to hold  losartan due to increased creatinine levels.  Essential hypertension.    Blood pressure low today.  Hold metoprolol and losartan.    History of chronic atrial fibrillation. Rate controlled at this time.  On Eliquis and metoprolol at home..    Continue Eliquis.  Hold metoprolol today.  History of idiopathic pulmonary fibrosis on immunotherapy as outpatient., follows up with pulmonary as outpatient.  Patient gets 3 monthly infusions with Dr. Chase Caller.  Recently had infusion done.    Has mild cough but not needing supplemental oxygen  Sleep apnea on CPAP at home.  Resume CPAP at the hospital.  Anemia of chronic kidney disease. Monitor CBC.  Hemoglobin of 8.5.  Obesity.  Would benefit from weight loss as outpatient.  Mild acute kidney injury on chronic kidney disease stage IIIa. Baseline creatinine around 1.2-1.5.    Creatinine today at 3.3.  Hold losartan.  On gentle IV fluids.  Avoid hypotension.  Chronic hyponatremia.  Sodium of 127.  Closely monitor.  Peripheral vascular disease.  On pentoxifylline.  Continue.  Generalized weakness and deconditioning.  Will get PT evaluation once okay with orthopedics.  DVT prophylaxis: SCDs Start: 02/15/21 1703 apixaban (ELIQUIS) tablet 5 mg   Code Status: Full code  Family Communication:  None  Status is: Inpatient  Dispo: The patient is from: Home  Procedures:  None  Anti-infectives:  Marland Kitchen Vancomycin and Rocephin . Azithromycin  Anti-infectives (From admission, onward)   Start  Dose/Rate Route Frequency Ordered Stop   02/18/21 0600  vancomycin (VANCOREADY) IVPB 1250 mg/250 mL        1,250 mg 166.7 mL/hr over 90 Minutes Intravenous Every 48 hours 02/16/21 0840     02/17/21 1800  cefTRIAXone (ROCEPHIN) 2 g in sodium chloride 0.9 % 100 mL IVPB        2 g 200 mL/hr over 30 Minutes  Intravenous Every 24 hours 02/17/21 0840     02/16/21 0930  vancomycin (VANCOREADY) IVPB 1500 mg/300 mL        1,500 mg 150 mL/hr over 120 Minutes Intravenous  Once 02/16/21 0830 02/16/21 1157   02/16/21 0915  piperacillin-tazobactam (ZOSYN) IVPB 3.375 g        3.375 g 12.5 mL/hr over 240 Minutes Intravenous Every 8 hours 02/16/21 0906 02/17/21 1215   02/15/21 1900  azithromycin (ZITHROMAX) tablet 500 mg        500 mg Oral Daily 02/15/21 1809 02/17/21 0814     Subjective: Today, patient was seen and examined at bedside.  Complains of mild pain in the leg today.  Has mild cough.  Denies fever chills or rigor.  Objective: Vitals:   02/17/21 0640 02/17/21 0816  BP: (!) 113/56 (!) 90/49  Pulse: 61 90  Resp: 18 17  Temp: 98.7 F (37.1 C) 98.6 F (37 C)  SpO2: 100% 100%    Intake/Output Summary (Last 24 hours) at 02/17/2021 1341 Last data filed at 02/17/2021 1100 Gross per 24 hour  Intake 240 ml  Output 1500 ml  Net -1260 ml   Filed Weights   02/16/21 0455 02/17/21 0500  Weight: 109 kg 112.3 kg   Body mass index is 33.58 kg/m.   Physical Exam: General: Obese built, not in obvious distress HENT:   No scleral pallor or icterus noted. Oral mucosa is moist.  Chest:    Diminished breath sounds bilaterally.  Crackles from interstitial fibrosis. CVS: S1 &S2 heard. No murmur.  Previous CABG scar. Abdomen: Soft, nontender, nondistended.  Bowel sounds are heard.   Extremities: No cyanosis, clubbing, right foot wrapped with elastic compression bandage. Psych: Alert, awake and oriented, normal mood CNS:  No cranial nerve deficits.  Power equal in all extremities.   Skin:  Right foot with dressing    Data Review: I have personally reviewed the following laboratory data and studies,  CBC: Recent Labs  Lab 02/15/21 1754 02/17/21 0355  WBC 10.6* 8.8  HGB 8.4* 8.5*  HCT 26.0* 26.0*  MCV 89.0 88.4  PLT 318 332   Basic Metabolic Panel: Recent Labs  Lab 02/15/21 1754  02/15/21 1811 02/17/21 0355  NA 126*  --  127*  K 4.8  --  4.7  CL 90*  --  92*  CO2 23  --  22  GLUCOSE 191*  --  147*  BUN 64*  --  83*  CREATININE 2.66*  --  3.31*  CALCIUM 8.9  --  8.4*  MG  --  2.2 2.5*  PHOS  --  5.7* 6.4*   Liver Function Tests: Recent Labs  Lab 02/15/21 1754  AST 21  ALT 13  ALKPHOS 85  BILITOT 1.1  PROT 8.1  ALBUMIN 2.6*   No results for input(s): LIPASE, AMYLASE in the last 168 hours. No results for input(s): AMMONIA in the last 168 hours. Cardiac Enzymes: No results for input(s): CKTOTAL, CKMB, CKMBINDEX, TROPONINI in the last 168 hours. BNP (last 3 results) Recent Labs    07/17/20 1438 12/08/20  1805  BNP 210.9* 195.4*    ProBNP (last 3 results) No results for input(s): PROBNP in the last 8760 hours.  CBG: Recent Labs  Lab 02/16/21 1624 02/16/21 2015 02/17/21 0639 02/17/21 0749 02/17/21 1158  GLUCAP 117* 130* 117* 112* 162*   Recent Results (from the past 240 hour(s))  Culture, blood (routine x 2)     Status: None (Preliminary result)   Collection Time: 02/15/21  6:49 PM   Specimen: BLOOD  Result Value Ref Range Status   Specimen Description BLOOD LEFT ANTECUBITAL  Final   Special Requests   Final    BOTTLES DRAWN AEROBIC ONLY Blood Culture results may not be optimal due to an inadequate volume of blood received in culture bottles   Culture   Final    NO GROWTH 2 DAYS Performed at [facility] [address], [place], [place] [address]    Report Status PENDING  Incomplete  Culture, blood (routine x 2)     Status: None (Preliminary result)   Collection Time: 02/15/21  6:49 PM   Specimen: BLOOD LEFT ARM  Result Value Ref Range Status   Specimen Description BLOOD LEFT ARM  Final   Special Requests   Final    BOTTLES DRAWN AEROBIC ONLY Blood Culture results may not be optimal due to an inadequate volume of blood received in culture bottles   Culture   Final    NO GROWTH 2 DAYS Performed at [facility],  [place] [address], [place],  [address]    Report Status PENDING  Incomplete     Studies: No results found.    Flora Lipps, MD  [facility] 02/17/2021  If 7PM-7AM, please contact night-coverage

## 2021-02-17 NOTE — Progress Notes (Signed)
Patient ID: Shane [name], male   DOB: [birth year], 80 y.o.   MRN: [id number] Patient is seen in follow-up for the cellulitis and ischemic changes to the right foot.  The swelling continues to resolve the drainage continues to decrease.  The ischemic changes laterally are stable.  A knee-high compression sock was applied there was a slight amount of drainage from the incision dorsally.  4 x 4 gauze and Ace wrap was applied to absorb the drainage.  Discussed with the patient if there is persistent drainage tomorrow we will plan for surgical irrigation and debridement on Friday.

## 2021-02-18 ENCOUNTER — Other Ambulatory Visit: Payer: Self-pay | Admitting: Physician Assistant

## 2021-02-18 DIAGNOSIS — N1831 Chronic kidney disease, stage 3a: Secondary | ICD-10-CM | POA: Diagnosis not present

## 2021-02-18 DIAGNOSIS — I482 Chronic atrial fibrillation, unspecified: Secondary | ICD-10-CM | POA: Diagnosis not present

## 2021-02-18 DIAGNOSIS — I5032 Chronic diastolic (congestive) heart failure: Secondary | ICD-10-CM | POA: Diagnosis not present

## 2021-02-18 DIAGNOSIS — M71071 Abscess of bursa, right ankle and foot: Secondary | ICD-10-CM | POA: Diagnosis not present

## 2021-02-18 LAB — CBC
HCT: 23.9 % — ABNORMAL LOW (ref 39.0–52.0)
Hemoglobin: 7.6 g/dL — ABNORMAL LOW (ref 13.0–17.0)
MCH: 28.3 pg (ref 26.0–34.0)
MCHC: 31.8 g/dL (ref 30.0–36.0)
MCV: 88.8 fL (ref 80.0–100.0)
Platelets: 263 10*3/uL (ref 150–400)
RBC: 2.69 MIL/uL — ABNORMAL LOW (ref 4.22–5.81)
RDW: 14.6 % (ref 11.5–15.5)
WBC: 8.5 10*3/uL (ref 4.0–10.5)
nRBC: 0 % (ref 0.0–0.2)

## 2021-02-18 LAB — GLUCOSE, CAPILLARY
Glucose-Capillary: 120 mg/dL — ABNORMAL HIGH (ref 70–99)
Glucose-Capillary: 104 mg/dL — ABNORMAL HIGH (ref 70–99)
Glucose-Capillary: 107 mg/dL — ABNORMAL HIGH (ref 70–99)
Glucose-Capillary: 147 mg/dL — ABNORMAL HIGH (ref 70–99)
Glucose-Capillary: 152 mg/dL — ABNORMAL HIGH (ref 70–99)

## 2021-02-18 LAB — BASIC METABOLIC PANEL
Anion gap: 10 (ref 5–15)
BUN: 74 mg/dL — ABNORMAL HIGH (ref 8–23)
CO2: 24 mmol/L (ref 22–32)
Calcium: 8.2 mg/dL — ABNORMAL LOW (ref 8.9–10.3)
Chloride: 94 mmol/L — ABNORMAL LOW (ref 98–111)
Creatinine, Ser: 2.92 mg/dL — ABNORMAL HIGH (ref 0.61–1.24)
GFR, Estimated: 21 mL/min — ABNORMAL LOW (ref >60)
Glucose, Bld: 89 mg/dL (ref 70–99)
Potassium: 5.3 mmol/L — ABNORMAL HIGH (ref 3.5–5.1)
Sodium: 128 mmol/L — ABNORMAL LOW (ref 135–145)

## 2021-02-18 LAB — MAGNESIUM: Magnesium: 2.3 mg/dL (ref 1.7–2.4)

## 2021-02-18 LAB — PHOSPHORUS: Phosphorus: 4.6 mg/dL (ref 2.5–4.6)

## 2021-02-18 MED ORDER — ENSURE PRE-SURGERY PO LIQD
296.0000 mL | Freq: Once | ORAL | Status: AC
  Administered 2021-02-19: 296 mL via ORAL
  Filled 2021-02-18: qty 296

## 2021-02-18 MED ORDER — CEFAZOLIN SODIUM-DEXTROSE 2-4 GM/100ML-% IV SOLN
2.0000 g | INTRAVENOUS | Status: AC
  Administered 2021-02-19: 2 g via INTRAVENOUS

## 2021-02-18 NOTE — Progress Notes (Signed)
Patient ID: Shane [name], male   DOB: [birth year], 80 y.o.   MRN: [id number] Patient is status post ankle fusion and fifth ray amputation right who had cellulitis postoperatively.  With antibiotics and the compression sock the majority of the cellulitis and wound breakdown has improved.  Patient has 1 small area of a draining hematoma over the mid aspect of the anterior incision.  With there being persistent cellulitis in this 1 area in the draining hematoma will need to plan for surgical irrigation and debridement placement of antibiotic beads and plan for wound VAC placement postoperatively.  We will plan for surgery tomorrow.  Patient complains of pain posteriorly proximal to the superficial ischemic changes to the heel.  This area shows no cellulitis no signs of infection.  Pain most likely referred from the heel eschar.  Patient is floating his heels off the bed appropriately.

## 2021-02-18 NOTE — H&P (View-Only) (Signed)
Patient ID: Shane [name], male   DOB: May 19, 1941, 80 y.o.   MRN: [id number] Patient is status post ankle fusion and fifth ray amputation right who had cellulitis postoperatively.  With antibiotics and the compression sock the majority of the cellulitis and wound breakdown has improved.  Patient has 1 small area of a draining hematoma over the mid aspect of the anterior incision.  With there being persistent cellulitis in this 1 area in the draining hematoma will need to plan for surgical irrigation and debridement placement of antibiotic beads and plan for wound VAC placement postoperatively.  We will plan for surgery tomorrow.  Patient complains of pain posteriorly proximal to the superficial ischemic changes to the heel.  This area shows no cellulitis no signs of infection.  Pain most likely referred from the heel eschar.  Patient is floating his heels off the bed appropriately.

## 2021-02-18 NOTE — Progress Notes (Signed)
PROGRESS NOTE  Canon Gola VHQ:[id number] DOB: November 04, 1941 DOA: 02/15/2021 PCP: Burnard Bunting, MD   LOS: 3 days   Brief narrative: Patient is a 80 years old male with past medical history of idiopathic pulmonary fibrosis who was on immunotherapy, hypertension, sleep apnea, diabetes mellitus type 2, chronic kidney disease stage IIIa, chronic atrial fibrillation, status post pacemaker congestive heart failure diastolic status post CABG, peripheral vascular disease status post right lower extremity endovascular revascularization in the past, fifth right toe ray amputation and ankle fusion in the past who was admitted by orthopedic service for cellulitis of the right foot.    Medical team was consulted for management of diabetes hypertension and his medical issues.   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  On vancomycin and Zosyn as per orthopedics.  Pain management with Percocet and Dilaudid.  History of ray amputation and ankle fusion.  Further management as per primary team.  Patient might need further irrigation and debridement.  Cough.    History of sinus congestion.  Continue  Flonase spray, Protonix for now.  Possible mild bronchitis.  Completed Zithromax, continue ebulizers and inhaler.  Improved  Diabetes mellitus type 2 on insulin.  With peripheral neuropathy, retinopathy.  Continue Sliding-scale insulin, diabetic diet.  On gabapentin 3 times daily.    Will hold Farxiga for now due to underlying infection.  Last  POC glucose of 413  Chronic diastolic  congestive heart failure.  Status post CABG in the past.  No chest pain.    Continue intake and output charting, daily weights. on fluid restriction 1500 mls per day.  2 g sodium diet.  Continue   aspirin, Lipitor.  Continue to hold  losartan due to increased creatinine levels.  Essential hypertension.    Blood pressure borderline low.  Hold losartan.  Resume metoprolol.    History of chronic atrial fibrillation. Rate controlled at this time.  On Eliquis and metoprolol at home..   on Eliquis.  Resume metoprolol.  History of idiopathic pulmonary fibrosis on immunotherapy as outpatient., follows up with pulmonary as outpatient.  Patient gets 3 monthly infusions with Dr. Chase Caller.  Recently had infusion done.    Has mild cough but not needing supplemental oxygen  Sleep apnea on CPAP at home.  Resume CPAP at the hospital.  Anemia of chronic kidney disease. Monitor CBC.  Hemoglobin of 8.5.  Obesity.  Would benefit from weight loss as outpatient.  Mild acute kidney injury on chronic kidney disease stage IIIa. Baseline creatinine around 1.2-1.5.    Creatinine today at 2.9.  Hold losartan.  On gentle IV fluids.  Avoid hypotension.  Slightly trended down.  Patient does not have a nephrologist as outpatient.  If upgoing trend of creatinine might need nephrology evaluation.  Lab Results  Component Value Date   CREATININE 2.92 (H) 02/18/2021   CREATININE 3.31 (H) 02/17/2021   CREATININE 2.66 (H) 02/15/2021    Chronic hyponatremia.  Sodium of 127>128.  Closely monitor.  Peripheral vascular disease.  On pentoxifylline.  Continue.  Generalized weakness and deconditioning.  Will get PT evaluation once okay with orthopedics.  DVT prophylaxis: SCDs Start: 02/15/21 1703 apixaban (ELIQUIS) tablet 5 mg   Code Status: Full code  Family Communication:  None today.  Status is: Inpatient  Dispo: The patient is from: Home  Procedures:  None  Anti-infectives:  Marland Kitchen Vancomycin and  Rocephin  Anti-infectives (From admission, onward)   Start     Dose/Rate Route Frequency Ordered Stop   02/18/21 0600  vancomycin (VANCOREADY) IVPB 1250 mg/250 mL  Status:  Discontinued        1,250 mg 166.7 mL/hr over 90 Minutes Intravenous Every 48 hours 02/16/21 0840 02/17/21 1420   02/18/21 0600  vancomycin (VANCOREADY) IVPB 1000 mg/200 mL        1,000 mg 200 mL/hr over 60 Minutes Intravenous Every 48 hours 02/17/21 1420     02/17/21 1800  cefTRIAXone (ROCEPHIN) 2 g in sodium chloride 0.9 % 100 mL IVPB        2 g 200 mL/hr over 30 Minutes Intravenous Every 24 hours 02/17/21 0840     02/16/21 0930  vancomycin (VANCOREADY) IVPB 1500 mg/300 mL        1,500 mg 150 mL/hr over 120 Minutes Intravenous  Once 02/16/21 0830 02/16/21 1157   02/16/21 0915  piperacillin-tazobactam (ZOSYN) IVPB 3.375 g        3.375 g 12.5 mL/hr over 240 Minutes Intravenous Every 8 hours 02/16/21 0906 02/17/21 1215   02/15/21 1900  azithromycin (ZITHROMAX) tablet 500 mg        500 mg Oral Daily 02/15/21 1809 02/17/21 0814     Subjective: Today, patient was seen and examined at bedside.  Complains of mild pain in the leg.  Denies any fever chills or rigor.  Has mild cough but has improved.   Objective: Vitals:   02/18/21 0400 02/18/21 0845  BP: 122/67 (!) 114/58  Pulse: 75 75  Resp: 17 17  Temp: 98.2 F (36.8 C) 98.1 F (36.7 C)  SpO2: 96% 96%    Intake/Output Summary (Last 24 hours) at 02/18/2021 1041 Last data filed at 02/18/2021 0658 Gross per 24 hour  Intake 265.49 ml  Output 750 ml  Net -484.51 ml   Filed Weights   02/16/21 0455 02/17/21 0500  Weight: 109 kg 112.3 kg   Body mass index is 33.58 kg/m.   Physical Exam: General: Obese built, not in obvious distress HENT:   No scleral pallor or icterus noted. Oral mucosa is moist.  Chest:   Diminished breath sounds bilaterally.  Crackles from interstitial fibrosis CVS: S1 &S2 heard. No murmur.   Previous CABG scar. Abdomen: Soft, nontender,  nondistended.  Bowel sounds are heard.   Extremities: No cyanosis, clubbing or edema.  Right foot wrapped with elastic compression bandage. Psych: Alert, awake and oriented, normal mood CNS:  No cranial nerve deficits.  Power equal in all extremities.   Skin: Warm and dry.  Right foot dressing.   Data Review: I have personally reviewed the following laboratory data and studies,  CBC: Recent Labs  Lab 02/15/21 1754 02/17/21 0355 02/18/21 0225  WBC 10.6* 8.8 8.5  HGB 8.4* 8.5* 7.6*  HCT 26.0* 26.0* 23.9*  MCV 89.0 88.4 88.8  PLT 318 285 062   Basic Metabolic Panel: Recent Labs  Lab 02/15/21 1754 02/15/21 1811 02/17/21 0355 02/18/21 0225  NA 126*  --  127* 128*  K 4.8  --  4.7 5.3*  CL 90*  --  92* 94*  CO2 23  --  22 24  GLUCOSE 191*  --  147* 89  BUN 64*  --  83* 74*  CREATININE 2.66*  --  3.31* 2.92*  CALCIUM 8.9  --  8.4* 8.2*  MG  --  2.2 2.5* 2.3  PHOS  --  5.7* 6.4* 4.6   Liver Function Tests: Recent Labs  Lab 02/15/21 1754  AST 21  ALT 13  ALKPHOS 85  BILITOT 1.1  PROT 8.1  ALBUMIN 2.6*   No results for input(s): LIPASE, AMYLASE in the last 168 hours. No results for input(s): AMMONIA in the last 168 hours. Cardiac Enzymes: No results for input(s): CKTOTAL, CKMB, CKMBINDEX, TROPONINI in the last 168 hours. BNP (last 3 results) Recent Labs    07/17/20 1438 12/08/20 1805  BNP 210.9* 195.4*    ProBNP (last 3 results) No results for input(s): PROBNP in the last 8760 hours.  CBG: Recent Labs  Lab 02/17/21 1158 02/17/21 1630 02/17/21 1929 02/18/21 0646 02/18/21 0843  GLUCAP 162* 120* 104* 107* 147*   Recent Results (from the past 240 hour(s))  Culture, blood (routine x 2)     Status: None (Preliminary result)   Collection Time: 02/15/21  6:49 PM   Specimen: BLOOD  Result Value Ref Range Status   Specimen Description BLOOD LEFT ANTECUBITAL  Final   Special Requests   Final    BOTTLES DRAWN AEROBIC ONLY Blood Culture results may not be  optimal due to an inadequate volume of blood received in culture bottles   Culture   Final    NO GROWTH 3 DAYS Performed at [facility], [place] [address], [place], [place] [address]    Report Status PENDING  Incomplete  Culture, blood (routine x 2)     Status: None (Preliminary result)   Collection Time: 02/15/21  6:49 PM   Specimen: BLOOD LEFT ARM  Result Value Ref Range Status   Specimen Description BLOOD LEFT ARM  Final   Special Requests   Final    BOTTLES DRAWN AEROBIC ONLY Blood Culture results may not be optimal due to an inadequate volume of blood received in culture bottles   Culture   Final    NO GROWTH 3 DAYS Performed at [facility], [place] [address], [place], [place] [address]    Report Status PENDING  Incomplete     Studies: No results found.    Flora Lipps, MD  [facility] 02/18/2021  If 7PM-7AM, please contact night-coverage

## 2021-02-19 ENCOUNTER — Encounter (HOSPITAL_COMMUNITY)
Admission: AD | Disposition: A | Discharge status: Home Health | Payer: Self-pay | Source: Home / Self Care | Attending: Orthopedic Surgery

## 2021-02-19 ENCOUNTER — Inpatient Hospital Stay (HOSPITAL_COMMUNITY): Payer: Medicare Other | Admitting: Anesthesiology

## 2021-02-19 ENCOUNTER — Encounter (HOSPITAL_COMMUNITY): Payer: Self-pay | Admitting: Orthopedic Surgery

## 2021-02-19 DIAGNOSIS — T847XXA Infection and inflammatory reaction due to other internal orthopedic prosthetic devices, implants and grafts, initial encounter: Secondary | ICD-10-CM

## 2021-02-19 DIAGNOSIS — M71071 Abscess of bursa, right ankle and foot: Secondary | ICD-10-CM

## 2021-02-19 DIAGNOSIS — N1831 Chronic kidney disease, stage 3a: Secondary | ICD-10-CM | POA: Diagnosis not present

## 2021-02-19 DIAGNOSIS — I5032 Chronic diastolic (congestive) heart failure: Secondary | ICD-10-CM | POA: Diagnosis not present

## 2021-02-19 DIAGNOSIS — I482 Chronic atrial fibrillation, unspecified: Secondary | ICD-10-CM | POA: Diagnosis not present

## 2021-02-19 HISTORY — PX: I & D EXTREMITY: SHX5045

## 2021-02-19 LAB — SARS CORONAVIRUS 2 BY RT PCR (HOSPITAL ORDER, PERFORMED IN ~~LOC~~ HOSPITAL LAB): SARS Coronavirus 2: NEGATIVE

## 2021-02-19 LAB — POCT I-STAT, CHEM 8
BUN: 42 mg/dL — ABNORMAL HIGH (ref 8–23)
Calcium, Ion: 1.13 mmol/L — ABNORMAL LOW (ref 1.15–1.40)
Chloride: 97 mmol/L — ABNORMAL LOW (ref 98–111)
Creatinine, Ser: 1.6 mg/dL — ABNORMAL HIGH (ref 0.61–1.24)
Glucose, Bld: 107 mg/dL — ABNORMAL HIGH (ref 70–99)
HCT: 26 % — ABNORMAL LOW (ref 39.0–52.0)
Hemoglobin: 8.8 g/dL — ABNORMAL LOW (ref 13.0–17.0)
Potassium: 4.9 mmol/L (ref 3.5–5.1)
Sodium: 131 mmol/L — ABNORMAL LOW (ref 135–145)
TCO2: 25 mmol/L (ref 22–32)

## 2021-02-19 LAB — GLUCOSE, CAPILLARY
Glucose-Capillary: 133 mg/dL — ABNORMAL HIGH (ref 70–99)
Glucose-Capillary: 143 mg/dL — ABNORMAL HIGH (ref 70–99)
Glucose-Capillary: 160 mg/dL — ABNORMAL HIGH (ref 70–99)
Glucose-Capillary: 121 mg/dL — ABNORMAL HIGH (ref 70–99)
Glucose-Capillary: 136 mg/dL — ABNORMAL HIGH (ref 70–99)
Glucose-Capillary: 110 mg/dL — ABNORMAL HIGH (ref 70–99)
Glucose-Capillary: 111 mg/dL — ABNORMAL HIGH (ref 70–99)
Glucose-Capillary: 207 mg/dL — ABNORMAL HIGH (ref 70–99)

## 2021-02-19 LAB — CBC
HCT: 23.3 % — ABNORMAL LOW (ref 39.0–52.0)
Hemoglobin: 7.6 g/dL — ABNORMAL LOW (ref 13.0–17.0)
MCH: 29 pg (ref 26.0–34.0)
MCHC: 32.6 g/dL (ref 30.0–36.0)
MCV: 88.9 fL (ref 80.0–100.0)
Platelets: 233 10*3/uL (ref 150–400)
RBC: 2.62 MIL/uL — ABNORMAL LOW (ref 4.22–5.81)
RDW: 14.6 % (ref 11.5–15.5)
WBC: 7.8 10*3/uL (ref 4.0–10.5)
nRBC: 0 % (ref 0.0–0.2)

## 2021-02-19 LAB — BASIC METABOLIC PANEL
Anion gap: 8 (ref 5–15)
BUN: 53 mg/dL — ABNORMAL HIGH (ref 8–23)
CO2: 23 mmol/L (ref 22–32)
Calcium: 8.2 mg/dL — ABNORMAL LOW (ref 8.9–10.3)
Chloride: 95 mmol/L — ABNORMAL LOW (ref 98–111)
Creatinine, Ser: 1.92 mg/dL — ABNORMAL HIGH (ref 0.61–1.24)
GFR, Estimated: 35 mL/min — ABNORMAL LOW (ref >60)
Glucose, Bld: 185 mg/dL — ABNORMAL HIGH (ref 70–99)
Potassium: 5.4 mmol/L — ABNORMAL HIGH (ref 3.5–5.1)
Sodium: 126 mmol/L — ABNORMAL LOW (ref 135–145)

## 2021-02-19 LAB — PREPARE RBC (CROSSMATCH)

## 2021-02-19 LAB — MAGNESIUM: Magnesium: 2.2 mg/dL (ref 1.7–2.4)

## 2021-02-19 SURGERY — IRRIGATION AND DEBRIDEMENT EXTREMITY
Anesthesia: General | Site: Ankle | Laterality: Right

## 2021-02-19 MED ORDER — METOPROLOL TARTRATE 25 MG PO TABS
25.0000 mg | ORAL_TABLET | Freq: Two times a day (BID) | ORAL | Status: DC
  Administered 2021-02-20 – 2021-02-21 (×3): 25 mg via ORAL
  Filled 2021-02-19 (×3): qty 1

## 2021-02-19 MED ORDER — CHLORHEXIDINE GLUCONATE 0.12 % MT SOLN
OROMUCOSAL | Status: AC
  Administered 2021-02-19: 15 mL via OROMUCOSAL
  Filled 2021-02-19: qty 15

## 2021-02-19 MED ORDER — METOCLOPRAMIDE HCL 5 MG/ML IJ SOLN: 5.0000 mg | Freq: Three times a day (TID) | INTRAMUSCULAR | Status: DC | PRN

## 2021-02-19 MED ORDER — METOCLOPRAMIDE HCL 5 MG PO TABS
5.0000 mg | ORAL_TABLET | Freq: Three times a day (TID) | ORAL | Status: DC | PRN
  Administered 2021-02-22: 5 mg via ORAL
  Filled 2021-02-19: qty 1

## 2021-02-19 MED ORDER — POLYETHYLENE GLYCOL 3350 17 G PO PACK
17.0000 g | PACK | Freq: Every day | ORAL | Status: DC | PRN
  Administered 2021-02-19: 17 g via ORAL
  Filled 2021-02-19: qty 1

## 2021-02-19 MED ORDER — VANCOMYCIN HCL 500 MG IV SOLR
INTRAVENOUS | Status: DC | PRN
  Administered 2021-02-19: 500 mg

## 2021-02-19 MED ORDER — FENTANYL CITRATE (PF) 100 MCG/2ML IJ SOLN
25.0000 ug | INTRAMUSCULAR | Status: DC | PRN
Start: 2021-02-19 — End: 2021-02-19
  Administered 2021-02-19 (×2): 50 ug via INTRAVENOUS

## 2021-02-19 MED ORDER — FENTANYL CITRATE (PF) 250 MCG/5ML IJ SOLN
INTRAMUSCULAR | Status: DC | PRN
  Administered 2021-02-19: 50 ug via INTRAVENOUS

## 2021-02-19 MED ORDER — PROPOFOL 10 MG/ML IV BOLUS
INTRAVENOUS | Status: DC | PRN
  Administered 2021-02-19: 100 mg via INTRAVENOUS

## 2021-02-19 MED ORDER — CHLORHEXIDINE GLUCONATE 0.12 % MT SOLN: 15.0000 mL | OROMUCOSAL | Status: AC

## 2021-02-19 MED ORDER — VANCOMYCIN HCL 1500 MG/300ML IV SOLN
1500.0000 mg | INTRAVENOUS | Status: DC
  Administered 2021-02-19 – 2021-02-21 (×2): 1500 mg via INTRAVENOUS
  Filled 2021-02-19 (×2): qty 300

## 2021-02-19 MED ORDER — MIDAZOLAM HCL 2 MG/2ML IJ SOLN
INTRAMUSCULAR | Status: AC
  Filled 2021-02-19: qty 2

## 2021-02-19 MED ORDER — FENTANYL CITRATE (PF) 100 MCG/2ML IJ SOLN
INTRAMUSCULAR | Status: AC
  Filled 2021-02-19: qty 2

## 2021-02-19 MED ORDER — LIDOCAINE 2% (20 MG/ML) 5 ML SYRINGE
INTRAMUSCULAR | Status: DC | PRN
  Administered 2021-02-19: 100 mg via INTRAVENOUS

## 2021-02-19 MED ORDER — EPHEDRINE SULFATE-NACL 50-0.9 MG/10ML-% IV SOSY
PREFILLED_SYRINGE | INTRAVENOUS | Status: DC | PRN
  Administered 2021-02-19: 5 mg via INTRAVENOUS

## 2021-02-19 MED ORDER — GENTAMICIN SULFATE 40 MG/ML IJ SOLN
INTRAMUSCULAR | Status: DC | PRN
  Administered 2021-02-19: 120 mg

## 2021-02-19 MED ORDER — CEFAZOLIN SODIUM-DEXTROSE 2-4 GM/100ML-% IV SOLN
INTRAVENOUS | Status: AC
  Filled 2021-02-19: qty 100

## 2021-02-19 MED ORDER — AMISULPRIDE (ANTIEMETIC) 5 MG/2ML IV SOLN: 10.0000 mg | Freq: Once | INTRAVENOUS | Status: DC | PRN

## 2021-02-19 MED ORDER — VANCOMYCIN HCL 500 MG IV SOLR
INTRAVENOUS | Status: AC
  Filled 2021-02-19: qty 500

## 2021-02-19 MED ORDER — OXYCODONE HCL 5 MG PO TABS
5.0000 mg | ORAL_TABLET | Freq: Once | ORAL | Status: DC | PRN
Start: 2021-02-19 — End: 2021-02-19

## 2021-02-19 MED ORDER — OXYCODONE HCL 5 MG/5ML PO SOLN
5.0000 mg | Freq: Once | ORAL | Status: DC | PRN
Start: 2021-02-19 — End: 2021-02-19

## 2021-02-19 MED ORDER — DEXAMETHASONE SODIUM PHOSPHATE 10 MG/ML IJ SOLN
INTRAMUSCULAR | Status: DC | PRN
  Administered 2021-02-19: 5 mg via INTRAVENOUS

## 2021-02-19 MED ORDER — GENTAMICIN SULFATE 40 MG/ML IJ SOLN
INTRAMUSCULAR | Status: AC
  Filled 2021-02-19: qty 4

## 2021-02-19 MED ORDER — SODIUM CHLORIDE 0.9 % IV SOLN: INTRAVENOUS | Status: DC

## 2021-02-19 MED ORDER — LACTATED RINGERS IV SOLN: INTRAVENOUS | Status: DC

## 2021-02-19 MED ORDER — SODIUM CHLORIDE 0.9% IV SOLUTION: Freq: Once | INTRAVENOUS | Status: AC

## 2021-02-19 MED ORDER — ONDANSETRON HCL 4 MG/2ML IJ SOLN
INTRAMUSCULAR | Status: DC | PRN
  Administered 2021-02-19: 4 mg via INTRAVENOUS

## 2021-02-19 MED ORDER — FENTANYL CITRATE (PF) 250 MCG/5ML IJ SOLN
INTRAMUSCULAR | Status: AC
  Filled 2021-02-19: qty 5

## 2021-02-19 MED ORDER — ONDANSETRON HCL 4 MG/2ML IJ SOLN: 4.0000 mg | Freq: Once | INTRAMUSCULAR | Status: DC | PRN

## 2021-02-19 SURGICAL SUPPLY — 35 items
BLADE SURG 21 STRL SS (BLADE) ×2 IMPLANT
BNDG COHESIVE 6X5 TAN STRL LF (GAUZE/BANDAGES/DRESSINGS) IMPLANT
BNDG GAUZE ELAST 4 BULKY (GAUZE/BANDAGES/DRESSINGS) ×4 IMPLANT
CANISTER WOUNDNEG PRESSURE 500 (CANNISTER) ×1 IMPLANT
COVER SURGICAL LIGHT HANDLE (MISCELLANEOUS) ×4 IMPLANT
COVER WAND RF STERILE (DRAPES) IMPLANT
DRAPE DERMATAC (DRAPES) ×4 IMPLANT
DRAPE U-SHAPE 47X51 STRL (DRAPES) ×2 IMPLANT
DRSG ADAPTIC 3X8 NADH LF (GAUZE/BANDAGES/DRESSINGS) ×1 IMPLANT
DURAPREP 26ML APPLICATOR (WOUND CARE) ×2 IMPLANT
ELECT REM PT RETURN 9FT ADLT (ELECTROSURGICAL)
ELECTRODE REM PT RTRN 9FT ADLT (ELECTROSURGICAL) IMPLANT
GAUZE SPONGE 4X4 12PLY STRL (GAUZE/BANDAGES/DRESSINGS) ×2 IMPLANT
GLOVE BIOGEL PI IND STRL 9 (GLOVE) ×1 IMPLANT
GLOVE BIOGEL PI INDICATOR 9 (GLOVE) ×1
GLOVE SURG ORTHO 9.0 STRL STRW (GLOVE) ×2 IMPLANT
GOWN STRL REUS W/ TWL XL LVL3 (GOWN DISPOSABLE) ×2 IMPLANT
GOWN STRL REUS W/TWL XL LVL3 (GOWN DISPOSABLE) ×4
HANDPIECE INTERPULSE COAX TIP (DISPOSABLE)
KIT BASIN OR (CUSTOM PROCEDURE TRAY) ×2 IMPLANT
KIT STIMULAN 5CC (Orthopedic Implant) ×1 IMPLANT
KIT TURNOVER KIT B (KITS) ×2 IMPLANT
MANIFOLD NEPTUNE II (INSTRUMENTS) ×2 IMPLANT
NS IRRIG 1000ML POUR BTL (IV SOLUTION) ×3 IMPLANT
PACK ORTHO EXTREMITY (CUSTOM PROCEDURE TRAY) ×2 IMPLANT
PAD ARMBOARD 7.5X6 YLW CONV (MISCELLANEOUS) ×4 IMPLANT
PREVENA RESTOR AXIOFORM 29X28 (GAUZE/BANDAGES/DRESSINGS) ×1 IMPLANT
SET HNDPC FAN SPRY TIP SCT (DISPOSABLE) IMPLANT
STOCKINETTE IMPERVIOUS 9X36 MD (GAUZE/BANDAGES/DRESSINGS) IMPLANT
SUT ETHILON 2 0 PSLX (SUTURE) ×4 IMPLANT
SWAB COLLECTION DEVICE MRSA (MISCELLANEOUS) ×1 IMPLANT
SWAB CULTURE ESWAB REG 1ML (MISCELLANEOUS) IMPLANT
TOWEL GREEN STERILE (TOWEL DISPOSABLE) ×2 IMPLANT
TUBE CONNECTING 12X1/4 (SUCTIONS) ×2 IMPLANT
YANKAUER SUCT BULB TIP NO VENT (SUCTIONS) ×2 IMPLANT

## 2021-02-19 NOTE — Progress Notes (Signed)
Pharmacy Antibiotic Note  Shane [name] is a 80 y.o. male admitted on 02/15/2021 with wound infection.  Pharmacy has been consulted for Vancomycin dosing.  ID: (Immunosuppressed) RLE cellulitis/drainage. Afebrile. WBC down WNL. - 3/18 I&D + place abx beads.  3/18 calculation: Vancomycin 1500 mg IV Q 36 hrs. Goal AUC 400-550. Expected AUC: 474 SCr used: 1.82  CTX 3/16>> Vanc 3/15 >>  Piptazo 3/15 >>3/16 Azith 3/14    3/14 BCx: pend  2/18 MRSA PCR: neg 1/28 RLE wound: Serratia (S- CTX), E faecalis   Plan: Vancomycin give another 1500mg  tonight Rapidly changing renal function. Needs daily recalculation    Weight: 112.3 kg (247 lb 9.2 oz)  Temp (24hrs), Avg:98 F (36.7 C), Min:97.6 F (36.4 C), Max:98.4 F (36.9 C)  Recent Labs  Lab 02/15/21 1754 02/17/21 0355 02/18/21 0225 02/19/21 0406  WBC 10.6* 8.8 8.5 7.8  CREATININE 2.66* 3.31* 2.92* 1.92*    Estimated Creatinine Clearance: 40.4 mL/min (A) (by C-G formula based on SCr of 1.92 mg/dL (H)).    Allergies  Allergen Reactions  . Codeine Hives and Itching  . Ofev [Nintedanib] Diarrhea    SEVERE DIARRHEA  . Other Diarrhea and Other (See Comments)    Esbriet causes elevated LFTs. D/C on 06/14/17 and SEVERE DIARRHEA    Shane [name] S. Shane [name], PharmD, BCPS Clinical Staff Pharmacist [email or web address]  Shane [name] 02/19/2021 10:08 AM

## 2021-02-19 NOTE — Plan of Care (Signed)

## 2021-02-19 NOTE — Addendum Note (Signed)
Addendum  created 02/19/21 1726 by Lynda Rainwater, MD   Attestation recorded in [place], [place] filed

## 2021-02-19 NOTE — Progress Notes (Signed)
Started 1unit PRBC transfusion, vital signs taken and recorded, no reactions noted, will continue to monitor.

## 2021-02-19 NOTE — Anesthesia Preprocedure Evaluation (Signed)
Anesthesia Evaluation  Patient identified by MRN, date of birth, ID band Patient awake    Reviewed: Allergy & Precautions, NPO status , Patient's Chart, lab work & pertinent test results  History of Anesthesia Complications (+) PONVNegative for: history of anesthetic complications  Airway Mallampati: III  TM Distance: >3 FB Neck ROM: Full    Dental  (+) Teeth Intact   Pulmonary shortness of breath (pulm fibrosis), sleep apnea and Continuous Positive Airway Pressure Ventilation ,    Pulmonary exam normal        Cardiovascular hypertension, + CAD, + CABG, + Peripheral Vascular Disease and +CHF  Normal cardiovascular exam+ dysrhythmias Atrial Fibrillation + Valvular Problems/Murmurs AS      Neuro/Psych negative neurological ROS  negative psych ROS   GI/Hepatic Neg liver ROS, GERD  ,  Endo/Other  diabetes, Type 2, Insulin Dependent  Renal/GU Renal InsufficiencyRenal disease  negative genitourinary   Musculoskeletal negative musculoskeletal ROS (+)   Abdominal   Peds  Hematology  (+) anemia ,   Anesthesia Other Findings   Reproductive/Obstetrics                         Anesthesia Physical Anesthesia Plan  ASA: IV  Anesthesia Plan: General   Post-op Pain Management:    Induction: Intravenous  PONV Risk Score and Plan: 3 and Ondansetron, Dexamethasone, Midazolam and Treatment may vary due to age or medical condition  Airway Management Planned: LMA  Additional Equipment: None  Intra-op Plan:   Post-operative Plan: Extubation in OR  Informed Consent: I have reviewed the patients History and Physical, chart, labs and discussed the procedure including the risks, benefits and alternatives for the proposed anesthesia with the patient or authorized representative who has indicated his/her understanding and acceptance.     Dental advisory given  Plan Discussed with:   Anesthesia Plan  Comments:         Anesthesia Quick Evaluation

## 2021-02-19 NOTE — Progress Notes (Signed)
PROGRESS NOTE  Shane [name] EXN:[id number] DOB: Sep 04, 1941 DOA: 02/15/2021 PCP: Burnard Bunting, MD   LOS: 4 days   Brief narrative: Patient is a 80 years old male with past medical history of idiopathic pulmonary fibrosis who was on immunotherapy, hypertension, sleep apnea, diabetes mellitus type 2, chronic kidney disease stage IIIa, chronic atrial fibrillation, status post pacemaker congestive heart failure diastolic status post CABG, peripheral vascular disease status post right lower extremity endovascular revascularization in the past, fifth right toe ray amputation and ankle fusion in the past who was admitted by orthopedic service for cellulitis of the right foot.    Medical team was consulted for management of diabetes hypertension and his medical issues.  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  On vancomycin and Zosyn as per orthopedics.  Pain management with Percocet and Dilaudid.  History of ray amputation and ankle fusion.  For ankle debridement and placement of antibiotic beads today.   Cough.    History of sinus congestion.  Continue  Flonase spray, Protonix for now.  Completed Zithromax for acute bronchitis., continue nebulizers and inhaler.   Diabetes mellitus type 2 on insulin.  With peripheral neuropathy, retinopathy.  Continue Sliding-scale insulin.    Will hold Farxiga for now due to underlying infection.  Last  POC glucose of 143.   Chronic diastolic congestive heart failure.  Status post CABG in the  past.    Continue intake and output charting, daily weights. on fluid restriction 1500 mls per day.  2 g sodium diet.  Continue   aspirin, Lipitor.  Continue to hold  losartan due to increased creatinine levels.   Essential hypertension.    Blood pressure borderline low.metoprolol and losartan on hold.  Resume metoprolol from a.m.   History of chronic atrial fibrillation. Rate controlled at this time.  On Eliquis and metoprolol at home. on Eliquis.  Resume metoprolol from a.m.  History of idiopathic pulmonary fibrosis on immunotherapy as outpatient., follows up with pulmonary as outpatient.  Patient gets 3 monthly infusions with Dr. Chase Caller.  Recently had infusion done.  Not on supplemental oxygen at home.   Sleep apnea on CPAP at home.     Anemia of chronic kidney disease. Monitor CBC.  Hemoglobin of 8.5.   Obesity.  Would benefit from weight loss as outpatient.   Mild acute kidney injury on chronic kidney disease stage IIIa. Baseline creatinine around 1.2-1.5.    Creatinine today at 1.9.  Continue to hold losartan.  Continue IV fluids. Patient does not have a nephrologist as outpatient.  If upgoing trend of creatinine might need nephrology evaluation or at least nephrology follow-up as outpatient  Lab Results  Component Value Date   CREATININE 1.92 (H) 02/19/2021   CREATININE 2.92 (H) 02/18/2021   CREATININE 3.31 (H) 02/17/2021    Chronic hyponatremia.  Sodium of 127>128>126.  Closely monitor.  Asymptomatic at this time.  Borderline hyperkalemia.  Potassium 5.4.  Will closely monitor.  Avoid potassium supplements.   Peripheral vascular disease.  On pentoxifylline.  Continue.  Generalized weakness and deconditioning.  Will get PT evaluation once okay with orthopedics.  DVT prophylaxis: SCDs Start: 02/15/21 1703 apixaban (ELIQUIS) tablet 5 mg   Code Status: Full code  Family Communication:  None today.  Status is: Inpatient  Dispo: The patient is from:  Home  Procedures:  None  Anti-infectives:  Marland Kitchen Vancomycin and Rocephin  Anti-infectives (From admission, onward)    Start     Dose/Rate Route Frequency Ordered Stop   02/19/21 1800  vancomycin (VANCOREADY) IVPB 1500 mg/300 mL        1,500 mg 150 mL/hr over 120 Minutes Intravenous Every 36 hours 02/19/21 1008     02/19/21 1300  ceFAZolin (ANCEF) IVPB 2g/100 mL premix        2 g 200 mL/hr over 30 Minutes Intravenous On call to O.R. 02/18/21 2341 02/20/21 0559   02/18/21 0600  vancomycin (VANCOREADY) IVPB 1250 mg/250 mL  Status:  Discontinued        1,250 mg 166.7 mL/hr over 90 Minutes Intravenous Every 48 hours 02/16/21 0840 02/17/21 1420   02/18/21 0600  vancomycin (VANCOREADY) IVPB 1000 mg/200 mL  Status:  Discontinued        1,000 mg 200 mL/hr over 60 Minutes Intravenous Every 48 hours 02/17/21 1420 02/19/21 1007   02/17/21 1800  cefTRIAXone (ROCEPHIN) 2 g in sodium chloride 0.9 % 100 mL IVPB        2 g 200 mL/hr over 30 Minutes Intravenous Every 24 hours 02/17/21 0840     02/16/21 0930  vancomycin (VANCOREADY) IVPB 1500 mg/300 mL        1,500 mg 150 mL/hr over 120 Minutes Intravenous  Once 02/16/21 0830 02/16/21 1157   02/16/21 0915  piperacillin-tazobactam (ZOSYN) IVPB 3.375 g        3.375 g 12.5 mL/hr over 240 Minutes Intravenous Every 8 hours 02/16/21 0906 02/17/21 1215   02/15/21 1900  azithromycin (ZITHROMAX) tablet 500 mg        500 mg Oral Daily 02/15/21 1809 02/17/21 0814      Subjective: Today, patient was seen and examined at bedside.  Denies any nausea, vomiting fever chills.  Awaiting for surgical intervention.    Objective: Vitals:   02/19/21 1051 02/19/21 1116  BP: 118/63 129/71  Pulse: 67 76  Resp: 16 16  Temp: 98.3 F (36.8 C) 98.3 F (36.8 C)  SpO2: 100% 98%    Intake/Output Summary (Last 24 hours) at 02/19/2021 1210 Last data filed at 02/19/2021 0100 Gross per 24 hour  Intake -  Output 200 ml  Net -200 ml   Filed Weights   02/16/21 0455  02/17/21 0500  Weight: 109 kg 112.3 kg   Body mass index is 33.58 kg/m.   Physical Exam: General: Obese built, not in obvious distress HENT:   No scleral pallor or icterus noted. Oral mucosa is moist.  Chest:   Diminished breath sounds bilaterally.  Crackles bilaterally from interstitial fibrosis CVS: S1 &S2 heard. No murmur.  Regular rate and rhythm.  Previous CABG scar Abdomen: Soft, nontender, nondistended.  Bowel sounds are heard.   Extremities: No cyanosis, clubbing or edema..  Right foot wrapped with elastic compression bandage Psych: Alert, awake and oriented, normal mood CNS:  No cranial nerve deficits.  Power equal in all extremities.   Skin: Warm and dry.  Right foot dressing.  Data Review: I have personally reviewed the following laboratory data and studies,  CBC: Recent Labs  Lab 02/15/21 1754 02/17/21 0355 02/18/21 0225 02/19/21  0406  WBC 10.6* 8.8 8.5 7.8  HGB 8.4* 8.5* 7.6* 7.6*  HCT 26.0* 26.0* 23.9* 23.3*  MCV 89.0 88.4 88.8 88.9  PLT 318 285 263 631   Basic Metabolic Panel: Recent Labs  Lab 02/15/21 1754 02/15/21 1811 02/17/21 0355 02/18/21 0225 02/19/21 0406  NA 126*  --  127* 128* 126*  K 4.8  --  4.7 5.3* 5.4*  CL 90*  --  92* 94* 95*  CO2 23  --  22 24 23   GLUCOSE 191*  --  147* 89 185*  BUN 64*  --  83* 74* 53*  CREATININE 2.66*  --  3.31* 2.92* 1.92*  CALCIUM 8.9  --  8.4* 8.2* 8.2*  MG  --  2.2 2.5* 2.3 2.2  PHOS  --  5.7* 6.4* 4.6  --    Liver Function Tests: Recent Labs  Lab 02/15/21 1754  AST 21  ALT 13  ALKPHOS 85  BILITOT 1.1  PROT 8.1  ALBUMIN 2.6*   No results for input(s): LIPASE, AMYLASE in the last 168 hours. No results for input(s): AMMONIA in the last 168 hours. Cardiac Enzymes: No results for input(s): CKTOTAL, CKMB, CKMBINDEX, TROPONINI in the last 168 hours. BNP (last 3 results) Recent Labs    07/17/20 1438 12/08/20 1805  BNP 210.9* 195.4*    ProBNP (last 3 results) No results for input(s): PROBNP in  the last 8760 hours.  CBG: Recent Labs  Lab 02/18/21 0843 02/18/21 1221 02/18/21 1632 02/18/21 1933 02/19/21 0816  GLUCAP 147* 152* 133* 143* 160*   Recent Results (from the past 240 hour(s))  Culture, blood (routine x 2)     Status: None (Preliminary result)   Collection Time: 02/15/21  6:49 PM   Specimen: BLOOD  Result Value Ref Range Status   Specimen Description BLOOD LEFT ANTECUBITAL  Final   Special Requests   Final    BOTTLES DRAWN AEROBIC ONLY Blood Culture results may not be optimal due to an inadequate volume of blood received in culture bottles   Culture   Final    NO GROWTH 4 DAYS Performed at [facility], [place] [address], [place], [place] [address]    Report Status PENDING  Incomplete  Culture, blood (routine x 2)     Status: None (Preliminary result)   Collection Time: 02/15/21  6:49 PM   Specimen: BLOOD LEFT ARM  Result Value Ref Range Status   Specimen Description BLOOD LEFT ARM  Final   Special Requests   Final    BOTTLES DRAWN AEROBIC ONLY Blood Culture results may not be optimal due to an inadequate volume of blood received in culture bottles   Culture   Final    NO GROWTH 4 DAYS Performed at [facility], [place] [address] [place], [place] [address]    Report Status PENDING  Incomplete  SARS Coronavirus 2 by RT PCR (hospital order, performed in [facility] hospital lab) Nasopharyngeal Nasopharyngeal Swab     Status: None   Collection Time: 02/19/21  8:15 AM   Specimen: Nasopharyngeal Swab  Result Value Ref Range Status   SARS Coronavirus 2 NEGATIVE NEGATIVE Final    Comment: (NOTE) SARS-CoV-2 target nucleic acids are NOT DETECTED.  The SARS-CoV-2 RNA is generally detectable in upper and lower respiratory specimens during the acute phase of infection. The lowest concentration of SARS-CoV-2 viral copies this assay can detect is 250 copies / mL. A negative result does not preclude SARS-CoV-2 infection and should  not be used as the sole basis  for treatment or other patient management decisions.  A negative result may occur with improper specimen collection / handling, submission of specimen other than nasopharyngeal swab, presence of viral mutation(s) within the areas targeted by this assay, and inadequate number of viral copies (<250 copies / mL). A negative result must be combined with clinical observations, patient history, and epidemiological information.  Fact Sheet for Patients:   StrictlyIdeas.no  Fact Sheet for Healthcare Providers: BankingDealers.co.za  This test is not yet approved or  cleared by the [place] FDA and has been authorized for detection and/or diagnosis of SARS-CoV-2 by FDA under an Emergency Use Authorization (EUA).  This EUA will remain in effect (meaning this test can be used) for the duration of the COVID-19 declaration under Section 564(b)(1) of the Act, 21 U.S.C. section 360bbb-3(b)(1), unless the authorization is terminated or revoked sooner.  Performed at [facility], [place] [address], [place], [place] [address]      Studies: No results found.    Flora Lipps, MD  [facility] 02/19/2021  If 7PM-7AM, please contact night-coverage

## 2021-02-19 NOTE — Op Note (Signed)
02/15/2021 - 02/19/2021  3:23 PM  PATIENT:  Shane [name]    PRE-OPERATIVE DIAGNOSIS:  Abscess Right Ankle, Infected Hardware  POST-OPERATIVE DIAGNOSIS:  Same  PROCEDURE:  RIGHT ANKLE DEBRIDEMENT AND PLACEMENT ANTIBIOTIC BEADS Local tissue rearrangement for wound closure 11 x 3 cm. Application of Prevena wound VAC. Removal of deep retained hardware.  SURGEON:  [name], MD  PHYSICIAN ASSISTANT:None ANESTHESIA:   General  PREOPERATIVE INDICATIONS:  Shane [name] is a  80 y.o. male with a diagnosis of Abscess Right Ankle, Infected Hardware who failed conservative measures and elected for surgical management.    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  Developed a infection he was started on IV antibiotics cellulitis resolved but there was still drainage and patient presents at this time for debridement of the wound removal of the hardware and placement of antibiotic beads.  The risks benefits and alternatives were discussed with the patient preoperatively including but not limited to the risks of infection, bleeding, nerve injury, cardiopulmonary complications, the need for revision surgery, among others, and the patient was willing to proceed.  OPERATIVE IMPLANTS: Antibiotic stimulant beads 5 cc with 1 g vancomycin and 160 mg gentamicin  @ENCIMAGES @  OPERATIVE FINDINGS: Infection has extended down to the hardware.  Tissue was sent for cultures for long-term antibiotics.  OPERATIVE PROCEDURE: Patient was brought to the operating room and underwent a general anesthetic.  After adequate levels anesthesia were obtained patient's right lower extremity was prepped using DuraPrep draped into a sterile field a timeout was called the surgical incision was ellipsed out back to healthy viable margins.  There was infected tissue, skin soft tissue muscle  fascia and bone was excised.  The infection extended down to the hardware.  The tissue was sent for cultures.  The hardware was removed both the plate and screws one intramedullary screw was left in place to provide stabilization of the fusion.  The wound was irrigated with normal saline 3 L.  Antibiotic beads were placed stimulant beads 5 cc with 1 g vancomycin and 160 mg gentamicin.  Local tissue rearrangement was used to close the wound 11 x 3 cm.  Wound was closed using 2-0 nylon.  The Praveena axial form wound VAC was applied this had a good suction fit.  Patient was extubated taken the PACU in stable condition.  Debridement type: Excisional Debridement  Side: right  Body Location: Right ankle  Tools used for debridement: scalpel and rongeur  Pre-debridement Wound size (cm):   Length: 0    Width: 0     Depth: 0   Post-debridement Wound size (cm):   Length: 11        Width: 3     Depth: 2   Debridement depth beyond dead/damaged tissue down to healthy viable tissue: yes  Tissue layer involved: skin, subcutaneous tissue, muscle / fascia, bone  Nature of tissue removed: Slough, Necrotic, Devitalized Tissue, Non-viable tissue and Purulence  Irrigation volume: 3 liters     Irrigation fluid type: Normal Saline        DISCHARGE PLANNING:  Antibiotic duration: Continue IV antibiotics  Weightbearing: Touchdown weightbearing on the right  Pain medication: Opioid pathway  Dressing care/ Wound VAC: Incisional wound VAC  Ambulatory devices: Walker  Discharge to: Anticipate discharge to home  Follow-up: In the office 1 week post operative.

## 2021-02-19 NOTE — Anesthesia Procedure Notes (Signed)
Procedure Name: LMA Insertion Date/Time: 02/19/2021 2:50 PM Performed by: Darletta Moll, CRNA Pre-anesthesia Checklist: Patient identified, Emergency Drugs available, Suction available and Patient being monitored Patient Re-evaluated:Patient Re-evaluated prior to induction Oxygen Delivery Method: Circle system utilized Preoxygenation: Pre-oxygenation with 100% oxygen Induction Type: IV induction Ventilation: Mask ventilation without difficulty LMA: LMA inserted LMA Size: 5.0 Tube type: Oral Number of attempts: 1 Airway Equipment and Method: Stylet and Oral airway Placement Confirmation: positive ETCO2,  breath sounds checked- equal and bilateral and CO2 detector Tube secured with: Tape Dental Injury: Teeth and Oropharynx as per pre-operative assessment

## 2021-02-19 NOTE — Anesthesia Postprocedure Evaluation (Signed)
Anesthesia Post Note  Patient: Shane [name]  Procedure(s) Performed: RIGHT ANKLE DEBRIDEMENT AND PLACEMENT ANTIBIOTIC BEADS (Right Ankle)     Patient location during evaluation: PACU Anesthesia Type: General Level of consciousness: awake and alert Pain management: pain level controlled Vital Signs Assessment: post-procedure vital signs reviewed and stable Respiratory status: spontaneous breathing, nonlabored ventilation, respiratory function stable and patient connected to nasal cannula oxygen Cardiovascular status: blood pressure returned to baseline and stable Postop Assessment: no apparent nausea or vomiting Anesthetic complications: no   No complications documented.  Last Vitals:  Vitals:   02/19/21 1552 02/19/21 1601  BP: (!) 131/59   Pulse: (!) 55 (!) 58  Resp: 13 15  Temp:    SpO2: 96% 97%    Last Pain:  Vitals:   02/19/21 1601  TempSrc:   PainSc: 8                  Renardo Cheatum E Andren Bethea

## 2021-02-19 NOTE — Interval H&P Note (Signed)
History and Physical Interval Note:  02/19/2021 6:48 AM  Shane [name]  has presented today for surgery, with the diagnosis of Abscess Right Ankle, Infected Hardware.  The various methods of treatment have been discussed with the patient and family. After consideration of risks, benefits and other options for treatment, the patient has consented to  Procedure(s): RIGHT ANKLE DEBRIDEMENT AND PLACEMENT ANTIBIOTIC BEADS (Right) as a surgical intervention.  The patient's history has been reviewed, patient examined, no change in status, stable for surgery.  I have reviewed the patient's chart and labs.  Questions were answered to the patient's satisfaction.     [name]

## 2021-02-19 NOTE — Addendum Note (Signed)
Addendum  created 02/19/21 1725 by Lidia Collum, MD   Intraprocedure Staff edited

## 2021-02-19 NOTE — Care Management Important Message (Signed)
Important Message  Patient Details  Name: Shane [name] MRN: [id number] Date of Birth: 16-Sep-1941   Medicare Important Message Given:  Yes - Important Message mailed due to current National Emergency   Verbal consent obtained due to current National Emergency  Relationship to patient: Self Contact Name: Terrill Alperin Call Date: 02/19/21  Time: 1028 Phone: [phone] Outcome: No Answer/Busy Important Message mailed to: Patient address on file    [name] 02/19/2021, 10:28 AM

## 2021-02-19 NOTE — Transfer of Care (Signed)
Immediate Anesthesia Transfer of Care Note  Patient: Candise Bowens Peddy  Procedure(s) Performed: RIGHT ANKLE DEBRIDEMENT AND PLACEMENT ANTIBIOTIC BEADS (Right Ankle)  Patient Location: PACU  Anesthesia Type:General  Level of Consciousness: drowsy and patient cooperative  Airway & Oxygen Therapy: Patient Spontanous Breathing  Post-op Assessment: Report given to RN, Post -op Vital signs reviewed and stable and Patient moving all extremities X 4  Post vital signs: Reviewed and stable  Last Vitals:  Vitals Value Taken Time  BP 117/62 02/19/21 1538  Temp    Pulse 81 02/19/21 1540  Resp 14 02/19/21 1540  SpO2 92 % 02/19/21 1540  Vitals shown include unvalidated device data.  Last Pain:  Vitals:   02/19/21 1333  TempSrc: Oral  PainSc:       Patients Stated Pain Goal: 0 (61/22/44 9753)  Complications: No complications documented.

## 2021-02-20 DIAGNOSIS — N1831 Chronic kidney disease, stage 3a: Secondary | ICD-10-CM | POA: Diagnosis not present

## 2021-02-20 DIAGNOSIS — M71071 Abscess of bursa, right ankle and foot: Secondary | ICD-10-CM | POA: Diagnosis not present

## 2021-02-20 DIAGNOSIS — N189 Chronic kidney disease, unspecified: Secondary | ICD-10-CM

## 2021-02-20 DIAGNOSIS — I5032 Chronic diastolic (congestive) heart failure: Secondary | ICD-10-CM | POA: Diagnosis not present

## 2021-02-20 DIAGNOSIS — D631 Anemia in chronic kidney disease: Secondary | ICD-10-CM

## 2021-02-20 DIAGNOSIS — T847XXS Infection and inflammatory reaction due to other internal orthopedic prosthetic devices, implants and grafts, sequela: Secondary | ICD-10-CM

## 2021-02-20 DIAGNOSIS — I482 Chronic atrial fibrillation, unspecified: Secondary | ICD-10-CM | POA: Diagnosis not present

## 2021-02-20 DIAGNOSIS — E875 Hyperkalemia: Secondary | ICD-10-CM

## 2021-02-20 LAB — TYPE AND SCREEN
ABO/RH(D): O POS
Antibody Screen: NEGATIVE
Unit division: 0

## 2021-02-20 LAB — CBC
HCT: 27.5 % — ABNORMAL LOW (ref 39.0–52.0)
Hemoglobin: 8.8 g/dL — ABNORMAL LOW (ref 13.0–17.0)
MCH: 28.3 pg (ref 26.0–34.0)
MCHC: 32 g/dL (ref 30.0–36.0)
MCV: 88.4 fL (ref 80.0–100.0)
Platelets: 221 10*3/uL (ref 150–400)
RBC: 3.11 MIL/uL — ABNORMAL LOW (ref 4.22–5.81)
RDW: 14.5 % (ref 11.5–15.5)
WBC: 9.4 10*3/uL (ref 4.0–10.5)
nRBC: 0 % (ref 0.0–0.2)

## 2021-02-20 LAB — BASIC METABOLIC PANEL
Anion gap: 7 (ref 5–15)
Anion gap: 9 (ref 5–15)
BUN: 40 mg/dL — ABNORMAL HIGH (ref 8–23)
BUN: 48 mg/dL — ABNORMAL HIGH (ref 8–23)
CO2: 22 mmol/L (ref 22–32)
CO2: 21 mmol/L — ABNORMAL LOW (ref 22–32)
Calcium: 8.3 mg/dL — ABNORMAL LOW (ref 8.9–10.3)
Calcium: 8.5 mg/dL — ABNORMAL LOW (ref 8.9–10.3)
Chloride: 98 mmol/L (ref 98–111)
Chloride: 96 mmol/L — ABNORMAL LOW (ref 98–111)
Creatinine, Ser: 1.66 mg/dL — ABNORMAL HIGH (ref 0.61–1.24)
Creatinine, Ser: 1.58 mg/dL — ABNORMAL HIGH (ref 0.61–1.24)
GFR, Estimated: 42 mL/min — ABNORMAL LOW (ref >60)
GFR, Estimated: 44 mL/min — ABNORMAL LOW (ref >60)
Glucose, Bld: 247 mg/dL — ABNORMAL HIGH (ref 70–99)
Glucose, Bld: 239 mg/dL — ABNORMAL HIGH (ref 70–99)
Potassium: 5.9 mmol/L — ABNORMAL HIGH (ref 3.5–5.1)
Potassium: 5.1 mmol/L (ref 3.5–5.1)
Sodium: 127 mmol/L — ABNORMAL LOW (ref 135–145)
Sodium: 126 mmol/L — ABNORMAL LOW (ref 135–145)

## 2021-02-20 LAB — CULTURE, BLOOD (ROUTINE X 2)
Culture: NO GROWTH
Culture: NO GROWTH

## 2021-02-20 LAB — GLUCOSE, CAPILLARY
Glucose-Capillary: 226 mg/dL — ABNORMAL HIGH (ref 70–99)
Glucose-Capillary: 284 mg/dL — ABNORMAL HIGH (ref 70–99)
Glucose-Capillary: 235 mg/dL — ABNORMAL HIGH (ref 70–99)
Glucose-Capillary: 223 mg/dL — ABNORMAL HIGH (ref 70–99)

## 2021-02-20 LAB — BPAM RBC
Blood Product Expiration Date: 202204212359
ISSUE DATE / TIME: 202203181044
Unit Type and Rh: 5100

## 2021-02-20 LAB — MAGNESIUM: Magnesium: 1.9 mg/dL (ref 1.7–2.4)

## 2021-02-20 LAB — POTASSIUM: Potassium: 5.7 mmol/L — ABNORMAL HIGH (ref 3.5–5.1)

## 2021-02-20 MED ORDER — SODIUM ZIRCONIUM CYCLOSILICATE 5 G PO PACK
5.0000 g | PACK | Freq: Once | ORAL | Status: AC
  Administered 2021-02-20: 5 g via ORAL
  Filled 2021-02-20: qty 1

## 2021-02-20 NOTE — Progress Notes (Signed)
PROGRESS NOTE    Shane [name]  VZD:[id number] DOB: 1941-01-27 DOA: 02/15/2021 PCP: Burnard Bunting, MD   Brief Narrative:  Patient is a 80 years old male with past medical history of idiopathic pulmonary fibrosis who was on immunotherapy, hypertension, sleep apnea, diabetes mellitus type 2, chronic kidney disease stage IIIa, chronic atrial fibrillation, status post pacemaker congestive heart failure diastolic status post CABG, peripheral vascular disease status post right lower extremity endovascular revascularization in the past, fifth right toe ray amputation and ankle fusion in the past who was admitted by orthopedic service for cellulitis of the right foot.   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  Onvancomycin and Zosyn as per orthopedics.  Pain management with Percocet and Dilaudid.  History of ray amputation and ankle fusion.  For ankle debridement and placement of antibiotic beads today.   Cough.   History of sinus congestion.  Continue  Flonase spray, Protonix for now.  Completed Zithromax for acute bronchitis., continue nebulizers and inhaler.   Diabetes mellitus type 2 on insulin.With peripheral neuropathy,retinopathy.  ContinueSliding-scale insulin.  Will hold Farxiga for now due to underlying infection.  Last  POC glucose of 143.  Chronic diastolic congestive  heart failure.Status post CABG in the past.   Continue intake and output charting, daily weights.on fluid restriction 1500 mls per day. 2 g sodium diet. Continue   aspirin, Lipitor.  Continue to hold  losartan due to increased creatinine levels.  Essential hypertension.  Blood pressure borderline low.metoprolol and losartan on hold.  Resume metoprolol from a.m.  History of chronic atrial fibrillation. Rate controlled at this time. On Eliquis and metoprolol at home. on Eliquis.  Resume metoprolol from a.m.  History of idiopathic pulmonary fibrosis on immunotherapy as outpatient.,follows up with pulmonary as outpatient. Patient gets 3 monthly infusions with Dr. Chase Caller. Recently had infusion done.  Not on supplemental oxygen at home.  Sleep apneaon CPAP at home.  Anemia of chronic kidney disease. Monitor CBC.  Hemoglobin of 8.5.  Obesity. Would benefit from weight loss as outpatient.  Mild acute kidney injury on chronic kidney disease stage IIIa. Baseline creatinine around 1.2-1.5.  Creatinine today at 1.6.  Continue to hold losartan.  Continue IV fluids. Patient does not have a nephrologist as outpatient.  If upgoing trend of creatinine might need nephrology evaluation or at least nephrology follow-up as outpatient  Chronic hyponatremia.  Sodium of 127>128>126>127.  Closely monitor.  Asymptomatic at this time.  Borderline hyperkalemia.  Potassium 5.9 REPEAT 5.7, LOKELMA X 1, recheck 2100 and AM.  Will closely monitor.  Avoid potassium supplements.  Peripheral vascular disease.On pentoxifylline. Continue.  Generalized weakness and deconditioning.Will get PT evaluationonce okay with orthopedics.   DVT prophylaxis: IR:JJOACZY  Code Status: FULL    Code Status Orders  (From admission, onward)         Start     Ordered  02/15/21 1702  Full code  Continuous        02/15/21 1709        Code Status History    Date Active Date Inactive Code  Status Order ID Comments User Context   12/08/2020 2041 12/16/2020 2137 Full Code [id number]  Vernelle Emerald, MD ED   07/10/2020 0907 07/10/2020 1726 Full Code [id number]  Sherren Mocha, MD Inpatient   03/28/2020 1439 03/31/2020 1634 Full Code [id number]  Lequita Halt, MD ED   01/05/2018 2303 01/07/2018 1747 Full Code [id number]  Norval Morton, MD ED   12/26/2016 1254 12/29/2016 1407 Full Code [id number]  Marcene Corning Inpatient   11/09/2016 1525 11/09/2016 1859 Full Code [id number]  Belva Crome, MD Inpatient   Advance Care Planning Activity     Family Communication: WIFE AT BEDSIDE  Disposition Plan:   PER PRIMARY Consults called: None Admission status: Inpatient   Consultants:   TRH  Procedures:   No results found.  Antimicrobials:   PER PRIMARY, CTX AND VANC   Subjective: Doing well, wife at bedside, no acute events ovenright  Objective: Vitals:   02/20/21 0404 02/20/21 0500 02/20/21 0750 02/20/21 0827  BP: 130/75  (!) 146/58   Pulse: (!) 49  (!) 52   Resp: 17  17   Temp: 98.1 F (36.7 C)  (!) 97.4 F (36.3 C)   TempSrc:   Oral   SpO2: 99%  94% 96%  Weight:  111.9 kg    Height:        Intake/Output Summary (Last 24 hours) at 02/20/2021 1328 Last data filed at 02/19/2021 2003 Gross per 24 hour  Intake 920 ml  Output 180 ml  Net 740 ml   Filed Weights   02/17/21 0500 02/19/21 1428 02/20/21 0500  Weight: 112.3 kg 112.3 kg 111.9 kg    Examination:  General exam: Appears calm and comfortable  Respiratory system: Clear to auscultation. Respiratory effort normal. Cardiovascular system: S1 & S2 heard, RRR. No JVD, murmurs, rubs, gallops or clicks. No pedal edema. Gastrointestinal system: Abdomen is nondistended, soft and nontender. No organomegaly or masses felt. Normal bowel sounds heard. Central nervous system: Alert and oriented. No focal neurological deficits. Extremities: rle in wrapping, wwp, no bleed thru. Skin: No rashes, lesions or  ulcers Psychiatry: Judgement and insight appear normal. Mood & affect appropriate.     Data Reviewed: I have personally reviewed following labs and imaging studies  CBC: Recent Labs  Lab 02/15/21 1754 02/17/21 0355 02/18/21 0225 02/19/21 0406 02/19/21 1419 02/20/21 0104  WBC 10.6* 8.8 8.5 7.8  --  9.4  HGB 8.4* 8.5* 7.6* 7.6* 8.8* 8.8*  HCT 26.0* 26.0* 23.9* 23.3* 26.0* 27.5*  MCV 89.0 88.4 88.8 88.9  --  88.4  PLT 318 285 263 233  --  539   Basic Metabolic Panel: Recent Labs  Lab 02/15/21 1754 02/15/21 1811 02/17/21 0355 02/18/21 0225 02/19/21 0406 02/19/21 1419 02/20/21 0104 02/20/21 0912  NA 126*  --  127* 128* 126* 131* 127*  --   K 4.8  --  4.7 5.3* 5.4* 4.9 5.9* 5.7*  CL 90*  --  92* 94* 95* 97* 98  --   CO2 23  --  22 24 23   --  22  --   GLUCOSE 191*  --  147* 89 185* 107* 247*  --   BUN 64*  --  83* 74* 53* 42* 40*  --   CREATININE 2.66*  --  3.31* 2.92* 1.92* 1.60* 1.66*  --   CALCIUM 8.9  --  8.4* 8.2* 8.2*  --  8.3*  --   MG  --  2.2 2.5* 2.3 2.2  --  1.9  --   PHOS  --  5.7* 6.4* 4.6  --   --   --   --    GFR: Estimated Creatinine Clearance: 46.6 mL/min (A) (by C-G formula based on SCr of 1.66 mg/dL (H)). Liver Function Tests: Recent Labs  Lab 02/15/21 1754  AST 21  ALT 13  ALKPHOS 85  BILITOT 1.1  PROT 8.1  ALBUMIN 2.6*   No results for input(s): LIPASE, AMYLASE in the last 168 hours. No results for input(s): AMMONIA in the last 168 hours. Coagulation Profile: No results for input(s): INR, PROTIME in the last 168 hours. Cardiac Enzymes: No results for input(s): CKTOTAL, CKMB, CKMBINDEX, TROPONINI in the last 168 hours. BNP (last 3 results) No results for input(s): PROBNP in the last 8760 hours. HbA1C: No results for input(s): HGBA1C in the last 72 hours. CBG: Recent Labs  Lab 02/19/21 1542 02/19/21 1653 02/19/21 1958 02/20/21 0645 02/20/21 1121  GLUCAP 110* 111* 207* 226* 284*   Lipid Profile: No results for input(s): CHOL,  HDL, LDLCALC, TRIG, CHOLHDL, LDLDIRECT in the last 72 hours. Thyroid Function Tests: No results for input(s): TSH, T4TOTAL, FREET4, T3FREE, THYROIDAB in the last 72 hours. Anemia Panel: No results for input(s): VITAMINB12, FOLATE, FERRITIN, TIBC, IRON, RETICCTPCT in the last 72 hours. Sepsis Labs: No results for input(s): PROCALCITON, LATICACIDVEN in the last 168 hours.  Recent Results (from the past 240 hour(s))  Culture, blood (routine x 2)     Status: None   Collection Time: 02/15/21  6:49 PM   Specimen: BLOOD  Result Value Ref Range Status   Specimen Description BLOOD LEFT ANTECUBITAL  Final   Special Requests   Final    BOTTLES DRAWN AEROBIC ONLY Blood Culture results may not be optimal due to an inadequate volume of blood received in culture bottles   Culture   Final    NO GROWTH 5 DAYS Performed at [facility], [place] [address], [place], [place] [address]    Report Status 02/20/2021 FINAL  Final  Culture, blood (routine x 2)     Status: None   Collection Time: 02/15/21  6:49 PM   Specimen: BLOOD LEFT ARM  Result Value Ref Range Status   Specimen Description BLOOD LEFT ARM  Final   Special Requests   Final    BOTTLES DRAWN AEROBIC ONLY Blood Culture results may not be optimal due to an inadequate volume of blood received in culture bottles   Culture   Final    NO GROWTH 5 DAYS Performed at [facility], [place] [address] [place], [place] [address]    Report Status 02/20/2021 FINAL  Final  SARS Coronavirus 2 by RT PCR (hospital order, performed in [facility] hospital lab) Nasopharyngeal Nasopharyngeal Swab     Status: None   Collection Time: 02/19/21  8:15 AM   Specimen: Nasopharyngeal Swab  Result Value Ref Range Status   SARS Coronavirus 2 NEGATIVE NEGATIVE Final    Comment: (NOTE) SARS-CoV-2 target nucleic acids are NOT DETECTED.  The SARS-CoV-2 RNA is generally detectable in upper and lower respiratory specimens during the acute phase of infection. The  lowest concentration of SARS-CoV-2 viral copies this assay can detect is 250 copies / mL. A negative result does not preclude SARS-CoV-2 infection  and should not be used as the sole basis for treatment or other patient management decisions.  A negative result may occur with improper specimen collection / handling, submission of specimen other than nasopharyngeal swab, presence of viral mutation(s) within the areas targeted by this assay, and inadequate number of viral copies (<250 copies / mL). A negative result must be combined with clinical observations, patient history, and epidemiological information.  Fact Sheet for Patients:   StrictlyIdeas.no  Fact Sheet for Healthcare Providers: BankingDealers.co.za  This test is not yet approved or  cleared by the [place] FDA and has been authorized for detection and/or diagnosis of SARS-CoV-2 by FDA under an Emergency Use Authorization (EUA).  This EUA will remain in effect (meaning this test can be used) for the duration of the COVID-19 declaration under Section 564(b)(1) of the Act, 21 U.S.C. section 360bbb-3(b)(1), unless the authorization is terminated or revoked sooner.  Performed at [facility], [place] [address], [place], [place] [address]   Aerobic/Anaerobic Culture w Gram Stain (surgical/deep wound)     Status: None (Preliminary result)   Collection Time: 02/19/21  3:09 PM   Specimen: PATH Other; Tissue  Result Value Ref Range Status   Specimen Description TISSUE RIGHT ANKLE  Final   Special Requests NONE  Final   Gram Stain PENDING  Incomplete   Culture   Final    FEW GRAM NEGATIVE RODS IDENTIFICATION AND SUSCEPTIBILITIES TO FOLLOW Performed at [facility], [place] [address], [place], [place] [address]    Report Status PENDING  Incomplete         Radiology Studies: No results found.      Scheduled Meds: . apixaban  5 mg Oral BID  . aspirin EC  81 mg  Oral Daily  . atorvastatin  20 mg Oral BID  . budesonide  0.25 mg Nebulization BID  . feeding supplement  237 mL Oral BID BM  . fluticasone  2 spray Each Nare Daily  . gabapentin  100 mg Oral TID  . insulin aspart  0-9 Units Subcutaneous TID WC  . insulin aspart  10 Units Subcutaneous TID WC  . magnesium oxide  400 mg Oral Daily  . metoprolol tartrate  25 mg Oral BID  . nitroGLYCERIN  0.2 mg Transdermal Daily  . pantoprazole  40 mg Oral Daily  . pentoxifylline  400 mg Oral TID WC  . sodium zirconium cyclosilicate  5 g Oral Once   Continuous Infusions: . sodium chloride 75 mL/hr at 02/19/21 1717  . cefTRIAXone (ROCEPHIN)  IV 2 g (02/19/21 1718)  . lactated ringers 10 mL/hr at 02/19/21 1440  . vancomycin 1,500 mg (02/19/21 1752)     LOS: 5 days    Time spent: 8 min    Nicolette Bang, MD [facility]  If 7PM-7AM, please contact night-coverage  02/20/2021, 1:28 PM

## 2021-02-20 NOTE — Progress Notes (Signed)
Pt places self on/off as needed.

## 2021-02-20 NOTE — Evaluation (Signed)
Occupational Therapy Evaluation Patient Details Name: Shane [name] MRN: [id number] DOB: 12-Nov-1941 Today's Date: 02/20/2021    History of Present Illness Patient is a 80 y/o male s/p R tibial calcaneal fusion and 5th ray amputation on 2/18 who has developed cellulitis postoperatively and 1 small area of a draining hematoma over mid aspect of anterior incision. Patient now s/p I&D of R ankle on 3/18. PMH: idiopathic pulmonary fibrosis, OSA, HTN, Type 2 DM, CKD, Afib, CHF, s/p CABG x3 in 2021.   Clinical Impression   Pt presents with decline in function and safety with ADLs and ADL mobility with impaired balance and endurance. PTA, pt lived at home with his wife and was mostly Ind with ADLs/selfcare and used a RW and Transport planner for mobility. Pt currently requires mod A + 2 for sit - stand, SPTs, total A with toileting, ma x- total A with LB selfcare and fatigues easily. Pt having difficulty with TWB during SPT to recliner. Pt would benefit from acute OT services to address impairments to maximize level of function and safety    Follow Up Recommendations  SNF    Equipment Recommendations  None recommended by OT    Recommendations for Other Services       Precautions / Restrictions Precautions Precautions: Fall Required Braces or Orthoses: Other Brace Other Brace: CAM walker Restrictions Weight Bearing Restrictions: Yes RLE Weight Bearing: Touchdown weight bearing      Mobility Bed Mobility Overal bed mobility: Modified Independent                  Transfers Overall transfer level: Needs assistance Equipment used: Rolling walker (2 wheeled) Transfers: Sit to/from [facility] Sit to Stand: Mod assist;+2 physical assistance;+2 safety/equipment Stand pivot transfers: Mod assist;+2 physical assistance;+2 safety/equipment       General transfer comment: modA+2 for power up to standing, cues for hand placement and to kick R LE out in front to avoid  WBing. ModA+2 for stand pivot to chair with assist for balance and RW management. Therapist foot placed under patient's right foot for cueing to limit WBing in standing    Balance Overall balance assessment: Needs assistance Sitting-balance support: No upper extremity supported;Feet supported Sitting balance-Leahy Scale: Fair     Standing balance support: Bilateral upper extremity supported;During functional activity Standing balance-Leahy Scale: Poor Standing balance comment: reliant on UE support and external assist                           ADL either performed or assessed with clinical judgement   ADL Overall ADL's : Needs assistance/impaired Eating/Feeding: Independent;Sitting   Grooming: Wash/dry hands;Wash/dry face;Set up;Supervision/safety;Sitting   Upper Body Bathing: Set up;Supervision/ safety;Sitting   Lower Body Bathing: Maximal assistance   Upper Body Dressing : Set up;Supervision/safety;Sitting   Lower Body Dressing: Maximal assistance   Toilet Transfer: Moderate assistance;+2 for physical assistance;RW;Stand-pivot Toilet Transfer Details (indicate cue type and reason): simulated to recliner Toileting- Clothing Manipulation and Hygiene: Total assistance Toileting - Clothing Manipulation Details (indicate cue type and reason): pt stood at RW mod A for total A with posterior hygiene     Functional mobility during ADLs: Moderate assistance;+2 for physical assistance;Rolling walker       Vision Baseline Vision/History: Wears glasses Wears Glasses: Reading only Patient Visual Report: No change from baseline       Perception     Praxis      Pertinent Vitals/Pain Pain Assessment: Faces Faces  Pain Scale: Hurts little more Pain Location: R ankle Pain Descriptors / Indicators: Guarding;Grimacing Pain Intervention(s): Limited activity within patient's tolerance;Monitored during session;Repositioned     Hand Dominance Right   Extremity/Trunk  Assessment Upper Extremity Assessment Upper Extremity Assessment: Overall WFL for tasks assessed   Lower Extremity Assessment Lower Extremity Assessment: Defer to PT evaluation RLE Deficits / Details: grossly 4/5 RLE: Unable to fully assess due to immobilization   Cervical / Trunk Assessment Cervical / Trunk Assessment: Normal   Communication Communication Communication: No difficulties   Cognition Arousal/Alertness: Awake/alert Behavior During Therapy: WFL for tasks assessed/performed Overall Cognitive Status: Within Functional Limits for tasks assessed                                     General Comments  wife present at bedside    Exercises     Shoulder Instructions      Home Living Family/patient expects to be discharged to:: Private residence Living Arrangements: Spouse/significant other Available Help at Discharge: Family;Available PRN/intermittently Type of Home: House Home Access: Ramped entrance     Home Layout: Two level;Able to live on main level with bedroom/bathroom     Bathroom Shower/Tub: Occupational psychologist: Handicapped height Bathroom Accessibility: Yes   Home Equipment: Environmental consultant - 4 wheels;Cane - single point;Cane - quad;Shower seat;Grab bars - tub/shower;Hand held shower head;Wheelchair - power          Prior Functioning/Environment Level of Independence: Independent with assistive device(s)        Comments: using RW and electric w/c to navigate home and community. Ind with most ADLs, states his wife does help with washing back and buttocks        OT Problem List: Decreased strength;Impaired balance (sitting and/or standing);Pain;Decreased coordination;Decreased activity tolerance      OT Treatment/Interventions: Self-care/ADL training;Patient/family education;Therapeutic activities;DME and/or AE instruction    OT Goals(Current goals can be found in the care plan section) Acute Rehab OT Goals Patient Stated  Goal: to get better OT Goal Formulation: With patient/family Time For Goal Achievement: 03/06/21 Potential to Achieve Goals: Good ADL Goals Pt Will Perform Grooming: with set-up;sitting Pt Will Perform Upper Body Bathing: with set-up;sitting Pt Will Perform Lower Body Bathing: with mod assist;sitting/lateral leans Pt Will Perform Upper Body Dressing: with set-up;sitting Pt Will Transfer to Toilet: with mod assist;with min assist;stand pivot transfer;bedside commode  OT Frequency: Min 2X/week   Barriers to D/C:            Co-evaluation              AM-PAC OT "6 Clicks" Daily Activity     Outcome Measure Help from another person eating meals?: None Help from another person taking care of personal grooming?: A Little Help from another person toileting, which includes using toliet, bedpan, or urinal?: Total Help from another person bathing (including washing, rinsing, drying)?: A Lot Help from another person to put on and taking off regular upper body clothing?: A Little Help from another person to put on and taking off regular lower body clothing?: Total 6 Click Score: 14   End of Session Equipment Utilized During Treatment: Gait belt;Rolling walker Nurse Communication: Mobility status  Activity Tolerance: Patient limited by fatigue Patient left: in chair;with call bell/phone within reach;with chair alarm set;with family/visitor present  OT Visit Diagnosis: Unsteadiness on feet (R26.81);Other abnormalities of gait and mobility (R26.89);Muscle weakness (generalized) (M62.81);Pain Pain -  Right/Left: Right Pain - part of body: Ankle and joints of foot                Time: 2179-8102 OT Time Calculation (min): 42 min Charges:  OT General Charges $OT Visit: 1 Visit OT Evaluation $OT Eval Moderate Complexity: 1 Mod    Britt Bottom 02/20/2021, 12:25 PM

## 2021-02-20 NOTE — Plan of Care (Signed)

## 2021-02-20 NOTE — Progress Notes (Signed)
Patient ID: Shane [name], male   DOB: 1941-09-22, 80 y.o.   MRN: [id number] Patient is postoperative day 1 debridement abscess right ankle.  The abscess extended down to the hardware the hardware was removed except for 1 intramedullary screw that was outside the infection site.  Antibiotic beads were placed there is no drainage cultures are pending anticipate discharge once cultures are finalized.

## 2021-02-20 NOTE — Plan of Care (Signed)
  Problem: Education: Goal: Knowledge of General Education information will improve Description: Including pain rating scale, medication(s)/side effects and non-pharmacologic comfort measures 02/20/2021 0542 by Sherre Lain, RN Outcome: Progressing 02/20/2021 0327 by Sherre Lain, RN Outcome: Progressing   Problem: Health Behavior/Discharge Planning: Goal: Ability to manage health-related needs will improve 02/20/2021 0542 by Sherre Lain, RN Outcome: Progressing 02/20/2021 0327 by Sherre Lain, RN Outcome: Progressing   Problem: Clinical Measurements: Goal: Ability to maintain clinical measurements within normal limits will improve 02/20/2021 0542 by Sherre Lain, RN Outcome: Progressing 02/20/2021 0327 by Sherre Lain, RN Outcome: Progressing Goal: Will remain free from infection 02/20/2021 0542 by Sherre Lain, RN Outcome: Progressing 02/20/2021 0327 by Sherre Lain, RN Outcome: Progressing Goal: Diagnostic test results will improve 02/20/2021 0542 by Sherre Lain, RN Outcome: Progressing 02/20/2021 0327 by Sherre Lain, RN Outcome: Progressing Goal: Respiratory complications will improve 02/20/2021 0542 by Sherre Lain, RN Outcome: Progressing 02/20/2021 0327 by Sherre Lain, RN Outcome: Progressing Goal: Cardiovascular complication will be avoided 02/20/2021 0542 by Sherre Lain, RN Outcome: Progressing 02/20/2021 0327 by Sherre Lain, RN Outcome: Progressing   Problem: Activity: Goal: Risk for activity intolerance will decrease 02/20/2021 0542 by Sherre Lain, RN Outcome: Progressing 02/20/2021 0327 by Sherre Lain, RN Outcome: Progressing   Problem: Nutrition: Goal: Adequate nutrition will be maintained 02/20/2021 0542 by Sherre Lain, RN Outcome: Progressing 02/20/2021 0327 by Sherre Lain, RN Outcome: Progressing   Problem: Coping: Goal: Level of anxiety will  decrease 02/20/2021 0542 by Sherre Lain, RN Outcome: Progressing 02/20/2021 0327 by Sherre Lain, RN Outcome: Progressing   Problem: Elimination: Goal: Will not experience complications related to bowel motility 02/20/2021 0542 by Sherre Lain, RN Outcome: Progressing 02/20/2021 0327 by Sherre Lain, RN Outcome: Progressing Goal: Will not experience complications related to urinary retention 02/20/2021 0542 by Sherre Lain, RN Outcome: Progressing 02/20/2021 0327 by Sherre Lain, RN Outcome: Progressing   Problem: Pain Managment: Goal: General experience of comfort will improve 02/20/2021 0542 by Sherre Lain, RN Outcome: Progressing 02/20/2021 0327 by Sherre Lain, RN Outcome: Progressing   Problem: Safety: Goal: Ability to remain free from injury will improve 02/20/2021 0542 by Sherre Lain, RN Outcome: Progressing 02/20/2021 0327 by Sherre Lain, RN Outcome: Progressing   Problem: Skin Integrity: Goal: Risk for impaired skin integrity will decrease 02/20/2021 0542 by Sherre Lain, RN Outcome: Progressing 02/20/2021 0327 by Sherre Lain, RN Outcome: Progressing

## 2021-02-20 NOTE — Progress Notes (Signed)
   Subjective: 1 Day Post-Op Procedure(s) (LRB): RIGHT ANKLE DEBRIDEMENT AND PLACEMENT ANTIBIOTIC BEADS (Right) Patient reports pain as mild.    Objective: Vital signs in last 24 hours: Temp:  [97.9 F (36.6 C)-98.4 F (36.9 C)] 98.1 F (36.7 C) (03/19 0404) Pulse Rate:  [49-86] 49 (03/19 0404) Resp:  [13-18] 17 (03/19 0404) BP: (113-147)/(56-77) 130/75 (03/19 0404) SpO2:  [94 %-100 %] 99 % (03/19 0404) Weight:  [111.9 kg-112.3 kg] 111.9 kg (03/19 0500)  Intake/Output from previous day: 03/18 0701 - 03/19 0700 In: 920 [P.O.:120; I.V.:400; IV Piggyback:400] Out: 180 [Urine:150; Blood:30] Intake/Output this shift: No intake/output data recorded.  Recent Labs    02/18/21 0225 02/19/21 0406 02/19/21 1419 02/20/21 0104  HGB 7.6* 7.6* 8.8* 8.8*   Recent Labs    02/19/21 0406 02/19/21 1419 02/20/21 0104  WBC 7.8  --  9.4  RBC 2.62*  --  3.11*  HCT 23.3* 26.0* 27.5*  PLT 233  --  221   Recent Labs    02/19/21 0406 02/19/21 1419 02/20/21 0104  NA 126* 131* 127*  K 5.4* 4.9 5.9*  CL 95* 97* 98  CO2 23  --  22  BUN 53* 42* 40*  CREATININE 1.92* 1.60* 1.66*  GLUCOSE 185* 107* 247*  CALCIUM 8.2*  --  8.3*   No results for input(s): LABPT, INR in the last 72 hours.  VAC intact no fluid in cannister No results found.  Assessment/Plan: 1 Day Post-Op Procedure(s) (LRB): RIGHT ANKLE DEBRIDEMENT AND PLACEMENT ANTIBIOTIC BEADS (Right) Continue ABX therapy due to Post-op infection  Shane [name] 02/20/2021, 7:39 AM

## 2021-02-20 NOTE — Evaluation (Signed)
Physical Therapy Evaluation Patient Details Name: Shane [name] MRN: [id number] DOB: [birth year] Today's Date: 02/20/2021   History of Present Illness  Patient is a 80 y/o male s/p R tibial calcaneal fusion and 5th ray amputation on 2/18 who has developed cellulitis postoperatively and 1 small area of a draining hematoma over mid aspect of anterior incision. Patient now s/p I&D of R ankle on 3/18. PMH: idiopathic pulmonary fibrosis, OSA, HTN, Type 2 DM, CKD, Afib, CHF, s/p CABG x3 in 2021.  Clinical Impression  PTA, patient lives with wife and reports primary mobility is in power w/c but uses RW for short distances in the home. Patient presents with generalized weakness, impaired balance, decreased activity tolerance, and impaired functional mobility. Patient requires modA+2 for OOB transfers with RW, therapist foot under patient's R foot for cueing to prevent WBing. Patient able to maintain TWB at times but with fatigue patient tends to WB more. Patient will benefit from skilled PT services during acute stay to address listed deficits. Recommend SNF following discharge to maximize functional mobility and safety prior to returning home.     Follow Up Recommendations SNF;Supervision/Assistance - 24 hour    Equipment Recommendations  None recommended by PT (patient owns necessary DME)    Recommendations for Other Services       Precautions / Restrictions Precautions Precautions: Fall Required Braces or Orthoses: Other Brace Other Brace: CAM [place] Feldpausch Restrictions Weight Bearing Restrictions: Yes RLE Weight Bearing: Touchdown weight bearing      Mobility  Bed Mobility Overal bed mobility: Modified Independent                  Transfers Overall transfer level: Needs assistance Equipment used: Rolling Anilah Huck (2 wheeled) Transfers: Sit to/from [facility] Sit to Stand: Mod assist;+2 physical assistance;+2 safety/equipment Stand pivot transfers: Mod assist;+2  physical assistance;+2 safety/equipment       General transfer comment: modA+2 for power up to standing, cues for hand placement and to kick R LE out in front to avoid WBing. ModA+2 for stand pivot to chair with assist for balance and RW management. Therapist foot placed under patient's right foot for cueing to limit WBing in standing  Ambulation/Gait                Stairs            Wheelchair Mobility    Modified Rankin (Stroke Patients Only)       Balance Overall balance assessment: Needs assistance Sitting-balance support: No upper extremity supported;Feet supported Sitting balance-Leahy Scale: Fair     Standing balance support: Bilateral upper extremity supported;During functional activity Standing balance-Leahy Scale: Poor Standing balance comment: reliant on UE support and external assist                             Pertinent Vitals/Pain Pain Assessment: Faces Faces Pain Scale: Hurts little more Pain Location: R ankle Pain Descriptors / Indicators: Guarding;Grimacing Pain Intervention(s): Limited activity within patient's tolerance;Monitored during session;Repositioned    Home Living Family/patient expects to be discharged to:: Private residence Living Arrangements: Spouse/significant other Available Help at Discharge: Family;Available PRN/intermittently Type of Home: House Home Access: Ramped entrance     Home Layout: Two level;Able to live on main level with bedroom/bathroom Home Equipment: Jacquita Mulhearn - 4 wheels;Cane - single point;Cane - quad;Shower seat;Grab bars - tub/shower;Hand held shower head;Wheelchair - power      Prior Function Level of Independence: Independent with assistive  device(s)         Comments: using RW and electric w/c to navigate home and community. States his wife does help with washing back and buttocks     Hand Dominance   Dominant Hand: Right    Extremity/Trunk Assessment   Upper Extremity  Assessment Upper Extremity Assessment: Defer to OT evaluation    Lower Extremity Assessment Lower Extremity Assessment: RLE deficits/detail;Generalized weakness RLE Deficits / Details: grossly 4/5 RLE: Unable to fully assess due to immobilization       Communication   Communication: No difficulties  Cognition Arousal/Alertness: Awake/alert Behavior During Therapy: WFL for tasks assessed/performed Overall Cognitive Status: Within Functional Limits for tasks assessed                                        General Comments General comments (skin integrity, edema, etc.): wife present at bedside    Exercises     Assessment/Plan    PT Assessment Patient needs continued PT services  PT Problem List Decreased strength;Decreased range of motion;Decreased activity tolerance;Decreased balance;Decreased mobility;Decreased safety awareness       PT Treatment Interventions DME instruction;Gait training;Functional mobility training;Therapeutic activities;Therapeutic exercise;Balance training;Patient/family education    PT Goals (Current goals can be found in the Care Plan section)  Acute Rehab PT Goals Patient Stated Goal: to get better PT Goal Formulation: With patient Time For Goal Achievement: 03/06/21 Potential to Achieve Goals: Fair    Frequency Min 3X/week   Barriers to discharge        Co-evaluation               AM-PAC PT "6 Clicks" Mobility  Outcome Measure Help needed turning from your back to your side while in a flat bed without using bedrails?: None Help needed moving from lying on your back to sitting on the side of a flat bed without using bedrails?: None Help needed moving to and from a bed to a chair (including a wheelchair)?: A Lot Help needed standing up from a chair using your arms (e.g., wheelchair or bedside chair)?: A Lot Help needed to walk in hospital room?: Total Help needed climbing 3-5 steps with a railing? : Total 6 Click  Score: 14    End of Session Equipment Utilized During Treatment: Gait belt Activity Tolerance: Patient limited by fatigue Patient left: in chair;with call bell/phone within reach;with chair alarm set;with family/visitor present Nurse Communication: Mobility status PT Visit Diagnosis: Unsteadiness on feet (R26.81);Muscle weakness (generalized) (M62.81);Other abnormalities of gait and mobility (R26.89)    Time: 7416-3845 PT Time Calculation (min) (ACUTE ONLY): 42 min   Charges:   PT Evaluation $PT Eval Moderate Complexity: 1 Mod PT Treatments $Therapeutic Activity: 8-22 mins        Ionna Avis A. Gilford Rile PT, DPT Acute Rehabilitation Services Pager [phone] Office [phone]   Linna Hoff 02/20/2021, 11:59 AM

## 2021-02-21 DIAGNOSIS — I482 Chronic atrial fibrillation, unspecified: Secondary | ICD-10-CM | POA: Diagnosis not present

## 2021-02-21 DIAGNOSIS — N1831 Chronic kidney disease, stage 3a: Secondary | ICD-10-CM | POA: Diagnosis not present

## 2021-02-21 DIAGNOSIS — I5032 Chronic diastolic (congestive) heart failure: Secondary | ICD-10-CM | POA: Diagnosis not present

## 2021-02-21 DIAGNOSIS — M71071 Abscess of bursa, right ankle and foot: Secondary | ICD-10-CM | POA: Diagnosis not present

## 2021-02-21 LAB — GLUCOSE, CAPILLARY
Glucose-Capillary: 204 mg/dL — ABNORMAL HIGH (ref 70–99)
Glucose-Capillary: 203 mg/dL — ABNORMAL HIGH (ref 70–99)
Glucose-Capillary: 199 mg/dL — ABNORMAL HIGH (ref 70–99)
Glucose-Capillary: 165 mg/dL — ABNORMAL HIGH (ref 70–99)

## 2021-02-21 LAB — CBC WITH DIFFERENTIAL/PLATELET
Abs Immature Granulocytes: 0.06 10*3/uL (ref 0.00–0.07)
Basophils Absolute: 0 10*3/uL (ref 0.0–0.1)
Basophils Relative: 0 %
Eosinophils Absolute: 0 10*3/uL (ref 0.0–0.5)
Eosinophils Relative: 0 %
HCT: 27.2 % — ABNORMAL LOW (ref 39.0–52.0)
Hemoglobin: 9 g/dL — ABNORMAL LOW (ref 13.0–17.0)
Immature Granulocytes: 1 %
Lymphocytes Relative: 7 %
Lymphs Abs: 0.9 10*3/uL (ref 0.7–4.0)
MCH: 29 pg (ref 26.0–34.0)
MCHC: 33.1 g/dL (ref 30.0–36.0)
MCV: 87.7 fL (ref 80.0–100.0)
Monocytes Absolute: 1.1 10*3/uL — ABNORMAL HIGH (ref 0.1–1.0)
Monocytes Relative: 9 %
Neutro Abs: 10.3 10*3/uL — ABNORMAL HIGH (ref 1.7–7.7)
Neutrophils Relative %: 83 %
Platelets: 238 10*3/uL (ref 150–400)
RBC: 3.1 MIL/uL — ABNORMAL LOW (ref 4.22–5.81)
RDW: 14.4 % (ref 11.5–15.5)
WBC: 12.3 10*3/uL — ABNORMAL HIGH (ref 4.0–10.5)
nRBC: 0 % (ref 0.0–0.2)

## 2021-02-21 LAB — BASIC METABOLIC PANEL
Anion gap: 9 (ref 5–15)
BUN: 48 mg/dL — ABNORMAL HIGH (ref 8–23)
CO2: 21 mmol/L — ABNORMAL LOW (ref 22–32)
Calcium: 8.5 mg/dL — ABNORMAL LOW (ref 8.9–10.3)
Chloride: 96 mmol/L — ABNORMAL LOW (ref 98–111)
Creatinine, Ser: 1.44 mg/dL — ABNORMAL HIGH (ref 0.61–1.24)
GFR, Estimated: 49 mL/min — ABNORMAL LOW (ref >60)
Glucose, Bld: 213 mg/dL — ABNORMAL HIGH (ref 70–99)
Potassium: 5.1 mmol/L (ref 3.5–5.1)
Sodium: 126 mmol/L — ABNORMAL LOW (ref 135–145)

## 2021-02-21 MED ORDER — INSULIN GLARGINE 100 UNIT/ML ~~LOC~~ SOLN
5.0000 [IU] | Freq: Every day | SUBCUTANEOUS | Status: DC
  Administered 2021-02-21 – 2021-02-22 (×2): 5 [IU] via SUBCUTANEOUS
  Filled 2021-02-21 (×2): qty 0.05

## 2021-02-21 MED ORDER — METOPROLOL TARTRATE 12.5 MG HALF TABLET
12.5000 mg | ORAL_TABLET | Freq: Two times a day (BID) | ORAL | Status: DC
  Administered 2021-02-21 – 2021-02-22 (×2): 12.5 mg via ORAL
  Filled 2021-02-21 (×2): qty 1

## 2021-02-21 NOTE — Progress Notes (Signed)
PROGRESS NOTE    Shane [name]  TMH:[id number] DOB: 1941/02/24 DOA: 02/15/2021 PCP: Burnard Bunting, MD   Brief Narrative:  Patient is a 80 years old male with past medical history of idiopathic pulmonary fibrosis who was on immunotherapy, hypertension, sleep apnea, diabetes mellitus type 2, chronic kidney disease stage IIIa, chronic atrial fibrillation, status post pacemaker congestive heart failure diastolic status post CABG, peripheral vascular disease status post right lower extremity endovascular revascularization in the past, fifth right toe ray amputation and ankle fusion in the past who was admitted by orthopedic service for cellulitis of the right foot. 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 History of sinus congestion. Continue Flonase spray, Protonix for now. CompletedZithromaxfor acute bronchitis., continuenebulizers and inhaler.   Diabetes mellitus type 2 on insulin.With peripheral neuropathy,retinopathy. ContinueSliding-scale insulin.Added basal insulin for blood sugars persistently in the low 921J  Chronic diastolic congestive heart failure.Status post CABG in the past. Continue intake and output  charting, daily weights.on fluid restriction 1500 mls per day. 2 g sodium diet. Continue aspirin, Lipitor. Continue to hold losartan due to increased creatinine levels.  Essential hypertension.Blood pressure borderline low.metoprolol and losartan on hold.Resume metoprolol from a.m.  HYPERKALEMIA; Received 1 days of Lokelma on Saturday Monitoring potassium closely recent 5.1  History of chronic atrial fibrillation. Rate controlled at this time. On Eliquis and metoprolol at home. on Eliquis.Resume metoprolol from a.m.  History of idiopathic pulmonary fibrosis on immunotherapy as outpatient.,follows up with pulmonary as outpatient. Patient gets 3 monthly infusions with Dr. Chase Caller. Recently had infusion done.Not on supplemental oxygen at home.  Sleep apneaon CPAP at home.  Anemia of chronic kidney disease. Monitor CBC. Hemoglobin of 9.  Obesity. Would benefit from weight loss as outpatient.  Mild acute kidney injury on chronic kidney disease stage IIIa. Baseline creatinine around 1.2-1.5.Creatinine today at 1.44. Continue to hold losartan in the setting of elevated potassium.Continue IV fluids.Patient does not have a nephrologist as outpatient. If upgoing trend of creatinine might need nephrology evaluationor at least nephrology follow-up as outpatient  Chronic hyponatremia.Sodium of 127>128>126>127. Closely monitor.Asymptomatic at this time.  Peripheral vascular disease.On pentoxifylline. Continue.  Generalized weakness and deconditioning.Will get PT evaluationonce okay with orthopedics.  DVT prophylaxis: HE:[id number]  Code Status: FULL    Code Status Orders  (From admission, onward)         Start     Ordered   02/15/21 1702  Full code  Continuous        02/15/21 1709        Code Status History    Date Active Date Inactive Code Status Order ID Comments User Context   12/08/2020 2041 12/16/2020 2137 Full Code [id number]   Vernelle Emerald, MD ED   07/10/2020 0907 07/10/2020 1726 Full Code [id number]  Sherren Mocha, MD Inpatient   03/28/2020 1439 03/31/2020 1634 Full Code [id number]  Lequita Halt, MD ED   01/05/2018 2303 01/07/2018 1747 Full Code [id number]  Norval Morton, MD ED   12/26/2016 1254 12/29/2016 1407 Full Code [id number]  Marcene Corning Inpatient   11/09/2016 1525 11/09/2016 1859 Full Code [id number]  Belva Crome, MD Inpatient   Advance Care Planning Activity     Family Communication: None today, discussed with wife bedside yesterday extensively Disposition Plan:   Per primary team Consults called: None Admission status: Inpatient   Consultants:   Orthopedics  Procedures:   No results found.  Antimicrobials:   Ceftriaxone, vancomycin discontinued   Subjective: No acute events overnight, Resting in bed comfortably  Objective: Vitals:   02/20/21 2141 02/21/21 0256 02/21/21 0842 02/21/21 0855  BP: 134/65 113/75  134/71  Pulse: (!) 50 (!) 50 (!) 39 (!) 47  Resp: 16 16 16 18   Temp: 98.6 F (37 C) 98.6 F (37 C)  (!) 97.3 F (36.3 C)  TempSrc:    Oral  SpO2: 98% 98% 97% 100%  Weight:      Height:        Intake/Output Summary (Last 24 hours) at 02/21/2021 1408 Last data filed at 02/21/2021 0837 Gross per 24 hour  Intake --  Output 700 ml  Net -700 ml   Filed Weights   02/17/21 0500 02/19/21 1428 02/20/21 0500  Weight: 112.3 kg 112.3 kg 111.9 kg    Examination:  General exam: Appears calm and comfortable  Respiratory system: Clear to auscultation. Respiratory effort normal. Cardiovascular system: S1 & S2 heard, RRR. No JVD, murmurs, rubs, gallops or clicks. No pedal edema. Gastrointestinal system: Abdomen is nondistended, soft and nontender. No organomegaly or masses felt. Normal bowel sounds heard. Central nervous system: Alert and oriented. No focal neurological deficits. Extremities: Warm well perfused, surgical extremity in wrapping no bleedthrough, did not take  it down as surgery evaluated the patient earlier today Skin: No rashes, lesions or ulcers Psychiatry: Judgement and insight appear normal. Mood & affect appropriate.     Data Reviewed: I have personally reviewed following labs and imaging studies  CBC: Recent Labs  Lab 02/17/21 0355 02/18/21 0225 02/19/21 0406 02/19/21 1419 02/20/21 0104 02/21/21 0121  WBC 8.8 8.5 7.8  --  9.4 12.3*  NEUTROABS  --   --   --   --   --  10.3*  HGB 8.5* 7.6* 7.6* 8.8* 8.8* 9.0*  HCT 26.0* 23.9* 23.3* 26.0* 27.5* 27.2*  MCV 88.4 88.8 88.9  --  88.4 87.7  PLT 285 263 233  --  221 169   Basic Metabolic Panel: Recent Labs  Lab 02/15/21 1811 02/17/21 0355 02/18/21 0225 02/19/21 0406 02/19/21 1419 02/20/21 0104 02/20/21 0912 02/20/21 2101 02/21/21 0121  NA  --  127* 128* 126* 131* 127*  --  126* 126*  K  --  4.7 5.3* 5.4* 4.9 5.9* 5.7* 5.1 5.1  CL  --  92* 94* 95* 97* 98  --  96* 96*  CO2  --  22 24 23   --  22  --  21* 21*  GLUCOSE  --  147* 89 185* 107* 247*  --  239* 213*  BUN  --  83* 74* 53* 42* 40*  --  48* 48*  CREATININE  --  3.31* 2.92* 1.92* 1.60* 1.66*  --  1.58* 1.44*  CALCIUM  --  8.4* 8.2* 8.2*  --  8.3*  --  8.5* 8.5*  MG 2.2 2.5* 2.3 2.2  --  1.9  --   --   --   PHOS 5.7* 6.4* 4.6  --   --   --   --   --   --    GFR: Estimated Creatinine Clearance: 53.7 mL/min (A) (by C-G formula based on SCr of 1.44 mg/dL (H)). Liver Function Tests: Recent Labs  Lab 02/15/21 1754  AST 21  ALT 13  ALKPHOS 85  BILITOT 1.1  PROT 8.1  ALBUMIN 2.6*   No results for input(s): LIPASE, AMYLASE in the last 168 hours. No results for input(s): AMMONIA in the last 168 hours. Coagulation Profile: No results for input(s): INR, PROTIME in the last 168 hours. Cardiac Enzymes: No results for input(s): CKTOTAL, CKMB, CKMBINDEX, TROPONINI in the last 168 hours. BNP (last 3 results) No results for input(s): PROBNP in the last 8760 hours. HbA1C: No results for input(s): HGBA1C in the last 72  hours. CBG: Recent Labs  Lab 02/20/21 1121 02/20/21 1618 02/20/21 2141 02/21/21 0706 02/21/21 1209  GLUCAP 284* 235* 223* 204* 203*   Lipid Profile: No results for input(s): CHOL, HDL, LDLCALC, TRIG, CHOLHDL, LDLDIRECT in the last 72 hours. Thyroid Function Tests: No results for input(s): TSH, T4TOTAL, FREET4, T3FREE, THYROIDAB in the last 72 hours. Anemia Panel: No results for input(s): VITAMINB12, FOLATE, FERRITIN, TIBC, IRON, RETICCTPCT in the last 72 hours. Sepsis Labs: No results for input(s): PROCALCITON, LATICACIDVEN in the last 168 hours.  Recent Results (from the past 240 hour(s))  Culture, blood (routine x 2)     Status: None   Collection Time: 02/15/21  6:49 PM   Specimen: BLOOD  Result Value Ref Range Status   Specimen Description BLOOD LEFT ANTECUBITAL  Final   Special Requests   Final    BOTTLES DRAWN AEROBIC ONLY Blood Culture results may not be optimal due to an inadequate volume of blood received in culture bottles   Culture   Final    NO GROWTH 5 DAYS Performed at [facility], [place] [address], [place], [place] [address]    Report Status 02/20/2021 FINAL  Final  Culture, blood (routine x 2)     Status: None   Collection Time: 02/15/21  6:49 PM   Specimen: BLOOD LEFT ARM  Result Value Ref Range Status   Specimen Description BLOOD LEFT ARM  Final   Special Requests   Final    BOTTLES DRAWN AEROBIC ONLY Blood Culture results may not be optimal due to an inadequate volume of blood received in culture bottles   Culture   Final    NO GROWTH 5 DAYS Performed at [facility], [place] [address], [place], [place] [address]    Report Status 02/20/2021 FINAL  Final  SARS Coronavirus 2 by RT PCR (hospital order, performed in [place] hospital lab) Nasopharyngeal Nasopharyngeal Swab     Status: None   Collection Time: 02/19/21  8:15 AM   Specimen: Nasopharyngeal Swab  Result Value Ref Range Status   SARS Coronavirus 2 NEGATIVE NEGATIVE Final     Comment: (NOTE) SARS-CoV-2 target nucleic acids are NOT DETECTED.  The SARS-CoV-2 RNA is generally detectable in upper and lower respiratory specimens during the acute phase of infection. The lowest concentration of SARS-CoV-2 viral copies this assay can detect is 250 copies / mL. A negative result does not preclude SARS-CoV-2 infection and should not be used as the sole basis for treatment or other patient management decisions.  A negative result may occur with improper specimen collection / handling, submission of specimen other than nasopharyngeal swab, presence of viral mutation(s) within the areas targeted by this assay, and inadequate number of viral copies (<250 copies / mL). A negative result must be combined with clinical observations, patient history, and epidemiological information.  Fact Sheet for Patients:   StrictlyIdeas.no  Fact Sheet for Healthcare Providers: BankingDealers.co.za  This test is not yet approved or  cleared by the [place] FDA and has been authorized for detection and/or diagnosis of SARS-CoV-2 by FDA under an Emergency Use Authorization (EUA).  This EUA will remain in effect (meaning this test can be used) for the duration of the COVID-19 declaration under Section 564(b)(1) of the Act, 21 U.S.C. section 360bbb-3(b)(1), unless the authorization is terminated or revoked sooner.  Performed at [facility], [place] [address] [place], [place] [address]   Aerobic/Anaerobic Culture w Gram Stain (surgical/deep wound)     Status: None (Preliminary result)   Collection Time: 02/19/21  3:09 PM   Specimen: PATH Other; Tissue  Result Value Ref Range Status   Specimen Description TISSUE RIGHT ANKLE  Final   Special Requests NONE  Final   Gram Stain PENDING  Incomplete   Culture   Final    FEW ESCHERICHIA COLI CULTURE REINCUBATED FOR BETTER GROWTH Performed at [facility], [place] [address],  [place], [place] [address]    Report Status PENDING  Incomplete   Organism ID, Bacteria ESCHERICHIA COLI  Final      Susceptibility   Escherichia coli - MIC*    AMPICILLIN >=32 RESISTANT Resistant     CEFAZOLIN <=4 SENSITIVE Sensitive     CEFEPIME <=0.12 SENSITIVE Sensitive     CEFTAZIDIME <=1 SENSITIVE Sensitive     CEFTRIAXONE <=0.25 SENSITIVE Sensitive     CIPROFLOXACIN <=0.25 SENSITIVE Sensitive     GENTAMICIN >=16 RESISTANT Resistant     IMIPENEM <=0.25 SENSITIVE Sensitive     TRIMETH/SULFA <=20 SENSITIVE Sensitive     AMPICILLIN/SULBACTAM >=32 RESISTANT Resistant     PIP/TAZO <=4 SENSITIVE Sensitive     * FEW ESCHERICHIA COLI         Radiology Studies: No results found.      Scheduled Meds: . apixaban  5 mg Oral BID  . aspirin EC  81 mg Oral Daily  . atorvastatin  20 mg Oral BID  . budesonide  0.25 mg Nebulization BID  . feeding supplement  237 mL Oral BID BM  . fluticasone  2 spray Each Nare Daily  . gabapentin  100 mg Oral TID  . insulin aspart  0-9 Units Subcutaneous TID WC  . insulin aspart  10 Units Subcutaneous TID WC  . insulin glargine  5 Units Subcutaneous Daily  . magnesium oxide  400 mg Oral Daily  . metoprolol tartrate  12.5 mg Oral BID  . nitroGLYCERIN  0.2 mg Transdermal Daily  . pantoprazole  40 mg Oral Daily  . pentoxifylline  400 mg Oral TID WC   Continuous Infusions: . sodium chloride Stopped (02/21/21 0700)  . cefTRIAXone (ROCEPHIN)  IV 2 g (02/20/21 1726)  . lactated ringers 10 mL/hr at 02/19/21 1440     LOS: 6 days    Time spent: [place], MD [facility]  If 7PM-7AM, please contact night-coverage  02/21/2021, 2:08 PM

## 2021-02-21 NOTE — Progress Notes (Signed)
   Subjective: 2 Days Post-Op Procedure(s) (LRB): RIGHT ANKLE DEBRIDEMENT AND PLACEMENT ANTIBIOTIC BEADS (Right) Patient reports pain as mild.    Objective: Vital signs in last 24 hours: Temp:  [97.3 F (36.3 C)-98.6 F (37 C)] 97.3 F (36.3 C) (03/20 0855) Pulse Rate:  [39-61] 47 (03/20 0855) Resp:  [16-18] 18 (03/20 0855) BP: (113-134)/(61-75) 134/71 (03/20 0855) SpO2:  [95 %-100 %] 100 % (03/20 0855)  Intake/Output from previous day: 03/19 0701 - 03/20 0700 In: -  Out: 500 [Urine:500] Intake/Output this shift: Total I/O In: -  Out: 200 [Urine:200]  Recent Labs    02/19/21 0406 02/19/21 1419 02/20/21 0104 02/21/21 0121  HGB 7.6* 8.8* 8.8* 9.0*   Recent Labs    02/20/21 0104 02/21/21 0121  WBC 9.4 12.3*  RBC 3.11* 3.10*  HCT 27.5* 27.2*  PLT 221 238   Recent Labs    02/20/21 2101 02/21/21 0121  NA 126* 126*  K 5.1 5.1  CL 96* 96*  CO2 21* 21*  BUN 48* 48*  CREATININE 1.58* 1.44*  GLUCOSE 239* 213*  CALCIUM 8.5* 8.5*   No results for input(s): LABPT, INR in the last 72 hours.  no VAC  output No results found.  Assessment/Plan: 2 Days Post-Op Procedure(s) (LRB): RIGHT ANKLE DEBRIDEMENT AND PLACEMENT ANTIBIOTIC BEADS (Right)  Continue ABX, no gram positive so VANC was stopped. Continue  Rocephin for now.   Shane [name] 02/21/2021, 10:33 AM

## 2021-02-21 NOTE — Plan of Care (Signed)

## 2021-02-21 NOTE — Progress Notes (Signed)
Orthopedic Tech Progress Note Patient Details:  Shane [name] 29-Oct-1941 [id number] Spoke with RN she said patient has CAM WALKER BOOT.  Patient ID: [name], male   DOB: September 07, 1941, 80 y.o.   MRN: [id number]   [name] 02/21/2021, 9:33 AM

## 2021-02-21 NOTE — Progress Notes (Signed)
Patient has home CPAP at bedside and places himself on when ready. RT will monitor as needed.

## 2021-02-22 ENCOUNTER — Encounter (HOSPITAL_COMMUNITY): Payer: Self-pay | Admitting: Orthopedic Surgery

## 2021-02-22 LAB — AEROBIC/ANAEROBIC CULTURE W GRAM STAIN (SURGICAL/DEEP WOUND)

## 2021-02-22 LAB — BASIC METABOLIC PANEL
Anion gap: 7 (ref 5–15)
BUN: 46 mg/dL — ABNORMAL HIGH (ref 8–23)
CO2: 23 mmol/L (ref 22–32)
Calcium: 8.6 mg/dL — ABNORMAL LOW (ref 8.9–10.3)
Chloride: 99 mmol/L (ref 98–111)
Creatinine, Ser: 1.26 mg/dL — ABNORMAL HIGH (ref 0.61–1.24)
GFR, Estimated: 58 mL/min — ABNORMAL LOW (ref >60)
Glucose, Bld: 166 mg/dL — ABNORMAL HIGH (ref 70–99)
Potassium: 4.7 mmol/L (ref 3.5–5.1)
Sodium: 129 mmol/L — ABNORMAL LOW (ref 135–145)

## 2021-02-22 LAB — GLUCOSE, CAPILLARY
Glucose-Capillary: 155 mg/dL — ABNORMAL HIGH (ref 70–99)
Glucose-Capillary: 311 mg/dL — ABNORMAL HIGH (ref 70–99)
Glucose-Capillary: 200 mg/dL — ABNORMAL HIGH (ref 70–99)

## 2021-02-22 MED ORDER — CIPROFLOXACIN HCL 500 MG PO TABS: 500.0000 mg | ORAL_TABLET | Freq: Two times a day (BID) | ORAL | 0 refills | Status: DC

## 2021-02-22 MED ORDER — HYDROCODONE-ACETAMINOPHEN 5-325 MG PO TABS: 1.0000 | ORAL_TABLET | ORAL | 0 refills | Status: DC | PRN

## 2021-02-22 NOTE — Discharge Summary (Signed)
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 male who was admitted 02/15/2021 with a chief complaint of infected ankle fusion, with a final diagnosis of Abscess Right Ankle, Infected Hardware.  Patient was brought to the operating room on 02/19/2021 and underwent Procedure(s): RIGHT ANKLE DEBRIDEMENT AND PLACEMENT ANTIBIOTIC BEADS.    Patient was given perioperative antibiotics:  Anti-infectives (From admission, onward)   Start     Dose/Rate Route Frequency Ordered Stop   02/22/21 0000  ciprofloxacin (CIPRO) 500 MG tablet        500 mg Oral 2 times daily 02/22/21 0710 03/24/21 2359   02/19/21 1800  vancomycin (VANCOREADY) IVPB 1500 mg/300 mL  Status:  Discontinued        1,500 mg 150 mL/hr over 120 Minutes Intravenous Every 36 hours 02/19/21 1008 02/21/21 1017   02/19/21 1433  gentamicin (GARAMYCIN) injection  Status:  Discontinued          As needed 02/19/21 1433 02/19/21 1546   02/19/21 1433  vancomycin (VANCOCIN) powder  Status:  Discontinued          As needed 02/19/21 1434 02/19/21 1546   02/19/21 1426  ceFAZolin (ANCEF) 2-4 GM/100ML-% IVPB       Note to Pharmacy: Baird Lyons  : cabinet override      02/19/21 1426 02/19/21 1450   02/19/21 1300  ceFAZolin (ANCEF) IVPB 2g/100 mL premix        2 g 200 mL/hr over 30 Minutes Intravenous  On call to O.R. 02/18/21 2341 02/19/21 1519   02/18/21 0600  vancomycin (VANCOREADY) IVPB 1250 mg/250 mL  Status:  Discontinued        1,250 mg 166.7 mL/hr over 90 Minutes Intravenous Every 48 hours 02/16/21 0840 02/17/21 1420   02/18/21 0600  vancomycin (VANCOREADY) IVPB 1000 mg/200 mL  Status:  Discontinued        1,000 mg 200 mL/hr over 60 Minutes Intravenous Every 48 hours 02/17/21 1420 02/19/21 1007   02/17/21 1800  cefTRIAXone (ROCEPHIN) 2 g in sodium chloride 0.9 % 100 mL IVPB        2 g 200 mL/hr over 30 Minutes Intravenous Every 24 hours 02/17/21 0840     02/16/21 0930  vancomycin (VANCOREADY) IVPB 1500 mg/300 mL        1,500 mg 150 mL/hr over 120 Minutes Intravenous  Once 02/16/21 0830 02/16/21 1157   02/16/21 0915  piperacillin-tazobactam (ZOSYN) IVPB 3.375 g        3.375 g 12.5 mL/hr over 240 Minutes Intravenous Every 8 hours 02/16/21 0906 02/17/21 1215   02/15/21 1900  azithromycin (ZITHROMAX) tablet 500 mg        500 mg Oral Daily 02/15/21 1809 02/17/21 0814    .  Patient was given sequential compression devices, early ambulation, and aspirin for DVT prophylaxis.  Recent vital signs:  Patient  Vitals for the past 24 hrs:  BP Temp Temp src Pulse Resp SpO2 Weight  02/22/21 0500 -- -- -- -- -- -- 113.3 kg  02/22/21 0319 (!) 131/57 -- -- (!) 41 16 97 % --  02/21/21 2053 -- -- -- -- -- 98 % --  02/21/21 2018 (!) 137/58 97.9 F (36.6 C) Oral (!) 51 19 100 % --  02/21/21 1519 (!) 155/84 98 F (36.7 C) -- (!) 54 18 100 % --  02/21/21 0855 134/71 (!) 97.3 F (36.3 C) Oral (!) 47 18 100 % --  02/21/21 0842 -- -- -- (!) 39 16 97 % --  .  Recent laboratory studies: No results found.  Discharge Medications:   Allergies as of 02/22/2021      Reactions   Codeine Hives, Itching   Ofev [nintedanib] Diarrhea   SEVERE DIARRHEA   Other Diarrhea, Other (See Comments)   Esbriet causes elevated LFTs. D/C on 06/14/17 and SEVERE DIARRHEA      Medication List    TAKE these  medications   albuterol 108 (90 Base) MCG/ACT inhaler Commonly known as: VENTOLIN HFA Inhale 2 puffs into the lungs every 4 (four) hours as needed for wheezing or shortness of breath.   apixaban 5 MG Tabs tablet Commonly known as: ELIQUIS Take 1 tablet (5 mg total) by mouth 2 (two) times daily.   aspirin EC 81 MG tablet Take 81 mg by mouth daily.   atorvastatin 40 MG tablet Commonly known as: LIPITOR Take 20 mg by mouth in the morning and at bedtime.   ciprofloxacin 500 MG tablet Commonly known as: Cipro Take 1 tablet (500 mg total) by mouth 2 (two) times daily.   Ensure Max Protein Liqd Take 330 mLs (11 oz total) by mouth 2 (two) times daily.   EPINEPHrine 0.3 mg/0.3 mL Soaj injection Commonly known as: EPI-PEN Inject 0.3 mg into the muscle once as needed for anaphylaxis (max 3 doses).   Farxiga 10 MG Tabs tablet Generic drug: dapagliflozin propanediol Take 10 mg by mouth daily.   FreeStyle Libre 14 Day Sensor Misc 1 patch every 14 (fourteen) days.   furosemide 40 MG tablet Commonly known as: LASIX Take 40 mg by mouth daily.   HYDROcodone-acetaminophen 5-325 MG tablet Commonly known as: NORCO/VICODIN Take 1 tablet by mouth every 4 (four) hours as needed.   insulin lispro 100 UNIT/ML injection Commonly known as: HUMALOG Inject 0.07 mLs (7 Units total) into the skin 3 (three) times daily with meals. What changed: how much to take   iron polysaccharides 150 MG capsule Commonly known as: NIFEREX Take 150 mg by mouth daily.   LORazepam 1 MG tablet Commonly known as: ATIVAN Take 1 mg by mouth at bedtime as needed for sleep.   losartan 50 MG tablet Commonly known as: COZAAR Take 50 mg by mouth daily.   magnesium oxide 400 MG tablet Commonly known as: MAG-OX TAKE 1 CAPSULE BY MOUTH ONCE DAILY What changed: how much to take   metoprolol tartrate 50 MG tablet Commonly known as: LOPRESSOR Take 25 mg by mouth 2 (two) times daily.   multivitamin with minerals  Tabs tablet Take 1 tablet by mouth daily.   ondansetron 4 MG tablet Commonly known as: Zofran Take 1 tablet (4 mg total) by mouth every 8 (eight) hours as needed for nausea or vomiting.   pantoprazole 40 MG tablet Commonly known as: PROTONIX Take 40 mg by mouth daily.   polyethylene glycol powder 17 GM/SCOOP powder Commonly  known as: GLYCOLAX/MIRALAX Take 17 g by mouth daily as needed for moderate constipation.   vitamin C with rose hips 500 MG tablet Take 500 mg by mouth daily.       Diagnostic Studies: No results found.  Patient benefited maximally from their hospital stay and there were no complications.     Disposition:  Discharge Instructions    Negative Pressure Wound Therapy - Incisional   Complete by: As directed    Show patient how to attach preveena vac. Call office if vac alarms      Follow-up Information    Persons, Bevely Palmer, [place] In 1 week.   Specialty: Orthopedic Surgery Contact information: [address] [place] [address] [phone]                Signed: [name] 02/22/2021, 7:13 AM

## 2021-02-22 NOTE — Plan of Care (Signed)
  Problem: Activity: Goal: Risk for activity intolerance will decrease Outcome: Progressing   Problem: Nutrition: Goal: Adequate nutrition will be maintained Outcome: Progressing   Problem: Safety: Goal: Ability to remain free from injury will improve Outcome: Progressing   Problem: Pain Managment: Goal: General experience of comfort will improve Outcome: Progressing   

## 2021-02-22 NOTE — Plan of Care (Signed)

## 2021-02-22 NOTE — Progress Notes (Signed)
Discharge package printed and instructions given to patient and wife. Both patient and wife verbalizes understanding.

## 2021-02-22 NOTE — Progress Notes (Signed)
PT Cancellation Note  Patient Details Name: Shane [name] MRN: [id number] DOB: [birth year]   Cancelled Treatment:    Reason Eval/Treat Not Completed: Other (comment). Pt reports "I'm going home today, I don't need to do any physical therapy. I have all the equip at home I need and it's so much easier to move around." pt's personal aide present and agreed to patients statement but also stated 'He just doesn't want to do it. But we do have grab bars and power scooter that allows him to pull up to stand a lot easier." pt also talked about have loose stool problems and that if he moves we will have to clean him up. PT asked what he was going to do when he had to get up to get in the w/c to go home. Pt stated 'I don't have a long ride." Educated on why it's important not to sit in stool due to causing risk of infection and skin break down. Acute PT to return as able, if needed to progress mobility if patient doesn't d/c today. Pt states he has PT and HH RN that each come out 3x/week.  Kittie Plater, PT, DPT Acute Rehabilitation Services Pager #: [phone] Office #: [phone]    Berline Lopes 02/22/2021, 10:26 AM

## 2021-02-22 NOTE — TOC Transition Note (Signed)
Transition of Care [facility]) - CM/SW Discharge Note   Patient Details  Name: Shane [name] MRN: [id number] Date of Birth: 09-May-1941  Transition of Care [facility]) CM/SW Contact:  Sharin Mons, RN Phone Number: 02/22/2021, 2:47 PM   Clinical Narrative:    Patient will DC to: home Anticipated DC date: 02/22/2021 Family notified: wife Transport by: car  Presented with Abscess Right Ankle, Infected Hardware     -    S/p  R ankle debridement and placement of antibiotic beads , application of Prevena woundvac on 02/19/2021    Per MD patient ready for DC today. RN, patient and patient's family. Pt from home with wife. Agreeable to home health services, preferably [facility]. Referral made with [facility] and accepted, SOC within 48 hrs post d/c. Pt states has used [facility] in the past and was pleased with their services. Pt without DME needs.  Pt without Rx med concerns or affordability issues.  Post hospital f/u appointment noted on AVS.  RNCM will sign off for now as intervention is no longer needed. Please consult [place] again if new needs arise.    Final next level of care: Home w Home Health Services Barriers to Discharge: No Barriers Identified   Patient Goals and CMS Choice Patient states their goals for this hospitalization and ongoing recovery are:: go home and get better   Choice offered to / list presented to : Patient  Discharge Placement                       Discharge Plan and Services                DME Arranged: N/A         HH Arranged: PT HH Agency: [facility] Date [facility] Agency Contacted: 02/22/21 Time [place]: [id number] Representative spoke with at [place]: [place] ([place]) Interventions     Readmission Risk Interventions No flowsheet data found.

## 2021-02-23 ENCOUNTER — Encounter: Payer: Medicare Other | Admitting: Internal Medicine

## 2021-02-23 ENCOUNTER — Encounter: Payer: Self-pay | Admitting: Orthopedic Surgery

## 2021-02-23 ENCOUNTER — Telehealth: Payer: Self-pay

## 2021-02-23 NOTE — Telephone Encounter (Signed)
Patients wife [name] called she stated patient has surgery Friday and the wound vac hasn't suctioning since they have been back at home she also stated the wound vac wasn't suctioning while the patient was in the hospital as well, [name] stated about a hour ago she could hear the vac start suctioning and she could see some of the fluid being suctioned. [name] also stated patient was taking 100 mg gabapentin and stated the rx for gabapentin wasn't on the discharge summary is the patient supposed to be taking gabapentin? call back:[phone]

## 2021-02-23 NOTE — Telephone Encounter (Signed)
I called and sw both pt and pt's wife to verify that the vac was charged that the indicator light for leak or clog was not on. Everything is charged and working as it should reassured any concerns about what to look for if there was a problem. Pt was not taking gabapentin prior to hospital admission and does not know if it had provided any help while he was there. Advised that we can discuss this at his office vit on Thursday. Will call with any other questions.Marland Kitchen

## 2021-02-23 NOTE — Progress Notes (Signed)
Office Visit Note   Patient: Shane [name]           Date of Birth: 07/16/1941           MRN: [id number] Visit Date: 02/09/2021              Requested by: Burnard Bunting, MD [address] [place],  [place] [address] PCP: Burnard Bunting, MD  Chief Complaint  Patient presents with  . Right Ankle - Follow-up      HPI: Patient is a 80 year old gentleman who is approximately 3 weeks status post right foot tibial calcaneal fusion he is currently in a PRAFO boot states he has had a little bit of drainage from the incision.  Assessment & Plan: Visit Diagnoses:  1. 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 S/P ankle fusion     Plan: We will apply a Dynaflex compression wrap follow-up twice a week he will elevate his foot.  Follow-Up Instructions: Return in about 1 week (around 02/16/2021).   Ortho Exam  Patient is alert, oriented, no adenopathy, well-dressed, normal affect, normal respiratory effort. Examination patient has increased swelling he has a new blister on the dorsum of his foot secondary to swelling.  From the anterior incision there is a minimal amount of serous drainage there is no cellulitis no wound dehiscence.  There is a small area of dehiscence of the fifth ray amputation incision he does have a stable eschar over the heel and first metatarsal head.  Imaging: No results found. No images are attached to the encounter.  Labs: Lab Results  Component Value Date   HGBA1C 7.8 (H) 02/15/2021   HGBA1C 6.6 (H) 12/09/2020   HGBA1C 7.6 (H) 03/28/2020   ESRSEDRATE 22 (H) 10/14/2016   CRP 8.3 (H) 12/09/2020   CRP 4.1 (H) 07/17/2020   REPTSTATUS 02/22/2021 FINAL 02/19/2021   GRAMSTAIN  02/19/2021    RARE WBC PRESENT, PREDOMINANTLY PMN NO ORGANISMS SEEN    CULT  02/19/2021    FEW ESCHERICHIA COLI NO ANAEROBES ISOLATED Performed at [facility], [place] [address], [place], [place] [address]    LABORGA ESCHERICHIA COLI  02/19/2021     Lab Results  Component Value Date   ALBUMIN 2.6 (L) 02/15/2021   ALBUMIN 2.0 (L) 12/16/2020   ALBUMIN 2.5 (L) 12/09/2020    Lab Results  Component Value Date   MG 1.9 02/20/2021   MG 2.2 02/19/2021   MG 2.3 02/18/2021   No results found for: VD25OH  No results found for: PREALBUMIN CBC EXTENDED Latest Ref Rng & Units 02/21/2021 02/20/2021 02/19/2021  WBC 4.0 - 10.5 K/uL 12.3(H) 9.4 -  RBC 4.22 - 5.81 MIL/uL 3.10(L) 3.11(L) -  HGB 13.0 - 17.0 g/dL 9.0(L) 8.8(L) 8.8(L)  HCT 39.0 - 52.0 % 27.2(L) 27.5(L) 26.0(L)  PLT 150 - 400 K/uL 238 221 -  NEUTROABS 1.7 - 7.7 K/uL 10.3(H) - -  LYMPHSABS 0.7 - 4.0 K/uL 0.9 - -     There is no height or weight on file to calculate BMI.  Orders:  Orders Placed This Encounter  Procedures  . XR Ankle Complete Right   No orders of the defined types were placed in this encounter.    Procedures: No procedures performed  Clinical Data: No additional findings.  ROS:  All other systems negative, except as noted in the HPI. Review of Systems  Objective: Vital Signs: There were no vitals taken for this visit.  Specialty Comments:  No specialty comments available.  PMFS History: Patient Active Problem List   Diagnosis Date Noted  . Hardware complicating wound infection ([place])   . Abscess of bursa of right ankle 02/15/2021  . 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 Peripheral vascular disease ([place]) 12/29/2020  . CHF (congestive heart failure), NYHA class II, chronic, diastolic ([place]) 56/43/3295  . History of COVID-19 12/22/2020  . SSS (sick sinus syndrome) ([place]) 12/22/2020  . Acute bacterial endocarditis   . MSSA bacteremia 12/09/2020  . Diabetic foot ulcer ([place]) 12/09/2020  . Foot drop, right foot   . Generalized weakness 12/08/2020  . Dehydration with hyponatremia 12/08/2020  . Atrial fibrillation, chronic ([place]) 12/08/2020  . Elevated troponin level not due myocardial infarction 12/08/2020  . Adrenal insufficiency ([place])  11/14/2020  . Orthostatic hypotension 11/14/2020  . Physical deconditioning 11/14/2020  . Difficulty sleeping 11/07/2020  . Postoperative anemia due to acute blood loss 11/07/2020  . Right ventricular dysfunction 11/05/2020  . 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 Hearing loss 11/03/2020  . Cardiac device in situ 09/09/2020  . Coronary atherosclerosis due to lipid rich plaque 09/02/2020  . COVID-19 virus infection 07/18/2020  . Coronary artery disease involving native heart without angina pectoris 07/10/2020  . Chronic kidney disease, stage 3a ([place]) 03/29/2020  . 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 ([place]) 03/16/2020  . Right epiretinal membrane 03/16/2020  . Posterior vitreous detachment of right eye 03/16/2020  . Aortic stenosis, moderate 03/12/2020  . 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 Near syncope 04/27/2018  . Hypoglycemia due to insulin 01/06/2018  . Essential hypertension 01/06/2018  . Diabetes mellitus type 2, with complication, on long term insulin pump ([place]) 01/06/2018  . Syncope and collapse 01/05/2018  . Encounter for therapeutic drug monitoring 06/29/2017  . OSA on CPAP 05/05/2017  . 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[place]) 01/05/2017  . Abnormal chest x-ray 10/12/2016  . Benign neoplasm of colon 01/14/2013   Past Medical History:  Diagnosis Date  . 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 Cataract   . Chronic kidney disease    d/t DM  . CKD (chronic kidney disease), stage III ([place])   . Diabetes mellitus    Vgo disposal insulin bolus  simular to insulin pump  . Dyspnea   . GERD (gastroesophageal reflux disease)   . History of kidney stones    passed  . Hyperlipidemia   . Hypertension   . Idiopathic pulmonary fibrosis ([place]) 11/2016  . ILD (interstitial lung  disease) ([place])   . Moderate aortic stenosis    a. 10/2019 Echo: EF 55-60%, Gr2 DD. Nl RV.   Marland Kitchen Neuromuscular disorder ([place])   . Neuropathy associated with endocrine disorder ([place])   . Nonobstructive CAD (coronary artery disease)    a. 2012 Cath: mod, nonobs dzs; b. 10/2016 MV: EF 60%, no ischemia.  . OSA on CPAP 05/05/2017   Unattended Home Sleep Test 7/2/813-AHI 38.6/hour, desaturation to 64%, body weight 261 pounds  . PONV (postoperative nausea and vomiting)   . Sleep apnea     uses cpap asked to bring mask and tubing    Family History  Problem Relation Age of Onset  . Diabetes Mellitus II Mother   . Emphysema Father 63  . Heart attack Father   . Colon cancer Neg  Hx   . Esophageal cancer Neg Hx   . Rectal cancer Neg Hx   . Stomach cancer Neg Hx     Past Surgical History:  Procedure Laterality Date  . ABDOMINAL AORTOGRAM W/LOWER EXTREMITY N/A 12/10/2020   Procedure: ABDOMINAL AORTOGRAM W/LOWER EXTREMITY;  Surgeon: Cherre Robins, MD;  Location: [facility] [facility];  Service: Cardiovascular;  Laterality: N/A;  . AMPUTATION Right 01/22/2021   Procedure: 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;  Surgeon: [name], MD;  Location: [place];  Service: Orthopedics;  Laterality: Right;  . ANKLE FUSION Right 01/22/2021   Procedure: RIGHT FOOT TIBIOCALCANEAL FUSION;  Surgeon: [name], MD;  Location: [place];  Service: Orthopedics;  Laterality: Right;  . ANTERIOR FUSION CERVICAL SPINE  2012  . CARDIAC CATHETERIZATION  2011  . CARDIAC CATHETERIZATION N/A 11/09/2016   Procedure: Right Heart Cath;  Surgeon: Belva Crome, MD;  Location: [place] [facility];  Service: Cardiovascular;  Laterality: N/A;  . carpel tunnel     left wrist  . CATARACT EXTRACTION    . CATARACT EXTRACTION W/ INTRAOCULAR LENS  IMPLANT, BILATERAL  2013  . CERVICAL LAMINECTOMY  2012  . COLONOSCOPY N/A 01/14/2013   Procedure: COLONOSCOPY;  Surgeon: Irene Shipper, MD;  Location: [facility];  Service: Endoscopy;  Laterality: N/A;  .  CORONARY ARTERY BYPASS GRAFT  11/04/2020   LIMA-LAD, SVG-OM1, SVG-PDA (Dr [facility]) dc 11/18/2020  . EYE SURGERY    . I & D EXTREMITY Right 02/19/2021   Procedure: RIGHT ANKLE DEBRIDEMENT AND PLACEMENT ANTIBIOTIC BEADS;  Surgeon: [name], MD;  Location: [place];  Service: Orthopedics;  Laterality: Right;  . [place]   left  . LEFT HEART CATH AND CORONARY ANGIOGRAPHY N/A 07/10/2020   Procedure: LEFT HEART CATH AND CORONARY ANGIOGRAPHY;  Surgeon: Sherren Mocha, MD;  Location: [place] [facility];  Service: Cardiovascular;  Laterality: N/A;  . LUMBAR LAMINECTOMY  2003  . LUNG BIOPSY Left 12/26/2016   Procedure: LUNG BIOPSY;  Surgeon: [name], MD;  Location: [place];  Service: Thoracic;  Laterality: Left;  . PACEMAKER IMPLANT N/A 03/30/2020   Procedure: PACEMAKER IMPLANT;  Surgeon: Evans Lance, MD;  Location: [place] [facility];  Service: Cardiovascular;  Laterality: N/A;  . PERIPHERAL VASCULAR INTERVENTION Right 12/10/2020   Procedure: PERIPHERAL VASCULAR INTERVENTION;  Surgeon: Cherre Robins, MD;  Location: [place] [facility];  Service: Cardiovascular;  Laterality: Right;  SFA  . POSTERIOR FUSION CERVICAL SPINE  2012  . PPM GENERATOR REMOVAL N/A 12/14/2020   Procedure: PPM GENERATOR REMOVAL;  Surgeon: Evans Lance, MD;  Location: [place] [facility];  Service: Cardiovascular;  Laterality: N/A;  . TEE WITHOUT CARDIOVERSION N/A 12/11/2020   Procedure: TRANSESOPHAGEAL ECHOCARDIOGRAM (TEE);  Surgeon: Geralynn Rile, MD;  Location: [place];  Service: Cardiovascular;  Laterality: N/A;  . TRIGGER FINGER RELEASE  2011   4th finger left hand  . VIDEO ASSISTED THORACOSCOPY Left 12/26/2016   Procedure: VIDEO ASSISTED THORACOSCOPY;  Surgeon: [name], MD;  Location: [place];  Service: Thoracic;  Laterality: Left;  Marland Kitchen VIDEO BRONCHOSCOPY N/A 12/26/2016   Procedure: VIDEO BRONCHOSCOPY;  Surgeon: [name], MD;  Location: [place];  Service:  Thoracic;  Laterality: N/A;   Social History   Occupational History  . Not on file  Tobacco Use  . Smoking status: Never Smoker  . Smokeless tobacco: Never Used  Vaping Use  . Vaping Use: Never  used  Substance and Sexual Activity  . Alcohol use: No  . Drug use: No  . Sexual activity: Not Currently

## 2021-02-25 ENCOUNTER — Other Ambulatory Visit: Payer: Self-pay | Admitting: Orthopedic Surgery

## 2021-02-25 ENCOUNTER — Encounter: Payer: Self-pay | Admitting: Orthopedic Surgery

## 2021-02-25 ENCOUNTER — Ambulatory Visit (INDEPENDENT_AMBULATORY_CARE_PROVIDER_SITE_OTHER): Payer: Medicare Other | Admitting: Orthopedic Surgery

## 2021-02-25 DIAGNOSIS — Z981 Arthrodesis status: Secondary | ICD-10-CM

## 2021-02-25 DIAGNOSIS — L03115 Cellulitis of right lower limb: Secondary | ICD-10-CM

## 2021-02-25 DIAGNOSIS — Z89421 Acquired absence of other right toe(s): Secondary | ICD-10-CM

## 2021-02-25 NOTE — Progress Notes (Signed)
Office Visit Note   Patient: Shane [name]           Date of Birth: 03-22-1941           MRN: [id number] Visit Date: 02/25/2021              Requested by: Burnard Bunting, MD [address] [place],  [place] [address] PCP: Burnard Bunting, MD  No chief complaint on file.     HPI: Patient is a 80 year old gentleman who is 1 week status post surgery.  He had infection of the anterior hardware which was removed one intramedullary screw was left in place antibiotic beads were placed cultures were pansensitive and is currently on Cipro for bone penetration.  Patient is using Vicodin for pain and is very lethargic.  Patient has home health nursing and home health therapy coming to his home patient's wife states that his sacral decubitus ulcer is getting worse.  Assessment & Plan: Visit Diagnoses:  1. Cellulitis of right foot   2. S/P ankle fusion   3. 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  Follow-Up Instructions: Return in about 1 week (around 03/04/2021).   Ortho Exam  Patient is alert, oriented, no adenopathy, well-dressed, normal affect, normal respiratory effort. Examination the wound VAC dressing is removed the first metatarsal head ulcer medially has 100% healthy granulation tissue is flat and is very healthy this is 3 cm in diameter.  The heel decubitus ulcer is showing increased breakdown the dry eschar has softened this is removed there is ischemic changes over the entire heel ulcer that is about 5 cm in diameter there is no deep wound or tunneling.  The lateral fifth metatarsal amputation incision has dry eschar with no further breakdown.  The wound is flat.  The dorsal skin incision of the ankle is well  approximated there is one antibiotic bead visible within the wound the surrounding cellulitis has completely resolved.  Imaging: No results found. No images are attached to the encounter.  Labs: Lab Results  Component Value Date   HGBA1C 7.8 (H) 02/15/2021   HGBA1C 6.6 (H) 12/09/2020   HGBA1C 7.6 (H) 03/28/2020   ESRSEDRATE 22 (H) 10/14/2016   CRP 8.3 (H) 12/09/2020   CRP 4.1 (H) 07/17/2020   REPTSTATUS 02/22/2021 FINAL 02/19/2021   GRAMSTAIN  02/19/2021    RARE WBC PRESENT, PREDOMINANTLY PMN NO ORGANISMS SEEN    CULT  02/19/2021    FEW ESCHERICHIA COLI NO ANAEROBES ISOLATED Performed at [facility], [place] [address] [place],  [address]    LABORGA ESCHERICHIA COLI 02/19/2021     Lab Results  Component Value Date   ALBUMIN 2.6 (L) 02/15/2021   ALBUMIN 2.0 (L) 12/16/2020   ALBUMIN 2.5 (L) 12/09/2020    Lab Results  Component Value Date   MG 1.9 02/20/2021   MG 2.2 02/19/2021   MG 2.3 02/18/2021   No results found for: VD25OH  No results found for: PREALBUMIN CBC EXTENDED Latest Ref Rng & Units 02/21/2021 02/20/2021 02/19/2021  WBC 4.0 - 10.5 K/uL 12.3(H) 9.4 -  RBC 4.22 - 5.81 MIL/uL 3.10(L) 3.11(L) -  HGB 13.0 - 17.0 g/dL 9.0(L) 8.8(L) 8.8(L)  HCT 39.0 - 52.0 % 27.2(L)  27.5(L) 26.0(L)  PLT 150 - 400 K/uL 238 221 -  NEUTROABS 1.7 - 7.7 K/uL 10.3(H) - -  LYMPHSABS 0.7 - 4.0 K/uL 0.9 - -     There is no height or weight on file to calculate BMI.  Orders:  No orders of the defined types were placed in this encounter.  No orders of the defined types were placed in this encounter.    Procedures: No procedures performed  Clinical Data: No additional findings.  ROS:  All other systems negative, except as noted in the HPI. Review of Systems  Objective: Vital Signs: There were no vitals taken for this visit.  Specialty Comments:  No specialty comments available.  PMFS History: Patient Active Problem List   Diagnosis Date Noted  .  Hardware complicating wound infection ([place])   . Abscess of bursa of right ankle 02/15/2021  . 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 Peripheral vascular disease ([place]) 12/29/2020  . CHF (congestive heart failure), NYHA class II, chronic, diastolic ([place]) 15/72/6203  . History of COVID-19 12/22/2020  . SSS (sick sinus syndrome) ([place]) 12/22/2020  . Acute bacterial endocarditis   . MSSA bacteremia 12/09/2020  . Diabetic foot ulcer ([place]) 12/09/2020  . Foot drop, right foot   . Generalized weakness 12/08/2020  . Dehydration with hyponatremia 12/08/2020  . Atrial fibrillation, chronic ([place]) 12/08/2020  . Elevated troponin level not due myocardial infarction 12/08/2020  . Adrenal insufficiency ([place]) 11/14/2020  . Orthostatic hypotension 11/14/2020  . Physical deconditioning 11/14/2020  . Difficulty sleeping 11/07/2020  . Postoperative anemia due to acute blood loss 11/07/2020  . Right ventricular dysfunction 11/05/2020  . 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 Hearing loss 11/03/2020  . Cardiac device in situ 09/09/2020  . Coronary atherosclerosis due to lipid rich plaque 09/02/2020  . COVID-19 virus infection 07/18/2020  . Coronary artery disease involving native heart without angina pectoris 07/10/2020  . Chronic kidney disease, stage 3a ([place]) 03/29/2020  . 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 ([place]) 03/16/2020  . Right epiretinal membrane 03/16/2020  . Posterior vitreous detachment of right eye 03/16/2020  . Aortic stenosis, moderate 03/12/2020  . 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 Near syncope 04/27/2018  . Hypoglycemia due to insulin 01/06/2018  . Essential hypertension 01/06/2018  . Diabetes mellitus type 2, with complication, on long term insulin pump ([place]) 01/06/2018  . Syncope  and collapse 01/05/2018  . Encounter for therapeutic drug monitoring 06/29/2017  . OSA on CPAP 05/05/2017  . 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[place]) 01/05/2017  . Abnormal chest x-ray 10/12/2016  . Benign neoplasm of colon 01/14/2013   Past Medical History:  Diagnosis Date  . 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 Cataract   . Chronic kidney disease    d/t DM  . CKD (chronic kidney disease), stage III ([place])   . Diabetes mellitus    Vgo disposal insulin bolus  simular to insulin pump  . Dyspnea   . GERD (gastroesophageal reflux disease)   . History of kidney stones    passed  . Hyperlipidemia   . Hypertension   . Idiopathic pulmonary fibrosis ([place]) 11/2016  . ILD (interstitial lung disease) ([place])   . Moderate aortic stenosis    a. 10/2019 Echo: EF 55-60%, Gr2 DD. Nl RV.   Marland Kitchen Neuromuscular disorder ([place])   .  Neuropathy associated with endocrine disorder ([place])   . Nonobstructive CAD (coronary artery disease)    a. 2012 Cath: mod, nonobs dzs; b. 10/2016 MV: EF 60%, no ischemia.  . OSA on CPAP 05/05/2017   Unattended Home Sleep Test 7/2/813-AHI 38.6/hour, desaturation to 64%, body weight 261 pounds  . PONV (postoperative nausea and vomiting)   . Sleep apnea     uses cpap asked to bring mask and tubing    Family History  Problem Relation Age of Onset  . Diabetes Mellitus II Mother   . Emphysema Father 68  . Heart attack Father   . Colon cancer Neg Hx   . Esophageal cancer Neg Hx   . Rectal cancer Neg Hx   . Stomach cancer Neg Hx     Past Surgical History:  Procedure Laterality Date  . ABDOMINAL AORTOGRAM W/LOWER EXTREMITY N/A 12/10/2020   Procedure: ABDOMINAL AORTOGRAM W/LOWER EXTREMITY;  Surgeon: Cherre Robins, MD;  Location: [facility];  Service: Cardiovascular;  Laterality: N/A;  . AMPUTATION Right 01/22/2021   Procedure: 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;  Surgeon: [name], MD;  Location: [facility];  Service: Orthopedics;  Laterality: Right;  . ANKLE FUSION Right 01/22/2021    Procedure: RIGHT FOOT TIBIOCALCANEAL FUSION;  Surgeon: [name], MD;  Location: [facility];  Service: Orthopedics;  Laterality: Right;  . ANTERIOR FUSION CERVICAL SPINE  2012  . CARDIAC CATHETERIZATION  2011  . CARDIAC CATHETERIZATION N/A 11/09/2016   Procedure: Right Heart Cath;  Surgeon: Belva Crome, MD;  Location: [facility];  Service: Cardiovascular;  Laterality: N/A;  . carpel tunnel     left wrist  . CATARACT EXTRACTION    . CATARACT EXTRACTION W/ INTRAOCULAR LENS  IMPLANT, BILATERAL  2013  . CERVICAL LAMINECTOMY  2012  . COLONOSCOPY N/A 01/14/2013   Procedure: COLONOSCOPY;  Surgeon: Irene Shipper, MD;  [facility];  Service: Endoscopy;  Laterality: N/A;  . CORONARY ARTERY BYPASS GRAFT  11/04/2020   LIMA-LAD, SVG-OM1, SVG-PDA (Dr Marney Setting [facility]) dc 11/18/2020  . EYE SURGERY    . I & D EXTREMITY Right 02/19/2021   Procedure: RIGHT ANKLE DEBRIDEMENT AND PLACEMENT ANTIBIOTIC BEADS;  Surgeon: [name], MD;  Location: [facility];  Service: Orthopedics;  Laterality: Right;  . [place]   left  . LEFT HEART CATH AND CORONARY ANGIOGRAPHY N/A 07/10/2020   Procedure: LEFT HEART CATH AND CORONARY ANGIOGRAPHY;  Surgeon: Sherren Mocha, MD;  Location: [facility];  Service: Cardiovascular;  Laterality: N/A;  . LUMBAR LAMINECTOMY  2003  . LUNG BIOPSY Left 12/26/2016   Procedure: LUNG BIOPSY;  Surgeon: [name], MD;  Location: [facility];  Service: Thoracic;  Laterality: Left;  . PACEMAKER IMPLANT N/A 03/30/2020   Procedure: PACEMAKER IMPLANT;  Surgeon: Evans Lance, MD;  Location: [facility];  Service: Cardiovascular;  Laterality: N/A;  . PERIPHERAL VASCULAR INTERVENTION Right 12/10/2020   Procedure: PERIPHERAL VASCULAR INTERVENTION;  Surgeon: Cherre Robins, MD;  Location: [facility];  Service: Cardiovascular;  Laterality: Right;  SFA  . POSTERIOR FUSION CERVICAL SPINE  2012  . PPM GENERATOR REMOVAL N/A 12/14/2020   Procedure: PPM  GENERATOR REMOVAL;  Surgeon: Evans Lance, MD;  Location: [facility];  Service: Cardiovascular;  Laterality: N/A;  . TEE WITHOUT CARDIOVERSION N/A 12/11/2020   Procedure: TRANSESOPHAGEAL ECHOCARDIOGRAM (TEE);  Surgeon: Geralynn Rile, MD;  Location: [facility];  Service:  Cardiovascular;  Laterality: N/A;  . TRIGGER FINGER RELEASE  2011   4th finger left hand  . VIDEO ASSISTED THORACOSCOPY Left 12/26/2016   Procedure: VIDEO ASSISTED THORACOSCOPY;  Surgeon: [name], MD;  Location: [facility];  Service: Thoracic;  Laterality: Left;  Marland Kitchen VIDEO BRONCHOSCOPY N/A 12/26/2016   Procedure: VIDEO BRONCHOSCOPY;  Surgeon: [name], MD;  Location: [facility];  Service: Thoracic;  Laterality: N/A;   Social History   Occupational History  . Not on file  Tobacco Use  . Smoking status: Never Smoker  . Smokeless tobacco: Never Used  Vaping Use  . Vaping Use: Never used  Substance and Sexual Activity  . Alcohol use: No  . Drug use: No  . Sexual activity: Not Currently

## 2021-02-26 ENCOUNTER — Telehealth: Payer: Self-pay | Admitting: Orthopedic Surgery

## 2021-02-26 ENCOUNTER — Telehealth: Payer: Self-pay

## 2021-02-26 ENCOUNTER — Other Ambulatory Visit: Payer: Self-pay

## 2021-02-26 NOTE — Telephone Encounter (Signed)
Order for daily dry dressing change foot and ankle, continue with fx boot and float heel as per dictation from office visit yesterday faxed as requested.

## 2021-02-26 NOTE — Telephone Encounter (Signed)
Angel from [facility] called. She would like wound care orders faxed to [phone]. Her call back number is [phone]

## 2021-02-26 NOTE — Telephone Encounter (Signed)
Herbert Deaner [phone] Would like to know weight bearing status and Requesting verbal PT orders 1 week 1 2 week 2 1 week 2  Please cb and advise thank you

## 2021-03-02 NOTE — Telephone Encounter (Signed)
I called and lm on vm to advise that the pt is to be non weight bearing on the right and to wear his fx boot and float the heels while at rest. Verbal ok for PT as long as the pt is non weight bearing on the right.

## 2021-03-04 ENCOUNTER — Telehealth: Payer: Self-pay

## 2021-03-04 ENCOUNTER — Ambulatory Visit (INDEPENDENT_AMBULATORY_CARE_PROVIDER_SITE_OTHER): Payer: Medicare Other | Admitting: Orthopedic Surgery

## 2021-03-04 ENCOUNTER — Telehealth: Payer: Self-pay | Admitting: Orthopedic Surgery

## 2021-03-04 ENCOUNTER — Other Ambulatory Visit: Payer: Self-pay | Admitting: Orthopedic Surgery

## 2021-03-04 ENCOUNTER — Encounter: Payer: Self-pay | Admitting: Orthopedic Surgery

## 2021-03-04 DIAGNOSIS — Z981 Arthrodesis status: Secondary | ICD-10-CM

## 2021-03-04 DIAGNOSIS — L03115 Cellulitis of right lower limb: Secondary | ICD-10-CM

## 2021-03-04 MED ORDER — HYDROCODONE-ACETAMINOPHEN 5-325 MG PO TABS
1.0000 | ORAL_TABLET | ORAL | 0 refills | Status: DC | PRN
Start: 2021-03-04 — End: 2021-03-26

## 2021-03-04 MED ORDER — ONDANSETRON HCL 4 MG PO TABS: ORAL_TABLET | ORAL | 0 refills | Status: DC

## 2021-03-04 NOTE — Telephone Encounter (Signed)
Rx sent 

## 2021-03-04 NOTE — Telephone Encounter (Signed)
Pt called asking Dr. Sharol Given call in his zofran and norco rx please, he would like a CB when that's been done.   [phone]

## 2021-03-04 NOTE — Telephone Encounter (Signed)
Pt would like a refill on the hydrocodone and Zofran

## 2021-03-04 NOTE — Telephone Encounter (Signed)
Can you please refill? Thanks!

## 2021-03-04 NOTE — Progress Notes (Signed)
Office Visit Note   Patient: Shane [name]           Date of Birth: 1941-03-09           MRN: [id number] Visit Date: 03/04/2021              Requested by: Burnard Bunting, MD [address] [place],  [place] [address] PCP: Burnard Bunting, MD  Chief Complaint  Patient presents with  . Right Ankle - Follow-up      HPI: Patient is seen in follow-up for the right lower extremity he is status post debridement and hardware removal 2 weeks ago on the right ankle.  Patient states he started having some drainage over the inferior aspect of the incision.  Patient is still on his Cipro.  Assessment & Plan: Visit Diagnoses:  1. S/P ankle fusion   2. Cellulitis of right foot     Plan: Continue with Dial soap cleansing daily continue with elevation continue with the fracture boot continue with dry dressing changes daily.  Discussed that we need to see how this drainage progresses as well as the eschar on the heel and the lateral aspect of the foot.  The heel eschar and the lateral foot eschar appear larger and thicker today.  Follow-Up Instructions: Return in about 1 week (around 03/11/2021).   Ortho Exam  Patient is alert, oriented, no adenopathy, well-dressed, normal affect, normal respiratory effort. Examination patient surgical incision anteriorly over the ankle is well approximated proximally.  Distally there is a small open wound with clear serous drainage.  The decubitus heel ulcer is thicker and appears full-thickness at this time.  The first metatarsal head eschar appears superficial.  The lateral foot ulcer eschar appears thicker but there is no cellulitis no drainage no exposed bone.  Patient states he may have bumped his left foot on a heater under his office chair.  There appears to be a small skin tear with a blister.  There is no full-thickness skin loss no evidence of a burn.  Patient will use a postoperative shoe on the left foot to unload pressure from the great toe change  a Band-Aid daily.  Imaging: No results found. No images are attached to the encounter.  Labs: Lab Results  Component Value Date   HGBA1C 7.8 (H) 02/15/2021   HGBA1C 6.6 (H) 12/09/2020   HGBA1C 7.6 (H) 03/28/2020   ESRSEDRATE 22 (H) 10/14/2016   CRP 8.3 (H) 12/09/2020   CRP 4.1 (H) 07/17/2020   REPTSTATUS 02/22/2021 FINAL 02/19/2021   GRAMSTAIN  02/19/2021    RARE WBC PRESENT, PREDOMINANTLY PMN NO ORGANISMS SEEN    CULT  02/19/2021    FEW ESCHERICHIA COLI NO ANAEROBES ISOLATED Performed at [facility], [place] [address] [place], [place] [address]    LABORGA ESCHERICHIA COLI 02/19/2021     Lab Results  Component Value Date   ALBUMIN 2.6 (L) 02/15/2021   ALBUMIN 2.0 (L) 12/16/2020   ALBUMIN 2.5 (L) 12/09/2020    Lab Results  Component Value Date   MG 1.9 02/20/2021   MG 2.2 02/19/2021   MG 2.3 02/18/2021   No results found for: VD25OH  No results found for: PREALBUMIN CBC EXTENDED Latest Ref Rng & Units 02/21/2021 02/20/2021 02/19/2021  WBC 4.0 - 10.5 K/uL 12.3(H) 9.4 -  RBC 4.22 - 5.81 MIL/uL 3.10(L) 3.11(L) -  HGB 13.0 - 17.0 g/dL 9.0(L) 8.8(L) 8.8(L)  HCT 39.0 - 52.0 % 27.2(L) 27.5(L) 26.0(L)  PLT 150 - 400  K/uL 238 221 -  NEUTROABS 1.7 - 7.7 K/uL 10.3(H) - -  LYMPHSABS 0.7 - 4.0 K/uL 0.9 - -     There is no height or weight on file to calculate BMI.  Orders:  No orders of the defined types were placed in this encounter.  No orders of the defined types were placed in this encounter.    Procedures: No procedures performed  Clinical Data: No additional findings.  ROS:  All other systems negative, except as noted in the HPI. Review of Systems  Objective: Vital Signs: There were no vitals taken for this visit.  Specialty Comments:  No specialty comments available.  PMFS History: Patient Active Problem List   Diagnosis Date Noted  . Hardware complicating wound infection ([place])   . Abscess of bursa of right ankle 02/15/2021  .  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 Peripheral vascular disease ([place]) 12/29/2020  . CHF (congestive heart failure), NYHA class II, chronic, diastolic ([place]) 06/03/1600  . History of COVID-19 12/22/2020  . SSS (sick sinus syndrome) ([place]) 12/22/2020  . Acute bacterial endocarditis   . MSSA bacteremia 12/09/2020  . Diabetic foot ulcer ([place]) 12/09/2020  . Foot drop, right foot   . Generalized weakness 12/08/2020  . Dehydration with hyponatremia 12/08/2020  . Atrial fibrillation, chronic ([place]) 12/08/2020  . Elevated troponin level not due myocardial infarction 12/08/2020  . Adrenal insufficiency ([place]) 11/14/2020  . Orthostatic hypotension 11/14/2020  . Physical deconditioning 11/14/2020  . Difficulty sleeping 11/07/2020  . Postoperative anemia due to acute blood loss 11/07/2020  . Right ventricular dysfunction 11/05/2020  . 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 Hearing loss 11/03/2020  . Cardiac device in situ 09/09/2020  . Coronary atherosclerosis due to lipid rich plaque 09/02/2020  . COVID-19 virus infection 07/18/2020  . Coronary artery disease involving native heart without angina pectoris 07/10/2020  . Chronic kidney disease, stage 3a ([place]) 03/29/2020  . 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 ([place]) 03/16/2020  . Right epiretinal membrane 03/16/2020  . Posterior vitreous detachment of right eye 03/16/2020  . Aortic stenosis, moderate 03/12/2020  . 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 Near syncope 04/27/2018  . Hypoglycemia due to insulin 01/06/2018  . Essential hypertension 01/06/2018  . Diabetes mellitus type 2, with complication, on long term insulin pump ([place]) 01/06/2018  . Syncope and collapse 01/05/2018  . Encounter for therapeutic drug monitoring 06/29/2017  . OSA on CPAP  05/05/2017  . 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[place]) 01/05/2017  . Abnormal chest x-ray 10/12/2016  . Benign neoplasm of colon 01/14/2013   Past Medical History:  Diagnosis Date  . 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 Cataract   . Chronic kidney disease    d/t DM  . CKD (chronic kidney disease), stage III ([place])   . Diabetes mellitus    Vgo disposal insulin bolus  simular to insulin pump  . Dyspnea   . GERD (gastroesophageal reflux disease)   . History of kidney stones    passed  . Hyperlipidemia   . Hypertension   . Idiopathic pulmonary fibrosis ([place]) 11/2016  . ILD (interstitial lung disease) ([place])   . Moderate aortic stenosis    a. 10/2019 Echo: EF 55-60%, Gr2 DD. Nl RV.   Marland Kitchen Neuromuscular disorder ([place])   . Neuropathy associated with endocrine  disorder ([place])   . Nonobstructive CAD (coronary artery disease)    a. 2012 Cath: mod, nonobs dzs; b. 10/2016 MV: EF 60%, no ischemia.  . OSA on CPAP 05/05/2017   Unattended Home Sleep Test 7/2/813-AHI 38.6/hour, desaturation to 64%, body weight 261 pounds  . PONV (postoperative nausea and vomiting)   . Sleep apnea     uses cpap asked to bring mask and tubing    Family History  Problem Relation Age of Onset  . Diabetes Mellitus II Mother   . Emphysema Father 41  . Heart attack Father   . Colon cancer Neg Hx   . Esophageal cancer Neg Hx   . Rectal cancer Neg Hx   . Stomach cancer Neg Hx     Past Surgical History:  Procedure Laterality Date  . ABDOMINAL AORTOGRAM W/LOWER EXTREMITY N/A 12/10/2020   Procedure: ABDOMINAL AORTOGRAM W/LOWER EXTREMITY;  Surgeon: Cherre Robins, MD;  Location: [facility];  Service: Cardiovascular;  Laterality: N/A;  . AMPUTATION Right 01/22/2021   Procedure: 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;  Surgeon: [name], MD;  Location: [facility];  Service: Orthopedics;  Laterality: Right;  . ANKLE FUSION Right 01/22/2021   Procedure: RIGHT FOOT TIBIOCALCANEAL FUSION;  Surgeon: [name], MD;  Location: [facility];  Service:  Orthopedics;  Laterality: Right;  . ANTERIOR FUSION CERVICAL SPINE  2012  . CARDIAC CATHETERIZATION  2011  . CARDIAC CATHETERIZATION N/A 11/09/2016   Procedure: Right Heart Cath;  Surgeon: Belva Crome, MD;  Location: [facility];  Service: Cardiovascular;  Laterality: N/A;  . carpel tunnel     left wrist  . CATARACT EXTRACTION    . CATARACT EXTRACTION W/ INTRAOCULAR LENS  IMPLANT, BILATERAL  2013  . CERVICAL LAMINECTOMY  2012  . COLONOSCOPY N/A 01/14/2013   Procedure: COLONOSCOPY;  Surgeon: Irene Shipper, MD;  [facility];  Service: Endoscopy;  Laterality: N/A;  . CORONARY ARTERY BYPASS GRAFT  11/04/2020   LIMA-LAD, SVG-OM1, SVG-PDA (Dr Marney Setting [facility]) dc 11/18/2020  . EYE SURGERY    . I & D EXTREMITY Right 02/19/2021   Procedure: RIGHT ANKLE DEBRIDEMENT AND PLACEMENT ANTIBIOTIC BEADS;  Surgeon: [name], MD;  Location: [facility];  Service: Orthopedics;  Laterality: Right;  . [place]   left  . LEFT HEART CATH AND CORONARY ANGIOGRAPHY N/A 07/10/2020   Procedure: LEFT HEART CATH AND CORONARY ANGIOGRAPHY;  Surgeon: Sherren Mocha, MD;  Location: [facility];  Service: Cardiovascular;  Laterality: N/A;  . LUMBAR LAMINECTOMY  2003  . LUNG BIOPSY Left 12/26/2016   Procedure: LUNG BIOPSY;  Surgeon: [name], MD;  Location: [facility];  Service: Thoracic;  Laterality: Left;  . PACEMAKER IMPLANT N/A 03/30/2020   Procedure: PACEMAKER IMPLANT;  Surgeon: Evans Lance, MD;  Location: [facility];  Service: Cardiovascular;  Laterality: N/A;  . PERIPHERAL VASCULAR INTERVENTION Right 12/10/2020   Procedure: PERIPHERAL VASCULAR INTERVENTION;  Surgeon: Cherre Robins, MD;  Location: [facility];  Service: Cardiovascular;  Laterality: Right;  SFA  . POSTERIOR FUSION CERVICAL SPINE  2012  . PPM GENERATOR REMOVAL N/A 12/14/2020   Procedure: PPM GENERATOR REMOVAL;  Surgeon: Evans Lance, MD;  Location: [facility];  Service: Cardiovascular;   Laterality: N/A;  . TEE WITHOUT CARDIOVERSION N/A 12/11/2020   Procedure: TRANSESOPHAGEAL ECHOCARDIOGRAM (TEE);  Surgeon: Geralynn Rile, MD;  Location: [facility];  Service: Cardiovascular;  Laterality: N/A;  .  TRIGGER FINGER RELEASE  2011   4th finger left hand  . VIDEO ASSISTED THORACOSCOPY Left 12/26/2016   Procedure: VIDEO ASSISTED THORACOSCOPY;  Surgeon: [name], MD;  Location: [facility];  Service: Thoracic;  Laterality: Left;  Marland Kitchen VIDEO BRONCHOSCOPY N/A 12/26/2016   Procedure: VIDEO BRONCHOSCOPY;  Surgeon: [name], MD;  Location: [facility];  Service: Thoracic;  Laterality: N/A;   Social History   Occupational History  . Not on file  Tobacco Use  . Smoking status: Never Smoker  . Smokeless tobacco: Never Used  Vaping Use  . Vaping Use: Never used  Substance and Sexual Activity  . Alcohol use: No  . Drug use: No  . Sexual activity: Not Currently

## 2021-03-08 ENCOUNTER — Ambulatory Visit: Payer: Medicare Other | Admitting: Cardiology

## 2021-03-09 ENCOUNTER — Ambulatory Visit (HOSPITAL_COMMUNITY)
Admission: RE | Admit: 2021-03-09 | Discharge: 2021-03-09 | Disposition: A | Discharge status: Home / Self Care | Payer: Medicare Other | Source: Ambulatory Visit | Attending: Internal Medicine | Admitting: Internal Medicine

## 2021-03-09 ENCOUNTER — Encounter: Payer: Medicare Other | Admitting: *Deleted

## 2021-03-09 ENCOUNTER — Other Ambulatory Visit: Payer: Self-pay

## 2021-03-09 DIAGNOSIS — Z006 Encounter for examination for normal comparison and control in clinical research program: Secondary | ICD-10-CM

## 2021-03-09 DIAGNOSIS — J84112 Idiopathic pulmonary fibrosis: Secondary | ICD-10-CM

## 2021-03-09 MED ORDER — STUDY - FIBROGEN - PAMREVLUMAB 10 MG/ML IV INFUSION (OPEN LABEL) (PI-RAMASWAMY)
30.0000 mg/kg | Freq: Once | INTRAVENOUS | Status: AC
  Administered 2021-03-09: 3650 mg via INTRAVENOUS
  Filled 2021-03-09: qty 365

## 2021-03-09 NOTE — Research (Signed)
Title: [id number] Open-Label Extension (OLE) is a multi-center, single-arm, open-label extension (OLE) phase where subjects who complete the Week 48 visit of the main study are eligible to participate. The extension to the main study allows for the continued evaluation of the efficacy and safety of 30 mg/kg IV infusions of pamrevlumab administered every 3 weeks for 52 weeks in subjects with Idiopathic Pulmonary Fibrosis.  Primary endpoint is: To provide continued access to pamrevlumab in subjects with IPF who completed the Week 48 visit of the main study  Protocol #: [id number], Clinical Trials #: [id number] Sponsor: [email or web address]  ([place], [place], [place])  Scientist, physiological / Electrical engineer note : This visit for Subject Shane [name] with DOB: 09-11-1941 on 03/09/2021 for the above protocol is Visit/Encounter #EX Week 69  and is for purpose of research.   The consent for this encounter is under Protocol Version Amendment 5.0 (Dated 26AUG2021) , Investigator Brochure Edition version 19 dated 09 Oct 2020 Consent Version 4.0, revised 21JUL2021and is currently IRB approved.    Subject expressed continued interest and consent in continuing as a study subject. Subject confirmed that there was no change in contact information (e.g. address, telephone, email). Subject thanked for participation in research and contribution to science.  In this visit 03/09/2021 the subject is not required to be evaluated by an investigator.  All study visit EX week 69 procedures and assessments were completed per the above mentioned protocol. Study drug was infused without problems. Please refer to subject's paper source binder for further documentation of the visit. The subject will return in approximately 3 weeks for his next infusion.  Signed by [place] Assistant [facility]  [place], [place] 1:02 PM 03/09/2021

## 2021-03-09 NOTE — Research (Signed)
IPF-PRO Registry  Purpose: To collect data and biological samples that will support future research studies.  The goal of the registry is to research the current approaches to diagnosis, treatment, and progression of IPF. In addition, the registry will analyze participant characteristics, assess quality of life, describe participants interactions with the health care system, determine IPF treatment practices across multiple institutions, and utilize biological samples to identify disease biomarkers.   Clinical Research Coordinator / Research RN note : This visit for Subject Shane [name] with DOB: 08-27-1941 on 03/09/2021 for the above protocol is Visit/Encounter 8- 48 month follow-up and is for purpose of research.   The consent for this encounter is under Protocol Version Amendment 4 [phone]), is currently IRB approved.   Subject expressed continued interest and consent in continuing as a study subject. Subject confirmed that there was no change in contact information (e.g. address, telephone, email). Subject thanked for participation in research and contribution to science.   During this visit, the subject completed the blood work and questionnaires per the above referenced protocol. Please refer to the subject's paper source binder for further details.    Signed by  [place] Coordinator  [facility]  [place], [place] 1:35 PM 03/09/2021

## 2021-03-11 ENCOUNTER — Ambulatory Visit (INDEPENDENT_AMBULATORY_CARE_PROVIDER_SITE_OTHER): Payer: Medicare Other | Admitting: Orthopedic Surgery

## 2021-03-11 DIAGNOSIS — Z981 Arthrodesis status: Secondary | ICD-10-CM

## 2021-03-11 DIAGNOSIS — Z89421 Acquired absence of other right toe(s): Secondary | ICD-10-CM

## 2021-03-11 DIAGNOSIS — I96 Gangrene, not elsewhere classified: Secondary | ICD-10-CM

## 2021-03-12 ENCOUNTER — Encounter: Payer: Self-pay | Admitting: Orthopedic Surgery

## 2021-03-12 NOTE — Progress Notes (Signed)
Office Visit Note   Patient: Shane [name]           Date of Birth: [birth year]           MRN: [id number] Visit Date: 03/11/2021              Requested by: Burnard Bunting, MD [address] [place],  [place] [address] PCP: Burnard Bunting, MD  Chief Complaint  Patient presents with  . Right Ankle - Routine Post Op    02/19/21 right ankle deb and ABX beads       HPI: Patient is a 80 year old gentleman who presents in follow-up status post right foot salvage intervention.  Patient underwent 1/5 ray amputation and with the fixed cavovarus deformity of the ankle he underwent ankle fusion to provide him a plantigrade foot.  Patient has had progressive ischemic changes to his foot with a progressive ischemic changes over the fifth ray amputation a large decubitus dry gangrenous ulcer as well as an ulcer over the first metatarsal head.  There is also serosanguineous drainage from the ankle fusion incision.  Assessment & Plan: Visit Diagnoses:  1. 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 S/P ankle fusion   3. Gangrene of right foot ([place])     Plan: Discussed with the patient the progressive gangrenous changes of the lateral aspect of the right foot with exposed bone and the full-thickness ischemic ulcer over the heel and slow healing of the anterior incision patient has progressive worsening of the foot and ankle secondary to his microcirculatory disease.  Have recommended proceeding with a transtibial amputation.  Patient states he understands wishes to proceed with surgery next Wednesday.  I have called his primary care physician Dr. [name] and relayed the clinical findings.  Patient states he ideally would like to [facility].  Follow-Up Instructions: Return in about 1 week (around 03/18/2021).   Ortho Exam  Patient is alert, oriented, no adenopathy, well-dressed, normal affect, normal respiratory effort. Examination patient has  progressive gangrenous changes to the right heel with full-thickness dry gangrenous changes down to the calcaneus.  The lateral incision has progressive gangrenous changes as well with exposed bone and full-thickness necrotic changes.  The ischemic gangrenous ulcer over the MTP joint medially is resolving.  Patient has been on Cipro and has been undergoing physical therapy 2 times a week.  Imaging: No results found. No images are attached to the encounter.  Labs: Lab Results  Component Value Date   HGBA1C 7.8 (H) 02/15/2021   HGBA1C 6.6 (H) 12/09/2020   HGBA1C 7.6 (H) 03/28/2020   ESRSEDRATE 22 (H) 10/14/2016   CRP 8.3 (H) 12/09/2020   CRP 4.1 (H) 07/17/2020   REPTSTATUS 02/22/2021 FINAL 02/19/2021   GRAMSTAIN  02/19/2021    RARE WBC PRESENT, PREDOMINANTLY PMN NO ORGANISMS SEEN    CULT  02/19/2021    FEW ESCHERICHIA COLI NO ANAEROBES ISOLATED Performed at [facility], [place] [address] [place], [place] [address]    LABORGA ESCHERICHIA COLI 02/19/2021     Lab Results  Component Value Date   ALBUMIN 2.6 (L) 02/15/2021   ALBUMIN 2.0 (L) 12/16/2020   ALBUMIN 2.5 (L) 12/09/2020    Lab Results  Component Value Date   MG 1.9 02/20/2021   MG 2.2 02/19/2021   MG 2.3 02/18/2021   No results found for: VD25OH  No results found for: PREALBUMIN CBC EXTENDED Latest Ref Rng & Units 02/21/2021  02/20/2021 02/19/2021  WBC 4.0 - 10.5 K/uL 12.3(H) 9.4 -  RBC 4.22 - 5.81 MIL/uL 3.10(L) 3.11(L) -  HGB 13.0 - 17.0 g/dL 9.0(L) 8.8(L) 8.8(L)  HCT 39.0 - 52.0 % 27.2(L) 27.5(L) 26.0(L)  PLT 150 - 400 K/uL 238 221 -  NEUTROABS 1.7 - 7.7 K/uL 10.3(H) - -  LYMPHSABS 0.7 - 4.0 K/uL 0.9 - -     There is no height or weight on file to calculate BMI.  Orders:  No orders of the defined types were placed in this encounter.  No orders of the defined types were placed in this encounter.    Procedures: No procedures performed  Clinical Data: No additional findings.  ROS:  All other  systems negative, except as noted in the HPI. Review of Systems  Objective: Vital Signs: There were no vitals taken for this visit.  Specialty Comments:  No specialty comments available.  PMFS History: Patient Active Problem List   Diagnosis Date Noted  . Hardware complicating wound infection ([place])   . Abscess of bursa of right ankle 02/15/2021  . 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 Peripheral vascular disease ([place]) 12/29/2020  . CHF (congestive heart failure), NYHA class II, chronic, diastolic ([place]) 09/28/8526  . History of COVID-19 12/22/2020  . SSS (sick sinus syndrome) ([place]) 12/22/2020  . Acute bacterial endocarditis   . MSSA bacteremia 12/09/2020  . Diabetic foot ulcer ([place]) 12/09/2020  . Foot drop, right foot   . Generalized weakness 12/08/2020  . Dehydration with hyponatremia 12/08/2020  . Atrial fibrillation, chronic ([place]) 12/08/2020  . Elevated troponin level not due myocardial infarction 12/08/2020  . Adrenal insufficiency ([place]) 11/14/2020  . Orthostatic hypotension 11/14/2020  . Physical deconditioning 11/14/2020  . Difficulty sleeping 11/07/2020  . Postoperative anemia due to acute blood loss 11/07/2020  . Right ventricular dysfunction 11/05/2020  . 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 Hearing loss 11/03/2020  . Cardiac device in situ 09/09/2020  . Coronary atherosclerosis due to lipid rich plaque 09/02/2020  . COVID-19 virus infection 07/18/2020  . Coronary artery disease involving native heart without angina pectoris 07/10/2020  . Chronic kidney disease, stage 3a ([place]) 03/29/2020  . 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 ([place]) 03/16/2020  . Right epiretinal membrane 03/16/2020  . Posterior vitreous detachment of right eye 03/16/2020  . Aortic stenosis, moderate 03/12/2020   . 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 Near syncope 04/27/2018  . Hypoglycemia due to insulin 01/06/2018  . Essential hypertension 01/06/2018  . Diabetes mellitus type 2, with complication, on long term insulin pump ([place]) 01/06/2018  . Syncope and collapse 01/05/2018  . Encounter for therapeutic drug monitoring 06/29/2017  . OSA on CPAP 05/05/2017  . 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[place]) 01/05/2017  . Abnormal chest x-ray 10/12/2016  . Benign neoplasm of colon 01/14/2013   Past Medical History:  Diagnosis Date  . 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 Cataract   . Chronic kidney disease    d/t DM  . CKD (chronic kidney disease), stage III ([place])   . Diabetes mellitus    Vgo disposal insulin bolus  simular to insulin pump  . Dyspnea   . GERD (gastroesophageal reflux disease)   . History of kidney stones    passed  . Hyperlipidemia   . Hypertension   .  Idiopathic pulmonary fibrosis ([place]) 11/2016  . ILD (interstitial lung disease) ([place])   . Moderate aortic stenosis    a. 10/2019 Echo: EF 55-60%, Gr2 DD. Nl RV.   Marland Kitchen Neuromuscular disorder ([place])   . Neuropathy associated with endocrine disorder ([place])   . Nonobstructive CAD (coronary artery disease)    a. 2012 Cath: mod, nonobs dzs; b. 10/2016 MV: EF 60%, no ischemia.  . OSA on CPAP 05/05/2017   Unattended Home Sleep Test 7/2/813-AHI 38.6/hour, desaturation to 64%, body weight 261 pounds  . PONV (postoperative nausea and vomiting)   . Sleep apnea     uses cpap asked to bring mask and tubing    Family History  Problem Relation Age of Onset  . Diabetes Mellitus II Mother   . Emphysema Father 55  . Heart attack Father   . Colon cancer Neg Hx   . Esophageal cancer Neg Hx   . Rectal cancer Neg Hx   . Stomach cancer Neg Hx     Past Surgical History:  Procedure Laterality Date  . ABDOMINAL AORTOGRAM W/LOWER EXTREMITY N/A 12/10/2020   Procedure: ABDOMINAL AORTOGRAM W/LOWER EXTREMITY;  Surgeon: Cherre Robins, MD;   Location: [facility];  Service: Cardiovascular;  Laterality: N/A;  . AMPUTATION Right 01/22/2021   Procedure: 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;  Surgeon: [name], MD;  Location: [facility];  Service: Orthopedics;  Laterality: Right;  . ANKLE FUSION Right 01/22/2021   Procedure: RIGHT FOOT TIBIOCALCANEAL FUSION;  Surgeon: [name], MD;  Location: [facility];  Service: Orthopedics;  Laterality: Right;  . ANTERIOR FUSION CERVICAL SPINE  2012  . CARDIAC CATHETERIZATION  2011  . CARDIAC CATHETERIZATION N/A 11/09/2016   Procedure: Right Heart Cath;  Surgeon: Belva Crome, MD;  Location: [facility];  Service: Cardiovascular;  Laterality: N/A;  . carpel tunnel     left wrist  . CATARACT EXTRACTION    . CATARACT EXTRACTION W/ INTRAOCULAR LENS  IMPLANT, BILATERAL  2013  . CERVICAL LAMINECTOMY  2012  . COLONOSCOPY N/A 01/14/2013   Procedure: COLONOSCOPY;  Surgeon: Irene Shipper, MD;  Location: [facility];  Service: Endoscopy;  Laterality: N/A;  . CORONARY ARTERY BYPASS GRAFT  11/04/2020   LIMA-LAD, SVG-OM1, SVG-PDA (Dr Marney [facility]) dc 11/18/2020  . EYE SURGERY    . I & D EXTREMITY Right 02/19/2021   Procedure: RIGHT ANKLE DEBRIDEMENT AND PLACEMENT ANTIBIOTIC BEADS;  Surgeon: [name], MD;  Location: [facility];  Service: Orthopedics;  Laterality: Right;  . [place]   left  . LEFT HEART CATH AND CORONARY ANGIOGRAPHY N/A 07/10/2020   Procedure: LEFT HEART CATH AND CORONARY ANGIOGRAPHY;  Surgeon: Sherren Mocha, MD;  Location: [facility];  Service: Cardiovascular;  Laterality: N/A;  . LUMBAR LAMINECTOMY  2003  . LUNG BIOPSY Left 12/26/2016   Procedure: LUNG BIOPSY;  Surgeon: [name], MD;  Location: [facility];  Service: Thoracic;  Laterality: Left;  . PACEMAKER IMPLANT N/A 03/30/2020   Procedure: PACEMAKER IMPLANT;  Surgeon: Evans Lance, MD;  Location: [facility];  Service: Cardiovascular;  Laterality: N/A;  . PERIPHERAL VASCULAR INTERVENTION Right  12/10/2020   Procedure: PERIPHERAL VASCULAR INTERVENTION;  Surgeon: Cherre Robins, MD;  Location: [facility];  Service: Cardiovascular;  Laterality: Right;  SFA  . POSTERIOR FUSION CERVICAL SPINE  2012  . PPM GENERATOR REMOVAL N/A 12/14/2020   Procedure: PPM GENERATOR REMOVAL;  Surgeon: Cristopher Peru  W, MD;  Location: [facility];  Service: Cardiovascular;  Laterality: N/A;  . TEE WITHOUT CARDIOVERSION N/A 12/11/2020   Procedure: TRANSESOPHAGEAL ECHOCARDIOGRAM (TEE);  Surgeon: Geralynn Rile, MD;  Location: [facility];  Service: Cardiovascular;  Laterality: N/A;  . TRIGGER FINGER RELEASE  2011   4th finger left hand  . VIDEO ASSISTED THORACOSCOPY Left 12/26/2016   Procedure: VIDEO ASSISTED THORACOSCOPY;  Surgeon: [name], MD;  Location: [facility];  Service: Thoracic;  Laterality: Left;  Marland Kitchen VIDEO BRONCHOSCOPY N/A 12/26/2016   Procedure: VIDEO BRONCHOSCOPY;  Surgeon: [name], MD;  Location: [facility];  Service: Thoracic;  Laterality: N/A;   Social History   Occupational History  . Not on file  Tobacco Use  . Smoking status: Never Smoker  . Smokeless tobacco: Never Used  Vaping Use  . Vaping Use: Never used  Substance and Sexual Activity  . Alcohol use: No  . Drug use: No  . Sexual activity: Not Currently

## 2021-03-15 ENCOUNTER — Other Ambulatory Visit: Payer: Self-pay

## 2021-03-15 ENCOUNTER — Other Ambulatory Visit: Payer: Self-pay | Admitting: Physician Assistant

## 2021-03-16 ENCOUNTER — Other Ambulatory Visit (HOSPITAL_COMMUNITY)
Admission: RE | Admit: 2021-03-16 | Discharge: 2021-03-16 | Disposition: A | Discharge status: Home / Self Care | Payer: Medicare Other | Source: Ambulatory Visit | Attending: Orthopedic Surgery | Admitting: Orthopedic Surgery

## 2021-03-16 ENCOUNTER — Other Ambulatory Visit: Payer: Self-pay | Admitting: Physician Assistant

## 2021-03-16 ENCOUNTER — Encounter (HOSPITAL_COMMUNITY): Payer: Self-pay | Admitting: Orthopedic Surgery

## 2021-03-16 DIAGNOSIS — Z01812 Encounter for preprocedural laboratory examination: Secondary | ICD-10-CM | POA: Insufficient documentation

## 2021-03-16 DIAGNOSIS — Z20822 Contact with and (suspected) exposure to covid-19: Secondary | ICD-10-CM | POA: Insufficient documentation

## 2021-03-16 LAB — SARS CORONAVIRUS 2 (TAT 6-24 HRS): SARS Coronavirus 2: NEGATIVE

## 2021-03-16 MED ORDER — DEXTROSE 5 % IV SOLN
3.0000 g | INTRAVENOUS | Status: DC
  Filled 2021-03-16: qty 3000

## 2021-03-16 NOTE — Anesthesia Preprocedure Evaluation (Addendum)
Anesthesia Evaluation  Patient identified by MRN, date of birth, ID band Patient awake    Reviewed: Allergy & Precautions, NPO status , Patient's Chart, lab work & pertinent test results  History of Anesthesia Complications (+) PONVNegative for: history of anesthetic complications  Airway Mallampati: III  TM Distance: >3 FB Neck ROM: Full    Dental  (+) Teeth Intact   Pulmonary shortness of breath (idiopathic pulmonary fibrosis), sleep apnea and Continuous Positive Airway Pressure Ventilation ,    Pulmonary exam normal        Cardiovascular hypertension, Pt. on medications and Pt. on home beta blockers + CAD, + CABG, + Peripheral Vascular Disease and +CHF  Normal cardiovascular exam+ dysrhythmias Atrial Fibrillation + Valvular Problems/Murmurs AS      Neuro/Psych Anxiety Depression negative psych ROS   GI/Hepatic Neg liver ROS, GERD  ,  Endo/Other  diabetes, Type 2, Insulin Dependent  Renal/GU Renal InsufficiencyRenal disease (CKD 3a)  negative genitourinary   Musculoskeletal negative musculoskeletal ROS (+)   Abdominal   Peds  Hematology  (+) anemia , Eliquis   Anesthesia Other Findings  OSA on CPAP, idiopathic pulmonary fibrosis, CAD s/p CABG (Dec 2021), A fib, PVD, h/o endocarditis, IDDM2, CKD3a  TEE 12/11/20: EF 50-55%, moderately reduced RV function, mild aortic stenosis (MG 12.5, AVA 1.65), mild MR, mod-sev TR, small fibrous stranding attached to the leaflet tips of the aortic valve that could be related to valve degeneration. 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 In the setting of bacteremia, cannot exclude endocarditis. The stranding is small (0.5 cm x 0.2 cm) and there is no significant regurgitaiton of the aortic valve  Reproductive/Obstetrics                            Anesthesia Physical  Anesthesia Plan  ASA: IV  Anesthesia Plan: MAC and Regional   Post-op Pain  Management:  Regional for Post-op pain   Induction: Intravenous  PONV Risk Score and Plan: 2 and Propofol infusion, TIVA and Treatment may vary due to age or medical condition  Airway Management Planned: Natural Airway, Nasal Cannula and Simple Face Mask  Additional Equipment: None  Intra-op Plan:   Post-operative Plan:   Informed Consent: I have reviewed the patients History and Physical, chart, labs and discussed the procedure including the risks, benefits and alternatives for the proposed anesthesia with the patient or authorized representative who has indicated his/her understanding and acceptance.     Dental advisory given  Plan Discussed with:   Anesthesia Plan Comments:        Anesthesia Quick Evaluation

## 2021-03-16 NOTE — Progress Notes (Signed)
Spoke with pt's wife [name] for pre-op call. DPR on file. Pt has hx of CABG in December 2021 and pacemaker has been removed due to infection. Pt is a type 2 diabetic. Last A1C was 7.8 on 02/15/21. She states pt wears a [employer] and his fasting blood sugar is usually between 110-150.   Covid test done today, result pending.  [name] states pt has been in quarantine since the test was done and understands that he stays in quarantine until he comes to the hospital tomorrow.

## 2021-03-17 ENCOUNTER — Encounter (HOSPITAL_COMMUNITY)
Admission: RE | Disposition: A | Discharge status: Still In Hospital | Payer: Self-pay | Source: Home / Self Care | Attending: Internal Medicine

## 2021-03-17 ENCOUNTER — Other Ambulatory Visit: Payer: Self-pay

## 2021-03-17 ENCOUNTER — Inpatient Hospital Stay (HOSPITAL_COMMUNITY): Payer: Medicare Other | Admitting: Anesthesiology

## 2021-03-17 ENCOUNTER — Encounter (HOSPITAL_COMMUNITY): Payer: Self-pay | Admitting: Orthopedic Surgery

## 2021-03-17 ENCOUNTER — Inpatient Hospital Stay (HOSPITAL_COMMUNITY)
Admission: RE | Admit: 2021-03-17 | Discharge: 2021-03-26 | DRG: 239 | Disposition: A | Discharge status: Rehabilitation Facility | Payer: Medicare Other | Attending: Internal Medicine | Admitting: Internal Medicine

## 2021-03-17 DIAGNOSIS — T45515A Adverse effect of anticoagulants, initial encounter: Secondary | ICD-10-CM | POA: Diagnosis not present

## 2021-03-17 DIAGNOSIS — Z8249 Family history of ischemic heart disease and other diseases of the circulatory system: Secondary | ICD-10-CM

## 2021-03-17 DIAGNOSIS — I482 Chronic atrial fibrillation, unspecified: Secondary | ICD-10-CM | POA: Diagnosis present

## 2021-03-17 DIAGNOSIS — E1140 Type 2 diabetes mellitus with diabetic neuropathy, unspecified: Secondary | ICD-10-CM | POA: Diagnosis present

## 2021-03-17 DIAGNOSIS — Z4781 Encounter for orthopedic aftercare following surgical amputation: Secondary | ICD-10-CM | POA: Diagnosis not present

## 2021-03-17 DIAGNOSIS — J84112 Idiopathic pulmonary fibrosis: Secondary | ICD-10-CM | POA: Diagnosis present

## 2021-03-17 DIAGNOSIS — F419 Anxiety disorder, unspecified: Secondary | ICD-10-CM | POA: Diagnosis present

## 2021-03-17 DIAGNOSIS — E1165 Type 2 diabetes mellitus with hyperglycemia: Secondary | ICD-10-CM

## 2021-03-17 DIAGNOSIS — I480 Paroxysmal atrial fibrillation: Secondary | ICD-10-CM

## 2021-03-17 DIAGNOSIS — Z7984 Long term (current) use of oral hypoglycemic drugs: Secondary | ICD-10-CM

## 2021-03-17 DIAGNOSIS — L97525 Non-pressure chronic ulcer of other part of left foot with muscle involvement without evidence of necrosis: Secondary | ICD-10-CM | POA: Diagnosis not present

## 2021-03-17 DIAGNOSIS — Z792 Long term (current) use of antibiotics: Secondary | ICD-10-CM

## 2021-03-17 DIAGNOSIS — L02415 Cutaneous abscess of right lower limb: Secondary | ICD-10-CM | POA: Diagnosis present

## 2021-03-17 DIAGNOSIS — I129 Hypertensive chronic kidney disease with stage 1 through stage 4 chronic kidney disease, or unspecified chronic kidney disease: Secondary | ICD-10-CM | POA: Diagnosis present

## 2021-03-17 DIAGNOSIS — I441 Atrioventricular block, second degree: Secondary | ICD-10-CM | POA: Diagnosis present

## 2021-03-17 DIAGNOSIS — Z885 Allergy status to narcotic agent status: Secondary | ICD-10-CM

## 2021-03-17 DIAGNOSIS — R7309 Other abnormal glucose: Secondary | ICD-10-CM | POA: Diagnosis not present

## 2021-03-17 DIAGNOSIS — Z888 Allergy status to other drugs, medicaments and biological substances status: Secondary | ICD-10-CM

## 2021-03-17 DIAGNOSIS — E1152 Type 2 diabetes mellitus with diabetic peripheral angiopathy with gangrene: Principal | ICD-10-CM | POA: Diagnosis present

## 2021-03-17 DIAGNOSIS — N183 Chronic kidney disease, stage 3 unspecified: Secondary | ICD-10-CM | POA: Diagnosis not present

## 2021-03-17 DIAGNOSIS — I2510 Atherosclerotic heart disease of native coronary artery without angina pectoris: Secondary | ICD-10-CM | POA: Diagnosis present

## 2021-03-17 DIAGNOSIS — Z20822 Contact with and (suspected) exposure to covid-19: Secondary | ICD-10-CM | POA: Diagnosis present

## 2021-03-17 DIAGNOSIS — Y835 Amputation of limb(s) as the cause of abnormal reaction of the patient, or of later complication, without mention of misadventure at the time of the procedure: Secondary | ICD-10-CM | POA: Diagnosis not present

## 2021-03-17 DIAGNOSIS — Z794 Long term (current) use of insulin: Secondary | ICD-10-CM

## 2021-03-17 DIAGNOSIS — R578 Other shock: Secondary | ICD-10-CM | POA: Diagnosis not present

## 2021-03-17 DIAGNOSIS — Z833 Family history of diabetes mellitus: Secondary | ICD-10-CM

## 2021-03-17 DIAGNOSIS — I70261 Atherosclerosis of native arteries of extremities with gangrene, right leg: Secondary | ICD-10-CM | POA: Diagnosis present

## 2021-03-17 DIAGNOSIS — I10 Essential (primary) hypertension: Secondary | ICD-10-CM | POA: Diagnosis not present

## 2021-03-17 DIAGNOSIS — I4892 Unspecified atrial flutter: Secondary | ICD-10-CM | POA: Diagnosis present

## 2021-03-17 DIAGNOSIS — T8119XD Other postprocedural shock, subsequent encounter: Secondary | ICD-10-CM | POA: Diagnosis not present

## 2021-03-17 DIAGNOSIS — D6832 Hemorrhagic disorder due to extrinsic circulating anticoagulants: Secondary | ICD-10-CM | POA: Diagnosis not present

## 2021-03-17 DIAGNOSIS — M71071 Abscess of bursa, right ankle and foot: Secondary | ICD-10-CM

## 2021-03-17 DIAGNOSIS — G4733 Obstructive sleep apnea (adult) (pediatric): Secondary | ICD-10-CM | POA: Diagnosis present

## 2021-03-17 DIAGNOSIS — E1169 Type 2 diabetes mellitus with other specified complication: Secondary | ICD-10-CM | POA: Diagnosis not present

## 2021-03-17 DIAGNOSIS — Z951 Presence of aortocoronary bypass graft: Secondary | ICD-10-CM

## 2021-03-17 DIAGNOSIS — R0602 Shortness of breath: Secondary | ICD-10-CM | POA: Diagnosis not present

## 2021-03-17 DIAGNOSIS — Z79899 Other long term (current) drug therapy: Secondary | ICD-10-CM | POA: Diagnosis not present

## 2021-03-17 DIAGNOSIS — Z89511 Acquired absence of right leg below knee: Secondary | ICD-10-CM

## 2021-03-17 DIAGNOSIS — I4891 Unspecified atrial fibrillation: Secondary | ICD-10-CM

## 2021-03-17 DIAGNOSIS — E118 Type 2 diabetes mellitus with unspecified complications: Secondary | ICD-10-CM | POA: Diagnosis not present

## 2021-03-17 DIAGNOSIS — N1832 Chronic kidney disease, stage 3b: Secondary | ICD-10-CM | POA: Diagnosis present

## 2021-03-17 DIAGNOSIS — Z9641 Presence of insulin pump (external) (internal): Secondary | ICD-10-CM | POA: Diagnosis not present

## 2021-03-17 DIAGNOSIS — E875 Hyperkalemia: Secondary | ICD-10-CM | POA: Diagnosis not present

## 2021-03-17 DIAGNOSIS — K219 Gastro-esophageal reflux disease without esophagitis: Secondary | ICD-10-CM | POA: Diagnosis present

## 2021-03-17 DIAGNOSIS — T8119XA Other postprocedural shock, initial encounter: Secondary | ICD-10-CM | POA: Diagnosis not present

## 2021-03-17 DIAGNOSIS — E11621 Type 2 diabetes mellitus with foot ulcer: Secondary | ICD-10-CM | POA: Diagnosis not present

## 2021-03-17 DIAGNOSIS — D62 Acute posthemorrhagic anemia: Secondary | ICD-10-CM | POA: Diagnosis not present

## 2021-03-17 DIAGNOSIS — G8918 Other acute postprocedural pain: Secondary | ICD-10-CM

## 2021-03-17 DIAGNOSIS — Z7901 Long term (current) use of anticoagulants: Secondary | ICD-10-CM

## 2021-03-17 DIAGNOSIS — E785 Hyperlipidemia, unspecified: Secondary | ICD-10-CM | POA: Diagnosis present

## 2021-03-17 DIAGNOSIS — Z825 Family history of asthma and other chronic lower respiratory diseases: Secondary | ICD-10-CM

## 2021-03-17 DIAGNOSIS — Z7982 Long term (current) use of aspirin: Secondary | ICD-10-CM

## 2021-03-17 DIAGNOSIS — Z981 Arthrodesis status: Secondary | ICD-10-CM

## 2021-03-17 DIAGNOSIS — J8410 Pulmonary fibrosis, unspecified: Secondary | ICD-10-CM | POA: Diagnosis not present

## 2021-03-17 DIAGNOSIS — E871 Hypo-osmolality and hyponatremia: Secondary | ICD-10-CM | POA: Diagnosis not present

## 2021-03-17 DIAGNOSIS — I714 Abdominal aortic aneurysm, without rupture: Secondary | ICD-10-CM | POA: Diagnosis present

## 2021-03-17 DIAGNOSIS — E669 Obesity, unspecified: Secondary | ICD-10-CM | POA: Diagnosis not present

## 2021-03-17 DIAGNOSIS — D500 Iron deficiency anemia secondary to blood loss (chronic): Secondary | ICD-10-CM | POA: Diagnosis not present

## 2021-03-17 DIAGNOSIS — E1122 Type 2 diabetes mellitus with diabetic chronic kidney disease: Secondary | ICD-10-CM | POA: Diagnosis present

## 2021-03-17 DIAGNOSIS — N182 Chronic kidney disease, stage 2 (mild): Secondary | ICD-10-CM | POA: Diagnosis not present

## 2021-03-17 DIAGNOSIS — I350 Nonrheumatic aortic (valve) stenosis: Secondary | ICD-10-CM | POA: Diagnosis present

## 2021-03-17 HISTORY — DX: Anxiety disorder, unspecified: F41.9

## 2021-03-17 HISTORY — DX: Depression, unspecified: F32.A

## 2021-03-17 HISTORY — PX: AMPUTATION: SHX166

## 2021-03-17 LAB — CBC
HCT: 30.4 % — ABNORMAL LOW (ref 39.0–52.0)
Hemoglobin: 9.4 g/dL — ABNORMAL LOW (ref 13.0–17.0)
MCH: 27.1 pg (ref 26.0–34.0)
MCHC: 30.9 g/dL (ref 30.0–36.0)
MCV: 87.6 fL (ref 80.0–100.0)
Platelets: 353 10*3/uL (ref 150–400)
RBC: 3.47 MIL/uL — ABNORMAL LOW (ref 4.22–5.81)
RDW: 15 % (ref 11.5–15.5)
WBC: 8.1 10*3/uL (ref 4.0–10.5)
nRBC: 0 % (ref 0.0–0.2)

## 2021-03-17 LAB — BASIC METABOLIC PANEL
Anion gap: 13 (ref 5–15)
BUN: 53 mg/dL — ABNORMAL HIGH (ref 8–23)
CO2: 27 mmol/L (ref 22–32)
Calcium: 9 mg/dL (ref 8.9–10.3)
Chloride: 90 mmol/L — ABNORMAL LOW (ref 98–111)
Creatinine, Ser: 1.81 mg/dL — ABNORMAL HIGH (ref 0.61–1.24)
GFR, Estimated: 38 mL/min — ABNORMAL LOW (ref >60)
Glucose, Bld: 139 mg/dL — ABNORMAL HIGH (ref 70–99)
Potassium: 4.4 mmol/L (ref 3.5–5.1)
Sodium: 130 mmol/L — ABNORMAL LOW (ref 135–145)

## 2021-03-17 LAB — GLUCOSE, CAPILLARY
Glucose-Capillary: 124 mg/dL — ABNORMAL HIGH (ref 70–99)
Glucose-Capillary: 141 mg/dL — ABNORMAL HIGH (ref 70–99)
Glucose-Capillary: 151 mg/dL — ABNORMAL HIGH (ref 70–99)
Glucose-Capillary: 180 mg/dL — ABNORMAL HIGH (ref 70–99)

## 2021-03-17 SURGERY — AMPUTATION BELOW KNEE
Anesthesia: Monitor Anesthesia Care | Site: Knee | Laterality: Right

## 2021-03-17 MED ORDER — ASCORBIC ACID 500 MG PO TABS
1000.0000 mg | ORAL_TABLET | Freq: Every day | ORAL | Status: DC
  Administered 2021-03-17 – 2021-03-26 (×10): 1000 mg via ORAL
  Filled 2021-03-17 (×11): qty 2

## 2021-03-17 MED ORDER — ZINC SULFATE 220 (50 ZN) MG PO CAPS
220.0000 mg | ORAL_CAPSULE | Freq: Every day | ORAL | Status: DC
  Administered 2021-03-18 – 2021-03-26 (×9): 220 mg via ORAL
  Filled 2021-03-17 (×9): qty 1

## 2021-03-17 MED ORDER — INSULIN LISPRO 100 UNIT/ML ~~LOC~~ SOLN: 7.0000 [IU] | Freq: Three times a day (TID) | SUBCUTANEOUS | Status: DC

## 2021-03-17 MED ORDER — HYDRALAZINE HCL 20 MG/ML IJ SOLN: 5.0000 mg | INTRAMUSCULAR | Status: DC | PRN

## 2021-03-17 MED ORDER — PANTOPRAZOLE SODIUM 40 MG PO TBEC
40.0000 mg | DELAYED_RELEASE_TABLET | Freq: Every day | ORAL | Status: DC
  Administered 2021-03-17 – 2021-03-25 (×9): 40 mg via ORAL
  Filled 2021-03-17 (×9): qty 1

## 2021-03-17 MED ORDER — ALBUTEROL SULFATE HFA 108 (90 BASE) MCG/ACT IN AERS
2.0000 | INHALATION_SPRAY | RESPIRATORY_TRACT | Status: DC | PRN
  Filled 2021-03-17: qty 6.7

## 2021-03-17 MED ORDER — FENTANYL CITRATE (PF) 100 MCG/2ML IJ SOLN
INTRAMUSCULAR | Status: AC
  Filled 2021-03-17: qty 2

## 2021-03-17 MED ORDER — OXYCODONE HCL 5 MG PO TABS
5.0000 mg | ORAL_TABLET | ORAL | Status: DC | PRN
  Administered 2021-03-17 – 2021-03-18 (×2): 10 mg via ORAL
  Administered 2021-03-20 (×2): 5 mg via ORAL
  Administered 2021-03-24: 10 mg via ORAL
  Administered 2021-03-25: 5 mg via ORAL
  Administered 2021-03-26: 10 mg via ORAL
  Filled 2021-03-17 (×6): qty 2

## 2021-03-17 MED ORDER — MAGNESIUM OXIDE 400 MG PO TABS
400.0000 mg | ORAL_TABLET | Freq: Every day | ORAL | Status: DC
  Filled 2021-03-17: qty 1

## 2021-03-17 MED ORDER — POLYETHYLENE GLYCOL 3350 17 G PO PACK: 17.0000 g | PACK | Freq: Every day | ORAL | Status: DC | PRN

## 2021-03-17 MED ORDER — EPHEDRINE SULFATE-NACL 50-0.9 MG/10ML-% IV SOSY
PREFILLED_SYRINGE | INTRAVENOUS | Status: DC | PRN
  Administered 2021-03-17 (×4): 10 mg via INTRAVENOUS

## 2021-03-17 MED ORDER — ASPIRIN EC 81 MG PO TBEC
81.0000 mg | DELAYED_RELEASE_TABLET | Freq: Every day | ORAL | Status: DC
  Administered 2021-03-18: 81 mg via ORAL
  Filled 2021-03-17: qty 1

## 2021-03-17 MED ORDER — ADULT MULTIVITAMIN W/MINERALS CH
1.0000 | ORAL_TABLET | Freq: Every day | ORAL | Status: DC
  Administered 2021-03-17 – 2021-03-26 (×10): 1 via ORAL
  Filled 2021-03-17 (×10): qty 1

## 2021-03-17 MED ORDER — PHENYLEPHRINE 40 MCG/ML (10ML) SYRINGE FOR IV PUSH (FOR BLOOD PRESSURE SUPPORT)
PREFILLED_SYRINGE | INTRAVENOUS | Status: DC | PRN
  Administered 2021-03-17: 40 ug via INTRAVENOUS
  Administered 2021-03-17: 80 ug via INTRAVENOUS
  Administered 2021-03-17: 40 ug via INTRAVENOUS

## 2021-03-17 MED ORDER — PROSOURCE PLUS PO LIQD
30.0000 mL | Freq: Two times a day (BID) | ORAL | Status: DC
  Administered 2021-03-18 – 2021-03-19 (×2): 30 mL via ORAL
  Filled 2021-03-17 (×7): qty 30

## 2021-03-17 MED ORDER — BUPIVACAINE HCL (PF) 0.5 % IJ SOLN
INTRAMUSCULAR | Status: DC | PRN
  Administered 2021-03-17 (×2): 15 mL via PERINEURAL

## 2021-03-17 MED ORDER — PROPOFOL 10 MG/ML IV BOLUS
INTRAVENOUS | Status: AC
  Filled 2021-03-17: qty 40

## 2021-03-17 MED ORDER — AMISULPRIDE (ANTIEMETIC) 5 MG/2ML IV SOLN: 10.0000 mg | Freq: Once | INTRAVENOUS | Status: DC | PRN

## 2021-03-17 MED ORDER — HYDROMORPHONE HCL 1 MG/ML IJ SOLN
0.5000 mg | INTRAMUSCULAR | Status: DC | PRN
  Administered 2021-03-23 – 2021-03-24 (×3): 0.5 mg via INTRAVENOUS
  Filled 2021-03-17 (×3): qty 0.5

## 2021-03-17 MED ORDER — PROPOFOL 500 MG/50ML IV EMUL
INTRAVENOUS | Status: DC | PRN
  Administered 2021-03-17: 20 mg via INTRAVENOUS
  Administered 2021-03-17: 100 ug/kg/min via INTRAVENOUS

## 2021-03-17 MED ORDER — ACETAMINOPHEN 325 MG PO TABS
325.0000 mg | ORAL_TABLET | Freq: Four times a day (QID) | ORAL | Status: DC | PRN
  Administered 2021-03-24: 325 mg via ORAL
  Administered 2021-03-25: 650 mg via ORAL
  Filled 2021-03-17: qty 1
  Filled 2021-03-17: qty 2

## 2021-03-17 MED ORDER — MAGNESIUM OXIDE 400 (241.3 MG) MG PO TABS
400.0000 mg | ORAL_TABLET | Freq: Every day | ORAL | Status: DC
  Administered 2021-03-18 – 2021-03-26 (×9): 400 mg via ORAL
  Filled 2021-03-17 (×9): qty 1

## 2021-03-17 MED ORDER — LIDOCAINE 2% (20 MG/ML) 5 ML SYRINGE
INTRAMUSCULAR | Status: DC | PRN
  Administered 2021-03-17: 60 mg via INTRAVENOUS

## 2021-03-17 MED ORDER — METOPROLOL TARTRATE 25 MG PO TABS
25.0000 mg | ORAL_TABLET | Freq: Every day | ORAL | Status: DC
  Administered 2021-03-18: 25 mg via ORAL
  Filled 2021-03-17: qty 1

## 2021-03-17 MED ORDER — FENTANYL CITRATE (PF) 250 MCG/5ML IJ SOLN
INTRAMUSCULAR | Status: DC | PRN
  Administered 2021-03-17: 50 ug via INTRAVENOUS

## 2021-03-17 MED ORDER — APIXABAN 5 MG PO TABS
5.0000 mg | ORAL_TABLET | Freq: Two times a day (BID) | ORAL | Status: DC
  Administered 2021-03-18: 5 mg via ORAL
  Filled 2021-03-17: qty 1

## 2021-03-17 MED ORDER — PHENOL 1.4 % MT LIQD: 1.0000 | OROMUCOSAL | Status: DC | PRN

## 2021-03-17 MED ORDER — MAGNESIUM CITRATE PO SOLN: 1.0000 | Freq: Once | ORAL | Status: DC | PRN

## 2021-03-17 MED ORDER — METOPROLOL TARTRATE 5 MG/5ML IV SOLN: 2.0000 mg | INTRAVENOUS | Status: DC | PRN

## 2021-03-17 MED ORDER — DOCUSATE SODIUM 100 MG PO CAPS
100.0000 mg | ORAL_CAPSULE | Freq: Every day | ORAL | Status: DC
  Administered 2021-03-18 – 2021-03-26 (×6): 100 mg via ORAL
  Filled 2021-03-17 (×8): qty 1

## 2021-03-17 MED ORDER — LABETALOL HCL 5 MG/ML IV SOLN: 10.0000 mg | INTRAVENOUS | Status: DC | PRN

## 2021-03-17 MED ORDER — CHLORHEXIDINE GLUCONATE 0.12 % MT SOLN
15.0000 mL | Freq: Once | OROMUCOSAL | Status: AC
  Administered 2021-03-17: 15 mL via OROMUCOSAL
  Filled 2021-03-17: qty 15

## 2021-03-17 MED ORDER — OXYCODONE HCL 5 MG PO TABS
ORAL_TABLET | ORAL | Status: AC
  Filled 2021-03-17: qty 1

## 2021-03-17 MED ORDER — ONDANSETRON HCL 4 MG/2ML IJ SOLN: 4.0000 mg | Freq: Once | INTRAMUSCULAR | Status: DC | PRN

## 2021-03-17 MED ORDER — ONDANSETRON HCL 4 MG/2ML IJ SOLN
4.0000 mg | Freq: Four times a day (QID) | INTRAMUSCULAR | Status: DC | PRN
  Administered 2021-03-17 – 2021-03-23 (×4): 4 mg via INTRAVENOUS
  Filled 2021-03-17 (×6): qty 2

## 2021-03-17 MED ORDER — ORAL CARE MOUTH RINSE: 15.0000 mL | Freq: Once | OROMUCOSAL | Status: AC

## 2021-03-17 MED ORDER — GUAIFENESIN-DM 100-10 MG/5ML PO SYRP: 15.0000 mL | ORAL_SOLUTION | ORAL | Status: DC | PRN

## 2021-03-17 MED ORDER — 0.9 % SODIUM CHLORIDE (POUR BTL) OPTIME
TOPICAL | Status: DC | PRN
  Administered 2021-03-17: 1000 mL

## 2021-03-17 MED ORDER — ESCITALOPRAM OXALATE 10 MG PO TABS
10.0000 mg | ORAL_TABLET | Freq: Every day | ORAL | Status: DC
  Administered 2021-03-18 – 2021-03-26 (×9): 10 mg via ORAL
  Filled 2021-03-17 (×9): qty 1

## 2021-03-17 MED ORDER — POLYSACCHARIDE IRON COMPLEX 150 MG PO CAPS
150.0000 mg | ORAL_CAPSULE | Freq: Every day | ORAL | Status: DC
  Administered 2021-03-19 – 2021-03-26 (×8): 150 mg via ORAL
  Filled 2021-03-17 (×9): qty 1

## 2021-03-17 MED ORDER — INSULIN ASPART 100 UNIT/ML ~~LOC~~ SOLN
0.0000 [IU] | Freq: Three times a day (TID) | SUBCUTANEOUS | Status: DC
  Administered 2021-03-17: 3 [IU] via SUBCUTANEOUS
  Administered 2021-03-18 (×2): 5 [IU] via SUBCUTANEOUS
  Administered 2021-03-18: 8 [IU] via SUBCUTANEOUS
  Administered 2021-03-19: 5 [IU] via SUBCUTANEOUS
  Administered 2021-03-19 (×2): 3 [IU] via SUBCUTANEOUS
  Administered 2021-03-20: 2 [IU] via SUBCUTANEOUS
  Administered 2021-03-20 (×2): 3 [IU] via SUBCUTANEOUS
  Administered 2021-03-21 (×2): 2 [IU] via SUBCUTANEOUS
  Administered 2021-03-21: 3 [IU] via SUBCUTANEOUS
  Administered 2021-03-22: 2 [IU] via SUBCUTANEOUS
  Administered 2021-03-22 (×2): 3 [IU] via SUBCUTANEOUS
  Administered 2021-03-23: 2 [IU] via SUBCUTANEOUS
  Administered 2021-03-23 – 2021-03-24 (×5): 3 [IU] via SUBCUTANEOUS
  Administered 2021-03-25: 2 [IU] via SUBCUTANEOUS
  Administered 2021-03-26: 3 [IU] via SUBCUTANEOUS

## 2021-03-17 MED ORDER — LACTATED RINGERS IV SOLN: INTRAVENOUS | Status: DC

## 2021-03-17 MED ORDER — PHENYLEPHRINE 40 MCG/ML (10ML) SYRINGE FOR IV PUSH (FOR BLOOD PRESSURE SUPPORT)
PREFILLED_SYRINGE | INTRAVENOUS | Status: DC | PRN
  Administered 2021-03-17: 80 ug via INTRAVENOUS

## 2021-03-17 MED ORDER — FENTANYL CITRATE (PF) 250 MCG/5ML IJ SOLN
INTRAMUSCULAR | Status: AC
  Filled 2021-03-17: qty 5

## 2021-03-17 MED ORDER — CEFAZOLIN SODIUM-DEXTROSE 2-4 GM/100ML-% IV SOLN
2.0000 g | INTRAVENOUS | Status: AC
  Administered 2021-03-17: 2 g via INTRAVENOUS

## 2021-03-17 MED ORDER — CEFAZOLIN SODIUM-DEXTROSE 1-4 GM/50ML-% IV SOLN
1.0000 g | Freq: Three times a day (TID) | INTRAVENOUS | Status: AC
  Administered 2021-03-17 – 2021-03-18 (×2): 1 g via INTRAVENOUS
  Filled 2021-03-17 (×3): qty 50

## 2021-03-17 MED ORDER — FUROSEMIDE 40 MG PO TABS
40.0000 mg | ORAL_TABLET | Freq: Every day | ORAL | Status: DC
  Administered 2021-03-18: 40 mg via ORAL
  Filled 2021-03-17: qty 1

## 2021-03-17 MED ORDER — LORAZEPAM 1 MG PO TABS
1.0000 mg | ORAL_TABLET | Freq: Every day | ORAL | Status: DC
  Administered 2021-03-17 – 2021-03-25 (×9): 1 mg via ORAL
  Filled 2021-03-17 (×9): qty 1

## 2021-03-17 MED ORDER — ALBUMIN HUMAN 5 % IV SOLN: INTRAVENOUS | Status: DC | PRN

## 2021-03-17 MED ORDER — ALUM & MAG HYDROXIDE-SIMETH 200-200-20 MG/5ML PO SUSP: 15.0000 mL | ORAL | Status: DC | PRN

## 2021-03-17 MED ORDER — LOSARTAN POTASSIUM 50 MG PO TABS
25.0000 mg | ORAL_TABLET | Freq: Every day | ORAL | Status: DC
  Administered 2021-03-18: 25 mg via ORAL
  Filled 2021-03-17: qty 1

## 2021-03-17 MED ORDER — ONDANSETRON HCL 4 MG/2ML IJ SOLN
INTRAMUSCULAR | Status: DC | PRN
  Administered 2021-03-17: 4 mg via INTRAVENOUS

## 2021-03-17 MED ORDER — MIDAZOLAM HCL 2 MG/2ML IJ SOLN
INTRAMUSCULAR | Status: AC
  Filled 2021-03-17: qty 2

## 2021-03-17 MED ORDER — BUPIVACAINE LIPOSOME 1.3 % IJ SUSP
INTRAMUSCULAR | Status: DC | PRN
  Administered 2021-03-17: 10 mL

## 2021-03-17 MED ORDER — POTASSIUM CHLORIDE CRYS ER 20 MEQ PO TBCR: 20.0000 meq | EXTENDED_RELEASE_TABLET | Freq: Every day | ORAL | Status: DC | PRN

## 2021-03-17 MED ORDER — TRANEXAMIC ACID-NACL 1000-0.7 MG/100ML-% IV SOLN
1000.0000 mg | Freq: Once | INTRAVENOUS | Status: AC
  Administered 2021-03-17: 1000 mg via INTRAVENOUS
  Filled 2021-03-17: qty 100

## 2021-03-17 MED ORDER — TRANEXAMIC ACID-NACL 1000-0.7 MG/100ML-% IV SOLN
INTRAVENOUS | Status: AC
  Filled 2021-03-17: qty 100

## 2021-03-17 MED ORDER — OXYCODONE HCL 5 MG/5ML PO SOLN: 5.0000 mg | Freq: Once | ORAL | Status: AC | PRN

## 2021-03-17 MED ORDER — DAPAGLIFLOZIN PROPANEDIOL 10 MG PO TABS
10.0000 mg | ORAL_TABLET | Freq: Every day | ORAL | Status: DC
  Administered 2021-03-18: 10 mg via ORAL
  Filled 2021-03-17: qty 1

## 2021-03-17 MED ORDER — PROPOFOL 500 MG/50ML IV EMUL: INTRAVENOUS | Status: DC | PRN

## 2021-03-17 MED ORDER — SODIUM CHLORIDE 0.9 % IV SOLN: INTRAVENOUS | Status: DC

## 2021-03-17 MED ORDER — BISACODYL 5 MG PO TBEC: 5.0000 mg | DELAYED_RELEASE_TABLET | Freq: Every day | ORAL | Status: DC | PRN

## 2021-03-17 MED ORDER — TRANEXAMIC ACID-NACL 1000-0.7 MG/100ML-% IV SOLN
INTRAVENOUS | Status: DC | PRN
  Administered 2021-03-17: 1000 mg via INTRAVENOUS

## 2021-03-17 MED ORDER — OXYCODONE HCL 5 MG PO TABS
5.0000 mg | ORAL_TABLET | Freq: Once | ORAL | Status: AC | PRN
  Administered 2021-03-17: 5 mg via ORAL

## 2021-03-17 MED ORDER — ATORVASTATIN CALCIUM 10 MG PO TABS
20.0000 mg | ORAL_TABLET | Freq: Two times a day (BID) | ORAL | Status: DC
  Administered 2021-03-17 – 2021-03-26 (×18): 20 mg via ORAL
  Filled 2021-03-17 (×18): qty 2

## 2021-03-17 MED ORDER — FREESTYLE LIBRE 14 DAY SENSOR MISC: 1.0000 | Status: DC

## 2021-03-17 MED ORDER — MIDAZOLAM HCL 2 MG/2ML IJ SOLN
INTRAMUSCULAR | Status: DC | PRN
  Administered 2021-03-17: 1 mg via INTRAVENOUS

## 2021-03-17 MED ORDER — FENTANYL CITRATE (PF) 100 MCG/2ML IJ SOLN
25.0000 ug | INTRAMUSCULAR | Status: DC | PRN
  Administered 2021-03-17 (×4): 25 ug via INTRAVENOUS
  Administered 2021-03-17: 50 ug via INTRAVENOUS

## 2021-03-17 SURGICAL SUPPLY — 38 items
BLADE SAW RECIP 87.9 MT (BLADE) ×2 IMPLANT
BLADE SURG 21 STRL SS (BLADE) ×2 IMPLANT
BNDG COHESIVE 4X5 TAN STRL (GAUZE/BANDAGES/DRESSINGS) ×2 IMPLANT
BNDG COHESIVE 6X5 TAN STRL LF (GAUZE/BANDAGES/DRESSINGS) ×1 IMPLANT
CANISTER WOUND CARE 500ML ATS (WOUND CARE) ×2 IMPLANT
COVER SURGICAL LIGHT HANDLE (MISCELLANEOUS) ×2 IMPLANT
CUFF TOURN SGL QUICK 34 (TOURNIQUET CUFF) ×2
CUFF TRNQT CYL 34X4.125X (TOURNIQUET CUFF) ×1 IMPLANT
DRAPE INCISE IOBAN 66X45 STRL (DRAPES) ×2 IMPLANT
DRAPE U-SHAPE 47X51 STRL (DRAPES) ×2 IMPLANT
DRESSING PREVENA PLUS CUSTOM (GAUZE/BANDAGES/DRESSINGS) ×1 IMPLANT
DRSG PREVENA PLUS CUSTOM (GAUZE/BANDAGES/DRESSINGS) ×2
DURAPREP 26ML APPLICATOR (WOUND CARE) ×2 IMPLANT
ELECT REM PT RETURN 9FT ADLT (ELECTROSURGICAL) ×2
ELECTRODE REM PT RTRN 9FT ADLT (ELECTROSURGICAL) ×1 IMPLANT
GLOVE BIOGEL PI IND STRL 9 (GLOVE) ×1 IMPLANT
GLOVE BIOGEL PI INDICATOR 9 (GLOVE) ×1
GLOVE SURG ORTHO 9.0 STRL STRW (GLOVE) ×2 IMPLANT
GOWN STRL REUS W/ TWL XL LVL3 (GOWN DISPOSABLE) ×2 IMPLANT
GOWN STRL REUS W/TWL XL LVL3 (GOWN DISPOSABLE) ×4
KIT BASIN OR (CUSTOM PROCEDURE TRAY) ×2 IMPLANT
KIT TURNOVER KIT B (KITS) ×2 IMPLANT
MANIFOLD NEPTUNE II (INSTRUMENTS) ×2 IMPLANT
NS IRRIG 1000ML POUR BTL (IV SOLUTION) ×2 IMPLANT
PACK ORTHO EXTREMITY (CUSTOM PROCEDURE TRAY) ×2 IMPLANT
PAD ARMBOARD 7.5X6 YLW CONV (MISCELLANEOUS) ×2 IMPLANT
PREVENA RESTOR ARTHOFORM 46X30 (CANNISTER) ×2 IMPLANT
PREVENA RESTOR AXIOFORM 29X28 (GAUZE/BANDAGES/DRESSINGS) ×1 IMPLANT
SPONGE LAP 18X18 RF (DISPOSABLE) ×2 IMPLANT
STAPLER VISISTAT 35W (STAPLE) ×1 IMPLANT
STOCKINETTE IMPERVIOUS LG (DRAPES) ×2 IMPLANT
SUT ETHILON 2 0 PSLX (SUTURE) ×2 IMPLANT
SUT SILK 2 0 (SUTURE) ×2
SUT SILK 2-0 18XBRD TIE 12 (SUTURE) ×1 IMPLANT
SUT VIC AB 1 CTX 27 (SUTURE) ×4 IMPLANT
TOWEL GREEN STERILE (TOWEL DISPOSABLE) ×2 IMPLANT
TUBE CONNECTING 12X1/4 (SUCTIONS) ×2 IMPLANT
YANKAUER SUCT BULB TIP NO VENT (SUCTIONS) ×2 IMPLANT

## 2021-03-17 NOTE — Op Note (Signed)
   Date of Surgery: 03/17/2021  INDICATIONS: Mr. Overfield is a 80 y.o.-year-old male who is status post foot salvage intervention on the right with 1/5 ray amputation and tibial talar fusion.  Patient has had progressive ischemic changes with dry gangrene over the heel first metatarsal head and lateral incision.  Due to the progressive gangrenous changes patient does not have limb salvage options at this time and he presents for transtibial amputation.Marland Kitchen  PREOPERATIVE DIAGNOSIS: Gangrene right foot  POSTOPERATIVE DIAGNOSIS: Same.  PROCEDURE: Transtibial amputation Application of Prevena wound VAC  SURGEON: Sharol Given, M.D.  ANESTHESIA:  general  IV FLUIDS AND URINE: See anesthesia records.  ESTIMATED BLOOD LOSS: See anesthesia records.  COMPLICATIONS: None.  DESCRIPTION OF PROCEDURE: The patient was brought to the operating room after undergoing regional anesthetic. After adequate levels of anesthesia were obtained patient's lower extremity was prepped using DuraPrep draped into a sterile field. A timeout was called. The foot was draped out of the sterile field with impervious stockinette. A transverse incision was made 11 cm distal to the tibial tubercle. This curved proximally and a large posterior flap was created. The tibia was transected 1 cm proximal to the skin incision. The fibula was transected just proximal to the tibial incision. The tibia was beveled anteriorly. A large posterior flap was created. The sciatic nerve was pulled cut and allowed to retract. The vascular bundles were suture ligated with 2-0 silk. The deep and superficial fascial layers were closed using #1 Vicryl. The skin was closed using staples and 2-0 nylon. The wound was covered with a Prevena customizable and arthroform wound VAC.  The dressing was sealed with dermatac there was a good suction fit. A prosthetic shrinker will be applied in patient's room. Patient was taken to the PACU in stable condition.   DISCHARGE  PLANNING:  Antibiotic duration: 24 hours  Weightbearing: Nonweightbearing on the operative extremity  Pain medication: Opioid pathway  Dressing care/ Wound VAC: Continue wound VAC for 1 week after discharge  Discharge to: Discharge planning based on therapy's recommendations for possible inpatient rehabilitation, outpatient rehabilitation, or discharge to home with therapy  Follow-up: In the office 1 week post operative.  Meridee Score, MD [place] 9:31 AM

## 2021-03-17 NOTE — H&P (Signed)
Shane [name] is an 80 y.o. male.   Chief Complaint: right foot osteomyelitis HPI: Patient is a 80 year old gentleman who presents in follow-up status post right foot salvage intervention.  Patient underwent 1/5 ray amputation and with the fixed cavovarus deformity of the ankle he underwent ankle fusion to provide him a plantigrade foot.  Patient has had progressive ischemic changes to his foot with a progressive ischemic changes over the fifth ray amputation a large decubitus dry gangrenous ulcer as well as an ulcer over the first metatarsal head.  There is also serosanguineous drainage from the ankle fusion incision.  Past Medical History:  Diagnosis Date  . Anxiety   . 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 Cataract   . Chronic kidney disease    d/t DM  . CKD (chronic kidney disease), stage III ([place])   . Depression   . Diabetes mellitus    Vgo disposal insulin bolus  simular to insulin pump  . Dyspnea   . GERD (gastroesophageal reflux disease)   . History of kidney stones    passed  . Hyperlipidemia   . Hypertension   . Idiopathic pulmonary fibrosis ([place]) 11/2016  . ILD (interstitial lung disease) ([place])   . Moderate aortic stenosis    a. 10/2019 Echo: EF 55-60%, Gr2 DD. Nl RV.   Marland Kitchen Neuromuscular disorder ([place])   . Neuropathy associated with endocrine disorder ([place])   . Nonobstructive CAD (coronary artery disease)    a. 2012 Cath: mod, nonobs dzs; b. 10/2016 MV: EF 60%, no ischemia.  . OSA on CPAP 05/05/2017   Unattended Home Sleep Test 7/2/813-AHI 38.6/hour, desaturation to 64%, body weight 261 pounds  . PONV (postoperative nausea and vomiting)   . Sleep apnea     uses cpap asked to bring mask and tubing    Past Surgical History:  Procedure Laterality Date  . ABDOMINAL AORTOGRAM W/LOWER EXTREMITY N/A 12/10/2020   Procedure: ABDOMINAL AORTOGRAM W/LOWER EXTREMITY;  Surgeon: Cherre Robins, MD;  Location: [place] [facility];  Service: Cardiovascular;  Laterality: N/A;  . AMPUTATION Right  01/22/2021   Procedure: 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;  Surgeon: [name], MD;  Location: [place];  Service: Orthopedics;  Laterality: Right;  . ANKLE FUSION Right 01/22/2021   Procedure: RIGHT FOOT TIBIOCALCANEAL FUSION;  Surgeon: [name], MD;  Location: [place];  Service: Orthopedics;  Laterality: Right;  . ANTERIOR FUSION CERVICAL SPINE  2012  . CARDIAC CATHETERIZATION  2011  . CARDIAC CATHETERIZATION N/A 11/09/2016   Procedure: Right Heart Cath;  Surgeon: Belva Crome, MD;  Location: [place] [facility];  Service: Cardiovascular;  Laterality: N/A;  . carpel tunnel     left wrist  . CATARACT EXTRACTION    . CATARACT EXTRACTION W/ INTRAOCULAR LENS  IMPLANT, BILATERAL  2013  . CERVICAL LAMINECTOMY  2012  . COLONOSCOPY N/A 01/14/2013   Procedure: COLONOSCOPY;  Surgeon: Irene Shipper, MD;  Location: [facility];  Service: Endoscopy;  Laterality: N/A;  . CORONARY ARTERY BYPASS GRAFT  11/04/2020   LIMA-LAD, SVG-OM1, SVG-PDA (Dr Marney [facility]) dc 11/18/2020  . EYE SURGERY    . I & D EXTREMITY Right 02/19/2021   Procedure: RIGHT ANKLE DEBRIDEMENT AND PLACEMENT ANTIBIOTIC BEADS;  Surgeon: [name], MD;  Location: [place];  Service: Orthopedics;  Laterality: Right;  . [place]   left  . LEFT HEART CATH AND CORONARY ANGIOGRAPHY N/A 07/10/2020   Procedure: LEFT HEART CATH AND CORONARY  ANGIOGRAPHY;  Surgeon: Sherren Mocha, MD;  Location: [place] [facility];  Service: Cardiovascular;  Laterality: N/A;  . LUMBAR LAMINECTOMY  2003  . LUNG BIOPSY Left 12/26/2016   Procedure: LUNG BIOPSY;  Surgeon: [name], MD;  Location: [place];  Service: Thoracic;  Laterality: Left;  . PACEMAKER IMPLANT N/A 03/30/2020   Procedure: PACEMAKER IMPLANT;  Surgeon: Evans Lance, MD;  Location: [place] [facility];  Service: Cardiovascular;  Laterality: N/A;  . PERIPHERAL VASCULAR INTERVENTION Right 12/10/2020   Procedure: PERIPHERAL VASCULAR INTERVENTION;  Surgeon: Cherre Robins, MD;  Location:  [place] [facility];  Service: Cardiovascular;  Laterality: Right;  SFA  . POSTERIOR FUSION CERVICAL SPINE  2012  . PPM GENERATOR REMOVAL N/A 12/14/2020   Procedure: PPM GENERATOR REMOVAL;  Surgeon: Evans Lance, MD;  Location: [place] [facility];  Service: Cardiovascular;  Laterality: N/A;  . TEE WITHOUT CARDIOVERSION N/A 12/11/2020   Procedure: TRANSESOPHAGEAL ECHOCARDIOGRAM (TEE);  Surgeon: Geralynn Rile, MD;  Location: [place];  Service: Cardiovascular;  Laterality: N/A;  . TRIGGER FINGER RELEASE  2011   4th finger left hand  . VIDEO ASSISTED THORACOSCOPY Left 12/26/2016   Procedure: VIDEO ASSISTED THORACOSCOPY;  Surgeon: [name], MD;  Location: [place];  Service: Thoracic;  Laterality: Left;  Marland Kitchen VIDEO BRONCHOSCOPY N/A 12/26/2016   Procedure: VIDEO BRONCHOSCOPY;  Surgeon: [name], MD;  Location: [facility];  Service: Thoracic;  Laterality: N/A;    Family History  Problem Relation Age of Onset  . Diabetes Mellitus II Mother   . Emphysema Father 68  . Heart attack Father   . Colon cancer Neg Hx   . Esophageal cancer Neg Hx   . Rectal cancer Neg Hx   . Stomach cancer Neg Hx    Social History:  reports that he has never smoked. He has never used smokeless tobacco. He reports that he does not drink alcohol and does not use drugs.  Allergies:  Allergies  Allergen Reactions  . Codeine Hives and Itching  . Ofev [Nintedanib] Diarrhea    SEVERE DIARRHEA  . Other Diarrhea and Other (See Comments)    Esbriet causes elevated LFTs. D/C on 06/14/17 and SEVERE DIARRHEA    Medications Prior to Admission  Medication Sig Dispense Refill  . albuterol (VENTOLIN HFA) 108 (90 Base) MCG/ACT inhaler Inhale 2 puffs into the lungs every 4 (four) hours as needed for wheezing or shortness of breath.    Marland Kitchen apixaban (ELIQUIS) 5 MG TABS tablet Take 1 tablet (5 mg total) by mouth 2 (two) times daily. 60 tablet 11  . Ascorbic Acid (VITAMIN C WITH ROSE HIPS) 500 MG tablet Take 500  mg by mouth daily.    Marland Kitchen aspirin EC 81 MG tablet Take 81 mg by mouth daily.    Marland Kitchen atorvastatin (LIPITOR) 40 MG tablet Take 20 mg by mouth in the morning and at bedtime.    . ciprofloxacin (CIPRO) 500 MG tablet Take 1 tablet (500 mg total) by mouth 2 (two) times daily. 60 tablet 0  . Collagenase (SANTYL EX) Apply 1 application topically 2 (two) times daily.    . diclofenac (VOLTAREN) 75 MG EC tablet Take 75 mg by mouth 2 (two) times daily as needed for pain.    . diphenhydrAMINE HCl, Sleep, (ZZZQUIL) 25 MG CAPS Take 25 mg by mouth at bedtime as needed (sleep).    . Ensure Max Protein (ENSURE MAX PROTEIN) LIQD Take 330 mLs (11 oz total) by  mouth 2 (two) times daily. 3330 mL 0  . escitalopram (LEXAPRO) 10 MG tablet Take 10 mg by mouth daily.    Marland Kitchen FARXIGA 10 MG TABS tablet Take 10 mg by mouth daily.    . furosemide (LASIX) 40 MG tablet Take 40 mg by mouth daily.    Marland Kitchen HYDROcodone-acetaminophen (NORCO/VICODIN) 5-325 MG tablet Take 1 tablet by mouth every 4 (four) hours as needed. (Patient taking differently: Take 1 tablet by mouth every 4 (four) hours as needed for moderate pain.) 30 tablet 0  . insulin lispro (HUMALOG) 100 UNIT/ML injection Inject 0.07 mLs (7 Units total) into the skin 3 (three) times daily with meals. (Patient taking differently: Inject 10 Units into the skin 3 (three) times daily with meals.) 10 mL 0  . iron polysaccharides (NIFEREX) 150 MG capsule Take 150 mg by mouth daily with lunch.    Marland Kitchen LORazepam (ATIVAN) 1 MG tablet Take 1 mg by mouth at bedtime.    Marland Kitchen losartan (COZAAR) 50 MG tablet Take 25 mg by mouth daily.    . magnesium oxide (MAG-OX) 400 MG tablet TAKE 1 CAPSULE BY MOUTH ONCE DAILY (Patient taking differently: Take 400 mg by mouth daily.) 90 tablet 1  . metoprolol tartrate (LOPRESSOR) 50 MG tablet Take 25 mg by mouth daily.    . Multiple Vitamin (MULTIVITAMIN WITH MINERALS) TABS tablet Take 1 tablet by mouth daily.    . Olopatadine HCl (PATADAY OP) Place 1 drop into both eyes  daily as needed (allergies).    . ondansetron (ZOFRAN) 4 MG tablet TAKE 1 TABLET BY MOUTH EVERY 8 HOURS AS NEEDED FOR NAUSEA AND VOMITING (Patient taking differently: Take 4 mg by mouth every 8 (eight) hours as needed for nausea or vomiting.) 20 tablet 0  . pantoprazole (PROTONIX) 40 MG tablet Take 40 mg by mouth daily.    . polyethylene glycol powder (GLYCOLAX/MIRALAX) 17 GM/SCOOP powder Take 17 g by mouth daily as needed for moderate constipation.    . Probiotic Product (PROBIOTIC PO) Take 1 capsule by mouth daily.    . Promethazine-Phenyleph-Codeine (PROMETHAZINE VC/CODEINE) 6.25-5-10 MG/5ML SYRP Take 5 mLs by mouth every 6 (six) hours as needed for cough.    . zinc sulfate 220 (50 Zn) MG capsule Take 220 mg by mouth daily.    . Continuous Blood Gluc Sensor (FREESTYLE LIBRE 14 DAY SENSOR) MISC 1 patch every 14 (fourteen) days.      Results for orders placed or performed during the hospital encounter of 03/16/21 (from the past 48 hour(s))  SARS CORONAVIRUS 2 (TAT 6-24 HRS) Nasopharyngeal Nasopharyngeal Swab     Status: None   Collection Time: 03/16/21  1:32 PM   Specimen: Nasopharyngeal Swab  Result Value Ref Range   SARS Coronavirus 2 NEGATIVE NEGATIVE    Comment: (NOTE) SARS-CoV-2 target nucleic acids are NOT DETECTED.  The SARS-CoV-2 RNA is generally detectable in upper and lower respiratory specimens during the acute phase of infection. Negative results do not preclude SARS-CoV-2 infection, do not rule out co-infections with other pathogens, and should not be used as the sole basis for treatment or other patient management decisions. Negative results must be combined with clinical observations, patient history, and epidemiological information. The expected result is Negative.  Fact Sheet for Patients: SugarRoll.be  Fact Sheet for Healthcare Providers: https://www.woods-mathews.com/  This test is not yet approved or cleared by the [place] FDA and  has been authorized for detection and/or diagnosis of SARS-CoV-2 by FDA under an Emergency Use Authorization (  EUA). This EUA will remain  in effect (meaning this test can be used) for the duration of the COVID-19 declaration under Se ction 564(b)(1) of the Act, 21 U.S.C. section 360bbb-3(b)(1), unless the authorization is terminated or revoked sooner.  Performed at [facility], [place] [address], [place], [place] [address]    No results found.  Review of Systems  All other systems reviewed and are negative.   There were no vitals taken for this visit. Physical Exam  Patient is alert, oriented, no adenopathy, well-dressed, normal affect, normal respiratory effort. Examination patient has progressive gangrenous changes to the right heel with full-thickness dry gangrenous changes down to the calcaneus.  The lateral incision has progressive gangrenous changes as well with exposed bone and full-thickness necrotic changes.  The ischemic gangrenous ulcer over the MTP joint medially is resolving.  Patient has been on Cipro and has been undergoing physical therapy 2 times a week.Heart RRR Lungs Clear Assessment/Plan 1. 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)   2. S/P ankle fusion   3. Gangrene of right foot ([place])     Plan: Discussed with the patient the progressive gangrenous changes of the lateral aspect of the right foot with exposed bone and the full-thickness ischemic ulcer over the heel and slow healing of the anterior incision patient has progressive worsening of the foot and ankle secondary to his microcirculatory disease.  Have recommended proceeding with a transtibial amputation.  Patient states he understands wishes to proceed with surgery next Wednesday.  I have called his primary care physician Dr. [name] and relayed the clinical findings.  Patient states he ideally would like to [facility].   Bevely Palmer  Japheth Diekman, PA 03/17/2021, 6:33 AM

## 2021-03-17 NOTE — Anesthesia Procedure Notes (Signed)
Anesthesia Regional Block: Adductor canal block   Pre-Anesthetic Checklist: ,, timeout performed, Correct Patient, Correct Site, Correct Laterality, Correct Procedure, Correct Position, site marked, Risks and benefits discussed,  Surgical consent,  Pre-op evaluation,  At surgeon's request and post-op pain management  Laterality: Right  Prep: chloraprep       Needles:  Injection technique: Single-shot  Needle Type: Echogenic Stimulator Needle     Needle Length: 10cm  Needle Gauge: 20     Additional Needles:   Procedures:,,,, ultrasound used (permanent image in chart),,,,  Narrative:  Start time: 03/17/2021 6:57 AM End time: 03/17/2021 7:01 AM Injection made incrementally with aspirations every 5 mL.  Performed by: Personally  Anesthesiologist: Lidia Collum, MD  Additional Notes: Standard monitors applied. Skin prepped. Good needle visualization with ultrasound. Injection made in 5cc increments with no resistance to injection. Patient tolerated the procedure well.

## 2021-03-17 NOTE — Progress Notes (Signed)
Orthopedic Tech Progress Note Patient Details:  Shane [name] 10-30-1941 [id number] Patient has [facility] PER [facility] Patient ID: [name], male   DOB: 1941-10-25, 80 y.o.   MRN: [id number]   [name] 03/17/2021, 3:21 PM

## 2021-03-17 NOTE — Interval H&P Note (Signed)
History and Physical Interval Note:  03/17/2021 6:43 AM  Shane [name]  has presented today for surgery, with the diagnosis of Dehiscence Wounds Right Foot.  The various methods of treatment have been discussed with the patient and family. After consideration of risks, benefits and other options for treatment, the patient has consented to  Procedure(s): RIGHT BELOW KNEE AMPUTATION (Right) as a surgical intervention.  The patient's history has been reviewed, patient examined, no change in status, stable for surgery.  I have reviewed the patient's chart and labs.  Questions were answered to the patient's satisfaction.     [name]

## 2021-03-17 NOTE — Transfer of Care (Signed)
Immediate Anesthesia Transfer of Care Note  Patient: Shane [name]  Procedure(s) Performed: RIGHT BELOW KNEE AMPUTATION (Right Knee)  Patient Location: PACU  Anesthesia Type:MAC combined with regional for post-op pain  Level of Consciousness: awake, alert  and oriented  Airway & Oxygen Therapy: Patient Spontanous Breathing  Post-op Assessment: Report given to RN, Post -op Vital signs reviewed and stable and Patient moving all extremities  Post vital signs: Reviewed and stable  Last Vitals:  Vitals Value Taken Time  BP 131/121 03/17/21 0926  Temp    Pulse 53 03/17/21 0926  Resp 7 03/17/21 0927  SpO2 100 % 03/17/21 0926  Vitals shown include unvalidated device data.  Last Pain:  Vitals:   03/17/21 0715  TempSrc:   PainSc: 0-No pain         Complications: No complications documented.

## 2021-03-17 NOTE — Progress Notes (Signed)
During assessment at this time, pt's wound vac dressing and bed sheets are saturated with blood. Pt's sheets were changed.This RN and charge nurse reinforced dressing at this time. Wound vac is still on and plugged into wall.

## 2021-03-17 NOTE — Plan of Care (Signed)
  Problem: Education: Goal: Knowledge of General Education information will improve Description: Including pain rating scale, medication(s)/side effects and non-pharmacologic comfort measures Outcome: Progressing   Problem: Health Behavior/Discharge Planning: Goal: Ability to manage health-related needs will improve Outcome: Progressing   Problem: Nutrition: Goal: Adequate nutrition will be maintained Outcome: Progressing   Problem: Pain Managment: Goal: General experience of comfort will improve Outcome: Progressing   

## 2021-03-17 NOTE — Anesthesia Procedure Notes (Signed)
Procedure Name: MAC Date/Time: 03/17/2021 8:39 AM Performed by: Rande Brunt, CRNA Pre-anesthesia Checklist: Patient identified, Emergency Drugs available, Suction available and Patient being monitored Patient Re-evaluated:Patient Re-evaluated prior to induction Oxygen Delivery Method: Simple face mask Preoxygenation: Pre-oxygenation with 100% oxygen Induction Type: IV induction Placement Confirmation: positive ETCO2 and CO2 detector

## 2021-03-17 NOTE — Anesthesia Procedure Notes (Signed)
Anesthesia Regional Block: Popliteal block   Pre-Anesthetic Checklist: ,, timeout performed, Correct Patient, Correct Site, Correct Laterality, Correct Procedure, Correct Position, site marked, Risks and benefits discussed,  Surgical consent,  Pre-op evaluation,  At surgeon's request and post-op pain management  Laterality: Right  Prep: chloraprep       Needles:  Injection technique: Single-shot  Needle Type: Echogenic Stimulator Needle     Needle Length: 10cm  Needle Gauge: 20     Additional Needles:   Procedures:,,,, ultrasound used (permanent image in chart),,,,  Narrative:  Start time: 03/17/2021 7:01 AM End time: 03/17/2021 7:04 AM  Performed by: Personally  Anesthesiologist: Lidia Collum, MD  Additional Notes: Standard monitors applied. Skin prepped. Good needle visualization with ultrasound. Injection made in 5cc increments with no resistance to injection. Patient tolerated the procedure well.

## 2021-03-17 NOTE — Anesthesia Postprocedure Evaluation (Signed)
Anesthesia Post Note  Patient: Shane [name]  Procedure(s) Performed: RIGHT BELOW KNEE AMPUTATION (Right Knee)     Patient location during evaluation: PACU Anesthesia Type: Regional Level of consciousness: awake and alert Pain management: pain level controlled Vital Signs Assessment: post-procedure vital signs reviewed and stable Respiratory status: spontaneous breathing, nonlabored ventilation and respiratory function stable Cardiovascular status: blood pressure returned to baseline and stable Postop Assessment: no apparent nausea or vomiting Anesthetic complications: no   No complications documented.  Last Vitals:  Vitals:   03/17/21 1440 03/17/21 1514  BP: (!) 105/51 (!) 100/59  Pulse: (!) 58 (!) 57  Resp: (!) 21 15  Temp: 36.5 C 36.5 C  SpO2: 100% 100%    Last Pain:  Vitals:   03/17/21 1514  TempSrc: Oral  PainSc:                  Lidia Collum

## 2021-03-17 NOTE — Progress Notes (Signed)
Pt came up to 5N with wound vac dressing leaking. We ordered extra dressing supplies to reinforce. Dr. Sharol Given was made aware by PACU nurse Lanelle Bal, and he stated he was going to come take a look at pt. Vitals stable. Pt alert and oriented and in no distress. Complains of very little pain right now.

## 2021-03-17 NOTE — TOC Initial Note (Signed)
Transition of Care [facility]) - Initial/Assessment Note    Patient Details  Name: Shane [name] MRN: [id number] Date of Birth: 24-Feb-1941  Transition of Care [facility]) CM/SW Contact:    Sharin Mons, RN Phone Number: 03/17/2021, 4:08 PM  Clinical Narrative:                 Readmitted s/p Transtibial amputation and  Application of Prevena wound VAC, 03/17/2021 secondary to right foot gangrene. From home with wife. Active with [facility], RN,PT.   PT/OT evaluations pending ....  TOC team following and will assist with TOC needs.  Expected Discharge Plan: [place] Barriers to Discharge: Continued Medical Work up   Patient Goals and CMS Choice        Expected Discharge Plan and Services Expected Discharge Plan: [place]                                              Prior Living Arrangements/Services     Patient language and need for interpreter reviewed:: Yes Do you feel safe going back to the place where you live?: Yes      Need for Family Participation in Patient Care: Yes (Comment) Care giver support system in place?: Yes (comment)   Criminal Activity/Legal Involvement Pertinent to Current Situation/Hospitalization: No - Comment as needed  Activities of Daily Living Home Assistive Devices/Equipment: CBG Meter,Other (Comment),Electric scooter,Wheelchair,Walker (specify type),Cane (specify quad or straight),Blood pressure cuff,CPAP,Grab bars around toilet,Grab bars in shower,Shower chair with back,Raised toilet seat with rails (continuous glucose monitor) ADL Screening (condition at time of admission) Patient's cognitive ability adequate to safely complete daily activities?: Yes Is the patient deaf or have difficulty hearing?: Yes Does the patient have difficulty seeing, even when wearing glasses/contacts?: No Does the patient have difficulty concentrating, remembering, or making decisions?: No Patient able to  express need for assistance with ADLs?: Yes Does the patient have difficulty dressing or bathing?: Yes Independently performs ADLs?: No Communication: Independent Dressing (OT): Needs assistance Is this a change from baseline?: Pre-admission baseline Grooming: Independent Feeding: Independent Bathing: Needs assistance Is this a change from baseline?: Pre-admission baseline Toileting: Independent In/Out Bed: Independent with device (comment) Walks in Home: Independent with device (comment) Does the patient have difficulty walking or climbing stairs?: Yes Weakness of Legs: Right Weakness of Arms/Hands: None  Permission Sought/Granted   Permission granted to share information with : Yes, Verbal Permission Granted  Share Information with NAME: Jadarious Dobbins (Spouse) [phone]           Emotional Assessment Appearance:: Appears stated age Attitude/Demeanor/Rapport: Engaged Affect (typically observed): Calm Orientation: : Oriented to Self,Oriented to Place,Oriented to  Time,Oriented to Situation Alcohol / Substance Use: Not Applicable Psych Involvement: No (comment)  Admission diagnosis:  Abscess of bursa of right ankle [M71.071] Patient Active Problem List   Diagnosis Date Noted  . Atherosclerosis of native arteries of extremities with gangrene, right leg ([place])   . Hardware complicating wound infection ([place])   . Abscess of bursa of right ankle 02/15/2021  . 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 Peripheral vascular disease ([place]) 12/29/2020  . CHF (congestive heart failure), NYHA class II, chronic, diastolic ([place]) 90/24/0973  . History of COVID-19 12/22/2020  . SSS (sick sinus syndrome) ([place]) 12/22/2020  . Acute bacterial endocarditis   . MSSA bacteremia 12/09/2020  . Diabetic foot ulcer (  [place]) 12/09/2020  . Foot drop, right foot   . Generalized weakness 12/08/2020  . Dehydration with hyponatremia 12/08/2020  . Atrial fibrillation, chronic ([place]) 12/08/2020  . Elevated troponin  level not due myocardial infarction 12/08/2020  . Adrenal insufficiency ([place]) 11/14/2020  . Orthostatic hypotension 11/14/2020  . Physical deconditioning 11/14/2020  . Difficulty sleeping 11/07/2020  . Postoperative anemia due to acute blood loss 11/07/2020  . Right ventricular dysfunction 11/05/2020  . 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 Hearing loss 11/03/2020  . Cardiac device in situ 09/09/2020  . Coronary atherosclerosis due to lipid rich plaque 09/02/2020  . COVID-19 virus infection 07/18/2020  . Coronary artery disease involving native heart without angina pectoris 07/10/2020  . Chronic kidney disease, stage 3a ([place]) 03/29/2020  . 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 ([place]) 03/16/2020  . Right epiretinal membrane 03/16/2020  . Posterior vitreous detachment of right eye 03/16/2020  . Aortic stenosis, moderate 03/12/2020  . 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 Near syncope 04/27/2018  . Hypoglycemia due to insulin 01/06/2018  . Essential hypertension 01/06/2018  . Diabetes mellitus type 2, with complication, on long term insulin pump ([place]) 01/06/2018  . Syncope and collapse 01/05/2018  . Encounter for therapeutic drug monitoring 06/29/2017  . OSA on CPAP 05/05/2017  . 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[place]) 01/05/2017  . Abnormal chest x-ray 10/12/2016  . Benign neoplasm of colon 01/14/2013   PCP:  Burnard Bunting, MD Pharmacy:   [id number] - [place], [place] [address] [place] [address] Phone: [phone] Fax: [phone]  [facility] - [place], [place] [address] [place] [address] Phone: [phone] Fax:  [phone]     Social Determinants of Health (SDOH) Interventions    Readmission Risk Interventions No flowsheet data found.

## 2021-03-18 ENCOUNTER — Encounter (HOSPITAL_COMMUNITY): Payer: Self-pay | Admitting: Orthopedic Surgery

## 2021-03-18 DIAGNOSIS — R578 Other shock: Secondary | ICD-10-CM | POA: Diagnosis not present

## 2021-03-18 LAB — BASIC METABOLIC PANEL
Anion gap: 9 (ref 5–15)
Anion gap: 9 (ref 5–15)
BUN: 42 mg/dL — ABNORMAL HIGH (ref 8–23)
BUN: 43 mg/dL — ABNORMAL HIGH (ref 8–23)
CO2: 27 mmol/L (ref 22–32)
CO2: 26 mmol/L (ref 22–32)
Calcium: 8.1 mg/dL — ABNORMAL LOW (ref 8.9–10.3)
Calcium: 8 mg/dL — ABNORMAL LOW (ref 8.9–10.3)
Chloride: 94 mmol/L — ABNORMAL LOW (ref 98–111)
Chloride: 95 mmol/L — ABNORMAL LOW (ref 98–111)
Creatinine, Ser: 1.67 mg/dL — ABNORMAL HIGH (ref 0.61–1.24)
Creatinine, Ser: 1.78 mg/dL — ABNORMAL HIGH (ref 0.61–1.24)
GFR, Estimated: 41 mL/min — ABNORMAL LOW (ref >60)
GFR, Estimated: 38 mL/min — ABNORMAL LOW (ref >60)
Glucose, Bld: 220 mg/dL — ABNORMAL HIGH (ref 70–99)
Glucose, Bld: 250 mg/dL — ABNORMAL HIGH (ref 70–99)
Potassium: 5.4 mmol/L — ABNORMAL HIGH (ref 3.5–5.1)
Potassium: 5.5 mmol/L — ABNORMAL HIGH (ref 3.5–5.1)
Sodium: 130 mmol/L — ABNORMAL LOW (ref 135–145)
Sodium: 130 mmol/L — ABNORMAL LOW (ref 135–145)

## 2021-03-18 LAB — CBC
HCT: 19.2 % — ABNORMAL LOW (ref 39.0–52.0)
HCT: 26.1 % — ABNORMAL LOW (ref 39.0–52.0)
Hemoglobin: 6 g/dL — CL (ref 13.0–17.0)
Hemoglobin: 8.5 g/dL — ABNORMAL LOW (ref 13.0–17.0)
MCH: 27.4 pg (ref 26.0–34.0)
MCH: 28.7 pg (ref 26.0–34.0)
MCHC: 31.3 g/dL (ref 30.0–36.0)
MCHC: 32.6 g/dL (ref 30.0–36.0)
MCV: 87.7 fL (ref 80.0–100.0)
MCV: 88.2 fL (ref 80.0–100.0)
Platelets: 254 10*3/uL (ref 150–400)
Platelets: 289 10*3/uL (ref 150–400)
RBC: 2.19 MIL/uL — ABNORMAL LOW (ref 4.22–5.81)
RBC: 2.96 MIL/uL — ABNORMAL LOW (ref 4.22–5.81)
RDW: 15.3 % (ref 11.5–15.5)
RDW: 14.6 % (ref 11.5–15.5)
WBC: 9 10*3/uL (ref 4.0–10.5)
WBC: 13.1 10*3/uL — ABNORMAL HIGH (ref 4.0–10.5)
nRBC: 0 % (ref 0.0–0.2)
nRBC: 0 % (ref 0.0–0.2)

## 2021-03-18 LAB — GLUCOSE, CAPILLARY
Glucose-Capillary: 214 mg/dL — ABNORMAL HIGH (ref 70–99)
Glucose-Capillary: 229 mg/dL — ABNORMAL HIGH (ref 70–99)
Glucose-Capillary: 251 mg/dL — ABNORMAL HIGH (ref 70–99)
Glucose-Capillary: 249 mg/dL — ABNORMAL HIGH (ref 70–99)

## 2021-03-18 LAB — MRSA PCR SCREENING: MRSA by PCR: NEGATIVE

## 2021-03-18 LAB — PREPARE RBC (CROSSMATCH)

## 2021-03-18 MED ORDER — CHLORHEXIDINE GLUCONATE CLOTH 2 % EX PADS
6.0000 | MEDICATED_PAD | Freq: Every day | CUTANEOUS | Status: DC
  Administered 2021-03-18 – 2021-03-26 (×5): 6 via TOPICAL

## 2021-03-18 MED ORDER — NOREPINEPHRINE 4 MG/250ML-% IV SOLN
2.0000 ug/min | INTRAVENOUS | Status: DC
  Administered 2021-03-18: 2 ug/min via INTRAVENOUS
  Administered 2021-03-18: 5 ug/min via INTRAVENOUS
  Administered 2021-03-18: 3 ug/min via INTRAVENOUS
  Administered 2021-03-19: 5 ug/min via INTRAVENOUS
  Filled 2021-03-18 (×2): qty 250

## 2021-03-18 MED ORDER — SODIUM CHLORIDE 0.9% IV SOLUTION: Freq: Once | INTRAVENOUS | Status: AC

## 2021-03-18 MED ORDER — SODIUM CHLORIDE 0.9 % IV BOLUS
500.0000 mL | Freq: Once | INTRAVENOUS | Status: AC | PRN
  Administered 2021-03-18: 500 mL via INTRAVENOUS

## 2021-03-18 MED ORDER — SODIUM CHLORIDE 0.9 % IV SOLN: 250.0000 mL | INTRAVENOUS | Status: DC

## 2021-03-18 MED ORDER — SODIUM ZIRCONIUM CYCLOSILICATE 10 G PO PACK
10.0000 g | PACK | Freq: Once | ORAL | Status: AC
  Administered 2021-03-18: 10 g via ORAL
  Filled 2021-03-18: qty 1

## 2021-03-18 MED ORDER — INSULIN GLARGINE 100 UNIT/ML ~~LOC~~ SOLN
8.0000 [IU] | Freq: Every day | SUBCUTANEOUS | Status: DC
  Administered 2021-03-18 – 2021-03-23 (×6): 8 [IU] via SUBCUTANEOUS
  Filled 2021-03-18 (×8): qty 0.08

## 2021-03-18 MED ORDER — ONDANSETRON HCL 4 MG/2ML IJ SOLN
4.0000 mg | Freq: Once | INTRAMUSCULAR | Status: AC
  Administered 2021-03-18: 4 mg via INTRAVENOUS

## 2021-03-18 NOTE — Progress Notes (Signed)
Ordered 2 units of blood (verbal order from [place], PA-C of [place])

## 2021-03-18 NOTE — Progress Notes (Signed)
Inpatient Rehab Admissions Coordinator:   Awaiting PT/OT evaluations for therapy recommendations.   Shann Medal, PT, DPT Admissions Coordinator [phone] 03/18/21  12:07 PM

## 2021-03-18 NOTE — Progress Notes (Signed)
Pt BP was 78/47 when I came in room to give blood and tech notified me that blood pressure was low. Pt states he is light headed and vision is blurry. I called my charge Nurse to let her know. Rapid response was called and told me to start blood. Dr. Sharol Given has been paged. Still awaiting response from Dr. Sharol Given.   03/18/21 1149  Assess: MEWS Score  Temp 98.2 F (36.8 C)  BP (!) 78/47  Pulse Rate 90  Resp 16  Level of Consciousness Alert  SpO2 100 %  O2 Device Room Air  Assess: MEWS Score  MEWS Temp 0  MEWS Systolic 2  MEWS Pulse 0  MEWS RR 0  MEWS LOC 0  MEWS Score 2  MEWS Score Color Yellow  Assess: if the MEWS score is Yellow or Red  Were vital signs taken at a resting state? Yes  Focused Assessment Change from prior assessment (see assessment flowsheet)  Early Detection of Sepsis Score *See Row Information* Low  MEWS guidelines implemented *See Row Information* Yes  Treat  Pain Scale 0-10  Pain Score 2  Take Vital Signs  Increase Vital Sign Frequency  Yellow: Q 2hr X 2 then Q 4hr X 2, if remains yellow, continue Q 4hrs  Escalate  MEWS: Escalate Yellow: discuss with charge nurse/RN and consider discussing with provider and RRT  Notify: Charge Nurse/RN  Name of Charge Nurse/RN Notified Charlean Sanfilippo  Date Charge Nurse/RN Notified 03/18/21  Time Charge Nurse/RN Notified 1152  Notify: Provider  Provider Name/Title Dr.Duda  Date Provider Notified 03/18/21  Time Provider Notified 1205  Notification Type Page  Notification Reason Change in status  Notify: Rapid Response  Name of Rapid Response RN Notified [facility] RN  Date Rapid Response Notified 03/18/21  Time Rapid Response Notified 5625  Document  Patient Outcome Other (Comment) (administering blood products)

## 2021-03-18 NOTE — Progress Notes (Signed)
Notified at this time from lab that pt's hemoglobin has dropped to 6.0. Will notify physician.

## 2021-03-18 NOTE — Progress Notes (Signed)
PCCM Progress note   Given persistent hypotension despite transfusion of 2 units packed red blood cells and IV hydration decision was made to initiate vasopressor support.  Vasopressors initiated on medical surgical floor by myself prior to transfer to ICU due to hemodynamic instability.  Levophed was initiated at 2 mcg and titrated up to 5 mcg until MAP goal greater than 65 was reached.  Patient was then transferred to intensive care unit where critical care team including bedside nurse was updated on initiation of vasopressors.  Family aware transfer and plan of care.  Johnsie Cancel, NP-C [facility] Personal contact information can be found on Amion  03/18/2021, 5:03 PM

## 2021-03-18 NOTE — Progress Notes (Signed)
PT Cancellation Note  Patient Details Name: Shane [name] MRN: [id number] DOB: 04/23/1941   Cancelled Treatment:    Reason Eval/Treat Not Completed: Medical issues which prohibited therapy.  Hypotensive and low hgb with RR called.  Follow up as time and pt allow.   Ramond Dial 03/18/2021, 12:44 PM Mee Hives, PT MS Acute Rehab Dept. Number: [place]

## 2021-03-18 NOTE — Significant Event (Addendum)
Rapid Response Event Note   Reason for Call :  Hypotension, blurred vision, dizziness  Initial Focused Assessment:  Pt lying in bed. Alert, oriented- intermittently resting. Skin is pale, warm, dry. Lung sounds are clear. Abdomen is round, soft. Pt denies pain. Endorses blurred vision and dizziness. Intermittent nausea with dry heaving, no emesis production. Neurological assessment completed with no focal deficits noted.   VS: T 97.38F, BP 78/44, HR 88, RR 18, SpO2 100% on room air CBG: 229  Interventions:  -2U PRBC ordered, initiated -500cc IVF bolus, IVF started at 125cc/hr -2nd PIV established  Plan of Care:  -H&H 2 hours post-blood transfusion > 1645 -Transfer to ICU  Event Summary:  MD Notified: PA updated by primary RN Call Time: 1155 Arrival Time: 1200 End Time: 1450  BP 81/59 (66), HR 94, RR 16, SpO2 100% on 2LNC. Pt awake, alert. Endorses feeling better. Vision is still a little blurry, pt is intermittently dizzy. No nausea or emesis. PCCM to bedside to evaluate. Blood transfusion completed. IVF running at 166mL/hr. Primary RN updated. Transfer to PCU vs ICU pending.  Casimer Bilis, RN

## 2021-03-18 NOTE — Consult Note (Signed)
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  Patient recently underwent 1/5 toe amputation with placement of plates to help correct foot drop but unfortunately did not heal from original surgery and developed dry gangrene which prompted need for further amputation including BKA.    Patient underwent right BKA with Dr. Sharol Given 4/13.  Per chart review including anesthesia report patient has an estimated EBL of about 400 mL.  During the case patient received transexamic acid in the setting of chronic anticoagulation (patient is chronically anticoagulated with p.o. Eliquis).  Initially postoperatively patient tolerated procedure well but then morning of 4/14 patient developed hypotension with dizziness, lightheadedness, and blurred vision.  Hemoglobin on AM lab draw about 3 g drop from 9.4-6.0.  Rapid response team called and patient was urgently transfused 2 units packed red blood cells.  On PCCM arrival hemodynamics improving with better symptom control as well.  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 4/13 Admitted for right BKA   Interim History / Subjective:  Seen lying in bed with reports of slight dizziness prior to completion of 2nd blood transfusion.   Objective   Blood pressure (!) 77/56, pulse 93, temperature 97.8 F (36.6 C), temperature source Oral, resp. rate 16, height 6' (1.829 m), weight 108 kg, SpO2 100 %.        Intake/Output Summary (Last 24 hours) at 03/18/2021 1435 Last data filed at 03/18/2021 1400 Gross per 24 hour  Intake 1003.69 ml  Output 1625 ml  Net -621.31 ml   Filed Weights   03/17/21 0645  Weight: 108 kg    Examination: General: Pleasant elderly gentleman lying in bed pale in appearance no acute distress HEENT: [place]/AT, MM pink/moist, PERRL,  Neuro: Alert and oriented x3, nonfocal CV: On auscultation rhythm sounds regular and even, no murmur, rubs, or gallops, patient currently not on telemetry PULM: Clear to auscultation bilaterally, no increased work of breathing, no added breath sounds GI: soft, bowel sounds active in all 4 quadrants, non-tender, non-distended Extremities: Right BKA with Ace bandage wrapped around wound VAC wound VAC output with dark red drainage warm/dry, no edema  Skin: no rashes or lesions  Labs/imaging that I havepersonally reviewed    CBC with hemoglobin drop of 3 g from 9.4-6.0.  CHEM panel NA 130, potassium 3.4, creatinine 1.67  Resolved Hospital Problem list     Assessment & Plan:  Likely hypovolemic shock  -Secondary to excessive blood loss post elective BKA in the setting of chronic anticoagulation therapy -Patient utilizes Eliquis for chronic atrial fibrillation with last dose morning of surgery. -It appears during operative case  patient did receive tranexamic acid, EBL 400 mL P: Aggressive resuscitation to hemodynamic stability Patient is status post 2 units packed red  blood cells Complete 1 L IV fluids as well No acute signs of active bleeding currently, wound VAC in place Begin peripheral Levophed with MAP goal greater than 65 Trend CBC  Maintain 2 large bore PIVs Hold any further anticoagulation or NSAIDs    Chronic A-fib anticoagulated on Eliquis -Last dose of Eliquis was morning of surgery History of type II heart block requiring placement of pacemaker -Unfortunately pacemaker was removed in January of this year due to infected lead Moderate CAD status post CABG -Patient underwent CABG for multivessel disease December 2021 Aortic stenosis History of hyperlipidemia History of hypertension P: Continuous telemetry Optimize electrolytes Hold anticoagulation in setting of recent surgery with bleed Obtain echocardiogram Hold home antihypertensives Continue home statin  Idiopathic pulmonary fibrosis -Follows Dr. Chase Caller in the outpatient setting Obstructive sleep apnea compliant with CPAP P: Encourage pulmonary hygiene CPAP at at bedtime Head of bed elevated 30 degrees. Mobilize as able Supportive care  ID type 2 diabetes -Home medication includes Farxiga and short acting insulin -Hemoglobin A1C 7.8 P: CBG checks every 4 hours SSI  CKD stage IIIb -Baseline creatinine 1.4-1.6 woth GFR mid 40's Hyperkalemia  -Potassium 5.4 on Am labs, on chart reviewno temporizing measures done  P: Ensure adequate renal perfusion with vasopressor support  Repeat bmet when post transfusion H&H obtained  Follow renal function / urine output Trend Bmet Avoid nephrotoxin IV hydration   Best practice   Diet:  Oral Pain/Anxiety/Delirium protocol (if indicated): No VAP protocol (if indicated): Not indicated DVT prophylaxis: Contraindicated GI prophylaxis: N/A Glucose control:  SSI Yes Central venous access:  N/A Arterial line:  N/A Foley:  N/A Mobility:  OOB  PT consulted: Yes Last date of multidisciplinary goals of care discussion Family  update at bedside  Code Status:  full code Disposition: ICU  Labs   CBC: Recent Labs  Lab 03/17/21 0713 03/18/21 0347  WBC 8.1 9.0  HGB 9.4* 6.0*  HCT 30.4* 19.2*  MCV 87.6 87.7  PLT 353 102    Basic Metabolic Panel: Recent Labs  Lab 03/17/21 0713 03/18/21 0347  NA 130* 130*  K 4.4 5.4*  CL 90* 94*  CO2 27 27  GLUCOSE 139* 220*  BUN 53* 42*  CREATININE 1.81* 1.67*  CALCIUM 9.0 8.1*   GFR: Estimated Creatinine Clearance: 45.6 mL/min (A) (by C-G formula based on SCr of 1.67 mg/dL (H)). Recent Labs  Lab 03/17/21 0713 03/18/21 0347  WBC 8.1 9.0    Liver Function Tests: No results for input(s): AST, ALT, ALKPHOS, BILITOT, PROT, ALBUMIN in the last 168 hours. No results for input(s): LIPASE, AMYLASE in the last 168 hours. No results for input(s): AMMONIA in the last 168 hours.  ABG    Component Value Date/Time   PHART 7.362 12/27/2016 0334   PCO2ART 45.4 12/27/2016 0334   PO2ART 115 (H) 12/27/2016 0334   HCO3 25.1 12/27/2016 0334   TCO2 25 02/19/2021 1419   ACIDBASEDEF TEST WILL BE CREDITED 12/19/2016 1333   O2SAT 95.7 12/27/2016 0334     Coagulation Profile: No results for input(s): INR, PROTIME in the last 168 hours.  Cardiac Enzymes: No results for input(s): CKTOTAL, CKMB, CKMBINDEX, TROPONINI in the last 168 hours.  HbA1C: Hgb A1c MFr Bld  Date/Time Value Ref Range Status  02/15/2021 05:54 PM 7.8 (H) 4.8 - 5.6 % Final    Comment:    (  NOTE) Pre diabetes:          5.7%-6.4%  Diabetes:              >6.4%  Glycemic control for   <7.0% adults with diabetes   12/09/2020 03:04 AM 6.6 (H) 4.8 - 5.6 % Final    Comment:    (NOTE) Pre diabetes:          5.7%-6.4%  Diabetes:              >6.4%  Glycemic control for   <7.0% adults with diabetes     CBG: Recent Labs  Lab 03/17/21 0928 03/17/21 1724 03/17/21 2046 03/18/21 0642 03/18/21 1209  GLUCAP 141* 151* 180* 214* 229*    Review of Systems:   Please see the history of present  illness. All other systems reviewed and are negative   Past Medical History:  He,  has a past medical history of Anxiety, AV block, [place] 1, Cataract, Chronic kidney disease, CKD (chronic kidney disease), stage III ([place]), Depression, Diabetes mellitus, Dyspnea, GERD (gastroesophageal reflux disease), History of kidney stones, Hyperlipidemia, Hypertension, Idiopathic pulmonary fibrosis ([place]) (11/2016), ILD (interstitial lung disease) ([place]), Moderate aortic stenosis, Neuromuscular disorder ([place]), Neuropathy associated with endocrine disorder ([place]), Nonobstructive CAD (coronary artery disease), OSA on CPAP (05/05/2017), PONV (postoperative nausea and vomiting), and Sleep apnea.   Surgical History:   Past Surgical History:  Procedure Laterality Date  . ABDOMINAL AORTOGRAM W/LOWER EXTREMITY N/A 12/10/2020   Procedure: ABDOMINAL AORTOGRAM W/LOWER EXTREMITY;  Surgeon: Cherre Robins, MD;  Location: [place] [facility];  Service: Cardiovascular;  Laterality: N/A;  . AMPUTATION Right 01/22/2021   Procedure: 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;  Surgeon: [name], MD;  Location: [facility];  Service: Orthopedics;  Laterality: Right;  . AMPUTATION Right 03/17/2021   Procedure: RIGHT BELOW KNEE AMPUTATION;  Surgeon: [name], MD;  Location: [facility];  Service: Orthopedics;  Laterality: Right;  . ANKLE FUSION Right 01/22/2021   Procedure: RIGHT FOOT TIBIOCALCANEAL FUSION;  Surgeon: [name], MD;  Location: [facility];  Service: Orthopedics;  Laterality: Right;  . ANTERIOR FUSION CERVICAL SPINE  2012  . CARDIAC CATHETERIZATION  2011  . CARDIAC CATHETERIZATION N/A 11/09/2016   Procedure: Right Heart Cath;  Surgeon: Belva Crome, MD;  Location: [facility];  Service: Cardiovascular;  Laterality: N/A;  . carpel tunnel     left wrist  . CATARACT EXTRACTION    . CATARACT EXTRACTION W/ INTRAOCULAR LENS  IMPLANT, BILATERAL  2013  . CERVICAL LAMINECTOMY  2012  . COLONOSCOPY N/A 01/14/2013   Procedure: COLONOSCOPY;   Surgeon: Irene Shipper, MD;  Location: [facility];  Service: Endoscopy;  Laterality: N/A;  . CORONARY ARTERY BYPASS GRAFT  11/04/2020   LIMA-LAD, SVG-OM1, SVG-PDA (Dr Marney Setting [facility]) dc 11/18/2020  . EYE SURGERY    . I & D EXTREMITY Right 02/19/2021   Procedure: RIGHT ANKLE DEBRIDEMENT AND PLACEMENT ANTIBIOTIC BEADS;  Surgeon: [name], MD;  Location: [facility];  Service: Orthopedics;  Laterality: Right;  . [place]   left  . LEFT HEART CATH AND CORONARY ANGIOGRAPHY N/A 07/10/2020   Procedure: LEFT HEART CATH AND CORONARY ANGIOGRAPHY;  Surgeon: Sherren Mocha, MD;  Location: [place] [facility];  Service: Cardiovascular;  Laterality: N/A;  . LUMBAR LAMINECTOMY  2003  . LUNG BIOPSY Left 12/26/2016   Procedure: LUNG BIOPSY;  Surgeon: [name], MD;  Location: [facility];  Service: Thoracic;  Laterality:  Left;  . PACEMAKER IMPLANT N/A 03/30/2020   Procedure: PACEMAKER IMPLANT;  Surgeon: Evans Lance, MD;  Location: [place] [facility];  Service: Cardiovascular;  Laterality: N/A;  . PERIPHERAL VASCULAR INTERVENTION Right 12/10/2020   Procedure: PERIPHERAL VASCULAR INTERVENTION;  Surgeon: Cherre Robins, MD;  Location: [place] [facility];  Service: Cardiovascular;  Laterality: Right;  SFA  . POSTERIOR FUSION CERVICAL SPINE  2012  . PPM GENERATOR REMOVAL N/A 12/14/2020   Procedure: PPM GENERATOR REMOVAL;  Surgeon: Evans Lance, MD;  Location: [facility];  Service: Cardiovascular;  Laterality: N/A;  . TEE WITHOUT CARDIOVERSION N/A 12/11/2020   Procedure: TRANSESOPHAGEAL ECHOCARDIOGRAM (TEE);  Surgeon: Geralynn Rile, MD;  Location: [place];  Service: Cardiovascular;  Laterality: N/A;  . TRIGGER FINGER RELEASE  2011   4th finger left hand  . VIDEO ASSISTED THORACOSCOPY Left 12/26/2016   Procedure: VIDEO ASSISTED THORACOSCOPY;  Surgeon: [name], MD;  Location: [place] [facility];  Service: Thoracic;  Laterality: Left;  [place] VIDEO BRONCHOSCOPY N/A 12/26/2016    Procedure: VIDEO BRONCHOSCOPY;  Surgeon: [name], MD;  Location: [facility];  Service: Thoracic;  Laterality: N/A;     Social History:   reports that he has never smoked. He has never used smokeless tobacco. He reports that he does not drink alcohol and does not use drugs.   Family History:  His family history includes Diabetes Mellitus II in his mother; Emphysema (age of onset: 8) in his father; Heart attack in his father. There is no history of Colon cancer, Esophageal cancer, Rectal cancer, or Stomach cancer.   Allergies Allergies  Allergen Reactions  . Codeine Hives and Itching  . Ofev [Nintedanib] Diarrhea    SEVERE DIARRHEA  . Pirfenidone Diarrhea and Other (See Comments)    Esbriet (Pirfenidone) causes elevated LFTs. D/C on 06/14/17 and SEVERE DIARRHEA      Home Medications  Prior to Admission medications   Medication Sig Start Date End Date Taking? Authorizing Provider  albuterol (VENTOLIN HFA) 108 (90 Base) MCG/ACT inhaler Inhale 2 puffs into the lungs every 4 (four) hours as needed for wheezing or shortness of breath.   Yes [provider]  apixaban (ELIQUIS) 5 MG TABS tablet Take 1 tablet (5 mg total) by mouth 2 (two) times daily. 12/08/20  Yes Evans Lance, MD  Ascorbic Acid (VITAMIN C WITH ROSE HIPS) 500 MG tablet Take 500 mg by mouth daily.   Yes [provider]  aspirin EC 81 MG tablet Take 81 mg by mouth daily.   Yes [provider]  atorvastatin (LIPITOR) 40 MG tablet Take 20 mg by mouth in the morning and at bedtime. 11/16/20  Yes [provider]  ciprofloxacin (CIPRO) 500 MG tablet Take 1 tablet (500 mg total) by mouth 2 (two) times daily. 02/22/21 03/24/21 Yes [name], MD  Collagenase (SANTYL EX) Apply 1 application topically 2 (two) times daily.   Yes [provider]  Continuous Blood Gluc Sensor (FREESTYLE LIBRE 14 DAY SENSOR) MISC 1 patch every 14 (fourteen) days.   Yes [provider]   diclofenac (VOLTAREN) 75 MG EC tablet Take 75 mg by mouth 2 (two) times daily as needed for pain. 02/14/21  Yes [provider]  diphenhydrAMINE HCl, Sleep, (ZZZQUIL) 25 MG CAPS Take 25 mg by mouth at bedtime as needed (sleep).   Yes [provider]  Ensure Max Protein (ENSURE MAX PROTEIN) LIQD Take 330 mLs (11 oz total) by mouth  2 (two) times daily. 12/16/20  Yes Sheikh, Omair Latif, DO  escitalopram (LEXAPRO) 10 MG tablet Take 10 mg by mouth daily.   Yes [provider]  FARXIGA 10 MG TABS tablet Take 10 mg by mouth daily. 01/14/21  Yes [provider]  furosemide (LASIX) 40 MG tablet Take 40 mg by mouth daily. 02/15/18  Yes [provider]  HYDROcodone-acetaminophen (NORCO/VICODIN) 5-325 MG tablet Take 1 tablet by mouth every 4 (four) hours as needed. Patient taking differently: Take 1 tablet by mouth every 4 (four) hours as needed for moderate pain. 03/04/21  Yes [name], MD  insulin lispro (HUMALOG) 100 UNIT/ML injection Inject 0.07 mLs (7 Units total) into the skin 3 (three) times daily with meals. Patient taking differently: Inject 10 Units into the skin 3 (three) times daily with meals. 12/16/20  Yes Sheikh, Omair Latif, DO  iron polysaccharides (NIFEREX) 150 MG capsule Take 150 mg by mouth daily with lunch. 02/12/21  Yes [provider]  LORazepam (ATIVAN) 1 MG tablet Take 1 mg by mouth at bedtime. 03/20/20  Yes [provider]  losartan (COZAAR) 50 MG tablet Take 25 mg by mouth daily.   Yes [provider]  magnesium oxide (MAG-OX) 400 MG tablet TAKE 1 CAPSULE BY MOUTH ONCE DAILY Patient taking differently: Take 400 mg by mouth daily. 08/11/20  Yes Brand Males, MD  metoprolol tartrate (LOPRESSOR) 50 MG tablet Take 25 mg by mouth daily.   Yes [provider]  Multiple Vitamin (MULTIVITAMIN WITH MINERALS) TABS tablet Take 1 tablet by mouth daily.   Yes [provider]  Olopatadine HCl (PATADAY OP)  Place 1 drop into both eyes daily as needed (allergies).   Yes [provider]  ondansetron (ZOFRAN) 4 MG tablet TAKE 1 TABLET BY MOUTH EVERY 8 HOURS AS NEEDED FOR NAUSEA AND VOMITING Patient taking differently: Take 4 mg by mouth every 8 (eight) hours as needed for nausea or vomiting. 03/04/21  Yes [name], MD  pantoprazole (PROTONIX) 40 MG tablet Take 40 mg by mouth daily.   Yes [provider]  polyethylene glycol powder (GLYCOLAX/MIRALAX) 17 GM/SCOOP powder Take 17 g by mouth daily as needed for moderate constipation. 11/15/20  Yes [provider]  Probiotic Product (PROBIOTIC PO) Take 1 capsule by mouth daily.   Yes [provider]  Promethazine-Phenyleph-Codeine (PROMETHAZINE VC/CODEINE) 6.25-5-10 MG/5ML SYRP Take 5 mLs by mouth every 6 (six) hours as needed for cough. 02/16/21  Yes [provider]  zinc sulfate 220 (50 Zn) MG capsule Take 220 mg by mouth daily.   Yes [provider]     Critical care time:    Performed by: Johnsie Cancel  Total critical care time: 45 minutes  Critical care time was exclusive of separately billable procedures and treating other patients.  Critical care was necessary to treat or prevent imminent or life-threatening deterioration.  Critical care was time spent personally by me on the following activities: development of treatment plan with patient and/or surrogate as well as nursing, discussions with consultants, evaluation of patient's response to treatment, examination of patient, obtaining history from patient or surrogate, ordering and performing treatments and interventions, ordering and review of laboratory studies, ordering and review of radiographic studies, pulse oximetry and re-evaluation of patient's condition.  Johnsie Cancel, NP-C [facility] Personal contact information can be found on Amion  If no response please page: Adult pulmonary and critical care  medicine pager on Amion  unitl 7pm After 7pm please call [phone] 03/18/2021, 3:37 PM

## 2021-03-18 NOTE — Progress Notes (Signed)
Inpatient Diabetes Program Recommendations  AACE/ADA: New Consensus Statement on Inpatient Glycemic Control (2015)  Target Ranges:  Prepandial:   less than 140 mg/dL      Peak postprandial:   less than 180 mg/dL (1-2 hours)      Critically ill patients:  140 - 180 mg/dL   Lab Results  Component Value Date   GLUCAP 214 (H) 03/18/2021   HGBA1C 7.8 (H) 02/15/2021    Review of Glycemic Control Results for SHILOH, SWOPES (MRN [id number]) as of 03/18/2021 09:58  Ref. Range 03/17/2021 06:42 03/17/2021 09:28 03/17/2021 17:24 03/17/2021 20:46 03/18/2021 06:42  Glucose-Capillary Latest Ref Range: 70 - 99 mg/dL 124 (H) 141 (H) 151 (H) 180 (H) 214 (H)   Diabetes history: DM2 Outpatient Diabetes medications: Humalog 7 units tid + Farxiga 10 mg qd Current orders for Inpatient glycemic control: Novolog 0-15 units tid correction + Farxiga 10 mg  Inpatient Diabetes Program Recommendations:   Received consult regarding diabetes management while in the hospital. Will follow and patient may benefit from small amount basal if CBGs elevate.  Thank you, Nani Gasser. Lincoln Kleiner, RN, MSN, CDE  Diabetes Coordinator Inpatient Glycemic Control Team Team Pager [phone] (8am-5pm) 03/18/2021 10:03 AM

## 2021-03-18 NOTE — Progress Notes (Signed)
Patient is postop day 1 status post below-knee amputation.  He is lying comfortably in bed.  On-call was called early this morning for an H&H of 6 and 19.  2 units of blood have been ordered.  Wound VAC is functioning strawberry colored tinged fluid 200 currently in canister has had a total of 1000 since surgery   And wife would like to go to inpatient rehab.  We will continue to follow H&H

## 2021-03-18 NOTE — Progress Notes (Signed)
Wound vac cannister changed at this time. Reinforced pt's dressing again due to leaking profusely.

## 2021-03-18 NOTE — Progress Notes (Signed)
Notified Dr. Jess Barters answering service of pt's hemoglobin

## 2021-03-18 NOTE — Progress Notes (Signed)
OT Cancellation Note  Patient Details Name: Shane [name] MRN: [id number] DOB: 02-08-1941   Cancelled Treatment:    Reason Eval/Treat Not Completed: Medical issues which prohibited therapy (Low HgB and BP.) Will follow up when medically ready.   Ramond Dial, OT/L   Acute OT Clinical Specialist Acute Rehabilitation Services Pager [phone] Office [phone]  03/18/2021, 1:43 PM

## 2021-03-19 ENCOUNTER — Inpatient Hospital Stay (HOSPITAL_COMMUNITY): Payer: Medicare Other

## 2021-03-19 DIAGNOSIS — T8119XA Other postprocedural shock, initial encounter: Secondary | ICD-10-CM

## 2021-03-19 DIAGNOSIS — I4891 Unspecified atrial fibrillation: Secondary | ICD-10-CM | POA: Diagnosis not present

## 2021-03-19 DIAGNOSIS — D500 Iron deficiency anemia secondary to blood loss (chronic): Secondary | ICD-10-CM | POA: Diagnosis not present

## 2021-03-19 LAB — BASIC METABOLIC PANEL
Anion gap: 7 (ref 5–15)
BUN: 44 mg/dL — ABNORMAL HIGH (ref 8–23)
CO2: 27 mmol/L (ref 22–32)
Calcium: 8 mg/dL — ABNORMAL LOW (ref 8.9–10.3)
Chloride: 94 mmol/L — ABNORMAL LOW (ref 98–111)
Creatinine, Ser: 1.85 mg/dL — ABNORMAL HIGH (ref 0.61–1.24)
GFR, Estimated: 37 mL/min — ABNORMAL LOW (ref >60)
Glucose, Bld: 191 mg/dL — ABNORMAL HIGH (ref 70–99)
Potassium: 4.9 mmol/L (ref 3.5–5.1)
Sodium: 128 mmol/L — ABNORMAL LOW (ref 135–145)

## 2021-03-19 LAB — CBC
HCT: 25.2 % — ABNORMAL LOW (ref 39.0–52.0)
Hemoglobin: 8 g/dL — ABNORMAL LOW (ref 13.0–17.0)
MCH: 27.9 pg (ref 26.0–34.0)
MCHC: 31.7 g/dL (ref 30.0–36.0)
MCV: 87.8 fL (ref 80.0–100.0)
Platelets: 279 10*3/uL (ref 150–400)
RBC: 2.87 MIL/uL — ABNORMAL LOW (ref 4.22–5.81)
RDW: 14.6 % (ref 11.5–15.5)
WBC: 13.4 10*3/uL — ABNORMAL HIGH (ref 4.0–10.5)
nRBC: 0 % (ref 0.0–0.2)

## 2021-03-19 LAB — ECHOCARDIOGRAM COMPLETE
AR max vel: 0.64 cm2
AV Area VTI: 0.64 cm2
AV Area mean vel: 0.65 cm2
AV Mean grad: 20.5 mmHg
AV Peak grad: 36.4 mmHg
Ao pk vel: 3.02 m/s
Height: 72 in
S' Lateral: 3.5 cm
Weight: 3767.22 oz

## 2021-03-19 LAB — GLUCOSE, CAPILLARY
Glucose-Capillary: 178 mg/dL — ABNORMAL HIGH (ref 70–99)
Glucose-Capillary: 236 mg/dL — ABNORMAL HIGH (ref 70–99)
Glucose-Capillary: 192 mg/dL — ABNORMAL HIGH (ref 70–99)
Glucose-Capillary: 186 mg/dL — ABNORMAL HIGH (ref 70–99)

## 2021-03-19 LAB — SURGICAL PATHOLOGY

## 2021-03-19 MED ORDER — PERFLUTREN LIPID MICROSPHERE
1.0000 mL | INTRAVENOUS | Status: AC | PRN
  Administered 2021-03-19: 2 mL via INTRAVENOUS
  Filled 2021-03-19: qty 10

## 2021-03-19 MED ORDER — SODIUM CHLORIDE 0.9 % IV BOLUS
500.0000 mL | Freq: Once | INTRAVENOUS | Status: AC
  Administered 2021-03-19: 500 mL via INTRAVENOUS

## 2021-03-19 MED ORDER — PERFLUTREN LIPID MICROSPHERE
1.0000 mL | INTRAVENOUS | Status: AC | PRN
  Filled 2021-03-19: qty 10

## 2021-03-19 MED ORDER — GLUCERNA SHAKE PO LIQD
237.0000 mL | Freq: Three times a day (TID) | ORAL | Status: DC
  Administered 2021-03-19 – 2021-03-26 (×17): 237 mL via ORAL
  Filled 2021-03-19 (×3): qty 237

## 2021-03-19 NOTE — Progress Notes (Signed)
eLink Physician-Brief Progress Note Patient Name: Shane [name] DOB: [birth year] MRN: [id number]   Date of Service  03/19/2021  HPI/Events of Note  Patient's 12 lead EKG confirms his rhythm is at his baseline, MAP 67 mmHg.  eICU Interventions  No critical care intervention at this time, if MAP drops below 65 mmHg his Levophed infusion may need to be re-started.        [name] 03/19/2021, 9:27 PM

## 2021-03-19 NOTE — Progress Notes (Signed)
Patient ID: Shane [name], male   DOB: [birth year], 80 y.o.   MRN: [id number] Patient is postoperative day 2 right transtibial amputation.  There is about 450 cc in the wound VAC canister.  There is a good suction fit but there is some blood under the dressing.  Anticipate we will need to remove the wound VAC dressing in several days.  Patient has been stabilized in the ICU.  Plan for adjustment of his blood pressure and diabetic medications.  With possible transfer to the floor today.  Hemoglobin 8.0.  Renal function unchanged

## 2021-03-19 NOTE — Progress Notes (Signed)
  Echocardiogram 2D Echocardiogram has been performed with Definity.  Shane [name] 03/19/2021, 9:57 AM

## 2021-03-19 NOTE — Evaluation (Signed)
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 He was noted to take his eliquis the morning of the procedure and has been transferred to the ICU postoperatively for hypotension related to blood loss. He has received 2 units of PRBCs and was started on peripheral levophed. 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  Pt currently with functional limitations due to the deficits listed below (see PT Problem List). Pt will benefit from skilled PT to increase their independence and safety with mobility to allow discharge to the venue listed below.  Pt very agreeable to mobility and able to stand at EOB today.  Pt with difficulty moving up [place] and fatigued quickly.   BP also low upon sitting again however improved with supine position.  Pt eager to continue mobility and work towards being able to use prosthesis.  CIR recommended at this time.     Follow Up Recommendations CIR    Equipment Recommendations  None recommended by PT    Recommendations for Other Services Rehab consult     Precautions / Restrictions Precautions Precautions: Fall Precaution Comments: monitor BP Restrictions Weight Bearing Restrictions: Yes RLE Weight Bearing: Non weight bearing      Mobility  Bed Mobility Overal bed mobility: Needs Assistance Bed Mobility: Supine to Sit;Sit to Supine     Supine to sit: Min guard Sit to supine: Min guard   General bed mobility comments: min/guard for safety and lines    Transfers Overall transfer level: Needs assistance Equipment used: Rolling walker (2 wheeled) Transfers: Sit  to/from Stand Sit to Stand: Mod assist;+2 physical assistance         General transfer comment: assist to rise, steady and control descent; cues for hand placement and weight shifting; pt attempted to scoot up [facility] however difficulty with advancing/lifting Lt LE; fatigued quickly  Ambulation/Gait                Stairs            Wheelchair Mobility    Modified Rankin (Stroke Patients Only)       Balance Overall balance assessment: Needs assistance         Standing balance support: Bilateral upper extremity supported Standing balance-Leahy Scale: Poor Standing balance comment: reliant on UE support, requires assist for challenges                             Pertinent Vitals/Pain Pain Assessment: No/denies pain    Home Living Family/patient expects to be discharged to:: Private residence Living Arrangements: Spouse/significant other Available Help at Discharge: Family;Available PRN/intermittently Type of Home: House Home Access: Ramped entrance     Home Layout: Two level;Able to live on main level with bedroom/bathroom Home Equipment: Walker - 4 wheels;Cane - single point;Cane - quad;Shower seat;Grab bars - tub/shower;Hand held shower head;Wheelchair - Education officer, community - power      Prior Function Level of Independence: Independent with assistive device(s)         Comments: using RW and electric w/c to navigate home and community. Ind with most ADLs, states his  wife does help with washing back and buttocks (per recent admission 3 weeks ago); pt reports mostly transfers s/p R ankle I&D due to limited [place] status     Hand Dominance        Extremity/Trunk Assessment   Upper Extremity Assessment Upper Extremity Assessment: Overall WFL for tasks assessed    Lower Extremity Assessment Lower Extremity Assessment: RLE deficits/detail RLE Deficits / Details: NPWT to bulbous surgical site, issues with bleeding post op so did not MMT, AROM  knee intact and able to peform hip flexion    Cervical / Trunk Assessment Cervical / Trunk Assessment: Normal  Communication   Communication: No difficulties  Cognition Arousal/Alertness: Awake/alert Behavior During Therapy: WFL for tasks assessed/performed Overall Cognitive Status: Within Functional Limits for tasks assessed                                        General Comments General comments (skin integrity, edema, etc.): 73-115 bpm, 91%RA, Pre BP 105/58, sitting BP 106/93, post stand BP 80/73, BP once supine in bed 105/65    Exercises     Assessment/Plan    PT Assessment Patient needs continued PT services  PT Problem List Decreased mobility;Decreased strength;Decreased activity tolerance;Decreased balance;Decreased knowledge of use of DME;Cardiopulmonary status limiting activity       PT Treatment Interventions DME instruction;Gait training;Balance training;Therapeutic exercise;Functional mobility training;Therapeutic activities;Patient/family education;Wheelchair mobility training    PT Goals (Current goals can be found in the Care Plan section)  Acute Rehab PT Goals Patient Stated Goal: would eventually like to have prosthesis PT Goal Formulation: With patient Time For Goal Achievement: 04/02/21 Potential to Achieve Goals: Good    Frequency Min 4X/week   Barriers to discharge        Co-evaluation               AM-PAC PT "6 Clicks" Mobility  Outcome Measure Help needed turning from your back to your side while in a flat bed without using bedrails?: A Little Help needed moving from lying on your back to sitting on the side of a flat bed without using bedrails?: A Little Help needed moving to and from a bed to a chair (including a wheelchair)?: A Lot Help needed standing up from a chair using your arms (e.g., wheelchair or bedside chair)?: A Lot Help needed to walk in hospital room?: Total Help needed climbing 3-5 steps with a railing? :  Total 6 Click Score: 12    End of Session Equipment Utilized During Treatment: Gait belt Activity Tolerance: Patient tolerated treatment well Patient left: in bed;with call bell/phone within reach;with bed alarm set;with SCD's reapplied Nurse Communication: Mobility status PT Visit Diagnosis: Other abnormalities of gait and mobility (R26.89)    Time: 8177-1165 PT Time Calculation (min) (ACUTE ONLY): 27 min   Charges:   PT Evaluation $PT Eval Moderate Complexity: 1 Mod PT Treatments $Therapeutic Activity: 8-22 mins       Jannette Spanner PT, DPT Acute Rehabilitation Services Pager: [phone] Office: [phone]  [place] Ram E 03/19/2021, 2:15 PM

## 2021-03-19 NOTE — Evaluation (Signed)
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 He was noted to take his eliquis the morning of the procedure and has been transferred to the ICU postoperatively for hypotension related to blood loss. He has received 2 units of PRBCs and was started on peripheral levophed. 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 Pt currently requires minguard for bed mobility and modA to stand from EOB with 1 person physical assistance and +2 for safety. Pt demonstrated decreased activity tolerance, instability and weakness secondary to amputation. Educated pt on grieving process associated with amputations, phantom limb pain and sensation and desensitization strategies. Due to decline in current level of function, pt would benefit from acute OT to address established goals to facilitate safe D/C to venue listed below. At this time, recommend CIR follow-up. Will continue to follow acutely.     Follow Up Recommendations  CIR    Equipment Recommendations  3 in 1 bedside commode    Recommendations for Other Services       Precautions / Restrictions Precautions Precautions: Fall Precaution Comments: monitor BP Restrictions Weight Bearing Restrictions: Yes RLE Weight Bearing: Non weight bearing      Mobility Bed Mobility Overal bed mobility: Needs Assistance Bed Mobility: Supine to Sit;Sit to Supine     Supine to sit: Min guard Sit to supine: Min guard   General bed mobility comments: min/guard for safety and lines     Transfers Overall transfer level: Needs assistance Equipment used: Rolling walker (2 wheeled) Transfers: Sit to/from Stand Sit to Stand: Mod assist;+2 safety/equipment         General transfer comment: modA to powerup into standing, +2 assistance for safety, pt required moderate assistance in standing for stability    Balance Overall balance assessment: Needs assistance Sitting-balance support: Single extremity supported;Feet supported Sitting balance-Leahy Scale: Fair     Standing balance support: Bilateral upper extremity supported Standing balance-Leahy Scale: Poor Standing balance comment: reliant on UE support, requires assist for challenges                           ADL either performed or assessed with clinical judgement   ADL Overall ADL's : Needs assistance/impaired Eating/Feeding: Set up;Sitting   Grooming: Set up;Sitting   Upper Body Bathing: Set up;Sitting   Lower Body Bathing: Moderate assistance;Sit to/from stand;+2 for safety/equipment   Upper Body Dressing : Set up;Sitting   Lower Body Dressing: Moderate assistance;Sit to/from stand               Functional mobility during ADLs: Moderate assistance;Rolling walker;+2 for safety/equipment General ADL Comments: pt with stability standing and shifted weight, therefore limited to EOB;educated pt on desensitization strategies and importance of knee mobility     Vision         Perception     Praxis      Pertinent Vitals/Pain Pain Assessment: No/denies pain     Hand Dominance Right   Extremity/Trunk Assessment Upper Extremity Assessment Upper Extremity Assessment: Overall WFL for tasks  assessed   Lower Extremity Assessment Lower Extremity Assessment: Defer to PT evaluation RLE Deficits / Details: NPWT to bulbous surgical site, issues with bleeding post op so did not MMT, AROM knee intact and able to peform hip flexion   Cervical / Trunk Assessment Cervical / Trunk  Assessment: Normal   Communication Communication Communication: No difficulties   Cognition Arousal/Alertness: Awake/alert Behavior During Therapy: WFL for tasks assessed/performed Overall Cognitive Status: Within Functional Limits for tasks assessed                                 General Comments: educated pt on grieving process associated with amputations   General Comments  BP 108/71 standing EOB pt with reports of dizziness, reports hx of vertigo    Exercises     Shoulder Instructions      Home Living Family/patient expects to be discharged to:: Private residence Living Arrangements: Spouse/significant other Available Help at Discharge: Family;Available PRN/intermittently Type of Home: House Home Access: Ramped entrance     Home Layout: Two level;Able to live on main level with bedroom/bathroom     Bathroom Shower/Tub: Occupational psychologist: Handicapped height Bathroom Accessibility: Yes   Home Equipment: Environmental consultant - 4 wheels;Cane - single point;Cane - quad;Shower seat;Grab bars - tub/shower;Hand held shower head;Wheelchair - Education officer, community - power          Prior Functioning/Environment Level of Independence: Independent with assistive device(s)        Comments: using RW and electric w/c to navigate home and community. Ind with most ADLs, states his wife does help with washing back and buttocks (per recent admission 3 weeks ago); pt reports mostly transfers s/p R ankle I&D due to limited [place] status        OT Problem List: Decreased activity tolerance;Impaired balance (sitting and/or standing);Decreased safety awareness;Decreased knowledge of use of DME or AE      OT Treatment/Interventions: Self-care/ADL training;Therapeutic exercise;Energy conservation;DME and/or AE instruction;Therapeutic activities;Patient/family education;Balance training    OT Goals(Current goals can be found in the care plan section) Acute Rehab OT  Goals Patient Stated Goal: would eventually like to have prosthesis OT Goal Formulation: With patient Time For Goal Achievement: 04/02/21 Potential to Achieve Goals: Good ADL Goals Pt Will Perform Lower Body Dressing: sit to/from stand;with min guard assist Pt Will Transfer to Toilet: with min guard assist;stand pivot transfer Additional ADL Goal #1: Pt will demonstrate independence with desensitization strategies.  OT Frequency: Min 2X/week   Barriers to D/C:            Co-evaluation              AM-PAC OT "6 Clicks" Daily Activity     Outcome Measure Help from another person eating meals?: None Help from another person taking care of personal grooming?: A Little Help from another person toileting, which includes using toliet, bedpan, or urinal?: A Lot Help from another person bathing (including washing, rinsing, drying)?: A Lot Help from another person to put on and taking off regular upper body clothing?: A Little Help from another person to put on and taking off regular lower body clothing?: A Lot 6 Click Score: 16   End of Session Equipment Utilized During Treatment: Gait belt;Rolling walker Nurse Communication: Mobility status  Activity Tolerance: Patient tolerated treatment well Patient left: in bed;with call bell/phone within reach;with bed alarm set;with family/visitor present  OT Visit Diagnosis: Unsteadiness on feet (R26.81);Other  abnormalities of gait and mobility (R26.89)                Time: 6962-9528 OT Time Calculation (min): 22 min Charges:  OT General Charges $OT Visit: 1 Visit OT Evaluation $OT Eval Moderate Complexity: [place] OTR/L Acute Rehabilitation Services Office: [place] 03/19/2021, 3:31 PM

## 2021-03-19 NOTE — Progress Notes (Signed)
Inpatient Diabetes Program Recommendations  AACE/ADA: New Consensus Statement on Inpatient Glycemic Control (2015)  Target Ranges:  Prepandial:   less than 140 mg/dL      Peak postprandial:   less than 180 mg/dL (1-2 hours)      Critically ill patients:  140 - 180 mg/dL   Lab Results  Component Value Date   GLUCAP 178 (H) 03/19/2021   HGBA1C 7.8 (H) 02/15/2021    Review of Glycemic Control Results for Shane [name], Shane [name] (MRN [id number]) as of 03/19/2021 10:41  Ref. Range 03/18/2021 06:42 03/18/2021 12:09 03/18/2021 17:18 03/18/2021 22:04 03/19/2021 08:05  Glucose-Capillary Latest Ref Range: 70 - 99 mg/dL 214 (H) 229 (H) 251 (H) 249 (H) 178 (H)   Diabetes history: DM2 Outpatient Diabetes medications: Humalog 7 units tid + Farxiga 10 mg qd Current orders for Inpatient glycemic control: Lantus 8 units qd + Novolog 0-15 units tid correction   Inpatient Diabetes Program Recommendations:   Noted postprandial CBGs elevated. Please Consider: -Novolog 3 units tid meal coverage if eats 50%  Thank you, Bethena Roys E. Edenilson Austad, RN, MSN, CDE  Diabetes Coordinator Inpatient Glycemic Control Team Team Pager [phone] (8am-5pm) 03/19/2021 10:49 AM

## 2021-03-19 NOTE — Progress Notes (Signed)
Initial Nutrition Assessment  DOCUMENTATION CODES:   Obesity unspecified  INTERVENTION:   - Glucerna Shake po TID, each supplement provides 220 kcal and 10 grams of protein  - Continue ProSource Plus 30 ml po BID, each supplement provides 100 kcal and 15 grams of protein  - Continue MVI with minerals daily  - Provided diet education and handouts regarding a high-protein diet for wound healing  NUTRITION DIAGNOSIS:   Increased nutrient needs related to wound healing as evidenced by estimated needs.  GOAL:   Patient will meet greater than or equal to 90% of their needs  MONITOR:   PO intake,Supplement acceptance,Labs,Weight trends,Skin  REASON FOR ASSESSMENT:   Malnutrition Screening Tool    ASSESSMENT:   80 year old male who presented on 4/13 for right BKA. 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 PMH of anxiety, CKD stage III, depression, DM, GERD, HLD, HTN, CAD s/p CABG, ILD, atrial fibrillation.   4/13 - s/p right BKA, application of wound VAC 4/14 - transferred to ICU with shock requiring pressors  Spoke with pt and wife at bedside. Pt reports poor PO intake that began in December 2021 after CABG. Pt states that he still eats 3 meals daily but his portion sizes are much smaller. Pt reports that he consumes 2 protein shakes daily (Premier Protein or Glucerna High Protein) that contain 150 kcal and 30 grams of protein each. Noted pt with breakfast meal tray at bedside; pt had consumed 90% of cereal and milk and a portion of scrambled eggs.  Pt endorses weight loss. He reports a UBW of 264-265 lbs and that he last weighed this in December 2021. He states that he now weighs 230 lbs. Reviewed weight history in chart. Pt with a weight loss of 17.5 kg since 06/30/20. This is a 14.1% weight loss in 9 months which is not quite significant for timeframe. A portion of weight loss can be attributed to BKA surgery on 4/13.  Pt willing to consume  Glucerna supplements during admission. Will continue with ProSource Plus and MVI with minerals.  Medications reviewed and include: ProSource Plus BID, vitamin C 1000 mg daily, colace, SSI, lantus 8 units daily, niferex, ativan, magnesium oxide, MVI with minerals daily, protonix, zinc sulfate, levophed  Labs reviewed: sodium 128, BUN 44, creatinine 1.85, hemoglobin 8.0 CBG's: 178-251 x 24 hours  [name]: 750 ml x 24 hours VAC: 250 ml x 24 hours I/O's: +1.7 L since admit  NUTRITION - FOCUSED PHYSICAL EXAM:  Flowsheet Row Most Recent Value  Orbital Region No depletion  Upper Arm Region Mild depletion  Thoracic and Lumbar Region No depletion  Buccal Region No depletion  Temple Region No depletion  Clavicle Bone Region Mild depletion  Clavicle and Acromion Bone Region Mild depletion  Scapular Bone Region Unable to assess  Dorsal Hand Mild depletion  Patellar Region No depletion  Anterior Thigh Region Mild depletion  Posterior Calf Region No depletion  Edema (RD Assessment) Mild  Hair Reviewed  Eyes Reviewed  Mouth Reviewed  Skin Reviewed  Nails Reviewed       Diet Order:   Diet Order            Diet Carb Modified Fluid consistency: Thin; Room service appropriate? Yes  Diet effective now                 EDUCATION NEEDS:   Education needs have been addressed  Skin:  Skin Assessment: Skin Integrity Issues: Wound VAC: right  leg  Last BM:  03/17/21  Height:   Ht Readings from Last 1 Encounters:  03/18/21 6' (1.829 m)    Weight:   Wt Readings from Last 1 Encounters:  03/18/21 106.8 kg    BMI:  Body mass index is 31.93 kg/m.  Estimated Nutritional Needs:   Kcal:  2200-2400  Protein:  115-135 grams  Fluid:  >/= 2.0 L    Gustavus Bryant, MS, RD, LDN Inpatient Clinical Dietitian Please see AMiON for contact information.

## 2021-03-19 NOTE — Progress Notes (Signed)
NAME:  Shane [name], MRN:  [id number], DOB:  [birth year], LOS: 2 ADMISSION DATE:  03/17/2021, CONSULTATION DATE:  03/17/2021 REFERRING MD:  Dr.Duda , CHIEF COMPLAINT: Hypotension with suspected early onset hypovolemic shock  History of Present Illness:  Shane [name] is a 80 year old male seen with hemorrhagic shock post BKA in the setting of chronic anticoagulation use. 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 4/13 Admitted for right BKA  . 4/14 hemorrhagic shock requiring 2 units PRBC and low dose pressors with transfer to ICU  . 4/15 pressor requirement decreasing no signs of bleeding overnight    Interim History / Subjective:  No issues overnight  Wife at bedside   Objective   Blood pressure 101/66, pulse (!) 55, temperature 98.7 F (37.1 C), temperature source Oral, resp. rate (!) 26, height 6' (1.829 m), weight 106.8 kg, SpO2 100 %.        Intake/Output Summary (Last 24 hours) at 03/19/2021 0843 Last data filed at 03/19/2021 0800 Gross per 24 hour  Intake 3669.54 ml  Output 1000 ml  Net 2669.54 ml   Filed Weights   03/17/21 0645 03/18/21 1655  Weight: 108 kg 106.8 kg    Examination: General: Pleasant elderly male lying in bed in NAD  HEENT: [place]/AT, MM pink/moist, PERRL,  Neuro: Alert and oriented x3, non-focal  CV: s1s2 regular rate and rhythm, no murmur, rubs, or gallops,  PULM:  Clear to ascultation bilaterally, no added breath sounds GI: soft, bowel sounds active in all 4 quadrants, non-tender, non-distended Extremities: warm/dry,  no edema  Skin: no rashes or lesions   Labs/imaging that I havepersonally reviewed    4/14: CBC with hemoglobin drop of 3 g from 9.4-6.0.  CHEM panel NA 130, potassium 3.4, creatinine 1.67  Resolved Hospital Problem list     Assessment & Plan:  Hemorrhagic shock  -Secondary to excessive blood loss post elective BKA in the setting of chronic anticoagulation therapy -Patient utilizes Eliquis for chronic atrial fibrillation with last dose morning of surgery. 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 Chase Caller in the outpatient setting Obstructive sleep apnea compliant with CPAP P: Encourage pulmonary hygiene  CPAP at bedside at rest  Head of bed elevated  Mobilize as able  ID type 2 diabetes -Home medication includes Farxiga and short acting insulin -Hemoglobin A1C 7.8 P: CBG q4hrs SSI  Home Farxiga on hold  Lantus started 4/14  CKD stage IIIb -Baseline creatinine 1.4-1.6 woth GFR mid 40's Hyperkalemia  -Potassium 5.4 on Am labs, on chart reviewno temporizing measures done  P: Follow renal function / urine output Trend Bmet Avoid nephrotoxins Ensure adequate renal perfusion     Best practice   Diet:  Oral Pain/Anxiety/Delirium protocol (if indicated): No VAP protocol (if indicated): Not indicated DVT prophylaxis: Contraindicated GI prophylaxis: N/A Glucose control:  SSI Yes Central venous access:  N/A Arterial line:  N/A Foley:  N/A Mobility:  OOB  PT consulted: Yes Last date of multidisciplinary goals of care discussion Family update at bedside  Code Status:  full code Disposition: ICU  Critical care time:    Performed by: Johnsie Cancel  Total critical care time: 32 minutes  Critical care time was exclusive of separately billable procedures and treating other patients.  Critical care was necessary to treat or prevent imminent or life-threatening deterioration.  Critical care was time spent personally by me on the following activities: development of treatment plan with patient and/or surrogate as well as nursing, discussions with consultants, evaluation of patient's response to treatment, examination of patient, obtaining history from patient or surrogate, ordering and performing treatments and interventions, ordering and review of laboratory studies, ordering and review of radiographic studies, pulse oximetry and re-evaluation of patient's condition.  Johnsie Cancel, NP-C [facility] Personal contact information can be found on Amion  If no response please page: Adult pulmonary and critical care medicine pager on Amion unitl 7pm After 7pm please call [phone] 03/19/2021, 8:43 AM

## 2021-03-19 NOTE — Progress Notes (Signed)
Inpatient Rehab Admissions Coordinator:   Attempted to meet with pt, off floor for testing.  Note weaning levophed today.  Would not be able to start insurance authorization process until more medically stable.  Will f/u with pt on Monday.    Shann Medal, PT, DPT Admissions Coordinator [phone] 03/19/21  2:50 PM

## 2021-03-20 DIAGNOSIS — T8119XA Other postprocedural shock, initial encounter: Secondary | ICD-10-CM

## 2021-03-20 HISTORY — DX: Other postprocedural shock, initial encounter: T81.19XA

## 2021-03-20 LAB — GLUCOSE, CAPILLARY
Glucose-Capillary: 137 mg/dL — ABNORMAL HIGH (ref 70–99)
Glucose-Capillary: 170 mg/dL — ABNORMAL HIGH (ref 70–99)
Glucose-Capillary: 167 mg/dL — ABNORMAL HIGH (ref 70–99)
Glucose-Capillary: 166 mg/dL — ABNORMAL HIGH (ref 70–99)
Glucose-Capillary: 177 mg/dL — ABNORMAL HIGH (ref 70–99)

## 2021-03-20 LAB — BASIC METABOLIC PANEL
Anion gap: 9 (ref 5–15)
BUN: 35 mg/dL — ABNORMAL HIGH (ref 8–23)
CO2: 27 mmol/L (ref 22–32)
Calcium: 8 mg/dL — ABNORMAL LOW (ref 8.9–10.3)
Chloride: 94 mmol/L — ABNORMAL LOW (ref 98–111)
Creatinine, Ser: 1.57 mg/dL — ABNORMAL HIGH (ref 0.61–1.24)
GFR, Estimated: 45 mL/min — ABNORMAL LOW (ref >60)
Glucose, Bld: 168 mg/dL — ABNORMAL HIGH (ref 70–99)
Potassium: 4.5 mmol/L (ref 3.5–5.1)
Sodium: 130 mmol/L — ABNORMAL LOW (ref 135–145)

## 2021-03-20 LAB — CBC
HCT: 22.5 % — ABNORMAL LOW (ref 39.0–52.0)
Hemoglobin: 7.3 g/dL — ABNORMAL LOW (ref 13.0–17.0)
MCH: 28.3 pg (ref 26.0–34.0)
MCHC: 32.4 g/dL (ref 30.0–36.0)
MCV: 87.2 fL (ref 80.0–100.0)
Platelets: 207 10*3/uL (ref 150–400)
RBC: 2.58 MIL/uL — ABNORMAL LOW (ref 4.22–5.81)
RDW: 14.9 % (ref 11.5–15.5)
WBC: 10 10*3/uL (ref 4.0–10.5)
nRBC: 0 % (ref 0.0–0.2)

## 2021-03-20 LAB — PREPARE RBC (CROSSMATCH)

## 2021-03-20 MED ORDER — SODIUM CHLORIDE 0.9% IV SOLUTION: Freq: Once | INTRAVENOUS | Status: DC

## 2021-03-20 MED ORDER — GABAPENTIN 300 MG PO CAPS
300.0000 mg | ORAL_CAPSULE | Freq: Three times a day (TID) | ORAL | Status: DC
  Administered 2021-03-20 – 2021-03-26 (×19): 300 mg via ORAL
  Filled 2021-03-20 (×19): qty 1

## 2021-03-20 MED ORDER — SODIUM CHLORIDE 0.9% IV SOLUTION: Freq: Once | INTRAVENOUS | Status: AC

## 2021-03-20 NOTE — Plan of Care (Signed)
  Problem: Education: Goal: Knowledge of General Education information will improve Description Including pain rating scale, medication(s)/side effects and non-pharmacologic comfort measures Outcome: Progressing   

## 2021-03-20 NOTE — Progress Notes (Signed)
PROGRESS NOTE    Shane [name]  YSA:[id number] DOB: 24-Feb-1941 DOA: 03/17/2021 PCP: Burnard Bunting, MD    Brief Narrative:  80 year old gentleman with history of chronic A. fib on Eliquis, obstructive sleep apnea on CPAP, idiopathic pulmonary fibrosis, type II AV block with pacemaker, type 2 diabetes on insulin, aortic stenosis, hypertension, GERD, CKD stage III AAA and anxiety underwent elective right below-knee amputation on 4/13 and apparently had blood loss, hypotension requiring transfer to ICU and treatment with blood transfusion and Levophed. Apparently patient took his Eliquis before surgery.   Assessment & Plan:   Active Problems:   Abscess of bursa of right ankle   Atherosclerosis of native arteries of extremities with gangrene, right leg (HCC)  Hemorrhagic shock secondary to excessive blood loss status post elective below-knee amputation on the right in the setting of chronic anticoagulation therapy: Hemoglobin dropped from 9.4-6 postprocedure. Received 2 units of PRBC, transient Levophed support and transferred to ICU.  Currently hemodynamically stabilizing. Hemoglobin 7.3 today, with significant blood loss, transfuse one more unit of PRBC today.  Patient consented.  Recheck tomorrow morning.  Continue to hold Eliquis.  Chronic A. fib anticoagulated on Eliquis: His heart rate is acceptable.  Holding all antihypertensives and Eliquis.  We will resume rate control medications once blood pressures better.  History of type II heart block requiring pacemaker and removal due to infection.  Stable as per cardiology.  Hypertension: Holding all antihypertensives.  His heart rate is acceptable, will resume metoprolol tomorrow morning.  Idiopathic pulmonary fibrosis: Currently on surveillance with pulmonary as outpatient.  Obstructive sleep apnea: Using CPAP at night.  Type 2 diabetes: On Farxiga and short acting insulin.  Continue.  Added Lantus.  CKD stage IIIb: Baseline  creatinine about 1.6.  Remains at about baseline.  Hyperkalemia: Improved.  Patient can be transferred out of ICU to orthopedics unit with remote telemetry.  We will continue to monitor and work with therapies to plan for discharge.   DVT prophylaxis: SCD's Start: 03/17/21 1519   Code Status: Full code Family Communication: Wife at the bedside Disposition Plan: Status is: Inpatient  Remains inpatient appropriate because:IV treatments appropriate due to intensity of illness or inability to take PO and Inpatient level of care appropriate due to severity of illness   Dispo: The patient is from: Home              Anticipated d/c is to: SNF              Patient currently is not medically stable to d/c.   Difficult to place patient No         Consultants:   PCCM  Orthopedics  Procedures:   Right BKA 4/13  Antimicrobials:   None   Subjective: Patient seen and examined.  Today he denies any complaints.  Wife at the bedside.  Has minimal pain on the operating site and not used much pain medicine.  Denies any chest pain nausea vomiting.  Blood pressures fairly stable.  Heart rate mostly less than 90.  Objective: Vitals:   03/20/21 0850 03/20/21 0900 03/20/21 0915 03/20/21 0930  BP: 96/60 (!) 118/104  102/69  Pulse: 92 82 99 (!) 110  Resp: 20 18 19 15   Temp: 98.5 F (36.9 C)   97.9 F (36.6 C)  TempSrc: Oral   Oral  SpO2: 100% 98% 100% 100%  Weight:      Height:        Intake/Output Summary (Last 24 hours) at 03/20/2021  [place] filed at 03/20/2021 0930 Gross per 24 hour  Intake 617.58 ml  Output 550 ml  Net 67.58 ml   Filed Weights   03/17/21 0645 03/18/21 1655 03/20/21 0415  Weight: 108 kg 106.8 kg 104.8 kg    Examination:  General exam: Appears calm and comfortable  Elderly gentleman.  Looks frail but not in any distress. Respiratory system: Clear to auscultation. Respiratory effort normal.  No added sounds. Cardiovascular system: S1 & S2  heard, clear.   Gastrointestinal system: Abdomen is nondistended, soft and nontender. No organomegaly or masses felt. Normal bowel sounds heard. Central nervous system: Alert and oriented. No focal neurological deficits. Extremities:  Right below-knee amputation, dressed and wound VAC in place, minimal soakage of dressing with blood.  400 mL bloody fluid in suction canister. Left lower extremity with adequate perfusion.    Data Reviewed: I have personally reviewed following labs and imaging studies  CBC: Recent Labs  Lab 03/17/21 0713 03/18/21 0347 03/18/21 1652 03/19/21 0043 03/20/21 0046  WBC 8.1 9.0 13.1* 13.4* 10.0  HGB 9.4* 6.0* 8.5* 8.0* 7.3*  HCT 30.4* 19.2* 26.1* 25.2* 22.5*  MCV 87.6 87.7 88.2 87.8 87.2  PLT 353 254 289 279 416   Basic Metabolic Panel: Recent Labs  Lab 03/17/21 0713 03/18/21 0347 03/18/21 1652 03/19/21 0043 03/20/21 0046  NA 130* 130* 130* 128* 130*  K 4.4 5.4* 5.5* 4.9 4.5  CL 90* 94* 95* 94* 94*  CO2 27 27 26 27 27   GLUCOSE 139* 220* 250* 191* 168*  BUN 53* 42* 43* 44* 35*  CREATININE 1.81* 1.67* 1.78* 1.85* 1.57*  CALCIUM 9.0 8.1* 8.0* 8.0* 8.0*   GFR: Estimated Creatinine Clearance: 47.8 mL/min (A) (by C-G formula based on SCr of 1.57 mg/dL (H)). Liver Function Tests: No results for input(s): AST, ALT, ALKPHOS, BILITOT, PROT, ALBUMIN in the last 168 hours. No results for input(s): LIPASE, AMYLASE in the last 168 hours. No results for input(s): AMMONIA in the last 168 hours. Coagulation Profile: No results for input(s): INR, PROTIME in the last 168 hours. Cardiac Enzymes: No results for input(s): CKTOTAL, CKMB, CKMBINDEX, TROPONINI in the last 168 hours. BNP (last 3 results) No results for input(s): PROBNP in the last 8760 hours. HbA1C: No results for input(s): HGBA1C in the last 72 hours. CBG: Recent Labs  Lab 03/19/21 0805 03/19/21 1133 03/19/21 1709 03/19/21 2105 03/20/21 0730  GLUCAP 178* 236* 192* 186* 137*   Lipid  Profile: No results for input(s): CHOL, HDL, LDLCALC, TRIG, CHOLHDL, LDLDIRECT in the last 72 hours. Thyroid Function Tests: No results for input(s): TSH, T4TOTAL, FREET4, T3FREE, THYROIDAB in the last 72 hours. Anemia Panel: No results for input(s): VITAMINB12, FOLATE, FERRITIN, TIBC, IRON, RETICCTPCT in the last 72 hours. Sepsis Labs: No results for input(s): PROCALCITON, LATICACIDVEN in the last 168 hours.  Recent Results (from the past 240 hour(s))  SARS CORONAVIRUS 2 (TAT 6-24 HRS) Nasopharyngeal Nasopharyngeal Swab     Status: None   Collection Time: 03/16/21  1:32 PM   Specimen: Nasopharyngeal Swab  Result Value Ref Range Status   SARS Coronavirus 2 NEGATIVE NEGATIVE Final    Comment: (NOTE) SARS-CoV-2 target nucleic acids are NOT DETECTED.  The SARS-CoV-2 RNA is generally detectable in upper and lower respiratory specimens during the acute phase of infection. Negative results do not preclude SARS-CoV-2 infection, do not rule out co-infections with other pathogens, and should not be used as the sole basis for treatment or other patient management decisions. Negative results  must be combined with clinical observations, patient history, and epidemiological information. The expected result is Negative.  Fact Sheet for Patients: SugarRoll.be  Fact Sheet for Healthcare Providers: https://www.woods-mathews.com/  This test is not yet approved or cleared by the [place] FDA and  has been authorized for detection and/or diagnosis of SARS-CoV-2 by FDA under an Emergency Use Authorization (EUA). This EUA will remain  in effect (meaning this test can be used) for the duration of the COVID-19 declaration under Se ction 564(b)(1) of the Act, 21 U.S.C. section 360bbb-3(b)(1), unless the authorization is terminated or revoked sooner.  Performed at [facility], [place] [address] [place], [place] [address]   MRSA PCR Screening      Status: None   Collection Time: 03/18/21  5:04 PM   Specimen: Nasopharyngeal  Result Value Ref Range Status   MRSA by PCR NEGATIVE NEGATIVE Final    Comment:        The GeneXpert MRSA Assay (FDA approved for NASAL specimens only), is one component of a comprehensive MRSA colonization surveillance program. It is not intended to diagnose MRSA infection nor to guide or monitor treatment for MRSA infections. Performed at [facility], [place] [address], [place], [place] [address]          Radiology Studies: ECHOCARDIOGRAM COMPLETE  Result Date: 03/19/2021    ECHOCARDIOGRAM REPORT   Patient Name:   Shane [name] Date of Exam: 03/19/2021 Medical Rec #:  [id number]        Height:       72.0 in Accession #:    [id number]       Weight:       235.4 lb Date of Birth:  [birth year]        BSA:          2.284 m Patient Age:    50 years         BP:           101/66 mmHg Patient Gender: M                HR:           54 bpm. Exam Location:  Inpatient Procedure: 2D Echo, Cardiac Doppler, Color Doppler and Intracardiac            Opacification Agent Indications:    Atrial fibrillation  History:        Patient has prior history of Echocardiogram examinations, most                 recent 12/11/2020. CAD, Prior CABG, Aortic Valve Disease; Risk                 Factors:Sleep Apnea, Hypertension and Diabetes. S/p pacemaker                 removal. CKD. Second degree AV block.  Sonographer:    Clayton Lefort RDCS (AE) Referring Phys: [place]  1. Left ventricular ejection fraction, by estimation, is 50 to 55%. The left ventricle has low normal function. The left ventricle has no regional wall motion abnormalities. There is moderate concentric left ventricular hypertrophy. Left ventricular diastolic function could not be evaluated.  2. Right ventricular systolic function is moderately reduced. The right ventricular size is mildly enlarged. There is moderately elevated pulmonary artery systolic  pressure.  3. Left atrial size was mild to moderately dilated.  4. Right atrial size was moderately dilated.  5. The mitral valve  is normal in structure. Mild mitral valve regurgitation. Moderate mitral annular calcification.  6. The aortic valve is calcified. There is moderate calcification of the aortic valve. There is moderate thickening of the aortic valve. Aortic valve regurgitation is not visualized. Mild to moderate aortic valve stenosis. Aortic valve mean gradient measures 20.5 mmHg.  7. Aortic dilatation noted. There is mild dilatation of the ascending aorta, measuring 41 mm.  8. The inferior vena cava is dilated in size with <50% respiratory variability, suggesting right atrial pressure of 15 mmHg. Comparison(s): No significant change from prior study. Compared to prior TEE 12/2020. FINDINGS  Left Ventricle: Left ventricular ejection fraction, by estimation, is 50 to 55%. The left ventricle has low normal function. The left ventricle has no regional wall motion abnormalities. Definity contrast agent was given IV to delineate the left ventricular endocardial borders. The left ventricular internal cavity size was normal in size. There is moderate concentric left ventricular hypertrophy. Abnormal (paradoxical) septal motion consistent with post-operative status. Left ventricular diastolic function could not be evaluated due to atrial fibrillation. Left ventricular diastolic function could not be evaluated. Right Ventricle: The right ventricular size is mildly enlarged. Right vetricular wall thickness was not well visualized. Right ventricular systolic function is moderately reduced. There is moderately elevated pulmonary artery systolic pressure. The tricuspid regurgitant velocity is 3.18 m/s, and with an assumed right atrial pressure of 15 mmHg, the estimated right ventricular systolic pressure is 97.6 mmHg. Left Atrium: Left atrial size was mild to moderately dilated. Right Atrium: Right atrial size was  moderately dilated. Pericardium: There is no evidence of pericardial effusion. Mitral Valve: The mitral valve is normal in structure. Moderate mitral annular calcification. Mild mitral valve regurgitation. Tricuspid Valve: The tricuspid valve is normal in structure. Tricuspid valve regurgitation is trivial. No evidence of tricuspid stenosis. Aortic Valve: The aortic valve is calcified. There is moderate calcification of the aortic valve. There is moderate thickening of the aortic valve. Aortic valve regurgitation is not visualized. Mild to moderate aortic stenosis is present. Aortic valve mean gradient measures 20.5 mmHg. Aortic valve peak gradient measures 36.4 mmHg. Aortic valve area, by VTI measures 0.64 cm. Pulmonic Valve: The pulmonic valve was not well visualized. Pulmonic valve regurgitation is not visualized. Aorta: Aortic dilatation noted. There is mild dilatation of the ascending aorta, measuring 41 mm. Venous: The inferior vena cava is dilated in size with less than 50% respiratory variability, suggesting right atrial pressure of 15 mmHg. IAS/Shunts: The interatrial septum was not well visualized.  LEFT VENTRICLE PLAX 2D LVIDd:         5.10 cm LVIDs:         3.50 cm LV PW:         1.40 cm LV IVS:        1.40 cm LVOT diam:     2.00 cm LV SV:         43 LV SV Index:   19 LVOT Area:     3.14 cm  RIGHT VENTRICLE            IVC RV Basal diam:  3.70 cm    IVC diam: 2.30 cm RV S prime:     7.81 cm/s TAPSE (M-mode): 1.2 cm LEFT ATRIUM             Index       RIGHT ATRIUM           Index LA diam:        4.50 cm 1.97  cm/m  RA Area:     26.70 cm LA Vol (A2C):   52.4 ml 22.95 ml/m RA Volume:   87.00 ml  38.10 ml/m LA Vol (A4C):   72.1 ml 31.57 ml/m LA Biplane Vol: 66.3 ml 29.03 ml/m  AORTIC VALVE AV Area (Vmax):    0.64 cm AV Area (Vmean):   0.65 cm AV Area (VTI):     0.64 cm AV Vmax:           301.50 cm/s AV Vmean:          210.750 cm/s AV VTI:            0.675 m AV Peak Grad:      36.4 mmHg AV Mean Grad:       20.5 mmHg LVOT Vmax:         61.12 cm/s LVOT Vmean:        43.700 cm/s LVOT VTI:          0.138 m LVOT/AV VTI ratio: 0.20  AORTA Ao Root diam: 3.85 cm Ao Asc diam:  4.10 cm TRICUSPID VALVE TR Peak grad:   40.4 mmHg TR Vmax:        318.00 cm/s  SHUNTS Systemic VTI:  0.14 m Systemic Diam: 2.00 cm Buford Dresser MD Electronically signed by Buford Dresser MD Signature Date/Time: 03/19/2021/12:33:49 PM    Final         Scheduled Meds: . (feeding supplement) PROSource Plus  30 mL Oral BID BM  . vitamin C  1,000 mg Oral Daily  . atorvastatin  20 mg Oral BID  . Chlorhexidine Gluconate Cloth  6 each Topical Daily  . docusate sodium  100 mg Oral Daily  . escitalopram  10 mg Oral Daily  . feeding supplement (GLUCERNA SHAKE)  237 mL Oral TID BM  . gabapentin  300 mg Oral TID  . insulin aspart  0-15 Units Subcutaneous TID WC  . insulin glargine  8 Units Subcutaneous QHS  . iron polysaccharides  150 mg Oral Q lunch  . LORazepam  1 mg Oral QHS  . magnesium oxide  400 mg Oral Daily  . multivitamin with minerals  1 tablet Oral Daily  . pantoprazole  40 mg Oral Daily  . zinc sulfate  220 mg Oral Daily   Continuous Infusions: . sodium chloride       LOS: 3 days    Time spent: 35 minutes    Barb Merino, MD [facility] Pager [phone]

## 2021-03-20 NOTE — Progress Notes (Signed)
Pt is having shooting pain 8/10 in recent amputation site (R AKA). Pain meds given. Pt is also having moderate amounts of bloody drainage from amputation site under wound vac dressing. Orthopedic Physician at bedside, aware, and says they will be changing the dressing on Monday. He is also aware that pain is having new onset pain of 8/10.

## 2021-03-20 NOTE — Progress Notes (Signed)
Patient ID: Shane [name], male   DOB: March 10, 1941, 80 y.o.   MRN: [id number] Patient is status post right transtibial amputation.  Electrolytes are stable renal function is improving.  Hemoglobin 7.3 and patient receiving 1 unit of packed red blood cells this morning.  White cell count has trended down currently 10.  Anticipate patient will need discharge to skilled nursing.  There is 325 cc in the wound VAC canister.

## 2021-03-21 LAB — BPAM RBC
Blood Product Expiration Date: 202205142359
Blood Product Expiration Date: 202205142359
Blood Product Expiration Date: 202205172359
ISSUE DATE / TIME: 202204141141
ISSUE DATE / TIME: 202204141317
ISSUE DATE / TIME: 202204160901
Unit Type and Rh: 5100
Unit Type and Rh: 5100
Unit Type and Rh: 5100

## 2021-03-21 LAB — BASIC METABOLIC PANEL
Anion gap: 7 (ref 5–15)
BUN: 31 mg/dL — ABNORMAL HIGH (ref 8–23)
CO2: 28 mmol/L (ref 22–32)
Calcium: 8 mg/dL — ABNORMAL LOW (ref 8.9–10.3)
Chloride: 94 mmol/L — ABNORMAL LOW (ref 98–111)
Creatinine, Ser: 1.35 mg/dL — ABNORMAL HIGH (ref 0.61–1.24)
GFR, Estimated: 53 mL/min — ABNORMAL LOW (ref >60)
Glucose, Bld: 152 mg/dL — ABNORMAL HIGH (ref 70–99)
Potassium: 4.5 mmol/L (ref 3.5–5.1)
Sodium: 129 mmol/L — ABNORMAL LOW (ref 135–145)

## 2021-03-21 LAB — TYPE AND SCREEN
ABO/RH(D): O POS
Antibody Screen: NEGATIVE
Unit division: 0
Unit division: 0
Unit division: 0

## 2021-03-21 LAB — GLUCOSE, CAPILLARY
Glucose-Capillary: 123 mg/dL — ABNORMAL HIGH (ref 70–99)
Glucose-Capillary: 186 mg/dL — ABNORMAL HIGH (ref 70–99)
Glucose-Capillary: 150 mg/dL — ABNORMAL HIGH (ref 70–99)
Glucose-Capillary: 193 mg/dL — ABNORMAL HIGH (ref 70–99)

## 2021-03-21 LAB — CBC WITH DIFFERENTIAL/PLATELET
Abs Immature Granulocytes: 0.04 10*3/uL (ref 0.00–0.07)
Basophils Absolute: 0 10*3/uL (ref 0.0–0.1)
Basophils Relative: 0 %
Eosinophils Absolute: 1.1 10*3/uL — ABNORMAL HIGH (ref 0.0–0.5)
Eosinophils Relative: 11 %
HCT: 26.5 % — ABNORMAL LOW (ref 39.0–52.0)
Hemoglobin: 8.5 g/dL — ABNORMAL LOW (ref 13.0–17.0)
Immature Granulocytes: 0 %
Lymphocytes Relative: 13 %
Lymphs Abs: 1.3 10*3/uL (ref 0.7–4.0)
MCH: 28.6 pg (ref 26.0–34.0)
MCHC: 32.1 g/dL (ref 30.0–36.0)
MCV: 89.2 fL (ref 80.0–100.0)
Monocytes Absolute: 1.5 10*3/uL — ABNORMAL HIGH (ref 0.1–1.0)
Monocytes Relative: 15 %
Neutro Abs: 6.1 10*3/uL (ref 1.7–7.7)
Neutrophils Relative %: 61 %
Platelets: 213 10*3/uL (ref 150–400)
RBC: 2.97 MIL/uL — ABNORMAL LOW (ref 4.22–5.81)
RDW: 14.6 % (ref 11.5–15.5)
WBC: 10 10*3/uL (ref 4.0–10.5)
nRBC: 0.2 % (ref 0.0–0.2)

## 2021-03-21 LAB — MAGNESIUM: Magnesium: 2 mg/dL (ref 1.7–2.4)

## 2021-03-21 MED ORDER — METOPROLOL TARTRATE 25 MG PO TABS
25.0000 mg | ORAL_TABLET | Freq: Every day | ORAL | Status: DC
  Administered 2021-03-21: 25 mg via ORAL
  Filled 2021-03-21 (×2): qty 1

## 2021-03-21 NOTE — Progress Notes (Signed)
PROGRESS NOTE    Shane [name]  ZWC:[id number] DOB: September 11, 1941 DOA: 03/17/2021 PCP: Burnard Bunting, MD    Brief Narrative:  80 year old gentleman with history of chronic A. fib on Eliquis, obstructive sleep apnea on CPAP, idiopathic pulmonary fibrosis, type II AV block with pacemaker, type 2 diabetes on insulin, aortic stenosis, hypertension, GERD, CKD stage III AAA and anxiety underwent elective right below-knee amputation on 4/13 and apparently had blood loss, hypotension requiring transfer to ICU and treatment with blood transfusion and Levophed. Apparently patient took his Eliquis before surgery.   Assessment & Plan:   Active Problems:   Abscess of bursa of right ankle   Atherosclerosis of native arteries of extremities with gangrene, right leg (HCC)   Postoperative hemorrhagic shock  Hemorrhagic shock secondary to excessive blood loss status post elective below-knee amputation in the setting of chronic anticoagulation therapy: Also exacerbated by use of antihypertensives. Hemoglobin dropped from 9.4-6 postprocedure. Total 3 units of PRBC transfusion.  Stabilized.  Transiently on Levophed.  Chronic A. fib anticoagulated on Eliquis: His heart rate is acceptable.  Holding all antihypertensives and Eliquis.  Will resume beta-blockers today.  History of type II heart block requiring pacemaker and removal due to infection.  Stable as per cardiology.  Hypertension: Holding all antihypertensives.  His heart rate is acceptable, will resume metoprolol tomorrow morning.  Idiopathic pulmonary fibrosis: Currently on surveillance with pulmonary as outpatient.  Obstructive sleep apnea: Using CPAP at night.  Type 2 diabetes: On Farxiga and short acting insulin.  Continue.  Added Lantus.  CKD stage IIIb: Baseline creatinine about 1.6.  Remains at about baseline.  Hyperkalemia: Improved.   DVT prophylaxis: SCD's Start: 03/17/21 1519   Code Status: Full code Family Communication:  Patient's friend at the bedside. Disposition Plan: Status is: Inpatient  Remains inpatient appropriate because:IV treatments appropriate due to intensity of illness or inability to take PO and Inpatient level of care appropriate due to severity of illness   Dispo: The patient is from: Home              Anticipated d/c is to: SNF vs CIR.              Patient currently is medically stable.   Difficult to place patient No         Consultants:   PCCM  Orthopedics  Procedures:   Right BKA 4/13  Antimicrobials:   None   Subjective: Patient is seen and examined.  No overnight events.  Today he was very comfortable.  His pain was at the bedside.  Denies any pain today.  Telemetry shows occasional heart rate more than 100.   Objective: Vitals:   03/20/21 1453 03/20/21 1657 03/20/21 2021 03/21/21 0400  BP: 93/62 99/62 (!) 94/55 (!) 102/55  Pulse: 75 72 91 64  Resp: 19 15 20 17   Temp: 97.8 F (36.6 C) 97.7 F (36.5 C) 97.7 F (36.5 C) 98 F (36.7 C)  TempSrc: Oral Axillary Oral Axillary  SpO2: 97% 100% 96% 100%  Weight:    113.1 kg  Height:        Intake/Output Summary (Last 24 hours) at 03/21/2021 1125 Last data filed at 03/21/2021 0416 Gross per 24 hour  Intake 1191.68 ml  Output 550 ml  Net 641.68 ml   Filed Weights   03/18/21 1655 03/20/21 0415 03/21/21 0400  Weight: 106.8 kg 104.8 kg 113.1 kg    Examination:  General: Patient appears comfortable today.  Not in distress. Cardiovascular: S1-S2 normal.  Tachycardic. Respiratory: Bilateral clear. Gastrointestinal: Soft and nontender.  Bowel sounds present. Ext: Right BKA stump on compression dressing, minimally soaked with old blood. Wound VAC canister with about 400 mL thin pinkish liquid. Neuro: No focal deficit.   Data Reviewed: I have personally reviewed following labs and imaging studies  CBC: Recent Labs  Lab 03/18/21 0347 03/18/21 1652 03/19/21 0043 03/20/21 0046 03/21/21 0312  WBC 9.0  13.1* 13.4* 10.0 10.0  NEUTROABS  --   --   --   --  6.1  HGB 6.0* 8.5* 8.0* 7.3* 8.5*  HCT 19.2* 26.1* 25.2* 22.5* 26.5*  MCV 87.7 88.2 87.8 87.2 89.2  PLT 254 289 279 207 295   Basic Metabolic Panel: Recent Labs  Lab 03/18/21 0347 03/18/21 1652 03/19/21 0043 03/20/21 0046 03/21/21 0312  NA 130* 130* 128* 130* 129*  K 5.4* 5.5* 4.9 4.5 4.5  CL 94* 95* 94* 94* 94*  CO2 27 26 27 27 28   GLUCOSE 220* 250* 191* 168* 152*  BUN 42* 43* 44* 35* 31*  CREATININE 1.67* 1.78* 1.85* 1.57* 1.35*  CALCIUM 8.1* 8.0* 8.0* 8.0* 8.0*  MG  --   --   --   --  2.0   GFR: Estimated Creatinine Clearance: 57.6 mL/min (A) (by C-G formula based on SCr of 1.35 mg/dL (H)). Liver Function Tests: No results for input(s): AST, ALT, ALKPHOS, BILITOT, PROT, ALBUMIN in the last 168 hours. No results for input(s): LIPASE, AMYLASE in the last 168 hours. No results for input(s): AMMONIA in the last 168 hours. Coagulation Profile: No results for input(s): INR, PROTIME in the last 168 hours. Cardiac Enzymes: No results for input(s): CKTOTAL, CKMB, CKMBINDEX, TROPONINI in the last 168 hours. BNP (last 3 results) No results for input(s): PROBNP in the last 8760 hours. HbA1C: No results for input(s): HGBA1C in the last 72 hours. CBG: Recent Labs  Lab 03/20/21 1104 03/20/21 1703 03/20/21 2020 03/20/21 2126 03/21/21 0806  GLUCAP 170* 167* 166* 177* 123*   Lipid Profile: No results for input(s): CHOL, HDL, LDLCALC, TRIG, CHOLHDL, LDLDIRECT in the last 72 hours. Thyroid Function Tests: No results for input(s): TSH, T4TOTAL, FREET4, T3FREE, THYROIDAB in the last 72 hours. Anemia Panel: No results for input(s): VITAMINB12, FOLATE, FERRITIN, TIBC, IRON, RETICCTPCT in the last 72 hours. Sepsis Labs: No results for input(s): PROCALCITON, LATICACIDVEN in the last 168 hours.  Recent Results (from the past 240 hour(s))  SARS CORONAVIRUS 2 (TAT 6-24 HRS) Nasopharyngeal Nasopharyngeal Swab     Status: None    Collection Time: 03/16/21  1:32 PM   Specimen: Nasopharyngeal Swab  Result Value Ref Range Status   SARS Coronavirus 2 NEGATIVE NEGATIVE Final    Comment: (NOTE) SARS-CoV-2 target nucleic acids are NOT DETECTED.  The SARS-CoV-2 RNA is generally detectable in upper and lower respiratory specimens during the acute phase of infection. Negative results do not preclude SARS-CoV-2 infection, do not rule out co-infections with other pathogens, and should not be used as the sole basis for treatment or other patient management decisions. Negative results must be combined with clinical observations, patient history, and epidemiological information. The expected result is Negative.  Fact Sheet for Patients: SugarRoll.be  Fact Sheet for Healthcare Providers: https://www.woods-mathews.com/  This test is not yet approved or cleared by the [place] FDA and  has been authorized for detection and/or diagnosis of SARS-CoV-2 by FDA under an Emergency Use Authorization (EUA). This EUA will remain  in effect (meaning this test can be used) for the  duration of the COVID-19 declaration under Se ction 564(b)(1) of the Act, 21 U.S.C. section 360bbb-3(b)(1), unless the authorization is terminated or revoked sooner.  Performed at [facility], [place] [address] [place], [place] [address]   MRSA PCR Screening     Status: None   Collection Time: 03/18/21  5:04 PM   Specimen: Nasopharyngeal  Result Value Ref Range Status   MRSA by PCR NEGATIVE NEGATIVE Final    Comment:        The GeneXpert MRSA Assay (FDA approved for NASAL specimens only), is one component of a comprehensive MRSA colonization surveillance program. It is not intended to diagnose MRSA infection nor to guide or monitor treatment for MRSA infections. Performed at [facility], [place] [address] [place], [place] [address]          Radiology Studies: No results  found.      Scheduled Meds: . (feeding supplement) PROSource Plus  30 mL Oral BID BM  . vitamin C  1,000 mg Oral Daily  . atorvastatin  20 mg Oral BID  . Chlorhexidine Gluconate Cloth  6 each Topical Daily  . docusate sodium  100 mg Oral Daily  . escitalopram  10 mg Oral Daily  . feeding supplement (GLUCERNA SHAKE)  237 mL Oral TID BM  . gabapentin  300 mg Oral TID  . insulin aspart  0-15 Units Subcutaneous TID WC  . insulin glargine  8 Units Subcutaneous QHS  . iron polysaccharides  150 mg Oral Q lunch  . LORazepam  1 mg Oral QHS  . magnesium oxide  400 mg Oral Daily  . metoprolol tartrate  25 mg Oral Daily  . multivitamin with minerals  1 tablet Oral Daily  . pantoprazole  40 mg Oral Daily  . zinc sulfate  220 mg Oral Daily   Continuous Infusions: . sodium chloride       LOS: 4 days    Time spent: 32 minutes    Barb Merino, MD [facility] Pager [phone]

## 2021-03-21 NOTE — Plan of Care (Signed)

## 2021-03-22 ENCOUNTER — Encounter (INDEPENDENT_AMBULATORY_CARE_PROVIDER_SITE_OTHER): Payer: Medicare Other | Admitting: Ophthalmology

## 2021-03-22 LAB — GLUCOSE, CAPILLARY
Glucose-Capillary: 126 mg/dL — ABNORMAL HIGH (ref 70–99)
Glucose-Capillary: 169 mg/dL — ABNORMAL HIGH (ref 70–99)
Glucose-Capillary: 166 mg/dL — ABNORMAL HIGH (ref 70–99)
Glucose-Capillary: 185 mg/dL — ABNORMAL HIGH (ref 70–99)

## 2021-03-22 LAB — HEMOGLOBIN AND HEMATOCRIT, BLOOD
HCT: 26.5 % — ABNORMAL LOW (ref 39.0–52.0)
Hemoglobin: 8.6 g/dL — ABNORMAL LOW (ref 13.0–17.0)

## 2021-03-22 MED ORDER — METOPROLOL TARTRATE 12.5 MG HALF TABLET
12.5000 mg | ORAL_TABLET | Freq: Every day | ORAL | Status: DC
  Administered 2021-03-23 – 2021-03-26 (×4): 12.5 mg via ORAL
  Filled 2021-03-22 (×4): qty 1

## 2021-03-22 NOTE — Progress Notes (Signed)
Patient ID: Shane [name], male   DOB: [birth year], 80 y.o.   MRN: [id number] Patient is alert and oriented this morning without complaints.  Patient's renal function continues to improve with potassium of 4.5.  Hemoglobin stable at 8.5.  White cell count has decreased to 10.  Anticipate discharge to CIR versus skilled nursing facility as per therapy recommendations.  No drainage in the current wound VAC canister.

## 2021-03-22 NOTE — NC FL2 (Signed)
[place] LEVEL OF CARE SCREENING TOOL     IDENTIFICATION  Patient Name: Shane [name] Birthdate: 1941/05/11 Sex: male Admission Date (Current Location): 03/17/2021  [facility] and [place] Number:  Herbalist and Address:  [facility]. [facility], [place] [address], [place], [place] [address]      Provider Number: [id number]  Attending Physician Name and Address:  Barb Merino, MD  Relative Name and Phone Number:  [name], spouse, [phone]    Current Level of Care: Hospital Recommended Level of Care: [place] Prior Approval Number:    Date Approved/Denied:   PASRR Number: [id number]  Discharge Plan: SNF    Current Diagnoses: Patient Active Problem List   Diagnosis Date Noted  . Postoperative hemorrhagic shock 03/20/2021  . Atherosclerosis of native arteries of extremities with gangrene, right leg ([place])   . Hardware complicating wound infection ([place])   . Abscess of bursa of right ankle 02/15/2021  . 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 Peripheral vascular disease ([place]) 12/29/2020  . CHF (congestive heart failure), NYHA class II, chronic, diastolic ([place]) 65/02/5464  . History of COVID-19 12/22/2020  . SSS (sick sinus syndrome) ([place]) 12/22/2020  . Acute bacterial endocarditis   . MSSA bacteremia 12/09/2020  . Diabetic foot ulcer ([place]) 12/09/2020  . Foot drop, right foot   . Generalized weakness 12/08/2020  . Dehydration with hyponatremia 12/08/2020  . Atrial fibrillation, chronic ([place]) 12/08/2020  . Elevated troponin level not due myocardial infarction 12/08/2020  . Adrenal insufficiency ([place]) 11/14/2020  . Orthostatic hypotension 11/14/2020  . Physical deconditioning 11/14/2020  . Difficulty sleeping 11/07/2020  . Postoperative anemia due to acute blood loss 11/07/2020  . Right ventricular dysfunction 11/05/2020  . 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 Hearing loss 11/03/2020  . Cardiac device in situ 09/09/2020  . Coronary  atherosclerosis due to lipid rich plaque 09/02/2020  . COVID-19 virus infection 07/18/2020  . Coronary artery disease involving native heart without angina pectoris 07/10/2020  . Chronic kidney disease, stage 3a ([place]) 03/29/2020  . 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 ([place]) 03/16/2020  . Right epiretinal membrane 03/16/2020  . Posterior vitreous detachment of right eye 03/16/2020  . Aortic stenosis, moderate 03/12/2020  . 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 Near syncope 04/27/2018  . Hypoglycemia due to insulin 01/06/2018  . Essential hypertension 01/06/2018  . Diabetes mellitus type 2, with complication, on long term insulin pump ([place]) 01/06/2018  . Syncope and collapse 01/05/2018  . Encounter for therapeutic drug monitoring 06/29/2017  . OSA on CPAP 05/05/2017  . 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[place]) 01/05/2017  . Abnormal chest x-ray 10/12/2016  . Benign neoplasm of colon 01/14/2013    Orientation RESPIRATION BLADDER Height & Weight     Self,Time,Situation,Place  Normal Continent Weight: 249 lb 5.4 oz (113.1 kg) Height:  6' (182.9 cm)  BEHAVIORAL SYMPTOMS/MOOD NEUROLOGICAL BOWEL NUTRITION STATUS      Continent Diet (Please see DC Summary)  AMBULATORY STATUS COMMUNICATION OF NEEDS Skin   Extensive Assist Verbally PU Stage and Appropriate Care,Surgical wounds (Pressure injury on toe; closed incision on leg with prevena travel wound vac;)                       Personal Care Assistance Level of  Assistance  Bathing,Feeding,Dressing Bathing Assistance: Maximum assistance Feeding assistance: Independent Dressing Assistance: Limited assistance     Functional Limitations Info             SPECIAL CARE FACTORS FREQUENCY  PT (By licensed PT),OT  (By licensed OT)     PT Frequency: 5x/week OT Frequency: 5x/week            Contractures Contractures Info: Not present    Additional Factors Info  Code Status,Allergies,Psychotropic,Insulin Sliding Scale Code Status Info: Full Allergies Info: Codeine, Ofev (Nintedanib), Pirfenidone Psychotropic Info: Ativan Insulin Sliding Scale Info: See dc summary       Current Medications (03/22/2021):  This is the current hospital active medication list Current Facility-Administered Medications  Medication Dose Route Frequency Provider Last Rate Last Admin  . (feeding supplement) PROSource Plus liquid 30 mL  30 mL Oral BID BM Persons, Bevely Palmer, PA   30 mL at 03/19/21 1000  . 0.9 %  sodium chloride infusion  250 mL Intravenous Continuous Merlene Laughter F, NP      . acetaminophen (TYLENOL) tablet 325-650 mg  325-650 mg Oral Q6H PRN Persons, Bevely Palmer, PA      . albuterol (VENTOLIN HFA) 108 (90 Base) MCG/ACT inhaler 2 puff  2 puff Inhalation Q4H PRN Persons, Bevely Palmer, PA      . alum & mag hydroxide-simeth (MAALOX/MYLANTA) 200-200-20 MG/5ML suspension 15-30 mL  15-30 mL Oral Q2H PRN Persons, Bevely Palmer, [place]      . ascorbic acid (VITAMIN C) tablet 1,000 mg  1,000 mg Oral Daily Persons, Bevely Palmer, PA   1,000 mg at 03/22/21 0807  . atorvastatin (LIPITOR) tablet 20 mg  20 mg Oral BID Persons, Bevely Palmer, PA   20 mg at 03/22/21 0807  . bisacodyl (DULCOLAX) EC tablet 5 mg  5 mg Oral Daily PRN Persons, Bevely Palmer, [place]      . Chlorhexidine Gluconate Cloth 2 % PADS 6 each  6 each Topical Daily [name], MD   6 each at 03/20/21 1035  . docusate sodium (COLACE) capsule 100 mg  100 mg Oral Daily Persons, Bevely Palmer, PA   100 mg at 03/21/21 1033  . escitalopram (LEXAPRO) tablet 10 mg  10 mg Oral Daily Persons, Bevely Palmer, [place]   10 mg at 03/22/21 0807  . feeding supplement (GLUCERNA SHAKE) (GLUCERNA SHAKE) liquid 237 mL  237 mL Oral TID BM Freddi Starr, MD   237 mL at 03/22/21 0811  . gabapentin  (NEURONTIN) capsule 300 mg  300 mg Oral TID [name], MD   300 mg at 03/22/21 0807  . guaiFENesin-dextromethorphan (ROBITUSSIN DM) 100-10 MG/5ML syrup 15 mL  15 mL Oral Q4H PRN Persons, Bevely Palmer, PA      . hydrALAZINE (APRESOLINE) injection 5 mg  5 mg Intravenous Q20 Min PRN Persons, Bevely Palmer, [place]      . HYDROmorphone (DILAUDID) injection 0.5 mg  0.5 mg Intravenous Q4H PRN Persons, Bevely Palmer, PA      . insulin aspart (novoLOG) injection 0-15 Units  0-15 Units Subcutaneous TID WC Persons, Bevely Palmer, [place]   2 Units at 03/22/21 0810  . insulin glargine (LANTUS) injection 8 Units  8 Units Subcutaneous QHS Merlene Laughter F, NP   8 Units at 03/21/21 2142  . iron polysaccharides (NIFEREX) capsule 150 mg  150 mg Oral Q lunch Persons, Bevely Palmer, PA   150 mg at 03/21/21 1502  . labetalol (NORMODYNE) injection 10 mg  10 mg  Intravenous Q10 min PRN Persons, Bevely Palmer, [place]      . LORazepam (ATIVAN) tablet 1 mg  1 mg Oral QHS Persons, Bevely Palmer, [place]   1 mg at 03/21/21 2141  . magnesium citrate solution 1 Bottle  1 Bottle Oral Once PRN Persons, Bevely Palmer, PA      . magnesium oxide (MAG-OX) tablet 400 mg  400 mg Oral Daily [name], MD   400 mg at 03/22/21 0807  . [START ON 03/23/2021] metoprolol tartrate (LOPRESSOR) tablet 12.5 mg  12.5 mg Oral Daily Ghimire, Dante Gang, MD      . multivitamin with minerals tablet 1 tablet  1 tablet Oral Daily Persons, Bevely Palmer, [place]   1 tablet at 03/22/21 0807  . ondansetron (ZOFRAN) injection 4 mg  4 mg Intravenous Q6H PRN Persons, Bevely Palmer, PA   4 mg at 03/18/21 1305  . oxyCODONE (Oxy IR/ROXICODONE) immediate release tablet 5-10 mg  5-10 mg Oral Q4H PRN Persons, Bevely Palmer, PA   5 mg at 03/20/21 1033  . pantoprazole (PROTONIX) EC tablet 40 mg  40 mg Oral Daily Persons, Bevely Palmer, [place]   40 mg at 03/22/21 0807  . phenol (CHLORASEPTIC) mouth spray 1 spray  1 spray Mouth/Throat PRN Persons, Bevely Palmer, PA      . polyethylene glycol (MIRALAX / GLYCOLAX) packet 17 g  17 g Oral Daily  PRN Persons, Bevely Palmer, PA      . potassium chloride SA (KLOR-CON) CR tablet 20-40 mEq  20-40 mEq Oral Daily PRN Persons, Bevely Palmer, PA      . zinc sulfate capsule 220 mg  220 mg Oral Daily Persons, Bevely Palmer, [place]   220 mg at 03/22/21 4462     Discharge Medications: Please see discharge summary for a list of discharge medications.  Relevant Imaging Results:  Relevant Lab Results:   Additional Information SSN: [id number]. [place] COVID-19 Vaccine 09/27/2020 , 01/18/2020 , 12/28/2019  Benard Halsted, LCSW

## 2021-03-22 NOTE — Progress Notes (Signed)
Inpatient Rehab Admissions Coordinator:   Consult received.  I met with pt and his daughter, Lattie Haw, at the bedside to discuss CIR goals/expectations.  Pt and his family are open to CIR admission and pt reports that between family and hired caregivers he is never alone at home.  Has been using power mobility since January, mod I level, with some assist for ADLs, and no falls.  I do feel that, given his post-op complications and need for ICU level care, he is an excellent candidate for CIR.  Will need insurance authorization for CIR, and I explained this process to he and his daughter.  I will start insurance auth today.    Shann Medal, PT, DPT Admissions Coordinator [phone] 03/22/21  10:38 AM

## 2021-03-22 NOTE — Progress Notes (Signed)
Physical Therapy Treatment Patient Details Name: Shane [name] MRN: [id number] DOB: 03/05/1941 Today's Date: 03/22/2021    History of Present Illness 80 year old male admitted on 4/13 for BKA due to non-healing wound. He was noted to take his eliquis the morning of the procedure and has been transferred to the ICU postoperatively for hypotension related to blood loss. He has received 2 units of PRBCs and was started on peripheral levophed. PMHx: idiopathic pulmonary fibrosis, CKD III, atrial fibrillation, OSA on CPAP, CAD, DM II, and type II AV block with pacemaker placement 03/2020 s/p removal due to infection, recently had I&D of Rt ankle on 02/19/21    PT Comments    Continuing work on functional mobility and activity tolerance;  Highly engaged and motivated to be independent and to prepare for his prosthesis; Session focused on transfer training, including assessment of activity tolerance, taking serial BPs throughout; My concern is that we were unable to obtain a standing BP at 3 minutes (via the monitor), and it was coupled with feelings of lightheadedness -- I posit his BP at that time was quite low; Still, we were able to continue work on sit to stand safely, keeping the bed or chair behind him, and pt with good self-monitor for activity tolerance, and requested to sit when he needed to; We discussed pahntom sensation and desensitization, as well as the importance of stretshing hamstrings and hip flexors, and strengthening knee and hip extensors; will be an excellent candidate for CIR    Follow Up Recommendations  CIR     Equipment Recommendations  None recommended by PT    Recommendations for Other Services Rehab consult     Precautions / Restrictions Precautions Precautions: Fall Precaution Comments: monitor BP Restrictions RLE Weight Bearing: Non weight bearing    Mobility  Bed Mobility Overal bed mobility: Needs Assistance Bed Mobility: Supine to Sit     Supine to  sit: Min guard     General bed mobility comments: min/guard for safety and lines    Transfers Overall transfer level: Needs assistance Equipment used: Rolling walker (2 wheeled) Transfers: Sit to/from [facility] Sit to Stand: Mod assist;+2 safety/equipment Stand pivot transfers: Mod assist;+2 physical assistance;+2 safety/equipment       General transfer comment: Cues for hand placement and safety; seemed to shift weight ot R with initial attempt (like he was trying to pust R foot on the floor), so sat back down and regrouped; better with second and third bouts of sit to stand; performed Heel-toe pivot on LLE to pivot to recliner placed on pt's L side  Ambulation/Gait             General Gait Details: Not quite ready --but soon   Stairs             Wheelchair Mobility    Modified Rankin (Stroke Patients Only)       Balance     Sitting balance-Leahy Scale: Fair       Standing balance-Leahy Scale: Poor                              Cognition Arousal/Alertness: Awake/alert Behavior During Therapy: WFL for tasks assessed/performed Overall Cognitive Status: Within Functional Limits for tasks assessed  Exercises Total Joint Exercises Quad Sets: Right;10 reps;AROM Hip ABduction/ADduction: AROM;Both;10 reps;Other (comment) (isometric with belt around knees) Straight Leg Raises: AROM;Right;10 reps Bridges: AROM;Right;15 reps;Other (comment) (with bolsters under R thigh) Amputee Exercises Knee Flexion: AROM;Right;5 reps Knee Extension: AROM;Right;5 reps Chair Push Up: AROM;Both;10 reps Other Exercises Other Exercises: seated R hmstrings stretch x3    General Comments General comments (skin integrity, edema, etc.):   03/22/21 1143 03/22/21 1225  Vital Signs  Patient Position (if appropriate) Orthostatic Vitals  --   Orthostatic Lying   BP- Lying 111/61  --   Pulse-  Lying 59 (Map 77)  --   Orthostatic Sitting  BP- Sitting 117/81 (map 93) 120/68  Pulse- Sitting 66  (map 79)  Orthostatic Standing at 0 minutes  BP- Standing at 0 minutes 92/62  --   Pulse- Standing at 0 minutes 81 (Map 72)  --   Orthostatic Standing at 3 minutes  BP- Standing at 3 minutes  (Unable to obtain reading) symptomatic for lightheadedness   --         Pertinent Vitals/Pain Pain Assessment: Faces Faces Pain Scale: Hurts a little bit Pain Location: R residual limb Pain Descriptors / Indicators: Aching Pain Intervention(s): Monitored during session    Home Living                      Prior Function            PT Goals (current goals can now be found in the care plan section) Acute Rehab PT Goals Patient Stated Goal: would eventually like to have prosthesis PT Goal Formulation: With patient Time For Goal Achievement: 04/02/21 Potential to Achieve Goals: Good Progress towards PT goals: Progressing toward goals    Frequency    Min 4X/week      PT Plan Current plan remains appropriate    Co-evaluation              AM-PAC PT "6 Clicks" Mobility   Outcome Measure  Help needed turning from your back to your side while in a flat bed without using bedrails?: A Little Help needed moving from lying on your back to sitting on the side of a flat bed without using bedrails?: A Little Help needed moving to and from a bed to a chair (including a wheelchair)?: A Lot Help needed standing up from a chair using your arms (e.g., wheelchair or bedside chair)?: A Lot Help needed to walk in hospital room?: Total   6 Click Score: 11    End of Session Equipment Utilized During Treatment: Gait belt Activity Tolerance: Patient tolerated treatment well Patient left: in chair;with call bell/phone within reach;with family/visitor present Nurse Communication: Mobility status PT Visit Diagnosis: Other abnormalities of gait and mobility (R26.89)     Time:  6767-2094 PT Time Calculation (min) (ACUTE ONLY): 51 min  Charges:  $Therapeutic Exercise: 8-22 mins $Therapeutic Activity: 23-37 mins                     Shane [name], PT  Acute Rehabilitation Services Pager [phone] Office [phone]    Shane [name] 03/22/2021, 1:52 PM

## 2021-03-22 NOTE — Progress Notes (Signed)
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 He was noted to take his eliquis the morning of the procedure and has been transferred to the ICU postoperatively for hypotension related to blood loss. He has received 2 units of PRBCs and was started on peripheral levophed. 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 Pt sliding L foot towards HOB with RW. Pt was up in recliner earlier and only + 1 assist available help at this time. Pt with poor balance, but steady using RW; assist require for balance challenges in standing.. Pt limited by decreased strength, decreased activity tolerance and increased pain in RLE. Pt standing x3 times with RW and x1 min each. each time, pt was progressively stronger and more stabilized with RW.  Pt very motivated and wants to return home safely after rehab. Pt would greatly benefit from continued OT skilled services. OT following acutely. HR 70s-90s with exertion; O2 >90% on RA.    Follow Up Recommendations  CIR    Equipment Recommendations  3 in 1 bedside commode    Recommendations for Other Services      Precautions / Restrictions Precautions Precautions: Fall Precaution Comments: monitor BP Restrictions Weight Bearing Restrictions: Yes RLE Weight Bearing: Non weight bearing       Mobility Bed Mobility Overal bed mobility: Needs Assistance Bed Mobility: Supine to Sit;Sit to Supine     Supine to sit: Min guard Sit to supine: Min guard   General bed mobility comments: min/guard for safety and lines    Transfers Overall transfer level: Needs  assistance Equipment used: Rolling walker (2 wheeled) Transfers: Sit to/from [facility] Sit to Stand: Min assist;From elevated surface Stand pivot transfers: Mod assist;+2 physical assistance;+2 safety/equipment       General transfer comment: Pt sliding L foot towards HOB: Pt stating "I feel more confident now after doing this" Pt standing with RW x3 times x1 min each.    Balance Overall balance assessment: Needs assistance Sitting-balance support: Feet supported;Single extremity supported Sitting balance-Leahy Scale: Good     Standing balance support: Bilateral upper extremity supported Standing balance-Leahy Scale: Poor Standing balance comment: reliant on UE support, requires assist for challenges                           ADL either performed or assessed with clinical judgement   ADL Overall ADL's : Needs assistance/impaired Eating/Feeding: Set up;Sitting                       Toilet Transfer: Minimal assistance;RW Toilet Transfer Details (indicate cue type and reason): Pt sliding L foot towards HOB with RW. Pt was up in recliner earlier and only + 1 assist help at this time. Pt with poor balance, but steady.         Functional mobility during ADLs: Moderate assistance;Rolling walker;Minimal assistance (ModA for complete transfer, minA for sit to stand and sliding L foot towards HPB) General ADL Comments: Pt limited by decreased strength, decreased activity tolerance and increased pain in RLE. Pt standing x3 times with  RW and x1 min each. each time, pt was progressivly stronger and more stabilized with RW.     Vision   Vision Assessment?: No apparent visual deficits   Perception     Praxis      Cognition Arousal/Alertness: Awake/alert Behavior During Therapy: WFL for tasks assessed/performed Overall Cognitive Status: Within Functional Limits for tasks assessed                                           Exercises Total Joint Exercises Quad Sets: Right;10 reps;AROM Hip ABduction/ADduction: AROM;Both;10 reps;Other (comment) (isometric with belt around knees) Straight Leg Raises: AROM;Right;10 reps Bridges: AROM;Right;15 reps;Other (comment) (with bolsters under R thigh) Amputee Exercises Knee Flexion: AROM;Right;5 reps Knee Extension: AROM;Right;5 reps Chair Push Up: AROM;Both;10 reps Other Exercises Other Exercises: knee extension x5 reps   Shoulder Instructions       General Comments VSS on RA.    Pertinent Vitals/ Pain       Pain Assessment: Faces Faces Pain Scale: Hurts a little bit Pain Location: R residual limb Pain Descriptors / Indicators: Aching;Discomfort Pain Intervention(s): Monitored during session  Home Living Family/patient expects to be discharged to:: Private residence Living Arrangements: Spouse/significant other Available Help at Discharge: Family;Available PRN/intermittently Type of Home: House Home Access: Ramped entrance     Home Layout: Two level;Able to live on main level with bedroom/bathroom     Bathroom Shower/Tub: Occupational psychologist: Handicapped height Bathroom Accessibility: Yes              Prior Functioning/Environment              Frequency  Min 2X/week        Progress Toward Goals  OT Goals(current goals can now be found in the care plan section)  Progress towards OT goals: Progressing toward goals  Acute Rehab OT Goals Patient Stated Goal: would eventually like to have prosthesis OT Goal Formulation: With patient Time For Goal Achievement: 04/02/21 Potential to Achieve Goals: Good ADL Goals Pt Will Perform Lower Body Dressing: sit to/from stand;with min guard assist Pt Will Transfer to Toilet: with min guard assist;stand pivot transfer Additional ADL Goal #1: Pt will demonstrate independence with desensitization strategies.  Plan Discharge plan remains appropriate    Co-evaluation                  AM-PAC OT "6 Clicks" Daily Activity     Outcome Measure   Help from another person eating meals?: None Help from another person taking care of personal grooming?: A Little Help from another person toileting, which includes using toliet, bedpan, or urinal?: A Lot Help from another person bathing (including washing, rinsing, drying)?: A Lot Help from another person to put on and taking off regular upper body clothing?: A Little Help from another person to put on and taking off regular lower body clothing?: A Lot 6 Click Score: 16    End of Session Equipment Utilized During Treatment: Gait belt;Rolling walker  OT Visit Diagnosis: Unsteadiness on feet (R26.81);Pain   Activity Tolerance Patient limited by pain   Patient Left in bed;with call bell/phone within reach;with bed alarm set;with family/visitor present   Nurse Communication Mobility status        Time: 1530-1600 OT Time Calculation (min): 30 min  Charges: OT General Charges $OT Visit: 1 Visit OT Treatments $Self Care/Home Management : 8-22  mins $Therapeutic Activity: 8-22 mins  Jefferey Pica, OTR/L Acute Rehabilitation Services Pager: [phone] Office: [phone]    Aisley Whan C 03/22/2021, 4:17 PM

## 2021-03-22 NOTE — Progress Notes (Signed)
PROGRESS NOTE    Shane [name]  OVF:[id number] DOB: 22-Oct-1941 DOA: 03/17/2021 PCP: Burnard Bunting, MD    Brief Narrative:  80 year old gentleman with history of chronic A. 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 Apparently patient took his Eliquis before surgery. 4/18, stabilized and waiting for rehab.   Assessment & Plan:   Active Problems:   Abscess of bursa of right ankle   Atherosclerosis of native arteries of extremities with gangrene, right leg (HCC)   Postoperative hemorrhagic shock  Hemorrhagic shock secondary to excessive blood loss status post elective below-knee amputation in the setting of chronic anticoagulation therapy: Also exacerbated by use of antihypertensives. Hemoglobin dropped from 9.4-6 postprocedure. Total 3 units of PRBC transfusion.  Stabilized.  Transiently on Levophed. Hemoglobin has remained more than 8 and appropriately responded. Wound care instructions as per surgery. Wound VAC and follow-up as per surgery.  Chronic A. fib anticoagulated on Eliquis: His heart rate is acceptable.  Holding all antihypertensives and Eliquis.  Resumed low-dose beta-blockers. Resuming Eliquis as per surgery.  History of type II heart block requiring pacemaker and removal due to infection.  Stable as per cardiology.  Hypertension: Holding all antihypertensives.  His heart rate is acceptable, will resume metoprolol in low doses.  Idiopathic pulmonary fibrosis: Currently on surveillance with pulmonary as outpatient.  Obstructive sleep apnea: Using CPAP at night.  Type 2 diabetes: On Farxiga and short  acting insulin.  Continue.  Added Lantus.  CKD stage IIIb: Baseline creatinine about 1.6.  Remains at about baseline.  Hyperkalemia: Improved.   DVT prophylaxis: SCD's Start: 03/17/21 1519   Code Status: Full code Family Communication: Wife at the bedside. Disposition Plan: Status is: Inpatient  Remains inpatient appropriate because:IV treatments appropriate due to intensity of illness or inability to take PO and Inpatient level of care appropriate due to severity of illness   Dispo: The patient is from: Home              Anticipated d/c is to: Acute inpatient rehab.              Patient currently is medically stable.   Difficult to place patient No         Consultants:   PCCM  Orthopedics  Procedures:   Right BKA 4/13  Antimicrobials:   None   Subjective: Patient seen and examined.  No overnight events.  Denies any pain.  Has not used any pain medicine for last 24 hours.  Telemetry shows a flutter but rate controlled.  Wife at the bedside.  Looking forward to go to rehab.   Objective: Vitals:   03/21/21 1710 03/21/21 1937 03/22/21 0400 03/22/21 0807  BP: (!) 100/49 (!) 90/57 (!) 99/57 101/65  Pulse: (!) 56 (!) 55 (!) 54 (!) 51  Resp: 18 19 16    Temp: 98.2 F (36.8 C) 98.7 F (37.1 C) 97.9 F (36.6 C)   TempSrc: Oral Oral Axillary   SpO2: 100% 100% 96%   Weight:      Height:        Intake/Output Summary (Last 24 hours) at 03/22/2021 1029 Last data filed at 03/22/2021 0807 Gross per 24 hour  Intake 360 ml  Output 295 ml  Net 65 ml   Filed Weights   03/18/21 1655 03/20/21 0415 03/21/21 0400  Weight: 106.8 kg 104.8 kg 113.1 kg    Examination:  General: Patient appears comfortable today.  Not in distress. Cardiovascular: S1-S2 normal.  Tachycardic. Respiratory: Bilateral clear. Gastrointestinal: Soft and nontender.  Bowel sounds present. Ext: Right BKA stump on compression dressing, without soakage. Wound VAC canister empty. Neuro: No focal  deficit.   Data Reviewed: I have personally reviewed following labs and imaging studies  CBC: Recent Labs  Lab 03/18/21 0347 03/18/21 1652 03/19/21 0043 03/20/21 0046 03/21/21 0312 03/22/21 0828  WBC 9.0 13.1* 13.4* 10.0 10.0  --   NEUTROABS  --   --   --   --  6.1  --   HGB 6.0* 8.5* 8.0* 7.3* 8.5* 8.6*  HCT 19.2* 26.1* 25.2* 22.5* 26.5* 26.5*  MCV 87.7 88.2 87.8 87.2 89.2  --   PLT 254 289 279 207 213  --    Basic Metabolic Panel: Recent Labs  Lab 03/18/21 0347 03/18/21 1652 03/19/21 0043 03/20/21 0046 03/21/21 0312  NA 130* 130* 128* 130* 129*  K 5.4* 5.5* 4.9 4.5 4.5  CL 94* 95* 94* 94* 94*  CO2 27 26 27 27 28   GLUCOSE 220* 250* 191* 168* 152*  BUN 42* 43* 44* 35* 31*  CREATININE 1.67* 1.78* 1.85* 1.57* 1.35*  CALCIUM 8.1* 8.0* 8.0* 8.0* 8.0*  MG  --   --   --   --  2.0   GFR: Estimated Creatinine Clearance: 57.6 mL/min (A) (by C-G formula based on SCr of 1.35 mg/dL (H)). Liver Function Tests: No results for input(s): AST, ALT, ALKPHOS, BILITOT, PROT, ALBUMIN in the last 168 hours. No results for input(s): LIPASE, AMYLASE in the last 168 hours. No results for input(s): AMMONIA in the last 168 hours. Coagulation Profile: No results for input(s): INR, PROTIME in the last 168 hours. Cardiac Enzymes: No results for input(s): CKTOTAL, CKMB, CKMBINDEX, TROPONINI in the last 168 hours. BNP (last 3 results) No results for input(s): PROBNP in the last 8760 hours. HbA1C: No results for input(s): HGBA1C in the last 72 hours. CBG: Recent Labs  Lab 03/21/21 0806 03/21/21 1200 03/21/21 1714 03/21/21 2119 03/22/21 0748  GLUCAP 123* 186* 150* 193* 126*   Lipid Profile: No results for input(s): CHOL, HDL, LDLCALC, TRIG, CHOLHDL, LDLDIRECT in the last 72 hours. Thyroid Function Tests: No results for input(s): TSH, T4TOTAL, FREET4, T3FREE, THYROIDAB in the last 72 hours. Anemia Panel: No results for input(s): VITAMINB12, FOLATE, FERRITIN, TIBC, IRON, RETICCTPCT in  the last 72 hours. Sepsis Labs: No results for input(s): PROCALCITON, LATICACIDVEN in the last 168 hours.  Recent Results (from the past 240 hour(s))  SARS CORONAVIRUS 2 (TAT 6-24 HRS) Nasopharyngeal Nasopharyngeal Swab     Status: None   Collection Time: 03/16/21  1:32 PM   Specimen: Nasopharyngeal Swab  Result Value Ref Range Status   SARS Coronavirus 2 NEGATIVE NEGATIVE Final    Comment: (NOTE) SARS-CoV-2 target nucleic acids are NOT DETECTED.  The SARS-CoV-2 RNA is generally detectable in upper and lower respiratory specimens during the acute phase of infection. Negative results do not preclude SARS-CoV-2 infection, do not rule out co-infections with other pathogens, and should not be used as the sole basis for treatment or other patient management decisions. Negative results must be combined with clinical observations, patient history, and epidemiological information. The expected result is Negative.  Fact Sheet for Patients: SugarRoll.be  Fact Sheet  for Healthcare Providers: https://www.woods-mathews.com/  This test is not yet approved or cleared by the [place] and  has been authorized for detection and/or diagnosis of SARS-CoV-2 by FDA under an Emergency Use Authorization (EUA). This EUA will remain  in effect (meaning this test can be used) for the duration of the COVID-19 declaration under Se ction 564(b)(1) of the Act, 21 U.S.C. section 360bbb-3(b)(1), unless the authorization is terminated or revoked sooner.  Performed at [facility], [place] [address] [place], [place] [address]   MRSA PCR Screening     Status: None   Collection Time: 03/18/21  5:04 PM   Specimen: Nasopharyngeal  Result Value Ref Range Status   MRSA by PCR NEGATIVE NEGATIVE Final    Comment:        The GeneXpert MRSA Assay (FDA approved for NASAL specimens only), is one component of a comprehensive MRSA colonization surveillance  program. It is not intended to diagnose MRSA infection nor to guide or monitor treatment for MRSA infections. Performed at [facility], [place] [address], [place], [place] [address]          Radiology Studies: No results found.      Scheduled Meds: . (feeding supplement) PROSource Plus  30 mL Oral BID BM  . vitamin C  1,000 mg Oral Daily  . atorvastatin  20 mg Oral BID  . Chlorhexidine Gluconate Cloth  6 each Topical Daily  . docusate sodium  100 mg Oral Daily  . escitalopram  10 mg Oral Daily  . feeding supplement (GLUCERNA SHAKE)  237 mL Oral TID BM  . gabapentin  300 mg Oral TID  . insulin aspart  0-15 Units Subcutaneous TID WC  . insulin glargine  8 Units Subcutaneous QHS  . iron polysaccharides  150 mg Oral Q lunch  . LORazepam  1 mg Oral QHS  . magnesium oxide  400 mg Oral Daily  . [START ON 03/23/2021] metoprolol tartrate  12.5 mg Oral Daily  . multivitamin with minerals  1 tablet Oral Daily  . pantoprazole  40 mg Oral Daily  . zinc sulfate  220 mg Oral Daily   Continuous Infusions: . sodium chloride       LOS: 5 days    Time spent: 30 minutes    Barb Merino, MD [facility] Pager [phone]

## 2021-03-22 NOTE — Plan of Care (Signed)

## 2021-03-23 LAB — GLUCOSE, CAPILLARY
Glucose-Capillary: 145 mg/dL — ABNORMAL HIGH (ref 70–99)
Glucose-Capillary: 185 mg/dL — ABNORMAL HIGH (ref 70–99)
Glucose-Capillary: 151 mg/dL — ABNORMAL HIGH (ref 70–99)
Glucose-Capillary: 192 mg/dL — ABNORMAL HIGH (ref 70–99)

## 2021-03-23 NOTE — Progress Notes (Signed)
Inpatient Rehab Admissions Coordinator:   Notified by Bernadene Bell that request for CIR has been denied.  Dr. Sloan Leiter completed peer to peer and denial upheld.  Family would like to pursue expedited appeal for CIR, and I have sent the request to [facility] [employer] for review.  Expect to hear back within 72 hours.   Shann Medal, PT, DPT Admissions Coordinator [phone] 03/23/21  10:55 AM

## 2021-03-23 NOTE — Plan of Care (Signed)

## 2021-03-23 NOTE — Progress Notes (Signed)
Patient is postop day 6 status post below-knee amputation.  He is doing well and his labs have stabilized.  He feels comfortable and is making good progress with physical therapy  Wound VAC is still functioning with 1 green check no new drainage noted in canister  Patient may discharge to inpatient rehab when bed available

## 2021-03-23 NOTE — Progress Notes (Signed)
PROGRESS NOTE    Shane [name]  JXB:[id number] DOB: July 24, 1941 DOA: 03/17/2021 PCP: Burnard Bunting, MD    Brief Narrative:  80 year old gentleman with history of chronic A. 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 Apparently patient took his Eliquis before surgery. 4/18, stabilized and waiting for rehab. 4/19, insurance declined request for CIR.  Peer to peer completed with [facility].  Declined stating that patient will need longer subacute rehab. Given patient's excellent rehab potential I believe patient is one of the best candidate to go to CIR level of care.   Assessment & Plan:   Active Problems:   Abscess of bursa of right ankle   Atherosclerosis of native arteries of extremities with gangrene, right leg (HCC)   Postoperative hemorrhagic shock  Hemorrhagic shock secondary to excessive blood loss status post elective below-knee amputation in the setting of chronic anticoagulation therapy: Also exacerbated by use of antihypertensives. Hemoglobin dropped from 9.4-6 postprocedure. Total 3 units of PRBC transfusion.  Stabilized.  Transiently on Levophed. Hemoglobin has remained more than 8 and appropriately responded. Wound care instructions as per surgery. Wound VAC and follow-up as per surgery.  Plan to remove dressing in 1 week.  Chronic A. fib anticoagulated on Eliquis: His heart rate is acceptable.  Holding all antihypertensives and Eliquis.  Resumed low-dose beta-blockers.  Resume Eliquis when okay with surgery.  History of type II heart block requiring pacemaker and removal due to infection.   Stable as per cardiology.  Hypertension: Holding all antihypertensives.  His heart rate is acceptable, started metoprolol in low doses.  Idiopathic pulmonary fibrosis: Currently on surveillance with pulmonary as outpatient.  Obstructive sleep apnea: Using CPAP at night.  Type 2 diabetes: On Farxiga and short acting insulin.  Continue.  Added Lantus.  CKD stage IIIb: Baseline creatinine about 1.6.  Remains at about baseline.  Hyperkalemia: Improved.  Patient is medically stable.  He is very appropriate candidate for aggressive acute inpatient rehab.    DVT prophylaxis: SCD's Start: 03/17/21 1519   Code Status: Full code Family Communication: None today. Disposition Plan: Status is: Inpatient  Remains inpatient appropriate because:IV treatments appropriate due to intensity of illness or inability to take PO and Inpatient level of care appropriate due to severity of illness   Dispo: The patient is from: Home              Anticipated d/c is to: Acute inpatient rehab.              Patient currently is medically stable.   Difficult to place patient No         Consultants:   PCCM  Orthopedics  Procedures:   Right BKA 4/13  Antimicrobials:   None   Subjective: Patient seen and examined.  No overnight events.  He was working with PT OT.  Denies any pain.  He has not used any pain medicine for last 48 hours.  He is very eager to go for aggressive rehab.   Objective: Vitals:   03/22/21 1457 03/22/21 2000 03/23/21 0500 03/23/21 0800  BP: 116/67 114/64 (!) 122/55 (!) 111/59  Pulse: 64 72 62 77  Resp: 20 20 15  20  Temp: 97.8 F (36.6 C) 97.8 F (36.6 C) 97.9 F (36.6 C)   TempSrc: Oral Axillary Axillary   SpO2: 100% 100% 99% 99%  Weight:      Height:        Intake/Output Summary (Last 24 hours) at 03/23/2021 1239 Last data filed at 03/23/2021 0900 Gross per 24 hour  Intake 240 ml  Output 800 ml  Net -560 ml   Filed Weights   03/18/21 1655 03/20/21 0415  03/21/21 0400  Weight: 106.8 kg 104.8 kg 113.1 kg    Examination:  General: Patient appears comfortable. Not in any distress. Cardiovascular: S1-S2 normal.  Tachycardic. Respiratory: Bilateral clear. Gastrointestinal: Soft and nontender.  Bowel sounds present. Ext: Right BKA stump on compression dressing, without soakage. Wound VAC canister empty. Neuro: No focal deficit.   Data Reviewed: I have personally reviewed following labs and imaging studies  CBC: Recent Labs  Lab 03/18/21 0347 03/18/21 1652 03/19/21 0043 03/20/21 0046 03/21/21 0312 03/22/21 0828  WBC 9.0 13.1* 13.4* 10.0 10.0  --   NEUTROABS  --   --   --   --  6.1  --   HGB 6.0* 8.5* 8.0* 7.3* 8.5* 8.6*  HCT 19.2* 26.1* 25.2* 22.5* 26.5* 26.5*  MCV 87.7 88.2 87.8 87.2 89.2  --   PLT 254 289 279 207 213  --    Basic Metabolic Panel: Recent Labs  Lab 03/18/21 0347 03/18/21 1652 03/19/21 0043 03/20/21 0046 03/21/21 0312  NA 130* 130* 128* 130* 129*  K 5.4* 5.5* 4.9 4.5 4.5  CL 94* 95* 94* 94* 94*  CO2 27 26 27 27 28   GLUCOSE 220* 250* 191* 168* 152*  BUN 42* 43* 44* 35* 31*  CREATININE 1.67* 1.78* 1.85* 1.57* 1.35*  CALCIUM 8.1* 8.0* 8.0* 8.0* 8.0*  MG  --   --   --   --  2.0   GFR: Estimated Creatinine Clearance: 57.6 mL/min (A) (by C-G formula based on SCr of 1.35 mg/dL (H)). Liver Function Tests: No results for input(s): AST, ALT, ALKPHOS, BILITOT, PROT, ALBUMIN in the last 168 hours. No results for input(s): LIPASE, AMYLASE in the last 168 hours. No results for input(s): AMMONIA in the last 168 hours. Coagulation Profile: No results for input(s): INR, PROTIME in the last 168 hours. Cardiac Enzymes: No results for input(s): CKTOTAL, CKMB, CKMBINDEX, TROPONINI in the last 168 hours. BNP (last 3 results) No results for input(s): PROBNP in the last 8760 hours. HbA1C: No results for input(s): HGBA1C in the last 72 hours. CBG: Recent Labs  Lab 03/22/21 1228 03/22/21 1716 03/22/21 2052  03/23/21 0741 03/23/21 1149  GLUCAP 169* 166* 185* 145* 185*   Lipid Profile: No results for input(s): CHOL, HDL, LDLCALC, TRIG, CHOLHDL, LDLDIRECT in the last 72 hours. Thyroid Function Tests: No results for input(s): TSH, T4TOTAL, FREET4, T3FREE, THYROIDAB in the last 72 hours. Anemia Panel: No results for input(s): VITAMINB12, FOLATE, FERRITIN, TIBC, IRON, RETICCTPCT in the last 72 hours. Sepsis Labs: No results for input(s): PROCALCITON, LATICACIDVEN in the last 168 hours.  Recent Results (from the past 240 hour(s))  SARS CORONAVIRUS 2 (TAT 6-24 HRS) Nasopharyngeal Nasopharyngeal Swab     Status: None   Collection Time: 03/16/21  1:32 PM   Specimen: Nasopharyngeal Swab  Result Value Ref Range Status   SARS Coronavirus 2 NEGATIVE NEGATIVE Final    Comment: (NOTE) SARS-CoV-2 target nucleic acids are NOT DETECTED.  The SARS-CoV-2 RNA is generally detectable in upper and lower respiratory specimens during  the acute phase of infection. Negative results do not preclude SARS-CoV-2 infection, do not rule out co-infections with other pathogens, and should not be used as the sole basis for treatment or other patient management decisions. Negative results must be combined with clinical observations, patient history, and epidemiological information. The expected result is Negative.  Fact Sheet for Patients: SugarRoll.be  Fact Sheet for Healthcare Providers: https://www.woods-mathews.com/  This test is not yet approved or cleared by the [place] FDA and  has been authorized for detection and/or diagnosis of SARS-CoV-2 by FDA under an Emergency Use Authorization (EUA). This EUA will remain  in effect (meaning this test can be used) for the duration of the COVID-19 declaration under Se ction 564(b)(1) of the Act, 21 U.S.C. section 360bbb-3(b)(1), unless the authorization is terminated or revoked sooner.  Performed at [facility], [place] [address] [place], [place] [address]   MRSA PCR Screening     Status: None   Collection Time: 03/18/21  5:04 PM   Specimen: Nasopharyngeal  Result Value Ref Range Status   MRSA by PCR NEGATIVE NEGATIVE Final    Comment:        The GeneXpert MRSA Assay (FDA approved for NASAL specimens only), is one component of a comprehensive MRSA colonization surveillance program. It is not intended to diagnose MRSA infection nor to guide or monitor treatment for MRSA infections. Performed at [facility], [place] [address], [place], [place] [address]          Radiology Studies: No results found.      Scheduled Meds: . (feeding supplement) PROSource Plus  30 mL Oral BID BM  . vitamin C  1,000 mg Oral Daily  . atorvastatin  20 mg Oral BID  . Chlorhexidine Gluconate Cloth  6 each Topical Daily  . docusate sodium  100 mg Oral Daily  . escitalopram  10 mg Oral Daily  . feeding supplement (GLUCERNA SHAKE)  237 mL Oral TID BM  . gabapentin  300 mg Oral TID  . insulin aspart  0-15 Units Subcutaneous TID WC  . insulin glargine  8 Units Subcutaneous QHS  . iron polysaccharides  150 mg Oral Q lunch  . LORazepam  1 mg Oral QHS  . magnesium oxide  400 mg Oral Daily  . metoprolol tartrate  12.5 mg Oral Daily  . multivitamin with minerals  1 tablet Oral Daily  . pantoprazole  40 mg Oral Daily  . zinc sulfate  220 mg Oral Daily   Continuous Infusions: . sodium chloride       LOS: 6 days    Time spent: 30 minutes    Barb Merino, MD [facility] Pager [phone]

## 2021-03-23 NOTE — Progress Notes (Signed)
Physical Therapy Treatment Patient Details Name: Shane [name] MRN: [id number] DOB: [birth year] Today's Date: 03/23/2021    History of Present Illness 80 year old male admitted on 4/13 for BKA due to non-healing wound. He was noted to take his eliquis the morning of the procedure and has been transferred to the ICU postoperatively for hypotension related to blood loss. He has received 2 units of PRBCs and was started on peripheral levophed. PMHx: idiopathic pulmonary fibrosis, CKD III, atrial fibrillation, OSA on CPAP, CAD, DM II, and type II AV block with pacemaker placement 03/2020 s/p removal due to infection, recently had I&D of Rt ankle on 02/19/21    PT Comments    Pt is making good progress towards his goals, has slight lightheadedness with positional change however BP stable and feel dissipates quickly. Pt able to perform L sidelying, R LE ROM exercises prior to getting up. Pt is min guard for bed mobility and min Ax2 for transfers and ambulation of 3 feet with RW. Focus of session sequencing and UE strengthening in order to offweight L LE for advancement of gait. D/c plan remains appropriate at this time. PT will continue to follow acutely.    Follow Up Recommendations  CIR     Equipment Recommendations  None recommended by PT    Recommendations for Other Services Rehab consult     Precautions / Restrictions Precautions Precautions: Fall Precaution Comments: monitor BP Restrictions Weight Bearing Restrictions: Yes RLE Weight Bearing: Non weight bearing    Mobility  Bed Mobility Overal bed mobility: Needs Assistance Bed Mobility: Supine to Sit     Supine to sit: Min guard     General bed mobility comments: min/guard for safety and lines    Transfers Overall transfer level: Needs assistance Equipment used: Rolling walker (2 wheeled) Transfers: Sit to/from [employer] Transfers Sit to Stand: +2 safety/equipment;Min assist;From elevated surface;Min  guard Stand pivot transfers: Min assist       General transfer comment: min A for steadying with coming to standing, good hand placement without cuing. min guard for 2 additional sit<>stand from recliner  Ambulation/Gait Ambulation/Gait assistance: Min assist;+2 safety/equipment Gait Distance (Feet): 3 Feet Assistive device: Rolling walker (2 wheeled) Gait Pattern/deviations:  (sliding foot forward, hop-to pattern)     General Gait Details: minA x2 for steadying, vc for proximity to RW to maximize UE mechanical advantage to offweight L LE to progress mobility, hop forward 3 feet, pivot on ball of foot and hop back 2 feet       Balance Overall balance assessment: Needs assistance Sitting-balance support: Feet supported;Single extremity supported Sitting balance-Leahy Scale: Fair     Standing balance support: Bilateral upper extremity supported Standing balance-Leahy Scale: Poor Standing balance comment: requires UE support                            Cognition Arousal/Alertness: Awake/alert Behavior During Therapy: WFL for tasks assessed/performed Overall Cognitive Status: Within Functional Limits for tasks assessed                                        Exercises Total Joint Exercises Quad Sets: Right;10 reps;AROM Amputee Exercises Hip Extension: Sidelying;AROM;Right;10 reps Hip ABduction/ADduction: AROM;10 reps;Sidelying;Right Knee Extension: AROM;Right;5 reps Other Exercises Other Exercises: push up on RW x 5    General Comments General comments (skin integrity, edema, etc.):  VSS on RA      Pertinent Vitals/Pain Pain Assessment: Faces Faces Pain Scale: Hurts a little bit Pain Location: R residual limb Pain Descriptors / Indicators: Aching Pain Intervention(s): Limited activity within patient's tolerance;Monitored during session;Repositioned           PT Goals (current goals can now be found in the care plan section) Acute Rehab  PT Goals Patient Stated Goal: would eventually like to have prosthesis PT Goal Formulation: With patient Time For Goal Achievement: 04/02/21 Potential to Achieve Goals: Good Progress towards PT goals: Progressing toward goals    Frequency    Min 4X/week      PT Plan Current plan remains appropriate       AM-PAC PT "6 Clicks" Mobility   Outcome Measure  Help needed turning from your back to your side while in a flat bed without using bedrails?: A Little Help needed moving from lying on your back to sitting on the side of a flat bed without using bedrails?: A Little Help needed moving to and from a bed to a chair (including a wheelchair)?: A Lot Help needed standing up from a chair using your arms (e.g., wheelchair or bedside chair)?: A Lot Help needed to walk in hospital room?: Total Help needed climbing 3-5 steps with a railing? : Total 6 Click Score: 12    End of Session Equipment Utilized During Treatment: Gait belt Activity Tolerance: Patient tolerated treatment well Patient left: in chair;with call bell/phone within reach;with family/visitor present Nurse Communication: Mobility status PT Visit Diagnosis: Other abnormalities of gait and mobility (R26.89)     Time: 1021-1173 PT Time Calculation (min) (ACUTE ONLY): 28 min  Charges:  $Gait Training: 8-22 mins $Therapeutic Exercise: 8-22 mins                     Tamkia Temples B. Migdalia Dk PT, DPT Acute Rehabilitation Services Pager [phone] Office [phone]    [place] 03/23/2021, 11:22 AM

## 2021-03-24 ENCOUNTER — Inpatient Hospital Stay (HOSPITAL_COMMUNITY): Payer: Medicare Other

## 2021-03-24 LAB — GLUCOSE, CAPILLARY
Glucose-Capillary: 170 mg/dL — ABNORMAL HIGH (ref 70–99)
Glucose-Capillary: 180 mg/dL — ABNORMAL HIGH (ref 70–99)
Glucose-Capillary: 168 mg/dL — ABNORMAL HIGH (ref 70–99)
Glucose-Capillary: 162 mg/dL — ABNORMAL HIGH (ref 70–99)

## 2021-03-24 MED ORDER — LIDOCAINE VISCOUS HCL 2 % MT SOLN
15.0000 mL | Freq: Once | OROMUCOSAL | Status: AC
  Administered 2021-03-24: 15 mL via ORAL
  Filled 2021-03-24: qty 15

## 2021-03-24 MED ORDER — INSULIN ASPART 100 UNIT/ML ~~LOC~~ SOLN
4.0000 [IU] | Freq: Three times a day (TID) | SUBCUTANEOUS | Status: DC
  Administered 2021-03-24 – 2021-03-26 (×4): 4 [IU] via SUBCUTANEOUS

## 2021-03-24 MED ORDER — APIXABAN 5 MG PO TABS
5.0000 mg | ORAL_TABLET | Freq: Two times a day (BID) | ORAL | Status: DC
  Administered 2021-03-24 – 2021-03-26 (×5): 5 mg via ORAL
  Filled 2021-03-24 (×5): qty 1

## 2021-03-24 MED ORDER — ALBUTEROL SULFATE (2.5 MG/3ML) 0.083% IN NEBU: 2.5000 mg | INHALATION_SOLUTION | Freq: Four times a day (QID) | RESPIRATORY_TRACT | Status: DC | PRN

## 2021-03-24 MED ORDER — INSULIN GLARGINE 100 UNIT/ML ~~LOC~~ SOLN
12.0000 [IU] | Freq: Every day | SUBCUTANEOUS | Status: DC
  Administered 2021-03-24 – 2021-03-25 (×2): 12 [IU] via SUBCUTANEOUS
  Filled 2021-03-24 (×3): qty 0.12

## 2021-03-24 MED ORDER — ALUM & MAG HYDROXIDE-SIMETH 200-200-20 MG/5ML PO SUSP
30.0000 mL | Freq: Once | ORAL | Status: AC
  Administered 2021-03-24: 30 mL via ORAL
  Filled 2021-03-24: qty 30

## 2021-03-24 NOTE — Discharge Instructions (Signed)

## 2021-03-24 NOTE — Progress Notes (Signed)
Physical Therapy Treatment Patient Details Name: Shane [name] MRN: [id number] DOB: 07/18/1941 Today's Date: 03/24/2021    History of Present Illness 80 year old male admitted on 4/13 for BKA due to non-healing wound. He was noted to take his eliquis the morning of the procedure and has been transferred to the ICU postoperatively for hypotension related to blood loss. He has received 2 units of PRBCs and was started on peripheral levophed. PMHx: idiopathic pulmonary fibrosis, CKD III, atrial fibrillation, OSA on CPAP, CAD, DM II, and type II AV block with pacemaker placement 03/2020 s/p removal due to infection, recently had I&D of Rt ankle on 02/19/21    PT Comments    Pt expresses disappointment with insurance company decision about CIR. Pt reports he and his wife are trying to appeal. Pt continues to be highly motivated to return to PLOF with prosthetic placement. Pt is currently limited in safe mobility by generalized weakness and balance deficits from change in CoG. Pt is min guard for bed mobility, and min Ax2 for transfers and ambulation. Focus of session on UE and LE strengthening to aide in improved mobility. PT continues to highly recommend CIR level rehab at discharge. PT will continue to follow acutely.      Follow Up Recommendations  CIR     Equipment Recommendations  None recommended by PT    Recommendations for Other Services Rehab consult     Precautions / Restrictions Precautions Precautions: Fall Precaution Comments: monitor BP Restrictions Weight Bearing Restrictions: Yes RLE Weight Bearing: Non weight bearing    Mobility  Bed Mobility Overal bed mobility: Needs Assistance Bed Mobility: Supine to Sit     Supine to sit: Min guard     General bed mobility comments: min/guard for safety and lines    Transfers Overall transfer level: Needs assistance Equipment used: Rolling walker (2 wheeled) Transfers: Sit to/from [employer] Transfers Sit to  Stand: +2 safety/equipment;Min assist;From elevated surface;Min guard Stand pivot transfers: Min assist       General transfer comment: min A for steadying with coming to standing, good hand placement without cuing. min Ax2 for blocking straight back chair for power up and steadying  Ambulation/Gait Ambulation/Gait assistance: Min assist;+2 safety/equipment Gait Distance (Feet): 3 Feet Assistive device: Rolling walker (2 wheeled) Gait Pattern/deviations:  (pivot on ball of foot, hop-to pattern) Gait velocity: slowed Gait velocity interpretation: <1.31 ft/sec, indicative of household ambulator General Gait Details: minA x2 for steadying, utilized combination of straight line hopping and coming up on ball of foot to pivot for directional change          Balance Overall balance assessment: Needs assistance Sitting-balance support: Feet supported;Single extremity supported Sitting balance-Leahy Scale: Fair     Standing balance support: Bilateral upper extremity supported Standing balance-Leahy Scale: Poor Standing balance comment: requires UE support                            Cognition Arousal/Alertness: Awake/alert Behavior During Therapy: WFL for tasks assessed/performed Overall Cognitive Status: Within Functional Limits for tasks assessed                                        Exercises General Exercises - Lower Extremity Long Arc Quad: AROM;Both;10 reps;Seated Hip Flexion/Marching: AROM;Both;10 reps;Seated Amputee Exercises Hip ABduction/ADduction: AROM;10 reps;Seated;Both Other Exercises Other Exercises: chair push ups on  straight back chair x10    General Comments General comments (skin integrity, edema, etc.): VSS on RA      Pertinent Vitals/Pain Pain Assessment: No/denies pain           PT Goals (current goals can now be found in the care plan section) Acute Rehab PT Goals Patient Stated Goal: would eventually like to have  prosthesis PT Goal Formulation: With patient Time For Goal Achievement: 04/02/21 Potential to Achieve Goals: Good Progress towards PT goals: Progressing toward goals    Frequency    Min 4X/week      PT Plan Current plan remains appropriate       AM-PAC PT "6 Clicks" Mobility   Outcome Measure  Help needed turning from your back to your side while in a flat bed without using bedrails?: A Little Help needed moving from lying on your back to sitting on the side of a flat bed without using bedrails?: A Little Help needed moving to and from a bed to a chair (including a wheelchair)?: A Lot Help needed standing up from a chair using your arms (e.g., wheelchair or bedside chair)?: A Lot Help needed to walk in hospital room?: Total Help needed climbing 3-5 steps with a railing? : Total 6 Click Score: 12    End of Session Equipment Utilized During Treatment: Gait belt Activity Tolerance: Patient tolerated treatment well Patient left: in chair;with call bell/phone within reach;Other (comment) (pt left in straight back chair as recliner uncomfortable on sacral wound, advised to sit only for a short time, NT in room and agreeable to plan) Nurse Communication: Mobility status PT Visit Diagnosis: Other abnormalities of gait and mobility (R26.89)     Time: 1224-8250 PT Time Calculation (min) (ACUTE ONLY): 29 min  Charges:  $Therapeutic Exercise: 8-22 mins $Therapeutic Activity: 8-22 mins                     Kyston Gonce B. Migdalia Dk PT, DPT Acute Rehabilitation Services Pager [phone] Office [phone]    [place] 03/24/2021, 9:20 AM

## 2021-03-24 NOTE — Progress Notes (Signed)
PROGRESS NOTE    Shane [name]  ACZ:[id number] DOB: [birth year] DOA: 03/17/2021 PCP: Burnard Bunting, MD    Brief Narrative:  80 year old gentleman with history of chronic A. 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 Apparently patient took his Eliquis before surgery.  4/18, stabilized and waiting for rehab. 4/19, insurance declined request for CIR.  Peer to peer completed with [facility].  Declined stating that patient will need longer subacute rehab. Given patient's excellent rehab potential and controlled medical issues, he is very strong candidate to do aggressive rehab.  I recommend acute inpatient rehab.   Assessment & Plan:   Active Problems:   Abscess of bursa of right ankle   Atherosclerosis of native arteries of extremities with gangrene, right leg (HCC)   Postoperative hemorrhagic shock  Hemorrhagic shock secondary to excessive blood loss status post elective below-knee amputation in the setting of chronic anticoagulation therapy: Also exacerbated by use of antihypertensives. Hemoglobin dropped from 9.4-6 postprocedure. Total 3 units of PRBC transfusion.  Stabilized.  Transiently on Levophed. Hemoglobin has remained more than 8 and appropriately responded. Wound VAC removed today 4/20. Postop wound care instructions as per surgery. Resuming Eliquis today.  Chronic A. fib anticoagulated on Eliquis: His heart rate is acceptable.  Holding all antihypertensives and Eliquis.  Low-dose beta-blockers.  Eliquis resumed.  No evidence of ongoing bleeding.  History of type II heart block requiring pacemaker and  removal due to infection.  Stable as per cardiology.  Hypertension: Holding all antihypertensives.  His heart rate is acceptable, started metoprolol in low doses. We will continue to hold antihypertensives until rehab and follow-up.  Idiopathic pulmonary fibrosis: Currently on surveillance with pulmonary as outpatient.  Obstructive sleep apnea: Using CPAP at night.  Type 2 diabetes: On Farxiga and mealtime insulin at home.  Added long-acting insulin along with prandial insulin.  Will increase dose today.  Lantus 12 units at night, NovoLog 4 units with meals.  CKD stage IIIb: Baseline creatinine about 1.6.  Remains at about baseline.  Hyperkalemia: Improved.  Patient is medically stable.  He is very appropriate candidate for aggressive acute inpatient rehab.    DVT prophylaxis: SCD's Start: 03/17/21 1519   Code Status: Full code Family Communication: None. Disposition Plan: Status is: Inpatient  Remains inpatient appropriate because:IV treatments appropriate due to intensity of illness or inability to take PO and Inpatient level of care appropriate due to severity of illness   Dispo: The patient is from: Home              Anticipated d/c is to: Acute inpatient rehab.              Patient currently is medically stable.   Difficult to place patient No         Consultants:   PCCM  Orthopedics  Procedures:   Right BKA 4/13  Antimicrobials:   None   Subjective: Patient seen and examined.  No overnight events.  He is highly motivated.  Today he is sitting in chair and was happy with removal of wound VAC and change in dressing.  Moderate pain while mobility otherwise pain is controlled.   Objective: Vitals:   03/23/21 1830  03/23/21 2000 03/24/21 0447 03/24/21 1143  BP:  (!) 116/57 99/85 119/64  Pulse:  (!) 56 (!) 56 (!) 57  Resp: 20 18 19 16   Temp:  98.2 F (36.8 C) 99.1 F (37.3 C) (!) 97.5 F (36.4 C)  TempSrc:  Oral Oral Oral  SpO2:  100% 100% 100%   Weight:      Height:        Intake/Output Summary (Last 24 hours) at 03/24/2021 1339 Last data filed at 03/24/2021 1144 Gross per 24 hour  Intake 120 ml  Output 300 ml  Net -180 ml   Filed Weights   03/18/21 1655 03/20/21 0415 03/21/21 0400  Weight: 106.8 kg 104.8 kg 113.1 kg    Examination:  General: Patient appears comfortable. Not in any distress. Cardiovascular: S1-S2 normal.  Irregularly irregular. Respiratory: Bilateral clear. Gastrointestinal: Soft and nontender.  Bowel sounds present. Ext: Right BKA stump on compression dressing wound VAC removed.   Neuro: No focal deficit.   Data Reviewed: I have personally reviewed following labs and imaging studies  CBC: Recent Labs  Lab 03/18/21 0347 03/18/21 1652 03/19/21 0043 03/20/21 0046 03/21/21 0312 03/22/21 0828  WBC 9.0 13.1* 13.4* 10.0 10.0  --   NEUTROABS  --   --   --   --  6.1  --   HGB 6.0* 8.5* 8.0* 7.3* 8.5* 8.6*  HCT 19.2* 26.1* 25.2* 22.5* 26.5* 26.5*  MCV 87.7 88.2 87.8 87.2 89.2  --   PLT 254 289 279 207 213  --    Basic Metabolic Panel: Recent Labs  Lab 03/18/21 0347 03/18/21 1652 03/19/21 0043 03/20/21 0046 03/21/21 0312  NA 130* 130* 128* 130* 129*  K 5.4* 5.5* 4.9 4.5 4.5  CL 94* 95* 94* 94* 94*  CO2 27 26 27 27 28   GLUCOSE 220* 250* 191* 168* 152*  BUN 42* 43* 44* 35* 31*  CREATININE 1.67* 1.78* 1.85* 1.57* 1.35*  CALCIUM 8.1* 8.0* 8.0* 8.0* 8.0*  MG  --   --   --   --  2.0   GFR: Estimated Creatinine Clearance: 57.6 mL/min (A) (by C-G formula based on SCr of 1.35 mg/dL (H)). Liver Function Tests: No results for input(s): AST, ALT, ALKPHOS, BILITOT, PROT, ALBUMIN in the last 168 hours. No results for input(s): LIPASE, AMYLASE in the last 168 hours. No results for input(s): AMMONIA in the last 168 hours. Coagulation Profile: No results for input(s): INR, PROTIME in the last 168 hours. Cardiac Enzymes: No results for input(s): CKTOTAL, CKMB, CKMBINDEX, TROPONINI in the last 168  hours. BNP (last 3 results) No results for input(s): PROBNP in the last 8760 hours. HbA1C: No results for input(s): HGBA1C in the last 72 hours. CBG: Recent Labs  Lab 03/23/21 1149 03/23/21 1649 03/23/21 2135 03/24/21 0857 03/24/21 1141  GLUCAP 185* 151* 192* 170* 180*   Lipid Profile: No results for input(s): CHOL, HDL, LDLCALC, TRIG, CHOLHDL, LDLDIRECT in the last 72 hours. Thyroid Function Tests: No results for input(s): TSH, T4TOTAL, FREET4, T3FREE, THYROIDAB in the last 72 hours. Anemia Panel: No results for input(s): VITAMINB12, FOLATE, FERRITIN, TIBC, IRON, RETICCTPCT in the last 72 hours. Sepsis Labs: No results for input(s): PROCALCITON, LATICACIDVEN in the last 168 hours.  Recent Results (from the past 240 hour(s))  SARS CORONAVIRUS 2 (TAT 6-24 HRS) Nasopharyngeal Nasopharyngeal Swab     Status: None   Collection Time: 03/16/21  1:32 PM   Specimen: Nasopharyngeal Swab  Result Value Ref Range Status   SARS Coronavirus  2 NEGATIVE NEGATIVE Final    Comment: (NOTE) SARS-CoV-2 target nucleic acids are NOT DETECTED.  The SARS-CoV-2 RNA is generally detectable in upper and lower respiratory specimens during the acute phase of infection. Negative results do not preclude SARS-CoV-2 infection, do not rule out co-infections with other pathogens, and should not be used as the sole basis for treatment or other patient management decisions. Negative results must be combined with clinical observations, patient history, and epidemiological information. The expected result is Negative.  Fact Sheet for Patients: SugarRoll.be  Fact Sheet for Healthcare Providers: https://www.woods-mathews.com/  This test is not yet approved or cleared by the [place] FDA and  has been authorized for detection and/or diagnosis of SARS-CoV-2 by FDA under an Emergency Use Authorization (EUA). This EUA will remain  in effect (meaning this test can be  used) for the duration of the COVID-19 declaration under Se ction 564(b)(1) of the Act, 21 U.S.C. section 360bbb-3(b)(1), unless the authorization is terminated or revoked sooner.  Performed at [facility], [place] [address], [place], [place] [address]   MRSA PCR Screening     Status: None   Collection Time: 03/18/21  5:04 PM   Specimen: Nasopharyngeal  Result Value Ref Range Status   MRSA by PCR NEGATIVE NEGATIVE Final    Comment:        The GeneXpert MRSA Assay (FDA approved for NASAL specimens only), is one component of a comprehensive MRSA colonization surveillance program. It is not intended to diagnose MRSA infection nor to guide or monitor treatment for MRSA infections. Performed at [facility], [place] [address], [place], [place] [address]          Radiology Studies: No results found.      Scheduled Meds: . (feeding supplement) PROSource Plus  30 mL Oral BID BM  . apixaban  5 mg Oral BID  . vitamin C  1,000 mg Oral Daily  . atorvastatin  20 mg Oral BID  . Chlorhexidine Gluconate Cloth  6 each Topical Daily  . docusate sodium  100 mg Oral Daily  . escitalopram  10 mg Oral Daily  . feeding supplement (GLUCERNA SHAKE)  237 mL Oral TID BM  . gabapentin  300 mg Oral TID  . insulin aspart  0-15 Units Subcutaneous TID WC  . insulin aspart  4 Units Subcutaneous TID WC  . insulin glargine  12 Units Subcutaneous QHS  . iron polysaccharides  150 mg Oral Q lunch  . LORazepam  1 mg Oral QHS  . magnesium oxide  400 mg Oral Daily  . metoprolol tartrate  12.5 mg Oral Daily  . multivitamin with minerals  1 tablet Oral Daily  . pantoprazole  40 mg Oral Daily  . zinc sulfate  220 mg Oral Daily   Continuous Infusions: . sodium chloride       LOS: 7 days    Time spent: 30 minutes    Barb Merino, MD [facility] Pager [phone]

## 2021-03-24 NOTE — Progress Notes (Signed)
Orthopedic Tech Progress Note Patient Details:  Shane [name] 25-Jun-1941 [id number] Called in order to [facility] for a VIVE PROTOCOL BK SHRINKER  Patient ID: Shane [name], male   DOB: 1941/09/20, 80 y.o.   MRN: [id number]   [name] 03/24/2021, 8:22 AM

## 2021-03-24 NOTE — Progress Notes (Signed)
Patient is 1 week status post below-knee amputation.  His hemoglobin hematocrit of stabilized and he is overall doing well.  Patient was desiring to go to inpatient rehab however this has been denied even with the peer to peer.  They are appealing this decision  Wound VAC is working in place.  Wound VAC was removed today.  There are well apposed wound edges swelling is well controlled no ascending cellulitis or erythema around the wound.  We will order for daily dry dressing changes.  Also for shrinkers and  shield patient has not yet received these.  Also place a consult to transition of care to start looking at skilled nursing facilities.  Patient and his wife are disappointed but would like to wait for 72 hours but are fine with looking at skilled nursing should inpatient rehab not be an option

## 2021-03-25 LAB — GLUCOSE, CAPILLARY
Glucose-Capillary: 136 mg/dL — ABNORMAL HIGH (ref 70–99)
Glucose-Capillary: 116 mg/dL — ABNORMAL HIGH (ref 70–99)
Glucose-Capillary: 90 mg/dL (ref 70–99)
Glucose-Capillary: 170 mg/dL — ABNORMAL HIGH (ref 70–99)

## 2021-03-25 MED ORDER — ORAL CARE MOUTH RINSE
15.0000 mL | Freq: Two times a day (BID) | OROMUCOSAL | Status: DC
  Administered 2021-03-25 – 2021-03-26 (×3): 15 mL via OROMUCOSAL

## 2021-03-25 MED ORDER — DIPHENHYDRAMINE HCL 25 MG PO CAPS
25.0000 mg | ORAL_CAPSULE | Freq: Four times a day (QID) | ORAL | Status: DC | PRN
  Administered 2021-03-26: 25 mg via ORAL
  Filled 2021-03-25: qty 1

## 2021-03-25 MED ORDER — NYSTATIN 100000 UNIT/GM EX POWD
Freq: Two times a day (BID) | CUTANEOUS | Status: DC
  Filled 2021-03-25: qty 15

## 2021-03-25 MED ORDER — PANTOPRAZOLE SODIUM 40 MG PO TBEC
40.0000 mg | DELAYED_RELEASE_TABLET | Freq: Two times a day (BID) | ORAL | Status: DC
  Administered 2021-03-25 – 2021-03-26 (×2): 40 mg via ORAL
  Filled 2021-03-25 (×2): qty 1

## 2021-03-25 NOTE — PMR Pre-admission (Signed)
PMR Admission Coordinator Pre-Admission Assessment  Patient: Shane [name] is an 80 y.o., male MRN: [id number] DOB: 23-Sep-1941 Height: 6' (182.9 cm) Weight: 113.1 kg  Insurance Information HMO: yes    PPO:      PCP:      IPA:      80/20:      OTHER:  PRIMARY: [employer] Medicare      Policy#: [id number]      Subscriber:  CM Name: Otila Kluver      Phone#: [phone]     Fax#: [phone] Pre-Cert#: [id number] [place] for CIR provided by Otila Kluver with [facility] Medicare Expedited appeals department with updates due to fax listed above 7 days from admit       Employer:  Benefits:  Phone #: [phone]     Name:  Eff. Date: 12/05/20     Deduct: $0      Out of Pocket Max: $3600 ($3052.16)      Life Max: n/a CIR: $295/day for 1-5      SNF: 20 full days  Outpatient:      Co-Pay: $30/visit Home Health: 100%      Co-Pay:  DME: 80%     Co-Ins: 20% Providers: SECONDARY:       Policy#:      Phone#:   Development worker, community:       Phone#:   The "Data Collection Information Summary" for patients in Inpatient Rehabilitation Facilities with attached "Privacy Act [place] Records" was provided and verbally reviewed with: Patient and Family  Emergency Contact Information Contact Information    Name Relation Home Work [place] Spouse [phone]   Corky Mull Daughter [phone]  [phone]      Current Medical History  Patient Admitting Diagnosis: R BKA  History of Present Illness: Pt is a 80 y/o male admitted for 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  Pt took his eliquis the morning of his surgery, and postoperatively had hemorrhaghic shock requiring transfer to ICU for vasopressors.  Once stabilized returned to floor on room air.  Therapy evaluations were completed and pt was recommended for CIR.       [facility]'s [facility] record [facility] has been reviewed by the rehabilitation admission coordinator and physician.  Past Medical History  Past Medical History:  Diagnosis Date  . Anxiety   . 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 Cataract   . Chronic kidney disease    d/t DM  . CKD (chronic kidney disease), stage III ([place])   . Depression   . Diabetes mellitus    Vgo disposal insulin bolus  simular to insulin pump  . Dyspnea   . GERD (gastroesophageal reflux disease)   . History of kidney stones    passed  . Hyperlipidemia   . Hypertension   . Idiopathic pulmonary fibrosis ([place]) 11/2016  . ILD (interstitial lung disease) ([place])   . Moderate aortic stenosis    a. 10/2019 Echo: EF 55-60%, Gr2 DD. Nl RV.   Marland Kitchen Neuromuscular disorder ([place])   . Neuropathy associated with endocrine disorder ([place])   . Nonobstructive CAD (coronary artery disease)    a. 2012 Cath: mod, nonobs dzs; b. 10/2016 MV: EF 60%, no ischemia.  . OSA on CPAP 05/05/2017   Unattended Home Sleep Test 7/2/813-AHI 38.6/hour, desaturation to 64%, body weight 261 pounds  .  PONV (postoperative nausea and vomiting)   . Sleep apnea     uses cpap asked to bring mask and tubing    Family History   family history includes Diabetes Mellitus II in his mother; Emphysema (age of onset: 81) in his father; Heart attack in his father.  Prior Rehab/Hospitalizations Has the patient had prior rehab or hospitalizations prior to admission? Yes  Has the patient had major surgery during 100 days prior to admission? Yes   Current Medications  Current Facility-Administered Medications:  .  0.9 %  sodium chloride infusion, 250 mL, Intravenous, Continuous, Merlene Laughter F, NP .  acetaminophen (TYLENOL) tablet 325-650 mg, 325-650 mg, Oral, Q6H PRN, Persons, Bevely Palmer, [place], 650 mg at 03/25/21 1939 .  albuterol (PROVENTIL) (2.5 MG/3ML) 0.083% nebulizer solution 2.5 mg, 2.5 mg, Nebulization, Q6H PRN, Barb Merino, MD .  apixaban (ELIQUIS) tablet 5 mg,  5 mg, Oral, BID, Persons, Bevely Palmer, PA, 5 mg at 03/26/21 0846 .  ascorbic acid (VITAMIN C) tablet 1,000 mg, 1,000 mg, Oral, Daily, Persons, Bevely Palmer, PA, 1,000 mg at 03/26/21 0845 .  atorvastatin (LIPITOR) tablet 20 mg, 20 mg, Oral, BID, Persons, Bevely Palmer, PA, 20 mg at 03/26/21 0845 .  bisacodyl (DULCOLAX) EC tablet 5 mg, 5 mg, Oral, Daily PRN, Persons, Bevely Palmer, [place] .  Chlorhexidine Gluconate Cloth 2 % PADS 6 each, 6 each, Topical, Daily, [name], MD, 6 each at 03/26/21 [phone] .  diphenhydrAMINE (BENADRYL) capsule 25 mg, 25 mg, Oral, Q6H PRN, Shalhoub, Sherryll Burger, MD, 25 mg at 03/26/21 0003 .  docusate sodium (COLACE) capsule 100 mg, 100 mg, Oral, Daily, Persons, Bevely Palmer, PA, 100 mg at 03/26/21 0846 .  escitalopram (LEXAPRO) tablet 10 mg, 10 mg, Oral, Daily, Persons, Bevely Palmer, [place], 10 mg at 03/26/21 0845 .  feeding supplement (GLUCERNA SHAKE) (GLUCERNA SHAKE) liquid 237 mL, 237 mL, Oral, TID BM, Freddi Starr, MD, 237 mL at 03/26/21 0844 .  gabapentin (NEURONTIN) capsule 300 mg, 300 mg, Oral, TID, [name], MD, 300 mg at 03/26/21 0846 .  guaiFENesin-dextromethorphan (ROBITUSSIN DM) 100-10 MG/5ML syrup 15 mL, 15 mL, Oral, Q4H PRN, Persons, Bevely Palmer, PA .  hydrALAZINE (APRESOLINE) injection 5 mg, 5 mg, Intravenous, Q20 Min PRN, Persons, Bevely Palmer, PA .  insulin aspart (novoLOG) injection 0-15 Units, 0-15 Units, Subcutaneous, TID WC, Persons, Bevely Palmer, [place], 2 Units at 03/25/21 [phone] .  insulin aspart (novoLOG) injection 4 Units, 4 Units, Subcutaneous, TID WC, Barb Merino, MD, 4 Units at 03/25/21 1304 .  insulin glargine (LANTUS) injection 12 Units, 12 Units, Subcutaneous, QHS, Barb Merino, MD, 12 Units at 03/25/21 2145 .  iron polysaccharides (NIFEREX) capsule 150 mg, 150 mg, Oral, Q lunch, Persons, Bevely Palmer, [place], 150 mg at 03/25/21 1304 .  labetalol (NORMODYNE) injection 10 mg, 10 mg, Intravenous, Q10 min PRN, Persons, Bevely Palmer, [place] .  LORazepam (ATIVAN) tablet 1 mg, 1 mg,  Oral, QHS, Persons, Bevely Palmer, [place], 1 mg at 03/25/21 2145 .  magnesium citrate solution 1 Bottle, 1 Bottle, Oral, Once PRN, Persons, Bevely Palmer, PA .  magnesium oxide (MAG-OX) tablet 400 mg, 400 mg, Oral, Daily, [name], MD, 400 mg at 03/26/21 0845 .  MEDLINE mouth rinse, 15 mL, Mouth Rinse, q12n4p, Barb Merino, MD, 15 mL at 03/25/21 2147 .  metoprolol tartrate (LOPRESSOR) tablet 12.5 mg, 12.5 mg, Oral, Daily, Ghimire, Kuber, MD, 12.5 mg at 03/26/21 0845 .  multivitamin with minerals tablet 1 tablet, 1 tablet, Oral,  Daily, Persons, Bevely Palmer, [place], 1 tablet at 03/26/21 0845 .  nystatin (MYCOSTATIN/NYSTOP) topical powder, , Topical, BID, Shalhoub, Sherryll Burger, MD, Given at 03/26/21 [phone] .  ondansetron (ZOFRAN) injection 4 mg, 4 mg, Intravenous, Q6H PRN, Persons, Bevely Palmer, PA, 4 mg at 03/23/21 0405 .  pantoprazole (PROTONIX) EC tablet 40 mg, 40 mg, Oral, BID, Barb Merino, MD, 40 mg at 03/26/21 0845 .  phenol (CHLORASEPTIC) mouth spray 1 spray, 1 spray, Mouth/Throat, PRN, Persons, Bevely Palmer, PA .  polyethylene glycol (MIRALAX / GLYCOLAX) packet 17 g, 17 g, Oral, Daily PRN, Persons, Bevely Palmer, PA .  potassium chloride SA (KLOR-CON) CR tablet 20-40 mEq, 20-40 mEq, Oral, Daily PRN, Persons, Bevely Palmer, PA .  traMADol Veatrice Bourbon) tablet 50 mg, 50 mg, Oral, Q6H PRN, Barb Merino, MD .  zinc sulfate capsule 220 mg, 220 mg, Oral, Daily, Persons, Bevely Palmer, PA, 220 mg at 03/26/21 0845  Patients Current Diet:  Diet Order            Diet - low sodium heart healthy           Diet Carb Modified           Diet Carb Modified Fluid consistency: Thin; Room service appropriate? Yes  Diet effective now                 Precautions / Restrictions Precautions Precautions: Fall Precaution Comments: monitor BP Restrictions Weight Bearing Restrictions: Yes RLE Weight Bearing: Non weight bearing   Has the patient had 2 or more falls or a fall with injury in the past year? No  Prior Activity  Level Limited Community (1-2x/wk): was mostly using a scooter due to touble with RLE, able to transfer with RW and short distances with RW mod I, no driving  Prior Functional Level Self Care: Did the patient need help bathing, dressing, using the toilet or eating? Independent  Indoor Mobility: Did the patient need assistance with walking from room to room (with or without device)? Independent  Stairs: Did the patient need assistance with internal or external stairs (with or without device)? Independent  Functional Cognition: Did the patient need help planning regular tasks such as shopping or remembering to take medications? [place] / Equipment Home Assistive Devices/Equipment: CBG Meter,Other (Comment),Electric scooter,Wheelchair,Walker (specify type),Cane (specify quad or straight),Blood pressure cuff,CPAP,Grab bars around toilet,Grab bars in shower,Shower chair with back,Raised toilet seat with rails (continuous glucose monitor) Home Equipment: Walker - 4 wheels,Cane - single point,Cane - quad,Shower seat,Grab bars - tub/shower,Hand held shower head,Wheelchair - manual,Wheelchair - power  Prior Device Use: Indicate devices/aids used by the patient prior to current illness, exacerbation or injury? Manual wheelchair, Motorized wheelchair or scooter and Environmental consultant  Current Functional Level Cognition  Overall Cognitive Status: Within Functional Limits for tasks assessed Orientation Level: Oriented X4 General Comments: educated pt on grieving process associated with amputations    Extremity Assessment (includes Sensation/Coordination)  Upper Extremity Assessment: Generalized weakness (grossly 4-/5)  Lower Extremity Assessment: Defer to PT evaluation RLE Deficits / Details: NPWT to bulbous surgical site, issues with bleeding post op so did not MMT, AROM knee intact and able to peform hip flexion    ADLs  Overall ADL's : Needs assistance/impaired Eating/Feeding:  Set up,Sitting Grooming: Set up,Sitting Upper Body Bathing: Set up,Sitting Lower Body Bathing: Moderate assistance,Sit to/from stand,+2 for safety/equipment Upper Body Dressing : Set up,Sitting Lower Body Dressing: Moderate assistance,Sit to/from stand Lower Body Dressing Details (indicate cue type and reason): Close to  progressing to Min A, problem solved which LE to don first for easier task as pt difficulty reaching L LE. Pt required assist to don around L LE and to maintain balance in standing with RW. Educated on alternating hands and awareness of balance to decrease fall risk Toilet Transfer: Minimal Dealer Details (indicate cue type and reason): simulated to/from recliner as pt reports difficulty transferring back to bed after therapy sessions. BSC in room likely too small for pt - plan to coordinate to have larger BSC delivered to room Functional mobility during ADLs: Moderate assistance,Rolling walker,Minimal assistance (Young for complete transfer, minA for sit to stand and sliding L foot towards HPB) General ADL Comments: Collaborated on safety strategies, compensatory strategies for LB ADLs to decrease fall risk. Pt very engaged in education and demo good safety awareness. Discussed shower setup with OT suggesting pt have shower chair near lip of walk in shower to eliminate need to attempt hopping over lip    Mobility  Overal bed mobility: Modified Independent Bed Mobility: Supine to Sit,Sit to Supine Supine to sit: Modified independent (Device/Increase time),HOB elevated Sit to supine: Modified independent (Device/Increase time),HOB elevated General bed mobility comments: use of bedrails    Transfers  Overall transfer level: Needs assistance Equipment used: Rolling walker (2 wheeled),1 person hand held assist Transfers: Sit to/from Stand Sit to Stand: Min assist,From elevated surface Stand pivot transfers: Min assist Squat pivot transfers: Min  assist General transfer comment: Assist for balance to rise. Pt repeated x 6. Pt with placement of rt hand on walker and left hand pushing up from bed    Ambulation / Gait / Stairs / Wheelchair Mobility  Ambulation/Gait Ambulation/Gait assistance: Min assist,+2 safety/equipment Gait Distance (Feet): 3 Feet Assistive device: Rolling walker (2 wheeled) Gait Pattern/deviations:  (pivot on ball of foot, hop-to pattern) General Gait Details: Did not attempt with 1 person assist Gait velocity: slowed Gait velocity interpretation: <1.31 ft/sec, indicative of household ambulator    Posture / Balance Dynamic Sitting Balance Sitting balance - Comments: noted limitations with dynamic tasks Balance Overall balance assessment: Needs assistance Sitting-balance support: Feet supported,Single extremity supported Sitting balance-Leahy Scale: Fair Sitting balance - Comments: noted limitations with dynamic tasks Standing balance support: Bilateral upper extremity supported Standing balance-Leahy Scale: Poor Standing balance comment: walker and min guard for static standing. Stood x 6 for 45 sec to 2 minutes    Special needs/care consideration CPAP and Wound Vac yes   Previous Home Environment (from acute therapy documentation) Living Arrangements: Spouse/significant other Available Help at Discharge: Family,Available PRN/intermittently Type of Home: House Home Layout: Two level,Able to live on main level with bedroom/bathroom Home Access: Ramped entrance Bathroom Shower/Tub: Multimedia programmer: Handicapped height Bathroom Accessibility: Yes  Discharge Living Setting Plans for Discharge Living Setting: Patient's home,Lives with (comment) (wife) Type of Home at Discharge: House Discharge Home Layout: Able to live on main level with bedroom/bathroom Discharge Home Access: [address] entrance Discharge Bathroom Shower/Tub: Walk-in shower Discharge Bathroom Toilet: Handicapped  height Discharge Bathroom Accessibility: Yes How Accessible: Accessible via wheelchair Does the patient have any problems obtaining your medications?: No  Social/Family/Support Systems Patient Roles: Spouse Anticipated Caregiver: wife, [name], daughter Lattie Haw, and son Fritz Pickerel Anticipated Caregiver's Contact Information: [name] [phone] Ability/Limitations of Caregiver: n/a Caregiver Availability: 24/7 Discharge Plan Discussed with Primary Caregiver: Yes Is Caregiver In Agreement with Plan?: Yes Does Caregiver/Family have Issues with Lodging/Transportation while Pt is in Rehab?: No  Goals Patient/Family Goal for Rehab: PT/OT mod I  w/c level, supervision for ambulation Expected length of stay: 10-14 days Pt/Family Agrees to Admission and willing to participate: Yes Program Orientation Provided & Reviewed with Pt/Caregiver Including Roles  & Responsibilities: Yes  Barriers to Discharge: Insurance for SNF coverage  Decrease burden of Care through IP rehab admission: n/a Possible need for SNF placement upon discharge: not anticipated  Patient Condition: I have reviewed medical records from [facility], spoken with CM, and patient and spouse. I met with patient at the bedside for inpatient rehabilitation assessment.  Patient will benefit from ongoing PT and OT, can actively participate in 3 hours of therapy a day 5 days of the week, and can make measurable gains during the admission.  Patient will also benefit from the coordinated team approach during an Inpatient Acute Rehabilitation admission.  The patient will receive intensive therapy as well as Rehabilitation physician, nursing, social worker, and care management interventions.  Due to safety, skin/wound care, disease management, medication administration, pain management and patient education the patient requires 24 hour a day rehabilitation nursing.  The patient is currently min A with mobility and Mod A with  basic  ADLs.  Discharge setting and therapy post discharge at home with home health is anticipated.  Patient has agreed to participate in the Acute Inpatient Rehabilitation Program and will admit today.  Preadmission Screen Completed By: Shann Medal, PT, DPT with updates by  Bethel Born, 03/26/2021 10:33 AM ______________________________________________________________________   Discussed status with Dr. Posey Pronto on 03/26/21  at 10:33 AM and received approval for admission today.  Admission Coordinator: Shann Medal, PT, DPT with updates by Bethel Born, CCC-SLP, time 10:33 AM Sudie Grumbling 03/26/21    Assessment/Plan: Diagnosis:R BKA  1. Does the need for close, 24 hr/day Medical supervision in concert with the patient's rehab needs make it unreasonable for this patient to be served in a less intensive setting? Yes  2. 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   3. Due to bowel management, safety, skin/wound care, disease management, pain management and patient education, does the patient require 24 hr/day rehab nursing? Yes 4. Does the patient require coordinated care of a physician, rehab nurse, PT, OT to address physical and functional deficits in the context of the above medical diagnosis(es)? Yes Addressing deficits in the following areas: balance, endurance, locomotion, strength, transferring, bathing, dressing, toileting and psychosocial support 5. Can the patient actively participate in an intensive therapy program of at least 3 hrs of therapy 5 days a week? Yes 6. The potential for patient to make measurable gains while on inpatient rehab is excellent 7. Anticipated functional outcomes upon discharge from inpatient  rehab: modified independent and supervision PT, modified independent and supervision OT, n/a SLP 8. Estimated rehab length of stay to reach the above functional goals is: 8-12 days. 9. Anticipated discharge destination: Home 10. Overall Rehab/Functional Prognosis: good   MD Signature: Delice Lesch, MD, ABPMR

## 2021-03-25 NOTE — Progress Notes (Signed)
Inpatient Rehab Admissions Coordinator:   Met with patient at bedside to update.  I did receive a call from insurance today to let me know they had received expedited appeal and had all the necessary info.  I'm hopeful I will hear a determination from them by the end of the day today or early tomorrow.  We do have bed availability tomorrow and Saturday.   Shann Medal, PT, DPT Admissions Coordinator [phone] 03/25/21  11:49 AM

## 2021-03-25 NOTE — Progress Notes (Addendum)
Nutrition Follow-up  DOCUMENTATION CODES:   Obesity unspecified  INTERVENTION:    D/C Prosource Plus  Continue Glucerna Shake po TID, each supplement provides 220 kcal and 10 grams of protein.  Continue MVI with minerals daily  NUTRITION DIAGNOSIS:   Increased nutrient needs related to wound healing as evidenced by estimated needs.  Ongoing   GOAL:   Patient will meet greater than or equal to 90% of their needs  Progressing   MONITOR:   PO intake,Supplement acceptance,Labs,Weight trends,Skin  REASON FOR ASSESSMENT:   Malnutrition Screening Tool    ASSESSMENT:   80 year old male who presented on 4/13 for right BKA. 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 PMH of anxiety, CKD stage III, depression, DM, GERD, HLD, HTN, CAD s/p CABG, ILD, atrial fibrillation.  Patient reports good intake of meals. He c/o difficulty swallowing and only being able to eat around half of lunch and dinner meals. He ate 100% of breakfast today. Meal intakes documented at 90-100%. He does not eat a lot of meats because they are too dry and he has trouble swallowing them. He drinks the Glucerna Shakes 2-3 times per day, whenever he receives them. He does not like the Prosource Plus supplement, it made him nauseous after he took it.   Labs reviewed.  CBG: 136-116  Medications reviewed and include vitamin C, Colace, Novolog SSI + 4 Units TID with meals, Lantus, Niferex, Mag-Ox, MVI with minerals, zinc sulfate.  Wound VAC removed 4/20. Patient is awaiting insurance authorization for rehab admission.   Admission weight 108 kg, currently 113.1 kg.  Diet Order:   Diet Order            Diet Carb Modified Fluid consistency: Thin; Room service appropriate? Yes  Diet effective now                 EDUCATION NEEDS:   Education needs have been addressed  Skin:  Skin Assessment: Skin Integrity Issues: Skin Integrity Issues:: Incisions Incisions: R BKA  site  Last BM:  03/17/21  Height:   Ht Readings from Last 1 Encounters:  03/18/21 6' (1.829 m)    Weight:   Wt Readings from Last 1 Encounters:  03/21/21 113.1 kg    Ideal Body Weight:     BMI:  Body mass index is 33.82 kg/m.  Estimated Nutritional Needs:   Kcal:  2200-2400  Protein:  115-135 grams  Fluid:  >/= 2.0 L    Lucas Mallow, RD, LDN, CNSC Please refer to Amion for contact information.

## 2021-03-25 NOTE — Progress Notes (Signed)
PROGRESS NOTE    Shane [name]  MIW:[id number] DOB: 24-Nov-1941 DOA: 03/17/2021 PCP: Burnard Bunting, MD    Brief Narrative:  80 year old gentleman with history of chronic A. 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 Apparently patient took his Eliquis before surgery.  4/18, stabilized and waiting for rehab. 4/19, insurance declined request for CIR.  Peer to peer completed with [facility].  Declined stating that patient will need longer subacute rehab. Given patient's excellent rehab potential and controlled medical issues, he is very strong candidate to do aggressive rehab.  I recommend acute inpatient rehab.   Assessment & Plan:   Active Problems:   Abscess of bursa of right ankle   Atherosclerosis of native arteries of extremities with gangrene, right leg (HCC)   Postoperative hemorrhagic shock  Hemorrhagic shock secondary to excessive blood loss status post elective below-knee amputation in the setting of chronic anticoagulation therapy: Also exacerbated by use of antihypertensives. Hemoglobin dropped from 9.4-6 postprocedure. Total 3 units of PRBC transfusion.  Stabilized.  Transiently on Levophed. Hemoglobin has remained more than 8 and appropriately responded. Wound VAC removed today 4/20. Postop wound care instructions as per surgery. Eliquis resumed.  Will check hemoglobin tomorrow.  Chronic A. fib anticoagulated on Eliquis: His heart rate is acceptable.  Holding all antihypertensives and Eliquis.  Low-dose beta-blockers.  Eliquis resumed.  No evidence of ongoing bleeding.  History of type II heart  block requiring pacemaker and removal due to infection.  Stable as per cardiology.  Hypertension: Holding all antihypertensives.  His heart rate is acceptable, started metoprolol in low doses. We will continue to hold antihypertensives until rehab and follow-up.  Idiopathic pulmonary fibrosis: Currently on surveillance with pulmonary as outpatient. Repeat chest x-ray is stable.  Complains of exertional dyspnea.  Will use bronchodilator therapy and chest physiotherapy with incentive spirometry.  Obstructive sleep apnea: Using CPAP at night.  Type 2 diabetes: On Farxiga and mealtime insulin at home.  Added long-acting insulin along with prandial insulin.  Will increase dose today.  Lantus 12 units at night, NovoLog 4 units with meals.  CKD stage IIIb: Baseline creatinine about 1.6.  Remains at about baseline.  Recheck tomorrow.  Hyperkalemia: Improved.  Recheck tomorrow.  Patient is medically stable.  He is very appropriate candidate for aggressive acute inpatient rehab.    DVT prophylaxis: SCD's Start: 03/17/21 1519, Eliquis.   Code Status: Full code Family Communication: None today. Disposition Plan: Status is: Inpatient  Remains inpatient appropriate because:IV treatments appropriate due to intensity of illness or inability to take PO and Inpatient level of care appropriate due to severity of illness   Dispo: The patient is from: Home              Anticipated d/c is to: Acute inpatient rehab.              Patient currently is medically stable.   Difficult to place patient No         Consultants:   PCCM  Orthopedics  Procedures:   Right BKA 4/13  Antimicrobials:   None   Subjective: Patient seen and examined.  Trying to work with occupational therapy.  Very motivated.  Coughing  on deep breathing and complaining of acid symptoms mostly retrosternal burning. Will increase dose of Protonix to 40 mg twice a day with significant symptoms. Waiting to go to  rehab.   Objective: Vitals:   03/24/21 2000 03/24/21 2220 03/25/21 0400 03/25/21 0833  BP: (!) 106/59  (!) 99/59 110/60  Pulse: 60  60 60  Resp: 19  16   Temp: 97.6 F (36.4 C)  97.7 F (36.5 C)   TempSrc: Oral  Oral   SpO2: 100% 100% 97%   Weight:      Height:        Intake/Output Summary (Last 24 hours) at 03/25/2021 1325 Last data filed at 03/25/2021 0957 Gross per 24 hour  Intake 717 ml  Output 1225 ml  Net -508 ml   Filed Weights   03/18/21 1655 03/20/21 0415 03/21/21 0400  Weight: 106.8 kg 104.8 kg 113.1 kg    Examination:  General: Patient appears comfortable. Not in any distress. Cardiovascular: S1-S2 normal.  Irregularly irregular. Respiratory: Good air entry bilateral.  He does have fine crackles at both bases consistent with his interstitial disease. Gastrointestinal: Soft and nontender.  Bowel sounds present. Ext: Right BKA stump on compression dressing wound VAC removed.   Neuro: No focal deficit.   Data Reviewed: I have personally reviewed following labs and imaging studies  CBC: Recent Labs  Lab 03/18/21 1652 03/19/21 0043 03/20/21 0046 03/21/21 0312 03/22/21 0828  WBC 13.1* 13.4* 10.0 10.0  --   NEUTROABS  --   --   --  6.1  --   HGB 8.5* 8.0* 7.3* 8.5* 8.6*  HCT 26.1* 25.2* 22.5* 26.5* 26.5*  MCV 88.2 87.8 87.2 89.2  --   PLT 289 279 207 213  --    Basic Metabolic Panel: Recent Labs  Lab 03/18/21 1652 03/19/21 0043 03/20/21 0046 03/21/21 0312  NA 130* 128* 130* 129*  K 5.5* 4.9 4.5 4.5  CL 95* 94* 94* 94*  CO2 26 27 27 28   GLUCOSE 250* 191* 168* 152*  BUN 43* 44* 35* 31*  CREATININE 1.78* 1.85* 1.57* 1.35*  CALCIUM 8.0* 8.0* 8.0* 8.0*  MG  --   --   --  2.0   GFR: Estimated Creatinine Clearance: 57.6 mL/min (A) (by C-G formula based on SCr of 1.35 mg/dL (H)). Liver Function Tests: No results for input(s): AST, ALT, ALKPHOS, BILITOT, PROT, ALBUMIN in the last 168 hours. No results for input(s): LIPASE, AMYLASE in the last 168  hours. No results for input(s): AMMONIA in the last 168 hours. Coagulation Profile: No results for input(s): INR, PROTIME in the last 168 hours. Cardiac Enzymes: No results for input(s): CKTOTAL, CKMB, CKMBINDEX, TROPONINI in the last 168 hours. BNP (last 3 results) No results for input(s): PROBNP in the last 8760 hours. HbA1C: No results for input(s): HGBA1C in the last 72 hours. CBG: Recent Labs  Lab 03/24/21 1141 03/24/21 1724 03/24/21 2049 03/25/21 0804 03/25/21 1146  GLUCAP 180* 168* 162* 136* 116*   Lipid Profile: No results for input(s): CHOL, HDL, LDLCALC, TRIG, CHOLHDL, LDLDIRECT in the last 72 hours. Thyroid Function Tests: No results for input(s): TSH, T4TOTAL, FREET4, T3FREE, THYROIDAB in the last 72 hours. Anemia Panel: No results for input(s): VITAMINB12, FOLATE, FERRITIN, TIBC, IRON, RETICCTPCT in the last 72 hours. Sepsis Labs: No results for input(s): PROCALCITON, LATICACIDVEN in the last 168 hours.  Recent Results (from the past 240 hour(s))  SARS CORONAVIRUS 2 (TAT 6-24 HRS) Nasopharyngeal Nasopharyngeal Swab  Status: None   Collection Time: 03/16/21  1:32 PM   Specimen: Nasopharyngeal Swab  Result Value Ref Range Status   SARS Coronavirus 2 NEGATIVE NEGATIVE Final    Comment: (NOTE) SARS-CoV-2 target nucleic acids are NOT DETECTED.  The SARS-CoV-2 RNA is generally detectable in upper and lower respiratory specimens during the acute phase of infection. Negative results do not preclude SARS-CoV-2 infection, do not rule out co-infections with other pathogens, and should not be used as the sole basis for treatment or other patient management decisions. Negative results must be combined with clinical observations, patient history, and epidemiological information. The expected result is Negative.  Fact Sheet for Patients: SugarRoll.be  Fact Sheet for Healthcare Providers: https://www.woods-mathews.com/  This  test is not yet approved or cleared by the [place] FDA and  has been authorized for detection and/or diagnosis of SARS-CoV-2 by FDA under an Emergency Use Authorization (EUA). This EUA will remain  in effect (meaning this test can be used) for the duration of the COVID-19 declaration under Se ction 564(b)(1) of the Act, 21 U.S.C. section 360bbb-3(b)(1), unless the authorization is terminated or revoked sooner.  Performed at [facility], [place] [address], [place], [place] [address]   MRSA PCR Screening     Status: None   Collection Time: 03/18/21  5:04 PM   Specimen: Nasopharyngeal  Result Value Ref Range Status   MRSA by PCR NEGATIVE NEGATIVE Final    Comment:        The GeneXpert MRSA Assay (FDA approved for NASAL specimens only), is one component of a comprehensive MRSA colonization surveillance program. It is not intended to diagnose MRSA infection nor to guide or monitor treatment for MRSA infections. Performed at [facility], [place] [address], [place], [place] [address]          Radiology Studies: DG CHEST PORT 1 VIEW  Result Date: 03/24/2021 CLINICAL DATA:  Shortness of breath EXAM: PORTABLE CHEST 1 VIEW COMPARISON:  12/16/2020 FINDINGS: Cardiac shadow is enlarged but stable. Postsurgical changes are noted. Diffuse vascular congestion is again noted likely a portion of which is chronic in nature. Diffuse fibrotic changes are noted worse on the left than the right. No acute bony abnormality is seen. Postsurgical changes in the cervical spine are noted. IMPRESSION: Chronic appearing vascular congestion and fibrotic changes similar to that seen on the prior study. No acute abnormality noted. Electronically Signed   By: Inez Catalina M.D.   On: 03/24/2021 18:49        Scheduled Meds: . (feeding supplement) PROSource Plus  30 mL Oral BID BM  . apixaban  5 mg Oral BID  . vitamin C  1,000 mg Oral Daily  . atorvastatin  20 mg Oral BID  . Chlorhexidine  Gluconate Cloth  6 each Topical Daily  . docusate sodium  100 mg Oral Daily  . escitalopram  10 mg Oral Daily  . feeding supplement (GLUCERNA SHAKE)  237 mL Oral TID BM  . gabapentin  300 mg Oral TID  . insulin aspart  0-15 Units Subcutaneous TID WC  . insulin aspart  4 Units Subcutaneous TID WC  . insulin glargine  12 Units Subcutaneous QHS  . iron polysaccharides  150 mg Oral Q lunch  . LORazepam  1 mg Oral QHS  . magnesium oxide  400 mg Oral Daily  . mouth rinse  15 mL Mouth Rinse q12n4p  . metoprolol tartrate  12.5 mg Oral Daily  . multivitamin with minerals  1 tablet Oral Daily  . nystatin   Topical BID  . pantoprazole  40 mg Oral BID  . zinc sulfate  220 mg Oral Daily   Continuous Infusions: . sodium chloride       LOS: 8 days    Time spent: 30 minutes    Barb Merino, MD [facility] Pager [phone]

## 2021-03-25 NOTE — Care Management Important Message (Signed)
Important Message  Patient Details  Name: Shane [name] MRN: [id number] Date of Birth: [birth year]     Medicare Important Message Given:  Yes - Important Message mailed due to current National Emergency  Verbal consent obtained due to current National Emergency    Contact Name: Jurell Call Date: 03/25/21  Time: 1436 Phone: [phone] Outcome: No Answer/Busy Important Message mailed to: Patient address on file    [name] 03/25/2021, 2:36 PM

## 2021-03-25 NOTE — Progress Notes (Signed)
Inpatient Rehab Admissions Coordinator:   Expedited appeal successful and pt is approved for CIR. I do not have a bed available for him to admit today but am hopeful for tomorrow versus Saturday.  My colleague Gayland Curry will f/u tomorrow.    Shann Medal, PT, DPT Admissions Coordinator [phone] 03/25/21  5:12 PM

## 2021-03-25 NOTE — Progress Notes (Signed)
Occupational Therapy Treatment Patient Details Name: Shane [name] MRN: [id number] DOB: 10-Oct-1941 Today's Date: 03/25/2021    History of present illness 80 year old male admitted on 4/13 for BKA due to non-healing wound. He was noted to take his eliquis the morning of the procedure and has been transferred to the ICU postoperatively for hypotension related to blood loss. He has received 2 units of PRBCs and was started on peripheral levophed. PMHx: idiopathic pulmonary fibrosis, CKD III, atrial fibrillation, OSA on CPAP, CAD, DM II, and type II AV block with pacemaker placement 03/2020 s/p removal due to infection, recently had I&D of Rt ankle on 02/19/21   OT comments  Pt making excellent progress towards OT goals, remains motivated to participate with therapy and achieve independence. Pt reports questions on strategies for transferring back to bed safely. Instructed pt in squat pivot transfers with Min A for safely guiding hips to surface. Pt with good safety awareness and able to identify possible fall risk barriers. Guided pt in LB dressing tasks with Mod A required to don around L foot and maintain balance in standing with RW. Educated on alternating hand support for safely donning pants around waist. Assisted in repositioning shrinker and educating pt on donning limb protector with pt verbalizing understanding. Collaborated with pt on plan to obtain larger [facility] for maximizing independence with toileting tasks during next session. Plan to educate on UE HEP to maximize strength for ADL transfers. Continue to recommend CIR for comprehensive therapies to maximize ADLs/mobility safety and decrease fall risk.    Follow Up Recommendations  CIR    Equipment Recommendations  3 in 1 bedside commode    Recommendations for Other Services      Precautions / Restrictions Precautions Precautions: Fall Precaution Comments: monitor BP Restrictions Weight Bearing Restrictions: Yes RLE Weight Bearing:  Non weight bearing       Mobility Bed Mobility Overal bed mobility: Modified Independent Bed Mobility: Supine to Sit;Sit to Supine           General bed mobility comments: use of bedrails    Transfers Overall transfer level: Needs assistance Equipment used: Rolling walker (2 wheeled);1 person hand held assist Transfers: Sit to/from [employer] Sit to Stand: Min assist;From elevated surface   Squat pivot transfers: Min assist     General transfer comment: Min A for steadying in sit to stand with RW for LB dressing tasks. Guided pt in squat pivot practice at [place] A for cues for sequencing. pt able to assist in problem solving, identifying barriers    Balance Overall balance assessment: Needs assistance Sitting-balance support: Feet supported;Single extremity supported Sitting balance-Leahy Scale: Fair Sitting balance - Comments: noted limitations with dynamic tasks   Standing balance support: Bilateral upper extremity supported Standing balance-Leahy Scale: Poor Standing balance comment: reliant on at least one UE support in standing                           ADL either performed or assessed with clinical judgement   ADL Overall ADL's : Needs assistance/impaired                     Lower Body Dressing: Moderate assistance;Sit to/from stand Lower Body Dressing Details (indicate cue type and reason): Close to progressing to Min A, problem solved which LE to don first for easier task as pt difficulty reaching L LE. Pt required assist to don around L LE and to  maintain balance in standing with RW. Educated on alternating hands and awareness of balance to decrease fall risk Toilet Transfer: Minimal Public house manager Details (indicate cue type and reason): simulated to/from recliner as pt reports difficulty transferring back to bed after therapy sessions. BSC in room likely too small for pt - plan to coordinate to have  larger BSC delivered to room           General ADL Comments: Collaborated on safety strategies, compensatory strategies for LB ADLs to decrease fall risk. Pt very engaged in education and demo good safety awareness. Discussed shower setup with OT suggesting pt have shower chair near lip of walk in shower to eliminate need to attempt hopping over lip     Vision   Vision Assessment?: No apparent visual deficits   Perception     Praxis      Cognition Arousal/Alertness: Awake/alert Behavior During Therapy: WFL for tasks assessed/performed Overall Cognitive Status: Within Functional Limits for tasks assessed                                          Exercises     Shoulder Instructions       General Comments VSS on RA. Assisted in adjusting shrinker for better fit and less slack at tip of residual limb. Educated pt on limb protector donning and purpose.    Pertinent Vitals/ Pain       Pain Assessment: No/denies pain  Home Living                                          Prior Functioning/Environment              Frequency  Min 2X/week        Progress Toward Goals  OT Goals(current goals can now be found in the care plan section)  Progress towards OT goals: Progressing toward goals  Acute Rehab OT Goals Patient Stated Goal: would eventually like to have prosthesis OT Goal Formulation: With patient Time For Goal Achievement: 04/02/21 Potential to Achieve Goals: Good ADL Goals Pt Will Perform Lower Body Dressing: sit to/from stand;with min guard assist Pt Will Transfer to Toilet: with min guard assist;stand pivot transfer Additional ADL Goal #1: Pt will demonstrate independence with desensitization strategies.  Plan Discharge plan remains appropriate    Co-evaluation                 AM-PAC OT "6 Clicks" Daily Activity     Outcome Measure   Help from another person eating meals?: None Help from another person  taking care of personal grooming?: A Little Help from another person toileting, which includes using toliet, bedpan, or urinal?: A Lot Help from another person bathing (including washing, rinsing, drying)?: A Lot Help from another person to put on and taking off regular upper body clothing?: A Little Help from another person to put on and taking off regular lower body clothing?: A Lot 6 Click Score: 16    End of Session Equipment Utilized During Treatment: Gait belt;Rolling walker  OT Visit Diagnosis: Unsteadiness on feet (R26.81);Pain   Activity Tolerance Patient tolerated treatment well   Patient Left in bed;with call bell/phone within reach;with bed alarm set   Nurse Communication Mobility status;Other (comment) (need for larger BSC)  Time: 6047-9987 OT Time Calculation (min): 42 min  Charges: OT General Charges $OT Visit: 1 Visit OT Treatments $Self Care/Home Management : 23-37 mins $Therapeutic Activity: 8-22 mins  Malachy Chamber, OTR/L Acute Rehab Services Office: [phone]   Layla Maw 03/25/2021, 9:52 AM

## 2021-03-25 NOTE — Progress Notes (Signed)
Physical Therapy Treatment Patient Details Name: Shane [name] MRN: [id number] DOB: April 01, 1941 Today's Date: 03/25/2021    History of Present Illness 80 year old male admitted on 4/13 for BKA due to non-healing wound. He was noted to take his eliquis the morning of the procedure and has been transferred to the ICU postoperatively for hypotension related to blood loss. He has received 2 units of PRBCs and was started on peripheral levophed. PMHx: idiopathic pulmonary fibrosis, CKD III, atrial fibrillation, OSA on CPAP, CAD, DM II, and type II AV block with pacemaker placement 03/2020 s/p removal due to infection, recently had I&D of Rt ankle on 02/19/21    PT Comments    Pt continues to make steady progress. Pt able to stand x 6 with walker today for up to 2 minutes to build strength, endurance and balance. Pt remains highly motivated to work toward independence. Feel pt is excellent candidate for CIR.    Follow Up Recommendations  CIR     Equipment Recommendations  None recommended by PT    Recommendations for Other Services       Precautions / Restrictions Precautions Precautions: Fall Precaution Comments: monitor BP    Mobility  Bed Mobility Overal bed mobility: Modified Independent Bed Mobility: Supine to Sit;Sit to Supine     Supine to sit: Modified independent (Device/Increase time);HOB elevated Sit to supine: Modified independent (Device/Increase time);HOB elevated   General bed mobility comments: use of bedrails    Transfers Overall transfer level: Needs assistance Equipment used: Rolling walker (2 wheeled);1 person hand held assist Transfers: Sit to/from Stand Sit to Stand: Min assist;From elevated surface         General transfer comment: Assist for balance to rise. Pt repeated x 6. Pt with placement of rt hand on walker and left hand pushing up from bed  Ambulation/Gait             General Gait Details: Did not attempt with 1 person  assist   Stairs             Wheelchair Mobility    Modified Rankin (Stroke Patients Only)       Balance Overall balance assessment: Needs assistance Sitting-balance support: Feet supported;Single extremity supported Sitting balance-Leahy Scale: Fair     Standing balance support: Bilateral upper extremity supported Standing balance-Leahy Scale: Poor Standing balance comment: walker and min guard for static standing. Stood x 6 for 45 sec to 2 minutes                            Cognition Arousal/Alertness: Awake/alert Behavior During Therapy: WFL for tasks assessed/performed Overall Cognitive Status: Within Functional Limits for tasks assessed                                        Exercises Amputee Exercises Quad Sets: Strengthening;Right;10 reps;Supine Hip Extension: AROM;Right;10 reps;Sidelying Hip ABduction/ADduction: AAROM;Right;10 reps;Supine Hip Flexion/Marching: AROM;Both;10 reps;Supine Straight Leg Raises: Strengthening;Right;10 reps;Supine    General Comments General comments (skin integrity, edema, etc.): vss      Pertinent Vitals/Pain      Home Living                      Prior Function            PT Goals (current goals can now be found in the  care plan section) Acute Rehab PT Goals Patient Stated Goal: would eventually like to have prosthesis Progress towards PT goals: Progressing toward goals    Frequency    Min 3X/week      PT Plan Current plan remains appropriate;Frequency needs to be updated    Co-evaluation              AM-PAC PT "6 Clicks" Mobility   Outcome Measure  Help needed turning from your back to your side while in a flat bed without using bedrails?: None Help needed moving from lying on your back to sitting on the side of a flat bed without using bedrails?: None Help needed moving to and from a bed to a chair (including a wheelchair)?: A Little Help needed standing up  from a chair using your arms (e.g., wheelchair or bedside chair)?: A Little Help needed to walk in hospital room?: Total Help needed climbing 3-5 steps with a railing? : Total 6 Click Score: 16    End of Session Equipment Utilized During Treatment: Gait belt Activity Tolerance: Patient tolerated treatment well Patient left: in bed;with call bell/phone within reach;with bed alarm set;with family/visitor present   PT Visit Diagnosis: Other abnormalities of gait and mobility (R26.89)     Time: 9702-6378 PT Time Calculation (min) (ACUTE ONLY): 29 min  Charges:  $Therapeutic Exercise: 8-22 mins $Therapeutic Activity: 8-22 mins                     [place] Pager [phone] Office [place] 03/25/2021, 4:31 PM

## 2021-03-26 ENCOUNTER — Other Ambulatory Visit: Payer: Self-pay

## 2021-03-26 ENCOUNTER — Ambulatory Visit: Payer: Medicare Other | Admitting: Internal Medicine

## 2021-03-26 ENCOUNTER — Encounter (HOSPITAL_COMMUNITY): Payer: Self-pay | Admitting: Physical Medicine & Rehabilitation

## 2021-03-26 ENCOUNTER — Inpatient Hospital Stay (HOSPITAL_COMMUNITY)
Admission: RE | Admit: 2021-03-26 | Discharge: 2021-04-13 | DRG: 560 | Disposition: A | Discharge status: Home Health | Payer: Medicare Other | Source: Intra-hospital | Attending: Physical Medicine & Rehabilitation | Admitting: Physical Medicine & Rehabilitation

## 2021-03-26 DIAGNOSIS — G8918 Other acute postprocedural pain: Secondary | ICD-10-CM

## 2021-03-26 DIAGNOSIS — J8410 Pulmonary fibrosis, unspecified: Secondary | ICD-10-CM | POA: Diagnosis not present

## 2021-03-26 DIAGNOSIS — Z8616 Personal history of COVID-19: Secondary | ICD-10-CM

## 2021-03-26 DIAGNOSIS — I129 Hypertensive chronic kidney disease with stage 1 through stage 4 chronic kidney disease, or unspecified chronic kidney disease: Secondary | ICD-10-CM | POA: Diagnosis present

## 2021-03-26 DIAGNOSIS — I480 Paroxysmal atrial fibrillation: Secondary | ICD-10-CM

## 2021-03-26 DIAGNOSIS — T8119XD Other postprocedural shock, subsequent encounter: Secondary | ICD-10-CM

## 2021-03-26 DIAGNOSIS — Z89511 Acquired absence of right leg below knee: Secondary | ICD-10-CM

## 2021-03-26 DIAGNOSIS — E871 Hypo-osmolality and hyponatremia: Secondary | ICD-10-CM | POA: Diagnosis present

## 2021-03-26 DIAGNOSIS — D62 Acute posthemorrhagic anemia: Secondary | ICD-10-CM | POA: Diagnosis present

## 2021-03-26 DIAGNOSIS — E1169 Type 2 diabetes mellitus with other specified complication: Secondary | ICD-10-CM | POA: Diagnosis not present

## 2021-03-26 DIAGNOSIS — I130 Hypertensive heart and chronic kidney disease with heart failure and stage 1 through stage 4 chronic kidney disease, or unspecified chronic kidney disease: Secondary | ICD-10-CM | POA: Diagnosis present

## 2021-03-26 DIAGNOSIS — I5032 Chronic diastolic (congestive) heart failure: Secondary | ICD-10-CM | POA: Diagnosis present

## 2021-03-26 DIAGNOSIS — Z7982 Long term (current) use of aspirin: Secondary | ICD-10-CM

## 2021-03-26 DIAGNOSIS — E669 Obesity, unspecified: Secondary | ICD-10-CM | POA: Diagnosis not present

## 2021-03-26 DIAGNOSIS — Z833 Family history of diabetes mellitus: Secondary | ICD-10-CM

## 2021-03-26 DIAGNOSIS — Z951 Presence of aortocoronary bypass graft: Secondary | ICD-10-CM

## 2021-03-26 DIAGNOSIS — Z7901 Long term (current) use of anticoagulants: Secondary | ICD-10-CM

## 2021-03-26 DIAGNOSIS — Z9641 Presence of insulin pump (external) (internal): Secondary | ICD-10-CM

## 2021-03-26 DIAGNOSIS — N1831 Chronic kidney disease, stage 3a: Secondary | ICD-10-CM | POA: Diagnosis present

## 2021-03-26 DIAGNOSIS — E1122 Type 2 diabetes mellitus with diabetic chronic kidney disease: Secondary | ICD-10-CM | POA: Diagnosis present

## 2021-03-26 DIAGNOSIS — I2510 Atherosclerotic heart disease of native coronary artery without angina pectoris: Secondary | ICD-10-CM | POA: Diagnosis present

## 2021-03-26 DIAGNOSIS — I482 Chronic atrial fibrillation, unspecified: Secondary | ICD-10-CM | POA: Diagnosis present

## 2021-03-26 DIAGNOSIS — I959 Hypotension, unspecified: Secondary | ICD-10-CM | POA: Diagnosis present

## 2021-03-26 DIAGNOSIS — Z794 Long term (current) use of insulin: Secondary | ICD-10-CM

## 2021-03-26 DIAGNOSIS — F419 Anxiety disorder, unspecified: Secondary | ICD-10-CM | POA: Diagnosis present

## 2021-03-26 DIAGNOSIS — K219 Gastro-esophageal reflux disease without esophagitis: Secondary | ICD-10-CM | POA: Diagnosis present

## 2021-03-26 DIAGNOSIS — N183 Chronic kidney disease, stage 3 unspecified: Secondary | ICD-10-CM | POA: Diagnosis present

## 2021-03-26 DIAGNOSIS — E1151 Type 2 diabetes mellitus with diabetic peripheral angiopathy without gangrene: Secondary | ICD-10-CM | POA: Diagnosis present

## 2021-03-26 DIAGNOSIS — E2740 Unspecified adrenocortical insufficiency: Secondary | ICD-10-CM | POA: Diagnosis present

## 2021-03-26 DIAGNOSIS — I441 Atrioventricular block, second degree: Secondary | ICD-10-CM | POA: Diagnosis present

## 2021-03-26 DIAGNOSIS — E1165 Type 2 diabetes mellitus with hyperglycemia: Secondary | ICD-10-CM | POA: Diagnosis present

## 2021-03-26 DIAGNOSIS — E11621 Type 2 diabetes mellitus with foot ulcer: Secondary | ICD-10-CM | POA: Diagnosis present

## 2021-03-26 DIAGNOSIS — R0602 Shortness of breath: Secondary | ICD-10-CM

## 2021-03-26 DIAGNOSIS — L97525 Non-pressure chronic ulcer of other part of left foot with muscle involvement without evidence of necrosis: Secondary | ICD-10-CM | POA: Diagnosis present

## 2021-03-26 DIAGNOSIS — E113592 Type 2 diabetes mellitus with proliferative diabetic retinopathy without macular edema, left eye: Secondary | ICD-10-CM | POA: Diagnosis present

## 2021-03-26 DIAGNOSIS — I4891 Unspecified atrial fibrillation: Secondary | ICD-10-CM

## 2021-03-26 DIAGNOSIS — E785 Hyperlipidemia, unspecified: Secondary | ICD-10-CM | POA: Diagnosis present

## 2021-03-26 DIAGNOSIS — Z79899 Other long term (current) drug therapy: Secondary | ICD-10-CM

## 2021-03-26 DIAGNOSIS — J84112 Idiopathic pulmonary fibrosis: Secondary | ICD-10-CM | POA: Diagnosis present

## 2021-03-26 DIAGNOSIS — G546 Phantom limb syndrome with pain: Secondary | ICD-10-CM | POA: Diagnosis present

## 2021-03-26 DIAGNOSIS — N182 Chronic kidney disease, stage 2 (mild): Secondary | ICD-10-CM

## 2021-03-26 DIAGNOSIS — E113591 Type 2 diabetes mellitus with proliferative diabetic retinopathy without macular edema, right eye: Secondary | ICD-10-CM | POA: Diagnosis present

## 2021-03-26 DIAGNOSIS — Z4781 Encounter for orthopedic aftercare following surgical amputation: Principal | ICD-10-CM

## 2021-03-26 DIAGNOSIS — R7309 Other abnormal glucose: Secondary | ICD-10-CM

## 2021-03-26 DIAGNOSIS — I459 Conduction disorder, unspecified: Secondary | ICD-10-CM

## 2021-03-26 DIAGNOSIS — I10 Essential (primary) hypertension: Secondary | ICD-10-CM | POA: Diagnosis not present

## 2021-03-26 DIAGNOSIS — E118 Type 2 diabetes mellitus with unspecified complications: Secondary | ICD-10-CM

## 2021-03-26 DIAGNOSIS — L89890 Pressure ulcer of other site, unstageable: Secondary | ICD-10-CM | POA: Diagnosis present

## 2021-03-26 LAB — GLUCOSE, CAPILLARY
Glucose-Capillary: 116 mg/dL — ABNORMAL HIGH (ref 70–99)
Glucose-Capillary: 151 mg/dL — ABNORMAL HIGH (ref 70–99)
Glucose-Capillary: 153 mg/dL — ABNORMAL HIGH (ref 70–99)
Glucose-Capillary: 110 mg/dL — ABNORMAL HIGH (ref 70–99)

## 2021-03-26 LAB — BASIC METABOLIC PANEL
Anion gap: 5 (ref 5–15)
BUN: 22 mg/dL (ref 8–23)
CO2: 28 mmol/L (ref 22–32)
Calcium: 8.1 mg/dL — ABNORMAL LOW (ref 8.9–10.3)
Chloride: 98 mmol/L (ref 98–111)
Creatinine, Ser: 1.12 mg/dL (ref 0.61–1.24)
GFR, Estimated: >60 mL/min (ref >60)
Glucose, Bld: 117 mg/dL — ABNORMAL HIGH (ref 70–99)
Potassium: 5 mmol/L (ref 3.5–5.1)
Sodium: 131 mmol/L — ABNORMAL LOW (ref 135–145)

## 2021-03-26 LAB — CBC WITH DIFFERENTIAL/PLATELET
Abs Immature Granulocytes: 0.03 10*3/uL (ref 0.00–0.07)
Basophils Absolute: 0.1 10*3/uL (ref 0.0–0.1)
Basophils Relative: 1 %
Eosinophils Absolute: 0.8 10*3/uL — ABNORMAL HIGH (ref 0.0–0.5)
Eosinophils Relative: 10 %
HCT: 25.4 % — ABNORMAL LOW (ref 39.0–52.0)
Hemoglobin: 8 g/dL — ABNORMAL LOW (ref 13.0–17.0)
Immature Granulocytes: 0 %
Lymphocytes Relative: 25 %
Lymphs Abs: 2 10*3/uL (ref 0.7–4.0)
MCH: 28.1 pg (ref 26.0–34.0)
MCHC: 31.5 g/dL (ref 30.0–36.0)
MCV: 89.1 fL (ref 80.0–100.0)
Monocytes Absolute: 0.8 10*3/uL (ref 0.1–1.0)
Monocytes Relative: 11 %
Neutro Abs: 4.2 10*3/uL (ref 1.7–7.7)
Neutrophils Relative %: 53 %
Platelets: 292 10*3/uL (ref 150–400)
RBC: 2.85 MIL/uL — ABNORMAL LOW (ref 4.22–5.81)
RDW: 14.6 % (ref 11.5–15.5)
WBC: 7.9 10*3/uL (ref 4.0–10.5)
nRBC: 0 % (ref 0.0–0.2)

## 2021-03-26 LAB — PHOSPHORUS: Phosphorus: 3.3 mg/dL (ref 2.5–4.6)

## 2021-03-26 LAB — MAGNESIUM: Magnesium: 1.9 mg/dL (ref 1.7–2.4)

## 2021-03-26 MED ORDER — LORAZEPAM 1 MG PO TABS
1.0000 mg | ORAL_TABLET | Freq: Every day | ORAL | Status: DC
  Administered 2021-03-26 – 2021-04-12 (×18): 1 mg via ORAL
  Filled 2021-03-26 (×3): qty 1
  Filled 2021-03-26: qty 2
  Filled 2021-03-26 (×3): qty 1
  Filled 2021-03-26: qty 2
  Filled 2021-03-26: qty 1
  Filled 2021-03-26 (×3): qty 2
  Filled 2021-03-26 (×2): qty 1
  Filled 2021-03-26: qty 2
  Filled 2021-03-26: qty 1
  Filled 2021-03-26: qty 2
  Filled 2021-03-26: qty 1

## 2021-03-26 MED ORDER — BISACODYL 5 MG PO TBEC: 5.0000 mg | DELAYED_RELEASE_TABLET | Freq: Every day | ORAL | Status: DC | PRN

## 2021-03-26 MED ORDER — ZINC SULFATE 220 (50 ZN) MG PO CAPS
220.0000 mg | ORAL_CAPSULE | Freq: Every day | ORAL | Status: AC
  Administered 2021-03-27 – 2021-03-30 (×4): 220 mg via ORAL
  Filled 2021-03-26 (×4): qty 1

## 2021-03-26 MED ORDER — TRAMADOL HCL 50 MG PO TABS: 50.0000 mg | ORAL_TABLET | Freq: Four times a day (QID) | ORAL | Status: DC | PRN

## 2021-03-26 MED ORDER — PANTOPRAZOLE SODIUM 40 MG PO TBEC: 40.0000 mg | DELAYED_RELEASE_TABLET | Freq: Two times a day (BID) | ORAL | Status: DC

## 2021-03-26 MED ORDER — ADULT MULTIVITAMIN W/MINERALS CH
1.0000 | ORAL_TABLET | Freq: Every day | ORAL | Status: DC
  Administered 2021-03-27 – 2021-04-13 (×18): 1 via ORAL
  Filled 2021-03-26 (×18): qty 1

## 2021-03-26 MED ORDER — POLYSACCHARIDE IRON COMPLEX 150 MG PO CAPS
150.0000 mg | ORAL_CAPSULE | Freq: Every day | ORAL | Status: DC
  Administered 2021-03-27 – 2021-04-12 (×17): 150 mg via ORAL
  Filled 2021-03-26 (×18): qty 1

## 2021-03-26 MED ORDER — TRAMADOL HCL 50 MG PO TABS
50.0000 mg | ORAL_TABLET | Freq: Four times a day (QID) | ORAL | Status: DC | PRN
  Administered 2021-03-26 – 2021-04-01 (×8): 50 mg via ORAL
  Filled 2021-03-26 (×8): qty 1

## 2021-03-26 MED ORDER — INSULIN ASPART 100 UNIT/ML ~~LOC~~ SOLN
4.0000 [IU] | Freq: Three times a day (TID) | SUBCUTANEOUS | Status: DC
  Administered 2021-03-26 – 2021-04-07 (×27): 4 [IU] via SUBCUTANEOUS

## 2021-03-26 MED ORDER — ACETAMINOPHEN 325 MG PO TABS: 325.0000 mg | ORAL_TABLET | Freq: Four times a day (QID) | ORAL | Status: DC | PRN

## 2021-03-26 MED ORDER — ATORVASTATIN CALCIUM 10 MG PO TABS
20.0000 mg | ORAL_TABLET | Freq: Two times a day (BID) | ORAL | Status: DC
  Administered 2021-03-26 – 2021-04-13 (×36): 20 mg via ORAL
  Filled 2021-03-26 (×36): qty 2

## 2021-03-26 MED ORDER — NYSTATIN 100000 UNIT/GM EX POWD
Freq: Two times a day (BID) | CUTANEOUS | Status: AC
  Filled 2021-03-26: qty 15

## 2021-03-26 MED ORDER — NYSTATIN 100000 UNIT/GM EX POWD: Freq: Two times a day (BID) | CUTANEOUS | 0 refills | Status: DC

## 2021-03-26 MED ORDER — ALBUTEROL SULFATE (2.5 MG/3ML) 0.083% IN NEBU: 2.5000 mg | INHALATION_SOLUTION | Freq: Four times a day (QID) | RESPIRATORY_TRACT | Status: DC | PRN

## 2021-03-26 MED ORDER — INSULIN ASPART 100 UNIT/ML ~~LOC~~ SOLN: 4.0000 [IU] | Freq: Three times a day (TID) | SUBCUTANEOUS | 11 refills | Status: DC

## 2021-03-26 MED ORDER — POLYETHYLENE GLYCOL 3350 17 G PO PACK: 17.0000 g | PACK | Freq: Every day | ORAL | Status: DC | PRN

## 2021-03-26 MED ORDER — INSULIN GLARGINE 100 UNIT/ML ~~LOC~~ SOLN: 12.0000 [IU] | Freq: Every day | SUBCUTANEOUS | 11 refills | Status: DC

## 2021-03-26 MED ORDER — GLUCERNA SHAKE PO LIQD
237.0000 mL | Freq: Three times a day (TID) | ORAL | Status: DC
  Administered 2021-03-26 – 2021-04-12 (×49): 237 mL via ORAL
  Filled 2021-03-26: qty 237

## 2021-03-26 MED ORDER — PANTOPRAZOLE SODIUM 40 MG PO TBEC
40.0000 mg | DELAYED_RELEASE_TABLET | Freq: Two times a day (BID) | ORAL | Status: DC
  Administered 2021-03-26 – 2021-04-13 (×36): 40 mg via ORAL
  Filled 2021-03-26 (×37): qty 1

## 2021-03-26 MED ORDER — INSULIN ASPART 100 UNIT/ML ~~LOC~~ SOLN
0.0000 [IU] | Freq: Three times a day (TID) | SUBCUTANEOUS | Status: DC
  Administered 2021-03-26: 3 [IU] via SUBCUTANEOUS
  Administered 2021-03-27 – 2021-03-29 (×4): 2 [IU] via SUBCUTANEOUS
  Administered 2021-03-30 (×2): 3 [IU] via SUBCUTANEOUS
  Administered 2021-03-31: 5 [IU] via SUBCUTANEOUS
  Administered 2021-03-31 – 2021-04-04 (×8): 2 [IU] via SUBCUTANEOUS
  Administered 2021-04-04: 3 [IU] via SUBCUTANEOUS
  Administered 2021-04-05: 5 [IU] via SUBCUTANEOUS
  Administered 2021-04-05: 2 [IU] via SUBCUTANEOUS
  Administered 2021-04-05 – 2021-04-06 (×2): 3 [IU] via SUBCUTANEOUS
  Administered 2021-04-07 – 2021-04-09 (×3): 2 [IU] via SUBCUTANEOUS
  Administered 2021-04-10: 3 [IU] via SUBCUTANEOUS
  Administered 2021-04-10 (×2): 2 [IU] via SUBCUTANEOUS
  Administered 2021-04-11 – 2021-04-12 (×2): 3 [IU] via SUBCUTANEOUS
  Administered 2021-04-12: 2 [IU] via SUBCUTANEOUS
  Administered 2021-04-12: 5 [IU] via SUBCUTANEOUS

## 2021-03-26 MED ORDER — GABAPENTIN 300 MG PO CAPS
300.0000 mg | ORAL_CAPSULE | Freq: Three times a day (TID) | ORAL | Status: DC
  Administered 2021-03-26 – 2021-04-13 (×54): 300 mg via ORAL
  Filled 2021-03-26 (×54): qty 1

## 2021-03-26 MED ORDER — METOPROLOL TARTRATE 12.5 MG HALF TABLET
12.5000 mg | ORAL_TABLET | Freq: Every day | ORAL | Status: DC
  Administered 2021-03-27 – 2021-04-02 (×7): 12.5 mg via ORAL
  Filled 2021-03-26 (×7): qty 1

## 2021-03-26 MED ORDER — APIXABAN 5 MG PO TABS
5.0000 mg | ORAL_TABLET | Freq: Two times a day (BID) | ORAL | Status: DC
  Administered 2021-03-26 – 2021-04-13 (×36): 5 mg via ORAL
  Filled 2021-03-26 (×36): qty 1

## 2021-03-26 MED ORDER — DOCUSATE SODIUM 100 MG PO CAPS
100.0000 mg | ORAL_CAPSULE | Freq: Every day | ORAL | Status: DC
  Administered 2021-03-28 – 2021-04-13 (×17): 100 mg via ORAL
  Filled 2021-03-26 (×18): qty 1

## 2021-03-26 MED ORDER — MAGNESIUM OXIDE 400 (241.3 MG) MG PO TABS
400.0000 mg | ORAL_TABLET | Freq: Every day | ORAL | Status: DC
  Administered 2021-03-27 – 2021-04-02 (×7): 400 mg via ORAL
  Filled 2021-03-26 (×8): qty 1

## 2021-03-26 MED ORDER — LIVING WELL WITH DIABETES BOOK
Freq: Once | Status: AC
  Administered 2021-03-26: 1
  Filled 2021-03-26: qty 1

## 2021-03-26 MED ORDER — INSULIN GLARGINE 100 UNIT/ML ~~LOC~~ SOLN
12.0000 [IU] | Freq: Every day | SUBCUTANEOUS | Status: DC
  Administered 2021-03-26 – 2021-03-30 (×5): 12 [IU] via SUBCUTANEOUS
  Filled 2021-03-26 (×6): qty 0.12

## 2021-03-26 MED ORDER — ASCORBIC ACID 500 MG PO TABS
1000.0000 mg | ORAL_TABLET | Freq: Every day | ORAL | Status: DC
  Administered 2021-03-27 – 2021-04-13 (×18): 1000 mg via ORAL
  Filled 2021-03-26 (×18): qty 2

## 2021-03-26 MED ORDER — ESCITALOPRAM OXALATE 10 MG PO TABS
10.0000 mg | ORAL_TABLET | Freq: Every day | ORAL | Status: DC
  Administered 2021-03-27 – 2021-04-13 (×18): 10 mg via ORAL
  Filled 2021-03-26 (×18): qty 1

## 2021-03-26 NOTE — H&P (Signed)
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  History taken from chart review and patient.  Patient lives with spouse.  Two-level home bed and bath main level.  Ramped entrance.  Independent with assistive device.  Wife does assist with some ADLs.  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 Sharol Given with wound VAC applied.  Hospital was complicated by acute blood loss anemia.  Postoperative anemia 6.0.  He was transfused 2 units packed red blood cells with latest hemoglobin 8.0.  His renal function remained stable with latest creatinine 1.12.  Patient did have some orthostasis requiring norepinephrine.  His chronic Eliquis remained on hold after BKA until 03/24/2021 and resumed.  Echocardiogram on 03/20/2019 showed ejection fraction of 50-55%, no wall motion abnormalities.  Wound VAC was removed 03/24/2021.  Therapy evaluations completed due to patient decreased functional mobility was admitted for a comprehensive rehab program.  Please see preadmission assessment from earlier today as well.  Review of Systems  Constitutional: Negative for chills and fever.  HENT: Negative for hearing loss.   Eyes: Negative for blurred vision and double vision.  Respiratory: Positive for shortness of breath (Baseline). Negative for cough.        Shortness  of breath with exertion  Cardiovascular: Positive for leg swelling. Negative for chest pain and palpitations.  Gastrointestinal: Negative for constipation, heartburn, nausea and vomiting.       GERD  Genitourinary: Positive for urgency. Negative for dysuria, flank pain and hematuria.  Musculoskeletal: Positive for back pain, joint pain and myalgias.  Skin: Negative for rash.  Neurological: Positive for sensory change and weakness.  Psychiatric/Behavioral: Positive for depression. The patient has insomnia.        Anxiety  All other systems reviewed and are negative.  Past Medical History:  Diagnosis Date  . Anxiety   . 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 Cataract   . Chronic kidney disease    d/t DM  . CKD (chronic kidney disease), stage III ([place])   . Depression   . Diabetes mellitus    Vgo disposal insulin bolus  simular to insulin pump  . Dyspnea   . GERD (gastroesophageal reflux disease)   . History of kidney stones    passed  . Hyperlipidemia   . Hypertension   . Idiopathic pulmonary fibrosis ([place]) 11/2016  . ILD (interstitial lung disease) ([place])   . Moderate aortic stenosis    a. 10/2019 Echo: EF 55-60%, Gr2 DD. Nl RV.   Marland Kitchen Neuromuscular disorder ([place])   . Neuropathy associated with endocrine disorder ([place])   . Nonobstructive CAD (coronary artery disease)    a. 2012 Cath: mod, nonobs dzs; b. 10/2016 MV: EF 60%, no ischemia.  . OSA on CPAP 05/05/2017   Unattended Home Sleep Test 7/2/813-AHI 38.6/hour, desaturation to 64%, body weight 261 pounds  . PONV (postoperative nausea and vomiting)   . Sleep apnea  uses cpap asked to bring mask and tubing   Past Surgical History:  Procedure Laterality Date  . ABDOMINAL AORTOGRAM W/LOWER EXTREMITY N/A 12/10/2020   Procedure: ABDOMINAL AORTOGRAM W/LOWER EXTREMITY;  Surgeon: Cherre Robins, MD;  Location: [place] [facility];  Service: Cardiovascular;  Laterality: N/A;  . AMPUTATION Right 01/22/2021   Procedure: 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;   Surgeon: [name], MD;  Location: [place];  Service: Orthopedics;  Laterality: Right;  . AMPUTATION Right 03/17/2021   Procedure: RIGHT BELOW KNEE AMPUTATION;  Surgeon: [name], MD;  Location: [place];  Service: Orthopedics;  Laterality: Right;  . ANKLE FUSION Right 01/22/2021   Procedure: RIGHT FOOT TIBIOCALCANEAL FUSION;  Surgeon: [name], MD;  Location: [place];  Service: Orthopedics;  Laterality: Right;  . ANTERIOR FUSION CERVICAL SPINE  2012  . CARDIAC CATHETERIZATION  2011  . CARDIAC CATHETERIZATION N/A 11/09/2016   Procedure: Right Heart Cath;  Surgeon: Belva Crome, MD;  Location: [employer] [facility] [facility];  Service: Cardiovascular;  Laterality: N/A;  . carpel tunnel     left wrist  . CATARACT EXTRACTION    . CATARACT EXTRACTION W/ INTRAOCULAR LENS  IMPLANT, BILATERAL  2013  . CERVICAL LAMINECTOMY  2012  . COLONOSCOPY N/A 01/14/2013   Procedure: COLONOSCOPY;  Surgeon: Irene Shipper, MD;  Location: [facility];  Service: Endoscopy;  Laterality: N/A;  . CORONARY ARTERY BYPASS GRAFT  11/04/2020   LIMA-LAD, SVG-OM1, SVG-PDA (Dr Marney [facility]) dc 11/18/2020  . EYE SURGERY    . I & D EXTREMITY Right 02/19/2021   Procedure: RIGHT ANKLE DEBRIDEMENT AND PLACEMENT ANTIBIOTIC BEADS;  Surgeon: [name], MD;  Location: [place];  Service: Orthopedics;  Laterality: Right;  . [place]   left  . LEFT HEART CATH AND CORONARY ANGIOGRAPHY N/A 07/10/2020   Procedure: LEFT HEART CATH AND CORONARY ANGIOGRAPHY;  Surgeon: Sherren Mocha, MD;  Location: [place] [facility];  Service: Cardiovascular;  Laterality: N/A;  . LUMBAR LAMINECTOMY  2003  . LUNG BIOPSY Left 12/26/2016   Procedure: LUNG BIOPSY;  Surgeon: [name], MD;  Location: [place];  Service: Thoracic;  Laterality: Left;  . PACEMAKER IMPLANT N/A 03/30/2020   Procedure: PACEMAKER IMPLANT;  Surgeon: Evans Lance, MD;  Location: [place] [facility];  Service: Cardiovascular;  Laterality: N/A;  . PERIPHERAL VASCULAR  INTERVENTION Right 12/10/2020   Procedure: PERIPHERAL VASCULAR INTERVENTION;  Surgeon: Cherre Robins, MD;  Location: [place] [facility];  Service: Cardiovascular;  Laterality: Right;  SFA  . POSTERIOR FUSION CERVICAL SPINE  2012  . PPM GENERATOR REMOVAL N/A 12/14/2020   Procedure: PPM GENERATOR REMOVAL;  Surgeon: Evans Lance, MD;  Location: [place] [facility];  Service: Cardiovascular;  Laterality: N/A;  . TEE WITHOUT CARDIOVERSION N/A 12/11/2020   Procedure: TRANSESOPHAGEAL ECHOCARDIOGRAM (TEE);  Surgeon: Geralynn Rile, MD;  Location: [place];  Service: Cardiovascular;  Laterality: N/A;  . TRIGGER FINGER RELEASE  2011   4th finger left hand  . VIDEO ASSISTED THORACOSCOPY Left 12/26/2016   Procedure: VIDEO ASSISTED THORACOSCOPY;  Surgeon: [name], MD;  Location: [place];  Service: Thoracic;  Laterality: Left;  Marland Kitchen VIDEO BRONCHOSCOPY N/A 12/26/2016   Procedure: VIDEO BRONCHOSCOPY;  Surgeon: [name], MD;  [facility];  Service: Thoracic;  Laterality: N/A;   Family History  Problem Relation Age of Onset  . Diabetes Mellitus II Mother   . Emphysema Father 30  .  Heart attack Father   . Colon cancer Neg Hx   . Esophageal cancer Neg Hx   . Rectal cancer Neg Hx   . Stomach cancer Neg Hx    Social History:  reports that he has never smoked. He has never used smokeless tobacco. He reports that he does not drink alcohol and does not use drugs. Allergies:  Allergies  Allergen Reactions  . Codeine Hives and Itching  . Ofev [Nintedanib] Diarrhea    SEVERE DIARRHEA  . Pirfenidone Diarrhea and Other (See Comments)    Esbriet (Pirfenidone) causes elevated LFTs. D/C on 06/14/17 and SEVERE DIARRHEA    Medications Prior to Admission  Medication Sig Dispense Refill  . albuterol (VENTOLIN HFA) 108 (90 Base) MCG/ACT inhaler Inhale 2 puffs into the lungs every 4 (four) hours as needed for wheezing or shortness of breath.    Marland Kitchen apixaban (ELIQUIS) 5 MG TABS tablet  Take 1 tablet (5 mg total) by mouth 2 (two) times daily. 60 tablet 11  . Ascorbic Acid (VITAMIN C WITH ROSE HIPS) 500 MG tablet Take 500 mg by mouth daily.    Marland Kitchen aspirin EC 81 MG tablet Take 81 mg by mouth daily.    Marland Kitchen atorvastatin (LIPITOR) 40 MG tablet Take 20 mg by mouth in the morning and at bedtime.    . [EXPIRED] ciprofloxacin (CIPRO) 500 MG tablet Take 1 tablet (500 mg total) by mouth 2 (two) times daily. 60 tablet 0  . Collagenase (SANTYL EX) Apply 1 application topically 2 (two) times daily.    . Continuous Blood Gluc Sensor (FREESTYLE LIBRE 14 DAY SENSOR) MISC 1 patch every 14 (fourteen) days.    . diclofenac (VOLTAREN) 75 MG EC tablet Take 75 mg by mouth 2 (two) times daily as needed for pain.    . diphenhydrAMINE HCl, Sleep, (ZZZQUIL) 25 MG CAPS Take 25 mg by mouth at bedtime as needed (sleep).    . Ensure Max Protein (ENSURE MAX PROTEIN) LIQD Take 330 mLs (11 oz total) by mouth 2 (two) times daily. 3330 mL 0  . escitalopram (LEXAPRO) 10 MG tablet Take 10 mg by mouth daily.    Marland Kitchen FARXIGA 10 MG TABS tablet Take 10 mg by mouth daily.    . furosemide (LASIX) 40 MG tablet Take 40 mg by mouth daily.    Marland Kitchen HYDROcodone-acetaminophen (NORCO/VICODIN) 5-325 MG tablet Take 1 tablet by mouth every 4 (four) hours as needed. (Patient taking differently: Take 1 tablet by mouth every 4 (four) hours as needed for moderate pain.) 30 tablet 0  . insulin lispro (HUMALOG) 100 UNIT/ML injection Inject 0.07 mLs (7 Units total) into the skin 3 (three) times daily with meals. (Patient taking differently: Inject 10 Units into the skin 3 (three) times daily with meals.) 10 mL 0  . iron polysaccharides (NIFEREX) 150 MG capsule Take 150 mg by mouth daily with lunch.    Marland Kitchen LORazepam (ATIVAN) 1 MG tablet Take 1 mg by mouth at bedtime.    Marland Kitchen losartan (COZAAR) 50 MG tablet Take 25 mg by mouth daily.    . magnesium oxide (MAG-OX) 400 MG tablet TAKE 1 CAPSULE BY MOUTH ONCE DAILY (Patient taking differently: Take 400 mg by  mouth daily.) 90 tablet 1  . metoprolol tartrate (LOPRESSOR) 50 MG tablet Take 25 mg by mouth daily.    . Multiple Vitamin (MULTIVITAMIN WITH MINERALS) TABS tablet Take 1 tablet by mouth daily.    . Olopatadine HCl (PATADAY OP) Place 1 drop into both eyes daily  as needed (allergies).    . ondansetron (ZOFRAN) 4 MG tablet TAKE 1 TABLET BY MOUTH EVERY 8 HOURS AS NEEDED FOR NAUSEA AND VOMITING (Patient taking differently: Take 4 mg by mouth every 8 (eight) hours as needed for nausea or vomiting.) 20 tablet 0  . pantoprazole (PROTONIX) 40 MG tablet Take 40 mg by mouth daily.    . polyethylene glycol powder (GLYCOLAX/MIRALAX) 17 GM/SCOOP powder Take 17 g by mouth daily as needed for moderate constipation.    . Probiotic Product (PROBIOTIC PO) Take 1 capsule by mouth daily.    . Promethazine-Phenyleph-Codeine (PROMETHAZINE VC/CODEINE) 6.25-5-10 MG/5ML SYRP Take 5 mLs by mouth every 6 (six) hours as needed for cough.    . zinc sulfate 220 (50 Zn) MG capsule Take 220 mg by mouth daily.      Drug Regimen Review Drug regimen was reviewed and remains appropriate with no significant issues identified  Home: Home Living Family/patient expects to be discharged to:: Private residence Living Arrangements: Spouse/significant other Available Help at Discharge: Family,Available PRN/intermittently Type of Home: House Home Access: Ramped entrance Home Layout: Two level,Able to live on main level with bedroom/bathroom Bathroom Shower/Tub: Multimedia programmer: Handicapped height Bathroom Accessibility: Yes Home Equipment: Walker - 4 wheels,Cane - single point,Cane - quad,Shower seat,Grab bars - tub/shower,Hand held shower head,Wheelchair - manual,Wheelchair - power   Functional History: Prior Function Level of Independence: Independent with assistive device(s) Comments: using RW and electric w/c to navigate home and community. Ind with most ADLs, states his wife does help with washing back and  buttocks (per recent admission 3 weeks ago); pt reports mostly transfers s/p R ankle I&D due to limited WBing status  Functional Status:  Mobility: Bed Mobility Overal bed mobility: Modified Independent Bed Mobility: Supine to Sit,Sit to Supine Supine to sit: Modified independent (Device/Increase time),HOB elevated Sit to supine: Modified independent (Device/Increase time),HOB elevated General bed mobility comments: use of bedrails Transfers Overall transfer level: Needs assistance Equipment used: Rolling walker (2 wheeled),1 person hand held assist Transfers: Sit to/from Stand Sit to Stand: Min assist,From elevated surface Stand pivot transfers: Min assist Squat pivot transfers: Min assist General transfer comment: Assist for balance to rise. Pt repeated x 6. Pt with placement of rt hand on walker and left hand pushing up from bed Ambulation/Gait Ambulation/Gait assistance: Min assist,+2 safety/equipment Gait Distance (Feet): 3 Feet Assistive device: Rolling walker (2 wheeled) Gait Pattern/deviations:  (pivot on ball of foot, hop-to pattern) General Gait Details: Did not attempt with 1 person assist Gait velocity: slowed Gait velocity interpretation: <1.31 ft/sec, indicative of household ambulator    ADL: ADL Overall ADL's : Needs assistance/impaired Eating/Feeding: Set up,Sitting Grooming: Set up,Sitting Upper Body Bathing: Set up,Sitting Lower Body Bathing: Moderate assistance,Sit to/from stand,+2 for safety/equipment Upper Body Dressing : Set up,Sitting Lower Body Dressing: Moderate assistance,Sit to/from stand Lower Body Dressing Details (indicate cue type and reason): Close to progressing to Min A, problem solved which LE to don first for easier task as pt difficulty reaching L LE. Pt required assist to don around L LE and to maintain balance in standing with RW. Educated on alternating hands and awareness of balance to decrease fall risk Toilet Transfer: Minimal  Dealer Details (indicate cue type and reason): simulated to/from recliner as pt reports difficulty transferring back to bed after therapy sessions. BSC in room likely too small for pt - plan to coordinate to have larger BSC delivered to room Functional mobility during ADLs: Moderate assistance,Rolling walker,Minimal assistance ([place]  for complete transfer, minA for sit to stand and sliding L foot towards HPB) General ADL Comments: Collaborated on safety strategies, compensatory strategies for LB ADLs to decrease fall risk. Pt very engaged in education and demo good safety awareness. Discussed shower setup with OT suggesting pt have shower chair near lip of walk in shower to eliminate need to attempt hopping over lip  Cognition: Cognition Overall Cognitive Status: Within Functional Limits for tasks assessed Orientation Level: Oriented X4 Cognition Arousal/Alertness: Awake/alert Behavior During Therapy: WFL for tasks assessed/performed Overall Cognitive Status: Within Functional Limits for tasks assessed General Comments: educated pt on grieving process associated with amputations  Physical Exam: Blood pressure 116/69, pulse 60, temperature 98.5 F (36.9 C), temperature source Oral, resp. rate 14, height 6' (1.829 m), weight 113.1 kg, SpO2 98 %. Physical Exam Vitals reviewed.  Constitutional:      Appearance: He is normal weight.  HENT:     Head: Normocephalic and atraumatic.     Right Ear: External ear normal.     Left Ear: External ear normal.     Nose: Nose normal.  Eyes:     General:        Right eye: No discharge.        Left eye: No discharge.     Extraocular Movements: Extraocular movements intact.  Cardiovascular:     Comments: Irregularly irregular Pulmonary:     Effort: Pulmonary effort is normal. No respiratory distress.     Breath sounds: No stridor.  Abdominal:     General: Abdomen is flat. Bowel sounds are normal. There is no  distension.  Musculoskeletal:     Cervical back: Normal range of motion and neck supple.     Comments: Right BKA with edema and tenderness  Skin:    Comments: Right BKA with dressing CDI  Neurological:     Mental Status: He is alert.     Comments: Alert and Oriented x3  Motor: Bilateral upper extremities: 5/5 proximal distal Left lower extremity: [place] 10/5 proximal distal Right lower extremity: Hip flexion, knee extension 4+/5 Sensation diminished to light touch distal to mid tibia and left lower extremity  Psychiatric:        Mood and Affect: Mood normal.        Behavior: Behavior normal.     Results for orders placed or performed during the hospital encounter of 03/17/21 (from the past 48 hour(s))  Glucose, capillary     Status: Abnormal   Collection Time: 03/24/21  8:57 AM  Result Value Ref Range   Glucose-Capillary 170 (H) 70 - 99 mg/dL    Comment: Glucose reference range applies only to samples taken after fasting for at least 8 hours.  Glucose, capillary     Status: Abnormal   Collection Time: 03/24/21 11:41 AM  Result Value Ref Range   Glucose-Capillary 180 (H) 70 - 99 mg/dL    Comment: Glucose reference range applies only to samples taken after fasting for at least 8 hours.  Glucose, capillary     Status: Abnormal   Collection Time: 03/24/21  5:24 PM  Result Value Ref Range   Glucose-Capillary 168 (H) 70 - 99 mg/dL    Comment: Glucose reference range applies only to samples taken after fasting for at least 8 hours.  Glucose, capillary     Status: Abnormal   Collection Time: 03/24/21  8:49 PM  Result Value Ref Range   Glucose-Capillary 162 (H) 70 - 99 mg/dL    Comment: Glucose reference  range applies only to samples taken after fasting for at least 8 hours.  Glucose, capillary     Status: Abnormal   Collection Time: 03/25/21  8:04 AM  Result Value Ref Range   Glucose-Capillary 136 (H) 70 - 99 mg/dL    Comment: Glucose reference range applies only to samples taken  after fasting for at least 8 hours.  Glucose, capillary     Status: Abnormal   Collection Time: 03/25/21 11:46 AM  Result Value Ref Range   Glucose-Capillary 116 (H) 70 - 99 mg/dL    Comment: Glucose reference range applies only to samples taken after fasting for at least 8 hours.  Glucose, capillary     Status: None   Collection Time: 03/25/21  5:41 PM  Result Value Ref Range   Glucose-Capillary 90 70 - 99 mg/dL    Comment: Glucose reference range applies only to samples taken after fasting for at least 8 hours.  Glucose, capillary     Status: Abnormal   Collection Time: 03/25/21  9:03 PM  Result Value Ref Range   Glucose-Capillary 170 (H) 70 - 99 mg/dL    Comment: Glucose reference range applies only to samples taken after fasting for at least 8 hours.  CBC with Differential/Platelet     Status: Abnormal   Collection Time: 03/26/21  4:09 AM  Result Value Ref Range   WBC 7.9 4.0 - 10.5 K/uL   RBC 2.85 (L) 4.22 - 5.81 MIL/uL   Hemoglobin 8.0 (L) 13.0 - 17.0 g/dL   HCT 25.4 (L) 39.0 - 52.0 %   MCV 89.1 80.0 - 100.0 fL   MCH 28.1 26.0 - 34.0 pg   MCHC 31.5 30.0 - 36.0 g/dL   RDW 14.6 11.5 - 15.5 %   Platelets 292 150 - 400 K/uL   nRBC 0.0 0.0 - 0.2 %   Neutrophils Relative % 53 %   Neutro Abs 4.2 1.7 - 7.7 K/uL   Lymphocytes Relative 25 %   Lymphs Abs 2.0 0.7 - 4.0 K/uL   Monocytes Relative 11 %   Monocytes Absolute 0.8 0.1 - 1.0 K/uL   Eosinophils Relative 10 %   Eosinophils Absolute 0.8 (H) 0.0 - 0.5 K/uL   Basophils Relative 1 %   Basophils Absolute 0.1 0.0 - 0.1 K/uL   Immature Granulocytes 0 %   Abs Immature Granulocytes 0.03 0.00 - 0.07 K/uL    Comment: Performed at [facility], [address] [place], [place] [address]  Magnesium     Status: None   Collection Time: 03/26/21  4:09 AM  Result Value Ref Range   Magnesium 1.9 1.7 - 2.4 mg/dL    Comment: Performed at [facility] [address], [place], [place] [address]  Phosphorus     Status: None    Collection Time: 03/26/21  4:09 AM  Result Value Ref Range   Phosphorus 3.3 2.5 - 4.6 mg/dL    Comment: Performed at [facility] [address] [place], [place] [address]  Basic metabolic panel     Status: Abnormal   Collection Time: 03/26/21  4:09 AM  Result Value Ref Range   Sodium 131 (L) 135 - 145 mmol/L   Potassium 5.0 3.5 - 5.1 mmol/L   Chloride 98 98 - 111 mmol/L   CO2 28 22 - 32 mmol/L   Glucose, Bld 117 (H) 70 - 99 mg/dL    Comment: Glucose reference range applies only to samples taken after  fasting for at least 8 hours.   BUN 22 8 - 23 mg/dL   Creatinine, Ser 1.12 0.61 - 1.24 mg/dL   Calcium 8.1 (L) 8.9 - 10.3 mg/dL   GFR, Estimated >60 >60 mL/min    Comment: (NOTE) Calculated using the CKD-EPI Creatinine Equation (2021)    Anion gap 5 5 - 15    Comment: Performed at [facility] [address] [place], [place] [address]   DG CHEST PORT 1 VIEW  Result Date: 03/24/2021 CLINICAL DATA:  Shortness of breath EXAM: PORTABLE CHEST 1 VIEW COMPARISON:  12/16/2020 FINDINGS: Cardiac shadow is enlarged but stable. Postsurgical changes are noted. Diffuse vascular congestion is again noted likely a portion of which is chronic in nature. Diffuse fibrotic changes are noted worse on the left than the right. No acute bony abnormality is seen. Postsurgical changes in the cervical spine are noted. IMPRESSION: Chronic appearing vascular congestion and fibrotic changes similar to that seen on the prior study. No acute abnormality noted. Electronically Signed   By: Inez Catalina M.D.   On: 03/24/2021 18:49       Medical Problem List and Plan: 1.  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  Wound VAC removed 03/24/2021.  -patient may shower with stump covered  -ELOS/Goals: 12 days/supervision/mod  Admit to CIR 2.  Antithrombotics: -DVT/anticoagulation: Eliquis  -antiplatelet therapy: N/A 3. Pain Management: Neurontin 300 mg 3 times daily,  oxycodone as needed  Monitor with increased exertion, particularly for phantom limb pain 4. Mood: Lexapro 10 mg daily, Ativan 1 mg nightly  -antipsychotic agents: N/A 5. Neuropsych: This patient is capable of making decisions on his own behalf. 6. Skin/Wound Care: Routine skin checks 7. Fluids/Electrolytes/Nutrition: Routine in and outs 8.  Acute blood loss anemia.  Continue iron supplement.    CBC ordered 9.  Hypertension.  Lopressor 12.5 mg twice daily.    Monitor with increased mobility 10.  Diabetes mellitus with hyperglycemia.  Latest hemoglobin A1c 7.8.  NovoLog 4 units 3 times daily with meals, Lantus insulin 12 units nightly.  Check blood sugars before meals and at bedtime  Monitor increase mobility 11.  GERD.  Protonix 12.  Dyslipidemia: Lipitor 13.  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  Pacemaker removed 12/14/2020 due to staph bacteremia.  Follow-up cardiology services 14.  Idiopathic pulmonary fibrosis.  Continue CPAP 15.  Atrial fibrillation.  Cardiac rate controlled.  Continue Eliquis.  Monitor increase activity 16.  CAD with CABG.  No chest pain or increasing shortness of breath 17.  CKD stage III.    CMP ordered  Cathlyn Parsons, PA-C 03/26/2021  I have personally performed a face to face diagnostic evaluation, including, but not limited to relevant history and physical exam findings, of this patient and developed relevant assessment and plan.  Additionally, I have reviewed and concur with the physician assistant's documentation above.  Delice Lesch, MD, ABPMR

## 2021-03-26 NOTE — Progress Notes (Signed)
Report given to RN Josh on CIR unit. All questions asked and answered. PIV left in place for transfer per request. Will transfer once room is ready on CIR, Merrily Pew will notify RN Alora Gorey.

## 2021-03-26 NOTE — IPOC Note (Signed)
Individualized overall Plan of Care [facility]) Patient Details Name: Shane [name] MRN: [id number] DOB: 06/26/1941  Admitting Diagnosis: Right below-knee amputee [name])  Hospital Problems: Principal Problem:   Right below-knee amputee [facility]) Active Problems:   Hyponatremia     Functional Problem List: Nursing Medication Management,Safety,Pain,Edema,Endurance,Bowel,Skin Integrity  PT Balance,Perception,Safety,Edema,Sensory,Endurance,Skin Integrity,Motor,Nutrition,Pain  OT Balance,Edema,Motor,Pain  SLP    TR         Basic ADL's: OT Grooming,Bathing,Dressing,Toileting     Advanced  ADL's: OT       Transfers: PT Bed Mobility,Bed to Chair,Car,Furniture  OT Toilet,Tub/Shower     Locomotion: PT Ambulation,Wheelchair Mobility     Additional Impairments: OT None  SLP        TR      Anticipated Outcomes Item Anticipated Outcome  Self Feeding N/A  Swallowing      Basic self-care  Supervision overall  Toileting  Supervision   Bathroom Transfers Supervision  Bowel/Bladder  Manage bowel with mod I assist  Transfers  supervision using LRAD  Locomotion  supervision using LRAD  Communication     Cognition     Pain  pain at or below level 4  Safety/Judgment  maintain safety with cues/reminders   Therapy Plan: PT Intensity: Minimum of 1-2 x/day ,45 to 90 minutes PT Frequency: 5 out of 7 days PT Duration Estimated Length of Stay: 10-12days OT Intensity: Minimum of 1-2 x/day, 45 to 90 minutes OT Frequency: 5 out of 7 days OT Duration/Estimated Length of Stay: 10-12 days      Team Interventions: Nursing Interventions Patient/Family Education,Bowel Management,Pain Management,Discharge Planning,Skin Care/Wound Teacher, early years/pre Management/Prevention  PT interventions Ambulation/gait training,Community Corporate treasurer re-education,Psychosocial support,UE/LE Strength taining/ROM,Wheelchair  propulsion/positioning,Balance/vestibular training,Discharge planning,Functional electrical stimulation,Pain management,Skin care/wound management,Therapeutic Activities,UE/LE Coordination activities,Cognitive remediation/compensation,Disease management/prevention,Functional mobility training,Patient/family education,Splinting/orthotics,Therapeutic Exercise,Visual/perceptual remediation/compensation  OT Interventions Balance/vestibular training,Disease mangement/prevention,Therapeutic Exercise,Self Care/advanced ADL retraining,Wheelchair propulsion/positioning,DME/adaptive equipment instruction,Pain management,Skin care/wound managment,UE/LE Strength taining/ROM,Community reintegration,Patient/family education,UE/LE Coordination activities,Discharge planning,Functional mobility training,Psychosocial support,Therapeutic Activities  SLP Interventions    TR Interventions    SW/CM Interventions     Barriers to Discharge MD  Medical stability, Weight, and left foot ulcer  Nursing Decreased caregiver support,Home environment access/layout 2 level ramped entry, B/B on main with electric scooter/DME at home  PT Decreased caregiver support    OT      SLP      SW       Team Discharge Planning: Destination: PT-Home ,OT- Home , SLP-  Projected Follow-up: PT-Home health PT,24 hour supervision/assistance, OT-  None, SLP-  Projected Equipment Needs: PT-To be determined,Other (comment), OT- None recommended by OT, SLP-  Equipment Details: PT-pt recently received wheelchair in January 2022, OT-Pt has equipment Patient/family involved in discharge planning: PT- Patient,  OT-Patient,Family Midwife, SLP-   MD ELOS: 8-11 days. 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  Two-level home bed and bath main level.  Ramped entrance.  Independent with assistive device.  Wife does assist with some ADLs.  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  Due to the progressive gangrenous changes limb was not felt to be salvageable and patient underwent right transtibial  amputation on 03/17/2021 per Dr. Sharol Given with wound VAC applied.  Hospital was complicated by acute blood loss anemia.  Postoperative anemia 6.0.  He was transfused 2 units packed red blood cells with latest hemoglobin 8.0.  His renal function remained stable with latest creatinine 1.12.  Patient did have some orthostasis requiring norepinephrine.  His chronic Eliquis remained on hold after BKA until 03/24/2021 and resumed.  Echocardiogram on 03/20/2019 showed ejection fraction of 50-55%, no wall motion abnormalities.  Wound VAC was removed 03/24/2021.  Therapy evaluations completed due to patient decreased functional mobility was admitted for a comprehensive rehab program.  Will set goals for Supervision with PT/OT.  Due to the current state of emergency, patients may not be receiving their 3-hours of Medicare-mandated therapy.  See Team Conference Notes for weekly updates to the plan of care

## 2021-03-26 NOTE — Progress Notes (Signed)
There is a bed available in CIR for pt to admit today.  Dr. Sloan Leiter is aware and in agreement. Pt, NSG, and TOC made aware.   Gayland Curry, [place], [place] Admissions Coordinator [phone]

## 2021-03-26 NOTE — Discharge Summary (Signed)
Physician Discharge Summary  Shane [name] HKV:[id number] DOB: 08-Aug-1941 DOA: 03/17/2021  PCP: Burnard Bunting, MD  Admit date: 03/17/2021 Discharge date: 03/26/2021  Admitted From: Home Disposition: Acute inpatient rehab  Recommendations for Outpatient Follow-up:  1. Follow up with PCP in 1-2 weeks 2. Please obtain BMP/CBC in one week 3. Schedule follow-up with Dr. Sharol Given on discharge  Home Health: Not applicable Equipment/Devices: Not applicable  Discharge Condition: Stable CODE STATUS: Full code Diet recommendation: Low-salt, low-carb diet  Discharge summary: 80 year old gentleman with history of chronic A. 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 Apparently patient took his Eliquis before surgery.  Assessment & Plan of care:   # Hemorrhagic shock secondary to excessive blood loss status post elective below-knee amputation in the setting of chronic anticoagulation therapy: Also exacerbated by use of antihypertensives. Hemoglobin dropped from 9.4-6 postprocedure. Total 3 units of PRBC transfusion.  Stabilized.  Transiently on Levophed. Hemoglobin has remained more than 8 and appropriately responded. Wound VAC removed 4/20. Postop wound care instructions as per surgery. Eliquis resumed.    Recheck hemoglobin in about 1 week to ensure stabilization.  # Chronic A. fib anticoagulated on Eliquis: His heart rate is acceptable.  Holding all antihypertensives and Eliquis.  Low-dose beta-blockers.  Eliquis resumed.  No evidence of ongoing bleeding. With ongoing use of Eliquis, there is no need to use aspirin.  # History  of type II heart block requiring pacemaker and removal due to infection.  Stable as per cardiology.  # Hypertension: Holding all antihypertensives.  His heart rate is acceptable, started metoprolol in low doses. We will continue to hold antihypertensives until rehab and follow-up.  # Idiopathic pulmonary fibrosis: Currently on surveillance with pulmonary as outpatient. Repeat chest x-ray is stable.  Complains of exertional dyspnea.  Will use bronchodilator therapy and chest physiotherapy with incentive spirometry.  # Obstructive sleep apnea: Using CPAP at night.  # Type 2 diabetes: On Farxiga and mealtime insulin at home.  Added long-acting insulin along with prandial insulin. Lantus 12 units at night, NovoLog 4 units with meals.  # CKD stage IIIb: Baseline creatinine about 1.6.  Creatinine 1.2.  #GERD: Patient with significant history of GERD, now exacerbated with acute illness. Will increase dose of Protonix to 40 mg twice a day until clinical improvement.  Patient is medically stable.  He is very appropriate candidate for aggressive acute inpatient rehab to transfer today.    Discharge Diagnoses:  Active Problems:   Abscess of bursa of right ankle   Atherosclerosis of native arteries of extremities with gangrene, right leg (HCC)   Postoperative hemorrhagic shock    Discharge Instructions  Discharge Instructions    Call MD for:  redness, tenderness, or signs of infection (pain, swelling, redness, odor or green/yellow discharge around incision site)   Complete by: As directed    Call MD for:  severe uncontrolled pain   Complete by: As directed    Call MD for:  temperature >100.4   Complete by: As directed    Diet - low sodium heart healthy   Complete by: As directed    Diet Carb Modified   Complete by: As directed    Discharge wound care:  Complete by: As directed    May cleanse amputation stump daily with antibacterial soap and water.  Apply clean dry dressing.   Can get stump wet but not soak it.  Patient will get protector but does not need to wear it in bed   Increase activity slowly   Complete by: As directed      Allergies as of 03/26/2021      Reactions   Codeine Hives, Itching   Ofev [nintedanib] Diarrhea   SEVERE DIARRHEA   Pirfenidone Diarrhea, Other (See Comments)   Esbriet (Pirfenidone) causes elevated LFTs. D/C on 06/14/17 and SEVERE DIARRHEA      Medication List    STOP taking these medications   aspirin EC 81 MG tablet   ciprofloxacin 500 MG tablet Commonly known as: Cipro   diclofenac 75 MG EC tablet Commonly known as: VOLTAREN   HYDROcodone-acetaminophen 5-325 MG tablet Commonly known as: NORCO/VICODIN   insulin lispro 100 UNIT/ML injection Commonly known as: HUMALOG   losartan 50 MG tablet Commonly known as: COZAAR     TAKE these medications   acetaminophen 325 MG tablet Commonly known as: TYLENOL Take 1-2 tablets (325-650 mg total) by mouth every 6 (six) hours as needed for mild pain (pain score 1-3 or temp > 100.5).   albuterol 108 (90 Base) MCG/ACT inhaler Commonly known as: VENTOLIN HFA Inhale 2 puffs into the lungs every 4 (four) hours as needed for wheezing or shortness of breath.   apixaban 5 MG Tabs tablet Commonly known as: ELIQUIS Take 1 tablet (5 mg total) by mouth 2 (two) times daily.   atorvastatin 40 MG tablet Commonly known as: LIPITOR Take 20 mg by mouth in the morning and at bedtime.   Ensure Max Protein Liqd Take 330 mLs (11 oz total) by mouth 2 (two) times daily.   escitalopram 10 MG tablet Commonly known as: LEXAPRO Take 10 mg by mouth daily.   Farxiga 10 MG Tabs tablet Generic drug: dapagliflozin propanediol Take 10 mg by mouth daily.   FreeStyle Libre 14 Day Sensor Misc 1 patch every 14 (fourteen) days.   furosemide 40 MG tablet Commonly known as: LASIX Take 40 mg by mouth daily.   insulin aspart 100 UNIT/ML injection Commonly known as: novoLOG Inject 4 Units into the  skin 3 (three) times daily with meals.   insulin glargine 100 UNIT/ML injection Commonly known as: LANTUS Inject 0.12 mLs (12 Units total) into the skin at bedtime.   iron polysaccharides 150 MG capsule Commonly known as: NIFEREX Take 150 mg by mouth daily with lunch.   LORazepam 1 MG tablet Commonly known as: ATIVAN Take 1 mg by mouth at bedtime.   magnesium oxide 400 MG tablet Commonly known as: MAG-OX TAKE 1 CAPSULE BY MOUTH ONCE DAILY What changed: how much to take   metoprolol tartrate 50 MG tablet Commonly known as: LOPRESSOR Take 25 mg by mouth daily.   multivitamin with minerals Tabs tablet Take 1 tablet by mouth daily.   nystatin powder Commonly known as: MYCOSTATIN/NYSTOP Apply topically 2 (two) times daily.   ondansetron 4 MG tablet Commonly known as: ZOFRAN TAKE 1 TABLET BY MOUTH EVERY 8 HOURS AS NEEDED FOR NAUSEA AND VOMITING What changed:   how much to take  how to take this  when to take this  reasons to take this  additional instructions   pantoprazole 40 MG tablet Commonly known as: PROTONIX Take 1 tablet (40 mg total) by mouth 2 (two) times daily. What changed:  when to take this   PATADAY OP Place 1 drop into both eyes daily as needed (allergies).   polyethylene glycol powder 17 GM/SCOOP powder Commonly known as: GLYCOLAX/MIRALAX Take 17 g by mouth daily as needed for moderate constipation.   PROBIOTIC PO Take 1 capsule by mouth daily.   Promethazine VC/Codeine 6.25-5-10 MG/5ML Syrp Take 5 mLs by mouth every 6 (six) hours as needed for cough.   SANTYL EX Apply 1 application topically 2 (two) times daily.   traMADol 50 MG tablet Commonly known as: ULTRAM Take 1 tablet (50 mg total) by mouth every 6 (six) hours as needed for severe pain or moderate pain.   vitamin C with rose hips 500 MG tablet Take 500 mg by mouth daily.   zinc sulfate 220 (50 Zn) MG capsule Take 220 mg by mouth daily.   ZzzQuil 25 MG Caps Generic drug:  diphenhydrAMINE HCl (Sleep) Take 25 mg by mouth at bedtime as needed (sleep).            Discharge Care Instructions  (From admission, onward)         Start     Ordered   03/26/21 0000  Discharge wound care:       Comments: May cleanse amputation stump daily with antibacterial soap and water.  Apply clean dry dressing.  Can get stump wet but not soak it.  Patient will get protector but does not need to wear it in bed   03/26/21 1018          Follow-up Information    Persons, Bevely Palmer, PA In 1 week.   Specialty: Orthopedic Surgery Contact information: [address]  [place] [address] [phone]              Allergies  Allergen Reactions  . Codeine Hives and Itching  . Ofev [Nintedanib] Diarrhea    SEVERE DIARRHEA  . Pirfenidone Diarrhea and Other (See Comments)    Esbriet (Pirfenidone) causes elevated LFTs. D/C on 06/14/17 and SEVERE DIARRHEA     Consultations:  PCCM  Orthopedic surgery   Procedures/Studies: DG CHEST PORT 1 VIEW  Result Date: 03/24/2021 CLINICAL DATA:  Shortness of breath EXAM: PORTABLE CHEST 1 VIEW COMPARISON:  12/16/2020 FINDINGS: Cardiac shadow is enlarged but stable. Postsurgical changes are noted. Diffuse vascular congestion is again noted likely a portion of which is chronic in nature. Diffuse fibrotic changes are noted worse on the left than the right. No acute bony abnormality is seen. Postsurgical changes in the cervical spine are noted. IMPRESSION: Chronic appearing vascular congestion and fibrotic changes similar to that seen on the prior study. No acute abnormality noted. Electronically Signed   By: Inez Catalina M.D.   On: 03/24/2021 18:49   ECHOCARDIOGRAM COMPLETE  Result Date: 03/19/2021    ECHOCARDIOGRAM REPORT   Patient Name:   Shane [name] Date of Exam: 03/19/2021 Medical Rec #:  [id number]        Height:       72.0 in Accession #:    [id number]       Weight:       235.4 lb Date of Birth:  14-Jul-1941        BSA:           2.284 m Patient Age:    23 years         BP:           101/66 mmHg Patient Gender: M  HR:           54 bpm. Exam Location:  Inpatient Procedure: 2D Echo, Cardiac Doppler, Color Doppler and Intracardiac            Opacification Agent Indications:    Atrial fibrillation  History:        Patient has prior history of Echocardiogram examinations, most                 recent 12/11/2020. CAD, Prior CABG, Aortic Valve Disease; Risk                 Factors:Sleep Apnea, Hypertension and Diabetes. S/p pacemaker                 removal. CKD. Second degree AV block.  Sonographer:    Clayton Lefort RDCS (AE) Referring Phys: [place]  1. Left ventricular ejection fraction, by estimation, is 50 to 55%. The left ventricle has low normal function. The left ventricle has no regional wall motion abnormalities. There is moderate concentric left ventricular hypertrophy. Left ventricular diastolic function could not be evaluated.  2. Right ventricular systolic function is moderately reduced. The right ventricular size is mildly enlarged. There is moderately elevated pulmonary artery systolic pressure.  3. Left atrial size was mild to moderately dilated.  4. Right atrial size was moderately dilated.  5. The mitral valve is normal in structure. Mild mitral valve regurgitation. Moderate mitral annular calcification.  6. The aortic valve is calcified. There is moderate calcification of the aortic valve. There is moderate thickening of the aortic valve. Aortic valve regurgitation is not visualized. Mild to moderate aortic valve stenosis. Aortic valve mean gradient measures 20.5 mmHg.  7. Aortic dilatation noted. There is mild dilatation of the ascending aorta, measuring 41 mm.  8. The inferior vena cava is dilated in size with <50% respiratory variability, suggesting right atrial pressure of 15 mmHg. Comparison(s): No significant change from prior study. Compared to prior TEE 12/2020. FINDINGS  Left  Ventricle: Left ventricular ejection fraction, by estimation, is 50 to 55%. The left ventricle has low normal function. The left ventricle has no regional wall motion abnormalities. Definity contrast agent was given IV to delineate the left ventricular endocardial borders. The left ventricular internal cavity size was normal in size. There is moderate concentric left ventricular hypertrophy. Abnormal (paradoxical) septal motion consistent with post-operative status. Left ventricular diastolic function could not be evaluated due to atrial fibrillation. Left ventricular diastolic function could not be evaluated. Right Ventricle: The right ventricular size is mildly enlarged. Right vetricular wall thickness was not well visualized. Right ventricular systolic function is moderately reduced. There is moderately elevated pulmonary artery systolic pressure. The tricuspid regurgitant velocity is 3.18 m/s, and with an assumed right atrial pressure of 15 mmHg, the estimated right ventricular systolic pressure is 29.4 mmHg. Left Atrium: Left atrial size was mild to moderately dilated. Right Atrium: Right atrial size was moderately dilated. Pericardium: There is no evidence of pericardial effusion. Mitral Valve: The mitral valve is normal in structure. Moderate mitral annular calcification. Mild mitral valve regurgitation. Tricuspid Valve: The tricuspid valve is normal in structure. Tricuspid valve regurgitation is trivial. No evidence of tricuspid stenosis. Aortic Valve: The aortic valve is calcified. There is moderate calcification of the aortic valve. There is moderate thickening of the aortic valve. Aortic valve regurgitation is not visualized. Mild to moderate aortic stenosis is present. Aortic valve mean gradient measures 20.5 mmHg. Aortic valve peak gradient measures 36.4 mmHg.  Aortic valve area, by VTI measures 0.64 cm. Pulmonic Valve: The pulmonic valve was not well visualized. Pulmonic valve regurgitation is not  visualized. Aorta: Aortic dilatation noted. There is mild dilatation of the ascending aorta, measuring 41 mm. Venous: The inferior vena cava is dilated in size with less than 50% respiratory variability, suggesting right atrial pressure of 15 mmHg. IAS/Shunts: The interatrial septum was not well visualized.  LEFT VENTRICLE PLAX 2D LVIDd:         5.10 cm LVIDs:         3.50 cm LV PW:         1.40 cm LV IVS:        1.40 cm LVOT diam:     2.00 cm LV SV:         43 LV SV Index:   19 LVOT Area:     3.14 cm  RIGHT VENTRICLE            IVC RV Basal diam:  3.70 cm    IVC diam: 2.30 cm RV S prime:     7.81 cm/s TAPSE (M-mode): 1.2 cm LEFT ATRIUM             Index       RIGHT ATRIUM           Index LA diam:        4.50 cm 1.97 cm/m  RA Area:     26.70 cm LA Vol (A2C):   52.4 ml 22.95 ml/m RA Volume:   87.00 ml  38.10 ml/m LA Vol (A4C):   72.1 ml 31.57 ml/m LA Biplane Vol: 66.3 ml 29.03 ml/m  AORTIC VALVE AV Area (Vmax):    0.64 cm AV Area (Vmean):   0.65 cm AV Area (VTI):     0.64 cm AV Vmax:           301.50 cm/s AV Vmean:          210.750 cm/s AV VTI:            0.675 m AV Peak Grad:      36.4 mmHg AV Mean Grad:      20.5 mmHg LVOT Vmax:         61.12 cm/s LVOT Vmean:        43.700 cm/s LVOT VTI:          0.138 m LVOT/AV VTI ratio: 0.20  AORTA Ao Root diam: 3.85 cm Ao Asc diam:  4.10 cm TRICUSPID VALVE TR Peak grad:   40.4 mmHg TR Vmax:        318.00 cm/s  SHUNTS Systemic VTI:  0.14 m Systemic Diam: 2.00 cm Buford Dresser MD Electronically signed by Buford Dresser MD Signature Date/Time: 03/19/2021/12:33:49 PM    Final     (Echo, Carotid, EGD, Colonoscopy, ERCP)    Subjective: Patient seen and examined.  No complaints at rest.  Does not want to use any oxycodone as it gives him some nausea. No other overnight events.  Pain is controlled. Looking forward to go to rehab. He has some exertional shortness of breath due to underlying pulmonary fibrosis which is at about baseline as per the  patient.  No family at bedside.   Discharge Exam: Vitals:   03/26/21 0525 03/26/21 0845  BP: 116/69 (!) 108/54  Pulse: 60 (!) 58  Resp: 14   Temp: 98.5 F (36.9 C)   SpO2: 98% 100%   Vitals:   03/25/21 1737 03/25/21 2000 03/26/21 0525 03/26/21 0845  BP: (!) 110/55 109/76 116/69 (!) 108/54  Pulse: 60 62 60 (!) 58  Resp: 20 20 14    Temp: 98 F (36.7 C) 98 F (36.7 C) 98.5 F (36.9 C)   TempSrc: Oral Oral Oral   SpO2: 100% 97% 98% 100%  Weight:      Height:        General: Pt is alert, awake, not in acute distress, on room air. Cardiovascular: RRR, S1/S2 +, no rubs, no gallops, no added sounds. Respiratory: Bilateral equal air entry.  He has both inspiratory and expiratory crackles at the bases and posterior lung field consistent with his interstitial disease. Abdominal: Soft, NT, ND, bowel sounds + Extremities: no edema, no cyanosis Right below-knee amputation stump on compression bandage, clean and dry.    The results of significant diagnostics from this hospitalization (including imaging, microbiology, ancillary and laboratory) are listed below for reference.     Microbiology: Recent Results (from the past 240 hour(s))  SARS CORONAVIRUS 2 (TAT 6-24 HRS) Nasopharyngeal Nasopharyngeal Swab     Status: None   Collection Time: 03/16/21  1:32 PM   Specimen: Nasopharyngeal Swab  Result Value Ref Range Status   SARS Coronavirus 2 NEGATIVE NEGATIVE Final    Comment: (NOTE) SARS-CoV-2 target nucleic acids are NOT DETECTED.  The SARS-CoV-2 RNA is generally detectable in upper and lower respiratory specimens during the acute phase of infection. Negative results do not preclude SARS-CoV-2 infection, do not rule out co-infections with other pathogens, and should not be used as the sole basis for treatment or other patient management decisions. Negative results must be combined with clinical observations, patient history, and epidemiological information. The expected result  is Negative.  Fact Sheet for Patients: SugarRoll.be  Fact Sheet for Healthcare Providers: https://www.woods-mathews.com/  This test is not yet approved or cleared by the [place] FDA and  has been authorized for detection and/or diagnosis of SARS-CoV-2 by FDA under an Emergency Use Authorization (EUA). This EUA will remain  in effect (meaning this test can be used) for the duration of the COVID-19 declaration under Se ction 564(b)(1) of the Act, 21 U.S.C. section 360bbb-3(b)(1), unless the authorization is terminated or revoked sooner.  Performed at [facility], [place] [address], [place], [place] [address]   MRSA PCR Screening     Status: None   Collection Time: 03/18/21  5:04 PM   Specimen: Nasopharyngeal  Result Value Ref Range Status   MRSA by PCR NEGATIVE NEGATIVE Final    Comment:        The GeneXpert MRSA Assay (FDA approved for NASAL specimens only), is one component of a comprehensive MRSA colonization surveillance program. It is not intended to diagnose MRSA infection nor to guide or monitor treatment for MRSA infections. Performed at [facility], [place] [address], [place], [place] [address]      Labs: BNP (last 3 results) Recent Labs    07/17/20 1438 12/08/20 1805  BNP 210.9* 326.7*   Basic Metabolic Panel: Recent Labs  Lab 03/20/21 0046 03/21/21 0312 03/26/21 0409  NA 130* 129* 131*  K 4.5 4.5 5.0  CL 94* 94* 98  CO2 27 28 28   GLUCOSE 168* 152* 117*  BUN 35* 31* 22  CREATININE 1.57* 1.35* 1.12  CALCIUM 8.0* 8.0* 8.1*  MG  --  2.0 1.9  PHOS  --   --  3.3   Liver Function Tests: No results for input(s): AST, ALT, ALKPHOS, BILITOT, PROT, ALBUMIN in the last 168  hours. No results for input(s): LIPASE, AMYLASE in the last 168 hours. No results for input(s): AMMONIA in the last 168 hours. CBC: Recent Labs  Lab 03/20/21 0046 03/21/21 0312 03/22/21 0828 03/26/21 0409  WBC 10.0 10.0  --   7.9  NEUTROABS  --  6.1  --  4.2  HGB 7.3* 8.5* 8.6* 8.0*  HCT 22.5* 26.5* 26.5* 25.4*  MCV 87.2 89.2  --  89.1  PLT 207 213  --  292   Cardiac Enzymes: No results for input(s): CKTOTAL, CKMB, CKMBINDEX, TROPONINI in the last 168 hours. BNP: Invalid input(s): POCBNP CBG: Recent Labs  Lab 03/25/21 0804 03/25/21 1146 03/25/21 1741 03/25/21 2103 03/26/21 0803  GLUCAP 136* 116* 90 170* 116*   D-Dimer No results for input(s): DDIMER in the last 72 hours. Hgb A1c No results for input(s): HGBA1C in the last 72 hours. Lipid Profile No results for input(s): CHOL, HDL, LDLCALC, TRIG, CHOLHDL, LDLDIRECT in the last 72 hours. Thyroid function studies No results for input(s): TSH, T4TOTAL, T3FREE, THYROIDAB in the last 72 hours.  Invalid input(s): FREET3 Anemia work up No results for input(s): VITAMINB12, FOLATE, FERRITIN, TIBC, IRON, RETICCTPCT in the last 72 hours. Urinalysis    Component Value Date/Time   COLORURINE YELLOW 12/08/2020 1954   APPEARANCEUR CLEAR 12/08/2020 1954   LABSPEC 1.014 12/08/2020 1954   PHURINE 5.0 12/08/2020 1954   GLUCOSEU NEGATIVE 12/08/2020 1954   HGBUR NEGATIVE 12/08/2020 [place] NEGATIVE 12/08/2020 [place] NEGATIVE 12/08/2020 1954   PROTEINUR 100 (A) 12/08/2020 1954   UROBILINOGEN 1.0 09/27/2011 1112   NITRITE NEGATIVE 12/08/2020 1954   LEUKOCYTESUR NEGATIVE 12/08/2020 1954   Sepsis Labs Invalid input(s): PROCALCITONIN,  WBC,  LACTICIDVEN Microbiology Recent Results (from the past 240 hour(s))  SARS CORONAVIRUS 2 (TAT 6-24 HRS) Nasopharyngeal Nasopharyngeal Swab     Status: None   Collection Time: 03/16/21  1:32 PM   Specimen: Nasopharyngeal Swab  Result Value Ref Range Status   SARS Coronavirus 2 NEGATIVE NEGATIVE Final    Comment: (NOTE) SARS-CoV-2 target nucleic acids are NOT DETECTED.  The SARS-CoV-2 RNA is generally detectable in upper and lower respiratory specimens during the acute phase of infection.  Negative results do not preclude SARS-CoV-2 infection, do not rule out co-infections with other pathogens, and should not be used as the sole basis for treatment or other patient management decisions. Negative results must be combined with clinical observations, patient history, and epidemiological information. The expected result is Negative.  Fact Sheet for Patients: SugarRoll.be  Fact Sheet for Healthcare Providers: https://www.woods-mathews.com/  This test is not yet approved or cleared by the [place] FDA and  has been authorized for detection and/or diagnosis of SARS-CoV-2 by FDA under an Emergency Use Authorization (EUA). This EUA will remain  in effect (meaning this test can be used) for the duration of the COVID-19 declaration under Se ction 564(b)(1) of the Act, 21 U.S.C. section 360bbb-3(b)(1), unless the authorization is terminated or revoked sooner.  Performed at [facility], [place] [address], [place], [place] [address]   MRSA PCR Screening     Status: None   Collection Time: 03/18/21  5:04 PM   Specimen: Nasopharyngeal  Result Value Ref Range Status   MRSA by PCR NEGATIVE NEGATIVE Final    Comment:        The GeneXpert MRSA Assay (FDA approved for NASAL specimens only), is one component of a comprehensive MRSA colonization surveillance program. It is not intended to  diagnose MRSA infection nor to guide or monitor treatment for MRSA infections. Performed at [facility], [place] [address] [place], [place] [address]      Time coordinating discharge:  40 minutes  SIGNED:   Barb Merino, MD  [facility] 03/26/2021, 10:18 AM

## 2021-03-26 NOTE — Progress Notes (Signed)
Patient arrived to unit at 1257 and oriented to unit to policy and procedure.  INPATIENT REHABILITATION ADMISSION NOTE   Arrival Method: via bed      Mental Orientation: A&Ox 4    Assessment: done    Skin: pressure ulcer on foot and redness to bottom    IV'S: 20 gauge IV in right forearm    Pain: no c/o pain    Tubes and Drains: N/A   Safety Measures: call dont fall policy and side rails up    Vital Signs: done    Height and Weight: 6 foot and 103.7 kg on admission    Rehab Orientation: oereinted to policies of unit and rehab schedules    Family: Fritz Pickerel, pt  son at bedside on admission     Notes:

## 2021-03-26 NOTE — H&P (Signed)
Physical Medicine and Rehabilitation Admission H&P     HPI: Laquentin Loudermilk. 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  History taken from chart review and patient.  Patient lives with spouse.  Two-level home bed and bath main level.  Ramped entrance.  Independent with assistive device.  Wife does assist with some ADLs.  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 Sharol Given with wound VAC applied.  Hospital was complicated by acute blood loss anemia.  Postoperative anemia 6.0.  He was transfused 2 units packed red blood cells with latest hemoglobin 8.0.  His renal function remained stable with latest creatinine 1.12.  Patient did have some orthostasis requiring norepinephrine.  His chronic Eliquis remained on hold after BKA until 03/24/2021 and resumed.  Echocardiogram on 03/20/2019 showed ejection fraction of 50-55%, no wall motion abnormalities.  Wound VAC was removed 03/24/2021.  Therapy evaluations completed due to patient decreased functional mobility was admitted for a comprehensive rehab program.  Please see preadmission assessment from earlier today as well.  Review of Systems  Constitutional: Negative for chills and fever.  HENT: Negative for hearing loss.   Eyes: Negative for blurred vision and double vision.  Respiratory: Positive for shortness of breath (Baseline). Negative for cough.        Shortness  of breath with exertion  Cardiovascular: Positive for leg swelling. Negative for chest pain and palpitations.  Gastrointestinal: Negative for constipation, heartburn, nausea and vomiting.       GERD  Genitourinary: Positive for urgency. Negative for dysuria, flank pain and hematuria.  Musculoskeletal: Positive for back pain, joint pain and myalgias.  Skin: Negative for rash.  Neurological: Positive for sensory change and weakness.  Psychiatric/Behavioral: Positive for depression. The patient has insomnia.        Anxiety  All other systems reviewed and are negative.  Past Medical History:  Diagnosis Date  . Anxiety   . 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 Cataract   . Chronic kidney disease    d/t DM  . CKD (chronic kidney disease), stage III ([place])   . Depression   . Diabetes mellitus    Vgo disposal insulin bolus  simular to insulin pump  . Dyspnea   . GERD (gastroesophageal reflux disease)   . History of kidney stones    passed  . Hyperlipidemia   . Hypertension   . Idiopathic pulmonary fibrosis ([place]) 11/2016  . ILD (interstitial lung disease) ([place])   . Moderate aortic stenosis    a. 10/2019 Echo: EF 55-60%, Gr2 DD. Nl RV.   Marland Kitchen Neuromuscular disorder ([place])   . Neuropathy associated with endocrine disorder ([place])   . Nonobstructive CAD (coronary artery disease)    a. 2012 Cath: mod, nonobs dzs; b. 10/2016 MV: EF 60%, no ischemia.  . OSA on CPAP 05/05/2017   Unattended Home Sleep Test 7/2/813-AHI 38.6/hour, desaturation to 64%, body weight 261 pounds  . PONV (postoperative nausea and vomiting)   . Sleep apnea  uses cpap asked to bring mask and tubing   Past Surgical History:  Procedure Laterality Date  . ABDOMINAL AORTOGRAM W/LOWER EXTREMITY N/A 12/10/2020   Procedure: ABDOMINAL AORTOGRAM W/LOWER EXTREMITY;  Surgeon: Cherre Robins, MD;  Location: [place] [facility];  Service: Cardiovascular;  Laterality: N/A;  . AMPUTATION Right 01/22/2021   Procedure: 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;   Surgeon: [name], MD;  Location: [place];  Service: Orthopedics;  Laterality: Right;  . AMPUTATION Right 03/17/2021   Procedure: RIGHT BELOW KNEE AMPUTATION;  Surgeon: [name], MD;  Location: [place];  Service: Orthopedics;  Laterality: Right;  . ANKLE FUSION Right 01/22/2021   Procedure: RIGHT FOOT TIBIOCALCANEAL FUSION;  Surgeon: [name], MD;  Location: [place];  Service: Orthopedics;  Laterality: Right;  . ANTERIOR FUSION CERVICAL SPINE  2012  . CARDIAC CATHETERIZATION  2011  . CARDIAC CATHETERIZATION N/A 11/09/2016   Procedure: Right Heart Cath;  Surgeon: Belva Crome, MD;  Location: [place] [facility];  Service: Cardiovascular;  Laterality: N/A;  . carpel tunnel     left wrist  . CATARACT EXTRACTION    . CATARACT EXTRACTION W/ INTRAOCULAR LENS  IMPLANT, BILATERAL  2013  . CERVICAL LAMINECTOMY  2012  . COLONOSCOPY N/A 01/14/2013   Procedure: COLONOSCOPY;  Surgeon: Irene Shipper, MD;  Location: [facility];  Service: Endoscopy;  Laterality: N/A;  . CORONARY ARTERY BYPASS GRAFT  11/04/2020   LIMA-LAD, SVG-OM1, SVG-PDA (Dr Marney [facility]) dc 11/18/2020  . EYE SURGERY    . I & D EXTREMITY Right 02/19/2021   Procedure: RIGHT ANKLE DEBRIDEMENT AND PLACEMENT ANTIBIOTIC BEADS;  Surgeon: [name], MD;  Location: [place];  Service: Orthopedics;  Laterality: Right;  . [place]   left  . LEFT HEART CATH AND CORONARY ANGIOGRAPHY N/A 07/10/2020   Procedure: LEFT HEART CATH AND CORONARY ANGIOGRAPHY;  Surgeon: Sherren Mocha, MD;  Location: [place] [facility];  Service: Cardiovascular;  Laterality: N/A;  . LUMBAR LAMINECTOMY  2003  . LUNG BIOPSY Left 12/26/2016   Procedure: LUNG BIOPSY;  Surgeon: [name], MD;  Location: [place];  Service: Thoracic;  Laterality: Left;  . PACEMAKER IMPLANT N/A 03/30/2020   Procedure: PACEMAKER IMPLANT;  Surgeon: Evans Lance, MD;  Location: [place] [facility];  Service: Cardiovascular;  Laterality: N/A;  . PERIPHERAL VASCULAR  INTERVENTION Right 12/10/2020   Procedure: PERIPHERAL VASCULAR INTERVENTION;  Surgeon: Cherre Robins, MD;  Location: [place] [facility];  Service: Cardiovascular;  Laterality: Right;  SFA  . POSTERIOR FUSION CERVICAL SPINE  2012  . PPM GENERATOR REMOVAL N/A 12/14/2020   Procedure: PPM GENERATOR REMOVAL;  Surgeon: Evans Lance, MD;  Location: [place] [facility];  Service: Cardiovascular;  Laterality: N/A;  . TEE WITHOUT CARDIOVERSION N/A 12/11/2020   Procedure: TRANSESOPHAGEAL ECHOCARDIOGRAM (TEE);  Surgeon: Geralynn Rile, MD;  Location: [place];  Service: Cardiovascular;  Laterality: N/A;  . TRIGGER FINGER RELEASE  2011   4th finger left hand  . VIDEO ASSISTED THORACOSCOPY Left 12/26/2016   Procedure: VIDEO ASSISTED THORACOSCOPY;  Surgeon: [name], MD;  Location: [place];  Service: Thoracic;  Laterality: Left;  Marland Kitchen VIDEO BRONCHOSCOPY N/A 12/26/2016   Procedure: VIDEO BRONCHOSCOPY;  Surgeon: [name], MD;  Location: [facility];  Service: Thoracic;  Laterality: N/A;   Family History  Problem Relation Age of Onset  . Diabetes Mellitus II Mother   . Emphysema Father 9  .  Heart attack Father   . Colon cancer Neg Hx   . Esophageal cancer Neg Hx   . Rectal cancer Neg Hx   . Stomach cancer Neg Hx    Social History:  reports that he has never smoked. He has never used smokeless tobacco. He reports that he does not drink alcohol and does not use drugs. Allergies:  Allergies  Allergen Reactions  . Codeine Hives and Itching  . Ofev [Nintedanib] Diarrhea    SEVERE DIARRHEA  . Pirfenidone Diarrhea and Other (See Comments)    Esbriet (Pirfenidone) causes elevated LFTs. D/C on 06/14/17 and SEVERE DIARRHEA    Medications Prior to Admission  Medication Sig Dispense Refill  . albuterol (VENTOLIN HFA) 108 (90 Base) MCG/ACT inhaler Inhale 2 puffs into the lungs every 4 (four) hours as needed for wheezing or shortness of breath.    Marland Kitchen apixaban (ELIQUIS) 5 MG TABS tablet  Take 1 tablet (5 mg total) by mouth 2 (two) times daily. 60 tablet 11  . Ascorbic Acid (VITAMIN C WITH ROSE HIPS) 500 MG tablet Take 500 mg by mouth daily.    Marland Kitchen aspirin EC 81 MG tablet Take 81 mg by mouth daily.    Marland Kitchen atorvastatin (LIPITOR) 40 MG tablet Take 20 mg by mouth in the morning and at bedtime.    . [EXPIRED] ciprofloxacin (CIPRO) 500 MG tablet Take 1 tablet (500 mg total) by mouth 2 (two) times daily. 60 tablet 0  . Collagenase (SANTYL EX) Apply 1 application topically 2 (two) times daily.    . Continuous Blood Gluc Sensor (FREESTYLE LIBRE 14 DAY SENSOR) MISC 1 patch every 14 (fourteen) days.    . diclofenac (VOLTAREN) 75 MG EC tablet Take 75 mg by mouth 2 (two) times daily as needed for pain.    . diphenhydrAMINE HCl, Sleep, (ZZZQUIL) 25 MG CAPS Take 25 mg by mouth at bedtime as needed (sleep).    . Ensure Max Protein (ENSURE MAX PROTEIN) LIQD Take 330 mLs (11 oz total) by mouth 2 (two) times daily. 3330 mL 0  . escitalopram (LEXAPRO) 10 MG tablet Take 10 mg by mouth daily.    Marland Kitchen FARXIGA 10 MG TABS tablet Take 10 mg by mouth daily.    . furosemide (LASIX) 40 MG tablet Take 40 mg by mouth daily.    Marland Kitchen HYDROcodone-acetaminophen (NORCO/VICODIN) 5-325 MG tablet Take 1 tablet by mouth every 4 (four) hours as needed. (Patient taking differently: Take 1 tablet by mouth every 4 (four) hours as needed for moderate pain.) 30 tablet 0  . insulin lispro (HUMALOG) 100 UNIT/ML injection Inject 0.07 mLs (7 Units total) into the skin 3 (three) times daily with meals. (Patient taking differently: Inject 10 Units into the skin 3 (three) times daily with meals.) 10 mL 0  . iron polysaccharides (NIFEREX) 150 MG capsule Take 150 mg by mouth daily with lunch.    Marland Kitchen LORazepam (ATIVAN) 1 MG tablet Take 1 mg by mouth at bedtime.    Marland Kitchen losartan (COZAAR) 50 MG tablet Take 25 mg by mouth daily.    . magnesium oxide (MAG-OX) 400 MG tablet TAKE 1 CAPSULE BY MOUTH ONCE DAILY (Patient taking differently: Take 400 mg by  mouth daily.) 90 tablet 1  . metoprolol tartrate (LOPRESSOR) 50 MG tablet Take 25 mg by mouth daily.    . Multiple Vitamin (MULTIVITAMIN WITH MINERALS) TABS tablet Take 1 tablet by mouth daily.    . Olopatadine HCl (PATADAY OP) Place 1 drop into both eyes daily  as needed (allergies).    . ondansetron (ZOFRAN) 4 MG tablet TAKE 1 TABLET BY MOUTH EVERY 8 HOURS AS NEEDED FOR NAUSEA AND VOMITING (Patient taking differently: Take 4 mg by mouth every 8 (eight) hours as needed for nausea or vomiting.) 20 tablet 0  . pantoprazole (PROTONIX) 40 MG tablet Take 40 mg by mouth daily.    . polyethylene glycol powder (GLYCOLAX/MIRALAX) 17 GM/SCOOP powder Take 17 g by mouth daily as needed for moderate constipation.    . Probiotic Product (PROBIOTIC PO) Take 1 capsule by mouth daily.    . Promethazine-Phenyleph-Codeine (PROMETHAZINE VC/CODEINE) 6.25-5-10 MG/5ML SYRP Take 5 mLs by mouth every 6 (six) hours as needed for cough.    . zinc sulfate 220 (50 Zn) MG capsule Take 220 mg by mouth daily.      Drug Regimen Review Drug regimen was reviewed and remains appropriate with no significant issues identified  Home: Home Living Family/patient expects to be discharged to:: Private residence Living Arrangements: Spouse/significant other Available Help at Discharge: Family,Available PRN/intermittently Type of Home: House Home Access: Ramped entrance Home Layout: Two level,Able to live on main level with bedroom/bathroom Bathroom Shower/Tub: Multimedia programmer: Handicapped height Bathroom Accessibility: Yes Home Equipment: Walker - 4 wheels,Cane - single point,Cane - quad,Shower seat,Grab bars - tub/shower,Hand held shower head,Wheelchair - manual,Wheelchair - power   Functional History: Prior Function Level of Independence: Independent with assistive device(s) Comments: using RW and electric w/c to navigate home and community. Ind with most ADLs, states his wife does help with washing back and  buttocks (per recent admission 3 weeks ago); pt reports mostly transfers s/p R ankle I&D due to limited WBing status  Functional Status:  Mobility: Bed Mobility Overal bed mobility: Modified Independent Bed Mobility: Supine to Sit,Sit to Supine Supine to sit: Modified independent (Device/Increase time),HOB elevated Sit to supine: Modified independent (Device/Increase time),HOB elevated General bed mobility comments: use of bedrails Transfers Overall transfer level: Needs assistance Equipment used: Rolling walker (2 wheeled),1 person hand held assist Transfers: Sit to/from Stand Sit to Stand: Min assist,From elevated surface Stand pivot transfers: Min assist Squat pivot transfers: Min assist General transfer comment: Assist for balance to rise. Pt repeated x 6. Pt with placement of rt hand on walker and left hand pushing up from bed Ambulation/Gait Ambulation/Gait assistance: Min assist,+2 safety/equipment Gait Distance (Feet): 3 Feet Assistive device: Rolling walker (2 wheeled) Gait Pattern/deviations:  (pivot on ball of foot, hop-to pattern) General Gait Details: Did not attempt with 1 person assist Gait velocity: slowed Gait velocity interpretation: <1.31 ft/sec, indicative of household ambulator    ADL: ADL Overall ADL's : Needs assistance/impaired Eating/Feeding: Set up,Sitting Grooming: Set up,Sitting Upper Body Bathing: Set up,Sitting Lower Body Bathing: Moderate assistance,Sit to/from stand,+2 for safety/equipment Upper Body Dressing : Set up,Sitting Lower Body Dressing: Moderate assistance,Sit to/from stand Lower Body Dressing Details (indicate cue type and reason): Close to progressing to Min A, problem solved which LE to don first for easier task as pt difficulty reaching L LE. Pt required assist to don around L LE and to maintain balance in standing with RW. Educated on alternating hands and awareness of balance to decrease fall risk Toilet Transfer: Minimal  Dealer Details (indicate cue type and reason): simulated to/from recliner as pt reports difficulty transferring back to bed after therapy sessions. BSC in room likely too small for pt - plan to coordinate to have larger BSC delivered to room Functional mobility during ADLs: Moderate assistance,Rolling walker,Minimal assistance ([place]  for complete transfer, minA for sit to stand and sliding L foot towards HPB) General ADL Comments: Collaborated on safety strategies, compensatory strategies for LB ADLs to decrease fall risk. Pt very engaged in education and demo good safety awareness. Discussed shower setup with OT suggesting pt have shower chair near lip of walk in shower to eliminate need to attempt hopping over lip  Cognition: Cognition Overall Cognitive Status: Within Functional Limits for tasks assessed Orientation Level: Oriented X4 Cognition Arousal/Alertness: Awake/alert Behavior During Therapy: WFL for tasks assessed/performed Overall Cognitive Status: Within Functional Limits for tasks assessed General Comments: educated pt on grieving process associated with amputations  Physical Exam: Blood pressure 116/69, pulse 60, temperature 98.5 F (36.9 C), temperature source Oral, resp. rate 14, height 6' (1.829 m), weight 113.1 kg, SpO2 98 %. Physical Exam Vitals reviewed.  Constitutional:      Appearance: He is normal weight.  HENT:     Head: Normocephalic and atraumatic.     Right Ear: External ear normal.     Left Ear: External ear normal.     Nose: Nose normal.  Eyes:     General:        Right eye: No discharge.        Left eye: No discharge.     Extraocular Movements: Extraocular movements intact.  Cardiovascular:     Comments: Irregularly irregular Pulmonary:     Effort: Pulmonary effort is normal. No respiratory distress.     Breath sounds: No stridor.  Abdominal:     General: Abdomen is flat. Bowel sounds are normal. There is no  distension.  Musculoskeletal:     Cervical back: Normal range of motion and neck supple.     Comments: Right BKA with edema and tenderness  Skin:    Comments: Right BKA with dressing CDI  Neurological:     Mental Status: He is alert.     Comments: Alert and Oriented x3  Motor: Bilateral upper extremities: 5/5 proximal distal Left lower extremity: [place] 10/5 proximal distal Right lower extremity: Hip flexion, knee extension 4+/5 Sensation diminished to light touch distal to mid tibia and left lower extremity  Psychiatric:        Mood and Affect: Mood normal.        Behavior: Behavior normal.     Results for orders placed or performed during the hospital encounter of 03/17/21 (from the past 48 hour(s))  Glucose, capillary     Status: Abnormal   Collection Time: 03/24/21  8:57 AM  Result Value Ref Range   Glucose-Capillary 170 (H) 70 - 99 mg/dL    Comment: Glucose reference range applies only to samples taken after fasting for at least 8 hours.  Glucose, capillary     Status: Abnormal   Collection Time: 03/24/21 11:41 AM  Result Value Ref Range   Glucose-Capillary 180 (H) 70 - 99 mg/dL    Comment: Glucose reference range applies only to samples taken after fasting for at least 8 hours.  Glucose, capillary     Status: Abnormal   Collection Time: 03/24/21  5:24 PM  Result Value Ref Range   Glucose-Capillary 168 (H) 70 - 99 mg/dL    Comment: Glucose reference range applies only to samples taken after fasting for at least 8 hours.  Glucose, capillary     Status: Abnormal   Collection Time: 03/24/21  8:49 PM  Result Value Ref Range   Glucose-Capillary 162 (H) 70 - 99 mg/dL    Comment: Glucose reference  range applies only to samples taken after fasting for at least 8 hours.  Glucose, capillary     Status: Abnormal   Collection Time: 03/25/21  8:04 AM  Result Value Ref Range   Glucose-Capillary 136 (H) 70 - 99 mg/dL    Comment: Glucose reference range applies only to samples taken  after fasting for at least 8 hours.  Glucose, capillary     Status: Abnormal   Collection Time: 03/25/21 11:46 AM  Result Value Ref Range   Glucose-Capillary 116 (H) 70 - 99 mg/dL    Comment: Glucose reference range applies only to samples taken after fasting for at least 8 hours.  Glucose, capillary     Status: None   Collection Time: 03/25/21  5:41 PM  Result Value Ref Range   Glucose-Capillary 90 70 - 99 mg/dL    Comment: Glucose reference range applies only to samples taken after fasting for at least 8 hours.  Glucose, capillary     Status: Abnormal   Collection Time: 03/25/21  9:03 PM  Result Value Ref Range   Glucose-Capillary 170 (H) 70 - 99 mg/dL    Comment: Glucose reference range applies only to samples taken after fasting for at least 8 hours.  CBC with Differential/Platelet     Status: Abnormal   Collection Time: 03/26/21  4:09 AM  Result Value Ref Range   WBC 7.9 4.0 - 10.5 K/uL   RBC 2.85 (L) 4.22 - 5.81 MIL/uL   Hemoglobin 8.0 (L) 13.0 - 17.0 g/dL   HCT 25.4 (L) 39.0 - 52.0 %   MCV 89.1 80.0 - 100.0 fL   MCH 28.1 26.0 - 34.0 pg   MCHC 31.5 30.0 - 36.0 g/dL   RDW 14.6 11.5 - 15.5 %   Platelets 292 150 - 400 K/uL   nRBC 0.0 0.0 - 0.2 %   Neutrophils Relative % 53 %   Neutro Abs 4.2 1.7 - 7.7 K/uL   Lymphocytes Relative 25 %   Lymphs Abs 2.0 0.7 - 4.0 K/uL   Monocytes Relative 11 %   Monocytes Absolute 0.8 0.1 - 1.0 K/uL   Eosinophils Relative 10 %   Eosinophils Absolute 0.8 (H) 0.0 - 0.5 K/uL   Basophils Relative 1 %   Basophils Absolute 0.1 0.0 - 0.1 K/uL   Immature Granulocytes 0 %   Abs Immature Granulocytes 0.03 0.00 - 0.07 K/uL    Comment: Performed at [facility], [address], [place], [place] [address]  Magnesium     Status: None   Collection Time: 03/26/21  4:09 AM  Result Value Ref Range   Magnesium 1.9 1.7 - 2.4 mg/dL    Comment: Performed at [facility] [address], [place], [place] [address]  Phosphorus     Status: None    Collection Time: 03/26/21  4:09 AM  Result Value Ref Range   Phosphorus 3.3 2.5 - 4.6 mg/dL    Comment: Performed at [facility] [address], [place], [place] [address]  Basic metabolic panel     Status: Abnormal   Collection Time: 03/26/21  4:09 AM  Result Value Ref Range   Sodium 131 (L) 135 - 145 mmol/L   Potassium 5.0 3.5 - 5.1 mmol/L   Chloride 98 98 - 111 mmol/L   CO2 28 22 - 32 mmol/L   Glucose, Bld 117 (H) 70 - 99 mg/dL    Comment: Glucose reference range applies only to samples taken after  fasting for at least 8 hours.   BUN 22 8 - 23 mg/dL   Creatinine, Ser 1.12 0.61 - 1.24 mg/dL   Calcium 8.1 (L) 8.9 - 10.3 mg/dL   GFR, Estimated >60 >60 mL/min    Comment: (NOTE) Calculated using the CKD-EPI Creatinine Equation (2021)    Anion gap 5 5 - 15    Comment: Performed at [facility] [address], [place], [place] [address]   DG CHEST PORT 1 VIEW  Result Date: 03/24/2021 CLINICAL DATA:  Shortness of breath EXAM: PORTABLE CHEST 1 VIEW COMPARISON:  12/16/2020 FINDINGS: Cardiac shadow is enlarged but stable. Postsurgical changes are noted. Diffuse vascular congestion is again noted likely a portion of which is chronic in nature. Diffuse fibrotic changes are noted worse on the left than the right. No acute bony abnormality is seen. Postsurgical changes in the cervical spine are noted. IMPRESSION: Chronic appearing vascular congestion and fibrotic changes similar to that seen on the prior study. No acute abnormality noted. Electronically Signed   By: Inez Catalina M.D.   On: 03/24/2021 18:49       Medical Problem List and Plan: 1.  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  Wound VAC removed 03/24/2021.  -patient may shower with stump covered  -ELOS/Goals: 12 days/supervision/mod  Admit to CIR 2.  Antithrombotics: -DVT/anticoagulation: Eliquis  -antiplatelet therapy: N/A 3. Pain Management: Neurontin 300 mg 3 times daily,  oxycodone as needed  Monitor with increased exertion, particularly for phantom limb pain 4. Mood: Lexapro 10 mg daily, Ativan 1 mg nightly  -antipsychotic agents: N/A 5. Neuropsych: This patient is capable of making decisions on his own behalf. 6. Skin/Wound Care: Routine skin checks 7. Fluids/Electrolytes/Nutrition: Routine in and outs 8.  Acute blood loss anemia.  Continue iron supplement.    CBC ordered 9.  Hypertension.  Lopressor 12.5 mg twice daily.    Monitor with increased mobility 10.  Diabetes mellitus with hyperglycemia.  Latest hemoglobin A1c 7.8.  NovoLog 4 units 3 times daily with meals, Lantus insulin 12 units nightly.  Check blood sugars before meals and at bedtime  Monitor increase mobility 11.  GERD.  Protonix 12.  Dyslipidemia: Lipitor 13.  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  Pacemaker removed 12/14/2020 due to staph bacteremia.  Follow-up cardiology services 14.  Idiopathic pulmonary fibrosis.  Continue CPAP 15.  Atrial fibrillation.  Cardiac rate controlled.  Continue Eliquis.  Monitor increase activity 16.  CAD with CABG.  No chest pain or increasing shortness of breath 17.  CKD stage III.    CMP ordered  Cathlyn Parsons, PA-C 03/26/2021  I have personally performed a face to face diagnostic evaluation, including, but not limited to relevant history and physical exam findings, of this patient and developed relevant assessment and plan.  Additionally, I have reviewed and concur with the physician assistant's documentation above.  Delice Lesch, MD, ABPMR   The patient's status has not changed. Any changes from the pre-admission screening or documentation from the acute chart are noted above.   Delice Lesch, MD, ABPMR

## 2021-03-26 NOTE — Progress Notes (Signed)
Signed        PMR Admission Coordinator Pre-Admission Assessment   Patient: Shane [name] is an 80 y.o., male MRN: [id number] DOB: [birth year] Height: 6' (182.9 Shane) Weight: 113.1 kg   Insurance Information HMO: yes    PPO:      PCP:      IPA:      80/20:      OTHER:  PRIMARY: UHC Medicare      Policy#: [id number]      Subscriber:  Shane [name]      Phone#: [phone]     Fax#: [phone] Pre-Cert#: [id number] auth for CIR provided by Otila [name] with [facility] Medicare Expedited appeals department with updates due to fax listed above 7 days from admit       Employer:  Benefits:  Phone #: [phone]     Name:  Eff. Date: 12/05/20     Deduct: $0      Out of Pocket Max: $3600 ($3052.16)      Life Max: n/a CIR: $295/day for 1-5      SNF: 20 full days  Outpatient:      Co-Pay: $30/visit Home Health: 100%      Co-Pay:  DME: 80%     Co-Ins: 20% Providers: SECONDARY:       Policy#:      Phone#:    Development worker, community:       Phone#:    The "Data Collection Information Summary" for patients in Inpatient Rehabilitation Facilities with attached "Privacy Act [place] Records" was provided and verbally reviewed with: Patient and Family   Emergency Contact Information         Contact Information     Name Relation Home Work Shane [name] [phone]    Corky Mull Daughter [phone]   [phone]         Current Medical History  Patient Admitting Diagnosis: R BKA   History of Present Illness: Pt is a 80 y/o male admitted for 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  Pt took his eliquis the morning of his surgery, and postoperatively had hemorrhaghic shock requiring transfer to ICU for vasopressors.  Once stabilized returned to floor on room air.  Therapy evaluations were completed  and pt was recommended for CIR.     [facility]'s [facility] record [facility] has been reviewed by the rehabilitation admission coordinator and physician.   Past Medical History  Past Medical History:  Diagnosis Date  . Anxiety    . AV block, Mobitz 1    . Cataract    . Chronic kidney disease      d/t DM  . CKD (chronic kidney disease), stage III ([place])    . Depression    . Diabetes mellitus      Vgo disposal insulin bolus  simular to insulin pump  . Dyspnea    . GERD (gastroesophageal reflux disease)    . History of kidney stones      passed  . Hyperlipidemia    . Hypertension    . Idiopathic pulmonary fibrosis ([place]) 11/2016  . ILD (interstitial lung disease) ([place])    . Moderate aortic stenosis      a. 10/2019 Echo: EF 55-60%, Gr2 DD. Nl RV.   Marland Kitchen Neuromuscular disorder ([place])    . Neuropathy associated with endocrine  disorder ([place])    . Nonobstructive CAD (coronary artery disease)      a. 2012 Cath: mod, nonobs dzs; b. 10/2016 MV: EF 60%, no ischemia.  . OSA on CPAP 05/05/2017    Unattended Home Sleep Test 7/2/813-AHI 38.6/hour, desaturation to 64%, body weight 261 pounds  . PONV (postoperative nausea and vomiting)    . Sleep apnea       uses cpap asked to bring mask and tubing      Family History   family history includes Diabetes Mellitus II in his mother; Emphysema (age of onset: 27) in his father; Heart attack in his father.   Prior Rehab/Hospitalizations Has the patient had prior rehab or hospitalizations prior to admission? Yes   Has the patient had major surgery during 100 days prior to admission? Yes              Current Medications   Current Facility-Administered Medications:  .  0.9 %  sodium chloride infusion, 250 mL, Intravenous, Continuous, Merlene Laughter F, NP .  acetaminophen (TYLENOL) tablet 325-650 mg, 325-650 mg, Oral, Q6H PRN, Persons, Bevely Palmer, [place], 650 mg at 03/25/21 1939 .  albuterol (PROVENTIL) (2.5 MG/3ML) 0.083% nebulizer solution 2.5 mg, 2.5 mg,  Nebulization, Q6H PRN, Barb Merino, MD .  apixaban (ELIQUIS) tablet 5 mg, 5 mg, Oral, BID, Persons, Bevely Palmer, PA, 5 mg at 03/26/21 0846 .  ascorbic acid (VITAMIN C) tablet 1,000 mg, 1,000 mg, Oral, Daily, Persons, Bevely Palmer, PA, 1,000 mg at 03/26/21 0845 .  atorvastatin (LIPITOR) tablet 20 mg, 20 mg, Oral, BID, Persons, Bevely Palmer, PA, 20 mg at 03/26/21 0845 .  bisacodyl (DULCOLAX) EC tablet 5 mg, 5 mg, Oral, Daily PRN, Persons, Bevely Palmer, [place] .  Chlorhexidine Gluconate Cloth 2 % PADS 6 each, 6 each, Topical, Daily, [name], MD, 6 each at 03/26/21 [phone] .  diphenhydrAMINE (BENADRYL) capsule 25 mg, 25 mg, Oral, Q6H PRN, Shalhoub, Sherryll Burger, MD, 25 mg at 03/26/21 0003 .  docusate sodium (COLACE) capsule 100 mg, 100 mg, Oral, Daily, Persons, Bevely Palmer, PA, 100 mg at 03/26/21 0846 .  escitalopram (LEXAPRO) tablet 10 mg, 10 mg, Oral, Daily, Persons, Bevely Palmer, [place], 10 mg at 03/26/21 0845 .  feeding supplement (GLUCERNA SHAKE) (GLUCERNA SHAKE) liquid 237 mL, 237 mL, Oral, TID BM, Freddi Starr, MD, 237 mL at 03/26/21 0844 .  gabapentin (NEURONTIN) capsule 300 mg, 300 mg, Oral, TID, [name], MD, 300 mg at 03/26/21 0846 .  guaiFENesin-dextromethorphan (ROBITUSSIN DM) 100-10 MG/5ML syrup 15 mL, 15 mL, Oral, Q4H PRN, Persons, Bevely Palmer, PA .  hydrALAZINE (APRESOLINE) injection 5 mg, 5 mg, Intravenous, Q20 Min PRN, Persons, Bevely Palmer, PA .  insulin aspart (novoLOG) injection 0-15 Units, 0-15 Units, Subcutaneous, TID WC, Persons, Bevely Palmer, [place], 2 Units at 03/25/21 [phone] .  insulin aspart (novoLOG) injection 4 Units, 4 Units, Subcutaneous, TID WC, Barb Merino, MD, 4 Units at 03/25/21 1304 .  insulin glargine (LANTUS) injection 12 Units, 12 Units, Subcutaneous, QHS, Barb Merino, MD, 12 Units at 03/25/21 2145 .  iron polysaccharides (NIFEREX) capsule 150 mg, 150 mg, Oral, Q lunch, Persons, Bevely Palmer, [place], 150 mg at 03/25/21 1304 .  labetalol (NORMODYNE) injection 10 mg, 10 mg, Intravenous,  Q10 min PRN, Persons, Bevely Palmer, [place] .  LORazepam (ATIVAN) tablet 1 mg, 1 mg, Oral, QHS, Persons, Bevely Palmer, [place], 1 mg at 03/25/21 2145 .  magnesium citrate solution 1 Bottle, 1 Bottle, Oral, Once PRN, Persons, Stanton Kidney  Anne, PA .  magnesium oxide (MAG-OX) tablet 400 mg, 400 mg, Oral, Daily, [name], MD, 400 mg at 03/26/21 0845 .  MEDLINE mouth rinse, 15 mL, Mouth Rinse, q12n4p, Barb Merino, MD, 15 mL at 03/25/21 2147 .  metoprolol tartrate (LOPRESSOR) tablet 12.5 mg, 12.5 mg, Oral, Daily, Ghimire, Kuber, MD, 12.5 mg at 03/26/21 0845 .  multivitamin with minerals tablet 1 tablet, 1 tablet, Oral, Daily, Persons, Bevely Palmer, [place], 1 tablet at 03/26/21 0845 .  nystatin (MYCOSTATIN/NYSTOP) topical powder, , Topical, BID, Shalhoub, Sherryll Burger, MD, Given at 03/26/21 [phone] .  ondansetron (ZOFRAN) injection 4 mg, 4 mg, Intravenous, Q6H PRN, Persons, Bevely Palmer, PA, 4 mg at 03/23/21 0405 .  pantoprazole (PROTONIX) EC tablet 40 mg, 40 mg, Oral, BID, Barb Merino, MD, 40 mg at 03/26/21 0845 .  phenol (CHLORASEPTIC) mouth spray 1 spray, 1 spray, Mouth/Throat, PRN, Persons, Bevely Palmer, PA .  polyethylene glycol (MIRALAX / GLYCOLAX) packet 17 g, 17 g, Oral, Daily PRN, Persons, Bevely Palmer, PA .  potassium chloride SA (KLOR-CON) CR tablet 20-40 mEq, 20-40 mEq, Oral, Daily PRN, Persons, Bevely Palmer, PA .  traMADol Veatrice Bourbon) tablet 50 mg, 50 mg, Oral, Q6H PRN, Barb Merino, MD .  zinc sulfate capsule 220 mg, 220 mg, Oral, Daily, Persons, Bevely Palmer, PA, 220 mg at 03/26/21 0845   Patients Current Diet:     Diet Order                 Diet - low sodium heart healthy              Diet Carb Modified              Diet Carb Modified Fluid consistency: Thin; Room service appropriate? Yes  Diet effective now                      Precautions / Restrictions Precautions Precautions: Fall Precaution Comments: monitor BP Restrictions Weight Bearing Restrictions: Yes RLE Weight Bearing: Non weight bearing    Has  the patient had 2 or more falls or a fall with injury in the past year? No   Prior Activity Level Limited Community (1-2x/wk): was mostly using a scooter due to touble with RLE, able to transfer with RW and short distances with RW mod I, no driving   Prior Functional Level Self Care: Did the patient need help bathing, dressing, using the toilet or eating? Independent   Indoor Mobility: Did the patient need assistance with walking from room to room (with or without device)? Independent   Stairs: Did the patient need assistance with internal or external stairs (with or without device)? Independent   Functional Cognition: Did the patient need help planning regular tasks such as shopping or remembering to take medications? [place] / Equipment Home Assistive Devices/Equipment: CBG Meter,Other (Comment),Electric scooter,Wheelchair,Walker (specify type),Cane (specify quad or straight),Blood pressure cuff,CPAP,Grab bars around toilet,Grab bars in shower,Shower chair with back,Raised toilet seat with rails (continuous glucose monitor) Home Equipment: Walker - 4 wheels,Cane - single point,Cane - quad,Shower seat,Grab bars - tub/shower,Hand held shower head,Wheelchair - manual,Wheelchair - power   Prior Device Use: Indicate devices/aids used by the patient prior to current illness, exacerbation or injury? Manual wheelchair, Motorized wheelchair or scooter and Environmental consultant   Current Functional Level Cognition   Overall Cognitive Status: Within Functional Limits for tasks assessed Orientation Level: Oriented X4 General Comments: educated pt on grieving process associated with amputations  Extremity Assessment (includes Sensation/Coordination)   Upper Extremity Assessment: Generalized weakness (grossly 4-/5)  Lower Extremity Assessment: Defer to PT evaluation RLE Deficits / Details: NPWT to bulbous surgical site, issues with bleeding post op so did not MMT, AROM knee intact  and able to peform hip flexion     ADLs   Overall ADL's : Needs assistance/impaired Eating/Feeding: Set up,Sitting Grooming: Set up,Sitting Upper Body Bathing: Set up,Sitting Lower Body Bathing: Moderate assistance,Sit to/from stand,+2 for safety/equipment Upper Body Dressing : Set up,Sitting Lower Body Dressing: Moderate assistance,Sit to/from stand Lower Body Dressing Details (indicate cue type and reason): Close to progressing to Min A, problem solved which LE to don first for easier task as pt difficulty reaching L LE. Pt required assist to don around L LE and to maintain balance in standing with RW. Educated on alternating hands and awareness of balance to decrease fall risk Toilet Transfer: Minimal Dealer Details (indicate cue type and reason): simulated to/from recliner as pt reports difficulty transferring back to bed after therapy sessions. BSC in room likely too small for pt - plan to coordinate to have larger BSC delivered to room Functional mobility during ADLs: Moderate assistance,Rolling walker,Minimal assistance ([place] for complete transfer, minA for sit to stand and sliding L foot towards HPB) General ADL Comments: Collaborated on safety strategies, compensatory strategies for LB ADLs to decrease fall risk. Pt very engaged in education and demo good safety awareness. Discussed shower setup with OT suggesting pt have shower chair near lip of walk in shower to eliminate need to attempt hopping over lip     Mobility   Overal bed mobility: Modified Independent Bed Mobility: Supine to Sit,Sit to Supine Supine to sit: Modified independent (Device/Increase time),HOB elevated Sit to supine: Modified independent (Device/Increase time),HOB elevated General bed mobility comments: use of bedrails     Transfers   Overall transfer level: Needs assistance Equipment used: Rolling walker (2 wheeled),1 person hand held assist Transfers: Sit to/from Stand Sit to  Stand: Min assist,From elevated surface Stand pivot transfers: Min assist Squat pivot transfers: Min assist General transfer comment: Assist for balance to rise. Pt repeated x 6. Pt with placement of rt hand on walker and left hand pushing up from bed     Ambulation / Gait / Stairs / Wheelchair Mobility   Ambulation/Gait Ambulation/Gait assistance: Min assist,+2 safety/equipment Gait Distance (Feet): 3 Feet Assistive device: Rolling walker (2 wheeled) Gait Pattern/deviations:  (pivot on ball of foot, hop-to pattern) General Gait Details: Did not attempt with 1 person assist Gait velocity: slowed Gait velocity interpretation: <1.31 ft/sec, indicative of household ambulator     Posture / Balance Dynamic Sitting Balance Sitting balance - Comments: noted limitations with dynamic tasks Balance Overall balance assessment: Needs assistance Sitting-balance support: Feet supported,Single extremity supported Sitting balance-Leahy Scale: Fair Sitting balance - Comments: noted limitations with dynamic tasks Standing balance support: Bilateral upper extremity supported Standing balance-Leahy Scale: Poor Standing balance comment: walker and min guard for static standing. Stood x 6 for 45 sec to 2 minutes     Special needs/care consideration CPAP and Wound Vac yes    Previous Home Environment (from acute therapy documentation) Living Arrangements: [name]/significant other Available Help at Discharge: Family,Available PRN/intermittently Type of Home: [employer]: Two level,Able to live on main level with bedroom/bathroom Home Access: Ramped entrance Bathroom Shower/Tub: Walk-in shower Bathroom Toilet: Handicapped height Bathroom Accessibility: Yes   Discharge Living Setting Plans for Discharge Living Setting: Patient's home,Lives with (comment) (wife) Type of  Home at Discharge: House Discharge Home Layout: Able to live on main level with bedroom/bathroom Discharge Home Access: Ramped  entrance Discharge Bathroom Shower/Tub: Walk-in shower Discharge Bathroom Toilet: Handicapped height Discharge Bathroom Accessibility: Yes How Accessible: Accessible via wheelchair Does the patient have any problems obtaining your medications?: No   Social/Family/Support Systems Patient Roles: [name] Anticipated Caregiver: wife, [name], daughter Lattie Haw, and son Fritz Pickerel Anticipated Caregiver's Contact Information: [name] [phone] Ability/Limitations of Caregiver: n/a Caregiver Availability: 24/7 Discharge Plan Discussed with Primary Caregiver: Yes Is Caregiver In Agreement with Plan?: Yes Does Caregiver/Family have Issues with Lodging/Transportation while Pt is in Rehab?: No   Goals Patient/Family Goal for Rehab: PT/OT mod I w/c level, supervision for ambulation Expected length of stay: 10-14 days Pt/Family Agrees to Admission and willing to participate: Yes Program Orientation Provided & Reviewed with Pt/Caregiver Including Roles  & Responsibilities: Yes  Barriers to Discharge: Insurance for SNF coverage   Decrease burden of Care through IP rehab admission: n/a Possible need for SNF placement upon discharge: not anticipated   Patient Condition: I have reviewed medical records from [facility], spoken with Shane, and patient and [name]. I met with patient at the bedside for inpatient rehabilitation assessment.  Patient will benefit from ongoing PT and OT, can actively participate in 3 hours of therapy a day 5 days of the week, and can make measurable gains during the admission.  Patient will also benefit from the coordinated team approach during an Inpatient Acute Rehabilitation admission.  The patient will receive intensive therapy as well as Rehabilitation physician, nursing, social worker, and care management interventions.  Due to safety, skin/wound care, disease management, medication administration, pain management and patient education the patient requires 24 hour  a day rehabilitation nursing.  The patient is currently min A with mobility and Mod A with  basic ADLs.  Discharge setting and therapy post discharge at home with home health is anticipated.  Patient has agreed to participate in the Acute Inpatient Rehabilitation Program and will admit today.   Preadmission Screen Completed By: Shann Medal, PT, DPT with updates by  Bethel Born, 03/26/2021 10:33 AM ______________________________________________________________________   Discussed status with Dr. Posey Pronto on 03/26/21  at 10:33 AM and received approval for admission today.   Admission Coordinator: Shann Medal, PT, DPT with updates by Bethel Born, CCC-SLP, time 10:33 AM Sudie Grumbling 03/26/21     Assessment/Plan: Diagnosis:R BKA   1. Does the need for close, 24 hr/day Medical supervision in concert with the patient's rehab needs make it unreasonable for this patient to be served in a less intensive setting? Yes  2. 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   3. Due to bowel management, safety, skin/wound care, disease management, pain management and patient education, does the patient require 24 hr/day rehab nursing? Yes 4. Does the patient require coordinated care of a physician, rehab nurse, PT, OT to address physical and functional deficits in the context of the above medical diagnosis(es)? Yes Addressing deficits in the following areas: balance, endurance, locomotion, strength, transferring, bathing, dressing, toileting and psychosocial support 5. Can the patient actively participate in an intensive therapy program of at least 3 hrs of therapy 5 days a week? Yes 6. The potential for patient to make measurable gains  while on  inpatient rehab is excellent 7. Anticipated functional outcomes upon discharge from inpatient rehab: modified independent and supervision PT, modified independent and supervision OT, n/a SLP 8. Estimated rehab length of stay to reach the above functional goals is: 8-12 days. 9. Anticipated discharge destination: Home 10. Overall Rehab/Functional Prognosis: good     MD Signature: Delice Lesch, MD, ABPMR

## 2021-03-26 NOTE — Progress Notes (Signed)
Patient ID: Shane [name], male   DOB: [birth year], 80 y.o.   MRN: [id number] Met with the patient and son to review role of the nurse CM and address educational needs related to wound healing and medications. 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 Reviewed Lantus and meal coverage which patient notes he is familiar with but wife manages his medications. Patient given handouts for carbohydrate counting and high triglyceride dietary modifications and reviewed understanding of wearing the residual limb sock and limb guard. No other questions noted at present. Continue to follow along to discharge and address educational needs and questions and collaborate with the SW to facilitate preparations for discharge to promote a smooth transition to the home. Margarito Liner

## 2021-03-26 NOTE — Progress Notes (Signed)
Inpatient Rehabilitation Medication Review 2by a Pharmacist  A complete drug regimen review was completed for this patient to identify any potential clinically significant medication issues.  Clinically significant medication issues were identified:  no  Check AMION for pharmacist assigned to patient if future medication questions/issues arise during this admission.  Pharmacist comments:   Time spent performing this drug regimen review (minutes):  15   Gwen Sarvis 03/26/2021 9:40 PM

## 2021-03-27 DIAGNOSIS — E1169 Type 2 diabetes mellitus with other specified complication: Secondary | ICD-10-CM

## 2021-03-27 DIAGNOSIS — I10 Essential (primary) hypertension: Secondary | ICD-10-CM

## 2021-03-27 DIAGNOSIS — Z89511 Acquired absence of right leg below knee: Secondary | ICD-10-CM

## 2021-03-27 DIAGNOSIS — E669 Obesity, unspecified: Secondary | ICD-10-CM

## 2021-03-27 DIAGNOSIS — G8918 Other acute postprocedural pain: Secondary | ICD-10-CM

## 2021-03-27 LAB — GLUCOSE, CAPILLARY
Glucose-Capillary: 89 mg/dL (ref 70–99)
Glucose-Capillary: 126 mg/dL — ABNORMAL HIGH (ref 70–99)
Glucose-Capillary: 85 mg/dL (ref 70–99)
Glucose-Capillary: 118 mg/dL — ABNORMAL HIGH (ref 70–99)

## 2021-03-27 NOTE — Progress Notes (Signed)
Physical Therapy Session Note  Patient Details  Name: Shane [name] MRN: [id number] Date of Birth: 1941-06-12  Today's Date: 03/27/2021 PT Individual Time: 0932-3557 PT Individual Time Calculation (min): 28 min   Short Term Goals: Week 1:  PT Short Term Goal 1 (Week 1): Pt will perform supine<>sit mod-I PT Short Term Goal 2 (Week 1): Pt will perform sit<>stands using LRAD with CGA PT Short Term Goal 3 (Week 1): Pt will perform bed<>chair transfers using LRAD with CGA PT Short Term Goal 4 (Week 1): Pt will propel wheelchair at least 168ft with supervision PT Short Term Goal 5 (Week 1): Pt will ambulate at least 35ft using LRAD with CGA  Skilled Therapeutic Interventions/Progress Updates:  Pt greeted supine in bed and agreeable to therapy. Did not report any new pain, but pt verbalized sacral "burning" pain earlier in the day when sitting on foam WC cushion. Pt verbalizes that this sacral pain has been present for months. Therapist left room to acquire new ROHO cushion (foam in front with air in back) for pressure relief. Therapist returned to room. Doffed Ted Hose from LLE total assist as MD verbalized that it is only required to be used as needed due to pt's L great toe wound. Pt's personal sock and shoe donned max assist for time management. Pt transferred supine to sitting EOB with complaint of slight dizziness. Pt educated to wait ~1-2 min for symptoms to dissipate. As symptoms dissipated, pt transferred towards L to WC squat pivot CGA. ROHO pressure adjusted for pt's needs, and he verbalized "this feels much better." Pt left sitting in WC with seat belt alarm turned on and needs within reach.  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 Restrictions Weight Bearing Restrictions: Yes RLE Weight Bearing: Non weight bearing    Therapy/Group:  Individual Therapy  Eleonore Chiquito, SPT 03/27/2021, 3:17 PM

## 2021-03-27 NOTE — Progress Notes (Signed)
Occupational Therapy Session Note  Patient Details  Name: Shane [name] MRN: [id number] Date of Birth: May 05, 1941  Today's Date: 03/27/2021 OT Individual Time: 1300-1345 OT Individual Time Calculation (min): 45 min    Short Term Goals: Week 1:  OT Short Term Goal 1 (Week 1): With use of AE, pt will complete LB dressing with min assist OT Short Term Goal 2 (Week 1): Pt will complete toileting hygiene and clothing management with min assist OT Short Term Goal 3 (Week 1): Pt demonstrate dynamic standing balance with CGA during functional ADL/IADL task.  Skilled Therapeutic Interventions/Progress Updates:    1:1. Pt received in bed agreeable to OT. Pt completes sup>sit with S and squat pivot transfers throughotu session with MIN A for level and downhill transfers. Pt educated on w/c parts management with practice of removing LE/arm rests, TTB/discussion of bathroom setup, skin inspection, mirror therapy and limb desensitization. Pt provided with handouts and skin inspection mirror. OT demo use of sock aide and pt abl eto doff and don shoe with no A using reacher as well. Exited session with pt seated in bed, exit alarm on and call light in reach   Therapy Documentation Precautions:  Restrictions Weight Bearing Restrictions: Yes RLE Weight Bearing: Non weight bearing General:   Vital Signs: Therapy Vitals Temp: 98.4 F (36.9 C) Pulse Rate: (!) 58 Resp: 12 BP: 114/60 Patient Position (if appropriate): Lying Oxygen Therapy SpO2: 95 % Pain:   ADL:   Vision   Perception    Praxis   Exercises:   Other Treatments:     Therapy/Group: Individual Therapy  Tonny Branch 03/27/2021, 6:58 AM

## 2021-03-27 NOTE — Evaluation (Signed)
Occupational Therapy Assessment and Plan  Patient Details  Name: Shane [name] MRN: [id number] Date of Birth: [birth year]  OT Diagnosis: muscle weakness (generalized) Rehab Potential: Rehab Potential (ACUTE ONLY): Good ELOS: 10-12 days   Today's Date: 03/27/2021 OT Individual Time: 2440-1027 OT Individual Time Calculation (min): 75 min     Hospital Problem: Principal Problem:   Right below-knee amputee [facility])   Past Medical History:  Past Medical History:  Diagnosis Date  . Anxiety   . 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 Cataract   . Chronic kidney disease    d/t DM  . CKD (chronic kidney disease), stage III ([place])   . Depression   . Diabetes mellitus    Vgo disposal insulin bolus  simular to insulin pump  . Dyspnea   . GERD (gastroesophageal reflux disease)   . History of kidney stones    passed  . Hyperlipidemia   . Hypertension   . Idiopathic pulmonary fibrosis ([place]) 11/2016  . ILD (interstitial lung disease) ([place])   . Moderate aortic stenosis    a. 10/2019 Echo: EF 55-60%, Gr2 DD. Nl RV.   Marland Kitchen Neuromuscular disorder ([place])   . Neuropathy associated with endocrine disorder ([place])   . Nonobstructive CAD (coronary artery disease)    a. 2012 Cath: mod, nonobs dzs; b. 10/2016 MV: EF 60%, no ischemia.  . OSA on CPAP 05/05/2017   Unattended Home Sleep Test 7/2/813-AHI 38.6/hour, desaturation to 64%, body weight 261 pounds  . PONV (postoperative nausea and vomiting)   . Sleep apnea     uses cpap asked to bring mask and tubing   Past Surgical History:  Past Surgical History:  Procedure Laterality Date  . ABDOMINAL AORTOGRAM W/LOWER EXTREMITY N/A 12/10/2020   Procedure: ABDOMINAL AORTOGRAM W/LOWER EXTREMITY;  Surgeon: Cherre Robins, MD;  Location: [place] [facility];  Service: Cardiovascular;  Laterality: N/A;  . AMPUTATION Right 01/22/2021   Procedure: 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;  Surgeon: [name], MD;  Location: [place];  Service: Orthopedics;  Laterality: Right;  . AMPUTATION  Right 03/17/2021   Procedure: RIGHT BELOW KNEE AMPUTATION;  Surgeon: [name], MD;  Location: [place];  Service: Orthopedics;  Laterality: Right;  . ANKLE FUSION Right 01/22/2021   Procedure: RIGHT FOOT TIBIOCALCANEAL FUSION;  Surgeon: [name], MD;  Location: [place];  Service: Orthopedics;  Laterality: Right;  . ANTERIOR FUSION CERVICAL SPINE  2012  . CARDIAC CATHETERIZATION  2011  . CARDIAC CATHETERIZATION N/A 11/09/2016   Procedure: Right Heart Cath;  Surgeon: Belva Crome, MD;  Location: [place] [facility];  Service: Cardiovascular;  Laterality: N/A;  . carpel tunnel     left wrist  . CATARACT EXTRACTION    . CATARACT EXTRACTION W/ INTRAOCULAR LENS  IMPLANT, BILATERAL  2013  . CERVICAL LAMINECTOMY  2012  . COLONOSCOPY N/A 01/14/2013   Procedure: COLONOSCOPY;  Surgeon: Irene Shipper, MD;  Location: [facility];  Service: Endoscopy;  Laterality: N/A;  . CORONARY ARTERY BYPASS GRAFT  11/04/2020   LIMA-LAD, SVG-OM1, SVG-PDA (Dr Marney [facility]) dc 11/18/2020  . EYE SURGERY    . I & D EXTREMITY Right 02/19/2021   Procedure: RIGHT ANKLE DEBRIDEMENT AND PLACEMENT ANTIBIOTIC BEADS;  Surgeon: [name], MD;  Location: [place];  Service: Orthopedics;  Laterality: Right;  . [place]   left  . LEFT HEART CATH AND CORONARY ANGIOGRAPHY N/A 07/10/2020   Procedure: LEFT HEART CATH AND CORONARY ANGIOGRAPHY;  Surgeon: Sherren Mocha, MD;  Location: [place] [facility];  Service: Cardiovascular;  Laterality: N/A;  . LUMBAR LAMINECTOMY  2003  . LUNG BIOPSY Left 12/26/2016   Procedure: LUNG BIOPSY;  Surgeon: [name], MD;  Location: [place];  Service: Thoracic;  Laterality: Left;  . PACEMAKER IMPLANT N/A 03/30/2020   Procedure: PACEMAKER IMPLANT;  Surgeon: Evans Lance, MD;  Location: [place] [facility];  Service: Cardiovascular;  Laterality: N/A;  . PERIPHERAL VASCULAR INTERVENTION Right 12/10/2020   Procedure: PERIPHERAL VASCULAR INTERVENTION;  Surgeon: Cherre Robins, MD;   Location: [place] [facility];  Service: Cardiovascular;  Laterality: Right;  SFA  . POSTERIOR FUSION CERVICAL SPINE  2012  . PPM GENERATOR REMOVAL N/A 12/14/2020   Procedure: PPM GENERATOR REMOVAL;  Surgeon: Evans Lance, MD;  Location: [place] [facility];  Service: Cardiovascular;  Laterality: N/A;  . TEE WITHOUT CARDIOVERSION N/A 12/11/2020   Procedure: TRANSESOPHAGEAL ECHOCARDIOGRAM (TEE);  Surgeon: Geralynn Rile, MD;  Location: [place];  Service: Cardiovascular;  Laterality: N/A;  . TRIGGER FINGER RELEASE  2011   4th finger left hand  . 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  History taken from chart review and patient.  Patient lives with spouse.  Two-level home bed and bath main level.  Ramped entrance.  Independent with assistive device.  Wife does assist with some ADLs.  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 Sharol Given with wound VAC applied.  Hospital was complicated by acute blood loss  anemia.  Postoperative anemia 6.0.  He was transfused 2 units packed red blood cells with latest hemoglobin 8.0.  His renal function remained stable with latest creatinine 1.12.  Patient did have some orthostasis requiring norepinephrine.  His chronic Eliquis remained on hold after BKA until 03/24/2021 and resumed.  Echocardiogram on 03/20/2019 showed ejection fraction of 50-55%, no wall motion abnormalities.  Wound VAC was removed 03/24/2021.  Therapy evaluations completed due to patient decreased functional mobility was admitted for a comprehensive rehab program. Patient transferred to CIR on 03/26/2021 .    Patient currently requires min-max assist with basic self-care skills secondary to muscle weakness and decreased standing balance, decreased postural control and decreased balance strategies.  Prior to hospitalization, patient could complete ADLs with supervision-mod I overall and min assist for dressing/toileting. .  Patient will benefit from skilled intervention to increase independence with basic self-care skills prior to discharge home with care partner.  Anticipate patient will require 24 hour supervision and no further OT follow recommended.  OT - End of Session Endurance Deficit: No OT Assessment Rehab Potential (ACUTE  ONLY): Good OT Patient demonstrates impairments in the following area(s): Balance;Edema;Motor;Pain OT Basic ADL's Functional Problem(s): Grooming;Bathing;Dressing;Toileting OT Transfers Functional Problem(s): Toilet;Tub/Shower OT Additional Impairment(s): None OT Plan OT Intensity: Minimum of 1-2 x/day, 45 to 90 minutes OT Frequency: 5 out of 7 days OT Duration/Estimated Length of Stay: 10-12 days OT Treatment/Interventions: Balance/vestibular training;Disease mangement/prevention;Therapeutic Exercise;Self Care/advanced ADL retraining;Wheelchair propulsion/positioning;DME/adaptive equipment instruction;Pain management;Skin care/wound managment;UE/LE Strength  taining/ROM;Community reintegration;Patient/family education;UE/LE Coordination activities;Discharge planning;Functional mobility training;Psychosocial support;Therapeutic Activities OT Self Feeding Anticipated Outcome(s): N/A OT Basic Self-Care Anticipated Outcome(s): Supervision overall OT Toileting Anticipated Outcome(s): Supervision OT Bathroom Transfers Anticipated Outcome(s): Supervision OT Recommendation Patient destination: Home Follow Up Recommendations: None Equipment Recommended: None recommended by OT Equipment Details: Pt has equipment   OT Evaluation Precautions/Restrictions  Precautions Precautions: Fall Restrictions Weight Bearing Restrictions: Yes RLE Weight Bearing: Non weight bearing General   Vital Signs  Pain Pain Assessment Pain Scale: 0-10 Pain Score: 0-No pain Home Living/Prior Functioning Home Living Available Help at Discharge: Available 24 hours/day Type of Home: House Home Access: Ramped entrance Home Layout: Two level,Able to live on main level with bedroom/bathroom Bathroom Shower/Tub: Multimedia programmer: Handicapped height Bathroom Accessibility: Yes  Lives With: Spouse Prior Function Level of Independence: Needs assistance with ADLs Bath: Minimal Toileting: Minimal Dressing: Minimal  Able to Take Stairs?: No Vision Baseline Vision/History: No visual deficits Patient Visual Report: No change from baseline Vision Assessment?: No apparent visual deficits Perception  Perception: Within Functional Limits Praxis Praxis: Intact Cognition Overall Cognitive Status: Within Functional Limits for tasks assessed Arousal/Alertness: Awake/alert Orientation Level: Person;Place;Situation Person: Oriented Place: Oriented Situation: Oriented Year: 2022 Month: April Day of Week: Correct Memory: Appears intact Immediate Memory Recall: Sock;Blue;Bed Memory Recall Sock: Without Cue Memory Recall Blue: Without Cue Memory Recall Bed:  Without Cue Attention: Selective Selective Attention: Appears intact Awareness: Appears intact Problem Solving: Appears intact Safety/Judgment: Appears intact Sensation Sensation Light Touch: Appears Intact Coordination Gross Motor Movements are Fluid and Coordinated: No Fine Motor Movements are Fluid and Coordinated: Yes Coordination and Movement Description: Limited by amputation Motor  Motor Motor - Skilled Clinical Observations: limited by amputation  Trunk/Postural Assessment  Cervical Assessment Cervical Assessment: Within Functional Limits Thoracic Assessment Thoracic Assessment: Within Functional Limits Lumbar Assessment Lumbar Assessment: Within Functional Limits Postural Control Postural Control: Deficits on evaluation  Balance Balance Balance Assessed: Yes Dynamic Sitting Balance Dynamic Sitting - Balance Support: Feet supported;During functional activity Dynamic Sitting - Level of Assistance: 7: Independent Static Standing Balance Static Standing - Balance Support: Bilateral upper extremity supported Static Standing - Level of Assistance: 5: Stand by assistance Dynamic Standing Balance Dynamic Standing - Balance Support: During functional activity Dynamic Standing - Level of Assistance: 4: Min assist Extremity/Trunk Assessment RUE Assessment RUE Assessment: Within Functional Limits General Strength Comments: 5/5 LUE Assessment LUE Assessment: Within Functional Limits General Strength Comments: 5/5  Care Tool Care Tool Self Care Eating        Oral Care    Oral Care Assist Level: Set up assist    Bathing   Body parts bathed by patient: Right arm;Left arm;Chest;Abdomen;Front perineal area;Right upper leg;Left upper leg;Face Body parts bathed by helper: Left lower leg;Buttocks Body parts n/a: Right lower leg Assist Level: Minimal Assistance - Patient > 75%    Upper Body Dressing(including orthotics)   What is the patient wearing?: Pull over shirt    Assist Level: Set up assist    Lower Body Dressing (excluding footwear)   What is the patient wearing?: Underwear/pull up;Pants Assist for  lower body dressing: Maximal Assistance - Patient 25 - 49%    Putting on/Taking off footwear   What is the patient wearing?: Non-skid slipper socks Assist for footwear: Total Assistance - Patient < 25%       Care Tool Toileting Toileting activity Toileting Activity did not occur (Clothing management and hygiene only): N/A (no void or bm)       Care Tool Bed Mobility Roll left and right activity        Sit to lying activity   Sit to lying assist level: Supervision/Verbal cueing    Lying to sitting edge of bed activity   Lying to sitting edge of bed assist level: Supervision/Verbal cueing     Care Tool Transfers Sit to stand transfer   Sit to stand assist level: Minimal Assistance - Patient > 75%    Chair/bed transfer   Chair/bed transfer assist level: Minimal Assistance - Patient > 75%     Toilet transfer         Care Tool Cognition Expression of Ideas and Wants Expression of Ideas and Wants: Without difficulty (complex and basic) - expresses complex messages without difficulty and with speech that is clear and easy to understand   Understanding Verbal and Non-Verbal Content Understanding Verbal and Non-Verbal Content: Understands (complex and basic) - clear comprehension without cues or repetitions   Memory/Recall Ability *first 3 days only Memory/Recall Ability *first 3 days only: Current season;Location of own room;Staff names and faces;That he or she is in a hospital/hospital unit    Refer to Care Plan for [place] 1 OT Short Term Goal 1 (Week 1): With use of AE, pt will complete LB dressing with min assist OT Short Term Goal 2 (Week 1): Pt will complete toileting hygiene and clothing management with min assist OT Short Term Goal 3 (Week 1): Pt demonstrate dynamic standing balance with CGA during  functional ADL/IADL task.  Recommendations for other services: None    Skilled Therapeutic Intervention OT evaluation completed with wife present throughout. Discussed role of OT, safety plan, rehab schedule, safety plan, precautions, etc. Pt and wife receptive and pt highly motivated. Completed bathing at shower level with min assist for buttocks and LLE. Pt required max A for LB dressing from sit<>stand in w/c at sink. Pt completed stand pivot transfers using RW with min A and primarily scooting foot, rather than hopping. L toe noted to be bleeding following shower with RN notified. At end of session, pt requested to return to bed and left with all needs in reach.   ADL   Mobility  Transfers Sit to Stand: Minimal Assistance - Patient > 75% Stand to Sit: Minimal Assistance - Patient > 75%   Discharge Criteria: Patient will be discharged from OT if patient refuses treatment 3 consecutive times without medical reason, if treatment goals not met, if there is a change in medical status, if patient makes no progress towards goals or if patient is discharged from hospital.  The above assessment, treatment plan, treatment alternatives and goals were discussed and mutually agreed upon: by patient and by family  Duayne Cal 03/27/2021, 8:56 AM

## 2021-03-27 NOTE — Evaluation (Signed)
Physical Therapy Assessment and Plan  Patient Details  Name: Shane [name] MRN: [id number] Date of Birth: 11-Apr-1941  PT Diagnosis: Abnormality of gait, Difficulty walking, Edema, Impaired sensation, Muscle weakness and Pain in R LE Rehab Potential: Good ELOS: 10-12days   Today's Date: 03/27/2021 PT Individual Time: 1115-1209 PT Individual Time Calculation (min): 54 min    Hospital Problem: Principal Problem:   Right below-knee amputee [facility])   Past Medical History:  Past Medical History:  Diagnosis Date  . Anxiety   . 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 Cataract   . Chronic kidney disease    d/t DM  . CKD (chronic kidney disease), stage III ([place])   . Depression   . Diabetes mellitus    Vgo disposal insulin bolus  simular to insulin pump  . Dyspnea   . GERD (gastroesophageal reflux disease)   . History of kidney stones    passed  . Hyperlipidemia   . Hypertension   . 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 ILD (interstitial lung disease) ([place])   . Moderate aortic stenosis    a. 10/2019 Echo: EF 55-60%, Gr2 DD. Nl RV.   Marland Kitchen Neuromuscular disorder ([place])   . Neuropathy associated with endocrine disorder ([place])   . Nonobstructive CAD (coronary artery disease)    a. 2012 Cath: mod, nonobs dzs; b. 10/2016 MV: EF 60%, no ischemia.  . OSA on CPAP 05/05/2017   Unattended Home Sleep Test 7/2/813-AHI 38.6/hour, desaturation to 64%, body weight 261 pounds  . PONV (postoperative nausea and vomiting)   . Sleep apnea     uses cpap asked to bring mask and tubing   Past Surgical History:  Past Surgical History:  Procedure Laterality Date  . ABDOMINAL AORTOGRAM W/LOWER EXTREMITY N/A 12/10/2020   Procedure: ABDOMINAL AORTOGRAM W/LOWER EXTREMITY;  Surgeon: Cherre Robins, MD;  Location: [place] [facility];  Service: Cardiovascular;  Laterality: N/A;  . AMPUTATION Right 01/22/2021   Procedure: 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;  Surgeon: [name], MD;  Location: [place];  Service: Orthopedics;   Laterality: Right;  . AMPUTATION Right 03/17/2021   Procedure: RIGHT BELOW KNEE AMPUTATION;  Surgeon: [name], MD;  Location: [place];  Service: Orthopedics;  Laterality: Right;  . ANKLE FUSION Right 01/22/2021   Procedure: RIGHT FOOT TIBIOCALCANEAL FUSION;  Surgeon: [name], MD;  Location: [place];  Service: Orthopedics;  Laterality: Right;  . ANTERIOR FUSION CERVICAL SPINE  2012  . CARDIAC CATHETERIZATION  2011  . CARDIAC CATHETERIZATION N/A 11/09/2016   Procedure: Right Heart Cath;  Surgeon: Belva Crome, MD;  Location: [place] [facility];  Service: Cardiovascular;  Laterality: N/A;  . carpel tunnel     left wrist  . CATARACT EXTRACTION    . CATARACT EXTRACTION W/ INTRAOCULAR LENS  IMPLANT, BILATERAL  2013  . CERVICAL LAMINECTOMY  2012  . COLONOSCOPY N/A 01/14/2013   Procedure: COLONOSCOPY;  Surgeon: Irene Shipper, MD;  Location: [facility];  Service: Endoscopy;  Laterality: N/A;  . CORONARY ARTERY BYPASS GRAFT  11/04/2020   LIMA-LAD, SVG-OM1, SVG-PDA (Dr Marney [facility]) dc 11/18/2020  . EYE SURGERY    . I & D EXTREMITY Right 02/19/2021   Procedure: RIGHT ANKLE DEBRIDEMENT AND PLACEMENT ANTIBIOTIC BEADS;  Surgeon: [name], MD;  Location: [place];  Service: Orthopedics;  Laterality: Right;  . [place]   left  . LEFT HEART CATH AND CORONARY ANGIOGRAPHY N/A 07/10/2020   Procedure: LEFT  HEART CATH AND CORONARY ANGIOGRAPHY;  Surgeon: Sherren Mocha, MD;  Location: [place] [facility];  Service: Cardiovascular;  Laterality: N/A;  . LUMBAR LAMINECTOMY  2003  . LUNG BIOPSY Left 12/26/2016   Procedure: LUNG BIOPSY;  Surgeon: [name], MD;  Location: [place];  Service: Thoracic;  Laterality: Left;  . PACEMAKER IMPLANT N/A 03/30/2020   Procedure: PACEMAKER IMPLANT;  Surgeon: Evans Lance, MD;  Location: [place] [facility];  Service: Cardiovascular;  Laterality: N/A;  . PERIPHERAL VASCULAR INTERVENTION Right 12/10/2020   Procedure: PERIPHERAL VASCULAR INTERVENTION;   Surgeon: Cherre Robins, MD;  Location: [place] [facility];  Service: Cardiovascular;  Laterality: Right;  SFA  . POSTERIOR FUSION CERVICAL SPINE  2012  . PPM GENERATOR REMOVAL N/A 12/14/2020   Procedure: PPM GENERATOR REMOVAL;  Surgeon: Evans Lance, MD;  Location: [place] [facility];  Service: Cardiovascular;  Laterality: N/A;  . TEE WITHOUT CARDIOVERSION N/A 12/11/2020   Procedure: TRANSESOPHAGEAL ECHOCARDIOGRAM (TEE);  Surgeon: Geralynn Rile, MD;  Location: [place];  Service: Cardiovascular;  Laterality: N/A;  . TRIGGER FINGER RELEASE  2011   4th finger left hand  . VIDEO ASSISTED THORACOSCOPY Left 12/26/2016   Procedure: VIDEO ASSISTED THORACOSCOPY;  Surgeon: [name], MD;  Location: [place];  Service: Thoracic;  Laterality: Left;  Marland Kitchen VIDEO BRONCHOSCOPY N/A 12/26/2016   Procedure: VIDEO BRONCHOSCOPY;  Surgeon: [name], MD;  Location: [facility];  Service: Thoracic;  Laterality: N/A;    Assessment & Plan Clinical Impression: Patient is a 80 y.o. 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  History taken from chart review and patient.  Patient lives with spouse.  Two-level home bed and bath main level.  Ramped entrance.  Independent with assistive device.  Wife does assist with some ADLs.  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 Sharol Given with wound VAC applied.  Hospital was complicated by  acute blood loss anemia.  Postoperative anemia 6.0.  He was transfused 2 units packed red blood cells with latest hemoglobin 8.0.  His renal function remained stable with latest creatinine 1.12.  Patient did have some orthostasis requiring norepinephrine.  His chronic Eliquis remained on hold after BKA until 03/24/2021 and resumed.  Echocardiogram on 03/20/2019 showed ejection fraction of 50-55%, no wall motion abnormalities.  Wound VAC was removed 03/24/2021.  Therapy evaluations completed due to patient decreased functional mobility was admitted for a comprehensive rehab program. Patient transferred to CIR on 03/26/2021 .   Patient currently requires min assist with mobility secondary to muscle weakness and muscle joint tightness, decreased cardiorespiratoy endurance, unbalanced muscle activation and decreased standing balance, decreased postural control, decreased balance strategies and difficulty maintaining precautions.  Prior to hospitalization, patient was modified independent  with mobility at primarily electric scooter level and lived with Spouse (wife, Shane [name]) in a House home.  Home access is  Ramped entrance.  Patient will benefit from skilled PT intervention to maximize safe functional mobility, minimize fall risk and decrease caregiver  burden for planned discharge home with 24 hour supervision.  Anticipate patient will benefit from follow up Manuel Garcia at discharge.  PT - End of Session Activity Tolerance: Tolerates 30+ min activity with multiple rests Endurance Deficit: Yes Endurance Deficit Description: requires frequent seated rest breaks due to SOB PT Assessment Rehab Potential (ACUTE/IP ONLY): Good PT Barriers to Discharge: Decreased caregiver support PT Patient demonstrates impairments in the following area(s): Balance;Perception;Safety;Edema;Sensory;Endurance;Skin Integrity;Motor;Nutrition;Pain PT Transfers Functional Problem(s): Bed Mobility;Bed to Chair;Car;Furniture PT Locomotion  Functional Problem(s): Ambulation;Wheelchair Mobility PT Plan PT Intensity: Minimum of 1-2 x/day ,45 to 90 minutes PT Frequency: 5 out of 7 days PT Duration Estimated Length of Stay: 10-12days PT Treatment/Interventions: Ambulation/gait training;Community reintegration;DME/adaptive equipment instruction;Neuromuscular re-education;Psychosocial support;UE/LE Strength taining/ROM;Wheelchair propulsion/positioning;Balance/vestibular training;Discharge planning;Functional electrical stimulation;Pain management;Skin care/wound management;Therapeutic Activities;UE/LE Coordination activities;Cognitive remediation/compensation;Disease management/prevention;Functional mobility training;Patient/family education;Splinting/orthotics;Therapeutic Exercise;Visual/perceptual remediation/compensation PT Transfers Anticipated Outcome(s): supervision using LRAD PT Locomotion Anticipated Outcome(s): supervision using LRAD PT Recommendation Follow Up Recommendations: Home health PT;24 hour supervision/assistance Patient destination: Home Equipment Recommended: To be determined;Other (comment) Equipment Details: pt recently received wheelchair in January 2022   PT Evaluation Precautions/Restrictions Precautions Precautions: Fall;Other (comment) Precaution Comments: monitor BP for orthostatic hypotension, SpO2 due pulmonary hx, chronic wound on L toe Restrictions Weight Bearing Restrictions: Yes RLE Weight Bearing: Non weight bearing Pain Pain Assessment Pain Scale: 0-10 Pain Score: 1  (reported he had only 1 "twing" of pain) Pain Type: Acute pain;Surgical pain Pain Location: Leg Pain Orientation: Right;Lower Pain Descriptors / Indicators: Other (Comment) (twing) Pain Onset: With Activity (in dependent position) Pain Intervention(s): Rest;Repositioned Multiple Pain Sites: No Home Living/Prior Functioning Home Living Available Help at Discharge: Available 24 hours/day;Friend(s);Family (pt's wife works  full time but will have support from family friends) Type of Home: House Home Access: Ramped entrance [place]: Two level;Able to live on main level with bedroom/bathroom Bathroom Shower/Tub: Multimedia programmer: Handicapped height Bathroom Accessibility: Yes  Lives With: Spouse (wife, Shane [name]) Prior Function Level of Independence: Needs assistance with ADLs;Independent with transfers (using bar support) Bath: Minimal Toileting: Minimal Dressing: Minimal  Able to Take Stairs?: No Driving: No Vocation: Retired Comments: reports in the month prior to hospitalization he was NWBing on R LE and spent majority of time in bed or recliner and used an Transport planner in the home to get around only performing stand pivot transfers using bars placed next to the bed and recliner to support himself when transferring; reports not really using RW and not ambluating Perception  Perception Perception: Within Functional Limits Praxis Praxis: Intact  Cognition Overall Cognitive Status: Within Functional Limits for tasks assessed Arousal/Alertness: Awake/alert Orientation Level: Oriented X4 Attention: Focused;Sustained Focused Attention: Appears intact Sustained Attention: Appears intact Awareness: Appears intact Safety/Judgment: Appears intact Sensation Sensation Light Touch: Impaired Detail Peripheral sensation comments: decreased peripheral sensation below knee on L LE Light Touch Impaired Details: Impaired LLE Hot/Cold: Not tested Proprioception: Appears Intact Stereognosis: Not tested Coordination Gross Motor Movements are Fluid and Coordinated: No Fine Motor Movements are Fluid and Coordinated: Yes Coordination and Movement Description: impaired due to R LE BKA with NWBing restrictions and general debility/deconditioning Motor  Motor Motor: Other (comment) Motor - Skilled Clinical Observations: generalized deconditioning and weakness; R LE BKA with NWBing restrictions    Trunk/Postural Assessment  Cervical Assessment Cervical Assessment: Within Functional Limits Thoracic Assessment Thoracic Assessment: Exceptions to [facility] (rounded shoulders) Lumbar Assessment Lumbar Assessment: Exceptions to [facility] (posterior pelvid tilt in sitting) Postural Control Postural Control: Deficits on evaluation Postural Limitations: impaired due to R BKA  with NWBing restrictions requiring B UE support  Balance Balance Balance Assessed: Yes Static Sitting Balance Static Sitting - Balance Support: Feet supported Static Sitting - Level of Assistance: 6: Modified independent (Device/Increase time) Dynamic Sitting Balance Dynamic Sitting - Balance Support: Feet supported Dynamic Sitting - Level of Assistance: 5: Stand by assistance Static Standing Balance Static Standing - Balance Support: During functional activity;Bilateral upper extremity supported Static Standing - Level of Assistance: 4: Min assist Dynamic Standing Balance Dynamic Standing - Balance Support: During functional activity;Bilateral upper extremity supported Dynamic Standing - Level of Assistance: 4: Min assist;3: Mod assist Extremity Assessment      RLE Assessment RLE Assessment: Exceptions to [facility] Active Range of Motion (AROM) Comments: lacks ~10-15degrees of terminal knee extension, did not formally assess hip extension AROM General Strength Comments: general post-op weakness and limitations due to pain (no resistance applied) LLE Assessment LLE Assessment: Exceptions to [facility] Active Range of Motion (AROM) Comments: WFL General Strength Comments: demonstrates grossly 4/5 functional strength in L LE  Care Tool Care Tool Bed Mobility Roll left and right activity   Roll left and right assist level: Supervision/Verbal cueing    Sit to lying activity   Sit to lying assist level: Supervision/Verbal cueing    Lying to sitting edge of bed activity   Lying to sitting edge of bed assist level: Independent  with assistive device     Care Tool Transfers Sit to stand transfer   Sit to stand assist level: Minimal Assistance - Patient > 75%    Chair/bed transfer   Chair/bed transfer assist level: Minimal Assistance - Patient > 75%     Physiological scientist transfer assist level: Minimal Assistance - Patient > 75%      Care Tool Locomotion Ambulation   Assist level: 2 helpers (min A of 1 and +2 w/c follow) Assistive device: Walker-rolling Max distance: 74f  Walk 10 feet activity Walk 10 feet activity did not occur: Safety/medical concerns       Walk 50 feet with 2 turns activity Walk 50 feet with 2 turns activity did not occur: Safety/medical concerns      Walk 150 feet activity Walk 150 feet activity did not occur: Safety/medical concerns      Walk 10 feet on uneven surfaces activity Walk 10 feet on uneven surfaces activity did not occur: Safety/medical concerns      Stairs Stair activity did not occur: Safety/medical concerns        Walk up/down 1 step activity Walk up/down 1 step or curb (drop down) activity did not occur: Safety/medical concerns     Walk up/down 4 steps activity did not occuR: Safety/medical concerns  Walk up/down 4 steps activity      Walk up/down 12 steps activity Walk up/down 12 steps activity did not occur: Safety/medical concerns      Pick up small objects from floor Pick up small object from the floor (from standing position) activity did not occur: Safety/medical concerns      Wheelchair Will patient use wheelchair at discharge?: Yes Type of Wheelchair: Manual   Wheelchair assist level: Set up assist;Supervision/Verbal cueing Max wheelchair distance: 1069f Wheel 50 feet with 2 turns activity   Assist Level: Supervision/Verbal cueing  Wheel 150 feet activity Wheelchair 150 feet activity did not occur: Safety/medical concerns (SOB)      Refer to Care Plan for [place] PT  Short Term  Goal 1 (Week 1): Pt will perform supine<>sit mod-I PT Short Term Goal 2 (Week 1): Pt will perform sit<>stands using LRAD with CGA PT Short Term Goal 3 (Week 1): Pt will perform bed<>chair transfers using LRAD with CGA PT Short Term Goal 4 (Week 1): Pt will propel wheelchair at least 132ft with supervision PT Short Term Goal 5 (Week 1): Pt will ambulate at least 79ft using LRAD with CGA  Recommendations for other services: None   Skilled Therapeutic Intervention Evaluation completed (see details above) with patient education regarding purpose of PT evaluation, PT POC and goals, therapy schedule, weekly team meetings, and other CIR information including safety plan and fall risk safety. Patient received supine in bed and agreeable to therapy session. Completed the above and below mobility tasks with the level of assist described. Educated patient on importance of maintaining full R knee and hip extension ROM in preparation for future prosthetic - currently demonstrates lacking ~10-15degrees of knee extension, did not formally assess hip extension. Educated pt on purpose of limb guard and how to don it. Educated pt on performing lateral scoot/squat pivot transfers at this time to increase safety as he was used to having a poll support at home for stand pivot transfer until further able to advance transfer training with LRAD. During gait training pt demos limited ability to clear L LE from floor causing very short, shuffled hopping pattern - educated pt on importance of performing controlled hops to decrease shearing forces on L foot due to current wound on lateral side of L great toe - will benefit from increased practice with this in addition to B UE strengthening to improve ability to complete hop-to gait pattern. During wheelchair mobility training therapist educated pt on proper propulsion technique to decrease shoulder strain and improve push-stroke efficiency for energy conservation. At end of session  patient agreeable to remain sitting upright in w/c with R LE supported on amputee pad for low-load long duration knee extension stretch while wearing limb guard. Pt left seated in w/c with needs in reach and seat belt alarm on.   Mobility Bed Mobility Bed Mobility: Supine to Sit Supine to Sit: Supervision/Verbal cueing (using bedrail to simulate home set-up) Transfers Transfers: Sit to Stand;Stand to Sit;Lateral/Scoot Transfers Sit to Stand: Minimal Assistance - Patient > 75% Stand to Sit: Minimal Assistance - Patient > 75% Stand Pivot Transfers: Minimal Assistance - Patient > 75% Lateral/Scoot Transfers: Minimal Assistance - Patient > 75% Transfer (Assistive device): Rolling walker Locomotion  Gait Ambulation: Yes Gait Assistance: 2 Helpers (min assist of 1 and +2 w/c follow for safety) Gait Distance (Feet): 6 Feet Assistive device: Rolling walker Gait Assistance Details: Tactile cues for sequencing;Verbal cues for sequencing;Verbal cues for technique;Visual cues for safe use of DME/AE;Verbal cues for precautions/safety;Verbal cues for safe use of DME/AE;Verbal cues for gait pattern Gait Gait: Yes Gait Pattern: Impaired Gait Pattern: Poor foot clearance - left (limited L LE foot clearance with decreased ability to power up through UEs) Gait velocity: significantly decreased speed of movement Stairs / Additional Locomotion Stairs: No Wheelchair Mobility Wheelchair Mobility: Yes Wheelchair Assistance: Chartered loss adjuster: Both upper extremities Wheelchair Parts Management: Needs assistance Distance: 156ft   Discharge Criteria: Patient will be discharged from PT if patient refuses treatment 3 consecutive times without medical reason, if treatment goals not met, if there is a change in medical status, if patient makes no progress towards goals or if patient is discharged from hospital.  The above assessment,  treatment plan, treatment alternatives and  goals were discussed and mutually agreed upon: by patient  Tawana Scale , PT, DPT, CSRS  03/27/2021, 7:57 AM

## 2021-03-27 NOTE — Progress Notes (Signed)
PROGRESS NOTE   Subjective/Complaints: Had a good night. Having stump pain, but generally has been tolerable. Wearing shrinker. Wife at bedside  ROS: Patient denies fever, rash, sore throat, blurred vision, nausea, vomiting, diarrhea, cough, shortness of breath or chest pain,   headache, or mood change.    Objective:   No results found. Recent Labs    03/26/21 0409  WBC 7.9  HGB 8.0*  HCT 25.4*  PLT 292   Recent Labs    03/26/21 0409  NA 131*  K 5.0  CL 98  CO2 28  GLUCOSE 117*  BUN 22  CREATININE 1.12  CALCIUM 8.1*    Intake/Output Summary (Last 24 hours) at 03/27/2021 0915 Last data filed at 03/27/2021 0756 Gross per 24 hour  Intake 300 ml  Output 1800 ml  Net -1500 ml     Pressure Injury 03/26/21 Toe (Comment  which one) Anterior;Left Unstageable - Full thickness tissue loss in which the base of the injury is covered by slough (yellow, tan, gray, green or brown) and/or eschar (tan, brown or black) in the wound bed. pressu (Active)  03/26/21 1423  Location: Toe (Comment  which one)  Location Orientation: Anterior;Left  Staging: Unstageable - Full thickness tissue loss in which the base of the injury is covered by slough (yellow, tan, gray, green or brown) and/or eschar (tan, brown or black) in the wound bed.  Wound Description (Comments): pressure injury on medial aspect of great toe knuckle  Present on Admission: Yes    Physical Exam: Vital Signs Blood pressure 114/60, pulse (!) 58, temperature 98.4 F (36.9 C), resp. rate 12, height 6' (1.829 m), weight 103.7 kg, SpO2 95 %.  General: Alert and oriented x 3, No apparent distress HEENT: Head is normocephalic, atraumatic, PERRLA, EOMI, sclera anicteric, oral mucosa pink and moist, dentition intact, ext ear canals clear,  Neck: Supple without JVD or lymphadenopathy Heart: Reg rate and rhythm. No murmurs rubs or gallops Chest: CTA bilaterally without  wheezes, rales, or rhonchi; no distress Abdomen: Soft, non-tender, non-distended, bowel sounds positive. Extremities: No clubbing, cyanosis, or edema. Pulses are 2+ Psych: Pt's affect is appropriate. Pt is cooperative Skin: Stump incision CDI with staples. 2 foam dressings over openings proximally on leg Neuro: Pt is cognitively appropriate with normal insight, memory, and awareness. Cranial nerves 2-12 are intact. Sensory exam is normal. Reflexes are 2+ in all 4's. Fine motor coordination is intact. No tremors. Motor function is grossly 5/5.  Musculoskeletal:  Leg well shaped, still edematous, tender to palpation but could tolerate shrinker don/doff     Assessment/Plan: 1. Functional deficits which require 3+ hours per day of interdisciplinary therapy in a comprehensive inpatient rehab setting.  Physiatrist is providing close team supervision and 24 hour management of active medical problems listed below.  Physiatrist and rehab team continue to assess barriers to discharge/monitor patient progress toward functional and medical goals  Care Tool:  Bathing    Body parts bathed by patient: Right arm,Left arm,Chest,Abdomen,Front perineal area,Right upper leg,Left upper leg,Face   Body parts bathed by helper: Left lower leg,Buttocks Body parts n/a: Right lower leg   Bathing assist Assist Level: Minimal Assistance - Patient >  75%     Upper Body Dressing/Undressing Upper body dressing   What is the patient wearing?: Pull over shirt    Upper body assist Assist Level: Set up assist    Lower Body Dressing/Undressing Lower body dressing      What is the patient wearing?: Underwear/pull up,Pants     Lower body assist Assist for lower body dressing: Maximal Assistance - Patient 25 - 49%     Toileting Toileting Toileting Activity did not occur (Clothing management and hygiene only): N/A (no void or bm)  Toileting assist Assist for toileting: Moderate Assistance - Patient 50 - 74%      Transfers Chair/bed transfer  Transfers assist     Chair/bed transfer assist level: Minimal Assistance - Patient > 75%     Locomotion Ambulation   Ambulation assist              Walk 10 feet activity   Assist           Walk 50 feet activity   Assist           Walk 150 feet activity   Assist           Walk 10 feet on uneven surface  activity   Assist           Wheelchair     Assist               Wheelchair 50 feet with 2 turns activity    Assist            Wheelchair 150 feet activity     Assist          Blood pressure 114/60, pulse (!) 58, temperature 98.4 F (36.9 C), resp. rate 12, height 6' (1.829 m), weight 103.7 kg, SpO2 95 %.  Medical Problem List and Plan: 1.  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  Wound VAC removed 03/24/2021.             -patient may shower with stump covered             -ELOS/Goals: 12 days/supervision/mod             Patient is beginning CIR therapies today including PT and OT  2.  Antithrombotics: -DVT/anticoagulation: Eliquis             -antiplatelet therapy: N/A 3. Pain Management: Neurontin 300 mg 3 times daily, oxycodone as needed             Monitor with increased exertion, particularly for phantom limb pain 4. Mood: Lexapro 10 mg daily, Ativan 1 mg nightly             -antipsychotic agents: N/A 5. Neuropsych: This patient is capable of making decisions on his own behalf. 6. Skin/Wound Care: continue shrinker to stump--area looks great 7. Fluids/Electrolytes/Nutrition: Routine in and outs 8.  Acute blood loss anemia.  Continue iron supplement.               CBC ordered for monday 9.  Hypertension.  Lopressor 12.5 mg twice daily.               Controlled 4/23 10.  Diabetes mellitus with hyperglycemia.  Latest hemoglobin A1c 7.8.  NovoLog 4 units 3 times daily with meals, Lantus insulin 12 units nightly.  Check blood sugars  before meals and at bedtime            4/23 cbg's reasonable 11.  GERD.  Protonix 12.  Dyslipidemia: Lipitor 13.  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  Pacemaker removed 12/14/2020 due to staph bacteremia.  Follow-up cardiology services 14.  Idiopathic pulmonary fibrosis.  Continue CPAP 15.  Atrial fibrillation.  Cardiac rate controlled, regular.  Continue Eliquis.             Monitor increase activity 16.  CAD with CABG.  No chest pain or increasing shortness of breath 17.  CKD stage III.               CMP ordered for monday    LOS: 1 days A FACE TO FACE EVALUATION WAS PERFORMED  Meredith Staggers 03/27/2021, 9:15 AM

## 2021-03-28 LAB — GLUCOSE, CAPILLARY
Glucose-Capillary: 84 mg/dL (ref 70–99)
Glucose-Capillary: 132 mg/dL — ABNORMAL HIGH (ref 70–99)
Glucose-Capillary: 131 mg/dL — ABNORMAL HIGH (ref 70–99)
Glucose-Capillary: 147 mg/dL — ABNORMAL HIGH (ref 70–99)

## 2021-03-28 NOTE — Progress Notes (Signed)
PROGRESS NOTE   Subjective/Complaints: Stump pain controlled. Bumped left first toe in therapy yesterday. Worried about opening the wound that has been chronically there  ROS: Patient denies fever, rash, sore throat, blurred vision, nausea, vomiting, diarrhea, cough, shortness of breath or chest pain,   headache, or mood change.    Objective:   No results found. Recent Labs    03/26/21 0409  WBC 7.9  HGB 8.0*  HCT 25.4*  PLT 292   Recent Labs    03/26/21 0409  NA 131*  K 5.0  CL 98  CO2 28  GLUCOSE 117*  BUN 22  CREATININE 1.12  CALCIUM 8.1*    Intake/Output Summary (Last 24 hours) at 03/28/2021 0759 Last data filed at 03/28/2021 2409 Gross per 24 hour  Intake 400 ml  Output 1650 ml  Net -1250 ml     Pressure Injury 03/26/21 Toe (Comment  which one) Anterior;Left Unstageable - Full thickness tissue loss in which the base of the injury is covered by slough (yellow, tan, gray, green or brown) and/or eschar (tan, brown or black) in the wound bed. pressu (Active)  03/26/21 1423  Location: Toe (Comment  which one)  Location Orientation: Anterior;Left  Staging: Unstageable - Full thickness tissue loss in which the base of the injury is covered by slough (yellow, tan, gray, green or brown) and/or eschar (tan, brown or black) in the wound bed.  Wound Description (Comments): pressure injury on medial aspect of great toe knuckle  Present on Admission: Yes    Physical Exam: Vital Signs Blood pressure 126/68, pulse (!) 57, temperature 98.5 F (36.9 C), resp. rate 16, height 6' (1.829 m), weight 103.7 kg, SpO2 98 %.  Constitutional: No distress . Vital signs reviewed. HEENT: EOMI, oral membranes moist Neck: supple Cardiovascular: RRR without murmur. No JVD    Respiratory/Chest: CTA Bilaterally without wheezes or rales. Normal effort    GI/Abdomen: BS +, non-tender, non-distended Ext: no clubbing, cyanosis, or  edema Psych: pleasant and cooperative Skin: Stump incision CDI with staples. 2 foam dressings over openings proximally on leg. 2cm abrasion/chronic wound, dry on inner left first toe Neuro: Pt is cognitively appropriate with normal insight, memory, and awareness. Cranial nerves 2-12 are intact. Sensory exam is normal. Reflexes are 2+ in all 4's. Fine motor coordination is intact. No tremors. Motor function is grossly 5/5.  Musculoskeletal:  Leg well shaped, still edematous, tender to palpation but could tolerate shrinker don/doff     Assessment/Plan: 1. Functional deficits which require 3+ hours per day of interdisciplinary therapy in a comprehensive inpatient rehab setting.  Physiatrist is providing close team supervision and 24 hour management of active medical problems listed below.  Physiatrist and rehab team continue to assess barriers to discharge/monitor patient progress toward functional and medical goals  Care Tool:  Bathing    Body parts bathed by patient: Right arm,Left arm,Chest,Abdomen,Front perineal area,Right upper leg,Left upper leg,Face   Body parts bathed by helper: Left lower leg,Buttocks Body parts n/a: Right lower leg   Bathing assist Assist Level: Minimal Assistance - Patient > 75%     Upper Body Dressing/Undressing Upper body dressing   What is the patient wearing?:  Pull over shirt    Upper body assist Assist Level: Set up assist    Lower Body Dressing/Undressing Lower body dressing      What is the patient wearing?: Underwear/pull up,Pants     Lower body assist Assist for lower body dressing: Maximal Assistance - Patient 25 - 49%     Toileting Toileting Toileting Activity did not occur (Clothing management and hygiene only): N/A (no void or bm)  Toileting assist Assist for toileting: Moderate Assistance - Patient 50 - 74%     Transfers Chair/bed transfer  Transfers assist     Chair/bed transfer assist level: Minimal Assistance - Patient >  75%     Locomotion Ambulation   Ambulation assist      Assist level: 2 helpers (min A of 1 and +2 w/c follow) Assistive device: Walker-rolling Max distance: 58ft   Walk 10 feet activity   Assist  Walk 10 feet activity did not occur: Safety/medical concerns        Walk 50 feet activity   Assist Walk 50 feet with 2 turns activity did not occur: Safety/medical concerns         Walk 150 feet activity   Assist Walk 150 feet activity did not occur: Safety/medical concerns         Walk 10 feet on uneven surface  activity   Assist Walk 10 feet on uneven surfaces activity did not occur: Safety/medical concerns         Wheelchair     Assist Will patient use wheelchair at discharge?: Yes Type of Wheelchair: Manual    Wheelchair assist level: Set up assist,Supervision/Verbal cueing Max wheelchair distance: 140ft    Wheelchair 50 feet with 2 turns activity    Assist        Assist Level: Supervision/Verbal cueing   Wheelchair 150 feet activity     Assist  Wheelchair 150 feet activity did not occur: Safety/medical concerns (SOB)       Blood pressure 126/68, pulse (!) 57, temperature 98.5 F (36.9 C), resp. rate 16, height 6' (1.829 m), weight 103.7 kg, SpO2 98 %.  Medical Problem List and Plan: 1.  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  Wound VAC removed 03/24/2021.             -patient may shower with stump covered             -ELOS/Goals: 12 days/supervision/mod             Patient is beginning CIR therapies today including PT and OT  2.  Antithrombotics: -DVT/anticoagulation: Eliquis             -antiplatelet therapy: N/A 3. Pain Management: Neurontin 300 mg 3 times daily, oxycodone as needed             Monitor with increased exertion, particularly for phantom limb pain 4. Mood: Lexapro 10 mg daily, Ativan 1 mg nightly             -antipsychotic agents: N/A 5. Neuropsych: This patient is  capable of making decisions on his own behalf. 6. Skin/Wound Care: continue shrinker to stump--area looks great  -foam dressing to left great toe 7. Fluids/Electrolytes/Nutrition: Routine in and outs 8.  Acute blood loss anemia.  Continue iron supplement.               CBC ordered for monday 9.  Hypertension.  Lopressor 12.5 mg twice daily.  Controlled 4/24 10.  Diabetes mellitus with hyperglycemia.  Latest hemoglobin A1c 7.8.  NovoLog 4 units 3 times daily with meals, Lantus insulin 12 units nightly.  Check blood sugars before meals and at bedtime            4/24 cbg's reasonable 11.  GERD.  Protonix 12.  Dyslipidemia: Lipitor 13.  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  Pacemaker removed 12/14/2020 due to staph bacteremia.  Follow-up cardiology services 14.  Idiopathic pulmonary fibrosis.  Continue CPAP 15.  Atrial fibrillation.  Cardiac rate controlled, regular.  Continue Eliquis.             Monitor increase activity 16.  CAD with CABG.  No chest pain or increasing shortness of breath 17.  CKD stage III.               CMP ordered for monday    LOS: 2 days A FACE TO FACE EVALUATION WAS PERFORMED  Meredith Staggers 03/28/2021, 7:59 AM

## 2021-03-29 DIAGNOSIS — D62 Acute posthemorrhagic anemia: Secondary | ICD-10-CM

## 2021-03-29 DIAGNOSIS — Z794 Long term (current) use of insulin: Secondary | ICD-10-CM

## 2021-03-29 DIAGNOSIS — L97525 Non-pressure chronic ulcer of other part of left foot with muscle involvement without evidence of necrosis: Secondary | ICD-10-CM

## 2021-03-29 DIAGNOSIS — E871 Hypo-osmolality and hyponatremia: Secondary | ICD-10-CM

## 2021-03-29 DIAGNOSIS — E1165 Type 2 diabetes mellitus with hyperglycemia: Secondary | ICD-10-CM

## 2021-03-29 DIAGNOSIS — E11621 Type 2 diabetes mellitus with foot ulcer: Secondary | ICD-10-CM

## 2021-03-29 LAB — COMPREHENSIVE METABOLIC PANEL
ALT: 9 U/L (ref 0–44)
AST: 14 U/L — ABNORMAL LOW (ref 15–41)
Albumin: 2.2 g/dL — ABNORMAL LOW (ref 3.5–5.0)
Alkaline Phosphatase: 82 U/L (ref 38–126)
Anion gap: 6 (ref 5–15)
BUN: 18 mg/dL (ref 8–23)
CO2: 26 mmol/L (ref 22–32)
Calcium: 8.5 mg/dL — ABNORMAL LOW (ref 8.9–10.3)
Chloride: 99 mmol/L (ref 98–111)
Creatinine, Ser: 1.17 mg/dL (ref 0.61–1.24)
GFR, Estimated: >60 mL/min (ref >60)
Glucose, Bld: 97 mg/dL (ref 70–99)
Potassium: 4.5 mmol/L (ref 3.5–5.1)
Sodium: 131 mmol/L — ABNORMAL LOW (ref 135–145)
Total Bilirubin: 0.6 mg/dL (ref 0.3–1.2)
Total Protein: 6.3 g/dL — ABNORMAL LOW (ref 6.5–8.1)

## 2021-03-29 LAB — CBC WITH DIFFERENTIAL/PLATELET
Abs Immature Granulocytes: 0.02 10*3/uL (ref 0.00–0.07)
Basophils Absolute: 0.1 10*3/uL (ref 0.0–0.1)
Basophils Relative: 1 %
Eosinophils Absolute: 0.5 10*3/uL (ref 0.0–0.5)
Eosinophils Relative: 7 %
HCT: 27.6 % — ABNORMAL LOW (ref 39.0–52.0)
Hemoglobin: 8.8 g/dL — ABNORMAL LOW (ref 13.0–17.0)
Immature Granulocytes: 0 %
Lymphocytes Relative: 24 %
Lymphs Abs: 1.7 10*3/uL (ref 0.7–4.0)
MCH: 28.2 pg (ref 26.0–34.0)
MCHC: 31.9 g/dL (ref 30.0–36.0)
MCV: 88.5 fL (ref 80.0–100.0)
Monocytes Absolute: 0.8 10*3/uL (ref 0.1–1.0)
Monocytes Relative: 11 %
Neutro Abs: 4.3 10*3/uL (ref 1.7–7.7)
Neutrophils Relative %: 57 %
Platelets: 320 10*3/uL (ref 150–400)
RBC: 3.12 MIL/uL — ABNORMAL LOW (ref 4.22–5.81)
RDW: 14.8 % (ref 11.5–15.5)
WBC: 7.4 10*3/uL (ref 4.0–10.5)
nRBC: 0 % (ref 0.0–0.2)

## 2021-03-29 LAB — GLUCOSE, CAPILLARY
Glucose-Capillary: 81 mg/dL (ref 70–99)
Glucose-Capillary: 150 mg/dL — ABNORMAL HIGH (ref 70–99)
Glucose-Capillary: 107 mg/dL — ABNORMAL HIGH (ref 70–99)
Glucose-Capillary: 161 mg/dL — ABNORMAL HIGH (ref 70–99)

## 2021-03-29 MED ORDER — DIPHENHYDRAMINE HCL 25 MG PO CAPS
25.0000 mg | ORAL_CAPSULE | Freq: Four times a day (QID) | ORAL | Status: DC | PRN
  Administered 2021-03-29 – 2021-04-12 (×16): 25 mg via ORAL
  Filled 2021-03-29 (×17): qty 1

## 2021-03-29 NOTE — Progress Notes (Signed)
Patient information reviewed and entered into e[facility]hab System by Becky Rue Tinnel, PPS coordinator. Information including medical coding, function ability, and quality indicators will be reviewed and updated through discharge.   

## 2021-03-29 NOTE — Progress Notes (Signed)
Inpatient [facility] Individual Statement of Services  Patient Name:  Shane [name]  Date:  03/29/2021  Welcome to the [facility].  Our goal is to provide you with an individualized program based on your diagnosis and situation, designed to meet your specific needs.  With this comprehensive rehabilitation program, you will be expected to participate in at least 3 hours of rehabilitation therapies Monday-Friday, with modified therapy programming on the weekends.  Your rehabilitation program will include the following services:  Physical Therapy (PT), Occupational Therapy (OT), 24 hour per day rehabilitation nursing, Neuropsychology, Care Coordinator, Rehabilitation Medicine, Nutrition Services and Pharmacy Services  Weekly team conferences will be held on Wednesday to discuss your progress.  Your Inpatient Rehabilitation Care Coordinator will talk with you frequently to get your input and to update you on team discussions.  Team conferences with you and your family in attendance may also be held.  Expected length of stay: 10-12 days  Overall anticipated outcome: supervision-CGA level  Depending on your progress and recovery, your program may change. Your Inpatient Rehabilitation Care Coordinator will coordinate services and will keep you informed of any changes. Your Inpatient Rehabilitation Care Coordinator's name and contact numbers are listed  below.  The following services may also be recommended but are not provided by the [facility] will be made to provide these services after discharge if needed.  Arrangements include referral to agencies that provide these services.  Your insurance has been verified to be:  [employer]-Medicare Your primary doctor is:  Burnard Bunting  Pertinent information will be shared with your doctor and  your insurance company.  Inpatient Rehabilitation Care Coordinator:  Ovidio Kin, [place] or Emilia Beck  Information discussed with and copy given to patient by: Elease Hashimoto, 03/29/2021, 11:19 AM

## 2021-03-29 NOTE — Progress Notes (Signed)
Inpatient Rehabilitation Care Coordinator Assessment and Plan Patient Details  Name: Shane [name] MRN: [id number] Date of Birth: 14-Dec-1940  Today's Date: 03/29/2021  Hospital Problems: Principal Problem:   Right below-knee amputee [facility]) Active Problems:   Hyponatremia  Past Medical History:  Past Medical History:  Diagnosis Date  . Anxiety   . 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 Cataract   . Chronic kidney disease    d/t DM  . CKD (chronic kidney disease), stage III ([place])   . Depression   . Diabetes mellitus    Vgo disposal insulin bolus  simular to insulin pump  . Dyspnea   . GERD (gastroesophageal reflux disease)   . History of kidney stones    passed  . Hyperlipidemia   . Hypertension   . Idiopathic pulmonary fibrosis ([place]) 11/2016  . ILD (interstitial lung disease) ([place])   . Moderate aortic stenosis    a. 10/2019 Echo: EF 55-60%, Gr2 DD. Nl RV.   Marland Kitchen Neuromuscular disorder ([place])   . Neuropathy associated with endocrine disorder ([place])   . Nonobstructive CAD (coronary artery disease)    a. 2012 Cath: mod, nonobs dzs; b. 10/2016 MV: EF 60%, no ischemia.  . OSA on CPAP 05/05/2017   Unattended Home Sleep Test 7/2/813-AHI 38.6/hour, desaturation to 64%, body weight 261 pounds  . PONV (postoperative nausea and vomiting)   . Sleep apnea     uses cpap asked to bring mask and tubing   Past Surgical History:  Past Surgical History:  Procedure Laterality Date  . ABDOMINAL AORTOGRAM W/LOWER EXTREMITY N/A 12/10/2020   Procedure: ABDOMINAL AORTOGRAM W/LOWER EXTREMITY;  Surgeon: Cherre Robins, MD;  Location: [facility];  Service: Cardiovascular;  Laterality: N/A;  . AMPUTATION Right 01/22/2021   Procedure: 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;  Surgeon: [name], MD;  Location: [facility];  Service: Orthopedics;  Laterality: Right;  . AMPUTATION Right 03/17/2021   Procedure: RIGHT BELOW KNEE AMPUTATION;  Surgeon: [name], MD;  Location: [facility];  Service: Orthopedics;  Laterality: Right;   . ANKLE FUSION Right 01/22/2021   Procedure: RIGHT FOOT TIBIOCALCANEAL FUSION;  Surgeon: [name], MD;  Location: [facility];  Service: Orthopedics;  Laterality: Right;  . ANTERIOR FUSION CERVICAL SPINE  2012  . CARDIAC CATHETERIZATION  2011  . CARDIAC CATHETERIZATION N/A 11/09/2016   Procedure: Right Heart Cath;  Surgeon: Belva Crome, MD;  Location: [facility];  Service: Cardiovascular;  Laterality: N/A;  . carpel tunnel     left wrist  . CATARACT EXTRACTION    . CATARACT EXTRACTION W/ INTRAOCULAR LENS  IMPLANT, BILATERAL  2013  . CERVICAL LAMINECTOMY  2012  . COLONOSCOPY N/A 01/14/2013   Procedure: COLONOSCOPY;  Surgeon: Irene Shipper, MD;  Location: [facility];  Service: Endoscopy;  Laterality: N/A;  . CORONARY ARTERY BYPASS GRAFT  11/04/2020   LIMA-LAD, SVG-OM1, SVG-PDA (Dr Marney Setting [facility]) dc 11/18/2020  . EYE SURGERY    . I & D EXTREMITY Right 02/19/2021   Procedure: RIGHT ANKLE DEBRIDEMENT AND PLACEMENT ANTIBIOTIC BEADS;  Surgeon: [name], MD;  Location: [facility];  Service: Orthopedics;  Laterality: Right;  . [place]   left  . LEFT HEART CATH AND CORONARY ANGIOGRAPHY N/A 07/10/2020   Procedure: LEFT HEART CATH AND CORONARY ANGIOGRAPHY;  Surgeon: Sherren Mocha, MD;  Location: [facility];  Service: Cardiovascular;  Laterality: N/A;  . LUMBAR LAMINECTOMY  2003  . LUNG BIOPSY  Left 12/26/2016   Procedure: LUNG BIOPSY;  Surgeon: [name], MD;  Location: [facility];  Service: Thoracic;  Laterality: Left;  . PACEMAKER IMPLANT N/A 03/30/2020   Procedure: PACEMAKER IMPLANT;  Surgeon: Evans Lance, MD;  Location: [facility];  Service: Cardiovascular;  Laterality: N/A;  . PERIPHERAL VASCULAR INTERVENTION Right 12/10/2020   Procedure: PERIPHERAL VASCULAR INTERVENTION;  Surgeon: Cherre Robins, MD;  Location: [facility];  Service: Cardiovascular;  Laterality: Right;  SFA  . POSTERIOR FUSION CERVICAL SPINE  2012  . PPM GENERATOR REMOVAL  N/A 12/14/2020   Procedure: PPM GENERATOR REMOVAL;  Surgeon: Evans Lance, MD;  Location: [facility];  Service: Cardiovascular;  Laterality: N/A;  . TEE WITHOUT CARDIOVERSION N/A 12/11/2020   Procedure: TRANSESOPHAGEAL ECHOCARDIOGRAM (TEE);  Surgeon: Geralynn Rile, MD;  Location: [facility];  Service: Cardiovascular;  Laterality: N/A;  . TRIGGER FINGER RELEASE  2011   4th finger left hand  . VIDEO ASSISTED THORACOSCOPY Left 12/26/2016   Procedure: VIDEO ASSISTED THORACOSCOPY;  Surgeon: [name], MD;  Location: [facility];  Service: Thoracic;  Laterality: Left;  Marland Kitchen VIDEO BRONCHOSCOPY N/A 12/26/2016   Procedure: VIDEO BRONCHOSCOPY;  Surgeon: [name], MD;  Location: [facility];  Service: Thoracic;  Laterality: N/A;   Social History:  reports that he has never smoked. He has never used smokeless tobacco. He reports that he does not drink alcohol and does not use drugs.  Family / Support Systems Marital Status: Married Patient Roles: Spouse,Parent Spouse/Significant Other: [name] [phone] Children: Son-Larry close by Other Supports: Two grandson's close by Anticipated Caregiver: Wife and children along with hired assist if needed Ability/Limitations of Caregiver: Wife works full time Careers adviser: 24/7 Family Dynamics: Close knit family all will make sure he will have care he needs at discharge. Recently was at [facility] 11/2020 after CABG. Pt wants to get as good as he can before going home  Social History Preferred language: English Religion: Presbyterian Cultural Background: No issues Education: Data processing manager: Yes Write: Yes Employment Status: Retired Public relations account executive Issues: No issues Guardian/Conservator: None-according to MD pt is capable of making his own decisions while here   Abuse/Neglect Abuse/Neglect Assessment Can Be Completed: Yes Physical Abuse: Denies Verbal Abuse: Denies Sexual Abuse: Denies Exploitation of  patient/patient's resources: Denies Self-Neglect: Denies  Emotional Status Pt's affect, behavior and adjustment status: Pt is motivated to do well and recover from his amputation. he recenlty recovered from his CABG he had in 11/2020. He reports his blood got infected and infected his foot. He is positive and ready to work and get well to go home Recent Psychosocial Issues: other health issues-recent surgery 11/2020 at [place] History: No history-issues-deferred depression screen seems to be coping appropriatley and able to verbalize his concerns and issues Substance Abuse History: No issues  Patient / Family Perceptions, Expectations & Goals Pt/Family understanding of illness & functional limitations: Pt and wife can explain his surgery and loss of his leg. Both talk with MD and feel have a good understanding of treatment plan going forward. Premorbid pt/family roles/activities: Husband, father, retiree, church member Anticipated changes in roles/activities/participation: resume Pt/family expectations/goals: Pt states: " I want to do as much as I can for myself before I go home."  Wife states; " He will have someone with him at home, I feel more comfortable with someone there while I am working."  [employer]: Other (Comment) (power chair, rw, elevated commode and  grab bars) Premorbid Home Care/DME Agencies: Other (Comment) (Active with [employer]) Transportation available at discharge: Wife and children  Discharge Planning Living Arrangements: Spouse/significant other Support Systems: Spouse/significant other,Children,Other relatives,Friends/neighbors Type of Residence: Private residence 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 Also has a hired person who can be there while wife works. Informed of supervision levle goals and length of stay of 10-12 days. Care Coordinator Anticipated Follow Up Needs: HH/OP  Clinical Impression Pleasant gentleman who is motivated to do well and recovered form his recent CABG in 11/2020. He has good supports via family and will need someone with him at discharge from here. Will work on discharge needs.  Elease Hashimoto 03/29/2021, 11:29 AM

## 2021-03-29 NOTE — Progress Notes (Signed)
PROGRESS NOTE   Subjective/Complaints: Patient seen working with therapy this morning, sitting at edge of his bed.  He states he slept fairly well overnight.  He is concerned about his right toe ulcer.  ROS: + Shortness of breath (baseline).  Denies CP, N/V/D  Objective:   No results found. Recent Labs    03/29/21 0427  WBC 7.4  HGB 8.8*  HCT 27.6*  PLT 320   Recent Labs    03/29/21 0427  NA 131*  K 4.5  CL 99  CO2 26  GLUCOSE 97  BUN 18  CREATININE 1.17  CALCIUM 8.5*    Intake/Output Summary (Last 24 hours) at 03/29/2021 0932 Last data filed at 03/29/2021 1219 Gross per 24 hour  Intake 700 ml  Output 1675 ml  Net -975 ml     Pressure Injury 03/26/21 Toe (Comment  which one) Anterior;Left Unstageable - Full thickness tissue loss in which the base of the injury is covered by slough (yellow, tan, gray, green or brown) and/or eschar (tan, brown or black) in the wound bed. pressu (Active)  03/26/21 1423  Location: Toe (Comment  which one)  Location Orientation: Anterior;Left  Staging: Unstageable - Full thickness tissue loss in which the base of the injury is covered by slough (yellow, tan, gray, green or brown) and/or eschar (tan, brown or black) in the wound bed.  Wound Description (Comments): pressure injury on medial aspect of great toe knuckle  Present on Admission: Yes    Physical Exam: Vital Signs Blood pressure 118/65, pulse 60, temperature 98.8 F (37.1 C), resp. rate 18, height 6' (1.829 m), weight 103.7 kg, SpO2 96 %. Constitutional: No distress . Vital signs reviewed. HENT: Normocephalic.  Atraumatic. Eyes: EOMI. No discharge. Cardiovascular: No JVD.  Irregularly irregular. Respiratory: Normal effort.  No stridor.  Bilateral clear to auscultation. GI: Non-distended.  BS +. Skin: Warm and dry.   Left lower extremity first digit with ulcer on medial toe Psych: Normal mood.  Normal  behavior. Musc: Left BKA with edema and tenderness Neuro: Alert Motor: 5/5 throughout   Assessment/Plan: 1. Functional deficits which require 3+ hours per day of interdisciplinary therapy in a comprehensive inpatient rehab setting.  Physiatrist is providing close team supervision and 24 hour management of active medical problems listed below.  Physiatrist and rehab team continue to assess barriers to discharge/monitor patient progress toward functional and medical goals  Care Tool:  Bathing    Body parts bathed by patient: Right arm,Left arm,Chest,Abdomen,Front perineal area,Right upper leg,Left upper leg,Face   Body parts bathed by helper: Left lower leg,Buttocks Body parts n/a: Right lower leg   Bathing assist Assist Level: Minimal Assistance - Patient > 75%     Upper Body Dressing/Undressing Upper body dressing   What is the patient wearing?: Pull over shirt    Upper body assist Assist Level: Set up assist    Lower Body Dressing/Undressing Lower body dressing      What is the patient wearing?: Underwear/pull up,Pants     Lower body assist Assist for lower body dressing: Maximal Assistance - Patient 25 - 49%     Toileting Toileting Toileting Activity did not occur Landscape architect  and hygiene only): N/A (no void or bm)  Toileting assist Assist for toileting: Minimal Assistance - Patient > 75%     Transfers Chair/bed transfer  Transfers assist     Chair/bed transfer assist level: Minimal Assistance - Patient > 75%     Locomotion Ambulation   Ambulation assist      Assist level: 2 helpers (min A of 1 and +2 w/c follow) Assistive device: Walker-rolling Max distance: 48ft   Walk 10 feet activity   Assist  Walk 10 feet activity did not occur: Safety/medical concerns        Walk 50 feet activity   Assist Walk 50 feet with 2 turns activity did not occur: Safety/medical concerns         Walk 150 feet activity   Assist Walk 150 feet  activity did not occur: Safety/medical concerns         Walk 10 feet on uneven surface  activity   Assist Walk 10 feet on uneven surfaces activity did not occur: Safety/medical concerns         Wheelchair     Assist Will patient use wheelchair at discharge?: Yes Type of Wheelchair: Manual    Wheelchair assist level: Set up assist,Supervision/Verbal cueing Max wheelchair distance: 171ft    Wheelchair 50 feet with 2 turns activity    Assist        Assist Level: Supervision/Verbal cueing   Wheelchair 150 feet activity     Assist  Wheelchair 150 feet activity did not occur: Safety/medical concerns (SOB)        Medical Problem List and Plan: 1.  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  Wound VAC removed 03/24/2021.  Continue CIR  2.  Antithrombotics: -DVT/anticoagulation: Eliquis             -antiplatelet therapy: N/A 3. Pain Management: Neurontin 300 mg 3 times daily, oxycodone as needed             Controlled with meds on 4/25  Monitor with increased exertion, particularly for phantom limb pain 4. Mood: Lexapro 10 mg daily, Ativan 1 mg nightly             -antipsychotic agents: N/A 5. Neuropsych: This patient is capable of making decisions on his own behalf. 6. Skin/Wound Care: continue shrinker to stump  -foam dressing to left great toe -surgery to evaluate 7. Fluids/Electrolytes/Nutrition: Routine in and outs 8.  Acute blood loss anemia.  Continue iron supplement.    Hemoglobin 8.8 on 4/25  Continue to monitor 9.  Hypertension.  Lopressor 12.5 mg twice daily.               Controlled on 4/25 10.  Diabetes mellitus with hyperglycemia.  Latest hemoglobin A1c 7.8.  NovoLog 4 units 3 times daily with meals, Lantus insulin 12 units nightly.  Check blood sugars before meals and at bedtime            Labile on 4/25 11.  GERD.  Protonix 12.  Dyslipidemia: Lipitor 13.  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   Pacemaker removed 12/14/2020 due to staph bacteremia.  Follow-up cardiology services 14.  Idiopathic pulmonary fibrosis.  Continue CPAP 15.  Atrial fibrillation.  Cardiac rate controlled, regular.  Continue Eliquis.             Monitor increase activity 16.  CAD with CABG.  No chest pain or increasing shortness of breath 17.  CKD stage II/III.  Creatinine 1.17 on 4/25  Continue to monitor 18.  Hyponatremia  Sodium 131 on 4/25  Continue to monitor   LOS: 3 days A FACE TO FACE EVALUATION WAS PERFORMED  Shane [name] 03/29/2021, 9:32 AM

## 2021-03-29 NOTE — Progress Notes (Signed)
Physical Therapy Session Note  Patient Details  Name: Shane [name] MRN: [id number] Date of Birth: [birth year]  Today's Date: 03/29/2021 PT Individual Time: 0830-0930 PT Individual Time Calculation (min): 60 min   Short Term Goals: Week 1:  PT Short Term Goal 1 (Week 1): Pt will perform supine<>sit mod-I PT Short Term Goal 2 (Week 1): Pt will perform sit<>stands using LRAD with CGA PT Short Term Goal 3 (Week 1): Pt will perform bed<>chair transfers using LRAD with CGA PT Short Term Goal 4 (Week 1): Pt will propel wheelchair at least 149ft with supervision PT Short Term Goal 5 (Week 1): Pt will ambulate at least 75ft using LRAD with CGA  Skilled Therapeutic Interventions/Progress Updates:    Patient in supine and reports had some itching overnight but took Benadryl.  Patient supine to sit with S in bed & donned limb protector with max A.  Orthostatics performed as noted below.  Performed squat pivot to w/c with min A for steadying and w/c stabilization.  Propelled w/c x 100' with S prior to UE fatigue.  In general gym.  Pivot transfer to mat with min A.  Sit to supine with S.  Performed therex as noted below.  Patient supine to sit min A from flat bed, no rails.  Patient dizzy initially sitting, but quickly improved.  Patient sit to stand to RW with min A.  Ambulated to w/c with RW and min A x 4', but unable to take steps to turn to sit and knee buckling so second person brought chair behind pt to sit for safety.  Assisted in w/c to room and pt performed squat pivot to bed to rest in supine due to reported he has a sore on sacrum.  Also showed MD sore on L great toe.  Patient sit to supine with S.  Left with all needs in reach and limb protector doffed, bed alarm active.   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 Restrictions Weight Bearing  Restrictions: Yes RLE Weight Bearing: Non weight bearing General:   Vital Signs: Orthostatic VS for the past 24 hrs (Last 3 readings):  BP- Lying Pulse- Lying BP- Sitting Pulse- Sitting BP- Standing at 0 minutes Pulse- Standing at 0 minutes  03/29/21 0800 127/61 59 109/57 67 118/74 82   Pain: Pain Assessment Pain Scale: 0-10 Pain Score: 0-No pain Faces Pain Scale: Hurts a little bit Pain Type: Acute pain Pain Location: Leg Pain Orientation: Right Pain Descriptors / Indicators: Discomfort Pain Intervention(s): Repositioned Exercises: General Exercises - Lower Extremity Short Arc Quad: Strengthening;Both;20 reps (5# on L) Amputee Exercises Quad Sets: Strengthening;Right;10 reps;Supine (5 sec hold) Towel Squeeze: Strengthening;Both;10 reps;Supine (5 sec hold) Straight Leg Raises: Strengthening;20 reps;Supine;Both  Sidelying hip abduction with A for position and technique 2 x 10 reps   Therapy/Group: Individual Therapy  Reginia Naas  Greene, PT 03/29/2021, 10:13 AM

## 2021-03-29 NOTE — Progress Notes (Signed)
Occupational Therapy Session Note  Patient Details  Name: Shane [name] MRN: [id number] Date of Birth: 02-Mar-1941  Today's Date: 03/29/2021 OT Individual Time: 1100-1200   &   1300-1400 OT Individual Time Calculation (min): 60 min    &   60 min   Short Term Goals: Week 1:  OT Short Term Goal 1 (Week 1): With use of AE, pt will complete LB dressing with min assist OT Short Term Goal 2 (Week 1): Pt will complete toileting hygiene and clothing management with min assist OT Short Term Goal 3 (Week 1): Pt demonstrate dynamic standing balance with CGA during functional ADL/IADL task.  Skilled Therapeutic Interventions/Progress Updates:    AM session:   Patient in bed, alert and ready for therapy session.  He states that pain is under control at this time.  Supine to sitting edge of bed with CS.   He is able to donn amp shield with min A.  Sit to stand from elevated bed surface with CGA.  Pivot to w/c with RW min A.  Note difficulty unweighting left foot to turn or hop due to upper body weakness.  He is able to propel w/c 50-70 feet at slow rate.  Completed activities with focus on upper body conditioning and body support.  Reviewed use of bed side commode and provided bucket for commode in room.  Reinforced need for staff assistance for all transfers at this time.  Completed sit to stand and light trunk mobility.  SPT back to bed at close of session with mod A due to fatigue.  Sit to supine with CS.  Bed alarm set and call bell/tray table in reach for lunch.     PM session:   Patient in bed, ready for second session.  Supine to sit CS.  SPT with RW to w/c min A.  Able to propel w/c half way to therapy gym at slow rate.  Sit pivot transfer w/c to/from mat table with min A.  Reviewed transfer types and energy conservation/options when fatigued.  Reviewed option for shower transfer and DME - he would like shower bench and bed side commode for home.   Completed trunk/core, and upper body mobility and  conditioning activities in both supported and unsupported sitting positions with good tolerance, rest breaks needed between tasks.  HR 60, O2 sat 92%.  SPT with RW back to bed at close of session with min A.  Sit to supine CS.  Bed alarm set and all needs met at this time.      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

## 2021-03-30 DIAGNOSIS — J84112 Idiopathic pulmonary fibrosis: Secondary | ICD-10-CM

## 2021-03-30 DIAGNOSIS — J8410 Pulmonary fibrosis, unspecified: Secondary | ICD-10-CM

## 2021-03-30 DIAGNOSIS — Z006 Encounter for examination for normal comparison and control in clinical research program: Secondary | ICD-10-CM

## 2021-03-30 DIAGNOSIS — R7309 Other abnormal glucose: Secondary | ICD-10-CM

## 2021-03-30 LAB — GLUCOSE, CAPILLARY
Glucose-Capillary: 82 mg/dL (ref 70–99)
Glucose-Capillary: 161 mg/dL — ABNORMAL HIGH (ref 70–99)
Glucose-Capillary: 177 mg/dL — ABNORMAL HIGH (ref 70–99)
Glucose-Capillary: 221 mg/dL — ABNORMAL HIGH (ref 70–99)

## 2021-03-30 MED ORDER — STUDY - FIBROGEN - PAMREVLUMAB 10 MG/ML IV INFUSION (OPEN LABEL) (PI-RAMASWAMY)
30.0000 mg/kg | Freq: Once | INTRAVENOUS | Status: AC
  Administered 2021-03-30: 3010 mg via INTRAVENOUS
  Filled 2021-03-30: qty 301

## 2021-03-30 NOTE — Research (Signed)
Title: [id number] Open-Label Extension (OLE) is a multi-center, single-arm, open-label extension (OLE) phase where subjects who complete the Week 48 visit of the main study are eligible to participate. The extension to the main study allows for the continued evaluation of the efficacy and safety of 30 mg/kg IV infusions of pamrevlumab administered every 3 weeks for 52 weeks in subjects with Idiopathic Pulmonary Fibrosis.  Primary endpoint is: To provide continued access to pamrevlumab in subjects with IPF who completed the Week 48 visit of the main study  Protocol #: [id number], Clinical Trials #: [id number] Sponsor: [email or web address]  ([place], [place], [place])  Scientist, physiological / Electrical engineer note : This visit for Subject Shane [name] with DOB: 1941/10/13 on 03/30/2021 for the above protocol is Visit/Encounter #EX Week 72 and is for purpose of research.   The consent for this encounter is under Protocol VersionAmendment 5.0 (Dated 26AUG2021), Investigator Brochure Edition version 19 dated05 NOV 2021Consent Version4.0, revised 21JUL2021andiscurrently IRB approved.    Subject expressed continued interest and consent in continuing as a study subject. Subject confirmed that there was no change in contact information (e.g. address, telephone, email). Subject thanked for participation in research and contribution to science.  In this visit 03/30/2021 the subject is not required to be evaluated by an investigator.   During this visit on 03/30/2021, all study procedures were completed per the above mentioned protocol.  Prior to this visit, PI, Dr. Brand Males communicated directly with the subject's inpatient physician to ensure the study drug was given safely. After approval from [place], the research assistant traveled to the hospital to ensure the visit was completed per the above mentioned protocol. Subject tolerated the infusion well. Please refer to the subject's paper source  binder for further details of this visit. Subject will meet with research in approximately three weeks for his next scheduled infusion.  Signed by Gwendolyn Grant Research Assistant [employer]  [place], [place] 2:54 PM 03/30/2021

## 2021-03-30 NOTE — Progress Notes (Signed)
Occupational Therapy Session Note  Patient Details  Name: Shane [name] MRN: [id number] Date of Birth: May 10, 1941  Today's Date: 03/30/2021 OT Individual Time: 0800-0900   &   1300-1345 OT Individual Time Calculation (min): 60 min    &    45 min   Short Term Goals: Week 1:  OT Short Term Goal 1 (Week 1): With use of AE, pt will complete LB dressing with min assist OT Short Term Goal 2 (Week 1): Pt will complete toileting hygiene and clothing management with min assist OT Short Term Goal 3 (Week 1): Pt demonstrate dynamic standing balance with CGA during functional ADL/IADL task.  Skilled Therapeutic Interventions/Progress Updates:    AM session:   Patient in bed, alert and ready for therapy session.  He denies pain and states that he would like a shower.  Incision, iv site and left toe covered.  Supine to sitting edge of bed with CS and bed rails.  Sit pivot transfer to/from bed, w/c, shower bench min A.   Patient able to doff clothing with min A.  Shower completed with min A for buttocks, left lower leg and set up.  Dressing completed seated on shower bench - OH shirt  Set up   , underwear/pants  Min A, clothing management in stance with grab bars min A, left slipper sock max A.   Oral care and hair care mod I seated at sink.   Max A to donn shrinker/sock and min A to donn amp shield.  He remained seated in w/c at close of session.  Seat belt alarm set and call bell / tray table in reach.      PM session:    Patient in bed, finishing IV/treatment.  He denies pain and is ready for afternoon session.  Supine to sitting edge of bed with CS.  Sit pivot transfer bed to w/c with CS/CGA.  Completed upperbody exercises with focus on proximal stability, trunk and extensors for increased ease of walker use.  Completed upper body ergometer 2 x 4 minutes.  Reviewed and practiced use of reacher and sock aide to doff/donn left sock with good carryover and ability to perform with CS after initial instruction.   He remained seated in w/c at close of session, seat belt alarm set and callbell/tray table in reach.       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

## 2021-03-30 NOTE — Progress Notes (Signed)
PROGRESS NOTE   Subjective/Complaints: Patient seen sitting up in his chair this morning working with therapies.  He states he slept fairly well overnight.  He notes a persistent cough.  He states he was treated with breathing treatments on acute floor with benefit.  Also discussed with pulm, plan for infusion today.  Discussed wounds with therapies.  ROS: + Shortness of breath (baseline, unchanged).  Denies CP, N/V/D  Objective:   No results found. Recent Labs    03/29/21 0427  WBC 7.4  HGB 8.8*  HCT 27.6*  PLT 320   Recent Labs    03/29/21 0427  NA 131*  K 4.5  CL 99  CO2 26  GLUCOSE 97  BUN 18  CREATININE 1.17  CALCIUM 8.5*    Intake/Output Summary (Last 24 hours) at 03/30/2021 0921 Last data filed at 03/30/2021 0840 Gross per 24 hour  Intake 840 ml  Output 3370 ml  Net -2530 ml     Pressure Injury 03/26/21 Toe (Comment  which one) Anterior;Left Unstageable - Full thickness tissue loss in which the base of the injury is covered by slough (yellow, tan, gray, green or brown) and/or eschar (tan, brown or black) in the wound bed. pressu (Active)  03/26/21 1423  Location: Toe (Comment  which one)  Location Orientation: Anterior;Left  Staging: Unstageable - Full thickness tissue loss in which the base of the injury is covered by slough (yellow, tan, gray, green or brown) and/or eschar (tan, brown or black) in the wound bed.  Wound Description (Comments): pressure injury on medial aspect of great toe knuckle  Present on Admission: Yes    Physical Exam: Vital Signs Blood pressure (!) 143/69, pulse 61, temperature 98 F (36.7 C), resp. rate 17, height 6' (1.829 m), weight 103.7 kg, SpO2 100 %. Constitutional: No distress . Vital signs reviewed. HENT: Normocephalic.  Atraumatic. Eyes: EOMI. No discharge. Cardiovascular: No JVD.  Irregularly irregular. Respiratory: Normal effort.  No stridor.  Bilateral clear to  auscultation. GI: Non-distended.  BS +. Skin: Warm and dry.   Left lower extremity first digit ulcer with dressing Right BKA with dressing CDI Psych: Normal mood.  Normal behavior. Musc: Left BKA with edema and tenderness, improving Neuro: Alert Motor: 5/5 throughout, grossly unchanged  Assessment/Plan: 1. Functional deficits which require 3+ hours per day of interdisciplinary therapy in a comprehensive inpatient rehab setting.  Physiatrist is providing close team supervision and 24 hour management of active medical problems listed below.  Physiatrist and rehab team continue to assess barriers to discharge/monitor patient progress toward functional and medical goals  Care Tool:  Bathing    Body parts bathed by patient: Right arm,Left arm,Chest,Abdomen,Front perineal area,Right upper leg,Left upper leg,Face   Body parts bathed by helper: Left lower leg,Buttocks Body parts n/a: Right lower leg   Bathing assist Assist Level: Minimal Assistance - Patient > 75%     Upper Body Dressing/Undressing Upper body dressing   What is the patient wearing?: Pull over shirt    Upper body assist Assist Level: Set up assist    Lower Body Dressing/Undressing Lower body dressing      What is the patient wearing?: Underwear/pull up,Pants  Lower body assist Assist for lower body dressing: Maximal Assistance - Patient 25 - 49%     Toileting Toileting Toileting Activity did not occur (Clothing management and hygiene only): N/A (no void or bm)  Toileting assist Assist for toileting: Minimal Assistance - Patient > 75%     Transfers Chair/bed transfer  Transfers assist     Chair/bed transfer assist level: Minimal Assistance - Patient > 75%     Locomotion Ambulation   Ambulation assist      Assist level: Moderate Assistance - Patient 50 - 74% Assistive device: Walker-rolling Max distance: 4'   Walk 10 feet activity   Assist  Walk 10 feet activity did not occur:  Safety/medical concerns        Walk 50 feet activity   Assist Walk 50 feet with 2 turns activity did not occur: Safety/medical concerns         Walk 150 feet activity   Assist Walk 150 feet activity did not occur: Safety/medical concerns         Walk 10 feet on uneven surface  activity   Assist Walk 10 feet on uneven surfaces activity did not occur: Safety/medical concerns         Wheelchair     Assist Will patient use wheelchair at discharge?: Yes Type of Wheelchair: Manual    Wheelchair assist level: Supervision/Verbal cueing Max wheelchair distance: 100'    Wheelchair 50 feet with 2 turns activity    Assist        Assist Level: Supervision/Verbal cueing   Wheelchair 150 feet activity     Assist  Wheelchair 150 feet activity did not occur:  (SOB)   Assist Level: Moderate Assistance - Patient 50 - 74% (pt propelled 100 ft)    Medical Problem List and Plan: 1.  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  Wound VAC removed 03/24/2021.  Continue CIR 2.  Antithrombotics: -DVT/anticoagulation: Eliquis             -antiplatelet therapy: N/A 3. Pain Management: Neurontin 300 mg 3 times daily, oxycodone as needed             Controlled with meds on 4/26  Monitor with increased exertion, particularly for phantom limb pain 4. Mood: Lexapro 10 mg daily, Ativan 1 mg nightly             -antipsychotic agents: N/A 5. Neuropsych: This patient is capable of making decisions on his own behalf. 6. Skin/Wound Care: continue shrinker to stump  -foam dressing to left great toe -surgery to evaluate, awaiting 7. Fluids/Electrolytes/Nutrition: Routine in and outs 8.  Acute blood loss anemia.  Continue iron supplement.    Hemoglobin 8.8 on 4/25  Continue to monitor 9.  Hypertension.  Lopressor 12.5 mg twice daily.               Borderline elevated on 4/26 10.  Diabetes mellitus with hyperglycemia.  Latest hemoglobin  A1c 7.8.  NovoLog 4 units 3 times daily with meals, Lantus insulin 12 units nightly.  Check blood sugars before meals and at bedtime            Labile on 4/26 11.  GERD.  Protonix 12.  Dyslipidemia: Lipitor 13.  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  Pacemaker removed 12/14/2020 due to staph bacteremia.  Follow-up cardiology services 14.  Idiopathic pulmonary fibrosis.  Continue CPAP 15.  Atrial fibrillation.  Cardiac rate controlled, regular.  Continue Eliquis.  Monitor increase activity 16.  CAD with CABG.  No chest pain or increasing shortness of breath 17.  CKD stage II/III.    Creatinine 1.17 on 4/25  Continue to monitor 18.  Hyponatremia  Sodium 131 on 4/25  Continue to monitor 19. Pulmonary fibrosis   Infusion today  Will consider nebs after   LOS: 4 days A FACE TO FACE EVALUATION WAS PERFORMED  Shane [name] Lorie Phenix 03/30/2021, 9:21 AM

## 2021-03-30 NOTE — Progress Notes (Signed)
Physical Therapy Session Note  Patient Details  Name: Shane [name] MRN: [id number] Date of Birth: [birth year]  Today's Date: 03/30/2021 PT Individual Time:Session1: 0930-1030; Arthor Captain: [id number] PT Individual Time Calculation (min): 60 min & 30 min  Short Term Goals: Week 1:  PT Short Term Goal 1 (Week 1): Pt will perform supine<>sit mod-I PT Short Term Goal 2 (Week 1): Pt will perform sit<>stands using LRAD with CGA PT Short Term Goal 3 (Week 1): Pt will perform bed<>chair transfers using LRAD with CGA PT Short Term Goal 4 (Week 1): Pt will propel wheelchair at least 142ft with supervision PT Short Term Goal 5 (Week 1): Pt will ambulate at least 57ft using LRAD with CGA  Skilled Therapeutic Interventions/Progress Updates:    Session 1:  Patient in w/c reports just showered.  Patient propelled w/c in hallway over 100' with S, limited by arm fatigue.  Pushed to ortho gym and performed car transfer to simulated [employer] 27.5" with min A stand pivot with RW.  W/c to mat transfer with CGA and cues for increased clearance with transfers.  Patient performed sit to supine on mat with S.  Assisted to doff limb protector and performed therex as noted below.  Rolling on mat for transitions with S as well.  Supine to sit min A and cues for UE placement.  Patient transferred with CGA to w/c to higher surface.  Performed w/c push ups x 5.  Pushed in w/c to room.  Transfer to bed using bed rail with CGA.  Sit to supine with S and left with call bell/needs in reach, bed alarm active.  Called [employer] to request new lower strap for limb protector due to velcro coming off.   [place]: Patient in w/c reports had infusion for pulmonary fibrosis (part of clinical trial).  Patient without complaints except fatigue.  Patient pushed in w/c to therapy gym.  Performed sit to stand in parallel bars with min A and ambulated 5' x 4 with limited L foot clearance at times, each time fixing slipper sock due to binding up and  under his foot.  Discussed his wife to bring in post op shoe to try to protect his great toe due to ulcer on it.  Patient assisted in w/c to room and pivot to bed with CGA to close S.  Sit to supine with S.  Limb protector removed and left with all needs in reach and bed alarm active.   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 Restrictions Weight Bearing Restrictions: Yes RLE Weight Bearing: Non weight bearing Pain: Pain Assessment Pain Score: 0-No pain Exercises: Amputee Exercises Quad Sets: Strengthening;Right;10 reps;Supine Towel Squeeze: Strengthening;Both;10 reps;Supine Hip Extension: Strengthening;Right;Prone;10 reps Hip ABduction/ADduction: Strengthening;Right;10 reps;Sidelying Knee Flexion: Strengthening;Right;10 reps (prone) Knee Extension: Strengthening;Left;Right;10 reps;Seated (5# on L) Straight Leg Raises: Strengthening;Right;10 reps;Supine Other Exercises Other Exercises: Bridging on L with R leg lift 3 sec hold x 10   Therapy/Group: Individual Therapy  Reginia Naas  [place], PT 03/30/2021, 10:10 AM

## 2021-03-31 LAB — GLUCOSE, CAPILLARY
Glucose-Capillary: 80 mg/dL (ref 70–99)
Glucose-Capillary: 127 mg/dL — ABNORMAL HIGH (ref 70–99)
Glucose-Capillary: 205 mg/dL — ABNORMAL HIGH (ref 70–99)
Glucose-Capillary: 196 mg/dL — ABNORMAL HIGH (ref 70–99)

## 2021-03-31 MED ORDER — INSULIN GLARGINE 100 UNIT/ML ~~LOC~~ SOLN
8.0000 [IU] | Freq: Every day | SUBCUTANEOUS | Status: DC
  Administered 2021-03-31 – 2021-04-12 (×13): 8 [IU] via SUBCUTANEOUS
  Filled 2021-03-31 (×17): qty 0.08

## 2021-03-31 MED ORDER — ALBUTEROL SULFATE (2.5 MG/3ML) 0.083% IN NEBU
2.5000 mg | INHALATION_SOLUTION | Freq: Four times a day (QID) | RESPIRATORY_TRACT | Status: DC
  Administered 2021-03-31: 2.5 mg via RESPIRATORY_TRACT
  Filled 2021-03-31: qty 3

## 2021-03-31 MED ORDER — COLLAGENASE 250 UNIT/GM EX OINT
TOPICAL_OINTMENT | Freq: Every day | CUTANEOUS | Status: DC
  Filled 2021-03-31: qty 30

## 2021-03-31 MED ORDER — ALBUTEROL SULFATE (2.5 MG/3ML) 0.083% IN NEBU: 2.5000 mg | INHALATION_SOLUTION | Freq: Two times a day (BID) | RESPIRATORY_TRACT | Status: DC

## 2021-03-31 NOTE — Progress Notes (Signed)
Physical Therapy Session Note  Patient Details  Name: Shane [name] MRN: [id number] Date of Birth: Dec 13, 1940  Today's Date: 03/31/2021 PT Individual Time: 0930-1030 PT Individual Time Calculation (min): 60 min   Short Term Goals: Week 1:  PT Short Term Goal 1 (Week 1): Pt will perform supine<>sit mod-I PT Short Term Goal 2 (Week 1): Pt will perform sit<>stands using LRAD with CGA PT Short Term Goal 3 (Week 1): Pt will perform bed<>chair transfers using LRAD with CGA PT Short Term Goal 4 (Week 1): Pt will propel wheelchair at least 124ft with supervision PT Short Term Goal 5 (Week 1): Pt will ambulate at least 4ft using LRAD with CGA  Skilled Therapeutic Interventions/Progress Updates:    Patient in supine and without complaints.  MD had removed dressing on residual limb and shrinker still off so replaced shrinker and applied limb protector.  Patient supine to sit with rail and S.  Reports wife brought in shoe so applied to L foot in sitting, but noted velcro not lining up correctly.  Patient squat pivot to w/c CGA.  Propelled 86' with S then PT pushed to gym for energy conservation.  In parallel bars able to hop with min A 5' to armchair then 5' back to w/c, but increased time and occasional difficulty getting L foot to clear with shoe on.  Patient sit to stand to RW with min A increased time to balance.  Ambulated 6' with RW and min A w/c close following.  Patient unable to pivot to sit on mat so brought w/c close, able to lock 1 brake, then pt sat uncontrolled on edge of seat with R brake still unlocked, chair move and pt slid with A to floor.  Patient without injury, RN aware and vitals taken.  Used maximove lift to assist up to w/c.  PAtient assessed by RN in chair and rested.  Performed sit to stand to RW x 2 with min A, mod cues and increased time to get balance forward on L foot.  Patient assisted in w/c to room and performed pivot to bed using bedrail with CGA.  Patient left EOB with  nurse in the room.   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

## 2021-03-31 NOTE — Patient Care Conference (Signed)
Inpatient RehabilitationTeam Conference and Plan of Care Update Date: 03/31/2021   Time: 11:51 AM    Patient Name: Shane [name]      Medical Record Number: [id number]  Date of Birth: 1941-08-19 Sex: Male         Room/Bed: 4M11C/4M11C-01 Payor Info: Payor: [employer] MEDICARE / Plan: [facility] MEDICARE / Product Type: *No Product type* /    Admit Date/Time:  03/26/2021 12:42 PM  Primary Diagnosis:  Right below-knee amputee [facility])  Hospital Problems: Principal Problem:   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[place]) Active Problems:   Hyponatremia   Labile blood glucose   Pulmonary fibrosis ([place])    Expected Discharge Date: Expected Discharge Date: (P) 04/13/21  Team Members Present: Physician leading conference: (P) Dr. Delice Lesch Care Coodinator Present: (P) Dorien Chihuahua, RN, BSN, CRRN;Becky Dupree, LCSW Nurse Present: (P) Dorien Chihuahua, RN PT Present: Mamie Nick) Magda Kiel, PT OT Present: Mamie Nick) Simonne Come, OT PPS Coordinator present : Mamie Nick) Ileana Ladd, PT     Current Status/Progress Goal Weekly Team Focus  Bowel/Bladder   Continent of Bowel and Bladder. LBM 03/30/2021  Remain continent  Assess Qshift and prn   Swallow/Nutrition/ Hydration             ADL's   funcitonal transfers - sit pivot min A, SPT with RW min/mod A, bathing min A, dressing min A  CS/CGA  upper body conditioning, adl / transfer training, use of assistive devices, energy conservation, family education   Mobility   CGA bed/w/c transfers, min A sit to stand to RW and gait up to 6' min A RW, limited foot clearance, car transfer w/ RW min A  S to CGA, 25' gait with RW.  LE strength, balance, standing tolerance, gait, transfers   Communication             Safety/Cognition/ Behavioral Observations            Pain   Intermittent c/o of pain to right BKA. PRN tramadol.  Pain <3/10  Assess Pain qshift and PRN   Skin   Right BKA, incision closed with staples. Unstageble wound to left great toe.  Promote wound healing and  prevention of infection.  assess Qshift and PRN     Discharge Planning:  Home with wife who works but they have hired assist in the past to be with pt while she is working. Will inform pt and wife he will need 24/7 supervision at DC   Team Discussion: BP borderline, MD adjusting meds. Progress limited by upper body weakness and pulmonary fibrosis. Patient on target to meet rehab goals: yes, currently min assist ambulating with a walker and post op shoe for left foot. Min assist for squat pivot transfers and bathing/dressing with supervision - CGA goals set for discharge.  *See Care Plan and progress notes for long and short-term goals.   Revisions to Treatment Plan:  Working on balance tolerance and gait and transfers.  Teaching Needs: ADL and transfers training, energy conservation tips, residual limb care, medications, etc.   Current Barriers to Discharge: Decreased caregiver support and Home enviroment access/layout  Possible Resolutions to Barriers: Family education with wife/son     Medical Summary Current Status: Decreased functional mobility secondary to right BKA 03/17/2021 due to gangrene.  Barriers to Discharge: Weight;Medical stability;Wound care   Possible Resolutions to Barriers/Weekly Focus: Therapies, follow labs - Hb, Cr, Hb, optimize DM meds, SOB, santyl started   Continued Need for Acute Rehabilitation Level of Care: The patient requires  daily medical management by a physician with specialized training in physical medicine and rehabilitation for the following reasons: Direction of a multidisciplinary physical rehabilitation program to maximize functional independence : Yes Medical management of patient stability for increased activity during participation in an intensive rehabilitation regime.: Yes Analysis of laboratory values and/or radiology reports with any subsequent need for medication adjustment and/or medical intervention. : Yes   I attest that I was  present, lead the team conference, and concur with the assessment and plan of the team.   Margarito Liner 03/31/2021, 3:38 PM

## 2021-03-31 NOTE — Progress Notes (Signed)
PROGRESS NOTE   Subjective/Complaints: Patient seen laying in bed this morning.  He states she slept well overnight.  He notes he had his infusion yesterday without issues.  He notes persistent cough.  Discussed dressings with nursing.  ROS: + Shortness of breath (baseline).  Denies CP, N/V/D  Objective:   No results found. Recent Labs    03/29/21 0427  WBC 7.4  HGB 8.8*  HCT 27.6*  PLT 320   Recent Labs    03/29/21 0427  NA 131*  K 4.5  CL 99  CO2 26  GLUCOSE 97  BUN 18  CREATININE 1.17  CALCIUM 8.5*    Intake/Output Summary (Last 24 hours) at 03/31/2021 1010 Last data filed at 03/31/2021 3664 Gross per 24 hour  Intake 358 ml  Output 1175 ml  Net -817 ml     Pressure Injury 03/26/21 Toe (Comment  which one) Anterior;Left Unstageable - Full thickness tissue loss in which the base of the injury is covered by slough (yellow, tan, gray, green or brown) and/or eschar (tan, brown or black) in the wound bed. pressu (Active)  03/26/21 1423  Location: Toe (Comment  which one)  Location Orientation: Anterior;Left  Staging: Unstageable - Full thickness tissue loss in which the base of the injury is covered by slough (yellow, tan, gray, green or brown) and/or eschar (tan, brown or black) in the wound bed.  Wound Description (Comments): pressure injury on medial aspect of great toe knuckle  Present on Admission: Yes    Physical Exam: Vital Signs Blood pressure 127/61, pulse 60, temperature 98.3 F (36.8 C), temperature source Oral, resp. rate 16, height 6' (1.829 m), weight 103.7 kg, SpO2 99 %. Constitutional: No distress . Vital signs reviewed. HENT: Normocephalic.  Atraumatic. Eyes: EOMI. No discharge. Cardiovascular: No JVD.  Irregularly irregular. Respiratory: Normal effort.  No stridor.  Bilateral clear to auscultation. GI: Non-distended.  BS +. Skin: Warm and dry.   Left lower extremity with first digit  lateral ulcer with slough Right BKA with staples CDI Psych: Normal mood.  Normal behavior. Musc: Left BKA with edema and tenderness, improving Neuro: Alert Motor: 5/5 throughout, grossly stable  Assessment/Plan: 1. Functional deficits which require 3+ hours per day of interdisciplinary therapy in a comprehensive inpatient rehab setting.  Physiatrist is providing close team supervision and 24 hour management of active medical problems listed below.  Physiatrist and rehab team continue to assess barriers to discharge/monitor patient progress toward functional and medical goals  Care Tool:  Bathing    Body parts bathed by patient: Right arm,Left arm,Chest,Abdomen,Front perineal area,Right upper leg,Left upper leg,Face   Body parts bathed by helper: Left lower leg,Buttocks Body parts n/a: Right lower leg   Bathing assist Assist Level: Minimal Assistance - Patient > 75%     Upper Body Dressing/Undressing Upper body dressing   What is the patient wearing?: Pull over shirt    Upper body assist Assist Level: Set up assist    Lower Body Dressing/Undressing Lower body dressing      What is the patient wearing?: Underwear/pull up,Pants     Lower body assist Assist for lower body dressing: Minimal Assistance - Patient > 75%  Toileting Toileting Toileting Activity did not occur Landscape architect and hygiene only): N/A (no void or bm)  Toileting assist Assist for toileting: Minimal Assistance - Patient > 75%     Transfers Chair/bed transfer  Transfers assist     Chair/bed transfer assist level: Minimal Assistance - Patient > 75%     Locomotion Ambulation   Ambulation assist      Assist level: Moderate Assistance - Patient 50 - 74% Assistive device: Walker-rolling Max distance: 4'   Walk 10 feet activity   Assist  Walk 10 feet activity did not occur: Safety/medical concerns        Walk 50 feet activity   Assist Walk 50 feet with 2 turns activity  did not occur: Safety/medical concerns         Walk 150 feet activity   Assist Walk 150 feet activity did not occur: Safety/medical concerns         Walk 10 feet on uneven surface  activity   Assist Walk 10 feet on uneven surfaces activity did not occur: Safety/medical concerns         Wheelchair     Assist Will patient use wheelchair at discharge?: Yes Type of Wheelchair: Manual    Wheelchair assist level: Supervision/Verbal cueing Max wheelchair distance: 100'    Wheelchair 50 feet with 2 turns activity    Assist        Assist Level: Supervision/Verbal cueing   Wheelchair 150 feet activity     Assist  Wheelchair 150 feet activity did not occur:  (SOB)   Assist Level: Moderate Assistance - Patient 50 - 74% (pt propelled 100 ft)    Medical Problem List and Plan: 1.  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  Wound VAC removed 03/24/2021.  Continue CIR  Team conference today to discuss current and goals and coordination of care, home and environmental barriers, and discharge planning with nursing, case manager, and therapies. Please see conference note from today as well.  2.  Antithrombotics: -DVT/anticoagulation: Eliquis             -antiplatelet therapy: N/A 3. Pain Management: Neurontin 300 mg 3 times daily, oxycodone as needed             Controlled with meds on 4/27  Monitor with increased exertion, particularly for phantom limb pain 4. Mood: Lexapro 10 mg daily, Ativan 1 mg nightly             -antipsychotic agents: N/A 5. Neuropsych: This patient is capable of making decisions on his own behalf. 6. Skin/Wound Care: continue shrinker to stump  -Santyl ordered to left great toe -surgery to evaluate, continue to await 7. Fluids/Electrolytes/Nutrition: Routine in and outs 8.  Acute blood loss anemia.  Continue iron supplement.    Hemoglobin 8.8 on 4/25  Continue to monitor 9.  Hypertension.   Lopressor 12.5 mg twice daily.               Borderline elevated on 4/26 10.  Diabetes mellitus with hyperglycemia.  Latest hemoglobin A1c 7.8.    NovoLog 4 units 3 times daily with meals, will likely need to increase  Lantus insulin 12 units nightly decreased to 8 on 4/27.    Check blood sugars before meals and at bedtime  Labile on 4/27 11.  GERD.  Protonix 12.  Dyslipidemia: Lipitor 13.  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  Pacemaker removed 12/14/2020 due to staph bacteremia.  Follow-up  cardiology services 14.  Idiopathic pulmonary fibrosis.  Continue CPAP 15.  Atrial fibrillation.  Cardiac rate controlled, regular.  Continue Eliquis.             Monitor increase activity 16.  CAD with CABG.  No chest pain or increasing shortness of breath 17.  CKD stage II/III.    Creatinine 1.17 on 4/25  Continue to monitor 18.  Hyponatremia  Sodium 131 on 4/25  Continue to monitor 19. Pulmonary fibrosis   Last infusion on 4/26  Albuterol scheduled (?  Home medication), as patient not taking   LOS: 5 days A FACE TO FACE EVALUATION WAS PERFORMED  Shane [name] Lorie Phenix 03/31/2021, 10:10 AM

## 2021-03-31 NOTE — Progress Notes (Signed)
Patient ID: Shane [name], male   DOB: 02-23-1941, 80 y.o.   MRN: [id number]  Met with pt and spoke with wife via telephone to discuss team conference goals supervision level and target discharge date of 5/10. Pt and wife are pleased with this and feel he will be ready by then. Pt realizes he has work to do after PT session today and being lowered to the floor due to leg gave out. Wife will get his equipment needed since works for [employer]. Will continue to work on discharge needs.

## 2021-03-31 NOTE — Progress Notes (Signed)
Was the fall witnessed: Yes  Patient condition before and after the fall: Stable  Patient's reaction to the fall: no injury, no complaints of pain or discomfort  Name of the doctor that was notified including date and time: Patel MD, 34 AM  Any interventions and vital signs: Neurovitals stable

## 2021-03-31 NOTE — Progress Notes (Signed)
Occupational Therapy Session Note  Patient Details  Name: Kaulder Zahner MRN: [id number] Date of Birth: March 08, 1941  Today's Date: 03/31/2021 OT Individual Time: 1405-1501 OT Individual Time Calculation (min): 56 min    Short Term Goals: Week 1:  OT Short Term Goal 1 (Week 1): With use of AE, pt will complete LB dressing with min assist OT Short Term Goal 2 (Week 1): Pt will complete toileting hygiene and clothing management with min assist OT Short Term Goal 3 (Week 1): Pt demonstrate dynamic standing balance with CGA during functional ADL/IADL task.  Skilled Therapeutic Interventions/Progress Updates:    Pt completed supine to sit EOB with supervision and then worked on donning his right limb guard and post op shoe on the left foot.  He was able to donn the limb guard but needed min assist to get his residual limb out to the end and tighten the straps.  Provided step stool for helping to donn the left shoe, but he still needed mod assist secondary to decreased flexibility.  Next, he completed stand pivot transfer to the wheelchair with use of the RW and mod assist.  Increased difficulty noted taking hops on the LLE.  He was able to roll himself down to the nurses station with supervision and then therapist assisted with transition to the therapy gym.  Min assist squat pivot transfers completed in the gym to the mat.  Worked on sit to stand and standing balance while using alternating UEs for peg design task.  Min assist for sit to stand as well as standing balance for intervals of 1-2 mins before needing rest.  Greater difficulty noted when releasing the RUE for task compared to the left.  Finished session with transfer back to the wheelchair and back to the bed at min assist squat pivot.  He needed mod assist for removal of limb guard and post op shoe.  Sit to supine at supervision with call button and phone in reach at end of session.   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

## 2021-03-31 NOTE — Progress Notes (Signed)
Occupational Therapy Session Note  Patient Details  Name: Shane [name] MRN: [id number] Date of Birth: 23-Jun-1941  Today's Date: 03/31/2021 OT Individual Time: 0700-0800 OT Individual Time Calculation (min): 60 min    Short Term Goals: Week 1:  OT Short Term Goal 1 (Week 1): With use of AE, pt will complete LB dressing with min assist OT Short Term Goal 2 (Week 1): Pt will complete toileting hygiene and clothing management with min assist OT Short Term Goal 3 (Week 1): Pt demonstrate dynamic standing balance with CGA during functional ADL/IADL task.  Skilled Therapeutic Interventions/Progress Updates:    1:1. Pt received bed with no pain finishing breakfast. Pt completes sup>sit with S with bed rails. Pt changes shirt with set up, pants with S to doff using lateral leans and cuing and MIN A to don at sit to stand level as pt demo difficulty with trunk control at EOB during leaning losing balance when attempting to pull pants up/reach back. Pt cued to use back scratcher to thread RLE into pants d/t poor reach/flexibilty. Pt with MIN A for SPT to w/c using RW with cuing to have safe hand placement. Pt cued ot use reacher and sock aide in w/c to don footwear. MIN A for offloading shoe. Grooming at sink with set up. Pt completes w/c trunk control exercises with w/c sit ups 2x10 with 3Kg med ball with VC for not bouncing off back of chair and lateral slides with stool to strengthen obliques. 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

## 2021-04-01 LAB — GLUCOSE, CAPILLARY
Glucose-Capillary: 98 mg/dL (ref 70–99)
Glucose-Capillary: 143 mg/dL — ABNORMAL HIGH (ref 70–99)
Glucose-Capillary: 99 mg/dL (ref 70–99)
Glucose-Capillary: 156 mg/dL — ABNORMAL HIGH (ref 70–99)

## 2021-04-01 MED ORDER — ALBUTEROL SULFATE (2.5 MG/3ML) 0.083% IN NEBU
2.5000 mg | INHALATION_SOLUTION | Freq: Four times a day (QID) | RESPIRATORY_TRACT | Status: DC
  Administered 2021-04-01 – 2021-04-02 (×2): 2.5 mg via RESPIRATORY_TRACT
  Filled 2021-04-01 (×2): qty 3

## 2021-04-01 NOTE — Progress Notes (Signed)
Occupational Therapy Session Note  Patient Details  Name: Shane [name] MRN: [id number] Date of Birth: 1941-07-22  Today's Date: 04/01/2021 OT Individual Time: 1601-0932 OT Individual Time Calculation (min): 60 min    Short Term Goals: Week 1:  OT Short Term Goal 1 (Week 1): With use of AE, pt will complete LB dressing with min assist OT Short Term Goal 2 (Week 1): Pt will complete toileting hygiene and clothing management with min assist OT Short Term Goal 3 (Week 1): Pt demonstrate dynamic standing balance with CGA during functional ADL/IADL task.  Skilled Therapeutic Interventions/Progress Updates:    Patient in bed, alert and ready for therapy session.  He denies pain, reviewed circumstances of fall yesterday and reinforced energy conservation and fatigue awareness.  Supine to sitting edge of bed CS.  SPT with RW (Darco shoe left) CGA.  Covered left toe, incision and iv site for shower.  Sit pivot transfer to/from shower bench with CGA.  Sit to stand in shower with grab bars for clothing management CGA.  Shower completed with long handled sponge, hand held shower in seated position with distant supervision - he requires assist to wash buttocks in stance - difficulty with lateral leans due to back ROM deficits (prior back surgery).  Dressing completed seated on shower bench - set up for [facility] shirt, min A underwear, CS pants over feet, CM in stance with grab bars min/mod A.  Max A for darco shoe on left foot.  Able to [employer].  Min A for amp shield.  He completes grooming tasks at sink in seated position mod I.  Standing with RW tolerated for 2 minutes with practice releasing one hand at a time.  He remained seated in w/c at close of session, seat belt alarm set and call bell/tray table in reach.    Therapy Documentation Precautions:  Precautions Precautions: Fall,Other (comment) Precaution Comments: monitor BP for orthostatic hypotension, SpO2 due pulmonary hx, chronic wound on L  toe Restrictions Weight Bearing Restrictions: Yes RLE Weight Bearing: Non weight bearing   Therapy/Group: Individual Therapy  Carlos Levering 04/01/2021, 7:48 AM

## 2021-04-01 NOTE — Progress Notes (Signed)
Physical Therapy Session Note  Patient Details  Name: Shane [name] MRN: [id number] Date of Birth: 06-Oct-1941  Today's Date: 04/01/2021 PT Individual Time:Session1: 1000-1100; Arthor Captain: 4401-0272 PT Individual Time Calculation (min): 60 min & 45 min  Short Term Goals: Week 1:  PT Short Term Goal 1 (Week 1): Pt will perform supine<>sit mod-I PT Short Term Goal 2 (Week 1): Pt will perform sit<>stands using LRAD with CGA PT Short Term Goal 3 (Week 1): Pt will perform bed<>chair transfers using LRAD with CGA PT Short Term Goal 4 (Week 1): Pt will propel wheelchair at least 130ft with supervision PT Short Term Goal 5 (Week 1): Pt will ambulate at least 37ft using LRAD with CGA  Skilled Therapeutic Interventions/Progress Updates:    Session1: Patient in w/c following OT with showering and dressing.  Patient pushed in w/c to therapy gym.  Performed sit to stand at hi-lo table with min a to RW and standing with intermittent 1 UE support to play checkers.  Standing up to ~2 minutes at a time playing over 3 bouts of standing with min a for balance.  Patient transferred to mat squat pivot with min A.  Patient to supine with S and performed therex as noted below.  Transitions to side and prone and back with S and increased time.  Supine to sit min A and pt performed seated LAQ with 5# on L x 10 each. Patient transferred to w/c with squat pivot with min A.  Performed w/c mobility wearing w/c gloves with S x 100' till fatigued.  Assisted to room in w/c and transfer to bed with CGA using bed rail.  Patient doffed limb protector and shoe with A.  S sit to supine and left with call bell in reach and bed alarm active,  Session2:  Patient in supine still working on  lunch.  Performed supine to sit with S.  Donned limb protector and L shoe max A for time management.  Patient transfer to w/c squat pivot min to CGA.  Patient in w/c assisted to ortho gym.  Performed car transfer to simulated small SUV height with min  A using "grabbar" squat pivot.  Patient sit to stand to RW min A and ambulated 4' with RW and min A, cues for stopping prior to full fatigue.  Patient then ambulated 7' with RW with w/c follow with min A.  Patient standing at [facility] for tapping targets over screen with min to mod A for balance and 1 UE supported on RW x 2 x 1 minute trials with increased support for balance due to fatigue.  Patient in w/c assisted to room.  Left seated in w/c with alarm belt active, call bell in reach and table close to finish lunch.   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 Restrictions Weight Bearing Restrictions: Yes RLE Weight Bearing: Non weight bearing Pain: Pain Assessment Pain Score: 0-No pain   Exercises: General Exercises - Upper Extremity Shoulder Flexion: Strengthening;Both;Prone;5 reps (POE alternating sliding one arm fwd) Total Joint Exercises Bridges: Strengthening;Right Amputee Exercises Quad Sets: Strengthening;Right;10 reps;Supine (5 sec hold) Towel Squeeze: Strengthening;Both;10 reps;Supine Hip Extension: Strengthening;Right;Prone;10 reps Hip ABduction/ADduction: Strengthening;Right;10 reps;Sidelying Knee Flexion: Strengthening;Right;10 reps (prone) Straight Leg Raises: Strengthening;Right;10 reps;Supine Chair Push Up: AROM;Both;5 reps    Therapy/Group: Individual Therapy  Reginia Naas  Magda Kiel, PT 04/01/2021, 10:59 AM

## 2021-04-01 NOTE — Progress Notes (Signed)
**  Late entry**  03/31/21 1012  What Happened  Was fall witnessed? Yes  Who witnessed fall? Cyndi PT  Patients activity before fall ambulating-assisted  Point of contact hip/leg  Was patient injured? No  Follow Up  MD notified Posey Pronto  Time MD notified 75  Family notified Yes - comment (called and left a message to daughter after 15 minutes on hold for wife)  Time family notified 1107  Additional tests No  Adult Fall Risk Assessment  Risk Factor Category (scoring not indicated) Fall has occurred during this admission (document High fall risk)  Age 80  Fall History: Fall within 6 months prior to admission 5  Elimination; Bowel and/or Urine Incontinence 0  Elimination; Bowel and/or Urine Urgency/Frequency 0  Medications: includes PCA/Opiates, Anti-convulsants, Anti-hypertensives, Diuretics, Hypnotics, Laxatives, Sedatives, and Psychotropics 5  Patient Care Equipment 1  Mobility-Assistance 2  Mobility-Gait 2  Mobility-Sensory Deficit 0  Altered awareness of immediate physical environment 0  Impulsiveness 0  Lack of understanding of one's physical/cognitive limitations 0  Total Score 17  Patient Fall Risk Level High fall risk  Adult Fall Risk Interventions  Required Bundle Interventions *See Row Information* High fall risk - low, moderate, and high requirements implemented  Additional Interventions Use of appropriate toileting equipment (bedpan, BSC, etc.)  Screening for Fall Injury Risk (To be completed on HIGH fall risk patients) - Assessing Need for Floor Mats  Risk For Fall Injury- Criteria for Floor Mats None identified - No additional interventions needed  Will Implement Floor Mats Yes  Vitals  Temp 98.7 F (37.1 C)  Temp Source Oral  BP (!) 115/47  BP Location Left Arm  BP Method Automatic  Patient Position (if appropriate) Sitting  Pulse Rate 67  Resp 15  Oxygen Therapy  SpO2 100 %  O2 Device Room Air  Pain Assessment  Pain Scale 0-10  Pain Score 0  Neurological   Neuro (WDL) X  Level of Consciousness Alert  Orientation Level Oriented X4  Cognition Appropriate at baseline  Speech Clear  R Hand Grip Moderate  L Hand Grip Moderate   RUE Motor Response Purposeful movement  LUE Motor Response Purposeful movement  RLE Motor Response Purposeful movement  LLE Motor Response Purposeful movement  Musculoskeletal  Musculoskeletal (WDL) X  Assistive Device Wheelchair  Generalized Weakness Yes  Weight Bearing Restrictions Yes  RLE Weight Bearing NWB  Musculoskeletal Details  RLE BKA

## 2021-04-01 NOTE — Progress Notes (Signed)
   03/31/21 1151  Patient Care Conference  Initial Conference Date 03/31/21  Next Conference Date 04/07/21  Team Members Present  Team Members Present Physician;Nurse;Care Coordinator;Physical Therapist;PPS Coordinator;Occupational Therapist  Physician leading conference Dr. Delice Lesch  Nurse Present Dorien Chihuahua, RN  Care Coodinator Present Dorien Chihuahua, RN, BSN, CRRN;Becky Dupree, LCSW  PT Present Magda Kiel, PT  OT Present Simonne Come, OT  PPS Coordinator present  Ileana Ladd, PT  Expected Discharge  Expected Discharge Date 04/13/21

## 2021-04-01 NOTE — Progress Notes (Signed)
Occupational Therapy Session Note  Patient Details  Name: Shane [name] MRN: [id number] Date of Birth: January 29, 1941  Today's Date: 04/01/2021 OT Individual Time: 5301-0404 OT Individual Time Calculation (min): 31 min    Short Term Goals: Week 1:  OT Short Term Goal 1 (Week 1): With use of AE, pt will complete LB dressing with min assist OT Short Term Goal 2 (Week 1): Pt will complete toileting hygiene and clothing management with min assist OT Short Term Goal 3 (Week 1): Pt demonstrate dynamic standing balance with CGA during functional ADL/IADL task.  Skilled Therapeutic Interventions/Progress Updates:    Pt received seated in w/c, denies pain, agreeable to therapy. Reviewed residual limb care, pt reports ind in don/doffing his limb guard/shrinker and in preforming desenitization techniques. W/c transport to and from gym 2/2 time management and energy conservation. Stand-pivot wc<>mat table with light min A for balance, pt req increased time to power up into upright position. In prep for improved functional BUE strength/activity tolerance, completed 2x10 of forward/backward rows, chest/shoulder press, and B shoulder flexion with 5 lb dowel rod. SatO2 read at 100% before activity ,98% after activity on RA. Req rest break in between sets due to fatigue. Stand-pivot back to bed same manner as above. Returne dto supine with close S. Doffed limb guard with ind, A to remove darco shoe 2/2 time.    Pt left semi-reclined in bed with bed alarm engaged, call bell in reach, and all immediate needs met.    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 Restrictions Weight Bearing Restrictions: Yes RLE Weight Bearing: Non weight bearing Pain: Pain Assessment Pain Scale: 0-10 Pain Score: 0-No pain ADL: See Care Tool for more details.   Therapy/Group:  Individual Therapy  Volanda Napoleon MS, OTR/L  04/01/2021, 2:45 PM

## 2021-04-01 NOTE — Progress Notes (Signed)
PROGRESS NOTE   Subjective/Complaints: Patient seen sitting up this morning working with therapies.  He states he slept well overnight.  He notes persistent cough, which improved with breathing treatment, however he states he only received 1.  Discussed with therapies.  Breathing treatments appear to have been discontinued by respiratory.  He notes he still has not been evaluated by surgery.  ROS: + Shortness of breath (baseline).  Denies CP, N/V/D  Objective:   No results found. No results for input(s): WBC, HGB, HCT, PLT in the last 72 hours. No results for input(s): NA, K, CL, CO2, GLUCOSE, BUN, CREATININE, CALCIUM in the last 72 hours.  Intake/Output Summary (Last 24 hours) at 04/01/2021 1235 Last data filed at 04/01/2021 0725 Gross per 24 hour  Intake 478 ml  Output 1200 ml  Net -722 ml     Pressure Injury 03/26/21 Toe (Comment  which one) Anterior;Left Unstageable - Full thickness tissue loss in which the base of the injury is covered by slough (yellow, tan, gray, green or brown) and/or eschar (tan, brown or black) in the wound bed. pressu (Active)  03/26/21 1423  Location: Toe (Comment  which one)  Location Orientation: Anterior;Left  Staging: Unstageable - Full thickness tissue loss in which the base of the injury is covered by slough (yellow, tan, gray, green or brown) and/or eschar (tan, brown or black) in the wound bed.  Wound Description (Comments): pressure injury on medial aspect of great toe knuckle  Present on Admission: Yes    Physical Exam: Vital Signs Blood pressure (!) 135/59, pulse 60, temperature 97.9 F (36.6 C), resp. rate 18, height 6' (1.829 m), weight 103.7 kg, SpO2 98 %. Constitutional: No distress . Vital signs reviewed. HENT: Normocephalic.  Atraumatic. Eyes: EOMI. No discharge. Cardiovascular: No JVD.  Irregularly irregular. Respiratory: Normal effort.  No stridor.  Bilateral clear to  auscultation. GI: Non-distended.  BS +. Skin: Warm and dry.   Right BKA dressing CDI Left toe dressing CDI Psych: Normal mood.  Normal behavior. Musc: Left BKA with edema and tenderness, improving Neuro: Alert Motor: 5/5 throughout, grossly unchanged  Assessment/Plan: 1. Functional deficits which require 3+ hours per day of interdisciplinary therapy in a comprehensive inpatient rehab setting.  Physiatrist is providing close team supervision and 24 hour management of active medical problems listed below.  Physiatrist and rehab team continue to assess barriers to discharge/monitor patient progress toward functional and medical goals  Care Tool:  Bathing    Body parts bathed by patient: Right arm,Left arm,Chest,Abdomen,Front perineal area,Right upper leg,Left upper leg,Face,Left lower leg   Body parts bathed by helper: Buttocks Body parts n/a: Right lower leg   Bathing assist Assist Level: Minimal Assistance - Patient > 75%     Upper Body Dressing/Undressing Upper body dressing   What is the patient wearing?: Pull over shirt    Upper body assist Assist Level: Set up assist    Lower Body Dressing/Undressing Lower body dressing      What is the patient wearing?: Underwear/pull up,Pants     Lower body assist Assist for lower body dressing: Minimal Assistance - Patient > 75%     Toileting Toileting Toileting Activity did  not occur Landscape architect and hygiene only): N/A (no void or bm)  Toileting assist Assist for toileting: Minimal Assistance - Patient > 75%     Transfers Chair/bed transfer  Transfers assist     Chair/bed transfer assist level: Minimal Assistance - Patient > 75% (squat pivot)     Locomotion Ambulation   Ambulation assist      Assist level: Minimal Assistance - Patient > 75% Assistive device: Parallel bars Max distance: 5'   Walk 10 feet activity   Assist  Walk 10 feet activity did not occur: Safety/medical concerns         Walk 50 feet activity   Assist Walk 50 feet with 2 turns activity did not occur: Safety/medical concerns         Walk 150 feet activity   Assist Walk 150 feet activity did not occur: Safety/medical concerns         Walk 10 feet on uneven surface  activity   Assist Walk 10 feet on uneven surfaces activity did not occur: Safety/medical concerns         Wheelchair     Assist Will patient use wheelchair at discharge?: Yes Type of Wheelchair: Manual    Wheelchair assist level: Supervision/Verbal cueing Max wheelchair distance: 110'    Wheelchair 50 feet with 2 turns activity    Assist        Assist Level: Supervision/Verbal cueing   Wheelchair 150 feet activity     Assist  Wheelchair 150 feet activity did not occur:  (SOB)   Assist Level: Moderate Assistance - Patient 50 - 74% (pt propelled 100 ft)    Medical Problem List and Plan: 1.  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  Wound VAC removed 03/24/2021.  Continue CIR 2.  Antithrombotics: -DVT/anticoagulation: Eliquis             -antiplatelet therapy: N/A 3. Pain Management: Neurontin 300 mg 3 times daily, oxycodone as needed             Controlled with meds on 4/28  Monitor with increased exertion, particularly for phantom limb pain 4. Mood: Lexapro 10 mg daily, Ativan 1 mg nightly             -antipsychotic agents: N/A 5. Neuropsych: This patient is capable of making decisions on his own behalf. 6. Skin/Wound Care: continue shrinker to stump  -Santyl ordered to left great toe -surgery to evaluate, continue to await 7. Fluids/Electrolytes/Nutrition: Routine in and outs 8.  Acute blood loss anemia.  Continue iron supplement.    Hemoglobin 8.8 on 4/25  Continue to monitor 9.  Hypertension.  Lopressor 12.5 mg twice daily.               Controlled on 4/28 10.  Diabetes mellitus with hyperglycemia.  Latest hemoglobin A1c 7.8.    NovoLog 4 units 3  times daily with meals, will likely need to increase  Lantus insulin 12 units nightly decreased to 8 on 4/27.    Check blood sugars before meals and at bedtime  Remain slightly labile, will not consider medication adjustments today, given recent changes 11.  GERD.  Protonix 12.  Dyslipidemia: Lipitor 13.  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  Pacemaker removed 12/14/2020 due to staph bacteremia.  Follow-up cardiology services 14.  Idiopathic pulmonary fibrosis.  Continue CPAP 15.  Atrial fibrillation.  Cardiac rate controlled, regular.  Continue Eliquis.  Monitor increase activity 16.  CAD with CABG.  No chest pain or increasing shortness of breath 17.  CKD stage II/III.    Creatinine 1.17 on 4/25  Continue to monitor 18.  Hyponatremia  Sodium 131 on 4/25  Continue to monitor 19. Pulmonary fibrosis   Last infusion on 4/26  Albuterol scheduled (?  Home medication), discontinued by respiratory, reordered   LOS: 6 days A FACE TO FACE EVALUATION WAS PERFORMED  Annalia Metzger Lorie Phenix 04/01/2021, 12:35 PM

## 2021-04-02 LAB — GLUCOSE, CAPILLARY
Glucose-Capillary: 88 mg/dL (ref 70–99)
Glucose-Capillary: 129 mg/dL — ABNORMAL HIGH (ref 70–99)
Glucose-Capillary: 150 mg/dL — ABNORMAL HIGH (ref 70–99)
Glucose-Capillary: 128 mg/dL — ABNORMAL HIGH (ref 70–99)

## 2021-04-02 MED ORDER — ALBUTEROL SULFATE (2.5 MG/3ML) 0.083% IN NEBU
2.5000 mg | INHALATION_SOLUTION | Freq: Two times a day (BID) | RESPIRATORY_TRACT | Status: DC
  Administered 2021-04-02: 2.5 mg via RESPIRATORY_TRACT
  Filled 2021-04-02: qty 3

## 2021-04-02 MED ORDER — TRAMADOL HCL 50 MG PO TABS
50.0000 mg | ORAL_TABLET | Freq: Three times a day (TID) | ORAL | Status: DC | PRN
  Administered 2021-04-02 – 2021-04-12 (×11): 50 mg via ORAL
  Filled 2021-04-02 (×12): qty 1

## 2021-04-02 MED ORDER — MAGNESIUM OXIDE -MG SUPPLEMENT 400 (240 MG) MG PO TABS
400.0000 mg | ORAL_TABLET | Freq: Every day | ORAL | Status: DC
  Administered 2021-04-03 – 2021-04-05 (×3): 400 mg via ORAL
  Filled 2021-04-02 (×3): qty 1

## 2021-04-02 MED ORDER — ALBUTEROL SULFATE (2.5 MG/3ML) 0.083% IN NEBU
2.5000 mg | INHALATION_SOLUTION | Freq: Four times a day (QID) | RESPIRATORY_TRACT | Status: DC | PRN
  Administered 2021-04-03 – 2021-04-12 (×7): 2.5 mg via RESPIRATORY_TRACT
  Filled 2021-04-02 (×7): qty 3

## 2021-04-02 NOTE — Progress Notes (Signed)
Physical Therapy Session Note  Patient Details  Name: Shane [name] MRN: [id number] Date of Birth: [birth year]  Today's Date: 04/02/2021 PT Individual Time: 1115-1200 PT Individual Time Calculation (min): 45 min   Short Term Goals: Week 1:  PT Short Term Goal 1 (Week 1): Pt will perform supine<>sit mod-I PT Short Term Goal 2 (Week 1): Pt will perform sit<>stands using LRAD with CGA PT Short Term Goal 3 (Week 1): Pt will perform bed<>chair transfers using LRAD with CGA PT Short Term Goal 4 (Week 1): Pt will propel wheelchair at least 170ft with supervision PT Short Term Goal 5 (Week 1): Pt will ambulate at least 62ft using LRAD with CGA  Skilled Therapeutic Interventions/Progress Updates:    Patient received sitting up in bed, agreeable to PT. He denies pain. He was able to come sit edge of bed with supervision and HOB elevated. Lateral scoot transfer to wc with CGA. Patient able to propel himself in wc ~174ft before fatiguing. PT transporting patient remainder of the way to therapy gym. Lateral scoot transfer to therapy mat with CGA. Supine therex as follows: SLR, HSC, quad sets, hip extension, hip abduction 3x10. PT educating patient on pre-prosthetic training including ROM requirements, desensitization and wound healing. Patient returning to wc via lateral scoot transfer and requesting to return to bed. Bed alarm on, call light within reach, NT at bedside.   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 Restrictions Weight Bearing Restrictions: Yes RLE Weight Bearing: Non weight bearing    Therapy/Group: Individual Therapy  Karoline Caldwell, PT, DPT, CBIS  04/02/2021, 7:46 AM

## 2021-04-02 NOTE — Progress Notes (Signed)
Occupational Therapy Session Note  Patient Details  Name: Shane [name] MRN: [id number] Date of Birth: Aug 14, 1941  Today's Date: 04/02/2021 OT Group Time: 1345-1430 OT Group Time Calculation (min): 45 min     Short Term Goals: Week 1:  OT Short Term Goal 1 (Week 1): With use of AE, pt will complete LB dressing with min assist OT Short Term Goal 2 (Week 1): Pt will complete toileting hygiene and clothing management with min assist OT Short Term Goal 3 (Week 1): Pt demonstrate dynamic standing balance with CGA during functional ADL/IADL task.  Skilled Therapeutic Interventions/Progress Updates:    Pt participated in rhythmic drumming group. Pain not reported during session. Focus of group on BUE coordination, strengthening, endurance, timing/control, activity tolerance, and social participation and engagement. Pt performs session from seated position for energy conservation. Skilled interventions included intermittent rest breaks to allow time to catch breath, grading movements up/down and including trunk shifts to engage/strengthen core. Warm up performed prior to exercises and UB stretching completed at end of group with demo from OT. Pt able to select preferred song to share with group. Returned pt to room at end of session. Exited session with pt seated in bed, exit alarm on and call light in reach  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 Restrictions Weight Bearing Restrictions: Yes RLE Weight Bearing: Non weight bearing General:   Vital Signs: Therapy Vitals Temp: 98.8 F (37.1 C) Temp Source: Oral Pulse Rate: (!) 59 Resp: 17 BP: 120/60 Patient Position (if appropriate): Sitting Oxygen Therapy SpO2: 98 % O2 Device: Room Air Pain:   ADL:   Vision   Perception    Praxis   Exercises:   Other Treatments:      Therapy/Group: Group Therapy  Tonny Branch 04/02/2021, 4:50 PM

## 2021-04-02 NOTE — Progress Notes (Signed)
Physical Therapy Weekly Progress Note  Patient Details  Name: Shane [name] MRN: [id number] Date of Birth: 03/31/1941  Beginning of progress report period: March 27, 2021 End of progress report period: April 02, 2021  Today's Date: 04/02/2021 PT Individual Time: 4332-9518 PT Individual Time Calculation (min): 55 min   Patient has met 3 of 5 short term goals.  Patient progressing this week with transfers and w/c mobility able to achieve 's for sit to stand, w/c mobility and progressing with gait, though still limited distance and from mat in gym pt still needing up to min A for supine to sit due to UE weakness.  Patient continues to progress, however and will continue to benefit from skilled PT.   Patient continues to demonstrate the following deficits muscle weakness, decreased balance, decreased activity tolerance and therefore will continue to benefit from skilled PT intervention to increase functional independence with mobility.  Patient progressing toward long term goals..  Continue plan of care.  PT Short Term Goals Week 1:  PT Short Term Goal 1 (Week 1): Pt will perform supine<>sit mod-I PT Short Term Goal 1 - Progress (Week 1): Progressing toward goal PT Short Term Goal 2 (Week 1): Pt will perform sit<>stands using LRAD with CGA PT Short Term Goal 2 - Progress (Week 1): Met PT Short Term Goal 3 (Week 1): Pt will perform bed<>chair transfers using LRAD with CGA PT Short Term Goal 3 - Progress (Week 1): Met PT Short Term Goal 4 (Week 1): Pt will propel wheelchair at least 124ft with supervision PT Short Term Goal 4 - Progress (Week 1): Met PT Short Term Goal 5 (Week 1): Pt will ambulate at least 45ft using LRAD with CGA PT Short Term Goal 5 - Progress (Week 1): Progressing toward goal Week 2:  PT Short Term Goal 1 (Week 2): Patient will perform sit to stand with S PT Short Term Goal 2 (Week 2): Patient will demonstrate standing balance with RW and 1 UE support with S for  functional reach PT Short Term Goal 3 (Week 2): Patient will ambulate 10' with LRAD and min A. PT Short Term Goal 4 (Week 2): Patient will perform squat pivot bed<>w/c with S.  Skilled Therapeutic Interventions/Progress Updates:  Patient in supine reports wife here this am to assist with helping him dress.  Patient supine to sit with elevated HOB with S.  Performed squat pivot to w/c with CGA.  Patient propelled w/c to ortho gym 120' with S using w/c gloves.  Performed UBE x 2' fwd and 2' reverse without resistance.  In therapy gym in parallel bars sit to stand with CGA and ambulated 4 x 5' with min A.  Patient at times with limited L foot clearance, but improving over previous sessions.  Sit to stand to RW with CGA to close S x 5 reps with cues for balance.  Patient assisted in w/c to room and performed pivot to bed with bedrail with S. Left with all needs in reach and bed alarm active.   Ambulation/gait training;Community reintegration;DME/adaptive equipment instruction;Neuromuscular re-education;Psychosocial support;UE/LE Strength taining/ROM;Wheelchair propulsion/positioning;Balance/vestibular training;Discharge planning;Functional electrical stimulation;Pain management;Skin care/wound management;Therapeutic Activities;UE/LE Coordination activities;Cognitive remediation/compensation;Disease management/prevention;Functional mobility training;Patient/family education;Splinting/orthotics;Therapeutic Exercise;Visual/perceptual remediation/compensation   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 Restrictions Weight Bearing Restrictions: Yes RLE Weight Bearing: Non weight bearing Pain: Pain Assessment Pain Score: 0-No pain   Therapy/Group: Individual Therapy  Reginia Naas  Magda Kiel, PT 04/02/2021, 10:07 AM

## 2021-04-02 NOTE — Progress Notes (Signed)
PROGRESS NOTE   Subjective/Complaints: No complaints this morning Denies pain.   ROS: + Shortness of breath (baseline).  Denies CP, N/V/D  Objective:   No results found. No results for input(s): WBC, HGB, HCT, PLT in the last 72 hours. No results for input(s): NA, K, CL, CO2, GLUCOSE, BUN, CREATININE, CALCIUM in the last 72 hours.  Intake/Output Summary (Last 24 hours) at 04/02/2021 1220 Last data filed at 04/02/2021 1045 Gross per 24 hour  Intake 714 ml  Output 1200 ml  Net -486 ml     Pressure Injury 03/26/21 Toe (Comment  which one) Anterior;Left Unstageable - Full thickness tissue loss in which the base of the injury is covered by slough (yellow, tan, gray, green or brown) and/or eschar (tan, brown or black) in the wound bed. pressu (Active)  03/26/21 1423  Location: Toe (Comment  which one)  Location Orientation: Anterior;Left  Staging: Unstageable - Full thickness tissue loss in which the base of the injury is covered by slough (yellow, tan, gray, green or brown) and/or eschar (tan, brown or black) in the wound bed.  Wound Description (Comments): pressure injury on medial aspect of great toe knuckle  Present on Admission: Yes    Physical Exam: Vital Signs Blood pressure 129/61, pulse (!) 59, temperature 97.8 F (36.6 C), resp. rate 17, height 6' (1.829 m), weight 103.7 kg, SpO2 99 %. Gen: no distress, normal appearing HEENT: oral mucosa pink and moist, NCAT Cardio: bradycardic Chest: normal effort, normal rate of breathing Abd: soft, non-distended Ext: no edema Psych: pleasant, normal affect Skin: intact Right BKA dressing CDI Left toe dressing CDI Psych: Normal mood.  Normal behavior. Musc: Left BKA with edema and tenderness, improving Neuro: Alert Motor: 5/5 throughout, grossly unchanged  Assessment/Plan: 1. Functional deficits which require 3+ hours per day of interdisciplinary therapy in a  comprehensive inpatient rehab setting.  Physiatrist is providing close team supervision and 24 hour management of active medical problems listed below.  Physiatrist and rehab team continue to assess barriers to discharge/monitor patient progress toward functional and medical goals  Care Tool:  Bathing    Body parts bathed by patient: Right arm,Left arm,Chest,Abdomen,Front perineal area,Right upper leg,Left upper leg,Face,Left lower leg   Body parts bathed by helper: Buttocks Body parts n/a: Right lower leg   Bathing assist Assist Level: Minimal Assistance - Patient > 75%     Upper Body Dressing/Undressing Upper body dressing   What is the patient wearing?: Pull over shirt    Upper body assist Assist Level: Set up assist    Lower Body Dressing/Undressing Lower body dressing      What is the patient wearing?: Underwear/pull up,Pants     Lower body assist Assist for lower body dressing: Minimal Assistance - Patient > 75%     Toileting Toileting Toileting Activity did not occur (Clothing management and hygiene only): N/A (no void or bm)  Toileting assist Assist for toileting: Minimal Assistance - Patient > 75%     Transfers Chair/bed transfer  Transfers assist     Chair/bed transfer assist level: Contact Guard/Touching assist (lateral scoot)     Locomotion Ambulation   Ambulation assist  Assist level: Minimal Assistance - Patient > 75% Assistive device: Parallel bars Max distance: 5'   Walk 10 feet activity   Assist  Walk 10 feet activity did not occur: Safety/medical concerns        Walk 50 feet activity   Assist Walk 50 feet with 2 turns activity did not occur: Safety/medical concerns         Walk 150 feet activity   Assist Walk 150 feet activity did not occur: Safety/medical concerns         Walk 10 feet on uneven surface  activity   Assist Walk 10 feet on uneven surfaces activity did not occur: Safety/medical  concerns         Wheelchair     Assist Will patient use wheelchair at discharge?: Yes Type of Wheelchair: Manual    Wheelchair assist level: Supervision/Verbal cueing Max wheelchair distance: 110'    Wheelchair 50 feet with 2 turns activity    Assist        Assist Level: Supervision/Verbal cueing   Wheelchair 150 feet activity     Assist  Wheelchair 150 feet activity did not occur:  (SOB)   Assist Level: Moderate Assistance - Patient 50 - 74% (pt propelled 100 ft)    Medical Problem List and Plan: 1.  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  Wound VAC removed 03/24/2021.  Continue CIR 2.  Antithrombotics: -DVT/anticoagulation: Eliquis             -antiplatelet therapy: N/A 3. Pain Management: Neurontin 300 mg 3 times daily             Controlled with meds on 4/29, decrease tramadol to q8H prn  Monitor with increased exertion, particularly for phantom limb pain 4. Anxiety: Continue Lexapro 10 mg daily, Ativan 1 mg nightly             -antipsychotic agents: N/A 5. Neuropsych: This patient is capable of making decisions on his own behalf. 6. Skin/Wound Care: continue shrinker to stump  -Santyl ordered to left great toe -surgery to evaluate, continue to await 7. Fluids/Electrolytes/Nutrition: Routine in and outs 8.  Acute blood loss anemia.  Continue iron supplement.    Hemoglobin 8.8 on 4/25  Continue to monitor 9.  Hypotensive: d/c metoprolol.  10.  Diabetes mellitus with hyperglycemia.  Latest hemoglobin A1c 7.8.    NovoLog 4 units 3 times daily with meals, will likely need to increase  Lantus insulin 12 units nightly decreased to 8 on 4/27.    Check blood sugars before meals and at bedtime  Remain slightly labile, will not consider medication adjustments today, given recent changes 11.  GERD.  Protonix 12.  Dyslipidemia: Lipitor 13.  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  Pacemaker removed 12/14/2020 due  to staph bacteremia.  Follow-up cardiology services 14.  Idiopathic pulmonary fibrosis.  Continue CPAP 15.  Atrial fibrillation.  Cardiac rate controlled, regular.  Continue Eliquis.             Monitor increase activity 16.  CAD with CABG.  No chest pain or increasing shortness of breath 17.  CKD stage II/III.    Creatinine 1.17 on 4/25  Continue to monitor 18.  Hyponatremia  Sodium 131 on 4/25  Continue to monitor 19. Pulmonary fibrosis   Last infusion on 4/26  Albuterol scheduled (?  Home medication), discontinued by respiratory, reordered   LOS: 7 days A FACE TO FACE EVALUATION WAS PERFORMED  Martha Clan P Rube Sanchez 04/02/2021, 12:20 PM

## 2021-04-02 NOTE — Consult Note (Signed)
Neuropsychological Consultation   Patient:   Shane [name]   DOB:   August 30, 1941  MR Number:  [id number]  Location:  [facility] [address] CENTER [address] [place] [id number] [place] [id number] Dept: [place]: [phone]           Date of Service:   04/02/2021  Start Time:   8 AM End Time:   9 AM  Provider/Observer:  Ilean Skill, Psy.D.       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 fib on Eliquis, 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  Patient presented on 03/17/2021 with gangrenous right foot, ischemic changes over the heel first metatarsal head and lateral incision with noted right fifth ray amputation that was done on 01/22/2021 as well as right tibial calcaneal fusion.  Due to progressive gangrenous changes limb was not felt to be salvageable and patient had right transtibial amputation on 03/17/2021 by Dr. Sharol Given.  Wound VAC removed on 03/24/2021 and therapy evaluations were completed with the patient being referred to CIR due to decreased functional mobility.  Reason for Service:  Patient was referred for neuropsychological consultation due to coping and adjustment with recent right below the knee amputation and extended rehabilitation stay.  Below see HPI for the current admission.  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  History taken from chart review and patient.  Patient lives with spouse.  Two-level home bed and bath main level.  Ramped entrance.  Independent with assistive device.  Wife does assist with some ADLs.  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 Sharol Given with wound VAC applied.  Hospital was complicated by acute blood loss anemia.  Postoperative anemia 6.0.  He was transfused 2 units packed red blood cells with latest hemoglobin 8.0.  His renal function remained stable with latest creatinine 1.12.  Patient did have some orthostasis requiring norepinephrine.  His chronic Eliquis remained on hold after BKA until 03/24/2021 and resumed.  Echocardiogram on 03/20/2019 showed ejection fraction of 50-55%, no wall motion abnormalities.  Wound VAC was removed 03/24/2021.  Therapy evaluations completed due to patient decreased functional mobility was admitted for a comprehensive rehab program.  Please see preadmission assessment from earlier today as well.  Current Status:  Patient was awake and alert and well oriented with good cognition.  Initially, his wife was there but she left the room early on.  Patient appeared to have positive affect and fully aware of all that had transpired.  Patient reports that he had prepared for the likelihood of having an amputation and there have been multiple surgeries prior to try to salvage his  limb.  The patient reports that he feels like it is healing quite well and denies any significant mood disturbance.  He reports that he is coping with the loss of his lower right leg and is looking forward to prostatic availability.  Patient reports that  he has not been told a lot at this point as to when he would be available for prosthetic fitting.  Patient reports that his father had a prosthetic leg and managed quite well with it even before the advances have been made with current prosthetics.  Patient does have a past history of anxiety symptoms but denies any exacerbation of his anxiety and in fact he realizes that his medical status is much more stable and safe now that the limb has been removed due to significant reduction in risk of developing sepsis or other infection.  Behavioral Observation: Shane [name]  presents as a 80 y.o.-year-old Right Caucasian Male who appeared his stated age. his dress was Appropriate and he was Well Groomed and his manners were Appropriate to the situation.  his participation was indicative of Appropriate and Attentive behaviors.  There were physical disabilities noted.  he displayed an appropriate level of cooperation and motivation.     Interactions:    Active Appropriate and Attentive  Attention:   within normal limits and attention span and concentration were age appropriate  Memory:   within normal limits; recent and remote memory intact  Visuo-spatial:  not examined  Speech (Volume):  normal  Speech:   normal; normal  Thought Process:  Coherent and Relevant  Though Content:  WNL; not suicidal and not homicidal  Orientation:   person, place, time/date and situation  Judgment:   Good  Planning:   Good  Affect:    Appropriate  Mood:    Euphoric  Insight:   Good  Intelligence:   normal  Medical History:   Past Medical History:  Diagnosis Date  . Anxiety   . 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 Cataract   . Chronic kidney disease    d/t DM  . CKD (chronic kidney disease), stage III ([place])   . Depression   . Diabetes mellitus    Vgo disposal insulin bolus  simular to insulin pump  . Dyspnea   . GERD (gastroesophageal reflux disease)   . History of kidney stones    passed  .  Hyperlipidemia   . Hypertension   . Idiopathic pulmonary fibrosis ([place]) 11/2016  . ILD (interstitial lung disease) ([place])   . Moderate aortic stenosis    a. 10/2019 Echo: EF 55-60%, Gr2 DD. Nl RV.   Marland Kitchen Neuromuscular disorder ([place])   . Neuropathy associated with endocrine disorder ([place])   . Nonobstructive CAD (coronary artery disease)    a. 2012 Cath: mod, nonobs dzs; b. 10/2016 MV: EF 60%, no ischemia.  . OSA on CPAP 05/05/2017   Unattended Home Sleep Test 7/2/813-AHI 38.6/hour, desaturation to 64%, body weight 261 pounds  . PONV (postoperative nausea and vomiting)   . Sleep apnea     uses cpap asked to bring mask and tubing         Patient Active Problem List   Diagnosis Date Noted  . Labile blood glucose   . Pulmonary fibrosis ([place])   . Hyponatremia   . 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[place]) 03/26/2021  . S/P BKA (below knee amputation) unilateral, right ([place])   . Shortness of breath   . Acute blood loss anemia   . Stage 3  chronic kidney disease ([place])   . Atrial fibrillation ([place])   . Controlled type 2 diabetes mellitus with hyperglycemia, with long-term current use of insulin ([place])   . Postoperative pain   . Postoperative hemorrhagic shock 03/20/2021  . Atherosclerosis of native arteries of extremities with gangrene, right leg ([place])   . Hardware complicating wound infection ([place])   . Abscess of bursa of right ankle 02/15/2021  . 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 Peripheral vascular disease ([place]) 12/29/2020  . CHF (congestive heart failure), NYHA class II, chronic, diastolic ([place]) 81/12/7508  . History of COVID-19 12/22/2020  . SSS (sick sinus syndrome) ([place]) 12/22/2020  . Acute bacterial endocarditis   . MSSA bacteremia 12/09/2020  . Diabetic foot ulcer ([place]) 12/09/2020  . Foot drop, right foot   . Generalized weakness 12/08/2020  . Dehydration with hyponatremia 12/08/2020  . Atrial fibrillation, chronic ([place]) 12/08/2020  . Elevated troponin level not due myocardial infarction  12/08/2020  . Adrenal insufficiency ([place]) 11/14/2020  . Orthostatic hypotension 11/14/2020  . Physical deconditioning 11/14/2020  . Difficulty sleeping 11/07/2020  . Postoperative anemia due to acute blood loss 11/07/2020  . Right ventricular dysfunction 11/05/2020  . 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 Hearing loss 11/03/2020  . Cardiac device in situ 09/09/2020  . Coronary atherosclerosis due to lipid rich plaque 09/02/2020  . COVID-19 virus infection 07/18/2020  . Coronary artery disease involving native heart without angina pectoris 07/10/2020  . Chronic kidney disease, stage 3a ([place]) 03/29/2020  . 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 ([place]) 03/16/2020  . Right epiretinal membrane 03/16/2020  . Posterior vitreous detachment of right eye 03/16/2020  . Aortic stenosis, moderate 03/12/2020  . 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 Near syncope 04/27/2018  . Hypoglycemia due to insulin 01/06/2018  . Essential hypertension 01/06/2018  . Diabetes mellitus type 2, with complication, on long term insulin pump ([place]) 01/06/2018  . Syncope and collapse 01/05/2018  . Encounter for therapeutic drug monitoring 06/29/2017  . OSA on CPAP 05/05/2017  . 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[place]) 01/05/2017  . Abnormal chest x-ray 10/12/2016  . Benign neoplasm of colon 01/14/2013    Psychiatric History:  Patient with prior history of anxiety with no acute exacerbation.  Patient maintained on Lexapro that was outside medication as well.  Family Med/Psych History:  Family History  Problem Relation Age of Onset  . Diabetes Mellitus II Mother   . Emphysema Father 56  . Heart attack Father   . Colon cancer Neg Hx   . Esophageal cancer Neg Hx   . Rectal cancer Neg Hx   . 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 fib on Eliquis, 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  Patient presented on 03/17/2021 with gangrenous right foot, ischemic changes over the heel first metatarsal head and lateral incision with noted right fifth ray amputation that was done on 01/22/2021 as well as right tibial calcaneal fusion.  Due to progressive gangrenous changes limb was not felt to be salvageable and patient had right transtibial amputation on  03/17/2021 by Dr. Sharol Given.  Wound VAC removed on 03/24/2021 and therapy evaluations were completed with the patient being referred to CIR due to decreased functional mobility.  Patient was awake and alert and well oriented with good cognition.  Initially, his wife was there but she left the room early on.  Patient appeared to have positive affect and fully aware of all that had transpired.  Patient reports that he had prepared for the likelihood of having an amputation and there have been multiple surgeries prior to try to salvage his limb.  The patient reports that he feels like it is healing quite well and denies any significant mood disturbance.  He reports that he is coping with the loss of his lower right leg and is looking forward to prostatic availability.  Patient reports that he has not been told a lot at this point as to when he would be available for prosthetic fitting.  Patient reports that his father had a prosthetic leg and managed quite well with it even before the advances have been made with current prosthetics.  Patient does have a past history of anxiety symptoms but denies any exacerbation of his anxiety and in fact he realizes that his medical status is much more  stable and safe now that the limb has been removed due to significant reduction in risk of developing sepsis or other infection.  Disposition/Plan:  Worked on coping and adjustment issues around recent right BKA.  Diagnosis:    Right BKA        Electronically Signed   _______________________ Ilean Skill, Psy.D. Clinical Neuropsychologist

## 2021-04-02 NOTE — Progress Notes (Signed)
Patient ID: Shane [name], male   DOB: 10-21-1941, 80 y.o.   MRN: [id number] Spoke with wife to let know left prescription for commode and tub equipment in envelope on window sill in pt;s room. He is aware also. She will get when she is here later. Aware pt moving to [facility] later today

## 2021-04-02 NOTE — Progress Notes (Signed)
Occupational Therapy Session Note  Patient Details  Name: Shane [name] MRN: [id number] Date of Birth: 1941/04/02  Today's Date: 04/02/2021 OT Individual Time: 1500-1527 OT Individual Time Calculation (min): 27 min    Short Term Goals: Week 1:  OT Short Term Goal 1 (Week 1): With use of AE, pt will complete LB dressing with min assist OT Short Term Goal 2 (Week 1): Pt will complete toileting hygiene and clothing management with min assist OT Short Term Goal 3 (Week 1): Pt demonstrate dynamic standing balance with CGA during functional ADL/IADL task.  Skilled Therapeutic Interventions/Progress Updates:    Pt greeted seated semi-reclined in bed and agreeable OT treatment session. Pt completed bed mobility with supervision. OT assist to don L Darco shoe and remove R stump protector. LB there-ex 3 sets of 10 seated hip extension and knee extension on B LE's. Pt reported his wheelchair was not brought up with him to new room, so OT retrieved wc with R amputee pad. Pt returned to bed and left semi-reclined in bed with bed alarm on, call bell in reach, and needs met.   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 Restrictions Weight Bearing Restrictions: Yes RLE Weight Bearing: Non weight bearing Pain:  no pain  Therapy/Group: Individual Therapy  Valma Cava 04/02/2021, 3:00 PM

## 2021-04-03 LAB — GLUCOSE, CAPILLARY
Glucose-Capillary: 101 mg/dL — ABNORMAL HIGH (ref 70–99)
Glucose-Capillary: 134 mg/dL — ABNORMAL HIGH (ref 70–99)
Glucose-Capillary: 128 mg/dL — ABNORMAL HIGH (ref 70–99)
Glucose-Capillary: 166 mg/dL — ABNORMAL HIGH (ref 70–99)

## 2021-04-03 NOTE — Progress Notes (Signed)
Physical Therapy Session Note  Patient Details  Name: Shane [name] MRN: [id number] Date of Birth: 28-Oct-1941  Today's Date: 04/03/2021 PT Individual Time: 0800-0855 PT Individual Time Calculation (min): 55 min   Short Term Goals: Week 2:  PT Short Term Goal 1 (Week 2): Patient will perform sit to stand with S PT Short Term Goal 2 (Week 2): Patient will demonstrate standing balance with RW and 1 UE support with S for functional reach PT Short Term Goal 3 (Week 2): Patient will ambulate 10' with LRAD and min A. PT Short Term Goal 4 (Week 2): Patient will perform squat pivot bed<>w/c with S.  Skilled Therapeutic Interventions/Progress Updates:    Patient received sitting up in bed, agreeable to PT. He denies pain. Patient able to come sit edge of bed with supervision and HOB elevated. Doffing dirty shirt independently and donning clean shirt with set up assist. Patient able to thread B LE into pants with supervision and verbal cues. Sit > stand with RW and elevated bed height with MinA to finish pulling up pants. MinA stand pivot to wc with RW. Patient washing his face seated with set up assist. He was able to propel himself x165ft with B UE to 4W therapy gym. 34mins fwd/2 mins backward on UE ergometer completed x3 for improved CV endurance as well as UE strength. Patient transferring to therapy mat via lateral scoot with CGA. Scap depression/tricep pushups 3x10 completed to assist with sit <> stand + off weighting L LE during gait. Patient transferring back to wc via lateral scoot with CGA. PT transporting patient back to [facility]- transferring to bed. Bed alarm on, call light within reach.   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

## 2021-04-03 NOTE — Progress Notes (Signed)
PROGRESS NOTE   Subjective/Complaints: No complaints this morning  ROS: + Shortness of breath (baseline).  Denies CP, N/V/D  Objective:   No results found. No results for input(s): WBC, HGB, HCT, PLT in the last 72 hours. No results for input(s): NA, K, CL, CO2, GLUCOSE, BUN, CREATININE, CALCIUM in the last 72 hours.  Intake/Output Summary (Last 24 hours) at 04/03/2021 1443 Last data filed at 04/03/2021 1311 Gross per 24 hour  Intake 837 ml  Output 1700 ml  Net -863 ml     Pressure Injury 03/26/21 Toe (Comment  which one) Anterior;Left Unstageable - Full thickness tissue loss in which the base of the injury is covered by slough (yellow, tan, gray, green or brown) and/or eschar (tan, brown or black) in the wound bed. pressu (Active)  03/26/21 1423  Location: Toe (Comment  which one)  Location Orientation: Anterior;Left  Staging: Unstageable - Full thickness tissue loss in which the base of the injury is covered by slough (yellow, tan, gray, green or brown) and/or eschar (tan, brown or black) in the wound bed.  Wound Description (Comments): pressure injury on medial aspect of great toe knuckle  Present on Admission: Yes    Physical Exam: Vital Signs Blood pressure 129/69, pulse (!) 59, temperature (!) 97.5 F (36.4 C), temperature source Oral, resp. rate 14, height 6' (1.829 m), weight 103.7 kg, SpO2 99 %. Gen: no distress, normal appearing HEENT: oral mucosa pink and moist, NCAT Cardio: Reg rate Chest: normal effort, normal rate of breathing Abd: soft, non-distended Ext: no edema Psych: pleasant, normal affect Skin: Right BKA dressing CDI Left toe dressing CDI Psych: Normal mood.  Normal behavior. Musc: Left BKA with edema and tenderness, improving Neuro: Alert Motor: 5/5 throughout, grossly unchanged  Assessment/Plan: 1. Functional deficits which require 3+ hours per day of interdisciplinary therapy in a  comprehensive inpatient rehab setting.  Physiatrist is providing close team supervision and 24 hour management of active medical problems listed below.  Physiatrist and rehab team continue to assess barriers to discharge/monitor patient progress toward functional and medical goals  Care Tool:  Bathing    Body parts bathed by patient: Right arm,Left arm,Chest,Abdomen,Front perineal area,Right upper leg,Left upper leg,Face,Left lower leg   Body parts bathed by helper: Buttocks Body parts n/a: Right lower leg   Bathing assist Assist Level: Minimal Assistance - Patient > 75%     Upper Body Dressing/Undressing Upper body dressing   What is the patient wearing?: Pull over shirt    Upper body assist Assist Level: Set up assist    Lower Body Dressing/Undressing Lower body dressing      What is the patient wearing?: Underwear/pull up,Pants     Lower body assist Assist for lower body dressing: Minimal Assistance - Patient > 75%     Toileting Toileting Toileting Activity did not occur (Clothing management and hygiene only): N/A (no void or bm)  Toileting assist Assist for toileting: Minimal Assistance - Patient > 75%     Transfers Chair/bed transfer  Transfers assist     Chair/bed transfer assist level: Contact Guard/Touching assist     Locomotion Ambulation   Ambulation assist  Assist level: Minimal Assistance - Patient > 75% Assistive device: Parallel bars Max distance: 5   Walk 10 feet activity   Assist  Walk 10 feet activity did not occur: Safety/medical concerns        Walk 50 feet activity   Assist Walk 50 feet with 2 turns activity did not occur: Safety/medical concerns         Walk 150 feet activity   Assist Walk 150 feet activity did not occur: Safety/medical concerns         Walk 10 feet on uneven surface  activity   Assist Walk 10 feet on uneven surfaces activity did not occur: Safety/medical concerns          Wheelchair     Assist Will patient use wheelchair at discharge?: Yes Type of Wheelchair: Manual    Wheelchair assist level: Supervision/Verbal cueing Max wheelchair distance: 120'    Wheelchair 50 feet with 2 turns activity    Assist        Assist Level: Supervision/Verbal cueing   Wheelchair 150 feet activity     Assist  Wheelchair 150 feet activity did not occur:  (SOB)   Assist Level: Contact Guard/Touching assist,Minimal Assistance - Patient > 75%    Medical Problem List and Plan: 1.  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  Wound VAC removed 03/24/2021.  Continue CIR 2.  Antithrombotics: -DVT/anticoagulation: Eliquis             -antiplatelet therapy: N/A 3. Pain Management: Neurontin 300 mg 3 times daily             Controlled with meds on 4/30, decrease tramadol to q8H prn  Monitor with increased exertion, particularly for phantom limb pain 4. Anxiety: Continue Lexapro 10 mg daily, Ativan 1 mg nightly             -antipsychotic agents: N/A 5. Neuropsych: This patient is capable of making decisions on his own behalf. 6. Skin/Wound Care: continue shrinker to stump  -Santyl ordered to left great toe -surgery to evaluate, continue to await 7. Fluids/Electrolytes/Nutrition: Routine in and outs 8.  Acute blood loss anemia.  Continue iron supplement.    Hemoglobin 8.8 on 4/25  Continue to monitor 9.  Hypotensive: d/c metoprolol.  10.  Diabetes mellitus with hyperglycemia.  Latest hemoglobin A1c 7.8.  NovoLog 4 units 3 times daily with meals, will likely need to increase  Lantus insulin 12 units nightly decreased to 8 on 4/27.    Check blood sugars before meals and at bedtime  Remain slightly labile, will not consider medication adjustments today, given recent changes 11.  GERD.  Continue Protonix 12.  Dyslipidemia: Continue Lipitor 13.  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  Pacemaker removed 12/14/2020 due  to staph bacteremia.  Follow-up cardiology services 14.  Idiopathic pulmonary fibrosis.  Continue CPAP 15.  Atrial fibrillation.  Cardiac rate controlled, regular.  Continue Eliquis.             Monitor increase activity 16.  CAD with CABG.  No chest pain or increasing shortness of breath 17.  CKD stage II/III.    Creatinine 1.17 on 4/25  Continue to monitor 18.  Hyponatremia  Sodium 131 on 4/25  Continue to monitor 19. Pulmonary fibrosis   Last infusion on 4/26  Albuterol scheduled (?  Home medication), discontinued by respiratory, reordered   LOS: 8 days A FACE TO FACE EVALUATION WAS PERFORMED  Shane [name] P Nekoda Chock  04/03/2021, 2:43 PM

## 2021-04-04 LAB — GLUCOSE, CAPILLARY
Glucose-Capillary: 117 mg/dL — ABNORMAL HIGH (ref 70–99)
Glucose-Capillary: 188 mg/dL — ABNORMAL HIGH (ref 70–99)
Glucose-Capillary: 143 mg/dL — ABNORMAL HIGH (ref 70–99)
Glucose-Capillary: 150 mg/dL — ABNORMAL HIGH (ref 70–99)

## 2021-04-05 ENCOUNTER — Other Ambulatory Visit: Payer: Self-pay | Admitting: Internal Medicine

## 2021-04-05 LAB — GLUCOSE, CAPILLARY
Glucose-Capillary: 194 mg/dL — ABNORMAL HIGH (ref 70–99)
Glucose-Capillary: 124 mg/dL — ABNORMAL HIGH (ref 70–99)
Glucose-Capillary: 210 mg/dL — ABNORMAL HIGH (ref 70–99)
Glucose-Capillary: 243 mg/dL — ABNORMAL HIGH (ref 70–99)

## 2021-04-05 MED ORDER — MAGNESIUM OXIDE -MG SUPPLEMENT 400 (240 MG) MG PO TABS
400.0000 mg | ORAL_TABLET | Freq: Every day | ORAL | Status: DC
  Administered 2021-04-05 – 2021-04-13 (×9): 400 mg via ORAL
  Filled 2021-04-05 (×8): qty 1

## 2021-04-05 NOTE — Progress Notes (Signed)
Occupational Therapy Weekly Progress Note  Patient Details  Name: Shane [name] MRN: [id number] Date of Birth: 08/30/1941  Beginning of progress report period: March 27, 2021 End of progress report period: Apr 05, 2021     Patient has met 2 of 3 short term goals.  Patient making gains in all areas of mobility and self care, continues to require mod A for CM in stance.   Patient continues to demonstrate the following deficits: muscle weakness, decreased cardiorespiratoy endurance and decreased sitting balance, decreased standing balance and decreased postural control and therefore will continue to benefit from skilled OT intervention to enhance overall performance with BADL and Reduce care partner burden.  Patient progressing toward long term goals..  Continue plan of care.  OT Short Term Goals Week 1:  OT Short Term Goal 1 (Week 1): With use of AE, pt will complete LB dressing with min assist OT Short Term Goal 1 - Progress (Week 1): Met OT Short Term Goal 2 (Week 1): Pt will complete toileting hygiene and clothing management with min assist OT Short Term Goal 2 - Progress (Week 1): Progressing toward goal OT Short Term Goal 3 (Week 1): Pt demonstrate dynamic standing balance with CGA during functional ADL/IADL task. OT Short Term Goal 3 - Progress (Week 1): Met Week 2:  OT Short Term Goal 1 (Week 2): Patient will complete SPT with RW CS level with RW and appropriate DME OT Short Term Goal 2 (Week 2): patient will complete toileting with CS OT Short Term Goal 3 (Week 2): patient will complete bathing with set up/CS   [name] [name] 04/05/2021, 11:43 AM

## 2021-04-05 NOTE — Progress Notes (Signed)
PROGRESS NOTE   Subjective/Complaints: Patient seen sitting up working with therapy this morning.  He states he slept well overnight.  Per therapies, patient with increased bleeding to stop.  Evaluated and discussed with patient and therapies.  He notes improvement in breathing with breathing treatments.  ROS: + Shortness of breath (baseline).  Denies CP, N/V/D  Objective:   No results found. No results for input(s): WBC, HGB, HCT, PLT in the last 72 hours. No results for input(s): NA, K, CL, CO2, GLUCOSE, BUN, CREATININE, CALCIUM in the last 72 hours.  Intake/Output Summary (Last 24 hours) at 04/05/2021 1552 Last data filed at 04/04/2021 2049 Gross per 24 hour  Intake 60 ml  Output 200 ml  Net -140 ml     Pressure Injury 03/26/21 Toe (Comment  which one) Anterior;Left Unstageable - Full thickness tissue loss in which the base of the injury is covered by slough (yellow, tan, gray, green or brown) and/or eschar (tan, brown or black) in the wound bed. pressu (Active)  03/26/21 1423  Location: Toe (Comment  which one)  Location Orientation: Anterior;Left  Staging: Unstageable - Full thickness tissue loss in which the base of the injury is covered by slough (yellow, tan, gray, green or brown) and/or eschar (tan, brown or black) in the wound bed.  Wound Description (Comments): pressure injury on medial aspect of great toe knuckle  Present on Admission: Yes    Physical Exam: Vital Signs Blood pressure (!) 143/67, pulse 62, temperature 98 F (36.7 C), temperature source Oral, resp. rate 16, height 6' (1.829 m), weight 103.7 kg, SpO2 99 %. Constitutional: No distress . Vital signs reviewed.  Morbidly obese. HENT: Normocephalic.  Atraumatic. Eyes: EOMI. No discharge. Cardiovascular: No JVD.  RRR. Respiratory: Normal effort.  No stridor.  Bilateral clear to auscultation. GI: Non-distended.  BS +. Skin: Warm and dry.   Right BKA with  staples and mild serosanguineous drainage along medial staple lines. Left toe with dressing CDI Psych: Normal mood.  Normal behavior. Musc: Left BKA with improving edema and tenderness  Assessment/Plan: 1. Functional deficits which require 3+ hours per day of interdisciplinary therapy in a comprehensive inpatient rehab setting.  Physiatrist is providing close team supervision and 24 hour management of active medical problems listed below.  Physiatrist and rehab team continue to assess barriers to discharge/monitor patient progress toward functional and medical goals  Care Tool:  Bathing    Body parts bathed by patient: Right arm,Left arm,Chest,Abdomen,Front perineal area,Right upper leg,Left upper leg,Face,Left lower leg   Body parts bathed by helper: Buttocks Body parts n/a: Right lower leg   Bathing assist Assist Level: Minimal Assistance - Patient > 75%     Upper Body Dressing/Undressing Upper body dressing   What is the patient wearing?: Pull over shirt    Upper body assist Assist Level: Set up assist    Lower Body Dressing/Undressing Lower body dressing      What is the patient wearing?: Underwear/pull up,Pants     Lower body assist Assist for lower body dressing: Minimal Assistance - Patient > 75%     Toileting Toileting Toileting Activity did not occur (Clothing management and hygiene only): N/A (no  void or bm)  Toileting assist Assist for toileting: Minimal Assistance - Patient > 75%     Transfers Chair/bed transfer  Transfers assist     Chair/bed transfer assist level: Contact Guard/Touching assist     Locomotion Ambulation   Ambulation assist      Assist level: Minimal Assistance - Patient > 75% Assistive device: Parallel bars Max distance: 5   Walk 10 feet activity   Assist  Walk 10 feet activity did not occur: Safety/medical concerns        Walk 50 feet activity   Assist Walk 50 feet with 2 turns activity did not occur:  Safety/medical concerns         Walk 150 feet activity   Assist Walk 150 feet activity did not occur: Safety/medical concerns         Walk 10 feet on uneven surface  activity   Assist Walk 10 feet on uneven surfaces activity did not occur: Safety/medical concerns         Wheelchair     Assist Will patient use wheelchair at discharge?: Yes Type of Wheelchair: Manual    Wheelchair assist level: Supervision/Verbal cueing Max wheelchair distance: 120'    Wheelchair 50 feet with 2 turns activity    Assist        Assist Level: Supervision/Verbal cueing   Wheelchair 150 feet activity     Assist  Wheelchair 150 feet activity did not occur:  (SOB)   Assist Level: Contact Guard/Touching assist,Minimal Assistance - Patient > 75%    Medical Problem List and Plan: 1.  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  Wound VAC removed 03/24/2021.  Continue CIR 2.  Antithrombotics: -DVT/anticoagulation: Eliquis             -antiplatelet therapy: N/A 3. Pain Management: Neurontin 300 mg 3 times daily             Controlled with meds on 5/2 m  Monitor with increased exertion, particularly for phantom limb pain 4. Anxiety: Continue Lexapro 10 mg daily, Ativan 1 mg nightly             -antipsychotic agents: N/A 5. Neuropsych: This patient is capable of making decisions on his own behalf. 6. Skin/Wound Care: continue shrinker to stump  -Santyl ordered to left great toe -surgery to evaluate 7. Fluids/Electrolytes/Nutrition: Routine in and outs 8.  Acute blood loss anemia.  Continue iron supplement.    Hemoglobin 8.8 on 4/25, labs ordered for tomorrow  Continue to monitor 9.  Hypotensive: d/c metoprolol.  10.  Diabetes mellitus with hyperglycemia.  Latest hemoglobin A1c 7.8.  NovoLog 4 units 3 times daily with meals, will likely need to increase  Lantus insulin 12 units nightly decreased to 8 on 4/27.    Check blood sugars  before meals and at bedtime  Remain slightly labile on 5/2 11.  GERD.  Continue Protonix 12.  Dyslipidemia: Continue Lipitor 13.  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  Pacemaker removed 12/14/2020 due to staph bacteremia.  Follow-up cardiology services 14.  Idiopathic pulmonary fibrosis.  Continue CPAP 15.  Atrial fibrillation.  Cardiac rate controlled, regular.  Continue Eliquis.             Monitor increase activity 16.  CAD with CABG.  No chest pain or increasing shortness of breath 17.  CKD stage II/III.    Creatinine 1.17 on 4/25, labs are for tomorrow  Continue to monitor 18.  Hyponatremia  Sodium 131 on 4/25, labs ordered for tomorrow  Continue to monitor 19. Pulmonary fibrosis   Last infusion on 4/26  Albuterol scheduled (?  Home medication), discontinued by respiratory, reordered   LOS: 10 days A FACE TO FACE EVALUATION WAS PERFORMED  Alydia Gosser Lorie Phenix 04/05/2021, 3:52 PM

## 2021-04-05 NOTE — Progress Notes (Signed)
Occupational Therapy Session Note  Patient Details  Name: Shane [name] MRN: [id number] Date of Birth: 20-Jan-1941  Today's Date: 04/05/2021 OT Individual Time: 9798-9211   &   1030-1100 OT Individual Time Calculation (min): 60 min    &   30 min   Short Term Goals: Week 1:  OT Short Term Goal 1 (Week 1): With use of AE, pt will complete LB dressing with min assist OT Short Term Goal 2 (Week 1): Pt will complete toileting hygiene and clothing management with min assist OT Short Term Goal 3 (Week 1): Pt demonstrate dynamic standing balance with CGA during functional ADL/IADL task.  Skilled Therapeutic Interventions/Progress Updates:    Session 1:   Patient in bed, alert and ready for therapy, denies pain and states that he was not out of bed this weekend.  Supine to sitting edge of bed CS.  Sit pivot transfer bed to w/c CGA.  Sit pivot transfer w/c to shower bench CGA.  IV site, residual limb and left toe covered.  Shower completed with set up using long handled sponge, hand held shower, seated on shower bench with exception of mod A for buttocks.  Dressing completed seated on shower bench.  Set up for [facility] shirt, min A for underwear and pants, CM in stance with grab bars mod A.  Foot wear/amp shield mod/max A.  Patient c/o dizziness - HR 68, O2 99, BP 107/67 - denies symptoms after brief rest.  Completed grooming tasks with set up/mod I.  Patient remained seated in w/c in preparation for PT session to begin at this time.     Session 2:   Patient in bed, states that he had trouble breathing during PT but is feeling better since breathing treatment.  Supine to sit CS.  Sit to stand and SPT with RW to w/c CGA.  Completed w/c level conditioning, core strength, upper body and lower body exercises both seated and in stance.  Cues for breathing techniques.  Note improved core/posture and ability to push up from w/c surface.  Mild fatigue after standing activities but patient states that he is not having  SOB.  SPT back to bed at close of session with CGA.  Min A to donn amp shield, sit to supine CS.  Call bell in hand, bed alarm set.       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

## 2021-04-05 NOTE — Progress Notes (Signed)
Pt given prn benadryl and ultram at hs for sleep and itching-positive effects noted. Pt noted with bleeding for stump incision site. Area cleansed and dry dressing applied. PIV dressing changed. No distress noted. Pt compliant with CPAP most of the night.

## 2021-04-05 NOTE — Progress Notes (Signed)
Physical Therapy Session Note  Patient Details  Name: Shane [name] MRN: [id number] Date of Birth: 1941-12-02  Today's Date: 04/05/2021 PT Individual Time:Session1: 0950-1000; Session2: 4403-4742 PT Individual Time Calculation (min): 10 min & 75 min  Short Term Goals: Week 2:  PT Short Term Goal 1 (Week 2): Patient will perform sit to stand with S PT Short Term Goal 2 (Week 2): Patient will demonstrate standing balance with RW and 1 UE support with S for functional reach PT Short Term Goal 3 (Week 2): Patient will ambulate 10' with LRAD and min A. PT Short Term Goal 4 (Week 2): Patient will perform squat pivot bed<>w/c with S.  Skilled Therapeutic Interventions/Progress Updates:    Session1:  Patient in w/c and noted rasping and pt reports difficulty breathing.  RN aware and obtained dinemap for SpO2 100%, but noted pt in distress.  RN initiated breathing treatment and pt able to complete and felt a little better.  At sink performed sit to stand with CGA, attempted standing weight shifts, but pt reported feeling weak and dizzy.  In w/c assisted to bedside and performed squat pivot to bed with CGA.  Sit to supine with S.  Left to rest prior to next therapy session, RN aware, bed alarm active.   Session2:Patient in supine.  Reports feeling better.  Performed supine to sit with S using bed rail and increased time.  Donned shoe on L with max A.  Patient transfer to w/c pivot to L with CGA.  Propelled w/c to elevator x 110' with S.  Pushed with A to gym off elevator.  Patient sit to stand to RW with min A for balance, ambulated 4 x 5' with RW and min A demonstrating good L foot clearance.  Seated rest between walking bouts for catching his breath due to still somewhat SOB this pm.  Assist to tighten limb protector.  Patient standing at counter with center line @ navel.  Performed lateral weight shifts holding counter first with cues for distance, then after rest with chair back on either side for  target for lateral weight shifts x 10, then ant/post weight shifts with min A mod cues.  Performed standing hip flexion, abduction, extension all x 10 holding counter. Assisted in w/c to room, RN called to assist with breathing rx.  PAtient transferred to bed via squat pivot with bed rail with S.  Patient EOB for breathing treatment.  Performed seated LAQ on R after limb protector removed.  Patient to supine with S.  Left with bed alarm active and needs in reach.   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

## 2021-04-06 DIAGNOSIS — N182 Chronic kidney disease, stage 2 (mild): Secondary | ICD-10-CM

## 2021-04-06 LAB — BASIC METABOLIC PANEL
Anion gap: 8 (ref 5–15)
BUN: 19 mg/dL (ref 8–23)
CO2: 28 mmol/L (ref 22–32)
Calcium: 8.8 mg/dL — ABNORMAL LOW (ref 8.9–10.3)
Chloride: 98 mmol/L (ref 98–111)
Creatinine, Ser: 1.03 mg/dL (ref 0.61–1.24)
GFR, Estimated: >60 mL/min (ref >60)
Glucose, Bld: 149 mg/dL — ABNORMAL HIGH (ref 70–99)
Potassium: 4.3 mmol/L (ref 3.5–5.1)
Sodium: 134 mmol/L — ABNORMAL LOW (ref 135–145)

## 2021-04-06 LAB — CBC WITH DIFFERENTIAL/PLATELET
Abs Immature Granulocytes: 0.01 10*3/uL (ref 0.00–0.07)
Basophils Absolute: 0.1 10*3/uL (ref 0.0–0.1)
Basophils Relative: 1 %
Eosinophils Absolute: 0.7 10*3/uL — ABNORMAL HIGH (ref 0.0–0.5)
Eosinophils Relative: 10 %
HCT: 31.3 % — ABNORMAL LOW (ref 39.0–52.0)
Hemoglobin: 9.8 g/dL — ABNORMAL LOW (ref 13.0–17.0)
Immature Granulocytes: 0 %
Lymphocytes Relative: 26 %
Lymphs Abs: 1.8 10*3/uL (ref 0.7–4.0)
MCH: 27.7 pg (ref 26.0–34.0)
MCHC: 31.3 g/dL (ref 30.0–36.0)
MCV: 88.4 fL (ref 80.0–100.0)
Monocytes Absolute: 0.8 10*3/uL (ref 0.1–1.0)
Monocytes Relative: 12 %
Neutro Abs: 3.4 10*3/uL (ref 1.7–7.7)
Neutrophils Relative %: 51 %
Platelets: 303 10*3/uL (ref 150–400)
RBC: 3.54 MIL/uL — ABNORMAL LOW (ref 4.22–5.81)
RDW: 15.6 % — ABNORMAL HIGH (ref 11.5–15.5)
WBC: 6.7 10*3/uL (ref 4.0–10.5)
nRBC: 0 % (ref 0.0–0.2)

## 2021-04-06 LAB — GLUCOSE, CAPILLARY
Glucose-Capillary: 109 mg/dL — ABNORMAL HIGH (ref 70–99)
Glucose-Capillary: 118 mg/dL — ABNORMAL HIGH (ref 70–99)
Glucose-Capillary: 186 mg/dL — ABNORMAL HIGH (ref 70–99)
Glucose-Capillary: 225 mg/dL — ABNORMAL HIGH (ref 70–99)

## 2021-04-06 NOTE — Progress Notes (Signed)
Physical Therapy Session Note  Patient Details  Name: Shane [name] MRN: [id number] Date of Birth: 12/10/1940  Today's Date: 04/06/2021 PT Individual Time:Session1: 0930-1030; Session2: 1300-1330 PT Individual Time Calculation (min): 60 min & 30 min  Short Term Goals: Week 2:  PT Short Term Goal 1 (Week 2): Patient will perform sit to stand with S PT Short Term Goal 2 (Week 2): Patient will demonstrate standing balance with RW and 1 UE support with S for functional reach PT Short Term Goal 3 (Week 2): Patient will ambulate 10' with LRAD and min A. PT Short Term Goal 4 (Week 2): Patient will perform squat pivot bed<>w/c with S.  Skilled Therapeutic Interventions/Progress Updates:    Session1: Patient in supine and reports had PT this am did hopping in the room.  Breathing is good today. Patient supine to sit with S and transfer to w/c via squat pivot with S after w/c set up.  Patient in w/c propelled 57' with S.  Assisted to gym in w/c down elevator.  In ortho gym performed standing balance at BITS tapping letters in sequence A-Z with CGA and 1 UE support standing total of 1.5 minutes.  Then patient standing to tap words (25) when spoken, needed help to hear which ones, but stood over 2 minutes with CGA to min A and intermittent 1 UE support.  Patient assisted in w/c to mat and transferred with S to mat and sit to supine with S.  Performed therex as noted below along with passive stretch to bilateral hamstrings and L hip extensors and hip abductors 2 x 30 sec.  Patient supine to sit from flat mat no rails with min A (reports uses pole at home to assist with this and has for many years).  Patient transferred to w/c with S after w/c set up and applied limb protector.  Patient assisted in w/c to rom and transferred to bed with close S.  Patient to supine with S and left with all needs and call bell in reach and bed alarm active.   Mulat:  Patient in supine and reports feeling well.  Supine to sit  already with limb protector on and total A to don L shoe.  Performed sit to stand with CGA and ambulated 8' with RW and min A increased time for turn to sit in w/c.  After resting ambulated back to bed with min A noted sitting prior to backing up to bed.  Ambulated once more to w/c with RW and min A.  Patient noting increased SOB and coughing so discussed calling RN after session for breathing rx.  Patient at sink in room performed sit to stand and lateral weight shifts with targets on either side with CGA, but noting bad posture and discussed importance of posture to balance and quality over quantity in terms of weight shifting.  Patient performed ant/post weight shifts at sink after seated rest.  Noting increasing SOB so RN informed and pt transferred to bed via squat pivot with S.  Sit to supine with S and left elevated in bed with RN in the room and bed alarm active.   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 Restrictions Weight Bearing Restrictions: Yes RLE Weight Bearing: Non weight bearing Pain: Pain Assessment Pain Scale: 0-10 Pain Score: 0-No pain Exercises: Amputee Exercises Quad Sets: Strengthening;Both;10 reps;Supine (with ball between legs) Hip Extension: Strengthening;Both;10 reps;Prone Hip  ABduction/ADduction: Strengthening;Both;10 reps;Sidelying Knee Flexion: Strengthening;Both;10 reps (prone) Straight Leg Raises: Strengthening;Both;10 reps;Supine Chair Push Up: Strengthening;Both;10 reps;Seated Other Exercises Other Exercises: Bridging on L with R leg lift 5 sec hold x 10    Therapy/Group: Individual Therapy  [place], PT 04/06/2021, 10:08 AM

## 2021-04-06 NOTE — Progress Notes (Signed)
Patient ID: Shane [name], male   DOB: 1941/04/27, 80 y.o.   MRN: [id number] Spoke with wife who expressed concerns regarding pt not getting up to use the restroom or side of the bed for the urinal and if he is meeting his goals for discharge on 5/10? She also needs another stump shrinker due to she somehow lost the one she was taking home to wash between here and home. Will follow up after conference tomorrow.

## 2021-04-06 NOTE — Progress Notes (Signed)
Physical Therapy Session Note  Patient Details  Name: Shane [name] MRN: [id number] Date of Birth: 09/02/1941  Today's Date: 04/06/2021 PT Individual Time: 0800-0828 PT Individual Time Calculation (min): 28 min   Short Term Goals: Week 2:  PT Short Term Goal 1 (Week 2): Patient will perform sit to stand with S PT Short Term Goal 2 (Week 2): Patient will demonstrate standing balance with RW and 1 UE support with S for functional reach PT Short Term Goal 3 (Week 2): Patient will ambulate 10' with LRAD and min A. PT Short Term Goal 4 (Week 2): Patient will perform squat pivot bed<>w/c with S.  Skilled Therapeutic Interventions/Progress Updates:    Pt greeted supine in bed, awake and agreeable to therapy. Reports good nights rest and no reports of pain. Pt with questions regarding his daily physical therapy schedule as he didn't receive a physical copy. Provided this to patient in order to prepare for daily therapy, pt thankful.   Supine<>sit with hospital bed features with supervision. Pt removed t-shirt and donned t-shirt with setupA while seated EOB. He also donned atheltic shorts with setupA but needed minA for pulling over hips in standing. Sit<>stand from raised EOB height to RW with CGA and needs minA for balance while attempting to pull up shorts.  Donned limb-guard with modA while seated EOB and donned L DARCO shoe with totalA for time management. MD arriving for morning rounds.  Sit<>stand with CGA and RW. Pt hopped ~43ft from EOB to w/c with minA for balance. Demo's poor R foot clearance and decreased hopping efficiency, modA needed for balance during safety approach and hopping backwards to his w/c prior to sitting. Pt reports 5/10 RPE after short distance hopping.   Instructed on repeated sit<>stands, 1x5, with minA to RW from w/c height - pt rely's heavily on the back of his legs to assist with powering to rise. Cues for UE placement and therapeutic breathing.  P{t requesting to  get back to bed at end of session. Stand<>pivot transfer with use of bedrail from w/c to EOB with minA. Removed DARCO shoe with totalA for time management pt limb guard remained on. Sit>supine with supervision and pt remained in bed with bed alarm on and needs in reach at end of session.   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

## 2021-04-06 NOTE — Progress Notes (Signed)
Occupational Therapy Session Note  Patient Details  Name: Shane [name] MRN: [id number] Date of Birth: Dec 25, 1940  Today's Date: 04/06/2021 OT Individual Time: 8563-1497 OT Individual Time Calculation (min): 55 min    Short Term Goals: Week 2:  OT Short Term Goal 1 (Week 2): Patient will complete SPT with RW CS level with RW and appropriate DME OT Short Term Goal 2 (Week 2): patient will complete toileting with CS OT Short Term Goal 3 (Week 2): patient will complete bathing with set up/CS  Skilled Therapeutic Interventions/Progress Updates:    Patient found semifowlers in bed w/ amp shield donned, no pain reported, and agreeable to therapy. Supine<>sitting EOB CS. Stand pivot transfer bed>w/c using RW CS. Patient self-propelled ~137f to rehab gym w/ noted UE fatigue requiring rest break. Sit pivot transfer w/c<>therapy mat CS. Patient participated in BStellaconditioning activities to increase activity tolerance, ADL performance, and improve static/dynamic sitting balance. Patient donned/doffed amp shield, non-skid sock, and L Darco shoe using AE (reacher, sock aid) w/ CS and min vc's to ensure correct fit of Darco shoe. Patient returned to room following session, sit pivot transfer w/c>bed CS and independent bed mobility scooting to [place] supine. Pt left with all needs met, L Darco shoe off, bed alarm set, and call bell within reach.  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

## 2021-04-06 NOTE — Progress Notes (Signed)
PROGRESS NOTE   Subjective/Complaints: Patient seen sitting up at the edge of his bed this morning working with therapies.  Good sitting balance noted.  He states he slept well overnight.  ROS: + Shortness of breath (baseline, unchanged).  Denies CP, N/V/D  Objective:   No results found. Recent Labs    04/06/21 0717  WBC 6.7  HGB 9.8*  HCT 31.3*  PLT 303   Recent Labs    04/06/21 0717  NA 134*  K 4.3  CL 98  CO2 28  GLUCOSE 149*  BUN 19  CREATININE 1.03  CALCIUM 8.8*    Intake/Output Summary (Last 24 hours) at 04/06/2021 1131 Last data filed at 04/06/2021 0840 Gross per 24 hour  Intake 118 ml  Output 1075 ml  Net -957 ml     Pressure Injury 03/26/21 Toe (Comment  which one) Anterior;Left Unstageable - Full thickness tissue loss in which the base of the injury is covered by slough (yellow, tan, gray, green or brown) and/or eschar (tan, brown or black) in the wound bed. pressu (Active)  03/26/21 1423  Location: Toe (Comment  which one)  Location Orientation: Anterior;Left  Staging: Unstageable - Full thickness tissue loss in which the base of the injury is covered by slough (yellow, tan, gray, green or brown) and/or eschar (tan, brown or black) in the wound bed.  Wound Description (Comments): pressure injury on medial aspect of great toe knuckle  Present on Admission: Yes    Physical Exam: Vital Signs Blood pressure 123/65, pulse 63, temperature 97.7 F (36.5 C), temperature source Oral, resp. rate 17, height 6' (1.829 m), weight 103.7 kg, SpO2 98 %. Constitutional: No distress . Vital signs reviewed.  Morbidly obese. HENT: Normocephalic.  Atraumatic. Eyes: EOMI. No discharge. Cardiovascular: No JVD.  Irregularly irregular. Respiratory: Normal effort.  No stridor.  Bilateral clear to auscultation. GI: Non-distended.  BS +. Skin: Warm and dry.   Right BKA with dressing CDI Left toe with dressing  CDI. Psych: Normal mood.  Normal behavior. Musc: Left BKA with improving edema and tenderness Neuro: Alert Motor: Grossly 5/5 throughout  Assessment/Plan: 1. Functional deficits which require 3+ hours per day of interdisciplinary therapy in a comprehensive inpatient rehab setting.  Physiatrist is providing close team supervision and 24 hour management of active medical problems listed below.  Physiatrist and rehab team continue to assess barriers to discharge/monitor patient progress toward functional and medical goals  Care Tool:  Bathing    Body parts bathed by patient: Right arm,Left arm,Chest,Abdomen,Front perineal area,Right upper leg,Left upper leg,Face,Left lower leg   Body parts bathed by helper: Buttocks Body parts n/a: Right lower leg   Bathing assist Assist Level: Minimal Assistance - Patient > 75%     Upper Body Dressing/Undressing Upper body dressing   What is the patient wearing?: Pull over shirt    Upper body assist Assist Level: Set up assist    Lower Body Dressing/Undressing Lower body dressing      What is the patient wearing?: Underwear/pull up,Pants     Lower body assist Assist for lower body dressing: Minimal Assistance - Patient > 75%     Toileting Toileting Toileting Activity did  not occur Landscape architect and hygiene only): N/A (no void or bm)  Toileting assist Assist for toileting: Minimal Assistance - Patient > 75%     Transfers Chair/bed transfer  Transfers assist     Chair/bed transfer assist level: Contact Guard/Touching assist     Locomotion Ambulation   Ambulation assist      Assist level: Minimal Assistance - Patient > 75% Assistive device: Walker-rolling Max distance: 5'   Walk 10 feet activity   Assist  Walk 10 feet activity did not occur: Safety/medical concerns        Walk 50 feet activity   Assist Walk 50 feet with 2 turns activity did not occur: Safety/medical concerns         Walk 150 feet  activity   Assist Walk 150 feet activity did not occur: Safety/medical concerns         Walk 10 feet on uneven surface  activity   Assist Walk 10 feet on uneven surfaces activity did not occur: Safety/medical concerns         Wheelchair     Assist Will patient use wheelchair at discharge?: Yes Type of Wheelchair: Manual    Wheelchair assist level: Supervision/Verbal cueing Max wheelchair distance: 110'    Wheelchair 50 feet with 2 turns activity    Assist        Assist Level: Supervision/Verbal cueing   Wheelchair 150 feet activity     Assist  Wheelchair 150 feet activity did not occur:  (SOB)   Assist Level: Contact Guard/Touching assist,Minimal Assistance - Patient > 75%    Medical Problem List and Plan: 1.  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  Wound VAC removed 03/24/2021.  Continue CIR 2.  Antithrombotics: -DVT/anticoagulation: Eliquis             -antiplatelet therapy: N/A 3. Pain Management: Neurontin 300 mg 3 times daily             Controlled with meds on 5/3  Monitor with increased exertion, particularly for phantom limb pain 4. Anxiety: Continue Lexapro 10 mg daily, Ativan 1 mg nightly             -antipsychotic agents: N/A 5. Neuropsych: This patient is capable of making decisions on his own behalf. 6. Skin/Wound Care: continue shrinker to stump  -Santyl ordered to left great toe -surgery to evaluate 7. Fluids/Electrolytes/Nutrition: Routine in and outs 8.  Acute blood loss anemia.  Continue iron supplement.    Hemoglobin 9.8 on 5/3  Continue to monitor 9.    Blood pressure: d/ced metoprolol.   ?  Improving control on 5/3 10.  Diabetes mellitus with hyperglycemia.  Latest hemoglobin A1c 7.8.  NovoLog 4 units 3 times daily with meals, will likely need to increase  Lantus insulin 12 units nightly decreased to 8 on 4/27.    Check blood sugars before meals and at bedtime  Labile on 5/3,  monitor for trend 11.  GERD.  Continue Protonix 12.  Dyslipidemia: Continue Lipitor 13.  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  Pacemaker removed 12/14/2020 due to staph bacteremia.  Follow-up cardiology services 14.  Idiopathic pulmonary fibrosis.  Continue CPAP 15.  Atrial fibrillation.  Cardiac rate controlled, regular.  Continue Eliquis.             Monitor increase activity 16.  CAD with CABG.  No chest pain or increasing shortness of breath 17.  CKD stage II/III.    Creatinine  1.03 on 5/3  Continue to monitor 18.  Hyponatremia  Sodium 134 on 5/3  Continue to monitor 19. Pulmonary fibrosis   Last infusion on 4/26  Albuterol scheduled (?  Home medication), discontinued by respiratory, reordered   LOS: 11 days A FACE TO FACE EVALUATION WAS PERFORMED  Davarious Tumbleson Lorie Phenix 04/06/2021, 11:31 AM

## 2021-04-07 LAB — GLUCOSE, CAPILLARY
Glucose-Capillary: 126 mg/dL — ABNORMAL HIGH (ref 70–99)
Glucose-Capillary: 103 mg/dL — ABNORMAL HIGH (ref 70–99)
Glucose-Capillary: 113 mg/dL — ABNORMAL HIGH (ref 70–99)
Glucose-Capillary: 138 mg/dL — ABNORMAL HIGH (ref 70–99)

## 2021-04-07 MED ORDER — INSULIN ASPART 100 UNIT/ML ~~LOC~~ SOLN
6.0000 [IU] | Freq: Three times a day (TID) | SUBCUTANEOUS | Status: DC
  Administered 2021-04-07 – 2021-04-13 (×18): 6 [IU] via SUBCUTANEOUS

## 2021-04-07 NOTE — Progress Notes (Addendum)
PROGRESS NOTE   Subjective/Complaints: Patient seen sitting up in bed this morning.  He states he slept well overnight.  He states he had some shortness of breath yesterday, which resolved after breathing treatment.  Discussed wound dressing with patient and nursing.  ROS: + Shortness of breath (baseline).  Denies CP, N/V/D  Objective:   No results found. Recent Labs    04/06/21 0717  WBC 6.7  HGB 9.8*  HCT 31.3*  PLT 303   Recent Labs    04/06/21 0717  NA 134*  K 4.3  CL 98  CO2 28  GLUCOSE 149*  BUN 19  CREATININE 1.03  CALCIUM 8.8*    Intake/Output Summary (Last 24 hours) at 04/07/2021 1059 Last data filed at 04/07/2021 0800 Gross per 24 hour  Intake 714 ml  Output 1200 ml  Net -486 ml     Pressure Injury 03/26/21 Toe (Comment  which one) Anterior;Left Unstageable - Full thickness tissue loss in which the base of the injury is covered by slough (yellow, tan, gray, green or brown) and/or eschar (tan, brown or black) in the wound bed. pressu (Active)  03/26/21 1423  Location: Toe (Comment  which one)  Location Orientation: Anterior;Left  Staging: Unstageable - Full thickness tissue loss in which the base of the injury is covered by slough (yellow, tan, gray, green or brown) and/or eschar (tan, brown or black) in the wound bed.  Wound Description (Comments): pressure injury on medial aspect of great toe knuckle  Present on Admission: Yes    Physical Exam: Vital Signs Blood pressure 130/72, pulse 60, temperature 97.9 F (36.6 C), temperature source Oral, resp. rate 20, height 6' (1.829 m), weight 103.7 kg, SpO2 100 %. Constitutional: No distress . Vital signs reviewed. Morbidly obese.  HENT: Normocephalic.  Atraumatic. Eyes: EOMI. No discharge. Cardiovascular: No JVD.  Irregularly irregular.  Respiratory: Normal effort.  No stridor.  Bilateral clear to auscultation. GI: Non-distended.  BS +. Skin: Warm and  dry.   Right BKA with staples CDI Left toe with dressing CDI Psych: Normal mood.  Normal behavior. Musc: Left BKA with improving edema and tenderness Neuro: Alert Motor: Grossly 5/5 throughout, unchanged  Assessment/Plan: 1. Functional deficits which require 3+ hours per day of interdisciplinary therapy in a comprehensive inpatient rehab setting.  Physiatrist is providing close team supervision and 24 hour management of active medical problems listed below.  Physiatrist and rehab team continue to assess barriers to discharge/monitor patient progress toward functional and medical goals  Care Tool:  Bathing    Body parts bathed by patient: Right arm,Left arm,Chest,Abdomen,Front perineal area,Right upper leg,Left upper leg,Face,Left lower leg   Body parts bathed by helper: Buttocks Body parts n/a: Right lower leg   Bathing assist Assist Level: Minimal Assistance - Patient > 75%     Upper Body Dressing/Undressing Upper body dressing   What is the patient wearing?: Pull over shirt    Upper body assist Assist Level: Set up assist    Lower Body Dressing/Undressing Lower body dressing      What is the patient wearing?: Underwear/pull up,Pants     Lower body assist Assist for lower body dressing: Minimal Assistance -  Patient > 75%     Toileting Toileting Toileting Activity did not occur Landscape architect and hygiene only): N/A (no void or bm)  Toileting assist Assist for toileting: Minimal Assistance - Patient > 75%     Transfers Chair/bed transfer  Transfers assist     Chair/bed transfer assist level: Supervision/Verbal cueing     Locomotion Ambulation   Ambulation assist      Assist level: Minimal Assistance - Patient > 75% Assistive device: Walker-rolling Max distance: 8'   Walk 10 feet activity   Assist  Walk 10 feet activity did not occur: Safety/medical concerns        Walk 50 feet activity   Assist Walk 50 feet with 2 turns activity  did not occur: Safety/medical concerns         Walk 150 feet activity   Assist Walk 150 feet activity did not occur: Safety/medical concerns         Walk 10 feet on uneven surface  activity   Assist Walk 10 feet on uneven surfaces activity did not occur: Safety/medical concerns         Wheelchair     Assist Will patient use wheelchair at discharge?: Yes Type of Wheelchair: Manual    Wheelchair assist level: Supervision/Verbal cueing Max wheelchair distance: 110'    Wheelchair 50 feet with 2 turns activity    Assist        Assist Level: Supervision/Verbal cueing   Wheelchair 150 feet activity     Assist  Wheelchair 150 feet activity did not occur:  (SOB)   Assist Level: Contact Guard/Touching assist,Minimal Assistance - Patient > 75%    Medical Problem List and Plan: 1.  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  Wound VAC removed 03/24/2021.  Continue CIR  Stump shrinker ordered  Team conference today to discuss current and goals and coordination of care, home and environmental barriers, and discharge planning with nursing, case manager, and therapies. Please see conference note from today as well.  2.  Antithrombotics: -DVT/anticoagulation: Eliquis             -antiplatelet therapy: N/A 3. Pain Management: Neurontin 300 mg 3 times daily             Controlled with meds on 5/4  Monitor with increased exertion, particularly for phantom limb pain 4. Anxiety: Continue Lexapro 10 mg daily, Ativan 1 mg nightly             -antipsychotic agents: N/A 5. Neuropsych: This patient is capable of making decisions on his own behalf. 6. Skin/Wound Care: continue shrinker to stump  -Santyl ordered to left great toe -surgery to evaluate 7. Fluids/Electrolytes/Nutrition: Routine in and outs 8.  Acute blood loss anemia.  Continue iron supplement.    Hemoglobin 9.8 on 5/3  Continue to monitor 9.    Blood pressure:  d/ced metoprolol.   Controlled on 5/4 10.  Diabetes mellitus with hyperglycemia.  Latest hemoglobin A1c 7.8.  NovoLog 4 units 3 times daily with meals, increased to 6U on 5/4  Lantus insulin 12 units nightly decreased to 8 on 4/27.    Check blood sugars before meals and at bedtime 11.  GERD.  Continue Protonix 12.  Dyslipidemia: Continue Lipitor 13.  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  Pacemaker removed 12/14/2020 due to staph bacteremia.  Follow-up cardiology services 14.  Idiopathic pulmonary fibrosis.  Continue CPAP 15.  Atrial fibrillation.  Cardiac rate controlled, regular.  Continue Eliquis.             Monitor increase activity 16.  CAD with CABG.  No chest pain or increasing shortness of breath 17.  CKD stage II/III.    Creatinine 1.03 on 5/3  Continue to monitor 18.  Hyponatremia  Sodium 134 on 5/3  Continue to monitor 19. Pulmonary fibrosis   Last infusion on 4/26  Albuterol scheduled (?  Home medication), discontinued by respiratory, reordered  Stable   LOS: 12 days A FACE TO FACE EVALUATION WAS PERFORMED  Relena Ivancic Lorie Phenix 04/07/2021, 10:59 AM

## 2021-04-07 NOTE — Progress Notes (Signed)
Physical Therapy Session Note  Patient Details  Name: Shane [name] MRN: [id number] Date of Birth: May 13, 1941  Today's Date: 04/07/2021 PT Individual Time: 1030-1109 PT Individual Time Calculation (min): 39 min   Short Term Goals: Week 1:  PT Short Term Goal 1 (Week 1): Pt will perform supine<>sit mod-I PT Short Term Goal 1 - Progress (Week 1): Progressing toward goal PT Short Term Goal 2 (Week 1): Pt will perform sit<>stands using LRAD with CGA PT Short Term Goal 2 - Progress (Week 1): Met PT Short Term Goal 3 (Week 1): Pt will perform bed<>chair transfers using LRAD with CGA PT Short Term Goal 3 - Progress (Week 1): Met PT Short Term Goal 4 (Week 1): Pt will propel wheelchair at least 186f with supervision PT Short Term Goal 4 - Progress (Week 1): Met PT Short Term Goal 5 (Week 1): Pt will ambulate at least 120fusing LRAD with CGA PT Short Term Goal 5 - Progress (Week 1): Progressing toward goal Week 2:  PT Short Term Goal 1 (Week 2): Patient will perform sit to stand with S PT Short Term Goal 2 (Week 2): Patient will demonstrate standing balance with RW and 1 UE support with S for functional reach PT Short Term Goal 3 (Week 2): Patient will ambulate 10' with LRAD and min A. PT Short Term Goal 4 (Week 2): Patient will perform squat pivot bed<>w/c with S.  Skilled Therapeutic Interventions/Progress Updates:   Received pt semi-reclined in bed, pt agreeable to therapy, and denied any pain during session. Session with focus on functional mobility/transfers, generalized strengthening, dynamic standing balance/coordination, and improved activity tolerance. Pt transferred supine<>sitting EOB with HOB elevated and supervision and donned L darco shoe with max A. Pt transferred bed<>WC stand<>pivot with RW and CGA and donned WC gloves. Performed WC mobility [id number] 1 and [id number] using BUE and supervision to 26M ortho gym with increased time. Pt able to manage WC parts and set up transfer to  Nustep with supervision and min cues. Squat<>pivot WC<>Nustep with CGA and performed BUE and LLE strengthening on Nustep at workload 5 for 8 minutes and 15 seconds for a total of 398 steps for improved cardiovascular endurance. Pt required rest breaks afterwards due to mild SOB. [facility] and requested to return to bed. Squat<>pivot WC<>bed with CGA and sit<>supine with supervision. Concluded session with pt semi-reclined in bed, needs within reach, and bed alarm on.   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

## 2021-04-07 NOTE — Progress Notes (Signed)
Patient ID: Shane [name], male   DOB: May 05, 1941, 80 y.o.   MRN: [id number]  Met with pt and spoke with wife via telephone to discuss team conference progress toward his goals and discharge still 5/10. Wife has concerns regarding his tolieting and making sure meeting goals. Have scheduled family education for Friday at 10-12 so she can see him in therapies. Since some of the tolieitting issues are pt refusing to go to the restroom and wanting to use the bed pan or urinal. Hopefully with wife being here during therapies here concerns will be addressed and questions answered.

## 2021-04-07 NOTE — Patient Care Conference (Signed)
Inpatient RehabilitationTeam Conference and Plan of Care Update Date: 04/07/2021   Time: 11:51 AM    Patient Name: Shane [name]      Medical Record Number: [id number]  Date of Birth: [birth year] Sex: Male         Room/Bed: 3I95J/8A41Y-60 Payor Info: Payor: [employer] / Plan: [facility] MEDICARE / Product Type: *No Product type* /    Admit Date/Time:  03/26/2021 12:42 PM  Primary Diagnosis:  Right below-knee amputee [facility])  Hospital Problems: Principal Problem:   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[place]) Active Problems:   Hyponatremia   Labile blood glucose   Pulmonary fibrosis ([facility])   CKD (chronic kidney disease), stage II    Expected Discharge Date: Expected Discharge Date: 04/13/21  Team Members Present: Physician leading conference: Dr. Delice Lesch Care Coodinator Present: Dorien Chihuahua, RN, BSN, CRRN;Becky Dupree, LCSW Nurse Present: Dorien Chihuahua, RN PT Present: Magda Kiel, PT OT Present: Simonne Come, OT PPS Coordinator present : Ileana Ladd, PT     Current Status/Progress Goal Weekly Team Focus  Bowel/Bladder             Swallow/Nutrition/ Hydration             ADL's   functional transfers - sit pivot CS, stand pivot w/ RW CS/CGA, bathing min A, dressing min A  CS  upper body conditioning, confirming DME, use of AE, adl/transfer training, energy conservation, family education   Mobility   S bed mobility, S squat pivot transfers, CGA Sit to stand, ambulating with RW 8' min A, car transfer RW CGA. w/c mobility up to 120' with S  S to CGA, 25' gait with RW.  gait training, standing balance/tolerance, transfers, UE/LE strength   Communication             Safety/Cognition/ Behavioral Observations            Pain             Skin               Discharge Planning:  Wife will hire assist while she works so pt will have 24/7 care. Wife concerned regarding still using bed pan and not urinal on side of bed and not going into the bathroom.   Team  Discussion: Labs improved. Steady progress noted.  Patient on target to meet rehab goals: yes, currently min assist for bathing and dressing. Min assist for squat pivot transfers and able to ambulate 10' with CGA. Progress limited by upper body deconditioning and lung issue = energy conservation tips. Supervision goals set for discharge  *See Care Plan and progress notes for long and short-term goals.   Revisions to Treatment Plan:   Teaching Needs: Residual limb care, transfers, toileting, medications, etc.   Current Barriers to Discharge: Decreased caregiver support and Home enviroment access/layout  Possible Resolutions to Barriers: Home health services follow up recommended; resume pulmonary rehab once cleared Hired caregivers to assist wife Tub bench and BSC recommended for discharge     Medical Summary Current Status: Decreased functional mobility secondary to right BKA 03/17/2021 due to gangrene.  Barriers to Discharge: Weight;Medical stability;Wound care;Other (comments)  Barriers to Discharge Comments: Pulmonary fibrosis Possible Resolutions to Barriers/Weekly Focus: Therapies, follow labs - Hb, Cr, Hb, optimize DM meds, breathing treatments   Continued Need for Acute Rehabilitation Level of Care: The patient requires daily medical management by a physician with specialized training in physical medicine and rehabilitation for the following reasons: Direction of a multidisciplinary physical  rehabilitation program to maximize functional independence : Yes Medical management of patient stability for increased activity during participation in an intensive rehabilitation regime.: Yes Analysis of laboratory values and/or radiology reports with any subsequent need for medication adjustment and/or medical intervention. : Yes   I attest that I was present, lead the team conference, and concur with the assessment and plan of the team.   Dorien Chihuahua B 04/07/2021, 2:52 PM

## 2021-04-07 NOTE — Progress Notes (Signed)
Occupational Therapy Session Note  Patient Details  Name: Shane [name] MRN: [id number] Date of Birth: [birth year]  Today's Date: 04/07/2021 OT Individual Time: 7412-8786 OT Individual Time Calculation (min): 70 min    Short Term Goals: Week 2:  OT Short Term Goal 1 (Week 2): Patient will complete SPT with RW CS level with RW and appropriate DME OT Short Term Goal 2 (Week 2): patient will complete toileting with CS OT Short Term Goal 3 (Week 2): patient will complete bathing with set up/CS  Skilled Therapeutic Interventions/Progress Updates:    Pt received supine with no c/o pain. Pt completed bed mobility with heavy use of bedrail, supervision overall. He required max A to don darco shoe. Stand [facility] min A using RW. Discussion re home transfers, accessibility, and safety. Pt completed transfer back to w/c and then to TTB in shower. RLE, IV site, and L foot occluded for shower. Pt required min A to doff clothing seated on TTB. He completed bathing with cueing for lateral leans to access posterior peri hygiene but no physical assist. LH sponge used. Pt completed transfer back to w/c with CGA. Pt donned shirt with set up assist. Min A to don underwear. Pt returned to EOB for BUE strengthening circuit using level 2 resistance band. Pt required min cueing for correct technique and muscle activation. Pt returned to supine in bed and was left with all needs met bed alarm set.   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

## 2021-04-07 NOTE — Plan of Care (Signed)
  Problem: Consults Goal: RH LIMB LOSS PATIENT EDUCATION Description: Description: See Patient Education module for eduction specifics. Outcome: Progressing   Problem: RH BOWEL ELIMINATION Goal: RH STG MANAGE BOWEL WITH ASSISTANCE Description: STG Manage Bowel with mod I Assistance. Outcome: Progressing   Problem: RH SKIN INTEGRITY Goal: RH STG MAINTAIN SKIN INTEGRITY WITH ASSISTANCE Description: STG Maintain Skin Integrity With min Assistance. Outcome: Progressing   Problem: RH SAFETY Goal: RH STG ADHERE TO SAFETY PRECAUTIONS W/ASSISTANCE/DEVICE Description: STG Adhere to Safety Precautions With cues/reminders Assistance/Device. Outcome: Progressing   Problem: RH PAIN MANAGEMENT Goal: RH STG PAIN MANAGED AT OR BELOW PT'S PAIN GOAL Description: At or below level 4 Outcome: Progressing   Problem: RH KNOWLEDGE DEFICIT LIMB LOSS Goal: RH STG INCREASE KNOWLEDGE OF SELF CARE AFTER LIMB LOSS Description: Patient will be able to manage care at discharge using handouts and educatioanal tools independently Outcome: Progressing

## 2021-04-07 NOTE — Progress Notes (Signed)
Physical Therapy Session Note  Patient Details  Name: Shane [name] MRN: [id number] Date of Birth: 1941/04/10  Today's Date: 04/07/2021 PT Individual Time: 8416-6063 PT Individual Time Calculation (min): 75 min   Short Term Goals: Week 2:  PT Short Term Goal 1 (Week 2): Patient will perform sit to stand with S PT Short Term Goal 2 (Week 2): Patient will demonstrate standing balance with RW and 1 UE support with S for functional reach PT Short Term Goal 3 (Week 2): Patient will ambulate 10' with LRAD and min A. PT Short Term Goal 4 (Week 2): Patient will perform squat pivot bed<>w/c with S.  Skilled Therapeutic Interventions/Progress Updates:    Patient in supine without complaints.  Performed supine to sit with S after assist to don limb protector.  Donned shoe seated EOB with max A.  Squat pivot to w/c with S.  Propelled w/c to elevator x 120' with S.  Practiced furniture transfers in ADL apartment initially discussing his recliner at home which is lift chair with transfer pole next so did not attempt to simulate.  Reports uses office desk chair and no locks so practiced to desk chair at table in kitchen and how pt braces against wall, etc using desk to stabilize as it is built in.  Performed with min A to and from desk chair.  Standing weight shifts at sink in kitchen x 10 R/L with CGA and mod cues for posture, then x 10 ant/post. Patient in w/c assisted to therapy gym.  Patient sit to stand with min to CGA to RW and ambulated x 10' with min A +1 for w/c follow.  Patient ambulated second time x 10' with min A to mat in gym.  Patient sit to supine S and performed therex as noted below. Patient supine to sit with S from flat mat.  Performed squat pivot to w/c with S.  Assisted to room in w/c and transferred to bed with S after w/c set up.  Patient left in supine with call bell and needs in reach and bed alarm active.   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 Restrictions Weight Bearing Restrictions: Yes RLE Weight Bearing: Non weight bearing Pain: Pain Assessment Pain Scale: 0-10 Pain Score: 0-No pain   Exercises: Amputee Exercises Quad Sets: Strengthening;Both;10 reps;Supine (squeezing ball between legs) Hip Extension: Strengthening;Both;10 reps;Prone Hip ABduction/ADduction: Strengthening;Both;10 reps;Sidelying Knee Flexion: Strengthening;Both;10 reps Knee Extension: Strengthening;Left;Right;10 reps;Seated (prone) Straight Leg Raises: Strengthening;Both;10 reps;Supine Other Exercises Other Exercises: Bridging on L with R leg lift 5 sec hold x 10   Therapy/Group: Individual Therapy  Reginia Naas  Magda Kiel, PT 04/07/2021, 9:42 AM

## 2021-04-08 DIAGNOSIS — G8918 Other acute postprocedural pain: Secondary | ICD-10-CM

## 2021-04-08 LAB — GLUCOSE, CAPILLARY
Glucose-Capillary: 101 mg/dL — ABNORMAL HIGH (ref 70–99)
Glucose-Capillary: 107 mg/dL — ABNORMAL HIGH (ref 70–99)
Glucose-Capillary: 86 mg/dL (ref 70–99)
Glucose-Capillary: 139 mg/dL — ABNORMAL HIGH (ref 70–99)

## 2021-04-08 NOTE — Progress Notes (Signed)
Occupational Therapy Session Note  Patient Details  Name: Shane [name] MRN: [id number] Date of Birth: Feb 14, 1941  Today's Date: 04/08/2021 OT Individual Time: 0800-0900 OT Individual Time Calculation (min): 60 min    Short Term Goals: Week 1:  OT Short Term Goal 1 (Week 1): With use of AE, pt will complete LB dressing with min assist OT Short Term Goal 1 - Progress (Week 1): Met OT Short Term Goal 2 (Week 1): Pt will complete toileting hygiene and clothing management with min assist OT Short Term Goal 2 - Progress (Week 1): Progressing toward goal OT Short Term Goal 3 (Week 1): Pt demonstrate dynamic standing balance with CGA during functional ADL/IADL task. OT Short Term Goal 3 - Progress (Week 1): Met  Skilled Therapeutic Interventions/Progress Updates:    Pt received in bed with no pain reported during sesison.  ADL:  Pt completes UB dressing with setup EOB, LB dressing with MIN A for donning pants sit to stand with A for balancew with RW advancing pants past hips, and MIN A for donning shoe with stool EOB. Pt defered grooming and bathing at this time  Therapeutic activity Pt completes multiple stand pivot transfers with MIN A and RW fading to CGA overall with imrpoved foot clearnace with Darco shoe donned with VC safe reach back and positioning prior to transfer. Pt completes lateral scooting on mat for scapular stability in prep for functional transfers. 2x5 modified sit ups with large foam wedge under back and 2Kg ball and laterall stool slides for postural control requirred during BADLs and functional transfers. 2x5 w/c push ups for tricep activation in prep for functional transfers with RW. 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

## 2021-04-08 NOTE — Progress Notes (Signed)
Physical Therapy Session Note  Patient Details  Name: Shane [name] MRN: [id number] Date of Birth: 08-01-1941  Today's Date: 04/08/2021 PT Individual Time: 1420-1530 PT Individual Time Calculation (min): 70 min   Short Term Goals: Week 2:  PT Short Term Goal 1 (Week 2): Patient will perform sit to stand with S PT Short Term Goal 2 (Week 2): Patient will demonstrate standing balance with RW and 1 UE support with S for functional reach PT Short Term Goal 3 (Week 2): Patient will ambulate 10' with LRAD and min A. PT Short Term Goal 4 (Week 2): Patient will perform squat pivot bed<>w/c with S.  Skilled Therapeutic Interventions/Progress Updates:    Patient in supine and reports arms tired from previous therapies.  Performed supine to sit with S and assist for shoe on.  Patient transferred to w/c with squat pivot with S.  Pushed in w/c to therapy gym as pt reports already pushed to and from and arms were tired.  Patient sit to stand to RW CGA and ambulated to mat x 10' with CGA.  Performed seated and supine therex as noted below. Patient supine to sit CGA as reports arms feeling weak.  Performed mat > w/c with S.  Patient in w/c transported to ortho gym.  Squat pivot to Nu Step min A and pt performed 6' on Nu Step at level 3 with bilateral UE and L LE.  Patient sit to stand to RW CGA and stand pivot to w/c with RW and min A.  Patient in w/c assisted to room and squat pivot to bed with S.  Patient sit to supine with S.  Assisted to doff limb guard and pt left in supine with call bell and needs in reach and bed alarm active.   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 Restrictions Weight Bearing Restrictions: Yes RLE Weight Bearing: Weight bearing as tolerated Pain: Pain Assessment Pain Score: 0-No pain Exercises: General Exercises - Lower  Extremity Long Arc Quad: 15 reps;Strengthening;Seated;Both (5# on L) Repetitive Sit to Stands: Two upper extremities (5 reps) Amputee Exercises Quad Sets: Strengthening;Both;Supine;15 reps Hip Extension: Strengthening;Both;Prone;15 reps Hip ABduction/ADduction: Strengthening;Both;Sidelying;15 reps Knee Flexion: Strengthening;Both;15 reps (prone) Other Exercises Other Exercises: Bridging on L with R leg lift 5 sec hold x 15 Other Exercises: R hip adduction with leg on stool in sidelying x 10 Other Exercises: bridging with both legs on stool in supine x 10    Therapy/Group: Individual Therapy  Reginia Naas  [place], PT 04/08/2021, 3:00 PM

## 2021-04-08 NOTE — Progress Notes (Signed)
PROGRESS NOTE   Subjective/Complaints: Patient seen sitting up in his chair this AM, working with therapies.  He states he slept well overnight.  He states he did not require breathing treatment yesterday.   ROS: Denies CP, SOB, N/V/D  Objective:   No results found. Recent Labs    04/06/21 0717  WBC 6.7  HGB 9.8*  HCT 31.3*  PLT 303   Recent Labs    04/06/21 0717  NA 134*  K 4.3  CL 98  CO2 28  GLUCOSE 149*  BUN 19  CREATININE 1.03  CALCIUM 8.8*    Intake/Output Summary (Last 24 hours) at 04/08/2021 1105 Last data filed at 04/08/2021 0720 Gross per 24 hour  Intake 540 ml  Output 1250 ml  Net -710 ml     Pressure Injury 03/26/21 Toe (Comment  which one) Anterior;Left Unstageable - Full thickness tissue loss in which the base of the injury is covered by slough (yellow, tan, gray, green or brown) and/or eschar (tan, brown or black) in the wound bed. pressu (Active)  03/26/21 1423  Location: Toe (Comment  which one)  Location Orientation: Anterior;Left  Staging: Unstageable - Full thickness tissue loss in which the base of the injury is covered by slough (yellow, tan, gray, green or brown) and/or eschar (tan, brown or black) in the wound bed.  Wound Description (Comments): pressure injury on medial aspect of great toe knuckle  Present on Admission: Yes    Physical Exam: Vital Signs Blood pressure 133/61, pulse 62, temperature 97.9 F (36.6 C), resp. rate 14, height 6' (1.829 m), weight 103.7 kg, SpO2 100 %.  Constitutional: No distress . Vital signs reviewed. Morbidly obese.  HENT: Normocephalic.  Atraumatic. Eyes: EOMI. No discharge. Cardiovascular: No JVD.  Irregularly irregular.  Respiratory: Normal effort.  No stridor.  Bilateral clear to auscultation. GI: Non-distended.  BS +. Skin: Warm and dry.   Right BKA with dressing CDI Left toe with dressing CDI Psych: Normal mood.  Normal behavior. Musc: Left  BKA with improving edema and tenderness Neuro: Alert Motor: Grossly 5/5 throughout, stable  Assessment/Plan: 1. Functional deficits which require 3+ hours per day of interdisciplinary therapy in a comprehensive inpatient rehab setting.  Physiatrist is providing close team supervision and 24 hour management of active medical problems listed below.  Physiatrist and rehab team continue to assess barriers to discharge/monitor patient progress toward functional and medical goals  Care Tool:  Bathing    Body parts bathed by patient: Right arm,Left arm,Chest,Abdomen,Front perineal area,Right upper leg,Left upper leg,Face,Left lower leg   Body parts bathed by helper: Buttocks Body parts n/a: Right lower leg   Bathing assist Assist Level: Minimal Assistance - Patient > 75%     Upper Body Dressing/Undressing Upper body dressing   What is the patient wearing?: Pull over shirt    Upper body assist Assist Level: Set up assist    Lower Body Dressing/Undressing Lower body dressing      What is the patient wearing?: Underwear/pull up,Pants     Lower body assist Assist for lower body dressing: Minimal Assistance - Patient > 75%     Toileting Toileting Toileting Activity did not occur Arboriculturist  management and hygiene only): N/A (no void or bm)  Toileting assist Assist for toileting: Minimal Assistance - Patient > 75%     Transfers Chair/bed transfer  Transfers assist     Chair/bed transfer assist level: Supervision/Verbal cueing     Locomotion Ambulation   Ambulation assist      Assist level: 2 helpers Assistive device: Walker-rolling Max distance: 10'   Walk 10 feet activity   Assist  Walk 10 feet activity did not occur: Safety/medical concerns  Assist level: Minimal Assistance - Patient > 75% Assistive device: Walker-rolling   Walk 50 feet activity   Assist Walk 50 feet with 2 turns activity did not occur: Safety/medical concerns         Walk 150  feet activity   Assist Walk 150 feet activity did not occur: Safety/medical concerns         Walk 10 feet on uneven surface  activity   Assist Walk 10 feet on uneven surfaces activity did not occur: Safety/medical concerns         Wheelchair     Assist Will patient use wheelchair at discharge?: Yes Type of Wheelchair: Manual    Wheelchair assist level: Supervision/Verbal cueing Max wheelchair distance: 120'    Wheelchair 50 feet with 2 turns activity    Assist        Assist Level: Supervision/Verbal cueing   Wheelchair 150 feet activity     Assist  Wheelchair 150 feet activity did not occur:  (SOB)   Assist Level: Contact Guard/Touching assist,Minimal Assistance - Patient > 75%    Medical Problem List and Plan: 1.  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  Wound VAC removed 03/24/2021.  Continue CIR  Stump shrinker 2.  Antithrombotics: -DVT/anticoagulation: Eliquis             -antiplatelet therapy: N/A 3. Pain Management: Neurontin 300 mg 3 times daily             Controlled with meds on 5/5  Monitor with increased exertion, particularly for phantom limb pain 4. Anxiety: Continue Lexapro 10 mg daily, Ativan 1 mg nightly             -antipsychotic agents: N/A 5. Neuropsych: This patient is capable of making decisions on his own behalf. 6. Skin/Wound Care: continue shrinker to stump  -Santyl ordered to left great toe -surgery to evaluate 7. Fluids/Electrolytes/Nutrition: Routine in and outs 8.  Acute blood loss anemia.  Continue iron supplement.    Hemoglobin 9.8 on 5/3  Continue to monitor 9.    Blood pressure: d/ced metoprolol.   Controlled on 5/5 10.  Diabetes mellitus with hyperglycemia.  Latest hemoglobin A1c 7.8.  NovoLog 4 units 3 times daily with meals, increased to 6U on 5/4  Lantus insulin 12 units nightly decreased to 8 on 4/27.    Slightly labile on 5/5  Check blood sugars before meals and  at bedtime 11.  GERD.  Continue Protonix 12.  Dyslipidemia: Continue Lipitor 13.  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  Pacemaker removed 12/14/2020 due to staph bacteremia.  Follow-up cardiology services 14.  Idiopathic pulmonary fibrosis.  Continue CPAP 15.  Atrial fibrillation.  Cardiac rate controlled, regular.  Continue Eliquis.             Monitor increase activity 16.  CAD with CABG.  No chest pain or increasing shortness of breath 17.  CKD stage II/III.    Creatinine 1.03 on 5/3  Continue to monitor 18.  Hyponatremia  Sodium 134 on 5/3  Continue to monitor 19. Pulmonary fibrosis   Last infusion on 4/26  Albuterol scheduled (?  Home medication), discontinued by respiratory, reordered  Stable   LOS: 13 days A FACE TO FACE EVALUATION WAS PERFORMED  Chardonnay Holzmann Lorie Phenix 04/08/2021, 11:05 AM

## 2021-04-08 NOTE — Progress Notes (Signed)
Occupational Therapy Session Note  Patient Details  Name: Shane [name] MRN: [id number] Date of Birth: 02/07/1941  Today's Date: 04/08/2021 OT Individual Time: 5087-1994 OT Individual Time Calculation (min): 41 min    Short Term Goals: Week 2:  OT Short Term Goal 1 (Week 2): Patient will complete SPT with RW CS level with RW and appropriate DME OT Short Term Goal 2 (Week 2): patient will complete toileting with CS OT Short Term Goal 3 (Week 2): patient will complete bathing with set up/CS  Skilled Therapeutic Interventions/Progress Updates:    Pt received in room in bed and consented to OT tx. Pt seen for w/c mgmt and mobility and BUE strengthening HEP to increase strength for ADLs and functional transfers and w/c mobility. Pt instructed in 3#db unilateral exercises including elbow flexion, chest press, shoulder press, and shoulder flexion for 3x15 with min cuing for proper tech with good carryover. Pt instructed in w/c mgmt and mobility in small spaces and turns. Pt required frequent rest breaks during session 2/2 fatigue. Pt demo'd ability to manage leg rests ans properly position chair for w/c to bed transfer. After tx, pt left in bed with all needs met, alarm on.  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 Restrictions Weight Bearing Restrictions: Yes RLE Weight Bearing: Weight bearing as tolerated Pain: None  Therapy/Group: Individual Therapy  Ysabella Babiarz 04/08/2021, 12:20 PM

## 2021-04-08 NOTE — Progress Notes (Signed)
Patient ID: Shane [name], male   DOB: 1941/07/25, 80 y.o.   MRN: [id number] Follow up with the patient regarding missing stump shrinker ordered 04/07/21 and wife's concerns noted at team conference. Reviewed toileting with the patient and noted use of bedpan for convenience here at the hospital with small bathroom and scooter to bathroom when home. Feels like staff have addressed concerns and will be prepared. No other concerns noted at present. Continue to follow along to the discharge to address education. Margarito Liner

## 2021-04-09 LAB — GLUCOSE, CAPILLARY
Glucose-Capillary: 90 mg/dL (ref 70–99)
Glucose-Capillary: 134 mg/dL — ABNORMAL HIGH (ref 70–99)
Glucose-Capillary: 122 mg/dL — ABNORMAL HIGH (ref 70–99)
Glucose-Capillary: 132 mg/dL — ABNORMAL HIGH (ref 70–99)

## 2021-04-09 NOTE — Progress Notes (Signed)
Physical Therapy Weekly Progress Note  Patient Details  Name: Shane [name] MRN: [id number] Date of Birth: [birth year]  Beginning of progress report period: April 02, 2021 End of progress report period: Apr 09, 2021  Today's Date: 04/09/2021 PT Individual Time:Session1: 4098-1191; Session2: 4782-9562 PT Individual Time Calculation (min): 28 min & 45 min   Patient has met 3 of 4 short term goals.  Patient achieved all STG's except S for sit to stand to RW, still needing CGA for stability due to balance with RW during transition.  Patient with improved strength and endurance able to ambulate up to 10' with RW and min/CGA.  Also balance with S with 1 UE reach.  Today also completed caregiver education with wife.  Feel pt should continue to progress with safety and independence with transfers and achieve LTG's with gait distance revisions made today.  Patient continues to demonstrate the following deficits muscle weakness, decreased cardiorespiratoy endurance and decreased standing balance and therefore will continue to benefit from skilled PT intervention to increase functional independence with mobility.  Patient progressing toward long term goals. however did downgrade distance for ambulation due to limited endurance with h/o pulmonary fibrosis.  Continue plan of care.  PT Short Term Goals Week 2:  PT Short Term Goal 1 (Week 2): Patient will perform sit to stand with S PT Short Term Goal 1 - Progress (Week 2): Progressing toward goal PT Short Term Goal 2 (Week 2): Patient will demonstrate standing balance with RW and 1 UE support with S for functional reach PT Short Term Goal 2 - Progress (Week 2): Met PT Short Term Goal 3 (Week 2): Patient will ambulate 10' with LRAD and min A. PT Short Term Goal 3 - Progress (Week 2): Met PT Short Term Goal 4 (Week 2): Patient will perform squat pivot bed<>w/c with S. PT Short Term Goal 4 - Progress (Week 2): Met Week 3:  PT Short Term Goal 1 (Week 3):  STG=LTG due to ELOS  Skilled Therapeutic Interventions/Progress Updates:  Session1:  Patient in supine and wife present for caregiver education.  Discussed plan for PT sessions today and focus this session on amputee exercises for HEP.  Patient in supine performed R quad set with hip adductor squeeze & 5 sec hold x 10, single limb bridge on L with R SLR & 5 sec hold x 10, sidelying hip abduction on both sides x 10, discussed prone exercises with wife, but not performed as pt on hospital bed, but gave reason for prone lying for hip extensor strength and flexor length.  Patient performed hamstring stretch with belt on L and towel on R 2 x 20-30 sec hold, then ITband stretches with A using belt on L and towel on R 2 x 20-30 sec hold.  Patient supine to sit with S using HOB elevation and rails.  Seated for LAQ x 10 on each side, educated wife pt using 5# weight on L during therapy and she reports they have one at home.  Discussed sit to stand for therex as well, but OT arriving for session so handoff for continued caregiver education.  Session2:Patient seated in w/c with wife present.  Patient performed w/c mobility to elevators x 120'.  Reports already pushed w/c during OT session so patient transported rest of distance to ortho gym.  Patient performed car transfer to simulated RAV 4 height (28") with CGA and cues for reaching for back of seat, not door.  Wife return demonstrated assisting pt for transfer  with min cues for appropriate level of assistance.  They questioned getting into pt's truck which is much higher with drop down step and he also used a small stool to get up to that step.  Educated likely not safe to attempt unless have the ability to stand to first step at least.  Did discuss pt getting son to build a ramp to roll up w/c to height of truck seat.  They also questioned daughter's vehicle which is large SUV he used the small step to get up to.  Patient performed sit to stand with foot on 4" step  from w/c with min A and pivot to sit on higher seat simulating daughter's SUV.  Pt and wife report if he is going with his daughter would have assist for this.  Patient sit to stand to RW CGA and ambulated 10' with CGA to sit on mat.  Educated wife pt plans to do some at home to continue to have ability to hop to ease transition when he gets a prosthetic.  Wife assisted appropriately for ambulation back to w/c with pt fatiguing and sitting in chair prior to getting backed all the way and reaching for chair.  Discussed with pt/wife pt needs to communicate if fatigued and needs chair closer.  Patient in w/c assisted back to room.  Squat pivot to bed with S.  Sit to supine with S.  Left with wife in the room and needs met, bed alarm active.  Ambulation/gait training;Community reintegration;DME/adaptive equipment instruction;Neuromuscular re-education;Psychosocial support;UE/LE Strength taining/ROM;Wheelchair propulsion/positioning;Balance/vestibular training;Discharge planning;Functional electrical stimulation;Pain management;Skin care/wound management;Therapeutic Activities;UE/LE Coordination activities;Cognitive remediation/compensation;Disease management/prevention;Functional mobility training;Patient/family education;Splinting/orthotics;Therapeutic Exercise;Visual/perceptual remediation/compensation   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

## 2021-04-09 NOTE — Plan of Care (Signed)
  Problem: RH Ambulation Goal: LTG Patient will ambulate in controlled environment (PT) Description: LTG: Patient will ambulate in a controlled environment, # of feet with assistance (PT). Flowsheets (Taken 04/09/2021 1157) LTG: Pt will ambulate in controlled environ  assist needed:: (downgraded due to limited endurance with pumonary fibrosis) Contact Guard/Touching assist LTG: Ambulation distance in controlled environment: 81' with LRAD Goal: LTG Patient will ambulate in home environment (PT) Description: LTG: Patient will ambulate in home environment, # of feet with assistance (PT). Flowsheets (Taken 04/09/2021 1157) LTG: Pt will ambulate in home environ  assist needed:: Contact Guard/Touching assist LTG: Ambulation distance in home environment: (downgraded due to limited endurance with pumonary fibrosis) 12' with LRAD  Magda Kiel, PT

## 2021-04-09 NOTE — Progress Notes (Signed)
PROGRESS NOTE   Subjective/Complaints: Patient seen sitting laying in bed this AM.  He states he slept well overnight.  He denies complaints.  ROS: Denies CP, SOB, N/V/D  Objective:   No results found. No results for input(s): WBC, HGB, HCT, PLT in the last 72 hours. No results for input(s): NA, K, CL, CO2, GLUCOSE, BUN, CREATININE, CALCIUM in the last 72 hours.  Intake/Output Summary (Last 24 hours) at 04/09/2021 0950 Last data filed at 04/09/2021 0837 Gross per 24 hour  Intake 820 ml  Output 800 ml  Net 20 ml     Pressure Injury 03/26/21 Toe (Comment  which one) Anterior;Left Unstageable - Full thickness tissue loss in which the base of the injury is covered by slough (yellow, tan, gray, green or brown) and/or eschar (tan, brown or black) in the wound bed. pressu (Active)  03/26/21 1423  Location: Toe (Comment  which one)  Location Orientation: Anterior;Left  Staging: Unstageable - Full thickness tissue loss in which the base of the injury is covered by slough (yellow, tan, gray, green or brown) and/or eschar (tan, brown or black) in the wound bed.  Wound Description (Comments): pressure injury on medial aspect of great toe knuckle  Present on Admission: Yes    Physical Exam: Vital Signs Blood pressure 125/60, pulse 62, temperature 98.2 F (36.8 C), temperature source Oral, resp. rate 14, height 6' (1.829 m), weight 103.7 kg, SpO2 100 %.  Constitutional: No distress . Vital signs reviewed. Morbidly obese.  HENT: Normocephalic.  Atraumatic. Eyes: EOMI. No discharge. Cardiovascular: No JVD.  Irregularly irregular.  Respiratory: Normal effort.  No stridor.  Bilateral clear to auscultation. GI: Non-distended.  BS +. Skin: Warm and dry.   Right BKA with dressing CDI Left toe with dressing CDI Psych: Normal mood.  Normal behavior. Musc: Left BKA with improving edema and tenderness Neuro: Alert Motor: Grossly 5/5  throughout, unchanged  Assessment/Plan: 1. Functional deficits which require 3+ hours per day of interdisciplinary therapy in a comprehensive inpatient rehab setting.  Physiatrist is providing close team supervision and 24 hour management of active medical problems listed below.  Physiatrist and rehab team continue to assess barriers to discharge/monitor patient progress toward functional and medical goals  Care Tool:  Bathing    Body parts bathed by patient: Right arm,Left arm,Chest,Abdomen,Front perineal area,Right upper leg,Left upper leg,Face,Left lower leg   Body parts bathed by helper: Buttocks Body parts n/a: Right lower leg   Bathing assist Assist Level: Minimal Assistance - Patient > 75%     Upper Body Dressing/Undressing Upper body dressing   What is the patient wearing?: Pull over shirt    Upper body assist Assist Level: Set up assist    Lower Body Dressing/Undressing Lower body dressing      What is the patient wearing?: Underwear/pull up,Pants     Lower body assist Assist for lower body dressing: Minimal Assistance - Patient > 75%     Toileting Toileting Toileting Activity did not occur (Clothing management and hygiene only): N/A (no void or bm)  Toileting assist Assist for toileting: Minimal Assistance - Patient > 75%     Transfers Chair/bed transfer  Transfers assist  Chair/bed transfer assist level: Supervision/Verbal cueing     Locomotion Ambulation   Ambulation assist      Assist level: Contact Guard/Touching assist Assistive device: Walker-rolling Max distance: 10'   Walk 10 feet activity   Assist  Walk 10 feet activity did not occur: Safety/medical concerns  Assist level: Contact Guard/Touching assist Assistive device: Walker-rolling   Walk 50 feet activity   Assist Walk 50 feet with 2 turns activity did not occur: Safety/medical concerns         Walk 150 feet activity   Assist Walk 150 feet activity did not  occur: Safety/medical concerns         Walk 10 feet on uneven surface  activity   Assist Walk 10 feet on uneven surfaces activity did not occur: Safety/medical concerns         Wheelchair     Assist Will patient use wheelchair at discharge?: Yes Type of Wheelchair: Manual    Wheelchair assist level: Supervision/Verbal cueing Max wheelchair distance: 120'    Wheelchair 50 feet with 2 turns activity    Assist        Assist Level: Supervision/Verbal cueing   Wheelchair 150 feet activity     Assist  Wheelchair 150 feet activity did not occur:  (SOB)   Assist Level: Contact Guard/Touching assist,Minimal Assistance - Patient > 75%    Medical Problem List and Plan: 1.  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  Wound VAC removed 03/24/2021.  Continue CIR  Stump shrinker 2.  Antithrombotics: -DVT/anticoagulation: Eliquis             -antiplatelet therapy: N/A 3. Pain Management: Neurontin 300 mg 3 times daily             Controlled with meds on 5/6  Monitor with increased exertion, particularly for phantom limb pain 4. Anxiety: Continue Lexapro 10 mg daily, Ativan 1 mg nightly             -antipsychotic agents: N/A 5. Neuropsych: This patient is capable of making decisions on his own behalf. 6. Skin/Wound Care: continue shrinker to stump  -Santyl ordered to left great toe -surgery to evaluate 7. Fluids/Electrolytes/Nutrition: Routine in and outs 8.  Acute blood loss anemia.  Continue iron supplement.    Hemoglobin 9.8 on 5/3  Continue to monitor 9.    Blood pressure: d/ced metoprolol.   Controlled on 5/6 10.  Diabetes mellitus with hyperglycemia.  Latest hemoglobin A1c 7.8.  NovoLog 4 units 3 times daily with meals, increased to 6U on 5/4  Lantus insulin 12 units nightly decreased to 8 on 4/27.    Slightly labile on 5/6, but appears to be improving overall  Check blood sugars before meals and at bedtime 11.   GERD.  Continue Protonix 12.  Dyslipidemia: Continue Lipitor 13.  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  Pacemaker removed 12/14/2020 due to staph bacteremia.  Follow-up cardiology services 14.  Idiopathic pulmonary fibrosis.  Continue CPAP 15.  Atrial fibrillation.  Cardiac rate controlled, regular.  Continue Eliquis.             Monitor increase activity 16.  CAD with CABG.  No chest pain or increasing shortness of breath 17.  CKD stage II/III.    Creatinine 1.03 on 5/3  Continue to monitor 18.  Hyponatremia  Sodium 134 on 5/3  Continue to monitor 19. Pulmonary fibrosis   Last infusion on 4/26  Albuterol scheduled (?  Home  medication), discontinued by respiratory, reordered  Stable   LOS: 14 days A FACE TO FACE EVALUATION WAS PERFORMED  Aeva Posey Lorie Phenix 04/09/2021, 9:50 AM

## 2021-04-09 NOTE — Progress Notes (Signed)
Occupational Therapy Session Note  Patient Details  Name: Shane [name] MRN: [id number] Date of Birth: [birth year]  Today's Date: 04/09/2021 OT Individual Time: 3128-1188 OT Individual Time Calculation (min): 28 min    Short Term Goals: Week 1:  OT Short Term Goal 1 (Week 1): With use of AE, pt will complete LB dressing with min assist OT Short Term Goal 1 - Progress (Week 1): Met OT Short Term Goal 2 (Week 1): Pt will complete toileting hygiene and clothing management with min assist OT Short Term Goal 2 - Progress (Week 1): Progressing toward goal OT Short Term Goal 3 (Week 1): Pt demonstrate dynamic standing balance with CGA during functional ADL/IADL task. OT Short Term Goal 3 - Progress (Week 1): Met Week 2:  OT Short Term Goal 1 (Week 2): Patient will complete SPT with RW CS level with RW and appropriate DME OT Short Term Goal 2 (Week 2): patient will complete toileting with CS OT Short Term Goal 3 (Week 2): patient will complete bathing with set up/CS  Skilled Therapeutic Interventions/Progress Updates:    Pt received in bed and consented to OT tx. Pt requested to work on standing tolerance this session. Instructed in STS from lower surface for increased functional transfer independence and RLE strength.Trained in functional activity at raised table top level to increase standing balance and tolerance for ADLs and IADLs with close SUP for safety. Pt required to have 1 hand on RW to maintain good balance throughout tasks, required multiple rest breaks due to fatigue. Pt educated and issued handout on proper positioning and pain management for limb loss, reports he has no concerns to go home next week, and that "things will be easier at home." After tx, pt left in bed with alarm on and with all needs met.   Therapy Documentation Precautions:  Precautions Precautions: Fall,Other (comment) Precaution Comments: monitor BP for orthostatic hypotension, SpO2 due pulmonary hx, chronic wound  on L toe Restrictions Weight Bearing Restrictions: Yes RLE Weight Bearing: Weight bearing as tolerated   Vital Signs: Therapy Vitals Temp: 97.9 F (36.6 C) Temp Source: Oral Pulse Rate: 63 Resp: 18 BP: 111/60 Patient Position (if appropriate): Lying Oxygen Therapy SpO2: 97 % O2 Device: Room Air Pain: Pain Assessment Pain Score: 0-No pain   Therapy/Group: Individual Therapy  Kieth Hartis 04/09/2021, 3:43 PM

## 2021-04-09 NOTE — Progress Notes (Signed)
Occupational Therapy Session Note  Patient Details  Name: Shane [name] MRN: [id number] Date of Birth: 1941/07/04  Today's Date: 04/09/2021 OT Individual Time: 1000-1100 OT Individual Time Calculation (min): 60 min    Short Term Goals: Week 2:  OT Short Term Goal 1 (Week 2): Patient will complete SPT with RW CS level with RW and appropriate DME OT Short Term Goal 2 (Week 2): patient will complete toileting with CS OT Short Term Goal 3 (Week 2): patient will complete bathing with set up/CS  Skilled Therapeutic Interventions/Progress Updates:    pt and wife present for family education on functional transfers, safety awareness, fall prevention, steadying A, dressing techniques, orthosist application, shoe donning with stool, tub/BSC transfers, stand pivot with RW and squat pivot with no AD from w/c, w/c parts management, gait belt placement, UB HEP, energy consernation and safe reach back prior to sitting. Pt and wife practice multiple stand pivot transfers to/from w/c, simulated BSC and TTB trasnfers via squat pivot. Pts wife biggest fear is pt falling and educated on what to do if pt fell, controlled fall and safety mearures to prevent fall. Exited session with pt seated in w/c with wife present and family in room. Pt verblized knowing not to get up from w/c.   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

## 2021-04-10 LAB — GLUCOSE, CAPILLARY
Glucose-Capillary: 126 mg/dL — ABNORMAL HIGH (ref 70–99)
Glucose-Capillary: 141 mg/dL — ABNORMAL HIGH (ref 70–99)
Glucose-Capillary: 169 mg/dL — ABNORMAL HIGH (ref 70–99)

## 2021-04-10 MED ORDER — HYDROCERIN EX CREA
TOPICAL_CREAM | Freq: Two times a day (BID) | CUTANEOUS | Status: DC
  Administered 2021-04-11: 1 via TOPICAL
  Filled 2021-04-10: qty 113

## 2021-04-10 NOTE — Progress Notes (Signed)
PROGRESS NOTE   Subjective/Complaints:  Pt reports using nebs for pulmonary fibrosis- working pretty well- O2 doesn't help much.  Concerned about buttock wounds- has had for awhile, but not getting the wound care he expected.  Has LLE healing shoe for big toe - being addressed Asking about how R BKA looks.  Most of pain is at night- thinks phantom pain- cannot tell where it's coming from.  Decreasing in amount.  .  ROS:  Pt denies SOB, abd pain, CP, N/V/C/D, and vision changes   Objective:   No results found. No results for input(s): WBC, HGB, HCT, PLT in the last 72 hours. No results for input(s): NA, K, CL, CO2, GLUCOSE, BUN, CREATININE, CALCIUM in the last 72 hours.  Intake/Output Summary (Last 24 hours) at 04/10/2021 1410 Last data filed at 04/10/2021 1304 Gross per 24 hour  Intake 820 ml  Output 1875 ml  Net -1055 ml     Pressure Injury 03/26/21 Toe (Comment  which one) Anterior;Left Unstageable - Full thickness tissue loss in which the base of the injury is covered by slough (yellow, tan, gray, green or brown) and/or eschar (tan, brown or black) in the wound bed. pressu (Active)  03/26/21 1423  Location: Toe (Comment  which one)  Location Orientation: Anterior;Left  Staging: Unstageable - Full thickness tissue loss in which the base of the injury is covered by slough (yellow, tan, gray, green or brown) and/or eschar (tan, brown or black) in the wound bed.  Wound Description (Comments): pressure injury on medial aspect of great toe knuckle  Present on Admission: Yes    Physical Exam: Vital Signs Blood pressure 129/60, pulse 62, temperature 98 F (36.7 C), temperature source Oral, resp. rate 14, height 6' (1.829 m), weight 103.7 kg, SpO2 99 %.     General: awake, alert, appropriate, sitting up in bedside chair; interactive, NAD HENT: conjugate gaze; oropharynx moist CV: regular rate; no JVD Pulmonary: decreased  throughout, but no W/R/R- no O2 GI: soft, NT, ND, (+)BS Psychiatric: interactive, appropriate Neurological: Ox3 . Skin: Warm and dry.  R BKA has staples intact; healing great, but skin very dry and flaking around the base of BKA; L healing shoe in place; cannot assess buttocks because in chair.  Left toe with dressing CDI Musc: Left BKA with improving edema and tenderness Motor: Grossly 5/5 throughout, unchanged  Assessment/Plan: 1. Functional deficits which require 3+ hours per day of interdisciplinary therapy in a comprehensive inpatient rehab setting.  Physiatrist is providing close team supervision and 24 hour management of active medical problems listed below.  Physiatrist and rehab team continue to assess barriers to discharge/monitor patient progress toward functional and medical goals  Care Tool:  Bathing    Body parts bathed by patient: Right arm,Left arm,Chest,Abdomen,Front perineal area,Right upper leg,Left upper leg,Face,Left lower leg   Body parts bathed by helper: Buttocks Body parts n/a: Right lower leg   Bathing assist Assist Level: Minimal Assistance - Patient > 75%     Upper Body Dressing/Undressing Upper body dressing   What is the patient wearing?: Pull over shirt    Upper body assist Assist Level: Set up assist    Lower Body  Dressing/Undressing Lower body dressing      What is the patient wearing?: Underwear/pull up,Pants     Lower body assist Assist for lower body dressing: Minimal Assistance - Patient > 75%     Toileting Toileting Toileting Activity did not occur (Clothing management and hygiene only): N/A (no void or bm)  Toileting assist Assist for toileting: Minimal Assistance - Patient > 75%     Transfers Chair/bed transfer  Transfers assist     Chair/bed transfer assist level: Contact Guard/Touching assist     Locomotion Ambulation   Ambulation assist      Assist level: Contact Guard/Touching assist Assistive device:  Walker-rolling Max distance: 10'   Walk 10 feet activity   Assist  Walk 10 feet activity did not occur: Safety/medical concerns  Assist level: Contact Guard/Touching assist Assistive device: Walker-rolling   Walk 50 feet activity   Assist Walk 50 feet with 2 turns activity did not occur: Safety/medical concerns         Walk 150 feet activity   Assist Walk 150 feet activity did not occur: Safety/medical concerns         Walk 10 feet on uneven surface  activity   Assist Walk 10 feet on uneven surfaces activity did not occur: Safety/medical concerns         Wheelchair     Assist Will patient use wheelchair at discharge?: Yes Type of Wheelchair: Manual    Wheelchair assist level: Supervision/Verbal cueing Max wheelchair distance: 120'    Wheelchair 50 feet with 2 turns activity    Assist        Assist Level: Supervision/Verbal cueing   Wheelchair 150 feet activity     Assist  Wheelchair 150 feet activity did not occur:  (SOB)   Assist Level: Contact Guard/Touching assist,Minimal Assistance - Patient > 75%    Medical Problem List and Plan: 1.  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  Wound VAC removed 03/24/2021.  con't PT and OT- con't R BKA limb guard and L healing shoe and residual limb shrinker 2.  Antithrombotics: -DVT/anticoagulation: Eliquis             -antiplatelet therapy: N/A 3. Pain Management: Neurontin 300 mg 3 times daily             Controlled with meds on 5/6  5/7- pt reports some phantom pain at night- is improving- con't meds  Monitor with increased exertion, particularly for phantom limb pain 4. Anxiety: Continue Lexapro 10 mg daily, Ativan 1 mg nightly             -antipsychotic agents: N/A 5. Neuropsych: This patient is capable of making decisions on his own behalf. 6. Skin/Wound Care: continue shrinker to stump  -Santyl ordered to left great toe -surgery to  evaluate  5/7- will ask nursing to assess buttocks; also wash R BKA with wet washcloth to get dry skin off- gently and then use Eucerin BID 7. Fluids/Electrolytes/Nutrition: Routine in and outs 8.  Acute blood loss anemia.  Continue iron supplement.    Hemoglobin 9.8 on 5/3  Continue to monitor 9.    Blood pressure: d/ced metoprolol.   Controlled on 5/6 10.  Diabetes mellitus with hyperglycemia.  Latest hemoglobin A1c 7.8.  NovoLog 4 units 3 times daily with meals, increased to 6U on 5/4  Lantus insulin 12 units nightly decreased to 8 on 4/27.    Slightly labile on 5/6, but appears to be improving overall  5/7- BGs 120s-130s- con't regimen  Check blood sugars before meals and at bedtime 11.  GERD.  Continue Protonix 12.  Dyslipidemia: Continue Lipitor 13.  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  Pacemaker removed 12/14/2020 due to staph bacteremia.  Follow-up cardiology services 14.  Idiopathic pulmonary fibrosis.  Continue CPAP 15.  Atrial fibrillation.  Cardiac rate controlled, regular.  Continue Eliquis.             Monitor increase activity 16.  CAD with CABG.  No chest pain or increasing shortness of breath 17.  CKD stage II/III.    Creatinine 1.03 on 5/3  Continue to monitor 18.  Hyponatremia  Sodium 134 on 5/3  Continue to monitor 19. Pulmonary fibrosis   Last infusion on 4/26  Albuterol scheduled (?  Home medication), discontinued by respiratory, reordered  Stable  5/7- pt likes prn nebs- really needs- will con't- no O2 at this time.    LOS: 15 days A FACE TO FACE EVALUATION WAS PERFORMED  Shane [name] 04/10/2021, 2:10 PM

## 2021-04-10 NOTE — Progress Notes (Signed)
Physical Therapy Session Note  Patient Details  Name: Shane [name] MRN: [id number] Date of Birth: [birth year]  Today's Date: 04/10/2021 PT Individual Time: 0800-0900 PT Individual Time Calculation (min): 60 min   Short Term Goals: Week 2:  PT Short Term Goal 1 (Week 2): Patient will perform sit to stand with S PT Short Term Goal 1 - Progress (Week 2): Progressing toward goal PT Short Term Goal 2 (Week 2): Patient will demonstrate standing balance with RW and 1 UE support with S for functional reach PT Short Term Goal 2 - Progress (Week 2): Met PT Short Term Goal 3 (Week 2): Patient will ambulate 10' with LRAD and min A. PT Short Term Goal 3 - Progress (Week 2): Met PT Short Term Goal 4 (Week 2): Patient will perform squat pivot bed<>w/c with S. PT Short Term Goal 4 - Progress (Week 2): Met  Skilled Therapeutic Interventions/Progress Updates:  Chart reviewed. Pt received supine in bed and agreeable to therapy. Pt noted to have PF, and thus was monitored for SOB t/o session. Pt transferred to EOB with CGA in hospital bed. Pt required set up assist to begin donning shorts, followed by CGA + RW for STS to finish lower body dressing. Pt transferred to [facility] with CGA + RW and then completed 46f WC propulsion with supervision. In rehab gym, pt completed amb of 144fwith CGA + RW x2 trials. Pt then competed seated ball squeeze x10 reps. Pt amb 1241fith MinA + RW to transfer to mat. Once seated, pt transferred sit>sup with MinA on mat table. In supine, pt completed quad set, SLR with 5# weight, and SLR hip bridge all 2x10 reps. [facility]>sit from mat table and CGA +RW for SPT. At end of session, pt was left seated in [place] alarm engaged and nurse call bell and all needs in reach.  Therapy Documentation Precautions:  Precautions Precautions: Fall,Other (comment) Precaution Comments: monitor BP for orthostatic hypotension, SpO2 due pulmonary hx, chronic wound on L  toe Restrictions Weight Bearing Restrictions: Yes RLE Weight Bearing: Non weight bearing   Vital Signs: Therapy Vitals Temp: 97.8 F (36.6 C) Temp Source: Oral Pulse Rate: 63 Resp: 20 BP: 123/60 Patient Position (if appropriate): Lying Oxygen Therapy SpO2: 98 % O2 Device: Room Air Pain:  0- no pain    Therapy/Group: Individual Therapy  KirMarquette SaaT, DPT 04/10/2021, 2:29 PM

## 2021-04-11 MED ORDER — ZINC OXIDE 12.8 % EX OINT
1.0000 "application " | TOPICAL_OINTMENT | Freq: Two times a day (BID) | CUTANEOUS | Status: DC
  Administered 2021-04-11 – 2021-04-13 (×5): 1 via TOPICAL
  Filled 2021-04-11: qty 56.7

## 2021-04-11 NOTE — Progress Notes (Signed)
Occupational Therapy Session Note  Patient Details  Name: Shane [name] MRN: [id number] Date of Birth: 17-Apr-1941  Today's Date: 04/11/2021 OT Individual Time: 8469-6295 OT Individual Time Calculation (min): 57 min    Short Term Goals: Week 2:  OT Short Term Goal 1 (Week 2): Patient will complete SPT with RW CS level with RW and appropriate DME OT Short Term Goal 2 (Week 2): patient will complete toileting with CS OT Short Term Goal 3 (Week 2): patient will complete bathing with set up/CS  Skilled Therapeutic Interventions/Progress Updates:    Patient in bed, alert and ready for therapy session.  He requests a shower this session and denies pain.  He notes that left toe is improving and no more incidence of bleeding at right incision.  Left toe and residual limb covered for shower.  Supine to sitting edge of bed mod I.  SPT with RW bed to w/c CGA.  Sit pivot transfer w/c to/from shower bench with CS.  Able to doff clothing in shower using grab bars with CS.  Bathing completed with long handles sponge, hand held shower mod I seated on bench - CGA when in stance to wash buttocks .  Dressing completed seated on shower bench - OH shirt set up, underwear/pants set up over legs, clothing management in stance CGA.  Grooming tasks w/c level mod I.  He is able to doff/donn shrinker and amp shield with set up.  SPT to bed with CGA - completed standing balance activities with CS.  Reviewed energy conservation and safety recommendations for home with good carryover and understanding demonstrated.  He returned to supine position in bed mod I.  Bed alarm set and call bell in hand.    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

## 2021-04-11 NOTE — Progress Notes (Signed)
CBG did not transfer:  PM CBG: 133 AM CBG: 110

## 2021-04-11 NOTE — Progress Notes (Signed)
PROGRESS NOTE   Subjective/Complaints:  Pt reports no pain- thinks R BKA and L toe looking good to him.  Asking about treatment for buttocks that are sore if in wrong position.   ROS:   Pt denies SOB, abd pain, CP, N/V/C/D, and vision changes  Objective:   No results found. No results for input(s): WBC, HGB, HCT, PLT in the last 72 hours. No results for input(s): NA, K, CL, CO2, GLUCOSE, BUN, CREATININE, CALCIUM in the last 72 hours.  Intake/Output Summary (Last 24 hours) at 04/11/2021 0949 Last data filed at 04/11/2021 0741 Gross per 24 hour  Intake 960 ml  Output 500 ml  Net 460 ml     Pressure Injury 03/26/21 Toe (Comment  which one) Anterior;Left Unstageable - Full thickness tissue loss in which the base of the injury is covered by slough (yellow, tan, gray, green or brown) and/or eschar (tan, brown or black) in the wound bed. pressu (Active)  03/26/21 1423  Location: Toe (Comment  which one)  Location Orientation: Anterior;Left  Staging: Unstageable - Full thickness tissue loss in which the base of the injury is covered by slough (yellow, tan, gray, green or brown) and/or eschar (tan, brown or black) in the wound bed.  Wound Description (Comments): pressure injury on medial aspect of great toe knuckle  Present on Admission: Yes    Physical Exam: Vital Signs Blood pressure 135/63, pulse 63, temperature 98.6 F (37 C), temperature source Oral, resp. rate 18, height 6' (1.829 m), weight 103.7 kg, SpO2 99 %.      General: awake, alert, appropriate, sitting up in bed;  NAD HENT: conjugate gaze; oropharynx moist CV: regular rate; no JVD Pulmonary: CTA B/L; no W/R/R- good air movement GI: soft, NT, ND, (+)BS Psychiatric: appropriate Neurological: Ox3 Skin: Warm and dry.  R BKA has staples intact; healing great, but skin very dry and flaking around the base of BKA- slightly better today; L healing shoe in place;  buttocks has some excoriation between crease of upper part of buttocks- slightly open- but just slightly pink- no drainage seen.   Left toe with dressing CDI Musc: Left BKA with improving edema and tenderness Motor: Grossly 5/5 throughout, unchanged  Assessment/Plan: 1. Functional deficits which require 3+ hours per day of interdisciplinary therapy in a comprehensive inpatient rehab setting.  Physiatrist is providing close team supervision and 24 hour management of active medical problems listed below.  Physiatrist and rehab team continue to assess barriers to discharge/monitor patient progress toward functional and medical goals  Care Tool:  Bathing    Body parts bathed by patient: Right arm,Left arm,Chest,Abdomen,Front perineal area,Right upper leg,Left upper leg,Face,Left lower leg   Body parts bathed by helper: Buttocks Body parts n/a: Right lower leg   Bathing assist Assist Level: Minimal Assistance - Patient > 75%     Upper Body Dressing/Undressing Upper body dressing   What is the patient wearing?: Pull over shirt    Upper body assist Assist Level: Set up assist    Lower Body Dressing/Undressing Lower body dressing      What is the patient wearing?: Underwear/pull up,Pants     Lower body assist Assist for lower  body dressing: Minimal Assistance - Patient > 75%     Toileting Toileting Toileting Activity did not occur Landscape architect and hygiene only): N/A (no void or bm)  Toileting assist Assist for toileting: Minimal Assistance - Patient > 75%     Transfers Chair/bed transfer  Transfers assist     Chair/bed transfer assist level: Contact Guard/Touching assist     Locomotion Ambulation   Ambulation assist      Assist level: Minimal Assistance - Patient > 75% Assistive device: Walker-rolling Max distance: 12   Walk 10 feet activity   Assist  Walk 10 feet activity did not occur: Safety/medical concerns  Assist level: Contact  Guard/Touching assist Assistive device: Walker-rolling   Walk 50 feet activity   Assist Walk 50 feet with 2 turns activity did not occur: Safety/medical concerns         Walk 150 feet activity   Assist Walk 150 feet activity did not occur: Safety/medical concerns         Walk 10 feet on uneven surface  activity   Assist Walk 10 feet on uneven surfaces activity did not occur: Safety/medical concerns         Wheelchair     Assist Will patient use wheelchair at discharge?: Yes Type of Wheelchair: Manual    Wheelchair assist level: Supervision/Verbal cueing Max wheelchair distance: 120'    Wheelchair 50 feet with 2 turns activity    Assist        Assist Level: Supervision/Verbal cueing   Wheelchair 150 feet activity     Assist  Wheelchair 150 feet activity did not occur:  (SOB)   Assist Level: Contact Guard/Touching assist,Minimal Assistance - Patient > 75%    Medical Problem List and Plan: 1.  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  Wound VAC removed 03/24/2021.  con't PT and OT- with shrinker and R limb guard and L healing shoe 2.  Antithrombotics: -DVT/anticoagulation: Eliquis             -antiplatelet therapy: N/A 3. Pain Management: Neurontin 300 mg 3 times daily             Controlled with meds on 5/6  5/7- pt reports some phantom pain at night- is improving- con't meds  5/8- denies pain this AM- con't regimen  Monitor with increased exertion, particularly for phantom limb pain 4. Anxiety: Continue Lexapro 10 mg daily, Ativan 1 mg nightly             -antipsychotic agents: N/A 5. Neuropsych: This patient is capable of making decisions on his own behalf. 6. Skin/Wound Care: continue shrinker to stump  -Santyl ordered to left great toe -surgery to evaluate  5/7- will ask nursing to assess buttocks; also wash R BKA with wet washcloth to get dry skin off- gently and then use Eucerin BID  5/8-  added Zinc paste BID for buttocks due to excoriation 7. Fluids/Electrolytes/Nutrition: Routine in and outs 8.  Acute blood loss anemia.  Continue iron supplement.    Hemoglobin 9.8 on 5/3  Continue to monitor 9.    Blood pressure: d/ced metoprolol.   Controlled on 5/6 10.  Diabetes mellitus with hyperglycemia.  Latest hemoglobin A1c 7.8.  NovoLog 4 units 3 times daily with meals, increased to 6U on 5/4  Lantus insulin 12 units nightly decreased to 8 on 4/27.    Slightly labile on 5/6, but appears to be improving overall  5/8- BGs well controlled- con't regimen  Check blood sugars before meals and at bedtime 11.  GERD.  Continue Protonix 12.  Dyslipidemia: Continue Lipitor 13.  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  Pacemaker removed 12/14/2020 due to staph bacteremia.  Follow-up cardiology services 14.  Idiopathic pulmonary fibrosis.  Continue CPAP 15.  Atrial fibrillation.  Cardiac rate controlled, regular.  Continue Eliquis.             Monitor increase activity 16.  CAD with CABG.  No chest pain or increasing shortness of breath 17.  CKD stage II/III.    Creatinine 1.03 on 5/3  Continue to monitor 18.  Hyponatremia  Sodium 134 on 5/3  Continue to monitor 19. Pulmonary fibrosis   Last infusion on 4/26  Albuterol scheduled (?  Home medication), discontinued by respiratory, reordered  Stable  5/7- pt likes prn nebs- really needs- will con't- no O2 at this time.    LOS: 16 days A FACE TO FACE EVALUATION WAS PERFORMED  Chrisha Vogel 04/11/2021, 9:49 AM

## 2021-04-11 NOTE — Discharge Summary (Addendum)
Physician Discharge Summary  Patient ID: Wayman Hoard MRN: [id number] DOB/AGE: [birth year] 80 y.o.  Admit date: 03/26/2021 Discharge date: 04/13/2021  Discharge Diagnoses:  Principal Problem:   Right below-knee amputee [facility]) Active Problems:   Hyponatremia   Labile blood glucose   Pulmonary fibrosis (HCC)   CKD (chronic kidney disease), stage II   Post-operative pain Anxiety Acute blood loss anemia Hypertension GERD Hyperlipidemia AV blocks Mobitz 1 Atrial fibrillation Idiopathic pulmonary fibrosis CAD with CABG  Discharged Condition: Stable  Significant Diagnostic Studies: DG CHEST PORT 1 VIEW  Result Date: 03/24/2021 CLINICAL DATA:  Shortness of breath EXAM: PORTABLE CHEST 1 VIEW COMPARISON:  12/16/2020 FINDINGS: Cardiac shadow is enlarged but stable. Postsurgical changes are noted. Diffuse vascular congestion is again noted likely a portion of which is chronic in nature. Diffuse fibrotic changes are noted worse on the left than the right. No acute bony abnormality is seen. Postsurgical changes in the cervical spine are noted. IMPRESSION: Chronic appearing vascular congestion and fibrotic changes similar to that seen on the prior study. No acute abnormality noted. Electronically Signed   By: Inez Catalina M.D.   On: 03/24/2021 18:49   ECHOCARDIOGRAM COMPLETE  Result Date: 03/19/2021    ECHOCARDIOGRAM REPORT   Patient Name:   SHIHEEM CORPORAN Date of Exam: 03/19/2021 Medical Rec #:  [id number]        Height:       72.0 in Accession #:    [id number]       Weight:       235.4 lb Date of Birth:  1941/04/29        BSA:          2.284 m Patient Age:    80 years         BP:           101/66 mmHg Patient Gender: M                HR:           54 bpm. Exam Location:  Inpatient Procedure: 2D Echo, Cardiac Doppler, Color Doppler and Intracardiac            Opacification Agent Indications:    Atrial fibrillation  History:        Patient has prior history of Echocardiogram examinations, most                  recent 12/11/2020. CAD, Prior CABG, Aortic Valve Disease; Risk                 Factors:Sleep Apnea, Hypertension and Diabetes. S/p pacemaker                 removal. CKD. Second degree AV block.  Sonographer:    Clayton Lefort RDCS (AE) Referring Phys: [place]  1. Left ventricular ejection fraction, by estimation, is 50 to 55%. The left ventricle has low normal function. The left ventricle has no regional wall motion abnormalities. There is moderate concentric left ventricular hypertrophy. Left ventricular diastolic function could not be evaluated.  2. Right ventricular systolic function is moderately reduced. The right ventricular size is mildly enlarged. There is moderately elevated pulmonary artery systolic pressure.  3. Left atrial size was mild to moderately dilated.  4. Right atrial size was moderately dilated.  5. The mitral valve is normal in structure. Mild mitral valve regurgitation. Moderate mitral annular calcification.  6. The aortic valve is calcified. There is moderate calcification of the  aortic valve. There is moderate thickening of the aortic valve. Aortic valve regurgitation is not visualized. Mild to moderate aortic valve stenosis. Aortic valve mean gradient measures 20.5 mmHg.  7. Aortic dilatation noted. There is mild dilatation of the ascending aorta, measuring 41 mm.  8. The inferior vena cava is dilated in size with <50% respiratory variability, suggesting right atrial pressure of 15 mmHg. Comparison(s): No significant change from prior study. Compared to prior TEE 12/2020. FINDINGS  Left Ventricle: Left ventricular ejection fraction, by estimation, is 50 to 55%. The left ventricle has low normal function. The left ventricle has no regional wall motion abnormalities. Definity contrast agent was given IV to delineate the left ventricular endocardial borders. The left ventricular internal cavity size was normal in size. There is moderate concentric left  ventricular hypertrophy. Abnormal (paradoxical) septal motion consistent with post-operative status. Left ventricular diastolic function could not be evaluated due to atrial fibrillation. Left ventricular diastolic function could not be evaluated. Right Ventricle: The right ventricular size is mildly enlarged. Right vetricular wall thickness was not well visualized. Right ventricular systolic function is moderately reduced. There is moderately elevated pulmonary artery systolic pressure. The tricuspid regurgitant velocity is 3.18 m/s, and with an assumed right atrial pressure of 15 mmHg, the estimated right ventricular systolic pressure is 44.0 mmHg. Left Atrium: Left atrial size was mild to moderately dilated. Right Atrium: Right atrial size was moderately dilated. Pericardium: There is no evidence of pericardial effusion. Mitral Valve: The mitral valve is normal in structure. Moderate mitral annular calcification. Mild mitral valve regurgitation. Tricuspid Valve: The tricuspid valve is normal in structure. Tricuspid valve regurgitation is trivial. No evidence of tricuspid stenosis. Aortic Valve: The aortic valve is calcified. There is moderate calcification of the aortic valve. There is moderate thickening of the aortic valve. Aortic valve regurgitation is not visualized. Mild to moderate aortic stenosis is present. Aortic valve mean gradient measures 20.5 mmHg. Aortic valve peak gradient measures 36.4 mmHg. Aortic valve area, by VTI measures 0.64 cm. Pulmonic Valve: The pulmonic valve was not well visualized. Pulmonic valve regurgitation is not visualized. Aorta: Aortic dilatation noted. There is mild dilatation of the ascending aorta, measuring 41 mm. Venous: The inferior vena cava is dilated in size with less than 50% respiratory variability, suggesting right atrial pressure of 15 mmHg. IAS/Shunts: The interatrial septum was not well visualized.  LEFT VENTRICLE PLAX 2D LVIDd:         5.10 cm LVIDs:          3.50 cm LV PW:         1.40 cm LV IVS:        1.40 cm LVOT diam:     2.00 cm LV SV:         43 LV SV Index:   19 LVOT Area:     3.14 cm  RIGHT VENTRICLE            IVC RV Basal diam:  3.70 cm    IVC diam: 2.30 cm RV S prime:     7.81 cm/s TAPSE (M-mode): 1.2 cm LEFT ATRIUM             Index       RIGHT ATRIUM           Index LA diam:        4.50 cm 1.97 cm/m  RA Area:     26.70 cm LA Vol (A2C):   52.4 ml 22.95 ml/m RA Volume:   87.00  ml  38.10 ml/m LA Vol (A4C):   72.1 ml 31.57 ml/m LA Biplane Vol: 66.3 ml 29.03 ml/m  AORTIC VALVE AV Area (Vmax):    0.64 cm AV Area (Vmean):   0.65 cm AV Area (VTI):     0.64 cm AV Vmax:           301.50 cm/s AV Vmean:          210.750 cm/s AV VTI:            0.675 m AV Peak Grad:      36.4 mmHg AV Mean Grad:      20.5 mmHg LVOT Vmax:         61.12 cm/s LVOT Vmean:        43.700 cm/s LVOT VTI:          0.138 m LVOT/AV VTI ratio: 0.20  AORTA Ao Root diam: 3.85 cm Ao Asc diam:  4.10 cm TRICUSPID VALVE TR Peak grad:   40.4 mmHg TR Vmax:        318.00 cm/s  SHUNTS Systemic VTI:  0.14 m Systemic Diam: 2.00 cm Buford Dresser MD Electronically signed by Buford Dresser MD Signature Date/Time: 03/19/2021/12:33:49 PM    Final     Labs:  Basic Metabolic Panel: Recent Labs  Lab 04/06/21 0717  NA 134*  K 4.3  CL 98  CO2 28  GLUCOSE 149*  BUN 19  CREATININE 1.03  CALCIUM 8.8*    CBC: Recent Labs  Lab 04/06/21 0717  WBC 6.7  NEUTROABS 3.4  HGB 9.8*  HCT 31.3*  MCV 88.4  PLT 303    CBG: Recent Labs  Lab 04/10/21 0603 04/10/21 1135 04/10/21 1657 04/10/21 2128 04/12/21 2105  GLUCAP 126* 141* 169* 133* 204*   Family history.  Mother with diabetes.  Father with emphysema and myocardial infarction.  Negative colon cancer esophageal cancer or rectal cancer or stomach cancer  Brief HPI:   Oneal Schoenberger is a 80 y.o. right-handed male with history of CKD stage III, diabetes mellitus, hypertension, hyperlipidemia, AV block Mobitz 1 with  history of pacemaker removed 12/14/2020 due to staph bacteremia, idiopathic pulmonary fibrosis, chronic atrial fibrillation, CAD with CABG.  Per chart review lives with spouse two-level home bed and bath main level.  Ramped entrance.  Independent with assistive device.  Wife does assist with some ADLs.  Presented 03/17/2021 with gangrenous right foot, ischemic changes over the heel first metatarsal head and lateral incision with noted recent right fifth ray amputation 01/22/2021 as well as right tibial calcaneal fusion.  Due to progressive gangrenous changes limb was not felt to be salvageable and underwent right transtibial amputation 03/17/2021 per Dr. Sharol Given and wound VAC applied.  Hospital course complicated by acute blood loss anemia.  Postoperative anemia 6.0.  Transfused 2 units packed red blood cells latest hemoglobin 8.0.  His renal function remained stable latest creatinine 1.12.  Patient did have some orthostasis requiring norepinephrine.  Chronic Eliquis remained on hold for BKA until 03/24/2021 and resumed.  Echocardiogram showed ejection fraction of 50 to 55% no wall motion abnormalities.  Wound VAC was removed 03/24/2021.  Therapy evaluations completed due to patient decreased functional mobility was admitted for a comprehensive rehab program.   Hospital Course: Layla Gramm was admitted to rehab 03/26/2021 for inpatient therapies to consist of PT, ST and OT at least three hours five days a week. Past admission physiatrist, therapy team and rehab RN have worked together to provide customized collaborative inpatient  rehab.  Pertaining to patient's right BKA 03/17/2021.  Wound VAC and been removed.  Neurovascular sensation intact shrinker as directed.  Patient would follow-up with Dr. Sharol Given.  Chronic Eliquis has been resumed no bleeding episodes.  Pain managed with use of Neurontin as directed as well as tramadol as needed.  Mood stabilization with Lexapro as well as Ativan and emotional support provided.   Acute blood loss anemia stable latest hemoglobin 9.8 maintained on iron supplement.  Blood pressure somewhat soft initially and rebounding nicely his metoprolol could be resumed.  Diabetes mellitus hemoglobin A1c 7.8 insulin therapy as directed with full diabetic teaching.  Lipitor ongoing for hyperlipidemia.  History of AV block Mobitz 1 pacemaker that was removed 12/14/2020 due to staph bacteremia patient would follow-up cardiology services.  Cardiac rate controlled.  Idiopathic pulmonary fibrosis CPAP as directed.  No oxygen needed at this time.  CKD stage III latest creatinine 1.03 and monitored.   Blood pressures were monitored on TID basis and soft and monitored  Diabetes has been monitored with ac/hs CBG checks and SSI was use prn for tighter BS control.    Rehab course: During patient's stay in rehab weekly team conferences were held to monitor patient's progress, set goals and discuss barriers to discharge. At admission, patient required minimal assist stand pivot transfer squat pivot transfers.  Minimal assist 3 feet rolling walker.  Set up grooming set up upper body bathing moderate assist lower body bathing set up upper body dressing moderate assist lower body dressing  Physical exam.  Blood pressure 116/69 pulse 60 temperature 98.5 respirations 18 oxygen saturation 98% room air Constitutional.  No acute distress HEENT Head.  Normocephalic and atraumatic Eyes.  Pupils round and reactive to light no discharge without nystagmus Neck.  Supple nontender no JVD without thyromegaly Cardiac irregular irregular Abdomen.  Soft nontender positive bowel sounds without rebound Respiratory effort normal no respiratory distress without wheeze Skin.  Right BKA with dressing intact Neurologic.  Alert oriented x3 Motor.  Bilateral upper extremities 5/5 proximal distal Left lower extremity edema 5/5 proximal distal Right lower extremity hip flexion knee extension 4+/5 Sensation diminished to light  touch distally  He/She  has had improvement in activity tolerance, balance, postural control as well as ability to compensate for deficits. He/She has had improvement in functional use RUE/LUE  and RLE/LLE as well as improvement in awareness.  Transferred to edge of bed with contact-guard assist.  Required set up assist to begin donning shorts followed by contact-guard rolling walker for STS to finish lower body dressing.  Transfer to wheelchair with contact-guard wheelchair propulsion with supervision.  Ambulates 12 feet minimal assist rolling walker.  Transferred sit to supine with minimal assist on mat table.  Trained in functional activity at raised tabletop level to increase standing balance and tolerance for ADLs and independent ADLs with close supervision for safety.  Full family teaching completed plan discharged to home       Disposition: Discharged to home    Diet: Diabetic diet  Special Instructions: No driving smoking or alcohol  Santyl ointment applied to left toe as needed   Medications at discharge 1.  Tylenol as needed 2.  Eliquis 5 mg p.o. twice daily 3.  Vitamin C 1000 mg p.o. daily 4.  Lipitor 20 mg p.o. twice daily 5.  Colace 100 mg daily 6.  Lexapro 10 mg p.o. daily 7.  Neurontin 300 mg p.o. 3 times daily 8.  NovoLog 6 units 3 times daily with meals  9.  Lantus insulin 8 units nightly 10.  Niferex 150 mg p.o. daily 11.  Ativan 1 mg p.o. nightly 12.  Magnesium oxide 400 mg p.o. daily 13.  Multivitamin daily 14.  Protonix 40 mg p.o. twice daily 15.  Ultram 50 mg p.o. every 8 hours as needed pain 16.  Lopressor 25 mg twice daily 17.  Lasix 40 mg daily  30-35 minutes were spent completing discharge summary and discharge planning  Discharge Instructions     Ambulatory referral to Physical Medicine Rehab   Complete by: As directed    Moderate complexity follow-up 1 to 2 weeks right BKA        Follow-up Information     Jamse Arn, MD Follow up.    Specialty: Physical Medicine and Rehabilitation Why: Office to call for appointment Contact information: [address] [place] [address] [phone]         [name], MD Follow up.   Specialty: Orthopedic Surgery Why: Call for appointment Contact information: [place] [address] [phone]         Lelon Perla, MD Follow up.   Specialty: Cardiology Why: Call for appointment as needed Contact information: [place] [address] [phone]                 Signed: Cathlyn Parsons 04/13/2021, 5:22 AM Patient was seen, face-face, and physical exam performed by me on day of discharge, greater than 30 minutes of total time spent.. Please see progress note from day of discharge as well.  Delice Lesch, MD, ABPMR

## 2021-04-12 LAB — GLUCOSE, CAPILLARY
Glucose-Capillary: 133 mg/dL — ABNORMAL HIGH (ref 70–99)
Glucose-Capillary: 204 mg/dL — ABNORMAL HIGH (ref 70–99)

## 2021-04-12 MED ORDER — APIXABAN 5 MG PO TABS: 5.0000 mg | ORAL_TABLET | Freq: Two times a day (BID) | ORAL | 11 refills | Status: DC

## 2021-04-12 MED ORDER — VITAMIN C-ROSE HIPS 500 MG PO TABS: 500.0000 mg | ORAL_TABLET | Freq: Every day | ORAL | 0 refills | Status: DC

## 2021-04-12 MED ORDER — POLYSACCHARIDE IRON COMPLEX 150 MG PO CAPS: 150.0000 mg | ORAL_CAPSULE | Freq: Every day | ORAL | 0 refills | Status: DC

## 2021-04-12 MED ORDER — ZINC SULFATE 220 (50 ZN) MG PO CAPS: 220.0000 mg | ORAL_CAPSULE | Freq: Every day | ORAL | 0 refills | Status: DC

## 2021-04-12 MED ORDER — FUROSEMIDE 40 MG PO TABS: 40.0000 mg | ORAL_TABLET | Freq: Every day | ORAL | 0 refills | Status: DC

## 2021-04-12 MED ORDER — GABAPENTIN 300 MG PO CAPS: 300.0000 mg | ORAL_CAPSULE | Freq: Three times a day (TID) | ORAL | 0 refills | Status: DC

## 2021-04-12 MED ORDER — ALBUTEROL SULFATE HFA 108 (90 BASE) MCG/ACT IN AERS: 2.0000 | INHALATION_SPRAY | RESPIRATORY_TRACT | 0 refills | Status: AC | PRN

## 2021-04-12 MED ORDER — DOCUSATE SODIUM 100 MG PO CAPS: 100.0000 mg | ORAL_CAPSULE | Freq: Every day | ORAL | 0 refills | Status: DC

## 2021-04-12 MED ORDER — PANTOPRAZOLE SODIUM 40 MG PO TBEC: 40.0000 mg | DELAYED_RELEASE_TABLET | Freq: Two times a day (BID) | ORAL | 0 refills | Status: DC

## 2021-04-12 MED ORDER — ESCITALOPRAM OXALATE 10 MG PO TABS: 10.0000 mg | ORAL_TABLET | Freq: Every day | ORAL | 0 refills | Status: DC

## 2021-04-12 MED ORDER — ZINC OXIDE 12.8 % EX OINT: 1.0000 "application " | TOPICAL_OINTMENT | Freq: Two times a day (BID) | CUTANEOUS | 0 refills | Status: DC

## 2021-04-12 MED ORDER — ATORVASTATIN CALCIUM 10 MG PO TABS: 20.0000 mg | ORAL_TABLET | Freq: Two times a day (BID) | ORAL | 0 refills | Status: DC

## 2021-04-12 MED ORDER — INSULIN GLARGINE 100 UNIT/ML SOLOSTAR PEN: 8.0000 [IU] | PEN_INJECTOR | Freq: Every day | SUBCUTANEOUS | 11 refills | Status: DC

## 2021-04-12 MED ORDER — METOPROLOL TARTRATE 50 MG PO TABS: 25.0000 mg | ORAL_TABLET | Freq: Every day | ORAL | 0 refills | Status: DC

## 2021-04-12 MED ORDER — NOVOLOG FLEXPEN 100 UNIT/ML ~~LOC~~ SOPN: 6.0000 [IU] | PEN_INJECTOR | Freq: Three times a day (TID) | SUBCUTANEOUS | 11 refills | Status: DC

## 2021-04-12 MED ORDER — LORAZEPAM 1 MG PO TABS: 1.0000 mg | ORAL_TABLET | Freq: Every day | ORAL | 0 refills | Status: DC

## 2021-04-12 MED ORDER — MAGNESIUM OXIDE -MG SUPPLEMENT 400 (240 MG) MG PO TABS: 400.0000 mg | ORAL_TABLET | Freq: Every day | ORAL | 1 refills | Status: DC

## 2021-04-12 MED ORDER — TRAMADOL HCL 50 MG PO TABS: 50.0000 mg | ORAL_TABLET | Freq: Three times a day (TID) | ORAL | 0 refills | Status: DC | PRN

## 2021-04-12 NOTE — Progress Notes (Signed)
Physical Therapy Discharge Summary  Patient Details  Name: Shane [name] MRN: [id number] Date of Birth: Mar 30, 1941  Today's Date: 04/12/2021   Patient has met 7 of 10 long term goals due to improved activity tolerance, improved balance, increased strength and ability to compensate for deficits.  Patient to discharge at a wheelchair level Supervision, with CGA needed for car transfers and sit to stand.   Patient's care partner is independent to provide the necessary physical assistance at discharge.  Reasons goals not met: Patient with limited transfers due to balance for sit to stand to RW and for car transfers needing CGA.  Recommendation:  Patient will benefit from ongoing skilled PT services in home health setting to continue to advance safe functional mobility, address ongoing impairments in balance, strength, safety, and minimize fall risk.  Equipment: 3:1 & tub bench  Reasons for discharge: treatment goals met and discharge from hospital  Patient/family agrees with progress made and goals achieved: Yes  PT Discharge Precautions/Restrictions Precautions Precautions: Fall;Other (comment) Required Braces or Orthoses: Other Brace Other Brace: R LE limb protector; L LE surgical shoe Restrictions Weight Bearing Restrictions: Yes RLE Weight Bearing: Non weight bearing Pain Pain Assessment Pain Score: 0-No pain Vision/Perception  Perception Perception: Within Functional Limits Praxis Praxis: Intact  Cognition Overall Cognitive Status: Within Functional Limits for tasks assessed Arousal/Alertness: Awake/alert Orientation Level: Oriented X4 Problem Solving: Appears intact Safety/Judgment: Appears intact Sensation Sensation Light Touch: Impaired Detail Peripheral sensation comments: decreased peripheral sensation below knee on L LE Light Touch Impaired Details: Impaired LLE Hot/Cold: Not tested Proprioception: Appears Intact Stereognosis: Not  tested Coordination Gross Motor Movements are Fluid and Coordinated: No Fine Motor Movements are Fluid and Coordinated: Yes Coordination and Movement Description: impaired due to R LE BKA Motor  Motor Motor: Other (comment) Motor - Discharge Observations: Somewhat improved overall strength and endurance, but continued deficits in single leg balance/dynamic balance.  Mobility Bed Mobility Bed Mobility: Supine to Sit Supine to Sit: Independent with assistive device Transfers Transfers: Stand to Sit;Stand Pivot Transfers;Squat Pivot Transfers;Sit to Stand Sit to Stand: Contact Guard/Touching assist Stand to Sit: Supervision/Verbal cueing Stand Pivot Transfers: Supervision/Verbal cueing;Contact Guard/Touching assist Squat Pivot Transfers: Supervision/Verbal cueing Lateral/Scoot Transfers: Supervision/Verbal cueing Transfer (Assistive device): Rolling walker Locomotion  Gait Ambulation: Yes Gait Assistance: Minimal Assistance - Patient > 75% Gait Distance (Feet): 14 Feet Assistive device: Rolling walker Gait Assistance Details: Verbal cues for safe use of DME/AE Gait Gait Pattern: Impaired Gait Pattern: Poor foot clearance - left Stairs / Additional Locomotion Stairs: No Wheelchair Mobility Wheelchair Mobility: Yes Wheelchair Assistance: Chartered loss adjuster: Both upper extremities Wheelchair Parts Management: Needs assistance Distance: 200'  Trunk/Postural Assessment  Cervical Assessment Cervical Assessment: Exceptions to [facility] (cervical stiffness, forward head, h/o cervical surgery) Thoracic Assessment Thoracic Assessment: Exceptions to [facility] (rounded shoulders) Lumbar Assessment Lumbar Assessment: Exceptions to [facility] (posterior pelvic tilt) Postural Control Postural Control: Within Functional Limits Postural Limitations: impaired due to R BKA with NWBing restrictions requiring B UE support  Balance Balance Balance Assessed: Yes Static Sitting  Balance Static Sitting - Balance Support: Feet supported Static Sitting - Level of Assistance: 7: Independent Dynamic Sitting Balance Dynamic Sitting - Balance Support: Feet supported Dynamic Sitting - Level of Assistance: 6: Modified independent (Device/Increase time) Sitting balance - Comments: noted limitations with dynamic tasks Static Standing Balance Static Standing - Balance Support: During functional activity Static Standing - Level of Assistance: 5: Stand by assistance Dynamic Standing Balance Dynamic Standing - Balance Support: During functional activity;Bilateral  upper extremity supported Dynamic Standing - Level of Assistance: 4: Min assist Dynamic Standing - Balance Activities: Forward lean/weight shifting;Reaching across midline Extremity Assessment  RUE Assessment RUE Assessment: Within Functional Limits LUE Assessment LUE Assessment: Within Functional Limits RLE Assessment RLE Assessment: Exceptions to [facility] Passive Range of Motion (PROM) Comments: WFL Active Range of Motion (AROM) Comments: little stiff knee extension limited ~ 10 degrees General Strength Comments: strength 4 to 4+ overall LLE Assessment Active Range of Motion (AROM) Comments: [facility] General Strength Comments: WFL    Jamison Oka, PT 04/12/2021, 5:02 PM

## 2021-04-12 NOTE — Discharge Instructions (Signed)
Inpatient Rehab Discharge Instructions  [place] Discharge date and time: No discharge date for patient encounter.   Activities/Precautions/ Functional Status: Activity: As tolerated Diet: Diabetic diet Wound Care: Cleanse incision with Dial soap and pat dry Functional status:  ___ No restrictions     ___ Walk up steps independently ___ 24/7 supervision/assistance   ___ Walk up steps with assistance ___ Intermittent supervision/assistance  ___ Bathe/dress independently ___ Walk with walker     _x__ Bathe/dress with assistance ___ Walk Independently    ___ Shower independently ___ Walk with assistance    ___ Shower with assistance ___ No alcohol     ___ Return to work/school ________  Special Instructions: No driving smoking or alcohol  Santyl to left toe daily as needed    COMMUNITY REFERRALS UPON DISCHARGE:    Home Health:   PT, OT & RN                  [place] Equipment/Items Ordered: 3 IN 1 & TUB BENCH                                                 Agency/Supplier: [facility]  [phone]    My questions have been answered and I understand these instructions. I will adhere to these goals and the provided educational materials after my discharge from the hospital.  Patient/Caregiver Signature _______________________________ Date __________  Clinician Signature _______________________________________ Date __________  Please bring this form and your medication list with you to all your follow-up doctor's appointments.   Information on my medicine - ELIQUIS (apixaban)  This medication education was reviewed with me or my healthcare representative as part of my discharge preparation.  The pharmacist that spoke with me during my hospital stay was:  Onnie Boer, RPH-CPP  Why was Eliquis prescribed for you? Eliquis was prescribed for you to reduce the risk of a blood clot forming that can cause a stroke if you have  a medical condition called atrial fibrillation (a type of irregular heartbeat).  What do You need to know about Eliquis ? Take your Eliquis TWICE DAILY - one tablet in the morning and one tablet in the evening with or without food. If you have difficulty swallowing the tablet whole please discuss with your pharmacist how to take the medication safely.  Take Eliquis exactly as prescribed by your doctor and DO NOT stop taking Eliquis without talking to the doctor who prescribed the medication.  Stopping may increase your risk of developing a stroke.  Refill your prescription before you run out.  After discharge, you should have regular check-up appointments with your healthcare provider that is prescribing your Eliquis.  In the future your dose may need to be changed if your kidney function or weight changes by a significant amount or as you get older.  What do you do if you miss a dose? If you miss a dose, take it as soon as you remember on the same day and resume taking twice daily.  Do not take more than one dose of ELIQUIS at the same time to make up a missed dose.  Important Safety Information A possible side effect of Eliquis is bleeding. You should call your healthcare provider right away if you experience any of the following: ? Bleeding from an injury or your  nose that does not stop. ? Unusual colored urine (red or dark brown) or unusual colored stools (red or black). ? Unusual bruising for unknown reasons. ? A serious fall or if you hit your head (even if there is no bleeding).  Some medicines may interact with Eliquis and might increase your risk of bleeding or clotting while on Eliquis. To help avoid this, consult your healthcare provider or pharmacist prior to using any new prescription or non-prescription medications, including herbals, vitamins, non-steroidal anti-inflammatory drugs (NSAIDs) and supplements.  This website has more information on Eliquis (apixaban):  http://www.eliquis.com/eliquis/home

## 2021-04-12 NOTE — Progress Notes (Signed)
PROGRESS NOTE   Subjective/Complaints: Patient seen laying in bed this AM.  He states he slept well overnight.  He states he had a good weekend.  He notes he is having BMs daily.  He is looking forward to discharge tomorrow.   ROS: Denies CP, SOB, N/V/D  Objective:   No results found. No results for input(s): WBC, HGB, HCT, PLT in the last 72 hours. No results for input(s): NA, K, CL, CO2, GLUCOSE, BUN, CREATININE, CALCIUM in the last 72 hours.  Intake/Output Summary (Last 24 hours) at 04/12/2021 1316 Last data filed at 04/12/2021 1305 Gross per 24 hour  Intake 780 ml  Output 975 ml  Net -195 ml     Pressure Injury 03/26/21 Toe (Comment  which one) Anterior;Left Unstageable - Full thickness tissue loss in which the base of the injury is covered by slough (yellow, tan, gray, green or brown) and/or eschar (tan, brown or black) in the wound bed. pressu (Active)  03/26/21 1423  Location: Toe (Comment  which one)  Location Orientation: Anterior;Left  Staging: Unstageable - Full thickness tissue loss in which the base of the injury is covered by slough (yellow, tan, gray, green or brown) and/or eschar (tan, brown or black) in the wound bed.  Wound Description (Comments): pressure injury on medial aspect of great toe knuckle  Present on Admission: Yes    Physical Exam: Vital Signs Blood pressure 133/63, pulse 60, temperature 97.7 F (36.5 C), temperature source Oral, resp. rate 18, height 6' (1.829 m), weight 103.7 kg, SpO2 99 %.  Constitutional: No distress . Vital signs reviewed. HENT: Normocephalic.  Atraumatic. Eyes: EOMI. No discharge. Cardiovascular: No JVD.  RRR. Respiratory: Normal effort.  No stridor.  Bilateral clear to auscultation. GI: Non-distended.  BS +. Skin: Warm and dry.  Stump with dressing CDI.  Toe with dressing CDI. Psych: Normal mood.  Normal behavior. Musc: Left BKA with improving edema and  tenderness Neuro: Alert Motor: Grossly 5/5 throughout, stable  Assessment/Plan: 1. Functional deficits which require 3+ hours per day of interdisciplinary therapy in a comprehensive inpatient rehab setting.  Physiatrist is providing close team supervision and 24 hour management of active medical problems listed below.  Physiatrist and rehab team continue to assess barriers to discharge/monitor patient progress toward functional and medical goals  Care Tool:  Bathing    Body parts bathed by patient: Right arm,Left arm,Chest,Abdomen,Front perineal area,Right upper leg,Left upper leg,Face,Left lower leg,Buttocks   Body parts bathed by helper: Buttocks Body parts n/a: Right lower leg   Bathing assist Assist Level: Contact Guard/Touching assist     Upper Body Dressing/Undressing Upper body dressing   What is the patient wearing?: Pull over shirt    Upper body assist Assist Level: Set up assist    Lower Body Dressing/Undressing Lower body dressing      What is the patient wearing?: Underwear/pull up,Pants     Lower body assist Assist for lower body dressing: Supervision/Verbal cueing     Toileting Toileting Toileting Activity did not occur (Clothing management and hygiene only): N/A (no void or bm)  Toileting assist Assist for toileting: Supervision/Verbal cueing     Transfers Chair/bed transfer  Transfers  assist     Chair/bed transfer assist level: Contact Guard/Touching assist     Locomotion Ambulation   Ambulation assist      Assist level: Minimal Assistance - Patient > 75% Assistive device: Walker-rolling Max distance: 12   Walk 10 feet activity   Assist  Walk 10 feet activity did not occur: Safety/medical concerns  Assist level: Contact Guard/Touching assist Assistive device: Walker-rolling   Walk 50 feet activity   Assist Walk 50 feet with 2 turns activity did not occur: Safety/medical concerns         Walk 150 feet  activity   Assist Walk 150 feet activity did not occur: Safety/medical concerns         Walk 10 feet on uneven surface  activity   Assist Walk 10 feet on uneven surfaces activity did not occur: Safety/medical concerns         Wheelchair     Assist Will patient use wheelchair at discharge?: Yes Type of Wheelchair: Manual    Wheelchair assist level: Supervision/Verbal cueing Max wheelchair distance: 120'    Wheelchair 50 feet with 2 turns activity    Assist        Assist Level: Supervision/Verbal cueing   Wheelchair 150 feet activity     Assist  Wheelchair 150 feet activity did not occur:  (SOB)   Assist Level: Contact Guard/Touching assist,Minimal Assistance - Patient > 75%    Medical Problem List and Plan: 1.  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  Wound VAC removed 03/24/2021.  Continue CIR, pt and family edu 2.  Antithrombotics: -DVT/anticoagulation: Eliquis             -antiplatelet therapy: N/A 3. Pain Management: Neurontin 300 mg 3 times daily             Controlled with meds on 5/9  Monitor with increased exertion, particularly for phantom limb pain 4. Anxiety: Continue Lexapro 10 mg daily, Ativan 1 mg nightly             -antipsychotic agents: N/A 5. Neuropsych: This patient is capable of making decisions on his own behalf. 6. Skin/Wound Care: continue shrinker to stump  -Santyl ordered to left great toe -surgery to evaluate  Added Zinc paste BID for buttocks due to excoriation 7. Fluids/Electrolytes/Nutrition: Routine in and outs 8.  Acute blood loss anemia.  Continue iron supplement.    Hemoglobin 9.8 on 5/3  Continue to monitor 9.    Blood pressure: d/ced metoprolol.   Controlled on 5/6 10.  Diabetes mellitus with hyperglycemia.  Latest hemoglobin A1c 7.8.  NovoLog 4 units 3 times daily with meals, increased to 6U on 5/4  Lantus insulin 12 units nightly decreased to 8 on 4/27.    ?Elevated  on 5/9  Check blood sugars before meals and at bedtime 11.  GERD.  Continue Protonix 12.  Dyslipidemia: Continue Lipitor 13.  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  Pacemaker removed 12/14/2020 due to staph bacteremia.  Follow-up cardiology services 14.  Idiopathic pulmonary fibrosis.  Continue CPAP 15.  Atrial fibrillation.  Cardiac rate controlled, regular.  Continue Eliquis.             Monitor increase activity 16.  CAD with CABG.  No chest pain or increasing shortness of breath 17.  CKD stage II/III.    Creatinine 1.03 on 5/3  Continue to monitor 18.  Hyponatremia  Sodium 134 on 5/3  Continue to monitor 19. Pulmonary  fibrosis   Last infusion on 4/26  Albuterol scheduled (?  Home medication), discontinued by respiratory, reordered  Controlled   LOS: 17 days A FACE TO FACE EVALUATION WAS PERFORMED  Letha Mirabal Lorie Phenix 04/12/2021, 1:16 PM

## 2021-04-12 NOTE — Progress Notes (Signed)
Occupational Therapy Discharge Summary  Patient Details  Name: Shane [name] MRN: [id number] Date of Birth: Apr 15, 1941  Today's Date: 04/12/2021 OT Individual Time: 0900-1000 OT Individual Time Calculation (min): 60 min   Pt received for OT d/c therapy session supine in bed, agreeable to session to plan for d/c to home, no pain reported. Pt donned L [place]co shoe with mod I/set up using foot stool and donned amp shield independently sitting EOB. LBD completed EOB and in stance CGA. Bed<>w/c stand pivot transfers completed CGA for balance. Grooming (oral care, brushing hair) performed mod I in w/c at sink. Pt practiced toilet transfer to raised toilet seat in simulation of home environment using grab bars (squat pivot) with close supervision. Education/recommendations on safest transfers, body mechanics, supervision and pt demo'd good understanding. Completed dynamic standing activities in standing w/ RW at close supervision demoing improved standing balance (~2 min x2) with seated rest breaks due to SOB.   Patient has met 7 of 8 long term goals due to improved activity tolerance, improved balance, postural control, ability to compensate for deficits and improved coordination.  Patient to discharge at overall Supervision level.  Patient's care partner is independent to provide the necessary physical assistance at discharge.    Reasons goals not met: Patient made progress in all functional areas; recommend close supervision and occasional CGA in activities in stance due to being a high risk for falling due to new amputation. All goals met/adequate for d/c as pt will have 24/hr supervision/assistance as needed.  Recommendation:  [place] for short period to ensure safe d/c to home environment.  Equipment: Shower bench and BSC ordered. Pt to use adaptive equipment as needed.   Reasons for discharge: treatment goals met  Patient/family agrees with progress made and goals achieved: Yes  OT  Discharge Precautions/Restrictions  Precautions Precautions: Fall;Other (comment) Precaution Comments: monitor BP for orthostatic hypotension, SpO2 due pulmonary hx, chronic wound on L toe Restrictions Weight Bearing Restrictions: Yes RLE Weight Bearing: Non weight bearing Other Position/Activity Restrictions: Amp shield Pain Pain Assessment Pain Scale: 0-10 Pain Score: 0-No pain ADL ADL Equipment Provided: Reacher,Sock aid,Long-handled shoe horn,Long-handled sponge Eating: Independent Where Assessed-Eating: Wheelchair,Bed level Grooming: Modified independent Where Assessed-Grooming: Sitting at sink,Wheelchair Upper Body Bathing: Modified independent Where Assessed-Upper Body Bathing: Shower Lower Body Bathing: Supervision/safety Where Assessed-Lower Body Bathing: Shower Upper Body Dressing: Independent Where Assessed-Upper Body Dressing: Chair,Wheelchair,Edge of bed Lower Body Dressing: Supervision/safety Where Assessed-Lower Body Dressing: Edge of bed,Other (Comment) ([place]) Toileting: Supervision/safety Where Assessed-Toileting: Toilet,Bedside Commode Toilet Transfer: Close supervision Toilet Transfer Method: Sit pivot,Squat pivot Toilet Transfer Equipment: Bedside commode,Raised toilet seat,Grab bars Social research officer, government: Close supervision Social research officer, government Method: Sit pivot,Squat pivot Youth worker: Transfer tub bench,Grab bars Vision Baseline Vision/History: No visual deficits Patient Visual Report: No change from baseline Vision Assessment?: No apparent visual deficits Perception  Perception: Within Functional Limits Praxis Praxis: Intact Cognition Overall Cognitive Status: Within Functional Limits for tasks assessed Arousal/Alertness: Awake/alert Orientation Level: Oriented X4 Attention: Focused;Sustained Memory: Appears intact Awareness: Appears intact Problem Solving: Appears intact Safety/Judgment: Appears  intact Sensation Sensation Additional Comments: UB grossly intact Coordination Fine Motor Movements are Fluid and Coordinated: Yes Motor  Motor Motor - Discharge Observations: Somewhat improved overall strength and endurance, but continued deficits in single leg balance/dynamic balance. Mobility  Bed Mobility Bed Mobility: Supine to Sit Supine to Sit: Independent with assistive device Transfers Sit to Stand: Supervision/Verbal cueing Stand to Sit: Supervision/Verbal cueing  Trunk/Postural Assessment  Postural Control Postural Limitations: impaired  due to R BKA with NWBing restrictions requiring B UE support  Balance Balance Balance Assessed: Yes Static Sitting Balance Static Sitting - Level of Assistance: 7: Independent Dynamic Sitting Balance Dynamic Sitting - Level of Assistance: 6: Modified independent (Device/Increase time) Static Standing Balance Static Standing - Level of Assistance: 5: Stand by assistance Dynamic Standing Balance Dynamic Standing - Level of Assistance: 4: Min assist Extremity/Trunk Assessment RUE Assessment RUE Assessment: Within Functional Limits LUE Assessment LUE Assessment: Within Functional Limits   Mellissa Kohut 04/12/2021, 2:29 PM

## 2021-04-12 NOTE — Plan of Care (Signed)
  Problem: RH Balance Goal: LTG Patient will maintain dynamic sitting balance (PT) Description: LTG:  Patient will maintain dynamic sitting balance with assistance during mobility activities (PT) Outcome: Completed/Met Goal: LTG Patient will maintain dynamic standing balance (PT) Description: LTG:  Patient will maintain dynamic standing balance with assistance during mobility activities (PT) Outcome: Completed/Met   Problem: Sit to Stand Goal: LTG:  Patient will perform sit to stand with assistance level (PT) Description: LTG:  Patient will perform sit to stand with assistance level (PT) Outcome: Not Met (add Reason) Note: Needs CGA for balance   Problem: RH Bed Mobility Goal: LTG Patient will perform bed mobility with assist (PT) Description: LTG: Patient will perform bed mobility with assistance, with/without cues (PT). Outcome: Completed/Met   Problem: RH Bed to Chair Transfers Goal: LTG Patient will perform bed/chair transfers w/assist (PT) Description: LTG: Patient will perform bed to chair transfers with assistance (PT). Outcome: Not Met (add Reason) Note: Needs S for safety   Problem: RH Car Transfers Goal: LTG Patient will perform car transfers with assist (PT) Description: LTG: Patient will perform car transfers with assistance (PT). Outcome: Not Met (add Reason) Note: Needs CGA for safety   Problem: RH Ambulation Goal: LTG Patient will ambulate in controlled environment (PT) Description: LTG: Patient will ambulate in a controlled environment, # of feet with assistance (PT). Outcome: Completed/Met Goal: LTG Patient will ambulate in home environment (PT) Description: LTG: Patient will ambulate in home environment, # of feet with assistance (PT). Outcome: Completed/Met   Problem: RH Wheelchair Mobility Goal: LTG Patient will propel w/c in controlled environment (PT) Description: LTG: Patient will propel wheelchair in controlled environment, # of feet with assist  (PT) Outcome: Completed/Met Goal: LTG Patient will propel w/c in home environment (PT) Description: LTG: Patient will propel wheelchair in home environment, # of feet with assistance (PT). Outcome: Completed/Met  Magda Kiel, PT

## 2021-04-12 NOTE — Progress Notes (Signed)
Physical Therapy Session Note  Patient Details  Name: Shane [name] MRN: [id number] Date of Birth: 11-27-1941  Today's Date: 04/12/2021 PT Individual Time:Session1: 1018-1100; Arthor Captain: 3491-7915 PT Individual Time Calculation (min): 42 min &   Short Term Goals: Week 2:  PT Short Term Goal 1 (Week 3): STG=LTG due to ELOS  Skilled Therapeutic Interventions/Progress Updates:    Session1:  Patient in w/c already with shoe and limb protector and reports not breathing well.  RN informed and she set up breathing treatment.  Patient reported feeling some better, but dizzy and little weak, agreed to work on exercises.  Assisted in w/c to therapy gym.  Patient transferred to mat with S after w/c set up.  Patient seated and removed limb protector for LAQ w/ 5# on L and no weight on R x 15; sit to supine with S and performed therex as noted below.  Patient issued all for HEP including standing hip extension, hip abduction, sit<>stand and supine ITband stretch with strap.  Patient verbalized understanding of all.  Performed supine to sit with S and donned limb protector independently. Transfer to w/c with S.  Patient pushed in w/c to room and performed pivot transfer to bed with S.  Patient sit to supine with S and left with call bell and needs in reach and bed alarm active.  [place]:  Patient in supine and without complaints.  Supine to sit S and assisted to don shoe on L.  Transfer to stand with CGA and ambulated 14' with RW and min A.  Patient in w/c pushed to therapy gym.  Performed car transfer to simulated small SUV height with CGA.  Patient propelled w/c x 200' with S with one seated rest.  Performed standing balance activity with CGA at BITS tapping targets initially A-Z over 29min 51sec. Then tapping circles appearing over whole screen 27 in 2 minutes.  Patient in w/c assisted to room.  Performed squat pivot to bed with S.  Patient doffed limb protector and shoe and sit to supine with S.  Strength and  ROM measurements performed.  LEft with call bell and needs in reach.   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 Restrictions Weight Bearing Restrictions: Yes RLE Weight Bearing: Non weight bearing Other Position/Activity Restrictions: Amp shield General: PT Amount of Missed Time (min): 18 Minutes PT Missed Treatment Reason: Nursing care (needed breathing treatment) Pain: Pain Assessment Pain Scale: 0-10 Pain Score: 0-No pain   Exercises: Amputee Exercises Quad Sets: Strengthening;Both;Supine;15 reps Towel Squeeze: Strengthening;Both;Supine;15 reps Hip Extension: Strengthening;Both;Prone;15 reps Hip ABduction/ADduction: Strengthening;Both;Sidelying;15 reps Knee Flexion: Strengthening;Both;15 reps (prone) Other Exercises Other Exercises: Bridging on L with R leg lift 5 sec hold x 15   Therapy/Group: Individual Therapy  Reginia Naas 04/12/2021, 1:58 PM

## 2021-04-13 LAB — GLUCOSE, CAPILLARY
Glucose-Capillary: 110 mg/dL — ABNORMAL HIGH (ref 70–99)
Glucose-Capillary: 184 mg/dL — ABNORMAL HIGH (ref 70–99)
Glucose-Capillary: 110 mg/dL — ABNORMAL HIGH (ref 70–99)
Glucose-Capillary: 147 mg/dL — ABNORMAL HIGH (ref 70–99)
Glucose-Capillary: 138 mg/dL — ABNORMAL HIGH (ref 70–99)
Glucose-Capillary: 159 mg/dL — ABNORMAL HIGH (ref 70–99)
Glucose-Capillary: 238 mg/dL — ABNORMAL HIGH (ref 70–99)
Glucose-Capillary: 114 mg/dL — ABNORMAL HIGH (ref 70–99)

## 2021-04-13 NOTE — Progress Notes (Signed)
Inpatient Rehabilitation Care Coordinator Discharge Note  The overall goal for the admission was met for:   Discharge location: [place] HIRED CAREGIVER 24/7  Length of Stay: Yes-18 DAYS  Discharge activity level: Yes-SUPERVISION-CGA LEVEL  Home/community participation: Yes  Services provided included: MD, RD, PT, OT, RN, CM, Pharmacy, Neuropsych and SW  Financial Services: Private Insurance: [facility]  Choices offered to/list presented to:PT AND WIFE  Follow-up services arranged: Home Health: [facility]-PT, OT RN, DME: [facility]-3 IN 1 & [facility] and Patient/Family request agency HH: [facility], DME: NO PREF  Comments (or additional information):WIFE WAS HERE FOR EDUCATION LAST [place].  Patient/Family verbalized understanding of follow-up arrangements: Yes  Individual responsible for coordination of the follow-up plan: PATRICIA-WIFE [phone]  Confirmed correct DME delivered: Elease Hashimoto 04/13/2021    Elease Hashimoto

## 2021-04-13 NOTE — Progress Notes (Signed)
PROGRESS NOTE   Subjective/Complaints: Patient seen laying in bed this AM.  He states he slept well overnight. Family at bedside. He is ready for discharge.   ROS: Denies CP, SOB, N/V/D  Objective:   No results found. No results for input(s): WBC, HGB, HCT, PLT in the last 72 hours. No results for input(s): NA, K, CL, CO2, GLUCOSE, BUN, CREATININE, CALCIUM in the last 72 hours.  Intake/Output Summary (Last 24 hours) at 04/13/2021 1224 Last data filed at 04/13/2021 0710 Gross per 24 hour  Intake 720 ml  Output 601 ml  Net 119 ml     Pressure Injury 03/26/21 Toe (Comment  which one) Anterior;Left Unstageable - Full thickness tissue loss in which the base of the injury is covered by slough (yellow, tan, gray, green or brown) and/or eschar (tan, brown or black) in the wound bed. pressu (Active)  03/26/21 1423  Location: Toe (Comment  which one)  Location Orientation: Anterior;Left  Staging: Unstageable - Full thickness tissue loss in which the base of the injury is covered by slough (yellow, tan, gray, green or brown) and/or eschar (tan, brown or black) in the wound bed.  Wound Description (Comments): pressure injury on medial aspect of great toe knuckle  Present on Admission: Yes    Physical Exam: Vital Signs Blood pressure 122/64, pulse 62, temperature 98.1 F (36.7 C), temperature source Oral, resp. rate 18, height 6' (1.829 m), weight 103.7 kg, SpO2 98 %.  Constitutional: No distress . Vital signs reviewed. HENT: Normocephalic.  Atraumatic. Eyes: EOMI. No discharge. Cardiovascular: No JVD.  RRR. Respiratory: Normal effort.  No stridor.  Bilateral clear to auscultation. GI: Non-distended.  BS +. Skin: Warm and dry.   Stump with staples CDI Toe ulcer healing Psych: Normal mood.  Normal behavior. Musc: Left BKA with improving edema and tenderness Neuro: Alert Motor: Grossly 5/5 throughout,  stable  Assessment/Plan: 1. Functional deficits which require 3+ hours per day of interdisciplinary therapy in a comprehensive inpatient rehab setting.  Physiatrist is providing close team supervision and 24 hour management of active medical problems listed below.  Physiatrist and rehab team continue to assess barriers to discharge/monitor patient progress toward functional and medical goals  Care Tool:  Bathing    Body parts bathed by patient: Right arm,Left arm,Chest,Abdomen,Front perineal area,Right upper leg,Left upper leg,Face,Left lower leg,Buttocks   Body parts bathed by helper: Buttocks Body parts n/a: Right lower leg   Bathing assist Assist Level: Contact Guard/Touching assist     Upper Body Dressing/Undressing Upper body dressing   What is the patient wearing?: Pull over shirt    Upper body assist Assist Level: Set up assist    Lower Body Dressing/Undressing Lower body dressing      What is the patient wearing?: Underwear/pull up,Pants     Lower body assist Assist for lower body dressing: Supervision/Verbal cueing     Toileting Toileting Toileting Activity did not occur (Clothing management and hygiene only): N/A (no void or bm)  Toileting assist Assist for toileting: Supervision/Verbal cueing     Transfers Chair/bed transfer  Transfers assist     Chair/bed transfer assist level: Supervision/Verbal cueing     Locomotion Ambulation  Ambulation assist      Assist level: Minimal Assistance - Patient > 75% Assistive device: Walker-rolling Max distance: 14'   Walk 10 feet activity   Assist  Walk 10 feet activity did not occur: Safety/medical concerns  Assist level: Contact Guard/Touching assist Assistive device: Walker-rolling   Walk 50 feet activity   Assist Walk 50 feet with 2 turns activity did not occur: Safety/medical concerns         Walk 150 feet activity   Assist Walk 150 feet activity did not occur: Safety/medical  concerns         Walk 10 feet on uneven surface  activity   Assist Walk 10 feet on uneven surfaces activity did not occur: Safety/medical concerns         Wheelchair     Assist Will patient use wheelchair at discharge?: Yes Type of Wheelchair: Manual    Wheelchair assist level: Supervision/Verbal cueing Max wheelchair distance: 200'    Wheelchair 50 feet with 2 turns activity    Assist        Assist Level: Supervision/Verbal cueing   Wheelchair 150 feet activity     Assist  Wheelchair 150 feet activity did not occur:  (SOB)   Assist Level: Supervision/Verbal cueing    Medical Problem List and Plan: 1.  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  Wound VAC removed 03/24/2021.  DC today  Patient may follow up with [facility] 2.  Antithrombotics: -DVT/anticoagulation: Eliquis             -antiplatelet therapy: N/A 3. Pain Management: Neurontin 300 mg 3 times daily             Controlled with meds on 5/10  Monitor with increased exertion, particularly for phantom limb pain 4. Anxiety: Continue Lexapro 10 mg daily, Ativan 1 mg nightly             -antipsychotic agents: N/A 5. Neuropsych: This patient is capable of making decisions on his own behalf. 6. Skin/Wound Care: continue shrinker to stump  -Santyl ordered to left great toe  Added Zinc paste BID for buttocks due to excoriation 7. Fluids/Electrolytes/Nutrition: Routine in and outs 8.  Acute blood loss anemia.  Continue iron supplement.    Hemoglobin 9.8 on 5/3  Continue to monitor 9.    Blood pressure: d/ced metoprolol.   Controlled on 5/6 10.  Diabetes mellitus with hyperglycemia.  Latest hemoglobin A1c 7.8.  NovoLog 4 units 3 times daily with meals, increased to 6U on 5/4  Lantus insulin 12 units nightly decreased to 8 on 4/27.    Had been relatively controlled, labile on 5/10, monitor in ambulatory setting with further adjustments as necessary  Check blood  sugars before meals and at bedtime 11.  GERD.  Continue Protonix 12.  Dyslipidemia: Continue Lipitor 13.  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  Pacemaker removed 12/14/2020 due to staph bacteremia.  Follow-up cardiology services 14.  Idiopathic pulmonary fibrosis.  Continue CPAP 15.  Atrial fibrillation.  Cardiac rate controlled, regular.  Continue Eliquis.             Monitor increase activity 16.  CAD with CABG.  No chest pain or increasing shortness of breath 17.  CKD stage II/III.    Creatinine 1.03 on 5/3  Continue to monitor 18.  Hyponatremia  Sodium 134 on 5/3  Continue to monitor 19. Pulmonary fibrosis   Last infusion on 4/26  Albuterol scheduled (?  Home  medication), discontinued by respiratory, reordered  Controlled   > 30 minutes spent in total in discharge planning between myself and PA regarding aforementioned, as well discussion regarding DME equipment, follow-up appointments, follow-up therapies, discharge medications, discharge recommendations, answering questions.  Please see discharge summary as well.  LOS: 18 days A FACE TO FACE EVALUATION WAS PERFORMED  Shane [name] Lorie Phenix 04/13/2021, 12:24 PM

## 2021-04-20 ENCOUNTER — Ambulatory Visit (INDEPENDENT_AMBULATORY_CARE_PROVIDER_SITE_OTHER): Payer: Medicare Other

## 2021-04-20 DIAGNOSIS — Z006 Encounter for examination for normal comparison and control in clinical research program: Secondary | ICD-10-CM

## 2021-04-20 DIAGNOSIS — J84112 Idiopathic pulmonary fibrosis: Secondary | ICD-10-CM

## 2021-04-20 MED ORDER — STUDY - FIBROGEN - PAMREVLUMAB 10 MG/ML IV INFUSION (OPEN LABEL) (PI-RAMASWAMY)
30.0000 mg/kg | Freq: Once | INTRAVENOUS | Status: AC
  Administered 2021-04-20: 3010 mg via INTRAVENOUS
  Filled 2021-04-20: qty 301

## 2021-04-20 NOTE — Progress Notes (Addendum)
Diagnosis: Research   Provider:  Brand Males  Procedure: Infusion  IV Type: Peripheral, IV Location: R Forearm  Pamrevlumab, Dose: 3010mg   Infusion Start Time: 6004  Infusion Stop Time: 5997  Post Infusion IV Care: Observation period completed and Peripheral IV Discontinued  Discharge: Condition: Good, Destination: Home . AVS provided to patient.   Performed by:  Arnoldo Morale, RN

## 2021-04-20 NOTE — Research (Signed)
Title: [id number] Open-Label Extension (OLE) is a multi-center, single-arm, open-label extension (OLE) phase where subjects who complete the Week 48 visit of the main study are eligible to participate. The extension to the main study allows for the continued evaluation of the efficacy and safety of 30 mg/kg IV infusions of pamrevlumab administered every 3 weeks for 52 weeks in subjects with Idiopathic Pulmonary Fibrosis.  Primary endpoint is: To provide continued access to pamrevlumab in subjects with IPF who completed the Week 48 visit of the main study  Protocol #: [id number], Clinical Trials #: [id number] Sponsor: [email or web address]  ([place], [place], [place])  Scientist, physiological / Electrical engineer note : This visit for Subject Shane [name] with DOB: 1941/09/27 on 04/20/2021 for the above protocol is Visit/Encounter #EX Week 75 and is for purpose of research.   The consent for this encounter is under Protocol VersionAmendment 5.0 (Dated 26AUG2021), Investigator Brochure Edition version 19 dated05 NOV 2021Consent Version4.0, revised 21JUL2021andiscurrently IRB approved.  Subject expressed continued interest and consent in continuing as a study subject. Subject confirmed that there was no change in contact information (e.g. address, telephone, email). Subject thanked for participation in research and contribution to science.  In this visit 04/20/2021 the subject is not required to be evaluated by an investigator.   During this visit on 04/20/2021, all study procedures were completed per the above mentioned protocol. Subject tolerated the infusion well. Please refer to the subject's paper source binder for further details of this visit. Subject will meet with research in approximately three weeks for his next scheduled infusion.   Signed by Babbie Assistant [employer]  [place], [place] 5:01 PM 04/20/2021

## 2021-04-23 ENCOUNTER — Ambulatory Visit (INDEPENDENT_AMBULATORY_CARE_PROVIDER_SITE_OTHER): Payer: Medicare Other

## 2021-04-23 ENCOUNTER — Encounter: Payer: Self-pay | Admitting: Family

## 2021-04-23 ENCOUNTER — Ambulatory Visit (INDEPENDENT_AMBULATORY_CARE_PROVIDER_SITE_OTHER): Payer: Medicare Other | Admitting: Family

## 2021-04-23 DIAGNOSIS — L97521 Non-pressure chronic ulcer of other part of left foot limited to breakdown of skin: Secondary | ICD-10-CM | POA: Diagnosis not present

## 2021-04-27 MED ORDER — DOXYCYCLINE HYCLATE 100 MG PO TABS: 100.0000 mg | ORAL_TABLET | Freq: Two times a day (BID) | ORAL | 0 refills | Status: DC

## 2021-04-28 ENCOUNTER — Telehealth: Payer: Self-pay | Admitting: Orthopedic Surgery

## 2021-04-28 NOTE — Telephone Encounter (Signed)
Pts wife called stating the pt had an appt on 04/23/21 and was told to call back if the pts L leg wasn't looking any better and they would get scheduled with Dr. Sharol Given. [name] would like to get the earliest appt she can with Dr. Sharol Given on 05/04/21 as she thinks his leg might be getting worst and I would need a slot opened.

## 2021-04-28 NOTE — Telephone Encounter (Signed)
I opened a 1pm appt slot but please advise t if looking worse should come in tomorrow.

## 2021-04-28 NOTE — Progress Notes (Signed)
Office Visit Note   Patient: Shane [name]           Date of Birth: Jul 11, 1941           MRN: [id number] Visit Date: 04/23/2021              Requested by: Burnard Bunting, MD [address] [place],  [place] [address] PCP: Burnard Bunting, MD  Chief Complaint  Patient presents with  . Right Leg - Routine Post Op    03/17/21 right BKA. Pt was admitted to CIR after surgery and was d/c about 10 days ago. [facility] comes to the home several times a week. C/o phantom limb pain at times taking Neurontin 300 mg TID. Shrinker and limb protector in place. Both pt and wife verbalize having all needed DME in the home and being followed by PCP Dr. [name]. Very satisfied with his rehab and feels like he is making great progress. Staples intact, well approximated incision. Autumn Forrest, RMA,CWCA  . Left Foot - Wound Check    GT wound treating with Santyl and bandaid.       HPI: The patient is a 80 year old gentleman seen today for 2 separate issues.  He is status post right below-knee amputation April 13 his staples remain in place he is continuing to use his limb protector wearing a shrinker around-the-clock.  He has been discharged home from inpatient rehab he is using Neurontin 3 times a day for his phantom pain.  He has a separate concern of a wound to the medial border of his left great toe the toe is red and swollen he has been using Santyl and a Band-Aid over the wound he has seen little improvement in the wound  Denies any fever or chills no nausea no constitutional symptoms  Assessment & Plan: Visit Diagnoses:  1. Ulcer of toe of left foot, limited to breakdown of skin ([place])     Plan: Staples harvested today without incident the right residual limb is healing well he will continue with his shrinker may proceed with prosthesis set up.  Have asked that they discontinue the Santyl of the great toe ulcer they will use mupirocin or antibacterial ointment if this continues with redness  on Monday will consider starting oral antibiotics at this point not concerned for infection.  Radiographs were reassuring today no sign of osteo-.  Follow-Up Instructions: Return in about 3 weeks (around 05/14/2021).   Ortho Exam  Patient is alert, oriented, no adenopathy, well-dressed, normal affect, normal respiratory effort. On examination of the right residual limb there are staples in place his below-knee amputation is well-healed this is consolidating well there is no drainage no erythema no warmth.  the left toe unfortunately has some edema and some erythema just around the ulcerative area of the ulcer is 1 cm in diameter this is filled and 100% with fibrinous exudative tissue there is slight maceration on the plantar aspect of his great toe no warmth no sausage digit swelling  Imaging: No results found. No images are attached to the encounter.  Labs: Lab Results  Component Value Date   HGBA1C 7.8 (H) 02/15/2021   HGBA1C 6.6 (H) 12/09/2020   HGBA1C 7.6 (H) 03/28/2020   ESRSEDRATE 22 (H) 10/14/2016   CRP 8.3 (H) 12/09/2020   CRP 4.1 (H) 07/17/2020   REPTSTATUS 02/22/2021 FINAL 02/19/2021   GRAMSTAIN  02/19/2021    RARE WBC PRESENT, PREDOMINANTLY PMN NO ORGANISMS SEEN    CULT  02/19/2021  FEW ESCHERICHIA COLI NO ANAEROBES ISOLATED Performed at [facility], [place] [address], [place], [place] [address]    LABORGA ESCHERICHIA COLI 02/19/2021     Lab Results  Component Value Date   ALBUMIN 2.2 (L) 03/29/2021   ALBUMIN 2.6 (L) 02/15/2021   ALBUMIN 2.0 (L) 12/16/2020    Lab Results  Component Value Date   MG 1.9 03/26/2021   MG 2.0 03/21/2021   MG 1.9 02/20/2021   No results found for: VD25OH  No results found for: PREALBUMIN CBC EXTENDED Latest Ref Rng & Units 04/06/2021 03/29/2021 03/26/2021  WBC 4.0 - 10.5 K/uL 6.7 7.4 7.9  RBC 4.22 - 5.81 MIL/uL 3.54(L) 3.12(L) 2.85(L)  HGB 13.0 - 17.0 g/dL 9.8(L) 8.8(L) 8.0(L)  HCT 39.0 - 52.0 % 31.3(L) 27.6(L) 25.4(L)   PLT 150 - 400 K/uL 303 320 292  NEUTROABS 1.7 - 7.7 K/uL 3.4 4.3 4.2  LYMPHSABS 0.7 - 4.0 K/uL 1.8 1.7 2.0     There is no height or weight on file to calculate BMI.  Orders:  Orders Placed This Encounter  Procedures  . XR Toe Great Left   Meds ordered this encounter  Medications  . doxycycline (VIBRA-TABS) 100 MG tablet    Sig: Take 1 tablet (100 mg total) by mouth 2 (two) times daily.    Dispense:  60 tablet    Refill:  0     Procedures: No procedures performed  Clinical Data: No additional findings.  ROS:  All other systems negative, except as noted in the HPI. Review of Systems  Objective: Vital Signs: There were no vitals taken for this visit.  Specialty Comments:  No specialty comments available.  PMFS History: Patient Active Problem List   Diagnosis Date Noted  . Post-operative pain   . CKD (chronic kidney disease), stage II   . Labile blood glucose   . Pulmonary fibrosis ([place])   . Hyponatremia   . 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[place]) 03/26/2021  . S/P BKA (below knee amputation) unilateral, right ([place])   . Shortness of breath   . Acute blood loss anemia   . Stage 3 chronic kidney disease ([place])   . Atrial fibrillation ([place])   . Controlled type 2 diabetes mellitus with hyperglycemia, with long-term current use of insulin ([place])   . Postoperative pain   . Postoperative hemorrhagic shock 03/20/2021  . Atherosclerosis of native arteries of extremities with gangrene, right leg ([place])   . Hardware complicating wound infection ([place])   . Abscess of bursa of right ankle 02/15/2021  . 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 Peripheral vascular disease ([place]) 12/29/2020  . CHF (congestive heart failure), NYHA class II, chronic, diastolic ([place]) 82/42/3536  . History of COVID-19 12/22/2020  . SSS (sick sinus syndrome) ([place]) 12/22/2020  . Acute bacterial endocarditis   . MSSA bacteremia 12/09/2020  . Diabetic foot ulcer ([place]) 12/09/2020  . Foot drop, right foot   . Generalized  weakness 12/08/2020  . Dehydration with hyponatremia 12/08/2020  . Atrial fibrillation, chronic ([place]) 12/08/2020  . Elevated troponin level not due myocardial infarction 12/08/2020  . Adrenal insufficiency ([place]) 11/14/2020  . Orthostatic hypotension 11/14/2020  . Physical deconditioning 11/14/2020  . Difficulty sleeping 11/07/2020  . Postoperative anemia due to acute blood loss 11/07/2020  . Right ventricular dysfunction 11/05/2020  . 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 Hearing loss 11/03/2020  . Cardiac device in situ 09/09/2020  . Coronary atherosclerosis due to lipid rich plaque 09/02/2020  . COVID-19 virus infection 07/18/2020  .  Coronary artery disease involving native heart without angina pectoris 07/10/2020  . Chronic kidney disease, stage 3a ([place]) 03/29/2020  . 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 ([place]) 03/16/2020  . Right epiretinal membrane 03/16/2020  . Posterior vitreous detachment of right eye 03/16/2020  . Aortic stenosis, moderate 03/12/2020  . 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 Near syncope 04/27/2018  . Hypoglycemia due to insulin 01/06/2018  . Essential hypertension 01/06/2018  . Diabetes mellitus type 2, with complication, on long term insulin pump ([place]) 01/06/2018  . Syncope and collapse 01/05/2018  . Encounter for therapeutic drug monitoring 06/29/2017  . OSA on CPAP 05/05/2017  . 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[place]) 01/05/2017  . Abnormal chest x-ray 10/12/2016  . Benign neoplasm of colon 01/14/2013   Past Medical History:  Diagnosis Date  . Anxiety   . 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 Cataract   . Chronic kidney disease    d/t DM  . CKD (chronic kidney disease), stage III ([place])   . Depression   . Diabetes mellitus    Vgo disposal insulin  bolus  simular to insulin pump  . Dyspnea   . GERD (gastroesophageal reflux disease)   . History of kidney stones    passed  . Hyperlipidemia   . Hypertension   . Idiopathic pulmonary fibrosis ([place]) 11/2016  . ILD (interstitial lung disease) ([place])   . Moderate aortic stenosis    a. 10/2019 Echo: EF 55-60%, Gr2 DD. Nl RV.   Marland Kitchen Neuromuscular disorder ([place])   . Neuropathy associated with endocrine disorder ([place])   . Nonobstructive CAD (coronary artery disease)    a. 2012 Cath: mod, nonobs dzs; b. 10/2016 MV: EF 60%, no ischemia.  . OSA on CPAP 05/05/2017   Unattended Home Sleep Test 7/2/813-AHI 38.6/hour, desaturation to 64%, body weight 261 pounds  . PONV (postoperative nausea and vomiting)   . Sleep apnea     uses cpap asked to bring mask and tubing    Family History  Problem Relation Age of Onset  . Diabetes Mellitus II Mother   . Emphysema Father 59  . Heart attack Father   . Colon cancer Neg Hx   . Esophageal cancer Neg Hx   . Rectal cancer Neg Hx   . Stomach cancer Neg Hx     Past Surgical History:  Procedure Laterality Date  . ABDOMINAL AORTOGRAM W/LOWER EXTREMITY N/A 12/10/2020   Procedure: ABDOMINAL AORTOGRAM W/LOWER EXTREMITY;  Surgeon: Cherre Robins, MD;  Location: [facility];  Service: Cardiovascular;  Laterality: N/A;  . AMPUTATION Right 01/22/2021   Procedure: 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;  Surgeon: [name], MD;  Location: [facility];  Service: Orthopedics;  Laterality: Right;  . AMPUTATION Right 03/17/2021   Procedure: RIGHT BELOW KNEE AMPUTATION;  Surgeon: [name], MD;  Location: [facility];  Service: Orthopedics;  Laterality: Right;  . ANKLE FUSION Right 01/22/2021   Procedure: RIGHT FOOT TIBIOCALCANEAL FUSION;  Surgeon: [name], MD;  Location: [facility];  Service: Orthopedics;  Laterality: Right;  . ANTERIOR FUSION CERVICAL SPINE  2012  . CARDIAC CATHETERIZATION  2011  . CARDIAC CATHETERIZATION N/A 11/09/2016   Procedure: Right Heart Cath;  Surgeon:  Belva Crome, MD;  Location: [facility];  Service: Cardiovascular;  Laterality: N/A;  . carpel tunnel     left wrist  . CATARACT EXTRACTION    . CATARACT EXTRACTION W/ INTRAOCULAR LENS  IMPLANT, BILATERAL  2013  . CERVICAL LAMINECTOMY  2012  . COLONOSCOPY N/A 01/14/2013   Procedure: COLONOSCOPY;  Surgeon: Irene Shipper, MD;  Location: [facility];  Service: Endoscopy;  Laterality: N/A;  . CORONARY ARTERY BYPASS GRAFT  11/04/2020   LIMA-LAD, SVG-OM1, SVG-PDA (Dr [facility]) dc 11/18/2020  . EYE SURGERY    . I & D EXTREMITY Right 02/19/2021   Procedure: RIGHT ANKLE DEBRIDEMENT AND PLACEMENT ANTIBIOTIC BEADS;  Surgeon: [name], MD;  Location: [facility];  Service: Orthopedics;  Laterality: Right;  . [place]   left  . LEFT HEART CATH AND CORONARY ANGIOGRAPHY N/A 07/10/2020   Procedure: LEFT HEART CATH AND CORONARY ANGIOGRAPHY;  Surgeon: Sherren Mocha, MD;  Location: [facility];  Service: Cardiovascular;  Laterality: N/A;  . LUMBAR LAMINECTOMY  2003  . LUNG BIOPSY Left 12/26/2016   Procedure: LUNG BIOPSY;  Surgeon: [name], MD;  Location: [facility];  Service: Thoracic;  Laterality: Left;  . PACEMAKER IMPLANT N/A 03/30/2020   Procedure: PACEMAKER IMPLANT;  Surgeon: Evans Lance, MD;  Location: [facility];  Service: Cardiovascular;  Laterality: N/A;  . PERIPHERAL VASCULAR INTERVENTION Right 12/10/2020   Procedure: PERIPHERAL VASCULAR INTERVENTION;  Surgeon: Cherre Robins, MD;  Location: [facility];  Service: Cardiovascular;  Laterality: Right;  SFA  . POSTERIOR FUSION CERVICAL SPINE  2012  . PPM GENERATOR REMOVAL N/A 12/14/2020   Procedure: PPM GENERATOR REMOVAL;  Surgeon: Evans Lance, MD;  Location: [facility];  Service: Cardiovascular;  Laterality: N/A;  . TEE WITHOUT CARDIOVERSION N/A 12/11/2020   Procedure: TRANSESOPHAGEAL ECHOCARDIOGRAM (TEE);  Surgeon: Geralynn Rile, MD;  Location: [facility];  Service:  Cardiovascular;  Laterality: N/A;  . TRIGGER FINGER RELEASE  2011   4th finger left hand  . VIDEO ASSISTED THORACOSCOPY Left 12/26/2016   Procedure: VIDEO ASSISTED THORACOSCOPY;  Surgeon: [name], MD;  Location: [facility];  Service: Thoracic;  Laterality: Left;  Marland Kitchen VIDEO BRONCHOSCOPY N/A 12/26/2016   Procedure: VIDEO BRONCHOSCOPY;  Surgeon: [name], MD;  Location: [facility];  Service: Thoracic;  Laterality: N/A;   Social History   Occupational History  . Not on file  Tobacco Use  . Smoking status: Never Smoker  . Smokeless tobacco: Never Used  Vaping Use  . Vaping Use: Never used  Substance and Sexual Activity  . Alcohol use: No  . Drug use: No  . Sexual activity: Not Currently

## 2021-04-29 NOTE — Telephone Encounter (Signed)
Called and got pt scheduled and also advised him that if at any point it started looking worse to call and get an appt and pt stated understanding.

## 2021-05-04 ENCOUNTER — Ambulatory Visit (INDEPENDENT_AMBULATORY_CARE_PROVIDER_SITE_OTHER): Payer: Medicare Other | Admitting: Orthopedic Surgery

## 2021-05-04 DIAGNOSIS — Z89511 Acquired absence of right leg below knee: Secondary | ICD-10-CM

## 2021-05-06 ENCOUNTER — Ambulatory Visit (INDEPENDENT_AMBULATORY_CARE_PROVIDER_SITE_OTHER): Payer: Medicare Other

## 2021-05-06 ENCOUNTER — Encounter: Payer: Medicare Other | Admitting: Physical Medicine and Rehabilitation

## 2021-05-06 DIAGNOSIS — J84112 Idiopathic pulmonary fibrosis: Secondary | ICD-10-CM

## 2021-05-06 DIAGNOSIS — Z006 Encounter for examination for normal comparison and control in clinical research program: Secondary | ICD-10-CM

## 2021-05-06 MED ORDER — STUDY - FIBROGEN - PAMREVLUMAB 10 MG/ML IV INFUSION (OPEN LABEL) (PI-RAMASWAMY)
30.0000 mg/kg | Freq: Once | INTRAVENOUS | Status: AC
  Administered 2021-05-06: 3010 mg via INTRAVENOUS
  Filled 2021-05-06: qty 301

## 2021-05-06 NOTE — Progress Notes (Signed)
Diagnosis: Research  Provider:  Marshell Garfinkel, MD  Procedure: Infusion  IV Type: Peripheral, IV Location: L Antecubital  Fibrogen, Dose: 3010mg   Infusion Start Time: 1525  Infusion Stop Time: 9295  Post Infusion IV Care: Observation period completed and Peripheral IV Discontinued  Discharge: Condition: Good, Destination: Home . AVS provided to patient.   Performed by:  Koren Shiver, RN

## 2021-05-06 NOTE — Research (Signed)
Title: [id number] Open-Label Extension (OLE) is a multi-center, single-arm, open-label extension (OLE) phase where subjects who complete the Week 48 visit of the main study are eligible to participate. The extension to the main study allows for the continued evaluation of the efficacy and safety of 30 mg/kg IV infusions of pamrevlumab administered every 3 weeks for 52 weeks in subjects with Idiopathic Pulmonary Fibrosis.  Primary endpoint is: To provide continued access to pamrevlumab in subjects with IPF who completed the Week 48 visit of the main study  Protocol #: [id number], Clinical Trials #: [id number] Sponsor: [email or web address]  ([place], [place], [place])  Scientist, physiological / Electrical engineer note : This visit for Subject Shane [name] with DOB: [birth year] on 05/06/2021 for the above protocol is Visit/Encounter #EX week 68  and is for purpose of research.   The consent for this encounter is under Protocol VersionAmendment 5.0 (Dated 26AUG2021), Investigator Brochure Edition version 19 dated05 NOV 2021Consent Version4.0, revised 21JUL2021andiscurrently IRB approved.  Subject expressed continued interest and consent in continuing as a study subject. Subject confirmed that there was no change in contact information (e.g. address, telephone, email). Subject thanked for participation in research and contribution to science.  In this visit 05/06/2021 the subject is not required to be evaluated by an investigator.  All Ex week 78 procedures and assessments were performed per the above mentioned protocol. Subject tolerated infusion well. Subject will return in approximately 3 weeks for his next research visit. Please see subject's paper source binder for further details of this visit.  Signed by Gwendolyn Grant Clinical Research Coordinator / Nurse Horatio Pel, [place] 4:50 PM 05/06/2021

## 2021-05-07 NOTE — Progress Notes (Signed)
Virtual Visit via Video Note   This visit type was conducted due to national recommendations for restrictions regarding the COVID-19 Pandemic (e.g. social distancing) in an effort to limit this patient's exposure and mitigate transmission in our community.  Due to his co-morbid illnesses, this patient is at least at moderate risk for complications without adequate follow up.  This format is felt to be most appropriate for this patient at this time.  All issues noted in this document were discussed and addressed.  A limited physical exam was performed with this format.  Please refer to the patient's chart for his consent to telehealth for [facility].      Date:  05/20/2021   ID:  Shane [name], DOB 09-12-1941, MRN [id number]  Patient Location:Home Provider Location: Home  PCP:  Burnard Bunting, MD  Cardiologist:  Dr Stanford Breed  Evaluation Performed:  Follow-Up Visit  Chief Complaint:  FU CAD  History of Present Illness:    FU dyspnea/CAD. Patient has interstitial lung disease. Patient had lung biopsy on January 22. Pathology consistent with usual interstitial pneumonia. Cardiac catheterization August 2021 showed severe three-vessel coronary artery disease with 50 to 70% left main, 70% mid LAD, 70% apical LAD and 90% distal, 80% circumflex, proximal 90% RCA and 75% distal.  Patient was seen by Dr. Cyndia Bent and coronary artery bypass and graft was offered but patient wanted to consider.  Ultimately he had coronary artery bypass and graft at [facility] December 2021 (LIMA to the LAD, saphenous vein graft to the first obtuse marginal and saphenous vein graft to the PDA).  Carotid Dopplers February 2022 showed 1 to 39% bilateral stenosis. Echocardiogram April 2022 showed ejection fraction 50 to 55%, moderate left ventricular hypertrophy, mild right ventricular enlargement, moderate RV dysfunction, mild to moderate left atrial enlargement, moderate right atrial enlargement, mild mitral  regurgitation, moderate aortic stenosis with mean gradient 21 mmHg, mildly dilated ascending aorta at 41 mm.  Patient also with history of paroxysmal atrial fibrillation/flutter.  Since last seen, he denies dyspnea, chest pain, palpitations or syncope.  The patient does not have symptoms concerning for COVID-19 infection (fever, chills, cough, or new shortness of breath).    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 10/2019 Echo: EF 55-60%, Gr2 DD. Nl RV.    Neuromuscular disorder ([place])    Neuropathy associated with endocrine disorder ([place])    Nonobstructive CAD (coronary artery disease)    a. 2012 Cath: mod, nonobs dzs; b. 10/2016 MV: EF 60%, no ischemia.   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;  Surgeon: [name], MD;  Location: [place];  Service: Orthopedics;  Laterality: Right;   AMPUTATION Right 03/17/2021   Procedure: RIGHT BELOW KNEE AMPUTATION;  Surgeon: [name], MD;  Location: [place];  Service:  Orthopedics;  Laterality: Right;   ANKLE FUSION Right 01/22/2021   Procedure: RIGHT FOOT TIBIOCALCANEAL FUSION;  Surgeon: [name], MD;  Location: [place];  Service: Orthopedics;  Laterality: Right;   ANTERIOR FUSION CERVICAL SPINE  2012   CARDIAC CATHETERIZATION  2011   CARDIAC CATHETERIZATION N/A 11/09/2016   Procedure: Right Heart Cath;  Surgeon: Belva Crome, MD;  Location: [place] [facility];  Service: Cardiovascular;  Laterality: N/A;   carpel tunnel     left wrist   CATARACT EXTRACTION     CATARACT EXTRACTION W/ INTRAOCULAR LENS  IMPLANT, BILATERAL  2013   CERVICAL LAMINECTOMY  2012   COLONOSCOPY N/A 01/14/2013   Procedure: COLONOSCOPY;  Surgeon: Irene Shipper, MD;  Location: [facility];  Service: Endoscopy;  Laterality: N/A;   CORONARY ARTERY BYPASS GRAFT  11/04/2020   LIMA-LAD, SVG-OM1, SVG-PDA (Dr Marney [facility]) dc 11/18/2020   EYE SURGERY     I & D EXTREMITY Right 02/19/2021   Procedure: RIGHT ANKLE DEBRIDEMENT AND PLACEMENT ANTIBIOTIC BEADS;  Surgeon: [name], MD;  Location: [place];  Service: Orthopedics;  Laterality: Right;   KNEE SURGERY  1998   left   LEFT HEART CATH AND CORONARY ANGIOGRAPHY N/A 07/10/2020   Procedure: LEFT HEART CATH AND CORONARY ANGIOGRAPHY;  Surgeon: Sherren Mocha, MD;  Location: [place] [facility];  Service: Cardiovascular;  Laterality: N/A;   LUMBAR LAMINECTOMY  2003   LUNG BIOPSY Left 12/26/2016   Procedure: LUNG BIOPSY;  Surgeon: [name], MD;  Location: [place];  Service: Thoracic;  Laterality: Left;   PACEMAKER IMPLANT N/A 03/30/2020   Procedure: PACEMAKER IMPLANT;  Surgeon: Evans Lance, MD;  Location: [place] [facility];  Service: Cardiovascular;  Laterality: N/A;   PERIPHERAL VASCULAR INTERVENTION Right 12/10/2020   Procedure: PERIPHERAL VASCULAR INTERVENTION;  Surgeon: Cherre Robins, MD;  Location: [place] [facility];  Service: Cardiovascular;  Laterality: Right;  SFA   POSTERIOR FUSION CERVICAL SPINE  2012   PPM  GENERATOR REMOVAL N/A 12/14/2020   Procedure: PPM GENERATOR REMOVAL;  Surgeon: Evans Lance, MD;  Location: [place] [facility];  Service: Cardiovascular;  Laterality: N/A;   TEE WITHOUT CARDIOVERSION N/A 12/11/2020   Procedure: TRANSESOPHAGEAL ECHOCARDIOGRAM (TEE);  Surgeon: Geralynn Rile, MD;  Location: [place];  Service: Cardiovascular;  Laterality: N/A;   TRIGGER FINGER RELEASE  2011   4th finger left hand   VIDEO ASSISTED THORACOSCOPY Left 12/26/2016   Procedure: VIDEO ASSISTED THORACOSCOPY;  Surgeon: [name], MD;  Location: [place];  Service: Thoracic;  Laterality: Left;   VIDEO BRONCHOSCOPY N/A 12/26/2016   Procedure: VIDEO BRONCHOSCOPY;  Surgeon: [name], MD;  Location[facility];  Service: Thoracic;  Laterality: N/A;     Current Meds  Medication Sig   acetaminophen (TYLENOL) 325 MG tablet Take 1-2 tablets (325-650 mg total) by mouth every 6 (six) hours as needed for mild pain (pain score 1-3 or temp > 100.5).   albuterol (VENTOLIN HFA) 108 (90 Base) MCG/ACT inhaler Inhale  2 puffs into the lungs every 4 (four) hours as needed for wheezing or shortness of breath.   apixaban (ELIQUIS) 5 MG TABS tablet Take 1 tablet (5 mg total) by mouth 2 (two) times daily.   Ascorbic Acid (VITAMIN C WITH ROSE HIPS) 500 MG tablet Take 1 tablet (500 mg total) by mouth daily.   atorvastatin (LIPITOR) 10 MG tablet Take 2 tablets (20 mg total) by mouth 2 (two) times daily.   docusate sodium (COLACE) 100 MG capsule Take 1 capsule (100 mg total) by mouth daily.   doxycycline (VIBRA-TABS) 100 MG tablet Take 1 tablet (100 mg total) by mouth 2 (two) times daily.   escitalopram (LEXAPRO) 10 MG tablet Take 1 tablet (10 mg total) by mouth daily.   furosemide (LASIX) 40 MG tablet Take 1 tablet (40 mg total) by mouth daily.   gabapentin (NEURONTIN) 300 MG capsule Take 1 capsule (300 mg total) by mouth 3 (three) times daily.   insulin aspart (NOVOLOG FLEXPEN) 100 UNIT/ML FlexPen Inject 6  Units into the skin 3 (three) times daily with meals.   insulin glargine (LANTUS) 100 UNIT/ML Solostar Pen Inject 8 Units into the skin at bedtime.   iron polysaccharides (NIFEREX) 150 MG capsule Take 1 capsule (150 mg total) by mouth daily with lunch.   LORazepam (ATIVAN) 1 MG tablet Take 1 tablet (1 mg total) by mouth at bedtime.   magnesium oxide (MAG-OX) 400 (240 Mg) MG tablet Take 1 tablet (400 mg total) by mouth daily.   metoprolol tartrate (LOPRESSOR) 50 MG tablet Take 0.5 tablets (25 mg total) by mouth daily.   Multiple Vitamin (MULTIVITAMIN WITH MINERALS) TABS tablet Take 1 tablet by mouth daily.   Olopatadine HCl (PATADAY OP) Place 1 drop into both eyes daily as needed (allergies).   pantoprazole (PROTONIX) 40 MG tablet Take 1 tablet (40 mg total) by mouth 2 (two) times daily.   polyethylene glycol powder (GLYCOLAX/MIRALAX) 17 GM/SCOOP powder Take 17 g by mouth daily as needed for moderate constipation.   Probiotic Product (PROBIOTIC PO) Take 1 capsule by mouth daily.   traMADol (ULTRAM) 50 MG tablet Take 1 tablet (50 mg total) by mouth every 8 (eight) hours as needed for severe pain or moderate pain.   Zinc Oxide (TRIPLE PASTE) 12.8 % ointment Apply 1 application topically 2 (two) times daily. Apply to affected area   zinc sulfate 220 (50 Zn) MG capsule Take 1 capsule (220 mg total) by mouth daily.     Allergies:   Codeine, Ofev [nintedanib], and Pirfenidone   Social History   Tobacco Use   Smoking status: Never   Smokeless tobacco: Never  Vaping Use   Vaping Use: Never used  Substance Use Topics   Alcohol use: No   Drug use: No     Family Hx: The patient's family history includes Diabetes Mellitus II in his mother; Emphysema (age of onset: 60) in his father; Heart attack in his father. There is no history of Colon cancer, Esophageal cancer, Rectal cancer, or Stomach cancer.  ROS:   Please see the history of present illness.    Patient does have infection involving his  left foot by report and is scheduled for an arteriogram.  No Fever, chills  or productive cough All other systems reviewed and are negative.   Recent Labs: 12/08/2020: B Natriuretic Peptide 195.4 12/09/2020: TSH 1.928 03/26/2021: Magnesium 1.9 03/29/2021: ALT 9 04/06/2021: BUN 19; Creatinine, Ser 1.03; Hemoglobin 9.8; Platelets 303; Potassium 4.3; Sodium 134  Recent Lipid Panel Lab Results  Component Value Date/Time   CHOL  02/04/2011 05:30 AM    127        ATP III CLASSIFICATION:  <200     mg/dL   Desirable  200-239  mg/dL   Borderline High  >=240    mg/dL   High          TRIG 329 (H) 07/17/2020 02:56 PM   HDL 29 (L) 02/04/2011 05:30 AM   CHOLHDL 4.4 02/04/2011 05:30 AM   LDLCALC  02/04/2011 05:30 AM    58        Total Cholesterol/HDL:CHD Risk Coronary Heart Disease Risk Table                     Men   Women  1/2 Average Risk   3.4   3.3  Average Risk       5.0   4.4  2 X Average Risk   9.6   7.1  3 X Average Risk  23.4   11.0        Use the calculated Patient Ratio above and the CHD Risk Table to determine the patient's CHD Risk.        ATP III CLASSIFICATION (LDL):  <100     mg/dL   Optimal  100-129  mg/dL   Near or Above                    Optimal  130-159  mg/dL   Borderline  160-189  mg/dL   High  >190     mg/dL   Very High    Wt Readings from Last 3 Encounters:  05/20/21 224 lb 9.6 oz (101.9 kg)  03/26/21 228 lb 9.9 oz (103.7 kg)  03/21/21 249 lb 5.4 oz (113.1 kg)     Objective:    Vital Signs:  BP 126/64   Pulse 61   Ht 6' (1.829 m)   Wt 224 lb 9.6 oz (101.9 kg)   SpO2 99%   BMI 30.46 kg/m    VITAL SIGNS:  reviewed NAD Answers questions appropriately Normal affect Remainder of physical examination not performed (telehealth visit; coronavirus pandemic)  ASSESSMENT & PLAN:    1 coronary artery disease-status post coronary artery bypass and graft.  Patient denies chest pain.  Continue medical therapy with aspirin and statin.  2 aortic  stenosis-moderate on most recent echocardiogram.  Plan follow-up echocardiogram April 2023.  He will likely require TAVR in the future.  3 status post pacemaker-device removed previously due to infection in his foot that was felt to likely involve his pacemaker.  4 hypertension-blood pressure controlled.  Continue present medications.  Check potassium and renal function in 8 to 12 weeks.  5 hyperlipidemia-change Lipitor to 80 mg daily.  Check lipids and liver in 12 weeks.  6 history of syncope-no recurrences  7 interstitial lung disease-followed by pulmonary.  8 paroxysmal atrial fibrillation-plan to continue apixaban at present dose.  Check hemoglobin.  COVID-19 Education: The importance of social distancing was discussed today.  Time:   Today, I have spent 16 minutes with the patient with telehealth technology discussing the above problems.     Medication Adjustments/Labs and Tests Ordered: Current medicines are reviewed at length with the patient today.  Concerns regarding medicines are outlined above.   Tests Ordered: No orders of the defined types were placed in this encounter.   Medication Changes: No orders of the defined types were placed in  this encounter.   Follow Up:  In Person in 6 month(s)  Signed, Kirk Ruths, MD  05/20/2021 8:36 AM    [place]

## 2021-05-10 ENCOUNTER — Other Ambulatory Visit: Payer: Self-pay | Admitting: Internal Medicine

## 2021-05-12 ENCOUNTER — Ambulatory Visit (INDEPENDENT_AMBULATORY_CARE_PROVIDER_SITE_OTHER)
Admission: RE | Admit: 2021-05-12 | Discharge: 2021-05-12 | Disposition: A | Discharge status: Home / Self Care | Payer: Medicare Other | Source: Ambulatory Visit | Attending: Adult Health | Admitting: Adult Health

## 2021-05-12 ENCOUNTER — Ambulatory Visit (HOSPITAL_COMMUNITY)
Admission: RE | Admit: 2021-05-12 | Discharge: 2021-05-12 | Disposition: A | Discharge status: Home / Self Care | Payer: Medicare Other | Source: Ambulatory Visit | Attending: Adult Health | Admitting: Adult Health

## 2021-05-12 ENCOUNTER — Other Ambulatory Visit: Payer: Self-pay

## 2021-05-12 DIAGNOSIS — I739 Peripheral vascular disease, unspecified: Secondary | ICD-10-CM | POA: Diagnosis not present

## 2021-05-13 ENCOUNTER — Encounter: Payer: Medicare Other | Admitting: Orthopedic Surgery

## 2021-05-18 ENCOUNTER — Ambulatory Visit (INDEPENDENT_AMBULATORY_CARE_PROVIDER_SITE_OTHER): Payer: Medicare Other | Admitting: Orthopedic Surgery

## 2021-05-18 DIAGNOSIS — Z89511 Acquired absence of right leg below knee: Secondary | ICD-10-CM

## 2021-05-18 DIAGNOSIS — L97521 Non-pressure chronic ulcer of other part of left foot limited to breakdown of skin: Secondary | ICD-10-CM

## 2021-05-20 ENCOUNTER — Encounter: Payer: Self-pay | Admitting: Cardiology

## 2021-05-20 ENCOUNTER — Telehealth (INDEPENDENT_AMBULATORY_CARE_PROVIDER_SITE_OTHER): Payer: Medicare Other | Admitting: Cardiology

## 2021-05-20 VITALS — BP 126/64 | HR 61 | Ht 72.0 in | Wt 224.6 lb

## 2021-05-20 DIAGNOSIS — I480 Paroxysmal atrial fibrillation: Secondary | ICD-10-CM | POA: Diagnosis not present

## 2021-05-20 DIAGNOSIS — I10 Essential (primary) hypertension: Secondary | ICD-10-CM

## 2021-05-20 DIAGNOSIS — E785 Hyperlipidemia, unspecified: Secondary | ICD-10-CM | POA: Diagnosis not present

## 2021-05-20 DIAGNOSIS — I350 Nonrheumatic aortic (valve) stenosis: Secondary | ICD-10-CM

## 2021-05-20 DIAGNOSIS — I25118 Atherosclerotic heart disease of native coronary artery with other forms of angina pectoris: Secondary | ICD-10-CM | POA: Diagnosis not present

## 2021-05-20 MED ORDER — ATORVASTATIN CALCIUM 80 MG PO TABS: 80.0000 mg | ORAL_TABLET | Freq: Every day | ORAL | 3 refills | Status: DC

## 2021-05-20 NOTE — Patient Instructions (Signed)
Medication Instructions:   INCREASE ATORVASTATIN TO 80 MG ONCE DAILY= 8 OF THE 10 MG TABLETS ONCE DAILY  *If you need a refill on your cardiac medications before your next appointment, please call your pharmacy*   Lab Work:  Your physician recommends that you return for lab work in: 3 MONTHS =FASTING  If you have labs (blood work) drawn today and your tests are completely normal, you will receive your results only by: [place] (if you have MyChart) OR A paper copy in the mail If you have any lab test that is abnormal or we need to change your treatment, we will call you to review the results.   Testing/Procedures:  Your physician has requested that you have an echocardiogram. Echocardiography is a painless test that uses sound waves to create images of your heart. It provides your doctor with information about the size and shape of your heart and how well your heart's chambers and valves are working. This procedure takes approximately one hour. There are no restrictions for this procedure. [place] April 2023   Follow-Up: At [facility], you and your health needs are our priority.  As part of our continuing mission to provide you with exceptional heart care, we have created designated Provider Care Teams.  These Care Teams include your primary Cardiologist (physician) and Advanced Practice Providers (APPs -  Physician Assistants and Nurse Practitioners) who all work together to provide you with the care you need, when you need it.  We recommend signing up for the patient portal called "MyChart".  Sign up information is provided on this After Visit Summary.  MyChart is used to connect with patients for Virtual Visits (Telemedicine).  Patients are able to view lab/test results, encounter notes, upcoming appointments, etc.  Non-urgent messages can be sent to your provider as well.   To learn more about what you can do with MyChart, go to  [email or web address].    Your next appointment:   6 month(s)  The format for your next appointment:   In Person  Provider:   Kirk Ruths, MD

## 2021-05-24 ENCOUNTER — Telehealth: Payer: Self-pay | Admitting: Internal Medicine

## 2021-05-24 NOTE — Telephone Encounter (Signed)
Yes this is fine   Xxxx Lilli Few, CMA  Brand Males, MD sent via staff message 6/18 Mrs. Dumlao called and stated that patient is scheduled to have an arteriogram on June 21st at [facility] as an outpatient. He is scheduled to have his next research infusion on June 23rd. Spouse wanted to be sure it was ok for the patient to receive the research infusion 2 days post procedure.I advised her that I thought it would be fine to proceed as long as the patient felt up to it, but that I will send a message to Dr. Chase Caller just to be sure. Please advise if ok.   Thanks

## 2021-05-27 ENCOUNTER — Encounter: Payer: Self-pay | Admitting: Orthopedic Surgery

## 2021-05-27 ENCOUNTER — Ambulatory Visit (INDEPENDENT_AMBULATORY_CARE_PROVIDER_SITE_OTHER): Payer: Medicare Other

## 2021-05-27 ENCOUNTER — Encounter: Payer: Medicare Other | Admitting: *Deleted

## 2021-05-27 ENCOUNTER — Other Ambulatory Visit: Payer: Self-pay

## 2021-05-27 DIAGNOSIS — Z006 Encounter for examination for normal comparison and control in clinical research program: Secondary | ICD-10-CM

## 2021-05-27 DIAGNOSIS — J84112 Idiopathic pulmonary fibrosis: Secondary | ICD-10-CM

## 2021-05-27 MED ORDER — STUDY - FIBROGEN - PAMREVLUMAB 10 MG/ML IV INFUSION (OPEN LABEL) (PI-RAMASWAMY)
30.0000 mg/kg | Freq: Once | INTRAVENOUS | Status: AC
  Administered 2021-05-27: 3010 mg via INTRAVENOUS
  Filled 2021-05-27: qty 301

## 2021-05-27 NOTE — Progress Notes (Signed)
Office Visit Note   Patient: Shane [name]           Date of Birth: 08/25/1941           MRN: [id number] Visit Date: 05/04/2021              Requested by: Burnard Bunting, MD [address] [place],  [place] [address] PCP: Burnard Bunting, MD  Chief Complaint  Patient presents with   Right Leg - Routine Post Op    03/17/21 right BKA      HPI: Patient is an 80 year old gentleman who is 6 weeks status post right transtibial amputation is currently wearing a 4 extra-large stump shrinker  Assessment & Plan: Visit Diagnoses:  1. Hx of right BKA ([place])     Plan: Recommended patient decrease to a 3 XL and possibly to XL shrinker.  Follow-Up Instructions: Return in about 2 weeks (around 05/18/2021).   Ortho Exam  Patient is alert, oriented, no adenopathy, well-dressed, normal affect, normal respiratory effort. Examination patient does have increased swelling patient complains of muscle spasm in the limb recommend he try coconut water to help with electrolyte replacement he does have some phantom pain at night he is using Neurontin 3 times a day.  Patient's physical therapy is on hold until his prosthesis is fabricated.  Examination patient does have pitting edema and the left lower extremity the ulcer on the fifth toe is healed the great toe ulcer is much improved.  Imaging: No results found. No images are attached to the encounter.  Labs: Lab Results  Component Value Date   HGBA1C 7.8 (H) 02/15/2021   HGBA1C 6.6 (H) 12/09/2020   HGBA1C 7.6 (H) 03/28/2020   ESRSEDRATE 22 (H) 10/14/2016   CRP 8.3 (H) 12/09/2020   CRP 4.1 (H) 07/17/2020   REPTSTATUS 02/22/2021 FINAL 02/19/2021   GRAMSTAIN  02/19/2021    RARE WBC PRESENT, PREDOMINANTLY PMN NO ORGANISMS SEEN    CULT  02/19/2021    FEW ESCHERICHIA COLI NO ANAEROBES ISOLATED Performed at [facility], [place] [address] [place], [place] [address]    LABORGA ESCHERICHIA COLI 02/19/2021     Lab Results  Component  Value Date   ALBUMIN 2.2 (L) 03/29/2021   ALBUMIN 2.6 (L) 02/15/2021   ALBUMIN 2.0 (L) 12/16/2020    Lab Results  Component Value Date   MG 1.9 03/26/2021   MG 2.0 03/21/2021   MG 1.9 02/20/2021   No results found for: VD25OH  No results found for: PREALBUMIN CBC EXTENDED Latest Ref Rng & Units 04/06/2021 03/29/2021 03/26/2021  WBC 4.0 - 10.5 K/uL 6.7 7.4 7.9  RBC 4.22 - 5.81 MIL/uL 3.54(L) 3.12(L) 2.85(L)  HGB 13.0 - 17.0 g/dL 9.8(L) 8.8(L) 8.0(L)  HCT 39.0 - 52.0 % 31.3(L) 27.6(L) 25.4(L)  PLT 150 - 400 K/uL 303 320 292  NEUTROABS 1.7 - 7.7 K/uL 3.4 4.3 4.2  LYMPHSABS 0.7 - 4.0 K/uL 1.8 1.7 2.0     There is no height or weight on file to calculate BMI.  Orders:  No orders of the defined types were placed in this encounter.  No orders of the defined types were placed in this encounter.    Procedures: No procedures performed  Clinical Data: No additional findings.  ROS:  All other systems negative, except as noted in the HPI. Review of Systems  Objective: Vital Signs: There were no vitals taken for this visit.  Specialty Comments:  No specialty comments available.  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[place]) 03/26/2021   S/P BKA (below knee amputation) unilateral, right (HCC)    Shortness of breath    Acute blood loss anemia    Stage 3 chronic kidney disease (HCC)    Atrial fibrillation ([place])    Controlled type 2 diabetes mellitus with hyperglycemia, with long-term current use of insulin (HCC)    Postoperative pain    Postoperative hemorrhagic shock 03/20/2021   Atherosclerosis of native arteries of extremities with gangrene, right leg ([place])    Hardware complicating wound infection ([place])    Abscess of bursa of right ankle 02/15/2021   Endocarditis 01/19/2021    Peripheral vascular disease ([place]) 12/29/2020   CHF (congestive heart failure), NYHA class II, chronic, diastolic ([place]) 12/45/8099   History of COVID-19 12/22/2020   SSS (sick sinus syndrome) ([place]) 12/22/2020   Acute bacterial endocarditis    MSSA bacteremia 12/09/2020   Diabetic foot ulcer ([place]) 12/09/2020   Foot drop, right foot    Generalized weakness 12/08/2020   Dehydration with hyponatremia 12/08/2020   Atrial fibrillation, chronic ([place]) 12/08/2020   Elevated troponin level not due myocardial infarction 12/08/2020   Adrenal insufficiency ([place]) 11/14/2020   Orthostatic hypotension 11/14/2020   Physical deconditioning 11/14/2020   Difficulty sleeping 11/07/2020   Postoperative anemia due to acute blood loss 11/07/2020   Right ventricular dysfunction 11/05/2020   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/04/2020   Hearing loss 11/03/2020   Cardiac device in situ 09/09/2020   Coronary atherosclerosis due to lipid rich plaque 09/02/2020   COVID-19 virus infection 07/18/2020   Coronary artery disease involving native heart without angina pectoris 07/10/2020   Chronic kidney disease, stage 3a ([place]) 03/29/2020   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/28/2020   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 ([place]) 03/16/2020   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 ([place]) 03/16/2020   Right epiretinal membrane 03/16/2020   Posterior vitreous detachment of right eye 03/16/2020   Aortic stenosis, moderate 03/12/2020   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   Near syncope 04/27/2018   Hypoglycemia due to insulin 01/06/2018   Essential hypertension 01/06/2018   Diabetes mellitus type 2, with complication, on long term insulin pump ([place]) 01/06/2018   Syncope and collapse 01/05/2018   Encounter for therapeutic drug monitoring 06/29/2017   OSA on CPAP 05/05/2017   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[place]) 01/05/2017    Abnormal chest x-ray 10/12/2016   Benign neoplasm of colon 01/14/2013   Past Medical History:  Diagnosis Date   Anxiety    AV block, Mobitz 1    Cataract    Chronic kidney disease    d/t DM   CKD (chronic kidney disease), stage III ([place])    Depression    Diabetes mellitus    Vgo disposal insulin bolus  simular to insulin pump   Dyspnea    GERD (gastroesophageal reflux disease)    History of kidney stones    passed   Hyperlipidemia    Hypertension    Idiopathic pulmonary fibrosis ([place]) 11/2016   ILD (interstitial lung disease) ([place])    Moderate aortic stenosis    a. 10/2019 Echo: EF 55-60%, Gr2 DD. Nl RV.    Neuromuscular  disorder ([place])    Neuropathy associated with endocrine disorder ([place])    Nonobstructive CAD (coronary artery disease)    a. 2012 Cath: mod, nonobs dzs; b. 10/2016 MV: EF 60%, no ischemia.   OSA on CPAP 05/05/2017   Unattended Home Sleep Test 7/2/813-AHI 38.6/hour, desaturation to 64%, body weight 261 pounds   PONV (postoperative nausea and vomiting)    Sleep apnea     uses cpap asked to bring mask and tubing    Family History  Problem Relation Age of Onset   Diabetes Mellitus II Mother    Emphysema Father 21   Heart attack Father    Colon cancer Neg Hx    Esophageal cancer Neg Hx    Rectal cancer Neg Hx    Stomach cancer Neg Hx     Past Surgical History:  Procedure Laterality Date   ABDOMINAL AORTOGRAM W/LOWER EXTREMITY N/A 12/10/2020   Procedure: ABDOMINAL AORTOGRAM W/LOWER EXTREMITY;  Surgeon: Cherre Robins, MD;  Location: [facility];  Service: Cardiovascular;  Laterality: N/A;   AMPUTATION Right 01/22/2021   Procedure: 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;  Surgeon: [name], MD;  Location: [place];  Service: Orthopedics;  Laterality: Right;   AMPUTATION Right 03/17/2021   Procedure: RIGHT BELOW KNEE AMPUTATION;  Surgeon: [name], MD;  Location: [facility];  Service: Orthopedics;  Laterality: Right;   ANKLE FUSION Right 01/22/2021   Procedure:  RIGHT FOOT TIBIOCALCANEAL FUSION;  Surgeon: [name], MD;  Location: [facility];  Service: Orthopedics;  Laterality: Right;   ANTERIOR FUSION CERVICAL SPINE  2012   CARDIAC CATHETERIZATION  2011   CARDIAC CATHETERIZATION N/A 11/09/2016   Procedure: Right Heart Cath;  Surgeon: Belva Crome, MD;  Location: [facility];  Service: Cardiovascular;  Laterality: N/A;   carpel tunnel     left wrist   CATARACT EXTRACTION     CATARACT EXTRACTION W/ INTRAOCULAR LENS  IMPLANT, BILATERAL  2013   CERVICAL LAMINECTOMY  2012   COLONOSCOPY N/A 01/14/2013   Procedure: COLONOSCOPY;  Surgeon: Irene Shipper, MD;  [facility];  Service: Endoscopy;  Laterality: N/A;   CORONARY ARTERY BYPASS GRAFT  11/04/2020   LIMA-LAD, SVG-OM1, SVG-PDA (Dr Marney Setting [facility]) dc 11/18/2020   EYE SURGERY     I & D EXTREMITY Right 02/19/2021   Procedure: RIGHT ANKLE DEBRIDEMENT AND PLACEMENT ANTIBIOTIC BEADS;  Surgeon: [name], MD;  Location: [facility];  Service: Orthopedics;  Laterality: Right;   KNEE SURGERY  1998   left   LEFT HEART CATH AND CORONARY ANGIOGRAPHY N/A 07/10/2020   Procedure: LEFT HEART CATH AND CORONARY ANGIOGRAPHY;  Surgeon: Sherren Mocha, MD;  Location: [facility];  Service: Cardiovascular;  Laterality: N/A;   LUMBAR LAMINECTOMY  2003   LUNG BIOPSY Left 12/26/2016   Procedure: LUNG BIOPSY;  Surgeon: [name], MD;  Location: [place];  Service: Thoracic;  Laterality: Left;   PACEMAKER IMPLANT N/A 03/30/2020   Procedure: PACEMAKER IMPLANT;  Surgeon: Evans Lance, MD;  Location: [facility];  Service: Cardiovascular;  Laterality: N/A;   PERIPHERAL VASCULAR INTERVENTION Right 12/10/2020   Procedure: PERIPHERAL VASCULAR INTERVENTION;  Surgeon: Cherre Robins, MD;  Location: [facility];  Service: Cardiovascular;  Laterality: Right;  SFA   POSTERIOR FUSION CERVICAL SPINE  2012   PPM GENERATOR REMOVAL N/A 12/14/2020   Procedure: PPM GENERATOR REMOVAL;  Surgeon:  Evans Lance, MD;  Location: [facility];  Service: Cardiovascular;  Laterality: N/A;   TEE WITHOUT CARDIOVERSION N/A 12/11/2020   Procedure: TRANSESOPHAGEAL ECHOCARDIOGRAM (TEE);  Surgeon: Geralynn Rile, MD;  Location: [facility];  Service: Cardiovascular;  Laterality: N/A;   [place]   4th finger left hand   VIDEO ASSISTED THORACOSCOPY Left 12/26/2016   Procedure: VIDEO ASSISTED THORACOSCOPY;  Surgeon: [name], MD;  Location: [facility];  Service: Thoracic;  Laterality: Left;   VIDEO BRONCHOSCOPY N/A 12/26/2016   Procedure: VIDEO BRONCHOSCOPY;  Surgeon: [name], MD;  Location: [facility];  Service: Thoracic;  Laterality: N/A;   Social History   Occupational History   Not on file  Tobacco Use   Smoking status: Never   Smokeless tobacco: Never  Vaping Use   Vaping Use: Never used  Substance and Sexual Activity   Alcohol use: No   Drug use: No   Sexual activity: Not Currently

## 2021-05-27 NOTE — Progress Notes (Signed)
Diagnosis: Research  Provider:  Marshell Garfinkel, MD  Procedure: Infusion  IV Type: Peripheral, IV Location: R Forearm  Fibrogen, Dose: 3,010 mg  Infusion Start Time: 3437  Infusion Stop Time: 3578  Post Infusion IV Care: Observation period completed and Peripheral IV Discontinued  Discharge: Condition: Good, Destination: Home . AVS provided to patient.   Performed by:  Koren Shiver, RN

## 2021-05-28 ENCOUNTER — Encounter: Payer: Self-pay | Admitting: Orthopedic Surgery

## 2021-05-28 NOTE — Research (Signed)
Late Entry Visit note:   Title: FGCL-3019-091 (FibroGen Study) is a Phase 3, randomized, double-blind, placebo-controlled multicenter international study to evaluate evaluate the efficacy and safety of 30 mg/kg IV infusions of pamrevlumab administered every 3 weeks for 52 weeks as compared to placebo in subjects with Idiopathic Pulmonary Fibrosis. Primary end point is: change in FVC from baseline at week 52. ZEPHYRUS STUDY  Protocol #: FGCL-3019-091, Clinical Trials #: [id number] Sponsor: www.fibrogen.com  ([place], [place], [place])  [employer] @ [employer] Coordinator note :   This visit for Subject Shane [name] with DOB: February 25, 1941 on 05/27/2021 for the above protocol is Visit/Encounter # EX Week 81 OLE  and is for purpose of research.   The consent for this encounter is under Protocol Version Protocol Amendment 5.0 (dated 26Aug2021)  , Investigator Brochure Edition  version 19 dated [phone] Consent Version version 4.0, revised 21jul2021 and is currently IRB approved.   Subject expressed continued interest and consent in continuing as a study subject. Subject confirmed that there was no change in contact information (e.g. address, telephone, email). Subject thanked for participation in research and contribution to science.  This research coordinator has verified that the above investigator is up to date with his/her training logs.    All procedures and assessments completed per the above stated protocol. Subject tolerated infusion well without complaints. Refer to the subjects paper source binder for further details of the subjects visit.    Signed by  [place], CMA, BS, [place] Coordinator  [place], [place] 11:02 AM 05/28/2021

## 2021-05-28 NOTE — Progress Notes (Signed)
Office Visit Note   Patient: Shane [name]           Date of Birth: [birth year]           MRN: [id number] Visit Date: 05/18/2021              Requested by: Burnard Bunting, MD [address] [place],  [place] [address] PCP: Burnard Bunting, MD  Chief Complaint  Patient presents with   Right Leg - Routine Post Op    03/17/21 right BKA    Left Foot - Follow-up    GT ulcer       HPI: Patient is a 80 year old gentleman who presents for 2 separate issues.  He is 2 months status post a right below the knee amputation he is wearing his shrinker he is on Neurontin 3 times a day he states he wakes up at night with his foot hurting.  Patient is also seen for great toe ulcer on the left foot patient complains of redness and swelling and a spot of drainage.  Assessment & Plan: Visit Diagnoses:  1. Hx of right BKA ([place])   2. Ulcer of toe of left foot, limited to breakdown of skin ([place])     Plan: Patient will follow-up with [facility] for prosthetic fitting he has an arteriogram next week and will follow-up in the office in 4 weeks.  Follow-Up Instructions: Return in about 4 weeks (around 06/15/2021).   Ortho Exam  Patient is alert, oriented, no adenopathy, well-dressed, normal affect, normal respiratory effort. Examination the right transtibial amputation is well-healed.  Patient will continue with the stump shrinker and proceed with prosthetic fitting.  Patient recently has been evaluated by vascular vein surgery ankle-brachial indices on June 8.  Imaging: No results found. No images are attached to the encounter.  Labs: Lab Results  Component Value Date   HGBA1C 7.8 (H) 02/15/2021   HGBA1C 6.6 (H) 12/09/2020   HGBA1C 7.6 (H) 03/28/2020   ESRSEDRATE 22 (H) 10/14/2016   CRP 8.3 (H) 12/09/2020   CRP 4.1 (H) 07/17/2020   REPTSTATUS 02/22/2021 FINAL 02/19/2021   GRAMSTAIN  02/19/2021    RARE WBC PRESENT, PREDOMINANTLY PMN NO ORGANISMS SEEN    CULT  02/19/2021    FEW  ESCHERICHIA COLI NO ANAEROBES ISOLATED Performed at [facility], [place] [address], [place], [place] [address]    LABORGA ESCHERICHIA COLI 02/19/2021     Lab Results  Component Value Date   ALBUMIN 2.2 (L) 03/29/2021   ALBUMIN 2.6 (L) 02/15/2021   ALBUMIN 2.0 (L) 12/16/2020    Lab Results  Component Value Date   MG 1.9 03/26/2021   MG 2.0 03/21/2021   MG 1.9 02/20/2021   No results found for: VD25OH  No results found for: PREALBUMIN CBC EXTENDED Latest Ref Rng & Units 04/06/2021 03/29/2021 03/26/2021  WBC 4.0 - 10.5 K/uL 6.7 7.4 7.9  RBC 4.22 - 5.81 MIL/uL 3.54(L) 3.12(L) 2.85(L)  HGB 13.0 - 17.0 g/dL 9.8(L) 8.8(L) 8.0(L)  HCT 39.0 - 52.0 % 31.3(L) 27.6(L) 25.4(L)  PLT 150 - 400 K/uL 303 320 292  NEUTROABS 1.7 - 7.7 K/uL 3.4 4.3 4.2  LYMPHSABS 0.7 - 4.0 K/uL 1.8 1.7 2.0     There is no height or weight on file to calculate BMI.  Orders:  No orders of the defined types were placed in this encounter.  No orders of the defined types were placed in this encounter.    Procedures: No procedures performed  Clinical  Data: No additional findings.  ROS:  All other systems negative, except as noted in the HPI. Review of Systems  Objective: Vital Signs: There were no vitals taken for this visit.  Specialty Comments:  No specialty comments available.  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[place]) 03/26/2021   S/P BKA (below knee amputation) unilateral, right (HCC)    Shortness of breath    Acute blood loss anemia    Stage 3 chronic kidney disease (HCC)    Atrial fibrillation ([place])    Controlled type 2 diabetes mellitus with hyperglycemia, with long-term current use of insulin (HCC)    Postoperative pain    Postoperative hemorrhagic shock 03/20/2021   Atherosclerosis of  native arteries of extremities with gangrene, right leg ([place])    Hardware complicating wound infection ([place])    Abscess of bursa of right ankle 02/15/2021   Endocarditis 01/19/2021   Peripheral vascular disease ([place]) 12/29/2020   CHF (congestive heart failure), NYHA class II, chronic, diastolic ([place]) 78/67/5449   History of COVID-19 12/22/2020   SSS (sick sinus syndrome) ([place]) 12/22/2020   Acute bacterial endocarditis    MSSA bacteremia 12/09/2020   Diabetic foot ulcer ([place]) 12/09/2020   Foot drop, right foot    Generalized weakness 12/08/2020   Dehydration with hyponatremia 12/08/2020   Atrial fibrillation, chronic ([place]) 12/08/2020   Elevated troponin level not due myocardial infarction 12/08/2020   Adrenal insufficiency ([place]) 11/14/2020   Orthostatic hypotension 11/14/2020   Physical deconditioning 11/14/2020   Difficulty sleeping 11/07/2020   Postoperative anemia due to acute blood loss 11/07/2020   Right ventricular dysfunction 11/05/2020   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/04/2020   Hearing loss 11/03/2020   Cardiac device in situ 09/09/2020   Coronary atherosclerosis due to lipid rich plaque 09/02/2020   COVID-19 virus infection 07/18/2020   Coronary artery disease involving native heart without angina pectoris 07/10/2020   Chronic kidney disease, stage 3a ([place]) 03/29/2020   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/28/2020   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 ([place]) 03/16/2020   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 ([place]) 03/16/2020   Right epiretinal membrane 03/16/2020   Posterior vitreous detachment of right eye 03/16/2020   Aortic stenosis, moderate 03/12/2020   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   Near syncope 04/27/2018   Hypoglycemia due to insulin 01/06/2018   Essential hypertension 01/06/2018   Diabetes mellitus type 2, with complication, on long term insulin pump  ([place]) 01/06/2018   Syncope and collapse 01/05/2018   Encounter for therapeutic drug monitoring 06/29/2017   OSA on CPAP 05/05/2017   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[place]) 01/05/2017   Abnormal chest x-ray 10/12/2016   Benign neoplasm of colon 01/14/2013   Past Medical History:  Diagnosis Date   Anxiety    AV block, Mobitz 1    Cataract    Chronic kidney disease    d/t DM   CKD (chronic kidney disease), stage III ([place])    Depression    Diabetes mellitus    Vgo disposal insulin bolus  simular to insulin pump   Dyspnea    GERD (gastroesophageal reflux disease)    History of kidney stones  passed   Hyperlipidemia    Hypertension    Idiopathic pulmonary fibrosis ([place]) 11/2016   ILD (interstitial lung disease) ([place])    Moderate aortic stenosis    a. 10/2019 Echo: EF 55-60%, Gr2 DD. Nl RV.    Neuromuscular disorder ([place])    Neuropathy associated with endocrine disorder ([place])    Nonobstructive CAD (coronary artery disease)    a. 2012 Cath: mod, nonobs dzs; b. 10/2016 MV: EF 60%, no ischemia.   OSA on CPAP 05/05/2017   Unattended Home Sleep Test 7/2/813-AHI 38.6/hour, desaturation to 64%, body weight 261 pounds   PONV (postoperative nausea and vomiting)    Sleep apnea     uses cpap asked to bring mask and tubing    Family History  Problem Relation Age of Onset   Diabetes Mellitus II Mother    Emphysema Father 41   Heart attack Father    Colon cancer Neg Hx    Esophageal cancer Neg Hx    Rectal cancer Neg Hx    Stomach cancer Neg Hx     Past Surgical History:  Procedure Laterality Date   ABDOMINAL AORTOGRAM W/LOWER EXTREMITY N/A 12/10/2020   Procedure: ABDOMINAL AORTOGRAM W/LOWER EXTREMITY;  Surgeon: Cherre Robins, MD;  Location: [facility];  Service: Cardiovascular;  Laterality: N/A;   AMPUTATION Right 01/22/2021   Procedure: 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;  Surgeon: [name], MD;  Location: [facility];  Service: Orthopedics;  Laterality: Right;   AMPUTATION Right  03/17/2021   Procedure: RIGHT BELOW KNEE AMPUTATION;  Surgeon: [name], MD;  Location: [facility];  Service: Orthopedics;  Laterality: Right;   ANKLE FUSION Right 01/22/2021   Procedure: RIGHT FOOT TIBIOCALCANEAL FUSION;  Surgeon: [name], MD;  Location: [facility];  Service: Orthopedics;  Laterality: Right;   ANTERIOR FUSION CERVICAL SPINE  2012   CARDIAC CATHETERIZATION  2011   CARDIAC CATHETERIZATION N/A 11/09/2016   Procedure: Right Heart Cath;  Surgeon: Belva Crome, MD;  Location: [facility];  Service: Cardiovascular;  Laterality: N/A;   carpel tunnel     left wrist   CATARACT EXTRACTION     CATARACT EXTRACTION W/ INTRAOCULAR LENS  IMPLANT, BILATERAL  2013   CERVICAL LAMINECTOMY  2012   COLONOSCOPY N/A 01/14/2013   Procedure: COLONOSCOPY;  Surgeon: Irene Shipper, MD;  [facility];  Service: Endoscopy;  Laterality: N/A;   CORONARY ARTERY BYPASS GRAFT  11/04/2020   LIMA-LAD, SVG-OM1, SVG-PDA (Dr Marney Setting [facility]) dc 11/18/2020   EYE SURGERY     I & D EXTREMITY Right 02/19/2021   Procedure: RIGHT ANKLE DEBRIDEMENT AND PLACEMENT ANTIBIOTIC BEADS;  Surgeon: [name], MD;  Location: [place] [facility];  Service: Orthopedics;  Laterality: Right;   KNEE SURGERY  1998   left   LEFT HEART CATH AND CORONARY ANGIOGRAPHY N/A 07/10/2020   Procedure: LEFT HEART CATH AND CORONARY ANGIOGRAPHY;  Surgeon: Sherren Mocha, MD;  Location: [facility];  Service: Cardiovascular;  Laterality: N/A;   LUMBAR LAMINECTOMY  2003   LUNG BIOPSY Left 12/26/2016   Procedure: LUNG BIOPSY;  Surgeon: [name], MD;  Location: [facility];  Service: Thoracic;  Laterality: Left;   PACEMAKER IMPLANT N/A 03/30/2020   Procedure: PACEMAKER IMPLANT;  Surgeon: Evans Lance, MD;  Location: [facility];  Service: Cardiovascular;  Laterality: N/A;   PERIPHERAL VASCULAR INTERVENTION Right 12/10/2020   Procedure: PERIPHERAL VASCULAR INTERVENTION;  Surgeon: Cherre Robins, MD;  Location: [facility];  Service: Cardiovascular;  Laterality: Right;  SFA   POSTERIOR FUSION CERVICAL SPINE  2012   PPM GENERATOR REMOVAL N/A 12/14/2020   Procedure: PPM GENERATOR REMOVAL;  Surgeon: Evans Lance, MD;  Location: [facility];  Service: Cardiovascular;  Laterality: N/A;   TEE WITHOUT CARDIOVERSION N/A 12/11/2020   Procedure: TRANSESOPHAGEAL ECHOCARDIOGRAM (TEE);  Surgeon: Geralynn Rile, MD;  Location: [facility];  Service: Cardiovascular;  Laterality: N/A;   [place]   4th finger left hand   VIDEO ASSISTED THORACOSCOPY Left 12/26/2016   Procedure: VIDEO ASSISTED THORACOSCOPY;  Surgeon: [name], MD;  Location: [facility];  Service: Thoracic;  Laterality: Left;   VIDEO BRONCHOSCOPY N/A 12/26/2016   Procedure: VIDEO BRONCHOSCOPY;  Surgeon: [name], MD;  Location: [facility];  Service: Thoracic;  Laterality: N/A;   Social History   Occupational History   Not on file  Tobacco Use   Smoking status: Never   Smokeless tobacco: Never  Vaping Use   Vaping Use: Never used  Substance and Sexual Activity   Alcohol use: No   Drug use: No   Sexual activity: Not Currently

## 2021-05-31 ENCOUNTER — Other Ambulatory Visit: Payer: Self-pay | Admitting: Family

## 2021-06-01 ENCOUNTER — Other Ambulatory Visit: Payer: Self-pay | Admitting: Physician Assistant

## 2021-06-01 ENCOUNTER — Telehealth: Payer: Self-pay | Admitting: Orthopedic Surgery

## 2021-06-01 MED ORDER — GABAPENTIN 300 MG PO CAPS: ORAL_CAPSULE | ORAL | 0 refills | Status: DC

## 2021-06-01 NOTE — Telephone Encounter (Signed)
Pt called asking for a refill of his gabapentin rx; he is having a prosthetic fitting on 06/02/21 and is still having spasms. Pt would like to have this called in today if possible and would like to be notified when its been called in.   [phone]

## 2021-06-01 NOTE — Telephone Encounter (Signed)
done

## 2021-06-02 ENCOUNTER — Telehealth: Payer: Self-pay | Admitting: Orthopedic Surgery

## 2021-06-02 NOTE — Telephone Encounter (Signed)
Angel from [facility] called. She says patient has a 1cm pressure sore on his stump area. Also she is concerned about the  L greater toe not healing from a sore. Her call back number is [phone].

## 2021-06-03 ENCOUNTER — Telehealth: Payer: Self-pay | Admitting: Orthopedic Surgery

## 2021-06-03 NOTE — Telephone Encounter (Signed)
It is up to the pt. The [facility] had concerns about its appearance and so that's why I wanted to make the appt. If they are comfortable and want to wait that is ok. If they want to move the appt up to next week that is ok to. Whatever they would like to do if you do not mind calling?

## 2021-06-03 NOTE — Telephone Encounter (Signed)
I CB to ask pt what he wanted to do and both numbers went to VM so I will try again right before lunch.

## 2021-06-03 NOTE — Telephone Encounter (Signed)
I tried to call pt and vm says not to leave a vm and to continue to try and call. I was going to offer the pt and appt this afternoon to re-eval his GT. Can you please continue to try and reach out to pt's wife for appt?

## 2021-06-03 NOTE — Telephone Encounter (Signed)
I called pt and his wife [name] answered; she states the pt went to see his primary Dr. On 06/02/21 and he has started treating the pts toe. [name] would like to know if Dr. Sharol Given wants to see the pt still or just wait for 06/21/21 appt?

## 2021-06-09 ENCOUNTER — Encounter (INDEPENDENT_AMBULATORY_CARE_PROVIDER_SITE_OTHER): Payer: Self-pay

## 2021-06-10 ENCOUNTER — Encounter: Payer: Medicare Other | Admitting: *Deleted

## 2021-06-10 ENCOUNTER — Other Ambulatory Visit: Payer: Self-pay

## 2021-06-10 ENCOUNTER — Ambulatory Visit (INDEPENDENT_AMBULATORY_CARE_PROVIDER_SITE_OTHER): Payer: Medicare Other

## 2021-06-10 DIAGNOSIS — Z006 Encounter for examination for normal comparison and control in clinical research program: Secondary | ICD-10-CM

## 2021-06-10 DIAGNOSIS — J8410 Pulmonary fibrosis, unspecified: Secondary | ICD-10-CM

## 2021-06-10 DIAGNOSIS — J84112 Idiopathic pulmonary fibrosis: Secondary | ICD-10-CM

## 2021-06-10 MED ORDER — STUDY - FIBROGEN - PAMREVLUMAB 10 MG/ML IV INFUSION (OPEN LABEL) (PI-RAMASWAMY)
30.0000 mg/kg | Freq: Once | INTRAVENOUS | Status: AC
Start: 2021-06-10 — End: 2021-06-10
  Administered 2021-06-10: 3010 mg via INTRAVENOUS
  Filled 2021-06-10: qty 301

## 2021-06-10 NOTE — Research (Signed)
Title: FGCL-3019-091 (FibroGen Study) is a Phase 3, randomized, double-blind, placebo-controlled multicenter international study to evaluate evaluate the efficacy and safety of 30 mg/kg IV infusions of pamrevlumab administered every 3 weeks for 52 weeks as compared to placebo in subjects with Idiopathic Pulmonary Fibrosis. Primary end point is: change in FVC from baseline at week 52. ZEPHYRUS STUDY  Protocol #: [id number], Clinical Trials #: [id number] Sponsor: [email or web address]  ([place], [place], [place])  [employer] @ [employer] Coordinator note :   This visit for Subject Shane [name] with DOB: 08/08/1941 on 06/10/2021 for the above protocol is Visit/Encounter # EX Week 84 OLE  and is for purpose of research.   The consent for this encounter is under Protocol Version  Protocol Amendment 5.0 (Dated 26AUG2021) , Investigator Brochure Edition version 19 dated [id number], Consent Version  version 4.0, revised 21JUL2021 and is currently IRB approved.   Subject expressed continued interest and consent in continuing as a study subject. Subject confirmed that there was no change in contact information (e.g. address, telephone, email). Subject thanked for participation in research and contribution to science.  The Subject was  informed that the PI  continues to have oversight of the subject's visits and course  through relevant discussions, reviews and also specifically of this visit by routing of this note to the PI.  Infusion tolerated well without complaints. All procedures and assessments completed per the above mentioned protocol. Refer to the subjects source binder for further details of the visit.    Signed by [place] Bing, CMA, BS, [facility] Coordinator  [place], [place] 4:37 PM 06/10/2021

## 2021-06-10 NOTE — Progress Notes (Signed)
Diagnosis: Idiopathic Pulmonary Fibrosis (IPF)  Provider:  Marshell Garfinkel, MD  Procedure: Infusion  IV Type: Peripheral, IV Location: R Forearm  FIBROGEN (Pamrevlumab), Dose: 3010 mg  Infusion Start Time: 1025  Infusion Stop Time: 4862  Post Infusion IV Care: Observation period completed  Discharge: Condition: Good, Destination: Home. AVS provided to patient.   Performed by:  Paul Dykes, RN

## 2021-06-11 NOTE — Progress Notes (Signed)
Diagnosis: Idiopathic Pulmonary Fibrosis (IPF)  Provider:  Marshell Garfinkel, MD  Procedure: Infusion  IV Type: Peripheral, IV Location: R Forearm  FIBROGEN (Pamrevlumab), Dose: 3010 mg  Infusion Start Time: 4967  Infusion Stop Time: 5916  Post Infusion IV Care: Observation period completed  Discharge: Condition: Good, Destination: Home. AVS provided to patient.   Performed by:  Jonelle Sidle, RN

## 2021-06-21 ENCOUNTER — Ambulatory Visit: Payer: Medicare Other | Admitting: Orthopedic Surgery

## 2021-06-29 ENCOUNTER — Encounter (INDEPENDENT_AMBULATORY_CARE_PROVIDER_SITE_OTHER): Payer: Medicare Other | Admitting: Ophthalmology

## 2021-07-02 ENCOUNTER — Telehealth: Payer: Self-pay | Admitting: *Deleted

## 2021-07-02 NOTE — Telephone Encounter (Signed)
-----   Message from Brand Males, MD sent at 05/21/2021  4:08 PM EDT ----- Regarding: RE: [place] Subject Upcoming Procedure Yeah this is fine ----- Message ----- From: Lilli Few, CMA Sent: 05/21/2021   2:13 PM EDT To: Brand Males, MD Subject: [place]er[place] Subject Upcoming Procedure            Mrs. Brau called and stated that patient is scheduled to have an arteriogram on June 21st at [facility] as an outpatient. He is scheduled to have his next research infusion on June 23rd. Spouse wanted to be sure it was ok for the patient to receive the research infusion 2 days post procedure.I advised her that I thought it would be fine to proceed as long as the patient felt up to it, but that I will send a message to Dr. Chase Caller just to be sure. Please advise if ok.   Thanks

## 2021-07-06 ENCOUNTER — Ambulatory Visit: Payer: Medicare Other

## 2021-07-14 ENCOUNTER — Encounter: Payer: Medicare Other | Admitting: *Deleted

## 2021-07-14 ENCOUNTER — Ambulatory Visit: Payer: Medicare Other

## 2021-07-14 ENCOUNTER — Other Ambulatory Visit: Payer: Self-pay

## 2021-07-14 ENCOUNTER — Ambulatory Visit (INDEPENDENT_AMBULATORY_CARE_PROVIDER_SITE_OTHER): Payer: Medicare Other

## 2021-07-14 VITALS — BP 110/70 | HR 77 | Temp 98.0°F | Resp 19

## 2021-07-14 DIAGNOSIS — J84112 Idiopathic pulmonary fibrosis: Secondary | ICD-10-CM

## 2021-07-14 DIAGNOSIS — Z006 Encounter for examination for normal comparison and control in clinical research program: Secondary | ICD-10-CM

## 2021-07-14 DIAGNOSIS — J8410 Pulmonary fibrosis, unspecified: Secondary | ICD-10-CM

## 2021-07-14 MED ORDER — STUDY - FIBROGEN - PAMREVLUMAB 10 MG/ML IV INFUSION (OPEN LABEL) (PI-RAMASWAMY)
30.0000 mg/kg | Freq: Once | INTRAVENOUS | Status: AC
  Administered 2021-07-14: 3010 mg via INTRAVENOUS
  Filled 2021-07-14: qty 301

## 2021-07-14 NOTE — Progress Notes (Signed)
Diagnosis: Idiopathic Pulmonary Fibrosis (IPF)  Provider:  Marshell Garfinkel, MD  Procedure: Infusion  IV Type: Peripheral, IV Location: R Antecubital  FIBROGEN (Pamrevlumab), Dose: 3010mg   Infusion Start Time: 0937  Infusion Stop Time: 1015  Post Infusion IV Care: Observation period completed and Peripheral IV Discontinued  Discharge: Condition: Good, Destination: Home . AVS provided to patient.   Performed by:  Koren Shiver, RN

## 2021-07-14 NOTE — Research (Signed)
  Title: FGCL-3019-091 Open-Label Extension (OLE) is a multi-center, single-arm, open-label extension (OLE) phase where subjects who complete the Week 48 visit of the main study are eligible to participate. The extension to the main study allows for the continued evaluation of the efficacy and safety of 30 mg/kg IV infusions of pamrevlumab administered every 3 weeks for 52 weeks in subjects with Idiopathic Pulmonary Fibrosis.  Primary endpoint is: To provide continued access to pamrevlumab in subjects with IPF who completed the Week 48 visit of the main study  Protocol #: [id number], Clinical Trials #: [id number] Sponsor: [email or web address]  ([place], [place], [place])  [employer] @ [employer] Coordinator note :   This visit for Subject Shane [name] with DOB: [birth year] on 07/14/2021 for the above protocol is Visit/Encounter # EX Week 87  and is for purpose of research.   The consent for this encounter is under Protocol Amendment 5.0 (Dated 26AUG2021)  IB: version 19 dated 05NOV2021 Main ICF: version 7.0 revised 21JUL2021 Optional Genetic Consent: version 10Apr2020, Revised 28May2021 OLE ICF: version 4.0, revised 21JUL2021  Subject expressed continued interest and consent in continuing as a study subject. Subject confirmed that there was  no change in contact information (e.g. address, telephone, email). Subject thanked for participation in research and contribution to science.   The Subject was informed that the PI Dr. Donnie Mesa continues to have oversight of the subject's visits and course  through relevant discussions, reviews and also specifically of this visit by routing of this note to the [facility].  All procedures completed per the above mentioned protocol. Subject tolerated infusion well.    Signed by [place] Bing, CMA, BS, [facility] Coordinator [place], [place] 11:47 AM 07/14/2021

## 2021-07-22 ENCOUNTER — Other Ambulatory Visit: Payer: Self-pay

## 2021-07-22 DIAGNOSIS — Z89511 Acquired absence of right leg below knee: Secondary | ICD-10-CM

## 2021-07-23 ENCOUNTER — Other Ambulatory Visit: Payer: Self-pay | Admitting: Physician Assistant

## 2021-07-26 ENCOUNTER — Ambulatory Visit (INDEPENDENT_AMBULATORY_CARE_PROVIDER_SITE_OTHER): Payer: Medicare Other | Admitting: Ophthalmology

## 2021-07-26 ENCOUNTER — Encounter (INDEPENDENT_AMBULATORY_CARE_PROVIDER_SITE_OTHER): Payer: Self-pay | Admitting: Ophthalmology

## 2021-07-26 ENCOUNTER — Other Ambulatory Visit: Payer: Self-pay

## 2021-07-26 DIAGNOSIS — E113591 Type 2 diabetes mellitus with proliferative diabetic retinopathy without macular edema, right eye: Secondary | ICD-10-CM | POA: Diagnosis not present

## 2021-07-26 DIAGNOSIS — E1165 Type 2 diabetes mellitus with hyperglycemia: Secondary | ICD-10-CM | POA: Diagnosis not present

## 2021-07-26 DIAGNOSIS — Z794 Long term (current) use of insulin: Secondary | ICD-10-CM

## 2021-07-26 DIAGNOSIS — H35371 Puckering of macula, right eye: Secondary | ICD-10-CM

## 2021-07-26 DIAGNOSIS — E113592 Type 2 diabetes mellitus with proliferative diabetic retinopathy without macular edema, left eye: Secondary | ICD-10-CM

## 2021-07-26 NOTE — Assessment & Plan Note (Signed)
Minor epiretinal membrane nasally, no active maculopathy

## 2021-07-26 NOTE — Assessment & Plan Note (Addendum)
No active disease OD  The nature of regressed proliferative diabetic retinopathy was discussed with the patient. The patient was advised to maintain good glucose, blood pressure, monitor kidney function and serum lipid control as advised by personal physician. Rare risk for reactivation of progression exist with untreated severe anemia, untreated renal failure, untreated heart failure, and smoking. Complete avoidance of smoking was recommended. The chance of recurrent proliferative diabetic retinopathy was discussed as well as the chance of vitreous hemorrhage for which further treatments may be necessary.   Explained to the patient that the quiescent  proliferative diabetic retinopathy disease is unlikely to ever worsen.  Worsening factors would include however severe anemia, hypertension out-of-control or impending renal failure.

## 2021-07-26 NOTE — Progress Notes (Signed)
07/26/2021     CHIEF COMPLAINT Patient presents for  Chief Complaint  Patient presents with   Diabetic Eye Exam      HISTORY OF PRESENT ILLNESS: Shane [name] is a 80 y.o. male who presents to the clinic today for:   HPI     Diabetic Eye Exam           Vision: is stable and is blurred for near ("I need brighter lights to see.")   Associated Symptoms: Floaters (OS, stable.) and Photophobia.  Negative for Flashes   Diabetes Type: Type 2   Onset: 41 years ago   Blood Sugars: is controlled   Last Blood Glucose: 118   Last A1C: 6.2         Comments   1 yr fu diabetic exam (last seen 03/16/20) [facility] advised to follow up w/ Dr. Zadie Rhine. Patient states vision is stable and unchanged since last visit. Denies any new floaters or FOL.       Last edited by Laurin Coder, COA on 07/26/2021  8:55 AM.      Referring physician: Burnard Bunting, MD [address] [place],  [place] [address]  HISTORICAL INFORMATION:   Selected notes from the [facility]    Lab Results  Component Value Date   HGBA1C 7.8 (H) 02/15/2021     CURRENT MEDICATIONS: Current Outpatient Medications (Ophthalmic Drugs)  Medication Sig   Olopatadine HCl (PATADAY OP) Place 1 drop into both eyes daily as needed (allergies).   No current facility-administered medications for this visit. (Ophthalmic Drugs)   Current Outpatient Medications (Other)  Medication Sig   acetaminophen (TYLENOL) 325 MG tablet Take 1-2 tablets (325-650 mg total) by mouth every 6 (six) hours as needed for mild pain (pain score 1-3 or temp > 100.5).   albuterol (VENTOLIN HFA) 108 (90 Base) MCG/ACT inhaler Inhale 2 puffs into the lungs every 4 (four) hours as needed for wheezing or shortness of breath.   apixaban (ELIQUIS) 5 MG TABS tablet Take 1 tablet (5 mg total) by mouth 2 (two) times daily.   Ascorbic Acid (VITAMIN C WITH ROSE HIPS) 500 MG tablet Take 1 tablet (500 mg total) by mouth daily.    atorvastatin (LIPITOR) 80 MG tablet Take 1 tablet (80 mg total) by mouth daily.   docusate sodium (COLACE) 100 MG capsule Take 1 capsule (100 mg total) by mouth daily.   doxycycline (VIBRA-TABS) 100 MG tablet Take 1 tablet (100 mg total) by mouth 2 (two) times daily.   escitalopram (LEXAPRO) 10 MG tablet Take 1 tablet (10 mg total) by mouth daily.   furosemide (LASIX) 40 MG tablet Take 1 tablet (40 mg total) by mouth daily.   gabapentin (NEURONTIN) 300 MG capsule TAKE 1 CAPSULE BY MOUTH THREE TIMES A DAY   insulin aspart (NOVOLOG FLEXPEN) 100 UNIT/ML FlexPen Inject 6 Units into the skin 3 (three) times daily with meals.   insulin glargine (LANTUS) 100 UNIT/ML Solostar Pen Inject 8 Units into the skin at bedtime.   iron polysaccharides (NIFEREX) 150 MG capsule Take 1 capsule (150 mg total) by mouth daily with lunch.   LORazepam (ATIVAN) 1 MG tablet Take 1 tablet (1 mg total) by mouth at bedtime.   magnesium oxide (MAG-OX) 400 (240 Mg) MG tablet Take 1 tablet (400 mg total) by mouth daily.   metoprolol tartrate (LOPRESSOR) 50 MG tablet Take 0.5 tablets (25 mg total) by mouth daily.   Multiple Vitamin (MULTIVITAMIN WITH MINERALS) TABS tablet  Take 1 tablet by mouth daily.   pantoprazole (PROTONIX) 40 MG tablet Take 1 tablet (40 mg total) by mouth 2 (two) times daily.   polyethylene glycol powder (GLYCOLAX/MIRALAX) 17 GM/SCOOP powder Take 17 g by mouth daily as needed for moderate constipation.   Probiotic Product (PROBIOTIC PO) Take 1 capsule by mouth daily.   traMADol (ULTRAM) 50 MG tablet Take 1 tablet (50 mg total) by mouth every 8 (eight) hours as needed for severe pain or moderate pain.   Zinc Oxide (TRIPLE PASTE) 12.8 % ointment Apply 1 application topically 2 (two) times daily. Apply to affected area   zinc sulfate 220 (50 Zn) MG capsule Take 1 capsule (220 mg total) by mouth daily.   No current facility-administered medications for this visit. (Other)      REVIEW OF  SYSTEMS:    ALLERGIES Allergies  Allergen Reactions   Codeine Hives and Itching   Ofev [Nintedanib] Diarrhea    SEVERE DIARRHEA   Pirfenidone Diarrhea and Other (See Comments)    Esbriet (Pirfenidone) causes elevated LFTs. 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 10/2019 Echo: EF 55-60%, Gr2 DD. Nl RV.    Neuromuscular disorder ([place])    Neuropathy associated with endocrine disorder ([place])    Nonobstructive CAD (coronary artery disease)    a. 2012 Cath: mod, nonobs dzs; b. 10/2016 MV: EF 60%, no ischemia.   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;  Surgeon: [name], MD;  Location: [facility];  Service: Orthopedics;  Laterality: Right;   AMPUTATION Right 03/17/2021   Procedure: RIGHT BELOW KNEE AMPUTATION;  Surgeon: [name], MD;  Location: [facility];  Service: Orthopedics;  Laterality: Right;   ANKLE FUSION Right 01/22/2021   Procedure: RIGHT FOOT  TIBIOCALCANEAL FUSION;  Surgeon: [name], MD;  Location: [facility];  Service: Orthopedics;  Laterality: Right;   ANTERIOR FUSION CERVICAL SPINE  2012   CARDIAC CATHETERIZATION  2011   CARDIAC CATHETERIZATION N/A 11/09/2016   Procedure: Right Heart Cath;  Surgeon: Belva Crome, MD;  Location: [facility];  Service: Cardiovascular;  Laterality: N/A;   carpel tunnel     left wrist   CATARACT EXTRACTION     CATARACT EXTRACTION W/ INTRAOCULAR LENS  IMPLANT, BILATERAL  2013   CERVICAL LAMINECTOMY  2012   COLONOSCOPY N/A 01/14/2013   Procedure: COLONOSCOPY;  Surgeon: Irene Shipper, MD;  Location: [facility];  Service: Endoscopy;  Laterality: N/A;   CORONARY ARTERY BYPASS GRAFT  11/04/2020   LIMA-LAD, SVG-OM1, SVG-PDA (Dr Marney [facility]) dc 11/18/2020   EYE SURGERY     I & D EXTREMITY Right 02/19/2021   Procedure: RIGHT ANKLE DEBRIDEMENT AND PLACEMENT ANTIBIOTIC BEADS;  Surgeon: [name], MD;  Location: [facility];  Service: Orthopedics;  Laterality: Right;   KNEE SURGERY  1998   left   LEFT HEART CATH AND CORONARY ANGIOGRAPHY N/A 07/10/2020   Procedure: LEFT HEART CATH AND CORONARY ANGIOGRAPHY;  Surgeon: Sherren Mocha, MD;  Location: [facility];  Service: Cardiovascular;  Laterality: N/A;   LUMBAR LAMINECTOMY  2003   LUNG BIOPSY Left 12/26/2016   Procedure: LUNG BIOPSY;  Surgeon: [name], MD;  Location: [facility];  Service: Thoracic;  Laterality: Left;   PACEMAKER IMPLANT N/A 03/30/2020   Procedure: PACEMAKER IMPLANT;  Surgeon: Evans Lance, MD;  Location: [facility];  Service: Cardiovascular;  Laterality: N/A;   PERIPHERAL VASCULAR INTERVENTION Right 12/10/2020   Procedure: PERIPHERAL VASCULAR INTERVENTION;  Surgeon: Cherre Robins, MD;  Location: [facility];  Service: Cardiovascular;  Laterality: Right;  SFA   POSTERIOR FUSION CERVICAL SPINE  2012   PPM GENERATOR REMOVAL N/A 12/14/2020   Procedure: PPM GENERATOR REMOVAL;  Surgeon: Evans Lance,  MD;  Location: [facility];  Service: Cardiovascular;  Laterality: N/A;   TEE WITHOUT CARDIOVERSION N/A 12/11/2020   Procedure: TRANSESOPHAGEAL ECHOCARDIOGRAM (TEE);  Surgeon: Geralynn Rile, MD;  Location: [facility];  Service: Cardiovascular;  Laterality: N/A;   TRIGGER FINGER RELEASE  2011   4th finger left hand   VIDEO ASSISTED THORACOSCOPY Left 12/26/2016   Procedure: VIDEO ASSISTED THORACOSCOPY;  Surgeon: [name], MD;  Location: [facility];  Service: Thoracic;  Laterality: Left;   VIDEO BRONCHOSCOPY N/A 12/26/2016   Procedure: VIDEO BRONCHOSCOPY;  Surgeon: [name], MD;  Location: [facility];  Service: Thoracic;  Laterality: N/A;    FAMILY HISTORY Family History  Problem Relation Age of Onset   Diabetes Mellitus II Mother    Emphysema Father 34   Heart attack Father    Colon cancer Neg Hx    Esophageal cancer Neg Hx    Rectal cancer Neg Hx    Stomach cancer Neg Hx     SOCIAL HISTORY Social History   Tobacco Use   Smoking status: Never   Smokeless tobacco: Never  Vaping Use   Vaping Use: Never used  Substance Use Topics   Alcohol use: No   Drug use: No         OPHTHALMIC EXAM:  Base Eye Exam     Visual Acuity (ETDRS)       Right Left   Dist [place] 20/40 -2 20/40 -2   Dist ph  20/30 +2 20/30         Tonometry (Tonopen, 8:52 AM)       Right Left   Pressure 15 13         Pupils       Pupils Dark Light Shape React APD   Right PERRL 4 4 Round Minimal None   Left PERRL 4 3 Round Brisk None         Visual Fields (Counting fingers)       Left  Right    Full Full         Extraocular Movement       Right Left    Full Full         Neuro/Psych     Oriented x3: Yes   Mood/Affect: Normal         Dilation     Both eyes: 1.0% Mydriacyl, 2.5% Phenylephrine @ 8:52 AM           Slit Lamp and Fundus Exam     External Exam       Right Left   External Normal Normal         Slit Lamp Exam        Right Left   Lids/Lashes Normal Normal   Conjunctiva/Sclera White and quiet White and quiet   Cornea Clear Clear   Anterior Chamber Deep and quiet Deep and quiet   Iris Round and reactive Round and reactive   Lens Posterior chamber intraocular lens Posterior chamber intraocular lens   Anterior Vitreous Normal Normal         Fundus Exam       Right Left   Posterior Vitreous Posterior vitreous detachment Posterior vitreous detachment   Disc Normal Normal   C/D Ratio 0.3 0.25   Macula Microaneurysms, Focal laser scars,,, no macular thickening Microaneurysms, Focal laser scars, no macular thickening   Vessels PDR QUIESCENT PDR QUIESCENT   Periphery Laser scars prp,, room nasally for prp   Laser scars , good prp 360            IMAGING AND PROCEDURES  Imaging and Procedures for 07/26/21  OCT, Retina - OU - Both Eyes       Right Eye Quality was good. Scan locations included subfoveal. Central Foveal Thickness: 298. Progression has been stable. Findings include outer retinal atrophy, central retinal atrophy, macular pucker, retinal drusen .   Left Eye Quality was good. Scan locations included subfoveal. Central Foveal Thickness: 320. Progression has been stable. Findings include outer retinal atrophy, central retinal atrophy, retinal drusen .   Notes NO ACTIVE CSME,,  OU,,,    epiretinal membrane OD is nasal to the fovea and not visually significant, diffusely with much less thickening OU             ASSESSMENT/PLAN:  Right epiretinal membrane Minor epiretinal membrane nasally, no active maculopathy  Controlled type 2 diabetes mellitus with hyperglycemia, with long-term current use of insulin (HCC) The nature of regressed proliferative diabetic retinopathy was discussed with the patient. The patient was advised to maintain good glucose, blood pressure, monitor kidney function and serum lipid control as advised by personal physician. Rare risk for reactivation of  progression exist with untreated severe anemia, untreated renal failure, untreated heart failure, and smoking. Complete avoidance of smoking was recommended. The chance of recurrent proliferative diabetic retinopathy was discussed as well as the chance of vitreous hemorrhage for which further treatments may be necessary.   Explained to the patient that the quiescent  proliferative diabetic retinopathy disease is unlikely to ever worsen.  Worsening factors would include however severe anemia, hypertension out-of-control or impending renal failure.  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 The patient was advised to maintain good  glucose, blood pressure, monitor kidney function and serum lipid control as advised by personal physician. Rare risk for reactivation of progression exist with untreated severe anemia, untreated renal failure, untreated heart failure, and smoking. Complete avoidance of smoking was recommended. The chance of recurrent proliferative diabetic retinopathy was discussed as well as the chance of vitreous hemorrhage for which further treatments may be necessary.   Explained to the patient that the quiescent  proliferative diabetic retinopathy disease is unlikely to ever worsen.  Worsening factors would include however severe anemia, hypertension out-of-control or impending renal failure.     ICD-10-CM   1. Type 2 diabetes mellitus with right eye affected by proliferative retinopathy without macular edema, with long-term current use of insulin (HCC)  E11.3591 OCT, Retina - OU - Both Eyes   Z79.4     2. Type 2 diabetes mellitus with left eye affected by proliferative retinopathy without  macular edema, with long-term current use of insulin (HCC)  E11.3592 OCT, Retina - OU - Both Eyes   Z79.4     3. Right epiretinal membrane  H35.371     4. Controlled type 2 diabetes mellitus with hyperglycemia, with long-term current use of insulin (HCC)  E11.65    Z79.4       1.  2.  3.  Ophthalmic Meds Ordered this visit:  No orders of the defined types were placed in this encounter.      Return in about 9 months (around 04/25/2022) for DILATE OU, COLOR FP, OCT.  There are no Patient Instructions on file for this visit.   Explained the diagnoses, plan, and follow up with the patient and they expressed understanding.  Patient expressed understanding of the importance of proper follow up care.   Clent Demark Zamiah Tollett M.D. Diseases & Surgery of the Retina and Vitreous Retina & Diabetic [place] 07/26/21     Abbreviations: M myopia (nearsighted); A astigmatism; H hyperopia (farsighted); P presbyopia; Mrx spectacle prescription;  CTL contact lenses; OD right eye; OS left eye; OU both eyes  XT exotropia; ET esotropia; PEK punctate epithelial keratitis; PEE punctate epithelial erosions; DES dry eye syndrome; MGD meibomian gland dysfunction; ATs artificial tears; PFAT's preservative free artificial tears; [place] nuclear sclerotic cataract; PSC posterior subcapsular cataract; ERM epi-retinal membrane; PVD posterior vitreous detachment; RD retinal detachment; DM diabetes mellitus; DR diabetic retinopathy; NPDR non-proliferative diabetic retinopathy; PDR proliferative diabetic retinopathy; CSME clinically significant macular edema; DME diabetic macular edema; dbh dot blot hemorrhages; CWS cotton wool spot; POAG primary open angle glaucoma; C/D cup-to-disc ratio; HVF humphrey visual field; GVF goldmann visual field; OCT optical coherence tomography; IOP intraocular pressure; BRVO Branch retinal vein occlusion; CRVO central retinal vein occlusion; CRAO central retinal artery occlusion; BRAO branch  retinal artery occlusion; RT retinal tear; SB scleral buckle; PPV pars plana vitrectomy; VH Vitreous hemorrhage; PRP panretinal laser photocoagulation; IVK intravitreal kenalog; VMT vitreomacular traction; MH Macular hole;  NVD neovascularization of the disc; NVE neovascularization elsewhere; AREDS age related eye disease study; ARMD age related macular degeneration; POAG primary open angle glaucoma; EBMD epithelial/anterior basement membrane dystrophy; ACIOL anterior chamber intraocular lens; IOL intraocular lens; PCIOL posterior chamber intraocular lens; Phaco/IOL phacoemulsification with intraocular lens placement; [place] photorefractive keratectomy; LASIK laser assisted in situ keratomileusis; HTN hypertension; DM diabetes mellitus; COPD chronic obstructive pulmonary disease

## 2021-07-26 NOTE — Assessment & Plan Note (Signed)

## 2021-07-26 NOTE — Assessment & Plan Note (Signed)
No active disease OS

## 2021-07-27 ENCOUNTER — Encounter: Payer: Medicare Other | Admitting: *Deleted

## 2021-07-27 ENCOUNTER — Ambulatory Visit (INDEPENDENT_AMBULATORY_CARE_PROVIDER_SITE_OTHER): Payer: Medicare Other

## 2021-07-27 VITALS — BP 133/73 | HR 58 | Temp 97.7°F | Resp 19

## 2021-07-27 DIAGNOSIS — J84112 Idiopathic pulmonary fibrosis: Secondary | ICD-10-CM

## 2021-07-27 DIAGNOSIS — J8410 Pulmonary fibrosis, unspecified: Secondary | ICD-10-CM

## 2021-07-27 DIAGNOSIS — Z006 Encounter for examination for normal comparison and control in clinical research program: Secondary | ICD-10-CM

## 2021-07-27 MED ORDER — STUDY - FIBROGEN - PAMREVLUMAB 10 MG/ML IV INFUSION (OPEN LABEL) (PI-RAMASWAMY)
30.0000 mg/kg | Freq: Once | INTRAVENOUS | Status: AC
  Administered 2021-07-27: 3010 mg via INTRAVENOUS
  Filled 2021-07-27: qty 301

## 2021-07-27 NOTE — Progress Notes (Signed)
Diagnosis: Idiopathic Pulmonary Fibrosis (IPF)  Provider:  Marshell Garfinkel, MD  Procedure: Infusion  IV Type: Peripheral, IV Location: R Antecubital  FIBROGEN (Pamrevlumab), Dose: 301  Infusion Start Time: 2119  Infusion Stop Time: 1045  Post Infusion IV Care: Observation period completed  Discharge: Condition: Good, Destination: Home . AVS provided to patient.   Performed by:  Charlie Pitter, RN

## 2021-07-27 NOTE — Research (Signed)
IPF-PRO Registry  Purpose: To collect data and biological samples that will support future research studies.  The goal of the registry is to research the current approaches to diagnosis, treatment, and progression of IPF. In addition, the registry will analyze participant characteristics, assess quality of life, describe participants interactions with the health care system, determine IPF treatment practices across multiple institutions, and utilize biological samples to identify disease biomarkers.    [facility] Coordinator note :   This visit for Subject Shane [name] with DOB: 03/31/1941 on 07/27/2021 for the below protocol is Visit/Encounter # [id number] and is for purpose of research.   The consent for this encounter is under Protocol Amendment 4 (Version Date: 16 August 2018) , Consent Version [facility] [employer] Approved Version 25 Sep 2018 Revised 25 Sep 2018.  Subject expressed continued interest and consent in continuing as a study subject. Subject confirmed that there was no change in contact information (e.g. address, telephone, email). Subject thanked for participation in research and contribution to science.    The Subject was informed that the PI Dr. Chase Caller continues to have oversight of the subject's visits and course  through relevant discussions, reviews and also specifically of this visit by routing of this note to the [facility].  During this visit on 07/27/2021, the subject completed the blood work and questionnaires per the above referenced protocol. Please refer to the subject's paper source binder for further details.   Signed by  [facility] Coordinator  [facility]  [place], [place] 1:51 PM 07/27/2021

## 2021-08-04 ENCOUNTER — Encounter: Payer: Medicare Other | Admitting: Physical Therapy

## 2021-08-05 NOTE — Progress Notes (Signed)
Diagnosis: Idiopathic Pulmonary Fibrosis (IPF)  Provider:  Marshell Garfinkel, MD  Procedure: Infusion  IV Type: Peripheral, IV Location: R Antecubital  FIBROGEN (Pamrevlumab), Dose: 301  Infusion Start Time: 9672  Infusion Stop Time: 1045  Post Infusion IV Care: Observation period completed  Discharge: Condition: Good, Destination: Home . AVS provided to patient.   Performed by:  Cleophus Molt RN

## 2021-08-07 ENCOUNTER — Other Ambulatory Visit: Payer: Self-pay | Admitting: Physician Assistant

## 2021-08-07 ENCOUNTER — Other Ambulatory Visit: Payer: Self-pay | Admitting: Family

## 2021-08-19 ENCOUNTER — Ambulatory Visit: Payer: Medicare Other

## 2021-08-20 ENCOUNTER — Encounter: Payer: Medicare Other | Admitting: Physical Medicine and Rehabilitation

## 2021-08-24 ENCOUNTER — Encounter: Payer: Medicare Other | Admitting: Physical Therapy

## 2021-09-08 ENCOUNTER — Encounter: Payer: Medicare Other | Admitting: Physical Therapy

## 2021-10-11 ENCOUNTER — Encounter: Payer: Medicare Other | Admitting: Physical Therapy

## 2021-11-02 NOTE — Progress Notes (Deleted)
HPI: FU dyspnea/CAD. Patient has interstitial lung disease. Patient had lung biopsy on January 22. Pathology consistent with usual interstitial pneumonia. Cardiac catheterization August 2021 showed severe three-vessel coronary artery disease with 50 to 70% left main, 70% mid LAD, 70% apical LAD and 90% distal, 80% circumflex, proximal 90% RCA and 75% distal.  Patient was seen by Dr. Cyndia Bent and coronary artery bypass and graft was offered but patient wanted to consider.  Ultimately he had coronary artery bypass and graft at [facility] December 2021 (LIMA to the LAD, saphenous vein graft to the first obtuse marginal and saphenous vein graft to the PDA).  Carotid Dopplers February 2022 showed 1 to 39% bilateral stenosis. Echocardiogram April 2022 showed ejection fraction 50 to 55%, moderate left ventricular hypertrophy, mild right ventricular enlargement, moderate RV dysfunction, mild to moderate left atrial enlargement, moderate right atrial enlargement, mild mitral regurgitation, moderate aortic stenosis with mean gradient 21 mmHg, mildly dilated ascending aorta at 41 mm.  Patient also with history of paroxysmal atrial fibrillation/flutter.  Since last seen,  Current Outpatient Medications  Medication Sig Dispense Refill   acetaminophen (TYLENOL) 325 MG tablet Take 1-2 tablets (325-650 mg total) by mouth every 6 (six) hours as needed for mild pain (pain score 1-3 or temp > 100.5).     albuterol (VENTOLIN HFA) 108 (90 Base) MCG/ACT inhaler Inhale 2 puffs into the lungs every 4 (four) hours as needed for wheezing or shortness of breath. 6.7 g 0   apixaban (ELIQUIS) 5 MG TABS tablet Take 1 tablet (5 mg total) by mouth 2 (two) times daily. 60 tablet 11   Ascorbic Acid (VITAMIN C WITH ROSE HIPS) 500 MG tablet Take 1 tablet (500 mg total) by mouth daily. 30 tablet 0   atorvastatin (LIPITOR) 80 MG tablet Take 1 tablet (80 mg total) by mouth daily. 90 tablet 3   docusate sodium (COLACE) 100 MG capsule  Take 1 capsule (100 mg total) by mouth daily. 10 capsule 0   doxycycline (VIBRA-TABS) 100 MG tablet Take 1 tablet (100 mg total) by mouth 2 (two) times daily. 60 tablet 0   escitalopram (LEXAPRO) 10 MG tablet Take 1 tablet (10 mg total) by mouth daily. 30 tablet 0   furosemide (LASIX) 40 MG tablet Take 1 tablet (40 mg total) by mouth daily. 30 tablet 0   gabapentin (NEURONTIN) 300 MG capsule TAKE 1 CAPSULE BY MOUTH THREE TIMES A DAY 90 capsule 0   insulin aspart (NOVOLOG FLEXPEN) 100 UNIT/ML FlexPen Inject 6 Units into the skin 3 (three) times daily with meals. 15 mL 11   insulin glargine (LANTUS) 100 UNIT/ML Solostar Pen Inject 8 Units into the skin at bedtime. 15 mL 11   iron polysaccharides (NIFEREX) 150 MG capsule Take 1 capsule (150 mg total) by mouth daily with lunch. 30 capsule 0   LORazepam (ATIVAN) 1 MG tablet Take 1 tablet (1 mg total) by mouth at bedtime. 30 tablet 0   magnesium oxide (MAG-OX) 400 (240 Mg) MG tablet Take 1 tablet (400 mg total) by mouth daily. 90 tablet 1   metoprolol tartrate (LOPRESSOR) 50 MG tablet Take 0.5 tablets (25 mg total) by mouth daily. 30 tablet 0   Multiple Vitamin (MULTIVITAMIN WITH MINERALS) TABS tablet Take 1 tablet by mouth daily.     Olopatadine HCl (PATADAY OP) Place 1 drop into both eyes daily as needed (allergies).     pantoprazole (PROTONIX) 40 MG tablet Take 1 tablet (40 mg total) by mouth  2 (two) times daily. 60 tablet 0   polyethylene glycol powder (GLYCOLAX/MIRALAX) 17 GM/SCOOP powder Take 17 g by mouth daily as needed for moderate constipation.     Probiotic Product (PROBIOTIC PO) Take 1 capsule by mouth daily.     traMADol (ULTRAM) 50 MG tablet Take 1 tablet (50 mg total) by mouth every 8 (eight) hours as needed for severe pain or moderate pain. 30 tablet 0   Zinc Oxide (TRIPLE PASTE) 12.8 % ointment Apply 1 application topically 2 (two) times daily. Apply to affected area 56.7 g 0   zinc sulfate 220 (50 Zn) MG capsule Take 1 capsule (220 mg  total) by mouth daily. 30 capsule 0   No current facility-administered medications for this visit.     Past Medical History:  Diagnosis Date   Anxiety    AV block, [place] 1    Cataract    Chronic kidney disease    d/t DM   CKD (chronic kidney disease), stage III (HCC)    Depression    Diabetes mellitus    Vgo disposal insulin bolus  simular to insulin pump   Dyspnea    GERD (gastroesophageal reflux disease)    History of kidney stones    passed   Hyperlipidemia    Hypertension    Idiopathic pulmonary fibrosis ([place]) 11/2016   ILD (interstitial lung disease) ([place])    Moderate aortic stenosis    a. 10/2019 Echo: EF 55-60%, Gr2 DD. Nl RV.    Neuromuscular disorder ([place])    Neuropathy associated with endocrine disorder ([place])    Nonobstructive CAD (coronary artery disease)    a. 2012 Cath: mod, nonobs dzs; b. 10/2016 MV: EF 60%, no ischemia.   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;  Surgeon: [name], MD;  Location: [place];  Service: Orthopedics;  Laterality: Right;   AMPUTATION Right 03/17/2021   Procedure: RIGHT BELOW KNEE AMPUTATION;  Surgeon: [name], MD;  Location: [place];  Service: Orthopedics;  Laterality: Right;   ANKLE FUSION Right 01/22/2021   Procedure: RIGHT FOOT TIBIOCALCANEAL FUSION;  Surgeon: [name], MD;  Location: [place];  Service: Orthopedics;  Laterality: Right;   ANTERIOR FUSION CERVICAL SPINE  2012   CARDIAC CATHETERIZATION  2011    CARDIAC CATHETERIZATION N/A 11/09/2016   Procedure: Right Heart Cath;  Surgeon: Belva Crome, MD;  Location: [place] [facility];  Service: Cardiovascular;  Laterality: N/A;   carpel tunnel     left wrist   CATARACT EXTRACTION     CATARACT EXTRACTION W/ INTRAOCULAR LENS  IMPLANT, BILATERAL  2013   CERVICAL LAMINECTOMY  2012   COLONOSCOPY N/A 01/14/2013   Procedure: COLONOSCOPY;  Surgeon: Irene Shipper, MD;  Location: [facility];  Service: Endoscopy;  Laterality: N/A;   CORONARY ARTERY BYPASS GRAFT  11/04/2020   LIMA-LAD, SVG-OM1, SVG-PDA (Dr Marney [facility]) dc 11/18/2020   EYE SURGERY     I & D  EXTREMITY Right 02/19/2021   Procedure: RIGHT ANKLE DEBRIDEMENT AND PLACEMENT ANTIBIOTIC BEADS;  Surgeon: [name], MD;  Location: [place];  Service: Orthopedics;  Laterality: Right;   KNEE SURGERY  1998   left   LEFT HEART CATH AND CORONARY ANGIOGRAPHY N/A 07/10/2020   Procedure: LEFT HEART CATH AND CORONARY ANGIOGRAPHY;  Surgeon: Sherren Mocha, MD;  Location: [place] [facility];  Service: Cardiovascular;  Laterality: N/A;   LUMBAR LAMINECTOMY  2003   LUNG BIOPSY Left 12/26/2016   Procedure: LUNG BIOPSY;  Surgeon: [name], MD;  Location: [place];  Service: Thoracic;  Laterality: Left;   PACEMAKER IMPLANT N/A 03/30/2020   Procedure: PACEMAKER IMPLANT;  Surgeon: Evans Lance, MD;  Location: [place] [facility];  Service: Cardiovascular;  Laterality: N/A;   PERIPHERAL VASCULAR INTERVENTION Right 12/10/2020   Procedure: PERIPHERAL VASCULAR INTERVENTION;  Surgeon: Cherre Robins, MD;  Location: [place] [facility];  Service: Cardiovascular;  Laterality: Right;  SFA   POSTERIOR FUSION CERVICAL SPINE  2012   PPM GENERATOR REMOVAL N/A 12/14/2020   Procedure: PPM GENERATOR REMOVAL;  Surgeon: Evans Lance, MD;  Location: [place] [facility];  Service: Cardiovascular;  Laterality: N/A;   TEE WITHOUT CARDIOVERSION N/A 12/11/2020   Procedure: TRANSESOPHAGEAL ECHOCARDIOGRAM (TEE);  Surgeon: Geralynn Rile, MD;  Location: [place];  Service: Cardiovascular;  Laterality: N/A;   [place]  2011   4th finger left hand   VIDEO ASSISTED THORACOSCOPY Left 12/26/2016   Procedure: VIDEO ASSISTED THORACOSCOPY;  Surgeon: [name], MD;  Location: [place];  Service: Thoracic;  Laterality: Left;   VIDEO BRONCHOSCOPY N/A 12/26/2016   Procedure: VIDEO BRONCHOSCOPY;  Surgeon: [name], MD;  Location: [place];  Service: Thoracic;  Laterality: N/A;    Social History   Socioeconomic History   Marital status: Married    Spouse name: Not on file   Number of children: Not on file   Years of education: Not on file   Highest education level: Not on file  Occupational History   Not on file  Tobacco Use   Smoking status: Never   Smokeless tobacco: Never  Vaping Use   Vaping Use: Never used  Substance and Sexual Activity   Alcohol use: No   Drug use: No   Sexual activity: Not Currently  Other Topics Concern   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    Social Determinants of Radio broadcast assistant Strain: Not on file  Food Insecurity: Not on file  Transportation Needs: Not on file  Physical Activity: Not on file  Stress: Not on file  Social Connections: Not on file  Intimate Partner Violence: Not on file    Family History  Problem Relation Age of Onset   Diabetes Mellitus II Mother    Emphysema Father 39   Heart attack Father    Colon cancer Neg Hx    Esophageal cancer Neg Hx    Rectal cancer Neg Hx    Stomach cancer Neg Hx     ROS: no fevers or chills, productive cough, hemoptysis, dysphasia, odynophagia, melena, hematochezia, dysuria, hematuria, rash, seizure activity, orthopnea, PND, pedal edema, claudication. Remaining systems are negative.  Physical Exam: Well-developed well-nourished in no acute distress.  Skin is warm and dry.  HEENT is normal.  Neck is supple.  Chest is clear to auscultation  with normal expansion.  Cardiovascular exam is regular rate  and rhythm.  Abdominal exam nontender or distended. No masses palpated. Extremities show no edema. neuro grossly intact  ECG- personally reviewed  A/P  1 coronary artery disease-patient doing well with no chest pain.  Continue aspirin and statin.  2 aortic stenosis-plan follow-up echocardiogram April 2023.  He understands he will likely require TAVR in the future.  3 previous pacemaker-this was removed previously due to infection in his foot and was felt likely to involve his pacemaker.  4 hypertension-blood pressure controlled.  Continue present medical regimen.  5 hyperlipidemia-continue statin.  6 history of syncope-patient has had no recurrences since previous office visit.  7 paroxysmal atrial fibrillation-we will continue apixaban at present dose.  Shane Ruths, MD

## 2021-11-08 ENCOUNTER — Encounter: Payer: Medicare Other | Admitting: Physical Therapy

## 2021-11-08 NOTE — Research (Signed)
Late Progress Note Entry for visit completed on 23Aug2022:  Title: [id number] Open-Label Extension (OLE) is a multi-center, single-arm, open-label extension (OLE) phase where subjects who complete the Week 48 visit of the main study are eligible to participate. The extension to the main study allows for the continued evaluation of the efficacy and safety of 30 mg/kg IV infusions of pamrevlumab administered every 3 weeks for 52 weeks in subjects with Idiopathic Pulmonary Fibrosis.  Primary endpoint is: To provide continued access to pamrevlumab in subjects with IPF who completed the Week 48 visit of the main study  Protocol #: [id number], Clinical Trials #: [id number] Sponsor: [email or web address]  ([place], [place], [place])  [facility] @ [employer] Coordinator note :   This visit for Subject Shane [name] with DOB: Feb 04, 1941 on 11/08/2021 for the above protocol is Visit/Encounter # Week 90  and is for purpose of research.   The consent for this encounter is under Protocol: Protocol Amendment 5.0 (Dated 26AUG2021)  IB: version 19 dated [id number] Main ICF: version 7.0 revised 21JUL2021 Optional Genetic Consent: version 10Apr2020, Revised [id number] OLE ICF: version 4.0, revised 21JUL2021 and is currently IRB approved.   Subject expressed continued interest and consent in continuing as a study subject. Subject confirmed that there was  no change in contact information (e.g. address, telephone, email). Subject thanked for participation in research and contribution to science.   The Subject was informed that the PI Dr. Chase Caller continues to have oversight of the subject's visits and course  through relevant discussions, reviews and also specifically of this visit by routing of this note to the [facility].  Infusion started at 11:23 with chamber #[id number]. After a couple of minutes of the infusion running the chamber alarmed stating there was an occlusion in the line which stopped the  infusion. After troubleshooting it was determined that the chamber was possibly not working properly so a new chamber was used and the infusion was restarted. The time it took to troubleshoot was a total of 13 minutes.  Subject tolerated infusion well without any complaints. Refer to the subjects paper source binder for further details of the visit.    Signed by  Tifton Bing, CMA, BS, [place] Coordinator [place], [place] 3:38 PM 11/08/2021

## 2021-11-09 ENCOUNTER — Ambulatory Visit: Payer: Medicare Other | Admitting: Cardiology

## 2021-11-12 ENCOUNTER — Encounter: Payer: Medicare Other | Admitting: *Deleted

## 2021-11-12 ENCOUNTER — Other Ambulatory Visit: Payer: Self-pay

## 2021-11-12 ENCOUNTER — Ambulatory Visit (INDEPENDENT_AMBULATORY_CARE_PROVIDER_SITE_OTHER): Payer: Medicare Other

## 2021-11-12 VITALS — BP 119/65 | HR 61 | Temp 97.9°F | Resp 20

## 2021-11-12 VITALS — BP 114/67 | HR 60 | Temp 97.8°F | Resp 20

## 2021-11-12 DIAGNOSIS — Z006 Encounter for examination for normal comparison and control in clinical research program: Secondary | ICD-10-CM

## 2021-11-12 DIAGNOSIS — J84112 Idiopathic pulmonary fibrosis: Secondary | ICD-10-CM

## 2021-11-12 MED ORDER — STUDY - FIBROGEN - PAMREVLUMAB 10 MG/ML IV INFUSION (OPEN LABEL) (PI-RAMASWAMY)
30.0000 mg/kg | Freq: Once | INTRAVENOUS | Status: AC
  Administered 2021-11-12: 2940 mg via INTRAVENOUS
  Filled 2021-11-12: qty 294

## 2021-11-12 NOTE — Progress Notes (Signed)
Diagnosis: Idiopathic Pulmonary Fibrosis (IPF)  Provider:  Marshell Garfinkel, MD  Procedure: Infusion  IV Type: Peripheral, IV Location: R Antecubital  FIBROGEN (Pamrevlumab), Dose: 2940mg   Infusion Start Time: 1037  Infusion Stop Time: 1115  Post Infusion IV Care: Observation period completed and Peripheral IV Discontinued  Discharge: Condition: Good, Destination: Home . AVS provided to patient.   Performed by:  Koren Shiver, RN

## 2021-11-12 NOTE — Research (Signed)
Title: [id number] Open-Label Extension (OLE) is a multi-center, single-arm, open-label extension (OLE) phase where subjects who complete the Week 48 visit of the main study are eligible to participate. The extension to the main study allows for the continued evaluation of the efficacy and safety of 30 mg/kg IV infusions of pamrevlumab administered every 3 weeks for 52 weeks in subjects with Idiopathic Pulmonary Fibrosis.  Primary endpoint is: To provide continued access to pamrevlumab in subjects with IPF who completed the Week 48 visit of the main study  Protocol #: [id number], Clinical Trials #: [id number] Sponsor: [email or web address]  ([place], [place], [place])  [facility] @ [employer] Coordinator note :   This visit for Subject Shane [name] with DOB: 1941/09/13 on 11/12/2021 for the above protocol is Visit/Encounter # EX Week 105 and is for purpose of research.   The consent for this encounter is under Protocol: Protocol Amendment 5.0 (Dated 26AUG2021) IB: version 19 dated 05NOV2021 Main ICF: version 7.0 revised 21JUL2021 Optional Genetic Consent: version 10Apr2020, Revised 28May2021 OLE ICF: version 4.0, revised 21JUL2021  Subject expressed continued interest and consent in continuing as a study subject. Subject confirmed that there was no change in contact information (e.g. address, telephone, email). Subject thanked for participation in research and contribution to science.    The Subject was informed that the PI Dr. Chase Caller continues to have oversight of the subject's visits and course  through relevant discussions, reviews and also specifically of this visit by routing of this note to the [facility].  The subject has resumed study treatment after missing 4 consecutive visits since 23Aug2022. Approval to resume study treatment at the patient's discretion was obtained and granted by the medical monitor on 12Oct2022. The patient appeared well today and reports no new  complaints. The patient received and tolerated the infusion well. Please refer to study binder for further details.    Signed by  Lytle Michaels Clinical Research Coordinator [facility]  [place], [place] 12:03 PM 11/12/2021

## 2021-12-03 ENCOUNTER — Ambulatory Visit (INDEPENDENT_AMBULATORY_CARE_PROVIDER_SITE_OTHER): Payer: Medicare Other

## 2021-12-03 ENCOUNTER — Encounter: Payer: Medicare Other | Admitting: *Deleted

## 2021-12-03 ENCOUNTER — Other Ambulatory Visit: Payer: Self-pay

## 2021-12-03 VITALS — BP 98/56 | HR 60 | Temp 97.9°F | Resp 20

## 2021-12-03 VITALS — BP 107/67 | HR 61 | Temp 98.6°F | Resp 20

## 2021-12-03 DIAGNOSIS — J84112 Idiopathic pulmonary fibrosis: Secondary | ICD-10-CM

## 2021-12-03 DIAGNOSIS — Z006 Encounter for examination for normal comparison and control in clinical research program: Secondary | ICD-10-CM

## 2021-12-03 MED ORDER — STUDY - FIBROGEN - PAMREVLUMAB 10 MG/ML IV INFUSION (OPEN LABEL) (PI-RAMASWAMY)
30.0000 mg/kg | Freq: Once | INTRAVENOUS | Status: AC
  Administered 2021-12-03: 11:00:00 2940 mg via INTRAVENOUS
  Filled 2021-12-03: qty 294

## 2021-12-03 NOTE — Research (Signed)
Title: [id number] Open-Label Extension (OLE) is a multi-center, single-arm, open-label extension (OLE) phase where subjects who complete the Week 48 visit of the main study are eligible to participate. The extension to the main study allows for the continued evaluation of the efficacy and safety of 30 mg/kg IV infusions of pamrevlumab administered every 3 weeks for 52 weeks in subjects with Idiopathic Pulmonary Fibrosis.   Primary endpoint is: To provide continued access to pamrevlumab in subjects with IPF who completed the Week 48 visit of the main study.  Protocol #: [id number], Clinical Trials #: [id number] Sponsor: [email or web address]  ([place], [place], [place])  [facility] @ [employer] Coordinator note :   This visit for Subject Shane [name] with DOB: Dec 06, 1940 on 12/03/2021 for the above protocol is Visit/Encounter # EX Week 108 and is for purpose of research.   The consent for this encounter is under Protocol: Protocol Amendment 5.0 (Approved date: 26AUG2021) IB: version 20 dated [id number] (Approved [id number])  Main ICF: version 7.0 revised 21JUL2021 Optional Genetic Consent: version 10Apr2020, Revised 28May2021 OLE ICF: version 4.0, revised 21JUL2021  Subject expressed continued interest and consent in continuing as a study subject. Subject confirmed that there was no change in contact information (e.g. address, telephone, email). Subject thanked for participation in research and contribution to science.  The Subject was informed that the PI Dr. Chase Caller continues to have oversight of the subject's visits and course  through relevant discussions, reviews and also specifically of this visit by routing of this note to the [facility].  The patient appeared well today. He reports no new complaints since his last visit. He received and tolerated the infusion well. Please refer to subject binder for further details.    Signed by  Lytle Michaels Clinical Research  Coordinator [facility]  [place], [place] 12:32 PM 12/03/2021

## 2021-12-03 NOTE — Progress Notes (Signed)
Diagnosis: Idiopathic Pulmonary Fibrosis (IPF)  Provider:  Marshell Garfinkel, MD  Procedure: Infusion  IV Type: Peripheral, IV Location: L Antecubital  FIBROGEN (Pamrevlumab), Dose: 2,940mg   Infusion Start Time: 1046  Infusion Stop Time: 1126  Post Infusion IV Care: Observation period completed and Peripheral IV Discontinued  Discharge: Condition: Good, Destination: Home .  Performed by:  Amairani Shuey, Sherlon Handing, LPN

## 2021-12-09 ENCOUNTER — Encounter: Payer: Medicare Other | Admitting: Physical Therapy

## 2021-12-11 ENCOUNTER — Other Ambulatory Visit: Payer: Self-pay | Admitting: Physician Assistant

## 2021-12-24 ENCOUNTER — Other Ambulatory Visit: Payer: Self-pay

## 2021-12-24 ENCOUNTER — Encounter: Payer: Self-pay | Admitting: Adult Health

## 2021-12-24 ENCOUNTER — Ambulatory Visit (INDEPENDENT_AMBULATORY_CARE_PROVIDER_SITE_OTHER): Payer: Medicare Other

## 2021-12-24 ENCOUNTER — Encounter (INDEPENDENT_AMBULATORY_CARE_PROVIDER_SITE_OTHER): Payer: Medicare Other | Admitting: Adult Health

## 2021-12-24 ENCOUNTER — Encounter: Payer: Medicare Other | Admitting: *Deleted

## 2021-12-24 VITALS — BP 120/69 | HR 64 | Temp 98.1°F | Resp 16

## 2021-12-24 DIAGNOSIS — Z006 Encounter for examination for normal comparison and control in clinical research program: Secondary | ICD-10-CM

## 2021-12-24 DIAGNOSIS — J8410 Pulmonary fibrosis, unspecified: Secondary | ICD-10-CM

## 2021-12-24 DIAGNOSIS — J84112 Idiopathic pulmonary fibrosis: Secondary | ICD-10-CM

## 2021-12-24 MED ORDER — STUDY - FIBROGEN - PAMREVLUMAB 10 MG/ML IV INFUSION (OPEN LABEL) (PI-RAMASWAMY)
30.0000 mg/kg | Freq: Once | INTRAVENOUS | Status: AC
  Administered 2021-12-24: 2940 mg via INTRAVENOUS
  Filled 2021-12-24: qty 294

## 2021-12-24 NOTE — Progress Notes (Signed)
Diagnosis: Idiopathic Pulmonary Fibrosis (IPF)  Provider:  Marshell Garfinkel, MD  Procedure: Infusion  IV Type: Peripheral, IV Location: L Antecubital  FIBROGEN (Pamrevlumab), Dose: 2940mg   Infusion Start Time: 1020  Infusion Stop Time: 1059  Post Infusion IV Care: Observation period completed and Peripheral IV Discontinued  Discharge: Condition: Good, Destination: Home .  Performed by:  Koren Shiver, RN

## 2021-12-24 NOTE — Assessment & Plan Note (Signed)
Continue study protocol 

## 2021-12-24 NOTE — Research (Signed)
Title: [id number] Open-Label Extension (OLE) is a multi-center, single-arm, open-label extension (OLE) phase where subjects who complete the Week 48 visit of the main study are eligible to participate. The extension to the main study allows for the continued evaluation of the efficacy and safety of 30 mg/kg IV infusions of pamrevlumab administered every 3 weeks for 52 weeks in subjects with Idiopathic Pulmonary Fibrosis.   Primary endpoint is: To provide continued access to pamrevlumab in subjects with IPF who completed the Week 48 visit of the main study.  Protocol #: [id number], Clinical Trials #: [id number] Sponsor: [email or web address]  ([place], [place], [place])  [facility] @ [employer] Coordinator note :   This visit for Subject Shane [name] with DOB: Feb 21, 1941 on 12/24/2021 for the above protocol is Visit/Encounter # Ex Week 111 and is for purpose of research.   The consent for this encounter is under Protocol: Protocol Amendment 6.0 (Approved date: 31OCT2022) IB: version 20 dated [id number] (Approved [id number])  Main ICF: version [id number], revised 16Dec22 Optional Genetic Consent: version [id number], revised 16Dec22 OLE ICF: version [id number], revised 16Dec22  Subject expressed continued interest and consent in continuing as a study subject. Subject confirmed that there was no change in contact information (e.g. address, telephone, email). Subject thanked for participation in research and contribution to science.    The Subject was was informed that the PI Dr. Chase Caller continues to have oversight of the subject's visits and course through relevant discussions, reviews and also specifically of this visit by routing of this note to the [facility].   Re-consent is required at this visit due to Protocol Amendment V6.0. Subject was re-consented to amended ICF for subjects participating in the open label extension phase. Sub-I, Tammy Parrett participated in the re-consent  process. Refer to the subjects paper source binder for further details of the re-consent.   All procedures completed per the above mentioned protocol. Subject received and tolerated the infusion well. Please refer to the paper study binder for further details.     Signed by  Lytle Michaels Clinical Research Coordinator [facility]  [place], [place] 11:47 AM 12/24/2021

## 2021-12-24 NOTE — Progress Notes (Signed)
@Patient  ID: Shane [name], male    DOB: 01-09-1941, 81 y.o.   MRN: [id number]  Chief Complaint  Patient presents with   Research    Referring provider: Burnard Bunting, MD  HPI:  Title: [id number] Open-Label Extension (OLE) is a multi-center, single-arm, open-label extension (OLE) phase where subjects who complete the Week 48 visit of the main study are eligible to participate. The extension to the main study allows for the continued evaluation of the efficacy and safety of 30 mg/kg IV infusions of pamrevlumab administered every 3 weeks for 52 weeks in subjects with Idiopathic Pulmonary Fibrosis.   Primary endpoint is: To provide continued access to pamrevlumab in subjects with IPF who completed the Week 48 visit of the main study.  Protocol #: [id number], Clinical Trials #: [id number] Sponsor: [email or web address]  ([place], [place], [place])  Nature conservation officer Features of Pamrevlumab ([id number]) the study drug: a recombinant fully human IgG kappa monoclonal antibody that binds to CTGF and is being developed for treatment of diseases in which tissue fibrosis has a major pathogenic role. In particular, pamrevlumab appears to disrupt a CTGF autocrine loop in mesenchymal cells like myofibroblasts that reduces their recruitment of leukocytes like macrophages, mast and dendritic cells via chemokine secretion. This disruption results in collapse of the cellular crosstalk that drives tissue remodeling.  Key Inclusion Criteria Completed the Week 48 visit of the main study.   The investigator considers the subject to be suitable for continued participation in the OLE phase, including ability to comply with study visits every 3 weeks for pamrevlumab infusions.  A signed and dated IRB/IEC-approved informed consent must be obtained before dosing start, and preferably at the Week 48 visit of the main study.  Male subjects with partners of childbearing potential and male subjects of childbearing potential  (including those <1 year postmenopausal) must continue to use highly effective contraception methods until 3 months after the last dose of study drug in the OLE phase.   Key Exclusion Criteria Male subjects who are pregnant or nursing.  Use of any investigational drugs, or participation in a clinical trial with an investigational new drug, other than participation in the main study.  History of allergic or anaphylactic reaction to human, humanized, chimeric or murine monoclonal antibodies, or experienced an allergic or anaphylactic reaction to study drug or to any component of the excipient while participating in the main study.  Subjects who withdrew informed consent while participating in the main study.  The investigator judges that the subject is not suitable for participation, or is unable to fully participate in the OLE phase and complete it for any reason, including inability to comply with study procedures and treatment, or any other relevant medical or psychiatric conditions.   Mechanism of Action Pamrevlumab is a human recombinant IgG monoclonal antibody that binds to connective tissue growth factor (CTGF).  CTGF plays a role by mediating the process of fibrosis. By binding to CTGF, pamrevlumab blocks its biologic activity; thereby preventing cell proliferation, adhesion and migration of growth factors involved in fibrotic changes. It is also being studied in other conditions where fibrotic changes play a role; liver fibrosis, Duchenne muscular dystrophy and certain cancers.   Half-life 58-141 hours; t1/2 increases with increasing doses   Interactions No known drug interactions. All concomitant medications to be reviewed by PI.  Safety Data 1102 subjects have been exposed to pamrevlumab, 520 with IPF The most common treatment emergent adverse events (TEAEs) in all subjects with IPF include: cough, fatigue, dyspnea, upper respiratory  tract infection, bronchitis, nasopharyngitis No  known effect on qtc prolongation, renal or hepatic issues Cleared [facility] Meeting [phone] for protocol to continue without changes  Updates: 08-Oct-2021: As of this date 2 cases of Serious Adverse Event (SAE) anaphylactic reaction reported. The sponsor does not believe that changes to the conduct of this study are warranted at this time.  26Sep2022: Embryo fetal development study findings in male rabbits treated with pamrevlumab determined potential risks for pregnant male patients that could cause fetal abnormalities. Per sponsor, there are no recommended changes to safety monitoring, study procedures, or study conduct as a result of this finding. Per PI, no safety or data integrity has been compromised.    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 The sponsor does not believe that changes to the conduct of this study are warranted at this time.   This visit for Subject Shane [name] with DOB: [birth year] on 12/24/2021 for the above protocol is Visit/Encounter # OLE consent  and is for purpose of research.  Subject/LAR expressed continued interest and consent in continuing as a study subject. Subject thanked for participation in research and contribution to science. Consent was signed by subject. In attendance with myself Sub-[employer] and Lytle Michaels , Research officer, political party.   Subject appears well with no complaints.   Allergies  Allergen Reactions   Codeine Hives and Itching   Ofev [Nintedanib] Diarrhea    SEVERE DIARRHEA   Pirfenidone Diarrhea and Other (See Comments)    Esbriet (Pirfenidone) causes elevated LFTs. 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 10/2019 Echo: EF 55-60%, Gr2 DD. Nl RV.    Neuromuscular disorder ([place])    Neuropathy associated with endocrine disorder ([place])    Nonobstructive CAD (coronary artery disease)    a. 2012 Cath: mod, nonobs dzs; b. 10/2016 MV: EF 60%, no ischemia.   OSA on CPAP 05/05/2017   Unattended Home Sleep Test 7/2/813-AHI 38.6/hour, desaturation to 64%, body weight 261 pounds   PONV (postoperative nausea and vomiting)    Sleep apnea     uses cpap asked to bring mask and tubing    Tobacco History: Social History   Tobacco Use  Smoking Status Never  Smokeless Tobacco Never   Counseling given: Not Answered   Outpatient Medications Prior to Visit  Medication Sig Dispense Refill   acetaminophen (TYLENOL) 325 MG tablet Take 1-2 tablets (325-650 mg total) by mouth every 6 (six) hours as needed for mild pain (pain score 1-3 or temp >  100.5).     albuterol (VENTOLIN HFA) 108 (90 Base) MCG/ACT inhaler Inhale 2 puffs into the lungs every 4 (four) hours as needed for wheezing or shortness of breath. 6.7 g 0   apixaban (ELIQUIS) 5 MG TABS tablet Take 1 tablet (5 mg total) by mouth 2 (two) times daily. 60 tablet 11   Ascorbic Acid (VITAMIN C WITH ROSE HIPS) 500 MG tablet Take 1 tablet (500 mg total) by mouth daily. 30 tablet 0   atorvastatin (LIPITOR) 80 MG tablet Take 1 tablet (80  mg total) by mouth daily. 90 tablet 3   docusate sodium (COLACE) 100 MG capsule Take 1 capsule (100 mg total) by mouth daily. 10 capsule 0   doxycycline (VIBRA-TABS) 100 MG tablet Take 1 tablet (100 mg total) by mouth 2 (two) times daily. 60 tablet 0   escitalopram (LEXAPRO) 10 MG tablet Take 1 tablet (10 mg total) by mouth daily. 30 tablet 0   furosemide (LASIX) 40 MG tablet Take 1 tablet (40 mg total) by mouth daily. 30 tablet 0   gabapentin (NEURONTIN) 300 MG capsule TAKE 1 CAPSULE BY MOUTH THREE TIMES A DAY 180 capsule 3   insulin aspart (NOVOLOG FLEXPEN) 100 UNIT/ML FlexPen Inject 6 Units into the skin 3 (three) times daily with meals. 15 mL 11   insulin glargine (LANTUS) 100 UNIT/ML Solostar Pen Inject 8 Units into the skin at bedtime. 15 mL 11   iron polysaccharides (NIFEREX) 150 MG capsule Take 1 capsule (150 mg total) by mouth daily with lunch. 30 capsule 0   LORazepam (ATIVAN) 1 MG tablet Take 1 tablet (1 mg total) by mouth at bedtime. 30 tablet 0   magnesium oxide (MAG-OX) 400 (240 Mg) MG tablet Take 1 tablet (400 mg total) by mouth daily. 90 tablet 1   metoprolol tartrate (LOPRESSOR) 50 MG tablet Take 0.5 tablets (25 mg total) by mouth daily. 30 tablet 0   Multiple Vitamin (MULTIVITAMIN WITH MINERALS) TABS tablet Take 1 tablet by mouth daily.     Olopatadine HCl (PATADAY OP) Place 1 drop into both eyes daily as needed (allergies).     pantoprazole (PROTONIX) 40 MG tablet Take 1 tablet (40 mg total) by mouth 2 (two) times daily. 60 tablet 0   polyethylene glycol powder (GLYCOLAX/MIRALAX) 17 GM/SCOOP powder Take 17 g by mouth daily as needed for moderate constipation.     Probiotic Product (PROBIOTIC PO) Take 1 capsule by mouth daily.     traMADol (ULTRAM) 50 MG tablet Take 1 tablet (50 mg total) by mouth every 8 (eight) hours as needed for severe pain or moderate pain. 30 tablet 0   Zinc Oxide (TRIPLE PASTE) 12.8 % ointment Apply 1 application topically 2 (two) times daily. Apply to  affected area 56.7 g 0   zinc sulfate 220 (50 Zn) MG capsule Take 1 capsule (220 mg total) by mouth daily. 30 capsule 0   STUDY - FIBROGEN - pamrevlumab 10 mg/mL IV infusion (open label) (PI-RAMASWAMY)      No facility-administered medications prior to visit.         STUDY - FIBROGEN - pamrevlumab 10 mg/mL IV infusion (open label) (PI-RAMASWAMY)     Date Action Dose Route User   11/12/2021 1037 New Bag/Given 2,940 mg Intravenous Koren Shiver, RN      STUDY - FIBROGEN - pamrevlumab 10 mg/mL IV infusion (open label) [place] Mask)     Date Action Dose Route User   12/03/2021 1046 New Bag/Given 2,940 mg Intravenous Durward Parcel  E, LPN      STUDY - FIBROGEN - pamrevlumab 10 mg/mL IV infusion (open label) (PI-RAMASWAMY)     Date Action Dose Route User   12/24/2021 1020 New Bag/Given 2,940 mg Intravenous Koren Shiver, RN       PFT Results Latest Ref Rng & Units 09/21/2018 05/23/2018 12/25/2017 10/04/2017 04/06/2017 10/14/2016  FVC-Pre L - 2.89 2.88 2.94 2.94 3.00  FVC-Predicted Pre % - 61 61 62 62 63  FVC-Post L - - - - - 3.00  FVC-Predicted Post % - - - - - 63  Pre FEV1/FVC % % - 91 86 92 91 90  Post FEV1/FCV % % - - - - - 92  FEV1-Pre L - 2.62 2.47 2.69 2.69 2.70  FEV1-Predicted Pre % - 77 72 79 78 78  FEV1-Post L - - - - - 2.75  DLCO uncorrected ml/min/mmHg 14.95 17.13 14.56 15.04 16.43 16.82  DLCO UNC% % 42 47 40 41 45 46  DLCO corrected ml/min/mmHg 15.26 17.33 15.57 15.31 19.35 -  DLCO COR %Predicted % 43 47 42 42 53 -  DLVA Predicted % 83 91 83 79 99 81  TLC L - - - - - 4.62  TLC % Predicted % - - - - - 60  RV % Predicted % - - - - - 60    Lab Results  Component Value Date   NITRICOXIDE TEST WILL BE CREDITED 12/19/2016        Rexene Edison, NP 12/24/2021

## 2021-12-24 NOTE — Assessment & Plan Note (Signed)
Cont current regimen

## 2022-01-07 ENCOUNTER — Telehealth: Payer: Self-pay | Admitting: Internal Medicine

## 2022-01-07 NOTE — Telephone Encounter (Signed)
Mr. Peak was called on 07-Jan-2022 at 12:17 PM. I spoke with his wife [name], who is authorized to receive verbal disclosures regarding his care and involvement in research. She was informed of the inadvertent discrepancy in the final version of the ICF form during the re-consenting process on 24-Dec-2021. The discrepancy was found in section 8.0 Compensation, where it indicates OLE participants would receive a stipend. She  was informed that it was incorrect, and that OLE participants will not be provided a stipend during this phase of the study trial. She acknowledged the correction, and verbalized his decision to continue in the study trial. She was informed that formal documentation will be included in his paper source subject binder, and through this note that is routed to the PI Dr. Chase Caller.

## 2022-01-07 NOTE — Telephone Encounter (Signed)
PI OVERSIGHT ATTESTATION  I the Principal Investigator (PI) for the above mentioned study attest that I reviewed the mentioned  clinical research coordinator  notes on research subject  [place]  born 01-08-1941 . I  agree with the findings mentioned above   Dr.Marshe Shrestha Chase Caller, MD Pulmonary and [place] Staff Physician [facility] Pager: [phone], If no answer or between  15:00h - 7:00h: call [phone]  01/07/2022 3:48 PM

## 2022-01-14 ENCOUNTER — Ambulatory Visit: Payer: Medicare Other

## 2022-01-17 ENCOUNTER — Other Ambulatory Visit: Payer: Self-pay

## 2022-01-17 ENCOUNTER — Ambulatory Visit (INDEPENDENT_AMBULATORY_CARE_PROVIDER_SITE_OTHER): Payer: Medicare Other

## 2022-01-17 ENCOUNTER — Encounter: Payer: Medicare Other | Admitting: *Deleted

## 2022-01-17 VITALS — BP 109/68 | HR 59 | Temp 97.3°F | Resp 16

## 2022-01-17 DIAGNOSIS — J84112 Idiopathic pulmonary fibrosis: Secondary | ICD-10-CM

## 2022-01-17 DIAGNOSIS — Z006 Encounter for examination for normal comparison and control in clinical research program: Secondary | ICD-10-CM

## 2022-01-17 MED ORDER — STUDY - FIBROGEN - PAMREVLUMAB 10 MG/ML IV INFUSION (OPEN LABEL) (PI-RAMASWAMY)
30.0000 mg/kg | Freq: Once | INTRAVENOUS | Status: AC
  Administered 2022-01-17: 2940 mg via INTRAVENOUS
  Filled 2022-01-17: qty 294

## 2022-01-17 NOTE — Progress Notes (Signed)
Diagnosis: Idiopathic Pulmonary Fibrosis (IPF)  Provider:  Marshell Garfinkel, MD  Procedure: Infusion  IV Type: Peripheral, IV Location: R Antecubital  FIBROGEN (Pamrevlumab), Dose: 2940mg   Infusion Start Time: 1011  Infusion Stop Time: 1046  Post Infusion IV Care: Observation period completed and Peripheral IV Discontinued  Discharge: Condition: Good, Destination: Home .  Performed by:  Koren Shiver, RN

## 2022-01-17 NOTE — Research (Signed)
Title: FGCL-3019-091 Open-Label Extension (OLE) is a multi-center, single-arm, open-label extension (OLE) phase where subjects who complete the Week 48 visit of the main study are eligible to participate. The extension to the main study allows for the continued evaluation of the efficacy and safety of 30 mg/kg IV infusions of pamrevlumab administered every 3 weeks for 52 weeks in subjects with Idiopathic Pulmonary Fibrosis.   Primary endpoint is: To provide continued access to pamrevlumab in subjects with IPF who completed the Week 48 visit of the main study.  Protocol #: [id number], Clinical Trials #: [id number] Sponsor: [email or web address]  ([place], [place], [place])  [facility] [employer] Coordinator note :   This visit for Subject Shane [name] with DOB: December 03, 1941 on 01/17/2022 for the above protocol is Visit/Encounter # Ex Week 114 and is for purpose of research.   The consent for this encounter is under Protocol: Protocol Amendment 6.0 (Approved date: 31OCT2022) IB: version 20 dated [id number] (Approved [id number])  Main ICF: version [id number], revised 16Dec22 Optional Genetic Consent: version [id number], revised 16Dec22 OLE ICF: version [id number], revised 16Dec22  Subject expressed continued interest and consent in continuing as a study subject. Subject confirmed that there was no change in contact information (e.g. address, telephone, email). Subject thanked for participation in research and contribution to science.  The Subject was informed that the PI Dr. Chase Caller continues to have oversight of the subject's visits and course through relevant discussions, reviews and also specifically of this visit by routing of this note to the [place].  Patient appeared well today. He reported no new complaints since his last visit. The patient received and tolerated the infusion well. Please refer to paper source subject binder for further details.     Signed by  Lytle Michaels   Clinical Research Coordinator  [facility]  [place], [place] 11:34 AM 01/17/2022

## 2022-02-04 ENCOUNTER — Encounter: Payer: Medicare Other | Admitting: *Deleted

## 2022-02-04 ENCOUNTER — Ambulatory Visit (INDEPENDENT_AMBULATORY_CARE_PROVIDER_SITE_OTHER): Payer: Medicare Other

## 2022-02-04 ENCOUNTER — Other Ambulatory Visit: Payer: Self-pay

## 2022-02-04 VITALS — BP 113/69 | HR 75 | Temp 98.0°F | Resp 19

## 2022-02-04 DIAGNOSIS — J84112 Idiopathic pulmonary fibrosis: Secondary | ICD-10-CM

## 2022-02-04 DIAGNOSIS — Z006 Encounter for examination for normal comparison and control in clinical research program: Secondary | ICD-10-CM

## 2022-02-04 MED ORDER — STUDY - FIBROGEN - PAMREVLUMAB 10 MG/ML IV INFUSION (OPEN LABEL) (PI-RAMASWAMY)
30.0000 mg/kg | Freq: Once | INTRAVENOUS | Status: AC
  Administered 2022-02-04: 2940 mg via INTRAVENOUS
  Filled 2022-02-04: qty 294

## 2022-02-04 NOTE — Research (Signed)
Title: FGCL-3019-091 Open-Label Extension (OLE) is a multi-center, single-arm, open-label extension (OLE) phase where subjects who complete the Week 48 visit of the main study are eligible to participate. The extension to the main study allows for the continued evaluation of the efficacy and safety of 30 mg/kg IV infusions of pamrevlumab administered every 3 weeks for 52 weeks in subjects with Idiopathic Pulmonary Fibrosis.  ? ?Primary endpoint is: To provide continued access to pamrevlumab in subjects with IPF who completed the Week 48 visit of the main study. ? ?Protocol #: [id number], Clinical Trials #: [id number] Sponsor: [email or web address]  ?([place], [place], [place]) ? ?[facility] [place] Coordinator note :  ? ?This visit for Subject Shane [name] with DOB: November 10, 1941 on 02/04/2022 for the above protocol is Visit/Encounter # EX Week 117  and is for purpose of research.  ? ?The consent for this encounter is under Protocol: Protocol Amendment 6.0 (Approved date: 31OCT2022) ? IB: version 20 dated [id number] (Approved [id number])  ?Main ICF: version [id number], revised 16Dec22 ?Optional Genetic Consent: version [id number], revised 16Dec22 ?OLE ICF: version [id number], revised [id number] and is IRB approved.  ? ?Subject expressed continued interest and consent in continuing as a study subject. Subject confirmed that there was  no change in contact information (e.g. address, telephone, email). Subject thanked for participation in research and contribution to science.   ?The Subject was informed that the PI Dr. Chase Caller continues to have oversight of the subject's visits and course  through relevant discussions, reviews and also specifically of this visit by routing of this note to the [place]. ? ? ?All procedures completed per the above mentioned protocol. Subject tolerated infusion well without complaints. Refer to the subjects paper source binder for further details of the visit.  ? ? ?Signed  by ?Hazel Green Bing, CMA, BS, CCRC ?Clinical Research Coordinator  ?[facility]  ?[place], [place] ?12:54 PM 02/04/2022 ? ?

## 2022-02-04 NOTE — Research (Signed)
Title: Chronic Fibrosing Interstitial Lung Disease with Progressive Phenotype Prospective Outcomes (ILD-PRO) Registry  ?  ?Protocol #: IPF-PRO-SUB, Clinical Trials # [id number], Sponsor: [employer] ?  ?Protocol Version Amendment 4 dated 12Sep2019  and confirmed current on  ?Consent Version for today?s visit date of  Is [facility] IRB Approved Version 08 Nov 2018 Revised 08 Nov 2018 ?  ?Objectives:  ?Describe current approaches to diagnosis and treatment of chronic fibrosing ILDs with progressive phenotype  ?Describe the natural history of chronic fibrosing ILDs with progressive phenotype  ?Assess quality of life from self-administered participant reported questionnaires for each disease group  ?Describe participant interactions with the healthcare system, describe treatment practices across multiple institutions for each disease group  ?Collect biological samples linked to well characterized chronic fibrosing ILDs with progressive phenotype to identify disease biomarkers  ?Collect data and biological samples that will support future research studies.  ?                                          ?Key Inclusion Criteria: ?Willing and able to provide informed consent  ?Age ? 30 years  ?Diagnosis of a non-IPF ILD of any duration, including, but not limited to Idiopathic Non-Specific Interstitial Pneumonia (INSIP), Unclassifiable Idiopathic Interstitial Pneumonias (IIPs), Interstitial Pneumonia with Autoimmune Features (IPAF), Autoimmune ILDs such as Rheumatoid Arthritis (RA-ILD) and Systemic Sclerosis (SSC-ILD), Chronic Hypersensitivity Pneumonitis (HP), Sarcoidosis or Exposure-related ILDs such as asbestosis.  ?Chronic fibrosing ILD defined by reticular abnormality with traction bronchiectasis with or without honeycombing confirmed by chest HRCT scan and/or lung biopsy.  ?Progressive phenotype as defined by fulfilling at least one of the criteria below of fibrotic changes (progression set point)  within the last 24 months regardless of treatment considered appropriate in individual ILDs:  ?decline in FVC % predicted (% pred) based on >10% relative decline  ?decline in FVC % pred based on ? 5 - <10% relative decline in FVC combined with worsening of respiratory symptoms as assessed by the site investigator  ?decline in FVC % pred based on ? 5 - <10% relative decline in FVC combined with increasing extent of fibrotic changes on chest imaging (HRCT scan) as assessed by the site investigator  ?decline in DLCO % pred based on ? 10% relative decline  ?worsening of respiratory symptoms as well as increasing extent of fibrotic changes on chest imaging (HRCT scan) as assessed by the site investigator independent of FVC change.   ?  ?Key Exclusion Criteria: ?Malignancy, treated or untreated, other than skin or early stage prostate cancer, within the past 5 years  ?Currently listed for lung transplantation at the time of enrollment  ?Currently enrolled in a clinical trial at the time of enrollment in this registry   ?  ?  ?Clinical Research Coordinator / Research RN note : This visit for Shane [name]. [place] Subject [id number] with DOB: 29-Mar-1941 on 02/04/2022 for the above protocol is Visit/Encounter #10 and is for purpose of research.  ?  ?Subject expressed continued interest and consent in continuing as a study subject. Subject confirmed that there was no change in contact information (e.g. address, telephone, email). Subject thanked for participation in research and contribution to science.   ?  ? During this visit on 02/04/2022 , the subject completed the blood work and questionnaires per the above referenced protocol. Please refer to the subject's paper source binder for further details. ?  ?  Signed by ?Leda Gauze Jerol Rufener ?Research Assistant ?[facility]  ?[place], [place] ?12:33 PM 02/04/2022 ?  ?

## 2022-02-04 NOTE — Progress Notes (Signed)
Diagnosis: Idiopathic Pulmonary Fibrosis (IPF) ? ?Provider:  Marshell Garfinkel, MD ? ?Procedure: Infusion ? ?IV Type: Peripheral, IV Location: R Forearm ? ?Fibrogen (Pamrevlumab), Dose: 2940 mg ? ?Infusion Start Time: 10.26 02/04/2022 ? ?Infusion Stop Time: 10.56  02/04/2022 ? ?Post Infusion IV Care: Observation period completed and Peripheral IV Discontinued ? ?Discharge: Condition: Good, Destination: Home . AVS provided to patient.  ? ?Performed by:  Rindi Beechy, RN  ?  ?

## 2022-02-25 ENCOUNTER — Ambulatory Visit: Payer: Medicare Other

## 2022-02-28 ENCOUNTER — Encounter: Payer: Medicare Other | Admitting: *Deleted

## 2022-02-28 ENCOUNTER — Other Ambulatory Visit: Payer: Self-pay

## 2022-02-28 ENCOUNTER — Ambulatory Visit (INDEPENDENT_AMBULATORY_CARE_PROVIDER_SITE_OTHER): Payer: Medicare Other

## 2022-02-28 VITALS — BP 154/79 | HR 80 | Temp 97.3°F | Resp 18 | Wt 225.0 lb

## 2022-02-28 DIAGNOSIS — J84112 Idiopathic pulmonary fibrosis: Secondary | ICD-10-CM

## 2022-02-28 DIAGNOSIS — Z006 Encounter for examination for normal comparison and control in clinical research program: Secondary | ICD-10-CM

## 2022-02-28 MED ORDER — STUDY - FIBROGEN - PAMREVLUMAB 10 MG/ML IV INFUSION (OPEN LABEL) (PI-RAMASWAMY)
30.0000 mg/kg | Freq: Once | INTRAVENOUS | Status: AC
  Administered 2022-02-28: 3060 mg via INTRAVENOUS
  Filled 2022-02-28: qty 306

## 2022-02-28 NOTE — Research (Signed)
Title: FGCL-3019-091 Open-Label Extension (OLE) is a multi-center, single-arm, open-label extension (OLE) phase where subjects who complete the Week 48 visit of the main study are eligible to participate. The extension to the main study allows for the continued evaluation of the efficacy and safety of 30 mg/kg IV infusions of pamrevlumab administered every 3 weeks for 52 weeks in subjects with Idiopathic Pulmonary Fibrosis.  ? ?Primary endpoint is: To provide continued access to pamrevlumab in subjects with IPF who completed the Week 48 visit of the main study. ? ?Protocol #: FGCL-3019-091, Clinical Trials #: [id number] Sponsor: [email or web address]  ?([place], [place], [place]) ? ?[facility] [place] Coordinator note :  ? ?This visit for Subject Shane [name] with DOB: [birth year] on 02/28/2022 for the above protocol is Visit/Encounter # Ex Week 120 and is for purpose of research.  ? ?The consent for this encounter is under Protocol: Protocol Amendment 6.0 (Approved date: 31OCT2022) ?IB: version 20 dated [id number] (Approved [id number])  ?Main ICF: version [id number], revised 16Dec22 ?Optional Genetic Consent: version [id number], revised 16Dec22 ?OLE ICF: version [id number], revised 16Dec22 ? ?Subject expressed continued interest and consent in continuing as a study subject. Subject confirmed that there was no change in contact information (e.g. address, telephone, email). Subject thanked for participation in research and contribution to science.  ? ?The Subject was informed that the PI Dr. Chase Caller continues to have oversight of the subject's visits and course  through relevant discussions, reviews and also specifically of this visit by routing of this note to the PI. ? ? ?Patient appeared well today. He reported no new changes since his last research visit. He shared his left foot ulcer is continuing to heal nicely. He received and tolerated the infusion well. Please refer to paper source subject  binder for further details.   ? ? ?Signed by ? ?Lytle Michaels  ?Clinical Research Coordinator ?[facility]  ?[place], [place] ?11:22 AM 02/28/2022  ?

## 2022-02-28 NOTE — Progress Notes (Signed)
Diagnosis: Idiopathic Pulmonary Fibrosis (IPF) ? ?Provider:  Marshell Garfinkel, MD ? ?Procedure: Infusion ? ?IV Type: Peripheral, IV Location: R Antecubital ? ?Fibrogen (Pamrevlumab), Dose: 3060 mg ? ?Infusion Start Time: 10.04 ? ?Infusion Stop Time: 10.38 ? ?Post Infusion IV Care: Observation period completed and Peripheral IV Discontinued ? ?Discharge: Condition: Good, Destination: Home . AVS provided to patient.  ? ?Performed by:  Equilla Que, RN  ?  ?

## 2022-03-14 ENCOUNTER — Telehealth (HOSPITAL_COMMUNITY): Payer: Self-pay | Admitting: Cardiology

## 2022-03-14 NOTE — Telephone Encounter (Signed)
Patient spouse called and cancelled echocardiogram for reason below: ? ?03/14/22 spouse cancelled and state that she thinks patient had while in the hospital at [facility]. She will call back if he needs.  ? ?Order will be removed from the echo WQ.  ?

## 2022-03-15 ENCOUNTER — Ambulatory Visit (HOSPITAL_COMMUNITY): Payer: Medicare Other

## 2022-03-18 ENCOUNTER — Ambulatory Visit (INDEPENDENT_AMBULATORY_CARE_PROVIDER_SITE_OTHER): Payer: Medicare Other

## 2022-03-18 ENCOUNTER — Encounter: Payer: Medicare Other | Admitting: *Deleted

## 2022-03-18 VITALS — BP 128/70 | HR 59 | Temp 97.7°F | Resp 18

## 2022-03-18 DIAGNOSIS — J84112 Idiopathic pulmonary fibrosis: Secondary | ICD-10-CM

## 2022-03-18 DIAGNOSIS — Z006 Encounter for examination for normal comparison and control in clinical research program: Secondary | ICD-10-CM

## 2022-03-18 MED ORDER — STUDY - FIBROGEN - PAMREVLUMAB 10 MG/ML IV INFUSION (OPEN LABEL) (PI-RAMASWAMY)
30.0000 mg/kg | Freq: Once | INTRAVENOUS | Status: AC
  Administered 2022-03-18: 3060 mg via INTRAVENOUS
  Filled 2022-03-18: qty 306

## 2022-03-18 NOTE — Research (Signed)
Title: [id number] Open-Label Extension (OLE) is a multi-center, single-arm, open-label extension (OLE) phase where subjects who complete the Week 48 visit of the main study are eligible to participate. The extension to the main study allows for the continued evaluation of the efficacy and safety of 30 mg/kg IV infusions of pamrevlumab administered every 3 weeks for 52 weeks in subjects with Idiopathic Pulmonary Fibrosis.  ? ?Primary endpoint is: To provide continued access to pamrevlumab in subjects with IPF who completed the Week 48 visit of the main study. ? ?Protocol #: [id number], Clinical Trials #: [id number] Sponsor: [email or web address]  ?([place], [place], [place]) ? ?Protocol: Protocol Amendment 6.0 (Approved date: 31OCT2022) ?IB: version 20 dated [id number] (Approved [id number])  ?Main ICF: version [id number], revised 16Dec22 ?Optional Genetic Consent: version [id number], revised 16Dec22 ?OLE ICF: version [id number], revised 16Dec22 ? ?[facility] [place] Coordinator note :  ? ?This visit for Subject Shane [name] with DOB: 12-17-1940 on 03/18/2022 for the above protocol is Visit/Encounter # Ex Week 123 and is for purpose of research.  ? ?The consent for this encounter is under Protocol: Protocol Amendment 6.0 (Approved date: 31OCT2022) ?IB: version 20 dated [id number] (Approved [id number])  ?Main ICF: version [id number], revised 16Dec22 ?Optional Genetic Consent: version [id number], revised 16Dec22 ?OLE ICF: version [id number], revised 16Dec22 ? ?Subject expressed continued interest and consent in continuing as a study subject. Subject confirmed that there was no change in contact information (e.g. address, telephone, email). Subject thanked for participation in research and contribution to science.   ? ?The Subject was informed that the PI Dr. Chase Caller continues to have oversight of the subject's visits and course  through relevant discussions, reviews and also specifically of this visit by  routing of this note to the [place]. ? ?The patient appeared well today. He reported no new complaints since his last research visit. 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 He received and tolerated the infusion well. Please refer to paper source subject binder for further details. ? ? ? ? ?Signed by ? ?Lytle Michaels  ?Clinical Research Coordinator ?[facility]  ?[place], [place] ?11:29 AM 03/18/2022  ?

## 2022-03-18 NOTE — Progress Notes (Signed)
Diagnosis: Idiopathic Pulmonary Fibrosis (IPF) ? ?Provider:  Marshell Garfinkel, MD ? ?Procedure: Infusion ? ?IV Type: Peripheral, IV Location: R Antecubital ? ?Fibrogen (Pamrevlumab), Dose: 3060mg  ? ?Infusion Start Time: 3912 ? ?Infusion Stop Time: 1045 ? ?Post Infusion IV Care: Observation period completed and Peripheral IV Discontinued ? ?Discharge: Condition: Good, Destination: Home .  ? ?Performed by:  Koren Shiver, RN  ?  ?

## 2022-04-05 ENCOUNTER — Encounter: Payer: Medicare Other | Admitting: *Deleted

## 2022-04-05 ENCOUNTER — Ambulatory Visit (INDEPENDENT_AMBULATORY_CARE_PROVIDER_SITE_OTHER): Payer: Medicare Other

## 2022-04-05 VITALS — BP 122/66 | HR 63 | Temp 97.6°F | Resp 16

## 2022-04-05 DIAGNOSIS — Z006 Encounter for examination for normal comparison and control in clinical research program: Secondary | ICD-10-CM

## 2022-04-05 DIAGNOSIS — J84112 Idiopathic pulmonary fibrosis: Secondary | ICD-10-CM

## 2022-04-05 MED ORDER — STUDY - FIBROGEN - PAMREVLUMAB 10 MG/ML IV INFUSION (OPEN LABEL) (PI-RAMASWAMY)
30.0000 mg/kg | Freq: Once | INTRAVENOUS | Status: AC
  Administered 2022-04-05: 3060 mg via INTRAVENOUS
  Filled 2022-04-05: qty 306

## 2022-04-05 NOTE — Research (Signed)
Title: [id number] Open-Label Extension (OLE) is a multi-center, single-arm, open-label extension (OLE) phase where subjects who complete the Week 48 visit of the main study are eligible to participate. The extension to the main study allows for the continued evaluation of the efficacy and safety of 30 mg/kg IV infusions of pamrevlumab administered every 3 weeks for 52 weeks in subjects with Idiopathic Pulmonary Fibrosis.  ? ?Primary endpoint is: To provide continued access to pamrevlumab in subjects with IPF who completed the Week 48 visit of the main study. ? ?Protocol #: [id number], Clinical Trials #: [id number] Sponsor: [email or web address]  ?([place], [place], [place]) ? ?[facility] [place] Coordinator note :  ? ?This visit for Subject Shane [name] with DOB: 12/07/1940 on 04/05/2022 for the above protocol is Visit/Encounter # Ex Week 126 and is for purpose of research.  ? ?The consent for this encounter is under Protocol: Protocol Amendment 6.0 (Approved date: 31OCT2022) ?IB: version 20 dated [id number] (Approved [id number])  ?Main ICF: version [id number], revised 16Dec22 ?Optional Genetic Consent: version [id number], revised 16Dec22 ?OLE ICF: version [id number], revised 16Dec22 ? ?Subject expressed continued interest and consent in continuing as a study subject. Subject confirmed that there was no change in contact information (e.g. address, telephone, email). Subject thanked for participation in research and contribution to science.  ? ?The Subject was informed that the PI Dr. Chase Caller continues to have oversight of the subject's visits and course  through relevant discussions, reviews and also specifically of this visit by routing of this note to the [place]. ? ? ?The patient appeared well today. He reported that his foot wound healing is progressing nicely. He received and tolerated the infusion well today. Please refer to paper source subject binder for further details.  ? ? ? ?Signed  by ? ?Sonia ?Clinical Research Coordinator ?[facility]  ?[place], [place] ?1:38 PM 04/05/2022  ?

## 2022-04-05 NOTE — Progress Notes (Signed)
Diagnosis: Idiopathic Pulmonary Fibrosis (IPF) ? ?Provider:  Marshell Garfinkel, MD ? ?Procedure: Infusion ? ?IV Type: Peripheral, IV Location: R Antecubital ? ?Fibrogen (Pamrevlumab), Dose: 3,060mg  ? ?Infusion Start Time: 1016 ? ?Infusion Stop Time: 2423 ? ?Post Infusion IV Care: Observation period completed and Peripheral IV Discontinued ? ?Discharge: Condition: Good, Destination: Home .  ? ?Performed by:  Koren Shiver, RN  ?  ?

## 2022-04-08 ENCOUNTER — Ambulatory Visit: Payer: Medicare Other

## 2022-04-20 ENCOUNTER — Other Ambulatory Visit: Payer: Self-pay

## 2022-04-20 ENCOUNTER — Telehealth: Payer: Self-pay | Admitting: Orthopedic Surgery

## 2022-04-20 DIAGNOSIS — Z89511 Acquired absence of right leg below knee: Secondary | ICD-10-CM

## 2022-04-20 NOTE — Telephone Encounter (Signed)
Patient's wife called. She would like a referral for patient to see Shirlean Mylar for physical therapy. Her call back number is [phone] ?

## 2022-04-20 NOTE — Telephone Encounter (Signed)
Order in chart for physical therapy. 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 Can you call to schedule please?  ?

## 2022-04-25 ENCOUNTER — Encounter (INDEPENDENT_AMBULATORY_CARE_PROVIDER_SITE_OTHER): Payer: Self-pay | Admitting: Ophthalmology

## 2022-04-25 ENCOUNTER — Ambulatory Visit (INDEPENDENT_AMBULATORY_CARE_PROVIDER_SITE_OTHER): Payer: Medicare Other | Admitting: Ophthalmology

## 2022-04-25 DIAGNOSIS — G4733 Obstructive sleep apnea (adult) (pediatric): Secondary | ICD-10-CM | POA: Diagnosis not present

## 2022-04-25 DIAGNOSIS — H353132 Nonexudative age-related macular degeneration, bilateral, intermediate dry stage: Secondary | ICD-10-CM | POA: Insufficient documentation

## 2022-04-25 DIAGNOSIS — Z9989 Dependence on other enabling machines and devices: Secondary | ICD-10-CM

## 2022-04-25 DIAGNOSIS — E113592 Type 2 diabetes mellitus with proliferative diabetic retinopathy without macular edema, left eye: Secondary | ICD-10-CM | POA: Diagnosis not present

## 2022-04-25 DIAGNOSIS — E113591 Type 2 diabetes mellitus with proliferative diabetic retinopathy without macular edema, right eye: Secondary | ICD-10-CM

## 2022-04-25 DIAGNOSIS — Z794 Long term (current) use of insulin: Secondary | ICD-10-CM

## 2022-04-25 NOTE — Assessment & Plan Note (Signed)
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 Checking an Amsler Grid daily with advice to return immediately should a distortion develop, was given to the patient. The patient 's smoking status now and in the past was determined and advice based on the AREDS study was provided regarding the consumption of antioxidant supplements. AREDS 2 vitamin formulation was recommended. Consumption of dark leafy vegetables and fresh fruits of various colors was recommended. Treatment modalities for wet macular degeneration particularly the use of intravitreal injections of anti-blood vessel growth factors was discussed with the patient. Avastin, Lucentis, and Eylea are the available options. On occasion, therapy includes the use of photodynamic therapy and thermal laser. Stressed to the patient do not rub eyes.  Patient was advised to check Amsler Grid daily and return immediately if changes are noted. Instructions on using the grid were given to the patient. All patient questions were answered.

## 2022-04-25 NOTE — Progress Notes (Signed)
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 male who presents to the clinic today for:   HPI   9 mos fu OU OCT FP. "No changes really. Light affects it more I think. I can't stand to be in the bright sun, the bright light I need sunglasses. A little blurred close up." LBS: this morning 116 per patient. A1C: 7.2, a couple weeks ago per patient. Patient uses OTC eye drops for allergies.  Patient was in the hospital in September and October, amputated toes off left foot. Last edited by Laurin Coder on 04/25/2022  9:17 AM.      Referring physician: Monna Fam, MD [place],  [place] [id number]  HISTORICAL INFORMATION:   Selected notes from the MEDICAL RECORD NUMBER    Lab Results  Component Value Date   HGBA1C 7.8 (H) 02/15/2021     CURRENT MEDICATIONS: Current Outpatient Medications (Ophthalmic Drugs)  Medication Sig   Olopatadine HCl (PATADAY OP) Place 1 drop into both eyes daily as needed (allergies).   No current facility-administered medications for this visit. (Ophthalmic Drugs)   Current Outpatient Medications (Other)  Medication Sig   acetaminophen (TYLENOL) 325 MG tablet Take 1-2 tablets (325-650 mg total) by mouth every 6 (six) hours as needed for mild pain (pain score 1-3 or temp > 100.5).   albuterol (VENTOLIN HFA) 108 (90 Base) MCG/ACT inhaler Inhale 2 puffs into the lungs every 4 (four) hours as needed for wheezing or shortness of breath.   apixaban (ELIQUIS) 5 MG TABS tablet Take 1 tablet (5 mg total) by mouth 2 (two) times daily.   Ascorbic Acid (VITAMIN C WITH ROSE HIPS) 500 MG tablet Take 1 tablet (500 mg total) by mouth daily.   atorvastatin (LIPITOR) 80 MG tablet Take 1 tablet (80 mg total) by mouth daily.   docusate sodium (COLACE) 100 MG capsule Take 1 capsule (100  mg total) by mouth daily.   doxycycline (VIBRA-TABS) 100 MG tablet Take 1 tablet (100 mg total) by mouth 2 (two) times daily.   escitalopram (LEXAPRO) 10 MG tablet Take 1 tablet (10 mg total) by mouth daily.   furosemide (LASIX) 40 MG tablet Take 1 tablet (40 mg total) by mouth daily.   gabapentin (NEURONTIN) 300 MG capsule TAKE 1 CAPSULE BY MOUTH THREE TIMES A DAY   insulin aspart (NOVOLOG FLEXPEN) 100 UNIT/ML FlexPen Inject 6 Units into the skin 3 (three) times daily with meals.   insulin glargine (LANTUS) 100 UNIT/ML Solostar Pen Inject 8 Units into the skin at bedtime.   iron polysaccharides (NIFEREX) 150 MG capsule Take 1 capsule (150 mg total) by mouth daily with lunch.   LORazepam (ATIVAN) 1 MG tablet Take 1 tablet (1 mg total) by mouth at bedtime.   magnesium oxide (MAG-OX) 400 (240 Mg) MG tablet Take 1 tablet (400 mg total) by mouth daily.   metoprolol tartrate (LOPRESSOR) 50 MG tablet Take 0.5 tablets (25 mg total) by mouth daily.   Multiple Vitamin (MULTIVITAMIN WITH MINERALS) TABS tablet Take 1 tablet by mouth daily.   pantoprazole (PROTONIX) 40 MG tablet Take 1 tablet (40 mg total) by mouth 2 (two) times daily.   polyethylene glycol powder (GLYCOLAX/MIRALAX) 17 GM/SCOOP powder Take 17 g by mouth daily as needed for moderate constipation.   Probiotic  Product (PROBIOTIC PO) Take 1 capsule by mouth daily.   traMADol (ULTRAM) 50 MG tablet Take 1 tablet (50 mg total) by mouth every 8 (eight) hours as needed for severe pain or moderate pain.   Zinc Oxide (TRIPLE PASTE) 12.8 % ointment Apply 1 application topically 2 (two) times daily. Apply to affected area   zinc sulfate 220 (50 Zn) MG capsule Take 1 capsule (220 mg total) by mouth daily.   No current facility-administered medications for this visit. (Other)      REVIEW OF SYSTEMS: ROS   Negative for: Constitutional, Gastrointestinal, Neurological, Skin, Genitourinary, Musculoskeletal, HENT, Endocrine, Cardiovascular, Eyes,  Respiratory, Psychiatric, Allergic/Imm, Heme/Lymph Last edited by Hurman Horn, MD on 04/25/2022  9:41 AM.       ALLERGIES Allergies  Allergen Reactions   Codeine Hives and Itching   Ofev [Nintedanib] Diarrhea    SEVERE DIARRHEA   Pirfenidone Diarrhea and Other (See Comments)    Esbriet (Pirfenidone) causes elevated LFTs. 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 10/2019 Echo: EF 55-60%, Gr2 DD. Nl RV.    Neuromuscular disorder ([place])    Neuropathy associated with endocrine disorder ([place])    Nonobstructive CAD (coronary artery disease)    a. 2012 Cath: mod, nonobs dzs; b. 10/2016 MV: EF 60%, no ischemia.   OSA on CPAP 05/05/2017   Unattended Home Sleep Test 7/2/813-AHI 38.6/hour, desaturation to 64%, body weight 261 pounds   PONV (postoperative nausea and vomiting)    Sleep apnea     uses cpap asked to bring mask and tubing   Past Surgical History:  Procedure Laterality Date   ABDOMINAL AORTOGRAM W/LOWER EXTREMITY N/A 12/10/2020   Procedure: ABDOMINAL AORTOGRAM W/LOWER EXTREMITY;  Surgeon: Cherre Robins, MD;  Location: [facility];  Service: Cardiovascular;  Laterality: N/A;   AMPUTATION Right 01/22/2021   Procedure: 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;  Surgeon: [name], MD;  Location: [facility];  Service: Orthopedics;  Laterality: Right;   AMPUTATION Right 03/17/2021   Procedure: RIGHT BELOW KNEE AMPUTATION;  Surgeon: [name], MD;  Location: [facility];   Service: Orthopedics;  Laterality: Right;   ANKLE FUSION Right 01/22/2021   Procedure: RIGHT FOOT TIBIOCALCANEAL FUSION;  Surgeon: [name], MD;  Location: [facility];  Service: Orthopedics;  Laterality: Right;   ANTERIOR FUSION CERVICAL SPINE  2012   CARDIAC CATHETERIZATION  2011   CARDIAC CATHETERIZATION N/A 11/09/2016   Procedure: Right Heart Cath;  Surgeon: Belva Crome, MD;  Location: [facility];  Service: Cardiovascular;  Laterality: N/A;   carpel tunnel     left wrist   CATARACT EXTRACTION     CATARACT EXTRACTION W/ INTRAOCULAR LENS  IMPLANT, BILATERAL  2013   CERVICAL LAMINECTOMY  2012   COLONOSCOPY N/A 01/14/2013  Procedure: COLONOSCOPY;  Surgeon: Irene Shipper, MD;  Location: [facility];  Service: Endoscopy;  Laterality: N/A;   CORONARY ARTERY BYPASS GRAFT  11/04/2020   LIMA-LAD, SVG-OM1, SVG-PDA (Dr Marney [facility]) dc 11/18/2020   EYE SURGERY     I & D EXTREMITY Right 02/19/2021   Procedure: RIGHT ANKLE DEBRIDEMENT AND PLACEMENT ANTIBIOTIC BEADS;  Surgeon: [name], MD;  Location: [facility];  Service: Orthopedics;  Laterality: Right;   KNEE SURGERY  1998   left   LEFT HEART CATH AND CORONARY ANGIOGRAPHY N/A 07/10/2020   Procedure: LEFT HEART CATH AND CORONARY ANGIOGRAPHY;  Surgeon: Sherren Mocha, MD;  Location: [facility];  Service: Cardiovascular;  Laterality: N/A;   LUMBAR LAMINECTOMY  2003   LUNG BIOPSY Left 12/26/2016   Procedure: LUNG BIOPSY;  Surgeon: [name], MD;  Location: [facility];  Service: Thoracic;  Laterality: Left;   PACEMAKER IMPLANT N/A 03/30/2020   Procedure: PACEMAKER IMPLANT;  Surgeon: Evans Lance, MD;  Location: [facility];  Service: Cardiovascular;  Laterality: N/A;   PERIPHERAL VASCULAR INTERVENTION Right 12/10/2020   Procedure: PERIPHERAL VASCULAR INTERVENTION;  Surgeon: Cherre Robins, MD;  Location: [facility];  Service: Cardiovascular;  Laterality: Right;  SFA   POSTERIOR FUSION CERVICAL SPINE  2012    PPM GENERATOR REMOVAL N/A 12/14/2020   Procedure: PPM GENERATOR REMOVAL;  Surgeon: Evans Lance, MD;  Location: [facility];  Service: Cardiovascular;  Laterality: N/A;   TEE WITHOUT CARDIOVERSION N/A 12/11/2020   Procedure: TRANSESOPHAGEAL ECHOCARDIOGRAM (TEE);  Surgeon: Geralynn Rile, MD;  Location: [facility];  Service: Cardiovascular;  Laterality: N/A;   TRIGGER FINGER RELEASE  2011   4th finger left hand   VIDEO ASSISTED THORACOSCOPY Left 12/26/2016   Procedure: VIDEO ASSISTED THORACOSCOPY;  Surgeon: [name], MD;  Location: [facility];  Service: Thoracic;  Laterality: Left;   VIDEO BRONCHOSCOPY N/A 12/26/2016   Procedure: VIDEO BRONCHOSCOPY;  Surgeon: [name], MD;  Location: [facility];  Service: Thoracic;  Laterality: N/A;    FAMILY HISTORY Family History  Problem Relation Age of Onset   Diabetes Mellitus II Mother    Emphysema Father 76   Heart attack Father    Colon cancer Neg Hx    Esophageal cancer Neg Hx    Rectal cancer Neg Hx    Stomach cancer Neg Hx     SOCIAL HISTORY Social History   Tobacco Use   Smoking status: Never   Smokeless tobacco: Never  Vaping Use   Vaping Use: Never used  Substance Use Topics   Alcohol use: No   Drug use: No         OPHTHALMIC EXAM:  Base Eye Exam     Visual Acuity (ETDRS)       Right Left   Dist [place] 20/50 20/40 -2   Dist ph [place] 20/40 +2 20/30 -2         Tonometry (Tonopen, 9:21 AM)       Right Left   Pressure 15 11         Pupils       Pupils Dark Light React APD   Right PERRL 4 4 Minimal None   Left PERRL 4 3  None         Visual Fields       Left Right    Full Full         Extraocular Movement  Right Left    Full, Ortho Full, Ortho         Neuro/Psych     Oriented x3: Yes   Mood/Affect: Normal         Dilation     Both eyes: 1.0% Mydriacyl, 2.5% Phenylephrine @ 9:21 AM           Slit Lamp and Fundus Exam     External Exam        Right Left   External Normal Normal         Slit Lamp Exam       Right Left   Lids/Lashes Normal Normal   Conjunctiva/Sclera White and quiet White and quiet   Cornea Clear Clear   Anterior Chamber Deep and quiet Deep and quiet   Iris Round and reactive Round and reactive   Lens Posterior chamber intraocular lens Posterior chamber intraocular lens   Anterior Vitreous Normal Normal         Fundus Exam       Right Left   Posterior Vitreous Posterior vitreous detachment Posterior vitreous detachment   Disc Normal Normal   C/D Ratio 0.3 0.25   Macula Microaneurysms, Focal laser scars,,, no macular thickening Microaneurysms, Focal laser scars, no macular thickening   Vessels PDR QUIESCENT PDR QUIESCENT   Periphery Laser scars prp,, room nasally for prp   Laser scars , good prp 360            IMAGING AND PROCEDURES  Imaging and Procedures for 04/25/22  OCT, Retina - OU - Both Eyes       Right Eye Quality was good. Scan locations included subfoveal. Central Foveal Thickness: 295. Progression has been stable. Findings include retinal drusen , abnormal foveal contour.   Left Eye Quality was good. Scan locations included subfoveal. Central Foveal Thickness: 327. Progression has been stable. Findings include abnormal foveal contour, retinal drusen .   Notes No active maculopathy OU.     Color Fundus Photography Optos - OU - Both Eyes       Right Eye Progression has been stable. Disc findings include normal observations. Macula : normal observations. Periphery : normal observations.   Left Eye Disc findings include normal observations. Macula : normal observations, flat. Periphery : normal observations.   Notes OD with good PRP 360 degrees some room nasally.  No active CSME  OS with good PRP 360, with clear media.  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 Checking an Amsler Grid daily with advice to return immediately should a distortion develop, was given to the patient. The patient 's smoking status now and in the past was determined and advice based on the AREDS study was provided regarding the consumption of antioxidant supplements. AREDS 2 vitamin formulation was recommended. Consumption of dark leafy vegetables and fresh fruits of various colors was recommended. Treatment modalities for wet macular degeneration particularly the use of intravitreal injections of anti-blood vessel growth factors was discussed with the patient. Avastin, Lucentis, and Eylea are the available options. On occasion, therapy includes the use of photodynamic therapy and thermal laser. Stressed to the patient do not rub eyes.  Patient was advised to  check Amsler Grid daily and return immediately if changes are noted. Instructions on using the grid were given to the patient. All patient questions were answered.      ICD-10-CM   1. 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 Type 2 diabetes mellitus with right eye affected by proliferative retinopathy without macular edema, with long-term current use of insulin (HCC)  E11.3591 OCT, Retina - OU - Both Eyes   Z79.4 Color Fundus Photography Optos - OU - Both Eyes    3. OSA on CPAP  G47.33    Z99.89     4. Intermediate stage nonexudative age-related macular degeneration of both eyes  H35.3132       1.  OU with quiescent PDR.  No progression.  No active maculopathy from diabetic retinopathy.  We will continue to observe  2.   Intermediate ARMD OD you.  Not high risk.  No geographic atrophy thus not a candidate for treatment of dry AMD.  3.  Patient continues to be compliant with CPAP which is the most important treatment for transient hypoxia if it is present as it is in his case, nightly  if untreated  Ophthalmic Meds Ordered this visit:  No orders of the defined types were placed in this encounter.      Return in about 9 months (around 01/26/2023) for DILATE OU, COLOR FP, OCT.  There are no Patient Instructions on file for this visit.   Explained the diagnoses, plan, and follow up with the patient and they expressed understanding.  Patient expressed understanding of the importance of proper follow up care.   Clent Demark Ramsha Lonigro M.D. Diseases & Surgery of the Retina and Vitreous Retina & Diabetic [place] 04/25/22     Abbreviations: M myopia (nearsighted); A astigmatism; H hyperopia (farsighted); P presbyopia; Mrx spectacle prescription;  CTL contact lenses; OD right eye; OS left eye; OU both eyes  XT exotropia; ET esotropia; PEK punctate epithelial keratitis; PEE punctate epithelial erosions; DES dry eye syndrome; MGD meibomian gland dysfunction; ATs artificial tears; PFAT's preservative free artificial tears; [place] nuclear sclerotic cataract; PSC posterior subcapsular cataract; ERM epi-retinal membrane; PVD posterior vitreous detachment; RD retinal detachment; DM diabetes mellitus; DR diabetic retinopathy; NPDR non-proliferative diabetic retinopathy; PDR proliferative diabetic retinopathy; CSME clinically significant macular edema; DME diabetic macular edema; dbh dot blot hemorrhages; CWS cotton wool spot; POAG primary open angle glaucoma; C/D cup-to-disc ratio; HVF humphrey visual field; GVF goldmann visual field; OCT optical coherence tomography; IOP intraocular pressure; BRVO Branch retinal vein occlusion; CRVO central retinal vein occlusion; CRAO central retinal artery occlusion; BRAO branch retinal artery  occlusion; RT retinal tear; SB scleral buckle; PPV pars plana vitrectomy; VH Vitreous hemorrhage; PRP panretinal laser photocoagulation; IVK intravitreal kenalog; VMT vitreomacular traction; MH Macular hole;  NVD neovascularization of the disc; NVE neovascularization elsewhere; AREDS age related eye disease study; ARMD age related macular degeneration; POAG primary open angle glaucoma; EBMD epithelial/anterior basement membrane dystrophy; ACIOL anterior chamber intraocular lens; IOL intraocular lens; PCIOL posterior chamber intraocular lens; Phaco/IOL phacoemulsification with intraocular lens placement; [place] photorefractive keratectomy; LASIK laser assisted in situ keratomileusis; HTN hypertension; DM diabetes mellitus; COPD chronic obstructive pulmonary disease

## 2022-04-25 NOTE — Assessment & Plan Note (Signed)
Continues to be compliant with CPAP

## 2022-04-29 ENCOUNTER — Encounter (INDEPENDENT_AMBULATORY_CARE_PROVIDER_SITE_OTHER): Payer: Medicare Other | Admitting: Adult Health

## 2022-04-29 ENCOUNTER — Encounter: Payer: Self-pay | Admitting: Adult Health

## 2022-04-29 ENCOUNTER — Ambulatory Visit (INDEPENDENT_AMBULATORY_CARE_PROVIDER_SITE_OTHER): Payer: Medicare Other

## 2022-04-29 ENCOUNTER — Encounter: Payer: Medicare Other | Admitting: *Deleted

## 2022-04-29 VITALS — BP 103/63 | HR 60 | Temp 98.1°F | Resp 14

## 2022-04-29 DIAGNOSIS — Z006 Encounter for examination for normal comparison and control in clinical research program: Secondary | ICD-10-CM

## 2022-04-29 DIAGNOSIS — J84112 Idiopathic pulmonary fibrosis: Secondary | ICD-10-CM

## 2022-04-29 DIAGNOSIS — J8410 Pulmonary fibrosis, unspecified: Secondary | ICD-10-CM

## 2022-04-29 MED ORDER — STUDY - FIBROGEN - PAMREVLUMAB 10 MG/ML IV INFUSION (OPEN LABEL) (PI-RAMASWAMY)
30.0000 mg/kg | Freq: Once | INTRAVENOUS | Status: AC
  Administered 2022-04-29: 3060 mg via INTRAVENOUS
  Filled 2022-04-29: qty 306

## 2022-04-29 NOTE — Progress Notes (Signed)
Diagnosis: Infusion  Provider:  Marshell Garfinkel, MD  Procedure: Infusion  IV Type: Peripheral, IV Location: L Antecubital  Fibrogen (Pamrevlumab), Dose: 3,060mg   Infusion Start Time: 1032  Infusion Stop Time: 1110  Post Infusion IV Care: Observation period completed and Peripheral IV Discontinued  Discharge: Condition: Good, Destination: Home .   Performed by:  Koren Shiver, RN

## 2022-04-29 NOTE — Assessment & Plan Note (Signed)
Continue study protocol 

## 2022-04-29 NOTE — Assessment & Plan Note (Signed)
Cont current regimen

## 2022-04-29 NOTE — Research (Signed)
Title: FGCL-3019-091 Open-Label Extension (OLE) is a multi-center, single-arm, open-label extension (OLE) phase where subjects who complete the Week 48 visit of the main study are eligible to participate. The extension to the main study allows for the continued evaluation of the efficacy and safety of 30 mg/kg IV infusions of pamrevlumab administered every 3 weeks for 52 weeks in subjects with Idiopathic Pulmonary Fibrosis.   Primary endpoint is: To provide continued access to pamrevlumab in subjects with IPF who completed the Week 48 visit of the main study.  Protocol #: [id number], Clinical Trials #: [id number] Sponsor: [email or web address]  ([place], [place], [place])  Protocol: Protocol Amendment 6.0 (Approved date: 31OCT2022) IB: version 20 dated 02NOV2022 (Approved [id number])  Main ICF: version [id number], revised 16Dec22 Optional Genetic Consent: version [id number], revised 16Dec22 OLE ICF: version [id number], revised [id number]  Key Features of Pamrevlumab (FG-3019) the study drug: a recombinant fully human IgG kappa monoclonal antibody that binds to CTGF and is being developed for treatment of diseases in which tissue fibrosis has a major pathogenic role. In particular, pamrevlumab appears to disrupt a CTGF autocrine loop in mesenchymal cells like myofibroblasts that reduces their recruitment of leukocytes like macrophages, mast and dendritic cells via chemokine secretion. This disruption results in collapse of the cellular crosstalk that drives tissue remodeling.  Key Inclusion Criteria Completed the Week 48 visit of the main study.   The investigator considers the subject to be suitable for continued participation in the OLE phase, including ability to comply with study visits every 3 weeks for pamrevlumab infusions.  A signed and dated IRB/IEC-approved informed consent must be obtained before dosing start, and preferably at the Week 48 visit of the main study.  Male subjects with partners of  childbearing potential and male subjects of childbearing potential (including those <1 year postmenopausal) must continue to use highly effective contraception methods until 3 months after the last dose of study drug in the OLE phase.   Key Exclusion Criteria Male subjects who are pregnant or nursing.  Use of any investigational drugs, or participation in a clinical trial with an investigational new drug, other than participation in the main study.  History of allergic or anaphylactic reaction to human, humanized, chimeric or murine monoclonal antibodies, or experienced an allergic or anaphylactic reaction to study drug or to any component of the excipient while participating in the main study.  Subjects who withdrew informed consent while participating in the main study.  The investigator judges that the subject is not suitable for participation, or is unable to fully participate in the OLE phase and complete it for any reason, including inability to comply with study procedures and treatment, or any other relevant medical or psychiatric conditions.   Mechanism of Action Pamrevlumab is a human recombinant IgG monoclonal antibody that binds to connective tissue growth factor (CTGF).  CTGF plays a role by mediating the process of fibrosis. By binding to CTGF, pamrevlumab blocks its biologic activity; thereby preventing cell proliferation, adhesion and migration of growth factors involved in fibrotic changes. It is also being studied in other conditions where fibrotic changes play a role; liver fibrosis, Duchenne muscular dystrophy and certain cancers.   Half-life 58-141 hours; t1/2 increases with increasing doses   Interactions No known drug interactions. All concomitant medications to be reviewed by PI.  Safety Data 1102 subjects have been exposed to pamrevlumab, 520 with IPF The most common treatment emergent adverse events (TEAEs) in all subjects with IPF include: cough, fatigue, dyspnea,  upper respiratory tract infection, bronchitis,  nasopharyngitis No known effect on qtc prolongation, renal or hepatic issues Cleared Shane [name] for protocol to continue without changes  Updates: 08-Oct-2021: As of this date 2 cases of Serious Adverse Event (SAE) anaphylactic reaction reported. The sponsor does not believe that changes to the conduct of this study are warranted at this time.  30-Aug-2021: Embryo fetal development study findings in male rabbits treated with pamrevlumab determined potential risks for pregnant male patients that could cause fetal abnormalities. Per sponsor, there are no recommended changes to safety monitoring, study procedures, or study conduct as a result of this finding. Per PI, no safety or data integrity has been compromised.    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 The sponsor does not believe that changes to the conduct of this study are warranted at this time.  08-Mar-2022: As of this date 9 cases of Serious Adverse Event (SAE) anaphylactic reaction reported. The sponsor does not believe that changes to the conduct of this study are warranted at this time. Pamrevlumab protocols include language on anaphylactic reactions in Sections 7.3.8. Hypersensitivity and Anaphylactic Reactions and per Section 4.3 Infusion-associated reactions deemed life-threatening are mandatory reasons for study drug discontinuation. Protocol procedures should be followed for subject monitoring during and after IV infusion of study drug. As a reminder, per recent protocol amendments on the schedule of assessments, in the event of any immunogenic reaction, including suspected drug-related hypersensitivity or anaphylaxis PK, HAHA, HAHA-NA, CTGF and tryptase should be drawn within 24 hours.   [facility] [place] Coordinator note :   This visit for Subject Shane [name] with DOB: 15-Nov-1941 on 04/29/2022 for the  above protocol is Visit/Encounter # Ex Week 129 and is for purpose of research.   The consent for this encounter is under Protocol: Protocol Amendment 6.0 (Approved date: 31OCT2022) IB: version 20 dated [id number] (Approved [id number])  Main ICF: version [id number], revised 16Dec22 Optional Genetic Consent: version [id number], revised 16Dec22 OLE ICF: version [id number], revised [id number]  Subject expressed continued interest and consent in continuing as a study subject. Subject confirmed that there was no change in contact information (e.g. address, telephone, email). Subject thanked for participation in research and contribution to science.  In this visit 04/29/2022 the subject will be re-consented by [facility] and Sub-Investigator named Tammy Parrett . This research coordinator has verified that the above investigator is up to date with her training logs.   The Subject was informed that the PI Dr. Chase Caller continues to have oversight of the subject's visits and course  through relevant discussions, reviews and also specifically of this visit by routing of this note to the [facility].   The patient appeared well today. He received and tolerated the infusion well. Please refer to paper source subject binder for further details regarding the re-consent process and visit assessments.    Signed by  Lytle Michaels Clinical Research Coordinator [facility]  [place], [place] 12:59 PM 04/29/2022

## 2022-04-29 NOTE — Progress Notes (Signed)
@Patient  ID: Shane [name], male    DOB: 11/11/1941, 81 y.o.   MRN: [id number]  Chief Complaint  Patient presents with   Research    Referring provider: Burnard Bunting, MD  HPI: Title: FGCL-3019-091 Open-Label Extension (OLE) is a multi-center, single-arm, open-label extension (OLE) phase where subjects who complete the Week 48 visit of the main study are eligible to participate. The extension to the main study allows for the continued evaluation of the efficacy and safety of 30 mg/kg IV infusions of pamrevlumab administered every 3 weeks for 52 weeks in subjects with Idiopathic Pulmonary Fibrosis.   Primary endpoint is: To provide continued access to pamrevlumab in subjects with IPF who completed the Week 48 visit of the main study.  Protocol #: [id number], Clinical Trials #: [id number] Sponsor: [email or web address]  ([place], [place], [place])  Nature conservation officer Features of Pamrevlumab (FG-3019) the study drug: a recombinant fully human IgG kappa monoclonal antibody that binds to CTGF and is being developed for treatment of diseases in which tissue fibrosis has a major pathogenic role. In particular, pamrevlumab appears to disrupt a CTGF autocrine loop in mesenchymal cells like myofibroblasts that reduces their recruitment of leukocytes like macrophages, mast and dendritic cells via chemokine secretion. This disruption results in collapse of the cellular crosstalk that drives tissue remodeling.  Key Inclusion Criteria Completed the Week 48 visit of the main study.   The investigator considers the subject to be suitable for continued participation in the OLE phase, including ability to comply with study visits every 3 weeks for pamrevlumab infusions.  A signed and dated IRB/IEC-approved informed consent must be obtained before dosing start, and preferably at the Week 48 visit of the main study.  Male subjects with partners of childbearing potential and male subjects of childbearing potential  (including those <1 year postmenopausal) must continue to use highly effective contraception methods until 3 months after the last dose of study drug in the OLE phase.   Key Exclusion Criteria Male subjects who are pregnant or nursing.  Use of any investigational drugs, or participation in a clinical trial with an investigational new drug, other than participation in the main study.  History of allergic or anaphylactic reaction to human, humanized, chimeric or murine monoclonal antibodies, or experienced an allergic or anaphylactic reaction to study drug or to any component of the excipient while participating in the main study.  Subjects who withdrew informed consent while participating in the main study.  The investigator judges that the subject is not suitable for participation, or is unable to fully participate in the OLE phase and complete it for any reason, including inability to comply with study procedures and treatment, or any other relevant medical or psychiatric conditions.   Mechanism of Action Pamrevlumab is a human recombinant IgG monoclonal antibody that binds to connective tissue growth factor (CTGF).  CTGF plays a role by mediating the process of fibrosis. By binding to CTGF, pamrevlumab blocks its biologic activity; thereby preventing cell proliferation, adhesion and migration of growth factors involved in fibrotic changes. It is also being studied in other conditions where fibrotic changes play a role; liver fibrosis, Duchenne muscular dystrophy and certain cancers.   Half-life 58-141 hours; t1/2 increases with increasing doses   Interactions No known drug interactions. All concomitant medications to be reviewed by PI.  Safety Data 1102 subjects have been exposed to pamrevlumab, 520 with IPF The most common treatment emergent adverse events (TEAEs) in all subjects with IPF include: cough, fatigue, dyspnea, upper respiratory tract  infection, bronchitis, nasopharyngitis No  known effect on qtc prolongation, renal or hepatic issues Cleared [facility] [phone] for protocol to continue without changes  Updates: 08-Oct-2021: As of this date 2 cases of Serious Adverse Event (SAE) anaphylactic reaction reported. The sponsor does not believe that changes to the conduct of this study are warranted at this time.  30-Aug-2021: Embryo fetal development study findings in male rabbits treated with pamrevlumab determined potential risks for pregnant male patients that could cause fetal abnormalities. Per sponsor, there are no recommended changes to safety monitoring, study procedures, or study conduct as a result of this finding. Per PI, no safety or data integrity has been compromised.    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 The sponsor does not believe that changes to the conduct of this study are warranted at this time.  08-Mar-2022: As of this date 9 cases of Serious Adverse Event (SAE) anaphylactic reaction reported. The sponsor does not believe that changes to the conduct of this study are warranted at this time. Pamrevlumab protocols include language on anaphylactic reactions in Sections 7.3.8. Hypersensitivity and Anaphylactic Reactions and per Section 4.3 Infusion-associated reactions deemed life-threatening are mandatory reasons for study drug discontinuation. Protocol procedures should be followed for subject monitoring during and after IV infusion of study drug. As a reminder, per recent protocol amendments on the schedule of assessments, in the event of any immunogenic reaction, including suspected drug-related hypersensitivity or anaphylaxis PK, HAHA, HAHA-NA, CTGF and tryptase should be drawn within 24 hours.    04/29/2022 Research visit  This visit for Subject Shane [name] with DOB: 17-Apr-1941 on 04/29/2022 for the above protocol is Visit/Encounter # 04/29/2022   and is for purpose of research  . Subject/LAR expressed  continued interest and consent in continuing as a study subject. Subject thanked for participation in research and contribution to science. The subject was re-consented by [facility] and Sub-Investigator . Denies any questions or concerns. Wants to proceed with study.   Allergies  Allergen Reactions   Codeine Hives and Itching   Ofev [Nintedanib] Diarrhea    SEVERE DIARRHEA   Pirfenidone Diarrhea and Other (See Comments)    Esbriet (Pirfenidone) causes elevated LFTs. 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 10/2019 Echo: EF 55-60%, Gr2 DD. Nl RV.    Neuromuscular disorder ([place])    Neuropathy associated with endocrine disorder ([place])    Nonobstructive CAD (coronary artery disease)    a. 2012 Cath: mod, nonobs dzs; b. 10/2016  MV: EF 60%, no ischemia.   OSA on CPAP 05/05/2017   Unattended Home Sleep Test 7/2/813-AHI 38.6/hour, desaturation to 64%, body weight 261 pounds   PONV (postoperative nausea and vomiting)    Sleep apnea     uses cpap asked to bring mask and tubing     Tobacco History: Social History   Tobacco Use  Smoking Status Never  Smokeless Tobacco Never   Counseling given: Not Answered   Outpatient Medications Prior to Visit  Medication Sig Dispense Refill   acetaminophen (TYLENOL) 325 MG tablet Take 1-2 tablets (325-650 mg total) by mouth every 6 (six) hours as needed for mild pain (pain score 1-3 or temp > 100.5).     albuterol (VENTOLIN HFA) 108 (90 Base) MCG/ACT inhaler Inhale 2 puffs into the lungs every 4 (four) hours as needed for wheezing or shortness of breath. 6.7 g 0   apixaban (ELIQUIS) 5 MG TABS tablet Take 1 tablet (5 mg total) by mouth 2 (two) times daily. 60 tablet 11   Ascorbic Acid (VITAMIN C WITH ROSE HIPS) 500 MG tablet Take 1 tablet (500 mg total) by mouth daily. 30 tablet 0   atorvastatin (LIPITOR) 80 MG tablet Take 1 tablet (80 mg total) by mouth daily. 90 tablet 3   docusate sodium (COLACE) 100 MG capsule Take 1 capsule (100 mg total) by mouth daily. 10 capsule 0   doxycycline (VIBRA-TABS) 100 MG tablet Take 1 tablet (100 mg total) by mouth 2 (two) times daily. 60 tablet 0   escitalopram (LEXAPRO) 10 MG tablet Take 1 tablet (10 mg total) by mouth daily. 30 tablet 0   furosemide (LASIX) 40 MG tablet Take 1 tablet (40 mg total) by mouth daily. 30 tablet 0   gabapentin (NEURONTIN) 300 MG capsule TAKE 1 CAPSULE BY MOUTH THREE TIMES A DAY 180 capsule 3   insulin aspart (NOVOLOG FLEXPEN) 100 UNIT/ML FlexPen Inject 6 Units into the skin 3 (three) times daily with meals. 15 mL 11   insulin glargine (LANTUS) 100 UNIT/ML Solostar Pen Inject 8 Units into the skin at bedtime. 15 mL 11   iron polysaccharides (NIFEREX) 150 MG capsule Take 1 capsule (150 mg total) by mouth daily with lunch. 30 capsule 0   LORazepam (ATIVAN) 1 MG tablet Take 1 tablet (1 mg total) by mouth at bedtime. 30 tablet 0   magnesium oxide (MAG-OX) 400 (240 Mg) MG tablet Take 1 tablet (400 mg total) by mouth daily. 90 tablet 1   metoprolol tartrate (LOPRESSOR)  50 MG tablet Take 0.5 tablets (25 mg total) by mouth daily. 30 tablet 0   Multiple Vitamin (MULTIVITAMIN WITH MINERALS) TABS tablet Take 1 tablet by mouth daily.     Olopatadine HCl (PATADAY OP) Place 1 drop into both eyes daily as needed (allergies).     pantoprazole (PROTONIX) 40 MG tablet Take 1 tablet (40 mg total) by mouth 2 (two) times daily. 60 tablet 0   polyethylene glycol powder (GLYCOLAX/MIRALAX) 17 GM/SCOOP powder Take 17 g by mouth daily as needed for moderate constipation.     Probiotic Product (PROBIOTIC PO) Take 1 capsule by mouth daily.     STUDY - FIBROGEN - pamrevlumab 10 mg/mL IV infusion (open label) (PI-RAMASWAMY) Inject 30 mg/kg into the vein every 21 ( twenty-one) days. For Investigational Use Only. Infused every 21 days. For questions regarding this medication please contact [facility].     traMADol (ULTRAM) 50 MG tablet Take 1 tablet (50 mg total) by mouth every 8 (eight)  hours as needed for severe pain or moderate pain. 30 tablet 0   Zinc Oxide (TRIPLE PASTE) 12.8 % ointment Apply 1 application topically 2 (two) times daily. Apply to affected area 56.7 g 0   zinc sulfate 220 (50 Zn) MG capsule Take 1 capsule (220 mg total) by mouth daily. 30 capsule 0   No facility-administered medications prior to visit.      STUDY - FIBROGEN - pamrevlumab 10 mg/mL IV infusion (open label) (PI-RAMASWAMY)     Date Action Dose Route User   03/18/2022 1002 New Bag/Given 3,060 mg Intravenous Koren Shiver, RN      STUDY - FIBROGEN - pamrevlumab 10 mg/mL IV infusion (open label) (PI-RAMASWAMY)     Date Action Dose Route User   04/05/2022 1016 New Bag/Given 3,060 mg Intravenous Koren Shiver, RN      STUDY - FIBROGEN - pamrevlumab 10 mg/mL IV infusion (open label) (PI-RAMASWAMY)     Date Action Dose Route User   04/29/2022 1032 New Bag/Given 3,060 mg Intravenous Koren Shiver, RN          Latest Ref Rng & Units 09/21/2018   12:08 PM 05/23/2018   10:37 AM 12/25/2017    9:01 AM  10/04/2017    9:35 AM 04/06/2017   10:13 AM 10/14/2016    8:19 AM  PFT Results  FVC-Pre L  2.89  P 2.88   2.94   2.94   3.00    FVC-Predicted Pre %  61  P 61   62   62   63    FVC-Post L      3.00    FVC-Predicted Post %      63    Pre FEV1/FVC % %  91  P 86   92   91   90    Post FEV1/FCV % %      92    FEV1-Pre L  2.62  P 2.47   2.69   2.69   2.70    FEV1-Predicted Pre %  77  P 72   79   78   78    FEV1-Post L      2.75    DLCO uncorrected ml/min/mmHg 14.95   17.13  P 14.56   15.04   16.43   16.82    DLCO UNC% % 42   47  P 40   41   45   46    DLCO corrected ml/min/mmHg 15.26   17.33  P 15.57   15.31   19.35     DLCO COR %Predicted % 43   47  P 42   42   53     DLVA Predicted % 83   91  P 83   79   99   81    TLC L      4.62    TLC % Predicted %      60    RV % Predicted %      60      P Preliminary result    Lab Results  Component Value Date   NITRICOXIDE TEST WILL BE CREDITED 12/19/2016        Assessment & Plan:   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HCC) Cont current regimen   Research study patient Continue study protocol     Rexene Edison, NP 04/29/2022

## 2022-05-09 ENCOUNTER — Encounter: Payer: Self-pay | Admitting: Physical Therapy

## 2022-05-09 ENCOUNTER — Telehealth: Payer: Self-pay | Admitting: Physical Therapy

## 2022-05-09 ENCOUNTER — Other Ambulatory Visit: Payer: Self-pay | Admitting: Orthopedic Surgery

## 2022-05-09 ENCOUNTER — Other Ambulatory Visit: Payer: Self-pay

## 2022-05-09 ENCOUNTER — Ambulatory Visit: Payer: Medicare Other | Admitting: Physical Therapy

## 2022-05-09 DIAGNOSIS — Z89511 Acquired absence of right leg below knee: Secondary | ICD-10-CM

## 2022-05-09 DIAGNOSIS — R2689 Other abnormalities of gait and mobility: Secondary | ICD-10-CM | POA: Diagnosis not present

## 2022-05-09 DIAGNOSIS — R2681 Unsteadiness on feet: Secondary | ICD-10-CM

## 2022-05-09 DIAGNOSIS — Z9181 History of falling: Secondary | ICD-10-CM | POA: Diagnosis not present

## 2022-05-09 DIAGNOSIS — R293 Abnormal posture: Secondary | ICD-10-CM

## 2022-05-09 DIAGNOSIS — Z89439 Acquired absence of unspecified foot: Secondary | ICD-10-CM

## 2022-05-09 DIAGNOSIS — M6281 Muscle weakness (generalized): Secondary | ICD-10-CM | POA: Diagnosis not present

## 2022-05-09 NOTE — Telephone Encounter (Signed)
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  He has been NWB on LLE since that time.  He was released on 05/04/22 for WBAT.  I evaluated him today, 05/09/22 for PT.  He needs a partial foot prosthesis / toe-filler and an AFO.  I spoke to Brooke Pace, [facility] at [facility]. He needs orders for both. They can be on same prescription but need to be listed as 2 separate orders (toe-filler prosthetic insert & AFO). Please FAX orders to [facility] on [address]. Thank you  Shirlean Mylar

## 2022-05-09 NOTE — Telephone Encounter (Signed)
Faxed

## 2022-05-09 NOTE — Therapy (Signed)
OUTPATIENT PHYSICAL THERAPY PROSTHETICS EVALUATION   Patient Name: Shane [name] MRN: [id number] DOB:1941/10/07, 81 y.o., male Today's Date: 05/09/2022  PCP: Burnard Bunting, MD REFERRING PROVIDER: [name], MD   PT End of Session - 05/09/22 1010     Visit Number 1    Number of Visits 26    Date for PT Re-Evaluation 08/04/22    Authorization Type U[employer] Medicare    Authorization Time Period [facility] MEDICARE  $20 COPAY    Progress Note Due on Visit 10    PT Start Time 0928    PT Stop Time 1010    PT Time Calculation (min) 42 min    Equipment Utilized During Treatment Gait belt    Activity Tolerance Patient tolerated treatment well;Patient limited by fatigue             Past Medical History:  Diagnosis Date   Anxiety    AV block, Mobitz 1    Cataract    Chronic kidney disease    d/t DM   CKD (chronic kidney disease), stage III ([place])    Depression    Diabetes mellitus    Vgo disposal insulin bolus  simular to insulin pump   Dyspnea    GERD (gastroesophageal reflux disease)    History of kidney stones    passed   Hyperlipidemia    Hypertension    Idiopathic pulmonary fibrosis ([place]) 11/2016   ILD (interstitial lung disease) ([place])    Moderate aortic stenosis    a. 10/2019 Echo: EF 55-60%, Gr2 DD. Nl RV.    Neuromuscular disorder ([place])    Neuropathy associated with endocrine disorder ([place])    Nonobstructive CAD (coronary artery disease)    a. 2012 Cath: mod, nonobs dzs; b. 10/2016 MV: EF 60%, no ischemia.   OSA on CPAP 05/05/2017   Unattended Home Sleep Test 7/2/813-AHI 38.6/hour, desaturation to 64%, body weight 261 pounds   PONV (postoperative nausea and vomiting)    Sleep apnea     uses cpap asked to bring mask and tubing   Past Surgical History:  Procedure Laterality Date   ABDOMINAL AORTOGRAM W/LOWER EXTREMITY N/A 12/10/2020   Procedure: ABDOMINAL AORTOGRAM W/LOWER EXTREMITY;  Surgeon: Cherre Robins, MD;  Location: [facility];  Service:  Cardiovascular;  Laterality: N/A;   AMPUTATION Right 01/22/2021   Procedure: 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;  Surgeon: [name], MD;  Location: [place];  Service: Orthopedics;  Laterality: Right;   AMPUTATION Right 03/17/2021   Procedure: RIGHT BELOW KNEE AMPUTATION;  Surgeon: [name], MD;  Location: [place];  Service: Orthopedics;  Laterality: Right;   ANKLE FUSION Right 01/22/2021   Procedure: RIGHT FOOT TIBIOCALCANEAL FUSION;  Surgeon: [name], MD;  Location: [place];  Service: Orthopedics;  Laterality: Right;   ANTERIOR FUSION CERVICAL SPINE  2012   CARDIAC CATHETERIZATION  2011   CARDIAC CATHETERIZATION N/A 11/09/2016   Procedure: Right Heart Cath;  Surgeon: Belva Crome, MD;  Location: [facility];  Service: Cardiovascular;  Laterality: N/A;   carpel tunnel     left wrist   CATARACT EXTRACTION     CATARACT EXTRACTION W/ INTRAOCULAR LENS  IMPLANT, BILATERAL  2013   CERVICAL LAMINECTOMY  2012   COLONOSCOPY N/A 01/14/2013   Procedure: COLONOSCOPY;  Surgeon: Irene Shipper, MD;  Location: [facility];  Service: Endoscopy;  Laterality: N/A;   CORONARY ARTERY BYPASS GRAFT  11/04/2020   LIMA-LAD, SVG-OM1, SVG-PDA (Dr Marney Setting  [place]) dc 11/18/2020   EYE SURGERY     I & D EXTREMITY Right 02/19/2021   Procedure: RIGHT ANKLE DEBRIDEMENT AND PLACEMENT ANTIBIOTIC BEADS;  Surgeon: [name], MD;  Location: [place];  Service: Orthopedics;  Laterality: Right;   KNEE SURGERY  1998   left   LEFT HEART CATH AND CORONARY ANGIOGRAPHY N/A 07/10/2020   Procedure: LEFT HEART CATH AND CORONARY ANGIOGRAPHY;  Surgeon: Sherren Mocha, MD;  Location: [facility];  Service: Cardiovascular;  Laterality: N/A;   LUMBAR LAMINECTOMY  2003   LUNG BIOPSY Left 12/26/2016   Procedure: LUNG BIOPSY;  Surgeon: [name], MD;  Location: [place];  Service: Thoracic;  Laterality: Left;   PACEMAKER IMPLANT N/A 03/30/2020   Procedure: PACEMAKER IMPLANT;  Surgeon: Evans Lance, MD;  Location: [facility];  Service: Cardiovascular;  Laterality: N/A;   PERIPHERAL VASCULAR INTERVENTION Right 12/10/2020   Procedure: PERIPHERAL VASCULAR INTERVENTION;  Surgeon: Cherre Robins, MD;  Location: [facility] [facility];  Service: Cardiovascular;  Laterality: Right;  SFA   POSTERIOR FUSION CERVICAL SPINE  2012   PPM GENERATOR REMOVAL N/A 12/14/2020   Procedure: PPM GENERATOR REMOVAL;  Surgeon: Evans Lance, MD;  Location: [place] [facility];  Service: Cardiovascular;  Laterality: N/A;   TEE WITHOUT CARDIOVERSION N/A 12/11/2020   Procedure: TRANSESOPHAGEAL ECHOCARDIOGRAM (TEE);  Surgeon: Geralynn Rile, MD;  Location: [place];  Service: Cardiovascular;  Laterality: N/A;   TRIGGER FINGER RELEASE  2011   4th finger left hand   VIDEO ASSISTED THORACOSCOPY Left 12/26/2016   Procedure: VIDEO ASSISTED THORACOSCOPY;  Surgeon: [name], MD;  Location: [place];  Service: Thoracic;  Laterality: Left;   VIDEO BRONCHOSCOPY N/A 12/26/2016   Procedure: VIDEO BRONCHOSCOPY;  Surgeon: [name], MD;  Location: [facility];  Service: Thoracic;  Laterality: N/A;   Patient Active Problem List   Diagnosis Date Noted   Intermediate stage nonexudative age-related macular degeneration of both eyes 04/25/2022   Research study patient 12/24/2021   Post-operative pain    CKD (chronic kidney disease), stage II    Labile blood glucose    Pulmonary fibrosis ([place])    Hyponatremia    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[place]) 03/26/2021   S/P BKA (below knee amputation) unilateral, right ([place])    Shortness of breath    Acute blood loss anemia    Stage 3 chronic kidney disease ([place])    Atrial fibrillation ([place])    Controlled type 2 diabetes mellitus with hyperglycemia, with long-term current use of insulin (H[facility])    Postoperative pain    Postoperative hemorrhagic shock 03/20/2021   Atherosclerosis of native arteries of extremities with gangrene, right leg ([place])    Hardware complicating wound infection  ([place])    Abscess of bursa of right ankle 02/15/2021   Endocarditis 01/19/2021   Peripheral vascular disease ([place]) 12/29/2020   CHF (congestive heart failure), NYHA class II, chronic, diastolic ([place]) 82/99/3716   History of COVID-19 12/22/2020   SSS (sick sinus syndrome) ([place]) 12/22/2020   Acute bacterial endocarditis    MSSA bacteremia 12/09/2020   Diabetic foot ulcer ([place]) 12/09/2020   Foot drop, right foot    Generalized weakness 12/08/2020   Dehydration with hyponatremia 12/08/2020   Atrial fibrillation, chronic ([place]) 12/08/2020   Elevated troponin level not due myocardial infarction 12/08/2020   Adrenal insufficiency ([place]) 11/14/2020   Orthostatic hypotension 11/14/2020   Physical deconditioning 11/14/2020   Difficulty sleeping 11/07/2020  Postoperative anemia due to acute blood loss 11/07/2020   Right ventricular dysfunction 11/05/2020   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/04/2020   Hearing loss 11/03/2020   Cardiac device in situ 09/09/2020   Coronary atherosclerosis due to lipid rich plaque 09/02/2020   COVID-19 virus infection 07/18/2020   Coronary artery disease involving native heart without angina pectoris 07/10/2020   Chronic kidney disease, stage 3a ([place]) 03/29/2020   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/28/2020   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 ([place]) 03/16/2020   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 ([place]) 03/16/2020   Right epiretinal membrane 03/16/2020   Posterior vitreous detachment of right eye 03/16/2020   Aortic stenosis, moderate 03/12/2020   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   Near syncope 04/27/2018   Hypoglycemia due to insulin 01/06/2018   Essential hypertension 01/06/2018   Diabetes mellitus type 2, with complication, on long term insulin pump ([place]) 01/06/2018   Syncope and collapse 01/05/2018   Encounter for therapeutic drug monitoring  06/29/2017   OSA on CPAP 05/05/2017   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[place]) 01/05/2017   Abnormal chest x-ray 10/12/2016   Benign neoplasm of colon 01/14/2013    ONSET DATE: 05/04/2022 MD visit with release to WB on LLE  REFERRING DIAG: Z89.511 (ICD-10-CM) - Hx of right BKA  THERAPY DIAG:  Unsteadiness on feet  Other abnormalities of gait and mobility  History of falling  Muscle weakness (generalized)  Abnormal posture  Rationale for Evaluation and Treatment Rehabilitation  SUBJECTIVE:   SUBJECTIVE STATEMENT: This 81yo male underwent a right BKA on 03/17/2021 & left TMA 08/13/2021. He received prosthesis end of Aug 2022 but then he underwent TMA 2 weeks later.  He has not been able to weight bear on LLE until 05/04/2022. He still has 2 wounds that is being monitored every week by PCP. Pt accompanied by: significant other  PERTINENT HISTORY: CKD st III, AV block Mobitz, pacemaker, pulmonary fibrosis, CAD, CABG 21, HTN, A-Fib, DM2, PAD, right BKA  PAIN:  Are you having pain? No  He has some back pain ranging 0/10 to 7-8/10 seating a lot then lays down.   PRECAUTIONS: ICD/Pacemaker  WEIGHT BEARING RESTRICTIONS Yes WBAT LLE thru heel  FALLS: Has patient fallen in last 6 months? Yes. Number of falls 2 times transferring no injuries  LIVING ENVIRONMENT: Lives with: lives with their family and lives with their spouse Lives in: House/apartment Home Access: [place]: Two level and Able to live on main level with bedroom and bathroom Stairs: Yes: Internal: 15 steps; can reach both and External: 5 steps; bilateral but cannot reach both Has following equipment at home: Single point cane, Walker - 2 wheeled, Environmental consultant - 4 wheeled, Wheelchair (power), Wheelchair (manual), Electronics engineer, and Ramped entry  PLOF: Independent, Independent with household mobility without device, Independent with community mobility with device, Leisure: repair old cars & woodwork, yard  work  PATIENT GOALS  walk up steps at [employer] (20 steps to enter) and be active around house including yard work.   OBJECTIVE:   COGNITION: Overall cognitive status: Within functional limits for tasks assessed  POSTURE: rounded shoulders, forward head, and flexed trunk   LOWER EXTREMITY ROM:  ROM P:passive  A:active Right eval Left eval  Hip flexion    Hip extension A: -25* standing A: -25* standing  Hip abduction    Hip adduction    Hip  internal rotation    Hip external rotation    Knee flexion    Knee extension    Ankle dorsiflexion NA -17*  Ankle plantarflexion NA   Ankle inversion NA   Ankle eversion NA    (Blank rows = not tested)  LOWER EXTREMITY MMT:  MMT Right eval Left eval  Hip flexion    Hip extension Functional testing 3-/5 Functional testing 3-/5  Hip abduction Functional testing 3-/5 Functional testing 3-/5  Hip adduction    Hip internal rotation    Hip external rotation    Knee flexion Functional testing 3-/5 Functional testing 3-/5  Knee extension Functional testing 3/5 Functional testing 3/5  Ankle dorsiflexion    Ankle plantarflexion    Ankle inversion    Ankle eversion    (Blank rows = not tested)  TRANSFERS: Sit to stand: SBA and requires use of armrests from 20" stable w/c & needs RW for stabilization Stand to sit: SBA and requires use of armrests from 20" stable w/c & needs RW for stabilization  GAIT: Gait pattern: step to pattern, decreased step length- Left, decreased stance time- Right, Right hip hike, antalgic, lateral hip instability, trunk flexed, and wide BOS Distance walked: 25' Assistive device utilized: Environmental consultant - 2 wheeled and TTA prosthesis RLE Level of assistance: Min A Comments: pt reports dizziness with turning in gait  FUNCTIONAL TESTs:  Berg Balance Scale: 9/56 & tasks of Berg with RW support 27/56  [facility] PT Assessment - 05/09/22 0930       Berg Balance Test   Sit to Stand Needs moderate or maximal assist to  stand   task with RW support = 3   Standing Unsupported Unable to stand 30 seconds unassisted   task with RW support = 3   Sitting with Back Unsupported but Feet Supported on Floor or Stool Able to sit safely and securely 2 minutes    Stand to Sit Uses backs of legs against chair to control descent   task with RW support = 3   Transfers Able to transfer safely, definite need of hands    Standing Unsupported with Eyes Closed Needs help to keep from falling   task with RW support = 3   Standing Unsupported with Feet Together Needs help to attain position and unable to hold for 15 seconds   task with RW support = 2   From Standing, Reach Forward with Outstretched Arm Loses balance while trying/requires external support   task with RW support = 1   From Standing Position, Pick up Object from Floor Unable to try/needs assist to keep balance   task with RW support = 2   From Standing Position, Turn to Look Behind Over each Shoulder Needs assist to keep from losing balance and falling   task with RW support = 1   Turn 360 Degrees Needs assistance while turning   task with RW support = 0   Standing Unsupported, Alternately Place Feet on Step/Stool Needs assistance to keep from falling or unable to try   task with RW support = 0   Standing Unsupported, One Foot in [employer] balance while stepping or standing   task with RW support = 1   Standing on One Leg Unable to try or needs assist to prevent fall   task with RW support = 1   Total Score 9    Berg comment: tasks of Berg with RW support 27/56  CURRENT PROSTHETIC WEAR ASSESSMENT: Patient is dependent with: skin check, residual limb care, care of non-amputated limb, prosthetic cleaning, ply sock cleaning, and correct ply sock adjustment Donning prosthesis: Modified independence Doffing prosthesis: Modified independence Prosthetic wear tolerance: >90% of awake hours/day, 7 days/week Prosthetic weight bearing tolerance: 5  minutes Edema: LLE pitting edema with >10 sec capillary refill Residual limb condition: Right BKA limb has no openings, normal temperature & moisture.  Redness patella & patella tendon from friction of liner with knee flexed. Cylinderical shape.  LLE TMA pt's wife showed picture of wound on dorsum of foot that is being managed by PCP.   Prosthetic description: silicon liner with pin lock suspension, total contact socket, dynamic response foot.    TODAY'S TREATMENT:   TREATMENT:  Education details: PATIENT EDUCATED ON FOLLOWING PROSTHETIC CARE: Skin check, Residual limb care, Prosthetic cleaning, and recommendation for left AFO with partial foot prosthesis of toe filler. PT spoke with Brooke Pace, [facility] & telephone encounter to Meridee Score, MD for prescription for left AFO & partial foot prosthesis of toe filler  Person educated: Patient and Spouse Education method: Explanation, Demonstration, Tactile cues, and Verbal cues Education comprehension: verbalized understanding, verbal cues required, tactile cues required, and needs further education   ASSESSMENT:  CLINICAL IMPRESSION: Patient is a 81 y.o. male who was seen today for physical therapy evaluation and treatment for right Transtibial Amputation with prosthesis and left Transmetatarsal Amputation with recent MD authorization for weight bearing. He underwent a right BKA on 03/17/2021 & left TMA 08/13/2021. He received prosthesis end of Aug 2022 but then he underwent TMA 2 weeks later.  He has not been able to weight bear on LLE until 05/04/2022. He still has 2 wounds that is being monitored every week by PCP. Patient is dependent in proper prosthetic care which increases risk of skin issues. Patient is wearing TTA prosthesis most of awake hours but he does not have appropriate AFO or shoe filler for LLE.  He needs an AFO to help with balance due to bilateral involvement. Berg Balance score of 9/56 and task of Berg with RW support 27/56 indicates  high fall risk.  Patient's prosthetic gait has significant deviations including excessive UE weight bearing and needs minA which indicates high fall risk. He had dizziness with turning during eval.  Patient would benefit from skilled PT to improve function & safety with prosthesis.  OBJECTIVE IMPAIRMENTS Abnormal gait, cardiopulmonary status limiting activity, decreased activity tolerance, decreased balance, decreased endurance, decreased knowledge of use of DME, decreased mobility, decreased ROM, decreased strength, dizziness, increased edema, impaired flexibility, postural dysfunction, prosthetic dependency , obesity, and pain.   ACTIVITY LIMITATIONS carrying, lifting, bending, standing, stairs, transfers, and locomotion level  PARTICIPATION LIMITATIONS: driving, community activity, and yard work  [place], Time since onset of injury/illness/exacerbation, and 3+ comorbidities: see PMH  are also affecting patient's functional outcome.   REHAB POTENTIAL: Good  CLINICAL DECISION MAKING: Evolving/moderate complexity  EVALUATION COMPLEXITY: Moderate   GOALS: Goals reviewed with patient? Yes  SHORT TERM GOALS: Target date: 06/08/2022  Patient demonstrates understanding of initial HEP.  Baseline: SEE OBJECTIVE DATA Goal status: INITIAL 2. Patient able to reach 7" and look over both shoulders with RW support with supervision. Baseline: SEE OBJECTIVE DATA Goal status: INITIAL  4. Patient ambulates 46' with RW & prosthesis with supervision. Baseline: SEE OBJECTIVE DATA Goal status: INITIAL   LONG TERM GOALS: Target date: 08/04/2022  Patient demonstrates & verbalized understanding of prosthetic care to enable safe  utilization of prosthesis. Baseline: SEE OBJECTIVE DATA Goal status: INITIAL  Patient tolerates right prosthesis & left AFO / partial foot prosthesis wear >90% of awake hours without skin or limb pain issues. Baseline: SEE OBJECTIVE DATA Goal status:  INITIAL  Berg Balance >/= 20/56 and tasks of Berg with RW support >/= 45/56 to indicate lower fall risk with standing ADLs Baseline: SEE OBJECTIVE DATA Goal status: INITIAL  Patient ambulates >300' with prosthesis & LRAD modified independent Baseline: SEE OBJECTIVE DATA Goal status: INITIAL  Patient negotiates ramps, curbs & stairs with single rail with prosthesis & LRAD modified independent. Baseline: SEE OBJECTIVE DATA Goal status: INITIAL   PLAN: PT FREQUENCY: 2x/week  PT DURATION: 13 weeks (90 days)  PLANNED INTERVENTIONS: Therapeutic exercises, Therapeutic activity, Neuromuscular re-education, Balance training, Gait training, Patient/Family education, Stair training, Vestibular training, Canalith repositioning, Orthotic/Fit training, Prosthetic training, DME instructions, and physical performance testing  PLAN FOR NEXT SESSION: monitor HR & SpO2 with activities, set up HEP at sink, check when appt with CPO   Jamey Reas, PT, DPT 05/09/2022, 5:42 PM

## 2022-05-11 ENCOUNTER — Ambulatory Visit: Payer: Medicare Other | Admitting: Physical Therapy

## 2022-05-11 ENCOUNTER — Encounter: Payer: Self-pay | Admitting: Physical Therapy

## 2022-05-11 DIAGNOSIS — Z9181 History of falling: Secondary | ICD-10-CM

## 2022-05-11 DIAGNOSIS — R2681 Unsteadiness on feet: Secondary | ICD-10-CM | POA: Diagnosis not present

## 2022-05-11 DIAGNOSIS — R2689 Other abnormalities of gait and mobility: Secondary | ICD-10-CM | POA: Diagnosis not present

## 2022-05-11 DIAGNOSIS — R293 Abnormal posture: Secondary | ICD-10-CM

## 2022-05-11 DIAGNOSIS — M6281 Muscle weakness (generalized): Secondary | ICD-10-CM

## 2022-05-11 NOTE — Patient Instructions (Addendum)
Do each exercise 1-2  times per day Do each exercise 5-10 repetitions Hold each exercise for 2 seconds to feel your location  AT [place].  Try to find this position when standing still for activities.   USE TAPE ON FLOOR TO MARK THE MIDLINE POSITION which is even with middle of sink.  You also should try to feel with your limb pressure in socket.  You are trying to feel with limb what you used to feel with the bottom of your foot.  Side to Side Shift: Moving your hips only (not shoulders): move weight onto your left leg, HOLD/FEEL pressure in socket.  Move back to equal weight on each leg, HOLD/FEEL pressure in socket. Move weight onto your right leg, HOLD/FEEL pressure in socket. Move back to equal weight on each leg, HOLD/FEEL pressure in socket. Repeat.  Start with both hands on sink, progress to hand on prosthetic side only, then no hands.  Front to Back Shift: Moving your hips only (not shoulders): move your weight forward onto your toes, HOLD/FEEL pressure in socket. Move your weight back to equal Flat Foot on both legs, HOLD/FEEL  pressure in socket. Move your weight back onto your heels, HOLD/FEEL  pressure in socket. Move your weight back to equal on both legs, HOLD/FEEL  pressure in socket. Repeat.  Start with both hands on sink, progress to hand on prosthetic side only, then no hands.  Moving Cones / Cups: With equal weight on each leg: Hold on with one hand the first time, then progress to no hand supports. Move cups from one side of sink to the other. Place cups ~2" out of your reach, progress to 10" beyond reach.  Place one hand in middle of sink and reach with other hand. Do both arms.  Then hover one hand and move cups with other hand.  Overhead/Upward Reaching: alternated reaching up to top cabinets or ceiling if no cabinets present. Keep equal weight on each leg. Start with one hand support on counter while other  hand reaches and progress to no hand support with reaching.  ace one hand in middle of sink and reach with other hand. Do both arms.  Then hover one hand and move cups with other hand.  5.   Looking Over Shoulders: With equal weight on each leg: alternate turning to look over your shoulders with one hand support on counter as needed.  Start with head motions only to look in front of shoulder, then even with shoulder and progress to looking behind you. To look to side, move head /eyes, then shoulder on side looking pulls back, shift more weight to side looking and pull hip back. Place one hand in middle of sink and let go with other hand so your shoulder can pull back. Switch hands to look other way.   Then hover one hand and look over shoulder. If looking right, use left hand at sink. If looking left, use right hand at sink. 6.  Alternate Tapping Toes or Stepping to lower shelf of cabinet  Move items under cabinet out of your way. Shift your hips/pelvis so weight on stance leg & Tighten muscles in hip.  SLOWLY step other leg so front of foot is in cabinet. Then step back to floor. Repeat with other leg.

## 2022-05-11 NOTE — Therapy (Signed)
OUTPATIENT PHYSICAL THERAPY TREATMENT NOTE   Patient Name: Shane [name] MRN: [id number] DOB:1941-03-27, 81 y.o., male Today's Date: 05/11/2022  PCP: Burnard Bunting, MD REFERRING PROVIDER: [name], MD  END OF SESSION:   PT End of Session - 05/11/22 1436     Visit Number 2    Number of Visits 26    Date for PT Re-Evaluation 08/04/22    Authorization Type UHC Medicare    Authorization Time Period [facility] MEDICARE  $20 COPAY    Progress Note Due on Visit 10    PT Start Time 1433    PT Stop Time 1514    PT Time Calculation (min) 41 min    Equipment Utilized During Treatment Gait belt    Activity Tolerance Patient tolerated treatment well;Patient limited by fatigue             Past Medical History:  Diagnosis Date   Anxiety    AV block, Mobitz 1    Cataract    Chronic kidney disease    d/t DM   CKD (chronic kidney disease), stage III ([place])    Depression    Diabetes mellitus    Vgo disposal insulin bolus  simular to insulin pump   Dyspnea    GERD (gastroesophageal reflux disease)    History of kidney stones    passed   Hyperlipidemia    Hypertension    Idiopathic pulmonary fibrosis ([place]) 11/2016   ILD (interstitial lung disease) ([place])    Moderate aortic stenosis    a. 10/2019 Echo: EF 55-60%, Gr2 DD. Nl RV.    Neuromuscular disorder ([place])    Neuropathy associated with endocrine disorder ([place])    Nonobstructive CAD (coronary artery disease)    a. 2012 Cath: mod, nonobs dzs; b. 10/2016 MV: EF 60%, no ischemia.   OSA on CPAP 05/05/2017   Unattended Home Sleep Test 7/2/813-AHI 38.6/hour, desaturation to 64%, body weight 261 pounds   PONV (postoperative nausea and vomiting)    Sleep apnea     uses cpap asked to bring mask and tubing   Past Surgical History:  Procedure Laterality Date   ABDOMINAL AORTOGRAM W/LOWER EXTREMITY N/A 12/10/2020   Procedure: ABDOMINAL AORTOGRAM W/LOWER EXTREMITY;  Surgeon: Cherre Robins, MD;  Location: [place] [facility];  Service:  Cardiovascular;  Laterality: N/A;   AMPUTATION Right 01/22/2021   Procedure: 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;  Surgeon: [name], MD;  Location: [place];  Service: Orthopedics;  Laterality: Right;   AMPUTATION Right 03/17/2021   Procedure: RIGHT BELOW KNEE AMPUTATION;  Surgeon: [name], MD;  Location: [place];  Service: Orthopedics;  Laterality: Right;   ANKLE FUSION Right 01/22/2021   Procedure: RIGHT FOOT TIBIOCALCANEAL FUSION;  Surgeon: [name], MD;  Location: [place];  Service: Orthopedics;  Laterality: Right;   ANTERIOR FUSION CERVICAL SPINE  2012   CARDIAC CATHETERIZATION  2011   CARDIAC CATHETERIZATION N/A 11/09/2016   Procedure: Right Heart Cath;  Surgeon: Belva Crome, MD;  Location: [place] [facility];  Service: Cardiovascular;  Laterality: N/A;   carpel tunnel     left wrist   CATARACT EXTRACTION     CATARACT EXTRACTION W/ INTRAOCULAR LENS  IMPLANT, BILATERAL  2013   CERVICAL LAMINECTOMY  2012   COLONOSCOPY N/A 01/14/2013   Procedure: COLONOSCOPY;  Surgeon: Irene Shipper, MD;  Location: [facility];  Service: Endoscopy;  Laterality: N/A;   CORONARY ARTERY BYPASS GRAFT  11/04/2020   LIMA-LAD, SVG-OM1, SVG-PDA (  Dr Marney [facility]) dc 11/18/2020   EYE SURGERY     I & D EXTREMITY Right 02/19/2021   Procedure: RIGHT ANKLE DEBRIDEMENT AND PLACEMENT ANTIBIOTIC BEADS;  Surgeon: [name], MD;  Location: [place];  Service: Orthopedics;  Laterality: Right;   KNEE SURGERY  1998   left   LEFT HEART CATH AND CORONARY ANGIOGRAPHY N/A 07/10/2020   Procedure: LEFT HEART CATH AND CORONARY ANGIOGRAPHY;  Surgeon: Sherren Mocha, MD;  Location: [place] [facility];  Service: Cardiovascular;  Laterality: N/A;   LUMBAR LAMINECTOMY  2003   LUNG BIOPSY Left 12/26/2016   Procedure: LUNG BIOPSY;  Surgeon: [name], MD;  Location: [place];  Service: Thoracic;  Laterality: Left;   PACEMAKER IMPLANT N/A 03/30/2020   Procedure: PACEMAKER IMPLANT;  Surgeon: Evans Lance, MD;  Location: [place] [facility];  Service: Cardiovascular;  Laterality: N/A;   PERIPHERAL VASCULAR INTERVENTION Right 12/10/2020   Procedure: PERIPHERAL VASCULAR INTERVENTION;  Surgeon: Cherre Robins, MD;  Location: [place] [facility];  Service: Cardiovascular;  Laterality: Right;  SFA   POSTERIOR FUSION CERVICAL SPINE  2012   PPM GENERATOR REMOVAL N/A 12/14/2020   Procedure: PPM GENERATOR REMOVAL;  Surgeon: Evans Lance, MD;  Location: [place] [facility];  Service: Cardiovascular;  Laterality: N/A;   TEE WITHOUT CARDIOVERSION N/A 12/11/2020   Procedure: TRANSESOPHAGEAL ECHOCARDIOGRAM (TEE);  Surgeon: Geralynn Rile, MD;  Location: [place];  Service: Cardiovascular;  Laterality: N/A;   TRIGGER FINGER RELEASE  2011   4th finger left hand   VIDEO ASSISTED THORACOSCOPY Left 12/26/2016   Procedure: VIDEO ASSISTED THORACOSCOPY;  Surgeon: [name], MD;  Location: [place];  Service: Thoracic;  Laterality: Left;   VIDEO BRONCHOSCOPY N/A 12/26/2016   Procedure: VIDEO BRONCHOSCOPY;  Surgeon: [name], MD;  Location: [place];  Service: Thoracic;  Laterality: N/A;   Patient Active Problem List   Diagnosis Date Noted   Intermediate stage nonexudative age-related macular degeneration of both eyes 04/25/2022   Research study patient 12/24/2021   Post-operative pain    CKD (chronic kidney disease), stage II    Labile blood glucose    Pulmonary fibrosis ([place])    Hyponatremia    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[place]) 03/26/2021   S/P BKA (below knee amputation) unilateral, right ([place])    Shortness of breath    Acute blood loss anemia    Stage 3 chronic kidney disease ([place])    Atrial fibrillation ([place])    Controlled type 2 diabetes mellitus with hyperglycemia, with long-term current use of insulin (H[facility])    Postoperative pain    Postoperative hemorrhagic shock 03/20/2021   Atherosclerosis of native arteries of extremities with gangrene, right leg ([place])    Hardware complicating wound infection  ([place])    Abscess of bursa of right ankle 02/15/2021   Endocarditis 01/19/2021   Peripheral vascular disease ([place]) 12/29/2020   CHF (congestive heart failure), NYHA class II, chronic, diastolic ([place]) 60/45/4098   History of COVID-19 12/22/2020   SSS (sick sinus syndrome) ([place]) 12/22/2020   Acute bacterial endocarditis    MSSA bacteremia 12/09/2020   Diabetic foot ulcer ([place]) 12/09/2020   Foot drop, right foot    Generalized weakness 12/08/2020   Dehydration with hyponatremia 12/08/2020   Atrial fibrillation, chronic ([place]) 12/08/2020   Elevated troponin level not due myocardial infarction 12/08/2020   Adrenal insufficiency ([place]) 11/14/2020   Orthostatic hypotension 11/14/2020   Physical deconditioning 11/14/2020   Difficulty sleeping  11/07/2020   Postoperative anemia due to acute blood loss 11/07/2020   Right ventricular dysfunction 11/05/2020   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/04/2020   Hearing loss 11/03/2020   Cardiac device in situ 09/09/2020   Coronary atherosclerosis due to lipid rich plaque 09/02/2020   COVID-19 virus infection 07/18/2020   Coronary artery disease involving native heart without angina pectoris 07/10/2020   Chronic kidney disease, stage 3a ([place]) 03/29/2020   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/28/2020   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 ([place]) 03/16/2020   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 ([place]) 03/16/2020   Right epiretinal membrane 03/16/2020   Posterior vitreous detachment of right eye 03/16/2020   Aortic stenosis, moderate 03/12/2020   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   Near syncope 04/27/2018   Hypoglycemia due to insulin 01/06/2018   Essential hypertension 01/06/2018   Diabetes mellitus type 2, with complication, on long term insulin pump ([place]) 01/06/2018   Syncope and collapse 01/05/2018   Encounter for therapeutic drug monitoring  06/29/2017   OSA on CPAP 05/05/2017   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[place]) 01/05/2017   Abnormal chest x-ray 10/12/2016   Benign neoplasm of colon 01/14/2013    REFERRING DIAG: Z89.511 (ICD-10-CM) - Hx of right BKA  ONSET DATE: 05/04/2022 MD visit with release to [facility] on LLE  THERAPY DIAG:  Unsteadiness on feet  Other abnormalities of gait and mobility  History of falling  Muscle weakness (generalized)  Abnormal posture  Rationale for Evaluation and Treatment Rehabilitation  PERTINENT HISTORY: CKD st III, AV block Mobitz, pacemaker, pulmonary fibrosis, CAD, CABG 21, HTN, A-Fib, DM2, PAD, right BKA, Left TMA  PRECAUTIONS: ICD/Pacemaker  SUBJECTIVE: He saw Dr. [name] this morning who okayed Vivewear compression socks 15-14mm for LLE and he has appt with Brooke Pace, [facility] on Monday.   PAIN:  Are you having pain? No   OBJECTIVE: (objective measures completed at initial evaluation unless otherwise dated)  OBJECTIVE:    COGNITION: Overall cognitive status: Within functional limits for tasks assessed   POSTURE: rounded shoulders, forward head, and flexed trunk    LOWER EXTREMITY ROM:   ROM P:passive  A:active Right eval Left eval  Hip flexion      Hip extension A: -25* standing A: -25* standing  Hip abduction      Hip adduction      Hip internal rotation      Hip external rotation      Knee flexion      Knee extension      Ankle dorsiflexion NA P: -17*  Ankle plantarflexion NA    Ankle inversion NA    Ankle eversion NA     (Blank rows = not tested)   LOWER EXTREMITY MMT:   MMT Right eval Left eval  Hip flexion      Hip extension Functional testing 3-/5 Functional testing 3-/5  Hip abduction Functional testing 3-/5 Functional testing 3-/5  Hip adduction      Hip internal rotation      Hip external rotation      Knee flexion Functional testing 3-/5 Functional testing 3-/5  Knee extension Functional testing 3/5 Functional testing 3/5  Ankle  dorsiflexion      Ankle plantarflexion      Ankle inversion      Ankle eversion      (Blank rows = not tested)   TRANSFERS: Sit to stand: SBA and  requires use of armrests from 20" stable w/c & needs RW for stabilization Stand to sit: SBA and requires use of armrests from 20" stable w/c & needs RW for stabilization   GAIT: Gait pattern: step to pattern, decreased step length- Left, decreased stance time- Right, Right hip hike, antalgic, lateral hip instability, trunk flexed, and wide BOS Distance walked: 25' Assistive device utilized: Environmental consultant - 2 wheeled and TTA prosthesis RLE Level of assistance: Min A Comments: pt reports dizziness with turning in gait   FUNCTIONAL TESTs:  Berg Balance Scale: 9/56 & tasks of Berg with RW support 27/56   [facility] PT Assessment - 05/09/22 0930                Berg Balance Test    Sit to Stand Needs moderate or maximal assist to stand   task with RW support = 3    Standing Unsupported Unable to stand 30 seconds unassisted   task with RW support = 3    Sitting with Back Unsupported but Feet Supported on Floor or Stool Able to sit safely and securely 2 minutes     Stand to Sit Uses backs of legs against chair to control descent   task with RW support = 3    Transfers Able to transfer safely, definite need of hands     Standing Unsupported with Eyes Closed Needs help to keep from falling   task with RW support = 3    Standing Unsupported with Feet Together Needs help to attain position and unable to hold for 15 seconds   task with RW support = 2    From Standing, Reach Forward with Outstretched Arm Loses balance while trying/requires external support   task with RW support = 1    From Standing Position, Pick up Object from Floor Unable to try/needs assist to keep balance   task with RW support = 2    From Standing Position, Turn to Look Behind Over each Shoulder Needs assist to keep from losing balance and falling   task with RW support = 1    Turn 360 Degrees  Needs assistance while turning   task with RW support = 0    Standing Unsupported, Alternately Place Feet on Step/Stool Needs assistance to keep from falling or unable to try   task with RW support = 0    Standing Unsupported, One Foot in [employer] balance while stepping or standing   task with RW support = 1    Standing on One Leg Unable to try or needs assist to prevent fall   task with RW support = 1    Total Score 9     Berg comment: tasks of Berg with RW support 27/56                     CURRENT PROSTHETIC WEAR ASSESSMENT: Patient is dependent with: skin check, residual limb care, care of non-amputated limb, prosthetic cleaning, ply sock cleaning, and correct ply sock adjustment Donning prosthesis: Modified independence Doffing prosthesis: Modified independence Prosthetic wear tolerance: >90% of awake hours/day, 7 days/week Prosthetic weight bearing tolerance: 5 minutes Edema: LLE pitting edema with >10 sec capillary refill Residual limb condition: Right BKA limb has no openings, normal temperature & moisture.  Redness patella & patella tendon from friction of liner with knee flexed. Cylinderical shape.  LLE TMA pt's wife showed picture of wound on dorsum of foot that is being managed by PCP.   Prosthetic  description: silicon liner with pin lock suspension, total contact socket, dynamic response foot.      TODAY'S TREATMENT:   05/11/2022  Therpeutic Activities: Sitting on 24" bar stool to build back strength & endurance for upright posture above waist. Pt & wife verbalized understanding.    05/11/22 1636  PT Education  Education Details HEP at sink & sitting on bar stool to develop back strength / endurance  Person(s) Educated Patient;Spouse  Methods Explanation;Demonstration;Tactile cues;Verbal cues;Handout  Comprehension Verbalized understanding;Returned demonstration;Verbal cues required;Tactile cues required;Need further instruction   Neuromuscular Reed-  cues on  proprioception RLE using residual limb, balance & standing tolerance.   Do each exercise 1-2  times per day Do each exercise 5-10 repetitions Hold each exercise for 2 seconds to feel your location  AT [place].  Try to find this position when standing still for activities.   USE TAPE ON FLOOR TO MARK THE MIDLINE POSITION which is even with middle of sink.  You also should try to feel with your limb pressure in socket.  You are trying to feel with limb what you used to feel with the bottom of your foot.  Side to Side Shift: Moving your hips only (not shoulders): move weight onto your left leg, HOLD/FEEL pressure in socket.  Move back to equal weight on each leg, HOLD/FEEL pressure in socket. Move weight onto your right leg, HOLD/FEEL pressure in socket. Move back to equal weight on each leg, HOLD/FEEL pressure in socket. Repeat.  Start with both hands on sink, progress to hand on prosthetic side only, then no hands.  Front to Back Shift: Moving your hips only (not shoulders): move your weight forward onto your toes, HOLD/FEEL pressure in socket. Move your weight back to equal Flat Foot on both legs, HOLD/FEEL  pressure in socket. Move your weight back onto your heels, HOLD/FEEL  pressure in socket. Move your weight back to equal on both legs, HOLD/FEEL  pressure in socket. Repeat.  Start with both hands on sink, progress to hand on prosthetic side only, then no hands.  Moving Cones / Cups: With equal weight on each leg: Hold on with one hand the first time, then progress to no hand supports. Move cups from one side of sink to the other. Place cups ~2" out of your reach, progress to 10" beyond reach.  Place one hand in middle of sink and reach with other hand. Do both arms.  Then hover one hand and move cups with other hand.  Overhead/Upward Reaching: alternated reaching up to top cabinets or ceiling if no cabinets present. Keep equal  weight on each leg. Start with one hand support on counter while other hand reaches and progress to no hand support with reaching.  ace one hand in middle of sink and reach with other hand. Do both arms.  Then hover one hand and move cups with other hand.  5.   Looking Over Shoulders: With equal weight on each leg: alternate turning to look over your shoulders with one hand support on counter as needed.  Start with head motions only to look in front of shoulder, then even with shoulder and progress to looking behind you. To look to side, move head /eyes, then shoulder on side looking pulls back, shift more weight to side looking and pull hip back. Place one hand in middle of sink and let go with other hand so your shoulder can pull  back. Switch hands to look other way.   Then hover one hand and look over shoulder. If looking right, use left hand at sink. If looking left, use right hand at sink. 6.  Alternate Tapping Toes or Stepping to lower shelf of cabinet  Move items under cabinet out of your way. Shift your hips/pelvis so weight on stance leg & Tighten muscles in hip.  SLOWLY step other leg so front of foot is in cabinet. Then step back to floor. Repeat with other leg.    05/09/2022: TREATMENT:  Education details: PATIENT EDUCATED ON FOLLOWING PROSTHETIC CARE: Skin check, Residual limb care, Prosthetic cleaning, and recommendation for left AFO with partial foot prosthesis of toe filler. PT spoke with Brooke Pace, [facility] & telephone encounter to Meridee Score, MD for prescription for left AFO & partial foot prosthesis of toe filler   Person educated: Patient and Spouse Education method: Explanation, Demonstration, Tactile cues, and Verbal cues Education comprehension: verbalized understanding, verbal cues required, tactile cues required, and needs further education     ASSESSMENT:   CLINICAL IMPRESSION: Patient and wife appear to understand HEP including using residual limb for proprioception.      OBJECTIVE IMPAIRMENTS Abnormal gait, cardiopulmonary status limiting activity, decreased activity tolerance, decreased balance, decreased endurance, decreased knowledge of use of DME, decreased mobility, decreased ROM, decreased strength, dizziness, increased edema, impaired flexibility, postural dysfunction, prosthetic dependency , obesity, and pain.    ACTIVITY LIMITATIONS carrying, lifting, bending, standing, stairs, transfers, and locomotion level   PARTICIPATION LIMITATIONS: driving, community activity, and yard work   [place], Time since onset of injury/illness/exacerbation, and 3+ comorbidities: see PMH  are also affecting patient's functional outcome.    REHAB POTENTIAL: Good   CLINICAL DECISION MAKING: Evolving/moderate complexity   EVALUATION COMPLEXITY: Moderate     GOALS: Goals reviewed with patient? Yes   SHORT TERM GOALS: Target date: 06/08/2022   Patient demonstrates understanding of initial HEP.  Baseline: SEE OBJECTIVE DATA Goal status: INITIAL 2. Patient able to reach 7" and look over both shoulders with RW support with supervision. Baseline: SEE OBJECTIVE DATA Goal status: INITIAL   3. Patient ambulates 24' with RW & prosthesis with supervision. Baseline: SEE OBJECTIVE DATA Goal status: INITIAL     LONG TERM GOALS: Target date: 08/04/2022   Patient demonstrates & verbalized understanding of prosthetic care to enable safe utilization of prosthesis. Baseline: SEE OBJECTIVE DATA Goal status: INITIAL   Patient tolerates right prosthesis & left AFO / partial foot prosthesis wear >90% of awake hours without skin or limb pain issues. Baseline: SEE OBJECTIVE DATA Goal status: INITIAL   Berg Balance >/= 20/56 and tasks of Berg with RW support >/= 45/56 to indicate lower fall risk with standing ADLs Baseline: SEE OBJECTIVE DATA Goal status: INITIAL   Patient ambulates >300' with prosthesis & LRAD modified independent Baseline: SEE OBJECTIVE  DATA Goal status: INITIAL   Patient negotiates ramps, curbs & stairs with single rail with prosthesis & LRAD modified independent. Baseline: SEE OBJECTIVE DATA Goal status: INITIAL     PLAN: PT FREQUENCY: 2x/week   PT DURATION: 13 weeks (90 days)   PLANNED INTERVENTIONS: Therapeutic exercises, Therapeutic activity, Neuromuscular re-education, Balance training, Gait training, Patient/Family education, Stair training, Vestibular training, Canalith repositioning, Orthotic/Fit training, Prosthetic training, DME instructions, and physical performance testing   PLAN FOR NEXT SESSION:  monitor HR & SpO2 with activities, verbally check on HEP at sink, Nustep, standing balance activities with 1/2" lift under LLE until he gets shoe /  AFO.     Jamey Reas, PT, DPT 05/11/2022, 4:44 PM

## 2022-05-16 ENCOUNTER — Encounter: Payer: Self-pay | Admitting: Physical Therapy

## 2022-05-16 ENCOUNTER — Ambulatory Visit: Payer: Medicare Other | Admitting: Physical Therapy

## 2022-05-16 DIAGNOSIS — R2681 Unsteadiness on feet: Secondary | ICD-10-CM

## 2022-05-16 DIAGNOSIS — Z9181 History of falling: Secondary | ICD-10-CM | POA: Diagnosis not present

## 2022-05-16 DIAGNOSIS — R293 Abnormal posture: Secondary | ICD-10-CM

## 2022-05-16 DIAGNOSIS — M6281 Muscle weakness (generalized): Secondary | ICD-10-CM | POA: Diagnosis not present

## 2022-05-16 DIAGNOSIS — R2689 Other abnormalities of gait and mobility: Secondary | ICD-10-CM | POA: Diagnosis not present

## 2022-05-16 NOTE — Therapy (Signed)
OUTPATIENT PHYSICAL THERAPY TREATMENT NOTE   Patient Name: Trelyn Vanderlinde MRN: [id number] DOB:1941-01-19, 81 y.o., male Today's Date: 05/16/2022  PCP: Burnard Bunting, MD REFERRING PROVIDER: [name], MD  END OF SESSION:   PT End of Session - 05/16/22 0759     Visit Number 3    Number of Visits 26    Date for PT Re-Evaluation 08/04/22    Authorization Type U[employer] Medicare    Authorization Time Period [facility] MEDICARE  $20 COPAY    Progress Note Due on Visit 10    PT Start Time 0759    PT Stop Time 0844    PT Time Calculation (min) 45 min    Equipment Utilized During Treatment Gait belt    Activity Tolerance Patient tolerated treatment well;Patient limited by fatigue             Past Medical History:  Diagnosis Date   Anxiety    AV block, Mobitz 1    Cataract    Chronic kidney disease    d/t DM   CKD (chronic kidney disease), stage III ([place])    Depression    Diabetes mellitus    Vgo disposal insulin bolus  simular to insulin pump   Dyspnea    GERD (gastroesophageal reflux disease)    History of kidney stones    passed   Hyperlipidemia    Hypertension    Idiopathic pulmonary fibrosis ([place]) 11/2016   ILD (interstitial lung disease) ([place])    Moderate aortic stenosis    a. 10/2019 Echo: EF 55-60%, Gr2 DD. Nl RV.    Neuromuscular disorder ([place])    Neuropathy associated with endocrine disorder ([place])    Nonobstructive CAD (coronary artery disease)    a. 2012 Cath: mod, nonobs dzs; b. 10/2016 MV: EF 60%, no ischemia.   OSA on CPAP 05/05/2017   Unattended Home Sleep Test 7/2/813-AHI 38.6/hour, desaturation to 64%, body weight 261 pounds   PONV (postoperative nausea and vomiting)    Sleep apnea     uses cpap asked to bring mask and tubing   Past Surgical History:  Procedure Laterality Date   ABDOMINAL AORTOGRAM W/LOWER EXTREMITY N/A 12/10/2020   Procedure: ABDOMINAL AORTOGRAM W/LOWER EXTREMITY;  Surgeon: Cherre Robins, MD;  Location: [place] [facility];   Service: Cardiovascular;  Laterality: N/A;   AMPUTATION Right 01/22/2021   Procedure: 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;  Surgeon: [name], MD;  Location: [place];  Service: Orthopedics;  Laterality: Right;   AMPUTATION Right 03/17/2021   Procedure: RIGHT BELOW KNEE AMPUTATION;  Surgeon: [name], MD;  Location: [place];  Service: Orthopedics;  Laterality: Right;   ANKLE FUSION Right 01/22/2021   Procedure: RIGHT FOOT TIBIOCALCANEAL FUSION;  Surgeon: [name], MD;  Location: [place];  Service: Orthopedics;  Laterality: Right;   ANTERIOR FUSION CERVICAL SPINE  2012   CARDIAC CATHETERIZATION  2011   CARDIAC CATHETERIZATION N/A 11/09/2016   Procedure: Right Heart Cath;  Surgeon: Belva Crome, MD;  Location: [place] [facility];  Service: Cardiovascular;  Laterality: N/A;   carpel tunnel     left wrist   CATARACT EXTRACTION     CATARACT EXTRACTION W/ INTRAOCULAR LENS  IMPLANT, BILATERAL  2013   CERVICAL LAMINECTOMY  2012   COLONOSCOPY N/A 01/14/2013   Procedure: COLONOSCOPY;  Surgeon: Irene Shipper, MD;  Location: [facility];  Service: Endoscopy;  Laterality: N/A;   CORONARY ARTERY BYPASS GRAFT  11/04/2020   LIMA-LAD, SVG-OM1, SVG-PDA (  Dr Marney [facility]) dc 11/18/2020   EYE SURGERY     I & D EXTREMITY Right 02/19/2021   Procedure: RIGHT ANKLE DEBRIDEMENT AND PLACEMENT ANTIBIOTIC BEADS;  Surgeon: [name], MD;  Location: [place];  Service: Orthopedics;  Laterality: Right;   KNEE SURGERY  1998   left   LEFT HEART CATH AND CORONARY ANGIOGRAPHY N/A 07/10/2020   Procedure: LEFT HEART CATH AND CORONARY ANGIOGRAPHY;  Surgeon: Sherren Mocha, MD;  Location: [place] [facility];  Service: Cardiovascular;  Laterality: N/A;   LUMBAR LAMINECTOMY  2003   LUNG BIOPSY Left 12/26/2016   Procedure: LUNG BIOPSY;  Surgeon: [name], MD;  Location: [place];  Service: Thoracic;  Laterality: Left;   PACEMAKER IMPLANT N/A 03/30/2020   Procedure: PACEMAKER IMPLANT;  Surgeon: Evans Lance, MD;   Location: [place] [facility];  Service: Cardiovascular;  Laterality: N/A;   PERIPHERAL VASCULAR INTERVENTION Right 12/10/2020   Procedure: PERIPHERAL VASCULAR INTERVENTION;  Surgeon: Cherre Robins, MD;  Location: [place] [facility];  Service: Cardiovascular;  Laterality: Right;  SFA   POSTERIOR FUSION CERVICAL SPINE  2012   PPM GENERATOR REMOVAL N/A 12/14/2020   Procedure: PPM GENERATOR REMOVAL;  Surgeon: Evans Lance, MD;  Location: [place] [facility];  Service: Cardiovascular;  Laterality: N/A;   TEE WITHOUT CARDIOVERSION N/A 12/11/2020   Procedure: TRANSESOPHAGEAL ECHOCARDIOGRAM (TEE);  Surgeon: Geralynn Rile, MD;  Location: [place];  Service: Cardiovascular;  Laterality: N/A;   TRIGGER FINGER RELEASE  2011   4th finger left hand   VIDEO ASSISTED THORACOSCOPY Left 12/26/2016   Procedure: VIDEO ASSISTED THORACOSCOPY;  Surgeon: [name], MD;  Location: [place];  Service: Thoracic;  Laterality: Left;   VIDEO BRONCHOSCOPY N/A 12/26/2016   Procedure: VIDEO BRONCHOSCOPY;  Surgeon: [name], MD;  Location: [place];  Service: Thoracic;  Laterality: N/A;   Patient Active Problem List   Diagnosis Date Noted   Intermediate stage nonexudative age-related macular degeneration of both eyes 04/25/2022   Research study patient 12/24/2021   Post-operative pain    CKD (chronic kidney disease), stage II    Labile blood glucose    Pulmonary fibrosis ([place])    Hyponatremia    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[place]) 03/26/2021   S/P BKA (below knee amputation) unilateral, right ([place])    Shortness of breath    Acute blood loss anemia    Stage 3 chronic kidney disease ([place])    Atrial fibrillation ([place])    Controlled type 2 diabetes mellitus with hyperglycemia, with long-term current use of insulin (H[facility])    Postoperative pain    Postoperative hemorrhagic shock 03/20/2021   Atherosclerosis of native arteries of extremities with gangrene, right leg ([place])    Hardware complicating wound  infection ([place])    Abscess of bursa of right ankle 02/15/2021   Endocarditis 01/19/2021   Peripheral vascular disease ([place]) 12/29/2020   CHF (congestive heart failure), NYHA class II, chronic, diastolic ([place]) 70/62/3762   History of COVID-19 12/22/2020   SSS (sick sinus syndrome) ([place]) 12/22/2020   Acute bacterial endocarditis    MSSA bacteremia 12/09/2020   Diabetic foot ulcer ([place]) 12/09/2020   Foot drop, right foot    Generalized weakness 12/08/2020   Dehydration with hyponatremia 12/08/2020   Atrial fibrillation, chronic ([place]) 12/08/2020   Elevated troponin level not due myocardial infarction 12/08/2020   Adrenal insufficiency ([place]) 11/14/2020   Orthostatic hypotension 11/14/2020   Physical deconditioning 11/14/2020   Difficulty sleeping  11/07/2020   Postoperative anemia due to acute blood loss 11/07/2020   Right ventricular dysfunction 11/05/2020   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/04/2020   Hearing loss 11/03/2020   Cardiac device in situ 09/09/2020   Coronary atherosclerosis due to lipid rich plaque 09/02/2020   COVID-19 virus infection 07/18/2020   Coronary artery disease involving native heart without angina pectoris 07/10/2020   Chronic kidney disease, stage 3a ([place]) 03/29/2020   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/28/2020   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 ([place]) 03/16/2020   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 ([place]) 03/16/2020   Right epiretinal membrane 03/16/2020   Posterior vitreous detachment of right eye 03/16/2020   Aortic stenosis, moderate 03/12/2020   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   Near syncope 04/27/2018   Hypoglycemia due to insulin 01/06/2018   Essential hypertension 01/06/2018   Diabetes mellitus type 2, with complication, on long term insulin pump ([place]) 01/06/2018   Syncope and collapse 01/05/2018   Encounter for therapeutic drug monitoring  06/29/2017   OSA on CPAP 05/05/2017   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[place]) 01/05/2017   Abnormal chest x-ray 10/12/2016   Benign neoplasm of colon 01/14/2013    REFERRING DIAG: Z89.511 (ICD-10-CM) - Hx of right BKA  ONSET DATE: 05/04/2022 MD visit with release to WB on LLE  THERAPY DIAG:  Unsteadiness on feet  Other abnormalities of gait and mobility  History of falling  Muscle weakness (generalized)  Abnormal posture  Rationale for Evaluation and Treatment Rehabilitation  PERTINENT HISTORY: CKD st III, AV block Mobitz, pacemaker, pulmonary fibrosis, CAD, CABG 21, HTN, A-Fib, DM2, PAD, right BKA, Left TMA  PRECAUTIONS: ICD/Pacemaker  SUBJECTIVE: He stood to do exercises & some basic activities with lift under left foot. No issues. He is wearing LLE Vivewear compression sock.  He has psorasis that started ~1 month ago.    PAIN:  Are you having pain? No   OBJECTIVE: (objective measures completed at initial evaluation unless otherwise dated)  OBJECTIVE:    COGNITION: Overall cognitive status: Within functional limits for tasks assessed   POSTURE: rounded shoulders, forward head, and flexed trunk    LOWER EXTREMITY ROM:   ROM P:passive  A:active Right eval Left eval  Hip flexion      Hip extension A: -25* standing A: -25* standing  Hip abduction      Hip adduction      Hip internal rotation      Hip external rotation      Knee flexion      Knee extension      Ankle dorsiflexion NA P: -17*  Ankle plantarflexion NA    Ankle inversion NA    Ankle eversion NA     (Blank rows = not tested)   LOWER EXTREMITY MMT:   MMT Right eval Left eval  Hip flexion      Hip extension Functional testing 3-/5 Functional testing 3-/5  Hip abduction Functional testing 3-/5 Functional testing 3-/5  Hip adduction      Hip internal rotation      Hip external rotation      Knee flexion Functional testing 3-/5 Functional testing 3-/5  Knee extension Functional testing  3/5 Functional testing 3/5  Ankle dorsiflexion      Ankle plantarflexion      Ankle inversion      Ankle eversion      (Blank rows = not  tested)   TRANSFERS: Sit to stand: SBA and requires use of armrests from 20" stable w/c & needs RW for stabilization Stand to sit: SBA and requires use of armrests from 20" stable w/c & needs RW for stabilization   GAIT: Gait pattern: step to pattern, decreased step length- Left, decreased stance time- Right, Right hip hike, antalgic, lateral hip instability, trunk flexed, and wide BOS Distance walked: 25' Assistive device utilized: Environmental consultant - 2 wheeled and TTA prosthesis RLE Level of assistance: Min A Comments: pt reports dizziness with turning in gait   FUNCTIONAL TESTs:  Berg Balance Scale: 9/56 & tasks of Berg with RW support 27/56   [facility] PT Assessment - 05/09/22 0930                Berg Balance Test    Sit to Stand Needs moderate or maximal assist to stand   task with RW support = 3    Standing Unsupported Unable to stand 30 seconds unassisted   task with RW support = 3    Sitting with Back Unsupported but Feet Supported on Floor or Stool Able to sit safely and securely 2 minutes     Stand to Sit Uses backs of legs against chair to control descent   task with RW support = 3    Transfers Able to transfer safely, definite need of hands     Standing Unsupported with Eyes Closed Needs help to keep from falling   task with RW support = 3    Standing Unsupported with Feet Together Needs help to attain position and unable to hold for 15 seconds   task with RW support = 2    From Standing, Reach Forward with Outstretched Arm Loses balance while trying/requires external support   task with RW support = 1    From Standing Position, Pick up Object from Floor Unable to try/needs assist to keep balance   task with RW support = 2    From Standing Position, Turn to Look Behind Over each Shoulder Needs assist to keep from losing balance and falling   task with  RW support = 1    Turn 360 Degrees Needs assistance while turning   task with RW support = 0    Standing Unsupported, Alternately Place Feet on Step/Stool Needs assistance to keep from falling or unable to try   task with RW support = 0    Standing Unsupported, One Foot in [place] balance while stepping or standing   task with RW support = 1    Standing on One Leg Unable to try or needs assist to prevent fall   task with RW support = 1    Total Score 9     Berg comment: tasks of Berg with RW support 27/56                     CURRENT PROSTHETIC WEAR ASSESSMENT: Patient is dependent with: skin check, residual limb care, care of non-amputated limb, prosthetic cleaning, ply sock cleaning, and correct ply sock adjustment Donning prosthesis: Modified independence Doffing prosthesis: Modified independence Prosthetic wear tolerance: >90% of awake hours/day, 7 days/week Prosthetic weight bearing tolerance: 5 minutes Edema: LLE pitting edema with >10 sec capillary refill Residual limb condition: Right BKA limb has no openings, normal temperature & moisture.  Redness patella & patella tendon from friction of liner with knee flexed. Cylinderical shape.  LLE TMA pt's wife showed picture of wound on dorsum of foot  that is being managed by PCP.   Prosthetic description: silicon liner with pin lock suspension, total contact socket, dynamic response foot.      TODAY'S TREATMENT:  05/16/2022 PT using 1/2" lift on LLE for activities.  PT limiting gait until he has AFO / partial foot prosthesis on LLE to avoid skin issues Therapeutic Exercise: Nustep seat 11 level 7 with BLEs & BUEs for 8 min HR 124 SpO2 97% Leg Press BLEs 100# 15 reps 2 sets HR90 SpO2 94%  single LE 50# 10 reps ea Hamstring stretch SLR with PT assist 30 sec 2 reps ea LE  Neuromuscular Re-education / Balance Activities Standing feet hip width apart: EO on floor head movements 4 directions, EC on floor head movements 4 directions &  EO on foam head movements 4 directions. MinA to [place] for balance reactions.  Pt noted BLE fatigue at end  05/11/2022  Therpeutic Activities: Sitting on 24" bar stool to build back strength & endurance for upright posture above waist. Pt & wife verbalized understanding.    05/11/22 1636  PT Education  Education Details HEP at sink & sitting on bar stool to develop back strength / endurance  Person(s) Educated Patient;Spouse  Methods Explanation;Demonstration;Tactile cues;Verbal cues;Handout  Comprehension Verbalized understanding;Returned demonstration;Verbal cues required;Tactile cues required;Need further instruction   Neuromuscular Reed-  cues on proprioception RLE using residual limb, balance & standing tolerance.   Do each exercise 1-2  times per day Do each exercise 5-10 repetitions Hold each exercise for 2 seconds to feel your location  AT [place].  Try to find this position when standing still for activities.   USE TAPE ON FLOOR TO MARK THE MIDLINE POSITION which is even with middle of sink.  You also should try to feel with your limb pressure in socket.  You are trying to feel with limb what you used to feel with the bottom of your foot.  Side to Side Shift: Moving your hips only (not shoulders): move weight onto your left leg, HOLD/FEEL pressure in socket.  Move back to equal weight on each leg, HOLD/FEEL pressure in socket. Move weight onto your right leg, HOLD/FEEL pressure in socket. Move back to equal weight on each leg, HOLD/FEEL pressure in socket. Repeat.  Start with both hands on sink, progress to hand on prosthetic side only, then no hands.  Front to Back Shift: Moving your hips only (not shoulders): move your weight forward onto your toes, HOLD/FEEL pressure in socket. Move your weight back to equal Flat Foot on both legs, HOLD/FEEL  pressure in socket. Move your weight back onto your heels, HOLD/FEEL   pressure in socket. Move your weight back to equal on both legs, HOLD/FEEL  pressure in socket. Repeat.  Start with both hands on sink, progress to hand on prosthetic side only, then no hands.  Moving Cones / Cups: With equal weight on each leg: Hold on with one hand the first time, then progress to no hand supports. Move cups from one side of sink to the other. Place cups ~2" out of your reach, progress to 10" beyond reach.  Place one hand in middle of sink and reach with other hand. Do both arms.  Then hover one hand and move cups with other hand.  Overhead/Upward Reaching: alternated reaching up to top cabinets or ceiling if no cabinets present. Keep equal weight on each leg. Start with one hand support on counter while  other hand reaches and progress to no hand support with reaching.  ace one hand in middle of sink and reach with other hand. Do both arms.  Then hover one hand and move cups with other hand.  5.   Looking Over Shoulders: With equal weight on each leg: alternate turning to look over your shoulders with one hand support on counter as needed.  Start with head motions only to look in front of shoulder, then even with shoulder and progress to looking behind you. To look to side, move head /eyes, then shoulder on side looking pulls back, shift more weight to side looking and pull hip back. Place one hand in middle of sink and let go with other hand so your shoulder can pull back. Switch hands to look other way.   Then hover one hand and look over shoulder. If looking right, use left hand at sink. If looking left, use right hand at sink. 6.  Alternate Tapping Toes or Stepping to lower shelf of cabinet  Move items under cabinet out of your way. Shift your hips/pelvis so weight on stance leg & Tighten muscles in hip.  SLOWLY step other leg so front of foot is in cabinet. Then step back to floor. Repeat with other leg.    05/09/2022: TREATMENT:  Education details: PATIENT EDUCATED ON FOLLOWING  PROSTHETIC CARE: Skin check, Residual limb care, Prosthetic cleaning, and recommendation for left AFO with partial foot prosthesis of toe filler. PT spoke with Brooke Pace, [facility] & telephone encounter to Meridee Score, MD for prescription for left AFO & partial foot prosthesis of toe filler   Person educated: Patient and Spouse Education method: Explanation, Demonstration, Tactile cues, and Verbal cues Education comprehension: verbalized understanding, verbal cues required, tactile cues required, and needs further education     ASSESSMENT:   CLINICAL IMPRESSION: PT began exercises & neuromuscular re-ed to work on conditioning & strength for BLEs.  He tolerated activities but his LEs did fatigue.     OBJECTIVE IMPAIRMENTS Abnormal gait, cardiopulmonary status limiting activity, decreased activity tolerance, decreased balance, decreased endurance, decreased knowledge of use of DME, decreased mobility, decreased ROM, decreased strength, dizziness, increased edema, impaired flexibility, postural dysfunction, prosthetic dependency , obesity, and pain.    ACTIVITY LIMITATIONS carrying, lifting, bending, standing, stairs, transfers, and locomotion level   PARTICIPATION LIMITATIONS: driving, community activity, and yard work   [place], Time since onset of injury/illness/exacerbation, and 3+ comorbidities: see PMH  are also affecting patient's functional outcome.    REHAB POTENTIAL: Good   CLINICAL DECISION MAKING: Evolving/moderate complexity   EVALUATION COMPLEXITY: Moderate     GOALS: Goals reviewed with patient? Yes   SHORT TERM GOALS: Target date: 06/08/2022   Patient demonstrates understanding of initial HEP.  Baseline: SEE OBJECTIVE DATA Goal status: INITIAL 2. Patient able to reach 7" and look over both shoulders with RW support with supervision. Baseline: SEE OBJECTIVE DATA Goal status: INITIAL   3. Patient ambulates 52' with RW & prosthesis with  supervision. Baseline: SEE OBJECTIVE DATA Goal status: INITIAL     LONG TERM GOALS: Target date: 08/04/2022   Patient demonstrates & verbalized understanding of prosthetic care to enable safe utilization of prosthesis. Baseline: SEE OBJECTIVE DATA Goal status: INITIAL   Patient tolerates right prosthesis & left AFO / partial foot prosthesis wear >90% of awake hours without skin or limb pain issues. Baseline: SEE OBJECTIVE DATA Goal status: INITIAL   Berg Balance >/= 20/56 and tasks of Berg with RW  support >/= 45/56 to indicate lower fall risk with standing ADLs Baseline: SEE OBJECTIVE DATA Goal status: INITIAL   Patient ambulates >300' with prosthesis & LRAD modified independent Baseline: SEE OBJECTIVE DATA Goal status: INITIAL   Patient negotiates ramps, curbs & stairs with single rail with prosthesis & LRAD modified independent. Baseline: SEE OBJECTIVE DATA Goal status: INITIAL     PLAN: PT FREQUENCY: 2x/week   PT DURATION: 13 weeks (90 days)   PLANNED INTERVENTIONS: Therapeutic exercises, Therapeutic activity, Neuromuscular re-education, Balance training, Gait training, Patient/Family education, Stair training, Vestibular training, Canalith repositioning, Orthotic/Fit training, Prosthetic training, DME instructions, and physical performance testing   PLAN FOR NEXT SESSION:  continue to monitor HR & SpO2 with activities, Nustep, leg press, standing balance activities with 1/2" lift under LLE until he gets shoe / AFO.     Jamey Reas, PT, DPT 05/16/2022, 8:53 AM

## 2022-05-17 ENCOUNTER — Ambulatory Visit (HOSPITAL_COMMUNITY): Payer: Medicare Other | Attending: Cardiology

## 2022-05-17 DIAGNOSIS — I350 Nonrheumatic aortic (valve) stenosis: Secondary | ICD-10-CM | POA: Diagnosis present

## 2022-05-17 LAB — ECHOCARDIOGRAM COMPLETE
AR max vel: 0.65 cm2
AV Area VTI: 0.57 cm2
AV Area mean vel: 0.59 cm2
AV Mean grad: 28 mmHg
AV Peak grad: 42.5 mmHg
Ao pk vel: 3.26 m/s
Area-P 1/2: 3.77 cm2
S' Lateral: 4.05 cm

## 2022-05-17 MED ORDER — PERFLUTREN LIPID MICROSPHERE
1.0000 mL | INTRAVENOUS | Status: AC | PRN
  Administered 2022-05-17: 2 mL via INTRAVENOUS

## 2022-05-19 ENCOUNTER — Ambulatory Visit: Payer: Medicare Other | Admitting: Physical Therapy

## 2022-05-19 ENCOUNTER — Encounter: Payer: Self-pay | Admitting: Physical Therapy

## 2022-05-19 DIAGNOSIS — R2681 Unsteadiness on feet: Secondary | ICD-10-CM | POA: Diagnosis not present

## 2022-05-19 DIAGNOSIS — M6281 Muscle weakness (generalized): Secondary | ICD-10-CM

## 2022-05-19 DIAGNOSIS — R293 Abnormal posture: Secondary | ICD-10-CM

## 2022-05-19 DIAGNOSIS — R2689 Other abnormalities of gait and mobility: Secondary | ICD-10-CM | POA: Diagnosis not present

## 2022-05-19 DIAGNOSIS — Z9181 History of falling: Secondary | ICD-10-CM

## 2022-05-19 NOTE — Therapy (Signed)
OUTPATIENT PHYSICAL THERAPY TREATMENT NOTE   Patient Name: Shane [name] MRN: [id number] DOB:[birth year], 81 y.o., male Today's Date: 05/19/2022  PCP: Burnard Bunting, MD REFERRING PROVIDER: [name], MD  END OF SESSION:   PT End of Session - 05/19/22 0851     Visit Number 4    Number of Visits 26    Date for PT Re-Evaluation 08/04/22    Authorization Type UHC Medicare    Authorization Time Period [facility] MEDICARE  $20 COPAY    Progress Note Due on Visit 10    PT Start Time 0846    PT Stop Time 0930    PT Time Calculation (min) 44 min    Equipment Utilized During Treatment Gait belt    Activity Tolerance Patient tolerated treatment well;Patient limited by fatigue              Past Medical History:  Diagnosis Date   Anxiety    AV block, Mobitz 1    Cataract    Chronic kidney disease    d/t DM   CKD (chronic kidney disease), stage III ([place])    Depression    Diabetes mellitus    Vgo disposal insulin bolus  simular to insulin pump   Dyspnea    GERD (gastroesophageal reflux disease)    History of kidney stones    passed   Hyperlipidemia    Hypertension    Idiopathic pulmonary fibrosis ([place]) 11/2016   ILD (interstitial lung disease) ([place])    Moderate aortic stenosis    a. 10/2019 Echo: EF 55-60%, Gr2 DD. Nl RV.    Neuromuscular disorder ([place])    Neuropathy associated with endocrine disorder ([place])    Nonobstructive CAD (coronary artery disease)    a. 2012 Cath: mod, nonobs dzs; b. 10/2016 MV: EF 60%, no ischemia.   OSA on CPAP 05/05/2017   Unattended Home Sleep Test 7/2/813-AHI 38.6/hour, desaturation to 64%, body weight 261 pounds   PONV (postoperative nausea and vomiting)    Sleep apnea     uses cpap asked to bring mask and tubing   Past Surgical History:  Procedure Laterality Date   ABDOMINAL AORTOGRAM W/LOWER EXTREMITY N/A 12/10/2020   Procedure: ABDOMINAL AORTOGRAM W/LOWER EXTREMITY;  Surgeon: Cherre Robins, MD;  Location: [facility];   Service: Cardiovascular;  Laterality: N/A;   AMPUTATION Right 01/22/2021   Procedure: 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;  Surgeon: [name], MD;  Location: [place];  Service: Orthopedics;  Laterality: Right;   AMPUTATION Right 03/17/2021   Procedure: RIGHT BELOW KNEE AMPUTATION;  Surgeon: [name], MD;  Location: [place];  Service: Orthopedics;  Laterality: Right;   ANKLE FUSION Right 01/22/2021   Procedure: RIGHT FOOT TIBIOCALCANEAL FUSION;  Surgeon: [name], MD;  Location: [place];  Service: Orthopedics;  Laterality: Right;   ANTERIOR FUSION CERVICAL SPINE  2012   CARDIAC CATHETERIZATION  2011   CARDIAC CATHETERIZATION N/A 11/09/2016   Procedure: Right Heart Cath;  Surgeon: Belva Crome, MD;  Location: [place] [facility];  Service: Cardiovascular;  Laterality: N/A;   carpel tunnel     left wrist   CATARACT EXTRACTION     CATARACT EXTRACTION W/ INTRAOCULAR LENS  IMPLANT, BILATERAL  2013   CERVICAL LAMINECTOMY  2012   COLONOSCOPY N/A 01/14/2013   Procedure: COLONOSCOPY;  Surgeon: Irene Shipper, MD;  Location: [facility];  Service: Endoscopy;  Laterality: N/A;   CORONARY ARTERY BYPASS GRAFT  11/04/2020   LIMA-LAD, SVG-OM1,  SVG-PDA (Dr [facility]) dc 11/18/2020   EYE SURGERY     I & D EXTREMITY Right 02/19/2021   Procedure: RIGHT ANKLE DEBRIDEMENT AND PLACEMENT ANTIBIOTIC BEADS;  Surgeon: [name], MD;  Location: [place];  Service: Orthopedics;  Laterality: Right;   KNEE SURGERY  1998   left   LEFT HEART CATH AND CORONARY ANGIOGRAPHY N/A 07/10/2020   Procedure: LEFT HEART CATH AND CORONARY ANGIOGRAPHY;  Surgeon: Sherren Mocha, MD;  Location: [place] [facility];  Service: Cardiovascular;  Laterality: N/A;   LUMBAR LAMINECTOMY  2003   LUNG BIOPSY Left 12/26/2016   Procedure: LUNG BIOPSY;  Surgeon: [name], MD;  Location: [place];  Service: Thoracic;  Laterality: Left;   PACEMAKER IMPLANT N/A 03/30/2020   Procedure: PACEMAKER IMPLANT;  Surgeon: [place] Lance, MD;   Location: [place] [facility];  Service: Cardiovascular;  Laterality: N/A;   PERIPHERAL VASCULAR INTERVENTION Right 12/10/2020   Procedure: PERIPHERAL VASCULAR INTERVENTION;  Surgeon: Cherre Robins, MD;  Location: [place] [facility];  Service: Cardiovascular;  Laterality: Right;  SFA   POSTERIOR FUSION CERVICAL SPINE  2012   PPM GENERATOR REMOVAL N/A 12/14/2020   Procedure: PPM GENERATOR REMOVAL;  Surgeon: [place] Lance, MD;  Location: [place] [facility];  Service: Cardiovascular;  Laterality: N/A;   TEE WITHOUT CARDIOVERSION N/A 12/11/2020   Procedure: TRANSESOPHAGEAL ECHOCARDIOGRAM (TEE);  Surgeon: Geralynn Rile, MD;  Location: [place];  Service: Cardiovascular;  Laterality: N/A;   TRIGGER FINGER RELEASE  2011   4th finger left hand   VIDEO ASSISTED THORACOSCOPY Left 12/26/2016   Procedure: VIDEO ASSISTED THORACOSCOPY;  Surgeon: [name], MD;  Location: [place];  Service: Thoracic;  Laterality: Left;   VIDEO BRONCHOSCOPY N/A 12/26/2016   Procedure: VIDEO BRONCHOSCOPY;  Surgeon: [name], MD;  Location: [place];  Service: Thoracic;  Laterality: N/A;   Patient Active Problem List   Diagnosis Date Noted   Intermediate stage nonexudative age-related macular degeneration of both eyes 04/25/2022   Research study patient 12/24/2021   Post-operative pain    CKD (chronic kidney disease), stage II    Labile blood glucose    Pulmonary fibrosis ([place])    Hyponatremia    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[place]) 03/26/2021   S/P BKA (below knee amputation) unilateral, right ([place])    Shortness of breath    Acute blood loss anemia    Stage 3 chronic kidney disease ([place])    Atrial fibrillation ([place])    Controlled type 2 diabetes mellitus with hyperglycemia, with long-term current use of insulin (H[facility])    Postoperative pain    Postoperative hemorrhagic shock 03/20/2021   Atherosclerosis of native arteries of extremities with gangrene, right leg ([place])    Hardware complicating wound  infection ([place])    Abscess of bursa of right ankle 02/15/2021   Endocarditis 01/19/2021   Peripheral vascular disease ([place]) 12/29/2020   CHF (congestive heart failure), NYHA class II, chronic, diastolic ([place]) 30/08/2329   History of COVID-19 12/22/2020   SSS (sick sinus syndrome) ([place]) 12/22/2020   Acute bacterial endocarditis    MSSA bacteremia 12/09/2020   Diabetic foot ulcer ([place]) 12/09/2020   Foot drop, right foot    Generalized weakness 12/08/2020   Dehydration with hyponatremia 12/08/2020   Atrial fibrillation, chronic ([place]) 12/08/2020   Elevated troponin level not due myocardial infarction 12/08/2020   Adrenal insufficiency ([place]) 11/14/2020   Orthostatic hypotension 11/14/2020   Physical deconditioning 11/14/2020   Difficulty  sleeping 11/07/2020   Postoperative anemia due to acute blood loss 11/07/2020   Right ventricular dysfunction 11/05/2020   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/04/2020   Hearing loss 11/03/2020   Cardiac device in situ 09/09/2020   Coronary atherosclerosis due to lipid rich plaque 09/02/2020   COVID-19 virus infection 07/18/2020   Coronary artery disease involving native heart without angina pectoris 07/10/2020   Chronic kidney disease, stage 3a ([place]) 03/29/2020   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/28/2020   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 ([place]) 03/16/2020   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 ([place]) 03/16/2020   Right epiretinal membrane 03/16/2020   Posterior vitreous detachment of right eye 03/16/2020   Aortic stenosis, moderate 03/12/2020   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   Near syncope 04/27/2018   Hypoglycemia due to insulin 01/06/2018   Essential hypertension 01/06/2018   Diabetes mellitus type 2, with complication, on long term insulin pump ([place]) 01/06/2018   Syncope and collapse 01/05/2018   Encounter for therapeutic drug monitoring  06/29/2017   OSA on CPAP 05/05/2017   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[place]) 01/05/2017   Abnormal chest x-ray 10/12/2016   Benign neoplasm of colon 01/14/2013    REFERRING DIAG: Z89.511 (ICD-10-CM) - Hx of right BKA  ONSET DATE: 05/04/2022 MD visit with release to [facility] on LLE  THERAPY DIAG:  Unsteadiness on feet  Other abnormalities of gait and mobility  History of falling  Muscle weakness (generalized)  Abnormal posture  Rationale for Evaluation and Treatment Rehabilitation  PERTINENT HISTORY: CKD st III, AV block Mobitz, pacemaker, pulmonary fibrosis, CAD, CABG 21, HTN, A-Fib, DM2, PAD, right BKA, Left TMA  PRECAUTIONS: ICD/Pacemaker  SUBJECTIVE: He went to PCP yesterday and larger wound on left dorsum of foot has healed significantly with use of Vivewear compression sock.  He is seeing prosthetist on Monday, 6/19 regarding AFO & shoe filler.   PAIN:   Are you having pain? No   OBJECTIVE: (objective measures completed at initial evaluation unless otherwise dated)  OBJECTIVE:    COGNITION: Overall cognitive status: Within functional limits for tasks assessed   POSTURE: rounded shoulders, forward head, and flexed trunk    LOWER EXTREMITY ROM:   ROM P:passive  A:active Right eval Left eval  Hip flexion      Hip extension A: -25* standing A: -25* standing  Hip abduction      Hip adduction      Hip internal rotation      Hip external rotation      Knee flexion      Knee extension      Ankle dorsiflexion NA P: -17*  Ankle plantarflexion NA    Ankle inversion NA    Ankle eversion NA     (Blank rows = not tested)   LOWER EXTREMITY MMT:   MMT Right eval Left eval  Hip flexion      Hip extension Functional testing 3-/5 Functional testing 3-/5  Hip abduction Functional testing 3-/5 Functional testing 3-/5  Hip adduction      Hip internal rotation      Hip external rotation      Knee flexion Functional testing 3-/5 Functional testing 3-/5  Knee  extension Functional testing 3/5 Functional testing 3/5  Ankle dorsiflexion      Ankle plantarflexion      Ankle inversion      Ankle eversion      (  Blank rows = not tested)   TRANSFERS: Sit to stand: SBA and requires use of armrests from 20" stable w/c & needs RW for stabilization Stand to sit: SBA and requires use of armrests from 20" stable w/c & needs RW for stabilization   GAIT: Gait pattern: step to pattern, decreased step length- Left, decreased stance time- Right, Right hip hike, antalgic, lateral hip instability, trunk flexed, and wide BOS Distance walked: 25' Assistive device utilized: Environmental consultant - 2 wheeled and TTA prosthesis RLE Level of assistance: Min A Comments: pt reports dizziness with turning in gait   FUNCTIONAL TESTs:  Berg Balance Scale: 9/56 & tasks of Berg with RW support 27/56   [facility] PT Assessment - 05/09/22 0930                Berg Balance Test    Sit to Stand Needs moderate or maximal assist to stand   task with RW support = 3    Standing Unsupported Unable to stand 30 seconds unassisted   task with RW support = 3    Sitting with Back Unsupported but Feet Supported on Floor or Stool Able to sit safely and securely 2 minutes     Stand to Sit Uses backs of legs against chair to control descent   task with RW support = 3    Transfers Able to transfer safely, definite need of hands     Standing Unsupported with Eyes Closed Needs help to keep from falling   task with RW support = 3    Standing Unsupported with Feet Together Needs help to attain position and unable to hold for 15 seconds   task with RW support = 2    From Standing, Reach Forward with Outstretched Arm Loses balance while trying/requires external support   task with RW support = 1    From Standing Position, Pick up Object from Floor Unable to try/needs assist to keep balance   task with RW support = 2    From Standing Position, Turn to Look Behind Over each Shoulder Needs assist to keep from losing  balance and falling   task with RW support = 1    Turn 360 Degrees Needs assistance while turning   task with RW support = 0    Standing Unsupported, Alternately Place Feet on Step/Stool Needs assistance to keep from falling or unable to try   task with RW support = 0    Standing Unsupported, One Foot in [employer] balance while stepping or standing   task with RW support = 1    Standing on One Leg Unable to try or needs assist to prevent fall   task with RW support = 1    Total Score 9     Berg comment: tasks of Berg with RW support 27/56                     CURRENT PROSTHETIC WEAR ASSESSMENT: Patient is dependent with: skin check, residual limb care, care of non-amputated limb, prosthetic cleaning, ply sock cleaning, and correct ply sock adjustment Donning prosthesis: Modified independence Doffing prosthesis: Modified independence Prosthetic wear tolerance: >90% of awake hours/day, 7 days/week Prosthetic weight bearing tolerance: 5 minutes Edema: LLE pitting edema with >10 sec capillary refill Residual limb condition: Right BKA limb has no openings, normal temperature & moisture.  Redness patella & patella tendon from friction of liner with knee flexed. Cylinderical shape.  LLE TMA pt's wife showed picture of wound  on dorsum of foot that is being managed by PCP.   Prosthetic description: silicon liner with pin lock suspension, total contact socket, dynamic response foot.      TODAY'S TREATMENT:  05/19/2022 PT using 1/2" lift on LLE for activities.  PT limiting gait until he has AFO / partial foot prosthesis on LLE to avoid skin issues Therapeutic Exercise: Nustep seat 11 level 7 with BLEs & BUEs for 8 min HR 127 SpO2 93% no SOB noted Leg Press BLEs 100# 15 reps 2 sets HR87 SpO2 98%  single LE 50# 15 reps ea - 1st set back 45* & 2nd set back flat Hamstring stretch SLR with strap with PT assist BLEs 30 sec 2 reps ea LE Hip flexor stretch BLEs 1st rep hooklying & 2nd rep knee to chest  supine dropping LE over edge 30 sec hold 2 reps ea LE Verbal cues on pursed lip breathing with target exhale twice as long as inhale with activities.   Therapeutic Activities: PT demo & verbal cues on supine <> sit via sidelying to protect back and decrease dependence on need to pull on things. Pt verbalized understanding.   05/16/2022 PT using 1/2" lift on LLE for activities.  PT limiting gait until he has AFO / partial foot prosthesis on LLE to avoid skin issues Therapeutic Exercise: Nustep seat 11 level 7 with BLEs & BUEs for 8 min HR 124 SpO2 97% Leg Press BLEs 100# 15 reps 2 sets HR90 SpO2 94%  single LE 50# 10 reps ea Hamstring stretch SLR with PT assist 30 sec 2 reps ea LE  Neuromuscular Re-education / Balance Activities Standing feet hip width apart: EO on floor head movements 4 directions, EC on floor head movements 4 directions & EO on foam head movements 4 directions. MinA to [place] for balance reactions.  Pt noted BLE fatigue at end  05/11/2022  Therpeutic Activities: Sitting on 24" bar stool to build back strength & endurance for upright posture above waist. Pt & wife verbalized understanding.    05/11/22 1636  PT Education  Education Details HEP at sink & sitting on bar stool to develop back strength / endurance  Person(s) Educated Patient;Spouse  Methods Explanation;Demonstration;Tactile cues;Verbal cues;Handout  Comprehension Verbalized understanding;Returned demonstration;Verbal cues required;Tactile cues required;Need further instruction   Neuromuscular Reed-  cues on proprioception RLE using residual limb, balance & standing tolerance.   Do each exercise 1-2  times per day Do each exercise 5-10 repetitions Hold each exercise for 2 seconds to feel your location  AT [place].  Try to find this position when standing still for activities.   USE TAPE ON FLOOR TO MARK THE MIDLINE POSITION which is even  with middle of sink.  You also should try to feel with your limb pressure in socket.  You are trying to feel with limb what you used to feel with the bottom of your foot.  Side to Side Shift: Moving your hips only (not shoulders): move weight onto your left leg, HOLD/FEEL pressure in socket.  Move back to equal weight on each leg, HOLD/FEEL pressure in socket. Move weight onto your right leg, HOLD/FEEL pressure in socket. Move back to equal weight on each leg, HOLD/FEEL pressure in socket. Repeat.  Start with both hands on sink, progress to hand on prosthetic side only, then no hands.  Front to Back Shift: Moving your hips only (not shoulders): move your weight forward onto your  toes, HOLD/FEEL pressure in socket. Move your weight back to equal Flat Foot on both legs, HOLD/FEEL  pressure in socket. Move your weight back onto your heels, HOLD/FEEL  pressure in socket. Move your weight back to equal on both legs, HOLD/FEEL  pressure in socket. Repeat.  Start with both hands on sink, progress to hand on prosthetic side only, then no hands.  Moving Cones / Cups: With equal weight on each leg: Hold on with one hand the first time, then progress to no hand supports. Move cups from one side of sink to the other. Place cups ~2" out of your reach, progress to 10" beyond reach.  Place one hand in middle of sink and reach with other hand. Do both arms.  Then hover one hand and move cups with other hand.  Overhead/Upward Reaching: alternated reaching up to top cabinets or ceiling if no cabinets present. Keep equal weight on each leg. Start with one hand support on counter while other hand reaches and progress to no hand support with reaching.  ace one hand in middle of sink and reach with other hand. Do both arms.  Then hover one hand and move cups with other hand.  5.   Looking Over Shoulders: With equal weight on each leg: alternate turning to look over your shoulders with one hand support on counter as needed.  Start  with head motions only to look in front of shoulder, then even with shoulder and progress to looking behind you. To look to side, move head /eyes, then shoulder on side looking pulls back, shift more weight to side looking and pull hip back. Place one hand in middle of sink and let go with other hand so your shoulder can pull back. Switch hands to look other way.   Then hover one hand and look over shoulder. If looking right, use left hand at sink. If looking left, use right hand at sink. 6.  Alternate Tapping Toes or Stepping to lower shelf of cabinet  Move items under cabinet out of your way. Shift your hips/pelvis so weight on stance leg & Tighten muscles in hip.  SLOWLY step other leg so front of foot is in cabinet. Then step back to floor. Repeat with other leg.    05/09/2022: TREATMENT:  Education details: PATIENT EDUCATED ON FOLLOWING PROSTHETIC CARE: Skin check, Residual limb care, Prosthetic cleaning, and recommendation for left AFO with partial foot prosthesis of toe filler. PT spoke with Brooke Pace, [facility] & telephone encounter to Meridee Score, MD for prescription for left AFO & partial foot prosthesis of toe filler   Person educated: Patient and Spouse Education method: Explanation, Demonstration, Tactile cues, and Verbal cues Education comprehension: verbalized understanding, verbal cues required, tactile cues required, and needs further education     ASSESSMENT:   CLINICAL IMPRESSION: Patient is responding well to exercises to address flexibility, strength & endurance.  PT continues to limit standing until he has AFO & shoe filler on LLE to prevent issues with leg length difference & abnormal pressures on transmetatarsal amputation.     OBJECTIVE IMPAIRMENTS Abnormal gait, cardiopulmonary status limiting activity, decreased activity tolerance, decreased balance, decreased endurance, decreased knowledge of use of DME, decreased mobility, decreased ROM, decreased strength, dizziness,  increased edema, impaired flexibility, postural dysfunction, prosthetic dependency , obesity, and pain.    ACTIVITY LIMITATIONS carrying, lifting, bending, standing, stairs, transfers, and locomotion level   PARTICIPATION LIMITATIONS: driving, community activity, and yard work   [place], Time since  onset of injury/illness/exacerbation, and 3+ comorbidities: see PMH  are also affecting patient's functional outcome.    REHAB POTENTIAL: Good   CLINICAL DECISION MAKING: Evolving/moderate complexity   EVALUATION COMPLEXITY: Moderate     GOALS: Goals reviewed with patient? Yes   SHORT TERM GOALS: Target date: 06/08/2022   Patient demonstrates understanding of initial HEP.  Baseline: SEE OBJECTIVE DATA Goal status: INITIAL 2. Patient able to reach 7" and look over both shoulders with RW support with supervision. Baseline: SEE OBJECTIVE DATA Goal status: INITIAL   3. Patient ambulates 32' with RW & prosthesis with supervision. Baseline: SEE OBJECTIVE DATA Goal status: INITIAL     LONG TERM GOALS: Target date: 08/04/2022   Patient demonstrates & verbalized understanding of prosthetic care to enable safe utilization of prosthesis. Baseline: SEE OBJECTIVE DATA Goal status: INITIAL   Patient tolerates right prosthesis & left AFO / partial foot prosthesis wear >90% of awake hours without skin or limb pain issues. Baseline: SEE OBJECTIVE DATA Goal status: INITIAL   Berg Balance >/= 20/56 and tasks of Berg with RW support >/= 45/56 to indicate lower fall risk with standing ADLs Baseline: SEE OBJECTIVE DATA Goal status: INITIAL   Patient ambulates >300' with prosthesis & LRAD modified independent Baseline: SEE OBJECTIVE DATA Goal status: INITIAL   Patient negotiates ramps, curbs & stairs with single rail with prosthesis & LRAD modified independent. Baseline: SEE OBJECTIVE DATA Goal status: INITIAL     PLAN: PT FREQUENCY: 2x/week   PT DURATION: 13 weeks (90  days)   PLANNED INTERVENTIONS: Therapeutic exercises, Therapeutic activity, Neuromuscular re-education, Balance training, Gait training, Patient/Family education, Stair training, Vestibular training, Canalith repositioning, Orthotic/Fit training, Prosthetic training, DME instructions, and physical performance testing   PLAN FOR NEXT SESSION:  continue to monitor HR & SpO2 with activities, instruct in HEP including flexibility, Nustep, leg press, standing balance activities with 1/2" lift under LLE until he gets shoe / AFO. Check when [facility] anticipates AFO delivery.     Jamey Reas, PT, DPT 05/19/2022, 1:35 PM

## 2022-05-20 ENCOUNTER — Ambulatory Visit (INDEPENDENT_AMBULATORY_CARE_PROVIDER_SITE_OTHER): Payer: Medicare Other

## 2022-05-20 ENCOUNTER — Encounter: Payer: Medicare Other | Admitting: *Deleted

## 2022-05-20 VITALS — BP 114/68 | HR 72 | Temp 98.2°F | Resp 18

## 2022-05-20 DIAGNOSIS — Z006 Encounter for examination for normal comparison and control in clinical research program: Secondary | ICD-10-CM

## 2022-05-20 DIAGNOSIS — J84112 Idiopathic pulmonary fibrosis: Secondary | ICD-10-CM

## 2022-05-20 MED ORDER — STUDY - FIBROGEN - PAMREVLUMAB 10 MG/ML IV INFUSION (OPEN LABEL) (PI-RAMASWAMY)
30.0000 mg/kg | Freq: Once | INTRAVENOUS | Status: AC
  Administered 2022-05-20: 3060 mg via INTRAVENOUS
  Filled 2022-05-20: qty 306

## 2022-05-20 NOTE — Progress Notes (Signed)
Diagnosis: Idiopathic Pulmonary Fibrosis (IPF)  Provider:  Marshell Garfinkel, MD  Procedure: Infusion  IV Type: Peripheral, IV Location: L Antecubital  Fibrogen (Pamrevlumab), Dose: 3060mg   Infusion Start Time: 1007  Infusion Stop Time: 1048 am  Post Infusion IV Care: Observation period completed and Peripheral IV Discontinued  Discharge: Condition: Good, Destination: Home . AVS provided to patient.   Performed by:  Koren Shiver, RN

## 2022-05-20 NOTE — Research (Signed)
Title: [id number] Open-Label Extension (OLE) is a multi-center, single-arm, open-label extension (OLE) phase where subjects who complete the Week 48 visit of the main study are eligible to participate. The extension to the main study allows for the continued evaluation of the efficacy and safety of 30 mg/kg IV infusions of pamrevlumab administered every 3 weeks for 52 weeks in subjects with Idiopathic Pulmonary Fibrosis.   Primary endpoint is: To provide continued access to pamrevlumab in subjects with IPF who completed the Week 48 visit of the main study.  Protocol #: [id number], Clinical Trials #: [id number] Sponsor: [email or web address]  ([place], [place], [place])  [facility] @ [employer] Coordinator note :   This visit for Subject Shane [name] with DOB: [birth year] on 05/20/2022 for the above protocol is Visit/Encounter # Ex Week 132 and is for purpose of research.   The consent for this encounter is under Protocol: Protocol Amendment 6.0 (Approved date: 31OCT2022) IB: version 20 dated [id number] (Approved [id number])  Main ICF: version [id number], revised 16Dec22 Optional Genetic Consent: version [id number] OLE ICF: version 16May23  Subject expressed continued interest and consent in continuing as a study subject. Subject confirmed that there was no change in contact information (e.g. address, telephone, email). Subject thanked for participation in research and contribution to science.    The Subject was informed that the PI Dr. Brand Males continues to have oversight of the subject's visits and course  through relevant discussions, reviews and also specifically of this visit by routing of this note to the [place].  Prior to the start of the visit, the CRC reiterated that the revised ICF version 19-Apr-2022 for OLE section 8.0 incorrectly states participants in OLE will be compensated. CRC clarified that there is no compensation to participants during the OLE phase. The  subject acknowledged this and verbalized continued interest in participation.  The patient appeared well today. He started physical therapy on 11-May-2022 for his gait unsteadiness. 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 He received and tolerated the infusion well today. Please refer to paper source subject binder for further details.     Signed by  Lytle Michaels  Clinical Research Coordinator  [facility]  [place], [place] 11:35 AM 05/20/2022

## 2022-05-25 ENCOUNTER — Encounter: Payer: Medicare Other | Admitting: Physical Therapy

## 2022-05-26 ENCOUNTER — Ambulatory Visit (INDEPENDENT_AMBULATORY_CARE_PROVIDER_SITE_OTHER): Payer: Medicare Other | Admitting: Physical Therapy

## 2022-05-26 ENCOUNTER — Encounter: Payer: Self-pay | Admitting: Physical Therapy

## 2022-05-26 DIAGNOSIS — M6281 Muscle weakness (generalized): Secondary | ICD-10-CM

## 2022-05-26 DIAGNOSIS — Z9181 History of falling: Secondary | ICD-10-CM | POA: Diagnosis not present

## 2022-05-26 DIAGNOSIS — R2689 Other abnormalities of gait and mobility: Secondary | ICD-10-CM

## 2022-05-26 DIAGNOSIS — R2681 Unsteadiness on feet: Secondary | ICD-10-CM

## 2022-05-26 DIAGNOSIS — R293 Abnormal posture: Secondary | ICD-10-CM

## 2022-05-26 NOTE — Therapy (Signed)
OUTPATIENT PHYSICAL THERAPY TREATMENT NOTE   Patient Name: Shane [name] MRN: [id number] DOB:[birth year], 81 y.o., male Today's Date: 05/26/2022  PCP: Burnard Bunting, MD REFERRING PROVIDER: [name], MD  END OF SESSION:   PT End of Session - 05/26/22 0851     Visit Number 5    Number of Visits 26    Date for PT Re-Evaluation 08/04/22    Authorization Type UHC Medicare    Authorization Time Period [facility] MEDICARE  $20 COPAY    Progress Note Due on Visit 10    PT Start Time 0847    PT Stop Time 0930    PT Time Calculation (min) 43 min    Equipment Utilized During Treatment Gait belt    Activity Tolerance Patient tolerated treatment well;Patient limited by fatigue               Past Medical History:  Diagnosis Date   Anxiety    AV block, Mobitz 1    Cataract    Chronic kidney disease    d/t DM   CKD (chronic kidney disease), stage III ([place])    Depression    Diabetes mellitus    Vgo disposal insulin bolus  simular to insulin pump   Dyspnea    GERD (gastroesophageal reflux disease)    History of kidney stones    passed   Hyperlipidemia    Hypertension    Idiopathic pulmonary fibrosis ([place]) 11/2016   ILD (interstitial lung disease) ([place])    Moderate aortic stenosis    a. 10/2019 Echo: EF 55-60%, Gr2 DD. Nl RV.    Neuromuscular disorder ([place])    Neuropathy associated with endocrine disorder ([place])    Nonobstructive CAD (coronary artery disease)    a. 2012 Cath: mod, nonobs dzs; b. 10/2016 MV: EF 60%, no ischemia.   OSA on CPAP 05/05/2017   Unattended Home Sleep Test 7/2/813-AHI 38.6/hour, desaturation to 64%, body weight 261 pounds   PONV (postoperative nausea and vomiting)    Sleep apnea     uses cpap asked to bring mask and tubing   Past Surgical History:  Procedure Laterality Date   ABDOMINAL AORTOGRAM W/LOWER EXTREMITY N/A 12/10/2020   Procedure: ABDOMINAL AORTOGRAM W/LOWER EXTREMITY;  Surgeon: Cherre Robins, MD;  Location: [place] [facility];   Service: Cardiovascular;  Laterality: N/A;   AMPUTATION Right 01/22/2021   Procedure: 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;  Surgeon: [name], MD;  Location: [place];  Service: Orthopedics;  Laterality: Right;   AMPUTATION Right 03/17/2021   Procedure: RIGHT BELOW KNEE AMPUTATION;  Surgeon: [name], MD;  Location: [place];  Service: Orthopedics;  Laterality: Right;   ANKLE FUSION Right 01/22/2021   Procedure: RIGHT FOOT TIBIOCALCANEAL FUSION;  Surgeon: [name], MD;  Location: [place];  Service: Orthopedics;  Laterality: Right;   ANTERIOR FUSION CERVICAL SPINE  2012   CARDIAC CATHETERIZATION  2011   CARDIAC CATHETERIZATION N/A 11/09/2016   Procedure: Right Heart Cath;  Surgeon: Belva Crome, MD;  Location: [place] [facility];  Service: Cardiovascular;  Laterality: N/A;   carpel tunnel     left wrist   CATARACT EXTRACTION     CATARACT EXTRACTION W/ INTRAOCULAR LENS  IMPLANT, BILATERAL  2013   CERVICAL LAMINECTOMY  2012   COLONOSCOPY N/A 01/14/2013   Procedure: COLONOSCOPY;  Surgeon: Irene Shipper, MD;  Location: [facility];  Service: Endoscopy;  Laterality: N/A;   CORONARY ARTERY BYPASS GRAFT  11/04/2020   LIMA-LAD,  SVG-OM1, SVG-PDA (Dr [facility]) dc 11/18/2020   EYE SURGERY     I & D EXTREMITY Right 02/19/2021   Procedure: RIGHT ANKLE DEBRIDEMENT AND PLACEMENT ANTIBIOTIC BEADS;  Surgeon: [name], MD;  Location: [place];  Service: Orthopedics;  Laterality: Right;   KNEE SURGERY  1998   left   LEFT HEART CATH AND CORONARY ANGIOGRAPHY N/A 07/10/2020   Procedure: LEFT HEART CATH AND CORONARY ANGIOGRAPHY;  Surgeon: Sherren Mocha, MD;  Location: [place] [facility];  Service: Cardiovascular;  Laterality: N/A;   LUMBAR LAMINECTOMY  2003   LUNG BIOPSY Left 12/26/2016   Procedure: LUNG BIOPSY;  Surgeon: [name], MD;  Location: [place];  Service: Thoracic;  Laterality: Left;   PACEMAKER IMPLANT N/A 03/30/2020   Procedure: PACEMAKER IMPLANT;  Surgeon: Evans Lance, MD;   Location: [place] [facility];  Service: Cardiovascular;  Laterality: N/A;   PERIPHERAL VASCULAR INTERVENTION Right 12/10/2020   Procedure: PERIPHERAL VASCULAR INTERVENTION;  Surgeon: Cherre Robins, MD;  Location: [place] [facility];  Service: Cardiovascular;  Laterality: Right;  SFA   POSTERIOR FUSION CERVICAL SPINE  2012   PPM GENERATOR REMOVAL N/A 12/14/2020   Procedure: PPM GENERATOR REMOVAL;  Surgeon: Evans Lance, MD;  Location: [place] [facility];  Service: Cardiovascular;  Laterality: N/A;   TEE WITHOUT CARDIOVERSION N/A 12/11/2020   Procedure: TRANSESOPHAGEAL ECHOCARDIOGRAM (TEE);  Surgeon: Geralynn Rile, MD;  Location: [place];  Service: Cardiovascular;  Laterality: N/A;   TRIGGER FINGER RELEASE  2011   4th finger left hand   VIDEO ASSISTED THORACOSCOPY Left 12/26/2016   Procedure: VIDEO ASSISTED THORACOSCOPY;  Surgeon: [name], MD;  Location: [place];  Service: Thoracic;  Laterality: Left;   VIDEO BRONCHOSCOPY N/A 12/26/2016   Procedure: VIDEO BRONCHOSCOPY;  Surgeon: [name], MD;  Location: [place];  Service: Thoracic;  Laterality: N/A;   Patient Active Problem List   Diagnosis Date Noted   Intermediate stage nonexudative age-related macular degeneration of both eyes 04/25/2022   Research study patient 12/24/2021   Post-operative pain    CKD (chronic kidney disease), stage II    Labile blood glucose    Pulmonary fibrosis ([place])    Hyponatremia    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[place]) 03/26/2021   S/P BKA (below knee amputation) unilateral, right ([place])    Shortness of breath    Acute blood loss anemia    Stage 3 chronic kidney disease ([place])    Atrial fibrillation ([place])    Controlled type 2 diabetes mellitus with hyperglycemia, with long-term current use of insulin (HCC)    Postoperative pain    Postoperative hemorrhagic shock 03/20/2021   Atherosclerosis of native arteries of extremities with gangrene, right leg ([place])    Hardware complicating wound  infection ([place])    Abscess of bursa of right ankle 02/15/2021   Endocarditis 01/19/2021   Peripheral vascular disease ([place]) 12/29/2020   CHF (congestive heart failure), NYHA class II, chronic, diastolic ([place]) 03/54/6568   History of COVID-19 12/22/2020   SSS (sick sinus syndrome) ([place]) 12/22/2020   Acute bacterial endocarditis    MSSA bacteremia 12/09/2020   Diabetic foot ulcer ([place]) 12/09/2020   Foot drop, right foot    Generalized weakness 12/08/2020   Dehydration with hyponatremia 12/08/2020   Atrial fibrillation, chronic ([place]) 12/08/2020   Elevated troponin level not due myocardial infarction 12/08/2020   Adrenal insufficiency ([place]) 11/14/2020   Orthostatic hypotension 11/14/2020   Physical deconditioning 11/14/2020  Difficulty sleeping 11/07/2020   Postoperative anemia due to acute blood loss 11/07/2020   Right ventricular dysfunction 11/05/2020   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/04/2020   Hearing loss 11/03/2020   Cardiac device in situ 09/09/2020   Coronary atherosclerosis due to lipid rich plaque 09/02/2020   COVID-19 virus infection 07/18/2020   Coronary artery disease involving native heart without angina pectoris 07/10/2020   Chronic kidney disease, stage 3a ([place]) 03/29/2020   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/28/2020   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 ([place]) 03/16/2020   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 ([place]) 03/16/2020   Right epiretinal membrane 03/16/2020   Posterior vitreous detachment of right eye 03/16/2020   Aortic stenosis, moderate 03/12/2020   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   Near syncope 04/27/2018   Hypoglycemia due to insulin 01/06/2018   Essential hypertension 01/06/2018   Diabetes mellitus type 2, with complication, on long term insulin pump ([place]) 01/06/2018   Syncope and collapse 01/05/2018   Encounter for therapeutic drug monitoring  06/29/2017   OSA on CPAP 05/05/2017   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[place]) 01/05/2017   Abnormal chest x-ray 10/12/2016   Benign neoplasm of colon 01/14/2013    REFERRING DIAG: Z89.511 (ICD-10-CM) - Hx of right BKA  ONSET DATE: 05/04/2022 MD visit with release to [facility] on LLE  THERAPY DIAG:  Unsteadiness on feet  Other abnormalities of gait and mobility  History of falling  Muscle weakness (generalized)  Abnormal posture  Rationale for Evaluation and Treatment Rehabilitation  PERTINENT HISTORY: CKD st III, AV block Mobitz, pacemaker, pulmonary fibrosis, CAD, CABG 21, HTN, A-Fib, DM2, PAD, right BKA, Left TMA  PRECAUTIONS: ICD/Pacemaker  SUBJECTIVE: He saw Brooke Pace, [facility] on Monday, 6/19 and next visit is 06/06/2022 with delivery probably middle of July.   PAIN:   Are you having pain? No   OBJECTIVE: (objective measures completed at initial evaluation unless otherwise dated)  OBJECTIVE:    COGNITION: Overall cognitive status: Within functional limits for tasks assessed   POSTURE: rounded shoulders, forward head, and flexed trunk    LOWER EXTREMITY ROM:   ROM P:passive  A:active Right eval Left eval  Hip flexion      Hip extension A: -25* standing A: -25* standing  Hip abduction      Hip adduction      Hip internal rotation      Hip external rotation      Knee flexion      Knee extension      Ankle dorsiflexion NA P: -17*  Ankle plantarflexion NA    Ankle inversion NA    Ankle eversion NA     (Blank rows = not tested)   LOWER EXTREMITY MMT:   MMT Right eval Left eval  Hip flexion      Hip extension Functional testing 3-/5 Functional testing 3-/5  Hip abduction Functional testing 3-/5 Functional testing 3-/5  Hip adduction      Hip internal rotation      Hip external rotation      Knee flexion Functional testing 3-/5 Functional testing 3-/5  Knee extension Functional testing 3/5 Functional testing 3/5  Ankle dorsiflexion      Ankle  plantarflexion      Ankle inversion      Ankle eversion      (Blank rows = not tested)   TRANSFERS: Sit to stand: SBA and requires use  of armrests from 20" stable w/c & needs RW for stabilization Stand to sit: SBA and requires use of armrests from 20" stable w/c & needs RW for stabilization   GAIT: Gait pattern: step to pattern, decreased step length- Left, decreased stance time- Right, Right hip hike, antalgic, lateral hip instability, trunk flexed, and wide BOS Distance walked: 25' Assistive device utilized: Environmental consultant - 2 wheeled and TTA prosthesis RLE Level of assistance: Min A Comments: pt reports dizziness with turning in gait   FUNCTIONAL TESTs:  Berg Balance Scale: 9/56 & tasks of Berg with RW support 27/56   [facility] PT Assessment - 05/09/22 0930                Berg Balance Test    Sit to Stand Needs moderate or maximal assist to stand   task with RW support = 3    Standing Unsupported Unable to stand 30 seconds unassisted   task with RW support = 3    Sitting with Back Unsupported but Feet Supported on Floor or Stool Able to sit safely and securely 2 minutes     Stand to Sit Uses backs of legs against chair to control descent   task with RW support = 3    Transfers Able to transfer safely, definite need of hands     Standing Unsupported with Eyes Closed Needs help to keep from falling   task with RW support = 3    Standing Unsupported with Feet Together Needs help to attain position and unable to hold for 15 seconds   task with RW support = 2    From Standing, Reach Forward with Outstretched Arm Loses balance while trying/requires external support   task with RW support = 1    From Standing Position, Pick up Object from Floor Unable to try/needs assist to keep balance   task with RW support = 2    From Standing Position, Turn to Look Behind Over each Shoulder Needs assist to keep from losing balance and falling   task with RW support = 1    Turn 360 Degrees Needs assistance while  turning   task with RW support = 0    Standing Unsupported, Alternately Place Feet on Step/Stool Needs assistance to keep from falling or unable to try   task with RW support = 0    Standing Unsupported, One Foot in [employer] balance while stepping or standing   task with RW support = 1    Standing on One Leg Unable to try or needs assist to prevent fall   task with RW support = 1    Total Score 9     Berg comment: tasks of Berg with RW support 27/56                     CURRENT PROSTHETIC WEAR ASSESSMENT: Patient is dependent with: skin check, residual limb care, care of non-amputated limb, prosthetic cleaning, ply sock cleaning, and correct ply sock adjustment Donning prosthesis: Modified independence Doffing prosthesis: Modified independence Prosthetic wear tolerance: >90% of awake hours/day, 7 days/week Prosthetic weight bearing tolerance: 5 minutes Edema: LLE pitting edema with >10 sec capillary refill Residual limb condition: Right BKA limb has no openings, normal temperature & moisture.  Redness patella & patella tendon from friction of liner with knee flexed. Cylinderical shape.  LLE TMA pt's wife showed picture of wound on dorsum of foot that is being managed by PCP.   Prosthetic description: silicon  liner with pin lock suspension, total contact socket, dynamic response foot.      TODAY'S TREATMENT:  05/26/2022 PT using 1/2" lift on LLE for activities.  PT limiting gait until he has AFO / partial foot prosthesis on LLE to avoid skin issues Therapeutic Exercise: Leg Press back 45* BLEs 106# 15 reps 1 sets HR84 SpO2 98%  single LE 50# 15 reps ea - LEs 100# 15 reps  sets HR87 SpO2 97%  single LE 50# 15 reps ea B2nd set back flat Hamstring stretch seated with strap BLEs 30 sec 2 reps ea LE PT cues Hip flexor stretch BLEs 1st rep hooklying & 2nd rep knee to chest supine dropping LE over edge 30 sec hold 2 reps ea LE  Neuromuscular Re-ed Standing in corner with RW in front feet  hip width & 1/2" lift under LLE: head turns right/left, up/down & diagonals. 1)on floor eyes open with light tactile cues.  2)on floor eyes closed with light UE support on outside of handles of RW with tactile cues  3)on foam eyes open with light UE support on outside of handles of RW with tactile cues & MinA. PT explained balance systems & rationale for exercises.  Pt's grandson observed PT session. Pt & grandson verbalized understanding of balance activities.     05/19/2022 PT using 1/2" lift on LLE for activities.  PT limiting gait until he has AFO / partial foot prosthesis on LLE to avoid skin issues Therapeutic Exercise: Nustep seat 11 level 7 with BLEs & BUEs for 8 min HR 127 SpO2 93% no SOB noted Leg Press BLEs 100# 15 reps 2 sets HR87 SpO2 98%  single LE 50# 15 reps ea - 1st set back 45* & 2nd set back flat Hamstring stretch SLR with strap with PT assist BLEs 30 sec 2 reps ea LE Hip flexor stretch BLEs 1st rep hooklying & 2nd rep knee to chest supine dropping LE over edge 30 sec hold 2 reps ea LE Verbal cues on pursed lip breathing with target exhale twice as long as inhale with activities.   Therapeutic Activities: PT demo & verbal cues on supine <> sit via sidelying to protect back and decrease dependence on need to pull on things. Pt verbalized understanding.   05/16/2022 PT using 1/2" lift on LLE for activities.  PT limiting gait until he has AFO / partial foot prosthesis on LLE to avoid skin issues Therapeutic Exercise: Nustep seat 11 level 7 with BLEs & BUEs for 8 min HR 124 SpO2 97% Leg Press BLEs 100# 15 reps 2 sets HR90 SpO2 94%  single LE 50# 10 reps ea Hamstring stretch SLR with PT assist 30 sec 2 reps ea LE  Neuromuscular Re-education / Balance Activities Standing feet hip width apart: EO on floor head movements 4 directions, EC on floor head movements 4 directions & EO on foam head movements 4 directions. MinA to [place] for balance reactions.  Pt noted BLE fatigue at  end    05/11/22 1636  PT Education  Education Details HEP at sink & sitting on bar stool to develop back strength / endurance  Person(s) Educated Patient;Spouse  Methods Explanation;Demonstration;Tactile cues;Verbal cues;Handout  Comprehension Verbalized understanding;Returned demonstration;Verbal cues required;Tactile cues required;Need further instruction   Neuromuscular Reed-  cues on proprioception RLE using residual limb, balance & standing tolerance.   Do each exercise 1-2  times per day Do each exercise 5-10 repetitions Hold each exercise for 2 seconds to feel your location  AT [facility]  FIND YOUR MIDLINE POSITION AND PLACE FEET EQUAL DISTANCE FROM THE MIDLINE.  Try to find this position when standing still for activities.   USE TAPE ON FLOOR TO MARK THE MIDLINE POSITION which is even with middle of sink.  You also should try to feel with your limb pressure in socket.  You are trying to feel with limb what you used to feel with the bottom of your foot.  Side to Side Shift: Moving your hips only (not shoulders): move weight onto your left leg, HOLD/FEEL pressure in socket.  Move back to equal weight on each leg, HOLD/FEEL pressure in socket. Move weight onto your right leg, HOLD/FEEL pressure in socket. Move back to equal weight on each leg, HOLD/FEEL pressure in socket. Repeat.  Start with both hands on sink, progress to hand on prosthetic side only, then no hands.  Front to Back Shift: Moving your hips only (not shoulders): move your weight forward onto your toes, HOLD/FEEL pressure in socket. Move your weight back to equal Flat Foot on both legs, HOLD/FEEL  pressure in socket. Move your weight back onto your heels, HOLD/FEEL  pressure in socket. Move your weight back to equal on both legs, HOLD/FEEL  pressure in socket. Repeat.  Start with both hands on sink, progress to hand on prosthetic side only, then no hands.  Moving Cones / Cups: With equal weight on each leg: Hold on with one hand  the first time, then progress to no hand supports. Move cups from one side of sink to the other. Place cups ~2" out of your reach, progress to 10" beyond reach.  Place one hand in middle of sink and reach with other hand. Do both arms.  Then hover one hand and move cups with other hand.  Overhead/Upward Reaching: alternated reaching up to top cabinets or ceiling if no cabinets present. Keep equal weight on each leg. Start with one hand support on counter while other hand reaches and progress to no hand support with reaching.  ace one hand in middle of sink and reach with other hand. Do both arms.  Then hover one hand and move cups with other hand.  5.   Looking Over Shoulders: With equal weight on each leg: alternate turning to look over your shoulders with one hand support on counter as needed.  Start with head motions only to look in front of shoulder, then even with shoulder and progress to looking behind you. To look to side, move head /eyes, then shoulder on side looking pulls back, shift more weight to side looking and pull hip back. Place one hand in middle of sink and let go with other hand so your shoulder can pull back. Switch hands to look other way.   Then hover one hand and look over shoulder. If looking right, use left hand at sink. If looking left, use right hand at sink. 6.  Alternate Tapping Toes or Stepping to lower shelf of cabinet  Move items under cabinet out of your way. Shift your hips/pelvis so weight on stance leg & Tighten muscles in hip.  SLOWLY step other leg so front of foot is in cabinet. Then step back to floor. Repeat with other leg.      ASSESSMENT:   CLINICAL IMPRESSION: Patient was challenged by standing balance activities in corner.  He reports wound continues to heal with use of Vivewear compression sock.  Pt continues to benefit from skilled PT to improve function & safety.    OBJECTIVE  IMPAIRMENTS Abnormal gait, cardiopulmonary status limiting activity, decreased  activity tolerance, decreased balance, decreased endurance, decreased knowledge of use of DME, decreased mobility, decreased ROM, decreased strength, dizziness, increased edema, impaired flexibility, postural dysfunction, prosthetic dependency , obesity, and pain.    ACTIVITY LIMITATIONS carrying, lifting, bending, standing, stairs, transfers, and locomotion level   PARTICIPATION LIMITATIONS: driving, community activity, and yard work   [place], Time since onset of injury/illness/exacerbation, and 3+ comorbidities: see PMH  are also affecting patient's functional outcome.    REHAB POTENTIAL: Good   CLINICAL DECISION MAKING: Evolving/moderate complexity   EVALUATION COMPLEXITY: Moderate     GOALS: Goals reviewed with patient? Yes   SHORT TERM GOALS: Target date: 06/08/2022   Patient demonstrates understanding of initial HEP.  Baseline: SEE OBJECTIVE DATA Goal status: INITIAL 2. Patient able to reach 7" and look over both shoulders with RW support with supervision. Baseline: SEE OBJECTIVE DATA Goal status: INITIAL   3. Patient ambulates 5' with RW & prosthesis with supervision. Baseline: SEE OBJECTIVE DATA Goal status: INITIAL     LONG TERM GOALS: Target date: 08/04/2022   Patient demonstrates & verbalized understanding of prosthetic care to enable safe utilization of prosthesis. Baseline: SEE OBJECTIVE DATA Goal status: INITIAL   Patient tolerates right prosthesis & left AFO / partial foot prosthesis wear >90% of awake hours without skin or limb pain issues. Baseline: SEE OBJECTIVE DATA Goal status: INITIAL   Berg Balance >/= 20/56 and tasks of Berg with RW support >/= 45/56 to indicate lower fall risk with standing ADLs Baseline: SEE OBJECTIVE DATA Goal status: INITIAL   Patient ambulates >300' with prosthesis & LRAD modified independent Baseline: SEE OBJECTIVE DATA Goal status: INITIAL   Patient negotiates ramps, curbs & stairs with single rail with  prosthesis & LRAD modified independent. Baseline: SEE OBJECTIVE DATA Goal status: INITIAL     PLAN: PT FREQUENCY: 2x/week   PT DURATION: 13 weeks (90 days)   PLANNED INTERVENTIONS: Therapeutic exercises, Therapeutic activity, Neuromuscular re-education, Balance training, Gait training, Patient/Family education, Stair training, Vestibular training, Canalith repositioning, Orthotic/Fit training, Prosthetic training, DME instructions, and physical performance testing   PLAN FOR NEXT SESSION:   monitor HR & SpO2 with activities, manual therapy & exercises for flexibility, Nustep, leg press, standing balance activities with 1/2" lift under LLE until he gets shoe / AFO   Jamey Reas, PT, DPT 05/26/2022, 10:56 AM

## 2022-05-30 ENCOUNTER — Encounter: Payer: Self-pay | Admitting: Physical Therapy

## 2022-05-30 ENCOUNTER — Ambulatory Visit (INDEPENDENT_AMBULATORY_CARE_PROVIDER_SITE_OTHER): Payer: Medicare Other | Admitting: Physical Therapy

## 2022-05-30 DIAGNOSIS — M6281 Muscle weakness (generalized): Secondary | ICD-10-CM | POA: Diagnosis not present

## 2022-05-30 DIAGNOSIS — R2681 Unsteadiness on feet: Secondary | ICD-10-CM

## 2022-05-30 DIAGNOSIS — Z9181 History of falling: Secondary | ICD-10-CM | POA: Diagnosis not present

## 2022-05-30 DIAGNOSIS — R293 Abnormal posture: Secondary | ICD-10-CM

## 2022-05-30 DIAGNOSIS — R2689 Other abnormalities of gait and mobility: Secondary | ICD-10-CM

## 2022-05-31 ENCOUNTER — Telehealth: Payer: Self-pay | Admitting: Internal Medicine

## 2022-06-02 ENCOUNTER — Ambulatory Visit (INDEPENDENT_AMBULATORY_CARE_PROVIDER_SITE_OTHER): Payer: Medicare Other | Admitting: Physical Therapy

## 2022-06-02 DIAGNOSIS — R2681 Unsteadiness on feet: Secondary | ICD-10-CM | POA: Diagnosis not present

## 2022-06-02 DIAGNOSIS — R2689 Other abnormalities of gait and mobility: Secondary | ICD-10-CM

## 2022-06-02 DIAGNOSIS — M6281 Muscle weakness (generalized): Secondary | ICD-10-CM | POA: Diagnosis not present

## 2022-06-02 NOTE — Therapy (Signed)
OUTPATIENT PHYSICAL THERAPY TREATMENT NOTE   Patient Name: Shane [name] MRN: [id number] DOB:1941/08/05, 81 y.o., male Today's Date: 06/02/2022  PCP: Burnard Bunting, MD REFERRING PROVIDER: [name], MD  END OF SESSION:   PT End of Session - 06/02/22 1042     Visit Number 7    Number of Visits 26    Date for PT Re-Evaluation 08/04/22    Authorization Type UHC Medicare    Authorization Time Period [facility] MEDICARE  $20 COPAY    Progress Note Due on Visit 10    PT Start Time 0845   wife signed him into PT while PT started session   PT Stop Time 0928    PT Time Calculation (min) 43 min    Equipment Utilized During Treatment Gait belt    Activity Tolerance Patient tolerated treatment well;Patient limited by fatigue                 Past Medical History:  Diagnosis Date   Anxiety    AV block, Mobitz 1    Cataract    Chronic kidney disease    d/t DM   CKD (chronic kidney disease), stage III ([place])    Depression    Diabetes mellitus    Vgo disposal insulin bolus  simular to insulin pump   Dyspnea    GERD (gastroesophageal reflux disease)    History of kidney stones    passed   Hyperlipidemia    Hypertension    Idiopathic pulmonary fibrosis ([place]) 11/2016   ILD (interstitial lung disease) ([place])    Moderate aortic stenosis    a. 10/2019 Echo: EF 55-60%, Gr2 DD. Nl RV.    Neuromuscular disorder ([place])    Neuropathy associated with endocrine disorder ([place])    Nonobstructive CAD (coronary artery disease)    a. 2012 Cath: mod, nonobs dzs; b. 10/2016 MV: EF 60%, no ischemia.   OSA on CPAP 05/05/2017   Unattended Home Sleep Test 7/2/813-AHI 38.6/hour, desaturation to 64%, body weight 261 pounds   PONV (postoperative nausea and vomiting)    Sleep apnea     uses cpap asked to bring mask and tubing   Past Surgical History:  Procedure Laterality Date   ABDOMINAL AORTOGRAM W/LOWER EXTREMITY N/A 12/10/2020   Procedure: ABDOMINAL AORTOGRAM W/LOWER EXTREMITY;  Surgeon:  Cherre Robins, MD;  Location: [place] [facility];  Service: Cardiovascular;  Laterality: N/A;   AMPUTATION Right 01/22/2021   Procedure: 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;  Surgeon: [name], MD;  Location: [place];  Service: Orthopedics;  Laterality: Right;   AMPUTATION Right 03/17/2021   Procedure: RIGHT BELOW KNEE AMPUTATION;  Surgeon: [name], MD;  Location: [place];  Service: Orthopedics;  Laterality: Right;   ANKLE FUSION Right 01/22/2021   Procedure: RIGHT FOOT TIBIOCALCANEAL FUSION;  Surgeon: [name], MD;  Location: [place];  Service: Orthopedics;  Laterality: Right;   ANTERIOR FUSION CERVICAL SPINE  2012   CARDIAC CATHETERIZATION  2011   CARDIAC CATHETERIZATION N/A 11/09/2016   Procedure: Right Heart Cath;  Surgeon: Belva Crome, MD;  Location: [place] [facility];  Service: Cardiovascular;  Laterality: N/A;   carpel tunnel     left wrist   CATARACT EXTRACTION     CATARACT EXTRACTION W/ INTRAOCULAR LENS  IMPLANT, BILATERAL  2013   CERVICAL LAMINECTOMY  2012   COLONOSCOPY N/A 01/14/2013   Procedure: COLONOSCOPY;  Surgeon: Irene Shipper, MD;  Location: [facility];  Service: Endoscopy;  Laterality:  N/A;   CORONARY ARTERY BYPASS GRAFT  11/04/2020   LIMA-LAD, SVG-OM1, SVG-PDA (Dr Marney [facility]) dc 11/18/2020   EYE SURGERY     I & D EXTREMITY Right 02/19/2021   Procedure: RIGHT ANKLE DEBRIDEMENT AND PLACEMENT ANTIBIOTIC BEADS;  Surgeon: [name], MD;  Location: [place];  Service: Orthopedics;  Laterality: Right;   KNEE SURGERY  1998   left   LEFT HEART CATH AND CORONARY ANGIOGRAPHY N/A 07/10/2020   Procedure: LEFT HEART CATH AND CORONARY ANGIOGRAPHY;  Surgeon: Sherren Mocha, MD;  Location: [place] [facility];  Service: Cardiovascular;  Laterality: N/A;   LUMBAR LAMINECTOMY  2003   LUNG BIOPSY Left 12/26/2016   Procedure: LUNG BIOPSY;  Surgeon: [name], MD;  Location: [place];  Service: Thoracic;  Laterality: Left;   PACEMAKER IMPLANT N/A 03/30/2020   Procedure:  PACEMAKER IMPLANT;  Surgeon: Evans Lance, MD;  Location: [place] [facility];  Service: Cardiovascular;  Laterality: N/A;   PERIPHERAL VASCULAR INTERVENTION Right 12/10/2020   Procedure: PERIPHERAL VASCULAR INTERVENTION;  Surgeon: Cherre Robins, MD;  Location: [place] [facility];  Service: Cardiovascular;  Laterality: Right;  SFA   POSTERIOR FUSION CERVICAL SPINE  2012   PPM GENERATOR REMOVAL N/A 12/14/2020   Procedure: PPM GENERATOR REMOVAL;  Surgeon: Evans Lance, MD;  Location: [place] [facility];  Service: Cardiovascular;  Laterality: N/A;   TEE WITHOUT CARDIOVERSION N/A 12/11/2020   Procedure: TRANSESOPHAGEAL ECHOCARDIOGRAM (TEE);  Surgeon: Geralynn Rile, MD;  Location: [place];  Service: Cardiovascular;  Laterality: N/A;   TRIGGER FINGER RELEASE  2011   4th finger left hand   VIDEO ASSISTED THORACOSCOPY Left 12/26/2016   Procedure: VIDEO ASSISTED THORACOSCOPY;  Surgeon: [name], MD;  Location: [place];  Service: Thoracic;  Laterality: Left;   VIDEO BRONCHOSCOPY N/A 12/26/2016   Procedure: VIDEO BRONCHOSCOPY;  Surgeon: [name], MD;  Location: [place];  Service: Thoracic;  Laterality: N/A;   Patient Active Problem List   Diagnosis Date Noted   Intermediate stage nonexudative age-related macular degeneration of both eyes 04/25/2022   Research study patient 12/24/2021   Post-operative pain    CKD (chronic kidney disease), stage II    Labile blood glucose    Pulmonary fibrosis ([place])    Hyponatremia    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[place]) 03/26/2021   S/P BKA (below knee amputation) unilateral, right ([place])    Shortness of breath    Acute blood loss anemia    Stage 3 chronic kidney disease ([place])    Atrial fibrillation ([place])    Controlled type 2 diabetes mellitus with hyperglycemia, with long-term current use of insulin ([facility])    Postoperative pain    Postoperative hemorrhagic shock 03/20/2021   Atherosclerosis of native arteries of extremities with  gangrene, right leg ([place])    Hardware complicating wound infection ([place])    Abscess of bursa of right ankle 02/15/2021   Endocarditis 01/19/2021   Peripheral vascular disease ([place]) 12/29/2020   CHF (congestive heart failure), NYHA class II, chronic, diastolic ([place]) 97/67/3419   History of COVID-19 12/22/2020   SSS (sick sinus syndrome) ([place]) 12/22/2020   Acute bacterial endocarditis    MSSA bacteremia 12/09/2020   Diabetic foot ulcer ([place]) 12/09/2020   Foot drop, right foot    Generalized weakness 12/08/2020   Dehydration with hyponatremia 12/08/2020   Atrial fibrillation, chronic ([place]) 12/08/2020   Elevated troponin level not due myocardial infarction 12/08/2020   Adrenal insufficiency ([place]) 11/14/2020  Orthostatic hypotension 11/14/2020   Physical deconditioning 11/14/2020   Difficulty sleeping 11/07/2020   Postoperative anemia due to acute blood loss 11/07/2020   Right ventricular dysfunction 11/05/2020   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/04/2020   Hearing loss 11/03/2020   Cardiac device in situ 09/09/2020   Coronary atherosclerosis due to lipid rich plaque 09/02/2020   COVID-19 virus infection 07/18/2020   Coronary artery disease involving native heart without angina pectoris 07/10/2020   Chronic kidney disease, stage 3a ([place]) 03/29/2020   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/28/2020   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 ([place]) 03/16/2020   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 ([place]) 03/16/2020   Right epiretinal membrane 03/16/2020   Posterior vitreous detachment of right eye 03/16/2020   Aortic stenosis, moderate 03/12/2020   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   Near syncope 04/27/2018   Hypoglycemia due to insulin 01/06/2018   Essential hypertension 01/06/2018   Diabetes mellitus type 2, with complication, on long term insulin pump ([place]) 01/06/2018   Syncope and  collapse 01/05/2018   Encounter for therapeutic drug monitoring 06/29/2017   OSA on CPAP 05/05/2017   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[place]) 01/05/2017   Abnormal chest x-ray 10/12/2016   Benign neoplasm of colon 01/14/2013    REFERRING DIAG: Z89.511 (ICD-10-CM) - Hx of right BKA  ONSET DATE: 05/04/2022 MD visit with release to WB on LLE  THERAPY DIAG:  Unsteadiness on feet  Other abnormalities of gait and mobility  Muscle weakness (generalized)  Rationale for Evaluation and Treatment Rehabilitation  PERTINENT HISTORY: CKD st III, AV block Mobitz, pacemaker, pulmonary fibrosis, CAD, CABG 21, HTN, A-Fib, DM2, PAD, right BKA, Left TMA  PRECAUTIONS: ICD/Pacemaker  SUBJECTIVE: He initially stands upright for a while looking around prior to transfers and he is tolerating better.     PAIN:   Are you having pain?  No   OBJECTIVE: (objective measures completed at initial evaluation unless otherwise dated)  OBJECTIVE:    COGNITION: Overall cognitive status: Within functional limits for tasks assessed   POSTURE: 05/09/2022:  rounded shoulders, forward head, and flexed trunk    LOWER EXTREMITY ROM:   ROM P:passive  A:active Right Eval 05/09/2022 Left [place] 6/5/2023l  Hip flexion      Hip extension A: -25* standing A: -25* standing  Hip abduction      Hip adduction      Hip internal rotation      Hip external rotation      Knee flexion      Knee extension      Ankle dorsiflexion NA P: -17*  Ankle plantarflexion NA    Ankle inversion NA    Ankle eversion NA     (Blank rows = not tested)   LOWER EXTREMITY MMT:   MMT Right Eval 05/09/2022 Left Eval 05/09/2022  Hip flexion      Hip extension Functional testing 3-/5 Functional testing 3-/5  Hip abduction Functional testing 3-/5 Functional testing 3-/5  Hip adduction      Hip internal rotation      Hip external rotation      Knee flexion Functional testing 3-/5 Functional testing 3-/5  Knee extension Functional  testing 3/5 Functional testing 3/5  Ankle dorsiflexion      Ankle plantarflexion      Ankle inversion      Ankle eversion      (Blank rows = not tested)  TRANSFERS: 05/09/2022:  Sit to stand: SBA and requires use of armrests from 20" stable w/c & needs RW for stabilization 05/09/2022:  Stand to sit: SBA and requires use of armrests from 20" stable w/c & needs RW for stabilization   GAIT: 05/09/2022:  Gait pattern: step to pattern, decreased step length- Left, decreased stance time- Right, Right hip hike, antalgic, lateral hip instability, trunk flexed, and wide BOS Distance walked: 25' Assistive device utilized: Environmental consultant - 2 wheeled and TTA prosthesis RLE Level of assistance: Min A Comments: pt reports dizziness with turning in gait   FUNCTIONAL TESTs:  Berg Balance Scale: 05/09/2022:  9/56 & tasks of Berg with RW support 27/56    [facility] PT Assessment - 05/09/22 0930                Berg Balance Test    Sit to Stand Needs moderate or maximal assist to stand   task with RW support = 3    Standing Unsupported Unable to stand 30 seconds unassisted   task with RW support = 3    Sitting with Back Unsupported but Feet Supported on Floor or Stool Able to sit safely and securely 2 minutes     Stand to Sit Uses backs of legs against chair to control descent   task with RW support = 3    Transfers Able to transfer safely, definite need of hands     Standing Unsupported with Eyes Closed Needs help to keep from falling   task with RW support = 3    Standing Unsupported with Feet Together Needs help to attain position and unable to hold for 15 seconds   task with RW support = 2    From Standing, Reach Forward with Outstretched Arm Loses balance while trying/requires external support   task with RW support = 1    From Standing Position, Pick up Object from Floor Unable to try/needs assist to keep balance   task with RW support = 2    From Standing Position, Turn to Look Behind Over each Shoulder Needs  assist to keep from losing balance and falling   task with RW support = 1    Turn 360 Degrees Needs assistance while turning   task with RW support = 0    Standing Unsupported, Alternately Place Feet on Step/Stool Needs assistance to keep from falling or unable to try   task with RW support = 0    Standing Unsupported, One Foot in [employer] balance while stepping or standing   task with RW support = 1    Standing on One Leg Unable to try or needs assist to prevent fall   task with RW support = 1    Total Score 9     Berg comment: tasks of Berg with RW support 27/56                     CURRENT PROSTHETIC WEAR ASSESSMENT: 05/09/2022:   Patient is dependent with: skin check, residual limb care, care of non-amputated limb, prosthetic cleaning, ply sock cleaning, and correct ply sock adjustment Donning prosthesis: Modified independence Doffing prosthesis: Modified independence Prosthetic wear tolerance: >90% of awake hours/day, 7 days/week Prosthetic weight bearing tolerance: 5 minutes Edema: LLE pitting edema with >10 sec capillary refill Residual limb condition: Right BKA limb has no openings, normal temperature & moisture.  Redness patella & patella tendon from friction of liner with knee flexed. Cylinderical shape.  LLE TMA pt's  wife showed picture of wound on dorsum of foot that is being managed by PCP.   Prosthetic description: silicon liner with pin lock suspension, total contact socket, dynamic response foot.      TODAY'S TREATMENT:  06/02/2022 PT using 1/2" lift on LLE for activities.  PT limiting gait until he has AFO / partial foot prosthesis on LLE to avoid skin issues Therapeutic Exercise: Leg Press back 45* BLEs 118# 20 reps 1 sets  single LE 56# 10 reps 2 sets ea HR 125 SpO2 97%;  Back flat LEs 112# 20 reps  1 set  single LE 56# 10 reps 2 sets ea LE HR 123 SpO2 95%  Hamstring stretch seated with strap BLEs 30 sec 2 reps ea LE PT assist Hip flexor stretch BLEs 1st rep  hooklying & 2nd rep knee to chest supine dropping LE over edge 30 sec hold 2 reps ea LE  Neuromuscular Re-ed Standing in //bars with BUE support LLE on foam pad for height difference & protection:  PT demo & verbal cues on technique and mirror for visual feedback & upright posture.   Stepping ea LE forward to initial contact position & back to terminal stance 10 reps ea LE.   Stepping ea LE abduction to adduction alternating ant & post of contralateral LE 10 reps ea LE. HR 133 SpO2 95%  05/30/2022 PT using 1/2" lift on LLE for activities.  PT limiting gait until he has AFO / partial foot prosthesis on LLE to avoid skin issues Therapeutic Exercise: Leg Press back 45* BLEs 112# 20 reps 1 sets  single LE 56# 20 reps ea HR 120 SpO2 97%;  Back flat LEs 106# 20 reps  1 set  single LE 56# 10 reps 2 sets ea LE HR 119 SpO2 96%  Hamstring stretch seated with strap BLEs 30 sec 2 reps ea LE PT assist Hip flexor stretch BLEs 1st rep hooklying & 2nd rep knee to chest supine dropping LE over edge 30 sec hold 2 reps ea LE  Neuromuscular Re-ed Standing in //bars with BUE support (cues to lighten weight bearing) feet hip width & 1/2" lift under LLE: head turns right/left, up/down & diagonals. 1)on floor eyes open with tactile cues.  2)on floor eyes closed with light UE support with tactile cues  3)on foam eyes open with light UE support on outside of handles of RW with tactile cues & MinA.  Repeated 1 & 2 seated on 24" bar stool without UE support.   HR 132 SpO2 96%  seated rest for 2 min until HR returned to safe level <100 bpm.   05/26/2022 PT using 1/2" lift on LLE for activities.  PT limiting gait until he has AFO / partial foot prosthesis on LLE to avoid skin issues Therapeutic Exercise: Leg Press back 45* BLEs 106# 15 reps 1 sets HR84 SpO2 98%  single LE 50# 15 reps ea - LEs 100# 15 reps  sets HR87 SpO2 97%  single LE 50# 15 reps ea B2nd set back flat Hamstring stretch seated with strap BLEs 30 sec 2 reps ea  LE PT cues Hip flexor stretch BLEs 1st rep hooklying & 2nd rep knee to chest supine dropping LE over edge 30 sec hold 2 reps ea LE  Neuromuscular Re-ed Standing in corner with RW in front feet hip width & 1/2" lift under LLE: head turns right/left, up/down & diagonals. 1)on floor eyes open with light tactile cues.  2)on floor eyes closed with light UE support  on outside of handles of RW with tactile cues  3)on foam eyes open with light UE support on outside of handles of RW with tactile cues & MinA. PT explained balance systems & rationale for exercises.  Pt's grandson observed PT session. Pt & grandson verbalized understanding of balance activities.      05/11/22 1636  PT Education  Education Details HEP at sink & sitting on bar stool to develop back strength / endurance  Person(s) Educated Patient;Spouse  Methods Explanation;Demonstration;Tactile cues;Verbal cues;Handout  Comprehension Verbalized understanding;Returned demonstration;Verbal cues required;Tactile cues required;Need further instruction   Neuromuscular Reed-  cues on proprioception RLE using residual limb, balance & standing tolerance.   Do each exercise 1-2  times per day Do each exercise 5-10 repetitions Hold each exercise for 2 seconds to feel your location  AT [place].  Try to find this position when standing still for activities.   USE TAPE ON FLOOR TO MARK THE MIDLINE POSITION which is even with middle of sink.  You also should try to feel with your limb pressure in socket.  You are trying to feel with limb what you used to feel with the bottom of your foot.  Side to Side Shift: Moving your hips only (not shoulders): move weight onto your left leg, HOLD/FEEL pressure in socket.  Move back to equal weight on each leg, HOLD/FEEL pressure in socket. Move weight onto your right leg, HOLD/FEEL pressure in socket. Move back to equal weight on each leg,  HOLD/FEEL pressure in socket. Repeat.  Start with both hands on sink, progress to hand on prosthetic side only, then no hands.  Front to Back Shift: Moving your hips only (not shoulders): move your weight forward onto your toes, HOLD/FEEL pressure in socket. Move your weight back to equal Flat Foot on both legs, HOLD/FEEL  pressure in socket. Move your weight back onto your heels, HOLD/FEEL  pressure in socket. Move your weight back to equal on both legs, HOLD/FEEL  pressure in socket. Repeat.  Start with both hands on sink, progress to hand on prosthetic side only, then no hands.  Moving Cones / Cups: With equal weight on each leg: Hold on with one hand the first time, then progress to no hand supports. Move cups from one side of sink to the other. Place cups ~2" out of your reach, progress to 10" beyond reach.  Place one hand in middle of sink and reach with other hand. Do both arms.  Then hover one hand and move cups with other hand.  Overhead/Upward Reaching: alternated reaching up to top cabinets or ceiling if no cabinets present. Keep equal weight on each leg. Start with one hand support on counter while other hand reaches and progress to no hand support with reaching.  ace one hand in middle of sink and reach with other hand. Do both arms.  Then hover one hand and move cups with other hand.  5.   Looking Over Shoulders: With equal weight on each leg: alternate turning to look over your shoulders with one hand support on counter as needed.  Start with head motions only to look in front of shoulder, then even with shoulder and progress to looking behind you. To look to side, move head /eyes, then shoulder on side looking pulls back, shift more weight to side looking and pull hip back. Place one hand in middle of sink and let go with other hand  so your shoulder can pull back. Switch hands to look other way.   Then hover one hand and look over shoulder. If looking right, use left hand at sink. If looking  left, use right hand at sink. 6.  Alternate Tapping Toes or Stepping to lower shelf of cabinet  Move items under cabinet out of your way. Shift your hips/pelvis so weight on stance leg & Tighten muscles in hip.  SLOWLY step other leg so front of foot is in cabinet. Then step back to floor. Repeat with other leg.      ASSESSMENT:   CLINICAL IMPRESSION: Patient is improving standing balance with slow progression to time.  He reports less balance & no dizziness with movements now.     OBJECTIVE IMPAIRMENTS Abnormal gait, cardiopulmonary status limiting activity, decreased activity tolerance, decreased balance, decreased endurance, decreased knowledge of use of DME, decreased mobility, decreased ROM, decreased strength, dizziness, increased edema, impaired flexibility, postural dysfunction, prosthetic dependency , obesity, and pain.    ACTIVITY LIMITATIONS carrying, lifting, bending, standing, stairs, transfers, and locomotion level   PARTICIPATION LIMITATIONS: driving, community activity, and yard work   [place], Time since onset of injury/illness/exacerbation, and 3+ comorbidities: see PMH  are also affecting patient's functional outcome.    REHAB POTENTIAL: Good   CLINICAL DECISION MAKING: Evolving/moderate complexity   EVALUATION COMPLEXITY: Moderate     GOALS: Goals reviewed with patient? Yes   SHORT TERM GOALS: Target date: 06/08/2022   Patient demonstrates understanding of initial HEP.  Baseline: SEE OBJECTIVE DATA Goal status: INITIAL 2. Patient able to reach 7" and look over both shoulders with RW support with supervision. Baseline: SEE OBJECTIVE DATA Goal status: INITIAL   3. Patient ambulates 66' with RW & prosthesis with supervision. Baseline: SEE OBJECTIVE DATA Goal status: INITIAL     LONG TERM GOALS: Target date: 08/04/2022   Patient demonstrates & verbalized understanding of prosthetic care to enable safe utilization of prosthesis. Baseline: SEE  OBJECTIVE DATA Goal status: INITIAL   Patient tolerates right prosthesis & left AFO / partial foot prosthesis wear >90% of awake hours without skin or limb pain issues. Baseline: SEE OBJECTIVE DATA Goal status: INITIAL   Berg Balance >/= 20/56 and tasks of Berg with RW support >/= 45/56 to indicate lower fall risk with standing ADLs Baseline: SEE OBJECTIVE DATA Goal status: INITIAL   Patient ambulates >300' with prosthesis & LRAD modified independent Baseline: SEE OBJECTIVE DATA Goal status: INITIAL   Patient negotiates ramps, curbs & stairs with single rail with prosthesis & LRAD modified independent. Baseline: SEE OBJECTIVE DATA Goal status: INITIAL     PLAN: PT FREQUENCY: 2x/week   PT DURATION: 13 weeks (90 days)   PLANNED INTERVENTIONS: Therapeutic exercises, Therapeutic activity, Neuromuscular re-education, Balance training, Gait training, Patient/Family education, Stair training, Vestibular training, Canalith repositioning, Orthotic/Fit training, Prosthetic training, DME instructions, and physical performance testing   PLAN FOR NEXT SESSION:   check STGs,  continue to monitor HR & SpO2 with activities, manual therapy & exercises for flexibility, leg press, standing balance activities with 1/2" lift under LLE until he gets shoe / AFO   Jamey Reas, PT, DPT 06/02/2022, 10:50 AM

## 2022-06-06 ENCOUNTER — Ambulatory Visit: Payer: Medicare Other | Admitting: Internal Medicine

## 2022-06-08 ENCOUNTER — Ambulatory Visit: Payer: Medicare Other | Admitting: Internal Medicine

## 2022-06-08 ENCOUNTER — Encounter: Payer: Self-pay | Admitting: Internal Medicine

## 2022-06-08 VITALS — BP 112/58 | HR 60 | Temp 98.4°F | Ht 72.0 in | Wt 241.6 lb

## 2022-06-08 DIAGNOSIS — J84112 Idiopathic pulmonary fibrosis: Secondary | ICD-10-CM

## 2022-06-08 NOTE — Progress Notes (Signed)
OV May 2021 Shane [name] 81 y.o. -returns for follow-up of his pulmonary fibrosis.  Last seen in October 2020.  He is here with his wife who normally accompanies him but have not seen her since the onset of the pandemic.  Now that society slowly reopening she is accompanied him for the visit.  In the interim to his April and 2021 he had syncope at home.  He was rushed to the hospital had pacemaker implanted.  After this he states that shortness of breath with exertion is better.  He says his syncope might be improving.  Although he does think there is autonomic issues with syncope.  In addition he has right lower extremity ankle weakness and other gait imbalance issues from spinal issues.  This also puts him at fall risk.  He is continues with his open label extension study where he is getting active investigational medical product against connective tissue growth factor.  He feels this is working for him.  He feels he is stable.  However objectively symptom score seem to be a little bit worse compared to October 2020.  He did a slow walking desaturation test and his pulse ox dropped 5 points.  This is a very limited study because of his imbalance issues.  Otherwise feeling okay.  Is up-to-date with his Covid vaccine.  He wants to start attending maintenance pulmonary rehabilitation.  Have sent an email to Ms. Stackhouse the Medical sales representative inquiring.  He is compliant with his CPAP at home.   HRCT OCT 2020 compared to June 2020  - research IMPRESSION: 1. Stable findings of interstitial lung disease, with a spectrum of imaging findings again considered probable usual interstitial pneumonia (UIP) per current ATS guidelines. 2. Dilatation of the pulmonic trunk (4.3 cm in diameter), suggestive of associated pulmonary arterial hypertension. 3. Aortic atherosclerosis, in addition to left main and 3 vessel coronary artery disease. Assessment for potential risk factor modification, dietary therapy  or pharmacologic therapy may be warranted, if clinically indicated. 4. There are severe calcifications of the aortic valve. Echocardiographic correlation for evaluation of potential valvular dysfunction may be warranted if clinically indicated.   Aortic Atherosclerosis (ICD10-I70.0).     Electronically Signed   By: Vinnie Langton M.D.   On: 10/01/2019 09:34   ROS - per HPI   OV 06/08/2022  Subjective:  Patient ID: Shane [name], male , DOB: [birth year] , age 47 y.o. , MRN: [id number] , ADDRESS: [place] [id number] PCP Burnard Bunting, MD Patient Care Team: Burnard Bunting, MD as PCP - General (Internal Medicine) Burnard Bunting, MD as PCP - Internal Medicine (Internal Medicine) Lelon Perla, MD as PCP - Cardiology (Cardiology) Evans Lance, MD as PCP - Electrophysiology (Cardiology)  This Provider for this visit: Treatment Team:  Attending Provider: Brand Males, MD   Follow-up idiopathic pulmonary fibrosis biopsy-proven January 2018 and on MDD Jul 17, 2018. HAs Indeterminate CT wihtout emphysema.  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   - Ofev failure - Started on Ofev July 2018 - stopped in march 2019 due to diarrhea , syncope   - [facility] Study - IV Monoclonal Antibody Pamrevlumab v placebo - MAb against CTGF - since late  2019      - Open Label Extension - STOPPED JUne 2023 -steady without efficacy   - participates in IPF restirsty   Associated balance issues and mild AS  with Gr2 diastolic dysfynction and enlarged PA (normal stres test 2017) and right ankle (gait issues due to spine and RLE issues)  Syncope s/p pace maker March 30, 2020  Sleep apnea on CPAP  06/08/2022 -   Chief Complaint  Patient presents with   Follow-up    Pt states his breathing has improved in the last year.     HPI Shane [name] 81 y.o. -returns for follow-up.  His wife  [name] is here with him.  He was on open label extension monoclonal antibody pamrevlumab against connective tissue growth factor every 3 weeks.  However the study was stopped last month June 2023 because of lack of efficacy.  No safety signals.  However he does tell me that he picked up psoriasis on his back and forearms in the last few months.  He is wondering if it is from the monoclonal antibody.  I certainly do not recall seeing the side effect in the safety reports although drug-related rash is noted.  The Sub investigators marked this is not related to the study drug.  I have indicated to him that we will know more details when the data comes out.  In any event he is off the infusion at this point.  In terms of his respiratory healthy stable in fact he is feeling better.  This is because he is also lost weight.  He has had multiple health issues.  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  He has a prosthesis.  He is also had left metatarsal amputation.  In October 2022 he spent time in nursing home.  Also got COVID while getting one of the research infusions.  After this he lost taste and then the appetite has been low but this is then contributed to weight loss.  The weight loss is actually making him feel better.  He is waiting for another prosthesis on his left side so he can start walking.  He is interested in other research studies   SYMPTOM SCALE - ILD 10/02/2019  04/07/2020  06/08/2022   O2 use RA ra Ra, R BKA and Left fett amputation  Shortness of Breath 0 -> 5 scale with 5 being worst (score 6 If unable to do)    At rest 0 0 0  Simple tasks - showers, clothes change, eating, shaving 3 3 1   Household (dishes, doing bed, laundry) 3 4 2   Shopping 3 5 0  Walking level at own pace 4 5 4   Walking up Stairs 5 5 4   Total (40 - 48) Dyspnea Score 18 22 11   How bad is your cough? 1 2 1   How bad is your fatigue 3 3 3   nausea  0 0  vomit  0 0  diarrhea  2 0  anxiety  0 1  depression  0 1        Simple office walk 185 feet x  3 laps goal with forehead probe 03/26/18 05/23/2018  07/15/2019   04/07/2020   O2 used Room air Room air Room air room air with walker -d d only 1 lap due to balane issues, stopped x 1  Number laps completed All 3 All 3 Could not do all 3 due to gait imbalance and using cane   Comments about pace Normal  But balanceissue Normal pace Normal pace   Resting Pulse Ox/HR  99% and 70/min 99% and HR 70/min 100% and hr 91  Final Pulse Ox/HR  96% and 97/min 95% and HR  95/min 95% and HR 75  Desaturated </= 88%  no no no  Desaturated <= 3% points  yes Yes even with 1 lap Yes, 5points  Got Tachycardic >/= 90/min  yes yes no  Symptoms at end of test  No symptoms Mild dyspnea Dyspnea but improved  Miscellaneous comments  wobbbly legs and balance issues - hitting into wall per CMA - losing balance Balance issues precluded all 3 laps Balance issues    Results for DERELLE, COCKRELL (MRN [id number]) as of 05/23/2018 12:02  Ref. Range 10/14/2016 08:19 04/06/2017 10:13 10/04/2017 08:51 12/25/2017 08:45 05/23/2018 10:37 04/06/2019 research 07/10/2019 researhc 10/04/2019 after this clinici visit - research  FVC-Pre Latest Units: L 3.00 2.94 2.94 2.88 2.89 2.71 2.72 2.583  FVC-%Pred-Pre Latest Units: % 63 62 62 61 61 64% 64% 61.1%    Results for LEWI, DROST (MRN [id number]) as of 05/23/2018 12:02  Ref. Range 10/14/2016 08:19 04/06/2017 10:13 10/04/2017 08:51 12/25/2017 08:45 05/23/2018 10:37 09/21/2018 -  DLCO unc Latest Units: ml/min/mmHg 16.82 16.43 15.04 14.56 17.13 15.26  DLCO unc % pred Latest Units: % 46 45 41 40 47 43%    CT Chest data  No results found.    PFT     Latest Ref Rng & Units 09/21/2018   12:08 PM 05/23/2018   10:37 AM 12/25/2017    9:01 AM 10/04/2017    9:35 AM 04/06/2017   10:13 AM 10/14/2016    8:19 AM  PFT Results  FVC-Pre L  2.89  P 2.88  2.94  2.94  3.00   FVC-Predicted Pre %  61  P 61  62  62  63   FVC-Post L      3.00   FVC-Predicted Post %       63   Pre FEV1/FVC % %  91  P 86  92  91  90   Post FEV1/FCV % %      92   FEV1-Pre L  2.62  P 2.47  2.69  2.69  2.70   FEV1-Predicted Pre %  77  P 72  79  78  78   FEV1-Post L      2.75   DLCO uncorrected ml/min/mmHg 14.95  17.13  P 14.56  15.04  16.43  16.82   DLCO UNC% % 42  47  P 40  41  45  46   DLCO corrected ml/min/mmHg 15.26  17.33  P 15.57  15.31  19.35    DLCO COR %Predicted % 43  47  P 42  42  53    DLVA Predicted % 83  91  P 83  79  99  81   TLC L      4.62   TLC % Predicted %      60   RV % Predicted %      60     P Preliminary result       has a past medical history of Anxiety, AV block, Mobitz 1, Cataract, Chronic kidney disease, CKD (chronic kidney disease), stage III (HCC), Depression, Diabetes mellitus, Dyspnea, GERD (gastroesophageal reflux disease), History of kidney stones, Hyperlipidemia, Hypertension, Idiopathic pulmonary fibrosis (HCC) (11/2016), ILD (interstitial lung disease) ([place]), Moderate aortic stenosis, Neuromuscular disorder (HCC), Neuropathy associated with endocrine disorder ([place]), Nonobstructive CAD (coronary artery disease), OSA on CPAP (05/05/2017), PONV (postoperative nausea and vomiting), and Sleep apnea.   reports that he has never smoked. He has never used smokeless tobacco.  Past Surgical History:  Procedure Laterality Date   ABDOMINAL AORTOGRAM W/LOWER EXTREMITY N/A 12/10/2020   Procedure: ABDOMINAL AORTOGRAM W/LOWER EXTREMITY;  Surgeon: Cherre Robins, MD;  Location: [facility];  Service: Cardiovascular;  Laterality: N/A;   AMPUTATION Right 01/22/2021   Procedure: 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;  Surgeon: [name], MD;  Location: [facility];  Service: Orthopedics;  Laterality: Right;   AMPUTATION Right 03/17/2021   Procedure: RIGHT BELOW KNEE AMPUTATION;  Surgeon: [name], MD;  Location: [facility];  Service: Orthopedics;  Laterality: Right;   ANKLE FUSION Right 01/22/2021   Procedure: RIGHT FOOT TIBIOCALCANEAL FUSION;  Surgeon: [name], MD;  Location: [facility];  Service: Orthopedics;  Laterality: Right;   ANTERIOR FUSION CERVICAL SPINE  2012   CARDIAC CATHETERIZATION  2011   CARDIAC CATHETERIZATION N/A 11/09/2016   Procedure: Right Heart Cath;  Surgeon: Belva Crome, MD;  Location: [facility];  Service: Cardiovascular;  Laterality: N/A;   carpel tunnel     left wrist   CATARACT EXTRACTION     CATARACT EXTRACTION W/ INTRAOCULAR LENS  IMPLANT, BILATERAL  2013   CERVICAL LAMINECTOMY  2012   COLONOSCOPY N/A 01/14/2013   Procedure: COLONOSCOPY;  Surgeon: Irene Shipper, MD;  Location: [facility];  Service: Endoscopy;  Laterality: N/A;   CORONARY ARTERY BYPASS GRAFT  11/04/2020   LIMA-LAD, SVG-OM1, SVG-PDA (Dr Marney [facility]) dc 11/18/2020   EYE SURGERY     I & D EXTREMITY Right 02/19/2021   Procedure: RIGHT ANKLE DEBRIDEMENT AND PLACEMENT ANTIBIOTIC BEADS;  Surgeon: [name], MD;  Location: [facility];  Service: Orthopedics;  Laterality: Right;   KNEE SURGERY  1998   left   LEFT HEART CATH AND CORONARY ANGIOGRAPHY N/A 07/10/2020   Procedure: LEFT HEART CATH AND CORONARY ANGIOGRAPHY;  Surgeon: Sherren Mocha, MD;  Location: [facility];  Service: Cardiovascular;  Laterality: N/A;   LUMBAR LAMINECTOMY  2003   LUNG BIOPSY Left 12/26/2016   Procedure: LUNG BIOPSY;  Surgeon: [name], MD;  Location: [facility];  Service: Thoracic;  Laterality: Left;   PACEMAKER IMPLANT N/A 03/30/2020   Procedure: PACEMAKER IMPLANT;  Surgeon: Evans Lance, MD;  Location: [facility];  Service: Cardiovascular;  Laterality: N/A;   PERIPHERAL VASCULAR INTERVENTION Right 12/10/2020   Procedure: PERIPHERAL VASCULAR INTERVENTION;  Surgeon: Cherre Robins, MD;  Location: [facility];  Service: Cardiovascular;  Laterality: Right;  SFA   POSTERIOR FUSION CERVICAL SPINE  2012   PPM GENERATOR REMOVAL N/A 12/14/2020   Procedure: PPM GENERATOR REMOVAL;  Surgeon: Evans Lance, MD;  Location: [facility];   Service: Cardiovascular;  Laterality: N/A;   TEE WITHOUT CARDIOVERSION N/A 12/11/2020   Procedure: TRANSESOPHAGEAL ECHOCARDIOGRAM (TEE);  Surgeon: Geralynn Rile, MD;  Location: [facility];  Service: Cardiovascular;  Laterality: N/A;   TRIGGER FINGER RELEASE  2011   4th finger left hand   VIDEO ASSISTED THORACOSCOPY Left 12/26/2016   Procedure: VIDEO ASSISTED THORACOSCOPY;  Surgeon: [name], MD;  Location: [facility];  Service: Thoracic;  Laterality: Left;   VIDEO BRONCHOSCOPY N/A 12/26/2016   Procedure: VIDEO BRONCHOSCOPY;  Surgeon: [name], MD;  Location: [facility];  Service: Thoracic;  Laterality: N/A;    Allergies  Allergen Reactions   Codeine Hives and Itching   Ofev [Nintedanib] Diarrhea    SEVERE DIARRHEA   Pirfenidone Diarrhea and Other (See Comments)    Esbriet (Pirfenidone) causes elevated  LFTs. D/C on 06/14/17 and SEVERE DIARRHEA     Immunization History  Administered Date(s) Administered   Influenza Split 08/14/2017   Influenza, High Dose Seasonal PF 08/05/2016, 09/02/2019   Influenza-Unspecified 10/13/2020   PFIZER Comirnaty(Gray Top)Covid-19 Tri-Sucrose Vaccine 12/28/2019, 01/18/2020, 09/27/2020   PFIZER(Purple Top)SARS-COV-2 Vaccination 12/28/2019, 01/18/2020   Pneumococcal Conjugate-13 01/14/2015   Pneumococcal Polysaccharide-23 12/05/2014    Family History  Problem Relation Age of Onset   Diabetes Mellitus II Mother    Emphysema Father 49   Heart attack Father    Colon cancer Neg Hx    Esophageal cancer Neg Hx    Rectal cancer Neg Hx    Stomach cancer Neg Hx      Current Outpatient Medications:    acetaminophen (TYLENOL) 325 MG tablet, Take 1-2 tablets (325-650 mg total) by mouth every 6 (six) hours as needed for mild pain (pain score 1-3 or temp > 100.5)., Disp: , Rfl:    albuterol (VENTOLIN HFA) 108 (90 Base) MCG/ACT inhaler, Inhale 2 puffs into the lungs every 4 (four) hours as needed for wheezing or shortness of breath., Disp:  6.7 g, Rfl: 0   apixaban (ELIQUIS) 5 MG TABS tablet, Take 1 tablet (5 mg total) by mouth 2 (two) times daily., Disp: 60 tablet, Rfl: 11   Ascorbic Acid (VITAMIN C WITH ROSE HIPS) 500 MG tablet, Take 1 tablet (500 mg total) by mouth daily., Disp: 30 tablet, Rfl: 0   docusate sodium (COLACE) 100 MG capsule, Take 1 capsule (100 mg total) by mouth daily., Disp: 10 capsule, Rfl: 0   doxycycline (VIBRA-TABS) 100 MG tablet, Take 1 tablet (100 mg total) by mouth 2 (two) times daily., Disp: 60 tablet, Rfl: 0   escitalopram (LEXAPRO) 10 MG tablet, Take 1 tablet (10 mg total) by mouth daily., Disp: 30 tablet, Rfl: 0   furosemide (LASIX) 40 MG tablet, Take 1 tablet (40 mg total) by mouth daily., Disp: 30 tablet, Rfl: 0   gabapentin (NEURONTIN) 300 MG capsule, TAKE 1 CAPSULE BY MOUTH THREE TIMES A DAY, Disp: 180 capsule, Rfl: 3   insulin aspart (NOVOLOG FLEXPEN) 100 UNIT/ML FlexPen, Inject 6 Units into the skin 3 (three) times daily with meals., Disp: 15 mL, Rfl: 11   insulin glargine (LANTUS) 100 UNIT/ML Solostar Pen, Inject 8 Units into the skin at bedtime., Disp: 15 mL, Rfl: 11   iron polysaccharides (NIFEREX) 150 MG capsule, Take 1 capsule (150 mg total) by mouth daily with lunch., Disp: 30 capsule, Rfl: 0   LORazepam (ATIVAN) 1 MG tablet, Take 1 tablet (1 mg total) by mouth at bedtime., Disp: 30 tablet, Rfl: 0   metoprolol tartrate (LOPRESSOR) 50 MG tablet, Take 0.5 tablets (25 mg total) by mouth daily., Disp: 30 tablet, Rfl: 0   Multiple Vitamin (MULTIVITAMIN WITH MINERALS) TABS tablet, Take 1 tablet by mouth daily., Disp: , Rfl:    Olopatadine HCl (PATADAY OP), Place 1 drop into both eyes daily as needed (allergies)., Disp: , Rfl:    pantoprazole (PROTONIX) 40 MG tablet, Take 1 tablet (40 mg total) by mouth 2 (two) times daily., Disp: 60 tablet, Rfl: 0   polyethylene glycol powder (GLYCOLAX/MIRALAX) 17 GM/SCOOP powder, Take 17 g by mouth daily as needed for moderate constipation., Disp: , Rfl:     Probiotic Product (PROBIOTIC PO), Take 1 capsule by mouth daily., Disp: , Rfl:    STUDY - FIBROGEN - pamrevlumab 10 mg/mL IV infusion (open label) (PI-Jansen Sciuto), Inject 30 mg/kg into the vein every 21 ( twenty-one)  days. For Investigational Use Only. Infused every 21 days. For questions regarding this medication please contact [facility]., Disp: , Rfl:    traMADol (ULTRAM) 50 MG tablet, Take 1 tablet (50 mg total) by mouth every 8 (eight) hours as needed for severe pain or moderate pain., Disp: 30 tablet, Rfl: 0   Zinc Oxide (TRIPLE PASTE) 12.8 % ointment, Apply 1 application topically 2 (two) times daily. Apply to affected area, Disp: 56.7 g, Rfl: 0   zinc sulfate 220 (50 Zn) MG capsule, Take 1 capsule (220 mg total) by mouth daily., Disp: 30 capsule, Rfl: 0   atorvastatin (LIPITOR) 80 MG tablet, Take 1 tablet (80 mg total) by mouth daily., Disp: 90 tablet, Rfl: 3      Objective:   Vitals:   06/08/22 1107  BP: (!) 112/58  Pulse: 60  Temp: 98.4 F (36.9 C)  TempSrc: Oral  SpO2: 98%  Weight: 241 lb 9.6 oz (109.6 kg)  Height: 6' (1.829 m)    Estimated body mass index is 32.77 kg/m as calculated from the following:   Height as of this encounter: 6' (1.829 m).   Weight as of this encounter: 241 lb 9.6 oz (109.6 kg).  @WEIGHTCHANGE @  Filed Weights   06/08/22 1107  Weight: 241 lb 9.6 oz (109.6 kg)     Physical Exam   General: No distress. Looks well.  Sitting in wheelchair Neuro: Alert and Oriented x 3. GCS 15. Speech normal Psych: Pleasant Resp:  Barrel Chest - no.  Wheeze - no, Crackles -yes scattered, No overt respiratory distress CVS: Normal heart sounds. Murmurs - no Ext: Stigmata of Connective Tissue Disease - no.  Has prosthesis in the right lower extremity.  Left feet HEENT: Normal  SKIN: Psoriasis in the forearm. upper airway. PEERL +. No post nasal drip        Assessment:       ICD-10-CM   1. 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HCC)  Y40.347 Pulmonary  function test         Plan:     Patient Instructions     ICD-10-CM   1. 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[place])  J84.112       STable clinically Research drug Pamrevlumab did not work and now you are off study Unclear if psoriasis was related to study drug  - we will see when data is released Plan  - do spirometry and dlco in 2-3 months - continue supportive care - can consider for other clinical trials in fture  Followup - 2-3 months but after PFT- 30 min visit    SIGNATURE    Dr. Brand Males, M.D., F.C.C.P,  Pulmonary and Critical Care Medicine Staff Physician, [place] Director - Interstitial Lung Disease  Program  Pulmonary [place] [facility] [place], [place], [address]  Pager: [phone], If no answer or between  15:00h - 7:00h: call [phone] Telephone: [phone]  11:41 AM 06/08/2022

## 2022-06-08 NOTE — Patient Instructions (Signed)
ICD-10-CM   1. 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[place])  J84.112       STable clinically Research drug Pamrevlumab did not work and now you are off study Unclear if psoriasis was related to study drug  - we will see when data is released Plan  - do spirometry and dlco in 2-3 months - continue supportive care - can consider for other clinical trials in fture  Followup - 2-3 months but after PFT- 30 min visit

## 2022-06-09 ENCOUNTER — Encounter: Payer: Self-pay | Admitting: Physical Therapy

## 2022-06-09 ENCOUNTER — Ambulatory Visit (INDEPENDENT_AMBULATORY_CARE_PROVIDER_SITE_OTHER): Payer: Medicare Other | Admitting: Physical Therapy

## 2022-06-09 DIAGNOSIS — R2681 Unsteadiness on feet: Secondary | ICD-10-CM

## 2022-06-09 DIAGNOSIS — M6281 Muscle weakness (generalized): Secondary | ICD-10-CM

## 2022-06-09 DIAGNOSIS — R2689 Other abnormalities of gait and mobility: Secondary | ICD-10-CM | POA: Diagnosis not present

## 2022-06-09 NOTE — Therapy (Signed)
OUTPATIENT PHYSICAL THERAPY TREATMENT NOTE   Patient Name: Shane [name] MRN: [id number] DOB:Nov 12, 1941, 81 y.o., male Today's Date: 06/09/2022  PCP: Burnard Bunting, MD REFERRING PROVIDER: [name], MD  END OF SESSION:   PT End of Session - 06/09/22 1058     Visit Number 8    Number of Visits 26    Date for PT Re-Evaluation 08/04/22    Authorization Type UHC Medicare    Authorization Time Period [facility] MEDICARE  $20 COPAY    Progress Note Due on Visit 10    PT Start Time 1055    PT Stop Time 1140    PT Time Calculation (min) 45 min    Equipment Utilized During Treatment Gait belt    Activity Tolerance Patient tolerated treatment well;Patient limited by fatigue                 Past Medical History:  Diagnosis Date   Anxiety    AV block, Mobitz 1    Cataract    Chronic kidney disease    d/t DM   CKD (chronic kidney disease), stage III ([address])    Depression    Diabetes mellitus    Vgo disposal insulin bolus  simular to insulin pump   Dyspnea    GERD (gastroesophageal reflux disease)    History of kidney stones    passed   Hyperlipidemia    Hypertension    Idiopathic pulmonary fibrosis ([place]) 11/2016   ILD (interstitial lung disease) ([place])    Moderate aortic stenosis    a. 10/2019 Echo: EF 55-60%, Gr2 DD. Nl RV.    Neuromuscular disorder ([place])    Neuropathy associated with endocrine disorder ([place])    Nonobstructive CAD (coronary artery disease)    a. 2012 Cath: mod, nonobs dzs; b. 10/2016 MV: EF 60%, no ischemia.   OSA on CPAP 05/05/2017   Unattended Home Sleep Test 7/2/813-AHI 38.6/hour, desaturation to 64%, body weight 261 pounds   PONV (postoperative nausea and vomiting)    Sleep apnea     uses cpap asked to bring mask and tubing   Past Surgical History:  Procedure Laterality Date   ABDOMINAL AORTOGRAM W/LOWER EXTREMITY N/A 12/10/2020   Procedure: ABDOMINAL AORTOGRAM W/LOWER EXTREMITY;  Surgeon: Cherre Robins, MD;  Location: [place] [facility];   Service: Cardiovascular;  Laterality: N/A;   AMPUTATION Right 01/22/2021   Procedure: 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;  Surgeon: [name], MD;  Location: [place];  Service: Orthopedics;  Laterality: Right;   AMPUTATION Right 03/17/2021   Procedure: RIGHT BELOW KNEE AMPUTATION;  Surgeon: [name], MD;  Location: [place];  Service: Orthopedics;  Laterality: Right;   ANKLE FUSION Right 01/22/2021   Procedure: RIGHT FOOT TIBIOCALCANEAL FUSION;  Surgeon: [name], MD;  Location: [place];  Service: Orthopedics;  Laterality: Right;   ANTERIOR FUSION CERVICAL SPINE  2012   CARDIAC CATHETERIZATION  2011   CARDIAC CATHETERIZATION N/A 11/09/2016   Procedure: Right Heart Cath;  Surgeon: Belva Crome, MD;  Location: [place] [facility];  Service: Cardiovascular;  Laterality: N/A;   carpel tunnel     left wrist   CATARACT EXTRACTION     CATARACT EXTRACTION W/ INTRAOCULAR LENS  IMPLANT, BILATERAL  2013   CERVICAL LAMINECTOMY  2012   COLONOSCOPY N/A 01/14/2013   Procedure: COLONOSCOPY;  Surgeon: Irene Shipper, MD;  Location: [facility];  Service: Endoscopy;  Laterality: N/A;   CORONARY ARTERY BYPASS GRAFT  11/04/2020  LIMA-LAD, SVG-OM1, SVG-PDA (Dr [facility]) dc 11/18/2020   EYE SURGERY     I & D EXTREMITY Right 02/19/2021   Procedure: RIGHT ANKLE DEBRIDEMENT AND PLACEMENT ANTIBIOTIC BEADS;  Surgeon: [name], MD;  Location: [place];  Service: Orthopedics;  Laterality: Right;   KNEE SURGERY  1998   left   LEFT HEART CATH AND CORONARY ANGIOGRAPHY N/A 07/10/2020   Procedure: LEFT HEART CATH AND CORONARY ANGIOGRAPHY;  Surgeon: Sherren Mocha, MD;  Location: [place] [facility];  Service: Cardiovascular;  Laterality: N/A;   LUMBAR LAMINECTOMY  2003   LUNG BIOPSY Left 12/26/2016   Procedure: LUNG BIOPSY;  Surgeon: [name], MD;  Location: [place];  Service: Thoracic;  Laterality: Left;   PACEMAKER IMPLANT N/A 03/30/2020   Procedure: PACEMAKER IMPLANT;  Surgeon: Evans Lance, MD;   Location: [place] [facility];  Service: Cardiovascular;  Laterality: N/A;   PERIPHERAL VASCULAR INTERVENTION Right 12/10/2020   Procedure: PERIPHERAL VASCULAR INTERVENTION;  Surgeon: Cherre Robins, MD;  Location: [place] [facility];  Service: Cardiovascular;  Laterality: Right;  SFA   POSTERIOR FUSION CERVICAL SPINE  2012   PPM GENERATOR REMOVAL N/A 12/14/2020   Procedure: PPM GENERATOR REMOVAL;  Surgeon: Evans Lance, MD;  Location: [place] [facility];  Service: Cardiovascular;  Laterality: N/A;   TEE WITHOUT CARDIOVERSION N/A 12/11/2020   Procedure: TRANSESOPHAGEAL ECHOCARDIOGRAM (TEE);  Surgeon: Geralynn Rile, MD;  Location: [place];  Service: Cardiovascular;  Laterality: N/A;   TRIGGER FINGER RELEASE  2011   4th finger left hand   VIDEO ASSISTED THORACOSCOPY Left 12/26/2016   Procedure: VIDEO ASSISTED THORACOSCOPY;  Surgeon: [name], MD;  Location: [place];  Service: Thoracic;  Laterality: Left;   VIDEO BRONCHOSCOPY N/A 12/26/2016   Procedure: VIDEO BRONCHOSCOPY;  Surgeon: [name], MD;  Location: [place];  Service: Thoracic;  Laterality: N/A;   Patient Active Problem List   Diagnosis Date Noted   Intermediate stage nonexudative age-related macular degeneration of both eyes 04/25/2022   Research study patient 12/24/2021   Post-operative pain    CKD (chronic kidney disease), stage II    Labile blood glucose    Pulmonary fibrosis ([place])    Hyponatremia    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[place]) 03/26/2021   S/P BKA (below knee amputation) unilateral, right ([place])    Shortness of breath    Acute blood loss anemia    Stage 3 chronic kidney disease ([place])    Atrial fibrillation ([place])    Controlled type 2 diabetes mellitus with hyperglycemia, with long-term current use of insulin ([facility])    Postoperative pain    Postoperative hemorrhagic shock 03/20/2021   Atherosclerosis of native arteries of extremities with gangrene, right leg ([place])    Hardware complicating wound  infection ([place])    Abscess of bursa of right ankle 02/15/2021   Endocarditis 01/19/2021   Peripheral vascular disease ([place]) 12/29/2020   CHF (congestive heart failure), NYHA class II, chronic, diastolic ([place]) 36/64/4034   History of COVID-19 12/22/2020   SSS (sick sinus syndrome) ([place]) 12/22/2020   Acute bacterial endocarditis    MSSA bacteremia 12/09/2020   Diabetic foot ulcer ([place]) 12/09/2020   Foot drop, right foot    Generalized weakness 12/08/2020   Dehydration with hyponatremia 12/08/2020   Atrial fibrillation, chronic ([place]) 12/08/2020   Elevated troponin level not due myocardial infarction 12/08/2020   Adrenal insufficiency ([place]) 11/14/2020   Orthostatic hypotension 11/14/2020   Physical deconditioning 11/14/2020  Difficulty sleeping 11/07/2020   Postoperative anemia due to acute blood loss 11/07/2020   Right ventricular dysfunction 11/05/2020   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/04/2020   Hearing loss 11/03/2020   Cardiac device in situ 09/09/2020   Coronary atherosclerosis due to lipid rich plaque 09/02/2020   COVID-19 virus infection 07/18/2020   Coronary artery disease involving native heart without angina pectoris 07/10/2020   Chronic kidney disease, stage 3a ([place]) 03/29/2020   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/28/2020   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 ([place]) 03/16/2020   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 ([place]) 03/16/2020   Right epiretinal membrane 03/16/2020   Posterior vitreous detachment of right eye 03/16/2020   Aortic stenosis, moderate 03/12/2020   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   Near syncope 04/27/2018   Hypoglycemia due to insulin 01/06/2018   Essential hypertension 01/06/2018   Diabetes mellitus type 2, with complication, on long term insulin pump ([place]) 01/06/2018   Syncope and collapse 01/05/2018   Encounter for therapeutic drug monitoring  06/29/2017   OSA on CPAP 05/05/2017   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[place]) 01/05/2017   Abnormal chest x-ray 10/12/2016   Benign neoplasm of colon 01/14/2013    REFERRING DIAG: Z89.511 (ICD-10-CM) - Hx of right BKA  ONSET DATE: 05/04/2022 MD visit with release to WB on LLE  THERAPY DIAG:  Unsteadiness on feet  Other abnormalities of gait and mobility  Muscle weakness (generalized)  Rationale for Evaluation and Treatment Rehabilitation  PERTINENT HISTORY: CKD st III, AV block Mobitz, pacemaker, pulmonary fibrosis, CAD, CABG 21, HTN, A-Fib, DM2, PAD, right BKA, Left TMA  PRECAUTIONS: ICD/Pacemaker  SUBJECTIVE: He states not really having pain, He says he is supposed to get left foot orthosis next week either Monday or Tuesday.    PAIN:   Are you having pain?  No   OBJECTIVE: (objective measures completed at initial evaluation unless otherwise dated)  OBJECTIVE:    COGNITION: Overall cognitive status: Within functional limits for tasks assessed   POSTURE: 05/09/2022:  rounded shoulders, forward head, and flexed trunk    LOWER EXTREMITY ROM:   ROM P:passive  A:active Right Eval 05/09/2022 Left [place] 6/5/2023l  Hip flexion      Hip extension A: -25* standing A: -25* standing  Hip abduction      Hip adduction      Hip internal rotation      Hip external rotation      Knee flexion      Knee extension      Ankle dorsiflexion NA P: -17*  Ankle plantarflexion NA    Ankle inversion NA    Ankle eversion NA     (Blank rows = not tested)   LOWER EXTREMITY MMT:   MMT Right Eval 05/09/2022 Left Eval 05/09/2022  Hip flexion      Hip extension Functional testing 3-/5 Functional testing 3-/5  Hip abduction Functional testing 3-/5 Functional testing 3-/5  Hip adduction      Hip internal rotation      Hip external rotation      Knee flexion Functional testing 3-/5 Functional testing 3-/5  Knee extension Functional testing 3/5 Functional testing 3/5  Ankle dorsiflexion       Ankle plantarflexion      Ankle inversion      Ankle eversion      (Blank rows = not tested)   TRANSFERS: 05/09/2022:  Sit  to stand: SBA and requires use of armrests from 20" stable w/c & needs RW for stabilization 05/09/2022:  Stand to sit: SBA and requires use of armrests from 20" stable w/c & needs RW for stabilization   GAIT: 05/09/2022:  Gait pattern: step to pattern, decreased step length- Left, decreased stance time- Right, Right hip hike, antalgic, lateral hip instability, trunk flexed, and wide BOS Distance walked: 25' Assistive device utilized: Environmental consultant - 2 wheeled and TTA prosthesis RLE Level of assistance: Min A Comments: pt reports dizziness with turning in gait   FUNCTIONAL TESTs:  Berg Balance Scale: 05/09/2022:  9/56 & tasks of Berg with RW support 27/56    [facility] PT Assessment - 05/09/22 0930                Berg Balance Test    Sit to Stand Needs moderate or maximal assist to stand   task with RW support = 3    Standing Unsupported Unable to stand 30 seconds unassisted   task with RW support = 3    Sitting with Back Unsupported but Feet Supported on Floor or Stool Able to sit safely and securely 2 minutes     Stand to Sit Uses backs of legs against chair to control descent   task with RW support = 3    Transfers Able to transfer safely, definite need of hands     Standing Unsupported with Eyes Closed Needs help to keep from falling   task with RW support = 3    Standing Unsupported with Feet Together Needs help to attain position and unable to hold for 15 seconds   task with RW support = 2    From Standing, Reach Forward with Outstretched Arm Loses balance while trying/requires external support   task with RW support = 1    From Standing Position, Pick up Object from Floor Unable to try/needs assist to keep balance   task with RW support = 2    From Standing Position, Turn to Look Behind Over each Shoulder Needs assist to keep from losing balance and falling   task with RW  support = 1    Turn 360 Degrees Needs assistance while turning   task with RW support = 0    Standing Unsupported, Alternately Place Feet on Step/Stool Needs assistance to keep from falling or unable to try   task with RW support = 0    Standing Unsupported, One Foot in [employer] balance while stepping or standing   task with RW support = 1    Standing on One Leg Unable to try or needs assist to prevent fall   task with RW support = 1    Total Score 9     Berg comment: tasks of Berg with RW support 27/56                     CURRENT PROSTHETIC WEAR ASSESSMENT: 05/09/2022:   Patient is dependent with: skin check, residual limb care, care of non-amputated limb, prosthetic cleaning, ply sock cleaning, and correct ply sock adjustment Donning prosthesis: Modified independence Doffing prosthesis: Modified independence Prosthetic wear tolerance: >90% of awake hours/day, 7 days/week Prosthetic weight bearing tolerance: 5 minutes Edema: LLE pitting edema with >10 sec capillary refill Residual limb condition: Right BKA limb has no openings, normal temperature & moisture.  Redness patella & patella tendon from friction of liner with knee flexed. Cylinderical shape.  LLE TMA pt's wife showed picture of  wound on dorsum of foot that is being managed by PCP.   Prosthetic description: silicon liner with pin lock suspension, total contact socket, dynamic response foot.      TODAY'S TREATMENT:  06/09/2022 PT using 1/2" lift on LLE for activities.  PT limiting gait until he has AFO / partial foot prosthesis on LLE to avoid skin issues Therapeutic Exercise: Leg Press back 45* BLEs 118# 25 reps 1 sets  single LE 56# 10 reps 2 sets ea HR 60 SpO2 99%;  Back flat LEs 112# 20 reps  1 set  single LE 56# 10 reps 2 sets ea LE HR  Hamstring stretch seated with strap BLEs 30 sec 3 reps ea LE PT assist Hip flexor stretch BLEs 1st rep hooklying & 2nd rep knee to chest supine dropping LE over edge 30 sec hold 2 reps ea  LE  Neuromuscular Re-ed Standing at sink performing weight shifts X10 lateral and X10 A-P Standing at sink with one UE support and moving cones lateral with other 3 cones X 4 each reaching across midline to otherside Standing at sink alternate UE reaches into overhead cabinet X10 bilat Standing at sink with trunk rotations to look over each shoulder X10 bilat, HR 60, SPO2 95%   06/02/2022 PT using 1/2" lift on LLE for activities.  PT limiting gait until he has AFO / partial foot prosthesis on LLE to avoid skin issues Therapeutic Exercise: Leg Press back 45* BLEs 118# 20 reps 1 sets  single LE 56# 10 reps 2 sets ea HR 125 SpO2 97%;  Back flat LEs 112# 20 reps  1 set  single LE 56# 10 reps 2 sets ea LE HR 123 SpO2 95%  Hamstring stretch seated with strap BLEs 30 sec 2 reps ea LE PT assist Hip flexor stretch BLEs 1st rep hooklying & 2nd rep knee to chest supine dropping LE over edge 30 sec hold 2 reps ea LE  Neuromuscular Re-ed Standing in //bars with BUE support LLE on foam pad for height difference & protection:  PT demo & verbal cues on technique and mirror for visual feedback & upright posture.   Stepping ea LE forward to initial contact position & back to terminal stance 10 reps ea LE.   Stepping ea LE abduction to adduction alternating ant & post of contralateral LE 10 reps ea LE. HR 133 SpO2 95%  05/30/2022 PT using 1/2" lift on LLE for activities.  PT limiting gait until he has AFO / partial foot prosthesis on LLE to avoid skin issues Therapeutic Exercise: Leg Press back 45* BLEs 112# 20 reps 1 sets  single LE 56# 20 reps ea HR 120 SpO2 97%;  Back flat LEs 106# 20 reps  1 set  single LE 56# 10 reps 2 sets ea LE HR 119 SpO2 96%  Hamstring stretch seated with strap BLEs 30 sec 2 reps ea LE PT assist Hip flexor stretch BLEs 1st rep hooklying & 2nd rep knee to chest supine dropping LE over edge 30 sec hold 2 reps ea LE  Neuromuscular Re-ed Standing in //bars with BUE support (cues to  lighten weight bearing) feet hip width & 1/2" lift under LLE: head turns right/left, up/down & diagonals. 1)on floor eyes open with tactile cues.  2)on floor eyes closed with light UE support with tactile cues  3)on foam eyes open with light UE support on outside of handles of RW with tactile cues & MinA.  Repeated 1 & 2 seated on 24" bar  stool without UE support.   HR 132 SpO2 96%  seated rest for 2 min until HR returned to safe level <100 bpm.   05/26/2022 PT using 1/2" lift on LLE for activities.  PT limiting gait until he has AFO / partial foot prosthesis on LLE to avoid skin issues Therapeutic Exercise: Leg Press back 45* BLEs 106# 15 reps 1 sets HR84 SpO2 98%  single LE 50# 15 reps ea - LEs 100# 15 reps  sets HR87 SpO2 97%  single LE 50# 15 reps ea B2nd set back flat Hamstring stretch seated with strap BLEs 30 sec 2 reps ea LE PT cues Hip flexor stretch BLEs 1st rep hooklying & 2nd rep knee to chest supine dropping LE over edge 30 sec hold 2 reps ea LE  Neuromuscular Re-ed Standing in corner with RW in front feet hip width & 1/2" lift under LLE: head turns right/left, up/down & diagonals. 1)on floor eyes open with light tactile cues.  2)on floor eyes closed with light UE support on outside of handles of RW with tactile cues  3)on foam eyes open with light UE support on outside of handles of RW with tactile cues & MinA. PT explained balance systems & rationale for exercises.  Pt's grandson observed PT session. Pt & grandson verbalized understanding of balance activities.      05/11/22 1636  PT Education  Education Details HEP at sink & sitting on bar stool to develop back strength / endurance  Person(s) Educated Patient;Spouse  Methods Explanation;Demonstration;Tactile cues;Verbal cues;Handout  Comprehension Verbalized understanding;Returned demonstration;Verbal cues required;Tactile cues required;Need further instruction   Neuromuscular Reed-  cues on proprioception RLE using residual  limb, balance & standing tolerance.   Do each exercise 1-2  times per day Do each exercise 5-10 repetitions Hold each exercise for 2 seconds to feel your location  AT [place].  Try to find this position when standing still for activities.   USE TAPE ON FLOOR TO MARK THE MIDLINE POSITION which is even with middle of sink.  You also should try to feel with your limb pressure in socket.  You are trying to feel with limb what you used to feel with the bottom of your foot.  Side to Side Shift: Moving your hips only (not shoulders): move weight onto your left leg, HOLD/FEEL pressure in socket.  Move back to equal weight on each leg, HOLD/FEEL pressure in socket. Move weight onto your right leg, HOLD/FEEL pressure in socket. Move back to equal weight on each leg, HOLD/FEEL pressure in socket. Repeat.  Start with both hands on sink, progress to hand on prosthetic side only, then no hands.  Front to Back Shift: Moving your hips only (not shoulders): move your weight forward onto your toes, HOLD/FEEL pressure in socket. Move your weight back to equal Flat Foot on both legs, HOLD/FEEL  pressure in socket. Move your weight back onto your heels, HOLD/FEEL  pressure in socket. Move your weight back to equal on both legs, HOLD/FEEL  pressure in socket. Repeat.  Start with both hands on sink, progress to hand on prosthetic side only, then no hands.  Moving Cones / Cups: With equal weight on each leg: Hold on with one hand the first time, then progress to no hand supports. Move cups from one side of sink to the other. Place cups ~2" out of your reach, progress to 10" beyond reach.  Place one hand in  middle of sink and reach with other hand. Do both arms.  Then hover one hand and move cups with other hand.  Overhead/Upward Reaching: alternated reaching up to top cabinets or ceiling if no cabinets present. Keep equal weight on each leg. Start with one  hand support on counter while other hand reaches and progress to no hand support with reaching.  ace one hand in middle of sink and reach with other hand. Do both arms.  Then hover one hand and move cups with other hand.  5.   Looking Over Shoulders: With equal weight on each leg: alternate turning to look over your shoulders with one hand support on counter as needed.  Start with head motions only to look in front of shoulder, then even with shoulder and progress to looking behind you. To look to side, move head /eyes, then shoulder on side looking pulls back, shift more weight to side looking and pull hip back. Place one hand in middle of sink and let go with other hand so your shoulder can pull back. Switch hands to look other way.   Then hover one hand and look over shoulder. If looking right, use left hand at sink. If looking left, use right hand at sink. 6.  Alternate Tapping Toes or Stepping to lower shelf of cabinet  Move items under cabinet out of your way. Shift your hips/pelvis so weight on stance leg & Tighten muscles in hip.  SLOWLY step other leg so front of foot is in cabinet. Then step back to floor. Repeat with other leg.      ASSESSMENT:   CLINICAL IMPRESSION: SP02 levels and HR looked great with exercises today. He does have significant fatigue and tires easily which we will continue to work to improve with PT as tolerated.  OBJECTIVE IMPAIRMENTS Abnormal gait, cardiopulmonary status limiting activity, decreased activity tolerance, decreased balance, decreased endurance, decreased knowledge of use of DME, decreased mobility, decreased ROM, decreased strength, dizziness, increased edema, impaired flexibility, postural dysfunction, prosthetic dependency , obesity, and pain.    ACTIVITY LIMITATIONS carrying, lifting, bending, standing, stairs, transfers, and locomotion level   PARTICIPATION LIMITATIONS: driving, community activity, and yard work   [place], Time since  onset of injury/illness/exacerbation, and 3+ comorbidities: see PMH  are also affecting patient's functional outcome.    REHAB POTENTIAL: Good   CLINICAL DECISION MAKING: Evolving/moderate complexity   EVALUATION COMPLEXITY: Moderate     GOALS: Goals reviewed with patient? Yes   SHORT TERM GOALS: Target date: 06/08/2022   Patient demonstrates understanding of initial HEP.  Baseline: SEE OBJECTIVE DATA Goal status: reviewed again today 06/09/22 and he showed good understanding 06/09/22 2. Patient able to reach 7" and look over both shoulders with RW support with supervision. Baseline: SEE OBJECTIVE DATA Goal status: INITIAL   3. Patient ambulates 37' with RW & prosthesis with supervision. Baseline: SEE OBJECTIVE DATA Goal status: not met as we are limiting his gait until he receives AFO, 06/09/22     LONG TERM GOALS: Target date: 08/04/2022   Patient demonstrates & verbalized understanding of prosthetic care to enable safe utilization of prosthesis. Baseline: SEE OBJECTIVE DATA Goal status: INITIAL   Patient tolerates right prosthesis & left AFO / partial foot prosthesis wear >90% of awake hours without skin or limb pain issues. Baseline: SEE OBJECTIVE DATA Goal status: INITIAL   Berg Balance >/= 20/56 and tasks of Berg with RW support >/= 45/56 to indicate lower fall risk with standing ADLs Baseline:  SEE OBJECTIVE DATA Goal status: INITIAL   Patient ambulates >300' with prosthesis & LRAD modified independent Baseline: SEE OBJECTIVE DATA Goal status: INITIAL   Patient negotiates ramps, curbs & stairs with single rail with prosthesis & LRAD modified independent. Baseline: SEE OBJECTIVE DATA Goal status: INITIAL     PLAN: PT FREQUENCY: 2x/week   PT DURATION: 13 weeks (90 days)   PLANNED INTERVENTIONS: Therapeutic exercises, Therapeutic activity, Neuromuscular re-education, Balance training, Gait training, Patient/Family education, Stair training, Vestibular training,  [place] repositioning, Orthotic/Fit training, Prosthetic training, DME instructions, and physical performance testing   PLAN FOR NEXT SESSION:   continue to monitor HR & SpO2 with activities, manual therapy & exercises for flexibility, leg press, standing balance activities with 1/2" lift under LLE until he gets shoe / AFO   Debbe Odea, PT, DPT 06/09/2022, 11:51 AM

## 2022-06-10 ENCOUNTER — Ambulatory Visit: Payer: Medicare Other

## 2022-06-13 ENCOUNTER — Ambulatory Visit (INDEPENDENT_AMBULATORY_CARE_PROVIDER_SITE_OTHER): Payer: Medicare Other | Admitting: Physical Therapy

## 2022-06-13 ENCOUNTER — Encounter: Payer: Self-pay | Admitting: Physical Therapy

## 2022-06-13 DIAGNOSIS — R2681 Unsteadiness on feet: Secondary | ICD-10-CM | POA: Diagnosis not present

## 2022-06-13 DIAGNOSIS — R2689 Other abnormalities of gait and mobility: Secondary | ICD-10-CM

## 2022-06-13 DIAGNOSIS — Z9181 History of falling: Secondary | ICD-10-CM

## 2022-06-13 DIAGNOSIS — M6281 Muscle weakness (generalized): Secondary | ICD-10-CM | POA: Diagnosis not present

## 2022-06-13 DIAGNOSIS — R293 Abnormal posture: Secondary | ICD-10-CM

## 2022-06-13 NOTE — Therapy (Signed)
OUTPATIENT PHYSICAL THERAPY TREATMENT NOTE   Patient Name: Aniel Hubble MRN: [id number] DOB:February 19, 1941, 81 y.o., male Today's Date: 06/13/2022  PCP: Burnard Bunting, MD REFERRING PROVIDER: [name], MD  END OF SESSION:   PT End of Session - 06/13/22 0810     Visit Number 9    Number of Visits 26    Date for PT Re-Evaluation 08/04/22    Authorization Type UHC Medicare    Authorization Time Period [facility] MEDICARE  $20 COPAY    Progress Note Due on Visit 10    PT Start Time 0800    PT Stop Time 0845    PT Time Calculation (min) 45 min    Equipment Utilized During Treatment Gait belt    Activity Tolerance Patient tolerated treatment well;Patient limited by fatigue                  Past Medical History:  Diagnosis Date   Anxiety    AV block, Mobitz 1    Cataract    Chronic kidney disease    d/t DM   CKD (chronic kidney disease), stage III ([place])    Depression    Diabetes mellitus    Vgo disposal insulin bolus  simular to insulin pump   Dyspnea    GERD (gastroesophageal reflux disease)    History of kidney stones    passed   Hyperlipidemia    Hypertension    Idiopathic pulmonary fibrosis ([place]) 11/2016   ILD (interstitial lung disease) ([place])    Moderate aortic stenosis    a. 10/2019 Echo: EF 55-60%, Gr2 DD. Nl RV.    Neuromuscular disorder ([place])    Neuropathy associated with endocrine disorder ([place])    Nonobstructive CAD (coronary artery disease)    a. 2012 Cath: mod, nonobs dzs; b. 10/2016 MV: EF 60%, no ischemia.   OSA on CPAP 05/05/2017   Unattended Home Sleep Test 7/2/813-AHI 38.6/hour, desaturation to 64%, body weight 261 pounds   PONV (postoperative nausea and vomiting)    Sleep apnea     uses cpap asked to bring mask and tubing   Past Surgical History:  Procedure Laterality Date   ABDOMINAL AORTOGRAM W/LOWER EXTREMITY N/A 12/10/2020   Procedure: ABDOMINAL AORTOGRAM W/LOWER EXTREMITY;  Surgeon: Cherre Robins, MD;  Location: [place] [facility];  Service: Cardiovascular;  Laterality: N/A;   AMPUTATION Right 01/22/2021   Procedure: 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;  Surgeon: [name], MD;  Location: [place];  Service: Orthopedics;  Laterality: Right;   AMPUTATION Right 03/17/2021   Procedure: RIGHT BELOW KNEE AMPUTATION;  Surgeon: [name], MD;  Location: [place];  Service: Orthopedics;  Laterality: Right;   ANKLE FUSION Right 01/22/2021   Procedure: RIGHT FOOT TIBIOCALCANEAL FUSION;  Surgeon: [name], MD;  Location: [place];  Service: Orthopedics;  Laterality: Right;   ANTERIOR FUSION CERVICAL SPINE  2012   CARDIAC CATHETERIZATION  2011   CARDIAC CATHETERIZATION N/A 11/09/2016   Procedure: Right Heart Cath;  Surgeon: Belva Crome, MD;  Location: [place] [facility];  Service: Cardiovascular;  Laterality: N/A;   carpel tunnel     left wrist   CATARACT EXTRACTION     CATARACT EXTRACTION W/ INTRAOCULAR LENS  IMPLANT, BILATERAL  2013   CERVICAL LAMINECTOMY  2012   COLONOSCOPY N/A 01/14/2013   Procedure: COLONOSCOPY;  Surgeon: Irene Shipper, MD;  Location: [facility];  Service: Endoscopy;  Laterality: N/A;   CORONARY ARTERY BYPASS GRAFT  11/04/2020  LIMA-LAD, SVG-OM1, SVG-PDA (Dr [facility]) dc 11/18/2020   EYE SURGERY     I & D EXTREMITY Right 02/19/2021   Procedure: RIGHT ANKLE DEBRIDEMENT AND PLACEMENT ANTIBIOTIC BEADS;  Surgeon: [name], MD;  Location: [place];  Service: Orthopedics;  Laterality: Right;   KNEE SURGERY  1998   left   LEFT HEART CATH AND CORONARY ANGIOGRAPHY N/A 07/10/2020   Procedure: LEFT HEART CATH AND CORONARY ANGIOGRAPHY;  Surgeon: Sherren Mocha, MD;  Location: [place] [facility];  Service: Cardiovascular;  Laterality: N/A;   LUMBAR LAMINECTOMY  2003   LUNG BIOPSY Left 12/26/2016   Procedure: LUNG BIOPSY;  Surgeon: [name], MD;  Location: [place];  Service: Thoracic;  Laterality: Left;   PACEMAKER IMPLANT N/A 03/30/2020   Procedure: PACEMAKER IMPLANT;  Surgeon: Evans Lance, MD;   Location: [place] [facility];  Service: Cardiovascular;  Laterality: N/A;   PERIPHERAL VASCULAR INTERVENTION Right 12/10/2020   Procedure: PERIPHERAL VASCULAR INTERVENTION;  Surgeon: Cherre Robins, MD;  Location: [place] [facility];  Service: Cardiovascular;  Laterality: Right;  SFA   POSTERIOR FUSION CERVICAL SPINE  2012   PPM GENERATOR REMOVAL N/A 12/14/2020   Procedure: PPM GENERATOR REMOVAL;  Surgeon: Evans Lance, MD;  Location: [place] [facility];  Service: Cardiovascular;  Laterality: N/A;   TEE WITHOUT CARDIOVERSION N/A 12/11/2020   Procedure: TRANSESOPHAGEAL ECHOCARDIOGRAM (TEE);  Surgeon: Geralynn Rile, MD;  Location: [place];  Service: Cardiovascular;  Laterality: N/A;   TRIGGER FINGER RELEASE  2011   4th finger left hand   VIDEO ASSISTED THORACOSCOPY Left 12/26/2016   Procedure: VIDEO ASSISTED THORACOSCOPY;  Surgeon: [name], MD;  Location: [place];  Service: Thoracic;  Laterality: Left;   VIDEO BRONCHOSCOPY N/A 12/26/2016   Procedure: VIDEO BRONCHOSCOPY;  Surgeon: [name], MD;  Location: [place];  Service: Thoracic;  Laterality: N/A;   Patient Active Problem List   Diagnosis Date Noted   Intermediate stage nonexudative age-related macular degeneration of both eyes 04/25/2022   Research study patient 12/24/2021   Post-operative pain    CKD (chronic kidney disease), stage II    Labile blood glucose    Pulmonary fibrosis ([place])    Hyponatremia    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[place]) 03/26/2021   S/P BKA (below knee amputation) unilateral, right ([place])    Shortness of breath    Acute blood loss anemia    Stage 3 chronic kidney disease ([place])    Atrial fibrillation ([place])    Controlled type 2 diabetes mellitus with hyperglycemia, with long-term current use of insulin (H[facility])    Postoperative pain    Postoperative hemorrhagic shock 03/20/2021   Atherosclerosis of native arteries of extremities with gangrene, right leg ([place])    Hardware complicating wound  infection ([place])    Abscess of bursa of right ankle 02/15/2021   Endocarditis 01/19/2021   Peripheral vascular disease ([place]) 12/29/2020   CHF (congestive heart failure), NYHA class II, chronic, diastolic ([place]) 36/64/4034   History of COVID-19 12/22/2020   SSS (sick sinus syndrome) ([place]) 12/22/2020   Acute bacterial endocarditis    MSSA bacteremia 12/09/2020   Diabetic foot ulcer ([place]) 12/09/2020   Foot drop, right foot    Generalized weakness 12/08/2020   Dehydration with hyponatremia 12/08/2020   Atrial fibrillation, chronic ([place]) 12/08/2020   Elevated troponin level not due myocardial infarction 12/08/2020   Adrenal insufficiency ([place]) 11/14/2020   Orthostatic hypotension 11/14/2020   Physical deconditioning 11/14/2020  Difficulty sleeping 11/07/2020   Postoperative anemia due to acute blood loss 11/07/2020   Right ventricular dysfunction 11/05/2020   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/04/2020   Hearing loss 11/03/2020   Cardiac device in situ 09/09/2020   Coronary atherosclerosis due to lipid rich plaque 09/02/2020   COVID-19 virus infection 07/18/2020   Coronary artery disease involving native heart without angina pectoris 07/10/2020   Chronic kidney disease, stage 3a ([place]) 03/29/2020   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/28/2020   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 ([place]) 03/16/2020   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 ([place]) 03/16/2020   Right epiretinal membrane 03/16/2020   Posterior vitreous detachment of right eye 03/16/2020   Aortic stenosis, moderate 03/12/2020   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   Near syncope 04/27/2018   Hypoglycemia due to insulin 01/06/2018   Essential hypertension 01/06/2018   Diabetes mellitus type 2, with complication, on long term insulin pump ([place]) 01/06/2018   Syncope and collapse 01/05/2018   Encounter for therapeutic drug monitoring  06/29/2017   OSA on CPAP 05/05/2017   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[place]) 01/05/2017   Abnormal chest x-ray 10/12/2016   Benign neoplasm of colon 01/14/2013    REFERRING DIAG: Z89.511 (ICD-10-CM) - Hx of right BKA  ONSET DATE: 05/04/2022 MD visit with release to WB on LLE  THERAPY DIAG:  Unsteadiness on feet  Other abnormalities of gait and mobility  Muscle weakness (generalized)  History of falling  Abnormal posture  Rationale for Evaluation and Treatment Rehabilitation  PERTINENT HISTORY: CKD st III, AV block Mobitz, pacemaker, pulmonary fibrosis, CAD, CABG 21, HTN, A-Fib, DM2, PAD, right BKA, Left TMA  PRECAUTIONS: ICD/Pacemaker  SUBJECTIVE: He sees CPO today for next appt about left AFO /  partial foot prosthesis. He feels his wound on left foot continues to heal.   PAIN:   Are you having pain?  No   OBJECTIVE: (objective measures completed at initial evaluation unless otherwise dated)  OBJECTIVE:    COGNITION: Overall cognitive status: Within functional limits for tasks assessed   POSTURE: 05/09/2022:  rounded shoulders, forward head, and flexed trunk    LOWER EXTREMITY ROM:   ROM P:passive  A:active Right Eval 05/09/2022 Left [place] 6/5/2023l  Hip flexion      Hip extension A: -25* standing A: -25* standing  Hip abduction      Hip adduction      Hip internal rotation      Hip external rotation      Knee flexion      Knee extension      Ankle dorsiflexion NA P: -17*  Ankle plantarflexion NA    Ankle inversion NA    Ankle eversion NA     (Blank rows = not tested)   LOWER EXTREMITY MMT:   MMT Right Eval 05/09/2022 Left Eval 05/09/2022  Hip flexion      Hip extension Functional testing 3-/5 Functional testing 3-/5  Hip abduction Functional testing 3-/5 Functional testing 3-/5  Hip adduction      Hip internal rotation      Hip external rotation      Knee flexion Functional testing 3-/5 Functional testing 3-/5  Knee extension Functional testing  3/5 Functional testing 3/5  Ankle dorsiflexion      Ankle plantarflexion      Ankle inversion      Ankle eversion      (Blank rows =  not tested)   TRANSFERS: 05/09/2022:  Sit to stand: SBA and requires use of armrests from 20" stable w/c & needs RW for stabilization 05/09/2022:  Stand to sit: SBA and requires use of armrests from 20" stable w/c & needs RW for stabilization   GAIT: 05/09/2022:  Gait pattern: step to pattern, decreased step length- Left, decreased stance time- Right, Right hip hike, antalgic, lateral hip instability, trunk flexed, and wide BOS Distance walked: 25' Assistive device utilized: Environmental consultant - 2 wheeled and TTA prosthesis RLE Level of assistance: Min A Comments: pt reports dizziness with turning in gait   FUNCTIONAL TESTs:  Berg Balance Scale: 05/09/2022:  9/56 & tasks of Berg with RW support 27/56    [facility] PT Assessment - 05/09/22 0930                Berg Balance Test    Sit to Stand Needs moderate or maximal assist to stand   task with RW support = 3    Standing Unsupported Unable to stand 30 seconds unassisted   task with RW support = 3    Sitting with Back Unsupported but Feet Supported on Floor or Stool Able to sit safely and securely 2 minutes     Stand to Sit Uses backs of legs against chair to control descent   task with RW support = 3    Transfers Able to transfer safely, definite need of hands     Standing Unsupported with Eyes Closed Needs help to keep from falling   task with RW support = 3    Standing Unsupported with Feet Together Needs help to attain position and unable to hold for 15 seconds   task with RW support = 2    From Standing, Reach Forward with Outstretched Arm Loses balance while trying/requires external support   task with RW support = 1    From Standing Position, Pick up Object from Floor Unable to try/needs assist to keep balance   task with RW support = 2    From Standing Position, Turn to Look Behind Over each Shoulder Needs assist to  keep from losing balance and falling   task with RW support = 1    Turn 360 Degrees Needs assistance while turning   task with RW support = 0    Standing Unsupported, Alternately Place Feet on Step/Stool Needs assistance to keep from falling or unable to try   task with RW support = 0    Standing Unsupported, One Foot in [employer] balance while stepping or standing   task with RW support = 1    Standing on One Leg Unable to try or needs assist to prevent fall   task with RW support = 1    Total Score 9     Berg comment: tasks of Berg with RW support 27/56                     CURRENT PROSTHETIC WEAR ASSESSMENT: 05/09/2022:   Patient is dependent with: skin check, residual limb care, care of non-amputated limb, prosthetic cleaning, ply sock cleaning, and correct ply sock adjustment Donning prosthesis: Modified independence Doffing prosthesis: Modified independence Prosthetic wear tolerance: >90% of awake hours/day, 7 days/week Prosthetic weight bearing tolerance: 5 minutes Edema: LLE pitting edema with >10 sec capillary refill Residual limb condition: Right BKA limb has no openings, normal temperature & moisture.  Redness patella & patella tendon from friction of liner with knee flexed. Cylinderical shape.  LLE TMA pt's wife showed picture of wound on dorsum of foot that is being managed by PCP.   Prosthetic description: silicon liner with pin lock suspension, total contact socket, dynamic response foot.      TODAY'S TREATMENT:  06/13/2022 Resting HR72 SpO2 97% PT using 1/2" lift on LLE for activities.  PT limiting gait until he has AFO / partial foot prosthesis on LLE to avoid skin issues Therapeutic Exercise: Leg Press back 45* BLEs 125# 25 reps 1 sets  single LE 62# 10 reps 2 sets ea HR 83 SpO2 99%;  Back flat LEs 118# 20 reps  1 set  single LE 56# 10 reps 2 sets ea LE HR 101 SpO2 97% Hamstring stretch seated with strap BLEs 30 sec 3 reps ea LE PT assist RLE Gastro stretch standing  step with heel depression 30 sec hold 2 reps PT demo & verbal cues on technique & rational  Neuromuscular Re-ed Standing in //bars crossways on foam beam with feet hip width apart and 1/2" lift under LLE to level pelvis -static: eyes open no UE support 5 x 10s CGA -horizontal and vertical head turns x5 min to mod assist, HR 85 SpO2 100% -diagonal head turns x5 each direction with verbal cues min to mod assist, HR 129 SpO2 96%  06/09/2022 PT using 1/2" lift on LLE for activities.  PT limiting gait until he has AFO / partial foot prosthesis on LLE to avoid skin issues Therapeutic Exercise: Leg Press back 45* BLEs 118# 25 reps 1 sets  single LE 56# 10 reps 2 sets ea HR 60 SpO2 99%;  Back flat LEs 112# 20 reps  1 set  single LE 56# 10 reps 2 sets ea LE HR  Hamstring stretch seated with strap BLEs 30 sec 3 reps ea LE PT assist Hip flexor stretch BLEs 1st rep hooklying & 2nd rep knee to chest supine dropping LE over edge 30 sec hold 2 reps ea LE  Neuromuscular Re-ed Standing at sink performing weight shifts X10 lateral and X10 A-P Standing at sink with one UE support and moving cones lateral with other 3 cones X 4 each reaching across midline to otherside Standing at sink alternate UE reaches into overhead cabinet X10 bilat Standing at sink with trunk rotations to look over each shoulder X10 bilat, HR 60, SPO2 95%   06/02/2022 PT using 1/2" lift on LLE for activities.  PT limiting gait until he has AFO / partial foot prosthesis on LLE to avoid skin issues Therapeutic Exercise: Leg Press back 45* BLEs 118# 20 reps 1 sets  single LE 56# 10 reps 2 sets ea HR 125 SpO2 97%;  Back flat LEs 112# 20 reps  1 set  single LE 56# 10 reps 2 sets ea LE HR 123 SpO2 95%  Hamstring stretch seated with strap BLEs 30 sec 2 reps ea LE PT assist Hip flexor stretch BLEs 1st rep hooklying & 2nd rep knee to chest supine dropping LE over edge 30 sec hold 2 reps ea LE  Neuromuscular Re-ed Standing in //bars with BUE  support LLE on foam pad for height difference & protection:  PT demo & verbal cues on technique and mirror for visual feedback & upright posture.   Stepping ea LE forward to initial contact position & back to terminal stance 10 reps ea LE.   Stepping ea LE abduction to adduction alternating ant & post of contralateral LE 10 reps ea LE. HR 133 SpO2 95%  05/30/2022 PT  using 1/2" lift on LLE for activities.  PT limiting gait until he has AFO / partial foot prosthesis on LLE to avoid skin issues Therapeutic Exercise: Leg Press back 45* BLEs 112# 20 reps 1 sets  single LE 56# 20 reps ea HR 120 SpO2 97%;  Back flat LEs 106# 20 reps  1 set  single LE 56# 10 reps 2 sets ea LE HR 119 SpO2 96%  Hamstring stretch seated with strap BLEs 30 sec 2 reps ea LE PT assist Hip flexor stretch BLEs 1st rep hooklying & 2nd rep knee to chest supine dropping LE over edge 30 sec hold 2 reps ea LE  Neuromuscular Re-ed Standing in //bars with BUE support (cues to lighten weight bearing) feet hip width & 1/2" lift under LLE: head turns right/left, up/down & diagonals. 1)on floor eyes open with tactile cues.  2)on floor eyes closed with light UE support with tactile cues  3)on foam eyes open with light UE support on outside of handles of RW with tactile cues & MinA.  Repeated 1 & 2 seated on 24" bar stool without UE support.   HR 132 SpO2 96%  seated rest for 2 min until HR returned to safe level <100 bpm.     05/11/22 1636  PT Education  Education Details HEP at sink & sitting on bar stool to develop back strength / endurance  Person(s) Educated Patient;Spouse  Methods Explanation;Demonstration;Tactile cues;Verbal cues;Handout  Comprehension Verbalized understanding;Returned demonstration;Verbal cues required;Tactile cues required;Need further instruction   Neuromuscular Reed-  cues on proprioception RLE using residual limb, balance & standing tolerance.   Do each exercise 1-2  times per day Do each exercise 5-10  repetitions Hold each exercise for 2 seconds to feel your location  AT [place].  Try to find this position when standing still for activities.   USE TAPE ON FLOOR TO MARK THE MIDLINE POSITION which is even with middle of sink.  You also should try to feel with your limb pressure in socket.  You are trying to feel with limb what you used to feel with the bottom of your foot.  Side to Side Shift: Moving your hips only (not shoulders): move weight onto your left leg, HOLD/FEEL pressure in socket.  Move back to equal weight on each leg, HOLD/FEEL pressure in socket. Move weight onto your right leg, HOLD/FEEL pressure in socket. Move back to equal weight on each leg, HOLD/FEEL pressure in socket. Repeat.  Start with both hands on sink, progress to hand on prosthetic side only, then no hands.  Front to Back Shift: Moving your hips only (not shoulders): move your weight forward onto your toes, HOLD/FEEL pressure in socket. Move your weight back to equal Flat Foot on both legs, HOLD/FEEL  pressure in socket. Move your weight back onto your heels, HOLD/FEEL  pressure in socket. Move your weight back to equal on both legs, HOLD/FEEL  pressure in socket. Repeat.  Start with both hands on sink, progress to hand on prosthetic side only, then no hands.  Moving Cones / Cups: With equal weight on each leg: Hold on with one hand the first time, then progress to no hand supports. Move cups from one side of sink to the other. Place cups ~2" out of your reach, progress to 10" beyond reach.  Place one hand in middle of sink and reach with other hand. Do both arms.  Then hover one hand  and move cups with other hand.  Overhead/Upward Reaching: alternated reaching up to top cabinets or ceiling if no cabinets present. Keep equal weight on each leg. Start with one hand support on counter while other hand reaches and progress to no hand support with reaching.   ace one hand in middle of sink and reach with other hand. Do both arms.  Then hover one hand and move cups with other hand.  5.   Looking Over Shoulders: With equal weight on each leg: alternate turning to look over your shoulders with one hand support on counter as needed.  Start with head motions only to look in front of shoulder, then even with shoulder and progress to looking behind you. To look to side, move head /eyes, then shoulder on side looking pulls back, shift more weight to side looking and pull hip back. Place one hand in middle of sink and let go with other hand so your shoulder can pull back. Switch hands to look other way.   Then hover one hand and look over shoulder. If looking right, use left hand at sink. If looking left, use right hand at sink. 6.  Alternate Tapping Toes or Stepping to lower shelf of cabinet  Move items under cabinet out of your way. Shift your hips/pelvis so weight on stance leg & Tighten muscles in hip.  SLOWLY step other leg so front of foot is in cabinet. Then step back to floor. Repeat with other leg.      ASSESSMENT:   CLINICAL IMPRESSION: Patient's HR Iis not raising high & SpO2 is not dropping to unsafe levels with activities.  He is also not experiencing vertigo or dizziness with activities.  PT is limiting gait until he gets AFO / partial foot prosthesis but anticipates quicker progression with improvements in LE strength & functional range.   OBJECTIVE IMPAIRMENTS Abnormal gait, cardiopulmonary status limiting activity, decreased activity tolerance, decreased balance, decreased endurance, decreased knowledge of use of DME, decreased mobility, decreased ROM, decreased strength, dizziness, increased edema, impaired flexibility, postural dysfunction, prosthetic dependency , obesity, and pain.    ACTIVITY LIMITATIONS carrying, lifting, bending, standing, stairs, transfers, and locomotion level   PARTICIPATION LIMITATIONS: driving, community activity, and  yard work   [place], Time since onset of injury/illness/exacerbation, and 3+ comorbidities: see PMH  are also affecting patient's functional outcome.    REHAB POTENTIAL: Good   CLINICAL DECISION MAKING: Evolving/moderate complexity   EVALUATION COMPLEXITY: Moderate     GOALS: Goals reviewed with patient? Yes   SHORT TERM GOALS: Target date: 06/08/2022   Patient demonstrates understanding of initial HEP.  Baseline: SEE OBJECTIVE DATA Goal status: reviewed again today 06/09/22 and he showed good understanding 06/09/22 2. Patient able to reach 7" and look over both shoulders with RW support with supervision. Baseline: SEE OBJECTIVE DATA Goal status: INITIAL   3. Patient ambulates 82' with RW & prosthesis with supervision. Baseline: SEE OBJECTIVE DATA Goal status: not met as we are limiting his gait until he receives AFO, 06/09/22     LONG TERM GOALS: Target date: 08/04/2022   Patient demonstrates & verbalized understanding of prosthetic care to enable safe utilization of prosthesis. Baseline: SEE OBJECTIVE DATA Goal status: INITIAL   Patient tolerates right prosthesis & left AFO / partial foot prosthesis wear >90% of awake hours without skin or limb pain issues. Baseline: SEE OBJECTIVE DATA Goal status: INITIAL   Berg Balance >/= 20/56 and tasks of Berg with RW support >/= 45/56 to  indicate lower fall risk with standing ADLs Baseline: SEE OBJECTIVE DATA Goal status: INITIAL   Patient ambulates >300' with prosthesis & LRAD modified independent Baseline: SEE OBJECTIVE DATA Goal status: INITIAL   Patient negotiates ramps, curbs & stairs with single rail with prosthesis & LRAD modified independent. Baseline: SEE OBJECTIVE DATA Goal status: INITIAL     PLAN: PT FREQUENCY: 2x/week   PT DURATION: 13 weeks (90 days)   PLANNED INTERVENTIONS: Therapeutic exercises, Therapeutic activity, Neuromuscular re-education, Balance training, Gait training, Patient/Family  education, Stair training, Vestibular training, Canalith repositioning, Orthotic/Fit training, Prosthetic training, DME instructions, and physical performance testing   PLAN FOR NEXT SESSION:  do 10th visit note,  continue to monitor HR & SpO2 with activities, manual therapy & exercises for flexibility, leg press, standing balance activities, gait if he got shoe / AFO   Jamey Reas, PT, DPT 06/13/2022, 10:34 AM

## 2022-06-16 ENCOUNTER — Encounter: Payer: Self-pay | Admitting: Physical Therapy

## 2022-06-16 ENCOUNTER — Ambulatory Visit (INDEPENDENT_AMBULATORY_CARE_PROVIDER_SITE_OTHER): Payer: Medicare Other | Admitting: Physical Therapy

## 2022-06-16 DIAGNOSIS — R2681 Unsteadiness on feet: Secondary | ICD-10-CM

## 2022-06-16 DIAGNOSIS — R2689 Other abnormalities of gait and mobility: Secondary | ICD-10-CM | POA: Diagnosis not present

## 2022-06-16 DIAGNOSIS — M6281 Muscle weakness (generalized): Secondary | ICD-10-CM | POA: Diagnosis not present

## 2022-06-16 NOTE — Therapy (Addendum)
OUTPATIENT PHYSICAL THERAPY TREATMENT NOTE & 10TH VISIT PROGRESS NOTE   Patient Name: Shane [name] MRN: [id number] DOB:[birth year], 81 y.o., male Today's Date: 06/16/2022  PCP: Burnard Bunting, MD REFERRING PROVIDER: [name], MD  Progress Note Reporting Period 05/09/2022 to 06/16/2022  See note below for Objective Data and Assessment of Progress/Goals.      END OF SESSION:   PT End of Session - 06/16/22 0851     Visit Number 10    Number of Visits 26    Date for PT Re-Evaluation 08/04/22    Authorization Type UHC Medicare    Authorization Time Period [facility] MEDICARE  $20 COPAY    Progress Note Due on Visit 20    PT Start Time 0845    PT Stop Time 0929    PT Time Calculation (min) 44 min    Equipment Utilized During Treatment Gait belt    Activity Tolerance Patient tolerated treatment well;Patient limited by fatigue                   Past Medical History:  Diagnosis Date   Anxiety    AV block, Mobitz 1    Cataract    Chronic kidney disease    d/t DM   CKD (chronic kidney disease), stage III ([place])    Depression    Diabetes mellitus    Vgo disposal insulin bolus  simular to insulin pump   Dyspnea    GERD (gastroesophageal reflux disease)    History of kidney stones    passed   Hyperlipidemia    Hypertension    Idiopathic pulmonary fibrosis ([place]) 11/2016   ILD (interstitial lung disease) ([place])    Moderate aortic stenosis    a. 10/2019 Echo: EF 55-60%, Gr2 DD. Nl RV.    Neuromuscular disorder ([place])    Neuropathy associated with endocrine disorder ([place])    Nonobstructive CAD (coronary artery disease)    a. 2012 Cath: mod, nonobs dzs; b. 10/2016 MV: EF 60%, no ischemia.   OSA on CPAP 05/05/2017   Unattended Home Sleep Test 7/2/813-AHI 38.6/hour, desaturation to 64%, body weight 261 pounds   PONV (postoperative nausea and vomiting)    Sleep apnea     uses cpap asked to bring mask and tubing   Past Surgical History:  Procedure Laterality Date    ABDOMINAL AORTOGRAM W/LOWER EXTREMITY N/A 12/10/2020   Procedure: ABDOMINAL AORTOGRAM W/LOWER EXTREMITY;  Surgeon: Cherre Robins, MD;  Location: [place] [facility];  Service: Cardiovascular;  Laterality: N/A;   AMPUTATION Right 01/22/2021   Procedure: 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;  Surgeon: [name], MD;  Location: [place];  Service: Orthopedics;  Laterality: Right;   AMPUTATION Right 03/17/2021   Procedure: RIGHT BELOW KNEE AMPUTATION;  Surgeon: [name], MD;  Location: [place];  Service: Orthopedics;  Laterality: Right;   ANKLE FUSION Right 01/22/2021   Procedure: RIGHT FOOT TIBIOCALCANEAL FUSION;  Surgeon: [name], MD;  Location: [place];  Service: Orthopedics;  Laterality: Right;   ANTERIOR FUSION CERVICAL SPINE  2012   CARDIAC CATHETERIZATION  2011   CARDIAC CATHETERIZATION N/A 11/09/2016   Procedure: Right Heart Cath;  Surgeon: Belva Crome, MD;  Location: [place] [facility];  Service: Cardiovascular;  Laterality: N/A;   carpel tunnel     left wrist   CATARACT EXTRACTION     CATARACT EXTRACTION W/ INTRAOCULAR LENS  IMPLANT, BILATERAL  2013   CERVICAL LAMINECTOMY  2012   COLONOSCOPY N/A  01/14/2013   Procedure: COLONOSCOPY;  Surgeon: Irene Shipper, MD;  Location: [facility];  Service: Endoscopy;  Laterality: N/A;   CORONARY ARTERY BYPASS GRAFT  11/04/2020   LIMA-LAD, SVG-OM1, SVG-PDA (Dr [facility]) dc 11/18/2020   EYE SURGERY     I & D EXTREMITY Right 02/19/2021   Procedure: RIGHT ANKLE DEBRIDEMENT AND PLACEMENT ANTIBIOTIC BEADS;  Surgeon: [name], MD;  Location: [place];  Service: Orthopedics;  Laterality: Right;   KNEE SURGERY  1998   left   LEFT HEART CATH AND CORONARY ANGIOGRAPHY N/A 07/10/2020   Procedure: LEFT HEART CATH AND CORONARY ANGIOGRAPHY;  Surgeon: Sherren Mocha, MD;  Location: [place] [facility];  Service: Cardiovascular;  Laterality: N/A;   LUMBAR LAMINECTOMY  2003   LUNG BIOPSY Left 12/26/2016   Procedure: LUNG BIOPSY;  Surgeon: [name], MD;  Location: [place];  Service: Thoracic;  Laterality: Left;   PACEMAKER IMPLANT N/A 03/30/2020   Procedure: PACEMAKER IMPLANT;  Surgeon: Evans Lance, MD;  Location: [place] [facility];  Service: Cardiovascular;  Laterality: N/A;   PERIPHERAL VASCULAR INTERVENTION Right 12/10/2020   Procedure: PERIPHERAL VASCULAR INTERVENTION;  Surgeon: Cherre Robins, MD;  Location: [place] [facility];  Service: Cardiovascular;  Laterality: Right;  SFA   POSTERIOR FUSION CERVICAL SPINE  2012   PPM GENERATOR REMOVAL N/A 12/14/2020   Procedure: PPM GENERATOR REMOVAL;  Surgeon: Evans Lance, MD;  Location: [place] [facility];  Service: Cardiovascular;  Laterality: N/A;   TEE WITHOUT CARDIOVERSION N/A 12/11/2020   Procedure: TRANSESOPHAGEAL ECHOCARDIOGRAM (TEE);  Surgeon: Geralynn Rile, MD;  Location: [place];  Service: Cardiovascular;  Laterality: N/A;   TRIGGER FINGER RELEASE  2011   4th finger left hand   VIDEO ASSISTED THORACOSCOPY Left 12/26/2016   Procedure: VIDEO ASSISTED THORACOSCOPY;  Surgeon: [name], MD;  Location: [place];  Service: Thoracic;  Laterality: Left;   VIDEO BRONCHOSCOPY N/A 12/26/2016   Procedure: VIDEO BRONCHOSCOPY;  Surgeon: [name], MD;  Location: [place];  Service: Thoracic;  Laterality: N/A;   Patient Active Problem List   Diagnosis Date Noted   Intermediate stage nonexudative age-related macular degeneration of both eyes 04/25/2022   Research study patient 12/24/2021   Post-operative pain    CKD (chronic kidney disease), stage II    Labile blood glucose    Pulmonary fibrosis ([place])    Hyponatremia    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[place]) 03/26/2021   S/P BKA (below knee amputation) unilateral, right ([place])    Shortness of breath    Acute blood loss anemia    Stage 3 chronic kidney disease ([place])    Atrial fibrillation ([place])    Controlled type 2 diabetes mellitus with hyperglycemia, with long-term current use of insulin (H[facility])     Postoperative pain    Postoperative hemorrhagic shock 03/20/2021   Atherosclerosis of native arteries of extremities with gangrene, right leg ([place])    Hardware complicating wound infection ([place])    Abscess of bursa of right ankle 02/15/2021   Endocarditis 01/19/2021   Peripheral vascular disease ([place]) 12/29/2020   CHF (congestive heart failure), NYHA class II, chronic, diastolic ([place]) 17/40/8144   History of COVID-19 12/22/2020   SSS (sick sinus syndrome) ([place]) 12/22/2020   Acute bacterial endocarditis    MSSA bacteremia 12/09/2020   Diabetic foot ulcer ([place]) 12/09/2020   Foot drop, right foot    Generalized weakness 12/08/2020   Dehydration with hyponatremia 12/08/2020   Atrial  fibrillation, chronic ([place]) 12/08/2020   Elevated troponin level not due myocardial infarction 12/08/2020   Adrenal insufficiency ([place]) 11/14/2020   Orthostatic hypotension 11/14/2020   Physical deconditioning 11/14/2020   Difficulty sleeping 11/07/2020   Postoperative anemia due to acute blood loss 11/07/2020   Right ventricular dysfunction 11/05/2020   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/04/2020   Hearing loss 11/03/2020   Cardiac device in situ 09/09/2020   Coronary atherosclerosis due to lipid rich plaque 09/02/2020   COVID-19 virus infection 07/18/2020   Coronary artery disease involving native heart without angina pectoris 07/10/2020   Chronic kidney disease, stage 3a ([place]) 03/29/2020   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/28/2020   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 ([place]) 03/16/2020   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 ([place]) 03/16/2020   Right epiretinal membrane 03/16/2020   Posterior vitreous detachment of right eye 03/16/2020   Aortic stenosis, moderate 03/12/2020   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   Near syncope 04/27/2018   Hypoglycemia due to insulin 01/06/2018   Essential hypertension  01/06/2018   Diabetes mellitus type 2, with complication, on long term insulin pump ([place]) 01/06/2018   Syncope and collapse 01/05/2018   Encounter for therapeutic drug monitoring 06/29/2017   OSA on CPAP 05/05/2017   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[place]) 01/05/2017   Abnormal chest x-ray 10/12/2016   Benign neoplasm of colon 01/14/2013    REFERRING DIAG: Z89.511 (ICD-10-CM) - Hx of right BKA  ONSET DATE: 05/04/2022 MD visit with release to WB on LLE  THERAPY DIAG:  Unsteadiness on feet  Other abnormalities of gait and mobility  Muscle weakness (generalized)  Rationale for Evaluation and Treatment Rehabilitation  PERTINENT HISTORY: CKD st III, AV block Mobitz, pacemaker, pulmonary fibrosis, CAD, CABG 21, HTN, A-Fib, DM2, PAD, right BKA, Left TMA  PRECAUTIONS: ICD/Pacemaker  SUBJECTIVE: CPO notified that shoe will have to be sent to [place] for 4 weeks, but a temporary shoe can be provided in the meantime to use with double hand support. He reports that the wound is healing well and his PCP has continued the current plan for wound care.  PAIN:   Are you having pain?  No   OBJECTIVE: (objective measures completed at initial evaluation unless otherwise dated)  COGNITION: Overall cognitive status: Within functional limits for tasks assessed   POSTURE: 05/09/2022:  rounded shoulders, forward head, and flexed trunk    LOWER EXTREMITY ROM:   ROM P:passive  A:active Right Eval 05/09/2022 Left Eval 05/09/2022  Hip flexion      Hip extension A: -25* standing A: -25* standing  Hip abduction      Hip adduction      Hip internal rotation      Hip external rotation      Knee flexion      Knee extension      Ankle dorsiflexion NA P: -17*  Ankle plantarflexion NA    Ankle inversion NA    Ankle eversion NA     (Blank rows = not tested)   LOWER EXTREMITY MMT:   MMT Right Eval 05/09/2022 Left Eval 05/09/2022  Hip flexion      Hip extension Functional testing 3-/5 Functional  testing 3-/5  Hip abduction Functional testing 3-/5 Functional testing 3-/5  Hip adduction      Hip internal rotation      Hip external rotation      Knee flexion Functional testing  3-/5 Functional testing 3-/5  Knee extension Functional testing 3/5 Functional testing 3/5  Ankle dorsiflexion      Ankle plantarflexion      Ankle inversion      Ankle eversion      (Blank rows = not tested)   TRANSFERS: 05/09/2022:  Sit to stand: SBA and requires use of armrests from 20" stable w/c & needs RW for stabilization 05/09/2022:  Stand to sit: SBA and requires use of armrests from 20" stable w/c & needs RW for stabilization   GAIT: 05/09/2022:  Gait pattern: step to pattern, decreased step length- Left, decreased stance time- Right, Right hip hike, antalgic, lateral hip instability, trunk flexed, and wide BOS Distance walked: 25' Assistive device utilized: Environmental consultant - 2 wheeled and TTA prosthesis RLE Level of assistance: Min A Comments: pt reports dizziness with turning in gait   FUNCTIONAL TESTs:  Berg Balance Scale: 05/09/2022:  9/56 & tasks of Berg with RW support 27/56    [facility] PT Assessment - 05/09/22 0930                Berg Balance Test    Sit to Stand Needs moderate or maximal assist to stand   task with RW support = 3    Standing Unsupported Unable to stand 30 seconds unassisted   task with RW support = 3    Sitting with Back Unsupported but Feet Supported on Floor or Stool Able to sit safely and securely 2 minutes     Stand to Sit Uses backs of legs against chair to control descent   task with RW support = 3    Transfers Able to transfer safely, definite need of hands     Standing Unsupported with Eyes Closed Needs help to keep from falling   task with RW support = 3    Standing Unsupported with Feet Together Needs help to attain position and unable to hold for 15 seconds   task with RW support = 2    From Standing, Reach Forward with Outstretched Arm Loses balance while trying/requires  external support   task with RW support = 1    From Standing Position, Pick up Object from Floor Unable to try/needs assist to keep balance   task with RW support = 2    From Standing Position, Turn to Look Behind Over each Shoulder Needs assist to keep from losing balance and falling   task with RW support = 1    Turn 360 Degrees Needs assistance while turning   task with RW support = 0    Standing Unsupported, Alternately Place Feet on Step/Stool Needs assistance to keep from falling or unable to try   task with RW support = 0    Standing Unsupported, One Foot in [employer] balance while stepping or standing   task with RW support = 1    Standing on One Leg Unable to try or needs assist to prevent fall   task with RW support = 1    Total Score 9     Berg comment: tasks of Berg with RW support 27/56                     CURRENT PROSTHETIC WEAR ASSESSMENT: 05/09/2022:   Patient is dependent with: skin check, residual limb care, care of non-amputated limb, prosthetic cleaning, ply sock cleaning, and correct ply sock adjustment Donning prosthesis: Modified independence Doffing prosthesis: Modified independence Prosthetic wear tolerance: >90% of awake hours/day,  7 days/week Prosthetic weight bearing tolerance: 5 minutes Edema: LLE pitting edema with >10 sec capillary refill Residual limb condition: Right BKA limb has no openings, normal temperature & moisture.  Redness patella & patella tendon from friction of liner with knee flexed. Cylinderical shape.  LLE TMA pt's wife showed picture of wound on dorsum of foot that is being managed by PCP.   Prosthetic description: silicon liner with pin lock suspension, total contact socket, dynamic response foot.      TODAY'S TREATMENT:  06/15/2022 PT using 1/2" lift on LLE for activities.  PT limiting gait until he has AFO / partial foot prosthesis on LLE to avoid skin issues Therapeutic Exercise: Leg Press back 45* BLEs 125# 25 reps 1 sets,  single  LE 62# 10 reps 2 sets ea;  Back flat LEs 118# 25 reps  1 set,  single LE 56# 10 reps 2 sets Hamstring stretch seated with strap BLEs 30 sec 3 reps ea LE   Neuromuscular Re-ed Standing in //bars on ground and 1/2" lift under LLE to level pelvis -static: eyes open no UE support 3 x 5s CGA, patient reports feeling wobbly -horizontal and vertical head turns x5 min to mod BUE support -diagonal head turns x5 each direction with verbal cues min to mod BUE support, blood sugar reading 129 mg/dL -trunk rotations to look over each shoulder X8 bilat with BUE support  06/13/2022 Resting HR72 SpO2 97% PT using 1/2" lift on LLE for activities.  PT limiting gait until he has AFO / partial foot prosthesis on LLE to avoid skin issues Therapeutic Exercise: Leg Press back 45* BLEs 125# 25 reps 1 sets  single LE 62# 10 reps 2 sets ea HR 83 SpO2 99%;  Back flat LEs 118# 20 reps  1 set  single LE 56# 10 reps 2 sets ea LE HR 101 SpO2 97% Hamstring stretch seated with strap BLEs 30 sec 3 reps ea LE PT assist RLE Gastro stretch standing step with heel depression 30 sec hold 2 reps PT demo & verbal cues on technique & rational  Neuromuscular Re-ed Standing in //bars crossways on foam beam with feet hip width apart and 1/2" lift under LLE to level pelvis -static: eyes open no UE support 5 x 10s CGA -horizontal and vertical head turns x5 min to mod assist, HR 85 SpO2 100% -diagonal head turns x5 each direction with verbal cues min to mod assist, HR 129 SpO2 96%  06/09/2022 PT using 1/2" lift on LLE for activities.  PT limiting gait until he has AFO / partial foot prosthesis on LLE to avoid skin issues Therapeutic Exercise: Leg Press back 45* BLEs 118# 25 reps 1 sets  single LE 56# 10 reps 2 sets ea HR 60 SpO2 99%;  Back flat LEs 112# 20 reps  1 set  single LE 56# 10 reps 2 sets ea LE HR  Hamstring stretch seated with strap BLEs 30 sec 3 reps ea LE PT assist Hip flexor stretch BLEs 1st rep hooklying & 2nd rep knee to  chest supine dropping LE over edge 30 sec hold 2 reps ea LE  Neuromuscular Re-ed Standing at sink performing weight shifts X10 lateral and X10 A-P Standing at sink with one UE support and moving cones lateral with other 3 cones X 4 each reaching across midline to otherside Standing at sink alternate UE reaches into overhead cabinet X10 bilat Standing at sink with trunk rotations to look over each shoulder X10 bilat, HR 60, SPO2  95%    05/11/22 1636  PT Education  Education Details HEP at sink & sitting on bar stool to develop back strength / endurance  Person(s) Educated Patient;Spouse  Methods Explanation;Demonstration;Tactile cues;Verbal cues;Handout  Comprehension Verbalized understanding;Returned demonstration;Verbal cues required;Tactile cues required;Need further instruction   Neuromuscular Reed-  cues on proprioception RLE using residual limb, balance & standing tolerance.   Do each exercise 1-2  times per day Do each exercise 5-10 repetitions Hold each exercise for 2 seconds to feel your location  AT [place].  Try to find this position when standing still for activities.   USE TAPE ON FLOOR TO MARK THE MIDLINE POSITION which is even with middle of sink.  You also should try to feel with your limb pressure in socket.  You are trying to feel with limb what you used to feel with the bottom of your foot.  Side to Side Shift: Moving your hips only (not shoulders): move weight onto your left leg, HOLD/FEEL pressure in socket.  Move back to equal weight on each leg, HOLD/FEEL pressure in socket. Move weight onto your right leg, HOLD/FEEL pressure in socket. Move back to equal weight on each leg, HOLD/FEEL pressure in socket. Repeat.  Start with both hands on sink, progress to hand on prosthetic side only, then no hands.  Front to Back Shift: Moving your hips only (not shoulders): move your weight forward onto your  toes, HOLD/FEEL pressure in socket. Move your weight back to equal Flat Foot on both legs, HOLD/FEEL  pressure in socket. Move your weight back onto your heels, HOLD/FEEL  pressure in socket. Move your weight back to equal on both legs, HOLD/FEEL  pressure in socket. Repeat.  Start with both hands on sink, progress to hand on prosthetic side only, then no hands.  Moving Cones / Cups: With equal weight on each leg: Hold on with one hand the first time, then progress to no hand supports. Move cups from one side of sink to the other. Place cups ~2" out of your reach, progress to 10" beyond reach.  Place one hand in middle of sink and reach with other hand. Do both arms.  Then hover one hand and move cups with other hand.  Overhead/Upward Reaching: alternated reaching up to top cabinets or ceiling if no cabinets present. Keep equal weight on each leg. Start with one hand support on counter while other hand reaches and progress to no hand support with reaching.  ace one hand in middle of sink and reach with other hand. Do both arms.  Then hover one hand and move cups with other hand.  5.   Looking Over Shoulders: With equal weight on each leg: alternate turning to look over your shoulders with one hand support on counter as needed.  Start with head motions only to look in front of shoulder, then even with shoulder and progress to looking behind you. To look to side, move head /eyes, then shoulder on side looking pulls back, shift more weight to side looking and pull hip back. Place one hand in middle of sink and let go with other hand so your shoulder can pull back. Switch hands to look other way.   Then hover one hand and look over shoulder. If looking right, use left hand at sink. If looking left, use right hand at sink. 6.  Alternate Tapping Toes or Stepping to lower shelf of cabinet  Move  items under cabinet out of your way. Shift your hips/pelvis so weight on stance leg & Tighten muscles in hip.  SLOWLY step  other leg so front of foot is in cabinet. Then step back to floor. Repeat with other leg.      ASSESSMENT:   CLINICAL IMPRESSION: Patient has progressed in strengthening and static standing balance, using a 1/2'' lift under LLE to maintain level pelvis. He is responding well to treatment and vitals remain stable with activities. The monitored LLE wounds have improved in both depth and size, with only one remaining currently. PT is limiting gait and transfers until he gets AFO / partial foot prosthesis but anticipates quicker progression with improvements in LE strength & functional range. He will continue to benefit from skilled physical therapy to improve function and safety with prosthesis and AFO during gait, functional strengthening, and balance activity.  OBJECTIVE IMPAIRMENTS Abnormal gait, cardiopulmonary status limiting activity, decreased activity tolerance, decreased balance, decreased endurance, decreased knowledge of use of DME, decreased mobility, decreased ROM, decreased strength, dizziness, increased edema, impaired flexibility, postural dysfunction, prosthetic dependency , obesity, and pain.    ACTIVITY LIMITATIONS carrying, lifting, bending, standing, stairs, transfers, and locomotion level   PARTICIPATION LIMITATIONS: driving, community activity, and yard work   [place], Time since onset of injury/illness/exacerbation, and 3+ comorbidities: see PMH  are also affecting patient's functional outcome.    REHAB POTENTIAL: Good   CLINICAL DECISION MAKING: Evolving/moderate complexity   EVALUATION COMPLEXITY: Moderate     GOALS: Goals reviewed with patient? Yes   SHORT TERM GOALS: Target date: 06/08/2022   Patient demonstrates understanding of initial HEP.  Baseline: SEE OBJECTIVE DATA Goal status: reviewed again today 06/09/22 and he showed good understanding 06/09/22 2. Patient able to reach 7" and look over both shoulders with RW support with  supervision. Baseline: SEE OBJECTIVE DATA Goal status: not met 06/16/22, progressing   3. Patient ambulates 55' with RW & prosthesis with supervision. Baseline: SEE OBJECTIVE DATA Goal status: not met as we are limiting his gait until he receives AFO, 06/09/22  SHORT TERM GOALS: Target date: 07/14/2022  1. Patient able to stand with no UE support for 1 min with supervision. Baseline: SEE OBJECTIVE DATA Goal status: INITIAL 06/16/22  2. Patient independent with donning/doffing new AFO or prosthesis. Baseline: SEE OBJECTIVE DATA Goal status: INITIAL 06/16/22   LONG TERM GOALS: Target date: 08/04/2022   Patient demonstrates & verbalized understanding of prosthetic care to enable safe utilization of prosthesis. Baseline: SEE OBJECTIVE DATA Goal status: INITIAL   Patient tolerates right prosthesis & left AFO / partial foot prosthesis wear >90% of awake hours without skin or limb pain issues. Baseline: SEE OBJECTIVE DATA Goal status: INITIAL   Berg Balance >/= 20/56 and tasks of Berg with RW support >/= 45/56 to indicate lower fall risk with standing ADLs Baseline: SEE OBJECTIVE DATA Goal status: INITIAL   Patient ambulates >300' with prosthesis & LRAD modified independent Baseline: SEE OBJECTIVE DATA Goal status: INITIAL   Patient negotiates ramps, curbs & stairs with single rail with prosthesis & LRAD modified independent. Baseline: SEE OBJECTIVE DATA Goal status: INITIAL     PLAN: PT FREQUENCY: 2x/week   PT DURATION: 13 weeks (90 days)   PLANNED INTERVENTIONS: Therapeutic exercises, Therapeutic activity, Neuromuscular re-education, Balance training, Gait training, Patient/Family education, Stair training, Vestibular training, Canalith repositioning, Orthotic/Fit training, Prosthetic training, DME instructions, and physical performance testing   PLAN FOR NEXT SESSION:  continue to monitor HR & SpO2 with  activities, manual therapy & exercises for hip and knee flexibility, leg  press in incline and flat level, standing balance activities, gait and transfers with shoe / AFO   Jana Hakim, Student-PT 06/16/2022, 2:18 PM  This entire session of physical therapy was performed under the direct supervision of PT signing evaluation /treatment. PT reviewed note and agrees.  Jamey Reas, PT, DPT 06/16/2022, 2:36 PM Physical Therapist Specializing in Prosthetic Rehab [facility]. [address] [place], [place] [address] Phone [phone] FAX [phone]

## 2022-06-17 ENCOUNTER — Encounter (INDEPENDENT_AMBULATORY_CARE_PROVIDER_SITE_OTHER): Payer: Medicare Other | Admitting: Adult Health

## 2022-06-17 ENCOUNTER — Encounter: Payer: Self-pay | Admitting: Adult Health

## 2022-06-17 ENCOUNTER — Encounter: Payer: Medicare Other | Admitting: *Deleted

## 2022-06-17 DIAGNOSIS — J84112 Idiopathic pulmonary fibrosis: Secondary | ICD-10-CM

## 2022-06-17 DIAGNOSIS — Z006 Encounter for examination for normal comparison and control in clinical research program: Secondary | ICD-10-CM

## 2022-06-17 NOTE — Assessment & Plan Note (Signed)
Re-consent completed per protocol   

## 2022-06-17 NOTE — Assessment & Plan Note (Signed)
Cont current regimen

## 2022-06-17 NOTE — Research (Signed)
Title: [id number] Open-Label Extension (OLE) is a multi-center, single-arm, open-label extension (OLE) phase where subjects who complete the Week 48 visit of the main study are eligible to participate. The extension to the main study allows for the continued evaluation of the efficacy and safety of 30 mg/kg IV infusions of pamrevlumab administered every 3 weeks for 52 weeks in subjects with Idiopathic Pulmonary Fibrosis.   Primary endpoint is: To provide continued access to pamrevlumab in subjects with IPF who completed the Week 48 visit of the main study.  Protocol #: [id number], Clinical Trials #: [id number] Sponsor: [email or web address]  ([place], [place], [place]) [facility] @ [employer] Coordinator note :   This visit for Subject Shane [name] with DOB: October 27, 1941 on 06/17/2022 for the above protocol is Visit/Encounter # ET/28 days post last dose  and is for purpose of research.   The consent for this encounter is under Protocol Version :Protocol Amendment 6.0 (Approved date: 31OCT2022)  IB: Version 21 ([id number])  Main ICF: version [id number], revised 16Dec22 Optional Genetic Consent: version [id number] OLE ICF: version [phone]  Subject expressed continued interest and consent in continuing as a study subject. Subject confirmed that there was no change in contact information (e.g. address, telephone, email). Subject thanked for participation in research and contribution to science.    The Subject was informed that the PI Dr. Chase Caller continues to have oversight of the subject's visits and course  through relevant discussions, reviews and also specifically of this visit by routing of this note to the [facility].  This visit is a key visit of  end of study. The PI is  not available for this visit.   Because the PI is NOT available, the sub-I reported and CRC has confirmed that the PI has discussed the visit a-priori with the sub-investigator.  Subject was reconsented to the  latest version of the OLE ICF prior to any research procedures taking place. All procedures were completed per that above mentioned protocol. End of treatment visit and 28 day post last infusion visit were completed on the same day. Lab collection per protocol was completed. Refer to the subjects paper source binder for further details of the visit.      Signed by, [place] Bing, CMA, BS, Altha Coordinator  [place], [place] 2:35 PM 06/17/2022

## 2022-06-17 NOTE — Progress Notes (Signed)
@Patient  ID: Shane [name], male    DOB: 02-20-1941, 81 y.o.   MRN: [id number]  Chief Complaint  Patient presents with   Research    Referring provider: Burnard Bunting, MD  HPI: Title: [id number] Open-Label Extension (OLE) is a multi-center, single-arm, open-label extension (OLE) phase where subjects who complete the Week 48 visit of the main study are eligible to participate. The extension to the main study allows for the continued evaluation of the efficacy and safety of 30 mg/kg IV infusions of pamrevlumab administered every 3 weeks for 52 weeks in subjects with Idiopathic Pulmonary Fibrosis.   Primary endpoint is: To provide continued access to pamrevlumab in subjects with IPF who completed the Week 48 visit of the main study.  Protocol #: [id number], Clinical Trials #: [id number] Sponsor: [email or web address]  ([place], [place], [place])  Nature conservation officer Features of Pamrevlumab ([id number]) the study drug: a recombinant fully human IgG kappa monoclonal antibody that binds to CTGF and is being developed for treatment of diseases in which tissue fibrosis has a major pathogenic role. In particular, pamrevlumab appears to disrupt a CTGF autocrine loop in mesenchymal cells like myofibroblasts that reduces their recruitment of leukocytes like macrophages, mast and dendritic cells via chemokine secretion. This disruption results in collapse of the cellular crosstalk that drives tissue remodeling.  Key Inclusion Criteria Completed the Week 48 visit of the main study.   The investigator considers the subject to be suitable for continued participation in the OLE phase, including ability to comply with study visits every 3 weeks for pamrevlumab infusions.  A signed and dated IRB/IEC-approved informed consent must be obtained before dosing start, and preferably at the Week 48 visit of the main study.  Male subjects with partners of childbearing potential and male subjects of childbearing potential  (including those <1 year postmenopausal) must continue to use highly effective contraception methods until 3 months after the last dose of study drug in the OLE phase.   Key Exclusion Criteria Male subjects who are pregnant or nursing.  Use of any investigational drugs, or participation in a clinical trial with an investigational new drug, other than participation in the main study.  History of allergic or anaphylactic reaction to human, humanized, chimeric or murine monoclonal antibodies, or experienced an allergic or anaphylactic reaction to study drug or to any component of the excipient while participating in the main study.  Subjects who withdrew informed consent while participating in the main study.  The investigator judges that the subject is not suitable for participation, or is unable to fully participate in the OLE phase and complete it for any reason, including inability to comply with study procedures and treatment, or any other relevant medical or psychiatric conditions.   Mechanism of Action Pamrevlumab is a human recombinant IgG monoclonal antibody that binds to connective tissue growth factor (CTGF).  CTGF plays a role by mediating the process of fibrosis. By binding to CTGF, pamrevlumab blocks its biologic activity; thereby preventing cell proliferation, adhesion and migration of growth factors involved in fibrotic changes. It is also being studied in other conditions where fibrotic changes play a role; liver fibrosis, Duchenne muscular dystrophy and certain cancers.   Half-life 58-141 hours; t1/2 increases with increasing doses   Interactions No known drug interactions. All concomitant medications to be reviewed by PI.  Safety Data 1102 subjects have been exposed to pamrevlumab, 520 with IPF The most common treatment emergent adverse events (TEAEs) in all subjects with IPF include: cough, fatigue, dyspnea, upper respiratory tract  infection, bronchitis, nasopharyngitis No  known effect on qtc prolongation, renal or hepatic issues Cleared [facility] Meeting [phone] for protocol to continue without changes  Updates: 08-Oct-2021: As of this date 2 cases of Serious Adverse Event (SAE) anaphylactic reaction reported. The sponsor does not believe that changes to the conduct of this study are warranted at this time.  30-Aug-2021: Embryo fetal development study findings in male rabbits treated with pamrevlumab determined potential risks for pregnant male patients that could cause fetal abnormalities. Per sponsor, there are no recommended changes to safety monitoring, study procedures, or study conduct as a result of this finding. Per PI, no safety or data integrity has been compromised.    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 The sponsor does not believe that changes to the conduct of this study are warranted at this time.  08-Mar-2022: As of this date 9 cases of Serious Adverse Event (SAE) anaphylactic reaction reported. The sponsor does not believe that changes to the conduct of this study are warranted at this time. Pamrevlumab protocols include language on anaphylactic reactions in Sections 7.3.8. Hypersensitivity and Anaphylactic Reactions and per Section 4.3 Infusion-associated reactions deemed life-threatening are mandatory reasons for study drug discontinuation. Protocol procedures should be followed for subject monitoring during and after IV infusion of study drug. As a reminder, per recent protocol amendments on the schedule of assessments, in the event of any immunogenic reaction, including suspected drug-related hypersensitivity or anaphylaxis PK, HAHA, HAHA-NA, CTGF and tryptase should be drawn within 24 hours.  06/17/2022 Research visit  This visit for Shane [name] with DOB: February 25, 1941 on 06/17/2022 for the above protocol is Visit/Encounter # Re-consent   and is for purpose of Researcj  . Subject/LAR expressed  continued interest and consent in continuing as a study subject. Subject thanked for participation in research and contribution to science. Re-consent completed today per research protocol .      Allergies  Allergen Reactions   Codeine Hives and Itching   Ofev [Nintedanib] Diarrhea    SEVERE DIARRHEA   Pirfenidone Diarrhea and Other (See Comments)    Esbriet (Pirfenidone) causes elevated LFTs. 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 10/2019 Echo: EF 55-60%, Gr2 DD. Nl RV.    Neuromuscular disorder ([place])    Neuropathy associated with endocrine disorder ([place])    Nonobstructive CAD (coronary artery disease)    a. 2012 Cath: mod, nonobs dzs; b. 10/2016 MV: EF 60%, no ischemia.   OSA on CPAP 05/05/2017  Unattended Home Sleep Test 7/2/813-AHI 38.6/hour, desaturation to 64%, body weight 261 pounds   PONV (postoperative nausea and vomiting)    Sleep apnea     uses cpap asked to bring mask and tubing    Tobacco History: Social History   Tobacco Use  Smoking  Status Never  Smokeless Tobacco Never   Counseling given: Not Answered   Outpatient Medications Prior to Visit  Medication Sig Dispense Refill   acetaminophen (TYLENOL) 325 MG tablet Take 1-2 tablets (325-650 mg total) by mouth every 6 (six) hours as needed for mild pain (pain score 1-3 or temp > 100.5).     albuterol (VENTOLIN HFA) 108 (90 Base) MCG/ACT inhaler Inhale 2 puffs into the lungs every 4 (four) hours as needed for wheezing or shortness of breath. 6.7 g 0   apixaban (ELIQUIS) 5 MG TABS tablet Take 1 tablet (5 mg total) by mouth 2 (two) times daily. 60 tablet 11   Ascorbic Acid (VITAMIN C WITH ROSE HIPS) 500 MG tablet Take 1 tablet (500 mg total) by mouth daily. 30 tablet 0   atorvastatin (LIPITOR) 80 MG tablet Take 1 tablet (80 mg total) by mouth daily. 90 tablet 3   docusate sodium (COLACE) 100 MG capsule Take 1 capsule (100 mg total) by mouth daily. 10 capsule 0   doxycycline (VIBRA-TABS) 100 MG tablet Take 1 tablet (100 mg total) by mouth 2 (two) times daily. 60 tablet 0   escitalopram (LEXAPRO) 10 MG tablet Take 1 tablet (10 mg total) by mouth daily. 30 tablet 0   furosemide (LASIX) 40 MG tablet Take 1 tablet (40 mg total) by mouth daily. 30 tablet 0   gabapentin (NEURONTIN) 300 MG capsule TAKE 1 CAPSULE BY MOUTH THREE TIMES A DAY 180 capsule 3   insulin aspart (NOVOLOG FLEXPEN) 100 UNIT/ML FlexPen Inject 6 Units into the skin 3 (three) times daily with meals. 15 mL 11   insulin glargine (LANTUS) 100 UNIT/ML Solostar Pen Inject 8 Units into the skin at bedtime. 15 mL 11   iron polysaccharides (NIFEREX) 150 MG capsule Take 1 capsule (150 mg total) by mouth daily with lunch. 30 capsule 0   LORazepam (ATIVAN) 1 MG tablet Take 1 tablet (1 mg total) by mouth at bedtime. 30 tablet 0   metoprolol tartrate (LOPRESSOR) 50 MG tablet Take 0.5 tablets (25 mg total) by mouth daily. 30 tablet 0   Multiple Vitamin (MULTIVITAMIN WITH MINERALS) TABS tablet Take 1 tablet by mouth daily.      Olopatadine HCl (PATADAY OP) Place 1 drop into both eyes daily as needed (allergies).     pantoprazole (PROTONIX) 40 MG tablet Take 1 tablet (40 mg total) by mouth 2 (two) times daily. 60 tablet 0   polyethylene glycol powder (GLYCOLAX/MIRALAX) 17 GM/SCOOP powder Take 17 g by mouth daily as needed for moderate constipation.     Probiotic Product (PROBIOTIC PO) Take 1 capsule by mouth daily.     STUDY - FIBROGEN - pamrevlumab 10 mg/mL IV infusion (open label) (PI-RAMASWAMY) Inject 30 mg/kg into the vein every 21 ( twenty-one) days. For Investigational Use Only. Infused every 21 days. For questions regarding this medication please contact [facility].     traMADol (ULTRAM) 50 MG tablet Take 1 tablet (50 mg total) by mouth every 8 (eight) hours as needed for severe pain or moderate pain. 30 tablet 0   Zinc Oxide (TRIPLE PASTE) 12.8 % ointment Apply 1 application topically 2 (two) times daily. Apply to affected area 56.7 g  0   zinc sulfate 220 (50 Zn) MG capsule Take 1 capsule (220 mg total) by mouth daily. 30 capsule 0   No facility-administered medications prior to visit.      Lab Results:      Imaging: No results found.  perflutren lipid microspheres (DEFINITY) IV suspension     Date Action Dose Route User   05/17/2022 1015 Given 2 mL Intravenous (Left Arm) Ezzard Flax, Tammie M      STUDY - FIBROGEN - pamrevlumab 10 mg/mL IV infusion (open label) (PI-RAMASWAMY)     Date Action Dose Route User   04/29/2022 1032 New Bag/Given 3,060 mg Intravenous Koren Shiver, RN      STUDY - FIBROGEN - pamrevlumab 10 mg/mL IV infusion (open label) (PI-RAMASWAMY)     Date Action Dose Route User   05/20/2022 1007 New Bag/Given 3,060 mg Intravenous Adelina Mings, LPN          Latest Ref Rng & Units 09/21/2018   12:08 PM 05/23/2018   10:37 AM 12/25/2017    9:01 AM 10/04/2017    9:35 AM 04/06/2017   10:13 AM 10/14/2016    8:19 AM  PFT Results  FVC-Pre L  2.89  P 2.88  2.94  2.94  3.00    FVC-Predicted Pre %  61  P 61  62  62  63   FVC-Post L      3.00   FVC-Predicted Post %      63   Pre FEV1/FVC % %  91  P 86  92  91  90   Post FEV1/FCV % %      92   FEV1-Pre L  2.62  P 2.47  2.69  2.69  2.70   FEV1-Predicted Pre %  77  P 72  79  78  78   FEV1-Post L      2.75   DLCO uncorrected ml/min/mmHg 14.95  17.13  P 14.56  15.04  16.43  16.82   DLCO UNC% % 42  47  P 40  41  45  46   DLCO corrected ml/min/mmHg 15.26  17.33  P 15.57  15.31  19.35    DLCO COR %Predicted % 43  47  P 42  42  53    DLVA Predicted % 83  91  P 83  79  99  81   TLC L      4.62   TLC % Predicted %      60   RV % Predicted %      60     P Preliminary result    Lab Results  Component Value Date   NITRICOXIDE TEST WILL BE CREDITED 12/19/2016        Assessment & Plan:   Research study patient Re-consent completed per protocol   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[place]) Cont current regimen      Rexene Edison, NP 06/17/2022

## 2022-06-20 ENCOUNTER — Encounter: Payer: Self-pay | Admitting: Physical Therapy

## 2022-06-20 ENCOUNTER — Ambulatory Visit (INDEPENDENT_AMBULATORY_CARE_PROVIDER_SITE_OTHER): Payer: Medicare Other | Admitting: Physical Therapy

## 2022-06-20 DIAGNOSIS — R293 Abnormal posture: Secondary | ICD-10-CM

## 2022-06-20 DIAGNOSIS — R2681 Unsteadiness on feet: Secondary | ICD-10-CM | POA: Diagnosis not present

## 2022-06-20 DIAGNOSIS — Z9181 History of falling: Secondary | ICD-10-CM

## 2022-06-20 DIAGNOSIS — R2689 Other abnormalities of gait and mobility: Secondary | ICD-10-CM

## 2022-06-20 DIAGNOSIS — M6281 Muscle weakness (generalized): Secondary | ICD-10-CM | POA: Diagnosis not present

## 2022-06-20 NOTE — Therapy (Signed)
OUTPATIENT PHYSICAL THERAPY TREATMENT NOTE   Patient Name: Shane [name] MRN: [id number] DOB:1941/04/03, 81 y.o., male Today's Date: 06/20/2022  PCP: Burnard Bunting, MD REFERRING PROVIDER: [name], MD   END OF SESSION:   PT End of Session - 06/20/22 0805     Visit Number 11    Number of Visits 26    Date for PT Re-Evaluation 08/04/22    Authorization Type UHC Medicare    Authorization Time Period [facility] MEDICARE  $20 COPAY    Progress Note Due on Visit 20    PT Start Time 0801    PT Stop Time 0844    PT Time Calculation (min) 43 min    Equipment Utilized During Treatment Gait belt    Activity Tolerance Patient tolerated treatment well;Patient limited by fatigue                    Past Medical History:  Diagnosis Date   Anxiety    AV block, Mobitz 1    Cataract    Chronic kidney disease    d/t DM   CKD (chronic kidney disease), stage III ([place])    Depression    Diabetes mellitus    Vgo disposal insulin bolus  simular to insulin pump   Dyspnea    GERD (gastroesophageal reflux disease)    History of kidney stones    passed   Hyperlipidemia    Hypertension    Idiopathic pulmonary fibrosis ([place]) 11/2016   ILD (interstitial lung disease) ([place])    Moderate aortic stenosis    a. 10/2019 Echo: EF 55-60%, Gr2 DD. Nl RV.    Neuromuscular disorder ([place])    Neuropathy associated with endocrine disorder ([place])    Nonobstructive CAD (coronary artery disease)    a. 2012 Cath: mod, nonobs dzs; b. 10/2016 MV: EF 60%, no ischemia.   OSA on CPAP 05/05/2017   Unattended Home Sleep Test 7/2/813-AHI 38.6/hour, desaturation to 64%, body weight 261 pounds   PONV (postoperative nausea and vomiting)    Sleep apnea     uses cpap asked to bring mask and tubing   Past Surgical History:  Procedure Laterality Date   ABDOMINAL AORTOGRAM W/LOWER EXTREMITY N/A 12/10/2020   Procedure: ABDOMINAL AORTOGRAM W/LOWER EXTREMITY;  Surgeon: Cherre Robins, MD;  Location: [place]  [facility];  Service: Cardiovascular;  Laterality: N/A;   AMPUTATION Right 01/22/2021   Procedure: 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;  Surgeon: [name], MD;  Location: [place];  Service: Orthopedics;  Laterality: Right;   AMPUTATION Right 03/17/2021   Procedure: RIGHT BELOW KNEE AMPUTATION;  Surgeon: [name], MD;  Location: [place];  Service: Orthopedics;  Laterality: Right;   ANKLE FUSION Right 01/22/2021   Procedure: RIGHT FOOT TIBIOCALCANEAL FUSION;  Surgeon: [name], MD;  Location: [place];  Service: Orthopedics;  Laterality: Right;   ANTERIOR FUSION CERVICAL SPINE  2012   CARDIAC CATHETERIZATION  2011   CARDIAC CATHETERIZATION N/A 11/09/2016   Procedure: Right Heart Cath;  Surgeon: Belva Crome, MD;  Location: [place] [facility];  Service: Cardiovascular;  Laterality: N/A;   carpel tunnel     left wrist   CATARACT EXTRACTION     CATARACT EXTRACTION W/ INTRAOCULAR LENS  IMPLANT, BILATERAL  2013   CERVICAL LAMINECTOMY  2012   COLONOSCOPY N/A 01/14/2013   Procedure: COLONOSCOPY;  Surgeon: Irene Shipper, MD;  Location: [facility];  Service: Endoscopy;  Laterality: N/A;   CORONARY ARTERY BYPASS  GRAFT  11/04/2020   LIMA-LAD, SVG-OM1, SVG-PDA (Dr Mar[facility]) dc 11/18/2020   EYE SURGERY     I & D EXTREMITY Right 02/19/2021   Procedure: RIGHT ANKLE DEBRIDEMENT AND PLACEMENT ANTIBIOTIC BEADS;  Surgeon: [name], MD;  Location: [place];  Service: Orthopedics;  Laterality: Right;   KNEE SURGERY  1998   left   LEFT HEART CATH AND CORONARY ANGIOGRAPHY N/A 07/10/2020   Procedure: LEFT HEART CATH AND CORONARY ANGIOGRAPHY;  Surgeon: Sherren Mocha, MD;  Location: [place] [facility];  Service: Cardiovascular;  Laterality: N/A;   LUMBAR LAMINECTOMY  2003   LUNG BIOPSY Left 12/26/2016   Procedure: LUNG BIOPSY;  Surgeon: [name], MD;  Location: [place];  Service: Thoracic;  Laterality: Left;   PACEMAKER IMPLANT N/A 03/30/2020   Procedure: PACEMAKER IMPLANT;  Surgeon: Evans Lance,  MD;  Location: [place] [facility];  Service: Cardiovascular;  Laterality: N/A;   PERIPHERAL VASCULAR INTERVENTION Right 12/10/2020   Procedure: PERIPHERAL VASCULAR INTERVENTION;  Surgeon: Cherre Robins, MD;  Location: [place] [facility];  Service: Cardiovascular;  Laterality: Right;  SFA   POSTERIOR FUSION CERVICAL SPINE  2012   PPM GENERATOR REMOVAL N/A 12/14/2020   Procedure: PPM GENERATOR REMOVAL;  Surgeon: Evans Lance, MD;  Location: [place] [facility];  Service: Cardiovascular;  Laterality: N/A;   TEE WITHOUT CARDIOVERSION N/A 12/11/2020   Procedure: TRANSESOPHAGEAL ECHOCARDIOGRAM (TEE);  Surgeon: Geralynn Rile, MD;  Location: [place];  Service: Cardiovascular;  Laterality: N/A;   TRIGGER FINGER RELEASE  2011   4th finger left hand   VIDEO ASSISTED THORACOSCOPY Left 12/26/2016   Procedure: VIDEO ASSISTED THORACOSCOPY;  Surgeon: [name], MD;  Location: [place];  Service: Thoracic;  Laterality: Left;   VIDEO BRONCHOSCOPY N/A 12/26/2016   Procedure: VIDEO BRONCHOSCOPY;  Surgeon: [name], MD;  Location: [place];  Service: Thoracic;  Laterality: N/A;   Patient Active Problem List   Diagnosis Date Noted   Intermediate stage nonexudative age-related macular degeneration of both eyes 04/25/2022   Research study patient 12/24/2021   Post-operative pain    CKD (chronic kidney disease), stage II    Labile blood glucose    Pulmonary fibrosis ([place])    Hyponatremia    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[place]) 03/26/2021   S/P BKA (below knee amputation) unilateral, right ([place])    Shortness of breath    Acute blood loss anemia    Stage 3 chronic kidney disease ([place])    Atrial fibrillation ([place])    Controlled type 2 diabetes mellitus with hyperglycemia, with long-term current use of insulin (H[facility])    Postoperative pain    Postoperative hemorrhagic shock 03/20/2021   Atherosclerosis of native arteries of extremities with gangrene, right leg ([place])    Hardware complicating  wound infection ([place])    Abscess of bursa of right ankle 02/15/2021   Endocarditis 01/19/2021   Peripheral vascular disease ([place]) 12/29/2020   CHF (congestive heart failure), NYHA class II, chronic, diastolic ([place]) 73/22/0254   History of COVID-19 12/22/2020   SSS (sick sinus syndrome) ([place]) 12/22/2020   Acute bacterial endocarditis    MSSA bacteremia 12/09/2020   Diabetic foot ulcer ([place]) 12/09/2020   Foot drop, right foot    Generalized weakness 12/08/2020   Dehydration with hyponatremia 12/08/2020   Atrial fibrillation, chronic ([place]) 12/08/2020   Elevated troponin level not due myocardial infarction 12/08/2020   Adrenal insufficiency ([place]) 11/14/2020   Orthostatic hypotension 11/14/2020  Physical deconditioning 11/14/2020   Difficulty sleeping 11/07/2020   Postoperative anemia due to acute blood loss 11/07/2020   Right ventricular dysfunction 11/05/2020   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/04/2020   Hearing loss 11/03/2020   Cardiac device in situ 09/09/2020   Coronary atherosclerosis due to lipid rich plaque 09/02/2020   COVID-19 virus infection 07/18/2020   Coronary artery disease involving native heart without angina pectoris 07/10/2020   Chronic kidney disease, stage 3a ([place]) 03/29/2020   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/28/2020   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 ([place]) 03/16/2020   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 ([place]) 03/16/2020   Right epiretinal membrane 03/16/2020   Posterior vitreous detachment of right eye 03/16/2020   Aortic stenosis, moderate 03/12/2020   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   Near syncope 04/27/2018   Hypoglycemia due to insulin 01/06/2018   Essential hypertension 01/06/2018   Diabetes mellitus type 2, with complication, on long term insulin pump ([place]) 01/06/2018   Syncope and collapse 01/05/2018   Encounter for therapeutic drug  monitoring 06/29/2017   OSA on CPAP 05/05/2017   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[place]) 01/05/2017   Abnormal chest x-ray 10/12/2016   Benign neoplasm of colon 01/14/2013    REFERRING DIAG: Z89.511 (ICD-10-CM) - Hx of right BKA  ONSET DATE: 05/04/2022 MD visit with release to WB on LLE  THERAPY DIAG:  Unsteadiness on feet  Other abnormalities of gait and mobility  Muscle weakness (generalized)  History of falling  Abnormal posture  Rationale for Evaluation and Treatment Rehabilitation  PERTINENT HISTORY: CKD st III, AV block Mobitz, pacemaker, pulmonary fibrosis, CAD, CABG 21, HTN, A-Fib, DM2, PAD, right BKA, Left TMA  PRECAUTIONS: ICD/Pacemaker - continue monitoring vitals with activity and calculated HRmax is 151 bpm  SUBJECTIVE: He says he has been doing well over the weekend with some soreness in his LLE after noticing a small roll in his sock following a full day of wear. He reports that the RLE has no wounds and that he has been using mineral oil at night and wiping it off in the morning.  PAIN:   Are you having pain?  No   OBJECTIVE: (objective measures completed at initial evaluation unless otherwise dated)  COGNITION: Overall cognitive status: Within functional limits for tasks assessed   POSTURE: 05/09/2022:  rounded shoulders, forward head, and flexed trunk    LOWER EXTREMITY ROM:   ROM P:passive  A:active Right Eval 05/09/2022 Left Eval 05/09/2022  Hip flexion      Hip extension A: -25* standing A: -25* standing  Hip abduction      Hip adduction      Hip internal rotation      Hip external rotation      Knee flexion      Knee extension      Ankle dorsiflexion NA P: -17*  Ankle plantarflexion NA    Ankle inversion NA    Ankle eversion NA     (Blank rows = not tested)   LOWER EXTREMITY MMT:   MMT Right Eval 05/09/2022 Left Eval 05/09/2022  Hip flexion      Hip extension Functional testing 3-/5 Functional testing 3-/5  Hip abduction Functional  testing 3-/5 Functional testing 3-/5  Hip adduction      Hip internal rotation      Hip external rotation      Knee flexion Functional testing 3-/5 Functional  testing 3-/5  Knee extension Functional testing 3/5 Functional testing 3/5  Ankle dorsiflexion      Ankle plantarflexion      Ankle inversion      Ankle eversion      (Blank rows = not tested)   TRANSFERS: 05/09/2022:  Sit to stand: SBA and requires use of armrests from 20" stable w/c & needs RW for stabilization 05/09/2022:  Stand to sit: SBA and requires use of armrests from 20" stable w/c & needs RW for stabilization   GAIT: 05/09/2022:  Gait pattern: step to pattern, decreased step length- Left, decreased stance time- Right, Right hip hike, antalgic, lateral hip instability, trunk flexed, and wide BOS Distance walked: 25' Assistive device utilized: Environmental consultant - 2 wheeled and TTA prosthesis RLE Level of assistance: Min A Comments: pt reports dizziness with turning in gait   FUNCTIONAL TESTs:  Berg Balance Scale: 05/09/2022:  9/56 & tasks of Berg with RW support 27/56    [facility] PT Assessment - 05/09/22 0930                Berg Balance Test    Sit to Stand Needs moderate or maximal assist to stand   task with RW support = 3    Standing Unsupported Unable to stand 30 seconds unassisted   task with RW support = 3    Sitting with Back Unsupported but Feet Supported on Floor or Stool Able to sit safely and securely 2 minutes     Stand to Sit Uses backs of legs against chair to control descent   task with RW support = 3    Transfers Able to transfer safely, definite need of hands     Standing Unsupported with Eyes Closed Needs help to keep from falling   task with RW support = 3    Standing Unsupported with Feet Together Needs help to attain position and unable to hold for 15 seconds   task with RW support = 2    From Standing, Reach Forward with Outstretched Arm Loses balance while trying/requires external support   task with RW support =  1    From Standing Position, Pick up Object from Floor Unable to try/needs assist to keep balance   task with RW support = 2    From Standing Position, Turn to Look Behind Over each Shoulder Needs assist to keep from losing balance and falling   task with RW support = 1    Turn 360 Degrees Needs assistance while turning   task with RW support = 0    Standing Unsupported, Alternately Place Feet on Step/Stool Needs assistance to keep from falling or unable to try   task with RW support = 0    Standing Unsupported, One Foot in [employer] balance while stepping or standing   task with RW support = 1    Standing on One Leg Unable to try or needs assist to prevent fall   task with RW support = 1    Total Score 9     Berg comment: tasks of Berg with RW support 27/56                     CURRENT PROSTHETIC WEAR ASSESSMENT: 05/09/2022:   Patient is dependent with: skin check, residual limb care, care of non-amputated limb, prosthetic cleaning, ply sock cleaning, and correct ply sock adjustment Donning prosthesis: Modified independence Doffing prosthesis: Modified independence Prosthetic wear tolerance: >90% of awake hours/day, 7 days/week  Prosthetic weight bearing tolerance: 5 minutes Edema: LLE pitting edema with >10 sec capillary refill Residual limb condition: Right BKA limb has no openings, normal temperature & moisture.  Redness patella & patella tendon from friction of liner with knee flexed. Cylinderical shape.  LLE TMA pt's wife showed picture of wound on dorsum of foot that is being managed by PCP.   Prosthetic description: silicon liner with pin lock suspension, total contact socket, dynamic response foot.      TODAY'S TREATMENT:  06/20/2022 PT using 1/2" lift on LLE for activities.  PT limiting gait until he has AFO / partial foot prosthesis on LLE to avoid skin issues Therapeutic Exercise: -Leg Press back 45* BLEs 131# 25 reps 1 sets,  single LE 62# 10 reps 2 sets ea;  Back flat LEs  118# 25 reps  1 set,  single LE 56# 10 reps 2 sets  Neuromuscular Re-ed Green band and seat on 24" bar stool with no back support. PT using 1/2" lift on LLE for activities.  PT limiting gait until he has AFO / partial foot prosthesis on LLE to avoid skin issues -standing alternating banded rows 1 x 10 reps, verbal cueing for upright posture and pt feeling wobbly -seated BUE banded scapular rows 1 x 8 reps, HR 135 SpO2 94% then after 5 min seated rest HR 74 SpO2 94% -standing alternating banded forward press 1 x 5 reps -standing BUE banded forward press 1 x 5 reps, HR 138 SpO2 99% then after 3 min seated rest HR 93 SpO2 97% PT reviewed addition of seated green band UE exercise to HEP with verbal instructions, demo, and safety recommendations. Pt verbalized understanding and returned demo  -seated alternating banded rows 1 x 3 reps -seated BUE banded scapular rows 1 x 6 reps -standing alternating banded forward press 1 x 3 reps -standing BUE banded forward press 1 x 3 reps -seated alternating banded bicep curls + 90* shoulder flexion 1 x 10 reps -seated alternating banded trunk rotation 1 x 5 reps  06/15/2022 PT using 1/2" lift on LLE for activities.  PT limiting gait until he has AFO / partial foot prosthesis on LLE to avoid skin issues Therapeutic Exercise: Leg Press back 45* BLEs 125# 25 reps 1 sets,  single LE 62# 10 reps 2 sets ea;  Back flat LEs 118# 25 reps  1 set,  single LE 56# 10 reps 2 sets Hamstring stretch seated with strap BLEs 30 sec 3 reps ea LE   Neuromuscular Re-ed Standing in //bars on ground or foam and 1/2" lift under LLE to level pelvis -static: eyes open no UE support 3 x 5s CGA, patient reports feeling wobbly -horizontal and vertical head turns x5 min to mod BUE support -diagonal head turns x5 each direction with verbal cues min to mod BUE support, blood sugar reading 129 mg/dL -trunk rotations to look over each shoulder X8 bilat with BUE support  06/13/2022 Resting  HR72 SpO2 97% PT using 1/2" lift on LLE for activities.  PT limiting gait until he has AFO / partial foot prosthesis on LLE to avoid skin issues Therapeutic Exercise: Leg Press back 45* BLEs 125# 25 reps 1 sets  single LE 62# 10 reps 2 sets ea HR 83 SpO2 99%;  Back flat LEs 118# 20 reps  1 set  single LE 56# 10 reps 2 sets ea LE HR 101 SpO2 97% Hamstring stretch seated with strap BLEs 30 sec 3 reps ea LE PT assist RLE Gertie Fey  stretch standing step with heel depression 30 sec hold 2 reps PT demo & verbal cues on technique & rational  Neuromuscular Re-ed Standing in //bars crossways on foam beam with feet hip width apart and 1/2" lift under LLE to level pelvis -static: eyes open no UE support 5 x 10s CGA -horizontal and vertical head turns x5 min to mod assist, HR 85 SpO2 100% -diagonal head turns x5 each direction with verbal cues min to mod assist, HR 129 SpO2 96%    05/11/22 1636  PT Education  Education Details HEP at sink & sitting on bar stool to develop back strength / endurance  Person(s) Educated Patient;Spouse  Methods Explanation;Demonstration;Tactile cues;Verbal cues;Handout  Comprehension Verbalized understanding;Returned demonstration;Verbal cues required;Tactile cues required;Need further instruction   Neuromuscular Reed-  cues on proprioception RLE using residual limb, balance & standing tolerance.   Do each exercise 1-2  times per day Do each exercise 5-10 repetitions Hold each exercise for 2 seconds to feel your location  AT [place].  Try to find this position when standing still for activities.   USE TAPE ON FLOOR TO MARK THE MIDLINE POSITION which is even with middle of sink.  You also should try to feel with your limb pressure in socket.  You are trying to feel with limb what you used to feel with the bottom of your foot.  Side to Side Shift: Moving your hips only (not shoulders): move weight onto  your left leg, HOLD/FEEL pressure in socket.  Move back to equal weight on each leg, HOLD/FEEL pressure in socket. Move weight onto your right leg, HOLD/FEEL pressure in socket. Move back to equal weight on each leg, HOLD/FEEL pressure in socket. Repeat.  Start with both hands on sink, progress to hand on prosthetic side only, then no hands.  Front to Back Shift: Moving your hips only (not shoulders): move your weight forward onto your toes, HOLD/FEEL pressure in socket. Move your weight back to equal Flat Foot on both legs, HOLD/FEEL  pressure in socket. Move your weight back onto your heels, HOLD/FEEL  pressure in socket. Move your weight back to equal on both legs, HOLD/FEEL  pressure in socket. Repeat.  Start with both hands on sink, progress to hand on prosthetic side only, then no hands.  Moving Cones / Cups: With equal weight on each leg: Hold on with one hand the first time, then progress to no hand supports. Move cups from one side of sink to the other. Place cups ~2" out of your reach, progress to 10" beyond reach.  Place one hand in middle of sink and reach with other hand. Do both arms.  Then hover one hand and move cups with other hand.  Overhead/Upward Reaching: alternated reaching up to top cabinets or ceiling if no cabinets present. Keep equal weight on each leg. Start with one hand support on counter while other hand reaches and progress to no hand support with reaching.  ace one hand in middle of sink and reach with other hand. Do both arms.  Then hover one hand and move cups with other hand.  5.   Looking Over Shoulders: With equal weight on each leg: alternate turning to look over your shoulders with one hand support on counter as needed.  Start with head motions only to look in front of shoulder, then even with shoulder and progress to looking behind you. To look to side, move head /  eyes, then shoulder on side looking pulls back, shift more weight to side looking and pull hip back. Place one  hand in middle of sink and let go with other hand so your shoulder can pull back. Switch hands to look other way.   Then hover one hand and look over shoulder. If looking right, use left hand at sink. If looking left, use right hand at sink. 6.  Alternate Tapping Toes or Stepping to lower shelf of cabinet  Move items under cabinet out of your way. Shift your hips/pelvis so weight on stance leg & Tighten muscles in hip.  SLOWLY step other leg so front of foot is in cabinet. Then step back to floor. Repeat with other leg.      ASSESSMENT:   CLINICAL IMPRESSION: He was challenged in intensity with standing UE exercise to facilitate standing balance, and reached 91% of his calculated max HR with symptoms of dizziness. Vitals remained stable and HR and sx decreased with rest. The patient was educated on the addition of seated UE banded exercises to home program and recommendations in safety with verbal understanding - handout should be provided next session. Gait and tranfers remain limited by lack of LLE prosthesis, and he continues to benefit from skilled PT to address mobility, strengthening, and balance.  OBJECTIVE IMPAIRMENTS Abnormal gait, cardiopulmonary status limiting activity, decreased activity tolerance, decreased balance, decreased endurance, decreased knowledge of use of DME, decreased mobility, decreased ROM, decreased strength, dizziness, increased edema, impaired flexibility, postural dysfunction, prosthetic dependency , obesity, and pain.    ACTIVITY LIMITATIONS carrying, lifting, bending, standing, stairs, transfers, and locomotion level   PARTICIPATION LIMITATIONS: driving, community activity, and yard work   [place], Time since onset of injury/illness/exacerbation, and 3+ comorbidities: see PMH  are also affecting patient's functional outcome.    REHAB POTENTIAL: Good   CLINICAL DECISION MAKING: Evolving/moderate complexity   EVALUATION COMPLEXITY: Moderate      GOALS: Goals reviewed with patient? Yes   SHORT TERM GOALS: Target date: 06/08/2022   Patient demonstrates understanding of initial HEP.  Baseline: SEE OBJECTIVE DATA Goal status: reviewed again today 06/09/22 and he showed good understanding 06/09/22 2. Patient able to reach 7" and look over both shoulders with RW support with supervision. Baseline: SEE OBJECTIVE DATA Goal status: not met 06/16/22, progressing   3. Patient ambulates 69' with RW & prosthesis with supervision. Baseline: SEE OBJECTIVE DATA Goal status: not met as we are limiting his gait until he receives AFO, 06/09/22  SHORT TERM GOALS: Target date: 07/14/2022  1. Patient able to stand with no UE support for 1 min with supervision. Baseline: SEE OBJECTIVE DATA Goal status: INITIAL 06/16/22  2. Patient independent with donning/doffing new AFO or prosthesis. Baseline: SEE OBJECTIVE DATA Goal status: INITIAL 06/16/22   LONG TERM GOALS: Target date: 08/04/2022   Patient demonstrates & verbalized understanding of prosthetic care to enable safe utilization of prosthesis. Baseline: SEE OBJECTIVE DATA Goal status: INITIAL   Patient tolerates right prosthesis & left AFO / partial foot prosthesis wear >90% of awake hours without skin or limb pain issues. Baseline: SEE OBJECTIVE DATA Goal status: INITIAL   Berg Balance >/= 20/56 and tasks of Berg with RW support >/= 45/56 to indicate lower fall risk with standing ADLs Baseline: SEE OBJECTIVE DATA Goal status: INITIAL   Patient ambulates >300' with prosthesis & LRAD modified independent Baseline: SEE OBJECTIVE DATA Goal status: INITIAL   Patient negotiates ramps, curbs & stairs with single rail with prosthesis &  LRAD modified independent. Baseline: SEE OBJECTIVE DATA Goal status: INITIAL     PLAN: PT FREQUENCY: 2x/week   PT DURATION: 13 weeks (90 days)   PLANNED INTERVENTIONS: Therapeutic exercises, Therapeutic activity, Neuromuscular re-education, Balance training,  Gait training, Patient/Family education, Stair training, Vestibular training, Canalith repositioning, Orthotic/Fit training, Prosthetic training, DME instructions, and physical performance testing   PLAN FOR NEXT SESSION: continue to monitor HR & SpO2 with activities, standing UE balance activities with red band and seated with green band, provide handout with updated HEP. Continue manual therapy & exercises for hip and knee flexibility, leg press in incline and flat level, gait and transfers with shoe / AFO when provided   Jana Hakim, Student-PT 06/20/2022, 11:11 AM  This entire session of physical therapy was performed under the direct supervision of PT signing evaluation /treatment. PT reviewed note and agrees.  Jamey Reas, PT, DPT 06/20/2022, 1:06 PM Physical Therapist Specializing in Prosthetic Rehab [facility] [facility] [address] [place], [place] [address] Phone [phone] [phone]

## 2022-06-23 ENCOUNTER — Ambulatory Visit (INDEPENDENT_AMBULATORY_CARE_PROVIDER_SITE_OTHER): Payer: Medicare Other | Admitting: Physical Therapy

## 2022-06-23 ENCOUNTER — Encounter: Payer: Self-pay | Admitting: Physical Therapy

## 2022-06-23 DIAGNOSIS — R2689 Other abnormalities of gait and mobility: Secondary | ICD-10-CM | POA: Diagnosis not present

## 2022-06-23 DIAGNOSIS — M6281 Muscle weakness (generalized): Secondary | ICD-10-CM

## 2022-06-23 DIAGNOSIS — R2681 Unsteadiness on feet: Secondary | ICD-10-CM | POA: Diagnosis not present

## 2022-06-23 DIAGNOSIS — Z9181 History of falling: Secondary | ICD-10-CM | POA: Diagnosis not present

## 2022-06-23 DIAGNOSIS — R600 Localized edema: Secondary | ICD-10-CM

## 2022-06-23 DIAGNOSIS — M25512 Pain in left shoulder: Secondary | ICD-10-CM

## 2022-06-23 DIAGNOSIS — M25612 Stiffness of left shoulder, not elsewhere classified: Secondary | ICD-10-CM

## 2022-06-23 DIAGNOSIS — R293 Abnormal posture: Secondary | ICD-10-CM

## 2022-06-23 NOTE — Therapy (Signed)
OUTPATIENT PHYSICAL THERAPY TREATMENT NOTE   Patient Name: Shane [name] MRN: [id number] DOB:1941-03-09, 81 y.o., male Today's Date: 06/23/2022  PCP: Burnard Bunting, MD REFERRING PROVIDER: [name], MD for TTA & [name], NP for left shoulder   END OF SESSION:   PT End of Session - 06/23/22 1247     Visit Number 12    Number of Visits 26    Date for PT Re-Evaluation 08/04/22    Authorization Type UHC Medicare    Authorization Time Period [facility] MEDICARE  $20 COPAY    Progress Note Due on Visit 20    PT Start Time 0800    PT Stop Time 0845    PT Time Calculation (min) 45 min    Equipment Utilized During Treatment Gait belt    Activity Tolerance Patient tolerated treatment well;Patient limited by fatigue;Patient limited by pain    Behavior During Therapy WFL for tasks assessed/performed                     Past Medical History:  Diagnosis Date   Anxiety    AV block, Mobitz 1    Cataract    Chronic kidney disease    d/t DM   CKD (chronic kidney disease), stage III ([place])    Depression    Diabetes mellitus    Vgo disposal insulin bolus  simular to insulin pump   Dyspnea    GERD (gastroesophageal reflux disease)    History of kidney stones    passed   Hyperlipidemia    Hypertension    Idiopathic pulmonary fibrosis ([place]) 11/2016   ILD (interstitial lung disease) ([place])    Moderate aortic stenosis    a. 10/2019 Echo: EF 55-60%, Gr2 DD. Nl RV.    Neuromuscular disorder ([place])    Neuropathy associated with endocrine disorder ([place])    Nonobstructive CAD (coronary artery disease)    a. 2012 Cath: mod, nonobs dzs; b. 10/2016 MV: EF 60%, no ischemia.   OSA on CPAP 05/05/2017   Unattended Home Sleep Test 7/2/813-AHI 38.6/hour, desaturation to 64%, body weight 261 pounds   PONV (postoperative nausea and vomiting)    Sleep apnea     uses cpap asked to bring mask and tubing   Past Surgical History:  Procedure Laterality Date   ABDOMINAL AORTOGRAM  W/LOWER EXTREMITY N/A 12/10/2020   Procedure: ABDOMINAL AORTOGRAM W/LOWER EXTREMITY;  Surgeon: Cherre Robins, MD;  Location: [place] [facility];  Service: Cardiovascular;  Laterality: N/A;   AMPUTATION Right 01/22/2021   Procedure: 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;  Surgeon: [name], MD;  Location: [place];  Service: Orthopedics;  Laterality: Right;   AMPUTATION Right 03/17/2021   Procedure: RIGHT BELOW KNEE AMPUTATION;  Surgeon: [name], MD;  Location: [place];  Service: Orthopedics;  Laterality: Right;   ANKLE FUSION Right 01/22/2021   Procedure: RIGHT FOOT TIBIOCALCANEAL FUSION;  Surgeon: [name], MD;  Location: [place];  Service: Orthopedics;  Laterality: Right;   ANTERIOR FUSION CERVICAL SPINE  2012   CARDIAC CATHETERIZATION  2011   CARDIAC CATHETERIZATION N/A 11/09/2016   Procedure: Right Heart Cath;  Surgeon: Belva Crome, MD;  Location: [place] [facility];  Service: Cardiovascular;  Laterality: N/A;   carpel tunnel     left wrist   CATARACT EXTRACTION     CATARACT EXTRACTION W/ INTRAOCULAR LENS  IMPLANT, BILATERAL  2013   CERVICAL LAMINECTOMY  2012   COLONOSCOPY N/A 01/14/2013  Procedure: COLONOSCOPY;  Surgeon: Irene Shipper, MD;  Location: [facility];  Service: Endoscopy;  Laterality: N/A;   CORONARY ARTERY BYPASS GRAFT  11/04/2020   LIMA-LAD, SVG-OM1, SVG-PDA (Dr Marney [facility]) dc 11/18/2020   EYE SURGERY     I & D EXTREMITY Right 02/19/2021   Procedure: RIGHT ANKLE DEBRIDEMENT AND PLACEMENT ANTIBIOTIC BEADS;  Surgeon: [name], MD;  Location: [place];  Service: Orthopedics;  Laterality: Right;   KNEE SURGERY  1998   left   LEFT HEART CATH AND CORONARY ANGIOGRAPHY N/A 07/10/2020   Procedure: LEFT HEART CATH AND CORONARY ANGIOGRAPHY;  Surgeon: Sherren Mocha, MD;  Location: [place] [facility];  Service: Cardiovascular;  Laterality: N/A;   LUMBAR LAMINECTOMY  2003   LUNG BIOPSY Left 12/26/2016   Procedure: LUNG BIOPSY;  Surgeon: [name], MD;  Location: [place];  Service: Thoracic;  Laterality: Left;   PACEMAKER IMPLANT N/A 03/30/2020   Procedure: PACEMAKER IMPLANT;  Surgeon: Evans Lance, MD;  Location: [place] [facility];  Service: Cardiovascular;  Laterality: N/A;   PERIPHERAL VASCULAR INTERVENTION Right 12/10/2020   Procedure: PERIPHERAL VASCULAR INTERVENTION;  Surgeon: Cherre Robins, MD;  Location: [place] [facility];  Service: Cardiovascular;  Laterality: Right;  SFA   POSTERIOR FUSION CERVICAL SPINE  2012   PPM GENERATOR REMOVAL N/A 12/14/2020   Procedure: PPM GENERATOR REMOVAL;  Surgeon: Evans Lance, MD;  Location: [place] [facility];  Service: Cardiovascular;  Laterality: N/A;   TEE WITHOUT CARDIOVERSION N/A 12/11/2020   Procedure: TRANSESOPHAGEAL ECHOCARDIOGRAM (TEE);  Surgeon: Geralynn Rile, MD;  Location: [place];  Service: Cardiovascular;  Laterality: N/A;   TRIGGER FINGER RELEASE  2011   4th finger left hand   VIDEO ASSISTED THORACOSCOPY Left 12/26/2016   Procedure: VIDEO ASSISTED THORACOSCOPY;  Surgeon: [name], MD;  Location: [place];  Service: Thoracic;  Laterality: Left;   VIDEO BRONCHOSCOPY N/A 12/26/2016   Procedure: VIDEO BRONCHOSCOPY;  Surgeon: [name], MD;  Location: [place];  Service: Thoracic;  Laterality: N/A;   Patient Active Problem List   Diagnosis Date Noted   Intermediate stage nonexudative age-related macular degeneration of both eyes 04/25/2022   Research study patient 12/24/2021   Post-operative pain    CKD (chronic kidney disease), stage II    Labile blood glucose    Pulmonary fibrosis ([place])    Hyponatremia    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[place]) 03/26/2021   S/P BKA (below knee amputation) unilateral, right ([place])    Shortness of breath    Acute blood loss anemia    Stage 3 chronic kidney disease ([place])    Atrial fibrillation ([place])    Controlled type 2 diabetes mellitus with hyperglycemia, with long-term current use of insulin ([facility])    Postoperative pain    Postoperative  hemorrhagic shock 03/20/2021   Atherosclerosis of native arteries of extremities with gangrene, right leg ([place])    Hardware complicating wound infection ([place])    Abscess of bursa of right ankle 02/15/2021   Endocarditis 01/19/2021   Peripheral vascular disease ([place]) 12/29/2020   CHF (congestive heart failure), NYHA class II, chronic, diastolic ([place]) 32/20/2542   History of COVID-19 12/22/2020   SSS (sick sinus syndrome) ([place]) 12/22/2020   Acute bacterial endocarditis    MSSA bacteremia 12/09/2020   Diabetic foot ulcer ([place]) 12/09/2020   Foot drop, right foot    Generalized weakness 12/08/2020   Dehydration with hyponatremia 12/08/2020   Atrial fibrillation, chronic ([place])  12/08/2020   Elevated troponin level not due myocardial infarction 12/08/2020   Adrenal insufficiency ([place]) 11/14/2020   Orthostatic hypotension 11/14/2020   Physical deconditioning 11/14/2020   Difficulty sleeping 11/07/2020   Postoperative anemia due to acute blood loss 11/07/2020   Right ventricular dysfunction 11/05/2020   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/04/2020   Hearing loss 11/03/2020   Cardiac device in situ 09/09/2020   Coronary atherosclerosis due to lipid rich plaque 09/02/2020   COVID-19 virus infection 07/18/2020   Coronary artery disease involving native heart without angina pectoris 07/10/2020   Chronic kidney disease, stage 3a ([place]) 03/29/2020   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/28/2020   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 ([place]) 03/16/2020   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 ([place]) 03/16/2020   Right epiretinal membrane 03/16/2020   Posterior vitreous detachment of right eye 03/16/2020   Aortic stenosis, moderate 03/12/2020   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   Near syncope 04/27/2018   Hypoglycemia due to insulin 01/06/2018   Essential hypertension 01/06/2018   Diabetes mellitus type  2, with complication, on long term insulin pump ([place]) 01/06/2018   Syncope and collapse 01/05/2018   Encounter for therapeutic drug monitoring 06/29/2017   OSA on CPAP 05/05/2017   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[place]) 01/05/2017   Abnormal chest x-ray 10/12/2016   Benign neoplasm of colon 01/14/2013    REFERRING DIAG: Z89.511 (ICD-10-CM) - Hx of right BKA  ONSET DATE: 05/04/2022 MD visit with release to WB on LLE  THERAPY DIAG:  Unsteadiness on feet  Other abnormalities of gait and mobility  Muscle weakness (generalized)  History of falling  Abnormal posture  Acute pain of left shoulder  Stiffness of left shoulder, not elsewhere classified  Localized edema  Rationale for Evaluation and Treatment Rehabilitation  PERTINENT HISTORY: CKD st III, AV block Mobitz, pacemaker, pulmonary fibrosis, CAD, CABG 21, HTN, A-Fib, DM2, PAD, right BKA, Left TMA  PRECAUTIONS: ICD/Pacemaker - continue monitoring vitals with activity and calculated HRmax is 151 bpm  SUBJECTIVE: He saw [name], PA yesterday for weekly appt.  The wound continues to improve. She wrote PT order for eval & treat left shoulder pain, rotator cuff strain.  His left shoulder started bothering him ~1 month ago.  He notes that pulling on bar for transfers may be a factor in onset of pain.   PAIN:    Are you having pain? Yes: NPRS scale: this morning 2/10, over last week lowest 2/10 and highest 10/10 Pain location: left shoulder lateral just distal to joint Pain description: at rest dull ache and movement sharp Aggravating factors: moving left shoulder from position of hanging by side Relieving factors: supporting arm against chest or on armrest    OBJECTIVE: (objective measures completed at initial evaluation unless otherwise dated)  COGNITION: Overall cognitive status: Within functional limits for tasks assessed   POSTURE: 05/09/2022:  rounded shoulders, forward head, and flexed trunk    LOWER  EXTREMITY ROM:    UPPER EXTREMITY ROM:  ROM P:passive  A:active Left 06/23/22 Supine all limited by pain  Shoulder flexion 30*  Shoulder extension 0* neutral  Shoulder abduction 45*  Shoulder adduction   Shoulder internal rotation 20*  Shoulder external rotation 10*  Elbow flexion   Elbow extension -15*  Wrist flexion   Wrist extension   Wrist ulnar deviation   Wrist radial deviation   Wrist pronation   Wrist  supination    (Blank rows = not tested)    ROM P:passive  A:active Right Eval 05/09/2022 Left Eval 05/09/2022  Hip flexion      Hip extension A: -25* standing A: -25* standing  Hip abduction      Hip adduction      Hip internal rotation      Hip external rotation      Knee flexion      Knee extension      Ankle dorsiflexion NA P: -17*  Ankle plantarflexion NA    Ankle inversion NA    Ankle eversion NA     (Blank rows = not tested)   LOWER EXTREMITY MMT:   MMT Right Eval 05/09/2022 Left Eval 05/09/2022  Hip flexion      Hip extension Functional testing 3-/5 Functional testing 3-/5  Hip abduction Functional testing 3-/5 Functional testing 3-/5  Hip adduction      Hip internal rotation      Hip external rotation      Knee flexion Functional testing 3-/5 Functional testing 3-/5  Knee extension Functional testing 3/5 Functional testing 3/5  Ankle dorsiflexion      Ankle plantarflexion      Ankle inversion      Ankle eversion      (Blank rows = not tested)   POSTURE:  06/23/2022  seated - head forward, rounded shoulders and posterior pelvic tilt  TRANSFERS: 05/09/2022:  Sit to stand: SBA and requires use of armrests from 20" stable w/c & needs RW for stabilization 05/09/2022:  Stand to sit: SBA and requires use of armrests from 20" stable w/c & needs RW for stabilization   GAIT: 05/09/2022:  Gait pattern: step to pattern, decreased step length- Left, decreased stance time- Right, Right hip hike, antalgic, lateral hip instability, trunk flexed, and wide  BOS Distance walked: 25' Assistive device utilized: Environmental consultant - 2 wheeled and TTA prosthesis RLE Level of assistance: Min A Comments: pt reports dizziness with turning in gait   UE FUNCTION: 06/23/2022:  Pt reports that he uses RUE for fine motor like writing but uses both hands for gross motor or sports.  Seated - pt unable to raise LUE in flexion or abd due to pain.  Supine - pt unable to move LUE in flexion or abd due to pain.  PROM flexion > pain than abd   Pt tolerates elbow ext with palm supinated but with palm neutral causes shoulder pain. Also painful with elbow already extended and pronate palm.   PT simulated pushing on RW thru LUE with manual resistance.  Pt reports slight increase in pain but not as sharp as above function.    FUNCTIONAL TESTs:  Merrilee Jansky Balance Scale: 05/09/2022:  9/56 & tasks of Berg with RW support 27/56    [facility] PT Assessment - 05/09/22 0930                Berg Balance Test    Sit to Stand Needs moderate or maximal assist to stand   task with RW support = 3    Standing Unsupported Unable to stand 30 seconds unassisted   task with RW support = 3    Sitting with Back Unsupported but Feet Supported on Floor or Stool Able to sit safely and securely 2 minutes     Stand to Sit Uses backs of legs against chair to control descent   task with RW support = 3    Transfers Able to transfer safely, definite need  of hands     Standing Unsupported with Eyes Closed Needs help to keep from falling   task with RW support = 3    Standing Unsupported with Feet Together Needs help to attain position and unable to hold for 15 seconds   task with RW support = 2    From Standing, Reach Forward with Outstretched Arm Loses balance while trying/requires external support   task with RW support = 1    From Standing Position, Pick up Object from Floor Unable to try/needs assist to keep balance   task with RW support = 2    From Standing Position, Turn to Look Behind Over each Shoulder Needs  assist to keep from losing balance and falling   task with RW support = 1    Turn 360 Degrees Needs assistance while turning   task with RW support = 0    Standing Unsupported, Alternately Place Feet on Step/Stool Needs assistance to keep from falling or unable to try   task with RW support = 0    Standing Unsupported, One Foot in [employer] balance while stepping or standing   task with RW support = 1    Standing on One Leg Unable to try or needs assist to prevent fall   task with RW support = 1    Total Score 9     Berg comment: tasks of Berg with RW support 27/56                     CURRENT PROSTHETIC WEAR ASSESSMENT: 05/09/2022:   Patient is dependent with: skin check, residual limb care, care of non-amputated limb, prosthetic cleaning, ply sock cleaning, and correct ply sock adjustment Donning prosthesis: Modified independence Doffing prosthesis: Modified independence Prosthetic wear tolerance: >90% of awake hours/day, 7 days/week Prosthetic weight bearing tolerance: 5 minutes Edema: LLE pitting edema with >10 sec capillary refill Residual limb condition: Right BKA limb has no openings, normal temperature & moisture.  Redness patella & patella tendon from friction of liner with knee flexed. Cylinderical shape.  LLE TMA pt's wife showed picture of wound on dorsum of foot that is being managed by PCP.   Prosthetic description: silicon liner with pin lock suspension, total contact socket, dynamic response foot.      TODAY'S TREATMENT:  06/23/2022 PT educated pt & wife on upper body posture & relationship to shoulder function / pain. PT demo & verbal cues on positioning LUE seated & supine. Pt & wife verbalized understanding.    06/23/22 1413  PT Education  Education Details Access Code: [email or web address]  Person(s) Educated Patient;Spouse  Methods Explanation;Handout;Demonstration;Tactile cues;Verbal cues  Comprehension Verbalized understanding;Returned demonstration;Verbal cues  required;Tactile cues required;Need further instruction    Access Code: [email or web address] URL: [email or web address] Date: 06/23/2022 Prepared by: Jamey Reas  Exercises - Seated Correct Posture  - 1 x daily - 7 x weekly - Seated Scapular Retraction  - 2-5 x daily - 7 x weekly - 1 sets - 10 reps - 5 seconds hold - Supine Chin Tucks on Flat Ball  - 2-5 x daily - 7 x weekly - 1 sets - 10 reps - 5 seconds hold - Seated Isometric Cervical Retraction with Chin Tucks with Pillow behind head  - 2-5 x daily - 7 x weekly - 1 sets - 10 reps - 5 seconds hold  06/20/2022 PT using 1/2" lift on LLE for activities.  PT limiting gait until he has AFO / partial foot prosthesis on LLE  to avoid skin issues Therapeutic Exercise: -Leg Press back 45* BLEs 131# 25 reps 1 sets,  single LE 62# 10 reps 2 sets ea;  Back flat LEs 118# 25 reps  1 set,  single LE 56# 10 reps 2 sets  Neuromuscular Re-ed Green band and seat on 24" bar stool with no back support. PT using 1/2" lift on LLE for activities.  PT limiting gait until he has AFO / partial foot prosthesis on LLE to avoid skin issues -standing alternating banded rows 1 x 10 reps, verbal cueing for upright posture and pt feeling wobbly -seated BUE banded scapular rows 1 x 8 reps, HR 135 SpO2 94% then after 5 min seated rest HR 74 SpO2 94% -standing alternating banded forward press 1 x 5 reps -standing BUE banded forward press 1 x 5 reps, HR 138 SpO2 99% then after 3 min seated rest HR 93 SpO2 97% PT reviewed addition of seated green band UE exercise to HEP with verbal instructions, demo, and safety recommendations. Pt verbalized understanding and returned demo  -seated alternating banded rows 1 x 3 reps -seated BUE banded scapular rows 1 x 6 reps -standing alternating banded forward press 1 x 3 reps -standing BUE banded forward press 1 x 3 reps -seated alternating banded bicep curls + 90* shoulder flexion 1 x 10 reps -seated alternating banded trunk  rotation 1 x 5 reps  06/15/2022 PT using 1/2" lift on LLE for activities.  PT limiting gait until he has AFO / partial foot prosthesis on LLE to avoid skin issues Therapeutic Exercise: Leg Press back 45* BLEs 125# 25 reps 1 sets,  single LE 62# 10 reps 2 sets ea;  Back flat LEs 118# 25 reps  1 set,  single LE 56# 10 reps 2 sets Hamstring stretch seated with strap BLEs 30 sec 3 reps ea LE   Neuromuscular Re-ed Standing in //bars on ground or foam and 1/2" lift under LLE to level pelvis -static: eyes open no UE support 3 x 5s CGA, patient reports feeling wobbly -horizontal and vertical head turns x5 min to mod BUE support -diagonal head turns x5 each direction with verbal cues min to mod BUE support, blood sugar reading 129 mg/dL -trunk rotations to look over each shoulder X8 bilat with BUE support     05/11/22 1636  PT Education  Education Details HEP at sink & sitting on bar stool to develop back strength / endurance  Person(s) Educated Patient;Spouse  Methods Explanation;Demonstration;Tactile cues;Verbal cues;Handout  Comprehension Verbalized understanding;Returned demonstration;Verbal cues required;Tactile cues required;Need further instruction   Neuromuscular Reed-  cues on proprioception RLE using residual limb, balance & standing tolerance.   Do each exercise 1-2  times per day Do each exercise 5-10 repetitions Hold each exercise for 2 seconds to feel your location  AT [place].  Try to find this position when standing still for activities.   USE TAPE ON FLOOR TO MARK THE MIDLINE POSITION which is even with middle of sink.  You also should try to feel with your limb pressure in socket.  You are trying to feel with limb what you used to feel with the bottom of your foot.  Side to Side Shift: Moving your hips only (not shoulders): move weight onto your left leg, HOLD/FEEL pressure in socket.  Move back to equal  weight on each leg, HOLD/FEEL pressure in socket. Move weight onto your right leg, HOLD/FEEL pressure in  socket. Move back to equal weight on each leg, HOLD/FEEL pressure in socket. Repeat.  Start with both hands on sink, progress to hand on prosthetic side only, then no hands.  Front to Back Shift: Moving your hips only (not shoulders): move your weight forward onto your toes, HOLD/FEEL pressure in socket. Move your weight back to equal Flat Foot on both legs, HOLD/FEEL  pressure in socket. Move your weight back onto your heels, HOLD/FEEL  pressure in socket. Move your weight back to equal on both legs, HOLD/FEEL  pressure in socket. Repeat.  Start with both hands on sink, progress to hand on prosthetic side only, then no hands.  Moving Cones / Cups: With equal weight on each leg: Hold on with one hand the first time, then progress to no hand supports. Move cups from one side of sink to the other. Place cups ~2" out of your reach, progress to 10" beyond reach.  Place one hand in middle of sink and reach with other hand. Do both arms.  Then hover one hand and move cups with other hand.  Overhead/Upward Reaching: alternated reaching up to top cabinets or ceiling if no cabinets present. Keep equal weight on each leg. Start with one hand support on counter while other hand reaches and progress to no hand support with reaching.  ace one hand in middle of sink and reach with other hand. Do both arms.  Then hover one hand and move cups with other hand.  5.   Looking Over Shoulders: With equal weight on each leg: alternate turning to look over your shoulders with one hand support on counter as needed.  Start with head motions only to look in front of shoulder, then even with shoulder and progress to looking behind you. To look to side, move head /eyes, then shoulder on side looking pulls back, shift more weight to side looking and pull hip back. Place one hand in middle of sink and let go with other hand so your  shoulder can pull back. Switch hands to look other way.   Then hover one hand and look over shoulder. If looking right, use left hand at sink. If looking left, use right hand at sink. 6.  Alternate Tapping Toes or Stepping to lower shelf of cabinet  Move items under cabinet out of your way. Shift your hips/pelvis so weight on stance leg & Tighten muscles in hip.  SLOWLY step other leg so front of foot is in cabinet. Then step back to floor. Repeat with other leg.      ASSESSMENT:   CLINICAL IMPRESSION: This patient's left shoulder is limiting function. Due to mobility issues with LEs he relies on significant UE use.  He has pain, weakness, stiffness & pain limiting his function.  Pt would benefit from skilled PT to address left shoulder in addition to prosthetic training & function. He appears to understand posture relationship to shoulder and PT recommendations for positioning & exercises.   OBJECTIVE IMPAIRMENTS .Abnormal gait, cardiopulmonary status limiting activity, decreased activity tolerance, decreased balance, decreased endurance, decreased knowledge of use of DME, decreased mobility, decreased ROM, decreased strength, dizziness, increased edema, impaired flexibility, postural dysfunction, prosthetic dependency , obesity, and pain.    ACTIVITY LIMITATIONS carrying, lifting, bending, standing, stairs, transfers, locomotion level, UE use with ADLs, sleep    PARTICIPATION LIMITATIONS: driving, community activity, and yard work, UE use with ADLs &  household activities   [place], Time since onset of injury/illness/exacerbation, and 3+ comorbidities:  see PMH  are also affecting patient's functional outcome.    REHAB POTENTIAL: Good   CLINICAL DECISION MAKING: Evolving/moderate complexity   EVALUATION COMPLEXITY: Moderate     GOALS: Goals reviewed with patient? Yes  SHORT TERM GOALS: Target date: 07/14/2022  1. Patient able to stand with no UE support for 1 min with  supervision. Baseline: SEE OBJECTIVE DATA Goal status: ongoing 06/20/22  2. Patient independent with donning/doffing new AFO or prosthesis. Baseline: SEE OBJECTIVE DATA Goal status: ongoing 06/20/22  3. Patient demo proper sitting posture for left shoulder pain & function.  Goal status: new 06/23/2022  4. Left shoulder pain reported </= 7/10 Goal status: new 06/23/2022   LONG TERM GOALS: Target date: 08/04/2022   Patient demonstrates & verbalized understanding of prosthetic care to enable safe utilization of prosthesis. Baseline: SEE OBJECTIVE DATA Goal status: INITIAL   Patient tolerates right prosthesis & left AFO / partial foot prosthesis wear >90% of awake hours without skin or limb pain issues. Baseline: SEE OBJECTIVE DATA Goal status: INITIAL   Berg Balance >/= 20/56 and tasks of Berg with RW support >/= 45/56 to indicate lower fall risk with standing ADLs Baseline: SEE OBJECTIVE DATA Goal status: INITIAL   Patient ambulates >300' with prosthesis & LRAD modified independent Baseline: SEE OBJECTIVE DATA Goal status: INITIAL   Patient negotiates ramps, curbs & stairs with single rail with prosthesis & LRAD modified independent. Baseline: SEE OBJECTIVE DATA Goal status: INITIAL  6. Left shoulder pain </= 3/10 with functional activities.  Goal status: new 06/23/2022  7. Left shoulder AROM within 75% of Right shoulder  Goal status: new 06/23/2022  8.patient able to use LUE for ADLs, gait, transfers, etc. Safely  Goal status: new 06/23/2022     PLAN: PT FREQUENCY: 2x/week   PT DURATION: 13 weeks (90 days);   shoulder included on 06/23/2022 for 6 weeks.    PLANNED INTERVENTIONS: Therapeutic exercises, Therapeutic activity, Neuromuscular re-education, Balance training, Gait training, Patient/Family education, Self Care, Joint mobilization, Stair training, Vestibular training, Canalith repositioning, Prosthetic training, DME instructions, Dry Needling, Electrical stimulation,  Cryotherapy, Moist heat, Taping, Vasopneumatic device, Ultrasound, Manual therapy, Re-evaluation, and physical performance testing    PLAN FOR NEXT SESSION:   continue to monitor HR & SpO2 with activities, check shoulder pain & modalities as needed, update HEP for shoulder, balance activities, gait and transfers with shoe / AFO when provided   Jamey Reas, PT, DPT 06/23/2022, 12:48 PM

## 2022-06-27 ENCOUNTER — Encounter: Payer: Self-pay | Admitting: Physical Therapy

## 2022-06-27 ENCOUNTER — Ambulatory Visit (INDEPENDENT_AMBULATORY_CARE_PROVIDER_SITE_OTHER): Payer: Medicare Other | Admitting: Physical Therapy

## 2022-06-27 DIAGNOSIS — R2681 Unsteadiness on feet: Secondary | ICD-10-CM

## 2022-06-27 DIAGNOSIS — R293 Abnormal posture: Secondary | ICD-10-CM

## 2022-06-27 DIAGNOSIS — M25612 Stiffness of left shoulder, not elsewhere classified: Secondary | ICD-10-CM

## 2022-06-27 DIAGNOSIS — R2689 Other abnormalities of gait and mobility: Secondary | ICD-10-CM

## 2022-06-27 DIAGNOSIS — Z9181 History of falling: Secondary | ICD-10-CM | POA: Diagnosis not present

## 2022-06-27 DIAGNOSIS — M25512 Pain in left shoulder: Secondary | ICD-10-CM

## 2022-06-27 DIAGNOSIS — M6281 Muscle weakness (generalized): Secondary | ICD-10-CM

## 2022-06-27 DIAGNOSIS — R600 Localized edema: Secondary | ICD-10-CM

## 2022-06-27 NOTE — Therapy (Signed)
OUTPATIENT PHYSICAL THERAPY TREATMENT NOTE   Patient Name: Shane [name] MRN: [id number] DOB:[birth year], 81 y.o., male Today's Date: 06/27/2022  PCP: Burnard Bunting, MD REFERRING PROVIDER: [name], MD for TTA & [name], NP for left shoulder   END OF SESSION:   PT End of Session - 06/27/22 0804     Visit Number 13    Number of Visits 26    Date for PT Re-Evaluation 08/04/22    Authorization Type UHC Medicare    Authorization Time Period [facility] MEDICARE  $20 COPAY    Progress Note Due on Visit 20    PT Start Time 0802    PT Stop Time 0845    PT Time Calculation (min) 43 min    Equipment Utilized During Treatment --    Activity Tolerance Patient tolerated treatment well;Patient limited by fatigue;Patient limited by pain    Behavior During Therapy WFL for tasks assessed/performed             Past Medical History:  Diagnosis Date   Anxiety    AV block, Mobitz 1    Cataract    Chronic kidney disease    d/t DM   CKD (chronic kidney disease), stage III ([place])    Depression    Diabetes mellitus    Vgo disposal insulin bolus  simular to insulin pump   Dyspnea    GERD (gastroesophageal reflux disease)    History of kidney stones    passed   Hyperlipidemia    Hypertension    Idiopathic pulmonary fibrosis ([place]) 11/2016   ILD (interstitial lung disease) ([place])    Moderate aortic stenosis    a. 10/2019 Echo: EF 55-60%, Gr2 DD. Nl RV.    Neuromuscular disorder ([place])    Neuropathy associated with endocrine disorder ([place])    Nonobstructive CAD (coronary artery disease)    a. 2012 Cath: mod, nonobs dzs; b. 10/2016 MV: EF 60%, no ischemia.   OSA on CPAP 05/05/2017   Unattended Home Sleep Test 7/2/813-AHI 38.6/hour, desaturation to 64%, body weight 261 pounds   PONV (postoperative nausea and vomiting)    Sleep apnea     uses cpap asked to bring mask and tubing   Past Surgical History:  Procedure Laterality Date   ABDOMINAL AORTOGRAM W/LOWER EXTREMITY N/A  12/10/2020   Procedure: ABDOMINAL AORTOGRAM W/LOWER EXTREMITY;  Surgeon: Cherre Robins, MD;  Location: [place] [facility];  Service: Cardiovascular;  Laterality: N/A;   AMPUTATION Right 01/22/2021   Procedure: 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;  Surgeon: [name], MD;  Location: [place];  Service: Orthopedics;  Laterality: Right;   AMPUTATION Right 03/17/2021   Procedure: RIGHT BELOW KNEE AMPUTATION;  Surgeon: [name], MD;  Location: [place];  Service: Orthopedics;  Laterality: Right;   ANKLE FUSION Right 01/22/2021   Procedure: RIGHT FOOT TIBIOCALCANEAL FUSION;  Surgeon: [name], MD;  Location: [place];  Service: Orthopedics;  Laterality: Right;   ANTERIOR FUSION CERVICAL SPINE  2012   CARDIAC CATHETERIZATION  2011   CARDIAC CATHETERIZATION N/A 11/09/2016   Procedure: Right Heart Cath;  Surgeon: Belva Crome, MD;  Location: [place] [facility];  Service: Cardiovascular;  Laterality: N/A;   carpel tunnel     left wrist   CATARACT EXTRACTION     CATARACT EXTRACTION W/ INTRAOCULAR LENS  IMPLANT, BILATERAL  2013   CERVICAL LAMINECTOMY  2012   COLONOSCOPY N/A 01/14/2013   Procedure: COLONOSCOPY;  Surgeon: Irene Shipper, MD;  Location: [facility];  Service: Endoscopy;  Laterality: N/A;   CORONARY ARTERY BYPASS GRAFT  11/04/2020   LIMA-LAD, SVG-OM1, SVG-PDA (Dr Marney Setting [facility]) dc 11/18/2020   EYE SURGERY     I & D EXTREMITY Right 02/19/2021   Procedure: RIGHT ANKLE DEBRIDEMENT AND PLACEMENT ANTIBIOTIC BEADS;  Surgeon: [name], MD;  Location: [place];  Service: Orthopedics;  Laterality: Right;   KNEE SURGERY  1998   left   LEFT HEART CATH AND CORONARY ANGIOGRAPHY N/A 07/10/2020   Procedure: LEFT HEART CATH AND CORONARY ANGIOGRAPHY;  Surgeon: Sherren Mocha, MD;  Location: [place] [facility];  Service: Cardiovascular;  Laterality: N/A;   LUMBAR LAMINECTOMY  2003   LUNG BIOPSY Left 12/26/2016   Procedure: LUNG BIOPSY;  Surgeon: [name], MD;  Location: [place];  Service: Thoracic;   Laterality: Left;   PACEMAKER IMPLANT N/A 03/30/2020   Procedure: PACEMAKER IMPLANT;  Surgeon: Evans Lance, MD;  Location: [place] [facility];  Service: Cardiovascular;  Laterality: N/A;   PERIPHERAL VASCULAR INTERVENTION Right 12/10/2020   Procedure: PERIPHERAL VASCULAR INTERVENTION;  Surgeon: Cherre Robins, MD;  Location: [place] [facility];  Service: Cardiovascular;  Laterality: Right;  SFA   POSTERIOR FUSION CERVICAL SPINE  2012   PPM GENERATOR REMOVAL N/A 12/14/2020   Procedure: PPM GENERATOR REMOVAL;  Surgeon: Evans Lance, MD;  Location: [place] [facility];  Service: Cardiovascular;  Laterality: N/A;   TEE WITHOUT CARDIOVERSION N/A 12/11/2020   Procedure: TRANSESOPHAGEAL ECHOCARDIOGRAM (TEE);  Surgeon: Geralynn Rile, MD;  Location: [place];  Service: Cardiovascular;  Laterality: N/A;   TRIGGER FINGER RELEASE  2011   4th finger left hand   VIDEO ASSISTED THORACOSCOPY Left 12/26/2016   Procedure: VIDEO ASSISTED THORACOSCOPY;  Surgeon: [name], MD;  Location: [place];  Service: Thoracic;  Laterality: Left;   VIDEO BRONCHOSCOPY N/A 12/26/2016   Procedure: VIDEO BRONCHOSCOPY;  Surgeon: [name], MD;  Location: [place];  Service: Thoracic;  Laterality: N/A;   Patient Active Problem List   Diagnosis Date Noted   Intermediate stage nonexudative age-related macular degeneration of both eyes 04/25/2022   Research study patient 12/24/2021   Post-operative pain    CKD (chronic kidney disease), stage II    Labile blood glucose    Pulmonary fibrosis ([place])    Hyponatremia    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[place]) 03/26/2021   S/P BKA (below knee amputation) unilateral, right ([place])    Shortness of breath    Acute blood loss anemia    Stage 3 chronic kidney disease ([place])    Atrial fibrillation ([place])    Controlled type 2 diabetes mellitus with hyperglycemia, with long-term current use of insulin (H[facility])    Postoperative pain    Postoperative hemorrhagic shock  03/20/2021   Atherosclerosis of native arteries of extremities with gangrene, right leg ([place])    Hardware complicating wound infection ([place])    Abscess of bursa of right ankle 02/15/2021   Endocarditis 01/19/2021   Peripheral vascular disease ([place]) 12/29/2020   CHF (congestive heart failure), NYHA class II, chronic, diastolic ([place]) 09/81/1914   History of COVID-19 12/22/2020   SSS (sick sinus syndrome) ([place]) 12/22/2020   Acute bacterial endocarditis    MSSA bacteremia 12/09/2020   Diabetic foot ulcer ([place]) 12/09/2020   Foot drop, right foot    Generalized weakness 12/08/2020   Dehydration with hyponatremia 12/08/2020   Atrial fibrillation, chronic ([place]) 12/08/2020   Elevated troponin level not due myocardial  infarction 12/08/2020   Adrenal insufficiency ([place]) 11/14/2020   Orthostatic hypotension 11/14/2020   Physical deconditioning 11/14/2020   Difficulty sleeping 11/07/2020   Postoperative anemia due to acute blood loss 11/07/2020   Right ventricular dysfunction 11/05/2020   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/04/2020   Hearing loss 11/03/2020   Cardiac device in situ 09/09/2020   Coronary atherosclerosis due to lipid rich plaque 09/02/2020   COVID-19 virus infection 07/18/2020   Coronary artery disease involving native heart without angina pectoris 07/10/2020   Chronic kidney disease, stage 3a ([place]) 03/29/2020   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/28/2020   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 ([place]) 03/16/2020   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 ([place]) 03/16/2020   Right epiretinal membrane 03/16/2020   Posterior vitreous detachment of right eye 03/16/2020   Aortic stenosis, moderate 03/12/2020   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   Near syncope 04/27/2018   Hypoglycemia due to insulin 01/06/2018   Essential hypertension 01/06/2018   Diabetes mellitus type 2, with  complication, on long term insulin pump ([place]) 01/06/2018   Syncope and collapse 01/05/2018   Encounter for therapeutic drug monitoring 06/29/2017   OSA on CPAP 05/05/2017   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[place]) 01/05/2017   Abnormal chest x-ray 10/12/2016   Benign neoplasm of colon 01/14/2013    REFERRING DIAG: Z89.511 (ICD-10-CM) - Hx of right BKA  ONSET DATE: 05/04/2022 MD visit with release to WB on LLE  THERAPY DIAG:  Unsteadiness on feet  Other abnormalities of gait and mobility  Muscle weakness (generalized)  History of falling  Abnormal posture  Acute pain of left shoulder  Stiffness of left shoulder, not elsewhere classified  Localized edema  Rationale for Evaluation and Treatment Rehabilitation  PERTINENT HISTORY: CKD st III, AV block Mobitz, pacemaker, pulmonary fibrosis, CAD, CABG 21, HTN, A-Fib, DM2, PAD, right BKA, Left TMA  PRECAUTIONS: ICD/Pacemaker - continue monitoring vitals with activity and calculated HRmax is 151 bpm  SUBJECTIVE: He reports that the exercises have helped pain significantly thus far. He uses a pole to assist with bed mobility and sit to/from stand that aggravates Lt shoulder pain.  PAIN:    Are you having pain? Yes: NPRS scale: today 2/10, over last week lowest 0/10 and highest 7-8/10 Pain location: left shoulder lateral just distal to joint Pain description: at rest dull ache and movement sharp Aggravating factors: moving left shoulder from position of hanging by side Relieving factors: supporting arm against chest or on armrest    OBJECTIVE: (objective measures completed at initial evaluation unless otherwise dated)  COGNITION: Overall cognitive status: Within functional limits for tasks assessed   POSTURE: 05/09/2022:  rounded shoulders, forward head, and flexed trunk    LOWER EXTREMITY ROM:    UPPER EXTREMITY ROM:  ROM P:passive  A:active Left 06/23/22 Supine all limited by pain  Shoulder flexion 30*  Shoulder  extension 0* neutral  Shoulder abduction 45*  Shoulder adduction   Shoulder internal rotation 20*  Shoulder external rotation 10*  Elbow flexion   Elbow extension -15*  Wrist flexion   Wrist extension   Wrist ulnar deviation   Wrist radial deviation   Wrist pronation   Wrist supination    (Blank rows = not tested)    ROM P:passive  A:active Right Eval 05/09/2022 Left Eval 05/09/2022  Hip flexion      Hip extension A: -25* standing A: -25* standing  Hip abduction      Hip adduction      Hip internal rotation      Hip external rotation      Knee flexion      Knee extension      Ankle dorsiflexion NA P: -17*  Ankle plantarflexion NA    Ankle inversion NA    Ankle eversion NA     (Blank rows = not tested)   LOWER EXTREMITY MMT:   MMT Right Eval 05/09/2022 Left Eval 05/09/2022  Hip flexion      Hip extension Functional testing 3-/5 Functional testing 3-/5  Hip abduction Functional testing 3-/5 Functional testing 3-/5  Hip adduction      Hip internal rotation      Hip external rotation      Knee flexion Functional testing 3-/5 Functional testing 3-/5  Knee extension Functional testing 3/5 Functional testing 3/5  Ankle dorsiflexion      Ankle plantarflexion      Ankle inversion      Ankle eversion      (Blank rows = not tested)   POSTURE:  06/23/2022  seated - head forward, rounded shoulders and posterior pelvic tilt  TRANSFERS: 05/09/2022:  Sit to stand: SBA and requires use of armrests from 20" stable w/c & needs RW for stabilization 05/09/2022:  Stand to sit: SBA and requires use of armrests from 20" stable w/c & needs RW for stabilization   GAIT: 05/09/2022:  Gait pattern: step to pattern, decreased step length- Left, decreased stance time- Right, Right hip hike, antalgic, lateral hip instability, trunk flexed, and wide BOS Distance walked: 25' Assistive device utilized: Environmental consultant - 2 wheeled and TTA prosthesis RLE Level of assistance: Min A Comments: pt reports  dizziness with turning in gait   UE FUNCTION: 06/23/2022:  Pt reports that he uses RUE for fine motor like writing but uses both hands for gross motor or sports.  Seated - pt unable to raise LUE in flexion or abd due to pain.  Supine - pt unable to move LUE in flexion or abd due to pain.  PROM flexion > pain than abd   Pt tolerates elbow ext with palm supinated but with palm neutral causes shoulder pain. Also painful with elbow already extended and pronate palm.   PT simulated pushing on RW thru LUE with manual resistance.  Pt reports slight increase in pain but not as sharp as above function.    FUNCTIONAL TESTs:  Merrilee Jansky Balance Scale: 05/09/2022:  9/56 & tasks of Berg with RW support 27/56    [facility] PT Assessment - 05/09/22 0930                Berg Balance Test    Sit to Stand Needs moderate or maximal assist to stand   task with RW support = 3    Standing Unsupported Unable to stand 30 seconds unassisted   task with RW support = 3    Sitting with Back Unsupported but Feet Supported on Floor or Stool Able to sit safely and securely 2 minutes     Stand to Sit Uses backs of legs against chair to control descent   task with RW support = 3    Transfers Able to transfer safely, definite need of hands     Standing Unsupported with Eyes Closed Needs help to keep from falling   task with RW support = 3    Standing Unsupported with Feet Together Needs help to attain position and  unable to hold for 15 seconds   task with RW support = 2    From Standing, Reach Forward with Outstretched Arm Loses balance while trying/requires external support   task with RW support = 1    From Standing Position, Pick up Object from Floor Unable to try/needs assist to keep balance   task with RW support = 2    From Standing Position, Turn to Look Behind Over each Shoulder Needs assist to keep from losing balance and falling   task with RW support = 1    Turn 360 Degrees Needs assistance while turning   task with RW support  = 0    Standing Unsupported, Alternately Place Feet on Step/Stool Needs assistance to keep from falling or unable to try   task with RW support = 0    Standing Unsupported, One Foot in [employer] balance while stepping or standing   task with RW support = 1    Standing on One Leg Unable to try or needs assist to prevent fall   task with RW support = 1    Total Score 9     Berg comment: tasks of Berg with RW support 27/56                     CURRENT PROSTHETIC WEAR ASSESSMENT: 05/09/2022:   Patient is dependent with: skin check, residual limb care, care of non-amputated limb, prosthetic cleaning, ply sock cleaning, and correct ply sock adjustment Donning prosthesis: Modified independence Doffing prosthesis: Modified independence Prosthetic wear tolerance: >90% of awake hours/day, 7 days/week Prosthetic weight bearing tolerance: 5 minutes Edema: LLE pitting edema with >10 sec capillary refill Residual limb condition: Right BKA limb has no openings, normal temperature & moisture.  Redness patella & patella tendon from friction of liner with knee flexed. Cylinderical shape.  LLE TMA pt's wife showed picture of wound on dorsum of foot that is being managed by PCP.   Prosthetic description: silicon liner with pin lock suspension, total contact socket, dynamic response foot.      TODAY'S TREATMENT:  06/27/2022 TherAct: -Stand pivot transfer w/c to/from tall table height x 6 with RW UE support, verbal cueing and demo for step order with RW. PT instructed in using RW improves his technique & safety.  -Sit to stand transfer x 8, verbal cueing and demo for weight shift and technique with RW -pt demo his technique for sit<>supine which was technique that would cause orthopedic issues like his shoulder.  PT verbal and tactile cueing and demonstration for rolling with residual limb, momentum with leg swing, pushing up with both arms to protect shoulder;  pt able to return demo Log roll bed mobility  sit to/from supine technique x 2 ea,   TherEx: PT demo & verbal cues on pendulum for left shoulder  - pt return demo Standing Lt shoulder pendulums at sink Rt forearm support A-P, M-L, circles ea. x 20s, pt education to add 30-60s ea. 2-3x/daily with HEP. Pt verbalized understanding.   06/23/2022 PT educated pt & wife on upper body posture & relationship to shoulder function / pain. PT demo & verbal cues on positioning LUE seated & supine. Pt & wife verbalized understanding.    06/23/22 1413  PT Education  Education Details Access Code: [email or web address]  Person(s) Educated Patient;Spouse  Methods Explanation;Handout;Demonstration;Tactile cues;Verbal cues  Comprehension Verbalized understanding;Returned demonstration;Verbal cues required;Tactile cues required;Need further instruction    Access Code: [email or web address] URL: [email or web address] Date: 06/23/2022 Prepared by: Shirlean Mylar  Jone Baseman  Exercises - Seated Correct Posture  - 1 x daily - 7 x weekly - Seated Scapular Retraction  - 2-5 x daily - 7 x weekly - 1 sets - 10 reps - 5 seconds hold - Supine Chin Tucks on Flat Ball  - 2-5 x daily - 7 x weekly - 1 sets - 10 reps - 5 seconds hold - Seated Isometric Cervical Retraction with Chin Tucks with Pillow behind head  - 2-5 x daily - 7 x weekly - 1 sets - 10 reps - 5 seconds hold  06/20/2022 PT using 1/2" lift on LLE for activities.  PT limiting gait until he has AFO / partial foot prosthesis on LLE to avoid skin issues Therapeutic Exercise: -Leg Press back 45* BLEs 131# 25 reps 1 sets,  single LE 62# 10 reps 2 sets ea;  Back flat LEs 118# 25 reps  1 set,  single LE 56# 10 reps 2 sets  Neuromuscular Re-ed Green band and seat on 24" bar stool with no back support. PT using 1/2" lift on LLE for activities.  PT limiting gait until he has AFO / partial foot prosthesis on LLE to avoid skin issues -standing alternating banded rows 1 x 10 reps, verbal cueing for upright posture and pt feeling  wobbly -seated BUE banded scapular rows 1 x 8 reps, HR 135 SpO2 94% then after 5 min seated rest HR 74 SpO2 94% -standing alternating banded forward press 1 x 5 reps -standing BUE banded forward press 1 x 5 reps, HR 138 SpO2 99% then after 3 min seated rest HR 93 SpO2 97% PT reviewed addition of seated green band UE exercise to HEP with verbal instructions, demo, and safety recommendations. Pt verbalized understanding and returned demo  -seated alternating banded rows 1 x 3 reps -seated BUE banded scapular rows 1 x 6 reps -standing alternating banded forward press 1 x 3 reps -standing BUE banded forward press 1 x 3 reps -seated alternating banded bicep curls + 90* shoulder flexion 1 x 10 reps -seated alternating banded trunk rotation 1 x 5 reps    05/11/22 1636  PT Education  Education Details HEP at sink & sitting on bar stool to develop back strength / endurance  Person(s) Educated Patient;Spouse  Methods Explanation;Demonstration;Tactile cues;Verbal cues;Handout  Comprehension Verbalized understanding;Returned demonstration;Verbal cues required;Tactile cues required;Need further instruction   Neuromuscular Reed-  cues on proprioception RLE using residual limb, balance & standing tolerance.   Do each exercise 1-2  times per day Do each exercise 5-10 repetitions Hold each exercise for 2 seconds to feel your location  AT [place].  Try to find this position when standing still for activities.   USE TAPE ON FLOOR TO MARK THE MIDLINE POSITION which is even with middle of sink.  You also should try to feel with your limb pressure in socket.  You are trying to feel with limb what you used to feel with the bottom of your foot.  Side to Side Shift: Moving your hips only (not shoulders): move weight onto your left leg, HOLD/FEEL pressure in socket.  Move back to equal weight on each leg, HOLD/FEEL pressure in socket. Move  weight onto your right leg, HOLD/FEEL pressure in socket. Move back to equal weight on each leg, HOLD/FEEL pressure in socket. Repeat.  Start with both hands on sink, progress to hand on prosthetic side only, then no hands.  Front to Back  Shift: Moving your hips only (not shoulders): move your weight forward onto your toes, HOLD/FEEL pressure in socket. Move your weight back to equal Flat Foot on both legs, HOLD/FEEL  pressure in socket. Move your weight back onto your heels, HOLD/FEEL  pressure in socket. Move your weight back to equal on both legs, HOLD/FEEL  pressure in socket. Repeat.  Start with both hands on sink, progress to hand on prosthetic side only, then no hands.  Moving Cones / Cups: With equal weight on each leg: Hold on with one hand the first time, then progress to no hand supports. Move cups from one side of sink to the other. Place cups ~2" out of your reach, progress to 10" beyond reach.  Place one hand in middle of sink and reach with other hand. Do both arms.  Then hover one hand and move cups with other hand.  Overhead/Upward Reaching: alternated reaching up to top cabinets or ceiling if no cabinets present. Keep equal weight on each leg. Start with one hand support on counter while other hand reaches and progress to no hand support with reaching.  ace one hand in middle of sink and reach with other hand. Do both arms.  Then hover one hand and move cups with other hand.  5.   Looking Over Shoulders: With equal weight on each leg: alternate turning to look over your shoulders with one hand support on counter as needed.  Start with head motions only to look in front of shoulder, then even with shoulder and progress to looking behind you. To look to side, move head /eyes, then shoulder on side looking pulls back, shift more weight to side looking and pull hip back. Place one hand in middle of sink and let go with other hand so your shoulder can pull back. Switch hands to look other way.    Then hover one hand and look over shoulder. If looking right, use left hand at sink. If looking left, use right hand at sink. 6.  Alternate Tapping Toes or Stepping to lower shelf of cabinet  Move items under cabinet out of your way. Shift your hips/pelvis so weight on stance leg & Tighten muscles in hip.  SLOWLY step other leg so front of foot is in cabinet. Then step back to floor. Repeat with other leg.      ASSESSMENT:   CLINICAL IMPRESSION: He relies heavily on UE use for transfers and mobility, which aggravates left shoulder pain. Today's session included education and repetition of sit to/from stand, stand pivot transfer with RW, and sit to/from supine bed mobility in order to improve safe functional mobility with reduced UE reliance. He reports that the postural exercise have begun to improve shoulder pain and was advised to include pendulums to HEP. He continues to benefit from skilled PT to address left shoulder impairments and prosthetic training.  OBJECTIVE IMPAIRMENTS .Abnormal gait, cardiopulmonary status limiting activity, decreased activity tolerance, decreased balance, decreased endurance, decreased knowledge of use of DME, decreased mobility, decreased ROM, decreased strength, dizziness, increased edema, impaired flexibility, postural dysfunction, prosthetic dependency , obesity, and pain.    ACTIVITY LIMITATIONS carrying, lifting, bending, standing, stairs, transfers, locomotion level, UE use with ADLs, sleep    PARTICIPATION LIMITATIONS: driving, community activity, and yard work, UE use with ADLs &  household activities   [place], Time since onset of injury/illness/exacerbation, and 3+ comorbidities: see PMH  are also affecting patient's functional outcome.    REHAB POTENTIAL: Good  CLINICAL DECISION MAKING: Evolving/moderate complexity   EVALUATION COMPLEXITY: Moderate     GOALS: Goals reviewed with patient? Yes  SHORT TERM GOALS: Target date:  07/14/2022  1. Patient able to stand with no UE support for 1 min with supervision. Baseline: SEE OBJECTIVE DATA Goal status: ongoing 06/20/22  2. Patient independent with donning/doffing new AFO or prosthesis. Baseline: SEE OBJECTIVE DATA Goal status: ongoing 06/20/22  3. Patient demo proper sitting posture for left shoulder pain & function.  Goal status: new 06/23/2022  4. Left shoulder pain reported </= 7/10 Goal status: new 06/23/2022   LONG TERM GOALS: Target date: 08/04/2022   Patient demonstrates & verbalized understanding of prosthetic care to enable safe utilization of prosthesis. Baseline: SEE OBJECTIVE DATA Goal status: INITIAL   Patient tolerates right prosthesis & left AFO / partial foot prosthesis wear >90% of awake hours without skin or limb pain issues. Baseline: SEE OBJECTIVE DATA Goal status: INITIAL   Berg Balance >/= 20/56 and tasks of Berg with RW support >/= 45/56 to indicate lower fall risk with standing ADLs Baseline: SEE OBJECTIVE DATA Goal status: INITIAL   Patient ambulates >300' with prosthesis & LRAD modified independent Baseline: SEE OBJECTIVE DATA Goal status: INITIAL   Patient negotiates ramps, curbs & stairs with single rail with prosthesis & LRAD modified independent. Baseline: SEE OBJECTIVE DATA Goal status: INITIAL  6. Left shoulder pain </= 3/10 with functional activities.  Goal status: new 06/23/2022  7. Left shoulder AROM within 75% of Right shoulder  Goal status: new 06/23/2022  8.patient able to use LUE for ADLs, gait, transfers, etc. Safely  Goal status: new 06/23/2022     PLAN: PT FREQUENCY: 2x/week   PT DURATION: 13 weeks (90 days);   shoulder included on 06/23/2022 for 6 weeks.    PLANNED INTERVENTIONS: Therapeutic exercises, Therapeutic activity, Neuromuscular re-education, Balance training, Gait training, Patient/Family education, Self Care, Joint mobilization, Stair training, Vestibular training, Canalith repositioning,  Prosthetic training, DME instructions, Dry Needling, Electrical stimulation, Cryotherapy, Moist heat, Taping, Vasopneumatic device, Ultrasound, Manual therapy, Re-evaluation, and physical performance testing    PLAN FOR NEXT SESSION:  continue to monitor HR & SpO2 with activities, check bed mobility and transfers as indicated, update HEP for shoulder and modalities as needed, standing balance activities, gait and transfers with shoe / AFO when provided   Jana Hakim, Student-PT 06/27/2022, 10:36 AM  This entire session of physical therapy was performed under the direct supervision of PT signing evaluation /treatment. PT reviewed note and agrees.  Jamey Reas, PT, DPT 06/27/2022, 11:39 AM

## 2022-06-30 ENCOUNTER — Encounter: Payer: Self-pay | Admitting: Physical Therapy

## 2022-06-30 ENCOUNTER — Ambulatory Visit (INDEPENDENT_AMBULATORY_CARE_PROVIDER_SITE_OTHER): Payer: Medicare Other | Admitting: Physical Therapy

## 2022-06-30 DIAGNOSIS — R600 Localized edema: Secondary | ICD-10-CM

## 2022-06-30 DIAGNOSIS — R2689 Other abnormalities of gait and mobility: Secondary | ICD-10-CM | POA: Diagnosis not present

## 2022-06-30 DIAGNOSIS — Z9181 History of falling: Secondary | ICD-10-CM

## 2022-06-30 DIAGNOSIS — M6281 Muscle weakness (generalized): Secondary | ICD-10-CM

## 2022-06-30 DIAGNOSIS — R293 Abnormal posture: Secondary | ICD-10-CM

## 2022-06-30 DIAGNOSIS — R2681 Unsteadiness on feet: Secondary | ICD-10-CM

## 2022-06-30 DIAGNOSIS — M25512 Pain in left shoulder: Secondary | ICD-10-CM

## 2022-06-30 DIAGNOSIS — M25612 Stiffness of left shoulder, not elsewhere classified: Secondary | ICD-10-CM

## 2022-06-30 NOTE — Therapy (Signed)
OUTPATIENT PHYSICAL THERAPY TREATMENT NOTE   Patient Name: Shane [name] MRN: [id number] DOB:[birth year], 81 y.o., male Today's Date: 06/30/2022  PCP: Burnard Bunting, MD REFERRING PROVIDER: [name], MD for TTA & [name], NP for left shoulder   END OF SESSION:   PT End of Session - 06/30/22 0804     Visit Number 14    Number of Visits 26    Date for PT Re-Evaluation 08/04/22    Authorization Type UHC Medicare    Authorization Time Period [facility] MEDICARE  $20 COPAY    Progress Note Due on Visit 20    PT Start Time 0800    PT Stop Time 0844    PT Time Calculation (min) 44 min    Equipment Utilized During Treatment Gait belt    Activity Tolerance Patient tolerated treatment well;Patient limited by fatigue    Behavior During Therapy WFL for tasks assessed/performed              Past Medical History:  Diagnosis Date   Anxiety    AV block, Mobitz 1    Cataract    Chronic kidney disease    d/t DM   CKD (chronic kidney disease), stage III ([place])    Depression    Diabetes mellitus    Vgo disposal insulin bolus  simular to insulin pump   Dyspnea    GERD (gastroesophageal reflux disease)    History of kidney stones    passed   Hyperlipidemia    Hypertension    Idiopathic pulmonary fibrosis ([place]) 11/2016   ILD (interstitial lung disease) ([place])    Moderate aortic stenosis    a. 10/2019 Echo: EF 55-60%, Gr2 DD. Nl RV.    Neuromuscular disorder ([place])    Neuropathy associated with endocrine disorder ([place])    Nonobstructive CAD (coronary artery disease)    a. 2012 Cath: mod, nonobs dzs; b. 10/2016 MV: EF 60%, no ischemia.   OSA on CPAP 05/05/2017   Unattended Home Sleep Test 7/2/813-AHI 38.6/hour, desaturation to 64%, body weight 261 pounds   PONV (postoperative nausea and vomiting)    Sleep apnea     uses cpap asked to bring mask and tubing   Past Surgical History:  Procedure Laterality Date   ABDOMINAL AORTOGRAM W/LOWER EXTREMITY N/A 12/10/2020    Procedure: ABDOMINAL AORTOGRAM W/LOWER EXTREMITY;  Surgeon: Cherre Robins, MD;  Location: [facility];  Service: Cardiovascular;  Laterality: N/A;   AMPUTATION Right 01/22/2021   Procedure: 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;  Surgeon: [name], MD;  Location: [facility];  Service: Orthopedics;  Laterality: Right;   AMPUTATION Right 03/17/2021   Procedure: RIGHT BELOW KNEE AMPUTATION;  Surgeon: [name], MD;  Location: [facility];  Service: Orthopedics;  Laterality: Right;   ANKLE FUSION Right 01/22/2021   Procedure: RIGHT FOOT TIBIOCALCANEAL FUSION;  Surgeon: [name], MD;  Location: [facility];  Service: Orthopedics;  Laterality: Right;   ANTERIOR FUSION CERVICAL SPINE  2012   CARDIAC CATHETERIZATION  2011   CARDIAC CATHETERIZATION N/A 11/09/2016   Procedure: Right Heart Cath;  Surgeon: Belva Crome, MD;  Location: [facility];  Service: Cardiovascular;  Laterality: N/A;   carpel tunnel     left wrist   CATARACT EXTRACTION     CATARACT EXTRACTION W/ INTRAOCULAR LENS  IMPLANT, BILATERAL  2013   CERVICAL LAMINECTOMY  2012   COLONOSCOPY N/A 01/14/2013   Procedure: COLONOSCOPY;  Surgeon: Irene Shipper, MD;  Location:  [facility];  Service: Endoscopy;  Laterality: N/A;   CORONARY ARTERY BYPASS GRAFT  11/04/2020   LIMA-LAD, SVG-OM1, SVG-PDA (Dr Marney [facility]) dc 11/18/2020   EYE SURGERY     I & D EXTREMITY Right 02/19/2021   Procedure: RIGHT ANKLE DEBRIDEMENT AND PLACEMENT ANTIBIOTIC BEADS;  Surgeon: [name], MD;  Location: [facility];  Service: Orthopedics;  Laterality: Right;   KNEE SURGERY  1998   left   LEFT HEART CATH AND CORONARY ANGIOGRAPHY N/A 07/10/2020   Procedure: LEFT HEART CATH AND CORONARY ANGIOGRAPHY;  Surgeon: Sherren Mocha, MD;  Location: [facility];  Service: Cardiovascular;  Laterality: N/A;   LUMBAR LAMINECTOMY  2003   LUNG BIOPSY Left 12/26/2016   Procedure: LUNG BIOPSY;  Surgeon: [name], MD;  Location: [facility];  Service: Thoracic;   Laterality: Left;   PACEMAKER IMPLANT N/A 03/30/2020   Procedure: PACEMAKER IMPLANT;  Surgeon: Evans Lance, MD;  Location: [place] [facility];  Service: Cardiovascular;  Laterality: N/A;   PERIPHERAL VASCULAR INTERVENTION Right 12/10/2020   Procedure: PERIPHERAL VASCULAR INTERVENTION;  Surgeon: Cherre Robins, MD;  Location: [facility];  Service: Cardiovascular;  Laterality: Right;  SFA   POSTERIOR FUSION CERVICAL SPINE  2012   PPM GENERATOR REMOVAL N/A 12/14/2020   Procedure: PPM GENERATOR REMOVAL;  Surgeon: Evans Lance, MD;  Location: [facility];  Service: Cardiovascular;  Laterality: N/A;   TEE WITHOUT CARDIOVERSION N/A 12/11/2020   Procedure: TRANSESOPHAGEAL ECHOCARDIOGRAM (TEE);  Surgeon: Geralynn Rile, MD;  Location: [facility];  Service: Cardiovascular;  Laterality: N/A;   TRIGGER FINGER RELEASE  2011   4th finger left hand   VIDEO ASSISTED THORACOSCOPY Left 12/26/2016   Procedure: VIDEO ASSISTED THORACOSCOPY;  Surgeon: [name], MD;  Location: [facility];  Service: Thoracic;  Laterality: Left;   VIDEO BRONCHOSCOPY N/A 12/26/2016   Procedure: VIDEO BRONCHOSCOPY;  Surgeon: [name], MD;  Location: [facility];  Service: Thoracic;  Laterality: N/A;   Patient Active Problem List   Diagnosis Date Noted   Intermediate stage nonexudative age-related macular degeneration of both eyes 04/25/2022   Research study patient 12/24/2021   Post-operative pain    CKD (chronic kidney disease), stage II    Labile blood glucose    Pulmonary fibrosis ([place])    Hyponatremia    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[place]) 03/26/2021   S/P BKA (below knee amputation) unilateral, right ([place])    Shortness of breath    Acute blood loss anemia    Stage 3 chronic kidney disease ([place])    Atrial fibrillation ([place])    Controlled type 2 diabetes mellitus with hyperglycemia, with long-term current use of insulin ([facility])    Postoperative pain    Postoperative hemorrhagic shock  03/20/2021   Atherosclerosis of native arteries of extremities with gangrene, right leg ([place])    Hardware complicating wound infection ([place])    Abscess of bursa of right ankle 02/15/2021   Endocarditis 01/19/2021   Peripheral vascular disease ([place]) 12/29/2020   CHF (congestive heart failure), NYHA class II, chronic, diastolic ([place]) 67/61/9509   History of COVID-19 12/22/2020   SSS (sick sinus syndrome) ([place]) 12/22/2020   Acute bacterial endocarditis    MSSA bacteremia 12/09/2020   Diabetic foot ulcer ([place]) 12/09/2020   Foot drop, right foot    Generalized weakness 12/08/2020   Dehydration with hyponatremia 12/08/2020   Atrial fibrillation, chronic ([place]) 12/08/2020   Elevated troponin level not due myocardial infarction  12/08/2020   Adrenal insufficiency (H[facility]) 11/14/2020   Orthostatic hypotension 11/14/2020   Physical deconditioning 11/14/2020   Difficulty sleeping 11/07/2020   Postoperative anemia due to acute blood loss 11/07/2020   Right ventricular dysfunction 11/05/2020   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/04/2020   Hearing loss 11/03/2020   Cardiac device in situ 09/09/2020   Coronary atherosclerosis due to lipid rich plaque 09/02/2020   COVID-19 virus infection 07/18/2020   Coronary artery disease involving native heart without angina pectoris 07/10/2020   Chronic kidney disease, stage 3a ([place]) 03/29/2020   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/28/2020   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 ([place]) 03/16/2020   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 ([place]) 03/16/2020   Right epiretinal membrane 03/16/2020   Posterior vitreous detachment of right eye 03/16/2020   Aortic stenosis, moderate 03/12/2020   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   Near syncope 04/27/2018   Hypoglycemia due to insulin 01/06/2018   Essential hypertension 01/06/2018   Diabetes mellitus type 2, with  complication, on long term insulin pump ([place]) 01/06/2018   Syncope and collapse 01/05/2018   Encounter for therapeutic drug monitoring 06/29/2017   OSA on CPAP 05/05/2017   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[place]) 01/05/2017   Abnormal chest x-ray 10/12/2016   Benign neoplasm of colon 01/14/2013    REFERRING DIAG: Z89.511 (ICD-10-CM) - Hx of right BKA  ONSET DATE: 05/04/2022 MD visit with release to [facility] on LLE  THERAPY DIAG:  Unsteadiness on feet  Other abnormalities of gait and mobility  Muscle weakness (generalized)  History of falling  Abnormal posture  Acute pain of left shoulder  Stiffness of left shoulder, not elsewhere classified  Localized edema  Rationale for Evaluation and Treatment Rehabilitation  PERTINENT HISTORY: CKD st III, AV block Mobitz, pacemaker, pulmonary fibrosis, CAD, CABG 21, HTN, A-Fib, DM2, PAD, right BKA, Left TMA  PRECAUTIONS: ICD/Pacemaker - continue monitoring vitals with activity and calculated HRmax is 151 bpm  SUBJECTIVE: His shoulder is doing better.  The shoulder exercises are going well and he is more aware of his posture.   PAIN:    Are you having pain? Yes: NPRS scale: today 0/10, over last week lowest 0/10 and highest 1-2/10 Pain location: left shoulder lateral just distal to joint Pain description: tingling when shoulder in improper posture. Aggravating factors: improper posture. Relieving factors: correcting posture   OBJECTIVE: (objective measures completed at initial evaluation unless otherwise dated)  COGNITION: Overall cognitive status: Within functional limits for tasks assessed   POSTURE: 05/09/2022:  rounded shoulders, forward head, and flexed trunk    LOWER EXTREMITY ROM:    UPPER EXTREMITY ROM:  ROM P:passive  A:active Left 06/23/22 Supine all limited by pain  Shoulder flexion 30*  Shoulder extension 0* neutral  Shoulder abduction 45*  Shoulder adduction   Shoulder internal rotation 20*  Shoulder external  rotation 10*  Elbow flexion   Elbow extension -15*  Wrist flexion   Wrist extension   Wrist ulnar deviation   Wrist radial deviation   Wrist pronation   Wrist supination    (Blank rows = not tested)    ROM P:passive  A:active Right Eval 05/09/2022 Left Eval 05/09/2022  Hip flexion      Hip extension A: -25* standing A: -25* standing  Hip abduction      Hip adduction      Hip internal rotation  Hip external rotation      Knee flexion      Knee extension      Ankle dorsiflexion NA P: -17*  Ankle plantarflexion NA    Ankle inversion NA    Ankle eversion NA     (Blank rows = not tested)   LOWER EXTREMITY MMT:   MMT Right Eval 05/09/2022 Left Eval 05/09/2022  Hip flexion      Hip extension Functional testing 3-/5 Functional testing 3-/5  Hip abduction Functional testing 3-/5 Functional testing 3-/5  Hip adduction      Hip internal rotation      Hip external rotation      Knee flexion Functional testing 3-/5 Functional testing 3-/5  Knee extension Functional testing 3/5 Functional testing 3/5  Ankle dorsiflexion      Ankle plantarflexion      Ankle inversion      Ankle eversion      (Blank rows = not tested)   POSTURE:  06/23/2022  seated - head forward, rounded shoulders and posterior pelvic tilt  TRANSFERS: 05/09/2022:  Sit to stand: SBA and requires use of armrests from 20" stable w/c & needs RW for stabilization 05/09/2022:  Stand to sit: SBA and requires use of armrests from 20" stable w/c & needs RW for stabilization   GAIT: 05/09/2022:  Gait pattern: step to pattern, decreased step length- Left, decreased stance time- Right, Right hip hike, antalgic, lateral hip instability, trunk flexed, and wide BOS Distance walked: 25' Assistive device utilized: Environmental consultant - 2 wheeled and TTA prosthesis RLE Level of assistance: Min A Comments: pt reports dizziness with turning in gait   UE FUNCTION: 06/23/2022:  Pt reports that he uses RUE for fine motor like writing but uses  both hands for gross motor or sports.  Seated - pt unable to raise LUE in flexion or abd due to pain.  Supine - pt unable to move LUE in flexion or abd due to pain.  PROM flexion > pain than abd   Pt tolerates elbow ext with palm supinated but with palm neutral causes shoulder pain. Also painful with elbow already extended and pronate palm.   PT simulated pushing on RW thru LUE with manual resistance.  Pt reports slight increase in pain but not as sharp as above function.    FUNCTIONAL TESTs:  Merrilee Jansky Balance Scale: 05/09/2022:  9/56 & tasks of Berg with RW support 27/56    [facility] PT Assessment - 05/09/22 0930                Berg Balance Test    Sit to Stand Needs moderate or maximal assist to stand   task with RW support = 3    Standing Unsupported Unable to stand 30 seconds unassisted   task with RW support = 3    Sitting with Back Unsupported but Feet Supported on Floor or Stool Able to sit safely and securely 2 minutes     Stand to Sit Uses backs of legs against chair to control descent   task with RW support = 3    Transfers Able to transfer safely, definite need of hands     Standing Unsupported with Eyes Closed Needs help to keep from falling   task with RW support = 3    Standing Unsupported with Feet Together Needs help to attain position and unable to hold for 15 seconds   task with RW support = 2    From Standing, Reach Forward with  Outstretched Arm Loses balance while trying/requires external support   task with RW support = 1    From Standing Position, Pick up Object from Floor Unable to try/needs assist to keep balance   task with RW support = 2    From Standing Position, Turn to Look Behind Over each Shoulder Needs assist to keep from losing balance and falling   task with RW support = 1    Turn 360 Degrees Needs assistance while turning   task with RW support = 0    Standing Unsupported, Alternately Place Feet on Step/Stool Needs assistance to keep from falling or unable to try    task with RW support = 0    Standing Unsupported, One Foot in [employer] balance while stepping or standing   task with RW support = 1    Standing on One Leg Unable to try or needs assist to prevent fall   task with RW support = 1    Total Score 9     Berg comment: tasks of Berg with RW support 27/56                     CURRENT PROSTHETIC WEAR ASSESSMENT: 05/09/2022:   Patient is dependent with: skin check, residual limb care, care of non-amputated limb, prosthetic cleaning, ply sock cleaning, and correct ply sock adjustment Donning prosthesis: Modified independence Doffing prosthesis: Modified independence Prosthetic wear tolerance: >90% of awake hours/day, 7 days/week Prosthetic weight bearing tolerance: 5 minutes Edema: LLE pitting edema with >10 sec capillary refill Residual limb condition: Right BKA limb has no openings, normal temperature & moisture.  Redness patella & patella tendon from friction of liner with knee flexed. Cylinderical shape.  LLE TMA pt's wife showed picture of wound on dorsum of foot that is being managed by PCP.   Prosthetic description: silicon liner with pin lock suspension, total contact socket, dynamic response foot.      TODAY'S TREATMENT:  06/30/2022 Resting 72 SpO2 96% All standing activities with 1/2" lift LLE:   Neuromuscular Re-ed Crossways on foam head turns 4 directions 5 reps ea light BUE support, minA to [place] for balance, visual mirror, manual/tactile, & verbal cues for upright posture & balance  HR 117 SpO2 96% Floor eyes closed static 15 sec 3 reps & head turns 4 directions 3 reps ea with cues to reset between ea rep minA to [place] for balance, visual mirror, manual/tactile, & verbal cues for upright posture & balance  HR 107 SpO2 97%  Therapeutic Exercise:  Step up & step down 4" BUE support minA 3 reps ea LE lead with visual mirror, manual/tactile, & verbal cues on technique   HR 143 SpO2 90% seated rest HR 129 and took 90 sec for SpO2 to  reach 95%  pt was aware of need to rest PT reviewed shoulder exercises and recommendation to continue.  Pt verbalized understanding  06/27/2022 TherAct: -Stand pivot transfer w/c to/from tall table height x 6 with RW UE support, verbal cueing and demo for step order with RW. PT instructed in using RW improves his technique & safety.  -Sit to stand transfer x 8, verbal cueing and demo for weight shift and technique with RW -pt demo his technique for sit<>supine which was technique that would cause orthopedic issues like his shoulder.  PT verbal and tactile cueing and demonstration for rolling with residual limb, momentum with leg swing, pushing up with both arms to protect shoulder;  pt able to return  demo Log roll bed mobility sit to/from supine technique x 2 ea,   TherEx: PT demo & verbal cues on pendulum for left shoulder  - pt return demo Standing Lt shoulder pendulums at sink Rt forearm support A-P, M-L, circles ea. x 20s, pt education to add 30-60s ea. 2-3x/daily with HEP. Pt verbalized understanding.   06/23/2022 PT educated pt & wife on upper body posture & relationship to shoulder function / pain. PT demo & verbal cues on positioning LUE seated & supine. Pt & wife verbalized understanding.    06/23/22 1413  PT Education  Education Details Access Code: [email or web address]  Person(s) Educated Patient;Spouse  Methods Explanation;Handout;Demonstration;Tactile cues;Verbal cues  Comprehension Verbalized understanding;Returned demonstration;Verbal cues required;Tactile cues required;Need further instruction    Access Code: [email or web address] URL: [email or web address] Date: 06/23/2022 Prepared by: Jamey Reas  Exercises - Seated Correct Posture  - 1 x daily - 7 x weekly - Seated Scapular Retraction  - 2-5 x daily - 7 x weekly - 1 sets - 10 reps - 5 seconds hold - Supine Chin Tucks on Flat Ball  - 2-5 x daily - 7 x weekly - 1 sets - 10 reps - 5 seconds hold - Seated Isometric Cervical  Retraction with Chin Tucks with Pillow behind head  - 2-5 x daily - 7 x weekly - 1 sets - 10 reps - 5 seconds hold     05/11/22 1636  PT Education  Education Details HEP at sink & sitting on bar stool to develop back strength / endurance  Person(s) Educated Patient;Spouse  Methods Explanation;Demonstration;Tactile cues;Verbal cues;Handout  Comprehension Verbalized understanding;Returned demonstration;Verbal cues required;Tactile cues required;Need further instruction   Neuromuscular Reed-  cues on proprioception RLE using residual limb, balance & standing tolerance.   Do each exercise 1-2  times per day Do each exercise 5-10 repetitions Hold each exercise for 2 seconds to feel your location  AT [place].  Try to find this position when standing still for activities.   USE TAPE ON FLOOR TO MARK THE MIDLINE POSITION which is even with middle of sink.  You also should try to feel with your limb pressure in socket.  You are trying to feel with limb what you used to feel with the bottom of your foot.  Side to Side Shift: Moving your hips only (not shoulders): move weight onto your left leg, HOLD/FEEL pressure in socket.  Move back to equal weight on each leg, HOLD/FEEL pressure in socket. Move weight onto your right leg, HOLD/FEEL pressure in socket. Move back to equal weight on each leg, HOLD/FEEL pressure in socket. Repeat.  Start with both hands on sink, progress to hand on prosthetic side only, then no hands.  Front to Back Shift: Moving your hips only (not shoulders): move your weight forward onto your toes, HOLD/FEEL pressure in socket. Move your weight back to equal Flat Foot on both legs, HOLD/FEEL  pressure in socket. Move your weight back onto your heels, HOLD/FEEL  pressure in socket. Move your weight back to equal on both legs, HOLD/FEEL  pressure in socket. Repeat.  Start with both hands on sink, progress to hand on  prosthetic side only, then no hands.  Moving Cones / Cups: With equal weight on each leg: Hold on with one hand the first time, then progress to no hand supports. Move cups from one side of sink to the other. Place cups ~2" out of your reach, progress to  10" beyond reach.  Place one hand in middle of sink and reach with other hand. Do both arms.  Then hover one hand and move cups with other hand.  Overhead/Upward Reaching: alternated reaching up to top cabinets or ceiling if no cabinets present. Keep equal weight on each leg. Start with one hand support on counter while other hand reaches and progress to no hand support with reaching.  ace one hand in middle of sink and reach with other hand. Do both arms.  Then hover one hand and move cups with other hand.  5.   Looking Over Shoulders: With equal weight on each leg: alternate turning to look over your shoulders with one hand support on counter as needed.  Start with head motions only to look in front of shoulder, then even with shoulder and progress to looking behind you. To look to side, move head /eyes, then shoulder on side looking pulls back, shift more weight to side looking and pull hip back. Place one hand in middle of sink and let go with other hand so your shoulder can pull back. Switch hands to look other way.   Then hover one hand and look over shoulder. If looking right, use left hand at sink. If looking left, use right hand at sink. 6.  Alternate Tapping Toes or Stepping to lower shelf of cabinet  Move items under cabinet out of your way. Shift your hips/pelvis so weight on stance leg & Tighten muscles in hip.  SLOWLY step other leg so front of foot is in cabinet. Then step back to floor. Repeat with other leg.      ASSESSMENT:   CLINICAL IMPRESSION: Pt is improving his balance with PT interventions. His shoulder is not painful with initial PT exercise and can progress for AA with wand next week if pain continues to be improved.  He fatigues  quickly with functional standing strengthening exercises.  His heart rate & SpO2 is not increasing with low intensity activities like balance but increases with step ups / downs.  He continues to benefit from skilled PT.  OBJECTIVE IMPAIRMENTS .Abnormal gait, cardiopulmonary status limiting activity, decreased activity tolerance, decreased balance, decreased endurance, decreased knowledge of use of DME, decreased mobility, decreased ROM, decreased strength, dizziness, increased edema, impaired flexibility, postural dysfunction, prosthetic dependency , obesity, and pain.    ACTIVITY LIMITATIONS carrying, lifting, bending, standing, stairs, transfers, locomotion level, UE use with ADLs, sleep    PARTICIPATION LIMITATIONS: driving, community activity, and yard work, UE use with ADLs &  household activities   [place], Time since onset of injury/illness/exacerbation, and 3+ comorbidities: see PMH  are also affecting patient's functional outcome.    REHAB POTENTIAL: Good   CLINICAL DECISION MAKING: Evolving/moderate complexity   EVALUATION COMPLEXITY: Moderate     GOALS: Goals reviewed with patient? Yes  SHORT TERM GOALS: Target date: 07/14/2022  1. Patient able to stand with no UE support for 1 min with supervision. Baseline: SEE OBJECTIVE DATA Goal status: ongoing 06/20/22  2. Patient independent with donning/doffing new AFO or prosthesis. Baseline: SEE OBJECTIVE DATA Goal status: ongoing 06/20/22  3. Patient demo proper sitting posture for left shoulder pain & function.  Goal status: new 06/23/2022  4. Left shoulder pain reported </= 7/10 Goal status: new 06/23/2022   LONG TERM GOALS: Target date: 08/04/2022   Patient demonstrates & verbalized understanding of prosthetic care to enable safe utilization of prosthesis. Baseline: SEE OBJECTIVE DATA Goal status: INITIAL   Patient tolerates  right prosthesis & left AFO / partial foot prosthesis wear >90% of awake hours  without skin or limb pain issues. Baseline: SEE OBJECTIVE DATA Goal status: INITIAL   Berg Balance >/= 20/56 and tasks of Berg with RW support >/= 45/56 to indicate lower fall risk with standing ADLs Baseline: SEE OBJECTIVE DATA Goal status: INITIAL   Patient ambulates >300' with prosthesis & LRAD modified independent Baseline: SEE OBJECTIVE DATA Goal status: INITIAL   Patient negotiates ramps, curbs & stairs with single rail with prosthesis & LRAD modified independent. Baseline: SEE OBJECTIVE DATA Goal status: INITIAL  6. Left shoulder pain </= 3/10 with functional activities.  Goal status: new 06/23/2022  7. Left shoulder AROM within 75% of Right shoulder  Goal status: new 06/23/2022  8.patient able to use LUE for ADLs, gait, transfers, etc. Safely  Goal status: new 06/23/2022     PLAN: PT FREQUENCY: 2x/week   PT DURATION: 13 weeks (90 days);   shoulder included on 06/23/2022 for 6 weeks.    PLANNED INTERVENTIONS: Therapeutic exercises, Therapeutic activity, Neuromuscular re-education, Balance training, Gait training, Patient/Family education, Self Care, Joint mobilization, Stair training, Vestibular training, Canalith repositioning, Prosthetic training, DME instructions, Dry Needling, Electrical stimulation, Cryotherapy, Moist heat, Taping, Vasopneumatic device, Ultrasound, Manual therapy, Re-evaluation, and physical performance testing    PLAN FOR NEXT SESSION:   continue to monitor HR & SpO2 with activities, update HEP for shoulder with wand exercises, standing balance activities, step up & step downs, gait and transfers with shoe / AFO when provided   Jamey Reas, PT, DPT 06/30/2022, 9:37 AM

## 2022-07-01 ENCOUNTER — Ambulatory Visit: Payer: Medicare Other

## 2022-07-05 ENCOUNTER — Encounter: Payer: Self-pay | Admitting: Physical Therapy

## 2022-07-05 ENCOUNTER — Ambulatory Visit (INDEPENDENT_AMBULATORY_CARE_PROVIDER_SITE_OTHER): Payer: Medicare Other | Admitting: Physical Therapy

## 2022-07-05 DIAGNOSIS — M25512 Pain in left shoulder: Secondary | ICD-10-CM

## 2022-07-05 DIAGNOSIS — R600 Localized edema: Secondary | ICD-10-CM

## 2022-07-05 DIAGNOSIS — R293 Abnormal posture: Secondary | ICD-10-CM | POA: Diagnosis not present

## 2022-07-05 DIAGNOSIS — R2681 Unsteadiness on feet: Secondary | ICD-10-CM | POA: Diagnosis not present

## 2022-07-05 DIAGNOSIS — M6281 Muscle weakness (generalized): Secondary | ICD-10-CM

## 2022-07-05 DIAGNOSIS — M25612 Stiffness of left shoulder, not elsewhere classified: Secondary | ICD-10-CM

## 2022-07-05 DIAGNOSIS — R2689 Other abnormalities of gait and mobility: Secondary | ICD-10-CM | POA: Diagnosis not present

## 2022-07-05 NOTE — Progress Notes (Signed)
   07/05/22 0935  PT Education  Education Details Access Code: [email or web address] updated & reviewed  Person(s) Educated Patient;Spouse  Methods Explanation;Demonstration;Tactile cues;Verbal cues;Handout  Comprehension Verbalized understanding;Returned demonstration;Need further instruction

## 2022-07-05 NOTE — Therapy (Signed)
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 onset of injury/illness/exacerbation, and 3+ comorbidities: see PMH  are also affecting patient's functional outcome.    REHAB POTENTIAL: Good   CLINICAL DECISION MAKING: Evolving/moderate complexity   EVALUATION COMPLEXITY: Moderate     GOALS: Goals reviewed with patient? Yes  SHORT TERM GOALS: Target date: 07/14/2022  1. Patient able to stand with no UE support for 1 min with supervision. Baseline: SEE OBJECTIVE DATA Goal status: ongoing 06/20/22  2. Patient independent with donning/doffing new AFO or prosthesis. Baseline: SEE OBJECTIVE DATA Goal status: ongoing 06/20/22  3. Patient demo proper sitting posture for left shoulder pain & function.  Goal status: new 06/23/2022  4. Left shoulder pain reported </= 7/10 Goal status: new 06/23/2022   LONG TERM GOALS: Target date: 08/04/2022   Patient demonstrates & verbalized understanding of  prosthetic care to enable safe utilization of prosthesis. Baseline: SEE OBJECTIVE DATA Goal status: INITIAL   Patient tolerates right prosthesis & left AFO / partial foot prosthesis wear >90% of awake hours without skin or limb pain issues. Baseline: SEE OBJECTIVE DATA Goal status: INITIAL   Berg Balance >/= 20/56 and tasks of Berg with RW support >/= 45/56 to indicate lower fall risk with standing ADLs Baseline: SEE OBJECTIVE DATA Goal status: INITIAL   Patient ambulates >300' with prosthesis & LRAD modified independent Baseline: SEE OBJECTIVE DATA Goal status: INITIAL   Patient negotiates ramps, curbs & stairs with single rail with prosthesis & LRAD modified independent. Baseline: SEE OBJECTIVE DATA Goal status: INITIAL  6. Left shoulder pain </= 3/10 with functional activities.  Goal status: new 06/23/2022  7. Left shoulder AROM within 75% of Right shoulder  Goal status: new 06/23/2022  8.patient able to use LUE for ADLs, gait, transfers, etc. Safely  Goal status: new 06/23/2022     PLAN: PT FREQUENCY: 2x/week   PT DURATION: 13 weeks (90 days);   shoulder included on 06/23/2022 for 6 weeks.    PLANNED INTERVENTIONS: Therapeutic exercises, Therapeutic activity, Neuromuscular re-education, Balance training, Gait training, Patient/Family education, Self Care, Joint mobilization, Stair training, Vestibular training,  Canalith repositioning, Prosthetic training, DME instructions, Dry Needling, Electrical stimulation, Cryotherapy, Moist heat, Taping, Vasopneumatic device, Ultrasound, Manual therapy, Re-evaluation, and physical performance testing    PLAN FOR NEXT SESSION:   continue to monitor HR & SpO2 with activities, verbally check updated HEP for shoulder with wand exercises, check how shoes & sock are doing on foot, gait with RW progressing distance   Jamey Reas, PT, DPT 07/05/2022, 9:48 AM

## 2022-07-07 ENCOUNTER — Encounter: Payer: Self-pay | Admitting: Physical Therapy

## 2022-07-07 ENCOUNTER — Ambulatory Visit (INDEPENDENT_AMBULATORY_CARE_PROVIDER_SITE_OTHER): Payer: Medicare Other | Admitting: Physical Therapy

## 2022-07-07 DIAGNOSIS — R293 Abnormal posture: Secondary | ICD-10-CM

## 2022-07-07 DIAGNOSIS — R2689 Other abnormalities of gait and mobility: Secondary | ICD-10-CM | POA: Diagnosis not present

## 2022-07-07 DIAGNOSIS — M6281 Muscle weakness (generalized): Secondary | ICD-10-CM

## 2022-07-07 DIAGNOSIS — M25512 Pain in left shoulder: Secondary | ICD-10-CM

## 2022-07-07 DIAGNOSIS — M25612 Stiffness of left shoulder, not elsewhere classified: Secondary | ICD-10-CM

## 2022-07-07 DIAGNOSIS — R2681 Unsteadiness on feet: Secondary | ICD-10-CM | POA: Diagnosis not present

## 2022-07-07 DIAGNOSIS — R600 Localized edema: Secondary | ICD-10-CM

## 2022-07-07 NOTE — Therapy (Signed)
OUTPATIENT PHYSICAL THERAPY TREATMENT NOTE   Patient Name: Shane [name] MRN: [id number] DOB:1941/06/09, 81 y.o., male Today's Date: 07/07/2022  PCP: Burnard Bunting, MD REFERRING PROVIDER: [name], MD for TTA & [name], NP for left shoulder   END OF SESSION:   PT End of Session - 07/07/22 0844     Visit Number 16    Number of Visits 26    Date for PT Re-Evaluation 08/04/22    Authorization Type UHC Medicare    Authorization Time Period [facility] MEDICARE  $20 COPAY    Progress Note Due on Visit 20    PT Start Time 0845    PT Stop Time 0926    PT Time Calculation (min) 41 min    Equipment Utilized During Treatment Gait belt    Activity Tolerance Patient tolerated treatment well;Patient limited by fatigue    Behavior During Therapy WFL for tasks assessed/performed             Past Medical History:  Diagnosis Date   Anxiety    AV block, Mobitz 1    Cataract    Chronic kidney disease    d/t DM   CKD (chronic kidney disease), stage III ([place])    Depression    Diabetes mellitus    Vgo disposal insulin bolus  simular to insulin pump   Dyspnea    GERD (gastroesophageal reflux disease)    History of kidney stones    passed   Hyperlipidemia    Hypertension    Idiopathic pulmonary fibrosis ([place]) 11/2016   ILD (interstitial lung disease) ([place])    Moderate aortic stenosis    a. 10/2019 Echo: EF 55-60%, Gr2 DD. Nl RV.    Neuromuscular disorder ([place])    Neuropathy associated with endocrine disorder ([place])    Nonobstructive CAD (coronary artery disease)    a. 2012 Cath: mod, nonobs dzs; b. 10/2016 MV: EF 60%, no ischemia.   OSA on CPAP 05/05/2017   Unattended Home Sleep Test 7/2/813-AHI 38.6/hour, desaturation to 64%, body weight 261 pounds   PONV (postoperative nausea and vomiting)    Sleep apnea     uses cpap asked to bring mask and tubing   Past Surgical History:  Procedure Laterality Date   ABDOMINAL AORTOGRAM W/LOWER EXTREMITY N/A 12/10/2020    Procedure: ABDOMINAL AORTOGRAM W/LOWER EXTREMITY;  Surgeon: Cherre Robins, MD;  Location: [place] [facility];  Service: Cardiovascular;  Laterality: N/A;   AMPUTATION Right 01/22/2021   Procedure: 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;  Surgeon: [name], MD;  Location: [place];  Service: Orthopedics;  Laterality: Right;   AMPUTATION Right 03/17/2021   Procedure: RIGHT BELOW KNEE AMPUTATION;  Surgeon: [name], MD;  Location: [place];  Service: Orthopedics;  Laterality: Right;   ANKLE FUSION Right 01/22/2021   Procedure: RIGHT FOOT TIBIOCALCANEAL FUSION;  Surgeon: [name], MD;  Location: [place];  Service: Orthopedics;  Laterality: Right;   ANTERIOR FUSION CERVICAL SPINE  2012   CARDIAC CATHETERIZATION  2011   CARDIAC CATHETERIZATION N/A 11/09/2016   Procedure: Right Heart Cath;  Surgeon: Belva Crome, MD;  Location: [place] [facility];  Service: Cardiovascular;  Laterality: N/A;   carpel tunnel     left wrist   CATARACT EXTRACTION     CATARACT EXTRACTION W/ INTRAOCULAR LENS  IMPLANT, BILATERAL  2013   CERVICAL LAMINECTOMY  2012   COLONOSCOPY N/A 01/14/2013   Procedure: COLONOSCOPY;  Surgeon: Irene Shipper, MD;  Location: [facility];  Service: Endoscopy;  Laterality: N/A;   CORONARY ARTERY BYPASS GRAFT  11/04/2020   LIMA-LAD, SVG-OM1, SVG-PDA (Dr [facility]) dc 11/18/2020   EYE SURGERY     I & D EXTREMITY Right 02/19/2021   Procedure: RIGHT ANKLE DEBRIDEMENT AND PLACEMENT ANTIBIOTIC BEADS;  Surgeon: [name], MD;  Location: [place];  Service: Orthopedics;  Laterality: Right;   KNEE SURGERY  1998   left   LEFT HEART CATH AND CORONARY ANGIOGRAPHY N/A 07/10/2020   Procedure: LEFT HEART CATH AND CORONARY ANGIOGRAPHY;  Surgeon: Sherren Mocha, MD;  Location: [place] [facility];  Service: Cardiovascular;  Laterality: N/A;   LUMBAR LAMINECTOMY  2003   LUNG BIOPSY Left 12/26/2016   Procedure: LUNG BIOPSY;  Surgeon: [name], MD;  Location: [place];  Service: Thoracic;   Laterality: Left;   PACEMAKER IMPLANT N/A 03/30/2020   Procedure: PACEMAKER IMPLANT;  Surgeon: Evans Lance, MD;  Location: [place] [facility];  Service: Cardiovascular;  Laterality: N/A;   PERIPHERAL VASCULAR INTERVENTION Right 12/10/2020   Procedure: PERIPHERAL VASCULAR INTERVENTION;  Surgeon: Cherre Robins, MD;  Location: [place] [facility];  Service: Cardiovascular;  Laterality: Right;  SFA   POSTERIOR FUSION CERVICAL SPINE  2012   PPM GENERATOR REMOVAL N/A 12/14/2020   Procedure: PPM GENERATOR REMOVAL;  Surgeon: Evans Lance, MD;  Location: [place] [facility];  Service: Cardiovascular;  Laterality: N/A;   TEE WITHOUT CARDIOVERSION N/A 12/11/2020   Procedure: TRANSESOPHAGEAL ECHOCARDIOGRAM (TEE);  Surgeon: Geralynn Rile, MD;  Location: [place];  Service: Cardiovascular;  Laterality: N/A;   TRIGGER FINGER RELEASE  2011   4th finger left hand   VIDEO ASSISTED THORACOSCOPY Left 12/26/2016   Procedure: VIDEO ASSISTED THORACOSCOPY;  Surgeon: [name], MD;  Location: [place];  Service: Thoracic;  Laterality: Left;   VIDEO BRONCHOSCOPY N/A 12/26/2016   Procedure: VIDEO BRONCHOSCOPY;  Surgeon: [name], MD;  Location: [place];  Service: Thoracic;  Laterality: N/A;   Patient Active Problem List   Diagnosis Date Noted   Intermediate stage nonexudative age-related macular degeneration of both eyes 04/25/2022   Research study patient 12/24/2021   Post-operative pain    CKD (chronic kidney disease), stage II    Labile blood glucose    Pulmonary fibrosis ([place])    Hyponatremia    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[place]) 03/26/2021   S/P BKA (below knee amputation) unilateral, right ([place])    Shortness of breath    Acute blood loss anemia    Stage 3 chronic kidney disease ([place])    Atrial fibrillation ([place])    Controlled type 2 diabetes mellitus with hyperglycemia, with long-term current use of insulin ([facility])    Postoperative pain    Postoperative hemorrhagic shock  03/20/2021   Atherosclerosis of native arteries of extremities with gangrene, right leg ([place])    Hardware complicating wound infection ([place])    Abscess of bursa of right ankle 02/15/2021   Endocarditis 01/19/2021   Peripheral vascular disease ([place]) 12/29/2020   CHF (congestive heart failure), NYHA class II, chronic, diastolic ([place]) 70/35/0093   History of COVID-19 12/22/2020   SSS (sick sinus syndrome) ([place]) 12/22/2020   Acute bacterial endocarditis    MSSA bacteremia 12/09/2020   Diabetic foot ulcer ([place]) 12/09/2020   Foot drop, right foot    Generalized weakness 12/08/2020   Dehydration with hyponatremia 12/08/2020   Atrial fibrillation, chronic ([place]) 12/08/2020   Elevated troponin level not due myocardial infarction 12/08/2020  Adrenal insufficiency ([place]) 11/14/2020   Orthostatic hypotension 11/14/2020   Physical deconditioning 11/14/2020   Difficulty sleeping 11/07/2020   Postoperative anemia due to acute blood loss 11/07/2020   Right ventricular dysfunction 11/05/2020   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/04/2020   Hearing loss 11/03/2020   Cardiac device in situ 09/09/2020   Coronary atherosclerosis due to lipid rich plaque 09/02/2020   COVID-19 virus infection 07/18/2020   Coronary artery disease involving native heart without angina pectoris 07/10/2020   Chronic kidney disease, stage 3a ([place]) 03/29/2020   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/28/2020   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 ([place]) 03/16/2020   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 ([place]) 03/16/2020   Right epiretinal membrane 03/16/2020   Posterior vitreous detachment of right eye 03/16/2020   Aortic stenosis, moderate 03/12/2020   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   Near syncope 04/27/2018   Hypoglycemia due to insulin 01/06/2018   Essential hypertension 01/06/2018   Diabetes mellitus type 2, with  complication, on long term insulin pump ([place]) 01/06/2018   Syncope and collapse 01/05/2018   Encounter for therapeutic drug monitoring 06/29/2017   OSA on CPAP 05/05/2017   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[place]) 01/05/2017   Abnormal chest x-ray 10/12/2016   Benign neoplasm of colon 01/14/2013    REFERRING DIAG: Z89.511 (ICD-10-CM) - Hx of right BKA  ONSET DATE: 05/04/2022 MD visit with release to WB on LLE  THERAPY DIAG:  Unsteadiness on feet  Other abnormalities of gait and mobility  Muscle weakness (generalized)  Abnormal posture  Acute pain of left shoulder  Stiffness of left shoulder, not elsewhere classified  Localized edema  Rationale for Evaluation and Treatment Rehabilitation  PERTINENT HISTORY: CKD st III, AV block Mobitz, pacemaker, pulmonary fibrosis, CAD, CABG 21, HTN, A-Fib, DM2, PAD, right BKA, Left TMA  PRECAUTIONS: ICD/Pacemaker - continue monitoring vitals with activity and calculated HRmax is 151 bpm  SUBJECTIVE: He has dizziness sometimes with sudden movements but it isn't too bad which is an improvement. Shoulder HEP is going well with some stiffness. He wears the shoe all day with no concerns for skin integrity or wrinkles from socks. They are wearing a new compression sock without the wound properties, but it fits shorter inside the foot segment. He has no pain and isn't taking tylenol.   PAIN:    Are you having pain? Yes: NPRS scale: today 0/10, over last week lowest 0/10  Pain location: left shoulder lateral just distal to joint Pain description: tingling when shoulder in improper posture. Aggravating factors: improper posture. Relieving factors: correcting posture   OBJECTIVE: (objective measures completed at initial evaluation unless otherwise dated)  COGNITION: Overall cognitive status: Within functional limits for tasks assessed   POSTURE: 05/09/2022:  rounded shoulders, forward head, and flexed trunk    LOWER EXTREMITY ROM:    UPPER  EXTREMITY ROM:  ROM P:passive  A:active Left 06/23/22 Supine all limited by pain  Shoulder flexion 30*  Shoulder extension 0* neutral  Shoulder abduction 45*  Shoulder adduction   Shoulder internal rotation 20*  Shoulder external rotation 10*  Elbow flexion   Elbow extension -15*  Wrist flexion   Wrist extension   Wrist ulnar deviation   Wrist radial deviation   Wrist pronation   Wrist supination    (Blank rows = not tested)    ROM P:passive  A:active Right Eval 05/09/2022 Left Eval 05/09/2022  Hip flexion      Hip extension A: -25* standing A: -25* standing  Hip abduction      Hip adduction      Hip internal rotation      Hip external rotation      Knee flexion      Knee extension      Ankle dorsiflexion NA P: -17*  Ankle plantarflexion NA    Ankle inversion NA    Ankle eversion NA     (Blank rows = not tested)   LOWER EXTREMITY MMT:   MMT Right Eval 05/09/2022 Left Eval 05/09/2022  Hip flexion      Hip extension Functional testing 3-/5 Functional testing 3-/5  Hip abduction Functional testing 3-/5 Functional testing 3-/5  Hip adduction      Hip internal rotation      Hip external rotation      Knee flexion Functional testing 3-/5 Functional testing 3-/5  Knee extension Functional testing 3/5 Functional testing 3/5  Ankle dorsiflexion      Ankle plantarflexion      Ankle inversion      Ankle eversion      (Blank rows = not tested)   POSTURE:  06/23/2022  seated - head forward, rounded shoulders and posterior pelvic tilt  TRANSFERS: 05/09/2022:  Sit to stand: SBA and requires use of armrests from 20" stable w/c & needs RW for stabilization 05/09/2022:  Stand to sit: SBA and requires use of armrests from 20" stable w/c & needs RW for stabilization   GAIT: 05/09/2022:  Gait pattern: step to pattern, decreased step length- Left, decreased stance time- Right, Right hip hike, antalgic, lateral hip instability, trunk flexed, and wide BOS Distance walked:  25' Assistive device utilized: Environmental consultant - 2 wheeled and TTA prosthesis RLE Level of assistance: Min A Comments: pt reports dizziness with turning in gait   UE FUNCTION: 06/23/2022:  Pt reports that he uses RUE for fine motor like writing but uses both hands for gross motor or sports.  Seated - pt unable to raise LUE in flexion or abd due to pain.  Supine - pt unable to move LUE in flexion or abd due to pain.  PROM flexion > pain than abd   Pt tolerates elbow ext with palm supinated but with palm neutral causes shoulder pain. Also painful with elbow already extended and pronate palm.   PT simulated pushing on RW thru LUE with manual resistance.  Pt reports slight increase in pain but not as sharp as above function.    FUNCTIONAL TESTs:  Merrilee Jansky Balance Scale: 05/09/2022:  9/56 & tasks of Berg with RW support 27/56    [facility] PT Assessment - 05/09/22 0930                Berg Balance Test    Sit to Stand Needs moderate or maximal assist to stand   task with RW support = 3    Standing Unsupported Unable to stand 30 seconds unassisted   task with RW support = 3    Sitting with Back Unsupported but Feet Supported on Floor or Stool Able to sit safely and securely 2 minutes     Stand to Sit Uses backs of legs against chair to control descent   task with RW support = 3    Transfers Able to transfer safely, definite need of hands     Standing Unsupported with Eyes Closed Needs help to keep from falling   task with RW support =  3    Standing Unsupported with Feet Together Needs help to attain position and unable to hold for 15 seconds   task with RW support = 2    From Standing, Reach Forward with Outstretched Arm Loses balance while trying/requires external support   task with RW support = 1    From Standing Position, Pick up Object from Floor Unable to try/needs assist to keep balance   task with RW support = 2    From Standing Position, Turn to Look Behind Over each Shoulder Needs assist to keep from  losing balance and falling   task with RW support = 1    Turn 360 Degrees Needs assistance while turning   task with RW support = 0    Standing Unsupported, Alternately Place Feet on Step/Stool Needs assistance to keep from falling or unable to try   task with RW support = 0    Standing Unsupported, One Foot in [employer] balance while stepping or standing   task with RW support = 1    Standing on One Leg Unable to try or needs assist to prevent fall   task with RW support = 1    Total Score 9     Berg comment: tasks of Berg with RW support 27/56                     CURRENT PROSTHETIC WEAR ASSESSMENT: 05/09/2022:   Patient is dependent with: skin check, residual limb care, care of non-amputated limb, prosthetic cleaning, ply sock cleaning, and correct ply sock adjustment Donning prosthesis: Modified independence Doffing prosthesis: Modified independence Prosthetic wear tolerance: >90% of awake hours/day, 7 days/week Prosthetic weight bearing tolerance: 5 minutes Edema: LLE pitting edema with >10 sec capillary refill Residual limb condition: Right BKA limb has no openings, normal temperature & moisture.  Redness patella & patella tendon from friction of liner with knee flexed. Cylinderical shape.  LLE TMA pt's wife showed picture of wound on dorsum of foot that is being managed by PCP.   Prosthetic description: silicon liner with pin lock suspension, total contact socket, dynamic response foot.      TODAY'S TREATMENT:  07/07/2022 Neuromuscular re-ed: working on upright balance with shoes //bars UE support A-P weight shift x 1 min and lateral weight shift x 1 //bars full step forward stance 2 x 30s bil., frequent UE touch support -following all standing balance activity, HR 119 SpO2 97%  Therapeutic Exercise: Leg Press back 45* BLEs 125# 25 reps. HR 96 SpO2 97%  Prosthetic Training with right TTA prosthesis & left shoe with toe filler & carbon plate Pt ambulated 30' with 180* turn &  57' with RW with supervision, with cueing for upright posture and short steps to prevent shoe sliding. No dizziness or SOB noted.  Following 55' - HR 108 SpO2 99% PT educated for compression sock fit over foot and wearing Vivewear sock with wound care properties compared to std compression sock. pt and wife verbalized understanding  07/05/2022 Resting 72 SpO2 96% Therapeutic Exercise: Pt performed 1 set of 10 of supine wand exercises for comprehension with minimal discomfort.    07/05/22 0935  PT Education  Education Details Access Code: [email or web address] updated & reviewed  Person(s) Educated Patient;Spouse  Methods Explanation;Demonstration;Tactile cues;Verbal cues;Handout  Comprehension Verbalized understanding;Returned demonstration;Need further instruction   Access Code: [email or web address] URL: [email or web address] Date: 07/05/2022 Prepared by: Jamey Reas  Exercises - Seated Correct Posture  - 1 x daily - 7 x weekly -  Seated Scapular Retraction  - 2-5 x daily - 7 x weekly - 1 sets - 10 reps - 5 seconds hold - Supine Chin Tucks on Flat Ball  - 2-5 x daily - 7 x weekly - 1 sets - 10 reps - 5 seconds hold - Seated Isometric Cervical Retraction with Chin Tucks with Pillow behind head  - 2-5 x daily - 7 x weekly - 1 sets - 10 reps - 5 seconds hold - Circular Shoulder Pendulum with Table Support  - 1-2 x daily - 7 x weekly - 1-2 sets - 10 reps - 5 seconds hold - Flexion-Extension Shoulder Pendulum with Table Support  - 1-2 x daily - 7 x weekly - 1-2 sets - 10 reps - Horizontal Shoulder Pendulum with Table Support  - 1-2 x daily - 7 x weekly - 1-2 sets - 10 reps - Supine Shoulder Flexion Extension AAROM with Dowel  - 1 x daily - 7 x weekly - 1-2 sets - 10 reps - 5 seconds hold - Supine Shoulder Abduction AAROM with Dowel  - 1 x daily - 7 x weekly - 1-2 sets - 10 reps - 5 seconds hold - Supine Shoulder External Rotation with Dowel  - 1 x daily - 7 x weekly - 1-2 sets - 10 reps - 5 seconds  hold  Prosthetic Training with right TTA prosthesis & left shoe with toe filler & carbon plate PT discussed with pt & wife having one compression sock altered to have no extra material at toe.  If it works then can alter the others.  The extra material can be irritant to remaining foot.  PT recommended checking foot after wearing shoe for 2 hours. Pt & wife verbalized understanding.  Pt ambulated 10' & 40' including 90* turn to position to sit with RW with minA.  Shoe was sliding put stayed on foot with cues to take short controlled steps.  PT instructed how to adjust RW.  No dizziness or SOB noted.  HR82 SpO2 97% Pt & wife verbalized understanding for only assisted short distance gait.    06/30/2022 Resting 72 SpO2 96% All standing activities with 1/2" lift LLE:   Neuromuscular Re-ed Crossways on foam head turns 4 directions 5 reps ea light BUE support, minA to [place] for balance, visual mirror, manual/tactile, & verbal cues for upright posture & balance  HR 117 SpO2 96% Floor eyes closed static 15 sec 3 reps & head turns 4 directions 3 reps ea with cues to reset between ea rep minA to [place] for balance, visual mirror, manual/tactile, & verbal cues for upright posture & balance  HR 107 SpO2 97%  Therapeutic Exercise:  Step up & step down 4" BUE support minA 3 reps ea LE lead with visual mirror, manual/tactile, & verbal cues on technique   HR 143 SpO2 90% seated rest HR 129 and took 90 sec for SpO2 to reach 95%  pt was aware of need to rest PT reviewed shoulder exercises and recommendation to continue.  Pt verbalized understanding     06/23/22 1413  PT Education  Education Details Access Code: [email or web address]  Person(s) Educated Patient;Spouse  Methods Explanation;Handout;Demonstration;Tactile cues;Verbal cues  Comprehension Verbalized understanding;Returned demonstration;Verbal cues required;Tactile cues required;Need further instruction   Access Code: [email or web address] URL:  [email or web address] Date: 06/23/2022 Prepared by: Jamey Reas  Exercises - Seated Correct Posture  - 1 x daily - 7 x weekly - Seated Scapular Retraction  - 2-5 x daily - 7 x weekly - 1  sets - 10 reps - 5 seconds hold - Supine Chin Tucks on Flat Ball  - 2-5 x daily - 7 x weekly - 1 sets - 10 reps - 5 seconds hold - Seated Isometric Cervical Retraction with Chin Tucks with Pillow behind head  - 2-5 x daily - 7 x weekly - 1 sets - 10 reps - 5 seconds hold   05/11/22 1636  PT Education  Education Details HEP at sink & sitting on bar stool to develop back strength / endurance  Person(s) Educated Patient;Spouse  Methods Explanation;Demonstration;Tactile cues;Verbal cues;Handout  Comprehension Verbalized understanding;Returned demonstration;Verbal cues required;Tactile cues required;Need further instruction   Neuromuscular Reed-  cues on proprioception RLE using residual limb, balance & standing tolerance.   Do each exercise 1-2  times per day Do each exercise 5-10 repetitions Hold each exercise for 2 seconds to feel your location  AT [place].  Try to find this position when standing still for activities.   USE TAPE ON FLOOR TO MARK THE MIDLINE POSITION which is even with middle of sink.  You also should try to feel with your limb pressure in socket.  You are trying to feel with limb what you used to feel with the bottom of your foot.  Side to Side Shift: Moving your hips only (not shoulders): move weight onto your left leg, HOLD/FEEL pressure in socket.  Move back to equal weight on each leg, HOLD/FEEL pressure in socket. Move weight onto your right leg, HOLD/FEEL pressure in socket. Move back to equal weight on each leg, HOLD/FEEL pressure in socket. Repeat.  Start with both hands on sink, progress to hand on prosthetic side only, then no hands.  Front to Back Shift: Moving your hips only (not shoulders):  move your weight forward onto your toes, HOLD/FEEL pressure in socket. Move your weight back to equal Flat Foot on both legs, HOLD/FEEL  pressure in socket. Move your weight back onto your heels, HOLD/FEEL  pressure in socket. Move your weight back to equal on both legs, HOLD/FEEL  pressure in socket. Repeat.  Start with both hands on sink, progress to hand on prosthetic side only, then no hands.  Moving Cones / Cups: With equal weight on each leg: Hold on with one hand the first time, then progress to no hand supports. Move cups from one side of sink to the other. Place cups ~2" out of your reach, progress to 10" beyond reach.  Place one hand in middle of sink and reach with other hand. Do both arms.  Then hover one hand and move cups with other hand.  Overhead/Upward Reaching: alternated reaching up to top cabinets or ceiling if no cabinets present. Keep equal weight on each leg. Start with one hand support on counter while other hand reaches and progress to no hand support with reaching.  ace one hand in middle of sink and reach with other hand. Do both arms.  Then hover one hand and move cups with other hand.  5.   Looking Over Shoulders: With equal weight on each leg: alternate turning to look over your shoulders with one hand support on counter as needed.  Start with head motions only to look in front of shoulder, then even with shoulder and progress to looking behind you. To look to side, move head /eyes, then shoulder on side looking pulls back, shift more weight to side looking and pull hip back. Place one  hand in middle of sink and let go with other hand so your shoulder can pull back. Switch hands to look other way.   Then hover one hand and look over shoulder. If looking right, use left hand at sink. If looking left, use right hand at sink. 6.  Alternate Tapping Toes or Stepping to lower shelf of cabinet  Move items under cabinet out of your way. Shift your hips/pelvis so weight on stance leg &  Tighten muscles in hip.  SLOWLY step other leg so front of foot is in cabinet. Then step back to floor. Repeat with other leg.      ASSESSMENT:   CLINICAL IMPRESSION: He is wearing the shoe during all waking hours with the toe filler with no concerns for skin integrity or blistering. He reports feeling unsteady in the new shoe and demonstrated need for improved static balance with show and prosthesis. He tolerated gait with RW well with stable vitals and no dizziness, and was limited by fatigue. He continues to benefit from skilled PT.  OBJECTIVE IMPAIRMENTS .Abnormal gait, cardiopulmonary status limiting activity, decreased activity tolerance, decreased balance, decreased endurance, decreased knowledge of use of DME, decreased mobility, decreased ROM, decreased strength, dizziness, increased edema, impaired flexibility, postural dysfunction, prosthetic dependency , obesity, and pain.    ACTIVITY LIMITATIONS carrying, lifting, bending, standing, stairs, transfers, locomotion level, UE use with ADLs, sleep    PARTICIPATION LIMITATIONS: driving, community activity, and yard work, UE use with ADLs &  household activities   [place], Time since onset of injury/illness/exacerbation, and 3+ comorbidities: see PMH  are also affecting patient's functional outcome.    REHAB POTENTIAL: Good   CLINICAL DECISION MAKING: Evolving/moderate complexity   EVALUATION COMPLEXITY: Moderate     GOALS: Goals reviewed with patient? Yes  SHORT TERM GOALS: Target date: 07/14/2022  1. Patient able to stand with no UE support for 1 min with supervision. Baseline: SEE OBJECTIVE DATA Goal status: ongoing 06/20/22  2. Patient independent with donning/doffing new AFO or prosthesis. Baseline: SEE OBJECTIVE DATA Goal status: ongoing 06/20/22  3. Patient demo proper sitting posture for left shoulder pain & function.  Goal status: new 06/23/2022  4. Left shoulder pain reported </= 7/10 Goal status:  new 06/23/2022   LONG TERM GOALS: Target date: 08/04/2022   Patient demonstrates & verbalized understanding of prosthetic care to enable safe utilization of prosthesis. Baseline: SEE OBJECTIVE DATA Goal status: INITIAL   Patient tolerates right prosthesis & left AFO / partial foot prosthesis wear >90% of awake hours without skin or limb pain issues. Baseline: SEE OBJECTIVE DATA Goal status: INITIAL   Berg Balance >/= 20/56 and tasks of Berg with RW support >/= 45/56 to indicate lower fall risk with standing ADLs Baseline: SEE OBJECTIVE DATA Goal status: INITIAL   Patient ambulates >300' with prosthesis & LRAD modified independent Baseline: SEE OBJECTIVE DATA Goal status: INITIAL   Patient negotiates ramps, curbs & stairs with single rail with prosthesis & LRAD modified independent. Baseline: SEE OBJECTIVE DATA Goal status: INITIAL  6. Left shoulder pain </= 3/10 with functional activities.  Goal status: new 06/23/2022  7. Left shoulder AROM within 75% of Right shoulder  Goal status: new 06/23/2022  8.patient able to use LUE for ADLs, gait, transfers, etc. Safely  Goal status: new 06/23/2022     PLAN: PT FREQUENCY: 2x/week   PT DURATION: 13 weeks (90 days);   shoulder included on 06/23/2022 for 6 weeks.    PLANNED INTERVENTIONS: Therapeutic exercises, Therapeutic  activity, Neuromuscular re-education, Balance training, Gait training, Patient/Family education, Self Care, Joint mobilization, Stair training, Vestibular training, Canalith repositioning, Prosthetic training, DME instructions, Dry Needling, Electrical stimulation, Cryotherapy, Moist heat, Taping, Vasopneumatic device, Ultrasound, Manual therapy, Re-evaluation, and physical performance testing    PLAN FOR NEXT SESSION:  continue to monitor HR & SpO2 with activities, check STGs over next 2 visits, gait with RW progressing distance and instruct pt and wife in supervision for walking at home, standing balance tasks with shoe  on, LE stretches for upright posture  Suzan Manon, Student-PT 07/07/2022, 11:42 AM  This entire session of physical therapy was performed under the direct supervision of PT signing evaluation /treatment. PT reviewed note and agrees.  Jamey Reas, PT, DPT 07/07/2022 11:43 AM

## 2022-07-11 ENCOUNTER — Ambulatory Visit (INDEPENDENT_AMBULATORY_CARE_PROVIDER_SITE_OTHER): Payer: Medicare Other | Admitting: Physical Therapy

## 2022-07-11 ENCOUNTER — Encounter: Payer: Self-pay | Admitting: Physical Therapy

## 2022-07-11 DIAGNOSIS — M6281 Muscle weakness (generalized): Secondary | ICD-10-CM

## 2022-07-11 DIAGNOSIS — R2681 Unsteadiness on feet: Secondary | ICD-10-CM

## 2022-07-11 DIAGNOSIS — R293 Abnormal posture: Secondary | ICD-10-CM

## 2022-07-11 DIAGNOSIS — R600 Localized edema: Secondary | ICD-10-CM

## 2022-07-11 DIAGNOSIS — R2689 Other abnormalities of gait and mobility: Secondary | ICD-10-CM | POA: Diagnosis not present

## 2022-07-11 DIAGNOSIS — M25512 Pain in left shoulder: Secondary | ICD-10-CM

## 2022-07-11 DIAGNOSIS — M25612 Stiffness of left shoulder, not elsewhere classified: Secondary | ICD-10-CM

## 2022-07-11 NOTE — Therapy (Signed)
OUTPATIENT PHYSICAL THERAPY TREATMENT NOTE   Patient Name: Shane [name] MRN: [id number] DOB:08-May-1941, 81 y.o., male Today's Date: 07/11/2022  PCP: Burnard Bunting, MD REFERRING PROVIDER: [name], MD for TTA & [name], NP for left shoulder   END OF SESSION:   PT End of Session - 07/11/22 0759     Visit Number 17    Number of Visits 26    Date for PT Re-Evaluation 08/04/22    Authorization Type UHC Medicare    Authorization Time Period [facility] MEDICARE  $20 COPAY    Progress Note Due on Visit 20    PT Start Time 0800    PT Stop Time 0841    PT Time Calculation (min) 41 min    Equipment Utilized During Treatment Gait belt    Activity Tolerance Patient tolerated treatment well;Patient limited by fatigue    Behavior During Therapy WFL for tasks assessed/performed              Past Medical History:  Diagnosis Date   Anxiety    AV block, Mobitz 1    Cataract    Chronic kidney disease    d/t DM   CKD (chronic kidney disease), stage III ([place])    Depression    Diabetes mellitus    Vgo disposal insulin bolus  simular to insulin pump   Dyspnea    GERD (gastroesophageal reflux disease)    History of kidney stones    passed   Hyperlipidemia    Hypertension    Idiopathic pulmonary fibrosis ([place]) 11/2016   ILD (interstitial lung disease) ([place])    Moderate aortic stenosis    a. 10/2019 Echo: EF 55-60%, Gr2 DD. Nl RV.    Neuromuscular disorder ([place])    Neuropathy associated with endocrine disorder ([place])    Nonobstructive CAD (coronary artery disease)    a. 2012 Cath: mod, nonobs dzs; b. 10/2016 MV: EF 60%, no ischemia.   OSA on CPAP 05/05/2017   Unattended Home Sleep Test 7/2/813-AHI 38.6/hour, desaturation to 64%, body weight 261 pounds   PONV (postoperative nausea and vomiting)    Sleep apnea     uses cpap asked to bring mask and tubing   Past Surgical History:  Procedure Laterality Date   ABDOMINAL AORTOGRAM W/LOWER EXTREMITY N/A 12/10/2020    Procedure: ABDOMINAL AORTOGRAM W/LOWER EXTREMITY;  Surgeon: Cherre Robins, MD;  Location: [place] [facility];  Service: Cardiovascular;  Laterality: N/A;   AMPUTATION Right 01/22/2021   Procedure: 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;  Surgeon: [name], MD;  Location: [place];  Service: Orthopedics;  Laterality: Right;   AMPUTATION Right 03/17/2021   Procedure: RIGHT BELOW KNEE AMPUTATION;  Surgeon: [name], MD;  Location: [place];  Service: Orthopedics;  Laterality: Right;   ANKLE FUSION Right 01/22/2021   Procedure: RIGHT FOOT TIBIOCALCANEAL FUSION;  Surgeon: [name], MD;  Location: [place];  Service: Orthopedics;  Laterality: Right;   ANTERIOR FUSION CERVICAL SPINE  2012   CARDIAC CATHETERIZATION  2011   CARDIAC CATHETERIZATION N/A 11/09/2016   Procedure: Right Heart Cath;  Surgeon: Belva Crome, MD;  Location: [place] [facility];  Service: Cardiovascular;  Laterality: N/A;   carpel tunnel     left wrist   CATARACT EXTRACTION     CATARACT EXTRACTION W/ INTRAOCULAR LENS  IMPLANT, BILATERAL  2013   CERVICAL LAMINECTOMY  2012   COLONOSCOPY N/A 01/14/2013   Procedure: COLONOSCOPY;  Surgeon: Irene Shipper, MD;  Location:  [facility];  Service: Endoscopy;  Laterality: N/A;   CORONARY ARTERY BYPASS GRAFT  11/04/2020   LIMA-LAD, SVG-OM1, SVG-PDA (Dr [name]) dc 11/18/2020   EYE SURGERY     I & D EXTREMITY Right 02/19/2021   Procedure: RIGHT ANKLE DEBRIDEMENT AND PLACEMENT ANTIBIOTIC BEADS;  Surgeon: [name], MD;  Location: [place];  Service: Orthopedics;  Laterality: Right;   KNEE SURGERY  1998   left   LEFT HEART CATH AND CORONARY ANGIOGRAPHY N/A 07/10/2020   Procedure: LEFT HEART CATH AND CORONARY ANGIOGRAPHY;  Surgeon: Sherren Mocha, MD;  Location: [place] [facility];  Service: Cardiovascular;  Laterality: N/A;   LUMBAR LAMINECTOMY  2003   LUNG BIOPSY Left 12/26/2016   Procedure: LUNG BIOPSY;  Surgeon: [name], MD;  Location: [place];  Service: Thoracic;   Laterality: Left;   PACEMAKER IMPLANT N/A 03/30/2020   Procedure: PACEMAKER IMPLANT;  Surgeon: Evans Lance, MD;  Location: [place] [facility];  Service: Cardiovascular;  Laterality: N/A;   PERIPHERAL VASCULAR INTERVENTION Right 12/10/2020   Procedure: PERIPHERAL VASCULAR INTERVENTION;  Surgeon: Cherre Robins, MD;  Location: [place] [facility];  Service: Cardiovascular;  Laterality: Right;  SFA   POSTERIOR FUSION CERVICAL SPINE  2012   PPM GENERATOR REMOVAL N/A 12/14/2020   Procedure: PPM GENERATOR REMOVAL;  Surgeon: Evans Lance, MD;  Location: [place] [facility];  Service: Cardiovascular;  Laterality: N/A;   TEE WITHOUT CARDIOVERSION N/A 12/11/2020   Procedure: TRANSESOPHAGEAL ECHOCARDIOGRAM (TEE);  Surgeon: Geralynn Rile, MD;  Location: [place];  Service: Cardiovascular;  Laterality: N/A;   TRIGGER FINGER RELEASE  2011   4th finger left hand   VIDEO ASSISTED THORACOSCOPY Left 12/26/2016   Procedure: VIDEO ASSISTED THORACOSCOPY;  Surgeon: [name], MD;  Location: [place];  Service: Thoracic;  Laterality: Left;   VIDEO BRONCHOSCOPY N/A 12/26/2016   Procedure: VIDEO BRONCHOSCOPY;  Surgeon: [name], MD;  Location: [place];  Service: Thoracic;  Laterality: N/A;   Patient Active Problem List   Diagnosis Date Noted   Intermediate stage nonexudative age-related macular degeneration of both eyes 04/25/2022   Research study patient 12/24/2021   Post-operative pain    CKD (chronic kidney disease), stage II    Labile blood glucose    Pulmonary fibrosis ([place])    Hyponatremia    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[place]) 03/26/2021   S/P BKA (below knee amputation) unilateral, right ([place])    Shortness of breath    Acute blood loss anemia    Stage 3 chronic kidney disease ([place])    Atrial fibrillation ([place])    Controlled type 2 diabetes mellitus with hyperglycemia, with long-term current use of insulin (H[facility])    Postoperative pain    Postoperative hemorrhagic shock  03/20/2021   Atherosclerosis of native arteries of extremities with gangrene, right leg ([place])    Hardware complicating wound infection ([place])    Abscess of bursa of right ankle 02/15/2021   Endocarditis 01/19/2021   Peripheral vascular disease ([place]) 12/29/2020   CHF (congestive heart failure), NYHA class II, chronic, diastolic ([place]) 41/63/8453   History of COVID-19 12/22/2020   SSS (sick sinus syndrome) ([place]) 12/22/2020   Acute bacterial endocarditis    MSSA bacteremia 12/09/2020   Diabetic foot ulcer ([place]) 12/09/2020   Foot drop, right foot    Generalized weakness 12/08/2020   Dehydration with hyponatremia 12/08/2020   Atrial fibrillation, chronic ([place]) 12/08/2020   Elevated troponin level not due myocardial infarction  12/08/2020   Adrenal insufficiency (HCC) 11/14/2020   Orthostatic hypotension 11/14/2020   Physical deconditioning 11/14/2020   Difficulty sleeping 11/07/2020   Postoperative anemia due to acute blood loss 11/07/2020   Right ventricular dysfunction 11/05/2020   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/04/2020   Hearing loss 11/03/2020   Cardiac device in situ 09/09/2020   Coronary atherosclerosis due to lipid rich plaque 09/02/2020   COVID-19 virus infection 07/18/2020   Coronary artery disease involving native heart without angina pectoris 07/10/2020   Chronic kidney disease, stage 3a ([place]) 03/29/2020   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/28/2020   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 ([place]) 03/16/2020   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 ([place]) 03/16/2020   Right epiretinal membrane 03/16/2020   Posterior vitreous detachment of right eye 03/16/2020   Aortic stenosis, moderate 03/12/2020   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   Near syncope 04/27/2018   Hypoglycemia due to insulin 01/06/2018   Essential hypertension 01/06/2018   Diabetes mellitus type 2, with  complication, on long term insulin pump ([place]) 01/06/2018   Syncope and collapse 01/05/2018   Encounter for therapeutic drug monitoring 06/29/2017   OSA on CPAP 05/05/2017   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[place]) 01/05/2017   Abnormal chest x-ray 10/12/2016   Benign neoplasm of colon 01/14/2013    REFERRING DIAG: Z89.511 (ICD-10-CM) - Hx of right BKA  ONSET DATE: 05/04/2022 MD visit with release to WB on LLE  THERAPY DIAG:  Unsteadiness on feet  Other abnormalities of gait and mobility  Muscle weakness (generalized)  Abnormal posture  Acute pain of left shoulder  Stiffness of left shoulder, not elsewhere classified  Localized edema  Rationale for Evaluation and Treatment Rehabilitation  PERTINENT HISTORY: CKD st III, AV block Mobitz, pacemaker, pulmonary fibrosis, CAD, CABG 21, HTN, A-Fib, DM2, PAD, right BKA, Left TMA  PRECAUTIONS: ICD/Pacemaker - continue monitoring vitals with activity and calculated HRmax is 151 bpm  SUBJECTIVE: He noticed a small spot on right limb this morning. It was not there when he took prosthesis off last night so he thinks it may be dog that they are watching.  His shoulder is getting better and he is doing his exercises.   PAIN:    Are you having pain? Yes: NPRS scale: today  0/10, over last week lowest 0/10  Pain location: left shoulder lateral just distal to joint Pain description: tingling when shoulder in improper posture. Aggravating factors: improper posture. Relieving factors: correcting posture   OBJECTIVE: (objective measures completed at initial evaluation unless otherwise dated)  COGNITION: Overall cognitive status: Within functional limits for tasks assessed   POSTURE: 05/09/2022:  rounded shoulders, forward head, and flexed trunk    LOWER EXTREMITY ROM:    UPPER EXTREMITY ROM:  ROM P:passive  A:active Left 06/23/22 Supine all limited by pain  Shoulder flexion 30*  Shoulder extension 0* neutral  Shoulder abduction  45*  Shoulder adduction   Shoulder internal rotation 20*  Shoulder external rotation 10*  Elbow flexion   Elbow extension -15*  Wrist flexion   Wrist extension   Wrist ulnar deviation   Wrist radial deviation   Wrist pronation   Wrist supination    (Blank rows = not tested)    ROM P:passive  A:active Right Eval 05/09/2022 Left Eval 05/09/2022  Hip flexion      Hip extension A: -25* standing A: -25* standing  Hip abduction  Hip adduction      Hip internal rotation      Hip external rotation      Knee flexion      Knee extension      Ankle dorsiflexion NA P: -17*  Ankle plantarflexion NA    Ankle inversion NA    Ankle eversion NA     (Blank rows = not tested)   LOWER EXTREMITY MMT:   MMT Right Eval 05/09/2022 Left Eval 05/09/2022  Hip flexion      Hip extension Functional testing 3-/5 Functional testing 3-/5  Hip abduction Functional testing 3-/5 Functional testing 3-/5  Hip adduction      Hip internal rotation      Hip external rotation      Knee flexion Functional testing 3-/5 Functional testing 3-/5  Knee extension Functional testing 3/5 Functional testing 3/5  Ankle dorsiflexion      Ankle plantarflexion      Ankle inversion      Ankle eversion      (Blank rows = not tested)   POSTURE:  06/23/2022  seated - head forward, rounded shoulders and posterior pelvic tilt  TRANSFERS: 05/09/2022:  Sit to stand: SBA and requires use of armrests from 20" stable w/c & needs RW for stabilization 05/09/2022:  Stand to sit: SBA and requires use of armrests from 20" stable w/c & needs RW for stabilization   GAIT: 05/09/2022:  Gait pattern: step to pattern, decreased step length- Left, decreased stance time- Right, Right hip hike, antalgic, lateral hip instability, trunk flexed, and wide BOS Distance walked: 25' Assistive device utilized: Environmental consultant - 2 wheeled and TTA prosthesis RLE Level of assistance: Min A Comments: pt reports dizziness with turning in gait   UE  FUNCTION: 06/23/2022:  Pt reports that he uses RUE for fine motor like writing but uses both hands for gross motor or sports.  Seated - pt unable to raise LUE in flexion or abd due to pain.  Supine - pt unable to move LUE in flexion or abd due to pain.  PROM flexion > pain than abd   Pt tolerates elbow ext with palm supinated but with palm neutral causes shoulder pain. Also painful with elbow already extended and pronate palm.   PT simulated pushing on RW thru LUE with manual resistance.  Pt reports slight increase in pain but not as sharp as above function.    FUNCTIONAL TESTs:  Merrilee Jansky Balance Scale: 05/09/2022:  9/56 & tasks of Berg with RW support 27/56    [facility] PT Assessment - 05/09/22 0930                Berg Balance Test    Sit to Stand Needs moderate or maximal assist to stand   task with RW support = 3    Standing Unsupported Unable to stand 30 seconds unassisted   task with RW support = 3    Sitting with Back Unsupported but Feet Supported on Floor or Stool Able to sit safely and securely 2 minutes     Stand to Sit Uses backs of legs against chair to control descent   task with RW support = 3    Transfers Able to transfer safely, definite need of hands     Standing Unsupported with Eyes Closed Needs help to keep from falling   task with RW support = 3    Standing Unsupported with Feet Together Needs help to attain position and unable to hold for 15 seconds  task with RW support = 2    From Standing, Reach Forward with Outstretched Arm Loses balance while trying/requires external support   task with RW support = 1    From Standing Position, Pick up Object from Floor Unable to try/needs assist to keep balance   task with RW support = 2    From Standing Position, Turn to Look Behind Over each Shoulder Needs assist to keep from losing balance and falling   task with RW support = 1    Turn 360 Degrees Needs assistance while turning   task with RW support = 0    Standing Unsupported,  Alternately Place Feet on Step/Stool Needs assistance to keep from falling or unable to try   task with RW support = 0    Standing Unsupported, One Foot in [employer] balance while stepping or standing   task with RW support = 1    Standing on One Leg Unable to try or needs assist to prevent fall   task with RW support = 1    Total Score 9     Berg comment: tasks of Berg with RW support 27/56                     CURRENT PROSTHETIC WEAR ASSESSMENT: 05/09/2022:   Patient is dependent with: skin check, residual limb care, care of non-amputated limb, prosthetic cleaning, ply sock cleaning, and correct ply sock adjustment Donning prosthesis: Modified independence Doffing prosthesis: Modified independence Prosthetic wear tolerance: >90% of awake hours/day, 7 days/week Prosthetic weight bearing tolerance: 5 minutes Edema: LLE pitting edema with >10 sec capillary refill Residual limb condition: Right BKA limb has no openings, normal temperature & moisture.  Redness patella & patella tendon from friction of liner with knee flexed. Cylinderical shape.  LLE TMA pt's wife showed picture of wound on dorsum of foot that is being managed by PCP.   Prosthetic description: silicon liner with pin lock suspension, total contact socket, dynamic response foot.      TODAY'S TREATMENT:  07/11/2022 Prosthetic Training with right TTA prosthesis & left shoe with toe filler & carbon plate 54m dry blood wound on right medial tibial plateau with no signs of infection.  He arrived with bandaid covering wound & PT explained pressure issue inside the socket.  PT demo & verbal cues to pt & wife on applying & use of Tegaderm when wearing prosthesis and Vivewear shrinker in direct contact with prosthesis off.  Clean prior & after Tegaderm with soap & water.  Pt & wife verbalized understanding.  Pt amb 65' X 2 with RW with contact guard assist. PT demo & verbal cues for pt & wife on technique for how to safely provide  contact assist and if need lower him to floor to prevent injury. Wife walked 20' counting steps to set up that distance for gait at home between w/c & chair with armrests. PT recommended frequency of 4 times during day initially with plan to progress frequency & distance over time. Wife assisted contact guard with PT supervising 20' X 2 with RW. Both verbalized understanding and feel comfortable trying at home.  HR 72 SpO2 97%  Neuromuscular Re-education for balance & upright posture: Standing 5 min total with RW support:  static 1 min ea UE with single UE support with supervision;   Reaching forward with ea UE 5 reps ~5" ea. No pain with LUE reach or support. Contact guard assist.  Trunk rotation to look just  behind shoulder with single UE support 5 reps ea direction with contact guard assist.  HR 77 SpO2 96%  07/07/2022 Neuromuscular re-ed: working on upright balance with shoes //bars UE support A-P weight shift x 1 min and lateral weight shift x 1 //bars full step forward stance 2 x 30s bil., frequent UE touch support -following all standing balance activity, HR 119 SpO2 97%  Therapeutic Exercise: Leg Press back 45* BLEs 125# 25 reps. HR 96 SpO2 97%  Prosthetic Training with right TTA prosthesis & left shoe with toe filler & carbon plate Pt ambulated 30' with 180* turn & 72' with RW with supervision, with cueing for upright posture and short steps to prevent shoe sliding. No dizziness or SOB noted.  Following 55' - HR 108 SpO2 99% PT educated for compression sock fit over foot and wearing Vivewear sock with wound care properties compared to std compression sock. pt and wife verbalized understanding  07/05/2022 Resting 72 SpO2 96% Therapeutic Exercise: Pt performed 1 set of 10 of supine wand exercises for comprehension with minimal discomfort.    07/05/22 0935  PT Education  Education Details Access Code: [email or web address] updated & reviewed  Person(s) Educated Patient;Spouse  Methods  Explanation;Demonstration;Tactile cues;Verbal cues;Handout  Comprehension Verbalized understanding;Returned demonstration;Need further instruction   Access Code: [email or web address] URL: [email or web address] Date: 07/05/2022 Prepared by: Jamey Reas  Exercises - Seated Correct Posture  - 1 x daily - 7 x weekly - Seated Scapular Retraction  - 2-5 x daily - 7 x weekly - 1 sets - 10 reps - 5 seconds hold - Supine Chin Tucks on Flat Ball  - 2-5 x daily - 7 x weekly - 1 sets - 10 reps - 5 seconds hold - Seated Isometric Cervical Retraction with Chin Tucks with Pillow behind head  - 2-5 x daily - 7 x weekly - 1 sets - 10 reps - 5 seconds hold - Circular Shoulder Pendulum with Table Support  - 1-2 x daily - 7 x weekly - 1-2 sets - 10 reps - 5 seconds hold - Flexion-Extension Shoulder Pendulum with Table Support  - 1-2 x daily - 7 x weekly - 1-2 sets - 10 reps - Horizontal Shoulder Pendulum with Table Support  - 1-2 x daily - 7 x weekly - 1-2 sets - 10 reps - Supine Shoulder Flexion Extension AAROM with Dowel  - 1 x daily - 7 x weekly - 1-2 sets - 10 reps - 5 seconds hold - Supine Shoulder Abduction AAROM with Dowel  - 1 x daily - 7 x weekly - 1-2 sets - 10 reps - 5 seconds hold - Supine Shoulder External Rotation with Dowel  - 1 x daily - 7 x weekly - 1-2 sets - 10 reps - 5 seconds hold    06/23/22 1413  PT Education  Education Details Access Code: [email or web address]  Person(s) Educated Patient;Spouse  Methods Explanation;Handout;Demonstration;Tactile cues;Verbal cues  Comprehension Verbalized understanding;Returned demonstration;Verbal cues required;Tactile cues required;Need further instruction   Access Code: [email or web address] URL: [email or web address] Date: 06/23/2022 Prepared by: Jamey Reas  Exercises - Seated Correct Posture  - 1 x daily - 7 x weekly - Seated Scapular Retraction  - 2-5 x daily - 7 x weekly - 1 sets - 10 reps - 5 seconds hold - Supine Chin Tucks on Flat Ball  -  2-5 x daily - 7 x weekly - 1 sets - 10 reps - 5 seconds hold - Seated Isometric Cervical Retraction with Chin Tucks with Pillow behind head  -  2-5 x daily - 7 x weekly - 1 sets - 10 reps - 5 seconds hold   05/11/22 1636  PT Education  Education Details HEP at sink & sitting on bar stool to develop back strength / endurance  Person(s) Educated Patient;Spouse  Methods Explanation;Demonstration;Tactile cues;Verbal cues;Handout  Comprehension Verbalized understanding;Returned demonstration;Verbal cues required;Tactile cues required;Need further instruction   Neuromuscular Reed-  cues on proprioception RLE using residual limb, balance & standing tolerance.   Do each exercise 1-2  times per day Do each exercise 5-10 repetitions Hold each exercise for 2 seconds to feel your location  AT [place].  Try to find this position when standing still for activities.   USE TAPE ON FLOOR TO MARK THE MIDLINE POSITION which is even with middle of sink.  You also should try to feel with your limb pressure in socket.  You are trying to feel with limb what you used to feel with the bottom of your foot.  Side to Side Shift: Moving your hips only (not shoulders): move weight onto your left leg, HOLD/FEEL pressure in socket.  Move back to equal weight on each leg, HOLD/FEEL pressure in socket. Move weight onto your right leg, HOLD/FEEL pressure in socket. Move back to equal weight on each leg, HOLD/FEEL pressure in socket. Repeat.  Start with both hands on sink, progress to hand on prosthetic side only, then no hands.  Front to Back Shift: Moving your hips only (not shoulders): move your weight forward onto your toes, HOLD/FEEL pressure in socket. Move your weight back to equal Flat Foot on both legs, HOLD/FEEL  pressure in socket. Move your weight back onto your heels, HOLD/FEEL  pressure in socket. Move your weight back to equal on both legs,  HOLD/FEEL  pressure in socket. Repeat.  Start with both hands on sink, progress to hand on prosthetic side only, then no hands.  Moving Cones / Cups: With equal weight on each leg: Hold on with one hand the first time, then progress to no hand supports. Move cups from one side of sink to the other. Place cups ~2" out of your reach, progress to 10" beyond reach.  Place one hand in middle of sink and reach with other hand. Do both arms.  Then hover one hand and move cups with other hand.  Overhead/Upward Reaching: alternated reaching up to top cabinets or ceiling if no cabinets present. Keep equal weight on each leg. Start with one hand support on counter while other hand reaches and progress to no hand support with reaching.  ace one hand in middle of sink and reach with other hand. Do both arms.  Then hover one hand and move cups with other hand.  5.   Looking Over Shoulders: With equal weight on each leg: alternate turning to look over your shoulders with one hand support on counter as needed.  Start with head motions only to look in front of shoulder, then even with shoulder and progress to looking behind you. To look to side, move head /eyes, then shoulder on side looking pulls back, shift more weight to side looking and pull hip back. Place one hand in middle of sink and let go with other hand so your shoulder can pull back. Switch hands to look other way.   Then hover one hand and look over shoulder. If looking right, use left hand at sink. If looking left, use right hand at  sink. 6.  Alternate Tapping Toes or Stepping to lower shelf of cabinet  Move items under cabinet out of your way. Shift your hips/pelvis so weight on stance leg & Tighten muscles in hip.  SLOWLY step other leg so front of foot is in cabinet. Then step back to floor. Repeat with other leg.      ASSESSMENT:   CLINICAL IMPRESSION: Pt & wife appear to understand initial gait with RW to try in home for 20' and they appear safe.  His  shoulder pain & use appear to have improved and is not limiting his function.  The wound on residual limb seems from dog scratch and they appear to understand PT recommendations for care.   OBJECTIVE IMPAIRMENTS .Abnormal gait, cardiopulmonary status limiting activity, decreased activity tolerance, decreased balance, decreased endurance, decreased knowledge of use of DME, decreased mobility, decreased ROM, decreased strength, dizziness, increased edema, impaired flexibility, postural dysfunction, prosthetic dependency , obesity, and pain.    ACTIVITY LIMITATIONS carrying, lifting, bending, standing, stairs, transfers, locomotion level, UE use with ADLs, sleep    PARTICIPATION LIMITATIONS: driving, community activity, and yard work, UE use with ADLs &  household activities   [place], Time since onset of injury/illness/exacerbation, and 3+ comorbidities: see PMH  are also affecting patient's functional outcome.    REHAB POTENTIAL: Good   CLINICAL DECISION MAKING: Evolving/moderate complexity   EVALUATION COMPLEXITY: Moderate     GOALS: Goals reviewed with patient? Yes  SHORT TERM GOALS: Target date: 07/14/2022  1. Patient able to stand with no UE support for 1 min with supervision. Baseline: SEE OBJECTIVE DATA Goal status: ongoing 07/11/22   2. Patient independent with donning/doffing new AFO or prosthesis. Baseline: SEE OBJECTIVE DATA Goal status: ongoing 07/11/22 as he has not received AFO yet  3. Patient demo proper sitting posture for left shoulder pain & function.  Goal status: MET 07/11/2022  4. Left shoulder pain reported </= 7/10 Goal status: MET 07/11/2022   LONG TERM GOALS: Target date: 08/04/2022   Patient demonstrates & verbalized understanding of prosthetic care to enable safe utilization of prosthesis. Baseline: SEE OBJECTIVE DATA Goal status: INITIAL   Patient tolerates right prosthesis & left AFO / partial foot prosthesis wear >90% of awake hours without  skin or limb pain issues. Baseline: SEE OBJECTIVE DATA Goal status: INITIAL   Berg Balance >/= 20/56 and tasks of Berg with RW support >/= 45/56 to indicate lower fall risk with standing ADLs Baseline: SEE OBJECTIVE DATA Goal status: INITIAL   Patient ambulates >300' with prosthesis & LRAD modified independent Baseline: SEE OBJECTIVE DATA Goal status: INITIAL   Patient negotiates ramps, curbs & stairs with single rail with prosthesis & LRAD modified independent. Baseline: SEE OBJECTIVE DATA Goal status: INITIAL  6. Left shoulder pain </= 3/10 with functional activities.  Goal status: new 06/23/2022  7. Left shoulder AROM within 75% of Right shoulder  Goal status: new 06/23/2022  8.patient able to use LUE for ADLs, gait, transfers, etc. Safely  Goal status: new 06/23/2022     PLAN: PT FREQUENCY: 2x/week   PT DURATION: 13 weeks (90 days);   shoulder included on 06/23/2022 for 6 weeks.    PLANNED INTERVENTIONS: Therapeutic exercises, Therapeutic activity, Neuromuscular re-education, Balance training, Gait training, Patient/Family education, Self Care, Joint mobilization, Stair training, Vestibular training, Canalith repositioning, Prosthetic training, DME instructions, Dry Needling, Electrical stimulation, Cryotherapy, Moist heat, Taping, Vasopneumatic device, Ultrasound, Manual therapy, Re-evaluation, and physical performance testing    PLAN FOR NEXT SESSION:  continue to monitor HR & SpO2 with activities, check STG for gait ? distance, check how pt and wife in supervision for walking at home is going, standing balance tasks with shoe on, LE stretches for upright posture at counter  Jamey Reas, PT, DPT 07/11/2022, 8:42 AM

## 2022-07-13 ENCOUNTER — Ambulatory Visit (INDEPENDENT_AMBULATORY_CARE_PROVIDER_SITE_OTHER): Payer: Medicare Other | Admitting: Physical Therapy

## 2022-07-13 ENCOUNTER — Encounter: Payer: Self-pay | Admitting: Physical Therapy

## 2022-07-13 DIAGNOSIS — M25612 Stiffness of left shoulder, not elsewhere classified: Secondary | ICD-10-CM

## 2022-07-13 DIAGNOSIS — R293 Abnormal posture: Secondary | ICD-10-CM

## 2022-07-13 DIAGNOSIS — R2681 Unsteadiness on feet: Secondary | ICD-10-CM | POA: Diagnosis not present

## 2022-07-13 DIAGNOSIS — M6281 Muscle weakness (generalized): Secondary | ICD-10-CM | POA: Diagnosis not present

## 2022-07-13 DIAGNOSIS — R600 Localized edema: Secondary | ICD-10-CM

## 2022-07-13 DIAGNOSIS — R2689 Other abnormalities of gait and mobility: Secondary | ICD-10-CM

## 2022-07-13 DIAGNOSIS — M25512 Pain in left shoulder: Secondary | ICD-10-CM

## 2022-07-13 NOTE — Therapy (Signed)
OUTPATIENT PHYSICAL THERAPY TREATMENT NOTE   Patient Name: Shane [name] MRN: [id number] DOB:29-May-1941, 81 y.o., male Today's Date: 07/13/2022  PCP: Burnard Bunting, MD REFERRING PROVIDER: [name], MD for TTA & [name], NP for left shoulder   END OF SESSION:   PT End of Session - 07/13/22 0808     Visit Number 18    Number of Visits 26    Date for PT Re-Evaluation 08/04/22    Authorization Type UHC Medicare    Authorization Time Period [facility] MEDICARE  $20 COPAY    Progress Note Due on Visit 20    PT Start Time 0803    PT Stop Time 0841    PT Time Calculation (min) 38 min    Equipment Utilized During Treatment --    Activity Tolerance Patient tolerated treatment well;Patient limited by fatigue    Behavior During Therapy WFL for tasks assessed/performed             Past Medical History:  Diagnosis Date   Anxiety    AV block, Mobitz 1    Cataract    Chronic kidney disease    d/t DM   CKD (chronic kidney disease), stage III ([place])    Depression    Diabetes mellitus    Vgo disposal insulin bolus  simular to insulin pump   Dyspnea    GERD (gastroesophageal reflux disease)    History of kidney stones    passed   Hyperlipidemia    Hypertension    Idiopathic pulmonary fibrosis ([place]) 11/2016   ILD (interstitial lung disease) ([place])    Moderate aortic stenosis    a. 10/2019 Echo: EF 55-60%, Gr2 DD. Nl RV.    Neuromuscular disorder ([place])    Neuropathy associated with endocrine disorder ([place])    Nonobstructive CAD (coronary artery disease)    a. 2012 Cath: mod, nonobs dzs; b. 10/2016 MV: EF 60%, no ischemia.   OSA on CPAP 05/05/2017   Unattended Home Sleep Test 7/2/813-AHI 38.6/hour, desaturation to 64%, body weight 261 pounds   PONV (postoperative nausea and vomiting)    Sleep apnea     uses cpap asked to bring mask and tubing   Past Surgical History:  Procedure Laterality Date   ABDOMINAL AORTOGRAM W/LOWER EXTREMITY N/A 12/10/2020   Procedure:  ABDOMINAL AORTOGRAM W/LOWER EXTREMITY;  Surgeon: Cherre Robins, MD;  Location: [place] [facility];  Service: Cardiovascular;  Laterality: N/A;   AMPUTATION Right 01/22/2021   Procedure: 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;  Surgeon: [name], MD;  Location: [place];  Service: Orthopedics;  Laterality: Right;   AMPUTATION Right 03/17/2021   Procedure: RIGHT BELOW KNEE AMPUTATION;  Surgeon: [name], MD;  Location: [place];  Service: Orthopedics;  Laterality: Right;   ANKLE FUSION Right 01/22/2021   Procedure: RIGHT FOOT TIBIOCALCANEAL FUSION;  Surgeon: [name], MD;  Location: [place];  Service: Orthopedics;  Laterality: Right;   ANTERIOR FUSION CERVICAL SPINE  2012   CARDIAC CATHETERIZATION  2011   CARDIAC CATHETERIZATION N/A 11/09/2016   Procedure: Right Heart Cath;  Surgeon: Belva Crome, MD;  Location: [place] [facility];  Service: Cardiovascular;  Laterality: N/A;   carpel tunnel     left wrist   CATARACT EXTRACTION     CATARACT EXTRACTION W/ INTRAOCULAR LENS  IMPLANT, BILATERAL  2013   CERVICAL LAMINECTOMY  2012   COLONOSCOPY N/A 01/14/2013   Procedure: COLONOSCOPY;  Surgeon: Irene Shipper, MD;  Location: [facility];  Service: Endoscopy;  Laterality: N/A;   CORONARY ARTERY BYPASS GRAFT  11/04/2020   LIMA-LAD, SVG-OM1, SVG-PDA (Dr [facility]) dc 11/18/2020   EYE SURGERY     I & D EXTREMITY Right 02/19/2021   Procedure: RIGHT ANKLE DEBRIDEMENT AND PLACEMENT ANTIBIOTIC BEADS;  Surgeon: [name], MD;  Location: [place];  Service: Orthopedics;  Laterality: Right;   KNEE SURGERY  1998   left   LEFT HEART CATH AND CORONARY ANGIOGRAPHY N/A 07/10/2020   Procedure: LEFT HEART CATH AND CORONARY ANGIOGRAPHY;  Surgeon: Sherren Mocha, MD;  Location: [place] [facility];  Service: Cardiovascular;  Laterality: N/A;   LUMBAR LAMINECTOMY  2003   LUNG BIOPSY Left 12/26/2016   Procedure: LUNG BIOPSY;  Surgeon: [name], MD;  Location: [place];  Service: Thoracic;  Laterality: Left;    PACEMAKER IMPLANT N/A 03/30/2020   Procedure: PACEMAKER IMPLANT;  Surgeon: Evans Lance, MD;  Location: [place] [facility];  Service: Cardiovascular;  Laterality: N/A;   PERIPHERAL VASCULAR INTERVENTION Right 12/10/2020   Procedure: PERIPHERAL VASCULAR INTERVENTION;  Surgeon: Cherre Robins, MD;  Location: [place] [facility];  Service: Cardiovascular;  Laterality: Right;  SFA   POSTERIOR FUSION CERVICAL SPINE  2012   PPM GENERATOR REMOVAL N/A 12/14/2020   Procedure: PPM GENERATOR REMOVAL;  Surgeon: Evans Lance, MD;  Location: [place] [facility];  Service: Cardiovascular;  Laterality: N/A;   TEE WITHOUT CARDIOVERSION N/A 12/11/2020   Procedure: TRANSESOPHAGEAL ECHOCARDIOGRAM (TEE);  Surgeon: Geralynn Rile, MD;  Location: [place];  Service: Cardiovascular;  Laterality: N/A;   TRIGGER FINGER RELEASE  2011   4th finger left hand   VIDEO ASSISTED THORACOSCOPY Left 12/26/2016   Procedure: VIDEO ASSISTED THORACOSCOPY;  Surgeon: [name], MD;  Location: [place];  Service: Thoracic;  Laterality: Left;   VIDEO BRONCHOSCOPY N/A 12/26/2016   Procedure: VIDEO BRONCHOSCOPY;  Surgeon: [name], MD;  Location: [place];  Service: Thoracic;  Laterality: N/A;   Patient Active Problem List   Diagnosis Date Noted   Intermediate stage nonexudative age-related macular degeneration of both eyes 04/25/2022   Research study patient 12/24/2021   Post-operative pain    CKD (chronic kidney disease), stage II    Labile blood glucose    Pulmonary fibrosis ([place])    Hyponatremia    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[place]) 03/26/2021   S/P BKA (below knee amputation) unilateral, right ([place])    Shortness of breath    Acute blood loss anemia    Stage 3 chronic kidney disease ([place])    Atrial fibrillation ([place])    Controlled type 2 diabetes mellitus with hyperglycemia, with long-term current use of insulin ([facility])    Postoperative pain    Postoperative hemorrhagic shock 03/20/2021   Atherosclerosis  of native arteries of extremities with gangrene, right leg ([place])    Hardware complicating wound infection ([place])    Abscess of bursa of right ankle 02/15/2021   Endocarditis 01/19/2021   Peripheral vascular disease ([place]) 12/29/2020   CHF (congestive heart failure), NYHA class II, chronic, diastolic ([place]) 56/25/6389   History of COVID-19 12/22/2020   SSS (sick sinus syndrome) ([place]) 12/22/2020   Acute bacterial endocarditis    MSSA bacteremia 12/09/2020   Diabetic foot ulcer ([place]) 12/09/2020   Foot drop, right foot    Generalized weakness 12/08/2020   Dehydration with hyponatremia 12/08/2020   Atrial fibrillation, chronic ([place]) 12/08/2020   Elevated troponin level not due myocardial infarction 12/08/2020  Adrenal insufficiency ([place]) 11/14/2020   Orthostatic hypotension 11/14/2020   Physical deconditioning 11/14/2020   Difficulty sleeping 11/07/2020   Postoperative anemia due to acute blood loss 11/07/2020   Right ventricular dysfunction 11/05/2020   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/04/2020   Hearing loss 11/03/2020   Cardiac device in situ 09/09/2020   Coronary atherosclerosis due to lipid rich plaque 09/02/2020   COVID-19 virus infection 07/18/2020   Coronary artery disease involving native heart without angina pectoris 07/10/2020   Chronic kidney disease, stage 3a ([place]) 03/29/2020   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/28/2020   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 ([place]) 03/16/2020   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 ([place]) 03/16/2020   Right epiretinal membrane 03/16/2020   Posterior vitreous detachment of right eye 03/16/2020   Aortic stenosis, moderate 03/12/2020   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   Near syncope 04/27/2018   Hypoglycemia due to insulin 01/06/2018   Essential hypertension 01/06/2018   Diabetes mellitus type 2, with complication, on long term insulin pump  ([place]) 01/06/2018   Syncope and collapse 01/05/2018   Encounter for therapeutic drug monitoring 06/29/2017   OSA on CPAP 05/05/2017   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[place]) 01/05/2017   Abnormal chest x-ray 10/12/2016   Benign neoplasm of colon 01/14/2013    REFERRING DIAG: Z89.511 (ICD-10-CM) - Hx of right BKA  ONSET DATE: 05/04/2022 MD visit with release to [facility] on LLE  THERAPY DIAG:  Unsteadiness on feet  Other abnormalities of gait and mobility  Muscle weakness (generalized)  Abnormal posture  Acute pain of left shoulder  Stiffness of left shoulder, not elsewhere classified  Localized edema  Rationale for Evaluation and Treatment Rehabilitation  PERTINENT HISTORY: CKD st III, AV block Mobitz, pacemaker, pulmonary fibrosis, CAD, CABG 21, HTN, A-Fib, DM2, PAD, right BKA, Left TMA  PRECAUTIONS: ICD/Pacemaker - continue monitoring vitals with activity and calculated HRmax is 151 bpm  SUBJECTIVE: He got the Lt dynamic AFO yesterday noting no skin concerns, but feeling some increased pressure/soreness on medial anterior tibial plateau at the top of the AFO plate. The spot on the residual limb is looking better and they are using Tegaderm as PT directed. He has a small tear on his Rt wrist with a sticky coban wrap around a square piece of gauze. He is walking at home in 25' intervals with wife as PT directed and walked at the prosthetist in //bars with no complaints or losses of balance. He and his wife report the walker being bent or uneven, but still usable with an adjustment to the leg length. They also have a rollator at home that is currently unused.  PAIN:    Are you having pain? Yes: NPRS scale: today  0/10, over last week lowest 0/10  Pain location: left shoulder lateral just distal to joint Pain description: tingling when shoulder in improper posture. Aggravating factors: improper posture. Relieving factors: correcting posture   OBJECTIVE: (objective measures  completed at initial evaluation unless otherwise dated)  COGNITION: Overall cognitive status: Within functional limits for tasks assessed   POSTURE: 05/09/2022:  rounded shoulders, forward head, and flexed trunk    LOWER EXTREMITY ROM:    UPPER EXTREMITY ROM:  ROM P:passive  A:active Left 06/23/22 Supine all limited by pain  Shoulder flexion 30*  Shoulder extension 0* neutral  Shoulder abduction 45*  Shoulder adduction   Shoulder internal rotation 20*  Shoulder external  rotation 10*  Elbow flexion   Elbow extension -15*  Wrist flexion   Wrist extension   Wrist ulnar deviation   Wrist radial deviation   Wrist pronation   Wrist supination    (Blank rows = not tested)    ROM P:passive  A:active Right Eval 05/09/2022 Left Eval 05/09/2022  Hip flexion      Hip extension A: -25* standing A: -25* standing  Hip abduction      Hip adduction      Hip internal rotation      Hip external rotation      Knee flexion      Knee extension      Ankle dorsiflexion NA P: -17*  Ankle plantarflexion NA    Ankle inversion NA    Ankle eversion NA     (Blank rows = not tested)   LOWER EXTREMITY MMT:   MMT Right Eval 05/09/2022 Left Eval 05/09/2022  Hip flexion      Hip extension Functional testing 3-/5 Functional testing 3-/5  Hip abduction Functional testing 3-/5 Functional testing 3-/5  Hip adduction      Hip internal rotation      Hip external rotation      Knee flexion Functional testing 3-/5 Functional testing 3-/5  Knee extension Functional testing 3/5 Functional testing 3/5  Ankle dorsiflexion      Ankle plantarflexion      Ankle inversion      Ankle eversion      (Blank rows = not tested)   POSTURE:  06/23/2022  seated - head forward, rounded shoulders and posterior pelvic tilt  TRANSFERS: 05/09/2022:  Sit to stand: SBA and requires use of armrests from 20" stable w/c & needs RW for stabilization 05/09/2022:  Stand to sit: SBA and requires use of armrests from 20" stable  w/c & needs RW for stabilization   GAIT: 05/09/2022:  Gait pattern: step to pattern, decreased step length- Left, decreased stance time- Right, Right hip hike, antalgic, lateral hip instability, trunk flexed, and wide BOS Distance walked: 25' Assistive device utilized: Environmental consultant - 2 wheeled and TTA prosthesis RLE Level of assistance: Min A Comments: pt reports dizziness with turning in gait   UE FUNCTION: 06/23/2022:  Pt reports that he uses RUE for fine motor like writing but uses both hands for gross motor or sports.  Seated - pt unable to raise LUE in flexion or abd due to pain.  Supine - pt unable to move LUE in flexion or abd due to pain.  PROM flexion > pain than abd   Pt tolerates elbow ext with palm supinated but with palm neutral causes shoulder pain. Also painful with elbow already extended and pronate palm.   PT simulated pushing on RW thru LUE with manual resistance.  Pt reports slight increase in pain but not as sharp as above function.    FUNCTIONAL TESTs:  Merrilee Jansky Balance Scale: 05/09/2022:  9/56 & tasks of Berg with RW support 27/56    [facility] PT Assessment - 05/09/22 0930                Berg Balance Test    Sit to Stand Needs moderate or maximal assist to stand   task with RW support = 3    Standing Unsupported Unable to stand 30 seconds unassisted   task with RW support = 3    Sitting with Back Unsupported but Feet Supported on Floor or Stool Able to sit safely and securely 2 minutes  Stand to Sit Uses backs of legs against chair to control descent   task with RW support = 3    Transfers Able to transfer safely, definite need of hands     Standing Unsupported with Eyes Closed Needs help to keep from falling   task with RW support = 3    Standing Unsupported with Feet Together Needs help to attain position and unable to hold for 15 seconds   task with RW support = 2    From Standing, Reach Forward with Outstretched Arm Loses balance while trying/requires external support   task  with RW support = 1    From Standing Position, Pick up Object from Floor Unable to try/needs assist to keep balance   task with RW support = 2    From Standing Position, Turn to Look Behind Over each Shoulder Needs assist to keep from losing balance and falling   task with RW support = 1    Turn 360 Degrees Needs assistance while turning   task with RW support = 0    Standing Unsupported, Alternately Place Feet on Step/Stool Needs assistance to keep from falling or unable to try   task with RW support = 0    Standing Unsupported, One Foot in [employer] balance while stepping or standing   task with RW support = 1    Standing on One Leg Unable to try or needs assist to prevent fall   task with RW support = 1    Total Score 9     Berg comment: tasks of Berg with RW support 27/56                     CURRENT PROSTHETIC WEAR ASSESSMENT: 05/09/2022:   Patient is dependent with: skin check, residual limb care, care of non-amputated limb, prosthetic cleaning, ply sock cleaning, and correct ply sock adjustment Donning prosthesis: Modified independence Doffing prosthesis: Modified independence Prosthetic wear tolerance: >90% of awake hours/day, 7 days/week Prosthetic weight bearing tolerance: 5 minutes Edema: LLE pitting edema with >10 sec capillary refill Residual limb condition: Right BKA limb has no openings, normal temperature & moisture.  Redness patella & patella tendon from friction of liner with knee flexed. Cylinderical shape.  LLE TMA pt's wife showed picture of wound on dorsum of foot that is being managed by PCP.   Prosthetic description: silicon liner with pin lock suspension, total contact socket, dynamic response foot.      TODAY'S TREATMENT:  07/13/2022 Prosthetic Training with right TTA prosthesis & left dynamic AFO and custom shoe with toe filler and carbon plate Resting vitals: SpO2 97% HR 61 bpm PT assessed fit of AFO and educated about proper donning and fit of AFO and shoe  with visual and verbal demo. PT suggested notifying prosthetist of anterior medial proximal tibia soreness with possibility of padding or relief on anterior plate in that area. Pt and wife verbalized understanding PT suggested bringing in walker and rollator for assessment and training of use, pt and wife verbalized willingness to bring them in Static standing 2 min supervision trying to lift hands from RW, required fingertip BUE support on RW to maintain balance and longest time was 19 sec without UE contact PT educated on safe set up of counter behind him to practice standing without UE support at home, pt verbalized understanding and stood 2 min with fingertip UE support on RW, trying to lift hands from walker Sit to/from stand from 18" chair without  armrests x 2, required visual demo and verbal explanation of form and proper UE use for safety Standing hip flexor, chest, anterior shoulder stretch at counter for upright posturing x 3 min with cervical retraction x 5 reps Pt walked 25' x 2 with RW and SBA, observed shorter stance time on Lt foot with foot flat contact on Rt, LLE adduction Pt walked 105' with RW and SBA with visual target for foot placement, improved Lt foot placement and Rt foot flat contact Vitals following 105' walk: SpO2 99% HR 107 bpm  Self Care: PT advised wrapping wound with gauze without bandage wrap directly touching skin to prevent wounds from removal of bandage, pt and wife verbalized understanding  07/11/2022 Prosthetic Training with right TTA prosthesis & left shoe with toe filler & carbon plate 54m dry blood wound on right medial tibial plateau with no signs of infection.  He arrived with bandaid covering wound & PT explained pressure issue inside the socket.  PT demo & verbal cues to pt & wife on applying & use of Tegaderm when wearing prosthesis and Vivewear shrinker in direct contact with prosthesis off.  Clean prior & after Tegaderm with soap & water.  Pt & wife  verbalized understanding.  Pt amb 65' X 2 with RW with contact guard assist. PT demo & verbal cues for pt & wife on technique for how to safely provide contact assist and if need lower him to floor to prevent injury. Wife walked 20' counting steps to set up that distance for gait at home between w/c & chair with armrests. PT recommended frequency of 4 times during day initially with plan to progress frequency & distance over time. Wife assisted contact guard with PT supervising 20' X 2 with RW. Both verbalized understanding and feel comfortable trying at home.  HR 72 SpO2 97%  Neuromuscular Re-education for balance & upright posture: Standing 5 min total with RW support:  static 1 min ea UE with single UE support with supervision;   Reaching forward with ea UE 5 reps ~5" ea. No pain with LUE reach or support. Contact guard assist.  Trunk rotation to look just behind shoulder with single UE support 5 reps ea direction with contact guard assist.  HR 77 SpO2 96%  07/07/2022 Neuromuscular re-ed: working on upright balance with shoes //bars UE support A-P weight shift x 1 min and lateral weight shift x 1 //bars full step forward stance 2 x 30s bil., frequent UE touch support -following all standing balance activity, HR 119 SpO2 97%  Therapeutic Exercise: Leg Press back 45* BLEs 125# 25 reps. HR 96 SpO2 97%  Prosthetic Training with right TTA prosthesis & left shoe with toe filler & carbon plate Pt ambulated 30' with 180* turn & 5108 with RW with supervision, with cueing for upright posture and short steps to prevent shoe sliding. No dizziness or SOB noted.  Following 55' - HR 108 SpO2 99% PT educated for compression sock fit over foot and wearing Vivewear sock with wound care properties compared to std compression sock. pt and wife verbalized understanding  HOME EXERCISE PROGRAM   07/05/22 0935  PT Education  Education Details Access Code: [email or web address] & reviewed  Person(s) Educated  Patient;Spouse  Methods Explanation;Demonstration;Tactile cues;Verbal cues;Handout  Comprehension Verbalized understanding;Returned demonstration;Need further instruction   Access Code: [email or web address]: [email or web address] Date: 07/05/2022 Prepared by: RJamey Reas Exercises - Seated Correct Posture  - 1 x daily - 7 x weekly - Seated Scapular Retraction  -  2-5 x daily - 7 x weekly - 1 sets - 10 reps - 5 seconds hold - Supine Chin Tucks on Flat Ball  - 2-5 x daily - 7 x weekly - 1 sets - 10 reps - 5 seconds hold - Seated Isometric Cervical Retraction with Chin Tucks with Pillow behind head  - 2-5 x daily - 7 x weekly - 1 sets - 10 reps - 5 seconds hold - Circular Shoulder Pendulum with Table Support  - 1-2 x daily - 7 x weekly - 1-2 sets - 10 reps - 5 seconds hold - Flexion-Extension Shoulder Pendulum with Table Support  - 1-2 x daily - 7 x weekly - 1-2 sets - 10 reps - Horizontal Shoulder Pendulum with Table Support  - 1-2 x daily - 7 x weekly - 1-2 sets - 10 reps - Supine Shoulder Flexion Extension AAROM with Dowel  - 1 x daily - 7 x weekly - 1-2 sets - 10 reps - 5 seconds hold - Supine Shoulder Abduction AAROM with Dowel  - 1 x daily - 7 x weekly - 1-2 sets - 10 reps - 5 seconds hold - Supine Shoulder External Rotation with Dowel  - 1 x daily - 7 x weekly - 1-2 sets - 10 reps - 5 seconds hold    06/23/22 1413  PT Education  Education Details Access Code: [email or web address]  Person(s) Educated Patient;Spouse  Methods Explanation;Handout;Demonstration;Tactile cues;Verbal cues  Comprehension Verbalized understanding;Returned demonstration;Verbal cues required;Tactile cues required;Need further instruction   Access Code: [email or web address] URL: [email or web address] Date: 06/23/2022 Prepared by: Jamey Reas  Exercises - Seated Correct Posture  - 1 x daily - 7 x weekly - Seated Scapular Retraction  - 2-5 x daily - 7 x weekly - 1 sets - 10 reps - 5 seconds hold - Supine  Chin Tucks on Flat Ball  - 2-5 x daily - 7 x weekly - 1 sets - 10 reps - 5 seconds hold - Seated Isometric Cervical Retraction with Chin Tucks with Pillow behind head  - 2-5 x daily - 7 x weekly - 1 sets - 10 reps - 5 seconds hold   05/11/22 1636  PT Education  Education Details HEP at sink & sitting on bar stool to develop back strength / endurance  Person(s) Educated Patient;Spouse  Methods Explanation;Demonstration;Tactile cues;Verbal cues;Handout  Comprehension Verbalized understanding;Returned demonstration;Verbal cues required;Tactile cues required;Need further instruction   Neuromuscular Reed-  cues on proprioception RLE using residual limb, balance & standing tolerance.   Do each exercise 1-2  times per day Do each exercise 5-10 repetitions Hold each exercise for 2 seconds to feel your location  AT [place].  Try to find this position when standing still for activities.   USE TAPE ON FLOOR TO MARK THE MIDLINE POSITION which is even with middle of sink.  You also should try to feel with your limb pressure in socket.  You are trying to feel with limb what you used to feel with the bottom of your foot.  Side to Side Shift: Moving your hips only (not shoulders): move weight onto your left leg, HOLD/FEEL pressure in socket.  Move back to equal weight on each leg, HOLD/FEEL pressure in socket. Move weight onto your right leg, HOLD/FEEL pressure in socket. Move back to equal weight on each leg, HOLD/FEEL pressure in socket. Repeat.  Start with both hands on sink, progress to hand on prosthetic side only, then no  hands.  Front to Back Shift: Moving your hips only (not shoulders): move your weight forward onto your toes, HOLD/FEEL pressure in socket. Move your weight back to equal Flat Foot on both legs, HOLD/FEEL  pressure in socket. Move your weight back onto your heels, HOLD/FEEL  pressure in socket. Move your weight back  to equal on both legs, HOLD/FEEL  pressure in socket. Repeat.  Start with both hands on sink, progress to hand on prosthetic side only, then no hands.  Moving Cones / Cups: With equal weight on each leg: Hold on with one hand the first time, then progress to no hand supports. Move cups from one side of sink to the other. Place cups ~2" out of your reach, progress to 10" beyond reach.  Place one hand in middle of sink and reach with other hand. Do both arms.  Then hover one hand and move cups with other hand.  Overhead/Upward Reaching: alternated reaching up to top cabinets or ceiling if no cabinets present. Keep equal weight on each leg. Start with one hand support on counter while other hand reaches and progress to no hand support with reaching.  ace one hand in middle of sink and reach with other hand. Do both arms.  Then hover one hand and move cups with other hand.  5.   Looking Over Shoulders: With equal weight on each leg: alternate turning to look over your shoulders with one hand support on counter as needed.  Start with head motions only to look in front of shoulder, then even with shoulder and progress to looking behind you. To look to side, move head /eyes, then shoulder on side looking pulls back, shift more weight to side looking and pull hip back. Place one hand in middle of sink and let go with other hand so your shoulder can pull back. Switch hands to look other way.   Then hover one hand and look over shoulder. If looking right, use left hand at sink. If looking left, use right hand at sink. 6.  Alternate Tapping Toes or Stepping to lower shelf of cabinet  Move items under cabinet out of your way. Shift your hips/pelvis so weight on stance leg & Tighten muscles in hip.  SLOWLY step other leg so front of foot is in cabinet. Then step back to floor. Repeat with other leg.     ASSESSMENT:   CLINICAL IMPRESSION: The patient and his wife noted the wound on residual limb has improved and they  are continuing to monitor and dress it appropriately. He tolerated longer walking distances today with the RW with AFO improving RLE terminal stance motion & stabilization including 105' before needing a break, with gait mechanics benefiting from visual cueing and vitals remaining stable with activity. He continues to require UE support with static stance and was suggested to practice static stance safely at home with RW and countertop, and he will continue to benefit from improved static stance without UE support especially with new shoe and AFO support. He and his wife were educated on proper donning of AFO and were suggested to contact prosthetist about options for relief of anterior shin pressure. He continues to benefit from skilled PT.  OBJECTIVE IMPAIRMENTS .Abnormal gait, cardiopulmonary status limiting activity, decreased activity tolerance, decreased balance, decreased endurance, decreased knowledge of use of DME, decreased mobility, decreased ROM, decreased strength, dizziness, increased edema, impaired flexibility, postural dysfunction, prosthetic dependency , obesity, and pain.    ACTIVITY LIMITATIONS carrying, lifting, bending, standing,  stairs, transfers, locomotion level, UE use with ADLs, sleep    PARTICIPATION LIMITATIONS: driving, community activity, and yard work, UE use with ADLs &  household activities   Arboles, Time since onset of injury/illness/exacerbation, and 3+ comorbidities: see PMH  are also affecting patient's functional outcome.    REHAB POTENTIAL: Good   CLINICAL DECISION MAKING: Evolving/moderate complexity   EVALUATION COMPLEXITY: Moderate     GOALS: Goals reviewed with patient? Yes  SHORT TERM GOALS: Target date: 07/14/2022  1. Patient able to stand with no UE support for 1 min with supervision. Baseline: SEE OBJECTIVE DATA Goal status: ongoing 07/14/22 (19s)  2. Patient independent with donning/doffing new AFO or prosthesis. Baseline:  SEE OBJECTIVE DATA Goal status: ongoing 07/14/22 still requiring instruction for AFO donning as he received it yesterday  3. Patient demo proper sitting posture for left shoulder pain & function.  Goal status: MET 07/11/2022  4. Left shoulder pain reported </= 7/10 Goal status: MET 07/11/2022   LONG TERM GOALS: Target date: 08/04/2022   Patient demonstrates & verbalized understanding of prosthetic care to enable safe utilization of prosthesis. Baseline: SEE OBJECTIVE DATA Goal status: INITIAL   Patient tolerates right prosthesis & left AFO / partial foot prosthesis wear >90% of awake hours without skin or limb pain issues. Baseline: SEE OBJECTIVE DATA Goal status: INITIAL   Berg Balance >/= 20/56 and tasks of Berg with RW support >/= 45/56 to indicate lower fall risk with standing ADLs Baseline: SEE OBJECTIVE DATA Goal status: INITIAL   Patient ambulates >300' with prosthesis & LRAD modified independent Baseline: SEE OBJECTIVE DATA Goal status: INITIAL   Patient negotiates ramps, curbs & stairs with single rail with prosthesis & LRAD modified independent. Baseline: SEE OBJECTIVE DATA Goal status: INITIAL  6. Left shoulder pain </= 3/10 with functional activities.  Goal status: new 06/23/2022  7. Left shoulder AROM within 75% of Right shoulder  Goal status: new 06/23/2022  8.patient able to use LUE for ADLs, gait, transfers, etc. Safely  Goal status: new 06/23/2022     PLAN: PT FREQUENCY: 2x/week   PT DURATION: 13 weeks (90 days);   shoulder included on 06/23/2022 for 6 weeks.    PLANNED INTERVENTIONS: Therapeutic exercises, Therapeutic activity, Neuromuscular re-education, Balance training, Gait training, Patient/Family education, Self Care, Joint mobilization, Stair training, Vestibular training, Canalith repositioning, Prosthetic training, DME instructions, Dry Needling, Electrical stimulation, Cryotherapy, Moist heat, Taping, Vasopneumatic device, Ultrasound, Manual therapy,  Re-evaluation, and physical performance testing    PLAN FOR NEXT SESSION:  continue to monitor HR & SpO2 with activities, progress gait with RW with increasing step length for heel contact, instruct in negotiating ramps & curbs,  static balance tasks  Jana Hakim, Student-PT 07/13/2022, 12:44 PM  This entire session of physical therapy was performed under the direct supervision of PT signing evaluation /treatment. PT reviewed note and agrees.  Jamey Reas, PT, DPT 07/13/2022, 1:12 PM

## 2022-07-15 NOTE — Therapy (Signed)
OUTPATIENT PHYSICAL THERAPY TREATMENT NOTE   Patient Name: Shane [name] MRN: [id number] DOB:1940/12/29, 81 y.o., male Today's Date: 07/18/2022  PCP: Burnard Bunting, MD REFERRING PROVIDER: [name], MD for TTA & [name], NP for left shoulder   END OF SESSION:   PT End of Session - 07/18/22 0802     Visit Number 19    Number of Visits 26    Date for PT Re-Evaluation 08/04/22    Authorization Type UHC Medicare    Authorization Time Period [facility] MEDICARE  $20 COPAY    Progress Note Due on Visit 20    PT Start Time 0800    PT Stop Time 0843    PT Time Calculation (min) 43 min    Activity Tolerance Patient tolerated treatment well;Patient limited by fatigue    Behavior During Therapy WFL for tasks assessed/performed              Past Medical History:  Diagnosis Date   Anxiety    AV block, Mobitz 1    Cataract    Chronic kidney disease    d/t DM   CKD (chronic kidney disease), stage III ([place])    Depression    Diabetes mellitus    Vgo disposal insulin bolus  simular to insulin pump   Dyspnea    GERD (gastroesophageal reflux disease)    History of kidney stones    passed   Hyperlipidemia    Hypertension    Idiopathic pulmonary fibrosis ([place]) 11/2016   ILD (interstitial lung disease) ([place])    Moderate aortic stenosis    a. 10/2019 Echo: EF 55-60%, Gr2 DD. Nl RV.    Neuromuscular disorder ([place])    Neuropathy associated with endocrine disorder ([place])    Nonobstructive CAD (coronary artery disease)    a. 2012 Cath: mod, nonobs dzs; b. 10/2016 MV: EF 60%, no ischemia.   OSA on CPAP 05/05/2017   Unattended Home Sleep Test 7/2/813-AHI 38.6/hour, desaturation to 64%, body weight 261 pounds   PONV (postoperative nausea and vomiting)    Sleep apnea     uses cpap asked to bring mask and tubing   Past Surgical History:  Procedure Laterality Date   ABDOMINAL AORTOGRAM W/LOWER EXTREMITY N/A 12/10/2020   Procedure: ABDOMINAL AORTOGRAM W/LOWER EXTREMITY;   Surgeon: Cherre Robins, MD;  Location: [place] [facility];  Service: Cardiovascular;  Laterality: N/A;   AMPUTATION Right 01/22/2021   Procedure: 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;  Surgeon: [name], MD;  Location: [place];  Service: Orthopedics;  Laterality: Right;   AMPUTATION Right 03/17/2021   Procedure: RIGHT BELOW KNEE AMPUTATION;  Surgeon: [name], MD;  Location: [place];  Service: Orthopedics;  Laterality: Right;   ANKLE FUSION Right 01/22/2021   Procedure: RIGHT FOOT TIBIOCALCANEAL FUSION;  Surgeon: [name], MD;  Location: [place];  Service: Orthopedics;  Laterality: Right;   ANTERIOR FUSION CERVICAL SPINE  2012   CARDIAC CATHETERIZATION  2011   CARDIAC CATHETERIZATION N/A 11/09/2016   Procedure: Right Heart Cath;  Surgeon: Belva Crome, MD;  Location: [place] [facility];  Service: Cardiovascular;  Laterality: N/A;   carpel tunnel     left wrist   CATARACT EXTRACTION     CATARACT EXTRACTION W/ INTRAOCULAR LENS  IMPLANT, BILATERAL  2013   CERVICAL LAMINECTOMY  2012   COLONOSCOPY N/A 01/14/2013   Procedure: COLONOSCOPY;  Surgeon: Irene Shipper, MD;  [facility]: [facility];  Service: Endoscopy;  Laterality: N/A;  CORONARY ARTERY BYPASS GRAFT  11/04/2020   LIMA-LAD, SVG-OM1, SVG-PDA (Dr Mar[facility]) dc 11/18/2020   EYE SURGERY     I & D EXTREMITY Right 02/19/2021   Procedure: RIGHT ANKLE DEBRIDEMENT AND PLACEMENT ANTIBIOTIC BEADS;  Surgeon: [name], MD;  Location: [place];  Service: Orthopedics;  Laterality: Right;   KNEE SURGERY  1998   left   LEFT HEART CATH AND CORONARY ANGIOGRAPHY N/A 07/10/2020   Procedure: LEFT HEART CATH AND CORONARY ANGIOGRAPHY;  Surgeon: Sherren Mocha, MD;  Location: [place] [facility];  Service: Cardiovascular;  Laterality: N/A;   LUMBAR LAMINECTOMY  2003   LUNG BIOPSY Left 12/26/2016   Procedure: LUNG BIOPSY;  Surgeon: [name], MD;  Location: [place];  Service: Thoracic;  Laterality: Left;   PACEMAKER IMPLANT N/A 03/30/2020    Procedure: PACEMAKER IMPLANT;  Surgeon: Evans Lance, MD;  Location: [place] [facility];  Service: Cardiovascular;  Laterality: N/A;   PERIPHERAL VASCULAR INTERVENTION Right 12/10/2020   Procedure: PERIPHERAL VASCULAR INTERVENTION;  Surgeon: Cherre Robins, MD;  Location: [place] [facility];  Service: Cardiovascular;  Laterality: Right;  SFA   POSTERIOR FUSION CERVICAL SPINE  2012   PPM GENERATOR REMOVAL N/A 12/14/2020   Procedure: PPM GENERATOR REMOVAL;  Surgeon: Evans Lance, MD;  Location: [place] [facility];  Service: Cardiovascular;  Laterality: N/A;   TEE WITHOUT CARDIOVERSION N/A 12/11/2020   Procedure: TRANSESOPHAGEAL ECHOCARDIOGRAM (TEE);  Surgeon: Geralynn Rile, MD;  Location: [place];  Service: Cardiovascular;  Laterality: N/A;   TRIGGER FINGER RELEASE  2011   4th finger left hand   VIDEO ASSISTED THORACOSCOPY Left 12/26/2016   Procedure: VIDEO ASSISTED THORACOSCOPY;  Surgeon: [name], MD;  Location: [place];  Service: Thoracic;  Laterality: Left;   VIDEO BRONCHOSCOPY N/A 12/26/2016   Procedure: VIDEO BRONCHOSCOPY;  Surgeon: [name], MD;  Location: [place];  Service: Thoracic;  Laterality: N/A;   Patient Active Problem List   Diagnosis Date Noted   Intermediate stage nonexudative age-related macular degeneration of both eyes 04/25/2022   Research study patient 12/24/2021   Post-operative pain    CKD (chronic kidney disease), stage II    Labile blood glucose    Pulmonary fibrosis ([place])    Hyponatremia    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[place]) 03/26/2021   S/P BKA (below knee amputation) unilateral, right ([place])    Shortness of breath    Acute blood loss anemia    Stage 3 chronic kidney disease ([place])    Atrial fibrillation ([place])    Controlled type 2 diabetes mellitus with hyperglycemia, with long-term current use of insulin (H[facility])    Postoperative pain    Postoperative hemorrhagic shock 03/20/2021   Atherosclerosis of native arteries of extremities  with gangrene, right leg ([place])    Hardware complicating wound infection ([place])    Abscess of bursa of right ankle 02/15/2021   Endocarditis 01/19/2021   Peripheral vascular disease ([place]) 12/29/2020   CHF (congestive heart failure), NYHA class II, chronic, diastolic ([place]) 58/85/0277   History of COVID-19 12/22/2020   SSS (sick sinus syndrome) ([place]) 12/22/2020   Acute bacterial endocarditis    MSSA bacteremia 12/09/2020   Diabetic foot ulcer ([place]) 12/09/2020   Foot drop, right foot    Generalized weakness 12/08/2020   Dehydration with hyponatremia 12/08/2020   Atrial fibrillation, chronic (HCC) 12/08/2020   Elevated troponin level not due myocardial infarction 12/08/2020   Adrenal insufficiency ([place]) 11/14/2020   Orthostatic  hypotension 11/14/2020   Physical deconditioning 11/14/2020   Difficulty sleeping 11/07/2020   Postoperative anemia due to acute blood loss 11/07/2020   Right ventricular dysfunction 11/05/2020   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/04/2020   Hearing loss 11/03/2020   Cardiac device in situ 09/09/2020   Coronary atherosclerosis due to lipid rich plaque 09/02/2020   COVID-19 virus infection 07/18/2020   Coronary artery disease involving native heart without angina pectoris 07/10/2020   Chronic kidney disease, stage 3a ([place]) 03/29/2020   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/28/2020   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 ([place]) 03/16/2020   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 ([place]) 03/16/2020   Right epiretinal membrane 03/16/2020   Posterior vitreous detachment of right eye 03/16/2020   Aortic stenosis, moderate 03/12/2020   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   Near syncope 04/27/2018   Hypoglycemia due to insulin 01/06/2018   Essential hypertension 01/06/2018   Diabetes mellitus type 2, with complication, on long term insulin pump ([place]) 01/06/2018   Syncope and  collapse 01/05/2018   Encounter for therapeutic drug monitoring 06/29/2017   OSA on CPAP 05/05/2017   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[place]) 01/05/2017   Abnormal chest x-ray 10/12/2016   Benign neoplasm of colon 01/14/2013    REFERRING DIAG: Z89.511 (ICD-10-CM) - Hx of right BKA  ONSET DATE: 05/04/2022 MD visit with release to [facility] on LLE  THERAPY DIAG:  Unsteadiness on feet  Other abnormalities of gait and mobility  Muscle weakness (generalized)  Abnormal posture  Acute pain of left shoulder  Stiffness of left shoulder, not elsewhere classified  Localized edema  Rationale for Evaluation and Treatment Rehabilitation  PERTINENT HISTORY: CKD st III, AV block Mobitz, pacemaker, pulmonary fibrosis, CAD, CABG 21, HTN, A-Fib, DM2, PAD, right BKA, Left TMA  PRECAUTIONS: ICD/Pacemaker - continue monitoring vitals with activity and calculated HRmax is 151 bpm  SUBJECTIVE: He saw Gerald Stabs and had the anterior pad adjusted for comfort at the shin, and the plastic piece at the top of the foot was taken out. He is concerned with the size and bulk of the AFO and pulled his groin during a car transfer on Friday or Saturday which has recovered. He struggles with getting AFO on leg without wife assistance. He is continuing to elevate his RLE daily and wears the AFO through the whole day until he does elevate it. The spot on the residual limb is gone and they have no skin concerns elsewhere. He has been walking at home and standing more, practicing standing 2-3x/day without UE assist.  PAIN:    Are you having pain? Yes: NPRS scale: today 0/10, over last week lowest 0/10  Pain location: left shoulder lateral just distal to joint Pain description: tingling when shoulder in improper posture. Aggravating factors: improper posture. Relieving factors: correcting posture   OBJECTIVE: (objective measures completed at initial evaluation unless otherwise dated)  COGNITION: Overall cognitive status:  Within functional limits for tasks assessed   POSTURE: 05/09/2022:  rounded shoulders, forward head, and flexed trunk    LOWER EXTREMITY ROM:    UPPER EXTREMITY ROM:  ROM P:passive  A:active Left 06/23/22 Supine all limited by pain  Shoulder flexion 30*  Shoulder extension 0* neutral  Shoulder abduction 45*  Shoulder adduction   Shoulder internal rotation 20*  Shoulder external rotation 10*  Elbow flexion   Elbow extension -15*  Wrist flexion   Wrist extension  Wrist ulnar deviation   Wrist radial deviation   Wrist pronation   Wrist supination    (Blank rows = not tested)    ROM P:passive  A:active Right Eval 05/09/2022 Left Eval 05/09/2022  Hip flexion      Hip extension A: -25* standing A: -25* standing  Hip abduction      Hip adduction      Hip internal rotation      Hip external rotation      Knee flexion      Knee extension      Ankle dorsiflexion NA P: -17*  Ankle plantarflexion NA    Ankle inversion NA    Ankle eversion NA     (Blank rows = not tested)   LOWER EXTREMITY MMT:   MMT Right Eval 05/09/2022 Left Eval 05/09/2022  Hip flexion      Hip extension Functional testing 3-/5 Functional testing 3-/5  Hip abduction Functional testing 3-/5 Functional testing 3-/5  Hip adduction      Hip internal rotation      Hip external rotation      Knee flexion Functional testing 3-/5 Functional testing 3-/5  Knee extension Functional testing 3/5 Functional testing 3/5  Ankle dorsiflexion      Ankle plantarflexion      Ankle inversion      Ankle eversion      (Blank rows = not tested)   POSTURE:  06/23/2022  seated - head forward, rounded shoulders and posterior pelvic tilt  TRANSFERS: 05/09/2022:  Sit to stand: SBA and requires use of armrests from 20" stable w/c & needs RW for stabilization 05/09/2022:  Stand to sit: SBA and requires use of armrests from 20" stable w/c & needs RW for stabilization   GAIT: 05/09/2022:  Gait pattern: step to pattern, decreased  step length- Left, decreased stance time- Right, Right hip hike, antalgic, lateral hip instability, trunk flexed, and wide BOS Distance walked: 25' Assistive device utilized: Environmental consultant - 2 wheeled and TTA prosthesis RLE Level of assistance: Min A Comments: pt reports dizziness with turning in gait   UE FUNCTION: 06/23/2022:  Pt reports that he uses RUE for fine motor like writing but uses both hands for gross motor or sports.  Seated - pt unable to raise LUE in flexion or abd due to pain.  Supine - pt unable to move LUE in flexion or abd due to pain.  PROM flexion > pain than abd   Pt tolerates elbow ext with palm supinated but with palm neutral causes shoulder pain. Also painful with elbow already extended and pronate palm.   PT simulated pushing on RW thru LUE with manual resistance.  Pt reports slight increase in pain but not as sharp as above function.    FUNCTIONAL TESTs:  Merrilee Jansky Balance Scale: 05/09/2022:  9/56 & tasks of Berg with RW support 27/56    [facility] PT Assessment - 05/09/22 0930                Berg Balance Test    Sit to Stand Needs moderate or maximal assist to stand   task with RW support = 3    Standing Unsupported Unable to stand 30 seconds unassisted   task with RW support = 3    Sitting with Back Unsupported but Feet Supported on Floor or Stool Able to sit safely and securely 2 minutes     Stand to Sit Uses backs of legs against chair to control descent   task  with RW support = 3    Transfers Able to transfer safely, definite need of hands     Standing Unsupported with Eyes Closed Needs help to keep from falling   task with RW support = 3    Standing Unsupported with Feet Together Needs help to attain position and unable to hold for 15 seconds   task with RW support = 2    From Standing, Reach Forward with Outstretched Arm Loses balance while trying/requires external support   task with RW support = 1    From Standing Position, Pick up Object from Floor Unable to try/needs  assist to keep balance   task with RW support = 2    From Standing Position, Turn to Look Behind Over each Shoulder Needs assist to keep from losing balance and falling   task with RW support = 1    Turn 360 Degrees Needs assistance while turning   task with RW support = 0    Standing Unsupported, Alternately Place Feet on Step/Stool Needs assistance to keep from falling or unable to try   task with RW support = 0    Standing Unsupported, One Foot in [employer] balance while stepping or standing   task with RW support = 1    Standing on One Leg Unable to try or needs assist to prevent fall   task with RW support = 1    Total Score 9     Berg comment: tasks of Berg with RW support 27/56                     CURRENT PROSTHETIC WEAR ASSESSMENT: 05/09/2022:   Patient is dependent with: skin check, residual limb care, care of non-amputated limb, prosthetic cleaning, ply sock cleaning, and correct ply sock adjustment Donning prosthesis: Modified independence Doffing prosthesis: Modified independence Prosthetic wear tolerance: >90% of awake hours/day, 7 days/week Prosthetic weight bearing tolerance: 5 minutes Edema: LLE pitting edema with >10 sec capillary refill Residual limb condition: Right BKA limb has no openings, normal temperature & moisture.  Redness patella & patella tendon from friction of liner with knee flexed. Cylinderical shape.  LLE TMA pt's wife showed picture of wound on dorsum of foot that is being managed by PCP.   Prosthetic description: silicon liner with pin lock suspension, total contact socket, dynamic response foot.      TODAY'S TREATMENT:  07/18/2022 Prosthetic Training with right TTA prosthesis & left dynamic AFO and custom shoe with toe filler and carbon plate: PT education on purpose and structure of AFO, prosthetic care and planning in case of second BKA and risk for additional amputation, donning prosthesis and AFO. Pt verbalized understanding and donned AFO x 2   PT assessed for prosthesis height difference, determined need for 3/8" length increase and referred pt to prosthetist for modification today, pt and wife verbalized understanding Pt ambulate 31' with RW and SBA with visual target for foot placement  Neuromuscular re-ed: working on upright balance with shoes in //bars Static stance x 30 sec, multiple instances of UE touch for support and visual cueing for posture Modified tandem stance x 30 sec bil with full step forward, multiple instances of UE touch for support visual cueing for posture Alternating hip abduction x 15 bil. with heavy UE support on bars, verbal cueing for upright posture Alternating hip extension x 5 bil with heavy UE support and x 5 bil with fingertip UE support, verbal cueing for upright posture Vitals following  standing balance tasks: SpO2 99% HR 66 bpm  07/13/2022 Prosthetic Training with right TTA prosthesis & left dynamic AFO and custom shoe with toe filler and carbon plate Resting vitals: SpO2 97% HR 61 bpm PT assessed fit of AFO and educated about proper donning and fit of AFO and shoe with visual and verbal demo. PT suggested notifying prosthetist of anterior medial proximal tibia soreness with possibility of padding or relief on anterior plate in that area. Pt and wife verbalized understanding PT suggested bringing in walker and rollator for assessment and training of use, pt and wife verbalized willingness to bring them in Static standing 2 min supervision trying to lift hands from RW, required fingertip BUE support on RW to maintain balance and longest time was 19 sec without UE contact PT educated on safe set up of counter behind him to practice standing without UE support at home, pt verbalized understanding and stood 2 min with fingertip UE support on RW, trying to lift hands from walker Sit to/from stand from 18" chair without armrests x 2, required visual demo and verbal explanation of form and proper UE use for  safety Standing hip flexor, chest, anterior shoulder stretch at counter for upright posturing x 3 min with cervical retraction x 5 reps Pt walked 25' x 2 with RW and SBA, observed shorter stance time on Lt foot with foot flat contact on Rt, LLE adduction Pt walked 105' with RW and SBA with visual target for foot placement, improved Lt foot placement and Rt foot flat contact Vitals following 105' walk: SpO2 99% HR 107 bpm  Self Care: PT advised wrapping wound with gauze without bandage wrap directly touching skin to prevent wounds from removal of bandage, pt and wife verbalized understanding  07/11/2022 Prosthetic Training with right TTA prosthesis & left shoe with toe filler & carbon plate 62m dry blood wound on right medial tibial plateau with no signs of infection.  He arrived with bandaid covering wound & PT explained pressure issue inside the socket.  PT demo & verbal cues to pt & wife on applying & use of Tegaderm when wearing prosthesis and Vivewear shrinker in direct contact with prosthesis off.  Clean prior & after Tegaderm with soap & water.  Pt & wife verbalized understanding.  Pt amb 65' X 2 with RW with contact guard assist. PT demo & verbal cues for pt & wife on technique for how to safely provide contact assist and if need lower him to floor to prevent injury. Wife walked 20' counting steps to set up that distance for gait at home between w/c & chair with armrests. PT recommended frequency of 4 times during day initially with plan to progress frequency & distance over time. Wife assisted contact guard with PT supervising 20' X 2 with RW. Both verbalized understanding and feel comfortable trying at home.  HR 72 SpO2 97%  Neuromuscular Re-education for balance & upright posture: Standing 5 min total with RW support:  static 1 min ea UE with single UE support with supervision;   Reaching forward with ea UE 5 reps ~5" ea. No pain with LUE reach or support. Contact guard assist.  Trunk  rotation to look just behind shoulder with single UE support 5 reps ea direction with contact guard assist.  HR 77 SpO2 96%  HOME EXERCISE PROGRAM   07/05/22 0935  PT Education  Education Details Access Code: [email or web address] & reviewed  Person(s) Educated Patient;Spouse  Methods Explanation;Demonstration;Tactile cues;Verbal cues;Handout  Comprehension Verbalized  understanding;Returned demonstration;Need further instruction   Access Code: [email or web address] URL: [email or web address] Date: 07/05/2022 Prepared by: Jamey Reas  Exercises - Seated Correct Posture  - 1 x daily - 7 x weekly - Seated Scapular Retraction  - 2-5 x daily - 7 x weekly - 1 sets - 10 reps - 5 seconds hold - Supine Chin Tucks on Flat Ball  - 2-5 x daily - 7 x weekly - 1 sets - 10 reps - 5 seconds hold - Seated Isometric Cervical Retraction with Chin Tucks with Pillow behind head  - 2-5 x daily - 7 x weekly - 1 sets - 10 reps - 5 seconds hold - Circular Shoulder Pendulum with Table Support  - 1-2 x daily - 7 x weekly - 1-2 sets - 10 reps - 5 seconds hold - Flexion-Extension Shoulder Pendulum with Table Support  - 1-2 x daily - 7 x weekly - 1-2 sets - 10 reps - Horizontal Shoulder Pendulum with Table Support  - 1-2 x daily - 7 x weekly - 1-2 sets - 10 reps - Supine Shoulder Flexion Extension AAROM with Dowel  - 1 x daily - 7 x weekly - 1-2 sets - 10 reps - 5 seconds hold - Supine Shoulder Abduction AAROM with Dowel  - 1 x daily - 7 x weekly - 1-2 sets - 10 reps - 5 seconds hold - Supine Shoulder External Rotation with Dowel  - 1 x daily - 7 x weekly - 1-2 sets - 10 reps - 5 seconds hold    06/23/22 1413  PT Education  Education Details Access Code: [email or web address]  Person(s) Educated Patient;Spouse  Methods Explanation;Handout;Demonstration;Tactile cues;Verbal cues  Comprehension Verbalized understanding;Returned demonstration;Verbal cues required;Tactile cues required;Need further instruction   Access Code:  [email or web address] URL: [email or web address] Date: 06/23/2022 Prepared by: Jamey Reas  Exercises - Seated Correct Posture  - 1 x daily - 7 x weekly - Seated Scapular Retraction  - 2-5 x daily - 7 x weekly - 1 sets - 10 reps - 5 seconds hold - Supine Chin Tucks on Flat Ball  - 2-5 x daily - 7 x weekly - 1 sets - 10 reps - 5 seconds hold - Seated Isometric Cervical Retraction with Chin Tucks with Pillow behind head  - 2-5 x daily - 7 x weekly - 1 sets - 10 reps - 5 seconds hold   05/11/22 1636  PT Education  Education Details HEP at sink & sitting on bar stool to develop back strength / endurance  Person(s) Educated Patient;Spouse  Methods Explanation;Demonstration;Tactile cues;Verbal cues;Handout  Comprehension Verbalized understanding;Returned demonstration;Verbal cues required;Tactile cues required;Need further instruction   Neuromuscular Reed-  cues on proprioception RLE using residual limb, balance & standing tolerance.   Do each exercise 1-2  times per day Do each exercise 5-10 repetitions Hold each exercise for 2 seconds to feel your location  AT [employer].  Try to find this position when standing still for activities.   USE TAPE ON FLOOR TO MARK THE MIDLINE POSITION which is even with middle of sink.  You also should try to feel with your limb pressure in socket.  You are trying to feel with limb what you used to feel with the bottom of your foot.  Side to Side Shift: Moving your hips only (not shoulders): move weight onto your left leg, HOLD/FEEL pressure in socket.  Move back to equal weight on each leg, HOLD/FEEL pressure in socket. Move  weight onto your right leg, HOLD/FEEL pressure in socket. Move back to equal weight on each leg, HOLD/FEEL pressure in socket. Repeat.  Start with both hands on sink, progress to hand on prosthetic side only, then no hands.  Front to Back Shift: Moving your hips only  (not shoulders): move your weight forward onto your toes, HOLD/FEEL pressure in socket. Move your weight back to equal Flat Foot on both legs, HOLD/FEEL  pressure in socket. Move your weight back onto your heels, HOLD/FEEL  pressure in socket. Move your weight back to equal on both legs, HOLD/FEEL  pressure in socket. Repeat.  Start with both hands on sink, progress to hand on prosthetic side only, then no hands.  Moving Cones / Cups: With equal weight on each leg: Hold on with one hand the first time, then progress to no hand supports. Move cups from one side of sink to the other. Place cups ~2" out of your reach, progress to 10" beyond reach.  Place one hand in middle of sink and reach with other hand. Do both arms.  Then hover one hand and move cups with other hand.  Overhead/Upward Reaching: alternated reaching up to top cabinets or ceiling if no cabinets present. Keep equal weight on each leg. Start with one hand support on counter while other hand reaches and progress to no hand support with reaching.  ace one hand in middle of sink and reach with other hand. Do both arms.  Then hover one hand and move cups with other hand.  5.   Looking Over Shoulders: With equal weight on each leg: alternate turning to look over your shoulders with one hand support on counter as needed.  Start with head motions only to look in front of shoulder, then even with shoulder and progress to looking behind you. To look to side, move head /eyes, then shoulder on side looking pulls back, shift more weight to side looking and pull hip back. Place one hand in middle of sink and let go with other hand so your shoulder can pull back. Switch hands to look other way.   Then hover one hand and look over shoulder. If looking right, use left hand at sink. If looking left, use right hand at sink. 6.  Alternate Tapping Toes or Stepping to lower shelf of cabinet  Move items under cabinet out of your way. Shift your hips/pelvis so weight on  stance leg & Tighten muscles in hip.  SLOWLY step other leg so front of foot is in cabinet. Then step back to floor. Repeat with other leg.     ASSESSMENT:   CLINICAL IMPRESSION: He was referred to the prosthetist to increase length by 3/8" after standing assessment. He is continuing to progress with his standing and walking tolerance, with vitals remaining stable with activity. He requires UE support with static balance tasks and has begun working on standing balance at home; he will benefit from further balance intervention. He required additional instruction and assessment of donning AFO and will try using a dressing stick next session to assist. He continues to benefit from skilled PT to address functional impairments.  OBJECTIVE IMPAIRMENTS .Abnormal gait, cardiopulmonary status limiting activity, decreased activity tolerance, decreased balance, decreased endurance, decreased knowledge of use of DME, decreased mobility, decreased ROM, decreased strength, dizziness, increased edema, impaired flexibility, postural dysfunction, prosthetic dependency , obesity, and pain.    ACTIVITY LIMITATIONS carrying, lifting, bending, standing, stairs, transfers, locomotion level, UE use with ADLs, sleep  PARTICIPATION LIMITATIONS: driving, community activity, and yard work, UE use with ADLs &  household activities   [place], Time since onset of injury/illness/exacerbation, and 3+ comorbidities: see PMH  are also affecting patient's functional outcome.    REHAB POTENTIAL: Good   CLINICAL DECISION MAKING: Evolving/moderate complexity   EVALUATION COMPLEXITY: Moderate     GOALS: Goals reviewed with patient? Yes  SHORT TERM GOALS: Target date: 07/14/2022  1. Patient able to stand with no UE support for 1 min with supervision. Baseline: SEE OBJECTIVE DATA Goal status: ongoing 07/18/22   2. Patient independent with donning/doffing new AFO or prosthesis. Baseline: SEE OBJECTIVE  DATA Goal status: ongoing 07/18/22 still requiring assistance for AFO donning  3. Patient demo proper sitting posture for left shoulder pain & function.  Goal status: MET 07/11/2022  4. Left shoulder pain reported </= 7/10 Goal status: MET 07/11/2022   LONG TERM GOALS: Target date: 08/04/2022   Patient demonstrates & verbalized understanding of prosthetic care to enable safe utilization of prosthesis. Baseline: SEE OBJECTIVE DATA Goal status: INITIAL   Patient tolerates right prosthesis & left AFO / partial foot prosthesis wear >90% of awake hours without skin or limb pain issues. Baseline: SEE OBJECTIVE DATA Goal status: INITIAL   Berg Balance >/= 20/56 and tasks of Berg with RW support >/= 45/56 to indicate lower fall risk with standing ADLs Baseline: SEE OBJECTIVE DATA Goal status: INITIAL   Patient ambulates >300' with prosthesis & LRAD modified independent Baseline: SEE OBJECTIVE DATA Goal status: INITIAL   Patient negotiates ramps, curbs & stairs with single rail with prosthesis & LRAD modified independent. Baseline: SEE OBJECTIVE DATA Goal status: INITIAL  6. Left shoulder pain </= 3/10 with functional activities.  Goal status: new 06/23/2022  7. Left shoulder AROM within 75% of Right shoulder  Goal status: new 06/23/2022  8.patient able to use LUE for ADLs, gait, transfers, etc. Safely  Goal status: new 06/23/2022     PLAN: PT FREQUENCY: 2x/week   PT DURATION: 13 weeks (90 days);   shoulder included on 06/23/2022 for 6 weeks.    PLANNED INTERVENTIONS: Therapeutic exercises, Therapeutic activity, Neuromuscular re-education, Balance training, Gait training, Patient/Family education, Self Care, Joint mobilization, Stair training, Vestibular training, Canalith repositioning, Prosthetic training, DME instructions, Dry Needling, Electrical stimulation, Cryotherapy, Moist heat, Taping, Vasopneumatic device, Ultrasound, Manual therapy, Re-evaluation, and physical performance  testing    PLAN FOR NEXT SESSION:  progress note next visit, continue to monitor HR & SpO2 with activities, AFO donning, progress gait with RW with increasing step length for heel contact, instruct in negotiating ramps & curbs  Jana Hakim, Student-PT 07/18/2022, 12:25 PM  This entire session of physical therapy was performed under the direct supervision of PT signing evaluation /treatment. PT reviewed note and agrees.  Jamey Reas, PT, DPT 07/18/2022, 12:45. PM

## 2022-07-18 ENCOUNTER — Encounter: Payer: Self-pay | Admitting: Physical Therapy

## 2022-07-18 ENCOUNTER — Ambulatory Visit (INDEPENDENT_AMBULATORY_CARE_PROVIDER_SITE_OTHER): Payer: Medicare Other | Admitting: Physical Therapy

## 2022-07-18 DIAGNOSIS — M6281 Muscle weakness (generalized): Secondary | ICD-10-CM | POA: Diagnosis not present

## 2022-07-18 DIAGNOSIS — R600 Localized edema: Secondary | ICD-10-CM

## 2022-07-18 DIAGNOSIS — M25512 Pain in left shoulder: Secondary | ICD-10-CM

## 2022-07-18 DIAGNOSIS — R2689 Other abnormalities of gait and mobility: Secondary | ICD-10-CM | POA: Diagnosis not present

## 2022-07-18 DIAGNOSIS — R293 Abnormal posture: Secondary | ICD-10-CM | POA: Diagnosis not present

## 2022-07-18 DIAGNOSIS — R2681 Unsteadiness on feet: Secondary | ICD-10-CM | POA: Diagnosis not present

## 2022-07-18 DIAGNOSIS — M25612 Stiffness of left shoulder, not elsewhere classified: Secondary | ICD-10-CM

## 2022-07-18 NOTE — Telephone Encounter (Signed)
Seems like encounter was open in error so closing encounter.  

## 2022-07-20 ENCOUNTER — Encounter: Payer: Medicare Other | Admitting: Physical Therapy

## 2022-07-22 ENCOUNTER — Encounter: Payer: Medicare Other | Admitting: *Deleted

## 2022-07-22 ENCOUNTER — Ambulatory Visit: Payer: Medicare Other

## 2022-07-22 DIAGNOSIS — Z006 Encounter for examination for normal comparison and control in clinical research program: Secondary | ICD-10-CM

## 2022-07-22 DIAGNOSIS — J84112 Idiopathic pulmonary fibrosis: Secondary | ICD-10-CM

## 2022-07-22 NOTE — Research (Signed)
Title: [id number] Open-Label Extension (OLE) is a multi-center, single-arm, open-label extension (OLE) phase where subjects who complete the Week 48 visit of the main study are eligible to participate. The extension to the main study allows for the continued evaluation of the efficacy and safety of 30 mg/kg IV infusions of pamrevlumab administered every 3 weeks for 52 weeks in subjects with Idiopathic Pulmonary Fibrosis.   Primary endpoint is: To provide continued access to pamrevlumab in subjects with IPF who completed the Week 48 visit of the main study.  Protocol #: [id number], Clinical Trials #: [id number] Sponsor: [email or web address]  ([place], [place], [place])  [facility] [employer] Coordinator note :   This visit for Subject Jacaden Forbush Ragon with DOB: 02/02/1941 on 07/22/2022 for the above protocol is Visit/Encounter # EX 60-day safety follow up and is for purpose of research.   The consent for this encounter is under Protocol: Protocol Amendment 6.0 (Approved date: 31OCT2022) IB: Version 21 ([id number])  Main ICF: version [id number], revised 16Dec22 Optional Genetic Consent: version 16Dec2022 OLE ICF: version [phone]  Subject expressed continued interest and consent in continuing as a study subject. Subject confirmed that there was no change in contact information (e.g. address, telephone, email). Subject thanked for participation in research and contribution to science.   The Subject was informed that the PI Dr. Chase Caller continues to have oversight of the subject's visits and course through relevant discussions, reviews and also specifically of this visit by routing of this note to the [place].  Today's visit was conducted remotely via telephone as per protocol. The patient reported no new complaints since his last visit. Please refer to paper source subject binder for further details.     Signed by  Lytle Michaels  Clinical Research Coordinator [facility]   [place], [place] 2:46 PM 07/22/2022

## 2022-07-25 ENCOUNTER — Ambulatory Visit (INDEPENDENT_AMBULATORY_CARE_PROVIDER_SITE_OTHER): Payer: Medicare Other | Admitting: Physical Therapy

## 2022-07-25 ENCOUNTER — Encounter: Payer: Self-pay | Admitting: Physical Therapy

## 2022-07-25 ENCOUNTER — Other Ambulatory Visit: Payer: Self-pay | Admitting: Cardiology

## 2022-07-25 DIAGNOSIS — M25512 Pain in left shoulder: Secondary | ICD-10-CM

## 2022-07-25 DIAGNOSIS — R293 Abnormal posture: Secondary | ICD-10-CM | POA: Diagnosis not present

## 2022-07-25 DIAGNOSIS — R600 Localized edema: Secondary | ICD-10-CM

## 2022-07-25 DIAGNOSIS — M6281 Muscle weakness (generalized): Secondary | ICD-10-CM | POA: Diagnosis not present

## 2022-07-25 DIAGNOSIS — E785 Hyperlipidemia, unspecified: Secondary | ICD-10-CM

## 2022-07-25 DIAGNOSIS — M25612 Stiffness of left shoulder, not elsewhere classified: Secondary | ICD-10-CM

## 2022-07-25 DIAGNOSIS — R2681 Unsteadiness on feet: Secondary | ICD-10-CM

## 2022-07-25 DIAGNOSIS — R2689 Other abnormalities of gait and mobility: Secondary | ICD-10-CM | POA: Diagnosis not present

## 2022-07-25 NOTE — Therapy (Signed)
OUTPATIENT PHYSICAL THERAPY TREATMENT NOTE & PROGRESS NOTE   Patient Name: Shane [name] MRN: [id number] DOB:1941/03/18, 81 y.o., male Today's Date: 07/25/2022  PCP: Burnard Bunting, MD REFERRING PROVIDER: [name], MD for TTA & [name], NP for left shoulder  Progress Note Reporting Period 06/20/22 to 07/25/22  See note below for Objective Data and Assessment of Progress/Goals.    END OF SESSION:   PT End of Session - 07/25/22 0803     Visit Number 20    Number of Visits 26    Date for PT Re-Evaluation 08/04/22    Authorization Type UHC Medicare    Authorization Time Period [facility] MEDICARE  $20 COPAY    Progress Note Due on Visit 30    PT Start Time 0801    PT Stop Time 0844    PT Time Calculation (min) 43 min    Activity Tolerance Patient tolerated treatment well;Patient limited by fatigue    Behavior During Therapy WFL for tasks assessed/performed             Past Medical History:  Diagnosis Date   Anxiety    AV block, Mobitz 1    Cataract    Chronic kidney disease    d/t DM   CKD (chronic kidney disease), stage III ([place])    Depression    Diabetes mellitus    Vgo disposal insulin bolus  simular to insulin pump   Dyspnea    GERD (gastroesophageal reflux disease)    History of kidney stones    passed   Hyperlipidemia    Hypertension    Idiopathic pulmonary fibrosis ([place]) 11/2016   ILD (interstitial lung disease) ([place])    Moderate aortic stenosis    a. 10/2019 Echo: EF 55-60%, Gr2 DD. Nl RV.    Neuromuscular disorder ([place])    Neuropathy associated with endocrine disorder ([place])    Nonobstructive CAD (coronary artery disease)    a. 2012 Cath: mod, nonobs dzs; b. 10/2016 MV: EF 60%, no ischemia.   OSA on CPAP 05/05/2017   Unattended Home Sleep Test 7/2/813-AHI 38.6/hour, desaturation to 64%, body weight 261 pounds   PONV (postoperative nausea and vomiting)    Sleep apnea     uses cpap asked to bring mask and tubing   Past Surgical History:   Procedure Laterality Date   ABDOMINAL AORTOGRAM W/LOWER EXTREMITY N/A 12/10/2020   Procedure: ABDOMINAL AORTOGRAM W/LOWER EXTREMITY;  Surgeon: Cherre Robins, MD;  Location: [place] [facility];  Service: Cardiovascular;  Laterality: N/A;   AMPUTATION Right 01/22/2021   Procedure: 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;  Surgeon: [name], MD;  Location: [place];  Service: Orthopedics;  Laterality: Right;   AMPUTATION Right 03/17/2021   Procedure: RIGHT BELOW KNEE AMPUTATION;  Surgeon: [name], MD;  Location: [place];  Service: Orthopedics;  Laterality: Right;   ANKLE FUSION Right 01/22/2021   Procedure: RIGHT FOOT TIBIOCALCANEAL FUSION;  Surgeon: [name], MD;  Location: [place];  Service: Orthopedics;  Laterality: Right;   ANTERIOR FUSION CERVICAL SPINE  2012   CARDIAC CATHETERIZATION  2011   CARDIAC CATHETERIZATION N/A 11/09/2016   Procedure: Right Heart Cath;  Surgeon: Belva Crome, MD;  Location: [place] [facility];  Service: Cardiovascular;  Laterality: N/A;   carpel tunnel     left wrist   CATARACT EXTRACTION     CATARACT EXTRACTION W/ INTRAOCULAR LENS  IMPLANT, BILATERAL  2013   CERVICAL LAMINECTOMY  2012   COLONOSCOPY N/A  01/14/2013   Procedure: COLONOSCOPY;  Surgeon: Irene Shipper, MD;  Location: [facility];  Service: Endoscopy;  Laterality: N/A;   CORONARY ARTERY BYPASS GRAFT  11/04/2020   LIMA-LAD, SVG-OM1, SVG-PDA (Dr Marney [facility]) dc 11/18/2020   EYE SURGERY     I & D EXTREMITY Right 02/19/2021   Procedure: RIGHT ANKLE DEBRIDEMENT AND PLACEMENT ANTIBIOTIC BEADS;  Surgeon: [name], MD;  Location: [place];  Service: Orthopedics;  Laterality: Right;   KNEE SURGERY  1998   left   LEFT HEART CATH AND CORONARY ANGIOGRAPHY N/A 07/10/2020   Procedure: LEFT HEART CATH AND CORONARY ANGIOGRAPHY;  Surgeon: Sherren Mocha, MD;  Location: [place] [facility];  Service: Cardiovascular;  Laterality: N/A;   LUMBAR LAMINECTOMY  2003   LUNG BIOPSY Left 12/26/2016   Procedure: LUNG BIOPSY;   Surgeon: [name], MD;  Location: [place];  Service: Thoracic;  Laterality: Left;   PACEMAKER IMPLANT N/A 03/30/2020   Procedure: PACEMAKER IMPLANT;  Surgeon: Evans Lance, MD;  Location: [place] [facility];  Service: Cardiovascular;  Laterality: N/A;   PERIPHERAL VASCULAR INTERVENTION Right 12/10/2020   Procedure: PERIPHERAL VASCULAR INTERVENTION;  Surgeon: Cherre Robins, MD;  Location: [place] [facility];  Service: Cardiovascular;  Laterality: Right;  SFA   POSTERIOR FUSION CERVICAL SPINE  2012   PPM GENERATOR REMOVAL N/A 12/14/2020   Procedure: PPM GENERATOR REMOVAL;  Surgeon: Evans Lance, MD;  Location: [place] [facility];  Service: Cardiovascular;  Laterality: N/A;   TEE WITHOUT CARDIOVERSION N/A 12/11/2020   Procedure: TRANSESOPHAGEAL ECHOCARDIOGRAM (TEE);  Surgeon: Geralynn Rile, MD;  Location: [place];  Service: Cardiovascular;  Laterality: N/A;   TRIGGER FINGER RELEASE  2011   4th finger left hand   VIDEO ASSISTED THORACOSCOPY Left 12/26/2016   Procedure: VIDEO ASSISTED THORACOSCOPY;  Surgeon: [name], MD;  Location: [place];  Service: Thoracic;  Laterality: Left;   VIDEO BRONCHOSCOPY N/A 12/26/2016   Procedure: VIDEO BRONCHOSCOPY;  Surgeon: [name], MD;  Location: [place];  Service: Thoracic;  Laterality: N/A;   Patient Active Problem List   Diagnosis Date Noted   Intermediate stage nonexudative age-related macular degeneration of both eyes 04/25/2022   Research study patient 12/24/2021   Post-operative pain    CKD (chronic kidney disease), stage II    Labile blood glucose    Pulmonary fibrosis ([place])    Hyponatremia    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[place]) 03/26/2021   S/P BKA (below knee amputation) unilateral, right ([place])    Shortness of breath    Acute blood loss anemia    Stage 3 chronic kidney disease ([place])    Atrial fibrillation ([place])    Controlled type 2 diabetes mellitus with hyperglycemia, with long-term current use of insulin  (H[facility])    Postoperative pain    Postoperative hemorrhagic shock 03/20/2021   Atherosclerosis of native arteries of extremities with gangrene, right leg ([place])    Hardware complicating wound infection ([place])    Abscess of bursa of right ankle 02/15/2021   Endocarditis 01/19/2021   Peripheral vascular disease ([place]) 12/29/2020   CHF (congestive heart failure), NYHA class II, chronic, diastolic ([place]) 96/03/5408   History of COVID-19 12/22/2020   SSS (sick sinus syndrome) ([place]) 12/22/2020   Acute bacterial endocarditis    MSSA bacteremia 12/09/2020   Diabetic foot ulcer ([place]) 12/09/2020   Foot drop, right foot    Generalized weakness 12/08/2020   Dehydration with hyponatremia 12/08/2020   Atrial  fibrillation, chronic ([place]) 12/08/2020   Elevated troponin level not due myocardial infarction 12/08/2020   Adrenal insufficiency ([place]) 11/14/2020   Orthostatic hypotension 11/14/2020   Physical deconditioning 11/14/2020   Difficulty sleeping 11/07/2020   Postoperative anemia due to acute blood loss 11/07/2020   Right ventricular dysfunction 11/05/2020   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/04/2020   Hearing loss 11/03/2020   Cardiac device in situ 09/09/2020   Coronary atherosclerosis due to lipid rich plaque 09/02/2020   COVID-19 virus infection 07/18/2020   Coronary artery disease involving native heart without angina pectoris 07/10/2020   Chronic kidney disease, stage 3a ([place]) 03/29/2020   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/28/2020   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 ([place]) 03/16/2020   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 ([place]) 03/16/2020   Right epiretinal membrane 03/16/2020   Posterior vitreous detachment of right eye 03/16/2020   Aortic stenosis, moderate 03/12/2020   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   Near syncope 04/27/2018   Hypoglycemia due to insulin 01/06/2018   Essential  hypertension 01/06/2018   Diabetes mellitus type 2, with complication, on long term insulin pump ([place]) 01/06/2018   Syncope and collapse 01/05/2018   Encounter for therapeutic drug monitoring 06/29/2017   OSA on CPAP 05/05/2017   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[place]) 01/05/2017   Abnormal chest x-ray 10/12/2016   Benign neoplasm of colon 01/14/2013    REFERRING DIAG: Z89.511 (ICD-10-CM) - Hx of right BKA  ONSET DATE: 05/04/2022 MD visit with release to WB on LLE  THERAPY DIAG:  Unsteadiness on feet  Other abnormalities of gait and mobility  Muscle weakness (generalized)  Abnormal posture  Acute pain of left shoulder  Stiffness of left shoulder, not elsewhere classified  Localized edema  Rationale for Evaluation and Treatment Rehabilitation  PERTINENT HISTORY: CKD st III, AV block Mobitz, pacemaker, pulmonary fibrosis, CAD, CABG 21, HTN, A-Fib, DM2, PAD, right BKA, Left TMA  PRECAUTIONS: ICD/Pacemaker - continue monitoring vitals with activity and calculated HRmax is 151 bpm  SUBJECTIVE: He is doing well today. The wound is doing well with no redness or drainage, and there is a small spot likely from the adhesive bandage. He has been practicing standing balance at home. He hasn't been walking much because of feeling unstable, and wasn't sure if it was dehydration but he reports that this just happens despite years of testing from physician. He has bought a dressing stick, but currently dons the AFO with a reacher.  PAIN:    Are you having pain? Yes: NPRS scale: today 0/10, over last week lowest 0/10  Pain location: left shoulder lateral just distal to joint Pain description: tingling when shoulder in improper posture. Aggravating factors: improper posture. Relieving factors: correcting posture   OBJECTIVE: (objective measures completed at initial evaluation unless otherwise dated)  COGNITION: Overall cognitive status: Within functional limits for tasks assessed    POSTURE: 05/09/2022:  rounded shoulders, forward head, and flexed trunk    LOWER EXTREMITY ROM:    UPPER EXTREMITY ROM:  ROM P:passive  A:active Left 06/23/22 Supine all limited by pain  Shoulder flexion 30*  Shoulder extension 0* neutral  Shoulder abduction 45*  Shoulder adduction   Shoulder internal rotation 20*  Shoulder external rotation 10*  Elbow flexion   Elbow extension -15*  Wrist flexion   Wrist extension   Wrist ulnar deviation   Wrist radial deviation   Wrist pronation  Wrist supination    (Blank rows = not tested)    ROM P:passive  A:active Right Eval 05/09/2022 Left Eval 05/09/2022  Hip flexion      Hip extension A: -25* standing A: -25* standing  Hip abduction      Hip adduction      Hip internal rotation      Hip external rotation      Knee flexion      Knee extension      Ankle dorsiflexion NA P: -17*  Ankle plantarflexion NA    Ankle inversion NA    Ankle eversion NA     (Blank rows = not tested)   LOWER EXTREMITY MMT:   MMT Right Eval 05/09/2022 Left Eval 05/09/2022  Hip flexion      Hip extension Functional testing 3-/5 Functional testing 3-/5  Hip abduction Functional testing 3-/5 Functional testing 3-/5  Hip adduction      Hip internal rotation      Hip external rotation      Knee flexion Functional testing 3-/5 Functional testing 3-/5  Knee extension Functional testing 3/5 Functional testing 3/5  Ankle dorsiflexion      Ankle plantarflexion      Ankle inversion      Ankle eversion      (Blank rows = not tested)   POSTURE:  06/23/2022  seated - head forward, rounded shoulders and posterior pelvic tilt  TRANSFERS: 05/09/2022:  Sit to stand: SBA and requires use of armrests from 20" stable w/c & needs RW for stabilization 05/09/2022:  Stand to sit: SBA and requires use of armrests from 20" stable w/c & needs RW for stabilization   GAIT: 05/09/2022:  Gait pattern: step to pattern, decreased step length- Left, decreased stance time-  Right, Right hip hike, antalgic, lateral hip instability, trunk flexed, and wide BOS Distance walked: 25' Assistive device utilized: Environmental consultant - 2 wheeled and TTA prosthesis RLE Level of assistance: Min A Comments: pt reports dizziness with turning in gait   UE FUNCTION: 06/23/2022:  Pt reports that he uses RUE for fine motor like writing but uses both hands for gross motor or sports.  Seated - pt unable to raise LUE in flexion or abd due to pain.  Supine - pt unable to move LUE in flexion or abd due to pain.  PROM flexion > pain than abd   Pt tolerates elbow ext with palm supinated but with palm neutral causes shoulder pain. Also painful with elbow already extended and pronate palm.   PT simulated pushing on RW thru LUE with manual resistance.  Pt reports slight increase in pain but not as sharp as above function.    FUNCTIONAL TESTs:  Merrilee Jansky Balance Scale: 05/09/2022:  9/56 & tasks of Berg with RW support 27/56    [facility] PT Assessment - 05/09/22 0930                Berg Balance Test    Sit to Stand Needs moderate or maximal assist to stand   task with RW support = 3    Standing Unsupported Unable to stand 30 seconds unassisted   task with RW support = 3    Sitting with Back Unsupported but Feet Supported on Floor or Stool Able to sit safely and securely 2 minutes     Stand to Sit Uses backs of legs against chair to control descent   task with RW support = 3    Transfers Able to transfer safely, definite  need of hands     Standing Unsupported with Eyes Closed Needs help to keep from falling   task with RW support = 3    Standing Unsupported with Feet Together Needs help to attain position and unable to hold for 15 seconds   task with RW support = 2    From Standing, Reach Forward with Outstretched Arm Loses balance while trying/requires external support   task with RW support = 1    From Standing Position, Pick up Object from Floor Unable to try/needs assist to keep balance   task with RW  support = 2    From Standing Position, Turn to Look Behind Over each Shoulder Needs assist to keep from losing balance and falling   task with RW support = 1    Turn 360 Degrees Needs assistance while turning   task with RW support = 0    Standing Unsupported, Alternately Place Feet on Step/Stool Needs assistance to keep from falling or unable to try   task with RW support = 0    Standing Unsupported, One Foot in [employer] balance while stepping or standing   task with RW support = 1    Standing on One Leg Unable to try or needs assist to prevent fall   task with RW support = 1    Total Score 9     Berg comment: tasks of Berg with RW support 27/56                     CURRENT PROSTHETIC WEAR ASSESSMENT: 05/09/2022:   Patient is dependent with: skin check, residual limb care, care of non-amputated limb, prosthetic cleaning, ply sock cleaning, and correct ply sock adjustment Donning prosthesis: Modified independence Doffing prosthesis: Modified independence Prosthetic wear tolerance: >90% of awake hours/day, 7 days/week Prosthetic weight bearing tolerance: 5 minutes Edema: LLE pitting edema with >10 sec capillary refill Residual limb condition: Right BKA limb has no openings, normal temperature & moisture.  Redness patella & patella tendon from friction of liner with knee flexed. Cylinderical shape.  LLE TMA pt's wife showed picture of wound on dorsum of foot that is being managed by PCP.   Prosthetic description: silicon liner with pin lock suspension, total contact socket, dynamic response foot.      TODAY'S TREATMENT:  07/25/2022 Vitals -following ramp ascend and descend and 25' amb RW: SpO2 91%, HR 126 bpm -following 2 min seated rest: SpO2 96%, HR 99 bpm -following 110' amb RW: SpO2 95%, HR 108 bpm  Prosthetic Training with right TTA prosthesis & left dynamic AFO and custom shoe with toe filler and carbon plate: Pt demo donning AFO with simulated reacher, required correction of  shin plate placement and ankle strap twisting with cues on potential skin irritation PT education on sweat management on residual limb and use of mineral oil to manage friction, pt verbalized understanding Sit to/from stand chair without armrests x 2 with minA for ascent and descent Pt amb 25' x 2 and 110' with RW and SBA with visual target for foot placement, PT demo supervision with w/c follow for wife and she verbalized understanding Pt ascend and descend curb x 1 ea. LE with RW with CGA following PT demo and verbal explanation of step and walker sequence, showed decreased eccentric control with RLE descent Pt ascend and descend ramp x1 with RW with CGA at trunk and minA to control walker during ramp descent, following PT demo and verbal explanation of weight  bearing, step and walker sequencing  Self Care: PT education on signs of dehydration and water intake suggestions, pt verbalized understanding  07/18/2022 Prosthetic Training with right TTA prosthesis & left dynamic AFO and custom shoe with toe filler and carbon plate: PT education on purpose and structure of AFO, prosthetic care and planning in case of second BKA and risk for additional amputation, donning prosthesis and AFO. Pt verbalized understanding and donned AFO x 2  PT assessed for prosthesis height difference, determined need for 3/8" length increase and referred pt to prosthetist for modification today, pt and wife verbalized understanding Pt ambulate 74' with RW and SBA with visual target for foot placement  Neuromuscular re-ed: working on upright balance with shoes in //bars Static stance x 30 sec, multiple instances of UE touch for support and visual cueing for posture Modified tandem stance x 30 sec bil with full step forward, multiple instances of UE touch for support visual cueing for posture Alternating hip abduction x 15 bil. with heavy UE support on bars, verbal cueing for upright posture Alternating hip extension x 5  bil with heavy UE support and x 5 bil with fingertip UE support, verbal cueing for upright posture Vitals following standing balance tasks: SpO2 99% HR 66 bpm  07/13/2022 Prosthetic Training with right TTA prosthesis & left dynamic AFO and custom shoe with toe filler and carbon plate Resting vitals: SpO2 97% HR 61 bpm PT assessed fit of AFO and educated about proper donning and fit of AFO and shoe with visual and verbal demo. PT suggested notifying prosthetist of anterior medial proximal tibia soreness with possibility of padding or relief on anterior plate in that area. Pt and wife verbalized understanding PT suggested bringing in walker and rollator for assessment and training of use, pt and wife verbalized willingness to bring them in Static standing 2 min supervision trying to lift hands from RW, required fingertip BUE support on RW to maintain balance and longest time was 19 sec without UE contact PT educated on safe set up of counter behind him to practice standing without UE support at home, pt verbalized understanding and stood 2 min with fingertip UE support on RW, trying to lift hands from walker Sit to/from stand from 18" chair without armrests x 2, required visual demo and verbal explanation of form and proper UE use for safety Standing hip flexor, chest, anterior shoulder stretch at counter for upright posturing x 3 min with cervical retraction x 5 reps Pt walked 25' x 2 with RW and SBA, observed shorter stance time on Lt foot with foot flat contact on Rt, LLE adduction Pt walked 105' with RW and SBA with visual target for foot placement, improved Lt foot placement and Rt foot flat contact Vitals following 105' walk: SpO2 99% HR 107 bpm  Self Care: PT advised wrapping wound with gauze without bandage wrap directly touching skin to prevent wounds from removal of bandage, pt and wife verbalized understanding   [place]   07/05/22 0935  PT Education  Education Details  Access Code: [email or web address] updated & reviewed  Person(s) Educated Patient;Spouse  Methods Explanation;Demonstration;Tactile cues;Verbal cues;Handout  Comprehension Verbalized understanding;Returned demonstration;Need further instruction   Access Code: [email or web address] URL: [email or web address] Date: 07/05/2022 Prepared by: Jamey Reas  Exercises - Seated Correct Posture  - 1 x daily - 7 x weekly - Seated Scapular Retraction  - 2-5 x daily - 7 x weekly - 1 sets - 10 reps - 5 seconds hold - Supine Chin Tucks  on Flat Ball  - 2-5 x daily - 7 x weekly - 1 sets - 10 reps - 5 seconds hold - Seated Isometric Cervical Retraction with Chin Tucks with Pillow behind head  - 2-5 x daily - 7 x weekly - 1 sets - 10 reps - 5 seconds hold - Circular Shoulder Pendulum with Table Support  - 1-2 x daily - 7 x weekly - 1-2 sets - 10 reps - 5 seconds hold - Flexion-Extension Shoulder Pendulum with Table Support  - 1-2 x daily - 7 x weekly - 1-2 sets - 10 reps - Horizontal Shoulder Pendulum with Table Support  - 1-2 x daily - 7 x weekly - 1-2 sets - 10 reps - Supine Shoulder Flexion Extension AAROM with Dowel  - 1 x daily - 7 x weekly - 1-2 sets - 10 reps - 5 seconds hold - Supine Shoulder Abduction AAROM with Dowel  - 1 x daily - 7 x weekly - 1-2 sets - 10 reps - 5 seconds hold - Supine Shoulder External Rotation with Dowel  - 1 x daily - 7 x weekly - 1-2 sets - 10 reps - 5 seconds hold    06/23/22 1413  PT Education  Education Details Access Code: [email or web address]  Person(s) Educated Patient;Spouse  Methods Explanation;Handout;Demonstration;Tactile cues;Verbal cues  Comprehension Verbalized understanding;Returned demonstration;Verbal cues required;Tactile cues required;Need further instruction   Access Code: [email or web address] URL: [email or web address] Date: 06/23/2022 Prepared by: Jamey Reas  Exercises - Seated Correct Posture  - 1 x daily - 7 x weekly - Seated Scapular Retraction  - 2-5 x daily  - 7 x weekly - 1 sets - 10 reps - 5 seconds hold - Supine Chin Tucks on Flat Ball  - 2-5 x daily - 7 x weekly - 1 sets - 10 reps - 5 seconds hold - Seated Isometric Cervical Retraction with Chin Tucks with Pillow behind head  - 2-5 x daily - 7 x weekly - 1 sets - 10 reps - 5 seconds hold   05/11/22 1636  PT Education  Education Details HEP at sink & sitting on bar stool to develop back strength / endurance  Person(s) Educated Patient;Spouse  Methods Explanation;Demonstration;Tactile cues;Verbal cues;Handout  Comprehension Verbalized understanding;Returned demonstration;Verbal cues required;Tactile cues required;Need further instruction   Neuromuscular Reed-  cues on proprioception RLE using residual limb, balance & standing tolerance.   Do each exercise 1-2  times per day Do each exercise 5-10 repetitions Hold each exercise for 2 seconds to feel your location  AT [place].  Try to find this position when standing still for activities.   USE TAPE ON FLOOR TO MARK THE MIDLINE POSITION which is even with middle of sink.  You also should try to feel with your limb pressure in socket.  You are trying to feel with limb what you used to feel with the bottom of your foot.  Side to Side Shift: Moving your hips only (not shoulders): move weight onto your left leg, HOLD/FEEL pressure in socket.  Move back to equal weight on each leg, HOLD/FEEL pressure in socket. Move weight onto your right leg, HOLD/FEEL pressure in socket. Move back to equal weight on each leg, HOLD/FEEL pressure in socket. Repeat.  Start with both hands on sink, progress to hand on prosthetic side only, then no hands.  Front to Back Shift: Moving your hips only (not shoulders): move your weight forward onto your toes, HOLD/FEEL pressure  in socket. Move your weight back to equal Flat Foot on both legs, HOLD/FEEL  pressure in socket. Move your weight back onto your  heels, HOLD/FEEL  pressure in socket. Move your weight back to equal on both legs, HOLD/FEEL  pressure in socket. Repeat.  Start with both hands on sink, progress to hand on prosthetic side only, then no hands.  Moving Cones / Cups: With equal weight on each leg: Hold on with one hand the first time, then progress to no hand supports. Move cups from one side of sink to the other. Place cups ~2" out of your reach, progress to 10" beyond reach.  Place one hand in middle of sink and reach with other hand. Do both arms.  Then hover one hand and move cups with other hand.  Overhead/Upward Reaching: alternated reaching up to top cabinets or ceiling if no cabinets present. Keep equal weight on each leg. Start with one hand support on counter while other hand reaches and progress to no hand support with reaching.  ace one hand in middle of sink and reach with other hand. Do both arms.  Then hover one hand and move cups with other hand.  5.   Looking Over Shoulders: With equal weight on each leg: alternate turning to look over your shoulders with one hand support on counter as needed.  Start with head motions only to look in front of shoulder, then even with shoulder and progress to looking behind you. To look to side, move head /eyes, then shoulder on side looking pulls back, shift more weight to side looking and pull hip back. Place one hand in middle of sink and let go with other hand so your shoulder can pull back. Switch hands to look other way.   Then hover one hand and look over shoulder. If looking right, use left hand at sink. If looking left, use right hand at sink. 6.  Alternate Tapping Toes or Stepping to lower shelf of cabinet  Move items under cabinet out of your way. Shift your hips/pelvis so weight on stance leg & Tighten muscles in hip.  SLOWLY step other leg so front of foot is in cabinet. Then step back to floor. Repeat with other leg.     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 He currently tolerates household gait distances with a RW and requires supervision due to unsteadiness. He completed ascent and descent of a ramp and curb today with the RW and minA on the ramp to control the walker, and will benefit from increased walking tolerance and further reinforcement of ramp and curb before appearing safe to use the RW for community mobility. He currently requires some cueing and assistance with properly donning the AFO, but wears the prosthesis and AFO for majority of waking hours without pain and with healing wounds on Lt foot. He remains limited in static balance ability without UE support and is unlikely to meet LTG for Berg balance without RW support by 8/31, so static balance will continue to be emphasized for home program. He continues to benefit from skilled PT to address prosthesis and orthosis management, gait deviations, balance impairments, and strength deficits impacting functional mobility.  OBJECTIVE IMPAIRMENTS .Abnormal gait, cardiopulmonary status limiting activity, decreased activity tolerance, decreased balance, decreased endurance, decreased knowledge of use of DME, decreased mobility, decreased  ROM, decreased strength, dizziness, increased edema, impaired flexibility, postural dysfunction, prosthetic dependency , obesity, and pain.    ACTIVITY LIMITATIONS carrying, lifting, bending, standing, stairs, transfers, locomotion level, UE use with ADLs, sleep    PARTICIPATION LIMITATIONS: driving, community activity, and yard work, UE use with ADLs &  household activities   [place], Time since onset of injury/illness/exacerbation, and 3+ comorbidities: see PMH  are also affecting patient's functional outcome.    REHAB POTENTIAL: Good   CLINICAL DECISION MAKING: Evolving/moderate complexity   EVALUATION  COMPLEXITY: Moderate     GOALS: Goals reviewed with patient? Yes  SHORT TERM GOALS: Target date: 07/14/2022  1. Patient able to stand with no UE support for 1 min with supervision. Baseline: SEE OBJECTIVE DATA Goal status: ongoing 07/25/22   2. Patient independent with donning/doffing new AFO or prosthesis. Baseline: SEE OBJECTIVE DATA Goal status: ongoing 07/25/22 still requiring assistance for AFO donning  3. Patient demo proper sitting posture for left shoulder pain & function.  Goal status: MET 07/11/2022  4. Left shoulder pain reported </= 7/10 Goal status: MET 07/11/2022   LONG TERM GOALS: Target date: 08/04/2022   Patient demonstrates & verbalized understanding of prosthetic care to enable safe utilization of prosthesis. Baseline: SEE OBJECTIVE DATA Goal status: INITIAL   Patient tolerates right prosthesis & left AFO / partial foot prosthesis wear >90% of awake hours without skin or limb pain issues. Baseline: SEE OBJECTIVE DATA Goal status: INITIAL   Berg Balance >/= 20/56 and tasks of Berg with RW support >/= 45/56 to indicate lower fall risk with standing ADLs Baseline: SEE OBJECTIVE DATA Goal status: INITIAL   Patient ambulates >300' with prosthesis & LRAD modified independent Baseline: SEE OBJECTIVE DATA Goal status: INITIAL   Patient negotiates ramps, curbs & stairs with single rail with prosthesis & LRAD modified independent. Baseline: SEE OBJECTIVE DATA Goal status: INITIAL  6. Left shoulder pain </= 3/10 with functional activities.  Goal status: new 06/23/2022  7. Left shoulder AROM within 75% of Right shoulder  Goal status: new 06/23/2022  8.patient able to use LUE for ADLs, gait, transfers, etc. Safely  Goal status: new 06/23/2022     PLAN: PT FREQUENCY: 2x/week   PT DURATION: 13 weeks (90 days);   shoulder included on 06/23/2022 for 6 weeks.    PLANNED INTERVENTIONS: Therapeutic exercises, Therapeutic activity, Neuromuscular re-education, Balance  training, Gait training, Patient/Family education, Self Care, Joint mobilization, Stair training, Vestibular training, Canalith repositioning, Prosthetic training, DME instructions, Dry Needling, Electrical stimulation, Cryotherapy, Moist heat, Taping, Vasopneumatic device, Ultrasound, Manual therapy, Re-evaluation, and physical performance testing    PLAN FOR NEXT SESSION: continue to monitor HR & SpO2 with activities, AFO donning, progress gait with RW increasing walking tolerance, reinforce curb and ramp negotiation, progress static balance HEP as indicated  Jana Hakim, Student-PT 07/25/2022, 12:54 PM  This entire session of physical therapy was performed under the direct supervision of PT signing evaluation /treatment. PT reviewed note and agrees.  Jamey Reas, PT, DPT 07/25/2022, 12:56 PM

## 2022-07-26 NOTE — Therapy (Signed)
OUTPATIENT PHYSICAL THERAPY TREATMENT NOTE   Patient Name: Shane [name] MRN: [id number] DOB:11-Dec-1940, 81 y.o., male Today's Date: 07/27/2022  PCP: Burnard Bunting, MD REFERRING PROVIDER: [name], MD for TTA & [name], NP for left shoulder   END OF SESSION:   PT End of Session - 07/27/22 0804     Visit Number 21    Number of Visits 26    Date for PT Re-Evaluation 08/04/22    Authorization Type UHC Medicare    Authorization Time Period [facility] MEDICARE  $20 COPAY    Progress Note Due on Visit 30    PT Start Time 0801    PT Stop Time 0846    PT Time Calculation (min) 45 min    Equipment Utilized During Treatment Gait belt    Activity Tolerance Patient tolerated treatment well;Patient limited by fatigue    Behavior During Therapy WFL for tasks assessed/performed             Past Medical History:  Diagnosis Date   Anxiety    AV block, Mobitz 1    Cataract    Chronic kidney disease    d/t DM   CKD (chronic kidney disease), stage III ([place])    Depression    Diabetes mellitus    Vgo disposal insulin bolus  simular to insulin pump   Dyspnea    GERD (gastroesophageal reflux disease)    History of kidney stones    passed   Hyperlipidemia    Hypertension    Idiopathic pulmonary fibrosis ([place]) 11/2016   ILD (interstitial lung disease) ([place])    Moderate aortic stenosis    a. 10/2019 Echo: EF 55-60%, Gr2 DD. Nl RV.    Neuromuscular disorder ([place])    Neuropathy associated with endocrine disorder ([place])    Nonobstructive CAD (coronary artery disease)    a. 2012 Cath: mod, nonobs dzs; b. 10/2016 MV: EF 60%, no ischemia.   OSA on CPAP 05/05/2017   Unattended Home Sleep Test 7/2/813-AHI 38.6/hour, desaturation to 64%, body weight 261 pounds   PONV (postoperative nausea and vomiting)    Sleep apnea     uses cpap asked to bring mask and tubing   Past Surgical History:  Procedure Laterality Date   ABDOMINAL AORTOGRAM W/LOWER EXTREMITY N/A 12/10/2020    Procedure: ABDOMINAL AORTOGRAM W/LOWER EXTREMITY;  Surgeon: Cherre Robins, MD;  Location: [place] [facility];  Service: Cardiovascular;  Laterality: N/A;   AMPUTATION Right 01/22/2021   Procedure: 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;  Surgeon: [name], MD;  Location: [place];  Service: Orthopedics;  Laterality: Right;   AMPUTATION Right 03/17/2021   Procedure: RIGHT BELOW KNEE AMPUTATION;  Surgeon: [name], MD;  Location: [place];  Service: Orthopedics;  Laterality: Right;   ANKLE FUSION Right 01/22/2021   Procedure: RIGHT FOOT TIBIOCALCANEAL FUSION;  Surgeon: [name], MD;  Location: [place];  Service: Orthopedics;  Laterality: Right;   ANTERIOR FUSION CERVICAL SPINE  2012   CARDIAC CATHETERIZATION  2011   CARDIAC CATHETERIZATION N/A 11/09/2016   Procedure: Right Heart Cath;  Surgeon: Belva Crome, MD;  Location: [place] [facility];  Service: Cardiovascular;  Laterality: N/A;   carpel tunnel     left wrist   CATARACT EXTRACTION     CATARACT EXTRACTION W/ INTRAOCULAR LENS  IMPLANT, BILATERAL  2013   CERVICAL LAMINECTOMY  2012   COLONOSCOPY N/A 01/14/2013   Procedure: COLONOSCOPY;  Surgeon: Irene Shipper, MD;  Location: [facility];  Service: Endoscopy;  Laterality: N/A;   CORONARY ARTERY BYPASS GRAFT  11/04/2020   LIMA-LAD, SVG-OM1, SVG-PDA (Dr [facility]) dc 11/18/2020   EYE SURGERY     I & D EXTREMITY Right 02/19/2021   Procedure: RIGHT ANKLE DEBRIDEMENT AND PLACEMENT ANTIBIOTIC BEADS;  Surgeon: [name], MD;  Location: [place];  Service: Orthopedics;  Laterality: Right;   KNEE SURGERY  1998   left   LEFT HEART CATH AND CORONARY ANGIOGRAPHY N/A 07/10/2020   Procedure: LEFT HEART CATH AND CORONARY ANGIOGRAPHY;  Surgeon: Sherren Mocha, MD;  Location: [place] [facility];  Service: Cardiovascular;  Laterality: N/A;   LUMBAR LAMINECTOMY  2003   LUNG BIOPSY Left 12/26/2016   Procedure: LUNG BIOPSY;  Surgeon: [name], MD;  Location: [place];  Service: Thoracic;   Laterality: Left;   PACEMAKER IMPLANT N/A 03/30/2020   Procedure: PACEMAKER IMPLANT;  Surgeon: Evans Lance, MD;  Location: [place] [facility];  Service: Cardiovascular;  Laterality: N/A;   PERIPHERAL VASCULAR INTERVENTION Right 12/10/2020   Procedure: PERIPHERAL VASCULAR INTERVENTION;  Surgeon: Cherre Robins, MD;  Location: [place] [facility];  Service: Cardiovascular;  Laterality: Right;  SFA   POSTERIOR FUSION CERVICAL SPINE  2012   PPM GENERATOR REMOVAL N/A 12/14/2020   Procedure: PPM GENERATOR REMOVAL;  Surgeon: Evans Lance, MD;  Location: [place] [facility];  Service: Cardiovascular;  Laterality: N/A;   TEE WITHOUT CARDIOVERSION N/A 12/11/2020   Procedure: TRANSESOPHAGEAL ECHOCARDIOGRAM (TEE);  Surgeon: Geralynn Rile, MD;  Location: [place];  Service: Cardiovascular;  Laterality: N/A;   TRIGGER FINGER RELEASE  2011   4th finger left hand   VIDEO ASSISTED THORACOSCOPY Left 12/26/2016   Procedure: VIDEO ASSISTED THORACOSCOPY;  Surgeon: [name], MD;  Location: [place];  Service: Thoracic;  Laterality: Left;   VIDEO BRONCHOSCOPY N/A 12/26/2016   Procedure: VIDEO BRONCHOSCOPY;  Surgeon: [name], MD;  Location: [place];  Service: Thoracic;  Laterality: N/A;   Patient Active Problem List   Diagnosis Date Noted   Intermediate stage nonexudative age-related macular degeneration of both eyes 04/25/2022   Research study patient 12/24/2021   Post-operative pain    CKD (chronic kidney disease), stage II    Labile blood glucose    Pulmonary fibrosis ([place])    Hyponatremia    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[place]) 03/26/2021   S/P BKA (below knee amputation) unilateral, right ([place])    Shortness of breath    Acute blood loss anemia    Stage 3 chronic kidney disease ([place])    Atrial fibrillation ([place])    Controlled type 2 diabetes mellitus with hyperglycemia, with long-term current use of insulin ([facility])    Postoperative pain    Postoperative hemorrhagic shock  03/20/2021   Atherosclerosis of native arteries of extremities with gangrene, right leg ([place])    Hardware complicating wound infection ([place])    Abscess of bursa of right ankle 02/15/2021   Endocarditis 01/19/2021   Peripheral vascular disease ([place]) 12/29/2020   CHF (congestive heart failure), NYHA class II, chronic, diastolic ([place]) 79/39/0300   History of COVID-19 12/22/2020   SSS (sick sinus syndrome) ([place]) 12/22/2020   Acute bacterial endocarditis    MSSA bacteremia 12/09/2020   Diabetic foot ulcer ([place]) 12/09/2020   Foot drop, right foot    Generalized weakness 12/08/2020   Dehydration with hyponatremia 12/08/2020   Atrial fibrillation, chronic ([place]) 12/08/2020   Elevated troponin level not due myocardial infarction 12/08/2020  Adrenal insufficiency ([place]) 11/14/2020   Orthostatic hypotension 11/14/2020   Physical deconditioning 11/14/2020   Difficulty sleeping 11/07/2020   Postoperative anemia due to acute blood loss 11/07/2020   Right ventricular dysfunction 11/05/2020   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/04/2020   Hearing loss 11/03/2020   Cardiac device in situ 09/09/2020   Coronary atherosclerosis due to lipid rich plaque 09/02/2020   COVID-19 virus infection 07/18/2020   Coronary artery disease involving native heart without angina pectoris 07/10/2020   Chronic kidney disease, stage 3a ([place]) 03/29/2020   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/28/2020   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 ([place]) 03/16/2020   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 ([place]) 03/16/2020   Right epiretinal membrane 03/16/2020   Posterior vitreous detachment of right eye 03/16/2020   Aortic stenosis, moderate 03/12/2020   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   Near syncope 04/27/2018   Hypoglycemia due to insulin 01/06/2018   Essential hypertension 01/06/2018   Diabetes mellitus type 2, with  complication, on long term insulin pump ([place]) 01/06/2018   Syncope and collapse 01/05/2018   Encounter for therapeutic drug monitoring 06/29/2017   OSA on CPAP 05/05/2017   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[place]) 01/05/2017   Abnormal chest x-ray 10/12/2016   Benign neoplasm of colon 01/14/2013    REFERRING DIAG: Z89.511 (ICD-10-CM) - Hx of right BKA  ONSET DATE: 05/04/2022 MD visit with release to WB on LLE  THERAPY DIAG:  Unsteadiness on feet  Other abnormalities of gait and mobility  Muscle weakness (generalized)  Abnormal posture  Acute pain of left shoulder  Stiffness of left shoulder, not elsewhere classified  Localized edema  Rationale for Evaluation and Treatment Rehabilitation  PERTINENT HISTORY: CKD st III, AV block Mobitz, pacemaker, pulmonary fibrosis, CAD, CABG 21, HTN, A-Fib, DM2, PAD, right BKA, Left TMA  PRECAUTIONS: ICD/Pacemaker - continue monitoring vitals with activity and calculated HRmax is 151 bpm  SUBJECTIVE: He has a wound on his foot from hitting it getting off the scooter, and he and his wife report difficulty getting on and off the scooter with the length of the shoes crossing the median of the scooter. He is seeing the NP tomorrow and they will show the wound to her. He also presented with a bleeding wound on his Lt arm. Shoulder is doing well.  PAIN:    Are you having pain? Yes: NPRS scale: today 0/10, over last week lowest 0/10  Pain location: left shoulder lateral just distal to joint Pain description: tingling when shoulder in improper posture. Aggravating factors: improper posture. Relieving factors: correcting posture   OBJECTIVE: (objective measures completed at initial evaluation unless otherwise dated)  COGNITION: Overall cognitive status: Within functional limits for tasks assessed   POSTURE: 05/09/2022:  rounded shoulders, forward head, and flexed trunk    LOWER EXTREMITY ROM:    UPPER EXTREMITY ROM:  ROM P:passive   A:active Left 06/23/22 Supine all limited by pain  Shoulder flexion 30*  Shoulder extension 0* neutral  Shoulder abduction 45*  Shoulder adduction   Shoulder internal rotation 20*  Shoulder external rotation 10*  Elbow flexion   Elbow extension -15*  Wrist flexion   Wrist extension   Wrist ulnar deviation   Wrist radial deviation   Wrist pronation   Wrist supination    (Blank rows = not tested)    ROM P:passive  A:active Right Eval 05/09/2022 Left Eval 05/09/2022  Hip flexion      Hip extension A: -25* standing A: -25* standing  Hip abduction      Hip adduction      Hip internal rotation      Hip external rotation      Knee flexion      Knee extension      Ankle dorsiflexion NA P: -17*  Ankle plantarflexion NA    Ankle inversion NA    Ankle eversion NA     (Blank rows = not tested)   LOWER EXTREMITY MMT:   MMT Right Eval 05/09/2022 Left Eval 05/09/2022  Hip flexion      Hip extension Functional testing 3-/5 Functional testing 3-/5  Hip abduction Functional testing 3-/5 Functional testing 3-/5  Hip adduction      Hip internal rotation      Hip external rotation      Knee flexion Functional testing 3-/5 Functional testing 3-/5  Knee extension Functional testing 3/5 Functional testing 3/5  Ankle dorsiflexion      Ankle plantarflexion      Ankle inversion      Ankle eversion      (Blank rows = not tested)   POSTURE:  06/23/2022  seated - head forward, rounded shoulders and posterior pelvic tilt  TRANSFERS: 05/09/2022:  Sit to stand: SBA and requires use of armrests from 20" stable w/c & needs RW for stabilization 05/09/2022:  Stand to sit: SBA and requires use of armrests from 20" stable w/c & needs RW for stabilization   GAIT: 05/09/2022:  Gait pattern: step to pattern, decreased step length- Left, decreased stance time- Right, Right hip hike, antalgic, lateral hip instability, trunk flexed, and wide BOS Distance walked: 25' Assistive device utilized: Environmental consultant - 2  wheeled and TTA prosthesis RLE Level of assistance: Min A Comments: pt reports dizziness with turning in gait   UE FUNCTION: 06/23/2022:  Pt reports that he uses RUE for fine motor like writing but uses both hands for gross motor or sports.  Seated - pt unable to raise LUE in flexion or abd due to pain.  Supine - pt unable to move LUE in flexion or abd due to pain.  PROM flexion > pain than abd   Pt tolerates elbow ext with palm supinated but with palm neutral causes shoulder pain. Also painful with elbow already extended and pronate palm.   PT simulated pushing on RW thru LUE with manual resistance.  Pt reports slight increase in pain but not as sharp as above function.    FUNCTIONAL TESTs:  Merrilee Jansky Balance Scale: 05/09/2022:  9/56 & tasks of Berg with RW support 27/56    [facility] PT Assessment - 05/09/22 0930                Berg Balance Test    Sit to Stand Needs moderate or maximal assist to stand   task with RW support = 3    Standing Unsupported Unable to stand 30 seconds unassisted   task with RW support = 3    Sitting with Back Unsupported but Feet Supported on Floor or Stool Able to sit safely and securely 2 minutes     Stand to Sit Uses backs of legs against chair to control descent   task with RW support = 3    Transfers Able to transfer safely, definite need of hands     Standing Unsupported with Eyes Closed Needs help to keep from falling   task with RW support =  3    Standing Unsupported with Feet Together Needs help to attain position and unable to hold for 15 seconds   task with RW support = 2    From Standing, Reach Forward with Outstretched Arm Loses balance while trying/requires external support   task with RW support = 1    From Standing Position, Pick up Object from Floor Unable to try/needs assist to keep balance   task with RW support = 2    From Standing Position, Turn to Look Behind Over each Shoulder Needs assist to keep from losing balance and falling   task with RW  support = 1    Turn 360 Degrees Needs assistance while turning   task with RW support = 0    Standing Unsupported, Alternately Place Feet on Step/Stool Needs assistance to keep from falling or unable to try   task with RW support = 0    Standing Unsupported, One Foot in [employer] balance while stepping or standing   task with RW support = 1    Standing on One Leg Unable to try or needs assist to prevent fall   task with RW support = 1    Total Score 9     Berg comment: tasks of Berg with RW support 27/56                     CURRENT PROSTHETIC WEAR ASSESSMENT: 05/09/2022:   Patient is dependent with: skin check, residual limb care, care of non-amputated limb, prosthetic cleaning, ply sock cleaning, and correct ply sock adjustment Donning prosthesis: Modified independence Doffing prosthesis: Modified independence Prosthetic wear tolerance: >90% of awake hours/day, 7 days/week Prosthetic weight bearing tolerance: 5 minutes Edema: LLE pitting edema with >10 sec capillary refill Residual limb condition: Right BKA limb has no openings, normal temperature & moisture.  Redness patella & patella tendon from friction of liner with knee flexed. Cylinderical shape.  LLE TMA pt's wife showed picture of wound on dorsum of foot that is being managed by PCP.   Prosthetic description: silicon liner with pin lock suspension, total contact socket, dynamic response foot.      TODAY'S TREATMENT:  07/27/2022 Prosthetic Training with right TTA prosthesis & left dynamic AFO and custom shoe with toe filler and carbon plate: PT doff AFO for inspection of Lt foot, wound remained covered and pt and wife were instructed to show NP at appointment tomorrow Pt don AFO with simulated reacher, no verbal corrections required Pt ambulate 25' with RW and SBA with visual target for foot placement and verbal cueing to reduce Lt foot crossover Pt ascend and descend curb and ramp x1 with RW with CGA to SBA, pt required no  cueing and appears safe to navigate ramp and curb with RW Vitals following walk, ramp, and curb: SpO2 90% HR 123 bpm PT discuss shoe clearance concern with prosthetist and instruct pt and wife to search for shoes size 10.5 or 11 extra deep & wide within their out-of-pocket budget to discuss fit with prosthetist, they both verbalized understanding and size instructions were written for them  Neuro Re-ed: Static stance x 30 sec, multiple instances of UE touch for support Modified tandem stance x 30 sec bil with full step forward, multiple instances of UE touch for support Addition of modified tandem stance to static balance at home with reminder of set up for safety, pt verbalized understanding  07/25/2022 Vitals -following ramp ascend and descend and 25' amb RW: SpO2 91%,  HR 126 bpm -following 2 min seated rest: SpO2 96%, HR 99 bpm -following 110' amb RW: SpO2 95%, HR 108 bpm  Prosthetic Training with right TTA prosthesis & left dynamic AFO and custom shoe with toe filler and carbon plate: Pt demo donning AFO with simulated reacher, required correction of shin plate placement and ankle strap twisting with cues on potential skin irritation PT education on sweat management on residual limb and use of mineral oil to manage friction, pt verbalized understanding Sit to/from stand chair without armrests x 2 with minA for ascent and descent Pt amb 25' x 2 and 110' with RW and SBA with visual target for foot placement, PT demo supervision with w/c follow for wife and she verbalized understanding Pt ascend and descend curb x 1 ea. LE with RW with CGA following PT demo and verbal explanation of step and walker sequence, showed decreased eccentric control with RLE descent Pt ascend and descend ramp x1 with RW with CGA at trunk and minA to control walker during ramp descent, following PT demo and verbal explanation of weight bearing, step and walker sequencing  Self Care: PT education on signs of  dehydration and water intake suggestions, pt verbalized understanding  07/18/2022 Prosthetic Training with right TTA prosthesis & left dynamic AFO and custom shoe with toe filler and carbon plate: PT education on purpose and structure of AFO, prosthetic care and planning in case of second BKA and risk for additional amputation, donning prosthesis and AFO. Pt verbalized understanding and donned AFO x 2  PT assessed for prosthesis height difference, determined need for 3/8" length increase and referred pt to prosthetist for modification today, pt and wife verbalized understanding Pt ambulate 34' with RW and SBA with visual target for foot placement  Neuromuscular re-ed: working on upright balance with shoes in //bars Static stance x 30 sec, multiple instances of UE touch for support and visual cueing for posture Modified tandem stance x 30 sec bil with full step forward, multiple instances of UE touch for support visual cueing for posture Alternating hip abduction x 15 bil. with heavy UE support on bars, verbal cueing for upright posture Alternating hip extension x 5 bil with heavy UE support and x 5 bil with fingertip UE support, verbal cueing for upright posture Vitals following standing balance tasks: SpO2 99% HR 66 bpm   HOME EXERCISE PROGRAM  Access Code: [email or web address] URL: [email or web address] Date: 07/05/2022 Prepared by: Jamey Reas  Exercises - Seated Correct Posture  - 1 x daily - 7 x weekly - Seated Scapular Retraction  - 2-5 x daily - 7 x weekly - 1 sets - 10 reps - 5 seconds hold - Supine Chin Tucks on Flat Ball  - 2-5 x daily - 7 x weekly - 1 sets - 10 reps - 5 seconds hold - Seated Isometric Cervical Retraction with Chin Tucks with Pillow behind head  - 2-5 x daily - 7 x weekly - 1 sets - 10 reps - 5 seconds hold - Circular Shoulder Pendulum with Table Support  - 1-2 x daily - 7 x weekly - 1-2 sets - 10 reps - 5 seconds hold - Flexion-Extension Shoulder Pendulum  with Table Support  - 1-2 x daily - 7 x weekly - 1-2 sets - 10 reps - Horizontal Shoulder Pendulum with Table Support  - 1-2 x daily - 7 x weekly - 1-2 sets - 10 reps - Supine Shoulder Flexion Extension AAROM with Dowel  - 1 x daily - 7  x weekly - 1-2 sets - 10 reps - 5 seconds hold - Supine Shoulder Abduction AAROM with Dowel  - 1 x daily - 7 x weekly - 1-2 sets - 10 reps - 5 seconds hold - Supine Shoulder External Rotation with Dowel  - 1 x daily - 7 x weekly - 1-2 sets - 10 reps - 5 seconds hold   Neuromuscular Reed-  cues on proprioception RLE using residual limb, balance & standing tolerance.   Do each exercise 1-2  times per day Do each exercise 5-10 repetitions Hold each exercise for 2 seconds to feel your location  AT [place].  Try to find this position when standing still for activities.   USE TAPE ON FLOOR TO MARK THE MIDLINE POSITION which is even with middle of sink.  You also should try to feel with your limb pressure in socket.  You are trying to feel with limb what you used to feel with the bottom of your foot.  Side to Side Shift: Moving your hips only (not shoulders): move weight onto your left leg, HOLD/FEEL pressure in socket.  Move back to equal weight on each leg, HOLD/FEEL pressure in socket. Move weight onto your right leg, HOLD/FEEL pressure in socket. Move back to equal weight on each leg, HOLD/FEEL pressure in socket. Repeat.  Start with both hands on sink, progress to hand on prosthetic side only, then no hands.  Front to Back Shift: Moving your hips only (not shoulders): move your weight forward onto your toes, HOLD/FEEL pressure in socket. Move your weight back to equal Flat Foot on both legs, HOLD/FEEL  pressure in socket. Move your weight back onto your heels, HOLD/FEEL  pressure in socket. Move your weight back to equal on both legs, HOLD/FEEL  pressure in socket. Repeat.  Start with both hands  on sink, progress to hand on prosthetic side only, then no hands.  Moving Cones / Cups: With equal weight on each leg: Hold on with one hand the first time, then progress to no hand supports. Move cups from one side of sink to the other. Place cups ~2" out of your reach, progress to 10" beyond reach.  Place one hand in middle of sink and reach with other hand. Do both arms.  Then hover one hand and move cups with other hand.  Overhead/Upward Reaching: alternated reaching up to top cabinets or ceiling if no cabinets present. Keep equal weight on each leg. Start with one hand support on counter while other hand reaches and progress to no hand support with reaching.  ace one hand in middle of sink and reach with other hand. Do both arms.  Then hover one hand and move cups with other hand.  5.   Looking Over Shoulders: With equal weight on each leg: alternate turning to look over your shoulders with one hand support on counter as needed.  Start with head motions only to look in front of shoulder, then even with shoulder and progress to looking behind you. To look to side, move head /eyes, then shoulder on side looking pulls back, shift more weight to side looking and pull hip back. Place one hand in middle of sink and let go with other hand so your shoulder can pull back. Switch hands to look other way.   Then hover one hand and look over shoulder. If looking right, use left hand at sink. If looking left, use  right hand at sink. 6.  Alternate Tapping Toes or Stepping to lower shelf of cabinet  Move items under cabinet out of your way. Shift your hips/pelvis so weight on stance leg & Tighten muscles in hip.  SLOWLY step other leg so front of foot is in cabinet. Then step back to floor. Repeat with other leg.     ASSESSMENT:   CLINICAL IMPRESSION: He presented with a new wound on the LLE to be monitored by NP currently addressing other wounds, and discussed concerns of shoe length inhibiting mobility on and off  walker. He and his wife were advised to search for shoes that could potentially be used by the prosthetist with a trimmed toe filler and they received written instruction of prosthetist's and PT's recommendations for the type and size to purchase. He navigated ramp and curb safely with the RW and no cueing for technique, appearing safe to do so in the community when not limited by fatigue and shortness of breath. He remains limited in static balance ability without UE support but continues practicing standing balance at home. He continues to benefit from skilled PT.  OBJECTIVE IMPAIRMENTS .Abnormal gait, cardiopulmonary status limiting activity, decreased activity tolerance, decreased balance, decreased endurance, decreased knowledge of use of DME, decreased mobility, decreased ROM, decreased strength, dizziness, increased edema, impaired flexibility, postural dysfunction, prosthetic dependency , obesity, and pain.    ACTIVITY LIMITATIONS carrying, lifting, bending, standing, stairs, transfers, locomotion level, UE use with ADLs, sleep    PARTICIPATION LIMITATIONS: driving, community activity, and yard work, UE use with ADLs &  household activities   [place], Time since onset of injury/illness/exacerbation, and 3+ comorbidities: see PMH  are also affecting patient's functional outcome.    REHAB POTENTIAL: Good   CLINICAL DECISION MAKING: Evolving/moderate complexity   EVALUATION COMPLEXITY: Moderate     GOALS: Goals reviewed with patient? Yes  SHORT TERM GOALS: Target date: 07/14/2022  1. Patient able to stand with no UE support for 1 min with supervision. Baseline: SEE OBJECTIVE DATA Goal status: ongoing 07/27/22   2. Patient independent with donning/doffing new AFO or prosthesis. Baseline: SEE OBJECTIVE DATA Goal status: MET 07/27/22  3. Patient demo proper sitting posture for left shoulder pain & function.  Goal status: MET 07/11/2022  4. Left shoulder pain reported  </= 7/10 Goal status: MET 07/11/2022   LONG TERM GOALS: Target date: 08/04/2022   Patient demonstrates & verbalized understanding of prosthetic care to enable safe utilization of prosthesis. Baseline: SEE OBJECTIVE DATA Goal status: ongoing 07/27/22    Patient tolerates right prosthesis & left AFO / partial foot prosthesis wear >90% of awake hours without skin or limb pain issues. Baseline: SEE OBJECTIVE DATA Goal status: ongoing 07/27/22    Berg Balance >/= 20/56 and tasks of Berg with RW support >/= 45/56 to indicate lower fall risk with standing ADLs Baseline: SEE OBJECTIVE DATA Goal status: ongoing 07/27/22    Patient ambulates >300' with prosthesis & LRAD modified independent Baseline: SEE OBJECTIVE DATA Goal status: ongoing 07/27/22    Patient negotiates ramps, curbs & stairs with single rail with prosthesis & LRAD modified independent. Baseline: SEE OBJECTIVE DATA Goal status: ongoing 07/27/22   6. Left shoulder pain </= 3/10 with functional activities.  Goal status: new 06/23/2022  7. Left shoulder AROM within 75% of Right shoulder  Goal status: new 06/23/2022  8.patient able to use LUE for ADLs, gait, transfers, etc. Safely  Goal status: new 06/23/2022     PLAN: PT FREQUENCY:  2x/week   PT DURATION: 13 weeks (90 days);   shoulder included on 06/23/2022 for 6 weeks.    PLANNED INTERVENTIONS: Therapeutic exercises, Therapeutic activity, Neuromuscular re-education, Balance training, Gait training, Patient/Family education, Self Care, Joint mobilization, Stair training, Vestibular training, Canalith repositioning, Prosthetic training, DME instructions, Dry Needling, Electrical stimulation, Cryotherapy, Moist heat, Taping, Vasopneumatic device, Ultrasound, Manual therapy, Re-evaluation, and physical performance testing    PLAN FOR NEXT SESSION: continue to monitor HR & SpO2 with activities, check LTGs over next 2 visits & do recertification, progress gait with RW increasing walking  tolerance with half of ramp & curb series to improve comfort with ramp and curb while reducing fatigue, continue static balance activity  Jana Hakim, Student-PT 07/27/2022, 8:51 AM  This entire session of physical therapy was performed under the direct supervision of PT signing evaluation /treatment. PT reviewed note and agrees.  Jamey Reas, PT, DPT 07/27/2022, 12:56 PM

## 2022-07-27 ENCOUNTER — Ambulatory Visit (INDEPENDENT_AMBULATORY_CARE_PROVIDER_SITE_OTHER): Payer: Medicare Other | Admitting: Physical Therapy

## 2022-07-27 DIAGNOSIS — M25612 Stiffness of left shoulder, not elsewhere classified: Secondary | ICD-10-CM

## 2022-07-27 DIAGNOSIS — R293 Abnormal posture: Secondary | ICD-10-CM

## 2022-07-27 DIAGNOSIS — R2681 Unsteadiness on feet: Secondary | ICD-10-CM | POA: Diagnosis not present

## 2022-07-27 DIAGNOSIS — R600 Localized edema: Secondary | ICD-10-CM

## 2022-07-27 DIAGNOSIS — M25512 Pain in left shoulder: Secondary | ICD-10-CM

## 2022-07-27 DIAGNOSIS — M6281 Muscle weakness (generalized): Secondary | ICD-10-CM

## 2022-07-27 DIAGNOSIS — R2689 Other abnormalities of gait and mobility: Secondary | ICD-10-CM | POA: Diagnosis not present

## 2022-08-01 ENCOUNTER — Ambulatory Visit (INDEPENDENT_AMBULATORY_CARE_PROVIDER_SITE_OTHER): Payer: Medicare Other | Admitting: Physical Therapy

## 2022-08-01 ENCOUNTER — Encounter: Payer: Self-pay | Admitting: Physical Therapy

## 2022-08-01 DIAGNOSIS — R2689 Other abnormalities of gait and mobility: Secondary | ICD-10-CM

## 2022-08-01 DIAGNOSIS — R600 Localized edema: Secondary | ICD-10-CM

## 2022-08-01 DIAGNOSIS — R2681 Unsteadiness on feet: Secondary | ICD-10-CM

## 2022-08-01 DIAGNOSIS — M25512 Pain in left shoulder: Secondary | ICD-10-CM

## 2022-08-01 DIAGNOSIS — M25612 Stiffness of left shoulder, not elsewhere classified: Secondary | ICD-10-CM

## 2022-08-01 DIAGNOSIS — M6281 Muscle weakness (generalized): Secondary | ICD-10-CM

## 2022-08-01 DIAGNOSIS — R293 Abnormal posture: Secondary | ICD-10-CM

## 2022-08-01 NOTE — Therapy (Signed)
OUTPATIENT PHYSICAL THERAPY TREATMENT NOTE & DISCHARGE SUMMARY   Patient Name: Shane [name] MRN: [id number] DOB:08-16-1941, 81 y.o., male Today's Date: 08/01/2022  PCP: Burnard Bunting, MD REFERRING PROVIDER: [name], MD for TTA & [name], NP for left shoulder  PHYSICAL THERAPY DISCHARGE SUMMARY FOR SHOULDER ONLY  Visits from Start of Care: 11 (06/23/22 to 08/01/22)  Current functional level related to goals / functional outcomes: See note   Remaining deficits: See note   Education / Equipment: Pt instructed in HEP and appears independent.   Patient agrees to discharge from 7/19 referral for Lt shoulder pain. Patient goals were met. Patient is being discharged due to meeting the stated rehab goals. He will continue attending PT for 6/5 referral to address functional limitations following TTA and TMA.     END OF SESSION:   PT End of Session - 08/01/22 0839     Visit Number 22    Number of Visits 26    Date for PT Re-Evaluation 08/04/22    Authorization Type [employer] Medicare    Authorization Time Period [facility] MEDICARE  $20 COPAY    Progress Note Due on Visit 30    PT Start Time 0801    PT Stop Time 0844    PT Time Calculation (min) 43 min    Equipment Utilized During Treatment Gait belt    Activity Tolerance Patient tolerated treatment well    Behavior During Therapy WFL for tasks assessed/performed             Past Medical History:  Diagnosis Date   Anxiety    AV block, Mobitz 1    Cataract    Chronic kidney disease    d/t DM   CKD (chronic kidney disease), stage III ([place])    Depression    Diabetes mellitus    Vgo disposal insulin bolus  simular to insulin pump   Dyspnea    GERD (gastroesophageal reflux disease)    History of kidney stones    passed   Hyperlipidemia    Hypertension    Idiopathic pulmonary fibrosis ([place]) 11/2016   ILD (interstitial lung disease) ([place])    Moderate aortic stenosis    a. 10/2019 Echo: EF 55-60%, Gr2 DD.  Nl RV.    Neuromuscular disorder ([place])    Neuropathy associated with endocrine disorder ([place])    Nonobstructive CAD (coronary artery disease)    a. 2012 Cath: mod, nonobs dzs; b. 10/2016 MV: EF 60%, no ischemia.   OSA on CPAP 05/05/2017   Unattended Home Sleep Test 7/2/813-AHI 38.6/hour, desaturation to 64%, body weight 261 pounds   PONV (postoperative nausea and vomiting)    Sleep apnea     uses cpap asked to bring mask and tubing   Past Surgical History:  Procedure Laterality Date   ABDOMINAL AORTOGRAM W/LOWER EXTREMITY N/A 12/10/2020   Procedure: ABDOMINAL AORTOGRAM W/LOWER EXTREMITY;  Surgeon: Cherre Robins, MD;  Location: [place] [facility];  Service: Cardiovascular;  Laterality: N/A;   AMPUTATION Right 01/22/2021   Procedure: 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;  Surgeon: [name], MD;  Location: [place];  Service: Orthopedics;  Laterality: Right;   AMPUTATION Right 03/17/2021   Procedure: RIGHT BELOW KNEE AMPUTATION;  Surgeon: [name], MD;  Location: [place];  Service: Orthopedics;  Laterality: Right;   ANKLE FUSION Right 01/22/2021   Procedure: RIGHT FOOT TIBIOCALCANEAL FUSION;  Surgeon: [name], MD;  Location: [place];  Service: Orthopedics;  Laterality: Right;  ANTERIOR FUSION CERVICAL SPINE  2012   CARDIAC CATHETERIZATION  2011   CARDIAC CATHETERIZATION N/A 11/09/2016   Procedure: Right Heart Cath;  Surgeon: Belva Crome, MD;  Location: [place] [facility];  Service: Cardiovascular;  Laterality: N/A;   carpel tunnel     left wrist   CATARACT EXTRACTION     CATARACT EXTRACTION W/ INTRAOCULAR LENS  IMPLANT, BILATERAL  2013   CERVICAL LAMINECTOMY  2012   COLONOSCOPY N/A 01/14/2013   Procedure: COLONOSCOPY;  Surgeon: Irene Shipper, MD;  Location: [facility];  Service: Endoscopy;  Laterality: N/A;   CORONARY ARTERY BYPASS GRAFT  11/04/2020   LIMA-LAD, SVG-OM1, SVG-PDA (Dr [facility]) dc 11/18/2020   EYE SURGERY     I & D EXTREMITY Right 02/19/2021   Procedure: RIGHT ANKLE  DEBRIDEMENT AND PLACEMENT ANTIBIOTIC BEADS;  Surgeon: [name], MD;  Location: [place];  Service: Orthopedics;  Laterality: Right;   KNEE SURGERY  1998   left   LEFT HEART CATH AND CORONARY ANGIOGRAPHY N/A 07/10/2020   Procedure: LEFT HEART CATH AND CORONARY ANGIOGRAPHY;  Surgeon: Sherren Mocha, MD;  Location: [place] [facility];  Service: Cardiovascular;  Laterality: N/A;   LUMBAR LAMINECTOMY  2003   LUNG BIOPSY Left 12/26/2016   Procedure: LUNG BIOPSY;  Surgeon: [name], MD;  Location: [place];  Service: Thoracic;  Laterality: Left;   PACEMAKER IMPLANT N/A 03/30/2020   Procedure: PACEMAKER IMPLANT;  Surgeon: Evans Lance, MD;  Location: [place] [facility];  Service: Cardiovascular;  Laterality: N/A;   PERIPHERAL VASCULAR INTERVENTION Right 12/10/2020   Procedure: PERIPHERAL VASCULAR INTERVENTION;  Surgeon: Cherre Robins, MD;  Location: [place] [facility];  Service: Cardiovascular;  Laterality: Right;  SFA   POSTERIOR FUSION CERVICAL SPINE  2012   PPM GENERATOR REMOVAL N/A 12/14/2020   Procedure: PPM GENERATOR REMOVAL;  Surgeon: Evans Lance, MD;  Location: [place] [facility];  Service: Cardiovascular;  Laterality: N/A;   TEE WITHOUT CARDIOVERSION N/A 12/11/2020   Procedure: TRANSESOPHAGEAL ECHOCARDIOGRAM (TEE);  Surgeon: Geralynn Rile, MD;  Location: [place];  Service: Cardiovascular;  Laterality: N/A;   TRIGGER FINGER RELEASE  2011   4th finger left hand   VIDEO ASSISTED THORACOSCOPY Left 12/26/2016   Procedure: VIDEO ASSISTED THORACOSCOPY;  Surgeon: [name], MD;  Location: [place];  Service: Thoracic;  Laterality: Left;   VIDEO BRONCHOSCOPY N/A 12/26/2016   Procedure: VIDEO BRONCHOSCOPY;  Surgeon: [name], MD;  Location: [place];  Service: Thoracic;  Laterality: N/A;   Patient Active Problem List   Diagnosis Date Noted   Intermediate stage nonexudative age-related macular degeneration of both eyes 04/25/2022   Research study patient  12/24/2021   Post-operative pain    CKD (chronic kidney disease), stage II    Labile blood glucose    Pulmonary fibrosis ([place])    Hyponatremia    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[place]) 03/26/2021   S/P BKA (below knee amputation) unilateral, right ([place])    Shortness of breath    Acute blood loss anemia    Stage 3 chronic kidney disease ([place])    Atrial fibrillation ([place])    Controlled type 2 diabetes mellitus with hyperglycemia, with long-term current use of insulin (H[facility])    Postoperative pain    Postoperative hemorrhagic shock 03/20/2021   Atherosclerosis of native arteries of extremities with gangrene, right leg ([place])    Hardware complicating wound infection ([place])    Abscess of bursa of  right ankle 02/15/2021   Endocarditis 01/19/2021   Peripheral vascular disease ([place]) 12/29/2020   CHF (congestive heart failure), NYHA class II, chronic, diastolic ([place]) 94/85/4627   History of COVID-19 12/22/2020   SSS (sick sinus syndrome) ([place]) 12/22/2020   Acute bacterial endocarditis    MSSA bacteremia 12/09/2020   Diabetic foot ulcer ([place]) 12/09/2020   Foot drop, right foot    Generalized weakness 12/08/2020   Dehydration with hyponatremia 12/08/2020   Atrial fibrillation, chronic (HCC) 12/08/2020   Elevated troponin level not due myocardial infarction 12/08/2020   Adrenal insufficiency ([place]) 11/14/2020   Orthostatic hypotension 11/14/2020   Physical deconditioning 11/14/2020   Difficulty sleeping 11/07/2020   Postoperative anemia due to acute blood loss 11/07/2020   Right ventricular dysfunction 11/05/2020   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/04/2020   Hearing loss 11/03/2020   Cardiac device in situ 09/09/2020   Coronary atherosclerosis due to lipid rich plaque 09/02/2020   COVID-19 virus infection 07/18/2020   Coronary artery disease involving native heart without angina pectoris 07/10/2020   Chronic kidney disease, stage 3a ([place]) 03/29/2020   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/28/2020   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 without macular edema ([place]) 03/16/2020   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 ([place]) 03/16/2020   Right epiretinal membrane 03/16/2020   Posterior vitreous detachment of right eye 03/16/2020   Aortic stenosis, moderate 03/12/2020   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   Near syncope 04/27/2018   Hypoglycemia due to insulin 01/06/2018   Essential hypertension 01/06/2018   Diabetes mellitus type 2, with complication, on long term insulin pump ([place]) 01/06/2018   Syncope and collapse 01/05/2018   Encounter for therapeutic drug monitoring 06/29/2017   OSA on CPAP 05/05/2017   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[place]) 01/05/2017   Abnormal chest x-ray 10/12/2016   Benign neoplasm of colon 01/14/2013    REFERRING DIAG: Z89.511 (ICD-10-CM) - Hx of right BKA  06/23/2022 left shoulder strain  ONSET DATE: 05/04/2022 MD visit with release to WB on LLE  THERAPY DIAG:  Unsteadiness on feet  Other abnormalities of gait and mobility  Muscle weakness (generalized)  Abnormal posture  Acute pain of left shoulder  Stiffness of left shoulder, not elsewhere classified  Localized edema  Rationale for Evaluation and Treatment Rehabilitation  PERTINENT HISTORY: CKD st III, AV block Mobitz, pacemaker, pulmonary fibrosis, CAD, CABG 21, HTN, A-Fib, DM2, PAD, right BKA, Left TMA  PRECAUTIONS: ICD/Pacemaker - continue monitoring vitals with activity and calculated HRmax is 151 bpm  SUBJECTIVE: They didn't see the NP last week as she was out and the visit was rescheduled for this Thursday, and they received the advice to continue their current wound management practice. He says the new wound on Lt foot is coming along a bit. They found the exact same shoe in a smaller size and may purchase it but are unsure if it will make a large difference as only 1/3" shorter for 1 size change.  Shoulder continues doing well with no pain with activities.  PAIN:    Are you having pain? Yes: NPRS scale: today 0/10, over last week lowest 0/10  Pain location: left shoulder lateral just distal to joint Pain description: tingling when shoulder in improper posture. Aggravating factors: improper posture. Relieving factors: correcting posture   OBJECTIVE: (objective measures completed at initial evaluation unless otherwise dated)  COGNITION: Overall cognitive status: Within functional limits for tasks assessed  POSTURE: 05/09/2022:  rounded shoulders, forward head, and flexed trunk    LOWER EXTREMITY ROM:    UPPER EXTREMITY ROM:  ROM P:passive  A:active Left 06/23/22 Supine all limited by pain Left 08/01/22 Seated All painfree Right 08/01/22 Seated  Shoulder flexion 30* A: 148 A: 153  Shoulder extension 0* neutral A: 72 A: 74  Shoulder abduction 45* A: 135 A: 141  Shoulder adduction     Shoulder internal rotation 20* A: 84 A: 89  Shoulder external rotation 10* A: 88 A: 83  Elbow flexion     Elbow extension -15*    Wrist flexion     Wrist extension     Wrist ulnar deviation     Wrist radial deviation     Wrist pronation     Wrist supination      (Blank rows = not tested)    ROM P:passive  A:active Right Eval 05/09/2022 Left Eval 05/09/2022  Hip flexion      Hip extension A: -25* standing A: -25* standing  Hip abduction      Hip adduction      Hip internal rotation      Hip external rotation      Knee flexion      Knee extension      Ankle dorsiflexion NA P: -17*  Ankle plantarflexion NA    Ankle inversion NA    Ankle eversion NA     (Blank rows = not tested)   LOWER EXTREMITY MMT:   MMT Right Eval 05/09/2022 Left Eval 05/09/2022  Hip flexion      Hip extension Functional testing 3-/5 Functional testing 3-/5  Hip abduction Functional testing 3-/5 Functional testing 3-/5  Hip adduction      Hip internal rotation      Hip external rotation      Knee  flexion Functional testing 3-/5 Functional testing 3-/5  Knee extension Functional testing 3/5 Functional testing 3/5  Ankle dorsiflexion      Ankle plantarflexion      Ankle inversion      Ankle eversion      (Blank rows = not tested)   POSTURE:  06/23/2022  seated - head forward, rounded shoulders and posterior pelvic tilt  TRANSFERS: 05/09/2022:  Sit to stand: SBA and requires use of armrests from 20" stable w/c & needs RW for stabilization 05/09/2022:  Stand to sit: SBA and requires use of armrests from 20" stable w/c & needs RW for stabilization   GAIT: 05/09/2022:  Gait pattern: step to pattern, decreased step length- Left, decreased stance time- Right, Right hip hike, antalgic, lateral hip instability, trunk flexed, and wide BOS Distance walked: 25' Assistive device utilized: Environmental consultant - 2 wheeled and TTA prosthesis RLE Level of assistance: Min A Comments: pt reports dizziness with turning in gait   UE FUNCTION: 06/23/2022:  Pt reports that he uses RUE for fine motor like writing but uses both hands for gross motor or sports.  Seated - pt unable to raise LUE in flexion or abd due to pain.  Supine - pt unable to move LUE in flexion or abd due to pain.  PROM flexion > pain than abd   Pt tolerates elbow ext with palm supinated but with palm neutral causes shoulder pain. Also painful with elbow already extended and pronate palm.   PT simulated pushing on RW thru LUE with manual resistance.  Pt reports slight increase in pain but not as sharp as above function.    FUNCTIONAL  TESTs:  Berg Balance Scale: 05/09/2022:  9/56 & tasks of Berg with RW support 27/56    [facility] PT Assessment - 05/09/22 0930                Berg Balance Test    Sit to Stand Needs moderate or maximal assist to stand   task with RW support = 3    Standing Unsupported Unable to stand 30 seconds unassisted   task with RW support = 3    Sitting with Back Unsupported but Feet Supported on Floor or Stool Able to sit safely and  securely 2 minutes     Stand to Sit Uses backs of legs against chair to control descent   task with RW support = 3    Transfers Able to transfer safely, definite need of hands     Standing Unsupported with Eyes Closed Needs help to keep from falling   task with RW support = 3    Standing Unsupported with Feet Together Needs help to attain position and unable to hold for 15 seconds   task with RW support = 2    From Standing, Reach Forward with Outstretched Arm Loses balance while trying/requires external support   task with RW support = 1    From Standing Position, Pick up Object from Floor Unable to try/needs assist to keep balance   task with RW support = 2    From Standing Position, Turn to Look Behind Over each Shoulder Needs assist to keep from losing balance and falling   task with RW support = 1    Turn 360 Degrees Needs assistance while turning   task with RW support = 0    Standing Unsupported, Alternately Place Feet on Step/Stool Needs assistance to keep from falling or unable to try   task with RW support = 0    Standing Unsupported, One Foot in [employer] balance while stepping or standing   task with RW support = 1    Standing on One Leg Unable to try or needs assist to prevent fall   task with RW support = 1    Total Score 9     Berg comment: tasks of Berg with RW support 27/56                     CURRENT PROSTHETIC WEAR ASSESSMENT: 05/09/2022:   Patient is dependent with: skin check, residual limb care, care of non-amputated limb, prosthetic cleaning, ply sock cleaning, and correct ply sock adjustment Donning prosthesis: Modified independence Doffing prosthesis: Modified independence Prosthetic wear tolerance: >90% of awake hours/day, 7 days/week Prosthetic weight bearing tolerance: 5 minutes Edema: LLE pitting edema with >10 sec capillary refill Residual limb condition: Right BKA limb has no openings, normal temperature & moisture.  Redness patella & patella tendon from  friction of liner with knee flexed. Cylinderical shape.  LLE TMA pt's wife showed picture of wound on dorsum of foot that is being managed by PCP.   Prosthetic description: silicon liner with pin lock suspension, total contact socket, dynamic response foot.      TODAY'S TREATMENT:  08/01/2022 TherEx: Shoulder AROM in seated without back support as noted in objective  Prosthetic Training with right TTA prosthesis & left dynamic AFO and custom shoe with toe filler and carbon plate: PT educated that right prosthesis & left forefoot do not have proprioception in non-weight bearing situations so he has to visually watch clearance like on his scooter. Pt &  wife verbalized understanding.  Pt ambulate 10' with RW and SBA with visual target for foot placement including ascend curb and descend ramp SBA with verbal and visual cueing for foot placement when moving walker over curb, then ambulate 67' with ascend ramp and descend curb with same assist level and cueing Vitals following first walk, ramp, and curb: SpO2 88% HR 122 bpm PT discuss plan of care and assistive device progression, increasing walking tolerance outside of PT appointments with ramp and curb, pt and wife verbalized understanding  07/27/2022 Prosthetic Training with right TTA prosthesis & left dynamic AFO and custom shoe with toe filler and carbon plate: PT doff AFO for inspection of Lt foot, wound remained covered and pt and wife were instructed to show NP at appointment tomorrow Pt don AFO with simulated reacher, no verbal corrections required Pt ambulate 25' with RW and SBA with visual target for foot placement and verbal cueing to reduce Lt foot crossover Pt ascend and descend curb and ramp x1 with RW with CGA to SBA, pt required no cueing and appears safe to navigate ramp and curb with RW Vitals following walk, ramp, and curb: SpO2 90% HR 123 bpm PT discuss shoe clearance concern with prosthetist and instruct pt and wife to search for  shoes size 10.5 or 11 extra deep & wide within their out-of-pocket budget to discuss fit with prosthetist, they both verbalized understanding and size instructions were written for them  Neuro Re-ed: Static stance x 30 sec, multiple instances of UE touch for support Modified tandem stance x 30 sec bil with full step forward, multiple instances of UE touch for support Addition of modified tandem stance to static balance at home with reminder of set up for safety, pt verbalized understanding  07/25/2022 Vitals -following ramp ascend and descend and 25' amb RW: SpO2 91%, HR 126 bpm -following 2 min seated rest: SpO2 96%, HR 99 bpm -following 110' amb RW: SpO2 95%, HR 108 bpm  Prosthetic Training with right TTA prosthesis & left dynamic AFO and custom shoe with toe filler and carbon plate: Pt demo donning AFO with simulated reacher, required correction of shin plate placement and ankle strap twisting with cues on potential skin irritation PT education on sweat management on residual limb and use of mineral oil to manage friction, pt verbalized understanding Sit to/from stand chair without armrests x 2 with minA for ascent and descent Pt amb 25' x 2 and 110' with RW and SBA with visual target for foot placement, PT demo supervision with w/c follow for wife and she verbalized understanding Pt ascend and descend curb x 1 ea. LE with RW with CGA following PT demo and verbal explanation of step and walker sequence, showed decreased eccentric control with RLE descent Pt ascend and descend ramp x1 with RW with CGA at trunk and minA to control walker during ramp descent, following PT demo and verbal explanation of weight bearing, step and walker sequencing  Self Care: PT education on signs of dehydration and water intake suggestions, pt verbalized understanding    HOME EXERCISE PROGRAM  Access Code: [email or web address] URL: [email or web address] Date: 07/05/2022 Prepared by: Jamey Reas  Exercises - Seated Correct Posture  - 1 x daily - 7 x weekly - Seated Scapular Retraction  - 2-5 x daily - 7 x weekly - 1 sets - 10 reps - 5 seconds hold - Supine Chin Tucks on Flat Ball  - 2-5 x daily - 7 x weekly - 1 sets - 10  reps - 5 seconds hold - Seated Isometric Cervical Retraction with Chin Tucks with Pillow behind head  - 2-5 x daily - 7 x weekly - 1 sets - 10 reps - 5 seconds hold - Circular Shoulder Pendulum with Table Support  - 1-2 x daily - 7 x weekly - 1-2 sets - 10 reps - 5 seconds hold - Flexion-Extension Shoulder Pendulum with Table Support  - 1-2 x daily - 7 x weekly - 1-2 sets - 10 reps - Horizontal Shoulder Pendulum with Table Support  - 1-2 x daily - 7 x weekly - 1-2 sets - 10 reps - Supine Shoulder Flexion Extension AAROM with Dowel  - 1 x daily - 7 x weekly - 1-2 sets - 10 reps - 5 seconds hold - Supine Shoulder Abduction AAROM with Dowel  - 1 x daily - 7 x weekly - 1-2 sets - 10 reps - 5 seconds hold - Supine Shoulder External Rotation with Dowel  - 1 x daily - 7 x weekly - 1-2 sets - 10 reps - 5 seconds hold   Neuromuscular Reed-  cues on proprioception RLE using residual limb, balance & standing tolerance.   Do each exercise 1-2  times per day Do each exercise 5-10 repetitions Hold each exercise for 2 seconds to feel your location  AT [place].  Try to find this position when standing still for activities.   USE TAPE ON FLOOR TO MARK THE MIDLINE POSITION which is even with middle of sink.  You also should try to feel with your limb pressure in socket.  You are trying to feel with limb what you used to feel with the bottom of your foot.  Side to Side Shift: Moving your hips only (not shoulders): move weight onto your left leg, HOLD/FEEL pressure in socket.  Move back to equal weight on each leg, HOLD/FEEL pressure in socket. Move weight onto your right leg, HOLD/FEEL pressure in socket.  Move back to equal weight on each leg, HOLD/FEEL pressure in socket. Repeat.  Start with both hands on sink, progress to hand on prosthetic side only, then no hands.  Front to Back Shift: Moving your hips only (not shoulders): move your weight forward onto your toes, HOLD/FEEL pressure in socket. Move your weight back to equal Flat Foot on both legs, HOLD/FEEL  pressure in socket. Move your weight back onto your heels, HOLD/FEEL  pressure in socket. Move your weight back to equal on both legs, HOLD/FEEL  pressure in socket. Repeat.  Start with both hands on sink, progress to hand on prosthetic side only, then no hands.  Moving Cones / Cups: With equal weight on each leg: Hold on with one hand the first time, then progress to no hand supports. Move cups from one side of sink to the other. Place cups ~2" out of your reach, progress to 10" beyond reach.  Place one hand in middle of sink and reach with other hand. Do both arms.  Then hover one hand and move cups with other hand.  Overhead/Upward Reaching: alternated reaching up to top cabinets or ceiling if no cabinets present. Keep equal weight on each leg. Start with one hand support on counter while other hand reaches and progress to no hand support with reaching.  ace one hand in middle of sink and reach with other hand. Do both arms.  Then hover one hand and move cups with other hand.  5.   Looking Over Shoulders: With equal weight on each leg: alternate turning to look over your shoulders with one hand support on counter as needed.  Start with head motions only to look in front of shoulder, then even with shoulder and progress to looking behind you. To look to side, move head /eyes, then shoulder on side looking pulls back, shift more weight to side looking and pull hip back. Place one hand in middle of sink and let go with other hand so your shoulder can pull back. Switch hands to look other way.   Then hover one hand and look over shoulder. If looking right,  use left hand at sink. If looking left, use right hand at sink. 6.  Alternate Tapping Toes or Stepping to lower shelf of cabinet  Move items under cabinet out of your way. Shift your hips/pelvis so weight on stance leg & Tighten muscles in hip.  SLOWLY step other leg so front of foot is in cabinet. Then step back to floor. Repeat with other leg.     ASSESSMENT:   CLINICAL IMPRESSION: He will be discharged from the [facility] for the 7/19 referral to address Lt shoulder pain and RTC strain due to meeting all goals related to shoulder (including pain, range of motion, and functional use). He will continue the current POC for the 6/5 referral addressing functional limitations following the TTA and TMA. He tolerated increased ambulation distance with the RW and vitals remained stable with activity. Next appointment will include a re-certification due to ongoing goals related to prosthesis and orthosis management, balance impairments, and functional capacity. He continues to benefit from skilled PT.  OBJECTIVE IMPAIRMENTS .Abnormal gait, cardiopulmonary status limiting activity, decreased activity tolerance, decreased balance, decreased endurance, decreased knowledge of use of DME, decreased mobility, decreased ROM, decreased strength, dizziness, increased edema, impaired flexibility, postural dysfunction, prosthetic dependency , obesity, and pain.    ACTIVITY LIMITATIONS carrying, lifting, bending, standing, stairs, transfers, locomotion level, UE use with ADLs, sleep    PARTICIPATION LIMITATIONS: driving, community activity, and yard work, UE use with ADLs &  household activities   [place], Time since onset of injury/illness/exacerbation, and 3+ comorbidities: see PMH  are also affecting patient's functional outcome.    REHAB POTENTIAL: Good   CLINICAL DECISION MAKING: Evolving/moderate complexity   EVALUATION COMPLEXITY: Moderate     GOALS: Goals reviewed with patient? Yes  SHORT  TERM GOALS: Target date: 07/14/2022  1. Patient able to stand with no UE support for 1 min with supervision. Baseline: SEE OBJECTIVE DATA Goal status: ongoing 07/27/22   2. Patient independent with donning/doffing new AFO or prosthesis. Baseline: SEE OBJECTIVE DATA Goal status: MET 07/27/22  3. Patient demo proper sitting posture for left shoulder pain & function.  Goal status: MET 07/11/2022  4. Left shoulder pain reported </= 7/10 Goal status: MET 07/11/2022   LONG TERM GOALS: Target date: 08/04/2022   Patient demonstrates & verbalized understanding of prosthetic care to enable safe utilization of prosthesis. Baseline: SEE OBJECTIVE DATA Goal status: ongoing 07/27/22    Patient tolerates right prosthesis & left AFO / partial foot prosthesis wear >90% of awake hours without skin or limb pain issues. Baseline: SEE OBJECTIVE DATA Goal status: ongoing 07/27/22    Berg Balance >/= 20/56 and tasks of Berg with RW support >/= 45/56 to indicate lower fall risk with standing ADLs Baseline: SEE OBJECTIVE DATA Goal status: ongoing 07/27/22    Patient ambulates >300' with prosthesis & LRAD modified  independent Baseline: SEE OBJECTIVE DATA Goal status: ongoing 07/27/22    Patient negotiates ramps, curbs & stairs with single rail with prosthesis & LRAD modified independent. Baseline: SEE OBJECTIVE DATA Goal status: ongoing 07/27/22   6. Left shoulder pain </= 3/10 with functional activities.  Goal status: met 08/01/22  7. Left shoulder AROM within 75% of Right shoulder  Goal status: met 08/01/22  8.patient able to use LUE for ADLs, gait, transfers, etc. Safely  Goal status: met 08/01/22     PLAN: PT FREQUENCY: 2x/week   PT DURATION: 13 weeks (90 days);   shoulder included on 06/23/2022 for 6 weeks.    PLANNED INTERVENTIONS: Therapeutic exercises, Therapeutic activity, Neuromuscular re-education, Balance training, Gait training, Patient/Family education, Self Care, Joint mobilization, Stair  training, Vestibular training, Canalith repositioning, Prosthetic training, DME instructions, Dry Needling, Electrical stimulation, Cryotherapy, Moist heat, Taping, Vasopneumatic device, Ultrasound, Manual therapy, Re-evaluation, and physical performance testing    PLAN FOR NEXT SESSION: continue to monitor HR & SpO2 with activities, check LTGs including Berg & do recertification to Dr. Sharol Given, progress gait with RW increasing walking tolerance   Jana Hakim, Student-PT 08/01/2022, 12:42 PM  This entire session of physical therapy was performed under the direct supervision of PT signing evaluation /treatment. PT reviewed note and agrees.  Jamey Reas, PT, DPT 08/01/2022, 12:56 PM

## 2022-08-03 ENCOUNTER — Encounter: Payer: Self-pay | Admitting: Physical Therapy

## 2022-08-03 ENCOUNTER — Ambulatory Visit (INDEPENDENT_AMBULATORY_CARE_PROVIDER_SITE_OTHER): Payer: Medicare Other | Admitting: Physical Therapy

## 2022-08-03 DIAGNOSIS — M6281 Muscle weakness (generalized): Secondary | ICD-10-CM | POA: Diagnosis not present

## 2022-08-03 DIAGNOSIS — R2689 Other abnormalities of gait and mobility: Secondary | ICD-10-CM | POA: Diagnosis not present

## 2022-08-03 DIAGNOSIS — R293 Abnormal posture: Secondary | ICD-10-CM | POA: Diagnosis not present

## 2022-08-03 DIAGNOSIS — R2681 Unsteadiness on feet: Secondary | ICD-10-CM

## 2022-08-03 DIAGNOSIS — Z9181 History of falling: Secondary | ICD-10-CM

## 2022-08-03 NOTE — Therapy (Signed)
OUTPATIENT PHYSICAL THERAPY TREATMENT NOTE, MEDICARE PROGRESS NOTE & Recertification   Patient Name: Shane [name] MRN: [id number] DOB:1941-04-03, 81 y.o., male Today's Date: 08/03/2022  PCP: Burnard Bunting, MD REFERRING PROVIDER: [name], MD for TTA  Progress Note Reporting Period 07/25/2022 to 08/03/2022  See note below for Objective Data and Assessment of Progress/Goals.      END OF SESSION:   PT End of Session - 08/03/22 0801     Visit Number 23    Number of Visits 14    Date for PT Re-Evaluation 10/26/22    Authorization Type UHC Medicare    Authorization Time Period UHC MEDICARE  $20 COPAY    Progress Note Due on Visit 83    PT Start Time 0800    PT Stop Time 0843    PT Time Calculation (min) 43 min    Equipment Utilized During Treatment Gait belt    Activity Tolerance Patient tolerated treatment well;Patient limited by fatigue    Behavior During Therapy WFL for tasks assessed/performed              Past Medical History:  Diagnosis Date   Anxiety    AV block, Mobitz 1    Cataract    Chronic kidney disease    d/t DM   CKD (chronic kidney disease), stage III ([place])    Depression    Diabetes mellitus    Vgo disposal insulin bolus  simular to insulin pump   Dyspnea    GERD (gastroesophageal reflux disease)    History of kidney stones    passed   Hyperlipidemia    Hypertension    Idiopathic pulmonary fibrosis ([place]) 11/2016   ILD (interstitial lung disease) ([place])    Moderate aortic stenosis    a. 10/2019 Echo: EF 55-60%, Gr2 DD. Nl RV.    Neuromuscular disorder ([place])    Neuropathy associated with endocrine disorder ([place])    Nonobstructive CAD (coronary artery disease)    a. 2012 Cath: mod, nonobs dzs; b. 10/2016 MV: EF 60%, no ischemia.   OSA on CPAP 05/05/2017   Unattended Home Sleep Test 7/2/813-AHI 38.6/hour, desaturation to 64%, body weight 261 pounds   PONV (postoperative nausea and vomiting)    Sleep apnea     uses cpap asked to bring  mask and tubing   Past Surgical History:  Procedure Laterality Date   ABDOMINAL AORTOGRAM W/LOWER EXTREMITY N/A 12/10/2020   Procedure: ABDOMINAL AORTOGRAM W/LOWER EXTREMITY;  Surgeon: Cherre Robins, MD;  Location: [facility];  Service: Cardiovascular;  Laterality: N/A;   AMPUTATION Right 01/22/2021   Procedure: 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;  Surgeon: [name], MD;  Location: [facility];  Service: Orthopedics;  Laterality: Right;   AMPUTATION Right 03/17/2021   Procedure: RIGHT BELOW KNEE AMPUTATION;  Surgeon: [name], MD;  Location: [facility];  Service: Orthopedics;  Laterality: Right;   ANKLE FUSION Right 01/22/2021   Procedure: RIGHT FOOT TIBIOCALCANEAL FUSION;  Surgeon: [name], MD;  Location: [facility];  Service: Orthopedics;  Laterality: Right;   ANTERIOR FUSION CERVICAL SPINE  2012   CARDIAC CATHETERIZATION  2011   CARDIAC CATHETERIZATION N/A 11/09/2016   Procedure: Right Heart Cath;  Surgeon: Belva Crome, MD;  Location: [facility];  Service: Cardiovascular;  Laterality: N/A;   carpel tunnel     left wrist   CATARACT EXTRACTION     CATARACT EXTRACTION W/ INTRAOCULAR LENS  IMPLANT, BILATERAL  2013   CERVICAL  LAMINECTOMY  2012   COLONOSCOPY N/A 01/14/2013   Procedure: COLONOSCOPY;  Surgeon: Irene Shipper, MD;  Location: [facility];  Service: Endoscopy;  Laterality: N/A;   CORONARY ARTERY BYPASS GRAFT  11/04/2020   LIMA-LAD, SVG-OM1, SVG-PDA (Dr Marney [facility] [place]) dc 11/18/2020   EYE SURGERY     I & D EXTREMITY Right 02/19/2021   Procedure: RIGHT ANKLE DEBRIDEMENT AND PLACEMENT ANTIBIOTIC BEADS;  Surgeon: [name], MD;  Location: [facility];  Service: Orthopedics;  Laterality: Right;   KNEE SURGERY  1998   left   LEFT HEART CATH AND CORONARY ANGIOGRAPHY N/A 07/10/2020   Procedure: LEFT HEART CATH AND CORONARY ANGIOGRAPHY;  Surgeon: Sherren Mocha, MD;  Location: [facility];  Service: Cardiovascular;  Laterality: N/A;   LUMBAR LAMINECTOMY  2003   LUNG BIOPSY  Left 12/26/2016   Procedure: LUNG BIOPSY;  Surgeon: [name], MD;  Location: [facility];  Service: Thoracic;  Laterality: Left;   PACEMAKER IMPLANT N/A 03/30/2020   Procedure: PACEMAKER IMPLANT;  Surgeon: Evans Lance, MD;  Location: [facility];  Service: Cardiovascular;  Laterality: N/A;   PERIPHERAL VASCULAR INTERVENTION Right 12/10/2020   Procedure: PERIPHERAL VASCULAR INTERVENTION;  Surgeon: Cherre Robins, MD;  Location: [facility];  Service: Cardiovascular;  Laterality: Right;  SFA   POSTERIOR FUSION CERVICAL SPINE  2012   PPM GENERATOR REMOVAL N/A 12/14/2020   Procedure: PPM GENERATOR REMOVAL;  Surgeon: Evans Lance, MD;  Location: [facility];  Service: Cardiovascular;  Laterality: N/A;   TEE WITHOUT CARDIOVERSION N/A 12/11/2020   Procedure: TRANSESOPHAGEAL ECHOCARDIOGRAM (TEE);  Surgeon: Geralynn Rile, MD;  Location: [facility];  Service: Cardiovascular;  Laterality: N/A;   TRIGGER FINGER RELEASE  2011   4th finger left hand   VIDEO ASSISTED THORACOSCOPY Left 12/26/2016   Procedure: VIDEO ASSISTED THORACOSCOPY;  Surgeon: [name], MD;  Location: [facility];  Service: Thoracic;  Laterality: Left;   VIDEO BRONCHOSCOPY N/A 12/26/2016   Procedure: VIDEO BRONCHOSCOPY;  Surgeon: [name], MD;  Location: [facility];  Service: Thoracic;  Laterality: N/A;   Patient Active Problem List   Diagnosis Date Noted   Intermediate stage nonexudative age-related macular degeneration of both eyes 04/25/2022   Research study patient 12/24/2021   Post-operative pain    CKD (chronic kidney disease), stage II    Labile blood glucose    Pulmonary fibrosis ([place])    Hyponatremia    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[place]) 03/26/2021   S/P BKA (below knee amputation) unilateral, right ([place])    Shortness of breath    Acute blood loss anemia    Stage 3 chronic kidney disease ([place])    Atrial fibrillation ([place])    Controlled type 2 diabetes mellitus with  hyperglycemia, with long-term current use of insulin (H[facility])    Postoperative pain    Postoperative hemorrhagic shock 03/20/2021   Atherosclerosis of native arteries of extremities with gangrene, right leg ([place])    Hardware complicating wound infection ([place])    Abscess of bursa of right ankle 02/15/2021   Endocarditis 01/19/2021   Peripheral vascular disease ([place]) 12/29/2020   CHF (congestive heart failure), NYHA class II, chronic, diastolic ([place]) 14/48/1856   History of COVID-19 12/22/2020   SSS (sick sinus syndrome) ([place]) 12/22/2020   Acute bacterial endocarditis    MSSA bacteremia 12/09/2020   Diabetic foot ulcer ([place]) 12/09/2020   Foot drop, right foot    Generalized weakness 12/08/2020  Dehydration with hyponatremia 12/08/2020   Atrial fibrillation, chronic (HCC) 12/08/2020   Elevated troponin level not due myocardial infarction 12/08/2020   Adrenal insufficiency ([place]) 11/14/2020   Orthostatic hypotension 11/14/2020   Physical deconditioning 11/14/2020   Difficulty sleeping 11/07/2020   Postoperative anemia due to acute blood loss 11/07/2020   Right ventricular dysfunction 11/05/2020   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/04/2020   Hearing loss 11/03/2020   Cardiac device in situ 09/09/2020   Coronary atherosclerosis due to lipid rich plaque 09/02/2020   COVID-19 virus infection 07/18/2020   Coronary artery disease involving native heart without angina pectoris 07/10/2020   Chronic kidney disease, stage 3a ([place]) 03/29/2020   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/28/2020   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 ([place]) 03/16/2020   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 ([place]) 03/16/2020   Right epiretinal membrane 03/16/2020   Posterior vitreous detachment of right eye 03/16/2020   Aortic stenosis, moderate 03/12/2020   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   Near syncope 04/27/2018    Hypoglycemia due to insulin 01/06/2018   Essential hypertension 01/06/2018   Diabetes mellitus type 2, with complication, on long term insulin pump ([place]) 01/06/2018   Syncope and collapse 01/05/2018   Encounter for therapeutic drug monitoring 06/29/2017   OSA on CPAP 05/05/2017   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[place]) 01/05/2017   Abnormal chest x-ray 10/12/2016   Benign neoplasm of colon 01/14/2013    REFERRING DIAG: Z89.511 (ICD-10-CM) - Hx of right BKA  06/23/2022 left shoulder strain  ONSET DATE: 05/04/2022 MD visit with release to WB on LLE  THERAPY DIAG:  Unsteadiness on feet  Other abnormalities of gait and mobility  Muscle weakness (generalized)  Abnormal posture  History of falling  Rationale for Evaluation and Treatment Rehabilitation  PERTINENT HISTORY: CKD st III, AV block Mobitz, pacemaker, pulmonary fibrosis, CAD, CABG 21, HTN, A-Fib, DM2, PAD, right BKA, Left TMA  PRECAUTIONS: ICD/Pacemaker - continue monitoring vitals with activity and calculated HRmax is 151 bpm  SUBJECTIVE:  No new issues. He is seeing [name], NP tomorrow for wound care.   PAIN:    Are you having pain? Yes: NPRS scale: today  0/10, over last week lowest 0/10  Pain location: left shoulder lateral just distal to joint Pain description: tingling when shoulder in improper posture. Aggravating factors: improper posture. Relieving factors: correcting posture   OBJECTIVE: (objective measures completed at initial evaluation unless otherwise dated)  COGNITION: Overall cognitive status: Within functional limits for tasks assessed   POSTURE: 05/09/2022:  rounded shoulders, forward head, and flexed trunk    LOWER EXTREMITY ROM:    UPPER EXTREMITY ROM:  ROM P:passive  A:active Left 06/23/22 Supine all limited by pain Left 08/01/22 Seated All painfree Right 08/01/22 Seated  Shoulder flexion 30* A: 148 A: 153  Shoulder extension 0* neutral A: 72 A: 74  Shoulder abduction 45*  A: 135 A: 141  Shoulder adduction     Shoulder internal rotation 20* A: 84 A: 89  Shoulder external rotation 10* A: 88 A: 83  Elbow flexion     Elbow extension -15*    Wrist flexion     Wrist extension     Wrist ulnar deviation     Wrist radial deviation     Wrist pronation     Wrist supination      (Blank rows = not tested)    ROM P:passive  A:active Right Eval 05/09/2022 Left Eval 05/09/2022 Bilateral 08/03/22  Hip flexion       Hip extension A: -25* standing A: -25* standing A: -15*  RW support standing  Hip abduction       Hip adduction       Hip internal rotation       Hip external rotation       Knee flexion       Knee extension       Ankle dorsiflexion NA P: -17*   Ankle plantarflexion NA     Ankle inversion NA     Ankle eversion NA      (Blank rows = not tested)   LOWER EXTREMITY MMT:   MMT Right Eval 05/09/2022 Left Eval 05/09/2022  Hip flexion      Hip extension Functional testing 3-/5 Functional testing 3-/5  Hip abduction Functional testing 3-/5 Functional testing 3-/5  Hip adduction      Hip internal rotation      Hip external rotation      Knee flexion Functional testing 3-/5 Functional testing 3-/5  Knee extension Functional testing 3/5 Functional testing 3/5  Ankle dorsiflexion      Ankle plantarflexion      Ankle inversion      Ankle eversion      (Blank rows = not tested)   POSTURE:  06/23/2022  seated - head forward, rounded shoulders and posterior pelvic tilt  TRANSFERS: 05/09/2022:  Sit to stand: SBA and requires use of armrests from 20" stable w/c & needs RW for stabilization 05/09/2022:  Stand to sit: SBA and requires use of armrests from 20" stable w/c & needs RW for stabilization   GAIT: 08/03/2022: 2-minute Walk Test with RW, right TTA prosthesis & left AFO partial shoe filler. Resting pre HR 70 SpO2 100% HR after activity HR 52  SpO2 100% light amount of light headness Distance 158' with supervision.   Gait pattern: step-to pattern,  but distance improved since evaluation, Right hip hike, trunk flexed, and normal 4" BOS  Pt neg ramp & curb with RW, ight TTA prosthesis & left AFO partial shoe filler with supervision. He required seated rest half way due to fatigue.    05/09/2022:  Gait pattern: step to pattern, decreased step length- Left, decreased stance time- Right, Right hip hike, antalgic, lateral hip instability, trunk flexed, and wide BOS Distance walked: 25' Assistive device utilized: Environmental consultant - 2 wheeled and TTA prosthesis RLE Level of assistance: Min A Comments: pt reports dizziness with turning in gait   UE FUNCTION: 06/23/2022:  Pt reports that he uses RUE for fine motor like writing but uses both hands for gross motor or sports.  Seated - pt unable to raise LUE in flexion or abd due to pain.  Supine - pt unable to move LUE in flexion or abd due to pain.  PROM flexion > pain than abd   Pt tolerates elbow ext with palm supinated but with palm neutral causes shoulder pain. Also painful with elbow already extended and pronate palm.   PT simulated pushing on RW thru LUE with manual resistance.  Pt reports slight increase in pain but not as sharp as above function.    FUNCTIONAL TESTs:  Merrilee Jansky Balance Scale: 8/302023:  13/56 & tasks of Berg with RW support 45/56  [facility] PT Assessment - 08/03/22 0800       Berg Balance Test   Sit to Stand Needs minimal aid to stand or to stabilize  task with RW = 3   Standing Unsupported Able to stand 30 seconds unsupported   task with RW = 4   Sitting with Back Unsupported but Feet Supported on Floor or Stool Able to sit safely and securely 2 minutes    Stand to Sit Controls descent by using hands   task with RW = 3   Transfers Able to transfer safely, definite need of hands    Standing Unsupported with Eyes Closed Unable to keep eyes closed 3 seconds but stays steady   task with RW = 4   Standing Unsupported with Feet Together Needs help to attain position and unable to hold for 15  seconds   task with RW = 3   From Standing, Reach Forward with Outstretched Arm Reaches forward but needs supervision   task with RW = 4   From Standing Position, Pick up Object from Floor Unable to try/needs assist to keep balance   task with RW = 3   From Standing Position, Turn to Look Behind Over each Shoulder Needs assist to keep from losing balance and falling   task with RW = 3   Turn 360 Degrees Needs assistance while turning   task with RW = 2   Standing Unsupported, Alternately Place Feet on Step/Stool Needs assistance to keep from falling or unable to try   task with RW = 4   Standing Unsupported, One Foot in [employer] balance while stepping or standing   task with RW = 3   Standing on One Leg Unable to try or needs assist to prevent fall   task with RW = 4   Total Score 15    Berg comment: tasks of Berg with RW = 45/56              Berg Balance Scale: 05/09/2022:  9/56 & tasks of Berg with RW support 27/56    [facility] PT Assessment - 05/09/22 0930                Berg Balance Test    Sit to Stand Needs moderate or maximal assist to stand   task with RW support = 3    Standing Unsupported Unable to stand 30 seconds unassisted   task with RW support = 3    Sitting with Back Unsupported but Feet Supported on Floor or Stool Able to sit safely and securely 2 minutes     Stand to Sit Uses backs of legs against chair to control descent   task with RW support = 3    Transfers Able to transfer safely, definite need of hands     Standing Unsupported with Eyes Closed Needs help to keep from falling   task with RW support = 3    Standing Unsupported with Feet Together Needs help to attain position and unable to hold for 15 seconds   task with RW support = 2    From Standing, Reach Forward with Outstretched Arm Loses balance while trying/requires external support   task with RW support = 1    From Standing Position, Pick up Object from Floor Unable to try/needs assist to keep balance    task with RW support = 2    From Standing Position, Turn to Look Behind Over each Shoulder Needs assist to keep from losing balance and falling   task with RW support = 1    Turn 360 Degrees Needs assistance while turning   task with RW support =  0    Standing Unsupported, Alternately Place Feet on Step/Stool Needs assistance to keep from falling or unable to try   task with RW support = 0    Standing Unsupported, One Foot in [employer] balance while stepping or standing   task with RW support = 1    Standing on One Leg Unable to try or needs assist to prevent fall   task with RW support = 1    Total Score 9     Berg comment: tasks of Berg with RW support 27/56                     CURRENT PROSTHETIC WEAR ASSESSMENT: 08/03/2022: Pt wears both right TTA prosthesis & left AFO partial shoe filler>90% of awake hours.  He has 2 wounds on dorsum of left foot being monitored by PCP.  He is wearing left Vive Wear compression sock below knee on LLE which has helped wounds to heal. Pt is independent in donning & doffing both prosthesis & AFO. Skin check, cleaning and minor cues on adjusting ply socks.   05/09/2022:   Patient is dependent with: skin check, residual limb care, care of non-amputated limb, prosthetic cleaning, ply sock cleaning, and correct ply sock adjustment Donning prosthesis: Modified independence Doffing prosthesis: Modified independence Prosthetic wear tolerance: >90% of awake hours/day, 7 days/week Prosthetic weight bearing tolerance: 5 minutes Edema: LLE pitting edema with >10 sec capillary refill Residual limb condition: Right BKA limb has no openings, normal temperature & moisture.  Redness patella & patella tendon from friction of liner with knee flexed. Cylinderical shape.  LLE TMA pt's wife showed picture of wound on dorsum of foot that is being managed by PCP.   Prosthetic description: silicon liner with pin lock suspension, total contact socket, dynamic response foot.       TODAY'S TREATMENT:  08/03/2022 See Merrilee Jansky, Gait & prosthetic data above.   08/01/2022 TherEx: Shoulder AROM in seated without back support as noted in objective  Prosthetic Training with right TTA prosthesis & left dynamic AFO and custom shoe with toe filler and carbon plate: PT educated that right prosthesis & left forefoot do not have proprioception in non-weight bearing situations so he has to visually watch clearance like on his scooter. Pt & wife verbalized understanding.  Pt ambulate 106' with RW and SBA with visual target for foot placement including ascend curb and descend ramp SBA with verbal and visual cueing for foot placement when moving walker over curb, then ambulate 77' with ascend ramp and descend curb with same assist level and cueing Vitals following first walk, ramp, and curb: SpO2 88% HR 122 bpm PT discuss plan of care and assistive device progression, increasing walking tolerance outside of PT appointments with ramp and curb, pt and wife verbalized understanding  07/27/2022 Prosthetic Training with right TTA prosthesis & left dynamic AFO and custom shoe with toe filler and carbon plate: PT doff AFO for inspection of Lt foot, wound remained covered and pt and wife were instructed to show NP at appointment tomorrow Pt don AFO with simulated reacher, no verbal corrections required Pt ambulate 25' with RW and SBA with visual target for foot placement and verbal cueing to reduce Lt foot crossover Pt ascend and descend curb and ramp x1 with RW with CGA to SBA, pt required no cueing and appears safe to navigate ramp and curb with RW Vitals following walk, ramp, and curb: SpO2 90% HR 123 bpm PT discuss shoe clearance  concern with prosthetist and instruct pt and wife to search for shoes size 10.5 or 11 extra deep & wide within their out-of-pocket budget to discuss fit with prosthetist, they both verbalized understanding and size instructions were written for them  Neuro  Re-ed: Static stance x 30 sec, multiple instances of UE touch for support Modified tandem stance x 30 sec bil with full step forward, multiple instances of UE touch for support Addition of modified tandem stance to static balance at home with reminder of set up for safety, pt verbalized understanding  07/25/2022 Vitals -following ramp ascend and descend and 25' amb RW: SpO2 91%, HR 126 bpm -following 2 min seated rest: SpO2 96%, HR 99 bpm -following 110' amb RW: SpO2 95%, HR 108 bpm  Prosthetic Training with right TTA prosthesis & left dynamic AFO and custom shoe with toe filler and carbon plate: Pt demo donning AFO with simulated reacher, required correction of shin plate placement and ankle strap twisting with cues on potential skin irritation PT education on sweat management on residual limb and use of mineral oil to manage friction, pt verbalized understanding Sit to/from stand chair without armrests x 2 with minA for ascent and descent Pt amb 25' x 2 and 110' with RW and SBA with visual target for foot placement, PT demo supervision with w/c follow for wife and she verbalized understanding Pt ascend and descend curb x 1 ea. LE with RW with CGA following PT demo and verbal explanation of step and walker sequence, showed decreased eccentric control with RLE descent Pt ascend and descend ramp x1 with RW with CGA at trunk and minA to control walker during ramp descent, following PT demo and verbal explanation of weight bearing, step and walker sequencing  Self Care: PT education on signs of dehydration and water intake suggestions, pt verbalized understanding    HOME EXERCISE PROGRAM  Access Code: [email or web address] URL: [email or web address] Date: 07/05/2022 Prepared by: Jamey Reas  Exercises - Seated Correct Posture  - 1 x daily - 7 x weekly - Seated Scapular Retraction  - 2-5 x daily - 7 x weekly - 1 sets - 10 reps - 5 seconds hold - Supine Chin Tucks on Flat Ball  - 2-5 x  daily - 7 x weekly - 1 sets - 10 reps - 5 seconds hold - Seated Isometric Cervical Retraction with Chin Tucks with Pillow behind head  - 2-5 x daily - 7 x weekly - 1 sets - 10 reps - 5 seconds hold - Circular Shoulder Pendulum with Table Support  - 1-2 x daily - 7 x weekly - 1-2 sets - 10 reps - 5 seconds hold - Flexion-Extension Shoulder Pendulum with Table Support  - 1-2 x daily - 7 x weekly - 1-2 sets - 10 reps - Horizontal Shoulder Pendulum with Table Support  - 1-2 x daily - 7 x weekly - 1-2 sets - 10 reps - Supine Shoulder Flexion Extension AAROM with Dowel  - 1 x daily - 7 x weekly - 1-2 sets - 10 reps - 5 seconds hold - Supine Shoulder Abduction AAROM with [place]el  - 1 x daily - 7 x weekly - 1-2 sets - 10 reps - 5 seconds hold - Supine Shoulder External Rotation with Dowel  - 1 x daily - 7 x weekly - 1-2 sets - 10 reps - 5 seconds hold   Neuromuscular Reed-  cues on proprioception RLE using residual limb, balance & standing tolerance.   Do each exercise 1-2  times per day Do each exercise 5-10 repetitions Hold each exercise for 2 seconds to feel your location  AT [place].  Try to find this position when standing still for activities.   USE TAPE ON FLOOR TO MARK THE MIDLINE POSITION which is even with middle of sink.  You also should try to feel with your limb pressure in socket.  You are trying to feel with limb what you used to feel with the bottom of your foot.  Side to Side Shift: Moving your hips only (not shoulders): move weight onto your left leg, HOLD/FEEL pressure in socket.  Move back to equal weight on each leg, HOLD/FEEL pressure in socket. Move weight onto your right leg, HOLD/FEEL pressure in socket. Move back to equal weight on each leg, HOLD/FEEL pressure in socket. Repeat.  Start with both hands on sink, progress to hand on prosthetic side only, then no hands.  Front to Back Shift: Moving your hips only  (not shoulders): move your weight forward onto your toes, HOLD/FEEL pressure in socket. Move your weight back to equal Flat Foot on both legs, HOLD/FEEL  pressure in socket. Move your weight back onto your heels, HOLD/FEEL  pressure in socket. Move your weight back to equal on both legs, HOLD/FEEL  pressure in socket. Repeat.  Start with both hands on sink, progress to hand on prosthetic side only, then no hands.  Moving Cones / Cups: With equal weight on each leg: Hold on with one hand the first time, then progress to no hand supports. Move cups from one side of sink to the other. Place cups ~2" out of your reach, progress to 10" beyond reach.  Place one hand in middle of sink and reach with other hand. Do both arms.  Then hover one hand and move cups with other hand.  Overhead/Upward Reaching: alternated reaching up to top cabinets or ceiling if no cabinets present. Keep equal weight on each leg. Start with one hand support on counter while other hand reaches and progress to no hand support with reaching.  ace one hand in middle of sink and reach with other hand. Do both arms.  Then hover one hand and move cups with other hand.  5.   Looking Over Shoulders: With equal weight on each leg: alternate turning to look over your shoulders with one hand support on counter as needed.  Start with head motions only to look in front of shoulder, then even with shoulder and progress to looking behind you. To look to side, move head /eyes, then shoulder on side looking pulls back, shift more weight to side looking and pull hip back. Place one hand in middle of sink and let go with other hand so your shoulder can pull back. Switch hands to look other way.   Then hover one hand and look over shoulder. If looking right, use left hand at sink. If looking left, use right hand at sink. 6.  Alternate Tapping Toes or Stepping to lower shelf of cabinet  Move items under cabinet out of your way. Shift your hips/pelvis so weight on  stance leg & Tighten muscles in hip.  SLOWLY step other leg so front of foot is in cabinet. Then step back to floor. Repeat with other leg.     ASSESSMENT:   CLINICAL IMPRESSION: Patient improved balance with standard Berg without support to 13/56 from 9/56 but more importantly tasks of Berg (  14 balance items) with RW support to 45/56 from 27/56 indicating lower fall risk if he has support.  He improved gait with RW, prosthesis & AFO up to 24' with supervision from only 25' with modA. He can now negotiate ramps & curbs with RW with supervision.  He does fatigue easily with activities and HR dropped from 70 to 52 with gait. PT recommended contacting cardiologist to make him aware, which patient & wife agreed.  He appears that he could make even greater progress with additional skilled PT and patient & wife agree to recertification.   OBJECTIVE IMPAIRMENTS .Abnormal gait, cardiopulmonary status limiting activity, decreased activity tolerance, decreased balance, decreased endurance, decreased knowledge of use of DME, decreased mobility, decreased ROM, decreased strength, dizziness, increased edema, impaired flexibility, postural dysfunction, prosthetic dependency , obesity, and pain.    ACTIVITY LIMITATIONS carrying, lifting, bending, standing, stairs, transfers, locomotion level, UE use with ADLs, sleep    PARTICIPATION LIMITATIONS: driving, community activity, and yard work, UE use with ADLs &  household activities   [place], Time since onset of injury/illness/exacerbation, and 3+ comorbidities: see PMH  are also affecting patient's functional outcome.    REHAB POTENTIAL: Good   CLINICAL DECISION MAKING: Evolving/moderate complexity   EVALUATION COMPLEXITY: Moderate     GOALS: Goals reviewed with patient? Yes  SHORT TERM GOALS: Target date: 09/01/2022  1. Patient able to stand with no UE support for 30 seconds with supervision. Baseline: SEE OBJECTIVE DATA Goal status:  updated 08/03/22   2. Patient able to stand & sit from 18" without armrests using his UEs to RW safely Baseline: SEE OBJECTIVE DATA Goal status: NEW at recert  3. Patient ambulates up to 200' with RW, prosthesis & AFO with supervision.  Goal status: updated at recert 05/11/3015  4. Patient is able to negotiate a ramp & curb with RW with wife's supervision to enable basic community access without w/c.  Goal status: updated at recert 0/09/9322   LONG TERM GOALS: Target date: 08/04/2022   Patient demonstrates & verbalized understanding of prosthetic care to enable safe utilization of prosthesis. Baseline: SEE OBJECTIVE DATA Goal status: partially MET 08/03/22    Patient tolerates right prosthesis & left AFO / partial foot prosthesis wear >90% of awake hours without skin or limb pain issues. Baseline: SEE OBJECTIVE DATA Goal status: MET except still has wound on dorsum of left foot 08/03/2022   Berg Balance >/= 20/56 and tasks of Berg with RW support >/= 45/56 to indicate lower fall risk with standing ADLs Baseline: SEE OBJECTIVE DATA Goal status: partially MET 08/03/2022 Tasks with RW 45/56 but without RW only improved to 13/56   Patient ambulates >300' with prosthesis & LRAD modified independent Baseline: SEE OBJECTIVE DATA Goal status: progressing but not met 08/03/22, amb up to 158'   Patient negotiates ramps, curbs & stairs with single rail with prosthesis & LRAD modified independent. Baseline: SEE OBJECTIVE DATA Goal status: progressing but not met 08/03/22, neg ramp & curb with RW with supervision. PT has not tried stairs yet due to energy issues.  6. Left shoulder pain </= 3/10 with functional activities.  Goal status: met 08/01/22  7. Left shoulder AROM within 75% of Right shoulder  Goal status: met 08/01/22  8.patient able to use LUE for ADLs, gait, transfers, etc. Safely  Goal status: met 08/01/22   UPDATED LONG TERM GOALS: Target date: 10/26/2022   Patient demonstrates &  verbalized understanding of prosthetic care to enable safe utilization of prosthesis.  Baseline: SEE OBJECTIVE DATA Goal status: ongoing 08/03/22    Patient tolerates right prosthesis & left AFO / partial foot prosthesis wear >90% of awake hours without skin or limb pain issues. Baseline: SEE OBJECTIVE DATA Goal status: ongoing 08/03/22    Berg Balance >/= 20/56 and tasks of Berg with RW support >/= 52/56 to indicate lower fall risk with standing ADLs Baseline: SEE OBJECTIVE DATA Goal status: 08/03/2022 updated    Patient ambulates >300' with prosthesis & LRAD modified independent Baseline: SEE OBJECTIVE DATA Goal status: ongoing 08/03/2022   Patient negotiates ramps, curbs & stairs with single rail with prosthesis & LRAD modified independent. Baseline: SEE OBJECTIVE DATA Goal status: ongoing 08/03/22  PLAN: PT FREQUENCY: 2x/week   PT DURATION: 12 weeks  PLANNED INTERVENTIONS: Therapeutic exercises, Therapeutic activity, Neuromuscular re-education, Balance training, Gait training, Patient/Family education, Self Care, Stair training, Vestibular training, Canalith repositioning, Prosthetic training, DME instructions, Re-evaluation, and physical performance testing    PLAN FOR NEXT SESSION:  continue to monitor HR & SpO2 with activities see if they contacted cardiologist, standing balance activities,  progress gait with RW increasing walking tolerance   Jamey Reas, PT, DPT 08/03/2022, 9:19 AM

## 2022-08-09 NOTE — Therapy (Signed)
OUTPATIENT PHYSICAL THERAPY TREATMENT NOTE, MEDICARE PROGRESS NOTE & Recertification   Patient Name: Shane [name] MRN: [id number] DOB:1941-11-14, 81 y.o., male Today's Date: 08/09/2022  PCP: Burnard Bunting, MD REFERRING PROVIDER: [name], MD for TTA  Progress Note Reporting Period 07/25/2022 to 08/03/2022  See note below for Objective Data and Assessment of Progress/Goals.      END OF SESSION:      Past Medical History:  Diagnosis Date   Anxiety    AV block, Mobitz 1    Cataract    Chronic kidney disease    d/t DM   CKD (chronic kidney disease), stage III (HCC)    Depression    Diabetes mellitus    Vgo disposal insulin bolus  simular to insulin pump   Dyspnea    GERD (gastroesophageal reflux disease)    History of kidney stones    passed   Hyperlipidemia    Hypertension    Idiopathic pulmonary fibrosis ([place]) 11/2016   ILD (interstitial lung disease) (HCC)    Moderate aortic stenosis    a. 10/2019 Echo: EF 55-60%, Gr2 DD. Nl RV.    Neuromuscular disorder ([place])    Neuropathy associated with endocrine disorder ([place])    Nonobstructive CAD (coronary artery disease)    a. 2012 Cath: mod, nonobs dzs; b. 10/2016 MV: EF 60%, no ischemia.   OSA on CPAP 05/05/2017   Unattended Home Sleep Test 7/2/813-AHI 38.6/hour, desaturation to 64%, body weight 261 pounds   PONV (postoperative nausea and vomiting)    Sleep apnea     uses cpap asked to bring mask and tubing   Past Surgical History:  Procedure Laterality Date   ABDOMINAL AORTOGRAM W/LOWER EXTREMITY N/A 12/10/2020   Procedure: ABDOMINAL AORTOGRAM W/LOWER EXTREMITY;  Surgeon: Cherre Robins, MD;  Location: [place] [facility];  Service: Cardiovascular;  Laterality: N/A;   AMPUTATION Right 01/22/2021   Procedure: 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;  Surgeon: [name], MD;  Location: [place];  Service: Orthopedics;  Laterality: Right;   AMPUTATION Right 03/17/2021   Procedure: RIGHT BELOW KNEE AMPUTATION;  Surgeon: [name], MD;  Location: [place];  Service: Orthopedics;  Laterality: Right;   ANKLE FUSION Right 01/22/2021   Procedure: RIGHT FOOT TIBIOCALCANEAL FUSION;  Surgeon: [name], MD;  Location: [place];  Service: Orthopedics;  Laterality: Right;   ANTERIOR FUSION CERVICAL SPINE  2012   CARDIAC CATHETERIZATION  2011   CARDIAC CATHETERIZATION N/A 11/09/2016   Procedure: Right Heart Cath;  Surgeon: Belva Crome, MD;  Location: [place] [facility];  Service: Cardiovascular;  Laterality: N/A;   carpel tunnel     left wrist   CATARACT EXTRACTION     CATARACT EXTRACTION W/ INTRAOCULAR LENS  IMPLANT, BILATERAL  2013   CERVICAL LAMINECTOMY  2012   COLONOSCOPY N/A 01/14/2013   Procedure: COLONOSCOPY;  Surgeon: Irene Shipper, MD;  Location: [facility];  Service: Endoscopy;  Laterality: N/A;   CORONARY ARTERY BYPASS GRAFT  11/04/2020   LIMA-LAD, SVG-OM1, SVG-PDA (Dr [facility]) dc 11/18/2020   EYE SURGERY     I & D EXTREMITY Right 02/19/2021   Procedure: RIGHT ANKLE DEBRIDEMENT AND PLACEMENT ANTIBIOTIC BEADS;  Surgeon: [name], MD;  Location: [place];  Service: Orthopedics;  Laterality: Right;   KNEE SURGERY  1998   left   LEFT HEART CATH AND CORONARY ANGIOGRAPHY N/A 07/10/2020   Procedure: LEFT HEART CATH AND CORONARY ANGIOGRAPHY;  Surgeon: Sherren Mocha, MD;  Location: [place] [facility];  Service: Cardiovascular;  Laterality: N/A;   LUMBAR LAMINECTOMY  2003   LUNG BIOPSY Left 12/26/2016   Procedure: LUNG BIOPSY;  Surgeon: [name], MD;  Location: [place];  Service: Thoracic;  Laterality: Left;   PACEMAKER IMPLANT N/A 03/30/2020   Procedure: PACEMAKER IMPLANT;  Surgeon: Evans Lance, MD;  Location: [place] [facility];  Service: Cardiovascular;  Laterality: N/A;   PERIPHERAL VASCULAR INTERVENTION Right 12/10/2020   Procedure: PERIPHERAL VASCULAR INTERVENTION;  Surgeon: Cherre Robins, MD;  Location: [place] [facility];  Service: Cardiovascular;  Laterality: Right;  SFA   POSTERIOR  FUSION CERVICAL SPINE  2012   PPM GENERATOR REMOVAL N/A 12/14/2020   Procedure: PPM GENERATOR REMOVAL;  Surgeon: Evans Lance, MD;  Location: [place] [facility];  Service: Cardiovascular;  Laterality: N/A;   TEE WITHOUT CARDIOVERSION N/A 12/11/2020   Procedure: TRANSESOPHAGEAL ECHOCARDIOGRAM (TEE);  Surgeon: Geralynn Rile, MD;  Location: [place];  Service: Cardiovascular;  Laterality: N/A;   TRIGGER FINGER RELEASE  2011   4th finger left hand   VIDEO ASSISTED THORACOSCOPY Left 12/26/2016   Procedure: VIDEO ASSISTED THORACOSCOPY;  Surgeon: [name], MD;  Location: [place];  Service: Thoracic;  Laterality: Left;   VIDEO BRONCHOSCOPY N/A 12/26/2016   Procedure: VIDEO BRONCHOSCOPY;  Surgeon: [name], MD;  Location: [place];  Service: Thoracic;  Laterality: N/A;   Patient Active Problem List   Diagnosis Date Noted   Intermediate stage nonexudative age-related macular degeneration of both eyes 04/25/2022   Research study patient 12/24/2021   Post-operative pain    CKD (chronic kidney disease), stage II    Labile blood glucose    Pulmonary fibrosis ([place])    Hyponatremia    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[facility] [place] [facility]) 03/26/2021   S/P BKA (below knee amputation) unilateral, right ([place])    Shortness of breath    Acute blood loss anemia    Stage 3 chronic kidney disease ([place])    Atrial fibrillation ([place])    Controlled type 2 diabetes mellitus with hyperglycemia, with long-term current use of insulin (HCC)    Postoperative pain    Postoperative hemorrhagic shock 03/20/2021   Atherosclerosis of native arteries of extremities with gangrene, right leg ([place])    Hardware complicating wound infection ([place])    Abscess of bursa of right ankle 02/15/2021   Endocarditis 01/19/2021   Peripheral vascular disease ([place]) 12/29/2020   CHF (congestive heart failure), NYHA class II, chronic, diastolic ([place]) 24/26/8341   History of COVID-19 12/22/2020   SSS (sick sinus syndrome) ([place])  12/22/2020   Acute bacterial endocarditis    MSSA bacteremia 12/09/2020   Diabetic foot ulcer ([place]) 12/09/2020   Foot drop, right foot    Generalized weakness 12/08/2020   Dehydration with hyponatremia 12/08/2020   Atrial fibrillation, chronic (HCC) 12/08/2020   Elevated troponin level not due myocardial infarction 12/08/2020   Adrenal insufficiency ([place]) 11/14/2020   Orthostatic hypotension 11/14/2020   Physical deconditioning 11/14/2020   Difficulty sleeping 11/07/2020   Postoperative anemia due to acute blood loss 11/07/2020   Right ventricular dysfunction 11/05/2020   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/04/2020   Hearing loss 11/03/2020   Cardiac device in situ 09/09/2020   Coronary atherosclerosis due to lipid rich plaque 09/02/2020   COVID-19 virus infection 07/18/2020   Coronary artery disease involving native heart without angina pectoris 07/10/2020   Chronic kidney disease, stage 3a ([place]) 03/29/2020   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/28/2020  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 ([place]) 03/16/2020   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 ([place]) 03/16/2020   Right epiretinal membrane 03/16/2020   Posterior vitreous detachment of right eye 03/16/2020   Aortic stenosis, moderate 03/12/2020   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   Near syncope 04/27/2018   Hypoglycemia due to insulin 01/06/2018   Essential hypertension 01/06/2018   Diabetes mellitus type 2, with complication, on long term insulin pump ([place]) 01/06/2018   Syncope and collapse 01/05/2018   Encounter for therapeutic drug monitoring 06/29/2017   OSA on CPAP 05/05/2017   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[place]) 01/05/2017   Abnormal chest x-ray 10/12/2016   Benign neoplasm of colon 01/14/2013    REFERRING DIAG: Z89.511 (ICD-10-CM) - Hx of right BKA  06/23/2022 left shoulder strain  ONSET DATE: 05/04/2022 MD visit  with release to WB on LLE  THERAPY DIAG:  No diagnosis found.  Rationale for Evaluation and Treatment Rehabilitation  PERTINENT HISTORY: CKD st III, AV block Mobitz, pacemaker, pulmonary fibrosis, CAD, CABG 21, HTN, A-Fib, DM2, PAD, right BKA, Left TMA  PRECAUTIONS: ICD/Pacemaker - continue monitoring vitals with activity and calculated HRmax is 151 bpm  SUBJECTIVE:  ***No new issues. He is seeing [name], NP tomorrow for wound care.   PAIN:    Are you having pain? Yes: NPRS scale: today *** 0/10, over last week lowest 0/10  Pain location: left shoulder lateral just distal to joint Pain description: tingling when shoulder in improper posture. Aggravating factors: improper posture. Relieving factors: correcting posture   OBJECTIVE: (objective measures completed at initial evaluation unless otherwise dated)  COGNITION: Overall cognitive status: Within functional limits for tasks assessed   POSTURE: 05/09/2022:  rounded shoulders, forward head, and flexed trunk    LOWER EXTREMITY ROM:    UPPER EXTREMITY ROM:  ROM P:passive  A:active Left 06/23/22 Supine all limited by pain Left 08/01/22 Seated All painfree Right 08/01/22 Seated  Shoulder flexion 30* A: 148 A: 153  Shoulder extension 0* neutral A: 72 A: 74  Shoulder abduction 45* A: 135 A: 141  Shoulder adduction     Shoulder internal rotation 20* A: 84 A: 89  Shoulder external rotation 10* A: 88 A: 83  Elbow flexion     Elbow extension -15*    Wrist flexion     Wrist extension     Wrist ulnar deviation     Wrist radial deviation     Wrist pronation     Wrist supination      (Blank rows = not tested)    ROM P:passive  A:active Right Eval 05/09/2022 Left Eval 05/09/2022 Bilateral 08/03/22  Hip flexion       Hip extension A: -25* standing A: -25* standing A: -15*  RW support standing  Hip abduction       Hip adduction       Hip internal rotation       Hip external rotation       Knee flexion        Knee extension       Ankle dorsiflexion NA P: -17*   Ankle plantarflexion NA     Ankle inversion NA     Ankle eversion NA      (Blank rows = not tested)   LOWER EXTREMITY MMT:   MMT Right Eval 05/09/2022 Left Eval 05/09/2022  Hip flexion  Hip extension Functional testing 3-/5 Functional testing 3-/5  Hip abduction Functional testing 3-/5 Functional testing 3-/5  Hip adduction      Hip internal rotation      Hip external rotation      Knee flexion Functional testing 3-/5 Functional testing 3-/5  Knee extension Functional testing 3/5 Functional testing 3/5  Ankle dorsiflexion      Ankle plantarflexion      Ankle inversion      Ankle eversion      (Blank rows = not tested)   POSTURE:  06/23/2022  seated - head forward, rounded shoulders and posterior pelvic tilt  TRANSFERS: 05/09/2022:  Sit to stand: SBA and requires use of armrests from 20" stable w/c & needs RW for stabilization 05/09/2022:  Stand to sit: SBA and requires use of armrests from 20" stable w/c & needs RW for stabilization   GAIT: 08/03/2022: 2-minute Walk Test with RW, right TTA prosthesis & left AFO partial shoe filler. Resting pre HR 70 SpO2 100% HR after activity HR 52  SpO2 100% light amount of light headness Distance 158' with supervision.   Gait pattern: step-to pattern, but distance improved since evaluation, Right hip hike, trunk flexed, and normal 4" BOS  Pt neg ramp & curb with RW, ight TTA prosthesis & left AFO partial shoe filler with supervision. He required seated rest half way due to fatigue.    05/09/2022:  Gait pattern: step to pattern, decreased step length- Left, decreased stance time- Right, Right hip hike, antalgic, lateral hip instability, trunk flexed, and wide BOS Distance walked: 25' Assistive device utilized: Environmental consultant - 2 wheeled and TTA prosthesis RLE Level of assistance: Min A Comments: pt reports dizziness with turning in gait   UE FUNCTION: 06/23/2022:  Pt reports that he uses RUE  for fine motor like writing but uses both hands for gross motor or sports.  Seated - pt unable to raise LUE in flexion or abd due to pain.  Supine - pt unable to move LUE in flexion or abd due to pain.  PROM flexion > pain than abd   Pt tolerates elbow ext with palm supinated but with palm neutral causes shoulder pain. Also painful with elbow already extended and pronate palm.   PT simulated pushing on RW thru LUE with manual resistance.  Pt reports slight increase in pain but not as sharp as above function.    FUNCTIONAL TESTs:  Merrilee Jansky Balance Scale: 8/302023:  13/56 & tasks of Berg with RW support 45/56     Berg Balance Scale: 05/09/2022:  9/56 & tasks of Berg with RW support 27/56    [facility] PT Assessment - 05/09/22 0930                Berg Balance Test    Sit to Stand Needs moderate or maximal assist to stand   task with RW support = 3    Standing Unsupported Unable to stand 30 seconds unassisted   task with RW support = 3    Sitting with Back Unsupported but Feet Supported on Floor or Stool Able to sit safely and securely 2 minutes     Stand to Sit Uses backs of legs against chair to control descent   task with RW support = 3    Transfers Able to transfer safely, definite need of hands     Standing Unsupported with Eyes Closed Needs help to keep from falling   task with RW support = 3  Standing Unsupported with Feet Together Needs help to attain position and unable to hold for 15 seconds   task with RW support = 2    From Standing, Reach Forward with Outstretched Arm Loses balance while trying/requires external support   task with RW support = 1    From Standing Position, Pick up Object from Floor Unable to try/needs assist to keep balance   task with RW support = 2    From Standing Position, Turn to Look Behind Over each Shoulder Needs assist to keep from losing balance and falling   task with RW support = 1    Turn 360 Degrees Needs assistance while turning   task with RW support = 0     Standing Unsupported, Alternately Place Feet on Step/Stool Needs assistance to keep from falling or unable to try   task with RW support = 0    Standing Unsupported, One Foot in [employer] balance while stepping or standing   task with RW support = 1    Standing on One Leg Unable to try or needs assist to prevent fall   task with RW support = 1    Total Score 9     Berg comment: tasks of Berg with RW support 27/56                     CURRENT PROSTHETIC WEAR ASSESSMENT: 08/03/2022: Pt wears both right TTA prosthesis & left AFO partial shoe filler>90% of awake hours.  He has 2 wounds on dorsum of left foot being monitored by PCP.  He is wearing left Vive Wear compression sock below knee on LLE which has helped wounds to heal. Pt is independent in donning & doffing both prosthesis & AFO. Skin check, cleaning and minor cues on adjusting ply socks.   05/09/2022:   Patient is dependent with: skin check, residual limb care, care of non-amputated limb, prosthetic cleaning, ply sock cleaning, and correct ply sock adjustment Donning prosthesis: Modified independence Doffing prosthesis: Modified independence Prosthetic wear tolerance: >90% of awake hours/day, 7 days/week Prosthetic weight bearing tolerance: 5 minutes Edema: LLE pitting edema with >10 sec capillary refill Residual limb condition: Right BKA limb has no openings, normal temperature & moisture.  Redness patella & patella tendon from friction of liner with knee flexed. Cylinderical shape.  LLE TMA pt's wife showed picture of wound on dorsum of foot that is being managed by PCP.   Prosthetic description: silicon liner with pin lock suspension, total contact socket, dynamic response foot.      TODAY'S TREATMENT:  08/09/2022 ***   08/03/2022 See Merrilee Jansky, Gait & prosthetic data above.   08/01/2022 TherEx: Shoulder AROM in seated without back support as noted in objective  Prosthetic Training with right TTA prosthesis & left dynamic  AFO and custom shoe with toe filler and carbon plate: PT educated that right prosthesis & left forefoot do not have proprioception in non-weight bearing situations so he has to visually watch clearance like on his scooter. Pt & wife verbalized understanding.  Pt ambulate 68' with RW and SBA with visual target for foot placement including ascend curb and descend ramp SBA with verbal and visual cueing for foot placement when moving walker over curb, then ambulate 80' with ascend ramp and descend curb with same assist level and cueing Vitals following first walk, ramp, and curb: SpO2 88% HR 122 bpm PT discuss plan of care and assistive device progression, increasing walking tolerance outside of PT appointments  with ramp and curb, pt and wife verbalized understanding  07/27/2022 Prosthetic Training with right TTA prosthesis & left dynamic AFO and custom shoe with toe filler and carbon plate: PT doff AFO for inspection of Lt foot, wound remained covered and pt and wife were instructed to show NP at appointment tomorrow Pt don AFO with simulated reacher, no verbal corrections required Pt ambulate 25' with RW and SBA with visual target for foot placement and verbal cueing to reduce Lt foot crossover Pt ascend and descend curb and ramp x1 with RW with CGA to SBA, pt required no cueing and appears safe to navigate ramp and curb with RW Vitals following walk, ramp, and curb: SpO2 90% HR 123 bpm PT discuss shoe clearance concern with prosthetist and instruct pt and wife to search for shoes size 10.5 or 11 extra deep & wide within their out-of-pocket budget to discuss fit with prosthetist, they both verbalized understanding and size instructions were written for them  Neuro Re-ed: Static stance x 30 sec, multiple instances of UE touch for support Modified tandem stance x 30 sec bil with full step forward, multiple instances of UE touch for support Addition of modified tandem stance to static balance at home  with reminder of set up for safety, pt verbalized understanding  07/25/2022 Vitals -following ramp ascend and descend and 25' amb RW: SpO2 91%, HR 126 bpm -following 2 min seated rest: SpO2 96%, HR 99 bpm -following 110' amb RW: SpO2 95%, HR 108 bpm  Prosthetic Training with right TTA prosthesis & left dynamic AFO and custom shoe with toe filler and carbon plate: Pt demo donning AFO with simulated reacher, required correction of shin plate placement and ankle strap twisting with cues on potential skin irritation PT education on sweat management on residual limb and use of mineral oil to manage friction, pt verbalized understanding Sit to/from stand chair without armrests x 2 with minA for ascent and descent Pt amb 25' x 2 and 110' with RW and SBA with visual target for foot placement, PT demo supervision with w/c follow for wife and she verbalized understanding Pt ascend and descend curb x 1 ea. LE with RW with CGA following PT demo and verbal explanation of step and walker sequence, showed decreased eccentric control with RLE descent Pt ascend and descend ramp x1 with RW with CGA at trunk and minA to control walker during ramp descent, following PT demo and verbal explanation of weight bearing, step and walker sequencing  Self Care: PT education on signs of dehydration and water intake suggestions, pt verbalized understanding    HOME EXERCISE PROGRAM  Access Code: [email or web address] URL: [email or web address] Date: 07/05/2022 Prepared by: Jamey Reas  Exercises - Seated Correct Posture  - 1 x daily - 7 x weekly - Seated Scapular Retraction  - 2-5 x daily - 7 x weekly - 1 sets - 10 reps - 5 seconds hold - Supine Chin Tucks on Flat Ball  - 2-5 x daily - 7 x weekly - 1 sets - 10 reps - 5 seconds hold - Seated Isometric Cervical Retraction with Chin Tucks with Pillow behind head  - 2-5 x daily - 7 x weekly - 1 sets - 10 reps - 5 seconds hold - Circular Shoulder Pendulum with Table  Support  - 1-2 x daily - 7 x weekly - 1-2 sets - 10 reps - 5 seconds hold - Flexion-Extension Shoulder Pendulum with Table Support  - 1-2 x daily - 7 x weekly - 1-2  sets - 10 reps - Horizontal Shoulder Pendulum with Table Support  - 1-2 x daily - 7 x weekly - 1-2 sets - 10 reps - Supine Shoulder Flexion Extension AAROM with Dowel  - 1 x daily - 7 x weekly - 1-2 sets - 10 reps - 5 seconds hold - Supine Shoulder Abduction AAROM with Dowel  - 1 x daily - 7 x weekly - 1-2 sets - 10 reps - 5 seconds hold - Supine Shoulder External Rotation with Dowel  - 1 x daily - 7 x weekly - 1-2 sets - 10 reps - 5 seconds hold   Neuromuscular Reed-  cues on proprioception RLE using residual limb, balance & standing tolerance.   Do each exercise 1-2  times per day Do each exercise 5-10 repetitions Hold each exercise for 2 seconds to feel your location  AT [place].  Try to find this position when standing still for activities.   USE TAPE ON FLOOR TO MARK THE MIDLINE POSITION which is even with middle of sink.  You also should try to feel with your limb pressure in socket.  You are trying to feel with limb what you used to feel with the bottom of your foot.  Side to Side Shift: Moving your hips only (not shoulders): move weight onto your left leg, HOLD/FEEL pressure in socket.  Move back to equal weight on each leg, HOLD/FEEL pressure in socket. Move weight onto your right leg, HOLD/FEEL pressure in socket. Move back to equal weight on each leg, HOLD/FEEL pressure in socket. Repeat.  Start with both hands on sink, progress to hand on prosthetic side only, then no hands.  Front to Back Shift: Moving your hips only (not shoulders): move your weight forward onto your toes, HOLD/FEEL pressure in socket. Move your weight back to equal Flat Foot on both legs, HOLD/FEEL  pressure in socket. Move your weight back onto your heels, HOLD/FEEL  pressure in  socket. Move your weight back to equal on both legs, HOLD/FEEL  pressure in socket. Repeat.  Start with both hands on sink, progress to hand on prosthetic side only, then no hands.  Moving Cones / Cups: With equal weight on each leg: Hold on with one hand the first time, then progress to no hand supports. Move cups from one side of sink to the other. Place cups ~2" out of your reach, progress to 10" beyond reach.  Place one hand in middle of sink and reach with other hand. Do both arms.  Then hover one hand and move cups with other hand.  Overhead/Upward Reaching: alternated reaching up to top cabinets or ceiling if no cabinets present. Keep equal weight on each leg. Start with one hand support on counter while other hand reaches and progress to no hand support with reaching.  ace one hand in middle of sink and reach with other hand. Do both arms.  Then hover one hand and move cups with other hand.  5.   Looking Over Shoulders: With equal weight on each leg: alternate turning to look over your shoulders with one hand support on counter as needed.  Start with head motions only to look in front of shoulder, then even with shoulder and progress to looking behind you. To look to side, move head /eyes, then shoulder on side looking pulls back, shift more weight to side looking and pull hip back. Place one hand in middle of  sink and let go with other hand so your shoulder can pull back. Switch hands to look other way.   Then hover one hand and look over shoulder. If looking right, use left hand at sink. If looking left, use right hand at sink. 6.  Alternate Tapping Toes or Stepping to lower shelf of cabinet  Move items under cabinet out of your way. Shift your hips/pelvis so weight on stance leg & Tighten muscles in hip.  SLOWLY step other leg so front of foot is in cabinet. Then step back to floor. Repeat with other leg.     ASSESSMENT:   CLINICAL IMPRESSION: ***Patient improved balance with standard Berg  without support to 13/56 from 9/56 but more importantly tasks of Berg (14 balance items) with RW support to 45/56 from 27/56 indicating lower fall risk if he has support.  He improved gait with RW, prosthesis & AFO up to 15' with supervision from only 25' with modA. He can now negotiate ramps & curbs with RW with supervision.  He does fatigue easily with activities and HR dropped from 70 to 52 with gait. PT recommended contacting cardiologist to make him aware, which patient & wife agreed.  He appears that he could make even greater progress with additional skilled PT and patient & wife agree to recertification.   OBJECTIVE IMPAIRMENTS .Abnormal gait, cardiopulmonary status limiting activity, decreased activity tolerance, decreased balance, decreased endurance, decreased knowledge of use of DME, decreased mobility, decreased ROM, decreased strength, dizziness, increased edema, impaired flexibility, postural dysfunction, prosthetic dependency , obesity, and pain.    ACTIVITY LIMITATIONS carrying, lifting, bending, standing, stairs, transfers, locomotion level, UE use with ADLs, sleep    PARTICIPATION LIMITATIONS: driving, community activity, and yard work, UE use with ADLs &  household activities   [place], Time since onset of injury/illness/exacerbation, and 3+ comorbidities: see PMH  are also affecting patient's functional outcome.    REHAB POTENTIAL: Good   CLINICAL DECISION MAKING: Evolving/moderate complexity   EVALUATION COMPLEXITY: Moderate     GOALS: Goals reviewed with patient? Yes  SHORT TERM GOALS: Target date: 09/01/2022  1. Patient able to stand with no UE support for 30 seconds with supervision. Baseline: SEE OBJECTIVE DATA Goal status: updated 08/03/22   2. Patient able to stand & sit from 18" without armrests using his UEs to RW safely Baseline: SEE OBJECTIVE DATA Goal status: NEW at recert  3. Patient ambulates up to 200' with RW, prosthesis & AFO with  supervision.  Goal status: updated at recert 3/89/3734  4. Patient is able to negotiate a ramp & curb with RW with wife's supervision to enable basic community access without w/c.  Goal status: updated at recert 2/87/6811   LONG TERM GOALS: Target date: 08/04/2022   Patient demonstrates & verbalized understanding of prosthetic care to enable safe utilization of prosthesis. Baseline: SEE OBJECTIVE DATA Goal status: partially MET 08/03/22    Patient tolerates right prosthesis & left AFO / partial foot prosthesis wear >90% of awake hours without skin or limb pain issues. Baseline: SEE OBJECTIVE DATA Goal status: MET except still has wound on dorsum of left foot 08/03/2022   Berg Balance >/= 20/56 and tasks of Berg with RW support >/= 45/56 to indicate lower fall risk with standing ADLs Baseline: SEE OBJECTIVE DATA Goal status: partially MET 08/03/2022 Tasks with RW 45/56 but without RW only improved to 13/56   Patient ambulates >300' with prosthesis & LRAD modified independent Baseline: SEE OBJECTIVE DATA Goal status:  progressing but not met 08/03/22, amb up to 158'   Patient negotiates ramps, curbs & stairs with single rail with prosthesis & LRAD modified independent. Baseline: SEE OBJECTIVE DATA Goal status: progressing but not met 08/03/22, neg ramp & curb with RW with supervision. PT has not tried stairs yet due to energy issues.  6. Left shoulder pain </= 3/10 with functional activities.  Goal status: met 08/01/22  7. Left shoulder AROM within 75% of Right shoulder  Goal status: met 08/01/22  8.patient able to use LUE for ADLs, gait, transfers, etc. Safely  Goal status: met 08/01/22   UPDATED LONG TERM GOALS: Target date: 10/26/2022   Patient demonstrates & verbalized understanding of prosthetic care to enable safe utilization of prosthesis. Baseline: SEE OBJECTIVE DATA Goal status: ongoing 08/03/22    Patient tolerates right prosthesis & left AFO / partial foot prosthesis wear  >90% of awake hours without skin or limb pain issues. Baseline: SEE OBJECTIVE DATA Goal status: ongoing 08/03/22    Berg Balance >/= 20/56 and tasks of Berg with RW support >/= 52/56 to indicate lower fall risk with standing ADLs Baseline: SEE OBJECTIVE DATA Goal status: 08/03/2022 updated    Patient ambulates >300' with prosthesis & LRAD modified independent Baseline: SEE OBJECTIVE DATA Goal status: ongoing 08/03/2022   Patient negotiates ramps, curbs & stairs with single rail with prosthesis & LRAD modified independent. Baseline: SEE OBJECTIVE DATA Goal status: ongoing 08/03/22  PLAN: PT FREQUENCY: 2x/week   PT DURATION: 12 weeks  PLANNED INTERVENTIONS: Therapeutic exercises, Therapeutic activity, Neuromuscular re-education, Balance training, Gait training, Patient/Family education, Self Care, Stair training, Vestibular training, Canalith repositioning, Prosthetic training, DME instructions, Re-evaluation, and physical performance testing    PLAN FOR NEXT SESSION: *** continue to monitor HR & SpO2 with activities see if they contacted cardiologist, standing balance activities,  progress gait with RW increasing walking tolerance   Jana Hakim, Student-PT, DPT 08/09/2022, 4:34 PM

## 2022-08-10 ENCOUNTER — Ambulatory Visit (INDEPENDENT_AMBULATORY_CARE_PROVIDER_SITE_OTHER): Payer: Medicare Other | Admitting: Physical Therapy

## 2022-08-10 ENCOUNTER — Encounter: Payer: Self-pay | Admitting: Physical Therapy

## 2022-08-10 DIAGNOSIS — Z9181 History of falling: Secondary | ICD-10-CM

## 2022-08-10 DIAGNOSIS — R2681 Unsteadiness on feet: Secondary | ICD-10-CM

## 2022-08-10 DIAGNOSIS — M6281 Muscle weakness (generalized): Secondary | ICD-10-CM | POA: Diagnosis not present

## 2022-08-10 DIAGNOSIS — R293 Abnormal posture: Secondary | ICD-10-CM | POA: Diagnosis not present

## 2022-08-10 DIAGNOSIS — R2689 Other abnormalities of gait and mobility: Secondary | ICD-10-CM | POA: Diagnosis not present

## 2022-08-10 NOTE — Therapy (Signed)
OUTPATIENT PHYSICAL THERAPY TREATMENT NOTE   Patient Name: Shane [name] MRN: [id number] DOB:[birth year], 81 y.o., male Today's Date: 08/11/2022  PCP: Burnard Bunting, MD REFERRING PROVIDER: [name], MD for TTA    END OF SESSION:   PT End of Session - 08/11/22 0851     Visit Number 25    Number of Visits 46    Date for PT Re-Evaluation 10/26/22    Authorization Type UHC Medicare    Authorization Time Period [facility] MEDICARE  $20 COPAY    Progress Note Due on Visit 63    PT Start Time 0848    PT Stop Time 0930    PT Time Calculation (min) 42 min    Equipment Utilized During Treatment Gait belt    Activity Tolerance Patient tolerated treatment well;Patient limited by fatigue    Behavior During Therapy WFL for tasks assessed/performed             Past Medical History:  Diagnosis Date   Anxiety    AV block, Mobitz 1    Cataract    Chronic kidney disease    d/t DM   CKD (chronic kidney disease), stage III ([place])    Depression    Diabetes mellitus    Vgo disposal insulin bolus  simular to insulin pump   Dyspnea    GERD (gastroesophageal reflux disease)    History of kidney stones    passed   Hyperlipidemia    Hypertension    Idiopathic pulmonary fibrosis ([place]) 11/2016   ILD (interstitial lung disease) ([place])    Moderate aortic stenosis    a. 10/2019 Echo: EF 55-60%, Gr2 DD. Nl RV.    Neuromuscular disorder ([place])    Neuropathy associated with endocrine disorder ([place])    Nonobstructive CAD (coronary artery disease)    a. 2012 Cath: mod, nonobs dzs; b. 10/2016 MV: EF 60%, no ischemia.   OSA on CPAP 05/05/2017   Unattended Home Sleep Test 7/2/813-AHI 38.6/hour, desaturation to 64%, body weight 261 pounds   PONV (postoperative nausea and vomiting)    Sleep apnea     uses cpap asked to bring mask and tubing   Past Surgical History:  Procedure Laterality Date   ABDOMINAL AORTOGRAM W/LOWER EXTREMITY N/A 12/10/2020   Procedure: ABDOMINAL AORTOGRAM W/LOWER EXTREMITY;   Surgeon: Cherre Robins, MD;  Location: [place] [facility];  Service: Cardiovascular;  Laterality: N/A;   AMPUTATION Right 01/22/2021   Procedure: 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;  Surgeon: [name], MD;  Location: [place];  Service: Orthopedics;  Laterality: Right;   AMPUTATION Right 03/17/2021   Procedure: RIGHT BELOW KNEE AMPUTATION;  Surgeon: [name], MD;  Location: [place];  Service: Orthopedics;  Laterality: Right;   ANKLE FUSION Right 01/22/2021   Procedure: RIGHT FOOT TIBIOCALCANEAL FUSION;  Surgeon: [name], MD;  Location: [place];  Service: Orthopedics;  Laterality: Right;   ANTERIOR FUSION CERVICAL SPINE  2012   CARDIAC CATHETERIZATION  2011   CARDIAC CATHETERIZATION N/A 11/09/2016   Procedure: Right Heart Cath;  Surgeon: Belva Crome, MD;  Location: [place] [facility];  Service: Cardiovascular;  Laterality: N/A;   carpel tunnel     left wrist   CATARACT EXTRACTION     CATARACT EXTRACTION W/ INTRAOCULAR LENS  IMPLANT, BILATERAL  2013   CERVICAL LAMINECTOMY  2012   COLONOSCOPY N/A 01/14/2013   Procedure: COLONOSCOPY;  Surgeon: Irene Shipper, MD;  Location: [facility];  Service: Endoscopy;  Laterality:  N/A;   CORONARY ARTERY BYPASS GRAFT  11/04/2020   LIMA-LAD, SVG-OM1, SVG-PDA (Dr Marney [facility]) dc 11/18/2020   EYE SURGERY     I & D EXTREMITY Right 02/19/2021   Procedure: RIGHT ANKLE DEBRIDEMENT AND PLACEMENT ANTIBIOTIC BEADS;  Surgeon: [name], MD;  Location: [place];  Service: Orthopedics;  Laterality: Right;   KNEE SURGERY  1998   left   LEFT HEART CATH AND CORONARY ANGIOGRAPHY N/A 07/10/2020   Procedure: LEFT HEART CATH AND CORONARY ANGIOGRAPHY;  Surgeon: Sherren Mocha, MD;  Location: [place] [facility];  Service: Cardiovascular;  Laterality: N/A;   LUMBAR LAMINECTOMY  2003   LUNG BIOPSY Left 12/26/2016   Procedure: LUNG BIOPSY;  Surgeon: [name], MD;  Location: [place];  Service: Thoracic;  Laterality: Left;   PACEMAKER IMPLANT N/A 03/30/2020    Procedure: PACEMAKER IMPLANT;  Surgeon: Evans Lance, MD;  Location: [place] [facility];  Service: Cardiovascular;  Laterality: N/A;   PERIPHERAL VASCULAR INTERVENTION Right 12/10/2020   Procedure: PERIPHERAL VASCULAR INTERVENTION;  Surgeon: Cherre Robins, MD;  Location: [place] [facility];  Service: Cardiovascular;  Laterality: Right;  SFA   POSTERIOR FUSION CERVICAL SPINE  2012   PPM GENERATOR REMOVAL N/A 12/14/2020   Procedure: PPM GENERATOR REMOVAL;  Surgeon: Evans Lance, MD;  Location: [place] [facility];  Service: Cardiovascular;  Laterality: N/A;   TEE WITHOUT CARDIOVERSION N/A 12/11/2020   Procedure: TRANSESOPHAGEAL ECHOCARDIOGRAM (TEE);  Surgeon: Geralynn Rile, MD;  Location: [place];  Service: Cardiovascular;  Laterality: N/A;   TRIGGER FINGER RELEASE  2011   4th finger left hand   VIDEO ASSISTED THORACOSCOPY Left 12/26/2016   Procedure: VIDEO ASSISTED THORACOSCOPY;  Surgeon: [name], MD;  Location: [place];  Service: Thoracic;  Laterality: Left;   VIDEO BRONCHOSCOPY N/A 12/26/2016   Procedure: VIDEO BRONCHOSCOPY;  Surgeon: [name], MD;  Location: [place];  Service: Thoracic;  Laterality: N/A;   Patient Active Problem List   Diagnosis Date Noted   Intermediate stage nonexudative age-related macular degeneration of both eyes 04/25/2022   Research study patient 12/24/2021   Post-operative pain    CKD (chronic kidney disease), stage II    Labile blood glucose    Pulmonary fibrosis ([place])    Hyponatremia    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[place]) 03/26/2021   S/P BKA (below knee amputation) unilateral, right ([place])    Shortness of breath    Acute blood loss anemia    Stage 3 chronic kidney disease ([place])    Atrial fibrillation ([place])    Controlled type 2 diabetes mellitus with hyperglycemia, with long-term current use of insulin ([facility])    Postoperative pain    Postoperative hemorrhagic shock 03/20/2021   Atherosclerosis of native arteries of extremities  with gangrene, right leg ([place])    Hardware complicating wound infection ([place])    Abscess of bursa of right ankle 02/15/2021   Endocarditis 01/19/2021   Peripheral vascular disease ([place]) 12/29/2020   CHF (congestive heart failure), NYHA class II, chronic, diastolic ([place]) 73/22/0254   History of COVID-19 12/22/2020   SSS (sick sinus syndrome) ([place]) 12/22/2020   Acute bacterial endocarditis    MSSA bacteremia 12/09/2020   Diabetic foot ulcer ([place]) 12/09/2020   Foot drop, right foot    Generalized weakness 12/08/2020   Dehydration with hyponatremia 12/08/2020   Atrial fibrillation, chronic ([place]) 12/08/2020   Elevated troponin level not due myocardial infarction 12/08/2020   Adrenal insufficiency ([place]) 11/14/2020  Orthostatic hypotension 11/14/2020   Physical deconditioning 11/14/2020   Difficulty sleeping 11/07/2020   Postoperative anemia due to acute blood loss 11/07/2020   Right ventricular dysfunction 11/05/2020   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/04/2020   Hearing loss 11/03/2020   Cardiac device in situ 09/09/2020   Coronary atherosclerosis due to lipid rich plaque 09/02/2020   COVID-19 virus infection 07/18/2020   Coronary artery disease involving native heart without angina pectoris 07/10/2020   Chronic kidney disease, stage 3a ([place]) 03/29/2020   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/28/2020   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 ([place]) 03/16/2020   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 ([place]) 03/16/2020   Right epiretinal membrane 03/16/2020   Posterior vitreous detachment of right eye 03/16/2020   Aortic stenosis, moderate 03/12/2020   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   Near syncope 04/27/2018   Hypoglycemia due to insulin 01/06/2018   Essential hypertension 01/06/2018   Diabetes mellitus type 2, with complication, on long term insulin pump ([place]) 01/06/2018   Syncope and  collapse 01/05/2018   Encounter for therapeutic drug monitoring 06/29/2017   OSA on CPAP 05/05/2017   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[place]) 01/05/2017   Abnormal chest x-ray 10/12/2016   Benign neoplasm of colon 01/14/2013    REFERRING DIAG: Z89.511 (ICD-10-CM) - Hx of right BKA   ONSET DATE: 05/04/2022 MD visit with release to WB on LLE  THERAPY DIAG:  Unsteadiness on feet  Other abnormalities of gait and mobility  Muscle weakness (generalized)  Abnormal posture  History of falling  Rationale for Evaluation and Treatment Rehabilitation  PERTINENT HISTORY: CKD st III, AV block Mobitz, pacemaker, pulmonary fibrosis, CAD, CABG 21, HTN, A-Fib, DM2, PAD, right BKA, Left TMA  PRECAUTIONS: ICD/Pacemaker - continue monitoring vitals with activity and calculated HRmax is 151 bpm  SUBJECTIVE: He is doing well, no significant changes since last appointment yesterday. He is practicing standing balance at home in corner and at bed, standing to brush teeth, doing counter exercises.  PAIN:    Are you having pain? Yes: NPRS scale: today 0/10, over last week lowest 0/10  Pain location: left shoulder lateral just distal to joint Pain description: tingling when shoulder in improper posture. Aggravating factors: improper posture. Relieving factors: correcting posture   OBJECTIVE: (objective measures completed at initial evaluation unless otherwise dated)  COGNITION: Overall cognitive status: Within functional limits for tasks assessed   POSTURE: 05/09/2022:  rounded shoulders, forward head, and flexed trunk    LOWER EXTREMITY ROM:    UPPER EXTREMITY ROM:  ROM P:passive  A:active Left 06/23/22 Supine all limited by pain Left 08/01/22 Seated All painfree Right 08/01/22 Seated  Shoulder flexion 30* A: 148 A: 153  Shoulder extension 0* neutral A: 72 A: 74  Shoulder abduction 45* A: 135 A: 141  Shoulder adduction     Shoulder internal rotation 20* A: 84 A: 89  Shoulder external  rotation 10* A: 88 A: 83  Elbow flexion     Elbow extension -15*    Wrist flexion     Wrist extension     Wrist ulnar deviation     Wrist radial deviation     Wrist pronation     Wrist supination      (Blank rows = not tested)    ROM P:passive  A:active Right Eval 05/09/2022 Left Eval 05/09/2022 Bilateral 08/03/22  Hip flexion  Hip extension A: -25* standing A: -25* standing A: -15*  RW support standing  Hip abduction       Hip adduction       Hip internal rotation       Hip external rotation       Knee flexion       Knee extension       Ankle dorsiflexion NA P: -17*   Ankle plantarflexion NA     Ankle inversion NA     Ankle eversion NA      (Blank rows = not tested)   LOWER EXTREMITY MMT:   MMT Right Eval 05/09/2022 Left Eval 05/09/2022  Hip flexion      Hip extension Functional testing 3-/5 Functional testing 3-/5  Hip abduction Functional testing 3-/5 Functional testing 3-/5  Hip adduction      Hip internal rotation      Hip external rotation      Knee flexion Functional testing 3-/5 Functional testing 3-/5  Knee extension Functional testing 3/5 Functional testing 3/5  Ankle dorsiflexion      Ankle plantarflexion      Ankle inversion      Ankle eversion      (Blank rows = not tested)   POSTURE:  06/23/2022  seated - head forward, rounded shoulders and posterior pelvic tilt  TRANSFERS: 05/09/2022:  Sit to stand: SBA and requires use of armrests from 20" stable w/c & needs RW for stabilization 05/09/2022:  Stand to sit: SBA and requires use of armrests from 20" stable w/c & needs RW for stabilization   GAIT: 08/11/2022: Pt amb 100' with rollator, right TTA prosthesis & left AFO partial shoe filler with SBA. He demonstrates increased step length bil., intermittent impaired foot clearance on RLE  08/03/2022: 2-minute Walk Test with RW, right TTA prosthesis & left AFO partial shoe filler. Resting pre HR 70 SpO2 100% HR after activity HR 52  SpO2 100% light amount  of light headness Distance 158' with supervision.   Gait pattern: step-to pattern, but distance improved since evaluation, Right hip hike, trunk flexed, and normal 4" BOS  Pt neg ramp & curb with RW, ight TTA prosthesis & left AFO partial shoe filler with supervision. He required seated rest half way due to fatigue.    05/09/2022:  Gait pattern: step to pattern, decreased step length- Left, decreased stance time- Right, Right hip hike, antalgic, lateral hip instability, trunk flexed, and wide BOS Distance walked: 25' Assistive device utilized: Environmental consultant - 2 wheeled and TTA prosthesis RLE Level of assistance: Min A Comments: pt reports dizziness with turning in gait   UE FUNCTION: 06/23/2022:  Pt reports that he uses RUE for fine motor like writing but uses both hands for gross motor or sports.  Seated - pt unable to raise LUE in flexion or abd due to pain.  Supine - pt unable to move LUE in flexion or abd due to pain.  PROM flexion > pain than abd   Pt tolerates elbow ext with palm supinated but with palm neutral causes shoulder pain. Also painful with elbow already extended and pronate palm.   PT simulated pushing on RW thru LUE with manual resistance.  Pt reports slight increase in pain but not as sharp as above function.    FUNCTIONAL TESTs:  Merrilee Jansky Balance Scale: 8/302023:  13/56 & tasks of Berg with RW support 45/56     Berg Balance Scale: 05/09/2022:  9/56 & tasks of Berg with RW support 27/56  [facility] PT Assessment - 05/09/22 0930                Berg Balance Test    Sit to Stand Needs moderate or maximal assist to stand   task with RW support = 3    Standing Unsupported Unable to stand 30 seconds unassisted   task with RW support = 3    Sitting with Back Unsupported but Feet Supported on Floor or Stool Able to sit safely and securely 2 minutes     Stand to Sit Uses backs of legs against chair to control descent   task with RW support = 3    Transfers Able to transfer safely, definite  need of hands     Standing Unsupported with Eyes Closed Needs help to keep from falling   task with RW support = 3    Standing Unsupported with Feet Together Needs help to attain position and unable to hold for 15 seconds   task with RW support = 2    From Standing, Reach Forward with Outstretched Arm Loses balance while trying/requires external support   task with RW support = 1    From Standing Position, Pick up Object from Floor Unable to try/needs assist to keep balance   task with RW support = 2    From Standing Position, Turn to Look Behind Over each Shoulder Needs assist to keep from losing balance and falling   task with RW support = 1    Turn 360 Degrees Needs assistance while turning   task with RW support = 0    Standing Unsupported, Alternately Place Feet on Step/Stool Needs assistance to keep from falling or unable to try   task with RW support = 0    Standing Unsupported, One Foot in [employer] balance while stepping or standing   task with RW support = 1    Standing on One Leg Unable to try or needs assist to prevent fall   task with RW support = 1    Total Score 9     Berg comment: tasks of Berg with RW support 27/56                     CURRENT PROSTHETIC WEAR ASSESSMENT: 08/03/2022: Pt wears both right TTA prosthesis & left AFO partial shoe filler>90% of awake hours.  He has 2 wounds on dorsum of left foot being monitored by PCP.  He is wearing left Vive Wear compression sock below knee on LLE which has helped wounds to heal. Pt is independent in donning & doffing both prosthesis & AFO. Skin check, cleaning and minor cues on adjusting ply socks.   05/09/2022:   Patient is dependent with: skin check, residual limb care, care of non-amputated limb, prosthetic cleaning, ply sock cleaning, and correct ply sock adjustment Donning prosthesis: Modified independence Doffing prosthesis: Modified independence Prosthetic wear tolerance: >90% of awake hours/day, 7  days/week Prosthetic weight bearing tolerance: 5 minutes Edema: LLE pitting edema with >10 sec capillary refill Residual limb condition: Right BKA limb has no openings, normal temperature & moisture.  Redness patella & patella tendon from friction of liner with knee flexed. Cylinderical shape.  LLE TMA pt's wife showed picture of wound on dorsum of foot that is being managed by PCP.   Prosthetic description: silicon liner with pin lock suspension, total contact socket, dynamic response foot.      TODAY'S TREATMENT:  08/11/2022 Vitals:  -following 100' walk and STS  against wall: pt reported SOB and SpO2 93% -following ramp and STS not against wall: pt reported SOB and SpO2 91% HR 77 bpm  -after 1 min: SpO2 93% HR 62 bpm  -after 3 min: SpO2 95% HR 60 bpm  Prosthetic Training with right TTA prosthesis & left dynamic AFO and custom shoe with toe filler and carbon plate: PT visual and verbal explanation of safe use of rollator for mobility, STS, ramp, and curb with additional recommendations for prosthesis management with STS and ramp/curb - pt returned demo Pt amb 100', 20', 30' with rollator and SBA, verbal cueing for reduced step length and speed, using brakes Pt sit to/from stand with rollator against wall with SBA, sit to/from stand with rollator away from wall with minA to control rollator Pt sit to/from stand in chair without armrests x1 SBA and x1 minA for ascent, SBA descent Pt ascend and descend curb with rollator and SBA Pt ascend ramp SBA and descend ramp with CGA with one instance of Rt knee buckling, Rt brake came unlocked during ramp negotiation  08/10/2022 Vitals: (HR taken manually at Lt radial pulse) -at rest: HR 64 bpm -following standing balance tasks with RW before cabinet: HR 103 bpm, SpO2 98% -following overhead reaching to cabinet shelves: HR 84 bpm -following ramp and curb: HR 105 bpm  Neuro Re-ed: Static stance x 45s, required frequent UE support on RW Mod. Tandem  with partial step forward x 30s bil., required nearly constant UE support on RW and 1 instance of minA to recover balance ea. Standing marching with BUE support on RW Alternating looking over shoulder x 6 ea. with single UE support on RW Alternating single UE anterior reach to laterally move object x 6 ea. with RW Overhead reaching to cabinet shelves bil. with single UE and RW support, showed increased sway and instability with LUE reaching  Prosthetic Training with right TTA prosthesis & left dynamic AFO and custom shoe with toe filler and carbon plate: Pt amb 30', then 19' inc ascended and descended ramp and curb with RW with SBA  08/03/2022 See Merrilee Jansky, Gait & prosthetic data above.   08/01/2022 TherEx: Shoulder AROM in seated without back support as noted in objective  Prosthetic Training with right TTA prosthesis & left dynamic AFO and custom shoe with toe filler and carbon plate: PT educated that right prosthesis & left forefoot do not have proprioception in non-weight bearing situations so he has to visually watch clearance like on his scooter. Pt & wife verbalized understanding.  Pt ambulate 59' with RW and SBA with visual target for foot placement including ascend curb and descend ramp SBA with verbal and visual cueing for foot placement when moving walker over curb, then ambulate 80' with ascend ramp and descend curb with same assist level and cueing Vitals following first walk, ramp, and curb: SpO2 88% HR 122 bpm PT discuss plan of care and assistive device progression, increasing walking tolerance outside of PT appointments with ramp and curb, pt and wife verbalized understanding   HOME EXERCISE PROGRAM  Access Code: [email or web address] URL: [email or web address] Date: 07/05/2022 Prepared by: Jamey Reas  Exercises - Seated Correct Posture  - 1 x daily - 7 x weekly - Seated Scapular Retraction  - 2-5 x daily - 7 x weekly - 1 sets - 10 reps - 5 seconds hold - Supine Chin  Tucks on Flat Ball  - 2-5 x daily - 7 x weekly - 1 sets - 10 reps - 5 seconds hold -  Seated Isometric Cervical Retraction with Chin Tucks with Pillow behind head  - 2-5 x daily - 7 x weekly - 1 sets - 10 reps - 5 seconds hold - Circular Shoulder Pendulum with Table Support  - 1-2 x daily - 7 x weekly - 1-2 sets - 10 reps - 5 seconds hold - Flexion-Extension Shoulder Pendulum with Table Support  - 1-2 x daily - 7 x weekly - 1-2 sets - 10 reps - Horizontal Shoulder Pendulum with Table Support  - 1-2 x daily - 7 x weekly - 1-2 sets - 10 reps - Supine Shoulder Flexion Extension AAROM with Dowel  - 1 x daily - 7 x weekly - 1-2 sets - 10 reps - 5 seconds hold - Supine Shoulder Abduction AAROM with Dowel  - 1 x daily - 7 x weekly - 1-2 sets - 10 reps - 5 seconds hold - Supine Shoulder External Rotation with Dowel  - 1 x daily - 7 x weekly - 1-2 sets - 10 reps - 5 seconds hold   Neuromuscular Reed-  cues on proprioception RLE using residual limb, balance & standing tolerance.   Do each exercise 1-2  times per day Do each exercise 5-10 repetitions Hold each exercise for 2 seconds to feel your location  AT [place].  Try to find this position when standing still for activities.   USE TAPE ON FLOOR TO MARK THE MIDLINE POSITION which is even with middle of sink.  You also should try to feel with your limb pressure in socket.  You are trying to feel with limb what you used to feel with the bottom of your foot.  Side to Side Shift: Moving your hips only (not shoulders): move weight onto your left leg, HOLD/FEEL pressure in socket.  Move back to equal weight on each leg, HOLD/FEEL pressure in socket. Move weight onto your right leg, HOLD/FEEL pressure in socket. Move back to equal weight on each leg, HOLD/FEEL pressure in socket. Repeat.  Start with both hands on sink, progress to hand on prosthetic side only, then no hands.  Front to Back  Shift: Moving your hips only (not shoulders): move your weight forward onto your toes, HOLD/FEEL pressure in socket. Move your weight back to equal Flat Foot on both legs, HOLD/FEEL  pressure in socket. Move your weight back onto your heels, HOLD/FEEL  pressure in socket. Move your weight back to equal on both legs, HOLD/FEEL  pressure in socket. Repeat.  Start with both hands on sink, progress to hand on prosthetic side only, then no hands.  Moving Cones / Cups: With equal weight on each leg: Hold on with one hand the first time, then progress to no hand supports. Move cups from one side of sink to the other. Place cups ~2" out of your reach, progress to 10" beyond reach.  Place one hand in middle of sink and reach with other hand. Do both arms.  Then hover one hand and move cups with other hand.  Overhead/Upward Reaching: alternated reaching up to top cabinets or ceiling if no cabinets present. Keep equal weight on each leg. Start with one hand support on counter while other hand reaches and progress to no hand support with reaching.  ace one hand in middle of sink and reach with other hand. Do both arms.  Then hover one hand and move cups with other hand.  5.   Looking Over  Shoulders: With equal weight on each leg: alternate turning to look over your shoulders with one hand support on counter as needed.  Start with head motions only to look in front of shoulder, then even with shoulder and progress to looking behind you. To look to side, move head /eyes, then shoulder on side looking pulls back, shift more weight to side looking and pull hip back. Place one hand in middle of sink and let go with other hand so your shoulder can pull back. Switch hands to look other way.   Then hover one hand and look over shoulder. If looking right, use left hand at sink. If looking left, use right hand at sink. 6.  Alternate Tapping Toes or Stepping to lower shelf of cabinet  Move items under cabinet out of your way. Shift  your hips/pelvis so weight on stance leg & Tighten muscles in hip.  SLOWLY step other leg so front of foot is in cabinet. Then step back to floor. Repeat with other leg.     ASSESSMENT:   CLINICAL IMPRESSION: Today session included instruction and practice of gait, turn to sit/stand, ramp, and curb with a rollator as he is presenting with improved balance and improved activity tolerance. He required supervision for all tasks with rollator with the exception of descending a ramp and sitting at rollator with it away from a wall, so those skills will require further practice with reinforcement of rollator use. He appears safe to use the rollator at home when the seat is parked against a wall or if he is sitting in another chair, and he and his wife were advised to inspect rollator and brakes for use at home. He continues to benefit form skilled PT.  OBJECTIVE IMPAIRMENTS .Abnormal gait, cardiopulmonary status limiting activity, decreased activity tolerance, decreased balance, decreased endurance, decreased knowledge of use of DME, decreased mobility, decreased ROM, decreased strength, dizziness, increased edema, impaired flexibility, postural dysfunction, prosthetic dependency , obesity, and pain.    ACTIVITY LIMITATIONS carrying, lifting, bending, standing, stairs, transfers, locomotion level, UE use with ADLs, sleep    PARTICIPATION LIMITATIONS: driving, community activity, and yard work, UE use with ADLs &  household activities   [place], Time since onset of injury/illness/exacerbation, and 3+ comorbidities: see PMH  are also affecting patient's functional outcome.    REHAB POTENTIAL: Good   CLINICAL DECISION MAKING: Evolving/moderate complexity   EVALUATION COMPLEXITY: Moderate     GOALS: Goals reviewed with patient? Yes  SHORT TERM GOALS: Target date: 09/01/2022  1. Patient able to stand with no UE support for 30 seconds with supervision. Baseline: SEE OBJECTIVE  DATA Goal status: updated 08/03/22   2. Patient able to stand & sit from 18" without armrests using his UEs to RW safely Baseline: SEE OBJECTIVE DATA Goal status: NEW at recert  3. Patient ambulates up to 200' with RW, prosthesis & AFO with supervision.  Goal status: updated at recert 1/61/0960  4. Patient is able to negotiate a ramp & curb with RW with wife's supervision to enable basic community access without w/c.  Goal status: updated at recert 4/54/0981     UPDATED LONG TERM GOALS: Target date: 10/26/2022   Patient demonstrates & verbalized understanding of prosthetic care to enable safe utilization of prosthesis. Baseline: SEE OBJECTIVE DATA Goal status: ongoing 08/03/22    Patient tolerates right prosthesis & left AFO / partial foot prosthesis wear >90% of awake hours without skin or limb pain issues. Baseline: SEE OBJECTIVE DATA Goal  status: ongoing 08/03/22    Merrilee Jansky Balance >/= 20/56 and tasks of [place] with RW support >/= 52/56 to indicate lower fall risk with standing ADLs Baseline: SEE OBJECTIVE DATA Goal status: 08/03/2022 updated    Patient ambulates >300' with prosthesis & LRAD modified independent Baseline: SEE OBJECTIVE DATA Goal status: ongoing 08/03/2022   Patient negotiates ramps, curbs & stairs with single rail with prosthesis & LRAD modified independent. Baseline: SEE OBJECTIVE DATA Goal status: ongoing 08/03/22  PLAN: PT FREQUENCY: 2x/week   PT DURATION: 12 weeks  PLANNED INTERVENTIONS: Therapeutic exercises, Therapeutic activity, Neuromuscular re-education, Balance training, Gait training, Patient/Family education, Self Care, Stair training, Vestibular training, Canalith repositioning, Prosthetic training, DME instructions, Re-evaluation, and physical performance testing    PLAN FOR NEXT SESSION: continue to monitor HR & SpO2 with activities. Rollator STS slightly away from wall and ramp, curb for retention   Jana Hakim, Student-PT 08/11/2022, 9:58  AM  This entire session of physical therapy was performed under the direct supervision of PT signing evaluation /treatment. PT reviewed note and agrees.  Jamey Reas, PT, DPT 08/11/2022, 1:12 PM

## 2022-08-11 ENCOUNTER — Encounter: Payer: Self-pay | Admitting: Physical Therapy

## 2022-08-11 ENCOUNTER — Ambulatory Visit (INDEPENDENT_AMBULATORY_CARE_PROVIDER_SITE_OTHER): Payer: Medicare Other | Admitting: Physical Therapy

## 2022-08-11 DIAGNOSIS — R2689 Other abnormalities of gait and mobility: Secondary | ICD-10-CM | POA: Diagnosis not present

## 2022-08-11 DIAGNOSIS — R2681 Unsteadiness on feet: Secondary | ICD-10-CM

## 2022-08-11 DIAGNOSIS — M6281 Muscle weakness (generalized): Secondary | ICD-10-CM

## 2022-08-11 DIAGNOSIS — R293 Abnormal posture: Secondary | ICD-10-CM

## 2022-08-11 DIAGNOSIS — Z9181 History of falling: Secondary | ICD-10-CM

## 2022-08-12 ENCOUNTER — Ambulatory Visit: Payer: Medicare Other

## 2022-08-15 NOTE — Therapy (Signed)
OUTPATIENT PHYSICAL THERAPY TREATMENT NOTE   Patient Name: Shane [name] MRN: [id number] DOB:1941/08/06, 81 y.o., male Today's Date: 08/16/2022  PCP: Burnard Bunting, MD REFERRING PROVIDER: [name], MD for TTA    END OF SESSION:   PT End of Session - 08/16/22 0801     Visit Number 26    Number of Visits 47    Date for PT Re-Evaluation 10/26/22    Authorization Type UHC Medicare    Authorization Time Period UHC MEDICARE  $20 COPAY    Progress Note Due on Visit 67    PT Start Time 0800    PT Stop Time 0843    PT Time Calculation (min) 43 min    Equipment Utilized During Treatment Gait belt    Activity Tolerance Patient tolerated treatment well;Patient limited by fatigue    Behavior During Therapy WFL for tasks assessed/performed             Past Medical History:  Diagnosis Date   Anxiety    AV block, Mobitz 1    Cataract    Chronic kidney disease    d/t DM   CKD (chronic kidney disease), stage III ([place])    Depression    Diabetes mellitus    Vgo disposal insulin bolus  simular to insulin pump   Dyspnea    GERD (gastroesophageal reflux disease)    History of kidney stones    passed   Hyperlipidemia    Hypertension    Idiopathic pulmonary fibrosis ([place]) 11/2016   ILD (interstitial lung disease) ([place])    Moderate aortic stenosis    a. 10/2019 Echo: EF 55-60%, Gr2 DD. Nl RV.    Neuromuscular disorder ([place])    Neuropathy associated with endocrine disorder ([place])    Nonobstructive CAD (coronary artery disease)    a. 2012 Cath: mod, nonobs dzs; b. 10/2016 MV: EF 60%, no ischemia.   OSA on CPAP 05/05/2017   Unattended Home Sleep Test 7/2/813-AHI 38.6/hour, desaturation to 64%, body weight 261 pounds   PONV (postoperative nausea and vomiting)    Sleep apnea     uses cpap asked to bring mask and tubing   Past Surgical History:  Procedure Laterality Date   ABDOMINAL AORTOGRAM W/LOWER EXTREMITY N/A 12/10/2020   Procedure: ABDOMINAL AORTOGRAM W/LOWER  EXTREMITY;  Surgeon: Cherre Robins, MD;  Location: [facility];  Service: Cardiovascular;  Laterality: N/A;   AMPUTATION Right 01/22/2021   Procedure: 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;  Surgeon: [name], MD;  Location: [facility];  Service: Orthopedics;  Laterality: Right;   AMPUTATION Right 03/17/2021   Procedure: RIGHT BELOW KNEE AMPUTATION;  Surgeon: [name], MD;  Location: [facility];  Service: Orthopedics;  Laterality: Right;   ANKLE FUSION Right 01/22/2021   Procedure: RIGHT FOOT TIBIOCALCANEAL FUSION;  Surgeon: [name], MD;  Location: [facility];  Service: Orthopedics;  Laterality: Right;   ANTERIOR FUSION CERVICAL SPINE  2012   CARDIAC CATHETERIZATION  2011   CARDIAC CATHETERIZATION N/A 11/09/2016   Procedure: Right Heart Cath;  Surgeon: Belva Crome, MD;  Location: [facility];  Service: Cardiovascular;  Laterality: N/A;   carpel tunnel     left wrist   CATARACT EXTRACTION     CATARACT EXTRACTION W/ INTRAOCULAR LENS  IMPLANT, BILATERAL  2013   CERVICAL LAMINECTOMY  2012   COLONOSCOPY N/A 01/14/2013   Procedure: COLONOSCOPY;  Surgeon: Irene Shipper, MD;  Location: [facility];  Service: Endoscopy;  Laterality:  N/A;   CORONARY ARTERY BYPASS GRAFT  11/04/2020   LIMA-LAD, SVG-OM1, SVG-PDA (Dr Marney [facility]) dc 11/18/2020   EYE SURGERY     I & D EXTREMITY Right 02/19/2021   Procedure: RIGHT ANKLE DEBRIDEMENT AND PLACEMENT ANTIBIOTIC BEADS;  Surgeon: [name], MD;  Location: [facility];  Service: Orthopedics;  Laterality: Right;   KNEE SURGERY  1998   left   LEFT HEART CATH AND CORONARY ANGIOGRAPHY N/A 07/10/2020   Procedure: LEFT HEART CATH AND CORONARY ANGIOGRAPHY;  Surgeon: Sherren Mocha, MD;  Location: [facility];  Service: Cardiovascular;  Laterality: N/A;   LUMBAR LAMINECTOMY  2003   LUNG BIOPSY Left 12/26/2016   Procedure: LUNG BIOPSY;  Surgeon: [name], MD;  Location: [facility];  Service: Thoracic;  Laterality: Left;   PACEMAKER IMPLANT N/A  03/30/2020   Procedure: PACEMAKER IMPLANT;  Surgeon: Evans Lance, MD;  Location: [facility];  Service: Cardiovascular;  Laterality: N/A;   PERIPHERAL VASCULAR INTERVENTION Right 12/10/2020   Procedure: PERIPHERAL VASCULAR INTERVENTION;  Surgeon: Cherre Robins, MD;  Location: [facility];  Service: Cardiovascular;  Laterality: Right;  SFA   POSTERIOR FUSION CERVICAL SPINE  2012   PPM GENERATOR REMOVAL N/A 12/14/2020   Procedure: PPM GENERATOR REMOVAL;  Surgeon: Evans Lance, MD;  Location: [facility];  Service: Cardiovascular;  Laterality: N/A;   TEE WITHOUT CARDIOVERSION N/A 12/11/2020   Procedure: TRANSESOPHAGEAL ECHOCARDIOGRAM (TEE);  Surgeon: Geralynn Rile, MD;  Location: [facility];  Service: Cardiovascular;  Laterality: N/A;   TRIGGER FINGER RELEASE  2011   4th finger left hand   VIDEO ASSISTED THORACOSCOPY Left 12/26/2016   Procedure: VIDEO ASSISTED THORACOSCOPY;  Surgeon: [name], MD;  Location: [facility];  Service: Thoracic;  Laterality: Left;   VIDEO BRONCHOSCOPY N/A 12/26/2016   Procedure: VIDEO BRONCHOSCOPY;  Surgeon: [name], MD;  Location: [facility];  Service: Thoracic;  Laterality: N/A;   Patient Active Problem List   Diagnosis Date Noted   Intermediate stage nonexudative age-related macular degeneration of both eyes 04/25/2022   Research study patient 12/24/2021   Post-operative pain    CKD (chronic kidney disease), stage II    Labile blood glucose    Pulmonary fibrosis ([place])    Hyponatremia    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[place]) 03/26/2021   S/P BKA (below knee amputation) unilateral, right ([place])    Shortness of breath    Acute blood loss anemia    Stage 3 chronic kidney disease ([place])    Atrial fibrillation ([place])    Controlled type 2 diabetes mellitus with hyperglycemia, with long-term current use of insulin ([facility])    Postoperative pain    Postoperative hemorrhagic shock 03/20/2021   Atherosclerosis of native arteries of  extremities with gangrene, right leg ([place])    Hardware complicating wound infection ([place])    Abscess of bursa of right ankle 02/15/2021   Endocarditis 01/19/2021   Peripheral vascular disease ([place]) 12/29/2020   CHF (congestive heart failure), NYHA class II, chronic, diastolic ([place]) 27/05/2375   History of COVID-19 12/22/2020   SSS (sick sinus syndrome) ([place]) 12/22/2020   Acute bacterial endocarditis    MSSA bacteremia 12/09/2020   Diabetic foot ulcer ([place]) 12/09/2020   Foot drop, right foot    Generalized weakness 12/08/2020   Dehydration with hyponatremia 12/08/2020   Atrial fibrillation, chronic ([place]) 12/08/2020   Elevated troponin level not due myocardial infarction 12/08/2020   Adrenal insufficiency ([place]) 11/14/2020  Orthostatic hypotension 11/14/2020   Physical deconditioning 11/14/2020   Difficulty sleeping 11/07/2020   Postoperative anemia due to acute blood loss 11/07/2020   Right ventricular dysfunction 11/05/2020   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/04/2020   Hearing loss 11/03/2020   Cardiac device in situ 09/09/2020   Coronary atherosclerosis due to lipid rich plaque 09/02/2020   COVID-19 virus infection 07/18/2020   Coronary artery disease involving native heart without angina pectoris 07/10/2020   Chronic kidney disease, stage 3a ([place]) 03/29/2020   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/28/2020   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 ([place]) 03/16/2020   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 ([place]) 03/16/2020   Right epiretinal membrane 03/16/2020   Posterior vitreous detachment of right eye 03/16/2020   Aortic stenosis, moderate 03/12/2020   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   Near syncope 04/27/2018   Hypoglycemia due to insulin 01/06/2018   Essential hypertension 01/06/2018   Diabetes mellitus type 2, with complication, on long term insulin pump ([place]) 01/06/2018    Syncope and collapse 01/05/2018   Encounter for therapeutic drug monitoring 06/29/2017   OSA on CPAP 05/05/2017   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[place]) 01/05/2017   Abnormal chest x-ray 10/12/2016   Benign neoplasm of colon 01/14/2013    REFERRING DIAG: Z89.511 (ICD-10-CM) - Hx of right BKA   ONSET DATE: 05/04/2022 MD visit with release to [facility] on LLE  THERAPY DIAG:  Unsteadiness on feet  Other abnormalities of gait and mobility  Muscle weakness (generalized)  Abnormal posture  History of falling  Rationale for Evaluation and Treatment Rehabilitation  PERTINENT HISTORY: CKD st III, AV block Mobitz, pacemaker, pulmonary fibrosis, CAD, CABG 21, HTN, A-Fib, DM2, PAD, right BKA, Left TMA  PRECAUTIONS: ICD/Pacemaker - continue monitoring vitals with activity and calculated HRmax is 151 bpm  SUBJECTIVE: He says the wound is about to close up and the NP notes it is getting slowly better every time. He bought a large wheeled rollator with the intent to use it for outside surfaces and reports that it is very large and heavy, currently too large for the household.  PAIN:    Are you having pain? Yes: NPRS scale: today 0/10, over last week lowest 0/10  Pain location: left shoulder lateral just distal to joint Pain description: tingling when shoulder in improper posture. Aggravating factors: improper posture. Relieving factors: correcting posture   OBJECTIVE: (objective measures completed at initial evaluation unless otherwise dated)  COGNITION: Overall cognitive status: Within functional limits for tasks assessed   POSTURE: 05/09/2022:  rounded shoulders, forward head, and flexed trunk    LOWER EXTREMITY ROM:    UPPER EXTREMITY ROM:  ROM P:passive  A:active Left 06/23/22 Supine all limited by pain Left 08/01/22 Seated All painfree Right 08/01/22 Seated  Shoulder flexion 30* A: 148 A: 153  Shoulder extension 0* neutral A: 72 A: 74  Shoulder abduction 45* A: 135 A: 141   Shoulder adduction     Shoulder internal rotation 20* A: 84 A: 89  Shoulder external rotation 10* A: 88 A: 83  Elbow flexion     Elbow extension -15*     (Blank rows = not tested)    ROM P:passive  A:active Right Eval 05/09/2022 Left Eval 05/09/2022 Bilateral 08/03/22  Hip flexion       Hip extension A: -25* standing A: -25* standing A: -15*  RW support standing  Hip abduction  Hip adduction       Hip internal rotation       Hip external rotation       Knee flexion       Knee extension       Ankle dorsiflexion NA P: -17*   Ankle plantarflexion NA     Ankle inversion NA     Ankle eversion NA      (Blank rows = not tested)   LOWER EXTREMITY MMT:   MMT Right Eval 05/09/2022 Left Eval 05/09/2022  Hip flexion      Hip extension Functional testing 3-/5 Functional testing 3-/5  Hip abduction Functional testing 3-/5 Functional testing 3-/5  Hip adduction      Hip internal rotation      Hip external rotation      Knee flexion Functional testing 3-/5 Functional testing 3-/5  Knee extension Functional testing 3/5 Functional testing 3/5  Ankle dorsiflexion      Ankle plantarflexion      Ankle inversion      Ankle eversion      (Blank rows = not tested)   POSTURE:  06/23/2022  seated - head forward, rounded shoulders and posterior pelvic tilt  TRANSFERS: 05/09/2022:  Sit to stand: SBA and requires use of armrests from 20" stable w/c & needs RW for stabilization 05/09/2022:  Stand to sit: SBA and requires use of armrests from 20" stable w/c & needs RW for stabilization   GAIT: 08/11/2022: Pt amb 100' with rollator, right TTA prosthesis & left AFO partial shoe filler with SBA. He demonstrates increased step length bil., intermittent impaired foot clearance on RLE  08/03/2022: 2-minute Walk Test with RW, right TTA prosthesis & left AFO partial shoe filler. Resting pre HR 70 SpO2 100% HR after activity HR 52  SpO2 100% light amount of light headness Distance 158' with  supervision.   Gait pattern: step-to pattern, but distance improved since evaluation, Right hip hike, trunk flexed, and normal 4" BOS  Pt neg ramp & curb with RW, ight TTA prosthesis & left AFO partial shoe filler with supervision. He required seated rest half way due to fatigue.    05/09/2022:  Gait pattern: step to pattern, decreased step length- Left, decreased stance time- Right, Right hip hike, antalgic, lateral hip instability, trunk flexed, and wide BOS Distance walked: 25' Assistive device utilized: Environmental consultant - 2 wheeled and TTA prosthesis RLE Level of assistance: Min A Comments: pt reports dizziness with turning in gait   UE FUNCTION: 06/23/2022:  Pt reports that he uses RUE for fine motor like writing but uses both hands for gross motor or sports.  Seated - pt unable to raise LUE in flexion or abd due to pain.  Supine - pt unable to move LUE in flexion or abd due to pain.  PROM flexion > pain than abd   Pt tolerates elbow ext with palm supinated but with palm neutral causes shoulder pain. Also painful with elbow already extended and pronate palm.   PT simulated pushing on RW thru LUE with manual resistance.  Pt reports slight increase in pain but not as sharp as above function.    FUNCTIONAL TESTs:  Merrilee Jansky Balance Scale: 8/302023:  13/56 & tasks of Berg with RW support 45/56     Berg Balance Scale: 05/09/2022:  9/56 & tasks of Berg with RW support 27/56    [facility] PT Assessment - 05/09/22 0930  Berg Balance Test    Sit to Stand Needs moderate or maximal assist to stand   task with RW support = 3    Standing Unsupported Unable to stand 30 seconds unassisted   task with RW support = 3    Sitting with Back Unsupported but Feet Supported on Floor or Stool Able to sit safely and securely 2 minutes     Stand to Sit Uses backs of legs against chair to control descent   task with RW support = 3    Transfers Able to transfer safely, definite need of hands     Standing  Unsupported with Eyes Closed Needs help to keep from falling   task with RW support = 3    Standing Unsupported with Feet Together Needs help to attain position and unable to hold for 15 seconds   task with RW support = 2    From Standing, Reach Forward with Outstretched Arm Loses balance while trying/requires external support   task with RW support = 1    From Standing Position, Pick up Object from Floor Unable to try/needs assist to keep balance   task with RW support = 2    From Standing Position, Turn to Look Behind Over each Shoulder Needs assist to keep from losing balance and falling   task with RW support = 1    Turn 360 Degrees Needs assistance while turning   task with RW support = 0    Standing Unsupported, Alternately Place Feet on Step/Stool Needs assistance to keep from falling or unable to try   task with RW support = 0    Standing Unsupported, One Foot in [employer] balance while stepping or standing   task with RW support = 1    Standing on One Leg Unable to try or needs assist to prevent fall   task with RW support = 1    Total Score 9     Berg comment: tasks of Berg with RW support 27/56                     CURRENT PROSTHETIC WEAR ASSESSMENT: 08/03/2022: Pt wears both right TTA prosthesis & left AFO partial shoe filler>90% of awake hours.  He has 2 wounds on dorsum of left foot being monitored by PCP.  He is wearing left Vive Wear compression sock below knee on LLE which has helped wounds to heal. Pt is independent in donning & doffing both prosthesis & AFO. Skin check, cleaning and minor cues on adjusting ply socks.   05/09/2022:   Patient is dependent with: skin check, residual limb care, care of non-amputated limb, prosthetic cleaning, ply sock cleaning, and correct ply sock adjustment Donning prosthesis: Modified independence Doffing prosthesis: Modified independence Prosthetic wear tolerance: >90% of awake hours/day, 7 days/week Prosthetic weight bearing tolerance:  5 minutes Edema: LLE pitting edema with >10 sec capillary refill Residual limb condition: Right BKA limb has no openings, normal temperature & moisture.  Redness patella & patella tendon from friction of liner with knee flexed. Cylinderical shape.  LLE TMA pt's wife showed picture of wound on dorsum of foot that is being managed by PCP.   Prosthetic description: silicon liner with pin lock suspension, total contact socket, dynamic response foot.      TODAY'S TREATMENT:  08/15/2022 Vitals:  -following 40' walk with ascend curb, descend ramp, and STS away from wall onto rollator seat: pt reported SOB and SpO2 94% HR 75 bpm -following  ascend ramp, descend curb & 40' walk: HR 86 bpm  Self Care: PT discussed injury risk & higher fall potential without AFO and prosthesis and encouraged constant wear even upon discharge from PT, pt verbalized understanding  Prosthetic Training with right TTA prosthesis & left dynamic AFO and custom shoe with toe filler and carbon plate: Pt amb 40', 40', 170' with rollator and SBA, frequent instances of Rt toe drag Pt ascend and descend ramp with rollator and CGA, no instances of knee buckling or losses of balance Pt ascend and descend curb with rollator and SBA and cueing for rollator management upon ascent Pt turn to sit with rollator away from wall with minA to control rollator, stand and move rollator to front of body with CGA  PT explained rollator options and asked pt to bring pictures and item description to next appointment to determine if appropriate for use at home, pt and wife verbalized understanding  08/11/2022 Vitals:  -following 100' walk and STS against wall: pt reported SOB and SpO2 93% -following ramp and STS not against wall: pt reported SOB and SpO2 91% HR 77 bpm  -after 1 min: SpO2 93% HR 62 bpm  -after 3 min: SpO2 95% HR 60 bpm  Prosthetic Training with right TTA prosthesis & left dynamic AFO and custom shoe with toe filler and carbon  plate: PT visual and verbal explanation of safe use of rollator for mobility, STS, ramp, and curb with additional recommendations for prosthesis management with STS and ramp/curb - pt returned demo Pt amb 100', 20', 30' with rollator and SBA, verbal cueing for reduced step length and speed, using brakes Pt sit to/from stand with rollator against wall with SBA, sit to/from stand with rollator away from wall with minA to control rollator Pt sit to/from stand in chair without armrests x1 SBA and x1 minA for ascent, SBA descent Pt ascend and descend curb with rollator and SBA Pt ascend ramp SBA and descend ramp with CGA with one instance of Rt knee buckling, Rt brake came unlocked during ramp negotiation  08/10/2022 Vitals: (HR taken manually at Lt radial pulse) -at rest: HR 64 bpm -following standing balance tasks with RW before cabinet: HR 103 bpm, SpO2 98% -following overhead reaching to cabinet shelves: HR 84 bpm -following ramp and curb: HR 105 bpm  Neuro Re-ed: Static stance x 45s, required frequent UE support on RW Mod. Tandem with partial step forward x 30s bil., required nearly constant UE support on RW and 1 instance of minA to recover balance ea. Standing marching with BUE support on RW Alternating looking over shoulder x 6 ea. with single UE support on RW Alternating single UE anterior reach to laterally move object x 6 ea. with RW Overhead reaching to cabinet shelves bil. with single UE and RW support, showed increased sway and instability with LUE reaching  Prosthetic Training with right TTA prosthesis & left dynamic AFO and custom shoe with toe filler and carbon plate: Pt amb 30', then 38' inc ascended and descended ramp and curb with RW with SBA   HOME EXERCISE PROGRAM  Access Code: [email or web address] URL: [email or web address] Date: 07/05/2022 Prepared by: Jamey Reas  Exercises - Seated Correct Posture  - 1 x daily - 7 x weekly - Seated Scapular Retraction  - 2-5  x daily - 7 x weekly - 1 sets - 10 reps - 5 seconds hold - Supine Chin Tucks on Flat Ball  - 2-5 x daily - 7 x weekly - 1 sets -  10 reps - 5 seconds hold - Seated Isometric Cervical Retraction with Chin Tucks with Pillow behind head  - 2-5 x daily - 7 x weekly - 1 sets - 10 reps - 5 seconds hold - Circular Shoulder Pendulum with Table Support  - 1-2 x daily - 7 x weekly - 1-2 sets - 10 reps - 5 seconds hold - Flexion-Extension Shoulder Pendulum with Table Support  - 1-2 x daily - 7 x weekly - 1-2 sets - 10 reps - Horizontal Shoulder Pendulum with Table Support  - 1-2 x daily - 7 x weekly - 1-2 sets - 10 reps - Supine Shoulder Flexion Extension AAROM with Dowel  - 1 x daily - 7 x weekly - 1-2 sets - 10 reps - 5 seconds hold - Supine Shoulder Abduction AAROM with Dowel  - 1 x daily - 7 x weekly - 1-2 sets - 10 reps - 5 seconds hold - Supine Shoulder External Rotation with Dowel  - 1 x daily - 7 x weekly - 1-2 sets - 10 reps - 5 seconds hold   Neuromuscular Reed-  cues on proprioception RLE using residual limb, balance & standing tolerance.   Do each exercise 1-2  times per day Do each exercise 5-10 repetitions Hold each exercise for 2 seconds to feel your location  AT [place].  Try to find this position when standing still for activities.   USE TAPE ON FLOOR TO MARK THE MIDLINE POSITION which is even with middle of sink.  You also should try to feel with your limb pressure in socket.  You are trying to feel with limb what you used to feel with the bottom of your foot.  Side to Side Shift: Moving your hips only (not shoulders): move weight onto your left leg, HOLD/FEEL pressure in socket.  Move back to equal weight on each leg, HOLD/FEEL pressure in socket. Move weight onto your right leg, HOLD/FEEL pressure in socket. Move back to equal weight on each leg, HOLD/FEEL pressure in socket. Repeat.  Start with both hands on sink,  progress to hand on prosthetic side only, then no hands.  Front to Back Shift: Moving your hips only (not shoulders): move your weight forward onto your toes, HOLD/FEEL pressure in socket. Move your weight back to equal Flat Foot on both legs, HOLD/FEEL  pressure in socket. Move your weight back onto your heels, HOLD/FEEL  pressure in socket. Move your weight back to equal on both legs, HOLD/FEEL  pressure in socket. Repeat.  Start with both hands on sink, progress to hand on prosthetic side only, then no hands.  Moving Cones / Cups: With equal weight on each leg: Hold on with one hand the first time, then progress to no hand supports. Move cups from one side of sink to the other. Place cups ~2" out of your reach, progress to 10" beyond reach.  Place one hand in middle of sink and reach with other hand. Do both arms.  Then hover one hand and move cups with other hand.  Overhead/Upward Reaching: alternated reaching up to top cabinets or ceiling if no cabinets present. Keep equal weight on each leg. Start with one hand support on counter while other hand reaches and progress to no hand support with reaching.  ace one hand in middle of sink and reach with other hand. Do both arms.  Then hover one hand and move cups with other  hand.  5.   Looking Over Shoulders: With equal weight on each leg: alternate turning to look over your shoulders with one hand support on counter as needed.  Start with head motions only to look in front of shoulder, then even with shoulder and progress to looking behind you. To look to side, move head /eyes, then shoulder on side looking pulls back, shift more weight to side looking and pull hip back. Place one hand in middle of sink and let go with other hand so your shoulder can pull back. Switch hands to look other way.   Then hover one hand and look over shoulder. If looking right, use left hand at sink. If looking left, use right hand at sink. 6.  Alternate Tapping Toes or Stepping to  lower shelf of cabinet  Move items under cabinet out of your way. Shift your hips/pelvis so weight on stance leg & Tighten muscles in hip.  SLOWLY step other leg so front of foot is in cabinet. Then step back to floor. Repeat with other leg.     ASSESSMENT:   CLINICAL IMPRESSION: He tolerated session well with increased stability and control for ramp and curb with rollator, requiring brief cueing for technique with only curb ascent. He appears safe to use rollator for community mobility with close supervision for ramp negotiation and STS with rollator away from wall. He walked 96' with rollator with 2 periods of extended standing, indicating improvements in activity tolerance. He continues to benefit from skilled PT.  OBJECTIVE IMPAIRMENTS .Abnormal gait, cardiopulmonary status limiting activity, decreased activity tolerance, decreased balance, decreased endurance, decreased knowledge of use of DME, decreased mobility, decreased ROM, decreased strength, dizziness, increased edema, impaired flexibility, postural dysfunction, prosthetic dependency , obesity, and pain.    ACTIVITY LIMITATIONS carrying, lifting, bending, standing, stairs, transfers, locomotion level, UE use with ADLs, sleep    PARTICIPATION LIMITATIONS: driving, community activity, and yard work, UE use with ADLs &  household activities   [place], Time since onset of injury/illness/exacerbation, and 3+ comorbidities: see PMH  are also affecting patient's functional outcome.    REHAB POTENTIAL: Good   CLINICAL DECISION MAKING: Evolving/moderate complexity   EVALUATION COMPLEXITY: Moderate     GOALS: Goals reviewed with patient? Yes  SHORT TERM GOALS: Target date: 09/01/2022  1. Patient able to stand with no UE support for 30 seconds with supervision. Baseline: SEE OBJECTIVE DATA Goal status: updated 08/03/22   2. Patient able to stand & sit from 18" without armrests using his UEs to RW safely Baseline: SEE  OBJECTIVE DATA Goal status: NEW at recert  3. Patient ambulates up to 200' with RW, prosthesis & AFO with supervision.  Goal status: updated at recert 5/64/3329  4. Patient is able to negotiate a ramp & curb with RW with wife's supervision to enable basic community access without w/c.  Goal status: updated at recert 04/22/8415     UPDATED LONG TERM GOALS: Target date: 10/26/2022   Patient demonstrates & verbalized understanding of prosthetic care to enable safe utilization of prosthesis. Baseline: SEE OBJECTIVE DATA Goal status: ongoing 08/03/22    Patient tolerates right prosthesis & left AFO / partial foot prosthesis wear >90% of awake hours without skin or limb pain issues. Baseline: SEE OBJECTIVE DATA Goal status: ongoing 08/03/22    Berg Balance >/= 20/56 and tasks of Berg with RW support >/= 52/56 to indicate lower fall risk with standing ADLs Baseline: SEE OBJECTIVE DATA Goal status: 08/03/2022 updated  Patient ambulates >300' with prosthesis & LRAD modified independent Baseline: SEE OBJECTIVE DATA Goal status: ongoing 08/03/2022   Patient negotiates ramps, curbs & stairs with single rail with prosthesis & LRAD modified independent. Baseline: SEE OBJECTIVE DATA Goal status: ongoing 08/03/22  PLAN: PT FREQUENCY: 2x/week   PT DURATION: 12 weeks  PLANNED INTERVENTIONS: Therapeutic exercises, Therapeutic activity, Neuromuscular re-education, Balance training, Gait training, Patient/Family education, Self Care, Stair training, Vestibular training, Canalith repositioning, Prosthetic training, DME instructions, Re-evaluation, and physical performance testing    PLAN FOR NEXT SESSION: continue to monitor HR & SpO2 with activities. Rollator obstacle negotiation, ramp/curb with wife supervision. Standing balance activity   Jana Hakim, Student-PT 08/16/2022, 12:48 PM  This entire session of physical therapy was performed under the direct supervision of PT signing evaluation  /treatment. PT reviewed note and agrees.  Jamey Reas, PT, DPT 08/16/2022, 12:52 PM

## 2022-08-16 ENCOUNTER — Ambulatory Visit (INDEPENDENT_AMBULATORY_CARE_PROVIDER_SITE_OTHER): Payer: Medicare Other | Admitting: Physical Therapy

## 2022-08-16 DIAGNOSIS — R2681 Unsteadiness on feet: Secondary | ICD-10-CM

## 2022-08-16 DIAGNOSIS — M6281 Muscle weakness (generalized): Secondary | ICD-10-CM | POA: Diagnosis not present

## 2022-08-16 DIAGNOSIS — R2689 Other abnormalities of gait and mobility: Secondary | ICD-10-CM | POA: Diagnosis not present

## 2022-08-16 DIAGNOSIS — R293 Abnormal posture: Secondary | ICD-10-CM

## 2022-08-16 DIAGNOSIS — Z9181 History of falling: Secondary | ICD-10-CM

## 2022-08-17 ENCOUNTER — Ambulatory Visit (INDEPENDENT_AMBULATORY_CARE_PROVIDER_SITE_OTHER): Payer: Medicare Other | Admitting: Physical Therapy

## 2022-08-17 ENCOUNTER — Encounter: Payer: Self-pay | Admitting: Physical Therapy

## 2022-08-17 DIAGNOSIS — R2681 Unsteadiness on feet: Secondary | ICD-10-CM | POA: Diagnosis not present

## 2022-08-17 DIAGNOSIS — M6281 Muscle weakness (generalized): Secondary | ICD-10-CM

## 2022-08-17 DIAGNOSIS — R293 Abnormal posture: Secondary | ICD-10-CM

## 2022-08-17 DIAGNOSIS — R2689 Other abnormalities of gait and mobility: Secondary | ICD-10-CM | POA: Diagnosis not present

## 2022-08-17 DIAGNOSIS — Z9181 History of falling: Secondary | ICD-10-CM

## 2022-08-17 NOTE — Therapy (Signed)
OUTPATIENT PHYSICAL THERAPY TREATMENT NOTE   Patient Name: Shane [name] MRN: [id number] DOB:January 22, 1941, 81 y.o., male Today's Date: 08/17/2022  PCP: Burnard Bunting, MD REFERRING PROVIDER: [name], MD for TTA    END OF SESSION:   PT End of Session - 08/17/22 1017     Visit Number 27    Number of Visits 34    Date for PT Re-Evaluation 10/26/22    Authorization Type UHC Medicare    Authorization Time Period UHC MEDICARE  $20 COPAY    Progress Note Due on Visit 80    PT Start Time 1015    PT Stop Time 1057    PT Time Calculation (min) 42 min    Equipment Utilized During Treatment Gait belt    Activity Tolerance Patient tolerated treatment well;Patient limited by fatigue    Behavior During Therapy WFL for tasks assessed/performed             Past Medical History:  Diagnosis Date   Anxiety    AV block, Mobitz 1    Cataract    Chronic kidney disease    d/t DM   CKD (chronic kidney disease), stage III ([place])    Depression    Diabetes mellitus    Vgo disposal insulin bolus  simular to insulin pump   Dyspnea    GERD (gastroesophageal reflux disease)    History of kidney stones    passed   Hyperlipidemia    Hypertension    Idiopathic pulmonary fibrosis ([place]) 11/2016   ILD (interstitial lung disease) ([place])    Moderate aortic stenosis    a. 10/2019 Echo: EF 55-60%, Gr2 DD. Nl RV.    Neuromuscular disorder ([place])    Neuropathy associated with endocrine disorder ([place])    Nonobstructive CAD (coronary artery disease)    a. 2012 Cath: mod, nonobs dzs; b. 10/2016 MV: EF 60%, no ischemia.   OSA on CPAP 05/05/2017   Unattended Home Sleep Test 7/2/813-AHI 38.6/hour, desaturation to 64%, body weight 261 pounds   PONV (postoperative nausea and vomiting)    Sleep apnea     uses cpap asked to bring mask and tubing   Past Surgical History:  Procedure Laterality Date   ABDOMINAL AORTOGRAM W/LOWER EXTREMITY N/A 12/10/2020   Procedure: ABDOMINAL AORTOGRAM W/LOWER  EXTREMITY;  Surgeon: Cherre Robins, MD;  Location: [facility];  Service: Cardiovascular;  Laterality: N/A;   AMPUTATION Right 01/22/2021   Procedure: 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;  Surgeon: [name], MD;  Location: [facility];  Service: Orthopedics;  Laterality: Right;   AMPUTATION Right 03/17/2021   Procedure: RIGHT BELOW KNEE AMPUTATION;  Surgeon: [name], MD;  Location: [place];  Service: Orthopedics;  Laterality: Right;   ANKLE FUSION Right 01/22/2021   Procedure: RIGHT FOOT TIBIOCALCANEAL FUSION;  Surgeon: [name], MD;  Location: [facility];  Service: Orthopedics;  Laterality: Right;   ANTERIOR FUSION CERVICAL SPINE  2012   CARDIAC CATHETERIZATION  2011   CARDIAC CATHETERIZATION N/A 11/09/2016   Procedure: Right Heart Cath;  Surgeon: Belva Crome, MD;  Location: [facility];  Service: Cardiovascular;  Laterality: N/A;   carpel tunnel     left wrist   CATARACT EXTRACTION     CATARACT EXTRACTION W/ INTRAOCULAR LENS  IMPLANT, BILATERAL  2013   CERVICAL LAMINECTOMY  2012   COLONOSCOPY N/A 01/14/2013   Procedure: COLONOSCOPY;  Surgeon: Irene Shipper, MD;  Location: [facility];  Service: Endoscopy;  Laterality:  N/A;   CORONARY ARTERY BYPASS GRAFT  11/04/2020   LIMA-LAD, SVG-OM1, SVG-PDA (Dr Marney [facility]) dc 11/18/2020   EYE SURGERY     I & D EXTREMITY Right 02/19/2021   Procedure: RIGHT ANKLE DEBRIDEMENT AND PLACEMENT ANTIBIOTIC BEADS;  Surgeon: [name], MD;  Location: [place];  Service: Orthopedics;  Laterality: Right;   KNEE SURGERY  1998   left   LEFT HEART CATH AND CORONARY ANGIOGRAPHY N/A 07/10/2020   Procedure: LEFT HEART CATH AND CORONARY ANGIOGRAPHY;  Surgeon: Sherren Mocha, MD;  Location: [facility];  Service: Cardiovascular;  Laterality: N/A;   LUMBAR LAMINECTOMY  2003   LUNG BIOPSY Left 12/26/2016   Procedure: LUNG BIOPSY;  Surgeon: [name], MD;  Location: [place];  Service: Thoracic;  Laterality: Left;   PACEMAKER IMPLANT N/A  03/30/2020   Procedure: PACEMAKER IMPLANT;  Surgeon: Evans Lance, MD;  Location: [facility];  Service: Cardiovascular;  Laterality: N/A;   PERIPHERAL VASCULAR INTERVENTION Right 12/10/2020   Procedure: PERIPHERAL VASCULAR INTERVENTION;  Surgeon: Cherre Robins, MD;  Location: [facility];  Service: Cardiovascular;  Laterality: Right;  SFA   POSTERIOR FUSION CERVICAL SPINE  2012   PPM GENERATOR REMOVAL N/A 12/14/2020   Procedure: PPM GENERATOR REMOVAL;  Surgeon: Evans Lance, MD;  Location: [facility];  Service: Cardiovascular;  Laterality: N/A;   TEE WITHOUT CARDIOVERSION N/A 12/11/2020   Procedure: TRANSESOPHAGEAL ECHOCARDIOGRAM (TEE);  Surgeon: Geralynn Rile, MD;  Location: [place];  Service: Cardiovascular;  Laterality: N/A;   TRIGGER FINGER RELEASE  2011   4th finger left hand   VIDEO ASSISTED THORACOSCOPY Left 12/26/2016   Procedure: VIDEO ASSISTED THORACOSCOPY;  Surgeon: [name], MD;  Location: [place];  Service: Thoracic;  Laterality: Left;   VIDEO BRONCHOSCOPY N/A 12/26/2016   Procedure: VIDEO BRONCHOSCOPY;  Surgeon: [name], MD;  Location: [place];  Service: Thoracic;  Laterality: N/A;   Patient Active Problem List   Diagnosis Date Noted   Intermediate stage nonexudative age-related macular degeneration of both eyes 04/25/2022   Research study patient 12/24/2021   Post-operative pain    CKD (chronic kidney disease), stage II    Labile blood glucose    Pulmonary fibrosis ([place])    Hyponatremia    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[place]) 03/26/2021   S/P BKA (below knee amputation) unilateral, right ([place])    Shortness of breath    Acute blood loss anemia    Stage 3 chronic kidney disease ([place])    Atrial fibrillation ([place])    Controlled type 2 diabetes mellitus with hyperglycemia, with long-term current use of insulin ([facility])    Postoperative pain    Postoperative hemorrhagic shock 03/20/2021   Atherosclerosis of native arteries of  extremities with gangrene, right leg ([place])    Hardware complicating wound infection ([place])    Abscess of bursa of right ankle 02/15/2021   Endocarditis 01/19/2021   Peripheral vascular disease ([place]) 12/29/2020   CHF (congestive heart failure), NYHA class II, chronic, diastolic ([place]) 93/81/8299   History of COVID-19 12/22/2020   SSS (sick sinus syndrome) ([place]) 12/22/2020   Acute bacterial endocarditis    MSSA bacteremia 12/09/2020   Diabetic foot ulcer ([place]) 12/09/2020   Foot drop, right foot    Generalized weakness 12/08/2020   Dehydration with hyponatremia 12/08/2020   Atrial fibrillation, chronic ([place]) 12/08/2020   Elevated troponin level not due myocardial infarction 12/08/2020   Adrenal insufficiency ([place]) 11/14/2020  Orthostatic hypotension 11/14/2020   Physical deconditioning 11/14/2020   Difficulty sleeping 11/07/2020   Postoperative anemia due to acute blood loss 11/07/2020   Right ventricular dysfunction 11/05/2020   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/04/2020   Hearing loss 11/03/2020   Cardiac device in situ 09/09/2020   Coronary atherosclerosis due to lipid rich plaque 09/02/2020   COVID-19 virus infection 07/18/2020   Coronary artery disease involving native heart without angina pectoris 07/10/2020   Chronic kidney disease, stage 3a ([place]) 03/29/2020   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/28/2020   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 ([place]) 03/16/2020   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 ([place]) 03/16/2020   Right epiretinal membrane 03/16/2020   Posterior vitreous detachment of right eye 03/16/2020   Aortic stenosis, moderate 03/12/2020   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   Near syncope 04/27/2018   Hypoglycemia due to insulin 01/06/2018   Essential hypertension 01/06/2018   Diabetes mellitus type 2, with complication, on long term insulin pump ([place]) 01/06/2018    Syncope and collapse 01/05/2018   Encounter for therapeutic drug monitoring 06/29/2017   OSA on CPAP 05/05/2017   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[place]) 01/05/2017   Abnormal chest x-ray 10/12/2016   Benign neoplasm of colon 01/14/2013    REFERRING DIAG: Z89.511 (ICD-10-CM) - Hx of right BKA   ONSET DATE: 05/04/2022 MD visit with release to [facility] on LLE  THERAPY DIAG:  Unsteadiness on feet  Other abnormalities of gait and mobility  Muscle weakness (generalized)  Abnormal posture  History of falling  Rationale for Evaluation and Treatment Rehabilitation  PERTINENT HISTORY: CKD st III, AV block Mobitz, pacemaker, pulmonary fibrosis, CAD, CABG 21, HTN, A-Fib, DM2, PAD, right BKA, Left TMA  PRECAUTIONS: ICD/Pacemaker - continue monitoring vitals with activity and calculated HRmax is 151 bpm  SUBJECTIVE:  They looked at the info for the rollator they bought and it is a bariatric rollator so they will return it and look for another that suits the height needed. His blood sugar was high today following a different breakfast than usual, and he is feeling increased fatigue and slowness. They hadn't yet contacted the prosthetist for an adjustment to the fit of the calf support of the AFO.  PAIN:    Are you having pain? Yes: NPRS scale: today 0/10, over last week lowest 0/10  Pain location: left shoulder lateral just distal to joint Pain description: tingling when shoulder in improper posture. Aggravating factors: improper posture. Relieving factors: correcting posture   OBJECTIVE: (objective measures completed at initial evaluation unless otherwise dated)  COGNITION: Overall cognitive status: Within functional limits for tasks assessed   POSTURE: 05/09/2022:  rounded shoulders, forward head, and flexed trunk    LOWER EXTREMITY ROM:    UPPER EXTREMITY ROM:  ROM P:passive  A:active Left 06/23/22 Supine all limited by pain Left 08/01/22 Seated All painfree  Right 08/01/22 Seated  Shoulder flexion 30* A: 148 A: 153  Shoulder extension 0* neutral A: 72 A: 74  Shoulder abduction 45* A: 135 A: 141  Shoulder adduction     Shoulder internal rotation 20* A: 84 A: 89  Shoulder external rotation 10* A: 88 A: 83  Elbow flexion     Elbow extension -15*     (Blank rows = not tested)    ROM P:passive  A:active Right Eval 05/09/2022 Left Eval 05/09/2022 Bilateral 08/03/22  Hip flexion  Hip extension A: -25* standing A: -25* standing A: -15*  RW support standing  Hip abduction       Hip adduction       Hip internal rotation       Hip external rotation       Knee flexion       Knee extension       Ankle dorsiflexion NA P: -17*   Ankle plantarflexion NA     Ankle inversion NA     Ankle eversion NA      (Blank rows = not tested)   LOWER EXTREMITY MMT:   MMT Right Eval 05/09/2022 Left Eval 05/09/2022  Hip flexion      Hip extension Functional testing 3-/5 Functional testing 3-/5  Hip abduction Functional testing 3-/5 Functional testing 3-/5  Hip adduction      Hip internal rotation      Hip external rotation      Knee flexion Functional testing 3-/5 Functional testing 3-/5  Knee extension Functional testing 3/5 Functional testing 3/5  Ankle dorsiflexion      Ankle plantarflexion      Ankle inversion      Ankle eversion      (Blank rows = not tested)   POSTURE:  06/23/2022  seated - head forward, rounded shoulders and posterior pelvic tilt  TRANSFERS: 05/09/2022:  Sit to stand: SBA and requires use of armrests from 20" stable w/c & needs RW for stabilization 05/09/2022:  Stand to sit: SBA and requires use of armrests from 20" stable w/c & needs RW for stabilization   GAIT: 08/11/2022: Pt amb 100' with rollator, right TTA prosthesis & left AFO partial shoe filler with SBA. He demonstrates increased step length bil., intermittent impaired foot clearance on RLE  08/03/2022: 2-minute Walk Test with RW, right TTA prosthesis & left AFO  partial shoe filler. Resting pre HR 70 SpO2 100% HR after activity HR 52  SpO2 100% light amount of light headness Distance 158' with supervision.   Gait pattern: step-to pattern, but distance improved since evaluation, Right hip hike, trunk flexed, and normal 4" BOS  Pt neg ramp & curb with RW, ight TTA prosthesis & left AFO partial shoe filler with supervision. He required seated rest half way due to fatigue.    05/09/2022:  Gait pattern: step to pattern, decreased step length- Left, decreased stance time- Right, Right hip hike, antalgic, lateral hip instability, trunk flexed, and wide BOS Distance walked: 25' Assistive device utilized: Environmental consultant - 2 wheeled and TTA prosthesis RLE Level of assistance: Min A Comments: pt reports dizziness with turning in gait   UE FUNCTION: 06/23/2022:  Pt reports that he uses RUE for fine motor like writing but uses both hands for gross motor or sports.  Seated - pt unable to raise LUE in flexion or abd due to pain.  Supine - pt unable to move LUE in flexion or abd due to pain.  PROM flexion > pain than abd   Pt tolerates elbow ext with palm supinated but with palm neutral causes shoulder pain. Also painful with elbow already extended and pronate palm.   PT simulated pushing on RW thru LUE with manual resistance.  Pt reports slight increase in pain but not as sharp as above function.    FUNCTIONAL TESTs:  Merrilee Jansky Balance Scale: 08/03/2022:  13/56 & tasks of Berg with RW support 45/56     Berg Balance Scale: 05/09/2022:  9/56 & tasks of Berg with RW support 27/56  [facility] PT Assessment - 05/09/22 0930                Berg Balance Test    Sit to Stand Needs moderate or maximal assist to stand   task with RW support = 3    Standing Unsupported Unable to stand 30 seconds unassisted   task with RW support = 3    Sitting with Back Unsupported but Feet Supported on Floor or Stool Able to sit safely and securely 2 minutes     Stand to Sit Uses backs of legs against  chair to control descent   task with RW support = 3    Transfers Able to transfer safely, definite need of hands     Standing Unsupported with Eyes Closed Needs help to keep from falling   task with RW support = 3    Standing Unsupported with Feet Together Needs help to attain position and unable to hold for 15 seconds   task with RW support = 2    From Standing, Reach Forward with Outstretched Arm Loses balance while trying/requires external support   task with RW support = 1    From Standing Position, Pick up Object from Floor Unable to try/needs assist to keep balance   task with RW support = 2    From Standing Position, Turn to Look Behind Over each Shoulder Needs assist to keep from losing balance and falling   task with RW support = 1    Turn 360 Degrees Needs assistance while turning   task with RW support = 0    Standing Unsupported, Alternately Place Feet on Step/Stool Needs assistance to keep from falling or unable to try   task with RW support = 0    Standing Unsupported, One Foot in [employer] balance while stepping or standing   task with RW support = 1    Standing on One Leg Unable to try or needs assist to prevent fall   task with RW support = 1    Total Score 9     Berg comment: tasks of Berg with RW support 27/56                     CURRENT PROSTHETIC WEAR ASSESSMENT: 08/03/2022: Pt wears both right TTA prosthesis & left AFO partial shoe filler>90% of awake hours.  He has 2 wounds on dorsum of left foot being monitored by PCP.  He is wearing left Vive Wear compression sock below knee on LLE which has helped wounds to heal. Pt is independent in donning & doffing both prosthesis & AFO. Skin check, cleaning and minor cues on adjusting ply socks.   05/09/2022:   Patient is dependent with: skin check, residual limb care, care of non-amputated limb, prosthetic cleaning, ply sock cleaning, and correct ply sock adjustment Donning prosthesis: Modified independence Doffing  prosthesis: Modified independence Prosthetic wear tolerance: >90% of awake hours/day, 7 days/week Prosthetic weight bearing tolerance: 5 minutes Edema: LLE pitting edema with >10 sec capillary refill Residual limb condition: Right BKA limb has no openings, normal temperature & moisture.  Redness patella & patella tendon from friction of liner with knee flexed. Cylinderical shape.  LLE TMA pt's wife showed picture of wound on dorsum of foot that is being managed by PCP.   Prosthetic description: silicon liner with pin lock suspension, total contact socket, dynamic response foot.      TODAY'S TREATMENT:  08/17/2022 Neuro Re-ed: Standing in corner with walker -  back of hands touching static stance x 30s -back of hands touching vertical, horizontal, diagonal head turns x 5 ea., increased sway -back of hands touching with eyes closed 2 x 30s with increased sway noted -static stance without UE support about 30s before his back touched the wall  Prosthetic Training with right TTA prosthesis & left dynamic AFO and custom shoe with toe filler and carbon plate: Pt amb 40' x 2 around obstacles with rollator and SBA, frequent instances of Rt toe drag Pt ascend and descend ramp and curb with rollator and wife CGA to SBA, required cueing to lock brakes for ramp but appears safe to use rollator in community with her close supervision and guarding at ramps and both verbalized comfort Pt sit to/from stand with rollator against wall with wife SBA and minA to steady rollator during sit, wife verbalized understanding with instructions for guarding during STS  08/15/2022 Vitals:  -following 40' walk with ascend curb, descend ramp, and STS away from wall onto rollator seat: pt reported SOB and SpO2 94% HR 75 bpm -following ascend ramp, descend curb & 40' walk: HR 86 bpm  Self Care: PT discussed injury risk & higher fall potential without AFO and prosthesis and encouraged constant wear even upon discharge from PT,  pt verbalized understanding  Prosthetic Training with right TTA prosthesis & left dynamic AFO and custom shoe with toe filler and carbon plate: Pt amb 40', 40', 170' with rollator and SBA, frequent instances of Rt toe drag Pt ascend and descend ramp with rollator and CGA, no instances of knee buckling or losses of balance Pt ascend and descend curb with rollator and SBA and cueing for rollator management upon ascent Pt turn to sit with rollator away from wall with minA to control rollator, stand and move rollator to front of body with CGA  PT explained rollator options and asked pt to bring pictures and item description to next appointment to determine if appropriate for use at home, pt and wife verbalized understanding  08/11/2022 Vitals:  -following 100' walk and STS against wall: pt reported SOB and SpO2 93% -following ramp and STS not against wall: pt reported SOB and SpO2 91% HR 77 bpm  -after 1 min: SpO2 93% HR 62 bpm  -after 3 min: SpO2 95% HR 60 bpm  Prosthetic Training with right TTA prosthesis & left dynamic AFO and custom shoe with toe filler and carbon plate: PT visual and verbal explanation of safe use of rollator for mobility, STS, ramp, and curb with additional recommendations for prosthesis management with STS and ramp/curb - pt returned demo Pt amb 100', 20', 30' with rollator and SBA, verbal cueing for reduced step length and speed, using brakes Pt sit to/from stand with rollator against wall with SBA, sit to/from stand with rollator away from wall with minA to control rollator Pt sit to/from stand in chair without armrests x1 SBA and x1 minA for ascent, SBA descent Pt ascend and descend curb with rollator and SBA Pt ascend ramp SBA and descend ramp with CGA with one instance of Rt knee buckling, Rt brake came unlocked during ramp negotiation   HOME EXERCISE PROGRAM  Access Code: [email or web address] URL: [email or web address] Date: 07/05/2022 Prepared by: Jamey Reas  Exercises - Seated Correct Posture  - 1 x daily - 7 x weekly - Seated Scapular Retraction  - 2-5 x daily - 7 x weekly - 1 sets - 10 reps - 5 seconds hold - Supine Chin Tucks on [employer]  -  2-5 x daily - 7 x weekly - 1 sets - 10 reps - 5 seconds hold - Seated Isometric Cervical Retraction with Chin Tucks with Pillow behind head  - 2-5 x daily - 7 x weekly - 1 sets - 10 reps - 5 seconds hold - Circular Shoulder Pendulum with Table Support  - 1-2 x daily - 7 x weekly - 1-2 sets - 10 reps - 5 seconds hold - Flexion-Extension Shoulder Pendulum with Table Support  - 1-2 x daily - 7 x weekly - 1-2 sets - 10 reps - Horizontal Shoulder Pendulum with Table Support  - 1-2 x daily - 7 x weekly - 1-2 sets - 10 reps - Supine Shoulder Flexion Extension AAROM with Dowel  - 1 x daily - 7 x weekly - 1-2 sets - 10 reps - 5 seconds hold - Supine Shoulder Abduction AAROM with Dowel  - 1 x daily - 7 x weekly - 1-2 sets - 10 reps - 5 seconds hold - Supine Shoulder External Rotation with Dowel  - 1 x daily - 7 x weekly - 1-2 sets - 10 reps - 5 seconds hold   Neuromuscular Reed-  cues on proprioception RLE using residual limb, balance & standing tolerance.   Do each exercise 1-2  times per day Do each exercise 5-10 repetitions Hold each exercise for 2 seconds to feel your location  AT [place].  Try to find this position when standing still for activities.   USE TAPE ON FLOOR TO MARK THE MIDLINE POSITION which is even with middle of sink.  You also should try to feel with your limb pressure in socket.  You are trying to feel with limb what you used to feel with the bottom of your foot.  Side to Side Shift: Moving your hips only (not shoulders): move weight onto your left leg, HOLD/FEEL pressure in socket.  Move back to equal weight on each leg, HOLD/FEEL pressure in socket. Move weight onto your right leg, HOLD/FEEL pressure in socket.  Move back to equal weight on each leg, HOLD/FEEL pressure in socket. Repeat.  Start with both hands on sink, progress to hand on prosthetic side only, then no hands.  Front to Back Shift: Moving your hips only (not shoulders): move your weight forward onto your toes, HOLD/FEEL pressure in socket. Move your weight back to equal Flat Foot on both legs, HOLD/FEEL  pressure in socket. Move your weight back onto your heels, HOLD/FEEL  pressure in socket. Move your weight back to equal on both legs, HOLD/FEEL  pressure in socket. Repeat.  Start with both hands on sink, progress to hand on prosthetic side only, then no hands.  Moving Cones / Cups: With equal weight on each leg: Hold on with one hand the first time, then progress to no hand supports. Move cups from one side of sink to the other. Place cups ~2" out of your reach, progress to 10" beyond reach.  Place one hand in middle of sink and reach with other hand. Do both arms.  Then hover one hand and move cups with other hand.  Overhead/Upward Reaching: alternated reaching up to top cabinets or ceiling if no cabinets present. Keep equal weight on each leg. Start with one hand support on counter while other hand reaches and progress to no hand support with reaching.  ace one hand in middle of sink and reach with other hand. Do both  arms.  Then hover one hand and move cups with other hand.  5.   Looking Over Shoulders: With equal weight on each leg: alternate turning to look over your shoulders with one hand support on counter as needed.  Start with head motions only to look in front of shoulder, then even with shoulder and progress to looking behind you. To look to side, move head /eyes, then shoulder on side looking pulls back, shift more weight to side looking and pull hip back. Place one hand in middle of sink and let go with other hand so your shoulder can pull back. Switch hands to look other way.   Then hover one hand and look over shoulder. If looking right,  use left hand at sink. If looking left, use right hand at sink. 6.  Alternate Tapping Toes or Stepping to lower shelf of cabinet  Move items under cabinet out of your way. Shift your hips/pelvis so weight on stance leg & Tighten muscles in hip.  SLOWLY step other leg so front of foot is in cabinet. Then step back to floor. Repeat with other leg.     ASSESSMENT:   CLINICAL IMPRESSION: He is continuing to improve in static balance, showing upright posture with static stance. He ambulated around obstacles in clinic and ascended/descended ramp and curb with his wife's supervision and guarding, and appears safe to use the rollator in the community with her supervision and assistance. He continues to benefit from skilled PT to address functional limitations and prosthetic care.  OBJECTIVE IMPAIRMENTS .Abnormal gait, cardiopulmonary status limiting activity, decreased activity tolerance, decreased balance, decreased endurance, decreased knowledge of use of DME, decreased mobility, decreased ROM, decreased strength, dizziness, increased edema, impaired flexibility, postural dysfunction, prosthetic dependency , obesity, and pain.    ACTIVITY LIMITATIONS carrying, lifting, bending, standing, stairs, transfers, locomotion level, UE use with ADLs, sleep    PARTICIPATION LIMITATIONS: driving, community activity, and yard work, UE use with ADLs &  household activities   [place], Time since onset of injury/illness/exacerbation, and 3+ comorbidities: see PMH  are also affecting patient's functional outcome.    REHAB POTENTIAL: Good   CLINICAL DECISION MAKING: Evolving/moderate complexity   EVALUATION COMPLEXITY: Moderate     GOALS: Goals reviewed with patient? Yes  SHORT TERM GOALS: Target date: 09/01/2022  1. Patient able to stand with no UE support for 30 seconds with supervision. Baseline: SEE OBJECTIVE DATA Goal status: updated 08/03/22   2. Patient able to stand & sit from 18"  without armrests using his UEs to RW safely Baseline: SEE OBJECTIVE DATA Goal status: NEW at recert  3. Patient ambulates up to 200' with RW, prosthesis & AFO with supervision.  Goal status: updated at recert [id number]/8756  4. Patient is able to negotiate a ramp & curb with RW with wife's supervision to enable basic community access without w/c.  Goal status: met 08/17/22 with rollator     UPDATED LONG TERM GOALS: Target date: 10/26/2022   Patient demonstrates & verbalized understanding of prosthetic care to enable safe utilization of prosthesis. Baseline: SEE OBJECTIVE DATA Goal status: ongoing 08/03/22    Patient tolerates right prosthesis & left AFO / partial foot prosthesis wear >90% of awake hours without skin or limb pain issues. Baseline: SEE OBJECTIVE DATA Goal status: ongoing 08/03/22    Berg Balance >/= 20/56 and tasks of Berg with RW support >/= 52/56 to indicate lower fall risk with standing ADLs Baseline: SEE OBJECTIVE DATA Goal status: 08/03/2022 updated  Patient ambulates >300' with prosthesis & LRAD modified independent Baseline: SEE OBJECTIVE DATA Goal status: ongoing 08/03/2022   Patient negotiates ramps, curbs & stairs with single rail with prosthesis & LRAD modified independent. Baseline: SEE OBJECTIVE DATA Goal status: ongoing 08/03/22  PLAN: PT FREQUENCY: 2x/week   PT DURATION: 12 weeks  PLANNED INTERVENTIONS: Therapeutic exercises, Therapeutic activity, Neuromuscular re-education, Balance training, Gait training, Patient/Family education, Self Care, Stair training, Vestibular training, Canalith repositioning, Prosthetic training, DME instructions, Re-evaluation, and physical performance testing    PLAN FOR NEXT SESSION: continue to monitor HR & SpO2 with activities. Progress LE strengthening for STS and standing balance   Jana Hakim, Student-PT 08/17/2022, 12:35 PM  This entire session of physical therapy was performed under the direct supervision of PT  signing evaluation /treatment. PT reviewed note and agrees.  Jamey Reas, PT, DPT 08/17/2022, 12:50 PM

## 2022-08-22 ENCOUNTER — Ambulatory Visit (INDEPENDENT_AMBULATORY_CARE_PROVIDER_SITE_OTHER): Payer: Medicare Other | Admitting: Physical Therapy

## 2022-08-22 ENCOUNTER — Encounter: Payer: Self-pay | Admitting: Physical Therapy

## 2022-08-22 DIAGNOSIS — M6281 Muscle weakness (generalized): Secondary | ICD-10-CM | POA: Diagnosis not present

## 2022-08-22 DIAGNOSIS — R2689 Other abnormalities of gait and mobility: Secondary | ICD-10-CM | POA: Diagnosis not present

## 2022-08-22 DIAGNOSIS — R293 Abnormal posture: Secondary | ICD-10-CM

## 2022-08-22 DIAGNOSIS — R2681 Unsteadiness on feet: Secondary | ICD-10-CM

## 2022-08-22 DIAGNOSIS — Z9181 History of falling: Secondary | ICD-10-CM

## 2022-08-22 NOTE — Therapy (Signed)
OUTPATIENT PHYSICAL THERAPY TREATMENT NOTE   Patient Name: Shane [name] MRN: [id number] DOB:Sep 25, 1941, 81 y.o., male Today's Date: 08/23/2022  PCP: Burnard Bunting, MD REFERRING PROVIDER: [name], MD    END OF SESSION:   PT End of Session - 08/23/22 0802     Visit Number 29    Number of Visits 93    Date for PT Re-Evaluation 10/26/22    Authorization Type UHC Medicare    Authorization Time Period UHC MEDICARE  $20 COPAY    Progress Note Due on Visit 61    PT Start Time 0800    PT Stop Time 0845    PT Time Calculation (min) 45 min    Equipment Utilized During Treatment Gait belt    Activity Tolerance Patient tolerated treatment well;Patient limited by fatigue    Behavior During Therapy WFL for tasks assessed/performed             Past Medical History:  Diagnosis Date   Anxiety    AV block, Mobitz 1    Cataract    Chronic kidney disease    d/t DM   CKD (chronic kidney disease), stage III ([place])    Depression    Diabetes mellitus    Vgo disposal insulin bolus  simular to insulin pump   Dyspnea    GERD (gastroesophageal reflux disease)    History of kidney stones    passed   Hyperlipidemia    Hypertension    Idiopathic pulmonary fibrosis ([place]) 11/2016   ILD (interstitial lung disease) ([place])    Moderate aortic stenosis    a. 10/2019 Echo: EF 55-60%, Gr2 DD. Nl RV.    Neuromuscular disorder ([place])    Neuropathy associated with endocrine disorder ([place])    Nonobstructive CAD (coronary artery disease)    a. 2012 Cath: mod, nonobs dzs; b. 10/2016 MV: EF 60%, no ischemia.   OSA on CPAP 05/05/2017   Unattended Home Sleep Test 7/2/813-AHI 38.6/hour, desaturation to 64%, body weight 261 pounds   PONV (postoperative nausea and vomiting)    Sleep apnea     uses cpap asked to bring mask and tubing   Past Surgical History:  Procedure Laterality Date   ABDOMINAL AORTOGRAM W/LOWER EXTREMITY N/A 12/10/2020   Procedure: ABDOMINAL AORTOGRAM W/LOWER EXTREMITY;   Surgeon: Cherre Robins, MD;  Location: [place] [facility];  Service: Cardiovascular;  Laterality: N/A;   AMPUTATION Right 01/22/2021   Procedure: 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;  Surgeon: [name], MD;  Location: [place];  Service: Orthopedics;  Laterality: Right;   AMPUTATION Right 03/17/2021   Procedure: RIGHT BELOW KNEE AMPUTATION;  Surgeon: [name], MD;  Location: [place];  Service: Orthopedics;  Laterality: Right;   ANKLE FUSION Right 01/22/2021   Procedure: RIGHT FOOT TIBIOCALCANEAL FUSION;  Surgeon: [name], MD;  Location: [place];  Service: Orthopedics;  Laterality: Right;   ANTERIOR FUSION CERVICAL SPINE  2012   CARDIAC CATHETERIZATION  2011   CARDIAC CATHETERIZATION N/A 11/09/2016   Procedure: Right Heart Cath;  Surgeon: Belva Crome, MD;  Location: [place] [facility];  Service: Cardiovascular;  Laterality: N/A;   carpel tunnel     left wrist   CATARACT EXTRACTION     CATARACT EXTRACTION W/ INTRAOCULAR LENS  IMPLANT, BILATERAL  2013   CERVICAL LAMINECTOMY  2012   COLONOSCOPY N/A 01/14/2013   Procedure: COLONOSCOPY;  Surgeon: Irene Shipper, MD;  Location: [facility];  Service: Endoscopy;  Laterality: N/A;  CORONARY ARTERY BYPASS GRAFT  11/04/2020   LIMA-LAD, SVG-OM1, SVG-PDA (Dr [facility]) dc 11/18/2020   EYE SURGERY     I & D EXTREMITY Right 02/19/2021   Procedure: RIGHT ANKLE DEBRIDEMENT AND PLACEMENT ANTIBIOTIC BEADS;  Surgeon: [name], MD;  Location: [place];  Service: Orthopedics;  Laterality: Right;   KNEE SURGERY  1998   left   LEFT HEART CATH AND CORONARY ANGIOGRAPHY N/A 07/10/2020   Procedure: LEFT HEART CATH AND CORONARY ANGIOGRAPHY;  Surgeon: Sherren Mocha, MD;  Location: [place] [facility];  Service: Cardiovascular;  Laterality: N/A;   LUMBAR LAMINECTOMY  2003   LUNG BIOPSY Left 12/26/2016   Procedure: LUNG BIOPSY;  Surgeon: [name], MD;  Location: [place];  Service: Thoracic;  Laterality: Left;   PACEMAKER IMPLANT N/A 03/30/2020    Procedure: PACEMAKER IMPLANT;  Surgeon: Evans Lance, MD;  Location: [place] [facility];  Service: Cardiovascular;  Laterality: N/A;   PERIPHERAL VASCULAR INTERVENTION Right 12/10/2020   Procedure: PERIPHERAL VASCULAR INTERVENTION;  Surgeon: Cherre Robins, MD;  Location: [place] [facility];  Service: Cardiovascular;  Laterality: Right;  SFA   POSTERIOR FUSION CERVICAL SPINE  2012   PPM GENERATOR REMOVAL N/A 12/14/2020   Procedure: PPM GENERATOR REMOVAL;  Surgeon: Evans Lance, MD;  Location: [place] [facility];  Service: Cardiovascular;  Laterality: N/A;   TEE WITHOUT CARDIOVERSION N/A 12/11/2020   Procedure: TRANSESOPHAGEAL ECHOCARDIOGRAM (TEE);  Surgeon: Geralynn Rile, MD;  Location: [place];  Service: Cardiovascular;  Laterality: N/A;   TRIGGER FINGER RELEASE  2011   4th finger left hand   VIDEO ASSISTED THORACOSCOPY Left 12/26/2016   Procedure: VIDEO ASSISTED THORACOSCOPY;  Surgeon: [name], MD;  Location: [place];  Service: Thoracic;  Laterality: Left;   VIDEO BRONCHOSCOPY N/A 12/26/2016   Procedure: VIDEO BRONCHOSCOPY;  Surgeon: [name], MD;  Location: [place];  Service: Thoracic;  Laterality: N/A;   Patient Active Problem List   Diagnosis Date Noted   Intermediate stage nonexudative age-related macular degeneration of both eyes 04/25/2022   Research study patient 12/24/2021   Post-operative pain    CKD (chronic kidney disease), stage II    Labile blood glucose    Pulmonary fibrosis ([place])    Hyponatremia    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[place]) 03/26/2021   S/P BKA (below knee amputation) unilateral, right ([place])    Shortness of breath    Acute blood loss anemia    Stage 3 chronic kidney disease ([place])    Atrial fibrillation ([place])    Controlled type 2 diabetes mellitus with hyperglycemia, with long-term current use of insulin (H[facility])    Postoperative pain    Postoperative hemorrhagic shock 03/20/2021   Atherosclerosis of native arteries of extremities  with gangrene, right leg ([place])    Hardware complicating wound infection ([place])    Abscess of bursa of right ankle 02/15/2021   Endocarditis 01/19/2021   Peripheral vascular disease ([place]) 12/29/2020   CHF (congestive heart failure), NYHA class II, chronic, diastolic ([place]) 58/85/0277   History of COVID-19 12/22/2020   SSS (sick sinus syndrome) ([place]) 12/22/2020   Acute bacterial endocarditis    MSSA bacteremia 12/09/2020   Diabetic foot ulcer ([place]) 12/09/2020   Foot drop, right foot    Generalized weakness 12/08/2020   Dehydration with hyponatremia 12/08/2020   Atrial fibrillation, chronic (H[facility]) 12/08/2020   Elevated troponin level not due myocardial infarction 12/08/2020   Adrenal insufficiency ([place]) 11/14/2020   Orthostatic  hypotension 11/14/2020   Physical deconditioning 11/14/2020   Difficulty sleeping 11/07/2020   Postoperative anemia due to acute blood loss 11/07/2020   Right ventricular dysfunction 11/05/2020   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/04/2020   Hearing loss 11/03/2020   Cardiac device in situ 09/09/2020   Coronary atherosclerosis due to lipid rich plaque 09/02/2020   COVID-19 virus infection 07/18/2020   Coronary artery disease involving native heart without angina pectoris 07/10/2020   Chronic kidney disease, stage 3a ([place]) 03/29/2020   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/28/2020   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 ([place]) 03/16/2020   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 ([place]) 03/16/2020   Right epiretinal membrane 03/16/2020   Posterior vitreous detachment of right eye 03/16/2020   Aortic stenosis, moderate 03/12/2020   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   Near syncope 04/27/2018   Hypoglycemia due to insulin 01/06/2018   Essential hypertension 01/06/2018   Diabetes mellitus type 2, with complication, on long term insulin pump ([place]) 01/06/2018   Syncope and  collapse 01/05/2018   Encounter for therapeutic drug monitoring 06/29/2017   OSA on CPAP 05/05/2017   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[place]) 01/05/2017   Abnormal chest x-ray 10/12/2016   Benign neoplasm of colon 01/14/2013    REFERRING DIAG: Z89.511 (ICD-10-CM) - Hx of right BKA   ONSET DATE: 05/04/2022 MD visit with release to WB on LLE  THERAPY DIAG:  Unsteadiness on feet  Other abnormalities of gait and mobility  Muscle weakness (generalized)  Abnormal posture  History of falling  Rationale for Evaluation and Treatment Rehabilitation  PERTINENT HISTORY: CKD st III, AV block Mobitz, pacemaker, pulmonary fibrosis, CAD, CABG 21, HTN, A-Fib, DM2, PAD, right BKA, Left TMA  PRECAUTIONS: ICD/Pacemaker - continue monitoring vitals with activity and calculated HRmax is 151 bpm  SUBJECTIVE:  Rollator is still coming, but they have a small wheeled rollator for use in the meantime. No significant changes since yesterday. He recovered fatigue quickly after yesterday's session and didn't feel too tired. He sees Gerald Stabs at noon today for the AFO fit with no concerns for the prosthesis, and he sees Colletta Maryland NP for wound tomorrow. When walking at home he feels that he can go a good distance longer, walking between rooms in the home.  PAIN:    Are you having pain? Yes: NPRS scale: today 0/10, over last week lowest 0/10  Pain location: left shoulder lateral just distal to joint Pain description: tingling when shoulder in improper posture. Aggravating factors: improper posture. Relieving factors: correcting posture   OBJECTIVE: (objective measures completed at initial evaluation unless otherwise dated)  COGNITION: Overall cognitive status: Within functional limits for tasks assessed   POSTURE: 05/09/2022:  rounded shoulders, forward head, and flexed trunk    LOWER EXTREMITY ROM:    UPPER EXTREMITY ROM:  ROM P:passive  A:active Left 06/23/22 Supine all limited by pain  Left 08/01/22 Seated All painfree Right 08/01/22 Seated  Shoulder flexion 30* A: 148 A: 153  Shoulder extension 0* neutral A: 72 A: 74  Shoulder abduction 45* A: 135 A: 141  Shoulder adduction     Shoulder internal rotation 20* A: 84 A: 89  Shoulder external rotation 10* A: 88 A: 83  Elbow flexion     Elbow extension -15*     (Blank rows = not tested)    ROM P:passive  A:active Right Eval 05/09/2022 Left Eval 05/09/2022 Bilateral 08/03/22  Hip flexion       Hip extension A: -25* standing A: -25* standing A: -15*  RW support standing  Hip abduction       Hip adduction       Hip internal rotation       Hip external rotation       Knee flexion       Knee extension       Ankle dorsiflexion NA P: -17*   Ankle plantarflexion NA     Ankle inversion NA     Ankle eversion NA      (Blank rows = not tested)   LOWER EXTREMITY MMT:   MMT Right Eval 05/09/2022 Left Eval 05/09/2022  Hip flexion      Hip extension Functional testing 3-/5 Functional testing 3-/5  Hip abduction Functional testing 3-/5 Functional testing 3-/5  Hip adduction      Hip internal rotation      Hip external rotation      Knee flexion Functional testing 3-/5 Functional testing 3-/5  Knee extension Functional testing 3/5 Functional testing 3/5  Ankle dorsiflexion      Ankle plantarflexion      Ankle inversion      Ankle eversion      (Blank rows = not tested)   POSTURE:  06/23/2022  seated - head forward, rounded shoulders and posterior pelvic tilt  TRANSFERS: 05/09/2022:  Sit to stand: SBA and requires use of armrests from 20" stable w/c & needs RW for stabilization 05/09/2022:  Stand to sit: SBA and requires use of armrests from 20" stable w/c & needs RW for stabilization   GAIT: 08/11/2022: Pt amb 100' with rollator, right TTA prosthesis & left AFO partial shoe filler with SBA. He demonstrates increased step length bil., intermittent impaired foot clearance on RLE  08/03/2022: 2-minute Walk Test with RW,  right TTA prosthesis & left AFO partial shoe filler. Resting pre HR 70 SpO2 100% HR after activity HR 52  SpO2 100% light amount of light headness Distance 158' with supervision.   Gait pattern: step-to pattern, but distance improved since evaluation, Right hip hike, trunk flexed, and normal 4" BOS  Pt neg ramp & curb with RW, ight TTA prosthesis & left AFO partial shoe filler with supervision. He required seated rest half way due to fatigue.    05/09/2022:  Gait pattern: step to pattern, decreased step length- Left, decreased stance time- Right, Right hip hike, antalgic, lateral hip instability, trunk flexed, and wide BOS Distance walked: 25' Assistive device utilized: Environmental consultant - 2 wheeled and TTA prosthesis RLE Level of assistance: Min A Comments: pt reports dizziness with turning in gait   UE FUNCTION: 06/23/2022:  Pt reports that he uses RUE for fine motor like writing but uses both hands for gross motor or sports.  Seated - pt unable to raise LUE in flexion or abd due to pain.  Supine - pt unable to move LUE in flexion or abd due to pain.  PROM flexion > pain than abd   Pt tolerates elbow ext with palm supinated but with palm neutral causes shoulder pain. Also painful with elbow already extended and pronate palm.   PT simulated pushing on RW thru LUE with manual resistance.  Pt reports slight increase in pain but not as sharp as above function.    FUNCTIONAL TESTs:  Merrilee Jansky Balance Scale: 08/03/2022:  13/56 & tasks of Berg with RW support 45/56     Berg Balance Scale: 05/09/2022:  9/56 &  tasks of Berg with RW support 27/56    [facility] PT Assessment - 05/09/22 0930                Berg Balance Test    Sit to Stand Needs moderate or maximal assist to stand   task with RW support = 3    Standing Unsupported Unable to stand 30 seconds unassisted   task with RW support = 3    Sitting with Back Unsupported but Feet Supported on Floor or Stool Able to sit safely and securely 2 minutes     Stand  to Sit Uses backs of legs against chair to control descent   task with RW support = 3    Transfers Able to transfer safely, definite need of hands     Standing Unsupported with Eyes Closed Needs help to keep from falling   task with RW support = 3    Standing Unsupported with Feet Together Needs help to attain position and unable to hold for 15 seconds   task with RW support = 2    From Standing, Reach Forward with Outstretched Arm Loses balance while trying/requires external support   task with RW support = 1    From Standing Position, Pick up Object from Floor Unable to try/needs assist to keep balance   task with RW support = 2    From Standing Position, Turn to Look Behind Over each Shoulder Needs assist to keep from losing balance and falling   task with RW support = 1    Turn 360 Degrees Needs assistance while turning   task with RW support = 0    Standing Unsupported, Alternately Place Feet on Step/Stool Needs assistance to keep from falling or unable to try   task with RW support = 0    Standing Unsupported, One Foot in [employer] balance while stepping or standing   task with RW support = 1    Standing on One Leg Unable to try or needs assist to prevent fall   task with RW support = 1    Total Score 9     Berg comment: tasks of Berg with RW support 27/56                     CURRENT PROSTHETIC WEAR ASSESSMENT: 08/03/2022: Pt wears both right TTA prosthesis & left AFO partial shoe filler>90% of awake hours.  He has 2 wounds on dorsum of left foot being monitored by PCP.  He is wearing left Vive Wear compression sock below knee on LLE which has helped wounds to heal. Pt is independent in donning & doffing both prosthesis & AFO. Skin check, cleaning and minor cues on adjusting ply socks.   05/09/2022:   Patient is dependent with: skin check, residual limb care, care of non-amputated limb, prosthetic cleaning, ply sock cleaning, and correct ply sock adjustment Donning prosthesis:  Modified independence Doffing prosthesis: Modified independence Prosthetic wear tolerance: >90% of awake hours/day, 7 days/week Prosthetic weight bearing tolerance: 5 minutes Edema: LLE pitting edema with >10 sec capillary refill Residual limb condition: Right BKA limb has no openings, normal temperature & moisture.  Redness patella & patella tendon from friction of liner with knee flexed. Cylinderical shape.  LLE TMA pt's wife showed picture of wound on dorsum of foot that is being managed by PCP.   Prosthetic description: silicon liner with pin lock suspension, total contact socket, dynamic response foot.      TODAY'S  TREATMENT:  08/23/2022 Neuro Re-ed: In // bars Rocker board 1 min Ant-midline-Posterior, 1 min lateral both with BUE support 4" step toe taps with BUE back of hands touching bars Single UE blue banded rows, shoulder extension, and curls x 10 ea. with single UE support   Prosthetic Training with right TTA prosthesis & left dynamic AFO and custom shoe with toe filler and carbon plate: PT educated on short, medium, long distance walks for increasing activity and endurance and pt verbalized understanding PT discussed driving practice in empty parking lot with RLE or BLEs, with cruise control, or with hand controls. pt and wife verbalized understanding Pt amb 4' with rollator and SBA, frequent instances of Rt toe drag  08/22/2022 Neuro Re-ed: In bars Rocker board 1 min Ant-Posterior, 1 min lateral Balance on foam beam X 2 min Ambulated without UE support in bars and used the bars PRN for balance 3 round trips  Prosthetic Training with right TTA prosthesis & left dynamic AFO and custom shoe with toe filler and carbon plate: Pt amb 40' x 2 around obstacles with rollator and SBA Pt ascend and descend ramp and curb with rollator and wife CGA to SBA Ambulated in bars with Rt UE support only 2 round trips, and left UE support only 2 round trips Ambulated backwards in bars 3 round  trips.  08/17/2022 Neuro Re-ed: Standing in corner with walker -back of hands touching static stance x 30s -back of hands touching vertical, horizontal, diagonal head turns x 5 ea., increased sway -back of hands touching with eyes closed 2 x 30s with increased sway noted -static stance without UE support about 30s before his back touched the wall  Prosthetic Training with right TTA prosthesis & left dynamic AFO and custom shoe with toe filler and carbon plate: Pt amb 40' x 2 around obstacles with rollator and SBA, frequent instances of Rt toe drag Pt ascend and descend ramp and curb with rollator and wife CGA to SBA, required cueing to lock brakes for ramp but appears safe to use rollator in community with her close supervision and guarding at ramps and both verbalized comfort Pt sit to/from stand with rollator against wall with wife SBA and minA to steady rollator during sit, wife verbalized understanding with instructions for guarding during STS  08/15/2022 Vitals:  -following 40' walk with ascend curb, descend ramp, and STS away from wall onto rollator seat: pt reported SOB and SpO2 94% HR 75 bpm -following ascend ramp, descend curb & 40' walk: HR 86 bpm  Self Care: PT discussed injury risk & higher fall potential without AFO and prosthesis and encouraged constant wear even upon discharge from PT, pt verbalized understanding  Prosthetic Training with right TTA prosthesis & left dynamic AFO and custom shoe with toe filler and carbon plate: Pt amb 40', 40', 170' with rollator and SBA, frequent instances of Rt toe drag Pt ascend and descend ramp with rollator and CGA, no instances of knee buckling or losses of balance Pt ascend and descend curb with rollator and SBA and cueing for rollator management upon ascent Pt turn to sit with rollator away from wall with minA to control rollator, stand and move rollator to front of body with CGA  PT explained rollator options and asked pt to bring  pictures and item description to next appointment to determine if appropriate for use at home, pt and wife verbalized understanding   HOME EXERCISE PROGRAM  Access Code: [email or web address] URL: [email or web address] Date: 07/05/2022 Prepared by:  Jamey Reas  Exercises - Seated Correct Posture  - 1 x daily - 7 x weekly - Seated Scapular Retraction  - 2-5 x daily - 7 x weekly - 1 sets - 10 reps - 5 seconds hold - Supine Chin Tucks on Flat Ball  - 2-5 x daily - 7 x weekly - 1 sets - 10 reps - 5 seconds hold - Seated Isometric Cervical Retraction with Chin Tucks with Pillow behind head  - 2-5 x daily - 7 x weekly - 1 sets - 10 reps - 5 seconds hold - Circular Shoulder Pendulum with Table Support  - 1-2 x daily - 7 x weekly - 1-2 sets - 10 reps - 5 seconds hold - Flexion-Extension Shoulder Pendulum with Table Support  - 1-2 x daily - 7 x weekly - 1-2 sets - 10 reps - Horizontal Shoulder Pendulum with Table Support  - 1-2 x daily - 7 x weekly - 1-2 sets - 10 reps - Supine Shoulder Flexion Extension AAROM with Dowel  - 1 x daily - 7 x weekly - 1-2 sets - 10 reps - 5 seconds hold - Supine Shoulder Abduction AAROM with Dowel  - 1 x daily - 7 x weekly - 1-2 sets - 10 reps - 5 seconds hold - Supine Shoulder External Rotation with Dowel  - 1 x daily - 7 x weekly - 1-2 sets - 10 reps - 5 seconds hold   Neuromuscular Reed-  cues on proprioception RLE using residual limb, balance & standing tolerance.   Do each exercise 1-2  times per day Do each exercise 5-10 repetitions Hold each exercise for 2 seconds to feel your location  AT [place].  Try to find this position when standing still for activities.   USE TAPE ON FLOOR TO MARK THE MIDLINE POSITION which is even with middle of sink.  You also should try to feel with your limb pressure in socket.  You are trying to feel with limb what you used to feel with the bottom of your  foot.  Side to Side Shift: Moving your hips only (not shoulders): move weight onto your left leg, HOLD/FEEL pressure in socket.  Move back to equal weight on each leg, HOLD/FEEL pressure in socket. Move weight onto your right leg, HOLD/FEEL pressure in socket. Move back to equal weight on each leg, HOLD/FEEL pressure in socket. Repeat.  Start with both hands on sink, progress to hand on prosthetic side only, then no hands.  Front to Back Shift: Moving your hips only (not shoulders): move your weight forward onto your toes, HOLD/FEEL pressure in socket. Move your weight back to equal Flat Foot on both legs, HOLD/FEEL  pressure in socket. Move your weight back onto your heels, HOLD/FEEL  pressure in socket. Move your weight back to equal on both legs, HOLD/FEEL  pressure in socket. Repeat.  Start with both hands on sink, progress to hand on prosthetic side only, then no hands.  Moving Cones / Cups: With equal weight on each leg: Hold on with one hand the first time, then progress to no hand supports. Move cups from one side of sink to the other. Place cups ~2" out of your reach, progress to 10" beyond reach.  Place one hand in middle of sink and reach with other hand. Do both arms.  Then hover one hand and move cups with other hand.  Overhead/Upward Reaching: alternated reaching up  to top cabinets or ceiling if no cabinets present. Keep equal weight on each leg. Start with one hand support on counter while other hand reaches and progress to no hand support with reaching.  ace one hand in middle of sink and reach with other hand. Do both arms.  Then hover one hand and move cups with other hand.  5.   Looking Over Shoulders: With equal weight on each leg: alternate turning to look over your shoulders with one hand support on counter as needed.  Start with head motions only to look in front of shoulder, then even with shoulder and progress to looking behind you. To look to side, move head /eyes, then shoulder on  side looking pulls back, shift more weight to side looking and pull hip back. Place one hand in middle of sink and let go with other hand so your shoulder can pull back. Switch hands to look other way.   Then hover one hand and look over shoulder. If looking right, use left hand at sink. If looking left, use right hand at sink. 6.  Alternate Tapping Toes or Stepping to lower shelf of cabinet  Move items under cabinet out of your way. Shift your hips/pelvis so weight on stance leg & Tighten muscles in hip.  SLOWLY step other leg so front of foot is in cabinet. Then step back to floor. Repeat with other leg.     ASSESSMENT:   CLINICAL IMPRESSION: He tolerated session well with standing balance activity and gait with rollator. He verbalized understanding with recommendations for driving with prosthesis and/or AFO. He is continuing to benefit from skilled PT to address prosthesis care and functional limitations.  OBJECTIVE IMPAIRMENTS .Abnormal gait, cardiopulmonary status limiting activity, decreased activity tolerance, decreased balance, decreased endurance, decreased knowledge of use of DME, decreased mobility, decreased ROM, decreased strength, dizziness, increased edema, impaired flexibility, postural dysfunction, prosthetic dependency , obesity, and pain.    ACTIVITY LIMITATIONS carrying, lifting, bending, standing, stairs, transfers, locomotion level, UE use with ADLs, sleep    PARTICIPATION LIMITATIONS: driving, community activity, and yard work, UE use with ADLs &  household activities   [place], Time since onset of injury/illness/exacerbation, and 3+ comorbidities: see PMH  are also affecting patient's functional outcome.    REHAB POTENTIAL: Good   CLINICAL DECISION MAKING: Evolving/moderate complexity   EVALUATION COMPLEXITY: Moderate     GOALS: Goals reviewed with patient? Yes  SHORT TERM GOALS: Target date: 09/01/2022  1. Patient able to stand with no UE support  for 30 seconds with supervision. Baseline: SEE OBJECTIVE DATA Goal status: updated 08/03/22   2. Patient able to stand & sit from 18" without armrests using his UEs to RW safely Baseline: SEE OBJECTIVE DATA Goal status: NEW at recert  3. Patient ambulates up to 200' with RW, prosthesis & AFO with supervision.  Goal status: updated at recert 1/61/0960  4. Patient is able to negotiate a ramp & curb with RW with wife's supervision to enable basic community access without w/c.  Goal status: met 08/17/22 with rollator     UPDATED LONG TERM GOALS: Target date: 10/26/2022   Patient demonstrates & verbalized understanding of prosthetic care to enable safe utilization of prosthesis. Baseline: SEE OBJECTIVE DATA Goal status: ongoing 08/03/22    Patient tolerates right prosthesis & left AFO / partial foot prosthesis wear >90% of awake hours without skin or limb pain issues. Baseline: SEE OBJECTIVE DATA Goal status: ongoing 08/03/22    Berg Balance >/=  20/56 and tasks of Berg with RW support >/= 52/56 to indicate lower fall risk with standing ADLs Baseline: SEE OBJECTIVE DATA Goal status: 08/03/2022 updated    Patient ambulates >300' with prosthesis & LRAD modified independent Baseline: SEE OBJECTIVE DATA Goal status: ongoing 08/03/2022   Patient negotiates ramps, curbs & stairs with single rail with prosthesis & LRAD modified independent. Baseline: SEE OBJECTIVE DATA Goal status: ongoing 08/03/22  PLAN: PT FREQUENCY: 2x/week   PT DURATION: 12 weeks  PLANNED INTERVENTIONS: Therapeutic exercises, Therapeutic activity, Neuromuscular re-education, Balance training, Gait training, Patient/Family education, Self Care, Stair training, Vestibular training, Canalith repositioning, Prosthetic training, DME instructions, Re-evaluation, and physical performance testing    PLAN FOR NEXT SESSION: continue to monitor HR & SpO2 with activities. Assess STGs next week. Continue to progress standing balance  and prosthetic gait with rollator   Jana Hakim, Student-PT 08/23/2022, 12:38 PM  This entire session of physical therapy was performed under the direct supervision of PT signing evaluation /treatment. PT reviewed note and agrees.  Jamey Reas, PT, DPT 08/23/2022, 1:25 PM

## 2022-08-22 NOTE — Therapy (Signed)
OUTPATIENT PHYSICAL THERAPY TREATMENT NOTE   Patient Name: Shane [name] MRN: [id number] DOB:23-Jun-1941, 81 y.o., male Today's Date: 08/22/2022  PCP: Burnard Bunting, MD REFERRING PROVIDER: [name], MD for TTA    END OF SESSION:   PT End of Session - 08/22/22 0924     Visit Number 28    Number of Visits 68    Date for PT Re-Evaluation 10/26/22    Authorization Type UHC Medicare    Authorization Time Period [facility] MEDICARE  $20 COPAY    Progress Note Due on Visit 15    PT Start Time 0846    PT Stop Time 0930    PT Time Calculation (min) 44 min    Equipment Utilized During Treatment Gait belt    Activity Tolerance Patient tolerated treatment well;Patient limited by fatigue    Behavior During Therapy WFL for tasks assessed/performed              Past Medical History:  Diagnosis Date   Anxiety    AV block, Mobitz 1    Cataract    Chronic kidney disease    d/t DM   CKD (chronic kidney disease), stage III ([employer])    Depression    Diabetes mellitus    Vgo disposal insulin bolus  simular to insulin pump   Dyspnea    GERD (gastroesophageal reflux disease)    History of kidney stones    passed   Hyperlipidemia    Hypertension    Idiopathic pulmonary fibrosis ([place]) 11/2016   ILD (interstitial lung disease) ([place])    Moderate aortic stenosis    a. 10/2019 Echo: EF 55-60%, Gr2 DD. Nl RV.    Neuromuscular disorder ([place])    Neuropathy associated with endocrine disorder ([place])    Nonobstructive CAD (coronary artery disease)    a. 2012 Cath: mod, nonobs dzs; b. 10/2016 MV: EF 60%, no ischemia.   OSA on CPAP 05/05/2017   Unattended Home Sleep Test 7/2/813-AHI 38.6/hour, desaturation to 64%, body weight 261 pounds   PONV (postoperative nausea and vomiting)    Sleep apnea     uses cpap asked to bring mask and tubing   Past Surgical History:  Procedure Laterality Date   ABDOMINAL AORTOGRAM W/LOWER EXTREMITY N/A 12/10/2020   Procedure: ABDOMINAL AORTOGRAM W/LOWER  EXTREMITY;  Surgeon: Cherre Robins, MD;  Location: [place] [facility];  Service: Cardiovascular;  Laterality: N/A;   AMPUTATION Right 01/22/2021   Procedure: 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;  Surgeon: [name], MD;  Location: [place];  Service: Orthopedics;  Laterality: Right;   AMPUTATION Right 03/17/2021   Procedure: RIGHT BELOW KNEE AMPUTATION;  Surgeon: [name], MD;  Location: [place];  Service: Orthopedics;  Laterality: Right;   ANKLE FUSION Right 01/22/2021   Procedure: RIGHT FOOT TIBIOCALCANEAL FUSION;  Surgeon: [name], MD;  Location: [place];  Service: Orthopedics;  Laterality: Right;   ANTERIOR FUSION CERVICAL SPINE  2012   CARDIAC CATHETERIZATION  2011   CARDIAC CATHETERIZATION N/A 11/09/2016   Procedure: Right Heart Cath;  Surgeon: Belva Crome, MD;  Location: [place] [facility];  Service: Cardiovascular;  Laterality: N/A;   carpel tunnel     left wrist   CATARACT EXTRACTION     CATARACT EXTRACTION W/ INTRAOCULAR LENS  IMPLANT, BILATERAL  2013   CERVICAL LAMINECTOMY  2012   COLONOSCOPY N/A 01/14/2013   Procedure: COLONOSCOPY;  Surgeon: Irene Shipper, MD;  Location: [facility];  Service: Endoscopy;  Laterality: N/A;   CORONARY ARTERY BYPASS GRAFT  11/04/2020   LIMA-LAD, SVG-OM1, SVG-PDA ([facility]) dc 11/18/2020   EYE SURGERY     I & D EXTREMITY Right 02/19/2021   Procedure: RIGHT ANKLE DEBRIDEMENT AND PLACEMENT ANTIBIOTIC BEADS;  Surgeon: [name], MD;  Location: [place];  Service: Orthopedics;  Laterality: Right;   KNEE SURGERY  1998   left   LEFT HEART CATH AND CORONARY ANGIOGRAPHY N/A 07/10/2020   Procedure: LEFT HEART CATH AND CORONARY ANGIOGRAPHY;  Surgeon: Sherren Mocha, MD;  Location: [place] [facility];  Service: Cardiovascular;  Laterality: N/A;   LUMBAR LAMINECTOMY  2003   LUNG BIOPSY Left 12/26/2016   Procedure: LUNG BIOPSY;  Surgeon: [name], MD;  Location: [place];  Service: Thoracic;  Laterality: Left;   PACEMAKER IMPLANT N/A  03/30/2020   Procedure: PACEMAKER IMPLANT;  Surgeon: Evans Lance, MD;  Location: [place] [facility];  Service: Cardiovascular;  Laterality: N/A;   PERIPHERAL VASCULAR INTERVENTION Right 12/10/2020   Procedure: PERIPHERAL VASCULAR INTERVENTION;  Surgeon: Cherre Robins, MD;  Location: [place] [facility];  Service: Cardiovascular;  Laterality: Right;  SFA   POSTERIOR FUSION CERVICAL SPINE  2012   PPM GENERATOR REMOVAL N/A 12/14/2020   Procedure: PPM GENERATOR REMOVAL;  Surgeon: Evans Lance, MD;  Location: [place] [facility];  Service: Cardiovascular;  Laterality: N/A;   TEE WITHOUT CARDIOVERSION N/A 12/11/2020   Procedure: TRANSESOPHAGEAL ECHOCARDIOGRAM (TEE);  Surgeon: Geralynn Rile, MD;  Location: [place];  Service: Cardiovascular;  Laterality: N/A;   TRIGGER FINGER RELEASE  2011   4th finger left hand   VIDEO ASSISTED THORACOSCOPY Left 12/26/2016   Procedure: VIDEO ASSISTED THORACOSCOPY;  Surgeon: [name], MD;  Location: [place];  Service: Thoracic;  Laterality: Left;   VIDEO BRONCHOSCOPY N/A 12/26/2016   Procedure: VIDEO BRONCHOSCOPY;  Surgeon: [name], MD;  Location: [place];  Service: Thoracic;  Laterality: N/A;   Patient Active Problem List   Diagnosis Date Noted   Intermediate stage nonexudative age-related macular degeneration of both eyes 04/25/2022   Research study patient 12/24/2021   Post-operative pain    CKD (chronic kidney disease), stage II    Labile blood glucose    Pulmonary fibrosis ([place])    Hyponatremia    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[place]) 03/26/2021   S/P BKA (below knee amputation) unilateral, right ([place])    Shortness of breath    Acute blood loss anemia    Stage 3 chronic kidney disease ([place])    Atrial fibrillation ([place])    Controlled type 2 diabetes mellitus with hyperglycemia, with long-term current use of insulin (H[facility])    Postoperative pain    Postoperative hemorrhagic shock 03/20/2021   Atherosclerosis of native arteries of  extremities with gangrene, right leg ([place])    Hardware complicating wound infection ([place])    Abscess of bursa of right ankle 02/15/2021   Endocarditis 01/19/2021   Peripheral vascular disease ([place]) 12/29/2020   CHF (congestive heart failure), NYHA class II, chronic, diastolic ([place]) 49/20/1007   History of COVID-19 12/22/2020   SSS (sick sinus syndrome) ([place]) 12/22/2020   Acute bacterial endocarditis    MSSA bacteremia 12/09/2020   Diabetic foot ulcer ([place]) 12/09/2020   Foot drop, right foot    Generalized weakness 12/08/2020   Dehydration with hyponatremia 12/08/2020   Atrial fibrillation, chronic (HCC) 12/08/2020   Elevated troponin level not due myocardial infarction 12/08/2020   Adrenal insufficiency ([place])  11/14/2020   Orthostatic hypotension 11/14/2020   Physical deconditioning 11/14/2020   Difficulty sleeping 11/07/2020   Postoperative anemia due to acute blood loss 11/07/2020   Right ventricular dysfunction 11/05/2020   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/04/2020   Hearing loss 11/03/2020   Cardiac device in situ 09/09/2020   Coronary atherosclerosis due to lipid rich plaque 09/02/2020   COVID-19 virus infection 07/18/2020   Coronary artery disease involving native heart without angina pectoris 07/10/2020   Chronic kidney disease, stage 3a ([place]) 03/29/2020   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/28/2020   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 ([place]) 03/16/2020   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 ([place]) 03/16/2020   Right epiretinal membrane 03/16/2020   Posterior vitreous detachment of right eye 03/16/2020   Aortic stenosis, moderate 03/12/2020   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   Near syncope 04/27/2018   Hypoglycemia due to insulin 01/06/2018   Essential hypertension 01/06/2018   Diabetes mellitus type 2, with complication, on long term insulin pump ([place]) 01/06/2018    Syncope and collapse 01/05/2018   Encounter for therapeutic drug monitoring 06/29/2017   OSA on CPAP 05/05/2017   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[place]) 01/05/2017   Abnormal chest x-ray 10/12/2016   Benign neoplasm of colon 01/14/2013    REFERRING DIAG: Z89.511 (ICD-10-CM) - Hx of right BKA   ONSET DATE: 05/04/2022 MD visit with release to [facility] on LLE  THERAPY DIAG:  Unsteadiness on feet  Other abnormalities of gait and mobility  Muscle weakness (generalized)  Abnormal posture  History of falling  Rationale for Evaluation and Treatment Rehabilitation  PERTINENT HISTORY: CKD st III, AV block Mobitz, pacemaker, pulmonary fibrosis, CAD, CABG 21, HTN, A-Fib, DM2, PAD, right BKA, Left TMA  PRECAUTIONS: ICD/Pacemaker - continue monitoring vitals with activity and calculated HRmax is 151 bpm  SUBJECTIVE:  He does not yet have his rollator for home as it has not arrived yet. He denies pain today, he says he was able to go up stairs with one hand rail at home and walk down the hall using the wall but no other AD.  PAIN:    Are you having pain? Yes: NPRS scale: today 0/10, over last week lowest 0/10  Pain location: left shoulder lateral just distal to joint Pain description: tingling when shoulder in improper posture. Aggravating factors: improper posture. Relieving factors: correcting posture   OBJECTIVE: (objective measures completed at initial evaluation unless otherwise dated)  COGNITION: Overall cognitive status: Within functional limits for tasks assessed   POSTURE: 05/09/2022:  rounded shoulders, forward head, and flexed trunk    LOWER EXTREMITY ROM:    UPPER EXTREMITY ROM:  ROM P:passive  A:active Left 06/23/22 Supine all limited by pain Left 08/01/22 Seated All painfree Right 08/01/22 Seated  Shoulder flexion 30* A: 148 A: 153  Shoulder extension 0* neutral A: 72 A: 74  Shoulder abduction 45* A: 135 A: 141  Shoulder adduction     Shoulder internal rotation  20* A: 84 A: 89  Shoulder external rotation 10* A: 88 A: 83  Elbow flexion     Elbow extension -15*     (Blank rows = not tested)    ROM P:passive  A:active Right Eval 05/09/2022 Left Eval 05/09/2022 Bilateral 08/03/22  Hip flexion       Hip extension A: -25* standing A: -25* standing A: -15*  RW support standing  Hip abduction  Hip adduction       Hip internal rotation       Hip external rotation       Knee flexion       Knee extension       Ankle dorsiflexion NA P: -17*   Ankle plantarflexion NA     Ankle inversion NA     Ankle eversion NA      (Blank rows = not tested)   LOWER EXTREMITY MMT:   MMT Right Eval 05/09/2022 Left Eval 05/09/2022  Hip flexion      Hip extension Functional testing 3-/5 Functional testing 3-/5  Hip abduction Functional testing 3-/5 Functional testing 3-/5  Hip adduction      Hip internal rotation      Hip external rotation      Knee flexion Functional testing 3-/5 Functional testing 3-/5  Knee extension Functional testing 3/5 Functional testing 3/5  Ankle dorsiflexion      Ankle plantarflexion      Ankle inversion      Ankle eversion      (Blank rows = not tested)   POSTURE:  06/23/2022  seated - head forward, rounded shoulders and posterior pelvic tilt  TRANSFERS: 05/09/2022:  Sit to stand: SBA and requires use of armrests from 20" stable w/c & needs RW for stabilization 05/09/2022:  Stand to sit: SBA and requires use of armrests from 20" stable w/c & needs RW for stabilization   GAIT: 08/11/2022: Pt amb 100' with rollator, right TTA prosthesis & left AFO partial shoe filler with SBA. He demonstrates increased step length bil., intermittent impaired foot clearance on RLE  08/03/2022: 2-minute Walk Test with RW, right TTA prosthesis & left AFO partial shoe filler. Resting pre HR 70 SpO2 100% HR after activity HR 52  SpO2 100% light amount of light headness Distance 158' with supervision.   Gait pattern: step-to pattern, but distance  improved since evaluation, Right hip hike, trunk flexed, and normal 4" BOS  Pt neg ramp & curb with RW, ight TTA prosthesis & left AFO partial shoe filler with supervision. He required seated rest half way due to fatigue.    05/09/2022:  Gait pattern: step to pattern, decreased step length- Left, decreased stance time- Right, Right hip hike, antalgic, lateral hip instability, trunk flexed, and wide BOS Distance walked: 25' Assistive device utilized: Environmental consultant - 2 wheeled and TTA prosthesis RLE Level of assistance: Min A Comments: pt reports dizziness with turning in gait   UE FUNCTION: 06/23/2022:  Pt reports that he uses RUE for fine motor like writing but uses both hands for gross motor or sports.  Seated - pt unable to raise LUE in flexion or abd due to pain.  Supine - pt unable to move LUE in flexion or abd due to pain.  PROM flexion > pain than abd   Pt tolerates elbow ext with palm supinated but with palm neutral causes shoulder pain. Also painful with elbow already extended and pronate palm.   PT simulated pushing on RW thru LUE with manual resistance.  Pt reports slight increase in pain but not as sharp as above function.    FUNCTIONAL TESTs:  Merrilee Jansky Balance Scale: 08/03/2022:  13/56 & tasks of Berg with RW support 45/56     Berg Balance Scale: 05/09/2022:  9/56 & tasks of Berg with RW support 27/56    [facility] PT Assessment - 05/09/22 0930  Berg Balance Test    Sit to Stand Needs moderate or maximal assist to stand   task with RW support = 3    Standing Unsupported Unable to stand 30 seconds unassisted   task with RW support = 3    Sitting with Back Unsupported but Feet Supported on Floor or Stool Able to sit safely and securely 2 minutes     Stand to Sit Uses backs of legs against chair to control descent   task with RW support = 3    Transfers Able to transfer safely, definite need of hands     Standing Unsupported with Eyes Closed Needs help to keep from falling   task  with RW support = 3    Standing Unsupported with Feet Together Needs help to attain position and unable to hold for 15 seconds   task with RW support = 2    From Standing, Reach Forward with Outstretched Arm Loses balance while trying/requires external support   task with RW support = 1    From Standing Position, Pick up Object from Floor Unable to try/needs assist to keep balance   task with RW support = 2    From Standing Position, Turn to Look Behind Over each Shoulder Needs assist to keep from losing balance and falling   task with RW support = 1    Turn 360 Degrees Needs assistance while turning   task with RW support = 0    Standing Unsupported, Alternately Place Feet on Step/Stool Needs assistance to keep from falling or unable to try   task with RW support = 0    Standing Unsupported, One Foot in [employer] balance while stepping or standing   task with RW support = 1    Standing on One Leg Unable to try or needs assist to prevent fall   task with RW support = 1    Total Score 9     Berg comment: tasks of Berg with RW support 27/56                     CURRENT PROSTHETIC WEAR ASSESSMENT: 08/03/2022: Pt wears both right TTA prosthesis & left AFO partial shoe filler>90% of awake hours.  He has 2 wounds on dorsum of left foot being monitored by PCP.  He is wearing left Vive Wear compression sock below knee on LLE which has helped wounds to heal. Pt is independent in donning & doffing both prosthesis & AFO. Skin check, cleaning and minor cues on adjusting ply socks.   05/09/2022:   Patient is dependent with: skin check, residual limb care, care of non-amputated limb, prosthetic cleaning, ply sock cleaning, and correct ply sock adjustment Donning prosthesis: Modified independence Doffing prosthesis: Modified independence Prosthetic wear tolerance: >90% of awake hours/day, 7 days/week Prosthetic weight bearing tolerance: 5 minutes Edema: LLE pitting edema with >10 sec capillary  refill Residual limb condition: Right BKA limb has no openings, normal temperature & moisture.  Redness patella & patella tendon from friction of liner with knee flexed. Cylinderical shape.  LLE TMA pt's wife showed picture of wound on dorsum of foot that is being managed by PCP.   Prosthetic description: silicon liner with pin lock suspension, total contact socket, dynamic response foot.      TODAY'S TREATMENT:  08/22/2022 Neuro Re-ed: In bars Rocker board 1 min Ant-Posterior, 1 min lateral Balance on foam beam X 2 min Ambulated without UE support in bars and used the  bars PRN for balance 3 round trips  Prosthetic Training with right TTA prosthesis & left dynamic AFO and custom shoe with toe filler and carbon plate: Pt amb 40' x 2 around obstacles with rollator and SBA Pt ascend and descend ramp and curb with rollator and wife CGA to SBA Ambulated in bars with Rt UE support only 2 round trips, and left UE support only 2 round trips Ambulated backwards in bars 3 round trips.  08/17/2022 Neuro Re-ed: Standing in corner with walker -back of hands touching static stance x 30s -back of hands touching vertical, horizontal, diagonal head turns x 5 ea., increased sway -back of hands touching with eyes closed 2 x 30s with increased sway noted -static stance without UE support about 30s before his back touched the wall  Prosthetic Training with right TTA prosthesis & left dynamic AFO and custom shoe with toe filler and carbon plate: Pt amb 40' x 2 around obstacles with rollator and SBA, frequent instances of Rt toe drag Pt ascend and descend ramp and curb with rollator and wife CGA to SBA, required cueing to lock brakes for ramp but appears safe to use rollator in community with her close supervision and guarding at ramps and both verbalized comfort Pt sit to/from stand with rollator against wall with wife SBA and minA to steady rollator during sit, wife verbalized understanding with instructions  for guarding during STS  08/15/2022 Vitals:  -following 40' walk with ascend curb, descend ramp, and STS away from wall onto rollator seat: pt reported SOB and SpO2 94% HR 75 bpm -following ascend ramp, descend curb & 40' walk: HR 86 bpm  Self Care: PT discussed injury risk & higher fall potential without AFO and prosthesis and encouraged constant wear even upon discharge from PT, pt verbalized understanding  Prosthetic Training with right TTA prosthesis & left dynamic AFO and custom shoe with toe filler and carbon plate: Pt amb 40', 40', 170' with rollator and SBA, frequent instances of Rt toe drag Pt ascend and descend ramp with rollator and CGA, no instances of knee buckling or losses of balance Pt ascend and descend curb with rollator and SBA and cueing for rollator management upon ascent Pt turn to sit with rollator away from wall with minA to control rollator, stand and move rollator to front of body with CGA  PT explained rollator options and asked pt to bring pictures and item description to next appointment to determine if appropriate for use at home, pt and wife verbalized understanding  08/11/2022 Vitals:  -following 100' walk and STS against wall: pt reported SOB and SpO2 93% -following ramp and STS not against wall: pt reported SOB and SpO2 91% HR 77 bpm  -after 1 min: SpO2 93% HR 62 bpm  -after 3 min: SpO2 95% HR 60 bpm  Prosthetic Training with right TTA prosthesis & left dynamic AFO and custom shoe with toe filler and carbon plate: PT visual and verbal explanation of safe use of rollator for mobility, STS, ramp, and curb with additional recommendations for prosthesis management with STS and ramp/curb - pt returned demo Pt amb 100', 20', 30' with rollator and SBA, verbal cueing for reduced step length and speed, using brakes Pt sit to/from stand with rollator against wall with SBA, sit to/from stand with rollator away from wall with minA to control rollator Pt sit to/from  stand in chair without armrests x1 SBA and x1 minA for ascent, SBA descent Pt ascend and descend curb with rollator  and SBA Pt ascend ramp SBA and descend ramp with CGA with one instance of Rt knee buckling, Rt brake came unlocked during ramp negotiation   HOME EXERCISE PROGRAM  Access Code: [email or web address] URL: [email or web address] Date: 07/05/2022 Prepared by: Jamey Reas  Exercises - Seated Correct Posture  - 1 x daily - 7 x weekly - Seated Scapular Retraction  - 2-5 x daily - 7 x weekly - 1 sets - 10 reps - 5 seconds hold - Supine Chin Tucks on Flat Ball  - 2-5 x daily - 7 x weekly - 1 sets - 10 reps - 5 seconds hold - Seated Isometric Cervical Retraction with Chin Tucks with Pillow behind head  - 2-5 x daily - 7 x weekly - 1 sets - 10 reps - 5 seconds hold - Circular Shoulder Pendulum with Table Support  - 1-2 x daily - 7 x weekly - 1-2 sets - 10 reps - 5 seconds hold - Flexion-Extension Shoulder Pendulum with Table Support  - 1-2 x daily - 7 x weekly - 1-2 sets - 10 reps - Horizontal Shoulder Pendulum with Table Support  - 1-2 x daily - 7 x weekly - 1-2 sets - 10 reps - Supine Shoulder Flexion Extension AAROM with Dowel  - 1 x daily - 7 x weekly - 1-2 sets - 10 reps - 5 seconds hold - Supine Shoulder Abduction AAROM with Dowel  - 1 x daily - 7 x weekly - 1-2 sets - 10 reps - 5 seconds hold - Supine Shoulder External Rotation with Dowel  - 1 x daily - 7 x weekly - 1-2 sets - 10 reps - 5 seconds hold   Neuromuscular Reed-  cues on proprioception RLE using residual limb, balance & standing tolerance.   Do each exercise 1-2  times per day Do each exercise 5-10 repetitions Hold each exercise for 2 seconds to feel your location  AT [place].  Try to find this position when standing still for activities.   USE TAPE ON FLOOR TO MARK THE MIDLINE POSITION which is even with middle of sink.  You also should  try to feel with your limb pressure in socket.  You are trying to feel with limb what you used to feel with the bottom of your foot.  Side to Side Shift: Moving your hips only (not shoulders): move weight onto your left leg, HOLD/FEEL pressure in socket.  Move back to equal weight on each leg, HOLD/FEEL pressure in socket. Move weight onto your right leg, HOLD/FEEL pressure in socket. Move back to equal weight on each leg, HOLD/FEEL pressure in socket. Repeat.  Start with both hands on sink, progress to hand on prosthetic side only, then no hands.  Front to Back Shift: Moving your hips only (not shoulders): move your weight forward onto your toes, HOLD/FEEL pressure in socket. Move your weight back to equal Flat Foot on both legs, HOLD/FEEL  pressure in socket. Move your weight back onto your heels, HOLD/FEEL  pressure in socket. Move your weight back to equal on both legs, HOLD/FEEL  pressure in socket. Repeat.  Start with both hands on sink, progress to hand on prosthetic side only, then no hands.  Moving Cones / Cups: With equal weight on each leg: Hold on with one hand the first time, then progress to no hand supports. Move cups from one side of sink to the other. Place cups ~2" out of  your reach, progress to 10" beyond reach.  Place one hand in middle of sink and reach with other hand. Do both arms.  Then hover one hand and move cups with other hand.  Overhead/Upward Reaching: alternated reaching up to top cabinets or ceiling if no cabinets present. Keep equal weight on each leg. Start with one hand support on counter while other hand reaches and progress to no hand support with reaching.  ace one hand in middle of sink and reach with other hand. Do both arms.  Then hover one hand and move cups with other hand.  5.   Looking Over Shoulders: With equal weight on each leg: alternate turning to look over your shoulders with one hand support on counter as needed.  Start with head motions only to look in front  of shoulder, then even with shoulder and progress to looking behind you. To look to side, move head /eyes, then shoulder on side looking pulls back, shift more weight to side looking and pull hip back. Place one hand in middle of sink and let go with other hand so your shoulder can pull back. Switch hands to look other way.   Then hover one hand and look over shoulder. If looking right, use left hand at sink. If looking left, use right hand at sink. 6.  Alternate Tapping Toes or Stepping to lower shelf of cabinet  Move items under cabinet out of your way. Shift your hips/pelvis so weight on stance leg & Tighten muscles in hip.  SLOWLY step other leg so front of foot is in cabinet. Then step back to floor. Repeat with other leg.     ASSESSMENT:   CLINICAL IMPRESSION: We worked on static and dynamic balance challenges today as well as gait with rollator to navigate curb and ramp. He does fatigue quickly but vitals were monitored and stable HR noted, unable to get reading on SP02 today so I adjusted workload based on his breathing rate. PT recommending to continue current plan.   OBJECTIVE IMPAIRMENTS .Abnormal gait, cardiopulmonary status limiting activity, decreased activity tolerance, decreased balance, decreased endurance, decreased knowledge of use of DME, decreased mobility, decreased ROM, decreased strength, dizziness, increased edema, impaired flexibility, postural dysfunction, prosthetic dependency , obesity, and pain.    ACTIVITY LIMITATIONS carrying, lifting, bending, standing, stairs, transfers, locomotion level, UE use with ADLs, sleep    PARTICIPATION LIMITATIONS: driving, community activity, and yard work, UE use with ADLs &  household activities   [place], Time since onset of injury/illness/exacerbation, and 3+ comorbidities: see PMH  are also affecting patient's functional outcome.    REHAB POTENTIAL: Good   CLINICAL DECISION MAKING: Evolving/moderate complexity    EVALUATION COMPLEXITY: Moderate     GOALS: Goals reviewed with patient? Yes  SHORT TERM GOALS: Target date: 09/01/2022  1. Patient able to stand with no UE support for 30 seconds with supervision. Baseline: SEE OBJECTIVE DATA Goal status: updated 08/03/22   2. Patient able to stand & sit from 18" without armrests using his UEs to RW safely Baseline: SEE OBJECTIVE DATA Goal status: NEW at recert  3. Patient ambulates up to 200' with RW, prosthesis & AFO with supervision.  Goal status: updated at recert 7/86/7672  4. Patient is able to negotiate a ramp & curb with RW with wife's supervision to enable basic community access without w/c.  Goal status: met 08/17/22 with rollator     UPDATED LONG TERM GOALS: Target date: 10/26/2022   Patient demonstrates &  verbalized understanding of prosthetic care to enable safe utilization of prosthesis. Baseline: SEE OBJECTIVE DATA Goal status: ongoing 08/03/22    Patient tolerates right prosthesis & left AFO / partial foot prosthesis wear >90% of awake hours without skin or limb pain issues. Baseline: SEE OBJECTIVE DATA Goal status: ongoing 08/03/22    Berg Balance >/= 20/56 and tasks of Berg with RW support >/= 52/56 to indicate lower fall risk with standing ADLs Baseline: SEE OBJECTIVE DATA Goal status: 08/03/2022 updated    Patient ambulates >300' with prosthesis & LRAD modified independent Baseline: SEE OBJECTIVE DATA Goal status: ongoing 08/03/2022   Patient negotiates ramps, curbs & stairs with single rail with prosthesis & LRAD modified independent. Baseline: SEE OBJECTIVE DATA Goal status: ongoing 08/03/22  PLAN: PT FREQUENCY: 2x/week   PT DURATION: 12 weeks  PLANNED INTERVENTIONS: Therapeutic exercises, Therapeutic activity, Neuromuscular re-education, Balance training, Gait training, Patient/Family education, Self Care, Stair training, Vestibular training, Canalith repositioning, Prosthetic training, DME instructions,  Re-evaluation, and physical performance testing    PLAN FOR NEXT SESSION: continue to monitor HR & SpO2 with activities. Progress LE strengthening for STS and standing balance   Debbe Odea, PT,DPT 08/22/2022, 9:26 AM

## 2022-08-23 ENCOUNTER — Ambulatory Visit (INDEPENDENT_AMBULATORY_CARE_PROVIDER_SITE_OTHER): Payer: Medicare Other | Admitting: Physical Therapy

## 2022-08-23 ENCOUNTER — Encounter: Payer: Self-pay | Admitting: Physical Therapy

## 2022-08-23 DIAGNOSIS — R293 Abnormal posture: Secondary | ICD-10-CM

## 2022-08-23 DIAGNOSIS — Z9181 History of falling: Secondary | ICD-10-CM

## 2022-08-23 DIAGNOSIS — R2689 Other abnormalities of gait and mobility: Secondary | ICD-10-CM | POA: Diagnosis not present

## 2022-08-23 DIAGNOSIS — M6281 Muscle weakness (generalized): Secondary | ICD-10-CM

## 2022-08-23 DIAGNOSIS — R2681 Unsteadiness on feet: Secondary | ICD-10-CM

## 2022-08-30 ENCOUNTER — Encounter: Payer: Self-pay | Admitting: Physical Therapy

## 2022-08-30 ENCOUNTER — Ambulatory Visit (INDEPENDENT_AMBULATORY_CARE_PROVIDER_SITE_OTHER): Payer: Medicare Other | Admitting: Physical Therapy

## 2022-08-30 DIAGNOSIS — R293 Abnormal posture: Secondary | ICD-10-CM

## 2022-08-30 DIAGNOSIS — R2689 Other abnormalities of gait and mobility: Secondary | ICD-10-CM

## 2022-08-30 DIAGNOSIS — M6281 Muscle weakness (generalized): Secondary | ICD-10-CM

## 2022-08-30 DIAGNOSIS — R2681 Unsteadiness on feet: Secondary | ICD-10-CM | POA: Diagnosis not present

## 2022-08-30 NOTE — Therapy (Signed)
OUTPATIENT PHYSICAL THERAPY TREATMENT NOTE   Patient Name: Shane [name] MRN: [id number] DOB:[birth year], 81 y.o., male Today's Date: 08/30/2022  PCP: Burnard Bunting, MD REFERRING PROVIDER: [name], MD    END OF SESSION:   PT End of Session - 08/30/22 0805     Visit Number 30    Number of Visits 1    Date for PT Re-Evaluation 10/26/22    Authorization Type UHC Medicare    Authorization Time Period [facility] MEDICARE  $20 COPAY    Progress Note Due on Visit 33    PT Start Time 0802    PT Stop Time 0843    PT Time Calculation (min) 41 min    Equipment Utilized During Treatment Gait belt    Activity Tolerance Patient tolerated treatment well;Patient limited by fatigue    Behavior During Therapy WFL for tasks assessed/performed              Past Medical History:  Diagnosis Date   Anxiety    AV block, Mobitz 1    Cataract    Chronic kidney disease    d/t DM   CKD (chronic kidney disease), stage III ([place])    Depression    Diabetes mellitus    Vgo disposal insulin bolus  simular to insulin pump   Dyspnea    GERD (gastroesophageal reflux disease)    History of kidney stones    passed   Hyperlipidemia    Hypertension    Idiopathic pulmonary fibrosis ([place]) 11/2016   ILD (interstitial lung disease) ([place])    Moderate aortic stenosis    a. 10/2019 Echo: EF 55-60%, Gr2 DD. Nl RV.    Neuromuscular disorder ([place])    Neuropathy associated with endocrine disorder ([place])    Nonobstructive CAD (coronary artery disease)    a. 2012 Cath: mod, nonobs dzs; b. 10/2016 MV: EF 60%, no ischemia.   OSA on CPAP 05/05/2017   Unattended Home Sleep Test 7/2/813-AHI 38.6/hour, desaturation to 64%, body weight 261 pounds   PONV (postoperative nausea and vomiting)    Sleep apnea     uses cpap asked to bring mask and tubing   Past Surgical History:  Procedure Laterality Date   ABDOMINAL AORTOGRAM W/LOWER EXTREMITY N/A 12/10/2020   Procedure: ABDOMINAL AORTOGRAM W/LOWER EXTREMITY;   Surgeon: Cherre Robins, MD;  Location: [place] [facility];  Service: Cardiovascular;  Laterality: N/A;   AMPUTATION Right 01/22/2021   Procedure: 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;  Surgeon: [name], MD;  Location: [place];  Service: Orthopedics;  Laterality: Right;   AMPUTATION Right 03/17/2021   Procedure: RIGHT BELOW KNEE AMPUTATION;  Surgeon: [name], MD;  Location: [place];  Service: Orthopedics;  Laterality: Right;   ANKLE FUSION Right 01/22/2021   Procedure: RIGHT FOOT TIBIOCALCANEAL FUSION;  Surgeon: [name], MD;  Location: [place];  Service: Orthopedics;  Laterality: Right;   ANTERIOR FUSION CERVICAL SPINE  2012   CARDIAC CATHETERIZATION  2011   CARDIAC CATHETERIZATION N/A 11/09/2016   Procedure: Right Heart Cath;  Surgeon: Belva Crome, MD;  Location: [place] [facility];  Service: Cardiovascular;  Laterality: N/A;   carpel tunnel     left wrist   CATARACT EXTRACTION     CATARACT EXTRACTION W/ INTRAOCULAR LENS  IMPLANT, BILATERAL  2013   CERVICAL LAMINECTOMY  2012   COLONOSCOPY N/A 01/14/2013   Procedure: COLONOSCOPY;  Surgeon: Irene Shipper, MD;  Location: [facility];  Service: Endoscopy;  Laterality: N/A;  CORONARY ARTERY BYPASS GRAFT  11/04/2020   LIMA-LAD, SVG-OM1, SVG-PDA (Dr Mar[facility]) dc 11/18/2020   EYE SURGERY     I & D EXTREMITY Right 02/19/2021   Procedure: RIGHT ANKLE DEBRIDEMENT AND PLACEMENT ANTIBIOTIC BEADS;  Surgeon: [name], MD;  Location: [place];  Service: Orthopedics;  Laterality: Right;   KNEE SURGERY  1998   left   LEFT HEART CATH AND CORONARY ANGIOGRAPHY N/A 07/10/2020   Procedure: LEFT HEART CATH AND CORONARY ANGIOGRAPHY;  Surgeon: Sherren Mocha, MD;  Location: [place] [facility];  Service: Cardiovascular;  Laterality: N/A;   LUMBAR LAMINECTOMY  2003   LUNG BIOPSY Left 12/26/2016   Procedure: LUNG BIOPSY;  Surgeon: [name], MD;  Location: [place];  Service: Thoracic;  Laterality: Left;   PACEMAKER IMPLANT N/A 03/30/2020    Procedure: PACEMAKER IMPLANT;  Surgeon: Evans Lance, MD;  Location: [place] [facility];  Service: Cardiovascular;  Laterality: N/A;   PERIPHERAL VASCULAR INTERVENTION Right 12/10/2020   Procedure: PERIPHERAL VASCULAR INTERVENTION;  Surgeon: Cherre Robins, MD;  Location: [place] [facility];  Service: Cardiovascular;  Laterality: Right;  SFA   POSTERIOR FUSION CERVICAL SPINE  2012   PPM GENERATOR REMOVAL N/A 12/14/2020   Procedure: PPM GENERATOR REMOVAL;  Surgeon: Evans Lance, MD;  Location: [place] [facility];  Service: Cardiovascular;  Laterality: N/A;   TEE WITHOUT CARDIOVERSION N/A 12/11/2020   Procedure: TRANSESOPHAGEAL ECHOCARDIOGRAM (TEE);  Surgeon: Geralynn Rile, MD;  Location: [place];  Service: Cardiovascular;  Laterality: N/A;   TRIGGER FINGER RELEASE  2011   4th finger left hand   VIDEO ASSISTED THORACOSCOPY Left 12/26/2016   Procedure: VIDEO ASSISTED THORACOSCOPY;  Surgeon: [name], MD;  Location: [place];  Service: Thoracic;  Laterality: Left;   VIDEO BRONCHOSCOPY N/A 12/26/2016   Procedure: VIDEO BRONCHOSCOPY;  Surgeon: [name], MD;  Location: [place];  Service: Thoracic;  Laterality: N/A;   Patient Active Problem List   Diagnosis Date Noted   Intermediate stage nonexudative age-related macular degeneration of both eyes 04/25/2022   Research study patient 12/24/2021   Post-operative pain    CKD (chronic kidney disease), stage II    Labile blood glucose    Pulmonary fibrosis ([place])    Hyponatremia    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[place]) 03/26/2021   S/P BKA (below knee amputation) unilateral, right ([place])    Shortness of breath    Acute blood loss anemia    Stage 3 chronic kidney disease ([place])    Atrial fibrillation ([place])    Controlled type 2 diabetes mellitus with hyperglycemia, with long-term current use of insulin (HCC)    Postoperative pain    Postoperative hemorrhagic shock 03/20/2021   Atherosclerosis of native arteries of extremities  with gangrene, right leg ([place])    Hardware complicating wound infection ([place])    Abscess of bursa of right ankle 02/15/2021   Endocarditis 01/19/2021   Peripheral vascular disease ([place]) 12/29/2020   CHF (congestive heart failure), NYHA class II, chronic, diastolic ([place]) 58/85/0277   History of COVID-19 12/22/2020   SSS (sick sinus syndrome) ([place]) 12/22/2020   Acute bacterial endocarditis    MSSA bacteremia 12/09/2020   Diabetic foot ulcer ([place]) 12/09/2020   Foot drop, right foot    Generalized weakness 12/08/2020   Dehydration with hyponatremia 12/08/2020   Atrial fibrillation, chronic (HCC) 12/08/2020   Elevated troponin level not due myocardial infarction 12/08/2020   Adrenal insufficiency ([place]) 11/14/2020   Orthostatic  hypotension 11/14/2020   Physical deconditioning 11/14/2020   Difficulty sleeping 11/07/2020   Postoperative anemia due to acute blood loss 11/07/2020   Right ventricular dysfunction 11/05/2020   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/04/2020   Hearing loss 11/03/2020   Cardiac device in situ 09/09/2020   Coronary atherosclerosis due to lipid rich plaque 09/02/2020   COVID-19 virus infection 07/18/2020   Coronary artery disease involving native heart without angina pectoris 07/10/2020   Chronic kidney disease, stage 3a ([place]) 03/29/2020   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/28/2020   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 ([place]) 03/16/2020   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 ([place]) 03/16/2020   Right epiretinal membrane 03/16/2020   Posterior vitreous detachment of right eye 03/16/2020   Aortic stenosis, moderate 03/12/2020   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   Near syncope 04/27/2018   Hypoglycemia due to insulin 01/06/2018   Essential hypertension 01/06/2018   Diabetes mellitus type 2, with complication, on long term insulin pump ([place]) 01/06/2018   Syncope and  collapse 01/05/2018   Encounter for therapeutic drug monitoring 06/29/2017   OSA on CPAP 05/05/2017   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[place]) 01/05/2017   Abnormal chest x-ray 10/12/2016   Benign neoplasm of colon 01/14/2013    REFERRING DIAG: Z89.511 (ICD-10-CM) - Hx of right BKA   ONSET DATE: 05/04/2022 MD visit with release to [facility] on LLE  THERAPY DIAG:  Unsteadiness on feet  Other abnormalities of gait and mobility  Muscle weakness (generalized)  Abnormal posture  Rationale for Evaluation and Treatment Rehabilitation  PERTINENT HISTORY: CKD st III, AV block Mobitz, pacemaker, pulmonary fibrosis, CAD, CABG 21, HTN, A-Fib, DM2, PAD, right BKA, Left TMA  PRECAUTIONS: ICD/Pacemaker - continue monitoring vitals with activity and calculated HRmax is 151 bpm  SUBJECTIVE:  He saw Brooke Pace, [facility] who adjusted both AFO & prosthesis.  He also got new rollator and it works well even on grass and his posture is better.   PAIN:    Are you having pain? Yes: NPRS scale: today  0/10, over last week lowest 0/10  Pain location: left shoulder lateral just distal to joint Pain description: tingling when shoulder in improper posture. Aggravating factors: improper posture. Relieving factors: correcting posture   OBJECTIVE: (objective measures completed at initial evaluation unless otherwise dated)  COGNITION: Overall cognitive status: Within functional limits for tasks assessed   POSTURE: 05/09/2022:  rounded shoulders, forward head, and flexed trunk    LOWER EXTREMITY ROM:    UPPER EXTREMITY ROM:  ROM P:passive  A:active Left 06/23/22 Supine all limited by pain Left 08/01/22 Seated All painfree Right 08/01/22 Seated  Shoulder flexion 30* A: 148 A: 153  Shoulder extension 0* neutral A: 72 A: 74  Shoulder abduction 45* A: 135 A: 141  Shoulder adduction     Shoulder internal rotation 20* A: 84 A: 89  Shoulder external rotation 10* A: 88 A: 83  Elbow flexion     Elbow extension  -15*     (Blank rows = not tested)    ROM P:passive  A:active Right Eval 05/09/2022 Left Eval 05/09/2022 Bilateral 08/03/22  Hip flexion       Hip extension A: -25* standing A: -25* standing A: -15*  RW support standing  Hip abduction       Hip adduction       Hip internal rotation       Hip  external rotation       Knee flexion       Knee extension       Ankle dorsiflexion NA P: -17*   Ankle plantarflexion NA     Ankle inversion NA     Ankle eversion NA      (Blank rows = not tested)   LOWER EXTREMITY MMT:   MMT Right Eval 05/09/2022 Left Eval 05/09/2022  Hip flexion      Hip extension Functional testing 3-/5 Functional testing 3-/5  Hip abduction Functional testing 3-/5 Functional testing 3-/5  Hip adduction      Hip internal rotation      Hip external rotation      Knee flexion Functional testing 3-/5 Functional testing 3-/5  Knee extension Functional testing 3/5 Functional testing 3/5  Ankle dorsiflexion      Ankle plantarflexion      Ankle inversion      Ankle eversion      (Blank rows = not tested)   POSTURE:  06/23/2022  seated - head forward, rounded shoulders and posterior pelvic tilt  TRANSFERS: 05/09/2022:  Sit to stand: SBA and requires use of armrests from 20" stable w/c & needs RW for stabilization 05/09/2022:  Stand to sit: SBA and requires use of armrests from 20" stable w/c & needs RW for stabilization   GAIT: 08/11/2022: Pt amb 100' with rollator, right TTA prosthesis & left AFO partial shoe filler with SBA. He demonstrates increased step length bil., intermittent impaired foot clearance on RLE  08/03/2022: 2-minute Walk Test with RW, right TTA prosthesis & left AFO partial shoe filler. Resting pre HR 70 SpO2 100% HR after activity HR 52  SpO2 100% light amount of light headness Distance 158' with supervision.   Gait pattern: step-to pattern, but distance improved since evaluation, Right hip hike, trunk flexed, and normal 4" BOS  Pt neg ramp & curb with  RW, ight TTA prosthesis & left AFO partial shoe filler with supervision. He required seated rest half way due to fatigue.    05/09/2022:  Gait pattern: step to pattern, decreased step length- Left, decreased stance time- Right, Right hip hike, antalgic, lateral hip instability, trunk flexed, and wide BOS Distance walked: 25' Assistive device utilized: Environmental consultant - 2 wheeled and TTA prosthesis RLE Level of assistance: Min A Comments: pt reports dizziness with turning in gait   UE FUNCTION: 06/23/2022:  Pt reports that he uses RUE for fine motor like writing but uses both hands for gross motor or sports.  Seated - pt unable to raise LUE in flexion or abd due to pain.  Supine - pt unable to move LUE in flexion or abd due to pain.  PROM flexion > pain than abd   Pt tolerates elbow ext with palm supinated but with palm neutral causes shoulder pain. Also painful with elbow already extended and pronate palm.   PT simulated pushing on RW thru LUE with manual resistance.  Pt reports slight increase in pain but not as sharp as above function.    FUNCTIONAL TESTs:  Sharlene Motts Balance Scale: 08/03/2022:  13/56 & tasks of Berg with RW support 45/56     Berg Balance Scale: 05/09/2022:  9/56 & tasks of Berg with RW support 27/56    [facility] PT Assessment - 05/09/22 0930                Berg Balance Test    Sit to Stand Needs moderate or maximal assist to stand  task with RW support = 3    Standing Unsupported Unable to stand 30 seconds unassisted   task with RW support = 3    Sitting with Back Unsupported but Feet Supported on Floor or Stool Able to sit safely and securely 2 minutes     Stand to Sit Uses backs of legs against chair to control descent   task with RW support = 3    Transfers Able to transfer safely, definite need of hands     Standing Unsupported with Eyes Closed Needs help to keep from falling   task with RW support = 3    Standing Unsupported with Feet Together Needs help to attain position and  unable to hold for 15 seconds   task with RW support = 2    From Standing, Reach Forward with Outstretched Arm Loses balance while trying/requires external support   task with RW support = 1    From Standing Position, Pick up Object from Floor Unable to try/needs assist to keep balance   task with RW support = 2    From Standing Position, Turn to Look Behind Over each Shoulder Needs assist to keep from losing balance and falling   task with RW support = 1    Turn 360 Degrees Needs assistance while turning   task with RW support = 0    Standing Unsupported, Alternately Place Feet on Step/Stool Needs assistance to keep from falling or unable to try   task with RW support = 0    Standing Unsupported, One Foot in [employer] balance while stepping or standing   task with RW support = 1    Standing on One Leg Unable to try or needs assist to prevent fall   task with RW support = 1    Total Score 9     Berg comment: tasks of Berg with RW support 27/56                     CURRENT PROSTHETIC WEAR ASSESSMENT: 08/03/2022: Pt wears both right TTA prosthesis & left AFO partial shoe filler>90% of awake hours.  He has 2 wounds on dorsum of left foot being monitored by PCP.  He is wearing left Vive Wear compression sock below knee on LLE which has helped wounds to heal. Pt is independent in donning & doffing both prosthesis & AFO. Skin check, cleaning and minor cues on adjusting ply socks.   05/09/2022:   Patient is dependent with: skin check, residual limb care, care of non-amputated limb, prosthetic cleaning, ply sock cleaning, and correct ply sock adjustment Donning prosthesis: Modified independence Doffing prosthesis: Modified independence Prosthetic wear tolerance: >90% of awake hours/day, 7 days/week Prosthetic weight bearing tolerance: 5 minutes Edema: LLE pitting edema with >10 sec capillary refill Residual limb condition: Right BKA limb has no openings, normal temperature & moisture.  Redness  patella & patella tendon from friction of liner with knee flexed. Cylinderical shape.  LLE TMA pt's wife showed picture of wound on dorsum of foot that is being managed by PCP.   Prosthetic description: silicon liner with pin lock suspension, total contact socket, dynamic response foot.      TODAY'S TREATMENT:  08/30/2022 Prosthetic Training with right TTA prosthesis & left dynamic AFO and custom shoe with toe filler and carbon plate: Prosthetist's changes for AFO & prosthesis improved standing & gait stability. PT cues for standing balance to look forward as staring at floor pulls his weight anteriorly.  Pt able to stand statically for 30 sec on 3rd attempt.  Pt able to stand / sit from 18" chair without armrests using UEs to rollator walker.  PT demo & verbal cues on sit to/from stand from rolling desk chair. Pt return demo with supervision.  PT demo & verbal cues on moving chair under table.  Pt return demo understanding.  PT recommended not sitting in w/c in home to facilitate more mobility. PT recommended walking in/out of home & community with RW or rollator to facilitate independence for community mobility.  Now that he has rollator walker with seat that allows rest as needed, PT recommended 1-2x/day walking max tolerable level to increase endurance for longer activities. Pt verbalized understanding.    08/23/2022 Neuro Re-ed: In // bars Rocker board 1 min Ant-midline-Posterior, 1 min lateral both with BUE support 4" step toe taps with BUE back of hands touching bars Single UE blue banded rows, shoulder extension, and curls x 10 ea. with single UE support   Prosthetic Training with right TTA prosthesis & left dynamic AFO and custom shoe with toe filler and carbon plate: PT educated on short, medium, long distance walks for increasing activity and endurance and pt verbalized understanding PT discussed driving practice in empty parking lot with RLE or BLEs, with cruise control, or with hand  controls. pt and wife verbalized understanding Pt amb 43' with rollator and SBA, frequent instances of Rt toe drag  08/22/2022 Neuro Re-ed: In bars Rocker board 1 min Ant-Posterior, 1 min lateral Balance on foam beam X 2 min Ambulated without UE support in bars and used the bars PRN for balance 3 round trips  Prosthetic Training with right TTA prosthesis & left dynamic AFO and custom shoe with toe filler and carbon plate: Pt amb 40' x 2 around obstacles with rollator and SBA Pt ascend and descend ramp and curb with rollator and wife CGA to SBA Ambulated in bars with Rt UE support only 2 round trips, and left UE support only 2 round trips Ambulated backwards in bars 3 round trips.  HOME EXERCISE PROGRAM  Access Code: [email or web address] URL: [email or web address] Date: 07/05/2022 Prepared by: Jamey Reas  Exercises - Seated Correct Posture  - 1 x daily - 7 x weekly - Seated Scapular Retraction  - 2-5 x daily - 7 x weekly - 1 sets - 10 reps - 5 seconds hold - Supine Chin Tucks on Flat Ball  - 2-5 x daily - 7 x weekly - 1 sets - 10 reps - 5 seconds hold - Seated Isometric Cervical Retraction with Chin Tucks with Pillow behind head  - 2-5 x daily - 7 x weekly - 1 sets - 10 reps - 5 seconds hold - Circular Shoulder Pendulum with Table Support  - 1-2 x daily - 7 x weekly - 1-2 sets - 10 reps - 5 seconds hold - Flexion-Extension Shoulder Pendulum with Table Support  - 1-2 x daily - 7 x weekly - 1-2 sets - 10 reps - Horizontal Shoulder Pendulum with Table Support  - 1-2 x daily - 7 x weekly - 1-2 sets - 10 reps - Supine Shoulder Flexion Extension AAROM with Dowel  - 1 x daily - 7 x weekly - 1-2 sets - 10 reps - 5 seconds hold - Supine Shoulder Abduction AAROM with Dowel  - 1 x daily - 7 x weekly - 1-2 sets - 10 reps - 5 seconds hold - Supine Shoulder External Rotation with Dowel  - 1  x daily - 7 x weekly - 1-2 sets - 10 reps - 5 seconds hold   Neuromuscular Reed-  cues on  proprioception RLE using residual limb, balance & standing tolerance.   Do each exercise 1-2  times per day Do each exercise 5-10 repetitions Hold each exercise for 2 seconds to feel your location  AT [place].  Try to find this position when standing still for activities.   USE TAPE ON FLOOR TO MARK THE MIDLINE POSITION which is even with middle of sink.  You also should try to feel with your limb pressure in socket.  You are trying to feel with limb what you used to feel with the bottom of your foot.  Side to Side Shift: Moving your hips only (not shoulders): move weight onto your left leg, HOLD/FEEL pressure in socket.  Move back to equal weight on each leg, HOLD/FEEL pressure in socket. Move weight onto your right leg, HOLD/FEEL pressure in socket. Move back to equal weight on each leg, HOLD/FEEL pressure in socket. Repeat.  Start with both hands on sink, progress to hand on prosthetic side only, then no hands.  Front to Back Shift: Moving your hips only (not shoulders): move your weight forward onto your toes, HOLD/FEEL pressure in socket. Move your weight back to equal Flat Foot on both legs, HOLD/FEEL  pressure in socket. Move your weight back onto your heels, HOLD/FEEL  pressure in socket. Move your weight back to equal on both legs, HOLD/FEEL  pressure in socket. Repeat.  Start with both hands on sink, progress to hand on prosthetic side only, then no hands.  Moving Cones / Cups: With equal weight on each leg: Hold on with one hand the first time, then progress to no hand supports. Move cups from one side of sink to the other. Place cups ~2" out of your reach, progress to 10" beyond reach.  Place one hand in middle of sink and reach with other hand. Do both arms.  Then hover one hand and move cups with other hand.  Overhead/Upward Reaching: alternated reaching up to top cabinets or ceiling if no cabinets present. Keep equal  weight on each leg. Start with one hand support on counter while other hand reaches and progress to no hand support with reaching.  ace one hand in middle of sink and reach with other hand. Do both arms.  Then hover one hand and move cups with other hand.  5.   Looking Over Shoulders: With equal weight on each leg: alternate turning to look over your shoulders with one hand support on counter as needed.  Start with head motions only to look in front of shoulder, then even with shoulder and progress to looking behind you. To look to side, move head /eyes, then shoulder on side looking pulls back, shift more weight to side looking and pull hip back. Place one hand in middle of sink and let go with other hand so your shoulder can pull back. Switch hands to look other way.   Then hover one hand and look over shoulder. If looking right, use left hand at sink. If looking left, use right hand at sink. 6.  Alternate Tapping Toes or Stepping to lower shelf of cabinet  Move items under cabinet out of your way. Shift your hips/pelvis so weight on stance leg & Tighten muscles in hip.  SLOWLY step other leg so front of foot  is in cabinet. Then step back to floor. Repeat with other leg.     ASSESSMENT:   CLINICAL IMPRESSION: Today PT focused on improving his overall function & independence at basic community level.  Pt improved sit to/from stand from multiple chair situations and verbalizes understanding of PT recommendations to progress distances with gait.   OBJECTIVE IMPAIRMENTS .Abnormal gait, cardiopulmonary status limiting activity, decreased activity tolerance, decreased balance, decreased endurance, decreased knowledge of use of DME, decreased mobility, decreased ROM, decreased strength, dizziness, increased edema, impaired flexibility, postural dysfunction, prosthetic dependency , obesity, and pain.    ACTIVITY LIMITATIONS carrying, lifting, bending, standing, stairs, transfers, locomotion level, UE use with  ADLs, sleep    PARTICIPATION LIMITATIONS: driving, community activity, and yard work, UE use with ADLs &  household activities   [place], Time since onset of injury/illness/exacerbation, and 3+ comorbidities: see PMH  are also affecting patient's functional outcome.    REHAB POTENTIAL: Good   CLINICAL DECISION MAKING: Evolving/moderate complexity   EVALUATION COMPLEXITY: Moderate     GOALS: Goals reviewed with patient? Yes  SHORT TERM GOALS: Target date: 09/01/2022  1. Patient able to stand with no UE support for 30 seconds with supervision. Baseline: SEE OBJECTIVE DATA Goal status: MET 08/30/2022   2. Patient able to stand & sit from 18" without armrests using his UEs to RW safely Baseline: SEE OBJECTIVE DATA Goal status: MET 08/30/2022  3. Patient ambulates up to 200' with RW, prosthesis & AFO with supervision.  Goal status: ongoing 08/30/2022  4. Patient is able to negotiate a ramp & curb with RW with wife's supervision to enable basic community access without w/c.  Goal status: ongoing 08/30/2022     UPDATED LONG TERM GOALS: Target date: 10/26/2022   Patient demonstrates & verbalized understanding of prosthetic care to enable safe utilization of prosthesis. Baseline: SEE OBJECTIVE DATA Goal status: ongoing 08/03/22    Patient tolerates right prosthesis & left AFO / partial foot prosthesis wear >90% of awake hours without skin or limb pain issues. Baseline: SEE OBJECTIVE DATA Goal status: ongoing 08/03/22    Berg Balance >/= 20/56 and tasks of Berg with RW support >/= 52/56 to indicate lower fall risk with standing ADLs Baseline: SEE OBJECTIVE DATA Goal status: 08/03/2022 updated    Patient ambulates >300' with prosthesis & LRAD modified independent Baseline: SEE OBJECTIVE DATA Goal status: ongoing 08/03/2022   Patient negotiates ramps, curbs & stairs with single rail with prosthesis & LRAD modified independent. Baseline: SEE OBJECTIVE DATA Goal status:  ongoing 08/03/22  PLAN: PT FREQUENCY: 2x/week   PT DURATION: 12 weeks  PLANNED INTERVENTIONS: Therapeutic exercises, Therapeutic activity, Neuromuscular re-education, Balance training, Gait training, Patient/Family education, Self Care, Stair training, Vestibular training, Canalith repositioning, Prosthetic training, DME instructions, Re-evaluation, and physical performance testing    PLAN FOR NEXT SESSION:  Assess remaining STGs. Continue to progress standing balance and prosthetic gait with rollator   Jamey Reas, PT, DPT 08/30/2022, 12:54 PM

## 2022-09-01 ENCOUNTER — Encounter: Payer: Self-pay | Admitting: Physical Therapy

## 2022-09-01 ENCOUNTER — Ambulatory Visit (INDEPENDENT_AMBULATORY_CARE_PROVIDER_SITE_OTHER): Payer: Medicare Other | Admitting: Physical Therapy

## 2022-09-01 DIAGNOSIS — R2689 Other abnormalities of gait and mobility: Secondary | ICD-10-CM

## 2022-09-01 DIAGNOSIS — M6281 Muscle weakness (generalized): Secondary | ICD-10-CM

## 2022-09-01 DIAGNOSIS — R2681 Unsteadiness on feet: Secondary | ICD-10-CM

## 2022-09-01 DIAGNOSIS — R293 Abnormal posture: Secondary | ICD-10-CM | POA: Diagnosis not present

## 2022-09-01 NOTE — Therapy (Signed)
OUTPATIENT PHYSICAL THERAPY TREATMENT NOTE   Patient Name: Shane [name] MRN: [id number] DOB:1941/03/06, 81 y.o., male Today's Date: 09/01/2022  PCP: Burnard Bunting, MD REFERRING PROVIDER: [name], MD    END OF SESSION:   PT End of Session - 09/01/22 0804     Visit Number 31    Number of Visits 18    Date for PT Re-Evaluation 10/26/22    Authorization Type UHC Medicare    Authorization Time Period UHC MEDICARE  $20 COPAY    Progress Note Due on Visit 40    PT Start Time 0800    PT Stop Time 0842    PT Time Calculation (min) 42 min    Equipment Utilized During Treatment Gait belt    Activity Tolerance Patient tolerated treatment well;Patient limited by fatigue    Behavior During Therapy WFL for tasks assessed/performed               Past Medical History:  Diagnosis Date   Anxiety    AV block, Mobitz 1    Cataract    Chronic kidney disease    d/t DM   CKD (chronic kidney disease), stage III ([place])    Depression    Diabetes mellitus    Vgo disposal insulin bolus  simular to insulin pump   Dyspnea    GERD (gastroesophageal reflux disease)    History of kidney stones    passed   Hyperlipidemia    Hypertension    Idiopathic pulmonary fibrosis ([place]) 11/2016   ILD (interstitial lung disease) ([place])    Moderate aortic stenosis    a. 10/2019 Echo: EF 55-60%, Gr2 DD. Nl RV.    Neuromuscular disorder ([place])    Neuropathy associated with endocrine disorder ([place])    Nonobstructive CAD (coronary artery disease)    a. 2012 Cath: mod, nonobs dzs; b. 10/2016 MV: EF 60%, no ischemia.   OSA on CPAP 05/05/2017   Unattended Home Sleep Test 7/2/813-AHI 38.6/hour, desaturation to 64%, body weight 261 pounds   PONV (postoperative nausea and vomiting)    Sleep apnea     uses cpap asked to bring mask and tubing   Past Surgical History:  Procedure Laterality Date   ABDOMINAL AORTOGRAM W/LOWER EXTREMITY N/A 12/10/2020   Procedure: ABDOMINAL AORTOGRAM W/LOWER EXTREMITY;   Surgeon: Cherre Robins, MD;  Location: [facility];  Service: Cardiovascular;  Laterality: N/A;   AMPUTATION Right 01/22/2021   Procedure: 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;  Surgeon: [name], MD;  Location: [facility];  Service: Orthopedics;  Laterality: Right;   AMPUTATION Right 03/17/2021   Procedure: RIGHT BELOW KNEE AMPUTATION;  Surgeon: [name], MD;  Location: [facility];  Service: Orthopedics;  Laterality: Right;   ANKLE FUSION Right 01/22/2021   Procedure: RIGHT FOOT TIBIOCALCANEAL FUSION;  Surgeon: [name], MD;  Location: [facility];  Service: Orthopedics;  Laterality: Right;   ANTERIOR FUSION CERVICAL SPINE  2012   CARDIAC CATHETERIZATION  2011   CARDIAC CATHETERIZATION N/A 11/09/2016   Procedure: Right Heart Cath;  Surgeon: Belva Crome, MD;  Location: [facility];  Service: Cardiovascular;  Laterality: N/A;   carpel tunnel     left wrist   CATARACT EXTRACTION     CATARACT EXTRACTION W/ INTRAOCULAR LENS  IMPLANT, BILATERAL  2013   CERVICAL LAMINECTOMY  2012   COLONOSCOPY N/A 01/14/2013   Procedure: COLONOSCOPY;  Surgeon: Irene Shipper, MD;  Location: [facility];  Service: Endoscopy;  Laterality:  N/A;   CORONARY ARTERY BYPASS GRAFT  11/04/2020   LIMA-LAD, SVG-OM1, SVG-PDA (Dr Marney [facility]) dc 11/18/2020   EYE SURGERY     I & D EXTREMITY Right 02/19/2021   Procedure: RIGHT ANKLE DEBRIDEMENT AND PLACEMENT ANTIBIOTIC BEADS;  Surgeon: [name], MD;  Location: [facility];  Service: Orthopedics;  Laterality: Right;   KNEE SURGERY  1998   left   LEFT HEART CATH AND CORONARY ANGIOGRAPHY N/A 07/10/2020   Procedure: LEFT HEART CATH AND CORONARY ANGIOGRAPHY;  Surgeon: Sherren Mocha, MD;  Location: [facility];  Service: Cardiovascular;  Laterality: N/A;   LUMBAR LAMINECTOMY  2003   LUNG BIOPSY Left 12/26/2016   Procedure: LUNG BIOPSY;  Surgeon: [name], MD;  Location: [facility];  Service: Thoracic;  Laterality: Left;   PACEMAKER IMPLANT N/A 03/30/2020    Procedure: PACEMAKER IMPLANT;  Surgeon: Evans Lance, MD;  Location: [facility];  Service: Cardiovascular;  Laterality: N/A;   PERIPHERAL VASCULAR INTERVENTION Right 12/10/2020   Procedure: PERIPHERAL VASCULAR INTERVENTION;  Surgeon: Cherre Robins, MD;  Location: [facility];  Service: Cardiovascular;  Laterality: Right;  SFA   POSTERIOR FUSION CERVICAL SPINE  2012   PPM GENERATOR REMOVAL N/A 12/14/2020   Procedure: PPM GENERATOR REMOVAL;  Surgeon: Evans Lance, MD;  Location: [facility];  Service: Cardiovascular;  Laterality: N/A;   TEE WITHOUT CARDIOVERSION N/A 12/11/2020   Procedure: TRANSESOPHAGEAL ECHOCARDIOGRAM (TEE);  Surgeon: Geralynn Rile, MD;  Location: [facility];  Service: Cardiovascular;  Laterality: N/A;   TRIGGER FINGER RELEASE  2011   4th finger left hand   VIDEO ASSISTED THORACOSCOPY Left 12/26/2016   Procedure: VIDEO ASSISTED THORACOSCOPY;  Surgeon: [name], MD;  Location: [facility];  Service: Thoracic;  Laterality: Left;   VIDEO BRONCHOSCOPY N/A 12/26/2016   Procedure: VIDEO BRONCHOSCOPY;  Surgeon: [name], MD;  Location: [facility];  Service: Thoracic;  Laterality: N/A;   Patient Active Problem List   Diagnosis Date Noted   Intermediate stage nonexudative age-related macular degeneration of both eyes 04/25/2022   Research study patient 12/24/2021   Post-operative pain    CKD (chronic kidney disease), stage II    Labile blood glucose    Pulmonary fibrosis ([place])    Hyponatremia    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[place]) 03/26/2021   S/P BKA (below knee amputation) unilateral, right ([place])    Shortness of breath    Acute blood loss anemia    Stage 3 chronic kidney disease ([place])    Atrial fibrillation ([place])    Controlled type 2 diabetes mellitus with hyperglycemia, with long-term current use of insulin ([facility])    Postoperative pain    Postoperative hemorrhagic shock 03/20/2021   Atherosclerosis of native arteries of extremities  with gangrene, right leg ([place])    Hardware complicating wound infection ([place])    Abscess of bursa of right ankle 02/15/2021   Endocarditis 01/19/2021   Peripheral vascular disease ([place]) 12/29/2020   CHF (congestive heart failure), NYHA class II, chronic, diastolic ([place]) 62/83/1517   History of COVID-19 12/22/2020   SSS (sick sinus syndrome) ([place]) 12/22/2020   Acute bacterial endocarditis    MSSA bacteremia 12/09/2020   Diabetic foot ulcer ([place]) 12/09/2020   Foot drop, right foot    Generalized weakness 12/08/2020   Dehydration with hyponatremia 12/08/2020   Atrial fibrillation, chronic ([place]) 12/08/2020   Elevated troponin level not due myocardial infarction 12/08/2020   Adrenal insufficiency ([place]) 11/14/2020  Orthostatic hypotension 11/14/2020   Physical deconditioning 11/14/2020   Difficulty sleeping 11/07/2020   Postoperative anemia due to acute blood loss 11/07/2020   Right ventricular dysfunction 11/05/2020   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/04/2020   Hearing loss 11/03/2020   Cardiac device in situ 09/09/2020   Coronary atherosclerosis due to lipid rich plaque 09/02/2020   COVID-19 virus infection 07/18/2020   Coronary artery disease involving native heart without angina pectoris 07/10/2020   Chronic kidney disease, stage 3a ([place]) 03/29/2020   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/28/2020   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 ([place]) 03/16/2020   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 ([place]) 03/16/2020   Right epiretinal membrane 03/16/2020   Posterior vitreous detachment of right eye 03/16/2020   Aortic stenosis, moderate 03/12/2020   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   Near syncope 04/27/2018   Hypoglycemia due to insulin 01/06/2018   Essential hypertension 01/06/2018   Diabetes mellitus type 2, with complication, on long term insulin pump ([place]) 01/06/2018   Syncope and  collapse 01/05/2018   Encounter for therapeutic drug monitoring 06/29/2017   OSA on CPAP 05/05/2017   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[place]) 01/05/2017   Abnormal chest x-ray 10/12/2016   Benign neoplasm of colon 01/14/2013    REFERRING DIAG: Z89.511 (ICD-10-CM) - Hx of right BKA   ONSET DATE: 05/04/2022 MD visit with release to [facility] on LLE  THERAPY DIAG:  Unsteadiness on feet  Other abnormalities of gait and mobility  Muscle weakness (generalized)  Abnormal posture  Rationale for Evaluation and Treatment Rehabilitation  PERTINENT HISTORY: CKD st III, AV block Mobitz, pacemaker, pulmonary fibrosis, CAD, CABG 21, HTN, A-Fib, DM2, PAD, right BKA, Left TMA  PRECAUTIONS: ICD/Pacemaker - continue monitoring vitals with activity and calculated HRmax is 151 bpm  SUBJECTIVE:  He did not sit in w/c in home which encouraged more frequent walks.  The rollator with big wheels helps a lot.    PAIN:    Are you having pain? Yes: NPRS scale: today  0/10, over last week lowest 0/10  Pain location: left shoulder lateral just distal to joint Pain description: tingling when shoulder in improper posture. Aggravating factors: improper posture. Relieving factors: correcting posture   OBJECTIVE: (objective measures completed at initial evaluation unless otherwise dated)  COGNITION: Overall cognitive status: Within functional limits for tasks assessed   POSTURE: 05/09/2022:  rounded shoulders, forward head, and flexed trunk    LOWER EXTREMITY ROM:    UPPER EXTREMITY ROM:  ROM P:passive  A:active Left 06/23/22 Supine all limited by pain Left 08/01/22 Seated All painfree Right 08/01/22 Seated  Shoulder flexion 30* A: 148 A: 153  Shoulder extension 0* neutral A: 72 A: 74  Shoulder abduction 45* A: 135 A: 141  Shoulder adduction     Shoulder internal rotation 20* A: 84 A: 89  Shoulder external rotation 10* A: 88 A: 83  Elbow flexion     Elbow extension -15*     (Blank rows = not  tested)    ROM P:passive  A:active Right Eval 05/09/2022 Left Eval 05/09/2022 Bilateral 08/03/22  Hip flexion       Hip extension A: -25* standing A: -25* standing A: -15*  RW support standing  Hip abduction       Hip adduction       Hip internal rotation       Hip external rotation  Knee flexion       Knee extension       Ankle dorsiflexion NA P: -17*   Ankle plantarflexion NA     Ankle inversion NA     Ankle eversion NA      (Blank rows = not tested)   LOWER EXTREMITY MMT:   MMT Right Eval 05/09/2022 Left Eval 05/09/2022  Hip flexion      Hip extension Functional testing 3-/5 Functional testing 3-/5  Hip abduction Functional testing 3-/5 Functional testing 3-/5  Hip adduction      Hip internal rotation      Hip external rotation      Knee flexion Functional testing 3-/5 Functional testing 3-/5  Knee extension Functional testing 3/5 Functional testing 3/5  Ankle dorsiflexion      Ankle plantarflexion      Ankle inversion      Ankle eversion      (Blank rows = not tested)   POSTURE:  06/23/2022  seated - head forward, rounded shoulders and posterior pelvic tilt  TRANSFERS: 05/09/2022:  Sit to stand: SBA and requires use of armrests from 20" stable w/c & needs RW for stabilization 05/09/2022:  Stand to sit: SBA and requires use of armrests from 20" stable w/c & needs RW for stabilization   GAIT: 08/11/2022: Pt amb 100' with rollator, right TTA prosthesis & left AFO partial shoe filler with SBA. He demonstrates increased step length bil., intermittent impaired foot clearance on RLE  08/03/2022: 2-minute Walk Test with RW, right TTA prosthesis & left AFO partial shoe filler. Resting pre HR 70 SpO2 100% HR after activity HR 52  SpO2 100% light amount of light headness Distance 158' with supervision.   Gait pattern: step-to pattern, but distance improved since evaluation, Right hip hike, trunk flexed, and normal 4" BOS  Pt neg ramp & curb with RW, ight TTA prosthesis &  left AFO partial shoe filler with supervision. He required seated rest half way due to fatigue.    05/09/2022:  Gait pattern: step to pattern, decreased step length- Left, decreased stance time- Right, Right hip hike, antalgic, lateral hip instability, trunk flexed, and wide BOS Distance walked: 25' Assistive device utilized: Environmental consultant - 2 wheeled and TTA prosthesis RLE Level of assistance: Min A Comments: pt reports dizziness with turning in gait   UE FUNCTION: 06/23/2022:  Pt reports that he uses RUE for fine motor like writing but uses both hands for gross motor or sports.  Seated - pt unable to raise LUE in flexion or abd due to pain.  Supine - pt unable to move LUE in flexion or abd due to pain.  PROM flexion > pain than abd   Pt tolerates elbow ext with palm supinated but with palm neutral causes shoulder pain. Also painful with elbow already extended and pronate palm.   PT simulated pushing on RW thru LUE with manual resistance.  Pt reports slight increase in pain but not as sharp as above function.    FUNCTIONAL TESTs:  Merrilee Jansky Balance Scale: 08/03/2022:  13/56 & tasks of Berg with RW support 45/56     Berg Balance Scale: 05/09/2022:  9/56 & tasks of Berg with RW support 27/56    [facility] PT Assessment - 05/09/22 0930                Berg Balance Test    Sit to Stand Needs moderate or maximal assist to stand   task with RW support = 3  Standing Unsupported Unable to stand 30 seconds unassisted   task with RW support = 3    Sitting with Back Unsupported but Feet Supported on Floor or Stool Able to sit safely and securely 2 minutes     Stand to Sit Uses backs of legs against chair to control descent   task with RW support = 3    Transfers Able to transfer safely, definite need of hands     Standing Unsupported with Eyes Closed Needs help to keep from falling   task with RW support = 3    Standing Unsupported with Feet Together Needs help to attain position and unable to hold for 15 seconds    task with RW support = 2    From Standing, Reach Forward with Outstretched Arm Loses balance while trying/requires external support   task with RW support = 1    From Standing Position, Pick up Object from Floor Unable to try/needs assist to keep balance   task with RW support = 2    From Standing Position, Turn to Look Behind Over each Shoulder Needs assist to keep from losing balance and falling   task with RW support = 1    Turn 360 Degrees Needs assistance while turning   task with RW support = 0    Standing Unsupported, Alternately Place Feet on Step/Stool Needs assistance to keep from falling or unable to try   task with RW support = 0    Standing Unsupported, One Foot in [employer] balance while stepping or standing   task with RW support = 1    Standing on One Leg Unable to try or needs assist to prevent fall   task with RW support = 1    Total Score 9     Berg comment: tasks of Berg with RW support 27/56                     CURRENT PROSTHETIC WEAR ASSESSMENT: 08/03/2022: Pt wears both right TTA prosthesis & left AFO partial shoe filler>90% of awake hours.  He has 2 wounds on dorsum of left foot being monitored by PCP.  He is wearing left Vive Wear compression sock below knee on LLE which has helped wounds to heal. Pt is independent in donning & doffing both prosthesis & AFO. Skin check, cleaning and minor cues on adjusting ply socks.   05/09/2022:   Patient is dependent with: skin check, residual limb care, care of non-amputated limb, prosthetic cleaning, ply sock cleaning, and correct ply sock adjustment Donning prosthesis: Modified independence Doffing prosthesis: Modified independence Prosthetic wear tolerance: >90% of awake hours/day, 7 days/week Prosthetic weight bearing tolerance: 5 minutes Edema: LLE pitting edema with >10 sec capillary refill Residual limb condition: Right BKA limb has no openings, normal temperature & moisture.  Redness patella & patella tendon from  friction of liner with knee flexed. Cylinderical shape.  LLE TMA pt's wife showed picture of wound on dorsum of foot that is being managed by PCP.   Prosthetic description: silicon liner with pin lock suspension, total contact socket, dynamic response foot.      TODAY'S TREATMENT:  09/01/2022 Prosthetic Training with right TTA prosthesis & left dynamic AFO and custom shoe with toe filler and carbon plate: Resting SpO2 98% HR 72  Pt ambulated 155' in 2 min with RW as max tolerable distance.  He reports congestion with sinus issue limiting breathing & distance.  Post gait SpO2 93% HR 88  pt verbally instructed in "Talk-Sing" test for guidance on gait. Pt verbalized understanding. Stairs with 1 rail & cane 4 steps (2 ea rail) with supervision. PT demo & verbal cues.  PT instructed in recommendations with [place] for [place]up.  Pt & wife verbalized understanding.  Pt neg ramp & curb with RW with supervision & no LOB.    08/30/2022 Prosthetic Training with right TTA prosthesis & left dynamic AFO and custom shoe with toe filler and carbon plate: Prosthetist's changes for AFO & prosthesis improved standing & gait stability. PT cues for standing balance to look forward as staring at floor pulls his weight anteriorly.  Pt able to stand statically for 30 sec on 3rd attempt.  Pt able to stand / sit from 18" chair without armrests using UEs to rollator walker.  PT demo & verbal cues on sit to/from stand from rolling desk chair. Pt return demo with supervision.  PT demo & verbal cues on moving chair under table.  Pt return demo understanding.  PT recommended not sitting in w/c in home to facilitate more mobility. PT recommended walking in/out of home & community with RW or rollator to facilitate independence for community mobility.  Now that he has rollator walker with seat that allows rest as needed, PT recommended 1-2x/day walking max tolerable level to increase endurance for longer  activities. Pt verbalized understanding.    08/23/2022 Neuro Re-ed: In // bars Rocker board 1 min Ant-midline-Posterior, 1 min lateral both with BUE support 4" step toe taps with BUE back of hands touching bars Single UE blue banded rows, shoulder extension, and curls x 10 ea. with single UE support   Prosthetic Training with right TTA prosthesis & left dynamic AFO and custom shoe with toe filler and carbon plate: PT educated on short, medium, long distance walks for increasing activity and endurance and pt verbalized understanding PT discussed driving practice in empty parking lot with RLE or BLEs, with cruise control, or with hand controls. pt and wife verbalized understanding Pt amb 32' with rollator and SBA, frequent instances of Rt toe drag   HOME EXERCISE PROGRAM  Access Code: [email or web address] URL: [email or web address] Date: 07/05/2022 Prepared by: Jamey Reas  Exercises - Seated Correct Posture  - 1 x daily - 7 x weekly - Seated Scapular Retraction  - 2-5 x daily - 7 x weekly - 1 sets - 10 reps - 5 seconds hold - Supine Chin Tucks on Flat Ball  - 2-5 x daily - 7 x weekly - 1 sets - 10 reps - 5 seconds hold - Seated Isometric Cervical Retraction with Chin Tucks with Pillow behind head  - 2-5 x daily - 7 x weekly - 1 sets - 10 reps - 5 seconds hold - Circular Shoulder Pendulum with Table Support  - 1-2 x daily - 7 x weekly - 1-2 sets - 10 reps - 5 seconds hold - Flexion-Extension Shoulder Pendulum with Table Support  - 1-2 x daily - 7 x weekly - 1-2 sets - 10 reps - Horizontal Shoulder Pendulum with Table Support  - 1-2 x daily - 7 x weekly - 1-2 sets - 10 reps - Supine Shoulder Flexion Extension AAROM with Dowel  - 1 x daily - 7 x weekly - 1-2 sets - 10 reps - 5 seconds hold - Supine Shoulder Abduction AAROM with Dowel  - 1 x daily - 7 x weekly - 1-2 sets - 10 reps - 5 seconds hold - Supine Shoulder  External Rotation with Dowel  - 1 x daily - 7 x weekly - 1-2 sets - 10  reps - 5 seconds hold   Neuromuscular Reed-  cues on proprioception RLE using residual limb, balance & standing tolerance.   Do each exercise 1-2  times per day Do each exercise 5-10 repetitions Hold each exercise for 2 seconds to feel your location  AT [place].  Try to find this position when standing still for activities.   USE TAPE ON FLOOR TO MARK THE MIDLINE POSITION which is even with middle of sink.  You also should try to feel with your limb pressure in socket.  You are trying to feel with limb what you used to feel with the bottom of your foot.  Side to Side Shift: Moving your hips only (not shoulders): move weight onto your left leg, HOLD/FEEL pressure in socket.  Move back to equal weight on each leg, HOLD/FEEL pressure in socket. Move weight onto your right leg, HOLD/FEEL pressure in socket. Move back to equal weight on each leg, HOLD/FEEL pressure in socket. Repeat.  Start with both hands on sink, progress to hand on prosthetic side only, then no hands.  Front to Back Shift: Moving your hips only (not shoulders): move your weight forward onto your toes, HOLD/FEEL pressure in socket. Move your weight back to equal Flat Foot on both legs, HOLD/FEEL  pressure in socket. Move your weight back onto your heels, HOLD/FEEL  pressure in socket. Move your weight back to equal on both legs, HOLD/FEEL  pressure in socket. Repeat.  Start with both hands on sink, progress to hand on prosthetic side only, then no hands.  Moving Cones / Cups: With equal weight on each leg: Hold on with one hand the first time, then progress to no hand supports. Move cups from one side of sink to the other. Place cups ~2" out of your reach, progress to 10" beyond reach.  Place one hand in middle of sink and reach with other hand. Do both arms.  Then hover one hand and move cups with other hand.  Overhead/Upward Reaching: alternated reaching up to top  cabinets or ceiling if no cabinets present. Keep equal weight on each leg. Start with one hand support on counter while other hand reaches and progress to no hand support with reaching.  ace one hand in middle of sink and reach with other hand. Do both arms.  Then hover one hand and move cups with other hand.  5.   Looking Over Shoulders: With equal weight on each leg: alternate turning to look over your shoulders with one hand support on counter as needed.  Start with head motions only to look in front of shoulder, then even with shoulder and progress to looking behind you. To look to side, move head /eyes, then shoulder on side looking pulls back, shift more weight to side looking and pull hip back. Place one hand in middle of sink and let go with other hand so your shoulder can pull back. Switch hands to look other way.   Then hover one hand and look over shoulder. If looking right, use left hand at sink. If looking left, use right hand at sink. 6.  Alternate Tapping Toes or Stepping to lower shelf of cabinet  Move items under cabinet out of your way. Shift your hips/pelvis so weight on stance leg & Tighten muscles in hip.  SLOWLY  step other leg so front of foot is in cabinet. Then step back to floor. Repeat with other leg.     ASSESSMENT:   CLINICAL IMPRESSION: Patient appears safer with gait over last couple of weeks. He needs to work to improve endurance for fuller community mobility.  He continues to benefit from skilled PT.   OBJECTIVE IMPAIRMENTS .Abnormal gait, cardiopulmonary status limiting activity, decreased activity tolerance, decreased balance, decreased endurance, decreased knowledge of use of DME, decreased mobility, decreased ROM, decreased strength, dizziness, increased edema, impaired flexibility, postural dysfunction, prosthetic dependency , obesity, and pain.    ACTIVITY LIMITATIONS carrying, lifting, bending, standing, stairs, transfers, locomotion level, UE use with ADLs, sleep     PARTICIPATION LIMITATIONS: driving, community activity, and yard work, UE use with ADLs &  household activities   [place], Time since onset of injury/illness/exacerbation, and 3+ comorbidities: see PMH  are also affecting patient's functional outcome.    REHAB POTENTIAL: Good   CLINICAL DECISION MAKING: Evolving/moderate complexity   EVALUATION COMPLEXITY: Moderate     GOALS: Goals reviewed with patient? Yes  SHORT TERM GOALS: Target date: 09/01/2022  1. Patient able to stand with no UE support for 30 seconds with supervision. Baseline: SEE OBJECTIVE DATA Goal status: MET 08/30/2022   2. Patient able to stand & sit from 18" without armrests using his UEs to RW safely Baseline: SEE OBJECTIVE DATA Goal status: MET 08/30/2022  3. Patient ambulates up to 200' with RW, prosthesis & AFO with supervision.  Goal status: partially met 09/01/2022  4. Patient is able to negotiate a ramp & curb with RW with wife's supervision to enable basic community access without w/c.  Goal status: MET 09/01/2022     UPDATED LONG TERM GOALS: Target date: 10/26/2022   Patient demonstrates & verbalized understanding of prosthetic care to enable safe utilization of prosthesis. Baseline: SEE OBJECTIVE DATA Goal status: ongoing 08/03/22    Patient tolerates right prosthesis & left AFO / partial foot prosthesis wear >90% of awake hours without skin or limb pain issues. Baseline: SEE OBJECTIVE DATA Goal status: ongoing 08/03/22    Berg Balance >/= 20/56 and tasks of Berg with RW support >/= 52/56 to indicate lower fall risk with standing ADLs Baseline: SEE OBJECTIVE DATA Goal status: 08/03/2022 updated    Patient ambulates >300' with prosthesis & LRAD modified independent Baseline: SEE OBJECTIVE DATA Goal status: ongoing 08/03/2022   Patient negotiates ramps, curbs & stairs with single rail with prosthesis & LRAD modified independent. Baseline: SEE OBJECTIVE DATA Goal status: ongoing  08/03/22  PLAN: PT FREQUENCY: 2x/week   PT DURATION: 12 weeks  PLANNED INTERVENTIONS: Therapeutic exercises, Therapeutic activity, Neuromuscular re-education, Balance training, Gait training, Patient/Family education, Self Care, Stair training, Vestibular training, Canalith repositioning, Prosthetic training, DME instructions, Re-evaluation, and physical performance testing    PLAN FOR NEXT SESSION:  set updated STGs. Continue to progress standing balance and prosthetic gait with rollator   Jamey Reas, PT, DPT 09/01/2022, 11:48 AM

## 2022-09-02 ENCOUNTER — Ambulatory Visit: Payer: Medicare Other

## 2022-09-06 ENCOUNTER — Ambulatory Visit (INDEPENDENT_AMBULATORY_CARE_PROVIDER_SITE_OTHER): Payer: Medicare Other | Admitting: Physical Therapy

## 2022-09-06 ENCOUNTER — Encounter: Payer: Self-pay | Admitting: Physical Therapy

## 2022-09-06 DIAGNOSIS — R2681 Unsteadiness on feet: Secondary | ICD-10-CM

## 2022-09-06 DIAGNOSIS — R293 Abnormal posture: Secondary | ICD-10-CM

## 2022-09-06 DIAGNOSIS — R2689 Other abnormalities of gait and mobility: Secondary | ICD-10-CM | POA: Diagnosis not present

## 2022-09-06 DIAGNOSIS — M6281 Muscle weakness (generalized): Secondary | ICD-10-CM

## 2022-09-06 DIAGNOSIS — Z9181 History of falling: Secondary | ICD-10-CM

## 2022-09-06 NOTE — Therapy (Signed)
OUTPATIENT PHYSICAL THERAPY TREATMENT NOTE   Patient Name: Shane [name] MRN: [id number] DOB:July 28, 1941, 81 y.o., male Today's Date: 09/06/2022  PCP: Burnard Bunting, MD REFERRING PROVIDER: [name], MD    END OF SESSION:   PT End of Session - 09/06/22 0925     Visit Number 32    Number of Visits 66    Date for PT Re-Evaluation 10/26/22    Authorization Type UHC Medicare    Authorization Time Period [facility] MEDICARE  $20 COPAY    Progress Note Due on Visit 33    PT Start Time 0803    PT Stop Time 0845    PT Time Calculation (min) 42 min    Equipment Utilized During Treatment Gait belt    Activity Tolerance Patient tolerated treatment well;Patient limited by fatigue    Behavior During Therapy WFL for tasks assessed/performed               Past Medical History:  Diagnosis Date   Anxiety    AV block, Mobitz 1    Cataract    Chronic kidney disease    d/t DM   CKD (chronic kidney disease), stage III ([place])    Depression    Diabetes mellitus    Vgo disposal insulin bolus  simular to insulin pump   Dyspnea    GERD (gastroesophageal reflux disease)    History of kidney stones    passed   Hyperlipidemia    Hypertension    Idiopathic pulmonary fibrosis ([place]) 11/2016   ILD (interstitial lung disease) ([place])    Moderate aortic stenosis    a. 10/2019 Echo: EF 55-60%, Gr2 DD. Nl RV.    Neuromuscular disorder ([place])    Neuropathy associated with endocrine disorder ([place])    Nonobstructive CAD (coronary artery disease)    a. 2012 Cath: mod, nonobs dzs; b. 10/2016 MV: EF 60%, no ischemia.   OSA on CPAP 05/05/2017   Unattended Home Sleep Test 7/2/813-AHI 38.6/hour, desaturation to 64%, body weight 261 pounds   PONV (postoperative nausea and vomiting)    Sleep apnea     uses cpap asked to bring mask and tubing   Past Surgical History:  Procedure Laterality Date   ABDOMINAL AORTOGRAM W/LOWER EXTREMITY N/A 12/10/2020   Procedure: ABDOMINAL AORTOGRAM W/LOWER EXTREMITY;   Surgeon: Cherre Robins, MD;  Location: [place] [facility];  Service: Cardiovascular;  Laterality: N/A;   AMPUTATION Right 01/22/2021   Procedure: 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;  Surgeon: [name], MD;  Location: [place];  Service: Orthopedics;  Laterality: Right;   AMPUTATION Right 03/17/2021   Procedure: RIGHT BELOW KNEE AMPUTATION;  Surgeon: [name], MD;  Location: [place];  Service: Orthopedics;  Laterality: Right;   ANKLE FUSION Right 01/22/2021   Procedure: RIGHT FOOT TIBIOCALCANEAL FUSION;  Surgeon: [name], MD;  Location: [place];  Service: Orthopedics;  Laterality: Right;   ANTERIOR FUSION CERVICAL SPINE  2012   CARDIAC CATHETERIZATION  2011   CARDIAC CATHETERIZATION N/A 11/09/2016   Procedure: Right Heart Cath;  Surgeon: Belva Crome, MD;  Location: [place] [facility];  Service: Cardiovascular;  Laterality: N/A;   carpel tunnel     left wrist   CATARACT EXTRACTION     CATARACT EXTRACTION W/ INTRAOCULAR LENS  IMPLANT, BILATERAL  2013   CERVICAL LAMINECTOMY  2012   COLONOSCOPY N/A 01/14/2013   Procedure: COLONOSCOPY;  Surgeon: Irene Shipper, MD;  Location: [facility];  Service: Endoscopy;  Laterality:  N/A;   CORONARY ARTERY BYPASS GRAFT  11/04/2020   LIMA-LAD, SVG-OM1, SVG-PDA (Dr Marney [facility]) dc 11/18/2020   EYE SURGERY     I & D EXTREMITY Right 02/19/2021   Procedure: RIGHT ANKLE DEBRIDEMENT AND PLACEMENT ANTIBIOTIC BEADS;  Surgeon: [name], MD;  Location: [place];  Service: Orthopedics;  Laterality: Right;   KNEE SURGERY  1998   left   LEFT HEART CATH AND CORONARY ANGIOGRAPHY N/A 07/10/2020   Procedure: LEFT HEART CATH AND CORONARY ANGIOGRAPHY;  Surgeon: Sherren Mocha, MD;  Location: [place] [facility];  Service: Cardiovascular;  Laterality: N/A;   LUMBAR LAMINECTOMY  2003   LUNG BIOPSY Left 12/26/2016   Procedure: LUNG BIOPSY;  Surgeon: [name], MD;  Location: [place];  Service: Thoracic;  Laterality: Left;   PACEMAKER IMPLANT N/A 03/30/2020    Procedure: PACEMAKER IMPLANT;  Surgeon: Evans Lance, MD;  Location: [place] [facility];  Service: Cardiovascular;  Laterality: N/A;   PERIPHERAL VASCULAR INTERVENTION Right 12/10/2020   Procedure: PERIPHERAL VASCULAR INTERVENTION;  Surgeon: Cherre Robins, MD;  Location: [place] [facility];  Service: Cardiovascular;  Laterality: Right;  SFA   POSTERIOR FUSION CERVICAL SPINE  2012   PPM GENERATOR REMOVAL N/A 12/14/2020   Procedure: PPM GENERATOR REMOVAL;  Surgeon: Evans Lance, MD;  Location: [place] [facility];  Service: Cardiovascular;  Laterality: N/A;   TEE WITHOUT CARDIOVERSION N/A 12/11/2020   Procedure: TRANSESOPHAGEAL ECHOCARDIOGRAM (TEE);  Surgeon: Geralynn Rile, MD;  Location: [place];  Service: Cardiovascular;  Laterality: N/A;   TRIGGER FINGER RELEASE  2011   4th finger left hand   VIDEO ASSISTED THORACOSCOPY Left 12/26/2016   Procedure: VIDEO ASSISTED THORACOSCOPY;  Surgeon: [name], MD;  Location: [place];  Service: Thoracic;  Laterality: Left;   VIDEO BRONCHOSCOPY N/A 12/26/2016   Procedure: VIDEO BRONCHOSCOPY;  Surgeon: [name], MD;  Location: [place];  Service: Thoracic;  Laterality: N/A;   Patient Active Problem List   Diagnosis Date Noted   Intermediate stage nonexudative age-related macular degeneration of both eyes 04/25/2022   Research study patient 12/24/2021   Post-operative pain    CKD (chronic kidney disease), stage II    Labile blood glucose    Pulmonary fibrosis ([place])    Hyponatremia    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[place]) 03/26/2021   S/P BKA (below knee amputation) unilateral, right ([place])    Shortness of breath    Acute blood loss anemia    Stage 3 chronic kidney disease ([place])    Atrial fibrillation ([place])    Controlled type 2 diabetes mellitus with hyperglycemia, with long-term current use of insulin ([facility])    Postoperative pain    Postoperative hemorrhagic shock 03/20/2021   Atherosclerosis of native arteries of extremities  with gangrene, right leg ([place])    Hardware complicating wound infection ([place])    Abscess of bursa of right ankle 02/15/2021   Endocarditis 01/19/2021   Peripheral vascular disease ([place]) 12/29/2020   CHF (congestive heart failure), NYHA class II, chronic, diastolic ([place]) 62/83/1517   History of COVID-19 12/22/2020   SSS (sick sinus syndrome) ([place]) 12/22/2020   Acute bacterial endocarditis    MSSA bacteremia 12/09/2020   Diabetic foot ulcer ([place]) 12/09/2020   Foot drop, right foot    Generalized weakness 12/08/2020   Dehydration with hyponatremia 12/08/2020   Atrial fibrillation, chronic ([place]) 12/08/2020   Elevated troponin level not due myocardial infarction 12/08/2020   Adrenal insufficiency ([place]) 11/14/2020  Orthostatic hypotension 11/14/2020   Physical deconditioning 11/14/2020   Difficulty sleeping 11/07/2020   Postoperative anemia due to acute blood loss 11/07/2020   Right ventricular dysfunction 11/05/2020   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/04/2020   Hearing loss 11/03/2020   Cardiac device in situ 09/09/2020   Coronary atherosclerosis due to lipid rich plaque 09/02/2020   COVID-19 virus infection 07/18/2020   Coronary artery disease involving native heart without angina pectoris 07/10/2020   Chronic kidney disease, stage 3a ([place]) 03/29/2020   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/28/2020   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 ([place]) 03/16/2020   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 ([place]) 03/16/2020   Right epiretinal membrane 03/16/2020   Posterior vitreous detachment of right eye 03/16/2020   Aortic stenosis, moderate 03/12/2020   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   Near syncope 04/27/2018   Hypoglycemia due to insulin 01/06/2018   Essential hypertension 01/06/2018   Diabetes mellitus type 2, with complication, on long term insulin pump ([place]) 01/06/2018   Syncope and  collapse 01/05/2018   Encounter for therapeutic drug monitoring 06/29/2017   OSA on CPAP 05/05/2017   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[place]) 01/05/2017   Abnormal chest x-ray 10/12/2016   Benign neoplasm of colon 01/14/2013    REFERRING DIAG: Z89.511 (ICD-10-CM) - Hx of right BKA   ONSET DATE: 05/04/2022 MD visit with release to WB on LLE  THERAPY DIAG:  Unsteadiness on feet  Other abnormalities of gait and mobility  Muscle weakness (generalized)  Abnormal posture  History of falling  Rationale for Evaluation and Treatment Rehabilitation  PERTINENT HISTORY: CKD st III, AV block Mobitz, pacemaker, pulmonary fibrosis, CAD, CABG 21, HTN, A-Fib, DM2, PAD, right BKA, Left TMA  PRECAUTIONS: ICD/Pacemaker - continue monitoring vitals with activity and calculated HRmax is 151 bpm  SUBJECTIVE:  He relays SHOB and low energy levels due to flare up in pulmonary fibrosis. He denies pain today or any issues with prosthesis  PAIN:    Are you having pain? Yes: NPRS scale: today  0/10, over last week lowest 0/10  Pain location: left shoulder lateral just distal to joint Pain description: tingling when shoulder in improper posture. Aggravating factors: improper posture. Relieving factors: correcting posture   OBJECTIVE: (objective measures completed at initial evaluation unless otherwise dated)  COGNITION: Overall cognitive status: Within functional limits for tasks assessed   POSTURE: 05/09/2022:  rounded shoulders, forward head, and flexed trunk    LOWER EXTREMITY ROM:    UPPER EXTREMITY ROM:  ROM P:passive  A:active Left 06/23/22 Supine all limited by pain Left 08/01/22 Seated All painfree Right 08/01/22 Seated  Shoulder flexion 30* A: 148 A: 153  Shoulder extension 0* neutral A: 72 A: 74  Shoulder abduction 45* A: 135 A: 141  Shoulder adduction     Shoulder internal rotation 20* A: 84 A: 89  Shoulder external rotation 10* A: 88 A: 83  Elbow flexion     Elbow  extension -15*     (Blank rows = not tested)    ROM P:passive  A:active Right Eval 05/09/2022 Left Eval 05/09/2022 Bilateral 08/03/22  Hip flexion       Hip extension A: -25* standing A: -25* standing A: -15*  RW support standing  Hip abduction       Hip adduction       Hip internal rotation       Hip external rotation  Knee flexion       Knee extension       Ankle dorsiflexion NA P: -17*   Ankle plantarflexion NA     Ankle inversion NA     Ankle eversion NA      (Blank rows = not tested)   LOWER EXTREMITY MMT:   MMT Right Eval 05/09/2022 Left Eval 05/09/2022  Hip flexion      Hip extension Functional testing 3-/5 Functional testing 3-/5  Hip abduction Functional testing 3-/5 Functional testing 3-/5  Hip adduction      Hip internal rotation      Hip external rotation      Knee flexion Functional testing 3-/5 Functional testing 3-/5  Knee extension Functional testing 3/5 Functional testing 3/5  Ankle dorsiflexion      Ankle plantarflexion      Ankle inversion      Ankle eversion      (Blank rows = not tested)   POSTURE:  06/23/2022  seated - head forward, rounded shoulders and posterior pelvic tilt  TRANSFERS: 05/09/2022:  Sit to stand: SBA and requires use of armrests from 20" stable w/c & needs RW for stabilization 05/09/2022:  Stand to sit: SBA and requires use of armrests from 20" stable w/c & needs RW for stabilization   GAIT: 08/11/2022: Pt amb 100' with rollator, right TTA prosthesis & left AFO partial shoe filler with SBA. He demonstrates increased step length bil., intermittent impaired foot clearance on RLE  08/03/2022: 2-minute Walk Test with RW, right TTA prosthesis & left AFO partial shoe filler. Resting pre HR 70 SpO2 100% HR after activity HR 52  SpO2 100% light amount of light headness Distance 158' with supervision.   Gait pattern: step-to pattern, but distance improved since evaluation, Right hip hike, trunk flexed, and normal 4" BOS  Pt neg ramp &  curb with RW, ight TTA prosthesis & left AFO partial shoe filler with supervision. He required seated rest half way due to fatigue.    05/09/2022:  Gait pattern: step to pattern, decreased step length- Left, decreased stance time- Right, Right hip hike, antalgic, lateral hip instability, trunk flexed, and wide BOS Distance walked: 25' Assistive device utilized: Environmental consultant - 2 wheeled and TTA prosthesis RLE Level of assistance: Min A Comments: pt reports dizziness with turning in gait   UE FUNCTION: 06/23/2022:  Pt reports that he uses RUE for fine motor like writing but uses both hands for gross motor or sports.  Seated - pt unable to raise LUE in flexion or abd due to pain.  Supine - pt unable to move LUE in flexion or abd due to pain.  PROM flexion > pain than abd   Pt tolerates elbow ext with palm supinated but with palm neutral causes shoulder pain. Also painful with elbow already extended and pronate palm.   PT simulated pushing on RW thru LUE with manual resistance.  Pt reports slight increase in pain but not as sharp as above function.    FUNCTIONAL TESTs:  Sharlene Motts Balance Scale: 08/03/2022:  13/56 & tasks of Berg with RW support 45/56     Berg Balance Scale: 05/09/2022:  9/56 & tasks of Berg with RW support 27/56    [facility] PT Assessment - 05/09/22 0930                Berg Balance Test    Sit to Stand Needs moderate or maximal assist to stand   task with RW support = 3  Standing Unsupported Unable to stand 30 seconds unassisted   task with RW support = 3    Sitting with Back Unsupported but Feet Supported on Floor or Stool Able to sit safely and securely 2 minutes     Stand to Sit Uses backs of legs against chair to control descent   task with RW support = 3    Transfers Able to transfer safely, definite need of hands     Standing Unsupported with Eyes Closed Needs help to keep from falling   task with RW support = 3    Standing Unsupported with Feet Together Needs help to attain  position and unable to hold for 15 seconds   task with RW support = 2    From Standing, Reach Forward with Outstretched Arm Loses balance while trying/requires external support   task with RW support = 1    From Standing Position, Pick up Object from Floor Unable to try/needs assist to keep balance   task with RW support = 2    From Standing Position, Turn to Look Behind Over each Shoulder Needs assist to keep from losing balance and falling   task with RW support = 1    Turn 360 Degrees Needs assistance while turning   task with RW support = 0    Standing Unsupported, Alternately Place Feet on Step/Stool Needs assistance to keep from falling or unable to try   task with RW support = 0    Standing Unsupported, One Foot in [employer] balance while stepping or standing   task with RW support = 1    Standing on One Leg Unable to try or needs assist to prevent fall   task with RW support = 1    Total Score 9     Berg comment: tasks of Berg with RW support 27/56                     CURRENT PROSTHETIC WEAR ASSESSMENT: 08/03/2022: Pt wears both right TTA prosthesis & left AFO partial shoe filler>90% of awake hours.  He has 2 wounds on dorsum of left foot being monitored by PCP.  He is wearing left Vive Wear compression sock below knee on LLE which has helped wounds to heal. Pt is independent in donning & doffing both prosthesis & AFO. Skin check, cleaning and minor cues on adjusting ply socks.   05/09/2022:   Patient is dependent with: skin check, residual limb care, care of non-amputated limb, prosthetic cleaning, ply sock cleaning, and correct ply sock adjustment Donning prosthesis: Modified independence Doffing prosthesis: Modified independence Prosthetic wear tolerance: >90% of awake hours/day, 7 days/week Prosthetic weight bearing tolerance: 5 minutes Edema: LLE pitting edema with >10 sec capillary refill Residual limb condition: Right BKA limb has no openings, normal temperature &  moisture.  Redness patella & patella tendon from friction of liner with knee flexed. Cylinderical shape.  LLE TMA pt's wife showed picture of wound on dorsum of foot that is being managed by PCP.   Prosthetic description: silicon liner with pin lock suspension, total contact socket, dynamic response foot.      TODAY'S TREATMENT:  09/06/22 Prosthetic Training with right TTA prosthesis & left dynamic AFO and custom shoe with toe filler and carbon plate: Monitored SPO2 and HR during session after bouts or activity due to [facility] ranges were SpO2 93-99%  and HR 60-77  Pt ambulated 155' in with RW as max tolerable distance. Then another 92' with  RW at end of session Rocker board in // bars with UE support X 20 A-P and X 20 lateral Balance on airex pad with feet normal width apart 30 sec X 3 Sidestepping in //bar 3 round trips Step ups in //bars with bilat UE support on 6 inch step X 5 each side   09/01/2022 Prosthetic Training with right TTA prosthesis & left dynamic AFO and custom shoe with toe filler and carbon plate: Resting SpO2 98% HR 72  Pt ambulated 155' in 2 min with RW as max tolerable distance.  He reports congestion with sinus issue limiting breathing & distance.  Post gait SpO2 93% HR 88  pt verbally instructed in "Talk-Sing" test for guidance on gait. Pt verbalized understanding. Stairs with 1 rail & cane 4 steps (2 ea rail) with supervision. PT demo & verbal cues.  PT instructed in recommendations with [place] for [place]-up.  Pt & wife verbalized understanding.  Pt neg ramp & curb with RW with supervision & no LOB.    08/30/2022 Prosthetic Training with right TTA prosthesis & left dynamic AFO and custom shoe with toe filler and carbon plate: Prosthetist's changes for AFO & prosthesis improved standing & gait stability. PT cues for standing balance to look forward as staring at floor pulls his weight anteriorly.  Pt able to stand statically for 30 sec on 3rd attempt.  Pt  able to stand / sit from 18" chair without armrests using UEs to rollator walker.  PT demo & verbal cues on sit to/from stand from rolling desk chair. Pt return demo with supervision.  PT demo & verbal cues on moving chair under table.  Pt return demo understanding.  PT recommended not sitting in w/c in home to facilitate more mobility. PT recommended walking in/out of home & community with RW or rollator to facilitate independence for community mobility.  Now that he has rollator walker with seat that allows rest as needed, PT recommended 1-2x/day walking max tolerable level to increase endurance for longer activities. Pt verbalized understanding.    HOME EXERCISE PROGRAM  Access Code: [email or web address] URL: [email or web address] Date: 07/05/2022 Prepared by: Jamey Reas  Exercises - Seated Correct Posture  - 1 x daily - 7 x weekly - Seated Scapular Retraction  - 2-5 x daily - 7 x weekly - 1 sets - 10 reps - 5 seconds hold - Supine Chin Tucks on Flat Ball  - 2-5 x daily - 7 x weekly - 1 sets - 10 reps - 5 seconds hold - Seated Isometric Cervical Retraction with Chin Tucks with Pillow behind head  - 2-5 x daily - 7 x weekly - 1 sets - 10 reps - 5 seconds hold - Circular Shoulder Pendulum with Table Support  - 1-2 x daily - 7 x weekly - 1-2 sets - 10 reps - 5 seconds hold - Flexion-Extension Shoulder Pendulum with Table Support  - 1-2 x daily - 7 x weekly - 1-2 sets - 10 reps - Horizontal Shoulder Pendulum with Table Support  - 1-2 x daily - 7 x weekly - 1-2 sets - 10 reps - Supine Shoulder Flexion Extension AAROM with Dowel  - 1 x daily - 7 x weekly - 1-2 sets - 10 reps - 5 seconds hold - Supine Shoulder Abduction AAROM with Dowel  - 1 x daily - 7 x weekly - 1-2 sets - 10 reps - 5 seconds hold - Supine Shoulder External Rotation with Dowel  - 1 x  daily - 7 x weekly - 1-2 sets - 10 reps - 5 seconds hold   Neuromuscular Reed-  cues on proprioception RLE using residual limb, balance &  standing tolerance.   Do each exercise 1-2  times per day Do each exercise 5-10 repetitions Hold each exercise for 2 seconds to feel your location  AT [place].  Try to find this position when standing still for activities.   USE TAPE ON FLOOR TO MARK THE MIDLINE POSITION which is even with middle of sink.  You also should try to feel with your limb pressure in socket.  You are trying to feel with limb what you used to feel with the bottom of your foot.  Side to Side Shift: Moving your hips only (not shoulders): move weight onto your left leg, HOLD/FEEL pressure in socket.  Move back to equal weight on each leg, HOLD/FEEL pressure in socket. Move weight onto your right leg, HOLD/FEEL pressure in socket. Move back to equal weight on each leg, HOLD/FEEL pressure in socket. Repeat.  Start with both hands on sink, progress to hand on prosthetic side only, then no hands.  Front to Back Shift: Moving your hips only (not shoulders): move your weight forward onto your toes, HOLD/FEEL pressure in socket. Move your weight back to equal Flat Foot on both legs, HOLD/FEEL  pressure in socket. Move your weight back onto your heels, HOLD/FEEL  pressure in socket. Move your weight back to equal on both legs, HOLD/FEEL  pressure in socket. Repeat.  Start with both hands on sink, progress to hand on prosthetic side only, then no hands.  Moving Cones / Cups: With equal weight on each leg: Hold on with one hand the first time, then progress to no hand supports. Move cups from one side of sink to the other. Place cups ~2" out of your reach, progress to 10" beyond reach.  Place one hand in middle of sink and reach with other hand. Do both arms.  Then hover one hand and move cups with other hand.  Overhead/Upward Reaching: alternated reaching up to top cabinets or ceiling if no cabinets present. Keep equal weight on each leg. Start with one hand support on  counter while other hand reaches and progress to no hand support with reaching.  ace one hand in middle of sink and reach with other hand. Do both arms.  Then hover one hand and move cups with other hand.  5.   Looking Over Shoulders: With equal weight on each leg: alternate turning to look over your shoulders with one hand support on counter as needed.  Start with head motions only to look in front of shoulder, then even with shoulder and progress to looking behind you. To look to side, move head /eyes, then shoulder on side looking pulls back, shift more weight to side looking and pull hip back. Place one hand in middle of sink and let go with other hand so your shoulder can pull back. Switch hands to look other way.   Then hover one hand and look over shoulder. If looking right, use left hand at sink. If looking left, use right hand at sink. 6.  Alternate Tapping Toes or Stepping to lower shelf of cabinet  Move items under cabinet out of your way. Shift your hips/pelvis so weight on stance leg & Tighten muscles in hip.  SLOWLY step other leg so front of foot is  in cabinet. Then step back to floor. Repeat with other leg.     ASSESSMENT:   CLINICAL IMPRESSION: Patient was limited some today by fatigue, cough, and SHOB (he relays pulmonary fibrosis flare up). I provided appropriate rest breaks and closely monitored HR and SPO2 levels which did stay in safe ranges. He will need progress note next visit.    OBJECTIVE IMPAIRMENTS .Abnormal gait, cardiopulmonary status limiting activity, decreased activity tolerance, decreased balance, decreased endurance, decreased knowledge of use of DME, decreased mobility, decreased ROM, decreased strength, dizziness, increased edema, impaired flexibility, postural dysfunction, prosthetic dependency , obesity, and pain.    ACTIVITY LIMITATIONS carrying, lifting, bending, standing, stairs, transfers, locomotion level, UE use with ADLs, sleep    PARTICIPATION  LIMITATIONS: driving, community activity, and yard work, UE use with ADLs &  household activities   [place], Time since onset of injury/illness/exacerbation, and 3+ comorbidities: see PMH  are also affecting patient's functional outcome.    REHAB POTENTIAL: Good   CLINICAL DECISION MAKING: Evolving/moderate complexity   EVALUATION COMPLEXITY: Moderate     GOALS: Goals reviewed with patient? Yes  SHORT TERM GOALS: Target date: 09/01/2022  1. Patient able to stand with no UE support for 30 seconds with supervision. Baseline: SEE OBJECTIVE DATA Goal status: MET 08/30/2022   2. Patient able to stand & sit from 18" without armrests using his UEs to RW safely Baseline: SEE OBJECTIVE DATA Goal status: MET 08/30/2022  3. Patient ambulates up to 200' with RW, prosthesis & AFO with supervision.  Goal status: partially met 09/01/2022  4. Patient is able to negotiate a ramp & curb with RW with wife's supervision to enable basic community access without w/c.  Goal status: MET 09/01/2022     UPDATED LONG TERM GOALS: Target date: 10/26/2022   Patient demonstrates & verbalized understanding of prosthetic care to enable safe utilization of prosthesis. Baseline: SEE OBJECTIVE DATA Goal status: ongoing 08/03/22    Patient tolerates right prosthesis & left AFO / partial foot prosthesis wear >90% of awake hours without skin or limb pain issues. Baseline: SEE OBJECTIVE DATA Goal status: ongoing 08/03/22    Berg Balance >/= 20/56 and tasks of Berg with RW support >/= 52/56 to indicate lower fall risk with standing ADLs Baseline: SEE OBJECTIVE DATA Goal status: 08/03/2022 updated    Patient ambulates >300' with prosthesis & LRAD modified independent Baseline: SEE OBJECTIVE DATA Goal status: ongoing 08/03/2022   Patient negotiates ramps, curbs & stairs with single rail with prosthesis & LRAD modified independent. Baseline: SEE OBJECTIVE DATA Goal status: ongoing 08/03/22  PLAN: PT  FREQUENCY: 2x/week   PT DURATION: 12 weeks  PLANNED INTERVENTIONS: Therapeutic exercises, Therapeutic activity, Neuromuscular re-education, Balance training, Gait training, Patient/Family education, Self Care, Stair training, Vestibular training, Canalith repositioning, Prosthetic training, DME instructions, Re-evaluation, and physical performance testing    PLAN FOR NEXT SESSION:  He will need progress note. set updated STGs. Continue to progress standing balance and prosthetic gait with rollator   Debbe Odea, PT, DPT 09/06/2022, 9:26 AM

## 2022-09-08 ENCOUNTER — Encounter: Payer: Self-pay | Admitting: Physical Therapy

## 2022-09-08 ENCOUNTER — Ambulatory Visit (INDEPENDENT_AMBULATORY_CARE_PROVIDER_SITE_OTHER): Payer: Medicare Other | Admitting: Physical Therapy

## 2022-09-08 DIAGNOSIS — M6281 Muscle weakness (generalized): Secondary | ICD-10-CM | POA: Diagnosis not present

## 2022-09-08 DIAGNOSIS — R2689 Other abnormalities of gait and mobility: Secondary | ICD-10-CM | POA: Diagnosis not present

## 2022-09-08 DIAGNOSIS — R293 Abnormal posture: Secondary | ICD-10-CM | POA: Diagnosis not present

## 2022-09-08 DIAGNOSIS — R2681 Unsteadiness on feet: Secondary | ICD-10-CM | POA: Diagnosis not present

## 2022-09-08 DIAGNOSIS — Z9181 History of falling: Secondary | ICD-10-CM

## 2022-09-08 NOTE — Therapy (Signed)
OUTPATIENT PHYSICAL THERAPY TREATMENT NOTE & PROGRESS NOTE   Patient Name: Shane [name] MRN: [id number] DOB:Apr 10, 1941, 81 y.o., male Today's Date: 09/08/2022  PCP: Burnard Bunting, MD REFERRING PROVIDER: [name], MD   Progress Note Reporting Period 08/10/2022 to 09/08/2022  See note below for Objective Data and Assessment of Progress/Goals.      END OF SESSION:   PT End of Session - 09/08/22 0805     Visit Number 33    Number of Visits 47    Date for PT Re-Evaluation 10/26/22    Authorization Type UHC Medicare    Authorization Time Period UHC MEDICARE  $20 COPAY    Progress Note Due on Visit 9    PT Start Time 0800    PT Stop Time 0842    PT Time Calculation (min) 42 min    Equipment Utilized During Treatment Gait belt    Activity Tolerance Patient tolerated treatment well;Patient limited by fatigue    Behavior During Therapy WFL for tasks assessed/performed                Past Medical History:  Diagnosis Date   Anxiety    AV block, Mobitz 1    Cataract    Chronic kidney disease    d/t DM   CKD (chronic kidney disease), stage III ([place])    Depression    Diabetes mellitus    Vgo disposal insulin bolus  simular to insulin pump   Dyspnea    GERD (gastroesophageal reflux disease)    History of kidney stones    passed   Hyperlipidemia    Hypertension    Idiopathic pulmonary fibrosis ([place]) 11/2016   ILD (interstitial lung disease) ([place])    Moderate aortic stenosis    a. 10/2019 Echo: EF 55-60%, Gr2 DD. Nl RV.    Neuromuscular disorder ([place])    Neuropathy associated with endocrine disorder ([place])    Nonobstructive CAD (coronary artery disease)    a. 2012 Cath: mod, nonobs dzs; b. 10/2016 MV: EF 60%, no ischemia.   OSA on CPAP 05/05/2017   Unattended Home Sleep Test 7/2/813-AHI 38.6/hour, desaturation to 64%, body weight 261 pounds   PONV (postoperative nausea and vomiting)    Sleep apnea     uses cpap asked to bring mask and tubing   Past  Surgical History:  Procedure Laterality Date   ABDOMINAL AORTOGRAM W/LOWER EXTREMITY N/A 12/10/2020   Procedure: ABDOMINAL AORTOGRAM W/LOWER EXTREMITY;  Surgeon: Cherre Robins, MD;  Location: [place] [facility];  Service: Cardiovascular;  Laterality: N/A;   AMPUTATION Right 01/22/2021   Procedure: 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;  Surgeon: [name], MD;  Location: [place];  Service: Orthopedics;  Laterality: Right;   AMPUTATION Right 03/17/2021   Procedure: RIGHT BELOW KNEE AMPUTATION;  Surgeon: [name], MD;  Location: [place];  Service: Orthopedics;  Laterality: Right;   ANKLE FUSION Right 01/22/2021   Procedure: RIGHT FOOT TIBIOCALCANEAL FUSION;  Surgeon: [name], MD;  Location: [place];  Service: Orthopedics;  Laterality: Right;   ANTERIOR FUSION CERVICAL SPINE  2012   CARDIAC CATHETERIZATION  2011   CARDIAC CATHETERIZATION N/A 11/09/2016   Procedure: Right Heart Cath;  Surgeon: Belva Crome, MD;  Location: [facility];  Service: Cardiovascular;  Laterality: N/A;   carpel tunnel     left wrist   CATARACT EXTRACTION     CATARACT EXTRACTION W/ INTRAOCULAR LENS  IMPLANT, BILATERAL  2013   CERVICAL LAMINECTOMY  2012   COLONOSCOPY N/A 01/14/2013   Procedure: COLONOSCOPY;  Surgeon: Irene Shipper, MD;  Location: [facility];  Service: Endoscopy;  Laterality: N/A;   CORONARY ARTERY BYPASS GRAFT  11/04/2020   LIMA-LAD, SVG-OM1, SVG-PDA (Dr Marney [facility]) dc 11/18/2020   EYE SURGERY     I & D EXTREMITY Right 02/19/2021   Procedure: RIGHT ANKLE DEBRIDEMENT AND PLACEMENT ANTIBIOTIC BEADS;  Surgeon: [name], MD;  Location: [place];  Service: Orthopedics;  Laterality: Right;   KNEE SURGERY  1998   left   LEFT HEART CATH AND CORONARY ANGIOGRAPHY N/A 07/10/2020   Procedure: LEFT HEART CATH AND CORONARY ANGIOGRAPHY;  Surgeon: Sherren Mocha, MD;  Location: [place] [facility];  Service: Cardiovascular;  Laterality: N/A;   LUMBAR LAMINECTOMY  2003   LUNG BIOPSY Left 12/26/2016    Procedure: LUNG BIOPSY;  Surgeon: [name], MD;  Location: [place];  Service: Thoracic;  Laterality: Left;   PACEMAKER IMPLANT N/A 03/30/2020   Procedure: PACEMAKER IMPLANT;  Surgeon: Evans Lance, MD;  Location: [place] [facility];  Service: Cardiovascular;  Laterality: N/A;   PERIPHERAL VASCULAR INTERVENTION Right 12/10/2020   Procedure: PERIPHERAL VASCULAR INTERVENTION;  Surgeon: Cherre Robins, MD;  Location: [place] [facility];  Service: Cardiovascular;  Laterality: Right;  SFA   POSTERIOR FUSION CERVICAL SPINE  2012   PPM GENERATOR REMOVAL N/A 12/14/2020   Procedure: PPM GENERATOR REMOVAL;  Surgeon: Evans Lance, MD;  Location: [place] [facility];  Service: Cardiovascular;  Laterality: N/A;   TEE WITHOUT CARDIOVERSION N/A 12/11/2020   Procedure: TRANSESOPHAGEAL ECHOCARDIOGRAM (TEE);  Surgeon: Geralynn Rile, MD;  Location: [place];  Service: Cardiovascular;  Laterality: N/A;   TRIGGER FINGER RELEASE  2011   4th finger left hand   VIDEO ASSISTED THORACOSCOPY Left 12/26/2016   Procedure: VIDEO ASSISTED THORACOSCOPY;  Surgeon: [name], MD;  Location: [place];  Service: Thoracic;  Laterality: Left;   VIDEO BRONCHOSCOPY N/A 12/26/2016   Procedure: VIDEO BRONCHOSCOPY;  Surgeon: [name], MD;  Location: [place];  Service: Thoracic;  Laterality: N/A;   Patient Active Problem List   Diagnosis Date Noted   Intermediate stage nonexudative age-related macular degeneration of both eyes 04/25/2022   Research study patient 12/24/2021   Post-operative pain    CKD (chronic kidney disease), stage II    Labile blood glucose    Pulmonary fibrosis ([place])    Hyponatremia    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[place]) 03/26/2021   S/P BKA (below knee amputation) unilateral, right ([place])    Shortness of breath    Acute blood loss anemia    Stage 3 chronic kidney disease ([place])    Atrial fibrillation ([place])    Controlled type 2 diabetes mellitus with hyperglycemia, with  long-term current use of insulin ([facility])    Postoperative pain    Postoperative hemorrhagic shock 03/20/2021   Atherosclerosis of native arteries of extremities with gangrene, right leg ([place])    Hardware complicating wound infection ([place])    Abscess of bursa of right ankle 02/15/2021   Endocarditis 01/19/2021   Peripheral vascular disease ([place]) 12/29/2020   CHF (congestive heart failure), NYHA class II, chronic, diastolic ([place]) 73/71/0626   History of COVID-19 12/22/2020   SSS (sick sinus syndrome) ([place]) 12/22/2020   Acute bacterial endocarditis    MSSA bacteremia 12/09/2020   Diabetic foot ulcer ([place]) 12/09/2020   Foot drop, right foot    Generalized weakness 12/08/2020   Dehydration with  hyponatremia 12/08/2020   Atrial fibrillation, chronic ([place]) 12/08/2020   Elevated troponin level not due myocardial infarction 12/08/2020   Adrenal insufficiency ([place]) 11/14/2020   Orthostatic hypotension 11/14/2020   Physical deconditioning 11/14/2020   Difficulty sleeping 11/07/2020   Postoperative anemia due to acute blood loss 11/07/2020   Right ventricular dysfunction 11/05/2020   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/04/2020   Hearing loss 11/03/2020   Cardiac device in situ 09/09/2020   Coronary atherosclerosis due to lipid rich plaque 09/02/2020   COVID-19 virus infection 07/18/2020   Coronary artery disease involving native heart without angina pectoris 07/10/2020   Chronic kidney disease, stage 3a ([place]) 03/29/2020   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/28/2020   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 ([place]) 03/16/2020   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 ([place]) 03/16/2020   Right epiretinal membrane 03/16/2020   Posterior vitreous detachment of right eye 03/16/2020   Aortic stenosis, moderate 03/12/2020   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   Near syncope 04/27/2018   Hypoglycemia due to  insulin 01/06/2018   Essential hypertension 01/06/2018   Diabetes mellitus type 2, with complication, on long term insulin pump ([place]) 01/06/2018   Syncope and collapse 01/05/2018   Encounter for therapeutic drug monitoring 06/29/2017   OSA on CPAP 05/05/2017   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[place]) 01/05/2017   Abnormal chest x-ray 10/12/2016   Benign neoplasm of colon 01/14/2013    REFERRING DIAG: Z89.511 (ICD-10-CM) - Hx of right BKA   ONSET DATE: 05/04/2022 MD visit with release to WB on LLE  THERAPY DIAG:  Unsteadiness on feet  Other abnormalities of gait and mobility  Muscle weakness (generalized)  Abnormal posture  History of falling  Rationale for Evaluation and Treatment Rehabilitation  PERTINENT HISTORY: CKD st III, AV block Mobitz, pacemaker, pulmonary fibrosis, CAD, CABG 21, HTN, A-Fib, DM2, PAD, right BKA, Left TMA  PRECAUTIONS: ICD/Pacemaker - continue monitoring vitals with activity and calculated HRmax is 151 bpm  SUBJECTIVE:  His breathing is slowly improving.  He is trying to walk more inside home as PT recommended but limited by his breathing.   PAIN:    Are you having pain? Yes: NPRS scale: today  0/10, over last week lowest 0/10  Pain location: left shoulder lateral just distal to joint Pain description: tingling when shoulder in improper posture. Aggravating factors: improper posture. Relieving factors: correcting posture   OBJECTIVE: (objective measures completed at initial evaluation unless otherwise dated)  COGNITION: Overall cognitive status: Within functional limits for tasks assessed   POSTURE: 05/09/2022:  rounded shoulders, forward head, and flexed trunk    LOWER EXTREMITY ROM:    UPPER EXTREMITY ROM:  ROM P:passive  A:active Left 06/23/22 Supine all limited by pain Left 08/01/22 Seated All painfree Right 08/01/22 Seated  Shoulder flexion 30* A: 148 A: 153  Shoulder extension 0* neutral A: 72 A: 74  Shoulder abduction 45* A:  135 A: 141  Shoulder adduction     Shoulder internal rotation 20* A: 84 A: 89  Shoulder external rotation 10* A: 88 A: 83  Elbow flexion     Elbow extension -15*     (Blank rows = not tested)    ROM P:passive  A:active Right Eval 05/09/2022 Left Eval 05/09/2022 Bilateral 08/03/22  Hip flexion       Hip extension A: -25* standing A: -25* standing A: -15*  RW support standing  Hip  abduction       Hip adduction       Hip internal rotation       Hip external rotation       Knee flexion       Knee extension       Ankle dorsiflexion NA P: -17*   Ankle plantarflexion NA     Ankle inversion NA     Ankle eversion NA      (Blank rows = not tested)   LOWER EXTREMITY MMT:   MMT Right Eval 05/09/2022 Left Eval 05/09/2022  Hip flexion      Hip extension Functional testing 3-/5 Functional testing 3-/5  Hip abduction Functional testing 3-/5 Functional testing 3-/5  Hip adduction      Hip internal rotation      Hip external rotation      Knee flexion Functional testing 3-/5 Functional testing 3-/5  Knee extension Functional testing 3/5 Functional testing 3/5  Ankle dorsiflexion      Ankle plantarflexion      Ankle inversion      Ankle eversion      (Blank rows = not tested)   POSTURE:  06/23/2022  seated - head forward, rounded shoulders and posterior pelvic tilt  TRANSFERS: 05/09/2022:  Sit to stand: SBA and requires use of armrests from 20" stable w/c & needs RW for stabilization 05/09/2022:  Stand to sit: SBA and requires use of armrests from 20" stable w/c & needs RW for stabilization   GAIT: 08/11/2022: Pt amb 100' with rollator, right TTA prosthesis & left AFO partial shoe filler with SBA. He demonstrates increased step length bil., intermittent impaired foot clearance on RLE  08/03/2022: 2-minute Walk Test with RW, right TTA prosthesis & left AFO partial shoe filler. Resting pre HR 70 SpO2 100% HR after activity HR 52  SpO2 100% light amount of light headness Distance 158' with  supervision.   Gait pattern: step-to pattern, but distance improved since evaluation, Right hip hike, trunk flexed, and normal 4" BOS  Pt neg ramp & curb with RW, ight TTA prosthesis & left AFO partial shoe filler with supervision. He required seated rest half way due to fatigue.    05/09/2022:  Gait pattern: step to pattern, decreased step length- Left, decreased stance time- Right, Right hip hike, antalgic, lateral hip instability, trunk flexed, and wide BOS Distance walked: 25' Assistive device utilized: Environmental consultant - 2 wheeled and TTA prosthesis RLE Level of assistance: Min A Comments: pt reports dizziness with turning in gait   UE FUNCTION: 06/23/2022:  Pt reports that he uses RUE for fine motor like writing but uses both hands for gross motor or sports.  Seated - pt unable to raise LUE in flexion or abd due to pain.  Supine - pt unable to move LUE in flexion or abd due to pain.  PROM flexion > pain than abd   Pt tolerates elbow ext with palm supinated but with palm neutral causes shoulder pain. Also painful with elbow already extended and pronate palm.   PT simulated pushing on RW thru LUE with manual resistance.  Pt reports slight increase in pain but not as sharp as above function.    FUNCTIONAL TESTs:  Merrilee Jansky Balance Scale: 08/03/2022:  13/56 & tasks of Berg with RW support 45/56     Berg Balance Scale: 05/09/2022:  9/56 & tasks of Berg with RW support 27/56    [facility] PT Assessment - 05/09/22 0930  Berg Balance Test    Sit to Stand Needs moderate or maximal assist to stand   task with RW support = 3    Standing Unsupported Unable to stand 30 seconds unassisted   task with RW support = 3    Sitting with Back Unsupported but Feet Supported on Floor or Stool Able to sit safely and securely 2 minutes     Stand to Sit Uses backs of legs against chair to control descent   task with RW support = 3    Transfers Able to transfer safely, definite need of hands     Standing  Unsupported with Eyes Closed Needs help to keep from falling   task with RW support = 3    Standing Unsupported with Feet Together Needs help to attain position and unable to hold for 15 seconds   task with RW support = 2    From Standing, Reach Forward with Outstretched Arm Loses balance while trying/requires external support   task with RW support = 1    From Standing Position, Pick up Object from Floor Unable to try/needs assist to keep balance   task with RW support = 2    From Standing Position, Turn to Look Behind Over each Shoulder Needs assist to keep from losing balance and falling   task with RW support = 1    Turn 360 Degrees Needs assistance while turning   task with RW support = 0    Standing Unsupported, Alternately Place Feet on Step/Stool Needs assistance to keep from falling or unable to try   task with RW support = 0    Standing Unsupported, One Foot in [employer] balance while stepping or standing   task with RW support = 1    Standing on One Leg Unable to try or needs assist to prevent fall   task with RW support = 1    Total Score 9     Berg comment: tasks of Berg with RW support 27/56                     CURRENT PROSTHETIC WEAR ASSESSMENT: 08/03/2022: Pt wears both right TTA prosthesis & left AFO partial shoe filler>90% of awake hours.  He has 2 wounds on dorsum of left foot being monitored by PCP.  He is wearing left Vive Wear compression sock below knee on LLE which has helped wounds to heal. Pt is independent in donning & doffing both prosthesis & AFO. Skin check, cleaning and minor cues on adjusting ply socks.   05/09/2022:   Patient is dependent with: skin check, residual limb care, care of non-amputated limb, prosthetic cleaning, ply sock cleaning, and correct ply sock adjustment Donning prosthesis: Modified independence Doffing prosthesis: Modified independence Prosthetic wear tolerance: >90% of awake hours/day, 7 days/week Prosthetic weight bearing tolerance:  5 minutes Edema: LLE pitting edema with >10 sec capillary refill Residual limb condition: Right BKA limb has no openings, normal temperature & moisture.  Redness patella & patella tendon from friction of liner with knee flexed. Cylinderical shape.  LLE TMA pt's wife showed picture of wound on dorsum of foot that is being managed by PCP.   Prosthetic description: silicon liner with pin lock suspension, total contact socket, dynamic response foot.      TODAY'S TREATMENT:  09/08/2022 Neuromuscular Re-ed: Standing with back of hands on //bars to minimize UE weight bearing:  Eyes open then eyes closed ea direction 5 reps - right/left, up/down &  2 diagonals;  standing on foam with light UE support EO 4 head directions 5 reps. Stepping onto foam mat for stance and tapping cone anterior to foam mat with contralateral LE for 10 reps ea LE with BUE support Stepping onto foam mat for stance and tapping cone anterior-lateral for hip abduction to foam mat with contralateral LE for 10 reps ea LE with BUE support Sit to/from stand without UE assist from 24" stool with minA/tactile cues 5 reps Quarter turns CW & CCW with BUE //bars with PT demo & cues on technique.  Therapeutic Activities: PT demo & verbal cues on technique to pick up items off floor with bil. Ankles stiff (AFO & prosthesis) with single UE support.  Pt performed with RLE & LLE in forward position & reached with RUE & LUE.  Pt unable to reach floor reaching with RUE with either foot position.  He was more stable & reported more comfortable with LLE forward position reaching with LUE (RUE on //bar).  Pt performed all reaching with PT min guard & support for safety.    09/06/22 Prosthetic Training with right TTA prosthesis & left dynamic AFO and custom shoe with toe filler and carbon plate: Monitored SPO2 and HR during session after bouts or activity due to [facility] ranges were SpO2 93-99%  and HR 60-77  Pt ambulated 155' in with RW as max tolerable  distance. Then another 34' with RW at end of session Rocker board in // bars with UE support X 20 A-P and X 20 lateral Balance on airex pad with feet normal width apart 30 sec X 3 Sidestepping in //bar 3 round trips Step ups in //bars with bilat UE support on 6 inch step X 5 each side   09/01/2022 Prosthetic Training with right TTA prosthesis & left dynamic AFO and custom shoe with toe filler and carbon plate: Resting SpO2 98% HR 72  Pt ambulated 155' in 2 min with RW as max tolerable distance.  He reports congestion with sinus issue limiting breathing & distance.  Post gait SpO2 93% HR 88  pt verbally instructed in "Talk-Sing" test for guidance on gait. Pt verbalized understanding. Stairs with 1 rail & cane 4 steps (2 ea rail) with supervision. PT demo & verbal cues.  PT instructed in recommendations with stairs at [place] for [place]-up.  Pt & wife verbalized understanding.  Pt neg ramp & curb with RW with supervision & no LOB.    HOME EXERCISE PROGRAM  Access Code: [email or web address] URL: [email or web address] Date: 07/05/2022 Prepared by: Jamey Reas  Exercises - Seated Correct Posture  - 1 x daily - 7 x weekly - Seated Scapular Retraction  - 2-5 x daily - 7 x weekly - 1 sets - 10 reps - 5 seconds hold - Supine Chin Tucks on Flat Ball  - 2-5 x daily - 7 x weekly - 1 sets - 10 reps - 5 seconds hold - Seated Isometric Cervical Retraction with Chin Tucks with Pillow behind head  - 2-5 x daily - 7 x weekly - 1 sets - 10 reps - 5 seconds hold - Circular Shoulder Pendulum with Table Support  - 1-2 x daily - 7 x weekly - 1-2 sets - 10 reps - 5 seconds hold - Flexion-Extension Shoulder Pendulum with Table Support  - 1-2 x daily - 7 x weekly - 1-2 sets - 10 reps - Horizontal Shoulder Pendulum with Table Support  - 1-2 x daily - 7 x weekly - 1-2  sets - 10 reps - Supine Shoulder Flexion Extension AAROM with Dowel  - 1 x daily - 7 x weekly - 1-2 sets - 10 reps - 5 seconds hold -  Supine Shoulder Abduction AAROM with Dowel  - 1 x daily - 7 x weekly - 1-2 sets - 10 reps - 5 seconds hold - Supine Shoulder External Rotation with Dowel  - 1 x daily - 7 x weekly - 1-2 sets - 10 reps - 5 seconds hold   Neuromuscular Reed-  cues on proprioception RLE using residual limb, balance & standing tolerance.   Do each exercise 1-2  times per day Do each exercise 5-10 repetitions Hold each exercise for 2 seconds to feel your location  AT [place].  Try to find this position when standing still for activities.   USE TAPE ON FLOOR TO MARK THE MIDLINE POSITION which is even with middle of sink.  You also should try to feel with your limb pressure in socket.  You are trying to feel with limb what you used to feel with the bottom of your foot.  Side to Side Shift: Moving your hips only (not shoulders): move weight onto your left leg, HOLD/FEEL pressure in socket.  Move back to equal weight on each leg, HOLD/FEEL pressure in socket. Move weight onto your right leg, HOLD/FEEL pressure in socket. Move back to equal weight on each leg, HOLD/FEEL pressure in socket. Repeat.  Start with both hands on sink, progress to hand on prosthetic side only, then no hands.  Front to Back Shift: Moving your hips only (not shoulders): move your weight forward onto your toes, HOLD/FEEL pressure in socket. Move your weight back to equal Flat Foot on both legs, HOLD/FEEL  pressure in socket. Move your weight back onto your heels, HOLD/FEEL  pressure in socket. Move your weight back to equal on both legs, HOLD/FEEL  pressure in socket. Repeat.  Start with both hands on sink, progress to hand on prosthetic side only, then no hands.  Moving Cones / Cups: With equal weight on each leg: Hold on with one hand the first time, then progress to no hand supports. Move cups from one side of sink to the other. Place cups ~2" out of your reach, progress to 10"  beyond reach.  Place one hand in middle of sink and reach with other hand. Do both arms.  Then hover one hand and move cups with other hand.  Overhead/Upward Reaching: alternated reaching up to top cabinets or ceiling if no cabinets present. Keep equal weight on each leg. Start with one hand support on counter while other hand reaches and progress to no hand support with reaching.  ace one hand in middle of sink and reach with other hand. Do both arms.  Then hover one hand and move cups with other hand.  5.   Looking Over Shoulders: With equal weight on each leg: alternate turning to look over your shoulders with one hand support on counter as needed.  Start with head motions only to look in front of shoulder, then even with shoulder and progress to looking behind you. To look to side, move head /eyes, then shoulder on side looking pulls back, shift more weight to side looking and pull hip back. Place one hand in middle of sink and let go with other hand so your shoulder can pull back. Switch hands to look other way.  Then hover one hand and look over shoulder. If looking right, use left hand at sink. If looking left, use right hand at sink. 6.  Alternate Tapping Toes or Stepping to lower shelf of cabinet  Move items under cabinet out of your way. Shift your hips/pelvis so weight on stance leg & Tighten muscles in hip.  SLOWLY step other leg so front of foot is in cabinet. Then step back to floor. Repeat with other leg.     ASSESSMENT:   CLINICAL IMPRESSION: Patient was limited in standing & walking this week by [facility] / breathing but is improving.  Patient has improved his mobility & balance over last 10 PT visits with both pt & his wife note differences.  PT continues to work on safety & mobility with overall goal for him to be independent at basic community level.  Pt continues to benefit from skilled PT.   OBJECTIVE IMPAIRMENTS .Abnormal gait, cardiopulmonary status limiting activity, decreased  activity tolerance, decreased balance, decreased endurance, decreased knowledge of use of DME, decreased mobility, decreased ROM, decreased strength, dizziness, increased edema, impaired flexibility, postural dysfunction, prosthetic dependency , obesity, and pain.    ACTIVITY LIMITATIONS carrying, lifting, bending, standing, stairs, transfers, locomotion level, UE use with ADLs, sleep    PARTICIPATION LIMITATIONS: driving, community activity, and yard work, UE use with ADLs &  household activities   [place], Time since onset of injury/illness/exacerbation, and 3+ comorbidities: see PMH  are also affecting patient's functional outcome.    REHAB POTENTIAL: Good   CLINICAL DECISION MAKING: Evolving/moderate complexity   EVALUATION COMPLEXITY: Moderate     GOALS: Goals reviewed with patient? Yes  SHORT TERM GOALS: [place] date: 09/29/2022  1. Patient able to pick up items from floor with single UE support on walker or surface. Baseline: SEE OBJECTIVE DATA Goal status: set 09/08/2022  2. Patient reports ambulating in home & entering/exiting home >90% of time over w/c use.  Baseline: SEE OBJECTIVE DATA Goal status: set 09/08/2022  3. Patient ambulates up to 200' with RW, prosthesis & AFO with supervision.  Goal status: ongoing 09/01/2022     UPDATED LONG TERM GOALS: Target date: 10/26/2022   Patient demonstrates & verbalized understanding of prosthetic care to enable safe utilization of prosthesis. Baseline: SEE OBJECTIVE DATA Goal status: ongoing 08/03/22    Patient tolerates right prosthesis & left AFO / partial foot prosthesis wear >90% of awake hours without skin or limb pain issues. Baseline: SEE OBJECTIVE DATA Goal status: ongoing 08/03/22    Berg Balance >/= 20/56 and tasks of Berg with RW support >/= 52/56 to indicate lower fall risk with standing ADLs Baseline: SEE OBJECTIVE DATA Goal status: 08/03/2022 updated    Patient ambulates >300' with prosthesis &  LRAD modified independent Baseline: SEE OBJECTIVE DATA Goal status: ongoing 08/03/2022   Patient negotiates ramps, curbs & stairs with single rail with prosthesis & LRAD modified independent. Baseline: SEE OBJECTIVE DATA Goal status: ongoing 08/03/22  PLAN: PT FREQUENCY: 2x/week   PT DURATION: 12 weeks  PLANNED INTERVENTIONS: Therapeutic exercises, Therapeutic activity, Neuromuscular re-education, Balance training, Gait training, Patient/Family education, Self Care, Stair training, Vestibular training, Canalith repositioning, Prosthetic training, DME instructions, Re-evaluation, and physical performance testing    PLAN FOR NEXT SESSION:  work on picking up items from floor with RUE on RW reaching with LUE, Continue to progress standing balance and prosthetic gait with rollator   Jamey Reas, PT, DPT 09/08/2022, 11:33 AM

## 2022-09-12 ENCOUNTER — Ambulatory Visit (INDEPENDENT_AMBULATORY_CARE_PROVIDER_SITE_OTHER): Payer: Medicare Other | Admitting: Physical Therapy

## 2022-09-12 ENCOUNTER — Encounter: Payer: Self-pay | Admitting: Physical Therapy

## 2022-09-12 DIAGNOSIS — M6281 Muscle weakness (generalized): Secondary | ICD-10-CM

## 2022-09-12 DIAGNOSIS — R293 Abnormal posture: Secondary | ICD-10-CM

## 2022-09-12 DIAGNOSIS — R2689 Other abnormalities of gait and mobility: Secondary | ICD-10-CM

## 2022-09-12 DIAGNOSIS — R2681 Unsteadiness on feet: Secondary | ICD-10-CM | POA: Diagnosis not present

## 2022-09-12 NOTE — Therapy (Signed)
OUTPATIENT PHYSICAL THERAPY TREATMENT NOTE   Patient Name: Shane [name] MRN: [id number] DOB:[birth year], 81 y.o., male Today's Date: 09/12/2022  PCP: Burnard Bunting, MD REFERRING PROVIDER: [name], MD    END OF SESSION:   PT End of Session - 09/12/22 0807     Visit Number 34    Number of Visits 78    Date for PT Re-Evaluation 10/26/22    Authorization Type UHC Medicare    Authorization Time Period [facility] MEDICARE  $20 COPAY    Progress Note Due on Visit 43    PT Start Time 0803    PT Stop Time 0846    PT Time Calculation (min) 43 min    Equipment Utilized During Treatment Gait belt    Activity Tolerance Patient tolerated treatment well;Patient limited by fatigue    Behavior During Therapy WFL for tasks assessed/performed                 Past Medical History:  Diagnosis Date   Anxiety    AV block, Mobitz 1    Cataract    Chronic kidney disease    d/t DM   CKD (chronic kidney disease), stage III ([place])    Depression    Diabetes mellitus    Vgo disposal insulin bolus  simular to insulin pump   Dyspnea    GERD (gastroesophageal reflux disease)    History of kidney stones    passed   Hyperlipidemia    Hypertension    Idiopathic pulmonary fibrosis ([place]) 11/2016   ILD (interstitial lung disease) ([place])    Moderate aortic stenosis    a. 10/2019 Echo: EF 55-60%, Gr2 DD. Nl RV.    Neuromuscular disorder ([place])    Neuropathy associated with endocrine disorder ([place])    Nonobstructive CAD (coronary artery disease)    a. 2012 Cath: mod, nonobs dzs; b. 10/2016 MV: EF 60%, no ischemia.   OSA on CPAP 05/05/2017   Unattended Home Sleep Test 7/2/813-AHI 38.6/hour, desaturation to 64%, body weight 261 pounds   PONV (postoperative nausea and vomiting)    Sleep apnea     uses cpap asked to bring mask and tubing   Past Surgical History:  Procedure Laterality Date   ABDOMINAL AORTOGRAM W/LOWER EXTREMITY N/A 12/10/2020   Procedure: ABDOMINAL AORTOGRAM W/LOWER  EXTREMITY;  Surgeon: Cherre Robins, MD;  Location: [place] [facility];  Service: Cardiovascular;  Laterality: N/A;   AMPUTATION Right 01/22/2021   Procedure: 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;  Surgeon: [name], MD;  Location: [place];  Service: Orthopedics;  Laterality: Right;   AMPUTATION Right 03/17/2021   Procedure: RIGHT BELOW KNEE AMPUTATION;  Surgeon: [name], MD;  Location: [place];  Service: Orthopedics;  Laterality: Right;   ANKLE FUSION Right 01/22/2021   Procedure: RIGHT FOOT TIBIOCALCANEAL FUSION;  Surgeon: [name], MD;  Location: [place];  Service: Orthopedics;  Laterality: Right;   ANTERIOR FUSION CERVICAL SPINE  2012   CARDIAC CATHETERIZATION  2011   CARDIAC CATHETERIZATION N/A 11/09/2016   Procedure: Right Heart Cath;  Surgeon: Belva Crome, MD;  Location: [facility];  Service: Cardiovascular;  Laterality: N/A;   carpel tunnel     left wrist   CATARACT EXTRACTION     CATARACT EXTRACTION W/ INTRAOCULAR LENS  IMPLANT, BILATERAL  2013   CERVICAL LAMINECTOMY  2012   COLONOSCOPY N/A 01/14/2013   Procedure: COLONOSCOPY;  Surgeon: Irene Shipper, MD;  Location: [facility];  Service: Endoscopy;  Laterality: N/A;   CORONARY ARTERY BYPASS GRAFT  11/04/2020   LIMA-LAD, SVG-OM1, SVG-PDA (Dr Marney [facility]) dc 11/18/2020   EYE SURGERY     I & D EXTREMITY Right 02/19/2021   Procedure: RIGHT ANKLE DEBRIDEMENT AND PLACEMENT ANTIBIOTIC BEADS;  Surgeon: [name], MD;  Location: [place];  Service: Orthopedics;  Laterality: Right;   KNEE SURGERY  1998   left   LEFT HEART CATH AND CORONARY ANGIOGRAPHY N/A 07/10/2020   Procedure: LEFT HEART CATH AND CORONARY ANGIOGRAPHY;  Surgeon: Sherren Mocha, MD;  Location: [place] [facility];  Service: Cardiovascular;  Laterality: N/A;   LUMBAR LAMINECTOMY  2003   LUNG BIOPSY Left 12/26/2016   Procedure: LUNG BIOPSY;  Surgeon: [name], MD;  Location: [place];  Service: Thoracic;  Laterality: Left;   PACEMAKER IMPLANT N/A  03/30/2020   Procedure: PACEMAKER IMPLANT;  Surgeon: Evans Lance, MD;  Location: [place];  Service: Cardiovascular;  Laterality: N/A;   PERIPHERAL VASCULAR INTERVENTION Right 12/10/2020   Procedure: PERIPHERAL VASCULAR INTERVENTION;  Surgeon: Cherre Robins, MD;  Location: [facility];  Service: Cardiovascular;  Laterality: Right;  SFA   POSTERIOR FUSION CERVICAL SPINE  2012   PPM GENERATOR REMOVAL N/A 12/14/2020   Procedure: PPM GENERATOR REMOVAL;  Surgeon: Evans Lance, MD;  Location: [facility];  Service: Cardiovascular;  Laterality: N/A;   TEE WITHOUT CARDIOVERSION N/A 12/11/2020   Procedure: TRANSESOPHAGEAL ECHOCARDIOGRAM (TEE);  Surgeon: Geralynn Rile, MD;  Location: [place];  Service: Cardiovascular;  Laterality: N/A;   TRIGGER FINGER RELEASE  2011   4th finger left hand   VIDEO ASSISTED THORACOSCOPY Left 12/26/2016   Procedure: VIDEO ASSISTED THORACOSCOPY;  Surgeon: [name], MD;  Location: [place];  Service: Thoracic;  Laterality: Left;   VIDEO BRONCHOSCOPY N/A 12/26/2016   Procedure: VIDEO BRONCHOSCOPY;  Surgeon: [name], MD;  Location: [place];  Service: Thoracic;  Laterality: N/A;   Patient Active Problem List   Diagnosis Date Noted   Intermediate stage nonexudative age-related macular degeneration of both eyes 04/25/2022   Research study patient 12/24/2021   Post-operative pain    CKD (chronic kidney disease), stage II    Labile blood glucose    Pulmonary fibrosis ([place])    Hyponatremia    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[place]) 03/26/2021   S/P BKA (below knee amputation) unilateral, right ([place])    Shortness of breath    Acute blood loss anemia    Stage 3 chronic kidney disease ([place])    Atrial fibrillation ([place])    Controlled type 2 diabetes mellitus with hyperglycemia, with long-term current use of insulin (H[facility])    Postoperative pain    Postoperative hemorrhagic shock 03/20/2021   Atherosclerosis of native arteries of  extremities with gangrene, right leg ([place])    Hardware complicating wound infection ([place])    Abscess of bursa of right ankle 02/15/2021   Endocarditis 01/19/2021   Peripheral vascular disease ([place]) 12/29/2020   CHF (congestive heart failure), NYHA class II, chronic, diastolic ([place]) 16/09/9603   History of COVID-19 12/22/2020   SSS (sick sinus syndrome) ([place]) 12/22/2020   Acute bacterial endocarditis    MSSA bacteremia 12/09/2020   Diabetic foot ulcer ([place]) 12/09/2020   Foot drop, right foot    Generalized weakness 12/08/2020   Dehydration with hyponatremia 12/08/2020   Atrial fibrillation, chronic (HCC) 12/08/2020   Elevated troponin level not due myocardial infarction 12/08/2020   Adrenal insufficiency ([place])  11/14/2020   Orthostatic hypotension 11/14/2020   Physical deconditioning 11/14/2020   Difficulty sleeping 11/07/2020   Postoperative anemia due to acute blood loss 11/07/2020   Right ventricular dysfunction 11/05/2020   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/04/2020   Hearing loss 11/03/2020   Cardiac device in situ 09/09/2020   Coronary atherosclerosis due to lipid rich plaque 09/02/2020   COVID-19 virus infection 07/18/2020   Coronary artery disease involving native heart without angina pectoris 07/10/2020   Chronic kidney disease, stage 3a ([place]) 03/29/2020   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/28/2020   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 ([place]) 03/16/2020   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 ([place]) 03/16/2020   Right epiretinal membrane 03/16/2020   Posterior vitreous detachment of right eye 03/16/2020   Aortic stenosis, moderate 03/12/2020   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   Near syncope 04/27/2018   Hypoglycemia due to insulin 01/06/2018   Essential hypertension 01/06/2018   Diabetes mellitus type 2, with complication, on long term insulin pump ([place]) 01/06/2018    Syncope and collapse 01/05/2018   Encounter for therapeutic drug monitoring 06/29/2017   OSA on CPAP 05/05/2017   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[place]) 01/05/2017   Abnormal chest x-ray 10/12/2016   Benign neoplasm of colon 01/14/2013    REFERRING DIAG: Z89.511 (ICD-10-CM) - Hx of right BKA   ONSET DATE: 05/04/2022 MD visit with release to WB on LLE  THERAPY DIAG:  Unsteadiness on feet  Other abnormalities of gait and mobility  Muscle weakness (generalized)  Abnormal posture  Rationale for Evaluation and Treatment Rehabilitation  PERTINENT HISTORY: CKD st III, AV block Mobitz, pacemaker, pulmonary fibrosis, CAD, CABG 21, HTN, A-Fib, DM2, PAD, right BKA, Left TMA  PRECAUTIONS: ICD/Pacemaker - continue monitoring vitals with activity and calculated HRmax is 151 bpm  SUBJECTIVE:  He went to high school reunion.  He has not walked much. He tested negative for Covid but he feels he may have had it as he has lost smell & taste along with ShOB   PAIN:    Are you having pain? Yes: NPRS scale: today  0/10, over last week lowest 0/10  Pain location: left shoulder lateral just distal to joint Pain description: tingling when shoulder in improper posture. Aggravating factors: improper posture. Relieving factors: correcting posture   OBJECTIVE: (objective measures completed at initial evaluation unless otherwise dated)  COGNITION: Overall cognitive status: Within functional limits for tasks assessed   POSTURE: 05/09/2022:  rounded shoulders, forward head, and flexed trunk    LOWER EXTREMITY ROM:    UPPER EXTREMITY ROM:  ROM P:passive  A:active Left 06/23/22 Supine all limited by pain Left 08/01/22 Seated All painfree Right 08/01/22 Seated  Shoulder flexion 30* A: 148 A: 153  Shoulder extension 0* neutral A: 72 A: 74  Shoulder abduction 45* A: 135 A: 141  Shoulder adduction     Shoulder internal rotation 20* A: 84 A: 89  Shoulder external rotation 10* A: 88 A: 83   Elbow flexion     Elbow extension -15*     (Blank rows = not tested)    ROM P:passive  A:active Right Eval 05/09/2022 Left Eval 05/09/2022 Bilateral 08/03/22  Hip flexion       Hip extension A: -25* standing A: -25* standing A: -15*  RW support standing  Hip abduction       Hip adduction  Hip internal rotation       Hip external rotation       Knee flexion       Knee extension       Ankle dorsiflexion NA P: -17*   Ankle plantarflexion NA     Ankle inversion NA     Ankle eversion NA      (Blank rows = not tested)   LOWER EXTREMITY MMT:   MMT Right Eval 05/09/2022 Left Eval 05/09/2022  Hip flexion      Hip extension Functional testing 3-/5 Functional testing 3-/5  Hip abduction Functional testing 3-/5 Functional testing 3-/5  Hip adduction      Hip internal rotation      Hip external rotation      Knee flexion Functional testing 3-/5 Functional testing 3-/5  Knee extension Functional testing 3/5 Functional testing 3/5  Ankle dorsiflexion      Ankle plantarflexion      Ankle inversion      Ankle eversion      (Blank rows = not tested)   POSTURE:  06/23/2022  seated - head forward, rounded shoulders and posterior pelvic tilt  TRANSFERS: 05/09/2022:  Sit to stand: SBA and requires use of armrests from 20" stable w/c & needs RW for stabilization 05/09/2022:  Stand to sit: SBA and requires use of armrests from 20" stable w/c & needs RW for stabilization   GAIT: 08/11/2022: Pt amb 100' with rollator, right TTA prosthesis & left AFO partial shoe filler with SBA. He demonstrates increased step length bil., intermittent impaired foot clearance on RLE  08/03/2022: 2-minute Walk Test with RW, right TTA prosthesis & left AFO partial shoe filler. Resting pre HR 70 SpO2 100% HR after activity HR 52  SpO2 100% light amount of light headness Distance 158' with supervision.   Gait pattern: step-to pattern, but distance improved since evaluation, Right hip hike, trunk flexed, and  normal 4" BOS  Pt neg ramp & curb with RW, ight TTA prosthesis & left AFO partial shoe filler with supervision. He required seated rest half way due to fatigue.    05/09/2022:  Gait pattern: step to pattern, decreased step length- Left, decreased stance time- Right, Right hip hike, antalgic, lateral hip instability, trunk flexed, and wide BOS Distance walked: 25' Assistive device utilized: Environmental consultant - 2 wheeled and TTA prosthesis RLE Level of assistance: Min A Comments: pt reports dizziness with turning in gait   UE FUNCTION: 06/23/2022:  Pt reports that he uses RUE for fine motor like writing but uses both hands for gross motor or sports.  Seated - pt unable to raise LUE in flexion or abd due to pain.  Supine - pt unable to move LUE in flexion or abd due to pain.  PROM flexion > pain than abd   Pt tolerates elbow ext with palm supinated but with palm neutral causes shoulder pain. Also painful with elbow already extended and pronate palm.   PT simulated pushing on RW thru LUE with manual resistance.  Pt reports slight increase in pain but not as sharp as above function.    FUNCTIONAL TESTs:  Merrilee Jansky Balance Scale: 08/03/2022:  13/56 & tasks of Berg with RW support 45/56     Berg Balance Scale: 05/09/2022:  9/56 & tasks of Berg with RW support 27/56    [facility] PT Assessment - 05/09/22 0930                Berg Balance Test    Sit  to Stand Needs moderate or maximal assist to stand   task with RW support = 3    Standing Unsupported Unable to stand 30 seconds unassisted   task with RW support = 3    Sitting with Back Unsupported but Feet Supported on Floor or Stool Able to sit safely and securely 2 minutes     Stand to Sit Uses backs of legs against chair to control descent   task with RW support = 3    Transfers Able to transfer safely, definite need of hands     Standing Unsupported with Eyes Closed Needs help to keep from falling   task with RW support = 3    Standing Unsupported with Feet  Together Needs help to attain position and unable to hold for 15 seconds   task with RW support = 2    From Standing, Reach Forward with Outstretched Arm Loses balance while trying/requires external support   task with RW support = 1    From Standing Position, Pick up Object from Floor Unable to try/needs assist to keep balance   task with RW support = 2    From Standing Position, Turn to Look Behind Over each Shoulder Needs assist to keep from losing balance and falling   task with RW support = 1    Turn 360 Degrees Needs assistance while turning   task with RW support = 0    Standing Unsupported, Alternately Place Feet on Step/Stool Needs assistance to keep from falling or unable to try   task with RW support = 0    Standing Unsupported, One Foot in [employer] balance while stepping or standing   task with RW support = 1    Standing on One Leg Unable to try or needs assist to prevent fall   task with RW support = 1    Total Score 9     Berg comment: tasks of Berg with RW support 27/56                     CURRENT PROSTHETIC WEAR ASSESSMENT: 08/03/2022: Pt wears both right TTA prosthesis & left AFO partial shoe filler>90% of awake hours.  He has 2 wounds on dorsum of left foot being monitored by PCP.  He is wearing left Vive Wear compression sock below knee on LLE which has helped wounds to heal. Pt is independent in donning & doffing both prosthesis & AFO. Skin check, cleaning and minor cues on adjusting ply socks.   05/09/2022:   Patient is dependent with: skin check, residual limb care, care of non-amputated limb, prosthetic cleaning, ply sock cleaning, and correct ply sock adjustment Donning prosthesis: Modified independence Doffing prosthesis: Modified independence Prosthetic wear tolerance: >90% of awake hours/day, 7 days/week Prosthetic weight bearing tolerance: 5 minutes Edema: LLE pitting edema with >10 sec capillary refill Residual limb condition: Right BKA limb has no openings,  normal temperature & moisture.  Redness patella & patella tendon from friction of liner with knee flexed. Cylinderical shape.  LLE TMA pt's wife showed picture of wound on dorsum of foot that is being managed by PCP.   Prosthetic description: silicon liner with pin lock suspension, total contact socket, dynamic response foot.      TODAY'S TREATMENT:  09/12/2022 Neuromuscular Re-ed: Standing with back of hands on //bars to minimize UE weight bearing:  Eyes open then eyes closed ea direction 5 reps - right/left, up/down & 2 diagonals;  standing on foam with  light UE support EO 4 head directions 5 reps. Stepping onto foam mat for stance and tapping cone anterior to foam mat with contralateral LE for 10 reps ea LE with BUE support Stepping onto foam mat for stance and tapping cone across midline for hip adduction to foam mat with contralateral LE for 10 reps ea LE with BUE support Stepping onto foam mat for stance and tapping cone anterior-lateral for hip abduction to foam mat with contralateral LE for 10 reps ea LE with BUE support Sit to/from stand without UE assist from 24" stool with minA/tactile cues 5 reps Backwards gait in //bars with PT cues on technique 8' X 2 Side stepping right & left in //bars with PT cues on technique 8' X 2  Therapeutic Activities: Pt able to pick up item from floor with RUE support on RW reaching with LUE:  1st & 3rd rep with RUE on RW armrest with minA & 2nd rep on mid bar of RW with min guard.  Pt verbalizes easier with hand on mid bar. PT reviewed short (household with goal frequency), long (max tolerable) and medium distance.  PT acknowledged that distances & frequency may be reduced with illnesses like congestion but he still needs to try to stay active throughout awake hours.  Being sedentary will lead to deconditioning.  PT recommended trying to keep prosthesis & AFO on limbs until going to bed for the night so he can ambulate to bathroom & reduce fall risk.  Pt &  wife verbalized understanding.   09/08/2022 Neuromuscular Re-ed: Standing with back of hands on //bars to minimize UE weight bearing:  Eyes open then eyes closed ea direction 5 reps - right/left, up/down & 2 diagonals;  standing on foam with light UE support EO 4 head directions 5 reps. Stepping onto foam mat for stance and tapping cone anterior to foam mat with contralateral LE for 10 reps ea LE with BUE support Stepping onto foam mat for stance and tapping cone anterior-lateral for hip abduction to foam mat with contralateral LE for 10 reps ea LE with BUE support Sit to/from stand without UE assist from 24" stool with minA/tactile cues 5 reps Quarter turns CW & CCW with BUE //bars with PT demo & cues on technique.  Therapeutic Activities: PT demo & verbal cues on technique to pick up items off floor with bil. Ankles stiff (AFO & prosthesis) with single UE support.  Pt performed with RLE & LLE in forward position & reached with RUE & LUE.  Pt unable to reach floor reaching with RUE with either foot position.  He was more stable & reported more comfortable with LLE forward position reaching with LUE (RUE on //bar).  Pt performed all reaching with PT min guard & support for safety.    09/06/22 Prosthetic Training with right TTA prosthesis & left dynamic AFO and custom shoe with toe filler and carbon plate: Monitored SPO2 and HR during session after bouts or activity due to [facility] ranges were SpO2 93-99%  and HR 60-77  Pt ambulated 155' in with RW as max tolerable distance. Then another 75' with RW at end of session Rocker board in // bars with UE support X 20 A-P and X 20 lateral Balance on airex pad with feet normal width apart 30 sec X 3 Sidestepping in //bar 3 round trips Step ups in //bars with bilat UE support on 6 inch step X 5 each side    HOME EXERCISE PROGRAM  Access Code: [email or web address]  URL: [email or web address] Date: 07/05/2022 Prepared by: Jamey Reas  Exercises -  Seated Correct Posture  - 1 x daily - 7 x weekly - Seated Scapular Retraction  - 2-5 x daily - 7 x weekly - 1 sets - 10 reps - 5 seconds hold - Supine Chin Tucks on Flat Ball  - 2-5 x daily - 7 x weekly - 1 sets - 10 reps - 5 seconds hold - Seated Isometric Cervical Retraction with Chin Tucks with Pillow behind head  - 2-5 x daily - 7 x weekly - 1 sets - 10 reps - 5 seconds hold - Circular Shoulder Pendulum with Table Support  - 1-2 x daily - 7 x weekly - 1-2 sets - 10 reps - 5 seconds hold - Flexion-Extension Shoulder Pendulum with Table Support  - 1-2 x daily - 7 x weekly - 1-2 sets - 10 reps - Horizontal Shoulder Pendulum with Table Support  - 1-2 x daily - 7 x weekly - 1-2 sets - 10 reps - Supine Shoulder Flexion Extension AAROM with Dowel  - 1 x daily - 7 x weekly - 1-2 sets - 10 reps - 5 seconds hold - Supine Shoulder Abduction AAROM with Dowel  - 1 x daily - 7 x weekly - 1-2 sets - 10 reps - 5 seconds hold - Supine Shoulder External Rotation with Dowel  - 1 x daily - 7 x weekly - 1-2 sets - 10 reps - 5 seconds hold   Neuromuscular Reed-  cues on proprioception RLE using residual limb, balance & standing tolerance.   Do each exercise 1-2  times per day Do each exercise 5-10 repetitions Hold each exercise for 2 seconds to feel your location  AT [place].  Try to find this position when standing still for activities.   USE TAPE ON FLOOR TO MARK THE MIDLINE POSITION which is even with middle of sink.  You also should try to feel with your limb pressure in socket.  You are trying to feel with limb what you used to feel with the bottom of your foot.  Side to Side Shift: Moving your hips only (not shoulders): move weight onto your left leg, HOLD/FEEL pressure in socket.  Move back to equal weight on each leg, HOLD/FEEL pressure in socket. Move weight onto your right leg, HOLD/FEEL pressure in socket. Move back to equal  weight on each leg, HOLD/FEEL pressure in socket. Repeat.  Start with both hands on sink, progress to hand on prosthetic side only, then no hands.  Front to Back Shift: Moving your hips only (not shoulders): move your weight forward onto your toes, HOLD/FEEL pressure in socket. Move your weight back to equal Flat Foot on both legs, HOLD/FEEL  pressure in socket. Move your weight back onto your heels, HOLD/FEEL  pressure in socket. Move your weight back to equal on both legs, HOLD/FEEL  pressure in socket. Repeat.  Start with both hands on sink, progress to hand on prosthetic side only, then no hands.  Moving Cones / Cups: With equal weight on each leg: Hold on with one hand the first time, then progress to no hand supports. Move cups from one side of sink to the other. Place cups ~2" out of your reach, progress to 10" beyond reach.  Place one hand in middle of sink and reach with other hand. Do both arms.  Then hover one hand and move cups with other  hand.  Overhead/Upward Reaching: alternated reaching up to top cabinets or ceiling if no cabinets present. Keep equal weight on each leg. Start with one hand support on counter while other hand reaches and progress to no hand support with reaching.  ace one hand in middle of sink and reach with other hand. Do both arms.  Then hover one hand and move cups with other hand.  5.   Looking Over Shoulders: With equal weight on each leg: alternate turning to look over your shoulders with one hand support on counter as needed.  Start with head motions only to look in front of shoulder, then even with shoulder and progress to looking behind you. To look to side, move head /eyes, then shoulder on side looking pulls back, shift more weight to side looking and pull hip back. Place one hand in middle of sink and let go with other hand so your shoulder can pull back. Switch hands to look other way.   Then hover one hand and look over shoulder. If looking right, use left hand at  sink. If looking left, use right hand at sink. 6.  Alternate Tapping Toes or Stepping to lower shelf of cabinet  Move items under cabinet out of your way. Shift your hips/pelvis so weight on stance leg & Tighten muscles in hip.  SLOWLY step other leg so front of foot is in cabinet. Then step back to floor. Repeat with other leg.     ASSESSMENT:   CLINICAL IMPRESSION: Pt & wife appear to understand PT recommendations for activity level with modification when congested or other issues that are effecting his breathing.  His balance is improving including picking up items from floor, walking backwards & sideways.  Pt continues to benefit from skilled PT.   OBJECTIVE IMPAIRMENTS .Abnormal gait, cardiopulmonary status limiting activity, decreased activity tolerance, decreased balance, decreased endurance, decreased knowledge of use of DME, decreased mobility, decreased ROM, decreased strength, dizziness, increased edema, impaired flexibility, postural dysfunction, prosthetic dependency , obesity, and pain.    ACTIVITY LIMITATIONS carrying, lifting, bending, standing, stairs, transfers, locomotion level, UE use with ADLs, sleep    PARTICIPATION LIMITATIONS: driving, community activity, and yard work, UE use with ADLs &  household activities   [place], Time since onset of injury/illness/exacerbation, and 3+ comorbidities: see PMH  are also affecting patient's functional outcome.    REHAB POTENTIAL: Good   CLINICAL DECISION MAKING: Evolving/moderate complexity   EVALUATION COMPLEXITY: Moderate     GOALS: Goals reviewed with patient? Yes  SHORT TERM GOALS: Target date: 09/29/2022  1. Patient able to pick up items from floor with single UE support on walker or surface. Baseline: SEE OBJECTIVE DATA Goal status: set 09/08/2022  2. Patient reports ambulating in home & entering/exiting home >90% of time over w/c use.  Baseline: SEE OBJECTIVE DATA Goal status: set 09/08/2022  3.  Patient ambulates up to 200' with RW, prosthesis & AFO with supervision.  Goal status: ongoing 09/01/2022     UPDATED LONG TERM GOALS: Target date: 10/26/2022   Patient demonstrates & verbalized understanding of prosthetic care to enable safe utilization of prosthesis. Baseline: SEE OBJECTIVE DATA Goal status: ongoing 08/03/22    Patient tolerates right prosthesis & left AFO / partial foot prosthesis wear >90% of awake hours without skin or limb pain issues. Baseline: SEE OBJECTIVE DATA Goal status: ongoing 08/03/22    Berg Balance >/= 20/56 and tasks of Berg with RW support >/= 52/56 to indicate lower fall risk  with standing ADLs Baseline: SEE OBJECTIVE DATA Goal status: 08/03/2022 updated    Patient ambulates >300' with prosthesis & LRAD modified independent Baseline: SEE OBJECTIVE DATA Goal status: ongoing 08/03/2022   Patient negotiates ramps, curbs & stairs with single rail with prosthesis & LRAD modified independent. Baseline: SEE OBJECTIVE DATA Goal status: ongoing 08/03/22  PLAN: PT FREQUENCY: 2x/week   PT DURATION: 12 weeks  PLANNED INTERVENTIONS: Therapeutic exercises, Therapeutic activity, Neuromuscular re-education, Balance training, Gait training, Patient/Family education, Self Care, Stair training, Vestibular training, Canalith repositioning, Prosthetic training, DME instructions, Re-evaluation, and physical performance testing    PLAN FOR NEXT SESSION:  continue to  work on picking up items from floor with RUE on RW mid bar & reaching with LUE, Continue to progress standing balance and prosthetic gait with rollator   Jamey Reas, PT, DPT 09/12/2022, 10:12 AM

## 2022-09-16 ENCOUNTER — Ambulatory Visit (INDEPENDENT_AMBULATORY_CARE_PROVIDER_SITE_OTHER): Payer: Medicare Other | Admitting: Physical Therapy

## 2022-09-16 ENCOUNTER — Encounter: Payer: Self-pay | Admitting: Physical Therapy

## 2022-09-16 DIAGNOSIS — R2689 Other abnormalities of gait and mobility: Secondary | ICD-10-CM | POA: Diagnosis not present

## 2022-09-16 DIAGNOSIS — R2681 Unsteadiness on feet: Secondary | ICD-10-CM

## 2022-09-16 DIAGNOSIS — M6281 Muscle weakness (generalized): Secondary | ICD-10-CM | POA: Diagnosis not present

## 2022-09-16 DIAGNOSIS — R293 Abnormal posture: Secondary | ICD-10-CM

## 2022-09-16 DIAGNOSIS — Z9181 History of falling: Secondary | ICD-10-CM

## 2022-09-16 NOTE — Therapy (Signed)
OUTPATIENT PHYSICAL THERAPY TREATMENT NOTE   Patient Name: Shane [name] MRN: [id number] DOB:[birth year], 81 y.o., male Today's Date: 09/16/2022  PCP: Burnard Bunting, MD REFERRING PROVIDER: [name], MD    END OF SESSION:   PT End of Session - 09/16/22 0801     Visit Number 35    Number of Visits 12    Date for PT Re-Evaluation 10/26/22    Authorization Type UHC Medicare    Authorization Time Period UHC MEDICARE  $20 COPAY    Progress Note Due on Visit 70    PT Start Time 0800    PT Stop Time 0840    PT Time Calculation (min) 40 min    Equipment Utilized During Treatment Gait belt    Activity Tolerance Patient tolerated treatment well;Patient limited by fatigue    Behavior During Therapy WFL for tasks assessed/performed                 Past Medical History:  Diagnosis Date   Anxiety    AV block, Mobitz 1    Cataract    Chronic kidney disease    d/t DM   CKD (chronic kidney disease), stage III ([place])    Depression    Diabetes mellitus    Vgo disposal insulin bolus  simular to insulin pump   Dyspnea    GERD (gastroesophageal reflux disease)    History of kidney stones    passed   Hyperlipidemia    Hypertension    Idiopathic pulmonary fibrosis ([place]) 11/2016   ILD (interstitial lung disease) ([place])    Moderate aortic stenosis    a. 10/2019 Echo: EF 55-60%, Gr2 DD. Nl RV.    Neuromuscular disorder ([place])    Neuropathy associated with endocrine disorder ([place])    Nonobstructive CAD (coronary artery disease)    a. 2012 Cath: mod, nonobs dzs; b. 10/2016 MV: EF 60%, no ischemia.   OSA on CPAP 05/05/2017   Unattended Home Sleep Test 7/2/813-AHI 38.6/hour, desaturation to 64%, body weight 261 pounds   PONV (postoperative nausea and vomiting)    Sleep apnea     uses cpap asked to bring mask and tubing   Past Surgical History:  Procedure Laterality Date   ABDOMINAL AORTOGRAM W/LOWER EXTREMITY N/A 12/10/2020   Procedure: ABDOMINAL AORTOGRAM W/LOWER  EXTREMITY;  Surgeon: Cherre Robins, MD;  Location: [facility];  Service: Cardiovascular;  Laterality: N/A;   AMPUTATION Right 01/22/2021   Procedure: 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;  Surgeon: [name], MD;  Location: [facility];  Service: Orthopedics;  Laterality: Right;   AMPUTATION Right 03/17/2021   Procedure: RIGHT BELOW KNEE AMPUTATION;  Surgeon: [name], MD;  Location: [facility];  Service: Orthopedics;  Laterality: Right;   ANKLE FUSION Right 01/22/2021   Procedure: RIGHT FOOT TIBIOCALCANEAL FUSION;  Surgeon: [name], MD;  Location: [facility];  Service: Orthopedics;  Laterality: Right;   ANTERIOR FUSION CERVICAL SPINE  2012   CARDIAC CATHETERIZATION  2011   CARDIAC CATHETERIZATION N/A 11/09/2016   Procedure: Right Heart Cath;  Surgeon: Belva Crome, MD;  Location: [facility];  Service: Cardiovascular;  Laterality: N/A;   carpel tunnel     left wrist   CATARACT EXTRACTION     CATARACT EXTRACTION W/ INTRAOCULAR LENS  IMPLANT, BILATERAL  2013   CERVICAL LAMINECTOMY  2012   COLONOSCOPY N/A 01/14/2013   Procedure: COLONOSCOPY;  Surgeon: Irene Shipper, MD;  Location: [facility];  Service: Endoscopy;  Laterality: N/A;   CORONARY ARTERY BYPASS GRAFT  11/04/2020   LIMA-LAD, SVG-OM1, SVG-PDA (Dr Marney [facility]) dc 11/18/2020   EYE SURGERY     I & D EXTREMITY Right 02/19/2021   Procedure: RIGHT ANKLE DEBRIDEMENT AND PLACEMENT ANTIBIOTIC BEADS;  Surgeon: [name], MD;  Location: [facility];  Service: Orthopedics;  Laterality: Right;   KNEE SURGERY  1998   left   LEFT HEART CATH AND CORONARY ANGIOGRAPHY N/A 07/10/2020   Procedure: LEFT HEART CATH AND CORONARY ANGIOGRAPHY;  Surgeon: Sherren Mocha, MD;  Location: [facility];  Service: Cardiovascular;  Laterality: N/A;   LUMBAR LAMINECTOMY  2003   LUNG BIOPSY Left 12/26/2016   Procedure: LUNG BIOPSY;  Surgeon: [name], MD;  Location: [facility];  Service: Thoracic;  Laterality: Left;   PACEMAKER IMPLANT N/A  03/30/2020   Procedure: PACEMAKER IMPLANT;  Surgeon: Evans Lance, MD;  Location: [facility];  Service: Cardiovascular;  Laterality: N/A;   PERIPHERAL VASCULAR INTERVENTION Right 12/10/2020   Procedure: PERIPHERAL VASCULAR INTERVENTION;  Surgeon: Cherre Robins, MD;  Location: [facility];  Service: Cardiovascular;  Laterality: Right;  SFA   POSTERIOR FUSION CERVICAL SPINE  2012   PPM GENERATOR REMOVAL N/A 12/14/2020   Procedure: PPM GENERATOR REMOVAL;  Surgeon: Evans Lance, MD;  Location: [facility];  Service: Cardiovascular;  Laterality: N/A;   TEE WITHOUT CARDIOVERSION N/A 12/11/2020   Procedure: TRANSESOPHAGEAL ECHOCARDIOGRAM (TEE);  Surgeon: Geralynn Rile, MD;  Location: [facility];  Service: Cardiovascular;  Laterality: N/A;   TRIGGER FINGER RELEASE  2011   4th finger left hand   VIDEO ASSISTED THORACOSCOPY Left 12/26/2016   Procedure: VIDEO ASSISTED THORACOSCOPY;  Surgeon: [name], MD;  Location: [facility];  Service: Thoracic;  Laterality: Left;   VIDEO BRONCHOSCOPY N/A 12/26/2016   Procedure: VIDEO BRONCHOSCOPY;  Surgeon: [name], MD;  Location: [facility];  Service: Thoracic;  Laterality: N/A;   Patient Active Problem List   Diagnosis Date Noted   Intermediate stage nonexudative age-related macular degeneration of both eyes 04/25/2022   Research study patient 12/24/2021   Post-operative pain    CKD (chronic kidney disease), stage II    Labile blood glucose    Pulmonary fibrosis ([place])    Hyponatremia    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)    Abscess of bursa of right ankle 02/15/2021   Endocarditis 01/19/2021   Peripheral vascular disease ([place]) 12/29/2020   CHF (congestive heart failure), NYHA class II, chronic, diastolic ([place]) 29/93/7169   History of COVID-19 12/22/2020   SSS (sick sinus syndrome) ([place]) 12/22/2020   Acute bacterial endocarditis    MSSA bacteremia 12/09/2020   Diabetic foot ulcer ([place]) 12/09/2020   Foot drop, right foot    Generalized weakness 12/08/2020   Dehydration with hyponatremia 12/08/2020   Atrial fibrillation, chronic (HCC) 12/08/2020   Elevated troponin level not due myocardial infarction 12/08/2020   Adrenal insufficiency ([place])  11/14/2020   Orthostatic hypotension 11/14/2020   Physical deconditioning 11/14/2020   Difficulty sleeping 11/07/2020   Postoperative anemia due to acute blood loss 11/07/2020   Right ventricular dysfunction 11/05/2020   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/04/2020   Hearing loss 11/03/2020   Cardiac device in situ 09/09/2020   Coronary atherosclerosis due to lipid rich plaque 09/02/2020   COVID-19 virus infection 07/18/2020   Coronary artery disease involving native heart without angina pectoris 07/10/2020   Chronic kidney disease, stage 3a ([place]) 03/29/2020   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/28/2020   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 ([place]) 03/16/2020   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 ([place]) 03/16/2020   Right epiretinal membrane 03/16/2020   Posterior vitreous detachment of right eye 03/16/2020   Aortic stenosis, moderate 03/12/2020   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   Near syncope 04/27/2018   Hypoglycemia due to insulin 01/06/2018   Essential hypertension 01/06/2018   Diabetes mellitus type 2, with complication, on long term insulin pump ([place]) 01/06/2018    Syncope and collapse 01/05/2018   Encounter for therapeutic drug monitoring 06/29/2017   OSA on CPAP 05/05/2017   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[place]) 01/05/2017   Abnormal chest x-ray 10/12/2016   Benign neoplasm of colon 01/14/2013    REFERRING DIAG: Z89.511 (ICD-10-CM) - Hx of right BKA   ONSET DATE: 05/04/2022 MD visit with release to WB on LLE  THERAPY DIAG:  Unsteadiness on feet  Other abnormalities of gait and mobility  Muscle weakness (generalized)  Abnormal posture  History of falling  Rationale for Evaluation and Treatment Rehabilitation  PERTINENT HISTORY: CKD st III, AV block Mobitz, pacemaker, pulmonary fibrosis, CAD, CABG 21, HTN, A-Fib, DM2, PAD, right BKA, Left TMA  PRECAUTIONS: ICD/Pacemaker - continue monitoring vitals with activity and calculated HRmax is 151 bpm  SUBJECTIVE:  He denies any pain today, states sill short of breath (SHOB) a little more than usual but feeling better than last time.  PAIN:    Are you having pain? Yes: NPRS scale: today  0/10, over last week lowest 0/10  Pain location: left shoulder lateral just distal to joint Pain description: tingling when shoulder in improper posture. Aggravating factors: improper posture. Relieving factors: correcting posture   OBJECTIVE: (objective measures completed at initial evaluation unless otherwise dated)  COGNITION: Overall cognitive status: Within functional limits for tasks assessed   POSTURE: 05/09/2022:  rounded shoulders, forward head, and flexed trunk    LOWER EXTREMITY ROM:    UPPER EXTREMITY ROM:  ROM P:passive  A:active Left 06/23/22 Supine all limited by pain Left 08/01/22 Seated All painfree Right 08/01/22 Seated  Shoulder flexion 30* A: 148 A: 153  Shoulder extension 0* neutral A: 72 A: 74  Shoulder abduction 45* A: 135 A: 141  Shoulder adduction     Shoulder internal rotation 20* A: 84 A: 89  Shoulder external rotation 10* A: 88 A: 83  Elbow flexion     Elbow  extension -15*     (Blank rows = not tested)    ROM P:passive  A:active Right Eval 05/09/2022 Left Eval 05/09/2022 Bilateral 08/03/22  Hip flexion       Hip extension A: -25* standing A: -25* standing A: -15*  RW support standing  Hip abduction       Hip adduction       Hip internal rotation       Hip  external rotation       Knee flexion       Knee extension       Ankle dorsiflexion NA P: -17*   Ankle plantarflexion NA     Ankle inversion NA     Ankle eversion NA      (Blank rows = not tested)   LOWER EXTREMITY MMT:   MMT Right Eval 05/09/2022 Left Eval 05/09/2022  Hip flexion      Hip extension Functional testing 3-/5 Functional testing 3-/5  Hip abduction Functional testing 3-/5 Functional testing 3-/5  Hip adduction      Hip internal rotation      Hip external rotation      Knee flexion Functional testing 3-/5 Functional testing 3-/5  Knee extension Functional testing 3/5 Functional testing 3/5  Ankle dorsiflexion      Ankle plantarflexion      Ankle inversion      Ankle eversion      (Blank rows = not tested)   POSTURE:  06/23/2022  seated - head forward, rounded shoulders and posterior pelvic tilt  TRANSFERS: 05/09/2022:  Sit to stand: SBA and requires use of armrests from 20" stable w/c & needs RW for stabilization 05/09/2022:  Stand to sit: SBA and requires use of armrests from 20" stable w/c & needs RW for stabilization   GAIT: 08/11/2022: Pt amb 100' with rollator, right TTA prosthesis & left AFO partial shoe filler with SBA. He demonstrates increased step length bil., intermittent impaired foot clearance on RLE  08/03/2022: 2-minute Walk Test with RW, right TTA prosthesis & left AFO partial shoe filler. Resting pre HR 70 SpO2 100% HR after activity HR 52  SpO2 100% light amount of light headness Distance 158' with supervision.   Gait pattern: step-to pattern, but distance improved since evaluation, Right hip hike, trunk flexed, and normal 4" BOS  Pt neg ramp &  curb with RW, ight TTA prosthesis & left AFO partial shoe filler with supervision. He required seated rest half way due to fatigue.    05/09/2022:  Gait pattern: step to pattern, decreased step length- Left, decreased stance time- Right, Right hip hike, antalgic, lateral hip instability, trunk flexed, and wide BOS Distance walked: 25' Assistive device utilized: Environmental consultant - 2 wheeled and TTA prosthesis RLE Level of assistance: Min A Comments: pt reports dizziness with turning in gait   UE FUNCTION: 06/23/2022:  Pt reports that he uses RUE for fine motor like writing but uses both hands for gross motor or sports.  Seated - pt unable to raise LUE in flexion or abd due to pain.  Supine - pt unable to move LUE in flexion or abd due to pain.  PROM flexion > pain than abd   Pt tolerates elbow ext with palm supinated but with palm neutral causes shoulder pain. Also painful with elbow already extended and pronate palm.   PT simulated pushing on RW thru LUE with manual resistance.  Pt reports slight increase in pain but not as sharp as above function.    FUNCTIONAL TESTs:  Merrilee Jansky Balance Scale: 08/03/2022:  13/56 & tasks of Berg with RW support 45/56     Berg Balance Scale: 05/09/2022:  9/56 & tasks of Berg with RW support 27/56    [facility] PT Assessment - 05/09/22 0930                Berg Balance Test    Sit to Stand Needs moderate or maximal assist to stand  task with RW support = 3    Standing Unsupported Unable to stand 30 seconds unassisted   task with RW support = 3    Sitting with Back Unsupported but Feet Supported on Floor or Stool Able to sit safely and securely 2 minutes     Stand to Sit Uses backs of legs against chair to control descent   task with RW support = 3    Transfers Able to transfer safely, definite need of hands     Standing Unsupported with Eyes Closed Needs help to keep from falling   task with RW support = 3    Standing Unsupported with Feet Together Needs help to attain  position and unable to hold for 15 seconds   task with RW support = 2    From Standing, Reach Forward with Outstretched Arm Loses balance while trying/requires external support   task with RW support = 1    From Standing Position, Pick up Object from Floor Unable to try/needs assist to keep balance   task with RW support = 2    From Standing Position, Turn to Look Behind Over each Shoulder Needs assist to keep from losing balance and falling   task with RW support = 1    Turn 360 Degrees Needs assistance while turning   task with RW support = 0    Standing Unsupported, Alternately Place Feet on Step/Stool Needs assistance to keep from falling or unable to try   task with RW support = 0    Standing Unsupported, One Foot in [employer] balance while stepping or standing   task with RW support = 1    Standing on One Leg Unable to try or needs assist to prevent fall   task with RW support = 1    Total Score 9     Berg comment: tasks of Berg with RW support 27/56                     CURRENT PROSTHETIC WEAR ASSESSMENT: 08/03/2022: Pt wears both right TTA prosthesis & left AFO partial shoe filler>90% of awake hours.  He has 2 wounds on dorsum of left foot being monitored by PCP.  He is wearing left Vive Wear compression sock below knee on LLE which has helped wounds to heal. Pt is independent in donning & doffing both prosthesis & AFO. Skin check, cleaning and minor cues on adjusting ply socks.   05/09/2022:   Patient is dependent with: skin check, residual limb care, care of non-amputated limb, prosthetic cleaning, ply sock cleaning, and correct ply sock adjustment Donning prosthesis: Modified independence Doffing prosthesis: Modified independence Prosthetic wear tolerance: >90% of awake hours/day, 7 days/week Prosthetic weight bearing tolerance: 5 minutes Edema: LLE pitting edema with >10 sec capillary refill Residual limb condition: Right BKA limb has no openings, normal temperature &  moisture.  Redness patella & patella tendon from friction of liner with knee flexed. Cylinderical shape.  LLE TMA pt's wife showed picture of wound on dorsum of foot that is being managed by PCP.   Prosthetic description: silicon liner with pin lock suspension, total contact socket, dynamic response foot.      TODAY'S TREATMENT:  09/16/2022 -Ambulation with RW and SBA 75 feet X 2, then 100 feet with longer rest break to catch his breath.  Neuromuscular Re-ed: Standing with back of hands on //bars to minimize UE weight bearing:  head turns and head nods X 15 each- Stepping onto  foam mat for stance and tapping cone anterior to foam mat with contralateral LE for 10 reps ea LE with BUE support Stepping onto foam mat for stance and tapping cone across midline for hip adduction to foam mat with contralateral LE for 10 reps ea LE with BUE support Stepping onto foam mat for stance and tapping cone anterior-lateral for hip abduction to foam mat with contralateral LE for 15 reps ea LE with BUE support Sit to/from stand without UE assist from 24" stool with minA/tactile cues 5 reps Backwards gait in //bars with PT cues on technique 8' X 4 Side stepping right & left in //bars with PT cues on technique 8' X 4  09/12/2022 Neuromuscular Re-ed: Standing with back of hands on //bars to minimize UE weight bearing:  Eyes open then eyes closed ea direction 5 reps - right/left, up/down & 2 diagonals;  standing on foam with light UE support EO 4 head directions 5 reps. Stepping onto foam mat for stance and tapping cone anterior to foam mat with contralateral LE for 10 reps ea LE with BUE support Stepping onto foam mat for stance and tapping cone across midline for hip adduction to foam mat with contralateral LE for 10 reps ea LE with BUE support Stepping onto foam mat for stance and tapping cone anterior-lateral for hip abduction to foam mat with contralateral LE for 10 reps ea LE with BUE support Sit to/from stand  without UE assist from 24" stool with minA/tactile cues 5 reps Backwards gait in //bars with PT cues on technique 8' X 2 Side stepping right & left in //bars with PT cues on technique 8' X 2  Therapeutic Activities: Pt able to pick up item from floor with RUE support on RW reaching with LUE:  1st & 3rd rep with RUE on RW armrest with minA & 2nd rep on mid bar of RW with min guard.  Pt verbalizes easier with hand on mid bar. PT reviewed short (household with goal frequency), long (max tolerable) and medium distance.  PT acknowledged that distances & frequency may be reduced with illnesses like congestion but he still needs to try to stay active throughout awake hours.  Being sedentary will lead to deconditioning.  PT recommended trying to keep prosthesis & AFO on limbs until going to bed for the night so he can ambulate to bathroom & reduce fall risk.  Pt & wife verbalized understanding.   09/08/2022 Neuromuscular Re-ed: Standing with back of hands on //bars to minimize UE weight bearing:  Eyes open then eyes closed ea direction 5 reps - right/left, up/down & 2 diagonals;  standing on foam with light UE support EO 4 head directions 5 reps. Stepping onto foam mat for stance and tapping cone anterior to foam mat with contralateral LE for 10 reps ea LE with BUE support Stepping onto foam mat for stance and tapping cone anterior-lateral for hip abduction to foam mat with contralateral LE for 10 reps ea LE with BUE support Sit to/from stand without UE assist from 24" stool with minA/tactile cues 5 reps Quarter turns CW & CCW with BUE //bars with PT demo & cues on technique.  Therapeutic Activities: PT demo & verbal cues on technique to pick up items off floor with bil. Ankles stiff (AFO & prosthesis) with single UE support.  Pt performed with RLE & LLE in forward position & reached with RUE & LUE.  Pt unable to reach floor reaching with RUE with either foot position.  He was more stable & reported more  comfortable with LLE forward position reaching with LUE (RUE on //bar).  Pt performed all reaching with PT min guard & support for safety.    09/06/22 Prosthetic Training with right TTA prosthesis & left dynamic AFO and custom shoe with toe filler and carbon plate: Monitored SPO2 and HR during session after bouts or activity due to [facility] ranges were SpO2 93-99%  and HR 60-77  Pt ambulated 155' in with RW as max tolerable distance. Then another 66' with RW at end of session Rocker board in // bars with UE support X 20 A-P and X 20 lateral Balance on airex pad with feet normal width apart 30 sec X 3 Sidestepping in //bar 3 round trips Step ups in //bars with bilat UE support on 6 inch step X 5 each side    HOME EXERCISE PROGRAM  Access Code: [email or web address] URL: [email or web address] Date: 07/05/2022 Prepared by: Jamey Reas  Exercises - Seated Correct Posture  - 1 x daily - 7 x weekly - Seated Scapular Retraction  - 2-5 x daily - 7 x weekly - 1 sets - 10 reps - 5 seconds hold - Supine Chin Tucks on Flat Ball  - 2-5 x daily - 7 x weekly - 1 sets - 10 reps - 5 seconds hold - Seated Isometric Cervical Retraction with Chin Tucks with Pillow behind head  - 2-5 x daily - 7 x weekly - 1 sets - 10 reps - 5 seconds hold - Circular Shoulder Pendulum with Table Support  - 1-2 x daily - 7 x weekly - 1-2 sets - 10 reps - 5 seconds hold - Flexion-Extension Shoulder Pendulum with Table Support  - 1-2 x daily - 7 x weekly - 1-2 sets - 10 reps - Horizontal Shoulder Pendulum with Table Support  - 1-2 x daily - 7 x weekly - 1-2 sets - 10 reps - Supine Shoulder Flexion Extension AAROM with Dowel  - 1 x daily - 7 x weekly - 1-2 sets - 10 reps - 5 seconds hold - Supine Shoulder Abduction AAROM with Dowel  - 1 x daily - 7 x weekly - 1-2 sets - 10 reps - 5 seconds hold - Supine Shoulder External Rotation with Dowel  - 1 x daily - 7 x weekly - 1-2 sets - 10 reps - 5 seconds hold   Neuromuscular Reed-   cues on proprioception RLE using residual limb, balance & standing tolerance.   Do each exercise 1-2  times per day Do each exercise 5-10 repetitions Hold each exercise for 2 seconds to feel your location  AT [place].  Try to find this position when standing still for activities.   USE TAPE ON FLOOR TO MARK THE MIDLINE POSITION which is even with middle of sink.  You also should try to feel with your limb pressure in socket.  You are trying to feel with limb what you used to feel with the bottom of your foot.  Side to Side Shift: Moving your hips only (not shoulders): move weight onto your left leg, HOLD/FEEL pressure in socket.  Move back to equal weight on each leg, HOLD/FEEL pressure in socket. Move weight onto your right leg, HOLD/FEEL pressure in socket. Move back to equal weight on each leg, HOLD/FEEL pressure in socket. Repeat.  Start with both hands on sink, progress to hand on prosthetic side only, then no hands.  Front to Back Shift: Moving your hips only (not shoulders): move your weight forward onto your toes, HOLD/FEEL pressure in socket. Move your weight back to equal Flat Foot on both legs, HOLD/FEEL  pressure in socket. Move your weight back onto your heels, HOLD/FEEL  pressure in socket. Move your weight back to equal on both legs, HOLD/FEEL  pressure in socket. Repeat.  Start with both hands on sink, progress to hand on prosthetic side only, then no hands.  Moving Cones / Cups: With equal weight on each leg: Hold on with one hand the first time, then progress to no hand supports. Move cups from one side of sink to the other. Place cups ~2" out of your reach, progress to 10" beyond reach.  Place one hand in middle of sink and reach with other hand. Do both arms.  Then hover one hand and move cups with other hand.  Overhead/Upward Reaching: alternated reaching up to top cabinets or ceiling if no cabinets present.  Keep equal weight on each leg. Start with one hand support on counter while other hand reaches and progress to no hand support with reaching.  ace one hand in middle of sink and reach with other hand. Do both arms.  Then hover one hand and move cups with other hand.  5.   Looking Over Shoulders: With equal weight on each leg: alternate turning to look over your shoulders with one hand support on counter as needed.  Start with head motions only to look in front of shoulder, then even with shoulder and progress to looking behind you. To look to side, move head /eyes, then shoulder on side looking pulls back, shift more weight to side looking and pull hip back. Place one hand in middle of sink and let go with other hand so your shoulder can pull back. Switch hands to look other way.   Then hover one hand and look over shoulder. If looking right, use left hand at sink. If looking left, use right hand at sink. 6.  Alternate Tapping Toes or Stepping to lower shelf of cabinet  Move items under cabinet out of your way. Shift your hips/pelvis so weight on stance leg & Tighten muscles in hip.  SLOWLY step other leg so front of foot is in cabinet. Then step back to floor. Repeat with other leg.     ASSESSMENT:   CLINICAL IMPRESSION: Pt & wife appear to understand PT recommendations for activity level with modification when congested or other issues that are effecting his breathing.  His balance is improving including picking up items from floor, walking backwards & sideways.  Pt continues to benefit from skilled PT.   OBJECTIVE IMPAIRMENTS .Abnormal gait, cardiopulmonary status limiting activity, decreased activity tolerance, decreased balance, decreased endurance, decreased knowledge of use of DME, decreased mobility, decreased ROM, decreased strength, dizziness, increased edema, impaired flexibility, postural dysfunction, prosthetic dependency , obesity, and pain.    ACTIVITY LIMITATIONS carrying, lifting,  bending, standing, stairs, transfers, locomotion level, UE use with ADLs, sleep    PARTICIPATION LIMITATIONS: driving, community activity, and yard work, UE use with ADLs &  household activities   [place], Time since onset of injury/illness/exacerbation, and 3+ comorbidities: see PMH  are also affecting patient's functional outcome.    REHAB POTENTIAL: Good   CLINICAL DECISION MAKING: Evolving/moderate complexity   EVALUATION COMPLEXITY: Moderate     GOALS: Goals reviewed with patient? Yes  SHORT TERM GOALS: Target date: 09/29/2022  1. Patient able to  pick up items from floor with single UE support on walker or surface. Baseline: SEE OBJECTIVE DATA Goal status: set 09/08/2022  2. Patient reports ambulating in home & entering/exiting home >90% of time over w/c use.  Baseline: SEE OBJECTIVE DATA Goal status: set 09/08/2022  3. Patient ambulates up to 200' with RW, prosthesis & AFO with supervision.  Goal status: ongoing 09/01/2022     UPDATED LONG TERM GOALS: Target date: 10/26/2022   Patient demonstrates & verbalized understanding of prosthetic care to enable safe utilization of prosthesis. Baseline: SEE OBJECTIVE DATA Goal status: ongoing 08/03/22    Patient tolerates right prosthesis & left AFO / partial foot prosthesis wear >90% of awake hours without skin or limb pain issues. Baseline: SEE OBJECTIVE DATA Goal status: ongoing 08/03/22    Berg Balance >/= 20/56 and tasks of Berg with RW support >/= 52/56 to indicate lower fall risk with standing ADLs Baseline: SEE OBJECTIVE DATA Goal status: 08/03/2022 updated    Patient ambulates >300' with prosthesis & LRAD modified independent Baseline: SEE OBJECTIVE DATA Goal status: ongoing 08/03/2022   Patient negotiates ramps, curbs & stairs with single rail with prosthesis & LRAD modified independent. Baseline: SEE OBJECTIVE DATA Goal status: ongoing 08/03/22  PLAN: PT FREQUENCY: 2x/week   PT DURATION: 12  weeks  PLANNED INTERVENTIONS: Therapeutic exercises, Therapeutic activity, Neuromuscular re-education, Balance training, Gait training, Patient/Family education, Self Care, Stair training, Vestibular training, Canalith repositioning, Prosthetic training, DME instructions, Re-evaluation, and physical performance testing    PLAN FOR NEXT SESSION:  Continue to progress standing balance and prosthetic gait with rollator as tolerated with monitoring his respiratory response.   Debbe Odea, PT, DPT 09/16/2022, 8:01 AM

## 2022-09-19 ENCOUNTER — Ambulatory Visit (INDEPENDENT_AMBULATORY_CARE_PROVIDER_SITE_OTHER): Payer: Medicare Other | Admitting: Physical Therapy

## 2022-09-19 ENCOUNTER — Encounter: Payer: Self-pay | Admitting: Physical Therapy

## 2022-09-19 DIAGNOSIS — R293 Abnormal posture: Secondary | ICD-10-CM

## 2022-09-19 DIAGNOSIS — R2681 Unsteadiness on feet: Secondary | ICD-10-CM | POA: Diagnosis not present

## 2022-09-19 DIAGNOSIS — M6281 Muscle weakness (generalized): Secondary | ICD-10-CM | POA: Diagnosis not present

## 2022-09-19 DIAGNOSIS — R2689 Other abnormalities of gait and mobility: Secondary | ICD-10-CM

## 2022-09-19 DIAGNOSIS — Z9181 History of falling: Secondary | ICD-10-CM

## 2022-09-19 NOTE — Therapy (Signed)
OUTPATIENT PHYSICAL THERAPY TREATMENT NOTE   Patient Name: Shane [name] MRN: [id number] DOB:[birth year], 81 y.o., male Today's Date: 09/19/2022  PCP: Burnard Bunting, MD REFERRING PROVIDER: [name], MD    END OF SESSION:   PT End of Session - 09/19/22 0804     Visit Number 36    Number of Visits 1    Date for PT Re-Evaluation 10/26/22    Authorization Type UHC Medicare    Authorization Time Period UHC MEDICARE  $20 COPAY    Progress Note Due on Visit 16    PT Start Time 0800    PT Stop Time 0843    PT Time Calculation (min) 43 min    Equipment Utilized During Treatment Gait belt    Activity Tolerance Patient tolerated treatment well;Patient limited by fatigue    Behavior During Therapy WFL for tasks assessed/performed                  Past Medical History:  Diagnosis Date   Anxiety    AV block, Mobitz 1    Cataract    Chronic kidney disease    d/t DM   CKD (chronic kidney disease), stage III ([place])    Depression    Diabetes mellitus    Vgo disposal insulin bolus  simular to insulin pump   Dyspnea    GERD (gastroesophageal reflux disease)    History of kidney stones    passed   Hyperlipidemia    Hypertension    Idiopathic pulmonary fibrosis ([place]) 11/2016   ILD (interstitial lung disease) ([place])    Moderate aortic stenosis    a. 10/2019 Echo: EF 55-60%, Gr2 DD. Nl RV.    Neuromuscular disorder ([place])    Neuropathy associated with endocrine disorder ([place])    Nonobstructive CAD (coronary artery disease)    a. 2012 Cath: mod, nonobs dzs; b. 10/2016 MV: EF 60%, no ischemia.   OSA on CPAP 05/05/2017   Unattended Home Sleep Test 7/2/813-AHI 38.6/hour, desaturation to 64%, body weight 261 pounds   PONV (postoperative nausea and vomiting)    Sleep apnea     uses cpap asked to bring mask and tubing   Past Surgical History:  Procedure Laterality Date   ABDOMINAL AORTOGRAM W/LOWER EXTREMITY N/A 12/10/2020   Procedure: ABDOMINAL AORTOGRAM W/LOWER  EXTREMITY;  Surgeon: Cherre Robins, MD;  Location: [facility];  Service: Cardiovascular;  Laterality: N/A;   AMPUTATION Right 01/22/2021   Procedure: 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;  Surgeon: [name], MD;  Location: [facility];  Service: Orthopedics;  Laterality: Right;   AMPUTATION Right 03/17/2021   Procedure: RIGHT BELOW KNEE AMPUTATION;  Surgeon: [name], MD;  Location: [facility];  Service: Orthopedics;  Laterality: Right;   ANKLE FUSION Right 01/22/2021   Procedure: RIGHT FOOT TIBIOCALCANEAL FUSION;  Surgeon: [name], MD;  Location: [facility];  Service: Orthopedics;  Laterality: Right;   ANTERIOR FUSION CERVICAL SPINE  2012   CARDIAC CATHETERIZATION  2011   CARDIAC CATHETERIZATION N/A 11/09/2016   Procedure: Right Heart Cath;  Surgeon: Belva Crome, MD;  Location: [facility];  Service: Cardiovascular;  Laterality: N/A;   carpel tunnel     left wrist   CATARACT EXTRACTION     CATARACT EXTRACTION W/ INTRAOCULAR LENS  IMPLANT, BILATERAL  2013   CERVICAL LAMINECTOMY  2012   COLONOSCOPY N/A 01/14/2013   Procedure: COLONOSCOPY;  Surgeon: Irene Shipper, MD;  Location: [facility];  Service:  Endoscopy;  Laterality: N/A;   CORONARY ARTERY BYPASS GRAFT  11/04/2020   LIMA-LAD, SVG-OM1, SVG-PDA (Dr [facility]) dc 11/18/2020   EYE SURGERY     I & D EXTREMITY Right 02/19/2021   Procedure: RIGHT ANKLE DEBRIDEMENT AND PLACEMENT ANTIBIOTIC BEADS;  Surgeon: [name], MD;  Location: [facility];  Service: Orthopedics;  Laterality: Right;   KNEE SURGERY  1998   left   LEFT HEART CATH AND CORONARY ANGIOGRAPHY N/A 07/10/2020   Procedure: LEFT HEART CATH AND CORONARY ANGIOGRAPHY;  Surgeon: Sherren Mocha, MD;  Location: [facility];  Service: Cardiovascular;  Laterality: N/A;   LUMBAR LAMINECTOMY  2003   LUNG BIOPSY Left 12/26/2016   Procedure: LUNG BIOPSY;  Surgeon: [name], MD;  Location: [facility];  Service: Thoracic;  Laterality: Left;   PACEMAKER IMPLANT N/A  03/30/2020   Procedure: PACEMAKER IMPLANT;  Surgeon: Evans Lance, MD;  Location: [facility];  Service: Cardiovascular;  Laterality: N/A;   PERIPHERAL VASCULAR INTERVENTION Right 12/10/2020   Procedure: PERIPHERAL VASCULAR INTERVENTION;  Surgeon: Cherre Robins, MD;  Location: [facility];  Service: Cardiovascular;  Laterality: Right;  SFA   POSTERIOR FUSION CERVICAL SPINE  2012   PPM GENERATOR REMOVAL N/A 12/14/2020   Procedure: PPM GENERATOR REMOVAL;  Surgeon: Evans Lance, MD;  Location: [facility];  Service: Cardiovascular;  Laterality: N/A;   TEE WITHOUT CARDIOVERSION N/A 12/11/2020   Procedure: TRANSESOPHAGEAL ECHOCARDIOGRAM (TEE);  Surgeon: Geralynn Rile, MD;  Location: [place] [facility];  Service: Cardiovascular;  Laterality: N/A;   TRIGGER FINGER RELEASE  2011   4th finger left hand   VIDEO ASSISTED THORACOSCOPY Left 12/26/2016   Procedure: VIDEO ASSISTED THORACOSCOPY;  Surgeon: [name], MD;  Location: [facility];  Service: Thoracic;  Laterality: Left;   VIDEO BRONCHOSCOPY N/A 12/26/2016   Procedure: VIDEO BRONCHOSCOPY;  Surgeon: [name], MD;  Location: [facility];  Service: Thoracic;  Laterality: N/A;   Patient Active Problem List   Diagnosis Date Noted   Intermediate stage nonexudative age-related macular degeneration of both eyes 04/25/2022   Research study patient 12/24/2021   Post-operative pain    CKD (chronic kidney disease), stage II    Labile blood glucose    Pulmonary fibrosis ([place])    Hyponatremia    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[place]) 03/26/2021   S/P BKA (below knee amputation) unilateral, right ([place])    Shortness of breath    Acute blood loss anemia    Stage 3 chronic kidney disease ([place])    Atrial fibrillation ([place])    Controlled type 2 diabetes mellitus with hyperglycemia, with long-term current use of insulin (H[facility])    Postoperative pain    Postoperative hemorrhagic shock 03/20/2021   Atherosclerosis of native arteries of  extremities with gangrene, right leg ([place])    Hardware complicating wound infection ([place])    Abscess of bursa of right ankle 02/15/2021   Endocarditis 01/19/2021   Peripheral vascular disease ([place]) 12/29/2020   CHF (congestive heart failure), NYHA class II, chronic, diastolic ([place]) 42/59/5638   History of COVID-19 12/22/2020   SSS (sick sinus syndrome) ([place]) 12/22/2020   Acute bacterial endocarditis    MSSA bacteremia 12/09/2020   Diabetic foot ulcer ([place]) 12/09/2020   Foot drop, right foot    Generalized weakness 12/08/2020   Dehydration with hyponatremia 12/08/2020   Atrial fibrillation, chronic (HCC) 12/08/2020   Elevated troponin level not due myocardial infarction 12/08/2020   Adrenal  insufficiency ([place]) 11/14/2020   Orthostatic hypotension 11/14/2020   Physical deconditioning 11/14/2020   Difficulty sleeping 11/07/2020   Postoperative anemia due to acute blood loss 11/07/2020   Right ventricular dysfunction 11/05/2020   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/04/2020   Hearing loss 11/03/2020   Cardiac device in situ 09/09/2020   Coronary atherosclerosis due to lipid rich plaque 09/02/2020   COVID-19 virus infection 07/18/2020   Coronary artery disease involving native heart without angina pectoris 07/10/2020   Chronic kidney disease, stage 3a ([place]) 03/29/2020   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/28/2020   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 ([place]) 03/16/2020   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 ([place]) 03/16/2020   Right epiretinal membrane 03/16/2020   Posterior vitreous detachment of right eye 03/16/2020   Aortic stenosis, moderate 03/12/2020   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   Near syncope 04/27/2018   Hypoglycemia due to insulin 01/06/2018   Essential hypertension 01/06/2018   Diabetes mellitus type 2, with complication, on long term insulin pump ([place]) 01/06/2018    Syncope and collapse 01/05/2018   Encounter for therapeutic drug monitoring 06/29/2017   OSA on CPAP 05/05/2017   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[place]) 01/05/2017   Abnormal chest x-ray 10/12/2016   Benign neoplasm of colon 01/14/2013    REFERRING DIAG: Z89.511 (ICD-10-CM) - Hx of right BKA   ONSET DATE: 05/04/2022 MD visit with release to [facility] on LLE  THERAPY DIAG:  Unsteadiness on feet  Other abnormalities of gait and mobility  Muscle weakness (generalized)  Abnormal posture  History of falling  Rationale for Evaluation and Treatment Rehabilitation  PERTINENT HISTORY: CKD st III, AV block Mobitz, pacemaker, pulmonary fibrosis, CAD, CABG 21, HTN, A-Fib, DM2, PAD, right BKA, Left TMA  PRECAUTIONS: ICD/Pacemaker - continue monitoring vitals with activity and calculated HRmax is 151 bpm  SUBJECTIVE:  He is still having issues with his breathing but is getting better.  No falls.   PAIN:    Are you having pain? Yes: NPRS scale: today  0/10, over last week lowest 0/10  Pain location: left shoulder lateral just distal to joint Pain description: tingling when shoulder in improper posture. Aggravating factors: improper posture. Relieving factors: correcting posture   OBJECTIVE: (objective measures completed at initial evaluation unless otherwise dated)  COGNITION: Overall cognitive status: Within functional limits for tasks assessed   POSTURE: 05/09/2022:  rounded shoulders, forward head, and flexed trunk    LOWER EXTREMITY ROM:    UPPER EXTREMITY ROM:  ROM P:passive  A:active Left 06/23/22 Supine all limited by pain Left 08/01/22 Seated All painfree Right 08/01/22 Seated  Shoulder flexion 30* A: 148 A: 153  Shoulder extension 0* neutral A: 72 A: 74  Shoulder abduction 45* A: 135 A: 141  Shoulder adduction     Shoulder internal rotation 20* A: 84 A: 89  Shoulder external rotation 10* A: 88 A: 83  Elbow flexion     Elbow extension -15*     (Blank rows = not  tested)    ROM P:passive  A:active Right Eval 05/09/2022 Left Eval 05/09/2022 Bilateral 08/03/22  Hip flexion       Hip extension A: -25* standing A: -25* standing A: -15*  RW support standing  Hip abduction       Hip adduction       Hip internal rotation       Hip external rotation  Knee flexion       Knee extension       Ankle dorsiflexion NA P: -17*   Ankle plantarflexion NA     Ankle inversion NA     Ankle eversion NA      (Blank rows = not tested)   LOWER EXTREMITY MMT:   MMT Right Eval 05/09/2022 Left Eval 05/09/2022  Hip flexion      Hip extension Functional testing 3-/5 Functional testing 3-/5  Hip abduction Functional testing 3-/5 Functional testing 3-/5  Hip adduction      Hip internal rotation      Hip external rotation      Knee flexion Functional testing 3-/5 Functional testing 3-/5  Knee extension Functional testing 3/5 Functional testing 3/5  Ankle dorsiflexion      Ankle plantarflexion      Ankle inversion      Ankle eversion      (Blank rows = not tested)   POSTURE:  06/23/2022  seated - head forward, rounded shoulders and posterior pelvic tilt  TRANSFERS: 05/09/2022:  Sit to stand: SBA and requires use of armrests from 20" stable w/c & needs RW for stabilization 05/09/2022:  Stand to sit: SBA and requires use of armrests from 20" stable w/c & needs RW for stabilization   GAIT: 08/11/2022: Pt amb 100' with rollator, right TTA prosthesis & left AFO partial shoe filler with SBA. He demonstrates increased step length bil., intermittent impaired foot clearance on RLE  08/03/2022: 2-minute Walk Test with RW, right TTA prosthesis & left AFO partial shoe filler. Resting pre HR 70 SpO2 100% HR after activity HR 52  SpO2 100% light amount of light headness Distance 158' with supervision.   Gait pattern: step-to pattern, but distance improved since evaluation, Right hip hike, trunk flexed, and normal 4" BOS  Pt neg ramp & curb with RW, ight TTA prosthesis &  left AFO partial shoe filler with supervision. He required seated rest half way due to fatigue.    05/09/2022:  Gait pattern: step to pattern, decreased step length- Left, decreased stance time- Right, Right hip hike, antalgic, lateral hip instability, trunk flexed, and wide BOS Distance walked: 25' Assistive device utilized: Environmental consultant - 2 wheeled and TTA prosthesis RLE Level of assistance: Min A Comments: pt reports dizziness with turning in gait   UE FUNCTION: 06/23/2022:  Pt reports that he uses RUE for fine motor like writing but uses both hands for gross motor or sports.  Seated - pt unable to raise LUE in flexion or abd due to pain.  Supine - pt unable to move LUE in flexion or abd due to pain.  PROM flexion > pain than abd   Pt tolerates elbow ext with palm supinated but with palm neutral causes shoulder pain. Also painful with elbow already extended and pronate palm.   PT simulated pushing on RW thru LUE with manual resistance.  Pt reports slight increase in pain but not as sharp as above function.    FUNCTIONAL TESTs:  Merrilee Jansky Balance Scale: 08/03/2022:  13/56 & tasks of Berg with RW support 45/56     Berg Balance Scale: 05/09/2022:  9/56 & tasks of Berg with RW support 27/56    [facility] PT Assessment - 05/09/22 0930                Berg Balance Test    Sit to Stand Needs moderate or maximal assist to stand   task with RW support = 3  Standing Unsupported Unable to stand 30 seconds unassisted   task with RW support = 3    Sitting with Back Unsupported but Feet Supported on Floor or Stool Able to sit safely and securely 2 minutes     Stand to Sit Uses backs of legs against chair to control descent   task with RW support = 3    Transfers Able to transfer safely, definite need of hands     Standing Unsupported with Eyes Closed Needs help to keep from falling   task with RW support = 3    Standing Unsupported with Feet Together Needs help to attain position and unable to hold for 15 seconds    task with RW support = 2    From Standing, Reach Forward with Outstretched Arm Loses balance while trying/requires external support   task with RW support = 1    From Standing Position, Pick up Object from Floor Unable to try/needs assist to keep balance   task with RW support = 2    From Standing Position, Turn to Look Behind Over each Shoulder Needs assist to keep from losing balance and falling   task with RW support = 1    Turn 360 Degrees Needs assistance while turning   task with RW support = 0    Standing Unsupported, Alternately Place Feet on Step/Stool Needs assistance to keep from falling or unable to try   task with RW support = 0    Standing Unsupported, One Foot in [employer] balance while stepping or standing   task with RW support = 1    Standing on One Leg Unable to try or needs assist to prevent fall   task with RW support = 1    Total Score 9     Berg comment: tasks of Berg with RW support 27/56                     CURRENT PROSTHETIC WEAR ASSESSMENT: 08/03/2022: Pt wears both right TTA prosthesis & left AFO partial shoe filler>90% of awake hours.  He has 2 wounds on dorsum of left foot being monitored by PCP.  He is wearing left Vive Wear compression sock below knee on LLE which has helped wounds to heal. Pt is independent in donning & doffing both prosthesis & AFO. Skin check, cleaning and minor cues on adjusting ply socks.   05/09/2022:   Patient is dependent with: skin check, residual limb care, care of non-amputated limb, prosthetic cleaning, ply sock cleaning, and correct ply sock adjustment Donning prosthesis: Modified independence Doffing prosthesis: Modified independence Prosthetic wear tolerance: >90% of awake hours/day, 7 days/week Prosthetic weight bearing tolerance: 5 minutes Edema: LLE pitting edema with >10 sec capillary refill Residual limb condition: Right BKA limb has no openings, normal temperature & moisture.  Redness patella & patella tendon from  friction of liner with knee flexed. Cylinderical shape.  LLE TMA pt's wife showed picture of wound on dorsum of foot that is being managed by PCP.   Prosthetic description: silicon liner with pin lock suspension, total contact socket, dynamic response foot.      TODAY'S TREATMENT:  09/19/2022 Neuromuscular Re-ed: Standing feet hip width apart on floor with single UE support //bar with single UE red theraband resistance working on upright posture: 10 reps ea UE for forward reach, row & biceps curl. Standing feet hip width apart on foam with single UE support //bar with single UE red theraband resistance working on  upright posture: 10 reps ea UE for forward reach, row & biceps curl. Standing on foam with light BUE support static with eyes closed 10 sec 3 reps Standing on foam with light BUE support using mirror for upright posture alternating LEs 5 reps ea - marching, forward kicks, abduction, extension & adduction both ant/post to stance LE. Rocker board with BUE support - ant/level/post & right/level/left  initially with squard 2 pivot board & progressed to round single pivot board. Backwards gait in //bars with PT cues on technique 8' X 2 Side stepping right & left in //bars with PT cues on technique 8' X 2  09/16/2022 -Ambulation with RW and SBA 75 feet X 2, then 100 feet with longer rest break to catch his breath.  Neuromuscular Re-ed: Standing with back of hands on //bars to minimize UE weight bearing:  head turns and head nods X 15 each- Stepping onto foam mat for stance and tapping cone anterior to foam mat with contralateral LE for 10 reps ea LE with BUE support Stepping onto foam mat for stance and tapping cone across midline for hip adduction to foam mat with contralateral LE for 10 reps ea LE with BUE support Stepping onto foam mat for stance and tapping cone anterior-lateral for hip abduction to foam mat with contralateral LE for 15 reps ea LE with BUE support Sit to/from stand  without UE assist from 24" stool with minA/tactile cues 5 reps Backwards gait in //bars with PT cues on technique 8' X 4 Side stepping right & left in //bars with PT cues on technique 8' X 4  09/12/2022 Neuromuscular Re-ed: Standing with back of hands on //bars to minimize UE weight bearing:  Eyes open then eyes closed ea direction 5 reps - right/left, up/down & 2 diagonals;  standing on foam with light UE support EO 4 head directions 5 reps. Stepping onto foam mat for stance and tapping cone anterior to foam mat with contralateral LE for 10 reps ea LE with BUE support Stepping onto foam mat for stance and tapping cone across midline for hip adduction to foam mat with contralateral LE for 10 reps ea LE with BUE support Stepping onto foam mat for stance and tapping cone anterior-lateral for hip abduction to foam mat with contralateral LE for 10 reps ea LE with BUE support Sit to/from stand without UE assist from 24" stool with minA/tactile cues 5 reps Backwards gait in //bars with PT cues on technique 8' X 2 Side stepping right & left in //bars with PT cues on technique 8' X 2  Therapeutic Activities: Pt able to pick up item from floor with RUE support on RW reaching with LUE:  1st & 3rd rep with RUE on RW armrest with minA & 2nd rep on mid bar of RW with min guard.  Pt verbalizes easier with hand on mid bar. PT reviewed short (household with goal frequency), long (max tolerable) and medium distance.  PT acknowledged that distances & frequency may be reduced with illnesses like congestion but he still needs to try to stay active throughout awake hours.  Being sedentary will lead to deconditioning.  PT recommended trying to keep prosthesis & AFO on limbs until going to bed for the night so he can ambulate to bathroom & reduce fall risk.  Pt & wife verbalized understanding.      HOME EXERCISE PROGRAM  Access Code: [email or web address] URL: [email or web address] Date: 07/05/2022 Prepared by:  Jamey Reas  Exercises - Seated Correct Posture  -  1 x daily - 7 x weekly - Seated Scapular Retraction  - 2-5 x daily - 7 x weekly - 1 sets - 10 reps - 5 seconds hold - Supine Chin Tucks on Flat Ball  - 2-5 x daily - 7 x weekly - 1 sets - 10 reps - 5 seconds hold - Seated Isometric Cervical Retraction with Chin Tucks with Pillow behind head  - 2-5 x daily - 7 x weekly - 1 sets - 10 reps - 5 seconds hold - Circular Shoulder Pendulum with Table Support  - 1-2 x daily - 7 x weekly - 1-2 sets - 10 reps - 5 seconds hold - Flexion-Extension Shoulder Pendulum with Table Support  - 1-2 x daily - 7 x weekly - 1-2 sets - 10 reps - Horizontal Shoulder Pendulum with Table Support  - 1-2 x daily - 7 x weekly - 1-2 sets - 10 reps - Supine Shoulder Flexion Extension AAROM with Dowel  - 1 x daily - 7 x weekly - 1-2 sets - 10 reps - 5 seconds hold - Supine Shoulder Abduction AAROM with Dowel  - 1 x daily - 7 x weekly - 1-2 sets - 10 reps - 5 seconds hold - Supine Shoulder External Rotation with Dowel  - 1 x daily - 7 x weekly - 1-2 sets - 10 reps - 5 seconds hold   Neuromuscular Reed-  cues on proprioception RLE using residual limb, balance & standing tolerance.   Do each exercise 1-2  times per day Do each exercise 5-10 repetitions Hold each exercise for 2 seconds to feel your location  AT [place].  Try to find this position when standing still for activities.   USE TAPE ON FLOOR TO MARK THE MIDLINE POSITION which is even with middle of sink.  You also should try to feel with your limb pressure in socket.  You are trying to feel with limb what you used to feel with the bottom of your foot.  Side to Side Shift: Moving your hips only (not shoulders): move weight onto your left leg, HOLD/FEEL pressure in socket.  Move back to equal weight on each leg, HOLD/FEEL pressure in socket. Move weight onto your right leg, HOLD/FEEL pressure in  socket. Move back to equal weight on each leg, HOLD/FEEL pressure in socket. Repeat.  Start with both hands on sink, progress to hand on prosthetic side only, then no hands.  Front to Back Shift: Moving your hips only (not shoulders): move your weight forward onto your toes, HOLD/FEEL pressure in socket. Move your weight back to equal Flat Foot on both legs, HOLD/FEEL  pressure in socket. Move your weight back onto your heels, HOLD/FEEL  pressure in socket. Move your weight back to equal on both legs, HOLD/FEEL  pressure in socket. Repeat.  Start with both hands on sink, progress to hand on prosthetic side only, then no hands.  Moving Cones / Cups: With equal weight on each leg: Hold on with one hand the first time, then progress to no hand supports. Move cups from one side of sink to the other. Place cups ~2" out of your reach, progress to 10" beyond reach.  Place one hand in middle of sink and reach with other hand. Do both arms.  Then hover one hand and move cups with other hand.  Overhead/Upward Reaching: alternated reaching up to top cabinets or ceiling if no cabinets present. Keep  equal weight on each leg. Start with one hand support on counter while other hand reaches and progress to no hand support with reaching.  ace one hand in middle of sink and reach with other hand. Do both arms.  Then hover one hand and move cups with other hand.  5.   Looking Over Shoulders: With equal weight on each leg: alternate turning to look over your shoulders with one hand support on counter as needed.  Start with head motions only to look in front of shoulder, then even with shoulder and progress to looking behind you. To look to side, move head /eyes, then shoulder on side looking pulls back, shift more weight to side looking and pull hip back. Place one hand in middle of sink and let go with other hand so your shoulder can pull back. Switch hands to look other way.   Then hover one hand and look over shoulder. If  looking right, use left hand at sink. If looking left, use right hand at sink. 6.  Alternate Tapping Toes or Stepping to lower shelf of cabinet  Move items under cabinet out of your way. Shift your hips/pelvis so weight on stance leg & Tighten muscles in hip.  SLOWLY step other leg so front of foot is in cabinet. Then step back to floor. Repeat with other leg.     ASSESSMENT:   CLINICAL IMPRESSION: Patient continues to be limited by [facility] but recovers quickly with short rest.  His balance is improving with PT adding resistance and more activities on compliant surfaces.  Pt continues to benefit from skilled PT  OBJECTIVE IMPAIRMENTS .Abnormal gait, cardiopulmonary status limiting activity, decreased activity tolerance, decreased balance, decreased endurance, decreased knowledge of use of DME, decreased mobility, decreased ROM, decreased strength, dizziness, increased edema, impaired flexibility, postural dysfunction, prosthetic dependency , obesity, and pain.    ACTIVITY LIMITATIONS carrying, lifting, bending, standing, stairs, transfers, locomotion level, UE use with ADLs, sleep    PARTICIPATION LIMITATIONS: driving, community activity, and yard work, UE use with ADLs &  household activities   [place], Time since onset of injury/illness/exacerbation, and 3+ comorbidities: see PMH  are also affecting patient's functional outcome.    REHAB POTENTIAL: Good   CLINICAL DECISION MAKING: Evolving/moderate complexity   EVALUATION COMPLEXITY: Moderate     GOALS: Goals reviewed with patient? Yes  SHORT TERM GOALS: Target date: 09/29/2022  1. Patient able to pick up items from floor with single UE support on walker or surface. Baseline: SEE OBJECTIVE DATA Goal status: set 09/08/2022  2. Patient reports ambulating in home & entering/exiting home >90% of time over w/c use.  Baseline: SEE OBJECTIVE DATA Goal status: set 09/08/2022  3. Patient ambulates up to 200' with RW,  prosthesis & AFO with supervision.  Goal status: ongoing 09/01/2022     UPDATED LONG TERM GOALS: Target date: 10/26/2022   Patient demonstrates & verbalized understanding of prosthetic care to enable safe utilization of prosthesis. Baseline: SEE OBJECTIVE DATA Goal status: ongoing 08/03/22    Patient tolerates right prosthesis & left AFO / partial foot prosthesis wear >90% of awake hours without skin or limb pain issues. Baseline: SEE OBJECTIVE DATA Goal status: ongoing 08/03/22    Berg Balance >/= 20/56 and tasks of Berg with RW support >/= 52/56 to indicate lower fall risk with standing ADLs Baseline: SEE OBJECTIVE DATA Goal status: 08/03/2022 updated    Patient ambulates >300' with prosthesis & LRAD modified independent Baseline: SEE OBJECTIVE DATA Goal  status: ongoing 08/03/2022   Patient negotiates ramps, curbs & stairs with single rail with prosthesis & LRAD modified independent. Baseline: SEE OBJECTIVE DATA Goal status: ongoing 08/03/22  PLAN: PT FREQUENCY: 2x/week   PT DURATION: 12 weeks  PLANNED INTERVENTIONS: Therapeutic exercises, Therapeutic activity, Neuromuscular re-education, Balance training, Gait training, Patient/Family education, Self Care, Stair training, Vestibular training, Canalith repositioning, Prosthetic training, DME instructions, Re-evaluation, and physical performance testing    PLAN FOR NEXT SESSION:   Progress standing balance working on compliant surfaces and prosthetic gait with rollator as tolerated with monitoring his respiratory response.   Jamey Reas, PT, DPT 09/19/2022, 10:23 AM

## 2022-09-21 ENCOUNTER — Encounter: Payer: Self-pay | Admitting: Physical Therapy

## 2022-09-21 ENCOUNTER — Ambulatory Visit (INDEPENDENT_AMBULATORY_CARE_PROVIDER_SITE_OTHER): Payer: Medicare Other | Admitting: Physical Therapy

## 2022-09-21 DIAGNOSIS — R293 Abnormal posture: Secondary | ICD-10-CM

## 2022-09-21 DIAGNOSIS — R2681 Unsteadiness on feet: Secondary | ICD-10-CM

## 2022-09-21 DIAGNOSIS — M6281 Muscle weakness (generalized): Secondary | ICD-10-CM

## 2022-09-21 DIAGNOSIS — R2689 Other abnormalities of gait and mobility: Secondary | ICD-10-CM

## 2022-09-21 NOTE — Therapy (Signed)
OUTPATIENT PHYSICAL THERAPY TREATMENT NOTE   Patient Name: Shane [name] MRN: [id number] DOB:09-08-1941, 81 y.o., male Today's Date: 09/21/2022  PCP: Burnard Bunting, MD REFERRING PROVIDER: [name], MD    END OF SESSION:   PT End of Session - 09/21/22 0757     Visit Number 37    Number of Visits 71    Date for PT Re-Evaluation 10/26/22    Authorization Type UHC Medicare    Authorization Time Period [facility] MEDICARE  $20 COPAY    Progress Note Due on Visit 59    PT Start Time 0758    PT Stop Time 0841    PT Time Calculation (min) 43 min    Equipment Utilized During Treatment Gait belt    Activity Tolerance Patient tolerated treatment well;Patient limited by fatigue    Behavior During Therapy WFL for tasks assessed/performed                   Past Medical History:  Diagnosis Date   Anxiety    AV block, Mobitz 1    Cataract    Chronic kidney disease    d/t DM   CKD (chronic kidney disease), stage III ([place])    Depression    Diabetes mellitus    Vgo disposal insulin bolus  simular to insulin pump   Dyspnea    GERD (gastroesophageal reflux disease)    History of kidney stones    passed   Hyperlipidemia    Hypertension    Idiopathic pulmonary fibrosis ([place]) 11/2016   ILD (interstitial lung disease) ([place])    Moderate aortic stenosis    a. 10/2019 Echo: EF 55-60%, Gr2 DD. Nl RV.    Neuromuscular disorder ([place])    Neuropathy associated with endocrine disorder ([place])    Nonobstructive CAD (coronary artery disease)    a. 2012 Cath: mod, nonobs dzs; b. 10/2016 MV: EF 60%, no ischemia.   OSA on CPAP 05/05/2017   Unattended Home Sleep Test 7/2/813-AHI 38.6/hour, desaturation to 64%, body weight 261 pounds   PONV (postoperative nausea and vomiting)    Sleep apnea     uses cpap asked to bring mask and tubing   Past Surgical History:  Procedure Laterality Date   ABDOMINAL AORTOGRAM W/LOWER EXTREMITY N/A 12/10/2020   Procedure: ABDOMINAL AORTOGRAM W/LOWER  EXTREMITY;  Surgeon: Cherre Robins, MD;  Location: [place] [facility];  Service: Cardiovascular;  Laterality: N/A;   AMPUTATION Right 01/22/2021   Procedure: 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;  Surgeon: [name], MD;  Location: [place];  Service: Orthopedics;  Laterality: Right;   AMPUTATION Right 03/17/2021   Procedure: RIGHT BELOW KNEE AMPUTATION;  Surgeon: [name], MD;  Location: [place];  Service: Orthopedics;  Laterality: Right;   ANKLE FUSION Right 01/22/2021   Procedure: RIGHT FOOT TIBIOCALCANEAL FUSION;  Surgeon: [name], MD;  Location: [place];  Service: Orthopedics;  Laterality: Right;   ANTERIOR FUSION CERVICAL SPINE  2012   CARDIAC CATHETERIZATION  2011   CARDIAC CATHETERIZATION N/A 11/09/2016   Procedure: Right Heart Cath;  Surgeon: Belva Crome, MD;  Location: [place] [facility];  Service: Cardiovascular;  Laterality: N/A;   carpel tunnel     left wrist   CATARACT EXTRACTION     CATARACT EXTRACTION W/ INTRAOCULAR LENS  IMPLANT, BILATERAL  2013   CERVICAL LAMINECTOMY  2012   COLONOSCOPY N/A 01/14/2013   Procedure: COLONOSCOPY;  Surgeon: Irene Shipper, MD;  Location: [facility];  Service: Endoscopy;  Laterality: N/A;   CORONARY ARTERY BYPASS GRAFT  11/04/2020   LIMA-LAD, SVG-OM1, SVG-PDA (Dr Marney [facility]) dc 11/18/2020   EYE SURGERY     I & D EXTREMITY Right 02/19/2021   Procedure: RIGHT ANKLE DEBRIDEMENT AND PLACEMENT ANTIBIOTIC BEADS;  Surgeon: [name], MD;  Location: [place];  Service: Orthopedics;  Laterality: Right;   KNEE SURGERY  1998   left   LEFT HEART CATH AND CORONARY ANGIOGRAPHY N/A 07/10/2020   Procedure: LEFT HEART CATH AND CORONARY ANGIOGRAPHY;  Surgeon: Sherren Mocha, MD;  Location: [place] [facility];  Service: Cardiovascular;  Laterality: N/A;   LUMBAR LAMINECTOMY  2003   LUNG BIOPSY Left 12/26/2016   Procedure: LUNG BIOPSY;  Surgeon: [name], MD;  Location: [place];  Service: Thoracic;  Laterality: Left;   PACEMAKER IMPLANT N/A  03/30/2020   Procedure: PACEMAKER IMPLANT;  Surgeon: Evans Lance, MD;  Location: [place] [facility];  Service: Cardiovascular;  Laterality: N/A;   PERIPHERAL VASCULAR INTERVENTION Right 12/10/2020   Procedure: PERIPHERAL VASCULAR INTERVENTION;  Surgeon: Cherre Robins, MD;  Location: [place] [facility];  Service: Cardiovascular;  Laterality: Right;  SFA   POSTERIOR FUSION CERVICAL SPINE  2012   PPM GENERATOR REMOVAL N/A 12/14/2020   Procedure: PPM GENERATOR REMOVAL;  Surgeon: Evans Lance, MD;  Location: [place] [facility];  Service: Cardiovascular;  Laterality: N/A;   TEE WITHOUT CARDIOVERSION N/A 12/11/2020   Procedure: TRANSESOPHAGEAL ECHOCARDIOGRAM (TEE);  Surgeon: Geralynn Rile, MD;  Location: [place];  Service: Cardiovascular;  Laterality: N/A;   TRIGGER FINGER RELEASE  2011   4th finger left hand   VIDEO ASSISTED THORACOSCOPY Left 12/26/2016   Procedure: VIDEO ASSISTED THORACOSCOPY;  Surgeon: [name], MD;  Location: [place];  Service: Thoracic;  Laterality: Left;   VIDEO BRONCHOSCOPY N/A 12/26/2016   Procedure: VIDEO BRONCHOSCOPY;  Surgeon: [name], MD;  Location: [place];  Service: Thoracic;  Laterality: N/A;   Patient Active Problem List   Diagnosis Date Noted   Intermediate stage nonexudative age-related macular degeneration of both eyes 04/25/2022   Research study patient 12/24/2021   Post-operative pain    CKD (chronic kidney disease), stage II    Labile blood glucose    Pulmonary fibrosis ([place])    Hyponatremia    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)    Abscess of bursa of right ankle 02/15/2021   Endocarditis 01/19/2021   Peripheral vascular disease ([place]) 12/29/2020   CHF (congestive heart failure), NYHA class II, chronic, diastolic ([place]) 03/00/9233   History of COVID-19 12/22/2020   SSS (sick sinus syndrome) ([place]) 12/22/2020   Acute bacterial endocarditis    MSSA bacteremia 12/09/2020   Diabetic foot ulcer ([place]) 12/09/2020   Foot drop, right foot    Generalized weakness 12/08/2020   Dehydration with hyponatremia 12/08/2020   Atrial fibrillation, chronic ([place]) 12/08/2020   Elevated troponin level not due myocardial infarction 12/08/2020  Adrenal insufficiency ([place]) 11/14/2020   Orthostatic hypotension 11/14/2020   Physical deconditioning 11/14/2020   Difficulty sleeping 11/07/2020   Postoperative anemia due to acute blood loss 11/07/2020   Right ventricular dysfunction 11/05/2020   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/04/2020   Hearing loss 11/03/2020   Cardiac device in situ 09/09/2020   Coronary atherosclerosis due to lipid rich plaque 09/02/2020   COVID-19 virus infection 07/18/2020   Coronary artery disease involving native heart without angina pectoris 07/10/2020   Chronic kidney disease, stage 3a ([place]) 03/29/2020   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/28/2020   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 ([place]) 03/16/2020   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 ([place]) 03/16/2020   Right epiretinal membrane 03/16/2020   Posterior vitreous detachment of right eye 03/16/2020   Aortic stenosis, moderate 03/12/2020   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   Near syncope 04/27/2018   Hypoglycemia due to insulin 01/06/2018   Essential hypertension 01/06/2018   Diabetes mellitus type 2, with complication, on long term insulin pump ([place]) 01/06/2018    Syncope and collapse 01/05/2018   Encounter for therapeutic drug monitoring 06/29/2017   OSA on CPAP 05/05/2017   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[place]) 01/05/2017   Abnormal chest x-ray 10/12/2016   Benign neoplasm of colon 01/14/2013    REFERRING DIAG: Z89.511 (ICD-10-CM) - Hx of right BKA   ONSET DATE: 05/04/2022 MD visit with release to [facility] on LLE  THERAPY DIAG:  Unsteadiness on feet  Other abnormalities of gait and mobility  Muscle weakness (generalized)  Abnormal posture  Rationale for Evaluation and Treatment Rehabilitation  PERTINENT HISTORY: CKD st III, AV block Mobitz, pacemaker, pulmonary fibrosis, CAD, CABG 21, HTN, A-Fib, DM2, PAD, right BKA, Left TMA  PRECAUTIONS: ICD/Pacemaker - continue monitoring vitals with activity and calculated HRmax is 151 bpm  SUBJECTIVE:  He is still having issues with his breathing but is getting better. He is trying to stay as active as possible.   No falls.   PAIN:    Are you having pain? Yes: NPRS scale: today  0/10, over last week lowest 0/10  Pain location: left shoulder lateral just distal to joint Pain description: tingling when shoulder in improper posture. Aggravating factors: improper posture. Relieving factors: correcting posture   OBJECTIVE: (objective measures completed at initial evaluation unless otherwise dated)  COGNITION: Overall cognitive status: Within functional limits for tasks assessed   POSTURE: 05/09/2022:  rounded shoulders, forward head, and flexed trunk    LOWER EXTREMITY ROM:    UPPER EXTREMITY ROM:  ROM P:passive  A:active Left 06/23/22 Supine all limited by pain Left 08/01/22 Seated All painfree Right 08/01/22 Seated  Shoulder flexion 30* A: 148 A: 153  Shoulder extension 0* neutral A: 72 A: 74  Shoulder abduction 45* A: 135 A: 141  Shoulder adduction     Shoulder internal rotation 20* A: 84 A: 89  Shoulder external rotation 10* A: 88 A: 83  Elbow flexion     Elbow extension -15*      (Blank rows = not tested)    ROM P:passive  A:active Right Eval 05/09/2022 Left Eval 05/09/2022 Bilateral 08/03/22  Hip flexion       Hip extension A: -25* standing A: -25* standing A: -15*  RW support standing  Hip abduction       Hip adduction       Hip internal rotation  Hip external rotation       Knee flexion       Knee extension       Ankle dorsiflexion NA P: -17*   Ankle plantarflexion NA     Ankle inversion NA     Ankle eversion NA      (Blank rows = not tested)   LOWER EXTREMITY MMT:   MMT Right Eval 05/09/2022 Left Eval 05/09/2022  Hip flexion      Hip extension Functional testing 3-/5 Functional testing 3-/5  Hip abduction Functional testing 3-/5 Functional testing 3-/5  Hip adduction      Hip internal rotation      Hip external rotation      Knee flexion Functional testing 3-/5 Functional testing 3-/5  Knee extension Functional testing 3/5 Functional testing 3/5  Ankle dorsiflexion      Ankle plantarflexion      Ankle inversion      Ankle eversion      (Blank rows = not tested)   POSTURE:  06/23/2022  seated - head forward, rounded shoulders and posterior pelvic tilt  TRANSFERS: 05/09/2022:  Sit to stand: SBA and requires use of armrests from 20" stable w/c & needs RW for stabilization 05/09/2022:  Stand to sit: SBA and requires use of armrests from 20" stable w/c & needs RW for stabilization   GAIT: 08/11/2022: Pt amb 100' with rollator, right TTA prosthesis & left AFO partial shoe filler with SBA. He demonstrates increased step length bil., intermittent impaired foot clearance on RLE  08/03/2022: 2-minute Walk Test with RW, right TTA prosthesis & left AFO partial shoe filler. Resting pre HR 70 SpO2 100% HR after activity HR 52  SpO2 100% light amount of light headness Distance 158' with supervision.   Gait pattern: step-to pattern, but distance improved since evaluation, Right hip hike, trunk flexed, and normal 4" BOS  Pt neg ramp & curb with RW, ight  TTA prosthesis & left AFO partial shoe filler with supervision. He required seated rest half way due to fatigue.    05/09/2022:  Gait pattern: step to pattern, decreased step length- Left, decreased stance time- Right, Right hip hike, antalgic, lateral hip instability, trunk flexed, and wide BOS Distance walked: 25' Assistive device utilized: Environmental consultant - 2 wheeled and TTA prosthesis RLE Level of assistance: Min A Comments: pt reports dizziness with turning in gait   UE FUNCTION: 06/23/2022:  Pt reports that he uses RUE for fine motor like writing but uses both hands for gross motor or sports.  Seated - pt unable to raise LUE in flexion or abd due to pain.  Supine - pt unable to move LUE in flexion or abd due to pain.  PROM flexion > pain than abd   Pt tolerates elbow ext with palm supinated but with palm neutral causes shoulder pain. Also painful with elbow already extended and pronate palm.   PT simulated pushing on RW thru LUE with manual resistance.  Pt reports slight increase in pain but not as sharp as above function.    FUNCTIONAL TESTs:  Merrilee Jansky Balance Scale: 08/03/2022:  13/56 & tasks of Berg with RW support 45/56  [facility] PT Assessment - 08/03/22 0800                Berg Balance Test    Sit to Stand Needs minimal aid to stand or to stabilize   task with RW = 3    Standing Unsupported Able to stand 30 seconds unsupported  task with RW = 4    Sitting with Back Unsupported but Feet Supported on Floor or Stool Able to sit safely and securely 2 minutes     Stand to Sit Controls descent by using hands   task with RW = 3    Transfers Able to transfer safely, definite need of hands     Standing Unsupported with Eyes Closed Unable to keep eyes closed 3 seconds but stays steady   task with RW = 4    Standing Unsupported with Feet Together Needs help to attain position and unable to hold for 15 seconds   task with RW = 3    From Standing, Reach Forward with Outstretched Arm Reaches forward but  needs supervision   task with RW = 4    From Standing Position, Pick up Object from Floor Unable to try/needs assist to keep balance   task with RW = 3    From Standing Position, Turn to Look Behind Over each Shoulder Needs assist to keep from losing balance and falling   task with RW = 3    Turn 360 Degrees Needs assistance while turning   task with RW = 2    Standing Unsupported, Alternately Place Feet on Step/Stool Needs assistance to keep from falling or unable to try   task with RW = 4    Standing Unsupported, One Foot in [employer] balance while stepping or standing   task with RW = 3    Standing on One Leg Unable to try or needs assist to prevent fall   task with RW = 4    Total Score 15     Berg comment: tasks of Berg with RW = 45/56        Berg Balance Scale: 05/09/2022:  9/56 & tasks of Berg with RW support 27/56    [facility] PT Assessment - 05/09/22 0930                Berg Balance Test    Sit to Stand Needs moderate or maximal assist to stand   task with RW support = 3    Standing Unsupported Unable to stand 30 seconds unassisted   task with RW support = 3    Sitting with Back Unsupported but Feet Supported on Floor or Stool Able to sit safely and securely 2 minutes     Stand to Sit Uses backs of legs against chair to control descent   task with RW support = 3    Transfers Able to transfer safely, definite need of hands     Standing Unsupported with Eyes Closed Needs help to keep from falling   task with RW support = 3    Standing Unsupported with Feet Together Needs help to attain position and unable to hold for 15 seconds   task with RW support = 2    From Standing, Reach Forward with Outstretched Arm Loses balance while trying/requires external support   task with RW support = 1    From Standing Position, Pick up Object from Floor Unable to try/needs assist to keep balance   task with RW support = 2    From Standing Position, Turn to Look Behind Over each Shoulder Needs assist  to keep from losing balance and falling   task with RW support = 1    Turn 360 Degrees Needs assistance while turning   task with RW support = 0    Standing Unsupported, Alternately Place Feet  on Step/Stool Needs assistance to keep from falling or unable to try   task with RW support = 0    Standing Unsupported, One Foot in [employer] balance while stepping or standing   task with RW support = 1    Standing on One Leg Unable to try or needs assist to prevent fall   task with RW support = 1    Total Score 9     Berg comment: tasks of Berg with RW support 27/56                     CURRENT PROSTHETIC WEAR ASSESSMENT: 08/03/2022: Pt wears both right TTA prosthesis & left AFO partial shoe filler>90% of awake hours.  He has 2 wounds on dorsum of left foot being monitored by PCP.  He is wearing left Vive Wear compression sock below knee on LLE which has helped wounds to heal. Pt is independent in donning & doffing both prosthesis & AFO. Skin check, cleaning and minor cues on adjusting ply socks.   05/09/2022:   Patient is dependent with: skin check, residual limb care, care of non-amputated limb, prosthetic cleaning, ply sock cleaning, and correct ply sock adjustment Donning prosthesis: Modified independence Doffing prosthesis: Modified independence Prosthetic wear tolerance: >90% of awake hours/day, 7 days/week Prosthetic weight bearing tolerance: 5 minutes Edema: LLE pitting edema with >10 sec capillary refill Residual limb condition: Right BKA limb has no openings, normal temperature & moisture.  Redness patella & patella tendon from friction of liner with knee flexed. Cylinderical shape.  LLE TMA pt's wife showed picture of wound on dorsum of foot that is being managed by PCP.   Prosthetic description: silicon liner with pin lock suspension, total contact socket, dynamic response foot.      TODAY'S TREATMENT:  09/21/2022 Neuromuscular Re-ed: Standing feet hip width apart on floor with  single UE support //bar with single UE red theraband resistance working on upright posture: 10 reps ea UE for forward reach, row & biceps curl. Standing feet hip width apart on foam with single UE support //bar with single UE red theraband resistance working on upright posture: 10 reps ea UE for forward reach, row & biceps curl. Standing on foam with light BUE support static with eyes closed 10 sec 3 reps Standing on foam with light UE support feet hip width: reaching single UE knee to overhead & reaching behind with trunk rotation following hand with eyes 10 rep ea. Standing on foam with light BUE support using mirror for upright posture alternating LEs 5 reps ea - forward kicks, abduction, extension & adduction both ant/post to stance LE. Rocker board with BUE support - ant/level/post & right/level/left  initially with round single pivot board. Step up & down leading with BLEs with BUE support //bars on 4" step 5 reps ea LE Backwards gait in //bars with PT cues on technique 8' X 2 Side stepping right & left in //bars with PT cues on technique 8' X 2  Pt continues to need short frequent rests between activities due to [facility]  09/19/2022 Neuromuscular Re-ed: Standing feet hip width apart on floor with single UE support //bar with single UE red theraband resistance working on upright posture: 10 reps ea UE for forward reach, row & biceps curl. Standing feet hip width apart on foam with single UE support //bar with single UE red theraband resistance working on upright posture: 10 reps ea UE for forward reach, row & biceps curl. Standing on  foam with light BUE support static with eyes closed 10 sec 3 reps Standing on foam with light BUE support using mirror for upright posture alternating LEs 5 reps ea - marching, forward kicks, abduction, extension & adduction both ant/post to stance LE. Rocker board with BUE support - ant/level/post & right/level/left  initially with squard 2 pivot board & progressed  to round single pivot board. Backwards gait in //bars with PT cues on technique 8' X 2 Side stepping right & left in //bars with PT cues on technique 8' X 2  09/16/2022 -Ambulation with RW and SBA 75 feet X 2, then 100 feet with longer rest break to catch his breath.  Neuromuscular Re-ed: Standing with back of hands on //bars to minimize UE weight bearing:  head turns and head nods X 15 each- Stepping onto foam mat for stance and tapping cone anterior to foam mat with contralateral LE for 10 reps ea LE with BUE support Stepping onto foam mat for stance and tapping cone across midline for hip adduction to foam mat with contralateral LE for 10 reps ea LE with BUE support Stepping onto foam mat for stance and tapping cone anterior-lateral for hip abduction to foam mat with contralateral LE for 15 reps ea LE with BUE support Sit to/from stand without UE assist from 24" stool with minA/tactile cues 5 reps Backwards gait in //bars with PT cues on technique 8' X 4 Side stepping right & left in //bars with PT cues on technique 8' X 4  09/12/2022 Neuromuscular Re-ed: Standing with back of hands on //bars to minimize UE weight bearing:  Eyes open then eyes closed ea direction 5 reps - right/left, up/down & 2 diagonals;  standing on foam with light UE support EO 4 head directions 5 reps. Stepping onto foam mat for stance and tapping cone anterior to foam mat with contralateral LE for 10 reps ea LE with BUE support Stepping onto foam mat for stance and tapping cone across midline for hip adduction to foam mat with contralateral LE for 10 reps ea LE with BUE support Stepping onto foam mat for stance and tapping cone anterior-lateral for hip abduction to foam mat with contralateral LE for 10 reps ea LE with BUE support Sit to/from stand without UE assist from 24" stool with minA/tactile cues 5 reps Backwards gait in //bars with PT cues on technique 8' X 2 Side stepping right & left in //bars with PT cues on  technique 8' X 2  Therapeutic Activities: Pt able to pick up item from floor with RUE support on RW reaching with LUE:  1st & 3rd rep with RUE on RW armrest with minA & 2nd rep on mid bar of RW with min guard.  Pt verbalizes easier with hand on mid bar. PT reviewed short (household with goal frequency), long (max tolerable) and medium distance.  PT acknowledged that distances & frequency may be reduced with illnesses like congestion but he still needs to try to stay active throughout awake hours.  Being sedentary will lead to deconditioning.  PT recommended trying to keep prosthesis & AFO on limbs until going to bed for the night so he can ambulate to bathroom & reduce fall risk.  Pt & wife verbalized understanding.      HOME EXERCISE PROGRAM  Access Code: [email or web address] URL: [email or web address] Date: 07/05/2022 Prepared by: Jamey Reas  Exercises - Seated Correct Posture  - 1 x daily - 7 x weekly - Seated Scapular Retraction  - 2-5  x daily - 7 x weekly - 1 sets - 10 reps - 5 seconds hold - Supine Chin Tucks on Flat Ball  - 2-5 x daily - 7 x weekly - 1 sets - 10 reps - 5 seconds hold - Seated Isometric Cervical Retraction with Chin Tucks with Pillow behind head  - 2-5 x daily - 7 x weekly - 1 sets - 10 reps - 5 seconds hold - Circular Shoulder Pendulum with Table Support  - 1-2 x daily - 7 x weekly - 1-2 sets - 10 reps - 5 seconds hold - Flexion-Extension Shoulder Pendulum with Table Support  - 1-2 x daily - 7 x weekly - 1-2 sets - 10 reps - Horizontal Shoulder Pendulum with Table Support  - 1-2 x daily - 7 x weekly - 1-2 sets - 10 reps - Supine Shoulder Flexion Extension AAROM with Dowel  - 1 x daily - 7 x weekly - 1-2 sets - 10 reps - 5 seconds hold - Supine Shoulder Abduction AAROM with Dowel  - 1 x daily - 7 x weekly - 1-2 sets - 10 reps - 5 seconds hold - Supine Shoulder External Rotation with Dowel  - 1 x daily - 7 x weekly - 1-2 sets - 10 reps - 5 seconds  hold   Neuromuscular Reed-  cues on proprioception RLE using residual limb, balance & standing tolerance.   Do each exercise 1-2  times per day Do each exercise 5-10 repetitions Hold each exercise for 2 seconds to feel your location  AT [place].  Try to find this position when standing still for activities.   USE TAPE ON FLOOR TO MARK THE MIDLINE POSITION which is even with middle of sink.  You also should try to feel with your limb pressure in socket.  You are trying to feel with limb what you used to feel with the bottom of your foot.  Side to Side Shift: Moving your hips only (not shoulders): move weight onto your left leg, HOLD/FEEL pressure in socket.  Move back to equal weight on each leg, HOLD/FEEL pressure in socket. Move weight onto your right leg, HOLD/FEEL pressure in socket. Move back to equal weight on each leg, HOLD/FEEL pressure in socket. Repeat.  Start with both hands on sink, progress to hand on prosthetic side only, then no hands.  Front to Back Shift: Moving your hips only (not shoulders): move your weight forward onto your toes, HOLD/FEEL pressure in socket. Move your weight back to equal Flat Foot on both legs, HOLD/FEEL  pressure in socket. Move your weight back onto your heels, HOLD/FEEL  pressure in socket. Move your weight back to equal on both legs, HOLD/FEEL  pressure in socket. Repeat.  Start with both hands on sink, progress to hand on prosthetic side only, then no hands.  Moving Cones / Cups: With equal weight on each leg: Hold on with one hand the first time, then progress to no hand supports. Move cups from one side of sink to the other. Place cups ~2" out of your reach, progress to 10" beyond reach.  Place one hand in middle of sink and reach with other hand. Do both arms.  Then hover one hand and move cups with other hand.  Overhead/Upward Reaching: alternated reaching up to top cabinets or  ceiling if no cabinets present. Keep equal weight on each leg. Start with one hand support on counter while  other hand reaches and progress to no hand support with reaching.  ace one hand in middle of sink and reach with other hand. Do both arms.  Then hover one hand and move cups with other hand.  5.   Looking Over Shoulders: With equal weight on each leg: alternate turning to look over your shoulders with one hand support on counter as needed.  Start with head motions only to look in front of shoulder, then even with shoulder and progress to looking behind you. To look to side, move head /eyes, then shoulder on side looking pulls back, shift more weight to side looking and pull hip back. Place one hand in middle of sink and let go with other hand so your shoulder can pull back. Switch hands to look other way.   Then hover one hand and look over shoulder. If looking right, use left hand at sink. If looking left, use right hand at sink. 6.  Alternate Tapping Toes or Stepping to lower shelf of cabinet  Move items under cabinet out of your way. Shift your hips/pelvis so weight on stance leg & Tighten muscles in hip.  SLOWLY step other leg so front of foot is in cabinet. Then step back to floor. Repeat with other leg.     ASSESSMENT:   CLINICAL IMPRESSION: Patient's balance is improving with functional balance activities even on compliant surfaces but requires UE support.  Patient continues to be limited by [facility] but recovers quickly with short rest. Pt continues to benefit from skilled PT  OBJECTIVE IMPAIRMENTS .Abnormal gait, cardiopulmonary status limiting activity, decreased activity tolerance, decreased balance, decreased endurance, decreased knowledge of use of DME, decreased mobility, decreased ROM, decreased strength, dizziness, increased edema, impaired flexibility, postural dysfunction, prosthetic dependency , obesity, and pain.    ACTIVITY LIMITATIONS carrying, lifting, bending, standing, stairs,  transfers, locomotion level, UE use with ADLs, sleep    PARTICIPATION LIMITATIONS: driving, community activity, and yard work, UE use with ADLs &  household activities   [place], Time since onset of injury/illness/exacerbation, and 3+ comorbidities: see PMH  are also affecting patient's functional outcome.    REHAB POTENTIAL: Good   CLINICAL DECISION MAKING: Evolving/moderate complexity   EVALUATION COMPLEXITY: Moderate     GOALS: Goals reviewed with patient? Yes  SHORT TERM GOALS: Target date: 09/29/2022  1. Patient able to pick up items from floor with single UE support on walker or surface. Baseline: SEE OBJECTIVE DATA Goal status: set 09/08/2022  2. Patient reports ambulating in home & entering/exiting home >90% of time over w/c use.  Baseline: SEE OBJECTIVE DATA Goal status: set 09/08/2022  3. Patient ambulates up to 200' with RW, prosthesis & AFO with supervision.  Goal status: ongoing 09/01/2022     UPDATED LONG TERM GOALS: Target date: 10/26/2022   Patient demonstrates & verbalized understanding of prosthetic care to enable safe utilization of prosthesis. Baseline: SEE OBJECTIVE DATA Goal status: ongoing 08/03/22    Patient tolerates right prosthesis & left AFO / partial foot prosthesis wear >90% of awake hours without skin or limb pain issues. Baseline: SEE OBJECTIVE DATA Goal status: ongoing 08/03/22    Berg Balance >/= 20/56 and tasks of Berg with RW support >/= 52/56 to indicate lower fall risk with standing ADLs Baseline: SEE OBJECTIVE DATA Goal status: 08/03/2022 updated    Patient ambulates >300' with prosthesis & LRAD modified independent Baseline: SEE OBJECTIVE DATA Goal status: ongoing 08/03/2022   Patient negotiates ramps, curbs & stairs with single  rail with prosthesis & LRAD modified independent. Baseline: SEE OBJECTIVE DATA Goal status: ongoing 08/03/22  PLAN: PT FREQUENCY: 2x/week   PT DURATION: 12 weeks  PLANNED INTERVENTIONS:  Therapeutic exercises, Therapeutic activity, Neuromuscular re-education, Balance training, Gait training, Patient/Family education, Self Care, Stair training, Vestibular training, Canalith repositioning, Prosthetic training, DME instructions, Re-evaluation, and physical performance testing    PLAN FOR NEXT SESSION:    check STGs, Progress standing balance working on compliant surfaces holding RW instead of //bars and prosthetic gait with rollator as tolerated with monitoring his respiratory response.   Jamey Reas, PT, DPT 09/21/2022, 8:46 AM

## 2022-09-23 ENCOUNTER — Ambulatory Visit: Payer: Medicare Other | Admitting: Internal Medicine

## 2022-09-23 ENCOUNTER — Encounter: Payer: Self-pay | Admitting: Internal Medicine

## 2022-09-23 ENCOUNTER — Ambulatory Visit (INDEPENDENT_AMBULATORY_CARE_PROVIDER_SITE_OTHER): Payer: Medicare Other | Admitting: Internal Medicine

## 2022-09-23 VITALS — BP 114/64 | HR 60 | Temp 98.2°F | Ht 72.0 in | Wt 241.6 lb

## 2022-09-23 DIAGNOSIS — J84112 Idiopathic pulmonary fibrosis: Secondary | ICD-10-CM

## 2022-09-23 DIAGNOSIS — R0689 Other abnormalities of breathing: Secondary | ICD-10-CM | POA: Diagnosis not present

## 2022-09-23 LAB — PULMONARY FUNCTION TEST
DL/VA % pred: 97 %
DL/VA: 3.73 ml/min/mmHg/L
DLCO cor % pred: 45 %
DLCO cor: 11.84 ml/min/mmHg
DLCO unc % pred: 45 %
DLCO unc: 11.84 ml/min/mmHg
FEF 25-75 Pre: 2.99 L/sec
FEF2575-%Pred-Pre: 141 %
FEV1-%Pred-Pre: 65 %
FEV1-Pre: 2.01 L
FEV1FVC-%Pred-Pre: 124 %
FEV6-%Pred-Pre: 56 %
FEV6-Pre: 2.27 L
FEV6FVC-%Pred-Pre: 106 %
FVC-%Pred-Pre: 52 %
FVC-Pre: 2.27 L
Pre FEV1/FVC ratio: 89 %
Pre FEV6/FVC Ratio: 100 %

## 2022-09-23 NOTE — Patient Instructions (Addendum)
ICD-10-CM   1. 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[place])  J84.112     2. Sighing respiration  R06.89       Mild steady progression  /worsening in lung function 2019 -> 2023, roughly 5% per year But overall you have done better against backdrop of all health issues I am not fully sure what to make of the "sigh" attacks that you have  -Did note this is a chronic ongoing issue  PLAN  - Test overnight pulse oximetry on room air - we will refer you to [facility] for consideration of PLIANT study that we discussed - 4 months do spiroemtry and dlco  Followup  -52months - 30 min visit; but after spiro/dlco   ILD symptoms score at followup  Can cancel if in research

## 2022-09-23 NOTE — Progress Notes (Signed)
PFT

## 2022-09-23 NOTE — Patient Instructions (Signed)
pft  

## 2022-09-23 NOTE — Progress Notes (Signed)
OV 09/23/2022  Subjective:  Patient ID: Shane [name], male , DOB: Dec 15, 1940 , age 81 y.o. , MRN: [id number] , ADDRESS: [place] [id number] PCP Burnard Bunting, MD Patient Care Team: Burnard Bunting, MD as PCP - General (Internal Medicine) Burnard Bunting, MD as PCP - Internal Medicine (Internal Medicine) Stanford Breed Denice Bors, MD as PCP - Cardiology (Cardiology) Evans Lance, MD as PCP - Electrophysiology (Cardiology)  This Provider for this visit: Treatment Team:  Attending Provider: Brand Males, MD    09/23/2022 -   Chief Complaint  Patient presents with   Follow-up    PFT performed today.  Pt states he has been doing okay since last visit. States he has had a little more SOB and also has had some problems with a cough.   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  Previously failed antifibrotic's  HPI [facility] 81 y.o. -currently on supportive care after Ze[facility] IV infusion study with pamrevlumab was terminated because of lack of efficacy.  Currently doing well.  Pulmonary function test done today shows a 21% decline in FVC over 5 years.  There is 5 %/year.  But nevertheless he is tolerating his pulmonary fibrosis well.  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  He has a right leg prosthesis.  He is working out with physical therapy and rehab.  He plans to start driving a car.  He did use excavator.  He does work on his Physicist, medical people to work.  He has had his flu shot.  Having been previously in clinical trials he is interested in clinical trials.  He does not want to rechallenge himself with antifibrotic.  1 issue that brought up is that he still continues to have the "sigh" episodes particularly at night.  The wife is concerned about this.  But they both admitted this was a chronic proble which they have brought up to me  in 2019.   SYMPTOM SCALE - ILD 09/23/2022  Current weight   O2 use ra  Shortness of Breath 0 -> 5 scale with 5 being worst (score 6 If unable to do)  At rest 0  Simple tasks - showers, clothes change, eating, shaving 0  Household (dishes, doing bed, laundry) 2  Shopping 2  Walking level at own pace 3  Walking up Stairs 3  Total (30-36) Dyspnea Score 10  How bad is your cough? 1  How bad is your fatigue 3  How bad is nausea 0  How bad is vomiting?  0  How bad is diarrhea? 0  How bad is anxiety? 0  How bad is depression 0  Any chronic pain - if so where and how bad x        CT Chest data  No results found.    PFT     Latest Ref Rng & Units 09/23/2022   12:29 PM 09/21/2018   12:08 PM 05/23/2018   10:37 AM 12/25/2017    9:01 AM 10/04/2017    9:35 AM 04/06/2017   10:13 AM 10/14/2016    8:19 AM  PFT Results  FVC-Pre L 2.27  P  2.89  P 2.88  2.94  2.94  3.00   FVC-Predicted Pre % 52  P  61  P 61  62  62  63   FVC-Post L  3.00   FVC-Predicted Post %       63   Pre FEV1/FVC % % 89  P  91  P 86  92  91  90   Post FEV1/FCV % %       92   FEV1-Pre L 2.01  P  2.62  P 2.47  2.69  2.69  2.70   FEV1-Predicted Pre % 65  P  77  P 72  79  78  78   FEV1-Post L       2.75   DLCO uncorrected ml/min/mmHg 11.84  P 14.95  17.13  P 14.56  15.04  16.43  16.82   DLCO UNC% % 45  P 42  47  P 40  41  45  46   DLCO corrected ml/min/mmHg 11.84  P 15.26  17.33  P 15.57  15.31  19.35    DLCO COR %Predicted % 45  P 43  47  P 42  42  53    DLVA Predicted % 97  P 83  91  P 83  79  99  81   TLC L       4.62   TLC % Predicted %       60   RV % Predicted %       60     P Preliminary result       has a past medical history of Anxiety, AV block, Mobitz 1, Cataract, Chronic kidney disease, CKD (chronic kidney disease), stage III (HCC), Depression, Diabetes mellitus, Dyspnea, GERD (gastroesophageal reflux disease), History of kidney stones, Hyperlipidemia, Hypertension, Idiopathic pulmonary  fibrosis (HCC) (11/2016), ILD (interstitial lung disease) ([place]), Moderate aortic stenosis, Neuromuscular disorder (HCC), Neuropathy associated with endocrine disorder ([place]), Nonobstructive CAD (coronary artery disease), OSA on CPAP (05/05/2017), PONV (postoperative nausea and vomiting), and Sleep apnea.   reports that he has never smoked. He has never used smokeless tobacco.  Past Surgical History:  Procedure Laterality Date   ABDOMINAL AORTOGRAM W/LOWER EXTREMITY N/A 12/10/2020   Procedure: ABDOMINAL AORTOGRAM W/LOWER EXTREMITY;  Surgeon: Cherre Robins, MD;  Location: [facility];  Service: Cardiovascular;  Laterality: N/A;   AMPUTATION Right 01/22/2021   Procedure: 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;  Surgeon: [name], MD;  Location: [place];  Service: Orthopedics;  Laterality: Right;   AMPUTATION Right 03/17/2021   Procedure: RIGHT BELOW KNEE AMPUTATION;  Surgeon: [name], MD;  Location: [place];  Service: Orthopedics;  Laterality: Right;   ANKLE FUSION Right 01/22/2021   Procedure: RIGHT FOOT TIBIOCALCANEAL FUSION;  Surgeon: [name], MD;  Location: [place];  Service: Orthopedics;  Laterality: Right;   ANTERIOR FUSION CERVICAL SPINE  2012   CARDIAC CATHETERIZATION  2011   CARDIAC CATHETERIZATION N/A 11/09/2016   Procedure: Right Heart Cath;  Surgeon: Belva Crome, MD;  Location: [facility];  Service: Cardiovascular;  Laterality: N/A;   carpel tunnel     left wrist   CATARACT EXTRACTION     CATARACT EXTRACTION W/ INTRAOCULAR LENS  IMPLANT, BILATERAL  2013   CERVICAL LAMINECTOMY  2012   COLONOSCOPY N/A 01/14/2013   Procedure: COLONOSCOPY;  Surgeon: Irene Shipper, MD;  Location: [facility];  Service: Endoscopy;  Laterality: N/A;   CORONARY ARTERY BYPASS GRAFT  11/04/2020   LIMA-LAD, SVG-OM1, SVG-PDA (Dr Marney [facility]) dc 11/18/2020   EYE SURGERY     I & D EXTREMITY Right 02/19/2021   Procedure: RIGHT ANKLE  DEBRIDEMENT AND PLACEMENT ANTIBIOTIC BEADS;  Surgeon: [name], MD;  Location: [place];  Service: Orthopedics;  Laterality: Right;   KNEE SURGERY  1998   left   LEFT HEART CATH AND CORONARY ANGIOGRAPHY N/A 07/10/2020   Procedure: LEFT HEART CATH AND CORONARY ANGIOGRAPHY;  Surgeon: Sherren Mocha, MD;  Location: [place] [facility];  Service: Cardiovascular;  Laterality: N/A;   LUMBAR LAMINECTOMY  2003   LUNG BIOPSY Left 12/26/2016   Procedure: LUNG BIOPSY;  Surgeon: [name], MD;  Location: [place];  Service: Thoracic;  Laterality: Left;   PACEMAKER IMPLANT N/A 03/30/2020   Procedure: PACEMAKER IMPLANT;  Surgeon: Evans Lance, MD;  Location: [place] [facility];  Service: Cardiovascular;  Laterality: N/A;   PERIPHERAL VASCULAR INTERVENTION Right 12/10/2020   Procedure: PERIPHERAL VASCULAR INTERVENTION;  Surgeon: Cherre Robins, MD;  Location: [place] [facility];  Service: Cardiovascular;  Laterality: Right;  SFA   POSTERIOR FUSION CERVICAL SPINE  2012   PPM GENERATOR REMOVAL N/A 12/14/2020   Procedure: PPM GENERATOR REMOVAL;  Surgeon: Evans Lance, MD;  Location: [place] [facility];  Service: Cardiovascular;  Laterality: N/A;   TEE WITHOUT CARDIOVERSION N/A 12/11/2020   Procedure: TRANSESOPHAGEAL ECHOCARDIOGRAM (TEE);  Surgeon: Geralynn Rile, MD;  Location: [place];  Service: Cardiovascular;  Laterality: N/A;   TRIGGER FINGER RELEASE  2011   4th finger left hand   VIDEO ASSISTED THORACOSCOPY Left 12/26/2016   Procedure: VIDEO ASSISTED THORACOSCOPY;  Surgeon: [name], MD;  Location: [place];  Service: Thoracic;  Laterality: Left;   VIDEO BRONCHOSCOPY N/A 12/26/2016   Procedure: VIDEO BRONCHOSCOPY;  Surgeon: [name], MD;  Location: [facility];  Service: Thoracic;  Laterality: N/A;    Allergies  Allergen Reactions   Codeine Hives and Itching   Ofev [Nintedanib] Diarrhea    SEVERE DIARRHEA   Pirfenidone Diarrhea and Other (See Comments)    Esbriet (Pirfenidone) causes elevated LFTs. D/C on 06/14/17 and  SEVERE DIARRHEA     Immunization History  Administered Date(s) Administered   Influenza Split 08/14/2017   Influenza, High Dose Seasonal PF 08/05/2016, 09/02/2019, 09/05/2022   Influenza-Unspecified 10/13/2020   PFIZER Comirnaty(Gray Top)Covid-19 Tri-Sucrose Vaccine 12/28/2019, 01/18/2020, 09/27/2020   PFIZER(Purple Top)SARS-COV-2 Vaccination 12/28/2019, 01/18/2020   Pneumococcal Conjugate-13 01/14/2015   Pneumococcal Polysaccharide-23 12/05/2014    Family History  Problem Relation Age of Onset   Diabetes Mellitus II Mother    Emphysema Father 42   Heart attack Father    Colon cancer Neg Hx    Esophageal cancer Neg Hx    Rectal cancer Neg Hx    Stomach cancer Neg Hx      Current Outpatient Medications:    acetaminophen (TYLENOL) 325 MG tablet, Take 1-2 tablets (325-650 mg total) by mouth every 6 (six) hours as needed for mild pain (pain score 1-3 or temp > 100.5)., Disp: , Rfl:    albuterol (VENTOLIN HFA) 108 (90 Base) MCG/ACT inhaler, Inhale 2 puffs into the lungs every 4 (four) hours as needed for wheezing or shortness of breath., Disp: 6.7 g, Rfl: 0   apixaban (ELIQUIS) 5 MG TABS tablet, Take 1 tablet (5 mg total) by mouth 2 (two) times daily., Disp: 60 tablet, Rfl: 11   Ascorbic Acid (VITAMIN C WITH ROSE HIPS) 500 MG tablet, Take 1 tablet (500 mg total) by mouth daily., Disp: 30 tablet, Rfl: 0   atorvastatin (LIPITOR) 80 MG tablet, TAKE 1 TABLET BY MOUTH EVERY DAY, Disp:  90 tablet, Rfl: 3   clopidogrel (PLAVIX) 75 MG tablet, Take 75 mg by mouth daily., Disp: , Rfl:    docusate sodium (COLACE) 100 MG capsule, Take 1 capsule (100 mg total) by mouth daily., Disp: 10 capsule, Rfl: 0   escitalopram (LEXAPRO) 10 MG tablet, Take 1 tablet (10 mg total) by mouth daily., Disp: 30 tablet, Rfl: 0   furosemide (LASIX) 40 MG tablet, Take 1 tablet (40 mg total) by mouth daily., Disp: 30 tablet, Rfl: 0   gabapentin (NEURONTIN) 300 MG capsule, TAKE 1 CAPSULE BY MOUTH THREE TIMES A DAY, Disp:  180 capsule, Rfl: 3   insulin aspart (NOVOLOG FLEXPEN) 100 UNIT/ML FlexPen, Inject 6 Units into the skin 3 (three) times daily with meals., Disp: 15 mL, Rfl: 11   insulin glargine (LANTUS) 100 UNIT/ML Solostar Pen, Inject 8 Units into the skin at bedtime., Disp: 15 mL, Rfl: 11   iron polysaccharides (NIFEREX) 150 MG capsule, Take 1 capsule (150 mg total) by mouth daily with lunch., Disp: 30 capsule, Rfl: 0   LORazepam (ATIVAN) 1 MG tablet, Take 1 tablet (1 mg total) by mouth at bedtime., Disp: 30 tablet, Rfl: 0   metoprolol tartrate (LOPRESSOR) 50 MG tablet, Take 0.5 tablets (25 mg total) by mouth daily., Disp: 30 tablet, Rfl: 0   Multiple Vitamin (MULTIVITAMIN WITH MINERALS) TABS tablet, Take 1 tablet by mouth daily., Disp: , Rfl:    Olopatadine HCl (PATADAY OP), Place 1 drop into both eyes daily as needed (allergies)., Disp: , Rfl:    pantoprazole (PROTONIX) 40 MG tablet, Take 1 tablet (40 mg total) by mouth 2 (two) times daily., Disp: 60 tablet, Rfl: 0   polyethylene glycol powder (GLYCOLAX/MIRALAX) 17 GM/SCOOP powder, Take 17 g by mouth daily as needed for moderate constipation., Disp: , Rfl:    Probiotic Product (PROBIOTIC PO), Take 1 capsule by mouth daily., Disp: , Rfl:    traMADol (ULTRAM) 50 MG tablet, Take 1 tablet (50 mg total) by mouth every 8 (eight) hours as needed for severe pain or moderate pain., Disp: 30 tablet, Rfl: 0   Zinc Oxide (TRIPLE PASTE) 12.8 % ointment, Apply 1 application topically 2 (two) times daily. Apply to affected area, Disp: 56.7 g, Rfl: 0   zinc sulfate 220 (50 Zn) MG capsule, Take 1 capsule (220 mg total) by mouth daily., Disp: 30 capsule, Rfl: 0      Objective:   Vitals:   09/23/22 1403  BP: 114/64  Pulse: 60  Temp: 98.2 F (36.8 C)  TempSrc: Oral  SpO2: 98%  Weight: 241 lb 9.6 oz (109.6 kg)  Height: 6' (1.829 m)    Estimated body mass index is 32.77 kg/m as calculated from the following:   Height as of this encounter: 6' (1.829 m).   Weight as  of this encounter: 241 lb 9.6 oz (109.6 kg).  @WEIGHTCHANGE @  [employer]   09/23/22 1403  Weight: 241 lb 9.6 oz (109.6 kg)     Physical Exam    General: No distress. Sitting in wheel chair. Loks well Neuro: Alert and Oriented x 3. GCS 15. Speech normal Psych: Pleasant Resp:  Barrel Chest - no.  Wheeze - no, Crackles - YES BASE, No overt respiratory distress CVS: Normal heart sounds. Murmurs - no Ext: Stigmata of Connective Tissue Disease - no. RLE prosteis . LLE boot HEENT: Normal upper airway. PEERL +. No post nasal drip        Assessment:       ICD-10-CM  1. 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HCC)  J84.112 Pulse oximetry, overnight    2. Sighing respiration  R06.89          Plan:     Patient Instructions     ICD-10-CM   1. 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[place])  J84.112     2. Sighing respiration  R06.89       Mild steady progression  /worsening in lung function 2019 -> 2023, roughly 5% per year But overall you have done better against backdrop of all health issues I am not fully sure what to make of the "sigh" attacks that you have  -Did note this is a chronic ongoing issue  PLAN  - Test overnight pulse oximetry on room air - we will refer you to [facility] for consideration of PLIANT study that we discussed - 4 months do spiroemtry and dlco  Followup  -15months - 30 min visit; but after spiro/dlco   ILD symptoms score at followup  Can cancel if in research     SIGNATURE    Dr. Brand Males, M.D., F.C.C.P,  Pulmonary and Critical Care Medicine Staff Physician, [place] Director - Interstitial Lung Disease  Program  Pulmonary [place] at [place], [place], [address]  Pager: [phone], If no answer or between  15:00h - 7:00h: call [phone] Telephone: [phone]  2:48 PM 09/23/2022

## 2022-09-26 ENCOUNTER — Ambulatory Visit (INDEPENDENT_AMBULATORY_CARE_PROVIDER_SITE_OTHER): Payer: Medicare Other | Admitting: Physical Therapy

## 2022-09-26 ENCOUNTER — Encounter: Payer: Self-pay | Admitting: Physical Therapy

## 2022-09-26 DIAGNOSIS — R2689 Other abnormalities of gait and mobility: Secondary | ICD-10-CM | POA: Diagnosis not present

## 2022-09-26 DIAGNOSIS — R2681 Unsteadiness on feet: Secondary | ICD-10-CM

## 2022-09-26 DIAGNOSIS — R293 Abnormal posture: Secondary | ICD-10-CM

## 2022-09-26 DIAGNOSIS — M6281 Muscle weakness (generalized): Secondary | ICD-10-CM

## 2022-09-26 NOTE — Therapy (Signed)
OUTPATIENT PHYSICAL THERAPY TREATMENT NOTE   Patient Name: Shane [name] MRN: [id number] DOB:[birth year], 81 y.o., male Today's Date: 09/26/2022  PCP: Burnard Bunting, MD REFERRING PROVIDER: [name], MD    END OF SESSION:   PT End of Session - 09/26/22 0805     Visit Number 38    Number of Visits 47    Date for PT Re-Evaluation 10/26/22    Authorization Type UHC Medicare    Authorization Time Period UHC MEDICARE  $20 COPAY    Progress Note Due on Visit 7    PT Start Time 0800    PT Stop Time 0843    PT Time Calculation (min) 43 min    Equipment Utilized During Treatment Gait belt    Activity Tolerance Patient tolerated treatment well;Patient limited by fatigue    Behavior During Therapy WFL for tasks assessed/performed                    Past Medical History:  Diagnosis Date   Anxiety    AV block, Mobitz 1    Cataract    Chronic kidney disease    d/t DM   CKD (chronic kidney disease), stage III ([place])    Depression    Diabetes mellitus    Vgo disposal insulin bolus  simular to insulin pump   Dyspnea    GERD (gastroesophageal reflux disease)    History of kidney stones    passed   Hyperlipidemia    Hypertension    Idiopathic pulmonary fibrosis ([place]) 11/2016   ILD (interstitial lung disease) ([place])    Moderate aortic stenosis    a. 10/2019 Echo: EF 55-60%, Gr2 DD. Nl RV.    Neuromuscular disorder ([place])    Neuropathy associated with endocrine disorder ([place])    Nonobstructive CAD (coronary artery disease)    a. 2012 Cath: mod, nonobs dzs; b. 10/2016 MV: EF 60%, no ischemia.   OSA on CPAP 05/05/2017   Unattended Home Sleep Test 7/2/813-AHI 38.6/hour, desaturation to 64%, body weight 261 pounds   PONV (postoperative nausea and vomiting)    Sleep apnea     uses cpap asked to bring mask and tubing   Past Surgical History:  Procedure Laterality Date   ABDOMINAL AORTOGRAM W/LOWER EXTREMITY N/A 12/10/2020   Procedure: ABDOMINAL AORTOGRAM W/LOWER  EXTREMITY;  Surgeon: Cherre Robins, MD;  Location: [place] [facility];  Service: Cardiovascular;  Laterality: N/A;   AMPUTATION Right 01/22/2021   Procedure: 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;  Surgeon: [name], MD;  Location: [place];  Service: Orthopedics;  Laterality: Right;   AMPUTATION Right 03/17/2021   Procedure: RIGHT BELOW KNEE AMPUTATION;  Surgeon: [name], MD;  Location: [place];  Service: Orthopedics;  Laterality: Right;   ANKLE FUSION Right 01/22/2021   Procedure: RIGHT FOOT TIBIOCALCANEAL FUSION;  Surgeon: [name], MD;  Location: [place];  Service: Orthopedics;  Laterality: Right;   ANTERIOR FUSION CERVICAL SPINE  2012   CARDIAC CATHETERIZATION  2011   CARDIAC CATHETERIZATION N/A 11/09/2016   Procedure: Right Heart Cath;  Surgeon: Belva Crome, MD;  Location: [place] [facility];  Service: Cardiovascular;  Laterality: N/A;   carpel tunnel     left wrist   CATARACT EXTRACTION     CATARACT EXTRACTION W/ INTRAOCULAR LENS  IMPLANT, BILATERAL  2013   CERVICAL LAMINECTOMY  2012   COLONOSCOPY N/A 01/14/2013   Procedure: COLONOSCOPY;  Surgeon: Irene Shipper, MD;  Location: [facility];  Service: Endoscopy;  Laterality: N/A;   CORONARY ARTERY BYPASS GRAFT  11/04/2020   LIMA-LAD, SVG-OM1, SVG-PDA (Dr [facility]) dc 11/18/2020   EYE SURGERY     I & D EXTREMITY Right 02/19/2021   Procedure: RIGHT ANKLE DEBRIDEMENT AND PLACEMENT ANTIBIOTIC BEADS;  Surgeon: [name], MD;  Location: [place];  Service: Orthopedics;  Laterality: Right;   KNEE SURGERY  1998   left   LEFT HEART CATH AND CORONARY ANGIOGRAPHY N/A 07/10/2020   Procedure: LEFT HEART CATH AND CORONARY ANGIOGRAPHY;  Surgeon: Sherren Mocha, MD;  Location: [place] [facility];  Service: Cardiovascular;  Laterality: N/A;   LUMBAR LAMINECTOMY  2003   LUNG BIOPSY Left 12/26/2016   Procedure: LUNG BIOPSY;  Surgeon: [name], MD;  Location: [place];  Service: Thoracic;  Laterality: Left;   PACEMAKER IMPLANT N/A  03/30/2020   Procedure: PACEMAKER IMPLANT;  Surgeon: Evans Lance, MD;  Location: [place] [facility];  Service: Cardiovascular;  Laterality: N/A;   PERIPHERAL VASCULAR INTERVENTION Right 12/10/2020   Procedure: PERIPHERAL VASCULAR INTERVENTION;  Surgeon: Cherre Robins, MD;  Location: [place] [facility];  Service: Cardiovascular;  Laterality: Right;  SFA   POSTERIOR FUSION CERVICAL SPINE  2012   PPM GENERATOR REMOVAL N/A 12/14/2020   Procedure: PPM GENERATOR REMOVAL;  Surgeon: Evans Lance, MD;  Location: [place] [facility];  Service: Cardiovascular;  Laterality: N/A;   TEE WITHOUT CARDIOVERSION N/A 12/11/2020   Procedure: TRANSESOPHAGEAL ECHOCARDIOGRAM (TEE);  Surgeon: Geralynn Rile, MD;  Location: [place];  Service: Cardiovascular;  Laterality: N/A;   TRIGGER FINGER RELEASE  2011   4th finger left hand   VIDEO ASSISTED THORACOSCOPY Left 12/26/2016   Procedure: VIDEO ASSISTED THORACOSCOPY;  Surgeon: [name], MD;  Location: [place];  Service: Thoracic;  Laterality: Left;   VIDEO BRONCHOSCOPY N/A 12/26/2016   Procedure: VIDEO BRONCHOSCOPY;  Surgeon: [name], MD;  Location: [place];  Service: Thoracic;  Laterality: N/A;   Patient Active Problem List   Diagnosis Date Noted   Intermediate stage nonexudative age-related macular degeneration of both eyes 04/25/2022   Research study patient 12/24/2021   Post-operative pain    CKD (chronic kidney disease), stage II    Labile blood glucose    Pulmonary fibrosis ([place])    Hyponatremia    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[place]) 03/26/2021   S/P BKA (below knee amputation) unilateral, right ([place])    Shortness of breath    Acute blood loss anemia    Stage 3 chronic kidney disease ([place])    Atrial fibrillation ([place])    Controlled type 2 diabetes mellitus with hyperglycemia, with long-term current use of insulin ([facility])    Postoperative pain    Postoperative hemorrhagic shock 03/20/2021   Atherosclerosis of native arteries of  extremities with gangrene, right leg ([place])    Hardware complicating wound infection ([place])    Abscess of bursa of right ankle 02/15/2021   Endocarditis 01/19/2021   Peripheral vascular disease ([place]) 12/29/2020   CHF (congestive heart failure), NYHA class II, chronic, diastolic ([place]) 37/29/0211   History of COVID-19 12/22/2020   SSS (sick sinus syndrome) ([place]) 12/22/2020   Acute bacterial endocarditis    MSSA bacteremia 12/09/2020   Diabetic foot ulcer ([place]) 12/09/2020   Foot drop, right foot    Generalized weakness 12/08/2020   Dehydration with hyponatremia 12/08/2020   Atrial fibrillation, chronic ([place]) 12/08/2020   Elevated troponin level not due myocardial infarction 12/08/2020  Adrenal insufficiency ([place]) 11/14/2020   Orthostatic hypotension 11/14/2020   Physical deconditioning 11/14/2020   Difficulty sleeping 11/07/2020   Postoperative anemia due to acute blood loss 11/07/2020   Right ventricular dysfunction 11/05/2020   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/04/2020   Hearing loss 11/03/2020   Cardiac device in situ 09/09/2020   Coronary atherosclerosis due to lipid rich plaque 09/02/2020   COVID-19 virus infection 07/18/2020   Coronary artery disease involving native heart without angina pectoris 07/10/2020   Chronic kidney disease, stage 3a ([place]) 03/29/2020   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/28/2020   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 ([place]) 03/16/2020   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 ([place]) 03/16/2020   Right epiretinal membrane 03/16/2020   Posterior vitreous detachment of right eye 03/16/2020   Aortic stenosis, moderate 03/12/2020   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   Near syncope 04/27/2018   Hypoglycemia due to insulin 01/06/2018   Essential hypertension 01/06/2018   Diabetes mellitus type 2, with complication, on long term insulin pump ([place]) 01/06/2018    Syncope and collapse 01/05/2018   Encounter for therapeutic drug monitoring 06/29/2017   OSA on CPAP 05/05/2017   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[place]) 01/05/2017   Abnormal chest x-ray 10/12/2016   Benign neoplasm of colon 01/14/2013    REFERRING DIAG: Z89.511 (ICD-10-CM) - Hx of right BKA   ONSET DATE: 05/04/2022 MD visit with release to [facility] on LLE  THERAPY DIAG:  Unsteadiness on feet  Other abnormalities of gait and mobility  Muscle weakness (generalized)  Abnormal posture  Rationale for Evaluation and Treatment Rehabilitation  PERTINENT HISTORY: CKD st III, AV block Mobitz, pacemaker, pulmonary fibrosis, CAD, CABG 21, HTN, A-Fib, DM2, PAD, right BKA, Left TMA  PRECAUTIONS: ICD/Pacemaker - continue monitoring vitals with activity and calculated HRmax is 151 bpm  SUBJECTIVE:  His wound on top of left foot is healed except one tiny point. Pulmonologist says his lung capacity has been declining ~5% / year and wants to monitor SpO2 overnight.    PAIN:    Are you having pain? Yes: NPRS scale: today  0/10, over last week lowest 0/10  Pain location: left shoulder lateral just distal to joint Pain description: tingling when shoulder in improper posture. Aggravating factors: improper posture. Relieving factors: correcting posture   OBJECTIVE: (objective measures completed at initial evaluation unless otherwise dated)  COGNITION: Overall cognitive status: Within functional limits for tasks assessed   POSTURE: 05/09/2022:  rounded shoulders, forward head, and flexed trunk    LOWER EXTREMITY ROM:    UPPER EXTREMITY ROM:  ROM P:passive  A:active Left 06/23/22 Supine all limited by pain Left 08/01/22 Seated All painfree Right 08/01/22 Seated  Shoulder flexion 30* A: 148 A: 153  Shoulder extension 0* neutral A: 72 A: 74  Shoulder abduction 45* A: 135 A: 141  Shoulder adduction     Shoulder internal rotation 20* A: 84 A: 89  Shoulder external rotation 10* A: 88 A: 83   Elbow flexion     Elbow extension -15*     (Blank rows = not tested)    ROM P:passive  A:active Right Eval 05/09/2022 Left Eval 05/09/2022 Bilateral 08/03/22  Hip flexion       Hip extension A: -25* standing A: -25* standing A: -15*  RW support standing  Hip abduction       Hip adduction       Hip  internal rotation       Hip external rotation       Knee flexion       Knee extension       Ankle dorsiflexion NA P: -17*   Ankle plantarflexion NA     Ankle inversion NA     Ankle eversion NA      (Blank rows = not tested)   LOWER EXTREMITY MMT:   MMT Right Eval 05/09/2022 Left Eval 05/09/2022  Hip flexion      Hip extension Functional testing 3-/5 Functional testing 3-/5  Hip abduction Functional testing 3-/5 Functional testing 3-/5  Hip adduction      Hip internal rotation      Hip external rotation      Knee flexion Functional testing 3-/5 Functional testing 3-/5  Knee extension Functional testing 3/5 Functional testing 3/5  Ankle dorsiflexion      Ankle plantarflexion      Ankle inversion      Ankle eversion      (Blank rows = not tested)   POSTURE:  06/23/2022  seated - head forward, rounded shoulders and posterior pelvic tilt  TRANSFERS: 05/09/2022:  Sit to stand: SBA and requires use of armrests from 20" stable w/c & needs RW for stabilization 05/09/2022:  Stand to sit: SBA and requires use of armrests from 20" stable w/c & needs RW for stabilization   GAIT: 08/11/2022: Pt amb 100' with rollator, right TTA prosthesis & left AFO partial shoe filler with SBA. He demonstrates increased step length bil., intermittent impaired foot clearance on RLE  08/03/2022: 2-minute Walk Test with RW, right TTA prosthesis & left AFO partial shoe filler. Resting pre HR 70 SpO2 100% HR after activity HR 52  SpO2 100% light amount of light headness Distance 158' with supervision.   Gait pattern: step-to pattern, but distance improved since evaluation, Right hip hike, trunk flexed, and  normal 4" BOS  Pt neg ramp & curb with RW, ight TTA prosthesis & left AFO partial shoe filler with supervision. He required seated rest half way due to fatigue.    05/09/2022:  Gait pattern: step to pattern, decreased step length- Left, decreased stance time- Right, Right hip hike, antalgic, lateral hip instability, trunk flexed, and wide BOS Distance walked: 25' Assistive device utilized: Environmental consultant - 2 wheeled and TTA prosthesis RLE Level of assistance: Min A Comments: pt reports dizziness with turning in gait   UE FUNCTION: 06/23/2022:  Pt reports that he uses RUE for fine motor like writing but uses both hands for gross motor or sports.  Seated - pt unable to raise LUE in flexion or abd due to pain.  Supine - pt unable to move LUE in flexion or abd due to pain.  PROM flexion > pain than abd   Pt tolerates elbow ext with palm supinated but with palm neutral causes shoulder pain. Also painful with elbow already extended and pronate palm.   PT simulated pushing on RW thru LUE with manual resistance.  Pt reports slight increase in pain but not as sharp as above function.    FUNCTIONAL TESTs:  Merrilee Jansky Balance Scale: 08/03/2022:  13/56 & tasks of Berg with RW support 45/56  [facility] PT Assessment - 08/03/22 0800                Berg Balance Test    Sit to Stand Needs minimal aid to stand or to stabilize   task with RW = 3    Standing  Unsupported Able to stand 30 seconds unsupported   task with RW = 4    Sitting with Back Unsupported but Feet Supported on Floor or Stool Able to sit safely and securely 2 minutes     Stand to Sit Controls descent by using hands   task with RW = 3    Transfers Able to transfer safely, definite need of hands     Standing Unsupported with Eyes Closed Unable to keep eyes closed 3 seconds but stays steady   task with RW = 4    Standing Unsupported with Feet Together Needs help to attain position and unable to hold for 15 seconds   task with RW = 3    From Standing, Reach  Forward with Outstretched Arm Reaches forward but needs supervision   task with RW = 4    From Standing Position, Pick up Object from Floor Unable to try/needs assist to keep balance   task with RW = 3    From Standing Position, Turn to Look Behind Over each Shoulder Needs assist to keep from losing balance and falling   task with RW = 3    Turn 360 Degrees Needs assistance while turning   task with RW = 2    Standing Unsupported, Alternately Place Feet on Step/Stool Needs assistance to keep from falling or unable to try   task with RW = 4    Standing Unsupported, One Foot in [employer] balance while stepping or standing   task with RW = 3    Standing on One Leg Unable to try or needs assist to prevent fall   task with RW = 4    Total Score 15     Berg comment: tasks of Berg with RW = 45/56        Berg Balance Scale: 05/09/2022:  9/56 & tasks of Berg with RW support 27/56    [facility] PT Assessment - 05/09/22 0930                Berg Balance Test    Sit to Stand Needs moderate or maximal assist to stand   task with RW support = 3    Standing Unsupported Unable to stand 30 seconds unassisted   task with RW support = 3    Sitting with Back Unsupported but Feet Supported on Floor or Stool Able to sit safely and securely 2 minutes     Stand to Sit Uses backs of legs against chair to control descent   task with RW support = 3    Transfers Able to transfer safely, definite need of hands     Standing Unsupported with Eyes Closed Needs help to keep from falling   task with RW support = 3    Standing Unsupported with Feet Together Needs help to attain position and unable to hold for 15 seconds   task with RW support = 2    From Standing, Reach Forward with Outstretched Arm Loses balance while trying/requires external support   task with RW support = 1    From Standing Position, Pick up Object from Floor Unable to try/needs assist to keep balance   task with RW support = 2    From Standing Position,  Turn to Look Behind Over each Shoulder Needs assist to keep from losing balance and falling   task with RW support = 1    Turn 360 Degrees Needs assistance while turning   task with RW support =  0    Standing Unsupported, Alternately Place Feet on Step/Stool Needs assistance to keep from falling or unable to try   task with RW support = 0    Standing Unsupported, One Foot in [employer] balance while stepping or standing   task with RW support = 1    Standing on One Leg Unable to try or needs assist to prevent fall   task with RW support = 1    Total Score 9     Berg comment: tasks of Berg with RW support 27/56                     CURRENT PROSTHETIC WEAR ASSESSMENT: 08/03/2022: Pt wears both right TTA prosthesis & left AFO partial shoe filler>90% of awake hours.  He has 2 wounds on dorsum of left foot being monitored by PCP.  He is wearing left Vive Wear compression sock below knee on LLE which has helped wounds to heal. Pt is independent in donning & doffing both prosthesis & AFO. Skin check, cleaning and minor cues on adjusting ply socks.   05/09/2022:   Patient is dependent with: skin check, residual limb care, care of non-amputated limb, prosthetic cleaning, ply sock cleaning, and correct ply sock adjustment Donning prosthesis: Modified independence Doffing prosthesis: Modified independence Prosthetic wear tolerance: >90% of awake hours/day, 7 days/week Prosthetic weight bearing tolerance: 5 minutes Edema: LLE pitting edema with >10 sec capillary refill Residual limb condition: Right BKA limb has no openings, normal temperature & moisture.  Redness patella & patella tendon from friction of liner with knee flexed. Cylinderical shape.  LLE TMA pt's wife showed picture of wound on dorsum of foot that is being managed by PCP.   Prosthetic description: silicon liner with pin lock suspension, total contact socket, dynamic response foot.      TODAY'S TREATMENT:  09/26/2022 Therapeutic  exercise: PT instructed with demo & verbal cues on hamstring stretch with foot on chair and heel on floor with strap DF 20 sec hold 2 reps ea Gastroc stretch on 4" step heel depression 30 sec BLEs 1 reps  Neuromuscular Re-ed: Picking up item from floor with RUE on mid-bar of RW with supervision. Standing feet hip width apart crossways on foam beam with single UE support //bar with single UE red theraband resistance working on upright posture: 10 reps ea UE for forward reach, row & biceps curl. Standing crossways on foam beam with light BUE support static with eyes closed 10 sec 3 reps Standing crossways on foam beam with light UE support feet hip width: reaching single UE knee to overhead & reaching behind with trunk rotation following hand with eyes 10 rep ea. Standing crossways on foam beam with light BUE support using mirror for upright posture alternating LEs 5 reps ea - forward kicks, abduction, extension & adduction both ant/post to stance LE. Rocker board with BUE support - ant/level/post & right/level/left  initially with round single pivot board.  Pt continues to need short frequent rests between activities due to [facility]  09/21/2022 Neuromuscular Re-ed: Standing feet hip width apart on floor with single UE support //bar with single UE red theraband resistance working on upright posture: 10 reps ea UE for forward reach, row & biceps curl. Standing feet hip width apart on foam with single UE support //bar with single UE red theraband resistance working on upright posture: 10 reps ea UE for forward reach, row & biceps curl. Standing on foam with light BUE support static  with eyes closed 10 sec 3 reps Standing on foam with light UE support feet hip width: reaching single UE knee to overhead & reaching behind with trunk rotation following hand with eyes 10 rep ea. Standing on foam with light BUE support using mirror for upright posture alternating LEs 5 reps ea - forward kicks, abduction,  extension & adduction both ant/post to stance LE. Rocker board with BUE support - ant/level/post & right/level/left  initially with round single pivot board. Step up & down leading with BLEs with BUE support //bars on 4" step 5 reps ea LE Backwards gait in //bars with PT cues on technique 8' X 2 Side stepping right & left in //bars with PT cues on technique 8' X 2  Pt continues to need short frequent rests between activities due to [facility]  09/19/2022 Neuromuscular Re-ed: Standing feet hip width apart on floor with single UE support //bar with single UE red theraband resistance working on upright posture: 10 reps ea UE for forward reach, row & biceps curl. Standing feet hip width apart on foam with single UE support //bar with single UE red theraband resistance working on upright posture: 10 reps ea UE for forward reach, row & biceps curl. Standing on foam with light BUE support static with eyes closed 10 sec 3 reps Standing on foam with light BUE support using mirror for upright posture alternating LEs 5 reps ea - marching, forward kicks, abduction, extension & adduction both ant/post to stance LE. Rocker board with BUE support - ant/level/post & right/level/left  initially with squard 2 pivot board & progressed to round single pivot board. Backwards gait in //bars with PT cues on technique 8' X 2 Side stepping right & left in //bars with PT cues on technique 8' X 2  09/16/2022 -Ambulation with RW and SBA 75 feet X 2, then 100 feet with longer rest break to catch his breath.  Neuromuscular Re-ed: Standing with back of hands on //bars to minimize UE weight bearing:  head turns and head nods X 15 each- Stepping onto foam mat for stance and tapping cone anterior to foam mat with contralateral LE for 10 reps ea LE with BUE support Stepping onto foam mat for stance and tapping cone across midline for hip adduction to foam mat with contralateral LE for 10 reps ea LE with BUE support Stepping onto  foam mat for stance and tapping cone anterior-lateral for hip abduction to foam mat with contralateral LE for 15 reps ea LE with BUE support Sit to/from stand without UE assist from 24" stool with minA/tactile cues 5 reps Backwards gait in //bars with PT cues on technique 8' X 4 Side stepping right & left in //bars with PT cues on technique 8' X 4     HOME EXERCISE PROGRAM  Access Code: [email or web address] URL: [email or web address] Date: 07/05/2022 Prepared by: Jamey Reas  Exercises - Seated Correct Posture  - 1 x daily - 7 x weekly - Seated Scapular Retraction  - 2-5 x daily - 7 x weekly - 1 sets - 10 reps - 5 seconds hold - Supine Chin Tucks on Flat Ball  - 2-5 x daily - 7 x weekly - 1 sets - 10 reps - 5 seconds hold - Seated Isometric Cervical Retraction with Chin Tucks with Pillow behind head  - 2-5 x daily - 7 x weekly - 1 sets - 10 reps - 5 seconds hold - Circular Shoulder Pendulum with Table Support  - 1-2 x daily - 7  x weekly - 1-2 sets - 10 reps - 5 seconds hold - Flexion-Extension Shoulder Pendulum with Table Support  - 1-2 x daily - 7 x weekly - 1-2 sets - 10 reps - Horizontal Shoulder Pendulum with Table Support  - 1-2 x daily - 7 x weekly - 1-2 sets - 10 reps - Supine Shoulder Flexion Extension AAROM with Dowel  - 1 x daily - 7 x weekly - 1-2 sets - 10 reps - 5 seconds hold - Supine Shoulder Abduction AAROM with Dowel  - 1 x daily - 7 x weekly - 1-2 sets - 10 reps - 5 seconds hold - Supine Shoulder External Rotation with Dowel  - 1 x daily - 7 x weekly - 1-2 sets - 10 reps - 5 seconds hold   Neuromuscular Reed-  cues on proprioception RLE using residual limb, balance & standing tolerance.   Do each exercise 1-2  times per day Do each exercise 5-10 repetitions Hold each exercise for 2 seconds to feel your location  AT [place].  Try to find this position when standing still for activities.    USE TAPE ON FLOOR TO MARK THE MIDLINE POSITION which is even with middle of sink.  You also should try to feel with your limb pressure in socket.  You are trying to feel with limb what you used to feel with the bottom of your foot.  Side to Side Shift: Moving your hips only (not shoulders): move weight onto your left leg, HOLD/FEEL pressure in socket.  Move back to equal weight on each leg, HOLD/FEEL pressure in socket. Move weight onto your right leg, HOLD/FEEL pressure in socket. Move back to equal weight on each leg, HOLD/FEEL pressure in socket. Repeat.  Start with both hands on sink, progress to hand on prosthetic side only, then no hands.  Front to Back Shift: Moving your hips only (not shoulders): move your weight forward onto your toes, HOLD/FEEL pressure in socket. Move your weight back to equal Flat Foot on both legs, HOLD/FEEL  pressure in socket. Move your weight back onto your heels, HOLD/FEEL  pressure in socket. Move your weight back to equal on both legs, HOLD/FEEL  pressure in socket. Repeat.  Start with both hands on sink, progress to hand on prosthetic side only, then no hands.  Moving Cones / Cups: With equal weight on each leg: Hold on with one hand the first time, then progress to no hand supports. Move cups from one side of sink to the other. Place cups ~2" out of your reach, progress to 10" beyond reach.  Place one hand in middle of sink and reach with other hand. Do both arms.  Then hover one hand and move cups with other hand.  Overhead/Upward Reaching: alternated reaching up to top cabinets or ceiling if no cabinets present. Keep equal weight on each leg. Start with one hand support on counter while other hand reaches and progress to no hand support with reaching.  ace one hand in middle of sink and reach with other hand. Do both arms.  Then hover one hand and move cups with other hand.  5.   Looking Over Shoulders: With equal weight on each leg: alternate turning to look over  your shoulders with one hand support on counter as needed.  Start with head motions only to look in front of shoulder, then even with shoulder and progress to looking behind  you. To look to side, move head /eyes, then shoulder on side looking pulls back, shift more weight to side looking and pull hip back. Place one hand in middle of sink and let go with other hand so your shoulder can pull back. Switch hands to look other way.   Then hover one hand and look over shoulder. If looking right, use left hand at sink. If looking left, use right hand at sink. 6.  Alternate Tapping Toes or Stepping to lower shelf of cabinet  Move items under cabinet out of your way. Shift your hips/pelvis so weight on stance leg & Tighten muscles in hip.  SLOWLY step other leg so front of foot is in cabinet. Then step back to floor. Repeat with other leg.     ASSESSMENT:   CLINICAL IMPRESSION: PT advanced standing balance to beam for compliant surfaces which he tolerated well.  Patient continues to be limited by [facility] but recovers quickly with short rest. Pt continues to benefit from skilled PT  OBJECTIVE IMPAIRMENTS .Abnormal gait, cardiopulmonary status limiting activity, decreased activity tolerance, decreased balance, decreased endurance, decreased knowledge of use of DME, decreased mobility, decreased ROM, decreased strength, dizziness, increased edema, impaired flexibility, postural dysfunction, prosthetic dependency , obesity, and pain.    ACTIVITY LIMITATIONS carrying, lifting, bending, standing, stairs, transfers, locomotion level, UE use with ADLs, sleep    PARTICIPATION LIMITATIONS: driving, community activity, and yard work, UE use with ADLs &  household activities   [place], Time since onset of injury/illness/exacerbation, and 3+ comorbidities: see PMH  are also affecting patient's functional outcome.    REHAB POTENTIAL: Good   CLINICAL DECISION MAKING: Evolving/moderate complexity    EVALUATION COMPLEXITY: Moderate     GOALS: Goals reviewed with patient? Yes  SHORT TERM GOALS: Target date: 09/29/2022  1. Patient able to pick up items from floor with single UE support on walker or surface. Baseline: SEE OBJECTIVE DATA Goal status: met 09/26/2022  2. Patient reports ambulating in home & entering/exiting home >90% of time over w/c use.  Baseline: SEE OBJECTIVE DATA Goal status: set 09/08/2022  3. Patient ambulates up to 200' with RW, prosthesis & AFO with supervision.  Goal status: ongoing 09/01/2022     UPDATED LONG TERM GOALS: Target date: 10/26/2022   Patient demonstrates & verbalized understanding of prosthetic care to enable safe utilization of prosthesis. Baseline: SEE OBJECTIVE DATA Goal status: ongoing 08/03/22    Patient tolerates right prosthesis & left AFO / partial foot prosthesis wear >90% of awake hours without skin or limb pain issues. Baseline: SEE OBJECTIVE DATA Goal status: ongoing 08/03/22    Berg Balance >/= 20/56 and tasks of Berg with RW support >/= 52/56 to indicate lower fall risk with standing ADLs Baseline: SEE OBJECTIVE DATA Goal status: 08/03/2022 updated    Patient ambulates >300' with prosthesis & LRAD modified independent Baseline: SEE OBJECTIVE DATA Goal status: ongoing 08/03/2022   Patient negotiates ramps, curbs & stairs with single rail with prosthesis & LRAD modified independent. Baseline: SEE OBJECTIVE DATA Goal status: ongoing 08/03/22  PLAN: PT FREQUENCY: 2x/week   PT DURATION: 12 weeks  PLANNED INTERVENTIONS: Therapeutic exercises, Therapeutic activity, Neuromuscular re-education, Balance training, Gait training, Patient/Family education, Self Care, Stair training, Vestibular training, Canalith repositioning, Prosthetic training, DME instructions, Re-evaluation, and physical performance testing    PLAN FOR NEXT SESSION:     check gait STG, Progress standing balance working on compliant surfaces holding RW instead of  //bars and prosthetic gait with rollator  as tolerated with monitoring his respiratory response.   Jamey Reas, PT, DPT 09/26/2022, 9:39 AM

## 2022-09-28 ENCOUNTER — Ambulatory Visit (INDEPENDENT_AMBULATORY_CARE_PROVIDER_SITE_OTHER): Payer: Medicare Other | Admitting: Physical Therapy

## 2022-09-28 ENCOUNTER — Encounter: Payer: Self-pay | Admitting: Physical Therapy

## 2022-09-28 DIAGNOSIS — R2689 Other abnormalities of gait and mobility: Secondary | ICD-10-CM | POA: Diagnosis not present

## 2022-09-28 DIAGNOSIS — R2681 Unsteadiness on feet: Secondary | ICD-10-CM

## 2022-09-28 DIAGNOSIS — M6281 Muscle weakness (generalized): Secondary | ICD-10-CM | POA: Diagnosis not present

## 2022-09-28 DIAGNOSIS — R293 Abnormal posture: Secondary | ICD-10-CM

## 2022-09-28 NOTE — Therapy (Signed)
OUTPATIENT PHYSICAL THERAPY TREATMENT NOTE   Patient Name: Shane [name] MRN: [id number] DOB:1941-03-23, 81 y.o., male Today's Date: 09/28/2022  PCP: Burnard Bunting, MD REFERRING PROVIDER: [name], MD    END OF SESSION:   PT End of Session - 09/28/22 0808     Visit Number 39    Number of Visits 47    Date for PT Re-Evaluation 10/26/22    Authorization Type UHC Medicare    Authorization Time Period UHC MEDICARE  $20 COPAY    Progress Note Due on Visit 27    PT Start Time 0800    PT Stop Time 0844    PT Time Calculation (min) 44 min    Equipment Utilized During Treatment Gait belt    Activity Tolerance Patient tolerated treatment well;Patient limited by fatigue    Behavior During Therapy WFL for tasks assessed/performed                     Past Medical History:  Diagnosis Date   Anxiety    AV block, Mobitz 1    Cataract    Chronic kidney disease    d/t DM   CKD (chronic kidney disease), stage III ([place])    Depression    Diabetes mellitus    Vgo disposal insulin bolus  simular to insulin pump   Dyspnea    GERD (gastroesophageal reflux disease)    History of kidney stones    passed   Hyperlipidemia    Hypertension    Idiopathic pulmonary fibrosis ([place]) 11/2016   ILD (interstitial lung disease) ([place])    Moderate aortic stenosis    a. 10/2019 Echo: EF 55-60%, Gr2 DD. Nl RV.    Neuromuscular disorder ([place])    Neuropathy associated with endocrine disorder ([place])    Nonobstructive CAD (coronary artery disease)    a. 2012 Cath: mod, nonobs dzs; b. 10/2016 MV: EF 60%, no ischemia.   OSA on CPAP 05/05/2017   Unattended Home Sleep Test 7/2/813-AHI 38.6/hour, desaturation to 64%, body weight 261 pounds   PONV (postoperative nausea and vomiting)    Sleep apnea     uses cpap asked to bring mask and tubing   Past Surgical History:  Procedure Laterality Date   ABDOMINAL AORTOGRAM W/LOWER EXTREMITY N/A 12/10/2020   Procedure: ABDOMINAL AORTOGRAM W/LOWER  EXTREMITY;  Surgeon: Cherre Robins, MD;  Location: [facility];  Service: Cardiovascular;  Laterality: N/A;   AMPUTATION Right 01/22/2021   Procedure: 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;  Surgeon: [name], MD;  Location: [facility];  Service: Orthopedics;  Laterality: Right;   AMPUTATION Right 03/17/2021   Procedure: RIGHT BELOW KNEE AMPUTATION;  Surgeon: [name], MD;  Location: [facility];  Service: Orthopedics;  Laterality: Right;   ANKLE FUSION Right 01/22/2021   Procedure: RIGHT FOOT TIBIOCALCANEAL FUSION;  Surgeon: [name], MD;  Location: [facility];  Service: Orthopedics;  Laterality: Right;   ANTERIOR FUSION CERVICAL SPINE  2012   CARDIAC CATHETERIZATION  2011   CARDIAC CATHETERIZATION N/A 11/09/2016   Procedure: Right Heart Cath;  Surgeon: Belva Crome, MD;  Location: [facility];  Service: Cardiovascular;  Laterality: N/A;   carpel tunnel     left wrist   CATARACT EXTRACTION     CATARACT EXTRACTION W/ INTRAOCULAR LENS  IMPLANT, BILATERAL  2013   CERVICAL LAMINECTOMY  2012   COLONOSCOPY N/A 01/14/2013   Procedure: COLONOSCOPY;  Surgeon: Irene Shipper, MD;  Location: [facility];  Service: Endoscopy;  Laterality: N/A;   CORONARY ARTERY BYPASS GRAFT  11/04/2020   LIMA-LAD, SVG-OM1, SVG-PDA (Dr Marney [facility]) dc 11/18/2020   EYE SURGERY     I & D EXTREMITY Right 02/19/2021   Procedure: RIGHT ANKLE DEBRIDEMENT AND PLACEMENT ANTIBIOTIC BEADS;  Surgeon: [name], MD;  Location: [place];  Service: Orthopedics;  Laterality: Right;   KNEE SURGERY  1998   left   LEFT HEART CATH AND CORONARY ANGIOGRAPHY N/A 07/10/2020   Procedure: LEFT HEART CATH AND CORONARY ANGIOGRAPHY;  Surgeon: Sherren Mocha, MD;  Location: [facility];  Service: Cardiovascular;  Laterality: N/A;   LUMBAR LAMINECTOMY  2003   LUNG BIOPSY Left 12/26/2016   Procedure: LUNG BIOPSY;  Surgeon: [name], MD;  Location: [facility];  Service: Thoracic;  Laterality: Left;   PACEMAKER IMPLANT N/A  03/30/2020   Procedure: PACEMAKER IMPLANT;  Surgeon: Evans Lance, MD;  Location: [facility];  Service: Cardiovascular;  Laterality: N/A;   PERIPHERAL VASCULAR INTERVENTION Right 12/10/2020   Procedure: PERIPHERAL VASCULAR INTERVENTION;  Surgeon: Cherre Robins, MD;  Location: [facility];  Service: Cardiovascular;  Laterality: Right;  SFA   POSTERIOR FUSION CERVICAL SPINE  2012   PPM GENERATOR REMOVAL N/A 12/14/2020   Procedure: PPM GENERATOR REMOVAL;  Surgeon: Evans Lance, MD;  Location: [facility];  Service: Cardiovascular;  Laterality: N/A;   TEE WITHOUT CARDIOVERSION N/A 12/11/2020   Procedure: TRANSESOPHAGEAL ECHOCARDIOGRAM (TEE);  Surgeon: Geralynn Rile, MD;  Location: [facility];  Service: Cardiovascular;  Laterality: N/A;   TRIGGER FINGER RELEASE  2011   4th finger left hand   VIDEO ASSISTED THORACOSCOPY Left 12/26/2016   Procedure: VIDEO ASSISTED THORACOSCOPY;  Surgeon: [name], MD;  Location: [facility];  Service: Thoracic;  Laterality: Left;   VIDEO BRONCHOSCOPY N/A 12/26/2016   Procedure: VIDEO BRONCHOSCOPY;  Surgeon: [name], MD;  Location: [place] [facility];  Service: Thoracic;  Laterality: N/A;   Patient Active Problem List   Diagnosis Date Noted   Intermediate stage nonexudative age-related macular degeneration of both eyes 04/25/2022   Research study patient 12/24/2021   Post-operative pain    CKD (chronic kidney disease), stage II    Labile blood glucose    Pulmonary fibrosis ([place])    Hyponatremia    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[place]) 03/26/2021   S/P BKA (below knee amputation) unilateral, right ([place])    Shortness of breath    Acute blood loss anemia    Stage 3 chronic kidney disease ([place])    Atrial fibrillation ([place])    Controlled type 2 diabetes mellitus with hyperglycemia, with long-term current use of insulin (H[facility])    Postoperative pain    Postoperative hemorrhagic shock 03/20/2021   Atherosclerosis of native arteries of  extremities with gangrene, right leg ([place])    Hardware complicating wound infection ([place])    Abscess of bursa of right ankle 02/15/2021   Endocarditis 01/19/2021   Peripheral vascular disease ([place]) 12/29/2020   CHF (congestive heart failure), NYHA class II, chronic, diastolic ([place]) 65/99/3570   History of COVID-19 12/22/2020   SSS (sick sinus syndrome) ([place]) 12/22/2020   Acute bacterial endocarditis    MSSA bacteremia 12/09/2020   Diabetic foot ulcer ([place]) 12/09/2020   Foot drop, right foot    Generalized weakness 12/08/2020   Dehydration with hyponatremia 12/08/2020   Atrial fibrillation, chronic ([place]) 12/08/2020   Elevated troponin level not due myocardial infarction 12/08/2020  Adrenal insufficiency ([place]) 11/14/2020   Orthostatic hypotension 11/14/2020   Physical deconditioning 11/14/2020   Difficulty sleeping 11/07/2020   Postoperative anemia due to acute blood loss 11/07/2020   Right ventricular dysfunction 11/05/2020   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/04/2020   Hearing loss 11/03/2020   Cardiac device in situ 09/09/2020   Coronary atherosclerosis due to lipid rich plaque 09/02/2020   COVID-19 virus infection 07/18/2020   Coronary artery disease involving native heart without angina pectoris 07/10/2020   Chronic kidney disease, stage 3a ([place]) 03/29/2020   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/28/2020   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 ([place]) 03/16/2020   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 ([place]) 03/16/2020   Right epiretinal membrane 03/16/2020   Posterior vitreous detachment of right eye 03/16/2020   Aortic stenosis, moderate 03/12/2020   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   Near syncope 04/27/2018   Hypoglycemia due to insulin 01/06/2018   Essential hypertension 01/06/2018   Diabetes mellitus type 2, with complication, on long term insulin pump ([place]) 01/06/2018    Syncope and collapse 01/05/2018   Encounter for therapeutic drug monitoring 06/29/2017   OSA on CPAP 05/05/2017   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[place]) 01/05/2017   Abnormal chest x-ray 10/12/2016   Benign neoplasm of colon 01/14/2013    REFERRING DIAG: Z89.511 (ICD-10-CM) - Hx of right BKA   ONSET DATE: 05/04/2022 MD visit with release to WB on LLE  THERAPY DIAG:  Unsteadiness on feet  Other abnormalities of gait and mobility  Muscle weakness (generalized)  Abnormal posture  Rationale for Evaluation and Treatment Rehabilitation  PERTINENT HISTORY: CKD st III, AV block Mobitz, pacemaker, pulmonary fibrosis, CAD, CABG 21, HTN, A-Fib, DM2, PAD, right BKA, Left TMA  PRECAUTIONS: ICD/Pacemaker - continue monitoring vitals with activity and calculated HRmax is 151 bpm  SUBJECTIVE:  He is planning to go to their beach house next weekend.    PAIN:    Are you having pain? Yes: NPRS scale: today  0/10, over last week lowest 0/10  Pain location: left shoulder lateral just distal to joint Pain description: tingling when shoulder in improper posture. Aggravating factors: improper posture. Relieving factors: correcting posture   OBJECTIVE: (objective measures completed at initial evaluation unless otherwise dated)  COGNITION: Overall cognitive status: Within functional limits for tasks assessed   POSTURE: 05/09/2022:  rounded shoulders, forward head, and flexed trunk    LOWER EXTREMITY ROM:    UPPER EXTREMITY ROM:  ROM P:passive  A:active Left 06/23/22 Supine all limited by pain Left 08/01/22 Seated All painfree Right 08/01/22 Seated  Shoulder flexion 30* A: 148 A: 153  Shoulder extension 0* neutral A: 72 A: 74  Shoulder abduction 45* A: 135 A: 141  Shoulder adduction     Shoulder internal rotation 20* A: 84 A: 89  Shoulder external rotation 10* A: 88 A: 83  Elbow flexion     Elbow extension -15*     (Blank rows = not tested)    ROM P:passive  A:active  Right Eval 05/09/2022 Left Eval 05/09/2022 Bilateral 08/03/22  Hip flexion       Hip extension A: -25* standing A: -25* standing A: -15*  RW support standing  Hip abduction       Hip adduction       Hip internal rotation       Hip external rotation       Knee flexion  Knee extension       Ankle dorsiflexion NA P: -17*   Ankle plantarflexion NA     Ankle inversion NA     Ankle eversion NA      (Blank rows = not tested)   LOWER EXTREMITY MMT:   MMT Right Eval 05/09/2022 Left Eval 05/09/2022  Hip flexion      Hip extension Functional testing 3-/5 Functional testing 3-/5  Hip abduction Functional testing 3-/5 Functional testing 3-/5  Hip adduction      Hip internal rotation      Hip external rotation      Knee flexion Functional testing 3-/5 Functional testing 3-/5  Knee extension Functional testing 3/5 Functional testing 3/5  Ankle dorsiflexion      Ankle plantarflexion      Ankle inversion      Ankle eversion      (Blank rows = not tested)   POSTURE:  06/23/2022  seated - head forward, rounded shoulders and posterior pelvic tilt  TRANSFERS: 05/09/2022:  Sit to stand: SBA and requires use of armrests from 20" stable w/c & needs RW for stabilization 05/09/2022:  Stand to sit: SBA and requires use of armrests from 20" stable w/c & needs RW for stabilization   GAIT: 08/11/2022: Pt amb 100' with rollator, right TTA prosthesis & left AFO partial shoe filler with SBA. He demonstrates increased step length bil., intermittent impaired foot clearance on RLE  08/03/2022: 2-minute Walk Test with RW, right TTA prosthesis & left AFO partial shoe filler. Resting pre HR 70 SpO2 100% HR after activity HR 52  SpO2 100% light amount of light headness Distance 158' with supervision.   Gait pattern: step-to pattern, but distance improved since evaluation, Right hip hike, trunk flexed, and normal 4" BOS  Pt neg ramp & curb with RW, ight TTA prosthesis & left AFO partial shoe filler with  supervision. He required seated rest half way due to fatigue.    05/09/2022:  Gait pattern: step to pattern, decreased step length- Left, decreased stance time- Right, Right hip hike, antalgic, lateral hip instability, trunk flexed, and wide BOS Distance walked: 25' Assistive device utilized: Environmental consultant - 2 wheeled and TTA prosthesis RLE Level of assistance: Min A Comments: pt reports dizziness with turning in gait   UE FUNCTION: 06/23/2022:  Pt reports that he uses RUE for fine motor like writing but uses both hands for gross motor or sports.  Seated - pt unable to raise LUE in flexion or abd due to pain.  Supine - pt unable to move LUE in flexion or abd due to pain.  PROM flexion > pain than abd   Pt tolerates elbow ext with palm supinated but with palm neutral causes shoulder pain. Also painful with elbow already extended and pronate palm.   PT simulated pushing on RW thru LUE with manual resistance.  Pt reports slight increase in pain but not as sharp as above function.    FUNCTIONAL TESTs:  Merrilee Jansky Balance Scale: 08/03/2022:  13/56 & tasks of Berg with RW support 45/56  [facility] PT Assessment - 08/03/22 0800                Berg Balance Test    Sit to Stand Needs minimal aid to stand or to stabilize   task with RW = 3    Standing Unsupported Able to stand 30 seconds unsupported   task with RW = 4    Sitting with Back Unsupported but Feet Supported on  Floor or Stool Able to sit safely and securely 2 minutes     Stand to Sit Controls descent by using hands   task with RW = 3    Transfers Able to transfer safely, definite need of hands     Standing Unsupported with Eyes Closed Unable to keep eyes closed 3 seconds but stays steady   task with RW = 4    Standing Unsupported with Feet Together Needs help to attain position and unable to hold for 15 seconds   task with RW = 3    From Standing, Reach Forward with Outstretched Arm Reaches forward but needs supervision   task with RW = 4    From Standing  Position, Pick up Object from Floor Unable to try/needs assist to keep balance   task with RW = 3    From Standing Position, Turn to Look Behind Over each Shoulder Needs assist to keep from losing balance and falling   task with RW = 3    Turn 360 Degrees Needs assistance while turning   task with RW = 2    Standing Unsupported, Alternately Place Feet on Step/Stool Needs assistance to keep from falling or unable to try   task with RW = 4    Standing Unsupported, One Foot in [employer] balance while stepping or standing   task with RW = 3    Standing on One Leg Unable to try or needs assist to prevent fall   task with RW = 4    Total Score 15     Berg comment: tasks of Berg with RW = 45/56        Berg Balance Scale: 05/09/2022:  9/56 & tasks of Berg with RW support 27/56    [facility] PT Assessment - 05/09/22 0930                Berg Balance Test    Sit to Stand Needs moderate or maximal assist to stand   task with RW support = 3    Standing Unsupported Unable to stand 30 seconds unassisted   task with RW support = 3    Sitting with Back Unsupported but Feet Supported on Floor or Stool Able to sit safely and securely 2 minutes     Stand to Sit Uses backs of legs against chair to control descent   task with RW support = 3    Transfers Able to transfer safely, definite need of hands     Standing Unsupported with Eyes Closed Needs help to keep from falling   task with RW support = 3    Standing Unsupported with Feet Together Needs help to attain position and unable to hold for 15 seconds   task with RW support = 2    From Standing, Reach Forward with Outstretched Arm Loses balance while trying/requires external support   task with RW support = 1    From Standing Position, Pick up Object from Floor Unable to try/needs assist to keep balance   task with RW support = 2    From Standing Position, Turn to Look Behind Over each Shoulder Needs assist to keep from losing balance and falling   task with RW  support = 1    Turn 360 Degrees Needs assistance while turning   task with RW support = 0    Standing Unsupported, Alternately Place Feet on Step/Stool Needs assistance to keep from falling or unable to try   task with  RW support = 0    Standing Unsupported, One Foot in [employer] balance while stepping or standing   task with RW support = 1    Standing on One Leg Unable to try or needs assist to prevent fall   task with RW support = 1    Total Score 9     Berg comment: tasks of Berg with RW support 27/56                     CURRENT PROSTHETIC WEAR ASSESSMENT: 08/03/2022: Pt wears both right TTA prosthesis & left AFO partial shoe filler>90% of awake hours.  He has 2 wounds on dorsum of left foot being monitored by PCP.  He is wearing left Vive Wear compression sock below knee on LLE which has helped wounds to heal. Pt is independent in donning & doffing both prosthesis & AFO. Skin check, cleaning and minor cues on adjusting ply socks.   05/09/2022:   Patient is dependent with: skin check, residual limb care, care of non-amputated limb, prosthetic cleaning, ply sock cleaning, and correct ply sock adjustment Donning prosthesis: Modified independence Doffing prosthesis: Modified independence Prosthetic wear tolerance: >90% of awake hours/day, 7 days/week Prosthetic weight bearing tolerance: 5 minutes Edema: LLE pitting edema with >10 sec capillary refill Residual limb condition: Right BKA limb has no openings, normal temperature & moisture.  Redness patella & patella tendon from friction of liner with knee flexed. Cylinderical shape.  LLE TMA pt's wife showed picture of wound on dorsum of foot that is being managed by PCP.   Prosthetic description: silicon liner with pin lock suspension, total contact socket, dynamic response foot.      TODAY'S TREATMENT:  09/28/2022 Prosthetic Training with right TTA prosthesis & Left AFO / partial foot prosthesis: PT verbal cues on assisting for safety  on stairs and recommendation for rest point on landing. Pt & wife verbalized understanding.   Pt amb 150' with RW with supervision. He reported he could have gone further energy / breathing wise but his legs felt wobbly. Sit to/from stand from rolling desk chair to simulate porch swing at beach with supervision & cues.  Neuromuscular Re-ed: Standing feet hip width apart on foam beam with single UE support on RW with single UE red theraband resistance working on upright posture: 10 reps ea UE for forward reach, row & biceps curl. Standing on foam beam with single UE support on RW hip width: reaching single UE knee to overhead & reaching behind with trunk rotation following hand with eyes 10 rep ea. Standing on foam beam with single UE support on RW using mirror for upright posture alternating LEs 5 reps ea - forward kicks, abduction, extension & adduction both ant/post to stance LE. Rocker board with BUE support - ant/level/post & right/level/left  initially with round single pivot board.  Pt continues to need short frequent rests between activities due to [facility]   09/26/2022 Therapeutic exercise: PT instructed with demo & verbal cues on hamstring stretch with foot on chair and heel on floor with strap DF 20 sec hold 2 reps ea Gastroc stretch on 4" step heel depression 30 sec BLEs 1 reps  Neuromuscular Re-ed: Picking up item from floor with RUE on mid-bar of RW with supervision. Standing feet hip width apart crossways on foam beam with single UE support //bar with single UE red theraband resistance working on upright posture: 10 reps ea UE for forward reach, row & biceps curl. Standing crossways  on foam beam with light BUE support static with eyes closed 10 sec 3 reps Standing crossways on foam beam with light UE support feet hip width: reaching single UE knee to overhead & reaching behind with trunk rotation following hand with eyes 10 rep ea. Standing crossways on foam beam with light BUE  support using mirror for upright posture alternating LEs 5 reps ea - forward kicks, abduction, extension & adduction both ant/post to stance LE. Rocker board with BUE support - ant/level/post & right/level/left  initially with round single pivot board.  Pt continues to need short frequent rests between activities due to [facility]  09/21/2022 Neuromuscular Re-ed: Standing feet hip width apart on floor with single UE support //bar with single UE red theraband resistance working on upright posture: 10 reps ea UE for forward reach, row & biceps curl. Standing feet hip width apart on foam with single UE support //bar with single UE red theraband resistance working on upright posture: 10 reps ea UE for forward reach, row & biceps curl. Standing on foam with light BUE support static with eyes closed 10 sec 3 reps Standing on foam with light UE support feet hip width: reaching single UE knee to overhead & reaching behind with trunk rotation following hand with eyes 10 rep ea. Standing on foam with light BUE support using mirror for upright posture alternating LEs 5 reps ea - forward kicks, abduction, extension & adduction both ant/post to stance LE. Rocker board with BUE support - ant/level/post & right/level/left  initially with round single pivot board. Step up & down leading with BLEs with BUE support //bars on 4" step 5 reps ea LE Backwards gait in //bars with PT cues on technique 8' X 2 Side stepping right & left in //bars with PT cues on technique 8' X 2  Pt continues to need short frequent rests between activities due to [place] Code: [email or web address] URL: [email or web address] Date: 07/05/2022 Prepared by: Jamey Reas  Exercises - Seated Correct Posture  - 1 x daily - 7 x weekly - Seated Scapular Retraction  - 2-5 x daily - 7 x weekly - 1 sets - 10 reps - 5 seconds hold - Supine Chin Tucks on Flat Ball  - 2-5 x daily - 7 x weekly - 1 sets - 10 reps - 5  seconds hold - Seated Isometric Cervical Retraction with Chin Tucks with Pillow behind head  - 2-5 x daily - 7 x weekly - 1 sets - 10 reps - 5 seconds hold - Circular Shoulder Pendulum with Table Support  - 1-2 x daily - 7 x weekly - 1-2 sets - 10 reps - 5 seconds hold - Flexion-Extension Shoulder Pendulum with Table Support  - 1-2 x daily - 7 x weekly - 1-2 sets - 10 reps - Horizontal Shoulder Pendulum with Table Support  - 1-2 x daily - 7 x weekly - 1-2 sets - 10 reps - Supine Shoulder Flexion Extension AAROM with Dowel  - 1 x daily - 7 x weekly - 1-2 sets - 10 reps - 5 seconds hold - Supine Shoulder Abduction AAROM with Dowel  - 1 x daily - 7 x weekly - 1-2 sets - 10 reps - 5 seconds hold - Supine Shoulder External Rotation with Dowel  - 1 x daily - 7 x weekly - 1-2 sets - 10 reps - 5 seconds hold   Neuromuscular Reed-  cues on proprioception RLE using residual limb, balance &  standing tolerance.   Do each exercise 1-2  times per day Do each exercise 5-10 repetitions Hold each exercise for 2 seconds to feel your location  AT [place].  Try to find this position when standing still for activities.   USE TAPE ON FLOOR TO MARK THE MIDLINE POSITION which is even with middle of sink.  You also should try to feel with your limb pressure in socket.  You are trying to feel with limb what you used to feel with the bottom of your foot.  Side to Side Shift: Moving your hips only (not shoulders): move weight onto your left leg, HOLD/FEEL pressure in socket.  Move back to equal weight on each leg, HOLD/FEEL pressure in socket. Move weight onto your right leg, HOLD/FEEL pressure in socket. Move back to equal weight on each leg, HOLD/FEEL pressure in socket. Repeat.  Start with both hands on sink, progress to hand on prosthetic side only, then no hands.  Front to Back Shift: Moving your hips only (not shoulders): move your weight forward  onto your toes, HOLD/FEEL pressure in socket. Move your weight back to equal Flat Foot on both legs, HOLD/FEEL  pressure in socket. Move your weight back onto your heels, HOLD/FEEL  pressure in socket. Move your weight back to equal on both legs, HOLD/FEEL  pressure in socket. Repeat.  Start with both hands on sink, progress to hand on prosthetic side only, then no hands.  Moving Cones / Cups: With equal weight on each leg: Hold on with one hand the first time, then progress to no hand supports. Move cups from one side of sink to the other. Place cups ~2" out of your reach, progress to 10" beyond reach.  Place one hand in middle of sink and reach with other hand. Do both arms.  Then hover one hand and move cups with other hand.  Overhead/Upward Reaching: alternated reaching up to top cabinets or ceiling if no cabinets present. Keep equal weight on each leg. Start with one hand support on counter while other hand reaches and progress to no hand support with reaching.  ace one hand in middle of sink and reach with other hand. Do both arms.  Then hover one hand and move cups with other hand.  5.   Looking Over Shoulders: With equal weight on each leg: alternate turning to look over your shoulders with one hand support on counter as needed.  Start with head motions only to look in front of shoulder, then even with shoulder and progress to looking behind you. To look to side, move head /eyes, then shoulder on side looking pulls back, shift more weight to side looking and pull hip back. Place one hand in middle of sink and let go with other hand so your shoulder can pull back. Switch hands to look other way.   Then hover one hand and look over shoulder. If looking right, use left hand at sink. If looking left, use right hand at sink. 6.  Alternate Tapping Toes or Stepping to lower shelf of cabinet  Move items under cabinet out of your way. Shift your hips/pelvis so weight on stance leg & Tighten muscles in hip.   SLOWLY step other leg so front of foot is in cabinet. Then step back to floor. Repeat with other leg.     ASSESSMENT:   CLINICAL IMPRESSION: PT progressed standing balance to less stable UE support  of RW compared to //bar.    Patient continues to be limited by [facility] but recovers quickly with short rest. Pt continues to benefit from skilled PT  OBJECTIVE IMPAIRMENTS .Abnormal gait, cardiopulmonary status limiting activity, decreased activity tolerance, decreased balance, decreased endurance, decreased knowledge of use of DME, decreased mobility, decreased ROM, decreased strength, dizziness, increased edema, impaired flexibility, postural dysfunction, prosthetic dependency , obesity, and pain.    ACTIVITY LIMITATIONS carrying, lifting, bending, standing, stairs, transfers, locomotion level, UE use with ADLs, sleep    PARTICIPATION LIMITATIONS: driving, community activity, and yard work, UE use with ADLs &  household activities   [place], Time since onset of injury/illness/exacerbation, and 3+ comorbidities: see PMH  are also affecting patient's functional outcome.    REHAB POTENTIAL: Good   CLINICAL DECISION MAKING: Evolving/moderate complexity   EVALUATION COMPLEXITY: Moderate     GOALS: Goals reviewed with patient? Yes  SHORT TERM GOALS: Target date: 09/29/2022  1. Patient able to pick up items from floor with single UE support on walker or surface. Baseline: SEE OBJECTIVE DATA Goal status: met 09/26/2022  2. Patient reports ambulating in home & entering/exiting home >90% of time over w/c use.  Baseline: SEE OBJECTIVE DATA Goal status: partially met 09/28/2022  3. Patient ambulates up to 200' with RW, prosthesis & AFO with supervision.  Goal status: partially met to level of supervision but not distance 09/28/2022     UPDATED LONG TERM GOALS: Target date: 10/26/2022   Patient demonstrates & verbalized understanding of prosthetic care to enable safe  utilization of prosthesis. Baseline: SEE OBJECTIVE DATA Goal status: ongoing 08/03/22    Patient tolerates right prosthesis & left AFO / partial foot prosthesis wear >90% of awake hours without skin or limb pain issues. Baseline: SEE OBJECTIVE DATA Goal status: ongoing 08/03/22    Berg Balance >/= 20/56 and tasks of Berg with RW support >/= 52/56 to indicate lower fall risk with standing ADLs Baseline: SEE OBJECTIVE DATA Goal status: 08/03/2022 updated    Patient ambulates >300' with prosthesis & LRAD modified independent Baseline: SEE OBJECTIVE DATA Goal status: ongoing 08/03/2022   Patient negotiates ramps, curbs & stairs with single rail with prosthesis & LRAD modified independent. Baseline: SEE OBJECTIVE DATA Goal status: ongoing 08/03/22  PLAN: PT FREQUENCY: 2x/week   PT DURATION: 12 weeks  PLANNED INTERVENTIONS: Therapeutic exercises, Therapeutic activity, Neuromuscular re-education, Balance training, Gait training, Patient/Family education, Self Care, Stair training, Vestibular training, Canalith repositioning, Prosthetic training, DME instructions, Re-evaluation, and physical performance testing    PLAN FOR NEXT SESSION:     continue to Progress standing balance working on compliant surfaces holding RW and prosthetic gait with rollator as tolerated with monitoring his respiratory response.   Jamey Reas, PT, DPT 09/28/2022, 4:44 PM

## 2022-10-03 ENCOUNTER — Ambulatory Visit (INDEPENDENT_AMBULATORY_CARE_PROVIDER_SITE_OTHER): Payer: Medicare Other | Admitting: Physical Therapy

## 2022-10-03 ENCOUNTER — Encounter: Payer: Self-pay | Admitting: Physical Therapy

## 2022-10-03 DIAGNOSIS — R293 Abnormal posture: Secondary | ICD-10-CM | POA: Diagnosis not present

## 2022-10-03 DIAGNOSIS — R2681 Unsteadiness on feet: Secondary | ICD-10-CM

## 2022-10-03 DIAGNOSIS — M6281 Muscle weakness (generalized): Secondary | ICD-10-CM | POA: Diagnosis not present

## 2022-10-03 DIAGNOSIS — Z9181 History of falling: Secondary | ICD-10-CM

## 2022-10-03 DIAGNOSIS — R2689 Other abnormalities of gait and mobility: Secondary | ICD-10-CM | POA: Diagnosis not present

## 2022-10-03 NOTE — Therapy (Signed)
OUTPATIENT PHYSICAL THERAPY TREATMENT NOTE   Patient Name: Shane [name] MRN: [id number] DOB:Jul 24, 1941, 81 y.o., male Today's Date: 10/03/2022  PCP: Burnard Bunting, MD REFERRING PROVIDER: [name], MD    END OF SESSION:   PT End of Session - 10/03/22 0758     Visit Number 40    Number of Visits 63    Date for PT Re-Evaluation 10/26/22    Authorization Type UHC Medicare    Authorization Time Period [facility] MEDICARE  $20 COPAY    Progress Note Due on Visit 16    PT Start Time 0759    PT Stop Time 0841    PT Time Calculation (min) 42 min    Equipment Utilized During Treatment Gait belt    Activity Tolerance Patient tolerated treatment well;Patient limited by fatigue    Behavior During Therapy WFL for tasks assessed/performed                      Past Medical History:  Diagnosis Date   Anxiety    AV block, Mobitz 1    Cataract    Chronic kidney disease    d/t DM   CKD (chronic kidney disease), stage III ([place])    Depression    Diabetes mellitus    Vgo disposal insulin bolus  simular to insulin pump   Dyspnea    GERD (gastroesophageal reflux disease)    History of kidney stones    passed   Hyperlipidemia    Hypertension    Idiopathic pulmonary fibrosis ([place]) 11/2016   ILD (interstitial lung disease) ([place])    Moderate aortic stenosis    a. 10/2019 Echo: EF 55-60%, Gr2 DD. Nl RV.    Neuromuscular disorder ([place])    Neuropathy associated with endocrine disorder ([place])    Nonobstructive CAD (coronary artery disease)    a. 2012 Cath: mod, nonobs dzs; b. 10/2016 MV: EF 60%, no ischemia.   OSA on CPAP 05/05/2017   Unattended Home Sleep Test 7/2/813-AHI 38.6/hour, desaturation to 64%, body weight 261 pounds   PONV (postoperative nausea and vomiting)    Sleep apnea     uses cpap asked to bring mask and tubing   Past Surgical History:  Procedure Laterality Date   ABDOMINAL AORTOGRAM W/LOWER EXTREMITY N/A 12/10/2020   Procedure: ABDOMINAL AORTOGRAM  W/LOWER EXTREMITY;  Surgeon: Cherre Robins, MD;  Location: [place] [facility];  Service: Cardiovascular;  Laterality: N/A;   AMPUTATION Right 01/22/2021   Procedure: 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;  Surgeon: [name], MD;  Location: [place];  Service: Orthopedics;  Laterality: Right;   AMPUTATION Right 03/17/2021   Procedure: RIGHT BELOW KNEE AMPUTATION;  Surgeon: [name], MD;  Location: [place];  Service: Orthopedics;  Laterality: Right;   ANKLE FUSION Right 01/22/2021   Procedure: RIGHT FOOT TIBIOCALCANEAL FUSION;  Surgeon: [name], MD;  Location: [place];  Service: Orthopedics;  Laterality: Right;   ANTERIOR FUSION CERVICAL SPINE  2012   CARDIAC CATHETERIZATION  2011   CARDIAC CATHETERIZATION N/A 11/09/2016   Procedure: Right Heart Cath;  Surgeon: Belva Crome, MD;  Location: [place] [facility];  Service: Cardiovascular;  Laterality: N/A;   carpel tunnel     left wrist   CATARACT EXTRACTION     CATARACT EXTRACTION W/ INTRAOCULAR LENS  IMPLANT, BILATERAL  2013   CERVICAL LAMINECTOMY  2012   COLONOSCOPY N/A 01/14/2013   Procedure: COLONOSCOPY;  Surgeon: Irene Shipper, MD;  Location:  [facility];  Service: Endoscopy;  Laterality: N/A;   CORONARY ARTERY BYPASS GRAFT  11/04/2020   LIMA-LAD, SVG-OM1, SVG-PDA (Dr Marney [facility]) dc 11/18/2020   EYE SURGERY     I & D EXTREMITY Right 02/19/2021   Procedure: RIGHT ANKLE DEBRIDEMENT AND PLACEMENT ANTIBIOTIC BEADS;  Surgeon: [name], MD;  Location: [place];  Service: Orthopedics;  Laterality: Right;   KNEE SURGERY  1998   left   LEFT HEART CATH AND CORONARY ANGIOGRAPHY N/A 07/10/2020   Procedure: LEFT HEART CATH AND CORONARY ANGIOGRAPHY;  Surgeon: Sherren Mocha, MD;  Location: [place] [facility];  Service: Cardiovascular;  Laterality: N/A;   LUMBAR LAMINECTOMY  2003   LUNG BIOPSY Left 12/26/2016   Procedure: LUNG BIOPSY;  Surgeon: [name], MD;  Location: [place];  Service: Thoracic;  Laterality: Left;   PACEMAKER IMPLANT  N/A 03/30/2020   Procedure: PACEMAKER IMPLANT;  Surgeon: Evans Lance, MD;  Location: [place] [facility];  Service: Cardiovascular;  Laterality: N/A;   PERIPHERAL VASCULAR INTERVENTION Right 12/10/2020   Procedure: PERIPHERAL VASCULAR INTERVENTION;  Surgeon: Cherre Robins, MD;  Location: [place] [facility];  Service: Cardiovascular;  Laterality: Right;  SFA   POSTERIOR FUSION CERVICAL SPINE  2012   PPM GENERATOR REMOVAL N/A 12/14/2020   Procedure: PPM GENERATOR REMOVAL;  Surgeon: Evans Lance, MD;  Location: [place] [facility];  Service: Cardiovascular;  Laterality: N/A;   TEE WITHOUT CARDIOVERSION N/A 12/11/2020   Procedure: TRANSESOPHAGEAL ECHOCARDIOGRAM (TEE);  Surgeon: Geralynn Rile, MD;  Location: [place];  Service: Cardiovascular;  Laterality: N/A;   TRIGGER FINGER RELEASE  2011   4th finger left hand   VIDEO ASSISTED THORACOSCOPY Left 12/26/2016   Procedure: VIDEO ASSISTED THORACOSCOPY;  Surgeon: [name], MD;  Location: [place];  Service: Thoracic;  Laterality: Left;   VIDEO BRONCHOSCOPY N/A 12/26/2016   Procedure: VIDEO BRONCHOSCOPY;  Surgeon: [name], MD;  Location: [place];  Service: Thoracic;  Laterality: N/A;   Patient Active Problem List   Diagnosis Date Noted   Intermediate stage nonexudative age-related macular degeneration of both eyes 04/25/2022   Research study patient 12/24/2021   Post-operative pain    CKD (chronic kidney disease), stage II    Labile blood glucose    Pulmonary fibrosis ([place])    Hyponatremia    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[place]) 03/26/2021   S/P BKA (below knee amputation) unilateral, right ([place])    Shortness of breath    Acute blood loss anemia    Stage 3 chronic kidney disease ([place])    Atrial fibrillation ([place])    Controlled type 2 diabetes mellitus with hyperglycemia, with long-term current use of insulin (HCC)    Postoperative pain    Postoperative hemorrhagic shock 03/20/2021   Atherosclerosis of native  arteries of extremities with gangrene, right leg ([place])    Hardware complicating wound infection ([place])    Abscess of bursa of right ankle 02/15/2021   Endocarditis 01/19/2021   Peripheral vascular disease ([place]) 12/29/2020   CHF (congestive heart failure), NYHA class II, chronic, diastolic ([place]) 75/17/0017   History of COVID-19 12/22/2020   SSS (sick sinus syndrome) ([place]) 12/22/2020   Acute bacterial endocarditis    MSSA bacteremia 12/09/2020   Diabetic foot ulcer ([place]) 12/09/2020   Foot drop, right foot    Generalized weakness 12/08/2020   Dehydration with hyponatremia 12/08/2020   Atrial fibrillation, chronic ([place]) 12/08/2020   Elevated troponin level not due myocardial infarction  12/08/2020   Adrenal insufficiency (HCC) 11/14/2020   Orthostatic hypotension 11/14/2020   Physical deconditioning 11/14/2020   Difficulty sleeping 11/07/2020   Postoperative anemia due to acute blood loss 11/07/2020   Right ventricular dysfunction 11/05/2020   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/04/2020   Hearing loss 11/03/2020   Cardiac device in situ 09/09/2020   Coronary atherosclerosis due to lipid rich plaque 09/02/2020   COVID-19 virus infection 07/18/2020   Coronary artery disease involving native heart without angina pectoris 07/10/2020   Chronic kidney disease, stage 3a ([place]) 03/29/2020   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/28/2020   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 ([place]) 03/16/2020   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 ([place]) 03/16/2020   Right epiretinal membrane 03/16/2020   Posterior vitreous detachment of right eye 03/16/2020   Aortic stenosis, moderate 03/12/2020   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   Near syncope 04/27/2018   Hypoglycemia due to insulin 01/06/2018   Essential hypertension 01/06/2018   Diabetes mellitus type 2, with complication, on long term insulin pump ([place])  01/06/2018   Syncope and collapse 01/05/2018   Encounter for therapeutic drug monitoring 06/29/2017   OSA on CPAP 05/05/2017   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[place]) 01/05/2017   Abnormal chest x-ray 10/12/2016   Benign neoplasm of colon 01/14/2013    REFERRING DIAG: Z89.511 (ICD-10-CM) - Hx of right BKA   ONSET DATE: 05/04/2022 MD visit with release to WB on LLE  THERAPY DIAG:  Unsteadiness on feet  Other abnormalities of gait and mobility  Muscle weakness (generalized)  Abnormal posture  History of falling  Rationale for Evaluation and Treatment Rehabilitation  PERTINENT HISTORY: CKD st III, AV block Mobitz, pacemaker, pulmonary fibrosis, CAD, CABG 21, HTN, A-Fib, DM2, PAD, right BKA, Left TMA  PRECAUTIONS: ICD/Pacemaker - continue monitoring vitals with activity and calculated HRmax is 151 bpm  SUBJECTIVE:  He did not take fluid pill yesterday so limb is fuller.  He has a rash on limb.    PAIN:    Are you having pain? Yes: NPRS scale: today  0/10, over last week lowest 0/10  Pain location: left shoulder lateral just distal to joint Pain description: tingling when shoulder in improper posture. Aggravating factors: improper posture. Relieving factors: correcting posture   OBJECTIVE: (objective measures completed at initial evaluation unless otherwise dated)  COGNITION: Overall cognitive status: Within functional limits for tasks assessed   POSTURE: 05/09/2022:  rounded shoulders, forward head, and flexed trunk    LOWER EXTREMITY ROM:    UPPER EXTREMITY ROM:  ROM P:passive  A:active Left 06/23/22 Supine all limited by pain Left 08/01/22 Seated All painfree Right 08/01/22 Seated  Shoulder flexion 30* A: 148 A: 153  Shoulder extension 0* neutral A: 72 A: 74  Shoulder abduction 45* A: 135 A: 141  Shoulder adduction     Shoulder internal rotation 20* A: 84 A: 89  Shoulder external rotation 10* A: 88 A: 83  Elbow flexion     Elbow extension -15*     (Blank  rows = not tested)    ROM P:passive  A:active Right Eval 05/09/2022 Left Eval 05/09/2022 Bilateral 08/03/22  Hip flexion       Hip extension A: -25* standing A: -25* standing A: -15*  RW support standing  Hip abduction       Hip adduction       Hip internal rotation  Hip external rotation       Knee flexion       Knee extension       Ankle dorsiflexion NA P: -17*   Ankle plantarflexion NA     Ankle inversion NA     Ankle eversion NA      (Blank rows = not tested)   LOWER EXTREMITY MMT:   MMT Right Eval 05/09/2022 Left Eval 05/09/2022  Hip flexion      Hip extension Functional testing 3-/5 Functional testing 3-/5  Hip abduction Functional testing 3-/5 Functional testing 3-/5  Hip adduction      Hip internal rotation      Hip external rotation      Knee flexion Functional testing 3-/5 Functional testing 3-/5  Knee extension Functional testing 3/5 Functional testing 3/5  Ankle dorsiflexion      Ankle plantarflexion      Ankle inversion      Ankle eversion      (Blank rows = not tested)   POSTURE:  06/23/2022  seated - head forward, rounded shoulders and posterior pelvic tilt  TRANSFERS: 05/09/2022:  Sit to stand: SBA and requires use of armrests from 20" stable w/c & needs RW for stabilization 05/09/2022:  Stand to sit: SBA and requires use of armrests from 20" stable w/c & needs RW for stabilization   GAIT: 08/11/2022: Pt amb 100' with rollator, right TTA prosthesis & left AFO partial shoe filler with SBA. He demonstrates increased step length bil., intermittent impaired foot clearance on RLE  08/03/2022: 2-minute Walk Test with RW, right TTA prosthesis & left AFO partial shoe filler. Resting pre HR 70 SpO2 100% HR after activity HR 52  SpO2 100% light amount of light headness Distance 158' with supervision.   Gait pattern: step-to pattern, but distance improved since evaluation, Right hip hike, trunk flexed, and normal 4" BOS  Pt neg ramp & curb with RW, ight TTA  prosthesis & left AFO partial shoe filler with supervision. He required seated rest half way due to fatigue.    05/09/2022:  Gait pattern: step to pattern, decreased step length- Left, decreased stance time- Right, Right hip hike, antalgic, lateral hip instability, trunk flexed, and wide BOS Distance walked: 25' Assistive device utilized: Environmental consultant - 2 wheeled and TTA prosthesis RLE Level of assistance: Min A Comments: pt reports dizziness with turning in gait   UE FUNCTION: 06/23/2022:  Pt reports that he uses RUE for fine motor like writing but uses both hands for gross motor or sports.  Seated - pt unable to raise LUE in flexion or abd due to pain.  Supine - pt unable to move LUE in flexion or abd due to pain.  PROM flexion > pain than abd   Pt tolerates elbow ext with palm supinated but with palm neutral causes shoulder pain. Also painful with elbow already extended and pronate palm.   PT simulated pushing on RW thru LUE with manual resistance.  Pt reports slight increase in pain but not as sharp as above function.    FUNCTIONAL TESTs:  Merrilee Jansky Balance Scale: 08/03/2022:  13/56 & tasks of Berg with RW support 45/56  [facility] PT Assessment - 08/03/22 0800                Berg Balance Test    Sit to Stand Needs minimal aid to stand or to stabilize   task with RW = 3    Standing Unsupported Able to stand 30 seconds unsupported  task with RW = 4    Sitting with Back Unsupported but Feet Supported on Floor or Stool Able to sit safely and securely 2 minutes     Stand to Sit Controls descent by using hands   task with RW = 3    Transfers Able to transfer safely, definite need of hands     Standing Unsupported with Eyes Closed Unable to keep eyes closed 3 seconds but stays steady   task with RW = 4    Standing Unsupported with Feet Together Needs help to attain position and unable to hold for 15 seconds   task with RW = 3    From Standing, Reach Forward with Outstretched Arm Reaches forward but needs  supervision   task with RW = 4    From Standing Position, Pick up Object from Floor Unable to try/needs assist to keep balance   task with RW = 3    From Standing Position, Turn to Look Behind Over each Shoulder Needs assist to keep from losing balance and falling   task with RW = 3    Turn 360 Degrees Needs assistance while turning   task with RW = 2    Standing Unsupported, Alternately Place Feet on Step/Stool Needs assistance to keep from falling or unable to try   task with RW = 4    Standing Unsupported, One Foot in [employer] balance while stepping or standing   task with RW = 3    Standing on One Leg Unable to try or needs assist to prevent fall   task with RW = 4    Total Score 15     Berg comment: tasks of Berg with RW = 45/56        Berg Balance Scale: 05/09/2022:  9/56 & tasks of Berg with RW support 27/56    [facility] PT Assessment - 05/09/22 0930                Berg Balance Test    Sit to Stand Needs moderate or maximal assist to stand   task with RW support = 3    Standing Unsupported Unable to stand 30 seconds unassisted   task with RW support = 3    Sitting with Back Unsupported but Feet Supported on Floor or Stool Able to sit safely and securely 2 minutes     Stand to Sit Uses backs of legs against chair to control descent   task with RW support = 3    Transfers Able to transfer safely, definite need of hands     Standing Unsupported with Eyes Closed Needs help to keep from falling   task with RW support = 3    Standing Unsupported with Feet Together Needs help to attain position and unable to hold for 15 seconds   task with RW support = 2    From Standing, Reach Forward with Outstretched Arm Loses balance while trying/requires external support   task with RW support = 1    From Standing Position, Pick up Object from Floor Unable to try/needs assist to keep balance   task with RW support = 2    From Standing Position, Turn to Look Behind Over each Shoulder Needs assist to keep  from losing balance and falling   task with RW support = 1    Turn 360 Degrees Needs assistance while turning   task with RW support = 0    Standing Unsupported, Alternately Place Feet  on Step/Stool Needs assistance to keep from falling or unable to try   task with RW support = 0    Standing Unsupported, One Foot in [employer] balance while stepping or standing   task with RW support = 1    Standing on One Leg Unable to try or needs assist to prevent fall   task with RW support = 1    Total Score 9     Berg comment: tasks of Berg with RW support 27/56                     CURRENT PROSTHETIC WEAR ASSESSMENT: 08/03/2022: Pt wears both right TTA prosthesis & left AFO partial shoe filler>90% of awake hours.  He has 2 wounds on dorsum of left foot being monitored by PCP.  He is wearing left Vive Wear compression sock below knee on LLE which has helped wounds to heal. Pt is independent in donning & doffing both prosthesis & AFO. Skin check, cleaning and minor cues on adjusting ply socks.   05/09/2022:   Patient is dependent with: skin check, residual limb care, care of non-amputated limb, prosthetic cleaning, ply sock cleaning, and correct ply sock adjustment Donning prosthesis: Modified independence Doffing prosthesis: Modified independence Prosthetic wear tolerance: >90% of awake hours/day, 7 days/week Prosthetic weight bearing tolerance: 5 minutes Edema: LLE pitting edema with >10 sec capillary refill Residual limb condition: Right BKA limb has no openings, normal temperature & moisture.  Redness patella & patella tendon from friction of liner with knee flexed. Cylinderical shape.  LLE TMA pt's wife showed picture of wound on dorsum of foot that is being managed by PCP.   Prosthetic description: silicon liner with pin lock suspension, total contact socket, dynamic response foot.      TODAY'S TREATMENT:  10/03/2022 Prosthetic Training with right TTA prosthesis & Left AFO / partial foot  prosthesis: PT adjusted velcro attachment points on AFO which improved ability to align front door. PT reviewed proper donning of prosthesis esp rotation alignment & pin alignment. He has blood blisters on medial proximal limb and lateral compartment.  PT recommended using antiperspirant & wiping limb with damp cloth with doffing as probably scratching with itchness when sweaty limb hits air. Pt & wife verbalized understanding.   Neuromuscular Re-ed: Standing feet hip width apart on foam with single UE support on RW with single UE red theraband resistance working on upright posture: 10 reps ea UE for forward reach, row & biceps curl. Standing on foam with single UE support on RW hip width: reaching single UE knee to overhead & reaching behind with trunk rotation following hand with eyes 10 rep ea. Standing on foam with single UE support on RW using mirror for upright posture alternating LEs 5 reps ea - forward kicks, abduction, extension & adduction both ant/post to stance LE. Rocker board with BUE support - ant/level/post & right/level/left  initially with round single pivot board.  Pt continues to need short frequent rests between activities due to [facility]  09/28/2022 Prosthetic Training with right TTA prosthesis & Left AFO / partial foot prosthesis: PT verbal cues on assisting for safety on stairs and recommendation for rest point on landing. Pt & wife verbalized understanding.   Pt amb 150' with RW with supervision. He reported he could have gone further energy / breathing wise but his legs felt wobbly. Sit to/from stand from rolling desk chair to simulate porch swing at beach with supervision & cues.  Neuromuscular Re-ed:  Standing feet hip width apart on foam with single UE support on RW with single UE red theraband resistance working on upright posture: 10 reps ea UE for forward reach, row & biceps curl. Standing on foam with single UE support on RW hip width: reaching single UE knee to  overhead & reaching behind with trunk rotation following hand with eyes 10 rep ea. Standing on foam with single UE support on RW using mirror for upright posture alternating LEs 5 reps ea - forward kicks, abduction, extension & adduction both ant/post to stance LE. Rocker board with BUE support - ant/level/post & right/level/left  initially with round single pivot board.  Pt continues to need short frequent rests between activities due to [facility]   09/26/2022 Therapeutic exercise: PT instructed with demo & verbal cues on hamstring stretch with foot on chair and heel on floor with strap DF 20 sec hold 2 reps ea Gastroc stretch on 4" step heel depression 30 sec BLEs 1 reps  Neuromuscular Re-ed: Picking up item from floor with RUE on mid-bar of RW with supervision. Standing feet hip width apart crossways on foam beam with single UE support //bar with single UE red theraband resistance working on upright posture: 10 reps ea UE for forward reach, row & biceps curl. Standing crossways on foam beam with light BUE support static with eyes closed 10 sec 3 reps Standing crossways on foam beam with light UE support feet hip width: reaching single UE knee to overhead & reaching behind with trunk rotation following hand with eyes 10 rep ea. Standing crossways on foam beam with light BUE support using mirror for upright posture alternating LEs 5 reps ea - forward kicks, abduction, extension & adduction both ant/post to stance LE. Rocker board with BUE support - ant/level/post & right/level/left  initially with round single pivot board.  Pt continues to need short frequent rests between activities due to Ch[place]: [email or web address] URL: [email or web address] Date: 07/05/2022 Prepared by: Jamey Reas  Exercises - Seated Correct Posture  - 1 x daily - 7 x weekly - Seated Scapular Retraction  - 2-5 x daily - 7 x weekly - 1 sets - 10 reps - 5 seconds hold - Supine  Chin Tucks on Flat Ball  - 2-5 x daily - 7 x weekly - 1 sets - 10 reps - 5 seconds hold - Seated Isometric Cervical Retraction with Chin Tucks with Pillow behind head  - 2-5 x daily - 7 x weekly - 1 sets - 10 reps - 5 seconds hold - Circular Shoulder Pendulum with Table Support  - 1-2 x daily - 7 x weekly - 1-2 sets - 10 reps - 5 seconds hold - Flexion-Extension Shoulder Pendulum with Table Support  - 1-2 x daily - 7 x weekly - 1-2 sets - 10 reps - Horizontal Shoulder Pendulum with Table Support  - 1-2 x daily - 7 x weekly - 1-2 sets - 10 reps - Supine Shoulder Flexion Extension AAROM with Dowel  - 1 x daily - 7 x weekly - 1-2 sets - 10 reps - 5 seconds hold - Supine Shoulder Abduction AAROM with Dowel  - 1 x daily - 7 x weekly - 1-2 sets - 10 reps - 5 seconds hold - Supine Shoulder External Rotation with Dowel  - 1 x daily - 7 x weekly - 1-2 sets - 10 reps - 5 seconds hold   Neuromuscular Reed-  cues on proprioception RLE using  residual limb, balance & standing tolerance.   Do each exercise 1-2  times per day Do each exercise 5-10 repetitions Hold each exercise for 2 seconds to feel your location  AT [place].  Try to find this position when standing still for activities.   USE TAPE ON FLOOR TO MARK THE MIDLINE POSITION which is even with middle of sink.  You also should try to feel with your limb pressure in socket.  You are trying to feel with limb what you used to feel with the bottom of your foot.  Side to Side Shift: Moving your hips only (not shoulders): move weight onto your left leg, HOLD/FEEL pressure in socket.  Move back to equal weight on each leg, HOLD/FEEL pressure in socket. Move weight onto your right leg, HOLD/FEEL pressure in socket. Move back to equal weight on each leg, HOLD/FEEL pressure in socket. Repeat.  Start with both hands on sink, progress to hand on prosthetic side only, then no hands.  Front to  Back Shift: Moving your hips only (not shoulders): move your weight forward onto your toes, HOLD/FEEL pressure in socket. Move your weight back to equal Flat Foot on both legs, HOLD/FEEL  pressure in socket. Move your weight back onto your heels, HOLD/FEEL  pressure in socket. Move your weight back to equal on both legs, HOLD/FEEL  pressure in socket. Repeat.  Start with both hands on sink, progress to hand on prosthetic side only, then no hands.  Moving Cones / Cups: With equal weight on each leg: Hold on with one hand the first time, then progress to no hand supports. Move cups from one side of sink to the other. Place cups ~2" out of your reach, progress to 10" beyond reach.  Place one hand in middle of sink and reach with other hand. Do both arms.  Then hover one hand and move cups with other hand.  Overhead/Upward Reaching: alternated reaching up to top cabinets or ceiling if no cabinets present. Keep equal weight on each leg. Start with one hand support on counter while other hand reaches and progress to no hand support with reaching.  ace one hand in middle of sink and reach with other hand. Do both arms.  Then hover one hand and move cups with other hand.  5.   Looking Over Shoulders: With equal weight on each leg: alternate turning to look over your shoulders with one hand support on counter as needed.  Start with head motions only to look in front of shoulder, then even with shoulder and progress to looking behind you. To look to side, move head /eyes, then shoulder on side looking pulls back, shift more weight to side looking and pull hip back. Place one hand in middle of sink and let go with other hand so your shoulder can pull back. Switch hands to look other way.   Then hover one hand and look over shoulder. If looking right, use left hand at sink. If looking left, use right hand at sink. 6.  Alternate Tapping Toes or Stepping to lower shelf of cabinet  Move items under cabinet out of your way.  Shift your hips/pelvis so weight on stance leg & Tighten muscles in hip.  SLOWLY step other leg so front of foot is in cabinet. Then step back to floor. Repeat with other leg.     ASSESSMENT:   CLINICAL IMPRESSION: Patient's rash on residual limb  appears from scratching. Pt and wife appear to understand PT recommendations to decrease tendency to scratch with doffing.   Pt continues to benefit from skilled PT  OBJECTIVE IMPAIRMENTS .Abnormal gait, cardiopulmonary status limiting activity, decreased activity tolerance, decreased balance, decreased endurance, decreased knowledge of use of DME, decreased mobility, decreased ROM, decreased strength, dizziness, increased edema, impaired flexibility, postural dysfunction, prosthetic dependency , obesity, and pain.    ACTIVITY LIMITATIONS carrying, lifting, bending, standing, stairs, transfers, locomotion level, UE use with ADLs, sleep    PARTICIPATION LIMITATIONS: driving, community activity, and yard work, UE use with ADLs &  household activities   [place], Time since onset of injury/illness/exacerbation, and 3+ comorbidities: see PMH  are also affecting patient's functional outcome.    REHAB POTENTIAL: Good   CLINICAL DECISION MAKING: Evolving/moderate complexity   EVALUATION COMPLEXITY: Moderate     GOALS: Goals reviewed with patient? Yes  SHORT TERM GOALS: Target date: 09/29/2022  1. Patient able to pick up items from floor with single UE support on walker or surface. Baseline: SEE OBJECTIVE DATA Goal status: met 09/26/2022  2. Patient reports ambulating in home & entering/exiting home >90% of time over w/c use.  Baseline: SEE OBJECTIVE DATA Goal status: partially met 09/28/2022  3. Patient ambulates up to 200' with RW, prosthesis & AFO with supervision.  Goal status: partially met to level of supervision but not distance 09/28/2022     UPDATED LONG TERM GOALS: Target date: 10/26/2022   Patient demonstrates &  verbalized understanding of prosthetic care to enable safe utilization of prosthesis. Baseline: SEE OBJECTIVE DATA Goal status: ongoing 08/03/22    Patient tolerates right prosthesis & left AFO / partial foot prosthesis wear >90% of awake hours without skin or limb pain issues. Baseline: SEE OBJECTIVE DATA Goal status: ongoing 08/03/22    Berg Balance >/= 20/56 and tasks of Berg with RW support >/= 52/56 to indicate lower fall risk with standing ADLs Baseline: SEE OBJECTIVE DATA Goal status: 08/03/2022 updated    Patient ambulates >300' with prosthesis & LRAD modified independent Baseline: SEE OBJECTIVE DATA Goal status: ongoing 08/03/2022   Patient negotiates ramps, curbs & stairs with single rail with prosthesis & LRAD modified independent. Baseline: SEE OBJECTIVE DATA Goal status: ongoing 08/03/22  PLAN: PT FREQUENCY: 2x/week   PT DURATION: 12 weeks  PLANNED INTERVENTIONS: Therapeutic exercises, Therapeutic activity, Neuromuscular re-education, Balance training, Gait training, Patient/Family education, Self Care, Stair training, Vestibular training, Canalith repositioning, Prosthetic training, DME instructions, Re-evaluation, and physical performance testing    PLAN FOR NEXT SESSION:     check if PT recommendations for doffing & antiperspirant helped. Progress standing balance working on compliant surfaces holding RW and prosthetic gait with rollator as tolerated with monitoring his respiratory response.   Jamey Reas, PT, DPT 10/03/2022, 9:20 AM

## 2022-10-05 ENCOUNTER — Encounter: Payer: Self-pay | Admitting: Physical Therapy

## 2022-10-05 ENCOUNTER — Ambulatory Visit (INDEPENDENT_AMBULATORY_CARE_PROVIDER_SITE_OTHER): Payer: Medicare Other | Admitting: Physical Therapy

## 2022-10-05 DIAGNOSIS — R293 Abnormal posture: Secondary | ICD-10-CM

## 2022-10-05 DIAGNOSIS — R2689 Other abnormalities of gait and mobility: Secondary | ICD-10-CM

## 2022-10-05 DIAGNOSIS — M6281 Muscle weakness (generalized): Secondary | ICD-10-CM | POA: Diagnosis not present

## 2022-10-05 DIAGNOSIS — R2681 Unsteadiness on feet: Secondary | ICD-10-CM

## 2022-10-05 DIAGNOSIS — Z9181 History of falling: Secondary | ICD-10-CM

## 2022-10-05 NOTE — Therapy (Signed)
OUTPATIENT PHYSICAL THERAPY TREATMENT NOTE   Patient Name: Shane [name] MRN: [id number] DOB:[birth year], 81 y.o., male Today's Date: 10/05/2022  PCP: Burnard Bunting, MD REFERRING PROVIDER: [name], MD    END OF SESSION:   PT End of Session - 10/05/22 0803     Visit Number 41    Number of Visits 30    Date for PT Re-Evaluation 10/26/22    Authorization Type UHC Medicare    Authorization Time Period UHC MEDICARE  $20 COPAY    Progress Note Due on Visit 30    PT Start Time 0800    PT Stop Time 0842    PT Time Calculation (min) 42 min    Equipment Utilized During Treatment Gait belt    Activity Tolerance Patient tolerated treatment well;Patient limited by fatigue    Behavior During Therapy WFL for tasks assessed/performed                       Past Medical History:  Diagnosis Date   Anxiety    AV block, Mobitz 1    Cataract    Chronic kidney disease    d/t DM   CKD (chronic kidney disease), stage III ([place])    Depression    Diabetes mellitus    Vgo disposal insulin bolus  simular to insulin pump   Dyspnea    GERD (gastroesophageal reflux disease)    History of kidney stones    passed   Hyperlipidemia    Hypertension    Idiopathic pulmonary fibrosis ([place]) 11/2016   ILD (interstitial lung disease) ([place])    Moderate aortic stenosis    a. 10/2019 Echo: EF 55-60%, Gr2 DD. Nl RV.    Neuromuscular disorder ([place])    Neuropathy associated with endocrine disorder ([place])    Nonobstructive CAD (coronary artery disease)    a. 2012 Cath: mod, nonobs dzs; b. 10/2016 MV: EF 60%, no ischemia.   OSA on CPAP 05/05/2017   Unattended Home Sleep Test 7/2/813-AHI 38.6/hour, desaturation to 64%, body weight 261 pounds   PONV (postoperative nausea and vomiting)    Sleep apnea     uses cpap asked to bring mask and tubing   Past Surgical History:  Procedure Laterality Date   ABDOMINAL AORTOGRAM W/LOWER EXTREMITY N/A 12/10/2020   Procedure: ABDOMINAL AORTOGRAM  W/LOWER EXTREMITY;  Surgeon: Cherre Robins, MD;  Location: [facility];  Service: Cardiovascular;  Laterality: N/A;   AMPUTATION Right 01/22/2021   Procedure: 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;  Surgeon: [name], MD;  Location: [place];  Service: Orthopedics;  Laterality: Right;   AMPUTATION Right 03/17/2021   Procedure: RIGHT BELOW KNEE AMPUTATION;  Surgeon: [name], MD;  Location: [place];  Service: Orthopedics;  Laterality: Right;   ANKLE FUSION Right 01/22/2021   Procedure: RIGHT FOOT TIBIOCALCANEAL FUSION;  Surgeon: [name], MD;  Location: [place];  Service: Orthopedics;  Laterality: Right;   ANTERIOR FUSION CERVICAL SPINE  2012   CARDIAC CATHETERIZATION  2011   CARDIAC CATHETERIZATION N/A 11/09/2016   Procedure: Right Heart Cath;  Surgeon: Belva Crome, MD;  Location: [facility];  Service: Cardiovascular;  Laterality: N/A;   carpel tunnel     left wrist   CATARACT EXTRACTION     CATARACT EXTRACTION W/ INTRAOCULAR LENS  IMPLANT, BILATERAL  2013   CERVICAL LAMINECTOMY  2012   COLONOSCOPY N/A 01/14/2013   Procedure: COLONOSCOPY;  Surgeon: Irene Shipper, MD;  Location: [facility];  Service: Endoscopy;  Laterality: N/A;   CORONARY ARTERY BYPASS GRAFT  11/04/2020   LIMA-LAD, SVG-OM1, SVG-PDA (Dr Marney [facility]) dc 11/18/2020   EYE SURGERY     I & D EXTREMITY Right 02/19/2021   Procedure: RIGHT ANKLE DEBRIDEMENT AND PLACEMENT ANTIBIOTIC BEADS;  Surgeon: [name], MD;  Location: [place];  Service: Orthopedics;  Laterality: Right;   KNEE SURGERY  1998   left   LEFT HEART CATH AND CORONARY ANGIOGRAPHY N/A 07/10/2020   Procedure: LEFT HEART CATH AND CORONARY ANGIOGRAPHY;  Surgeon: Sherren Mocha, MD;  Location: [facility];  Service: Cardiovascular;  Laterality: N/A;   LUMBAR LAMINECTOMY  2003   LUNG BIOPSY Left 12/26/2016   Procedure: LUNG BIOPSY;  Surgeon: [name], MD;  Location: [place];  Service: Thoracic;  Laterality: Left;   PACEMAKER IMPLANT  N/A 03/30/2020   Procedure: PACEMAKER IMPLANT;  Surgeon: Evans Lance, MD;  Location: [facility];  Service: Cardiovascular;  Laterality: N/A;   PERIPHERAL VASCULAR INTERVENTION Right 12/10/2020   Procedure: PERIPHERAL VASCULAR INTERVENTION;  Surgeon: Cherre Robins, MD;  Location: [facility];  Service: Cardiovascular;  Laterality: Right;  SFA   POSTERIOR FUSION CERVICAL SPINE  2012   PPM GENERATOR REMOVAL N/A 12/14/2020   Procedure: PPM GENERATOR REMOVAL;  Surgeon: Evans Lance, MD;  Location: [place] [facility];  Service: Cardiovascular;  Laterality: N/A;   TEE WITHOUT CARDIOVERSION N/A 12/11/2020   Procedure: TRANSESOPHAGEAL ECHOCARDIOGRAM (TEE);  Surgeon: Geralynn Rile, MD;  Location: [place];  Service: Cardiovascular;  Laterality: N/A;   TRIGGER FINGER RELEASE  2011   4th finger left hand   VIDEO ASSISTED THORACOSCOPY Left 12/26/2016   Procedure: VIDEO ASSISTED THORACOSCOPY;  Surgeon: [name], MD;  Location: [place];  Service: Thoracic;  Laterality: Left;   VIDEO BRONCHOSCOPY N/A 12/26/2016   Procedure: VIDEO BRONCHOSCOPY;  Surgeon: [name], MD;  Location: [place];  Service: Thoracic;  Laterality: N/A;   Patient Active Problem List   Diagnosis Date Noted   Intermediate stage nonexudative age-related macular degeneration of both eyes 04/25/2022   Research study patient 12/24/2021   Post-operative pain    CKD (chronic kidney disease), stage II    Labile blood glucose    Pulmonary fibrosis ([place])    Hyponatremia    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[place]) 03/26/2021   S/P BKA (below knee amputation) unilateral, right ([place])    Shortness of breath    Acute blood loss anemia    Stage 3 chronic kidney disease ([place])    Atrial fibrillation ([place])    Controlled type 2 diabetes mellitus with hyperglycemia, with long-term current use of insulin ([facility])    Postoperative pain    Postoperative hemorrhagic shock 03/20/2021   Atherosclerosis of native  arteries of extremities with gangrene, right leg ([place])    Hardware complicating wound infection ([place])    Abscess of bursa of right ankle 02/15/2021   Endocarditis 01/19/2021   Peripheral vascular disease ([place]) 12/29/2020   CHF (congestive heart failure), NYHA class II, chronic, diastolic ([place]) 72/53/6644   History of COVID-19 12/22/2020   SSS (sick sinus syndrome) ([place]) 12/22/2020   Acute bacterial endocarditis    MSSA bacteremia 12/09/2020   Diabetic foot ulcer ([place]) 12/09/2020   Foot drop, right foot    Generalized weakness 12/08/2020   Dehydration with hyponatremia 12/08/2020   Atrial fibrillation, chronic ([place]) 12/08/2020   Elevated troponin level not due myocardial  infarction 12/08/2020   Adrenal insufficiency ([place]) 11/14/2020   Orthostatic hypotension 11/14/2020   Physical deconditioning 11/14/2020   Difficulty sleeping 11/07/2020   Postoperative anemia due to acute blood loss 11/07/2020   Right ventricular dysfunction 11/05/2020   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/04/2020   Hearing loss 11/03/2020   Cardiac device in situ 09/09/2020   Coronary atherosclerosis due to lipid rich plaque 09/02/2020   COVID-19 virus infection 07/18/2020   Coronary artery disease involving native heart without angina pectoris 07/10/2020   Chronic kidney disease, stage 3a ([place]) 03/29/2020   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/28/2020   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 ([place]) 03/16/2020   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 ([place]) 03/16/2020   Right epiretinal membrane 03/16/2020   Posterior vitreous detachment of right eye 03/16/2020   Aortic stenosis, moderate 03/12/2020   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   Near syncope 04/27/2018   Hypoglycemia due to insulin 01/06/2018   Essential hypertension 01/06/2018   Diabetes mellitus type 2, with complication, on long term insulin pump ([place])  01/06/2018   Syncope and collapse 01/05/2018   Encounter for therapeutic drug monitoring 06/29/2017   OSA on CPAP 05/05/2017   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[place]) 01/05/2017   Abnormal chest x-ray 10/12/2016   Benign neoplasm of colon 01/14/2013    REFERRING DIAG: Z89.511 (ICD-10-CM) - Hx of right BKA   ONSET DATE: 05/04/2022 MD visit with release to [facility] on LLE  THERAPY DIAG:  Unsteadiness on feet  Other abnormalities of gait and mobility  Muscle weakness (generalized)  Abnormal posture  History of falling  Rationale for Evaluation and Treatment Rehabilitation  PERTINENT HISTORY: CKD st III, AV block Mobitz, pacemaker, pulmonary fibrosis, CAD, CABG 21, HTN, A-Fib, DM2, PAD, right BKA, Left TMA  PRECAUTIONS: ICD/Pacemaker - continue monitoring vitals with activity and calculated HRmax is 151 bpm  SUBJECTIVE:  He did try PT recommendations and his limb is better.   PAIN:    Are you having pain? Yes: NPRS scale: today  0/10, over last week lowest 0/10  Pain location: left shoulder lateral just distal to joint Pain description: tingling when shoulder in improper posture. Aggravating factors: improper posture. Relieving factors: correcting posture   OBJECTIVE: (objective measures completed at initial evaluation unless otherwise dated)  COGNITION: Overall cognitive status: Within functional limits for tasks assessed   POSTURE: 05/09/2022:  rounded shoulders, forward head, and flexed trunk    LOWER EXTREMITY ROM:    UPPER EXTREMITY ROM:  ROM P:passive  A:active Left 06/23/22 Supine all limited by pain Left 08/01/22 Seated All painfree Right 08/01/22 Seated  Shoulder flexion 30* A: 148 A: 153  Shoulder extension 0* neutral A: 72 A: 74  Shoulder abduction 45* A: 135 A: 141  Shoulder adduction     Shoulder internal rotation 20* A: 84 A: 89  Shoulder external rotation 10* A: 88 A: 83  Elbow flexion     Elbow extension -15*     (Blank rows = not tested)     ROM P:passive  A:active Right Eval 05/09/2022 Left Eval 05/09/2022 Bilateral 08/03/22  Hip flexion       Hip extension A: -25* standing A: -25* standing A: -15*  RW support standing  Hip abduction       Hip adduction       Hip internal rotation       Hip external rotation  Knee flexion       Knee extension       Ankle dorsiflexion NA P: -17*   Ankle plantarflexion NA     Ankle inversion NA     Ankle eversion NA      (Blank rows = not tested)   LOWER EXTREMITY MMT:   MMT Right Eval 05/09/2022 Left Eval 05/09/2022  Hip flexion      Hip extension Functional testing 3-/5 Functional testing 3-/5  Hip abduction Functional testing 3-/5 Functional testing 3-/5  Hip adduction      Hip internal rotation      Hip external rotation      Knee flexion Functional testing 3-/5 Functional testing 3-/5  Knee extension Functional testing 3/5 Functional testing 3/5  Ankle dorsiflexion      Ankle plantarflexion      Ankle inversion      Ankle eversion      (Blank rows = not tested)   POSTURE:  06/23/2022  seated - head forward, rounded shoulders and posterior pelvic tilt  TRANSFERS: 05/09/2022:  Sit to stand: SBA and requires use of armrests from 20" stable w/c & needs RW for stabilization 05/09/2022:  Stand to sit: SBA and requires use of armrests from 20" stable w/c & needs RW for stabilization   GAIT: 08/11/2022: Pt amb 100' with rollator, right TTA prosthesis & left AFO partial shoe filler with SBA. He demonstrates increased step length bil., intermittent impaired foot clearance on RLE  08/03/2022: 2-minute Walk Test with RW, right TTA prosthesis & left AFO partial shoe filler. Resting pre HR 70 SpO2 100% HR after activity HR 52  SpO2 100% light amount of light headness Distance 158' with supervision.   Gait pattern: step-to pattern, but distance improved since evaluation, Right hip hike, trunk flexed, and normal 4" BOS  Pt neg ramp & curb with RW, ight TTA prosthesis & left AFO  partial shoe filler with supervision. He required seated rest half way due to fatigue.    05/09/2022:  Gait pattern: step to pattern, decreased step length- Left, decreased stance time- Right, Right hip hike, antalgic, lateral hip instability, trunk flexed, and wide BOS Distance walked: 25' Assistive device utilized: Environmental consultant - 2 wheeled and TTA prosthesis RLE Level of assistance: Min A Comments: pt reports dizziness with turning in gait   UE FUNCTION: 06/23/2022:  Pt reports that he uses RUE for fine motor like writing but uses both hands for gross motor or sports.  Seated - pt unable to raise LUE in flexion or abd due to pain.  Supine - pt unable to move LUE in flexion or abd due to pain.  PROM flexion > pain than abd   Pt tolerates elbow ext with palm supinated but with palm neutral causes shoulder pain. Also painful with elbow already extended and pronate palm.   PT simulated pushing on RW thru LUE with manual resistance.  Pt reports slight increase in pain but not as sharp as above function.    FUNCTIONAL TESTs:  Merrilee Jansky Balance Scale: 08/03/2022:  13/56 & tasks of Berg with RW support 45/56  [facility] PT Assessment - 08/03/22 0800                Berg Balance Test    Sit to Stand Needs minimal aid to stand or to stabilize   task with RW = 3    Standing Unsupported Able to stand 30 seconds unsupported   task with RW = 4  Sitting with Back Unsupported but Feet Supported on Floor or Stool Able to sit safely and securely 2 minutes     Stand to Sit Controls descent by using hands   task with RW = 3    Transfers Able to transfer safely, definite need of hands     Standing Unsupported with Eyes Closed Unable to keep eyes closed 3 seconds but stays steady   task with RW = 4    Standing Unsupported with Feet Together Needs help to attain position and unable to hold for 15 seconds   task with RW = 3    From Standing, Reach Forward with Outstretched Arm Reaches forward but needs supervision   task with  RW = 4    From Standing Position, Pick up Object from Floor Unable to try/needs assist to keep balance   task with RW = 3    From Standing Position, Turn to Look Behind Over each Shoulder Needs assist to keep from losing balance and falling   task with RW = 3    Turn 360 Degrees Needs assistance while turning   task with RW = 2    Standing Unsupported, Alternately Place Feet on Step/Stool Needs assistance to keep from falling or unable to try   task with RW = 4    Standing Unsupported, One Foot in [employer] balance while stepping or standing   task with RW = 3    Standing on One Leg Unable to try or needs assist to prevent fall   task with RW = 4    Total Score 15     Berg comment: tasks of Berg with RW = 45/56        Berg Balance Scale: 05/09/2022:  9/56 & tasks of Berg with RW support 27/56    [facility] PT Assessment - 05/09/22 0930                Berg Balance Test    Sit to Stand Needs moderate or maximal assist to stand   task with RW support = 3    Standing Unsupported Unable to stand 30 seconds unassisted   task with RW support = 3    Sitting with Back Unsupported but Feet Supported on Floor or Stool Able to sit safely and securely 2 minutes     Stand to Sit Uses backs of legs against chair to control descent   task with RW support = 3    Transfers Able to transfer safely, definite need of hands     Standing Unsupported with Eyes Closed Needs help to keep from falling   task with RW support = 3    Standing Unsupported with Feet Together Needs help to attain position and unable to hold for 15 seconds   task with RW support = 2    From Standing, Reach Forward with Outstretched Arm Loses balance while trying/requires external support   task with RW support = 1    From Standing Position, Pick up Object from Floor Unable to try/needs assist to keep balance   task with RW support = 2    From Standing Position, Turn to Look Behind Over each Shoulder Needs assist to keep from losing balance and  falling   task with RW support = 1    Turn 360 Degrees Needs assistance while turning   task with RW support = 0    Standing Unsupported, Alternately Place Feet on Step/Stool Needs assistance to keep from falling  or unable to try   task with RW support = 0    Standing Unsupported, One Foot in [employer] balance while stepping or standing   task with RW support = 1    Standing on One Leg Unable to try or needs assist to prevent fall   task with RW support = 1    Total Score 9     Berg comment: tasks of Berg with RW support 27/56                     CURRENT PROSTHETIC WEAR ASSESSMENT: 08/03/2022: Pt wears both right TTA prosthesis & left AFO partial shoe filler>90% of awake hours.  He has 2 wounds on dorsum of left foot being monitored by PCP.  He is wearing left Vive Wear compression sock below knee on LLE which has helped wounds to heal. Pt is independent in donning & doffing both prosthesis & AFO. Skin check, cleaning and minor cues on adjusting ply socks.   05/09/2022:   Patient is dependent with: skin check, residual limb care, care of non-amputated limb, prosthetic cleaning, ply sock cleaning, and correct ply sock adjustment Donning prosthesis: Modified independence Doffing prosthesis: Modified independence Prosthetic wear tolerance: >90% of awake hours/day, 7 days/week Prosthetic weight bearing tolerance: 5 minutes Edema: LLE pitting edema with >10 sec capillary refill Residual limb condition: Right BKA limb has no openings, normal temperature & moisture.  Redness patella & patella tendon from friction of liner with knee flexed. Cylinderical shape.  LLE TMA pt's wife showed picture of wound on dorsum of foot that is being managed by PCP.   Prosthetic description: silicon liner with pin lock suspension, total contact socket, dynamic response foot.      TODAY'S TREATMENT:  10/05/2022 Neuromuscular Re-ed: Working on step strategy ant, post & lateral right /left : //bar support light  standing crossways on beam - anticipatory stepping off alternating LEs to catch self, progressed to reactionary with PT removing trunk resistance.  -progressed to RW support standing crossways on beam - anticipatory stepping off alternating LEs to catch self, progressed to reactionary with PT removing trunk resistance. Standing crossways on foam beam with single UE support on RW using mirror for upright posture alternating LEs 5 reps ea - forward kicks, abduction, extension & adduction both ant/post to stance LE.  Pt continues to need short frequent rests between activities due to [facility]  10/03/2022 Prosthetic Training with right TTA prosthesis & Left AFO / partial foot prosthesis: PT adjusted velcro attachment points on AFO which improved ability to align front door. PT reviewed proper donning of prosthesis esp rotation alignment & pin alignment. He has blood blisters on medial proximal limb and lateral compartment.  PT recommended using antiperspirant & wiping limb with damp cloth with doffing as probably scratching with itchness when sweaty limb hits air. Pt & wife verbalized understanding.   Neuromuscular Re-ed: Standing feet hip width apart on foam with single UE support on RW with single UE red theraband resistance working on upright posture: 10 reps ea UE for forward reach, row & biceps curl. Standing on foam with single UE support on RW hip width: reaching single UE knee to overhead & reaching behind with trunk rotation following hand with eyes 10 rep ea. Standing on foam with single UE support on RW using mirror for upright posture alternating LEs 5 reps ea - forward kicks, abduction, extension & adduction both ant/post to stance LE. Rocker board with BUE support - ant/level/post &  right/level/left  initially with round single pivot board.  Pt continues to need short frequent rests between activities due to [facility]  09/28/2022 Prosthetic Training with right TTA prosthesis & Left AFO /  partial foot prosthesis: PT verbal cues on assisting for safety on stairs and recommendation for rest point on landing. Pt & wife verbalized understanding.   Pt amb 150' with RW with supervision. He reported he could have gone further energy / breathing wise but his legs felt wobbly. Sit to/from stand from rolling desk chair to simulate porch swing at beach with supervision & cues.  Neuromuscular Re-ed: Standing feet hip width apart on foam with single UE support on RW with single UE red theraband resistance working on upright posture: 10 reps ea UE for forward reach, row & biceps curl. Standing on foam with single UE support on RW hip width: reaching single UE knee to overhead & reaching behind with trunk rotation following hand with eyes 10 rep ea. Standing on foam with single UE support on RW using mirror for upright posture alternating LEs 5 reps ea - forward kicks, abduction, extension & adduction both ant/post to stance LE. Rocker board with BUE support - ant/level/post & right/level/left  initially with round single pivot board.  Pt continues to need short frequent rests between activities due to [place]: [email or web address] URL: [email or web address] Date: 07/05/2022 Prepared by: Jamey Reas  Exercises - Seated Correct Posture  - 1 x daily - 7 x weekly - Seated Scapular Retraction  - 2-5 x daily - 7 x weekly - 1 sets - 10 reps - 5 seconds hold - Supine Chin Tucks on Flat Ball  - 2-5 x daily - 7 x weekly - 1 sets - 10 reps - 5 seconds hold - Seated Isometric Cervical Retraction with Chin Tucks with Pillow behind head  - 2-5 x daily - 7 x weekly - 1 sets - 10 reps - 5 seconds hold - Circular Shoulder Pendulum with Table Support  - 1-2 x daily - 7 x weekly - 1-2 sets - 10 reps - 5 seconds hold - Flexion-Extension Shoulder Pendulum with Table Support  - 1-2 x daily - 7 x weekly - 1-2 sets - 10 reps - Horizontal Shoulder Pendulum with Table  Support  - 1-2 x daily - 7 x weekly - 1-2 sets - 10 reps - Supine Shoulder Flexion Extension AAROM with Dowel  - 1 x daily - 7 x weekly - 1-2 sets - 10 reps - 5 seconds hold - Supine Shoulder Abduction AAROM with Dowel  - 1 x daily - 7 x weekly - 1-2 sets - 10 reps - 5 seconds hold - Supine Shoulder External Rotation with Dowel  - 1 x daily - 7 x weekly - 1-2 sets - 10 reps - 5 seconds hold   Neuromuscular Reed-  cues on proprioception RLE using residual limb, balance & standing tolerance.   Do each exercise 1-2  times per day Do each exercise 5-10 repetitions Hold each exercise for 2 seconds to feel your location  AT [place].  Try to find this position when standing still for activities.   USE TAPE ON FLOOR TO MARK THE MIDLINE POSITION which is even with middle of sink.  You also should try to feel with your limb pressure in socket.  You are trying to feel with limb what you used to  feel with the bottom of your foot.  Side to Side Shift: Moving your hips only (not shoulders): move weight onto your left leg, HOLD/FEEL pressure in socket.  Move back to equal weight on each leg, HOLD/FEEL pressure in socket. Move weight onto your right leg, HOLD/FEEL pressure in socket. Move back to equal weight on each leg, HOLD/FEEL pressure in socket. Repeat.  Start with both hands on sink, progress to hand on prosthetic side only, then no hands.  Front to Back Shift: Moving your hips only (not shoulders): move your weight forward onto your toes, HOLD/FEEL pressure in socket. Move your weight back to equal Flat Foot on both legs, HOLD/FEEL  pressure in socket. Move your weight back onto your heels, HOLD/FEEL  pressure in socket. Move your weight back to equal on both legs, HOLD/FEEL  pressure in socket. Repeat.  Start with both hands on sink, progress to hand on prosthetic side only, then no hands.  Moving Cones / Cups: With equal weight on  each leg: Hold on with one hand the first time, then progress to no hand supports. Move cups from one side of sink to the other. Place cups ~2" out of your reach, progress to 10" beyond reach.  Place one hand in middle of sink and reach with other hand. Do both arms.  Then hover one hand and move cups with other hand.  Overhead/Upward Reaching: alternated reaching up to top cabinets or ceiling if no cabinets present. Keep equal weight on each leg. Start with one hand support on counter while other hand reaches and progress to no hand support with reaching.  ace one hand in middle of sink and reach with other hand. Do both arms.  Then hover one hand and move cups with other hand.  5.   Looking Over Shoulders: With equal weight on each leg: alternate turning to look over your shoulders with one hand support on counter as needed.  Start with head motions only to look in front of shoulder, then even with shoulder and progress to looking behind you. To look to side, move head /eyes, then shoulder on side looking pulls back, shift more weight to side looking and pull hip back. Place one hand in middle of sink and let go with other hand so your shoulder can pull back. Switch hands to look other way.   Then hover one hand and look over shoulder. If looking right, use left hand at sink. If looking left, use right hand at sink. 6.  Alternate Tapping Toes or Stepping to lower shelf of cabinet  Move items under cabinet out of your way. Shift your hips/pelvis so weight on stance leg & Tighten muscles in hip.  SLOWLY step other leg so front of foot is in cabinet. Then step back to floor. Repeat with other leg.     ASSESSMENT:   CLINICAL IMPRESSION: PT worked on step strategy progressing from anticipatory to reactionary.  He improved with PT cueing and repetition.  Pt continues to benefit from skilled PT  OBJECTIVE IMPAIRMENTS .Abnormal gait, cardiopulmonary status limiting activity, decreased activity tolerance,  decreased balance, decreased endurance, decreased knowledge of use of DME, decreased mobility, decreased ROM, decreased strength, dizziness, increased edema, impaired flexibility, postural dysfunction, prosthetic dependency , obesity, and pain.    ACTIVITY LIMITATIONS carrying, lifting, bending, standing, stairs, transfers, locomotion level, UE use with ADLs, sleep    PARTICIPATION LIMITATIONS: driving, community activity, and yard work, UE use with ADLs &  household activities  PERSONAL FACTORS Fitness, Time since onset of injury/illness/exacerbation, and 3+ comorbidities: see PMH  are also affecting patient's functional outcome.    REHAB POTENTIAL: Good   CLINICAL DECISION MAKING: Evolving/moderate complexity   EVALUATION COMPLEXITY: Moderate     GOALS: Goals reviewed with patient? Yes  SHORT TERM GOALS: Target date: 09/29/2022  1. Patient able to pick up items from floor with single UE support on walker or surface. Baseline: SEE OBJECTIVE DATA Goal status: met 09/26/2022  2. Patient reports ambulating in home & entering/exiting home >90% of time over w/c use.  Baseline: SEE OBJECTIVE DATA Goal status: partially met 09/28/2022  3. Patient ambulates up to 200' with RW, prosthesis & AFO with supervision.  Goal status: partially met to level of supervision but not distance 09/28/2022     UPDATED LONG TERM GOALS: Target date: 10/26/2022   Patient demonstrates & verbalized understanding of prosthetic care to enable safe utilization of prosthesis. Baseline: SEE OBJECTIVE DATA Goal status: ongoing 08/03/22    Patient tolerates right prosthesis & left AFO / partial foot prosthesis wear >90% of awake hours without skin or limb pain issues. Baseline: SEE OBJECTIVE DATA Goal status: ongoing 08/03/22    Berg Balance >/= 20/56 and tasks of Berg with RW support >/= 52/56 to indicate lower fall risk with standing ADLs Baseline: SEE OBJECTIVE DATA Goal status: 08/03/2022 updated     Patient ambulates >300' with prosthesis & LRAD modified independent Baseline: SEE OBJECTIVE DATA Goal status: ongoing 08/03/2022   Patient negotiates ramps, curbs & stairs with single rail with prosthesis & LRAD modified independent. Baseline: SEE OBJECTIVE DATA Goal status: ongoing 08/03/22  PLAN: PT FREQUENCY: 2x/week   PT DURATION: 12 weeks  PLANNED INTERVENTIONS: Therapeutic exercises, Therapeutic activity, Neuromuscular re-education, Balance training, Gait training, Patient/Family education, Self Care, Stair training, Vestibular training, Canalith repositioning, Prosthetic training, DME instructions, Re-evaluation, and physical performance testing    PLAN FOR NEXT SESSION:    Progress standing balance working on compliant surfaces holding RW and prosthetic gait with rollator as tolerated with monitoring his respiratory response.   Jamey Reas, PT, DPT 10/05/2022, 11:12 AM

## 2022-10-10 ENCOUNTER — Ambulatory Visit (INDEPENDENT_AMBULATORY_CARE_PROVIDER_SITE_OTHER): Payer: Medicare Other | Admitting: Physical Therapy

## 2022-10-10 ENCOUNTER — Encounter: Payer: Self-pay | Admitting: Physical Therapy

## 2022-10-10 DIAGNOSIS — M6281 Muscle weakness (generalized): Secondary | ICD-10-CM | POA: Diagnosis not present

## 2022-10-10 DIAGNOSIS — R2681 Unsteadiness on feet: Secondary | ICD-10-CM

## 2022-10-10 DIAGNOSIS — R293 Abnormal posture: Secondary | ICD-10-CM

## 2022-10-10 DIAGNOSIS — R2689 Other abnormalities of gait and mobility: Secondary | ICD-10-CM

## 2022-10-10 NOTE — Therapy (Signed)
OUTPATIENT PHYSICAL THERAPY TREATMENT NOTE   Patient Name: Shane [name] MRN: [id number] DOB:11-08-1941, 81 y.o., male Today's Date: 10/10/2022  PCP: Burnard Bunting, MD REFERRING PROVIDER: [name], MD    END OF SESSION:   PT End of Session - 10/10/22 0804     Visit Number 42    Number of Visits 16    Date for PT Re-Evaluation 10/26/22    Authorization Type UHC Medicare    Authorization Time Period UHC MEDICARE  $20 COPAY    Progress Note Due on Visit 29    PT Start Time 0800    PT Stop Time 0842    PT Time Calculation (min) 42 min    Equipment Utilized During Treatment Gait belt    Activity Tolerance Patient tolerated treatment well;Patient limited by fatigue    Behavior During Therapy WFL for tasks assessed/performed                        Past Medical History:  Diagnosis Date   Anxiety    AV block, Mobitz 1    Cataract    Chronic kidney disease    d/t DM   CKD (chronic kidney disease), stage III ([place])    Depression    Diabetes mellitus    Vgo disposal insulin bolus  simular to insulin pump   Dyspnea    GERD (gastroesophageal reflux disease)    History of kidney stones    passed   Hyperlipidemia    Hypertension    Idiopathic pulmonary fibrosis ([place]) 11/2016   ILD (interstitial lung disease) ([place])    Moderate aortic stenosis    a. 10/2019 Echo: EF 55-60%, Gr2 DD. Nl RV.    Neuromuscular disorder ([place])    Neuropathy associated with endocrine disorder ([place])    Nonobstructive CAD (coronary artery disease)    a. 2012 Cath: mod, nonobs dzs; b. 10/2016 MV: EF 60%, no ischemia.   OSA on CPAP 05/05/2017   Unattended Home Sleep Test 7/2/813-AHI 38.6/hour, desaturation to 64%, body weight 261 pounds   PONV (postoperative nausea and vomiting)    Sleep apnea     uses cpap asked to bring mask and tubing   Past Surgical History:  Procedure Laterality Date   ABDOMINAL AORTOGRAM W/LOWER EXTREMITY N/A 12/10/2020   Procedure: ABDOMINAL AORTOGRAM  W/LOWER EXTREMITY;  Surgeon: Cherre Robins, MD;  Location: [place] [facility];  Service: Cardiovascular;  Laterality: N/A;   AMPUTATION Right 01/22/2021   Procedure: 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;  Surgeon: [name], MD;  Location: [place];  Service: Orthopedics;  Laterality: Right;   AMPUTATION Right 03/17/2021   Procedure: RIGHT BELOW KNEE AMPUTATION;  Surgeon: [name], MD;  Location: [place];  Service: Orthopedics;  Laterality: Right;   ANKLE FUSION Right 01/22/2021   Procedure: RIGHT FOOT TIBIOCALCANEAL FUSION;  Surgeon: [name], MD;  Location: [place];  Service: Orthopedics;  Laterality: Right;   ANTERIOR FUSION CERVICAL SPINE  2012   CARDIAC CATHETERIZATION  2011   CARDIAC CATHETERIZATION N/A 11/09/2016   Procedure: Right Heart Cath;  Surgeon: Belva Crome, MD;  Location: [place] [facility];  Service: Cardiovascular;  Laterality: N/A;   carpel tunnel     left wrist   CATARACT EXTRACTION     CATARACT EXTRACTION W/ INTRAOCULAR LENS  IMPLANT, BILATERAL  2013   CERVICAL LAMINECTOMY  2012   COLONOSCOPY N/A 01/14/2013   Procedure: COLONOSCOPY;  Surgeon: Irene Shipper, MD;  Location: [facility];  Service: Endoscopy;  Laterality: N/A;   CORONARY ARTERY BYPASS GRAFT  11/04/2020   LIMA-LAD, SVG-OM1, SVG-PDA (Dr Marney [facility]) dc 11/18/2020   EYE SURGERY     I & D EXTREMITY Right 02/19/2021   Procedure: RIGHT ANKLE DEBRIDEMENT AND PLACEMENT ANTIBIOTIC BEADS;  Surgeon: [name], MD;  Location: [place];  Service: Orthopedics;  Laterality: Right;   KNEE SURGERY  1998   left   LEFT HEART CATH AND CORONARY ANGIOGRAPHY N/A 07/10/2020   Procedure: LEFT HEART CATH AND CORONARY ANGIOGRAPHY;  Surgeon: Sherren Mocha, MD;  Location: [place] [facility];  Service: Cardiovascular;  Laterality: N/A;   LUMBAR LAMINECTOMY  2003   LUNG BIOPSY Left 12/26/2016   Procedure: LUNG BIOPSY;  Surgeon: [name], MD;  Location: [place];  Service: Thoracic;  Laterality: Left;   PACEMAKER IMPLANT  N/A 03/30/2020   Procedure: PACEMAKER IMPLANT;  Surgeon: Evans Lance, MD;  Location: [place] [facility];  Service: Cardiovascular;  Laterality: N/A;   PERIPHERAL VASCULAR INTERVENTION Right 12/10/2020   Procedure: PERIPHERAL VASCULAR INTERVENTION;  Surgeon: Cherre Robins, MD;  Location: [place] [facility];  Service: Cardiovascular;  Laterality: Right;  SFA   POSTERIOR FUSION CERVICAL SPINE  2012   PPM GENERATOR REMOVAL N/A 12/14/2020   Procedure: PPM GENERATOR REMOVAL;  Surgeon: Evans Lance, MD;  Location: [place] [facility];  Service: Cardiovascular;  Laterality: N/A;   TEE WITHOUT CARDIOVERSION N/A 12/11/2020   Procedure: TRANSESOPHAGEAL ECHOCARDIOGRAM (TEE);  Surgeon: Geralynn Rile, MD;  Location: [place];  Service: Cardiovascular;  Laterality: N/A;   TRIGGER FINGER RELEASE  2011   4th finger left hand   VIDEO ASSISTED THORACOSCOPY Left 12/26/2016   Procedure: VIDEO ASSISTED THORACOSCOPY;  Surgeon: [name], MD;  Location: [place];  Service: Thoracic;  Laterality: Left;   VIDEO BRONCHOSCOPY N/A 12/26/2016   Procedure: VIDEO BRONCHOSCOPY;  Surgeon: [name], MD;  Location: [place];  Service: Thoracic;  Laterality: N/A;   Patient Active Problem List   Diagnosis Date Noted   Intermediate stage nonexudative age-related macular degeneration of both eyes 04/25/2022   Research study patient 12/24/2021   Post-operative pain    CKD (chronic kidney disease), stage II    Labile blood glucose    Pulmonary fibrosis ([place])    Hyponatremia    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[place]) 03/26/2021   S/P BKA (below knee amputation) unilateral, right ([place])    Shortness of breath    Acute blood loss anemia    Stage 3 chronic kidney disease ([place])    Atrial fibrillation ([place])    Controlled type 2 diabetes mellitus with hyperglycemia, with long-term current use of insulin ([facility])    Postoperative pain    Postoperative hemorrhagic shock 03/20/2021   Atherosclerosis of native  arteries of extremities with gangrene, right leg ([place])    Hardware complicating wound infection ([place])    Abscess of bursa of right ankle 02/15/2021   Endocarditis 01/19/2021   Peripheral vascular disease ([place]) 12/29/2020   CHF (congestive heart failure), NYHA class II, chronic, diastolic ([place]) 72/53/6644   History of COVID-19 12/22/2020   SSS (sick sinus syndrome) ([place]) 12/22/2020   Acute bacterial endocarditis    MSSA bacteremia 12/09/2020   Diabetic foot ulcer ([place]) 12/09/2020   Foot drop, right foot    Generalized weakness 12/08/2020   Dehydration with hyponatremia 12/08/2020   Atrial fibrillation, chronic ([place]) 12/08/2020   Elevated troponin level not due myocardial  infarction 12/08/2020   Adrenal insufficiency ([place]) 11/14/2020   Orthostatic hypotension 11/14/2020   Physical deconditioning 11/14/2020   Difficulty sleeping 11/07/2020   Postoperative anemia due to acute blood loss 11/07/2020   Right ventricular dysfunction 11/05/2020   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/04/2020   Hearing loss 11/03/2020   Cardiac device in situ 09/09/2020   Coronary atherosclerosis due to lipid rich plaque 09/02/2020   COVID-19 virus infection 07/18/2020   Coronary artery disease involving native heart without angina pectoris 07/10/2020   Chronic kidney disease, stage 3a ([place]) 03/29/2020   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/28/2020   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)  01/06/2018   Syncope and collapse 01/05/2018   Encounter for therapeutic drug monitoring 06/29/2017   OSA on CPAP 05/05/2017   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[place]) 01/05/2017   Abnormal chest x-ray 10/12/2016   Benign neoplasm of colon 01/14/2013    REFERRING DIAG: Z89.511 (ICD-10-CM) - Hx of right BKA   ONSET DATE: 05/04/2022 MD visit with release to [facility] on LLE  THERAPY DIAG:  Unsteadiness on feet  Other abnormalities of gait and mobility  Muscle weakness (generalized)  Abnormal posture  Rationale for Evaluation and Treatment Rehabilitation  PERTINENT HISTORY: CKD st III, AV block Mobitz, pacemaker, pulmonary fibrosis, CAD, CABG 21, HTN, A-Fib, DM2, PAD, right BKA, Left TMA  PRECAUTIONS: ICD/Pacemaker - continue monitoring vitals with activity and calculated HRmax is 151 bpm  SUBJECTIVE:  He did well at beach climbing stairs and sitting/standing from swing.  He did have some issues getting off low recliner at end of day when fatigued.  PAIN:    Are you having pain? Yes: NPRS scale: today  0/10, over last week lowest 0/10  Pain location: left shoulder lateral just distal to joint Pain description: tingling when shoulder in improper posture. Aggravating factors: improper posture. Relieving factors: correcting posture   OBJECTIVE: (objective measures completed at initial evaluation unless otherwise dated)  COGNITION: Overall cognitive status: Within functional limits for tasks assessed   POSTURE: 05/09/2022:  rounded shoulders, forward head, and flexed trunk    LOWER EXTREMITY ROM:    UPPER EXTREMITY ROM:  ROM P:passive  A:active Left 06/23/22 Supine all limited by pain Left 08/01/22 Seated All painfree Right 08/01/22 Seated  Shoulder flexion 30* A: 148 A: 153  Shoulder extension 0* neutral A: 72 A: 74  Shoulder abduction 45* A: 135 A: 141  Shoulder adduction     Shoulder internal rotation 20* A: 84 A: 89  Shoulder external rotation 10* A: 88 A: 83  Elbow  flexion     Elbow extension -15*     (Blank rows = not tested)    ROM P:passive  A:active Right Eval 05/09/2022 Left Eval 05/09/2022 Bilateral 08/03/22  Hip flexion       Hip extension A: -25* standing A: -25* standing A: -15*  RW support standing  Hip abduction       Hip adduction       Hip internal  rotation       Hip external rotation       Knee flexion       Knee extension       Ankle dorsiflexion NA P: -17*   Ankle plantarflexion NA     Ankle inversion NA     Ankle eversion NA      (Blank rows = not tested)   LOWER EXTREMITY MMT:   MMT Right Eval 05/09/2022 Left Eval 05/09/2022  Hip flexion      Hip extension Functional testing 3-/5 Functional testing 3-/5  Hip abduction Functional testing 3-/5 Functional testing 3-/5  Hip adduction      Hip internal rotation      Hip external rotation      Knee flexion Functional testing 3-/5 Functional testing 3-/5  Knee extension Functional testing 3/5 Functional testing 3/5  Ankle dorsiflexion      Ankle plantarflexion      Ankle inversion      Ankle eversion      (Blank rows = not tested)   POSTURE:  06/23/2022  seated - head forward, rounded shoulders and posterior pelvic tilt  TRANSFERS: 05/09/2022:  Sit to stand: SBA and requires use of armrests from 20" stable w/c & needs RW for stabilization 05/09/2022:  Stand to sit: SBA and requires use of armrests from 20" stable w/c & needs RW for stabilization   GAIT: 08/11/2022: Pt amb 100' with rollator, right TTA prosthesis & left AFO partial shoe filler with SBA. He demonstrates increased step length bil., intermittent impaired foot clearance on RLE  08/03/2022: 2-minute Walk Test with RW, right TTA prosthesis & left AFO partial shoe filler. Resting pre HR 70 SpO2 100% HR after activity HR 52  SpO2 100% light amount of light headness Distance 158' with supervision.   Gait pattern: step-to pattern, but distance improved since evaluation, Right hip hike, trunk flexed, and normal 4"  BOS  Pt neg ramp & curb with RW, ight TTA prosthesis & left AFO partial shoe filler with supervision. He required seated rest half way due to fatigue.    05/09/2022:  Gait pattern: step to pattern, decreased step length- Left, decreased stance time- Right, Right hip hike, antalgic, lateral hip instability, trunk flexed, and wide BOS Distance walked: 25' Assistive device utilized: Environmental consultant - 2 wheeled and TTA prosthesis RLE Level of assistance: Min A Comments: pt reports dizziness with turning in gait   UE FUNCTION: 06/23/2022:  Pt reports that he uses RUE for fine motor like writing but uses both hands for gross motor or sports.  Seated - pt unable to raise LUE in flexion or abd due to pain.  Supine - pt unable to move LUE in flexion or abd due to pain.  PROM flexion > pain than abd   Pt tolerates elbow ext with palm supinated but with palm neutral causes shoulder pain. Also painful with elbow already extended and pronate palm.   PT simulated pushing on RW thru LUE with manual resistance.  Pt reports slight increase in pain but not as sharp as above function.    FUNCTIONAL TESTs:  Merrilee Jansky Balance Scale: 08/03/2022:  13/56 & tasks of Berg with RW support 45/56  [facility] PT Assessment - 08/03/22 0800                Berg Balance Test    Sit to Stand Needs minimal aid to stand or to stabilize   task with RW = 3    Standing Unsupported  Able to stand 30 seconds unsupported   task with RW = 4    Sitting with Back Unsupported but Feet Supported on Floor or Stool Able to sit safely and securely 2 minutes     Stand to Sit Controls descent by using hands   task with RW = 3    Transfers Able to transfer safely, definite need of hands     Standing Unsupported with Eyes Closed Unable to keep eyes closed 3 seconds but stays steady   task with RW = 4    Standing Unsupported with Feet Together Needs help to attain position and unable to hold for 15 seconds   task with RW = 3    From Standing, Reach Forward with  Outstretched Arm Reaches forward but needs supervision   task with RW = 4    From Standing Position, Pick up Object from Floor Unable to try/needs assist to keep balance   task with RW = 3    From Standing Position, Turn to Look Behind Over each Shoulder Needs assist to keep from losing balance and falling   task with RW = 3    Turn 360 Degrees Needs assistance while turning   task with RW = 2    Standing Unsupported, Alternately Place Feet on Step/Stool Needs assistance to keep from falling or unable to try   task with RW = 4    Standing Unsupported, One Foot in [employer] balance while stepping or standing   task with RW = 3    Standing on One Leg Unable to try or needs assist to prevent fall   task with RW = 4    Total Score 15     Berg comment: tasks of Berg with RW = 45/56        Berg Balance Scale: 05/09/2022:  9/56 & tasks of Berg with RW support 27/56    [facility] PT Assessment - 05/09/22 0930                Berg Balance Test    Sit to Stand Needs moderate or maximal assist to stand   task with RW support = 3    Standing Unsupported Unable to stand 30 seconds unassisted   task with RW support = 3    Sitting with Back Unsupported but Feet Supported on Floor or Stool Able to sit safely and securely 2 minutes     Stand to Sit Uses backs of legs against chair to control descent   task with RW support = 3    Transfers Able to transfer safely, definite need of hands     Standing Unsupported with Eyes Closed Needs help to keep from falling   task with RW support = 3    Standing Unsupported with Feet Together Needs help to attain position and unable to hold for 15 seconds   task with RW support = 2    From Standing, Reach Forward with Outstretched Arm Loses balance while trying/requires external support   task with RW support = 1    From Standing Position, Pick up Object from Floor Unable to try/needs assist to keep balance   task with RW support = 2    From Standing Position, Turn to Look  Behind Over each Shoulder Needs assist to keep from losing balance and falling   task with RW support = 1    Turn 360 Degrees Needs assistance while turning   task with RW support = 0  Standing Unsupported, Alternately Place Feet on Step/Stool Needs assistance to keep from falling or unable to try   task with RW support = 0    Standing Unsupported, One Foot in [employer] balance while stepping or standing   task with RW support = 1    Standing on One Leg Unable to try or needs assist to prevent fall   task with RW support = 1    Total Score 9     Berg comment: tasks of Berg with RW support 27/56                     CURRENT PROSTHETIC WEAR ASSESSMENT: 08/03/2022: Pt wears both right TTA prosthesis & left AFO partial shoe filler>90% of awake hours.  He has 2 wounds on dorsum of left foot being monitored by PCP.  He is wearing left Vive Wear compression sock below knee on LLE which has helped wounds to heal. Pt is independent in donning & doffing both prosthesis & AFO. Skin check, cleaning and minor cues on adjusting ply socks.   05/09/2022:   Patient is dependent with: skin check, residual limb care, care of non-amputated limb, prosthetic cleaning, ply sock cleaning, and correct ply sock adjustment Donning prosthesis: Modified independence Doffing prosthesis: Modified independence Prosthetic wear tolerance: >90% of awake hours/day, 7 days/week Prosthetic weight bearing tolerance: 5 minutes Edema: LLE pitting edema with >10 sec capillary refill Residual limb condition: Right BKA limb has no openings, normal temperature & moisture.  Redness patella & patella tendon from friction of liner with knee flexed. Cylinderical shape.  LLE TMA pt's wife showed picture of wound on dorsum of foot that is being managed by PCP.   Prosthetic description: silicon liner with pin lock suspension, total contact socket, dynamic response foot.      TODAY'S TREATMENT:  10/10/2022 Neuromuscular Re-ed: Working  on step strategy ant, post & lateral right /left with RW support standing crossways on beam - anticipatory stepping off alternating LEs to catch self, progressed to reactionary with PT removing trunk resistance. Standing crossways on foam beam with single UE support on RW using mirror for upright posture alternating LEs 5 reps ea - forward kicks, abduction, extension & adduction both ant/post to stance LE.  Therapeutic Exercise: PT reviewed need for ongoing exercise program post discharge to include flexibility, strength, balance & endurance.  PT gave recommendations for incorporating into his routine. Pt & wife verbalized understanding. Step up & step down on 4" block with BUE support leading with ea LE 5 reps. PT cued on how to incorporate into his HEP. Pt & wife verbalized understanding.   Pt continues to need short frequent rests between activities due to [facility]  10/05/2022 Neuromuscular Re-ed: Working on step strategy ant, post & lateral right /left : //bar support light standing crossways on beam - anticipatory stepping off alternating LEs to catch self, progressed to reactionary with PT removing trunk resistance.  -progressed to RW support standing crossways on beam - anticipatory stepping off alternating LEs to catch self, progressed to reactionary with PT removing trunk resistance. Standing crossways on foam beam with single UE support on RW using mirror for upright posture alternating LEs 5 reps ea - forward kicks, abduction, extension & adduction both ant/post to stance LE.  Pt continues to need short frequent rests between activities due to [facility]  10/03/2022 Prosthetic Training with right TTA prosthesis & Left AFO / partial foot prosthesis: PT adjusted velcro attachment points on AFO which improved  ability to align front door. PT reviewed proper donning of prosthesis esp rotation alignment & pin alignment. He has blood blisters on medial proximal limb and lateral compartment.  PT  recommended using antiperspirant & wiping limb with damp cloth with doffing as probably scratching with itchness when sweaty limb hits air. Pt & wife verbalized understanding.   Neuromuscular Re-ed: Standing feet hip width apart on foam with single UE support on RW with single UE red theraband resistance working on upright posture: 10 reps ea UE for forward reach, row & biceps curl. Standing on foam with single UE support on RW hip width: reaching single UE knee to overhead & reaching behind with trunk rotation following hand with eyes 10 rep ea. Standing on foam with single UE support on RW using mirror for upright posture alternating LEs 5 reps ea - forward kicks, abduction, extension & adduction both ant/post to stance LE. Rocker board with BUE support - ant/level/post & right/level/left  initially with round single pivot board.  Pt continues to need short frequent rests between activities due to [place]: [email or web address] URL: [email or web address] Date: 07/05/2022 Prepared by: Jamey Reas  Exercises - Seated Correct Posture  - 1 x daily - 7 x weekly - Seated Scapular Retraction  - 2-5 x daily - 7 x weekly - 1 sets - 10 reps - 5 seconds hold - Supine Chin Tucks on Flat Ball  - 2-5 x daily - 7 x weekly - 1 sets - 10 reps - 5 seconds hold - Seated Isometric Cervical Retraction with Chin Tucks with Pillow behind head  - 2-5 x daily - 7 x weekly - 1 sets - 10 reps - 5 seconds hold - Circular Shoulder Pendulum with Table Support  - 1-2 x daily - 7 x weekly - 1-2 sets - 10 reps - 5 seconds hold - Flexion-Extension Shoulder Pendulum with Table Support  - 1-2 x daily - 7 x weekly - 1-2 sets - 10 reps - Horizontal Shoulder Pendulum with Table Support  - 1-2 x daily - 7 x weekly - 1-2 sets - 10 reps - Supine Shoulder Flexion Extension AAROM with Dowel  - 1 x daily - 7 x weekly - 1-2 sets - 10 reps - 5 seconds hold - Supine Shoulder Abduction AAROM with  Dowel  - 1 x daily - 7 x weekly - 1-2 sets - 10 reps - 5 seconds hold - Supine Shoulder External Rotation with Dowel  - 1 x daily - 7 x weekly - 1-2 sets - 10 reps - 5 seconds hold   Neuromuscular Reed-  cues on proprioception RLE using residual limb, balance & standing tolerance.   Do each exercise 1-2  times per day Do each exercise 5-10 repetitions Hold each exercise for 2 seconds to feel your location  AT [place].  Try to find this position when standing still for activities.   USE TAPE ON FLOOR TO MARK THE MIDLINE POSITION which is even with middle of sink.  You also should try to feel with your limb pressure in socket.  You are trying to feel with limb what you used to feel with the bottom of your foot.  Side to Side Shift: Moving your hips only (not shoulders): move weight onto your left leg, HOLD/FEEL pressure in socket.  Move back to equal weight on each leg, HOLD/FEEL pressure in socket. Move weight  onto your right leg, HOLD/FEEL pressure in socket. Move back to equal weight on each leg, HOLD/FEEL pressure in socket. Repeat.  Start with both hands on sink, progress to hand on prosthetic side only, then no hands.  Front to Back Shift: Moving your hips only (not shoulders): move your weight forward onto your toes, HOLD/FEEL pressure in socket. Move your weight back to equal Flat Foot on both legs, HOLD/FEEL  pressure in socket. Move your weight back onto your heels, HOLD/FEEL  pressure in socket. Move your weight back to equal on both legs, HOLD/FEEL  pressure in socket. Repeat.  Start with both hands on sink, progress to hand on prosthetic side only, then no hands.  Moving Cones / Cups: With equal weight on each leg: Hold on with one hand the first time, then progress to no hand supports. Move cups from one side of sink to the other. Place cups ~2" out of your reach, progress to 10" beyond reach.  Place one hand in  middle of sink and reach with other hand. Do both arms.  Then hover one hand and move cups with other hand.  Overhead/Upward Reaching: alternated reaching up to top cabinets or ceiling if no cabinets present. Keep equal weight on each leg. Start with one hand support on counter while other hand reaches and progress to no hand support with reaching.  ace one hand in middle of sink and reach with other hand. Do both arms.  Then hover one hand and move cups with other hand.  5.   Looking Over Shoulders: With equal weight on each leg: alternate turning to look over your shoulders with one hand support on counter as needed.  Start with head motions only to look in front of shoulder, then even with shoulder and progress to looking behind you. To look to side, move head /eyes, then shoulder on side looking pulls back, shift more weight to side looking and pull hip back. Place one hand in middle of sink and let go with other hand so your shoulder can pull back. Switch hands to look other way.   Then hover one hand and look over shoulder. If looking right, use left hand at sink. If looking left, use right hand at sink. 6.  Alternate Tapping Toes or Stepping to lower shelf of cabinet  Move items under cabinet out of your way. Shift your hips/pelvis so weight on stance leg & Tighten muscles in hip.  SLOWLY step other leg so front of foot is in cabinet. Then step back to floor. Repeat with other leg.     ASSESSMENT:   CLINICAL IMPRESSION: Pt reports able to get in/out of beach house and function safely which was one of his goals.  He is on target to meet LTGs in next 2 weeks with PT anticipating discharge on 10/26/2022.  Pt continues to benefit from skilled PT to maximize function & safety.    OBJECTIVE IMPAIRMENTS .Abnormal gait, cardiopulmonary status limiting activity, decreased activity tolerance, decreased balance, decreased endurance, decreased knowledge of use of DME, decreased mobility, decreased ROM,  decreased strength, dizziness, increased edema, impaired flexibility, postural dysfunction, prosthetic dependency , obesity, and pain.    ACTIVITY LIMITATIONS carrying, lifting, bending, standing, stairs, transfers, locomotion level, UE use with ADLs, sleep    PARTICIPATION LIMITATIONS: driving, community activity, and yard work, UE use with ADLs &  household activities   [place], Time since onset of injury/illness/exacerbation, and 3+ comorbidities: see PMH  are also affecting  patient's functional outcome.    REHAB POTENTIAL: Good   CLINICAL DECISION MAKING: Evolving/moderate complexity   EVALUATION COMPLEXITY: Moderate     GOALS: Goals reviewed with patient? Yes  SHORT TERM GOALS: Target date: 09/29/2022  1. Patient able to pick up items from floor with single UE support on walker or surface. Baseline: SEE OBJECTIVE DATA Goal status: met 09/26/2022  2. Patient reports ambulating in home & entering/exiting home >90% of time over w/c use.  Baseline: SEE OBJECTIVE DATA Goal status: partially met 09/28/2022  3. Patient ambulates up to 200' with RW, prosthesis & AFO with supervision.  Goal status: partially met to level of supervision but not distance 09/28/2022     UPDATED LONG TERM GOALS: Target date: 10/26/2022   Patient demonstrates & verbalized understanding of prosthetic care to enable safe utilization of prosthesis. Baseline: SEE OBJECTIVE DATA Goal status: ongoing 08/03/22    Patient tolerates right prosthesis & left AFO / partial foot prosthesis wear >90% of awake hours without skin or limb pain issues. Baseline: SEE OBJECTIVE DATA Goal status: ongoing 08/03/22    Berg Balance >/= 20/56 and tasks of Berg with RW support >/= 52/56 to indicate lower fall risk with standing ADLs Baseline: SEE OBJECTIVE DATA Goal status: 08/03/2022 updated    Patient ambulates >300' with prosthesis & LRAD modified independent Baseline: SEE OBJECTIVE DATA Goal status:  ongoing 08/03/2022   Patient negotiates ramps, curbs & stairs with single rail with prosthesis & LRAD modified independent. Baseline: SEE OBJECTIVE DATA Goal status: ongoing 08/03/22  PLAN: PT FREQUENCY: 2x/week   PT DURATION: 12 weeks  PLANNED INTERVENTIONS: Therapeutic exercises, Therapeutic activity, Neuromuscular re-education, Balance training, Gait training, Patient/Family education, Self Care, Stair training, Vestibular training, Canalith repositioning, Prosthetic training, DME instructions, Re-evaluation, and physical performance testing    PLAN FOR NEXT SESSION:    work towards [place], continue to instruct in ongoing HEP / functional activities.    Jamey Reas, PT, DPT 10/10/2022, 5:01 PM

## 2022-10-12 ENCOUNTER — Encounter: Payer: Self-pay | Admitting: Physical Therapy

## 2022-10-12 ENCOUNTER — Ambulatory Visit (INDEPENDENT_AMBULATORY_CARE_PROVIDER_SITE_OTHER): Payer: Medicare Other | Admitting: Physical Therapy

## 2022-10-12 DIAGNOSIS — M6281 Muscle weakness (generalized): Secondary | ICD-10-CM

## 2022-10-12 DIAGNOSIS — R293 Abnormal posture: Secondary | ICD-10-CM

## 2022-10-12 DIAGNOSIS — R2681 Unsteadiness on feet: Secondary | ICD-10-CM | POA: Diagnosis not present

## 2022-10-12 DIAGNOSIS — R2689 Other abnormalities of gait and mobility: Secondary | ICD-10-CM | POA: Diagnosis not present

## 2022-10-12 NOTE — Therapy (Signed)
OUTPATIENT PHYSICAL THERAPY TREATMENT NOTE   Patient Name: Shane [name] MRN: [id number] DOB:1940-12-17, 81 y.o., male Today's Date: 10/12/2022  PCP: Burnard Bunting, MD REFERRING PROVIDER: [name], MD    END OF SESSION:   PT End of Session - 10/12/22 0802     Visit Number 43    Number of Visits 47    Date for PT Re-Evaluation 10/26/22    Authorization Type UHC Medicare    Authorization Time Period UHC MEDICARE  $20 COPAY    Progress Note Due on Visit 54    PT Start Time 0800    PT Stop Time 0843    PT Time Calculation (min) 43 min    Equipment Utilized During Treatment Gait belt    Activity Tolerance Patient tolerated treatment well;Patient limited by fatigue    Behavior During Therapy WFL for tasks assessed/performed                         Past Medical History:  Diagnosis Date   Anxiety    AV block, Mobitz 1    Cataract    Chronic kidney disease    d/t DM   CKD (chronic kidney disease), stage III ([place])    Depression    Diabetes mellitus    Vgo disposal insulin bolus  simular to insulin pump   Dyspnea    GERD (gastroesophageal reflux disease)    History of kidney stones    passed   Hyperlipidemia    Hypertension    Idiopathic pulmonary fibrosis ([place]) 11/2016   ILD (interstitial lung disease) ([place])    Moderate aortic stenosis    a. 10/2019 Echo: EF 55-60%, Gr2 DD. Nl RV.    Neuromuscular disorder ([place])    Neuropathy associated with endocrine disorder ([place])    Nonobstructive CAD (coronary artery disease)    a. 2012 Cath: mod, nonobs dzs; b. 10/2016 MV: EF 60%, no ischemia.   OSA on CPAP 05/05/2017   Unattended Home Sleep Test 7/2/813-AHI 38.6/hour, desaturation to 64%, body weight 261 pounds   PONV (postoperative nausea and vomiting)    Sleep apnea     uses cpap asked to bring mask and tubing   Past Surgical History:  Procedure Laterality Date   ABDOMINAL AORTOGRAM W/LOWER EXTREMITY N/A 12/10/2020   Procedure: ABDOMINAL AORTOGRAM  W/LOWER EXTREMITY;  Surgeon: Cherre Robins, MD;  Location: [facility];  Service: Cardiovascular;  Laterality: N/A;   AMPUTATION Right 01/22/2021   Procedure: 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;  Surgeon: [name], MD;  Location: [facility];  Service: Orthopedics;  Laterality: Right;   AMPUTATION Right 03/17/2021   Procedure: RIGHT BELOW KNEE AMPUTATION;  Surgeon: [name], MD;  Location: [facility];  Service: Orthopedics;  Laterality: Right;   ANKLE FUSION Right 01/22/2021   Procedure: RIGHT FOOT TIBIOCALCANEAL FUSION;  Surgeon: [name], MD;  Location: [facility];  Service: Orthopedics;  Laterality: Right;   ANTERIOR FUSION CERVICAL SPINE  2012   CARDIAC CATHETERIZATION  2011   CARDIAC CATHETERIZATION N/A 11/09/2016   Procedure: Right Heart Cath;  Surgeon: Belva Crome, MD;  Location: [facility];  Service: Cardiovascular;  Laterality: N/A;   carpel tunnel     left wrist   CATARACT EXTRACTION     CATARACT EXTRACTION W/ INTRAOCULAR LENS  IMPLANT, BILATERAL  2013   CERVICAL LAMINECTOMY  2012   COLONOSCOPY N/A 01/14/2013   Procedure: COLONOSCOPY;  Surgeon: Irene Shipper,  MD;  Location: [facility];  Service: Endoscopy;  Laterality: N/A;   CORONARY ARTERY BYPASS GRAFT  11/04/2020   LIMA-LAD, SVG-OM1, SVG-PDA (Dr [facility]) dc 11/18/2020   EYE SURGERY     I & D EXTREMITY Right 02/19/2021   Procedure: RIGHT ANKLE DEBRIDEMENT AND PLACEMENT ANTIBIOTIC BEADS;  Surgeon: [name], MD;  Location: [facility];  Service: Orthopedics;  Laterality: Right;   KNEE SURGERY  1998   left   LEFT HEART CATH AND CORONARY ANGIOGRAPHY N/A 07/10/2020   Procedure: LEFT HEART CATH AND CORONARY ANGIOGRAPHY;  Surgeon: Sherren Mocha, MD;  Location: [facility];  Service: Cardiovascular;  Laterality: N/A;   LUMBAR LAMINECTOMY  2003   LUNG BIOPSY Left 12/26/2016   Procedure: LUNG BIOPSY;  Surgeon: [name], MD;  Location: [place] [facility] [place];  Service: Thoracic;  Laterality: Left;   PACEMAKER IMPLANT  N/A 03/30/2020   Procedure: PACEMAKER IMPLANT;  Surgeon: Evans Lance, MD;  Location: [facility];  Service: Cardiovascular;  Laterality: N/A;   PERIPHERAL VASCULAR INTERVENTION Right 12/10/2020   Procedure: PERIPHERAL VASCULAR INTERVENTION;  Surgeon: Cherre Robins, MD;  Location: [facility];  Service: Cardiovascular;  Laterality: Right;  SFA   POSTERIOR FUSION CERVICAL SPINE  2012   PPM GENERATOR REMOVAL N/A 12/14/2020   Procedure: PPM GENERATOR REMOVAL;  Surgeon: Evans Lance, MD;  Location: [facility];  Service: Cardiovascular;  Laterality: N/A;   TEE WITHOUT CARDIOVERSION N/A 12/11/2020   Procedure: TRANSESOPHAGEAL ECHOCARDIOGRAM (TEE);  Surgeon: Geralynn Rile, MD;  Location: [facility];  Service: Cardiovascular;  Laterality: N/A;   TRIGGER FINGER RELEASE  2011   4th finger left hand   VIDEO ASSISTED THORACOSCOPY Left 12/26/2016   Procedure: VIDEO ASSISTED THORACOSCOPY;  Surgeon: [name], MD;  Location: [facility];  Service: Thoracic;  Laterality: Left;   VIDEO BRONCHOSCOPY N/A 12/26/2016   Procedure: VIDEO BRONCHOSCOPY;  Surgeon: [name], MD;  Location: [facility];  Service: Thoracic;  Laterality: N/A;   Patient Active Problem List   Diagnosis Date Noted   Intermediate stage nonexudative age-related macular degeneration of both eyes 04/25/2022   Research study patient 12/24/2021   Post-operative pain    CKD (chronic kidney disease), stage II    Labile blood glucose    Pulmonary fibrosis ([place])    Hyponatremia    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[place]) 03/26/2021   S/P BKA (below knee amputation) unilateral, right ([place])    Shortness of breath    Acute blood loss anemia    Stage 3 chronic kidney disease ([place])    Atrial fibrillation ([place])    Controlled type 2 diabetes mellitus with hyperglycemia, with long-term current use of insulin ([facility])    Postoperative pain    Postoperative hemorrhagic shock 03/20/2021   Atherosclerosis of native  arteries of extremities with gangrene, right leg ([place])    Hardware complicating wound infection ([place])    Abscess of bursa of right ankle 02/15/2021   Endocarditis 01/19/2021   Peripheral vascular disease ([place]) 12/29/2020   CHF (congestive heart failure), NYHA class II, chronic, diastolic ([place]) 48/88/9169   History of COVID-19 12/22/2020   SSS (sick sinus syndrome) ([place]) 12/22/2020   Acute bacterial endocarditis    MSSA bacteremia 12/09/2020   Diabetic foot ulcer ([place]) 12/09/2020   Foot drop, right foot    Generalized weakness 12/08/2020   Dehydration with hyponatremia 12/08/2020   Atrial fibrillation, chronic ([place]) 12/08/2020   Elevated troponin level not  due myocardial infarction 12/08/2020   Adrenal insufficiency ([place]) 11/14/2020   Orthostatic hypotension 11/14/2020   Physical deconditioning 11/14/2020   Difficulty sleeping 11/07/2020   Postoperative anemia due to acute blood loss 11/07/2020   Right ventricular dysfunction 11/05/2020   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/04/2020   Hearing loss 11/03/2020   Cardiac device in situ 09/09/2020   Coronary atherosclerosis due to lipid rich plaque 09/02/2020   COVID-19 virus infection 07/18/2020   Coronary artery disease involving native heart without angina pectoris 07/10/2020   Chronic kidney disease, stage 3a ([place]) 03/29/2020   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/28/2020   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 ([place]) 03/16/2020   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 ([place]) 03/16/2020   Right epiretinal membrane 03/16/2020   Posterior vitreous detachment of right eye 03/16/2020   Aortic stenosis, moderate 03/12/2020   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   Near syncope 04/27/2018   Hypoglycemia due to insulin 01/06/2018   Essential hypertension 01/06/2018   Diabetes mellitus type 2, with complication, on long term insulin pump ([place])  01/06/2018   Syncope and collapse 01/05/2018   Encounter for therapeutic drug monitoring 06/29/2017   OSA on CPAP 05/05/2017   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[place]) 01/05/2017   Abnormal chest x-ray 10/12/2016   Benign neoplasm of colon 01/14/2013    REFERRING DIAG: Z89.511 (ICD-10-CM) - Hx of right BKA   ONSET DATE: 05/04/2022 MD visit with release to [facility] on LLE  THERAPY DIAG:  Unsteadiness on feet  Other abnormalities of gait and mobility  Muscle weakness (generalized)  Abnormal posture  Rationale for Evaluation and Treatment Rehabilitation  PERTINENT HISTORY: CKD st III, AV block Mobitz, pacemaker, pulmonary fibrosis, CAD, CABG 21, HTN, A-Fib, DM2, PAD, right BKA, Left TMA  PRECAUTIONS: ICD/Pacemaker - continue monitoring vitals with activity and calculated HRmax is 151 bpm  SUBJECTIVE:  The velcro on AFO does not hold strap in place.  PAIN:    Are you having pain? Yes: NPRS scale: today  0/10, over last week lowest 0/10  Pain location: left shoulder lateral just distal to joint Pain description: tingling when shoulder in improper posture. Aggravating factors: improper posture. Relieving factors: correcting posture   OBJECTIVE: (objective measures completed at initial evaluation unless otherwise dated)  COGNITION: Overall cognitive status: Within functional limits for tasks assessed   POSTURE: 05/09/2022:  rounded shoulders, forward head, and flexed trunk    LOWER EXTREMITY ROM:    UPPER EXTREMITY ROM:  ROM P:passive  A:active Left 06/23/22 Supine all limited by pain Left 08/01/22 Seated All painfree Right 08/01/22 Seated  Shoulder flexion 30* A: 148 A: 153  Shoulder extension 0* neutral A: 72 A: 74  Shoulder abduction 45* A: 135 A: 141  Shoulder adduction     Shoulder internal rotation 20* A: 84 A: 89  Shoulder external rotation 10* A: 88 A: 83  Elbow flexion     Elbow extension -15*     (Blank rows = not tested)    ROM P:passive  A:active  Right Eval 05/09/2022 Left Eval 05/09/2022 Bilateral 08/03/22  Hip flexion       Hip extension A: -25* standing A: -25* standing A: -15*  RW support standing  Hip abduction       Hip adduction       Hip internal rotation       Hip external rotation  Knee flexion       Knee extension       Ankle dorsiflexion NA P: -17*   Ankle plantarflexion NA     Ankle inversion NA     Ankle eversion NA      (Blank rows = not tested)   LOWER EXTREMITY MMT:   MMT Right Eval 05/09/2022 Left Eval 05/09/2022  Hip flexion      Hip extension Functional testing 3-/5 Functional testing 3-/5  Hip abduction Functional testing 3-/5 Functional testing 3-/5  Hip adduction      Hip internal rotation      Hip external rotation      Knee flexion Functional testing 3-/5 Functional testing 3-/5  Knee extension Functional testing 3/5 Functional testing 3/5  Ankle dorsiflexion      Ankle plantarflexion      Ankle inversion      Ankle eversion      (Blank rows = not tested)   POSTURE:  06/23/2022  seated - head forward, rounded shoulders and posterior pelvic tilt  TRANSFERS: 05/09/2022:  Sit to stand: SBA and requires use of armrests from 20" stable w/c & needs RW for stabilization 05/09/2022:  Stand to sit: SBA and requires use of armrests from 20" stable w/c & needs RW for stabilization   GAIT: 08/11/2022: Pt amb 100' with rollator, right TTA prosthesis & left AFO partial shoe filler with SBA. He demonstrates increased step length bil., intermittent impaired foot clearance on RLE  08/03/2022: 2-minute Walk Test with RW, right TTA prosthesis & left AFO partial shoe filler. Resting pre HR 70 SpO2 100% HR after activity HR 52  SpO2 100% light amount of light headness Distance 158' with supervision.   Gait pattern: step-to pattern, but distance improved since evaluation, Right hip hike, trunk flexed, and normal 4" BOS  Pt neg ramp & curb with RW, ight TTA prosthesis & left AFO partial shoe filler with  supervision. He required seated rest half way due to fatigue.    05/09/2022:  Gait pattern: step to pattern, decreased step length- Left, decreased stance time- Right, Right hip hike, antalgic, lateral hip instability, trunk flexed, and wide BOS Distance walked: 25' Assistive device utilized: Environmental consultant - 2 wheeled and TTA prosthesis RLE Level of assistance: Min A Comments: pt reports dizziness with turning in gait   UE FUNCTION: 06/23/2022:  Pt reports that he uses RUE for fine motor like writing but uses both hands for gross motor or sports.  Seated - pt unable to raise LUE in flexion or abd due to pain.  Supine - pt unable to move LUE in flexion or abd due to pain.  PROM flexion > pain than abd   Pt tolerates elbow ext with palm supinated but with palm neutral causes shoulder pain. Also painful with elbow already extended and pronate palm.   PT simulated pushing on RW thru LUE with manual resistance.  Pt reports slight increase in pain but not as sharp as above function.    FUNCTIONAL TESTs:  Merrilee Jansky Balance Scale: 08/03/2022:  13/56 & tasks of Berg with RW support 45/56   Berg Balance Scale: 05/09/2022:  9/56 & tasks of Berg with RW support 27/56    CURRENT PROSTHETIC WEAR ASSESSMENT: 08/03/2022: Pt wears both right TTA prosthesis & left AFO partial shoe filler>90% of awake hours.  He has 2 wounds on dorsum of left foot being monitored by PCP.  He is wearing left Vive Wear compression sock below knee on LLE which  has helped wounds to heal. Pt is independent in donning & doffing both prosthesis & AFO. Skin check, cleaning and minor cues on adjusting ply socks.   05/09/2022:   Patient is dependent with: skin check, residual limb care, care of non-amputated limb, prosthetic cleaning, ply sock cleaning, and correct ply sock adjustment Donning prosthesis: Modified independence Doffing prosthesis: Modified independence Prosthetic wear tolerance: >90% of awake hours/day, 7 days/week Prosthetic weight  bearing tolerance: 5 minutes Edema: LLE pitting edema with >10 sec capillary refill Residual limb condition: Right BKA limb has no openings, normal temperature & moisture.  Redness patella & patella tendon from friction of liner with knee flexed. Cylinderical shape.  LLE TMA pt's wife showed picture of wound on dorsum of foot that is being managed by PCP.   Prosthetic description: silicon liner with pin lock suspension, total contact socket, dynamic response foot.      TODAY'S TREATMENT:  10/12/2022 Neuromuscular Re-ed: Working on step strategy ant, post & lateral right /left with RW support standing crossways on beam - anticipatory stepping off alternating LEs to catch self, progressed to reactionary with PT removing trunk resistance. Standing crossways on foam beam with single UE support on RW using mirror for upright posture alternating LEs 5 reps ea - forward kicks, abduction, extension & adduction both ant/post to stance LE. Standing crossways on foam beam with light BUE support on RW static eyes closed 10 sec   PT demo & verbal cues how to work on balance as ongoing HEP. HO in patient instructions Corner with walker in front of you: Alternate legs kicking / moving leg a.forward b.out to side c.backwards d. Across in front  e.across in back Stand with eyes closed 10 seconds Stand on 2" X 6"  step off a. Forward b. Backward into corner c. To right into corner  d.to left into corner   Walker step up & down on 4" block. Lead with both legs.    Walker support reaching to floor: reach one hand at time to cross bar then to floor. Alternate lead arms.        Pt & wife verbalized understanding.   Pt continues to need short frequent rests between activities due to [facility]  10/10/2022 Neuromuscular Re-ed: Working on step strategy ant, post & lateral right /left with RW support standing crossways on beam - anticipatory stepping off alternating LEs to catch self, progressed to reactionary with PT  removing trunk resistance. Standing crossways on foam beam with single UE support on RW using mirror for upright posture alternating LEs 5 reps ea - forward kicks, abduction, extension & adduction both ant/post to stance LE.  Therapeutic Exercise: PT reviewed need for ongoing exercise program post discharge to include flexibility, strength, balance & endurance.  PT gave recommendations for incorporating into his routine. Pt & wife verbalized understanding. Step up & step down on 4" block with BUE support leading with ea LE 5 reps. PT cued on how to incorporate into his HEP. Pt & wife verbalized understanding.   Pt continues to need short frequent rests between activities due to [facility]  10/05/2022 Neuromuscular Re-ed: Working on step strategy ant, post & lateral right /left : //bar support light standing crossways on beam - anticipatory stepping off alternating LEs to catch self, progressed to reactionary with PT removing trunk resistance.  -progressed to RW support standing crossways on beam - anticipatory stepping off alternating LEs to catch self, progressed to reactionary with PT removing trunk resistance. Standing crossways on foam  beam with single UE support on RW using mirror for upright posture alternating LEs 5 reps ea - forward kicks, abduction, extension & adduction both ant/post to stance LE.  Pt continues to need short frequent rests between activities due to [place]: [email or web address] URL: [email or web address] Date: 07/05/2022 Prepared by: Jamey Reas  Exercises - Seated Correct Posture  - 1 x daily - 7 x weekly - Seated Scapular Retraction  - 2-5 x daily - 7 x weekly - 1 sets - 10 reps - 5 seconds hold - Supine Chin Tucks on Flat Ball  - 2-5 x daily - 7 x weekly - 1 sets - 10 reps - 5 seconds hold - Seated Isometric Cervical Retraction with Chin Tucks with Pillow behind head  - 2-5 x daily - 7 x weekly - 1 sets - 10 reps - 5 seconds  hold - Circular Shoulder Pendulum with Table Support  - 1-2 x daily - 7 x weekly - 1-2 sets - 10 reps - 5 seconds hold - Flexion-Extension Shoulder Pendulum with Table Support  - 1-2 x daily - 7 x weekly - 1-2 sets - 10 reps - Horizontal Shoulder Pendulum with Table Support  - 1-2 x daily - 7 x weekly - 1-2 sets - 10 reps - Supine Shoulder Flexion Extension AAROM with Dowel  - 1 x daily - 7 x weekly - 1-2 sets - 10 reps - 5 seconds hold - Supine Shoulder Abduction AAROM with Dowel  - 1 x daily - 7 x weekly - 1-2 sets - 10 reps - 5 seconds hold - Supine Shoulder External Rotation with Dowel  - 1 x daily - 7 x weekly - 1-2 sets - 10 reps - 5 seconds hold   Neuromuscular Reed-  cues on proprioception RLE using residual limb, balance & standing tolerance.   Do each exercise 1-2  times per day Do each exercise 5-10 repetitions Hold each exercise for 2 seconds to feel your location  AT [place].  Try to find this position when standing still for activities.   USE TAPE ON FLOOR TO MARK THE MIDLINE POSITION which is even with middle of sink.  You also should try to feel with your limb pressure in socket.  You are trying to feel with limb what you used to feel with the bottom of your foot.  Side to Side Shift: Moving your hips only (not shoulders): move weight onto your left leg, HOLD/FEEL pressure in socket.  Move back to equal weight on each leg, HOLD/FEEL pressure in socket. Move weight onto your right leg, HOLD/FEEL pressure in socket. Move back to equal weight on each leg, HOLD/FEEL pressure in socket. Repeat.  Start with both hands on sink, progress to hand on prosthetic side only, then no hands.  Front to Back Shift: Moving your hips only (not shoulders): move your weight forward onto your toes, HOLD/FEEL pressure in socket. Move your weight back to equal Flat Foot on both legs, HOLD/FEEL  pressure in socket. Move your weight  back onto your heels, HOLD/FEEL  pressure in socket. Move your weight back to equal on both legs, HOLD/FEEL  pressure in socket. Repeat.  Start with both hands on sink, progress to hand on prosthetic side only, then no hands.  Moving Cones / Cups: With equal weight on each leg: Hold on with one hand the first time, then progress to  no hand supports. Move cups from one side of sink to the other. Place cups ~2" out of your reach, progress to 10" beyond reach.  Place one hand in middle of sink and reach with other hand. Do both arms.  Then hover one hand and move cups with other hand.  Overhead/Upward Reaching: alternated reaching up to top cabinets or ceiling if no cabinets present. Keep equal weight on each leg. Start with one hand support on counter while other hand reaches and progress to no hand support with reaching.  ace one hand in middle of sink and reach with other hand. Do both arms.  Then hover one hand and move cups with other hand.  5.   Looking Over Shoulders: With equal weight on each leg: alternate turning to look over your shoulders with one hand support on counter as needed.  Start with head motions only to look in front of shoulder, then even with shoulder and progress to looking behind you. To look to side, move head /eyes, then shoulder on side looking pulls back, shift more weight to side looking and pull hip back. Place one hand in middle of sink and let go with other hand so your shoulder can pull back. Switch hands to look other way.   Then hover one hand and look over shoulder. If looking right, use left hand at sink. If looking left, use right hand at sink. 6.  Alternate Tapping Toes or Stepping to lower shelf of cabinet  Move items under cabinet out of your way. Shift your hips/pelvis so weight on stance leg & Tighten muscles in hip.  SLOWLY step other leg so front of foot is in cabinet. Then step back to floor. Repeat with other leg.     ASSESSMENT: CLINICAL IMPRESSION: Patient's  step strategy to prevent fall seems to be improving.  Pt & wife verbalize how to create safe environment to continue ongoing work to maintain ability.  Pt continues to benefit from skilled PT to maximize function & safety.    OBJECTIVE IMPAIRMENTS .Abnormal gait, cardiopulmonary status limiting activity, decreased activity tolerance, decreased balance, decreased endurance, decreased knowledge of use of DME, decreased mobility, decreased ROM, decreased strength, dizziness, increased edema, impaired flexibility, postural dysfunction, prosthetic dependency , obesity, and pain.    ACTIVITY LIMITATIONS carrying, lifting, bending, standing, stairs, transfers, locomotion level, UE use with ADLs, sleep    PARTICIPATION LIMITATIONS: driving, community activity, and yard work, UE use with ADLs &  household activities   [place], Time since onset of injury/illness/exacerbation, and 3+ comorbidities: see PMH  are also affecting patient's functional outcome.    REHAB POTENTIAL: Good   CLINICAL DECISION MAKING: Evolving/moderate complexity   EVALUATION COMPLEXITY: Moderate     GOALS: Goals reviewed with patient? Yes  SHORT TERM GOALS: Target date: 09/29/2022  1. Patient able to pick up items from floor with single UE support on walker or surface. Baseline: SEE OBJECTIVE DATA Goal status: met 09/26/2022  2. Patient reports ambulating in home & entering/exiting home >90% of time over w/c use.  Baseline: SEE OBJECTIVE DATA Goal status: partially met 09/28/2022  3. Patient ambulates up to 200' with RW, prosthesis & AFO with supervision.  Goal status: partially met to level of supervision but not distance 09/28/2022     UPDATED LONG TERM GOALS: Target date: 10/26/2022   Patient demonstrates & verbalized understanding of prosthetic care to enable safe utilization of prosthesis. Baseline: SEE OBJECTIVE DATA Goal status: ongoing 08/03/22  Patient tolerates right prosthesis & left  AFO / partial foot prosthesis wear >90% of awake hours without skin or limb pain issues. Baseline: SEE OBJECTIVE DATA Goal status: ongoing 08/03/22    Berg Balance >/= 20/56 and tasks of Berg with RW support >/= 52/56 to indicate lower fall risk with standing ADLs Baseline: SEE OBJECTIVE DATA Goal status: 08/03/2022 updated    Patient ambulates >300' with prosthesis & LRAD modified independent Baseline: SEE OBJECTIVE DATA Goal status: ongoing 08/03/2022   Patient negotiates ramps, curbs & stairs with single rail with prosthesis & LRAD modified independent. Baseline: SEE OBJECTIVE DATA Goal status: ongoing 08/03/22  PLAN: PT FREQUENCY: 2x/week   PT DURATION: 12 weeks  PLANNED INTERVENTIONS: Therapeutic exercises, Therapeutic activity, Neuromuscular re-education, Balance training, Gait training, Patient/Family education, Self Care, Stair training, Vestibular training, Canalith repositioning, Prosthetic training, DME instructions, Re-evaluation, and physical performance testing    PLAN FOR NEXT SESSION:     continue work towards Cassia, continue to instruct in ongoing HEP / functional activities.    Jamey Reas, PT, DPT 10/12/2022, 8:47 AM

## 2022-10-12 NOTE — Patient Instructions (Signed)
Corner with walker in front of you: Alternate legs kicking / moving leg a.forward b.out to side c.backwards d. Across in front  e.across in back Stand with eyes closed 10 seconds Stand on 2" X 6"  step off a. Forward b. Backward into corner c. To right into corner  d.to left into corner  Walker step up & down on 4" block. Lead with both legs.   Walker support reaching to floor: reach one hand at time to cross bar then to floor. Alternate lead arms.

## 2022-10-17 ENCOUNTER — Ambulatory Visit (INDEPENDENT_AMBULATORY_CARE_PROVIDER_SITE_OTHER): Payer: Medicare Other | Admitting: Physical Therapy

## 2022-10-17 ENCOUNTER — Encounter: Payer: Self-pay | Admitting: Physical Therapy

## 2022-10-17 DIAGNOSIS — M6281 Muscle weakness (generalized): Secondary | ICD-10-CM | POA: Diagnosis not present

## 2022-10-17 DIAGNOSIS — R293 Abnormal posture: Secondary | ICD-10-CM | POA: Diagnosis not present

## 2022-10-17 DIAGNOSIS — R2681 Unsteadiness on feet: Secondary | ICD-10-CM

## 2022-10-17 DIAGNOSIS — R2689 Other abnormalities of gait and mobility: Secondary | ICD-10-CM | POA: Diagnosis not present

## 2022-10-17 NOTE — Therapy (Signed)
OUTPATIENT PHYSICAL THERAPY TREATMENT NOTE   Patient Name: Shane [name] MRN: [id number] DOB:02/12/1941, 81 y.o., male Today's Date: 10/17/2022  PCP: Burnard Bunting, MD REFERRING PROVIDER: [name], MD    END OF SESSION:   PT End of Session - 10/17/22 0759     Visit Number 44    Number of Visits 47    Date for PT Re-Evaluation 10/26/22    Authorization Type UHC Medicare    Authorization Time Period UHC MEDICARE  $20 COPAY    PT Start Time 0800    PT Stop Time 0842    PT Time Calculation (min) 42 min    Equipment Utilized During Treatment Gait belt    Activity Tolerance Patient tolerated treatment well;Patient limited by fatigue    Behavior During Therapy WFL for tasks assessed/performed                          Past Medical History:  Diagnosis Date   Anxiety    AV block, Mobitz 1    Cataract    Chronic kidney disease    d/t DM   CKD (chronic kidney disease), stage III ([place])    Depression    Diabetes mellitus    Vgo disposal insulin bolus  simular to insulin pump   Dyspnea    GERD (gastroesophageal reflux disease)    History of kidney stones    passed   Hyperlipidemia    Hypertension    Idiopathic pulmonary fibrosis ([place]) 11/2016   ILD (interstitial lung disease) ([place])    Moderate aortic stenosis    a. 10/2019 Echo: EF 55-60%, Gr2 DD. Nl RV.    Neuromuscular disorder ([place])    Neuropathy associated with endocrine disorder ([place])    Nonobstructive CAD (coronary artery disease)    a. 2012 Cath: mod, nonobs dzs; b. 10/2016 MV: EF 60%, no ischemia.   OSA on CPAP 05/05/2017   Unattended Home Sleep Test 7/2/813-AHI 38.6/hour, desaturation to 64%, body weight 261 pounds   PONV (postoperative nausea and vomiting)    Sleep apnea     uses cpap asked to bring mask and tubing   Past Surgical History:  Procedure Laterality Date   ABDOMINAL AORTOGRAM W/LOWER EXTREMITY N/A 12/10/2020   Procedure: ABDOMINAL AORTOGRAM W/LOWER EXTREMITY;  Surgeon:  Cherre Robins, MD;  Location: [facility];  Service: Cardiovascular;  Laterality: N/A;   AMPUTATION Right 01/22/2021   Procedure: 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;  Surgeon: [name], MD;  Location: [place];  Service: Orthopedics;  Laterality: Right;   AMPUTATION Right 03/17/2021   Procedure: RIGHT BELOW KNEE AMPUTATION;  Surgeon: [name], MD;  Location: [place];  Service: Orthopedics;  Laterality: Right;   ANKLE FUSION Right 01/22/2021   Procedure: RIGHT FOOT TIBIOCALCANEAL FUSION;  Surgeon: [name], MD;  Location: [place];  Service: Orthopedics;  Laterality: Right;   ANTERIOR FUSION CERVICAL SPINE  2012   CARDIAC CATHETERIZATION  2011   CARDIAC CATHETERIZATION N/A 11/09/2016   Procedure: Right Heart Cath;  Surgeon: Belva Crome, MD;  Location: [place] [facility];  Service: Cardiovascular;  Laterality: N/A;   carpel tunnel     left wrist   CATARACT EXTRACTION     CATARACT EXTRACTION W/ INTRAOCULAR LENS  IMPLANT, BILATERAL  2013   CERVICAL LAMINECTOMY  2012   COLONOSCOPY N/A 01/14/2013   Procedure: COLONOSCOPY;  Surgeon: Irene Shipper, MD;  Location: [facility];  Service: Endoscopy;  Laterality: N/A;   CORONARY ARTERY BYPASS GRAFT  11/04/2020   LIMA-LAD, SVG-OM1, SVG-PDA (Dr Marney [facility]) dc 11/18/2020   EYE SURGERY     I & D EXTREMITY Right 02/19/2021   Procedure: RIGHT ANKLE DEBRIDEMENT AND PLACEMENT ANTIBIOTIC BEADS;  Surgeon: [name], MD;  Location: [place];  Service: Orthopedics;  Laterality: Right;   KNEE SURGERY  1998   left   LEFT HEART CATH AND CORONARY ANGIOGRAPHY N/A 07/10/2020   Procedure: LEFT HEART CATH AND CORONARY ANGIOGRAPHY;  Surgeon: Sherren Mocha, MD;  Location: [place] [facility];  Service: Cardiovascular;  Laterality: N/A;   LUMBAR LAMINECTOMY  2003   LUNG BIOPSY Left 12/26/2016   Procedure: LUNG BIOPSY;  Surgeon: [name], MD;  Location: [place];  Service: Thoracic;  Laterality: Left;   PACEMAKER IMPLANT N/A 03/30/2020   Procedure:  PACEMAKER IMPLANT;  Surgeon: Evans Lance, MD;  Location: [place] [facility];  Service: Cardiovascular;  Laterality: N/A;   PERIPHERAL VASCULAR INTERVENTION Right 12/10/2020   Procedure: PERIPHERAL VASCULAR INTERVENTION;  Surgeon: Cherre Robins, MD;  Location: [place] [facility];  Service: Cardiovascular;  Laterality: Right;  SFA   POSTERIOR FUSION CERVICAL SPINE  2012   PPM GENERATOR REMOVAL N/A 12/14/2020   Procedure: PPM GENERATOR REMOVAL;  Surgeon: Evans Lance, MD;  Location: [place] [facility];  Service: Cardiovascular;  Laterality: N/A;   TEE WITHOUT CARDIOVERSION N/A 12/11/2020   Procedure: TRANSESOPHAGEAL ECHOCARDIOGRAM (TEE);  Surgeon: Geralynn Rile, MD;  Location: [place];  Service: Cardiovascular;  Laterality: N/A;   TRIGGER FINGER RELEASE  2011   4th finger left hand   VIDEO ASSISTED THORACOSCOPY Left 12/26/2016   Procedure: VIDEO ASSISTED THORACOSCOPY;  Surgeon: [name], MD;  Location: [place];  Service: Thoracic;  Laterality: Left;   VIDEO BRONCHOSCOPY N/A 12/26/2016   Procedure: VIDEO BRONCHOSCOPY;  Surgeon: [name], MD;  Location: [place];  Service: Thoracic;  Laterality: N/A;   Patient Active Problem List   Diagnosis Date Noted   Intermediate stage nonexudative age-related macular degeneration of both eyes 04/25/2022   Research study patient 12/24/2021   Post-operative pain    CKD (chronic kidney disease), stage II    Labile blood glucose    Pulmonary fibrosis ([place])    Hyponatremia    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[place]) 03/26/2021   S/P BKA (below knee amputation) unilateral, right ([place])    Shortness of breath    Acute blood loss anemia    Stage 3 chronic kidney disease ([place])    Atrial fibrillation ([place])    Controlled type 2 diabetes mellitus with hyperglycemia, with long-term current use of insulin (H[facility])    Postoperative pain    Postoperative hemorrhagic shock 03/20/2021   Atherosclerosis of native arteries of extremities with  gangrene, right leg ([place])    Hardware complicating wound infection ([place])    Abscess of bursa of right ankle 02/15/2021   Endocarditis 01/19/2021   Peripheral vascular disease ([place]) 12/29/2020   CHF (congestive heart failure), NYHA class II, chronic, diastolic ([place]) 41/74/0814   History of COVID-19 12/22/2020   SSS (sick sinus syndrome) ([place]) 12/22/2020   Acute bacterial endocarditis    MSSA bacteremia 12/09/2020   Diabetic foot ulcer ([place]) 12/09/2020   Foot drop, right foot    Generalized weakness 12/08/2020   Dehydration with hyponatremia 12/08/2020   Atrial fibrillation, chronic (HCC) 12/08/2020   Elevated troponin level not due myocardial infarction 12/08/2020   Adrenal insufficiency ([place])  11/14/2020   Orthostatic hypotension 11/14/2020   Physical deconditioning 11/14/2020   Difficulty sleeping 11/07/2020   Postoperative anemia due to acute blood loss 11/07/2020   Right ventricular dysfunction 11/05/2020   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/04/2020   Hearing loss 11/03/2020   Cardiac device in situ 09/09/2020   Coronary atherosclerosis due to lipid rich plaque 09/02/2020   COVID-19 virus infection 07/18/2020   Coronary artery disease involving native heart without angina pectoris 07/10/2020   Chronic kidney disease, stage 3a ([place]) 03/29/2020   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/28/2020   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 ([place]) 03/16/2020   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 ([place]) 03/16/2020   Right epiretinal membrane 03/16/2020   Posterior vitreous detachment of right eye 03/16/2020   Aortic stenosis, moderate 03/12/2020   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   Near syncope 04/27/2018   Hypoglycemia due to insulin 01/06/2018   Essential hypertension 01/06/2018   Diabetes mellitus type 2, with complication, on long term insulin pump ([place]) 01/06/2018   Syncope and  collapse 01/05/2018   Encounter for therapeutic drug monitoring 06/29/2017   OSA on CPAP 05/05/2017   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[place]) 01/05/2017   Abnormal chest x-ray 10/12/2016   Benign neoplasm of colon 01/14/2013    REFERRING DIAG: Z89.511 (ICD-10-CM) - Hx of right BKA   ONSET DATE: 05/04/2022 MD visit with release to [facility] on LLE  THERAPY DIAG:  Unsteadiness on feet  Other abnormalities of gait and mobility  Muscle weakness (generalized)  Abnormal posture  Rationale for Evaluation and Treatment Rehabilitation  PERTINENT HISTORY: CKD st III, AV block Mobitz, pacemaker, pulmonary fibrosis, CAD, CABG 21, HTN, A-Fib, DM2, PAD, right BKA, Left TMA  PRECAUTIONS: ICD/Pacemaker - continue monitoring vitals with activity and calculated HRmax is 151 bpm  SUBJECTIVE:  He has been working on his old car including getting off stool.  He has been working step strategy on 2" X 6"  PAIN:    Are you having pain? Yes: NPRS scale: today  0/10, over last week lowest 0/10  Pain location: left shoulder lateral just distal to joint Pain description: tingling when shoulder in improper posture. Aggravating factors: improper posture. Relieving factors: correcting posture   OBJECTIVE: (objective measures completed at initial evaluation unless otherwise dated)  COGNITION: Overall cognitive status: Within functional limits for tasks assessed   POSTURE: 05/09/2022:  rounded shoulders, forward head, and flexed trunk    LOWER EXTREMITY ROM:    UPPER EXTREMITY ROM:  ROM P:passive  A:active Left 06/23/22 Supine all limited by pain Left 08/01/22 Seated All painfree Right 08/01/22 Seated  Shoulder flexion 30* A: 148 A: 153  Shoulder extension 0* neutral A: 72 A: 74  Shoulder abduction 45* A: 135 A: 141  Shoulder adduction     Shoulder internal rotation 20* A: 84 A: 89  Shoulder external rotation 10* A: 88 A: 83  Elbow flexion     Elbow extension -15*     (Blank rows = not tested)     ROM P:passive  A:active Right Eval 05/09/2022 Left Eval 05/09/2022 Bilateral 08/03/22  Hip flexion       Hip extension A: -25* standing A: -25* standing A: -15*  RW support standing  Hip abduction       Hip adduction       Hip internal rotation       Hip external rotation  Knee flexion       Knee extension       Ankle dorsiflexion NA P: -17*   Ankle plantarflexion NA     Ankle inversion NA     Ankle eversion NA      (Blank rows = not tested)   LOWER EXTREMITY MMT:   MMT Right Eval 05/09/2022 Left Eval 05/09/2022  Hip flexion      Hip extension Functional testing 3-/5 Functional testing 3-/5  Hip abduction Functional testing 3-/5 Functional testing 3-/5  Hip adduction      Hip internal rotation      Hip external rotation      Knee flexion Functional testing 3-/5 Functional testing 3-/5  Knee extension Functional testing 3/5 Functional testing 3/5  Ankle dorsiflexion      Ankle plantarflexion      Ankle inversion      Ankle eversion      (Blank rows = not tested)   POSTURE:  06/23/2022  seated - head forward, rounded shoulders and posterior pelvic tilt  TRANSFERS: 05/09/2022:  Sit to stand: SBA and requires use of armrests from 20" stable w/c & needs RW for stabilization 05/09/2022:  Stand to sit: SBA and requires use of armrests from 20" stable w/c & needs RW for stabilization   GAIT: 08/11/2022: Pt amb 100' with rollator, right TTA prosthesis & left AFO partial shoe filler with SBA. He demonstrates increased step length bil., intermittent impaired foot clearance on RLE  08/03/2022: 2-minute Walk Test with RW, right TTA prosthesis & left AFO partial shoe filler. Resting pre HR 70 SpO2 100% HR after activity HR 52  SpO2 100% light amount of light headness Distance 158' with supervision.   Gait pattern: step-to pattern, but distance improved since evaluation, Right hip hike, trunk flexed, and normal 4" BOS  Pt neg ramp & curb with RW, ight TTA prosthesis & left AFO  partial shoe filler with supervision. He required seated rest half way due to fatigue.    05/09/2022:  Gait pattern: step to pattern, decreased step length- Left, decreased stance time- Right, Right hip hike, antalgic, lateral hip instability, trunk flexed, and wide BOS Distance walked: 25' Assistive device utilized: Environmental consultant - 2 wheeled and TTA prosthesis RLE Level of assistance: Min A Comments: pt reports dizziness with turning in gait   UE FUNCTION: 06/23/2022:  Pt reports that he uses RUE for fine motor like writing but uses both hands for gross motor or sports.  Seated - pt unable to raise LUE in flexion or abd due to pain.  Supine - pt unable to move LUE in flexion or abd due to pain.  PROM flexion > pain than abd   Pt tolerates elbow ext with palm supinated but with palm neutral causes shoulder pain. Also painful with elbow already extended and pronate palm.   PT simulated pushing on RW thru LUE with manual resistance.  Pt reports slight increase in pain but not as sharp as above function.    FUNCTIONAL TESTs:  Merrilee Jansky Balance Scale: 08/03/2022:  13/56 & tasks of Berg with RW support 45/56   Berg Balance Scale: 05/09/2022:  9/56 & tasks of Berg with RW support 27/56    CURRENT PROSTHETIC WEAR ASSESSMENT: 08/03/2022: Pt wears both right TTA prosthesis & left AFO partial shoe filler>90% of awake hours.  He has 2 wounds on dorsum of left foot being monitored by PCP.  He is wearing left Vive Wear compression sock below knee on LLE which  has helped wounds to heal. Pt is independent in donning & doffing both prosthesis & AFO. Skin check, cleaning and minor cues on adjusting ply socks.   05/09/2022:   Patient is dependent with: skin check, residual limb care, care of non-amputated limb, prosthetic cleaning, ply sock cleaning, and correct ply sock adjustment Donning prosthesis: Modified independence Doffing prosthesis: Modified independence Prosthetic wear tolerance: >90% of awake hours/day, 7  days/week Prosthetic weight bearing tolerance: 5 minutes Edema: LLE pitting edema with >10 sec capillary refill Residual limb condition: Right BKA limb has no openings, normal temperature & moisture.  Redness patella & patella tendon from friction of liner with knee flexed. Cylinderical shape.  LLE TMA pt's wife showed picture of wound on dorsum of foot that is being managed by PCP.   Prosthetic description: silicon liner with pin lock suspension, total contact socket, dynamic response foot.      TODAY'S TREATMENT:  10/17/2022 Therapeutic Exercise: Step up, over & down 4" block BUE support leading ea LE 5 reps & 6" 2 reps ea LE with noted less control but safe. Hip alternating kicks standing with BUE support green theraband 5 reps ea flex, abd & ext with knee extended. Pt verbalizes understanding as HEP.  Neuromuscular Re-ed: Working on step strategy ant, post & lateral right /left with RW support - anticipatory stepping off 4" step alternating LEs to catch self, progressed to reactionary with PT removing trunk resistance. Standing crossways on foam beam with light BUE support on RW head movements 4 directions &  static eyes closed 10 sec.  PT educated pt & wife on benefits to add to ongoing HEP to keep threshold for vestibular system higher & minimize dizziness. Pt & wife verbalized understanding.    10/12/2022 Neuromuscular Re-ed: Working on step strategy ant, post & lateral right /left with RW support standing crossways on beam - anticipatory stepping off alternating LEs to catch self, progressed to reactionary with PT removing trunk resistance. Standing crossways on foam beam with single UE support on RW using mirror for upright posture alternating LEs 5 reps ea - forward kicks, abduction, extension & adduction both ant/post to stance LE. Standing crossways on foam beam with light BUE support on RW static eyes closed 10 sec   PT demo & verbal cues how to work on balance as ongoing HEP. HO  in patient instructions Corner with walker in front of you: Alternate legs kicking / moving leg a.forward b.out to side c.backwards d. Across in front  e.across in back Stand with eyes closed 10 seconds Stand on 2" X 6"  step off a. Forward b. Backward into corner c. To right into corner  d.to left into corner   Walker step up & down on 4" block. Lead with both legs.    Walker support reaching to floor: reach one hand at time to cross bar then to floor. Alternate lead arms.        Pt & wife verbalized understanding.   Pt continues to need short frequent rests between activities due to [facility]  10/10/2022 Neuromuscular Re-ed: Working on step strategy ant, post & lateral right /left with RW support standing crossways on beam - anticipatory stepping off alternating LEs to catch self, progressed to reactionary with PT removing trunk resistance. Standing crossways on foam beam with single UE support on RW using mirror for upright posture alternating LEs 5 reps ea - forward kicks, abduction, extension & adduction both ant/post to stance LE.  Therapeutic Exercise: PT reviewed need  for ongoing exercise program post discharge to include flexibility, strength, balance & endurance.  PT gave recommendations for incorporating into his routine. Pt & wife verbalized understanding. Step up & step down on 4" block with BUE support leading with ea LE 5 reps. PT cued on how to incorporate into his HEP. Pt & wife verbalized understanding.   Pt continues to need short frequent rests between activities due to [place]: [email or web address] URL: [email or web address] Date: 07/05/2022 Prepared by: Jamey Reas  Exercises - Seated Correct Posture  - 1 x daily - 7 x weekly - Seated Scapular Retraction  - 2-5 x daily - 7 x weekly - 1 sets - 10 reps - 5 seconds hold - Supine Chin Tucks on Flat Ball  - 2-5 x daily - 7 x weekly - 1 sets - 10 reps - 5 seconds hold - Seated  Isometric Cervical Retraction with Chin Tucks with Pillow behind head  - 2-5 x daily - 7 x weekly - 1 sets - 10 reps - 5 seconds hold - Circular Shoulder Pendulum with Table Support  - 1-2 x daily - 7 x weekly - 1-2 sets - 10 reps - 5 seconds hold - Flexion-Extension Shoulder Pendulum with Table Support  - 1-2 x daily - 7 x weekly - 1-2 sets - 10 reps - Horizontal Shoulder Pendulum with Table Support  - 1-2 x daily - 7 x weekly - 1-2 sets - 10 reps - Supine Shoulder Flexion Extension AAROM with Dowel  - 1 x daily - 7 x weekly - 1-2 sets - 10 reps - 5 seconds hold - Supine Shoulder Abduction AAROM with Dowel  - 1 x daily - 7 x weekly - 1-2 sets - 10 reps - 5 seconds hold - Supine Shoulder External Rotation with Dowel  - 1 x daily - 7 x weekly - 1-2 sets - 10 reps - 5 seconds hold   Neuromuscular Reed-  cues on proprioception RLE using residual limb, balance & standing tolerance.   Do each exercise 1-2  times per day Do each exercise 5-10 repetitions Hold each exercise for 2 seconds to feel your location  AT [place].  Try to find this position when standing still for activities.   USE TAPE ON FLOOR TO MARK THE MIDLINE POSITION which is even with middle of sink.  You also should try to feel with your limb pressure in socket.  You are trying to feel with limb what you used to feel with the bottom of your foot.  Side to Side Shift: Moving your hips only (not shoulders): move weight onto your left leg, HOLD/FEEL pressure in socket.  Move back to equal weight on each leg, HOLD/FEEL pressure in socket. Move weight onto your right leg, HOLD/FEEL pressure in socket. Move back to equal weight on each leg, HOLD/FEEL pressure in socket. Repeat.  Start with both hands on sink, progress to hand on prosthetic side only, then no hands.  Front to Back Shift: Moving your hips only (not shoulders): move your weight forward onto your toes,  HOLD/FEEL pressure in socket. Move your weight back to equal Flat Foot on both legs, HOLD/FEEL  pressure in socket. Move your weight back onto your heels, HOLD/FEEL  pressure in socket. Move your weight back to equal on both legs, HOLD/FEEL  pressure in socket. Repeat.  Start with both hands on sink, progress  to hand on prosthetic side only, then no hands.  Moving Cones / Cups: With equal weight on each leg: Hold on with one hand the first time, then progress to no hand supports. Move cups from one side of sink to the other. Place cups ~2" out of your reach, progress to 10" beyond reach.  Place one hand in middle of sink and reach with other hand. Do both arms.  Then hover one hand and move cups with other hand.  Overhead/Upward Reaching: alternated reaching up to top cabinets or ceiling if no cabinets present. Keep equal weight on each leg. Start with one hand support on counter while other hand reaches and progress to no hand support with reaching.  ace one hand in middle of sink and reach with other hand. Do both arms.  Then hover one hand and move cups with other hand.  5.   Looking Over Shoulders: With equal weight on each leg: alternate turning to look over your shoulders with one hand support on counter as needed.  Start with head motions only to look in front of shoulder, then even with shoulder and progress to looking behind you. To look to side, move head /eyes, then shoulder on side looking pulls back, shift more weight to side looking and pull hip back. Place one hand in middle of sink and let go with other hand so your shoulder can pull back. Switch hands to look other way.   Then hover one hand and look over shoulder. If looking right, use left hand at sink. If looking left, use right hand at sink. 6.  Alternate Tapping Toes or Stepping to lower shelf of cabinet  Move items under cabinet out of your way. Shift your hips/pelvis so weight on stance leg & Tighten muscles in hip.  SLOWLY step other leg  so front of foot is in cabinet. Then step back to floor. Repeat with other leg.     ASSESSMENT: CLINICAL IMPRESSION: Patient is reporting increased balance with activities at home & less dizziness with movements.  He is on target to meet LTGs next week with planned PT discharge.   Pt continues to benefit from skilled PT to maximize function & safety.    OBJECTIVE IMPAIRMENTS .Abnormal gait, cardiopulmonary status limiting activity, decreased activity tolerance, decreased balance, decreased endurance, decreased knowledge of use of DME, decreased mobility, decreased ROM, decreased strength, dizziness, increased edema, impaired flexibility, postural dysfunction, prosthetic dependency , obesity, and pain.    ACTIVITY LIMITATIONS carrying, lifting, bending, standing, stairs, transfers, locomotion level, UE use with ADLs, sleep    PARTICIPATION LIMITATIONS: driving, community activity, and yard work, UE use with ADLs &  household activities   [place], Time since onset of injury/illness/exacerbation, and 3+ comorbidities: see PMH  are also affecting patient's functional outcome.    REHAB POTENTIAL: Good   CLINICAL DECISION MAKING: Evolving/moderate complexity   EVALUATION COMPLEXITY: Moderate     GOALS: Goals reviewed with patient? Yes  SHORT TERM GOALS: Target date: 09/29/2022  1. Patient able to pick up items from floor with single UE support on walker or surface. Baseline: SEE OBJECTIVE DATA Goal status: met 09/26/2022  2. Patient reports ambulating in home & entering/exiting home >90% of time over w/c use.  Baseline: SEE OBJECTIVE DATA Goal status: partially met 09/28/2022  3. Patient ambulates up to 200' with RW, prosthesis & AFO with supervision.  Goal status: partially met to level of supervision but not distance 09/28/2022  UPDATED LONG TERM GOALS: Target date: 10/26/2022   Patient demonstrates & verbalized understanding of prosthetic care to enable safe  utilization of prosthesis. Baseline: SEE OBJECTIVE DATA Goal status: ongoing 08/03/22    Patient tolerates right prosthesis & left AFO / partial foot prosthesis wear >90% of awake hours without skin or limb pain issues. Baseline: SEE OBJECTIVE DATA Goal status: ongoing 08/03/22    Berg Balance >/= 20/56 and tasks of Berg with RW support >/= 52/56 to indicate lower fall risk with standing ADLs Baseline: SEE OBJECTIVE DATA Goal status: 08/03/2022 updated    Patient ambulates >300' with prosthesis & LRAD modified independent Baseline: SEE OBJECTIVE DATA Goal status: ongoing 08/03/2022   Patient negotiates ramps, curbs & stairs with single rail with prosthesis & LRAD modified independent. 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    Jamey Reas, PT, DPT 10/17/2022, 9:28 AM

## 2022-10-19 ENCOUNTER — Ambulatory Visit (INDEPENDENT_AMBULATORY_CARE_PROVIDER_SITE_OTHER): Payer: Medicare Other | Admitting: Physical Therapy

## 2022-10-19 ENCOUNTER — Encounter: Payer: Self-pay | Admitting: Physical Therapy

## 2022-10-19 DIAGNOSIS — R2689 Other abnormalities of gait and mobility: Secondary | ICD-10-CM | POA: Diagnosis not present

## 2022-10-19 DIAGNOSIS — R293 Abnormal posture: Secondary | ICD-10-CM

## 2022-10-19 DIAGNOSIS — R2681 Unsteadiness on feet: Secondary | ICD-10-CM | POA: Diagnosis not present

## 2022-10-19 DIAGNOSIS — M6281 Muscle weakness (generalized): Secondary | ICD-10-CM | POA: Diagnosis not present

## 2022-10-19 NOTE — Therapy (Signed)
OUTPATIENT PHYSICAL THERAPY TREATMENT NOTE   Patient Name: Shane [name] MRN: [id number] DOB:07/28/1941, 80 y.o., male Today's Date: 10/19/2022  PCP: Burnard Bunting, MD REFERRING PROVIDER: [name], MD    END OF SESSION:   PT End of Session - 10/19/22 0806     Visit Number 45    Number of Visits 47    Date for PT Re-Evaluation 10/26/22    Authorization Type UHC Medicare    Authorization Time Period UHC MEDICARE  $20 COPAY    PT Start Time 0800    PT Stop Time 0254    PT Time Calculation (min) 44 min    Equipment Utilized During Treatment Gait belt    Activity Tolerance Patient tolerated treatment well;Patient limited by fatigue    Behavior During Therapy WFL for tasks assessed/performed                           Past Medical History:  Diagnosis Date   Anxiety    AV block, Mobitz 1    Cataract    Chronic kidney disease    d/t DM   CKD (chronic kidney disease), stage III ([place])    Depression    Diabetes mellitus    Vgo disposal insulin bolus  simular to insulin pump   Dyspnea    GERD (gastroesophageal reflux disease)    History of kidney stones    passed   Hyperlipidemia    Hypertension    Idiopathic pulmonary fibrosis ([place]) 11/2016   ILD (interstitial lung disease) ([place])    Moderate aortic stenosis    a. 10/2019 Echo: EF 55-60%, Gr2 DD. Nl RV.    Neuromuscular disorder ([place])    Neuropathy associated with endocrine disorder ([place])    Nonobstructive CAD (coronary artery disease)    a. 2012 Cath: mod, nonobs dzs; b. 10/2016 MV: EF 60%, no ischemia.   OSA on CPAP 05/05/2017   Unattended Home Sleep Test 7/2/813-AHI 38.6/hour, desaturation to 64%, body weight 261 pounds   PONV (postoperative nausea and vomiting)    Sleep apnea     uses cpap asked to bring mask and tubing   Past Surgical History:  Procedure Laterality Date   ABDOMINAL AORTOGRAM W/LOWER EXTREMITY N/A 12/10/2020   Procedure: ABDOMINAL AORTOGRAM W/LOWER EXTREMITY;  Surgeon:  Cherre Robins, MD;  Location: [facility];  Service: Cardiovascular;  Laterality: N/A;   AMPUTATION Right 01/22/2021   Procedure: 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;  Surgeon: [name], MD;  Location: [facility];  Service: Orthopedics;  Laterality: Right;   AMPUTATION Right 03/17/2021   Procedure: RIGHT BELOW KNEE AMPUTATION;  Surgeon: [name], MD;  Location: [facility];  Service: Orthopedics;  Laterality: Right;   ANKLE FUSION Right 01/22/2021   Procedure: RIGHT FOOT TIBIOCALCANEAL FUSION;  Surgeon: [name], MD;  Location: [facility];  Service: Orthopedics;  Laterality: Right;   ANTERIOR FUSION CERVICAL SPINE  2012   CARDIAC CATHETERIZATION  2011   CARDIAC CATHETERIZATION N/A 11/09/2016   Procedure: Right Heart Cath;  Surgeon: Belva Crome, MD;  Location: [facility];  Service: Cardiovascular;  Laterality: N/A;   carpel tunnel     left wrist   CATARACT EXTRACTION     CATARACT EXTRACTION W/ INTRAOCULAR LENS  IMPLANT, BILATERAL  2013   CERVICAL LAMINECTOMY  2012   COLONOSCOPY N/A 01/14/2013   Procedure: COLONOSCOPY;  Surgeon: Irene Shipper, MD;  Location: [facility];  Service:  Endoscopy;  Laterality: N/A;   CORONARY ARTERY BYPASS GRAFT  11/04/2020   LIMA-LAD, SVG-OM1, SVG-PDA (Dr [facility]) dc 11/18/2020   EYE SURGERY     I & D EXTREMITY Right 02/19/2021   Procedure: RIGHT ANKLE DEBRIDEMENT AND PLACEMENT ANTIBIOTIC BEADS;  Surgeon: [name], MD;  Location: [place];  Service: Orthopedics;  Laterality: Right;   KNEE SURGERY  1998   left   LEFT HEART CATH AND CORONARY ANGIOGRAPHY N/A 07/10/2020   Procedure: LEFT HEART CATH AND CORONARY ANGIOGRAPHY;  Surgeon: Sherren Mocha, MD;  Location: [place] [facility];  Service: Cardiovascular;  Laterality: N/A;   LUMBAR LAMINECTOMY  2003   LUNG BIOPSY Left 12/26/2016   Procedure: LUNG BIOPSY;  Surgeon: [name], MD;  Location: [facility];  Service: Thoracic;  Laterality: Left;   PACEMAKER IMPLANT N/A 03/30/2020   Procedure:  PACEMAKER IMPLANT;  Surgeon: Evans Lance, MD;  Location: [place] [facility];  Service: Cardiovascular;  Laterality: N/A;   PERIPHERAL VASCULAR INTERVENTION Right 12/10/2020   Procedure: PERIPHERAL VASCULAR INTERVENTION;  Surgeon: Cherre Robins, MD;  Location: [facility];  Service: Cardiovascular;  Laterality: Right;  SFA   POSTERIOR FUSION CERVICAL SPINE  2012   PPM GENERATOR REMOVAL N/A 12/14/2020   Procedure: PPM GENERATOR REMOVAL;  Surgeon: Evans Lance, MD;  Location: [facility];  Service: Cardiovascular;  Laterality: N/A;   TEE WITHOUT CARDIOVERSION N/A 12/11/2020   Procedure: TRANSESOPHAGEAL ECHOCARDIOGRAM (TEE);  Surgeon: Geralynn Rile, MD;  Location: [place];  Service: Cardiovascular;  Laterality: N/A;   TRIGGER FINGER RELEASE  2011   4th finger left hand   VIDEO ASSISTED THORACOSCOPY Left 12/26/2016   Procedure: VIDEO ASSISTED THORACOSCOPY;  Surgeon: [name], MD;  Location: [facility];  Service: Thoracic;  Laterality: Left;   VIDEO BRONCHOSCOPY N/A 12/26/2016   Procedure: VIDEO BRONCHOSCOPY;  Surgeon: [name], MD;  Location: [facility];  Service: Thoracic;  Laterality: N/A;   Patient Active Problem List   Diagnosis Date Noted   Intermediate stage nonexudative age-related macular degeneration of both eyes 04/25/2022   Research study patient 12/24/2021   Post-operative pain    CKD (chronic kidney disease), stage II    Labile blood glucose    Pulmonary fibrosis ([place])    Hyponatremia    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[place]) 03/26/2021   S/P BKA (below knee amputation) unilateral, right ([place])    Shortness of breath    Acute blood loss anemia    Stage 3 chronic kidney disease ([place])    Atrial fibrillation ([place])    Controlled type 2 diabetes mellitus with hyperglycemia, with long-term current use of insulin ([facility])    Postoperative pain    Postoperative hemorrhagic shock 03/20/2021   Atherosclerosis of native arteries of extremities with  gangrene, right leg ([place])    Hardware complicating wound infection ([place])    Abscess of bursa of right ankle 02/15/2021   Endocarditis 01/19/2021   Peripheral vascular disease ([place]) 12/29/2020   CHF (congestive heart failure), NYHA class II, chronic, diastolic ([place]) 16/09/9603   History of COVID-19 12/22/2020   SSS (sick sinus syndrome) ([place]) 12/22/2020   Acute bacterial endocarditis    MSSA bacteremia 12/09/2020   Diabetic foot ulcer ([place]) 12/09/2020   Foot drop, right foot    Generalized weakness 12/08/2020   Dehydration with hyponatremia 12/08/2020   Atrial fibrillation, chronic (H[facility]) 12/08/2020   Elevated troponin level not due myocardial infarction 12/08/2020   Adrenal  insufficiency ([place]) 11/14/2020   Orthostatic hypotension 11/14/2020   Physical deconditioning 11/14/2020   Difficulty sleeping 11/07/2020   Postoperative anemia due to acute blood loss 11/07/2020   Right ventricular dysfunction 11/05/2020   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/04/2020   Hearing loss 11/03/2020   Cardiac device in situ 09/09/2020   Coronary atherosclerosis due to lipid rich plaque 09/02/2020   COVID-19 virus infection 07/18/2020   Coronary artery disease involving native heart without angina pectoris 07/10/2020   Chronic kidney disease, stage 3a ([place]) 03/29/2020   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/28/2020   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 ([place]) 03/16/2020   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 ([place]) 03/16/2020   Right epiretinal membrane 03/16/2020   Posterior vitreous detachment of right eye 03/16/2020   Aortic stenosis, moderate 03/12/2020   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   Near syncope 04/27/2018   Hypoglycemia due to insulin 01/06/2018   Essential hypertension 01/06/2018   Diabetes mellitus type 2, with complication, on long term insulin pump ([place]) 01/06/2018   Syncope and  collapse 01/05/2018   Encounter for therapeutic drug monitoring 06/29/2017   OSA on CPAP 05/05/2017   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[place]) 01/05/2017   Abnormal chest x-ray 10/12/2016   Benign neoplasm of colon 01/14/2013    REFERRING DIAG: Z89.511 (ICD-10-CM) - Hx of right BKA   ONSET DATE: 05/04/2022 MD visit with release to WB on LLE  THERAPY DIAG:  Unsteadiness on feet  Other abnormalities of gait and mobility  Muscle weakness (generalized)  Abnormal posture  Rationale for Evaluation and Treatment Rehabilitation  PERTINENT HISTORY: CKD st III, AV block Mobitz, pacemaker, pulmonary fibrosis, CAD, CABG 21, HTN, A-Fib, DM2, PAD, right BKA, Left TMA  PRECAUTIONS: ICD/Pacemaker - continue monitoring vitals with activity and calculated HRmax is 151 bpm  SUBJECTIVE: He has increased congestion because of leaf blowing yesterday.  PAIN:    Are you having pain? Yes: NPRS scale: today  0/10, over last week lowest 0/10  Pain location: left shoulder lateral just distal to joint Pain description: tingling when shoulder in improper posture. Aggravating factors: improper posture. Relieving factors: correcting posture   OBJECTIVE: (objective measures completed at initial evaluation unless otherwise dated)  COGNITION: Overall cognitive status: Within functional limits for tasks assessed   POSTURE: 05/09/2022:  rounded shoulders, forward head, and flexed trunk    LOWER EXTREMITY ROM:    UPPER EXTREMITY ROM:  ROM P:passive  A:active Left 06/23/22 Supine all limited by pain Left 08/01/22 Seated All painfree Right 08/01/22 Seated  Shoulder flexion 30* A: 148 A: 153  Shoulder extension 0* neutral A: 72 A: 74  Shoulder abduction 45* A: 135 A: 141  Shoulder adduction     Shoulder internal rotation 20* A: 84 A: 89  Shoulder external rotation 10* A: 88 A: 83  Elbow flexion     Elbow extension -15*     (Blank rows = not tested)    ROM P:passive  A:active  Right Eval 05/09/2022 Left Eval 05/09/2022 Bilateral 08/03/22  Hip flexion       Hip extension A: -25* standing A: -25* standing A: -15*  RW support standing  Hip abduction       Hip adduction       Hip internal rotation       Hip external rotation       Knee flexion  Knee extension       Ankle dorsiflexion NA P: -17*   Ankle plantarflexion NA     Ankle inversion NA     Ankle eversion NA      (Blank rows = not tested)   LOWER EXTREMITY MMT:   MMT Right Eval 05/09/2022 Left Eval 05/09/2022  Hip flexion      Hip extension Functional testing 3-/5 Functional testing 3-/5  Hip abduction Functional testing 3-/5 Functional testing 3-/5  Hip adduction      Hip internal rotation      Hip external rotation      Knee flexion Functional testing 3-/5 Functional testing 3-/5  Knee extension Functional testing 3/5 Functional testing 3/5  Ankle dorsiflexion      Ankle plantarflexion      Ankle inversion      Ankle eversion      (Blank rows = not tested)   POSTURE:  06/23/2022  seated - head forward, rounded shoulders and posterior pelvic tilt  TRANSFERS: 05/09/2022:  Sit to stand: SBA and requires use of armrests from 20" stable w/c & needs RW for stabilization 05/09/2022:  Stand to sit: SBA and requires use of armrests from 20" stable w/c & needs RW for stabilization   GAIT: 08/11/2022: Pt amb 100' with rollator, right TTA prosthesis & left AFO partial shoe filler with SBA. He demonstrates increased step length bil., intermittent impaired foot clearance on RLE  08/03/2022: 2-minute Walk Test with RW, right TTA prosthesis & left AFO partial shoe filler. Resting pre HR 70 SpO2 100% HR after activity HR 52  SpO2 100% light amount of light headness Distance 158' with supervision.   Gait pattern: step-to pattern, but distance improved since evaluation, Right hip hike, trunk flexed, and normal 4" BOS  Pt neg ramp & curb with RW, ight TTA prosthesis & left AFO partial shoe filler with  supervision. He required seated rest half way due to fatigue.    05/09/2022:  Gait pattern: step to pattern, decreased step length- Left, decreased stance time- Right, Right hip hike, antalgic, lateral hip instability, trunk flexed, and wide BOS Distance walked: 25' Assistive device utilized: Environmental consultant - 2 wheeled and TTA prosthesis RLE Level of assistance: Min A Comments: pt reports dizziness with turning in gait   UE FUNCTION: 06/23/2022:  Pt reports that he uses RUE for fine motor like writing but uses both hands for gross motor or sports.  Seated - pt unable to raise LUE in flexion or abd due to pain.  Supine - pt unable to move LUE in flexion or abd due to pain.  PROM flexion > pain than abd   Pt tolerates elbow ext with palm supinated but with palm neutral causes shoulder pain. Also painful with elbow already extended and pronate palm.   PT simulated pushing on RW thru LUE with manual resistance.  Pt reports slight increase in pain but not as sharp as above function.    FUNCTIONAL TESTs:  Merrilee Jansky Balance Scale: 08/03/2022:  13/56 & tasks of Berg with RW support 45/56   Berg Balance Scale: 05/09/2022:  9/56 & tasks of Berg with RW support 27/56    CURRENT PROSTHETIC WEAR ASSESSMENT: 08/03/2022: Pt wears both right TTA prosthesis & left AFO partial shoe filler>90% of awake hours.  He has 2 wounds on dorsum of left foot being monitored by PCP.  He is wearing left Vive Wear compression sock below knee on LLE which has helped wounds to heal. Pt is independent  in donning & doffing both prosthesis & AFO. Skin check, cleaning and minor cues on adjusting ply socks.   05/09/2022:   Patient is dependent with: skin check, residual limb care, care of non-amputated limb, prosthetic cleaning, ply sock cleaning, and correct ply sock adjustment Donning prosthesis: Modified independence Doffing prosthesis: Modified independence Prosthetic wear tolerance: >90% of awake hours/day, 7 days/week Prosthetic weight  bearing tolerance: 5 minutes Edema: LLE pitting edema with >10 sec capillary refill Residual limb condition: Right BKA limb has no openings, normal temperature & moisture.  Redness patella & patella tendon from friction of liner with knee flexed. Cylinderical shape.  LLE TMA pt's wife showed picture of wound on dorsum of foot that is being managed by PCP.   Prosthetic description: silicon liner with pin lock suspension, total contact socket, dynamic response foot.      TODAY'S TREATMENT:  10/19/2022 Neuromuscular Re-ed: Working on step strategy ant, post & lateral right /left with RW support - anticipatory stepping off 4" step alternating LEs to catch self, progressed to reactionary with PT removing trunk resistance. Standing crossways on foam beam with light BUE support on RW head movements 4 directions &  static eyes closed 10 sec.  PT educated pt & wife on benefits to add to ongoing HEP to keep threshold for vestibular system higher & minimize dizziness. PT demo & verbal cues on progressing by moving feet closer together if wide stance is not providing some challenge. Pt & wife verbalized understanding.  Therapeutic Exercise: Step up, over & down 6" block BUE support leading ea LE 3 reps. Hip alternating kicks standing with BUE support green theraband 5 reps ea flex, abd & ext with knee extended. Pt verbalizes understanding as HEP.  10/17/2022 Therapeutic Exercise: Step up, over & down 6" block BUE support leading ea LE 5 reps & 6" 2 reps ea LE with noted less control but safe. Hip alternating kicks standing with BUE support green theraband 5 reps ea flex, abd & ext with knee extended. Pt verbalizes understanding as HEP.  Neuromuscular Re-ed: Working on step strategy ant, post & lateral right /left with RW support - anticipatory stepping off 4" step alternating LEs to catch self, progressed to reactionary with PT removing trunk resistance. Standing crossways on foam beam with light BUE  support on RW head movements 4 directions &  static eyes closed 10 sec.  PT educated pt & wife on benefits to add to ongoing HEP to keep threshold for vestibular system higher & minimize dizziness. Pt & wife verbalized understanding.    10/12/2022 Neuromuscular Re-ed: Working on step strategy ant, post & lateral right /left with RW support standing crossways on beam - anticipatory stepping off alternating LEs to catch self, progressed to reactionary with PT removing trunk resistance. Standing crossways on foam beam with single UE support on RW using mirror for upright posture alternating LEs 5 reps ea - forward kicks, abduction, extension & adduction both ant/post to stance LE. Standing crossways on foam beam with light BUE support on RW static eyes closed 10 sec   PT demo & verbal cues how to work on balance as ongoing HEP. HO in patient instructions Corner with walker in front of you: Alternate legs kicking / moving leg a.forward b.out to side c.backwards d. Across in front  e.across in back Stand with eyes closed 10 seconds Stand on 2" X 6"  step off a. Forward b. Backward into corner c. To right into corner  d.to left into corner  Walker step up & down on 4" block. Lead with both legs.    Walker support reaching to floor: reach one hand at time to cross bar then to floor. Alternate lead arms.        Pt & wife verbalized understanding.   Pt continues to need short frequent rests between activities due to [place]: [email or web address] URL: [email or web address] Date: 07/05/2022 Prepared by: Jamey Reas  Exercises - Seated Correct Posture  - 1 x daily - 7 x weekly - Seated Scapular Retraction  - 2-5 x daily - 7 x weekly - 1 sets - 10 reps - 5 seconds hold - Supine Chin Tucks on Flat Ball  - 2-5 x daily - 7 x weekly - 1 sets - 10 reps - 5 seconds hold - Seated Isometric Cervical Retraction with Chin Tucks with Pillow behind head  - 2-5 x  daily - 7 x weekly - 1 sets - 10 reps - 5 seconds hold - Circular Shoulder Pendulum with Table Support  - 1-2 x daily - 7 x weekly - 1-2 sets - 10 reps - 5 seconds hold - Flexion-Extension Shoulder Pendulum with Table Support  - 1-2 x daily - 7 x weekly - 1-2 sets - 10 reps - Horizontal Shoulder Pendulum with Table Support  - 1-2 x daily - 7 x weekly - 1-2 sets - 10 reps - Supine Shoulder Flexion Extension AAROM with Dowel  - 1 x daily - 7 x weekly - 1-2 sets - 10 reps - 5 seconds hold - Supine Shoulder Abduction AAROM with Dowel  - 1 x daily - 7 x weekly - 1-2 sets - 10 reps - 5 seconds hold - Supine Shoulder External Rotation with Dowel  - 1 x daily - 7 x weekly - 1-2 sets - 10 reps - 5 seconds hold   Neuromuscular Reed-  cues on proprioception RLE using residual limb, balance & standing tolerance.   Do each exercise 1-2  times per day Do each exercise 5-10 repetitions Hold each exercise for 2 seconds to feel your location  AT [place].  Try to find this position when standing still for activities.   USE TAPE ON FLOOR TO MARK THE MIDLINE POSITION which is even with middle of sink.  You also should try to feel with your limb pressure in socket.  You are trying to feel with limb what you used to feel with the bottom of your foot.  Side to Side Shift: Moving your hips only (not shoulders): move weight onto your left leg, HOLD/FEEL pressure in socket.  Move back to equal weight on each leg, HOLD/FEEL pressure in socket. Move weight onto your right leg, HOLD/FEEL pressure in socket. Move back to equal weight on each leg, HOLD/FEEL pressure in socket. Repeat.  Start with both hands on sink, progress to hand on prosthetic side only, then no hands.  Front to Back Shift: Moving your hips only (not shoulders): move your weight forward onto your toes, HOLD/FEEL pressure in socket. Move your weight back to equal Flat Foot on both legs,  HOLD/FEEL  pressure in socket. Move your weight back onto your heels, HOLD/FEEL  pressure in socket. Move your weight back to equal on both legs, HOLD/FEEL  pressure in socket. Repeat.  Start with both hands on sink, progress to hand on prosthetic side only, then no hands.  Moving Cones /  Cups: With equal weight on each leg: Hold on with one hand the first time, then progress to no hand supports. Move cups from one side of sink to the other. Place cups ~2" out of your reach, progress to 10" beyond reach.  Place one hand in middle of sink and reach with other hand. Do both arms.  Then hover one hand and move cups with other hand.  Overhead/Upward Reaching: alternated reaching up to top cabinets or ceiling if no cabinets present. Keep equal weight on each leg. Start with one hand support on counter while other hand reaches and progress to no hand support with reaching.  ace one hand in middle of sink and reach with other hand. Do both arms.  Then hover one hand and move cups with other hand.  5.   Looking Over Shoulders: With equal weight on each leg: alternate turning to look over your shoulders with one hand support on counter as needed.  Start with head motions only to look in front of shoulder, then even with shoulder and progress to looking behind you. To look to side, move head /eyes, then shoulder on side looking pulls back, shift more weight to side looking and pull hip back. Place one hand in middle of sink and let go with other hand so your shoulder can pull back. Switch hands to look other way.   Then hover one hand and look over shoulder. If looking right, use left hand at sink. If looking left, use right hand at sink. 6.  Alternate Tapping Toes or Stepping to lower shelf of cabinet  Move items under cabinet out of your way. Shift your hips/pelvis so weight on stance leg & Tighten muscles in hip.  SLOWLY step other leg so front of foot is in cabinet. Then step back to floor. Repeat with other leg.      ASSESSMENT: CLINICAL IMPRESSION: Pt appears to be using LEs more with less reliance on UEs for balance reactions.  He is on target to meet LTGs next week with planned PT discharge.   Pt & wife feel that skilled PT has helped to maximize function & safety.    OBJECTIVE IMPAIRMENTS .Abnormal gait, cardiopulmonary status limiting activity, decreased activity tolerance, decreased balance, decreased endurance, decreased knowledge of use of DME, decreased mobility, decreased ROM, decreased strength, dizziness, increased edema, impaired flexibility, postural dysfunction, prosthetic dependency , obesity, and pain.    ACTIVITY LIMITATIONS carrying, lifting, bending, standing, stairs, transfers, locomotion level, UE use with ADLs, sleep    PARTICIPATION LIMITATIONS: driving, community activity, and yard work, UE use with ADLs &  household activities   [place], Time since onset of injury/illness/exacerbation, and 3+ comorbidities: see PMH  are also affecting patient's functional outcome.    REHAB POTENTIAL: Good   CLINICAL DECISION MAKING: Evolving/moderate complexity   EVALUATION COMPLEXITY: Moderate     GOALS: Goals reviewed with patient? Yes  SHORT TERM GOALS: Target date: 09/29/2022  1. Patient able to pick up items from floor with single UE support on walker or surface. Baseline: SEE OBJECTIVE DATA Goal status: met 09/26/2022  2. Patient reports ambulating in home & entering/exiting home >90% of time over w/c use.  Baseline: SEE OBJECTIVE DATA Goal status: partially met 09/28/2022  3. Patient ambulates up to 200' with RW, prosthesis & AFO with supervision.  Goal status: partially met to level of supervision but not distance 09/28/2022     UPDATED LONG TERM GOALS: Target date: 10/26/2022   Patient  demonstrates & verbalized understanding of prosthetic care to enable safe utilization of prosthesis. Baseline: SEE OBJECTIVE DATA Goal status: ongoing 08/03/22     Patient tolerates right prosthesis & left AFO / partial foot prosthesis wear >90% of awake hours without skin or limb pain issues. Baseline: SEE OBJECTIVE DATA Goal status: ongoing 08/03/22    Berg Balance >/= 20/56 and tasks of Berg with RW support >/= 52/56 to indicate lower fall risk with standing ADLs Baseline: SEE OBJECTIVE DATA Goal status: 08/03/2022 updated    Patient ambulates >300' with prosthesis & LRAD modified independent Baseline: SEE OBJECTIVE DATA Goal status: ongoing 08/03/2022   Patient negotiates ramps, curbs & stairs with single rail with prosthesis & LRAD modified independent. Baseline: SEE OBJECTIVE DATA Goal status: ongoing 08/03/22  PLAN: PT FREQUENCY: 2x/week   PT DURATION: 12 weeks  PLANNED INTERVENTIONS: Therapeutic exercises, Therapeutic activity, Neuromuscular re-education, Balance training, Gait training, Patient/Family education, Self Care, Stair training, Vestibular training, Canalith repositioning, Prosthetic training, DME instructions, Re-evaluation, and physical performance testing    PLAN FOR NEXT SESSION:    check gait LTGs for distance, ramps & curbs with his rollator or our std RW, check stairs with single rail side stepping. If time continue balance activities.    Jamey Reas, PT, DPT 10/19/2022, 9:06 AM

## 2022-10-24 ENCOUNTER — Encounter: Payer: Medicare Other | Admitting: Physical Therapy

## 2022-10-26 ENCOUNTER — Encounter: Payer: Medicare Other | Admitting: Physical Therapy

## 2022-10-31 ENCOUNTER — Encounter: Payer: Medicare Other | Admitting: Physical Therapy

## 2022-10-31 ENCOUNTER — Ambulatory Visit (INDEPENDENT_AMBULATORY_CARE_PROVIDER_SITE_OTHER): Payer: Medicare Other | Admitting: Physical Therapy

## 2022-10-31 ENCOUNTER — Encounter: Payer: Self-pay | Admitting: Physical Therapy

## 2022-10-31 DIAGNOSIS — M6281 Muscle weakness (generalized): Secondary | ICD-10-CM

## 2022-10-31 DIAGNOSIS — R293 Abnormal posture: Secondary | ICD-10-CM | POA: Diagnosis not present

## 2022-10-31 DIAGNOSIS — R2689 Other abnormalities of gait and mobility: Secondary | ICD-10-CM

## 2022-10-31 DIAGNOSIS — Z9181 History of falling: Secondary | ICD-10-CM

## 2022-10-31 DIAGNOSIS — R2681 Unsteadiness on feet: Secondary | ICD-10-CM | POA: Diagnosis not present

## 2022-10-31 NOTE — Therapy (Signed)
OUTPATIENT PHYSICAL THERAPY TREATMENT NOTE & DISCHARGE SUMMARY   Patient Name: Shane [name] MRN: [id number] DOB:11-Feb-1941, 81 y.o., male Today's Date: 10/31/2022  PCP: Burnard Bunting, MD REFERRING PROVIDER: [name], MD  PHYSICAL THERAPY DISCHARGE SUMMARY  Visits from Start of Care: 32  Current functional level related to goals / functional outcomes: See below. Pt met or partially met all LTGs.   Remaining deficits: See belwo   Education / Equipment: Ongoing HEP and prosthetic care: pt & wife appear to have an understanding.    Patient agrees to discharge. Patient goals were met. Patient is being discharged due to meeting the stated rehab goals.   END OF SESSION:   PT End of Session - 10/31/22 0808     Visit Number 46    Number of Visits 47    Date for PT Re-Evaluation 10/26/22    Authorization Type UHC Medicare    Authorization Time Period UHC MEDICARE  $20 COPAY    PT Start Time 0800    PT Stop Time 0850    PT Time Calculation (min) 50 min    Equipment Utilized During Treatment Gait belt    Activity Tolerance Patient tolerated treatment well;Patient limited by fatigue    Behavior During Therapy WFL for tasks assessed/performed                           Past Medical History:  Diagnosis Date   Anxiety    AV block, Mobitz 1    Cataract    Chronic kidney disease    d/t DM   CKD (chronic kidney disease), stage III ([place])    Depression    Diabetes mellitus    Vgo disposal insulin bolus  simular to insulin pump   Dyspnea    GERD (gastroesophageal reflux disease)    History of kidney stones    passed   Hyperlipidemia    Hypertension    Idiopathic pulmonary fibrosis ([place]) 11/2016   ILD (interstitial lung disease) ([place])    Moderate aortic stenosis    a. 10/2019 Echo: EF 55-60%, Gr2 DD. Nl RV.    Neuromuscular disorder ([place])    Neuropathy associated with endocrine disorder ([place])    Nonobstructive CAD (coronary artery disease)     a. 2012 Cath: mod, nonobs dzs; b. 10/2016 MV: EF 60%, no ischemia.   OSA on CPAP 05/05/2017   Unattended Home Sleep Test 7/2/813-AHI 38.6/hour, desaturation to 64%, body weight 261 pounds   PONV (postoperative nausea and vomiting)    Sleep apnea     uses cpap asked to bring mask and tubing   Past Surgical History:  Procedure Laterality Date   ABDOMINAL AORTOGRAM W/LOWER EXTREMITY N/A 12/10/2020   Procedure: ABDOMINAL AORTOGRAM W/LOWER EXTREMITY;  Surgeon: Cherre Robins, MD;  Location: [facility];  Service: Cardiovascular;  Laterality: N/A;   AMPUTATION Right 01/22/2021   Procedure: 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;  Surgeon: [name], MD;  Location: [facility];  Service: Orthopedics;  Laterality: Right;   AMPUTATION Right 03/17/2021   Procedure: RIGHT BELOW KNEE AMPUTATION;  Surgeon: [name], MD;  Location: [facility];  Service: Orthopedics;  Laterality: Right;   ANKLE FUSION Right 01/22/2021   Procedure: RIGHT FOOT TIBIOCALCANEAL FUSION;  Surgeon: [name], MD;  Location: [facility];  Service: Orthopedics;  Laterality: Right;   ANTERIOR FUSION CERVICAL SPINE  2012   CARDIAC CATHETERIZATION  2011   CARDIAC CATHETERIZATION N/A 11/09/2016  Procedure: Right Heart Cath;  Surgeon: Belva Crome, MD;  Location: [facility];  Service: Cardiovascular;  Laterality: N/A;   carpel tunnel     left wrist   CATARACT EXTRACTION     CATARACT EXTRACTION W/ INTRAOCULAR LENS  IMPLANT, BILATERAL  2013   CERVICAL LAMINECTOMY  2012   COLONOSCOPY N/A 01/14/2013   Procedure: COLONOSCOPY;  Surgeon: Irene Shipper, MD;  Location: [facility];  Service: Endoscopy;  Laterality: N/A;   CORONARY ARTERY BYPASS GRAFT  11/04/2020   LIMA-LAD, SVG-OM1, SVG-PDA (Dr Marney [facility]) dc 11/18/2020   EYE SURGERY     I & D EXTREMITY Right 02/19/2021   Procedure: RIGHT ANKLE DEBRIDEMENT AND PLACEMENT ANTIBIOTIC BEADS;  Surgeon: [name], MD;  Location: [facility];  Service: Orthopedics;  Laterality: Right;   KNEE  SURGERY  1998   left   LEFT HEART CATH AND CORONARY ANGIOGRAPHY N/A 07/10/2020   Procedure: LEFT HEART CATH AND CORONARY ANGIOGRAPHY;  Surgeon: Sherren Mocha, MD;  Location: [facility];  Service: Cardiovascular;  Laterality: N/A;   LUMBAR LAMINECTOMY  2003   LUNG BIOPSY Left 12/26/2016   Procedure: LUNG BIOPSY;  Surgeon: [name], MD;  Location: [facility] [place];  Service: Thoracic;  Laterality: Left;   PACEMAKER IMPLANT N/A 03/30/2020   Procedure: PACEMAKER IMPLANT;  Surgeon: Evans Lance, MD;  Location: [facility];  Service: Cardiovascular;  Laterality: N/A;   PERIPHERAL VASCULAR INTERVENTION Right 12/10/2020   Procedure: PERIPHERAL VASCULAR INTERVENTION;  Surgeon: Cherre Robins, MD;  Location: [facility];  Service: Cardiovascular;  Laterality: Right;  SFA   POSTERIOR FUSION CERVICAL SPINE  2012   PPM GENERATOR REMOVAL N/A 12/14/2020   Procedure: PPM GENERATOR REMOVAL;  Surgeon: Evans Lance, MD;  Location: [facility];  Service: Cardiovascular;  Laterality: N/A;   TEE WITHOUT CARDIOVERSION N/A 12/11/2020   Procedure: TRANSESOPHAGEAL ECHOCARDIOGRAM (TEE);  Surgeon: Geralynn Rile, MD;  Location: [facility];  Service: Cardiovascular;  Laterality: N/A;   TRIGGER FINGER RELEASE  2011   4th finger left hand   VIDEO ASSISTED THORACOSCOPY Left 12/26/2016   Procedure: VIDEO ASSISTED THORACOSCOPY;  Surgeon: [name], MD;  Location: [place];  Service: Thoracic;  Laterality: Left;   VIDEO BRONCHOSCOPY N/A 12/26/2016   Procedure: VIDEO BRONCHOSCOPY;  Surgeon: [name], MD;  Location: [place] [facility];  Service: Thoracic;  Laterality: N/A;   Patient Active Problem List   Diagnosis Date Noted   Intermediate stage nonexudative age-related macular degeneration of both eyes 04/25/2022   Research study patient 12/24/2021   Post-operative pain    CKD (chronic kidney disease), stage II    Labile blood glucose    Pulmonary fibrosis ([place])     Hyponatremia    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[place]) 03/26/2021   S/P BKA (below knee amputation) unilateral, right ([place])    Shortness of breath    Acute blood loss anemia    Stage 3 chronic kidney disease ([place])    Atrial fibrillation ([place])    Controlled type 2 diabetes mellitus with hyperglycemia, with long-term current use of insulin (HCC)    Postoperative pain    Postoperative hemorrhagic shock 03/20/2021   Atherosclerosis of native arteries of extremities with gangrene, right leg ([place])    Hardware complicating wound infection ([place])    Abscess of bursa of right ankle 02/15/2021   Endocarditis 01/19/2021   Peripheral vascular disease ([place]) 12/29/2020   CHF (congestive heart failure),  NYHA class II, chronic, diastolic (HCC) 09/47/0962   History of COVID-19 12/22/2020   SSS (sick sinus syndrome) ([place]) 12/22/2020   Acute bacterial endocarditis    MSSA bacteremia 12/09/2020   Diabetic foot ulcer ([place]) 12/09/2020   Foot drop, right foot    Generalized weakness 12/08/2020   Dehydration with hyponatremia 12/08/2020   Atrial fibrillation, chronic (HCC) 12/08/2020   Elevated troponin level not due myocardial infarction 12/08/2020   Adrenal insufficiency ([place]) 11/14/2020   Orthostatic hypotension 11/14/2020   Physical deconditioning 11/14/2020   Difficulty sleeping 11/07/2020   Postoperative anemia due to acute blood loss 11/07/2020   Right ventricular dysfunction 11/05/2020   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/04/2020   Hearing loss 11/03/2020   Cardiac device in situ 09/09/2020   Coronary atherosclerosis due to lipid rich plaque 09/02/2020   COVID-19 virus infection 07/18/2020   Coronary artery disease involving native heart without angina pectoris 07/10/2020   Chronic kidney disease, stage 3a ([place]) 03/29/2020   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/28/2020   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 ([place]) 03/16/2020   Type 2 diabetes mellitus with proliferative diabetic  retinopathy of left eye without macular edema ([place]) 03/16/2020   Right epiretinal membrane 03/16/2020   Posterior vitreous detachment of right eye 03/16/2020   Aortic stenosis, moderate 03/12/2020   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   Near syncope 04/27/2018   Hypoglycemia due to insulin 01/06/2018   Essential hypertension 01/06/2018   Diabetes mellitus type 2, with complication, on long term insulin pump ([place]) 01/06/2018   Syncope and collapse 01/05/2018   Encounter for therapeutic drug monitoring 06/29/2017   OSA on CPAP 05/05/2017   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[place]) 01/05/2017   Abnormal chest x-ray 10/12/2016   Benign neoplasm of colon 01/14/2013    REFERRING DIAG: Z89.511 (ICD-10-CM) - Hx of right BKA   ONSET DATE: 05/04/2022 MD visit with release to [facility] on LLE  THERAPY DIAG:  Unsteadiness on feet  Other abnormalities of gait and mobility  Muscle weakness (generalized)  Abnormal posture  History of falling  Rationale for Evaluation and Treatment Rehabilitation  PERTINENT HISTORY: CKD st III, AV block Mobitz, pacemaker, pulmonary fibrosis, CAD, CABG 21, HTN, A-Fib, DM2, PAD, right BKA, Left TMA  PRECAUTIONS: ICD/Pacemaker - continue monitoring vitals with activity and calculated HRmax is 151 bpm  SUBJECTIVE: He was ill last week but continued to wear prosthesis & AFO most of awake hours. He did limit his activity due to the illness.   PAIN:    Are you having pain? Yes: NPRS scale: today  0/10, over last week lowest 0/10  Pain location: left shoulder lateral just distal to joint Pain description: tingling when shoulder in improper posture. Aggravating factors: improper posture. Relieving factors: correcting posture   OBJECTIVE: (objective measures completed at initial evaluation unless otherwise dated)  COGNITION: Overall cognitive status: Within functional limits for tasks assessed   POSTURE: 05/09/2022:  rounded shoulders, forward head,  and flexed trunk    LOWER EXTREMITY ROM:    UPPER EXTREMITY ROM:  ROM P:passive  A:active Left 06/23/22 Supine all limited by pain Left 08/01/22 Seated All painfree Right 08/01/22 Seated  Shoulder flexion 30* A: 148 A: 153  Shoulder extension 0* neutral A: 72 A: 74  Shoulder abduction 45* A: 135 A: 141  Shoulder adduction     Shoulder internal rotation 20* A: 84 A: 89  Shoulder external rotation 10* A: 88 A: 83  Elbow  flexion     Elbow extension -15*     (Blank rows = not tested)    ROM P:passive  A:active Right Eval 05/09/2022 Left Eval 05/09/2022 Bilateral 08/03/22  Hip flexion       Hip extension A: -25* standing A: -25* standing A: -15*  RW support standing  Hip abduction       Hip adduction       Hip internal rotation       Hip external rotation       Knee flexion       Knee extension       Ankle dorsiflexion NA P: -17*   Ankle plantarflexion NA     Ankle inversion NA     Ankle eversion NA      (Blank rows = not tested)   LOWER EXTREMITY MMT:   MMT Right Eval 05/09/2022 Left Eval 05/09/2022  Hip flexion      Hip extension Functional testing 3-/5 Functional testing 3-/5  Hip abduction Functional testing 3-/5 Functional testing 3-/5  Hip adduction      Hip internal rotation      Hip external rotation      Knee flexion Functional testing 3-/5 Functional testing 3-/5  Knee extension Functional testing 3/5 Functional testing 3/5  Ankle dorsiflexion      Ankle plantarflexion      Ankle inversion      Ankle eversion      (Blank rows = not tested)   POSTURE:  06/23/2022  seated - head forward, rounded shoulders and posterior pelvic tilt  TRANSFERS: 05/09/2022:  Sit to stand: SBA and requires use of armrests from 20" stable w/c & needs RW for stabilization 05/09/2022:  Stand to sit: SBA and requires use of armrests from 20" stable w/c & needs RW for stabilization   GAIT: 08/11/2022: Pt amb 100' with rollator, right TTA prosthesis & left AFO partial shoe filler  with SBA. He demonstrates increased step length bil., intermittent impaired foot clearance on RLE  08/03/2022: 2-minute Walk Test with RW, right TTA prosthesis & left AFO partial shoe filler. Resting pre HR 70 SpO2 100% HR after activity HR 52  SpO2 100% light amount of light headness Distance 158' with supervision.   Gait pattern: step-to pattern, but distance improved since evaluation, Right hip hike, trunk flexed, and normal 4" BOS  Pt neg ramp & curb with RW, ight TTA prosthesis & left AFO partial shoe filler with supervision. He required seated rest half way due to fatigue.    05/09/2022:  Gait pattern: step to pattern, decreased step length- Left, decreased stance time- Right, Right hip hike, antalgic, lateral hip instability, trunk flexed, and wide BOS Distance walked: 25' Assistive device utilized: Environmental consultant - 2 wheeled and TTA prosthesis RLE Level of assistance: Min A Comments: pt reports dizziness with turning in gait   UE FUNCTION: 06/23/2022:  Pt reports that he uses RUE for fine motor like writing but uses both hands for gross motor or sports.  Seated - pt unable to raise LUE in flexion or abd due to pain.  Supine - pt unable to move LUE in flexion or abd due to pain.  PROM flexion > pain than abd   Pt tolerates elbow ext with palm supinated but with palm neutral causes shoulder pain. Also painful with elbow already extended and pronate palm.   PT simulated pushing on RW thru LUE with manual resistance.  Pt reports slight increase in pain but not as sharp  as above function.    FUNCTIONAL TESTs:  Berg Balance scale: 10/31/2022  17/56 & tasks of Berg with RW support 49/56  [facility] PT Assessment - 10/31/22 0800       Standardized Balance Assessment   Standardized Balance Assessment Berg Balance Test      Berg Balance Test   Sit to Stand Able to stand  independently using hands   task with RW = 3   Standing Unsupported Needs several tries to stand 30 seconds unsupported   task with  RW = 4   Sitting with Back Unsupported but Feet Supported on Floor or Stool Able to sit safely and securely 2 minutes    Stand to Sit Controls descent by using hands   task with RW = 3   Transfers Able to transfer safely, minor use of hands    Standing Unsupported with Eyes Closed Unable to keep eyes closed 3 seconds but stays steady   task with RW = 4   Standing Unsupported with Feet Together Needs help to attain position and unable to hold for 15 seconds   task with RW = 4   From Standing, Reach Forward with Outstretched Arm Reaches forward but needs supervision   task with RW = 4   From Standing Position, Pick up Object from Floor Unable to try/needs assist to keep balance   task with RW = 4   From Standing Position, Turn to Look Behind Over each Shoulder Needs assist to keep from losing balance and falling   task with RW = 4   Turn 360 Degrees Needs assistance while turning   task with RW = 2   Standing Unsupported, Alternately Place Feet on Step/Stool Needs assistance to keep from falling or unable to try   task with RW = 4   Standing Unsupported, One Foot in Front Loses balance while stepping or standing   task with RW = 3   Standing on One Leg Unable to try or needs assist to prevent fall   task with RW = 4   Total Score 17    Berg comment: tasks of Berg with RW = 46/56   tasks of Berg with RW = 49/56            Berg Balance Scale: 08/03/2022:  13/56 & tasks of Berg with RW support 45/56  Berg Balance Scale: 05/09/2022:  9/56 & tasks of Berg with RW support 27/56    CURRENT PROSTHETIC WEAR ASSESSMENT: 08/03/2022: Pt wears both right TTA prosthesis & left AFO partial shoe filler>90% of awake hours.  He has 2 wounds on dorsum of left foot being monitored by PCP.  He is wearing left Vive Wear compression sock below knee on LLE which has helped wounds to heal. Pt is independent in donning & doffing both prosthesis & AFO. Skin check, cleaning and minor cues on adjusting ply socks.    05/09/2022:   Patient is dependent with: skin check, residual limb care, care of non-amputated limb, prosthetic cleaning, ply sock cleaning, and correct ply sock adjustment Donning prosthesis: Modified independence Doffing prosthesis: Modified independence Prosthetic wear tolerance: >90% of awake hours/day, 7 days/week Prosthetic weight bearing tolerance: 5 minutes Edema: LLE pitting edema with >10 sec capillary refill Residual limb condition: Right BKA limb has no openings, normal temperature & moisture.  Redness patella & patella tendon from friction of liner with knee flexed. Cylinderical shape.  LLE TMA pt's wife showed picture of wound on dorsum of foot that is  being managed by PCP.   Prosthetic description: silicon liner with pin lock suspension, total contact socket, dynamic response foot.      TODAY'S TREATMENT:  10/31/2022 Prosthetic Training with right TTA prosthesis & Left AFO partial foot prosthesis: Pt verbalizes independent prosthetic care with understanding for donning/doffing, cleaning, adjusting ply socks, checking skin and wear schedule. PT did recommend check with prosthetist to order new shoes as left has lateral lean with wear pattern. Pt & wife verbalized understanding.  Pt reports wear daily all awake hours including when ill (reduces activity but prostheses are available when he needs to move & reduces fall risk). Pt amb 280' with one standing rest at 200' with RW modified independent. Pt neg ramp & curb with RW modified independent. He reports neg stairs at job sites and his beach house without issues.   Berg Balance see objective for details.   10/19/2022 Neuromuscular Re-ed: Working on step strategy ant, post & lateral right /left with RW support - anticipatory stepping off 4" step alternating LEs to catch self, progressed to reactionary with PT removing trunk resistance. Standing crossways on foam beam with light BUE support on RW head movements 4 directions &   static eyes closed 10 sec.  PT educated pt & wife on benefits to add to ongoing HEP to keep threshold for vestibular system higher & minimize dizziness. PT demo & verbal cues on progressing by moving feet closer together if wide stance is not providing some challenge. Pt & wife verbalized understanding.  Therapeutic Exercise: Step up, over & down 6" block BUE support leading ea LE 3 reps. Hip alternating kicks standing with BUE support green theraband 5 reps ea flex, abd & ext with knee extended. Pt verbalizes understanding as HEP.  10/17/2022 Therapeutic Exercise: Step up, over & down 6" block BUE support leading ea LE 5 reps & 6" 2 reps ea LE with noted less control but safe. Hip alternating kicks standing with BUE support green theraband 5 reps ea flex, abd & ext with knee extended. Pt verbalizes understanding as HEP.  Neuromuscular Re-ed: Working on step strategy ant, post & lateral right /left with RW support - anticipatory stepping off 4" step alternating LEs to catch self, progressed to reactionary with PT removing trunk resistance. Standing crossways on foam beam with light BUE support on RW head movements 4 directions &  static eyes closed 10 sec.  PT educated pt & wife on benefits to add to ongoing HEP to keep threshold for vestibular system higher & minimize dizziness. Pt & wife verbalized understanding.   HOME EXERCISE PROGRAM  Corner with walker in front of you: Alternate legs kicking / moving leg a.forward b.out to side c.backwards d. Across in front  e.across in back Stand with eyes closed 10 seconds Stand on 2" X 6"  step off a. Forward b. Backward into corner c. To right into corner  d.to left into corner   Walker step up & down on 4" block. Lead with both legs.    Walker support reaching to floor: reach one hand at time to cross bar then to floor. Alternate lead arms.         Access Code: [email or web address] URL: [email or web address] Date: 07/05/2022 Prepared by:  Jamey Reas  Exercises - Seated Correct Posture  - 1 x daily - 7 x weekly - Seated Scapular Retraction  - 2-5 x daily - 7 x weekly - 1 sets - 10 reps - 5 seconds hold - Supine Chin Tucks on  Flat Ball  - 2-5 x daily - 7 x weekly - 1 sets - 10 reps - 5 seconds hold - Seated Isometric Cervical Retraction with Chin Tucks with Pillow behind head  - 2-5 x daily - 7 x weekly - 1 sets - 10 reps - 5 seconds hold - Circular Shoulder Pendulum with Table Support  - 1-2 x daily - 7 x weekly - 1-2 sets - 10 reps - 5 seconds hold - Flexion-Extension Shoulder Pendulum with Table Support  - 1-2 x daily - 7 x weekly - 1-2 sets - 10 reps - Horizontal Shoulder Pendulum with Table Support  - 1-2 x daily - 7 x weekly - 1-2 sets - 10 reps - Supine Shoulder Flexion Extension AAROM with Dowel  - 1 x daily - 7 x weekly - 1-2 sets - 10 reps - 5 seconds hold - Supine Shoulder Abduction AAROM with Dowel  - 1 x daily - 7 x weekly - 1-2 sets - 10 reps - 5 seconds hold - Supine Shoulder External Rotation with Dowel  - 1 x daily - 7 x weekly - 1-2 sets - 10 reps - 5 seconds hold   Neuromuscular Reed-  cues on proprioception RLE using residual limb, balance & standing tolerance.   Do each exercise 1-2  times per day Do each exercise 5-10 repetitions Hold each exercise for 2 seconds to feel your location  AT [place].  Try to find this position when standing still for activities.   USE TAPE ON FLOOR TO MARK THE MIDLINE POSITION which is even with middle of sink.  You also should try to feel with your limb pressure in socket.  You are trying to feel with limb what you used to feel with the bottom of your foot.  Side to Side Shift: Moving your hips only (not shoulders): move weight onto your left leg, HOLD/FEEL pressure in socket.  Move back to equal weight on each leg, HOLD/FEEL pressure in socket. Move weight onto your right leg, HOLD/FEEL pressure in  socket. Move back to equal weight on each leg, HOLD/FEEL pressure in socket. Repeat.  Start with both hands on sink, progress to hand on prosthetic side only, then no hands.  Front to Back Shift: Moving your hips only (not shoulders): move your weight forward onto your toes, HOLD/FEEL pressure in socket. Move your weight back to equal Flat Foot on both legs, HOLD/FEEL  pressure in socket. Move your weight back onto your heels, HOLD/FEEL  pressure in socket. Move your weight back to equal on both legs, HOLD/FEEL  pressure in socket. Repeat.  Start with both hands on sink, progress to hand on prosthetic side only, then no hands.  Moving Cones / Cups: With equal weight on each leg: Hold on with one hand the first time, then progress to no hand supports. Move cups from one side of sink to the other. Place cups ~2" out of your reach, progress to 10" beyond reach.  Place one hand in middle of sink and reach with other hand. Do both arms.  Then hover one hand and move cups with other hand.  Overhead/Upward Reaching: alternated reaching up to top cabinets or ceiling if no cabinets present. Keep equal weight on each leg. Start with one hand support on counter while other hand reaches and progress to no hand support with reaching.  ace one hand in middle of sink and reach with  other hand. Do both arms.  Then hover one hand and move cups with other hand.  5.   Looking Over Shoulders: With equal weight on each leg: alternate turning to look over your shoulders with one hand support on counter as needed.  Start with head motions only to look in front of shoulder, then even with shoulder and progress to looking behind you. To look to side, move head /eyes, then shoulder on side looking pulls back, shift more weight to side looking and pull hip back. Place one hand in middle of sink and let go with other hand so your shoulder can pull back. Switch hands to look other way.   Then hover one hand and look over shoulder. If  looking right, use left hand at sink. If looking left, use right hand at sink. 6.  Alternate Tapping Toes or Stepping to lower shelf of cabinet  Move items under cabinet out of your way. Shift your hips/pelvis so weight on stance leg & Tighten muscles in hip.  SLOWLY step other leg so front of foot is in cabinet. Then step back to floor. Repeat with other leg.     ASSESSMENT: CLINICAL IMPRESSION: Patient & wife are pleased with functional progress that patient has made under the care of PT.  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  The wound on his left foot has healed.  Pt met or partially met LTGs.      OBJECTIVE IMPAIRMENTS .Abnormal gait, cardiopulmonary status limiting activity, decreased activity tolerance, decreased balance, decreased endurance, decreased knowledge of use of DME, decreased mobility, decreased ROM, decreased strength, dizziness, increased edema, impaired flexibility, postural dysfunction, prosthetic dependency , obesity, and pain.    ACTIVITY LIMITATIONS carrying, lifting, bending, standing, stairs, transfers, locomotion level, UE use with ADLs, sleep    PARTICIPATION LIMITATIONS: driving, community activity, and yard work, UE use with ADLs &  household activities   [place], Time since onset of injury/illness/exacerbation, and 3+ comorbidities: see PMH  are also affecting patient's functional outcome.    REHAB POTENTIAL: Good   CLINICAL DECISION MAKING: Evolving/moderate complexity   EVALUATION COMPLEXITY: Moderate     GOALS: Goals reviewed with patient? Yes  SHORT TERM GOALS: Target date: 09/29/2022  1. Patient able to pick up items from floor with single UE support on walker or surface. Baseline: SEE OBJECTIVE DATA Goal status: met 09/26/2022  2. Patient reports ambulating in home & entering/exiting home >90% of time over w/c use.  Baseline: SEE OBJECTIVE DATA Goal status: partially met  09/28/2022  3. Patient ambulates up to 200' with RW, prosthesis & AFO with supervision.  Goal status: partially met to level of supervision but not distance 09/28/2022     UPDATED LONG TERM GOALS: Target date: 10/26/2022   Patient demonstrates & verbalized understanding of prosthetic care to enable safe utilization of prosthesis. Baseline: SEE OBJECTIVE DATA Goal status:  MET 10/31/2022   Patient tolerates right prosthesis & left AFO / partial foot prosthesis wear >90% of awake hours without skin or limb pain issues. Baseline: SEE OBJECTIVE DATA Goal status: MET 10/31/2022    Berg Balance >/= 15/56 and tasks of Berg with RW support >/= 48/56 to indicate lower fall risk with standing ADLs Baseline: SEE OBJECTIVE DATA Goal status: MET 10/31/2022   Patient ambulates >300' with prosthesis & LRAD modified independent Baseline: SEE OBJECTIVE DATA Goal status: partially MET 10/31/2022   Patient negotiates ramps, curbs &  stairs with single rail with prosthesis & LRAD modified independent. Baseline: SEE OBJECTIVE DATA Goal status: MET 10/31/2022  PLAN: PT FREQUENCY: 2x/week   PT DURATION: 12 weeks  PLANNED INTERVENTIONS: Therapeutic exercises, Therapeutic activity, Neuromuscular re-education, Balance training, Gait training, Patient/Family education, Self Care, Stair training, Vestibular training, Canalith repositioning, Prosthetic training, DME instructions, Re-evaluation, and physical performance testing    PLAN FOR NEXT SESSION:    Discharge PT    Jamey Reas, PT, DPT 10/31/2022, 9:10 AM

## 2022-11-02 ENCOUNTER — Encounter: Payer: Medicare Other | Admitting: Physical Therapy

## 2022-11-08 ENCOUNTER — Encounter: Payer: Medicare Other | Admitting: Physical Therapy

## 2022-11-09 ENCOUNTER — Encounter: Payer: Medicare Other | Admitting: Physical Therapy

## 2022-12-13 ENCOUNTER — Other Ambulatory Visit: Payer: Self-pay

## 2022-12-13 DIAGNOSIS — J84112 Idiopathic pulmonary fibrosis: Secondary | ICD-10-CM

## 2022-12-14 ENCOUNTER — Encounter: Payer: Medicare Other | Admitting: Internal Medicine

## 2022-12-14 ENCOUNTER — Ambulatory Visit (HOSPITAL_BASED_OUTPATIENT_CLINIC_OR_DEPARTMENT_OTHER)
Admission: RE | Admit: 2022-12-14 | Discharge: 2022-12-14 | Disposition: A | Discharge status: Home / Self Care | Payer: Medicare Other | Source: Ambulatory Visit | Attending: Internal Medicine | Admitting: Internal Medicine

## 2022-12-14 DIAGNOSIS — J84112 Idiopathic pulmonary fibrosis: Secondary | ICD-10-CM

## 2022-12-14 LAB — CALCIUM: Calcium: 8.8 mg/dL (ref 8.4–10.5)

## 2022-12-14 LAB — PHOSPHORUS: Phosphorus: 4 mg/dL (ref 2.3–4.6)

## 2022-12-14 LAB — ALKALINE PHOSPHATASE: Alkaline Phosphatase: 110 U/L (ref 39–117)

## 2022-12-14 LAB — VITAMIN D 25 HYDROXY (VIT D DEFICIENCY, FRACTURES): VITD: 32.29 ng/mL (ref 30.00–100.00)

## 2022-12-14 NOTE — Research (Signed)
[place]:  An Exploratory Assessment of Bone Disease in Idiopathic Pulmonary Fibrosis    Primary Endpoint  The primary objectives of this exploratory study are to broadly estimate: the prevalence and severity of osteopenia and osteoporosis in those with IPF, and the prevalence of vertebral fragility fracture(s) in those with IPF.  Study Code: [id number] Protocol Edition: Number 2.0, Date 03 August 2022 Sponsor Name: [employer] Primary Investigator: Dr. Kalman Shan  Protocol Version for 12/14/2022 date of 03 Aug 2022   Consent Version for 12/14/2022 date of 26 Oct 2022  Key Feature of Study: This is an exploratory study to broadly assess bone disease in those with IPF. Participants are expected to attend one visit consisting of informed consent, medical history, lab blood draw, lateral thoracic and lumbar x-rays, and a DXA bone density scan. X-rays and DXA scan may be done on the same day as screening visit or within 7 days after screening. Study participation is concluded once visit, labs, and radiologic studies completed.  Key Inclusion Criteria:   1. Male or male participants aged 50 years or older with mild-to-moderate IPF (defined as FVC >= 45% predicted at most recent measurement). 2.. Negative pregnancy test (high sensitivity urine pregnancy test) for male participants prior to imaging.  Key Exclusion Criteria: 1. Non-IPF interstitial lung disease. 2. (FEV1))/FVC ratio <0.70. 3. Active enrollment or recent participation (ie, within 3 months or less than 5 half-lives of investigational medical product whichever is longer) in an interventional clinical trial where participants received an investigational medical product or placebo. 4. Current systemic corticosteroid treatment. 5. Prior history of hip, thoracic spine, or lumbar spine surgery if residual hardware remains within these areas. 6. Prior history of single or bilateral lung transplantation. 7. Current or prior  history of a hematologic malignancy or metastatic cancer. 8. Nuclear medicine study within the past 1 week or an X-ray procedure using contrast within the past 72 hours. 9. Individuals who have had calcium supplements within 24 hours of the dual X-ray absorptiometry (DXA) scan. Note: Individuals using calcium supplements are not excluded if they temporarily discontinue their calcium 24 hours prior to their DXA scan. Calcium supplements may be resumed following their DXA scan. 10. Women who are pregnant.  Safety Data: Not Applicable.  No interventional product given.   [facility] [place] Clinical Research Coordinator note:   This visit for Subject Shane [name] with DOB: [id number] on 12/14/2022 for the above protocol is Visit/Encounter # 1  and is for purpose of research.   The consent for this encounter is under Protocol Version 2.0,  Consent Version 3.0 and  is currently IRB approved.   Subject expressed continued interest and consent in continuing as a study subject. Subject thanked for participation in research and contribution to science. In this visit 12/14/2022 the subject will be evaluated by Sub-Investigator named Rubye Oaks NP. This research coordinator has verified that the above investigator is  up to date with his/her training logs.   The Subject was  informed that the PI  continues to have oversight of the subject's visits and course through relevant discussions, reviews, and also specifically of this visit by routing of this note to the PI.   This visit is a key visit of screening. The PI is NOT available for this visit.    Because the PI is NOT available, the Sub-Investigator reported and CRC has confirmed that the PI has discussed the visit a-priori with the Sub-Investigator.   Signed by  Soledad Gerlach  Pool  Clinical Research Coordinator [place], [place] 10:19 AM 12/14/2022

## 2022-12-18 NOTE — Progress Notes (Signed)
Bone scan is normal. Spine xrays just mild age related change. You can have PCP [name], MD review results as well. Will not be calling with results

## 2022-12-23 ENCOUNTER — Other Ambulatory Visit: Payer: Self-pay

## 2023-01-26 ENCOUNTER — Encounter (INDEPENDENT_AMBULATORY_CARE_PROVIDER_SITE_OTHER): Payer: Medicare Other | Admitting: Ophthalmology

## 2023-02-02 ENCOUNTER — Telehealth: Payer: Self-pay | Admitting: Internal Medicine

## 2023-02-02 DIAGNOSIS — J84112 Idiopathic pulmonary fibrosis: Secondary | ICD-10-CM

## 2023-02-02 NOTE — Telephone Encounter (Signed)
Called and spoke with pt's spouse letting her know the recs per Dr. Chase Caller and she verbalized understanding. Orders for labwork have been placed. EKG has also been ordered and comment made in pt's appt notes section that the EKG will need to be done on day of visit.

## 2023-02-02 NOTE — Telephone Encounter (Signed)
I am seeing Shane [name] in a week or so. Pleas ensure EKG and cbc, bmet and LFT on or prior to the visit  Thanks    SIGNATURE    Dr. Brand Males, M.D., F.C.C.P,  Pulmonary and Critical Care Medicine Staff Physician, [place] Director - Interstitial Lung Disease  Program  Medical Director - [facility] at [place], [place], [address]   Pager: [phone], If no answer  -[place] or [place] [phone] Telephone (clinical office): [phone] Telephone (research): [phone]  8:54 AM 02/02/2023

## 2023-02-03 ENCOUNTER — Telehealth: Payer: Self-pay | Admitting: Internal Medicine

## 2023-02-03 NOTE — Telephone Encounter (Signed)
e

## 2023-02-03 NOTE — Telephone Encounter (Signed)
Patient was not feeling well today and was unable to come in for lab work. Advised wife per Dr. Golden Pop last note he could complete lab work the day of his appointment. She verbalized understanding. Nothing further needed.

## 2023-02-08 ENCOUNTER — Other Ambulatory Visit: Payer: Self-pay

## 2023-02-08 DIAGNOSIS — J8410 Pulmonary fibrosis, unspecified: Secondary | ICD-10-CM

## 2023-02-08 DIAGNOSIS — J84112 Idiopathic pulmonary fibrosis: Secondary | ICD-10-CM

## 2023-02-09 ENCOUNTER — Encounter: Payer: Self-pay | Admitting: Internal Medicine

## 2023-02-09 ENCOUNTER — Ambulatory Visit: Payer: Medicare Other | Admitting: Internal Medicine

## 2023-02-09 VITALS — BP 126/60 | HR 60 | Temp 98.1°F | Ht 72.0 in | Wt 239.2 lb

## 2023-02-09 DIAGNOSIS — J84112 Idiopathic pulmonary fibrosis: Secondary | ICD-10-CM

## 2023-02-09 DIAGNOSIS — R053 Chronic cough: Secondary | ICD-10-CM

## 2023-02-09 DIAGNOSIS — R0689 Other abnormalities of breathing: Secondary | ICD-10-CM | POA: Diagnosis not present

## 2023-02-09 LAB — HEPATIC FUNCTION PANEL
ALT: 19 U/L (ref 0–53)
AST: 20 U/L (ref 0–37)
Albumin: 3.6 g/dL (ref 3.5–5.2)
Alkaline Phosphatase: 121 U/L — ABNORMAL HIGH (ref 39–117)
Bilirubin, Direct: 0.1 mg/dL (ref 0.0–0.3)
Total Bilirubin: 0.4 mg/dL (ref 0.2–1.2)
Total Protein: 7 g/dL (ref 6.0–8.3)

## 2023-02-09 LAB — BASIC METABOLIC PANEL
BUN: 44 mg/dL — ABNORMAL HIGH (ref 6–23)
CO2: 31 mEq/L (ref 19–32)
Calcium: 9.3 mg/dL (ref 8.4–10.5)
Chloride: 98 mEq/L (ref 96–112)
Creatinine, Ser: 1.57 mg/dL — ABNORMAL HIGH (ref 0.40–1.50)
GFR: 41.02 mL/min — ABNORMAL LOW (ref >60.00)
Glucose, Bld: 145 mg/dL — ABNORMAL HIGH (ref 70–99)
Potassium: 3.9 mEq/L (ref 3.5–5.1)
Sodium: 138 mEq/L (ref 135–145)

## 2023-02-09 LAB — CBC WITH DIFFERENTIAL/PLATELET
Basophils Absolute: 0 10*3/uL (ref 0.0–0.1)
Basophils Relative: 0.5 % (ref 0.0–3.0)
Eosinophils Absolute: 0.5 10*3/uL (ref 0.0–0.7)
Eosinophils Relative: 6.4 % — ABNORMAL HIGH (ref 0.0–5.0)
HCT: 39.1 % (ref 39.0–52.0)
Hemoglobin: 12.9 g/dL — ABNORMAL LOW (ref 13.0–17.0)
Lymphocytes Relative: 22 % (ref 12.0–46.0)
Lymphs Abs: 1.7 10*3/uL (ref 0.7–4.0)
MCHC: 32.9 g/dL (ref 30.0–36.0)
MCV: 80.8 fl (ref 78.0–100.0)
Monocytes Absolute: 0.9 10*3/uL (ref 0.1–1.0)
Monocytes Relative: 11.9 % (ref 3.0–12.0)
Neutro Abs: 4.7 10*3/uL (ref 1.4–7.7)
Neutrophils Relative %: 59.2 % (ref 43.0–77.0)
Platelets: 232 10*3/uL (ref 150.0–400.0)
RBC: 4.84 Mil/uL (ref 4.22–5.81)
RDW: 16.3 % — ABNORMAL HIGH (ref 11.5–15.5)
WBC: 7.9 10*3/uL (ref 4.0–10.5)

## 2023-02-09 NOTE — Patient Instructions (Addendum)
ICD-10-CM   1. 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[place])  J84.112     2. Sighing respiration  R06.89       Mild steady progression  /worsening in lung function 2019 -> 2023, roughly 5% per year But overall you have done better against backdrop of all health issues I am not fully sure what to make of the "sigh" attacks that you have and the cough spells x 1 year We need to ensure fibrosis is not getting worse   PLAN - await labs 02/09/2023 - do HRCT at [place] - next few weeks - 3 months do spiroemtry and dlco  Followup - will call with CT results I next few weeks -3 months - 30 min visit; but after spiro/dlco   ILD symptoms score at followup  Can cancel if in research

## 2023-02-09 NOTE — Progress Notes (Signed)
Please he will       OV 09/23/2022  Subjective:  Patient ID: Shane [name], male , DOB: 1941-12-05 , age 82 y.o. , MRN: [id number] , ADDRESS: [place] [id number] PCP Burnard Bunting, MD Patient Care Team: Burnard Bunting, MD as PCP - General (Internal Medicine) Burnard Bunting, MD as PCP - Internal Medicine (Internal Medicine) Stanford Breed Denice Bors, MD as PCP - Cardiology (Cardiology) Evans Lance, MD as PCP - Electrophysiology (Cardiology)  This Provider for this visit: Treatment Team:  Attending Provider: Brand Males, MD    09/23/2022 -   Chief Complaint  Patient presents with   Follow-up    PFT performed today.  Pt states he has been doing okay since last visit. States he has had a little more SOB and also has had some problems with a cough.   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  Previously failed antifibrotic's  HPI [facility] 82 y.[facility]. -currently on supportive care after Zephyrus IV infusion study with pamrevlumab was terminated because of lack of efficacy.  Currently doing well.  Pulmonary function test done today shows a 21% decline in FVC over 5 years.  There is 5 %/year.  But nevertheless he is tolerating his pulmonary fibrosis well.  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  He has a right leg prosthesis.  He is working out with physical therapy and rehab.  He plans to start driving a car.  He did use excavator.  He does work on his Physicist, medical people to work.  He has had his flu shot.  Having been previously in clinical trials he is interested in clinical trials.  He does not want to rechallenge himself with antifibrotic.  1 issue that brought up is that he still continues to have the "sigh" episodes particularly at night.  The wife is concerned about this.  But they both admitted this was a chronic proble which they have  brought up to me in 2019.       OV 02/09/2023  Subjective:  Patient ID: Shane [name], male , DOB: 1941-01-19 , age 82 y.o. , MRN: [id number] , ADDRESS: [place]  [id number] PCP Marshell Garfinkel, MD Patient Care Team: Marshell Garfinkel, MD as PCP - General (Pulmonary Disease) Burnard Bunting, MD as PCP - Internal Medicine (Internal Medicine) Stanford Breed Denice Bors, MD as PCP - Cardiology (Cardiology) Evans Lance, MD as PCP - Electrophysiology (Cardiology)  This Provider for this visit: Treatment Team:  Attending Provider: Brand Males, MD  IPF patient on supportive care.  Previously failed antifibrotic's  02/09/2023 -   Chief Complaint  Patient presents with   Follow-up    Pt has coughing fits every few weeks. No longer than 5 minutes.      HPI Shane [name] 82 y.o. -returns for follow-up.  Presents with his wife Shane [name].  From a respiratory standpoint he feels he is doing stable although he did indicate for the last 1 year that sometime after dinner when he sits down in his recliner in an erect position he starts having a coughing spell that can happen randomly anytime 1 to 2 hours after dinner but also no specific time.Marland Kitchen  He says that he will try to take a deep breath and then suddenly this for lunch into a coughing fit.  He will then bring out some white sputum.  He feels the sputum is in the lung.  In the past he has reported similar spells of what is described as "sigh".  He says this is better but the cough itself is different for the last 1 year.  He was supposed to get pulmonary function test today but this did not happen because of scheduling issues.  His last CT scan of the chest was in 2020.  He continues to see his cardiologist at [facility].  He says he had a pacer check and everything checks out.  We are unable to do a walk test on him because of his prosthesis and also imbalance.  He says at home he can get from his chair to the  bathroom but he will have to seek assistance holding onto things.  He is interested in research protocols.     SYMPTOM SCALE - ILD 09/23/2022 02/09/2023   Current weight    O2 use ra ra  Shortness of Breath 0 -> 5 scale with 5 being worst (score 6 If unable to do)   At rest 0 1  Simple tasks - showers, clothes change, eating, shaving 0 1  Household (dishes, doing bed, laundry) 2 2  Shopping 2 2  Walking level at own pace 3 3  Walking up Stairs 3 5  Total (30-36) Dyspnea Score 10 14  How bad is your cough? 1 1  How bad is your fatigue 3 1  How bad is nausea 0 0  How bad is vomiting?  0 0  How bad is diarrhea? 0 0  How bad is anxiety? 0 0  How bad is depression 0 0  Any chronic pain - if so where and how bad x 0       PFT     Latest Ref Rng & Units 09/23/2022   12:29 PM 09/21/2018   12:08 PM 05/23/2018   10:37 AM 12/25/2017    9:01 AM 10/04/2017    9:35 AM 04/06/2017   10:13 AM 10/14/2016    8:19 AM  PFT Results  FVC-Pre L 2.27   2.89  P 2.88  2.94  2.94  3.00   FVC-Predicted Pre % 52   61  P 61  62  62  63   FVC-Post L       3.00   FVC-Predicted Post %       63   Pre FEV1/FVC % % 89   91  P 86  92  91  90   Post FEV1/FCV % %       92   FEV1-Pre L 2.01   2.62  P 2.47  2.69  2.69  2.70   FEV1-Predicted Pre % 65   77  P 72  79  78  78   FEV1-Post L       2.75   DLCO uncorrected ml/min/mmHg 11.84  14.95  17.13  P 14.56  15.04  16.43  16.82   DLCO UNC% % 45  42  47  P 40  41  45  46   DLCO corrected ml/min/mmHg 11.84  15.26  17.33  P 15.57  15.31  19.35    DLCO COR %Predicted % 45  43  47  P 42  42  53    DLVA Predicted % 97  83  91  P 83  79  99  81   TLC L       4.62   TLC % Predicted %  60   RV % Predicted %       60     P Preliminary result    LABS    PULMONARY No results for input(s): "PHART", "PCO2ART", "PO2ART", "HCO3", "TCO2", "O2SAT" in the last 168 hours.  Invalid input(s): "PCO2", "PO2"  CBC Recent Labs  Lab 02/09/23 1440  HGB 12.9*   HCT 39.1  WBC 7.9  PLT 232.0    COAGULATION No results for input(s): "INR" in the last 168 hours.  CARDIAC  No results for input(s): "TROPONINI" in the last 168 hours. No results for input(s): "PROBNP" in the last 168 hours.   CHEMISTRY Recent Labs  Lab 02/09/23 1440  NA 138  K 3.9  CL 98  CO2 31  GLUCOSE 145*  BUN 44*  CREATININE 1.57*  CALCIUM 9.3   Estimated Creatinine Clearance: 47 mL/min (A) (by C-G formula based on SCr of 1.57 mg/dL (H)).   LIVER Recent Labs  Lab 02/09/23 1440  AST 20  ALT 19  ALKPHOS 121*  BILITOT 0.4  PROT 7.0  ALBUMIN 3.6     INFECTIOUS No results for input(s): "LATICACIDVEN", "PROCALCITON" in the last 168 hours.   ENDOCRINE CBG (last 3)  No results for input(s): "GLUCAP" in the last 72 hours.       IMAGING x48h  - image(s) personally visualized  -   highlighted in bold No results found.     has a past medical history of Anxiety, AV block, Mobitz 1, Cataract, Chronic kidney disease, CKD (chronic kidney disease), stage III ([place]), Depression, Diabetes mellitus, Dyspnea, GERD (gastroesophageal reflux disease), History of kidney stones, Hyperlipidemia, Hypertension, Idiopathic pulmonary fibrosis ([place]) (11/2016), ILD (interstitial lung disease) ([place]), Moderate aortic stenosis, Neuromuscular disorder ([place]), Neuropathy associated with endocrine disorder ([place]), Nonobstructive CAD (coronary artery disease), OSA on CPAP (05/05/2017), PONV (postoperative nausea and vomiting), and Sleep apnea.   reports that he has never smoked. He has never used smokeless tobacco.  Past Surgical History:  Procedure Laterality Date   ABDOMINAL AORTOGRAM W/LOWER EXTREMITY N/A 12/10/2020   Procedure: ABDOMINAL AORTOGRAM W/LOWER EXTREMITY;  Surgeon: Cherre Robins, MD;  Location: [place] [facility];  Service: Cardiovascular;  Laterality: N/A;   AMPUTATION Right 01/22/2021   Procedure: 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;  Surgeon: [name], MD;  Location: [place];  Service: Orthopedics;  Laterality: Right;   AMPUTATION Right 03/17/2021   Procedure: RIGHT BELOW KNEE AMPUTATION;  Surgeon: [name], MD;  Location: [place];  Service: Orthopedics;  Laterality: Right;   ANKLE FUSION Right 01/22/2021   Procedure: RIGHT FOOT TIBIOCALCANEAL FUSION;  Surgeon: [name], MD;  Location: [place];  Service: Orthopedics;  Laterality: Right;   ANTERIOR FUSION CERVICAL SPINE  2012   CARDIAC CATHETERIZATION  2011   CARDIAC CATHETERIZATION N/A 11/09/2016   Procedure: Right Heart Cath;  Surgeon: Belva Crome, MD;  Location: [place] [facility];  Service: Cardiovascular;  Laterality: N/A;   carpel tunnel     left wrist   CATARACT EXTRACTION     CATARACT EXTRACTION W/ INTRAOCULAR LENS  IMPLANT, BILATERAL  2013   CERVICAL LAMINECTOMY  2012   COLONOSCOPY N/A 01/14/2013   Procedure: COLONOSCOPY;  Surgeon: Irene Shipper, MD;  Location: [facility];  Service: Endoscopy;  Laterality: N/A;   CORONARY ARTERY BYPASS GRAFT  11/04/2020   LIMA-LAD, SVG-OM1, SVG-PDA (Dr [facility]) dc 11/18/2020   EYE SURGERY     I & D EXTREMITY Right 02/19/2021   Procedure:  RIGHT ANKLE DEBRIDEMENT AND PLACEMENT ANTIBIOTIC BEADS;  Surgeon: [name], MD;  Location: [place];  Service: Orthopedics;  Laterality: Right;   KNEE SURGERY  1998   left   LEFT HEART CATH AND CORONARY ANGIOGRAPHY N/A 07/10/2020   Procedure: LEFT HEART CATH AND CORONARY ANGIOGRAPHY;  Surgeon: Sherren Mocha, MD;  Location: [place] [facility];  Service: Cardiovascular;  Laterality: N/A;   LUMBAR LAMINECTOMY  2003   LUNG BIOPSY Left 12/26/2016   Procedure: LUNG BIOPSY;  Surgeon: [name], MD;  Location: [place];  Service: Thoracic;  Laterality: Left;   PACEMAKER IMPLANT N/A 03/30/2020   Procedure: PACEMAKER IMPLANT;  Surgeon: Evans Lance, MD;  Location: [place] [facility];  Service: Cardiovascular;  Laterality: N/A;   PERIPHERAL VASCULAR INTERVENTION Right 12/10/2020   Procedure: PERIPHERAL VASCULAR  INTERVENTION;  Surgeon: Cherre Robins, MD;  Location: [place] [facility];  Service: Cardiovascular;  Laterality: Right;  SFA   POSTERIOR FUSION CERVICAL SPINE  2012   PPM GENERATOR REMOVAL N/A 12/14/2020   Procedure: PPM GENERATOR REMOVAL;  Surgeon: Evans Lance, MD;  Location: [place] [facility];  Service: Cardiovascular;  Laterality: N/A;   TEE WITHOUT CARDIOVERSION N/A 12/11/2020   Procedure: TRANSESOPHAGEAL ECHOCARDIOGRAM (TEE);  Surgeon: Geralynn Rile, MD;  Location: [place];  Service: Cardiovascular;  Laterality: N/A;   TRIGGER FINGER RELEASE  2011   4th finger left hand   VIDEO ASSISTED THORACOSCOPY Left 12/26/2016   Procedure: VIDEO ASSISTED THORACOSCOPY;  Surgeon: [name], MD;  Location: [place];  Service: Thoracic;  Laterality: Left;   VIDEO BRONCHOSCOPY N/A 12/26/2016   Procedure: VIDEO BRONCHOSCOPY;  Surgeon: [name], MD;  Location: [facility];  Service: Thoracic;  Laterality: N/A;    Allergies  Allergen Reactions   Codeine Hives and Itching   Ofev [Nintedanib] Diarrhea    SEVERE DIARRHEA   Pirfenidone Diarrhea and Other (See Comments)    Esbriet (Pirfenidone) causes elevated LFTs. D/C on 06/14/17 and SEVERE DIARRHEA     Immunization History  Administered Date(s) Administered   Influenza Split 08/14/2017   Influenza, High Dose Seasonal PF 08/05/2016, 09/02/2019, 09/05/2022   Influenza-Unspecified 10/13/2020   PFIZER Comirnaty(Gray Top)Covid-19 Tri-Sucrose Vaccine 12/28/2019, 01/18/2020, 09/27/2020   PFIZER(Purple Top)SARS-COV-2 Vaccination 12/28/2019, 01/18/2020   Pneumococcal Conjugate-13 01/14/2015   Pneumococcal Polysaccharide-23 12/05/2014    Family History  Problem Relation Age of Onset   Diabetes Mellitus II Mother    Emphysema Father 25   Heart attack Father    Colon cancer Neg Hx    Esophageal cancer Neg Hx    Rectal cancer Neg Hx    Stomach cancer Neg Hx      Current Outpatient Medications:    TRESIBA FLEXTOUCH 100  UNIT/ML FlexTouch Pen, Inject 20 Units into the skin daily., Disp: , Rfl:    acetaminophen (TYLENOL) 325 MG tablet, Take 1-2 tablets (325-650 mg total) by mouth every 6 (six) hours as needed for mild pain (pain score 1-3 or temp > 100.5). (Patient not taking: Reported on 02/09/2023), Disp: , Rfl:    albuterol (VENTOLIN HFA) 108 (90 Base) MCG/ACT inhaler, Inhale 2 puffs into the lungs every 4 (four) hours as needed for wheezing or shortness of breath., Disp: 6.7 g, Rfl: 0   apixaban (ELIQUIS) 5 MG TABS tablet, Take 1 tablet (5 mg total) by mouth 2 (two) times daily., Disp: 60 tablet, Rfl: 11   Ascorbic Acid (VITAMIN C WITH ROSE HIPS) 500 MG tablet, Take 1  tablet (500 mg total) by mouth daily., Disp: 30 tablet, Rfl: 0   atorvastatin (LIPITOR) 80 MG tablet, TAKE 1 TABLET BY MOUTH EVERY DAY, Disp: 90 tablet, Rfl: 3   clopidogrel (PLAVIX) 75 MG tablet, Take 75 mg by mouth daily., Disp: , Rfl:    docusate sodium (COLACE) 100 MG capsule, Take 1 capsule (100 mg total) by mouth daily., Disp: 10 capsule, Rfl: 0   escitalopram (LEXAPRO) 10 MG tablet, Take 1 tablet (10 mg total) by mouth daily., Disp: 30 tablet, Rfl: 0   furosemide (LASIX) 40 MG tablet, Take 1 tablet (40 mg total) by mouth daily., Disp: 30 tablet, Rfl: 0   gabapentin (NEURONTIN) 300 MG capsule, TAKE 1 CAPSULE BY MOUTH THREE TIMES A DAY, Disp: 180 capsule, Rfl: 3   insulin aspart (NOVOLOG FLEXPEN) 100 UNIT/ML FlexPen, Inject 6 Units into the skin 3 (three) times daily with meals., Disp: 15 mL, Rfl: 11   insulin glargine (LANTUS) 100 UNIT/ML Solostar Pen, Inject 8 Units into the skin at bedtime. (Patient not taking: Reported on 02/09/2023), Disp: 15 mL, Rfl: 11   iron polysaccharides (NIFEREX) 150 MG capsule, Take 1 capsule (150 mg total) by mouth daily with lunch. (Patient not taking: Reported on 02/09/2023), Disp: 30 capsule, Rfl: 0   LORazepam (ATIVAN) 1 MG tablet, Take 1 tablet (1 mg total) by mouth at bedtime., Disp: 30 tablet, Rfl: 0   metoprolol  tartrate (LOPRESSOR) 50 MG tablet, Take 0.5 tablets (25 mg total) by mouth daily., Disp: 30 tablet, Rfl: 0   Multiple Vitamin (MULTIVITAMIN WITH MINERALS) TABS tablet, Take 1 tablet by mouth daily., Disp: , Rfl:    Olopatadine HCl (PATADAY OP), Place 1 drop into both eyes daily as needed (allergies)., Disp: , Rfl:    pantoprazole (PROTONIX) 40 MG tablet, Take 1 tablet (40 mg total) by mouth 2 (two) times daily., Disp: 60 tablet, Rfl: 0   polyethylene glycol powder (GLYCOLAX/MIRALAX) 17 GM/SCOOP powder, Take 17 g by mouth daily as needed for moderate constipation., Disp: , Rfl:    Probiotic Product (PROBIOTIC PO), Take 1 capsule by mouth daily., Disp: , Rfl:    traMADol (ULTRAM) 50 MG tablet, Take 1 tablet (50 mg total) by mouth every 8 (eight) hours as needed for severe pain or moderate pain. (Patient not taking: Reported on 02/09/2023), Disp: 30 tablet, Rfl: 0   Zinc Oxide (TRIPLE PASTE) 12.8 % ointment, Apply 1 application topically 2 (two) times daily. Apply to affected area, Disp: 56.7 g, Rfl: 0   zinc sulfate 220 (50 Zn) MG capsule, Take 1 capsule (220 mg total) by mouth daily., Disp: 30 capsule, Rfl: 0      Objective:   Vitals:   02/09/23 1535  BP: 126/60  Pulse: 60  Temp: 98.1 F (36.7 C)  TempSrc: Oral  SpO2: 97%  Weight: 239 lb 3.2 oz (108.5 kg)  Height: 6' (1.829 m)    Estimated body mass index is 32.44 kg/m as calculated from the following:   Height as of this encounter: 6' (1.829 m).   Weight as of this encounter: 239 lb 3.2 oz (108.5 kg).  '@WEIGHTCHANGE'$ @  [employer]   02/09/23 1535  Weight: 239 lb 3.2 oz (108.5 kg)     Physical Exam    General: No distress.  Well-built strong male sitting on a wheelchair.  Has right lower extremity prosthesis. Neuro: Alert and Oriented x 3. GCS 15. Speech normal Psych: Pleasant Resp:  Barrel Chest - no.  Wheeze - no, Crackles -  yes, No overt respiratory distress CVS: Normal heart sounds. Murmurs - yes Ext: Stigmata of  Connective Tissue Disease - no HEENT: Normal upper airway. PEERL +. No post nasal drip        Assessment:       ICD-10-CM   1. 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HCC)  J84.112 CT Chest High Resolution    Pulmonary function test    2. Sighing respiration  R06.89     3. Chronic cough  R05.3          Plan:     Patient Instructions     ICD-10-CM   1. 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[place])  J84.112     2. Sighing respiration  R06.89       Mild steady progression  /worsening in lung function 2019 -> 2023, roughly 5% per year But overall you have done better against backdrop of all health issues I am not fully sure what to make of the "sigh" attacks that you have and the cough spells x 1 year We need to ensure fibrosis is not getting worse   PLAN - await labs 02/09/2023 - do HRCT at [facility] - next few weeks - 3 months do spiroemtry and dlco  Followup - will call with CT results I next few weeks -3 months - 30 min visit; but after spiro/dlco   ILD symptoms score at followup  Can cancel if in research  (Level 04: Estb 30-39 min   visit type: on-site physical face to visit visit spent in total care time and counseling or/and coordination of care by this undersigned MD - Dr Brand Males. This includes one or more of the following on this same day 02/09/2023: pre-charting, chart review, note writing, documentation discussion of test results, diagnostic or treatment recommendations, prognosis, risks and benefits of management options, instructions, education, compliance or risk-factor reduction. It excludes time spent by the [place] or office staff in the care of the patient . Actual time is 30 min)   SIGNATURE    Dr. Brand Males, M.D., F.C.C.P,  Pulmonary and Critical Care Medicine Staff Physician, [place] Director - Interstitial Lung Disease  Program  [facility] [place],  [place], [address]  Pager: [phone], If no answer or between  15:00h - 7:00h: call [phone] Telephone: [phone]  5:00 PM 02/09/2023

## 2023-02-22 ENCOUNTER — Telehealth: Payer: Self-pay | Admitting: Internal Medicine

## 2023-02-22 NOTE — Telephone Encounter (Signed)
Pt wife aware of new appt date and time

## 2023-02-22 NOTE — Telephone Encounter (Signed)
PT wife calling. States PT can not do a CT on the 22nd (sick)  and when we resched states she'd like it done at [facility]. TY.   Pls call wife @ [phone]

## 2023-02-24 ENCOUNTER — Ambulatory Visit (HOSPITAL_COMMUNITY): Payer: Medicare Other

## 2023-03-07 ENCOUNTER — Other Ambulatory Visit: Payer: Self-pay | Admitting: Orthopedic Surgery

## 2023-03-16 ENCOUNTER — Ambulatory Visit (HOSPITAL_BASED_OUTPATIENT_CLINIC_OR_DEPARTMENT_OTHER)
Admission: RE | Admit: 2023-03-16 | Discharge: 2023-03-16 | Disposition: A | Discharge status: Home / Self Care | Payer: Medicare Other | Source: Ambulatory Visit | Attending: Internal Medicine | Admitting: Internal Medicine

## 2023-03-16 DIAGNOSIS — J84112 Idiopathic pulmonary fibrosis: Secondary | ICD-10-CM | POA: Insufficient documentation

## 2023-03-22 ENCOUNTER — Telehealth: Payer: Self-pay | Admitting: Internal Medicine

## 2023-03-22 NOTE — Telephone Encounter (Signed)
Patient's wife called to ask if the patient's PCP, can get a copy of the patient's CT scan.  Please advise.  CB# [phone]

## 2023-03-22 NOTE — Telephone Encounter (Signed)
Called and left voicemail for patient to call office back in regards to CT scan

## 2023-05-03 ENCOUNTER — Telehealth: Payer: Self-pay | Admitting: Internal Medicine

## 2023-05-03 NOTE — Telephone Encounter (Signed)
Patient's wife called to ask if the patient still has to have his upcoming breathing test since he had the CT.  Please advise and call patient's wife Elease Hashimoto) to discuss further.  CB# [phone]

## 2023-05-05 NOTE — Telephone Encounter (Signed)
Dr. Marchelle Gearing please advise on this if you still are wanting pt to have the PFT.

## 2023-05-10 NOTE — Telephone Encounter (Signed)
April 2024 CT showed mild progression . NExt appt with PFT is in aug 2024.  I think it will be nice to have a spiro/dlco when he sees me in August but if is a challenge for him to do it then he can cancel it      Latest Ref Rng & Units 09/23/2022   12:29 PM 09/21/2018   12:08 PM 05/23/2018   10:37 AM 12/25/2017    9:01 AM 10/04/2017    9:35 AM 04/06/2017   10:13 AM 10/14/2016    8:19 AM  PFT Results  FVC-Pre L 2.27   2.89  P 2.88  2.94  2.94  3.00   FVC-Predicted Pre % 52   61  P 61  62  62  63   FVC-Post L       3.00   FVC-Predicted Post %       63   Pre FEV1/FVC % % 89   91  P 86  92  91  90   Post FEV1/FCV % %       92   FEV1-Pre L 2.01   2.62  P 2.47  2.69  2.69  2.70   FEV1-Predicted Pre % 65   77  P 72  79  78  78   FEV1-Post L       2.75   DLCO uncorrected ml/min/mmHg 11.84  14.95  17.13  P 14.56  15.04  16.43  16.82   DLCO UNC% % 45  42  47  P 40  41  45  46   DLCO corrected ml/min/mmHg 11.84  15.26  17.33  P 15.57  15.31  19.35    DLCO COR %Predicted % 45  43  47  P 42  42  53    DLVA Predicted % 97  83  91  P 83  79  99  81   TLC L       4.62   TLC % Predicted %       60   RV % Predicted %       60     P Preliminary result       Narrative & Impression  CLINICAL DATA:  IPF   EXAM: CT CHEST WITHOUT CONTRAST   TECHNIQUE: Multidetector CT imaging of the chest was performed following the standard protocol without intravenous contrast. High resolution imaging of the lungs, as well as inspiratory and expiratory imaging, was performed.   RADIATION DOSE REDUCTION: This exam was performed according to the departmental dose-optimization program which includes automated exposure control, adjustment of the mA and/or kV according to patient size and/or use of iterative reconstruction technique.   COMPARISON:  Chest CT dated December 28, 2020; chest CT dated October 02, 2018   FINDINGS: Cardiovascular: Cardiomegaly. No pericardial effusion. Aortic  valve calcifications. Ascending thoracic aorta is limits of normal in size measuring up to 3.9 cm dilated main pulmonary artery, measuring up to 4.5 cm. Severe coronary artery calcifications status post CABG. 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   Mediastinum/Nodes: Esophagus and thyroid are unremarkable. Mediastinal lymph nodes are slightly increased in size when compared with the prior exam. Enlarged subcarinal lymph node measuring 1.5 cm in short axis on series 2, image 17, previously 1.2 cm.   Lungs/Pleura: Central airways are patent. Mosaic attenuation and air trapping. Peribronchovascular predominant reticular opacities with associated traction bronchiectasis and slight upper lobe predominant distribution. No evidence of honeycomb change. Probable slight interval progression  when compared with most recent prior exam, definite progression when compared with October 02, 2018 prior. Postsurgical changes of the left hemithorax. No pleural effusion.   Upper Abdomen: No acute abnormality.   Musculoskeletal: No chest wall mass or suspicious bone lesions identified.   IMPRESSION: 1. Fibrotic interstitial lung disease. Probable slight progression when compared with most recent prior exam, definite progression when compared with October 02, 2018 prior. Fibrotic HP is a consideration given associated mosaic attenuation and air trapping. Findings are suggestive of an alternative diagnosis (not UIP) per consensus guidelines: Diagnosis of Idiopathic Pulmonary Fibrosis: An Official ATS/ERS/JRS/ALAT Clinical Practice Guideline. Am Rosezetta Schlatter Crit Care Med Vol 198, Iss 5, [phone], Aug 05 2017. 2. Mediastinal lymph nodes are slightly increased in size when compared with the prior exam, likely reactive. 3. Dilated main pulmonary artery, findings can be seen in the setting of pulmonary hypertension. 4. Aortic valve calcifications, findings can be seen in the setting of aortic stenosis.  Correlate with echocardiography. 5. Aortic Atherosclerosis (ICD10-I70.0).     Electronically Signed   By: Allegra Lai M.D.   On: 03/20/2023 18:56

## 2023-05-11 NOTE — Telephone Encounter (Signed)
ATC x1.  LVM to return call. 

## 2023-05-18 ENCOUNTER — Ambulatory Visit: Payer: Medicare Other | Admitting: Internal Medicine

## 2023-06-21 ENCOUNTER — Other Ambulatory Visit: Payer: Self-pay

## 2023-06-21 ENCOUNTER — Observation Stay (HOSPITAL_COMMUNITY): Payer: Medicare Other

## 2023-06-21 ENCOUNTER — Encounter (HOSPITAL_COMMUNITY): Payer: Self-pay | Admitting: Emergency Medicine

## 2023-06-21 ENCOUNTER — Emergency Department (HOSPITAL_COMMUNITY): Payer: Medicare Other

## 2023-06-21 ENCOUNTER — Inpatient Hospital Stay (HOSPITAL_COMMUNITY)
Admission: EM | Admit: 2023-06-21 | Discharge: 2023-06-24 | DRG: 291 | Disposition: A | Discharge status: Home Health | Payer: Medicare Other | Attending: Internal Medicine | Admitting: Internal Medicine

## 2023-06-21 DIAGNOSIS — I459 Conduction disorder, unspecified: Secondary | ICD-10-CM | POA: Diagnosis present

## 2023-06-21 DIAGNOSIS — Z885 Allergy status to narcotic agent status: Secondary | ICD-10-CM

## 2023-06-21 DIAGNOSIS — Z7902 Long term (current) use of antithrombotics/antiplatelets: Secondary | ICD-10-CM

## 2023-06-21 DIAGNOSIS — Z9841 Cataract extraction status, right eye: Secondary | ICD-10-CM

## 2023-06-21 DIAGNOSIS — I482 Chronic atrial fibrillation, unspecified: Secondary | ICD-10-CM | POA: Diagnosis present

## 2023-06-21 DIAGNOSIS — Z8616 Personal history of COVID-19: Secondary | ICD-10-CM

## 2023-06-21 DIAGNOSIS — Z959 Presence of cardiac and vascular implant and graft, unspecified: Secondary | ICD-10-CM | POA: Diagnosis not present

## 2023-06-21 DIAGNOSIS — I5043 Acute on chronic combined systolic (congestive) and diastolic (congestive) heart failure: Secondary | ICD-10-CM | POA: Diagnosis present

## 2023-06-21 DIAGNOSIS — Z91199 Patient's noncompliance with other medical treatment and regimen due to unspecified reason: Secondary | ICD-10-CM

## 2023-06-21 DIAGNOSIS — N1831 Chronic kidney disease, stage 3a: Secondary | ICD-10-CM | POA: Diagnosis not present

## 2023-06-21 DIAGNOSIS — I130 Hypertensive heart and chronic kidney disease with heart failure and stage 1 through stage 4 chronic kidney disease, or unspecified chronic kidney disease: Secondary | ICD-10-CM | POA: Diagnosis not present

## 2023-06-21 DIAGNOSIS — Z981 Arthrodesis status: Secondary | ICD-10-CM

## 2023-06-21 DIAGNOSIS — I10 Essential (primary) hypertension: Secondary | ICD-10-CM | POA: Diagnosis present

## 2023-06-21 DIAGNOSIS — E1151 Type 2 diabetes mellitus with diabetic peripheral angiopathy without gangrene: Secondary | ICD-10-CM | POA: Diagnosis present

## 2023-06-21 DIAGNOSIS — Z961 Presence of intraocular lens: Secondary | ICD-10-CM | POA: Diagnosis present

## 2023-06-21 DIAGNOSIS — I442 Atrioventricular block, complete: Secondary | ICD-10-CM | POA: Diagnosis present

## 2023-06-21 DIAGNOSIS — Z87442 Personal history of urinary calculi: Secondary | ICD-10-CM

## 2023-06-21 DIAGNOSIS — Z951 Presence of aortocoronary bypass graft: Secondary | ICD-10-CM

## 2023-06-21 DIAGNOSIS — Z66 Do not resuscitate: Secondary | ICD-10-CM | POA: Diagnosis present

## 2023-06-21 DIAGNOSIS — K219 Gastro-esophageal reflux disease without esophagitis: Secondary | ICD-10-CM | POA: Diagnosis present

## 2023-06-21 DIAGNOSIS — E669 Obesity, unspecified: Secondary | ICD-10-CM | POA: Diagnosis present

## 2023-06-21 DIAGNOSIS — Z833 Family history of diabetes mellitus: Secondary | ICD-10-CM

## 2023-06-21 DIAGNOSIS — R0602 Shortness of breath: Secondary | ICD-10-CM | POA: Diagnosis not present

## 2023-06-21 DIAGNOSIS — I350 Nonrheumatic aortic (valve) stenosis: Secondary | ICD-10-CM | POA: Diagnosis present

## 2023-06-21 DIAGNOSIS — G4733 Obstructive sleep apnea (adult) (pediatric): Secondary | ICD-10-CM

## 2023-06-21 DIAGNOSIS — I739 Peripheral vascular disease, unspecified: Secondary | ICD-10-CM | POA: Diagnosis present

## 2023-06-21 DIAGNOSIS — E785 Hyperlipidemia, unspecified: Secondary | ICD-10-CM | POA: Diagnosis present

## 2023-06-21 DIAGNOSIS — Z825 Family history of asthma and other chronic lower respiratory diseases: Secondary | ICD-10-CM

## 2023-06-21 DIAGNOSIS — Z89511 Acquired absence of right leg below knee: Secondary | ICD-10-CM

## 2023-06-21 DIAGNOSIS — E1122 Type 2 diabetes mellitus with diabetic chronic kidney disease: Secondary | ICD-10-CM | POA: Diagnosis present

## 2023-06-21 DIAGNOSIS — I2510 Atherosclerotic heart disease of native coronary artery without angina pectoris: Secondary | ICD-10-CM | POA: Diagnosis present

## 2023-06-21 DIAGNOSIS — N183 Chronic kidney disease, stage 3 unspecified: Secondary | ICD-10-CM | POA: Diagnosis present

## 2023-06-21 DIAGNOSIS — Z89512 Acquired absence of left leg below knee: Secondary | ICD-10-CM

## 2023-06-21 DIAGNOSIS — Z7901 Long term (current) use of anticoagulants: Secondary | ICD-10-CM

## 2023-06-21 DIAGNOSIS — J84112 Idiopathic pulmonary fibrosis: Secondary | ICD-10-CM | POA: Diagnosis present

## 2023-06-21 DIAGNOSIS — Z794 Long term (current) use of insulin: Secondary | ICD-10-CM

## 2023-06-21 DIAGNOSIS — Z79899 Other long term (current) drug therapy: Secondary | ICD-10-CM

## 2023-06-21 DIAGNOSIS — I495 Sick sinus syndrome: Secondary | ICD-10-CM | POA: Diagnosis present

## 2023-06-21 DIAGNOSIS — Z6832 Body mass index (BMI) 32.0-32.9, adult: Secondary | ICD-10-CM

## 2023-06-21 DIAGNOSIS — E1165 Type 2 diabetes mellitus with hyperglycemia: Secondary | ICD-10-CM | POA: Diagnosis present

## 2023-06-21 DIAGNOSIS — F32A Depression, unspecified: Secondary | ICD-10-CM | POA: Diagnosis present

## 2023-06-21 DIAGNOSIS — I5023 Acute on chronic systolic (congestive) heart failure: Secondary | ICD-10-CM | POA: Diagnosis not present

## 2023-06-21 DIAGNOSIS — Z8249 Family history of ischemic heart disease and other diseases of the circulatory system: Secondary | ICD-10-CM

## 2023-06-21 DIAGNOSIS — Z950 Presence of cardiac pacemaker: Secondary | ICD-10-CM

## 2023-06-21 DIAGNOSIS — F419 Anxiety disorder, unspecified: Secondary | ICD-10-CM | POA: Diagnosis present

## 2023-06-21 DIAGNOSIS — Z888 Allergy status to other drugs, medicaments and biological substances status: Secondary | ICD-10-CM

## 2023-06-21 DIAGNOSIS — E113593 Type 2 diabetes mellitus with proliferative diabetic retinopathy without macular edema, bilateral: Secondary | ICD-10-CM | POA: Diagnosis present

## 2023-06-21 DIAGNOSIS — Z9842 Cataract extraction status, left eye: Secondary | ICD-10-CM

## 2023-06-21 HISTORY — DX: Acute posthemorrhagic anemia: D62

## 2023-06-21 LAB — COMPREHENSIVE METABOLIC PANEL
ALT: 21 U/L (ref 0–44)
AST: 19 U/L (ref 15–41)
Albumin: 3.1 g/dL — ABNORMAL LOW (ref 3.5–5.0)
Alkaline Phosphatase: 121 U/L (ref 38–126)
Anion gap: 12 (ref 5–15)
BUN: 40 mg/dL — ABNORMAL HIGH (ref 8–23)
CO2: 25 mmol/L (ref 22–32)
Calcium: 8.8 mg/dL — ABNORMAL LOW (ref 8.9–10.3)
Chloride: 97 mmol/L — ABNORMAL LOW (ref 98–111)
Creatinine, Ser: 1.59 mg/dL — ABNORMAL HIGH (ref 0.61–1.24)
GFR, Estimated: 43 mL/min — ABNORMAL LOW (ref >60)
Glucose, Bld: 145 mg/dL — ABNORMAL HIGH (ref 70–99)
Potassium: 3.8 mmol/L (ref 3.5–5.1)
Sodium: 134 mmol/L — ABNORMAL LOW (ref 135–145)
Total Bilirubin: 0.6 mg/dL (ref 0.3–1.2)
Total Protein: 7 g/dL (ref 6.5–8.1)

## 2023-06-21 LAB — CBC WITH DIFFERENTIAL/PLATELET
Abs Immature Granulocytes: 0.02 10*3/uL (ref 0.00–0.07)
Basophils Absolute: 0 10*3/uL (ref 0.0–0.1)
Basophils Relative: 1 %
Eosinophils Absolute: 0.6 10*3/uL — ABNORMAL HIGH (ref 0.0–0.5)
Eosinophils Relative: 9 %
HCT: 38.1 % — ABNORMAL LOW (ref 39.0–52.0)
Hemoglobin: 11.6 g/dL — ABNORMAL LOW (ref 13.0–17.0)
Immature Granulocytes: 0 %
Lymphocytes Relative: 18 %
Lymphs Abs: 1.1 10*3/uL (ref 0.7–4.0)
MCH: 24.8 pg — ABNORMAL LOW (ref 26.0–34.0)
MCHC: 30.4 g/dL (ref 30.0–36.0)
MCV: 81.6 fL (ref 80.0–100.0)
Monocytes Absolute: 0.6 10*3/uL (ref 0.1–1.0)
Monocytes Relative: 11 %
Neutro Abs: 3.7 10*3/uL (ref 1.7–7.7)
Neutrophils Relative %: 61 %
Platelets: 185 10*3/uL (ref 150–400)
RBC: 4.67 MIL/uL (ref 4.22–5.81)
RDW: 17.2 % — ABNORMAL HIGH (ref 11.5–15.5)
WBC: 6 10*3/uL (ref 4.0–10.5)
nRBC: 0 % (ref 0.0–0.2)

## 2023-06-21 LAB — TROPONIN I (HIGH SENSITIVITY)
Troponin I (High Sensitivity): 40 ng/L — ABNORMAL HIGH (ref <18)
Troponin I (High Sensitivity): 41 ng/L — ABNORMAL HIGH (ref <18)

## 2023-06-21 LAB — MAGNESIUM: Magnesium: 1.9 mg/dL (ref 1.7–2.4)

## 2023-06-21 LAB — GLUCOSE, CAPILLARY: Glucose-Capillary: 179 mg/dL — ABNORMAL HIGH (ref 70–99)

## 2023-06-21 LAB — BRAIN NATRIURETIC PEPTIDE: B Natriuretic Peptide: 248.7 pg/mL — ABNORMAL HIGH (ref 0.0–100.0)

## 2023-06-21 LAB — SARS CORONAVIRUS 2 BY RT PCR: SARS Coronavirus 2 by RT PCR: NEGATIVE

## 2023-06-21 MED ORDER — SODIUM CHLORIDE 0.9% FLUSH
3.0000 mL | Freq: Two times a day (BID) | INTRAVENOUS | Status: DC
  Administered 2023-06-21 – 2023-06-24 (×5): 3 mL via INTRAVENOUS

## 2023-06-21 MED ORDER — TRAZODONE HCL 50 MG PO TABS
75.0000 mg | ORAL_TABLET | Freq: Every day | ORAL | Status: DC
  Administered 2023-06-21 – 2023-06-23 (×3): 75 mg via ORAL
  Filled 2023-06-21 (×3): qty 2

## 2023-06-21 MED ORDER — GABAPENTIN 300 MG PO CAPS
300.0000 mg | ORAL_CAPSULE | Freq: Every day | ORAL | Status: DC
  Administered 2023-06-21 – 2023-06-23 (×3): 300 mg via ORAL
  Filled 2023-06-21 (×3): qty 1

## 2023-06-21 MED ORDER — FUROSEMIDE 10 MG/ML IJ SOLN
60.0000 mg | Freq: Once | INTRAMUSCULAR | Status: AC
  Administered 2023-06-21: 60 mg via INTRAVENOUS
  Filled 2023-06-21: qty 6

## 2023-06-21 MED ORDER — PANTOPRAZOLE SODIUM 40 MG PO TBEC
40.0000 mg | DELAYED_RELEASE_TABLET | Freq: Two times a day (BID) | ORAL | Status: DC
  Administered 2023-06-21 – 2023-06-24 (×6): 40 mg via ORAL
  Filled 2023-06-21 (×6): qty 1

## 2023-06-21 MED ORDER — ALBUTEROL SULFATE (2.5 MG/3ML) 0.083% IN NEBU
2.5000 mg | INHALATION_SOLUTION | RESPIRATORY_TRACT | Status: DC | PRN
  Administered 2023-06-21 – 2023-06-24 (×6): 2.5 mg via RESPIRATORY_TRACT
  Filled 2023-06-21 (×6): qty 3

## 2023-06-21 MED ORDER — ATORVASTATIN CALCIUM 80 MG PO TABS
80.0000 mg | ORAL_TABLET | Freq: Every day | ORAL | Status: DC
  Administered 2023-06-22 – 2023-06-24 (×3): 80 mg via ORAL
  Filled 2023-06-21 (×3): qty 1

## 2023-06-21 MED ORDER — INSULIN ASPART 100 UNIT/ML IJ SOLN
0.0000 [IU] | Freq: Three times a day (TID) | INTRAMUSCULAR | Status: DC
  Administered 2023-06-22: 2 [IU] via SUBCUTANEOUS
  Administered 2023-06-22: 3 [IU] via SUBCUTANEOUS
  Administered 2023-06-22: 8 [IU] via SUBCUTANEOUS
  Administered 2023-06-23: 5 [IU] via SUBCUTANEOUS
  Administered 2023-06-23 – 2023-06-24 (×3): 3 [IU] via SUBCUTANEOUS
  Administered 2023-06-24: 5 [IU] via SUBCUTANEOUS

## 2023-06-21 MED ORDER — ESCITALOPRAM OXALATE 10 MG PO TABS
10.0000 mg | ORAL_TABLET | Freq: Every day | ORAL | Status: DC
  Administered 2023-06-22 – 2023-06-24 (×3): 10 mg via ORAL
  Filled 2023-06-21 (×3): qty 1

## 2023-06-21 MED ORDER — CLOPIDOGREL BISULFATE 75 MG PO TABS
75.0000 mg | ORAL_TABLET | Freq: Every day | ORAL | Status: DC
  Administered 2023-06-22 – 2023-06-24 (×3): 75 mg via ORAL
  Filled 2023-06-21 (×3): qty 1

## 2023-06-21 MED ORDER — METOPROLOL TARTRATE 25 MG PO TABS
25.0000 mg | ORAL_TABLET | Freq: Every day | ORAL | Status: DC
  Administered 2023-06-22 – 2023-06-24 (×3): 25 mg via ORAL
  Filled 2023-06-21 (×3): qty 1

## 2023-06-21 MED ORDER — APIXABAN 5 MG PO TABS
5.0000 mg | ORAL_TABLET | Freq: Two times a day (BID) | ORAL | Status: DC
  Administered 2023-06-21 – 2023-06-24 (×6): 5 mg via ORAL
  Filled 2023-06-21 (×6): qty 1

## 2023-06-21 MED ORDER — FUROSEMIDE 10 MG/ML IJ SOLN
60.0000 mg | Freq: Two times a day (BID) | INTRAMUSCULAR | Status: DC
  Administered 2023-06-22 (×2): 60 mg via INTRAVENOUS
  Filled 2023-06-21 (×3): qty 6

## 2023-06-21 MED ORDER — POLYETHYLENE GLYCOL 3350 17 G PO PACK: 17.0000 g | PACK | Freq: Every day | ORAL | Status: DC | PRN

## 2023-06-21 MED ORDER — ACETAMINOPHEN 650 MG RE SUPP: 650.0000 mg | Freq: Four times a day (QID) | RECTAL | Status: DC | PRN

## 2023-06-21 MED ORDER — ACETAMINOPHEN 325 MG PO TABS: 650.0000 mg | ORAL_TABLET | Freq: Four times a day (QID) | ORAL | Status: DC | PRN

## 2023-06-21 MED ORDER — INSULIN GLARGINE-YFGN 100 UNIT/ML ~~LOC~~ SOLN
10.0000 [IU] | Freq: Every day | SUBCUTANEOUS | Status: DC
  Administered 2023-06-22 – 2023-06-24 (×3): 10 [IU] via SUBCUTANEOUS
  Filled 2023-06-21 (×3): qty 0.1

## 2023-06-21 NOTE — ED Triage Notes (Signed)
Pt BIB [facility] from doctor's office due to shortness of breath.  Pt has had increased weakness and edema in extremities.  PCP tried to give lasix for 3 weeks to bring down fluid but still SHOB.  VS BP 141/78, Pulse 62, SpO2 97%RA

## 2023-06-21 NOTE — ED Notes (Signed)
Patient transported to X-ray 

## 2023-06-21 NOTE — ED Notes (Signed)
ED TO INPATIENT HANDOFF REPORT  ED Nurse Name and Phone #: Rodney Booze [phone]  S Name/Age/Gender Shane [name] 82 y.o. male Room/Bed: 042C/042C  Code Status   Code Status: DNR  Home/SNF/Other Home Patient oriented to: self, place, time, and situation Is this baseline? Yes   Triage Complete: Triage complete  Chief Complaint Acute on chronic systolic (congestive) heart failure (HCC) [I50.23]  Triage Note Pt BIB [facility] from doctor's office due to shortness of breath.  Pt has had increased weakness and edema in extremities.  PCP tried to give lasix for 3 weeks to bring down fluid but still SHOB.  VS BP 141/78, Pulse 62, SpO2 97%RA   Allergies Allergies  Allergen Reactions   Codeine Hives and Itching   Ofev [Nintedanib] Diarrhea    SEVERE DIARRHEA   Pirfenidone Diarrhea and Other (See Comments)    Esbriet (Pirfenidone) causes elevated LFTs. 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 10/2019 Echo: EF 55-60%, Gr2 DD. Nl RV.    Neuromuscular disorder (HCC)    Neuropathy associated with endocrine disorder (HCC)    Nonobstructive CAD (coronary artery disease)    a. 2012 Cath: mod, nonobs dzs; b. 10/2016 MV: EF 60%, no ischemia.   OSA on CPAP 05/05/2017   Unattended Home Sleep Test 7/2/813-AHI 38.6/hour, desaturation to 64%, body weight 261 pounds   PONV (postoperative nausea and vomiting)    Postoperative anemia due to acute blood loss 11/07/2020   Postoperative hemorrhagic shock 03/20/2021   Sleep apnea     uses cpap asked to bring mask and tubing   Past Surgical History:  Procedure Laterality Date   ABDOMINAL AORTOGRAM W/LOWER EXTREMITY N/A 12/10/2020   Procedure: ABDOMINAL AORTOGRAM W/LOWER EXTREMITY;  Surgeon: Leonie Douglas, MD;  [facility] INVASIVE CV LAB;  Service: Cardiovascular;  Laterality: N/A;   AMPUTATION Right  01/22/2021   Procedure: 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;  Surgeon: [name], MD;  Location[facility];  Service: Orthopedics;  Laterality: Right;   AMPUTATION Right 03/17/2021   Procedure: RIGHT BELOW KNEE AMPUTATION;  Surgeon: [name], MD;  [facility];  Service: Orthopedics;  Laterality: Right;   ANKLE FUSION Right 01/22/2021   Procedure: RIGHT FOOT TIBIOCALCANEAL FUSION;  Surgeon: [name], MD;  Location: [facility] OR;  Service: Orthopedics;  Laterality: Right;   ANTERIOR FUSION CERVICAL SPINE  2012   CARDIAC CATHETERIZATION  2011   CARDIAC CATHETERIZATION N/A 11/09/2016   Procedure: Right Heart  Cath;  Surgeon: Lyn Records, MD;  Location: [facility] INVASIVE CV LAB;  Service: Cardiovascular;  Laterality: N/A;   carpel tunnel     left wrist   CATARACT EXTRACTION     CATARACT EXTRACTION W/ INTRAOCULAR LENS  IMPLANT, BILATERAL  2013   CERVICAL LAMINECTOMY  2012   COLONOSCOPY N/A 01/14/2013   Procedure: COLONOSCOPY;  Surgeon: Hilarie Fredrickson, MD;  Location: [facility] ENDOSCOPY;  Service: Endoscopy;  Laterality: N/A;   CORONARY ARTERY BYPASS GRAFT  11/04/2020   LIMA-LAD, SVG-OM1, SVG-PDA (Dr Rochele Pages [facility]) dc 11/18/2020   EYE SURGERY     I & D EXTREMITY Right 02/19/2021   Procedure: RIGHT ANKLE DEBRIDEMENT AND PLACEMENT ANTIBIOTIC BEADS;  Surgeon: [name], MD;  Location: [facility] OR;  Service: Orthopedics;  Laterality: Right;   KNEE SURGERY  1998   left   LEFT HEART CATH AND CORONARY ANGIOGRAPHY N/A 07/10/2020   Procedure: LEFT HEART CATH AND CORONARY ANGIOGRAPHY;  Surgeon: Tonny Bollman, MD;  Location[facility] INVASIVE CV LAB;  Service: Cardiovascular;  Laterality: N/A;   LUMBAR LAMINECTOMY  2003   LUNG BIOPSY Left 12/26/2016   Procedure: LUNG BIOPSY;  Surgeon: Loreli Slot, MD;  Location: [facility] OR;  Service: Thoracic;  Laterality: Left;   PACEMAKER IMPLANT N/A 03/30/2020   Procedure: PACEMAKER IMPLANT;  Surgeon: Marinus Maw, MD;  Location: [facility] INVASIVE CV LAB;  Service: Cardiovascular;  Laterality: N/A;   PERIPHERAL VASCULAR INTERVENTION Right 12/10/2020   Procedure: PERIPHERAL VASCULAR INTERVENTION;  Surgeon: Leonie Douglas, MD;  Location: [facility]VAS[facility] LAB;  Service: Cardiovascular;  Laterality: Right;  SFA   POSTERIOR FUSION CERVICAL SPINE  2012   PPM GENERATOR REMOVAL N/A 12/14/2020   Procedure: PPM GENERATOR REMOVAL;  Surgeon: Marinus Maw, MD;  Location: [facility] INVASIVE CV LAB;  Service: Cardiovascular;  Laterality: N/A;   TEE WITHOUT CARDIOVERSION N/A 12/11/2020   Procedure: TRANSESOPHAGEAL ECHOCARDIOGRAM (TEE);  Surgeon: Sande Rives, MD;  Location: [facility] ENDOSCOPY;  Service:  Cardiovascular;  Laterality: N/A;   TRIGGER FINGER RELEASE  2011   4th finger left hand   VIDEO ASSISTED THORACOSCOPY Left 12/26/2016   Procedure: VIDEO ASSISTED THORACOSCOPY;  Surgeon: Loreli Slot, MD;  Location: [facility] OR;  Service: Thoracic;  Laterality: Left;   VIDEO BRONCHOSCOPY N/A 12/26/2016   Procedure: VIDEO BRONCHOSCOPY;  Surgeon: Loreli Slot, MD;  Location: [facility] OR;  Service: Thoracic;  Laterality: N/A;     A IV Location/Drains/Wounds Patient Lines/Drains/Airways Status     Active Line/Drains/Airways     Name Placement date Placement time Site Days   Peripheral IV 06/21/23 20 G Left Antecubital 06/21/23  1017  Antecubital  less than 1   Pressure Injury 03/26/21 Toe (Comment  which one) Anterior;Left Unstageable - Full thickness tissue loss in which the base of the injury is covered by slough (yellow, tan, gray, green or  brown) and/or eschar (tan, brown or black) in the wound bed. pressu 03/26/21  1423  -- 817   Wound / Incision (Open or Dehisced) 03/26/21 Other (Comment) Pretibial Proximal;Right Amputation incision  closed with staples 03/26/21  1427  Pretibial  817            Intake/Output Last 24 hours  Intake/Output Summary (Last 24 hours) at 06/21/2023 1828 Last data filed at 06/21/2023 1248 Gross per 24 hour  Intake --  Output 1300 ml  Net -1300 ml    Labs/Imaging Results for orders placed or performed during the hospital encounter of 06/21/23 (from the past 48 hour(s))  Comprehensive metabolic panel     Status: Abnormal   Collection Time: 06/21/23 10:27 AM  Result Value Ref Range   Sodium 134 (L) 135 - 145 mmol/L   Potassium 3.8 3.5 - 5.1 mmol/L   Chloride 97 (L) 98 - 111 mmol/L   CO2 25 22 - 32 mmol/L   Glucose, Bld 145 (H) 70 - 99 mg/dL    Comment: Glucose reference range applies only to samples taken after fasting for at least 8 hours.   BUN 40 (H) 8 - 23 mg/dL   Creatinine, Ser 2.95 (H) 0.61 - 1.24 mg/dL   Calcium 8.8 (L) 8.9 - 10.3 mg/dL    Total Protein 7.0 6.5 - 8.1 g/dL   Albumin 3.1 (L) 3.5 - 5.0 g/dL   AST 19 15 - 41 U/L   ALT 21 0 - 44 U/L   Alkaline Phosphatase 121 38 - 126 U/L   Total Bilirubin 0.6 0.3 - 1.2 mg/dL   GFR, Estimated 43 (L) >60 mL/min    Comment: (NOTE) Calculated using the CKD-EPI Creatinine Equation (2021)    Anion gap 12 5 - 15    Comment: Performed at [facility] Lab, [address] [place], [place] [address]  Brain natriuretic peptide     Status: Abnormal   Collection Time: 06/21/23 10:27 AM  Result Value Ref Range   B Natriuretic Peptide 248.7 (H) 0.0 - 100.0 pg/mL    Comment: Performed at [facility] Lab, [address], [place], [place] [address]  Troponin I (High Sensitivity)     Status: Abnormal   Collection Time: 06/21/23 10:27 AM  Result Value Ref Range   Troponin I (High Sensitivity) 40 (H) <18 ng/L    Comment: (NOTE) Elevated high sensitivity troponin I (hsTnI) values and significant  changes across serial measurements may suggest ACS but many other  chronic and acute conditions are known to elevate hsTnI results.  Refer to the "Links" section for chest pain algorithms and additional  guidance. Performed at [facility] Lab, [address], [place], [place] [address]   CBC with Differential     Status: Abnormal   Collection Time: 06/21/23 10:27 AM  Result Value Ref Range   WBC 6.0 4.0 - 10.5 K/uL   RBC 4.67 4.22 - 5.81 MIL/uL   Hemoglobin 11.6 (L) 13.0 - 17.0 g/dL   HCT 95.2 (L) 84.1 - 32.4 %   MCV 81.6 80.0 - 100.0 fL   MCH 24.8 (L) 26.0 - 34.0 pg   MCHC 30.4 30.0 - 36.0 g/dL   RDW 40.1 (H) 02.7 - 25.3 %   Platelets 185 150 - 400 K/uL   nRBC 0.0 0.0 - 0.2 %   Neutrophils Relative % 61 %   Neutro Abs 3.7 1.7 - 7.7 K/uL   Lymphocytes Relative 18 %   Lymphs  Abs 1.1 0.7 - 4.0 K/uL   Monocytes Relative 11 %   Monocytes Absolute 0.6 0.1 - 1.0 K/uL   Eosinophils Relative 9 %   Eosinophils Absolute 0.6 (H) 0.0 - 0.5 K/uL   Basophils Relative 1 %   Basophils  Absolute 0.0 0.0 - 0.1 K/uL   Immature Granulocytes 0 %   Abs Immature Granulocytes 0.02 0.00 - 0.07 K/uL    Comment: Performed at [facility], [address], [place], [place] [address]  SARS Coronavirus 2 by RT PCR (hospital order, performed in [facility] hospital lab) *cepheid single result test* Anterior Nasal Swab     Status: None   Collection Time: 06/21/23  1:19 PM   Specimen: Anterior Nasal Swab  Result Value Ref Range   SARS Coronavirus 2 by RT PCR NEGATIVE NEGATIVE    Comment: Performed at [facility] Lab, [address], [place], [place] [address]  Troponin I (High Sensitivity)     Status: Abnormal   Collection Time: 06/21/23  1:30 PM  Result Value Ref Range   Troponin I (High Sensitivity) 41 (H) <18 ng/L    Comment: (NOTE) Elevated high sensitivity troponin I (hsTnI) values and significant  changes across serial measurements may suggest ACS but many other  chronic and acute conditions are known to elevate hsTnI results.  Refer to the "Links" section for chest pain algorithms and additional  guidance. Performed at [facility] Lab, [address], [place], [place] [address]   Magnesium     Status: None   Collection Time: 06/21/23  1:30 PM  Result Value Ref Range   Magnesium 1.9 1.7 - 2.4 mg/dL    Comment: Performed at [name] Lab, [address] [place], [place] [address]   *Note: Due to a large number of results and/or encounters for the requested time period, some results have not been displayed. A complete set of results can be found in Results Review.   DG Foot Complete Left  Result Date: 06/21/2023 CLINICAL DATA:  r/o osteomyelitis EXAM: LEFT FOOT - COMPLETE 3+ VIEW COMPARISON:  Radiograph from 04/23/2021 FINDINGS: Bone mineralization within normal limits for patient's age. 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 The resection margins are sharp. No focal bone erosions. No evidence of air within the overlying soft tissue  stump. No radiographic evidence of acute osteomyelitis. Correlate clinically to determine the need for additional imaging with more sensitive modality such as MRI. No acute fracture or dislocation. No aggressive osseous lesion. Mild degenerative osteoarthritis of imaged joints. Calcaneal spur noted along the Achilles tendon and Plantar aponeurosis attachment sites. No focal soft tissue swelling. No radiopaque foreign bodies. 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 No radiographic evidence of acute osteomyelitis. Electronically Signed   By: Jules Schick M.D.   On: 06/21/2023 11:22   DG Chest 2 View  Result Date: 06/21/2023 CLINICAL DATA:  Shortness of breath EXAM: CHEST - 2 VIEW COMPARISON:  Chest radiograph dated 03/24/2021 FINDINGS: Low lung volumes with bronchovascular crowding. Bilateral interstitial opacities. Blunting of the bilateral costophrenic angles. No pneumothorax. Similar enlarged cardiomediastinal silhouette. Cervical spinal fixation hardware appears intact. Median sternotomy wires are nondisplaced. IMPRESSION: 1.  Low lung volumes with bronchovascular crowding. 2. Similar bilateral chronic-appearing interstitial opacities and reticulations. 3. Similar cardiomegaly. Electronically Signed   By: Agustin Cree M.D.   On: 06/21/2023 11:19    Pending Labs [employer] (From admission, onward)     Start  Ordered   06/22/23 0500  Comprehensive metabolic panel  Tomorrow morning,   R        06/21/23 1230   06/22/23 0500  CBC  Tomorrow morning,   R        06/21/23 1230            Vitals/Pain Today's Vitals   06/21/23 1016 06/21/23 1030 06/21/23 1357 06/21/23 1445  BP: (!) 142/78 (!) 145/70  109/67  Pulse: (!) 59 (!) 59  (!) 59  Resp: 17 13  17   Temp: 97.6 F (36.4 C)  97.7 F (36.5 C)   TempSrc: Oral  Oral   SpO2: 100% 100%  97%  Weight:      Height:      PainSc:        Isolation Precautions Airborne and Contact  precautions  Medications Medications  metoprolol tartrate (LOPRESSOR) tablet 25 mg (has no administration in time range)  atorvastatin (LIPITOR) tablet 80 mg (has no administration in time range)  pantoprazole (PROTONIX) EC tablet 40 mg (has no administration in time range)  apixaban (ELIQUIS) tablet 5 mg (has no administration in time range)  clopidogrel (PLAVIX) tablet 75 mg (has no administration in time range)  albuterol (PROVENTIL) (2.5 MG/3ML) 0.083% nebulizer solution 2.5 mg (2.5 mg Inhalation Given 06/21/23 1651)  sodium chloride flush (NS) 0.9 % injection 3 mL (3 mLs Intravenous Not Given 06/21/23 1247)  furosemide (LASIX) injection 60 mg (has no administration in time range)  acetaminophen (TYLENOL) tablet 650 mg (has no administration in time range)    Or  acetaminophen (TYLENOL) suppository 650 mg (has no administration in time range)  polyethylene glycol (MIRALAX / GLYCOLAX) packet 17 g (has no administration in time range)  insulin aspart (novoLOG) injection 0-15 Units (has no administration in time range)  insulin glargine-yfgn (SEMGLEE) injection 10 Units (has no administration in time range)  escitalopram (LEXAPRO) tablet 10 mg (has no administration in time range)  gabapentin (NEURONTIN) capsule 300 mg (has no administration in time range)  traZODone (DESYREL) tablet 75 mg (has no administration in time range)  furosemide (LASIX) injection 60 mg (60 mg Intravenous Given 06/21/23 1100)    Mobility walks     Focused Assessments Cardiac Assessment Handoff:  Cardiac Rhythm: Normal sinus rhythm Lab Results  Component Value Date   CKTOTAL 235 (H) 10/14/2016   CKMB 4.4 (H) 10/14/2016   TROPONINI <0.03 01/06/2018   Lab Results  Component Value Date   DDIMER 0.79 (H) 07/17/2020   Does the Patient currently have chest pain? No   , Pulmonary Assessment Handoff:  Lung sounds:   O2 Device: Room Air      R Recommendations: See Admitting Provider Note  Report  given to:   Additional Notes:

## 2023-06-21 NOTE — ED Provider Notes (Signed)
[place] EMERGENCY DEPARTMENT AT [facility] Provider Note   CSN: [id number] Arrival date & time: 06/21/23  1002     History  Chief Complaint  Patient presents with   Shortness of Breath    Shane [name] is a 82 y.o. male.  With past medical history of CAD, interstitial lung disease, hypertension, diabetes, CKD stage III, pulmonary fibrosis, obstructive sleep apnea on CPAP at night, chronic atrial fibrillation on Eliquis who presents to the emergency department with shortness of breath.  Patient states that he has had worsening shortness of breath and lower extremity swelling over the past 1 month.  He states that he is dyspneic on minimal exertion despite taking his home Lasix.  With his shortness of breath he intermittently will have central chest pain that is nonradiating. Denies having cough and fever. No palpitations. He is still laying flat to sleep but does complain of PND intermittently. He went to his PCP appointment today and they sent him here for further evaluation. Also, he does state that his left foot has been more red than normal, and he is worried about two areas that have recently opened up in the past 1 week. He has previous guillotine amputation of the left metatarsals.     Shortness of Breath Associated symptoms: chest pain        Home Medications Prior to Admission medications   Medication Sig Start Date End Date Taking? Authorizing Provider  fluocinonide (LIDEX) 0.05 % external solution SMARTSIG:1 Sparingly Topical Every Night PRN 05/24/23  Yes [provider]  mupirocin ointment (BACTROBAN) 2 % Apply 1 Application topically 2 (two) times daily. 06/06/23  Yes [provider]  ondansetron (ZOFRAN) 4 MG tablet Take by mouth. 06/15/23  Yes [provider]  predniSONE (STERAPRED UNI-PAK 48 TAB) 10 MG (48) TBPK tablet Take by mouth daily. 05/29/23  Yes [provider]  traZODone (DESYREL) 50 MG tablet Take 75 mg by mouth  at bedtime. 05/11/23  Yes [provider]  acetaminophen (TYLENOL) 325 MG tablet Take 1-2 tablets (325-650 mg total) by mouth every 6 (six) hours as needed for mild pain (pain score 1-3 or temp > 100.5). Patient not taking: Reported on 02/09/2023 03/26/21   Dorcas Carrow, MD  albuterol (VENTOLIN HFA) 108 (90 Base) MCG/ACT inhaler Inhale 2 puffs into the lungs every 4 (four) hours as needed for wheezing or shortness of breath. 04/12/21   Angiulli, Mcarthur Rossetti, PA-C  apixaban (ELIQUIS) 5 MG TABS tablet Take 1 tablet (5 mg total) by mouth 2 (two) times daily. 04/12/21   Angiulli, Mcarthur Rossetti, PA-C  Ascorbic Acid (VITAMIN C WITH ROSE HIPS) 500 MG tablet Take 1 tablet (500 mg total) by mouth daily. 04/12/21   Angiulli, Mcarthur Rossetti, PA-C  atorvastatin (LIPITOR) 80 MG tablet TAKE 1 TABLET BY MOUTH EVERY DAY 07/25/22   Lewayne Bunting, MD  clopidogrel (PLAVIX) 75 MG tablet Take 75 mg by mouth daily. 09/04/22   [provider]  docusate sodium (COLACE) 100 MG capsule Take 1 capsule (100 mg total) by mouth daily. 04/13/21   Angiulli, Mcarthur Rossetti, PA-C  escitalopram (LEXAPRO) 10 MG tablet Take 1 tablet (10 mg total) by mouth daily. 04/12/21   Angiulli, Mcarthur Rossetti, PA-C  FARXIGA 10 MG TABS tablet Take 10 mg by mouth daily.    [provider]  furosemide (LASIX) 40 MG tablet Take 1 tablet (40 mg total) by mouth daily. 04/12/21   Angiulli, Mcarthur Rossetti, PA-C  gabapentin (NEURONTIN) 300  MG capsule TAKE 1 CAPSULE BY MOUTH THREE TIMES A DAY 12/14/21   Adonis Huguenin, NP  HUMALOG KWIKPEN 100 UNIT/ML KwikPen SMARTSIG:15-20 Unit(s) SUB-Q 3 Times Daily    [provider]  insulin aspart (NOVOLOG FLEXPEN) 100 UNIT/ML FlexPen Inject 6 Units into the skin 3 (three) times daily with meals. 04/12/21   Angiulli, Mcarthur Rossetti, PA-C  insulin glargine (LANTUS) 100 UNIT/ML Solostar Pen Inject 8 Units into the skin at bedtime. Patient not taking: Reported on 02/09/2023 04/12/21   Angiulli, Mcarthur Rossetti, PA-C  iron polysaccharides (NIFEREX)  150 MG capsule Take 1 capsule (150 mg total) by mouth daily with lunch. Patient not taking: Reported on 02/09/2023 04/12/21   Angiulli, Mcarthur Rossetti, PA-C  LORazepam (ATIVAN) 1 MG tablet Take 1 tablet (1 mg total) by mouth at bedtime. 04/12/21   Angiulli, Mcarthur Rossetti, PA-C  metoprolol tartrate (LOPRESSOR) 50 MG tablet Take 0.5 tablets (25 mg total) by mouth daily. 04/12/21   Angiulli, Mcarthur Rossetti, PA-C  Multiple Vitamin (MULTIVITAMIN WITH MINERALS) TABS tablet Take 1 tablet by mouth daily.    [provider]  Olopatadine HCl (PATADAY OP) Place 1 drop into both eyes daily as needed (allergies).    [provider]  pantoprazole (PROTONIX) 40 MG tablet Take 1 tablet (40 mg total) by mouth 2 (two) times daily. 04/12/21   Angiulli, Mcarthur Rossetti, PA-C  polyethylene glycol powder (GLYCOLAX/MIRALAX) 17 GM/SCOOP powder Take 17 g by mouth daily as needed for moderate constipation. 11/15/20   [provider]  Probiotic Product (PROBIOTIC PO) Take 1 capsule by mouth daily.    [provider]  traMADol (ULTRAM) 50 MG tablet Take 1 tablet (50 mg total) by mouth every 8 (eight) hours as needed for severe pain or moderate pain. Patient not taking: Reported on 02/09/2023 04/12/21   Angiulli, Mcarthur Rossetti, PA-C  TRESIBA FLEXTOUCH 100 UNIT/ML FlexTouch Pen Inject 20 Units into the skin daily. 12/22/22   [provider]  Zinc Oxide (TRIPLE PASTE) 12.8 % ointment Apply 1 application topically 2 (two) times daily. Apply to affected area 04/12/21   Angiulli, Mcarthur Rossetti, PA-C  zinc sulfate 220 (50 Zn) MG capsule Take 1 capsule (220 mg total) by mouth daily. 04/12/21   Angiulli, Mcarthur Rossetti, PA-C      Allergies    Codeine, Ofev [nintedanib], and Pirfenidone    Review of Systems   Review of Systems  Respiratory:  Positive for shortness of breath.   Cardiovascular:  Positive for chest pain.  All other systems reviewed and are negative.   Physical Exam Updated Vital Signs BP (!) 145/70   Pulse (!) 59   Temp  97.6 F (36.4 C) (Oral)   Resp 13   Ht 6' (1.829 m)   Wt 107.5 kg   SpO2 100%   BMI 32.14 kg/m  Physical Exam Vitals and nursing note reviewed.  Constitutional:      General: He is not in acute distress.    Appearance: Normal appearance. He is obese. He is ill-appearing. He is not toxic-appearing.  HENT:     Head: Normocephalic.  Eyes:     General: No scleral icterus. Neck:     Vascular: No JVD.  Cardiovascular:     Rate and Rhythm: Normal rate and regular rhythm.     Heart sounds: Murmur heard.     Systolic murmur is present.     Comments: Holosystolic murmur on exam.   Left guillotine amputation at the metatarsals.  There is 2+  pitting edema to the knee. Right side with AKA. Pulmonary:     Effort: Pulmonary effort is normal. No tachypnea or respiratory distress.     Breath sounds: Rales present.  Chest:     Chest wall: No tenderness.  Abdominal:     General: Bowel sounds are normal.     Palpations: Abdomen is soft.  Musculoskeletal:     Left lower leg: Edema present.  Feet:     Comments: Left foot is diffusely red to the level of the malleoli.  There are 2 punctate areas that are open they do not appear to be draining at this time. Skin:    General: Skin is warm and dry.     Capillary Refill: Capillary refill takes less than 2 seconds.  Neurological:     General: No focal deficit present.     Mental Status: He is alert and oriented to person, place, and time.  Psychiatric:        Mood and Affect: Mood normal.        Behavior: Behavior normal.     ED Results / Procedures / Treatments   Labs (all labs ordered are listed, but only abnormal results are displayed) Labs Reviewed  COMPREHENSIVE METABOLIC PANEL - Abnormal; Notable for the following components:      Result Value   Sodium 134 (*)    Chloride 97 (*)    Glucose, Bld 145 (*)    BUN 40 (*)    Creatinine, Ser 1.59 (*)    Calcium 8.8 (*)    Albumin 3.1 (*)    GFR, Estimated 43 (*)    All other  components within normal limits  BRAIN NATRIURETIC PEPTIDE - Abnormal; Notable for the following components:   B Natriuretic Peptide 248.7 (*)    All other components within normal limits  CBC WITH DIFFERENTIAL/PLATELET - Abnormal; Notable for the following components:   Hemoglobin 11.6 (*)    HCT 38.1 (*)    MCH 24.8 (*)    RDW 17.2 (*)    Eosinophils Absolute 0.6 (*)    All other components within normal limits  TROPONIN I (HIGH SENSITIVITY) - Abnormal; Notable for the following components:   Troponin I (High Sensitivity) 40 (*)    All other components within normal limits  TROPONIN I (HIGH SENSITIVITY)    EKG EKG Interpretation Date/Time:  Wednesday June 21 2023 10:12:40 EDT Ventricular Rate:  60 PR Interval:  73 QRS Duration:  178 QT Interval:  495 QTC Calculation: 495 R Axis:   -68  Text Interpretation: atrial flutter with paced ventricular rhythm Confirmed by Margarita Grizzle [phone]) on 06/21/2023 10:18:09 AM  Radiology DG Foot Complete Left  Result Date: 06/21/2023 CLINICAL DATA:  r/o osteomyelitis EXAM: LEFT FOOT - COMPLETE 3+ VIEW COMPARISON:  Radiograph from 04/23/2021 FINDINGS: Bone mineralization within normal limits for patient's age. 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 The resection margins are sharp. No focal bone erosions. No evidence of air within the overlying soft tissue stump. No radiographic evidence of acute osteomyelitis. Correlate clinically to determine the need for additional imaging with more sensitive modality such as MRI. No acute fracture or dislocation. No aggressive osseous lesion. Mild degenerative osteoarthritis of imaged joints. Calcaneal spur noted along the Achilles tendon and Plantar aponeurosis attachment sites. No focal soft tissue swelling. No radiopaque foreign bodies. 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 No radiographic evidence of acute osteomyelitis. Electronically  Signed   By: Rhea Belton  Ramiro Harvest M.D.   On: 06/21/2023 11:22   DG Chest 2 View  Result Date: 06/21/2023 CLINICAL DATA:  Shortness of breath EXAM: CHEST - 2 VIEW COMPARISON:  Chest radiograph dated 03/24/2021 FINDINGS: Low lung volumes with bronchovascular crowding. Bilateral interstitial opacities. Blunting of the bilateral costophrenic angles. No pneumothorax. Similar enlarged cardiomediastinal silhouette. Cervical spinal fixation hardware appears intact. Median sternotomy wires are nondisplaced. IMPRESSION: 1.  Low lung volumes with bronchovascular crowding. 2. Similar bilateral chronic-appearing interstitial opacities and reticulations. 3. Similar cardiomegaly. Electronically Signed   By: Agustin Cree M.D.   On: 06/21/2023 11:19    Procedures Procedures   Medications Ordered in ED Medications  furosemide (LASIX) injection 60 mg (60 mg Intravenous Given 06/21/23 1100)    ED Course/ Medical Decision Making/ A&P                             Medical Decision Making Amount and/or Complexity of Data Reviewed Labs: ordered. Radiology: ordered.  Risk Prescription drug management. Decision regarding hospitalization.  Initial Impression and Ddx 82 year old male who presents to the emergency department with shortness of breath Patient PMH that increases complexity of ED encounter:   CAD, interstitial lung disease, hypertension, diabetes, CKD stage III, pulmonary fibrosis, obstructive sleep apnea on CPAP at night, chronic atrial fibrillation on Eliquis Differential: ACS, PE, pneumothorax, pleural effusion, cardiac tamponade, aortic dissection, COPD exacerbation, pneumonia, asthma exacerbation, congestive heart failure, viral upper respiratory infection, medication side effect, anaphylaxis, etc.    Interpretation of Diagnostics I independent reviewed and interpreted the labs as followed: Troponin 40, appears to be around baseline.  Kidney function is stable.  BNP 248  - I independently visualized the  following imaging with scope of interpretation limited to determining acute life threatening conditions related to emergency care: Chest x-ray, which revealed interstitial opacities, blunted costophrenic angles  Patient Reassessment and Ultimate Disposition/Management On initial exam, elderly male presenting with shortness of breath.  He is in no acute respiratory distress, oxygenating well on room air.  He does have diffuse Rales bilaterally and left lower extremity pitting edema.  Clinically appears fluid volume overloaded. Will obtain labs including BNP, troponin, chest x-ray but expect to give IV Lasix.  Additionally his left foot appears erythematous and has been mildly tender.  Will get an x-ray to rule out osteomyelitis.  Chest x-ray with likely pulmonary edema although it is complicated by interstitial lung disease.  Given his clinical findings of Rales, lower extremity edema, shortness of breath giving 60 mg IV Lasix.  EKG without ischemia or infarction, troponin x 1 is stable.  He is chronically elevated which may be from demand versus impaired kidney function.  Do not feel that he is having ACS at this time. Symptoms are inconsistent with an acute PE.  Do not feel that he needs a D-dimer or CTA PE study at this time. Doubt other etiologies of his shortness of breath like tamponade, pneumothorax, dissection.  No cough or fever concerning for bacterial pneumonia.  Symptoms also inconsistent with dissection.  Will need to admit the patient for fluid volume overload.  He needs to be optimized with IV Lasix and then transition back to oral.  He is agreeable to plan.  I consulted and spoke with Dr. Alinda Money, hospitalist who agrees to admit the patient.  Patient management required discussion with the following services or consulting groups:  Hospitalist Service  Complexity of Problems Addressed Chronic illness with exacerbation  Additional Data  Reviewed and Analyzed Further history obtained  from: Past medical history and medications listed in the EMR, Prior ED visit notes, Recent PCP notes, and Care Everywhere  Patient Encounter Risk Assessment Consideration of hospitalization  Final Clinical Impression(s) / ED Diagnoses Final diagnoses:  Shortness of breath    Rx / DC Orders ED Discharge Orders     None         Cristopher Peru, PA-C 06/21/23 1231    Margarita Grizzle, MD 06/22/23 [phone]

## 2023-06-21 NOTE — ED Notes (Signed)
PT was having trouble breathing, O2 was 97%, assessed lung sounds.PT requested a PRN nebulizer treatment

## 2023-06-21 NOTE — ED Notes (Signed)
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

## 2023-06-21 NOTE — H&P (Signed)
History and Physical   Shane [name] WGN:[id number] DOB: 1941/07/15 DOA: 06/21/2023  PCP: [name], MD   Patient coming from: Home  Chief Complaint: Shortness of breath  HPI: Shane [name] is a 82 y.o. male with medical history significant of hypertension, diabetes, CKD 3A, CAD status post CABG, PVD, OSA on CPAP, chronic CHF, status post right BKA, atrial fibrillation, SSS status post pacemaker, pulmonary fibrosis, adrenal sufficiency presenting with shortness of breath.  Patient reports about a month of worsening shortness of breath and lower extremity edema.  States that he has significant dyspnea on exertion with minimal activity.  Has tried home Lasix without improvement, did increase dose yesterday without change.  He does report some chest pain that comes with his shortness of breath intermittently.  Went to see his PCP today and was sent to the ED for further evaluation and treatment.  Additionally has reported some worsening erythema of his left foot (is status post ray amputation on this foot as well).  He denies fevers, chills, abdominal pain, constipation, diarrhea, nausea, vomiting.  ED Course: Vital signs in the ED notable for blood pressure in the 140s, heart rate in the 50s to 60s.  Lab workup included CMP with sodium 134, chloride 97, BUN 40, creatinine stable 1.59, glucose 145, calcium 8.8, albumin 3.1.  CBC with hemoglobin stable at 11.6.  BNP borderline at 248.  Troponin mildly elevated at 40 with repeat pending.  Chest x-ray showed low lung volumes, similar chronic interstitial opacities and reticular pattern, similar cardiomegaly.  Left foot x-ray showed patient being status post transmetatarsal amputation but otherwise no acute abnormality nor concern for osteomyelitis.  Patient received 60 mg IV Lasix in the ED.  Review of Systems: As per HPI otherwise all other systems reviewed and are negative.  Past Medical History:  Diagnosis Date   Acute blood loss  anemia    Anxiety    AV block, Mobitz 1    Cataract    Chronic kidney disease    d/t DM   CKD (chronic kidney disease), stage III (HCC)    COVID-19 virus infection 07/18/2020   Last Assessment & Plan:   Formatting of this note might be different from the original.  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 10/2019 Echo: EF 55-60%, Gr2 DD. Nl RV.    Neuromuscular disorder (HCC)    Neuropathy associated with endocrine disorder (HCC)    Nonobstructive CAD (coronary artery disease)    a. 2012 Cath: mod, nonobs dzs; b. 10/2016 MV: EF 60%, no ischemia.   OSA on CPAP 05/05/2017   Unattended Home Sleep Test 7/2/813-AHI 38.6/hour, desaturation to 64%, body weight 261 pounds   PONV (postoperative nausea and vomiting)    Postoperative anemia due to acute blood loss 11/07/2020   Postoperative hemorrhagic shock 03/20/2021   Sleep apnea     uses cpap asked to bring mask and tubing    Past Surgical History:  Procedure  Laterality Date   ABDOMINAL AORTOGRAM W/LOWER EXTREMITY N/A 12/10/2020   Procedure: ABDOMINAL AORTOGRAM W/LOWER EXTREMITY;  Surgeon: Leonie Douglas, MD;  Location: [facility] INVASIVE CV LAB;  Service: Cardiovascular;  Laterality: N/A;   AMPUTATION Right 01/22/2021   Procedure: 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;  Surgeon: [name], MD;  Location: [facility] OR;  Service: Orthopedics;  Laterality: Right;   AMPUTATION Right 03/17/2021   Procedure: RIGHT BELOW KNEE AMPUTATION;  Surgeon: [name], MD;  Location: [facility] OR;  Service: Orthopedics;   Laterality: Right;   ANKLE FUSION Right 01/22/2021   Procedure: RIGHT FOOT TIBIOCALCANEAL FUSION;  Surgeon: [name], MD;  Location: [facility] OR;  Service: Orthopedics;  Laterality: Right;   ANTERIOR FUSION CERVICAL SPINE  2012   CARDIAC CATHETERIZATION  2011   CARDIAC CATHETERIZATION N/A 11/09/2016   Procedure: Right Heart Cath;  Surgeon: Lyn Records, MD;  Location: [facility] INVASIVE CV LAB;  Service: Cardiovascular;  Laterality: N/A;   carpel tunnel     left wrist   CATARACT EXTRACTION     CATARACT EXTRACTION W/ INTRAOCULAR LENS  IMPLANT, BILATERAL  2013   CERVICAL LAMINECTOMY  2012   COLONOSCOPY N/A 01/14/2013   Procedure: COLONOSCOPY;  Surgeon: Hilarie Fredrickson, MD;  Location: [facility] ENDOSCOPY;  Service: Endoscopy;  Laterality: N/A;   CORONARY ARTERY BYPASS GRAFT  11/04/2020   LIMA-LAD, SVG-OM1, SVG-PDA ([facility]) dc 11/18/2020   EYE SURGERY     I & D EXTREMITY Right 02/19/2021   Procedure: RIGHT ANKLE DEBRIDEMENT AND PLACEMENT ANTIBIOTIC BEADS;  Surgeon: [name], MD;  Location: [facility] OR;  Service: Orthopedics;  Laterality: Right;   KNEE SURGERY  1998   left   LEFT HEART CATH AND CORONARY ANGIOGRAPHY N/A 07/10/2020   Procedure: LEFT HEART CATH AND CORONARY ANGIOGRAPHY;  Surgeon: Tonny Bollman, MD;  Location: [facility] INVASIVE CV LAB;  Service: Cardiovascular;  Laterality: N/A;   LUMBAR LAMINECTOMY  2003   LUNG BIOPSY Left 12/26/2016   Procedure: LUNG BIOPSY;  Surgeon: Loreli Slot, MD;  Location: [facility] OR;  Service: Thoracic;  Laterality: Left;   PACEMAKER IMPLANT N/A 03/30/2020   Procedure: PACEMAKER IMPLANT;  Surgeon: Marinus Maw, MD;  Location: [facility] INVASIVE CV LAB;  Service: Cardiovascular;  Laterality: N/A;   PERIPHERAL VASCULAR INTERVENTION Right 12/10/2020   Procedure: PERIPHERAL VASCULAR INTERVENTION;  Surgeon: Leonie Douglas, MD;  Location: [facility] CV LAB;  Service: Cardiovascular;  Laterality: Right;  SFA   POSTERIOR FUSION CERVICAL SPINE  2012   PPM GENERATOR REMOVAL  N/A 12/14/2020   Procedure: PPM GENERATOR REMOVAL;  Surgeon: Marinus Maw, MD;  Location: [facility] INVASIVE CV LAB;  Service: Cardiovascular;  Laterality: N/A;   TEE WITHOUT CARDIOVERSION N/A 12/11/2020   Procedure: TRANSESOPHAGEAL ECHOCARDIOGRAM (TEE);  Surgeon: Sande Rives, MD;  Location: [facility] ENDOSCOPY;  Service: Cardiovascular;  Laterality: N/A;   TRIGGER FINGER RELEASE  2011   4th finger left hand   VIDEO ASSISTED THORACOSCOPY Left 12/26/2016   Procedure: VIDEO ASSISTED THORACOSCOPY;  Surgeon: Loreli Slot, MD;  Location: [facility] OR;  Service: Thoracic;  Laterality: Left;   VIDEO BRONCHOSCOPY N/A 12/26/2016   Procedure: VIDEO BRONCHOSCOPY;  Surgeon: Loreli Slot, MD;  Location: [facility] OR;  Service: Thoracic;  Laterality: N/A;    Social History  reports that he has never smoked. He has never used smokeless tobacco. He reports that he does not drink alcohol and does not use drugs.  Allergies  Allergen Reactions   Codeine  Hives and Itching   Ofev [Nintedanib] Diarrhea    SEVERE DIARRHEA   Pirfenidone Diarrhea and Other (See Comments)    Esbriet (Pirfenidone) causes elevated LFTs. D/C on 06/14/17 and SEVERE DIARRHEA     Family History  Problem Relation Age of Onset   Diabetes Mellitus II Mother    Emphysema Father 44   Heart attack Father    Colon cancer Neg Hx    Esophageal cancer Neg Hx    Rectal cancer Neg Hx    Stomach cancer Neg Hx   Reviewed on admission  Prior to Admission medications   Medication Sig Start Date End Date Taking? Authorizing Provider  fluocinonide (LIDEX) 0.05 % external solution SMARTSIG:1 Sparingly Topical Every Night PRN 05/24/23  Yes [provider]  mupirocin ointment (BACTROBAN) 2 % Apply 1 Application topically 2 (two) times daily. 06/06/23  Yes [provider]  ondansetron (ZOFRAN) 4 MG tablet Take by mouth. 06/15/23  Yes [provider]  predniSONE (STERAPRED UNI-PAK 48 TAB) 10 MG (48) TBPK tablet Take by mouth  daily. 05/29/23  Yes [provider]  traZODone (DESYREL) 50 MG tablet Take 75 mg by mouth at bedtime. 05/11/23  Yes [provider]  albuterol (VENTOLIN HFA) 108 (90 Base) MCG/ACT inhaler Inhale 2 puffs into the lungs every 4 (four) hours as needed for wheezing or shortness of breath. 04/12/21   Angiulli, Mcarthur Rossetti, PA-C  apixaban (ELIQUIS) 5 MG TABS tablet Take 1 tablet (5 mg total) by mouth 2 (two) times daily. 04/12/21   Angiulli, Mcarthur Rossetti, PA-C  Ascorbic Acid (VITAMIN C WITH ROSE HIPS) 500 MG tablet Take 1 tablet (500 mg total) by mouth daily. 04/12/21   Angiulli, Mcarthur Rossetti, PA-C  atorvastatin (LIPITOR) 80 MG tablet TAKE 1 TABLET BY MOUTH EVERY DAY 07/25/22   Lewayne Bunting, MD  clopidogrel (PLAVIX) 75 MG tablet Take 75 mg by mouth daily. 09/04/22   [provider]  docusate sodium (COLACE) 100 MG capsule Take 1 capsule (100 mg total) by mouth daily. 04/13/21   Angiulli, Mcarthur Rossetti, PA-C  escitalopram (LEXAPRO) 10 MG tablet Take 1 tablet (10 mg total) by mouth daily. 04/12/21   Angiulli, Mcarthur Rossetti, PA-C  FARXIGA 10 MG TABS tablet Take 10 mg by mouth daily.    [provider]  furosemide (LASIX) 40 MG tablet Take 1 tablet (40 mg total) by mouth daily. 04/12/21   Angiulli, Mcarthur Rossetti, PA-C  gabapentin (NEURONTIN) 300 MG capsule TAKE 1 CAPSULE BY MOUTH THREE TIMES A DAY 12/14/21   Adonis Huguenin, NP  HUMALOG KWIKPEN 100 UNIT/ML KwikPen SMARTSIG:15-20 Unit(s) SUB-Q 3 Times Daily    [provider]  insulin aspart (NOVOLOG FLEXPEN) 100 UNIT/ML FlexPen Inject 6 Units into the skin 3 (three) times daily with meals. 04/12/21   Angiulli, Mcarthur Rossetti, PA-C  iron polysaccharides (NIFEREX) 150 MG capsule Take 1 capsule (150 mg total) by mouth daily with lunch. Patient not taking: Reported on 02/09/2023 04/12/21   Angiulli, Mcarthur Rossetti, PA-C  LORazepam (ATIVAN) 1 MG tablet Take 1 tablet (1 mg total) by mouth at bedtime. 04/12/21   Angiulli, Mcarthur Rossetti, PA-C  metoprolol tartrate (LOPRESSOR) 50 MG  tablet Take 0.5 tablets (25 mg total) by mouth daily. 04/12/21   Angiulli, Mcarthur Rossetti, PA-C  Multiple Vitamin (MULTIVITAMIN WITH MINERALS) TABS tablet Take 1 tablet by mouth daily.    [provider]  Olopatadine HCl (PATADAY OP) Place 1 drop into both eyes daily as needed (allergies).  [provider]  pantoprazole (PROTONIX) 40 MG tablet Take 1 tablet (40 mg total) by mouth 2 (two) times daily. 04/12/21   Angiulli, Mcarthur Rossetti, PA-C  polyethylene glycol powder (GLYCOLAX/MIRALAX) 17 GM/SCOOP powder Take 17 g by mouth daily as needed for moderate constipation. 11/15/20   [provider]  Probiotic Product (PROBIOTIC PO) Take 1 capsule by mouth daily.    [provider]  TRESIBA FLEXTOUCH 100 UNIT/ML FlexTouch Pen Inject 20 Units into the skin daily. 12/22/22   [provider]  Zinc Oxide (TRIPLE PASTE) 12.8 % ointment Apply 1 application topically 2 (two) times daily. Apply to affected area 04/12/21   Angiulli, Mcarthur Rossetti, PA-C  zinc sulfate 220 (50 Zn) MG capsule Take 1 capsule (220 mg total) by mouth daily. 04/12/21   Charlton Amor, PA-C    Physical Exam: Vitals:   06/21/23 1008 06/21/23 1016 06/21/23 1030  BP:  (!) 142/78 (!) 145/70  Pulse:  (!) 59 (!) 59  Resp:  17 13  Temp:  97.6 F (36.4 C)   TempSrc:  Oral   SpO2:  100% 100%  Weight: 107.5 kg    Height: 6' (1.829 m)      Physical Exam Constitutional:      General: He is not in acute distress.    Appearance: Normal appearance. He is obese.  HENT:     Head: Normocephalic and atraumatic.     Mouth/Throat:     Mouth: Mucous membranes are moist.     Pharynx: Oropharynx is clear.  Eyes:     Extraocular Movements: Extraocular movements intact.     Pupils: Pupils are equal, round, and reactive to light.  Cardiovascular:     Rate and Rhythm: Normal rate and regular rhythm.     Pulses: Normal pulses.     Heart sounds: Normal heart sounds.  Pulmonary:     Effort: Pulmonary effort is normal. No  respiratory distress.     Breath sounds: Rales present.  Abdominal:     General: Bowel sounds are normal. There is no distension.     Palpations: Abdomen is soft.     Tenderness: There is no abdominal tenderness.  Musculoskeletal:        General: No swelling or deformity.     Left lower leg: Edema present.  Skin:    General: Skin is warm and dry.  Neurological:     General: No focal deficit present.     Mental Status: Mental status is at baseline.    Labs on Admission: I have personally reviewed following labs and imaging studies  CBC: Recent Labs  Lab 06/21/23 1027  WBC 6.0  NEUTROABS 3.7  HGB 11.6*  HCT 38.1*  MCV 81.6  PLT 185    Basic Metabolic Panel: Recent Labs  Lab 06/21/23 1027  NA 134*  K 3.8  CL 97*  CO2 25  GLUCOSE 145*  BUN 40*  CREATININE 1.59*  CALCIUM 8.8*    GFR: Estimated Creatinine Clearance: 45.4 mL/min (A) (by C-G formula based on SCr of 1.59 mg/dL (H)).  Liver Function Tests: Recent Labs  Lab 06/21/23 1027  AST 19  ALT 21  ALKPHOS 121  BILITOT 0.6  PROT 7.0  ALBUMIN 3.1*    Urine analysis:    Component Value Date/Time   COLORURINE YELLOW 12/08/2020 1954   APPEARANCEUR CLEAR 12/08/2020 1954   LABSPEC 1.014 12/08/2020 1954   PHURINE 5.0 12/08/2020 1954   GLUCOSEU NEGATIVE 12/08/2020 1954   HGBUR NEGATIVE  12/08/2020 1954   BILIRUBINUR NEGATIVE 12/08/2020 1954   KETONESUR NEGATIVE 12/08/2020 1954   PROTEINUR 100 (A) 12/08/2020 1954   UROBILINOGEN 1.0 09/27/2011 1112   NITRITE NEGATIVE 12/08/2020 1954   LEUKOCYTESUR NEGATIVE 12/08/2020 1954    Radiological Exams on Admission: DG Foot Complete Left  Result Date: 06/21/2023 CLINICAL DATA:  r/o osteomyelitis EXAM: LEFT FOOT - COMPLETE 3+ VIEW COMPARISON:  Radiograph from 04/23/2021 FINDINGS: Bone mineralization within normal limits for patient's age. 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 The resection margins are sharp. No focal bone erosions. No  evidence of air within the overlying soft tissue stump. No radiographic evidence of acute osteomyelitis. Correlate clinically to determine the need for additional imaging with more sensitive modality such as MRI. No acute fracture or dislocation. No aggressive osseous lesion. Mild degenerative osteoarthritis of imaged joints. Calcaneal spur noted along the Achilles tendon and Plantar aponeurosis attachment sites. No focal soft tissue swelling. No radiopaque foreign bodies. 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 No radiographic evidence of acute osteomyelitis. Electronically Signed   By: Jules Schick M.D.   On: 06/21/2023 11:22   DG Chest 2 View  Result Date: 06/21/2023 CLINICAL DATA:  Shortness of breath EXAM: CHEST - 2 VIEW COMPARISON:  Chest radiograph dated 03/24/2021 FINDINGS: Low lung volumes with bronchovascular crowding. Bilateral interstitial opacities. Blunting of the bilateral costophrenic angles. No pneumothorax. Similar enlarged cardiomediastinal silhouette. Cervical spinal fixation hardware appears intact. Median sternotomy wires are nondisplaced. IMPRESSION: 1.  Low lung volumes with bronchovascular crowding. 2. Similar bilateral chronic-appearing interstitial opacities and reticulations. 3. Similar cardiomegaly. Electronically Signed   By: Agustin Cree M.D.   On: 06/21/2023 11:19    EKG: Independently reviewed.  Paced ventricular rhythm at 60 bpm.  Appearance of underlying complete heart block.  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 > Worsening shortness of breath and right lower extremity edema (status post left BKA) > Not improving with outpatient diuretics.  BNP elevated but still somewhat borderline at 248.  Chest x-ray showing similar chronic interstitial opacities and reticular changes, patient does have known pulmonary fibrosis likely difficult to distinguish if there is mild to moderate overlying fluid. > Received 60 mg IV Lasix in the ED. - Monitor on telemetry - Continue with 60 mg IV Lasix twice daily - Echocardiogram - Strict I's and O's, daily weights - Check magnesium - Trend renal function and electrolytes - Continue home metoprolol, Farxiga  Pulmonary fibrosis > Currently on supportive care only per recent pulmonology note, had failed antibiotics in the past (did not have any improvement on them). - Supportive care - As needed albuterol  Atrial fibrillation Sick sinus syndrome Heart block > Status post pacemaker.  Currently paced rhythm in the ED. -  Continue home metoprolol, Eliquis  OSA - Continue home CPAP   CAD > Status post CABG x 3. > Troponin mildly elevated in the ED at 40 with repeat pending. - Continue home Plavix, metoprolol, atorvastatin, Eliquis  PVD - Continue home Plavix, Eliquis, atorvastatin  CKD 3a > Creatinine stable at 1.59 in the ED. 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 Observation status is judged to be reasonable and necessary in order to provide the required intensity of service to ensure the patient's safety. The patient's presenting symptoms, physical exam findings, and initial radiographic and laboratory data in the context of their medical condition is felt to place them at decreased risk for further clinical deterioration. Furthermore, it is anticipated that the patient will be medically stable for discharge from the hospital within 2 midnights of admission.    Synetta Fail MD [facility]  How to contact the [facility] Attending or Consulting provider 7A - 7P or covering provider during after hours 7P -7A, for this patient?   Check the care team in [facility] and look for a) attending/consulting [facility] provider listed and b) the [facility] team listed Log into [email or web address] and use [facility]'s universal password to access. If you do not have the password, please contact the hospital operator. Locate the [facility] provider you are looking for under [facility] and page to a number that you can be directly reached. If you still have difficulty reaching the provider, please page the [facility] (Director on Call) for the Hospitalists listed on amion for assistance.  06/21/2023, 12:31 PM

## 2023-06-22 ENCOUNTER — Observation Stay (HOSPITAL_COMMUNITY): Payer: Medicare Other

## 2023-06-22 DIAGNOSIS — I5033 Acute on chronic diastolic (congestive) heart failure: Secondary | ICD-10-CM | POA: Diagnosis not present

## 2023-06-22 DIAGNOSIS — Z950 Presence of cardiac pacemaker: Secondary | ICD-10-CM | POA: Diagnosis not present

## 2023-06-22 DIAGNOSIS — Z89511 Acquired absence of right leg below knee: Secondary | ICD-10-CM | POA: Diagnosis not present

## 2023-06-22 DIAGNOSIS — E785 Hyperlipidemia, unspecified: Secondary | ICD-10-CM | POA: Diagnosis present

## 2023-06-22 DIAGNOSIS — I5043 Acute on chronic combined systolic (congestive) and diastolic (congestive) heart failure: Secondary | ICD-10-CM | POA: Diagnosis present

## 2023-06-22 DIAGNOSIS — I130 Hypertensive heart and chronic kidney disease with heart failure and stage 1 through stage 4 chronic kidney disease, or unspecified chronic kidney disease: Secondary | ICD-10-CM | POA: Diagnosis present

## 2023-06-22 DIAGNOSIS — E1122 Type 2 diabetes mellitus with diabetic chronic kidney disease: Secondary | ICD-10-CM | POA: Diagnosis present

## 2023-06-22 DIAGNOSIS — I442 Atrioventricular block, complete: Secondary | ICD-10-CM | POA: Diagnosis present

## 2023-06-22 DIAGNOSIS — N1831 Chronic kidney disease, stage 3a: Secondary | ICD-10-CM | POA: Diagnosis present

## 2023-06-22 DIAGNOSIS — I350 Nonrheumatic aortic (valve) stenosis: Secondary | ICD-10-CM | POA: Diagnosis present

## 2023-06-22 DIAGNOSIS — I495 Sick sinus syndrome: Secondary | ICD-10-CM | POA: Diagnosis present

## 2023-06-22 DIAGNOSIS — F32A Depression, unspecified: Secondary | ICD-10-CM | POA: Diagnosis present

## 2023-06-22 DIAGNOSIS — I5021 Acute systolic (congestive) heart failure: Secondary | ICD-10-CM | POA: Diagnosis not present

## 2023-06-22 DIAGNOSIS — Z8616 Personal history of COVID-19: Secondary | ICD-10-CM | POA: Diagnosis not present

## 2023-06-22 DIAGNOSIS — Z951 Presence of aortocoronary bypass graft: Secondary | ICD-10-CM | POA: Diagnosis not present

## 2023-06-22 DIAGNOSIS — Z794 Long term (current) use of insulin: Secondary | ICD-10-CM | POA: Diagnosis not present

## 2023-06-22 DIAGNOSIS — Z89512 Acquired absence of left leg below knee: Secondary | ICD-10-CM | POA: Diagnosis not present

## 2023-06-22 DIAGNOSIS — I482 Chronic atrial fibrillation, unspecified: Secondary | ICD-10-CM | POA: Diagnosis present

## 2023-06-22 DIAGNOSIS — I459 Conduction disorder, unspecified: Secondary | ICD-10-CM | POA: Diagnosis present

## 2023-06-22 DIAGNOSIS — J84112 Idiopathic pulmonary fibrosis: Secondary | ICD-10-CM | POA: Diagnosis present

## 2023-06-22 DIAGNOSIS — E669 Obesity, unspecified: Secondary | ICD-10-CM | POA: Diagnosis present

## 2023-06-22 DIAGNOSIS — R0602 Shortness of breath: Secondary | ICD-10-CM | POA: Diagnosis present

## 2023-06-22 DIAGNOSIS — Z66 Do not resuscitate: Secondary | ICD-10-CM | POA: Diagnosis present

## 2023-06-22 DIAGNOSIS — E1165 Type 2 diabetes mellitus with hyperglycemia: Secondary | ICD-10-CM | POA: Diagnosis present

## 2023-06-22 DIAGNOSIS — G4733 Obstructive sleep apnea (adult) (pediatric): Secondary | ICD-10-CM | POA: Diagnosis present

## 2023-06-22 DIAGNOSIS — E113593 Type 2 diabetes mellitus with proliferative diabetic retinopathy without macular edema, bilateral: Secondary | ICD-10-CM | POA: Diagnosis present

## 2023-06-22 DIAGNOSIS — I5023 Acute on chronic systolic (congestive) heart failure: Secondary | ICD-10-CM | POA: Diagnosis present

## 2023-06-22 DIAGNOSIS — E1151 Type 2 diabetes mellitus with diabetic peripheral angiopathy without gangrene: Secondary | ICD-10-CM | POA: Diagnosis present

## 2023-06-22 LAB — GLUCOSE, CAPILLARY
Glucose-Capillary: 150 mg/dL — ABNORMAL HIGH (ref 70–99)
Glucose-Capillary: 271 mg/dL — ABNORMAL HIGH (ref 70–99)
Glucose-Capillary: 179 mg/dL — ABNORMAL HIGH (ref 70–99)
Glucose-Capillary: 199 mg/dL — ABNORMAL HIGH (ref 70–99)

## 2023-06-22 LAB — COMPREHENSIVE METABOLIC PANEL
ALT: 18 U/L (ref 0–44)
AST: 17 U/L (ref 15–41)
Albumin: 2.8 g/dL — ABNORMAL LOW (ref 3.5–5.0)
Alkaline Phosphatase: 98 U/L (ref 38–126)
Anion gap: 7 (ref 5–15)
BUN: 37 mg/dL — ABNORMAL HIGH (ref 8–23)
CO2: 28 mmol/L (ref 22–32)
Calcium: 8.4 mg/dL — ABNORMAL LOW (ref 8.9–10.3)
Chloride: 100 mmol/L (ref 98–111)
Creatinine, Ser: 1.82 mg/dL — ABNORMAL HIGH (ref 0.61–1.24)
GFR, Estimated: 37 mL/min — ABNORMAL LOW (ref >60)
Glucose, Bld: 151 mg/dL — ABNORMAL HIGH (ref 70–99)
Potassium: 3.8 mmol/L (ref 3.5–5.1)
Sodium: 135 mmol/L (ref 135–145)
Total Bilirubin: 0.9 mg/dL (ref 0.3–1.2)
Total Protein: 6.3 g/dL — ABNORMAL LOW (ref 6.5–8.1)

## 2023-06-22 LAB — CBC
HCT: 34.5 % — ABNORMAL LOW (ref 39.0–52.0)
Hemoglobin: 10.7 g/dL — ABNORMAL LOW (ref 13.0–17.0)
MCH: 24.7 pg — ABNORMAL LOW (ref 26.0–34.0)
MCHC: 31 g/dL (ref 30.0–36.0)
MCV: 79.7 fL — ABNORMAL LOW (ref 80.0–100.0)
Platelets: 186 10*3/uL (ref 150–400)
RBC: 4.33 MIL/uL (ref 4.22–5.81)
RDW: 17 % — ABNORMAL HIGH (ref 11.5–15.5)
WBC: 6.5 10*3/uL (ref 4.0–10.5)
nRBC: 0 % (ref 0.0–0.2)

## 2023-06-22 LAB — ECHOCARDIOGRAM COMPLETE
AR max vel: 0.94 cm2
AV Area VTI: 0.91 cm2
AV Area mean vel: 0.99 cm2
AV Mean grad: 33 mmHg
AV Peak grad: 52.4 mmHg
Ao pk vel: 3.62 m/s
Area-P 1/2: 5.31 cm2
Height: 72 in
S' Lateral: 3.9 cm
Weight: 3753.11 oz

## 2023-06-22 MED ORDER — PERFLUTREN LIPID MICROSPHERE
1.0000 mL | INTRAVENOUS | Status: AC | PRN
  Administered 2023-06-22: 4 mL via INTRAVENOUS

## 2023-06-22 NOTE — Progress Notes (Signed)
PROGRESS NOTE    Shane [name]  NWG:[id number] DOB: Aug 27, 1941 DOA: 06/21/2023 PCP: [name], MD  Outpatient Specialists:    Brief Narrative:  Patient is an 82 year old male with past medical history significant for hypertension, diabetes mellitus, chronic kidney disease stage III, coronary artery disease status post CABG, PVD, OSA on CPAP, chronic congestive heart failure, status post right BKA, atrial fibrillation, sick sinus syndrome s/p pacemaker placement, pulmonary fibrosis and adrenal insufficiency.  Patient was admitted with acute on chronic systolic congestive heart failure.  06/22/2023: Patient seen.  Patient seems to be improving with IV diuretics.  Continue IV diuretics for now.   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 > Worsening shortness of breath and right lower extremity edema (status post left BKA) > Not improving with outpatient diuretics.  BNP elevated but still somewhat borderline at 248.  Chest x-ray showing similar chronic interstitial opacities and reticular changes, patient does have known pulmonary fibrosis likely difficult to distinguish if there is mild to moderate overlying fluid. > Received 60 mg IV  Lasix in the ED. - Monitor on telemetry - Continue with 60 mg IV Lasix twice daily - Echocardiogram - Strict I's and O's, daily weights - Check magnesium - Trend renal function and electrolytes - Continue home metoprolol, Farxiga 06/22/2023: Continue IV Lasix.  Patient is slowly improving.  Low threshold to consult the cardiology team.   Pulmonary fibrosis > Currently on supportive care only per recent pulmonology note, had failed antibiotics in the past (did not have any improvement on them). - Supportive care - As needed albuterol   Atrial fibrillation Sick sinus syndrome Heart block > Status post pacemaker.  Currently paced rhythm in the ED. - Continue home metoprolol, Eliquis 06/22/2023: Heart rate is controlled.   OSA - Continue home CPAP    CAD > Status post CABG x 3. > Troponin mildly elevated in the ED at 40 with repeat pending. - Continue home Plavix, metoprolol, atorvastatin, Eliquis   PVD - Continue home Plavix, Eliquis, atorvastatin   CKD 3a > Creatinine stable at 1.59 in the ED. - Trend renal function and electrolytes 06/22/2023: Monitor closely.  Serum creatinine is 1.82 today (up from 1.59).   Hypertension - Lasix as above - Continue metoprolol 06/22/2023: Blood pressure is controlled.   Diabetes > 20 units daily and 8 units with meals at home - 10 units daily, SSI   Status post right BKA Status post left transmetatarsal amputation > Reporting some erythema at the right lower extremity - Continue to monitor    DVT prophylaxis: Eliquis. Code Status: DO NOT RESUSCITATE.  Intubation is okay. Family Communication:  Disposition Plan: Likely discharge back home eventually.   Consultants:  None.  Procedures:  Echocardiogram is pending.  Antimicrobials:  None.   Subjective: Shortness of breath is improving.  Objective: Vitals:   06/22/23 0718 06/22/23 1120 06/22/23 1500 06/22/23 1523  BP: (!) 141/67 (!) 120/59 119/67   Pulse: 60 60 60 60   Resp: 20 20 19 17   Temp: (!) 97.4 F (36.3 C) 98.3 F (36.8 C) 97.8 F (36.6 C)   TempSrc: Oral Oral Oral   SpO2: 96% 94% 100% 100%  Weight:      Height:        Intake/Output Summary (Last 24 hours) at 06/22/2023 1810 Last data filed at 06/22/2023 1747 Gross per 24 hour  Intake 1020 ml  Output 2350 ml  Net -1330 ml   Filed Weights   06/21/23 1008 06/22/23 0507  Weight: 107.5 kg 106.4 kg    Examination:  General exam: Appears calm and comfortable  Respiratory system: Clear to auscultation.  Cardiovascular system: S1 & S2 heard Gastrointestinal system: Abdomen is soft and nontender.  Central nervous system: Alert and oriented.  Patient moves all extremities. Extremities: 2-2+ edema left lower extremity.  Status post right BKA.  Data Reviewed: I have personally reviewed following labs and imaging studies  CBC: Recent Labs  Lab 06/21/23 1027 06/22/23 0232  WBC 6.0 6.5  NEUTROABS 3.7  --   HGB 11.6* 10.7*  HCT 38.1* 34.5*  MCV 81.6 79.7*  PLT 185 186   Basic Metabolic Panel: Recent Labs  Lab 06/21/23 1027 06/21/23 1330 06/22/23 0232  NA 134*  --  135  K 3.8  --  3.8  CL 97*  --  100  CO2 25  --  28  GLUCOSE 145*  --  151*  BUN 40*  --  37*  CREATININE 1.59*  --  1.82*  CALCIUM 8.8*  --  8.4*  MG  --  1.9  --    GFR: Estimated Creatinine Clearance: 39.4 mL/min (A) (by C-G formula based on SCr of 1.82 mg/dL (H)). Liver Function Tests: Recent Labs  Lab 06/21/23 1027 06/22/23 0232  AST 19 17  ALT 21 18  ALKPHOS 121 98  BILITOT 0.6 0.9  PROT 7.0 6.3*  ALBUMIN 3.1* 2.8*   No results for input(s): "LIPASE", "AMYLASE" in the last 168 hours. No results for input(s): "AMMONIA" in the last 168 hours. Coagulation Profile: No results for input(s): "INR", "PROTIME" in the last 168 hours. Cardiac Enzymes: No results for input(s): "CKTOTAL", "CKMB", "CKMBINDEX", "TROPONINI" in the last 168 hours. BNP (last 3 results) No results for input(s): "PROBNP" in  the last 8760 hours. HbA1C: No results for input(s): "HGBA1C" in the last 72 hours. CBG: Recent Labs  Lab 06/21/23 2118 06/22/23 0550 06/22/23 1117 06/22/23 1552  GLUCAP 179* 150* 271* 179*   Lipid Profile: No results for input(s): "CHOL", "HDL", "LDLCALC", "TRIG", "CHOLHDL", "LDLDIRECT" in the last 72 hours. Thyroid Function Tests: No results for input(s): "TSH", "T4TOTAL", "FREET4", "T3FREE", "THYROIDAB" in the last 72 hours. Anemia Panel: No results for input(s): "VITAMINB12", "FOLATE", "FERRITIN", "TIBC", "IRON", "RETICCTPCT" in the last 72 hours. Urine analysis:    Component Value Date/Time   COLORURINE YELLOW 12/08/2020 1954   APPEARANCEUR CLEAR 12/08/2020 1954   LABSPEC 1.014 12/08/2020 1954   PHURINE 5.0 12/08/2020 1954   GLUCOSEU NEGATIVE 12/08/2020 1954   HGBUR NEGATIVE 12/08/2020 1954   BILIRUBINUR NEGATIVE 12/08/2020 1954   KETONESUR NEGATIVE 12/08/2020 1954   PROTEINUR 100 (A) 12/08/2020 1954   UROBILINOGEN 1.0 09/27/2011 1112   NITRITE NEGATIVE 12/08/2020 1954  LEUKOCYTESUR NEGATIVE 12/08/2020 1954   Sepsis Labs: @LABRCNTIP (procalcitonin:4,lacticidven:4)  ) Recent Results (from the past 240 hour(s))  SARS Coronavirus 2 by RT PCR (hospital order, performed in [facility] hospital lab) *cepheid single result test* Anterior Nasal Swab     Status: None   Collection Time: 06/21/23  1:19 PM   Specimen: Anterior Nasal Swab  Result Value Ref Range Status   SARS Coronavirus 2 by RT PCR NEGATIVE NEGATIVE Final    Comment: Performed at [facility], [address], [place], [place] [address]         Radiology Studies: ECHOCARDIOGRAM COMPLETE  Result Date: 06/22/2023    ECHOCARDIOGRAM REPORT   Patient Name:   Shane [name] Date of Exam: 06/22/2023 Medical Rec #:  [id number]        Height:       72.0 in Accession #:    [id number]       Weight:       234.6 lb Date of Birth:  1941/10/12        BSA:          2.280 m Patient Age:    82 years         BP:            141/67 mmHg Patient Gender: M                HR:           61 bpm. Exam Location:  Inpatient Procedure: 2D Echo, Color Doppler, Cardiac Doppler and Intracardiac            Opacification Agent Indications:    I50.21 Acute systolic (congestive) heart failure  History:        Patient has prior history of Echocardiogram examinations, most                 recent 05/17/2022. CHF, Prior CABG and Pacemaker; Risk                 Factors:Hypertension, Diabetes, Dyslipidemia and Sleep Apnea.  Sonographer:    Irving Burton Senior RDCS Referring Phys: [id number] Cecille Po MELVIN  Sonographer Comments: Technically difficult study due to poor echo windows. Apicals too lateral to turn on left side. IMPRESSIONS  1. Left ventricular ejection fraction, by estimation, is 50 to 55%. The left ventricle has low normal function. Left ventricular endocardial border not optimally defined to evaluate regional wall motion. There is mild concentric left ventricular hypertrophy. Left ventricular diastolic parameters are consistent with Grade III diastolic dysfunction (restrictive).  2. Right ventricular systolic function is moderately reduced. The right ventricular size is not well visualized. Tricuspid regurgitation signal is inadequate for assessing PA pressure.  3. Left atrial size was severely dilated.  4. Right atrial size was moderately dilated.  5. The mitral valve is normal in structure. Trivial mitral valve regurgitation.  6. The aortic valve is tricuspid. There is severe calcifcation of the aortic valve. Aortic valve regurgitation is not visualized. Moderate to severe aortic valve stenosis. Aortic valve area, by VTI measures 0.91 cm. Aortic valve mean gradient measures 33.0 mmHg. Aortic valve Vmax measures 3.62 m/s.  7. Aortic dilatation noted. There is mild dilatation of the ascending aorta, measuring 42 mm. There is borderline dilatation of the aortic root, measuring 38 mm.  8. The inferior vena cava is dilated in size with <50%  respiratory variability, suggesting right atrial pressure of 15 mmHg. Conclusion(s)/Recommendation(s): The AS gradient has increased since previous study now moderate  to severe. FINDINGS  Left Ventricle: Left ventricular ejection fraction, by estimation, is 50 to 55%. The left ventricle has low normal function. Left ventricular endocardial border not optimally defined to evaluate regional wall motion. Definity contrast agent was given IV  to delineate the left ventricular endocardial borders. The left ventricular internal cavity size was normal in size. There is mild concentric left ventricular hypertrophy. Abnormal (paradoxical) septal motion consistent with post-operative status. Left ventricular diastolic parameters are consistent with Grade III diastolic dysfunction (restrictive). Right Ventricle: The right ventricular size is not well visualized. Right vetricular wall thickness was not well visualized. Right ventricular systolic function is moderately reduced. Tricuspid regurgitation signal is inadequate for assessing PA pressure. Left Atrium: Left atrial size was severely dilated. Right Atrium: Right atrial size was moderately dilated. Pericardium: There is no evidence of pericardial effusion. Mitral Valve: The mitral valve is normal in structure. Trivial mitral valve regurgitation. Tricuspid Valve: The tricuspid valve is normal in structure. Tricuspid valve regurgitation is mild. Aortic Valve: The aortic valve is tricuspid. There is severe calcifcation of the aortic valve. Aortic valve regurgitation is not visualized. Moderate to severe aortic stenosis is present. Aortic valve mean gradient measures 33.0 mmHg. Aortic valve peak gradient measures 52.4 mmHg. Aortic valve area, by VTI measures 0.91 cm. Pulmonic Valve: The pulmonic valve was grossly normal. Pulmonic valve regurgitation is trivial. Aorta: Aortic dilatation noted. There is mild dilatation of the ascending aorta, measuring 42 mm. There is  borderline dilatation of the aortic root, measuring 38 mm. Venous: The inferior vena cava is dilated in size with less than 50% respiratory variability, suggesting right atrial pressure of 15 mmHg. IAS/Shunts: No atrial level shunt detected by color flow Doppler. Additional Comments: A device lead is visualized in the right ventricle.  LEFT VENTRICLE PLAX 2D LVIDd:         5.20 cm   Diastology LVIDs:         3.90 cm   LV e' medial:    7.15 cm/s LV PW:         1.10 cm   LV E/e' medial:  17.1 LV IVS:        1.20 cm   LV e' lateral:   7.93 cm/s LVOT diam:     2.50 cm   LV E/e' lateral: 15.4 LV SV:         77 LV SV Index:   34 LVOT Area:     4.91 cm  RIGHT VENTRICLE RV S prime:     7.46 cm/s TAPSE (M-mode): 1.0 cm LEFT ATRIUM              Index        RIGHT ATRIUM           Index LA diam:        3.90 cm  1.71 cm/m   RA Area:     22.70 cm LA Vol (A2C):   112.0 ml 49.12 ml/m  RA Volume:   68.40 ml  30.00 ml/m LA Vol (A4C):   94.0 ml  41.23 ml/m LA Biplane Vol: 112.0 ml 49.12 ml/m  AORTIC VALVE AV Area (Vmax):    0.94 cm AV Area (Vmean):   0.99 cm AV Area (VTI):     0.91 cm AV Vmax:           362.00 cm/s AV Vmean:          268.000 cm/s AV VTI:  0.846 m AV Peak Grad:      52.4 mmHg AV Mean Grad:      33.0 mmHg LVOT Vmax:         69.00 cm/s LVOT Vmean:        54.200 cm/s LVOT VTI:          0.156 m LVOT/AV VTI ratio: 0.18  AORTA Ao Root diam: 3.80 cm Ao Asc diam:  3.85 cm MITRAL VALVE MV Area (PHT): 5.31 cm     SHUNTS MV Decel Time: 143 msec     Systemic VTI:  0.16 m MV E velocity: 122.00 cm/s  Systemic Diam: 2.50 cm MV A velocity: 67.50 cm/s MV E/A ratio:  1.81 Arvilla Meres MD Electronically signed by Arvilla Meres MD Signature Date/Time: 06/22/2023/12:27:58 PM    Final    DG Foot Complete Left  Result Date: 06/21/2023 CLINICAL DATA:  r/o osteomyelitis EXAM: LEFT FOOT - COMPLETE 3+ VIEW COMPARISON:  Radiograph from 04/23/2021 FINDINGS: Bone mineralization within normal limits for patient's age.  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 The resection margins are sharp. No focal bone erosions. No evidence of air within the overlying soft tissue stump. No radiographic evidence of acute osteomyelitis. Correlate clinically to determine the need for additional imaging with more sensitive modality such as MRI. No acute fracture or dislocation. No aggressive osseous lesion. Mild degenerative osteoarthritis of imaged joints. Calcaneal spur noted along the Achilles tendon and Plantar aponeurosis attachment sites. No focal soft tissue swelling. No radiopaque foreign bodies. 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 No radiographic evidence of acute osteomyelitis. Electronically Signed   By: Jules Schick M.D.   On: 06/21/2023 11:22   DG Chest 2 View  Result Date: 06/21/2023 CLINICAL DATA:  Shortness of breath EXAM: CHEST - 2 VIEW COMPARISON:  Chest radiograph dated 03/24/2021 FINDINGS: Low lung volumes with bronchovascular crowding. Bilateral interstitial opacities. Blunting of the bilateral costophrenic angles. No pneumothorax. Similar enlarged cardiomediastinal silhouette. Cervical spinal fixation hardware appears intact. Median sternotomy wires are nondisplaced. IMPRESSION: 1.  Low lung volumes with bronchovascular crowding. 2. Similar bilateral chronic-appearing interstitial opacities and reticulations. 3. Similar cardiomegaly. Electronically Signed   By: Agustin Cree M.D.   On: 06/21/2023 11:19        Scheduled Meds:  apixaban  5 mg Oral BID   atorvastatin  80 mg Oral Daily   clopidogrel  75 mg Oral Daily   escitalopram  10 mg Oral Daily   furosemide  60 mg Intravenous BID   gabapentin  300 mg Oral QHS   insulin aspart  0-15 Units Subcutaneous TID WC   insulin glargine-yfgn  10 Units Subcutaneous Daily   metoprolol tartrate  25 mg Oral Daily   pantoprazole  40 mg Oral BID   sodium chloride flush  3 mL Intravenous Q12H    traZODone  75 mg Oral QHS   Continuous Infusions:   LOS: 0 days    Time spent: 35 minutes.    [name], MD  [facility] Pager #: [phone] 7PM-7AM contact night coverage as above

## 2023-06-22 NOTE — Plan of Care (Signed)

## 2023-06-22 NOTE — Progress Notes (Signed)
Echocardiogram 2D Echocardiogram has been performed.  Warren Lacy Matheus Spiker RDCS 06/22/2023, 10:09 AM

## 2023-06-22 NOTE — TOC Initial Note (Signed)
Transition of Care [facility]) - Initial/Assessment Note    Patient Details  Name: Shane [name] MRN: [id number] Date of Birth: 1941/05/08  [facility] CM/SW Contact:    Leone Haven, RN Phone Number: 06/22/2023, 3:32 PM  Clinical Narrative:                 From home with wife, he has PCP, Dr. Jacky Kindle , he has insurance on file.  He states he does not have any HH services at this time in place.  He does have a scooter, w/chair, walker, shower with rails and a ramp.  He states a family member will transport him home at [employer], and his family is his support system.  [facility] [address], pta he was walking with his prosthetic ane a walker.   Expected Discharge Plan: Home w Home Health Services Barriers to Discharge: Continued Medical Work up   Patient Goals and CMS Choice Patient states their goals for this hospitalization and ongoing recovery are:: return home with wife   Choice offered to / list presented to : NA      Expected Discharge Plan and Services In-house Referral: NA Discharge Planning Services: CM Consult Post Acute Care Choice: NA Living arrangements for the past 2 months: Single Family Home                 DME Arranged: N/A DME Agency: NA       HH Arranged: NA          Prior Living Arrangements/Services Living arrangements for the past 2 months: Single Family Home Lives with:: Spouse Patient language and need for interpreter reviewed:: Yes Do you feel safe going back to the place where you live?: Yes      Need for Family Participation in Patient Care: Yes (Comment) Care giver support system in place?: Yes (comment) Current home services: DME (scooter, w/chair, walker, shower with rails, and a ramp) Criminal Activity/Legal Involvement Pertinent to Current Situation/Hospitalization: No - Comment as needed  Activities of Daily Living Home Assistive Devices/Equipment: Wheelchair, Environmental consultant (specify type), Other  (Comment) (home ramp, shower chair) ADL Screening (condition at time of admission) Patient's cognitive ability adequate to safely complete daily activities?: Yes Is the patient deaf or have difficulty hearing?: No Does the patient have difficulty seeing, even when wearing glasses/contacts?: No Does the patient have difficulty concentrating, remembering, or making decisions?: No Patient able to express need for assistance with ADLs?: Yes Does the patient have difficulty dressing or bathing?: No Independently performs ADLs?: No Communication: Independent Dressing (OT): Independent Grooming: Independent Feeding: Independent Bathing: Needs assistance Is this a change from baseline?: Change from baseline, expected to last <3 days Toileting: Needs assistance Is this a change from baseline?: Change from baseline, expected to last <3 days In/Out Bed: Needs assistance Is this a change from baseline?: Change from baseline, expected to last <3 days Does the patient have difficulty walking or climbing stairs?: Yes Weakness of Legs: Both Weakness of Arms/Hands: None  Permission Sought/Granted Permission sought to share information with : Case Manager Permission granted to share information with : Yes, Verbal Permission Granted              Emotional Assessment Appearance:: Appears stated age Attitude/Demeanor/Rapport: Engaged Affect (typically observed): Appropriate Orientation: : Oriented to Self, Oriented to Place, Oriented to  Time, Oriented to Situation Alcohol / Substance Use: Not Applicable Psych Involvement: No (comment)  Admission diagnosis:  Shortness of breath [R06.02]  Acute on chronic systolic (congestive) heart failure (HCC) [I50.23] Acute on chronic combined systolic and diastolic CHF, NYHA class 1 (HCC) [I50.43] Patient Active Problem List   Diagnosis Date Noted   Acute on chronic combined systolic and diastolic CHF, NYHA class 1 (HCC) 06/22/2023   Acute on chronic  systolic (congestive) heart failure (HCC) 06/21/2023   Intermediate stage nonexudative age-related macular degeneration of both eyes 04/25/2022   Research study patient 12/24/2021   Labile blood glucose    Pulmonary fibrosis (HCC)    Hyponatremia    S/P BKA (below knee amputation) unilateral, right (HCC)    Shortness of breath    Postoperative pain    Atherosclerosis of native arteries of extremities with gangrene, right leg (HCC)    Hardware complicating wound infection (HCC)    Abscess of bursa of right ankle 02/15/2021   Endocarditis 01/19/2021   Peripheral vascular disease (HCC) 12/29/2020   Chronic systolic CHF (congestive heart failure) (HCC) 12/22/2020   History of COVID-19 12/22/2020   SSS (sick sinus syndrome) (HCC) 12/22/2020   Acute bacterial endocarditis    MSSA bacteremia 12/09/2020   Diabetic foot ulcer (HCC) 12/09/2020   Foot drop, right foot    Generalized weakness 12/08/2020   Atrial fibrillation, chronic (HCC) 12/08/2020   Elevated troponin level not due myocardial infarction 12/08/2020   Adrenal insufficiency (HCC) 11/14/2020   Orthostatic hypotension 11/14/2020   Physical deconditioning 11/14/2020   Difficulty sleeping 11/07/2020   Right ventricular dysfunction 11/05/2020   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/04/2020   Hearing loss 11/03/2020   Cardiac device in situ 09/09/2020   Coronary artery disease involving native heart without angina pectoris 07/10/2020   Chronic kidney disease, stage 3a (HCC) 03/29/2020   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/28/2020   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) 03/16/2020   Right epiretinal membrane 03/16/2020   Posterior vitreous detachment of right eye 03/16/2020   Aortic stenosis, moderate 03/12/2020   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   Near syncope 04/27/2018   Hypoglycemia due to insulin 01/06/2018   Essential hypertension 01/06/2018   Syncope and collapse 01/05/2018    Encounter for therapeutic drug monitoring 06/29/2017   OSA on CPAP 05/05/2017   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HCC) 01/05/2017   Abnormal chest x-ray 10/12/2016   Benign neoplasm of colon 01/14/2013   PCP:  [name], MD Pharmacy:   [phone] [place], [place] - [address] [facility] [address] [place] [address] Phone: [phone] Fax: [phone]  [facility] [place] [place] - [address] [place] [address] Phone: [phone] Fax: [phone]     Social Determinants of Health (SDOH) Social History: SDOH Screenings   Food Insecurity: No Food Insecurity (06/21/2023)  Housing: Low Risk  (06/21/2023)  Transportation Needs: No Transportation Needs (06/21/2023)  Utilities: Not At Risk (06/21/2023)  Depression (PHQ2-9): Low Risk  (01/11/2021)  Tobacco Use: Low Risk  (06/21/2023)   SDOH Interventions:     Readmission Risk Interventions     No data to display

## 2023-06-23 DIAGNOSIS — I5033 Acute on chronic diastolic (congestive) heart failure: Secondary | ICD-10-CM

## 2023-06-23 DIAGNOSIS — I350 Nonrheumatic aortic (valve) stenosis: Secondary | ICD-10-CM

## 2023-06-23 DIAGNOSIS — I5023 Acute on chronic systolic (congestive) heart failure: Secondary | ICD-10-CM | POA: Diagnosis not present

## 2023-06-23 LAB — RENAL FUNCTION PANEL
Albumin: 2.9 g/dL — ABNORMAL LOW (ref 3.5–5.0)
Anion gap: 11 (ref 5–15)
BUN: 37 mg/dL — ABNORMAL HIGH (ref 8–23)
CO2: 29 mmol/L (ref 22–32)
Calcium: 8.9 mg/dL (ref 8.9–10.3)
Chloride: 97 mmol/L — ABNORMAL LOW (ref 98–111)
Creatinine, Ser: 1.87 mg/dL — ABNORMAL HIGH (ref 0.61–1.24)
GFR, Estimated: 35 mL/min — ABNORMAL LOW (ref >60)
Glucose, Bld: 172 mg/dL — ABNORMAL HIGH (ref 70–99)
Phosphorus: 3.6 mg/dL (ref 2.5–4.6)
Potassium: 3.7 mmol/L (ref 3.5–5.1)
Sodium: 137 mmol/L (ref 135–145)

## 2023-06-23 LAB — GLUCOSE, CAPILLARY
Glucose-Capillary: 158 mg/dL — ABNORMAL HIGH (ref 70–99)
Glucose-Capillary: 180 mg/dL — ABNORMAL HIGH (ref 70–99)
Glucose-Capillary: 211 mg/dL — ABNORMAL HIGH (ref 70–99)
Glucose-Capillary: 125 mg/dL — ABNORMAL HIGH (ref 70–99)

## 2023-06-23 LAB — MAGNESIUM: Magnesium: 2 mg/dL (ref 1.7–2.4)

## 2023-06-23 MED ORDER — FUROSEMIDE 10 MG/ML IJ SOLN
40.0000 mg | Freq: Two times a day (BID) | INTRAMUSCULAR | Status: DC
  Administered 2023-06-23: 40 mg via INTRAVENOUS

## 2023-06-23 NOTE — Plan of Care (Signed)
?  Problem: Education: ?Goal: Ability to verbalize understanding of medication therapies will improve ?Outcome: Progressing ?  ?Problem: Activity: ?Goal: Capacity to carry out activities will improve ?Outcome: Progressing ?  ?Problem: Cardiac: ?Goal: Ability to achieve and maintain adequate cardiopulmonary perfusion will improve ?Outcome: Progressing ?  ?

## 2023-06-23 NOTE — Consult Note (Signed)
Cardiology Consultation   Patient ID: Shane [name] MRN: [id number]; DOB: Apr 23, 1941  Admit date: 06/21/2023 Date of Consult: 06/23/2023  PCP:  [name], MD   [facility] Providers Cardiologist:  Olga Millers, MD  Electrophysiologist:  Lewayne Bunting, MD       Patient Profile:   Shane [name] is a 82 y.o. male with a hx of IPF, AS, CAD, PVDz who is being seen 06/23/2023 for the evaluation of diastolic heart failure at the request of Dr. Sharolyn Douglas.  History of Present Illness:   Shane [name] is well known to me. He has a h/o heart block and underwent DDD PM insertion in the past. He got cellulitis and had failure to heal and a right bka and left transmet amputation. He sees Dr. Dalbert Mayotte for IPF. He is not on oxygen. He got progressively sob and presented with volume overload. Repeat echo showed an EF of 50-55% and mean aortic valve gradient of 33. He has not had syncope and denies chest pain. He has been treated with IV diuresis. His weight is down but creatinine started bump. He feels like he is back to baseline.    Past Medical History:  Diagnosis Date   Acute blood loss anemia    Anxiety    AV block, Mobitz 1    Cataract    Chronic kidney disease    d/t DM   CKD (chronic kidney disease), stage III (HCC)    COVID-19 virus infection 07/18/2020   Last Assessment & Plan:   Formatting of this note might be different from the original.  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 10/2019 Echo: EF 55-60%,  Gr2 DD. Nl RV.    Neuromuscular disorder (HCC)    Neuropathy associated with endocrine disorder (HCC)    Nonobstructive CAD (coronary artery disease)    a. 2012 Cath: mod, nonobs dzs; b. 10/2016 MV: EF 60%, no ischemia.   OSA on CPAP 05/05/2017   Unattended Home Sleep Test 7/2/813-AHI 38.6/hour, desaturation to 64%, body weight 261 pounds   PONV (postoperative nausea and vomiting)    Postoperative anemia due to acute blood loss 11/07/2020   Postoperative hemorrhagic shock 03/20/2021   Sleep apnea     uses cpap asked to bring mask and tubing    Past Surgical History:  Procedure Laterality Date   ABDOMINAL AORTOGRAM W/LOWER EXTREMITY N/A 12/10/2020   Procedure: ABDOMINAL AORTOGRAM W/LOWER EXTREMITY;  Surgeon: Leonie Douglas, MD;  Location: [facility] INVASIVE CV LAB;  Service: Cardiovascular;  Laterality: N/A;   AMPUTATION Right 01/22/2021   Procedure: 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;  Surgeon: [name], MD;  Location: [facility] OR;  Service: Orthopedics;  Laterality: Right;   AMPUTATION Right 03/17/2021   Procedure: RIGHT BELOW KNEE AMPUTATION;  Surgeon: [name], MD;  Location: [facility] OR;  Service: Orthopedics;  Laterality: Right;   ANKLE FUSION Right 01/22/2021   Procedure: RIGHT FOOT TIBIOCALCANEAL FUSION;  Surgeon: Lajoyce Corners,  Randa Evens, MD;  Location: [facility] OR;  Service: Orthopedics;  Laterality: Right;   ANTERIOR FUSION CERVICAL SPINE  2012   CARDIAC CATHETERIZATION  2011   CARDIAC CATHETERIZATION N/A 11/09/2016   Procedure: Right Heart Cath;  Surgeon: Lyn Records, MD;  Location: [facility] INVASIVE CV LAB;  Service: Cardiovascular;  Laterality: N/A;   carpel tunnel     left wrist   CATARACT EXTRACTION     CATARACT EXTRACTION W/ INTRAOCULAR LENS  IMPLANT, BILATERAL  2013   CERVICAL LAMINECTOMY  2012   COLONOSCOPY N/A 01/14/2013   Procedure: COLONOSCOPY;  Surgeon: Hilarie Fredrickson, MD;  Location: [facility] ENDOSCOPY;  Service: Endoscopy;  Laterality: N/A;   CORONARY ARTERY BYPASS GRAFT  11/04/2020   LIMA-LAD, SVG-OM1,  SVG-PDA (Dr Rochele [facility]) dc 11/18/2020   EYE SURGERY     I & D EXTREMITY Right 02/19/2021   Procedure: RIGHT ANKLE DEBRIDEMENT AND PLACEMENT ANTIBIOTIC BEADS;  Surgeon: [name], MD;  Location: [facility] OR;  Service: Orthopedics;  Laterality: Right;   KNEE SURGERY  1998   left   LEFT HEART CATH AND CORONARY ANGIOGRAPHY N/A 07/10/2020   Procedure: LEFT HEART CATH AND CORONARY ANGIOGRAPHY;  Surgeon: Tonny Bollman, MD;  Location: [facility] INVASIVE CV LAB;  Service: Cardiovascular;  Laterality: N/A;   LUMBAR LAMINECTOMY  2003   LUNG BIOPSY Left 12/26/2016   Procedure: LUNG BIOPSY;  Surgeon: Loreli Slot, MD;  Location: [facility] OR;  Service: Thoracic;  Laterality: Left;   PACEMAKER IMPLANT N/A 03/30/2020   Procedure: PACEMAKER IMPLANT;  Surgeon: Marinus Maw, MD;  Location: [facility] INVASIVE CV LAB;  Service: Cardiovascular;  Laterality: N/A;   PERIPHERAL VASCULAR INTERVENTION Right 12/10/2020   Procedure: PERIPHERAL VASCULAR INTERVENTION;  Surgeon: Leonie Douglas, MD;  Location: [facility]VAS[facility] CV LAB;  Service: Cardiovascular;  Laterality: Right;  SFA   POSTERIOR FUSION CERVICAL SPINE  2012   PPM GENERATOR REMOVAL N/A 12/14/2020   Procedure: PPM GENERATOR REMOVAL;  Surgeon: Marinus Maw, MD;  Location: [facility] INVASIVE CV LAB;  Service: Cardiovascular;  Laterality: N/A;   TEE WITHOUT CARDIOVERSION N/A 12/11/2020   Procedure: TRANSESOPHAGEAL ECHOCARDIOGRAM (TEE);  Surgeon: Sande Rives, MD;  Location: [facility] ENDOSCOPY;  Service: Cardiovascular;  Laterality: N/A;   TRIGGER FINGER RELEASE  2011   4th finger left hand   VIDEO ASSISTED THORACOSCOPY Left 12/26/2016   Procedure: VIDEO ASSISTED THORACOSCOPY;  Surgeon: Loreli Slot, MD;  Location: [facility] OR;  Service: Thoracic;  Laterality: Left;   VIDEO BRONCHOSCOPY N/A 12/26/2016   Procedure: VIDEO BRONCHOSCOPY;  Surgeon: Loreli Slot, MD;  Location: [facility] OR;  Service: Thoracic;  Laterality: N/A;     Home Medications:  Prior to Admission  medications   Medication Sig Start Date End Date Taking? Authorizing Provider  albuterol (PROVENTIL) (2.5 MG/3ML) 0.083% nebulizer solution Take 2.5 mg by nebulization 2 (two) times daily as needed for wheezing or shortness of breath.   Yes [provider]  albuterol (VENTOLIN HFA) 108 (90 Base) MCG/ACT inhaler Inhale 2 puffs into the lungs every 4 (four) hours as needed for wheezing or shortness of breath. Patient taking differently: Inhale 3 puffs into the lungs every 4 (four) hours as needed for wheezing or shortness of breath. 04/12/21  Yes Angiulli, Mcarthur Rossetti, PA-C  apixaban (ELIQUIS) 5 MG TABS tablet Take 1 tablet (5 mg total) by mouth 2 (two) times daily. 04/12/21  Yes Angiulli, Mcarthur Rossetti, PA-C  Ascorbic Acid (VITAMIN C WITH ROSE HIPS) 500 MG tablet Take  1 tablet (500 mg total) by mouth daily. 04/12/21  Yes Angiulli, Mcarthur Rossetti, PA-C  atorvastatin (LIPITOR) 80 MG tablet TAKE 1 TABLET BY MOUTH EVERY DAY Patient taking differently: Take 80 mg by mouth at bedtime. 07/25/22  Yes Lewayne Bunting, MD  clopidogrel (PLAVIX) 75 MG tablet Take 75 mg by mouth daily. 09/04/22  Yes [provider]  diphenhydrAMINE HCl, Sleep, (ZZZQUIL PO) Take 15 mLs by mouth at bedtime as needed (cough).   Yes [provider]  docusate sodium (COLACE) 100 MG capsule Take 1 capsule (100 mg total) by mouth daily. Patient taking differently: Take 100 mg by mouth as needed for mild constipation or moderate constipation. 04/13/21  Yes Angiulli, Mcarthur Rossetti, PA-C  escitalopram (LEXAPRO) 10 MG tablet Take 1 tablet (10 mg total) by mouth daily. 04/12/21  Yes Angiulli, Mcarthur Rossetti, PA-C  FARXIGA 10 MG TABS tablet Take 10 mg by mouth daily.   Yes [provider]  furosemide (LASIX) 40 MG tablet Take 1 tablet (40 mg total) by mouth daily. Patient taking differently: Take 80 mg by mouth daily. 04/12/21  Yes Angiulli, Mcarthur Rossetti, PA-C  gabapentin (NEURONTIN) 300 MG capsule TAKE 1 CAPSULE BY MOUTH THREE TIMES A  DAY Patient taking differently: Take 300 mg by mouth at bedtime. 12/14/21  Yes Adonis Huguenin, NP  HUMALOG KWIKPEN 100 UNIT/ML KwikPen Inject 10-15 Units into the skin 3 (three) times daily. Depending what his blood sugar reading is   Yes [provider]  HYDROMET 5-1.5 MG/5ML syrup Take 5 mLs by mouth as needed for cough. 05/29/23  Yes [provider]  LORazepam (ATIVAN) 1 MG tablet Take 1 tablet (1 mg total) by mouth at bedtime. Patient taking differently: Take 1 mg by mouth at bedtime as needed (sleep). 04/12/21  Yes Angiulli, Mcarthur Rossetti, PA-C  metoprolol tartrate (LOPRESSOR) 25 MG tablet Take 25 mg by mouth daily.   Yes [provider]  Multiple Vitamin (MULTIVITAMIN WITH MINERALS) TABS tablet Take 1 tablet by mouth daily.   Yes [provider]  mupirocin ointment (BACTROBAN) 2 % Apply 1 Application topically at bedtime. 06/06/23  Yes [provider]  Olopatadine HCl (PATADAY OP) Place 2 drops into both eyes daily as needed (allergies).   Yes [provider]  ondansetron (ZOFRAN) 4 MG tablet Take 4 mg by mouth as needed for nausea or vomiting. 06/15/23  Yes [provider]  pantoprazole (PROTONIX) 40 MG tablet Take 1 tablet (40 mg total) by mouth 2 (two) times daily. Patient taking differently: Take 40 mg by mouth daily. 04/12/21  Yes Angiulli, Mcarthur Rossetti, PA-C  polyethylene glycol powder (GLYCOLAX/MIRALAX) 17 GM/SCOOP powder Take 17 g by mouth every other day. 11/15/20  Yes [provider]  traZODone (DESYREL) 50 MG tablet Take 75 mg by mouth at bedtime as needed for sleep. 05/11/23  Yes [provider]  TRESIBA FLEXTOUCH 100 UNIT/ML FlexTouch Pen Inject 20 Units into the skin daily. 12/22/22  Yes [provider]  zinc sulfate 220 (50 Zn) MG capsule Take 1 capsule (220 mg total) by mouth daily. 04/12/21  Yes Angiulli, Mcarthur Rossetti, PA-C  fluocinonide (LIDEX) 0.05 % external solution SMARTSIG:1 Sparingly Topical Every Night  PRN Patient not taking: Reported on 06/21/2023 05/24/23   [provider]  metoprolol tartrate (LOPRESSOR) 50 MG tablet Take 0.5 tablets (25 mg total) by mouth daily. Patient not taking: Reported on 06/21/2023 04/12/21   Charlton Amor, PA-C    Inpatient Medications: Scheduled Meds:  apixaban  5 mg Oral BID   atorvastatin  80 mg Oral Daily   clopidogrel  75 mg Oral Daily   escitalopram  10 mg Oral Daily   gabapentin  300 mg Oral QHS   insulin aspart  0-15 Units Subcutaneous TID WC   insulin glargine-yfgn  10 Units Subcutaneous Daily   metoprolol tartrate  25 mg Oral Daily   pantoprazole  40 mg Oral BID   sodium chloride flush  3 mL Intravenous Q12H   traZODone  75 mg Oral QHS   Continuous Infusions:  PRN Meds: acetaminophen **OR** acetaminophen, albuterol, polyethylene glycol  Allergies:    Allergies  Allergen Reactions   Codeine Hives and Itching   Ofev [Nintedanib] Diarrhea    SEVERE DIARRHEA   Pirfenidone Diarrhea and Other (See Comments)    Esbriet (Pirfenidone) causes elevated LFTs. 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    Social Determinants of Health   Financial Resource Strain: Not on file  Food Insecurity: No Food Insecurity (06/21/2023)   Hunger Vital Sign    Worried About Running Out of Food in the Last Year: Never true    Ran Out of Food in the Last Year: Never true  Transportation Needs: No  Transportation Needs (06/21/2023)   PRAPARE - Administrator, Civil Service (Medical): No    Lack of Transportation (Non-Medical): No  Physical Activity: Not on file  Stress: Not on file  Social Connections: Not on file  Intimate Partner Violence: Not At Risk (06/21/2023)   Humiliation, Afraid, Rape, and Kick questionnaire    Fear of Current or Ex-Partner: No    Emotionally Abused: No    Physically Abused: No    Sexually Abused: No    Family History:    Family History  Problem Relation Age of Onset   Diabetes Mellitus II Mother    Emphysema Father 87   Heart attack Father    Colon cancer Neg Hx    Esophageal cancer Neg Hx    Rectal cancer Neg Hx    Stomach cancer Neg Hx      ROS:  Please see the history of present illness.   All other ROS reviewed and negative.     Physical Exam/Data:   Vitals:   06/23/23 0907 06/23/23 1133 06/23/23 1200 06/23/23 1631  BP:  (!) 114/56  118/62  Pulse: 60 (!) 59  (!) 59  Resp:  18  16  Temp:  98.2 F (36.8 C)  97.8 F (36.6 C)  TempSrc:    Oral  SpO2:  97% 97% 90%  Weight:      Height:        Intake/Output Summary (Last 24 hours) at 06/23/2023 1735 Last data filed at 06/23/2023 1500 Gross per 24 hour  Intake  360 ml  Output 1550 ml  Net -1190 ml      06/23/2023    4:31 AM 06/22/2023    5:07 AM 06/21/2023   10:08 AM  Last 3 Weights  Weight (lbs) 233 lb 0.4 oz 234 lb 9.1 oz 237 lb  Weight (kg) 105.7 kg 106.4 kg 107.502 kg     Body mass index is 31.6 kg/m.  General:  Well nourished, well developed, in no acute distress HEENT: normal Neck: 7 cm JVD Vascular: No carotid bruits; Distal pulses 2+ bilaterally Cardiac:  normal S1, S2 is decreased; RRR; 2/6 systolic murmur  Lungs:  clear to auscultation bilaterally, no wheezing, rhonchi or rale Abd: soft, nontender, no hepatomegaly  Ext: no edema. Right bka and left transmet. Musculoskeletal:  No deformities, BUE and BLE strength normal and equal Skin: warm and dry   Neuro:  CNs 2-12 intact, no focal abnormalities noted Psych:  Normal affect   EKG:  The EKG was personally reviewed and demonstrates:  P synch ventricular pacing Telemetry:  Telemetry was personally reviewed and demonstrates:  P sync ventric pacing  Relevant CV Studies: 2D echo reviewed  Laboratory Data:  High Sensitivity Troponin:   Recent Labs  Lab 06/21/23 1027 06/21/23 1330  TROPONINIHS 40* 41*     Chemistry Recent Labs  Lab 06/21/23 1027 06/21/23 1330 06/22/23 0232 06/22/23 2341  NA 134*  --  135 137  K 3.8  --  3.8 3.7  CL 97*  --  100 97*  CO2 25  --  28 29  GLUCOSE 145*  --  151* 172*  BUN 40*  --  37* 37*  CREATININE 1.59*  --  1.82* 1.87*  CALCIUM 8.8*  --  8.4* 8.9  MG  --  1.9  --  2.0  GFRNONAA 43*  --  37* 35*  ANIONGAP 12  --  7 11    Recent Labs  Lab 06/21/23 1027 06/22/23 0232 06/22/23 2341  PROT 7.0 6.3*  --   ALBUMIN 3.1* 2.8* 2.9*  AST 19 17  --   ALT 21 18  --   ALKPHOS 121 98  --   BILITOT 0.6 0.9  --    Lipids No results for input(s): "CHOL", "TRIG", "HDL", "LABVLDL", "LDLCALC", "CHOLHDL" in the last 168 hours.  Hematology Recent Labs  Lab 06/21/23 1027 06/22/23 0232  WBC 6.0 6.5  RBC 4.67 4.33  HGB 11.6* 10.7*  HCT 38.1* 34.5*  MCV 81.6 79.7*  MCH 24.8* 24.7*  MCHC 30.4 31.0  RDW 17.2* 17.0*  PLT 185 186   Thyroid No results for input(s): "TSH", "FREET4" in the last 168 hours.  BNP Recent Labs  Lab 06/21/23 1027  BNP 248.7*    DDimer No results for input(s): "DDIMER" in the last 168 hours.   Radiology/Studies:  ECHOCARDIOGRAM COMPLETE  Result Date: 06/22/2023    ECHOCARDIOGRAM REPORT   Patient Name:   Shane [name] Date of Exam: 06/22/2023 Medical Rec #:  [id number]        Height:       72.0 in Accession #:    [id number]       Weight:       234.6 lb Date of Birth:  1941/01/29        BSA:          2.280 m Patient Age:    82 years         BP:  141/67 mmHg Patient Gender: M                HR:           61  bpm. Exam Location:  Inpatient Procedure: 2D Echo, Color Doppler, Cardiac Doppler and Intracardiac            Opacification Agent Indications:    I50.21 Acute systolic (congestive) heart failure  History:        Patient has prior history of Echocardiogram examinations, most                 recent 05/17/2022. CHF, Prior CABG and Pacemaker; Risk                 Factors:Hypertension, Diabetes, Dyslipidemia and Sleep Apnea.  Sonographer:    Irving Burton Senior RDCS Referring Phys: [id number] Cecille Po MELVIN  Sonographer Comments: Technically difficult study due to poor echo windows. Apicals too lateral to turn on left side. IMPRESSIONS  1. Left ventricular ejection fraction, by estimation, is 50 to 55%. The left ventricle has low normal function. Left ventricular endocardial border not optimally defined to evaluate regional wall motion. There is mild concentric left ventricular hypertrophy. Left ventricular diastolic parameters are consistent with Grade III diastolic dysfunction (restrictive).  2. Right ventricular systolic function is moderately reduced. The right ventricular size is not well visualized. Tricuspid regurgitation signal is inadequate for assessing PA pressure.  3. Left atrial size was severely dilated.  4. Right atrial size was moderately dilated.  5. The mitral valve is normal in structure. Trivial mitral valve regurgitation.  6. The aortic valve is tricuspid. There is severe calcifcation of the aortic valve. Aortic valve regurgitation is not visualized. Moderate to severe aortic valve stenosis. Aortic valve area, by VTI measures 0.91 cm. Aortic valve mean gradient measures 33.0 mmHg. Aortic valve Vmax measures 3.62 m/s.  7. Aortic dilatation noted. There is mild dilatation of the ascending aorta, measuring 42 mm. There is borderline dilatation of the aortic root, measuring 38 mm.  8. The inferior vena cava is dilated in size with <50% respiratory variability, suggesting right atrial pressure of 15 mmHg.  Conclusion(s)/Recommendation(s): The AS gradient has increased since previous study now moderate to severe. FINDINGS  Left Ventricle: Left ventricular ejection fraction, by estimation, is 50 to 55%. The left ventricle has low normal function. Left ventricular endocardial border not optimally defined to evaluate regional wall motion. Definity contrast agent was given IV  to delineate the left ventricular endocardial borders. The left ventricular internal cavity size was normal in size. There is mild concentric left ventricular hypertrophy. Abnormal (paradoxical) septal motion consistent with post-operative status. Left ventricular diastolic parameters are consistent with Grade III diastolic dysfunction (restrictive). Right Ventricle: The right ventricular size is not well visualized. Right vetricular wall thickness was not well visualized. Right ventricular systolic function is moderately reduced. Tricuspid regurgitation signal is inadequate for assessing PA pressure. Left Atrium: Left atrial size was severely dilated. Right Atrium: Right atrial size was moderately dilated. Pericardium: There is no evidence of pericardial effusion. Mitral Valve: The mitral valve is normal in structure. Trivial mitral valve regurgitation. Tricuspid Valve: The tricuspid valve is normal in structure. Tricuspid valve regurgitation is mild. Aortic Valve: The aortic valve is tricuspid. There is severe calcifcation of the aortic valve. Aortic valve regurgitation is not visualized. Moderate to severe aortic stenosis is present. Aortic valve mean gradient measures 33.0 mmHg. Aortic valve peak gradient measures 52.4 mmHg. Aortic valve area, by VTI measures 0.91  cm. Pulmonic Valve: The pulmonic valve was grossly normal. Pulmonic valve regurgitation is trivial. Aorta: Aortic dilatation noted. There is mild dilatation of the ascending aorta, measuring 42 mm. There is borderline dilatation of the aortic root, measuring 38 mm. Venous: The inferior  vena cava is dilated in size with less than 50% respiratory variability, suggesting right atrial pressure of 15 mmHg. IAS/Shunts: No atrial level shunt detected by color flow Doppler. Additional Comments: A device lead is visualized in the right ventricle.  LEFT VENTRICLE PLAX 2D LVIDd:         5.20 cm   Diastology LVIDs:         3.90 cm   LV e' medial:    7.15 cm/s LV PW:         1.10 cm   LV E/e' medial:  17.1 LV IVS:        1.20 cm   LV e' lateral:   7.93 cm/s LVOT diam:     2.50 cm   LV E/e' lateral: 15.4 LV SV:         77 LV SV Index:   34 LVOT Area:     4.91 cm  RIGHT VENTRICLE RV S prime:     7.46 cm/s TAPSE (M-mode): 1.0 cm LEFT ATRIUM              Index        RIGHT ATRIUM           Index LA diam:        3.90 cm  1.71 cm/m   RA Area:     22.70 cm LA Vol (A2C):   112.0 ml 49.12 ml/m  RA Volume:   68.40 ml  30.00 ml/m LA Vol (A4C):   94.0 ml  41.23 ml/m LA Biplane Vol: 112.0 ml 49.12 ml/m  AORTIC VALVE AV Area (Vmax):    0.94 cm AV Area (Vmean):   0.99 cm AV Area (VTI):     0.91 cm AV Vmax:           362.00 cm/s AV Vmean:          268.000 cm/s AV VTI:            0.846 m AV Peak Grad:      52.4 mmHg AV Mean Grad:      33.0 mmHg LVOT Vmax:         69.00 cm/s LVOT Vmean:        54.200 cm/s LVOT VTI:          0.156 m LVOT/AV VTI ratio: 0.18  AORTA Ao Root diam: 3.80 cm Ao Asc diam:  3.85 cm MITRAL VALVE MV Area (PHT): 5.31 cm     SHUNTS MV Decel Time: 143 msec     Systemic VTI:  0.16 m MV E velocity: 122.00 cm/s  Systemic Diam: 2.50 cm MV A velocity: 67.50 cm/s MV E/A ratio:  1.81 Arvilla Meres MD Electronically signed by Arvilla Meres MD Signature Date/Time: 06/22/2023/12:27:58 PM    Final    DG Foot Complete Left  Result Date: 06/21/2023 CLINICAL DATA:  r/o osteomyelitis EXAM: LEFT FOOT - COMPLETE 3+ VIEW COMPARISON:  Radiograph from 04/23/2021 FINDINGS: Bone mineralization within normal limits for patient's age. 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 The  resection margins are sharp. No focal bone erosions. No evidence of air within the overlying soft tissue stump. No radiographic evidence of acute osteomyelitis. Correlate clinically to determine the need for additional imaging  with more sensitive modality such as MRI. No acute fracture or dislocation. No aggressive osseous lesion. Mild degenerative osteoarthritis of imaged joints. Calcaneal spur noted along the Achilles tendon and Plantar aponeurosis attachment sites. No focal soft tissue swelling. No radiopaque foreign bodies. 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 No radiographic evidence of acute osteomyelitis. Electronically Signed   By: Jules Schick M.D.   On: 06/21/2023 11:22   DG Chest 2 View  Result Date: 06/21/2023 CLINICAL DATA:  Shortness of breath EXAM: CHEST - 2 VIEW COMPARISON:  Chest radiograph dated 03/24/2021 FINDINGS: Low lung volumes with bronchovascular crowding. Bilateral interstitial opacities. Blunting of the bilateral costophrenic angles. No pneumothorax. Similar enlarged cardiomediastinal silhouette. Cervical spinal fixation hardware appears intact. Median sternotomy wires are nondisplaced. IMPRESSION: 1.  Low lung volumes with bronchovascular crowding. 2. Similar bilateral chronic-appearing interstitial opacities and reticulations. 3. Similar cardiomegaly. Electronically Signed   By: Agustin Cree M.D.   On: 06/21/2023 11:19     Assessment and Plan:   Acute on chronic diastolic heart failure - He has been diuresed and now creatinine up. Stop IV lasix as you have done. Start lasix 60 mg po tomorrow. Ok to DC tomorrow if stable. I will arrange followup in our CHF clinic with Dr. Shirlee Latch. AS - he is not yet bad enough for TAVR. I have some concern about his access. He may need referral to or TAVR clinic for evaluation in the coming months.   Risk Assessment/Risk Scores:        For questions or updates, please contact [facility] Please consult [email or web address] for contact info under    Signed, Lewayne Bunting, MD  06/23/2023 5:35 PM

## 2023-06-23 NOTE — Progress Notes (Signed)
   06/22/23 2327  BiPAP/CPAP/SIPAP  $ Non-Invasive Home Ventilator  Subsequent  BiPAP/CPAP/SIPAP Pt Type Adult  BiPAP/CPAP/SIPAP Resmed  Mask Type Nasal pillows  FiO2 (%) 21 %  Patient Home Equipment Yes

## 2023-06-23 NOTE — Progress Notes (Signed)
PROGRESS NOTE    Shane [name]  VHQ:[id number] DOB: 06-19-1941 DOA: 06/21/2023 PCP: [name], MD  Outpatient Specialists:    Brief Narrative:  Patient is an 82 year old male with past medical history significant for hypertension, diabetes mellitus, chronic kidney disease stage III, coronary artery disease status post CABG, PVD, OSA on CPAP, chronic congestive heart failure, status post right BKA, atrial fibrillation, sick sinus syndrome s/p pacemaker placement, pulmonary fibrosis and adrenal insufficiency.  Patient was admitted with acute on chronic systolic congestive heart failure.   Today, pt denies any new complaints. SOB has improved from admission, but still intermittently sob on ambulation. Discussed with patient and wife at bedside. Needs to ambulate and monitor O2 sats. Cr today at 1.87. 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)   Chronic kidney disease, stage 3a (HCC)   Coronary artery disease involving native heart without angina pectoris   Cardiac device in situ   S/P CABG x 3   SSS (sick sinus syndrome) (HCC)   Peripheral vascular disease (HCC)   S/P BKA (below knee amputation) unilateral, right (HCC)   Acute on chronic combined systolic and diastolic CHF, NYHA class 1 (HCC)   Acute on chronic systolic CHF Echo done with improving EF of 50-55%, grade III DD, with  BNP elevated but still somewhat borderline at 248 Chest x-ray showing similar chronic interstitial opacities and reticular changes Started with 60 mg IV Lasix twice daily, on hold for now due to rise in Cr Strict I's and O's, daily weights Daily BMP Continue home metoprolol, Farxiga Cardiology  consulted  Mod-severe aortic valve stenosis Cardiology consulted   Pulmonary fibrosis Currently on supportive care only per recent pulmonology note, had failed multiple trials On RA Supportive care As needed albuterol   Atrial fibrillation Sick sinus syndrome Heart block Status post pacemaker Currently paced rhythm Continue home metoprolol, Eliquis   OSA Continue home CPAP    CAD Status post CABG x 3 Chest pain free Troponin 40-->41 Continue home Plavix, metoprolol, atorvastatin, Eliquis   PVD Continue home Plavix, Eliquis, atorvastatin   CKD 3a Creatinine with mild rise 1.87, held lasix, last received 7/19 am Monitor closely    Hypertension Continue metoprolol   Diabetes mellitus type 2 A1c 7.8 on 02/15/21, repeat pending SSI, accucheck, hypoglycemic protocol   Status post right BKA Status post left transmetatarsal amputation    DVT prophylaxis: Eliquis. Code Status: DO NOT RESUSCITATE.  Intubation is okay. Family Communication: Discussed with wife at bedside Disposition Plan: Likely discharge back home eventually.   Consultants:  Cardiology  Procedures:  None  Antimicrobials:  None.   Objective: Vitals:   06/23/23 0903 06/23/23 0907 06/23/23 1133 06/23/23 1200  BP: (!) 123/58  (!) 114/56   Pulse: (!) 59 60 (!) 59   Resp: 16  18   Temp: 98.5 F (36.9 C)  98.2 F (36.8 C)   TempSrc: Oral     SpO2: 94%  97% 97%  Weight:      Height:        Intake/Output Summary (Last 24 hours) at 06/23/2023 1615 Last data filed at 06/23/2023 1500 Gross per 24 hour  Intake 360 ml  Output 1550 ml  Net -1190 ml   Filed Weights   06/21/23 1008 06/22/23 0507 06/23/23 0431  Weight: 107.5 kg 106.4 kg 105.7 kg    Examination: General: NAD  Cardiovascular: S1, S2 present Respiratory: CTAB Abdomen: Soft, nontender, nondistended, bowel sounds present Musculoskeletal: bilateral pedal edema noted, s/p R BKA Skin: Normal Psychiatry: Normal mood    Data  Reviewed: I have personally reviewed following labs and imaging studies  CBC: Recent Labs  Lab 06/21/23 1027 06/22/23 0232  WBC 6.0 6.5  NEUTROABS 3.7  --   HGB 11.6* 10.7*  HCT 38.1* 34.5*  MCV 81.6 79.7*  PLT 185 186   Basic Metabolic Panel: Recent Labs  Lab 06/21/23 1027 06/21/23 1330 06/22/23 0232 06/22/23 2341  NA 134*  --  135 137  K 3.8  --  3.8 3.7  CL 97*  --  100 97*  CO2 25  --  28 29  GLUCOSE 145*  --  151* 172*  BUN 40*  --  37* 37*  CREATININE 1.59*  --  1.82* 1.87*  CALCIUM 8.8*  --  8.4* 8.9  MG  --  1.9  --  2.0  PHOS  --   --   --  3.6   GFR: Estimated Creatinine Clearance: 38.3 mL/min (A) (by C-G formula based on SCr of 1.87 mg/dL (H)). Liver Function Tests: Recent Labs  Lab 06/21/23 1027 06/22/23 0232 06/22/23 2341  AST 19 17  --   ALT 21 18  --   ALKPHOS 121 98  --   BILITOT 0.6 0.9  --   PROT 7.0 6.3*  --   ALBUMIN 3.1* 2.8* 2.9*   No results for input(s): "LIPASE", "AMYLASE" in the last 168 hours. No results for input(s): "AMMONIA" in the last 168 hours. Coagulation Profile: No results for input(s): "INR", "PROTIME" in the last 168 hours. Cardiac Enzymes: No results for input(s): "CKTOTAL", "CKMB", "CKMBINDEX", "TROPONINI" in the last 168 hours. BNP (last 3 results) No results for input(s): "PROBNP" in the last 8760 hours. HbA1C: No results for input(s): "HGBA1C" in the last 72 hours. CBG: Recent Labs  Lab 06/22/23 1117 06/22/23 1552 06/22/23 2058 06/23/23 0604 06/23/23 1130  GLUCAP 271* 179* 199* 158* 180*   Lipid Profile: No results for input(s): "CHOL", "HDL", "LDLCALC", "TRIG", "CHOLHDL", "LDLDIRECT" in the last 72 hours. Thyroid Function Tests: No results for input(s): "TSH", "T4TOTAL", "FREET4", "T3FREE", "THYROIDAB" in the last 72 hours. Anemia Panel: No results for input(s): "VITAMINB12", "FOLATE", "FERRITIN", "TIBC", "IRON", "RETICCTPCT" in the last 72 hours. Urine analysis:    Component Value Date/Time    COLORURINE YELLOW 12/08/2020 1954   APPEARANCEUR CLEAR 12/08/2020 1954   LABSPEC 1.014 12/08/2020 1954   PHURINE 5.0 12/08/2020 1954   GLUCOSEU NEGATIVE 12/08/2020 1954   HGBUR NEGATIVE 12/08/2020 1954   BILIRUBINUR NEGATIVE 12/08/2020 1954   KETONESUR NEGATIVE 12/08/2020 1954   PROTEINUR 100 (A) 12/08/2020 1954   UROBILINOGEN 1.0 09/27/2011 1112   NITRITE NEGATIVE 12/08/2020 1954   LEUKOCYTESUR NEGATIVE 12/08/2020 1954   Sepsis Labs: @LABRCNTIP (procalcitonin:4,lacticidven:4)  ) Recent Results (from the past 240 hour(s))  SARS Coronavirus 2 by RT PCR (hospital order, performed in [facility] hospital lab) *cepheid single result test* Anterior Nasal Swab     Status: None   Collection Time: 06/21/23  1:19 PM   Specimen: Anterior Nasal Swab  Result Value Ref Range Status   SARS Coronavirus 2 by RT PCR NEGATIVE NEGATIVE Final    Comment: Performed at [facility], [address], [place], [place] [address]         Radiology Studies: ECHOCARDIOGRAM COMPLETE  Result Date: 06/22/2023    ECHOCARDIOGRAM REPORT   Patient Name:   CAPTAIN BLUCHER Date of Exam: 06/22/2023 Medical Rec #:  [id number]        Height:       72.0 in Accession #:    [id number]       Weight:       234.6 lb Date of Birth:  10-Apr-1941        BSA:          2.280 m Patient Age:    82 years         BP:           141/67 mmHg Patient Gender: M                HR:           61 bpm. Exam Location:  Inpatient Procedure: 2D Echo, Color Doppler, Cardiac Doppler and Intracardiac            Opacification Agent Indications:    I50.21 Acute systolic (congestive) heart failure  History:        Patient has prior history of Echocardiogram examinations, most                 recent 05/17/2022. CHF, Prior CABG and Pacemaker; Risk                 Factors:Hypertension, Diabetes, Dyslipidemia and Sleep Apnea.  Sonographer:    Irving Burton Senior RDCS Referring Phys: [id number] Cecille Po MELVIN  Sonographer Comments: Technically difficult study due to  poor echo windows. Apicals too lateral to turn on left side. IMPRESSIONS  1. Left ventricular ejection fraction, by estimation, is 50 to 55%. The left ventricle has low normal function. Left ventricular endocardial border not optimally defined to evaluate regional wall motion. There is mild concentric left ventricular hypertrophy. Left ventricular diastolic parameters are consistent with Grade III diastolic dysfunction (restrictive).  2. Right ventricular systolic function is moderately reduced. The right ventricular size is not well visualized. Tricuspid regurgitation signal is inadequate for assessing PA pressure.  3. Left atrial size was severely dilated.  4. Right atrial size was moderately dilated.  5. The mitral valve is normal in structure. Trivial mitral valve regurgitation.  6. The aortic valve is tricuspid. There is severe calcifcation of the aortic valve. Aortic valve regurgitation is not visualized. Moderate to severe aortic valve stenosis. Aortic valve area, by VTI measures 0.91 cm. Aortic valve mean gradient measures 33.0 mmHg. Aortic valve Vmax measures 3.62 m/s.  7. Aortic dilatation noted. There is mild dilatation of the ascending aorta, measuring 42 mm. There is borderline dilatation of the aortic root, measuring 38 mm.  8. The inferior vena cava is dilated in size with <50% respiratory variability, suggesting right atrial pressure of 15 mmHg. Conclusion(s)/Recommendation(s): The AS gradient has increased since previous study now moderate to severe. FINDINGS  Left Ventricle: Left ventricular ejection fraction, by estimation, is 50 to 55%. The left ventricle has low normal function. Left ventricular endocardial border not optimally defined to evaluate regional wall motion. Definity contrast agent was given IV  to delineate the left ventricular endocardial borders. The left ventricular internal cavity size was normal in size. There is mild concentric left ventricular hypertrophy. Abnormal  (paradoxical) septal motion consistent with post-operative status. Left ventricular diastolic parameters are consistent with Grade III diastolic dysfunction (restrictive). Right Ventricle: The  right ventricular size is not well visualized. Right vetricular wall thickness was not well visualized. Right ventricular systolic function is moderately reduced. Tricuspid regurgitation signal is inadequate for assessing PA pressure. Left Atrium: Left atrial size was severely dilated. Right Atrium: Right atrial size was moderately dilated. Pericardium: There is no evidence of pericardial effusion. Mitral Valve: The mitral valve is normal in structure. Trivial mitral valve regurgitation. Tricuspid Valve: The tricuspid valve is normal in structure. Tricuspid valve regurgitation is mild. Aortic Valve: The aortic valve is tricuspid. There is severe calcifcation of the aortic valve. Aortic valve regurgitation is not visualized. Moderate to severe aortic stenosis is present. Aortic valve mean gradient measures 33.0 mmHg. Aortic valve peak gradient measures 52.4 mmHg. Aortic valve area, by VTI measures 0.91 cm. Pulmonic Valve: The pulmonic valve was grossly normal. Pulmonic valve regurgitation is trivial. Aorta: Aortic dilatation noted. There is mild dilatation of the ascending aorta, measuring 42 mm. There is borderline dilatation of the aortic root, measuring 38 mm. Venous: The inferior vena cava is dilated in size with less than 50% respiratory variability, suggesting right atrial pressure of 15 mmHg. IAS/Shunts: No atrial level shunt detected by color flow Doppler. Additional Comments: A device lead is visualized in the right ventricle.  LEFT VENTRICLE PLAX 2D LVIDd:         5.20 cm   Diastology LVIDs:         3.90 cm   LV e' medial:    7.15 cm/s LV PW:         1.10 cm   LV E/e' medial:  17.1 LV IVS:        1.20 cm   LV e' lateral:   7.93 cm/s LVOT diam:     2.50 cm   LV E/e' lateral: 15.4 LV SV:         77 LV SV Index:   34  LVOT Area:     4.91 cm  RIGHT VENTRICLE RV S prime:     7.46 cm/s TAPSE (M-mode): 1.0 cm LEFT ATRIUM              Index        RIGHT ATRIUM           Index LA diam:        3.90 cm  1.71 cm/m   RA Area:     22.70 cm LA Vol (A2C):   112.0 ml 49.12 ml/m  RA Volume:   68.40 ml  30.00 ml/m LA Vol (A4C):   94.0 ml  41.23 ml/m LA Biplane Vol: 112.0 ml 49.12 ml/m  AORTIC VALVE AV Area (Vmax):    0.94 cm AV Area (Vmean):   0.99 cm AV Area (VTI):     0.91 cm AV Vmax:           362.00 cm/s AV Vmean:          268.000 cm/s AV VTI:            0.846 m AV Peak Grad:      52.4 mmHg AV Mean Grad:      33.0 mmHg LVOT Vmax:         69.00 cm/s LVOT Vmean:        54.200 cm/s LVOT VTI:          0.156 m LVOT/AV VTI ratio: 0.18  AORTA Ao Root diam: 3.80 cm Ao Asc diam:  3.85 cm MITRAL VALVE MV Area (PHT): 5.31 cm     SHUNTS MV  Decel Time: 143 msec     Systemic VTI:  0.16 m MV E velocity: 122.00 cm/s  Systemic Diam: 2.50 cm MV A velocity: 67.50 cm/s MV E/A ratio:  1.81 Arvilla Meres MD Electronically signed by Arvilla Meres MD Signature Date/Time: 06/22/2023/12:27:58 PM    Final         Scheduled Meds:  apixaban  5 mg Oral BID   atorvastatin  80 mg Oral Daily   clopidogrel  75 mg Oral Daily   escitalopram  10 mg Oral Daily   gabapentin  300 mg Oral QHS   insulin aspart  0-15 Units Subcutaneous TID WC   insulin glargine-yfgn  10 Units Subcutaneous Daily   metoprolol tartrate  25 mg Oral Daily   pantoprazole  40 mg Oral BID   sodium chloride flush  3 mL Intravenous Q12H   traZODone  75 mg Oral QHS   Continuous Infusions:   LOS: 1 day        Briant Cedar, MD  [facility] 7PM-7AM contact night coverage as above

## 2023-06-24 LAB — GLUCOSE, CAPILLARY
Glucose-Capillary: 169 mg/dL — ABNORMAL HIGH (ref 70–99)
Glucose-Capillary: 204 mg/dL — ABNORMAL HIGH (ref 70–99)

## 2023-06-24 LAB — BASIC METABOLIC PANEL
Anion gap: 7 (ref 5–15)
BUN: 36 mg/dL — ABNORMAL HIGH (ref 8–23)
CO2: 28 mmol/L (ref 22–32)
Calcium: 8.7 mg/dL — ABNORMAL LOW (ref 8.9–10.3)
Chloride: 99 mmol/L (ref 98–111)
Creatinine, Ser: 1.73 mg/dL — ABNORMAL HIGH (ref 0.61–1.24)
GFR, Estimated: 39 mL/min — ABNORMAL LOW (ref >60)
Glucose, Bld: 153 mg/dL — ABNORMAL HIGH (ref 70–99)
Potassium: 3.8 mmol/L (ref 3.5–5.1)
Sodium: 134 mmol/L — ABNORMAL LOW (ref 135–145)

## 2023-06-24 LAB — CBC WITH DIFFERENTIAL/PLATELET
Abs Immature Granulocytes: 0.02 10*3/uL (ref 0.00–0.07)
Basophils Absolute: 0 10*3/uL (ref 0.0–0.1)
Basophils Relative: 0 %
Eosinophils Absolute: 0.7 10*3/uL — ABNORMAL HIGH (ref 0.0–0.5)
Eosinophils Relative: 9 %
HCT: 37.7 % — ABNORMAL LOW (ref 39.0–52.0)
Hemoglobin: 11.7 g/dL — ABNORMAL LOW (ref 13.0–17.0)
Immature Granulocytes: 0 %
Lymphocytes Relative: 24 %
Lymphs Abs: 1.8 10*3/uL (ref 0.7–4.0)
MCH: 25.4 pg — ABNORMAL LOW (ref 26.0–34.0)
MCHC: 31 g/dL (ref 30.0–36.0)
MCV: 82 fL (ref 80.0–100.0)
Monocytes Absolute: 0.9 10*3/uL (ref 0.1–1.0)
Monocytes Relative: 12 %
Neutro Abs: 4 10*3/uL (ref 1.7–7.7)
Neutrophils Relative %: 55 %
Platelets: 229 10*3/uL (ref 150–400)
RBC: 4.6 MIL/uL (ref 4.22–5.81)
RDW: 17 % — ABNORMAL HIGH (ref 11.5–15.5)
WBC: 7.4 10*3/uL (ref 4.0–10.5)
nRBC: 0 % (ref 0.0–0.2)

## 2023-06-24 LAB — HEMOGLOBIN A1C
Hgb A1c MFr Bld: 9 % — ABNORMAL HIGH (ref 4.8–5.6)
Mean Plasma Glucose: 211.6 mg/dL

## 2023-06-24 MED ORDER — FUROSEMIDE 20 MG PO TABS: 60.0000 mg | ORAL_TABLET | Freq: Every day | ORAL | 0 refills | Status: DC

## 2023-06-24 NOTE — Discharge Summary (Signed)
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)   Chronic kidney disease, stage 3a (HCC)   Coronary artery disease involving native heart without angina pectoris   Cardiac device in situ   S/P CABG x 3   SSS (sick sinus syndrome) (HCC)   Peripheral vascular disease (HCC)   S/P BKA (below knee amputation) unilateral, right (HCC)   Acute on chronic combined systolic and diastolic CHF, NYHA class 1 [facility])    Hospital Course: Patient is an 82 year old male with past medical history significant for hypertension, diabetes mellitus, chronic kidney disease stage III, coronary artery disease status post CABG, PVD, OSA on CPAP, chronic congestive heart failure, status post right BKA, atrial fibrillation, sick sinus syndrome s/p pacemaker placement, pulmonary fibrosis and adrenal insufficiency. Patient was admitted with acute on chronic systolic congestive heart failure.     Today, patient denies any new complaints, shortness of continues to improve.  Denies any chest pain, nausea/vomiting/chills.  Discussed with patient, wife at bedside.  Follow-up with PCP in 1 week and cardiology as scheduled    Assessment and Plan:  Acute on chronic systolic CHF Echo done with improving EF of 50-55%, grade III DD, with moderate to severe aortic valve stenosis, worsened from previous BNP elevated but still somewhat borderline at 248 Chest x-ray showing  similar chronic interstitial opacities and reticular changes Started with 60 mg IV Lasix twice daily, diuresed well, discharged on p.o. Lasix 60 mg daily as per cardiology Continue home metoprolol, Farxiga Patient will follow-up with cardiology   Mod-severe aortic valve stenosis Cardiology consulted, plan for further evaluation outpatient  Pulmonary fibrosis Currently on supportive care only per recent pulmonology note, had failed multiple trials On RA Supportive care, as needed albuterol   Atrial fibrillation Sick sinus syndrome Heart block Status post pacemaker Currently paced rhythm Continue home metoprolol, Eliquis   OSA Continue home CPAP    CAD Status post CABG x 3 Chest pain free Troponin 40-->41 Continue home Plavix, metoprolol, atorvastatin, Eliquis   PVD Continue home Plavix, Eliquis, atorvastatin   CKD 3a Routine labs as outpatient   Hypertension Continue metoprolol   Diabetes mellitus type 2, uncontrolled A1c  Likely 2/2 noncompliance to diet Continue outpatient regimen with adjustments given A1c of 9   Status post right BKA Status post left transmetatarsal amputation       Consultants: Cardiology Procedures performed: None Disposition: Home Diet recommendation:  Cardiac and Carb modified diet    DISCHARGE MEDICATION: Allergies as of 06/24/2023       Reactions   Codeine Hives, Itching   Ofev [nintedanib] Diarrhea   SEVERE DIARRHEA   Pirfenidone Diarrhea, Other (See Comments)   Esbriet (Pirfenidone) causes elevated LFTs. D/C on 06/14/17 and SEVERE DIARRHEA        Medication List     STOP taking these medications    fluocinonide 0.05 % external solution Commonly known as: LIDEX       TAKE these medications  albuterol (2.5 MG/3ML) 0.083% nebulizer solution Commonly known as: PROVENTIL Take 2.5 mg by nebulization 2 (two) times daily as needed for wheezing or shortness of breath. What changed: Another medication with the  same name was changed. Make sure you understand how and when to take each.   albuterol 108 (90 Base) MCG/ACT inhaler Commonly known as: VENTOLIN HFA Inhale 2 puffs into the lungs every 4 (four) hours as needed for wheezing or shortness of breath. What changed: how much to take   apixaban 5 MG Tabs tablet Commonly known as: ELIQUIS Take 1 tablet (5 mg total) by mouth 2 (two) times daily.   atorvastatin 80 MG tablet Commonly known as: LIPITOR TAKE 1 TABLET BY MOUTH EVERY DAY What changed: when to take this   clopidogrel 75 MG tablet Commonly known as: PLAVIX Take 75 mg by mouth daily.   docusate sodium 100 MG capsule Commonly known as: COLACE Take 1 capsule (100 mg total) by mouth daily. What changed:  when to take this reasons to take this   escitalopram 10 MG tablet Commonly known as: LEXAPRO Take 1 tablet (10 mg total) by mouth daily.   Farxiga 10 MG Tabs tablet Generic drug: dapagliflozin propanediol Take 10 mg by mouth daily.   furosemide 20 MG tablet Commonly known as: LASIX Take 3 tablets (60 mg total) by mouth daily. What changed:  medication strength how much to take   gabapentin 300 MG capsule Commonly known as: NEURONTIN TAKE 1 CAPSULE BY MOUTH THREE TIMES A DAY What changed: See the new instructions.   HumaLOG KwikPen 100 UNIT/ML KwikPen Generic drug: insulin lispro Inject 10-15 Units into the skin 3 (three) times daily. Depending what his blood sugar reading is   Hydromet 5-1.5 MG/5ML syrup Generic drug: HYDROcodone bit-homatropine Take 5 mLs by mouth as needed for cough.   LORazepam 1 MG tablet Commonly known as: ATIVAN Take 1 tablet (1 mg total) by mouth at bedtime. What changed:  when to take this reasons to take this   metoprolol tartrate 25 MG tablet Commonly known as: LOPRESSOR Take 25 mg by mouth daily. What changed: Another medication with the same name was removed. Continue taking this medication, and follow the directions you see  here.   multivitamin with minerals Tabs tablet Take 1 tablet by mouth daily.   mupirocin ointment 2 % Commonly known as: BACTROBAN Apply 1 Application topically at bedtime.   ondansetron 4 MG tablet Commonly known as: ZOFRAN Take 4 mg by mouth as needed for nausea or vomiting.   pantoprazole 40 MG tablet Commonly known as: PROTONIX Take 1 tablet (40 mg total) by mouth 2 (two) times daily. What changed: when to take this   PATADAY OP Place 2 drops into both eyes daily as needed (allergies).   polyethylene glycol powder 17 GM/SCOOP powder Commonly known as: GLYCOLAX/MIRALAX Take 17 g by mouth every other day.   traZODone 50 MG tablet Commonly known as: DESYREL Take 75 mg by mouth at bedtime as needed for sleep.   Evaristo Bury FlexTouch 100 UNIT/ML FlexTouch Pen Generic drug: insulin degludec Inject 20 Units into the skin daily.   vitamin C with rose hips 500 MG tablet Take 1 tablet (500 mg total) by mouth daily.   zinc sulfate 220 (50 Zn) MG capsule Take 1 capsule (220 mg total) by mouth daily.   ZZZQUIL PO Take 15 mLs by mouth at bedtime as needed (cough).        Follow-up Information  [name], MD. Schedule an appointment as soon as possible for a visit in 1 week(s).   Specialty: Internal Medicine Contact information: [address] [place] [address] [phone]                Discharge Exam: Ceasar Mons Weights   06/22/23 0507 06/23/23 0431 06/24/23 0515  Weight: 106.4 kg 105.7 kg 102.3 kg   General: NAD  Cardiovascular: S1, S2 present Respiratory: CTAB Abdomen: Soft, nontender, nondistended, bowel sounds present Musculoskeletal: No bilateral pedal edema noted, s/p R BKA Skin: Normal Psychiatry: Normal mood   Condition at discharge: stable  The results of significant diagnostics from this hospitalization (including imaging, microbiology, ancillary and laboratory) are listed below for reference.   Imaging Studies: ECHOCARDIOGRAM  COMPLETE  Result Date: 06/22/2023    ECHOCARDIOGRAM REPORT   Patient Name:   Shane [name] Date of Exam: 06/22/2023 Medical Rec #:  [id number]        Height:       72.0 in Accession #:    [id number]       Weight:       234.6 lb Date of Birth:  1941-11-11        BSA:          2.280 m Patient Age:    82 years         BP:           141/67 mmHg Patient Gender: M                HR:           61 bpm. Exam Location:  Inpatient Procedure: 2D Echo, Color Doppler, Cardiac Doppler and Intracardiac            Opacification Agent Indications:    I50.21 Acute systolic (congestive) heart failure  History:        Patient has prior history of Echocardiogram examinations, most                 recent 05/17/2022. CHF, Prior CABG and Pacemaker; Risk                 Factors:Hypertension, Diabetes, Dyslipidemia and Sleep Apnea.  Sonographer:    Irving Burton Senior RDCS Referring Phys: [id number] Cecille Po MELVIN  Sonographer Comments: Technically difficult study due to poor echo windows. Apicals too lateral to turn on left side. IMPRESSIONS  1. Left ventricular ejection fraction, by estimation, is 50 to 55%. The left ventricle has low normal function. Left ventricular endocardial border not optimally defined to evaluate regional wall motion. There is mild concentric left ventricular hypertrophy. Left ventricular diastolic parameters are consistent with Grade III diastolic dysfunction (restrictive).  2. Right ventricular systolic function is moderately reduced. The right ventricular size is not well visualized. Tricuspid regurgitation signal is inadequate for assessing PA pressure.  3. Left atrial size was severely dilated.  4. Right atrial size was moderately dilated.  5. The mitral valve is normal in structure. Trivial mitral valve regurgitation.  6. The aortic valve is tricuspid. There is severe calcifcation of the aortic valve. Aortic valve regurgitation is not visualized. Moderate to severe aortic valve stenosis. Aortic valve area, by VTI  measures 0.91 cm. Aortic valve mean gradient measures 33.0 mmHg. Aortic valve Vmax measures 3.62 m/s.  7. Aortic dilatation noted. There is mild dilatation of the ascending aorta, measuring 42 mm. There is borderline dilatation of the aortic root, measuring 38 mm.  8. The inferior vena cava is dilated in  size with <50% respiratory variability, suggesting right atrial pressure of 15 mmHg. Conclusion(s)/Recommendation(s): The AS gradient has increased since previous study now moderate to severe. FINDINGS  Left Ventricle: Left ventricular ejection fraction, by estimation, is 50 to 55%. The left ventricle has low normal function. Left ventricular endocardial border not optimally defined to evaluate regional wall motion. Definity contrast agent was given IV  to delineate the left ventricular endocardial borders. The left ventricular internal cavity size was normal in size. There is mild concentric left ventricular hypertrophy. Abnormal (paradoxical) septal motion consistent with post-operative status. Left ventricular diastolic parameters are consistent with Grade III diastolic dysfunction (restrictive). Right Ventricle: The right ventricular size is not well visualized. Right vetricular wall thickness was not well visualized. Right ventricular systolic function is moderately reduced. Tricuspid regurgitation signal is inadequate for assessing PA pressure. Left Atrium: Left atrial size was severely dilated. Right Atrium: Right atrial size was moderately dilated. Pericardium: There is no evidence of pericardial effusion. Mitral Valve: The mitral valve is normal in structure. Trivial mitral valve regurgitation. Tricuspid Valve: The tricuspid valve is normal in structure. Tricuspid valve regurgitation is mild. Aortic Valve: The aortic valve is tricuspid. There is severe calcifcation of the aortic valve. Aortic valve regurgitation is not visualized. Moderate to severe aortic stenosis is present. Aortic valve mean gradient  measures 33.0 mmHg. Aortic valve peak gradient measures 52.4 mmHg. Aortic valve area, by VTI measures 0.91 cm. Pulmonic Valve: The pulmonic valve was grossly normal. Pulmonic valve regurgitation is trivial. Aorta: Aortic dilatation noted. There is mild dilatation of the ascending aorta, measuring 42 mm. There is borderline dilatation of the aortic root, measuring 38 mm. Venous: The inferior vena cava is dilated in size with less than 50% respiratory variability, suggesting right atrial pressure of 15 mmHg. IAS/Shunts: No atrial level shunt detected by color flow Doppler. Additional Comments: A device lead is visualized in the right ventricle.  LEFT VENTRICLE PLAX 2D LVIDd:         5.20 cm   Diastology LVIDs:         3.90 cm   LV e' medial:    7.15 cm/s LV PW:         1.10 cm   LV E/e' medial:  17.1 LV IVS:        1.20 cm   LV e' lateral:   7.93 cm/s LVOT diam:     2.50 cm   LV E/e' lateral: 15.4 LV SV:         77 LV SV Index:   34 LVOT Area:     4.91 cm  RIGHT VENTRICLE RV S prime:     7.46 cm/s TAPSE (M-mode): 1.0 cm LEFT ATRIUM              Index        RIGHT ATRIUM           Index LA diam:        3.90 cm  1.71 cm/m   RA Area:     22.70 cm LA Vol (A2C):   112.0 ml 49.12 ml/m  RA Volume:   68.40 ml  30.00 ml/m LA Vol (A4C):   94.0 ml  41.23 ml/m LA Biplane Vol: 112.0 ml 49.12 ml/m  AORTIC VALVE AV Area (Vmax):    0.94 cm AV Area (Vmean):   0.99 cm AV Area (VTI):     0.91 cm AV Vmax:           362.00 cm/s AV Vmean:  268.000 cm/s AV VTI:            0.846 m AV Peak Grad:      52.4 mmHg AV Mean Grad:      33.0 mmHg LVOT Vmax:         69.00 cm/s LVOT Vmean:        54.200 cm/s LVOT VTI:          0.156 m LVOT/AV VTI ratio: 0.18  AORTA Ao Root diam: 3.80 cm Ao Asc diam:  3.85 cm MITRAL VALVE MV Area (PHT): 5.31 cm     SHUNTS MV Decel Time: 143 msec     Systemic VTI:  0.16 m MV E velocity: 122.00 cm/s  Systemic Diam: 2.50 cm MV A velocity: 67.50 cm/s MV E/A ratio:  1.81 Arvilla Meres MD Electronically  signed by Arvilla Meres MD Signature Date/Time: 06/22/2023/12:27:58 PM    Final    DG Foot Complete Left  Result Date: 06/21/2023 CLINICAL DATA:  r/o osteomyelitis EXAM: LEFT FOOT - COMPLETE 3+ VIEW COMPARISON:  Radiograph from 04/23/2021 FINDINGS: Bone mineralization within normal limits for patient's age. 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 The resection margins are sharp. No focal bone erosions. No evidence of air within the overlying soft tissue stump. No radiographic evidence of acute osteomyelitis. Correlate clinically to determine the need for additional imaging with more sensitive modality such as MRI. No acute fracture or dislocation. No aggressive osseous lesion. Mild degenerative osteoarthritis of imaged joints. Calcaneal spur noted along the Achilles tendon and Plantar aponeurosis attachment sites. No focal soft tissue swelling. No radiopaque foreign bodies. 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 No radiographic evidence of acute osteomyelitis. Electronically Signed   By: Jules Schick M.D.   On: 06/21/2023 11:22   DG Chest 2 View  Result Date: 06/21/2023 CLINICAL DATA:  Shortness of breath EXAM: CHEST - 2 VIEW COMPARISON:  Chest radiograph dated 03/24/2021 FINDINGS: Low lung volumes with bronchovascular crowding. Bilateral interstitial opacities. Blunting of the bilateral costophrenic angles. No pneumothorax. Similar enlarged cardiomediastinal silhouette. Cervical spinal fixation hardware appears intact. Median sternotomy wires are nondisplaced. IMPRESSION: 1.  Low lung volumes with bronchovascular crowding. 2. Similar bilateral chronic-appearing interstitial opacities and reticulations. 3. Similar cardiomegaly. Electronically Signed   By: Agustin Cree M.D.   On: 06/21/2023 11:19    Microbiology: Results for orders placed or performed during the hospital encounter of 06/21/23  SARS Coronavirus 2 by RT PCR (hospital  order, performed in [facility] hospital lab) *cepheid single result test* Anterior Nasal Swab     Status: None   Collection Time: 06/21/23  1:19 PM   Specimen: Anterior Nasal Swab  Result Value Ref Range Status   SARS Coronavirus 2 by RT PCR NEGATIVE NEGATIVE Final    Comment: Performed at [facility], [address], [place], [place] [address]   *Note: Due to a large number of results and/or encounters for the requested time period, some results have not been displayed. A complete set of results can be found in Results Review.    Labs: CBC: Recent Labs  Lab 06/21/23 1027 06/22/23 0232 06/24/23 0448  WBC 6.0 6.5 7.4  NEUTROABS 3.7  --  4.0  HGB 11.6* 10.7* 11.7*  HCT 38.1* 34.5* 37.7*  MCV 81.6 79.7* 82.0  PLT 185 186 229   Basic Metabolic Panel: Recent Labs  Lab 06/21/23 1027 06/21/23 1330 06/22/23 0232 06/22/23 2341 06/24/23 0448  NA 134*  --  135 137 134*  K 3.8  --  3.8 3.7 3.8  CL 97*  --  100 97* 99  CO2 25  --  28 29 28   GLUCOSE 145*  --  151* 172* 153*  BUN 40*  --  37* 37* 36*  CREATININE 1.59*  --  1.82* 1.87* 1.73*  CALCIUM 8.8*  --  8.4* 8.9 8.7*  MG  --  1.9  --  2.0  --   PHOS  --   --   --  3.6  --    Liver Function Tests: Recent Labs  Lab 06/21/23 1027 06/22/23 0232 06/22/23 2341  AST 19 17  --   ALT 21 18  --   ALKPHOS 121 98  --   BILITOT 0.6 0.9  --   PROT 7.0 6.3*  --   ALBUMIN 3.1* 2.8* 2.9*   CBG: Recent Labs  Lab 06/23/23 1130 06/23/23 1640 06/23/23 2124 06/24/23 0602 06/24/23 1028  GLUCAP 180* 211* 125* 169* 204*    Discharge time spent: greater than 30 minutes.  Signed: Briant Cedar, MD [facility] 06/24/2023

## 2023-06-24 NOTE — Progress Notes (Signed)
Rounding Note    Patient Name: Shane [name] Date of Encounter: 06/24/2023  [facility] Cardiologist: Olga Millers, MD / EP taylor  Subjective   Breathing is good   No CP   Inpatient Medications    Scheduled Meds:  apixaban  5 mg Oral BID   atorvastatin  80 mg Oral Daily   clopidogrel  75 mg Oral Daily   escitalopram  10 mg Oral Daily   gabapentin  300 mg Oral QHS   insulin aspart  0-15 Units Subcutaneous TID WC   insulin glargine-yfgn  10 Units Subcutaneous Daily   metoprolol tartrate  25 mg Oral Daily   pantoprazole  40 mg Oral BID   sodium chloride flush  3 mL Intravenous Q12H   traZODone  75 mg Oral QHS   Continuous Infusions:  PRN Meds: acetaminophen **OR** acetaminophen, albuterol, polyethylene glycol   Vital Signs    Vitals:   06/24/23 0100 06/24/23 0115 06/24/23 0515 06/24/23 0720  BP: 123/61  (!) 122/54 137/66  Pulse: (!) 59  60 (!) 59  Resp: 16  16 20   Temp: (!) 97.2 F (36.2 C)  (!) 97.5 F (36.4 C) (!) 97.5 F (36.4 C)  TempSrc: Oral  Oral Oral  SpO2: 90% 91% 97% 99%  Weight:   102.3 kg   Height:        Intake/Output Summary (Last 24 hours) at 06/24/2023 1012 Last data filed at 06/24/2023 0843 Gross per 24 hour  Intake 240 ml  Output 850 ml  Net -610 ml      06/24/2023    5:15 AM 06/23/2023    4:31 AM 06/22/2023    5:07 AM  Last 3 Weights  Weight (lbs) 225 lb 8.5 oz 233 lb 0.4 oz 234 lb 9.1 oz  Weight (kg) 102.3 kg 105.7 kg 106.4 kg      Telemetry    Paced  - Personally Reviewed  ECG    No new  - Personally Reviewed  Physical Exam   GEN: No acute distress.   Neck: JVP is normal  Cardiac: RRR, III/VI systolic mumur base  Respiratory: Clear to auscultation  GI: Soft, nontender, non-distended  MS: No edema;  R BKA; L transmet ampuation  Labs    High Sensitivity Troponin:   Recent Labs  Lab 06/21/23 1027 06/21/23 1330  TROPONINIHS 40* 41*     Chemistry Recent Labs  Lab 06/21/23 1027 06/21/23 1330  06/22/23 0232 06/22/23 2341 06/24/23 0448  NA 134*  --  135 137 134*  K 3.8  --  3.8 3.7 3.8  CL 97*  --  100 97* 99  CO2 25  --  28 29 28   GLUCOSE 145*  --  151* 172* 153*  BUN 40*  --  37* 37* 36*  CREATININE 1.59*  --  1.82* 1.87* 1.73*  CALCIUM 8.8*  --  8.4* 8.9 8.7*  MG  --  1.9  --  2.0  --   PROT 7.0  --  6.3*  --   --   ALBUMIN 3.1*  --  2.8* 2.9*  --   AST 19  --  17  --   --   ALT 21  --  18  --   --   ALKPHOS 121  --  98  --   --   BILITOT 0.6  --  0.9  --   --   GFRNONAA 43*  --  37* 35* 39*  ANIONGAP 12  --  7 11 7     Lipids No results for input(s): "CHOL", "TRIG", "HDL", "LABVLDL", "LDLCALC", "CHOLHDL" in the last 168 hours.  Hematology Recent Labs  Lab 06/21/23 1027 06/22/23 0232 06/24/23 0448  WBC 6.0 6.5 7.4  RBC 4.67 4.33 4.60  HGB 11.6* 10.7* 11.7*  HCT 38.1* 34.5* 37.7*  MCV 81.6 79.7* 82.0  MCH 24.8* 24.7* 25.4*  MCHC 30.4 31.0 31.0  RDW 17.2* 17.0* 17.0*  PLT 185 186 229   Thyroid No results for input(s): "TSH", "FREET4" in the last 168 hours.  BNP Recent Labs  Lab 06/21/23 1027  BNP 248.7*    DDimer No results for input(s): "DDIMER" in the last 168 hours.   Radiology    No results found.  Cardiac Studies    1. Left ventricular ejection fraction, by estimation, is 50 to 55%. The  left ventricle has low normal function. Left ventricular endocardial  border not optimally defined to evaluate regional wall motion. There is  mild concentric left ventricular  hypertrophy. Left ventricular diastolic parameters are consistent with  Grade III diastolic dysfunction (restrictive).   2. Right ventricular systolic function is moderately reduced. The right  ventricular size is not well visualized. Tricuspid regurgitation signal is  inadequate for assessing PA pressure.   3. Left atrial size was severely dilated.   4. Right atrial size was moderately dilated.   5. The mitral valve is normal in structure. Trivial mitral valve  regurgitation.    6. The aortic valve is tricuspid. There is severe calcifcation of the  aortic valve. Aortic valve regurgitation is not visualized. Moderate to  severe aortic valve stenosis. Aortic valve area, by VTI measures 0.91 cm.  Aortic valve mean gradient measures  33.0 mmHg. Aortic valve Vmax measures 3.62 m/s.   7. Aortic dilatation noted. There is mild dilatation of the ascending  aorta, measuring 42 mm. There is borderline dilatation of the aortic root,  measuring 38 mm.   8. The inferior vena cava is dilated in size with <50% respiratory  variability, suggesting right atrial pressure of 15 mmHg.   Conclusion(s)/Recommendation(s): The AS gradient has increased since  previous study now moderate to severe.     Assessment & Plan    1  HFpEF   Pt has diuresed and breathing is back to baseline   Start lasix 60 mg today    OK to d/c   Will arrange for f/u in clinic   2  Aortic stenosis   Echo showed AS mod to severe  Will need to follow closely   May need refer to structural servace   3  s/p PPM   DDD   Follows with EP  4.  CAD   Myoview 2017  NO ischemia  Pt denies CP   5  OSA  Uses CPAP   6  IPF  Follows in pulmonary     OK to d/c home  For questions or updates, please contact [facility] Please consult [email or web address] for contact info under        Signed, Dietrich Pates, MD  06/24/2023, 10:12 AM

## 2023-06-24 NOTE — Plan of Care (Signed)

## 2023-06-24 NOTE — Progress Notes (Signed)
Discharge instructions provided. Patient and sig other verbalize understanding of all instructions and follow-up care.  Patient discharged to home via wheelchair by NT with all belongings at 1458 on 06/24/23.

## 2023-07-12 ENCOUNTER — Telehealth: Payer: Self-pay | Admitting: Internal Medicine

## 2023-07-13 NOTE — Telephone Encounter (Signed)
Cancel with [place] the PFT

## 2023-07-13 NOTE — Telephone Encounter (Signed)
Patient has scheduled PFT at [facility] 07/19/23 and has PFT scheduled 07/28/23 at [facility] before OV.  Elease Hashimoto [facility]) is wanting to know if both PFT's are needed or if 07/28/23 can be cancelled.  Message routed to Dr. Marchelle Gearing to advise

## 2023-07-14 ENCOUNTER — Encounter (HOSPITAL_COMMUNITY): Payer: Medicare Other

## 2023-07-14 NOTE — Telephone Encounter (Signed)
Spoke to pt's wife and I informed her that I canceled her husbands PFT in our office due to pt having PFT done at [facility]. Pt's wife also stated she thinks they should cancel the f/up appt with Dr Phillis Knack because pt is getting a whole lung work up by [employer] and will f/up with Dr Marchelle Gearing when they have done all the other test and work up by [employer]. I informed pt's wife I will send Dr Marchelle Gearing a FYI to why pt canceled appt with him. Pt's wife verbalized understanding. NFN

## 2023-07-20 ENCOUNTER — Telehealth: Payer: Self-pay | Admitting: Internal Medicine

## 2023-07-20 NOTE — Telephone Encounter (Signed)
Yes. Already toldTay several days ago

## 2023-07-20 NOTE — Telephone Encounter (Signed)
Mrs. Homeyer wanted you to know he has an appt Monday at [facility] for his Aortic issues.He will resched his appt with you once he is able.

## 2023-07-20 NOTE — Telephone Encounter (Signed)
Ok thanks. Can cancel visit with me 07/28/23

## 2023-07-25 ENCOUNTER — Ambulatory Visit: Payer: Medicare Other | Admitting: Internal Medicine

## 2023-07-28 ENCOUNTER — Ambulatory Visit: Payer: Medicare Other | Admitting: Internal Medicine

## 2023-08-21 ENCOUNTER — Telehealth (HOSPITAL_COMMUNITY): Payer: Self-pay

## 2023-08-21 NOTE — Telephone Encounter (Signed)
Called patient to see if he is interested in the Cardiac Rehab Program. Patient expressed interest. Explained scheduling process and went over insurance, patient verbalized understanding. Will pass to nurse navigator for review.

## 2023-10-16 ENCOUNTER — Encounter (HOSPITAL_COMMUNITY): Payer: Medicare Other | Admitting: Cardiology

## 2023-11-23 ENCOUNTER — Ambulatory Visit: Payer: Medicare Other | Admitting: Orthopedic Surgery

## 2023-11-23 ENCOUNTER — Encounter: Payer: Self-pay | Admitting: Orthopedic Surgery

## 2023-11-23 DIAGNOSIS — Z89511 Acquired absence of right leg below knee: Secondary | ICD-10-CM | POA: Diagnosis not present

## 2023-11-23 DIAGNOSIS — Z89439 Acquired absence of unspecified foot: Secondary | ICD-10-CM | POA: Diagnosis not present

## 2023-11-23 DIAGNOSIS — L97521 Non-pressure chronic ulcer of other part of left foot limited to breakdown of skin: Secondary | ICD-10-CM

## 2023-11-23 MED ORDER — NITROGLYCERIN 0.2 MG/HR TD PT24: 0.2000 mg | MEDICATED_PATCH | Freq: Every day | TRANSDERMAL | 12 refills | Status: DC

## 2023-11-23 NOTE — Progress Notes (Signed)
Office Visit Note   Patient: Shane [name]           Date of Birth: 09-17-1941           MRN: [id number] Visit Date: 11/23/2023              Requested by: [name], MD [address] [place],  [place] [address] PCP: [name], MD  Chief Complaint  Patient presents with   Left Leg - Wound Check      HPI: Patient is an 82 year old gentleman status post transmetatarsal amputation on the left and status post transtibial amputation on the right.  Patient states he is been treated with conservative wound care for a ulcer over the lateral aspect of the transmetatarsal amputation on the left.  He has been using Vashe for wound cleansing.  Assessment & Plan: Visit Diagnoses:  1. Hx of right BKA (HCC)   2. History of transmetatarsal amputation of foot (HCC)   3. Non-pressure chronic ulcer of other part of left foot limited to breakdown of skin (HCC)     Plan: Patient has a appointment with vascular surgery to further evaluate his vascular status.  I sent in a prescription for nitroglycerin patch to try to help improve the microcirculation.  Patient has a dampened monophasic pulse at the ankle.  Follow-Up Instructions: Return in about 4 weeks (around 12/21/2023).   Ortho Exam  Patient is alert, oriented, no adenopathy, well-dressed, normal affect, normal respiratory effort. Examination patient has no cellulitis no gangrenous changes to the transmetatarsal amputation.  Patient does not have a palpable pulse.  With a Doppler patient has a dampened monophasic dorsalis pedis pulse.  He has a superficial flat ischemic ulcer over the lateral aspect of the transmetatarsal amputation that is 1 x 2 cm with flat granulation tissue.  Imaging: No results found. No images are attached to the encounter.  Labs: Lab Results  Component Value Date   HGBA1C 9.0 (H) 06/24/2023   HGBA1C 7.8 (H) 02/15/2021   HGBA1C 6.6 (H) 12/09/2020   ESRSEDRATE 22 (H) 10/14/2016   CRP 8.3 (H) 12/09/2020    CRP 4.1 (H) 07/17/2020   REPTSTATUS 02/22/2021 FINAL 02/19/2021   GRAMSTAIN  02/19/2021    RARE WBC PRESENT, PREDOMINANTLY PMN NO ORGANISMS SEEN    CULT  02/19/2021    FEW ESCHERICHIA COLI NO ANAEROBES ISOLATED Performed at [facility], [address] [place], [place] [address]    [facility] ESCHERICHIA COLI 02/19/2021     Lab Results  Component Value Date   ALBUMIN 2.9 (L) 06/22/2023   ALBUMIN 2.8 (L) 06/22/2023   ALBUMIN 3.1 (L) 06/21/2023    Lab Results  Component Value Date   MG 2.0 06/22/2023   MG 1.9 06/21/2023   MG 1.9 03/26/2021   Lab Results  Component Value Date   VD25OH 32.29 12/14/2022    No results found for: "PREALBUMIN"    Latest Ref Rng & Units 06/24/2023    4:48 AM 06/22/2023    2:32 AM 06/21/2023   10:27 AM  CBC EXTENDED  WBC 4.0 - 10.5 K/uL 7.4  6.5  6.0   RBC 4.22 - 5.81 MIL/uL 4.60  4.33  4.67   Hemoglobin 13.0 - 17.0 g/dL 84.6  96.2  95.2   HCT 39.0 - 52.0 % 37.7  34.5  38.1   Platelets 150 - 400 K/uL 229  186  185   NEUT# 1.7 - 7.7 K/uL 4.0   3.7   Lymph#  0.7 - 4.0 K/uL 1.8   1.1      There is no height or weight on file to calculate BMI.  Orders:  No orders of the defined types were placed in this encounter.  Meds ordered this encounter  Medications   nitroGLYCERIN (NITRODUR - DOSED IN MG/24 HR) 0.2 mg/hr patch    Sig: Place 1 patch (0.2 mg total) onto the skin daily.    Dispense:  30 patch    Refill:  12     Procedures: No procedures performed  Clinical Data: No additional findings.  ROS:  All other systems negative, except as noted in the HPI. Review of Systems  Objective: Vital Signs: There were no vitals taken for this visit.  Specialty Comments:  No specialty comments available.  PMFS History: Patient Active Problem List   Diagnosis Date Noted   Acute on chronic combined systolic and diastolic CHF, NYHA class 1 (HCC) 06/22/2023   Acute on chronic systolic (congestive) heart failure (HCC) 06/21/2023    Intermediate stage nonexudative age-related macular degeneration of both eyes 04/25/2022   Research study patient 12/24/2021   Labile blood glucose    Pulmonary fibrosis (HCC)    Hyponatremia    S/P BKA (below knee amputation) unilateral, right (HCC)    Shortness of breath    Postoperative pain    Atherosclerosis of native arteries of extremities with gangrene, right leg (HCC)    Hardware complicating wound infection (HCC)    Abscess of bursa of right ankle 02/15/2021   Endocarditis 01/19/2021   Peripheral vascular disease (HCC) 12/29/2020   Chronic systolic CHF (congestive heart failure) (HCC) 12/22/2020   History of COVID-19 12/22/2020   SSS (sick sinus syndrome) (HCC) 12/22/2020   Acute bacterial endocarditis    MSSA bacteremia 12/09/2020   Diabetic foot ulcer (HCC) 12/09/2020   Foot drop, right foot    Generalized weakness 12/08/2020   Atrial fibrillation, chronic (HCC) 12/08/2020   Elevated troponin level not due myocardial infarction 12/08/2020   Adrenal insufficiency (HCC) 11/14/2020   Orthostatic hypotension 11/14/2020   Physical deconditioning 11/14/2020   Difficulty sleeping 11/07/2020   Right ventricular dysfunction 11/05/2020   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/04/2020   Hearing loss 11/03/2020   Cardiac device in situ 09/09/2020   Coronary artery disease involving native heart without angina pectoris 07/10/2020   Chronic kidney disease, stage 3a (HCC) 03/29/2020   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/28/2020   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) 03/16/2020   Right epiretinal membrane 03/16/2020   Posterior vitreous detachment of right eye 03/16/2020   Aortic stenosis, moderate 03/12/2020   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   Near syncope 04/27/2018   Hypoglycemia due to insulin 01/06/2018   Essential hypertension 01/06/2018   Syncope and collapse 01/05/2018   Encounter for therapeutic drug monitoring 06/29/2017    OSA on CPAP 05/05/2017   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HCC) 01/05/2017   Abnormal chest x-ray 10/12/2016   Benign neoplasm of colon 01/14/2013   Past Medical History:  Diagnosis Date   Acute blood loss anemia    Anxiety    AV block, Mobitz 1    Cataract    Chronic kidney disease    d/t DM   CKD (chronic kidney disease), stage III (HCC)    COVID-19 virus infection 07/18/2020   Last Assessment & Plan:   Formatting of this note might be different from the original.  Immunocompromise high risk patient  -  ID consulted for further therapies and will administer regeneron as OP tomorrow   -Decadron discontinued as patient is off O2     Depression    Diabetes mellitus    Vgo disposal insulin bolus  simular to insulin pump   Dyspnea    GERD (gastroesophageal reflux disease)    History of kidney stones    passed   Hyperlipidemia    Hypertension    Idiopathic pulmonary fibrosis (HCC) 11/2016   ILD (interstitial lung disease) (HCC)    Moderate aortic stenosis    a. 10/2019 Echo: EF 55-60%, Gr2 DD. Nl RV.    Neuromuscular disorder (HCC)    Neuropathy associated with endocrine disorder (HCC)    Nonobstructive CAD (coronary artery disease)    a. 2012 Cath: mod, nonobs dzs; b. 10/2016 MV: EF 60%, no ischemia.   OSA on CPAP 05/05/2017   Unattended Home Sleep Test 7/2/813-AHI 38.6/hour, desaturation to 64%, body weight 261 pounds   PONV (postoperative nausea and vomiting)    Postoperative anemia due to acute blood loss 11/07/2020   Postoperative hemorrhagic shock 03/20/2021   Sleep apnea     uses cpap asked to bring mask and tubing    Family History  Problem Relation Age of Onset   Diabetes Mellitus II Mother    Emphysema Father 60   Heart attack Father    Colon cancer Neg Hx    Esophageal cancer Neg Hx    Rectal cancer Neg Hx    Stomach cancer Neg Hx     Past Surgical History:  Procedure Laterality Date   ABDOMINAL AORTOGRAM W/LOWER EXTREMITY N/A 12/10/2020   Procedure:  ABDOMINAL AORTOGRAM W/LOWER EXTREMITY;  Surgeon: Leonie Douglas, MD;  Location: [facility] INVASIVE CV LAB;  Service: Cardiovascular;  Laterality: N/A;   AMPUTATION Right 01/22/2021   Procedure: 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;  Surgeon: [name], MD;  Location: [facility] OR;  Service: Orthopedics;  Laterality: Right;   AMPUTATION Right 03/17/2021   Procedure: RIGHT BELOW KNEE AMPUTATION;  Surgeon: [name], MD;  Location: [facility] OR;  Service: Orthopedics;  Laterality: Right;   ANKLE FUSION Right 01/22/2021   Procedure: RIGHT FOOT TIBIOCALCANEAL FUSION;  Surgeon: [name], MD;  Location: [facility] OR;  Service: Orthopedics;  Laterality: Right;   ANTERIOR FUSION CERVICAL SPINE  2012   CARDIAC CATHETERIZATION  2011   CARDIAC CATHETERIZATION N/A 11/09/2016   Procedure: Right Heart Cath;  Surgeon: Lyn Records, MD;  Location: [facility] INVASIVE CV LAB;  Service: Cardiovascular;  Laterality: N/A;   carpel tunnel     left wrist   CATARACT EXTRACTION     CATARACT EXTRACTION W/ INTRAOCULAR LENS  IMPLANT, BILATERAL  2013   CERVICAL LAMINECTOMY  2012   COLONOSCOPY N/A 01/14/2013   Procedure: COLONOSCOPY;  Surgeon: Hilarie Fredrickson, MD;  Location: [facility] ENDOSCOPY;  Service: Endoscopy;  Laterality: N/A;   CORONARY ARTERY BYPASS GRAFT  11/04/2020   LIMA-LAD, SVG-OM1, SVG-PDA (Dr [facility]) dc 11/18/2020   EYE SURGERY     I & D EXTREMITY Right 02/19/2021   Procedure: RIGHT ANKLE DEBRIDEMENT AND PLACEMENT ANTIBIOTIC BEADS;  Surgeon: [name], MD;  Location: [facility] OR;  Service: Orthopedics;  Laterality: Right;   KNEE SURGERY  1998   left   LEFT HEART CATH AND CORONARY ANGIOGRAPHY N/A 07/10/2020   Procedure: LEFT HEART CATH AND CORONARY ANGIOGRAPHY;  Surgeon: Tonny Bollman, MD;  Location: [facility] INVASIVE CV LAB;  Service: Cardiovascular;  Laterality: N/A;   LUMBAR  LAMINECTOMY  2003   LUNG BIOPSY Left 12/26/2016   Procedure: LUNG BIOPSY;  Surgeon: Loreli Slot, MD;  Location: [facility] OR;  Service: Thoracic;  Laterality: Left;    PACEMAKER IMPLANT N/A 03/30/2020   Procedure: PACEMAKER IMPLANT;  Surgeon: Marinus Maw, MD;  Location: [facility];  Service: Cardiovascular;  Laterality: N/A;   PERIPHERAL VASCULAR INTERVENTION Right 12/10/2020   Procedure: PERIPHERAL VASCULAR INTERVENTION;  Surgeon: Leonie Douglas, MD;  Location: [facility];  Service: Cardiovascular;  Laterality: Right;  SFA   POSTERIOR FUSION CERVICAL SPINE  2012   PPM GENERATOR REMOVAL N/A 12/14/2020   Procedure: PPM GENERATOR REMOVAL;  Surgeon: Marinus Maw, MD;  Location: [facility];  Service: Cardiovascular;  Laterality: N/A;   TEE WITHOUT CARDIOVERSION N/A 12/11/2020   Procedure: TRANSESOPHAGEAL ECHOCARDIOGRAM (TEE);  Surgeon: Sande Rives, MD;  Location: [facility] ENDOSCOPY;  Service: Cardiovascular;  Laterality: N/A;   TRIGGER FINGER RELEASE  2011   4th finger left hand   VIDEO ASSISTED THORACOSCOPY Left 12/26/2016   Procedure: VIDEO ASSISTED THORACOSCOPY;  Surgeon: Loreli Slot, MD;  Location: [facility] OR;  Service: Thoracic;  Laterality: Left;   VIDEO BRONCHOSCOPY N/A 12/26/2016   Procedure: VIDEO BRONCHOSCOPY;  Surgeon: Loreli Slot, MD;  Location: [facility] OR;  Service: Thoracic;  Laterality: N/A;   Social History   Occupational History   Not on file  Tobacco Use   Smoking status: Never   Smokeless tobacco: Never  Vaping Use   Vaping status: Never Used  Substance and Sexual Activity   Alcohol use: No   Drug use: No   Sexual activity: Not Currently

## 2023-11-24 ENCOUNTER — Encounter (HOSPITAL_COMMUNITY): Payer: Medicare Other | Admitting: Cardiology

## 2023-11-24 ENCOUNTER — Ambulatory Visit (HOSPITAL_COMMUNITY)
Admission: RE | Admit: 2023-11-24 | Discharge: 2023-11-24 | Disposition: A | Discharge status: Home / Self Care | Payer: Medicare Other | Source: Ambulatory Visit | Attending: Vascular Surgery | Admitting: Vascular Surgery

## 2023-11-24 ENCOUNTER — Other Ambulatory Visit (HOSPITAL_COMMUNITY): Payer: Self-pay | Admitting: Registered Nurse

## 2023-11-24 DIAGNOSIS — R52 Pain, unspecified: Secondary | ICD-10-CM

## 2023-11-24 LAB — VAS US ABI WITH/WO TBI

## 2023-12-06 ENCOUNTER — Inpatient Hospital Stay (HOSPITAL_COMMUNITY)
Admission: EM | Admit: 2023-12-06 | Discharge: 2023-12-30 | DRG: 177 | Disposition: A | Discharge status: Skilled Nursing Facility | Payer: Medicare Other | Attending: Internal Medicine | Admitting: Internal Medicine

## 2023-12-06 ENCOUNTER — Emergency Department (HOSPITAL_COMMUNITY): Payer: Medicare Other

## 2023-12-06 DIAGNOSIS — G7281 Critical illness myopathy: Secondary | ICD-10-CM | POA: Diagnosis not present

## 2023-12-06 DIAGNOSIS — I442 Atrioventricular block, complete: Secondary | ICD-10-CM | POA: Diagnosis present

## 2023-12-06 DIAGNOSIS — N170 Acute kidney failure with tubular necrosis: Secondary | ICD-10-CM | POA: Diagnosis present

## 2023-12-06 DIAGNOSIS — Z87442 Personal history of urinary calculi: Secondary | ICD-10-CM

## 2023-12-06 DIAGNOSIS — J84112 Idiopathic pulmonary fibrosis: Secondary | ICD-10-CM | POA: Diagnosis present

## 2023-12-06 DIAGNOSIS — J1282 Pneumonia due to coronavirus disease 2019: Secondary | ICD-10-CM | POA: Diagnosis present

## 2023-12-06 DIAGNOSIS — E1122 Type 2 diabetes mellitus with diabetic chronic kidney disease: Secondary | ICD-10-CM | POA: Diagnosis present

## 2023-12-06 DIAGNOSIS — I5043 Acute on chronic combined systolic (congestive) and diastolic (congestive) heart failure: Secondary | ICD-10-CM | POA: Diagnosis present

## 2023-12-06 DIAGNOSIS — I252 Old myocardial infarction: Secondary | ICD-10-CM

## 2023-12-06 DIAGNOSIS — E119 Type 2 diabetes mellitus without complications: Secondary | ICD-10-CM | POA: Diagnosis not present

## 2023-12-06 DIAGNOSIS — Z8616 Personal history of COVID-19: Secondary | ICD-10-CM | POA: Diagnosis not present

## 2023-12-06 DIAGNOSIS — I214 Non-ST elevation (NSTEMI) myocardial infarction: Secondary | ICD-10-CM

## 2023-12-06 DIAGNOSIS — N179 Acute kidney failure, unspecified: Secondary | ICD-10-CM | POA: Diagnosis present

## 2023-12-06 DIAGNOSIS — F419 Anxiety disorder, unspecified: Secondary | ICD-10-CM | POA: Diagnosis present

## 2023-12-06 DIAGNOSIS — D631 Anemia in chronic kidney disease: Secondary | ICD-10-CM | POA: Diagnosis present

## 2023-12-06 DIAGNOSIS — D649 Anemia, unspecified: Secondary | ICD-10-CM

## 2023-12-06 DIAGNOSIS — R58 Hemorrhage, not elsewhere classified: Secondary | ICD-10-CM

## 2023-12-06 DIAGNOSIS — I130 Hypertensive heart and chronic kidney disease with heart failure and stage 1 through stage 4 chronic kidney disease, or unspecified chronic kidney disease: Secondary | ICD-10-CM | POA: Diagnosis present

## 2023-12-06 DIAGNOSIS — Z751 Person awaiting admission to adequate facility elsewhere: Secondary | ICD-10-CM

## 2023-12-06 DIAGNOSIS — U071 COVID-19: Secondary | ICD-10-CM | POA: Diagnosis not present

## 2023-12-06 DIAGNOSIS — E1140 Type 2 diabetes mellitus with diabetic neuropathy, unspecified: Secondary | ICD-10-CM | POA: Diagnosis present

## 2023-12-06 DIAGNOSIS — Z953 Presence of xenogenic heart valve: Secondary | ICD-10-CM | POA: Diagnosis not present

## 2023-12-06 DIAGNOSIS — I2510 Atherosclerotic heart disease of native coronary artery without angina pectoris: Secondary | ICD-10-CM | POA: Diagnosis not present

## 2023-12-06 DIAGNOSIS — Z794 Long term (current) use of insulin: Secondary | ICD-10-CM

## 2023-12-06 DIAGNOSIS — Z951 Presence of aortocoronary bypass graft: Secondary | ICD-10-CM | POA: Diagnosis not present

## 2023-12-06 DIAGNOSIS — E785 Hyperlipidemia, unspecified: Secondary | ICD-10-CM | POA: Diagnosis present

## 2023-12-06 DIAGNOSIS — Z950 Presence of cardiac pacemaker: Secondary | ICD-10-CM

## 2023-12-06 DIAGNOSIS — L97521 Non-pressure chronic ulcer of other part of left foot limited to breakdown of skin: Secondary | ICD-10-CM | POA: Diagnosis not present

## 2023-12-06 DIAGNOSIS — E875 Hyperkalemia: Principal | ICD-10-CM

## 2023-12-06 DIAGNOSIS — I429 Cardiomyopathy, unspecified: Secondary | ICD-10-CM | POA: Diagnosis present

## 2023-12-06 DIAGNOSIS — I739 Peripheral vascular disease, unspecified: Secondary | ICD-10-CM | POA: Diagnosis not present

## 2023-12-06 DIAGNOSIS — E871 Hypo-osmolality and hyponatremia: Secondary | ICD-10-CM | POA: Diagnosis present

## 2023-12-06 DIAGNOSIS — Z885 Allergy status to narcotic agent status: Secondary | ICD-10-CM

## 2023-12-06 DIAGNOSIS — E1151 Type 2 diabetes mellitus with diabetic peripheral angiopathy without gangrene: Secondary | ICD-10-CM | POA: Diagnosis present

## 2023-12-06 DIAGNOSIS — K219 Gastro-esophageal reflux disease without esophagitis: Secondary | ICD-10-CM | POA: Diagnosis present

## 2023-12-06 DIAGNOSIS — Z7982 Long term (current) use of aspirin: Secondary | ICD-10-CM

## 2023-12-06 DIAGNOSIS — J9621 Acute and chronic respiratory failure with hypoxia: Secondary | ICD-10-CM | POA: Diagnosis present

## 2023-12-06 DIAGNOSIS — I509 Heart failure, unspecified: Secondary | ICD-10-CM | POA: Diagnosis not present

## 2023-12-06 DIAGNOSIS — R1314 Dysphagia, pharyngoesophageal phase: Secondary | ICD-10-CM | POA: Diagnosis not present

## 2023-12-06 DIAGNOSIS — Z79899 Other long term (current) drug therapy: Secondary | ICD-10-CM

## 2023-12-06 DIAGNOSIS — I5032 Chronic diastolic (congestive) heart failure: Secondary | ICD-10-CM

## 2023-12-06 DIAGNOSIS — G4730 Sleep apnea, unspecified: Secondary | ICD-10-CM | POA: Diagnosis present

## 2023-12-06 DIAGNOSIS — E11621 Type 2 diabetes mellitus with foot ulcer: Secondary | ICD-10-CM | POA: Diagnosis present

## 2023-12-06 DIAGNOSIS — E669 Obesity, unspecified: Secondary | ICD-10-CM | POA: Diagnosis present

## 2023-12-06 DIAGNOSIS — I4891 Unspecified atrial fibrillation: Secondary | ICD-10-CM | POA: Diagnosis present

## 2023-12-06 DIAGNOSIS — Z888 Allergy status to other drugs, medicaments and biological substances status: Secondary | ICD-10-CM

## 2023-12-06 DIAGNOSIS — F32A Depression, unspecified: Secondary | ICD-10-CM | POA: Diagnosis present

## 2023-12-06 DIAGNOSIS — Z833 Family history of diabetes mellitus: Secondary | ICD-10-CM

## 2023-12-06 DIAGNOSIS — K683 Retroperitoneal hematoma: Secondary | ICD-10-CM | POA: Diagnosis not present

## 2023-12-06 DIAGNOSIS — R1319 Other dysphagia: Secondary | ICD-10-CM | POA: Diagnosis not present

## 2023-12-06 DIAGNOSIS — D62 Acute posthemorrhagic anemia: Secondary | ICD-10-CM | POA: Diagnosis not present

## 2023-12-06 DIAGNOSIS — E1165 Type 2 diabetes mellitus with hyperglycemia: Secondary | ICD-10-CM | POA: Diagnosis present

## 2023-12-06 DIAGNOSIS — Z825 Family history of asthma and other chronic lower respiratory diseases: Secondary | ICD-10-CM

## 2023-12-06 DIAGNOSIS — Z6832 Body mass index (BMI) 32.0-32.9, adult: Secondary | ICD-10-CM | POA: Diagnosis not present

## 2023-12-06 DIAGNOSIS — R451 Restlessness and agitation: Secondary | ICD-10-CM | POA: Diagnosis not present

## 2023-12-06 DIAGNOSIS — R578 Other shock: Secondary | ICD-10-CM | POA: Diagnosis present

## 2023-12-06 DIAGNOSIS — I21A1 Myocardial infarction type 2: Secondary | ICD-10-CM | POA: Diagnosis present

## 2023-12-06 DIAGNOSIS — Z8249 Family history of ischemic heart disease and other diseases of the circulatory system: Secondary | ICD-10-CM

## 2023-12-06 DIAGNOSIS — D72810 Lymphocytopenia: Secondary | ICD-10-CM | POA: Diagnosis present

## 2023-12-06 DIAGNOSIS — N1832 Chronic kidney disease, stage 3b: Secondary | ICD-10-CM | POA: Diagnosis present

## 2023-12-06 DIAGNOSIS — Z7901 Long term (current) use of anticoagulants: Secondary | ICD-10-CM

## 2023-12-06 DIAGNOSIS — Z981 Arthrodesis status: Secondary | ICD-10-CM

## 2023-12-06 DIAGNOSIS — R1310 Dysphagia, unspecified: Secondary | ICD-10-CM | POA: Diagnosis not present

## 2023-12-06 DIAGNOSIS — Z7902 Long term (current) use of antithrombotics/antiplatelets: Secondary | ICD-10-CM

## 2023-12-06 DIAGNOSIS — J069 Acute upper respiratory infection, unspecified: Secondary | ICD-10-CM | POA: Diagnosis present

## 2023-12-06 DIAGNOSIS — Z89511 Acquired absence of right leg below knee: Secondary | ICD-10-CM

## 2023-12-06 DIAGNOSIS — I480 Paroxysmal atrial fibrillation: Secondary | ICD-10-CM | POA: Diagnosis present

## 2023-12-06 DIAGNOSIS — E440 Moderate protein-calorie malnutrition: Secondary | ICD-10-CM | POA: Diagnosis present

## 2023-12-06 DIAGNOSIS — Z7985 Long-term (current) use of injectable non-insulin antidiabetic drugs: Secondary | ICD-10-CM

## 2023-12-06 DIAGNOSIS — I5023 Acute on chronic systolic (congestive) heart failure: Secondary | ICD-10-CM | POA: Diagnosis not present

## 2023-12-06 DIAGNOSIS — E66811 Obesity, class 1: Secondary | ICD-10-CM | POA: Diagnosis present

## 2023-12-06 DIAGNOSIS — Z952 Presence of prosthetic heart valve: Secondary | ICD-10-CM | POA: Diagnosis not present

## 2023-12-06 DIAGNOSIS — K5900 Constipation, unspecified: Secondary | ICD-10-CM | POA: Diagnosis present

## 2023-12-06 DIAGNOSIS — I495 Sick sinus syndrome: Secondary | ICD-10-CM | POA: Diagnosis present

## 2023-12-06 DIAGNOSIS — I080 Rheumatic disorders of both mitral and aortic valves: Secondary | ICD-10-CM | POA: Diagnosis present

## 2023-12-06 DIAGNOSIS — Z89421 Acquired absence of other right toe(s): Secondary | ICD-10-CM

## 2023-12-06 DIAGNOSIS — G4733 Obstructive sleep apnea (adult) (pediatric): Secondary | ICD-10-CM | POA: Diagnosis present

## 2023-12-06 DIAGNOSIS — E872 Acidosis, unspecified: Secondary | ICD-10-CM | POA: Diagnosis present

## 2023-12-06 DIAGNOSIS — R7989 Other specified abnormal findings of blood chemistry: Secondary | ICD-10-CM | POA: Diagnosis not present

## 2023-12-06 DIAGNOSIS — Z89512 Acquired absence of left leg below knee: Secondary | ICD-10-CM

## 2023-12-06 LAB — CBC WITH DIFFERENTIAL/PLATELET
Abs Immature Granulocytes: 0.07 10*3/uL (ref 0.00–0.07)
Basophils Absolute: 0 10*3/uL (ref 0.0–0.1)
Basophils Relative: 0 %
Eosinophils Absolute: 0 10*3/uL (ref 0.0–0.5)
Eosinophils Relative: 1 %
HCT: 30.1 % — ABNORMAL LOW (ref 39.0–52.0)
Hemoglobin: 9.5 g/dL — ABNORMAL LOW (ref 13.0–17.0)
Immature Granulocytes: 1 %
Lymphocytes Relative: 5 %
Lymphs Abs: 0.5 10*3/uL — ABNORMAL LOW (ref 0.7–4.0)
MCH: 27.2 pg (ref 26.0–34.0)
MCHC: 31.6 g/dL (ref 30.0–36.0)
MCV: 86.2 fL (ref 80.0–100.0)
Monocytes Absolute: 1.6 10*3/uL — ABNORMAL HIGH (ref 0.1–1.0)
Monocytes Relative: 18 %
Neutro Abs: 6.7 10*3/uL (ref 1.7–7.7)
Neutrophils Relative %: 75 %
Platelets: 192 10*3/uL (ref 150–400)
RBC: 3.49 MIL/uL — ABNORMAL LOW (ref 4.22–5.81)
RDW: 19.1 % — ABNORMAL HIGH (ref 11.5–15.5)
WBC: 8.8 10*3/uL (ref 4.0–10.5)
nRBC: 0 % (ref 0.0–0.2)

## 2023-12-06 LAB — BASIC METABOLIC PANEL
Anion gap: 15 (ref 5–15)
BUN: 118 mg/dL — ABNORMAL HIGH (ref 8–23)
CO2: 18 mmol/L — ABNORMAL LOW (ref 22–32)
Calcium: 8.3 mg/dL — ABNORMAL LOW (ref 8.9–10.3)
Chloride: 96 mmol/L — ABNORMAL LOW (ref 98–111)
Creatinine, Ser: 4.79 mg/dL — ABNORMAL HIGH (ref 0.61–1.24)
GFR, Estimated: 11 mL/min — ABNORMAL LOW (ref >60)
Glucose, Bld: 165 mg/dL — ABNORMAL HIGH (ref 70–99)
Potassium: >7.5 mmol/L (ref 3.5–5.1)
Sodium: 129 mmol/L — ABNORMAL LOW (ref 135–145)

## 2023-12-06 LAB — URINALYSIS, ROUTINE W REFLEX MICROSCOPIC
Bilirubin Urine: NEGATIVE
Glucose, UA: 50 mg/dL — AB
Hgb urine dipstick: NEGATIVE
Ketones, ur: NEGATIVE mg/dL
Leukocytes,Ua: NEGATIVE
Nitrite: NEGATIVE
Protein, ur: NEGATIVE mg/dL
Specific Gravity, Urine: 1.017 (ref 1.005–1.030)
pH: 5 (ref 5.0–8.0)

## 2023-12-06 LAB — I-STAT CG4 LACTIC ACID, ED
Lactic Acid, Venous: 2.3 mmol/L (ref 0.5–1.9)
Lactic Acid, Venous: 1.9 mmol/L (ref 0.5–1.9)

## 2023-12-06 LAB — SODIUM, URINE, RANDOM: Sodium, Ur: 16 mmol/L

## 2023-12-06 LAB — COMPREHENSIVE METABOLIC PANEL
ALT: 16 U/L (ref 0–44)
AST: 45 U/L — ABNORMAL HIGH (ref 15–41)
Albumin: 2.9 g/dL — ABNORMAL LOW (ref 3.5–5.0)
Alkaline Phosphatase: 85 U/L (ref 38–126)
Anion gap: 14 (ref 5–15)
BUN: 116 mg/dL — ABNORMAL HIGH (ref 8–23)
CO2: 16 mmol/L — ABNORMAL LOW (ref 22–32)
Calcium: 7.9 mg/dL — ABNORMAL LOW (ref 8.9–10.3)
Chloride: 100 mmol/L (ref 98–111)
Creatinine, Ser: 4.69 mg/dL — ABNORMAL HIGH (ref 0.61–1.24)
GFR, Estimated: 12 mL/min — ABNORMAL LOW (ref >60)
Glucose, Bld: 165 mg/dL — ABNORMAL HIGH (ref 70–99)
Potassium: 7.1 mmol/L (ref 3.5–5.1)
Sodium: 130 mmol/L — ABNORMAL LOW (ref 135–145)
Total Bilirubin: 0.6 mg/dL (ref 0.0–1.2)
Total Protein: 6.8 g/dL (ref 6.5–8.1)

## 2023-12-06 LAB — PHOSPHORUS: Phosphorus: 7.6 mg/dL — ABNORMAL HIGH (ref 2.5–4.6)

## 2023-12-06 LAB — RESP PANEL BY RT-PCR (RSV, FLU A&B, COVID)  RVPGX2
Influenza A by PCR: NEGATIVE
Influenza B by PCR: NEGATIVE
Resp Syncytial Virus by PCR: NEGATIVE
SARS Coronavirus 2 by RT PCR: POSITIVE — AB

## 2023-12-06 LAB — CBC
HCT: 28 % — ABNORMAL LOW (ref 39.0–52.0)
Hemoglobin: 8.9 g/dL — ABNORMAL LOW (ref 13.0–17.0)
MCH: 27.2 pg (ref 26.0–34.0)
MCHC: 31.8 g/dL (ref 30.0–36.0)
MCV: 85.6 fL (ref 80.0–100.0)
Platelets: 161 10*3/uL (ref 150–400)
RBC: 3.27 MIL/uL — ABNORMAL LOW (ref 4.22–5.81)
RDW: 18.8 % — ABNORMAL HIGH (ref 11.5–15.5)
WBC: 8.7 10*3/uL (ref 4.0–10.5)
nRBC: 0 % (ref 0.0–0.2)

## 2023-12-06 LAB — CBG MONITORING, ED
Glucose-Capillary: 144 mg/dL — ABNORMAL HIGH (ref 70–99)
Glucose-Capillary: 215 mg/dL — ABNORMAL HIGH (ref 70–99)

## 2023-12-06 LAB — PROTIME-INR
INR: 1.6 — ABNORMAL HIGH (ref 0.8–1.2)
Prothrombin Time: 18.8 s — ABNORMAL HIGH (ref 11.4–15.2)

## 2023-12-06 LAB — POTASSIUM: Potassium: 6.2 mmol/L — ABNORMAL HIGH (ref 3.5–5.1)

## 2023-12-06 LAB — CREATININE, URINE, RANDOM: Creatinine, Urine: 176 mg/dL

## 2023-12-06 LAB — CREATININE, SERUM
Creatinine, Ser: 5.16 mg/dL — ABNORMAL HIGH (ref 0.61–1.24)
GFR, Estimated: 10 mL/min — ABNORMAL LOW (ref >60)

## 2023-12-06 LAB — MAGNESIUM: Magnesium: 1.8 mg/dL (ref 1.7–2.4)

## 2023-12-06 LAB — TROPONIN I (HIGH SENSITIVITY): Troponin I (High Sensitivity): 8290 ng/L (ref <18)

## 2023-12-06 LAB — APTT: aPTT: 37 s — ABNORMAL HIGH (ref 24–36)

## 2023-12-06 MED ORDER — SODIUM CHLORIDE 0.9 % IV SOLN
200.0000 mg | Freq: Once | INTRAVENOUS | Status: AC
  Administered 2023-12-07: 200 mg via INTRAVENOUS
  Filled 2023-12-06: qty 40

## 2023-12-06 MED ORDER — SODIUM CHLORIDE 0.9 % IV SOLN
2.0000 g | Freq: Once | INTRAVENOUS | Status: AC
  Administered 2023-12-06: 2 g via INTRAVENOUS
  Filled 2023-12-06: qty 20

## 2023-12-06 MED ORDER — DEXTROSE 50 % IV SOLN
1.0000 | Freq: Once | INTRAVENOUS | Status: AC
  Administered 2023-12-06: 50 mL via INTRAVENOUS
  Filled 2023-12-06: qty 50

## 2023-12-06 MED ORDER — APIXABAN 5 MG PO TABS: 5.0000 mg | ORAL_TABLET | Freq: Two times a day (BID) | ORAL | Status: DC

## 2023-12-06 MED ORDER — IPRATROPIUM-ALBUTEROL 0.5-2.5 (3) MG/3ML IN SOLN
3.0000 mL | Freq: Once | RESPIRATORY_TRACT | Status: AC
Start: 2023-12-06 — End: 2023-12-06
  Administered 2023-12-06: 3 mL via RESPIRATORY_TRACT
  Filled 2023-12-06: qty 3

## 2023-12-06 MED ORDER — ALBUTEROL SULFATE (2.5 MG/3ML) 0.083% IN NEBU
10.0000 mg | INHALATION_SOLUTION | Freq: Once | RESPIRATORY_TRACT | Status: AC
  Administered 2023-12-06: 10 mg via RESPIRATORY_TRACT
  Filled 2023-12-06: qty 12

## 2023-12-06 MED ORDER — ESCITALOPRAM OXALATE 10 MG PO TABS
10.0000 mg | ORAL_TABLET | Freq: Every day | ORAL | Status: DC
  Administered 2023-12-07 – 2023-12-30 (×24): 10 mg via ORAL
  Filled 2023-12-06 (×24): qty 1

## 2023-12-06 MED ORDER — DOCUSATE SODIUM 100 MG PO CAPS: 100.0000 mg | ORAL_CAPSULE | Freq: Two times a day (BID) | ORAL | Status: DC | PRN

## 2023-12-06 MED ORDER — SODIUM BICARBONATE 8.4 % IV SOLN
50.0000 meq | Freq: Once | INTRAVENOUS | Status: AC
  Administered 2023-12-06: 50 meq via INTRAVENOUS
  Filled 2023-12-06: qty 50

## 2023-12-06 MED ORDER — SODIUM ZIRCONIUM CYCLOSILICATE 10 G PO PACK
10.0000 g | PACK | Freq: Once | ORAL | Status: AC
  Administered 2023-12-06: 10 g via ORAL
  Filled 2023-12-06: qty 1

## 2023-12-06 MED ORDER — NOREPINEPHRINE 4 MG/250ML-% IV SOLN
2.0000 ug/min | INTRAVENOUS | Status: DC
  Administered 2023-12-07: 2 ug/min via INTRAVENOUS
  Filled 2023-12-06 (×2): qty 250

## 2023-12-06 MED ORDER — CLOPIDOGREL BISULFATE 75 MG PO TABS
75.0000 mg | ORAL_TABLET | Freq: Every day | ORAL | Status: DC
  Administered 2023-12-07 – 2023-12-08 (×2): 75 mg via ORAL
  Filled 2023-12-06 (×2): qty 1

## 2023-12-06 MED ORDER — IPRATROPIUM-ALBUTEROL 0.5-2.5 (3) MG/3ML IN SOLN
3.0000 mL | Freq: Once | RESPIRATORY_TRACT | Status: AC
  Administered 2023-12-06: 3 mL via RESPIRATORY_TRACT
  Filled 2023-12-06: qty 3

## 2023-12-06 MED ORDER — SODIUM CHLORIDE 0.9 % IV SOLN
500.0000 mg | Freq: Once | INTRAVENOUS | Status: AC
  Administered 2023-12-06: 500 mg via INTRAVENOUS
  Filled 2023-12-06: qty 5

## 2023-12-06 MED ORDER — FUROSEMIDE 10 MG/ML IJ SOLN
80.0000 mg | Freq: Once | INTRAMUSCULAR | Status: AC
  Administered 2023-12-06: 80 mg via INTRAVENOUS
  Filled 2023-12-06: qty 8

## 2023-12-06 MED ORDER — SODIUM CHLORIDE 0.9 % IV SOLN: 250.0000 mL | INTRAVENOUS | Status: AC

## 2023-12-06 MED ORDER — ONDANSETRON HCL 4 MG/2ML IJ SOLN
INTRAMUSCULAR | Status: AC
  Filled 2023-12-06: qty 2

## 2023-12-06 MED ORDER — CHLORHEXIDINE GLUCONATE CLOTH 2 % EX PADS
6.0000 | MEDICATED_PAD | Freq: Every day | CUTANEOUS | Status: DC
  Administered 2023-12-07 – 2023-12-29 (×20): 6 via TOPICAL

## 2023-12-06 MED ORDER — IPRATROPIUM-ALBUTEROL 0.5-2.5 (3) MG/3ML IN SOLN
3.0000 mL | Freq: Four times a day (QID) | RESPIRATORY_TRACT | Status: DC
Start: 2023-12-07 — End: 2023-12-07
  Administered 2023-12-07: 3 mL via RESPIRATORY_TRACT
  Filled 2023-12-06 (×2): qty 3

## 2023-12-06 MED ORDER — POLYETHYLENE GLYCOL 3350 17 G PO PACK
17.0000 g | PACK | Freq: Every day | ORAL | Status: DC | PRN
  Administered 2023-12-08 – 2023-12-16 (×2): 17 g via ORAL
  Filled 2023-12-06 (×2): qty 1

## 2023-12-06 MED ORDER — ALBUMIN HUMAN 25 % IV SOLN
25.0000 g | Freq: Once | INTRAVENOUS | Status: AC
  Administered 2023-12-06: 25 g via INTRAVENOUS
  Filled 2023-12-06: qty 100

## 2023-12-06 MED ORDER — ACETAMINOPHEN 325 MG PO TABS
650.0000 mg | ORAL_TABLET | ORAL | Status: DC | PRN
  Administered 2023-12-08 – 2023-12-25 (×12): 650 mg via ORAL
  Filled 2023-12-06 (×12): qty 2

## 2023-12-06 MED ORDER — SODIUM CHLORIDE 0.9 % IV BOLUS: 500.0000 mL | Freq: Once | INTRAVENOUS | Status: DC

## 2023-12-06 MED ORDER — INSULIN ASPART 100 UNIT/ML IJ SOLN
0.0000 [IU] | INTRAMUSCULAR | Status: DC
  Administered 2023-12-07: 5 [IU] via SUBCUTANEOUS
  Administered 2023-12-07: 3 [IU] via SUBCUTANEOUS
  Administered 2023-12-07: 5 [IU] via SUBCUTANEOUS

## 2023-12-06 MED ORDER — ATORVASTATIN CALCIUM 40 MG PO TABS: 80.0000 mg | ORAL_TABLET | Freq: Every day | ORAL | Status: DC

## 2023-12-06 MED ORDER — CALCIUM GLUCONATE 10 % IV SOLN
1.0000 g | Freq: Once | INTRAVENOUS | Status: AC
  Administered 2023-12-06: 1 g via INTRAVENOUS
  Filled 2023-12-06: qty 10

## 2023-12-06 MED ORDER — INSULIN ASPART 100 UNIT/ML IV SOLN
5.0000 [IU] | Freq: Once | INTRAVENOUS | Status: AC
  Administered 2023-12-06: 5 [IU] via INTRAVENOUS

## 2023-12-06 MED ORDER — ONDANSETRON HCL 4 MG/2ML IJ SOLN
4.0000 mg | Freq: Four times a day (QID) | INTRAMUSCULAR | Status: DC | PRN
Start: 2023-12-06 — End: 2023-12-30
  Administered 2023-12-07 – 2023-12-29 (×5): 4 mg via INTRAVENOUS
  Filled 2023-12-06 (×5): qty 2

## 2023-12-06 MED ORDER — PANTOPRAZOLE SODIUM 40 MG PO TBEC
40.0000 mg | DELAYED_RELEASE_TABLET | Freq: Every day | ORAL | Status: DC
  Administered 2023-12-07 – 2023-12-30 (×24): 40 mg via ORAL
  Filled 2023-12-06 (×25): qty 1

## 2023-12-06 MED ORDER — ONDANSETRON HCL 4 MG/2ML IJ SOLN
4.0000 mg | Freq: Three times a day (TID) | INTRAMUSCULAR | Status: DC | PRN
  Administered 2023-12-06: 4 mg via INTRAVENOUS

## 2023-12-06 MED ORDER — ORAL CARE MOUTH RINSE
15.0000 mL | OROMUCOSAL | Status: DC | PRN
Start: 2023-12-06 — End: 2023-12-30

## 2023-12-06 MED ORDER — SODIUM CHLORIDE 0.9 % IV SOLN
100.0000 mg | Freq: Every day | INTRAVENOUS | Status: AC
  Administered 2023-12-07 – 2023-12-08 (×2): 100 mg via INTRAVENOUS
  Filled 2023-12-06 (×4): qty 20

## 2023-12-06 MED ORDER — APIXABAN 2.5 MG PO TABS: 2.5000 mg | ORAL_TABLET | Freq: Two times a day (BID) | ORAL | Status: DC

## 2023-12-06 MED ORDER — DEXAMETHASONE SODIUM PHOSPHATE 10 MG/ML IJ SOLN
6.0000 mg | INTRAMUSCULAR | Status: DC
  Administered 2023-12-06 – 2023-12-10 (×5): 6 mg via INTRAVENOUS
  Filled 2023-12-06 (×5): qty 1

## 2023-12-06 MED ORDER — ENOXAPARIN SODIUM 30 MG/0.3ML IJ SOSY
30.0000 mg | PREFILLED_SYRINGE | INTRAMUSCULAR | Status: DC
Start: 2023-12-06 — End: 2023-12-06

## 2023-12-06 NOTE — ED Notes (Signed)
 Bladder scan over 

## 2023-12-06 NOTE — ED Notes (Signed)
 Date and time results received: 12/06/23 2219 (use smartphrase ".now" to insert current time)  Test: Trop Critical Value: 8290  Name of Provider Notified: Mertha Baars  Orders Received? Or Actions Taken?:

## 2023-12-06 NOTE — ED Triage Notes (Signed)
 Pt to the ed from home with a CC SOB starting at 1300 today. Pt was found at home in bed lying semi flat. Pt has low bp around 80/40 for ems. EMS started fluids and epi drip and placed pt on non rebreathe. Pt is A&Ox4 denies LOC. Pt relays he he started feeling bad today at 1300

## 2023-12-06 NOTE — Consult Note (Signed)
 Reason for Consult: Acute kidney injury on chronic kidney disease stage III, hyperkalemia, metabolic acidosis Referring Physician: Prentice Medicus MD (EDP)  HPI:  83 year old man with past medical history significant for hypertension, type 2 diabetes mellitus, coronary artery disease status post three-vessel CABG, peripheral vascular disease status post right below-knee amputation, status post left TMA, obstructive sleep apnea on CPAP, sick sinus syndrome status post PPM, idiopathic pulmonary fibrosis, HFpEF and chronic kidney disease stage IIIb (creatinine 1.7-1.9 at baseline).  Presented to the emergency room this afternoon with progressively worsening cough and shortness of breath and was found to be hypotensive (80/40) on initial assessment by EMS.  His symptoms have been progressive over the last 3 to 4 days and not reliably improved with furosemide  40 mg a.m./20 mg p.m. or nebulizers at home.  His wife reports recent adjustment to diuretic therapy because of abnormal kidney numbers but does not have access to those labs (done at PCP's-Richard Shepard, MD, [facility] office).  He reports some diarrhea about 3 days ago without hematochezia and earlier today started having some nausea without vomiting.  He denies any distinct chest pain.  Denies taking any nonsteroidal anti-inflammatory drugs and was taking an antihistamine for some URI relief.  Has noticed decreased urine output with no significant worsening of pedal edema or abdominal girth.  Labs in the emergency room were significant for hyperkalemia of 7.1, hyponatremia with sodium of 130, anion gap metabolic acidosis with a bicarbonate of 16, BUN 116/creatinine 4.7 with low albumin  of 2.9 and corrected calcium  of 8.6.  Urinalysis pending.  Labs also showed an elevated lactic acid level of 2.3 with hemoglobin of 9.5 and normal WBC count with mild lymphopenia.  Past Medical History:  Diagnosis Date   Acute blood loss anemia    Anxiety    AV block, [place]  1    Cataract    Chronic kidney disease    d/t DM   CKD (chronic kidney disease), stage III (HCC)    COVID-19 virus infection 07/18/2020   Last Assessment & Plan:   Formatting of this note might be different from the original.  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 10/2019 Echo: EF 55-60%, Gr2 DD. Nl RV.    Neuromuscular disorder (HCC)    Neuropathy associated with endocrine disorder (HCC)    Nonobstructive CAD (coronary artery disease)    a. 2012 Cath: mod, nonobs dzs; b. 10/2016 MV: EF 60%, no ischemia.   OSA on CPAP 05/05/2017   Unattended Home Sleep Test 7/2/813-AHI 38.6/hour, desaturation to 64%, body weight 261 pounds   PONV (postoperative nausea and vomiting)    Postoperative anemia due to acute blood loss 11/07/2020   Postoperative hemorrhagic shock 03/20/2021   Sleep apnea     uses cpap asked to bring mask and tubing    Past Surgical History:  Procedure Laterality Date   ABDOMINAL AORTOGRAM W/LOWER EXTREMITY N/A 12/10/2020   Procedure: ABDOMINAL AORTOGRAM W/LOWER EXTREMITY;  Surgeon:  Magda Debby SAILOR, MD;  Location: [facility];  Service: Cardiovascular;  Laterality: N/A;   AMPUTATION Right 01/22/2021   Procedure: 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;  Surgeon: Harden Jerona GAILS, MD;  Location: [facility] OR;  Service: Orthopedics;  Laterality: Right;   AMPUTATION Right 03/17/2021   Procedure: RIGHT BELOW KNEE AMPUTATION;  Surgeon: Harden Jerona GAILS, MD;  Location: [facility] OR;  Service: Orthopedics;  Laterality: Right;   ANKLE FUSION Right  01/22/2021   Procedure: RIGHT FOOT TIBIOCALCANEAL FUSION;  Surgeon: Harden Jerona GAILS, MD;  Location: [facility] OR;  Service: Orthopedics;  Laterality: Right;   ANTERIOR FUSION CERVICAL SPINE  2012   CARDIAC CATHETERIZATION  2011   CARDIAC CATHETERIZATION N/A 11/09/2016   Procedure: Right Heart Cath;  Surgeon: Victory LELON Sharps, MD;  Location: [facility] INVASIVE CV LAB;  Service: Cardiovascular;  Laterality: N/A;   carpel tunnel     left wrist   CATARACT EXTRACTION     CATARACT EXTRACTION W/ INTRAOCULAR LENS  IMPLANT, BILATERAL  2013   CERVICAL LAMINECTOMY  2012   COLONOSCOPY N/A 01/14/2013   Procedure: COLONOSCOPY;  Surgeon: Norleen SAILOR Kiang, MD;  Location: [facility] ENDOSCOPY;  Service: Endoscopy;  Laterality: N/A;   CORONARY ARTERY BYPASS GRAFT  11/04/2020   LIMA-LAD, SVG-OM1, SVG-PDA (Dr Norleen [facility]on [facility]) dc 11/18/2020   EYE SURGERY     I & D EXTREMITY Right 02/19/2021   Procedure: RIGHT ANKLE DEBRIDEMENT AND PLACEMENT ANTIBIOTIC BEADS;  Surgeon: Harden Jerona GAILS, MD;  Location: [facility] OR;  Service: Orthopedics;  Laterality: Right;   KNEE SURGERY  1998   left   LEFT HEART CATH AND CORONARY ANGIOGRAPHY N/A 07/10/2020   Procedure: LEFT HEART CATH AND CORONARY ANGIOGRAPHY;  Surgeon: Wonda Sharper, MD;  Location[facility] INVASIVE CV LAB;  Service: Cardiovascular;  Laterality: N/A;   LUMBAR LAMINECTOMY  2003   LUNG BIOPSY Left 12/26/2016   Procedure: LUNG BIOPSY;  Surgeon: Elspeth JAYSON Millers, MD;  Location: [facility] OR;  Service: Thoracic;  Laterality: Left;   PACEMAKER IMPLANT N/A 03/30/2020   Procedure: PACEMAKER IMPLANT;  Surgeon: Waddell Danelle LELON, MD;  Location: [facility];  Service: Cardiovascular;  Laterality: N/A;   PERIPHERAL VASCULAR INTERVENTION Right 12/10/2020   Procedure: PERIPHERAL VASCULAR INTERVENTION;  Surgeon: Magda Debby SAILOR, MD;  Location: [facility];  Service: Cardiovascular;  Laterality: Right;  SFA   POSTERIOR FUSION CERVICAL SPINE  2012   PPM GENERATOR REMOVAL N/A 12/14/2020   Procedure: PPM GENERATOR  REMOVAL;  Surgeon: Waddell Danelle LELON, MD;  Location: [facility];  Service: Cardiovascular;  Laterality: N/A;   TEE WITHOUT CARDIOVERSION N/A 12/11/2020   Procedure: TRANSESOPHAGEAL ECHOCARDIOGRAM (TEE);  Surgeon: Barbaraann Darryle Debby, MD;  Location: [facility] ENDOSCOPY;  Service: Cardiovascular;  Laterality: N/A;   TRIGGER FINGER RELEASE  2011   4th finger left hand   VIDEO ASSISTED THORACOSCOPY Left 12/26/2016   Procedure: VIDEO ASSISTED THORACOSCOPY;  Surgeon: Elspeth JAYSON Millers, MD;  Location: [facility] OR;  Service: Thoracic;  Laterality: Left;   VIDEO BRONCHOSCOPY N/A 12/26/2016   Procedure: VIDEO BRONCHOSCOPY;  Surgeon: Elspeth JAYSON Millers, MD;  Location: [facility] OR;  Service: Thoracic;  Laterality: N/A;    Family History  Problem Relation Age of Onset   Diabetes Mellitus II Mother    Emphysema Father 87   Heart attack Father    Colon cancer Neg Hx    Esophageal cancer Neg Hx    Rectal cancer Neg Hx    Stomach cancer Neg Hx  Social History:  reports that he has never smoked. He has never used smokeless tobacco. He reports that he does not drink alcohol and does not use drugs.  Allergies:  Allergies  Allergen Reactions   Codeine Hives and Itching   Ofev  [Nintedanib ] Diarrhea    SEVERE DIARRHEA   Pirfenidone  Diarrhea and Other (See Comments)    Esbriet  (Pirfenidone ) causes elevated LFTs. D/C on 06/14/17 and SEVERE DIARRHEA     Medications: I have reviewed the patient's current medications. Scheduled:  furosemide   80 mg Intravenous Once   sodium zirconium cyclosilicate   10 g Oral Once   Continuous:  albumin  human     azithromycin  500 mg (12/06/23 2053)      Latest Ref Rng & Units 12/06/2023    6:40 PM 06/24/2023    4:48 AM 06/22/2023   11:41 PM  BMP  Glucose 70 - 99 mg/dL 834  846  827   BUN 8 - 23 mg/dL 883  36  37   Creatinine 0.61 - 1.24 mg/dL 5.30  8.26  8.12   Sodium 135 - 145 mmol/L 130  134  137   Potassium 3.5 - 5.1 mmol/L 7.1  3.8  3.7   Chloride 98 - 111 mmol/L  100  99  97   CO2 22 - 32 mmol/L 16  28  29    Calcium  8.9 - 10.3 mg/dL 7.9  8.7  8.9       Latest Ref Rng & Units 12/06/2023    6:40 PM 06/24/2023    4:48 AM 06/22/2023    2:32 AM  CBC  WBC 4.0 - 10.5 K/uL 8.8  7.4  6.5   Hemoglobin 13.0 - 17.0 g/dL 9.5  88.2  89.2   Hematocrit 39.0 - 52.0 % 30.1  37.7  34.5   Platelets 150 - 400 K/uL 192  229  186     DG Chest Port 1 View Result Date: 12/06/2023 CLINICAL DATA:  SOB. EXAM: PORTABLE CHEST - 1 VIEW COMPARISON:  06/21/2023. FINDINGS: Cardiac silhouette is prominent. There is pulmonary interstitial prominence with vascular congestion. No focal consolidation. No pneumothorax. There are bilateral pleural effusions. 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 IMPRESSION: Findings suggest CHF. Electronically Signed   By: Fonda Field M.D.   On: 12/06/2023 19:13    Review of Systems  Constitutional:  Positive for appetite change and fatigue. Negative for chills and fever.  HENT:  Positive for congestion. Negative for nosebleeds, sinus pressure and sore throat.   Eyes:  Negative for redness and visual disturbance.  Respiratory:  Positive for cough and shortness of breath.   Cardiovascular:  Negative for chest pain and leg swelling.  Gastrointestinal:  Positive for diarrhea, nausea and vomiting. Negative for abdominal pain and blood in stool.  Endocrine: Negative for polyuria.  Genitourinary:  Positive for decreased urine volume. Negative for dysuria, enuresis and hematuria.  Musculoskeletal:  Negative for back pain.  Skin:  Positive for wound.       Left foot status post TMA  Neurological:  Positive for dizziness and weakness. Negative for light-headedness and headaches.   Blood pressure 98/78, pulse 60, temperature 98.6 F (37 C), temperature source Oral, resp. rate (!) 23, height 6' (1.829 m), weight 102 kg, SpO2 100%. Physical Exam Vitals reviewed.  Constitutional:      Appearance: He is well-developed.  He is obese. He is ill-appearing.  HENT:     Head: Normocephalic and atraumatic.  Mouth/Throat:     Pharynx: Oropharynx is clear.  Eyes:     Extraocular Movements: Extraocular movements intact.  Neck:     Vascular: JVD present.  Cardiovascular:     Rate and Rhythm: Regular rhythm. Bradycardia present.     Heart sounds: No murmur heard. Pulmonary:     Effort: Accessory muscle usage present.     Breath sounds: Examination of the right-middle field reveals wheezing and rhonchi. Examination of the left-middle field reveals wheezing and rhonchi. Examination of the right-lower field reveals decreased breath sounds. Examination of the left-lower field reveals decreased breath sounds. Decreased breath sounds, wheezing and rhonchi present.  Abdominal:     Palpations: Abdomen is soft. There is no mass.     Tenderness: There is no guarding.  Musculoskeletal:        General: Normal range of motion.     Left lower leg: Edema present.     Comments: Status post right BKA, trace-1+ left lower extremity edema with intact dressing left forefoot  Skin:    General: Skin is warm and dry.     Coloration: Skin is not pale.  Neurological:     Mental Status: He is oriented to person, place, and time.     Assessment/Plan: 1.  Acute kidney injury: Appears to be hemodynamically mediated possibly from a combination of CHF exacerbation +/- sepsis with evolution to ATN.  Will attempt supportive management with blood pressure support (on low-dose pressor and ordered for albumin  bolus) and efforts at augmenting diuresis with furosemide .  If this is not tolerated or unsuccessful, will likely need to initiate CRRT; this may also be started acutely if patient continues to decompensate with worsening acidosis or hyperkalemia refractory to medical management.  Urinalysis, urine electrolytes and renal ultrasound requested.  Avoid iodinated intravenous contrast unless emergently indicated for lifesaving procedure.  Avoid  morphine and renally dose medications. 2.  Hyperkalemia: Not on supplementation prior to admission and per his wife, not on high potassium diet.  Attempt medical management for potassium lowering with treatment already initiated by EDP-Lokelma , albuterol  MDI and diuresis. 3.  Anion gap metabolic acidosis: Appears to be from a combination of acute kidney injury and lactic acidosis associated with shock.  Initiate supportive management with pressors. 4.  Shock: Appears to be largely cardiogenic but cannot rule out septic component with presentation symptoms suggestive of LRTI.  He has been started on antibiotic coverage for community-acquired pneumonia. 5.  Acute hypoxic respiratory failure: Appears to be from a combination of CHF exacerbation +/- underlying interstitial lung disease and now LRTI/community-acquired pneumonia.  On oxygen supplementation and antimicrobial coverage. 6.  Anemia: Monitor hemoglobin/hematocrit trend anticipating some improvement with diuresis.  Gordy MARLA Blanch 12/06/2023, 9:08 PM

## 2023-12-06 NOTE — ED Notes (Signed)
 Date and time results received: 12/06/23 2147 (use smartphrase ".now" to insert current time)  Test: K+ Critical Value: > 7.5  Name of Provider Notified: Cherlynn Polo, [place] INtensivist  Orders Received? Or Actions Taken?:

## 2023-12-06 NOTE — ED Provider Notes (Signed)
[place] EMERGENCY DEPARTMENT AT [facility] Provider Note   CSN: [id number] Arrival date & time: 12/06/23  1753     History  Chief Complaint  Patient presents with   Shortness of Breath    Shane [name] is a 83 y.o. male.  83 year old male with past medical history of peripheral vascular disease, coronary artery disease, diabetes, and interstitial lung disease presenting to the emergency department today with cough and shortness of breath.  The patient states that he has been having a cough over the past few days.  He reports is productive of green sputum.  He has had some chills with this.  He states that today he became very short of breath and was unable to catch his breath.  Prior to today he has been using his nebulizer treatments at home which do seem to be helping.  He denies any chest pain with this.  Denies any leg pain or swelling.  He reports that his wife is currently sick with upper respiratory symptoms as well.  He came to the emergency department due to this today.  When medics arrived he apparently was hypotensive and hypoxic.  He was placed on nonrebreather and started on an epinephrine  infusion on the way to the emergency department.   Shortness of Breath Associated symptoms: cough        Home Medications Prior to Admission medications   Medication Sig Start Date End Date Taking? Authorizing Provider  albuterol  (PROVENTIL ) (2.5 MG/3ML) 0.083% nebulizer solution Take 2.5 mg by nebulization 2 (two) times daily as needed for wheezing or shortness of breath.   Yes [provider]  albuterol  (VENTOLIN  HFA) 108 (90 Base) MCG/ACT inhaler Inhale 2 puffs into the lungs every 4 (four) hours as needed for wheezing or shortness of breath. Patient taking differently: Inhale 3 puffs into the lungs every 4 (four) hours as needed for wheezing or shortness of breath. 04/12/21  Yes Angiulli, Toribio PARAS, PA-C  apixaban  (ELIQUIS ) 5 MG TABS tablet Take 1 tablet (5 mg  total) by mouth 2 (two) times daily. Patient taking differently: Take 2.5 mg by mouth 2 (two) times daily. 04/12/21  Yes Angiulli, Toribio PARAS, PA-C  Ascorbic Acid  (VITAMIN C  WITH ROSE HIPS) 500 MG tablet Take 1 tablet (500 mg total) by mouth daily. 04/12/21  Yes Angiulli, Toribio PARAS, PA-C  atorvastatin  (LIPITOR ) 80 MG tablet TAKE 1 TABLET BY MOUTH EVERY DAY Patient taking differently: Take 80 mg by mouth at bedtime. 07/25/22  Yes Pietro Redell RAMAN, MD  clopidogrel  (PLAVIX ) 75 MG tablet Take 75 mg by mouth daily. 09/04/22  Yes [provider]  diphenhydrAMINE  HCl, Sleep, (ZZZQUIL PO) Take 15 mLs by mouth at bedtime as needed (cough).   Yes [provider]  docusate sodium  (COLACE) 100 MG capsule Take 1 capsule (100 mg total) by mouth daily. Patient taking differently: Take 100 mg by mouth as needed for mild constipation or moderate constipation. 04/13/21  Yes Angiulli, Toribio PARAS, PA-C  escitalopram  (LEXAPRO ) 10 MG tablet Take 1 tablet (10 mg total) by mouth daily. 04/12/21  Yes Angiulli, Toribio PARAS, PA-C  FARXIGA  10 MG TABS tablet Take 10 mg by mouth daily.   Yes [provider]  furosemide  (LASIX ) 20 MG tablet Take 3 tablets (60 mg total) by mouth daily. Patient taking differently: Take 20-40 mg by mouth See admin instructions. Take 2 tablets by mouth in the morning and 1 tablet at night 06/24/23 12/05/24 Yes Ezenduka, Nkeiruka J, MD  HUMALOG   KWIKPEN 100 UNIT/ML KwikPen Inject 10-15 Units into the skin 3 (three) times daily. Depending what his blood sugar reading is   Yes [provider]  HYDROMET 5-1.5 MG/5ML syrup Take 5 mLs by mouth as needed for cough. 05/29/23  Yes [provider]  lisinopril  (ZESTRIL ) 10 MG tablet Take 10 mg by mouth daily. 08/22/23  Yes [provider]  LORazepam  (ATIVAN ) 1 MG tablet Take 1 tablet (1 mg total) by mouth at bedtime. Patient taking differently: Take 1 mg by mouth at bedtime as needed (sleep). 04/12/21  Yes Angiulli, Toribio PARAS, PA-C   metoprolol  tartrate (LOPRESSOR ) 25 MG tablet Take 25 mg by mouth daily.   Yes [provider]  Multiple Vitamin (MULTIVITAMIN WITH MINERALS) TABS tablet Take 1 tablet by mouth daily.   Yes [provider]  mupirocin  ointment (BACTROBAN ) 2 % Apply 1 Application topically at bedtime. 06/06/23  Yes [provider]  nitroGLYCERIN  (NITRODUR - DOSED IN MG/24 HR) 0.2 mg/hr patch Place 1 patch (0.2 mg total) onto the skin daily. 11/23/23  Yes Harden Jerona GAILS, MD  Olopatadine HCl (PATADAY OP) Place 2 drops into both eyes daily as needed (allergies).   Yes [provider]  ondansetron  (ZOFRAN ) 4 MG tablet Take 4 mg by mouth as needed for nausea or vomiting. 06/15/23  Yes [provider]  pantoprazole  (PROTONIX ) 40 MG tablet Take 1 tablet (40 mg total) by mouth 2 (two) times daily. Patient taking differently: Take 40 mg by mouth daily. 04/12/21  Yes Angiulli, Daniel J, PA-C  polyethylene glycol powder (GLYCOLAX /MIRALAX ) 17 GM/SCOOP powder Take 17 g by mouth every other day. 11/15/20  Yes [provider]  traZODone  (DESYREL ) 50 MG tablet Take 75 mg by mouth at bedtime as needed for sleep. 05/11/23  Yes [provider]  TRESIBA  FLEXTOUCH 100 UNIT/ML FlexTouch Pen Inject 20 Units into the skin daily. 12/22/22  Yes [provider]  zinc  sulfate 220 (50 Zn) MG capsule Take 1 capsule (220 mg total) by mouth daily. 04/12/21  Yes Angiulli, Toribio PARAS, PA-C  gabapentin  (NEURONTIN ) 300 MG capsule TAKE 1 CAPSULE BY MOUTH THREE TIMES A DAY Patient taking differently: Take 300 mg by mouth 2 (two) times daily. 12/14/21   Valdemar Rocky SAUNDERS, NP      Allergies    Codeine, Ofev  [nintedanib ], and Pirfenidone     Review of Systems   Review of Systems  Respiratory:  Positive for cough and shortness of breath.   All other systems reviewed and are negative.   Physical Exam Updated Vital Signs BP (!) 93/40   Pulse 61   Temp 98.5 F (36.9 C) (Oral)   Resp (!) 21    Ht 6' (1.829 m)   Wt 102 kg   SpO2 100%   BMI 30.50 kg/m  Physical Exam Vitals and nursing note reviewed.   Gen: Mild conversational dyspnea noted Eyes: PERRL, EOMI HEENT: no oropharyngeal swelling Neck: trachea midline Resp: Diminished with diffuse wheezing throughout all lung fields Card: RRR, no murmurs, rubs, or gallops Abd: nontender, nondistended Extremities: R BKA, L TMA noted, no calf tenderness Neuro: no focal deficits Skin: no rashes Psyc: acting appropriately   ED Results / Procedures / Treatments   Labs (all labs ordered are listed, but only abnormal results are displayed) Labs Reviewed  RESP PANEL BY RT-PCR (RSV, FLU A&B, COVID)  RVPGX2 - Abnormal; Notable for the following components:      Result Value   SARS Coronavirus 2 by RT PCR  POSITIVE (*)    All other components within normal limits  COMPREHENSIVE METABOLIC PANEL - Abnormal; Notable for the following components:   Sodium 130 (*)    Potassium 7.1 (*)    CO2 16 (*)    Glucose, Bld 165 (*)    BUN 116 (*)    Creatinine, Ser 4.69 (*)    Calcium  7.9 (*)    Albumin  2.9 (*)    AST 45 (*)    GFR, Estimated 12 (*)    All other components within normal limits  CBC WITH DIFFERENTIAL/PLATELET - Abnormal; Notable for the following components:   RBC 3.49 (*)    Hemoglobin 9.5 (*)    HCT 30.1 (*)    RDW 19.1 (*)    Lymphs Abs 0.5 (*)    Monocytes Absolute 1.6 (*)    All other components within normal limits  PROTIME-INR - Abnormal; Notable for the following components:   Prothrombin Time 18.8 (*)    INR 1.6 (*)    All other components within normal limits  APTT - Abnormal; Notable for the following components:   aPTT 37 (*)    All other components within normal limits  BASIC METABOLIC PANEL - Abnormal; Notable for the following components:   Sodium 129 (*)    Potassium >7.5 (*)    Chloride 96 (*)    CO2 18 (*)    Glucose, Bld 165 (*)    BUN 118 (*)    Creatinine, Ser 4.79 (*)    Calcium  8.3 (*)     GFR, Estimated 11 (*)    All other components within normal limits  URINALYSIS, ROUTINE W REFLEX MICROSCOPIC - Abnormal; Notable for the following components:   APPearance HAZY (*)    Glucose, UA 50 (*)    All other components within normal limits  I-STAT CG4 LACTIC ACID, ED - Abnormal; Notable for the following components:   Lactic Acid, Venous 2.3 (*)    All other components within normal limits  CBG MONITORING, ED - Abnormal; Notable for the following components:   Glucose-Capillary 144 (*)    All other components within normal limits  CBG MONITORING, ED - Abnormal; Notable for the following components:   Glucose-Capillary 215 (*)    All other components within normal limits  TROPONIN I (HIGH SENSITIVITY) - Abnormal; Notable for the following components:   Troponin I (High Sensitivity) 8,290 (*)    All other components within normal limits  CULTURE, BLOOD (ROUTINE X 2)  CULTURE, BLOOD (ROUTINE X 2)  MRSA NEXT GEN BY PCR, NASAL  CREATININE, URINE, RANDOM  SODIUM, URINE, RANDOM  CREATININE, SERUM  CBC  BASIC METABOLIC PANEL  MAGNESIUM   PHOSPHORUS  MAGNESIUM   PHOSPHORUS  PROCALCITONIN  BRAIN NATRIURETIC PEPTIDE  POTASSIUM  HEMOGLOBIN A1C  CBC  I-STAT CG4 LACTIC ACID, ED  TROPONIN I (HIGH SENSITIVITY)    EKG EKG Interpretation Date/Time:  Wednesday December 06 2023 18:12:23 EST Ventricular Rate:  88 PR Interval:    QRS Duration:  166 QT Interval:  392 QTC Calculation: 475 R Axis:   157  Text Interpretation: Paced rhythm Nonspecific intraventricular conduction delay Anterolateral infarct, age indeterminate Nonspecific ST-T changes Confirmed by Ula Barter [phone]) on 12/06/2023 6:19:46 PM  Radiology US  RENAL Result Date: 12/06/2023 CLINICAL DATA:  Acute renal insufficiency EXAM: RENAL / URINARY TRACT ULTRASOUND COMPLETE COMPARISON:  04/27/2018 FINDINGS: Right Kidney: Renal measurements: 11.8 x 5.2 x 4.3 cm = volume: 137.2 mL. Echogenicity within normal limits. No mass or  hydronephrosis visualized. Left Kidney: Renal measurements: 11.7 x 5.6 x 4.5 cm = volume: 154.4 mL. Echogenicity within normal limits. No mass or hydronephrosis visualized. Bladder: Appears normal for degree of bladder distention. Other: None. IMPRESSION: 1. Unremarkable renal ultrasound. Electronically Signed   By: Ozell Daring M.D.   On: 12/06/2023 22:10   DG Chest Port 1 View Result Date: 12/06/2023 CLINICAL DATA:  SOB. EXAM: PORTABLE CHEST - 1 VIEW COMPARISON:  06/21/2023. FINDINGS: Cardiac silhouette is prominent. There is pulmonary interstitial prominence with vascular congestion. No focal consolidation. No pneumothorax. There are bilateral pleural effusions. 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 IMPRESSION: Findings suggest CHF. Electronically Signed   By: Fonda Field M.D.   On: 12/06/2023 19:13    Procedures Procedures    Medications Ordered in ED Medications  ondansetron  (ZOFRAN ) 4 MG/2ML injection (  Not Given 12/06/23 2120)  docusate sodium  (COLACE) capsule 100 mg (has no administration in time range)  polyethylene glycol (MIRALAX  / GLYCOLAX ) packet 17 g (has no administration in time range)  acetaminophen  (TYLENOL ) tablet 650 mg (has no administration in time range)  insulin  aspart (novoLOG ) injection 0-9 Units (has no administration in time range)  ondansetron  (ZOFRAN ) injection 4 mg (has no administration in time range)  remdesivir  200 mg in sodium chloride  0.9% 250 mL IVPB (has no administration in time range)    Followed by  remdesivir  100 mg in sodium chloride  0.9 % 100 mL IVPB (has no administration in time range)  dexamethasone  (DECADRON ) injection 6 mg (6 mg Intravenous Given 12/06/23 2315)  clopidogrel  (PLAVIX ) tablet 75 mg (has no administration in time range)  escitalopram  (LEXAPRO ) tablet 10 mg (has no administration in time range)  pantoprazole  (PROTONIX ) EC tablet 40 mg (has no administration in time range)   ipratropium-albuterol  (DUONEB) 0.5-2.5 (3) MG/3ML nebulizer solution 3 mL (has no administration in time range)  apixaban  (ELIQUIS ) tablet 2.5 mg (has no administration in time range)  cefTRIAXone  (ROCEPHIN ) 2 g in sodium chloride  0.9 % 100 mL IVPB (0 g Intravenous Stopped 12/06/23 1908)  azithromycin  (ZITHROMAX ) 500 mg in sodium chloride  0.9 % 250 mL IVPB (0 mg Intravenous Stopped 12/06/23 2153)  ipratropium-albuterol  (DUONEB) 0.5-2.5 (3) MG/3ML nebulizer solution 3 mL (3 mLs Nebulization Given 12/06/23 1826)  ipratropium-albuterol  (DUONEB) 0.5-2.5 (3) MG/3ML nebulizer solution 3 mL (3 mLs Nebulization Given 12/06/23 1826)  ipratropium-albuterol  (DUONEB) 0.5-2.5 (3) MG/3ML nebulizer solution 3 mL (3 mLs Nebulization Given 12/06/23 1826)  sodium zirconium cyclosilicate  (LOKELMA ) packet 10 g (10 g Oral Given 12/06/23 2230)  calcium  gluconate inj 10% (1 g) URGENT USE ONLY! (1 g Intravenous Given 12/06/23 2100)  albuterol  (PROVENTIL ) (2.5 MG/3ML) 0.083% nebulizer solution 10 mg (10 mg Nebulization Given by Other 12/06/23 2059)  insulin  aspart (novoLOG ) injection 5 Units (5 Units Intravenous Given 12/06/23 2057)    And  dextrose  50 % solution 50 mL (50 mLs Intravenous Given 12/06/23 2101)  sodium bicarbonate  injection 50 mEq (50 mEq Intravenous Given 12/06/23 2102)  furosemide  (LASIX ) injection 80 mg (80 mg Intravenous Given 12/06/23 2226)  albumin  human 25 % solution 25 g (0 g Intravenous Stopped 12/06/23 2220)    ED Course/ Medical Decision Making/ A&P                                 Medical Decision Making 83 year old male with past medical history of diabetes, chronic kidney disease, interstitial lung disease, and obstructive  sleep apnea presenting to the emergency department today with productive cough and shortness of breath.  The patient is found to be hypoxic with medics initially.  I will further evaluate him here with a sepsis workup.  Will cover him empirically with Rocephin  and azithromycin  for possible  community-acquired pneumonia.  The patient's blood pressure is actually normal here but he was briefly started on epinephrine  infusion.  This is been discontinued now.  I will hold off on fluids with his history of CHF to see what his blood pressure is here.  Will give him DuoNebs here as he does have wheezing here on exam.  Also obtain a COVID and flu swab on the patient as he does have known sick contacts.  I will reevaluate for ultimate disposition will likely require admission.  The patient's chest x-ray does shows findings concerning for CHF.  His potassium did come back significantly elevated and he does have an AKI.  The patient is given hyperkalemia medications here in the emergency department.  I did call discuss his case with Dr. Tobie.  We will repeat a potassium and he will come down and evaluate the patient.  Initially I was planning on ordering an MRI because the patient was having some weakness on the left side that started yesterday.  He states that this improved with the nebulizer treatments.  I suspect that this very well may be due to his hyperkalemia since this did improve with treatment.  He does have equal strength and sensation on my evaluation here.  He may need this at some point during his hospitalization but I will hold off on this as I think close monitoring is more important as the patient reports that the symptoms began yesterday which is greater than 24 hours ago and he is clearly outside the window for any acute intervention at this time he does not have any objective findings here on exam.  I did call and discussed the patient's case with our intensive care team.  He will be admitted to the ICU overnight.  The patient is denying any chest pain but troponin is ordered and pending at the time of admission.  The patient's EKG was repeated and does show some QRS widening with normal QTc.  Again, this does appear similar morphology to previous EKGs in our system.  He is admitted for  further evaluation management.  CRITICAL CARE Performed by: Prentice JONELLE Medicus   Total critical care time: 45 minutes  Critical care time was exclusive of separately billable procedures and treating other patients.  Critical care was necessary to treat or prevent imminent or life-threatening deterioration.  Critical care was time spent personally by me on the following activities: development of treatment plan with patient and/or surrogate as well as nursing, discussions with consultants, evaluation of patient's response to treatment, examination of patient, obtaining history from patient or surrogate, ordering and performing treatments and interventions, ordering and review of laboratory studies, ordering and review of radiographic studies, pulse oximetry and re-evaluation of patient's condition.   Amount and/or Complexity of Data Reviewed Labs: ordered. Radiology: ordered.  Risk OTC drugs. Prescription drug management. Decision regarding hospitalization.           Final Clinical Impression(s) / ED Diagnoses Final diagnoses:  Hyperkalemia  Acute on chronic congestive heart failure, unspecified heart failure type [facility] [place])    Rx / DC Orders ED Discharge Orders     None         Medicus Prentice JONELLE, MD  12/06/23 2322  

## 2023-12-06 NOTE — H&P (Addendum)
 NAME:  Shane [name], MRN:  [id number], DOB:  [birth year], LOS: 0 ADMISSION DATE:  12/06/2023, CONSULTATION DATE:  12/06/2023 REFERRING MD:  Ula, EDP, CHIEF COMPLAINT:  aki, hypotension  History of Present Illness:  83 year old male with significant past medical history including obstructive sleep apnea on CPAP at night, CAD s/p CABG x3 in 2021, aortic stenosis s/p TAVR 08/2023 at [facility], HFpEF, atrial fibrillation on Eliquis , plavix , sick sinus syndrome s/p leadless PPM, hypertension, hyperlipidemia, GERD, uncontrolled diabetes, CKD 3, pulmonary fibrosis followed by Dr. Geronimo, who presented to the emergency department on 12/06/2023 with shortness of breath.  Per the ED provider complaining of cough and shortness of breath over the past 3 days.  Using his nebulizers at home with no improvement in his symptoms.  EMS was called and found to be hypotensive and hypoxic.  He was started on nonrebreather and an epinephrine  infusion prior to arrival.  In the ED afebrile, SBP around 100, tachypneic.  Labs +COVID, AKI on CKD with BUN/Cr 116, 4.69 with critical K of 7.1. bicarb 16. CBC without leukocytosis, hgb 9.5. lactic 2.3>1.9, troponin 8,290, UA negative, blood cultures pending. CXR with likely pulmonary edema. With new AKI, hyperkalemia and EKG changes, nephrology was consulted. Patient given lokelma , albuterol , Lasix , sodium bicarb, 5U insulin  with D50, calcium  gluconate. Was also given azithromycin /rocephin  CAP coverage. Neprhology recommended albumin  for BP support (initially on 2mcg levo, now off), Lasix  80mg  IV, renal ultrasound. Pending repeat BMP on consult for admission to ICU.  On my exam, laying in bed, mildly tachypneic on albuterol  treatment. Alert and oriented. Endorses the above story. He does state he had diarrhea over the preceding 2 days and intermittent nausea without vomiting. Decreased appetite. He does feel volume overloaded from his baseline. States his abdomen feels tighter. Wife is at  bedside, endorses story.   Pertinent  Medical History  obstructive sleep apnea on CPAP at night, CAD s/p CABG x3 in 2021, aortic stenosis s/p TAVR 08/2023 at [facility], HFpEF, atrial fibrillation on Eliquis , plavix , sick sinus syndrome s/p leadless PPM, hypertension, hyperlipidemia, GERD, uncontrolled diabetes, CKD 3, pulmonary fibrosis  Significant Hospital Events: Including procedures, antibiotic start and stop dates in addition to other pertinent events   1/1: Admit to CCM for covid pneumonia, aki with hyperK likely needing CRRT   Interim History / Subjective:  Patient is alert and oriented.  On nonrebreather on initial exam getting albuterol  treatment.  Currently denying any pain.  Does feel mildly short of breath and volume overloaded.  Objective   Blood pressure 106/84, pulse 72, temperature 98.6 F (37 C), temperature source Oral, resp. rate 19, height 6' (1.829 m), weight 102 kg, SpO2 100%.        Intake/Output Summary (Last 24 hours) at 12/06/2023 2231 Last data filed at 12/06/2023 2220 Gross per 24 hour  Intake 383.25 ml  Output --  Net 383.25 ml   Filed Weights   12/06/23 1805  Weight: 102 kg    Examination: General: Older male, laying in bed, mild respiratory distress with tachypnea, not an extremis HENT: [place]/AT, anicteric sclera, mucous membranes dry Lungs: Rales diffusely Cardiovascular: S1-S2 no appreciable murmur, rub, gallop, frequent ectopy Abdomen: Rounded, soft and nondistended Extremities: No significant pitting edema, left transmetatarsal amputation, right BKA Neuro: Alert, oriented, nonfocal GU: Foley  Resolved Hospital Problem list    Assessment & Plan:  Acute hypoxic respiratory failure COVID-19 positive Obstructive sleep apnea on CPAP at night Pulmonary fibrosis Acute hypoxic respiratory failure likely multifactorial in the setting  of acute COVID-19 upper respiratory infection in the background of pulmonary fibrosis, obstructive sleep apnea, CHF.  On 2 L  baseline at home with CPAP at night.  Presents hypoxic initially requiring nonrebreather with EMS now on nasal cannula 5 L.  COVID-19 positive.  Chest x-ray that does demonstrate some pulmonary edema in the setting of CHF and clinically has rales diffusely. Received x1 dose azithro/rocephin  in ED for CAP. Not febrile, no WBC. Lower suspicion for superimposed bacterial infection at present.  -Lasix  80 mg IV -Remdesivir  and Decadron  - f/u procal - no CAP coverage for now, but can add if procal significantly elevated.  -DuoNeb every 6 hours -Titrate nasal cannula to maintain O2 sat greater than 92% -BiPAP nightly  Acute kidney injury on chronic kidney disease; baseline creatinine 1.7-1.9 Hyperkalemia with EKG changes Non anion gap metabolic acidosis Initial labs with BUN 116, sCr 4.69, potassium 7.1.  AKI also likely multifactorial in the setting of CHF, hemodynamic instability with hypotension initially reported.  May also be sequela of sepsis with ATN.  Temporized in the ED with insulin /dextrose , calcium  gluconate, sodium bicarb, albuterol , Lokelma .  Renal ultrasound is normal.  NAGMA likely sequela of renal dysfunction, also had double days of diarrhea. - repeat potassium now after temporization - if repeat potassium still elevated will need HD cath and CRRT - f/u mag, phos - nephrology consulted and has evaluated the patient, appreciate recs - f/u urine studies - trend bmp, mag, phos - replete elytes - strict I&O, insert Foley catheter - avoid nephrotoxic agents, renally dose medications - ensure adequate renal perfusion   Acute on chronic heart failure exacerbation; most recent echo 06/2023 EF 50-55%, LVH, G3DD, RV mod reduced, LA severely dilated, RA mod dilated Elevated troponin; troponin in the ED 8,290.  Repeat pending.  EKG with paced rhythm.  Known significant cardiac disease.  He does report having episodes of chest pain over the past few days with his shortness of breath. May be  demand related in setting of acute CHF exacerbation - f/u BNP - trend troponin to peak - f/u echo - will consult cards, appreciate rec - repeat EKG with any new chest pain  Acute on chronic anemia; appears to have slow decrease. 10 months ago 12.9, 5 months ago ~11. 9.5 on admission. May be anemia of chronic disease r/t CKD. No signs of active bleeding. Could also have component of dilution in settting of FVO.  - will order iron  studies for completion  - monitor for bleeding  - trend cbc   Type 2 diabetes; poorly controlled, most recent A1c 9  - f/u A1c - SSI  - cbg q4h   Hypertension  Hyperlipidemia - hold statin while on remdesivir , then restart  - hold antihypertensives in setting of hypotension (initially on vasopressor support now off). Restart when clinically appropriate   CAD HFpEF Atrial fibrillation on Eliquis  Sick sinus syndrome s/p leadless PPM Aortic stenosis s/p TAVR - tele monitoring  - con't eliquis , plavix  beginning tomorrow, hold tonight for possible HD cath placement  - consult cards [id number] leak  - f/u echo  Best Practice (right click and Reselect all SmartList Selections daily)   Diet/type: NPO DVT prophylaxis: LMWH Pressure ulcer(s): Left foot wound, POA GI prophylaxis: N/A Lines: N/A Foley:  Yes, and it is still needed Code Status:  full code Last date of multidisciplinary goals of care discussion [updated wife and patient at bedside on plan of care.  Admit to ICU.  Recheck potassium and will likely  need HD cath if temporization is unsuccessful.]  Labs   CBC: Recent Labs  Lab 12/06/23 1840  WBC 8.8  NEUTROABS 6.7  HGB 9.5*  HCT 30.1*  MCV 86.2  PLT 192    Basic Metabolic Panel: Recent Labs  Lab 12/06/23 1840 12/06/23 2020  NA 130* 129*  K 7.1* >7.5*  CL 100 96*  CO2 16* 18*  GLUCOSE 165* 165*  BUN 116* 118*  CREATININE 4.69* 4.79*  CALCIUM  7.9* 8.3*   GFR: Estimated Creatinine Clearance: 14.7 mL/min (A) (by C-G formula  based on SCr of 4.79 mg/dL (H)). Recent Labs  Lab 12/06/23 1840 12/06/23 1852 12/06/23 2026  WBC 8.8  --   --   LATICACIDVEN  --  2.3* 1.9    Liver Function Tests: Recent Labs  Lab 12/06/23 1840  AST 45*  ALT 16  ALKPHOS 85  BILITOT 0.6  PROT 6.8  ALBUMIN  2.9*   No results for input(s): LIPASE, AMYLASE in the last 168 hours. No results for input(s): AMMONIA in the last 168 hours.  ABG    Component Value Date/Time   PHART 7.362 12/27/2016 0334   PCO2ART 45.4 12/27/2016 0334   PO2ART 115 (H) 12/27/2016 0334   HCO3 25.1 12/27/2016 0334   TCO2 25 02/19/2021 1419   ACIDBASEDEF TEST WILL BE CREDITED 12/19/2016 1333   O2SAT 95.7 12/27/2016 0334     Coagulation Profile: Recent Labs  Lab 12/06/23 1840  INR 1.6*    Cardiac Enzymes: No results for input(s): CKTOTAL, CKMB, CKMBINDEX, TROPONINI in the last 168 hours.  HbA1C: Hgb A1c MFr Bld  Date/Time Value Ref Range Status  06/24/2023 04:48 AM 9.0 (H) 4.8 - 5.6 % Final    Comment:    (NOTE) Pre diabetes:          5.7%-6.4%  Diabetes:              >6.4%  Glycemic control for   <7.0% adults with diabetes   02/15/2021 05:54 PM 7.8 (H) 4.8 - 5.6 % Final    Comment:    (NOTE) Pre diabetes:          5.7%-6.4%  Diabetes:              >6.4%  Glycemic control for   <7.0% adults with diabetes     CBG: Recent Labs  Lab 12/06/23 2018 12/06/23 2140  GLUCAP 144* 215*    Review of Systems:   As above  Past Medical History:  He,  has a past medical history of Acute blood loss anemia, Anxiety, AV block, Mobitz 1, Cataract, Chronic kidney disease, CKD (chronic kidney disease), stage III (HCC), COVID-19 virus infection (07/18/2020), Depression, Diabetes mellitus, Dyspnea, GERD (gastroesophageal reflux disease), History of kidney stones, Hyperlipidemia, Hypertension, 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   Surgical History:   Past Surgical History:  Procedure Laterality Date   ABDOMINAL AORTOGRAM W/LOWER EXTREMITY N/A 12/10/2020   Procedure: ABDOMINAL AORTOGRAM W/LOWER EXTREMITY;  Surgeon: Magda Debby SAILOR, MD;  Location: [facility] INVASIVE CV LAB;  Service: Cardiovascular;  Laterality: N/A;   AMPUTATION Right 01/22/2021   Procedure: 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;  Surgeon: Harden Jerona GAILS, MD;  Location: [facility] OR;  Service: Orthopedics;  Laterality: Right;  AMPUTATION Right 03/17/2021   Procedure: RIGHT BELOW KNEE AMPUTATION;  Surgeon: Harden Jerona GAILS, MD;  Location: [facility] OR;  Service: Orthopedics;  Laterality: Right;   ANKLE FUSION Right 01/22/2021   Procedure: RIGHT FOOT TIBIOCALCANEAL FUSION;  Surgeon: Harden Jerona GAILS, MD;  Location: [facility] OR;  Service: Orthopedics;  Laterality: Right;   ANTERIOR FUSION CERVICAL SPINE  2012   CARDIAC CATHETERIZATION  2011   CARDIAC CATHETERIZATION N/A 11/09/2016   Procedure: Right Heart Cath;  Surgeon: Victory LELON Sharps, MD;  Location: [facility] INVASIVE CV LAB;  Service: Cardiovascular;  Laterality: N/A;   carpel tunnel     left wrist   CATARACT EXTRACTION     CATARACT EXTRACTION W/ INTRAOCULAR LENS  IMPLANT, BILATERAL  2013   CERVICAL LAMINECTOMY  2012   COLONOSCOPY N/A 01/14/2013   Procedure: COLONOSCOPY;  Surgeon: Norleen LOISE Kiang, MD;  Location: [facility] ENDOSCOPY;  Service: Endoscopy;  Laterality: N/A;   CORONARY ARTERY BYPASS GRAFT  11/04/2020   LIMA-LAD, SVG-OM1, SVG-PDA (Dr Norleen [facility]) dc 11/18/2020   EYE SURGERY     I & D EXTREMITY Right 02/19/2021   Procedure: RIGHT ANKLE DEBRIDEMENT AND PLACEMENT ANTIBIOTIC BEADS;  Surgeon: Harden Jerona GAILS, MD;  Location: [facility] OR;  Service: Orthopedics;   Laterality: Right;   KNEE SURGERY  1998   left   LEFT HEART CATH AND CORONARY ANGIOGRAPHY N/A 07/10/2020   Procedure: LEFT HEART CATH AND CORONARY ANGIOGRAPHY;  Surgeon: Wonda Sharper, MD;  Location: [facility] INVASIVE CV LAB;  Service: Cardiovascular;  Laterality: N/A;   LUMBAR LAMINECTOMY  2003   LUNG BIOPSY Left 12/26/2016   Procedure: LUNG BIOPSY;  Surgeon: Elspeth JAYSON Millers, MD;  Location: [facility] OR;  Service: Thoracic;  Laterality: Left;   PACEMAKER IMPLANT N/A 03/30/2020   Procedure: PACEMAKER IMPLANT;  Surgeon: Waddell Danelle LELON, MD;  Location: [facility] INVASIVE CV LAB;  Service: Cardiovascular;  Laterality: N/A;   PERIPHERAL VASCULAR INTERVENTION Right 12/10/2020   Procedure: PERIPHERAL VASCULAR INTERVENTION;  Surgeon: Magda Debby LOISE, MD;  Location: [facility]VAS[facility] LAB;  Service: Cardiovascular;  Laterality: Right;  SFA   POSTERIOR FUSION CERVICAL SPINE  2012   PPM GENERATOR REMOVAL N/A 12/14/2020   Procedure: PPM GENERATOR REMOVAL;  Surgeon: Waddell Danelle LELON, MD;  Location: [facility] INVASIVE CV LAB;  Service: Cardiovascular;  Laterality: N/A;   TEE WITHOUT CARDIOVERSION N/A 12/11/2020   Procedure: TRANSESOPHAGEAL ECHOCARDIOGRAM (TEE);  Surgeon: Barbaraann Darryle Debby, MD;  Location: [facility] ENDOSCOPY;  Service: Cardiovascular;  Laterality: N/A;   TRIGGER FINGER RELEASE  2011   4th finger left hand   VIDEO ASSISTED THORACOSCOPY Left 12/26/2016   Procedure: VIDEO ASSISTED THORACOSCOPY;  Surgeon: Elspeth JAYSON Millers, MD;  Location: [facility] OR;  Service: Thoracic;  Laterality: Left;   VIDEO BRONCHOSCOPY N/A 12/26/2016   Procedure: VIDEO BRONCHOSCOPY;  Surgeon: Elspeth JAYSON Millers, MD;  Location: [facility] OR;  Service: Thoracic;  Laterality: N/A;     Social History:   reports that he has never smoked. He has never used smokeless tobacco. He reports that he does not drink alcohol and does not use drugs.   Family History:  His family history includes Diabetes Mellitus II in his mother; Emphysema (age of onset: 6) in his father;  Heart attack in his father. There is no history of Colon cancer, Esophageal cancer, Rectal cancer, or Stomach cancer.   Allergies Allergies  Allergen Reactions   Codeine Hives and Itching   Ofev  [Nintedanib ] Diarrhea    SEVERE DIARRHEA   Pirfenidone  Diarrhea and Other (  See Comments)    Esbriet  (Pirfenidone ) causes elevated LFTs. D/C on 06/14/17 and SEVERE DIARRHEA      Home Medications  Prior to Admission medications   Medication Sig Start Date End Date Taking? Authorizing Provider  albuterol  (PROVENTIL ) (2.5 MG/3ML) 0.083% nebulizer solution Take 2.5 mg by nebulization 2 (two) times daily as needed for wheezing or shortness of breath.    [provider]  albuterol  (VENTOLIN  HFA) 108 (90 Base) MCG/ACT inhaler Inhale 2 puffs into the lungs every 4 (four) hours as needed for wheezing or shortness of breath. Patient taking differently: Inhale 3 puffs into the lungs every 4 (four) hours as needed for wheezing or shortness of breath. 04/12/21   Angiulli, Toribio PARAS, PA-C  apixaban  (ELIQUIS ) 5 MG TABS tablet Take 1 tablet (5 mg total) by mouth 2 (two) times daily. 04/12/21   Angiulli, Toribio PARAS, PA-C  Ascorbic Acid  (VITAMIN C  WITH ROSE HIPS) 500 MG tablet Take 1 tablet (500 mg total) by mouth daily. 04/12/21   Angiulli, Toribio PARAS, PA-C  atorvastatin  (LIPITOR ) 80 MG tablet TAKE 1 TABLET BY MOUTH EVERY DAY Patient taking differently: Take 80 mg by mouth at bedtime. 07/25/22   Pietro Redell RAMAN, MD  clopidogrel  (PLAVIX ) 75 MG tablet Take 75 mg by mouth daily. 09/04/22   [provider]  diphenhydrAMINE  HCl, Sleep, (ZZZQUIL PO) Take 15 mLs by mouth at bedtime as needed (cough).    [provider]  docusate sodium  (COLACE) 100 MG capsule Take 1 capsule (100 mg total) by mouth daily. Patient taking differently: Take 100 mg by mouth as needed for mild constipation or moderate constipation. 04/13/21   Angiulli, Toribio PARAS, PA-C  escitalopram  (LEXAPRO ) 10 MG tablet Take 1 tablet (10 mg total)  by mouth daily. 04/12/21   Angiulli, Toribio PARAS, PA-C  FARXIGA  10 MG TABS tablet Take 10 mg by mouth daily.    [provider]  furosemide  (LASIX ) 20 MG tablet Take 3 tablets (60 mg total) by mouth daily. 06/24/23 07/24/23  Ezenduka, Nkeiruka J, MD  gabapentin  (NEURONTIN ) 300 MG capsule TAKE 1 CAPSULE BY MOUTH THREE TIMES A DAY Patient taking differently: Take 300 mg by mouth at bedtime. 12/14/21   Zamora, Erin R, NP  HUMALOG  KWIKPEN 100 UNIT/ML KwikPen Inject 10-15 Units into the skin 3 (three) times daily. Depending what his blood sugar reading is    [provider]  HYDROMET 5-1.5 MG/5ML syrup Take 5 mLs by mouth as needed for cough. 05/29/23   [provider]  LORazepam  (ATIVAN ) 1 MG tablet Take 1 tablet (1 mg total) by mouth at bedtime. Patient taking differently: Take 1 mg by mouth at bedtime as needed (sleep). 04/12/21   Angiulli, Toribio PARAS, PA-C  metoprolol  tartrate (LOPRESSOR ) 25 MG tablet Take 25 mg by mouth daily.    [provider]  Multiple Vitamin (MULTIVITAMIN WITH MINERALS) TABS tablet Take 1 tablet by mouth daily.    [provider]  mupirocin  ointment (BACTROBAN ) 2 % Apply 1 Application topically at bedtime. 06/06/23   [provider]  nitroGLYCERIN  (NITRODUR - DOSED IN MG/24 HR) 0.2 mg/hr patch Place 1 patch (0.2 mg total) onto the skin daily. 11/23/23   Harden Jerona GAILS, MD  Olopatadine HCl (PATADAY OP) Place 2 drops into both eyes daily as needed (allergies).    [provider]  ondansetron  (ZOFRAN ) 4 MG tablet Take 4 mg by mouth as needed for nausea or vomiting. 06/15/23   [provider]  pantoprazole  (PROTONIX ) 40  MG tablet Take 1 tablet (40 mg total) by mouth 2 (two) times daily. Patient taking differently: Take 40 mg by mouth daily. 04/12/21   Angiulli, Daniel J, PA-C  polyethylene glycol powder (GLYCOLAX /MIRALAX ) 17 GM/SCOOP powder Take 17 g by mouth every other day. 11/15/20   [provider]  traZODone   (DESYREL ) 50 MG tablet Take 75 mg by mouth at bedtime as needed for sleep. 05/11/23   [provider]  TRESIBA  FLEXTOUCH 100 UNIT/ML FlexTouch Pen Inject 20 Units into the skin daily. 12/22/22   [provider]  zinc  sulfate 220 (50 Zn) MG capsule Take 1 capsule (220 mg total) by mouth daily. 04/12/21   Pegge Toribio PARAS, PA-C     Critical care time: 40   Tinnie FORBES [name] DEVONNA [facility] 12/06/23 10:31 PM  Please see [email or web address] for pager details.  From 7A-7P if no response, please call [phone] After hours, please call ELink [phone]

## 2023-12-06 NOTE — Sepsis Progress Note (Signed)
 Sepsis protocol monitored by eLink ?

## 2023-12-06 NOTE — ED Notes (Signed)
 ED TO INPATIENT HANDOFF REPORT  ED Nurse Name and Phone #: [phone]  S Name/Age/Gender Shane [name] 83 y.o. male Room/Bed: 007C/007C  Code Status   Code Status: Full Code  Home/SNF/Other Home Patient oriented to: self, place, time, and situation Is this baseline? Yes   Triage Complete: Triage complete  Chief Complaint Acute kidney injury (nontraumatic) (HCC) [N17.9]  Triage Note Pt to the ed from home with a CC SOB starting at 1300 today. Pt was found at home in bed lying semi flat. Pt has low bp around 80/40 for ems. EMS started fluids and epi drip and placed pt on non rebreathe. Pt is A&Ox4 denies LOC. Pt relays he he started feeling bad today at 1300   Allergies Allergies  Allergen Reactions   Codeine Hives and Itching   Ofev  [Nintedanib ] Diarrhea    SEVERE DIARRHEA   Pirfenidone  Diarrhea and Other (See Comments)    Esbriet  (Pirfenidone ) causes elevated LFTs. D/C on 06/14/17 and SEVERE DIARRHEA     Level of Care/Admitting Diagnosis ED Disposition     ED Disposition  Admit   Condition  --   Comment  Hospital Area: [facility] [[id number]]  Level of Care: ICU [6]  May admit patient to [facility] or [facility] if equivalent level of care is available:: No  Covid Evaluation: Confirmed COVID Positive  Diagnosis: Acute kidney injury (nontraumatic) [facility]) [[id number]]  Admitting Physician: DUB MANCEL HERO [[id number]]  Attending Physician: DUB MANCEL HERO [[id number]]  Certification:: I certify this patient will need inpatient services for at least 2 midnights  Expected Medical Readiness: 12/13/2023          B Medical/Surgery History Past Medical History:  Diagnosis Date   Acute blood loss anemia    Anxiety    AV block, Mobitz 1    Cataract    Chronic kidney disease    d/t DM   CKD (chronic kidney disease), stage III (HCC)    COVID-19 virus infection 07/18/2020   Last Assessment & Plan:   Formatting of this note might be different from  the original.  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 10/2019 Echo: EF 55-60%, Gr2 DD. Nl RV.    Neuromuscular disorder (HCC)    Neuropathy associated with endocrine disorder (HCC)    Nonobstructive CAD (coronary artery disease)    a. 2012 Cath: mod, nonobs dzs; b. 10/2016 MV: EF 60%, no ischemia.   OSA on CPAP 05/05/2017   Unattended Home Sleep Test 7/2/813-AHI 38.6/hour, desaturation to 64%, body weight 261 pounds   PONV (postoperative nausea and vomiting)    Postoperative anemia due to acute blood loss 11/07/2020   Postoperative hemorrhagic shock 03/20/2021   Sleep apnea     uses cpap asked to bring mask and tubing   Past Surgical History:  Procedure Laterality Date   ABDOMINAL AORTOGRAM W/LOWER EXTREMITY N/A 12/10/2020   Procedure: ABDOMINAL AORTOGRAM W/LOWER EXTREMITY;  Surgeon: Magda,  Debby SAILOR, MD;  Location: [facility] INVASIVE CV LAB;  Service: Cardiovascular;  Laterality: N/A;   AMPUTATION Right 01/22/2021   Procedure: 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;  Surgeon: Harden Jerona GAILS, MD;  Location: [facility] OR;  Service: Orthopedics;  Laterality: Right;   AMPUTATION Right 03/17/2021   Procedure: RIGHT BELOW KNEE AMPUTATION;  Surgeon: Harden Jerona GAILS, MD;  Location: [facility] OR;  Service: Orthopedics;  Laterality: Right;   ANKLE FUSION Right 01/22/2021   Procedure: RIGHT FOOT TIBIOCALCANEAL FUSION;  Surgeon: Harden Jerona GAILS, MD;  Location: [facility] OR;  Service: Orthopedics;  Laterality: Right;   ANTERIOR FUSION CERVICAL SPINE  2012   CARDIAC CATHETERIZATION  2011    CARDIAC CATHETERIZATION N/A 11/09/2016   Procedure: Right Heart Cath;  Surgeon: Victory LELON Sharps, MD;  Location: [facility] INVASIVE CV LAB;  Service: Cardiovascular;  Laterality: N/A;   carpel tunnel     left wrist   CATARACT EXTRACTION     CATARACT EXTRACTION W/ INTRAOCULAR LENS  IMPLANT, BILATERAL  2013   CERVICAL LAMINECTOMY  2012   COLONOSCOPY N/A 01/14/2013   Procedure: COLONOSCOPY;  Surgeon: Norleen SAILOR Kiang, MD;  Location: [facility] ENDOSCOPY;  Service: Endoscopy;  Laterality: N/A;   CORONARY ARTERY BYPASS GRAFT  11/04/2020   LIMA-LAD, SVG-OM1, SVG-PDA (Dr Norleen Exon [facility]) dc 11/18/2020   EYE SURGERY     I & D EXTREMITY Right 02/19/2021   Procedure: RIGHT ANKLE DEBRIDEMENT AND PLACEMENT ANTIBIOTIC BEADS;  Surgeon: Harden Jerona GAILS, MD;  Location: [facility] OR;  Service: Orthopedics;  Laterality: Right;   KNEE SURGERY  1998   left   LEFT HEART CATH AND CORONARY ANGIOGRAPHY N/A 07/10/2020   Procedure: LEFT HEART CATH AND CORONARY ANGIOGRAPHY;  Surgeon: Wonda Sharper, MD;  Location: [facility] INVASIVE CV LAB;  Service: Cardiovascular;  Laterality: N/A;   LUMBAR LAMINECTOMY  2003   LUNG BIOPSY Left 12/26/2016   Procedure: LUNG BIOPSY;  Surgeon: Elspeth JAYSON Millers, MD;  Location: [facility] OR;  Service: Thoracic;  Laterality: Left;   PACEMAKER IMPLANT N/A 03/30/2020   Procedure: PACEMAKER IMPLANT;  Surgeon: Waddell Danelle LELON, MD;  Location: [facility] INVASIVE CV LAB;  Service: Cardiovascular;  Laterality: N/A;   PERIPHERAL VASCULAR INTERVENTION Right 12/10/2020   Procedure: PERIPHERAL VASCULAR INTERVENTION;  Surgeon: Magda Debby SAILOR, MD;  Location: [facility] INVASIVE CV LAB;  Service: Cardiovascular;  Laterality: Right;  SFA   POSTERIOR FUSION CERVICAL SPINE  2012   PPM GENERATOR REMOVAL N/A 12/14/2020   Procedure: PPM GENERATOR REMOVAL;  Surgeon: Waddell Danelle LELON, MD;  Location: [facility] INVASIVE CV LAB;  Service: Cardiovascular;  Laterality: N/A;   TEE WITHOUT CARDIOVERSION N/A 12/11/2020   Procedure: TRANSESOPHAGEAL ECHOCARDIOGRAM (TEE);  Surgeon: Barbaraann Darryle Debby, MD;  Location: [facility] ENDOSCOPY;  Service: Cardiovascular;  Laterality: N/A;   TRIGGER FINGER RELEASE  2011   4th finger left hand   VIDEO ASSISTED THORACOSCOPY Left 12/26/2016   Procedure: VIDEO ASSISTED THORACOSCOPY;  Surgeon: Elspeth JAYSON Millers, MD;  Location: [facility] OR;  Service: Thoracic;  Laterality: Left;   VIDEO BRONCHOSCOPY N/A 12/26/2016   Procedure: VIDEO BRONCHOSCOPY;  Surgeon: Elspeth JAYSON Millers, MD;  Location: [facility] OR;  Service: Thoracic;  Laterality: N/A;     A IV Location/Drains/Wounds Patient Lines/Drains/Airways Status     Active Line/Drains/Airways     Name Placement date Placement time Site Days   Peripheral IV 12/06/23 20 G Right Antecubital 12/06/23  1819  Antecubital  less than 1   Pressure Injury 03/26/21 Toe (Comment  which one) Anterior;Left Unstageable - Full  thickness tissue loss in which the base of the injury is covered by slough (yellow, tan, gray, green or brown) and/or eschar (tan, brown or black) in the wound bed. pressu 03/26/21  1423  -- 985   Wound / Incision (Open or Dehisced) 03/26/21 Other (Comment) Pretibial Proximal;Right Amputation incision  closed with staples 03/26/21  1427  Pretibial  985   Wound / Incision (Open or Dehisced) 06/21/23 Non-pressure wound Foot Anterior;Left 06/21/23  2000  Foot  168   Wound / Incision (Open or Dehisced) 06/21/23 Non-pressure wound Anterior;Left 06/21/23  2000  --  168            Intake/Output Last 24 hours  Intake/Output Summary (Last 24 hours) at 12/06/2023 2303 Last data filed at 12/06/2023 2220 Gross per 24 hour  Intake 383.25 ml  Output --  Net 383.25 ml    Labs/Imaging Results for orders placed or performed during the hospital encounter of 12/06/23 (from the past 48 hours)  Resp panel by RT-PCR (RSV, Flu A&B, Covid) Anterior Nasal Swab     Status: Abnormal   Collection Time: 12/06/23  6:12 PM   Specimen: Anterior Nasal Swab  Result Value Ref Range   SARS Coronavirus 2 by RT PCR POSITIVE  (A) NEGATIVE   Influenza A by PCR NEGATIVE NEGATIVE   Influenza B by PCR NEGATIVE NEGATIVE    Comment: (NOTE) The Xpert Xpress SARS-CoV-2/FLU/RSV plus assay is intended as an aid in the diagnosis of influenza from Nasopharyngeal swab specimens and should not be used as a sole basis for treatment. Nasal washings and aspirates are unacceptable for Xpert Xpress SARS-CoV-2/FLU/RSV testing.  Fact Sheet for Patients: bloggercourse.com  Fact Sheet for Healthcare Providers: seriousbroker.it  This test is not yet approved or cleared by the [place]  FDA and has been authorized for detection and/or diagnosis of SARS-CoV-2 by FDA under an Emergency Use Authorization (EUA). This EUA will remain in effect (meaning this test can be used) for the duration of the COVID-19 declaration under Section 564(b)(1) of the Act, 21 U.S.C. section 360bbb-3(b)(1), unless the authorization is terminated or revoked.     Resp Syncytial Virus by PCR NEGATIVE NEGATIVE    Comment: (NOTE) Fact Sheet for Patients: bloggercourse.com  Fact Sheet for Healthcare Providers: seriousbroker.it  This test is not yet approved or cleared by the [place]  FDA and has been authorized for detection and/or diagnosis of SARS-CoV-2 by FDA under an Emergency Use Authorization (EUA). This EUA will remain in effect (meaning this test can be used) for the duration of the COVID-19 declaration under Section 564(b)(1) of the Act, 21 U.S.C. section 360bbb-3(b)(1), unless the authorization is terminated or revoked.  Performed at [facility], [address], [place], [place] [address]   Comprehensive metabolic panel     Status: Abnormal   Collection Time: 12/06/23  6:40 PM  Result Value Ref Range   Sodium 130 (L) 135 - 145 mmol/L   Potassium 7.1 (HH) 3.5 - 5.1 mmol/L    Comment: CRITICAL RESULT CALLED TO, READ BACK BY  AND VERIFIED WITH JENNIFER KLOUSTER RN @1950  ON 12/06/23 BY MAB CALLED TWICE   Chloride 100 98 - 111 mmol/L   CO2 16 (L) 22 - 32 mmol/L   Glucose, Bld 165 (H) 70 - 99 mg/dL    Comment: Glucose reference range applies only to samples taken after fasting for at least 8 hours.   BUN 116 (H) 8 - 23 mg/dL   Creatinine, Ser 5.30 (H) 0.61 -  1.24 mg/dL   Calcium  7.9 (L) 8.9 - 10.3 mg/dL   Total Protein 6.8 6.5 - 8.1 g/dL   Albumin  2.9 (L) 3.5 - 5.0 g/dL   AST 45 (H) 15 - 41 U/L   ALT 16 0 - 44 U/L   Alkaline Phosphatase 85 38 - 126 U/L   Total Bilirubin 0.6 0.0 - 1.2 mg/dL   GFR, Estimated 12 (L) >60 mL/min    Comment: (NOTE) Calculated using the CKD-EPI Creatinine Equation (2021)    Anion gap 14 5 - 15    Comment: Performed at [facility], [address], [place], [place] [address]  CBC with Differential     Status: Abnormal   Collection Time: 12/06/23  6:40 PM  Result Value Ref Range   WBC 8.8 4.0 - 10.5 K/uL   RBC 3.49 (L) 4.22 - 5.81 MIL/uL   Hemoglobin 9.5 (L) 13.0 - 17.0 g/dL   HCT 69.8 (L) 60.9 - 47.9 %   MCV 86.2 80.0 - 100.0 fL   MCH 27.2 26.0 - 34.0 pg   MCHC 31.6 30.0 - 36.0 g/dL   RDW 80.8 (H) 88.4 - 84.4 %   Platelets 192 150 - 400 K/uL   nRBC 0.0 0.0 - 0.2 %   Neutrophils Relative % 75 %   Neutro Abs 6.7 1.7 - 7.7 K/uL   Lymphocytes Relative 5 %   Lymphs Abs 0.5 (L) 0.7 - 4.0 K/uL   Monocytes Relative 18 %   Monocytes Absolute 1.6 (H) 0.1 - 1.0 K/uL   Eosinophils Relative 1 %   Eosinophils Absolute 0.0 0.0 - 0.5 K/uL   Basophils Relative 0 %   Basophils Absolute 0.0 0.0 - 0.1 K/uL   Immature Granulocytes 1 %   Abs Immature Granulocytes 0.07 0.00 - 0.07 K/uL    Comment: Performed at [facility], [address] [place], [place] [address]  Protime-INR     Status: Abnormal   Collection Time: 12/06/23  6:40 PM  Result Value Ref Range   Prothrombin Time 18.8 (H) 11.4 - 15.2 seconds   INR 1.6 (H) 0.8 - 1.2    Comment: (NOTE) INR goal varies based on  device and disease states. Performed at [facility], [address], [place], [place] [address]   APTT     Status: Abnormal   Collection Time: 12/06/23  6:40 PM  Result Value Ref Range   aPTT 37 (H) 24 - 36 seconds    Comment:        IF BASELINE aPTT IS ELEVATED, SUGGEST PATIENT RISK ASSESSMENT BE USED TO DETERMINE APPROPRIATE ANTICOAGULANT THERAPY. Performed at [facility], [address] [place], [place] [address]   I-Stat Lactic Acid, ED     Status: Abnormal   Collection Time: 12/06/23  6:52 PM  Result Value Ref Range   Lactic Acid, Venous 2.3 (HH) 0.5 - 1.9 mmol/L   Comment NOTIFIED PHYSICIAN   CBG monitoring, ED     Status: Abnormal   Collection Time: 12/06/23  8:18 PM  Result Value Ref Range   Glucose-Capillary 144 (H) 70 - 99 mg/dL    Comment: Glucose reference range applies only to samples taken after fasting for at least 8 hours.  Basic metabolic panel     Status: Abnormal   Collection Time: 12/06/23  8:20 PM  Result Value Ref Range   Sodium 129 (L) 135 - 145 mmol/L   Potassium >7.5 (HH) 3.5 - 5.1 mmol/L  Comment: CRITICAL RESULT CALLED TO, READ BACK BY AND VERIFIED WITH RONAL DAWN, RN @2147  12/06/23 SATRAINR REPEATED TO VERIFY    Chloride 96 (L) 98 - 111 mmol/L   CO2 18 (L) 22 - 32 mmol/L   Glucose, Bld 165 (H) 70 - 99 mg/dL    Comment: Glucose reference range applies only to samples taken after fasting for at least 8 hours.   BUN 118 (H) 8 - 23 mg/dL   Creatinine, Ser 5.20 (H) 0.61 - 1.24 mg/dL   Calcium  8.3 (L) 8.9 - 10.3 mg/dL   GFR, Estimated 11 (L) >60 mL/min    Comment: (NOTE) Calculated using the CKD-EPI Creatinine Equation (2021)    Anion gap 15 5 - 15    Comment: Performed at [facility], [address], [place], [place] [address]  I-Stat Lactic Acid, ED     Status: None   Collection Time: 12/06/23  8:26 PM  Result Value Ref Range   Lactic Acid, Venous 1.9 0.5 - 1.9 mmol/L  Troponin I (High Sensitivity)     Status: Abnormal    Collection Time: 12/06/23  9:28 PM  Result Value Ref Range   Troponin I (High Sensitivity) 8,290 (HH) <18 ng/L    Comment: CRITICAL RESULT CALLED TO, READ BACK BY AND VERIFIED WITH RONAL DAWN,. RN @2217  12/06/23 SATRAINR (NOTE) Elevated high sensitivity troponin I (hsTnI) values and significant  changes across serial measurements may suggest ACS but many other  chronic and acute conditions are known to elevate hsTnI results.  Refer to the Links section for chest pain algorithms and additional  guidance. Performed at [facility], [address] [place], [place] [address]   CBG monitoring, ED     Status: Abnormal   Collection Time: 12/06/23  9:40 PM  Result Value Ref Range   Glucose-Capillary 215 (H) 70 - 99 mg/dL    Comment: Glucose reference range applies only to samples taken after fasting for at least 8 hours.  Urinalysis, Routine w reflex microscopic -Urine, Clean Catch     Status: Abnormal   Collection Time: 12/06/23  9:41 PM  Result Value Ref Range   Color, Urine YELLOW YELLOW   APPearance HAZY (A) CLEAR   Specific Gravity, Urine 1.017 1.005 - 1.030   pH 5.0 5.0 - 8.0   Glucose, UA 50 (A) NEGATIVE mg/dL   Hgb urine dipstick NEGATIVE NEGATIVE   Bilirubin Urine NEGATIVE NEGATIVE   Ketones, ur NEGATIVE NEGATIVE mg/dL   Protein, ur NEGATIVE NEGATIVE mg/dL   Nitrite NEGATIVE NEGATIVE   Leukocytes,Ua NEGATIVE NEGATIVE    Comment: Performed at [facility], [address], [place], [place] [address]  Creatinine, urine, random     Status: None   Collection Time: 12/06/23  9:41 PM  Result Value Ref Range   Creatinine, Urine 176 mg/dL    Comment: Performed at [facility], [address], [place], [place] [address]   *Note: Due to a large number of results and/or encounters for the requested time period, some results have not been displayed. A complete set of results can be found in Results Review.   US  RENAL Result Date: 12/06/2023 CLINICAL DATA:  Acute renal  insufficiency EXAM: RENAL / URINARY TRACT ULTRASOUND COMPLETE COMPARISON:  04/27/2018 FINDINGS: Right Kidney: Renal measurements: 11.8 x 5.2 x 4.3 cm = volume: 137.2 mL. Echogenicity within normal limits. No mass or hydronephrosis visualized. Left Kidney: Renal measurements: 11.7 x 5.6 x 4.5 cm = volume:  154.4 mL. Echogenicity within normal limits. No mass or hydronephrosis visualized. Bladder: Appears normal for degree of bladder distention. Other: None. IMPRESSION: 1. Unremarkable renal ultrasound. Electronically Signed   By: Ozell Daring M.D.   On: 12/06/2023 22:10   DG Chest Port 1 View Result Date: 12/06/2023 CLINICAL DATA:  SOB. EXAM: PORTABLE CHEST - 1 VIEW COMPARISON:  06/21/2023. FINDINGS: Cardiac silhouette is prominent. There is pulmonary interstitial prominence with vascular congestion. No focal consolidation. No pneumothorax. There are bilateral pleural effusions. 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 IMPRESSION: Findings suggest CHF. Electronically Signed   By: Fonda Field M.D.   On: 12/06/2023 19:13    Pending Labs Unresulted Labs (From admission, onward)     Start     Ordered   12/13/23 0500  Creatinine, serum  (enoxaparin  (LOVENOX )    CrCl < 30 ml/min)  Once,   R       Comments: while on enoxaparin  therapy.    12/06/23 2215   12/07/23 0500  CBC  Tomorrow morning,   R        12/06/23 2215   12/07/23 0500  Basic metabolic panel  Tomorrow morning,   R        12/06/23 2215   12/07/23 0500  Magnesium   Tomorrow morning,   R        12/06/23 2215   12/07/23 0500  Phosphorus  Tomorrow morning,   R        12/06/23 2215   12/06/23 2258  Hemoglobin A1c  Once,   R        12/06/23 2257   12/06/23 2258  MRSA Next Gen by PCR, Nasal  (MRSA Screening)  Once,   R        12/06/23 2257   12/06/23 2221  Potassium  ONCE - STAT,   STAT        12/06/23 2221   12/06/23 2214  Magnesium   Once,   R        12/06/23 2215   12/06/23 2214  Phosphorus  Once,    R        12/06/23 2215   12/06/23 2214  Procalcitonin  Once,   R       References:    Procalcitonin Lower Respiratory Tract Infection AND Sepsis Procalcitonin Algorithm   12/06/23 2215   12/06/23 2214  Brain natriuretic peptide  Once,   R        12/06/23 2215   12/06/23 2212  CBC  (enoxaparin  (LOVENOX )    CrCl < 30 ml/min)  Once,   R       Comments: Baseline for enoxaparin  therapy IF NOT ALREADY DRAWN.  Notify MD if PLT < 100 K.    12/06/23 2215   12/06/23 2212  Creatinine, serum  (enoxaparin  (LOVENOX )    CrCl < 30 ml/min)  Once,   R       Comments: Baseline for enoxaparin  therapy IF NOT ALREADY DRAWN.    12/06/23 2215   12/06/23 2128  Sodium, urine, random  Once,   URGENT        12/06/23 2127   12/06/23 1812  Blood Culture (routine x 2)  (Septic presentation on arrival (screening labs, nursing and treatment orders for obvious sepsis))  BLOOD CULTURE X 2,   STAT      12/06/23 1812            Vitals/Pain Today's Vitals   12/06/23 2100 12/06/23 2200  12/06/23 2235 12/06/23 2240  BP: (!) 91/40 106/84 (!) 121/97 (!) 93/40  Pulse: 60 72 79 84  Resp: 20 19 18 20   Temp:      TempSrc:      SpO2: 100% 100% 100% 100%  Weight:      Height:      PainSc:        Isolation Precautions Airborne and Contact precautions  Medications Medications  ondansetron  (ZOFRAN ) 4 MG/2ML injection (  Not Given 12/06/23 2120)  docusate sodium  (COLACE) capsule 100 mg (has no administration in time range)  polyethylene glycol (MIRALAX  / GLYCOLAX ) packet 17 g (has no administration in time range)  acetaminophen  (TYLENOL ) tablet 650 mg (has no administration in time range)  insulin  aspart (novoLOG ) injection 0-9 Units (has no administration in time range)  ondansetron  (ZOFRAN ) injection 4 mg (has no administration in time range)  remdesivir  200 mg in sodium chloride  0.9% 250 mL IVPB (has no administration in time range)    Followed by  remdesivir  100 mg in sodium chloride  0.9 % 100 mL IVPB (has no  administration in time range)  dexamethasone  (DECADRON ) injection 6 mg (has no administration in time range)  clopidogrel  (PLAVIX ) tablet 75 mg (has no administration in time range)  escitalopram  (LEXAPRO ) tablet 10 mg (has no administration in time range)  pantoprazole  (PROTONIX ) EC tablet 40 mg (has no administration in time range)  ipratropium-albuterol  (DUONEB) 0.5-2.5 (3) MG/3ML nebulizer solution 3 mL (has no administration in time range)  apixaban  (ELIQUIS ) tablet 2.5 mg (has no administration in time range)  cefTRIAXone  (ROCEPHIN ) 2 g in sodium chloride  0.9 % 100 mL IVPB (0 g Intravenous Stopped 12/06/23 1908)  azithromycin  (ZITHROMAX ) 500 mg in sodium chloride  0.9 % 250 mL IVPB (0 mg Intravenous Stopped 12/06/23 2153)  ipratropium-albuterol  (DUONEB) 0.5-2.5 (3) MG/3ML nebulizer solution 3 mL (3 mLs Nebulization Given 12/06/23 1826)  ipratropium-albuterol  (DUONEB) 0.5-2.5 (3) MG/3ML nebulizer solution 3 mL (3 mLs Nebulization Given 12/06/23 1826)  ipratropium-albuterol  (DUONEB) 0.5-2.5 (3) MG/3ML nebulizer solution 3 mL (3 mLs Nebulization Given 12/06/23 1826)  sodium zirconium cyclosilicate  (LOKELMA ) packet 10 g (10 g Oral Given 12/06/23 2230)  calcium  gluconate inj 10% (1 g) URGENT USE ONLY! (1 g Intravenous Given 12/06/23 2100)  albuterol  (PROVENTIL ) (2.5 MG/3ML) 0.083% nebulizer solution 10 mg (10 mg Nebulization Given by Other 12/06/23 2059)  insulin  aspart (novoLOG ) injection 5 Units (5 Units Intravenous Given 12/06/23 2057)    And  dextrose  50 % solution 50 mL (50 mLs Intravenous Given 12/06/23 2101)  sodium bicarbonate  injection 50 mEq (50 mEq Intravenous Given 12/06/23 2102)  furosemide  (LASIX ) injection 80 mg (80 mg Intravenous Given 12/06/23 2226)  albumin  human 25 % solution 25 g (0 g Intravenous Stopped 12/06/23 2220)    Mobility walks     Focused Assessments Renal Assessment Handoff:  Hemodialysis Schedule: not a dialysis pt yet Last Hemodialysis date and time: never   Restricted  appendage:    R Recommendations: See Admitting Provider Note  Report given to:   Additional Notes: sending more labs now

## 2023-12-06 NOTE — Progress Notes (Addendum)
 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  He is found to be COVID-positive with acute respiratory failure and hypoxemia complicated by an acute kidney injury.  In the emergency department, he was treated with community-acquired pneumonia coverage, started on remdesivir , dexamethasone , and admitted to the ICU for further management.  Vital signs show mild hypotension but otherwise normal vitals.  Results consistent with hyperkalemia, hyponatremia, and non-anion gap metabolic acidosis.  Normocytic anemia.  Troponin is elevated to greater than 8000.  Paced rhythm on ECG.  Unremarkable renal ultrasound.  Chest radiograph is hypervascular with bilateral effusions.  eICU Interventions  Initiate norepinephrine  infusion as needed to maintain MAP greater than 65.  Agree with diuresis as tolerated.  Nephrology contacted about renal replacement therapy-holding decision on RRT until the morning.   In the setting of multiorgan dysfunction and advanced age, patient has poor prognosis.  Consider goals of care conversation and palliative care consultation.  He reaffirms full CODE STATUS for now.  DVT prophylaxis with home apixaban  GI prophylaxis with home pantoprazole    0042 -given wide pulse pressures, will titrate to a MAP greater than 55 or systolic greater than 100.  Manage BiPAP ordered to home CPAP order with auto titration if applicable.  Maintain home regimen.  l  Intervention Category Evaluation Type: New Patient Evaluation  Lilyian Quayle 12/06/2023, 11:50 PM

## 2023-12-07 ENCOUNTER — Other Ambulatory Visit: Payer: Self-pay

## 2023-12-07 ENCOUNTER — Inpatient Hospital Stay (HOSPITAL_COMMUNITY): Payer: Medicare Other

## 2023-12-07 ENCOUNTER — Other Ambulatory Visit (HOSPITAL_COMMUNITY): Payer: Medicare Other

## 2023-12-07 ENCOUNTER — Encounter (HOSPITAL_COMMUNITY): Payer: Self-pay | Admitting: Pulmonary Disease

## 2023-12-07 DIAGNOSIS — I214 Non-ST elevation (NSTEMI) myocardial infarction: Secondary | ICD-10-CM

## 2023-12-07 DIAGNOSIS — N179 Acute kidney failure, unspecified: Secondary | ICD-10-CM | POA: Diagnosis not present

## 2023-12-07 DIAGNOSIS — I509 Heart failure, unspecified: Secondary | ICD-10-CM

## 2023-12-07 DIAGNOSIS — E875 Hyperkalemia: Principal | ICD-10-CM

## 2023-12-07 DIAGNOSIS — U071 COVID-19: Secondary | ICD-10-CM

## 2023-12-07 DIAGNOSIS — R7989 Other specified abnormal findings of blood chemistry: Secondary | ICD-10-CM

## 2023-12-07 LAB — BASIC METABOLIC PANEL
Anion gap: 19 — ABNORMAL HIGH (ref 5–15)
BUN: 122 mg/dL — ABNORMAL HIGH (ref 8–23)
CO2: 15 mmol/L — ABNORMAL LOW (ref 22–32)
Calcium: 8.2 mg/dL — ABNORMAL LOW (ref 8.9–10.3)
Chloride: 94 mmol/L — ABNORMAL LOW (ref 98–111)
Creatinine, Ser: 5.29 mg/dL — ABNORMAL HIGH (ref 0.61–1.24)
GFR, Estimated: 10 mL/min — ABNORMAL LOW (ref >60)
Glucose, Bld: 282 mg/dL — ABNORMAL HIGH (ref 70–99)
Potassium: 6.3 mmol/L (ref 3.5–5.1)
Sodium: 128 mmol/L — ABNORMAL LOW (ref 135–145)

## 2023-12-07 LAB — CBC
HCT: 28.6 % — ABNORMAL LOW (ref 39.0–52.0)
HCT: 27.7 % — ABNORMAL LOW (ref 39.0–52.0)
Hemoglobin: 9.1 g/dL — ABNORMAL LOW (ref 13.0–17.0)
Hemoglobin: 9.2 g/dL — ABNORMAL LOW (ref 13.0–17.0)
MCH: 26.6 pg (ref 26.0–34.0)
MCH: 27.1 pg (ref 26.0–34.0)
MCHC: 31.8 g/dL (ref 30.0–36.0)
MCHC: 33.2 g/dL (ref 30.0–36.0)
MCV: 83.6 fL (ref 80.0–100.0)
MCV: 81.7 fL (ref 80.0–100.0)
Platelets: 175 10*3/uL (ref 150–400)
Platelets: 168 10*3/uL (ref 150–400)
RBC: 3.42 MIL/uL — ABNORMAL LOW (ref 4.22–5.81)
RBC: 3.39 MIL/uL — ABNORMAL LOW (ref 4.22–5.81)
RDW: 18.9 % — ABNORMAL HIGH (ref 11.5–15.5)
RDW: 18.9 % — ABNORMAL HIGH (ref 11.5–15.5)
WBC: 7.7 10*3/uL (ref 4.0–10.5)
WBC: 13.2 10*3/uL — ABNORMAL HIGH (ref 4.0–10.5)
nRBC: 0 % (ref 0.0–0.2)
nRBC: 0 % (ref 0.0–0.2)

## 2023-12-07 LAB — LIPID PANEL
Cholesterol: 77 mg/dL (ref 0–200)
HDL: 26 mg/dL — ABNORMAL LOW (ref >40)
LDL Cholesterol: 26 mg/dL (ref 0–99)
Total CHOL/HDL Ratio: 3 {ratio}
Triglycerides: 126 mg/dL (ref <150)
VLDL: 25 mg/dL (ref 0–40)

## 2023-12-07 LAB — IRON AND TIBC
Iron: 20 ug/dL — ABNORMAL LOW (ref 45–182)
Saturation Ratios: 6 % — ABNORMAL LOW (ref 17.9–39.5)
TIBC: 312 ug/dL (ref 250–450)
UIBC: 292 ug/dL

## 2023-12-07 LAB — RENAL FUNCTION PANEL
Albumin: 2.9 g/dL — ABNORMAL LOW (ref 3.5–5.0)
Albumin: 2.7 g/dL — ABNORMAL LOW (ref 3.5–5.0)
Anion gap: 17 — ABNORMAL HIGH (ref 5–15)
Anion gap: 16 — ABNORMAL HIGH (ref 5–15)
BUN: 123 mg/dL — ABNORMAL HIGH (ref 8–23)
BUN: 75 mg/dL — ABNORMAL HIGH (ref 8–23)
CO2: 17 mmol/L — ABNORMAL LOW (ref 22–32)
CO2: 22 mmol/L (ref 22–32)
Calcium: 8.2 mg/dL — ABNORMAL LOW (ref 8.9–10.3)
Calcium: 8.2 mg/dL — ABNORMAL LOW (ref 8.9–10.3)
Chloride: 96 mmol/L — ABNORMAL LOW (ref 98–111)
Chloride: 94 mmol/L — ABNORMAL LOW (ref 98–111)
Creatinine, Ser: 5.23 mg/dL — ABNORMAL HIGH (ref 0.61–1.24)
Creatinine, Ser: 2.72 mg/dL — ABNORMAL HIGH (ref 0.61–1.24)
GFR, Estimated: 10 mL/min — ABNORMAL LOW (ref >60)
GFR, Estimated: 23 mL/min — ABNORMAL LOW (ref >60)
Glucose, Bld: 321 mg/dL — ABNORMAL HIGH (ref 70–99)
Glucose, Bld: 163 mg/dL — ABNORMAL HIGH (ref 70–99)
Phosphorus: 6.8 mg/dL — ABNORMAL HIGH (ref 2.5–4.6)
Phosphorus: 4.2 mg/dL (ref 2.5–4.6)
Potassium: 6.7 mmol/L (ref 3.5–5.1)
Potassium: 4.9 mmol/L (ref 3.5–5.1)
Sodium: 130 mmol/L — ABNORMAL LOW (ref 135–145)
Sodium: 132 mmol/L — ABNORMAL LOW (ref 135–145)

## 2023-12-07 LAB — ECHOCARDIOGRAM COMPLETE
AR max vel: 1.59 cm2
AV Area VTI: 1.57 cm2
AV Area mean vel: 1.51 cm2
AV Mean grad: 8 mm[Hg]
AV Peak grad: 13.8 mm[Hg]
Ao pk vel: 1.86 m/s
Est EF: 40
Height: 72 in
S' Lateral: 4 cm
Weight: 3858.93 [oz_av]

## 2023-12-07 LAB — MRSA NEXT GEN BY PCR, NASAL: MRSA by PCR Next Gen: NOT DETECTED

## 2023-12-07 LAB — TROPONIN I (HIGH SENSITIVITY): Troponin I (High Sensitivity): 7625 ng/L (ref <18)

## 2023-12-07 LAB — GLUCOSE, CAPILLARY
Glucose-Capillary: 121 mg/dL — ABNORMAL HIGH (ref 70–99)
Glucose-Capillary: 122 mg/dL — ABNORMAL HIGH (ref 70–99)
Glucose-Capillary: 160 mg/dL — ABNORMAL HIGH (ref 70–99)
Glucose-Capillary: 207 mg/dL — ABNORMAL HIGH (ref 70–99)
Glucose-Capillary: 223 mg/dL — ABNORMAL HIGH (ref 70–99)
Glucose-Capillary: 247 mg/dL — ABNORMAL HIGH (ref 70–99)
Glucose-Capillary: 269 mg/dL — ABNORMAL HIGH (ref 70–99)
Glucose-Capillary: 269 mg/dL — ABNORMAL HIGH (ref 70–99)

## 2023-12-07 LAB — APTT
aPTT: 153 s — ABNORMAL HIGH (ref 24–36)
aPTT: >200 s (ref 24–36)
aPTT: >200 s (ref 24–36)
aPTT: 133 s — ABNORMAL HIGH (ref 24–36)

## 2023-12-07 LAB — TSH: TSH: 2.576 u[IU]/mL (ref 0.350–4.500)

## 2023-12-07 LAB — PHOSPHORUS: Phosphorus: 7.3 mg/dL — ABNORMAL HIGH (ref 2.5–4.6)

## 2023-12-07 LAB — FERRITIN: Ferritin: 30 ng/mL (ref 24–336)

## 2023-12-07 LAB — HEMOGLOBIN A1C
Hgb A1c MFr Bld: 8.5 % — ABNORMAL HIGH (ref 4.8–5.6)
Mean Plasma Glucose: 197 mg/dL

## 2023-12-07 LAB — FOLATE: Folate: 21.7 ng/mL (ref >5.9)

## 2023-12-07 LAB — MAGNESIUM
Magnesium: 1.8 mg/dL (ref 1.7–2.4)
Magnesium: 1.9 mg/dL (ref 1.7–2.4)

## 2023-12-07 LAB — HEPARIN LEVEL (UNFRACTIONATED): Heparin Unfractionated: >1.1 [IU]/mL — ABNORMAL HIGH (ref 0.30–0.70)

## 2023-12-07 LAB — T4, FREE: Free T4: 0.73 ng/dL (ref 0.61–1.12)

## 2023-12-07 LAB — PROCALCITONIN: Procalcitonin: 0.22 ng/mL

## 2023-12-07 LAB — POTASSIUM: Potassium: 5.7 mmol/L — ABNORMAL HIGH (ref 3.5–5.1)

## 2023-12-07 LAB — BRAIN NATRIURETIC PEPTIDE: B Natriuretic Peptide: 480.6 pg/mL — ABNORMAL HIGH (ref 0.0–100.0)

## 2023-12-07 LAB — VITAMIN B12: Vitamin B-12: 300 pg/mL (ref 180–914)

## 2023-12-07 MED ORDER — PRISMASOL BGK 0/2.5 32-2.5 MEQ/L EC SOLN
Status: DC
  Filled 2023-12-07 (×11): qty 5000

## 2023-12-07 MED ORDER — HEPARIN (PORCINE) 25000 UT/250ML-% IV SOLN: 10.0000 [IU]/kg/h | INTRAVENOUS | Status: DC

## 2023-12-07 MED ORDER — HEPARIN (PORCINE) 25000 UT/250ML-% IV SOLN
1500.0000 [IU]/h | INTRAVENOUS | Status: DC
  Administered 2023-12-07: 1500 [IU]/h via INTRAVENOUS
  Filled 2023-12-07: qty 250

## 2023-12-07 MED ORDER — NOREPINEPHRINE 16 MG/250ML-% IV SOLN: 0.0000 ug/min | INTRAVENOUS | Status: DC

## 2023-12-07 MED ORDER — MELATONIN 5 MG PO TABS
5.0000 mg | ORAL_TABLET | Freq: Every evening | ORAL | Status: DC | PRN
  Administered 2023-12-08 – 2023-12-19 (×6): 5 mg via ORAL
  Filled 2023-12-07 (×6): qty 1

## 2023-12-07 MED ORDER — INSULIN ASPART 100 UNIT/ML IV SOLN
5.0000 [IU] | Freq: Once | INTRAVENOUS | Status: AC
  Administered 2023-12-07: 5 [IU] via INTRAVENOUS

## 2023-12-07 MED ORDER — HEPARIN SODIUM (PORCINE) 1000 UNIT/ML IJ SOLN
1000.0000 [IU] | INTRAMUSCULAR | Status: DC | PRN
  Filled 2023-12-07 (×4): qty 1

## 2023-12-07 MED ORDER — ENSURE ENLIVE PO LIQD
237.0000 mL | Freq: Two times a day (BID) | ORAL | Status: DC
  Administered 2023-12-07: 237 mL via ORAL

## 2023-12-07 MED ORDER — IPRATROPIUM-ALBUTEROL 0.5-2.5 (3) MG/3ML IN SOLN
3.0000 mL | RESPIRATORY_TRACT | Status: DC | PRN
  Administered 2023-12-11 – 2023-12-23 (×6): 3 mL via RESPIRATORY_TRACT
  Filled 2023-12-07 (×6): qty 3

## 2023-12-07 MED ORDER — DOCUSATE SODIUM 100 MG PO CAPS
100.0000 mg | ORAL_CAPSULE | Freq: Two times a day (BID) | ORAL | Status: DC
  Administered 2023-12-07 – 2023-12-29 (×39): 100 mg via ORAL
  Filled 2023-12-07 (×41): qty 1

## 2023-12-07 MED ORDER — DEXTROSE 50 % IV SOLN
1.0000 | Freq: Once | INTRAVENOUS | Status: AC
  Administered 2023-12-07: 50 mL via INTRAVENOUS
  Filled 2023-12-07: qty 50

## 2023-12-07 MED ORDER — INSULIN GLARGINE-YFGN 100 UNIT/ML ~~LOC~~ SOLN
10.0000 [IU] | Freq: Every day | SUBCUTANEOUS | Status: DC
  Administered 2023-12-07 – 2023-12-12 (×6): 10 [IU] via SUBCUTANEOUS
  Filled 2023-12-07 (×7): qty 0.1

## 2023-12-07 MED ORDER — PRISMASOL BGK 4/2.5 32-4-2.5 MEQ/L REPLACEMENT SOLN
Status: DC
  Filled 2023-12-07 (×2): qty 5000

## 2023-12-07 MED ORDER — ATORVASTATIN CALCIUM 80 MG PO TABS
80.0000 mg | ORAL_TABLET | Freq: Every day | ORAL | Status: DC
Start: 2023-12-07 — End: 2023-12-30
  Administered 2023-12-07 – 2023-12-30 (×24): 80 mg via ORAL
  Filled 2023-12-07 (×19): qty 1
  Filled 2023-12-07: qty 2
  Filled 2023-12-07 (×4): qty 1

## 2023-12-07 MED ORDER — HEPARIN (PORCINE) 25000 UT/250ML-% IV SOLN: 1250.0000 [IU]/h | INTRAVENOUS | Status: DC

## 2023-12-07 MED ORDER — HEPARIN SODIUM (PORCINE) 5000 UNIT/ML IJ SOLN: 500.0000 [IU]/h | INTRAMUSCULAR | Status: DC

## 2023-12-07 MED ORDER — HEPARIN SODIUM (PORCINE) 1000 UNIT/ML DIALYSIS: 1000.0000 [IU] | INTRAMUSCULAR | Status: DC | PRN

## 2023-12-07 MED ORDER — HEPARIN (PORCINE) 25000 UT/250ML-% IV SOLN
1200.0000 [IU]/h | INTRAVENOUS | Status: DC
Start: 2023-12-07 — End: 2023-12-07
  Administered 2023-12-07: 1200 [IU]/h via INTRAVENOUS
  Filled 2023-12-07: qty 250

## 2023-12-07 MED ORDER — GUAIFENESIN 100 MG/5ML PO LIQD
5.0000 mL | ORAL | Status: DC | PRN
  Administered 2023-12-07 – 2023-12-12 (×7): 5 mL via ORAL
  Filled 2023-12-07 (×8): qty 10

## 2023-12-07 MED ORDER — INSULIN ASPART 100 UNIT/ML IJ SOLN
0.0000 [IU] | Freq: Every day | INTRAMUSCULAR | Status: DC
Start: 2023-12-07 — End: 2023-12-27
  Administered 2023-12-11: 2 [IU] via SUBCUTANEOUS
  Administered 2023-12-12 – 2023-12-13 (×2): 4 [IU] via SUBCUTANEOUS
  Administered 2023-12-14: 2 [IU] via SUBCUTANEOUS
  Administered 2023-12-15: 3 [IU] via SUBCUTANEOUS
  Administered 2023-12-16 – 2023-12-18 (×3): 2 [IU] via SUBCUTANEOUS
  Administered 2023-12-19 – 2023-12-20 (×2): 3 [IU] via SUBCUTANEOUS
  Administered 2023-12-22: 2 [IU] via SUBCUTANEOUS
  Administered 2023-12-23: 4 [IU] via SUBCUTANEOUS
  Administered 2023-12-26: 2 [IU] via SUBCUTANEOUS

## 2023-12-07 MED ORDER — HEPARIN SODIUM (PORCINE) 1000 UNIT/ML DIALYSIS
1000.0000 [IU] | INTRAMUSCULAR | Status: DC | PRN
  Administered 2023-12-08: 3000 [IU] via INTRAVENOUS_CENTRAL
  Filled 2023-12-07 (×2): qty 6

## 2023-12-07 MED ORDER — RENA-VITE PO TABS
1.0000 | ORAL_TABLET | Freq: Every day | ORAL | Status: DC
  Administered 2023-12-07 – 2023-12-29 (×23): 1 via ORAL
  Filled 2023-12-07 (×23): qty 1

## 2023-12-07 MED ORDER — HEPARIN BOLUS VIA INFUSION
3000.0000 [IU] | Freq: Once | INTRAVENOUS | Status: AC
  Administered 2023-12-07: 3000 [IU] via INTRAVENOUS
  Filled 2023-12-07: qty 3000

## 2023-12-07 MED ORDER — INSULIN ASPART 100 UNIT/ML IJ SOLN
0.0000 [IU] | Freq: Three times a day (TID) | INTRAMUSCULAR | Status: DC
  Administered 2023-12-07: 4 [IU] via SUBCUTANEOUS
  Administered 2023-12-07: 7 [IU] via SUBCUTANEOUS
  Administered 2023-12-08: 3 [IU] via SUBCUTANEOUS
  Administered 2023-12-08 – 2023-12-09 (×3): 4 [IU] via SUBCUTANEOUS
  Administered 2023-12-09 – 2023-12-11 (×2): 7 [IU] via SUBCUTANEOUS
  Administered 2023-12-11: 3 [IU] via SUBCUTANEOUS
  Administered 2023-12-12: 15 [IU] via SUBCUTANEOUS
  Administered 2023-12-12 (×2): 4 [IU] via SUBCUTANEOUS
  Administered 2023-12-13: 7 [IU] via SUBCUTANEOUS
  Administered 2023-12-13 – 2023-12-14 (×2): 20 [IU] via SUBCUTANEOUS
  Administered 2023-12-14 (×2): 11 [IU] via SUBCUTANEOUS
  Administered 2023-12-15 (×2): 15 [IU] via SUBCUTANEOUS
  Administered 2023-12-15: 20 [IU] via SUBCUTANEOUS
  Administered 2023-12-16: 15 [IU] via SUBCUTANEOUS
  Administered 2023-12-16 (×2): 20 [IU] via SUBCUTANEOUS
  Administered 2023-12-17: 4 [IU] via SUBCUTANEOUS
  Administered 2023-12-17 (×2): 7 [IU] via SUBCUTANEOUS
  Administered 2023-12-18 (×2): 4 [IU] via SUBCUTANEOUS
  Administered 2023-12-18: 7 [IU] via SUBCUTANEOUS
  Administered 2023-12-19: 11 [IU] via SUBCUTANEOUS
  Administered 2023-12-19: 15 [IU] via SUBCUTANEOUS
  Administered 2023-12-19: 7 [IU] via SUBCUTANEOUS
  Administered 2023-12-20: 3 [IU] via SUBCUTANEOUS
  Administered 2023-12-20 (×2): 7 [IU] via SUBCUTANEOUS
  Administered 2023-12-21: 4 [IU] via SUBCUTANEOUS
  Administered 2023-12-21: 11 [IU] via SUBCUTANEOUS
  Administered 2023-12-21 – 2023-12-22 (×2): 7 [IU] via SUBCUTANEOUS
  Administered 2023-12-22: 15 [IU] via SUBCUTANEOUS
  Administered 2023-12-22 – 2023-12-23 (×2): 7 [IU] via SUBCUTANEOUS
  Administered 2023-12-23: 11 [IU] via SUBCUTANEOUS
  Administered 2023-12-23: 15 [IU] via SUBCUTANEOUS
  Administered 2023-12-24 – 2023-12-25 (×4): 11 [IU] via SUBCUTANEOUS
  Administered 2023-12-25: 7 [IU] via SUBCUTANEOUS
  Administered 2023-12-25 – 2023-12-26 (×2): 15 [IU] via SUBCUTANEOUS
  Administered 2023-12-26: 11 [IU] via SUBCUTANEOUS
  Administered 2023-12-26: 4 [IU] via SUBCUTANEOUS
  Administered 2023-12-27 (×2): 7 [IU] via SUBCUTANEOUS

## 2023-12-07 MED ORDER — PERFLUTREN LIPID MICROSPHERE
1.0000 mL | INTRAVENOUS | Status: AC | PRN
  Administered 2023-12-07: 4 mL via INTRAVENOUS

## 2023-12-07 MED ORDER — NOREPINEPHRINE 4 MG/250ML-% IV SOLN: 0.0000 ug/min | INTRAVENOUS | Status: DC

## 2023-12-07 MED ORDER — PRISMASOL BGK 0/2.5 32-2.5 MEQ/L EC SOLN
Status: DC
  Filled 2023-12-07 (×36): qty 5000

## 2023-12-07 MED ORDER — HEPARIN (PORCINE) 2000 UNITS/L FOR CRRT: INTRAVENOUS_CENTRAL | Status: DC | PRN

## 2023-12-07 MED ORDER — SODIUM ZIRCONIUM CYCLOSILICATE 10 G PO PACK
10.0000 g | PACK | Freq: Once | ORAL | Status: AC
  Administered 2023-12-07: 10 g via ORAL
  Filled 2023-12-07: qty 1

## 2023-12-07 MED ORDER — HEPARIN (PORCINE) 25000 UT/250ML-% IV SOLN
950.0000 [IU]/h | INTRAVENOUS | Status: DC
  Administered 2023-12-08: 1000 [IU]/h via INTRAVENOUS
  Administered 2023-12-08: 950 [IU]/h via INTRAVENOUS
  Filled 2023-12-07 (×2): qty 250

## 2023-12-07 MED ORDER — PRISMASOL BGK 4/2.5 32-4-2.5 MEQ/L EC SOLN
Status: DC
  Filled 2023-12-07 (×6): qty 5000

## 2023-12-07 MED ORDER — SODIUM BICARBONATE 8.4 % IV SOLN
50.0000 meq | Freq: Once | INTRAVENOUS | Status: AC
  Administered 2023-12-07: 50 meq via INTRAVENOUS
  Filled 2023-12-07: qty 50

## 2023-12-07 NOTE — Progress Notes (Signed)
  Echocardiogram 2D Echocardiogram has been performed.  Delcie Roch 12/07/2023, 10:06 AM

## 2023-12-07 NOTE — Plan of Care (Signed)
  Problem: Education: Goal: Knowledge of risk factors and measures for prevention of condition will improve Outcome: Progressing   Problem: Coping: Goal: Psychosocial and spiritual needs will be supported Outcome: Progressing   Problem: Respiratory: Goal: Will maintain a patent airway Outcome: Progressing Goal: Complications related to the disease process, condition or treatment will be avoided or minimized Outcome: Progressing   Problem: Education: Goal: Ability to describe self-care measures that may prevent or decrease complications (Diabetes Survival Skills Education) will improve Outcome: Progressing Goal: Individualized Educational Video(s) Outcome: Progressing   Problem: Coping: Goal: Ability to adjust to condition or change in health will improve Outcome: Progressing   Problem: Fluid Volume: Goal: Ability to maintain a balanced intake and output will improve Outcome: Progressing   Problem: Health Behavior/Discharge Planning: Goal: Ability to identify and utilize available resources and services will improve Outcome: Progressing

## 2023-12-07 NOTE — Progress Notes (Signed)
 PHARMACY - ANTICOAGULATION CONSULT NOTE  Pharmacy Consult for heparin  Indication:  concern for ACS and h/o Afib  Allergies  Allergen Reactions   Codeine Hives and Itching   Ofev  [Nintedanib ] Diarrhea    SEVERE DIARRHEA   Pirfenidone  Diarrhea and Other (See Comments)    Esbriet  (Pirfenidone ) causes elevated LFTs. D/C on 06/14/17 and SEVERE DIARRHEA     Patient Measurements: Height: 6' (182.9 cm) Weight: 109.4 kg (241 lb 2.9 oz) IBW/kg (Calculated) : 77.6 Heparin  Dosing Weight: 100kg  Vital Signs: Temp: 98.6 F (37 C) (01/02 0012) Temp Source: Oral (01/02 0012) BP: 85/32 (01/02 0009) Pulse Rate: 60 (01/02 0000)  Labs: Recent Labs    12/06/23 1840 12/06/23 2020 12/06/23 2128 12/06/23 2310 12/06/23 2311  HGB 9.5*  --   --   --  8.9*  HCT 30.1*  --   --   --  28.0*  PLT 192  --   --   --  161  APTT 37*  --   --   --   --   LABPROT 18.8*  --   --   --   --   INR 1.6*  --   --   --   --   CREATININE 4.69* 4.79*  --  5.16*  --   TROPONINIHS  --   --  8,290*  --  7,625*    Estimated Creatinine Clearance: 14.1 mL/min (A) (by C-G formula based on SCr of 5.16 mg/dL (H)).   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 10/2019 Echo: EF 55-60%, Gr2 DD. Nl RV.    Neuromuscular disorder (HCC)    Neuropathy associated with endocrine disorder (HCC)    Nonobstructive CAD (coronary artery disease)    a. 2012 Cath: mod, nonobs dzs; b. 10/2016 MV: EF 60%, no ischemia.   OSA on CPAP 05/05/2017   Unattended Home Sleep Test 7/2/813-AHI 38.6/hour, desaturation to 64%, body weight 261 pounds   PONV (postoperative nausea and vomiting)    Postoperative anemia due to acute blood loss 11/07/2020   Postoperative hemorrhagic shock 03/20/2021   Sleep apnea     uses cpap asked to bring mask and tubing    Medications:  Medications Prior to Admission  Medication Sig Dispense Refill Last Dose/Taking   albuterol  (PROVENTIL ) (2.5 MG/3ML) 0.083% nebulizer solution Take 2.5 mg by nebulization 2 (two) times daily as needed for wheezing or shortness of breath.   12/06/2023 Noon   albuterol  (VENTOLIN  HFA) 108 (90 Base) MCG/ACT inhaler Inhale 2 puffs  into the lungs every 4 (four) hours as needed for wheezing or shortness of breath. (Patient taking differently: Inhale 3 puffs into the lungs every 4 (four) hours as needed for wheezing or shortness of breath.) 6.7 g 0 12/06/2023 Evening   apixaban  (ELIQUIS ) 5 MG TABS tablet Take 1 tablet (5 mg total) by mouth 2 (two) times daily. (Patient taking differently: Take 2.5 mg by mouth 2 (two) times daily.) 60 tablet 11 12/05/2023 at  8:00 PM   Ascorbic Acid  (VITAMIN C  WITH ROSE HIPS) 500 MG tablet Take 1 tablet (500 mg total) by mouth daily. 30 tablet 0 12/05/2023 Bedtime   atorvastatin  (LIPITOR ) 80 MG tablet TAKE 1 TABLET BY MOUTH EVERY DAY (Patient taking differently: Take 80 mg by mouth at bedtime.) 90 tablet 3 12/05/2023 Bedtime   clopidogrel  (PLAVIX ) 75 MG tablet Take 75 mg by mouth daily.   12/05/2023 Morning   diphenhydrAMINE  HCl, Sleep, (ZZZQUIL PO) Take 15 mLs by mouth at bedtime as needed (cough).   Past Week   docusate sodium  (COLACE) 100 MG capsule  Take 1 capsule (100 mg total) by mouth daily. (Patient taking differently: Take 100 mg by mouth as needed for mild constipation or moderate constipation.) 10 capsule 0 Past Week   escitalopram  (LEXAPRO ) 10 MG tablet Take 1 tablet (10 mg total) by mouth daily. 30 tablet 0 12/05/2023 Morning   FARXIGA  10 MG TABS tablet Take 10 mg by mouth daily.   12/05/2023 Morning   furosemide  (LASIX ) 20 MG tablet Take 3 tablets (60 mg total) by mouth daily. (Patient taking differently: Take 20-40 mg by mouth See admin instructions. Take 2 tablets by mouth in the morning and 1 tablet at night) 90 tablet 0 12/06/2023 Morning   HUMALOG  KWIKPEN 100 UNIT/ML KwikPen Inject 10-15 Units into the skin 3 (three) times daily. Depending what his blood sugar reading is   12/05/2023 Evening   HYDROMET 5-1.5 MG/5ML syrup Take 5 mLs by mouth as needed for cough.   12/05/2023   lisinopril  (ZESTRIL ) 10 MG tablet Take 10 mg by mouth daily.   12/05/2023 Morning   LORazepam  (ATIVAN ) 1 MG tablet Take 1 tablet (1 mg total) by mouth at bedtime. (Patient taking differently: Take 1 mg by mouth at bedtime as needed (sleep).) 30 tablet 0 Past Week   metoprolol  tartrate (LOPRESSOR ) 25 MG tablet Take 25 mg by mouth daily.   12/05/2023 Morning   Multiple Vitamin (MULTIVITAMIN WITH MINERALS) TABS tablet Take 1 tablet by mouth daily.   12/05/2023 Morning   mupirocin  ointment (BACTROBAN ) 2 % Apply 1 Application topically at bedtime.   Past Week   nitroGLYCERIN  (NITRODUR - DOSED IN MG/24 HR) 0.2 mg/hr patch Place 1 patch (0.2 mg total) onto the skin daily. 30 patch 12 12/06/2023 Bedtime   Olopatadine HCl (PATADAY OP) Place 2 drops into both eyes daily as needed (allergies).   Past Week   ondansetron  (ZOFRAN ) 4 MG tablet Take 4 mg by mouth as needed for nausea or vomiting.   Past Week   pantoprazole  (PROTONIX ) 40 MG tablet Take 1 tablet (40 mg total) by mouth 2 (two) times daily. (Patient taking differently: Take 40 mg by mouth daily.) 60 tablet 0  12/05/2023 Morning   polyethylene glycol powder (GLYCOLAX /MIRALAX ) 17 GM/SCOOP powder Take 17 g by mouth every other day.   Past Month   traZODone  (DESYREL ) 50 MG tablet Take 75 mg by mouth at bedtime as needed for sleep.   12/05/2023 Bedtime   TRESIBA  FLEXTOUCH 100 UNIT/ML  FlexTouch Pen Inject 20 Units into the skin daily.   12/05/2023 Morning   zinc  sulfate 220 (50 Zn) MG capsule Take 1 capsule (220 mg total) by mouth daily. 30 capsule 0 12/05/2023   gabapentin  (NEURONTIN ) 300 MG capsule TAKE 1 CAPSULE BY MOUTH THREE TIMES A DAY (Patient taking differently: Take 300 mg by mouth 2 (two) times daily.) 180 capsule 3    Scheduled:   Chlorhexidine  Gluconate Cloth  6 each Topical Daily   clopidogrel   75 mg Oral Daily   dexamethasone  (DECADRON ) injection  6 mg Intravenous Q24H   docusate sodium   100 mg Oral BID   escitalopram   10 mg Oral Daily   insulin  aspart  0-9 Units Subcutaneous Q4H   ipratropium-albuterol   3 mL Nebulization Q6H   ondansetron        pantoprazole   40 mg Oral Daily   sodium zirconium cyclosilicate   10 g Oral Once   Infusions:   sodium chloride  Stopped (12/07/23 0008)   heparin      norepinephrine  (LEVOPHED ) Adult infusion 2 mcg/min (12/07/23 0004)   remdesivir  200 mg in sodium chloride  0.9% 250 mL IVPB     Followed by   remdesivir  100 mg in sodium chloride  0.9 % 100 mL IVPB      Assessment: 83yo male c/o SOB, found to be Covid+, troponin also elevated >> concern for NSTEMI, to transition from apixaban  for Afib to heparin ; last dose of apixaban  was taken 12/31 pm.  Goal of Therapy:  Heparin  level 0.3-0.7 units/ml aPTT 66-102 seconds Monitor platelets by anticoagulation protocol: Yes   Plan:  Small heparin  bolus 3000 units. Start heparin  infusion at 1500 units/hr. Monitor heparin  levels, aPTT (while DOAC affects anti-Xa assay), and CBC.  Marvetta Dauphin, PharmD, BCPS  12/07/2023,12:31 AM

## 2023-12-07 NOTE — Progress Notes (Signed)
 Initial Nutrition Assessment  DOCUMENTATION CODES:   Not applicable  INTERVENTION:  Liberalize diet to regular given increased nutrition needs d/t CRRT and report of chronically poor appetite Protein containing snacks TID between meals Recommend SLP evaluation d/t pt report of difficulty with solid foods Ensure Enlive po BID, each supplement provides 350 kcal and 20 grams of protein. Renal MVI with minerals daily Check micronutrient labs: Vitamin A , Vitamin C , zinc , copper , CRP  NUTRITION DIAGNOSIS:   Inadequate oral intake related to chronic illness (CAD, AS, CKD, DM) as evidenced by per patient/family report.  GOAL:   Patient will meet greater than or equal to 90% of their needs  MONITOR:   PO intake, Supplement acceptance, Labs, Weight trends, Diet advancement, I & O's, Skin  REASON FOR ASSESSMENT:   Consult Assessment of nutrition requirement/status (CRRT protocol)  ASSESSMENT:   Pt admitted with AKI and hypotension, found to be COVID+ on admission. 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   01/01: admitted 01/02: CRRT initiated  Spoke with pt and his wife at bedside. Pt is in good spirits today.  He reports that his appetite and intake has been very poor for several years, following CABG in 2021. He is still working, therefore does not typically eat lunch. He used to love meats such as beef and chicken however has not had the taste for these items. He often will either only consume a Premier Protein by itself or have a shake with eggs and 2 pieces of bacon for breakfast. Dinner, he recalls, recently has included tomato soup and often a pimento cheese grilled cheese. Per his wife, his intake has been more significantly less than normal recently.    Pt endorses early satiety secondary to edema/stomach swelling. Abdomen distended and taut today though pt  reports is improving. Pt denies having a BM since admission, of note on admission pt had reported 2 days of diarrhea PTA.  He denies chewing difficulty but endorses difficulty with swallowing solids, feeling as though it gets stuck and has to wash them down with water. However he mentions that he does not have issues with crackers.   Pt with chronic L foot wound for which he is followed outpatient and receives debridements for. He recalls taking vitamin c  and zinc  to support would healing at home. Pt is agreeable to micronutrient/vitamin lab checks to assess current levels and necessity for ongoing vitamin intake.   Cardiology following for NSTEMI versus demand ischemia in the setting of COVID.   [name]: x24 hours CRRT: started this morning, minimal UF documented  Current weight: 109.4 kg Pt reports that his weight has been stable at 235 lbs (with R prosthetic) prior to and with fluid retention.  Per review of chart, no significant weight changes noted over the last year.   Drains/lines: Non-tunneled HD cath  Medications: decadron , colace BID, SSI 0-9 units q4h, protonix   Labs:  Sodium 130 Potassium 6.7 (H) BUN 123 Cr 5.23 Anion gap 17 Phos 6.8 GFR 10 CBG's 144-269 x24 hours (+on steroids)  NUTRITION - FOCUSED PHYSICAL EXAM:  Flowsheet Row Most Recent Value  Orbital Region No depletion  Upper Arm Region Mild depletion  Thoracic and Lumbar Region No depletion  Buccal Region No depletion  Temple Region No depletion  Clavicle Bone Region Mild depletion  Clavicle and Acromion Bone Region No depletion  Scapular Bone Region No depletion  Dorsal Hand Unable to assess  [edema]  Patellar Region Moderate depletion  Anterior Thigh Region Mild depletion  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 Yes; Fluid consistency: Thin  Diet effective now                   EDUCATION NEEDS:   Education needs have been addressed  Skin:  Skin Assessment: Skin Integrity Issues: Skin Integrity Issues:: Other (Comment) Other: L foot non-pressure wound 2.5cm x 1.5 cm  Last BM:  PTA  Height:   Ht Readings from Last 1 Encounters:  12/06/23 6' (1.829 m)    Weight:   Wt Readings from Last 1 Encounters:  12/07/23 109.4 kg    Ideal Body Weight:  80.9 kg  BMI:  Body mass index is 32.71 kg/m.  Estimated Nutritional Needs:   Kcal:  2000-2200  Protein:  130-145g  Fluid:  1L + [name]  Shane [name], RDN, LDN Clinical Nutrition

## 2023-12-07 NOTE — Plan of Care (Signed)
   Problem: Education: Goal: Knowledge of risk factors and measures for prevention of condition will improve Outcome: Progressing   Problem: Coping: Goal: Psychosocial and spiritual needs will be supported Outcome: Progressing   Problem: Respiratory: Goal: Will maintain a patent airway Outcome: Progressing Goal: Complications related to the disease process, condition or treatment will be avoided or minimized Outcome: Progressing   Problem: Education: Goal: Ability to describe self-care measures that may prevent or decrease complications (Diabetes Survival Skills Education) will improve Outcome: Progressing Goal: Individualized Educational Video(s) Outcome: Progressing   Problem: Coping: Goal: Ability to adjust to condition or change in health will improve Outcome: Progressing   Problem: Fluid Volume: Goal: Ability to maintain a balanced intake and output will improve Outcome: Progressing   Problem: Health Behavior/Discharge Planning: Goal: Ability to identify and utilize available resources and services will improve Outcome: Progressing Goal: Ability to manage health-related needs will improve Outcome: Progressing   Problem: Metabolic: Goal: Ability to maintain appropriate glucose levels will improve Outcome: Progressing   Problem: Nutritional: Goal: Maintenance of adequate nutrition will improve Outcome: Progressing Goal: Progress toward achieving an optimal weight will improve Outcome: Progressing   Problem: Skin Integrity: Goal: Risk for impaired skin integrity will decrease Outcome: Progressing   Problem: Tissue Perfusion: Goal: Adequacy of tissue perfusion will improve Outcome: Progressing   Problem: Education: Goal: Knowledge of General Education information will improve Description: Including pain rating scale, medication(s)/side effects and non-pharmacologic comfort measures Outcome: Progressing   Problem: Health Behavior/Discharge Planning: Goal:  Ability to manage health-related needs will improve Outcome: Progressing   Problem: Clinical Measurements: Goal: Ability to maintain clinical measurements within normal limits will improve Outcome: Progressing Goal: Will remain free from infection Outcome: Progressing Goal: Diagnostic test results will improve Outcome: Progressing Goal: Respiratory complications will improve Outcome: Progressing Goal: Cardiovascular complication will be avoided Outcome: Progressing   Problem: Activity: Goal: Risk for activity intolerance will decrease Outcome: Progressing   Problem: Nutrition: Goal: Adequate nutrition will be maintained Outcome: Progressing   Problem: Coping: Goal: Level of anxiety will decrease Outcome: Progressing   Problem: Elimination: Goal: Will not experience complications related to bowel motility Outcome: Progressing Goal: Will not experience complications related to urinary retention Outcome: Progressing   Problem: Pain Management: Goal: General experience of comfort will improve Outcome: Progressing   Problem: Safety: Goal: Ability to remain free from injury will improve Outcome: Progressing   Problem: Skin Integrity: Goal: Risk for impaired skin integrity will decrease Outcome: Progressing

## 2023-12-07 NOTE — Progress Notes (Signed)
 PCCM Interval Note:  Refractory hyperkalemia despite temporizing. HD cath was placed. Started on CRRT. Repaged cardiology for consult on NSTEMI. He is on heparin gtt.

## 2023-12-07 NOTE — Progress Notes (Signed)
 PHARMACY - ANTICOAGULATION CONSULT NOTE  Pharmacy Consult for heparin  Indication:  concern for ACS and h/o Afib  Allergies  Allergen Reactions   Codeine Hives and Itching   Ofev  [Nintedanib ] Diarrhea    SEVERE DIARRHEA   Pirfenidone  Diarrhea and Other (See Comments)    Esbriet  (Pirfenidone ) causes elevated LFTs. D/C on 06/14/17 and SEVERE DIARRHEA     Patient Measurements: Height: 6' (182.9 cm) Weight: 109.4 kg (241 lb 2.9 oz) IBW/kg (Calculated) : 77.6 Heparin  Dosing Weight: 100kg  Vital Signs: Temp: 98.3 F (36.8 C) (01/02 1936) Temp Source: Oral (01/02 1936) BP: 120/58 (01/02 2300) Pulse Rate: 63 (01/02 2300)  Labs: Recent Labs    12/06/23 1840 12/06/23 2020 12/06/23 2128 12/06/23 2310 12/06/23 2311 12/07/23 0116 12/07/23 0405 12/07/23 0500 12/07/23 0949 12/07/23 1119 12/07/23 1554 12/07/23 2241  HGB 9.5*  --   --   --  8.9* 9.1*  --   --   --   --   --  9.2*  HCT 30.1*  --   --   --  28.0* 28.6*  --   --   --   --   --  27.7*  PLT 192  --   --   --  161 175  --   --   --   --   --  168  APTT 37*  --   --   --   --   --    < >  --  >200* >200*  --  133*  LABPROT 18.8*  --   --   --   --   --   --   --   --   --   --   --   INR 1.6*  --   --   --   --   --   --   --   --   --   --   --   HEPARINUNFRC  --   --   --   --   --   --   --   --   --  >1.10*  --   --   CREATININE 4.69*   < >  --    < >  --  5.29*  --  5.23*  --   --  2.72*  --   TROPONINIHS  --   --  8,290*  --  7,625*  --   --   --   --   --   --   --    < > = values in this interval not displayed.    Estimated Creatinine Clearance: 26.7 mL/min (A) (by C-G formula based on SCr of 2.72 mg/dL (H)).  Assessment: 83yo male c/o SOB, found to be Covid+, troponin also elevated >> concern for NSTEMI, to transition from apixaban  for Afib to heparin ; last dose of apixaban  was taken 12/31 pm.  Current on Heparin  IV at 1200 units/hr.  aPTT decreasing but still elevated at 133 Noted to have bright red  blood per IV above where heparin  is running, but has been unchanged. CBC shows Hgb low, stable, plts 168  Goal of Therapy:  Heparin  level 0.3-0.7 units/ml aPTT 66-102 seconds Monitor platelets by anticoagulation protocol: Yes   Plan:  Stop IV heparin  for 1 hour  Monitor further bleeding and if bleeding resolved, then resume at lower rate of 1000 units/hr.  Monitor heparin  levels, aPTT (while DOAC affects anti-Xa assay), and CBC.  Lynwood Poplar, PharmD.  Clinical Pharmacist 12/07/2023 11:24 PM

## 2023-12-07 NOTE — Progress Notes (Signed)
 eLink Physician-Brief Progress Note Patient Name: Shane [name] DOB: Jul 28, 1941 MRN: [id number]   Date of Service  12/07/2023  HPI/Events of Note  83 year old male with a history of obstructive sleep apnea, coronary artery disease status post CABG, aortic stenosis status post TAVR, and heart failure with preserved ejection fraction on chronic anticoagulation with pacemaker in place complicated by CKD and pulmonary fibrosis who presents to the emergency department with dyspnea. He is found to be COVID-positive with acute respiratory failure and hypoxemia complicated by an acute kidney injury requiring renal replacement therapy.  Requesting home melatonin  Complaining of bloating  eICU Interventions  Resume home melatonin  No intervention for bloating at this time     Intervention Category Minor Interventions: Routine modifications to care plan (e.g. PRN medications for pain, fever)  Shane [name] 12/07/2023, 11:37 PM

## 2023-12-07 NOTE — Progress Notes (Signed)
 K 7.1>6.2. retemporizing with insulin /dextrose , bicarb. K in 1 hr post. On 3LNC. Back on levo now at 4mcg. If increasing requirements will likely place trialysis catheter. Started on IV heparin  due to significant troponinemia. Likely NSTEMI. This may have precipitated current clinical picture. Shock may be mixed cardiogenic/sepsis. Pending cards input.

## 2023-12-07 NOTE — Procedures (Signed)
 Central Venous Catheter Insertion Procedure Note  Shane [name]  [id number]  [birth year]  Date:12/07/23  Time:3:02 AM   Provider Performing:Geeta Dworkin FORBES  [name]   Procedure: Insertion of Non-tunneled Central Venous Catheter(36556)with US  guidance (23062)    Indication(s) Hemodialysis  Consent Risks of the procedure as well as the alternatives and risks of each were explained to the patient and/or caregiver.  Consent for the procedure was obtained and is signed in the bedside chart  Anesthesia Topical only with 1% lidocaine    Timeout Verified patient identification, verified procedure, site/side was marked, verified correct patient position, special equipment/implants available, medications/allergies/relevant history reviewed, required imaging and test results available.  Sterile Technique Maximal sterile technique including full sterile barrier drape, hand hygiene, sterile gown, sterile gloves, mask, hair covering, sterile ultrasound probe cover (if used).  Procedure Description Area of catheter insertion was cleaned with chlorhexidine  and draped in sterile fashion.   With real-time ultrasound guidance a HD catheter was placed into the right internal jugular vein.  Nonpulsatile blood flow and easy flushing noted in all ports.  The catheter was sutured in place and sterile dressing applied.   Complications/Tolerance None; patient tolerated the procedure well. Chest X-ray is ordered to verify placement for internal jugular or subclavian cannulation.  Chest x-ray is not ordered for femoral cannulation.  EBL Minimal  Specimen(s) None   Under supervision of Deward Eastern, NP  Tinnie FORBES [name], PA-C [facility] 12/07/23 3:03 AM  Please see [email or web address] for pager details.  From 7A-7P if no response, please call [phone] After hours, please call ELink [phone]

## 2023-12-07 NOTE — Progress Notes (Signed)
 Removed nitroglycerin patch on left foot. Witnessed by Wynona Luna, RN

## 2023-12-07 NOTE — Progress Notes (Signed)
 [facility] Progress Note   Assessment/ Plan:   1.  Acute kidney injury: Appears to be hemodynamically mediated possibly from a combination of CHF exacerbation +/- sepsis with evolution to ATN.   - CRRT- K still high- change to all 2K bath - UF as tolerated  2.  Hyperkalemia:  - CRRT will correct this  3.  Anion gap metabolic acidosis:  - CRRT will correct this  4.  Shock: Appears to be largely cardiogenic but cannot rule out septic component with presentation symptoms suggestive of LRTI.  He has been started on antibiotic coverage for community-acquired pneumonia.  5.  Acute hypoxic respiratory failure: Appears to be from a combination of CHF exacerbation +/- underlying interstitial lung disease and now LRTI/community-acquired pneumonia.  On oxygen supplementation and antimicrobial coverage.  6.  Anemia: Monitor hemoglobin/hematocrit trend anticipating some improvement with diuresis.    Subjective:    Seen in room.  Started CRRT overnight d/t persistent hyperkalemia.  Improving somewhat.  Pt more awake/ alert than previously described, able to move muscles better.     Objective:   BP (!) 111/48   Pulse 61   Temp 98 F (36.7 C) (Oral)   Resp 16   Ht 6' (1.829 m)   Wt 109.4 kg   SpO2 98%   BMI 32.71 kg/m   Intake/Output Summary (Last 24 hours) at 12/07/2023 0945 Last data filed at 12/07/2023 0900 Gross per 24 hour  Intake 872.59 ml  Output 1279.7 ml  Net -407.11 ml   Weight change:   Physical Exam: Hzw:noizm gentleman, appears ill CVS: brady, paced Resp: Clear Abd: soft Ext: + multiple bruises ACCESS: R internal jugular nontunneled HD cath  Imaging: DG CHEST PORT 1 VIEW Result Date: 12/07/2023 CLINICAL DATA:  Central line placement. EXAM: PORTABLE CHEST 1 VIEW COMPARISON:  Radiograph yesterday. FINDINGS: Right internal jugular central venous catheter tip overlies the upper SVC. No pneumothorax. Median sternotomy. Stable heart size and mediastinal  contours. TAVR and lead less pacemaker. Pulmonary edema and small pleural effusions. IMPRESSION: 1. Right internal jugular central venous catheter tip overlies the upper SVC. No pneumothorax. 2. Pulmonary edema and small pleural effusions. Electronically Signed   By: Andrea Gasman M.D.   On: 12/07/2023 03:41   US  RENAL Result Date: 12/06/2023 CLINICAL DATA:  Acute renal insufficiency EXAM: RENAL / URINARY TRACT ULTRASOUND COMPLETE COMPARISON:  04/27/2018 FINDINGS: Right Kidney: Renal measurements: 11.8 x 5.2 x 4.3 cm = volume: 137.2 mL. Echogenicity within normal limits. No mass or hydronephrosis visualized. Left Kidney: Renal measurements: 11.7 x 5.6 x 4.5 cm = volume: 154.4 mL. Echogenicity within normal limits. No mass or hydronephrosis visualized. Bladder: Appears normal for degree of bladder distention. Other: None. IMPRESSION: 1. Unremarkable renal ultrasound. Electronically Signed   By: Ozell Daring M.D.   On: 12/06/2023 22:10   DG Chest Port 1 View Result Date: 12/06/2023 CLINICAL DATA:  SOB. EXAM: PORTABLE CHEST - 1 VIEW COMPARISON:  06/21/2023. FINDINGS: Cardiac silhouette is prominent. There is pulmonary interstitial prominence with vascular congestion. No focal consolidation. No pneumothorax. There are bilateral pleural effusions. 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 IMPRESSION: Findings suggest CHF. Electronically Signed   By: Fonda Field M.D.   On: 12/06/2023 19:13    Labs: BMET Recent Labs  Lab 12/06/23 1840 12/06/23 2020 12/06/23 2310 12/07/23 0116 12/07/23 0500  NA 130* 129*  --  128* 130*  K 7.1* >7.5* 6.2* 6.3* 6.7*  CL 100 96*  --  94* 96*  CO2 16* 18*  --  15* 17*  GLUCOSE 165* 165*  --  282* 321*  BUN 116* 118*  --  122* 123*  CREATININE 4.69* 4.79* 5.16* 5.29* 5.23*  CALCIUM  7.9* 8.3*  --  8.2* 8.2*  PHOS  --   --  7.6* 7.3* 6.8*   CBC Recent Labs  Lab 12/06/23 1840 12/06/23 2311 12/07/23 0116  WBC 8.8 8.7 7.7   NEUTROABS 6.7  --   --   HGB 9.5* 8.9* 9.1*  HCT 30.1* 28.0* 28.6*  MCV 86.2 85.6 83.6  PLT 192 161 175    Medications:     atorvastatin   80 mg Oral Daily   Chlorhexidine  Gluconate Cloth  6 each Topical Daily   clopidogrel   75 mg Oral Daily   dexamethasone  (DECADRON ) injection  6 mg Intravenous Q24H   docusate sodium   100 mg Oral BID   escitalopram   10 mg Oral Daily   insulin  aspart  0-9 Units Subcutaneous Q4H   ipratropium-albuterol   3 mL Nebulization Q6H   pantoprazole   40 mg Oral Daily    Almarie Bonine MD 12/07/2023, 9:45 AM

## 2023-12-07 NOTE — Consult Note (Signed)
 WOC Nurse Consult Note: Reason for Consult: Consult requested for left foot.  Performed remotely after review of progress notes and photo in the EMR. Left foot with full thickness wound; red and moist, 2.5X1.5cm, according to the bedside nurse's wound care flow sheet. Dressing procedure/placement/frequency: Topical treatment orders provided for bedside nurses to perform as follows to absorb drainage and provide antimicrobial benefits: Apply a piece of Aquacel Soila # [phone]) to left foot wound Q day, then cover with foam dressing.  Change foam dressing Q 3 days or PRN soiling. Please re-consult if further assistance is needed.  Thank-you,  Stephane Fought MSN, RN, CWOCN, [place], CNS [phone]

## 2023-12-07 NOTE — Progress Notes (Signed)
 Progress Note  Patient Name: Shane [name] Date of Encounter: 12/07/2023  Primary Cardiologist: Redell Shallow, MD   Subjective   Patient seen and examined at his bedside.  Undergoing hemodialysis during my visit.  Wife at bedside.  Inpatient Medications    Scheduled Meds:  atorvastatin   80 mg Oral Daily   Chlorhexidine  Gluconate Cloth  6 each Topical Daily   clopidogrel   75 mg Oral Daily   dexamethasone  (DECADRON ) injection  6 mg Intravenous Q24H   docusate sodium   100 mg Oral BID   escitalopram   10 mg Oral Daily   insulin  aspart  0-20 Units Subcutaneous TID WC   insulin  aspart  0-5 Units Subcutaneous QHS   insulin  glargine-yfgn  10 Units Subcutaneous Daily   ipratropium-albuterol   3 mL Nebulization Q6H   pantoprazole   40 mg Oral Daily   Continuous Infusions:  sodium chloride  Stopped (12/07/23 0008)   heparin  1,500 Units/hr (12/07/23 1100)   norepinephrine  (LEVOPHED ) Adult infusion Stopped (12/07/23 0805)   prismasol  BGK 2/2.5 dialysis solution 1,500 mL/hr at 12/07/23 0947   prismasol  BGK 2/2.5 replacement solution 400 mL/hr at 12/07/23 0949   prismasol  BGK 2/2.5 replacement solution 400 mL/hr at 12/07/23 0950   remdesivir  100 mg in sodium chloride  0.9 % 100 mL IVPB Stopped (12/07/23 0958)   PRN Meds: acetaminophen , heparin , heparin  sodium (porcine), heparin , ondansetron  (ZOFRAN ) IV, mouth rinse, perflutren  lipid microspheres (DEFINITY ) IV suspension, polyethylene glycol   Vital Signs    Vitals:   12/07/23 1015 12/07/23 1030 12/07/23 1045 12/07/23 1100  BP: (!) 102/48 (!) 88/70 (!) 89/69 106/66  Pulse: 62 65 72 70  Resp: 18 20 (!) 24 16  Temp:      TempSrc:      SpO2: 99% 99% 90% 100%  Weight:      Height:        Intake/Output Summary (Last 24 hours) at 12/07/2023 1109 Last data filed at 12/07/2023 1100 Gross per 24 hour  Intake 1002.49 ml  Output 1479.7 ml  Net -477.21 ml   Filed Weights   12/06/23 1805 12/07/23 0000 12/07/23 0344  Weight: 102 kg 109.4  kg 109.4 kg    Telemetry    Sinus rhythm - Personally Reviewed  ECG     - Personally Reviewed  Physical Exam     General: Comfortable, sitting up in a chair Head: Atraumatic, normal size  Eyes: PEERLA, EOMI  Neck: Supple, normal JVD Cardiac: Normal S1, S2; RRR; no murmurs, rubs, or gallops Lungs: Clear to auscultation bilaterally Abd: Soft, nontender, no hepatomegaly  Ext: warm, no edema Musculoskeletal: No deformities, BUE and BLE strength normal and equal Skin: Warm and dry, no rashes   Neuro: Alert and oriented to person, place, time, and situation, CNII-XII grossly intact, no focal deficits  Psych: Normal mood and affect   Labs    Chemistry Recent Labs  Lab 12/06/23 1840 12/06/23 2020 12/06/23 2310 12/07/23 0116 12/07/23 0500 12/07/23 0949  NA 130* 129*  --  128* 130*  --   K 7.1* >7.5* 6.2* 6.3* 6.7* 5.7*  CL 100 96*  --  94* 96*  --   CO2 16* 18*  --  15* 17*  --   GLUCOSE 165* 165*  --  282* 321*  --   BUN 116* 118*  --  122* 123*  --   CREATININE 4.69* 4.79* 5.16* 5.29* 5.23*  --   CALCIUM  7.9* 8.3*  --  8.2* 8.2*  --   PROT 6.8  --   --   --   --   --  ALBUMIN  2.9*  --   --   --  2.9*  --   AST 45*  --   --   --   --   --   ALT 16  --   --   --   --   --   ALKPHOS 85  --   --   --   --   --   BILITOT 0.6  --   --   --   --   --   GFRNONAA 12* 11* 10* 10* 10*  --   ANIONGAP 14 15  --  19* 17*  --      Hematology Recent Labs  Lab 12/06/23 1840 12/06/23 2311 12/07/23 0116  WBC 8.8 8.7 7.7  RBC 3.49* 3.27* 3.42*  HGB 9.5* 8.9* 9.1*  HCT 30.1* 28.0* 28.6*  MCV 86.2 85.6 83.6  MCH 27.2 27.2 26.6  MCHC 31.6 31.8 31.8  RDW 19.1* 18.8* 18.9*  PLT 192 161 175    Cardiac EnzymesNo results for input(s): TROPONINI in the last 168 hours. No results for input(s): TROPIPOC in the last 168 hours.   BNP Recent Labs  Lab 12/06/23 2310  BNP 480.6*     DDimer No results for input(s): DDIMER in the last 168 hours.   Radiology     ECHOCARDIOGRAM COMPLETE Result Date: 12/07/2023    ECHOCARDIOGRAM REPORT   Patient Name:   Shane [name] Date of Exam: 12/07/2023 Medical Rec #:  [id number]        Height:       72.0 in Accession #:    [id number]       Weight:       241.2 lb Date of Birth:  19-Aug-1941        BSA:          2.307 m Patient Age:    82 years         BP:           120/54 mmHg Patient Gender: M                HR:           75 bpm. Exam Location:  Inpatient Procedure: 2D Echo, Color Doppler, Cardiac Doppler and Intracardiac            Opacification Agent STAT ECHO Indications:    NSTEMI  History:        Patient has prior history of Echocardiogram examinations, most                 recent 06/22/2023. CHF, Pacemaker and Prior CABG, chronic kidney                 disease. Covid., Arrythmias:RBBB; Risk Factors:Sleep Apnea,                 Diabetes and Hypertension.                 Aortic Valve: 29 mm Edwards Sapien prosthetic, stented (TAVR)                 valve is present in the aortic position. Procedure Date:                 08/16/23.  Sonographer:    Tinnie Barefoot RDCS Referring Phys: [id number] FONDA JAYSON SHARPS  Sonographer Comments: Technically difficult study due to poor echo windows. Image acquisition challenging due to patient body habitus. IMPRESSIONS  1. Left ventricular ejection fraction, by estimation,  is 40%. The left ventricle has mild to moderately decreased function. The left ventricle demonstrates regional wall motion abnormalities with septal and inferior severe hypokinesis. Even with Definity  contrast, images were poor. Left ventricular diastolic parameters are indeterminate.  2. Peak RV-RA gradient 33 mmHg. IVC not visualized. Right ventricular systolic function is moderately reduced. The right ventricular size is normal.  3. Left atrial size was moderately dilated.  4. Right atrial size was mildly dilated.  5. The mitral valve is normal in structure. Mild to moderate mitral valve regurgitation. No evidence of mitral  stenosis.  6. Bioprosthetic aortic valve s/p TAVR with 29 mm Edwards Sapien THV. Mean gradient 8 mmHg, EOA 1.57 cm^2 with dimensionless index 0.46. No peri-valvular leakage noted. The aortic valve has been repaired/replaced. Aortic valve regurgitation is not visualized.  7. Aortic dilatation noted. There is mild dilatation of the ascending aorta, measuring 39 mm. FINDINGS  Left Ventricle: Left ventricular ejection fraction, by estimation, is 40%. The left ventricle has mild to moderately decreased function. The left ventricle demonstrates regional wall motion abnormalities. Definity  contrast agent was given IV to delineate the left ventricular endocardial borders. The left ventricular internal cavity size was normal in size. There is no left ventricular hypertrophy. Left ventricular diastolic parameters are indeterminate. Right Ventricle: Peak RV-RA gradient 33 mmHg. IVC not visualized. The right ventricular size is normal. No increase in right ventricular wall thickness. Right ventricular systolic function is moderately reduced. Left Atrium: Left atrial size was moderately dilated. Right Atrium: Right atrial size was mildly dilated. Pericardium: There is no evidence of pericardial effusion. Mitral Valve: The mitral valve is normal in structure. Mild mitral annular calcification. Mild to moderate mitral valve regurgitation. No evidence of mitral valve stenosis. Tricuspid Valve: The tricuspid valve is normal in structure. Tricuspid valve regurgitation is trivial. Aortic Valve: Bioprosthetic aortic valve s/p TAVR with 29 mm Edwards Sapien THV. Mean gradient 8 mmHg, EOA 1.57 cm^2 with dimensionless index 0.46. No peri-valvular leakage noted. The aortic valve has been repaired/replaced. Aortic valve regurgitation is  not visualized. Aortic valve mean gradient measures 8.0 mmHg. Aortic valve peak gradient measures 13.8 mmHg. Aortic valve area, by VTI measures 1.57 cm. There is a 29 mm Edwards Sapien prosthetic, stented  (TAVR) valve present in the aortic position. Procedure Date: 08/16/23. Pulmonic Valve: The pulmonic valve was normal in structure. Pulmonic valve regurgitation is trivial. Aorta: Aortic dilatation noted. There is mild dilatation of the ascending aorta, measuring 39 mm. IAS/Shunts: No atrial level shunt detected by color flow Doppler.  LEFT VENTRICLE PLAX 2D LVIDd:         5.40 cm LVIDs:         4.00 cm LV PW:         1.10 cm LV IVS:        1.10 cm LVOT diam:     2.20 cm LV SV:         64 LV SV Index:   28 LVOT Area:     3.80 cm  RIGHT VENTRICLE RV Basal diam:  2.90 cm RV S prime:     6.64 cm/s TAPSE (M-mode): 0.9 cm LEFT ATRIUM              Index        RIGHT ATRIUM           Index LA diam:        4.80 cm  2.08 cm/m   RA Area:     21.30 cm LA Vol (  A2C):   110.0 ml 47.68 ml/m  RA Volume:   58.90 ml  25.53 ml/m LA Vol (A4C):   117.0 ml 50.71 ml/m LA Biplane Vol: 121.0 ml 52.45 ml/m  AORTIC VALVE AV Area (Vmax):    1.59 cm AV Area (Vmean):   1.51 cm AV Area (VTI):     1.57 cm AV Vmax:           186.00 cm/s AV Vmean:          127.000 cm/s AV VTI:            0.410 m AV Peak Grad:      13.8 mmHg AV Mean Grad:      8.0 mmHg LVOT Vmax:         77.80 cm/s LVOT Vmean:        50.450 cm/s LVOT VTI:          0.169 m LVOT/AV VTI ratio: 0.41  AORTA Ao Root diam: 3.80 cm Ao Asc diam:  3.90 cm TRICUSPID VALVE TR Peak grad:   32.9 mmHg TR Vmax:        287.00 cm/s  SHUNTS Systemic VTI:  0.17 m Systemic Diam: 2.20 cm Dalton McleanMD Electronically signed by Ezra Kanner Signature Date/Time: 12/07/2023/9:49:35 AM    Final    DG CHEST PORT 1 VIEW Result Date: 12/07/2023 CLINICAL DATA:  Central line placement. EXAM: PORTABLE CHEST 1 VIEW COMPARISON:  Radiograph yesterday. FINDINGS: Right internal jugular central venous catheter tip overlies the upper SVC. No pneumothorax. Median sternotomy. Stable heart size and mediastinal contours. TAVR and lead less pacemaker. Pulmonary edema and small pleural effusions. IMPRESSION: 1.  Right internal jugular central venous catheter tip overlies the upper SVC. No pneumothorax. 2. Pulmonary edema and small pleural effusions. Electronically Signed   By: Andrea Gasman M.D.   On: 12/07/2023 03:41   US  RENAL Result Date: 12/06/2023 CLINICAL DATA:  Acute renal insufficiency EXAM: RENAL / URINARY TRACT ULTRASOUND COMPLETE COMPARISON:  04/27/2018 FINDINGS: Right Kidney: Renal measurements: 11.8 x 5.2 x 4.3 cm = volume: 137.2 mL. Echogenicity within normal limits. No mass or hydronephrosis visualized. Left Kidney: Renal measurements: 11.7 x 5.6 x 4.5 cm = volume: 154.4 mL. Echogenicity within normal limits. No mass or hydronephrosis visualized. Bladder: Appears normal for degree of bladder distention. Other: None. IMPRESSION: 1. Unremarkable renal ultrasound. Electronically Signed   By: Ozell Daring M.D.   On: 12/06/2023 22:10   DG Chest Port 1 View Result Date: 12/06/2023 CLINICAL DATA:  SOB. EXAM: PORTABLE CHEST - 1 VIEW COMPARISON:  06/21/2023. FINDINGS: Cardiac silhouette is prominent. There is pulmonary interstitial prominence with vascular congestion. No focal consolidation. No pneumothorax. There are bilateral pleural effusions. 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 IMPRESSION: Findings suggest CHF. Electronically Signed   By: Fonda Field M.D.   On: 12/06/2023 19:13    Cardiac Studies   Echo 12/07/2023  1. Left ventricular ejection fraction, by estimation, is 40%. The left ventricle has mild to moderately decreased function. The left ventricle demonstrates regional wall motion abnormalities with septal and inferior severe hypokinesis. Even with  Definity  contrast, images were poor. Left ventricular diastolic parameters  are indeterminate.   2. Peak RV-RA gradient 33 mmHg. IVC not visualized. Right ventricular  systolic function is moderately reduced. The right ventricular size is  normal.   3. Left atrial size was moderately dilated.   4.  Right atrial size was mildly dilated.   5. The mitral valve  is normal in structure. Mild to moderate mitral valve  regurgitation. No evidence of mitral stenosis.   6. Bioprosthetic aortic valve s/p TAVR with 29 mm Edwards Sapien THV.  Mean gradient 8 mmHg, EOA 1.57 cm^2 with dimensionless index 0.46. No  peri-valvular leakage noted. The aortic valve has been repaired/replaced.  Aortic valve regurgitation is not  visualized.   7. Aortic dilatation noted. There is mild dilatation of the ascending  aorta, measuring 39 mm.   Patient Profile     83 y.o. 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  Assessment & Plan    Coronary artery disease status post CABG Recent COVID 19 infection Status post TAVR NSTEMI in the setting of COVID-19 infection Depressed ejection fraction 40% compared to prior echocardiogram in July 2024 which is 50 to 55%   Noted NSTEMI with troponins peaking up into the 8000's.  This could be multiple reasons demand ischemia in the setting of COVID-19 infection, he currently does have wall motion abnormality with depressed ejection fraction which could be due to his COVID-19 infection.  He does have coronary artery disease therefore it is appropriate to continue heparin  drip for medical management for the next 48 hours, he is not on aspirin  at home giving his apixaban  for atrial fibrillation. Continue plavix  75 mg daily - will hold off on Aspirin  at this time.   I think overall it will be best for medical management at this time and optimize medication for his cardiomyopathy. Once blood pressure can tolerate - start toprol  xl 12.5 mg daily.   Bioprosthetic valve well-seated.   On HD,.    For questions or updates, please contact [facility] Please  consult [email or web address] for contact info under Cardiology/STEMI.   CRITICAL CARE Performed by: Gasper Hopes  Total critical care time: 40 minutes. Critical care time was exclusive of separately billable procedures and treating other patients. Critical care was necessary to treat or prevent imminent or life-threatening deterioration. Critical care was time spent personally by me on the following activities: development of treatment plan with patient and/or surrogate as well as nursing, discussions with consultants, evaluation of patient's response to treatment, examination of patient, obtaining history from patient or surrogate, ordering and performing treatments and interventions, ordering and review of laboratory studies, ordering and review of radiographic studies, pulse oximetry and re-evaluation of patient's condition.    Signed, Jailah Willis, DO [facility]   [facility]  12/07/2023 11:57 AM       Signed, Dub Huntsman, DO  12/07/2023, 11:09 AM

## 2023-12-07 NOTE — Progress Notes (Signed)
 NAME:  Shane [name], MRN:  [id number], DOB:  03/11/1941, LOS: 1 ADMISSION DATE:  12/06/2023, CONSULTATION DATE:  12/06/2023 REFERRING MD:  Ula, EDP, CHIEF COMPLAINT:  aki, hypotension  History of Present Illness:  83 year old male with significant past medical history including obstructive sleep apnea on CPAP at night, CAD s/p CABG x3 in 2021, aortic stenosis s/p TAVR 08/2023 at [facility], HFpEF, atrial fibrillation on Eliquis , plavix , sick sinus syndrome s/p leadless PPM, hypertension, hyperlipidemia, GERD, uncontrolled diabetes, CKD 3, pulmonary fibrosis followed by Dr. Geronimo, who presented to the emergency department on 12/06/2023 with shortness of breath.  Per the ED provider complaining of cough and shortness of breath over the past 3 days.  Using his nebulizers at home with no improvement in his symptoms.  EMS was called and found to be hypotensive and hypoxic.  He was started on nonrebreather and an epinephrine  infusion prior to arrival.  In the ED afebrile, SBP around 100, tachypneic.  Labs +COVID, AKI on CKD with BUN/Cr 116, 4.69 with critical K of 7.1. bicarb 16. CBC without leukocytosis, hgb 9.5. lactic 2.3>1.9, troponin 8,290, UA negative, blood cultures pending. CXR with likely pulmonary edema. With new AKI, hyperkalemia and EKG changes, nephrology was consulted. Patient given lokelma , albuterol , Lasix , sodium bicarb, 5U insulin  with D50, calcium  gluconate. Was also given azithromycin /rocephin  CAP coverage. Neprhology recommended albumin  for BP support (initially on 2mcg levo, now off), Lasix  80mg  IV, renal ultrasound. Pending repeat BMP on consult for admission to ICU.  On my exam, laying in bed, mildly tachypneic on albuterol  treatment. Alert and oriented. Endorses the above story. He does state he had diarrhea over the preceding 2 days and intermittent nausea without vomiting. Decreased appetite. He does feel volume overloaded from his baseline. States his abdomen feels tighter. Wife is at  bedside, endorses story.   Pertinent  Medical History  obstructive sleep apnea on CPAP at night, CAD s/p CABG x3 in 2021, aortic stenosis s/p TAVR 08/2023 at [facility], HFpEF, atrial fibrillation on Eliquis , plavix , sick sinus syndrome s/p leadless PPM, hypertension, hyperlipidemia, GERD, uncontrolled diabetes, CKD 3, pulmonary fibrosis  Significant Hospital Events: Including procedures, antibiotic start and stop dates in addition to other pertinent events   1/1: Admit to CCM for covid pneumonia, aki with hyperK likely needing CRRT  1/2: CRRT initiated, Card Consult due to elev Troponin (Nstemi vs Demand Ischemia)   Interim History / Subjective:  Patient is alert and oriented.  On nonrebreather on initial exam getting albuterol  treatment.  Currently denying any pain.  Does feel mildly short of breath and volume overloaded.  Objective   Blood pressure (!) 120/54, pulse (!) 59, temperature 98.2 F (36.8 C), temperature source Axillary, resp. rate 17, height 6' (1.829 m), weight 109.4 kg, SpO2 99%.        Intake/Output Summary (Last 24 hours) at 12/07/2023 0755 Last data filed at 12/07/2023 0700 Gross per 24 hour  Intake 833.85 ml  Output 1005 ml  Net -171.15 ml   Filed Weights   12/06/23 1805 12/07/23 0000 12/07/23 0344  Weight: 102 kg 109.4 kg 109.4 kg    Examination: General: chronically older adult male, lying in ICU bed HENT: CPAP, Poor dentition, Pink MM, HD cath Lungs: Cl breath sounds, diminished lower bases Cardiovascular: s1.s2, paced, No JVD, no MRG Abdomen: obese, soft, active BS  Extremities: moves all extremities, right BKA, partial left foot amputation Neuro: AO x 4, follows commands  GU: Foley, yellow urine   Resolved Hospital Problem list  Assessment & Plan:  Acute hypoxic respiratory failure in setting of Covid 19  Obstructive sleep apnea on CPAP (2 L baseline)  HFpEF  Pulmonary fibrosis Likely multifactorial in the setting of acute COVID-19 vs CHF exacerbation  vs Pulmonary Fibrosis flare vs CAP  Presented to ED hypoxic requiring NRB, now on 2L [place], SOB improving  1/1 Chest x-ray that does demonstrate some pulmonary edema in the setting of CHF and clinically has rales diffusely. Received x1 dose azithro/rocephin  in ED for CAP. Not febrile, no WBC. Lower suspicion for superimposed bacterial infection at present.  Received lasix  80mg  IV with no success, CRRT started due to fluid overload and AKI   Procal .22  P:  Continue Remdesivir  and Decadron  IV  Will change duonebs to PRN  O2 Sat goal > 92% Hold off CAP coverage for now  ECHO pending  Continue CRRT for fluid removal  Continue CPAP at night  Continue to hold off CAP coverage for now, trend Procal WBC, fever   Acute kidney injury on chronic kidney disease; baseline creatinine 1.7-1.9 Hyperkalemia with EKG changes Non anion gap metabolic acidosis Initial labs with BUN 116, sCr 4.69, potassium 7.1.  AKI also likely multifactorial in the setting of CHF, hemodynamic instability with hypotension initially reported.  Renal ultrasound is normal.   NAGMA likely sequela of renal dysfunction, also had double days of diarrhea. Hyperkalemia trending down  P: Nephro following, continue CRRT, appreciate assistance Dialysis Bath changed from 4K to 2K for hyperkalemia  Continue to trend renal function, mag, phos, optimize electrolytes Continue Foley cath, strict I/O  Continue to avoid nephrotoxic agents Continue adequate renal perfusion  Acute on chronic heart failure exacerbation;  Elevated Troponin  most recent echo 06/2023 EF 50-55%, LVH, G3DD, RV mod reduced, LA severely dilated, RA mod dilated troponin in the ED 8,290>> 7,625 BNP 480.6 EKG with paced rhythm.  Known significant cardiac disease.  He does report having episodes of chest pain over the past few days with his shortness of breath.  P: Echo order placed STAT, results pending, f/u  Cardiology following, appreciate reqs Continue heparin   gtt, will eventually transition back to eliquis  defer to Cards Continue to trend troponins  Acute on chronic anemia No signs of active bleeding. P: Continue to follow for signs of active bleeding Continue to trend CBC's daily, trend H/H    Type 2 diabetes poorly controlled most recent A1c 9  P: Adjust SSI Add basal insulin   Heart healthy diet with Carb modifications   Hypertension  Hyperlipidemia Off levophed  P: Continue to hold statin while on remdesivir   Hold antihypertensives for now, restart when appropriate   CAD HFpEF Atrial fibrillation on Eliquis  Sick sinus syndrome s/p leadless PPM Aortic stenosis s/p TAVR P: Continue heparin  gtt Echo results pending, follow up  Cards following, appreciate assistance and recs    Best Practice (right click and Reselect all SmartList Selections daily)   Diet/type: NPO DVT prophylaxis:  heparin  gtt  Pressure ulcer(s): Left foot wound, POA GI prophylaxis: N/A Lines: N/A Foley:  Yes, and it is still needed Code Status:  full code Last date of multidisciplinary goals of care discussion [updated wife and patient at bedside on plan of care.  Admit to ICU.  Recheck potassium and will likely need HD cath if temporization is unsuccessful.] Critical care time: 48   Fonda JAYSON Sharps, NP [facility] 12/07/23 7:55 AM  Please see [email or web address] for pager details.  From 7A-7P if no response, please call  [phone] After hours, please call ELink [phone]

## 2023-12-07 NOTE — Consult Note (Signed)
 Cardiology Admission History and Physical:   Patient ID: Shane [name] MRN: [id number]; DOB: 26-Mar-1941   Admission date: 12/06/2023  Primary Care Provider: Shepard Ade, MD Primary Cardiologist: Redell Shallow, MD  Primary Electrophysiologist:  Danelle Birmingham, MD  Chief Complaint:  URI Symptoms   Patient Profile:   Shane [name] is a 83 y.o. 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 coronary artery disease with prior CABG, sick sinus syndrome with leadless PPM, OSA who presents for COVID-10 infection and found to have elevated troponin.   History of Present Illness:   Shane [name] states that he has had dyspnea and chills while at home. Given his symptoms he came into the ED to be evaluated. While in the ED he tested positive for COVID 19. He did not require additional supplemental oxygen. He was started on antivirals for coronavirus. Labs demonstrated elevated troponin [phone] in the setting of a creatine of 5.23 Shane [name] denies chest pain or chest pressure.   Past Medical History:  Diagnosis Date   Acute blood loss anemia    Anxiety    AV block, Mobitz 1    Cataract    Chronic kidney disease    d/t DM   CKD (chronic kidney disease), stage III (HCC)    COVID-19 virus infection 07/18/2020   Last Assessment & Plan:   Formatting of this note might be different from the original.  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 10/2019 Echo: EF 55-60%, Gr2 DD. Nl RV.     Neuromuscular disorder (HCC)    Neuropathy associated with endocrine disorder (HCC)    Nonobstructive CAD (coronary artery disease)    a. 2012 Cath: mod, nonobs dzs; b. 10/2016 MV: EF 60%, no ischemia.   OSA on CPAP 05/05/2017   Unattended Home Sleep Test 7/2/813-AHI 38.6/hour, desaturation to 64%, body weight 261 pounds   PONV (postoperative nausea and vomiting)    Postoperative anemia due to acute blood loss 11/07/2020   Postoperative hemorrhagic shock 03/20/2021   Sleep apnea     uses cpap asked to bring mask and tubing    Past Surgical History:  Procedure Laterality Date   ABDOMINAL AORTOGRAM W/LOWER EXTREMITY N/A 12/10/2020   Procedure: ABDOMINAL AORTOGRAM W/LOWER EXTREMITY;  Surgeon: Magda Debby SAILOR, MD;  Location: [facility] INVASIVE CV LAB;  Service: Cardiovascular;  Laterality: N/A;   AMPUTATION Right 01/22/2021   Procedure: 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;  Surgeon: Harden Jerona GAILS, MD;  Location: [facility] OR;  Service: Orthopedics;  Laterality: Right;   AMPUTATION Right 03/17/2021   Procedure: RIGHT BELOW KNEE AMPUTATION;  Surgeon: Harden Jerona GAILS, MD;  Location: [facility] OR;  Service: Orthopedics;  Laterality: Right;   ANKLE FUSION Right 01/22/2021   Procedure: RIGHT FOOT TIBIOCALCANEAL FUSION;  Surgeon: Harden Jerona GAILS, MD;  Location: [facility] OR;  Service: Orthopedics;  Laterality: Right;   ANTERIOR FUSION CERVICAL SPINE  2012   CARDIAC CATHETERIZATION  2011   CARDIAC CATHETERIZATION N/A 11/09/2016   Procedure: Right Heart Cath;  Surgeon: Victory LELON Sharps, MD;  Location: [facility] INVASIVE CV LAB;  Service: Cardiovascular;  Laterality: N/A;   carpel tunnel     left wrist   CATARACT EXTRACTION     CATARACT EXTRACTION W/ INTRAOCULAR LENS  IMPLANT, BILATERAL  2013   CERVICAL LAMINECTOMY  2012   COLONOSCOPY N/A 01/14/2013   Procedure: COLONOSCOPY;  Surgeon: Norleen LOISE Kiang, MD;  Location: [facility] ENDOSCOPY;  Service: Endoscopy;  Laterality: N/A;   CORONARY ARTERY BYPASS GRAFT  11/04/2020   LIMA-LAD, SVG-OM1, SVG-PDA (Dr Norleen Exon [facility]) dc 11/18/2020   EYE SURGERY     I & D EXTREMITY Right 02/19/2021   Procedure: RIGHT ANKLE DEBRIDEMENT AND PLACEMENT ANTIBIOTIC BEADS;  Surgeon: Harden Jerona GAILS, MD;  Location: [facility] OR;  Service: Orthopedics;  Laterality: Right;   KNEE SURGERY  1998   left   LEFT HEART CATH AND CORONARY ANGIOGRAPHY N/A 07/10/2020   Procedure: LEFT HEART CATH AND CORONARY ANGIOGRAPHY;  Surgeon: Wonda Sharper, MD;  Location: [facility] INVASIVE CV LAB;  Service: Cardiovascular;  Laterality: N/A;   LUMBAR LAMINECTOMY  2003   LUNG BIOPSY Left 12/26/2016   Procedure: LUNG BIOPSY;  Surgeon: Elspeth JAYSON Millers, MD;  Location: [facility] OR;  Service: Thoracic;  Laterality: Left;   PACEMAKER IMPLANT N/A 03/30/2020   Procedure: PACEMAKER IMPLANT;  Surgeon: Waddell Danelle LELON, MD;  Location: [facility] INVASIVE CV LAB;  Service: Cardiovascular;  Laterality: N/A;   PERIPHERAL VASCULAR INTERVENTION Right 12/10/2020   Procedure: PERIPHERAL VASCULAR INTERVENTION;  Surgeon: Magda Debby LOISE, MD;  Location: [facility] CV LAB;  Service: Cardiovascular;  Laterality: Right;  SFA   POSTERIOR FUSION CERVICAL SPINE  2012   PPM GENERATOR REMOVAL N/A 12/14/2020   Procedure: PPM GENERATOR REMOVAL;  Surgeon: Waddell Danelle LELON, MD;  Location: [facility] INVASIVE CV LAB;  Service: Cardiovascular;  Laterality: N/A;   TEE WITHOUT CARDIOVERSION N/A 12/11/2020   Procedure: TRANSESOPHAGEAL ECHOCARDIOGRAM (TEE);  Surgeon: Barbaraann Darryle Debby, MD;  Location: [facility] ENDOSCOPY;  Service: Cardiovascular;  Laterality: N/A;   TRIGGER FINGER RELEASE  2011   4th finger left hand   VIDEO ASSISTED THORACOSCOPY Left 12/26/2016   Procedure: VIDEO ASSISTED THORACOSCOPY;  Surgeon: Elspeth JAYSON Millers, MD;  Location: [facility] OR;  Service: Thoracic;  Laterality: Left;   VIDEO BRONCHOSCOPY N/A 12/26/2016   Procedure: VIDEO BRONCHOSCOPY;  Surgeon: Elspeth JAYSON Millers, MD;  Location: [facility] OR;  Service: Thoracic;  Laterality: N/A;     Medications Prior to Admission: Prior to Admission medications    Medication Sig Start Date End Date Taking? Authorizing Provider  albuterol  (PROVENTIL ) (2.5 MG/3ML) 0.083% nebulizer solution Take 2.5 mg by nebulization 2 (two) times daily as needed for wheezing or shortness of breath.   Yes [provider]  albuterol  (VENTOLIN  HFA) 108 (90 Base) MCG/ACT inhaler Inhale 2 puffs into the lungs every 4 (four) hours as needed for wheezing or shortness of breath. Patient taking differently: Inhale 3 puffs into the lungs every 4 (four) hours as needed for wheezing or shortness of breath. 04/12/21  Yes Angiulli, Toribio PARAS, PA-C  apixaban  (ELIQUIS ) 5 MG TABS tablet Take 1 tablet (5 mg total) by mouth 2 (two) times daily. Patient taking differently: Take 2.5 mg by mouth 2 (two) times daily. 04/12/21  Yes Angiulli, Toribio PARAS, PA-C  Ascorbic Acid  (VITAMIN C  WITH ROSE HIPS) 500 MG tablet Take 1 tablet (500 mg total) by mouth daily. 04/12/21  Yes Pegge Toribio  J, PA-C  atorvastatin  (LIPITOR ) 80 MG tablet TAKE 1 TABLET BY MOUTH EVERY DAY Patient taking differently: Take 80 mg by mouth at bedtime. 07/25/22  Yes Pietro Redell RAMAN, MD  clopidogrel  (PLAVIX ) 75 MG tablet Take 75 mg by mouth daily. 09/04/22  Yes [provider]  diphenhydrAMINE  HCl, Sleep, (ZZZQUIL PO) Take 15 mLs by mouth at bedtime as needed (cough).   Yes [provider]  docusate sodium  (COLACE) 100 MG capsule Take 1 capsule (100 mg total) by mouth daily. Patient taking differently: Take 100 mg by mouth as needed for mild constipation or moderate constipation. 04/13/21  Yes Angiulli, Toribio PARAS, PA-C  escitalopram  (LEXAPRO ) 10 MG tablet Take 1 tablet (10 mg total) by mouth daily. 04/12/21  Yes Angiulli, Toribio PARAS, PA-C  FARXIGA  10 MG TABS tablet Take 10 mg by mouth daily.   Yes [provider]  furosemide  (LASIX ) 20 MG tablet Take 3 tablets (60 mg total) by mouth daily. Patient taking differently: Take 20-40 mg by mouth See admin instructions. Take 2 tablets by mouth in the morning and 1  tablet at night 06/24/23 12/05/24 Yes Ezenduka, Nkeiruka J, MD  HUMALOG  KWIKPEN 100 UNIT/ML KwikPen Inject 10-15 Units into the skin 3 (three) times daily. Depending what his blood sugar reading is   Yes [provider]  HYDROMET 5-1.5 MG/5ML syrup Take 5 mLs by mouth as needed for cough. 05/29/23  Yes [provider]  lisinopril  (ZESTRIL ) 10 MG tablet Take 10 mg by mouth daily. 08/22/23  Yes [provider]  LORazepam  (ATIVAN ) 1 MG tablet Take 1 tablet (1 mg total) by mouth at bedtime. Patient taking differently: Take 1 mg by mouth at bedtime as needed (sleep). 04/12/21  Yes Angiulli, Toribio PARAS, PA-C  metoprolol  tartrate (LOPRESSOR ) 25 MG tablet Take 25 mg by mouth daily.   Yes [provider]  Multiple Vitamin (MULTIVITAMIN WITH MINERALS) TABS tablet Take 1 tablet by mouth daily.   Yes [provider]  mupirocin  ointment (BACTROBAN ) 2 % Apply 1 Application topically at bedtime. 06/06/23  Yes [provider]  nitroGLYCERIN  (NITRODUR - DOSED IN MG/24 HR) 0.2 mg/hr patch Place 1 patch (0.2 mg total) onto the skin daily. 11/23/23  Yes Harden Jerona GAILS, MD  Olopatadine HCl (PATADAY OP) Place 2 drops into both eyes daily as needed (allergies).   Yes [provider]  ondansetron  (ZOFRAN ) 4 MG tablet Take 4 mg by mouth as needed for nausea or vomiting. 06/15/23  Yes [provider]  pantoprazole  (PROTONIX ) 40 MG tablet Take 1 tablet (40 mg total) by mouth 2 (two) times daily. Patient taking differently: Take 40 mg by mouth daily. 04/12/21  Yes Angiulli, Daniel J, PA-C  polyethylene glycol powder (GLYCOLAX /MIRALAX ) 17 GM/SCOOP powder Take 17 g by mouth every other day. 11/15/20  Yes [provider]  traZODone  (DESYREL ) 50 MG tablet Take 75 mg by mouth at bedtime as needed for sleep. 05/11/23  Yes [provider]  TRESIBA  FLEXTOUCH 100 UNIT/ML FlexTouch Pen Inject 20 Units into the skin daily. 12/22/22  Yes [provider]   zinc  sulfate 220 (50 Zn) MG capsule Take 1 capsule (220 mg total) by mouth daily. 04/12/21  Yes Angiulli, Toribio PARAS, PA-C  gabapentin  (NEURONTIN ) 300 MG capsule TAKE 1 CAPSULE BY MOUTH THREE TIMES A DAY Patient taking differently: Take 300 mg by mouth 2 (two) times daily. 12/14/21   Valdemar Rocky SAUNDERS, NP     Allergies:    Allergies  Allergen Reactions  Codeine Hives and Itching   Ofev  [Nintedanib ] Diarrhea    SEVERE DIARRHEA   Pirfenidone  Diarrhea and Other (See Comments)    Esbriet  (Pirfenidone ) causes elevated LFTs. 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    Social Drivers of Corporate Investment Banker Strain: Low Risk  (08/22/2023)   Received from [facility]   Overall Financial Resource Strain (CARDIA)    Difficulty of Paying Living Expenses: Not hard at all  Food Insecurity: No Food Insecurity (08/22/2023)   Received from [facility]   Hunger Vital Sign    Worried About Running Out of Food in the Last Year: Never true    Ran Out of Food in the Last Year: Never true  Transportation Needs: No Transportation Needs (08/22/2023)   Received from [facility] - Transportation    In the past 12 months, has lack of transportation kept you from medical appointments or from getting medications?: No    Lack of Transportation (Non-Medical): No  Physical Activity: Not on file   Stress: Not on file  Social Connections: Not on file  Intimate Partner Violence: Not At Risk (06/21/2023)   Humiliation, Afraid, Rape, and Kick questionnaire    Fear of Current or Ex-Partner: No    Emotionally Abused: No    Physically Abused: No    Sexually Abused: No    Family History:   The patient's family history includes Diabetes Mellitus II in his mother; Emphysema (age of onset: 83) in his father; Heart attack in his father. There is no history of Colon cancer, Esophageal cancer, Rectal cancer, or Stomach cancer.    Review of Systems: [y] = yes, [ ]  = no    General: Weight gain [ ] ; Weight loss [ ] ; Anorexia [ ] ; Fatigue [ ] ; Fever [ ] ; Chills [ ] ; Weakness [ ]   Cardiac: Chest pain/pressure [ ] ; Resting SOB [ ] ; Exertional SOB [ ] ; Orthopnea [ ] ; Pedal Edema [ ] ; Palpitations [ ] ; Syncope [ ] ; Presyncope [ ] ; Paroxysmal nocturnal dyspnea[ ]   Pulmonary: Cough davis.dad ]; Wheezing[ y]; Hemoptysis[ ] ; Sputum [ ] ; Snoring [ ]   GI: Vomiting[ ] ; Dysphagia[ ] ; Melena[ ] ; Hematochezia [ ] ; Heartburn[ ] ; Abdominal pain [ ] ; Constipation [ ] ; Diarrhea [ ] ; BRBPR [ ]   GU: Hematuria[ ] ; Dysuria [ ] ; Nocturia[ ]   Vascular: Pain in legs with walking [ ] ; Pain in feet with lying flat [ ] ; Non-healing sores [ ] ; Stroke [ ] ; TIA [ ] ; Slurred speech [ ] ;  Neuro: Headaches[ ] ; Vertigo[ ] ; Seizures[ ] ; Paresthesias[ ] ;Blurred vision [ ] ; Diplopia [ ] ; Vision changes [ ]   Ortho/Skin: Arthritis [ ] ; Joint pain [ ] ; Muscle pain [ ] ; Joint swelling [ ] ; Back Pain [ ] ;  Rash [ ]   Psych: Depression[ ] ; Anxiety[ ]   Heme: Bleeding problems [ ] ; Clotting disorders [ ] ; Anemia [ ]   Endocrine: Diabetes [ ] ; Thyroid dysfunction[ ]   Physical Exam/Data:   Vitals:   12/07/23 0357 12/07/23 0400 12/07/23 0415 12/07/23 0419  BP:  (!) 110/54 (!) 120/57 (!) 116/55  Pulse:  60  60  Resp:  16 15 14   Temp: 98.2 F (36.8 C)     TempSrc: Axillary     SpO2:  98%  99%  Weight:      Height:        Intake/Output Summary  (Last 24 hours) at 12/07/2023 0425 Last data filed at 12/07/2023 0400 Gross per 24 hour  Intake 776.41 ml  Output 620 ml  Net 156.41 ml   Filed Weights   12/06/23 1805 12/07/23 0000 12/07/23 0344  Weight: 102 kg 109.4 kg 109.4 kg   Body mass index is 32.71 kg/m.  General:  Well nourished, well developed, in no acute distress HEENT: normal Lymph: no adenopathy Neck: no JVD Endocrine:  No thryomegaly Vascular: No carotid bruits; FA pulses 2+ bilaterally without bruits  Cardiac:  normal S1, S2; RRR; no murmur  Lungs:  + rales Abd: soft, nontender, no hepatomegaly  Ext: Right leg amputation Musculoskeletal:  No deformities, BUE and BLE strength normal and equal Skin: warm and dry  Psych:  Normal affect    EKG:  The ECG that was done 12/06/2023 at 7pm  was personally reviewed and demonstrates paced rhythm   Relevant CV Studies:  Laboratory Data:  Chemistry Recent Labs  Lab 12/06/23 2020 12/06/23 2310 12/07/23 0116  NA 129*  --  128*  K >7.5* 6.2* 6.3*  CL 96*  --  94*  CO2 18*  --  15*  GLUCOSE 165*  --  282*  BUN 118*  --  122*  CREATININE 4.79* 5.16* 5.29*  CALCIUM  8.3*  --  8.2*  GFRNONAA 11* 10* 10*  ANIONGAP 15  --  19*    Recent Labs  Lab 12/06/23 1840  PROT 6.8  ALBUMIN  2.9*  AST 45*  ALT 16  ALKPHOS 85  BILITOT 0.6   Hematology Recent Labs  Lab 12/06/23 2311 12/07/23 0116  WBC 8.7 7.7  RBC 3.27* 3.42*  HGB 8.9* 9.1*  HCT 28.0* 28.6*  MCV 85.6 83.6  MCH 27.2 26.6  MCHC 31.8 31.8  RDW 18.8* 18.9*  PLT 161 175   Cardiac EnzymesNo results for input(s): TROPONINI in the last 168 hours. No results for input(s): TROPIPOC in the last 168 hours.  BNP Recent Labs  Lab 12/06/23 2310  BNP 480.6*    DDimer No results for input(s): DDIMER in the last 168 hours.  Radiology/Studies:  DG CHEST PORT 1 VIEW Result Date: 12/07/2023 CLINICAL DATA:  Central line placement. EXAM: PORTABLE CHEST 1 VIEW COMPARISON:  Radiograph yesterday. FINDINGS: Right  internal jugular central venous catheter tip overlies the upper SVC. No pneumothorax. Median sternotomy. Stable heart size and mediastinal contours. TAVR and lead less pacemaker. Pulmonary edema and small pleural effusions. IMPRESSION: 1. Right internal jugular central venous catheter tip overlies the upper SVC. No pneumothorax. 2. Pulmonary edema and small pleural effusions. Electronically Signed   By: Andrea Gasman M.D.   On: 12/07/2023 03:41   US  RENAL Result Date: 12/06/2023 CLINICAL DATA:  Acute renal insufficiency EXAM: RENAL / URINARY TRACT ULTRASOUND COMPLETE COMPARISON:  04/27/2018 FINDINGS: Right Kidney: Renal measurements: 11.8 x 5.2 x 4.3 cm = volume: 137.2 mL.  Echogenicity within normal limits. No mass or hydronephrosis visualized. Left Kidney: Renal measurements: 11.7 x 5.6 x 4.5 cm = volume: 154.4 mL. Echogenicity within normal limits. No mass or hydronephrosis visualized. Bladder: Appears normal for degree of bladder distention. Other: None. IMPRESSION: 1. Unremarkable renal ultrasound. Electronically Signed   By: Ozell Daring M.D.   On: 12/06/2023 22:10   DG Chest Port 1 View Result Date: 12/06/2023 CLINICAL DATA:  SOB. EXAM: PORTABLE CHEST - 1 VIEW COMPARISON:  06/21/2023. FINDINGS: Cardiac silhouette is prominent. There is pulmonary interstitial prominence with vascular congestion. No focal consolidation. No pneumothorax. There are bilateral pleural effusions. 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 IMPRESSION: Findings suggest CHF. Electronically Signed   By: Fonda Field M.D.   On: 12/06/2023 19:13    ECHO 06/2023 1. Left ventricular ejection fraction, by estimation, is 50 to 55%. The  left ventricle has low normal function. Left ventricular endocardial  border not optimally defined to evaluate regional wall motion. There is  mild concentric left ventricular  hypertrophy. Left ventricular diastolic parameters are consistent with  Grade III  diastolic dysfunction (restrictive).   2. Right ventricular systolic function is moderately reduced. The right  ventricular size is not well visualized. Tricuspid regurgitation signal is  inadequate for assessing PA pressure.   3. Left atrial size was severely dilated.   4. Right atrial size was moderately dilated.   5. The mitral valve is normal in structure. Trivial mitral valve  regurgitation.   6. The aortic valve is tricuspid. There is severe calcifcation of the  aortic valve. Aortic valve regurgitation is not visualized. Moderate to  severe aortic valve stenosis. Aortic valve area, by VTI measures 0.91 cm.  Aortic valve mean gradient measures  33.0 mmHg. Aortic valve Vmax measures 3.62 m/s.   7. Aortic dilatation noted. There is mild dilatation of the ascending  aorta, measuring 42 mm. There is borderline dilatation of the aortic root,  measuring 38 mm.   8. The inferior vena cava is dilated in size with <50% respiratory  variability, suggesting right atrial pressure of 15 mmHg.   Assessment and Plan:   Elevated Troponin  Shane [name] presents with elevated troponin in the setting of COVID-19 infection. It is currently unclear if he is having ischemia (demand vs NSTEM) or if he is experiencing myocardial injury in the setting of COVID-19 infection and CKD. The evidence available currently does not suggest ischemia as he does not have chest pain, and his EKG demonstrates a paced rhythm. However, would recommend continued evaluation. Would recommend an ECHO to evaluate for wall motion abnormalities. His prior ECHO in July 2024 was normal and would serve as a good comparison. He could very well have an NSTEMI as he has a prior CABG (2021, grafts not evaluated prior to 2024 TAVR) or demand ischemia. - Obtain an ECHO  - Okay to heparin  for now while we evaluate.   For questions or updates, please contact [facility] Please consult [email or web address] for contact info under         Signed, Merlene JAYSON Blood, MD  12/07/2023 4:25 AM

## 2023-12-07 NOTE — Progress Notes (Addendum)
 PHARMACY - ANTICOAGULATION CONSULT NOTE  Pharmacy Consult for heparin  Indication:  concern for ACS and h/o Afib  Allergies  Allergen Reactions   Codeine Hives and Itching   Ofev  [Nintedanib ] Diarrhea    SEVERE DIARRHEA   Pirfenidone  Diarrhea and Other (See Comments)    Esbriet  (Pirfenidone ) causes elevated LFTs. D/C on 06/14/17 and SEVERE DIARRHEA     Patient Measurements: Height: 6' (182.9 cm) Weight: 109.4 kg (241 lb 2.9 oz) IBW/kg (Calculated) : 77.6 Heparin  Dosing Weight: 100kg  Vital Signs: Temp: 98 F (36.7 C) (01/02 0800) Temp Source: Oral (01/02 0800) BP: 92/52 (01/02 1245) Pulse Rate: 65 (01/02 1245)  Labs: Recent Labs    12/06/23 1840 12/06/23 2020 12/06/23 2128 12/06/23 2310 12/06/23 2311 12/07/23 0116 12/07/23 0405 12/07/23 0500 12/07/23 0949  HGB 9.5*  --   --   --  8.9* 9.1*  --   --   --   HCT 30.1*  --   --   --  28.0* 28.6*  --   --   --   PLT 192  --   --   --  161 175  --   --   --   APTT 37*  --   --   --   --   --  153*  --  >200*  LABPROT 18.8*  --   --   --   --   --   --   --   --   INR 1.6*  --   --   --   --   --   --   --   --   CREATININE 4.69*   < >  --  5.16*  --  5.29*  --  5.23*  --   TROPONINIHS  --   --  8,290*  --  7,625*  --   --   --   --    < > = values in this interval not displayed.    Estimated Creatinine Clearance: 13.9 mL/min (A) (by C-G formula based on SCr of 5.23 mg/dL (H)).   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 10/2019 Echo: EF 55-60%, Gr2 DD. Nl RV.    Neuromuscular disorder (HCC)    Neuropathy associated with endocrine disorder (HCC)    Nonobstructive CAD (coronary artery disease)    a. 2012 Cath: mod, nonobs dzs; b. 10/2016 MV: EF 60%, no ischemia.   OSA on CPAP 05/05/2017   Unattended Home Sleep Test 7/2/813-AHI 38.6/hour, desaturation to  64%, body weight 261 pounds   PONV (postoperative nausea and vomiting)    Postoperative anemia due to acute blood loss 11/07/2020   Postoperative hemorrhagic shock 03/20/2021   Sleep apnea     uses cpap asked to bring mask and tubing    Medications:  Medications Prior to Admission  Medication Sig Dispense Refill Last Dose/Taking   albuterol  (PROVENTIL ) (2.5 MG/3ML) 0.083% nebulizer solution Take 2.5 mg by nebulization 2 (two) times daily as needed for wheezing or shortness of breath.   12/06/2023 Noon   albuterol  (VENTOLIN  HFA) 108 (90 Base) MCG/ACT inhaler Inhale 2 puffs into the lungs every 4 (four) hours as needed for wheezing or shortness of breath. (Patient taking differently: Inhale 3 puffs into the lungs every 4 (four) hours as needed for wheezing or shortness of breath.) 6.7 g 0 12/06/2023 Evening   apixaban  (ELIQUIS ) 5 MG TABS tablet Take 1 tablet (5 mg total) by mouth 2 (two) times daily. (Patient taking differently: Take 2.5 mg by mouth 2 (two) times daily.) 60 tablet 11 12/05/2023 at  8:00 PM   Ascorbic Acid  (VITAMIN C  WITH ROSE HIPS) 500 MG tablet Take 1 tablet (500 mg total) by mouth daily. 30 tablet 0 12/05/2023 Bedtime   atorvastatin  (LIPITOR ) 80 MG tablet TAKE 1 TABLET BY MOUTH EVERY DAY (Patient taking differently: Take 80 mg by mouth at bedtime.)  90 tablet 3 12/05/2023 Bedtime   clopidogrel  (PLAVIX ) 75 MG tablet Take 75 mg by mouth daily.   12/05/2023 Morning   diphenhydrAMINE  HCl, Sleep, (ZZZQUIL PO) Take 15 mLs by mouth at bedtime as needed (cough).   Past Week   docusate sodium  (COLACE) 100 MG capsule Take 1 capsule (100 mg total) by mouth daily. (Patient taking differently: Take 100 mg by mouth as needed for mild constipation or moderate constipation.) 10 capsule 0 Past Week   escitalopram  (LEXAPRO ) 10 MG tablet Take 1 tablet (10 mg total) by mouth daily. 30 tablet 0 12/05/2023 Morning   FARXIGA  10 MG TABS tablet Take 10 mg by mouth daily.   12/05/2023 Morning   furosemide  (LASIX ) 20 MG tablet Take 3 tablets (60 mg total) by mouth daily. (Patient taking differently: Take 20-40 mg by mouth See admin instructions. Take 2 tablets by mouth in the morning and 1 tablet at night) 90 tablet 0 12/06/2023 Morning   HUMALOG  KWIKPEN 100 UNIT/ML KwikPen Inject 10-15 Units into the skin 3 (three) times daily. Depending what his blood sugar reading is   12/05/2023 Evening   HYDROMET 5-1.5 MG/5ML syrup Take 5 mLs by mouth as needed for cough.   12/05/2023   lisinopril  (ZESTRIL ) 10 MG tablet Take 10 mg by mouth daily.   12/05/2023 Morning   LORazepam  (ATIVAN ) 1 MG tablet Take 1 tablet (1 mg total) by mouth at bedtime. (Patient taking differently: Take 1 mg by mouth at bedtime as needed (sleep).) 30 tablet 0 Past Week   metoprolol  tartrate (LOPRESSOR ) 25 MG tablet Take 25 mg by mouth daily.   12/05/2023 Morning   Multiple Vitamin (MULTIVITAMIN WITH MINERALS) TABS tablet Take 1 tablet by mouth daily.   12/05/2023 Morning   mupirocin  ointment (BACTROBAN ) 2 % Apply 1 Application topically at bedtime.   Past Week   nitroGLYCERIN  (NITRODUR - DOSED IN MG/24 HR) 0.2 mg/hr patch Place 1 patch (0.2 mg total) onto the skin daily. 30 patch 12 12/06/2023 Bedtime   Olopatadine HCl (PATADAY OP) Place 2 drops into both eyes daily as needed (allergies).   Past Week  ondansetron  (ZOFRAN ) 4 MG tablet Take 4 mg by mouth as needed for nausea or vomiting.   Past Week   pantoprazole  (PROTONIX ) 40 MG tablet Take 1 tablet (40 mg total) by mouth 2 (two) times daily. (Patient taking differently: Take 40 mg by mouth daily.) 60 tablet 0 12/05/2023 Morning   polyethylene glycol powder (GLYCOLAX /MIRALAX ) 17 GM/SCOOP powder Take 17 g by mouth every other day.   Past Month   traZODone  (DESYREL ) 50 MG tablet Take 75 mg by mouth at bedtime as needed for sleep.   12/05/2023 Bedtime   TRESIBA  FLEXTOUCH 100 UNIT/ML FlexTouch Pen Inject 20 Units into the skin daily.   12/05/2023 Morning   zinc  sulfate 220 (50 Zn) MG capsule Take 1 capsule (220 mg total) by mouth daily. 30 capsule 0 12/05/2023   gabapentin  (NEURONTIN ) 300 MG capsule TAKE 1 CAPSULE BY MOUTH THREE TIMES A DAY (Patient taking differently: Take 300 mg by mouth 2 (two) times daily.) 180 capsule 3    Scheduled:   atorvastatin   80 mg Oral Daily   Chlorhexidine  Gluconate Cloth  6 each Topical Daily   clopidogrel   75 mg Oral Daily   dexamethasone  (DECADRON ) injection  6 mg Intravenous Q24H   docusate sodium   100 mg Oral BID   escitalopram   10 mg Oral Daily   insulin  aspart  0-20 Units Subcutaneous TID WC   insulin  aspart  0-5 Units Subcutaneous QHS   insulin  glargine-yfgn  10 Units Subcutaneous Daily   pantoprazole   40 mg Oral Daily   Infusions:   sodium chloride  Stopped (12/07/23 0008)   heparin      norepinephrine  (LEVOPHED ) Adult infusion Stopped (12/07/23 0805)   prismasol  BGK 2/2.5 dialysis solution 1,500 mL/hr at 12/07/23 0947   prismasol  BGK 2/2.5 replacement solution 400 mL/hr at 12/07/23 0949   prismasol  BGK 2/2.5 replacement solution 400 mL/hr at 12/07/23 0950   remdesivir  100 mg in sodium chloride  0.9 % 100 mL IVPB Stopped (12/07/23 0958)    Assessment: 82yo male c/o SOB, found to be Covid+, troponin also elevated >> concern for NSTEMI, to transition from apixaban  for Afib to heparin ; last dose of  apixaban  was taken 12/31 pm.  Current on Heparin  IV at 1500 units/hr.  aPTT >200; stat repeat still in process.  Initially no bleeding noted - however on 1PM recheck of patient, noted to have bright red blood per IV above where heparin  is running.   Goal of Therapy:  Heparin  level 0.3-0.7 units/ml aPTT 66-102 seconds Monitor platelets by anticoagulation protocol: Yes   Plan:  Stop IV heparin  for 1.5 hours.  Monitor further bleeding and if bleeding resolved, then resume at lower rate of 1200 units/hr.  Monitor heparin  levels, aPTT (while DOAC affects anti-Xa assay), and CBC.  Addendum: Repeat aPTT just called back and >200. Continue with prior plan of holding and reducing. 1:16 PM, 12/07/2023    Harlene Boga, PharmD, BCPS, BCCCP Please refer to [facility] for [facility] Pharmacy numbers 12/07/2023,1:08 PM

## 2023-12-07 NOTE — Evaluation (Signed)
 Clinical/Bedside Swallow Evaluation Patient Details  Name: Shane [name] MRN: [id number] Date of Birth: [birth year]  Today's Date: 12/07/2023 Time: SLP Start Time (ACUTE ONLY): 1255 SLP Stop Time (ACUTE ONLY): 1315 SLP Time Calculation (min) (ACUTE ONLY): 20 min  Past Medical History:  Past Medical History:  Diagnosis Date   Acute blood loss anemia    Anxiety    AV block, Mobitz 1    Cataract    Chronic kidney disease    d/t DM   CKD (chronic kidney disease), stage III (HCC)    COVID-19 virus infection 07/18/2020   Last Assessment & Plan:   Formatting of this note might be different from the original.  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 10/2019 Echo: EF 55-60%, Gr2 DD. Nl RV.    Neuromuscular disorder (HCC)    Neuropathy associated with endocrine disorder (HCC)    Nonobstructive CAD (coronary artery disease)    a. 2012 Cath: mod, nonobs dzs; b. 10/2016 MV: EF 60%, no ischemia.   OSA on CPAP 05/05/2017   Unattended Home Sleep Test 7/2/813-AHI 38.6/hour, desaturation to 64%, body weight 261 pounds   PONV (postoperative nausea and vomiting)    Postoperative anemia due to acute blood loss 11/07/2020   Postoperative hemorrhagic shock 03/20/2021   Sleep apnea     uses cpap asked to bring mask and tubing   Past Surgical History:  Past Surgical History:  Procedure Laterality Date   ABDOMINAL AORTOGRAM W/LOWER EXTREMITY N/A 12/10/2020   Procedure: ABDOMINAL AORTOGRAM W/LOWER EXTREMITY;  Surgeon: Magda Debby SAILOR, MD;   Location: [facility] INVASIVE CV LAB;  Service: Cardiovascular;  Laterality: N/A;   AMPUTATION Right 01/22/2021   Procedure: 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;  Surgeon: Harden Jerona GAILS, MD;  Location: [facility] OR;  Service: Orthopedics;  Laterality: Right;   AMPUTATION Right 03/17/2021   Procedure: RIGHT BELOW KNEE AMPUTATION;  Surgeon: Harden Jerona GAILS, MD;  Location: [facility] OR;  Service: Orthopedics;  Laterality: Right;   ANKLE FUSION Right 01/22/2021   Procedure: RIGHT FOOT TIBIOCALCANEAL FUSION;  Surgeon: Harden Jerona GAILS, MD;  Location: [facility] OR;  Service: Orthopedics;  Laterality: Right;   ANTERIOR FUSION CERVICAL SPINE  2012   CARDIAC CATHETERIZATION  2011   CARDIAC CATHETERIZATION N/A 11/09/2016   Procedure: Right Heart Cath;  Surgeon: Victory LELON Sharps, MD;  Location: [facility] INVASIVE CV LAB;  Service: Cardiovascular;  Laterality: N/A;   carpel tunnel     left wrist   CATARACT EXTRACTION     CATARACT EXTRACTION W/ INTRAOCULAR LENS  IMPLANT, BILATERAL  2013   CERVICAL LAMINECTOMY  2012   COLONOSCOPY N/A 01/14/2013   Procedure: COLONOSCOPY;  Surgeon: Norleen SAILOR Kiang, MD;  Location: [facility] ENDOSCOPY;  Service: Endoscopy;  Laterality: N/A;   CORONARY ARTERY BYPASS GRAFT  11/04/2020   LIMA-LAD, SVG-OM1, SVG-PDA (Dr Norleen Exon [facility]) dc 11/18/2020   EYE SURGERY     I & D EXTREMITY Right 02/19/2021   Procedure: RIGHT  ANKLE DEBRIDEMENT AND PLACEMENT ANTIBIOTIC BEADS;  Surgeon: Harden Jerona GAILS, MD;  Location: [facility] OR;  Service: Orthopedics;  Laterality: Right;   KNEE SURGERY  1998   left   LEFT HEART CATH AND CORONARY ANGIOGRAPHY N/A 07/10/2020   Procedure: LEFT HEART CATH AND CORONARY ANGIOGRAPHY;  Surgeon: Wonda Sharper, MD;  Location: [facility] INVASIVE CV LAB;  Service: Cardiovascular;  Laterality: N/A;   LUMBAR LAMINECTOMY  2003   LUNG BIOPSY Left 12/26/2016   Procedure: LUNG BIOPSY;  Surgeon: Elspeth JAYSON Millers, MD;  Location: [facility] OR;  Service: Thoracic;  Laterality: Left;   PACEMAKER IMPLANT N/A 03/30/2020   Procedure: PACEMAKER IMPLANT;   Surgeon: Waddell Danelle ORN, MD;  Location: [facility] INVASIVE CV LAB;  Service: Cardiovascular;  Laterality: N/A;   PERIPHERAL VASCULAR INTERVENTION Right 12/10/2020   Procedure: PERIPHERAL VASCULAR INTERVENTION;  Surgeon: Magda Debby SAILOR, MD;  Location: [facility] INVASIVE CV LAB;  Service: Cardiovascular;  Laterality: Right;  SFA   POSTERIOR FUSION CERVICAL SPINE  2012   PPM GENERATOR REMOVAL N/A 12/14/2020   Procedure: PPM GENERATOR REMOVAL;  Surgeon: Waddell Danelle ORN, MD;  Location: [facility] INVASIVE CV LAB;  Service: Cardiovascular;  Laterality: N/A;   TEE WITHOUT CARDIOVERSION N/A 12/11/2020   Procedure: TRANSESOPHAGEAL ECHOCARDIOGRAM (TEE);  Surgeon: Barbaraann Darryle Debby, MD;  Location: [facility] ENDOSCOPY;  Service: Cardiovascular;  Laterality: N/A;   TRIGGER FINGER RELEASE  2011   4th finger left hand   VIDEO ASSISTED THORACOSCOPY Left 12/26/2016   Procedure: VIDEO ASSISTED THORACOSCOPY;  Surgeon: Elspeth JAYSON Millers, MD;  Location: [facility] OR;  Service: Thoracic;  Laterality: Left;   VIDEO BRONCHOSCOPY N/A 12/26/2016   Procedure: VIDEO BRONCHOSCOPY;  Surgeon: Elspeth JAYSON Millers, MD;  Location: [facility] OR;  Service: Thoracic;  Laterality: N/A;   HPI:  Shane [name] is an 83 yo male presenting to ED 1/1 with SOB. Admitted with AKI, CHF exacerbation, and PNA thought to be secondary to COVID 19. Previously seen by SLP in 2021 with a suspected esophageal dysphagia. PMH includes OSA on CPAP, CAD s/p CABG x3 2021, aortic stenosis s/p TAVR 08/2023, HFpEF, A-fib, sick sinus syndrome, HTN, HLD, GERD, uncontrolled DM, CKD 3, pulmonary fibrosis    Assessment / Plan / Recommendation  Clinical Impression  Pt reports extensive s/s of a suspected esophageal dysphagia including globus sensation, regurgitation, and gagging today and ongoing well before current admission. He states he avoids certain solids and that his symptoms are inconsistent but happen more frequently after eating solids. No s/s of oropharyngeal dysphagia noted throughout the  evaluation with thin liquids, purees, and solids. After taking two small bites of graham cracker, pt had multiple episodes of immediate regurgitation. Suspect his most significant risk of aspiration is post-prandial. Pt has good awareness of symptoms and appears to demonstrate good airway protection even in these episodes of regurgitation; however, would recommend further esophageal assessment while pt is admitted. Discussed with pt and his wife, RN, and MD. SLP will s/o at this time but please re-consult if needs arise following GI assessment. SLP Visit Diagnosis: Dysphagia, unspecified (R13.10)    Aspiration Risk  Mild aspiration risk    Diet Recommendation Regular;Thin liquid    Liquid Administration via: Cup;Straw Medication Administration: Whole meds with puree Supervision: Patient able to self feed Compensations: Slow rate;Small sips/bites Postural Changes: Seated upright at 90 degrees;Remain upright for at least 30 minutes after po intake    Other  Recommendations Recommended Consults: Consider GI evaluation;Consider esophageal assessment Oral Care Recommendations: Oral care BID  Recommendations for follow up therapy are one component of a multi-disciplinary discharge planning process, led by the attending physician.  Recommendations may be updated based on patient status, additional functional criteria and insurance authorization.  Follow up Recommendations No SLP follow up      Assistance Recommended at Discharge    Functional Status Assessment Patient has not had a recent decline in their functional status  Frequency and Duration            Prognosis Prognosis for improved oropharyngeal function: Good Barriers to Reach Goals: Time post onset      Swallow Study   General HPI: Shane [name] is an 83 yo male presenting to ED 1/1 with SOB. Admitted with AKI, CHF exacerbation, and PNA thought to be secondary to COVID 19. Previously seen by SLP in 2021 with a suspected  esophageal dysphagia. PMH includes OSA on CPAP, CAD s/p CABG x3 2021, aortic stenosis s/p TAVR 08/2023, HFpEF, A-fib, sick sinus syndrome, HTN, HLD, GERD, uncontrolled DM, CKD 3, pulmonary fibrosis Type of Study: Bedside Swallow Evaluation Previous Swallow Assessment: see HPI Diet Prior to this Study: Thin liquids (Level 0);Regular Temperature Spikes Noted: No Respiratory Status: Nasal cannula History of Recent Intubation: No Behavior/Cognition: Alert;Cooperative;Pleasant mood Oral Cavity Assessment: Within Functional Limits Oral Care Completed by SLP: No Oral Cavity - Dentition: Adequate natural dentition Vision: Functional for self-feeding Self-Feeding Abilities: Able to feed self Patient Positioning: Upright in bed Baseline Vocal Quality: Normal Volitional Cough: Strong Volitional Swallow: Able to elicit    Oral/Motor/Sensory Function Overall Oral Motor/Sensory Function: Within functional limits   Ice Chips Ice chips: Not tested   Thin Liquid Thin Liquid: Impaired Presentation: Straw;Self Fed Pharyngeal  Phase Impairments: Cough - Delayed    Nectar Thick Nectar Thick Liquid: Not tested   Honey Thick Honey Thick Liquid: Not tested   Puree Puree: Within functional limits Presentation: Spoon;Self Fed   Solid     Solid: Within functional limits Presentation: Self Fed      Damien Blumenthal, M.A., CF-SLP Speech Language Pathology, Acute Rehabilitation Services  Secure Chat preferred [phone]  12/07/2023,1:48 PM

## 2023-12-08 DIAGNOSIS — N179 Acute kidney failure, unspecified: Secondary | ICD-10-CM | POA: Diagnosis not present

## 2023-12-08 LAB — CBC
HCT: 27.3 % — ABNORMAL LOW (ref 39.0–52.0)
Hemoglobin: 9.1 g/dL — ABNORMAL LOW (ref 13.0–17.0)
MCH: 27.5 pg (ref 26.0–34.0)
MCHC: 33.3 g/dL (ref 30.0–36.0)
MCV: 82.5 fL (ref 80.0–100.0)
Platelets: 178 10*3/uL (ref 150–400)
RBC: 3.31 MIL/uL — ABNORMAL LOW (ref 4.22–5.81)
RDW: 19 % — ABNORMAL HIGH (ref 11.5–15.5)
WBC: 13.2 10*3/uL — ABNORMAL HIGH (ref 4.0–10.5)
nRBC: 0 % (ref 0.0–0.2)

## 2023-12-08 LAB — RENAL FUNCTION PANEL
Albumin: 2.7 g/dL — ABNORMAL LOW (ref 3.5–5.0)
Albumin: 2.8 g/dL — ABNORMAL LOW (ref 3.5–5.0)
Anion gap: 13 (ref 5–15)
Anion gap: 10 (ref 5–15)
BUN: 53 mg/dL — ABNORMAL HIGH (ref 8–23)
BUN: 41 mg/dL — ABNORMAL HIGH (ref 8–23)
CO2: 24 mmol/L (ref 22–32)
CO2: 26 mmol/L (ref 22–32)
Calcium: 8.5 mg/dL — ABNORMAL LOW (ref 8.9–10.3)
Calcium: 8.5 mg/dL — ABNORMAL LOW (ref 8.9–10.3)
Chloride: 97 mmol/L — ABNORMAL LOW (ref 98–111)
Chloride: 99 mmol/L (ref 98–111)
Creatinine, Ser: 1.66 mg/dL — ABNORMAL HIGH (ref 0.61–1.24)
Creatinine, Ser: 1.42 mg/dL — ABNORMAL HIGH (ref 0.61–1.24)
GFR, Estimated: 41 mL/min — ABNORMAL LOW (ref >60)
GFR, Estimated: 49 mL/min — ABNORMAL LOW (ref >60)
Glucose, Bld: 170 mg/dL — ABNORMAL HIGH (ref 70–99)
Glucose, Bld: 113 mg/dL — ABNORMAL HIGH (ref 70–99)
Phosphorus: 3.8 mg/dL (ref 2.5–4.6)
Phosphorus: 3.5 mg/dL (ref 2.5–4.6)
Potassium: 5.2 mmol/L — ABNORMAL HIGH (ref 3.5–5.1)
Potassium: 4.3 mmol/L (ref 3.5–5.1)
Sodium: 134 mmol/L — ABNORMAL LOW (ref 135–145)
Sodium: 135 mmol/L (ref 135–145)

## 2023-12-08 LAB — GLUCOSE, CAPILLARY
Glucose-Capillary: 178 mg/dL — ABNORMAL HIGH (ref 70–99)
Glucose-Capillary: 146 mg/dL — ABNORMAL HIGH (ref 70–99)
Glucose-Capillary: 109 mg/dL — ABNORMAL HIGH (ref 70–99)
Glucose-Capillary: 138 mg/dL — ABNORMAL HIGH (ref 70–99)

## 2023-12-08 LAB — HEPARIN LEVEL (UNFRACTIONATED): Heparin Unfractionated: >1.1 [IU]/mL — ABNORMAL HIGH (ref 0.30–0.70)

## 2023-12-08 LAB — MAGNESIUM: Magnesium: 2.2 mg/dL (ref 1.7–2.4)

## 2023-12-08 LAB — C-REACTIVE PROTEIN: CRP: 9 mg/dL — ABNORMAL HIGH (ref <1.0)

## 2023-12-08 LAB — APTT
aPTT: 94 s — ABNORMAL HIGH (ref 24–36)
aPTT: 86 s — ABNORMAL HIGH (ref 24–36)

## 2023-12-08 MED ORDER — NEPRO/CARBSTEADY PO LIQD
237.0000 mL | Freq: Two times a day (BID) | ORAL | Status: DC
  Administered 2023-12-11: 237 mL via ORAL

## 2023-12-08 MED ORDER — GABAPENTIN 300 MG PO CAPS
300.0000 mg | ORAL_CAPSULE | Freq: Two times a day (BID) | ORAL | Status: DC
  Administered 2023-12-08 – 2023-12-11 (×6): 300 mg via ORAL
  Filled 2023-12-08 (×6): qty 1

## 2023-12-08 MED ORDER — PHENOL 1.4 % MT LIQD: 1.0000 | OROMUCOSAL | Status: DC | PRN

## 2023-12-08 MED ORDER — SODIUM ZIRCONIUM CYCLOSILICATE 10 G PO PACK
10.0000 g | PACK | Freq: Every day | ORAL | Status: DC
  Administered 2023-12-08 – 2023-12-09 (×2): 10 g via ORAL
  Filled 2023-12-08 (×2): qty 1

## 2023-12-08 MED ORDER — TRAZODONE HCL 50 MG PO TABS
100.0000 mg | ORAL_TABLET | Freq: Every evening | ORAL | Status: AC | PRN
  Administered 2023-12-08 – 2023-12-12 (×3): 100 mg via ORAL
  Filled 2023-12-08 (×3): qty 2

## 2023-12-08 NOTE — Progress Notes (Addendum)
 eLink Physician-Brief Progress Note Patient Name: Shane [name] DOB: Jul 12, 1941 MRN: [id number]   Date of Service  12/08/2023  HPI/Events of Note  83 year old male requesting his home Benadryl  for sleep.  Currently prescribed melatonin  eICU Interventions  Can try addition of trazodone , Benadryl  is relatively contraindicated in the elderly especially long-term   0256 -patient developed sudden onset 10 out of 10 abdominal pain associated with nausea shortly after taking Tylenol .  He has been constipated but had 3 large bowel movements today had no other abdominal complaints.  No diarrhea, no bleeding, physical exam consistent with exquisite tenderness to palpation with guarding concerning for surgical abdomen.  Will administer Dilaudid  1 mg IV and obtain CT abdomen/pelvis stat.  With regards to meds-received Lokelma  which can increase risk of gut ischemia but otherwise no other concerning meds.  On GI prophylaxis.  Transitioned to IHD -May benefit from additional run today given contrast load  0324 -discussed with radiology that they see an active focus of bleeding and a large retroperitoneal hematoma.  Additional pain meds.  Ground team evaluation pending-May need intubation and continuous analgesics for pain management.  Type and screen is sent. Stop heparin ,  Will order platelets and protamine .  Has central access through the hemodialysis line port.  Will consult with surgery, but suspect may need IR intervention.   9391 -surgery not anticipating surgical intervention.  Discussing the case with interventional radiology.  Pain is persistent.  Switch from Dilaudid  IV pushes to Dilaudid  PCA.  Given this will take some time to prepare, one-time fentanyl  push.  Maintain end-tidal CO2 monitoring.  Difficult to see how the patient is going to lay still with the gravity of his pain for an IR guided procedure.  Still consentable for now.     Intervention Category Minor Interventions: Routine  modifications to care plan (e.g. PRN medications for pain, fever)  Shane [name] 12/08/2023, 7:49 PM

## 2023-12-08 NOTE — Progress Notes (Signed)
 [facility] Progress Note   Assessment/ Plan:   1.  Acute kidney injury: Appears to be hemodynamically mediated possibly from a combination of CHF exacerbation +/- sepsis with evolution to ATN.   - Crrt 1/2-1/3  - will d/c CRRT this afternoon in preparation for move out of ICU/ transition to IHD  2.  Hyperkalemia:  - corrected - diet education provided to pt and wife - lokelma  daily  3.  Anion gap metabolic acidosis:  - CRRT will correct this  4.  Shock: Appears to be largely cardiogenic but cannot rule out septic component with presentation symptoms suggestive of LRTI.  He has been started on antibiotic coverage for community-acquired pneumonia.  5.  Acute hypoxic respiratory failure: Appears to be from a combination of CHF exacerbation +/- underlying interstitial lung disease and now LRTI/community-acquired pneumonia.  On oxygen supplementation and antimicrobial coverage.  6.  Anemia: Monitor hemoglobin/hematocrit trend anticipating some improvement with diuresis.    Subjective:    Of pressors, on 2L O2.  Wife at bedside.  Feeling much better today.      Objective:   BP (!) 108/45   Pulse 62   Temp 98 F (36.7 C) (Oral)   Resp 17   Ht 6' (1.829 m)   Wt 108.6 kg   SpO2 98%   BMI 32.47 kg/m   Intake/Output Summary (Last 24 hours) at 12/08/2023 9047 Last data filed at 12/08/2023 0944 Gross per 24 hour  Intake 602.79 ml  Output 1575.2 ml  Net -972.41 ml   Weight change: 6.6 kg  Physical Exam: Hzw:noizm gentleman,better appearing CVS: RRR now Resp: Clear Abd: soft Ext: + multiple bruises, s/p R BKA with L TMT and + ulcer ACCESS: R internal jugular nontunneled HD cath  Imaging: ECHOCARDIOGRAM COMPLETE Result Date: 12/07/2023    ECHOCARDIOGRAM REPORT   Patient Name:   Shane [name] Date of Exam: 12/07/2023 Medical Rec #:  [id number]        Height:       72.0 in Accession #:    [id number]       Weight:       241.2 lb Date of Birth:  1940/12/28        BSA:           2.307 m Patient Age:    82 years         BP:           120/54 mmHg Patient Gender: M                HR:           75 bpm. Exam Location:  Inpatient Procedure: 2D Echo, Color Doppler, Cardiac Doppler and Intracardiac            Opacification Agent STAT ECHO Indications:    NSTEMI  History:        Patient has prior history of Echocardiogram examinations, most                 recent 06/22/2023. CHF, Pacemaker and Prior CABG, chronic kidney                 disease. Covid., Arrythmias:RBBB; Risk Factors:Sleep Apnea,                 Diabetes and Hypertension.                 Aortic Valve: 29 mm Edwards Sapien prosthetic, stented (TAVR)  valve is present in the aortic position. Procedure Date:                 08/16/23.  Sonographer:    Tinnie Barefoot RDCS Referring Phys: [id number] FONDA JAYSON SHARPS  Sonographer Comments: Technically difficult study due to poor echo windows. Image acquisition challenging due to patient body habitus. IMPRESSIONS  1. Left ventricular ejection fraction, by estimation, is 40%. The left ventricle has mild to moderately decreased function. The left ventricle demonstrates regional wall motion abnormalities with septal and inferior severe hypokinesis. Even with Definity  contrast, images were poor. Left ventricular diastolic parameters are indeterminate.  2. Peak RV-RA gradient 33 mmHg. IVC not visualized. Right ventricular systolic function is moderately reduced. The right ventricular size is normal.  3. Left atrial size was moderately dilated.  4. Right atrial size was mildly dilated.  5. The mitral valve is normal in structure. Mild to moderate mitral valve regurgitation. No evidence of mitral stenosis.  6. Bioprosthetic aortic valve s/p TAVR with 29 mm Edwards Sapien THV. Mean gradient 8 mmHg, EOA 1.57 cm^2 with dimensionless index 0.46. No peri-valvular leakage noted. The aortic valve has been repaired/replaced. Aortic valve regurgitation is not visualized.  7. Aortic  dilatation noted. There is mild dilatation of the ascending aorta, measuring 39 mm. FINDINGS  Left Ventricle: Left ventricular ejection fraction, by estimation, is 40%. The left ventricle has mild to moderately decreased function. The left ventricle demonstrates regional wall motion abnormalities. Definity  contrast agent was given IV to delineate the left ventricular endocardial borders. The left ventricular internal cavity size was normal in size. There is no left ventricular hypertrophy. Left ventricular diastolic parameters are indeterminate. Right Ventricle: Peak RV-RA gradient 33 mmHg. IVC not visualized. The right ventricular size is normal. No increase in right ventricular wall thickness. Right ventricular systolic function is moderately reduced. Left Atrium: Left atrial size was moderately dilated. Right Atrium: Right atrial size was mildly dilated. Pericardium: There is no evidence of pericardial effusion. Mitral Valve: The mitral valve is normal in structure. Mild mitral annular calcification. Mild to moderate mitral valve regurgitation. No evidence of mitral valve stenosis. Tricuspid Valve: The tricuspid valve is normal in structure. Tricuspid valve regurgitation is trivial. Aortic Valve: Bioprosthetic aortic valve s/p TAVR with 29 mm Edwards Sapien THV. Mean gradient 8 mmHg, EOA 1.57 cm^2 with dimensionless index 0.46. No peri-valvular leakage noted. The aortic valve has been repaired/replaced. Aortic valve regurgitation is  not visualized. Aortic valve mean gradient measures 8.0 mmHg. Aortic valve peak gradient measures 13.8 mmHg. Aortic valve area, by VTI measures 1.57 cm. There is a 29 mm Edwards Sapien prosthetic, stented (TAVR) valve present in the aortic position. Procedure Date: 08/16/23. Pulmonic Valve: The pulmonic valve was normal in structure. Pulmonic valve regurgitation is trivial. Aorta: Aortic dilatation noted. There is mild dilatation of the ascending aorta, measuring 39 mm. IAS/Shunts:  No atrial level shunt detected by color flow Doppler.  LEFT VENTRICLE PLAX 2D LVIDd:         5.40 cm LVIDs:         4.00 cm LV PW:         1.10 cm LV IVS:        1.10 cm LVOT diam:     2.20 cm LV SV:         64 LV SV Index:   28 LVOT Area:     3.80 cm  RIGHT VENTRICLE RV Basal diam:  2.90 cm RV S prime:  6.64 cm/s TAPSE (M-mode): 0.9 cm LEFT ATRIUM              Index        RIGHT ATRIUM           Index LA diam:        4.80 cm  2.08 cm/m   RA Area:     21.30 cm LA Vol (A2C):   110.0 ml 47.68 ml/m  RA Volume:   58.90 ml  25.53 ml/m LA Vol (A4C):   117.0 ml 50.71 ml/m LA Biplane Vol: 121.0 ml 52.45 ml/m  AORTIC VALVE AV Area (Vmax):    1.59 cm AV Area (Vmean):   1.51 cm AV Area (VTI):     1.57 cm AV Vmax:           186.00 cm/s AV Vmean:          127.000 cm/s AV VTI:            0.410 m AV Peak Grad:      13.8 mmHg AV Mean Grad:      8.0 mmHg LVOT Vmax:         77.80 cm/s LVOT Vmean:        50.450 cm/s LVOT VTI:          0.169 m LVOT/AV VTI ratio: 0.41  AORTA Ao Root diam: 3.80 cm Ao Asc diam:  3.90 cm TRICUSPID VALVE TR Peak grad:   32.9 mmHg TR Vmax:        287.00 cm/s  SHUNTS Systemic VTI:  0.17 m Systemic Diam: 2.20 cm Dalton McleanMD Electronically signed by Ezra Kanner Signature Date/Time: 12/07/2023/9:49:35 AM    Final    DG CHEST PORT 1 VIEW Result Date: 12/07/2023 CLINICAL DATA:  Central line placement. EXAM: PORTABLE CHEST 1 VIEW COMPARISON:  Radiograph yesterday. FINDINGS: Right internal jugular central venous catheter tip overlies the upper SVC. No pneumothorax. Median sternotomy. Stable heart size and mediastinal contours. TAVR and lead less pacemaker. Pulmonary edema and small pleural effusions. IMPRESSION: 1. Right internal jugular central venous catheter tip overlies the upper SVC. No pneumothorax. 2. Pulmonary edema and small pleural effusions. Electronically Signed   By: Andrea Gasman M.D.   On: 12/07/2023 03:41   US  RENAL Result Date: 12/06/2023 CLINICAL DATA:  Acute renal  insufficiency EXAM: RENAL / URINARY TRACT ULTRASOUND COMPLETE COMPARISON:  04/27/2018 FINDINGS: Right Kidney: Renal measurements: 11.8 x 5.2 x 4.3 cm = volume: 137.2 mL. Echogenicity within normal limits. No mass or hydronephrosis visualized. Left Kidney: Renal measurements: 11.7 x 5.6 x 4.5 cm = volume: 154.4 mL. Echogenicity within normal limits. No mass or hydronephrosis visualized. Bladder: Appears normal for degree of bladder distention. Other: None. IMPRESSION: 1. Unremarkable renal ultrasound. Electronically Signed   By: Ozell Daring M.D.   On: 12/06/2023 22:10   DG Chest Port 1 View Result Date: 12/06/2023 CLINICAL DATA:  SOB. EXAM: PORTABLE CHEST - 1 VIEW COMPARISON:  06/21/2023. FINDINGS: Cardiac silhouette is prominent. There is pulmonary interstitial prominence with vascular congestion. No focal consolidation. No pneumothorax. There are bilateral pleural effusions. 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 IMPRESSION: Findings suggest CHF. Electronically Signed   By: Fonda Field M.D.   On: 12/06/2023 19:13    Labs: BMET Recent Labs  Lab 12/06/23 1840 12/06/23 2020 12/06/23 2310 12/07/23 0116 12/07/23 0500 12/07/23 0949 12/07/23 1554 12/08/23 0355  NA 130* 129*  --  128* 130*  --  132* 134*  K 7.1* >7.5* 6.2* 6.3* 6.7* 5.7* 4.9 5.2*  CL 100 96*  --  94* 96*  --  94* 97*  CO2 16* 18*  --  15* 17*  --  22 24  GLUCOSE 165* 165*  --  282* 321*  --  163* 170*  BUN 116* 118*  --  122* 123*  --  75* 53*  CREATININE 4.69* 4.79* 5.16* 5.29* 5.23*  --  2.72* 1.66*  CALCIUM  7.9* 8.3*  --  8.2* 8.2*  --  8.2* 8.5*  PHOS  --   --  7.6* 7.3* 6.8*  --  4.2 3.8   CBC Recent Labs  Lab 12/06/23 1840 12/06/23 2311 12/07/23 0116 12/07/23 2241 12/08/23 0355  WBC 8.8 8.7 7.7 13.2* 13.2*  NEUTROABS 6.7  --   --   --   --   HGB 9.5* 8.9* 9.1* 9.2* 9.1*  HCT 30.1* 28.0* 28.6* 27.7* 27.3*  MCV 86.2 85.6 83.6 81.7 82.5  PLT 192 161 175 168 178     Medications:     atorvastatin   80 mg Oral Daily   Chlorhexidine  Gluconate Cloth  6 each Topical Daily   clopidogrel   75 mg Oral Daily   dexamethasone  (DECADRON ) injection  6 mg Intravenous Q24H   docusate sodium   100 mg Oral BID   escitalopram   10 mg Oral Daily   feeding supplement  237 mL Oral BID BM   insulin  aspart  0-20 Units Subcutaneous TID WC   insulin  aspart  0-5 Units Subcutaneous QHS   insulin  glargine-yfgn  10 Units Subcutaneous Daily   multivitamin  1 tablet Oral QHS   pantoprazole   40 mg Oral Daily   sodium zirconium cyclosilicate   10 g Oral Daily    Almarie Bonine MD 12/08/2023, 9:52 AM

## 2023-12-08 NOTE — Progress Notes (Signed)
 NAME:  Shane [name], MRN:  [id number], DOB:  Nov 17, 1941, LOS: 2 ADMISSION DATE:  12/06/2023, CONSULTATION DATE:  12/06/2023 REFERRING MD:  Ula, EDP, CHIEF COMPLAINT:  aki, hypotension  History of Present Illness:  83 year old male with significant past medical history including obstructive sleep apnea on CPAP at night, CAD s/p CABG x3 in 2021, aortic stenosis s/p [facility]AVR 08/2023 at [facility], HFpEF, atrial fibrillation on Eliquis , plavix , sick sinus syndrome s/p leadless PPM, hypertension, hyperlipidemia, GERD, uncontrolled diabetes, CKD 3, pulmonary fibrosis followed by Dr. Geronimo, who presented to the emergency department on 12/06/2023 with shortness of breath.  Per the ED provider complaining of cough and shortness of breath over the past 3 days.  Using his nebulizers at home with no improvement in his symptoms.  EMS was called and found to be hypotensive and hypoxic.  He was started on nonrebreather and an epinephrine  infusion prior to arrival.  In the ED afebrile, SBP around 100, tachypneic.  Labs +COVID, AKI on CKD with BUN/Cr 116, 4.69 with critical K of 7.1. bicarb 16. CBC without leukocytosis, hgb 9.5. lactic 2.3>1.9, troponin 8,290, UA negative, blood cultures pending. CXR with likely pulmonary edema. With new AKI, hyperkalemia and EKG changes, nephrology was consulted. Patient given lokelma , albuterol , Lasix , sodium bicarb, 5U insulin  with D50, calcium  gluconate. Was also given azithromycin /rocephin  CAP coverage. Neprhology recommended albumin  for BP support (initially on 2mcg levo, now off), Lasix  80mg  IV, renal ultrasound. Pending repeat BMP on consult for admission to ICU.  On my exam, laying in bed, mildly tachypneic on albuterol  treatment. Alert and oriented. Endorses the above story. He does state he had diarrhea over the preceding 2 days and intermittent nausea without vomiting. Decreased appetite. He does feel volume overloaded from his baseline. States his abdomen feels tighter. Wife is at  bedside, endorses story.   Pertinent  Medical History  obstructive sleep apnea on CPAP at night, CAD s/p CABG x3 in 2021, aortic stenosis s/p TAVR 08/2023 at [facility], HFpEF, atrial fibrillation on Eliquis , plavix , sick sinus syndrome s/p leadless PPM, hypertension, hyperlipidemia, GERD, uncontrolled diabetes, CKD 3, pulmonary fibrosis  Significant Hospital Events: Including procedures, antibiotic start and stop dates in addition to other pertinent events   1/1: Admit to CCM for covid pneumonia, aki with hyperK likely needing CRRT  1/2: CRRT initiated, Card Consult due to elev Troponin (Nstemi vs Demand Ischemia)  1/3: Continuing CRRT till this afternoon, PCCM sign off this afternoon, trx to [facility] tomorrow   Interim History / Subjective:  On RA, following commands, breathing improved   Objective   Blood pressure (!) 156/61, pulse 74, temperature 98 F (36.7 C), temperature source Oral, resp. rate (!) 25, height 6' (1.829 m), weight 108.6 kg, SpO2 96%.        Intake/Output Summary (Last 24 hours) at 12/08/2023 1134 Last data filed at 12/08/2023 1100 Gross per 24 hour  Intake 592.24 ml  Output 1557.2 ml  Net -964.96 ml   Filed Weights   12/07/23 0000 12/07/23 0344 12/08/23 0500  Weight: 109.4 kg 109.4 kg 108.6 kg    Examination: General: chronically ill older adult, sitting up in ICU bed on CRRT  HENT: Normocephalic, Rt HD cath, Poor dentition, Pink MM Lungs: clear breath sounds, diminished lower bases, no distress  Cardiovascular: s1,s2, paced, no JVD, no MRG Abdomen: BS, soft, obese  Extremities: right BKA, partial left foot amputation, moves all extremities  Neuro: AO x4, follows commands   GU: Foley, urine yellow   Resolved Hospital Problem  list    Assessment & Plan:  Acute hypoxic respiratory failure in setting of Covid 19  Obstructive sleep apnea on CPAP (2 L baseline)  HFpEF  Pulmonary fibrosis Likely multifactorial in the setting of acute COVID-19 vs CHF exacerbation vs  Pulmonary Fibrosis flare vs CAP  Presented to ED hypoxic requiring NRB, now on 2L [place], SOB improving  1/1 Chest x-ray that does demonstrate some pulmonary edema in the setting of CHF and clinically has rales diffusely. Received x1 dose azithro/rocephin  in ED for CAP. Not febrile, no WBC. Lower suspicion for superimposed bacterial infection at present.  Received lasix  80mg  IV with no success, CRRT started due to fluid overload and AKI   Procal .22  P:  Continue Remdesivir  and Decadron  IV  Duonebs PRN  O2 Sat goal > 92% Continue PRN duonebs  Continue CRRT for fluid removal per nephro   Acute kidney injury on chronic kidney disease; baseline creatinine 1.7-1.9 Hyperkalemia with EKG changes-resolving  Non anion gap metabolic acidosis-resolving  Initial labs with BUN 116, sCr 4.69, potassium 7.1.  AKI also likely multifactorial in the setting of CHF, hemodynamic instability with hypotension initially reported.  Renal ultrasound is normal.   NAGMA likely sequela of renal dysfunction, also had double days of diarrhea. Hyperkalemia trending down with CRRT, and use of lokelma   P: Continuing CRRT, will stop later today on 1/3  Nephro following, appreciate recs and assitance Continue to trend renal function, mag, phos, optimize electrolytes Continue use of foley cath, may remove after CRRT dc'd  Continue strict I/o  Continue avoid nephrotoxic agents Continue adequate renal perfusion   Acute on chronic heart failure exacerbation;  Elevated Troponin, Concern for NSTEMI most recent echo 06/2023 EF 50-55%, LVH, G3DD, RV mod reduced, LA severely dilated, RA mod dilated troponin in the ED 8,290>> 7,625 BNP 480.6 EKG with paced rhythm.  Known significant cardiac disease.  He does report having episodes of chest pain over the past few days with his shortness of breath.  ECHO LVEF 40 %, LV function moderate decrease in function, LV demonstrates regional wall abnormalities, severe hypokinesis, Mild to  moderate Mitral valve regurg. RV systolic function reduced  P: Cardiology following appreciate reqs  Continue heparin  gtt, continue plavix  and statin  Transition back to eliquis  when appropriate  Continue fluid removal via CRRT Continue to trend heparin  levels, aptt per pharmacy, appreciate assistance   CAD HFpEF Atrial fibrillation on Eliquis  Sick sinus syndrome s/p leadless PPM Aortic stenosis s/p TAVR P: Continue heparin  gtt  Cards following, appreciate assistance and recs  Will need ischemic cardiac workup once clinically appropriate   Hypertension  Hyperlipidemia Off levophed  P: Continue to hold antihypertensives for now  DC levophed  gtt  Start statin   Acute on chronic anemia No signs of active bleeding. P: Continue to follow for signs of active bleeding Continue to trend CBCs daily, continue to trend h/h   Type 2 diabetes poorly controlled most recent A1c 9  P: Continue SSI Continue Basal insulin   Heart healthy diet with Carb modifications   Suspected Esophageal Dysphagia  Per speech eval patient having issues with swallowing, regurgitation during and prior to admission, as well as aversion to certain solid food Speech therapy recommending GI consult for possible esophogram/MBS  P:  Will consult GI for further eval Continue Regular thin liquid diet per SPT recs    Best Practice (right click and Reselect all SmartList Selections daily)   Diet/type: Reg, thin DVT prophylaxis:  heparin  gtt  Pressure ulcer(s):  Left foot wound, POA GI prophylaxis: N/A Lines: N/A Foley:  Yes, and it is still needed Code Status:  full code  Last date of multidisciplinary goals of care discussion  Patient and wife updated at beside   Critical care time: 35 min    Christian Arella Blinder AGACNP-BC   [facility] 12/08/2023, 12:22 PM  Please see [email or web address] for pager details.  From 7A-7P if no response, please call [phone]. After hours, please call  ELink [phone].

## 2023-12-08 NOTE — Progress Notes (Signed)
 SLP Cancellation Note  Patient Details Name: Shane [name] MRN: [id number] DOB: 08-22-1941   Cancelled treatment:       Reason Eval/Treat Not Completed: Received orders for MBS. Discussed with NP as recommendations were more appropriate for GI consult with potential barium swallow study to assess esophageal function. Please re-consult if needs arise following GI assessment.    Damien Blumenthal, M.A., CF-SLP Speech Language Pathology, Acute Rehabilitation Services  Secure Chat preferred [phone]  12/08/2023, 9:14 AM

## 2023-12-08 NOTE — Progress Notes (Signed)
 PHARMACY - ANTICOAGULATION CONSULT NOTE  Pharmacy Consult for heparin  Indication:  concern for ACS and h/o Afib  Allergies  Allergen Reactions   Codeine Hives and Itching   Ofev  [Nintedanib ] Diarrhea    SEVERE DIARRHEA   Pirfenidone  Diarrhea and Other (See Comments)    Esbriet  (Pirfenidone ) causes elevated LFTs. D/C on 06/14/17 and SEVERE DIARRHEA     Patient Measurements: Height: 6' (182.9 cm) Weight: 108.6 kg (239 lb 6.7 oz) IBW/kg (Calculated) : 77.6 Heparin  Dosing Weight: 100kg  Vital Signs: Temp: 98.1 F (36.7 C) (01/03 1200) Temp Source: Oral (01/03 1200) BP: 122/82 (01/03 1800) Pulse Rate: 67 (01/03 1815)  Labs: Recent Labs    12/06/23 1840 12/06/23 2020 12/06/23 2128 12/06/23 2310 12/06/23 2311 12/07/23 0116 12/07/23 0405 12/07/23 1119 12/07/23 1554 12/07/23 2241 12/08/23 0355 12/08/23 0901 12/08/23 1609  HGB 9.5*  --   --   --  8.9* 9.1*  --   --   --  9.2* 9.1*  --   --   HCT 30.1*  --   --   --  28.0* 28.6*  --   --   --  27.7* 27.3*  --   --   PLT 192  --   --   --  161 175  --   --   --  168 178  --   --   APTT 37*  --   --   --   --   --    < > >200*  --  133*  --  94* 86*  LABPROT 18.8*  --   --   --   --   --   --   --   --   --   --   --   --   INR 1.6*  --   --   --   --   --   --   --   --   --   --   --   --   HEPARINUNFRC  --   --   --   --   --   --   --  >1.10*  --   --  >1.10*  --   --   CREATININE 4.69*   < >  --    < >  --  5.29*   < >  --  2.72*  --  1.66*  --  1.42*  TROPONINIHS  --   --  8,290*  --  7,625*  --   --   --   --   --   --   --   --    < > = values in this interval not displayed.    Estimated Creatinine Clearance: 51.1 mL/min (A) (by C-G formula based on SCr of 1.42 mg/dL (H)).  Assessment: 83yo male c/o SOB, found to be Covid+, troponin also elevated >> concern for NSTEMI, to transition from apixaban  for Afib to heparin ; last dose of apixaban  was taken 12/31 pm.  Current on Heparin  IV at 1000 units/hr.  aPTT  therapeutic at 94. Dried blood around IV site above where heparin  is running, not currently bleeding.   CBC shows Hgb low, stable and platelets within normal limits.  Planning to stop CRRT around 1600 PM.   1/3 PM update: aPTT 86 seconds No signs of bleeding or issues with the heparin  infusion per RN  Goal of Therapy:  Heparin  level 0.3-0.7 units/ml aPTT 66-102 seconds Monitor platelets by anticoagulation  protocol: Yes   Plan:  Continue Heparin  at 950 units/hr Recheck aPTT / HL with AM labs Monitor heparin  levels, aPTT (while DOAC affects anti-Xa assay), and CBC.   Benedetta Heath BS, PharmD, BCPS Clinical Pharmacist 12/08/2023 7:13 PM  Contact: [phone] after 3 PM  Be curious, not judgmental... -Davina Sprinkles

## 2023-12-08 NOTE — Progress Notes (Addendum)
 Progress Note  Patient Name: Shane [name] Date of Encounter: 12/08/2023  Primary Cardiologist: Redell Shallow, MD   Subjective   Patient seen and examined at his bedside.  Undergoing hemodialysis during my visit.  Wife at bedside.  Inpatient Medications    Scheduled Meds:  atorvastatin   80 mg Oral Daily   Chlorhexidine  Gluconate Cloth  6 each Topical Daily   clopidogrel   75 mg Oral Daily   dexamethasone  (DECADRON ) injection  6 mg Intravenous Q24H   docusate sodium   100 mg Oral BID   escitalopram   10 mg Oral Daily   feeding supplement  237 mL Oral BID BM   insulin  aspart  0-20 Units Subcutaneous TID WC   insulin  aspart  0-5 Units Subcutaneous QHS   insulin  glargine-yfgn  10 Units Subcutaneous Daily   multivitamin  1 tablet Oral QHS   pantoprazole   40 mg Oral Daily   sodium zirconium cyclosilicate   10 g Oral Daily   Continuous Infusions:  heparin  1,000 Units/hr (12/08/23 0800)   norepinephrine  (LEVOPHED ) Adult infusion Stopped (12/07/23 0805)   prismasol  BGK 2/2.5 dialysis solution 1,500 mL/hr at 12/08/23 0201   prismasol  BGK 2/2.5 replacement solution 400 mL/hr at 12/07/23 2229   prismasol  BGK 2/2.5 replacement solution 400 mL/hr at 12/08/23 0912   remdesivir  100 mg in sodium chloride  0.9 % 100 mL IVPB 100 mg (12/08/23 0917)   PRN Meds: acetaminophen , guaiFENesin , heparin , heparin  sodium (porcine), heparin , ipratropium-albuterol , melatonin, ondansetron  (ZOFRAN ) IV, mouth rinse, polyethylene glycol   Vital Signs    Vitals:   12/08/23 0730 12/08/23 0743 12/08/23 0745 12/08/23 0800  BP: 117/62   118/67  Pulse: (!) 59  72 75  Resp: 12  (!) 22   Temp:  98 F (36.7 C)    TempSrc:  Oral    SpO2: 97%  99% 98%  Weight:      Height:        Intake/Output Summary (Last 24 hours) at 12/08/2023 0921 Last data filed at 12/08/2023 0800 Gross per 24 hour  Intake 465.8 ml  Output 1470.5 ml  Net -1004.7 ml   Filed Weights   12/07/23 0000 12/07/23 0344 12/08/23 0500   Weight: 109.4 kg 109.4 kg 108.6 kg    Telemetry    Sinus rhythm - Personally Reviewed  ECG     - Personally Reviewed  Physical Exam     General: Comfortable,on HD  Head: Atraumatic, normal size  Eyes: PEERLA, EOMI  Neck: Supple, normal JVD Cardiac: Normal S1, S2; RRR; no murmurs, rubs, or gallops Lungs: Clear to auscultation bilaterally Abd: Soft, nontender, no hepatomegaly  Ext: warm, no edema Musculoskeletal: No deformities, BUE and BLE strength normal and equal Skin: Warm and dry, no rashes   Neuro: Alert and oriented to person, place, time, and situation, CNII-XII grossly intact, no focal deficits  Psych: Normal mood and affect   Labs    Chemistry Recent Labs  Lab 12/06/23 1840 12/06/23 2020 12/07/23 0500 12/07/23 0949 12/07/23 1554 12/08/23 0355  NA 130*   < > 130*  --  132* 134*  K 7.1*   < > 6.7* 5.7* 4.9 5.2*  CL 100   < > 96*  --  94* 97*  CO2 16*   < > 17*  --  22 24  GLUCOSE 165*   < > 321*  --  163* 170*  BUN 116*   < > 123*  --  75* 53*  CREATININE 4.69*   < > 5.23*  --  2.72* 1.66*  CALCIUM  7.9*   < > 8.2*  --  8.2* 8.5*  PROT 6.8  --   --   --   --   --   ALBUMIN  2.9*  --  2.9*  --  2.7* 2.7*  AST 45*  --   --   --   --   --   ALT 16  --   --   --   --   --   ALKPHOS 85  --   --   --   --   --   BILITOT 0.6  --   --   --   --   --   GFRNONAA 12*   < > 10*  --  23* 41*  ANIONGAP 14   < > 17*  --  16* 13   < > = values in this interval not displayed.     Hematology Recent Labs  Lab 12/07/23 0116 12/07/23 2241 12/08/23 0355  WBC 7.7 13.2* 13.2*  RBC 3.42* 3.39* 3.31*  HGB 9.1* 9.2* 9.1*  HCT 28.6* 27.7* 27.3*  MCV 83.6 81.7 82.5  MCH 26.6 27.1 27.5  MCHC 31.8 33.2 33.3  RDW 18.9* 18.9* 19.0*  PLT 175 168 178    Cardiac EnzymesNo results for input(s): TROPONINI in the last 168 hours. No results for input(s): TROPIPOC in the last 168 hours.   BNP Recent Labs  Lab 12/06/23 2310  BNP 480.6*     DDimer No results for  input(s): DDIMER in the last 168 hours.   Radiology    ECHOCARDIOGRAM COMPLETE Result Date: 12/07/2023    ECHOCARDIOGRAM REPORT   Patient Name:   Shane [name] Date of Exam: 12/07/2023 Medical Rec #:  [id number]        Height:       72.0 in Accession #:    [id number]       Weight:       241.2 lb Date of Birth:  09-21-1941        BSA:          2.307 m Patient Age:    82 years         BP:           120/54 mmHg Patient Gender: M                HR:           75 bpm. Exam Location:  Inpatient Procedure: 2D Echo, Color Doppler, Cardiac Doppler and Intracardiac            Opacification Agent STAT ECHO Indications:    NSTEMI  History:        Patient has prior history of Echocardiogram examinations, most                 recent 06/22/2023. CHF, Pacemaker and Prior CABG, chronic kidney                 disease. Covid., Arrythmias:RBBB; Risk Factors:Sleep Apnea,                 Diabetes and Hypertension.                 Aortic Valve: 29 mm Edwards Sapien prosthetic, stented (TAVR)                 valve is present in the aortic position. Procedure Date:  08/16/23.  Sonographer:    Tinnie Barefoot RDCS Referring Phys: [id number] Shane [name]  Sonographer Comments: Technically difficult study due to poor echo windows. Image acquisition challenging due to patient body habitus. IMPRESSIONS  1. Left ventricular ejection fraction, by estimation, is 40%. The left ventricle has mild to moderately decreased function. The left ventricle demonstrates regional wall motion abnormalities with septal and inferior severe hypokinesis. Even with Definity  contrast, images were poor. Left ventricular diastolic parameters are indeterminate.  2. Peak RV-RA gradient 33 mmHg. IVC not visualized. Right ventricular systolic function is moderately reduced. The right ventricular size is normal.  3. Left atrial size was moderately dilated.  4. Right atrial size was mildly dilated.  5. The mitral valve is normal in structure. Mild to  moderate mitral valve regurgitation. No evidence of mitral stenosis.  6. Bioprosthetic aortic valve s/p TAVR with 29 mm Edwards Sapien THV. Mean gradient 8 mmHg, EOA 1.57 cm^2 with dimensionless index 0.46. No peri-valvular leakage noted. The aortic valve has been repaired/replaced. Aortic valve regurgitation is not visualized.  7. Aortic dilatation noted. There is mild dilatation of the ascending aorta, measuring 39 mm. FINDINGS  Left Ventricle: Left ventricular ejection fraction, by estimation, is 40%. The left ventricle has mild to moderately decreased function. The left ventricle demonstrates regional wall motion abnormalities. Definity  contrast agent was given IV to delineate the left ventricular endocardial borders. The left ventricular internal cavity size was normal in size. There is no left ventricular hypertrophy. Left ventricular diastolic parameters are indeterminate. Right Ventricle: Peak RV-RA gradient 33 mmHg. IVC not visualized. The right ventricular size is normal. No increase in right ventricular wall thickness. Right ventricular systolic function is moderately reduced. Left Atrium: Left atrial size was moderately dilated. Right Atrium: Right atrial size was mildly dilated. Pericardium: There is no evidence of pericardial effusion. Mitral Valve: The mitral valve is normal in structure. Mild mitral annular calcification. Mild to moderate mitral valve regurgitation. No evidence of mitral valve stenosis. Tricuspid Valve: The tricuspid valve is normal in structure. Tricuspid valve regurgitation is trivial. Aortic Valve: Bioprosthetic aortic valve s/p TAVR with 29 mm Edwards Sapien THV. Mean gradient 8 mmHg, EOA 1.57 cm^2 with dimensionless index 0.46. No peri-valvular leakage noted. The aortic valve has been repaired/replaced. Aortic valve regurgitation is  not visualized. Aortic valve mean gradient measures 8.0 mmHg. Aortic valve peak gradient measures 13.8 mmHg. Aortic valve area, by VTI measures  1.57 cm. There is a 29 mm Edwards Sapien prosthetic, stented (TAVR) valve present in the aortic position. Procedure Date: 08/16/23. Pulmonic Valve: The pulmonic valve was normal in structure. Pulmonic valve regurgitation is trivial. Aorta: Aortic dilatation noted. There is mild dilatation of the ascending aorta, measuring 39 mm. IAS/Shunts: No atrial level shunt detected by color flow Doppler.  LEFT VENTRICLE PLAX 2D LVIDd:         5.40 cm LVIDs:         4.00 cm LV PW:         1.10 cm LV IVS:        1.10 cm LVOT diam:     2.20 cm LV SV:         64 LV SV Index:   28 LVOT Area:     3.80 cm  RIGHT VENTRICLE RV Basal diam:  2.90 cm RV S prime:     6.64 cm/s TAPSE (M-mode): 0.9 cm LEFT ATRIUM              Index  RIGHT ATRIUM           Index LA diam:        4.80 cm  2.08 cm/m   RA Area:     21.30 cm LA Vol (A2C):   110.0 ml 47.68 ml/m  RA Volume:   58.90 ml  25.53 ml/m LA Vol (A4C):   117.0 ml 50.71 ml/m LA Biplane Vol: 121.0 ml 52.45 ml/m  AORTIC VALVE AV Area (Vmax):    1.59 cm AV Area (Vmean):   1.51 cm AV Area (VTI):     1.57 cm AV Vmax:           186.00 cm/s AV Vmean:          127.000 cm/s AV VTI:            0.410 m AV Peak Grad:      13.8 mmHg AV Mean Grad:      8.0 mmHg LVOT Vmax:         77.80 cm/s LVOT Vmean:        50.450 cm/s LVOT VTI:          0.169 m LVOT/AV VTI ratio: 0.41  AORTA Ao Root diam: 3.80 cm Ao Asc diam:  3.90 cm TRICUSPID VALVE TR Peak grad:   32.9 mmHg TR Vmax:        287.00 cm/s  SHUNTS Systemic VTI:  0.17 m Systemic Diam: 2.20 cm Dalton McleanMD Electronically signed by Ezra Kanner Signature Date/Time: 12/07/2023/9:49:35 AM    Final    DG CHEST PORT 1 VIEW Result Date: 12/07/2023 CLINICAL DATA:  Central line placement. EXAM: PORTABLE CHEST 1 VIEW COMPARISON:  Radiograph yesterday. FINDINGS: Right internal jugular central venous catheter tip overlies the upper SVC. No pneumothorax. Median sternotomy. Stable heart size and mediastinal contours. TAVR and lead less pacemaker.  Pulmonary edema and small pleural effusions. IMPRESSION: 1. Right internal jugular central venous catheter tip overlies the upper SVC. No pneumothorax. 2. Pulmonary edema and small pleural effusions. Electronically Signed   By: Andrea Gasman M.D.   On: 12/07/2023 03:41   US  RENAL Result Date: 12/06/2023 CLINICAL DATA:  Acute renal insufficiency EXAM: RENAL / URINARY TRACT ULTRASOUND COMPLETE COMPARISON:  04/27/2018 FINDINGS: Right Kidney: Renal measurements: 11.8 x 5.2 x 4.3 cm = volume: 137.2 mL. Echogenicity within normal limits. No mass or hydronephrosis visualized. Left Kidney: Renal measurements: 11.7 x 5.6 x 4.5 cm = volume: 154.4 mL. Echogenicity within normal limits. No mass or hydronephrosis visualized. Bladder: Appears normal for degree of bladder distention. Other: None. IMPRESSION: 1. Unremarkable renal ultrasound. Electronically Signed   By: Ozell Daring M.D.   On: 12/06/2023 22:10   DG Chest Port 1 View Result Date: 12/06/2023 CLINICAL DATA:  SOB. EXAM: PORTABLE CHEST - 1 VIEW COMPARISON:  06/21/2023. FINDINGS: Cardiac silhouette is prominent. There is pulmonary interstitial prominence with vascular congestion. No focal consolidation. No pneumothorax. There are bilateral pleural effusions. 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 IMPRESSION: Findings suggest CHF. Electronically Signed   By: Shane Field M.D.   On: 12/06/2023 19:13    Cardiac Studies   Echo 12/07/2023  1. Left ventricular ejection fraction, by estimation, is 40%. The left ventricle has mild to moderately decreased function. The left ventricle demonstrates regional wall motion abnormalities with septal and inferior severe hypokinesis. Even with  Definity  contrast, images were poor. Left ventricular diastolic parameters  are indeterminate.   2. Peak RV-RA gradient 33 mmHg. IVC not visualized. Right  ventricular  systolic function is moderately reduced. The right ventricular size is   normal.   3. Left atrial size was moderately dilated.   4. Right atrial size was mildly dilated.   5. The mitral valve is normal in structure. Mild to moderate mitral valve  regurgitation. No evidence of mitral stenosis.   6. Bioprosthetic aortic valve s/p TAVR with 29 mm Edwards Sapien THV.  Mean gradient 8 mmHg, EOA 1.57 cm^2 with dimensionless index 0.46. No  peri-valvular leakage noted. The aortic valve has been repaired/replaced.  Aortic valve regurgitation is not  visualized.   7. Aortic dilatation noted. There is mild dilatation of the ascending  aorta, measuring 39 mm.   Patient Profile     83 y.o. 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  Assessment & Plan    Coronary artery disease status post CABG Recent COVID 19 infection Status post TAVR NSTEMI in the setting of COVID-19 infection Depressed ejection fraction 40% compared to prior echocardiogram in July 2024 which is 50 to 55%   Noted NSTEMI with troponins peaking up into the 8000's.  This could be multiple reasons demand ischemia in the setting of COVID-19 infection, he currently does have wall motion abnormality with depressed ejection fraction which could be due to his COVID-19 infection.    He does have coronary artery disease therefore it is appropriate to continue heparin  drip for medical management for the next 48 hours, he is not on aspirin  at home giving his apixaban  for atrial fibrillation. Continue plavix  75 mg daily - will hold off on Aspirin  at this time.   I think overall it will be best for medical management at this time and optimize medication for his cardiomyopathy. Once blood pressure can tolerate - start toprol  xl 12.5 mg daily.   Bioprosthetic valve well-seated.    For  questions or updates, please contact [facility] Please consult [email or web address] for contact info under Cardiology/STEMI.   CRITICAL CARE Performed by: Jisell Majer  Total critical care time: 40 minutes. Critical care time was exclusive of separately billable procedures and treating other patients. Critical care was necessary to treat or prevent imminent or life-threatening deterioration. Critical care was time spent personally by me on the following activities: development of treatment plan with patient and/or surrogate as well as nursing, discussions with consultants, evaluation of patient's response to treatment, examination of patient, obtaining history from patient or surrogate, ordering and performing treatments and interventions, ordering and review of laboratory studies, ordering and review of radiographic studies, pulse oximetry and re-evaluation of patient's condition.    Signed, Jeidy Hoerner, DO [facility]  [facility]  12/08/2023 9:21 AM       Signed, Fallen Crisostomo, DO  12/08/2023, 9:21 AM

## 2023-12-08 NOTE — Plan of Care (Signed)
  Problem: Respiratory: Goal: Will maintain a patent airway Outcome: Progressing Goal: Complications related to the disease process, condition or treatment will be avoided or minimized Outcome: Progressing   Problem: Fluid Volume: Goal: Ability to maintain a balanced intake and output will improve Outcome: Progressing   Problem: Metabolic: Goal: Ability to maintain appropriate glucose levels will improve Outcome: Progressing   Problem: Nutritional: Goal: Maintenance of adequate nutrition will improve Outcome: Progressing

## 2023-12-08 NOTE — Progress Notes (Signed)
 PHARMACY - ANTICOAGULATION CONSULT NOTE  Pharmacy Consult for heparin  Indication:  concern for ACS and h/o Afib  Allergies  Allergen Reactions   Codeine Hives and Itching   Ofev  [Nintedanib ] Diarrhea    SEVERE DIARRHEA   Pirfenidone  Diarrhea and Other (See Comments)    Esbriet  (Pirfenidone ) causes elevated LFTs. D/C on 06/14/17 and SEVERE DIARRHEA     Patient Measurements: Height: 6' (182.9 cm) Weight: 108.6 kg (239 lb 6.7 oz) IBW/kg (Calculated) : 77.6 Heparin  Dosing Weight: 100kg  Vital Signs: Temp: 98 F (36.7 C) (01/03 0743) Temp Source: Oral (01/03 0743) BP: 108/45 (01/03 0900) Pulse Rate: 62 (01/03 0915)  Labs: Recent Labs    12/06/23 1840 12/06/23 2020 12/06/23 2128 12/06/23 2310 12/06/23 2311 12/07/23 0116 12/07/23 0405 12/07/23 0500 12/07/23 0949 12/07/23 1119 12/07/23 1554 12/07/23 2241 12/08/23 0355 12/08/23 0901  HGB 9.5*  --   --   --  8.9* 9.1*  --   --   --   --   --  9.2* 9.1*  --   HCT 30.1*  --   --   --  28.0* 28.6*  --   --   --   --   --  27.7* 27.3*  --   PLT 192  --   --   --  161 175  --   --   --   --   --  168 178  --   APTT 37*  --   --   --   --   --    < >  --    < > >200*  --  133*  --  94*  LABPROT 18.8*  --   --   --   --   --   --   --   --   --   --   --   --   --   INR 1.6*  --   --   --   --   --   --   --   --   --   --   --   --   --   HEPARINUNFRC  --   --   --   --   --   --   --   --   --  >1.10*  --   --  >1.10*  --   CREATININE 4.69*   < >  --    < >  --  5.29*  --  5.23*  --   --  2.72*  --  1.66*  --   TROPONINIHS  --   --  8,290*  --  7,625*  --   --   --   --   --   --   --   --   --    < > = values in this interval not displayed.    Estimated Creatinine Clearance: 43.7 mL/min (A) (by C-G formula based on SCr of 1.66 mg/dL (H)).  Assessment: 83yo male c/o SOB, found to be Covid+, troponin also elevated >> concern for NSTEMI, to transition from apixaban  for Afib to heparin ; last dose of apixaban  was taken 12/31  pm.  Current on Heparin  IV at 1000 units/hr.  aPTT therapeutic at 94. Dried blood around IV site above where heparin  is running, not currently bleeding.   CBC shows Hgb low, stable and platelets within normal limits.  Planning to stop CRRT around 1600 PM.   Goal of Therapy:  Heparin  level 0.3-0.7 units/ml aPTT 66-102 seconds Monitor platelets by anticoagulation protocol: Yes   Plan:  Reduce Heparin  to 950 to prevent accumulation.  Recheck Heparin  level at 1800 PM Monitor heparin  levels, aPTT (while DOAC affects anti-Xa assay), and CBC.  Harlene Boga, PharmD, BCPS, BCCCP Please refer to [facility] for [facility] numbers 12/08/2023 9:34 AM

## 2023-12-09 ENCOUNTER — Inpatient Hospital Stay (HOSPITAL_COMMUNITY): Payer: Medicare Other

## 2023-12-09 DIAGNOSIS — I4891 Unspecified atrial fibrillation: Secondary | ICD-10-CM

## 2023-12-09 DIAGNOSIS — I214 Non-ST elevation (NSTEMI) myocardial infarction: Secondary | ICD-10-CM

## 2023-12-09 DIAGNOSIS — Z951 Presence of aortocoronary bypass graft: Secondary | ICD-10-CM

## 2023-12-09 DIAGNOSIS — I2510 Atherosclerotic heart disease of native coronary artery without angina pectoris: Secondary | ICD-10-CM

## 2023-12-09 DIAGNOSIS — R58 Hemorrhage, not elsewhere classified: Secondary | ICD-10-CM | POA: Diagnosis not present

## 2023-12-09 DIAGNOSIS — U071 COVID-19: Secondary | ICD-10-CM | POA: Diagnosis not present

## 2023-12-09 DIAGNOSIS — N179 Acute kidney failure, unspecified: Secondary | ICD-10-CM | POA: Diagnosis not present

## 2023-12-09 DIAGNOSIS — Z952 Presence of prosthetic heart valve: Secondary | ICD-10-CM

## 2023-12-09 DIAGNOSIS — I5032 Chronic diastolic (congestive) heart failure: Secondary | ICD-10-CM

## 2023-12-09 LAB — CBC
HCT: 25.6 % — ABNORMAL LOW (ref 39.0–52.0)
HCT: 23.4 % — ABNORMAL LOW (ref 39.0–52.0)
HCT: 24.5 % — ABNORMAL LOW (ref 39.0–52.0)
Hemoglobin: 8.3 g/dL — ABNORMAL LOW (ref 13.0–17.0)
Hemoglobin: 7.6 g/dL — ABNORMAL LOW (ref 13.0–17.0)
Hemoglobin: 8 g/dL — ABNORMAL LOW (ref 13.0–17.0)
MCH: 27.2 pg (ref 26.0–34.0)
MCH: 27.3 pg (ref 26.0–34.0)
MCH: 27 pg (ref 26.0–34.0)
MCHC: 32.4 g/dL (ref 30.0–36.0)
MCHC: 32.5 g/dL (ref 30.0–36.0)
MCHC: 32.7 g/dL (ref 30.0–36.0)
MCV: 83.9 fL (ref 80.0–100.0)
MCV: 84.2 fL (ref 80.0–100.0)
MCV: 82.8 fL (ref 80.0–100.0)
Platelets: 197 10*3/uL (ref 150–400)
Platelets: 188 10*3/uL (ref 150–400)
Platelets: 174 10*3/uL (ref 150–400)
RBC: 3.05 MIL/uL — ABNORMAL LOW (ref 4.22–5.81)
RBC: 2.78 MIL/uL — ABNORMAL LOW (ref 4.22–5.81)
RBC: 2.96 MIL/uL — ABNORMAL LOW (ref 4.22–5.81)
RDW: 19.2 % — ABNORMAL HIGH (ref 11.5–15.5)
RDW: 19.1 % — ABNORMAL HIGH (ref 11.5–15.5)
RDW: 18.6 % — ABNORMAL HIGH (ref 11.5–15.5)
WBC: 15.1 10*3/uL — ABNORMAL HIGH (ref 4.0–10.5)
WBC: 12 10*3/uL — ABNORMAL HIGH (ref 4.0–10.5)
WBC: 11.8 10*3/uL — ABNORMAL HIGH (ref 4.0–10.5)
nRBC: 0.1 % (ref 0.0–0.2)
nRBC: 0 % (ref 0.0–0.2)
nRBC: 0 % (ref 0.0–0.2)

## 2023-12-09 LAB — RENAL FUNCTION PANEL
Albumin: 2.6 g/dL — ABNORMAL LOW (ref 3.5–5.0)
Albumin: 2.6 g/dL — ABNORMAL LOW (ref 3.5–5.0)
Anion gap: 9 (ref 5–15)
Anion gap: 10 (ref 5–15)
BUN: 56 mg/dL — ABNORMAL HIGH (ref 8–23)
BUN: 63 mg/dL — ABNORMAL HIGH (ref 8–23)
CO2: 25 mmol/L (ref 22–32)
CO2: 25 mmol/L (ref 22–32)
Calcium: 8.2 mg/dL — ABNORMAL LOW (ref 8.9–10.3)
Calcium: 8 mg/dL — ABNORMAL LOW (ref 8.9–10.3)
Chloride: 99 mmol/L (ref 98–111)
Chloride: 98 mmol/L (ref 98–111)
Creatinine, Ser: 1.61 mg/dL — ABNORMAL HIGH (ref 0.61–1.24)
Creatinine, Ser: 1.86 mg/dL — ABNORMAL HIGH (ref 0.61–1.24)
GFR, Estimated: 42 mL/min — ABNORMAL LOW (ref >60)
GFR, Estimated: 36 mL/min — ABNORMAL LOW (ref >60)
Glucose, Bld: 186 mg/dL — ABNORMAL HIGH (ref 70–99)
Glucose, Bld: 175 mg/dL — ABNORMAL HIGH (ref 70–99)
Phosphorus: 4.7 mg/dL — ABNORMAL HIGH (ref 2.5–4.6)
Phosphorus: 6 mg/dL — ABNORMAL HIGH (ref 2.5–4.6)
Potassium: 4.9 mmol/L (ref 3.5–5.1)
Potassium: 4.8 mmol/L (ref 3.5–5.1)
Sodium: 133 mmol/L — ABNORMAL LOW (ref 135–145)
Sodium: 133 mmol/L — ABNORMAL LOW (ref 135–145)

## 2023-12-09 LAB — COMPREHENSIVE METABOLIC PANEL
ALT: 27 U/L (ref 0–44)
AST: 64 U/L — ABNORMAL HIGH (ref 15–41)
Albumin: 2.6 g/dL — ABNORMAL LOW (ref 3.5–5.0)
Alkaline Phosphatase: 65 U/L (ref 38–126)
Anion gap: 10 (ref 5–15)
BUN: 62 mg/dL — ABNORMAL HIGH (ref 8–23)
CO2: 25 mmol/L (ref 22–32)
Calcium: 8.1 mg/dL — ABNORMAL LOW (ref 8.9–10.3)
Chloride: 97 mmol/L — ABNORMAL LOW (ref 98–111)
Creatinine, Ser: 1.92 mg/dL — ABNORMAL HIGH (ref 0.61–1.24)
GFR, Estimated: 34 mL/min — ABNORMAL LOW (ref >60)
Glucose, Bld: 215 mg/dL — ABNORMAL HIGH (ref 70–99)
Potassium: 5.7 mmol/L — ABNORMAL HIGH (ref 3.5–5.1)
Sodium: 132 mmol/L — ABNORMAL LOW (ref 135–145)
Total Bilirubin: 0.6 mg/dL (ref 0.0–1.2)
Total Protein: 5.8 g/dL — ABNORMAL LOW (ref 6.5–8.1)

## 2023-12-09 LAB — GLUCOSE, CAPILLARY
Glucose-Capillary: 105 mg/dL — ABNORMAL HIGH (ref 70–99)
Glucose-Capillary: 106 mg/dL — ABNORMAL HIGH (ref 70–99)
Glucose-Capillary: 153 mg/dL — ABNORMAL HIGH (ref 70–99)
Glucose-Capillary: 181 mg/dL — ABNORMAL HIGH (ref 70–99)
Glucose-Capillary: 204 mg/dL — ABNORMAL HIGH (ref 70–99)

## 2023-12-09 LAB — HEMOGLOBIN AND HEMATOCRIT, BLOOD
HCT: 26 % — ABNORMAL LOW (ref 39.0–52.0)
HCT: 24.2 % — ABNORMAL LOW (ref 39.0–52.0)
Hemoglobin: 8.5 g/dL — ABNORMAL LOW (ref 13.0–17.0)
Hemoglobin: 8 g/dL — ABNORMAL LOW (ref 13.0–17.0)

## 2023-12-09 LAB — TROPONIN I (HIGH SENSITIVITY): Troponin I (High Sensitivity): 1536 ng/L (ref <18)

## 2023-12-09 LAB — PREPARE RBC (CROSSMATCH)

## 2023-12-09 LAB — APTT
aPTT: 114 s — ABNORMAL HIGH (ref 24–36)
aPTT: 35 s (ref 24–36)

## 2023-12-09 LAB — PROTIME-INR
INR: 1.6 — ABNORMAL HIGH (ref 0.8–1.2)
Prothrombin Time: 18.9 s — ABNORMAL HIGH (ref 11.4–15.2)

## 2023-12-09 LAB — MAGNESIUM: Magnesium: 2.3 mg/dL (ref 1.7–2.4)

## 2023-12-09 LAB — HEPARIN LEVEL (UNFRACTIONATED): Heparin Unfractionated: >1.1 [IU]/mL — ABNORMAL HIGH (ref 0.30–0.70)

## 2023-12-09 LAB — LACTIC ACID, PLASMA: Lactic Acid, Venous: 1.3 mmol/L (ref 0.5–1.9)

## 2023-12-09 MED ORDER — HYDROMORPHONE HCL 1 MG/ML IJ SOLN: 1.0000 mg | INTRAMUSCULAR | Status: DC | PRN

## 2023-12-09 MED ORDER — NALOXONE HCL 0.4 MG/ML IJ SOLN: 0.4000 mg | INTRAMUSCULAR | Status: DC | PRN

## 2023-12-09 MED ORDER — HYDROMORPHONE HCL 1 MG/ML IJ SOLN
1.0000 mg | Freq: Once | INTRAMUSCULAR | Status: AC
Start: 2023-12-09 — End: 2023-12-09
  Administered 2023-12-09: 1 mg via INTRAVENOUS
  Filled 2023-12-09: qty 1

## 2023-12-09 MED ORDER — HYDROMORPHONE 1 MG/ML IV SOLN
INTRAVENOUS | Status: DC
  Administered 2023-12-09: 1.4 mg via INTRAVENOUS
  Administered 2023-12-09: 1.6 mg via INTRAVENOUS
  Administered 2023-12-09: 0.6 mg via INTRAVENOUS
  Administered 2023-12-09: 0.3 mg via INTRAVENOUS
  Administered 2023-12-10: 0.9 mg via INTRAVENOUS
  Administered 2023-12-10 (×2): 1.5 mg via INTRAVENOUS
  Administered 2023-12-10: 0.6 mg via INTRAVENOUS
  Administered 2023-12-10 – 2023-12-11 (×2): 0.9 mg via INTRAVENOUS
  Administered 2023-12-11: 1.2 mg via INTRAVENOUS
  Filled 2023-12-09: qty 30

## 2023-12-09 MED ORDER — SODIUM CHLORIDE 0.9% IV SOLUTION: Freq: Once | INTRAVENOUS | Status: AC

## 2023-12-09 MED ORDER — SODIUM CHLORIDE 0.9% FLUSH: 9.0000 mL | INTRAVENOUS | Status: DC | PRN

## 2023-12-09 MED ORDER — FENTANYL CITRATE PF 50 MCG/ML IJ SOSY
25.0000 ug | PREFILLED_SYRINGE | Freq: Once | INTRAMUSCULAR | Status: AC
  Administered 2023-12-09: 25 ug via INTRAVENOUS
  Filled 2023-12-09: qty 1

## 2023-12-09 MED ORDER — FENTANYL CITRATE PF 50 MCG/ML IJ SOSY: 50.0000 ug | PREFILLED_SYRINGE | Freq: Once | INTRAMUSCULAR | Status: DC

## 2023-12-09 MED ORDER — IOHEXOL 350 MG/ML SOLN
75.0000 mL | Freq: Once | INTRAVENOUS | Status: AC | PRN
  Administered 2023-12-09: 75 mL via INTRAVENOUS

## 2023-12-09 MED ORDER — DIPHENHYDRAMINE HCL 50 MG/ML IJ SOLN: 12.5000 mg | Freq: Four times a day (QID) | INTRAMUSCULAR | Status: DC | PRN

## 2023-12-09 MED ORDER — SODIUM CHLORIDE 0.9 % IV SOLN: INTRAVENOUS | Status: DC | PRN

## 2023-12-09 MED ORDER — SODIUM CHLORIDE 0.9 % IV SOLN
20.0000 ug | Freq: Once | INTRAVENOUS | Status: AC
  Administered 2023-12-09: 20 ug via INTRAVENOUS
  Filled 2023-12-09: qty 5

## 2023-12-09 MED ORDER — PROTAMINE SULFATE 10 MG/ML IV SOLN
10.0000 mg | Freq: Once | INTRAVENOUS | Status: AC
  Administered 2023-12-09: 10 mg via INTRAVENOUS
  Filled 2023-12-09: qty 1

## 2023-12-09 MED ORDER — SODIUM CHLORIDE 0.9 % IV BOLUS
500.0000 mL | Freq: Once | INTRAVENOUS | Status: AC
  Administered 2023-12-09: 500 mL via INTRAVENOUS

## 2023-12-09 MED ORDER — DIPHENHYDRAMINE HCL 12.5 MG/5ML PO ELIX: 12.5000 mg | ORAL_SOLUTION | Freq: Four times a day (QID) | ORAL | Status: DC | PRN

## 2023-12-09 MED ORDER — SODIUM ZIRCONIUM CYCLOSILICATE 10 G PO PACK
10.0000 g | PACK | Freq: Three times a day (TID) | ORAL | Status: DC
  Administered 2023-12-10: 10 g via ORAL
  Filled 2023-12-09: qty 1

## 2023-12-09 MED ORDER — FENTANYL CITRATE PF 50 MCG/ML IJ SOSY
25.0000 ug | PREFILLED_SYRINGE | Freq: Once | INTRAMUSCULAR | Status: AC
  Administered 2023-12-09: 25 ug via INTRAVENOUS
  Filled 2023-12-09 (×2): qty 1

## 2023-12-09 MED ORDER — SODIUM CHLORIDE 0.9 % IV SOLN: INTRAVENOUS | Status: AC | PRN

## 2023-12-09 MED ORDER — ACETAMINOPHEN 10 MG/ML IV SOLN
1000.0000 mg | Freq: Four times a day (QID) | INTRAVENOUS | Status: AC | PRN
  Administered 2023-12-09: 1000 mg via INTRAVENOUS
  Filled 2023-12-09: qty 100

## 2023-12-09 MED ORDER — HYDROMORPHONE HCL 1 MG/ML IJ SOLN
INTRAMUSCULAR | Status: AC
  Administered 2023-12-09: 1 mg via INTRAVENOUS
  Filled 2023-12-09: qty 1

## 2023-12-09 NOTE — Progress Notes (Signed)
 SBP sustaining 70s-80s. Nephrology Notified. Gearline Norris, MD order another 500cc bolus. If no response to fluids, will reach out to CCM for vasopressors. K+ improving with CRRT. No lokelma  has been given at this time. Verbal order to hold 2200 lokelma  given by Upton,MD.

## 2023-12-09 NOTE — Progress Notes (Addendum)
 PCCM called to bedside. Patient having severe abd pain. CT abd/pelvis showing large retroperitoneal hematoma. Heparin  and plavix  dc'd. Stat H/H, INR, PT, PTT, and type and screen sent. NPO. Given dilaudid  but still having pain. Fentanyl  and IV tylenol  ordered. Consider dilaudid  pca pump. Surgery being called by [facility].   [name] [name] 12/09/2023, 4:15 AM  Please see [email or web address] for pager details.  From 7A-7P if no response, please call [phone]. After hours, please call [facility] [phone].

## 2023-12-09 NOTE — Progress Notes (Signed)
  [facility] Progress Note   Assessment/ Plan:   1.  Acute kidney injury: Appears to be hemodynamically mediated possibly from a combination of CHF exacerbation +/- sepsis with evolution to ATN.   - Crrt 1/2-1/3  - paused but will restart 12/09/23 in setting of RP bleed  2.  Hyperkalemia:  - corrected - diet education provided to pt and wife - lokelma  daily  3.  Anion gap metabolic acidosis:  - CRRT will correct this  4.  Shock: Appears to be largely cardiogenic but cannot rule out septic component with presentation symptoms suggestive of LRTI.  He has been started on antibiotic coverage for community-acquired pneumonia.  5.  Acute hypoxic respiratory failure: Appears to be from a combination of CHF exacerbation +/- underlying interstitial lung disease and now LRTI/community-acquired pneumonia.  On oxygen supplementation and antimicrobial coverage.  6.  Anemia: Monitor hemoglobin/hematocrit trend anticipating some improvement with diuresis.  7,  RP Bleed:  - per PCCM  - getting plts and blood    Subjective:    Paused CRRT yesterday but developed RP bleed overnight.  [name] dropped, K up to 5.7 this AM.  Will need to restart CRRT    Objective:   BP 112/62   Pulse 91   Temp (!) 94.6 F (34.8 C) (Oral)   Resp 11   Ht 6' (1.829 m)   Wt 108.6 kg   SpO2 100%   BMI 32.47 kg/m   Intake/Output Summary (Last 24 hours) at 12/09/2023 1405 Last data filed at 12/09/2023 1300 Gross per 24 hour  Intake 1636.8 ml  Output 721 ml  Net 915.8 ml   Weight change:   Physical Exam: Hzw:noizm gentleman,better appearing CVS: RRR now Resp: Clear Abd: soft Ext: + multiple bruises, s/p R BKA with L TMT and + ulcer ACCESS: R internal jugular nontunneled HD cath  Imaging: CT ABDOMEN PELVIS W CONTRAST Result Date: 12/09/2023 CLINICAL DATA:  Right lower quadrant abdominal pain EXAM: CT ABDOMEN AND PELVIS WITH CONTRAST TECHNIQUE: Multidetector CT imaging of the abdomen and pelvis was  performed using the standard protocol following bolus administration of intravenous contrast. RADIATION DOSE REDUCTION: This exam was performed according to the departmental dose-optimization program which includes automated exposure control, adjustment of the mA and/or kV according to patient size and/or use of iterative reconstruction technique. CONTRAST:  75mL OMNIPAQUE  IOHEXOL  350 MG/ML SOLN COMPARISON:  CT abdomen and pelvis 04/27/2018 FINDINGS: Lower chest: Peribronchovascular and peripheral ground-glass and reticular opacities compatible with interstitial lung disease. Small bilateral pleural effusions. Coronary artery atherosclerosis.  TAVR. Hepatobiliary: No acute abnormality. Pancreas: Unremarkable. Spleen: Unremarkable. Adrenals/Urinary Tract: Normal adrenal glands. No urinary calculi or hydronephrosis. Foley catheter and gas within the decompressed bladder. Stomach/Bowel: Normal caliber large and small bowel. No bowel wall thickening. Colonic diverticulosis without diverticulitis. Stomach and appendix are within normal limits. Vascular/Lymphatic: Right retroperitoneal hematoma measures 7.4 x 6.2 x 10.2 cm. Foci of hyperintensity on initial phase imaging (series 3/image [id number]) with diffusion on delayed phase images compatible with active hemorrhage. Blood tracks inferiorly along the right psoas. Reproductive: No acute abnormality. Other: No free intraperitoneal air. Musculoskeletal: No acute fracture. Chronic bilateral L5 pars defects. IMPRESSION: 1. Right retroperitoneal hematoma with focus of active hemorrhage. 2. Chronic interstitial lung disease. Small bilateral pleural effusions. Superimposed edema or infection is difficult to exclude. These results were called by telephone at the time of interpretation on 12/09/2023 at 3:53 am to provider ADITYA PALIWAL , who verbally acknowledged these results. Electronically Signed   By:  Norman Gatlin M.D.   On: 12/09/2023 03:54    Labs: BMET Recent Labs   Lab 12/06/23 2310 12/07/23 0116 12/07/23 0500 12/07/23 0949 12/07/23 1554 12/08/23 0355 12/08/23 1609 12/09/23 0348 12/09/23 0929  NA  --  128* 130*  --  132* 134* 135 133* 132*  K 6.2* 6.3* 6.7* 5.7* 4.9 5.2* 4.3 4.9 5.7*  CL  --  94* 96*  --  94* 97* 99 99 97*  CO2  --  15* 17*  --  22 24 26 25 25   GLUCOSE  --  282* 321*  --  163* 170* 113* 186* 215*  BUN  --  122* 123*  --  75* 53* 41* 56* 62*  CREATININE 5.16* 5.29* 5.23*  --  2.72* 1.66* 1.42* 1.61* 1.92*  CALCIUM   --  8.2* 8.2*  --  8.2* 8.5* 8.5* 8.2* 8.1*  PHOS 7.6* 7.3* 6.8*  --  4.2 3.8 3.5 4.7*  --    CBC Recent Labs  Lab 12/06/23 1840 12/06/23 2311 12/07/23 2241 12/08/23 0355 12/09/23 0348 12/09/23 0929  WBC 8.8   < > 13.2* 13.2* 15.1* 12.0*  NEUTROABS 6.7  --   --   --   --   --   HGB 9.5*   < > 9.2* 9.1* 8.3* 7.6*  HCT 30.1*   < > 27.7* 27.3* 25.6* 23.4*  MCV 86.2   < > 81.7 82.5 83.9 84.2  PLT 192   < > 168 178 197 188   < > = values in this interval not displayed.    Medications:     sodium chloride    Intravenous Once   sodium chloride    Intravenous Once   atorvastatin   80 mg Oral Daily   Chlorhexidine  Gluconate Cloth  6 each Topical Daily   dexamethasone  (DECADRON ) injection  6 mg Intravenous Q24H   docusate sodium   100 mg Oral BID   escitalopram   10 mg Oral Daily   feeding supplement (NEPRO CARB STEADY)  237 mL Oral BID BM   gabapentin   300 mg Oral BID   HYDROmorphone    Intravenous Q4H   insulin  aspart  0-20 Units Subcutaneous TID WC   insulin  aspart  0-5 Units Subcutaneous QHS   insulin  glargine-yfgn  10 Units Subcutaneous Daily   multivitamin  1 tablet Oral QHS   pantoprazole   40 mg Oral Daily   sodium zirconium cyclosilicate   10 g Oral TID    Almarie Bonine MD 12/09/2023, 2:05 PM

## 2023-12-09 NOTE — Hospital Course (Addendum)
 Brief Hospital Course: his 83 year old male with significant past medical history including obstructive sleep apnea on CPAP at night, CAD s/p CABG x3 in 2021, aortic stenosis s/p TAVR 08/2023 at [facility], HFpEF, atrial fibrillation on Eliquis , plavix , sick sinus syndrome s/p leadless PPM, hypertension, hyperlipidemia, GERD, uncontrolled diabetes, CKD 3, pulmonary fibrosis followed by Dr. Geronimo, who presented to the emergency department on 12/06/2023 with shortness of breath.  EMS was called and found to be hypotensive and hypoxic. He was started on nonrebreather and an epinephrine  infusion prior to arrival. In the ED afebrile, SBP around 100, tachypneic. Labs +COVID, AKI on CKD with BUN/Cr 116, 4.69 with critical K of 7.1. bicarb 16. CBC without leukocytosis, hgb 9.5. lactic 2.3>1.9, troponin 8,290, UA negative, blood cultures pending. CXR with likely pulmonary edema. With new AKI, hyperkalemia and EKG changes, nephrology was consulted.  He was admitted by CCM for covid pneumonia and AKI, hyperkalemia subsequently underwent CRRT. Subsequently his renal parameters improved. Cardiology also consulted   for NSTEMI, recommended medical management.    Assessment & Plan: Acute on chronic hypoxic respiratory failure, multifactorial: Likely due to COVID+, pulmonary edema due to acute systolic CHF, underlying pulmonary fibrosis. At baseline patient uses 2 L of supplemental oxygen via [place]. Patient presented with cough, shortness of breath x 3 days with no improvement on home nebulizers. Patient was found to be severely hypoxic requiring nonrebreather on arrival. SARS coronavirus 2 PCR positive, influenza A and B by PCR negative. Initial chest x-ray findings concerning for CHF exacerbation. Patient has completed treatment for COVID with remdesivir  and Decadron . Patient has undergone brief CRRT during this hospitalization. Patient with clinical improvement. HD tunneled cath removed per Nephro Was on Lasix .  Now that  the patient appears to be hypovolemic with orthostatic hypotension will be discontinuing the Lasix  for now and recommend to use it either as needed or every other day going forward. Patient has completed over 10 days of isolation.    NSTEMI : Secondary to demand ischemia in the setting of COVID-19 infection with underlying cardiomyopathy. Patient denies any chest pain. Echo showed LVEF 40%,WMA, status post bioprosthetic aortic valve /status post TAVR. Cardiology recommended medical management. Heparin  discontinued due to retroperitoneal bleeding.   Acute retroperitoneal bleed : Ongoing abdominal pain. CTA/P showed retroperitoneal bleeding. Status post transfusion of 1 unit platelets, 2 units PRBCs, thiamine, DDAVP . Anticoagulation for thromboembolic prophylaxis given A-fib on hold due to risk of bleeding. Due to presentation with NSTEMI and cardiac history patient was transfused 2 units PRBCs on 12/12/2023.  Repeat CT A/P (12/12/2023 ), with redemonstrated large right retroperitoneal hematoma with hematocrit level.  Patient reports worsening pain on coughing or deep breathing on the RUQ , concerned for worsening retroperitoneal bleed.  Repeat CT A/P: No significant interval change in right retroperitoneal hematoma. H/H remains stable.  The patient continues to report severe ongoing abdominal pain now new pain reported on the left side. Will repeat another CT scan to monitor for new areas of bleeding. Continue pain control.  Continue cough control as well.   Paroxysmal atrial fibrillation: Rate controlled.  Anticoagulation on hold sec to retroperitoneal bleed.  Recommend outpatient follow up with cardiology to see if he is a candidate for restarting anti coagulation.  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 Follow up outpatient.   Hyperlipidemia Continue Lipitor  80 mg daily.   CAD s/p CABG s/p TAVR Aspirin , and heparin  discontinued.      Acute kidney injury: Prior to admission baseline appears to  be around 1.5-1.7.  Presented with serum creatinine of 4.6. CRRT on 1/2 to 1/3.  Creatinine improved all the way down to 1.26 on 1/13 and now trending up to 1.60. Potassium also fluctuating and improved with Lokelma  initiation as well as renal diet. Hold Lasix .  CT to evaluate for obstruction.   Left chronic foot wound: Wound On the plantar aspect. Wound care on board.    Acute blood loss anemia/ Normocytic anemia/ Anemia of chronic disease: Hemoglobin stable around 10.    Hyponatremia: Improved.    Hyperkalemia : Started on Lokelma  again.   AG metabolic acidosis Resolved.    Shock resolved.  Not largely cardiogenic but cannot rule out a septic component.. Status post dose of antibiotic coverage for community-acquired pneumonia and status post treatment for COVID infection. Shock resolved.   Dysphagia Recommend outpatient follow up with GI    Type 2 DM uncontrolled with hyperglycemia.  Adjusting insulin  regimen on a daily basis.

## 2023-12-09 NOTE — Consult Note (Signed)
 [employer] 1941/03/03  [id number].    Requesting MD: Ria Hemming Chief Complaint/Reason for Consult: retroperitoneal hematoma  HPI:  83 yo male admitted for COVID pneumonia and concern for NSTEMI. He was given plavix , aspirin  and put on heparin  drip for atrial fibrillation. Tonight he developed severe right sided abdominal pain. Ct was done showing 7 x 10 x 6 cm retroperitoneal hematoma with active extravasation. He has not had any hypotension. He has had significant pain requiring IV narcotics  ROS: Review of Systems  Gastrointestinal:  Positive for abdominal pain.  All other systems reviewed and are negative.   Family History  Problem Relation Age of Onset   Diabetes Mellitus II Mother    Emphysema Father 63   Heart attack Father    Colon cancer Neg Hx    Esophageal cancer Neg Hx    Rectal cancer Neg Hx    Stomach cancer Neg Hx     Past Medical History:  Diagnosis Date   Acute blood loss anemia    Anxiety    AV block, Mobitz 1    Cataract    Chronic kidney disease    d/t DM   CKD (chronic kidney disease), stage III (HCC)    COVID-19 virus infection 07/18/2020   Last Assessment & Plan:   Formatting of this note might be different from the original.  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 10/2019 Echo: EF 55-60%, Gr2 DD. Nl RV.    Neuromuscular disorder (HCC)    Neuropathy associated with endocrine disorder (HCC)    Nonobstructive CAD (coronary artery disease)    a. 2012 Cath: mod, nonobs dzs; b.  10/2016 MV: EF 60%, no ischemia.   OSA on CPAP 05/05/2017   Unattended Home Sleep Test 7/2/813-AHI 38.6/hour, desaturation to 64%, body weight 261 pounds   PONV (postoperative nausea and vomiting)    Postoperative anemia due to acute blood loss 11/07/2020   Postoperative hemorrhagic shock 03/20/2021   Sleep apnea     uses cpap asked to bring mask and tubing    Past Surgical History:  Procedure Laterality Date   ABDOMINAL AORTOGRAM W/LOWER EXTREMITY N/A 12/10/2020   Procedure: ABDOMINAL AORTOGRAM W/LOWER EXTREMITY;  Surgeon: Magda Debby SAILOR, MD;  [facility]: [facility] INV[facility] LAB;  Service: Cardiovascular;  Laterality: N/A;   AMPUTATION Right 01/22/2021   Procedure: 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;  Surgeon: Harden Jerona GAILS, MD;  Location: [facility] OR;  Service: Orthopedics;  Laterality: Right;   AMPUTATION Right 03/17/2021   Procedure: RIGHT BELOW KNEE AMPUTATION;  Surgeon: Harden Jerona GAILS, MD;  Location: [facility] OR;  Service: Orthopedics;  Laterality: Right;   ANKLE FUSION Right 01/22/2021   Procedure: RIGHT FOOT TIBIOCALCANEAL FUSION;  Surgeon: Harden Jerona GAILS, MD;  Location: [facility] OR;  Service: Orthopedics;  Laterality: Right;   ANTERIOR FUSION CERVICAL SPINE  2012   CARDIAC CATHETERIZATION  2011   CARDIAC CATHETERIZATION N/A 11/09/2016   Procedure: Right  Heart Cath;  Surgeon: Victory LELON Sharps, MD;  Location: [facility] INVASIVE CV LAB;  Service: Cardiovascular;  Laterality: N/A;   carpel tunnel     left wrist   CATARACT EXTRACTION     CATARACT EXTRACTION W/ INTRAOCULAR LENS  IMPLANT, BILATERAL  2013   CERVICAL LAMINECTOMY  2012   COLONOSCOPY N/A 01/14/2013   Procedure: COLONOSCOPY;  Surgeon: Norleen LOISE Kiang, MD;  Location: [facility] ENDOSCOPY;  Service: Endoscopy;  Laterality: N/A;   CORONARY ARTERY BYPASS GRAFT  11/04/2020   LIMA-LAD, SVG-OM1, SVG-PDA (Dr Norleen Exon [facility]) dc 11/18/2020   EYE SURGERY     I & D EXTREMITY Right 02/19/2021   Procedure: RIGHT ANKLE DEBRIDEMENT AND PLACEMENT ANTIBIOTIC BEADS;  Surgeon: Harden Jerona GAILS, MD;   Location: [facility] OR;  Service: Orthopedics;  Laterality: Right;   KNEE SURGERY  1998   left   LEFT HEART CATH AND CORONARY ANGIOGRAPHY N/A 07/10/2020   Procedure: LEFT HEART CATH AND CORONARY ANGIOGRAPHY;  Surgeon: Wonda Sharper, MD;  Location: [facility] INVASIVE CV LAB;  Service: Cardiovascular;  Laterality: N/A;   LUMBAR LAMINECTOMY  2003   LUNG BIOPSY Left 12/26/2016   Procedure: LUNG BIOPSY;  Surgeon: Elspeth JAYSON Millers, MD;  Location: [facility] OR;  Service: Thoracic;  Laterality: Left;   PACEMAKER IMPLANT N/A 03/30/2020   Procedure: PACEMAKER IMPLANT;  Surgeon: Waddell Danelle LELON, MD;  Location: [facility];  Service: Cardiovascular;  Laterality: N/A;   PERIPHERAL VASCULAR INTERVENTION Right 12/10/2020   Procedure: PERIPHERAL VASCULAR INTERVENTION;  Surgeon: Magda Debby LOISE, MD;  [facility]: [facility];  Service: Cardiovascular;  Laterality: Right;  SFA   POSTERIOR FUSION CERVICAL SPINE  2012   PPM GENERATOR REMOVAL N/A 12/14/2020   Procedure: PPM GENERATOR REMOVAL;  Surgeon: Waddell Danelle LELON, MD;  [facility]: [facility];  Service: Cardiovascular;  Laterality: N/A;   TEE WITHOUT CARDIOVERSION N/A 12/11/2020   Procedure: TRANSESOPHAGEAL ECHOCARDIOGRAM (TEE);  Surgeon: Barbaraann Darryle Debby, MD;  Location: [facility] ENDOSCOPY;  Service: Cardiovascular;  Laterality: N/A;   TRIGGER FINGER RELEASE  2011   4th finger left hand   VIDEO ASSISTED THORACOSCOPY Left 12/26/2016   Procedure: VIDEO ASSISTED THORACOSCOPY;  Surgeon: Elspeth JAYSON Millers, MD;  Location: [facility] OR;  Service: Thoracic;  Laterality: Left;   VIDEO BRONCHOSCOPY N/A 12/26/2016   Procedure: VIDEO BRONCHOSCOPY;  Surgeon: Elspeth JAYSON Millers, MD;  Location: [facility] OR;  Service: Thoracic;  Laterality: N/A;    Social History:  reports that he has never smoked. He has never used smokeless tobacco. He reports that he does not drink alcohol and does not use drugs.  Allergies:  Allergies  Allergen Reactions   Codeine Hives and Itching   Ofev   [Nintedanib ] Diarrhea    SEVERE DIARRHEA   Pirfenidone  Diarrhea and Other (See Comments)    Esbriet  (Pirfenidone ) causes elevated LFTs. D/C on 06/14/17 and SEVERE DIARRHEA     Medications Prior to Admission  Medication Sig Dispense Refill   albuterol  (PROVENTIL ) (2.5 MG/3ML) 0.083% nebulizer solution Take 2.5 mg by nebulization 2 (two) times daily as needed for wheezing or shortness of breath.     albuterol  (VENTOLIN  HFA) 108 (90 Base) MCG/ACT inhaler Inhale 2 puffs into the lungs every 4 (four) hours as needed for wheezing or shortness of breath. (Patient taking differently: Inhale 3 puffs into the lungs every 4 (four) hours as needed for wheezing or shortness of breath.) 6.7 g 0   apixaban  (ELIQUIS ) 5 MG TABS tablet Take 1 tablet (5 mg total)  by mouth 2 (two) times daily. (Patient taking differently: Take 2.5 mg by mouth 2 (two) times daily.) 60 tablet 11   Ascorbic Acid  (VITAMIN C  WITH ROSE HIPS) 500 MG tablet Take 1 tablet (500 mg total) by mouth daily. 30 tablet 0   atorvastatin  (LIPITOR ) 80 MG tablet TAKE 1 TABLET BY MOUTH EVERY DAY (Patient taking differently: Take 80 mg by mouth at bedtime.) 90 tablet 3   clopidogrel  (PLAVIX ) 75 MG tablet Take 75 mg by mouth daily.     diphenhydrAMINE  HCl, Sleep, (ZZZQUIL PO) Take 15 mLs by mouth at bedtime as needed (cough).     docusate sodium  (COLACE) 100 MG capsule Take 1 capsule (100 mg total) by mouth daily. (Patient taking differently: Take 100 mg by mouth as needed for mild constipation or moderate constipation.) 10 capsule 0   escitalopram  (LEXAPRO ) 10 MG tablet Take 1 tablet (10 mg total) by mouth daily. 30 tablet 0   FARXIGA  10 MG TABS tablet Take 10 mg by mouth daily.     furosemide  (LASIX ) 20 MG tablet Take 3 tablets (60 mg total) by mouth daily. (Patient taking differently: Take 20-40 mg by mouth See admin instructions. Take 2 tablets by mouth in the morning and 1 tablet at night) 90 tablet 0   HUMALOG  KWIKPEN 100 UNIT/ML KwikPen Inject 10-15  Units into the skin 3 (three) times daily. Depending what his blood sugar reading is     HYDROMET 5-1.5 MG/5ML syrup Take 5 mLs by mouth as needed for cough.     lisinopril  (ZESTRIL ) 10 MG tablet Take 10 mg by mouth daily.     LORazepam  (ATIVAN ) 1 MG tablet Take 1 tablet (1 mg total) by mouth at bedtime. (Patient taking differently: Take 1 mg by mouth at bedtime as needed (sleep).) 30 tablet 0   metoprolol  tartrate (LOPRESSOR ) 25 MG tablet Take 25 mg by mouth daily.     Multiple Vitamin (MULTIVITAMIN WITH MINERALS) TABS tablet Take 1 tablet by mouth daily.     mupirocin  ointment (BACTROBAN ) 2 % Apply 1 Application topically at bedtime.     nitroGLYCERIN  (NITRODUR - DOSED IN MG/24 HR) 0.2 mg/hr patch Place 1 patch (0.2 mg total) onto the skin daily. 30 patch 12   Olopatadine HCl (PATADAY OP) Place 2 drops into both eyes daily as needed (allergies).     ondansetron  (ZOFRAN ) 4 MG tablet Take 4 mg by mouth as needed for nausea or vomiting.     pantoprazole  (PROTONIX ) 40 MG tablet Take 1 tablet (40 mg total) by mouth 2 (two) times daily. (Patient taking differently: Take 40 mg by mouth daily.) 60 tablet 0   polyethylene glycol powder (GLYCOLAX /MIRALAX ) 17 GM/SCOOP powder Take 17 g by mouth every other day.     traZODone  (DESYREL ) 50 MG tablet Take 75 mg by mouth at bedtime as needed for sleep.     TRESIBA  FLEXTOUCH 100 UNIT/ML FlexTouch Pen Inject 20 Units into the skin daily.     zinc  sulfate 220 (50 Zn) MG capsule Take 1 capsule (220 mg total) by mouth daily. 30 capsule 0   gabapentin  (NEURONTIN ) 300 MG capsule TAKE 1 CAPSULE BY MOUTH THREE TIMES A DAY (Patient taking differently: Take 300 mg by mouth 2 (two) times daily.) 180 capsule 3    Physical Exam: Blood pressure (!) 129/57, pulse 73, temperature 97.9 F (36.6 C), temperature source Oral, resp. rate 14, height 6' (1.829 m), weight 108.6 kg, SpO2 100%. Gen: NAD Resp: nonlabored CV: RRR Abd: soft,  tender to palpation right lateral  area Neuro: AOx4  Results for orders placed or performed during the hospital encounter of 12/06/23 (from the past 48 hours)  Glucose, capillary     Status: Abnormal   Collection Time: 12/07/23  8:02 AM  Result Value Ref Range   Glucose-Capillary 269 (H) 70 - 99 mg/dL    Comment: Glucose reference range applies only to samples taken after fasting for at least 8 hours.  APTT     Status: Abnormal   Collection Time: 12/07/23  9:49 AM  Result Value Ref Range   aPTT >200 (HH) 24 - 36 seconds    Comment:        IF BASELINE aPTT IS ELEVATED, SUGGEST PATIENT RISK ASSESSMENT BE USED TO DETERMINE APPROPRIATE ANTICOAGULANT THERAPY. REPEATED TO VERIFY CRITICAL RESULT CALLED TO, READ BACK BY AND VERIFIED WITH: K.JENKINS,RN [id number] 12/07/23 CLARK,S Performed at [facility] Lab, [address], [place], [place] [address]   Potassium     Status: Abnormal   Collection Time: 12/07/23  9:49 AM  Result Value Ref Range   Potassium 5.7 (H) 3.5 - 5.1 mmol/L    Comment: Performed at [facility] Lab, [address] [place], [place] [address]  APTT     Status: Abnormal   Collection Time: 12/07/23 11:19 AM  Result Value Ref Range   aPTT >200 (HH) 24 - 36 seconds    Comment:        IF BASELINE aPTT IS ELEVATED, SUGGEST PATIENT RISK ASSESSMENT BE USED TO DETERMINE APPROPRIATE ANTICOAGULANT THERAPY. REPEATED TO VERIFY CRITICAL RESULT CALLED TO, READ BACK BY AND VERIFIED WITH: A.ELSON PEAK [id number] 12/07/23 CLARK,S Performed at [facility] Lab, [address], [place], [place] [address]   Heparin  level (unfractionated)     Status: Abnormal   Collection Time: 12/07/23 11:19 AM  Result Value Ref Range   Heparin  Unfractionated >1.10 (H) 0.30 - 0.70 IU/mL    Comment: (NOTE) The clinical reportable range upper limit is being lowered to >1.10 to align with the FDA approved guidance for the current laboratory assay.  If heparin  results are below expected values, and patient dosage has  been confirmed,  suggest follow up testing of antithrombin III levels. Performed at [facility] Lab, [address] [place], [place] [address]   Glucose, capillary     Status: Abnormal   Collection Time: 12/07/23 11:23 AM  Result Value Ref Range   Glucose-Capillary 223 (H) 70 - 99 mg/dL    Comment: Glucose reference range applies only to samples taken after fasting for at least 8 hours.  Glucose, capillary     Status: Abnormal   Collection Time: 12/07/23  3:52 PM  Result Value Ref Range   Glucose-Capillary 160 (H) 70 - 99 mg/dL    Comment: Glucose reference range applies only to samples taken after fasting for at least 8 hours.  Renal function panel (daily at 1600)     Status: Abnormal   Collection Time: 12/07/23  3:54 PM  Result Value Ref Range   Sodium 132 (L) 135 - 145 mmol/L   Potassium 4.9 3.5 - 5.1 mmol/L   Chloride 94 (L) 98 - 111 mmol/L   CO2 22 22 - 32 mmol/L   Glucose, Bld 163 (H) 70 - 99 mg/dL    Comment: Glucose reference range applies only to samples taken after fasting for at least 8 hours.   BUN 75 (H) 8 - 23 mg/dL   Creatinine, Ser 7.27 (H)  0.61 - 1.24 mg/dL   Calcium  8.2 (L) 8.9 - 10.3 mg/dL   Phosphorus 4.2 2.5 - 4.6 mg/dL   Albumin  2.7 (L) 3.5 - 5.0 g/dL   GFR, Estimated 23 (L) >60 mL/min    Comment: (NOTE) Calculated using the CKD-EPI Creatinine Equation (2021)    Anion gap 16 (H) 5 - 15    Comment: Performed at [facility] Lab, [address], [place], [place] [address]  Glucose, capillary     Status: Abnormal   Collection Time: 12/07/23  7:35 PM  Result Value Ref Range   Glucose-Capillary 122 (H) 70 - 99 mg/dL    Comment: Glucose reference range applies only to samples taken after fasting for at least 8 hours.  APTT     Status: Abnormal   Collection Time: 12/07/23 10:41 PM  Result Value Ref Range   aPTT 133 (H) 24 - 36 seconds    Comment:        IF BASELINE aPTT IS ELEVATED, SUGGEST PATIENT RISK ASSESSMENT BE USED TO DETERMINE APPROPRIATE ANTICOAGULANT  THERAPY. Performed at [facility] Lab, [address] [place], [place] [address]   CBC     Status: Abnormal   Collection Time: 12/07/23 10:41 PM  Result Value Ref Range   WBC 13.2 (H) 4.0 - 10.5 K/uL   RBC 3.39 (L) 4.22 - 5.81 MIL/uL   Hemoglobin 9.2 (L) 13.0 - 17.0 g/dL   HCT 72.2 (L) 60.9 - 47.9 %   MCV 81.7 80.0 - 100.0 fL   MCH 27.1 26.0 - 34.0 pg   MCHC 33.2 30.0 - 36.0 g/dL   RDW 81.0 (H) 88.4 - 84.4 %   Platelets 168 150 - 400 K/uL   nRBC 0.0 0.0 - 0.2 %    Comment: Performed at [facility] Lab, [address] [place], [place] [address]  Glucose, capillary     Status: Abnormal   Collection Time: 12/07/23 11:27 PM  Result Value Ref Range   Glucose-Capillary 121 (H) 70 - 99 mg/dL    Comment: Glucose reference range applies only to samples taken after fasting for at least 8 hours.  Renal function panel (daily at 0500)     Status: Abnormal   Collection Time: 12/08/23  3:55 AM  Result Value Ref Range   Sodium 134 (L) 135 - 145 mmol/L   Potassium 5.2 (H) 3.5 - 5.1 mmol/L   Chloride 97 (L) 98 - 111 mmol/L   CO2 24 22 - 32 mmol/L   Glucose, Bld 170 (H) 70 - 99 mg/dL    Comment: Glucose reference range applies only to samples taken after fasting for at least 8 hours.   BUN 53 (H) 8 - 23 mg/dL   Creatinine, Ser 8.33 (H) 0.61 - 1.24 mg/dL    Comment: DELTA CHECK NOTED   Calcium  8.5 (L) 8.9 - 10.3 mg/dL   Phosphorus 3.8 2.5 - 4.6 mg/dL   Albumin  2.7 (L) 3.5 - 5.0 g/dL   GFR, Estimated 41 (L) >60 mL/min    Comment: (NOTE) Calculated using the CKD-EPI Creatinine Equation (2021)    Anion gap 13 5 - 15    Comment: Performed at [facility] Lab, [address] [place], [place] [address]  C-reactive protein     Status: Abnormal   Collection Time: 12/08/23  3:55 AM  Result Value Ref Range   CRP 9.0 (H) <1.0 mg/dL    Comment: Performed at [facility], [address]  [place], [place] [address]  Heparin  level (unfractionated)     Status: Abnormal   Collection  Time: 12/08/23  3:55 AM  Result Value Ref Range   Heparin  Unfractionated >1.10 (H) 0.30 - 0.70 IU/mL    Comment: (NOTE) The clinical reportable range upper limit is being lowered to >1.10 to align with the FDA approved guidance for the current laboratory assay.  If heparin  results are below expected values, and patient dosage has  been confirmed, suggest follow up testing of antithrombin III levels. Performed at [facility], [address] [place], [place] [address]   CBC     Status: Abnormal   Collection Time: 12/08/23  3:55 AM  Result Value Ref Range   WBC 13.2 (H) 4.0 - 10.5 K/uL   RBC 3.31 (L) 4.22 - 5.81 MIL/uL   Hemoglobin 9.1 (L) 13.0 - 17.0 g/dL   HCT 72.6 (L) 60.9 - 47.9 %   MCV 82.5 80.0 - 100.0 fL   MCH 27.5 26.0 - 34.0 pg   MCHC 33.3 30.0 - 36.0 g/dL   RDW 80.9 (H) 88.4 - 84.4 %   Platelets 178 150 - 400 K/uL   nRBC 0.0 0.0 - 0.2 %    Comment: Performed at [facility], [address], [place], [place] [address]  Magnesium      Status: None   Collection Time: 12/08/23  3:55 AM  Result Value Ref Range   Magnesium  2.2 1.7 - 2.4 mg/dL    Comment: Performed at [facility], [address], [place], [place] [address]  Glucose, capillary     Status: Abnormal   Collection Time: 12/08/23  7:34 AM  Result Value Ref Range   Glucose-Capillary 178 (H) 70 - 99 mg/dL    Comment: Glucose reference range applies only to samples taken after fasting for at least 8 hours.  APTT     Status: Abnormal   Collection Time: 12/08/23  9:01 AM  Result Value Ref Range   aPTT 94 (H) 24 - 36 seconds    Comment:        IF BASELINE aPTT IS ELEVATED, SUGGEST PATIENT RISK ASSESSMENT BE USED TO DETERMINE APPROPRIATE ANTICOAGULANT THERAPY. Performed at [facility], [address] [place], [place] [address]   Glucose, capillary     Status: Abnormal   Collection Time: 12/08/23 11:57 AM  Result Value Ref Range   Glucose-Capillary 146 (H) 70 - 99 mg/dL    Comment:  Glucose reference range applies only to samples taken after fasting for at least 8 hours.  Renal function panel (daily at 1600)     Status: Abnormal   Collection Time: 12/08/23  4:09 PM  Result Value Ref Range   Sodium 135 135 - 145 mmol/L   Potassium 4.3 3.5 - 5.1 mmol/L   Chloride 99 98 - 111 mmol/L   CO2 26 22 - 32 mmol/L   Glucose, Bld 113 (H) 70 - 99 mg/dL    Comment: Glucose reference range applies only to samples taken after fasting for at least 8 hours.   BUN 41 (H) 8 - 23 mg/dL   Creatinine, Ser 8.57 (H) 0.61 - 1.24 mg/dL   Calcium  8.5 (L) 8.9 - 10.3 mg/dL   Phosphorus 3.5 2.5 - 4.6 mg/dL   Albumin  2.8 (L) 3.5 - 5.0 g/dL   GFR, Estimated 49 (L) >60 mL/min    Comment: (NOTE) Calculated using the CKD-EPI Creatinine Equation (2021)    Anion gap 10  5 - 15    Comment: Performed at [facility], [address], [place], [place] [address]  APTT     Status: Abnormal   Collection Time: 12/08/23  4:09 PM  Result Value Ref Range   aPTT 86 (H) 24 - 36 seconds    Comment:        IF BASELINE aPTT IS ELEVATED, SUGGEST PATIENT RISK ASSESSMENT BE USED TO DETERMINE APPROPRIATE ANTICOAGULANT THERAPY. Performed at [facility], [address], [place], [place] [address]   Glucose, capillary     Status: Abnormal   Collection Time: 12/08/23  4:35 PM  Result Value Ref Range   Glucose-Capillary 109 (H) 70 - 99 mg/dL    Comment: Glucose reference range applies only to samples taken after fasting for at least 8 hours.  Glucose, capillary     Status: Abnormal   Collection Time: 12/08/23  9:13 PM  Result Value Ref Range   Glucose-Capillary 138 (H) 70 - 99 mg/dL    Comment: Glucose reference range applies only to samples taken after fasting for at least 8 hours.  Renal function panel (daily at 0500)     Status: Abnormal   Collection Time: 12/09/23  3:48 AM  Result Value Ref Range   Sodium 133 (L) 135 - 145 mmol/L   Potassium 4.9 3.5 - 5.1 mmol/L   Chloride 99 98 - 111 mmol/L    CO2 25 22 - 32 mmol/L   Glucose, Bld 186 (H) 70 - 99 mg/dL    Comment: Glucose reference range applies only to samples taken after fasting for at least 8 hours.   BUN 56 (H) 8 - 23 mg/dL   Creatinine, Ser 8.38 (H) 0.61 - 1.24 mg/dL   Calcium  8.2 (L) 8.9 - 10.3 mg/dL   Phosphorus 4.7 (H) 2.5 - 4.6 mg/dL   Albumin  2.6 (L) 3.5 - 5.0 g/dL   GFR, Estimated 42 (L) >60 mL/min    Comment: (NOTE) Calculated using the CKD-EPI Creatinine Equation (2021)    Anion gap 9 5 - 15    Comment: Performed at [facility], [address] [place], [place] [address]  Magnesium      Status: None   Collection Time: 12/09/23  3:48 AM  Result Value Ref Range   Magnesium  2.3 1.7 - 2.4 mg/dL    Comment: Performed at [facility], [address] [place], [place] [address]  Heparin  level (unfractionated)     Status: Abnormal   Collection Time: 12/09/23  3:48 AM  Result Value Ref Range   Heparin  Unfractionated >1.10 (H) 0.30 - 0.70 IU/mL    Comment: (NOTE) The clinical reportable range upper limit is being lowered to >1.10 to align with the FDA approved guidance for the current laboratory assay.  If heparin  results are below expected values, and patient dosage has  been confirmed, suggest follow up testing of antithrombin III levels. Performed at [facility], [address], [place], [place] [address]   APTT     Status: Abnormal   Collection Time: 12/09/23  3:48 AM  Result Value Ref Range   aPTT 114 (H) 24 - 36 seconds    Comment:        IF BASELINE aPTT IS ELEVATED, SUGGEST PATIENT RISK ASSESSMENT BE USED TO DETERMINE APPROPRIATE ANTICOAGULANT THERAPY. Performed at [facility], [address] [place], [place] [address]   CBC     Status: Abnormal   Collection Time: 12/09/23  3:48 AM  Result Value Ref Range   WBC 15.1 (H) 4.0 - 10.5 K/uL   RBC 3.05 (L) 4.22 - 5.81 MIL/uL   Hemoglobin 8.3 (L) 13.0 - 17.0 g/dL   HCT 74.3 (L) 60.9 - 47.9 %   MCV 83.9 80.0 - 100.0 fL   MCH  27.2 26.0 - 34.0 pg   MCHC 32.4 30.0 - 36.0 g/dL   RDW 80.7 (H) 88.4 - 84.4 %   Platelets 197 150 - 400 K/uL   nRBC 0.1 0.0 - 0.2 %    Comment: Performed at [facility], [address], [place], [place] [address]  Type and screen [facility]     Status: None   Collection Time: 12/09/23  3:55 AM  Result Value Ref Range   ABO/RH(D) O POS    Antibody Screen NEG    Sample Expiration      12/12/2023,2359 Performed at [facility] Lab, [address] [place], [place] [address]   Prepare platelet pheresis     Status: None (Preliminary result)   Collection Time: 12/09/23  4:08 AM  Result Value Ref Range   Unit Number [id number]    Blood Component Type PLTP1 PSORALEN TREATED    Unit division 00    Status of Unit ISSUED    Transfusion Status      OK TO TRANSFUSE Performed at [facility], [address], [place], [place] [address]    *Note: Due to a large number of results and/or encounters for the requested time period, some results have not been displayed. A complete set of results can be found in Results Review.   CT ABDOMEN PELVIS W CONTRAST Result Date: 12/09/2023 CLINICAL DATA:  Right lower quadrant abdominal pain EXAM: CT ABDOMEN AND PELVIS WITH CONTRAST TECHNIQUE: Multidetector CT imaging of the abdomen and pelvis was performed using the standard protocol following bolus administration of intravenous contrast. RADIATION DOSE REDUCTION: This exam was performed according to the departmental dose-optimization program which includes automated exposure control, adjustment of the mA and/or kV according to patient size and/or use of iterative reconstruction technique. CONTRAST:  75mL OMNIPAQUE  IOHEXOL  350 MG/ML SOLN COMPARISON:  CT abdomen and pelvis 04/27/2018 FINDINGS: Lower chest: Peribronchovascular and peripheral ground-glass and reticular opacities compatible with interstitial lung disease. Small bilateral pleural effusions. Coronary artery  atherosclerosis.  TAVR. Hepatobiliary: No acute abnormality. Pancreas: Unremarkable. Spleen: Unremarkable. Adrenals/Urinary Tract: Normal adrenal glands. No urinary calculi or hydronephrosis. Foley catheter and gas within the decompressed bladder. Stomach/Bowel: Normal caliber large and small bowel. No bowel wall thickening. Colonic diverticulosis without diverticulitis. Stomach and appendix are within normal limits. Vascular/Lymphatic: Right retroperitoneal hematoma measures 7.4 x 6.2 x 10.2 cm. Foci of hyperintensity on initial phase imaging (series 3/image 58-60) with diffusion on delayed phase images compatible with active hemorrhage. Blood tracks inferiorly along the right psoas. Reproductive: No acute abnormality. Other: No free intraperitoneal air. Musculoskeletal: No acute fracture. Chronic bilateral L5 pars defects. IMPRESSION: 1. Right retroperitoneal hematoma with focus of active hemorrhage. 2. Chronic interstitial lung disease. Small bilateral pleural effusions. Superimposed edema or infection is difficult to exclude. These results were called by telephone at the time of interpretation on 12/09/2023 at 3:53 am to provider ADITYA PALIWAL , who verbally acknowledged these results. Electronically Signed   By: Norman Gatlin M.D.   On: 12/09/2023 03:54   ECHOCARDIOGRAM COMPLETE Result Date: 12/07/2023    ECHOCARDIOGRAM REPORT   Patient Name:   Shane [name] Date of  Exam: 12/07/2023 Medical Rec #:  [id number]        Height:       72.0 in Accession #:    [id number]       Weight:       241.2 lb Date of Birth:  13-Mar-1941        BSA:          2.307 m Patient Age:    82 years         BP:           120/54 mmHg Patient Gender: M                HR:           75 bpm. Exam Location:  Inpatient Procedure: 2D Echo, Color Doppler, Cardiac Doppler and Intracardiac            Opacification Agent STAT ECHO Indications:    NSTEMI  History:        Patient has prior history of Echocardiogram examinations, most                  recent 06/22/2023. CHF, Pacemaker and Prior CABG, chronic kidney                 disease. Covid., Arrythmias:RBBB; Risk Factors:Sleep Apnea,                 Diabetes and Hypertension.                 Aortic Valve: 29 mm Edwards Sapien prosthetic, stented (TAVR)                 valve is present in the aortic position. Procedure Date:                 08/16/23.  Sonographer:    Tinnie Barefoot RDCS Referring Phys: [id number] FONDA JAYSON SHARPS  Sonographer Comments: Technically difficult study due to poor echo windows. Image acquisition challenging due to patient body habitus. IMPRESSIONS  1. Left ventricular ejection fraction, by estimation, is 40%. The left ventricle has mild to moderately decreased function. The left ventricle demonstrates regional wall motion abnormalities with septal and inferior severe hypokinesis. Even with Definity  contrast, images were poor. Left ventricular diastolic parameters are indeterminate.  2. Peak RV-RA gradient 33 mmHg. IVC not visualized. Right ventricular systolic function is moderately reduced. The right ventricular size is normal.  3. Left atrial size was moderately dilated.  4. Right atrial size was mildly dilated.  5. The mitral valve is normal in structure. Mild to moderate mitral valve regurgitation. No evidence of mitral stenosis.  6. Bioprosthetic aortic valve s/p TAVR with 29 mm Edwards Sapien THV. Mean gradient 8 mmHg, EOA 1.57 cm^2 with dimensionless index 0.46. No peri-valvular leakage noted. The aortic valve has been repaired/replaced. Aortic valve regurgitation is not visualized.  7. Aortic dilatation noted. There is mild dilatation of the ascending aorta, measuring 39 mm. FINDINGS  Left Ventricle: Left ventricular ejection fraction, by estimation, is 40%. The left ventricle has mild to moderately decreased function. The left ventricle demonstrates regional wall motion abnormalities. Definity  contrast agent was given IV to delineate the left ventricular endocardial borders.  The left ventricular internal cavity size was normal in size. There is no left ventricular hypertrophy. Left ventricular diastolic parameters are indeterminate. Right Ventricle: Peak RV-RA gradient 33 mmHg. IVC not visualized. The right ventricular size is normal. No increase in right ventricular wall thickness. Right ventricular systolic function is moderately  reduced. Left Atrium: Left atrial size was moderately dilated. Right Atrium: Right atrial size was mildly dilated. Pericardium: There is no evidence of pericardial effusion. Mitral Valve: The mitral valve is normal in structure. Mild mitral annular calcification. Mild to moderate mitral valve regurgitation. No evidence of mitral valve stenosis. Tricuspid Valve: The tricuspid valve is normal in structure. Tricuspid valve regurgitation is trivial. Aortic Valve: Bioprosthetic aortic valve s/p TAVR with 29 mm Edwards Sapien THV. Mean gradient 8 mmHg, EOA 1.57 cm^2 with dimensionless index 0.46. No peri-valvular leakage noted. The aortic valve has been repaired/replaced. Aortic valve regurgitation is  not visualized. Aortic valve mean gradient measures 8.0 mmHg. Aortic valve peak gradient measures 13.8 mmHg. Aortic valve area, by VTI measures 1.57 cm. There is a 29 mm Edwards Sapien prosthetic, stented (TAVR) valve present in the aortic position. Procedure Date: 08/16/23. Pulmonic Valve: The pulmonic valve was normal in structure. Pulmonic valve regurgitation is trivial. Aorta: Aortic dilatation noted. There is mild dilatation of the ascending aorta, measuring 39 mm. IAS/Shunts: No atrial level shunt detected by color flow Doppler.  LEFT VENTRICLE PLAX 2D LVIDd:         5.40 cm LVIDs:         4.00 cm LV PW:         1.10 cm LV IVS:        1.10 cm LVOT diam:     2.20 cm LV SV:         64 LV SV Index:   28 LVOT Area:     3.80 cm  RIGHT VENTRICLE RV Basal diam:  2.90 cm RV S prime:     6.64 cm/s TAPSE (M-mode): 0.9 cm LEFT ATRIUM              Index        RIGHT  ATRIUM           Index LA diam:        4.80 cm  2.08 cm/m   RA Area:     21.30 cm LA Vol (A2C):   110.0 ml 47.68 ml/m  RA Volume:   58.90 ml  25.53 ml/m LA Vol (A4C):   117.0 ml 50.71 ml/m LA Biplane Vol: 121.0 ml 52.45 ml/m  AORTIC VALVE AV Area (Vmax):    1.59 cm AV Area (Vmean):   1.51 cm AV Area (VTI):     1.57 cm AV Vmax:           186.00 cm/s AV Vmean:          127.000 cm/s AV VTI:            0.410 m AV Peak Grad:      13.8 mmHg AV Mean Grad:      8.0 mmHg LVOT Vmax:         77.80 cm/s LVOT Vmean:        50.450 cm/s LVOT VTI:          0.169 m LVOT/AV VTI ratio: 0.41  AORTA Ao Root diam: 3.80 cm Ao Asc diam:  3.90 cm TRICUSPID VALVE TR Peak grad:   32.9 mmHg TR Vmax:        287.00 cm/s  SHUNTS Systemic VTI:  0.17 m Systemic Diam: 2.20 cm Dalton McleanMD Electronically signed by Ezra Kanner Signature Date/Time: 12/07/2023/9:49:35 AM    Final     Assessment/Plan 83 yo male with anticoagulation related retroperitoneal hematoma -protamine  and platelets give -heparin , plavix , and aspirin  stopped -will discuss case with  IR for possible embolization  FEN - NPo VTE - hold due to bleeding ID - received remdesivir  Admit - ICU  I reviewed last 24 h vitals and pain scores, last 48 h intake and output, last 24 h labs and trends, and last 24 h imaging results.  Herlene Righter [facility] 12/09/2023, 5:29 AM Please see Amion for pager number during day hours 7:00am-4:30pm or 7:00am -11:30am on weekends

## 2023-12-09 NOTE — Consult Note (Signed)
 Reason for Consult: Dysphagia Referring Physician: CCM  Lynwood Marsa Sic HPI: This is an 83 year old male with a PMH of CAD s/p CABG 3 years ago, TAVR for AS, DM, PVD s/p right BKA, IPF, CHF, sick sinus syndrome, OSA, and CKD admitted originally for hypotension and SOB.  The work up showed a possible NSTEMI, but most likely CHF exacerbation, along with COVID.  With treatment and supportive care he improved and he was to be transferred from the unit today, however, overnight he developed a retroperitoneal bleed in the right psoas muscle, while on anticoagulation.  Surgery and IR evaluated the patient and the plan is to monitor closely and to hold on anticoagulation.  Prior to this acute event he complained about intermittent solid > liquid dysphagia.  The symptoms started after he had his CABG 3 years ago and over the past year he routinely uses Alka Seltzer.  He thinks he has some issues with GERD, but he denies taking any PPIs or H2RAs.  Past Medical History:  Diagnosis Date   Acute blood loss anemia    Anxiety    AV block, Mobitz 1    Cataract    Chronic kidney disease    d/t DM   CKD (chronic kidney disease), stage III (HCC)    COVID-19 virus infection 07/18/2020   Last Assessment & Plan:   Formatting of this note might be different from the original.  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 10/2019 Echo: EF 55-60%, Gr2 DD. Nl RV.    Neuromuscular disorder (HCC)    Neuropathy associated with endocrine disorder (HCC)     Nonobstructive CAD (coronary artery disease)    a. 2012 Cath: mod, nonobs dzs; b. 10/2016 MV: EF 60%, no ischemia.   OSA on CPAP 05/05/2017   Unattended Home Sleep Test 7/2/813-AHI 38.6/hour, desaturation to 64%, body weight 261 pounds   PONV (postoperative nausea and vomiting)    Postoperative anemia due to acute blood loss 11/07/2020   Postoperative hemorrhagic shock 03/20/2021   Sleep apnea     uses cpap asked to bring mask and tubing    Past Surgical History:  Procedure Laterality Date   ABDOMINAL AORTOGRAM W/LOWER EXTREMITY N/A 12/10/2020   Procedure: ABDOMINAL AORTOGRAM W/LOWER EXTREMITY;  Surgeon: Magda Debby SAILOR, MD;  Location: [facility] INVASIVE CV LAB;  Service: Cardiovascular;  Laterality: N/A;   AMPUTATION Right 01/22/2021   Procedure: 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;  Surgeon: Harden Jerona GAILS, MD;  Location: [facility] OR;  Service: Orthopedics;  Laterality: Right;   AMPUTATION Right 03/17/2021   Procedure: RIGHT BELOW KNEE AMPUTATION;  Surgeon: Harden Jerona GAILS, MD;  Location: [facility] OR;  Service: Orthopedics;  Laterality: Right;   ANKLE FUSION Right 01/22/2021   Procedure: RIGHT FOOT TIBIOCALCANEAL FUSION;  Surgeon: Harden Jerona GAILS, MD;  Location: [facility] OR;  Service: Orthopedics;  Laterality: Right;   ANTERIOR FUSION CERVICAL SPINE  2012   CARDIAC CATHETERIZATION  2011  CARDIAC CATHETERIZATION N/A 11/09/2016   Procedure: Right Heart Cath;  Surgeon: Victory LELON Sharps, MD;  Location: [facility] INVASIVE CV LAB;  Service: Cardiovascular;  Laterality: N/A;   carpel tunnel     left wrist   CATARACT EXTRACTION     CATARACT EXTRACTION W/ INTRAOCULAR LENS  IMPLANT, BILATERAL  2013   CERVICAL LAMINECTOMY  2012   COLONOSCOPY N/A 01/14/2013   Procedure: COLONOSCOPY;  Surgeon: Norleen LOISE Kiang, MD;  Location: [facility] ENDOSCOPY;  Service: Endoscopy;  Laterality: N/A;   CORONARY ARTERY BYPASS GRAFT  11/04/2020   LIMA-LAD, SVG-OM1, SVG-PDA (Dr Norleen Exon [facility]) dc 11/18/2020   EYE SURGERY     I & D EXTREMITY Right 02/19/2021   Procedure:  RIGHT ANKLE DEBRIDEMENT AND PLACEMENT ANTIBIOTIC BEADS;  Surgeon: Harden Jerona GAILS, MD;  Location: [facility] OR;  Service: Orthopedics;  Laterality: Right;   KNEE SURGERY  1998   left   LEFT HEART CATH AND CORONARY ANGIOGRAPHY N/A 07/10/2020   Procedure: LEFT HEART CATH AND CORONARY ANGIOGRAPHY;  Surgeon: Wonda Sharper, MD;  Location: [facility] INVASIVE CV LAB;  Service: Cardiovascular;  Laterality: N/A;   LUMBAR LAMINECTOMY  2003   LUNG BIOPSY Left 12/26/2016   Procedure: LUNG BIOPSY;  Surgeon: Elspeth JAYSON Millers, MD;  Location: [facility] OR;  Service: Thoracic;  Laterality: Left;   PACEMAKER IMPLANT N/A 03/30/2020   Procedure: PACEMAKER IMPLANT;  Surgeon: Waddell Danelle LELON, MD;  Location: [facility] INVASIVE CV LAB;  Service: Cardiovascular;  Laterality: N/A;   PERIPHERAL VASCULAR INTERVENTION Right 12/10/2020   Procedure: PERIPHERAL VASCULAR INTERVENTION;  Surgeon: Magda Debby LOISE, MD;  Location: [facility]VAS[facility] LAB;  Service: Cardiovascular;  Laterality: Right;  SFA   POSTERIOR FUSION CERVICAL SPINE  2012   PPM GENERATOR REMOVAL N/A 12/14/2020   Procedure: PPM GENERATOR REMOVAL;  Surgeon: Waddell Danelle LELON, MD;  Location: [facility] INVASIVE CV LAB;  Service: Cardiovascular;  Laterality: N/A;   TEE WITHOUT CARDIOVERSION N/A 12/11/2020   Procedure: TRANSESOPHAGEAL ECHOCARDIOGRAM (TEE);  Surgeon: Barbaraann Darryle Debby, MD;  Location: [facility] ENDOSCOPY;  Service: Cardiovascular;  Laterality: N/A;   TRIGGER FINGER RELEASE  2011   4th finger left hand   VIDEO ASSISTED THORACOSCOPY Left 12/26/2016   Procedure: VIDEO ASSISTED THORACOSCOPY;  Surgeon: Elspeth JAYSON Millers, MD;  Location: [facility] OR;  Service: Thoracic;  Laterality: Left;   VIDEO BRONCHOSCOPY N/A 12/26/2016   Procedure: VIDEO BRONCHOSCOPY;  Surgeon: Elspeth JAYSON Millers, MD;  Location: [facility] OR;  Service: Thoracic;  Laterality: N/A;    Family History  Problem Relation Age of Onset   Diabetes Mellitus II Mother    Emphysema Father 1   Heart attack Father    Colon cancer Neg Hx     Esophageal cancer Neg Hx    Rectal cancer Neg Hx    Stomach cancer Neg Hx     Social History:  reports that he has never smoked. He has never used smokeless tobacco. He reports that he does not drink alcohol and does not use drugs.  Allergies:  Allergies  Allergen Reactions   Codeine Hives and Itching   Ofev  [Nintedanib ] Diarrhea    SEVERE DIARRHEA   Pirfenidone  Diarrhea and Other (See Comments)    Esbriet  (Pirfenidone ) causes elevated LFTs. D/C on 06/14/17 and SEVERE DIARRHEA     Medications: Scheduled:  sodium chloride    Intravenous Once   atorvastatin   80 mg Oral Daily   Chlorhexidine  Gluconate Cloth  6 each Topical Daily   dexamethasone  (DECADRON ) injection  6 mg Intravenous Q24H  docusate sodium   100 mg Oral BID   escitalopram   10 mg Oral Daily   feeding supplement (NEPRO CARB STEADY)  237 mL Oral BID BM   gabapentin   300 mg Oral BID   HYDROmorphone    Intravenous Q4H   insulin  aspart  0-20 Units Subcutaneous TID WC   insulin  aspart  0-5 Units Subcutaneous QHS   insulin  glargine-yfgn  10 Units Subcutaneous Daily   multivitamin  1 tablet Oral QHS   pantoprazole   40 mg Oral Daily   sodium zirconium cyclosilicate   10 g Oral Daily   Continuous:  sodium chloride  10 mL/hr at 12/09/23 0829   acetaminophen  Stopped (12/09/23 0433)   prismasol  BGK 2/2.5 dialysis solution 1,500 mL/hr at 12/08/23 1247   prismasol  BGK 2/2.5 replacement solution 400 mL/hr at 12/08/23 1110   prismasol  BGK 2/2.5 replacement solution 400 mL/hr at 12/08/23 9087    Results for orders placed or performed during the hospital encounter of 12/06/23 (from the past 24 hours)  APTT     Status: Abnormal   Collection Time: 12/08/23  9:01 AM  Result Value Ref Range   aPTT 94 (H) 24 - 36 seconds  Glucose, capillary     Status: Abnormal   Collection Time: 12/08/23 11:57 AM  Result Value Ref Range   Glucose-Capillary 146 (H) 70 - 99 mg/dL  Renal function panel (daily at 1600)     Status: Abnormal    Collection Time: 12/08/23  4:09 PM  Result Value Ref Range   Sodium 135 135 - 145 mmol/L   Potassium 4.3 3.5 - 5.1 mmol/L   Chloride 99 98 - 111 mmol/L   CO2 26 22 - 32 mmol/L   Glucose, Bld 113 (H) 70 - 99 mg/dL   BUN 41 (H) 8 - 23 mg/dL   Creatinine, Ser 8.57 (H) 0.61 - 1.24 mg/dL   Calcium  8.5 (L) 8.9 - 10.3 mg/dL   Phosphorus 3.5 2.5 - 4.6 mg/dL   Albumin  2.8 (L) 3.5 - 5.0 g/dL   GFR, Estimated 49 (L) >60 mL/min   Anion gap 10 5 - 15  APTT     Status: Abnormal   Collection Time: 12/08/23  4:09 PM  Result Value Ref Range   aPTT 86 (H) 24 - 36 seconds  Glucose, capillary     Status: Abnormal   Collection Time: 12/08/23  4:35 PM  Result Value Ref Range   Glucose-Capillary 109 (H) 70 - 99 mg/dL  Glucose, capillary     Status: Abnormal   Collection Time: 12/08/23  9:13 PM  Result Value Ref Range   Glucose-Capillary 138 (H) 70 - 99 mg/dL  Renal function panel (daily at 0500)     Status: Abnormal   Collection Time: 12/09/23  3:48 AM  Result Value Ref Range   Sodium 133 (L) 135 - 145 mmol/L   Potassium 4.9 3.5 - 5.1 mmol/L   Chloride 99 98 - 111 mmol/L   CO2 25 22 - 32 mmol/L   Glucose, Bld 186 (H) 70 - 99 mg/dL   BUN 56 (H) 8 - 23 mg/dL   Creatinine, Ser 8.38 (H) 0.61 - 1.24 mg/dL   Calcium  8.2 (L) 8.9 - 10.3 mg/dL   Phosphorus 4.7 (H) 2.5 - 4.6 mg/dL   Albumin  2.6 (L) 3.5 - 5.0 g/dL   GFR, Estimated 42 (L) >60 mL/min   Anion gap 9 5 - 15  Magnesium      Status: None   Collection Time: 12/09/23  3:48 AM  Result Value Ref Range   Magnesium  2.3 1.7 - 2.4 mg/dL  Heparin  level (unfractionated)     Status: Abnormal   Collection Time: 12/09/23  3:48 AM  Result Value Ref Range   Heparin  Unfractionated >1.10 (H) 0.30 - 0.70 IU/mL  APTT     Status: Abnormal   Collection Time: 12/09/23  3:48 AM  Result Value Ref Range   aPTT 114 (H) 24 - 36 seconds  CBC     Status: Abnormal   Collection Time: 12/09/23  3:48 AM  Result Value Ref Range   WBC 15.1 (H) 4.0 - 10.5 K/uL   RBC  3.05 (L) 4.22 - 5.81 MIL/uL   Hemoglobin 8.3 (L) 13.0 - 17.0 g/dL   HCT 74.3 (L) 60.9 - 47.9 %   MCV 83.9 80.0 - 100.0 fL   MCH 27.2 26.0 - 34.0 pg   MCHC 32.4 30.0 - 36.0 g/dL   RDW 80.7 (H) 88.4 - 84.4 %   Platelets 197 150 - 400 K/uL   nRBC 0.1 0.0 - 0.2 %  Type and screen [facility]     Status: None   Collection Time: 12/09/23  3:55 AM  Result Value Ref Range   ABO/RH(D) O POS    Antibody Screen NEG    Sample Expiration      12/12/2023,2359 Performed at [facility], [address], [place], [place] [address]   Prepare platelet pheresis     Status: None (Preliminary result)   Collection Time: 12/09/23  4:08 AM  Result Value Ref Range   Unit Number [id number]    Blood Component Type PLTP1 PSORALEN TREATED    Unit division 00    Status of Unit ISSUED    Transfusion Status      OK TO TRANSFUSE Performed at [facility], [address], [place], [place] [address]   Glucose, capillary     Status: Abnormal   Collection Time: 12/09/23  8:03 AM  Result Value Ref Range   Glucose-Capillary 204 (H) 70 - 99 mg/dL   *Note: Due to a large number of results and/or encounters for the requested time period, some results have not been displayed. A complete set of results can be found in Results Review.     CT ABDOMEN PELVIS W CONTRAST Result Date: 12/09/2023 CLINICAL DATA:  Right lower quadrant abdominal pain EXAM: CT ABDOMEN AND PELVIS WITH CONTRAST TECHNIQUE: Multidetector CT imaging of the abdomen and pelvis was performed using the standard protocol following bolus administration of intravenous contrast. RADIATION DOSE REDUCTION: This exam was performed according to the departmental dose-optimization program which includes automated exposure control, adjustment of the mA and/or kV according to patient size and/or use of iterative reconstruction technique. CONTRAST:  75mL OMNIPAQUE  IOHEXOL  350 MG/ML SOLN COMPARISON:  CT abdomen and pelvis 04/27/2018 FINDINGS:  Lower chest: Peribronchovascular and peripheral ground-glass and reticular opacities compatible with interstitial lung disease. Small bilateral pleural effusions. Coronary artery atherosclerosis.  TAVR. Hepatobiliary: No acute abnormality. Pancreas: Unremarkable. Spleen: Unremarkable. Adrenals/Urinary Tract: Normal adrenal glands. No urinary calculi or hydronephrosis. Foley catheter and gas within the decompressed bladder. Stomach/Bowel: Normal caliber large and small bowel. No bowel wall thickening. Colonic diverticulosis without diverticulitis. Stomach and appendix are within normal limits. Vascular/Lymphatic: Right retroperitoneal hematoma measures 7.4 x 6.2 x 10.2 cm. Foci of hyperintensity on initial phase imaging (series 3/image 58-60) with diffusion on delayed phase images compatible with active hemorrhage. Blood tracks inferiorly along the right psoas. Reproductive: No acute  abnormality. Other: No free intraperitoneal air. Musculoskeletal: No acute fracture. Chronic bilateral L5 pars defects. IMPRESSION: 1. Right retroperitoneal hematoma with focus of active hemorrhage. 2. Chronic interstitial lung disease. Small bilateral pleural effusions. Superimposed edema or infection is difficult to exclude. These results were called by telephone at the time of interpretation on 12/09/2023 at 3:53 am to provider ADITYA PALIWAL , who verbally acknowledged these results. Electronically Signed   By: Norman Gatlin M.D.   On: 12/09/2023 03:54    ROS:  As stated above in the HPI otherwise negative.  Blood pressure (!) 108/52, pulse 70, temperature 97.8 F (36.6 C), temperature source Oral, resp. rate 11, height 6' (1.829 m), weight 108.6 kg, SpO2 100%.    PE: Gen: NAD, Alert and Oriented HEENT:  [place]/AT, EOMI Neck: Supple, no LAD Lungs: CTA Bilaterally CV: RRR without M/G/R ABD: Soft, NTND, +BS Ext: No C/C/E  Assessment/Plan: 1) Dysphagia. 2) Acute right retroperitoneal bleed. 3) COVID. 4) CHF.   With  the acute right retroperitoneal bleed, an EGD with dilation will not be pursued at this time.  This is a chronic issue and there is no immediacy with evaluating and treating this issue.  It can potentially be worked up later during his hospitalization versus outpatient work up.  Plan: 1) Continue supportive care for his multiple medical issues. 2) His case will be signed out to [facility] for further management decisions on Monday.  Karne Ozga D 12/09/2023, 8:27 AM

## 2023-12-09 NOTE — Progress Notes (Signed)
 NAME:  Shane [name], MRN:  [id number], DOB:  10-07-1941, LOS: 3 ADMISSION DATE:  12/06/2023, CONSULTATION DATE:  12/06/2023 REFERRING MD:  Ula, EDP, CHIEF COMPLAINT:  aki, hypotension  History of Present Illness:  83 year old male with significant past medical history including obstructive sleep apnea on CPAP at night, CAD s/p CABG x3 in 2021, aortic stenosis s/p TAVR 08/2023 at [facility], HFpEF, atrial fibrillation on Eliquis , plavix , sick sinus syndrome s/p leadless PPM, hypertension, hyperlipidemia, GERD, uncontrolled diabetes, CKD 3, pulmonary fibrosis followed by Dr. Geronimo, who presented to the emergency department on 12/06/2023 with shortness of breath.  Per the ED provider complaining of cough and shortness of breath over the past 3 days.  Using his nebulizers at home with no improvement in his symptoms.  EMS was called and found to be hypotensive and hypoxic.  He was started on nonrebreather and an epinephrine  infusion prior to arrival.  In the ED afebrile, SBP around 100, tachypneic.  Labs +COVID, AKI on CKD with BUN/Cr 116, 4.69 with critical K of 7.1. bicarb 16. CBC without leukocytosis, hgb 9.5. lactic 2.3>1.9, troponin 8,290, UA negative, blood cultures pending. CXR with likely pulmonary edema. With new AKI, hyperkalemia and EKG changes, nephrology was consulted. Patient given lokelma , albuterol , Lasix , sodium bicarb, 5U insulin  with D50, calcium  gluconate. Was also given azithromycin /rocephin  CAP coverage. Neprhology recommended albumin  for BP support (initially on 2mcg levo, now off), Lasix  80mg  IV, renal ultrasound. Pending repeat BMP on consult for admission to ICU.  On my exam, laying in bed, mildly tachypneic on albuterol  treatment. Alert and oriented. Endorses the above story. He does state he had diarrhea over the preceding 2 days and intermittent nausea without vomiting. Decreased appetite. He does feel volume overloaded from his baseline. States his abdomen feels tighter. Wife is at  bedside, endorses story.   Pertinent  Medical History  obstructive sleep apnea on CPAP at night, CAD s/p CABG x3 in 2021, aortic stenosis s/p TAVR 08/2023 at [facility], HFpEF, atrial fibrillation on Eliquis , plavix , sick sinus syndrome s/p leadless PPM, hypertension, hyperlipidemia, GERD, uncontrolled diabetes, CKD 3, pulmonary fibrosis  Significant Hospital Events: Including procedures, antibiotic start and stop dates in addition to other pertinent events   1/1: Admit to CCM for covid pneumonia, aki with hyperK likely needing CRRT  Renal consultation -presented with a creatinine of 4.  7 mg percent.  [Baseline July 2024 was 1.8 mg percent] 1/2: CRRT initiated, Card Consult due to elev Troponin (Nstemi vs Demand Ischemia)  Cardiology consultation -ejection fraction 40% compared to baseline 55%. 1/3: Continuing CRRT till this afternoon, PCCM sign off this afternoon, trx to [facility] tomorrow  -> heparin  being continuedFor total 48 hours.  Cardiology recommended continuing Plavix  but holding aspirin .  Cardiology recommending Toprol  once blood pressure stabilized.  Interim History / Subjective:    12/09/23: Was transferred to the floor but back in the ICU after developing sudden onset abdominal pain that was severe.  Large etroperitoneal bleed was discovered 7-10/6 cm with active extravasation..  Origin appears to be the right psoas muscle.  IV heparin  was stopped.  He was on DAPT therapy platelets and protamine  were ordered.  Surgery is recommended protamine  and platelets.  And stopping heparin  Plavix  and aspirin .  Interventional radiology consulted.  Patient expressed full code.  Interventional radiology is hopeful for spontaneous remission.  - Per RN Ur OOp dropping  Objective   Blood pressure (!) 112/55, pulse 81, temperature 97.8 F (36.6 C), temperature source Oral, resp. rate 10, height  6' (1.829 m), weight 108.6 kg, SpO2 98%.        Intake/Output Summary (Last 24 hours) at 12/09/2023 0802 Last data  filed at 12/09/2023 0500 Gross per 24 hour  Intake 615.97 ml  Output 1335.7 ml  Net -719.73 ml   Filed Weights   12/07/23 0000 12/07/23 0344 12/08/23 0500  Weight: 109.4 kg 109.4 kg 108.6 kg     General Appearance:  OBese. Mildly pale. REsting comfortably but mildly tachypenic Head:  Normocephalic, without obvious abnormality, atraumatic Eyes:  PERRL - yes, conjunctiva/corneas - muddy     Ears:  Normal external ear canals, both ears Nose:  G tube - no but has [place] Throat:  ETT TUBE - no Neck:  Supple,  No enlargement/tenderness/nodules Lungs: Clear to auscultation bilaterally, But basal crackles Heart:  S1 and S2 normal, no murmur, CVP - no.  Pressors - no Abdomen:  Soft, no masses, no organomegaly Genitalia / Rectal:  Not done Extremities:  Extremities-  RT BKA, LEft foot partial amputation - has ulcer Skin:  ntact in exposed areas . Sacral area - no decub Neurologic:  Sedation - none -> RASS - 0 . Moves all 4s - yes. CAM-ICU - neg . Orientation - x3+   Last Weight  Most recent update: 12/08/2023  6:31 AM    Weight  108.6 kg (239 lb 6.7 oz)               Resolved Hospital Problem list    Assessment & Plan:  New RT Psoas RP bleed 12/08/23  12/09/23: Currently BP/HR holding but soft./ s/p 1 Unit plateet and protamine . No PRBC given so far. Seen by CCS - no intervention. Seen by IR: close followup. Anticipate self-seal  Plan  - DDAVP   x 1 dose (d/w Dr Madonna Large and Dr Almarie Bonine - counseled on risks v benefits to patient/wife) - - PRBC for hgb </= 6.9gm%    - exceptions are   -  if ACS susepcted/confirmed then transfuse for hgb </= 8.0gm%,  or    -  active bleeding with hemodynamic instability, then transfuse regardless of hemoglobin value   At at all times try to transfuse 1 unit prbc as possible with exception of active hemorrhage    Pulmonary fibrosis - sees Stephfon Bovey   -on night o2 and supportive care Obstructive sleep apnea on CPAP (2 L baseline)  Acute  hypoxic respiratory failure in setting of Covid 19  abd HFpEF - - Likely multifactorial in the setting of acute COVID-19 vs CHF exacerbation vs  and Covid. 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  12/09/23: On 2L [place] and stable   Plan  - o2 for pulse ox > 92%  - decadron  for COVID - Remdesivir   1/2 - 1/3 - cefrtiaxoe 1/1 - 1/1 - axithro 1/1- 1/1   Acute kidney injury on chronic kidney disease; baseline creatinine 1.7-1.9 - s/p CRRT ending 12/08/23 Hyperkalemia with EKG changes-resolving  Non anion gap metabolic acidosis-resolving   07/09/73 - came off CRRT yesterday but now with RP bleed - Ur OP tailing off  P: Await renal consult  Acute on chronic heart failure exacerbation;  Elevated Troponin, Concern for NSTEMI most recent echo 06/2023 EF 50-55%, LVH, G3DD, RV mod reduced, LA severely dilated, RA mod dilated troponin in the ED 8,290>> 7,625 BNP 480.6 EKG with paced rhythm.  Known significant cardiac disease.  He does report having episodes of chest pain over the past few days with his shortness of breath.  ECHO LVEF 40 %, LV function moderate decrease in function, LV demonstrates regional wall abnormalities, severe hypokinesis, Mild to moderate Mitral valve regurg. RV systolic function reduced   1/4/25_ BP/HR stable but had RP Bleed  P: No ASA No  Plavix  No heparin  No DOAC  Await cariology rounds - d/w DR Michele  CAD HFpEF Atrial fibrillation on Eliquis  Sick sinus syndrome s/p leadless PPM Aortic stenosis s/p TAVR P: Cards following, appreciate assistance and recs  Will need ischemic cardiac workup once clinically appropriate   Hypertension  Hyperlipidemia  1/4/@5 : Ciurrently BP soft  P: Continue to hold antihypertensives for now  DC levophed  gtt off MAR Start statin     Type 2 diabetes poorly controlled most recent A1c 9  P: Continue SSI Continue Basal insulin   Heart healthy diet with Carb modifications   Suspected Esophageal Dysphagia  Per speech eval patient  having issues with swallowing, regurgitation during and prior to admission, as well as aversion to certain solid food Speech therapy recommending GI consult for possible esophogram/MBS  P:  Will consult GI for further eval Continue Regular thin liquid diet per SPT recs    Best Practice (right click and Reselect all SmartList Selections daily)   Diet/type: Reg, thin DVT prophylaxis:  heparin  gtt  Pressure ulcer(s): Left foot wound, POA GI prophylaxis: N/A Lines: rt internal jugular HD CATH Foley:  Yes, and it is still needed Code Status:  full code  Last date of multidisciplinary goals of care discussion   12/09/23: Patient and wife updated at beside      ATTESTATION & SIGNATURE   The patient Shane [name] is critically ill with multiple organ systems failure and requires high complexity decision making for assessment and support, frequent evaluation and titration of therapies, application of advanced monitoring technologies and extensive interpretation of multiple databases and discussion with other appropriate health care personnel such as bedside nurses, social workers, case production designer, theatre/television/film, consultants, respiratory therapists, nutritionists, secretaries etc.,  Critical care time includes but is not restricted to just documentation time. Documentation can happen in parallel or sequential to care time depending on case mix urgency and priorities for the shift. So, overall critical Care Time devoted to patient care services described in this note is  50  Minutes.   This time reflects time of care of this signee Dr Dorethia Cave which includ does not reflect procedure time, or teaching time or supervisory time of PA/NP/Med student/Med Resident etc but could involve care discussion time     Dr. Dorethia Cave, M.D., [facility].C.P Pulmonary and Critical Care Medicine Staff Physician, [facility] [place] [facility] Pager: [phone], If no answer or between  15:00h  - 7:00h: call [phone]  12/09/2023 8:12 AM   LABS    PULMONARY No results for input(s): PHART, PCO2ART, PO2ART, HCO3, TCO2, O2SAT in the last 168 hours.  Invalid input(s): PCO2, PO2  CBC Recent Labs  Lab 12/07/23 2241 12/08/23 0355 12/09/23 0348  HGB 9.2* 9.1* 8.3*  HCT 27.7* 27.3* 25.6*  WBC 13.2* 13.2* 15.1*  PLT 168 178 197    COAGULATION Recent Labs  Lab 12/06/23 1840  INR 1.6*    CARDIAC  No results for input(s): TROPONINI in the last 168 hours. No results for input(s): PROBNP in the last 168 hours.   CHEMISTRY Recent Labs  Lab 12/06/23 2310 12/07/23 0116 12/07/23 0405 12/07/23 0500 12/07/23 0949 12/07/23 1554 12/08/23 0355 12/08/23 1609 12/09/23 0348  NA  --  128*  --  130*  --  132* 134* 135 133*  K 6.2* 6.3*  --  6.7*   < > 4.9 5.2* 4.3 4.9  CL  --  94*  --  96*  --  94* 97* 99 99  CO2  --  15*  --  17*  --  22 24 26 25   GLUCOSE  --  282*  --  321*  --  163* 170* 113* 186*  BUN  --  122*  --  123*  --  75* 53* 41* 56*  CREATININE 5.16* 5.29*  --  5.23*  --  2.72* 1.66* 1.42* 1.61*  CALCIUM   --  8.2*  --  8.2*  --  8.2* 8.5* 8.5* 8.2*  MG 1.8 1.8 1.9  --   --   --  2.2  --  2.3  PHOS 7.6* 7.3*  --  6.8*  --  4.2 3.8 3.5 4.7*   < > = values in this interval not displayed.   Estimated Creatinine Clearance: 45 mL/min (A) (by C-G formula based on SCr of 1.61 mg/dL (H)).   LIVER Recent Labs  Lab 12/06/23 1840 12/07/23 0500 12/07/23 1554 12/08/23 0355 12/08/23 1609 12/09/23 0348  AST 45*  --   --   --   --   --   ALT 16  --   --   --   --   --   ALKPHOS 85  --   --   --   --   --   BILITOT 0.6  --   --   --   --   --   PROT 6.8  --   --   --   --   --   ALBUMIN  2.9* 2.9* 2.7* 2.7* 2.8* 2.6*  INR 1.6*  --   --   --   --   --      INFECTIOUS Recent Labs  Lab 12/06/23 1852 12/06/23 2026 12/06/23 2310  LATICACIDVEN 2.3* 1.9  --   PROCALCITON  --   --  0.22     ENDOCRINE CBG (last 3)  Recent Labs     12/08/23 1635 12/08/23 2113 12/09/23 0803  GLUCAP 109* 138* 204*         IMAGING x48h  - image(s) personally visualized  -   highlighted in bold CT ABDOMEN PELVIS W CONTRAST Result Date: 12/09/2023 CLINICAL DATA:  Right lower quadrant abdominal pain EXAM: CT ABDOMEN AND PELVIS WITH CONTRAST TECHNIQUE: Multidetector CT imaging of the abdomen and pelvis was performed using the standard protocol following bolus administration of intravenous contrast. RADIATION DOSE REDUCTION: This exam was performed according to the departmental dose-optimization program which includes automated exposure control, adjustment of the mA and/or kV according to patient size and/or use of iterative reconstruction technique. CONTRAST:  75mL OMNIPAQUE  IOHEXOL  350 MG/ML SOLN COMPARISON:  CT abdomen and pelvis 04/27/2018 FINDINGS: Lower chest: Peribronchovascular and peripheral ground-glass and reticular opacities compatible with interstitial lung disease. Small bilateral pleural effusions. Coronary artery atherosclerosis.  TAVR. Hepatobiliary: No acute abnormality. Pancreas: Unremarkable. Spleen: Unremarkable. Adrenals/Urinary Tract: Normal adrenal glands. No urinary calculi or hydronephrosis. Foley catheter and gas within the decompressed bladder. Stomach/Bowel: Normal caliber large and small bowel. No bowel wall thickening. Colonic diverticulosis without diverticulitis. Stomach and appendix are within normal limits. Vascular/Lymphatic: Right retroperitoneal hematoma measures 7.4 x 6.2 x 10.2 cm. Foci of hyperintensity on initial phase imaging (series 3/image [id number]) with diffusion on delayed phase images compatible with active  hemorrhage. Blood tracks inferiorly along the right psoas. Reproductive: No acute abnormality. Other: No free intraperitoneal air. Musculoskeletal: No acute fracture. Chronic bilateral L5 pars defects. IMPRESSION: 1. Right retroperitoneal hematoma with focus of active hemorrhage. 2. Chronic interstitial lung  disease. Small bilateral pleural effusions. Superimposed edema or infection is difficult to exclude. These results were called by telephone at the time of interpretation on 12/09/2023 at 3:53 am to provider ADITYA PALIWAL , who verbally acknowledged these results. Electronically Signed   By: Norman Gatlin M.D.   On: 12/09/2023 03:54

## 2023-12-09 NOTE — Consult Note (Signed)
 Chief Complaint: Anticoagulation related Retroperitoneal hemorrhage  Referring Physician(s): Dr [name]  PCP: Dr. [name] [facility]: [facility]: [facility]: Dr Shane  Supervising Physician: [name]  Patient Status: Shane [name] - In-pt, 58M-05   History of Present Illness: Shane [name] is a 83 y.o. male admitted to [name] via the ED for primary complaint 12/06/23 of SOB.  Comorbid conditions include PVD, CAD, DM, ILD, atrial fibrillation (home eliquis ), AS (post TAVR, [facility]), right BKA, left TMA, sick sinus syndrome, OSA, hx heart failure, CKD.    Admission diagnosis of COVID-19 and NSTEMI, with peak Trop elevated >8K, and BNP 480.   He was started on heparin , and plavix  continued.  Question of triple therapy that would include ASA.   The patient tells me that he has some back/abdominal pain all the time, though is sure that this pain worsened in the past 24 hours.  He localizes this to his right lower abdomen.  He is very uncomfortable.   CT was performed completred 12/09/23 3:13am.  This shows right RP hemorrhage, originating within the right psoas muscle.    Surgery was consulted.  I discussed with Dr. Bureau.  He has received protamine  reversal for the hep ggt.   He is receiving 1 U of platelets.  Has not received any PRBC's yet.  Not requiring pressors at this time.  Was on renal replacement therapy, nephrology on board  Vital signs: In room: HR 80-95, SBP 100-110 HR: 72 - 95 BP: 90-115 / 48-82 O2: 98-100  Labs: WBC: 15.1 H/H: 8.3/25.6 <-- 9.1/27.3 <-- 9.2/27.7 <-- 9.1/28.6 (5 mo prior = 11.7/37.7) Platelets: 197 Na: 133 K: 4.9 BUN: 56 Cr: 1.61 <-- 1.42 <-- 1.66 <-- 2.72 <-- 5.23 <-- 5.29 (admission) Current Gfr ~42.  On admission less than 10 Hgb A1C: 8.5 Albumin  1/1: 2.9 AST 1/1: 45 Tbili 1/1: 0.6      Past Medical History:  Diagnosis Date   Acute blood loss anemia    Anxiety    AV block, Mobitz 1    Cataract     Chronic kidney disease    d/t DM   CKD (chronic kidney disease), stage III (HCC)    COVID-19 virus infection 07/18/2020   Last Assessment & Plan:   Formatting of this note might be different from the original.  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 10/2019 Echo: EF 55-60%, Gr2 DD. Nl RV.    Neuromuscular disorder (HCC)    Neuropathy associated with endocrine disorder (HCC)    Nonobstructive CAD (coronary artery disease)    a. 2012 Cath: mod, nonobs dzs; b. 10/2016 MV: EF 60%, no ischemia.   OSA on CPAP 05/05/2017   Unattended Home Sleep Test 7/2/813-AHI 38.6/hour, desaturation to 64%, body weight 261 pounds   PONV (postoperative nausea and vomiting)    Postoperative anemia due to acute blood loss 11/07/2020   Postoperative hemorrhagic shock 03/20/2021   Sleep apnea  uses cpap asked to bring mask and tubing    Past Surgical History:  Procedure Laterality Date   ABDOMINAL AORTOGRAM W/LOWER EXTREMITY N/A 12/10/2020   Procedure: ABDOMINAL AORTOGRAM W/LOWER EXTREMITY;  Surgeon: Shane Debby SAILOR, MD;  Location: [facility] INVASIVE CV LAB;  Service: Cardiovascular;  Laterality: N/A;   AMPUTATION Right 01/22/2021   Procedure: 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;  Surgeon: Shane Jerona GAILS, MD;  Location: [name] OR;  Service: Orthopedics;  Laterality: Right;   AMPUTATION Right 03/17/2021   Procedure: RIGHT BELOW KNEE AMPUTATION;  Surgeon: Shane Jerona GAILS, MD;  Location: [facility] OR;  Service: Orthopedics;  Laterality: Right;   ANKLE FUSION Right 01/22/2021    Procedure: RIGHT FOOT TIBIOCALCANEAL FUSION;  Surgeon: Shane Jerona GAILS, MD;  Location: [facility] OR;  Service: Orthopedics;  Laterality: Right;   ANTERIOR FUSION CERVICAL SPINE  2012   CARDIAC CATHETERIZATION  2011   CARDIAC CATHETERIZATION N/A 11/09/2016   Procedure: Right Heart Cath;  Surgeon: Victory [name] Sharps, MD;  Location: [name]VAS[facility] CV LAB;  Service: Cardiovascular;  Laterality: N/A;   carpel tunnel     left wrist   CATARACT EXTRACTION     CATARACT EXTRACTION W/ INTRAOCULAR LENS  IMPLANT, BILATERAL  2013   CERVICAL LAMINECTOMY  2012   COLONOSCOPY N/A 01/14/2013   Procedure: COLONOSCOPY;  Surgeon: Shane [name] Kiang, MD;  Location: [facility] ENDOSCOPY;  Service: Endoscopy;  Laterality: N/A;   CORONARY ARTERY BYPASS GRAFT  11/04/2020   LIMA-LAD, SVG-OM1, SVG-PDA (Dr [facility]) dc 11/18/2020   EYE SURGERY     I & D EXTREMITY Right 02/19/2021   Procedure: RIGHT ANKLE DEBRIDEMENT AND PLACEMENT ANTIBIOTIC BEADS;  Surgeon: Shane Jerona GAILS, MD;  Location: [facility] OR;  Service: Orthopedics;  Laterality: Right;   KNEE SURGERY  1998   left   LEFT HEART CATH AND CORONARY ANGIOGRAPHY N/A 07/10/2020   Procedure: LEFT HEART CATH AND CORONARY ANGIOGRAPHY;  Surgeon: Shane Sharper, MD;  Location: [name] INVASIVE CV LAB;  Service: Cardiovascular;  Laterality: N/A;   LUMBAR LAMINECTOMY  2003   LUNG BIOPSY Left 12/26/2016   Procedure: LUNG BIOPSY;  Surgeon: Shane JAYSON Millers, MD;  Location: [name] OR;  Service: Thoracic;  Laterality: Left;   PACEMAKER IMPLANT N/A 03/30/2020   Procedure: PACEMAKER IMPLANT;  Surgeon: Shane Danelle LELON, MD;  Location: [facility]VASIVE CV LAB;  Service: Cardiovascular;  Laterality: N/A;   PERIPHERAL VASCULAR INTERVENTION Right 12/10/2020   Procedure: PERIPHERAL VASCULAR INTERVENTION;  Surgeon: Shane Debby SAILOR, MD;  Location: [facility];  Service: Cardiovascular;  Laterality: Right;  SFA   POSTERIOR FUSION CERVICAL SPINE  2012   PPM GENERATOR REMOVAL N/A 12/14/2020   Procedure: PPM GENERATOR REMOVAL;   Surgeon: Shane Danelle LELON, MD;  Location: [facility]VASIVE [facility] LAB;  Service: Cardiovascular;  Laterality: N/A;   TEE WITHOUT CARDIOVERSION N/A 12/11/2020   Procedure: TRANSESOPHAGEAL ECHOCARDIOGRAM (TEE);  Surgeon: Barbaraann Darryle Debby, MD;  Location: [facility] ENDOSCOPY;  Service: Cardiovascular;  Laterality: N/A;   TRIGGER FINGER RELEASE  2011   4th finger left hand   VIDEO ASSISTED THORACOSCOPY Left 12/26/2016   Procedure: VIDEO ASSISTED THORACOSCOPY;  Surgeon: Shane JAYSON Millers, MD;  Location: [facility] OR;  Service: Thoracic;  Laterality: Left;   VIDEO BRONCHOSCOPY N/A 12/26/2016   Procedure: VIDEO BRONCHOSCOPY;  Surgeon: Shane JAYSON Millers, MD;  Location: [name];  Service: Thoracic;  Laterality: N/A;    Allergies: Codeine, Ofev  [nintedanib ], and Pirfenidone   Medications: Prior to Admission medications   Medication Sig Start Date End Date  Taking? Authorizing Provider  albuterol  (PROVENTIL ) (2.5 MG/3ML) 0.083% nebulizer solution Take 2.5 mg by nebulization 2 (two) times daily as needed for wheezing or shortness of breath.   Yes [provider]  albuterol  (VENTOLIN  HFA) 108 (90 Base) MCG/ACT inhaler Inhale 2 puffs into the lungs every 4 (four) hours as needed for wheezing or shortness of breath. Patient taking differently: Inhale 3 puffs into the lungs every 4 (four) hours as needed for wheezing or shortness of breath. 04/12/21  Yes Angiulli, Toribio PARAS, PA-C  apixaban  (ELIQUIS ) 5 MG TABS tablet Take 1 tablet (5 mg total) by mouth 2 (two) times daily. Patient taking differently: Take 2.5 mg by mouth 2 (two) times daily. 04/12/21  Yes Angiulli, Toribio PARAS, PA-C  Ascorbic Acid  (VITAMIN C  WITH ROSE HIPS) 500 MG tablet Take 1 tablet (500 mg total) by mouth daily. 04/12/21  Yes Angiulli, Toribio PARAS, PA-C  atorvastatin  (LIPITOR ) 80 MG tablet TAKE 1 TABLET BY MOUTH EVERY DAY Patient taking differently: Take 80 mg by mouth at bedtime. 07/25/22  Yes Pietro Redell RAMAN, MD  clopidogrel  (PLAVIX ) 75 MG tablet Take 75 mg by  mouth daily. 09/04/22  Yes [provider]  diphenhydrAMINE  HCl, Sleep, (ZZZQUIL PO) Take 15 mLs by mouth at bedtime as needed (cough).   Yes [provider]  docusate sodium  (COLACE) 100 MG capsule Take 1 capsule (100 mg total) by mouth daily. Patient taking differently: Take 100 mg by mouth as needed for mild constipation or moderate constipation. 04/13/21  Yes Angiulli, Toribio PARAS, PA-C  escitalopram  (LEXAPRO ) 10 MG tablet Take 1 tablet (10 mg total) by mouth daily. 04/12/21  Yes Angiulli, Toribio PARAS, PA-C  FARXIGA  10 MG TABS tablet Take 10 mg by mouth daily.   Yes [provider]  furosemide  (LASIX ) 20 MG tablet Take 3 tablets (60 mg total) by mouth daily. Patient taking differently: Take 20-40 mg by mouth See admin instructions. Take 2 tablets by mouth in the morning and 1 tablet at night 06/24/23 12/05/24 Yes Ezenduka, Nkeiruka J, MD  HUMALOG  KWIKPEN 100 UNIT/ML KwikPen Inject 10-15 Units into the skin 3 (three) times daily. Depending what his blood sugar reading is   Yes [provider]  HYDROMET 5-1.5 MG/5ML syrup Take 5 mLs by mouth as needed for cough. 05/29/23  Yes [provider]  lisinopril  (ZESTRIL ) 10 MG tablet Take 10 mg by mouth daily. 08/22/23  Yes [provider]  LORazepam  (ATIVAN ) 1 MG tablet Take 1 tablet (1 mg total) by mouth at bedtime. Patient taking differently: Take 1 mg by mouth at bedtime as needed (sleep). 04/12/21  Yes Angiulli, Toribio PARAS, PA-C  metoprolol  tartrate (LOPRESSOR ) 25 MG tablet Take 25 mg by mouth daily.   Yes [provider]  Multiple Vitamin (MULTIVITAMIN WITH MINERALS) TABS tablet Take 1 tablet by mouth daily.   Yes [provider]  mupirocin  ointment (BACTROBAN ) 2 % Apply 1 Application topically at bedtime. 06/06/23  Yes [provider]  nitroGLYCERIN  (NITRODUR - DOSED IN MG/24 HR) 0.2 mg/hr patch Place 1 patch (0.2 mg total) onto the skin daily. 11/23/23  Yes Shane Jerona GAILS, MD  Olopatadine  HCl (PATADAY OP) Place 2 drops into both eyes daily as needed (allergies).   Yes [provider]  ondansetron  (ZOFRAN ) 4 MG tablet Take 4 mg by mouth as needed for nausea or vomiting. 06/15/23  Yes [provider]  pantoprazole  (PROTONIX ) 40 MG tablet Take 1 tablet (40 mg total) by mouth 2 (two) times  daily. Patient taking differently: Take 40 mg by mouth daily. 04/12/21  Yes Angiulli, Daniel J, PA-C  polyethylene glycol powder (GLYCOLAX /MIRALAX ) 17 GM/SCOOP powder Take 17 g by mouth every other day. 11/15/20  Yes [provider]  traZODone  (DESYREL ) 50 MG tablet Take 75 mg by mouth at bedtime as needed for sleep. 05/11/23  Yes [provider]  TRESIBA  FLEXTOUCH 100 UNIT/ML FlexTouch Pen Inject 20 Units into the skin daily. 12/22/22  Yes [provider]  zinc  sulfate 220 (50 Zn) MG capsule Take 1 capsule (220 mg total) by mouth daily. 04/12/21  Yes Angiulli, Toribio PARAS, PA-C  gabapentin  (NEURONTIN ) 300 MG capsule TAKE 1 CAPSULE BY MOUTH THREE TIMES A DAY Patient taking differently: Take 300 mg by mouth 2 (two) times daily. 12/14/21   Valdemar Rocky SAUNDERS, NP     Family History  Problem Relation Age of Onset   Diabetes Mellitus II Mother    Emphysema Father 30   Heart attack Father    Colon cancer Neg Hx    Esophageal cancer Neg Hx    Rectal cancer Neg Hx    Stomach cancer Neg Hx     Social History   Socioeconomic History   Marital status: Married    Spouse name: Not on file   Number of children: Not on file   Years of education: Not on file   Highest education level: Not on file  Occupational History   Not on file  Tobacco Use   Smoking status: Never   Smokeless tobacco: Never  Vaping Use   Vaping status: Never Used  Substance and Sexual Activity   Alcohol use: No   Drug use: No   Sexual activity: Not Currently  Other Topics Concern   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    Social Drivers of Manufacturing Engineer Strain: Low Risk  (08/22/2023)   Received from [name]   Overall Financial Resource Strain (CARDIA)    Difficulty of Paying Living Expenses: Not hard at all  Food Insecurity: No Food Insecurity (12/07/2023)   Hunger Vital Sign    Worried About Running Out of Food in the Last Year: Never true    Ran Out of Food in the Last Year: Never true  Transportation Needs: No Transportation Needs (12/07/2023)   PRAPARE - Administrator, Civil Service (Medical): No    Lack of Transportation (Non-Medical): No  Physical Activity: Not on file  Stress: Not on file  Social Connections: Moderately Isolated (12/07/2023)   Social Connection and Isolation Panel [NHANES]    Frequency of Communication with Friends and Family: More than three times a week    Frequency of Social Gatherings with Friends and Family: More than three times a week    Attends Religious Services: Never    Database Administrator or Organizations: No    Attends Banker Meetings: Never    Marital Status: Married      Review of Systems: A 12 point ROS discussed and pertinent positives are indicated in the HPI above.  All other systems are negative.  Review of Systems  Vital Signs: BP (!) 100/52   Pulse 95   Temp 97.8 F (36.6 C) (Oral)   Resp (!) 21   Ht 6' (1.829 m)   Wt 108.6 kg   SpO2 97%   BMI 32.47 kg/m   Advance Care Plan: The advanced care  plan/surrogate decision maker was discussed at the time of visit and documented in the medical record.    Physical Exam  Imaging: CT ABDOMEN PELVIS W CONTRAST Result Date: 12/09/2023 CLINICAL DATA:  Right lower quadrant abdominal pain EXAM: CT ABDOMEN AND PELVIS WITH CONTRAST TECHNIQUE: Multidetector CT imaging of the abdomen and pelvis was performed using the standard protocol following bolus administration of intravenous contrast. RADIATION DOSE REDUCTION: This exam was performed according to the departmental  dose-optimization program which includes automated exposure control, adjustment of the mA and/or kV according to patient size and/or use of iterative reconstruction technique. CONTRAST:  75mL OMNIPAQUE  IOHEXOL  350 MG/ML SOLN COMPARISON:  CT abdomen and pelvis 04/27/2018 FINDINGS: Lower chest: Peribronchovascular and peripheral ground-glass and reticular opacities compatible with interstitial lung disease. Small bilateral pleural effusions. Coronary artery atherosclerosis.  TAVR. Hepatobiliary: No acute abnormality. Pancreas: Unremarkable. Spleen: Unremarkable. Adrenals/Urinary Tract: Normal adrenal glands. No urinary calculi or hydronephrosis. Foley catheter and gas within the decompressed bladder. Stomach/Bowel: Normal caliber large and small bowel. No bowel wall thickening. Colonic diverticulosis without diverticulitis. Stomach and appendix are within normal limits. Vascular/Lymphatic: Right retroperitoneal hematoma measures 7.4 x 6.2 x 10.2 cm. Foci of hyperintensity on initial phase imaging (series 3/image 58-60) with diffusion on delayed phase images compatible with active hemorrhage. Blood tracks inferiorly along the right psoas. Reproductive: No acute abnormality. Other: No free intraperitoneal air. Musculoskeletal: No acute fracture. Chronic bilateral L5 pars defects. IMPRESSION: 1. Right retroperitoneal hematoma with focus of active hemorrhage. 2. Chronic interstitial lung disease. Small bilateral pleural effusions. Superimposed edema or infection is difficult to exclude. These results were called by telephone at the time of interpretation on 12/09/2023 at 3:53 am to provider ADITYA PALIWAL , who verbally acknowledged these results. Electronically Signed   By: Norman Gatlin M.D.   On: 12/09/2023 03:54   ECHOCARDIOGRAM COMPLETE Result Date: 12/07/2023    ECHOCARDIOGRAM REPORT   Patient Name:   ALCIDE MEMOLI Date of Exam: 12/07/2023 Medical Rec #:  [id number]        Height:       72.0 in Accession #:     [id number]       Weight:       241.2 lb Date of Birth:  02/09/1941        BSA:          2.307 m Patient Age:    82 years         BP:           120/54 mmHg Patient Gender: M                HR:           75 bpm. Exam Location:  Inpatient Procedure: 2D Echo, Color Doppler, Cardiac Doppler and Intracardiac            Opacification Agent STAT ECHO Indications:    NSTEMI  History:        Patient has prior history of Echocardiogram examinations, most                 recent 06/22/2023. CHF, Pacemaker and Prior CABG, chronic kidney                 disease. Covid., Arrythmias:RBBB; Risk Factors:Sleep Apnea,                 Diabetes and Hypertension.                 Aortic Valve: 29 mm Celestia  Sapien prosthetic, stented (TAVR)                 valve is present in the aortic position. Procedure Date:                 08/16/23.  Sonographer:    Tinnie Barefoot RDCS Referring Phys: [id number] FONDA JAYSON SHARPS  Sonographer Comments: Technically difficult study due to poor echo windows. Image acquisition challenging due to patient body habitus. IMPRESSIONS  1. Left ventricular ejection fraction, by estimation, is 40%. The left ventricle has mild to moderately decreased function. The left ventricle demonstrates regional wall motion abnormalities with septal and inferior severe hypokinesis. Even with Definity  contrast, images were poor. Left ventricular diastolic parameters are indeterminate.  2. Peak RV-RA gradient 33 mmHg. IVC not visualized. Right ventricular systolic function is moderately reduced. The right ventricular size is normal.  3. Left atrial size was moderately dilated.  4. Right atrial size was mildly dilated.  5. The mitral valve is normal in structure. Mild to moderate mitral valve regurgitation. No evidence of mitral stenosis.  6. Bioprosthetic aortic valve s/p TAVR with 29 mm Edwards Sapien THV. Mean gradient 8 mmHg, EOA 1.57 cm^2 with dimensionless index 0.46. No peri-valvular leakage noted. The aortic valve has been  repaired/replaced. Aortic valve regurgitation is not visualized.  7. Aortic dilatation noted. There is mild dilatation of the ascending aorta, measuring 39 mm. FINDINGS  Left Ventricle: Left ventricular ejection fraction, by estimation, is 40%. The left ventricle has mild to moderately decreased function. The left ventricle demonstrates regional wall motion abnormalities. Definity  contrast agent was given IV to delineate the left ventricular endocardial borders. The left ventricular internal cavity size was normal in size. There is no left ventricular hypertrophy. Left ventricular diastolic parameters are indeterminate. Right Ventricle: Peak RV-RA gradient 33 mmHg. IVC not visualized. The right ventricular size is normal. No increase in right ventricular wall thickness. Right ventricular systolic function is moderately reduced. Left Atrium: Left atrial size was moderately dilated. Right Atrium: Right atrial size was mildly dilated. Pericardium: There is no evidence of pericardial effusion. Mitral Valve: The mitral valve is normal in structure. Mild mitral annular calcification. Mild to moderate mitral valve regurgitation. No evidence of mitral valve stenosis. Tricuspid Valve: The tricuspid valve is normal in structure. Tricuspid valve regurgitation is trivial. Aortic Valve: Bioprosthetic aortic valve s/p TAVR with 29 mm Edwards Sapien THV. Mean gradient 8 mmHg, EOA 1.57 cm^2 with dimensionless index 0.46. No peri-valvular leakage noted. The aortic valve has been repaired/replaced. Aortic valve regurgitation is  not visualized. Aortic valve mean gradient measures 8.0 mmHg. Aortic valve peak gradient measures 13.8 mmHg. Aortic valve area, by VTI measures 1.57 cm. There is a 29 mm Edwards Sapien prosthetic, stented (TAVR) valve present in the aortic position. Procedure Date: 08/16/23. Pulmonic Valve: The pulmonic valve was normal in structure. Pulmonic valve regurgitation is trivial. Aorta: Aortic dilatation noted.  There is mild dilatation of the ascending aorta, measuring 39 mm. IAS/Shunts: No atrial level shunt detected by color flow Doppler.  LEFT VENTRICLE PLAX 2D LVIDd:         5.40 cm LVIDs:         4.00 cm LV PW:         1.10 cm LV IVS:        1.10 cm LVOT diam:     2.20 cm LV SV:         64 LV SV Index:   28 LVOT Area:  3.80 cm  RIGHT VENTRICLE RV Basal diam:  2.90 cm RV S prime:     6.64 cm/s TAPSE (M-mode): 0.9 cm LEFT ATRIUM              Index        RIGHT ATRIUM           Index LA diam:        4.80 cm  2.08 cm/m   RA Area:     21.30 cm LA Vol (A2C):   110.0 ml 47.68 ml/m  RA Volume:   58.90 ml  25.53 ml/m LA Vol (A4C):   117.0 ml 50.71 ml/m LA Biplane Vol: 121.0 ml 52.45 ml/m  AORTIC VALVE AV Area (Vmax):    1.59 cm AV Area (Vmean):   1.51 cm AV Area (VTI):     1.57 cm AV Vmax:           186.00 cm/s AV Vmean:          127.000 cm/s AV VTI:            0.410 m AV Peak Grad:      13.8 mmHg AV Mean Grad:      8.0 mmHg LVOT Vmax:         77.80 cm/s LVOT Vmean:        50.450 cm/s LVOT VTI:          0.169 m LVOT/AV VTI ratio: 0.41  AORTA Ao Root diam: 3.80 cm Ao Asc diam:  3.90 cm TRICUSPID VALVE TR Peak grad:   32.9 mmHg TR Vmax:        287.00 cm/s  SHUNTS Systemic VTI:  0.17 m Systemic Diam: 2.20 cm Dalton McleanMD Electronically signed by Ezra Kanner Signature Date/Time: 12/07/2023/9:49:35 AM    Final    DG CHEST PORT 1 VIEW Result Date: 12/07/2023 CLINICAL DATA:  Central line placement. EXAM: PORTABLE CHEST 1 VIEW COMPARISON:  Radiograph yesterday. FINDINGS: Right internal jugular central venous catheter tip overlies the upper SVC. No pneumothorax. Median sternotomy. Stable heart size and mediastinal contours. TAVR and lead less pacemaker. Pulmonary edema and small pleural effusions. IMPRESSION: 1. Right internal jugular central venous catheter tip overlies the upper SVC. No pneumothorax. 2. Pulmonary edema and small pleural effusions. Electronically Signed   By: Andrea Gasman M.D.   On: 12/07/2023  03:41   US  RENAL Result Date: 12/06/2023 CLINICAL DATA:  Acute renal insufficiency EXAM: RENAL / URINARY TRACT ULTRASOUND COMPLETE COMPARISON:  04/27/2018 FINDINGS: Right Kidney: Renal measurements: 11.8 x 5.2 x 4.3 cm = volume: 137.2 mL. Echogenicity within normal limits. No mass or hydronephrosis visualized. Left Kidney: Renal measurements: 11.7 x 5.6 x 4.5 cm = volume: 154.4 mL. Echogenicity within normal limits. No mass or hydronephrosis visualized. Bladder: Appears normal for degree of bladder distention. Other: None. IMPRESSION: 1. Unremarkable renal ultrasound. Electronically Signed   By: Ozell Daring M.D.   On: 12/06/2023 22:10   DG Chest Port 1 View Result Date: 12/06/2023 CLINICAL DATA:  SOB. EXAM: PORTABLE CHEST - 1 VIEW COMPARISON:  06/21/2023. FINDINGS: Cardiac silhouette is prominent. There is pulmonary interstitial prominence with vascular congestion. No focal consolidation. No pneumothorax. There are bilateral pleural effusions. 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 IMPRESSION: Findings suggest CHF. Electronically Signed   By: Fonda Field M.D.   On: 12/06/2023 19:13   VAS US  ABI WITH/WO TBI Result Date: 11/24/2023  LOWER EXTREMITY DOPPLER STUDY Patient Name:  Chigozie Basaldua [facility]  Date of Exam:   11/24/2023 Medical Rec #: [id number]         Accession #:    [id number] Date of Birth: [birth year]         Patient Gender: M Patient Age:   46 years Exam Location:  [facility] Procedure:      VAS US  ABI WITH/WO TBI Referring Phys: --------------------------------------------------------------------------------  Indications: Slow healiing wound of the left foot. High Risk Factors: Hypertension.  Vascular Interventions: Left transmetatarsal amputation 08/13/2021                         Right BKA 03/17/2021. Performing Technologist: Geni Lodge RVS, RCS  Examination Guidelines: A complete evaluation includes at minimum, Doppler waveform signals  and systolic blood pressure reading at the level of bilateral brachial, anterior tibial, and posterior tibial arteries, when vessel segments are accessible. Bilateral testing is considered an integral part of a complete examination. Photoelectric Plethysmograph (PPG) waveforms and toe systolic pressure readings are included as required and additional duplex testing as needed. Limited examinations for reoccurring indications may be performed as noted.  ABI Findings: +--------+------------------+-----+--------+--------+ Right   Rt Pressure (mmHg)IndexWaveformComment  +--------+------------------+-----+--------+--------+ [id number]                                      +--------+------------------+-----+--------+--------+ PTA                                    BKA      +--------+------------------+-----+--------+--------+ DP                                     BKA      +--------+------------------+-----+--------+--------+ +--------+------------------+-----+--------+----------------+ Left    Lt Pressure (mmHg)IndexWaveformComment          +--------+------------------+-----+--------+----------------+ [id number]                                              +--------+------------------+-----+--------+----------------+ PTA                            biphasicnon-compressible +--------+------------------+-----+--------+----------------+ DP      75                0.90 biphasic                 +--------+------------------+-----+--------+----------------+ +-------+-----------+-----------+------------+------------+ ABI/TBIToday's ABIToday's TBIPrevious ABIPrevious TBI +-------+-----------+-----------+------------+------------+ Right  BKA        BKA        BKA         BKA          +-------+-----------+-----------+------------+------------+ Left   [place]         amputation [place]          absent       +-------+-----------+-----------+------------+------------+  Summary:  Right: BKA. Left: Resting left ankle-brachial index indicates noncompressible left lower extremity arteries. *See table(s) above for measurements and observations.  Electronically signed by Norman Serve on 11/24/2023 at 1:20:48 PM.    Final     Labs:  CBC: Recent Labs    12/07/23 0116 12/07/23 2241 12/08/23 9644  12/09/23 0348  WBC 7.7 13.2* 13.2* 15.1*  HGB 9.1* 9.2* 9.1* 8.3*  HCT 28.6* 27.7* 27.3* 25.6*  PLT 175 168 178 197    COAGS: Recent Labs    12/06/23 1840 12/07/23 0405 12/07/23 2241 12/08/23 0901 12/08/23 1609 12/09/23 0348  INR 1.6*  --   --   --   --   --   APTT 37*   < > 133* 94* 86* 114*   < > = values in this interval not displayed.    BMP: Recent Labs    12/07/23 1554 12/08/23 0355 12/08/23 1609 12/09/23 0348  NA 132* 134* 135 133*  K 4.9 5.2* 4.3 4.9  CL 94* 97* 99 99  CO2 22 24 26 25   GLUCOSE 163* 170* 113* 186*  BUN 75* 53* 41* 56*  CALCIUM  8.2* 8.5* 8.5* 8.2*  CREATININE 2.72* 1.66* 1.42* 1.61*  GFRNONAA 23* 41* 49* 42*    LIVER FUNCTION TESTS: Recent Labs    02/09/23 1440 06/21/23 1027 06/22/23 0232 06/22/23 2341 12/06/23 1840 12/07/23 0500 12/07/23 1554 12/08/23 0355 12/08/23 1609 12/09/23 0348  BILITOT 0.4 0.6 0.9  --  0.6  --   --   --   --   --   AST 20 19 17   --  45*  --   --   --   --   --   ALT 19 21 18   --  16  --   --   --   --   --   ALKPHOS 121* 121 98  --  85  --   --   --   --   --   PROT 7.0 7.0 6.3*  --  6.8  --   --   --   --   --   ALBUMIN  3.6 3.1* 2.8*   < > 2.9*   < > 2.7* 2.7* 2.8* 2.6*   < > = values in this interval not displayed.    TUMOR MARKERS: No results for input(s): AFPTM, CEA, CA199, CHROMGRNA in the last 8760 hours.  Assessment and Plan:  Mr Bleier is 83 yo male with AC associated right side retroperitoneal hemorrhage, occurring while therapeutic on hep ggt -- treatment for NSTEMI + COVID 19, as well as afib (home eliquis ). For comorbid CV conditions he was also on  antiplatelet/plavix , and potentially ASA (triple therapy).   At this time he has been reversed with protamine , has received 1 unit of platelets, and is hemodynamically normal.    His main complaint is abd pain.   He has acute renal insufficiency, superimposed on CKD.   I had a discussion with Mr Signorelli regarding the natural history of retroperitoneal hemorrhage in this scenario, associated with AC/anti-platelet therapy.  Once stopped, these are contained bleeds in a high pressure/high resistance space, and typically will cease.  Treatment is typically non-surgical, medical support, and potentially angiogram and possible embolization if does not stop by itself.  I also discussed the risks of angiogram for him, which are elevated because of the location (multiple segmental/lumbar arteries would need to be evaluated, as well as formal aortogram), and the likely need for high contrast dose, which would put him at risk for CIN.  His renal function is just recovering from his admission.   VIR would advise continued medical support and observe, and attempt to withhold angiogram to protect kidneys.   Mr Couper understands.  He expresses to me he is a full code.   Acute retroperitoneal  hemorrhage, associated with AC/anti-platelet therapy - trend H/H - anticipate will stop given contained space - VIR will follow, on-call available should he have status change  Anemia - anemic on admission, with superimposed blood loss - suggest transfuse to maintain 8-9, given cardiac history/renal function  CV disease, PAD, CAD - hold ASA/plavix  for hemorrhage - glucose control, HgbA1C is 8.5 - currently normotensive to low BP - anti-lipid therapy  Nutrition - admission albumin  2.9.   - no reason for NPO from VIR standpoint   Thank you for this interesting consult.  I greatly enjoyed meeting [name] and look forward to participating in their care.  A copy of this report was sent to the requesting  provider on this date.  Electronically Signed: Ami Bellman, DO 12/09/2023, 6:31 AM   I spent a total of 55 Miinutes    discussing with staff, reviewing notes, reviewing labs, reviewing imaging, discussion with patient/in face to face in clinical consultation, greater than 50% of which was counseling/coordinating care for retroperitoneal hemorrhage and possible angiogram/embolization.

## 2023-12-09 NOTE — Progress Notes (Signed)
 Progress Note  Patient Name: Shane [name] Date of Encounter: 12/09/2023  Primary Cardiologist: Redell Shallow, MD   Subjective   Denies anginal chest pain. Abdominal pain is improved significantly. Overnight he had severe abdominal discomfort CT imaging noted acute active retroperitoneal bleed Status post blood transfusion and platelets Anticoagulation reversed. Plan discussed with wife at bedside, nursing staff, intensivist  Inpatient Medications    Scheduled Meds:  sodium chloride    Intravenous Once   atorvastatin   80 mg Oral Daily   Chlorhexidine  Gluconate Cloth  6 each Topical Daily   dexamethasone  (DECADRON ) injection  6 mg Intravenous Q24H   docusate sodium   100 mg Oral BID   escitalopram   10 mg Oral Daily   feeding supplement (NEPRO CARB STEADY)  237 mL Oral BID BM   gabapentin   300 mg Oral BID   HYDROmorphone    Intravenous Q4H   insulin  aspart  0-20 Units Subcutaneous TID WC   insulin  aspart  0-5 Units Subcutaneous QHS   insulin  glargine-yfgn  10 Units Subcutaneous Daily   multivitamin  1 tablet Oral QHS   pantoprazole   40 mg Oral Daily   sodium zirconium cyclosilicate   10 g Oral Daily   Continuous Infusions:  sodium chloride  10 mL/hr at 12/09/23 0829   acetaminophen  Stopped (12/09/23 0433)   desmopressin  (DDAVP ) 20 mcg in sodium chloride  0.9 % 50 mL IVPB     prismasol  BGK 2/2.5 dialysis solution 1,500 mL/hr at 12/08/23 1247   prismasol  BGK 2/2.5 replacement solution 400 mL/hr at 12/08/23 1110   prismasol  BGK 2/2.5 replacement solution 400 mL/hr at 12/08/23 0912   PRN Meds: Place/Maintain arterial line **AND** sodium chloride , acetaminophen , acetaminophen , diphenhydrAMINE  **OR** diphenhydrAMINE , guaiFENesin , heparin , heparin  sodium (porcine), heparin , ipratropium-albuterol , melatonin, naloxone  **AND** sodium chloride  flush, ondansetron  (ZOFRAN ) IV, mouth rinse, phenol, polyethylene glycol, traZODone    Vital Signs    Vitals:   12/09/23 0745 12/09/23 0755  12/09/23 0800 12/09/23 0815  BP: (!) 112/55  (!) 108/52 93/66  Pulse: 81  70 71  Resp: 16 10 11 11   Temp:      TempSrc:      SpO2: 100% 98% 100% 100%  Weight:      Height:        Intake/Output Summary (Last 24 hours) at 12/09/2023 0900 Last data filed at 12/09/2023 0500 Gross per 24 hour  Intake 605.98 ml  Output 1231 ml  Net -625.02 ml   Filed Weights   12/07/23 0000 12/07/23 0344 12/08/23 0500  Weight: 109.4 kg 109.4 kg 108.6 kg    Telemetry    Paced rhythm- Personally Reviewed  ECG    No new EKG since last rounding- Personally Reviewed  Physical Exam     General: Comfortable, resting in bed comfortably, hemodynamically stable Head: Atraumatic, normal size  Eyes: PEERLA, EOMI  Neck: Supple, normal JVD, central line present. Cardiac: Normal S1, S2; RRR; no murmurs, rubs, or gallops, sternotomy site is well-healed Lungs: Clear to auscultation bilaterally, nasal cannula oxygen Abd: Soft, nontender, no hepatomegaly  Ext: warm, no edema Musculoskeletal: Right BKA, left TMA, warm to touch bilaterally, no edema Skin: Warm and dry, no rashes   Neuro: Alert and oriented to person, place, time, and situation, CNII-XII grossly intact, no focal deficits  Psych: Normal mood and affect   Labs    Chemistry Recent Labs  Lab 12/06/23 1840 12/06/23 2020 12/08/23 0355 12/08/23 1609 12/09/23 0348  NA 130*   < > 134* 135 133*  K 7.1*   < >  5.2* 4.3 4.9  CL 100   < > 97* 99 99  CO2 16*   < > 24 26 25   GLUCOSE 165*   < > 170* 113* 186*  BUN 116*   < > 53* 41* 56*  CREATININE 4.69*   < > 1.66* 1.42* 1.61*  CALCIUM  7.9*   < > 8.5* 8.5* 8.2*  PROT 6.8  --   --   --   --   ALBUMIN  2.9*   < > 2.7* 2.8* 2.6*  AST 45*  --   --   --   --   ALT 16  --   --   --   --   ALKPHOS 85  --   --   --   --   BILITOT 0.6  --   --   --   --   GFRNONAA 12*   < > 41* 49* 42*  ANIONGAP 14   < > 13 10 9    < > = values in this interval not displayed.     Hematology Recent Labs  Lab  12/07/23 2241 12/08/23 0355 12/09/23 0348  WBC 13.2* 13.2* 15.1*  RBC 3.39* 3.31* 3.05*  HGB 9.2* 9.1* 8.3*  HCT 27.7* 27.3* 25.6*  MCV 81.7 82.5 83.9  MCH 27.1 27.5 27.2  MCHC 33.2 33.3 32.4  RDW 18.9* 19.0* 19.2*  PLT 168 178 197    Cardiac EnzymesNo results for input(s): TROPONINI in the last 168 hours. No results for input(s): TROPIPOC in the last 168 hours.   BNP Recent Labs  Lab 12/06/23 2310  BNP 480.6*     DDimer No results for input(s): DDIMER in the last 168 hours.   Radiology    CT ABDOMEN PELVIS W CONTRAST Result Date: 12/09/2023 CLINICAL DATA:  Right lower quadrant abdominal pain EXAM: CT ABDOMEN AND PELVIS WITH CONTRAST TECHNIQUE: Multidetector CT imaging of the abdomen and pelvis was performed using the standard protocol following bolus administration of intravenous contrast. RADIATION DOSE REDUCTION: This exam was performed according to the departmental dose-optimization program which includes automated exposure control, adjustment of the mA and/or kV according to patient size and/or use of iterative reconstruction technique. CONTRAST:  75mL OMNIPAQUE  IOHEXOL  350 MG/ML SOLN COMPARISON:  CT abdomen and pelvis 04/27/2018 FINDINGS: Lower chest: Peribronchovascular and peripheral ground-glass and reticular opacities compatible with interstitial lung disease. Small bilateral pleural effusions. Coronary artery atherosclerosis.  TAVR. Hepatobiliary: No acute abnormality. Pancreas: Unremarkable. Spleen: Unremarkable. Adrenals/Urinary Tract: Normal adrenal glands. No urinary calculi or hydronephrosis. Foley catheter and gas within the decompressed bladder. Stomach/Bowel: Normal caliber large and small bowel. No bowel wall thickening. Colonic diverticulosis without diverticulitis. Stomach and appendix are within normal limits. Vascular/Lymphatic: Right retroperitoneal hematoma measures 7.4 x 6.2 x 10.2 cm. Foci of hyperintensity on initial phase imaging (series 3/image [id number])  with diffusion on delayed phase images compatible with active hemorrhage. Blood tracks inferiorly along the right psoas. Reproductive: No acute abnormality. Other: No free intraperitoneal air. Musculoskeletal: No acute fracture. Chronic bilateral L5 pars defects. IMPRESSION: 1. Right retroperitoneal hematoma with focus of active hemorrhage. 2. Chronic interstitial lung disease. Small bilateral pleural effusions. Superimposed edema or infection is difficult to exclude. These results were called by telephone at the time of interpretation on 12/09/2023 at 3:53 am to provider ADITYA PALIWAL , who verbally acknowledged these results. Electronically Signed   By: Norman Gatlin M.D.   On: 12/09/2023 03:54    Cardiac Studies   Echo Care Everywhere 08/17/2023  MILD LV  DYSFUNCTION (See above) WITH MILD LVH    MILD RV SYSTOLIC DYSFUNCTION (See above)    VALVULAR REGURGITATION: TRIVIAL MR, TRIVIAL PR, MILD TR    POOR SOUND TRANSMISSION DESPITE THE USE OF DEFINITY     LVEF APPEARS 45-50%    TAVR APPEARS WELL SEATED   Echo 12/07/2023  1. Left ventricular ejection fraction, by estimation, is 40%. The left ventricle has mild to moderately decreased function. The left ventricle demonstrates regional wall motion abnormalities with septal and inferior severe hypokinesis. Even with  Definity  contrast, images were poor. Left ventricular diastolic parameters  are indeterminate.   2. Peak RV-RA gradient 33 mmHg. IVC not visualized. Right ventricular  systolic function is moderately reduced. The right ventricular size is  normal.   3. Left atrial size was moderately dilated.   4. Right atrial size was mildly dilated.   5. The mitral valve is normal in structure. Mild to moderate mitral valve  regurgitation. No evidence of mitral stenosis.   6. Bioprosthetic aortic valve s/p TAVR with 29 mm Edwards Sapien THV.  Mean gradient 8 mmHg, EOA 1.57 cm^2 with dimensionless index 0.46. No  peri-valvular leakage noted. The  aortic valve has been repaired/replaced.  Aortic valve regurgitation is not  visualized.   7. Aortic dilatation noted. There is mild dilatation of the ascending  aorta, measuring 39 mm.   Patient Profile     83 y.o. 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  Assessment & Plan    NSTEMI-likely supply demand ischemia in the setting of COVID-19 infection, underlying cardiomyopathy CAD/CABG Troponins peaked at 8290 EKG early on admission did not meet STEMI criteria. Echocardiogram noted mildly reduced LVEF with regional wall motion abnormalities. Shared decision was to treat him medically with IV heparin  for 48 hours and continue medical therapy. Agree with holding heparin  for now. Monitor clinically  Acute retroperitoneal bleed CT abdomen pelvis with contrast December 09, 2023 Status post blood transfusion, platelet transfusion, as well as protamine . Intensivist concerned with regards to further bleed-blood bank would not release platelets morning platelet levels 188K. Shared decision was to give DDAVP  Continue to monitor H&H.  COVID-19 infection: Currently on nasal cannula oxygen.  Managed by CCM colleagues  Status post TAVR September 2024 at [facility] health: Based on records from Care Everywhere he had a TAVR in September 2024. Was on antiplatelet therapy post TAVR which now has to be held secondary to acute retroperitoneal bleed. Ideally would like to restart antiplatelets when clinically safe and cleared by IR and surgical team due to recent TAVR implant.  Will need outpatient follow-up with his cardiologist at [facility].  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   Will uptitrate  antianginal therapy and GDMT based on clinical trajectory. Reemphasized the importance of secondary prevention with focus on improving her modifiable cardiovascular risk factors such as glycemic control, lipid management, blood pressure control, weight loss.  Chronic HFpEF:  Stage C, NYHA class II.  Continue current medical therapy  Atrial fibrillation:  On rate control strategy and Eliquis  for thromboembolic prophylaxis at home.  Eliquis  held secondary to retroperitoneal bleed.  History of complete heart block, sick sinus syndrome, pacemaker implant: Telemetry notes paced rhythm.  Hyperlipidemia: Continue Lipitor  80 mg p.o. daily.  CRITICAL CARE Performed by: Madonna Large   Total critical care time: 38 minutes   Critical care time was exclusive of separately billable procedures and treating other patients.   Critical care was necessary to treat or prevent imminent or life-threatening deterioration.   Critical care was time spent personally by me on the following activities: development of treatment plan with patient and/or surrogate as well as nursing, discussions with consultants, evaluation of patient's response to treatment, examination of patient, obtaining history from patient or surrogate, ordering and performing treatments and interventions, ordering and review of laboratory studies, ordering and review of radiographic studies, pulse oximetry and re-evaluation of patient's condition.  Madonna Large, DO, [facility]  [facility]  [address] [place], [place] [address] Pager: [phone] Office: [phone] 12/09/23 10:09 AM

## 2023-12-09 NOTE — Progress Notes (Signed)
    Has dropped Hgb < 7gm% - known cardiac issue + high trop this admit + bleeding RP  High K - got lokelma   Rising Creat  DDAVP  just given  Plan  - PRBC 1 unit - might need fluids - renal to decide     SIGNATURE    Dr. Dorethia Cave, M.D., F.C.C.P,  Pulmonary and Critical Care Medicine Staff Physician, [facility] Center Director - Interstitial Lung Disease  Program  Pulmonary Fibrosis [facility] at [place], [place], [address]   Pager: [phone], If no answer  -> Check AMION or Try [phone] Telephone (clinical office): [phone] Telephone (research): [phone]  10:44 AM 12/09/2023     Recent Labs  Lab 12/07/23 0116 12/07/23 2241 12/08/23 0355 12/09/23 0348 12/09/23 0929  HGB 9.1* 9.2* 9.1* 8.3* 7.6*    Recent Labs  Lab 12/06/23 2310 12/07/23 0116 12/07/23 0405 12/07/23 0500 12/07/23 0949 12/07/23 1554 12/08/23 0355 12/08/23 1609 12/09/23 0348 12/09/23 0929  NA  --  128*  --  130*  --  132* 134* 135 133* 132*  K 6.2* 6.3*  --  6.7*   < > 4.9 5.2* 4.3 4.9 5.7*  CL  --  94*  --  96*  --  94* 97* 99 99 97*  CO2  --  15*  --  17*  --  22 24 26 25 25   GLUCOSE  --  282*  --  321*  --  163* 170* 113* 186* 215*  BUN  --  122*  --  123*  --  75* 53* 41* 56* 62*  CREATININE 5.16* 5.29*  --  5.23*  --  2.72* 1.66* 1.42* 1.61* 1.92*  CALCIUM   --  8.2*  --  8.2*  --  8.2* 8.5* 8.5* 8.2* 8.1*  MG 1.8 1.8 1.9  --   --   --  2.2  --  2.3  --   PHOS 7.6* 7.3*  --  6.8*  --  4.2 3.8 3.5 4.7*  --    < > = values in this interval not displayed.

## 2023-12-09 NOTE — Progress Notes (Signed)
   PM rounds  Pain better after PCA Sleeping but aroussble Looks better Wife at bedside  CRRT ongoing Oriented x 3 BP good  RN says barely making urine  Plan  - check cbc again 9pm  - will check with renal if he needs volme     SIGNATURE    Dr. Dorethia Cave, M.D., F.C.C.P,  Pulmonary and Critical Care Medicine Staff Physician, [facility] Center Director - Interstitial Lung Disease  Program  Pulmonary Fibrosis [facility] [place], [place], [address]   Pager: [phone], If no answer  -> Check AMION or Try [phone] Telephone (clinical office): [phone] Telephone (research): [phone]  5:51 PM 12/09/2023

## 2023-12-10 DIAGNOSIS — J9621 Acute and chronic respiratory failure with hypoxia: Secondary | ICD-10-CM

## 2023-12-10 DIAGNOSIS — I5032 Chronic diastolic (congestive) heart failure: Secondary | ICD-10-CM | POA: Diagnosis not present

## 2023-12-10 DIAGNOSIS — I214 Non-ST elevation (NSTEMI) myocardial infarction: Secondary | ICD-10-CM | POA: Diagnosis not present

## 2023-12-10 DIAGNOSIS — D649 Anemia, unspecified: Secondary | ICD-10-CM

## 2023-12-10 DIAGNOSIS — R58 Hemorrhage, not elsewhere classified: Secondary | ICD-10-CM | POA: Diagnosis not present

## 2023-12-10 DIAGNOSIS — N179 Acute kidney failure, unspecified: Secondary | ICD-10-CM | POA: Diagnosis not present

## 2023-12-10 DIAGNOSIS — I480 Paroxysmal atrial fibrillation: Secondary | ICD-10-CM | POA: Diagnosis not present

## 2023-12-10 LAB — PREPARE RBC (CROSSMATCH)

## 2023-12-10 LAB — COMPREHENSIVE METABOLIC PANEL
ALT: 26 U/L (ref 0–44)
AST: 53 U/L — ABNORMAL HIGH (ref 15–41)
Albumin: 2.5 g/dL — ABNORMAL LOW (ref 3.5–5.0)
Alkaline Phosphatase: 64 U/L (ref 38–126)
Anion gap: 9 (ref 5–15)
BUN: 43 mg/dL — ABNORMAL HIGH (ref 8–23)
CO2: 25 mmol/L (ref 22–32)
Calcium: 7.6 mg/dL — ABNORMAL LOW (ref 8.9–10.3)
Chloride: 100 mmol/L (ref 98–111)
Creatinine, Ser: 1.58 mg/dL — ABNORMAL HIGH (ref 0.61–1.24)
GFR, Estimated: 43 mL/min — ABNORMAL LOW (ref >60)
Glucose, Bld: 118 mg/dL — ABNORMAL HIGH (ref 70–99)
Potassium: 4.4 mmol/L (ref 3.5–5.1)
Sodium: 134 mmol/L — ABNORMAL LOW (ref 135–145)
Total Bilirubin: 0.9 mg/dL (ref 0.0–1.2)
Total Protein: 5.5 g/dL — ABNORMAL LOW (ref 6.5–8.1)

## 2023-12-10 LAB — CBC
HCT: 23.7 % — ABNORMAL LOW (ref 39.0–52.0)
Hemoglobin: 7.8 g/dL — ABNORMAL LOW (ref 13.0–17.0)
MCH: 27.4 pg (ref 26.0–34.0)
MCHC: 32.9 g/dL (ref 30.0–36.0)
MCV: 83.2 fL (ref 80.0–100.0)
Platelets: 168 10*3/uL (ref 150–400)
RBC: 2.85 MIL/uL — ABNORMAL LOW (ref 4.22–5.81)
RDW: 18.6 % — ABNORMAL HIGH (ref 11.5–15.5)
WBC: 12.3 10*3/uL — ABNORMAL HIGH (ref 4.0–10.5)
nRBC: 0 % (ref 0.0–0.2)

## 2023-12-10 LAB — BPAM PLATELET PHERESIS
Blood Product Expiration Date: 202501042359
Blood Product Expiration Date: 202501052359
ISSUE DATE / TIME: 202501040519
Unit Type and Rh: 5100
Unit Type and Rh: 6200

## 2023-12-10 LAB — HEMOGLOBIN AND HEMATOCRIT, BLOOD
HCT: 23.4 % — ABNORMAL LOW (ref 39.0–52.0)
HCT: 24.2 % — ABNORMAL LOW (ref 39.0–52.0)
HCT: 25.5 % — ABNORMAL LOW (ref 39.0–52.0)
HCT: 26.8 % — ABNORMAL LOW (ref 39.0–52.0)
HCT: 26.9 % — ABNORMAL LOW (ref 39.0–52.0)
Hemoglobin: 7.8 g/dL — ABNORMAL LOW (ref 13.0–17.0)
Hemoglobin: 7.8 g/dL — ABNORMAL LOW (ref 13.0–17.0)
Hemoglobin: 8.3 g/dL — ABNORMAL LOW (ref 13.0–17.0)
Hemoglobin: 8.7 g/dL — ABNORMAL LOW (ref 13.0–17.0)
Hemoglobin: 8.9 g/dL — ABNORMAL LOW (ref 13.0–17.0)

## 2023-12-10 LAB — PREPARE PLATELET PHERESIS
Unit division: 0
Unit division: 0

## 2023-12-10 LAB — PHOSPHORUS: Phosphorus: 4.5 mg/dL (ref 2.5–4.6)

## 2023-12-10 LAB — GLUCOSE, CAPILLARY
Glucose-Capillary: 107 mg/dL — ABNORMAL HIGH (ref 70–99)
Glucose-Capillary: 120 mg/dL — ABNORMAL HIGH (ref 70–99)
Glucose-Capillary: 117 mg/dL — ABNORMAL HIGH (ref 70–99)
Glucose-Capillary: 105 mg/dL — ABNORMAL HIGH (ref 70–99)
Glucose-Capillary: 100 mg/dL — ABNORMAL HIGH (ref 70–99)

## 2023-12-10 LAB — RENAL FUNCTION PANEL
Albumin: 2.6 g/dL — ABNORMAL LOW (ref 3.5–5.0)
Anion gap: 10 (ref 5–15)
BUN: 37 mg/dL — ABNORMAL HIGH (ref 8–23)
CO2: 25 mmol/L (ref 22–32)
Calcium: 7.7 mg/dL — ABNORMAL LOW (ref 8.9–10.3)
Chloride: 97 mmol/L — ABNORMAL LOW (ref 98–111)
Creatinine, Ser: 1.65 mg/dL — ABNORMAL HIGH (ref 0.61–1.24)
GFR, Estimated: 41 mL/min — ABNORMAL LOW (ref >60)
Glucose, Bld: 126 mg/dL — ABNORMAL HIGH (ref 70–99)
Phosphorus: 4.8 mg/dL — ABNORMAL HIGH (ref 2.5–4.6)
Potassium: 4.2 mmol/L (ref 3.5–5.1)
Sodium: 132 mmol/L — ABNORMAL LOW (ref 135–145)

## 2023-12-10 LAB — TROPONIN I (HIGH SENSITIVITY): Troponin I (High Sensitivity): 1961 ng/L (ref <18)

## 2023-12-10 LAB — MAGNESIUM: Magnesium: 2.5 mg/dL — ABNORMAL HIGH (ref 1.7–2.4)

## 2023-12-10 LAB — LACTIC ACID, PLASMA: Lactic Acid, Venous: 1.1 mmol/L (ref 0.5–1.9)

## 2023-12-10 MED ORDER — SODIUM CHLORIDE 0.9 % IV BOLUS
500.0000 mL | Freq: Once | INTRAVENOUS | Status: AC
  Administered 2023-12-09: 500 mL via INTRAVENOUS

## 2023-12-10 MED ORDER — SODIUM CHLORIDE 0.9% IV SOLUTION: Freq: Once | INTRAVENOUS | Status: AC

## 2023-12-10 MED ORDER — SODIUM CHLORIDE 0.9 % IV SOLN: INTRAVENOUS | Status: AC | PRN

## 2023-12-10 NOTE — Progress Notes (Signed)
 Shane [name], Shane be able to stop tomorrow - close attention to K  2.  Hyperkalemia:  - corrected - diet education provided to pt and wife - lokelma  daily  3.  Anion gap metabolic acidosis:  - CRRT will correct this  4.  Shock: Appears to be largely cardiogenic but cannot rule out septic component with presentation symptoms suggestive of LRTI.  He has been started on antibiotic coverage for community-acquired pneumonia.  5.  Acute hypoxic respiratory failure: Appears to be from a combination of CHF exacerbation +/- underlying interstitial lung disease and now LRTI/community-acquired pneumonia.  On oxygen supplementation and antimicrobial coverage.  6.  Anemia: Monitor hemoglobin/hematocrit trend anticipating some improvement with diuresis.  7,  RP Bleed:  - per PCCM  - getting plts and blood    Subjective:    Back on CRRT, getting more blood this AM.     Objective:   BP 106/83   Pulse 71   Temp 98 F (36.7 C) (Axillary)   Resp (!) 8   Ht 6' (1.829 m)   Wt 107.9 kg   SpO2 99%   BMI 32.26 kg/m   Intake/Output Summary (Last 24 hours) at 12/10/2023 1243 Last data filed at 12/10/2023 1200 Gross per 24 hour  Intake 2514.72 ml  Output 675 ml  Net 1839.72 ml   Weight change:   Physical Exam: Hzw:noizm gentleman,better appearing CVS: RRR now Resp: Clear Abd: soft Ext: + multiple bruises, s/p R BKA with L TMT and + ulcer ACCESS: R internal jugular nontunneled HD cath  Imaging: CT ABDOMEN PELVIS W CONTRAST Result Date: 12/09/2023 CLINICAL DATA:  Right lower quadrant abdominal pain EXAM: CT ABDOMEN AND PELVIS WITH CONTRAST TECHNIQUE: Multidetector CT imaging of the abdomen and pelvis was  performed using the standard protocol following bolus administration of intravenous contrast. RADIATION DOSE REDUCTION: This exam was performed according to the departmental dose-optimization program which includes automated exposure control, adjustment of the mA and/or kV according to patient size and/or use of iterative reconstruction technique. CONTRAST:  75mL OMNIPAQUE  IOHEXOL  350 MG/ML SOLN COMPARISON:  CT abdomen and pelvis 04/27/2018 FINDINGS: Lower chest: Peribronchovascular and peripheral ground-glass and reticular opacities compatible with interstitial lung disease. Small bilateral pleural effusions. Coronary artery atherosclerosis.  TAVR. Hepatobiliary: No acute abnormality. Pancreas: Unremarkable. Spleen: Unremarkable. Adrenals/Urinary Tract: Normal adrenal glands. No urinary calculi or hydronephrosis. Foley catheter and gas within the decompressed bladder. Stomach/Bowel: Normal caliber large and small bowel. No bowel wall thickening. Colonic diverticulosis without diverticulitis. Stomach and appendix are within normal limits. Vascular/Lymphatic: Right retroperitoneal hematoma measures 7.4 x 6.2 x 10.2 cm. Foci of hyperintensity on initial phase imaging (series 3/image [id number]) with diffusion on delayed phase images compatible with active hemorrhage. Blood tracks inferiorly along the right psoas. Reproductive: No acute abnormality. Other: No free intraperitoneal air. Musculoskeletal: No acute fracture. Chronic bilateral L5 pars defects. IMPRESSION: 1. Right retroperitoneal hematoma with focus of active hemorrhage. 2. Chronic interstitial lung disease. Small bilateral pleural effusions. Superimposed edema or infection is difficult to exclude. These results were called by telephone at the time of interpretation on 12/09/2023 at 3:53 am to provider ADITYA PALIWAL , who verbally acknowledged these results. Electronically Signed   By:  Norman Gatlin M.D.   On: 12/09/2023 03:54    Labs: BMET Recent Labs   Lab 12/07/23 0500 12/07/23 0949 12/07/23 1554 12/08/23 0355 12/08/23 1609 12/09/23 0348 12/09/23 0929 12/09/23 1606 12/10/23 0416  NA 130*  --  132* 134* 135 133* 132* 133* 134*  K 6.7*   < > 4.9 5.2* 4.3 4.9 5.7* 4.8 4.4  CL 96*  --  94* 97* 99 99 97* 98 100  CO2 17*  --  22 24 26 25 25 25 25   GLUCOSE 321*  --  163* 170* 113* 186* 215* 175* 118*  BUN 123*  --  75* 53* 41* 56* 62* 63* 43*  CREATININE 5.23*  --  [name]72* 1.66* 1.42* 1.61* 1.92* 1.86* 1.58*  CALCIUM  8.2*  --  8.2* 8.5* 8.5* 8.2* 8.1* 8.0* 7.6*  PHOS 6.8*  --  4.2 3.8 3.5 4.7*  --  6.0* 4.5   < > = values in this interval not displayed.   CBC Recent Labs  Lab 12/06/23 1840 12/06/23 2311 12/09/23 0348 12/09/23 0929 12/09/23 1606 12/09/23 2109 12/09/23 2354 12/10/23 0416 12/10/23 0904  WBC 8.8   < > 15.1* 12.0*  --  11.8*  --  12.3*  --   NEUTROABS 6.7  --   --   --   --   --   --   --   --   HGB 9.5*   < > 8.3* 7.6*   < > 8.0* 7.8* 7.8* 7.8*  HCT 30.1*   < > 25.6* 23.4*   < > 24.5* 23.4* 23.7* 24.2*  MCV 86.2   < > 83.9 84.2  --  82.8  --  83.2  --   PLT 192   < > 197 188  --  174  --  168  --    < > = values in this interval not displayed.    Medications:     atorvastatin   80 mg Oral Daily   Chlorhexidine  Gluconate Cloth  6 each Topical Daily   dexamethasone  (DECADRON ) injection  6 mg Intravenous Q24H   docusate sodium   100 mg Oral BID   escitalopram   10 mg Oral Daily   feeding supplement (NEPRO CARB STEADY)  237 mL Oral BID BM   gabapentin   300 mg Oral BID   HYDROmorphone    Intravenous Q4H   insulin  aspart  0-20 Units Subcutaneous TID WC   insulin  aspart  0-5 Units Subcutaneous QHS   insulin  glargine-yfgn  10 Units Subcutaneous Daily   multivitamin  1 tablet Oral QHS   pantoprazole   40 mg Oral Daily   sodium zirconium cyclosilicate   10 g Oral TID    Almarie Bonine MD 12/10/2023, 12:43 PM

## 2023-12-10 NOTE — Progress Notes (Signed)
 NAME:  Armonie Staten, MRN:  [id number], DOB:  [birth year], LOS: 4 ADMISSION DATE:  12/06/2023, CONSULTATION DATE:  12/06/2023 REFERRING MD:  Ula, EDP, CHIEF COMPLAINT:  aki, hypotension  History of Present Illness:  83 year old male with significant past medical history including obstructive sleep apnea on CPAP at night, CAD s/p CABG x3 in 2021, aortic stenosis s/p TAVR 08/2023 at [facility], HFpEF, atrial fibrillation on Eliquis , plavix , sick sinus syndrome s/p leadless PPM, hypertension, hyperlipidemia, GERD, uncontrolled diabetes, CKD 3, pulmonary fibrosis followed by Dr. Geronimo, who presented to the emergency department on 12/06/2023 with shortness of breath.  Per the ED provider complaining of cough and shortness of breath over the past 3 days.  Using his nebulizers at home with no improvement in his symptoms.  EMS was called and found to be hypotensive and hypoxic.  He was started on nonrebreather and an epinephrine  infusion prior to arrival.  In the ED afebrile, SBP around 100, tachypneic.  Labs +COVID, AKI on CKD with BUN/Cr 116, 4.69 with critical K of 7.1. bicarb 16. CBC without leukocytosis, hgb 9.5. lactic 2.3>1.9, troponin 8,290, UA negative, blood cultures pending. CXR with likely pulmonary edema. With new AKI, hyperkalemia and EKG changes, nephrology was consulted. Patient given lokelma , albuterol , Lasix , sodium bicarb, 5U insulin  with D50, calcium  gluconate. Was also given azithromycin /rocephin  CAP coverage. Neprhology recommended albumin  for BP support (initially on 2mcg levo, now off), Lasix  80mg  IV, renal ultrasound. Pending repeat BMP on consult for admission to ICU.  On my exam, laying in bed, mildly tachypneic on albuterol  treatment. Alert and oriented. Endorses the above story. He does state he had diarrhea over the preceding 2 days and intermittent nausea without vomiting. Decreased appetite. He does feel volume overloaded from his baseline. States his abdomen feels tighter. Wife is at  bedside, endorses story.   Pertinent  Medical History  obstructive sleep apnea on CPAP at night, CAD s/p CABG x3 in 2021, aortic stenosis s/p TAVR 08/2023 at [facility], HFpEF, atrial fibrillation on Eliquis , plavix , sick sinus syndrome s/p leadless PPM, hypertension, hyperlipidemia, GERD, uncontrolled diabetes, CKD 3, pulmonary fibrosis  Significant Hospital Events: Including procedures, antibiotic start and stop dates in addition to other pertinent events   1/1: Admit to CCM for covid pneumonia, aki with hyperK likely needing CRRT  1/2: CRRT initiated, Card Consult due to elev Troponin (Nstemi vs Demand Ischemia)  1/3: Continuing CRRT till this afternoon, PCCM sign off this afternoon, trx to [facility] tomorrow  1/4 RPB -- multipl PRBC. CCS and Ir consults. Medical mgmnt.  1/5 1 PRBC  Interim History / Subjective:  Slept better last night Pain is a lot better   Objective   Blood pressure 104/64, pulse 63, temperature 97.6 F (36.4 C), temperature source Oral, resp. rate 10, height 6' (1.829 m), weight 107.9 kg, SpO2 100%.    FiO2 (%):  [100 %] 100 %   Intake/Output Summary (Last 24 hours) at 12/10/2023 1104 Last data filed at 12/10/2023 1000 Gross per 24 hour  Intake 3433.89 ml  Output 555 ml  Net 2878.89 ml   Filed Weights   12/07/23 0344 12/08/23 0500 12/10/23 0500  Weight: 109.4 kg 108.6 kg 107.9 kg    Examination: General: chronically ill appearing elderly M NAD  HENT: CPAP in place. NCAT  Lungs: Clear, symmetrical chest expansion Cardiovascular: rr s1s2 cap refill < 3 sec Abdomen: round, tender, tight  Extremities: R BKA L TMA  Neuro: AAOx4 following commands  GU: foley   Resolved Hospital Problem  list    Assessment & Plan:   Acute on chronic hypoxic respiratory failure, multifactorial Pulmonary edema 2/2 HF exacerbation   COVID -19 infection OSA  Pulmonary fibrosis  -s/p remdes.  P -cont decadron    -noct CPAP -O2 for SpO2 > 92 (at home is on 2L)  -IS, pulm hygiene    ABLA 2/2 RPB superimposed on chronic anemia  -s/p protamine , DDAVP  and PRBC   P -antiplt and hep gtt stopped  -- hopefully resume antiplt 1/6  -transfuse for hgb goal >8  -- 1 PRBC 1/5  -has been eval by CCS and IR -- if decompensates would re-engage IR but agree with recs re medical management as most RPB are self-limited   AoC HFpEF, BiV dysfunction   Elevated trops  MV Regurg  CAD s/p CABG  Afib on eliquis  SSS s/p leadless ppm AS s/p TAVR  HTN HLD  P -in setting of RPB above, anticoagulation and antiplt are held -- when clinically safe resume antiplt (recent TAVR)  -- d/w cardiology  -GDMT  -lipitor    AKI on  CKD  Hyperkalemia  NAGMA  P -CRRT per nephro -- will ask about duration of CRRT, wondering if we can trial iHD soon  -follow renal indices -follow [name]   DM2 with hyperglycemia  P -rSSI + semglee  10 units   Dysphagia  P:  -GI to follow this admission; eval for EGD/dilation was acutely deferred in setting of his RBP 1/4  -1/5 doing sips/chips    Best Practice (right click and Reselect all SmartList Selections daily)   Diet/type: Reg, thin DVT prophylaxis:  contraindicated  Pressure ulcer(s): Left foot wound, POA GI prophylaxis: N/A Lines: N/A Foley:  Yes, and it is still needed Code Status:  full code  Last date of multidisciplinary goals of care discussion 1/5 d/w pt wife at bedside. Case dw cards   CRITICAL CARE Performed by: Ronnald FORBES Gave   Total critical care time: 40 minutes  Critical care time was exclusive of separately billable procedures and treating other patients. Critical care was necessary to treat or prevent imminent or life-threatening deterioration.  Critical care was time spent personally by me on the following activities: development of treatment plan with patient and/or surrogate as well as nursing, discussions with consultants, evaluation of patient's response to treatment, examination of patient, obtaining history from  patient or surrogate, ordering and performing treatments and interventions, ordering and review of laboratory studies, ordering and review of radiographic studies, pulse oximetry and re-evaluation of patient's condition.  Ronnald Gave MSN, AGACNP-BC [facility] Amion for pager  12/10/2023, 11:04 AM

## 2023-12-10 NOTE — Progress Notes (Signed)
 Progress Note  Patient Name: Shane [name] Date of Encounter: 12/10/2023  Primary Cardiologist: Redell Shallow, MD   Subjective   Denies anginal chest pain. Denies heart failure symptoms. Getting 1 more unit of PRBCs. Currently on CRRT. Plan discussed with wife at bedside, nursing staff, intensivist  Inpatient Medications    Scheduled Meds:  sodium chloride    Intravenous Once   atorvastatin   80 mg Oral Daily   Chlorhexidine  Gluconate Cloth  6 each Topical Daily   dexamethasone  (DECADRON ) injection  6 mg Intravenous Q24H   docusate sodium   100 mg Oral BID   escitalopram   10 mg Oral Daily   feeding supplement (NEPRO CARB STEADY)  237 mL Oral BID BM   gabapentin   300 mg Oral BID   HYDROmorphone    Intravenous Q4H   insulin  aspart  0-20 Units Subcutaneous TID WC   insulin  aspart  0-5 Units Subcutaneous QHS   insulin  glargine-yfgn  10 Units Subcutaneous Daily   multivitamin  1 tablet Oral QHS   pantoprazole   40 mg Oral Daily   sodium zirconium cyclosilicate   10 g Oral TID   Continuous Infusions:  sodium chloride  10 mL/hr at 12/10/23 1100   prismasol  BGK 2/2.5 dialysis solution 1,500 mL/hr at 12/10/23 1107   prismasol  BGK 2/2.5 replacement solution 400 mL/hr at 12/10/23 0313   prismasol  BGK 2/2.5 replacement solution 400 mL/hr at 12/10/23 0315   PRN Meds: sodium chloride , acetaminophen , diphenhydrAMINE  **OR** diphenhydrAMINE , guaiFENesin , heparin , heparin  sodium (porcine), ipratropium-albuterol , melatonin, naloxone  **AND** sodium chloride  flush, ondansetron  (ZOFRAN ) IV, mouth rinse, phenol, polyethylene glycol, traZODone    Vital Signs    Vitals:   12/10/23 1030 12/10/23 1045 12/10/23 1100 12/10/23 1112  BP: 104/64 125/62 113/75 131/64  Pulse: 63 67 76 76  Resp: 10 (!) 9 11 14   Temp:    98 F (36.7 C)  TempSrc:    Oral  SpO2: 100% 100% 100% 100%  Weight:      Height:        Intake/Output Summary (Last 24 hours) at 12/10/2023 1114 Last data filed at 12/10/2023  1112 Gross per 24 hour  Intake 3824.72 ml  Output 632 ml  Net 3192.72 ml   Filed Weights   12/07/23 0344 12/08/23 0500 12/10/23 0500  Weight: 109.4 kg 108.6 kg 107.9 kg    Telemetry    Paced rhythm- Personally Reviewed  ECG    No new EKG since last rounding- Personally Reviewed  Physical Exam     General: Comfortable, resting in bed comfortably, hemodynamically stable Head: Atraumatic, normal size  Eyes: PEERLA, EOMI  Neck: Supple, normal JVD, central line present. Cardiac: Normal S1, S2; RRR; no murmurs, rubs, or gallops, sternotomy site is well-healed Lungs: Clear to auscultation bilaterally, nasal cannula oxygen Abd: Soft, nontender, no hepatomegaly  Ext: warm, no edema Musculoskeletal: Right BKA, left TMA, warm to touch bilaterally, no edema Skin: Warm and dry, no rashes   Neuro: Alert and oriented to person, place, time, and situation, CNII-XII grossly intact, no focal deficits  Psych: Normal mood and affect   Labs    Chemistry Recent Labs  Lab 12/06/23 1840 12/06/23 2020 12/09/23 0929 12/09/23 1606 12/10/23 0416  NA 130*   < > 132* 133* 134*  K 7.1*   < > 5.7* 4.8 4.4  CL 100   < > 97* 98 100  CO2 16*   < > 25 25 25   GLUCOSE 165*   < > 215* 175* 118*  BUN 116*   < >  62* 63* 43*  CREATININE 4.69*   < > 1.92* 1.86* 1.58*  CALCIUM  7.9*   < > 8.1* 8.0* 7.6*  PROT 6.8  --  5.8*  --  5.5*  ALBUMIN  2.9*   < > 2.6* 2.6* 2.5*  AST 45*  --  64*  --  53*  ALT 16  --  27  --  26  ALKPHOS 85  --  65  --  64  BILITOT 0.6  --  0.6  --  0.9  GFRNONAA 12*   < > 34* 36* 43*  ANIONGAP 14   < > 10 10 9    < > = values in this interval not displayed.     Hematology Recent Labs  Lab 12/09/23 0929 12/09/23 1606 12/09/23 2109 12/09/23 2354 12/10/23 0416 12/10/23 0904  WBC 12.0*  --  11.8*  --  12.3*  --   RBC 2.78*  --  2.96*  --  2.85*  --   HGB 7.6*   < > 8.0* 7.8* 7.8* 7.8*  HCT 23.4*   < > 24.5* 23.4* 23.7* 24.2*  MCV 84.2  --  82.8  --  83.2  --   MCH  27.3  --  27.0  --  27.4  --   MCHC 32.5  --  32.7  --  32.9  --   RDW 19.1*  --  18.6*  --  18.6*  --   PLT 188  --  174  --  168  --    < > = values in this interval not displayed.    Cardiac EnzymesNo results for input(s): TROPONINI in the last 168 hours. No results for input(s): TROPIPOC in the last 168 hours.   BNP Recent Labs  Lab 12/06/23 2310  BNP 480.6*     DDimer No results for input(s): DDIMER in the last 168 hours.   Radiology    CT ABDOMEN PELVIS W CONTRAST Result Date: 12/09/2023 CLINICAL DATA:  Right lower quadrant abdominal pain EXAM: CT ABDOMEN AND PELVIS WITH CONTRAST TECHNIQUE: Multidetector CT imaging of the abdomen and pelvis was performed using the standard protocol following bolus administration of intravenous contrast. RADIATION DOSE REDUCTION: This exam was performed according to the departmental dose-optimization program which includes automated exposure control, adjustment of the mA and/or kV according to patient size and/or use of iterative reconstruction technique. CONTRAST:  75mL OMNIPAQUE  IOHEXOL  350 MG/ML SOLN COMPARISON:  CT abdomen and pelvis 04/27/2018 FINDINGS: Lower chest: Peribronchovascular and peripheral ground-glass and reticular opacities compatible with interstitial lung disease. Small bilateral pleural effusions. Coronary artery atherosclerosis.  TAVR. Hepatobiliary: No acute abnormality. Pancreas: Unremarkable. Spleen: Unremarkable. Adrenals/Urinary Tract: Normal adrenal glands. No urinary calculi or hydronephrosis. Foley catheter and gas within the decompressed bladder. Stomach/Bowel: Normal caliber large and small bowel. No bowel wall thickening. Colonic diverticulosis without diverticulitis. Stomach and appendix are within normal limits. Vascular/Lymphatic: Right retroperitoneal hematoma measures 7.4 x 6.2 x 10.2 cm. Foci of hyperintensity on initial phase imaging (series 3/image 58-60) with diffusion on delayed phase images compatible with  active hemorrhage. Blood tracks inferiorly along the right psoas. Reproductive: No acute abnormality. Other: No free intraperitoneal air. Musculoskeletal: No acute fracture. Chronic bilateral L5 pars defects. IMPRESSION: 1. Right retroperitoneal hematoma with focus of active hemorrhage. 2. Chronic interstitial lung disease. Small bilateral pleural effusions. Superimposed edema or infection is difficult to exclude. These results were called by telephone at the time of interpretation on 12/09/2023 at 3:53 am to provider ADITYA PALIWAL , who  verbally acknowledged these results. Electronically Signed   By: Norman Gatlin M.D.   On: 12/09/2023 03:54    Cardiac Studies   Echo Care Everywhere 08/17/2023  MILD LV DYSFUNCTION (See above) WITH MILD LVH    MILD RV SYSTOLIC DYSFUNCTION (See above)    VALVULAR REGURGITATION: TRIVIAL MR, TRIVIAL PR, MILD TR    POOR SOUND TRANSMISSION DESPITE THE USE OF DEFINITY     LVEF APPEARS 45-50%    TAVR APPEARS WELL SEATED   Echo 12/07/2023  1. Left ventricular ejection fraction, by estimation, is 40%. The left ventricle has mild to moderately decreased function. The left ventricle demonstrates regional wall motion abnormalities with septal and inferior severe hypokinesis. Even with  Definity  contrast, images were poor. Left ventricular diastolic parameters  are indeterminate.   2. Peak RV-RA gradient 33 mmHg. IVC not visualized. Right ventricular  systolic function is moderately reduced. The right ventricular size is  normal.   3. Left atrial size was moderately dilated.   4. Right atrial size was mildly dilated.   5. The mitral valve is normal in structure. Mild to moderate mitral valve  regurgitation. No evidence of mitral stenosis.   6. Bioprosthetic aortic valve s/p TAVR with 29 mm Edwards Sapien THV.  Mean gradient 8 mmHg, EOA 1.57 cm^2 with dimensionless index 0.46. No  peri-valvular leakage noted. The aortic valve has been repaired/replaced.  Aortic valve  regurgitation is not  visualized.   7. Aortic dilatation noted. There is mild dilatation of the ascending  aorta, measuring 39 mm.   Patient Profile     83 y.o. 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  Assessment & Plan    NSTEMI-likely supply demand ischemia in the setting of COVID-19 infection, underlying cardiomyopathy CAD/CABG Troponins peaked at 8290 trending down  Denies anginal discomfort. EKG early on admission did not meet STEMI criteria. Echocardiogram noted mildly reduced LVEF with regional wall motion abnormalities. Shared decision was to treat him medically with IV heparin  for 48 hours and continue medical therapy. Stopped due to RP bleed (agree).  Remains free of angina pectoris. Reemphasized the importance of secondary prevention with focus on improving her modifiable cardiovascular risk factors. Monitor clinically  Acute retroperitoneal bleed CT abdomen pelvis with contrast December 09, 2023 Status post blood transfusion, platelet transfusion, protamine , DDAVP  Receiving another unit of PRBCs. Continue to monitor H&H. Once blood indices are stable recommend restarting antiplatelets given recent TAVR. Will hold off on anticoagulation for thromboembolic prophylaxis given his A-fib as the risk of bleeding is greater than the benefit at this time.  He will need to be evaluated for left atrial appendage closure device i.e. watchman as outpatient with his cardiology at [facility].  COVID-19 infection: Currently on nasal cannula oxygen.  Managed by CCM colleagues  Status post TAVR September 2024 at [facility] health: Based on records from Care Everywhere he had a TAVR in September 2024. Was on antiplatelet therapy post TAVR which now has to be held secondary  to spontaneous acute retroperitoneal bleed. Ideally would like to restart antiplatelets when clinically safe and cleared by IR and surgical team due to recent TAVR implant.  Will hold off on starting antiplatelets today as he is getting another unit of PRBCs.  Spoke to intensivist and likely consider initiation of antiplatelets tomorrow 12/11/2023.  Will  need outpatient follow-up with his cardiologist at [facility].  Coronary artery disease status post CABG: Denies anginal chest pain.  Antiplatelets held secondary to RP bleed. 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   Will uptitrate antianginal therapy and GDMT based on clinical trajectory.  Chronic HFpEF:  Stage C, NYHA class II.  Uptitration of GDMT difficult due to soft blood pressures and AKI requiring CRRT.  Atrial fibrillation:  On rate control strategy and Eliquis  for thromboembolic prophylaxis at home.  Eliquis  held secondary to acute spontaneous retroperitoneal bleed requiring blood products, protamine , and DDAVP . As discussed above at this time in the setting of acute spontaneous retroperitoneal bleed the risk of bleeding is greater than the benefit despite his CHA2DS2-VASc score.  I have explained this to the patient and wife in detail.  They should be more cognizant of strokelike symptoms and if present should come to the closest ER via EMS for further evaluation and management.  Recommend outpatient evaluation for possible watchman.  History of complete heart block, sick sinus syndrome, pacemaker implant: Telemetry notes paced rhythm.  Hyperlipidemia: Continue Lipitor  80 mg p.o. daily.  CRITICAL CARE Performed by: Madonna Large   Total critical care time: 35 minutes   Critical care time was exclusive of separately billable procedures and treating other patients.   Critical care was necessary to treat or prevent imminent or life-threatening deterioration.   Critical care was time spent personally by me on the following  activities: development of treatment plan with patient and/or surrogate as well as nursing, discussions with consultants, evaluation of patient's response to treatment, examination of patient, obtaining history from patient and wife, review of laboratory studies, ordering and review of radiographic studies, pulse oximetry and re-evaluation of patient's condition.  Madonna Large, DO, [facility]  [address] [place], [place] [address] Pager: [phone] Office: [phone] 12/10/23 11:14 AM

## 2023-12-10 NOTE — Progress Notes (Signed)
 Chart reviewed Labs reviewed     Latest Ref Rng & Units 12/10/2023    4:16 AM 12/09/2023   11:54 PM 12/09/2023    9:09 PM  CBC  WBC 4.0 - 10.5 K/uL 12.3   11.8   Hemoglobin 13.0 - 17.0 g/dL 7.8  7.8  8.0   Hematocrit 39.0 - 52.0 % 23.7  23.4  24.5   Platelets 150 - 400 K/uL 168   174    Appears hgb stable  If bleeds again - will need IR Bleed should tamponade given location  D/w CCM NP  Will sign off Pls call again if have questions  Data reviewed: labs x 48hrs, vitals x 36hrs, consultant notes - gi/ir/renal, CT, CCM notes Moderate MDM  Camellia HERO. Tanda, MD, FACS General, Bariatric, & Minimally Invasive Surgery [facility],  [facility]

## 2023-12-11 DIAGNOSIS — N179 Acute kidney failure, unspecified: Secondary | ICD-10-CM | POA: Diagnosis not present

## 2023-12-11 DIAGNOSIS — I480 Paroxysmal atrial fibrillation: Secondary | ICD-10-CM | POA: Diagnosis not present

## 2023-12-11 DIAGNOSIS — I739 Peripheral vascular disease, unspecified: Secondary | ICD-10-CM

## 2023-12-11 DIAGNOSIS — I214 Non-ST elevation (NSTEMI) myocardial infarction: Secondary | ICD-10-CM | POA: Diagnosis not present

## 2023-12-11 DIAGNOSIS — R58 Hemorrhage, not elsewhere classified: Secondary | ICD-10-CM | POA: Diagnosis not present

## 2023-12-11 DIAGNOSIS — R1319 Other dysphagia: Secondary | ICD-10-CM

## 2023-12-11 DIAGNOSIS — R1310 Dysphagia, unspecified: Secondary | ICD-10-CM

## 2023-12-11 DIAGNOSIS — U071 COVID-19: Secondary | ICD-10-CM

## 2023-12-11 LAB — RENAL FUNCTION PANEL
Albumin: 2.5 g/dL — ABNORMAL LOW (ref 3.5–5.0)
Anion gap: 9 (ref 5–15)
BUN: 32 mg/dL — ABNORMAL HIGH (ref 8–23)
CO2: 25 mmol/L (ref 22–32)
Calcium: 7.6 mg/dL — ABNORMAL LOW (ref 8.9–10.3)
Chloride: 98 mmol/L (ref 98–111)
Creatinine, Ser: 1.64 mg/dL — ABNORMAL HIGH (ref 0.61–1.24)
GFR, Estimated: 42 mL/min — ABNORMAL LOW (ref >60)
Glucose, Bld: 149 mg/dL — ABNORMAL HIGH (ref 70–99)
Phosphorus: 3.9 mg/dL (ref 2.5–4.6)
Potassium: 4.1 mmol/L (ref 3.5–5.1)
Sodium: 132 mmol/L — ABNORMAL LOW (ref 135–145)

## 2023-12-11 LAB — CBC
HCT: 24.4 % — ABNORMAL LOW (ref 39.0–52.0)
HCT: 22.9 % — ABNORMAL LOW (ref 39.0–52.0)
Hemoglobin: 8 g/dL — ABNORMAL LOW (ref 13.0–17.0)
Hemoglobin: 7.5 g/dL — ABNORMAL LOW (ref 13.0–17.0)
MCH: 28 pg (ref 26.0–34.0)
MCH: 28 pg (ref 26.0–34.0)
MCHC: 32.8 g/dL (ref 30.0–36.0)
MCHC: 32.8 g/dL (ref 30.0–36.0)
MCV: 85.3 fL (ref 80.0–100.0)
MCV: 85.4 fL (ref 80.0–100.0)
Platelets: 146 10*3/uL — ABNORMAL LOW (ref 150–400)
Platelets: 157 10*3/uL (ref 150–400)
RBC: 2.86 MIL/uL — ABNORMAL LOW (ref 4.22–5.81)
RBC: 2.68 MIL/uL — ABNORMAL LOW (ref 4.22–5.81)
RDW: 17.6 % — ABNORMAL HIGH (ref 11.5–15.5)
RDW: 17.2 % — ABNORMAL HIGH (ref 11.5–15.5)
WBC: 16.6 10*3/uL — ABNORMAL HIGH (ref 4.0–10.5)
WBC: 17.8 10*3/uL — ABNORMAL HIGH (ref 4.0–10.5)
nRBC: 0.5 % — ABNORMAL HIGH (ref 0.0–0.2)
nRBC: 0.5 % — ABNORMAL HIGH (ref 0.0–0.2)

## 2023-12-11 LAB — COPPER, SERUM: Copper: 108 ug/dL (ref 69–132)

## 2023-12-11 LAB — CULTURE, BLOOD (ROUTINE X 2)
Culture: NO GROWTH
Culture: NO GROWTH

## 2023-12-11 LAB — ZINC: Zinc: 41 ug/dL — ABNORMAL LOW (ref 44–115)

## 2023-12-11 LAB — HEMOGLOBIN AND HEMATOCRIT, BLOOD
HCT: 25 % — ABNORMAL LOW (ref 39.0–52.0)
Hemoglobin: 8 g/dL — ABNORMAL LOW (ref 13.0–17.0)

## 2023-12-11 LAB — GLUCOSE, CAPILLARY
Glucose-Capillary: 106 mg/dL — ABNORMAL HIGH (ref 70–99)
Glucose-Capillary: 112 mg/dL — ABNORMAL HIGH (ref 70–99)
Glucose-Capillary: 149 mg/dL — ABNORMAL HIGH (ref 70–99)
Glucose-Capillary: 241 mg/dL — ABNORMAL HIGH (ref 70–99)
Glucose-Capillary: 247 mg/dL — ABNORMAL HIGH (ref 70–99)

## 2023-12-11 LAB — VITAMIN A: Vitamin A (Retinoic Acid): 38.9 ug/dL (ref 22.0–69.5)

## 2023-12-11 LAB — MAGNESIUM: Magnesium: 2.5 mg/dL — ABNORMAL HIGH (ref 1.7–2.4)

## 2023-12-11 MED ORDER — HEPARIN SODIUM (PORCINE) 1000 UNIT/ML IJ SOLN
INTRAMUSCULAR | Status: AC
  Administered 2023-12-11: 2400 [IU] via INTRAVENOUS_CENTRAL
  Filled 2023-12-11: qty 3

## 2023-12-11 MED ORDER — ASPIRIN 81 MG PO CHEW
81.0000 mg | CHEWABLE_TABLET | Freq: Once | ORAL | Status: AC
  Administered 2023-12-11: 81 mg via ORAL
  Filled 2023-12-11: qty 1

## 2023-12-11 MED ORDER — OXYCODONE HCL 5 MG PO TABS
5.0000 mg | ORAL_TABLET | ORAL | Status: DC | PRN
  Administered 2023-12-19 – 2023-12-23 (×19): 5 mg via ORAL
  Filled 2023-12-11 (×19): qty 1

## 2023-12-11 MED ORDER — DEXAMETHASONE 4 MG PO TABS
6.0000 mg | ORAL_TABLET | Freq: Every day | ORAL | Status: AC
  Administered 2023-12-11 – 2023-12-15 (×5): 6 mg via ORAL
  Filled 2023-12-11 (×5): qty 2

## 2023-12-11 MED ORDER — FUROSEMIDE 10 MG/ML IJ SOLN
80.0000 mg | Freq: Once | INTRAMUSCULAR | Status: AC
  Administered 2023-12-11: 80 mg via INTRAVENOUS
  Filled 2023-12-11: qty 8

## 2023-12-11 MED ORDER — HYDROMORPHONE HCL 1 MG/ML IJ SOLN
1.0000 mg | INTRAMUSCULAR | Status: DC | PRN
  Administered 2023-12-11 – 2023-12-28 (×25): 1 mg via INTRAVENOUS
  Filled 2023-12-11 (×25): qty 1

## 2023-12-11 MED ORDER — GABAPENTIN 300 MG PO CAPS
300.0000 mg | ORAL_CAPSULE | Freq: Every day | ORAL | Status: DC
  Administered 2023-12-12 – 2023-12-29 (×18): 300 mg via ORAL
  Filled 2023-12-11 (×18): qty 1

## 2023-12-11 MED ORDER — SODIUM ZIRCONIUM CYCLOSILICATE 10 G PO PACK
10.0000 g | PACK | Freq: Every day | ORAL | Status: DC
  Administered 2023-12-11: 10 g via ORAL
  Filled 2023-12-11: qty 1

## 2023-12-11 MED ORDER — ENSURE ENLIVE PO LIQD
237.0000 mL | Freq: Two times a day (BID) | ORAL | Status: DC
  Administered 2023-12-11: 237 mL via ORAL

## 2023-12-11 NOTE — Progress Notes (Signed)
 Nutrition Follow-up  DOCUMENTATION CODES:   Not applicable  INTERVENTION:  Encourage adequate PO intake.  Protein containing snacks TID between meals to promote smaller, more frequent PO intake Ensure Enlive po BID, each supplement provides 350 kcal and 20 grams of protein. Magic cup TID with meals, each supplement provides 290 kcal and 9 grams of protein Renal MVI with minerals daily Micronutrient labs pending: Vitamin A , Vitamin C , zinc , copper , CRP  NUTRITION DIAGNOSIS:   Inadequate oral intake related to chronic illness (CAD, AS, CKD, DM) as evidenced by per patient/family report. - remains applicable  GOAL:   Patient will meet greater than or equal to 90% of their needs - goal unmet  MONITOR:   PO intake, Supplement acceptance, Labs, Weight trends, Diet advancement, I & O's, Skin  REASON FOR ASSESSMENT:   Consult Assessment of nutrition requirement/status (CRRT protocol)  ASSESSMENT:   Pt admitted with AKI and hypotension, found to be COVID+ on admission. 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  01/01: admitted 01/02: CRRT initiated 01/03: CRRT held 01/04: s/p CT abd/pelvis- large retroperitoneal hematoma; CRRT restarted  01/06: CRRT stopped  Pt is s/p bed side swallow evaluation with SLP. No s/s of oropharyngeal dysphagia though had immediate regurgitation of graham crackers. Continue regular diet, thin liquids, with recommendation of esophageal assessment. GI consulted; pt high risk for EGD with dilations d/t acute retroperitoneal bleed. Plans for possible work up later in admission vs outpatient.   Spoke with pt and his wife at bedside. Pt had been NPO during the weekend d/t abdominal pain and retroperitoneal bleed. Diet advanced this morning. He endorses feeling hungry. Consumed 1 Nepro shake today. He prefers chocolate flavored shakes. Will  transition back to Ensure supplements to provide preferred flavor.   Discussed with pt if PO intake remains limited, may consider enteral nutrition support via Cortrak placement. Will try to optimize nutritional intake with supplements and snacks at this time.   Pt endorses ongoing abdominal pain managed well with medications. Denies n/v/diarrhea.   Admit weight: 102 kg Current weight: 108.4 kg  Medications: decadron , colace BID, SSI 0-20 units TID, SSI 0-5 units at bedtime, semglee  10 units daily, rena-vit, protonix , lokelma   Labs: sodium 132, BUN 32, Cr 1.64, Mg 2.5, GFR 42, CBG's 100-112 x24 hours  CRRT: x24 hours  Diet Order:   Diet Order             Diet Carb Modified Fluid consistency: Thin  Diet effective now                   EDUCATION NEEDS:   Education needs have been addressed  Skin:  Skin Assessment: Skin Integrity Issues: Skin Integrity Issues:: Other (Comment) Other: L foot non-pressure wound 2.5cm x 1.5 cm  Last BM:  1/3  Height:   Ht Readings from Last 1 Encounters:  12/06/23 6' (1.829 m)    Weight:   Wt Readings from Last 1 Encounters:  12/11/23 108.4 kg    Ideal Body Weight:  80.9 kg  BMI:  Body mass index is 32.41 kg/m.  Estimated Nutritional Needs:   Kcal:  2000-2200  Protein:  130-145g  Fluid:  1L + [name]  Royce Maris, RDN, LDN Clinical Nutrition

## 2023-12-11 NOTE — Progress Notes (Addendum)
 Progress Note  Primary GI: Dr. Abran DOA: 12/06/2023         Hospital Day: 6   Subjective  Chief Complaint: Dysphagia  Patient with family at bedside, wife. Provided some of the history.  Patient states he is doing better, continues to have some mild shortness of breath but overall doing well. Patient states she has had trouble swallowing for over 3 years since his bypass.  Primarily has issues with solids will have regurgitation of solids and liquids occasionally, no longer able to eat bread.  But he changed his diet with eating softer foods drinking a lot of liquid after eating and eating slower.  Patient denies any weight loss associated no melena or GERD.    Objective   Vital signs in last 24 hours: Temp:  [97.6 F (36.4 C)-98 F (36.7 C)] 97.7 F (36.5 C) (01/06 1129) Pulse Rate:  [60-96] 82 (01/06 0900) Resp:  [6-27] 14 (01/06 1000) BP: (79-152)/(44-113) 115/54 (01/06 1000) SpO2:  [73 %-100 %] 98 % (01/06 0900) FiO2 (%):  [100 %] 100 % (01/06 0832) Weight:  [108.4 kg] 108.4 kg (01/06 0500) Last BM Date : 12/08/23 Last BM recorded by nurses in past 5 days Stool Type: Type 5 (Soft blobs with clear-cut edges) (12/08/2023  1:14 PM)  General:   male in no acute distress  Heart:  Regular rate and rhythm; no murmurs, sternotomy site is well-healed  Pulm: Clear anteriorly; no wheezing, nasal cannula oxygen versus BiPAP Abdomen:  Soft, Obese AB, Active bowel sounds. No tenderness, No organomegaly appreciated. Extremities:  without  edema, bilateral ecchymoses, right BKA Neurologic:  Alert and  oriented x4;  No focal deficits.  Psych:  Cooperative. Normal mood and affect.  Intake/Output from previous day: 01/05 0701 - 01/06 0700 In: 1126.8 [P.O.:476; I.V.:300; Blood:350.8] Out: 942 [Urine:49] Intake/Output this shift: Total I/O In: 140 [P.O.:120; I.V.:20] Out: 55 [Urine:25]  Studies/Results: No results found.  Lab Results: Recent Labs    12/09/23 2109 12/09/23 2354  12/10/23 0416 12/10/23 0904 12/10/23 2017 12/11/23 0038 12/11/23 0307  WBC 11.8*  --  12.3*  --   --   --  16.6*  HGB 8.0*   < > 7.8*   < > 8.3* 8.0* 8.0*  HCT 24.5*   < > 23.7*   < > 25.5* 25.0* 24.4*  PLT 174  --  168  --   --   --  146*   < > = values in this interval not displayed.   BMET Recent Labs    12/10/23 0416 12/10/23 1607 12/11/23 0307  NA 134* 132* 132*  K 4.4 4.2 4.1  CL 100 97* 98  CO2 25 25 25   GLUCOSE 118* 126* 149*  BUN 43* 37* 32*  CREATININE 1.58* 1.65* 1.64*  CALCIUM  7.6* 7.7* 7.6*   LFT Recent Labs    12/10/23 0416 12/10/23 1607 12/11/23 0307  PROT 5.5*  --   --   ALBUMIN  2.5*   < > 2.5*  AST 53*  --   --   ALT 26  --   --   ALKPHOS 64  --   --   BILITOT 0.9  --   --    < > = values in this interval not displayed.   PT/INR Recent Labs    12/09/23 0929  LABPROT 18.9*  INR 1.6*     Scheduled Meds:  aspirin   81 mg Oral Once   atorvastatin   80 mg Oral Daily  Chlorhexidine  Gluconate Cloth  6 each Topical Daily   dexamethasone   6 mg Oral QHS   docusate sodium   100 mg Oral BID   escitalopram   10 mg Oral Daily   feeding supplement (NEPRO CARB STEADY)  237 mL Oral BID BM   [START ON 12/12/2023] gabapentin   300 mg Oral Daily   insulin  aspart  0-20 Units Subcutaneous TID WC   insulin  aspart  0-5 Units Subcutaneous QHS   insulin  glargine-yfgn  10 Units Subcutaneous Daily   multivitamin  1 tablet Oral QHS   pantoprazole   40 mg Oral Daily   sodium zirconium cyclosilicate   10 g Oral Daily   Continuous Infusions:  sodium chloride  10 mL/hr at 12/11/23 0900      Patient profile:   83 year old male with history of CAD status post bypass, SSS status post pacemaker, OSA, severe aortic stenosis status post TAVR, HFpEF, presents the hospital with non-STEMI thought to be secondary to COVID 19 and heart failure exacerbation.  GI consulted secondary to history of dysphagia.  Complicated by retroperitoneal bleed.   Impression/Plan:    Dysphagia Has been chronic over the last 3 years At some point patient does need evaluation with upper endoscopy and dilation with history of regurgitation aggressive nature with solids however at this point patient has no associated melena, weight loss and states it is well-controlled with diet changes Discussed with patient and his wife and they would prefer not to have endoscopic evaluation done at this time due to recent CHF exacerbation, COVID and retroperitoneal bleed. Will suggest barium swallow inpatient or outpatient Outpatient follow-up with her office for evaluation for EGD with dilation in hospital setting outpatient or if patient changes his mind can pursue this admission, please call us  back. Continue dysphagia diet/soft foods, chew foods well, sit upright, etc  Comorbid conditions: AKI currently on CRRT which is paused to evaluate need for HD in future Pulmonary fibrosis with chronic hypoxia COVID-19 CAD status post bypass HFpEF OSA on CPAP Acute retroperitoneal bleed currently stable   Principal Problem:   Acute kidney injury (nontraumatic) (HCC) Active Problems:   COVID-19 virus infection   PAD (peripheral artery disease) (HCC)   Atrial fibrillation (HCC)   Hyperkalemia   Acute on chronic congestive heart failure (HCC)   Non-ST elevation (NSTEMI) myocardial infarction [facility])   Retroperitoneal bleeding   History of transcatheter aortic valve replacement (TAVR)   Chronic heart failure with preserved ejection fraction (HFpEF) (HCC)   Hx of CABG   Atherosclerosis of native coronary artery of native heart without angina pectoris   Acute on chronic hypoxic respiratory failure (HCC)   Anemia    LOS: 5 days   Alan JONELLE Coombs  12/11/2023, 12:03 PM   Attending physician's note   I have taken a history, reviewed the chart and examined the patient. I performed a substantive portion of this encounter, including complete performance of at least one of the key  components, in conjunction with the APP. I agree with the APP's note, impression and recommendations.   Intermittent dysphagia to mostly solids, dense bread and meat occasionally to liquids. Hospitalized with COVID-19 and exacerbation of heart failure.  He will benefit from EGD for evaluation and possible esophageal dilation based on his symptoms Patient would like to defer any procedures at this point   Consider obtaining barium esophagram prior to discharge to exclude any high risk lesions.  Will arrange for outpatient follow-up and EGD as outpatient.  Continue soft diet, avoid tough meat or dense  bread Antireflux measures  GI is available if needed, please call with any questions  The patient was provided an opportunity to ask questions and all were answered. The patient agreed with the plan and demonstrated an understanding of the instructions.   LOIS Wilkie Mcgee , MD [phone]

## 2023-12-11 NOTE — Progress Notes (Signed)
 NAME:  Shane [name], MRN:  [id number], DOB:  August 18, 1941, LOS: 5 ADMISSION DATE:  12/06/2023, CONSULTATION DATE:  12/06/2023 REFERRING MD:  Ula, EDP, CHIEF COMPLAINT:  aki, hypotension  History of Present Illness:  83 year old male with significant past medical history including obstructive sleep apnea on CPAP at night, CAD s/p CABG x3 in 2021, aortic stenosis s/p [facility]R 08/2023 at [facility], HFpEF, atrial fibrillation on Eliquis , plavix , sick sinus syndrome s/p leadless PPM, hypertension, hyperlipidemia, GERD, uncontrolled diabetes, CKD 3, pulmonary fibrosis followed by Dr. Geronimo, who presented to the emergency department on 12/06/2023 with shortness of breath.  Per the ED provider complaining of cough and shortness of breath over the past 3 days.  Using his nebulizers at home with no improvement in his symptoms.  EMS was called and found to be hypotensive and hypoxic.  He was started on nonrebreather and an epinephrine  infusion prior to arrival.  In the ED afebrile, SBP around 100, tachypneic.  Labs +COVID, AKI on CKD with BUN/Cr 116, 4.69 with critical K of 7.1. bicarb 16. CBC without leukocytosis, hgb 9.5. lactic 2.3>1.9, troponin 8,290, UA negative, blood cultures pending. CXR with likely pulmonary edema. With new AKI, hyperkalemia and EKG changes, nephrology was consulted. Patient given lokelma , albuterol , Lasix , sodium bicarb, 5U insulin  with D50, calcium  gluconate. Was also given azithromycin /rocephin  CAP coverage. Neprhology recommended albumin  for BP support (initially on 2mcg levo, now off), Lasix  80mg  IV, renal ultrasound. Pending repeat BMP on consult for admission to ICU.  On my exam, laying in bed, mildly tachypneic on albuterol  treatment. Alert and oriented. Endorses the above story. He does state he had diarrhea over the preceding 2 days and intermittent nausea without vomiting. Decreased appetite. He does feel volume overloaded from his baseline. States his abdomen feels tighter. Wife is at  bedside, endorses story.   Pertinent  Medical History  obstructive sleep apnea on CPAP at night, CAD s/p CABG x3 in 2021, aortic stenosis s/p TAVR 08/2023 at [facility], HFpEF, atrial fibrillation on Eliquis , plavix , sick sinus syndrome s/p leadless PPM, hypertension, hyperlipidemia, GERD, uncontrolled diabetes, CKD 3, pulmonary fibrosis  Significant Hospital Events: Including procedures, antibiotic start and stop dates in addition to other pertinent events   1/1: Admit to CCM for covid pneumonia, aki with hyperK likely needing CRRT  1/2: CRRT initiated, Card Consult due to elev Troponin (Nstemi vs Demand Ischemia)  1/3: Continuing CRRT till this afternoon, PCCM sign off this afternoon, trx to [facility] tomorrow  1/4 RPB -- multipl PRBC. CCS and Ir consults. Medical mgmnt.  1/5 1 PRBC  Interim History / Subjective:  No events, pain under control, cough improving.  Objective   Blood pressure (!) 141/57, pulse 61, temperature 97.7 F (36.5 C), temperature source Oral, resp. rate 10, height 6' (1.829 m), weight 108.4 kg, SpO2 100%.    FiO2 (%):  [100 %] 100 %   Intake/Output Summary (Last 24 hours) at 12/11/2023 9191 Last data filed at 12/11/2023 0700 Gross per 24 hour  Intake 1116.82 ml  Output 924 ml  Net 192.82 ml   Filed Weights   12/08/23 0500 12/10/23 0500 12/11/23 0500  Weight: 108.6 kg 107.9 kg 108.4 kg    Examination: No distress Lungs sounds okay Moves to command Paced rhythm on monitor Aox3 CRRT ongoing Abd soft AKA and TMA noted  H/H stable WBC up slightly BMP okay  Resolved Hospital Problem list    Assessment & Plan:   Acute on chronic hypoxic respiratory failure, multifactorial Pulmonary edema 2/2  HF exacerbation   COVID -19 infection-s/p remdes.  OSA  Pulmonary fibrosis  ABLA 2/2 RPB superimposed on chronic anemia  AoC HFpEF, BiV dysfunction   Elevated trops  MV Regurg  CAD s/p CABG  Afib on eliquis  SSS s/p leadless ppm AS s/p TAVR  HTN HLD  AKI on  CKD -  now needs HD Hyperkalemia  NAGMA  DM2 with hyperglycemia  Dysphagia   Bleeding has eased up in contained RP space, discussed with cards: start asa, defer AC resumption to OP eval Baseline O2 needs are 2LPM, continue to wean (4LPM presently) CRRT can stop, BP is fine, to see if any renal recovery otherwise iHD Switch PCA to PRN opiates for RP pain Re-engage GI for consideration of EGD and dilation Stable for transfer to progressive, remaining issues: H/H stability, GI eval, PT/OT, pain control  Rolan Sharps MD PCCM 12/11/2023, 8:08 AM

## 2023-12-11 NOTE — Progress Notes (Signed)
 [facility] Progress Note   Assessment/ Plan:   1.  Acute kidney injury: Appears to be hemodynamically mediated possibly from a combination of CHF exacerbation +/- sepsis with evolution to ATN.   - Crrt 1/2-1/3.  Then CRRT was paused but restarted 12/09/23 in setting of RP bleed - Pause CRRT and assess dialysis needs daily   - Close attention to K - would reduce gabapentin  to 300 mg daily max given his AKI  2.  Hyperkalemia:  - improved with lokelma  - stopping CRRT   - reduce lasix  to daily from TID dosing  - diet education provided to pt and wife  3.  Anion gap metabolic acidosis:  - has been on CRRT   4.  Shock - improved: Appears to be largely cardiogenic but cannot rule out septic component with presentation symptoms suggestive of LRTI.  He was started on antibiotic coverage for community-acquired pneumonia. - abx per primary team   5. Acute HF with reduced EF - optimize volume status with RRT  6. Covid 19 PNA - Therapies primary team  - optimizing volume status as above   7.  Acute hypoxic respiratory failure: Appears to be from a combination of CHF exacerbation +/- underlying interstitial lung disease and now LRTI/community-acquired pneumonia.  On oxygen supplementation and antimicrobial coverage.  8.  Normocytic Anemia: with RP bleed and critical illness.  PRBC's per primary team.  Note pt with NSTEMI - supply/demand ischemia in setting of covid with underlying cardiomyopathy, CAD per cardiology.  Defer ESA for now  9. RP Bleed:  - per PCCM  - PRBC's per primary team     Subjective:    Seen and examined on CRRT.  He is to be transferred to the floor once off of CRRT.  He had 49 mL uop over 1/5 charted.  He had 893 mL UF over 1/5 with CRRT.  States his breathing is fine.  He normally wears oxygen at night at home.   Review of systems:  He denies shortness of breath  Denies chest pain Nausea yesterday and none today; no emesis    Objective:   BP (!)  141/57   Pulse 61   Temp 97.7 F (36.5 C) (Oral)   Resp 10   Ht 6' (1.829 m)   Wt 108.4 kg   SpO2 100%   BMI 32.41 kg/m   Intake/Output Summary (Last 24 hours) at 12/11/2023 0831 Last data filed at 12/11/2023 0700 Gross per 24 hour  Intake 1116.82 ml  Output 924 ml  Net 192.82 ml   Weight change: 0.5 kg  Physical Exam:  General elderly male in bed in no acute distress HEENT normocephalic atraumatic extraocular movements intact sclera anicteric Neck supple trachea midline Lungs clear to auscultation bilaterally normal work of breathing at rest on 4 liters Heart S1S2 no rub Abdomen soft nontender nondistended Extremities 1+ edema lower extremities; right BKA Psych normal mood and affect Neuro alert and oriented x 3 provides hx and follows commands Access: RIJ non-tunneled HD catheter  Imaging: No results found.   Labs: BMET Recent Labs  Lab 12/08/23 0355 12/08/23 1609 12/09/23 0348 12/09/23 0929 12/09/23 1606 12/10/23 0416 12/10/23 1607 12/11/23 0307  NA 134* 135 133* 132* 133* 134* 132* 132*  K 5.2* 4.3 4.9 5.7* 4.8 4.4 4.2 4.1  CL 97* 99 99 97* 98 100 97* 98  CO2 24 26 25 25 25 25 25 25   GLUCOSE 170* 113* 186* 215* 175* 118* 126* 149*  BUN  53* 41* 56* 62* 63* 43* 37* 32*  CREATININE 1.66* 1.42* 1.61* 1.92* 1.86* 1.58* 1.65* 1.64*  CALCIUM  8.5* 8.5* 8.2* 8.1* 8.0* 7.6* 7.7* 7.6*  PHOS 3.8 3.5 4.7*  --  6.0* 4.5 4.8* 3.9   CBC Recent Labs  Lab 12/06/23 1840 12/06/23 2311 12/09/23 0929 12/09/23 1606 12/09/23 2109 12/09/23 2354 12/10/23 0416 12/10/23 0904 12/10/23 1607 12/10/23 2017 12/11/23 0038 12/11/23 0307  WBC 8.8   < > 12.0*  --  11.8*  --  12.3*  --   --   --   --  16.6*  NEUTROABS 6.7  --   --   --   --   --   --   --   --   --   --   --   HGB 9.5*   < > 7.6*   < > 8.0*   < > 7.8*   < > 8.7* 8.3* 8.0* 8.0*  HCT 30.1*   < > 23.4*   < > 24.5*   < > 23.7*   < > 26.9* 25.5* 25.0* 24.4*  MCV 86.2   < > 84.2  --  82.8  --  83.2  --   --   --   --   85.3  PLT 192   < > 188  --  174  --  168  --   --   --   --  146*   < > = values in this interval not displayed.    Medications:     atorvastatin   80 mg Oral Daily   Chlorhexidine  Gluconate Cloth  6 each Topical Daily   dexamethasone  (DECADRON ) injection  6 mg Intravenous Q24H   docusate sodium   100 mg Oral BID   escitalopram   10 mg Oral Daily   feeding supplement (NEPRO CARB STEADY)  237 mL Oral BID BM   gabapentin   300 mg Oral BID   HYDROmorphone    Intravenous Q4H   insulin  aspart  0-20 Units Subcutaneous TID WC   insulin  aspart  0-5 Units Subcutaneous QHS   insulin  glargine-yfgn  10 Units Subcutaneous Daily   multivitamin  1 tablet Oral QHS   pantoprazole   40 mg Oral Daily   sodium zirconium cyclosilicate   10 g Oral TID    Katheryn JAYSON Saba, MD 12/11/2023, 8:55 AM

## 2023-12-11 NOTE — Progress Notes (Signed)
 Progress Note  Patient Name: Shane [name] Date of Encounter: 12/11/2023  Primary Cardiologist: Redell Shallow, MD   Subjective   Denies anginal chest pain. Denies heart failure symptoms. Currently on CRRT Wife at bedside.  Inpatient Medications    Scheduled Meds:  atorvastatin   80 mg Oral Daily   Chlorhexidine  Gluconate Cloth  6 each Topical Daily   dexamethasone  (DECADRON ) injection  6 mg Intravenous Q24H   docusate sodium   100 mg Oral BID   escitalopram   10 mg Oral Daily   feeding supplement (NEPRO CARB STEADY)  237 mL Oral BID BM   [START ON 12/12/2023] gabapentin   300 mg Oral Daily   insulin  aspart  0-20 Units Subcutaneous TID WC   insulin  aspart  0-5 Units Subcutaneous QHS   insulin  glargine-yfgn  10 Units Subcutaneous Daily   multivitamin  1 tablet Oral QHS   pantoprazole   40 mg Oral Daily   sodium zirconium cyclosilicate   10 g Oral Daily   Continuous Infusions:  sodium chloride  10 mL/hr at 12/11/23 0900   PRN Meds: sodium chloride , acetaminophen , guaiFENesin , heparin  sodium (porcine), HYDROmorphone  (DILAUDID ) injection, ipratropium-albuterol , melatonin, ondansetron  (ZOFRAN ) IV, mouth rinse, oxyCODONE , phenol, polyethylene glycol, traZODone    Vital Signs    Vitals:   12/11/23 0800 12/11/23 0832 12/11/23 0900 12/11/23 1000  BP: (!) 124/57 (!) 126/57 131/63 (!) 115/54  Pulse:   82   Resp: 14 18 11 14   Temp:      TempSrc:      SpO2:  100% 98%   Weight:      Height:        Intake/Output Summary (Last 24 hours) at 12/11/2023 1023 Last data filed at 12/11/2023 1000 Gross per 24 hour  Intake 966.82 ml  Output 955 ml  Net 11.82 ml   Filed Weights   12/08/23 0500 12/10/23 0500 12/11/23 0500  Weight: 108.6 kg 107.9 kg 108.4 kg    Telemetry    Paced rhythm- Personally Reviewed  ECG    No new EKG since last rounding- Personally Reviewed  Physical Exam     General: Comfortable, resting in bed comfortably, hemodynamically stable Head: Atraumatic,  normal size  Eyes: PEERLA, EOMI  Neck: Supple, normal JVD, central line present. Cardiac: Normal S1, S2; RRR; no murmurs, rubs, or gallops, sternotomy site is well-healed Lungs: Clear to auscultation bilaterally, nasal cannula oxygen Abd: Soft, nontender, no hepatomegaly  Ext: warm, no edema Musculoskeletal: Right BKA, left TMA, warm to touch bilaterally, no edema Skin: Warm and dry, no rashes   Neuro: Alert and oriented to person, place, time, and situation, CNII-XII grossly intact, no focal deficits  Psych: Normal mood and affect   Labs    Chemistry Recent Labs  Lab 12/06/23 1840 12/06/23 2020 12/09/23 0929 12/09/23 1606 12/10/23 0416 12/10/23 1607 12/11/23 0307  NA 130*   < > 132*   < > 134* 132* 132*  K 7.1*   < > 5.7*   < > 4.4 4.2 4.1  CL 100   < > 97*   < > 100 97* 98  CO2 16*   < > 25   < > 25 25 25   GLUCOSE 165*   < > 215*   < > 118* 126* 149*  BUN 116*   < > 62*   < > 43* 37* 32*  CREATININE 4.69*   < > 1.92*   < > 1.58* 1.65* 1.64*  CALCIUM  7.9*   < > 8.1*   < > 7.6*  7.7* 7.6*  PROT 6.8  --  5.8*  --  5.5*  --   --   ALBUMIN  2.9*   < > 2.6*   < > 2.5* 2.6* 2.5*  AST 45*  --  64*  --  53*  --   --   ALT 16  --  27  --  26  --   --   ALKPHOS 85  --  65  --  64  --   --   BILITOT 0.6  --  0.6  --  0.9  --   --   GFRNONAA 12*   < > 34*   < > 43* 41* 42*  ANIONGAP 14   < > 10   < > 9 10 9    < > = values in this interval not displayed.     Hematology Recent Labs  Lab 12/09/23 2109 12/09/23 2354 12/10/23 0416 12/10/23 0904 12/10/23 2017 12/11/23 0038 12/11/23 0307  WBC 11.8*  --  12.3*  --   --   --  16.6*  RBC 2.96*  --  2.85*  --   --   --  2.86*  HGB 8.0*   < > 7.8*   < > 8.3* 8.0* 8.0*  HCT 24.5*   < > 23.7*   < > 25.5* 25.0* 24.4*  MCV 82.8  --  83.2  --   --   --  85.3  MCH 27.0  --  27.4  --   --   --  28.0  MCHC 32.7  --  32.9  --   --   --  32.8  RDW 18.6*  --  18.6*  --   --   --  17.6*  PLT 174  --  168  --   --   --  146*   < > = values in  this interval not displayed.    Cardiac EnzymesNo results for input(s): TROPONINI in the last 168 hours. No results for input(s): TROPIPOC in the last 168 hours.   BNP Recent Labs  Lab 12/06/23 2310  BNP 480.6*     DDimer No results for input(s): DDIMER in the last 168 hours.   Radiology    No results found.   Cardiac Studies   Echo Care Everywhere 08/17/2023  MILD LV DYSFUNCTION (See above) WITH MILD LVH    MILD RV SYSTOLIC DYSFUNCTION (See above)    VALVULAR REGURGITATION: TRIVIAL MR, TRIVIAL PR, MILD TR    POOR SOUND TRANSMISSION DESPITE THE USE OF DEFINITY     LVEF APPEARS 45-50%    TAVR APPEARS WELL SEATED   Echo 12/07/2023  1. Left ventricular ejection fraction, by estimation, is 40%. The left ventricle has mild to moderately decreased function. The left ventricle demonstrates regional wall motion abnormalities with septal and inferior severe hypokinesis. Even with  Definity  contrast, images were poor. Left ventricular diastolic parameters  are indeterminate.   2. Peak RV-RA gradient 33 mmHg. IVC not visualized. Right ventricular  systolic function is moderately reduced. The right ventricular size is  normal.   3. Left atrial size was moderately dilated.   4. Right atrial size was mildly dilated.   5. The mitral valve is normal in structure. Mild to moderate mitral valve  regurgitation. No evidence of mitral stenosis.   6. Bioprosthetic aortic valve s/p TAVR with 29 mm Edwards Sapien THV.  Mean gradient 8 mmHg, EOA 1.57 cm^2 with dimensionless index 0.46. No  peri-valvular leakage noted. The  aortic valve has been repaired/replaced.  Aortic valve regurgitation is not  visualized.   7. Aortic dilatation noted. There is mild dilatation of the ascending  aorta, measuring 39 mm.   Patient Profile     83 y.o. 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  Assessment & Plan    NSTEMI-likely supply demand ischemia in the setting of COVID-19 infection, underlying cardiomyopathy CAD/CABG Troponins peaked at 8290 and trending down  Denies anginal discomfort. EKG early on admission did not meet STEMI criteria. Echocardiogram noted mildly reduced LVEF with regional wall motion abnormalities. Shared decision was to treat him medically with IV heparin  for 48 hours and continue medical therapy. Stopped due to spontaneous RP bleed (agree).  Remains free of angina pectoris. Reemphasized the importance of secondary prevention with focus on improving her modifiable cardiovascular risk factors. Monitor clinically  Acute retroperitoneal bleed CT abdomen pelvis with contrast December 09, 2023 Status post blood transfusion, platelet transfusion, protamine , DDAVP  Received multiple units of PRBCs. Continue to monitor H&H.  Now stabilizing. Once blood indices are stable recommend retrial of antiplatelets given recent TAVR. Will hold off on anticoagulation for thromboembolic prophylaxis given his A-fib as the risk of bleeding is greater than the benefit at this time.  He will need to be evaluated for left atrial appendage closure device i.e. watchman as outpatient with his cardiology at [facility]  This has been conveyed to both patient and wife at bedside.  COVID-19 infection: Currently on nasal cannula oxygen.  Managed by CCM colleagues  Status post TAVR September 2024 at [facility] health: Based on records from Care Everywhere he had a TAVR in September 2024. Was on antiplatelet therapy post TAVR which now has to be held secondary to spontaneous acute retroperitoneal bleed. Will retrial aspirin  81 mg p.o. daily given the recent TAVR.   Will need outpatient follow-up with his cardiologist at [facility].  Coronary artery disease status post  CABG: Denies anginal chest pain.   Last dose of Plavix  12/08/2023. Will restart aspirin  81 mg daily 12/11/2023, retrial.  Continue if no evidence of recurrent bleed or change in blood indices. 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   Will uptitrate antianginal therapy and GDMT based on clinical trajectory.  Chronic HFpEF:  Stage C, NYHA class II.  Uptitration of GDMT difficult due to soft blood pressures and AKI requiring CRRT.  Follow-up as outpatient.  Paroxysmal atrial fibrillation:  Restart metoprolol  as hemodynamics allow. Hold Eliquis  for thromboembolic prophylaxis given the recent spontaneous retroperitoneal bleed requiring blood products, protamine , and DDAVP . As discussed above at this time in the setting of acute spontaneous retroperitoneal bleed the risk of bleeding is greater than the benefit despite his CHA2DS2-VASc score.  I have explained this to the patient and wife in detail.  They should be more cognizant of strokelike symptoms and if present should come to the closest ER via EMS for further evaluation and management.  Recommend outpatient evaluation for possible watchman vs. Retrial.   History of complete heart block, sick sinus syndrome, pacemaker implant: Telemetry notes paced rhythm.  Hyperlipidemia: Continue Lipitor  80 mg p.o. daily.  Madonna Large, DO, [facility]  [address] [place], [place] [address] Pager: [phone] Office: [phone] 12/11/23 10:23 AM

## 2023-12-11 NOTE — Progress Notes (Signed)
 PCA IV dilaudid 27ml wasted. Waste witnessed by Amadeo Garnet

## 2023-12-11 NOTE — TOC CM/SW Note (Signed)
 Transition of Care [facility]) - Inpatient Brief Assessment   Patient Details  Name: Shane [name] MRN: [id number] Date of Birth: 1941/11/22  Transition of Care [facility]) CM/SW Contact:    Tom-Johnson, Margot Oriordan Daphne, RN Phone Number: 12/11/2023, 1:43 PM   Clinical Narrative:  Patient presented to the ED with SOB. Currently on CPAP, uses 2L O2 at home. CRRT restarted 12/09/23. Nephrology following. GI following for Dysphagia. Cardiology following for NSTEMI. WOC following for Lt Foot wound.  Patient not Medically ready for discharge.  CM will continue to follow as patient progresses with care towards discharge.        Transition of Care Asessment:

## 2023-12-12 ENCOUNTER — Other Ambulatory Visit: Payer: Self-pay

## 2023-12-12 ENCOUNTER — Inpatient Hospital Stay (HOSPITAL_COMMUNITY): Payer: Medicare Other

## 2023-12-12 DIAGNOSIS — I214 Non-ST elevation (NSTEMI) myocardial infarction: Secondary | ICD-10-CM

## 2023-12-12 DIAGNOSIS — U071 COVID-19: Principal | ICD-10-CM

## 2023-12-12 DIAGNOSIS — E119 Type 2 diabetes mellitus without complications: Secondary | ICD-10-CM

## 2023-12-12 DIAGNOSIS — N179 Acute kidney failure, unspecified: Secondary | ICD-10-CM | POA: Diagnosis not present

## 2023-12-12 DIAGNOSIS — E875 Hyperkalemia: Secondary | ICD-10-CM

## 2023-12-12 DIAGNOSIS — Z794 Long term (current) use of insulin: Secondary | ICD-10-CM

## 2023-12-12 DIAGNOSIS — I5023 Acute on chronic systolic (congestive) heart failure: Secondary | ICD-10-CM | POA: Diagnosis not present

## 2023-12-12 DIAGNOSIS — Z952 Presence of prosthetic heart valve: Secondary | ICD-10-CM

## 2023-12-12 DIAGNOSIS — R1319 Other dysphagia: Secondary | ICD-10-CM

## 2023-12-12 DIAGNOSIS — R58 Hemorrhage, not elsewhere classified: Secondary | ICD-10-CM

## 2023-12-12 DIAGNOSIS — J9621 Acute and chronic respiratory failure with hypoxia: Secondary | ICD-10-CM

## 2023-12-12 DIAGNOSIS — I480 Paroxysmal atrial fibrillation: Secondary | ICD-10-CM

## 2023-12-12 DIAGNOSIS — Z951 Presence of aortocoronary bypass graft: Secondary | ICD-10-CM

## 2023-12-12 DIAGNOSIS — D649 Anemia, unspecified: Secondary | ICD-10-CM

## 2023-12-12 LAB — RENAL FUNCTION PANEL
Albumin: 2.4 g/dL — ABNORMAL LOW (ref 3.5–5.0)
Anion gap: 10 (ref 5–15)
BUN: 55 mg/dL — ABNORMAL HIGH (ref 8–23)
CO2: 25 mmol/L (ref 22–32)
Calcium: 7.7 mg/dL — ABNORMAL LOW (ref 8.9–10.3)
Chloride: 95 mmol/L — ABNORMAL LOW (ref 98–111)
Creatinine, Ser: 2.22 mg/dL — ABNORMAL HIGH (ref 0.61–1.24)
GFR, Estimated: 29 mL/min — ABNORMAL LOW (ref >60)
Glucose, Bld: 216 mg/dL — ABNORMAL HIGH (ref 70–99)
Phosphorus: 4.4 mg/dL (ref 2.5–4.6)
Potassium: 3.7 mmol/L (ref 3.5–5.1)
Sodium: 130 mmol/L — ABNORMAL LOW (ref 135–145)

## 2023-12-12 LAB — GLUCOSE, CAPILLARY
Glucose-Capillary: 194 mg/dL — ABNORMAL HIGH (ref 70–99)
Glucose-Capillary: 197 mg/dL — ABNORMAL HIGH (ref 70–99)
Glucose-Capillary: 304 mg/dL — ABNORMAL HIGH (ref 70–99)
Glucose-Capillary: 309 mg/dL — ABNORMAL HIGH (ref 70–99)

## 2023-12-12 LAB — CBC
HCT: 21.3 % — ABNORMAL LOW (ref 39.0–52.0)
HCT: 29 % — ABNORMAL LOW (ref 39.0–52.0)
Hemoglobin: 7 g/dL — ABNORMAL LOW (ref 13.0–17.0)
Hemoglobin: 9.5 g/dL — ABNORMAL LOW (ref 13.0–17.0)
MCH: 27.9 pg (ref 26.0–34.0)
MCH: 27.7 pg (ref 26.0–34.0)
MCHC: 32.9 g/dL (ref 30.0–36.0)
MCHC: 32.8 g/dL (ref 30.0–36.0)
MCV: 84.9 fL (ref 80.0–100.0)
MCV: 84.5 fL (ref 80.0–100.0)
Platelets: 142 10*3/uL — ABNORMAL LOW (ref 150–400)
Platelets: 137 10*3/uL — ABNORMAL LOW (ref 150–400)
RBC: 2.51 MIL/uL — ABNORMAL LOW (ref 4.22–5.81)
RBC: 3.43 MIL/uL — ABNORMAL LOW (ref 4.22–5.81)
RDW: 17.2 % — ABNORMAL HIGH (ref 11.5–15.5)
RDW: 15.9 % — ABNORMAL HIGH (ref 11.5–15.5)
WBC: 14.5 10*3/uL — ABNORMAL HIGH (ref 4.0–10.5)
WBC: 13.9 10*3/uL — ABNORMAL HIGH (ref 4.0–10.5)
nRBC: 0.6 % — ABNORMAL HIGH (ref 0.0–0.2)
nRBC: 0.4 % — ABNORMAL HIGH (ref 0.0–0.2)

## 2023-12-12 LAB — VITAMIN C: Vitamin C: 0.7 mg/dL (ref 0.4–2.0)

## 2023-12-12 LAB — PROTIME-INR
INR: 1.2 (ref 0.8–1.2)
Prothrombin Time: 15.3 s — ABNORMAL HIGH (ref 11.4–15.2)

## 2023-12-12 LAB — MAGNESIUM: Magnesium: 2.7 mg/dL — ABNORMAL HIGH (ref 1.7–2.4)

## 2023-12-12 LAB — PREPARE RBC (CROSSMATCH)

## 2023-12-12 MED ORDER — FUROSEMIDE 10 MG/ML IJ SOLN
80.0000 mg | Freq: Once | INTRAMUSCULAR | Status: AC
  Administered 2023-12-12: 80 mg via INTRAVENOUS
  Filled 2023-12-12: qty 8

## 2023-12-12 MED ORDER — NEPRO/CARBSTEADY PO LIQD
237.0000 mL | Freq: Two times a day (BID) | ORAL | Status: DC
  Administered 2023-12-12 – 2023-12-28 (×31): 237 mL via ORAL

## 2023-12-12 MED ORDER — SODIUM CHLORIDE 0.9% IV SOLUTION: Freq: Once | INTRAVENOUS | Status: AC

## 2023-12-12 MED ORDER — FUROSEMIDE 10 MG/ML IJ SOLN: 20.0000 mg | Freq: Once | INTRAMUSCULAR | Status: DC

## 2023-12-12 MED ORDER — ACETAMINOPHEN 325 MG PO TABS
650.0000 mg | ORAL_TABLET | Freq: Once | ORAL | Status: AC
  Administered 2023-12-12: 650 mg via ORAL
  Filled 2023-12-12: qty 2

## 2023-12-12 MED ORDER — DIPHENHYDRAMINE HCL 25 MG PO CAPS
25.0000 mg | ORAL_CAPSULE | Freq: Once | ORAL | Status: AC
  Administered 2023-12-12: 25 mg via ORAL
  Filled 2023-12-12: qty 1

## 2023-12-12 NOTE — Progress Notes (Signed)
 Notified Elink MD (Paliwal,MD) of Hgb of 7.0. No signs of bleeding visualized by this RN. Pts vital signs stable. No new orders at this time.

## 2023-12-12 NOTE — Progress Notes (Signed)
 [facility] Progress Note   Assessment/ Plan:   1.  Acute kidney injury: Appears to be hemodynamically mediated possibly from a combination of CHF exacerbation +/- sepsis with evolution to ATN.   - Crrt 1/2-1/3.  Then CRRT was paused but restarted 12/09/23 in setting of RP bleed.  Stopped CRRT on 1/6 AM.   - No acute indication for dialysis Assess dialysis needs daily.   - continue nontunneled catheter  2.  Hyperkalemia:  - stop lokelma  - changed ensure to nepro   - diet education provided to pt and wife  3.  Anion gap metabolic acidosis:  - improved s/p CRRT   4.  Shock - improved: Appears to be largely cardiogenic but cannot rule out septic component with presentation symptoms suggestive of LRTI.  He was started on antibiotic coverage for community-acquired pneumonia and has covid. - s/p abx per primary team   5. Acute HF with reduced EF - s/p CRRT - watching for dialysis needs daily - lasix  intermittently - will give lasix  80 mg IV once today   6. Covid 19 PNA - Therapies primary team  - optimizing volume status as above   7.  Acute hypoxic respiratory failure: Appears to be from a combination of CHF exacerbation +/- underlying interstitial lung disease and now community-acquired pneumonia and covid. - optimize volume with diuretics and HD if needed    8.  Normocytic Anemia:  - secondary to RP bleed and critical illness.   - PRBC's per primary team.   - Note pt with NSTEMI which was felt to be supply/demand ischemia in setting of covid with underlying cardiomyopathy, CAD per cardiology.  Defer ESA for now  9. RP Bleed:  - per PCCM  - PRBC's per primary team   Disposition - continue inpatient monitoring     Subjective:    He had minimal UF on 1/6 before CRRT was stopped (30 mL).  He had 425 mL uop over 1/6. He got lasix  80 mg IV once yesterday.  He would like to take off of his CPAP and transition to oxygen.  Feels good.    Review of systems:  He denies  shortness of breath  Denies chest pain Denies n/v    Objective:   BP (!) 125/55 (BP Location: Left Arm)   Pulse (!) 58   Temp 97.8 F (36.6 C) (Oral)   Resp 13   Ht 6' (1.829 m)   Wt 108.4 kg   SpO2 100%   BMI 32.41 kg/m   Intake/Output Summary (Last 24 hours) at 12/12/2023 0815 Last data filed at 12/12/2023 0200 Gross per 24 hour  Intake 168.12 ml  Output 440 ml  Net -271.88 ml   Weight change:   Physical Exam:   General elderly male in bed in no acute distress HEENT normocephalic atraumatic extraocular movements intact sclera anicteric Neck supple trachea midline Lungs clear to auscultation bilaterally normal work of breathing at rest on CPAP nasal mask from overnight  Heart S1S2 no rub Abdomen soft nontender nondistended Extremities 1+ edema lower extremities; right BKA Psych normal mood and affect Neuro alert and oriented x 3 provides hx and follows commands Access: RIJ non-tunneled HD catheter  Imaging: No results found.   Labs: BMET Recent Labs  Lab 12/08/23 1609 12/09/23 0348 12/09/23 0929 12/09/23 1606 12/10/23 0416 12/10/23 1607 12/11/23 0307 12/12/23 0339  NA 135 133* 132* 133* 134* 132* 132* 130*  K 4.3 4.9 5.7* 4.8 4.4 4.2 4.1 3.7  CL  99 99 97* 98 100 97* 98 95*  CO2 26 25 25 25 25 25 25 25   GLUCOSE 113* 186* 215* 175* 118* 126* 149* 216*  BUN 41* 56* 62* 63* 43* 37* 32* 55*  CREATININE 1.42* 1.61* 1.92* 1.86* 1.58* 1.65* 1.64* 2.22*  CALCIUM  8.5* 8.2* 8.1* 8.0* 7.6* 7.7* 7.6* 7.7*  PHOS 3.5 4.7*  --  6.0* 4.5 4.8* 3.9 4.4   CBC Recent Labs  Lab 12/06/23 1840 12/06/23 2311 12/10/23 0416 12/10/23 0904 12/11/23 0038 12/11/23 0307 12/11/23 1745 12/12/23 0339  WBC 8.8   < > 12.3*  --   --  16.6* 17.8* 14.5*  NEUTROABS 6.7  --   --   --   --   --   --   --   HGB 9.5*   < > 7.8*   < > 8.0* 8.0* 7.5* 7.0*  HCT 30.1*   < > 23.7*   < > 25.0* 24.4* 22.9* 21.3*  MCV 86.2   < > 83.2  --   --  85.3 85.4 84.9  PLT 192   < > 168  --   --  146*  157 142*   < > = values in this interval not displayed.    Medications:     atorvastatin   80 mg Oral Daily   Chlorhexidine  Gluconate Cloth  6 each Topical Daily   dexamethasone   6 mg Oral QHS   docusate sodium   100 mg Oral BID   escitalopram   10 mg Oral Daily   feeding supplement  237 mL Oral BID BM   gabapentin   300 mg Oral Daily   insulin  aspart  0-20 Units Subcutaneous TID WC   insulin  aspart  0-5 Units Subcutaneous QHS   insulin  glargine-yfgn  10 Units Subcutaneous Daily   multivitamin  1 tablet Oral QHS   pantoprazole   40 mg Oral Daily   sodium zirconium cyclosilicate   10 g Oral Daily    Katheryn JAYSON Saba, MD 12/12/2023, 8:54 AM

## 2023-12-12 NOTE — Evaluation (Signed)
 Physical Therapy Evaluation Patient Details Name: Shane [name] MRN: [id number] DOB: March 27, 1941 Today's Date: 12/12/2023  History of Present Illness  83 year old male presented to ED 1/1 with shortness of breath.  Dx: Covid 19 PNA, Acute kidney injury, Acute hypoxic respiratory failure, Normocytic Anemia - secondary to RP bleed and critical illness, Acute HF with reduced EF, s/p CRRT. With significant past medical history including obstructive sleep apnea on CPAP at night, CAD s/p CABG x3 in 2021, aortic stenosis s/p TAVR 08/2023 at [facility], HFpEF, atrial fibrillation on Eliquis , plavix , sick sinus syndrome s/p leadless PPM, hypertension, hyperlipidemia, GERD, uncontrolled diabetes, CKD 3, pulmonary fibrosis  Clinical Impression  Pt admitted with above diagnosis. Uses RW for short distances with RLE prosthesis, and scooter for longer distances at baseline. He is typically able to navigate steps with a rail. Currently pt required CGA for bed mobility and lateral scoot along bed. Fearful of falling anteriorly with posterior lean bias while EOB. Does not feel comfortable standing on LLE without RLE prosthesis (Wife plans to bring for next PT visit.) VSS throughout session with SpO2 100% on 4L. He becomes moderately dyspneic with basic scoot along bed but recovers quickly. Anticipate he will progress well during admission. Pt and wife would like to return home and are agreeable to HHPT follow-up which they have had arranged in the past and benefited from. Will continue to progress acutely during admission and update recs as appropriate. Pt currently with functional limitations due to the deficits listed below (see PT Problem List). Pt will benefit from acute skilled PT to increase their independence and safety with mobility to allow discharge.           If plan is discharge home, recommend the following: A little help with walking and/or transfers;A little help with bathing/dressing/bathroom;Assistance with  cooking/housework;Assist for transportation;Help with stairs or ramp for entrance   Can travel by private vehicle        Equipment Recommendations None recommended by PT  Recommendations for Other Services       Functional Status Assessment Patient has had a recent decline in their functional status and demonstrates the ability to make significant improvements in function in a reasonable and predictable amount of time.     Precautions / Restrictions Precautions Precautions: Fall Precaution Comments: Lt disolateral foot wound. Prior Rt BKA Required Braces or Orthoses:  (RLE prosthesis) Restrictions Weight Bearing Restrictions Per Provider Order: No      Mobility  Bed Mobility Overal bed mobility: Needs Assistance Bed Mobility: Supine to Sit, Sit to Supine     Supine to sit: Contact guard, +2 for safety/equipment, HOB elevated, Used rails Sit to supine: Contact guard assist, +2 for safety/equipment   General bed mobility comments: CGA +2 for safety. Cues for sequencing and rail use as needed. Effortful but able to rise on his own. Some posterior instability but self corrects. Fearful of falling forward and hesitant to scoot to EOB to place LLE on the ground. VSS. Returns to supine with assist to manage lines/leads. Assisted with scooting up in bed lightly.    Transfers Overall transfer level: Needs assistance                 General transfer comment: Performed lateral scoot along bed without therapist's physical assistance but guarded from the front, pt fearful of falling forward, demonstrating minor instability. Easily winded. Gets better propulsion when he uses Lt foot on the floor. Cues for technique.    Ambulation/Gait  General Gait Details: Deferred, awaiting prosthesis, pt does not feel comfortable standing on LLE without Rt prosthesis due to Lt foot wound and weakness.  Stairs            Wheelchair Mobility     Tilt Bed     Modified Rankin (Stroke Patients Only)       Balance Overall balance assessment: Needs assistance Sitting-balance support: No upper extremity supported, Feet supported Sitting balance-Leahy Scale: Fair Sitting balance - Comments: Progressed to CGA, initially with some posterior instability. Postural control: Posterior lean                                   Pertinent Vitals/Pain Pain Assessment Pain Assessment: Faces Pain Location: Rt flank and abdomen laterally Pain Descriptors / Indicators: Aching Pain Intervention(s): Monitored during session, Repositioned    Home Living Family/patient expects to be discharged to:: Private residence Living Arrangements: Spouse/significant other Available Help at Discharge: Family Type of Home: House Home Access: Ramped entrance       Home Layout: Two level;Able to live on main level with bedroom/bathroom Home Equipment: Shower seat;Rolling Walker (2 wheels);Electric scooter;Wheelchair - manual;Cane - single point Additional Comments: `    Prior Function Prior Level of Function : Independent/Modified Independent             Mobility Comments: Can walk short distances with prosthesis, RW, uses electric scooter quite a bit. 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 States he can navigate stairs with a rail at baseline.       Extremity/Trunk Assessment   Upper Extremity Assessment Upper Extremity Assessment: Defer to OT evaluation    Lower Extremity Assessment Lower Extremity Assessment: Generalized weakness;RLE deficits/detail;LLE deficits/detail RLE Deficits / Details: Hx Rt BKA; instructed to wear shrinker during admission LLE Deficits / Details: Bandaged wound on distolateral Lt foot.       Communication   Communication Communication: No apparent difficulties  Cognition Arousal: Alert Behavior During Therapy: WFL for tasks assessed/performed Overall Cognitive Status: Within  Functional Limits for tasks assessed                                 General Comments: Slight delay with responses at times.        General Comments General comments (skin integrity, edema, etc.): SpO2 100% on 4L during session. VSS    Exercises General Exercises - Lower Extremity Ankle Circles/Pumps: AROM, Left, 10 reps, Supine Quad Sets: Strengthening, Both, 10 reps, Supine   Assessment/Plan    PT Assessment Patient needs continued PT services  PT Problem List Decreased strength;Decreased activity tolerance;Decreased balance;Decreased mobility;Cardiopulmonary status limiting activity;Obesity;Pain;Decreased skin integrity       PT Treatment Interventions DME instruction;Gait training;Functional mobility training;Therapeutic activities;Therapeutic exercise;Balance training;Neuromuscular re-education;Patient/family education    PT Goals (Current goals can be found in the Care Plan section)  Acute Rehab PT Goals Patient Stated Goal: Get well, go home PT Goal Formulation: With patient/family Time For Goal Achievement: 12/26/23 Potential to Achieve Goals: Good    Frequency Min 1X/week     Co-evaluation PT/OT/SLP Co-Evaluation/Treatment: Yes Reason for Co-Treatment: Complexity of the patient's impairments (multi-system involvement);To address functional/ADL transfers;For patient/therapist safety PT goals addressed during session: Mobility/safety with mobility;Balance;Strengthening/ROM         AM-PAC PT 6 Clicks Mobility  Outcome Measure Help needed turning from your back to  your side while in a flat bed without using bedrails?: None Help needed moving from lying on your back to sitting on the side of a flat bed without using bedrails?: A Little Help needed moving to and from a bed to a chair (including a wheelchair)?: A Little Help needed standing up from a chair using your arms (e.g., wheelchair or bedside chair)?: A Little Help needed to walk in hospital  room?: A Little Help needed climbing 3-5 steps with a railing? : A Lot 6 Click Score: 18    End of Session Equipment Utilized During Treatment: Oxygen Activity Tolerance: Patient tolerated treatment well (Needs prosthesis for gait) Patient left: in bed;with call bell/phone within reach;with bed alarm set;with family/visitor present   PT Visit Diagnosis: Muscle weakness (generalized) (M62.81);Difficulty in walking, not elsewhere classified (R26.2);Pain Pain - Right/Left: Right Pain - part of body:  (flank and abdomen)    Time: 9059-8990 PT Time Calculation (min) (ACUTE ONLY): 29 min   Charges:   PT Evaluation $PT Eval Moderate Complexity: 1 Mod   PT General Charges $$ ACUTE PT VISIT: 1 Visit         Leontine Roads, PT, DPT [facility]  Rehabilitation Services Physical Therapist Office: [phone] Website: [email or web address]   Leontine GORMAN Roads 12/12/2023, 10:52 AM

## 2023-12-12 NOTE — Evaluation (Signed)
 Occupational Therapy Evaluation Patient Details Name: Shane [name] MRN: [id number] DOB: [birth year] Today's Date: 12/12/2023   History of Present Illness 83 year old male presented to ED 1/1 with shortness of breath.  Dx: Covid 19 PNA, Acute kidney injury, Acute hypoxic respiratory failure, Normocytic Anemia - secondary to RP bleed and critical illness, Acute HF with reduced EF, s/p CRRT. With significant past medical history including obstructive sleep apnea on CPAP at night, CAD s/p CABG x3 in 2021, aortic stenosis s/p TAVR 08/2023 at [facility], HFpEF, atrial fibrillation on Eliquis , plavix , sick sinus syndrome s/p leadless PPM, hypertension, hyperlipidemia, GERD, uncontrolled diabetes, CKD 3, pulmonary fibrosis   Clinical Impression   PTA, pt lives with spouse and typically Modified Independent with ADLs, transfers and mobility using R prosthetic LE. Pt reports using electric scooter primarily but able to walk a short distance if needed. Pt presents now with deficits in strength and endurance though anticipate pt to make quick progress when spouse brings R prosthetic LE to use during sessions. Overall, pt requires CGA for bed mobility and lateral scoots along bedside. Pt requires Setup for UB ADLs and Min-Mod A for LB ADLS w/ lateral leans vs bed level. Will continue to follow acutely but anticipate no OT needed at DC.       If plan is discharge home, recommend the following: A little help with bathing/dressing/bathroom;Assistance with cooking/housework    Functional Status Assessment  Patient has had a recent decline in their functional status and demonstrates the ability to make significant improvements in function in a reasonable and predictable amount of time.  Equipment Recommendations  None recommended by OT    Recommendations for Other Services       Precautions / Restrictions Precautions Precautions: Fall Precaution Comments: Lt disolateral foot wound. Prior Rt BKA. Wears 2 L O2 at  night Required Braces or Orthoses:  (RLE prosthesis) Restrictions Weight Bearing Restrictions Per Provider Order: No      Mobility Bed Mobility Overal bed mobility: Needs Assistance Bed Mobility: Supine to Sit, Sit to Supine     Supine to sit: Contact guard, +2 for safety/equipment, HOB elevated, Used rails Sit to supine: Contact guard assist, +2 for safety/equipment   General bed mobility comments: CGA +2 for safety. Cues for sequencing and rail use as needed. Effortful but able to rise on his own. Some posterior instability but self corrects. Fearful of falling forward and hesitant to scoot to EOB to place LLE on the ground. VSS. Returns to supine with assist to manage lines/leads. Assisted with scooting up in bed lightly.    Transfers Overall transfer level: Needs assistance                 General transfer comment: Performed lateral scoot along bed without therapist's physical assistance but guarded from the front, pt fearful of falling forward, demonstrating minor instability. Easily winded. Gets better propulsion when he uses Lt foot on the floor. Cues for technique.      Balance Overall balance assessment: Needs assistance Sitting-balance support: No upper extremity supported, Feet supported Sitting balance-Leahy Scale: Fair Sitting balance - Comments: Progressed to CGA, initially with some posterior instability. Postural control: Posterior lean                                 ADL either performed or assessed with clinical judgement   ADL Overall ADL's : Needs assistance/impaired Eating/Feeding: Independent   Grooming: Set up;Sitting  Upper Body Bathing: Set up;Sitting   Lower Body Bathing: Minimal assistance;Bed level;Sitting/lateral leans   Upper Body Dressing : Set up;Sitting   Lower Body Dressing: Moderate assistance;Sitting/lateral leans;Bed level       Toileting- Clothing Manipulation and Hygiene: Moderate assistance;Sitting/lateral  lean;Bed level         General ADL Comments: Limited in OOB ADLs d/t no prosthetic LE in room and pt typically does not transfer without LE     Vision Baseline Vision/History: 1 Wears glasses Ability to See in Adequate Light: 0 Adequate Patient Visual Report: No change from baseline Vision Assessment?: No apparent visual deficits     Perception         Praxis         Pertinent Vitals/Pain Pain Assessment Pain Assessment: Faces Faces Pain Scale: Hurts little more Pain Location: Rt flank and abdomen laterally Pain Descriptors / Indicators: Aching Pain Intervention(s): Limited activity within patient's tolerance, Monitored during session     Extremity/Trunk Assessment Upper Extremity Assessment Upper Extremity Assessment: Overall WFL for tasks assessed;Right hand dominant   Lower Extremity Assessment Lower Extremity Assessment: Defer to PT evaluation RLE Deficits / Details: Hx Rt BKA; instructed to wear shrinker during admission LLE Deficits / Details: Bandaged wound on distolateral Lt foot.   Cervical / Trunk Assessment Cervical / Trunk Assessment: Normal   Communication Communication Communication: No apparent difficulties   Cognition Arousal: Alert Behavior During Therapy: WFL for tasks assessed/performed Overall Cognitive Status: Within Functional Limits for tasks assessed                                       General Comments  SpO2 100% on 4 L O2. Encouraged upright positioning for lung health and frequent repositioning for skin integrity of bottom    Exercises     Shoulder Instructions      Home Living Family/patient expects to be discharged to:: Private residence Living Arrangements: Spouse/significant other Available Help at Discharge: Family Type of Home: House Home Access: Ramped entrance     Home Layout: Two level;Able to live on main level with bedroom/bathroom     Bathroom Shower/Tub: Walk-in shower   Bathroom Toilet:  Handicapped height     Home Equipment: Pharmacist, Hospital (2 wheels);Electric scooter;Wheelchair - manual;Cane - single point;Other (comment) (lift chair, transfer pole beside bed)   Additional Comments: `      Prior Functioning/Environment Prior Level of Function : Independent/Modified Independent             Mobility Comments: Can walk short distances with prosthesis, RW, uses electric scooter quite a bit. 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 States he can navigate stairs with a rail at baseline. ADLs Comments: Mod I with ADLs. Wife does IADLs        OT Problem List: Decreased strength;Decreased activity tolerance;Impaired balance (sitting and/or standing);Cardiopulmonary status limiting activity      OT Treatment/Interventions: Self-care/ADL training;Therapeutic exercise;Energy conservation;DME and/or AE instruction;Therapeutic activities;Patient/family education;Balance training    OT Goals(Current goals can be found in the care plan section) Acute Rehab OT Goals Patient Stated Goal: regain strength, home soon OT Goal Formulation: With patient/family Time For Goal Achievement: 12/26/23 Potential to Achieve Goals: Good  OT Frequency: Min 1X/week    Co-evaluation PT/OT/SLP Co-Evaluation/Treatment: Yes Reason for Co-Treatment: Complexity of the patient's impairments (multi-system involvement);To address functional/ADL transfers;For patient/therapist safety PT goals addressed during session:  Mobility/safety with mobility;Balance;Strengthening/ROM OT goals addressed during session: ADL's and self-care;Proper use of Adaptive equipment and DME      AM-PAC OT 6 Clicks Daily Activity     Outcome Measure Help from another person eating meals?: None Help from another person taking care of personal grooming?: A Little Help from another person toileting, which includes using toliet, bedpan, or urinal?: A Lot Help from another person  bathing (including washing, rinsing, drying)?: A Lot Help from another person to put on and taking off regular upper body clothing?: A Little Help from another person to put on and taking off regular lower body clothing?: A Lot 6 Click Score: 16   End of Session Equipment Utilized During Treatment: Oxygen Nurse Communication: Mobility status  Activity Tolerance: Patient tolerated treatment well Patient left: in bed;with call bell/phone within reach;with family/visitor present  OT Visit Diagnosis: Unsteadiness on feet (R26.81);Other abnormalities of gait and mobility (R26.89);Muscle weakness (generalized) (M62.81)                Time: 9058-8991 OT Time Calculation (min): 27 min Charges:  OT General Charges $OT Visit: 1 Visit OT Evaluation $OT Eval Moderate Complexity: 1 Mod  Mliss NOVAK, OTR/L Acute Rehab Services Office: [phone]   Mliss Getting 12/12/2023, 11:44 AM

## 2023-12-12 NOTE — Progress Notes (Signed)
 PROGRESS NOTE    Shane [name]  FMW:[id number] DOB: [birth year] DOA: 12/06/2023 PCP: Shepard Ade, MD    Chief Complaint  Patient presents with   Shortness of Breath    Brief Narrative: 83 year old male with significant past medical history including obstructive sleep apnea on CPAP at night, CAD s/p CABG x3 in 2021, aortic stenosis s/p TAVR 08/2023 at [facility], HFpEF, atrial fibrillation on Eliquis , plavix , sick sinus syndrome s/p leadless PPM, hypertension, hyperlipidemia, GERD, uncontrolled diabetes, CKD 3, pulmonary fibrosis followed by Dr. Geronimo, who presented to the emergency department on 12/06/2023 with shortness of breath.  Per the ED provider complaining of cough and shortness of breath over the past 3 days.  Using his nebulizers at home with no improvement in his symptoms.  EMS was called and found to be hypotensive and hypoxic.  He was started on nonrebreather and an epinephrine  infusion prior to arrival.  In the ED afebrile, SBP around 100, tachypneic.  Labs +COVID, AKI on CKD with BUN/Cr 116, 4.69 with critical K of 7.1. bicarb 16. CBC without leukocytosis, hgb 9.5. lactic 2.3>1.9, troponin 8,290, UA negative, blood cultures pending. CXR with likely pulmonary edema. With new AKI, hyperkalemia and EKG changes, nephrology was consulted. Patient given lokelma , albuterol , Lasix , sodium bicarb, 5U insulin  with D50, calcium  gluconate. Was also given azithromycin /rocephin  CAP coverage. Neprhology recommended albumin  for BP support (initially on 2mcg levo, now off), Lasix  80mg  IV, renal ultrasound. Pending repeat BMP on consult for admission to ICU.     Assessment & Plan:   Principal Problem:   Acute kidney injury (nontraumatic) (HCC) Active Problems:   COVID-19 virus infection   PAD (peripheral artery disease) (HCC)   Atrial fibrillation (HCC)   Hyperkalemia   Acute on chronic congestive heart failure (HCC)   Non-ST elevation (NSTEMI) myocardial infarction [facility])   Retroperitoneal  bleeding   History of transcatheter aortic valve replacement (TAVR)   Chronic heart failure with preserved ejection fraction (HFpEF) (HCC)   Hx of CABG   Atherosclerosis of native coronary artery of native heart without angina pectoris   Acute on chronic hypoxic respiratory failure (HCC)   Anemia   Esophageal dysphagia   COVID-19   #1 acute on chronic hypoxic respiratory failure likely multifactorial secondary to acute COVID-19 infection, in the setting of pulmonary edema secondary to acute systolic CHF exacerbation in the setting of underlying pulmonary fibrosis -Patient noted to be on baseline O2 2 L nasal cannula at bedtime -Patient noted to have presented with cough, shortness of breath x 3 days with no improvement on home nebulizers. -EMS noted on presentation patient to be hypoxic. -Patient states wife and grandson had been sick with COVID infection recently. -MRSA PCR negative. -SARS coronavirus 2 PCR positive, influenza A and B by PCR negative. -Initial chest x-ray findings concerning for CHF exacerbation. -Patient initially received a dose of IV azithromycin  and Rocephin  x 1. -Patient status post 3-day course of remdesivir  and currently on Decadron . -Patient also received IV Lasix  with diuresis. -Patient also noted to have undergone CRRT during the hospitalization. -Patient currently on 4 L nasal cannula with sats of 100%. -Patient with clinical improvement. -Wean O2. -Continue IV Lasix .  2.  Non-STEMI -Patient on admission noted to have elevated troponins peaking at 8-9 0 and subsequently trending down. -Felt likely secondary to demand ischemia in the setting of COVID-19 infection with underlying cardiomyopathy CAD/history of CABG. -Patient denies any chest pain. -2D echo obtained with EF of 40%,WMA, moderately dilated left atrial size, mildly  dilated right atrial size, status post bioprosthetic aortic valve/status post TAVR. -Patient seen in consultation by cardiology who  discussed with patient and decision made to treat medically with IV heparin  for 48 hours and continue medical therapy. -IV heparin  discontinued due to spontaneous retroperitoneal bleed. -Per cardiology.  3.  Acute retroperitoneal bleed -Noted on CT abdomen and pelvis with contrast 12/09/2023. -Patient status post transfusion of 1 unit platelets, 2 units PRBCs, thiamine, DDAVP . -Patient noted to have started on aspirin  which has subsequently been discontinued. -Anticoagulation for thromboembolic prophylaxis given A-fib on hold due to risk of bleeding. -Hemoglobin down to 7.0 today from 8.0 (12/11/2023). -Due to presentation with non-STEMI and cardiac history will transfuse 2 units PRBCs and follow H&H. -Transfusion threshold hemoglobin < 8. -Repeat CT abdomen and pelvis to follow-up on retroperitoneal bleed.  4.  Paroxysmal A-fib -Currently rate controlled. -Anticoagulation on hold secondary to retroperitoneal bleed as risk of bleeding outweighs benefits at this time and per cardiology recommendations. -Outpatient follow-up with primary cardiologist for possible evaluation for Watchman versus retrial per cardiology.  5.  History of complete heart block/SSS/status post pacemaker implantation -Stable. -Per cardiology.  6.  Hyperlipidemia -Continue statin.  7.  CAD status post CABG/status post TAVR -Patient to have last dose of Plavix  12/08/2023. -Patient was to be started on aspirin  12/11/2023 per cardiology recommendations however due to drop in hemoglobin aspirin  discontinued. -Per cardiology.  8.  Acute kidney injury -Felt likely hemodynamically mediated from combination of CHF exacerbation, acute infection with evolution to ATN in the setting of ACE inhibitor -Patient being followed by nephrology and underwent CRRT 1/2-1/3. -CRRT paused but restarted 12/09/2023 in the setting of retroperitoneal bleed and stopped on 12/11/2023. -Continue to hold ACE inhibitor -Per nephrology.  9.   Hyperkalemia -Likely secondary to problem #8 -Lokelma  discontinued. -Ensure changed to nephro per nephrology. -Per nephrology.  10.  Anion gap metabolic acidosis -Likely secondary to acute kidney injury. -Improved with CRRT. -Per nephrology.  11.  Shock -Not largely cardiogenic but cannot rule out a septic component.-Patien -Status post dose of antibiotic coverage for community-acquired pneumonia and status post treatment for COVID infection. -Shock resolved.  12.  Normocytic anemia/acute blood loss anemia -Likely secondary to retroperitoneal bleed and acute illness. -See above, retroperitoneal bleed.  13.  Dysphagia -Patient seen in consultation by GI, patient noted to have some intermittent dysphagia mostly to solids, dense bread and meat occasionally to liquids. -GI and patient deferring any procedures at this point and GI recommending patient will benefit from EGD for evaluation of possible esophageal dilatation based on his symptoms in the outpatient setting which will be arranged. -GI recommending consideration of obtaining barium esophagram prior to discharge to exclude any high risk lesions.  14.  Diabetes mellitus type 2 -Hemoglobin A1c 8.5 (12/06/2023) -Patient noted to be on Farxiga , Tresiba  20 units daily, prior to admission. -Patient presenting acute kidney injury, Farxiga  on hold. -Patient also noted on Decadron . -Continue Semglee  10 units daily, SSI, Neurontin . -CBG 194 this morning.    DVT prophylaxis: SCDs Code Status: Full Family Communication: Updated patient and wife at bedside. Disposition: TBD  Status is: Inpatient Remains inpatient appropriate because: Severity of illness   Consultants:  Nephrology: Dr. Tobie 12/06/2023 Cardiology: Dr. Duffy 12/07/2023 Wound care RN General Surgery: Dr. Stevie 12/09/2023 Interventional radiology: Dr. Alona 12/09/2023 Gastroenterology: Dr. Rollin 12/09/2023 PCCM admission  Procedures:  CT abdomen and pelvis  12/09/2023 Chest x-ray 12/06/2023, 12/07/2023 Renal ultrasound 12/06/2023 2D echo 12/07/2023 Transfusion 1 unit platelets 12/09/2023 Transfusion  2 units PRBCs (1/4, 1/5) Transfused 2 units PRBCs 12/11/18/2025  Significant Hospital Events: Including procedures, antibiotic start and stop dates in addition to other pertinent events   1/1: Admit to CCM for covid pneumonia, aki with hyperK likely needing CRRT  1/2: CRRT initiated, Card Consult due to elev Troponin (Nstemi vs Demand Ischemia)  1/3: Continuing CRRT till this afternoon, PCCM sign off this afternoon, trx to [facility] tomorrow  1/4 RPB -- multipl PRBC. CCS and Ir consults. Medical mgmnt.  1/5 1 PRBC  Antimicrobials: Anti-infectives (From admission, onward)    Start     Dose/Rate Route Frequency Ordered Stop   12/07/23 1000  remdesivir  100 mg in sodium chloride  0.9 % 100 mL IVPB       Placed in Followed by Linked Group   100 mg 200 mL/hr over 30 Minutes Intravenous Daily 12/06/23 2239 12/08/23 0947   12/06/23 2245  remdesivir  200 mg in sodium chloride  0.9% 250 mL IVPB       Placed in Followed by Linked Group   200 mg 580 mL/hr over 30 Minutes Intravenous Once 12/06/23 2239 12/07/23 0142   12/06/23 1815  cefTRIAXone  (ROCEPHIN ) 2 g in sodium chloride  0.9 % 100 mL IVPB        2 g 200 mL/hr over 30 Minutes Intravenous Once 12/06/23 1812 12/06/23 1908   12/06/23 1815  azithromycin  (ZITHROMAX ) 500 mg in sodium chloride  0.9 % 250 mL IVPB        500 mg 250 mL/hr over 60 Minutes Intravenous  Once 12/06/23 1812 12/06/23 2153         Subjective: Patient laying in bed on 4 L nasal cannula with sats of 100%.  Still with intermittent nonproductive cough.  States overall feeling better than he did on admission.  Denies any chest pain.  Feels some improvement with shortness of breath.  No abdominal pain.  Wife at bedside.  Patient states both his wife and grandson recently positive for COVID.  Objective: Vitals:   12/12/23 0727 12/12/23 0800  12/12/23 1100 12/12/23 1115  BP:  (!) 125/55 (!) 117/54 (!) 113/53  Pulse:  (!) 58    Resp:  13    Temp: 97.8 F (36.6 C)  (!) 97.5 F (36.4 C) (!) 96.5 F (35.8 C)  TempSrc: Oral  Oral Axillary  SpO2:  100%    Weight:      Height:        Intake/Output Summary (Last 24 hours) at 12/12/2023 1147 Last data filed at 12/12/2023 0846 Gross per 24 hour  Intake 18.55 ml  Output 650 ml  Net -631.45 ml   Filed Weights   12/08/23 0500 12/10/23 0500 12/11/23 0500  Weight: 108.6 kg 107.9 kg 108.4 kg    Examination:  General exam: Appears calm and comfortable  Respiratory system: Scattered diffuse crackles.  No wheezing.  No rhonchi.  Fair air movement.  On 4 L nasal cannula with sats of 100%.   Cardiovascular system: S1 & S2 heard, RRR. No JVD, murmurs, rubs, gallops or clicks. Gastrointestinal system: Abdomen is nondistended, soft and nontender. No organomegaly or masses felt. Normal bowel sounds heard. Central nervous system: Alert and oriented. No focal neurological deficits. 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  5/5 bilateral upper extremity strength. Skin: No rashes, lesions or ulcers Psychiatry: Judgement and insight appear normal. Mood & affect appropriate.     Data Reviewed: I have personally reviewed following labs and imaging studies  CBC: Recent Labs  Lab 12/06/23 1840 12/06/23 2311 12/09/23 2109 12/09/23 2354 12/10/23 0416 12/10/23 0904 12/10/23 2017 12/11/23 0038 12/11/23 0307 12/11/23 1745 12/12/23 0339  WBC 8.8   < > 11.8*  --  12.3*  --   --   --  16.6* 17.8* 14.5*  NEUTROABS 6.7  --   --   --   --   --   --   --   --   --   --   HGB 9.5*   < > 8.0*   < > 7.8*   < > 8.3* 8.0* 8.0* 7.5* 7.0*  HCT 30.1*   < > 24.5*   < > 23.7*   < > 25.5* 25.0* 24.4* 22.9* 21.3*  MCV 86.2   < > 82.8  --  83.2  --   --   --  85.3 85.4 84.9  PLT 192   < > 174  --  168  --   --   --  146* 157 142*   < > = values in this interval not  displayed.    Basic Metabolic Panel: Recent Labs  Lab 12/08/23 0355 12/08/23 1609 12/09/23 0348 12/09/23 0929 12/09/23 1606 12/10/23 0416 12/10/23 1607 12/11/23 0307 12/12/23 0339  NA 134*   < > 133*   < > 133* 134* 132* 132* 130*  K 5.2*   < > 4.9   < > 4.8 4.4 4.2 4.1 3.7  CL 97*   < > 99   < > 98 100 97* 98 95*  CO2 24   < > 25   < > 25 25 25 25 25   GLUCOSE 170*   < > 186*   < > 175* 118* 126* 149* 216*  BUN 53*   < > 56*   < > 63* 43* 37* 32* 55*  CREATININE 1.66*   < > 1.61*   < > 1.86* 1.58* 1.65* 1.64* 2.22*  CALCIUM  8.5*   < > 8.2*   < > 8.0* 7.6* 7.7* 7.6* 7.7*  MG 2.2  --  2.3  --   --  2.5*  --  2.5* 2.7*  PHOS 3.8   < > 4.7*  --  6.0* 4.5 4.8* 3.9 4.4   < > = values in this interval not displayed.    GFR: Estimated Creatinine Clearance: 32.6 mL/min (A) (by C-G formula based on SCr of 2.22 mg/dL (H)).  Liver Function Tests: Recent Labs  Lab 12/06/23 1840 12/07/23 0500 12/09/23 0929 12/09/23 1606 12/10/23 0416 12/10/23 1607 12/11/23 0307 12/12/23 0339  AST 45*  --  64*  --  53*  --   --   --   ALT 16  --  27  --  26  --   --   --   ALKPHOS 85  --  65  --  64  --   --   --   BILITOT 0.6  --  0.6  --  0.9  --   --   --   PROT 6.8  --  5.8*  --  5.5*  --   --   --   ALBUMIN  2.9*   < > 2.6* 2.6* 2.5* 2.6* 2.5* 2.4*   < > = values in this interval not displayed.    CBG: Recent Labs  Lab 12/11/23 1127 12/11/23 1738 12/11/23 2112 12/12/23 0732 12/12/23 1137  GLUCAP 149* 241* 247* 194* 197*     Recent Results (from the past 240 hours)  Resp  panel by RT-PCR (RSV, Flu A&B, Covid) Anterior Nasal Swab     Status: Abnormal   Collection Time: 12/06/23  6:12 PM   Specimen: Anterior Nasal Swab  Result Value Ref Range Status   SARS Coronavirus 2 by RT PCR POSITIVE (A) NEGATIVE Final   Influenza A by PCR NEGATIVE NEGATIVE Final   Influenza B by PCR NEGATIVE NEGATIVE Final    Comment: (NOTE) The Xpert Xpress SARS-CoV-2/FLU/RSV plus assay is intended as  an aid in the diagnosis of influenza from Nasopharyngeal swab specimens and should not be used as a sole basis for treatment. Nasal washings and aspirates are unacceptable for Xpert Xpress SARS-CoV-2/FLU/RSV testing.  Fact Sheet for Patients: bloggercourse.com  Fact Sheet for Healthcare Providers: seriousbroker.it  This test is not yet approved or cleared by the [place]  FDA and has been authorized for detection and/or diagnosis of SARS-CoV-2 by FDA under an Emergency Use Authorization (EUA). This EUA will remain in effect (meaning this test can be used) for the duration of the COVID-19 declaration under Section 564(b)(1) of the Act, 21 U.S.C. section 360bbb-3(b)(1), unless the authorization is terminated or revoked.     Resp Syncytial Virus by PCR NEGATIVE NEGATIVE Final    Comment: (NOTE) Fact Sheet for Patients: bloggercourse.com  Fact Sheet for Healthcare Providers: seriousbroker.it  This test is not yet approved or cleared by the [place]  FDA and has been authorized for detection and/or diagnosis of SARS-CoV-2 by FDA under an Emergency Use Authorization (EUA). This EUA will remain in effect (meaning this test can be used) for the duration of the COVID-19 declaration under Section 564(b)(1) of the Act, 21 U.S.C. section 360bbb-3(b)(1), unless the authorization is terminated or revoked.  Performed at [facility], [address], [place], [place] [address]   Blood Culture (routine x 2)     Status: None   Collection Time: 12/06/23  6:30 PM   Specimen: BLOOD  Result Value Ref Range Status   Specimen Description BLOOD SITE NOT SPECIFIED  Final   Special Requests   Final    BOTTLES DRAWN AEROBIC AND ANAEROBIC Blood Culture results may not be optimal due to an inadequate volume of blood received in culture bottles   Culture   Final    NO GROWTH 5  DAYS Performed at [facility], [address] [place], [place] [address]    Report Status 12/11/2023 FINAL  Final  Blood Culture (routine x 2)     Status: None   Collection Time: 12/06/23  6:40 PM   Specimen: BLOOD  Result Value Ref Range Status   Specimen Description BLOOD SITE NOT SPECIFIED  Final   Special Requests   Final    BOTTLES DRAWN AEROBIC AND ANAEROBIC Blood Culture results may not be optimal due to an inadequate volume of blood received in culture bottles   Culture   Final    NO GROWTH 5 DAYS Performed at [facility], [address] [place], [place] [address]    Report Status 12/11/2023 FINAL  Final  MRSA Next Gen by PCR, Nasal     Status: None   Collection Time: 12/06/23 11:50 PM   Specimen: Nasal Mucosa; Nasal Swab  Result Value Ref Range Status   MRSA by PCR Next Gen NOT DETECTED NOT DETECTED Final    Comment: (NOTE) The GeneXpert MRSA Assay (FDA approved for NASAL specimens only), is one component of a comprehensive MRSA colonization surveillance program. It is not intended to diagnose  MRSA infection nor to guide or monitor treatment for MRSA infections. Test performance is not FDA approved in patients less than 6 years old. Performed at [facility], [address] [place], [place] [address]          Radiology Studies: No results found.      Scheduled Meds:  atorvastatin   80 mg Oral Daily   Chlorhexidine  Gluconate Cloth  6 each Topical Daily   dexamethasone   6 mg Oral QHS   docusate sodium   100 mg Oral BID   escitalopram   10 mg Oral Daily   feeding supplement (NEPRO CARB STEADY)  237 mL Oral BID BM   gabapentin   300 mg Oral Daily   insulin  aspart  0-20 Units Subcutaneous TID WC   insulin  aspart  0-5 Units Subcutaneous QHS   insulin  glargine-yfgn  10 Units Subcutaneous Daily   multivitamin  1 tablet Oral QHS   pantoprazole   40 mg Oral Daily   Continuous Infusions:   LOS: 6 days    Time spent: 45  minutes    Toribio Hummer, MD [facility]   To contact the attending provider between 7A-7P or the covering provider during after hours 7P-7A, please log into the web site [email or web address] and access using universal [place] password for that web site. If you do not have the password, please call the hospital operator.  12/12/2023, 11:47 AM

## 2023-12-13 DIAGNOSIS — N179 Acute kidney failure, unspecified: Secondary | ICD-10-CM | POA: Diagnosis not present

## 2023-12-13 DIAGNOSIS — U071 COVID-19: Secondary | ICD-10-CM | POA: Diagnosis not present

## 2023-12-13 DIAGNOSIS — I5023 Acute on chronic systolic (congestive) heart failure: Secondary | ICD-10-CM | POA: Diagnosis not present

## 2023-12-13 DIAGNOSIS — E875 Hyperkalemia: Secondary | ICD-10-CM | POA: Diagnosis not present

## 2023-12-13 LAB — TYPE AND SCREEN
ABO/RH(D): O POS
Antibody Screen: NEGATIVE
Unit division: 0
Unit division: 0
Unit division: 0
Unit division: 0
Unit division: 0

## 2023-12-13 LAB — MAGNESIUM: Magnesium: 2.5 mg/dL — ABNORMAL HIGH (ref 1.7–2.4)

## 2023-12-13 LAB — CBC
HCT: 27 % — ABNORMAL LOW (ref 39.0–52.0)
HCT: 29.3 % — ABNORMAL LOW (ref 39.0–52.0)
Hemoglobin: 9.1 g/dL — ABNORMAL LOW (ref 13.0–17.0)
Hemoglobin: 9.9 g/dL — ABNORMAL LOW (ref 13.0–17.0)
MCH: 28.3 pg (ref 26.0–34.0)
MCH: 28.5 pg (ref 26.0–34.0)
MCHC: 33.7 g/dL (ref 30.0–36.0)
MCHC: 33.8 g/dL (ref 30.0–36.0)
MCV: 84.1 fL (ref 80.0–100.0)
MCV: 84.4 fL (ref 80.0–100.0)
Platelets: 140 10*3/uL — ABNORMAL LOW (ref 150–400)
Platelets: 163 10*3/uL (ref 150–400)
RBC: 3.21 MIL/uL — ABNORMAL LOW (ref 4.22–5.81)
RBC: 3.47 MIL/uL — ABNORMAL LOW (ref 4.22–5.81)
RDW: 15.9 % — ABNORMAL HIGH (ref 11.5–15.5)
RDW: 15.9 % — ABNORMAL HIGH (ref 11.5–15.5)
WBC: 12.2 10*3/uL — ABNORMAL HIGH (ref 4.0–10.5)
WBC: 14.2 10*3/uL — ABNORMAL HIGH (ref 4.0–10.5)
nRBC: 0.2 % (ref 0.0–0.2)
nRBC: 0.4 % — ABNORMAL HIGH (ref 0.0–0.2)

## 2023-12-13 LAB — BPAM RBC
Blood Product Expiration Date: 202501302359
Blood Product Expiration Date: 202502022359
Blood Product Expiration Date: 202502022359
ISSUE DATE / TIME: 202501041133
ISSUE DATE / TIME: 202501050927
ISSUE DATE / TIME: 202501070319
ISSUE DATE / TIME: 202501071340
ISSUE DATE / TIME: 202501112359
ISSUE DATE / TIME: 202501212359
Unit Type and Rh: 202501112359
Unit Type and Rh: 202501212359
Unit Type and Rh: 5100
Unit Type and Rh: 5100
Unit Type and Rh: 5100
Unit Type and Rh: 5100
Unit Type and Rh: 9500

## 2023-12-13 LAB — GLUCOSE, CAPILLARY
Glucose-Capillary: 247 mg/dL — ABNORMAL HIGH (ref 70–99)
Glucose-Capillary: 425 mg/dL — ABNORMAL HIGH (ref 70–99)
Glucose-Capillary: 382 mg/dL — ABNORMAL HIGH (ref 70–99)
Glucose-Capillary: 324 mg/dL — ABNORMAL HIGH (ref 70–99)
Glucose-Capillary: 316 mg/dL — ABNORMAL HIGH (ref 70–99)

## 2023-12-13 LAB — RENAL FUNCTION PANEL
Albumin: 2.3 g/dL — ABNORMAL LOW (ref 3.5–5.0)
Anion gap: 8 (ref 5–15)
BUN: 65 mg/dL — ABNORMAL HIGH (ref 8–23)
CO2: 27 mmol/L (ref 22–32)
Calcium: 7.8 mg/dL — ABNORMAL LOW (ref 8.9–10.3)
Chloride: 97 mmol/L — ABNORMAL LOW (ref 98–111)
Creatinine, Ser: 1.86 mg/dL — ABNORMAL HIGH (ref 0.61–1.24)
GFR, Estimated: 36 mL/min — ABNORMAL LOW (ref >60)
Glucose, Bld: 248 mg/dL — ABNORMAL HIGH (ref 70–99)
Phosphorus: 3.8 mg/dL (ref 2.5–4.6)
Potassium: 4 mmol/L (ref 3.5–5.1)
Sodium: 132 mmol/L — ABNORMAL LOW (ref 135–145)

## 2023-12-13 MED ORDER — INSULIN GLARGINE-YFGN 100 UNIT/ML ~~LOC~~ SOLN
14.0000 [IU] | Freq: Every day | SUBCUTANEOUS | Status: DC
  Administered 2023-12-13: 14 [IU] via SUBCUTANEOUS
  Filled 2023-12-13: qty 0.14

## 2023-12-13 MED ORDER — INSULIN ASPART 100 UNIT/ML IJ SOLN
25.0000 [IU] | Freq: Once | INTRAMUSCULAR | Status: AC
  Administered 2023-12-13: 25 [IU] via SUBCUTANEOUS

## 2023-12-13 MED ORDER — INSULIN GLARGINE-YFGN 100 UNIT/ML ~~LOC~~ SOLN
20.0000 [IU] | Freq: Every day | SUBCUTANEOUS | Status: DC
  Administered 2023-12-14 – 2023-12-18 (×5): 20 [IU] via SUBCUTANEOUS
  Filled 2023-12-13 (×5): qty 0.2

## 2023-12-13 MED ORDER — FUROSEMIDE 10 MG/ML IJ SOLN
80.0000 mg | Freq: Once | INTRAMUSCULAR | Status: AC
  Administered 2023-12-13: 80 mg via INTRAVENOUS
  Filled 2023-12-13: qty 8

## 2023-12-13 MED ORDER — INSULIN GLARGINE-YFGN 100 UNIT/ML ~~LOC~~ SOLN
6.0000 [IU] | Freq: Once | SUBCUTANEOUS | Status: AC
  Administered 2023-12-13: 6 [IU] via SUBCUTANEOUS
  Filled 2023-12-13: qty 0.06

## 2023-12-13 MED ORDER — HYDROCODONE BIT-HOMATROP MBR 5-1.5 MG/5ML PO SOLN
5.0000 mL | Freq: Four times a day (QID) | ORAL | Status: DC | PRN
  Administered 2023-12-13 – 2023-12-25 (×17): 5 mL via ORAL
  Filled 2023-12-13 (×17): qty 5

## 2023-12-13 MED ORDER — VITAMIN B-12 1000 MCG PO TABS
1000.0000 ug | ORAL_TABLET | Freq: Every day | ORAL | Status: DC
  Administered 2023-12-13 – 2023-12-30 (×18): 1000 ug via ORAL
  Filled 2023-12-13 (×18): qty 1

## 2023-12-13 MED ORDER — INSULIN ASPART 100 UNIT/ML IJ SOLN
3.0000 [IU] | Freq: Three times a day (TID) | INTRAMUSCULAR | Status: DC
  Administered 2023-12-13 – 2023-12-22 (×25): 3 [IU] via SUBCUTANEOUS

## 2023-12-13 NOTE — Progress Notes (Signed)
 Inpatient Rehab Admissions Coordinator:   Per therapy recommendations, patient was screened for CIR candidacy by [name] Kleine, MS, CCC-SLP. At this time, Pt. does not appear to demonstrate sufficient medical complexity to obtain insurance auth for CIR from [facility]. Additionally, OT recommends no post acute follow up. I will not pursue a rehab consult for this Pt.   Recommend other rehab venues to be pursued.  Please contact me with any questions.  [name] Kleine, MS, CCC-SLP Rehab Admissions Coordinator  [phone] (celll) [phone] (office)

## 2023-12-13 NOTE — Progress Notes (Signed)
 Nutrition Follow-up  DOCUMENTATION CODES:   Not applicable  INTERVENTION:  Encourage adequate PO intake Continue protein containing snacks TID between meals  Nepro Shake po BID, each supplement provides 425 kcal and 19 grams protein Renal MVI with minerals daily Zinc  220mg  daily x14 days d/t low levels; recheck lab following repletion Recommend Vitamin B12 supplementation as vitamin B12 typically results significantly elevated in acutely ill populations; MD ordered  NUTRITION DIAGNOSIS:   Inadequate oral intake related to chronic illness (CAD, AS, CKD, DM) as evidenced by per patient/family report. - progressing  GOAL:   Patient will meet greater than or equal to 90% of their needs - progressing, addressing via meals and nutrition supplements  MONITOR:   PO intake, Supplement acceptance, Labs, Weight trends, Diet advancement, I & O's, Skin  REASON FOR ASSESSMENT:   Consult Assessment of nutrition requirement/status (CRRT protocol)  ASSESSMENT:   Pt admitted with AKI and hypotension, found to be COVID+ on admission. 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  01/01: admitted 01/02: CRRT initiated 01/03: CRRT held 01/04: s/p CT abd/pelvis- large retroperitoneal hematoma; CRRT restarted  01/06: CRRT stopped 01/07: repeat CT retroperitoneal hematoma measuring slightly larger compared to prior exam  Per Nephrology, no acute indication for HD. Plans to remove non-tunneled HD cath.  IR continues to follow for retroperitoneal bleed, d/t improving symptoms and to preserve renal function no indication for intervention at this time.   No meal completions documented on file.  Spoke with RN who reports pt is eating really well. He consumed at least 75% of breakfast this morning. Holding snacks d/t significantly elevated blood sugars. Insulin  regimen has been adjusted.  Pt also continued receiving and consuming Nepro shakes.    Admit weight: 102 kg Last measured weight (1/6): 108.4 kg Highest weight during admission being 109.4 kg.  Medications: Vitamin B12 1000mcg daily, decadron , colace, SSI 0-20 units TID, SSI 0-5 units at bedtime, SSI 3 units TID, semglee  20 units daily, rena-vit, protonix   Labs:  Sodium 132 BUN 62 Cr 1.43 GFR 49 CBG's 247-425 x24 hours  Vitamin/micronutrient labs: CRP 9.0 (H) Copper  108 Vitamin A  38.9 Vitamin B12 300- would recommend supplementation as vitamin B12 typically results significantly elevated in acutely ill populations Vitamin C  0.7 Zinc  41 (L)  [name]: 2.8L since admit I/O's: -1.2L since admit  Diet Order:   Diet Order             Diet Carb Modified Fluid consistency: Thin  Diet effective now                   EDUCATION NEEDS:   Education needs have been addressed  Skin:  Skin Assessment: Skin Integrity Issues: Skin Integrity Issues:: Other (Comment) Other: L foot non-pressure wound 2.5cm x 1.5 cm  Last BM:  1/8 type 5 medium  Height:   Ht Readings from Last 1 Encounters:  12/06/23 6' (1.829 m)    Weight:   Wt Readings from Last 1 Encounters:  12/11/23 108.4 kg    Ideal Body Weight:  80.9 kg  BMI:  Body mass index is 32.41 kg/m.  Estimated Nutritional Needs:   Kcal:  2000-2200  Protein:  130-145g  Fluid:  1L + [name]  Royce Maris, RDN, LDN Clinical Nutrition

## 2023-12-13 NOTE — Plan of Care (Signed)
  Problem: Education: Goal: Knowledge of risk factors and measures for prevention of condition will improve Outcome: Progressing   Problem: Coping: Goal: Psychosocial and spiritual needs will be supported Outcome: Progressing   Problem: Respiratory: Goal: Will maintain a patent airway Outcome: Progressing Goal: Complications related to the disease process, condition or treatment will be avoided or minimized Outcome: Progressing   Problem: Education: Goal: Ability to describe self-care measures that may prevent or decrease complications (Diabetes Survival Skills Education) will improve Outcome: Progressing Goal: Individualized Educational Video(s) Outcome: Progressing   Problem: Coping: Goal: Ability to adjust to condition or change in health will improve Outcome: Progressing   Problem: Fluid Volume: Goal: Ability to maintain a balanced intake and output will improve Outcome: Progressing   Problem: Health Behavior/Discharge Planning: Goal: Ability to identify and utilize available resources and services will improve Outcome: Progressing Goal: Ability to manage health-related needs will improve Outcome: Progressing   Problem: Nutritional: Goal: Maintenance of adequate nutrition will improve Outcome: Progressing Goal: Progress toward achieving an optimal weight will improve Outcome: Progressing   Problem: Tissue Perfusion: Goal: Adequacy of tissue perfusion will improve Outcome: Progressing   Problem: Skin Integrity: Goal: Risk for impaired skin integrity will decrease Outcome: Progressing

## 2023-12-13 NOTE — Progress Notes (Signed)
 [facility] Progress Note   Assessment/ Plan:    1.  Acute kidney injury: Appears to be hemodynamically mediated possibly from a combination of CHF exacerbation +/- sepsis with evolution to ATN.   - CRRT 1/2-1/3.  Then CRRT was paused but restarted 12/09/23 in setting of RP bleed.  Stopped CRRT on 1/6 AM.   - No acute indication for dialysis.  Assess dialysis needs daily.   - continue nontunneled catheter for now but anticipate we can hopefully remove on 1/9 - remove foley catheter - diuretics as below   2.  Hyperkalemia:  - resolved - changed ensure to nepro   - diet education provided to pt and wife  3.  Anion gap metabolic acidosis:  - improved s/p CRRT   4.  Shock - resolved: Appears to be largely cardiogenic but cannot rule out septic component with presentation.  He was started on antibiotic coverage for community-acquired pneumonia and has covid. - s/p abx per primary team   5. Acute HF with reduced EF - s/p CRRT - watching for dialysis needs daily - lasix  intermittently - will give lasix  80 mg IV once today    6. Covid 19 PNA - Therapies primary team  - optimizing volume status as above   7.  Acute hypoxic respiratory failure: Appears to be from a combination of CHF exacerbation +/- underlying interstitial lung disease and now community-acquired pneumonia and covid. - optimize volume with diuretics   8.  Normocytic Anemia:  - secondary to RP bleed and critical illness.   - PRBC's per primary team.   - Note pt with NSTEMI which was felt to be supply/demand ischemia in setting of covid with underlying cardiomyopathy, CAD per cardiology.  Defer ESA for now  9. RP Bleed:  - per PCCM  - PRBC's per primary team   Disposition - continue inpatient monitoring     Subjective:    He had minimal UF on 1/6 before CRRT was stopped (30 mL).  He had 2.8 liters [name] over 1/7.  Feels better.  Still wearing his CPAP from overnight.    Review of systems:   He  denies shortness of breath - states that his breathing is doing better Denies chest pain Denies n/v States has had a foley. Normally doesn't have trouble urinating; usually good stream     Objective:   BP 128/62   Pulse 61   Temp 97.7 F (36.5 C) (Oral)   Resp 14   Ht 6' (1.829 m)   Wt 108.4 kg   SpO2 99%   BMI 32.41 kg/m   Intake/Output Summary (Last 24 hours) at 12/13/2023 0730 Last data filed at 12/13/2023 0400 Gross per 24 hour  Intake 1230.67 ml  Output 2790 ml  Net -1559.33 ml   Weight change:   Physical Exam:    General elderly male in bed in no acute distress HEENT normocephalic atraumatic extraocular movements intact sclera anicteric Neck supple trachea midline Lungs clear to auscultation bilaterally normal work of breathing at rest on CPAP nasal mask from overnight; also getting 4 liters oxygen  Heart S1S2 no rub Abdomen soft nontender nondistended Extremities 1+ edema lower extremities; right BKA Psych normal mood and affect Neuro alert and oriented x 3 provides hx and follows commands GU foley catheter Access: RIJ non-tunneled HD catheter  Imaging: CT ABDOMEN PELVIS WO CONTRAST Result Date: 12/12/2023 CLINICAL DATA:  Follow-up retroperitoneal bleed EXAM: CT ABDOMEN AND PELVIS WITHOUT CONTRAST TECHNIQUE: Multidetector CT imaging of the  abdomen and pelvis was performed following the standard protocol without IV contrast. RADIATION DOSE REDUCTION: This exam was performed according to the departmental dose-optimization program which includes automated exposure control, adjustment of the mA and/or kV according to patient size and/or use of iterative reconstruction technique. COMPARISON:  CT 12/09/2023 FINDINGS: Lower chest: Lung bases demonstrate increased bilateral pleural effusions. Cardiomegaly with partially visualized valve prosthesis. 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 Hazy edema or infiltrates at the bases. Hepatobiliary: No focal liver abnormality is seen.  No gallstones, gallbladder wall thickening, or biliary dilatation. Pancreas: Unremarkable. No pancreatic ductal dilatation or surrounding inflammatory changes. Spleen: Normal in size without focal abnormality. Adrenals/Urinary Tract: Adrenal glands are normal. Kidneys show no hydronephrosis. Delayed bilateral nephrogram. Urinary bladder is decompressed by Foley catheter and contains small amount of air. Slightly thick-walled bladder with mild perivesical stranding Stomach/Bowel: Stomach nonenlarged. No dilated small bowel. No acute bowel wall thickening. Diverticular disease of the left colon. Negative appendix. Vascular/Lymphatic: Moderate aortic atherosclerosis. No aneurysm. No suspicious lymph nodes Reproductive: Negative prostate Other: No pelvic effusion or free air. Redemonstrated right retroperitoneal hematoma with hematocrit level. Hematoma measures slightly larger compared to the prior exam, reference series 3, image 46 with measurements of 8.9 x 6.3 compared with 7.4 x 6.2 cm previously. Hematoma measures about 16.8 cm craniocaudad on sagittal series 7, image 49, previously 14.7 cm. Small volume hemorrhage tracking inferiorly along the right psoas. Generalized subcutaneous edema. Musculoskeletal: No acute osseous abnormality. Chronic bilateral pars defect at L5 IMPRESSION: 1. Redemonstrated large right retroperitoneal hematoma with hematocrit level. Hematoma measures slightly larger compared to the prior exam. 2. Increased bilateral pleural effusions with hazy edema or infiltrates at the lung bases. Cardiomegaly. 3. Delayed bilateral nephrogram suggesting renal dysfunction. Correlate with appropriate laboratory values. Urinary bladder is decompressed by Foley catheter. Slightly thick-walled bladder with mild perivesical stranding, correlate for cystitis. 4. Aortic atherosclerosis. Aortic Atherosclerosis (ICD10-I70.0). Electronically Signed   By: Luke Bun M.D.   On: 12/12/2023 20:42      Labs: BMET Recent Labs  Lab 12/09/23 0348 12/09/23 0929 12/09/23 1606 12/10/23 0416 12/10/23 1607 12/11/23 0307 12/12/23 0339 12/13/23 0452  NA 133* 132* 133* 134* 132* 132* 130* 132*  K 4.9 5.7* 4.8 4.4 4.2 4.1 3.7 4.0  CL 99 97* 98 100 97* 98 95* 97*  CO2 25 25 25 25 25 25 25 27   GLUCOSE 186* 215* 175* 118* 126* 149* 216* 248*  BUN 56* 62* 63* 43* 37* 32* 55* 65*  CREATININE 1.61* 1.92* 1.86* 1.58* 1.65* 1.64* 2.22* 1.86*  CALCIUM  8.2* 8.1* 8.0* 7.6* 7.7* 7.6* 7.7* 7.8*  PHOS 4.7*  --  6.0* 4.5 4.8* 3.9 4.4 3.8   CBC Recent Labs  Lab 12/06/23 1840 12/06/23 2311 12/11/23 1745 12/12/23 0339 12/12/23 1801 12/13/23 0452  WBC 8.8   < > 17.8* 14.5* 13.9* 12.2*  NEUTROABS 6.7  --   --   --   --   --   HGB 9.5*   < > 7.5* 7.0* 9.5* 9.1*  HCT 30.1*   < > 22.9* 21.3* 29.0* 27.0*  MCV 86.2   < > 85.4 84.9 84.5 84.1  PLT 192   < > 157 142* 137* 140*   < > = values in this interval not displayed.    Medications:     atorvastatin   80 mg Oral Daily   Chlorhexidine  Gluconate Cloth  6 each Topical Daily   dexamethasone   6 mg Oral QHS   docusate sodium   100  mg Oral BID   escitalopram   10 mg Oral Daily   feeding supplement (NEPRO CARB STEADY)  237 mL Oral BID BM   gabapentin   300 mg Oral Daily   insulin  aspart  0-20 Units Subcutaneous TID WC   insulin  aspart  0-5 Units Subcutaneous QHS   insulin  glargine-yfgn  10 Units Subcutaneous Daily   multivitamin  1 tablet Oral QHS   pantoprazole   40 mg Oral Daily    Katheryn JAYSON Saba, MD 12/13/2023, 7:49 AM

## 2023-12-13 NOTE — Inpatient Diabetes Management (Signed)
 Inpatient Diabetes Program Recommendations  AACE/ADA: New Consensus Statement on Inpatient Glycemic Control (2015)  Target Ranges:  Prepandial:   less than 140 mg/dL      Peak postprandial:   less than 180 mg/dL (1-2 hours)      Critically ill patients:  140 - 180 mg/dL   Lab Results  Component Value Date   GLUCAP 425 (H) 12/13/2023   HGBA1C 8.5 (H) 12/06/2023    Review of Glycemic Control  Latest Reference Range & Units 12/12/23 11:37 12/12/23 16:11 12/12/23 21:31 12/13/23 08:02 12/13/23 11:47  Glucose-Capillary 70 - 99 mg/dL 802 (H) 695 (H) 690 (H) 247 (H) 425 (H)   Diabetes history: DM 2 Outpatient Diabetes medications:  Humalog  10-15 units tid with meals Tresiba  20 units daily Current orders for Inpatient glycemic control:  Semglee  14 units daily Decadron  6 mg daily Novolog  0-20 units tid with meals and HS  Inpatient Diabetes Program Recommendations:    Please consider adding Novolog  3 units tid with meals (hold if patient eats less than 50% or NPO).   Thanks,  Randall Bullocks, RN, BC-ADM Inpatient Diabetes Coordinator Pager [phone]  (8a-5p)

## 2023-12-13 NOTE — Progress Notes (Addendum)
 Referring Provider(s): Dr [name]   Supervising Physician: [name]  Patient Status:  [facility] - In-pt  Chief Complaint:  Retroperitoneal hemorrhage  Brief History:  Shane [name] is a 83 y.o. male admitted to [facility] via the ED for primary complaint 12/06/23 of SOB.   Comorbid conditions include PVD, CAD, DM, ILD, atrial fibrillation (home eliquis ), AS (post TAVR, [facility]), right BKA, left TMA, sick sinus syndrome, OSA, hx heart failure, CKD.     Admission diagnosis of COVID-19 and NSTEMI, with peak Trop elevated >8K, and BNP 480.   He was started on heparin , and plavix  continued.  Question of triple therapy that would include ASA.    The patient tells me that he has some back/abdominal pain all the time, though is sure that this pain worsened in the past 24 hours.  He localizes this to his right lower abdomen.  He is very uncomfortable.    CT done 12/09/23 showed a right RP hemorrhage, originating within the right psoas muscle.  Patient was evaluated by Dr. Alona on 12/09/23. His note reads = I had a discussion with Shane [name] regarding the natural history of retroperitoneal hemorrhage in this scenario, associated with AC/anti-platelet therapy.  Once stopped, these are contained bleeds in a high pressure/high resistance space, and typically will cease.  Treatment is typically non-surgical, medical support, and potentially angiogram and possible embolization if does not stop by itself.  I also discussed the risks of angiogram for him, which are elevated because of the location (multiple segmental/lumbar arteries would need to be evaluated, as well as formal aortogram), and the likely need for high contrast dose, which would put him at risk for CIN.  His renal function is just recovering from his admission.    VIR would advise continued medical support and observe, and attempt to withhold angiogram to protect kidneys.    We are following up on him today to ensure no intervention is  necessary.   Subjective:  Doing better. Sitting on side of bed. States the pain in his right side is much better.  Allergies: Codeine, Ofev  [nintedanib ], and Pirfenidone   Medications: Prior to Admission medications   Medication Sig Start Date End Date Taking? Authorizing Provider  albuterol  (PROVENTIL ) (2.5 MG/3ML) 0.083% nebulizer solution Take 2.5 mg by nebulization 2 (two) times daily as needed for wheezing or shortness of breath.   Yes [provider]  albuterol  (VENTOLIN  HFA) 108 (90 Base) MCG/ACT inhaler Inhale 2 puffs into the lungs every 4 (four) hours as needed for wheezing or shortness of breath. Patient taking differently: Inhale 3 puffs into the lungs every 4 (four) hours as needed for wheezing or shortness of breath. 04/12/21  Yes Angiulli, Toribio PARAS, PA-C  apixaban  (ELIQUIS ) 5 MG TABS tablet Take 1 tablet (5 mg total) by mouth 2 (two) times daily. Patient taking differently: Take 2.5 mg by mouth 2 (two) times daily. 04/12/21  Yes Angiulli, Toribio PARAS, PA-C  Ascorbic Acid  (VITAMIN C  WITH ROSE HIPS) 500 MG tablet Take 1 tablet (500 mg total) by mouth daily. 04/12/21  Yes Angiulli, Toribio PARAS, PA-C  atorvastatin  (LIPITOR ) 80 MG tablet TAKE 1 TABLET BY MOUTH EVERY DAY Patient taking differently: Take 80 mg by mouth at bedtime. 07/25/22  Yes Pietro Redell RAMAN, MD  clopidogrel  (PLAVIX ) 75 MG tablet Take 75 mg by mouth daily. 09/04/22  Yes [provider]  diphenhydrAMINE  HCl, Sleep, (ZZZQUIL PO) Take 15 mLs by mouth at bedtime as needed (cough).   Yes  [provider]  docusate sodium  (COLACE) 100 MG capsule Take 1 capsule (100 mg total) by mouth daily. Patient taking differently: Take 100 mg by mouth as needed for mild constipation or moderate constipation. 04/13/21  Yes Angiulli, Toribio PARAS, PA-C  escitalopram  (LEXAPRO ) 10 MG tablet Take 1 tablet (10 mg total) by mouth daily. 04/12/21  Yes Angiulli, Toribio PARAS, PA-C  FARXIGA  10 MG TABS tablet Take 10 mg by mouth daily.   Yes  [provider]  furosemide  (LASIX ) 20 MG tablet Take 3 tablets (60 mg total) by mouth daily. Patient taking differently: Take 20-40 mg by mouth See admin instructions. Take 2 tablets by mouth in the morning and 1 tablet at night 06/24/23 12/05/24 Yes Ezenduka, Nkeiruka J, MD  HUMALOG  KWIKPEN 100 UNIT/ML KwikPen Inject 10-15 Units into the skin 3 (three) times daily. Depending what his blood sugar reading is   Yes [provider]  HYDROMET 5-1.5 MG/5ML syrup Take 5 mLs by mouth as needed for cough. 05/29/23  Yes [provider]  lisinopril  (ZESTRIL ) 10 MG tablet Take 10 mg by mouth daily. 08/22/23  Yes [provider]  LORazepam  (ATIVAN ) 1 MG tablet Take 1 tablet (1 mg total) by mouth at bedtime. Patient taking differently: Take 1 mg by mouth at bedtime as needed (sleep). 04/12/21  Yes Angiulli, Toribio PARAS, PA-C  metoprolol  tartrate (LOPRESSOR ) 25 MG tablet Take 25 mg by mouth daily.   Yes [provider]  Multiple Vitamin (MULTIVITAMIN WITH MINERALS) TABS tablet Take 1 tablet by mouth daily.   Yes [provider]  mupirocin  ointment (BACTROBAN ) 2 % Apply 1 Application topically at bedtime. 06/06/23  Yes [provider]  nitroGLYCERIN  (NITRODUR - DOSED IN MG/24 HR) 0.2 mg/hr patch Place 1 patch (0.2 mg total) onto the skin daily. 11/23/23  Yes Harden Jerona GAILS, MD  Olopatadine HCl (PATADAY OP) Place 2 drops into both eyes daily as needed (allergies).   Yes [provider]  ondansetron  (ZOFRAN ) 4 MG tablet Take 4 mg by mouth as needed for nausea or vomiting. 06/15/23  Yes [provider]  pantoprazole  (PROTONIX ) 40 MG tablet Take 1 tablet (40 mg total) by mouth 2 (two) times daily. Patient taking differently: Take 40 mg by mouth daily. 04/12/21  Yes Angiulli, Daniel J, PA-C  polyethylene glycol powder (GLYCOLAX /MIRALAX ) 17 GM/SCOOP powder Take 17 g by mouth every other day. 11/15/20  Yes [provider]  traZODone  (DESYREL ) 50  MG tablet Take 75 mg by mouth at bedtime as needed for sleep. 05/11/23  Yes [provider]  TRESIBA  FLEXTOUCH 100 UNIT/ML FlexTouch Pen Inject 20 Units into the skin daily. 12/22/22  Yes [provider]  zinc  sulfate 220 (50 Zn) MG capsule Take 1 capsule (220 mg total) by mouth daily. 04/12/21  Yes Angiulli, Toribio PARAS, PA-C  gabapentin  (NEURONTIN ) 300 MG capsule TAKE 1 CAPSULE BY MOUTH THREE TIMES A DAY Patient taking differently: Take 300 mg by mouth 2 (two) times daily. 12/14/21   Valdemar Rocky SAUNDERS, NP     Vital Signs: BP 128/62   Pulse 61   Temp 97.7 F (36.5 C) (Oral)   Resp 14   Ht 6' (1.829 m)   Wt 238 lb 15.7 oz (108.4 kg)   SpO2 99%   BMI 32.41 kg/m   Physical Exam Constitutional:      Appearance: Normal appearance.  HENT:     Head: Normocephalic and atraumatic.  Cardiovascular:     Rate and Rhythm: Normal  rate.  Pulmonary:     Effort: Pulmonary effort is normal. No respiratory distress.  Abdominal:     Palpations: Abdomen is soft.     Tenderness: There is no abdominal tenderness.  Musculoskeletal:     Comments: Ecchymosis of the right side/flank  Skin:    General: Skin is warm and dry.  Neurological:     General: No focal deficit present.     Mental Status: He is alert and oriented to person, place, and time.  Psychiatric:        Mood and Affect: Mood normal.        Behavior: Behavior normal.        Thought Content: Thought content normal.        Judgment: Judgment normal.      Labs:  CBC: Recent Labs    12/11/23 1745 12/12/23 0339 12/12/23 1801 12/13/23 0452  WBC 17.8* 14.5* 13.9* 12.2*  HGB 7.5* 7.0* 9.5* 9.1*  HCT 22.9* 21.3* 29.0* 27.0*  PLT 157 142* 137* 140*    COAGS: Recent Labs    12/06/23 1840 12/07/23 0405 12/08/23 0901 12/08/23 1609 12/09/23 0348 12/09/23 0929 12/12/23 1055  INR 1.6*  --   --   --   --  1.6* 1.2  APTT 37*   < > 94* 86* 114* 35  --    < > = values in this interval not displayed.    BMP: Recent  Labs    12/10/23 1607 12/11/23 0307 12/12/23 0339 12/13/23 0452  NA 132* 132* 130* 132*  K 4.2 4.1 3.7 4.0  CL 97* 98 95* 97*  CO2 25 25 25 27   GLUCOSE 126* 149* 216* 248*  BUN 37* 32* 55* 65*  CALCIUM  7.7* 7.6* 7.7* 7.8*  CREATININE 1.65* 1.64* 2.22* 1.86*  GFRNONAA 41* 42* 29* 36*    LIVER FUNCTION TESTS: Recent Labs    06/22/23 0232 06/22/23 2341 12/06/23 1840 12/07/23 0500 12/09/23 0929 12/09/23 1606 12/10/23 0416 12/10/23 1607 12/11/23 0307 12/12/23 0339 12/13/23 0452  BILITOT 0.9  --  0.6  --  0.6  --  0.9  --   --   --   --   AST 17  --  45*  --  64*  --  53*  --   --   --   --   ALT 18  --  16  --  27  --  26  --   --   --   --   ALKPHOS 98  --  85  --  65  --  64  --   --   --   --   PROT 6.3*  --  6.8  --  5.8*  --  5.5*  --   --   --   --   ALBUMIN  2.8*   < > 2.9*   < > 2.6*   < > 2.5* 2.6* 2.5* 2.4* 2.3*   < > = values in this interval not displayed.    Assessment and Plan:  Retroperitoneal bleed = Hgb stable. Patient's symptoms have improved.  MAR checked - Anticoagulation held.  Creatinine continues to improve - ideally would like to avoid contrasted procedure to preserve renal function, however if he were to develop life threatening bleeding, he understands we would risk his renal function for a life saving procedure.  He is in agreement to proceed IF angiography with embolization was needed.  Electronically Signed: SARI GORMAN LAMP, PA-C 12/13/2023, 9:56 AM  I spent a total of 15 Minutes at the the patient's bedside AND on the patient's hospital floor or unit, greater than 50% of which was counseling/coordinating care for f/u RP bleed

## 2023-12-13 NOTE — Progress Notes (Signed)
 Physical Therapy Treatment Patient Details Name: Shane [name] MRN: [id number] DOB: 1941/03/26 Today's Date: 12/13/2023   History of Present Illness 83 year old male presented to ED 1/1 with shortness of breath.  Dx: Covid 19 PNA, Acute kidney injury, Acute hypoxic respiratory failure, Normocytic Anemia - secondary to RP bleed and critical illness, Acute HF with reduced EF, s/p CRRT. With significant past medical history including obstructive sleep apnea on CPAP at night, CAD s/p CABG x3 in 2021, aortic stenosis s/p TAVR 08/2023 at [facility], HFpEF, atrial fibrillation on Eliquis , plavix , sick sinus syndrome s/p leadless PPM, hypertension, hyperlipidemia, GERD, uncontrolled diabetes, CKD 3, pulmonary fibrosis    PT Comments  Pt remains limited by LE edema preventing fit of LLE prosthetic. PT assists pt in multiple transfer attempts, unable to stand with erect posture or able to don prosthetic. Pt remains much weaker than baseline at this time. PT recommends rehab consult in an effort to improve mobility quality and to reduce falls risk. Patient will benefit from intensive inpatient follow up therapy, >3 hours/day.    If plan is discharge home, recommend the following: A lot of help with walking and/or transfers;A lot of help with bathing/dressing/bathroom;Assistance with cooking/housework;Assist for transportation;Help with stairs or ramp for entrance   Can travel by private vehicle        Equipment Recommendations  Deitra lift    Recommendations for Other Services Rehab consult     Precautions / Restrictions Precautions Precautions: Fall Precaution Comments: Lt disolateral foot wound. Prior Rt BKA. Wears 2 L O2 at night Required Braces or Orthoses: Other Brace Other Brace: RLE prosthesis, unable to don due to edema, pt has shrinker in room and has been utilizing Restrictions Weight Bearing Restrictions Per Provider Order: No     Mobility  Bed Mobility Overal bed mobility: Needs  Assistance Bed Mobility: Sit to Supine       Sit to supine: Min assist   General bed mobility comments: assist for LE management, modA x 2 to slide up toward [facility]    Transfers Overall transfer level: Needs assistance Equipment used: Rolling walker (2 wheels), 1 person hand held assist Transfers: Sit to/from Stand Sit to Stand: Max assist, From elevated surface           General transfer comment: pt attempts sit to stand initially with RW in an effort to see if prosthetic pin would lock in with increased pressure, unable to clear buttocks from bed or get prosthetic to fit. PT then assist pt in partial sit to stand with face to face assist and L knee block, maxA to clear but from bed but still with incomplete hip extension and no locking of prosthetic    Ambulation/Gait                   Stairs             Wheelchair Mobility     Tilt Bed    Modified Rankin (Stroke Patients Only)       Balance Overall balance assessment: Needs assistance Sitting-balance support: No upper extremity supported, Feet supported Sitting balance-Leahy Scale: Good     Standing balance support: Bilateral upper extremity supported, Reliant on assistive device for balance Standing balance-Leahy Scale: Zero Standing balance comment: maxA for partial stand                            Cognition Arousal: Alert Behavior During Therapy: [facility] for tasks assessed/performed  Overall Cognitive Status: Within Functional Limits for tasks assessed                                          Exercises Other Exercises Other Exercises: PT encourages attempts at bridging through RLE, press ups at edge of bed, long arc quads, glute sets    General Comments General comments (skin integrity, edema, etc.): pt on 2L [place] initially during session, pt sats 90% and above when with reliable pleth reading during mobility however pt reports DOE and requests increase in supplemental  oxygen to 3L [place]. Pt sats 98-100% on 3L [place], RN made aware      Pertinent Vitals/Pain Pain Assessment Pain Assessment: No/denies pain    Home Living                          Prior Function            PT Goals (current goals can now be found in the care plan section) Acute Rehab PT Goals Patient Stated Goal: Get well, go home Progress towards PT goals: Not progressing toward goals - comment (remains limited due to weakness and LLE edema preventing fit of prosthetic)    Frequency    Min 1X/week      PT Plan      Co-evaluation              AM-PAC PT 6 Clicks Mobility   Outcome Measure  Help needed turning from your back to your side while in a flat bed without using bedrails?: A Little Help needed moving from lying on your back to sitting on the side of a flat bed without using bedrails?: A Little Help needed moving to and from a bed to a chair (including a wheelchair)?: A Lot Help needed standing up from a chair using your arms (e.g., wheelchair or bedside chair)?: A Lot Help needed to walk in hospital room?: Total Help needed climbing 3-5 steps with a railing? : Total 6 Click Score: 12    End of Session Equipment Utilized During Treatment: Gait belt Activity Tolerance: Patient limited by fatigue Patient left: in bed;with call bell/phone within reach;with family/visitor present Nurse Communication: Mobility status;Need for lift equipment PT Visit Diagnosis: Muscle weakness (generalized) (M62.81);Difficulty in walking, not elsewhere classified (R26.2);Pain Pain - Right/Left: Right     Time: 8878-8855 PT Time Calculation (min) (ACUTE ONLY): 23 min  Charges:    $Therapeutic Activity: 23-37 mins PT General Charges $$ ACUTE PT VISIT: 1 Visit                     Bernardino JINNY Ruth, PT, DPT Acute Rehabilitation Office [phone]    Bernardino JINNY Ruth 12/13/2023, 12:01 PM

## 2023-12-13 NOTE — Progress Notes (Signed)
 PROGRESS NOTE    Shane [name]  FMW:[id number] DOB: 03-17-1941 DOA: 12/06/2023 PCP: Shepard Ade, MD    Chief Complaint  Patient presents with   Shortness of Breath    Brief Narrative: 83 year old male with significant past medical history including obstructive sleep apnea on CPAP at night, CAD s/p CABG x3 in 2021, aortic stenosis s/p TAVR 08/2023 at [facility], HFpEF, atrial fibrillation on Eliquis , plavix , sick sinus syndrome s/p leadless PPM, hypertension, hyperlipidemia, GERD, uncontrolled diabetes, CKD 3, pulmonary fibrosis followed by Dr. Geronimo, who presented to the emergency department on 12/06/2023 with shortness of breath.  Per the ED provider complaining of cough and shortness of breath over the past 3 days.  Using his nebulizers at home with no improvement in his symptoms.  EMS was called and found to be hypotensive and hypoxic.  He was started on nonrebreather and an epinephrine  infusion prior to arrival.  In the ED afebrile, SBP around 100, tachypneic.  Labs +COVID, AKI on CKD with BUN/Cr 116, 4.69 with critical K of 7.1. bicarb 16. CBC without leukocytosis, hgb 9.5. lactic 2.3>1.9, troponin 8,290, UA negative, blood cultures pending. CXR with likely pulmonary edema. With new AKI, hyperkalemia and EKG changes, nephrology was consulted. Patient given lokelma , albuterol , Lasix , sodium bicarb, 5U insulin  with D50, calcium  gluconate. Was also given azithromycin /rocephin  CAP coverage. Neprhology recommended albumin  for BP support (initially on 2mcg levo, now off), Lasix  80mg  IV, renal ultrasound.  Patient subsequently admitted to the ICU.     Assessment & Plan:   Principal Problem:   Acute kidney injury (nontraumatic) (HCC) Active Problems:   COVID-19 virus infection   PAD (peripheral artery disease) (HCC)   Atrial fibrillation (HCC)   Hyperkalemia   Acute on chronic congestive heart failure (HCC)   Non-ST elevation (NSTEMI) myocardial infarction [facility])   Retroperitoneal  bleeding   History of transcatheter aortic valve replacement (TAVR)   Chronic heart failure with preserved ejection fraction (HFpEF) (HCC)   Hx of CABG   Atherosclerosis of native coronary artery of native heart without angina pectoris   Acute on chronic hypoxic respiratory failure (HCC)   Anemia   Esophageal dysphagia   COVID-19   Type 2 diabetes mellitus without complication, with long-term current use of insulin  (HCC)   #1 acute on chronic hypoxic respiratory failure likely multifactorial secondary to acute COVID-19 infection, in the setting of pulmonary edema secondary to acute systolic CHF exacerbation in the setting of underlying pulmonary fibrosis -Patient noted to be on baseline O2 2 L nasal cannula at bedtime -Patient noted to have presented with cough, shortness of breath x 3 days with no improvement on home nebulizers. -EMS noted on presentation patient to be hypoxic. -Patient states wife and grandson had been sick with COVID infection recently. -MRSA PCR negative. -SARS coronavirus 2 PCR positive, influenza A and B by PCR negative. -Initial chest x-ray findings concerning for CHF exacerbation. -Patient initially received a dose of IV azithromycin  and Rocephin  x 1. -Patient status post 3-day course of remdesivir  and currently on Decadron . -Patient also received IV Lasix  with diuresis. -Patient also noted to have undergone CRRT during the hospitalization. -Patient currently on 4 L nasal cannula with sats of 100%. -Wean O2. -Hycodan as needed. -Patient with clinical improvement. -Continue IV Lasix .  2.  Non-STEMI -Patient on admission noted to have elevated troponins peaking at 8-9 0 and subsequently trending down. -Felt likely secondary to demand ischemia in the setting of COVID-19 infection with underlying cardiomyopathy CAD/history of CABG. -Patient denies  any chest pain. -2D echo obtained with EF of 40%,WMA, moderately dilated left atrial size, mildly dilated right  atrial size, status post bioprosthetic aortic valve/status post TAVR. -Patient seen in consultation by cardiology who discussed with patient and decision made to treat medically with IV heparin  for 48 hours and continue medical therapy. -IV heparin  discontinued due to spontaneous retroperitoneal bleed. -Per cardiology.  3.  Acute retroperitoneal bleed -Noted on CT abdomen and pelvis with contrast 12/09/2023. -Patient status post transfusion of 1 unit platelets, 2 units PRBCs, thiamine, DDAVP . -Patient noted to have started on aspirin  which has subsequently been discontinued. -Anticoagulation for thromboembolic prophylaxis given A-fib on hold due to risk of bleeding. -Hemoglobin currently at 9.1 from 7.0 after transfusion of 2 units PRBCs on 12/12/2023.   -Hemoglobin noted to have been 8.0 on 12/11/2023. -Due to presentation with non-STEMI and cardiac history patient was transfused 2 units PRBCs on 12/12/2023.  -Transfusion threshold hemoglobin < 8. -Repeat CT abdomen and pelvis (12/12/2023 ), with redemonstrated large right retroperitoneal hematoma with hematocrit level.  Hematoma measures slightly larger compared to prior exam.  Increased bilateral pleural effusions with hazy edema or infiltrates at the lung bases.  Cardiomegaly.   -IR following.   4.  Paroxysmal A-fib -Currently rate controlled. -Anticoagulation on hold secondary to retroperitoneal bleed as risk of bleeding outweighs benefits at this time and per cardiology recommendations. -Outpatient follow-up with primary cardiologist for possible evaluation for Watchman versus retrial per cardiology. -Per cardiology.  5.  History of complete heart block/SSS/status post pacemaker implantation -Stable. -Per cardiology.  6.  Hyperlipidemia -Statin.  7.  CAD status post CABG/status post TAVR -Patient noted to have last dose of Plavix  12/08/2023. -Patient was to be started on aspirin  12/11/2023 per cardiology recommendations however due to drop in  hemoglobin aspirin  discontinued. -Per cardiology.  8.  Acute kidney injury -Felt likely hemodynamically mediated from combination of CHF exacerbation, acute infection with evolution to ATN in the setting of ACE inhibitor -Patient being followed by nephrology and underwent CRRT 1/2-1/3. -CRRT paused but restarted 12/09/2023 in the setting of retroperitoneal bleed and stopped on 12/11/2023. -Patient with urine output of 2.790 L over the past 24 hours. -Creatinine currently at 1.86 today from 2.22 yesterday. -Continue to hold ACE inhibitor -Per nephrology.  9.  Hyperkalemia -Likely secondary to problem #8 -Resolved.  Potassium of 4.0. -Lokelma  discontinued. -Ensure changed to nephro per nephrology. -Per nephrology.  10.  Anion gap metabolic acidosis -Likely secondary to acute kidney injury. -Improved with CRRT. -Per nephrology.  11.  Shock -Not largely cardiogenic but cannot rule out a septic component.-Patien -Status post dose of antibiotic coverage for community-acquired pneumonia and status post treatment for COVID infection. -Shock resolved.  12.  Normocytic anemia/acute blood loss anemia -Likely secondary to retroperitoneal bleed and acute illness. -See above, retroperitoneal bleed.  13.  Dysphagia -Patient seen in consultation by GI, patient noted to have some intermittent dysphagia mostly to solids, dense bread and meat occasionally to liquids. -GI and patient deferring any procedures at this point and GI recommending patient will benefit from EGD for evaluation of possible esophageal dilatation based on his symptoms in the outpatient setting which will be arranged. -GI recommending consideration of obtaining barium esophagram prior to discharge to exclude any high risk lesions. -Barium esophagram ordered and pending.  14.  Diabetes mellitus type 2 -Hemoglobin A1c 8.5 (12/06/2023) -Patient noted to be on Farxiga , Tresiba  20 units daily, prior to admission. -Patient presenting  acute kidney injury, Farxiga  on hold. -  Patient also noted on Decadron . -CBG 247 this morning. -Increase Semglee  to 14 units daily, continue SSI, Neurontin .     DVT prophylaxis: SCDs Code Status: Full Family Communication: Updated patient and wife at bedside. Disposition: TBD  Status is: Inpatient Remains inpatient appropriate because: Severity of illness   Consultants:  Nephrology: Dr. Tobie 12/06/2023 Cardiology: Dr. Duffy 12/07/2023 Wound care RN General Surgery: Dr. Stevie 12/09/2023 Interventional radiology: Dr. Alona 12/09/2023 Gastroenterology: Dr. Rollin 12/09/2023 PCCM admission  Procedures:  CT abdomen and pelvis 12/09/2023, 12/12/2023 Chest x-ray 12/06/2023, 12/07/2023 Renal ultrasound 12/06/2023 2D echo 12/07/2023 Transfusion 1 unit platelets 12/09/2023 Transfusion 2 units PRBCs (1/4, 1/5) Transfused 2 units PRBCs 12/12/2023  Significant Hospital Events: Including procedures, antibiotic start and stop dates in addition to other pertinent events   1/1: Admit to CCM for covid pneumonia, aki with hyperK likely needing CRRT  1/2: CRRT initiated, Card Consult due to elev Troponin (Nstemi vs Demand Ischemia)  1/3: Continuing CRRT till this afternoon, PCCM sign off this afternoon, trx to [facility] tomorrow  1/4 RPB -- multipl PRBC. CCS and Ir consults. Medical mgmnt.  1/5 1 PRBC  Antimicrobials: Anti-infectives (From admission, onward)    Start     Dose/Rate Route Frequency Ordered Stop   12/07/23 1000  remdesivir  100 mg in sodium chloride  0.9 % 100 mL IVPB       Placed in Followed by Linked Group   100 mg 200 mL/hr over 30 Minutes Intravenous Daily 12/06/23 2239 12/08/23 0947   12/06/23 2245  remdesivir  200 mg in sodium chloride  0.9% 250 mL IVPB       Placed in Followed by Linked Group   200 mg 580 mL/hr over 30 Minutes Intravenous Once 12/06/23 2239 12/07/23 0142   12/06/23 1815  cefTRIAXone  (ROCEPHIN ) 2 g in sodium chloride  0.9 % 100 mL IVPB        2 g 200 mL/hr over 30 Minutes  Intravenous Once 12/06/23 1812 12/06/23 1908   12/06/23 1815  azithromycin  (ZITHROMAX ) 500 mg in sodium chloride  0.9 % 250 mL IVPB        500 mg 250 mL/hr over 60 Minutes Intravenous  Once 12/06/23 1812 12/06/23 2153         Subjective: Patient laying in bed, coughing.  States has some coughing and some shortness of breath with positional changes which is usually worse when he lays flat.  Overall feeling better than on admission.  Denies any overt bleeding.  Wife at bedside.    Objective: Vitals:   12/13/23 0800 12/13/23 0805 12/13/23 0900 12/13/23 1000  BP: 127/67  123/60 (!) 123/52  Pulse: 60  (!) 59 64  Resp: 12  14 (!) 23  Temp:  97.7 F (36.5 C)    TempSrc:  Oral    SpO2: 100%  100% 100%  Weight:      Height:        Intake/Output Summary (Last 24 hours) at 12/13/2023 1045 Last data filed at 12/13/2023 0400 Gross per 24 hour  Intake 1230.67 ml  Output 2540 ml  Net -1309.33 ml   Filed Weights   12/08/23 0500 12/10/23 0500 12/11/23 0500  Weight: 108.6 kg 107.9 kg 108.4 kg    Examination:  General exam: Appears calm and comfortable  Respiratory system: Diffuse crackles.  No wheezing.  No rhonchi.  Fair air movement.  On 4 L nasal cannula with sats of 100%.   Cardiovascular system: Regular rate rhythm no murmurs rubs or gallops.  Gastrointestinal system: Abdomen is soft,  nontender, nondistended, positive bowel sounds.  No rebound.  No guarding.  Central nervous system: Alert and oriented. No focal neurological deficits. 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  5/5 bilateral upper extremity strength. Skin: No rashes, lesions or ulcers Psychiatry: Judgement and insight appear normal. Mood & affect appropriate.     Data Reviewed: I have personally reviewed following labs and imaging studies  CBC: Recent Labs  Lab 12/06/23 1840 12/06/23 2311 12/11/23 0307 12/11/23 1745 12/12/23 0339 12/12/23 1801 12/13/23 0452  WBC 8.8    < > 16.6* 17.8* 14.5* 13.9* 12.2*  NEUTROABS 6.7  --   --   --   --   --   --   HGB 9.5*   < > 8.0* 7.5* 7.0* 9.5* 9.1*  HCT 30.1*   < > 24.4* 22.9* 21.3* 29.0* 27.0*  MCV 86.2   < > 85.3 85.4 84.9 84.5 84.1  PLT 192   < > 146* 157 142* 137* 140*   < > = values in this interval not displayed.    Basic Metabolic Panel: Recent Labs  Lab 12/09/23 0348 12/09/23 0929 12/10/23 0416 12/10/23 1607 12/11/23 0307 12/12/23 0339 12/13/23 0452  NA 133*   < > 134* 132* 132* 130* 132*  K 4.9   < > 4.4 4.2 4.1 3.7 4.0  CL 99   < > 100 97* 98 95* 97*  CO2 25   < > 25 25 25 25 27   GLUCOSE 186*   < > 118* 126* 149* 216* 248*  BUN 56*   < > 43* 37* 32* 55* 65*  CREATININE 1.61*   < > 1.58* 1.65* 1.64* 2.22* 1.86*  CALCIUM  8.2*   < > 7.6* 7.7* 7.6* 7.7* 7.8*  MG 2.3  --  2.5*  --  2.5* 2.7* 2.5*  PHOS 4.7*   < > 4.5 4.8* 3.9 4.4 3.8   < > = values in this interval not displayed.    GFR: Estimated Creatinine Clearance: 38.9 mL/min (A) (by C-G formula based on SCr of 1.86 mg/dL (H)).  Liver Function Tests: Recent Labs  Lab 12/06/23 1840 12/07/23 0500 12/09/23 0929 12/09/23 1606 12/10/23 0416 12/10/23 1607 12/11/23 0307 12/12/23 0339 12/13/23 0452  AST 45*  --  64*  --  53*  --   --   --   --   ALT 16  --  27  --  26  --   --   --   --   ALKPHOS 85  --  65  --  64  --   --   --   --   BILITOT 0.6  --  0.6  --  0.9  --   --   --   --   PROT 6.8  --  5.8*  --  5.5*  --   --   --   --   ALBUMIN  2.9*   < > 2.6*   < > 2.5* 2.6* 2.5* 2.4* 2.3*   < > = values in this interval not displayed.    CBG: Recent Labs  Lab 12/12/23 0732 12/12/23 1137 12/12/23 1611 12/12/23 2131 12/13/23 0802  GLUCAP 194* 197* 304* 309* 247*     Recent Results (from the past 240 hours)  Resp panel by RT-PCR (RSV, Flu A&B, Covid) Anterior Nasal Swab     Status: Abnormal   Collection Time: 12/06/23  6:12 PM   Specimen: Anterior Nasal Swab  Result Value Ref  Range Status   SARS Coronavirus 2 by RT PCR  POSITIVE (A) NEGATIVE Final   Influenza A by PCR NEGATIVE NEGATIVE Final   Influenza B by PCR NEGATIVE NEGATIVE Final    Comment: (NOTE) The Xpert Xpress SARS-CoV-2/FLU/RSV plus assay is intended as an aid in the diagnosis of influenza from Nasopharyngeal swab specimens and should not be used as a sole basis for treatment. Nasal washings and aspirates are unacceptable for Xpert Xpress SARS-CoV-2/FLU/RSV testing.  Fact Sheet for Patients: bloggercourse.com  Fact Sheet for Healthcare Providers: seriousbroker.it  This test is not yet approved or cleared by the [place]  FDA and has been authorized for detection and/or diagnosis of SARS-CoV-2 by FDA under an Emergency Use Authorization (EUA). This EUA will remain in effect (meaning this test can be used) for the duration of the COVID-19 declaration under Section 564(b)(1) of the Act, 21 U.S.C. section 360bbb-3(b)(1), unless the authorization is terminated or revoked.     Resp Syncytial Virus by PCR NEGATIVE NEGATIVE Final    Comment: (NOTE) Fact Sheet for Patients: bloggercourse.com  Fact Sheet for Healthcare Providers: seriousbroker.it  This test is not yet approved or cleared by the [place]  FDA and has been authorized for detection and/or diagnosis of SARS-CoV-2 by FDA under an Emergency Use Authorization (EUA). This EUA will remain in effect (meaning this test can be used) for the duration of the COVID-19 declaration under Section 564(b)(1) of the Act, 21 U.S.C. section 360bbb-3(b)(1), unless the authorization is terminated or revoked.  Performed at [facility], [address], [place], [place] [address]   Blood Culture (routine x 2)     Status: None   Collection Time: 12/06/23  6:30 PM   Specimen: BLOOD  Result Value Ref Range Status   Specimen Description BLOOD SITE NOT SPECIFIED  Final   Special  Requests   Final    BOTTLES DRAWN AEROBIC AND ANAEROBIC Blood Culture results may not be optimal due to an inadequate volume of blood received in culture bottles   Culture   Final    NO GROWTH 5 DAYS Performed at [facility], [address], [place], [place] [address]    Report Status 12/11/2023 FINAL  Final  Blood Culture (routine x 2)     Status: None   Collection Time: 12/06/23  6:40 PM   Specimen: BLOOD  Result Value Ref Range Status   Specimen Description BLOOD SITE NOT SPECIFIED  Final   Special Requests   Final    BOTTLES DRAWN AEROBIC AND ANAEROBIC Blood Culture results may not be optimal due to an inadequate volume of blood received in culture bottles   Culture   Final    NO GROWTH 5 DAYS Performed at [facility], [address] [place], [place] [address]    Report Status 12/11/2023 FINAL  Final  MRSA Next Gen by PCR, Nasal     Status: None   Collection Time: 12/06/23 11:50 PM   Specimen: Nasal Mucosa; Nasal Swab  Result Value Ref Range Status   MRSA by PCR Next Gen NOT DETECTED NOT DETECTED Final    Comment: (NOTE) The GeneXpert MRSA Assay (FDA approved for NASAL specimens only), is one component of a comprehensive MRSA colonization surveillance program. It is not intended to diagnose MRSA infection nor to guide or monitor treatment for MRSA infections. Test performance is not FDA approved in patients less than 25 years old. Performed at [facility], [address]  [place], [place] [address]          Radiology Studies: CT ABDOMEN PELVIS WO CONTRAST Result Date: 12/12/2023 CLINICAL DATA:  Follow-up retroperitoneal bleed EXAM: CT ABDOMEN AND PELVIS WITHOUT CONTRAST TECHNIQUE: Multidetector CT imaging of the abdomen and pelvis was performed following the standard protocol without IV contrast. RADIATION DOSE REDUCTION: This exam was performed according to the departmental dose-optimization program which includes automated exposure control,  adjustment of the mA and/or kV according to patient size and/or use of iterative reconstruction technique. COMPARISON:  CT 12/09/2023 FINDINGS: Lower chest: Lung bases demonstrate increased bilateral pleural effusions. Cardiomegaly with partially visualized valve prosthesis. 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 Hazy edema or infiltrates at the bases. Hepatobiliary: No focal liver abnormality is seen. No gallstones, gallbladder wall thickening, or biliary dilatation. Pancreas: Unremarkable. No pancreatic ductal dilatation or surrounding inflammatory changes. Spleen: Normal in size without focal abnormality. Adrenals/Urinary Tract: Adrenal glands are normal. Kidneys show no hydronephrosis. Delayed bilateral nephrogram. Urinary bladder is decompressed by Foley catheter and contains small amount of air. Slightly thick-walled bladder with mild perivesical stranding Stomach/Bowel: Stomach nonenlarged. No dilated small bowel. No acute bowel wall thickening. Diverticular disease of the left colon. Negative appendix. Vascular/Lymphatic: Moderate aortic atherosclerosis. No aneurysm. No suspicious lymph nodes Reproductive: Negative prostate Other: No pelvic effusion or free air. Redemonstrated right retroperitoneal hematoma with hematocrit level. Hematoma measures slightly larger compared to the prior exam, reference series 3, image 46 with measurements of 8.9 x 6.3 compared with 7.4 x 6.2 cm previously. Hematoma measures about 16.8 cm craniocaudad on sagittal series 7, image 49, previously 14.7 cm. Small volume hemorrhage tracking inferiorly along the right psoas. Generalized subcutaneous edema. Musculoskeletal: No acute osseous abnormality. Chronic bilateral pars defect at L5 IMPRESSION: 1. Redemonstrated large right retroperitoneal hematoma with hematocrit level. Hematoma measures slightly larger compared to the prior exam. 2. Increased bilateral pleural effusions with hazy edema or infiltrates at the lung bases.  Cardiomegaly. 3. Delayed bilateral nephrogram suggesting renal dysfunction. Correlate with appropriate laboratory values. Urinary bladder is decompressed by Foley catheter. Slightly thick-walled bladder with mild perivesical stranding, correlate for cystitis. 4. Aortic atherosclerosis. Aortic Atherosclerosis (ICD10-I70.0). Electronically Signed   By: Luke Bun M.D.   On: 12/12/2023 20:42        Scheduled Meds:  atorvastatin   80 mg Oral Daily   Chlorhexidine  Gluconate Cloth  6 each Topical Daily   vitamin B-12  1,000 mcg Oral Daily   dexamethasone   6 mg Oral QHS   docusate sodium   100 mg Oral BID   escitalopram   10 mg Oral Daily   feeding supplement (NEPRO CARB STEADY)  237 mL Oral BID BM   gabapentin   300 mg Oral Daily   insulin  aspart  0-20 Units Subcutaneous TID WC   insulin  aspart  0-5 Units Subcutaneous QHS   insulin  glargine-yfgn  14 Units Subcutaneous Daily   multivitamin  1 tablet Oral QHS   pantoprazole   40 mg Oral Daily   Continuous Infusions:   LOS: 7 days    Time spent: 40 minutes    Toribio Hummer, MD [facility]   To contact the attending provider between 7A-7P or the covering provider during after hours 7P-7A, please log into the web site [email or web address] and access using universal [place] password for that web site. If you do not have the password, please call the hospital operator.  12/13/2023, 10:45 AM

## 2023-12-14 DIAGNOSIS — I509 Heart failure, unspecified: Secondary | ICD-10-CM

## 2023-12-14 DIAGNOSIS — N179 Acute kidney failure, unspecified: Secondary | ICD-10-CM | POA: Diagnosis not present

## 2023-12-14 DIAGNOSIS — E875 Hyperkalemia: Secondary | ICD-10-CM | POA: Diagnosis not present

## 2023-12-14 LAB — RENAL FUNCTION PANEL
Albumin: 2.5 g/dL — ABNORMAL LOW (ref 3.5–5.0)
Anion gap: 9 (ref 5–15)
BUN: 62 mg/dL — ABNORMAL HIGH (ref 8–23)
CO2: 30 mmol/L (ref 22–32)
Calcium: 7.9 mg/dL — ABNORMAL LOW (ref 8.9–10.3)
Chloride: 93 mmol/L — ABNORMAL LOW (ref 98–111)
Creatinine, Ser: 1.43 mg/dL — ABNORMAL HIGH (ref 0.61–1.24)
GFR, Estimated: 49 mL/min — ABNORMAL LOW (ref >60)
Glucose, Bld: 237 mg/dL — ABNORMAL HIGH (ref 70–99)
Phosphorus: 2.5 mg/dL (ref 2.5–4.6)
Potassium: 3.9 mmol/L (ref 3.5–5.1)
Sodium: 132 mmol/L — ABNORMAL LOW (ref 135–145)

## 2023-12-14 LAB — GLUCOSE, CAPILLARY
Glucose-Capillary: 262 mg/dL — ABNORMAL HIGH (ref 70–99)
Glucose-Capillary: 353 mg/dL — ABNORMAL HIGH (ref 70–99)
Glucose-Capillary: 298 mg/dL — ABNORMAL HIGH (ref 70–99)
Glucose-Capillary: 223 mg/dL — ABNORMAL HIGH (ref 70–99)

## 2023-12-14 LAB — MAGNESIUM: Magnesium: 2.1 mg/dL (ref 1.7–2.4)

## 2023-12-14 LAB — CBC
HCT: 29.3 % — ABNORMAL LOW (ref 39.0–52.0)
Hemoglobin: 9.7 g/dL — ABNORMAL LOW (ref 13.0–17.0)
MCH: 28 pg (ref 26.0–34.0)
MCHC: 33.1 g/dL (ref 30.0–36.0)
MCV: 84.4 fL (ref 80.0–100.0)
Platelets: 174 10*3/uL (ref 150–400)
RBC: 3.47 MIL/uL — ABNORMAL LOW (ref 4.22–5.81)
RDW: 16.4 % — ABNORMAL HIGH (ref 11.5–15.5)
WBC: 14.7 10*3/uL — ABNORMAL HIGH (ref 4.0–10.5)
nRBC: 0.1 % (ref 0.0–0.2)

## 2023-12-14 MED ORDER — ZINC SULFATE 220 (50 ZN) MG PO CAPS
220.0000 mg | ORAL_CAPSULE | Freq: Every day | ORAL | Status: AC
  Administered 2023-12-14 – 2023-12-27 (×14): 220 mg via ORAL
  Filled 2023-12-14 (×14): qty 1

## 2023-12-14 MED ORDER — FUROSEMIDE 40 MG PO TABS
40.0000 mg | ORAL_TABLET | Freq: Two times a day (BID) | ORAL | Status: DC
  Administered 2023-12-15 – 2023-12-28 (×28): 40 mg via ORAL
  Filled 2023-12-14 (×28): qty 1

## 2023-12-14 MED ORDER — FUROSEMIDE 10 MG/ML IJ SOLN
80.0000 mg | Freq: Once | INTRAMUSCULAR | Status: AC
  Administered 2023-12-14: 80 mg via INTRAVENOUS
  Filled 2023-12-14: qty 8

## 2023-12-14 NOTE — Progress Notes (Signed)
 Physical Therapy Treatment Patient Details Name: Shane [name] MRN: [id number] DOB: 03-10-1941 Today's Date: 12/14/2023   History of Present Illness 83 year old male presented to ED 1/1 with shortness of breath.  Dx: Covid 19 PNA, Acute kidney injury, Acute hypoxic respiratory failure, Normocytic Anemia - secondary to RP bleed and critical illness, Acute HF with reduced EF, s/p CRRT. With significant past medical history including obstructive sleep apnea on CPAP at night, CAD s/p CABG x3 in 2021, aortic stenosis s/p TAVR 08/2023 at [facility], HFpEF, atrial fibrillation on Eliquis , plavix , sick sinus syndrome s/p leadless PPM, hypertension, hyperlipidemia, GERD, uncontrolled diabetes, CKD 3, pulmonary fibrosis    PT Comments  Eager to work with therapies today. Unfortunately, despite wearing shrinker sock consistently, he is still unable to fit into his RLE prosthesis. We were able to progress with transfer training, using Stedy, and Max A +2 for boost to stand. Due to not progressing as quickly as we had anticipated, pt will require post acute rehab to successfully and safely return to PLOF. Patient will benefit from intensive inpatient follow up therapy, >3 hours/day. Patient will continue to benefit from skilled physical therapy services to further improve independence with functional mobility.     If plan is discharge home, recommend the following: A lot of help with walking and/or transfers;A lot of help with bathing/dressing/bathroom;Assistance with cooking/housework;Assist for transportation;Help with stairs or ramp for entrance   Can travel by private vehicle        Equipment Recommendations  Other (comment) (TBD next venue)    Recommendations for Other Services Rehab consult     Precautions / Restrictions Precautions Precautions: Fall Precaution Comments: Lt disolateral foot wound. Prior Rt BKA. Wears 2 L O2 at night Required Braces or Orthoses: Other Brace Other Brace: RLE prosthesis,  unable to don due to edema, pt has shrinker in room and has been utilizing Restrictions Weight Bearing Restrictions Per Provider Order: No     Mobility  Bed Mobility Overal bed mobility: Needs Assistance Bed Mobility: Supine to Sit, Sit to Supine     Supine to sit: Contact guard Sit to supine: Contact guard assist, Used rails   General bed mobility comments: CGA for safety, improved bed mobility today, using rail as needed.    Transfers Overall transfer level: Needs assistance Equipment used: Ambulation equipment used Transfers: Sit to/from Stand Sit to Stand: Max assist, +2 safety/equipment, +2 physical assistance, From elevated surface           General transfer comment: Max A + 2 for boost to stand using Stedy today. Completed 2 times using bed pad to lift hips from bed. Max Cues for knee and hip extension. Fatigues rapidly. Transfer via Lift Equipment: Stedy  Ambulation/Gait                   Stairs             Wheelchair Mobility     Tilt Bed    Modified Rankin (Stroke Patients Only)       Balance Overall balance assessment: Needs assistance Sitting-balance support: No upper extremity supported, Feet supported Sitting balance-Leahy Scale: Fair     Standing balance support: Bilateral upper extremity supported, Reliant on assistive device for balance Standing balance-Leahy Scale: Zero Standing balance comment: maxA for partial stand                            Cognition Arousal: Alert Behavior During Therapy: [facility]  for tasks assessed/performed Overall Cognitive Status: Within Functional Limits for tasks assessed                                          Exercises General Exercises - Lower Extremity Ankle Circles/Pumps: AROM, Left, 10 reps, Supine Quad Sets: Strengthening, Both, 10 reps, Supine    General Comments General comments (skin integrity, edema, etc.): HR to 120s with activity. SPO2 96% on 2L  supplemental O2.      Pertinent Vitals/Pain Pain Assessment Pain Assessment: Faces Faces Pain Scale: Hurts little more Pain Location: Lt hip, Rt thigh Pain Descriptors / Indicators: Sore, Grimacing Pain Intervention(s): Monitored during session, Repositioned    Home Living                          Prior Function            PT Goals (current goals can now be found in the care plan section) Acute Rehab PT Goals Patient Stated Goal: Get well, go home PT Goal Formulation: With patient/family Time For Goal Achievement: 12/26/23 Potential to Achieve Goals: Good Progress towards PT goals: Progressing toward goals    Frequency    Min 1X/week      PT Plan      Co-evaluation PT/OT/SLP Co-Evaluation/Treatment: Yes Reason for Co-Treatment: Complexity of the patient's impairments (multi-system involvement);To address functional/ADL transfers;For patient/therapist safety PT goals addressed during session: Mobility/safety with mobility;Balance;Strengthening/ROM;Proper use of DME OT goals addressed during session: ADL's and self-care;Proper use of Adaptive equipment and DME      AM-PAC PT 6 Clicks Mobility   Outcome Measure  Help needed turning from your back to your side while in a flat bed without using bedrails?: A Little Help needed moving from lying on your back to sitting on the side of a flat bed without using bedrails?: A Little Help needed moving to and from a bed to a chair (including a wheelchair)?: Total Help needed standing up from a chair using your arms (e.g., wheelchair or bedside chair)?: Total Help needed to walk in hospital room?: Total Help needed climbing 3-5 steps with a railing? : Total 6 Click Score: 10    End of Session Equipment Utilized During Treatment: Gait belt Activity Tolerance: Patient limited by fatigue Patient left: in bed;with call bell/phone within reach;with family/visitor present;with bed alarm set Nurse Communication:  Mobility status;Need for lift equipment PT Visit Diagnosis: Muscle weakness (generalized) (M62.81);Difficulty in walking, not elsewhere classified (R26.2);Pain Pain - Right/Left: Right Pain - part of body:  (flank and abdomen)     Time: 8596-8569 PT Time Calculation (min) (ACUTE ONLY): 27 min  Charges:    $Therapeutic Activity: 8-22 mins PT General Charges $$ ACUTE PT VISIT: 1 Visit                     Leontine Roads, PT, DPT [facility]  Rehabilitation Services Physical Therapist Office: [phone] Website: [email or web address]    Leontine GORMAN Roads 12/14/2023, 3:14 PM

## 2023-12-14 NOTE — Progress Notes (Signed)
 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 Geronimo, who presented to the emergency department on 12/06/2023 with shortness of breath. Per the ED provider complaining of cough and shortness of breath over the past 3 days. Using his nebulizers at home with no improvement in his symptoms. EMS was called and found to be hypotensive and hypoxic. He was started on nonrebreather and an epinephrine  infusion prior to arrival. In the ED afebrile, SBP around 100, tachypneic. Labs +COVID, AKI on CKD with BUN/Cr 116, 4.69 with critical K of 7.1. bicarb 16. CBC without leukocytosis, hgb 9.5. lactic 2.3>1.9, troponin 8,290, UA negative, blood cultures pending. CXR with likely pulmonary edema. With new AKI, hyperkalemia and EKG changes, nephrology was consulted. Patient given lokelma , albuterol , Lasix , sodium bicarb, 5U insulin  with D50, calcium  gluconate. Was also given azithromycin /rocephin  CAP coverage. Neprhology recommended albumin  for BP support (initially on 2mcg levo, now off), Lasix  80mg  IV, renal ultrasound. Pending repeat BMP on consult for admission to ICU.     Significant events: 1/1: Admit to CCM for covid pneumonia, aki with hyperK likely needing CRRT  Renal consultation -presented with a creatinine of 4.  7 mg percent.  [Baseline July 2024 was 1.8 mg percent] 1/2: CRRT initiated, Card Consult due to elev Troponin (Nstemi vs Demand Ischemia)  Cardiology consultation -ejection fraction 40% compared to baseline 55%. 1/3:  Continuing CRRT till this afternoon, PCCM sign off this afternoon, trx to [facility] tomorrow  -> heparin  being continuedFor total 48 hours.  Cardiology recommended continuing Plavix  but holding aspirin .  Cardiology recommending Toprol  once blood pressure stabilized.      Significant studies: 1/1 admission CXR: bilateral infiltrates c/w CHF 1/1 US  renal: Unremarkable  1/2 Echo: EF 40%, RWMA noted, reduced RVSF, moderate MR, TAVR 1/4 CT abdomen and pelvis: new active right RP bleed, chronic ILD   Significant microbiology data: 1/1 COVID + 1/1 Blood cx x2: NG    Procedures: 1/2 CRRT    Consults: Nephrology General Surgery  IR Gastroenterology   Assessment and Plan: #1 acute on chronic hypoxic respiratory failure likely multifactorial secondary to acute COVID-19 infection, in the setting of pulmonary edema secondary to acute systolic CHF exacerbation in the setting of underlying pulmonary fibrosis -Patient noted to be on baseline O2 2 L nasal cannula at bedtime -Patient noted to have presented with cough, shortness of breath x 3 days with no improvement on home nebulizers. -EMS noted on presentation patient to be hypoxic. -Patient states wife and grandson had been sick with COVID infection recently. -MRSA PCR negative. -SARS coronavirus 2 PCR positive, influenza A and B by PCR negative. -Initial chest x-ray findings concerning for CHF exacerbation. -Patient initially received a dose of IV azithromycin  and Rocephin  x 1. -Patient status post 3-day course of remdesivir  and currently on Decadron . -Patient also received IV Lasix  with diuresis. -Patient also noted to have undergone CRRT during the hospitalization. -Patient currently down to 2L nasal cannula with sats of 100%. -Cont to wean O2. -Hycodan as needed. -Patient with clinical improvement. -Continue IV Lasix .   2.  Non-STEMI -Patient on admission noted to have elevated troponins peaking at 8-9 0 and subsequently trending  down. -Felt likely secondary to demand ischemia in the setting of COVID-19 infection with underlying cardiomyopathy CAD/history of CABG. -Patient denies any chest pain. -2D echo obtained with EF of 40%,WMA, moderately dilated left atrial size, mildly dilated right atrial size, status post bioprosthetic aortic valve/status post TAVR. -Patient seen in consultation by cardiology who discussed with patient and decision made to treat medically with IV heparin  for 48 hours and continue medical therapy. -IV heparin  discontinued due to spontaneous retroperitoneal bleed. -Discussed with Cardiology , given bleed risk, ok to hold asa as well at this point   3.  Acute retroperitoneal bleed -Noted on CT abdomen and pelvis with contrast 12/09/2023. -Patient status post transfusion of 1 unit platelets, 2 units PRBCs, thiamine, DDAVP . -Patient noted to have started on aspirin  which has subsequently been discontinued. -Anticoagulation for thromboembolic prophylaxis given A-fib on hold due to risk of bleeding. -Hemoglobin currently at 9.1 from 7.0 after transfusion of 2 units PRBCs on 12/12/2023.   -Hemoglobin noted to have been 8.0 on 12/11/2023. -Due to presentation with non-STEMI and cardiac history patient was transfused 2 units PRBCs on 12/12/2023.  -Repeat CT abdomen and pelvis (12/12/2023 ), with redemonstrated large right retroperitoneal hematoma with hematocrit level.  Hematoma measures slightly larger compared to prior exam.  Increased bilateral pleural effusions with hazy edema or infiltrates at the lung bases.  Cardiomegaly.   -IR following.  -Hgb stable   4.  Paroxysmal A-fib -Currently rate controlled. -Anticoagulation on hold secondary to retroperitoneal bleed as risk of bleeding outweighs benefits at this time and per cardiology recommendations. -Outpatient follow-up with primary cardiologist for possible evaluation for Watchman versus retrial per cardiology. -Per cardiology.   5.  History of complete  heart block/SSS/status post pacemaker implantation -Stable. -Per cardiology.   6.  Hyperlipidemia -Statin.   7.  CAD status post CABG/status post TAVR -Patient noted to have last dose of Plavix  12/08/2023. -Patient was to be started on aspirin  12/11/2023 per cardiology recommendations however due to drop in hemoglobin aspirin  discontinued. -Per cardiology.   8.  Acute kidney injury -Felt likely hemodynamically mediated from combination of CHF exacerbation, acute infection with evolution to ATN in the setting of ACE inhibitor -Patient being followed by nephrology and underwent CRRT 1/2-1/3. -CRRT paused but restarted 12/09/2023 in the setting of retroperitoneal bleed and stopped on 12/11/2023. -Patient with urine output of 2.790 L over the past 24 hours. -Creatinine currently at 1.86 today from 2.22 yesterday. -Continue to hold ACE inhibitor -Per nephrology.   9.  Hyperkalemia -Likely secondary to problem #8 -Resolved.  Potassium of 4.0. -Lokelma  discontinued. -Ensure changed to nephro per nephrology. -Per nephrology.   10.  Anion gap metabolic acidosis -Likely secondary to acute kidney injury. -Improved with CRRT. -Per nephrology.   11.  Shock -Not largely cardiogenic but cannot rule out a septic component.-Patien -Status post dose of antibiotic coverage for community-acquired pneumonia and status post treatment for COVID infection. -Shock resolved.   12.  Normocytic anemia/acute blood loss anemia -Likely secondary to retroperitoneal bleed and acute illness. -See above, retroperitoneal bleed. -Not ideal, but ok to hold ASA as well as anticoagulation at this time given increased risk of life threatening bleeding   13.  Dysphagia -Patient seen in consultation by GI, patient noted to have some intermittent dysphagia mostly to solids, dense bread and meat occasionally to liquids. -GI and patient deferring any procedures at this point  and GI recommending patient will benefit from EGD  for evaluation of possible esophageal dilatation based on his symptoms in the outpatient setting which will be arranged. -GI recommending consideration of obtaining barium esophagram prior to discharge to exclude any high risk lesions. -Barium esophagram ordered and pending.   14.  Diabetes mellitus type 2 -Hemoglobin A1c 8.5 (12/06/2023) -Patient noted to be on Farxiga , Tresiba  20 units daily, prior to admission. -Patient presenting acute kidney injury, Farxiga  on hold. -Patient also noted on Decadron . -CBG 247 this morning. -Increase Semglee  to 14 units daily, continue SSI, Neurontin .       Subjective: Feeling better today  Physical Exam: Vitals:   12/14/23 1200 12/14/23 1300 12/14/23 1400 12/14/23 1504  BP:    123/61  Pulse: 71 70 68 60  Resp: 20 15 17    Temp:      TempSrc:      SpO2: 100% 100% 100% 100%  Weight:      Height:       General exam: Awake, laying in bed, in nad Respiratory system: Normal respiratory effort, no wheezing Cardiovascular system: regular rate, s1, s2 Gastrointestinal system: Soft, nondistended, positive BS Central nervous system: CN2-12 grossly intact, strength intact Extremities: Perfused, no clubbing Skin: Normal skin turgor, no notable skin lesions seen Psychiatry: Mood normal // no visual hallucinations   Data Reviewed:  Labs reviewed: Na 132, K 3.9, Cr 1.43, WBC 14.7, Hgb 9.7  Family Communication: Pt in room, family at bedside  Disposition: Status is: Inpatient Remains inpatient appropriate because: severity of illness  Planned Discharge Destination: Rehab    Author: Garnette Pelt, MD 12/14/2023 5:58 PM  For on call review [email or web address].

## 2023-12-14 NOTE — Progress Notes (Signed)
  Inpatient Rehab Admissions Coordinator :  Per therapy recommendations, patient was screened for CIR candidacy by Ottie Glazier RN MSN.  At this time patient appears to be a potential candidate for CIR. I will place a rehab consult per protocol for full assessment. Please call me with any questions.  Ottie Glazier RN MSN Admissions Coordinator [phone]

## 2023-12-14 NOTE — Plan of Care (Signed)
   Problem: Education: Goal: Knowledge of risk factors and measures for prevention of condition will improve Outcome: Progressing   Problem: Coping: Goal: Psychosocial and spiritual needs will be supported Outcome: Progressing   Problem: Respiratory: Goal: Will maintain a patent airway Outcome: Progressing Goal: Complications related to the disease process, condition or treatment will be avoided or minimized Outcome: Progressing   Problem: Education: Goal: Ability to describe self-care measures that may prevent or decrease complications (Diabetes Survival Skills Education) will improve Outcome: Progressing Goal: Individualized Educational Video(s) Outcome: Progressing   Problem: Coping: Goal: Ability to adjust to condition or change in health will improve Outcome: Progressing   Problem: Fluid Volume: Goal: Ability to maintain a balanced intake and output will improve Outcome: Progressing   Problem: Health Behavior/Discharge Planning: Goal: Ability to identify and utilize available resources and services will improve Outcome: Progressing Goal: Ability to manage health-related needs will improve Outcome: Progressing   Problem: Metabolic: Goal: Ability to maintain appropriate glucose levels will improve Outcome: Progressing   Problem: Nutritional: Goal: Maintenance of adequate nutrition will improve Outcome: Progressing Goal: Progress toward achieving an optimal weight will improve Outcome: Progressing   Problem: Skin Integrity: Goal: Risk for impaired skin integrity will decrease Outcome: Progressing   Problem: Tissue Perfusion: Goal: Adequacy of tissue perfusion will improve Outcome: Progressing   Problem: Education: Goal: Knowledge of General Education information will improve Description: Including pain rating scale, medication(s)/side effects and non-pharmacologic comfort measures Outcome: Progressing   Problem: Health Behavior/Discharge Planning: Goal:  Ability to manage health-related needs will improve Outcome: Progressing   Problem: Clinical Measurements: Goal: Ability to maintain clinical measurements within normal limits will improve Outcome: Progressing Goal: Will remain free from infection Outcome: Progressing Goal: Diagnostic test results will improve Outcome: Progressing Goal: Respiratory complications will improve Outcome: Progressing Goal: Cardiovascular complication will be avoided Outcome: Progressing   Problem: Activity: Goal: Risk for activity intolerance will decrease Outcome: Progressing   Problem: Nutrition: Goal: Adequate nutrition will be maintained Outcome: Progressing   Problem: Coping: Goal: Level of anxiety will decrease Outcome: Progressing   Problem: Elimination: Goal: Will not experience complications related to bowel motility Outcome: Progressing Goal: Will not experience complications related to urinary retention Outcome: Progressing   Problem: Pain Management: Goal: General experience of comfort will improve Outcome: Progressing   Problem: Safety: Goal: Ability to remain free from injury will improve Outcome: Progressing   Problem: Skin Integrity: Goal: Risk for impaired skin integrity will decrease Outcome: Progressing

## 2023-12-14 NOTE — Plan of Care (Signed)
  Problem: Education: Goal: Knowledge of risk factors and measures for prevention of condition will improve Outcome: Progressing   Problem: Coping: Goal: Psychosocial and spiritual needs will be supported Outcome: Progressing   Problem: Respiratory: Goal: Will maintain a patent airway Outcome: Progressing Goal: Complications related to the disease process, condition or treatment will be avoided or minimized Outcome: Progressing   Problem: Education: Goal: Ability to describe self-care measures that may prevent or decrease complications (Diabetes Survival Skills Education) will improve Outcome: Progressing Goal: Individualized Educational Video(s) Outcome: Progressing   Problem: Health Behavior/Discharge Planning: Goal: Ability to identify and utilize available resources and services will improve Outcome: Progressing Goal: Ability to manage health-related needs will improve Outcome: Progressing   Problem: Fluid Volume: Goal: Ability to maintain a balanced intake and output will improve Outcome: Progressing   Problem: Metabolic: Goal: Ability to maintain appropriate glucose levels will improve Outcome: Progressing   Problem: Education: Goal: Knowledge of General Education information will improve Description: Including pain rating scale, medication(s)/side effects and non-pharmacologic comfort measures Outcome: Progressing   Problem: Tissue Perfusion: Goal: Adequacy of tissue perfusion will improve Outcome: Progressing

## 2023-12-14 NOTE — Progress Notes (Signed)
 [facility] Progress Note   Assessment/ Plan:    1.  Acute kidney injury: Appears to be hemodynamically mediated possibly from a combination of CHF exacerbation +/- sepsis with evolution to ATN.   - CRRT 1/2-1/3.  Then CRRT was paused but restarted 12/09/23 in setting of RP bleed.  Stopped CRRT on 1/6 AM.   - Improving with supportive care   - discontinue nontunneled dialysis catheter   - diuretics as below  - Discussed that I would likely sign off tomorrow after reviewing labs and oxygen requirement and set up outpatient follow-up with nephrology in two weeks - he is comfortable with this  2.  Hyperkalemia:  - resolved - changed ensure to nepro   - diet education provided to pt and wife  3.  Anion gap metabolic acidosis:  - resolved s/p CRRT   4.  Shock - resolved: Appears to be largely cardiogenic but cannot rule out septic component with presentation.  He was started on antibiotic coverage for community-acquired pneumonia and has covid. - s/p abx per primary team   5. Acute HF with reduced EF - s/p CRRT - lasix  intermittently - will give lasix  80 mg IV once today.  Plan for lasix  40 mg PO BID to start tomorrow (a slight increase from his home regimen).  Home lasix  dose is 40 mg in the AM and 20 mg in the afternoon   6. Covid 19 PNA - Therapies primary team  - optimizing volume status as above   7.  Acute hypoxic respiratory failure: Appears to be from a combination of CHF exacerbation +/- underlying interstitial lung disease and now community-acquired pneumonia and covid. - optimize volume with diuretics   8.  Normocytic Anemia:  - secondary to RP bleed and critical illness.   - PRBC's per primary team.   - Note pt with NSTEMI which was felt to be supply/demand ischemia in setting of covid with underlying cardiomyopathy, CAD per cardiology.  Defer ESA for now  9. RP Bleed:  - per PCCM  - PRBC's per primary team   Disposition - per primary team.  Acceptable  for a regular floor bed from my standpoint    Subjective:    He had 3.8 liters [name] over 1/8.  He wants to make sure that he gets another dose of lasix  today.  He and his wife confirm that home lasix  regimen is 40 mg in the AM and 20 mg PM.  His wife is at bedside.      Review of systems:    He denies shortness of breath - states that his breathing is doing better  Still feels swollen but better Denies chest pain Denies n/v No difficulty with urination     Objective:   BP 137/65 (BP Location: Left Arm)   Pulse 63   Temp 97.8 F (36.6 C) (Oral)   Resp 16   Ht 6' (1.829 m)   Wt 108.4 kg   SpO2 100%   BMI 32.41 kg/m   Intake/Output Summary (Last 24 hours) at 12/14/2023 0839 Last data filed at 12/14/2023 9166 Gross per 24 hour  Intake 240 ml  Output 3950 ml  Net -3710 ml   Weight change:   Physical Exam:    General elderly male in bed in no acute distress HEENT normocephalic atraumatic extraocular movements intact sclera anicteric Neck supple trachea midline Lungs clear to auscultation bilaterally normal work of breathing at rest; getting 6 liters oxygen  Heart S1S2 no rub  Abdomen soft nontender nondistended Extremities 1+ edema lower extremities; right BKA Psych normal mood and affect Neuro alert and oriented x 3 provides hx and follows commands GU foley catheter Access: RIJ non-tunneled HD catheter  Imaging: CT ABDOMEN PELVIS WO CONTRAST Result Date: 12/12/2023 CLINICAL DATA:  Follow-up retroperitoneal bleed EXAM: CT ABDOMEN AND PELVIS WITHOUT CONTRAST TECHNIQUE: Multidetector CT imaging of the abdomen and pelvis was performed following the standard protocol without IV contrast. RADIATION DOSE REDUCTION: This exam was performed according to the departmental dose-optimization program which includes automated exposure control, adjustment of the mA and/or kV according to patient size and/or use of iterative reconstruction technique. COMPARISON:  CT 12/09/2023 FINDINGS: Lower  chest: Lung bases demonstrate increased bilateral pleural effusions. Cardiomegaly with partially visualized valve prosthesis. 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 Hazy edema or infiltrates at the bases. Hepatobiliary: No focal liver abnormality is seen. No gallstones, gallbladder wall thickening, or biliary dilatation. Pancreas: Unremarkable. No pancreatic ductal dilatation or surrounding inflammatory changes. Spleen: Normal in size without focal abnormality. Adrenals/Urinary Tract: Adrenal glands are normal. Kidneys show no hydronephrosis. Delayed bilateral nephrogram. Urinary bladder is decompressed by Foley catheter and contains small amount of air. Slightly thick-walled bladder with mild perivesical stranding Stomach/Bowel: Stomach nonenlarged. No dilated small bowel. No acute bowel wall thickening. Diverticular disease of the left colon. Negative appendix. Vascular/Lymphatic: Moderate aortic atherosclerosis. No aneurysm. No suspicious lymph nodes Reproductive: Negative prostate Other: No pelvic effusion or free air. Redemonstrated right retroperitoneal hematoma with hematocrit level. Hematoma measures slightly larger compared to the prior exam, reference series 3, image 46 with measurements of 8.9 x 6.3 compared with 7.4 x 6.2 cm previously. Hematoma measures about 16.8 cm craniocaudad on sagittal series 7, image 49, previously 14.7 cm. Small volume hemorrhage tracking inferiorly along the right psoas. Generalized subcutaneous edema. Musculoskeletal: No acute osseous abnormality. Chronic bilateral pars defect at L5 IMPRESSION: 1. Redemonstrated large right retroperitoneal hematoma with hematocrit level. Hematoma measures slightly larger compared to the prior exam. 2. Increased bilateral pleural effusions with hazy edema or infiltrates at the lung bases. Cardiomegaly. 3. Delayed bilateral nephrogram suggesting renal dysfunction. Correlate with appropriate laboratory values. Urinary bladder is decompressed  by Foley catheter. Slightly thick-walled bladder with mild perivesical stranding, correlate for cystitis. 4. Aortic atherosclerosis. Aortic Atherosclerosis (ICD10-I70.0). Electronically Signed   By: Luke Bun M.D.   On: 12/12/2023 20:42     Labs: BMET Recent Labs  Lab 12/09/23 1606 12/10/23 0416 12/10/23 1607 12/11/23 0307 12/12/23 0339 12/13/23 0452 12/14/23 0409  NA 133* 134* 132* 132* 130* 132* 132*  K 4.8 4.4 4.2 4.1 3.7 4.0 3.9  CL 98 100 97* 98 95* 97* 93*  CO2 25 25 25 25 25 27 30   GLUCOSE 175* 118* 126* 149* 216* 248* 237*  BUN 63* 43* 37* 32* 55* 65* 62*  CREATININE 1.86* 1.58* 1.65* 1.64* 2.22* 1.86* 1.43*  CALCIUM  8.0* 7.6* 7.7* 7.6* 7.7* 7.8* 7.9*  PHOS 6.0* 4.5 4.8* 3.9 4.4 3.8 2.5   CBC Recent Labs  Lab 12/12/23 1801 12/13/23 0452 12/13/23 1811 12/14/23 0409  WBC 13.9* 12.2* 14.2* 14.7*  HGB 9.5* 9.1* 9.9* 9.7*  HCT 29.0* 27.0* 29.3* 29.3*  MCV 84.5 84.1 84.4 84.4  PLT 137* 140* 163 174    Medications:     atorvastatin   80 mg Oral Daily   Chlorhexidine  Gluconate Cloth  6 each Topical Daily   vitamin B-12  1,000 mcg Oral Daily   dexamethasone   6 mg Oral QHS   docusate sodium   100 mg Oral BID   escitalopram   10 mg Oral Daily   feeding supplement (NEPRO CARB STEADY)  237 mL Oral BID BM   gabapentin   300 mg Oral Daily   insulin  aspart  0-20 Units Subcutaneous TID WC   insulin  aspart  0-5 Units Subcutaneous QHS   insulin  aspart  3 Units Subcutaneous TID WC   insulin  glargine-yfgn  20 Units Subcutaneous Daily   multivitamin  1 tablet Oral QHS   pantoprazole   40 mg Oral Daily    Katheryn JAYSON Saba, MD 12/14/2023, 9:03 AM

## 2023-12-14 NOTE — Inpatient Diabetes Management (Signed)
 Inpatient Diabetes Program Recommendations  AACE/ADA: New Consensus Statement on Inpatient Glycemic Control (2015)  Target Ranges:  Prepandial:   less than 140 mg/dL      Peak postprandial:   less than 180 mg/dL (1-2 hours)      Critically ill patients:  140 - 180 mg/dL   Lab Results  Component Value Date   GLUCAP 353 (H) 12/14/2023   HGBA1C 8.5 (H) 12/06/2023    Review of Glycemic Control  Latest Reference Range & Units 12/13/23 08:02 12/13/23 11:47 12/13/23 15:58 12/13/23 19:15 12/13/23 22:44 12/14/23 08:01 12/14/23 11:58  Glucose-Capillary 70 - 99 mg/dL 752 (H) 574 (H) 617 (H) 324 (H) 316 (H) 262 (H) 353 (H)  (H): Data is abnormally high  Diabetes history: DM 2 Outpatient Diabetes medications:  Humalog  10-15 units tid with meals Tresiba  20 units daily Current orders for Inpatient glycemic control:  Semglee  20 units daily Decadron  6 mg daily Novolog  0-20 units tid with meals and HS Novolog  3 units TID with meals  Inpatient Diabetes Program Recommendations:    Please consider:  Semglee  25 units every day Novolog  6 units TID with meals if he consumes at least 50%  Will continue to follow while inpatient.  Thank you, Wyvonna Pinal, MSN, CDCES Diabetes Coordinator Inpatient Diabetes Program [phone] (team pager from 8a-5p)

## 2023-12-14 NOTE — Progress Notes (Signed)
 Occupational Therapy Treatment Patient Details Name: Shane [name] MRN: [id number] DOB: [birth year] Today's Date: 12/14/2023   History of present illness 83 year old male presented to ED 1/1 with shortness of breath.  Dx: Covid 19 PNA, Acute kidney injury, Acute hypoxic respiratory failure, Normocytic Anemia - secondary to RP bleed and critical illness, Acute HF with reduced EF, s/p CRRT. With significant past medical history including obstructive sleep apnea on CPAP at night, CAD s/p CABG x3 in 2021, aortic stenosis s/p TAVR 08/2023 at [facility], HFpEF, atrial fibrillation on Eliquis , plavix , sick sinus syndrome s/p leadless PPM, hypertension, hyperlipidemia, GERD, uncontrolled diabetes, CKD 3, pulmonary fibrosis   OT comments  Session focused on progressing transfers and standing tolerance. Pt's R residual limb remains too swollen to fit prosthesis causing increased difficulty with transfers. Pt able to complete brief standing attempts in [place] with Max A x 2 while assisted with linen changes, noting increased SOB with these tasks. On initial evaluation, anticipated quick improvements with ability to use R prosthesis though pt remains limited by deficits in strength, cardiopulmonary endurance and edema. Pt's wife present, supportive and both with concerns about managing at home with pt's current functional abilities. Pt motivated and would likely progress well with intensive rehab services to maximize safe DC home.       If plan is discharge home, recommend the following:  A lot of help with walking and/or transfers;Two people to help with walking and/or transfers;A lot of help with bathing/dressing/bathroom;Two people to help with bathing/dressing/bathroom   Equipment Recommendations  None recommended by OT    Recommendations for Other Services Rehab consult    Precautions / Restrictions Precautions Precautions: Fall Precaution Comments: Lt disolateral foot wound. Prior Rt BKA. Wears 2 L O2 at  night Required Braces or Orthoses: Other Brace Other Brace: RLE prosthesis, unable to don due to edema, pt has shrinker in room and has been utilizing Restrictions Weight Bearing Restrictions Per Provider Order: No       Mobility Bed Mobility Overal bed mobility: Needs Assistance Bed Mobility: Supine to Sit, Sit to Supine     Supine to sit: Contact guard Sit to supine: Contact guard assist        Transfers Overall transfer level: Needs assistance Equipment used: Ambulation equipment used Transfers: Sit to/from Stand Sit to Stand: Max assist, +2 safety/equipment, +2 physical assistance, From elevated surface           General transfer comment: Max A x 2 for 2 brief standing attempts in [place] with use of bed pad to lift bottom.     Balance Overall balance assessment: Needs assistance Sitting-balance support: No upper extremity supported, Feet supported Sitting balance-Leahy Scale: Fair     Standing balance support: Bilateral upper extremity supported, Reliant on assistive device for balance Standing balance-Leahy Scale: Zero                             ADL either performed or assessed with clinical judgement   ADL Overall ADL's : Needs assistance/impaired                     Lower Body Dressing: Maximal assistance;Sitting/lateral leans;Bed level Lower Body Dressing Details (indicate cue type and reason): to don sleeve and attempt to don R prosthetic               General ADL Comments: Focus on attempts for OOB transfers    Extremity/Trunk Assessment Upper Extremity  Assessment Upper Extremity Assessment: Overall WFL for tasks assessed;Right hand dominant   Lower Extremity Assessment Lower Extremity Assessment: Defer to PT evaluation        Vision   Vision Assessment?: No apparent visual deficits   Perception     Praxis      Cognition Arousal: Alert Behavior During Therapy: WFL for tasks assessed/performed Overall Cognitive  Status: Within Functional Limits for tasks assessed                                          Exercises Exercises: Other exercises Other Exercises Other Exercises: encouraged long sitting with B bedrails for core strength (difficulty with ICU bed)    Shoulder Instructions       General Comments Wife at bedside. 96% + on 2 L O2    Pertinent Vitals/ Pain       Pain Assessment Pain Assessment: Faces Faces Pain Scale: Hurts little more Pain Location: R hip Pain Descriptors / Indicators: Sore, Grimacing Pain Intervention(s): Monitored during session, Limited activity within patient's tolerance  Home Living                                          Prior Functioning/Environment              Frequency  Min 1X/week        Progress Toward Goals  OT Goals(current goals can now be found in the care plan section)  Progress towards OT goals: OT to reassess next treatment  Acute Rehab OT Goals Patient Stated Goal: regain strength, hopeful for rehab OT Goal Formulation: With patient/family Time For Goal Achievement: 12/26/23 Potential to Achieve Goals: Good ADL Goals Pt Will Perform Lower Body Bathing: with set-up;sitting/lateral leans;sit to/from stand Pt Will Perform Lower Body Dressing: with set-up;sitting/lateral leans;sit to/from stand Pt Will Transfer to Toilet: with set-up;stand pivot transfer;squat pivot transfer;bedside commode Pt/caregiver will Perform Home Exercise Program: Increased strength;Both right and left upper extremity;With theraband;Independently;With written HEP provided Additional ADL Goal #1: Pt to verbalize at least 3 energy conservation strategies to implement at home  Plan      Co-evaluation    PT/OT/SLP Co-Evaluation/Treatment: Yes Reason for Co-Treatment: Complexity of the patient's impairments (multi-system involvement);To address functional/ADL transfers;For patient/therapist safety   OT goals addressed  during session: ADL's and self-care;Proper use of Adaptive equipment and DME      AM-PAC OT 6 Clicks Daily Activity     Outcome Measure   Help from another person eating meals?: None Help from another person taking care of personal grooming?: A Little Help from another person toileting, which includes using toliet, bedpan, or urinal?: A Lot Help from another person bathing (including washing, rinsing, drying)?: A Lot Help from another person to put on and taking off regular upper body clothing?: A Little Help from another person to put on and taking off regular lower body clothing?: A Lot 6 Click Score: 16    End of Session Equipment Utilized During Treatment: Oxygen;Gait belt  OT Visit Diagnosis: Unsteadiness on feet (R26.81);Other abnormalities of gait and mobility (R26.89);Muscle weakness (generalized) (M62.81)   Activity Tolerance Patient tolerated treatment well   Patient Left in bed;with call bell/phone within reach;with family/visitor present   Nurse Communication Mobility status        Time:  8596-8569 OT Time Calculation (min): 27 min  Charges: OT General Charges $OT Visit: 1 Visit OT Treatments $Therapeutic Activity: 8-22 mins  Mliss NOVAK, OTR/L Acute Rehab Services Office: [phone]   Mliss Getting 12/14/2023, 2:42 PM

## 2023-12-15 DIAGNOSIS — Z89511 Acquired absence of right leg below knee: Secondary | ICD-10-CM

## 2023-12-15 DIAGNOSIS — N179 Acute kidney failure, unspecified: Secondary | ICD-10-CM | POA: Diagnosis not present

## 2023-12-15 DIAGNOSIS — E875 Hyperkalemia: Secondary | ICD-10-CM | POA: Diagnosis not present

## 2023-12-15 DIAGNOSIS — I5023 Acute on chronic systolic (congestive) heart failure: Secondary | ICD-10-CM | POA: Diagnosis not present

## 2023-12-15 DIAGNOSIS — I509 Heart failure, unspecified: Secondary | ICD-10-CM | POA: Diagnosis not present

## 2023-12-15 DIAGNOSIS — G7281 Critical illness myopathy: Secondary | ICD-10-CM

## 2023-12-15 LAB — RENAL FUNCTION PANEL
Albumin: 2.4 g/dL — ABNORMAL LOW (ref 3.5–5.0)
Anion gap: 10 (ref 5–15)
BUN: 56 mg/dL — ABNORMAL HIGH (ref 8–23)
CO2: 32 mmol/L (ref 22–32)
Calcium: 8 mg/dL — ABNORMAL LOW (ref 8.9–10.3)
Chloride: 92 mmol/L — ABNORMAL LOW (ref 98–111)
Creatinine, Ser: 1.28 mg/dL — ABNORMAL HIGH (ref 0.61–1.24)
GFR, Estimated: 56 mL/min — ABNORMAL LOW (ref >60)
Glucose, Bld: 327 mg/dL — ABNORMAL HIGH (ref 70–99)
Phosphorus: 2.7 mg/dL (ref 2.5–4.6)
Potassium: 4.4 mmol/L (ref 3.5–5.1)
Sodium: 134 mmol/L — ABNORMAL LOW (ref 135–145)

## 2023-12-15 LAB — MAGNESIUM: Magnesium: 1.7 mg/dL (ref 1.7–2.4)

## 2023-12-15 LAB — CBC
HCT: 30.1 % — ABNORMAL LOW (ref 39.0–52.0)
Hemoglobin: 9.9 g/dL — ABNORMAL LOW (ref 13.0–17.0)
MCH: 28 pg (ref 26.0–34.0)
MCHC: 32.9 g/dL (ref 30.0–36.0)
MCV: 85 fL (ref 80.0–100.0)
Platelets: 172 10*3/uL (ref 150–400)
RBC: 3.54 MIL/uL — ABNORMAL LOW (ref 4.22–5.81)
RDW: 16.1 % — ABNORMAL HIGH (ref 11.5–15.5)
WBC: 10.7 10*3/uL — ABNORMAL HIGH (ref 4.0–10.5)
nRBC: 0.2 % (ref 0.0–0.2)

## 2023-12-15 LAB — GLUCOSE, CAPILLARY
Glucose-Capillary: 355 mg/dL — ABNORMAL HIGH (ref 70–99)
Glucose-Capillary: 326 mg/dL — ABNORMAL HIGH (ref 70–99)
Glucose-Capillary: 302 mg/dL — ABNORMAL HIGH (ref 70–99)
Glucose-Capillary: 267 mg/dL — ABNORMAL HIGH (ref 70–99)

## 2023-12-15 NOTE — Progress Notes (Signed)
 Inpatient Rehab Coordinator Note:  I spoke with patient over the phone to discuss CIR recommendations and goals/expectations of CIR stay.  We reviewed 3 hrs/day of therapy, physician follow up, and average length of stay 2 weeks (dependent upon progress) with goals of supervision to min assist.  He confirms he continues to have support from his spouse and other family members, particularly his grandson, at discharge.  They are able to bring in other caregivers if needed, as they did on previous CIR admit in 2022. WE reviewed need for insurance approval and I will start that request today.   Reche Lowers, PT, DPT Admissions Coordinator [phone] 12/15/23  1:00 PM

## 2023-12-15 NOTE — Progress Notes (Signed)
 Transition of Care [facility]) - Inpatient Brief Assessment   Patient Details  Name: Shane [name] MRN: [id number] Date of Birth: 1941-01-27  [facility] CM/SW Contact:    Rosaline JONELLE Joe, RN Phone Number: 12/15/2023, 2:29 PM   Clinical Narrative: Patient admitted from home with AKI.  CIR is following the patient at this time - per note and insurance authorization will be started by CIR.   Transition of Care Asessment: Insurance and Status: (P) Insurance coverage has been reviewed Patient has primary care physician: (P) Yes Home environment has been reviewed: (P) From home   Prior/Current Home Services: (P) No current home services Social Drivers of Health Review: (P) SDOH reviewed needs interventions Readmission risk has been reviewed: (P) Yes Transition of care needs: (P) transition of care needs identified, TOC will continue to follow

## 2023-12-15 NOTE — Consult Note (Signed)
 Physical Medicine and Rehabilitation Consult Reason for Consult:debility after covid pneumonia Referring Physician: Cindy   HPI: Shane [name] is a 83 y.o. 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  Workup was positive for COVID congestive heart failure as well as acute kidney injury superimposed upon chronic kidney disease.  Troponins were also elevated on admission and this was felt to be secondary to demand ischemia in the setting of his pulmonary issues/COVID.  Patient was placed on azithromycin  and Rocephin  for coverage of his pneumonia and nephrology was consulted for this kidney failure.  He was diuresed for his fluid overload.  Patient was placed on CRRT.  CT of the abdomen on 12/09/2023 demonstrated new active right retroperitoneal bleed.  He was transfused 2 units of packed red blood cells and on 12/12/2023 CT of the abdomen was repeated with slightly larger size of hematoma.  Hemoglobin stable at 9.9 today.  Patient last worked with therapies yesterday and was max assist for sit to stand transfers with poor endurance noted by therapy.  Prior to admission patient was modified independent using a power wheelchair for distances outside the home and he is prosthesis for mobility within his home.  He has a handicapped accessible home with ramp to enter.   Review of Systems  Constitutional:  Positive for malaise/fatigue.  HENT: Negative.    Eyes: Negative.   Respiratory:  Positive for cough and shortness of breath.   Cardiovascular:  Positive for orthopnea and leg swelling.  Gastrointestinal:  Positive for nausea.  Genitourinary:  Positive for dysuria.  Musculoskeletal:  Positive for back pain, joint pain  and myalgias.  Skin: Negative.   Neurological:  Positive for sensory change and weakness.   Past Medical History:  Diagnosis Date   Acute blood loss anemia    Anxiety    AV block, Mobitz 1    Cataract    Chronic kidney disease    d/t DM   CKD (chronic kidney disease), stage III (HCC)    COVID-19 virus infection 07/18/2020   Last Assessment & Plan:   Formatting of this note might be different from the original.  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 10/2019 Echo: EF 55-60%, Gr2 DD. Nl RV.    Neuromuscular disorder (HCC)    Neuropathy associated with endocrine disorder (HCC)    Nonobstructive CAD (coronary artery disease)    a. 2012 Cath: mod, nonobs dzs; b. 10/2016 MV: EF 60%, no ischemia.   OSA on CPAP 05/05/2017   Unattended Home Sleep Test 7/2/813-AHI 38.6/hour, desaturation to 64%, body weight 261 pounds   PONV (postoperative nausea and vomiting)    Postoperative anemia due to acute blood loss  11/07/2020   Postoperative hemorrhagic shock 03/20/2021   Sleep apnea     uses cpap asked to bring mask and tubing   Past Surgical History:  Procedure Laterality Date   ABDOMINAL AORTOGRAM W/LOWER EXTREMITY N/A 12/10/2020   Procedure: ABDOMINAL AORTOGRAM W/LOWER EXTREMITY;  Surgeon: Magda Debby SAILOR, MD;  Location: [facility];  Service: Cardiovascular;  Laterality: N/A;   AMPUTATION Right 01/22/2021   Procedure: 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;  Surgeon: Harden Jerona GAILS, MD;  Location: [facility] OR;  Service: Orthopedics;  Laterality:  Right;   AMPUTATION Right 03/17/2021   Procedure: RIGHT BELOW KNEE AMPUTATION;  Surgeon: Harden Jerona GAILS, MD;  Location: [facility] OR;  Service: Orthopedics;  Laterality: Right;   ANKLE FUSION Right 01/22/2021   Procedure: RIGHT FOOT TIBIOCALCANEAL FUSION;  Surgeon: Harden Jerona GAILS, MD;  Location: [facility] OR;  Service: Orthopedics;  Laterality: Right;   ANTERIOR FUSION CERVICAL SPINE  2012   CARDIAC CATHETERIZATION  2011   CARDIAC CATHETERIZATION N/A 11/09/2016   Procedure: Right Heart Cath;  Surgeon: Victory LELON Sharps, MD;  Location: [facility] INVASIVE CV LAB;  Service: Cardiovascular;  Laterality: N/A;   carpel tunnel     left wrist   CATARACT EXTRACTION     CATARACT EXTRACTION W/ INTRAOCULAR LENS  IMPLANT, BILATERAL  2013   CERVICAL LAMINECTOMY  2012   COLONOSCOPY N/A 01/14/2013   Procedure: COLONOSCOPY;  Surgeon: Norleen SAILOR Kiang, MD;  Location: [facility] ENDOSCOPY;  Service: Endoscopy;  Laterality: N/A;   CORONARY ARTERY BYPASS GRAFT  11/04/2020   LIMA-LAD, SVG-OM1, SVG-PDA (Dr Norleen Exon [facility]) dc 11/18/2020   EYE SURGERY     I & D EXTREMITY Right 02/19/2021   Procedure: RIGHT ANKLE DEBRIDEMENT AND PLACEMENT ANTIBIOTIC BEADS;  Surgeon: Harden Jerona GAILS, MD;  Location: [facility] OR;  Service: Orthopedics;  Laterality: Right;   KNEE SURGERY  1998   left   LEFT HEART CATH AND CORONARY ANGIOGRAPHY N/A 07/10/2020   Procedure: LEFT HEART CATH AND CORONARY ANGIOGRAPHY;  Surgeon: Wonda Sharper, MD;  Location: [facility] INVASIVE CV LAB;  Service: Cardiovascular;  Laterality: N/A;   LUMBAR LAMINECTOMY  2003   LUNG BIOPSY Left 12/26/2016   Procedure: LUNG BIOPSY;  Surgeon: Elspeth JAYSON Millers, MD;  Location: [facility] OR;  Service: Thoracic;  Laterality: Left;   PACEMAKER IMPLANT N/A 03/30/2020   Procedure: PACEMAKER IMPLANT;  Surgeon: Waddell Danelle LELON, MD;  Location: [facility];  Service: Cardiovascular;  Laterality: N/A;   PERIPHERAL VASCULAR INTERVENTION Right 12/10/2020   Procedure: PERIPHERAL VASCULAR INTERVENTION;  Surgeon: Magda Debby SAILOR,  MD;  Location: [facility];  Service: Cardiovascular;  Laterality: Right;  SFA   POSTERIOR FUSION CERVICAL SPINE  2012   PPM GENERATOR REMOVAL N/A 12/14/2020   Procedure: PPM GENERATOR REMOVAL;  Surgeon: Waddell Danelle LELON, MD;  Location: [facility];  Service: Cardiovascular;  Laterality: N/A;   TEE WITHOUT CARDIOVERSION N/A 12/11/2020   Procedure: TRANSESOPHAGEAL ECHOCARDIOGRAM (TEE);  Surgeon: Barbaraann Darryle Debby, MD;  Location: [facility] ENDOSCOPY;  Service: Cardiovascular;  Laterality: N/A;   TRIGGER FINGER RELEASE  2011   4th finger left hand   VIDEO ASSISTED THORACOSCOPY Left 12/26/2016   Procedure: VIDEO ASSISTED THORACOSCOPY;  Surgeon: Elspeth JAYSON Millers, MD;  Location: [facility] OR;  Service: Thoracic;  Laterality: Left;   VIDEO BRONCHOSCOPY N/A 12/26/2016   Procedure: VIDEO BRONCHOSCOPY;  Surgeon: Elspeth JAYSON Millers, MD;  Location: [facility] OR;  Service: Thoracic;  Laterality: N/A;   Family History  Problem Relation Age of  Onset   Diabetes Mellitus II Mother    Emphysema Father 47   Heart attack Father    Colon cancer Neg Hx    Esophageal cancer Neg Hx    Rectal cancer Neg Hx    Stomach cancer Neg Hx    Social History:  reports that he has never smoked. He has never used smokeless tobacco. He reports that he does not drink alcohol and does not use drugs. Allergies:  Allergies  Allergen Reactions   Codeine Hives and Itching   Ofev  [Nintedanib ] Diarrhea    SEVERE DIARRHEA   Pirfenidone  Diarrhea and Other (See Comments)    Esbriet  (Pirfenidone ) causes elevated LFTs. D/C on 06/14/17 and SEVERE DIARRHEA    Medications Prior to Admission  Medication Sig Dispense Refill   albuterol  (PROVENTIL ) (2.5 MG/3ML) 0.083% nebulizer solution Take 2.5 mg by nebulization 2 (two) times daily as needed for wheezing or shortness of breath.     albuterol  (VENTOLIN  HFA) 108 (90 Base) MCG/ACT inhaler Inhale 2 puffs into the lungs every 4 (four) hours as needed for wheezing or shortness of breath.  (Patient taking differently: Inhale 3 puffs into the lungs every 4 (four) hours as needed for wheezing or shortness of breath.) 6.7 g 0   apixaban  (ELIQUIS ) 5 MG TABS tablet Take 1 tablet (5 mg total) by mouth 2 (two) times daily. (Patient taking differently: Take 2.5 mg by mouth 2 (two) times daily.) 60 tablet 11   Ascorbic Acid  (VITAMIN C  WITH ROSE HIPS) 500 MG tablet Take 1 tablet (500 mg total) by mouth daily. 30 tablet 0   atorvastatin  (LIPITOR ) 80 MG tablet TAKE 1 TABLET BY MOUTH EVERY DAY (Patient taking differently: Take 80 mg by mouth at bedtime.) 90 tablet 3   clopidogrel  (PLAVIX ) 75 MG tablet Take 75 mg by mouth daily.     diphenhydrAMINE  HCl, Sleep, (ZZZQUIL PO) Take 15 mLs by mouth at bedtime as needed (cough).     docusate sodium  (COLACE) 100 MG capsule Take 1 capsule (100 mg total) by mouth daily. (Patient taking differently: Take 100 mg by mouth as needed for mild constipation or moderate constipation.) 10 capsule 0   escitalopram  (LEXAPRO ) 10 MG tablet Take 1 tablet (10 mg total) by mouth daily. 30 tablet 0   FARXIGA  10 MG TABS tablet Take 10 mg by mouth daily.     furosemide  (LASIX ) 20 MG tablet Take 3 tablets (60 mg total) by mouth daily. (Patient taking differently: Take 20-40 mg by mouth See admin instructions. Take 2 tablets by mouth in the morning and 1 tablet at night) 90 tablet 0   HUMALOG  KWIKPEN 100 UNIT/ML KwikPen Inject 10-15 Units into the skin 3 (three) times daily. Depending what his blood sugar reading is     HYDROMET 5-1.5 MG/5ML syrup Take 5 mLs by mouth as needed for cough.     lisinopril  (ZESTRIL ) 10 MG tablet Take 10 mg by mouth daily.     LORazepam  (ATIVAN ) 1 MG tablet Take 1 tablet (1 mg total) by mouth at bedtime. (Patient taking differently: Take 1 mg by mouth at bedtime as needed (sleep).) 30 tablet 0   metoprolol  tartrate (LOPRESSOR ) 25 MG tablet Take 25 mg by mouth daily.     Multiple Vitamin (MULTIVITAMIN WITH MINERALS) TABS tablet Take 1 tablet by mouth  daily.     mupirocin  ointment (BACTROBAN ) 2 % Apply 1 Application topically at bedtime.     nitroGLYCERIN  (NITRODUR - DOSED IN MG/24 HR) 0.2 mg/hr patch Place  1 patch (0.2 mg total) onto the skin daily. 30 patch 12   Olopatadine HCl (PATADAY OP) Place 2 drops into both eyes daily as needed (allergies).     ondansetron  (ZOFRAN ) 4 MG tablet Take 4 mg by mouth as needed for nausea or vomiting.     pantoprazole  (PROTONIX ) 40 MG tablet Take 1 tablet (40 mg total) by mouth 2 (two) times daily. (Patient taking differently: Take 40 mg by mouth daily.) 60 tablet 0   polyethylene glycol powder (GLYCOLAX /MIRALAX ) 17 GM/SCOOP powder Take 17 g by mouth every other day.     traZODone  (DESYREL ) 50 MG tablet Take 75 mg by mouth at bedtime as needed for sleep.     TRESIBA  FLEXTOUCH 100 UNIT/ML FlexTouch Pen Inject 20 Units into the skin daily.     zinc  sulfate 220 (50 Zn) MG capsule Take 1 capsule (220 mg total) by mouth daily. 30 capsule 0   gabapentin  (NEURONTIN ) 300 MG capsule TAKE 1 CAPSULE BY MOUTH THREE TIMES A DAY (Patient taking differently: Take 300 mg by mouth 2 (two) times daily.) 180 capsule 3    Home: Home Living Family/patient expects to be discharged to:: Private residence Living Arrangements: Spouse/significant other Available Help at Discharge: Family Type of Home: House Home Access: Ramped entrance Home Layout: Two level, Able to live on main level with bedroom/bathroom Bathroom Shower/Tub: Health Visitor: Handicapped height Home Equipment: Information systems manager, Agricultural Consultant (2 wheels), Art gallery manager, Wheelchair - manual, [employer] - single point, Other (comment) (lift chair, transfer pole beside bed) Additional Comments: `  Functional History: Prior Function Prior Level of Function : Independent/Modified Independent Mobility Comments: Can walk short distances with prosthesis, RW, uses electric scooter quite a bit. 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 States he can navigate stairs with a rail at baseline. ADLs Comments: Mod I with ADLs. Wife does IADLs Functional Status:  Mobility: Bed Mobility Overal bed mobility: Needs Assistance Bed Mobility: Supine to Sit, Sit to Supine Supine to sit: Contact guard Sit to supine: Contact guard assist, Used rails General bed mobility comments: CGA for safety, improved bed mobility today, using rail as needed. Transfers Overall transfer level: Needs assistance Equipment used: Ambulation equipment used Transfers: Sit to/from Stand Sit to Stand: Max assist, +2 safety/equipment, +2 physical assistance, From elevated surface Transfer via Lift Equipment: Stedy General transfer comment: Max A + 2 for boost to stand using Stedy today. Completed 2 times using bed pad to lift hips from bed. Max Cues for knee and hip extension. Fatigues rapidly. Ambulation/Gait General Gait Details: Deferred, awaiting prosthesis, pt does not feel comfortable standing on LLE without Rt prosthesis due to Lt foot wound and weakness.    ADL: ADL Overall ADL's : Needs assistance/impaired Eating/Feeding: Independent Grooming: Set up, Sitting Upper Body Bathing: Set up, Sitting Lower Body Bathing: Minimal assistance, Bed level, Sitting/lateral leans Upper Body Dressing : Set up, Sitting Lower Body Dressing: Maximal assistance, Sitting/lateral leans, Bed level Lower Body Dressing Details (indicate cue type and reason): to don sleeve and attempt to don R prosthetic Toileting- Clothing Manipulation and Hygiene: Moderate assistance, Sitting/lateral lean, Bed level General ADL Comments: Focus on attempts for OOB transfers  Cognition: Cognition Overall Cognitive Status: Within Functional Limits for tasks assessed Orientation Level: Oriented X4 Cognition Arousal: Alert Behavior During Therapy: WFL for tasks assessed/performed Overall Cognitive Status: Within Functional Limits for tasks assessed General Comments:  Slight delay with responses at times.  Blood pressure 137/72, pulse 71, temperature  97.9 F (36.6 C), temperature source Oral, resp. rate 16, height 6' (1.829 m), weight 107.1 kg, SpO2 (!) 85%. Physical Exam Constitutional:      General: He is not in acute distress.    Appearance: He is obese.  HENT:     Head: Normocephalic and atraumatic.     Mouth/Throat:     Pharynx: No oropharyngeal exudate.  Eyes:     Pupils: Pupils are equal, round, and reactive to light.  Cardiovascular:     Rate and Rhythm: Normal rate.  Pulmonary:     Effort: Pulmonary effort is normal.  Abdominal:     Palpations: Abdomen is soft.  Musculoskeletal:        General: Swelling and deformity present.     Cervical back: Normal range of motion.     Comments: Right BKA slightly swollen. Left TMA swollen as well. Both quads tender to basic palpation.   Skin:    Comments: Breakdown along lateral left foot, foam dressing in place  Neurological:     Mental Status: He is alert.     Comments: Alert and oriented x 3. Normal insight and awareness. Intact Memory. Normal language and speech. Cranial nerve exam unremarkable. MMT: UE 3+-4+/5 prox to distal. RLE 2/5 HF,KE and LLE 2 to 2+ HF, KE and 3/5 ADF/PF. Decreased sensation distally LLE.    Psychiatric:        Mood and Affect: Mood normal.        Behavior: Behavior normal.     Results for orders placed or performed during the hospital encounter of 12/06/23 (from the past 24 hours)  Glucose, capillary     Status: Abnormal   Collection Time: 12/14/23 11:58 AM  Result Value Ref Range   Glucose-Capillary 353 (H) 70 - 99 mg/dL  Glucose, capillary     Status: Abnormal   Collection Time: 12/14/23  4:54 PM  Result Value Ref Range   Glucose-Capillary 298 (H) 70 - 99 mg/dL  Glucose, capillary     Status: Abnormal   Collection Time: 12/14/23  8:52 PM  Result Value Ref Range   Glucose-Capillary 223 (H) 70 - 99 mg/dL  Renal function panel (daily at 0500)     Status:  Abnormal   Collection Time: 12/15/23  8:02 AM  Result Value Ref Range   Sodium 134 (L) 135 - 145 mmol/L   Potassium 4.4 3.5 - 5.1 mmol/L   Chloride 92 (L) 98 - 111 mmol/L   CO2 32 22 - 32 mmol/L   Glucose, Bld 327 (H) 70 - 99 mg/dL   BUN 56 (H) 8 - 23 mg/dL   Creatinine, Ser 8.71 (H) 0.61 - 1.24 mg/dL   Calcium  8.0 (L) 8.9 - 10.3 mg/dL   Phosphorus 2.7 2.5 - 4.6 mg/dL   Albumin  2.4 (L) 3.5 - 5.0 g/dL   GFR, Estimated 56 (L) >60 mL/min   Anion gap 10 5 - 15  Magnesium      Status: None   Collection Time: 12/15/23  8:02 AM  Result Value Ref Range   Magnesium  1.7 1.7 - 2.4 mg/dL  CBC     Status: Abnormal   Collection Time: 12/15/23  8:02 AM  Result Value Ref Range   WBC 10.7 (H) 4.0 - 10.5 K/uL   RBC 3.54 (L) 4.22 - 5.81 MIL/uL   Hemoglobin 9.9 (L) 13.0 - 17.0 g/dL   HCT 69.8 (L) 60.9 - 47.9 %   MCV 85.0 80.0 - 100.0 fL   MCH 28.0 26.0 -  34.0 pg   MCHC 32.9 30.0 - 36.0 g/dL   RDW 83.8 (H) 88.4 - 84.4 %   Platelets 172 150 - 400 K/uL   nRBC 0.2 0.0 - 0.2 %  Glucose, capillary     Status: Abnormal   Collection Time: 12/15/23  8:27 AM  Result Value Ref Range   Glucose-Capillary 355 (H) 70 - 99 mg/dL   *Note: Due to a large number of results and/or encounters for the requested time period, some results have not been displayed. A complete set of results can be found in Results Review.   No results found.  Assessment/Plan: Diagnosis: 83 year old male with history of right BKA/left TMA now deconditioned after being admitted for COVID-pneumonia and congestive heart failure.  Course additionally complicated by renal failure and retroperitoneal bleed. His profound hip girdle weakness suspicious for critical illness myopathy.  Does the need for close, 24 hr/day medical supervision in concert with the patient's rehab needs make it unreasonable for this patient to be served in a less intensive setting? Yes Co-Morbidities requiring supervision/potential complications:  -AKI on  CKD -CAD -A fib -Acute blood loss anemia -Morbid obesity and obstructive sleep apnea  Due to bladder management, bowel management, safety, skin/wound care, disease management, medication administration, pain management, and patient education, does the patient require 24 hr/day rehab nursing? Yes Does the patient require coordinated care of a physician, rehab nurse, therapy disciplines of PT, OT to address physical and functional deficits in the context of the above medical diagnosis(es)? Yes Addressing deficits in the following areas: balance, endurance, locomotion, strength, transferring, bowel/bladder control, bathing, dressing, feeding, grooming, toileting, and psychosocial support Can the patient actively participate in an intensive therapy program of at least 3 hrs of therapy per day at least 5 days per week? Yes The potential for patient to make measurable gains while on inpatient rehab is excellent Anticipated functional outcomes upon discharge from inpatient rehab are supervision and min assist  with PT, supervision and min assist with OT, n/a with SLP. Estimated rehab length of stay to reach the above functional goals is: 17-24 days Anticipated discharge destination: Home Overall Rehab/Functional Prognosis: good  POST ACUTE RECOMMENDATIONS: This patient's condition is appropriate for continued rehabilitative care in the following setting: CIR Patient has agreed to participate in recommended program. Yes Note that insurance prior authorization may be required for reimbursement for recommended care.  Comment: Pt was independent with his right BKA prosthesis and scooter prior to admit. 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 In rehab he could utilize his prosthesis to help with transfers, but I'm not sure he will be able to utilize it for functional gait until he's home given proximal muscle weakness. Rehab Admissions Coordinator to follow  up.     MEDICAL RECOMMENDATIONS: Continue regular wearing of shrinkers, volume management so that he's able to fit into prosthesis again.  Encouraged him to be up OOB to chair as much as possible during the day.    I have personally performed a face to face diagnostic evaluation of this patient. Additionally, I have examined the patient's medical record including any pertinent labs and radiographic images. If the physician assistant has documented in this note, I have reviewed and edited or otherwise concur with the physician assistant's documentation.  Thanks,  Arthea ONEIDA Gunther, MD 12/15/2023

## 2023-12-15 NOTE — Progress Notes (Signed)
 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 Geronimo, who presented to the emergency department on 12/06/2023 with shortness of breath. Per the ED provider complaining of cough and shortness of breath over the past 3 days. Using his nebulizers at home with no improvement in his symptoms. EMS was called and found to be hypotensive and hypoxic. He was started on nonrebreather and an epinephrine  infusion prior to arrival. In the ED afebrile, SBP around 100, tachypneic. Labs +COVID, AKI on CKD with BUN/Cr 116, 4.69 with critical K of 7.1. bicarb 16. CBC without leukocytosis, hgb 9.5. lactic 2.3>1.9, troponin 8,290, UA negative, blood cultures pending. CXR with likely pulmonary edema. With new AKI, hyperkalemia and EKG changes, nephrology was consulted. Patient given lokelma , albuterol , Lasix , sodium bicarb, 5U insulin  with D50, calcium  gluconate. Was also given azithromycin /rocephin  CAP coverage. Neprhology recommended albumin  for BP support (initially on 2mcg levo, now off), Lasix  80mg  IV, renal ultrasound. Pending repeat BMP on consult for admission to ICU.     Significant events: 1/1: Admit to CCM for covid pneumonia, aki with hyperK likely needing CRRT  Renal consultation -presented with a creatinine of 4.  7 mg percent.  [Baseline July 2024 was 1.8 mg percent] 1/2: CRRT initiated, Card Consult due to elev Troponin (Nstemi vs Demand Ischemia)  Cardiology consultation -ejection fraction 40% compared to baseline 55%. 1/3:  Continuing CRRT till this afternoon, PCCM sign off this afternoon, trx to [facility] tomorrow  -> heparin  being continuedFor total 48 hours.  Cardiology recommended continuing Plavix  but holding aspirin .  Cardiology recommending Toprol  once blood pressure stabilized.      Significant studies: 1/1 admission CXR: bilateral infiltrates c/w CHF 1/1 US  renal: Unremarkable  1/2 Echo: EF 40%, RWMA noted, reduced RVSF, moderate MR, TAVR 1/4 CT abdomen and pelvis: new active right RP bleed, chronic ILD   Significant microbiology data: 1/1 COVID + 1/1 Blood cx x2: NG    Procedures: 1/2 CRRT    Consults: Nephrology General Surgery  IR Gastroenterology   Assessment and Plan: #1 acute on chronic hypoxic respiratory failure likely multifactorial secondary to acute COVID-19 infection, in the setting of pulmonary edema secondary to acute systolic CHF exacerbation in the setting of underlying pulmonary fibrosis -Patient noted to be on baseline O2 2 L nasal cannula at bedtime -Patient noted to have presented with cough, shortness of breath x 3 days with no improvement on home nebulizers. -EMS noted on presentation patient to be hypoxic. -Patient states wife and grandson had been sick with COVID infection recently. -MRSA PCR negative. -SARS coronavirus 2 PCR positive, influenza A and B by PCR negative. -Initial chest x-ray findings concerning for CHF exacerbation. -Patient initially received a dose of IV azithromycin  and Rocephin  x 1. -Patient status post 3-day course of remdesivir  and currently on Decadron . -Patient also received IV Lasix  with diuresis. -Patient also noted to have undergone CRRT during the hospitalization. -weaned o2 to RA -Hycodan as needed. -Patient with clinical improvement. HD tunneled cath removed per Nephro -now on oral bid lasix    2.  Non-STEMI -Patient on  admission noted to have elevated troponins peaking at 8-9 0 and subsequently trending down. -Felt likely secondary  to demand ischemia in the setting of COVID-19 infection with underlying cardiomyopathy CAD/history of CABG. -Patient denies any chest pain. -2D echo obtained with EF of 40%,WMA, moderately dilated left atrial size, mildly dilated right atrial size, status post bioprosthetic aortic valve/status post TAVR. -Patient seen in consultation by cardiology who discussed with patient and decision made to treat medically with IV heparin  for 48 hours and continue medical therapy. -IV heparin  discontinued due to spontaneous retroperitoneal bleed. -Discussed with Cardiology , although not ideal, ok to hold asa as well at this point given bleeding risk   3.  Acute retroperitoneal bleed -Noted on CT abdomen and pelvis with contrast 12/09/2023. -Patient status post transfusion of 1 unit platelets, 2 units PRBCs, thiamine, DDAVP . -Patient noted to have started on aspirin  which has subsequently been discontinued. -Anticoagulation for thromboembolic prophylaxis given A-fib on hold due to risk of bleeding. -Hemoglobin currently at 9.1 from 7.0 after transfusion of 2 units PRBCs on 12/12/2023.   -Hemoglobin noted to have been 8.0 on 12/11/2023. -Due to presentation with non-STEMI and cardiac history patient was transfused 2 units PRBCs on 12/12/2023.  -Repeat CT abdomen and pelvis (12/12/2023 ), with redemonstrated large right retroperitoneal hematoma with hematocrit level.  Hematoma measures slightly larger compared to prior exam.  Increased bilateral pleural effusions with hazy edema or infiltrates at the lung bases.  Cardiomegaly.   -IR following.  -Hgb stable   4.  Paroxysmal A-fib -Currently rate controlled. -Anticoagulation on hold secondary to retroperitoneal bleed as risk of bleeding outweighs benefits at this time and per cardiology recommendations. -Outpatient follow-up with primary cardiologist for possible evaluation for Watchman versus retrial per cardiology. -Per cardiology.   5.  History of complete heart  block/SSS/status post pacemaker implantation -Stable. -Per cardiology.   6.  Hyperlipidemia -Statin.   7.  CAD status post CABG/status post TAVR -Patient noted to have last dose of Plavix  12/08/2023. -Patient was to be started on aspirin  12/11/2023 per cardiology recommendations however due to drop in hemoglobin aspirin  discontinued. -Per cardiology.   8.  Acute kidney injury -Felt likely hemodynamically mediated from combination of CHF exacerbation, acute infection with evolution to ATN in the setting of ACE inhibitor -Patient being followed by nephrology and underwent CRRT 1/2-1/3. -CRRT paused but restarted 12/09/2023 in the setting of retroperitoneal bleed and stopped on 12/11/2023. -Patient with urine output of 2.790 L over the past 24 hours. -Creatinine currently at 1.86 today from 2.22 yesterday. -Continue to hold ACE inhibitor -Per nephrology.   9.  Hyperkalemia -Likely secondary to problem #8 -Resolved.  Potassium of 4.0. -Lokelma  discontinued. -Ensure changed to nephro per nephrology. -Per nephrology.   10.  Anion gap metabolic acidosis -Likely secondary to acute kidney injury. -Improved with CRRT. -Per nephrology.   11.  Shock -Not largely cardiogenic but cannot rule out a septic component.-Patien -Status post dose of antibiotic coverage for community-acquired pneumonia and status post treatment for COVID infection. -Shock resolved.   12.  Normocytic anemia/acute blood loss anemia -Likely secondary to retroperitoneal bleed and acute illness. -See above, retroperitoneal bleed. -Not ideal, but ok to hold ASA as well as anticoagulation at this time given increased risk of life threatening bleeding   13.  Dysphagia -Patient seen in consultation by GI, patient noted to have some intermittent dysphagia mostly to solids, dense bread and meat occasionally to liquids. -GI and patient deferring any procedures at this point  and GI recommending patient will benefit from EGD for  evaluation of possible esophageal dilatation based on his symptoms in the outpatient setting which will be arranged. -Barium esophagram ordered and pending.   14.  Diabetes mellitus type 2 -Hemoglobin A1c 8.5 (12/06/2023) -Patient noted to be on Farxiga , Tresiba  20 units daily, prior to admission. -Patient presenting acute kidney injury, Farxiga  on hold. -Patient also noted on Decadron . -CBG 247 this morning. -on 20 units semglee  with 3 units meal coverage. Glucose remains poorly controlled -Increase semglee  to 25 units, continue SSI, Neurontin .       Subjective: Eager for rehab  Physical Exam: Vitals:   12/15/23 0055 12/15/23 0616 12/15/23 0829 12/15/23 1145  BP: 124/60 135/66 137/72 109/83  Pulse: 60 72 71 61  Resp: 19 19 16 16   Temp: 98.3 F (36.8 C) 97.6 F (36.4 C) 97.9 F (36.6 C) 97.7 F (36.5 C)  TempSrc: Oral Oral Oral   SpO2: 99% 98% (!) 85% 98%  Weight:  107.1 kg    Height:       General exam: Conversant, in no acute distress Respiratory system: normal chest rise, clear, no audible wheezing Cardiovascular system: regular rhythm, s1-s2 Gastrointestinal system: Nondistended, nontender, pos BS Central nervous system: No seizures, no tremors Extremities: No cyanosis, no joint deformities Skin: No rashes, no pallor Psychiatry: Affect normal // no auditory hallucinations   Data Reviewed:  Labs reviewed: Na 134, K 4.4, Cr 1.28, WBC 10.7, Hgb 9.9  Family Communication: Pt in room, family at bedside  Disposition: Status is: Inpatient Remains inpatient appropriate because: severity of illness  Planned Discharge Destination: Rehab    Author: Garnette Pelt, MD 12/15/2023 2:08 PM  For on call review [email or web address].

## 2023-12-15 NOTE — Plan of Care (Signed)
   Problem: Education: Goal: Knowledge of risk factors and measures for prevention of condition will improve Outcome: Progressing   Problem: Coping: Goal: Psychosocial and spiritual needs will be supported Outcome: Progressing   Problem: Respiratory: Goal: Will maintain a patent airway Outcome: Progressing Goal: Complications related to the disease process, condition or treatment will be avoided or minimized Outcome: Progressing   Problem: Education: Goal: Ability to describe self-care measures that may prevent or decrease complications (Diabetes Survival Skills Education) will improve Outcome: Progressing Goal: Individualized Educational Video(s) Outcome: Progressing   Problem: Coping: Goal: Ability to adjust to condition or change in health will improve Outcome: Progressing   Problem: Fluid Volume: Goal: Ability to maintain a balanced intake and output will improve Outcome: Progressing   Problem: Health Behavior/Discharge Planning: Goal: Ability to identify and utilize available resources and services will improve Outcome: Progressing Goal: Ability to manage health-related needs will improve Outcome: Progressing   Problem: Metabolic: Goal: Ability to maintain appropriate glucose levels will improve Outcome: Progressing   Problem: Nutritional: Goal: Maintenance of adequate nutrition will improve Outcome: Progressing Goal: Progress toward achieving an optimal weight will improve Outcome: Progressing   Problem: Skin Integrity: Goal: Risk for impaired skin integrity will decrease Outcome: Progressing   Problem: Tissue Perfusion: Goal: Adequacy of tissue perfusion will improve Outcome: Progressing   Problem: Education: Goal: Knowledge of General Education information will improve Description: Including pain rating scale, medication(s)/side effects and non-pharmacologic comfort measures Outcome: Progressing   Problem: Health Behavior/Discharge Planning: Goal:  Ability to manage health-related needs will improve Outcome: Progressing   Problem: Clinical Measurements: Goal: Ability to maintain clinical measurements within normal limits will improve Outcome: Progressing Goal: Will remain free from infection Outcome: Progressing Goal: Diagnostic test results will improve Outcome: Progressing Goal: Respiratory complications will improve Outcome: Progressing Goal: Cardiovascular complication will be avoided Outcome: Progressing   Problem: Activity: Goal: Risk for activity intolerance will decrease Outcome: Progressing   Problem: Nutrition: Goal: Adequate nutrition will be maintained Outcome: Progressing   Problem: Coping: Goal: Level of anxiety will decrease Outcome: Progressing   Problem: Elimination: Goal: Will not experience complications related to bowel motility Outcome: Progressing Goal: Will not experience complications related to urinary retention Outcome: Progressing   Problem: Pain Management: Goal: General experience of comfort will improve Outcome: Progressing   Problem: Safety: Goal: Ability to remain free from injury will improve Outcome: Progressing   Problem: Skin Integrity: Goal: Risk for impaired skin integrity will decrease Outcome: Progressing

## 2023-12-15 NOTE — Plan of Care (Signed)
 Nephrology brief note.  Chart reviewed.  Patient not seen.    AKI has resolved.  Yesterday I requested nontunneled catheter removal.  I have requested outpatient follow-up with nephrology in 2-3 weeks.  Have started back oral lasix  at 40 mg PO BID to start tomorrow (a slight increase from his home regimen).  Additional titration per primary team   Please reach out if any questions.    Katheryn JAYSON Saba, MD 9:50 AM 12/15/2023

## 2023-12-16 ENCOUNTER — Inpatient Hospital Stay (HOSPITAL_COMMUNITY): Payer: Medicare Other

## 2023-12-16 DIAGNOSIS — I509 Heart failure, unspecified: Secondary | ICD-10-CM | POA: Diagnosis not present

## 2023-12-16 DIAGNOSIS — E875 Hyperkalemia: Secondary | ICD-10-CM | POA: Diagnosis not present

## 2023-12-16 DIAGNOSIS — N179 Acute kidney failure, unspecified: Secondary | ICD-10-CM | POA: Diagnosis not present

## 2023-12-16 LAB — RENAL FUNCTION PANEL
Albumin: 2.6 g/dL — ABNORMAL LOW (ref 3.5–5.0)
Anion gap: 10 (ref 5–15)
BUN: 54 mg/dL — ABNORMAL HIGH (ref 8–23)
CO2: 32 mmol/L (ref 22–32)
Calcium: 8.1 mg/dL — ABNORMAL LOW (ref 8.9–10.3)
Chloride: 89 mmol/L — ABNORMAL LOW (ref 98–111)
Creatinine, Ser: 1.28 mg/dL — ABNORMAL HIGH (ref 0.61–1.24)
GFR, Estimated: 56 mL/min — ABNORMAL LOW (ref >60)
Glucose, Bld: 332 mg/dL — ABNORMAL HIGH (ref 70–99)
Phosphorus: 3.2 mg/dL (ref 2.5–4.6)
Potassium: 4.3 mmol/L (ref 3.5–5.1)
Sodium: 131 mmol/L — ABNORMAL LOW (ref 135–145)

## 2023-12-16 LAB — MAGNESIUM: Magnesium: 1.4 mg/dL — ABNORMAL LOW (ref 1.7–2.4)

## 2023-12-16 LAB — GLUCOSE, CAPILLARY
Glucose-Capillary: 310 mg/dL — ABNORMAL HIGH (ref 70–99)
Glucose-Capillary: 381 mg/dL — ABNORMAL HIGH (ref 70–99)
Glucose-Capillary: 356 mg/dL — ABNORMAL HIGH (ref 70–99)
Glucose-Capillary: 211 mg/dL — ABNORMAL HIGH (ref 70–99)

## 2023-12-16 MED ORDER — LORAZEPAM 2 MG/ML IJ SOLN: 0.2500 mg | INTRAMUSCULAR | Status: DC | PRN

## 2023-12-16 MED ORDER — LORAZEPAM 1 MG PO TABS
1.0000 mg | ORAL_TABLET | Freq: Every evening | ORAL | Status: DC | PRN
  Administered 2023-12-16 – 2023-12-25 (×7): 1 mg via ORAL
  Filled 2023-12-16 (×7): qty 1

## 2023-12-16 MED ORDER — TRAZODONE HCL 50 MG PO TABS
75.0000 mg | ORAL_TABLET | Freq: Every day | ORAL | Status: DC
  Administered 2023-12-16 – 2023-12-29 (×14): 75 mg via ORAL
  Filled 2023-12-16 (×14): qty 2

## 2023-12-16 NOTE — Plan of Care (Signed)
   Problem: Education: Goal: Knowledge of risk factors and measures for prevention of condition will improve Outcome: Progressing   Problem: Coping: Goal: Psychosocial and spiritual needs will be supported Outcome: Progressing   Problem: Respiratory: Goal: Will maintain a patent airway Outcome: Progressing Goal: Complications related to the disease process, condition or treatment will be avoided or minimized Outcome: Progressing   Problem: Education: Goal: Ability to describe self-care measures that may prevent or decrease complications (Diabetes Survival Skills Education) will improve Outcome: Progressing Goal: Individualized Educational Video(s) Outcome: Progressing   Problem: Coping: Goal: Ability to adjust to condition or change in health will improve Outcome: Progressing   Problem: Fluid Volume: Goal: Ability to maintain a balanced intake and output will improve Outcome: Progressing   Problem: Health Behavior/Discharge Planning: Goal: Ability to identify and utilize available resources and services will improve Outcome: Progressing Goal: Ability to manage health-related needs will improve Outcome: Progressing   Problem: Metabolic: Goal: Ability to maintain appropriate glucose levels will improve Outcome: Progressing   Problem: Nutritional: Goal: Maintenance of adequate nutrition will improve Outcome: Progressing Goal: Progress toward achieving an optimal weight will improve Outcome: Progressing   Problem: Skin Integrity: Goal: Risk for impaired skin integrity will decrease Outcome: Progressing   Problem: Tissue Perfusion: Goal: Adequacy of tissue perfusion will improve Outcome: Progressing   Problem: Education: Goal: Knowledge of General Education information will improve Description: Including pain rating scale, medication(s)/side effects and non-pharmacologic comfort measures Outcome: Progressing   Problem: Health Behavior/Discharge Planning: Goal:  Ability to manage health-related needs will improve Outcome: Progressing   Problem: Clinical Measurements: Goal: Ability to maintain clinical measurements within normal limits will improve Outcome: Progressing Goal: Will remain free from infection Outcome: Progressing Goal: Diagnostic test results will improve Outcome: Progressing Goal: Respiratory complications will improve Outcome: Progressing Goal: Cardiovascular complication will be avoided Outcome: Progressing   Problem: Activity: Goal: Risk for activity intolerance will decrease Outcome: Progressing   Problem: Nutrition: Goal: Adequate nutrition will be maintained Outcome: Progressing   Problem: Coping: Goal: Level of anxiety will decrease Outcome: Progressing   Problem: Elimination: Goal: Will not experience complications related to bowel motility Outcome: Progressing Goal: Will not experience complications related to urinary retention Outcome: Progressing   Problem: Pain Management: Goal: General experience of comfort will improve Outcome: Progressing   Problem: Safety: Goal: Ability to remain free from injury will improve Outcome: Progressing   Problem: Skin Integrity: Goal: Risk for impaired skin integrity will decrease Outcome: Progressing

## 2023-12-16 NOTE — Progress Notes (Signed)
 Physical Therapy Treatment Patient Details Name: Shane [name] MRN: [id number] DOB: 1941-05-28 Today's Date: 12/16/2023   History of Present Illness 83 year old male presented to ED 1/1 with shortness of breath.  Dx: Covid 19 PNA, Acute kidney injury, Acute hypoxic respiratory failure, Normocytic Anemia - secondary to RP bleed and critical illness, Acute HF with reduced EF, s/p CRRT. With significant past medical history including obstructive sleep apnea on CPAP at night, CAD s/p CABG x3 in 2021, aortic stenosis s/p TAVR 08/2023 at [facility], HFpEF, atrial fibrillation on Eliquis , plavix , sick sinus syndrome s/p leadless PPM, hypertension, hyperlipidemia, GERD, uncontrolled diabetes, CKD 3, pulmonary fibrosis    PT Comments  Good progress today, able to stand with mod assist +1 using stedy. Successfully able to donne RLE prosthesis (albeit challenging.) Edema reducing nicely, and has been diligent wearing shrinker sock. Pt sill fatigues quickly, each stand tolerated approx 20 seconds. Emphasized weight shifting for pre-gait and standing balance control. Has some pain in Lt knee with WB. Reviewed additional LE exercises, emphasized proximal muscle use which appears to be stronger today. Encouraged OOB with staff using [facility] tomorrow. Returned to bed today because he is awaiting an enema shortly. Patient will continue to benefit from skilled physical therapy services to further improve independence with functional mobility. Patient will benefit from intensive inpatient follow up therapy, >3 hours/day.     If plan is discharge home, recommend the following: A lot of help with walking and/or transfers;A lot of help with bathing/dressing/bathroom;Assistance with cooking/housework;Assist for transportation;Help with stairs or ramp for entrance   Can travel by private vehicle        Equipment Recommendations  Other (comment) (TBD next venue)    Recommendations for Other Services Rehab consult      Precautions / Restrictions Precautions Precautions: Fall Precaution Comments: Lt disolateral foot wound. Prior Rt BKA. Wears 2 L O2 at night Required Braces or Orthoses: Other Brace Other Brace: RLE prosthesis. Needs to continue to wear shrinker Restrictions Weight Bearing Restrictions Per Provider Order: No     Mobility  Bed Mobility Overal bed mobility: Needs Assistance Bed Mobility: Supine to Sit, Sit to Supine     Supine to sit: Contact guard Sit to supine: Contact guard assist, Used rails   General bed mobility comments: CGA for safety, improved bed mobility today, using rail as needed. No physical assist needed.    Transfers Overall transfer level: Needs assistance Equipment used: Ambulation equipment used Transfers: Sit to/from Stand Sit to Stand: Mod assist, From elevated surface           General transfer comment: Mod assist for boost to stand from elevated bed surface using Stedy. Pt able to perform second trial from perched position on stedy paddles, again with mod assist for boost. Cues for quick extension of knees and hips. Good pull through UEs. Pt reports Lt knee pain with WB. Transfer via Lift Equipment: Stedy  Ambulation/Gait               General Gait Details: Deferred, unable yet   Stairs             Wheelchair Mobility     Tilt Bed    Modified Rankin (Stroke Patients Only)       Balance Overall balance assessment: Needs assistance Sitting-balance support: No upper extremity supported, Feet supported Sitting balance-Leahy Scale: Fair     Standing balance support: Bilateral upper extremity supported, Reliant on assistive device for balance Standing balance-Leahy Scale: Poor Standing balance comment:  Stands without knees supported twice today, each held approx 20 seconds.                            Cognition Arousal: Alert Behavior During Therapy: WFL for tasks assessed/performed Overall Cognitive Status:  Within Functional Limits for tasks assessed                                          Exercises General Exercises - Lower Extremity Ankle Circles/Pumps: AROM, Left, 10 reps, Supine Quad Sets: Strengthening, Both, 10 reps, Supine Gluteal Sets: Strengthening, Both, 10 reps, Supine Hip ABduction/ADduction: Strengthening, Both, 10 reps, Supine Hip Flexion/Marching: Strengthening, Both, 10 reps, Supine Other Exercises Other Exercises: Standing balance, pre-gait weight shift, A/P rocking. unlocking knees and unbracing from pad on Stedy.    General Comments General comments (skin integrity, edema, etc.): HR 99.      Pertinent Vitals/Pain Pain Assessment Pain Assessment: Faces Faces Pain Scale: Hurts little more Pain Location: Lt thigh and knee Pain Descriptors / Indicators: Sore, Grimacing Pain Intervention(s): Monitored during session    Home Living                          Prior Function            PT Goals (current goals can now be found in the care plan section) Acute Rehab PT Goals Patient Stated Goal: Get well, go home PT Goal Formulation: With patient/family Time For Goal Achievement: 12/26/23 Potential to Achieve Goals: Good Progress towards PT goals: Progressing toward goals    Frequency    Min 1X/week      PT Plan      Co-evaluation              AM-PAC PT 6 Clicks Mobility   Outcome Measure  Help needed turning from your back to your side while in a flat bed without using bedrails?: A Little Help needed moving from lying on your back to sitting on the side of a flat bed without using bedrails?: A Little Help needed moving to and from a bed to a chair (including a wheelchair)?: A Lot Help needed standing up from a chair using your arms (e.g., wheelchair or bedside chair)?: A Lot Help needed to walk in hospital room?: Total Help needed climbing 3-5 steps with a railing? : Total 6 Click Score: 12    End of Session  Equipment Utilized During Treatment: Gait belt Activity Tolerance: Patient tolerated treatment well Patient left: in bed;with call bell/phone within reach;with family/visitor present;with bed alarm set   PT Visit Diagnosis: Muscle weakness (generalized) (M62.81);Difficulty in walking, not elsewhere classified (R26.2);Pain Pain - Right/Left: Right Pain - part of body:  (flank and abdomen)     Time: 8647-8575 PT Time Calculation (min) (ACUTE ONLY): 32 min  Charges:    $Therapeutic Exercise: 8-22 mins $Therapeutic Activity: 8-22 mins PT General Charges $$ ACUTE PT VISIT: 1 Visit                     Leontine Roads, PT, DPT [facility]  Rehabilitation Services Physical Therapist Office: [phone] Website: [email or web address]    Leontine GORMAN Roads 12/16/2023, 2:57 PM

## 2023-12-16 NOTE — Progress Notes (Signed)
 Progress Note   Patient: Shane [name] FMW:[id number] DOB: 13-Nov-1941 DOA: 12/06/2023     10 DOS: the patient was seen and examined on 12/16/2023   Brief hospital course: 83 year old male with significant past medical history including obstructive sleep apnea on CPAP at night, CAD s/p CABG x3 in 2021, aortic stenosis s/p TAVR 08/2023 at [facility], HFpEF, atrial fibrillation on Eliquis , plavix , sick sinus syndrome s/p leadless PPM, hypertension, hyperlipidemia, GERD, uncontrolled diabetes, CKD 3, pulmonary fibrosis followed by Dr. Geronimo, who presented to the emergency department on 12/06/2023 with shortness of breath. Per the ED provider complaining of cough and shortness of breath over the past 3 days. Using his nebulizers at home with no improvement in his symptoms. EMS was called and found to be hypotensive and hypoxic. He was started on nonrebreather and an epinephrine  infusion prior to arrival. In the ED afebrile, SBP around 100, tachypneic. Labs +COVID, AKI on CKD with BUN/Cr 116, 4.69 with critical K of 7.1. bicarb 16. CBC without leukocytosis, hgb 9.5. lactic 2.3>1.9, troponin 8,290, UA negative, blood cultures pending. CXR with likely pulmonary edema. With new AKI, hyperkalemia and EKG changes, nephrology was consulted. Patient given lokelma , albuterol , Lasix , sodium bicarb, 5U insulin  with D50, calcium  gluconate. Was also given azithromycin /rocephin  CAP coverage. Neprhology recommended albumin  for BP support (initially on 2mcg levo, now off), Lasix  80mg  IV, renal ultrasound. Pending repeat BMP on consult for admission to ICU.     Significant events: 1/1: Admit to CCM for covid pneumonia, aki with hyperK likely needing CRRT  Renal consultation -presented with a creatinine of 4.  7 mg percent.  [Baseline July 2024 was 1.8 mg percent] 1/2: CRRT initiated, Card Consult due to elev Troponin (Nstemi vs Demand Ischemia)  Cardiology consultation -ejection fraction 40% compared to baseline 55%. 1/3:  Continuing CRRT till this afternoon, PCCM sign off this afternoon, trx to [facility] tomorrow  -> heparin  being continuedFor total 48 hours.  Cardiology recommended continuing Plavix  but holding aspirin .  Cardiology recommending Toprol  once blood pressure stabilized.      Significant studies: 1/1 admission CXR: bilateral infiltrates c/w CHF 1/1 US  renal: Unremarkable  1/2 Echo: EF 40%, RWMA noted, reduced RVSF, moderate MR, TAVR 1/4 CT abdomen and pelvis: new active right RP bleed, chronic ILD   Significant microbiology data: 1/1 COVID + 1/1 Blood cx x2: NG    Procedures: 1/2 CRRT    Consults: Nephrology General Surgery  IR Gastroenterology   Assessment and Plan: #1 acute on chronic hypoxic respiratory failure likely multifactorial secondary to acute COVID-19 infection, in the setting of pulmonary edema secondary to acute systolic CHF exacerbation in the setting of underlying pulmonary fibrosis -Patient noted to be on baseline O2 2 L nasal cannula at bedtime -Patient noted to have presented with cough, shortness of breath x 3 days with no improvement on home nebulizers. -EMS noted on presentation patient to be hypoxic. -Patient states wife and grandson had been sick with COVID infection recently. -MRSA PCR negative. -SARS coronavirus 2 PCR positive, influenza A and B by PCR negative. -Initial chest x-ray findings concerning for CHF exacerbation. -Patient initially received a dose of IV azithromycin  and Rocephin  x 1. -Patient status post 3-day course of remdesivir  and currently on Decadron . -Patient also received IV Lasix  with diuresis. -Patient also noted to have undergone CRRT during the hospitalization. -weaned o2 to RA -Hycodan as needed. -Patient with clinical improvement. HD tunneled cath removed per Nephro -now on oral bid lasix    2.  Non-STEMI -Patient on  admission noted to have elevated troponins peaking at 8-9 0 and subsequently trending down. -Felt likely secondary  to demand ischemia in the setting of COVID-19 infection with underlying cardiomyopathy CAD/history of CABG. -Patient denies any chest pain. -2D echo obtained with EF of 40%,WMA, moderately dilated left atrial size, mildly dilated right atrial size, status post bioprosthetic aortic valve/status post TAVR. -Patient seen in consultation by cardiology who discussed with patient and decision made to treat medically with IV heparin  for 48 hours and continue medical therapy. -IV heparin  discontinued due to spontaneous retroperitoneal bleed. -Discussed with Cardiology , although not ideal, ok to hold asa as well at this point given bleeding risk   3.  Acute retroperitoneal bleed -Noted on CT abdomen and pelvis with contrast 12/09/2023. -Patient status post transfusion of 1 unit platelets, 2 units PRBCs, thiamine, DDAVP . -Patient noted to have started on aspirin  which has subsequently been discontinued. -Anticoagulation for thromboembolic prophylaxis given A-fib on hold due to risk of bleeding. -Hemoglobin currently at 9.1 from 7.0 after transfusion of 2 units PRBCs on 12/12/2023.   -Hemoglobin noted to have been 8.0 on 12/11/2023. -Due to presentation with non-STEMI and cardiac history patient was transfused 2 units PRBCs on 12/12/2023.  -Repeat CT abdomen and pelvis (12/12/2023 ), with redemonstrated large right retroperitoneal hematoma with hematocrit level.  Hematoma measures slightly larger compared to prior exam.  Increased bilateral pleural effusions with hazy edema or infiltrates at the lung bases.  Cardiomegaly.   -IR following.  -Hgb had remained stable   4.  Paroxysmal A-fib -Currently rate controlled. -Anticoagulation on hold secondary to retroperitoneal bleed as risk of bleeding outweighs benefits at this time and per cardiology recommendations. -Outpatient follow-up with primary cardiologist for possible evaluation for Watchman versus retrial per cardiology. -Per cardiology.   5.  History of  complete heart block/SSS/status post pacemaker implantation -Stable. -Per cardiology.   6.  Hyperlipidemia -Statin.   7.  CAD status post CABG/status post TAVR -Patient noted to have last dose of Plavix  12/08/2023. -Patient was to be started on aspirin  12/11/2023 per cardiology recommendations however due to drop in hemoglobin aspirin  discontinued. -Per cardiology.   8.  Acute kidney injury -Felt likely hemodynamically mediated from combination of CHF exacerbation, acute infection with evolution to ATN in the setting of ACE inhibitor -Patient being followed by nephrology and underwent CRRT 1/2-1/3. -CRRT paused but restarted 12/09/2023 in the setting of retroperitoneal bleed and stopped on 12/11/2023. -Patient with urine output of 2.790 L over the past 24 hours. -Creatinine currently at 1.86 today from 2.22 yesterday. -Continue to hold ACE inhibitor -Per nephrology.   9.  Hyperkalemia -Likely secondary to problem #8 -Resolved.  Potassium of 4.0. -Lokelma  discontinued. -Ensure changed to nephro per nephrology. -Per nephrology.   10.  Anion gap metabolic acidosis -Likely secondary to acute kidney injury. -Improved with CRRT. -Per nephrology.   11.  Shock -Not largely cardiogenic but cannot rule out a septic component.-Patien -Status post dose of antibiotic coverage for community-acquired pneumonia and status post treatment for COVID infection. -Shock resolved.   12.  Normocytic anemia/acute blood loss anemia -Likely secondary to retroperitoneal bleed and acute illness. -See above, retroperitoneal bleed. -Not ideal, but ok to hold ASA as well as anticoagulation at this time given increased risk of life threatening bleeding   13.  Dysphagia -Patient seen in consultation by GI, patient noted to have some intermittent dysphagia mostly to solids, dense bread and meat occasionally to liquids. -GI and patient deferring any procedures at  this point and GI recommending patient will benefit  from EGD for evaluation of possible esophageal dilatation based on his symptoms in the outpatient setting which will be arranged. -Barium esophagram ordered and pending.   14.  Diabetes mellitus type 2 -Hemoglobin A1c 8.5 (12/06/2023) -Patient noted to be on Farxiga , Tresiba  20 units daily, prior to admission. -Patient presenting acute kidney injury, Farxiga  on hold. -Patient also noted on Decadron . -CBG 247 this morning. -on 20 units semglee  with 3 units meal coverage. Glucose remains poorly controlled -Increase semglee  to 25 units, continue SSI, Neurontin .   15. Agitation, anxiety -More anxious this AM. Pt reports little to no sleep overnight -Pt will need to maintain a good day/night cycle -Chart reviewed. Confirmed with family that pt is normally taking trazadone at night, will resume. Also has at bedtime ativan  on [facility], will resume. Concern that pt may be withdrawing from ativan  as he as not been taking thus far in hospital -Resumed at bedtime ativan . Will provide low dose 0.25mg  dose x1 if needed in the meantime      Subjective: Agitated this AM. Reports poor sleep overnight  Physical Exam: Vitals:   12/16/23 0119 12/16/23 0239 12/16/23 0343 12/16/23 0734  BP:  (!) 134/56 (!) 127/49 117/81  Pulse:  (!) 59 70 60  Resp:   19 16  Temp:   97.8 F (36.6 C) 98.7 F (37.1 C)  TempSrc:   Oral Oral  SpO2:  100% 100% 100%  Weight: 106.8 kg     Height:       General exam: Conversant, in no acute distress Respiratory system: normal chest rise, clear, no audible wheezing Cardiovascular system: regular rhythm, s1-s2 Gastrointestinal system: Nondistended, nontender, pos BS Central nervous system: No seizures, no tremors Extremities: No cyanosis, no joint deformities Skin: No rashes, no pallor Psychiatry: Affect normal // no auditory hallucinations   Data Reviewed:  Labs reviewed: Na 131, K 4.3, Cr 1.28  Family Communication: Pt in room, family at bedside  Disposition: Status  is: Inpatient Remains inpatient appropriate because: severity of illness  Planned Discharge Destination: Rehab    Author: Garnette Pelt, MD 12/16/2023 2:20 PM  For on call review [email or web address].

## 2023-12-17 DIAGNOSIS — E875 Hyperkalemia: Secondary | ICD-10-CM | POA: Diagnosis not present

## 2023-12-17 DIAGNOSIS — I509 Heart failure, unspecified: Secondary | ICD-10-CM | POA: Diagnosis not present

## 2023-12-17 DIAGNOSIS — N179 Acute kidney failure, unspecified: Secondary | ICD-10-CM | POA: Diagnosis not present

## 2023-12-17 LAB — GLUCOSE, CAPILLARY
Glucose-Capillary: 187 mg/dL — ABNORMAL HIGH (ref 70–99)
Glucose-Capillary: 229 mg/dL — ABNORMAL HIGH (ref 70–99)
Glucose-Capillary: 215 mg/dL — ABNORMAL HIGH (ref 70–99)
Glucose-Capillary: 244 mg/dL — ABNORMAL HIGH (ref 70–99)

## 2023-12-17 LAB — MAGNESIUM: Magnesium: 1.3 mg/dL — ABNORMAL LOW (ref 1.7–2.4)

## 2023-12-17 LAB — CBC
HCT: 33.7 % — ABNORMAL LOW (ref 39.0–52.0)
Hemoglobin: 11 g/dL — ABNORMAL LOW (ref 13.0–17.0)
MCH: 28 pg (ref 26.0–34.0)
MCHC: 32.6 g/dL (ref 30.0–36.0)
MCV: 85.8 fL (ref 80.0–100.0)
Platelets: 166 10*3/uL (ref 150–400)
RBC: 3.93 MIL/uL — ABNORMAL LOW (ref 4.22–5.81)
RDW: 16.5 % — ABNORMAL HIGH (ref 11.5–15.5)
WBC: 13.9 10*3/uL — ABNORMAL HIGH (ref 4.0–10.5)
nRBC: 0 % (ref 0.0–0.2)

## 2023-12-17 LAB — COMPREHENSIVE METABOLIC PANEL
ALT: 23 U/L (ref 0–44)
AST: 26 U/L (ref 15–41)
Albumin: 2.4 g/dL — ABNORMAL LOW (ref 3.5–5.0)
Alkaline Phosphatase: 93 U/L (ref 38–126)
Anion gap: 9 (ref 5–15)
BUN: 43 mg/dL — ABNORMAL HIGH (ref 8–23)
CO2: 33 mmol/L — ABNORMAL HIGH (ref 22–32)
Calcium: 8.3 mg/dL — ABNORMAL LOW (ref 8.9–10.3)
Chloride: 90 mmol/L — ABNORMAL LOW (ref 98–111)
Creatinine, Ser: 1.15 mg/dL (ref 0.61–1.24)
GFR, Estimated: >60 mL/min (ref >60)
Glucose, Bld: 252 mg/dL — ABNORMAL HIGH (ref 70–99)
Potassium: 4.1 mmol/L (ref 3.5–5.1)
Sodium: 132 mmol/L — ABNORMAL LOW (ref 135–145)
Total Bilirubin: 1.8 mg/dL — ABNORMAL HIGH (ref 0.0–1.2)
Total Protein: 6 g/dL — ABNORMAL LOW (ref 6.5–8.1)

## 2023-12-17 MED ORDER — POLYETHYLENE GLYCOL 3350 17 G PO PACK
17.0000 g | PACK | Freq: Two times a day (BID) | ORAL | Status: DC
  Administered 2023-12-17 (×2): 17 g via ORAL
  Filled 2023-12-17 (×3): qty 1

## 2023-12-17 MED ORDER — MAGNESIUM SULFATE 4 GM/100ML IV SOLN
4.0000 g | Freq: Once | INTRAVENOUS | Status: AC
  Administered 2023-12-17: 4 g via INTRAVENOUS
  Filled 2023-12-17: qty 100

## 2023-12-17 NOTE — Progress Notes (Signed)
   12/17/23 0006  BiPAP/CPAP/SIPAP  $ Non-Invasive Home Ventilator  Subsequent  BiPAP/CPAP/SIPAP Pt Type Adult  BiPAP/CPAP/SIPAP Resmed  Flow Rate 3 lpm  Patient Home Equipment Yes  Safety Check Completed by RT for Home Unit Yes, no issues noted

## 2023-12-17 NOTE — Progress Notes (Signed)
 Progress Note   Patient: Shane [name] FMW:[id number] DOB: February 24, 1941 DOA: 12/06/2023     11 DOS: the patient was seen and examined on 12/17/2023   Brief hospital course: 83 year old male with significant past medical history including obstructive sleep apnea on CPAP at night, CAD s/p CABG x3 in 2021, aortic stenosis s/p TAVR 08/2023 at [facility], HFpEF, atrial fibrillation on Eliquis , plavix , sick sinus syndrome s/p leadless PPM, hypertension, hyperlipidemia, GERD, uncontrolled diabetes, CKD 3, pulmonary fibrosis followed by Dr. Geronimo, who presented to the emergency department on 12/06/2023 with shortness of breath. Per the ED provider complaining of cough and shortness of breath over the past 3 days. Using his nebulizers at home with no improvement in his symptoms. EMS was called and found to be hypotensive and hypoxic. He was started on nonrebreather and an epinephrine  infusion prior to arrival. In the ED afebrile, SBP around 100, tachypneic. Labs +COVID, AKI on CKD with BUN/Cr 116, 4.69 with critical K of 7.1. bicarb 16. CBC without leukocytosis, hgb 9.5. lactic 2.3>1.9, troponin 8,290, UA negative, blood cultures pending. CXR with likely pulmonary edema. With new AKI, hyperkalemia and EKG changes, nephrology was consulted. Patient given lokelma , albuterol , Lasix , sodium bicarb, 5U insulin  with D50, calcium  gluconate. Was also given azithromycin /rocephin  CAP coverage. Neprhology recommended albumin  for BP support (initially on 2mcg levo, now off), Lasix  80mg  IV, renal ultrasound. Pending repeat BMP on consult for admission to ICU.     Significant events: 1/1: Admit to CCM for covid pneumonia, aki with hyperK likely needing CRRT  Renal consultation -presented with a creatinine of 4.  7 mg percent.  [Baseline July 2024 was 1.8 mg percent] 1/2: CRRT initiated, Card Consult due to elev Troponin (Nstemi vs Demand Ischemia)  Cardiology consultation -ejection fraction 40% compared to baseline 55%. 1/3:  Continuing CRRT till this afternoon, PCCM sign off this afternoon, trx to [facility] tomorrow  -> heparin  being continuedFor total 48 hours.  Cardiology recommended continuing Plavix  but holding aspirin .  Cardiology recommending Toprol  once blood pressure stabilized.      Significant studies: 1/1 admission CXR: bilateral infiltrates c/w CHF 1/1 US  renal: Unremarkable  1/2 Echo: EF 40%, RWMA noted, reduced RVSF, moderate MR, TAVR 1/4 CT abdomen and pelvis: new active right RP bleed, chronic ILD   Significant microbiology data: 1/1 COVID + 1/1 Blood cx x2: NG    Procedures: 1/2 CRRT    Consults: Nephrology General Surgery  IR Gastroenterology   Assessment and Plan: #1 acute on chronic hypoxic respiratory failure likely multifactorial secondary to acute COVID-19 infection, in the setting of pulmonary edema secondary to acute systolic CHF exacerbation in the setting of underlying pulmonary fibrosis -Patient noted to be on baseline O2 2 L nasal cannula at bedtime PTA -Patient noted to have presented with cough, shortness of breath x 3 days with no improvement on home nebulizers. -EMS noted on presentation patient to be hypoxic. -Patient states wife and grandson had been sick with COVID infection recently. -MRSA PCR negative. -SARS coronavirus 2 PCR positive, influenza A and B by PCR negative. -Initial chest x-ray findings concerning for CHF exacerbation. -Patient initially received a dose of IV azithromycin  and Rocephin  x 1. -Patient status post 3-day course of remdesivir  and currently on Decadron . -Patient also received IV Lasix  with diuresis. -Patient also noted to have undergone CRRT during the hospitalization. -weaned o2 to RA -Hycodan as needed. -Patient with clinical improvement. HD tunneled cath removed per Nephro -now on oral bid lasix    2.  Non-STEMI -Patient  on admission noted to have elevated troponins peaking at 8-9 0 and subsequently trending down. -Felt likely  secondary to demand ischemia in the setting of COVID-19 infection with underlying cardiomyopathy CAD/history of CABG. -Patient denies any chest pain. -2D echo obtained with EF of 40%,WMA, moderately dilated left atrial size, mildly dilated right atrial size, status post bioprosthetic aortic valve/status post TAVR. -Patient seen in consultation by cardiology who discussed with patient and decision made to treat medically with IV heparin  for 48 hours and continue medical therapy. -IV heparin  discontinued due to spontaneous retroperitoneal bleed. -Recently discussed with Cardiology , although not ideal, ok to hold asa as well at this point given bleeding risk   3.  Acute retroperitoneal bleed -Noted on CT abdomen and pelvis with contrast 12/09/2023. -Patient status post transfusion of 1 unit platelets, 2 units PRBCs, thiamine, DDAVP . -Patient noted to have started on aspirin  which has subsequently been discontinued. -Anticoagulation for thromboembolic prophylaxis given A-fib on hold due to risk of bleeding. -Hemoglobin currently at 9.1 from 7.0 after transfusion of 2 units PRBCs on 12/12/2023.   -Hemoglobin noted to have been 8.0 on 12/11/2023. -Due to presentation with non-STEMI and cardiac history patient was transfused 2 units PRBCs on 12/12/2023.  -Repeat CT abdomen and pelvis (12/12/2023 ), with redemonstrated large right retroperitoneal hematoma with hematocrit level.  Hematoma measures slightly larger compared to prior exam.  Increased bilateral pleural effusions with hazy edema or infiltrates at the lung bases.  Cardiomegaly.   -IR had been following.  -Hgb had remained stable and is now up to 11.0   4.  Paroxysmal A-fib -Currently rate controlled. -Anticoagulation on hold secondary to retroperitoneal bleed as risk of bleeding outweighs benefits at this time and per cardiology recommendations. -Outpatient follow-up with primary cardiologist for possible evaluation for Watchman versus retrial per  cardiology. -Per cardiology.   5.  History of complete heart block/SSS/status post pacemaker implantation -Stable. -Per cardiology.   6.  Hyperlipidemia -Statin.   7.  CAD status post CABG/status post TAVR -Patient noted to have last dose of Plavix  12/08/2023. -Patient was to be started on aspirin  12/11/2023 per cardiology recommendations however due to drop in hemoglobin aspirin  discontinued. -Per cardiology.   8.  Acute kidney injury -Felt likely hemodynamically mediated from combination of CHF exacerbation, acute infection with evolution to ATN in the setting of ACE inhibitor -Patient being followed by nephrology and underwent CRRT 1/2-1/3. -CRRT paused but restarted 12/09/2023 in the setting of retroperitoneal bleed and stopped on 12/11/2023. -Patient with urine output of 2.790 L over the past 24 hours. -Creatinine currently at 1.86 today from 2.22 yesterday. -Continue to hold ACE inhibitor -Per nephrology.   9.  Hyperkalemia -Likely secondary to problem #8 -Resolved.  Potassium of 4.1 -Lokelma  discontinued. -Ensure changed to nephro per nephrology. -Per nephrology.   10.  Anion gap metabolic acidosis -Likely secondary to acute kidney injury. -Improved with CRRT. -Per nephrology.   11.  Shock -Not largely cardiogenic but cannot rule out a septic component.-Patien -Status post dose of antibiotic coverage for community-acquired pneumonia and status post treatment for COVID infection. -Shock resolved.   12.  Normocytic anemia/acute blood loss anemia -Likely secondary to retroperitoneal bleed and acute illness. -See above, retroperitoneal bleed. -Not ideal, but ok to hold ASA as well as anticoagulation at this time given increased risk of life threatening bleeding   13.  Dysphagia -Patient seen in consultation by GI, patient noted to have some intermittent dysphagia mostly to solids, dense bread and meat  occasionally to liquids. -GI and patient deferring any procedures at this  point and GI recommending patient will benefit from EGD for evaluation of possible esophageal dilatation based on his symptoms in the outpatient setting which will be arranged. -Barium esophagram ordered and pending.   14.  Diabetes mellitus type 2 -Hemoglobin A1c 8.5 (12/06/2023) -Patient noted to be on Farxiga , Tresiba  20 units daily, prior to admission. -Patient presenting acute kidney injury, Farxiga  on hold. -Patient also noted on Decadron . -CBG 247 this morning. -on 20 units semglee  with 3 units meal coverage. Glucose remains poorly controlled -Increase semglee  to 25 units, continue SSI, Neurontin .   15. Agitation, anxiety -Recent insomnia reported over the weekend  -Pt will need to maintain a good day/night cycle -Resumed at bedtime ativan  and trazadone.  -Pt slept much better and feels much better today      Subjective: Slept much better overnight. Feels much better this AM  Physical Exam: Vitals:   12/16/23 1958 12/17/23 0337 12/17/23 0500 12/17/23 0746  BP: (!) 139/52 124/68  137/71  Pulse: 68 64  62  Resp: 18 18  16   Temp: 98.3 F (36.8 C) 98 F (36.7 C)    TempSrc:      SpO2: 92% 97%  100%  Weight:   103.8 kg   Height:       General exam: Awake, laying in bed, in nad Respiratory system: Normal respiratory effort, no wheezing Cardiovascular system: regular rate, s1, s2 Gastrointestinal system: Soft, nondistended, positive BS Central nervous system: CN2-12 grossly intact, strength intact Extremities: Perfused, no clubbing Skin: Normal skin turgor, no notable skin lesions seen Psychiatry: Mood normal // no visual hallucinations   Data Reviewed:  Labs reviewed: Na 132, k 4.1, Cr 1.15, WBC 13.9, Hgb 11.0  Family Communication: Pt in room, family at bedside  Disposition: Status is: Inpatient Remains inpatient appropriate because: severity of illness  Planned Discharge Destination: Rehab    Author: Garnette Pelt, MD 12/17/2023 2:57 PM  For on call review  [email or web address].

## 2023-12-18 DIAGNOSIS — I509 Heart failure, unspecified: Secondary | ICD-10-CM | POA: Diagnosis not present

## 2023-12-18 DIAGNOSIS — E875 Hyperkalemia: Secondary | ICD-10-CM | POA: Diagnosis not present

## 2023-12-18 DIAGNOSIS — N179 Acute kidney failure, unspecified: Secondary | ICD-10-CM | POA: Diagnosis not present

## 2023-12-18 LAB — COMPREHENSIVE METABOLIC PANEL
ALT: 21 U/L (ref 0–44)
AST: 25 U/L (ref 15–41)
Albumin: 2.5 g/dL — ABNORMAL LOW (ref 3.5–5.0)
Alkaline Phosphatase: 97 U/L (ref 38–126)
Anion gap: 11 (ref 5–15)
BUN: 36 mg/dL — ABNORMAL HIGH (ref 8–23)
CO2: 32 mmol/L (ref 22–32)
Calcium: 8.3 mg/dL — ABNORMAL LOW (ref 8.9–10.3)
Chloride: 89 mmol/L — ABNORMAL LOW (ref 98–111)
Creatinine, Ser: 1.06 mg/dL (ref 0.61–1.24)
GFR, Estimated: >60 mL/min (ref >60)
Glucose, Bld: 160 mg/dL — ABNORMAL HIGH (ref 70–99)
Potassium: 4.1 mmol/L (ref 3.5–5.1)
Sodium: 132 mmol/L — ABNORMAL LOW (ref 135–145)
Total Bilirubin: 1.7 mg/dL — ABNORMAL HIGH (ref 0.0–1.2)
Total Protein: 6 g/dL — ABNORMAL LOW (ref 6.5–8.1)

## 2023-12-18 LAB — CBC
HCT: 35.7 % — ABNORMAL LOW (ref 39.0–52.0)
Hemoglobin: 11.8 g/dL — ABNORMAL LOW (ref 13.0–17.0)
MCH: 28.3 pg (ref 26.0–34.0)
MCHC: 33.1 g/dL (ref 30.0–36.0)
MCV: 85.6 fL (ref 80.0–100.0)
Platelets: 169 10*3/uL (ref 150–400)
RBC: 4.17 MIL/uL — ABNORMAL LOW (ref 4.22–5.81)
RDW: 16.7 % — ABNORMAL HIGH (ref 11.5–15.5)
WBC: 13.9 10*3/uL — ABNORMAL HIGH (ref 4.0–10.5)
nRBC: 0 % (ref 0.0–0.2)

## 2023-12-18 LAB — GLUCOSE, CAPILLARY
Glucose-Capillary: 159 mg/dL — ABNORMAL HIGH (ref 70–99)
Glucose-Capillary: 218 mg/dL — ABNORMAL HIGH (ref 70–99)
Glucose-Capillary: 179 mg/dL — ABNORMAL HIGH (ref 70–99)
Glucose-Capillary: 200 mg/dL — ABNORMAL HIGH (ref 70–99)
Glucose-Capillary: 234 mg/dL — ABNORMAL HIGH (ref 70–99)

## 2023-12-18 LAB — MAGNESIUM: Magnesium: 1.7 mg/dL (ref 1.7–2.4)

## 2023-12-18 MED ORDER — POLYETHYLENE GLYCOL 3350 17 G PO PACK
17.0000 g | PACK | Freq: Three times a day (TID) | ORAL | Status: DC
  Administered 2023-12-18 – 2023-12-26 (×12): 17 g via ORAL
  Filled 2023-12-18 (×15): qty 1

## 2023-12-18 MED ORDER — INSULIN GLARGINE-YFGN 100 UNIT/ML ~~LOC~~ SOLN
25.0000 [IU] | Freq: Every day | SUBCUTANEOUS | Status: DC
  Administered 2023-12-19: 25 [IU] via SUBCUTANEOUS
  Filled 2023-12-18: qty 0.25

## 2023-12-18 NOTE — Progress Notes (Signed)
 Inpatient Rehab Admissions Coordinator:   Awaiting insurance determination from [facility].   Estill Dooms, PT, DPT Admissions Coordinator [phone] 12/18/23  11:38 AM

## 2023-12-18 NOTE — Progress Notes (Signed)
   12/18/23 0045  BiPAP/CPAP/SIPAP  $ Non-Invasive Home Ventilator  Subsequent  BiPAP/CPAP/SIPAP Pt Type Adult  BiPAP/CPAP/SIPAP Resmed  Flow Rate 3 lpm  Patient Home Equipment Yes  Safety Check Completed by RT for Home Unit Yes, no issues noted

## 2023-12-18 NOTE — TOC Progression Note (Addendum)
 Transition of Care [facility]) - Progression Note    Patient Details  Name: Shane [name] MRN: [id number] Date of Birth: 1941-06-24  Transition of Care [facility]) CM/SW Contact  Taleeya Blondin A Reginia Battie, LCSWA Phone Number: 12/18/2023, 1:35 PM  Clinical Narrative:      Update 1645 CSW reached phone call from pt's wife, Avelina asking about pt's care. CSW provided information for nursing floor unit supervisor Lynnann Edin and also informed pt's bedside nurse and charge nurses of information to follow up with pt's wife. She also informed CSW that pt has preference for [facility] for SNF.   CSW spoke with pt and he stated he was agreeable to SNF in event CIR was not possible due to lack of insurance coverage.   CSW completed SNF workup, bed offers pending for pt.    TOC will continue to follow.        Expected Discharge Plan and Services                                               Social Determinants of Health (SDOH) Interventions SDOH Screenings   Food Insecurity: No Food Insecurity (12/07/2023)  Housing: Low Risk  (12/07/2023)  Transportation Needs: No Transportation Needs (12/07/2023)  Utilities: Not At Risk (12/07/2023)  Depression (PHQ2-9): Low Risk  (01/11/2021)  Financial Resource Strain: Low Risk  (08/22/2023)   Received from [facility]  Social Connections: Moderately Isolated (12/07/2023)  Tobacco Use: Low Risk  (12/07/2023)    Readmission Risk Interventions    12/15/2023    2:29 PM  Readmission Risk Prevention Plan  Transportation Screening Complete  Medication Review (RN Care Manager) Complete  PCP or Specialist appointment within 3-5 days of discharge Complete  HRI or Home Care Consult Complete  SW Recovery Care/Counseling Consult Complete  Palliative Care Screening Complete  Skilled Nursing Facility Not Applicable

## 2023-12-18 NOTE — Progress Notes (Signed)
 Interventional Radiology Brief Note:  Patient with prior order for esophagram.  Per GI consultation completed 1/6, recommended further evaluation as an outpatient.  Have canceled order for inpatient esophagram at this time.   Loyce Dys, MS RD PA-C

## 2023-12-18 NOTE — Progress Notes (Signed)
 Patient requested for a enema. Enema was completed at 1500. Had a medium size stool. Whole bed was changed along with a new gown. Purewick was replaced during the time. Complains of feeling raw on his buttocks and in the folds of perineum. Cream was placed in appropriate areas. Water was brought to patient. His needs have been meet.

## 2023-12-18 NOTE — Progress Notes (Addendum)
 Physical Therapy Treatment Patient Details Name: Shane [name] MRN: [id number] DOB: 28-Apr-1941 Today's Date: 12/18/2023   History of Present Illness 83 year old male presented to ED 1/1 with shortness of breath.  Dx: Covid 19 PNA, Acute kidney injury, Acute hypoxic respiratory failure, Normocytic Anemia - secondary to RP bleed and critical illness, Acute HF with reduced EF, s/p CRRT. 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 Able to donne and practice lateral scoot transfers along better with better hip clearance. Sit to stand training with Stedy, still requires bed to be significantly elevated to rise, but performed with mod assist +1 from bed with better extension of hips and knees, and did not require bil knee block from device. Endurance limited, first stand tolerated approx 1 minute, second round 20 seconds. He feels very SOB, but portrays only mild dyspnea with SpO2 100% on 1L supplemental O2. Lt knee painful with weight bearing, not an issue prior to admission. Emphasis on weight shifting, upright posture and posterior chain engagement. Reviewed bed level exercises and IS use. Continue to wear RLE shrinker sock. Great session. Agreeable to OOB > chair however another soap suds enema has been ordered and nursing will likely need to have pt in bed for application. Therefore left in bed in chair mode and encouraged to roll every 30 minutes for pressure relief. Encouraged OOB with staff using Stedy as tolerated. Encouraged proximal exercises. Rt hip girdle weaker than Lt. He also feels UEs are very weak. Patient will continue to benefit from skilled physical therapy  services to further improve independence with functional mobility. Patient will benefit from intensive inpatient follow up therapy, >3 hours/day.     If plan is discharge home, recommend the following: A lot of help with walking and/or transfers;A lot of help with bathing/dressing/bathroom;Assistance with cooking/housework;Assist for transportation;Help with stairs or ramp for entrance   Can travel by private vehicle        Equipment Recommendations  Other (comment) (TBD next venue)    Recommendations for Other Services Rehab consult     Precautions / Restrictions Precautions Precautions: Fall Precaution Comments: Lt disolateral foot wound. Prior Rt BKA. Wears 2 L O2 at night Required Braces or Orthoses: Other Brace Other Brace: RLE prosthesis. Needs to continue to wear shrinker Restrictions Weight Bearing Restrictions Per Provider Order: No     Mobility  Bed Mobility Overal bed mobility: Needs Assistance Bed Mobility: Supine to Sit, Sit to Supine     Supine to sit: Supervision, HOB elevated, Used rails Sit to supine: Supervision   General bed mobility comments: Supervision for safety, better control and recall today. A little effortful to return due to RLE weakness but no physical assist needed.    Transfers Overall transfer level: Needs assistance Equipment used: Ambulation equipment used Transfers: Sit to/from Stand Sit to Stand: Mod assist, From elevated surface           General transfer comment: Mod assist for boost to stand from elevated bed, and min assist for boost to stand from perched position on Stedy paddles. RLE prosthesis was donned. Cues to quickly extend knees and hips with arms adducted for increased triceps use. Slow and effortful but improved from prior visits.  Pt tolerated approx 1 min first attempt and 20 seconds second attempt in standing. LEs still fatigue rapidly and pain in Lt knee with WB. Additonally with RLE prosthesis on today he was able  to perform lateral scoot along bed  with fair hip clearance, bil UE use, light assist from therapist. Transfer via Lift Equipment: Stedy  Ambulation/Gait               General Gait Details: Deferred, unable yet   Stairs             Wheelchair Mobility     Tilt Bed    Modified Rankin (Stroke Patients Only)       Balance Overall balance assessment: Needs assistance Sitting-balance support: No upper extremity supported, Feet supported Sitting balance-Leahy Scale: Fair     Standing balance support: Bilateral upper extremity supported, Reliant on assistive device for balance Standing balance-Leahy Scale: Poor                              Cognition Arousal: Alert Behavior During Therapy: WFL for tasks assessed/performed Overall Cognitive Status: Within Functional Limits for tasks assessed                                          Exercises General Exercises - Lower Extremity Quad Sets: Strengthening, Both, 10 reps, Supine Hip ABduction/ADduction: Strengthening, Both, 10 reps, Supine Hip Flexion/Marching: Strengthening, Both, 10 reps, Supine Other Exercises Other Exercises: Standing on Stedy with BIL UE support, first trial approx 60 seconds with weight shifting lt and Rt, knees unsupported by device. Cues for Rt knee extension to unload LLE a bit. Second trial approx 20 seconds. LEs fatigue quickly. Pt feels SOB.    General Comments General comments (skin integrity, edema, etc.): HR 106, SpO2 100% on 1L supplemental O2, pt feels very SOB, but shows only mild dyspnea.      Pertinent Vitals/Pain Pain Assessment Pain Assessment: Faces Faces Pain Scale: Hurts little more Pain Location: Lt knee Pain Descriptors / Indicators: Sore, Grimacing Pain Intervention(s): Monitored during session, Repositioned    Home Living                          Prior Function            PT Goals (current goals can now be found in the  care plan section) Acute Rehab PT Goals Patient Stated Goal: Get well, go home PT Goal Formulation: With patient/family Time For Goal Achievement: 12/26/23 Potential to Achieve Goals: Good Progress towards PT goals: Progressing toward goals    Frequency    Min 1X/week      PT Plan      Co-evaluation              AM-PAC PT 6 Clicks Mobility   Outcome Measure  Help needed turning from your back to your side while in a flat bed without using bedrails?: A Little Help needed moving from lying on your back to sitting on the side of a flat bed without using bedrails?: A Little Help needed moving to and from a bed to a chair (including a wheelchair)?: A Lot Help needed standing up from a chair using your arms (e.g., wheelchair or bedside chair)?: A Lot Help needed to walk in hospital room?: Total Help needed  climbing 3-5 steps with a railing? : Total 6 Click Score: 12    End of Session Equipment Utilized During Treatment: Gait belt Activity Tolerance: Patient tolerated treatment well (Easily fatigued) Patient left: in bed;with call bell/phone within reach;with family/visitor present;with bed alarm set (Rt knee extended with support, bed in chair position.) Nurse Communication: Mobility status;Need for lift equipment PT Visit Diagnosis: Muscle weakness (generalized) (M62.81);Difficulty in walking, not elsewhere classified (R26.2);Pain Pain - Right/Left: Right Pain - part of body:  (flank and abdomen)     Time: 8896-8870 PT Time Calculation (min) (ACUTE ONLY): 26 min  Charges:    $Therapeutic Exercise: 8-22 mins $Therapeutic Activity: 8-22 mins PT General Charges $$ ACUTE PT VISIT: 1 Visit                     Leontine Roads, PT, DPT [facility]  Rehabilitation Services Physical Therapist Office: [phone] Website: [email or web address]    Leontine GORMAN Roads 12/18/2023, 12:00 PM

## 2023-12-18 NOTE — Progress Notes (Addendum)
 Progress Note   Patient: Shane [name] FMW:[id number] DOB: 1941-05-24 DOA: 12/06/2023     12 DOS: the patient was seen and examined on 12/18/2023   Brief hospital course: 83 year old male with significant past medical history including obstructive sleep apnea on CPAP at night, CAD s/p CABG x3 in 2021, aortic stenosis s/p TAVR 08/2023 at [facility], HFpEF, atrial fibrillation on Eliquis , plavix , sick sinus syndrome s/p leadless PPM, hypertension, hyperlipidemia, GERD, uncontrolled diabetes, CKD 3, pulmonary fibrosis followed by Dr. Geronimo, who presented to the emergency department on 12/06/2023 with shortness of breath. Per the ED provider complaining of cough and shortness of breath over the past 3 days. Using his nebulizers at home with no improvement in his symptoms. EMS was called and found to be hypotensive and hypoxic. He was started on nonrebreather and an epinephrine  infusion prior to arrival. In the ED afebrile, SBP around 100, tachypneic. Labs +COVID, AKI on CKD with BUN/Cr 116, 4.69 with critical K of 7.1. bicarb 16. CBC without leukocytosis, hgb 9.5. lactic 2.3>1.9, troponin 8,290, UA negative, blood cultures pending. CXR with likely pulmonary edema. With new AKI, hyperkalemia and EKG changes, nephrology was consulted. Patient given lokelma , albuterol , Lasix , sodium bicarb, 5U insulin  with D50, calcium  gluconate. Was also given azithromycin /rocephin  CAP coverage. Neprhology recommended albumin  for BP support (initially on 2mcg levo, now off), Lasix  80mg  IV, renal ultrasound. Pending repeat BMP on consult for admission to ICU.     Significant events: 1/1: Admit to CCM for covid pneumonia, aki with hyperK likely needing CRRT  Renal consultation -presented with a creatinine of 4.  7 mg percent.  [Baseline July 2024 was 1.8 mg percent] 1/2: CRRT initiated, Card Consult due to elev Troponin (Nstemi vs Demand Ischemia)  Cardiology consultation -ejection fraction 40% compared to baseline 55%. 1/3:  Continuing CRRT till this afternoon, PCCM sign off this afternoon, trx to [facility] tomorrow  -> heparin  being continuedFor total 48 hours.  Cardiology recommended continuing Plavix  but holding aspirin .  Cardiology recommending Toprol  once blood pressure stabilized.      Significant studies: 1/1 admission CXR: bilateral infiltrates c/w CHF 1/1 US  renal: Unremarkable  1/2 Echo: EF 40%, RWMA noted, reduced RVSF, moderate MR, TAVR 1/4 CT abdomen and pelvis: new active right RP bleed, chronic ILD   Significant microbiology data: 1/1 COVID + 1/1 Blood cx x2: NG    Procedures: 1/2 CRRT    Consults: Nephrology General Surgery  IR Gastroenterology   Assessment and Plan: #1 acute on chronic hypoxic respiratory failure likely multifactorial secondary to acute COVID-19 infection, in the setting of pulmonary edema secondary to acute systolic CHF exacerbation in the setting of underlying pulmonary fibrosis -Patient noted to be on baseline O2 2 L nasal cannula at bedtime PTA -Patient noted to have presented with cough, shortness of breath x 3 days with no improvement on home nebulizers. -EMS noted on presentation patient to be hypoxic. -Patient states wife and grandson had been sick with COVID infection recently. -MRSA PCR negative. -SARS coronavirus 2 PCR positive, influenza A and B by PCR negative. -Initial chest x-ray findings concerning for CHF exacerbation. -Patient initially received a dose of IV azithromycin  and Rocephin  x 1. -Patient status post 3-day course of remdesivir  and currently on Decadron . -Patient also received IV Lasix  with diuresis. -Patient also noted to have undergone CRRT during the hospitalization. -weaned o2 to RA -Hycodan as needed. -Patient with clinical improvement. HD tunneled cath removed per Nephro -now on oral bid lasix  -Pt has completed over 10 days  of isolation.  -Awaiting insurance auth for CIR   2.  Non-STEMI -Patient on admission noted to have elevated  troponins peaking at 8-9 0 and subsequently trending down. -Felt likely secondary to demand ischemia in the setting of COVID-19 infection with underlying cardiomyopathy CAD/history of CABG. -Patient denies any chest pain. -2D echo obtained with EF of 40%,WMA, moderately dilated left atrial size, mildly dilated right atrial size, status post bioprosthetic aortic valve/status post TAVR. -Patient seen in consultation by cardiology who discussed with patient and decision made to treat medically with IV heparin  for 48 hours and continue medical therapy. -IV heparin  discontinued due to spontaneous retroperitoneal bleed. -Recently discussed with Cardiology , although not ideal, ok to hold asa as well at this point given bleeding risk   3.  Acute retroperitoneal bleed -Noted on CT abdomen and pelvis with contrast 12/09/2023. -Patient status post transfusion of 1 unit platelets, 2 units PRBCs, thiamine, DDAVP . -Patient noted to have started on aspirin  which has subsequently been discontinued. -Anticoagulation for thromboembolic prophylaxis given A-fib on hold due to risk of bleeding. -Hemoglobin currently at 9.1 from 7.0 after transfusion of 2 units PRBCs on 12/12/2023.   -Hemoglobin noted to have been 8.0 on 12/11/2023. -Due to presentation with non-STEMI and cardiac history patient was transfused 2 units PRBCs on 12/12/2023.  -Repeat CT abdomen and pelvis (12/12/2023 ), with redemonstrated large right retroperitoneal hematoma with hematocrit level.  Hematoma measures slightly larger compared to prior exam.  Increased bilateral pleural effusions with hazy edema or infiltrates at the lung bases.  Cardiomegaly.   -IR had been following.  -Hgb had remained stable and is now up to 11.8   4.  Paroxysmal A-fib -Currently rate controlled. -Anticoagulation on hold secondary to retroperitoneal bleed as risk of bleeding outweighs benefits at this time and per cardiology recommendations. -Outpatient follow-up with primary  cardiologist for possible evaluation for Watchman versus retrial per cardiology. -Per cardiology.   5.  History of complete heart block/SSS/status post pacemaker implantation -Stable. -Per cardiology.   6.  Hyperlipidemia -Statin.   7.  CAD status post CABG/status post TAVR -Patient noted to have last dose of Plavix  12/08/2023. -Patient was to be started on aspirin  12/11/2023 per cardiology recommendations however due to drop in hemoglobin aspirin  discontinued. -Per cardiology.   8.  Acute kidney injury -Felt likely hemodynamically mediated from combination of CHF exacerbation, acute infection with evolution to ATN in the setting of ACE inhibitor -Patient being followed by nephrology and underwent CRRT 1/2-1/3. -CRRT paused but restarted 12/09/2023 in the setting of retroperitoneal bleed and stopped on 12/11/2023. -Patient with urine output of 2.790 L over the past 24 hours. -Renal function now normalized. -Continue to hold ACE inhibitor -Per nephrology.   9.  Hyperkalemia -Likely secondary to problem #8 -Resolved.  Potassium of 4.1 -Lokelma  discontinued. -Ensure changed to nephro per nephrology. -Per nephrology.   10.  Anion gap metabolic acidosis -Likely secondary to acute kidney injury. -Improved with CRRT. -Per nephrology.   11.  Shock -Not largely cardiogenic but cannot rule out a septic component.-Patien -Status post dose of antibiotic coverage for community-acquired pneumonia and status post treatment for COVID infection. -Shock resolved.   12.  Normocytic anemia/acute blood loss anemia -Likely secondary to retroperitoneal bleed and acute illness. -See above, retroperitoneal bleed. -Not ideal, but ok to hold ASA as well as anticoagulation at this time given increased risk of life threatening bleeding   13.  Dysphagia -Patient seen in consultation by GI, patient noted to have  some intermittent dysphagia mostly to solids, dense bread and meat occasionally to liquids. -GI  and patient deferring any procedures at this point and GI recommending patient will benefit from EGD for evaluation of possible esophageal dilatation based on his symptoms in the outpatient setting which will be arranged.   14.  Diabetes mellitus type 2 -Hemoglobin A1c 8.5 (12/06/2023) -Patient noted to be on Farxiga , Tresiba  20 units daily, prior to admission. -Patient presenting acute kidney injury, Farxiga  on hold. -Patient also noted on Decadron . -CBG 247 this morning. -on 20 units semglee  with 3 units meal coverage. Glucose remains poorly controlled -Increase semglee  to 25 units, continue SSI, Neurontin .   15. Agitation, anxiety -Recent insomnia reported over the weekend  -Pt will need to maintain a good day/night cycle -Resumed at bedtime ativan  and trazadone.  -Pt slept much better and feels much better today      Subjective: Complaining of feeling constipated, requesting another enema  Physical Exam: Vitals:   12/17/23 1948 12/18/23 0355 12/18/23 0419 12/18/23 1206  BP: 127/82 (!) 149/64  (!) 110/55  Pulse: 90 65  69  Resp: 18 18    Temp: 98.4 F (36.9 C) (!) 97.4 F (36.3 C)  98.1 F (36.7 C)  TempSrc:      SpO2: 94% 98%  100%  Weight:   105.4 kg   Height:       General exam: Conversant, in no acute distress Respiratory system: normal chest rise, clear, no audible wheezing Cardiovascular system: regular rhythm, s1-s2 Gastrointestinal system: Nondistended, nontender Central nervous system: No seizures, no tremors Extremities: No cyanosis, no joint deformities Skin: No rashes, no pallor Psychiatry: Affect normal // no auditory hallucinations   Data Reviewed:  Labs reviewed: Na 132, k 4.1, Cr 1.06, WBC 13.9, hgb 11.8, Plts 169  Family Communication: Pt in room, family at bedside  Disposition: Status is: Inpatient Remains inpatient appropriate because: severity of illness  Planned Discharge Destination: Rehab    Author: Garnette Pelt, MD 12/18/2023 12:42  PM  For on call review [email or web address].

## 2023-12-18 NOTE — [place] FL2 (Signed)
 [place]  MEDICAID FL2 LEVEL OF CARE FORM     IDENTIFICATION  Patient Name: Shane [name] Birthdate: 1941-06-09 Sex: male Admission Date (Current Location): 12/06/2023  [facility] and [place] Number:  Producer, Television/film/video and Address:  [place]. [facility], [address] [place], [place] [address]      Provider Number: [id number]  Attending Physician Name and Address:  Cindy Garnette POUR, MD  Relative Name and Phone Number:  Ruttan,Patricia (Spouse)  [phone]    Current Level of Care: Hospital Recommended Level of Care: Skilled Nursing Facility Prior Approval Number:    Date Approved/Denied:   PASRR Number: [id number]  Discharge Plan: SNF    Current Diagnoses: Patient Active Problem List   Diagnosis Date Noted   Type 2 diabetes mellitus without complication, with long-term current use of insulin  (HCC) 12/12/2023   Esophageal dysphagia 12/11/2023   COVID-19 12/11/2023   Acute on chronic hypoxic respiratory failure (HCC) 12/10/2023   Anemia 12/10/2023   Non-ST elevation (NSTEMI) myocardial infarction [facility]) 12/09/2023   Retroperitoneal bleeding 12/09/2023   History of transcatheter aortic valve replacement (TAVR) 12/09/2023   Chronic heart failure with preserved ejection fraction (HFpEF) (HCC) 12/09/2023   Hx of CABG 12/09/2023   Atherosclerosis of native coronary artery of native heart without angina pectoris 12/09/2023   Hyperkalemia 12/07/2023   Acute on chronic congestive heart failure (HCC) 12/07/2023   Acute kidney injury (nontraumatic) (HCC) 12/06/2023   Acute on chronic combined systolic and diastolic CHF, NYHA class 1 (HCC) 06/22/2023   Acute on chronic systolic (congestive) heart failure (HCC) 06/21/2023   Intermediate stage nonexudative age-related macular degeneration of both eyes 04/25/2022   Research study patient 12/24/2021   Labile blood glucose    Pulmonary fibrosis (HCC)    Hyponatremia    S/P BKA (below knee amputation)  unilateral, right (HCC)    Shortness of breath    Atrial fibrillation (HCC)    Postoperative pain    Atherosclerosis of native arteries of extremities with gangrene, right leg (HCC)    Hardware complicating wound infection (HCC)    Abscess of bursa of right ankle 02/15/2021   Endocarditis 01/19/2021   PAD (peripheral artery disease) (HCC) 12/29/2020   Chronic systolic CHF (congestive heart failure) (HCC) 12/22/2020   History of COVID-19 12/22/2020   SSS (sick sinus syndrome) (HCC) 12/22/2020   Acute bacterial endocarditis    MSSA bacteremia 12/09/2020   Diabetic foot ulcer (HCC) 12/09/2020   Foot drop, right foot    Generalized weakness 12/08/2020   Atrial fibrillation, chronic (HCC) 12/08/2020   Elevated troponin level not due myocardial infarction 12/08/2020   Adrenal insufficiency (HCC) 11/14/2020   Orthostatic hypotension 11/14/2020   Physical deconditioning 11/14/2020   Difficulty sleeping 11/07/2020   Right ventricular dysfunction 11/05/2020   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/04/2020   Hearing loss 11/03/2020   Cardiac device in situ 09/09/2020   COVID-19 virus infection 07/18/2020   Coronary artery disease involving native heart without angina pectoris 07/10/2020   Chronic kidney disease, stage 3a (HCC) 03/29/2020   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/28/2020   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) 03/16/2020   Right epiretinal membrane 03/16/2020   Posterior vitreous detachment of right eye 03/16/2020   Aortic stenosis, moderate 03/12/2020   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   Near syncope 04/27/2018   Hypoglycemia due to insulin  01/06/2018   Essential hypertension 01/06/2018   Syncope and collapse 01/05/2018  Encounter for therapeutic drug monitoring 06/29/2017   OSA on CPAP 05/05/2017   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HCC) 01/05/2017   Abnormal chest x-ray 10/12/2016   Benign neoplasm of colon 01/14/2013     Orientation RESPIRATION BLADDER Height & Weight     Self, Time, Place, Situation  O2 (2L) Incontinent, External catheter Weight: 232 lb 5.8 oz (105.4 kg) Height:  6' (182.9 cm)  BEHAVIORAL SYMPTOMS/MOOD NEUROLOGICAL BOWEL NUTRITION STATUS      Incontinent Diet (see DC summary)  AMBULATORY STATUS COMMUNICATION OF NEEDS Skin   Extensive Assist Verbally Other (Comment) (Wound / Incision (Open or Dehisced) 12/07/23 Non-pressure wound Foot Anterior;Left)                       Personal Care Assistance Level of Assistance  Bathing, Feeding, Dressing Bathing Assistance: Maximum assistance Feeding assistance: Limited assistance Dressing Assistance: Maximum assistance     Functional Limitations Info  Sight, Hearing, Speech Sight Info: Adequate Hearing Info: Adequate Speech Info: Adequate    SPECIAL CARE FACTORS FREQUENCY  OT (By licensed OT), PT (By licensed PT)     PT Frequency: 5x/week OT Frequency: 5x/week            Contractures Contractures Info: Not present    Additional Factors Info  Code Status, Allergies, Insulin  Sliding Scale Code Status Info: FULL Allergies Info: Codeine  Ofev  (Nintedanib )  Pirfenidone    Insulin  Sliding Scale Info: insulin  aspart (novoLOG ) injection 3 Units, with meals,  insulin  aspart (novoLOG ) injection 0-5 Units,daily at bedtime       Current Medications (12/18/2023):  This is the current hospital active medication list Current Facility-Administered Medications  Medication Dose Route Frequency Provider Last Rate Last Admin   acetaminophen  (TYLENOL ) tablet 650 mg  650 mg Oral Q4H PRN Autry, [name] E, PA-C   650 mg at 12/12/23 2213   atorvastatin  (LIPITOR ) tablet 80 mg  80 mg Oral Daily Albustami, Omar M, MD   80 mg at 12/18/23 1008   Chlorhexidine  Gluconate Cloth 2 % PADS 6 each  6 each Topical Daily Albustami, Omar M, MD   6 each at 12/18/23 1011   cyanocobalamin  (VITAMIN B12) tablet 1,000 mcg  1,000 mcg Oral Daily Sebastian Toribio GAILS,  MD   1,000 mcg at 12/18/23 1008   docusate sodium  (COLACE) capsule 100 mg  100 mg Oral BID Albustami, Omar M, MD   100 mg at 12/18/23 1009   escitalopram  (LEXAPRO ) tablet 10 mg  10 mg Oral Daily Autry, [name] E, PA-C   10 mg at 12/18/23 1009   feeding supplement (NEPRO CARB STEADY) liquid 237 mL  237 mL Oral BID BM Foster, Lori C, MD   237 mL at 12/18/23 1017   furosemide  (LASIX ) tablet 40 mg  40 mg Oral BID Foster, Lori C, MD   40 mg at 12/18/23 9077   gabapentin  (NEURONTIN ) capsule 300 mg  300 mg Oral Daily Claudene Toribio BROCKS, MD   300 mg at 12/18/23 1008   heparin  sodium (porcine) injection 1,000 Units  1,000 Units Intracatheter PRN Autry, [name] E, PA-C       HYDROcodone  bit-homatropine (HYCODAN) 5-1.5 MG/5ML syrup 5 mL  5 mL Oral Q6H PRN Sebastian Toribio GAILS, MD   5 mL at 12/16/23 2248   HYDROmorphone  (DILAUDID ) injection 1 mg  1 mg Intravenous Q4H PRN Smith, Daniel C, MD   1 mg at 12/14/23 2222   insulin  aspart (novoLOG ) injection 0-20 Units  0-20 Units Subcutaneous TID WC  Smith, Joshua C, NP   7 Units at 12/18/23 1245   insulin  aspart (novoLOG ) injection 0-5 Units  0-5 Units Subcutaneous QHS Smith, Joshua C, NP   2 Units at 12/17/23 2115   insulin  aspart (novoLOG ) injection 3 Units  3 Units Subcutaneous TID WC Sebastian Toribio GAILS, MD   3 Units at 12/18/23 1245   [START ON 12/19/2023] insulin  glargine-yfgn (SEMGLEE ) injection 25 Units  25 Units Subcutaneous Daily Cindy Garnette POUR, MD       ipratropium-albuterol  (DUONEB) 0.5-2.5 (3) MG/3ML nebulizer solution 3 mL  3 mL Nebulization Q4H PRN Mannam, Praveen, MD   3 mL at 12/16/23 0855   LORazepam  (ATIVAN ) injection 0.25 mg  0.25 mg Intravenous PRN Cindy Garnette POUR, MD       LORazepam  (ATIVAN ) tablet 1 mg  1 mg Oral QHS PRN Chiu, Stephen K, MD   1 mg at 12/16/23 2208   melatonin tablet 5 mg  5 mg Oral QHS PRN Haze Led, MD   5 mg at 12/15/23 2149   multivitamin (RENA-VIT) tablet 1 tablet  1 tablet Oral QHS Mannam, Praveen, MD   1 tablet at 12/17/23  2113   ondansetron  (ZOFRAN ) injection 4 mg  4 mg Intravenous Q6H PRN Autry, [name] E, PA-C   4 mg at 12/09/23 9043   Oral care mouth rinse  15 mL Mouth Rinse PRN Albustami, Mancel HERO, MD       oxyCODONE  (Oxy IR/ROXICODONE ) immediate release tablet 5 mg  5 mg Oral Q4H PRN Claudene Toribio BROCKS, MD       pantoprazole  (PROTONIX ) EC tablet 40 mg  40 mg Oral Daily Autry, [name] E, PA-C   40 mg at 12/18/23 1008   phenol (CHLORASEPTIC) mouth spray 1 spray  1 spray Mouth/Throat PRN Smith, Joshua C, NP       polyethylene glycol (MIRALAX  / GLYCOLAX ) packet 17 g  17 g Oral TID BM Cindy Garnette POUR, MD       traZODone  (DESYREL ) tablet 75 mg  75 mg Oral QHS Cindy Garnette POUR, MD   75 mg at 12/17/23 2113   zinc  sulfate (50mg  elemental zinc ) capsule 220 mg  220 mg Oral Daily Cindy Garnette POUR, MD   220 mg at 12/18/23 1008     Discharge Medications: Please see discharge summary for a list of discharge medications.  Relevant Imaging Results:  Relevant Lab Results:   Additional Information SSN:  [id number] Covid + 12/06/23, completed 10 day quarantine  Jasper Hanf A Sadia Belfiore, LCSWA

## 2023-12-18 NOTE — Plan of Care (Signed)
  Problem: Education: Goal: Knowledge of risk factors and measures for prevention of condition will improve Outcome: Progressing   Problem: Coping: Goal: Psychosocial and spiritual needs will be supported Outcome: Progressing   Problem: Respiratory: Goal: Will maintain a patent airway Outcome: Progressing Goal: Complications related to the disease process, condition or treatment will be avoided or minimized Outcome: Progressing   Problem: Education: Goal: Ability to describe self-care measures that may prevent or decrease complications (Diabetes Survival Skills Education) will improve Outcome: Progressing Goal: Individualized Educational Video(s) Outcome: Progressing   Problem: Coping: Goal: Ability to adjust to condition or change in health will improve Outcome: Progressing   Problem: Fluid Volume: Goal: Ability to maintain a balanced intake and output will improve Outcome: Progressing   Problem: Health Behavior/Discharge Planning: Goal: Ability to identify and utilize available resources and services will improve Outcome: Progressing Goal: Ability to manage health-related needs will improve Outcome: Progressing   Problem: Metabolic: Goal: Ability to maintain appropriate glucose levels will improve Outcome: Progressing   Problem: Nutritional: Goal: Maintenance of adequate nutrition will improve Outcome: Progressing Goal: Progress toward achieving an optimal weight will improve Outcome: Progressing   Problem: Nutritional: Goal: Progress toward achieving an optimal weight will improve Outcome: Progressing   Problem: Skin Integrity: Goal: Risk for impaired skin integrity will decrease Outcome: Progressing   Problem: Tissue Perfusion: Goal: Adequacy of tissue perfusion will improve Outcome: Progressing   Problem: Education: Goal: Knowledge of General Education information will improve Description: Including pain rating scale, medication(s)/side effects and  non-pharmacologic comfort measures Outcome: Progressing   Problem: Health Behavior/Discharge Planning: Goal: Ability to manage health-related needs will improve Outcome: Progressing   Problem: Clinical Measurements: Goal: Ability to maintain clinical measurements within normal limits will improve Outcome: Progressing Goal: Will remain free from infection Outcome: Progressing Goal: Diagnostic test results will improve Outcome: Progressing Goal: Respiratory complications will improve Outcome: Progressing Goal: Cardiovascular complication will be avoided Outcome: Progressing   Problem: Activity: Goal: Risk for activity intolerance will decrease Outcome: Progressing   Problem: Nutrition: Goal: Adequate nutrition will be maintained Outcome: Progressing   Problem: Coping: Goal: Level of anxiety will decrease Outcome: Progressing   Problem: Elimination: Goal: Will not experience complications related to bowel motility Outcome: Progressing Goal: Will not experience complications related to urinary retention Outcome: Progressing   Problem: Pain Management: Goal: General experience of comfort will improve Outcome: Progressing   Problem: Safety: Goal: Ability to remain free from injury will improve Outcome: Progressing   Problem: Skin Integrity: Goal: Risk for impaired skin integrity will decrease Outcome: Progressing

## 2023-12-19 DIAGNOSIS — N179 Acute kidney failure, unspecified: Secondary | ICD-10-CM | POA: Diagnosis not present

## 2023-12-19 LAB — GLUCOSE, CAPILLARY
Glucose-Capillary: 211 mg/dL — ABNORMAL HIGH (ref 70–99)
Glucose-Capillary: 262 mg/dL — ABNORMAL HIGH (ref 70–99)
Glucose-Capillary: 307 mg/dL — ABNORMAL HIGH (ref 70–99)
Glucose-Capillary: 289 mg/dL — ABNORMAL HIGH (ref 70–99)

## 2023-12-19 MED ORDER — GERHARDT'S BUTT CREAM
TOPICAL_CREAM | Freq: Two times a day (BID) | CUTANEOUS | Status: DC
  Filled 2023-12-19: qty 60

## 2023-12-19 MED ORDER — INSULIN GLARGINE-YFGN 100 UNIT/ML ~~LOC~~ SOLN
28.0000 [IU] | Freq: Every day | SUBCUTANEOUS | Status: DC
  Administered 2023-12-20 – 2023-12-23 (×4): 28 [IU] via SUBCUTANEOUS
  Filled 2023-12-19 (×4): qty 0.28

## 2023-12-19 NOTE — Care Management Important Message (Signed)
 Important Message  Patient Details  Name: Shane [name] MRN: [id number] Date of Birth: [birth year]   Important Message Given:  Yes - Medicare IM     Dorena Bodo 12/19/2023, 1:31 PM

## 2023-12-19 NOTE — Progress Notes (Signed)
 Occupational Therapy Treatment Patient Details Name: Shane [name] MRN: [id number] DOB: 06/03/1941 Today's Date: 12/19/2023   History of present illness 83 year old male presented to ED 1/1 with shortness of breath.  Dx: Covid 19 PNA, Acute kidney injury, Acute hypoxic respiratory failure, Normocytic Anemia - secondary to RP bleed and critical illness, Acute HF with reduced EF, s/p CRRT. With significant past medical history including obstructive sleep apnea on CPAP at night, CAD s/p CABG x3 in 2021, aortic stenosis s/p TAVR 08/2023 at [facility], HFpEF, atrial fibrillation on Eliquis , plavix , sick sinus syndrome s/p leadless PPM, hypertension, hyperlipidemia, GERD, uncontrolled diabetes, CKD 3, pulmonary fibrosis   OT comments  Pt making steady progress towards OT goals this session. Pt continues to present with decreased activity tolerance and generalized weakness . Session focus on increasing activity tolerance, BADL reeducation and functional mobility as precursor to higher level ADLS. Pt able to increase reps of sit>stands today in stedy demonstrating improved functional endurance. Pt currently requires MIN A for sit>stands in stedy, set- up for UB ADLS and MAX A for LB ADLS. Pt continues to be an excellent AIR candidate. Pt would continue to benefit from skilled occupational therapy while admitted and after d/c to address the below listed limitations in order to improve overall functional mobility and facilitate independence with BADL participation. DC plan remains appropriate, will follow acutely per POC.    pt initially on RA but with mobility pt required 2 L to be donned, SpO2 > 96% throughout session, HR max 105 bpm        If plan is discharge home, recommend the following:  A lot of help with walking and/or transfers;Two people to help with walking and/or transfers;A lot of help with bathing/dressing/bathroom;Two people to help with bathing/dressing/bathroom   Equipment Recommendations  None  recommended by OT    Recommendations for Other Services      Precautions / Restrictions Precautions Precautions: Fall Precaution Comments: Lt disolateral foot wound. 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 Wears 2 L O2 at night Required Braces or Orthoses: Other Brace Other Brace: RLE prosthesis. Needs to continue to wear shrinker Restrictions Weight Bearing Restrictions Per Provider Order: No       Mobility Bed Mobility   Bed Mobility: Supine to Sit     Supine to sit: Supervision, HOB elevated, Used rails     General bed mobility comments: supervision for safety, heavy use of bed features    Transfers Overall transfer level: Needs assistance Equipment used: Ambulation equipment used Transfers: Sit to/from Stand Sit to Stand: Min assist, From elevated surface           General transfer comment: pt completed x2 sit>stands from EOB with use of stedy with MINA, pt relies on knee support from stedy in standing and only able to tolerate standing for ~ 30 secs before needing to sit d/t decreased activity tolerance     Balance Overall balance assessment: Needs assistance Sitting-balance support: No upper extremity supported, Feet supported Sitting balance-Leahy Scale: Good     Standing balance support: Single extremity supported, During functional activity Standing balance-Leahy Scale: Fair Standing balance comment: able to complete dynamic reaching in stedy with CGA                           ADL either performed or assessed with clinical judgement   ADL Overall ADL's : Needs assistance/impaired     Grooming: Oral care;Sitting;Set up Grooming Details (indicate cue type  and reason): from EOB             Lower Body Dressing: Maximal assistance;Sitting/lateral leans Lower Body Dressing Details (indicate cue type and reason): to don prosthetic from EOB   Toilet Transfer Details (indicate cue type and reason): sit>stands only in stedy with MIN A to stand          Functional mobility during ADLs: Minimal assistance (sit>stands in stedy only) General ADL Comments: ADL participation impacted by decreased activity tolerance and global deconditioning    Extremity/Trunk Assessment Upper Extremity Assessment Upper Extremity Assessment: Generalized weakness   Lower Extremity Assessment Lower Extremity Assessment: RLE deficits/detail;LLE deficits/detail RLE Deficits / Details: Hx Rt BKA; instructed to wear shrinker during admission LLE Deficits / Details: Bandaged wound on distolateral Lt foot.        Vision Baseline Vision/History: 1 Wears glasses Ability to See in Adequate Light: 0 Adequate Patient Visual Report: No change from baseline (per gross assessment)     Perception Perception Perception: Not tested   Praxis Praxis Praxis: Not tested    Cognition Arousal: Alert Behavior During Therapy: [facility] for tasks assessed/performed Overall Cognitive Status: Within Functional Limits for tasks assessed                                          Exercises      Shoulder Instructions       General Comments wife present during session. pt initially on RA but with mobility pt required 2 L to be donned, SpO2 > 96% throughout session, reviewed IS usage with pt reporting compliance , education provided on pursed lip breathing techniques    Pertinent Vitals/ Pain       Pain Assessment Pain Assessment: 0-10 Pain Score: 3  Pain Location: chest from coughing Pain Descriptors / Indicators: Sore Pain Intervention(s): Monitored during session  Home Living                                          Prior Functioning/Environment              Frequency  Min 1X/week        Progress Toward Goals  OT Goals(current goals can now be found in the care plan section)  Progress towards OT goals: Progressing toward goals  Acute Rehab OT Goals Patient Stated Goal: to go to rehab OT Goal Formulation: With  patient/family Time For Goal Achievement: 12/26/23 Potential to Achieve Goals: Good  Plan      Co-evaluation                 AM-PAC OT 6 Clicks Daily Activity     Outcome Measure   Help from another person eating meals?: None Help from another person taking care of personal grooming?: A Little Help from another person toileting, which includes using toliet, bedpan, or urinal?: A Lot Help from another person bathing (including washing, rinsing, drying)?: A Lot Help from another person to put on and taking off regular upper body clothing?: A Little Help from another person to put on and taking off regular lower body clothing?: A Lot 6 Click Score: 16    End of Session Equipment Utilized During Treatment: Gait belt;Oxygen;Other (comment) (2L; stedy)  OT Visit Diagnosis: Unsteadiness on feet (R26.81);Other abnormalities of gait and  mobility (R26.89);Muscle weakness (generalized) (M62.81)   Activity Tolerance Patient tolerated treatment well   Patient Left in bed;with call bell/phone within reach;with family/visitor present   Nurse Communication Mobility status;Other (comment) (via secure chat)        Time: 9249-9185 OT Time Calculation (min): 24 min  Charges: OT General Charges $OT Visit: 1 Visit OT Treatments $Self Care/Home Management : 23-37 mins  Ronal Mallie POUR., COTA/L Acute Rehabilitation Services [phone]   Ronal Mallie Needy 12/19/2023, 8:35 AM

## 2023-12-19 NOTE — Progress Notes (Signed)
 Inpatient Rehab Admissions Coordinator:   Received a request for P2P from home and community care/[employer].  Dr. Babs completed this AM and pt was denied for CIR by insurance.  I spoke to pt at bedside and he is in agreement to appeal decision.  I will start this today.    Reche Lowers, PT, DPT Admissions Coordinator [phone] 12/19/23  10:51 AM

## 2023-12-19 NOTE — Progress Notes (Signed)
 Progress Note   Patient: Shane [name] FMW:[id number] DOB: 04-16-1941 DOA: 12/06/2023     13 DOS: the patient was seen and examined on 12/19/2023   Brief hospital course: 83 year old male with significant past medical history including obstructive sleep apnea on CPAP at night, CAD s/p CABG x3 in 2021, aortic stenosis s/p TAVR 08/2023 at [facility], HFpEF, atrial fibrillation on Eliquis , plavix , sick sinus syndrome s/p leadless PPM, hypertension, hyperlipidemia, GERD, uncontrolled diabetes, CKD 3, pulmonary fibrosis followed by Dr. Geronimo, who presented to the emergency department on 12/06/2023 with shortness of breath. Per the ED provider complaining of cough and shortness of breath over the past 3 days. Using his nebulizers at home with no improvement in his symptoms. EMS was called and found to be hypotensive and hypoxic. He was started on nonrebreather and an epinephrine  infusion prior to arrival. In the ED afebrile, SBP around 100, tachypneic. Labs +COVID, AKI on CKD with BUN/Cr 116, 4.69 with critical K of 7.1. bicarb 16. CBC without leukocytosis, hgb 9.5. lactic 2.3>1.9, troponin 8,290, UA negative, blood cultures pending. CXR with likely pulmonary edema. With new AKI, hyperkalemia and EKG changes, nephrology was consulted. Patient given lokelma , albuterol , Lasix , sodium bicarb, 5U insulin  with D50, calcium  gluconate. Was also given azithromycin /rocephin  CAP coverage. Neprhology recommended albumin  for BP support (initially on 2mcg levo, now off), Lasix  80mg  IV, renal ultrasound. Pending repeat BMP on consult for admission to ICU.     Significant events: 1/1: Admit to CCM for covid pneumonia, aki with hyperK likely needing CRRT  Renal consultation -presented with a creatinine of 4.  7 mg percent.  [Baseline July 2024 was 1.8 mg percent] 1/2: CRRT initiated, Card Consult due to elev Troponin (Nstemi vs Demand Ischemia)  Cardiology consultation -ejection fraction 40% compared to baseline 55%. 1/3:  Continuing CRRT till this afternoon, PCCM sign off this afternoon, trx to [facility] tomorrow  -> heparin  being continuedFor total 48 hours.  Cardiology recommended continuing Plavix  but holding aspirin .  Cardiology recommending Toprol  once blood pressure stabilized.      Significant studies: 1/1 admission CXR: bilateral infiltrates c/w CHF 1/1 US  renal: Unremarkable  1/2 Echo: EF 40%, RWMA noted, reduced RVSF, moderate MR, TAVR 1/4 CT abdomen and pelvis: new active right RP bleed, chronic ILD   Significant microbiology data: 1/1 COVID + 1/1 Blood cx x2: NG    Procedures: 1/2 CRRT    Consults: Nephrology General Surgery  IR Gastroenterology   Assessment and Plan: #1 acute on chronic hypoxic respiratory failure likely multifactorial secondary to acute COVID-19 infection, in the setting of pulmonary edema secondary to acute systolic CHF exacerbation in the setting of underlying pulmonary fibrosis -Patient noted to be on baseline O2 2 L nasal cannula at bedtime PTA -Patient noted to have presented with cough, shortness of breath x 3 days with no improvement on home nebulizers. -EMS noted on presentation patient to be hypoxic. -Patient states wife and grandson had been sick with COVID infection recently. -MRSA PCR negative. -SARS coronavirus 2 PCR positive, influenza A and B by PCR negative. -Initial chest x-ray findings concerning for CHF exacerbation. -Patient initially received a dose of IV azithromycin  and Rocephin  x 1. -Patient status post 3-day course of remdesivir  and currently on Decadron . -Patient also received IV Lasix  with diuresis. -Patient also noted to have undergone CRRT during the hospitalization. -weaned o2 to RA -Hycodan as needed. -Patient with clinical improvement. HD tunneled cath removed per Nephro -now on oral bid lasix  -Pt has completed over 10 days  of isolation.  -Awaiting insurance auth for CIR   2.  Non-STEMI -Patient on admission noted to have elevated  troponins peaking at 8-9 0 and subsequently trending down. -Felt likely secondary to demand ischemia in the setting of COVID-19 infection with underlying cardiomyopathy CAD/history of CABG. -Patient denies any chest pain. -2D echo obtained with EF of 40%,WMA, moderately dilated left atrial size, mildly dilated right atrial size, status post bioprosthetic aortic valve/status post TAVR. -Patient seen in consultation by cardiology who discussed with patient and decision made to treat medically with IV heparin  for 48 hours and continue medical therapy. -IV heparin  was later discontinued due to spontaneous retroperitoneal bleed. -Later, I discussed with Cardiology , although not ideal, ok to hold asa as well at this point given bleeding risk   3.  Acute retroperitoneal bleed -Noted on CT abdomen and pelvis with contrast 12/09/2023. -Patient status post transfusion of 1 unit platelets, 2 units PRBCs, thiamine, DDAVP . -Patient noted to have started on aspirin  which has subsequently been discontinued. -Anticoagulation for thromboembolic prophylaxis given A-fib on hold due to risk of bleeding. -Hemoglobin currently at 9.1 from 7.0 after transfusion of 2 units PRBCs on 12/12/2023.   -Hemoglobin noted to have been 8.0 on 12/11/2023. -Due to presentation with non-STEMI and cardiac history patient was transfused 2 units PRBCs on 12/12/2023.  -Repeat CT abdomen and pelvis (12/12/2023 ), with redemonstrated large right retroperitoneal hematoma with hematocrit level.  Hematoma measures slightly larger compared to prior exam.  Increased bilateral pleural effusions with hazy edema or infiltrates at the lung bases.  Cardiomegaly.   -Hgb now stable   4.  Paroxysmal A-fib -Currently rate controlled. -Anticoagulation on hold secondary to retroperitoneal bleed as risk of bleeding outweighs benefits at this time and per cardiology recommendations. -Outpatient follow-up with primary cardiologist for possible evaluation for  Watchman versus retrial per cardiology. -Per cardiology.   5.  History of complete heart block/SSS/status post pacemaker implantation -Stable. -Per cardiology.   6.  Hyperlipidemia -Statin.   7.  CAD status post CABG/status post TAVR -Patient noted to have last dose of Plavix  12/08/2023. -Patient was to be started on aspirin  12/11/2023 per cardiology recommendations however due to drop in hemoglobin aspirin  was also discontinued. -No chest pain today   8.  Acute kidney injury -Felt likely hemodynamically mediated from combination of CHF exacerbation, acute infection with evolution to ATN in the setting of ACE inhibitor -Patient being followed by nephrology and underwent CRRT 1/2-1/3. -CRRT paused but restarted 12/09/2023 in the setting of retroperitoneal bleed and stopped on 12/11/2023. -Patient with urine output of 2.790 L over the past 24 hours. -Renal function now normalized. -Continue to hold ACE inhibitor   9.  Hyperkalemia -Likely secondary to problem #8 -Resolved.    10.  Anion gap metabolic acidosis -Likely secondary to acute kidney injury. -Improved with CRRT. -Per nephrology.   11.  Shock -Not largely cardiogenic but cannot rule out a septic component.-Patien -Status post dose of antibiotic coverage for community-acquired pneumonia and status post treatment for COVID infection. -Shock resolved.   12.  Normocytic anemia/acute blood loss anemia -Likely secondary to retroperitoneal bleed and acute illness. -See above, retroperitoneal bleed. -Not ideal, but ok to hold ASA as well as anticoagulation at this time given increased risk of life threatening bleeding   13.  Dysphagia -Patient seen in consultation by GI, patient noted to have some intermittent dysphagia mostly to solids, dense bread and meat occasionally to liquids. -GI and patient deferring any procedures at  this point  -pt to f/u with GI as outpatient to further work up dysphagia.   14.  Diabetes mellitus type  2 -Hemoglobin A1c 8.5 (12/06/2023) -Patient noted to be on Farxiga , Tresiba  20 units daily, prior to admission. -Patient presenting acute kidney injury, Farxiga  on hold. -on 25 units semglee  with 3 units meal coverage. Glucose remains poorly controlled -Increase semglee  to 28 units, continue SSI, Neurontin .   15. Agitation, anxiety -Recent insomnia reported over the weekend  -Pt will need to maintain a good day/night cycle -Resumed at bedtime ativan  and trazadone.  -Pt slept much better and feels much better today      Subjective: Very eager to go to rehab soon. Had been feeling constipated. Reports rectal area feels raw from multiple large BM recently  Physical Exam: Vitals:   12/18/23 1952 12/18/23 2100 12/19/23 0103 12/19/23 0746  BP: (!) 109/57  104/74 (!) 109/58  Pulse: 74 76 71 72  Resp: 18 18 20 18   Temp: 99.1 F (37.3 C)  97.6 F (36.4 C)   TempSrc: Oral  Oral   SpO2: 100% 99% 96% 100%  Weight:      Height:       General exam: Awake, laying in bed, in nad Respiratory system: Normal respiratory effort, no wheezing Cardiovascular system: regular rate, s1, s2 Gastrointestinal system: Soft, nondistended, positive BS Central nervous system: CN2-12 grossly intact, strength intact Extremities: Perfused, no clubbing Skin: Normal skin turgor, no notable skin lesions seen Psychiatry: Mood normal // no visual hallucinations   Data Reviewed:  There are no new results to review at this time.  Family Communication: Pt in room, family at bedside  Disposition: Status is: Inpatient Remains inpatient appropriate because: severity of illness  Planned Discharge Destination: Rehab    Author: Garnette Pelt, MD 12/19/2023 12:43 PM  For on call review [email or web address].

## 2023-12-20 DIAGNOSIS — N179 Acute kidney failure, unspecified: Secondary | ICD-10-CM | POA: Diagnosis not present

## 2023-12-20 LAB — CBC
HCT: 36.5 % — ABNORMAL LOW (ref 39.0–52.0)
Hemoglobin: 11.7 g/dL — ABNORMAL LOW (ref 13.0–17.0)
MCH: 28.3 pg (ref 26.0–34.0)
MCHC: 32.1 g/dL (ref 30.0–36.0)
MCV: 88.4 fL (ref 80.0–100.0)
Platelets: 182 10*3/uL (ref 150–400)
RBC: 4.13 MIL/uL — ABNORMAL LOW (ref 4.22–5.81)
RDW: 16.6 % — ABNORMAL HIGH (ref 11.5–15.5)
WBC: 12 10*3/uL — ABNORMAL HIGH (ref 4.0–10.5)
nRBC: 0 % (ref 0.0–0.2)

## 2023-12-20 LAB — COMPREHENSIVE METABOLIC PANEL
ALT: 22 U/L (ref 0–44)
AST: 26 U/L (ref 15–41)
Albumin: 2.5 g/dL — ABNORMAL LOW (ref 3.5–5.0)
Alkaline Phosphatase: 97 U/L (ref 38–126)
Anion gap: 7 (ref 5–15)
BUN: 41 mg/dL — ABNORMAL HIGH (ref 8–23)
CO2: 34 mmol/L — ABNORMAL HIGH (ref 22–32)
Calcium: 8.6 mg/dL — ABNORMAL LOW (ref 8.9–10.3)
Chloride: 94 mmol/L — ABNORMAL LOW (ref 98–111)
Creatinine, Ser: 1.2 mg/dL (ref 0.61–1.24)
GFR, Estimated: >60 mL/min (ref >60)
Glucose, Bld: 105 mg/dL — ABNORMAL HIGH (ref 70–99)
Potassium: 4.6 mmol/L (ref 3.5–5.1)
Sodium: 135 mmol/L (ref 135–145)
Total Bilirubin: 1.6 mg/dL — ABNORMAL HIGH (ref 0.0–1.2)
Total Protein: 6.2 g/dL — ABNORMAL LOW (ref 6.5–8.1)

## 2023-12-20 LAB — GLUCOSE, CAPILLARY
Glucose-Capillary: 132 mg/dL — ABNORMAL HIGH (ref 70–99)
Glucose-Capillary: 243 mg/dL — ABNORMAL HIGH (ref 70–99)
Glucose-Capillary: 236 mg/dL — ABNORMAL HIGH (ref 70–99)
Glucose-Capillary: 259 mg/dL — ABNORMAL HIGH (ref 70–99)

## 2023-12-20 NOTE — Plan of Care (Signed)
  Problem: Education: Goal: Knowledge of risk factors and measures for prevention of condition will improve 12/20/2023 0832 by Veta Govern, RN Outcome: Progressing 12/20/2023 0832 by Veta Govern, RN Outcome: Progressing   Problem: Coping: Goal: Psychosocial and spiritual needs will be supported 12/20/2023 0832 by Veta Govern, RN Outcome: Progressing 12/20/2023 0832 by Veta Govern, RN Outcome: Progressing   Problem: Respiratory: Goal: Will maintain a patent airway 12/20/2023 0832 by Veta Govern, RN Outcome: Progressing 12/20/2023 0832 by Veta Govern, RN Outcome: Progressing

## 2023-12-20 NOTE — Progress Notes (Addendum)
 PROGRESS NOTE    Shane [name]  GNF:[id number] DOB: 07-16-1941 DOA: 12/06/2023 PCP: Suan Elm, MD   Brief Narrative:  This 83 year old male with significant PMH including obstructive sleep apnea on CPAP at night, CAD s/p CABG x 3 in 2021, aortic stenosis s/p TAVR 08/2023 at [facility], HFpEF, atrial fibrillation on Eliquis , sick sinus syndrome s/p leadless PPM, hypertension, hyperlipidemia, GERD, uncontrolled diabetes, CKD 3A, pulmonary fibrosis followed by Dr. Bertrum Brodie, who presented to the ED on 12/06/2023 with shortness of breath.  He was found to be hypotensive and hypoxic on arrival in ED. He was started on nonrebreather and an epinephrine  infusion prior to arrival. In the ED Labs +COVID, AKI on CKD with BUN/Cr 116, 4.69 with critical K of 7.1. bicarb 16. CBC without leukocytosis, Hgb 9.5. lactic 2.3>1.9, troponin 8,290, UA negative, blood cultures pending. CXR with likely pulmonary edema. With new AKI, hyperkalemia and EKG changes, Nephrology was consulted. Patient given lokelma , albuterol , Lasix , sodium bicarb, 5U insulin  with D50, calcium  gluconate. He was also given azithromycin /rocephin  CAP coverage. Neprhology recommended albumin  for BP support (initially on 2mcg levo, now off), Lasix  80mg  IV, renal ultrasound.  Admitted initially to the ICU.  Significant events: 1/1: Admit to CCM for covid pneumonia, AKI with hyperK likely needing CRRT  Renal consultation -presented with a creatinine of 4.7 mg.  Monnie Anthony July 2024 was 1.8 mg ] 1/2: CRRT initiated, Card Consult due to elev Troponin (Nstemi vs Demand Ischemia)  Cardiology consultation -ejection fraction 40% compared to baseline 55%. 1/3: CRRT discontinued.  PCCM signed off.   1/4; TRH  pickup, Heparin  discontinued after 48 hours.  Cardiology recommended continuing Plavix  but holding aspirin .  Cardiology recommending Toprol  once blood pressure stabilized.    Assessment & Plan:   Principal Problem:   Acute kidney injury (nontraumatic)  (HCC) Active Problems:   COVID-19 virus infection   PAD (peripheral artery disease) (HCC)   Atrial fibrillation (HCC)   Hyperkalemia   Acute on chronic congestive heart failure (HCC)   Non-ST elevation (NSTEMI) myocardial infarction [facility])   Retroperitoneal bleeding   History of transcatheter aortic valve replacement (TAVR)   Chronic heart failure with preserved ejection fraction (HFpEF) (HCC)   Hx of CABG   Atherosclerosis of native coronary artery of native heart without angina pectoris   Acute on chronic hypoxic respiratory failure (HCC)   Anemia   Esophageal dysphagia   COVID-19   Type 2 diabetes mellitus without complication, with long-term current use of insulin  (HCC)  Acute on chronic hypoxic respiratory failure, multifactorial: Likely due to COVID+, pulmonary edema due to acute systolic CHF, underlying pulmonary fibrosis. At baseline patient uses 2 L of supplemental oxygen via [place]. Patient presented with cough, shortness of breath x 3 days with no improvement on home nebulizers. Patient was found to be severely hypoxic requiring nonrebreather on arrival. Patient states his wife and grandson had been sick with COVID infection recently. SARS coronavirus 2 PCR positive, influenza A and B by PCR negative. Initial chest x-ray findings concerning for CHF exacerbation. Patient has completed treatment for COVID with remdesivir  and Decadron . Patient also noted to have undergone CRRT during the hospitalization. Successfully now weaned down to room air. Patient with clinical improvement. HD tunneled cath removed per Nephro Continue Lasix  40 mg p.o. twice daily Patient has completed over 10 days of isolation.  Awaiting insurance auth for CIR   NSTEMI: Suspect secondary to demand ischemia in the setting of COVID-19 infection with underlying cardiomyopathy, Hx of CAD. Patient denies any  chest pain. Echo showed LVEF 40%,WMA, status post bioprosthetic aortic valve/status post  TAVR. Cardiology recommended medical management. He completed IV heparin  for 48 hrs. Heparin  discontinued due to retroperitoneal bleeding. Cardiology states hod aspirin  given bleeding    Acute retroperitoneal bleed: CTA/P showed retroperitoneal bleeding. Status post transfusion of 1 unit platelets, 2 units PRBCs, thiamine, DDAVP . Anticoagulation for thromboembolic prophylaxis given A-fib on hold due to risk of bleeding. Due to presentation with NSTEMI and cardiac history patient was transfused 2 units PRBCs on 12/12/2023.  Repeat CT A/P (12/12/2023 ), with redemonstrated large right retroperitoneal hematoma with hematocrit level.   Hematoma measures slightly larger compared to prior exam. -Hgb now stable.   Paroxysmal A-fib HR now controlled. Anticoagulation on hold secondary to retroperitoneal bleed as risk of bleeding outweighs benefits at this time and per cardiology recommendations. Outpatient follow-up with primary cardiologist for possible evaluation for Watchman versus retrial per cardiology.    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 Follow up outpatient.   Hyperlipidemia Continue Lipitor  80 mg daily.   CAD status post CABG/status post TAVR: Patient was to be started on aspirin  12/11/2023 per cardiology recommendations however due to drop in hemoglobin aspirin  was also discontinued. Not on aspirin  and Plavix .   Acute kidney injury: > Resolved. Patient underwent CRRT 1/2-1/3. CRRT paused but restarted 12/09/2023 in the setting of retroperitoneal bleed and stopped on 12/11/2023. Renal function now normalized. Continue to hold ACE inhibitor   Hyperkalemia > Resolved. Likely secondary to  AKI Resolved.    Anion gap metabolic acidosis: > Resolved. Likely secondary to acute kidney injury. Improved with CRRT. Resolved.   Shock > Resolved. Not largely cardiogenic but cannot rule out a septic component.. Status post dose of antibiotic  coverage for community-acquired pneumonia and status post treatment for COVID infection. Shock resolved.   Normocytic anemia/Acute blood loss anemia: Likely secondary to retroperitoneal bleed and acute illness. retroperitoneal bleed. Not ideal, but ok to hold ASA as well as anticoagulation at this time given increased risk of life threatening bleeding. H&H is stable.   Dysphagia : Patient seen in consultation by GI, noted to have some intermittent dysphagia mostly to solids, dense bread and meat occasionally to liquids. GI and patient deferring any procedures at this point. F/U with GI as outpatient to further work up dysphagia.   Diabetes mellitus type 2 Hemoglobin A1c 8.5 (12/06/2023) Patient noted to be on Farxiga , Tresiba  20 units daily, prior to admission. Patient presenting with acute kidney injury, Farxiga  on hold. Increase semglee  to 28 units, continue SSI, Neurontin .    Agitation, anxiety: Continue ativan  and trazadone.    DVT prophylaxis: Heparin  Code Status: Full code Family Communication: Grand son at bedside Disposition Plan:   Status is: Inpatient Remains inpatient appropriate because: Now medically clear,  awaiting rehab.   Consultants:  Nephrology General Surgery  IR Gastroenterology  Cardiology  Procedures: CRRT  Antimicrobials: Anti-infectives (From admission, onward)    Start     Dose/Rate Route Frequency Ordered Stop   12/07/23 1000  remdesivir  100 mg in sodium chloride  0.9 % 100 mL IVPB       Placed in "Followed by" Linked Group   100 mg 200 mL/hr over 30 Minutes Intravenous Daily 12/06/23 2239 12/08/23 0947   12/06/23 2245  remdesivir  200 mg in sodium chloride  0.9% 250 mL IVPB       Placed in "Followed by" Linked Group   200 mg 580 mL/hr over 30 Minutes Intravenous Once 12/06/23 2239 12/07/23  0142   12/06/23 1815  cefTRIAXone  (ROCEPHIN ) 2 g in sodium chloride  0.9 % 100 mL IVPB        2 g 200 mL/hr over 30 Minutes Intravenous Once 12/06/23 1812  12/06/23 1908   12/06/23 1815  azithromycin  (ZITHROMAX ) 500 mg in sodium chloride  0.9 % 250 mL IVPB        500 mg 250 mL/hr over 60 Minutes Intravenous  Once 12/06/23 1812 12/06/23 2153      Subjective: Patient was seen and examined at bedside.  Overnight events noted.   Patient reports doing much better,  denies any concerns.  Grandson in the room.   Patient reports he is awaiting rehab placement.  Objective: Vitals:   12/19/23 1957 12/20/23 0537 12/20/23 0630 12/20/23 0741  BP: 100/65 (!) 91/56  (!) 102/51  Pulse: 69 66  76  Resp: 18 18  18   Temp: 98.9 F (37.2 C) 98.1 F (36.7 C)  (!) 97.4 F (36.3 C)  TempSrc: Oral   Oral  SpO2: 100% 100%  100%  Weight:   104.1 kg   Height:        Intake/Output Summary (Last 24 hours) at 12/20/2023 1129 Last data filed at 12/20/2023 4540 Gross per 24 hour  Intake 360 ml  Output 1700 ml  Net -1340 ml   Filed Weights   12/17/23 0500 12/18/23 0419 12/20/23 0630  Weight: 103.8 kg 105.4 kg 104.1 kg    Examination:  General exam: Appears calm and comfortable, not in acute distress. Respiratory system: Clear to auscultation. Respiratory effort normal.  RR 16 Cardiovascular system: S1 & S2 heard, RRR. No JVD, murmurs, rubs, gallops or clicks.  Gastrointestinal system: Abdomen is non distended, soft and non tender.  Normal bowel sounds heard. Central nervous system: Alert and oriented x 3. No focal neurological deficits. 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 Skin: No rashes, lesions or ulcers Psychiatry: Judgement and insight appear normal. Mood & affect appropriate.     Data Reviewed: I have personally reviewed following labs and imaging studies  CBC: Recent Labs  Lab 12/14/23 0409 12/15/23 0802 12/17/23 1001 12/18/23 0536 12/20/23 0637  WBC 14.7* 10.7* 13.9* 13.9* 12.0*  HGB 9.7* 9.9* 11.0* 11.8* 11.7*  HCT 29.3* 30.1* 33.7* 35.7* 36.5*  MCV 84.4 85.0 85.8 85.6 88.4  PLT 174 172 166 169 182   Basic Metabolic  Panel: Recent Labs  Lab 12/14/23 0409 12/15/23 0802 12/16/23 0706 12/17/23 1001 12/18/23 0536 12/20/23 0637  NA 132* 134* 131* 132* 132* 135  K 3.9 4.4 4.3 4.1 4.1 4.6  CL 93* 92* 89* 90* 89* 94*  CO2 30 32 32 33* 32 34*  GLUCOSE 237* 327* 332* 252* 160* 105*  BUN 62* 56* 54* 43* 36* 41*  CREATININE 1.43* 1.28* 1.28* 1.15 1.06 1.20  CALCIUM  7.9* 8.0* 8.1* 8.3* 8.3* 8.6*  MG 2.1 1.7 1.4* 1.3* 1.7  --   PHOS 2.5 2.7 3.2  --   --   --    GFR: Estimated Creatinine Clearance: 59.2 mL/min (by C-G formula based on SCr of 1.2 mg/dL). Liver Function Tests: Recent Labs  Lab 12/15/23 0802 12/16/23 0706 12/17/23 1001 12/18/23 0536 12/20/23 0637  AST  --   --  26 25 26   ALT  --   --  23 21 22   ALKPHOS  --   --  93 97 97  BILITOT  --   --  1.8* 1.7* 1.6*  PROT  --   --  6.0*  6.0* 6.2*  ALBUMIN  2.4* 2.6* 2.4* 2.5* 2.5*   No results for input(s): "LIPASE", "AMYLASE" in the last 168 hours. No results for input(s): "AMMONIA" in the last 168 hours. Coagulation Profile: No results for input(s): "INR", "PROTIME" in the last 168 hours. Cardiac Enzymes: No results for input(s): "CKTOTAL", "CKMB", "CKMBINDEX", "TROPONINI" in the last 168 hours. BNP (last 3 results) No results for input(s): "PROBNP" in the last 8760 hours. HbA1C: No results for input(s): "HGBA1C" in the last 72 hours. CBG: Recent Labs  Lab 12/19/23 0746 12/19/23 1147 12/19/23 1636 12/19/23 1956 12/20/23 0742  GLUCAP 211* 262* 307* 289* 132*   Lipid Profile: No results for input(s): "CHOL", "HDL", "LDLCALC", "TRIG", "CHOLHDL", "LDLDIRECT" in the last 72 hours. Thyroid Function Tests: No results for input(s): "TSH", "T4TOTAL", "FREET4", "T3FREE", "THYROIDAB" in the last 72 hours. Anemia Panel: No results for input(s): "VITAMINB12", "FOLATE", "FERRITIN", "TIBC", "IRON ", "RETICCTPCT" in the last 72 hours. Sepsis Labs: No results for input(s): "PROCALCITON", "LATICACIDVEN" in the last 168 hours.  No results found  for this or any previous visit (from the past 240 hours).   Radiology Studies: No results found.  Scheduled Meds:  atorvastatin   80 mg Oral Daily   Chlorhexidine  Gluconate Cloth  6 each Topical Daily   vitamin B-12  1,000 mcg Oral Daily   docusate sodium   100 mg Oral BID   escitalopram   10 mg Oral Daily   feeding supplement (NEPRO CARB STEADY)  237 mL Oral BID BM   furosemide   40 mg Oral BID   gabapentin   300 mg Oral Daily   Gerhardt's butt cream   Topical BID   insulin  aspart  0-20 Units Subcutaneous TID WC   insulin  aspart  0-5 Units Subcutaneous QHS   insulin  aspart  3 Units Subcutaneous TID WC   insulin  glargine-yfgn  28 Units Subcutaneous Daily   multivitamin  1 tablet Oral QHS   pantoprazole   40 mg Oral Daily   polyethylene glycol  17 g Oral TID BM   traZODone   75 mg Oral QHS   zinc  sulfate (50mg  elemental zinc )  220 mg Oral Daily   Continuous Infusions:   LOS: 14 days    Time spent: 50 mins    Magdalene School, MD [facility]   If 7PM-7AM, please contact night-coverage

## 2023-12-20 NOTE — Progress Notes (Signed)
 Inpatient Rehab Admissions Coordinator:   Expedited appeal with [facility] medicare pending.  Will follow.   Loye Rumble, PT, DPT Admissions Coordinator [phone] 12/20/23  10:32 AM

## 2023-12-20 NOTE — Progress Notes (Signed)
 Mobility Specialist: Progress Note   12/20/23 1257  Mobility  Activity Stood at bedside  Level of Assistance +2 (takes two people)  Musician  Activity Response Tolerated well  Mobility Referral Yes  Mobility visit 1 Mobility  Mobility Specialist Start Time (ACUTE ONLY) 1029  Mobility Specialist Stop Time (ACUTE ONLY) 1050  Mobility Specialist Time Calculation (min) (ACUTE ONLY) 21 min    Pt was agreeable to mobility session - received in bed with son at bedside. MS assisted with don/doffing RLE prosthesis. Unable to don post op shoe for L foot. ModA+2 for STS. Pt c/o cramping and muscle fatigue in R thigh during stand, but able to tolerate ~15-20 sec standing before pain gets intolerable. Had a BM in bed, MS assisted with pericare with pt in standing position. Left in bed with all needs met, call bell in reach. RN aware.   Deloria Fetch Mobility Specialist Please contact via SecureChat or Rehab office at [phone]

## 2023-12-20 NOTE — Plan of Care (Signed)
   Problem: Education: Goal: Knowledge of risk factors and measures for prevention of condition will improve Outcome: Progressing   Problem: Coping: Goal: Psychosocial and spiritual needs will be supported Outcome: Progressing   Problem: Respiratory: Goal: Will maintain a patent airway Outcome: Progressing

## 2023-12-21 ENCOUNTER — Ambulatory Visit: Payer: Medicare Other | Admitting: Orthopedic Surgery

## 2023-12-21 DIAGNOSIS — N179 Acute kidney failure, unspecified: Secondary | ICD-10-CM | POA: Diagnosis not present

## 2023-12-21 LAB — CBC
HCT: 34.8 % — ABNORMAL LOW (ref 39.0–52.0)
Hemoglobin: 11 g/dL — ABNORMAL LOW (ref 13.0–17.0)
MCH: 28.2 pg (ref 26.0–34.0)
MCHC: 31.6 g/dL (ref 30.0–36.0)
MCV: 89.2 fL (ref 80.0–100.0)
Platelets: 162 10*3/uL (ref 150–400)
RBC: 3.9 MIL/uL — ABNORMAL LOW (ref 4.22–5.81)
RDW: 16.7 % — ABNORMAL HIGH (ref 11.5–15.5)
WBC: 10.1 10*3/uL (ref 4.0–10.5)
nRBC: 0 % (ref 0.0–0.2)

## 2023-12-21 LAB — BASIC METABOLIC PANEL
Anion gap: 10 (ref 5–15)
BUN: 49 mg/dL — ABNORMAL HIGH (ref 8–23)
CO2: 30 mmol/L (ref 22–32)
Calcium: 8.4 mg/dL — ABNORMAL LOW (ref 8.9–10.3)
Chloride: 91 mmol/L — ABNORMAL LOW (ref 98–111)
Creatinine, Ser: 1.53 mg/dL — ABNORMAL HIGH (ref 0.61–1.24)
GFR, Estimated: 45 mL/min — ABNORMAL LOW (ref >60)
Glucose, Bld: 205 mg/dL — ABNORMAL HIGH (ref 70–99)
Potassium: 4.9 mmol/L (ref 3.5–5.1)
Sodium: 131 mmol/L — ABNORMAL LOW (ref 135–145)

## 2023-12-21 LAB — GLUCOSE, CAPILLARY
Glucose-Capillary: 157 mg/dL — ABNORMAL HIGH (ref 70–99)
Glucose-Capillary: 190 mg/dL — ABNORMAL HIGH (ref 70–99)
Glucose-Capillary: 217 mg/dL — ABNORMAL HIGH (ref 70–99)
Glucose-Capillary: 230 mg/dL — ABNORMAL HIGH (ref 70–99)
Glucose-Capillary: 262 mg/dL — ABNORMAL HIGH (ref 70–99)
Glucose-Capillary: 295 mg/dL — ABNORMAL HIGH (ref 70–99)

## 2023-12-21 LAB — PHOSPHORUS: Phosphorus: 3.8 mg/dL (ref 2.5–4.6)

## 2023-12-21 LAB — MAGNESIUM: Magnesium: 1.6 mg/dL — ABNORMAL LOW (ref 1.7–2.4)

## 2023-12-21 MED ORDER — SODIUM CHLORIDE 0.9 % IV BOLUS
500.0000 mL | Freq: Once | INTRAVENOUS | Status: AC
  Administered 2023-12-21: 500 mL via INTRAVENOUS

## 2023-12-21 MED ORDER — MAGNESIUM SULFATE 2 GM/50ML IV SOLN
2.0000 g | Freq: Once | INTRAVENOUS | Status: AC
  Administered 2023-12-21: 2 g via INTRAVENOUS
  Filled 2023-12-21: qty 50

## 2023-12-21 NOTE — Inpatient Diabetes Management (Signed)
Inpatient Diabetes Program Recommendations  AACE/ADA: New Consensus Statement on Inpatient Glycemic Control (2015)  Target Ranges:  Prepandial:   less than 140 mg/dL      Peak postprandial:   less than 180 mg/dL (1-2 hours)      Critically ill patients:  140 - 180 mg/dL   Lab Results  Component Value Date   GLUCAP 230 (H) 12/21/2023   HGBA1C 8.5 (H) 12/06/2023    Review of Glycemic Control  Latest Reference Range & Units 12/20/23 07:42 12/20/23 11:38 12/20/23 17:19 12/20/23 20:32 12/21/23 00:01 12/21/23 04:14 12/21/23 07:50  Glucose-Capillary 70 - 99 mg/dL 161 (H) 096 (H) 045 (H) 259 (H) 262 (H) 217 (H) 230 (H)   Diabetes history: DM 2 Outpatient Diabetes medications: Farxiga 10 mg Daily, Humalog 10-15 units tid, Tresiba 20 units Daily Current orders for Inpatient glycemic control:  Semglee 28 units Daily  Nepro BID between meals  Inpatient Diabetes Program Recommendations:    Note: Glucose trends still increase after meal intake  -   Increase Novolog meal coverage to 6 units tid if eating >50% of meals  Thanks,  Christena Deem RN, MSN, BC-ADM Inpatient Diabetes Coordinator Team Pager [phone] (8a-5p)

## 2023-12-21 NOTE — Progress Notes (Signed)
Inpatient Rehab Admissions Coordinator:   Continue to await determination from [facility] Medicare on CIR request expedited appeal.   Estill Dooms, PT, DPT Admissions Coordinator [phone] 12/21/23  9:40 AM

## 2023-12-21 NOTE — Plan of Care (Signed)
  Problem: Education: Goal: Knowledge of risk factors and measures for prevention of condition will improve Outcome: Progressing   Problem: Coping: Goal: Psychosocial and spiritual needs will be supported Outcome: Progressing   Problem: Respiratory: Goal: Will maintain a patent airway Outcome: Progressing Goal: Complications related to the disease process, condition or treatment will be avoided or minimized Outcome: Progressing   Problem: Education: Goal: Ability to describe self-care measures that may prevent or decrease complications (Diabetes Survival Skills Education) will improve Outcome: Progressing Goal: Individualized Educational Video(s) Outcome: Progressing   Problem: Coping: Goal: Ability to adjust to condition or change in health will improve Outcome: Progressing   Problem: Fluid Volume: Goal: Ability to maintain a balanced intake and output will improve Outcome: Progressing   Problem: Health Behavior/Discharge Planning: Goal: Ability to identify and utilize available resources and services will improve Outcome: Progressing Goal: Ability to manage health-related needs will improve Outcome: Progressing   Problem: Metabolic: Goal: Ability to maintain appropriate glucose levels will improve Outcome: Progressing   Problem: Nutritional: Goal: Maintenance of adequate nutrition will improve Outcome: Progressing Goal: Progress toward achieving an optimal weight will improve Outcome: Progressing   Problem: Skin Integrity: Goal: Risk for impaired skin integrity will decrease Outcome: Progressing   Problem: Tissue Perfusion: Goal: Adequacy of tissue perfusion will improve Outcome: Progressing   Problem: Education: Goal: Knowledge of General Education information will improve Description: Including pain rating scale, medication(s)/side effects and non-pharmacologic comfort measures Outcome: Progressing   Problem: Health Behavior/Discharge Planning: Goal:  Ability to manage health-related needs will improve Outcome: Progressing   Problem: Clinical Measurements: Goal: Ability to maintain clinical measurements within normal limits will improve Outcome: Progressing Goal: Will remain free from infection Outcome: Progressing Goal: Diagnostic test results will improve Outcome: Progressing Goal: Respiratory complications will improve Outcome: Progressing Goal: Cardiovascular complication will be avoided Outcome: Progressing   Problem: Activity: Goal: Risk for activity intolerance will decrease Outcome: Progressing   Problem: Nutrition: Goal: Adequate nutrition will be maintained Outcome: Progressing   Problem: Coping: Goal: Level of anxiety will decrease Outcome: Progressing   Problem: Elimination: Goal: Will not experience complications related to bowel motility Outcome: Progressing Goal: Will not experience complications related to urinary retention Outcome: Progressing   Problem: Pain Management: Goal: General experience of comfort will improve Outcome: Progressing   Problem: Safety: Goal: Ability to remain free from injury will improve Outcome: Progressing   Problem: Skin Integrity: Goal: Risk for impaired skin integrity will decrease Outcome: Progressing

## 2023-12-21 NOTE — Progress Notes (Signed)
Nutrition Follow-up  DOCUMENTATION CODES:   Not applicable  INTERVENTION:  Encourage adequate PO intake Liberalize diet Continue protein containing snacks TID between meals  Nepro Shake po BID, each supplement provides 425 kcal and 19 grams protein Renal MVI with minerals daily Zinc 220mg  daily x14 days d/t low levels; recheck lab following repletion Recommend Vitamin B12 supplementation as vitamin B12 typically results significantly elevated in acutely ill populations; MD ordered   NUTRITION DIAGNOSIS:   Inadequate oral intake related to chronic illness (CAD, AS, CKD, DM) as evidenced by per patient/family report.  Progressing with interventions in palce  GOAL:   Patient will meet greater than or equal to 90% of their needs    MONITOR:   PO intake, Supplement acceptance, Labs, Weight trends, Diet advancement, I & O's, Skin  REASON FOR ASSESSMENT:   Consult Assessment of nutrition requirement/status (CRRT protocol)  ASSESSMENT:   Pt admitted with AKI and hypotension, found to be COVID+ on admission. 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 Good appetite with no complaints.   RN reports 90-100% of meals being consumed, enjoys snacks and Nepro.    There is no benefit to excessive dietary restrictions related advanced age, increased nutrient needs. Patient would benefit better from liberalized diet to help meet increased nutrient needs and promote better oral intake. Patient pending CIR Significant events: 1/1: Admit to CCM for covid pneumonia, AKI with hyperK likely needing CRRT  Renal consultation -presented with a creatinine of 4.7 mg.  Michele Mcalpine July 2024 was 1.8 mg ] 1/2: CRRT initiated, Card Consult due to elev Troponin (Nstemi vs Demand Ischemia)  Cardiology consultation -ejection fraction 40% compared to baseline 55%. 1/3: CRRT discontinued.  PCCM  signed off.   1/4; TRH  pickup, Heparin discontinued after 48 hours.  Cardiology recommended continuing Plavix but holding aspirin.  Cardiology recommending Toprol once blood pressure stabilized.  1/7; Repeat CT A/P  with redemonstrated large right retroperitoneal hematoma with hematocrit level.   Hematoma measures slightly larger compared to prior exam Hospital weight history: 12/21/23 0408 108.6 kg 239.42 lbs  12/20/23 0630 104.1 kg 229.5 lbs  12/18/23 0419 105.4 kg 232.37 lbs  12/17/23 0500 103.8 kg 228.84 lbs  12/16/23 0119 106.8 kg 235.45 lbs  12/15/23 06:16:55 107.1 kg 236.11 lbs  12/14/23 0500 106 kg 233.69 lbs  12/11/23 0500 108.4 kg 238.98 lbs  12/10/23 0500 107.9 kg 237.88 lbs  12/08/23 0500 108.6 kg 239.42 lbs  12/07/23 0344 109.4 kg 241.18 lbs  12/07/23 0000 109.4 kg 241.18 lbs  12/06/23 1805 102 kg 224.87 lbs   Average Meal Intake: No current documentation.   Nutritionally Relevant Medications: Scheduled Meds:  atorvastatin  80 mg Oral Daily   vitamin B-12  1,000 mcg Oral Daily   docusate sodium  100 mg Oral BID   feeding supplement (NEPRO CARB STEADY)  237 mL Oral BID BM   furosemide  40 mg Oral BID   gabapentin  300 mg Oral Daily   multivitamin  1 tablet Oral QHS   traZODone  75 mg Oral QHS   zinc sulfate (50mg  elemental zinc)  220 mg Oral Daily    Continuous Infusions:  magnesium sulfate bolus IVPB 2 g (12/21/23 1432)   PRN Meds:.acetaminophen, heparin sodium (porcine), HYDROcodone bit-homatropine, HYDROmorphone (DILAUDID) injection, ipratropium-albuterol, LORazepam, LORazepam, melatonin, ondansetron (ZOFRAN) IV, mouth rinse, oxyCODONE, phenol  Labs Reviewed    NUTRITION -  FOCUSED PHYSICAL EXAM:  Flowsheet Row Most Recent Value  Orbital Region No depletion  Upper Arm Region Mild depletion  Thoracic and Lumbar Region No depletion  Buccal Region No depletion  Temple Region No depletion  Clavicle Bone Region Mild depletion  Clavicle and Acromion Bone  Region No depletion  Scapular Bone Region No depletion  Dorsal Hand Unable to assess  [edema]  Patellar Region Moderate depletion  Anterior Thigh Region Mild depletion  Posterior Calf Region Unable to assess  [R BKA]  Edema (RD Assessment) Mild  [hand edema]  Hair Reviewed  Eyes Reviewed  Mouth Reviewed  Skin Reviewed  Nails Reviewed       Diet Order:   Diet Order             Diet Carb Modified Fluid consistency: Thin  Diet effective now                   EDUCATION NEEDS:   Education needs have been addressed  Skin:  Skin Assessment: Reviewed RN Assessment Skin Integrity Issues:: Other (Comment) Other: L foot non-pressure wound 2.5cm x 1.5 cm  Last BM:  1/15 type 6  Height:   Ht Readings from Last 1 Encounters:  12/06/23 6' (1.829 m)    Weight:   Wt Readings from Last 1 Encounters:  12/21/23 108.6 kg    Ideal Body Weight:  80.9 kg  BMI:  Body mass index is 32.47 kg/m.  Estimated Nutritional Needs:   Kcal:  2000-2200  Protein:  130-145g  Fluid:  1L + [name]    Jamelle Haring RDN, LDN Clinical Dietitian   If unable to reach, please contact "RD Inpatient" secure chat group between 8 am-4 pm daily"

## 2023-12-21 NOTE — Progress Notes (Signed)
PT Cancellation Note  Patient Details Name: Shane [name] MRN: [id number] DOB: Jan 17, 1941   Cancelled Treatment:    Reason Eval/Treat Not Completed: Other (comment)  States he has called for pain medication, Rt flank hurting quite a bit this afternoon. Requests we follow-up later to work on transfers, however due to it being late in the afternoon we will likely need to follow-up tomorrow. Pt verbalized understanding.  Kathlyn Sacramento, PT, DPT [facility]  Rehabilitation Services Physical Therapist Office: [phone] Website: [email or web address]    [name] 12/21/2023, 3:12 PM

## 2023-12-21 NOTE — Progress Notes (Signed)
PROGRESS NOTE    Shane [name]  RUE:[id number] DOB: [birth year] DOA: 12/06/2023 PCP: [name], MD   Brief Narrative:  This 83 year old male with significant PMH including obstructive sleep apnea on CPAP at night, CAD s/p CABG x 3 in 2021, aortic stenosis s/p TAVR 08/2023 at [facility], HFpEF, atrial fibrillation on Eliquis, sick sinus syndrome s/p leadless PPM, hypertension, hyperlipidemia, GERD, uncontrolled diabetes, CKD 3A, pulmonary fibrosis followed by Dr. Marchelle Gearing, who presented to the ED on 12/06/2023 with shortness of breath.  He was found to be hypotensive and hypoxic on arrival in ED. He was started on nonrebreather and an epinephrine infusion prior to arrival. In the ED Labs +COVID, AKI on CKD with BUN/Cr 116, 4.69 with critical K of 7.1. bicarb 16. CBC without leukocytosis, Hgb 9.5. lactic 2.3>1.9, troponin 8,290, UA negative, blood cultures pending. CXR with likely pulmonary edema. With new AKI, hyperkalemia and EKG changes, Nephrology was consulted. Patient given lokelma, albuterol, Lasix, sodium bicarb, 5U insulin with D50, calcium gluconate. He was also given azithromycin/rocephin CAP coverage. Neprhology recommended albumin for BP support (initially on levo, now off), Lasix 80mg  IV, renal ultrasound.  Admitted initially to the ICU.  Significant events: 1/1: Admit to CCM for covid pneumonia, AKI with hyperK likely needing CRRT  Renal consultation -presented with a creatinine of 4.7 mg.  Michele Mcalpine July 2024 was 1.8 mg ] 1/2: CRRT initiated, Card Consult due to elev Troponin (Nstemi vs Demand Ischemia)  Cardiology consultation -ejection fraction 40% compared to baseline 55%. 1/3: CRRT discontinued.  PCCM signed off.   1/4; TRH  pickup, Heparin discontinued after 48 hours.  Cardiology recommended continuing Plavix but holding aspirin.  Cardiology recommending Toprol once blood pressure stabilized.    Assessment & Plan:   Principal Problem:   Acute kidney injury (nontraumatic)  (HCC) Active Problems:   COVID-19 virus infection   PAD (peripheral artery disease) (HCC)   Atrial fibrillation (HCC)   Hyperkalemia   Acute on chronic congestive heart failure (HCC)   Non-ST elevation (NSTEMI) myocardial infarction [facility])   Retroperitoneal bleeding   History of transcatheter aortic valve replacement (TAVR)   Chronic heart failure with preserved ejection fraction (HFpEF) (HCC)   Hx of CABG   Atherosclerosis of native coronary artery of native heart without angina pectoris   Acute on chronic hypoxic respiratory failure (HCC)   Anemia   Esophageal dysphagia   COVID-19   Type 2 diabetes mellitus without complication, with long-term current use of insulin (HCC)  Acute on chronic hypoxic respiratory failure, multifactorial: Likely due to COVID+, pulmonary edema due to acute systolic CHF, underlying pulmonary fibrosis. At baseline patient uses 2 L of supplemental oxygen via [place]. Patient presented with cough, shortness of breath x 3 days with no improvement on home nebulizers. Patient was found to be severely hypoxic requiring nonrebreather on arrival. Patient states his wife and grandson had been sick with COVID infection recently. SARS coronavirus 2 PCR positive, influenza A and B by PCR negative. Initial chest x-ray findings concerning for CHF exacerbation. Patient has completed treatment for COVID with remdesivir and Decadron. Patient has undergone brief CRRT during this hospitalization. Successfully now weaned down to room air. Patient with clinical improvement. HD tunneled cath removed per Nephro Continue Lasix 40 mg p.o. twice daily Patient has completed over 10 days of isolation.  Awaiting insurance auth for CIR   NSTEMI: Suspect secondary to demand ischemia in the setting of COVID-19 infection with underlying cardiomyopathy, Hx of CAD. Patient denies any chest pain.  Echo showed LVEF 40%,WMA, status post bioprosthetic aortic valve /status post TAVR. Cardiology  recommended medical management. He completed IV heparin for 48 hrs. Heparin discontinued due to retroperitoneal bleeding. Cardiology states holding aspirin given bleeding.  Acute retroperitoneal bleed: CTA/P showed retroperitoneal bleeding. Status post transfusion of 1 unit platelets, 2 units PRBCs, thiamine, DDAVP. Anticoagulation for thromboembolic prophylaxis given A-fib on hold due to risk of bleeding. Due to presentation with NSTEMI and cardiac history patient was transfused 2 units PRBCs on 12/12/2023.  Repeat CT A/P (12/12/2023 ), with redemonstrated large right retroperitoneal hematoma with hematocrit level.   Hematoma measures slightly larger compared to prior exam. -Hgb now stable.   Paroxysmal A-fib HR now controlled. Anticoagulation on hold secondary to retroperitoneal bleed as risk of bleeding outweighs benefits at this time and per cardiology recommendations. Outpatient follow-up with primary cardiologist for possible evaluation for Watchman versus retrial per cardiology.   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 Follow up outpatient.   Hyperlipidemia Continue Lipitor 80 mg daily.   CAD status post CABG/status post TAVR: Patient was to be started on aspirin 12/11/2023 per cardiology recommendations however due to drop in hemoglobin aspirin was also discontinued. Not on aspirin and Plavix.   Acute kidney injury:  Patient underwent CRRT 1/2-1/3. CRRT paused but restarted 12/09/2023 in the setting of retroperitoneal bleed and stopped on 12/11/2023. Renal function now improved . Continue to hold ACE inhibitor. Serum creatinine slightly up today 1.53.  Consider NS bolus.  Hyperkalemia > Resolved. Likely secondary to  AKI Resolved.    Anion gap metabolic acidosis: > Resolved. Likely secondary to acute kidney injury. Improved with CRRT. Resolved.   Shock > Resolved. Not largely cardiogenic but cannot rule out a septic  component.. Status post dose of antibiotic coverage for community-acquired pneumonia and status post treatment for COVID infection. Shock resolved.   Normocytic anemia/Acute blood loss anemia: Likely secondary to retroperitoneal bleed and acute illness. retroperitoneal bleed. Not ideal, but ok to hold ASA as well as anticoagulation at this time given increased risk of life threatening bleeding. H&H is stable.   Dysphagia : Patient seen in consultation by GI, noted to have some intermittent dysphagia mostly to solids, dense bread and meat occasionally to liquids. GI and patient deferring any procedures at this point. F/U with GI as outpatient to further work up dysphagia.   Diabetes mellitus type 2 Hemoglobin A1c 8.5 (12/06/2023) Patient noted to be on Farxiga, Tresiba 20 units daily, prior to admission. Patient presenting with acute kidney injury, Farxiga on hold. Increase semglee to 28 units, continue SSI, Neurontin.    Agitation, anxiety: Continue ativan and trazadone.    DVT prophylaxis: Heparin Code Status: Full code Family Communication: Grand son at bedside Disposition Plan:   Status is: Inpatient Remains inpatient appropriate because: Now medically clear,  awaiting rehab authorization..   Consultants:  Nephrology General Surgery  IR Gastroenterology  Cardiology  Procedures: CRRT  Antimicrobials: Anti-infectives (From admission, onward)    Start     Dose/Rate Route Frequency Ordered Stop   12/07/23 1000  remdesivir 100 mg in sodium chloride 0.9 % 100 mL IVPB       Placed in "Followed by" Linked Group   100 mg 200 mL/hr over 30 Minutes Intravenous Daily 12/06/23 2239 12/08/23 0947   12/06/23 2245  remdesivir 200 mg in sodium chloride 0.9% 250 mL IVPB       Placed in "Followed by" Linked Group   200 mg 580 mL/hr over 30  Minutes Intravenous Once 12/06/23 2239 12/07/23 0142   12/06/23 1815  cefTRIAXone (ROCEPHIN) 2 g in sodium chloride 0.9 % 100 mL IVPB        2  g 200 mL/hr over 30 Minutes Intravenous Once 12/06/23 1812 12/06/23 1908   12/06/23 1815  azithromycin (ZITHROMAX) 500 mg in sodium chloride 0.9 % 250 mL IVPB        500 mg 250 mL/hr over 60 Minutes Intravenous  Once 12/06/23 1812 12/06/23 2153      Subjective: Patient was seen and examined at bedside.  Overnight events noted.   Patient reports feeling much improved. Reports having right flank pain due to hematoma. Patient reports he is awaiting rehab placement.  Objective: Vitals:   12/20/23 2359 12/21/23 0405 12/21/23 0408 12/21/23 0752  BP: 101/61 101/81  (!) 120/53  Pulse: 65 65  71  Resp: 18 18  18   Temp: (!) 97.5 F (36.4 C) 97.6 F (36.4 C)  98 F (36.7 C)  TempSrc: Oral Oral    SpO2: 100% 100%  98%  Weight:   108.6 kg   Height:        Intake/Output Summary (Last 24 hours) at 12/21/2023 1105 Last data filed at 12/21/2023 0004 Gross per 24 hour  Intake --  Output 500 ml  Net -500 ml   Filed Weights   12/18/23 0419 12/20/23 0630 12/21/23 0408  Weight: 105.4 kg 104.1 kg 108.6 kg    Examination:  General exam: Appears calm and comfortable, not in acute distress.  Deconditioned Respiratory system: Clear to auscultation. Respiratory effort normal.  RR 14 Cardiovascular system: S1 & S2 heard, RRR. No JVD, murmurs, rubs, gallops or clicks.  Gastrointestinal system: Abdomen is non distended, soft and non tender.  Normal bowel sounds heard. Central nervous system: Alert and oriented x 3. No focal neurological deficits. 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 Skin: No rashes, lesions or ulcers Psychiatry: Judgement and insight appear normal. Mood & affect appropriate.     Data Reviewed: I have personally reviewed following labs and imaging studies  CBC: Recent Labs  Lab 12/15/23 0802 12/17/23 1001 12/18/23 0536 12/20/23 0637 12/21/23 0635  WBC 10.7* 13.9* 13.9* 12.0* 10.1  HGB 9.9* 11.0* 11.8* 11.7* 11.0*  HCT 30.1* 33.7* 35.7* 36.5* 34.8*  MCV 85.0  85.8 85.6 88.4 89.2  PLT 172 166 169 182 162   Basic Metabolic Panel: Recent Labs  Lab 12/15/23 0802 12/16/23 0706 12/17/23 1001 12/18/23 0536 12/20/23 0637 12/21/23 0635  NA 134* 131* 132* 132* 135 131*  K 4.4 4.3 4.1 4.1 4.6 4.9  CL 92* 89* 90* 89* 94* 91*  CO2 32 32 33* 32 34* 30  GLUCOSE 327* 332* 252* 160* 105* 205*  BUN 56* 54* 43* 36* 41* 49*  CREATININE 1.28* 1.28* 1.15 1.06 1.20 1.53*  CALCIUM 8.0* 8.1* 8.3* 8.3* 8.6* 8.4*  MG 1.7 1.4* 1.3* 1.7  --  1.6*  PHOS 2.7 3.2  --   --   --  3.8   GFR: Estimated Creatinine Clearance: 47.4 mL/min (A) (by C-G formula based on SCr of 1.53 mg/dL (H)). Liver Function Tests: Recent Labs  Lab 12/15/23 0802 12/16/23 0706 12/17/23 1001 12/18/23 0536 12/20/23 0637  AST  --   --  26 25 26   ALT  --   --  23 21 22   ALKPHOS  --   --  93 97 97  BILITOT  --   --  1.8* 1.7* 1.6*  PROT  --   --  6.0* 6.0* 6.2*  ALBUMIN 2.4* 2.6* 2.4* 2.5* 2.5*   No results for input(s): "LIPASE", "AMYLASE" in the last 168 hours. No results for input(s): "AMMONIA" in the last 168 hours. Coagulation Profile: No results for input(s): "INR", "PROTIME" in the last 168 hours. Cardiac Enzymes: No results for input(s): "CKTOTAL", "CKMB", "CKMBINDEX", "TROPONINI" in the last 168 hours. BNP (last 3 results) No results for input(s): "PROBNP" in the last 8760 hours. HbA1C: No results for input(s): "HGBA1C" in the last 72 hours. CBG: Recent Labs  Lab 12/20/23 1719 12/20/23 2032 12/21/23 0001 12/21/23 0414 12/21/23 0750  GLUCAP 236* 259* 262* 217* 230*   Lipid Profile: No results for input(s): "CHOL", "HDL", "LDLCALC", "TRIG", "CHOLHDL", "LDLDIRECT" in the last 72 hours. Thyroid Function Tests: No results for input(s): "TSH", "T4TOTAL", "FREET4", "T3FREE", "THYROIDAB" in the last 72 hours. Anemia Panel: No results for input(s): "VITAMINB12", "FOLATE", "FERRITIN", "TIBC", "IRON", "RETICCTPCT" in the last 72 hours. Sepsis Labs: No results for  input(s): "PROCALCITON", "LATICACIDVEN" in the last 168 hours.  No results found for this or any previous visit (from the past 240 hours).   Radiology Studies: No results found.  Scheduled Meds:  atorvastatin  80 mg Oral Daily   Chlorhexidine Gluconate Cloth  6 each Topical Daily   vitamin B-12  1,000 mcg Oral Daily   docusate sodium  100 mg Oral BID   escitalopram  10 mg Oral Daily   feeding supplement (NEPRO CARB STEADY)  237 mL Oral BID BM   furosemide  40 mg Oral BID   gabapentin  300 mg Oral Daily   Gerhardt's butt cream   Topical BID   insulin aspart  0-20 Units Subcutaneous TID WC   insulin aspart  0-5 Units Subcutaneous QHS   insulin aspart  3 Units Subcutaneous TID WC   insulin glargine-yfgn  28 Units Subcutaneous Daily   multivitamin  1 tablet Oral QHS   pantoprazole  40 mg Oral Daily   polyethylene glycol  17 g Oral TID BM   traZODone  75 mg Oral QHS   zinc sulfate (50mg  elemental zinc)  220 mg Oral Daily   Continuous Infusions:  magnesium sulfate bolus IVPB       LOS: 15 days    Time spent: 35 mins    Willeen Niece, MD [facility]   If 7PM-7AM, please contact night-coverage

## 2023-12-22 DIAGNOSIS — N179 Acute kidney failure, unspecified: Secondary | ICD-10-CM | POA: Diagnosis not present

## 2023-12-22 LAB — CBC
HCT: 32.8 % — ABNORMAL LOW (ref 39.0–52.0)
Hemoglobin: 10.5 g/dL — ABNORMAL LOW (ref 13.0–17.0)
MCH: 28.3 pg (ref 26.0–34.0)
MCHC: 32 g/dL (ref 30.0–36.0)
MCV: 88.4 fL (ref 80.0–100.0)
Platelets: 149 10*3/uL — ABNORMAL LOW (ref 150–400)
RBC: 3.71 MIL/uL — ABNORMAL LOW (ref 4.22–5.81)
RDW: 16.7 % — ABNORMAL HIGH (ref 11.5–15.5)
WBC: 8.7 10*3/uL (ref 4.0–10.5)
nRBC: 0 % (ref 0.0–0.2)

## 2023-12-22 LAB — GLUCOSE, CAPILLARY
Glucose-Capillary: 246 mg/dL — ABNORMAL HIGH (ref 70–99)
Glucose-Capillary: 304 mg/dL — ABNORMAL HIGH (ref 70–99)
Glucose-Capillary: 243 mg/dL — ABNORMAL HIGH (ref 70–99)
Glucose-Capillary: 239 mg/dL — ABNORMAL HIGH (ref 70–99)

## 2023-12-22 LAB — BASIC METABOLIC PANEL
Anion gap: 7 (ref 5–15)
BUN: 45 mg/dL — ABNORMAL HIGH (ref 8–23)
CO2: 30 mmol/L (ref 22–32)
Calcium: 8.4 mg/dL — ABNORMAL LOW (ref 8.9–10.3)
Chloride: 92 mmol/L — ABNORMAL LOW (ref 98–111)
Creatinine, Ser: 1.28 mg/dL — ABNORMAL HIGH (ref 0.61–1.24)
GFR, Estimated: 56 mL/min — ABNORMAL LOW (ref >60)
Glucose, Bld: 270 mg/dL — ABNORMAL HIGH (ref 70–99)
Potassium: 4.5 mmol/L (ref 3.5–5.1)
Sodium: 129 mmol/L — ABNORMAL LOW (ref 135–145)

## 2023-12-22 LAB — PHOSPHORUS: Phosphorus: 2.8 mg/dL (ref 2.5–4.6)

## 2023-12-22 LAB — MAGNESIUM: Magnesium: 1.9 mg/dL (ref 1.7–2.4)

## 2023-12-22 MED ORDER — INSULIN ASPART 100 UNIT/ML IJ SOLN: 4.0000 [IU] | Freq: Three times a day (TID) | INTRAMUSCULAR | Status: DC

## 2023-12-22 MED ORDER — INSULIN ASPART 100 UNIT/ML IJ SOLN
4.0000 [IU] | Freq: Three times a day (TID) | INTRAMUSCULAR | Status: DC
Start: 2023-12-22 — End: 2023-12-25
  Administered 2023-12-22 – 2023-12-25 (×9): 4 [IU] via SUBCUTANEOUS

## 2023-12-22 NOTE — Progress Notes (Signed)
PT Cancellation Note  Patient Details Name: Shane [name] MRN: [id number] DOB: [birth year]   Cancelled Treatment:    Reason Eval/Treat Not Completed: Patient declined, no reason specified  Unfortunately, providing multiple excuses and deflecting, refusing to work with PT today.  Spent a significant amount of time discussing the very real risks associated with immobility. Despite my best efforts, pt declines our services today. Encouraged to get OOB with staff when he is willing.   Kathlyn Sacramento, PT, DPT [facility]  Rehabilitation Services Physical Therapist Office: [phone] Website: [email or web address]  [name] 12/22/2023, 4:05 PM

## 2023-12-22 NOTE — Progress Notes (Signed)
Inpatient Rehab Admissions Coordinator:   Received a decision from [facility] Medicare and denial has been upheld..  We will not be able to pursue CIR admit.  I will update pt at bedside today.    Estill Dooms, PT, DPT Admissions Coordinator [phone] 12/22/23  9:45 AM

## 2023-12-22 NOTE — Progress Notes (Signed)
Occupational Therapy Treatment Patient Details Name: Shane [name] MRN: [id number] DOB: [birth year] Today's Date: 12/22/2023   History of present illness 83 year old male presented to ED 1/1 with shortness of breath.  Dx: Covid 19 PNA, Acute kidney injury, Acute hypoxic respiratory failure, Normocytic Anemia - secondary to RP bleed and critical illness, Acute HF with reduced EF, s/p CRRT. With significant past medical history including obstructive sleep apnea on CPAP at night, CAD s/p CABG x3 in 2021, aortic stenosis s/p TAVR 08/2023 at [facility], HFpEF, atrial fibrillation on Eliquis, plavix, sick sinus syndrome s/p leadless PPM, hypertension, hyperlipidemia, GERD, uncontrolled diabetes, CKD 3, pulmonary fibrosis   OT comments  Pt. Seen for skilled OT treatment. Wife present for session also.  Pt. Able to complete bed mobility with S with heavy reliance on bed functions and rails.  LB dressing with encouragement for attempts while seated EOB. Pt. Limited with c/o R flank pain but was able to don RLE prosthesis with min a and continued encouragement throughout.  Sit/stand x2 with min/mod A.  Cues for hand placement and technique.  Side step x2 with min a in prep for back to bed.  Cont. With acute OT POC.        If plan is discharge home, recommend the following:  A lot of help with walking and/or transfers;Two people to help with walking and/or transfers;A lot of help with bathing/dressing/bathroom;Two people to help with bathing/dressing/bathroom   Equipment Recommendations  None recommended by OT    Recommendations for Other Services Rehab consult    Precautions / Restrictions Precautions Precautions: Fall Precaution Comments: Lt disolateral foot wound. 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 Wears 2 L O2 at night Required Braces or Orthoses: Other Brace Other Brace: RLE prosthesis. Needs to continue to wear shrinker       Mobility Bed Mobility Overal bed mobility: Needs Assistance Bed Mobility:  Supine to Sit, Sit to Supine     Supine to sit: Supervision, HOB elevated, Used rails Sit to supine: Supervision   General bed mobility comments: supervision for safety, heavy use of bed features-exited and entered on L side reports same side at home. reports he has bed with bed functions at home including a "pole" attached to the floor beside bed that he can pull on same as he uses the rails here    Transfers Overall transfer level: Needs assistance Equipment used: Rolling walker (2 wheels) Transfers: Sit to/from Stand Sit to Stand: Min/Mod A, From elevated surface           General transfer comment: sit/stand x2 from eob, able to side step x2 with max encouragement.  pt. states RW is not high enough, encouraged more upright posture so i could help evaluate this for him. difficult to achieve as he was firm that it needed to be made taller.  increased height of RW one notch and will alert PT to also evaluate to achieve proper height of RW for pt. if needed     Balance                                           ADL either performed or assessed with clinical judgement   ADL Overall ADL's : Needs assistance/impaired                     Lower Body Dressing: Minimal assistance Lower Body Dressing  Details (indicate cue type and reason): to don/doff prosthetic from EOB             Functional mobility during ADLs: Minimal assistance General ADL Comments: encouragement for participation and task initiation and completion, initially describing how he completes tasks, then when asked to attempt would provide reasons and body language indication he could not.  with encouragement and increased time pt. was able to participate and complete tasks (LB dressing specifically)    Extremity/Trunk Assessment              Vision       Perception     Praxis      Cognition Arousal: Alert Behavior During Therapy: WFL for tasks assessed/performed Overall  Cognitive Status: Within Functional Limits for tasks assessed                                          Exercises      Shoulder Instructions       General Comments  Loves the beach. Has a beach house at [employer].  Is a retired Animator, and has a plumbing background also.      Pertinent Vitals/ Pain       Pain Assessment Pain Assessment: Faces Faces Pain Scale: Hurts little more Pain Location: R flank Pain Descriptors / Indicators: Aching Pain Intervention(s): Limited activity within patient's tolerance, Monitored during session, Repositioned, Patient requesting pain meds-RN notified  Home Living                                          Prior Functioning/Environment              Frequency  Min 1X/week        Progress Toward Goals  OT Goals(current goals can now be found in the care plan section)  Progress towards OT goals: Progressing toward goals     Plan      Co-evaluation                 AM-PAC OT "6 Clicks" Daily Activity     Outcome Measure   Help from another person eating meals?: None Help from another person taking care of personal grooming?: A Little Help from another person toileting, which includes using toliet, bedpan, or urinal?: A Lot Help from another person bathing (including washing, rinsing, drying)?: A Lot Help from another person to put on and taking off regular upper body clothing?: A Little Help from another person to put on and taking off regular lower body clothing?: A Lot 6 Click Score: 16    End of Session Equipment Utilized During Treatment: Gait belt;Rolling walker (2 wheels);Other (comment) (reports o2 is at night and if needed during day, tx completed without o2 on and pt. with no complaints or need for it)  OT Visit Diagnosis: Unsteadiness on feet (R26.81);Other abnormalities of gait and mobility (R26.89);Muscle weakness (generalized) (M62.81)   Activity Tolerance  Patient tolerated treatment well   Patient Left in bed;with call bell/phone within reach;with family/visitor present;Other (comment) (all 4 bed rails up as they were found upon arrival, MD in room mtg with pt. and spouse at end of session)   Nurse Communication Patient requests pain meds        Time: 1610-9604 OT Time Calculation (  min): 31 min  Charges: OT General Charges $OT Visit: 1 Visit OT Treatments $Self Care/Home Management : 23-37 mins  Boneta Lucks, COTA/L Acute Rehabilitation [phone]   Alessandra Bevels Lorraine-COTA/L 12/22/2023, 9:28 AM

## 2023-12-22 NOTE — Inpatient Diabetes Management (Signed)
Inpatient Diabetes Program Recommendations  AACE/ADA: New Consensus Statement on Inpatient Glycemic Control (2015)  Target Ranges:  Prepandial:   less than 140 mg/dL      Peak postprandial:   less than 180 mg/dL (1-2 hours)      Critically ill patients:  140 - 180 mg/dL   Lab Results  Component Value Date   GLUCAP 246 (H) 12/22/2023   HGBA1C 8.5 (H) 12/06/2023    Latest Reference Range & Units 12/21/23 07:50 12/21/23 11:37 12/21/23 15:28 12/21/23 20:48 12/22/23 08:19  Glucose-Capillary 70 - 99 mg/dL 161 (H) 096 (H) 045 (H) 157 (H) 246 (H)  (H): Data is abnormally high  Diabetes history: DM 2 Outpatient Diabetes medications: Farxiga 10 mg Daily, Humalog 10-15 units tid, Tresiba 20 units Daily Current orders for Inpatient glycemic control:  Semglee 28 units Daily Novolog 3 units tid meal coverage Novolog 0-20 units tid, 0-5 units hs correction   Nepro BID between meals   Inpatient Diabetes Program Recommendations:   Note: Glucose trends increase after meal intake -Increase Novolog meal coverage to 4 units tid if eating >50% of meals  Thank you, Darel Hong E. Kilian Schwartz, RN, MSN, CDCES  Diabetes Coordinator Inpatient Glycemic Control Team Team Pager [phone] (8am-5pm) 12/22/2023 11:06 AM

## 2023-12-22 NOTE — TOC Progression Note (Addendum)
Transition of Care [facility]) - Progression Note    Patient Details  Name: Shane [name] MRN: [id number] Date of Birth: Apr 29, 1941  Transition of [facility]) CM/SW Contact  Jeffrie Stander A [place], [place] Phone Number: 12/22/2023, 1:24 PM  Clinical Narrative:     Update 1730 Pt's wife declined other bed offers for now, waiting to hear at the beginning of the week regarding [facility] bed availability.   Update 1605   CSW was informed by [facility] that there is bed availability over the weekend at facility. CSW contacted pt's wife to discuss if they wanted to possibly select [facility]. There was no answer, CSW unable to leave VM. CSW informed weekend TOC to follow up with pt's wife regarding bed choice and start insurance authorization if placement is selected.     CSW spoke with pt's wife, Elease Hashimoto, she stated that she wanted [facility] for SNF. CSW informed her that [facility] at facility does not have any beds available currently and does not know when a bed would be available. CSW offered two other facilities, [facility] and [facility], and she stated that she would inquire about each facility and let CSW know about which place she would select.          Expected Discharge Plan and Services                                               Social Determinants of Health (SDOH) Interventions SDOH Screenings   Food Insecurity: No Food Insecurity (12/07/2023)  Housing: Low Risk  (12/07/2023)  Transportation Needs: No Transportation Needs (12/07/2023)  Utilities: Not At Risk (12/07/2023)  Depression (PHQ2-9): Low Risk  (01/11/2021)  Financial Resource Strain: Low Risk  (08/22/2023)   Received from [facility]  Social Connections: Moderately Isolated (12/07/2023)  Tobacco Use: Low Risk  (12/07/2023)    Readmission Risk Interventions    12/15/2023    2:29 PM  Readmission Risk Prevention Plan  Transportation Screening Complete  Medication Review (RN Care Manager) Complete  PCP or  Specialist appointment within 3-5 days of discharge Complete  HRI or Home Care Consult Complete  SW Recovery Care/Counseling Consult Complete  Palliative Care Screening Complete  Skilled Nursing Facility Not Applicable

## 2023-12-22 NOTE — Plan of Care (Signed)
  Problem: Education: Goal: Knowledge of risk factors and measures for prevention of condition will improve Outcome: Progressing   Problem: Coping: Goal: Psychosocial and spiritual needs will be supported Outcome: Progressing   Problem: Respiratory: Goal: Will maintain a patent airway Outcome: Progressing Goal: Complications related to the disease process, condition or treatment will be avoided or minimized Outcome: Progressing   Problem: Education: Goal: Ability to describe self-care measures that may prevent or decrease complications (Diabetes Survival Skills Education) will improve Outcome: Progressing Goal: Individualized Educational Video(s) Outcome: Progressing   Problem: Coping: Goal: Ability to adjust to condition or change in health will improve Outcome: Progressing   Problem: Fluid Volume: Goal: Ability to maintain a balanced intake and output will improve Outcome: Progressing   Problem: Health Behavior/Discharge Planning: Goal: Ability to identify and utilize available resources and services will improve Outcome: Progressing Goal: Ability to manage health-related needs will improve Outcome: Progressing   Problem: Metabolic: Goal: Ability to maintain appropriate glucose levels will improve Outcome: Progressing   Problem: Nutritional: Goal: Maintenance of adequate nutrition will improve Outcome: Progressing Goal: Progress toward achieving an optimal weight will improve Outcome: Progressing   Problem: Skin Integrity: Goal: Risk for impaired skin integrity will decrease Outcome: Progressing   Problem: Tissue Perfusion: Goal: Adequacy of tissue perfusion will improve Outcome: Progressing   Problem: Education: Goal: Knowledge of General Education information will improve Description: Including pain rating scale, medication(s)/side effects and non-pharmacologic comfort measures Outcome: Progressing   Problem: Health Behavior/Discharge Planning: Goal:  Ability to manage health-related needs will improve Outcome: Progressing   Problem: Clinical Measurements: Goal: Ability to maintain clinical measurements within normal limits will improve Outcome: Progressing Goal: Will remain free from infection Outcome: Progressing Goal: Diagnostic test results will improve Outcome: Progressing Goal: Respiratory complications will improve Outcome: Progressing Goal: Cardiovascular complication will be avoided Outcome: Progressing   Problem: Activity: Goal: Risk for activity intolerance will decrease Outcome: Progressing   Problem: Nutrition: Goal: Adequate nutrition will be maintained Outcome: Progressing   Problem: Coping: Goal: Level of anxiety will decrease Outcome: Progressing   Problem: Elimination: Goal: Will not experience complications related to bowel motility Outcome: Progressing Goal: Will not experience complications related to urinary retention Outcome: Progressing   Problem: Pain Management: Goal: General experience of comfort will improve Outcome: Progressing   Problem: Safety: Goal: Ability to remain free from injury will improve Outcome: Progressing   Problem: Skin Integrity: Goal: Risk for impaired skin integrity will decrease Outcome: Progressing

## 2023-12-22 NOTE — Progress Notes (Signed)
PROGRESS NOTE    Shane [name]  ZOX:[id number] DOB: November 27, 1941 DOA: 12/06/2023 PCP: [name], MD   Brief Narrative:  This 83 year old male with significant PMH including obstructive sleep apnea on CPAP at night, CAD s/p CABG x 3 in 2021, aortic stenosis s/p TAVR 08/2023 at [facility], HFpEF, atrial fibrillation on Eliquis, sick sinus syndrome s/p leadless PPM, hypertension, hyperlipidemia, GERD, uncontrolled diabetes, CKD 3A, pulmonary fibrosis followed by Dr. Marchelle Gearing, who presented to the ED on 12/06/2023 with shortness of breath.  He was found to be hypotensive and hypoxic on arrival in ED. He was started on nonrebreather and an epinephrine infusion prior to arrival. In the ED Labs +COVID, AKI on CKD with BUN/Cr 116, 4.69 with critical K of 7.1. bicarb 16. CBC without leukocytosis, Hgb 9.5. lactic 2.3>1.9, troponin 8,290, UA negative, blood cultures pending. CXR with likely pulmonary edema. With new AKI, hyperkalemia and EKG changes, Nephrology was consulted. Patient given lokelma, albuterol, Lasix, sodium bicarb, 5U insulin with D50, calcium gluconate. He was also given azithromycin/rocephin CAP coverage. Neprhology recommended albumin for BP support (initially on levo, now off), Lasix 80mg  IV, renal ultrasound.  Admitted initially to the ICU.  Significant events: 1/1: Admit to CCM for covid pneumonia, AKI with hyperK likely needing CRRT  Renal consultation -presented with a creatinine of 4.7 mg.  Michele Mcalpine July 2024 was 1.8 mg ] 1/2: CRRT initiated, Card Consult due to elev Troponin (Nstemi vs Demand Ischemia)  Cardiology consultation -ejection fraction 40% compared to baseline 55%. 1/3: CRRT discontinued.  PCCM signed off.   1/4; TRH  pickup, Heparin discontinued after 48 hours.  Cardiology recommended continuing Plavix but holding aspirin.  Cardiology recommending Toprol once blood pressure stabilized.    Assessment & Plan:   Principal Problem:   Acute kidney injury (nontraumatic)  (HCC) Active Problems:   COVID-19 virus infection   PAD (peripheral artery disease) (HCC)   Atrial fibrillation (HCC)   Hyperkalemia   Acute on chronic congestive heart failure (HCC)   Non-ST elevation (NSTEMI) myocardial infarction [facility])   Retroperitoneal bleeding   History of transcatheter aortic valve replacement (TAVR)   Chronic heart failure with preserved ejection fraction (HFpEF) (HCC)   Hx of CABG   Atherosclerosis of native coronary artery of native heart without angina pectoris   Acute on chronic hypoxic respiratory failure (HCC)   Anemia   Esophageal dysphagia   COVID-19   Type 2 diabetes mellitus without complication, with long-term current use of insulin (HCC)  Acute on chronic hypoxic respiratory failure, multifactorial: Likely due to COVID+, pulmonary edema due to acute systolic CHF, underlying pulmonary fibrosis. At baseline patient uses 2 L of supplemental oxygen via [place]. Patient presented with cough, shortness of breath x 3 days with no improvement on home nebulizers. Patient was found to be severely hypoxic requiring nonrebreather on arrival. Patient states his wife and grandson had been sick with COVID infection recently. SARS coronavirus 2 PCR positive, influenza A and B by PCR negative. Initial chest x-ray findings concerning for CHF exacerbation. Patient has completed treatment for COVID with remdesivir and Decadron. Patient has undergone brief CRRT during this hospitalization. Successfully now weaned down to room air. Patient with clinical improvement. HD tunneled cath removed per Nephro Continue Lasix 40 mg p.o. twice daily Patient has completed over 10 days of isolation.  Awaiting insurance auth for CIR, CIR declined.  Now pending SNF.   NSTEMI: Suspect secondary to demand ischemia in the setting of COVID-19 infection with underlying cardiomyopathy, Hx of  CAD. Patient denies any chest pain. Echo showed LVEF 40%,WMA, status post bioprosthetic aortic valve  /status post TAVR. Cardiology recommended medical management. He completed IV heparin for 48 hrs. Heparin discontinued due to retroperitoneal bleeding. Cardiology states holding aspirin given bleeding.  Acute retroperitoneal bleed: CTA/P showed retroperitoneal bleeding. Status post transfusion of 1 unit platelets, 2 units PRBCs, thiamine, DDAVP. Anticoagulation for thromboembolic prophylaxis given A-fib on hold due to risk of bleeding. Due to presentation with NSTEMI and cardiac history patient was transfused 2 units PRBCs on 12/12/2023.  Repeat CT A/P (12/12/2023 ), with redemonstrated large right retroperitoneal hematoma with hematocrit level.   Hematoma measures slightly larger compared to prior exam. -Hgb now stable.   Paroxysmal A-fib HR now controlled. Anticoagulation on hold secondary to retroperitoneal bleed as risk of bleeding outweighs benefits at this time and per cardiology recommendations. Outpatient follow-up with primary cardiologist for possible evaluation for Watchman versus retrial per cardiology.   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 Follow up outpatient.   Hyperlipidemia Continue Lipitor 80 mg daily.   CAD status post CABG/status post TAVR: Patient was to be started on aspirin 12/11/2023 per cardiology recommendations however due to drop in hemoglobin aspirin was also discontinued. Not on aspirin and Plavix.   Acute kidney injury:  Patient underwent CRRT 1/2-1/3. CRRT paused but restarted 12/09/2023 in the setting of retroperitoneal bleed and stopped on 12/11/2023. Renal function now improved . Continue to hold ACE inhibitor.  Hyperkalemia > Resolved. Likely secondary to  AKI Resolved.    Anion gap metabolic acidosis: > Resolved. Likely secondary to acute kidney injury. Improved with CRRT. Resolved.   Shock > Resolved. Not largely cardiogenic but cannot rule out a septic component.. Status post dose of antibiotic  coverage for community-acquired pneumonia and status post treatment for COVID infection. Shock resolved.   Normocytic anemia/Acute blood loss anemia: Likely secondary to retroperitoneal bleed and acute illness. retroperitoneal bleed. Not ideal, but ok to hold ASA as well as anticoagulation at this time given increased risk of life threatening bleeding. H&H is stable.   Dysphagia : Patient seen in consultation by GI, noted to have some intermittent dysphagia mostly to solids, dense bread and meat occasionally to liquids. GI and patient deferring any procedures at this point. F/U with GI as outpatient to further work up dysphagia.   Diabetes mellitus type 2 Hemoglobin A1c 8.5 (12/06/2023) Patient noted to be on Farxiga, Tresiba 20 units daily, prior to admission. Patient presenting with acute kidney injury, Farxiga on hold. Increase semglee to 28 units, continue SSI, Neurontin.    Agitation, anxiety: Continue ativan and trazadone.    DVT prophylaxis: Heparin Code Status: Full code Family Communication: Grand son at bedside Disposition Plan:   Status is: Inpatient Remains inpatient appropriate because: Now medically clear,  awaiting rehab authorization..   Consultants:  Nephrology General Surgery  IR Gastroenterology  Cardiology  Procedures: CRRT  Antimicrobials: Anti-infectives (From admission, onward)    Start     Dose/Rate Route Frequency Ordered Stop   12/07/23 1000  remdesivir 100 mg in sodium chloride 0.9 % 100 mL IVPB       Placed in "Followed by" Linked Group   100 mg 200 mL/hr over 30 Minutes Intravenous Daily 12/06/23 2239 12/08/23 0947   12/06/23 2245  remdesivir 200 mg in sodium chloride 0.9% 250 mL IVPB       Placed in "Followed by" Linked Group   200 mg 580 mL/hr over 30 Minutes Intravenous Once 12/06/23  2239 12/07/23 0142   12/06/23 1815  cefTRIAXone (ROCEPHIN) 2 g in sodium chloride 0.9 % 100 mL IVPB        2 g 200 mL/hr over 30 Minutes Intravenous  Once 12/06/23 1812 12/06/23 1908   12/06/23 1815  azithromycin (ZITHROMAX) 500 mg in sodium chloride 0.9 % 250 mL IVPB        500 mg 250 mL/hr over 60 Minutes Intravenous  Once 12/06/23 1812 12/06/23 2153      Subjective: Patient was seen and examined at bedside.  Overnight events noted.   Patient reports feeling much improved.  Reports having right flank pain due to hematoma. Patient reports he is awaiting rehab placement.  Objective: Vitals:   12/22/23 0540 12/22/23 0541 12/22/23 0818 12/22/23 1211  BP: 125/78  (!) 115/50 (!) 107/58  Pulse: 65  61 77  Resp: 18     Temp: 97.8 F (36.6 C)  98.3 F (36.8 C) 98.3 F (36.8 C)  TempSrc: Oral  Oral   SpO2: 100%  100% 100%  Weight:  105.7 kg    Height:        Intake/Output Summary (Last 24 hours) at 12/22/2023 1332 Last data filed at 12/22/2023 1045 Gross per 24 hour  Intake 240 ml  Output 2551 ml  Net -2311 ml   Filed Weights   12/20/23 0630 12/21/23 0408 12/22/23 0541  Weight: 104.1 kg 108.6 kg 105.7 kg    Examination:  General exam: Appears comfortable, deconditioned, not in any acute distress. Respiratory system: CTA bilaterally. Respiratory effort normal.  RR 14 Cardiovascular system: S1 & S2 heard, RRR. No JVD, murmurs, rubs, gallops or clicks.  Gastrointestinal system: Abdomen is non distended, soft and non tender.  Normal bowel sounds heard. Central nervous system: Alert and oriented x 3. No focal neurological deficits. 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 Skin: No rashes, lesions or ulcers Psychiatry: Judgement and insight appear normal. Mood & affect appropriate.     Data Reviewed: I have personally reviewed following labs and imaging studies  CBC: Recent Labs  Lab 12/17/23 1001 12/18/23 0536 12/20/23 0637 12/21/23 0635 12/22/23 0753  WBC 13.9* 13.9* 12.0* 10.1 8.7  HGB 11.0* 11.8* 11.7* 11.0* 10.5*  HCT 33.7* 35.7* 36.5* 34.8* 32.8*  MCV 85.8 85.6 88.4 89.2 88.4  PLT 166 169 182 162 149*    Basic Metabolic Panel: Recent Labs  Lab 12/16/23 0706 12/17/23 1001 12/18/23 0536 12/20/23 0637 12/21/23 0635 12/22/23 0753  NA 131* 132* 132* 135 131* 129*  K 4.3 4.1 4.1 4.6 4.9 4.5  CL 89* 90* 89* 94* 91* 92*  CO2 32 33* 32 34* 30 30  GLUCOSE 332* 252* 160* 105* 205* 270*  BUN 54* 43* 36* 41* 49* 45*  CREATININE 1.28* 1.15 1.06 1.20 1.53* 1.28*  CALCIUM 8.1* 8.3* 8.3* 8.6* 8.4* 8.4*  MG 1.4* 1.3* 1.7  --  1.6* 1.9  PHOS 3.2  --   --   --  3.8 2.8   GFR: Estimated Creatinine Clearance: 55.9 mL/min (A) (by C-G formula based on SCr of 1.28 mg/dL (H)). Liver Function Tests: Recent Labs  Lab 12/16/23 0706 12/17/23 1001 12/18/23 0536 12/20/23 0637  AST  --  26 25 26   ALT  --  23 21 22   ALKPHOS  --  93 97 97  BILITOT  --  1.8* 1.7* 1.6*  PROT  --  6.0* 6.0* 6.2*  ALBUMIN 2.6* 2.4* 2.5* 2.5*   No results for input(s): "LIPASE", "AMYLASE" in  the last 168 hours. No results for input(s): "AMMONIA" in the last 168 hours. Coagulation Profile: No results for input(s): "INR", "PROTIME" in the last 168 hours. Cardiac Enzymes: No results for input(s): "CKTOTAL", "CKMB", "CKMBINDEX", "TROPONINI" in the last 168 hours. BNP (last 3 results) No results for input(s): "PROBNP" in the last 8760 hours. HbA1C: No results for input(s): "HGBA1C" in the last 72 hours. CBG: Recent Labs  Lab 12/21/23 1137 12/21/23 1528 12/21/23 2048 12/22/23 0819 12/22/23 1214  GLUCAP 295* 190* 157* 246* 304*   Lipid Profile: No results for input(s): "CHOL", "HDL", "LDLCALC", "TRIG", "CHOLHDL", "LDLDIRECT" in the last 72 hours. Thyroid Function Tests: No results for input(s): "TSH", "T4TOTAL", "FREET4", "T3FREE", "THYROIDAB" in the last 72 hours. Anemia Panel: No results for input(s): "VITAMINB12", "FOLATE", "FERRITIN", "TIBC", "IRON", "RETICCTPCT" in the last 72 hours. Sepsis Labs: No results for input(s): "PROCALCITON", "LATICACIDVEN" in the last 168 hours.  No results found for this or any  previous visit (from the past 240 hours).   Radiology Studies: No results found.  Scheduled Meds:  atorvastatin  80 mg Oral Daily   Chlorhexidine Gluconate Cloth  6 each Topical Daily   vitamin B-12  1,000 mcg Oral Daily   docusate sodium  100 mg Oral BID   escitalopram  10 mg Oral Daily   feeding supplement (NEPRO CARB STEADY)  237 mL Oral BID BM   furosemide  40 mg Oral BID   gabapentin  300 mg Oral Daily   Gerhardt's butt cream   Topical BID   insulin aspart  0-20 Units Subcutaneous TID WC   insulin aspart  0-5 Units Subcutaneous QHS   insulin aspart  4 Units Subcutaneous TID WC   insulin glargine-yfgn  28 Units Subcutaneous Daily   multivitamin  1 tablet Oral QHS   pantoprazole  40 mg Oral Daily   polyethylene glycol  17 g Oral TID BM   traZODone  75 mg Oral QHS   zinc sulfate (50mg  elemental zinc)  220 mg Oral Daily   Continuous Infusions:     LOS: 16 days    Time spent: 35 mins    Willeen Niece, MD [facility]   If 7PM-7AM, please contact night-coverage

## 2023-12-23 DIAGNOSIS — I480 Paroxysmal atrial fibrillation: Secondary | ICD-10-CM | POA: Diagnosis not present

## 2023-12-23 DIAGNOSIS — N179 Acute kidney failure, unspecified: Secondary | ICD-10-CM | POA: Diagnosis not present

## 2023-12-23 DIAGNOSIS — D649 Anemia, unspecified: Secondary | ICD-10-CM | POA: Diagnosis not present

## 2023-12-23 DIAGNOSIS — E875 Hyperkalemia: Secondary | ICD-10-CM | POA: Diagnosis not present

## 2023-12-23 LAB — GLUCOSE, CAPILLARY
Glucose-Capillary: 210 mg/dL — ABNORMAL HIGH (ref 70–99)
Glucose-Capillary: 312 mg/dL — ABNORMAL HIGH (ref 70–99)
Glucose-Capillary: 281 mg/dL — ABNORMAL HIGH (ref 70–99)
Glucose-Capillary: 346 mg/dL — ABNORMAL HIGH (ref 70–99)

## 2023-12-23 LAB — BASIC METABOLIC PANEL
Anion gap: 9 (ref 5–15)
BUN: 47 mg/dL — ABNORMAL HIGH (ref 8–23)
CO2: 32 mmol/L (ref 22–32)
Calcium: 8.8 mg/dL — ABNORMAL LOW (ref 8.9–10.3)
Chloride: 91 mmol/L — ABNORMAL LOW (ref 98–111)
Creatinine, Ser: 1.58 mg/dL — ABNORMAL HIGH (ref 0.61–1.24)
GFR, Estimated: 43 mL/min — ABNORMAL LOW (ref >60)
Glucose, Bld: 202 mg/dL — ABNORMAL HIGH (ref 70–99)
Potassium: 4.8 mmol/L (ref 3.5–5.1)
Sodium: 132 mmol/L — ABNORMAL LOW (ref 135–145)

## 2023-12-23 LAB — CBC
HCT: 32.7 % — ABNORMAL LOW (ref 39.0–52.0)
Hemoglobin: 10.4 g/dL — ABNORMAL LOW (ref 13.0–17.0)
MCH: 28.3 pg (ref 26.0–34.0)
MCHC: 31.8 g/dL (ref 30.0–36.0)
MCV: 88.9 fL (ref 80.0–100.0)
Platelets: 156 10*3/uL (ref 150–400)
RBC: 3.68 MIL/uL — ABNORMAL LOW (ref 4.22–5.81)
RDW: 16.7 % — ABNORMAL HIGH (ref 11.5–15.5)
WBC: 8.8 10*3/uL (ref 4.0–10.5)
nRBC: 0 % (ref 0.0–0.2)

## 2023-12-23 MED ORDER — INSULIN GLARGINE-YFGN 100 UNIT/ML ~~LOC~~ SOLN
32.0000 [IU] | Freq: Every day | SUBCUTANEOUS | Status: DC
  Administered 2023-12-24: 32 [IU] via SUBCUTANEOUS
  Filled 2023-12-23: qty 0.32

## 2023-12-23 MED ORDER — METOPROLOL TARTRATE 25 MG PO TABS
25.0000 mg | ORAL_TABLET | Freq: Every day | ORAL | Status: DC
  Administered 2023-12-23 – 2023-12-27 (×5): 25 mg via ORAL
  Filled 2023-12-23 (×6): qty 1

## 2023-12-23 MED ORDER — DIPHENHYDRAMINE HCL 25 MG PO CAPS
25.0000 mg | ORAL_CAPSULE | Freq: Once | ORAL | Status: AC | PRN
  Administered 2023-12-23: 25 mg via ORAL
  Filled 2023-12-23: qty 1

## 2023-12-23 NOTE — Plan of Care (Signed)
Pt alert x4, 2L-CPAP, rfa-18g, R-BKA, v-paced

## 2023-12-23 NOTE — Progress Notes (Signed)
[facility]                                                                               Shane [name], is a 83 y.o. male, DOB - [birth year], HQI:[id number] Admit date - 12/06/2023    Outpatient Primary MD for the patient is [name], MD  LOS - 17  days    Brief summary   83 year old male with significant past medical history including obstructive sleep apnea on CPAP at night, CAD s/p CABG x3 in 2021, aortic stenosis s/p TAVR 08/2023 at [facility], HFpEF, atrial fibrillation on Eliquis, plavix, sick sinus syndrome s/p leadless PPM, hypertension, hyperlipidemia, GERD, uncontrolled diabetes, CKD 3, pulmonary fibrosis followed by Dr. Marchelle Gearing, who presented to the emergency department on 12/06/2023 with shortness of breath.  EMS was called and found to be hypotensive and hypoxic. He was started on nonrebreather and an epinephrine infusion prior to arrival. In the ED afebrile, SBP around 100, tachypneic. Labs +COVID, AKI on CKD with BUN/Cr 116, 4.69 with critical K of 7.1. bicarb 16. CBC without leukocytosis, hgb 9.5. lactic 2.3>1.9, troponin 8,290, UA negative, blood cultures pending. CXR with likely pulmonary edema. With new AKI, hyperkalemia and EKG changes, nephrology was consulted.   Significant events: 1/1: Admit to CCM for covid pneumonia, aki with hyperK likely needing CRRT  Renal consultation -presented with a creatinine of 4.  7 mg percent.  [Baseline July 2024 was 1.8 mg percent] 1/2: CRRT initiated, Card Consult due to elev Troponin (Nstemi vs Demand Ischemia)  Cardiology consultation -ejection fraction 40% compared to baseline 55%. 1/3: Continuing CRRT till this afternoon, PCCM sign off this afternoon, trx to [facility] tomorrow  -> heparin being continuedFor total 48 hours.  Cardiology recommended continuing Plavix but holding aspirin.  Cardiology recommending Toprol once blood pressure stabilized.         Assessment & Plan    Assessment and Plan:  Acute on chronic  hypoxic respiratory failure, multifactorial: Likely due to COVID+, pulmonary edema due to acute systolic CHF, underlying pulmonary fibrosis. At baseline patient uses 2 L of supplemental oxygen via [place]. Patient presented with cough, shortness of breath x 3 days with no improvement on home nebulizers. Patient was found to be severely hypoxic requiring nonrebreather on arrival. Patient states his wife and grandson had been sick with COVID infection recently. SARS coronavirus 2 PCR positive, influenza A and B by PCR negative. Initial chest x-ray findings concerning for CHF exacerbation. Patient has completed treatment for COVID with remdesivir and Decadron. Patient has undergone brief CRRT during this hospitalization. Successfully now weaned down to room air. Patient with clinical improvement. HD tunneled cath removed per Nephro Continue Lasix 40 mg p.o. twice daily Patient has completed over 10 days of isolation.  Awaiting insurance auth for CIR, CIR declined.  Now pending SNF.   NSTEMI  Secondary to demand ischemia in the setting of COVID-19 infection with underlying cardiomyopathy. Patient denies any chest pain. Echo showed LVEF 40%,WMA, status post bioprosthetic aortic valve /status post TAVR. Cardiology recommended medical management. Heparin discontinued due to retroperitoneal bleeding.   Acute retroperitoneal bleed CTA/P showed retroperitoneal bleeding. Status post transfusion of 1 unit platelets, 2 units  PRBCs, thiamine, DDAVP. Anticoagulation for thromboembolic prophylaxis given A-fib on hold due to risk of bleeding. Due to presentation with NSTEMI and cardiac history patient was transfused 2 units PRBCs on 12/12/2023.  Repeat CT A/P (12/12/2023 ), with redemonstrated large right retroperitoneal hematoma with hematocrit level. Hemoglobin stable.    PAF Rate controlled.  Anticoagulation on hold sec to retroperitoneal bleed.  Recommend outpatient follow up with cardiology to see  if he is a candidate for restarting anti coagulation.   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 Follow up outpatient.   Hyperlipidemia Continue Lipitor 80 mg daily.   CAD s/p CABG s/p TAVR Aspirin, and heparin discontinued.    AKI.  CRRT on 1/2 to 1/3.  Creatinine is at 1.5 today.     Acute blood loss anemia/ Normocytic anemia/ Anemia of chronic disease Hemoglobin stable around 10.    Hyponatremia  Improved.   Hyperkalemia Resolved.  AG metabolic acidosis Resolved.     Shock resolved.  Not largely cardiogenic but cannot rule out a septic component.. Status post dose of antibiotic coverage for community-acquired pneumonia and status post treatment for COVID infection. Shock resolved.     Dysphagia Recommend outpatient follow up with GI    Type 2 DM uncontrolled with hyperglycemia.  CBG (last 3)  Recent Labs    12/22/23 2049 12/23/23 0756 12/23/23 1126  GLUCAP 239* 210* 312*   Increase the Semglee to 32 units , continue with novolog 4 units [facility] AND SSI.   RN Pressure Injury Documentation:    Malnutrition Type:  Nutrition Problem: Inadequate oral intake Etiology: chronic illness (CAD, AS, CKD, DM)   Malnutrition Characteristics:  Signs/Symptoms: per patient/family report   Nutrition Interventions:  Interventions: MVI, Nepro shake, Refer to RD note for recommendations  Estimated body mass index is 31.75 kg/m as calculated from the following:   Height as of this encounter: 6' (1.829 m).   Weight as of this encounter: 106.2 kg.  Code Status: full code.  DVT Prophylaxis:  SCDs Start: 12/06/23 2212   Level of Care: Level of care: Telemetry Medical Family Communication: none at bedside.  Disposition Plan:     Remains inpatient appropriate:  SNF Placement.   Procedures:  CRRT  Consultants:    Nephrology General Surgery  IR Gastroenterology  Antimicrobials:   Anti-infectives (From  admission, onward)    Start     Dose/Rate Route Frequency Ordered Stop   12/07/23 1000  remdesivir 100 mg in sodium chloride 0.9 % 100 mL IVPB       Placed in "Followed by" Linked Group   100 mg 200 mL/hr over 30 Minutes Intravenous Daily 12/06/23 2239 12/08/23 0947   12/06/23 2245  remdesivir 200 mg in sodium chloride 0.9% 250 mL IVPB       Placed in "Followed by" Linked Group   200 mg 580 mL/hr over 30 Minutes Intravenous Once 12/06/23 2239 12/07/23 0142   12/06/23 1815  cefTRIAXone (ROCEPHIN) 2 g in sodium chloride 0.9 % 100 mL IVPB        2 g 200 mL/hr over 30 Minutes Intravenous Once 12/06/23 1812 12/06/23 1908   12/06/23 1815  azithromycin (ZITHROMAX) 500 mg in sodium chloride 0.9 % 250 mL IVPB        500 mg 250 mL/hr over 60 Minutes Intravenous  Once 12/06/23 1812 12/06/23 2153        Medications  Scheduled Meds:  atorvastatin  80 mg Oral Daily   Chlorhexidine Gluconate Cloth  6 each Topical Daily   vitamin B-12  1,000 mcg Oral Daily   docusate sodium  100 mg Oral BID   escitalopram  10 mg Oral Daily   feeding supplement (NEPRO CARB STEADY)  237 mL Oral BID BM   furosemide  40 mg Oral BID   gabapentin  300 mg Oral Daily   Gerhardt's butt cream   Topical BID   insulin aspart  0-20 Units Subcutaneous TID WC   insulin aspart  0-5 Units Subcutaneous QHS   insulin aspart  4 Units Subcutaneous TID WC   insulin glargine-yfgn  28 Units Subcutaneous Daily   metoprolol tartrate  25 mg Oral Daily   multivitamin  1 tablet Oral QHS   pantoprazole  40 mg Oral Daily   polyethylene glycol  17 g Oral TID BM   traZODone  75 mg Oral QHS   zinc sulfate (50mg  elemental zinc)  220 mg Oral Daily   Continuous Infusions: PRN Meds:.acetaminophen, diphenhydrAMINE, heparin sodium (porcine), HYDROcodone bit-homatropine, HYDROmorphone (DILAUDID) injection, ipratropium-albuterol, LORazepam, LORazepam, ondansetron (ZOFRAN) IV, mouth rinse, oxyCODONE, phenol    Subjective:   Shane [name] was  seen and examined today.  No new complaints.   Objective:   Vitals:   12/23/23 0028 12/23/23 0515 12/23/23 0631 12/23/23 0751  BP: (!) 124/57 (!) 80/54 (!) 92/54 (!) 92/50  Pulse: 65 (!) 59 62 (!) 58  Resp: 16 18 16 18   Temp: 97.6 F (36.4 C) 97.6 F (36.4 C)  98 F (36.7 C)  TempSrc: Oral Oral    SpO2: 98% 100% 100% 91%  Weight:  106.2 kg    Height:        Intake/Output Summary (Last 24 hours) at 12/23/2023 1417 Last data filed at 12/23/2023 0600 Gross per 24 hour  Intake 300 ml  Output 2200 ml  Net -1900 ml   Filed Weights   12/21/23 0408 12/22/23 0541 12/23/23 0515  Weight: 108.6 kg 105.7 kg 106.2 kg     Exam General exam: Appears calm and comfortable  Respiratory system: Clear to auscultation. Respiratory effort normal. Cardiovascular system: S1 & S2 heard, RRR.  Gastrointestinal system: Abdomen is nondistended, soft and nontender.  Central nervous system: Alert and oriented.  Extremities: Symmetric 5 x 5 power. Skin: No rashes, lesions or ulcers Psychiatry:  Mood & affect appropriate.     Data Reviewed:  I have personally reviewed following labs and imaging studies   CBC Lab Results  Component Value Date   WBC 8.8 12/23/2023   RBC 3.68 (L) 12/23/2023   HGB 10.4 (L) 12/23/2023   HCT 32.7 (L) 12/23/2023   MCV 88.9 12/23/2023   MCH 28.3 12/23/2023   PLT 156 12/23/2023   MCHC 31.8 12/23/2023   RDW 16.7 (H) 12/23/2023   LYMPHSABS 0.5 (L) 12/06/2023   MONOABS 1.6 (H) 12/06/2023   EOSABS 0.0 12/06/2023   BASOSABS 0.0 12/06/2023     Last metabolic panel Lab Results  Component Value Date   NA 132 (L) 12/23/2023   K 4.8 12/23/2023   CL 91 (L) 12/23/2023   CO2 32 12/23/2023   BUN 47 (H) 12/23/2023   CREATININE 1.58 (H) 12/23/2023   GLUCOSE 202 (H) 12/23/2023   GFRNONAA 43 (L) 12/23/2023   GFRAA 53 (L) 07/17/2020   CALCIUM 8.8 (L) 12/23/2023   PHOS 2.8 12/22/2023   PROT 6.2 (L) 12/20/2023   ALBUMIN 2.5 (L) 12/20/2023   BILITOT 1.6 (H)  12/20/2023   ALKPHOS 97 12/20/2023   AST 26 12/20/2023  ALT 22 12/20/2023   ANIONGAP 9 12/23/2023    CBG (last 3)  Recent Labs    12/22/23 2049 12/23/23 0756 12/23/23 1126  GLUCAP 239* 210* 312*      Coagulation Profile: No results for input(s): "INR", "PROTIME" in the last 168 hours.   Radiology Studies: No results found.     Kathlen Mody M.D. [facility] 12/23/2023, 2:17 PM  Available via Epic secure chat 7am-7pm After 7 pm, please refer to night coverage provider listed on amion.

## 2023-12-23 NOTE — Progress Notes (Signed)
Patient has a pacemaker due to history of CHB/SSS and notified by RN that he had a 13 beat run of V. tach on telemetry earlier tonight but remained asymptomatic.  Vital signs currently stable.  Per review of chart, he is on metoprolol tartrate 25 mg daily at home but it has been held since admission.  Potassium and magnesium were normal on labs yesterday.  Discussed with cardiology and his home metoprolol has been resumed.

## 2023-12-23 NOTE — Progress Notes (Signed)
   12/23/23 0012  BiPAP/CPAP/SIPAP  $ Non-Invasive Home Ventilator  Subsequent  BiPAP/CPAP/SIPAP Pt Type Adult  BiPAP/CPAP/SIPAP Resmed  Mask Type Full face mask  FiO2 (%) 21 %  Patient Home Equipment Yes  Auto Titrate No  BiPAP/CPAP /SiPAP Vitals  SpO2 98 %

## 2023-12-23 NOTE — Plan of Care (Signed)
  Problem: Education: Goal: Knowledge of risk factors and measures for prevention of condition will improve Outcome: Progressing   Problem: Coping: Goal: Psychosocial and spiritual needs will be supported Outcome: Progressing   Problem: Respiratory: Goal: Will maintain a patent airway Outcome: Progressing Goal: Complications related to the disease process, condition or treatment will be avoided or minimized Outcome: Progressing   Problem: Education: Goal: Ability to describe self-care measures that may prevent or decrease complications (Diabetes Survival Skills Education) will improve Outcome: Progressing Goal: Individualized Educational Video(s) Outcome: Progressing   Problem: Coping: Goal: Ability to adjust to condition or change in health will improve Outcome: Progressing   Problem: Fluid Volume: Goal: Ability to maintain a balanced intake and output will improve Outcome: Progressing   Problem: Health Behavior/Discharge Planning: Goal: Ability to identify and utilize available resources and services will improve Outcome: Progressing Goal: Ability to manage health-related needs will improve Outcome: Progressing   Problem: Metabolic: Goal: Ability to maintain appropriate glucose levels will improve Outcome: Progressing   Problem: Nutritional: Goal: Maintenance of adequate nutrition will improve Outcome: Progressing Goal: Progress toward achieving an optimal weight will improve Outcome: Progressing   Problem: Skin Integrity: Goal: Risk for impaired skin integrity will decrease Outcome: Progressing   Problem: Tissue Perfusion: Goal: Adequacy of tissue perfusion will improve Outcome: Progressing   Problem: Education: Goal: Knowledge of General Education information will improve Description: Including pain rating scale, medication(s)/side effects and non-pharmacologic comfort measures Outcome: Progressing   Problem: Health Behavior/Discharge Planning: Goal:  Ability to manage health-related needs will improve Outcome: Progressing   Problem: Clinical Measurements: Goal: Ability to maintain clinical measurements within normal limits will improve Outcome: Progressing Goal: Will remain free from infection Outcome: Progressing Goal: Diagnostic test results will improve Outcome: Progressing Goal: Respiratory complications will improve Outcome: Progressing Goal: Cardiovascular complication will be avoided Outcome: Progressing   Problem: Activity: Goal: Risk for activity intolerance will decrease Outcome: Progressing   Problem: Nutrition: Goal: Adequate nutrition will be maintained Outcome: Progressing   Problem: Coping: Goal: Level of anxiety will decrease Outcome: Progressing   Problem: Elimination: Goal: Will not experience complications related to bowel motility Outcome: Progressing Goal: Will not experience complications related to urinary retention Outcome: Progressing   Problem: Pain Management: Goal: General experience of comfort will improve Outcome: Progressing   Problem: Safety: Goal: Ability to remain free from injury will improve Outcome: Progressing   Problem: Skin Integrity: Goal: Risk for impaired skin integrity will decrease Outcome: Progressing

## 2023-12-24 ENCOUNTER — Inpatient Hospital Stay (HOSPITAL_COMMUNITY): Payer: Medicare Other

## 2023-12-24 DIAGNOSIS — E875 Hyperkalemia: Secondary | ICD-10-CM | POA: Diagnosis not present

## 2023-12-24 DIAGNOSIS — I480 Paroxysmal atrial fibrillation: Secondary | ICD-10-CM | POA: Diagnosis not present

## 2023-12-24 DIAGNOSIS — N179 Acute kidney failure, unspecified: Secondary | ICD-10-CM | POA: Diagnosis not present

## 2023-12-24 DIAGNOSIS — D649 Anemia, unspecified: Secondary | ICD-10-CM | POA: Diagnosis not present

## 2023-12-24 LAB — BASIC METABOLIC PANEL
Anion gap: 9 (ref 5–15)
BUN: 47 mg/dL — ABNORMAL HIGH (ref 8–23)
CO2: 30 mmol/L (ref 22–32)
Calcium: 8.7 mg/dL — ABNORMAL LOW (ref 8.9–10.3)
Chloride: 93 mmol/L — ABNORMAL LOW (ref 98–111)
Creatinine, Ser: 1.38 mg/dL — ABNORMAL HIGH (ref 0.61–1.24)
GFR, Estimated: 51 mL/min — ABNORMAL LOW (ref >60)
Glucose, Bld: 307 mg/dL — ABNORMAL HIGH (ref 70–99)
Potassium: 5 mmol/L (ref 3.5–5.1)
Sodium: 132 mmol/L — ABNORMAL LOW (ref 135–145)

## 2023-12-24 LAB — GLUCOSE, CAPILLARY
Glucose-Capillary: 273 mg/dL — ABNORMAL HIGH (ref 70–99)
Glucose-Capillary: 298 mg/dL — ABNORMAL HIGH (ref 70–99)
Glucose-Capillary: 275 mg/dL — ABNORMAL HIGH (ref 70–99)
Glucose-Capillary: 172 mg/dL — ABNORMAL HIGH (ref 70–99)

## 2023-12-24 LAB — HEMOGLOBIN AND HEMATOCRIT, BLOOD
HCT: 33 % — ABNORMAL LOW (ref 39.0–52.0)
Hemoglobin: 10.5 g/dL — ABNORMAL LOW (ref 13.0–17.0)

## 2023-12-24 MED ORDER — INSULIN GLARGINE-YFGN 100 UNIT/ML ~~LOC~~ SOLN
36.0000 [IU] | Freq: Every day | SUBCUTANEOUS | Status: DC
  Administered 2023-12-25 – 2023-12-28 (×4): 36 [IU] via SUBCUTANEOUS
  Filled 2023-12-24 (×4): qty 0.36

## 2023-12-24 MED ORDER — OXYCODONE HCL 5 MG PO TABS
5.0000 mg | ORAL_TABLET | ORAL | Status: DC | PRN
  Administered 2023-12-28 – 2023-12-29 (×2): 7.5 mg via ORAL
  Filled 2023-12-24 (×2): qty 2

## 2023-12-24 NOTE — Plan of Care (Addendum)
Wife said that wound to left foot is getting worsened, notified MD Vijaya, ordered WOC consult ; dressing changed to left foot wound ; observed that abdomen is distended and firm, notified MD, ordered for CTAP and repeat h&h ; dilaudid given for flank pain, patient is satisfied with pain management ; will continue to monitor  Problem: Education: Goal: Knowledge of risk factors and measures for prevention of condition will improve Outcome: Progressing   Problem: Coping: Goal: Psychosocial and spiritual needs will be supported Outcome: Progressing   Problem: Respiratory: Goal: Will maintain a patent airway Outcome: Progressing Goal: Complications related to the disease process, condition or treatment will be avoided or minimized Outcome: Progressing   Problem: Education: Goal: Ability to describe self-care measures that may prevent or decrease complications (Diabetes Survival Skills Education) will improve Outcome: Progressing Goal: Individualized Educational Video(s) Outcome: Progressing   Problem: Coping: Goal: Ability to adjust to condition or change in health will improve Outcome: Progressing   Problem: Fluid Volume: Goal: Ability to maintain a balanced intake and output will improve Outcome: Progressing   Problem: Health Behavior/Discharge Planning: Goal: Ability to identify and utilize available resources and services will improve Outcome: Progressing Goal: Ability to manage health-related needs will improve Outcome: Progressing   Problem: Metabolic: Goal: Ability to maintain appropriate glucose levels will improve Outcome: Progressing   Problem: Nutritional: Goal: Maintenance of adequate nutrition will improve Outcome: Progressing Goal: Progress toward achieving an optimal weight will improve Outcome: Progressing   Problem: Skin Integrity: Goal: Risk for impaired skin integrity will decrease Outcome: Progressing   Problem: Tissue Perfusion: Goal: Adequacy of  tissue perfusion will improve Outcome: Progressing   Problem: Education: Goal: Knowledge of General Education information will improve Description: Including pain rating scale, medication(s)/side effects and non-pharmacologic comfort measures Outcome: Progressing   Problem: Health Behavior/Discharge Planning: Goal: Ability to manage health-related needs will improve Outcome: Progressing   Problem: Clinical Measurements: Goal: Ability to maintain clinical measurements within normal limits will improve Outcome: Progressing Goal: Will remain free from infection Outcome: Progressing Goal: Diagnostic test results will improve Outcome: Progressing Goal: Respiratory complications will improve Outcome: Progressing Goal: Cardiovascular complication will be avoided Outcome: Progressing   Problem: Activity: Goal: Risk for activity intolerance will decrease Outcome: Progressing   Problem: Nutrition: Goal: Adequate nutrition will be maintained Outcome: Progressing   Problem: Coping: Goal: Level of anxiety will decrease Outcome: Progressing   Problem: Elimination: Goal: Will not experience complications related to bowel motility Outcome: Progressing Goal: Will not experience complications related to urinary retention Outcome: Progressing   Problem: Pain Management: Goal: General experience of comfort will improve Outcome: Progressing   Problem: Safety: Goal: Ability to remain free from injury will improve Outcome: Progressing   Problem: Skin Integrity: Goal: Risk for impaired skin integrity will decrease Outcome: Progressing

## 2023-12-24 NOTE — Progress Notes (Signed)
[facility]                                                                               Shane [name], is a 83 y.o. male, DOB - 1941-05-30, XBM:[id number] Admit date - 12/06/2023    Outpatient Primary MD for the patient is [name], MD  LOS - 18  days    Brief summary   83 year old male with significant past medical history including obstructive sleep apnea on CPAP at night, CAD s/p CABG x3 in 2021, aortic stenosis s/p TAVR 08/2023 at [facility], HFpEF, atrial fibrillation on Eliquis, plavix, sick sinus syndrome s/p leadless PPM, hypertension, hyperlipidemia, GERD, uncontrolled diabetes, CKD 3, pulmonary fibrosis followed by Dr. Marchelle Gearing, who presented to the emergency department on 12/06/2023 with shortness of breath.  EMS was called and found to be hypotensive and hypoxic. He was started on nonrebreather and an epinephrine infusion prior to arrival. In the ED afebrile, SBP around 100, tachypneic. Labs +COVID, AKI on CKD with BUN/Cr 116, 4.69 with critical K of 7.1. bicarb 16. CBC without leukocytosis, hgb 9.5. lactic 2.3>1.9, troponin 8,290, UA negative, blood cultures pending. CXR with likely pulmonary edema. With new AKI, hyperkalemia and EKG changes, nephrology was consulted.  He was admitted by CCM for covid pneumonia and AKI, hyperkalemia subsequently underwent CRRT. Subsequently his renal parameters improved. Cardiology also consulted   for NSTEMI, recommended medical management.     Assessment & Plan    Assessment and Plan:  Acute on chronic hypoxic respiratory failure, multifactorial: Likely due to COVID+, pulmonary edema due to acute systolic CHF, underlying pulmonary fibrosis. At baseline patient uses 2 L of supplemental oxygen via [place]. Patient presented with cough, shortness of breath x 3 days with no improvement on home nebulizers. Patient was found to be severely hypoxic requiring nonrebreather on arrival. Patient states his wife and grandson had been sick with  COVID infection recently. SARS coronavirus 2 PCR positive, influenza A and B by PCR negative. Initial chest x-ray findings concerning for CHF exacerbation. Patient has completed treatment for COVID with remdesivir and Decadron. Patient has undergone brief CRRT during this hospitalization. Patient with clinical improvement. HD tunneled cath removed per Nephro Continue Lasix 40 mg p.o. twice daily Patient has completed over 10 days of isolation.  Therapy eval recommending SNF as CIR was denied.    NSTEMI  Secondary to demand ischemia in the setting of COVID-19 infection with underlying cardiomyopathy. Patient denies any chest pain. Echo showed LVEF 40%,WMA, status post bioprosthetic aortic valve /status post TAVR. Cardiology recommended medical management. Heparin discontinued due to retroperitoneal bleeding.   Acute retroperitoneal bleed CTA/P showed retroperitoneal bleeding. Status post transfusion of 1 unit platelets, 2 units PRBCs, thiamine, DDAVP. Anticoagulation for thromboembolic prophylaxis given A-fib on hold due to risk of bleeding. Due to presentation with NSTEMI and cardiac history patient was transfused 2 units PRBCs on 12/12/2023.  Repeat CT A/P (12/12/2023 ), with redemonstrated large right retroperitoneal hematoma with hematocrit level.  Patient reports worsening pain on coughing or deep breathing on the RUQ , concerned for worsening retroperitoneal bleed. Check H&H later today. Repeat CT abd and pelvis without contrast ordered.     PAF  Rate controlled.  Anticoagulation on hold sec to retroperitoneal bleed.  Recommend outpatient follow up with cardiology to see if he is a candidate for restarting anti coagulation.   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 Follow up outpatient.   Hyperlipidemia Continue Lipitor 80 mg daily.   CAD s/p CABG s/p TAVR Aspirin, and heparin discontinued.    AKI.  CRRT on 1/2 to 1/3.   Creatinine is at 1.5 today.   Left chronic foot wound Wound On the plantar aspect. Wound care on board.  Wife at bedside .   Acute blood loss anemia/ Normocytic anemia/ Anemia of chronic disease Hemoglobin stable around 10.    Hyponatremia  Improved.   Hyperkalemia Resolved.  AG metabolic acidosis Resolved.     Shock resolved.  Not largely cardiogenic but cannot rule out a septic component.. Status post dose of antibiotic coverage for community-acquired pneumonia and status post treatment for COVID infection. Shock resolved.     Dysphagia Recommend outpatient follow up with GI    Type 2 DM uncontrolled with hyperglycemia.  CBG (last 3)  Recent Labs    12/24/23 0729 12/24/23 1144 12/24/23 1617  GLUCAP 273* 298* 275*   Increase the Semglee to 36 units , continue with novolog 4 units TIDAC AND SSI.   RN Pressure Injury Documentation:    Malnutrition Type:  Nutrition Problem: Inadequate oral intake Etiology: chronic illness (CAD, AS, CKD, DM)   Malnutrition Characteristics:  Signs/Symptoms: per patient/family report   Nutrition Interventions:  Interventions: MVI, Nepro shake, Refer to RD note for recommendations  Estimated body mass index is 31.81 kg/m as calculated from the following:   Height as of this encounter: 6' (1.829 m).   Weight as of this encounter: 106.4 kg.  Code Status: full code.  DVT Prophylaxis:  SCDs Start: 12/06/23 2212   Level of Care: Level of care: Telemetry Medical Family Communication: none at bedside.  Disposition Plan:     Remains inpatient appropriate:  SNF Placement. Pending CT abd and pelvis.   Procedures:  CRRT  Consultants:    Nephrology General Surgery  IR Gastroenterology  Antimicrobials:   Anti-infectives (From admission, onward)    Start     Dose/Rate Route Frequency Ordered Stop   12/07/23 1000  remdesivir 100 mg in sodium chloride 0.9 % 100 mL IVPB       Placed in "Followed by" Linked Group    100 mg 200 mL/hr over 30 Minutes Intravenous Daily 12/06/23 2239 12/08/23 0947   12/06/23 2245  remdesivir 200 mg in sodium chloride 0.9% 250 mL IVPB       Placed in "Followed by" Linked Group   200 mg 580 mL/hr over 30 Minutes Intravenous Once 12/06/23 2239 12/07/23 0142   12/06/23 1815  cefTRIAXone (ROCEPHIN) 2 g in sodium chloride 0.9 % 100 mL IVPB        2 g 200 mL/hr over 30 Minutes Intravenous Once 12/06/23 1812 12/06/23 1908   12/06/23 1815  azithromycin (ZITHROMAX) 500 mg in sodium chloride 0.9 % 250 mL IVPB        500 mg 250 mL/hr over 60 Minutes Intravenous  Once 12/06/23 1812 12/06/23 2153        Medications  Scheduled Meds:  atorvastatin  80 mg Oral Daily   Chlorhexidine Gluconate Cloth  6 each Topical Daily   vitamin B-12  1,000 mcg Oral Daily   docusate sodium  100 mg Oral BID   escitalopram  10 mg Oral  Daily   feeding supplement (NEPRO CARB STEADY)  237 mL Oral BID BM   furosemide  40 mg Oral BID   gabapentin  300 mg Oral Daily   Gerhardt's butt cream   Topical BID   insulin aspart  0-20 Units Subcutaneous TID WC   insulin aspart  0-5 Units Subcutaneous QHS   insulin aspart  4 Units Subcutaneous TID WC   insulin glargine-yfgn  32 Units Subcutaneous Daily   metoprolol tartrate  25 mg Oral Daily   multivitamin  1 tablet Oral QHS   pantoprazole  40 mg Oral Daily   polyethylene glycol  17 g Oral TID BM   traZODone  75 mg Oral QHS   zinc sulfate (50mg  elemental zinc)  220 mg Oral Daily   Continuous Infusions: PRN Meds:.acetaminophen, heparin sodium (porcine), HYDROcodone bit-homatropine, HYDROmorphone (DILAUDID) injection, ipratropium-albuterol, LORazepam, LORazepam, ondansetron (ZOFRAN) IV, mouth rinse, oxyCODONE, phenol    Subjective:   Shane [name] was seen and examined today.   Right upper quadrant pain.  No nausea or vomiting.   Objective:   Vitals:   12/24/23 0006 12/24/23 0400 12/24/23 0729 12/24/23 1618  BP: (!) 100/47 (!) 150/62 115/61 112/61   Pulse: 65 72 74 62  Resp: 16 17 18 18   Temp: 98 F (36.7 C) 97.8 F (36.6 C) 98 F (36.7 C) 98 F (36.7 C)  TempSrc:      SpO2: 98% 100% 97% 100%  Weight:  106.4 kg    Height:        Intake/Output Summary (Last 24 hours) at 12/24/2023 1711 Last data filed at 12/24/2023 1321 Gross per 24 hour  Intake 480 ml  Output 1801 ml  Net -1321 ml   Filed Weights   12/22/23 0541 12/23/23 0515 12/24/23 0400  Weight: 105.7 kg 106.2 kg 106.4 kg     Exam General exam: Appears calm and comfortable  Respiratory system:  air entry fair. On 4 lit of [place] oxygen.  Cardiovascular system: S1 & S2 heard, RRR Gastrointestinal system: Abdomen is soft tender int he RUQ and right flank .  Central nervous system: Alert and oriented.  Extremities:  left foot wound. Chronic and bandaged.  Skin: No rashes,  Psychiatry:  Mood & affect appropriate.      Data Reviewed:  I have personally reviewed following labs and imaging studies   CBC Lab Results  Component Value Date   WBC 8.8 12/23/2023   RBC 3.68 (L) 12/23/2023   HGB 10.4 (L) 12/23/2023   HCT 32.7 (L) 12/23/2023   MCV 88.9 12/23/2023   MCH 28.3 12/23/2023   PLT 156 12/23/2023   MCHC 31.8 12/23/2023   RDW 16.7 (H) 12/23/2023   LYMPHSABS 0.5 (L) 12/06/2023   MONOABS 1.6 (H) 12/06/2023   EOSABS 0.0 12/06/2023   BASOSABS 0.0 12/06/2023     Last metabolic panel Lab Results  Component Value Date   NA 132 (L) 12/24/2023   K 5.0 12/24/2023   CL 93 (L) 12/24/2023   CO2 30 12/24/2023   BUN 47 (H) 12/24/2023   CREATININE 1.38 (H) 12/24/2023   GLUCOSE 307 (H) 12/24/2023   GFRNONAA 51 (L) 12/24/2023   GFRAA 53 (L) 07/17/2020   CALCIUM 8.7 (L) 12/24/2023   PHOS 2.8 12/22/2023   PROT 6.2 (L) 12/20/2023   ALBUMIN 2.5 (L) 12/20/2023   BILITOT 1.6 (H) 12/20/2023   ALKPHOS 97 12/20/2023   AST 26 12/20/2023   ALT 22 12/20/2023   ANIONGAP 9 12/24/2023    CBG (  last 3)  Recent Labs    12/24/23 0729 12/24/23 1144 12/24/23 1617   GLUCAP 273* 298* 275*      Coagulation Profile: No results for input(s): "INR", "PROTIME" in the last 168 hours.   Radiology Studies: No results found.     Kathlen Mody M.D. [facility] 12/24/2023, 5:11 PM  Available via Epic secure chat 7am-7pm After 7 pm, please refer to night coverage provider listed on amion.

## 2023-12-24 NOTE — Progress Notes (Signed)
   12/23/23 2200  BiPAP/CPAP/SIPAP  $ Non-Invasive Home Ventilator  Subsequent  BiPAP/CPAP/SIPAP Pt Type Adult  BiPAP/CPAP/SIPAP Resmed  Mask Type Full face mask  Flow Rate 3 lpm  Patient Home Equipment Yes  Auto Titrate No  Safety Check Completed by RT for Home Unit Yes, no issues noted  BiPAP/CPAP /SiPAP Vitals  SpO2 98 %

## 2023-12-24 NOTE — Progress Notes (Signed)
Mobility Specialist: Progress Note   12/24/23 1153  Mobility  Activity Ambulated with assistance in room;Stood at bedside  Level of Assistance Moderate assist, patient does 50-74%  Assistive Device Front wheel walker  Distance Ambulated (ft) 2 ft  Activity Response Tolerated well  Mobility Referral Yes  Mobility visit 1 Mobility  Mobility Specialist Start Time (ACUTE ONLY) [id number]  Mobility Specialist Stop Time (ACUTE ONLY) 0945  Mobility Specialist Time Calculation (min) (ACUTE ONLY) 23 min    Pt was agreeable to mobility session - received in bed. MS donned/doffed RLE prosthesis. ModA throughout. Had a small BM in bed - MS assisted with peri care. Completed 2x STS and ambulated 2 steps forward and backward. C/o RLE pain but pain meds received earlier helped. Feeling a little SOB at EOS. Left in bed with all needs met, call bell in reach.   Maurene Capes Mobility Specialist Please contact via SecureChat or Rehab office at [phone]

## 2023-12-24 NOTE — Consult Note (Signed)
WOC consulted for foot wound, we have seen patient for this during this admission, verified orders up to date and in the EMR Will not consult for this reason  Kaan Tosh [facility], CNS, CWON-AP [phone]

## 2023-12-25 DIAGNOSIS — N179 Acute kidney failure, unspecified: Secondary | ICD-10-CM | POA: Diagnosis not present

## 2023-12-25 LAB — BASIC METABOLIC PANEL
Anion gap: 9 (ref 5–15)
BUN: 54 mg/dL — ABNORMAL HIGH (ref 8–23)
CO2: 30 mmol/L (ref 22–32)
Calcium: 8.8 mg/dL — ABNORMAL LOW (ref 8.9–10.3)
Chloride: 91 mmol/L — ABNORMAL LOW (ref 98–111)
Creatinine, Ser: 1.61 mg/dL — ABNORMAL HIGH (ref 0.61–1.24)
GFR, Estimated: 42 mL/min — ABNORMAL LOW (ref >60)
Glucose, Bld: 254 mg/dL — ABNORMAL HIGH (ref 70–99)
Potassium: 5.3 mmol/L — ABNORMAL HIGH (ref 3.5–5.1)
Sodium: 130 mmol/L — ABNORMAL LOW (ref 135–145)

## 2023-12-25 LAB — CBC WITH DIFFERENTIAL/PLATELET
Abs Immature Granulocytes: 0.04 10*3/uL (ref 0.00–0.07)
Basophils Absolute: 0 10*3/uL (ref 0.0–0.1)
Basophils Relative: 0 %
Eosinophils Absolute: 0.4 10*3/uL (ref 0.0–0.5)
Eosinophils Relative: 4 %
HCT: 29.8 % — ABNORMAL LOW (ref 39.0–52.0)
Hemoglobin: 9.5 g/dL — ABNORMAL LOW (ref 13.0–17.0)
Immature Granulocytes: 1 %
Lymphocytes Relative: 11 %
Lymphs Abs: 0.9 10*3/uL (ref 0.7–4.0)
MCH: 28.6 pg (ref 26.0–34.0)
MCHC: 31.9 g/dL (ref 30.0–36.0)
MCV: 89.8 fL (ref 80.0–100.0)
Monocytes Absolute: 1 10*3/uL (ref 0.1–1.0)
Monocytes Relative: 11 %
Neutro Abs: 6.1 10*3/uL (ref 1.7–7.7)
Neutrophils Relative %: 73 %
Platelets: 150 10*3/uL (ref 150–400)
RBC: 3.32 MIL/uL — ABNORMAL LOW (ref 4.22–5.81)
RDW: 16.9 % — ABNORMAL HIGH (ref 11.5–15.5)
WBC: 8.4 10*3/uL (ref 4.0–10.5)
nRBC: 0 % (ref 0.0–0.2)

## 2023-12-25 LAB — GLUCOSE, CAPILLARY
Glucose-Capillary: 267 mg/dL — ABNORMAL HIGH (ref 70–99)
Glucose-Capillary: 324 mg/dL — ABNORMAL HIGH (ref 70–99)
Glucose-Capillary: 248 mg/dL — ABNORMAL HIGH (ref 70–99)
Glucose-Capillary: 176 mg/dL — ABNORMAL HIGH (ref 70–99)

## 2023-12-25 LAB — POTASSIUM: Potassium: 5.4 mmol/L — ABNORMAL HIGH (ref 3.5–5.1)

## 2023-12-25 MED ORDER — INSULIN ASPART 100 UNIT/ML IJ SOLN
8.0000 [IU] | Freq: Three times a day (TID) | INTRAMUSCULAR | Status: DC
  Administered 2023-12-25 – 2023-12-27 (×7): 8 [IU] via SUBCUTANEOUS

## 2023-12-25 MED ORDER — SODIUM ZIRCONIUM CYCLOSILICATE 10 G PO PACK
10.0000 g | PACK | Freq: Once | ORAL | Status: AC
  Administered 2023-12-25: 10 g via ORAL
  Filled 2023-12-25: qty 1

## 2023-12-25 NOTE — Plan of Care (Signed)
  Problem: Education: Goal: Knowledge of risk factors and measures for prevention of condition will improve Outcome: Progressing   Problem: Coping: Goal: Psychosocial and spiritual needs will be supported Outcome: Progressing   Problem: Respiratory: Goal: Will maintain a patent airway Outcome: Progressing Goal: Complications related to the disease process, condition or treatment will be avoided or minimized Outcome: Progressing   Problem: Education: Goal: Ability to describe self-care measures that may prevent or decrease complications (Diabetes Survival Skills Education) will improve Outcome: Progressing Goal: Individualized Educational Video(s) Outcome: Progressing   Problem: Coping: Goal: Ability to adjust to condition or change in health will improve Outcome: Progressing   Problem: Fluid Volume: Goal: Ability to maintain a balanced intake and output will improve Outcome: Progressing   Problem: Health Behavior/Discharge Planning: Goal: Ability to identify and utilize available resources and services will improve Outcome: Progressing Goal: Ability to manage health-related needs will improve Outcome: Progressing   Problem: Metabolic: Goal: Ability to maintain appropriate glucose levels will improve Outcome: Progressing   Problem: Nutritional: Goal: Maintenance of adequate nutrition will improve Outcome: Progressing Goal: Progress toward achieving an optimal weight will improve Outcome: Progressing   Problem: Skin Integrity: Goal: Risk for impaired skin integrity will decrease Outcome: Progressing   Problem: Tissue Perfusion: Goal: Adequacy of tissue perfusion will improve Outcome: Progressing   Problem: Education: Goal: Knowledge of General Education information will improve Description: Including pain rating scale, medication(s)/side effects and non-pharmacologic comfort measures Outcome: Progressing   Problem: Health Behavior/Discharge Planning: Goal:  Ability to manage health-related needs will improve Outcome: Progressing   Problem: Clinical Measurements: Goal: Ability to maintain clinical measurements within normal limits will improve Outcome: Progressing Goal: Will remain free from infection Outcome: Progressing Goal: Diagnostic test results will improve Outcome: Progressing Goal: Respiratory complications will improve Outcome: Progressing Goal: Cardiovascular complication will be avoided Outcome: Progressing   Problem: Activity: Goal: Risk for activity intolerance will decrease Outcome: Progressing   Problem: Nutrition: Goal: Adequate nutrition will be maintained Outcome: Progressing   Problem: Coping: Goal: Level of anxiety will decrease Outcome: Progressing   Problem: Elimination: Goal: Will not experience complications related to bowel motility Outcome: Progressing Goal: Will not experience complications related to urinary retention Outcome: Progressing   Problem: Pain Management: Goal: General experience of comfort will improve Outcome: Progressing   Problem: Safety: Goal: Ability to remain free from injury will improve Outcome: Progressing   Problem: Skin Integrity: Goal: Risk for impaired skin integrity will decrease Outcome: Progressing

## 2023-12-25 NOTE — Plan of Care (Signed)
  Problem: Education: Goal: Knowledge of risk factors and measures for prevention of condition will improve Outcome: Progressing   Problem: Coping: Goal: Psychosocial and spiritual needs will be supported Outcome: Progressing   Problem: Respiratory: Goal: Will maintain a patent airway Outcome: Progressing   

## 2023-12-25 NOTE — Progress Notes (Signed)
Pt places self on.off.   12/25/23 2148  BiPAP/CPAP/SIPAP  BiPAP/CPAP/SIPAP Pt Type Adult  BiPAP/CPAP/SIPAP  (Home unit)

## 2023-12-25 NOTE — Inpatient Diabetes Management (Signed)
Inpatient Diabetes Program Recommendations  AACE/ADA: New Consensus Statement on Inpatient Glycemic Control (2015)  Target Ranges:  Prepandial:   less than 140 mg/dL      Peak postprandial:   less than 180 mg/dL (1-2 hours)      Critically ill patients:  140 - 180 mg/dL   Lab Results  Component Value Date   GLUCAP 267 (H) 12/25/2023   HGBA1C 8.5 (H) 12/06/2023    Latest Reference Range & Units 12/24/23 07:29 12/24/23 11:44 12/24/23 16:17 12/24/23 20:06 12/25/23 07:51  Glucose-Capillary 70 - 99 mg/dL 161 (H) 096 (H) 045 (H) 172 (H) 267 (H)  (H): Data is abnormally high  Diabetes history: DM 2 Outpatient Diabetes medications: Farxiga 10 mg Daily, Humalog 10-15 units tid, Tresiba 20 units Daily Current orders for Inpatient glycemic control:  Semglee 36 units Daily Novolog 4 units tid meal coverage Novolog 0-20 units tid, 0-5 units hs correction   Nepro BID between meals   Inpatient Diabetes Program Recommendations:   Postprandial CBGs remain elevated with Novolog 4 units meal coverage. Please consider: -Increase Novolog meal coverage to 8 units tid if eating >50% of meals  Thank you, Shane [name]. Shane Wynia, RN, MSN, CDCES  Diabetes Coordinator Inpatient Glycemic Control Team Team Pager [phone] (8am-5pm) 12/25/2023 11:26 AM

## 2023-12-25 NOTE — Progress Notes (Signed)
Physical Therapy Treatment Patient Details Name: Alonte Holdaway MRN: [id number] DOB: 01/05/1941 Today's Date: 12/25/2023   History of Present Illness 83 year old male presented to ED 1/1 with shortness of breath.  Dx: Covid 19 PNA, Acute kidney injury, Acute hypoxic respiratory failure, Normocytic Anemia - secondary to RP bleed and critical illness, Acute HF with reduced EF, s/p CRRT. With significant past medical history including obstructive sleep apnea on CPAP at night, CAD s/p CABG x3 in 2021, aortic stenosis s/p [facility], HFpEF, atrial fibrillation on Eliquis, plavix, sick sinus syndrome s/p leadless PPM, hypertension, hyperlipidemia, GERD, uncontrolled diabetes, CKD 3, pulmonary fibrosis    PT Comments  Pt with improved tolerance for mobility this session, transferring multiple times. Pt does continue to require physical assistance to stand, as well as elevated bed height, and his standing tolerance does decrease significantly within this session. Pt does ambulate for a very short distances at this time, and will benefit from continued frequent mobilization in an effort to improve balance and endurance. Pt was denied AIR placement. Patient will benefit from continued inpatient follow up therapy, <3 hours/day.    If plan is discharge home, recommend the following: A lot of help with walking and/or transfers;A lot of help with bathing/dressing/bathroom;Assistance with cooking/housework;Assist for transportation;Help with stairs or ramp for entrance   Can travel by private vehicle     No  Equipment Recommendations   (defer to post-acute)    Recommendations for Other Services       Precautions / Restrictions Precautions Precautions: Fall Precaution Comments: Lt distal-lateral foot wound. 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 Wears 2 L O2 at night Required Braces or Orthoses: Other Brace Other Brace: RLE prosthesis. Needs to continue to wear shrinker Restrictions Weight Bearing  Restrictions Per Provider Order: No     Mobility  Bed Mobility Overal bed mobility: Needs Assistance Bed Mobility: Supine to Sit, Sit to Supine     Supine to sit: Supervision, HOB elevated, Used rails Sit to supine: Contact guard assist        Transfers Overall transfer level: Needs assistance Equipment used: Rolling walker (2 wheels) Transfers: Sit to/from Stand Sit to Stand: Min assist, From elevated surface           General transfer comment: assist to power up into standing as well as verbal cues for increased trunk flexion and anterior weight shift    Ambulation/Gait Ambulation/Gait assistance: Min assist Gait Distance (Feet): 2 Feet (2' forward and then backward from bedside) Assistive device: Rolling walker (2 wheels) Gait Pattern/deviations: Step-to pattern Gait velocity: reduced Gait velocity interpretation: <1.31 ft/sec, indicative of household ambulator   General Gait Details: short step-to gait, reduced foot clearance bilaterally   Stairs             Wheelchair Mobility     Tilt Bed    Modified Rankin (Stroke Patients Only)       Balance Overall balance assessment: Needs assistance Sitting-balance support: No upper extremity supported, Feet supported Sitting balance-Leahy Scale: Fair     Standing balance support: Bilateral upper extremity supported, Reliant on assistive device for balance Standing balance-Leahy Scale: Poor                              Cognition Arousal: Alert Behavior During Therapy: WFL for tasks assessed/performed Overall Cognitive Status: Within Functional Limits for tasks assessed  Exercises      General Comments General comments (skin integrity, edema, etc.): VSS on RA. Pt remains incontinent of stool, PT assists in hygiene tasks at end of session      Pertinent Vitals/Pain Pain Assessment Pain Assessment: Faces Faces Pain Scale:  Hurts little more Pain Location: buttocks Pain Descriptors / Indicators: Sore Pain Intervention(s): Monitored during session    Home Living                          Prior Function            PT Goals (current goals can now be found in the care plan section) Acute Rehab PT Goals Patient Stated Goal: Get well, go home PT Goal Formulation: With patient Time For Goal Achievement: 01/08/24 Potential to Achieve Goals: Fair Progress towards PT goals: Progressing toward goals    Frequency    Min 1X/week      PT Plan      Co-evaluation              AM-PAC PT "6 Clicks" Mobility   Outcome Measure  Help needed turning from your back to your side while in a flat bed without using bedrails?: A Little Help needed moving from lying on your back to sitting on the side of a flat bed without using bedrails?: A Little Help needed moving to and from a bed to a chair (including a wheelchair)?: A Lot Help needed standing up from a chair using your arms (e.g., wheelchair or bedside chair)?: A Lot Help needed to walk in hospital room?: Total Help needed climbing 3-5 steps with a railing? : Total 6 Click Score: 12    End of Session Equipment Utilized During Treatment: Gait belt Activity Tolerance: Patient tolerated treatment well Patient left: in bed;with call bell/phone within reach;with bed alarm set Nurse Communication: Mobility status;Need for lift equipment PT Visit Diagnosis: Muscle weakness (generalized) (M62.81);Difficulty in walking, not elsewhere classified (R26.2);Pain Pain - Right/Left: Right     Time: 1610-9604 PT Time Calculation (min) (ACUTE ONLY): 24 min  Charges:    $Therapeutic Activity: 23-37 mins PT General Charges $$ ACUTE PT VISIT: 1 Visit                     Arlyss Gandy, PT, DPT Acute Rehabilitation Office [phone]    Arlyss Gandy 12/25/2023, 5:07 PM

## 2023-12-25 NOTE — Progress Notes (Signed)
PROGRESS NOTE    Shane [name]  EAV:[id number] DOB: 06/28/1941 DOA: 12/06/2023 PCP: [name], MD    Brief Narrative:  This 83 year old male with significant past medical history including obstructive sleep apnea on CPAP at night, CAD s/p CABG x3 in 2021, aortic stenosis s/p TAVR 08/2023 at [facility], HFpEF, atrial fibrillation on Eliquis, plavix, sick sinus syndrome s/p leadless PPM, hypertension, hyperlipidemia, GERD, uncontrolled diabetes, CKD 3, pulmonary fibrosis followed by Dr. Marchelle Gearing, who presented to the emergency department on 12/06/2023 with shortness of breath.  EMS was called and found to be hypotensive and hypoxic. He was started on nonrebreather and an epinephrine infusion prior to arrival. In the ED afebrile, SBP around 100, tachypneic. Labs +COVID, AKI on CKD with BUN/Cr 116, 4.69 with critical K of 7.1. bicarb 16. CBC without leukocytosis, hgb 9.5. lactic 2.3>1.9, troponin 8,290, UA negative, blood cultures pending. CXR with likely pulmonary edema. With new AKI, hyperkalemia and EKG changes, nephrology was consulted.  He was admitted by CCM for covid pneumonia and AKI, hyperkalemia subsequently underwent CRRT. Subsequently his renal parameters improved. Cardiology also consulted   for NSTEMI, recommended medical management.    Assessment & Plan:   Principal Problem:   Acute kidney injury (nontraumatic) (HCC) Active Problems:   COVID-19 virus infection   PAD (peripheral artery disease) (HCC)   Atrial fibrillation (HCC)   Hyperkalemia   Acute on chronic congestive heart failure (HCC)   Non-ST elevation (NSTEMI) myocardial infarction [facility])   Retroperitoneal bleeding   History of transcatheter aortic valve replacement (TAVR)   Chronic heart failure with preserved ejection fraction (HFpEF) (HCC)   Hx of CABG   Atherosclerosis of native coronary artery of native heart without angina pectoris   Acute on chronic hypoxic respiratory failure (HCC)   Anemia   Esophageal  dysphagia   COVID-19   Type 2 diabetes mellitus without complication, with long-term current use of insulin (HCC)   Acute on chronic hypoxic respiratory failure, multifactorial: Likely due to COVID+, pulmonary edema due to acute systolic CHF, underlying pulmonary fibrosis. At baseline patient uses 2 L of supplemental oxygen via [place]. Patient presented with cough, shortness of breath x 3 days with no improvement on home nebulizers. Patient was found to be severely hypoxic requiring nonrebreather on arrival. Patient states his wife and grandson had been sick with COVID infection recently. SARS coronavirus 2 PCR positive, influenza A and B by PCR negative. Initial chest x-ray findings concerning for CHF exacerbation. Patient has completed treatment for COVID with remdesivir and Decadron. Patient has undergone brief CRRT during this hospitalization. Patient with clinical improvement. HD tunneled cath removed per Nephro Continue Lasix 40 mg p.o. twice daily Patient has completed over 10 days of isolation.  Therapy eval recommending SNF as CIR was denied.    NSTEMI : Secondary to demand ischemia in the setting of COVID-19 infection with underlying cardiomyopathy. Patient denies any chest pain. Echo showed LVEF 40%,WMA, status post bioprosthetic aortic valve /status post TAVR. Cardiology recommended medical management. Heparin discontinued due to retroperitoneal bleeding.   Acute retroperitoneal bleed : CTA/P showed retroperitoneal bleeding. Status post transfusion of 1 unit platelets, 2 units PRBCs, thiamine, DDAVP. Anticoagulation for thromboembolic prophylaxis given A-fib on hold due to risk of bleeding. Due to presentation with NSTEMI and cardiac history patient was transfused 2 units PRBCs on 12/12/2023.  Repeat CT A/P (12/12/2023 ), with redemonstrated large right retroperitoneal hematoma with hematocrit level.  Patient reports worsening pain on coughing or deep breathing on the RUQ ,  concerned for worsening retroperitoneal bleed.  Repeat CT A/P: No significant interval change in right retroperitoneal hematoma. H/H remains stable.    Paroxysmal atrial fibrillation: Rate controlled.  Anticoagulation on hold sec to retroperitoneal bleed.  Recommend outpatient follow up with cardiology to see if he is a candidate for restarting anti coagulation.     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 Follow up outpatient.   Hyperlipidemia Continue Lipitor 80 mg daily.   CAD s/p CABG s/p TAVR Aspirin, and heparin discontinued.     Acute kidney injury: CRRT on 1/2 to 1/3.  Creatinine is at 1.6 today.    Left chronic foot wound: Wound On the plantar aspect. Wound care on board.  Wife at bedside .    Acute blood loss anemia/ Normocytic anemia/ Anemia of chronic disease: Hemoglobin stable around 10.    Hyponatremia: Improved.    Hyperkalemia : Lokelma given x 1, Recheck Potassium. AG metabolic acidosis Resolved.   Shock resolved.  Not largely cardiogenic but cannot rule out a septic component.. Status post dose of antibiotic coverage for community-acquired pneumonia and status post treatment for COVID infection. Shock resolved.    Dysphagia Recommend outpatient follow up with GI    Type 2 DM uncontrolled with hyperglycemia.  Increase the Semglee to 36 units , Continue with novolog 4 units TIDAC AND SSI.    RN Pressure Injury Documentation:   Malnutrition Type:   Nutrition Problem: Inadequate oral intake Etiology: chronic illness (CAD, AS, CKD, DM)     Malnutrition Characteristics:   Signs/Symptoms: per patient/family report     Nutrition Interventions:   Interventions: MVI, Nepro shake, Refer to RD note for recommendations   Estimated body mass index is 31.81 kg/m as calculated from the following:   Height as of this encounter: 6' (1.829 m).   Weight as of this encounter: 106.4 kg.     DVT  prophylaxis: SCDs Code Status: Full code Family Communication: Grandson at bedside Disposition Plan:    Status is: Inpatient Remains inpatient appropriate because: SNF placement.   Consultants:  Nephrology General Surgery  IR Gastroenterology  Cardiology    Procedures: CRRT Antimicrobials: Anti-infectives (From admission, onward)    Start     Dose/Rate Route Frequency Ordered Stop   12/07/23 1000  remdesivir 100 mg in sodium chloride 0.9 % 100 mL IVPB       Placed in "Followed by" Linked Group   100 mg 200 mL/hr over 30 Minutes Intravenous Daily 12/06/23 2239 12/08/23 0947   12/06/23 2245  remdesivir 200 mg in sodium chloride 0.9% 250 mL IVPB       Placed in "Followed by" Linked Group   200 mg 580 mL/hr over 30 Minutes Intravenous Once 12/06/23 2239 12/07/23 0142   12/06/23 1815  cefTRIAXone (ROCEPHIN) 2 g in sodium chloride 0.9 % 100 mL IVPB        2 g 200 mL/hr over 30 Minutes Intravenous Once 12/06/23 1812 12/06/23 1908   12/06/23 1815  azithromycin (ZITHROMAX) 500 mg in sodium chloride 0.9 % 250 mL IVPB        500 mg 250 mL/hr over 60 Minutes Intravenous  Once 12/06/23 1812 12/06/23 2153      Subjective: Patient was seen and examined at bedside.  Overnight events noted.   Patient reports doing much better.  He states pain in the right upper quadrant is improved. He is awaiting SNF placement.  Objective: Vitals:   12/24/23 2005 12/25/23 0454 12/25/23 0500 12/25/23  0752  BP: 106/69 103/80  (!) 146/72  Pulse: 70 72  77  Resp: 18 18  17   Temp: 98.6 F (37 C) 98 F (36.7 C)    TempSrc: Oral Oral    SpO2: 100%   93%  Weight:   105.5 kg   Height:        Intake/Output Summary (Last 24 hours) at 12/25/2023 1020 Last data filed at 12/25/2023 0542 Gross per 24 hour  Intake 240 ml  Output 1800 ml  Net -1560 ml   Filed Weights   12/23/23 0515 12/24/23 0400 12/25/23 0500  Weight: 106.2 kg 106.4 kg 105.5 kg    Examination:  General exam: Appears calm and  comfortable, deconditioned, not in any acute distress. Respiratory system: Clear to auscultation. Respiratory effort normal.  RR 15 Cardiovascular system: S1 & S2 heard, RRR. No JVD, murmurs, rubs, gallops or clicks.  Gastrointestinal system: Abdomen is nondistended, soft and nontender. Normal bowel sounds heard. Central nervous system: Alert and oriented x 3. No focal neurological deficits. 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 Skin: No rashes, lesions or ulcers Psychiatry: Judgement and insight appear normal. Mood & affect appropriate.     Data Reviewed: I have personally reviewed following labs and imaging studies  CBC: Recent Labs  Lab 12/20/23 0637 12/21/23 0635 12/22/23 0753 12/23/23 0649 12/24/23 1825 12/25/23 0602  WBC 12.0* 10.1 8.7 8.8  --  8.4  NEUTROABS  --   --   --   --   --  6.1  HGB 11.7* 11.0* 10.5* 10.4* 10.5* 9.5*  HCT 36.5* 34.8* 32.8* 32.7* 33.0* 29.8*  MCV 88.4 89.2 88.4 88.9  --  89.8  PLT 182 162 149* 156  --  150   Basic Metabolic Panel: Recent Labs  Lab 12/21/23 0635 12/22/23 0753 12/23/23 0649 12/24/23 0826 12/25/23 0602 12/25/23 0903  NA 131* 129* 132* 132* 130*  --   K 4.9 4.5 4.8 5.0 5.3* 5.4*  CL 91* 92* 91* 93* 91*  --   CO2 30 30 32 30 30  --   GLUCOSE 205* 270* 202* 307* 254*  --   BUN 49* 45* 47* 47* 54*  --   CREATININE 1.53* 1.28* 1.58* 1.38* 1.61*  --   CALCIUM 8.4* 8.4* 8.8* 8.7* 8.8*  --   MG 1.6* 1.9  --   --   --   --   PHOS 3.8 2.8  --   --   --   --    GFR: Estimated Creatinine Clearance: 44.4 mL/min (A) (by C-G formula based on SCr of 1.61 mg/dL (H)). Liver Function Tests: Recent Labs  Lab 12/20/23 0637  AST 26  ALT 22  ALKPHOS 97  BILITOT 1.6*  PROT 6.2*  ALBUMIN 2.5*   No results for input(s): "LIPASE", "AMYLASE" in the last 168 hours. No results for input(s): "AMMONIA" in the last 168 hours. Coagulation Profile: No results for input(s): "INR", "PROTIME" in the last 168 hours. Cardiac  Enzymes: No results for input(s): "CKTOTAL", "CKMB", "CKMBINDEX", "TROPONINI" in the last 168 hours. BNP (last 3 results) No results for input(s): "PROBNP" in the last 8760 hours. HbA1C: No results for input(s): "HGBA1C" in the last 72 hours. CBG: Recent Labs  Lab 12/24/23 0729 12/24/23 1144 12/24/23 1617 12/24/23 2006 12/25/23 0751  GLUCAP 273* 298* 275* 172* 267*   Lipid Profile: No results for input(s): "CHOL", "HDL", "LDLCALC", "TRIG", "CHOLHDL", "LDLDIRECT" in the last 72 hours. Thyroid Function Tests: No results for input(s): "  TSH", "T4TOTAL", "FREET4", "T3FREE", "THYROIDAB" in the last 72 hours. Anemia Panel: No results for input(s): "VITAMINB12", "FOLATE", "FERRITIN", "TIBC", "IRON", "RETICCTPCT" in the last 72 hours. Sepsis Labs: No results for input(s): "PROCALCITON", "LATICACIDVEN" in the last 168 hours.  No results found for this or any previous visit (from the past 240 hours).   Radiology Studies: CT ABDOMEN PELVIS WO CONTRAST Result Date: 12/24/2023 CLINICAL DATA:  Retroperitoneal hematoma, progressive right flank pain EXAM: CT ABDOMEN AND PELVIS WITHOUT CONTRAST TECHNIQUE: Multidetector CT imaging of the abdomen and pelvis was performed following the standard protocol without IV contrast. RADIATION DOSE REDUCTION: This exam was performed according to the departmental dose-optimization program which includes automated exposure control, adjustment of the mA and/or kV according to patient size and/or use of iterative reconstruction technique. 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 This is not well assessed on this examination. Previously noted pleural effusions have resolved. Status post transcatheter aortic  valve replacement. Coronary artery bypass grafting has been performed. Cardiac size is at the upper limits of normal. Hepatobiliary: No focal liver abnormality is seen. No gallstones, gallbladder wall thickening, or biliary dilatation. Pancreas: Unremarkable Spleen: Unremarkable Adrenals/Urinary Tract: The adrenal glands are unremarkable. The right kidney is displaced anteriorly by the retroperitoneal hematoma, however, appears unchanged. The kidneys are otherwise unremarkable on this noncontrast examination. Previously noted Foley catheter has been removed. Punctate focus of nondependent gas within the bladder lumen likely relates recent catheterization. The bladder is otherwise unremarkable. Stomach/Bowel: Stomach is within normal limits. Appendix appears normal. No evidence of bowel wall thickening, distention, or inflammatory changes. Vascular/Lymphatic: Aortic atherosclerosis. No enlarged abdominal or pelvic lymph nodes. Reproductive: Prostate is unremarkable. Other: Right retroperitoneal hematoma is unchanged, measuring roughly 11.2 x 6.0 x 17.0 cm in greatest dimension, arising from the right psoas. No abdominal wall hernia. Dependent subcutaneous body wall edema is unchanged. Musculoskeletal: Osseous structures are age-appropriate. L3 and L4 posterior decompression has been performed. Bilateral L5 pars defects are present without associated spondylolisthesis. No acute bone abnormality. IMPRESSION: 1. No significant interval change in right retroperitoneal hematoma, arising from the right psoas. 2. Interval resolution of previously noted pleural effusions. 3. 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 Aortic Atherosclerosis (ICD10-I70.0). Electronically Signed   By: Helyn Numbers M.D.   On: 12/24/2023 20:05   Scheduled Meds:  atorvastatin  80 mg Oral Daily   Chlorhexidine Gluconate Cloth  6 each Topical Daily   vitamin B-12  1,000 mcg Oral Daily   docusate  sodium  100 mg Oral BID   escitalopram  10 mg Oral Daily   feeding supplement (NEPRO CARB STEADY)  237 mL Oral BID BM   furosemide  40 mg Oral BID   gabapentin  300 mg Oral Daily   Gerhardt's butt cream   Topical BID   insulin aspart  0-20 Units Subcutaneous TID WC   insulin aspart  0-5 Units Subcutaneous QHS   insulin aspart  4 Units Subcutaneous TID WC   insulin glargine-yfgn  36 Units Subcutaneous Daily   metoprolol tartrate  25 mg Oral Daily   multivitamin  1 tablet Oral QHS   pantoprazole  40 mg Oral Daily   polyethylene glycol  17 g Oral TID BM   sodium zirconium cyclosilicate  10 g Oral Once   traZODone  75 mg Oral QHS   zinc sulfate (50mg  elemental zinc)  220 mg Oral Daily   Continuous Infusions:   LOS: 19 days    Time spent: 35 mins    Willeen Niece, MD [facility]   If 7PM-7AM, please contact night-coverage

## 2023-12-26 DIAGNOSIS — N179 Acute kidney failure, unspecified: Secondary | ICD-10-CM | POA: Diagnosis not present

## 2023-12-26 LAB — GLUCOSE, CAPILLARY
Glucose-Capillary: 263 mg/dL — ABNORMAL HIGH (ref 70–99)
Glucose-Capillary: 197 mg/dL — ABNORMAL HIGH (ref 70–99)
Glucose-Capillary: 327 mg/dL — ABNORMAL HIGH (ref 70–99)
Glucose-Capillary: 247 mg/dL — ABNORMAL HIGH (ref 70–99)

## 2023-12-26 LAB — BASIC METABOLIC PANEL
Anion gap: 8 (ref 5–15)
BUN: 54 mg/dL — ABNORMAL HIGH (ref 8–23)
CO2: 33 mmol/L — ABNORMAL HIGH (ref 22–32)
Calcium: 9.1 mg/dL (ref 8.9–10.3)
Chloride: 90 mmol/L — ABNORMAL LOW (ref 98–111)
Creatinine, Ser: 1.49 mg/dL — ABNORMAL HIGH (ref 0.61–1.24)
GFR, Estimated: 47 mL/min — ABNORMAL LOW (ref >60)
Glucose, Bld: 249 mg/dL — ABNORMAL HIGH (ref 70–99)
Potassium: 4.9 mmol/L (ref 3.5–5.1)
Sodium: 131 mmol/L — ABNORMAL LOW (ref 135–145)

## 2023-12-26 LAB — CBC
HCT: 31.1 % — ABNORMAL LOW (ref 39.0–52.0)
Hemoglobin: 9.9 g/dL — ABNORMAL LOW (ref 13.0–17.0)
MCH: 28.9 pg (ref 26.0–34.0)
MCHC: 31.8 g/dL (ref 30.0–36.0)
MCV: 90.7 fL (ref 80.0–100.0)
Platelets: 164 10*3/uL (ref 150–400)
RBC: 3.43 MIL/uL — ABNORMAL LOW (ref 4.22–5.81)
RDW: 17 % — ABNORMAL HIGH (ref 11.5–15.5)
WBC: 6.4 10*3/uL (ref 4.0–10.5)
nRBC: 0 % (ref 0.0–0.2)

## 2023-12-26 LAB — PHOSPHORUS: Phosphorus: 5.1 mg/dL — ABNORMAL HIGH (ref 2.5–4.6)

## 2023-12-26 LAB — MAGNESIUM: Magnesium: 1.6 mg/dL — ABNORMAL LOW (ref 1.7–2.4)

## 2023-12-26 MED ORDER — MAGNESIUM SULFATE 2 GM/50ML IV SOLN
2.0000 g | Freq: Once | INTRAVENOUS | Status: AC
  Administered 2023-12-26: 2 g via INTRAVENOUS
  Filled 2023-12-26: qty 50

## 2023-12-26 NOTE — TOC Progression Note (Signed)
Transition of Care [facility]) - Progression Note    Patient Details  Name: Shane [name] MRN: [id number] Date of Birth: 30-Apr-1941  Transition of Care [facility]) CM/SW Contact  Lyndie Vanderloop A [place], [place] Phone Number: 12/26/2023, 11:58 AM  Clinical Narrative:     CSW reached out to [place] at [place], there are currently no bed offers. Pt and pt's family do not want to attend a different facility, [facility] leadership made aware.    TOC will continue to follow.        Expected Discharge Plan and Services                                               Social Determinants of Health (SDOH) Interventions SDOH Screenings   Food Insecurity: No Food Insecurity (12/07/2023)  Housing: Low Risk  (12/07/2023)  Transportation Needs: No Transportation Needs (12/07/2023)  Utilities: Not At Risk (12/07/2023)  Depression (PHQ2-9): Low Risk  (01/11/2021)  Financial Resource Strain: Low Risk  (08/22/2023)   Received from [facility]  Social Connections: Moderately Isolated (12/07/2023)  Tobacco Use: Low Risk  (12/07/2023)    Readmission Risk Interventions    12/15/2023    2:29 PM  Readmission Risk Prevention Plan  Transportation Screening Complete  Medication Review (RN Care Manager) Complete  PCP or Specialist appointment within 3-5 days of discharge Complete  HRI or Home Care Consult Complete  SW Recovery Care/Counseling Consult Complete  Palliative Care Screening Complete  Skilled Nursing Facility Not Applicable

## 2023-12-26 NOTE — Progress Notes (Signed)
Occupational Therapy Treatment Patient Details Name: Shane [name] MRN: [id number] DOB: 30-Oct-1941 Today's Date: 12/26/2023   History of present illness 83 year old male presented to ED 1/1 with shortness of breath.  Dx: Covid 19 PNA, Acute kidney injury, Acute hypoxic respiratory failure, Normocytic Anemia - secondary to RP bleed and critical illness, Acute HF with reduced EF, s/p CRRT. With significant past medical history including obstructive sleep apnea on CPAP at night, CAD s/p CABG x3 in 2021, aortic stenosis s/p T[facility]R 08/2023 at [facility], HFpEF, atrial fibrillation on Eliquis, plavix, sick sinus syndrome s/p leadless PPM, hypertension, hyperlipidemia, GERD, uncontrolled diabetes, CKD 3, pulmonary fibrosis   OT comments  Pt reporting fatigue from earlier session with mobility specialist and politely declined OOB ADL assessments. Pt agreeable to UE HEP education sitting EOB for further balance challenge and core strengthening. Emphasis on breathing techniques, self monitoring need for rest breaks and encouraged HEP completion outside of therapy sessions. Provided handout to maximize carryover. Pt and family remain hopeful for postacute rehab at their preferred venue.       If plan is discharge home, recommend the following:  A lot of help with walking and/or transfers;Two people to help with walking and/or transfers;A lot of help with bathing/dressing/bathroom   Equipment Recommendations  None recommended by OT    Recommendations for Other Services      Precautions / Restrictions Precautions Precautions: Fall Precaution Comments: Lt distal-lateral foot wound. 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 Wears 2 L O2 at night Required Braces or Orthoses: Other Brace Other Brace: RLE prosthesis. Needs to continue to wear shrinker Restrictions Weight Bearing Restrictions Per Provider Order: No       Mobility Bed Mobility Overal bed mobility: Needs Assistance Bed Mobility: Supine to Sit, Sit to  Supine, Rolling Rolling: Modified independent (Device/Increase time)   Supine to sit: Supervision, HOB elevated, Used rails Sit to supine: Supervision   General bed mobility comments: use of bedrails and increased effort but no physical assist needed    Transfers Overall transfer level: Needs assistance                 General transfer comment: declined standing attempts at this time but able to demo scooting laterally toward [facility] without assist     Balance Overall balance assessment: Needs assistance Sitting-balance support: No upper extremity supported, Feet supported Sitting balance-Leahy Scale: Good Sitting balance - Comments: able to complete UE HEP EOB without LOB                                   ADL either performed or assessed with clinical judgement   ADL Overall ADL's : Needs assistance/impaired                             Toileting- Clothing Manipulation and Hygiene: Maximal assistance;Bed level Toileting - Clothing Manipulation Details (indicate cue type and reason): smear noted on bed pad, required Max A for posterior hygiene bed level; declined need for [facility] use       General ADL Comments: Emphasis on UE HEP education as pt reportedly managed most ADLs already and just worked with mobility specialist so endorsed fatigue    Extremity/Trunk Assessment Upper Extremity Assessment Upper Extremity Assessment: Overall WFL for tasks assessed;Right hand dominant   Lower Extremity Assessment Lower Extremity Assessment: Defer to PT evaluation  Vision   Vision Assessment?: No apparent visual deficits   Perception     Praxis      Cognition Arousal: Alert Behavior During Therapy: WFL for tasks assessed/performed Overall Cognitive Status: Within Functional Limits for tasks assessed                                          Exercises Exercises: General Upper Extremity General Exercises - Upper  Extremity Shoulder Flexion: Strengthening, Both, 10 reps, Seated, Theraband Theraband Level (Shoulder Flexion): Level 3 (Green) Shoulder Horizontal ABduction: Strengthening, Both, 10 reps, Seated, Theraband Theraband Level (Shoulder Horizontal Abduction): Level 3 (Green) Elbow Flexion: Strengthening, Both, 10 reps, Seated, Theraband Theraband Level (Elbow Flexion): Level 3 (Green) Elbow Extension: Strengthening, Both, 10 reps, Seated, Theraband Theraband Level (Elbow Extension): Level 3 (Green)    Shoulder Instructions       General Comments Unable to obtain reading from pulse ox but pt reports O2 sats usually high 90s even if his breathing does not feel like its good    Pertinent Vitals/ Pain       Pain Assessment Pain Assessment: Faces Faces Pain Scale: No hurt Pain Intervention(s): Monitored during session  Home Living                                          Prior Functioning/Environment              Frequency  Min 1X/week        Progress Toward Goals  OT Goals(current goals can now be found in the care plan section)  Progress towards OT goals: Progressing toward goals  Acute Rehab OT Goals Patient Stated Goal: go to [facility] rehab OT Goal Formulation: With patient/family Time For Goal Achievement: 01/09/24 Potential to Achieve Goals: Good  Plan      Co-evaluation                 AM-PAC OT "6 Clicks" Daily Activity     Outcome Measure   Help from another person eating meals?: None Help from another person taking care of personal grooming?: A Little Help from another person toileting, which includes using toliet, bedpan, or urinal?: A Lot Help from another person bathing (including washing, rinsing, drying)?: A Lot Help from another person to put on and taking off regular upper body clothing?: A Little Help from another person to put on and taking off regular lower body clothing?: A Lot 6 Click Score: 16    End of Session     OT Visit Diagnosis: Unsteadiness on feet (R26.81);Other abnormalities of gait and mobility (R26.89);Muscle weakness (generalized) (M62.81)   Activity Tolerance Patient tolerated treatment well   Patient Left in bed;with call bell/phone within reach;with family/visitor present   Nurse Communication          Time: 4098-1191 OT Time Calculation (min): 28 min  Charges: OT General Charges $OT Visit: 1 Visit OT Treatments $Therapeutic Activity: 8-22 mins $Therapeutic Exercise: 8-22 mins  Bradd Canary, OTR/L Acute Rehab Services Office: [phone]   Lorre Munroe 12/26/2023, 12:18 PM

## 2023-12-26 NOTE — Progress Notes (Signed)
Pt places self on/off   12/26/23 2300  BiPAP/CPAP/SIPAP  BiPAP/CPAP/SIPAP Pt Type Adult  BiPAP/CPAP/SIPAP  (Home unit)  Patient Home Equipment Yes

## 2023-12-26 NOTE — Progress Notes (Incomplete)
Mobility Specialist: Progress Note   Pre-Mobility: HR, BP, SpO2 During Mobility: HR, SpO2 Post-Mobility: HR, BP, SpO2     Mobility Specialist Please contact via Special educational needs teacher or Rehab office at [phone]

## 2023-12-26 NOTE — Progress Notes (Signed)
PROGRESS NOTE    Shane [name]  WGN:[id number] DOB: 08-27-1941 DOA: 12/06/2023 PCP: [name], MD    Brief Narrative:  This 83 year old male with significant past medical history including obstructive sleep apnea on CPAP at night, CAD s/p CABG x3 in 2021, aortic stenosis s/p TAVR 08/2023 at [facility], HFpEF, atrial fibrillation on Eliquis, plavix, sick sinus syndrome s/p leadless PPM, hypertension, hyperlipidemia, GERD, uncontrolled diabetes, CKD 3, pulmonary fibrosis followed by Dr. Marchelle Gearing, who presented to the emergency department on 12/06/2023 with shortness of breath.  EMS was called and found to be hypotensive and hypoxic. He was started on nonrebreather and an epinephrine infusion prior to arrival. In the ED afebrile, SBP around 100, tachypneic. Labs +COVID, AKI on CKD with BUN/Cr 116, 4.69 with critical K of 7.1. bicarb 16. CBC without leukocytosis, hgb 9.5. lactic 2.3>1.9, troponin 8,290, UA negative, blood cultures pending. CXR with likely pulmonary edema. With new AKI, hyperkalemia and EKG changes, nephrology was consulted.  He was admitted by CCM for covid pneumonia and AKI, hyperkalemia subsequently underwent CRRT. Subsequently his renal parameters improved. Cardiology also consulted   for NSTEMI, recommended medical management.    Assessment & Plan:   Principal Problem:   Acute kidney injury (nontraumatic) (HCC) Active Problems:   COVID-19 virus infection   PAD (peripheral artery disease) (HCC)   Atrial fibrillation (HCC)   Hyperkalemia   Acute on chronic congestive heart failure (HCC)   Non-ST elevation (NSTEMI) myocardial infarction [facility])   Retroperitoneal bleeding   History of transcatheter aortic valve replacement (TAVR)   Chronic heart failure with preserved ejection fraction (HFpEF) (HCC)   Hx of CABG   Atherosclerosis of native coronary artery of native heart without angina pectoris   Acute on chronic hypoxic respiratory failure (HCC)   Anemia   Esophageal  dysphagia   COVID-19   Type 2 diabetes mellitus without complication, with long-term current use of insulin (HCC)   Acute on chronic hypoxic respiratory failure, multifactorial: Likely due to COVID+, pulmonary edema due to acute systolic CHF, underlying pulmonary fibrosis. At baseline patient uses 2 L of supplemental oxygen via [place]. Patient presented with cough, shortness of breath x 3 days with no improvement on home nebulizers. Patient was found to be severely hypoxic requiring nonrebreather on arrival. Patient states his wife and grandson had been sick with COVID infection recently. SARS coronavirus 2 PCR positive, influenza A and B by PCR negative. Initial chest x-ray findings concerning for CHF exacerbation. Patient has completed treatment for COVID with remdesivir and Decadron. Patient has undergone brief CRRT during this hospitalization. Patient with clinical improvement. HD tunneled cath removed per Nephro Continue Lasix 40 mg p.o. twice daily Patient has completed over 10 days of isolation.  Therapy eval recommending SNF as CIR was denied.    NSTEMI : Secondary to demand ischemia in the setting of COVID-19 infection with underlying cardiomyopathy. Patient denies any chest pain. Echo showed LVEF 40%,WMA, status post bioprosthetic aortic valve /status post TAVR. Cardiology recommended medical management. Heparin discontinued due to retroperitoneal bleeding.   Acute retroperitoneal bleed : CTA/P showed retroperitoneal bleeding. Status post transfusion of 1 unit platelets, 2 units PRBCs, thiamine, DDAVP. Anticoagulation for thromboembolic prophylaxis given A-fib on hold due to risk of bleeding. Due to presentation with NSTEMI and cardiac history patient was transfused 2 units PRBCs on 12/12/2023.  Repeat CT A/P (12/12/2023 ), with redemonstrated large right retroperitoneal hematoma with hematocrit level.  Patient reports worsening pain on coughing or deep breathing on the RUQ ,  concerned for worsening retroperitoneal bleed.  Repeat CT A/P: No significant interval change in right retroperitoneal hematoma. H/H remains stable.    Paroxysmal atrial fibrillation: Rate controlled.  Anticoagulation on hold sec to retroperitoneal bleed.  Recommend outpatient follow up with cardiology to see if he is a candidate for restarting anti coagulation.     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 Follow up outpatient.   Hyperlipidemia Continue Lipitor 80 mg daily.   CAD s/p CABG s/p TAVR Aspirin, and heparin discontinued.     Acute kidney injury: CRRT on 1/2 to 1/3.  Creatinine is at 1.49 today.    Left chronic foot wound: Wound On the plantar aspect. Wound care on board.  Wife at bedside .    Acute blood loss anemia/ Normocytic anemia/ Anemia of chronic disease: Hemoglobin stable around 10.    Hyponatremia: Improved.    Hyperkalemia : Lokelma given x 1, Recheck Potassium. AG metabolic acidosis Resolved.   Shock resolved.  Not largely cardiogenic but cannot rule out a septic component.. Status post dose of antibiotic coverage for community-acquired pneumonia and status post treatment for COVID infection. Shock resolved.  Dysphagia Recommend outpatient follow up with GI    Type 2 DM uncontrolled with hyperglycemia.  Increase the Semglee to 36 units , Continue with novolog 4 units TIDAC AND SSI.    RN Pressure Injury Documentation:   Malnutrition Type:   Nutrition Problem: Inadequate oral intake Etiology: chronic illness (CAD, AS, CKD, DM)     Malnutrition Characteristics:   Signs/Symptoms: per patient/family report     Nutrition Interventions:   Interventions: MVI, Nepro shake, Refer to RD note for recommendations   Estimated body mass index is 31.81 kg/m as calculated from the following:   Height as of this encounter: 6' (1.829 m).   Weight as of this encounter: 106.4 kg.     DVT prophylaxis:  SCDs Code Status: Full code Family Communication: Grandson at bedside Disposition Plan:    Status is: Inpatient Remains inpatient appropriate because: Medically Clear, Awaiting SNF placement.   Consultants:  Nephrology General Surgery  IR Gastroenterology  Cardiology    Procedures: CRRT Antimicrobials: Anti-infectives (From admission, onward)    Start     Dose/Rate Route Frequency Ordered Stop   12/07/23 1000  remdesivir 100 mg in sodium chloride 0.9 % 100 mL IVPB       Placed in "Followed by" Linked Group   100 mg 200 mL/hr over 30 Minutes Intravenous Daily 12/06/23 2239 12/08/23 0947   12/06/23 2245  remdesivir 200 mg in sodium chloride 0.9% 250 mL IVPB       Placed in "Followed by" Linked Group   200 mg 580 mL/hr over 30 Minutes Intravenous Once 12/06/23 2239 12/07/23 0142   12/06/23 1815  cefTRIAXone (ROCEPHIN) 2 g in sodium chloride 0.9 % 100 mL IVPB        2 g 200 mL/hr over 30 Minutes Intravenous Once 12/06/23 1812 12/06/23 1908   12/06/23 1815  azithromycin (ZITHROMAX) 500 mg in sodium chloride 0.9 % 250 mL IVPB        500 mg 250 mL/hr over 60 Minutes Intravenous  Once 12/06/23 1812 12/06/23 2153      Subjective: Patient was seen and examined at bedside. Overnight events noted.   Patient reports feeling much improved. Pain in the right upper quadrant improved but happens with coughing He is awaiting SNF placement.  Objective: Vitals:   12/25/23 0752 12/25/23 1552 12/25/23 1945 12/26/23  0806  BP: (!) 146/72 101/64 (!) 142/70 117/66  Pulse: 77 64 68 67  Resp: 17 17 18    Temp:   98.9 F (37.2 C) 97.9 F (36.6 C)  TempSrc:   Oral Oral  SpO2: 93% 100% 96% 100%  Weight:      Height:        Intake/Output Summary (Last 24 hours) at 12/26/2023 1109 Last data filed at 12/26/2023 0751 Gross per 24 hour  Intake 870 ml  Output 1000 ml  Net -130 ml   Filed Weights   12/23/23 0515 12/24/23 0400 12/25/23 0500  Weight: 106.2 kg 106.4 kg 105.5 kg     Examination:  General exam: Appears calm and comfortable, deconditioned, not in any acute distress. Respiratory system: CTA bilaterally. Respiratory effort normal.  RR 14 Cardiovascular system: S1 & S2 heard, RRR. No JVD, murmurs, rubs, gallops or clicks.  Gastrointestinal system: Abdomen is nondistended, soft and nontender. Normal bowel sounds heard. Central nervous system: Alert and oriented x 3. No focal neurological deficits. 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 Skin: No rashes, lesions or ulcers Psychiatry: Judgement and insight appear normal. Mood & affect appropriate.     Data Reviewed: I have personally reviewed following labs and imaging studies  CBC: Recent Labs  Lab 12/21/23 0635 12/22/23 0753 12/23/23 0649 12/24/23 1825 12/25/23 0602 12/26/23 0557  WBC 10.1 8.7 8.8  --  8.4 6.4  NEUTROABS  --   --   --   --  6.1  --   HGB 11.0* 10.5* 10.4* 10.5* 9.5* 9.9*  HCT 34.8* 32.8* 32.7* 33.0* 29.8* 31.1*  MCV 89.2 88.4 88.9  --  89.8 90.7  PLT 162 149* 156  --  150 164   Basic Metabolic Panel: Recent Labs  Lab 12/21/23 0635 12/22/23 0753 12/23/23 0649 12/24/23 0826 12/25/23 0602 12/25/23 0903 12/26/23 0557  NA 131* 129* 132* 132* 130*  --  131*  K 4.9 4.5 4.8 5.0 5.3* 5.4* 4.9  CL 91* 92* 91* 93* 91*  --  90*  CO2 30 30 32 30 30  --  33*  GLUCOSE 205* 270* 202* 307* 254*  --  249*  BUN 49* 45* 47* 47* 54*  --  54*  CREATININE 1.53* 1.28* 1.58* 1.38* 1.61*  --  1.49*  CALCIUM 8.4* 8.4* 8.8* 8.7* 8.8*  --  9.1  MG 1.6* 1.9  --   --   --   --  1.6*  PHOS 3.8 2.8  --   --   --   --  5.1*   GFR: Estimated Creatinine Clearance: 48 mL/min (A) (by C-G formula based on SCr of 1.49 mg/dL (H)). Liver Function Tests: Recent Labs  Lab 12/20/23 0637  AST 26  ALT 22  ALKPHOS 97  BILITOT 1.6*  PROT 6.2*  ALBUMIN 2.5*   No results for input(s): "LIPASE", "AMYLASE" in the last 168 hours. No results for input(s): "AMMONIA" in the last 168  hours. Coagulation Profile: No results for input(s): "INR", "PROTIME" in the last 168 hours. Cardiac Enzymes: No results for input(s): "CKTOTAL", "CKMB", "CKMBINDEX", "TROPONINI" in the last 168 hours. BNP (last 3 results) No results for input(s): "PROBNP" in the last 8760 hours. HbA1C: No results for input(s): "HGBA1C" in the last 72 hours. CBG: Recent Labs  Lab 12/25/23 0751 12/25/23 1314 12/25/23 1550 12/25/23 2031 12/26/23 0756  GLUCAP 267* 324* 248* 176* 263*   Lipid Profile: No results for input(s): "CHOL", "HDL", "LDLCALC", "TRIG", "CHOLHDL", "LDLDIRECT" in  the last 72 hours. Thyroid Function Tests: No results for input(s): "TSH", "T4TOTAL", "FREET4", "T3FREE", "THYROIDAB" in the last 72 hours. Anemia Panel: No results for input(s): "VITAMINB12", "FOLATE", "FERRITIN", "TIBC", "IRON", "RETICCTPCT" in the last 72 hours. Sepsis Labs: No results for input(s): "PROCALCITON", "LATICACIDVEN" in the last 168 hours.  No results found for this or any previous visit (from the past 240 hours).   Radiology Studies: CT ABDOMEN PELVIS WO CONTRAST Result Date: 12/24/2023 CLINICAL DATA:  Retroperitoneal hematoma, progressive right flank pain EXAM: CT ABDOMEN AND PELVIS WITHOUT CONTRAST TECHNIQUE: Multidetector CT imaging of the abdomen and pelvis was performed following the standard protocol without IV contrast. RADIATION DOSE REDUCTION: This exam was performed according to the departmental dose-optimization program which includes automated exposure control, adjustment of the mA and/or kV according to patient size and/or use of iterative reconstruction technique. 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 This is not well assessed on  this examination. Previously noted pleural effusions have resolved. Status post transcatheter aortic valve replacement. Coronary artery bypass grafting has been performed. Cardiac size is at the upper limits of normal. Hepatobiliary: No focal liver abnormality is seen. No gallstones, gallbladder wall thickening, or biliary dilatation. Pancreas: Unremarkable Spleen: Unremarkable Adrenals/Urinary Tract: The adrenal glands are unremarkable. The right kidney is displaced anteriorly by the retroperitoneal hematoma, however, appears unchanged. The kidneys are otherwise unremarkable on this noncontrast examination. Previously noted Foley catheter has been removed. Punctate focus of nondependent gas within the bladder lumen likely relates recent catheterization. The bladder is otherwise unremarkable. Stomach/Bowel: Stomach is within normal limits. Appendix appears normal. No evidence of bowel wall thickening, distention, or inflammatory changes. Vascular/Lymphatic: Aortic atherosclerosis. No enlarged abdominal or pelvic lymph nodes. Reproductive: Prostate is unremarkable. Other: Right retroperitoneal hematoma is unchanged, measuring roughly 11.2 x 6.0 x 17.0 cm in greatest dimension, arising from the right psoas. No abdominal wall hernia. Dependent subcutaneous body wall edema is unchanged. Musculoskeletal: Osseous structures are age-appropriate. L3 and L4 posterior decompression has been performed. Bilateral L5 pars defects are present without associated spondylolisthesis. No acute bone abnormality. IMPRESSION: 1. No significant interval change in right retroperitoneal hematoma, arising from the right psoas. 2. Interval resolution of previously noted pleural effusions. 3. 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 Aortic Atherosclerosis (ICD10-I70.0). Electronically Signed   By: Helyn Numbers M.D.   On: 12/24/2023 20:05   Scheduled Meds:  atorvastatin  80 mg Oral Daily    Chlorhexidine Gluconate Cloth  6 each Topical Daily   vitamin B-12  1,000 mcg Oral Daily   docusate sodium  100 mg Oral BID   escitalopram  10 mg Oral Daily   feeding supplement (NEPRO CARB STEADY)  237 mL Oral BID BM   furosemide  40 mg Oral BID   gabapentin  300 mg Oral Daily   Gerhardt's butt cream   Topical BID   insulin aspart  0-20 Units Subcutaneous TID WC   insulin aspart  0-5 Units Subcutaneous QHS   insulin aspart  8 Units Subcutaneous TID WC   insulin glargine-yfgn  36 Units Subcutaneous Daily   metoprolol tartrate  25 mg Oral Daily   multivitamin  1 tablet Oral QHS   pantoprazole  40 mg Oral Daily   polyethylene glycol  17 g Oral TID BM  traZODone  75 mg Oral QHS   zinc sulfate (50mg  elemental zinc)  220 mg Oral Daily   Continuous Infusions:   LOS: 20 days    Time spent: 35 mins    Willeen Niece, MD [facility]   If 7PM-7AM, please contact night-coverage

## 2023-12-26 NOTE — Progress Notes (Signed)
Mobility Specialist: Progress Note   12/26/23 1144  Mobility  Activity Ambulated with assistance in room;Stood at bedside  Level of Assistance Minimal assist, patient does 75% or more  Assistive Device Front wheel walker  Distance Ambulated (ft) 4 ft  Activity Response Tolerated well  Mobility Referral Yes  Mobility visit 1 Mobility  Mobility Specialist Start Time (ACUTE ONLY) 0955  Mobility Specialist Stop Time (ACUTE ONLY) 1020  Mobility Specialist Time Calculation (min) (ACUTE ONLY) 25 min    Pt was agreeable to mobility session - received in bed. MS donned/doffed RLE prosthesis. MinA for STS and ambulation. Able to take 2 steps forward and backward and then take a seated break EOB before taking 4 side steps. C/o LLE pain and weakness and feeling winded. Also mhad issue with boot on RLE prosthesis getting caught on the side railing of the bed. Left in bed with all needs met, call bell in reach.   Maurene Capes Mobility Specialist Please contact via SecureChat or Rehab office at [phone]

## 2023-12-27 ENCOUNTER — Telehealth: Payer: Self-pay | Admitting: Orthopedic Surgery

## 2023-12-27 DIAGNOSIS — L97521 Non-pressure chronic ulcer of other part of left foot limited to breakdown of skin: Secondary | ICD-10-CM

## 2023-12-27 DIAGNOSIS — N179 Acute kidney failure, unspecified: Secondary | ICD-10-CM | POA: Diagnosis not present

## 2023-12-27 LAB — RENAL FUNCTION PANEL
Albumin: 2.3 g/dL — ABNORMAL LOW (ref 3.5–5.0)
Anion gap: 7 (ref 5–15)
BUN: 57 mg/dL — ABNORMAL HIGH (ref 8–23)
CO2: 33 mmol/L — ABNORMAL HIGH (ref 22–32)
Calcium: 8.9 mg/dL (ref 8.9–10.3)
Chloride: 90 mmol/L — ABNORMAL LOW (ref 98–111)
Creatinine, Ser: 1.34 mg/dL — ABNORMAL HIGH (ref 0.61–1.24)
GFR, Estimated: 53 mL/min — ABNORMAL LOW (ref >60)
Glucose, Bld: 239 mg/dL — ABNORMAL HIGH (ref 70–99)
Phosphorus: 4.4 mg/dL (ref 2.5–4.6)
Potassium: 4.4 mmol/L (ref 3.5–5.1)
Sodium: 130 mmol/L — ABNORMAL LOW (ref 135–145)

## 2023-12-27 LAB — BASIC METABOLIC PANEL
Anion gap: 8 (ref 5–15)
BUN: 55 mg/dL — ABNORMAL HIGH (ref 8–23)
CO2: 32 mmol/L (ref 22–32)
Calcium: 8.9 mg/dL (ref 8.9–10.3)
Chloride: 88 mmol/L — ABNORMAL LOW (ref 98–111)
Creatinine, Ser: 1.39 mg/dL — ABNORMAL HIGH (ref 0.61–1.24)
GFR, Estimated: 51 mL/min — ABNORMAL LOW (ref >60)
Glucose, Bld: 218 mg/dL — ABNORMAL HIGH (ref 70–99)
Potassium: 5 mmol/L (ref 3.5–5.1)
Sodium: 128 mmol/L — ABNORMAL LOW (ref 135–145)

## 2023-12-27 LAB — CBC
HCT: 30.4 % — ABNORMAL LOW (ref 39.0–52.0)
Hemoglobin: 9.8 g/dL — ABNORMAL LOW (ref 13.0–17.0)
MCH: 29.2 pg (ref 26.0–34.0)
MCHC: 32.2 g/dL (ref 30.0–36.0)
MCV: 90.5 fL (ref 80.0–100.0)
Platelets: 177 10*3/uL (ref 150–400)
RBC: 3.36 MIL/uL — ABNORMAL LOW (ref 4.22–5.81)
RDW: 16.6 % — ABNORMAL HIGH (ref 11.5–15.5)
WBC: 6 10*3/uL (ref 4.0–10.5)
nRBC: 0 % (ref 0.0–0.2)

## 2023-12-27 LAB — GLUCOSE, CAPILLARY
Glucose-Capillary: 193 mg/dL — ABNORMAL HIGH (ref 70–99)
Glucose-Capillary: 205 mg/dL — ABNORMAL HIGH (ref 70–99)
Glucose-Capillary: 216 mg/dL — ABNORMAL HIGH (ref 70–99)
Glucose-Capillary: 224 mg/dL — ABNORMAL HIGH (ref 70–99)
Glucose-Capillary: 267 mg/dL — ABNORMAL HIGH (ref 70–99)

## 2023-12-27 LAB — PHOSPHORUS: Phosphorus: 5.1 mg/dL — ABNORMAL HIGH (ref 2.5–4.6)

## 2023-12-27 LAB — MAGNESIUM: Magnesium: 1.9 mg/dL (ref 1.7–2.4)

## 2023-12-27 MED ORDER — GUAIFENESIN 100 MG/5ML PO LIQD
10.0000 mL | Freq: Four times a day (QID) | ORAL | Status: DC
  Administered 2023-12-27 – 2023-12-30 (×12): 10 mL via ORAL
  Filled 2023-12-27 (×12): qty 15

## 2023-12-27 MED ORDER — BENZONATATE 100 MG PO CAPS
100.0000 mg | ORAL_CAPSULE | Freq: Three times a day (TID) | ORAL | Status: DC
  Administered 2023-12-27 – 2023-12-30 (×9): 100 mg via ORAL
  Filled 2023-12-27 (×9): qty 1

## 2023-12-27 MED ORDER — LIDOCAINE 5 % EX PTCH
1.0000 | MEDICATED_PATCH | CUTANEOUS | Status: DC
  Administered 2023-12-27 – 2023-12-29 (×3): 1 via TRANSDERMAL
  Filled 2023-12-27 (×3): qty 1

## 2023-12-27 MED ORDER — SODIUM ZIRCONIUM CYCLOSILICATE 10 G PO PACK
10.0000 g | PACK | Freq: Every day | ORAL | Status: DC
  Administered 2023-12-27 – 2023-12-28 (×2): 10 g via ORAL
  Filled 2023-12-27 (×2): qty 1

## 2023-12-27 MED ORDER — INSULIN ASPART 100 UNIT/ML IJ SOLN
10.0000 [IU] | Freq: Three times a day (TID) | INTRAMUSCULAR | Status: DC
  Administered 2023-12-27 – 2023-12-29 (×5): 10 [IU] via SUBCUTANEOUS

## 2023-12-27 MED ORDER — METHOCARBAMOL 500 MG PO TABS
500.0000 mg | ORAL_TABLET | Freq: Three times a day (TID) | ORAL | Status: DC
  Administered 2023-12-27 – 2023-12-28 (×4): 500 mg via ORAL
  Filled 2023-12-27 (×4): qty 1

## 2023-12-27 MED ORDER — INSULIN ASPART 100 UNIT/ML IJ SOLN
0.0000 [IU] | Freq: Three times a day (TID) | INTRAMUSCULAR | Status: DC
  Administered 2023-12-27 – 2023-12-28 (×3): 5 [IU] via SUBCUTANEOUS
  Administered 2023-12-28: 8 [IU] via SUBCUTANEOUS
  Administered 2023-12-29 (×2): 3 [IU] via SUBCUTANEOUS
  Administered 2023-12-29: 5 [IU] via SUBCUTANEOUS
  Administered 2023-12-30: 2 [IU] via SUBCUTANEOUS
  Administered 2023-12-30: 3 [IU] via SUBCUTANEOUS

## 2023-12-27 MED ORDER — INSULIN ASPART 100 UNIT/ML IJ SOLN
0.0000 [IU] | Freq: Every day | INTRAMUSCULAR | Status: DC
  Administered 2023-12-27 – 2023-12-28 (×2): 3 [IU] via SUBCUTANEOUS

## 2023-12-27 MED ORDER — DEXTROMETHORPHAN POLISTIREX ER 30 MG/5ML PO SUER
15.0000 mg | Freq: Two times a day (BID) | ORAL | Status: DC
  Administered 2023-12-27 – 2023-12-30 (×7): 15 mg via ORAL
  Filled 2023-12-27 (×8): qty 5

## 2023-12-27 NOTE — Telephone Encounter (Signed)
This pt has a hx of BKA on right and is now in the hospital with a wound on the left foot ( phot in chart) she is asking if you can come and see the pt while he is in the hospital. They have stopped the nitroglycerine patch to the foot. Pt was in the ICU for several days original admission for covid and kidney failure

## 2023-12-27 NOTE — Progress Notes (Signed)
Physical Therapy Treatment Patient Details Name: Shane [name] MRN: [id number] DOB: [birth year] Today's Date: 12/27/2023   History of Present Illness 83 year old male presented to ED 1/1 with shortness of breath.  Dx: Covid 19 PNA, Acute kidney injury, Acute hypoxic respiratory failure, Normocytic Anemia - secondary to RP bleed and critical illness, Acute HF with reduced EF, s/p CRRT. With significant past medical history including obstructive sleep apnea on CPAP at night, CAD s/p CABG x3 in 2021, aortic stenosis s/p TAVR 08/2023 at [facility], HFpEF, atrial fibrillation on Eliquis, plavix, sick sinus syndrome s/p leadless PPM, hypertension, hyperlipidemia, GERD, uncontrolled diabetes, CKD 3, pulmonary fibrosis    PT Comments  Pt resting in bed on arrival, agreeable to bed level exercises as pt stating he had received pain medication and was feeling fatigued, despite encouragement for EOB and OOB mobility. Pt with fair tolerance for LE exercises to maintain strength with cues for technique and hands on assist for increased ROM. Encouraged pt to complete throughout day with pt verbalizing understanding. Pt with shrinker donned on R on arrival and continued education on importance of continued wear. Pt continues to benefit from skilled PT services to progress toward functional mobility goals.     If plan is discharge home, recommend the following: A lot of help with walking and/or transfers;A lot of help with bathing/dressing/bathroom;Assistance with cooking/housework;Assist for transportation;Help with stairs or ramp for entrance   Can travel by private vehicle     No  Equipment Recommendations   (defer to post-acute)    Recommendations for Other Services       Precautions / Restrictions Precautions Precautions: Fall Precaution Comments: Lt distal-lateral foot wound. 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 Wears 2 L O2 at night Required Braces or Orthoses: Other Brace Other Brace: RLE prosthesis. Needs  to continue to wear shrinker Restrictions Weight Bearing Restrictions Per Provider Order: No     Mobility  Bed Mobility Overal bed mobility: Needs Assistance Bed Mobility: Rolling Rolling: Modified independent (Device/Increase time)         General bed mobility comments: use of bedrails    Transfers                   General transfer comment: declining    Ambulation/Gait                   Stairs             Wheelchair Mobility     Tilt Bed    Modified Rankin (Stroke Patients Only)       Balance                                            Cognition Arousal: Alert Behavior During Therapy: WFL for tasks assessed/performed Overall Cognitive Status: Within Functional Limits for tasks assessed                                 General Comments: Slight delay with responses at times.        Exercises General Exercises - Lower Extremity Ankle Circles/Pumps: AROM, Left, 10 reps, Supine Quad Sets: Strengthening, Both, 10 reps, Supine Short Arc Quad: Strengthening, Left, 10 reps, Supine Hip ABduction/ADduction: Strengthening, Both, 10 reps, Supine    General Comments        Pertinent Vitals/Pain  Pain Assessment Pain Assessment: Faces Faces Pain Scale: Hurts little more Pain Location: L knee Pain Descriptors / Indicators: Sore Pain Intervention(s): Premedicated before session, Monitored during session, Limited activity within patient's tolerance    Home Living                          Prior Function            PT Goals (current goals can now be found in the care plan section) Acute Rehab PT Goals Patient Stated Goal: Get well, go home PT Goal Formulation: With patient Time For Goal Achievement: 01/08/24 Progress towards PT goals: Not progressing toward goals - comment (increased fatigue this date)    Frequency    Min 1X/week      PT Plan      Co-evaluation               AM-PAC PT "6 Clicks" Mobility   Outcome Measure  Help needed turning from your back to your side while in a flat bed without using bedrails?: A Little Help needed moving from lying on your back to sitting on the side of a flat bed without using bedrails?: A Little Help needed moving to and from a bed to a chair (including a wheelchair)?: A Lot Help needed standing up from a chair using your arms (e.g., wheelchair or bedside chair)?: A Lot Help needed to walk in hospital room?: Total Help needed climbing 3-5 steps with a railing? : Total 6 Click Score: 12    End of Session   Activity Tolerance: Patient limited by pain;Patient limited by fatigue Patient left: in bed;with call bell/phone within reach;with bed alarm set Nurse Communication: Mobility status PT Visit Diagnosis: Muscle weakness (generalized) (M62.81);Difficulty in walking, not elsewhere classified (R26.2);Pain Pain - Right/Left: Right Pain - part of body:  (flank and abdomen)     Time: 1610-9604 PT Time Calculation (min) (ACUTE ONLY): 9 min  Charges:    $Therapeutic Exercise: 8-22 mins PT General Charges $$ ACUTE PT VISIT: 1 Visit                     Cathie Bonnell R. PTA Acute Rehabilitation Services Office: [phone]   Catalina Antigua 12/27/2023, 4:44 PM

## 2023-12-27 NOTE — Progress Notes (Signed)
Introduced flutter valve to the patient

## 2023-12-27 NOTE — Plan of Care (Signed)
  Problem: Education: Goal: Knowledge of risk factors and measures for prevention of condition will improve Outcome: Progressing   Problem: Coping: Goal: Psychosocial and spiritual needs will be supported Outcome: Progressing   Problem: Fluid Volume: Goal: Ability to maintain a balanced intake and output will improve Outcome: Progressing   Problem: Health Behavior/Discharge Planning: Goal: Ability to identify and utilize available resources and services will improve Outcome: Progressing   Problem: Health Behavior/Discharge Planning: Goal: Ability to manage health-related needs will improve Outcome: Progressing   Problem: Skin Integrity: Goal: Risk for impaired skin integrity will decrease Outcome: Progressing   Problem: Education: Goal: Knowledge of General Education information will improve Description: Including pain rating scale, medication(s)/side effects and non-pharmacologic comfort measures Outcome: Progressing   Problem: Health Behavior/Discharge Planning: Goal: Ability to manage health-related needs will improve Outcome: Progressing   Problem: Activity: Goal: Risk for activity intolerance will decrease Outcome: Progressing   Problem: Coping: Goal: Level of anxiety will decrease Outcome: Progressing   Problem: Safety: Goal: Ability to remain free from injury will improve Outcome: Progressing   Problem: Skin Integrity: Goal: Risk for impaired skin integrity will decrease Outcome: Progressing

## 2023-12-27 NOTE — Progress Notes (Signed)
[facility] Progress Note Patient: Shane [name] ZHY:[id number] DOB: [birth year] DOA: 12/06/2023  DOS: the patient was seen and examined on 12/27/2023  Brief Hospital Course: his 83 year old male with significant past medical history including obstructive sleep apnea on CPAP at night, CAD s/p CABG x3 in 2021, aortic stenosis s/p TAVR 08/2023 at [facility], HFpEF, atrial fibrillation on Eliquis, plavix, sick sinus syndrome s/p leadless PPM, hypertension, hyperlipidemia, GERD, uncontrolled diabetes, CKD 3, pulmonary fibrosis followed by Dr. Marchelle Gearing, who presented to the emergency department on 12/06/2023 with shortness of breath.  EMS was called and found to be hypotensive and hypoxic. He was started on nonrebreather and an epinephrine infusion prior to arrival. In the ED afebrile, SBP around 100, tachypneic. Labs +COVID, AKI on CKD with BUN/Cr 116, 4.69 with critical K of 7.1. bicarb 16. CBC without leukocytosis, hgb 9.5. lactic 2.3>1.9, troponin 8,290, UA negative, blood cultures pending. CXR with likely pulmonary edema. With new AKI, hyperkalemia and EKG changes, nephrology was consulted.  He was admitted by CCM for covid pneumonia and AKI, hyperkalemia subsequently underwent CRRT. Subsequently his renal parameters improved. Cardiology also consulted   for NSTEMI, recommended medical management.    Assessment & Plan: Acute on chronic hypoxic respiratory failure, multifactorial: Likely due to COVID+, pulmonary edema due to acute systolic CHF, underlying pulmonary fibrosis. At baseline patient uses 2 L of supplemental oxygen via [place]. Patient presented with cough, shortness of breath x 3 days with no improvement on home nebulizers. Patient was found to be severely hypoxic requiring nonrebreather on arrival. Patient states his wife and grandson had been sick with COVID infection recently. SARS coronavirus 2 PCR positive, influenza A and B by PCR negative. Initial chest x-ray findings concerning for CHF  exacerbation. Patient has completed treatment for COVID with remdesivir and Decadron. Patient has undergone brief CRRT during this hospitalization. Patient with clinical improvement. HD tunneled cath removed per Nephro Continue Lasix 40 mg p.o. twice daily Patient has completed over 10 days of isolation.  Therapy eval recommending SNF as CIR was denied.  Patient wants to go home.   NSTEMI : Secondary to demand ischemia in the setting of COVID-19 infection with underlying cardiomyopathy. Patient denies any chest pain. Echo showed LVEF 40%,WMA, status post bioprosthetic aortic valve /status post TAVR. Cardiology recommended medical management. Heparin discontinued due to retroperitoneal bleeding.   Acute retroperitoneal bleed : CTA/P showed retroperitoneal bleeding. Status post transfusion of 1 unit platelets, 2 units PRBCs, thiamine, DDAVP. Anticoagulation for thromboembolic prophylaxis given A-fib on hold due to risk of bleeding. Due to presentation with NSTEMI and cardiac history patient was transfused 2 units PRBCs on 12/12/2023.  Repeat CT A/P (12/12/2023 ), with redemonstrated large right retroperitoneal hematoma with hematocrit level.  Patient reports worsening pain on coughing or deep breathing on the RUQ , concerned for worsening retroperitoneal bleed.  Repeat CT A/P: No significant interval change in right retroperitoneal hematoma. H/H remains stable. Continue pain control.  Continue cough control as well.  Paroxysmal atrial fibrillation: Rate controlled.  Anticoagulation on hold sec to retroperitoneal bleed.  Recommend outpatient follow up with cardiology to see if he is a candidate for restarting anti coagulation.     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 Follow up outpatient.   Hyperlipidemia Continue Lipitor 80 mg daily.   CAD s/p CABG s/p TAVR Aspirin, and heparin discontinued.     Acute kidney injury: CRRT on 1/2 to  1/3.  Creatinine fluctuating.  Potassium level elevated.  Initiating [place]  again.   Left chronic foot wound: Wound On the plantar aspect. Wound care on board.  Wife at bedside .    Acute blood loss anemia/ Normocytic anemia/ Anemia of chronic disease: Hemoglobin stable around 10.    Hyponatremia: Improved.    Hyperkalemia : Started on [place] again.  AG metabolic acidosis Resolved.    Shock resolved.  Not largely cardiogenic but cannot rule out a septic component.. Status post dose of antibiotic coverage for community-acquired pneumonia and status post treatment for COVID infection. Shock resolved.   Dysphagia Recommend outpatient follow up with GI    Type 2 DM uncontrolled with hyperglycemia.  Increase the Semglee to 36 units , Continue with novolog 4 units TIDAC AND SSI.   Subjective: No nausea no vomiting no fever.  Has cough and severe pain in his right lower quadrant after cough.  Physical Exam: General: in Mild distress, No Rash Cardiovascular: S1 and S2 Present, No Murmur Respiratory: Good respiratory effort, Bilateral Air entry present. No Crackles, No wheezes Abdomen: Bowel Sound present, right lower quadrant tenderness Extremities: No edema Neuro: Alert and oriented x3, no new focal deficit  Data Reviewed: I have Reviewed nursing notes, Vitals, and Lab results. Since last encounter, pertinent lab results CBC and BMP   . I have ordered test including CBC and BMP  .   Disposition: Status is: Inpatient Remains inpatient appropriate because: Monitor for improvement in sodium potassium and pain control  SCDs Start: 12/06/23 2212   Family Communication: No one at bedside Level of care: Telemetry Medical continue Vitals:   12/27/23 0814 12/27/23 0817 12/27/23 1216 12/27/23 1727  BP: (!) 153/110 (!) 142/56 123/60 (!) 118/50  Pulse:  69 60 63  Resp:      Temp:      TempSrc:      SpO2:  100% 100% 100%  Weight:      Height:         Author: Lynden Oxford, MD 12/27/2023 6:26 PM  Please look on [email or web address] to find out who is on call.

## 2023-12-27 NOTE — Plan of Care (Signed)
  Problem: Education: Goal: Knowledge of risk factors and measures for prevention of condition will improve Outcome: Progressing   Problem: Coping: Goal: Psychosocial and spiritual needs will be supported Outcome: Progressing   Problem: Respiratory: Goal: Will maintain a patent airway Outcome: Progressing Goal: Complications related to the disease process, condition or treatment will be avoided or minimized Outcome: Progressing   Problem: Education: Goal: Ability to describe self-care measures that may prevent or decrease complications (Diabetes Survival Skills Education) will improve Outcome: Progressing Goal: Individualized Educational Video(s) Outcome: Progressing   Problem: Coping: Goal: Ability to adjust to condition or change in health will improve Outcome: Progressing   Problem: Fluid Volume: Goal: Ability to maintain a balanced intake and output will improve Outcome: Progressing   Problem: Health Behavior/Discharge Planning: Goal: Ability to identify and utilize available resources and services will improve Outcome: Progressing Goal: Ability to manage health-related needs will improve Outcome: Progressing   Problem: Metabolic: Goal: Ability to maintain appropriate glucose levels will improve Outcome: Progressing   Problem: Nutritional: Goal: Maintenance of adequate nutrition will improve Outcome: Progressing Goal: Progress toward achieving an optimal weight will improve Outcome: Progressing   Problem: Skin Integrity: Goal: Risk for impaired skin integrity will decrease Outcome: Progressing   Problem: Tissue Perfusion: Goal: Adequacy of tissue perfusion will improve Outcome: Progressing   Problem: Education: Goal: Knowledge of General Education information will improve Description: Including pain rating scale, medication(s)/side effects and non-pharmacologic comfort measures Outcome: Progressing   Problem: Health Behavior/Discharge Planning: Goal:  Ability to manage health-related needs will improve Outcome: Progressing   Problem: Clinical Measurements: Goal: Ability to maintain clinical measurements within normal limits will improve Outcome: Progressing Goal: Will remain free from infection Outcome: Progressing Goal: Diagnostic test results will improve Outcome: Progressing Goal: Respiratory complications will improve Outcome: Progressing Goal: Cardiovascular complication will be avoided Outcome: Progressing   Problem: Activity: Goal: Risk for activity intolerance will decrease Outcome: Progressing   Problem: Coping: Goal: Level of anxiety will decrease Outcome: Progressing   Problem: Nutrition: Goal: Adequate nutrition will be maintained Outcome: Progressing   Problem: Elimination: Goal: Will not experience complications related to bowel motility Outcome: Progressing Goal: Will not experience complications related to urinary retention Outcome: Progressing   Problem: Pain Management: Goal: General experience of comfort will improve Outcome: Progressing   Problem: Skin Integrity: Goal: Risk for impaired skin integrity will decrease Outcome: Progressing

## 2023-12-27 NOTE — Progress Notes (Signed)
Mobility Specialist: Progress Note   12/27/23 1156  Mobility  Activity Dangled on edge of bed  Range of Motion/Exercises Active;Left leg;Right leg  Activity Response Tolerated well  Mobility Referral Yes  Mobility visit 1 Mobility  Mobility Specialist Start Time (ACUTE ONLY) 0947  Mobility Specialist Stop Time (ACUTE ONLY) 1004  Mobility Specialist Time Calculation (min) (ACUTE ONLY) 17 min    Pt was agreeable to mobility session - received in bed. C/o knee soreness and pain. Declined OOB mobility d/t L knee pain and stated he didn't think he could stand on that leg, but pt agreed to perform EOB exercise. Completed seated marches, L ankle pumps, and knee kicks. Left in bed with all needs met, call bell in reach.   Maurene Capes Mobility Specialist Please contact via SecureChat or Rehab office at [phone]

## 2023-12-27 NOTE — Consult Note (Signed)
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  HPI: Shane [name] is a 83 y.o. male who presents with COVID and renal failure.  Patient has had a ulcer on the left transmetatarsal amputation.  Past Medical History:  Diagnosis Date   Acute blood loss anemia    Anxiety    AV block, [place] 1    Cataract    Chronic kidney disease    d/t DM   CKD (chronic kidney disease), stage III (HCC)    COVID-19 virus infection 07/18/2020   Last Assessment & Plan:   Formatting of this note might be different from the original.  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 10/2019 Echo: EF 55-60%, Gr2 DD. Nl RV.    Neuromuscular disorder (HCC)    Neuropathy associated with endocrine disorder (HCC)    Nonobstructive CAD (coronary artery disease)    a. 2012 Cath: mod, nonobs dzs; b. 10/2016 MV: EF 60%, no ischemia.   OSA on CPAP 05/05/2017   Unattended Home Sleep Test 7/2/813-AHI 38.6/hour, desaturation to 64%, body weight 261 pounds   PONV (postoperative nausea and vomiting)    Postoperative anemia due to acute blood loss 11/07/2020   Postoperative hemorrhagic shock 03/20/2021   Sleep apnea     uses cpap asked to bring mask and tubing   Past Surgical History:  Procedure Laterality Date   ABDOMINAL AORTOGRAM W/LOWER EXTREMITY N/A 12/10/2020   Procedure: ABDOMINAL AORTOGRAM W/LOWER EXTREMITY;  Surgeon: Leonie Douglas, MD;  Location: [facility]  INVASIVE CV LAB;  Service: Cardiovascular;  Laterality: N/A;   AMPUTATION Right 01/22/2021   Procedure: 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;  Surgeon: [name], MD;  Location: [facility] OR;  Service: Orthopedics;  Laterality: Right;   AMPUTATION Right 03/17/2021   Procedure: RIGHT BELOW KNEE AMPUTATION;  Surgeon: [name], MD;  Location[facility] OR;  Service: Orthopedics;  Laterality: Right;   ANKLE FUSION Right 01/22/2021   Procedure: RIGHT FOOT TIBIOCALCANEAL FUSION;  Surgeon: [name], MD;  Location: [facility] OR;  Service: Orthopedics;  Laterality: Right;   ANTERIOR FUSION CERVICAL SPINE  2012   CARDIAC CATHETERIZATION  2011   CARDIAC CATHETERIZATION N/A 11/09/2016   Procedure: Right Heart Cath;  Surgeon: Lyn Records, MD;  Location: [facility] INVASIVE CV LAB;  Service: Cardiovascular;  Laterality: N/A;   carpel tunnel     left wrist   CATARACT EXTRACTION     CATARACT EXTRACTION W/ INTRAOCULAR LENS  IMPLANT, BILATERAL  2013   CERVICAL LAMINECTOMY  2012   COLONOSCOPY N/A 01/14/2013   Procedure: COLONOSCOPY;  Surgeon: Hilarie Fredrickson, MD;  Location: [facility] ENDOSCOPY;  Service: Endoscopy;  Laterality: N/A;   CORONARY ARTERY BYPASS GRAFT  11/04/2020   LIMA-LAD, SVG-OM1, SVG-PDA (Dr Rochele Pages [facility]) dc 11/18/2020   EYE SURGERY     I & D EXTREMITY Right 02/19/2021   Procedure: RIGHT ANKLE  DEBRIDEMENT AND PLACEMENT ANTIBIOTIC BEADS;  Surgeon: [name], MD;  Location: [facility] OR;  Service: Orthopedics;  Laterality: Right;   KNEE SURGERY  1998   left   LEFT HEART CATH AND CORONARY ANGIOGRAPHY N/A 07/10/2020   Procedure: LEFT HEART CATH AND CORONARY ANGIOGRAPHY;  Surgeon: Tonny Bollman, MD;  Location: [facility] INVASIVE CV LAB;  Service: Cardiovascular;  Laterality: N/A;   LUMBAR LAMINECTOMY  2003   LUNG BIOPSY Left 12/26/2016   Procedure: LUNG BIOPSY;  Surgeon: Loreli Slot, MD;  Location: [facility] OR;  Service: Thoracic;  Laterality: Left;   PACEMAKER IMPLANT N/A 03/30/2020   Procedure: PACEMAKER IMPLANT;  Surgeon: Marinus Maw, MD;  Location: [facility] INVASIVE CV LAB;  Service: Cardiovascular;  Laterality: N/A;   PERIPHERAL VASCULAR INTERVENTION Right 12/10/2020   Procedure: PERIPHERAL VASCULAR INTERVENTION;  Surgeon: Leonie Douglas, MD;  Location: [facility] INVASIVE CV LAB;  Service: Cardiovascular;  Laterality: Right;  SFA   POSTERIOR FUSION CERVICAL SPINE  2012   PPM GENERATOR REMOVAL N/A 12/14/2020   Procedure: PPM GENERATOR REMOVAL;  Surgeon: Marinus Maw, MD;  Location: [facility] INVASIVE CV LAB;  Service: Cardiovascular;  Laterality: N/A;   TEE WITHOUT CARDIOVERSION N/A 12/11/2020   Procedure: TRANSESOPHAGEAL ECHOCARDIOGRAM (TEE);  Surgeon: Sande Rives, MD;  Location: [facility] ENDOSCOPY;  Service: Cardiovascular;  Laterality: N/A;   TRIGGER FINGER RELEASE  2011   4th finger left hand   VIDEO ASSISTED THORACOSCOPY Left 12/26/2016   Procedure: VIDEO ASSISTED THORACOSCOPY;  Surgeon: Loreli Slot, MD;  Location: [facility] OR;  Service: Thoracic;  Laterality: Left;   VIDEO BRONCHOSCOPY N/A 12/26/2016   Procedure: VIDEO BRONCHOSCOPY;  Surgeon: Loreli Slot, MD;  Location: [facility] OR;  Service: Thoracic;  Laterality: N/A;   Social History   Socioeconomic History   Marital status: Married    Spouse name: Not on file   Number of children: Not on file   Years of education: Not on file   Highest education level: Not on file  Occupational History   Not on file  Tobacco Use   Smoking status: Never   Smokeless tobacco: Never  Vaping Use   Vaping status: Never Used  Substance and Sexual Activity   Alcohol use: No   Drug use: No   Sexual activity: Not Currently  Other Topics Concern   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    Social Drivers of Corporate investment banker Strain: Low Risk  (08/22/2023)   Received from [facility]   Overall Financial Resource Strain (CARDIA)    Difficulty of Paying Living Expenses: Not hard at all  Food Insecurity: No Food  Insecurity (12/07/2023)   Hunger Vital Sign    Worried About Running Out of Food in the Last Year: Never true    Ran Out of Food in the Last Year: Never true  Transportation Needs: No Transportation Needs (12/07/2023)   PRAPARE - Administrator, Civil Service (Medical): No    Lack of Transportation (Non-Medical): No  Physical Activity: Not on file  Stress: Not on file  Social Connections: Moderately Isolated (12/07/2023)   Social Connection and Isolation Panel [NHANES]    Frequency of Communication with Friends and Family: More than three times a week    Frequency of Social Gatherings with Friends and Family: More than three times a week    Attends Religious Services: Never    Database administrator or  Organizations: No    Attends Engineer, structural: Never    Marital Status: Married   Family History  Problem Relation Age of Onset   Diabetes Mellitus II Mother    Emphysema Father 73   Heart attack Father    Colon cancer Neg Hx    Esophageal cancer Neg Hx    Rectal cancer Neg Hx    Stomach cancer Neg Hx    - negative except otherwise stated in the family history section Allergies  Allergen Reactions   Codeine Hives and Itching   Ofev [Nintedanib] Diarrhea    SEVERE DIARRHEA   Pirfenidone Diarrhea and Other (See Comments)    Esbriet (Pirfenidone) causes elevated LFTs. D/C on 06/14/17 and SEVERE DIARRHEA    Prior to Admission medications   Medication Sig Start Date End Date Taking? Authorizing Provider  albuterol (PROVENTIL) (2.5 MG/3ML) 0.083% nebulizer solution Take 2.5 mg by nebulization 2 (two) times daily as needed for wheezing or shortness of breath.   Yes [provider]  albuterol (VENTOLIN HFA) 108 (90 Base) MCG/ACT inhaler Inhale 2 puffs into the lungs every 4 (four) hours as needed for wheezing or shortness of breath. Patient taking differently: Inhale 3 puffs into the lungs every 4 (four) hours as needed for wheezing or shortness of breath.  04/12/21  Yes Angiulli, Mcarthur Rossetti, PA-C  apixaban (ELIQUIS) 5 MG TABS tablet Take 1 tablet (5 mg total) by mouth 2 (two) times daily. Patient taking differently: Take 2.5 mg by mouth 2 (two) times daily. 04/12/21  Yes Angiulli, Mcarthur Rossetti, PA-C  Ascorbic Acid (VITAMIN C WITH ROSE HIPS) 500 MG tablet Take 1 tablet (500 mg total) by mouth daily. 04/12/21  Yes Angiulli, Mcarthur Rossetti, PA-C  atorvastatin (LIPITOR) 80 MG tablet TAKE 1 TABLET BY MOUTH EVERY DAY Patient taking differently: Take 80 mg by mouth at bedtime. 07/25/22  Yes Lewayne Bunting, MD  clopidogrel (PLAVIX) 75 MG tablet Take 75 mg by mouth daily. 09/04/22  Yes [provider]  diphenhydrAMINE HCl, Sleep, (ZZZQUIL PO) Take 15 mLs by mouth at bedtime as needed (cough).   Yes [provider]  docusate sodium (COLACE) 100 MG capsule Take 1 capsule (100 mg total) by mouth daily. Patient taking differently: Take 100 mg by mouth as needed for mild constipation or moderate constipation. 04/13/21  Yes Angiulli, Mcarthur Rossetti, PA-C  escitalopram (LEXAPRO) 10 MG tablet Take 1 tablet (10 mg total) by mouth daily. 04/12/21  Yes Angiulli, Mcarthur Rossetti, PA-C  FARXIGA 10 MG TABS tablet Take 10 mg by mouth daily.   Yes [provider]  furosemide (LASIX) 20 MG tablet Take 3 tablets (60 mg total) by mouth daily. Patient taking differently: Take 20-40 mg by mouth See admin instructions. Take 2 tablets by mouth in the morning and 1 tablet at night 06/24/23 12/05/24 Yes Ezenduka, Monica Martinez, MD  HUMALOG KWIKPEN 100 UNIT/ML KwikPen Inject 10-15 Units into the skin 3 (three) times daily. Depending what his blood sugar reading is   Yes [provider]  HYDROMET 5-1.5 MG/5ML syrup Take 5 mLs by mouth as needed for cough. 05/29/23  Yes [provider]  lisinopril (ZESTRIL) 10 MG tablet Take 10 mg by mouth daily. 08/22/23  Yes [provider]  LORazepam (ATIVAN) 1 MG tablet Take 1 tablet (1 mg total) by mouth at bedtime. Patient taking  differently: Take 1 mg by mouth at bedtime as needed (sleep). 04/12/21  Yes Angiulli, Mcarthur Rossetti, PA-C  metoprolol tartrate (LOPRESSOR)  25 MG tablet Take 25 mg by mouth daily.   Yes [provider]  Multiple Vitamin (MULTIVITAMIN WITH MINERALS) TABS tablet Take 1 tablet by mouth daily.   Yes [provider]  mupirocin ointment (BACTROBAN) 2 % Apply 1 Application topically at bedtime. 06/06/23  Yes [provider]  nitroGLYCERIN (NITRODUR - DOSED IN MG/24 HR) 0.2 mg/hr patch Place 1 patch (0.2 mg total) onto the skin daily. 11/23/23  Yes [name], MD  Olopatadine HCl (PATADAY OP) Place 2 drops into both eyes daily as needed (allergies).   Yes [provider]  ondansetron (ZOFRAN) 4 MG tablet Take 4 mg by mouth as needed for nausea or vomiting. 06/15/23  Yes [provider]  pantoprazole (PROTONIX) 40 MG tablet Take 1 tablet (40 mg total) by mouth 2 (two) times daily. Patient taking differently: Take 40 mg by mouth daily. 04/12/21  Yes Angiulli, Mcarthur Rossetti, PA-C  polyethylene glycol powder (GLYCOLAX/MIRALAX) 17 GM/SCOOP powder Take 17 g by mouth every other day. 11/15/20  Yes [provider]  traZODone (DESYREL) 50 MG tablet Take 75 mg by mouth at bedtime as needed for sleep. 05/11/23  Yes [provider]  TRESIBA FLEXTOUCH 100 UNIT/ML FlexTouch Pen Inject 20 Units into the skin daily. 12/22/22  Yes [provider]  zinc sulfate 220 (50 Zn) MG capsule Take 1 capsule (220 mg total) by mouth daily. 04/12/21  Yes Angiulli, Mcarthur Rossetti, PA-C  gabapentin (NEURONTIN) 300 MG capsule TAKE 1 CAPSULE BY MOUTH THREE TIMES A DAY Patient taking differently: Take 300 mg by mouth 2 (two) times daily. 12/14/21   Adonis Huguenin, NP   No results found. - pertinent xrays, CT, MRI studies were reviewed and independently interpreted  Positive ROS: All other systems have been reviewed and were otherwise negative with the exception of those mentioned in the HPI and  as above.  Physical Exam: General: Alert, no acute distress Psychiatric: Patient is competent for consent with normal mood and affect Lymphatic: No axillary or cervical lymphadenopathy Cardiovascular: No pedal edema Respiratory: No cyanosis, no use of accessory musculature GI: No organomegaly, abdomen is soft and non-tender    Images:  @ENCIMAGES @  Labs:  Lab Results  Component Value Date   HGBA1C 8.5 (H) 12/06/2023   HGBA1C 9.0 (H) 06/24/2023   HGBA1C 7.8 (H) 02/15/2021   ESRSEDRATE 22 (H) 10/14/2016   CRP 9.0 (H) 12/08/2023   CRP 8.3 (H) 12/09/2020   CRP 4.1 (H) 07/17/2020   REPTSTATUS 12/11/2023 FINAL 12/06/2023   GRAMSTAIN  02/19/2021    RARE WBC PRESENT, PREDOMINANTLY PMN NO ORGANISMS SEEN    CULT  12/06/2023    NO GROWTH 5 DAYS Performed at [facility], [address], [place], [place] [address]    [facility] ESCHERICHIA COLI 02/19/2021    Lab Results  Component Value Date   ALBUMIN 2.5 (L) 12/20/2023   ALBUMIN 2.5 (L) 12/18/2023   ALBUMIN 2.4 (L) 12/17/2023        Latest Ref Rng & Units 12/27/2023    6:14 AM 12/26/2023    5:57 AM 12/25/2023    6:02 AM  CBC EXTENDED  WBC 4.0 - 10.5 K/uL 6.0  6.4  8.4   RBC 4.22 - 5.81 MIL/uL 3.36  3.43  3.32   Hemoglobin 13.0 - 17.0 g/dL 9.8  9.9  9.5   HCT 78.2 - 52.0 % 30.4  31.1  29.8   Platelets 150 - 400 K/uL 177  164  150  NEUT# 1.7 - 7.7 K/uL   6.1   Lymph# 0.7 - 4.0 K/uL   0.9     Neurologic: Patient does not have protective sensation bilateral lower extremities.   MUSCULOSKELETAL:   Skin: Examination there are no pressure ulcers on the left heel.  Patient does have a stable healthy granulating wound on the left transmetatarsal amputation.  There is healthy viable granulation tissue the wound is 2 cm in diameter.  There is no ascending cellulitis no drainage no exposed bone or tendon.  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  Plan: Plan:  Continue the silver alginate wound dressings.  I will follow-up in the office in 2 weeks.  Thank you for the consult and the opportunity to see Mr. Junie Bame, MD [facility] [phone] 4:02 PM

## 2023-12-27 NOTE — Inpatient Diabetes Management (Signed)
Inpatient Diabetes Program Recommendations  AACE/ADA: New Consensus Statement on Inpatient Glycemic Control (2015)  Target Ranges:  Prepandial:   less than 140 mg/dL      Peak postprandial:   less than 180 mg/dL (1-2 hours)      Critically ill patients:  140 - 180 mg/dL   Lab Results  Component Value Date   GLUCAP 205 (H) 12/27/2023   HGBA1C 8.5 (H) 12/06/2023    Latest Reference Range & Units 12/26/23 07:56 12/26/23 11:55 12/26/23 12:15 12/26/23 16:25 12/26/23 20:58 12/27/23 07:53  Glucose-Capillary 70 - 99 mg/dL 161 (H) 096 (H) 045 (H) 327 (H) 247 (H) 205 (H)  (H): Data is abnormally high  Diabetes history: DM 2 Outpatient Diabetes medications: Farxiga 10 mg Daily, Humalog 10-15 units tid, Tresiba 20 units Daily Current orders for Inpatient glycemic control:  Semglee 36 units Daily Novolog 8 units tid meal coverage Novolog 0-20 units tid, 0-5 units hs correction  Inpatient Diabetes Program Recommendations:   Please consider: -Increase Novolog meal coverage to 10 units tid -Decrease Novolog correction to 0-15 units tid, 0-5 units hs  Thank you, Darel Hong E. Olesya Wike, RN, MSN, CDCES  Diabetes Coordinator Inpatient Glycemic Control Team Team Pager [phone] (8am-5pm) 12/27/2023 10:21 AM

## 2023-12-27 NOTE — TOC Progression Note (Addendum)
Transition of Care [facility]) - Progression Note    Patient Details  Name: Shane [name] MRN: [id number] Date of Birth: 12-07-1940  Transition of Care [facility]) CM/SW Contact  Shane [name], [place] Phone Number: 12/27/2023, 4:37 PM  Clinical Narrative:     Update 12/29/23 Pt's authorization request approved for [employer]   Auth ID: [id number] Reference ID: [id number]  Approval Dates:  12/29/2023-01/02/2024  Pt possible DC Saturday due to some reported stomach pain, needs continued medical workup. [place] [facility] [place] stated that she could take pt over the weekend, requested DC summary by 12 if possible. Provider notified.   Update 12/28/23 1630 CSW was notified by PT that pt is definite for rehab and was informed he stated "he'd go anywhere." CSW contacted pt's wife Shane [name] who stated she was agreeable to plan and chose [facility] for rehab. Possible bed available tomorrow versus Saturday. CSW will follow up tomorrow to confirm. CSW started insurance authorization as pt is medically stable for DC. Auth status: Approved. Auth ID [id number] Approval Dates:  12/29/2023-01/02/2024.  CSW spoke with pt's wife Shane [name], CSW informed her that pt was unable to go to [facility] and did not provide bed offer. She said that pt was now agreeable to go home and does not want to go to other rehab facilities. CSW notified RN CM and working to assist pt home with home health and other personal care services if needed.  Estimated DC for tomorrow when pt is potentially medically stable.    TOC will continue to follow.        Expected Discharge Plan and Services                                               Social Determinants of Health (SDOH) Interventions SDOH Screenings   Food Insecurity: No Food Insecurity (12/07/2023)  Housing: Low Risk  (12/07/2023)  Transportation Needs: No Transportation Needs (12/07/2023)  Utilities: Not At Risk (12/07/2023)  Depression (PHQ2-9): Low Risk  (01/11/2021)   Financial Resource Strain: Low Risk  (08/22/2023)   Received from [facility]  Social Connections: Moderately Isolated (12/07/2023)  Tobacco Use: Low Risk  (12/07/2023)    Readmission Risk Interventions    12/15/2023    2:29 PM  Readmission Risk Prevention Plan  Transportation Screening Complete  Medication Review (RN Care Manager) Complete  PCP or Specialist appointment within 3-5 days of discharge Complete  HRI or Home Care Consult Complete  SW Recovery Care/Counseling Consult Complete  Palliative Care Screening Complete  Skilled Nursing Facility Not Applicable

## 2023-12-27 NOTE — Telephone Encounter (Signed)
Patient's wife Shane [name] called wanting Dr. Lajoyce Corners to know patient is in the hospital at [facility] Rm 20. Shane [name] said if Dr. Lajoyce Corners could stop by to see patient when he is at the hospital she would greatly appreciate it. Shane [name] said the wound on his left foot is not doing great. The number to contact Shane [name] is    [phone]

## 2023-12-28 DIAGNOSIS — N179 Acute kidney failure, unspecified: Secondary | ICD-10-CM | POA: Diagnosis not present

## 2023-12-28 LAB — BASIC METABOLIC PANEL
Anion gap: 9 (ref 5–15)
BUN: 51 mg/dL — ABNORMAL HIGH (ref 8–23)
CO2: 33 mmol/L — ABNORMAL HIGH (ref 22–32)
Calcium: 8.7 mg/dL — ABNORMAL LOW (ref 8.9–10.3)
Chloride: 89 mmol/L — ABNORMAL LOW (ref 98–111)
Creatinine, Ser: 1.39 mg/dL — ABNORMAL HIGH (ref 0.61–1.24)
GFR, Estimated: 51 mL/min — ABNORMAL LOW (ref >60)
Glucose, Bld: 152 mg/dL — ABNORMAL HIGH (ref 70–99)
Potassium: 4.2 mmol/L (ref 3.5–5.1)
Sodium: 131 mmol/L — ABNORMAL LOW (ref 135–145)

## 2023-12-28 LAB — GLUCOSE, CAPILLARY
Glucose-Capillary: 234 mg/dL — ABNORMAL HIGH (ref 70–99)
Glucose-Capillary: 296 mg/dL — ABNORMAL HIGH (ref 70–99)
Glucose-Capillary: 254 mg/dL — ABNORMAL HIGH (ref 70–99)
Glucose-Capillary: 265 mg/dL — ABNORMAL HIGH (ref 70–99)

## 2023-12-28 LAB — MAGNESIUM: Magnesium: 1.6 mg/dL — ABNORMAL LOW (ref 1.7–2.4)

## 2023-12-28 MED ORDER — METHOCARBAMOL 500 MG PO TABS: 500.0000 mg | ORAL_TABLET | Freq: Three times a day (TID) | ORAL | Status: DC | PRN

## 2023-12-28 MED ORDER — METOPROLOL SUCCINATE ER 25 MG PO TB24
25.0000 mg | ORAL_TABLET | Freq: Every day | ORAL | Status: DC
  Administered 2023-12-29 – 2023-12-30 (×2): 25 mg via ORAL
  Filled 2023-12-28 (×2): qty 1

## 2023-12-28 MED ORDER — FUROSEMIDE 40 MG PO TABS: 40.0000 mg | ORAL_TABLET | Freq: Every day | ORAL | Status: DC

## 2023-12-28 MED ORDER — MAGNESIUM SULFATE 2 GM/50ML IV SOLN
2.0000 g | Freq: Once | INTRAVENOUS | Status: AC
  Administered 2023-12-28: 2 g via INTRAVENOUS
  Filled 2023-12-28: qty 50

## 2023-12-28 MED ORDER — INSULIN GLARGINE-YFGN 100 UNIT/ML ~~LOC~~ SOLN
40.0000 [IU] | Freq: Every day | SUBCUTANEOUS | Status: DC
  Filled 2023-12-28: qty 0.4

## 2023-12-28 NOTE — Progress Notes (Signed)
Physical Therapy Treatment Patient Details Name: Shane [name] MRN: [id number] DOB: Mar 18, 1941 Today's Date: 12/28/2023   History of Present Illness 83 year old male presented to ED 1/1 with shortness of breath.  Dx: Covid 19 PNA, Acute kidney injury, Acute hypoxic respiratory failure, Normocytic Anemia - secondary to RP bleed and critical illness, Acute HF with reduced EF, s/p CRRT. With significant past medical history including obstructive sleep apnea on CPAP at night, CAD s/p CABG x3 in 2021, aortic stenosis s/p TAVR 08/2023 at [facility], HFpEF, atrial fibrillation on Eliquis, plavix, sick sinus syndrome s/p leadless PPM, hypertension, hyperlipidemia, GERD, uncontrolled diabetes, CKD 3, pulmonary fibrosis    PT Comments  Pt resting in bed on arrival and agreeable to session with continues progress towards acute goals. Pt able to come to stand multiple times throughout session and complete transfer to chair at end of session. Pt continues to be limited by decreased activity tolerance, needing seated rest breaks often during standing activities. Physically pt requiring min A to boost to stand from elevated EOB and mod A to steady and manage RW during step pivot transfer EOB>chair with pt demonstrating poor clearance of RLE and decreased weight shift to L despite cues. Patient will benefit from continued inpatient follow up therapy, <3 hours/day to address deficits and maximize functional independence and decrease caregiver burden. Will continue to follow acutely.     If plan is discharge home, recommend the following: A lot of help with walking and/or transfers;A lot of help with bathing/dressing/bathroom;Assistance with cooking/housework;Assist for transportation;Help with stairs or ramp for entrance   Can travel by private vehicle     No  Equipment Recommendations   (defer to post-acute)    Recommendations for Other Services       Precautions / Restrictions Precautions Precautions:  Fall Precaution Comments: Lt distal-lateral foot wound. 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 Wears 2 L O2 at night Required Braces or Orthoses: Other Brace Other Brace: RLE prosthesis. Needs to continue to wear shrinker Restrictions Weight Bearing Restrictions Per Provider Order: No     Mobility  Bed Mobility Overal bed mobility: Needs Assistance Bed Mobility: Supine to Sit, Sit to Supine     Supine to sit: HOB elevated, Used rails, Contact guard Sit to supine: Supervision   General bed mobility comments: effortful and + use of bedrails    Transfers Overall transfer level: Needs assistance Equipment used: Rolling walker (2 wheels) Transfers: Sit to/from Stand, Bed to chair/wheelchair/BSC Sit to Stand: Min assist, From elevated surface   Step pivot transfers: Mod assist       General transfer comment: min A to boost to stand x2 during session, mod A to steady with stepping to chair    Ambulation/Gait                   Stairs             Wheelchair Mobility     Tilt Bed    Modified Rankin (Stroke Patients Only)       Balance Overall balance assessment: Needs assistance Sitting-balance support: No upper extremity supported, Feet supported Sitting balance-Leahy Scale: Good     Standing balance support: Bilateral upper extremity supported, Reliant on assistive device for balance Standing balance-Leahy Scale: Poor Standing balance comment: heavy reliance on BUE support in standing                            Cognition Arousal: Alert Behavior During  Therapy: WFL for tasks assessed/performed Overall Cognitive Status: Within Functional Limits for tasks assessed                                 General Comments: Slight delay with responses at times.        Exercises      General Comments        Pertinent Vitals/Pain Pain Assessment Pain Assessment: No/denies pain Pain Intervention(s): Monitored during session     Home Living                          Prior Function            PT Goals (current goals can now be found in the care plan section) Acute Rehab PT Goals Patient Stated Goal: Get well, go home PT Goal Formulation: With patient Time For Goal Achievement: 01/08/24 Progress towards PT goals: Progressing toward goals    Frequency    Min 1X/week      PT Plan      Co-evaluation              AM-PAC PT "6 Clicks" Mobility   Outcome Measure  Help needed turning from your back to your side while in a flat bed without using bedrails?: A Little Help needed moving from lying on your back to sitting on the side of a flat bed without using bedrails?: A Little Help needed moving to and from a bed to a chair (including a wheelchair)?: A Lot Help needed standing up from a chair using your arms (e.g., wheelchair or bedside chair)?: A Lot Help needed to walk in hospital room?: Total Help needed climbing 3-5 steps with a railing? : Total 6 Click Score: 12    End of Session Equipment Utilized During Treatment: Gait belt Activity Tolerance: Patient tolerated treatment well Patient left: in chair;with call bell/phone within reach Nurse Communication: Mobility status PT Visit Diagnosis: Muscle weakness (generalized) (M62.81);Difficulty in walking, not elsewhere classified (R26.2);Pain Pain - Right/Left: Right Pain - part of body:  (flank and abdomen)     Time: 3086-5784 PT Time Calculation (min) (ACUTE ONLY): 30 min  Charges:    $Therapeutic Activity: 23-37 mins PT General Charges $$ ACUTE PT VISIT: 1 Visit                     Renezmae Canlas R. PTA Acute Rehabilitation Services Office: [phone]   Catalina Antigua 12/28/2023, 2:36 PM

## 2023-12-28 NOTE — Plan of Care (Signed)
  Problem: Coping: Goal: Psychosocial and spiritual needs will be supported Outcome: Progressing   Problem: Respiratory: Goal: Will maintain a patent airway Outcome: Progressing   Problem: Coping: Goal: Ability to adjust to condition or change in health will improve Outcome: Progressing   Problem: Tissue Perfusion: Goal: Adequacy of tissue perfusion will improve Outcome: Progressing

## 2023-12-28 NOTE — Plan of Care (Signed)
  Problem: Education: Goal: Knowledge of risk factors and measures for prevention of condition will improve Outcome: Progressing   Problem: Fluid Volume: Goal: Ability to maintain a balanced intake and output will improve Outcome: Progressing   Problem: Skin Integrity: Goal: Risk for impaired skin integrity will decrease Outcome: Progressing   Problem: Clinical Measurements: Goal: Will remain free from infection Outcome: Progressing   Problem: Elimination: Goal: Will not experience complications related to urinary retention Outcome: Progressing   Problem: Safety: Goal: Ability to remain free from injury will improve Outcome: Progressing   Problem: Skin Integrity: Goal: Risk for impaired skin integrity will decrease Outcome: Progressing

## 2023-12-28 NOTE — Progress Notes (Signed)
Mobility Specialist: Progress Note   12/28/23 1532  Mobility  Activity Transferred from chair to bed  Level of Assistance Moderate assist, patient does 50-74%  Assistive Device Front wheel walker  Activity Response Tolerated well  Mobility Referral Yes  Mobility visit 1 Mobility  Mobility Specialist Start Time (ACUTE ONLY) 1505  Mobility Specialist Stop Time (ACUTE ONLY) 1522  Mobility Specialist Time Calculation (min) (ACUTE ONLY) 17 min    Pt requesting to get back in bed d/t pain and dizziness - received in bed. Stayed up in the chair about an hour. ModA for STS but minG for stand pivot to bed. Left in bed with all needs met, call bell in reach. RN aware.   Maurene Capes Mobility Specialist Please contact via SecureChat or Rehab office at [phone]

## 2023-12-28 NOTE — Progress Notes (Signed)
[facility] Progress Note Patient: Shane [name] YQM:[id number] DOB: [birth year] DOA: 12/06/2023  DOS: the patient was seen and examined on 12/28/2023  Brief Hospital Course: his 83 year old male with significant past medical history including obstructive sleep apnea on CPAP at night, CAD s/p CABG x3 in 2021, aortic stenosis s/p [facility]AVR 08/2023 at [facility], HFpEF, atrial fibrillation on Eliquis, plavix, sick sinus syndrome s/p leadless PPM, hypertension, hyperlipidemia, GERD, uncontrolled diabetes, CKD 3, pulmonary fibrosis followed by Dr. Marchelle Gearing, who presented to the emergency department on 12/06/2023 with shortness of breath.  EMS was called and found to be hypotensive and hypoxic. He was started on nonrebreather and an epinephrine infusion prior to arrival. In the ED afebrile, SBP around 100, tachypneic. Labs +COVID, AKI on CKD with BUN/Cr 116, 4.69 with critical K of 7.1. bicarb 16. CBC without leukocytosis, hgb 9.5. lactic 2.3>1.9, troponin 8,290, UA negative, blood cultures pending. CXR with likely pulmonary edema. With new AKI, hyperkalemia and EKG changes, nephrology was consulted.  He was admitted by CCM for covid pneumonia and AKI, hyperkalemia subsequently underwent CRRT. Subsequently his renal parameters improved. Cardiology also consulted   for NSTEMI, recommended medical management.    Assessment & Plan: Acute on chronic hypoxic respiratory failure, multifactorial: Likely due to COVID+, pulmonary edema due to acute systolic CHF, underlying pulmonary fibrosis. At baseline patient uses 2 L of supplemental oxygen via [place]. Patient presented with cough, shortness of breath x 3 days with no improvement on home nebulizers. Patient was found to be severely hypoxic requiring nonrebreather on arrival. Patient states his wife and grandson had been sick with COVID infection recently. SARS coronavirus 2 PCR positive, influenza A and B by PCR negative. Initial chest x-ray findings concerning for CHF  exacerbation. Patient has completed treatment for COVID with remdesivir and Decadron. Patient has undergone brief CRRT during this hospitalization. Patient with clinical improvement. HD tunneled cath removed per Nephro Continue Lasix 40 mg p.o. twice daily, will reduce to 40 mg daily. Patient has completed over 10 days of isolation.  Therapy eval recommending SNF as CIR was denied.  Patient wants to go home.   NSTEMI : Secondary to demand ischemia in the setting of COVID-19 infection with underlying cardiomyopathy. Patient denies any chest pain. Echo showed LVEF 40%,WMA, status post bioprosthetic aortic valve /status post TAVR. Cardiology recommended medical management. Heparin discontinued due to retroperitoneal bleeding.   Acute retroperitoneal bleed : CTA/P showed retroperitoneal bleeding. Status post transfusion of 1 unit platelets, 2 units PRBCs, thiamine, DDAVP. Anticoagulation for thromboembolic prophylaxis given A-fib on hold due to risk of bleeding. Due to presentation with NSTEMI and cardiac history patient was transfused 2 units PRBCs on 12/12/2023.  Repeat CT A/P (12/12/2023 ), with redemonstrated large right retroperitoneal hematoma with hematocrit level.  Patient reports worsening pain on coughing or deep breathing on the RUQ , concerned for worsening retroperitoneal bleed.  Repeat CT A/P: No significant interval change in right retroperitoneal hematoma. H/H remains stable. Continue pain control.  Continue cough control as well.   Paroxysmal atrial fibrillation: Rate controlled.  Anticoagulation on hold sec to retroperitoneal bleed.  Recommend outpatient follow up with cardiology to see if he is a candidate for restarting anti coagulation.  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 Follow up outpatient.   Hyperlipidemia Continue Lipitor 80 mg daily.   CAD s/p CABG s/p TAVR Aspirin, and heparin discontinued.     Acute kidney  injury: CRRT on 1/2 to 1/3.  Creatinine fluctuating.  Potassium level  elevated.  Initiating [place] again.   Left chronic foot wound: Wound On the plantar aspect. Wound care on board.  Wife at bedside .    Acute blood loss anemia/ Normocytic anemia/ Anemia of chronic disease: Hemoglobin stable around 10.    Hyponatremia: Improved.    Hyperkalemia : Started on [place] again.   AG metabolic acidosis Resolved.    Shock resolved.  Not largely cardiogenic but cannot rule out a septic component.. Status post dose of antibiotic coverage for community-acquired pneumonia and status post treatment for COVID infection. Shock resolved.   Dysphagia Recommend outpatient follow up with GI    Type 2 DM uncontrolled with hyperglycemia.  Adjusting insulin regimen on a daily basis.   Subjective: No nausea no vomiting no fever no chills.  Wants to go to SNF now instead of home health.  Physical Exam: General: in Mild distress, No Rash Cardiovascular: S1 and S2 Present, No Murmur Respiratory: Good respiratory effort, Bilateral Air entry present.  Basal crackles, No wheezes Abdomen: Bowel Sound present, No tenderness Extremities: No edema Neuro: Alert and oriented x3, no new focal deficit  Data Reviewed: I have Reviewed nursing notes, Vitals, and Lab results. Since last encounter, pertinent lab results CBC and BMP   . I have ordered test including BMP and magnesium  .   Disposition: Status is: Inpatient Remains inpatient appropriate because: Awaiting placement  SCDs Start: 12/06/23 2212   Family Communication: Family at bedside Level of care: Telemetry Medical   Vitals:   12/28/23 1157 12/28/23 1410 12/28/23 1412 12/28/23 1414  BP: (!) 123/50 123/64 (!) 113/53 (!) 108/51  Pulse: 75 82  88  Resp:      Temp: 98.4 F (36.9 C)     TempSrc:      SpO2: 100% 100% 95% (!) 86%  Weight:      Height:         Author: Lynden Oxford, MD 12/28/2023 6:32 PM  Please look on  [email or web address] to find out who is on call.

## 2023-12-28 NOTE — Inpatient Diabetes Management (Signed)
Inpatient Diabetes Program Recommendations  AACE/ADA: New Consensus Statement on Inpatient Glycemic Control (2015)  Target Ranges:  Prepandial:   less than 140 mg/dL      Peak postprandial:   less than 180 mg/dL (1-2 hours)      Critically ill patients:  140 - 180 mg/dL   Lab Results  Component Value Date   GLUCAP 296 (H) 12/28/2023   HGBA1C 8.5 (H) 12/06/2023    Review of Glycemic Control  Latest Reference Range & Units 12/27/23 07:53 12/27/23 12:05 12/27/23 17:22 12/27/23 20:15 12/28/23 08:25 12/28/23 11:41  Glucose-Capillary 70 - 99 mg/dL 147 (H) 829 (H) 562 (H) 267 (H) 234 (H) 296 (H)  (H): Data is abnormally high  Diabetes history: DM2 Outpatient Diabetes medications: Tresiba 20 units every day, Farxiga 10 mg QD Current orders for Inpatient glycemic control: Semglee 36 every day, Novolog 0-15 units TID and 0-5 units at bedtime, Novolog 10 units TID  Inpatient Diabetes Program Recommendations:    Semglee 40 units QAM Novolog 12 units TID  Will continue to follow while inpatient.  Thank you, Dulce Sellar, MSN, CDCES Diabetes Coordinator Inpatient Diabetes Program [phone] (team pager from 8a-5p)

## 2023-12-29 DIAGNOSIS — N179 Acute kidney failure, unspecified: Secondary | ICD-10-CM | POA: Diagnosis not present

## 2023-12-29 LAB — CBC WITH DIFFERENTIAL/PLATELET
Abs Immature Granulocytes: 0.03 10*3/uL (ref 0.00–0.07)
Basophils Absolute: 0 10*3/uL (ref 0.0–0.1)
Basophils Relative: 1 %
Eosinophils Absolute: 0.2 10*3/uL (ref 0.0–0.5)
Eosinophils Relative: 4 %
HCT: 34.7 % — ABNORMAL LOW (ref 39.0–52.0)
Hemoglobin: 10.9 g/dL — ABNORMAL LOW (ref 13.0–17.0)
Immature Granulocytes: 1 %
Lymphocytes Relative: 12 %
Lymphs Abs: 0.8 10*3/uL (ref 0.7–4.0)
MCH: 28.4 pg (ref 26.0–34.0)
MCHC: 31.4 g/dL (ref 30.0–36.0)
MCV: 90.4 fL (ref 80.0–100.0)
Monocytes Absolute: 0.7 10*3/uL (ref 0.1–1.0)
Monocytes Relative: 10 %
Neutro Abs: 4.7 10*3/uL (ref 1.7–7.7)
Neutrophils Relative %: 72 %
Platelets: 229 10*3/uL (ref 150–400)
RBC: 3.84 MIL/uL — ABNORMAL LOW (ref 4.22–5.81)
RDW: 16.8 % — ABNORMAL HIGH (ref 11.5–15.5)
WBC: 6.5 10*3/uL (ref 4.0–10.5)
nRBC: 0 % (ref 0.0–0.2)

## 2023-12-29 LAB — BASIC METABOLIC PANEL
Anion gap: 9 (ref 5–15)
BUN: 45 mg/dL — ABNORMAL HIGH (ref 8–23)
CO2: 34 mmol/L — ABNORMAL HIGH (ref 22–32)
Calcium: 8.7 mg/dL — ABNORMAL LOW (ref 8.9–10.3)
Chloride: 92 mmol/L — ABNORMAL LOW (ref 98–111)
Creatinine, Ser: 1.6 mg/dL — ABNORMAL HIGH (ref 0.61–1.24)
GFR, Estimated: 43 mL/min — ABNORMAL LOW (ref >60)
Glucose, Bld: 135 mg/dL — ABNORMAL HIGH (ref 70–99)
Potassium: 4.1 mmol/L (ref 3.5–5.1)
Sodium: 135 mmol/L (ref 135–145)

## 2023-12-29 LAB — C-REACTIVE PROTEIN: CRP: 3.5 mg/dL — ABNORMAL HIGH (ref <1.0)

## 2023-12-29 LAB — GLUCOSE, CAPILLARY
Glucose-Capillary: 216 mg/dL — ABNORMAL HIGH (ref 70–99)
Glucose-Capillary: 216 mg/dL — ABNORMAL HIGH (ref 70–99)
Glucose-Capillary: 196 mg/dL — ABNORMAL HIGH (ref 70–99)
Glucose-Capillary: 163 mg/dL — ABNORMAL HIGH (ref 70–99)

## 2023-12-29 LAB — MAGNESIUM: Magnesium: 1.9 mg/dL (ref 1.7–2.4)

## 2023-12-29 LAB — SEDIMENTATION RATE: Sed Rate: 84 mm/h — ABNORMAL HIGH (ref 0–16)

## 2023-12-29 MED ORDER — GABAPENTIN 100 MG PO CAPS
100.0000 mg | ORAL_CAPSULE | Freq: Three times a day (TID) | ORAL | Status: DC
  Administered 2023-12-30: 100 mg via ORAL
  Filled 2023-12-29: qty 1

## 2023-12-29 MED ORDER — LORAZEPAM 1 MG PO TABS: 1.0000 mg | ORAL_TABLET | Freq: Every evening | ORAL | 0 refills | Status: DC | PRN

## 2023-12-29 MED ORDER — GABAPENTIN 100 MG PO CAPS: 100.0000 mg | ORAL_CAPSULE | Freq: Three times a day (TID) | ORAL | 0 refills | Status: DC

## 2023-12-29 MED ORDER — LIDOCAINE 5 % EX PTCH: 1.0000 | MEDICATED_PATCH | CUTANEOUS | 0 refills | Status: DC

## 2023-12-29 MED ORDER — BENZONATATE 100 MG PO CAPS: 100.0000 mg | ORAL_CAPSULE | Freq: Three times a day (TID) | ORAL | 0 refills | Status: DC

## 2023-12-29 MED ORDER — TRESIBA FLEXTOUCH 100 UNIT/ML ~~LOC~~ SOPN: 45.0000 [IU] | PEN_INJECTOR | Freq: Every day | SUBCUTANEOUS | Status: DC

## 2023-12-29 MED ORDER — METHOCARBAMOL 500 MG PO TABS: 500.0000 mg | ORAL_TABLET | Freq: Three times a day (TID) | ORAL | 0 refills | Status: DC | PRN

## 2023-12-29 MED ORDER — RENA-VITE PO TABS: 1.0000 | ORAL_TABLET | Freq: Every day | ORAL | 0 refills | Status: DC

## 2023-12-29 MED ORDER — OXYCODONE HCL 5 MG PO TABS: 5.0000 mg | ORAL_TABLET | Freq: Four times a day (QID) | ORAL | Status: DC | PRN

## 2023-12-29 MED ORDER — METOPROLOL SUCCINATE ER 25 MG PO TB24: 25.0000 mg | ORAL_TABLET | Freq: Every day | ORAL | 0 refills | Status: DC

## 2023-12-29 MED ORDER — INSULIN ASPART 100 UNIT/ML IJ SOLN
12.0000 [IU] | Freq: Three times a day (TID) | INTRAMUSCULAR | Status: DC
Start: 2023-12-29 — End: 2023-12-30
  Administered 2023-12-29: 12 [IU] via SUBCUTANEOUS

## 2023-12-29 MED ORDER — DOCUSATE SODIUM 100 MG PO CAPS: 100.0000 mg | ORAL_CAPSULE | Freq: Two times a day (BID) | ORAL | 0 refills | Status: DC

## 2023-12-29 MED ORDER — DEXTROMETHORPHAN POLISTIREX ER 30 MG/5ML PO SUER: 15.0000 mg | Freq: Two times a day (BID) | ORAL | 0 refills | Status: DC

## 2023-12-29 MED ORDER — MECLIZINE HCL 12.5 MG PO TABS
12.5000 mg | ORAL_TABLET | Freq: Two times a day (BID) | ORAL | Status: DC
  Administered 2023-12-29 – 2023-12-30 (×3): 12.5 mg via ORAL
  Filled 2023-12-29 (×4): qty 1

## 2023-12-29 MED ORDER — HYDROCODONE BIT-HOMATROP MBR 5-1.5 MG/5ML PO SOLN: 5.0000 mL | Freq: Four times a day (QID) | ORAL | 0 refills | Status: DC | PRN

## 2023-12-29 MED ORDER — INSULIN GLARGINE-YFGN 100 UNIT/ML ~~LOC~~ SOLN
45.0000 [IU] | Freq: Every day | SUBCUTANEOUS | Status: DC
  Administered 2023-12-29 – 2023-12-30 (×2): 45 [IU] via SUBCUTANEOUS
  Filled 2023-12-29 (×2): qty 0.45

## 2023-12-29 MED ORDER — NEPRO/CARBSTEADY PO LIQD: 237.0000 mL | Freq: Two times a day (BID) | ORAL | Status: DC

## 2023-12-29 MED ORDER — CYANOCOBALAMIN 1000 MCG PO TABS: 1000.0000 ug | ORAL_TABLET | Freq: Every day | ORAL | 3 refills | Status: DC

## 2023-12-29 NOTE — Progress Notes (Signed)
[facility] Progress Note Patient: Shane [name] ION:[id number] DOB: 1941-02-24 DOA: 12/06/2023  DOS: the patient was seen and examined on 12/29/2023  Brief Hospital Course: his 83 year old male with significant past medical history including obstructive sleep apnea on CPAP at night, CAD s/p CABG x3 in 2021, aortic stenosis s/p TAVR 08/2023 at [facility], HFpEF, atrial fibrillation on Eliquis, plavix, sick sinus syndrome s/p leadless PPM, hypertension, hyperlipidemia, GERD, uncontrolled diabetes, CKD 3, pulmonary fibrosis followed by Dr. Marchelle Gearing, who presented to the emergency department on 12/06/2023 with shortness of breath.  EMS was called and found to be hypotensive and hypoxic. He was started on nonrebreather and an epinephrine infusion prior to arrival. In the ED afebrile, SBP around 100, tachypneic. Labs +COVID, AKI on CKD with BUN/Cr 116, 4.69 with critical K of 7.1. bicarb 16. CBC without leukocytosis, hgb 9.5. lactic 2.3>1.9, troponin 8,290, UA negative, blood cultures pending. CXR with likely pulmonary edema. With new AKI, hyperkalemia and EKG changes, nephrology was consulted.  He was admitted by CCM for covid pneumonia and AKI, hyperkalemia subsequently underwent CRRT. Subsequently his renal parameters improved. Cardiology also consulted   for NSTEMI, recommended medical management.    Assessment & Plan: Acute on chronic hypoxic respiratory failure, multifactorial: Likely due to COVID+, pulmonary edema due to acute systolic CHF, underlying pulmonary fibrosis. At baseline patient uses 2 L of supplemental oxygen via . Patient presented with cough, shortness of breath x 3 days with no improvement on home nebulizers. Patient was found to be severely hypoxic requiring nonrebreather on arrival. SARS coronavirus 2 PCR positive, influenza A and B by PCR negative. Initial chest x-ray findings concerning for CHF exacerbation. Patient has completed treatment for COVID with remdesivir and  Decadron. Patient has undergone brief CRRT during this hospitalization. Patient with clinical improvement. HD tunneled cath removed per Nephro Was on Lasix.  Now that the patient appears to be hypovolemic with orthostatic hypotension will be discontinuing the Lasix for now and recommend to use it either as needed or every other day going forward. Patient has completed over 10 days of isolation.    NSTEMI : Secondary to demand ischemia in the setting of COVID-19 infection with underlying cardiomyopathy. Patient denies any chest pain. Echo showed LVEF 40%,WMA, status post bioprosthetic aortic valve /status post TAVR. Cardiology recommended medical management. Heparin discontinued due to retroperitoneal bleeding.   Acute retroperitoneal bleed : Ongoing abdominal pain. CTA/P showed retroperitoneal bleeding. Status post transfusion of 1 unit platelets, 2 units PRBCs, thiamine, DDAVP. Anticoagulation for thromboembolic prophylaxis given A-fib on hold due to risk of bleeding. Due to presentation with NSTEMI and cardiac history patient was transfused 2 units PRBCs on 12/12/2023.  Repeat CT A/P (12/12/2023 ), with redemonstrated large right retroperitoneal hematoma with hematocrit level.  Patient reports worsening pain on coughing or deep breathing on the RUQ , concerned for worsening retroperitoneal bleed.  Repeat CT A/P: No significant interval change in right retroperitoneal hematoma. H/H remains stable.  The patient continues to report severe ongoing abdominal pain now new pain reported on the left side. Will repeat another CT scan to monitor for new areas of bleeding. Continue pain control.  Continue cough control as well.   Paroxysmal atrial fibrillation: Rate controlled.  Anticoagulation on hold sec to retroperitoneal bleed.  Recommend outpatient follow up with cardiology to see if he is a candidate for restarting anti coagulation.  History of complete heart block/SSS/status post pacemaker  implantation PPM Stable. Follow up outpatient.   Hyperlipidemia Continue Lipitor 80 mg daily.  CAD s/p CABG s/p TAVR Aspirin, and heparin discontinued.     Acute kidney injury: Prior to admission baseline appears to be around 1.5-1.7.  Presented with serum creatinine of 4.6. CRRT on 1/2 to 1/3.  Creatinine improved all the way down to 1.26 on 1/13 and now trending up to 1.60. Potassium also fluctuating and improved with Lokelma initiation as well as renal diet. Hold Lasix.  CT to evaluate for obstruction.   Left chronic foot wound: Wound On the plantar aspect. Wound care on board.    Acute blood loss anemia/ Normocytic anemia/ Anemia of chronic disease: Hemoglobin stable around 10.    Hyponatremia: Improved.    Hyperkalemia : Started on [place] again.   AG metabolic acidosis Resolved.    Shock resolved.  Not largely cardiogenic but cannot rule out a septic component.. Status post dose of antibiotic coverage for community-acquired pneumonia and status post treatment for COVID infection. Shock resolved.   Dysphagia Recommend outpatient follow up with GI    Type 2 DM uncontrolled with hyperglycemia.  Adjusting insulin regimen on a daily basis.   Subjective: Reported nausea.  Reports new abdominal pain on the left side now. No fever no chills.  Reported dizziness as well when standing up.  Physical Exam: Clear to auscultation. Bowel sound present. Severe abdominal tenderness on examination diffusely present. Alert and awake and oriented. In moderate distress.  Data Reviewed: I have Reviewed nursing notes, Vitals, and Lab results. Reviewed CBC and CMP. Reordered CBC and CMP. Also orders CT abdomen pelvis.  Disposition: Status is: Inpatient Remains inpatient appropriate because: Initiating further workup.  If stable.  Likely can be discharged to SNF on 1/25.  SCDs Start: 12/06/23 2212   Family Communication: Family at bedside Level of care: Telemetry  Medical   Vitals:   12/29/23 1102 12/29/23 1109 12/29/23 1201 12/29/23 1618  BP: (!) 106/41 (!) 116/99 (!) 131/51 (!) 104/51  Pulse: 87 95 77 77  Resp:    18  Temp:   99.2 F (37.3 C) 98.1 F (36.7 C)  TempSrc:   Oral   SpO2:    100%  Weight:      Height:         Author: Lynden Oxford, MD 12/29/2023 7:25 PM  Please look on [email or web address] to find out who is on call.

## 2023-12-29 NOTE — Progress Notes (Signed)
Mobility Specialist: Progress Note   12/29/23 1300  Mobility  Activity Transferred from chair to bed  Level of Assistance Moderate assist, patient does 50-74%  Assistive Device Front wheel walker  Activity Response Tolerated well  Mobility Referral Yes  Mobility visit 1 Mobility  Mobility Specialist Start Time (ACUTE ONLY) 1250  Mobility Specialist Stop Time (ACUTE ONLY) 1258  Mobility Specialist Time Calculation (min) (ACUTE ONLY) 8 min    Pt requested to get back to bed - received in chair. Stated he was nauseous earlier but feeling better now. ModA for STS from lower surface, minG for stand pivot. Left in bed with all needs met, call bell in reach.   Maurene Capes Mobility Specialist Please contact via SecureChat or Rehab office at [phone]

## 2023-12-29 NOTE — Progress Notes (Addendum)
Occupational Therapy Treatment Patient Details Name: Shane [name] MRN: [id number] DOB: Jan 13, 1941 Today's Date: 12/29/2023   History of present illness 83 year old male presented to ED 1/1 with shortness of breath.  Dx: Covid 19 PNA, Acute kidney injury, Acute hypoxic respiratory failure, Normocytic Anemia - secondary to RP bleed and critical illness, Acute HF with reduced EF, s/p CRRT. With significant past medical history including obstructive sleep apnea on CPAP at night, CAD s/p CABG x3 in 2021, aortic stenosis s/p TAVR 08/2023 at [facility], HFpEF, atrial fibrillation on Eliquis, plavix, sick sinus syndrome s/p leadless PPM, hypertension, hyperlipidemia, GERD, uncontrolled diabetes, CKD 3, pulmonary fibrosis   OT comments  Pt. Seen for skilled OT treatment session.  Agreeable to eob seated BUE exercises with un supported sitting.  Pt. Tolerated well and able to initiate rest breaks appropriately.  Encouragement for PLB when needed.  Pt. making gains With OT stated goals.  Cont. With acute OT POC.        If plan is discharge home, recommend the following:  A lot of help with walking and/or transfers;Two people to help with walking and/or transfers;A lot of help with bathing/dressing/bathroom   Equipment Recommendations  None recommended by OT    Recommendations for Other Services      Precautions / Restrictions Precautions Precautions: Fall Precaution Comments: Lt distal-lateral foot wound. 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 Wears 2 L O2 at night Other Brace: RLE prosthesis. Needs to continue to wear shrinker       Mobility Bed Mobility  Rolling S Sidelying to sit S Sit to sidelying S  Cues for hand placement, no physical assistance required Pt. Able to scoot x2 towards hob prior to laying down                  Transfers                         Balance                                           ADL either performed or assessed with clinical  judgement   ADL                                              Extremity/Trunk Assessment              Vision       Perception     Praxis      Cognition Arousal: Alert Behavior During Therapy: WFL for tasks assessed/performed Overall Cognitive Status: Within Functional Limits for tasks assessed                                 General Comments: Slight delay with responses at times.        Exercises General Exercises - Upper Extremity Shoulder Flexion: Strengthening, Both, 10 reps, Seated, Theraband Theraband Level (Shoulder Flexion): Level 3 (Green) Shoulder Horizontal ABduction: Strengthening, Both, 10 reps, Seated, Theraband Theraband Level (Shoulder Horizontal Abduction): Level 3 (Green) Elbow Flexion: Strengthening, Both, 10 reps, Seated, Theraband Theraband Level (Elbow Flexion): Level 3 (Green) Elbow Extension: Strengthening, Both, 10 reps, Seated, Theraband Theraband Level (Elbow Extension): Level 3 (  Green)    Shoulder Instructions       General Comments      Pertinent Vitals/ Pain       Pain Assessment Pain Assessment: No/denies pain  Home Living                                          Prior Functioning/Environment              Frequency  Min 1X/week        Progress Toward Goals  OT Goals(current goals can now be found in the care plan section)  Progress towards OT goals: Progressing toward goals     Plan      Co-evaluation                 AM-PAC OT "6 Clicks" Daily Activity     Outcome Measure   Help from another person eating meals?: None Help from another person taking care of personal grooming?: A Little Help from another person toileting, which includes using toliet, bedpan, or urinal?: A Lot Help from another person bathing (including washing, rinsing, drying)?: A Lot Help from another person to put on and taking off regular upper body clothing?: A Little Help  from another person to put on and taking off regular lower body clothing?: A Lot 6 Click Score: 16    End of Session    OT Visit Diagnosis: Unsteadiness on feet (R26.81);Other abnormalities of gait and mobility (R26.89);Muscle weakness (generalized) (M62.81)   Activity Tolerance Patient tolerated treatment well   Patient Left in bed;with call bell/phone within reach;with bed alarm set   Nurse Communication          Time: 8295-6213 OT Time Calculation (min): 21 min  Charges: OT General Charges $OT Visit: 1 Visit OT Treatments $Therapeutic Exercise: 8-22 mins  Boneta Lucks, COTA/L Acute Rehabilitation [phone]   Alessandra Bevels Lorraine-COTA/L 12/29/2023, 2:26 PM

## 2023-12-29 NOTE — Progress Notes (Signed)
Mobility Specialist: Progress Note   12/29/23 1205  Mobility  Activity Transferred from bed to chair  Level of Assistance Moderate assist, patient does 50-74%  Assistive Device Front wheel walker  Activity Response Tolerated well  Mobility Referral Yes  Mobility visit 1 Mobility  Mobility Specialist Start Time (ACUTE ONLY) 1055  Mobility Specialist Stop Time (ACUTE ONLY) 1112  Mobility Specialist Time Calculation (min) (ACUTE ONLY) 17 min    Pt was agreeable to mobility session - received in bed. C/o dizziness upon sitting EOB and standing for a while during orthostatics. ModA for STS, MinG for stand pivot to chair. Left in chair with all needs met, call bell in reach.   Maurene Capes Mobility Specialist Please contact via SecureChat or Rehab office at [phone]

## 2023-12-29 NOTE — Progress Notes (Signed)
Nutrition Follow-up  DOCUMENTATION CODES:   Not applicable  INTERVENTION:  Encourage adequate PO intake  Continue protein containing snacks TID between meals  N[place]ro Shake po BID, each supplement provides 425 kcal and 19 grams protein Renal MVI with minerals daily Recommend Vitamin B12 supplementation as vitamin B12 typically results significantly elevated in acutely ill populations; MD ordere   NUTRITION DIAGNOSIS:   Inadequate oral intake related to chronic illness (CAD, AS, CKD, DM) as evidenced by per patient/family report.  Improving  GOAL:   Patient will meet greater than or equal to 90% of their needs    MONITOR:   PO intake, Supplement acceptance, Labs, Weight trends, Diet advancement, I & O's, Skin  REASON FOR ASSESSMENT:   Consult Assessment of nutrition requirement/status (CRRT protocol)  ASSESSMENT:   Pt admitted with AKI and hypotension, found to be COVID+ on admission. 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  Patient continues to have fair to good appetite. States no concerns at this time.  Suspect current nutr poc providing adequate nutrition.   Hospital weight history:  12/29/23 0455 105.7 kg 233.03 lbs  12/28/23 0514 104.7 kg 230.82 lbs  12/27/23 0450 106.3 kg 234.35 lbs  12/25/23 0500 105.5 kg 232.5 lbs  12/24/23 04:00:01 106.4 kg 234.57 lbs  12/23/23 05:15:57 106.2 kg 234.13 lbs  12/22/23 0541 105.7 kg 233.03 lbs  12/21/23 0408 108.6 kg 239.42 lbs  12/20/23 0630 104.1 kg 229.5 lbs  12/18/23 0419 105.4 kg 232.37 lbs  12/17/23 0500 103.8 kg 228.84 lbs  12/16/23 0119 106.8 kg 235.45 lbs  12/15/23 06:16:55 107.1 kg 236.11 lbs  12/14/23 0500 106 kg 233.69 lbs  12/11/23 0500 108.4 kg 238.98 lbs  12/10/23 0500 107.9 kg 237.88 lbs  12/08/23 0500 108.6 kg 239.42 lbs  12/07/23 0344 109.4 kg 241.18 lbs  12/07/23 0000 109.4 kg 241.18  lbs  12/06/23 1805 102 kg 224.87 lbs      Average Meal Intake: 40-100: 52% intake x 6 recorded meals  Nutritionally Relevant Medications: Scheduled Meds:  atorvastatin  80 mg Oral Daily   vitamin B-12  1,000 mcg Oral Daily   feeding supplement (NEPRO CARB STEADY)  237 mL Oral BID BM   furosemide  40 mg Oral Daily   gabapentin  300 mg Oral Daily   multivitamin  1 tablet Oral QHS   traZODone  75 mg Oral QHS    Labs Reviewed    NUTRITION - FOCUSED PHYSICAL EXAM:  Flowsheet Row Most Recent Value  Orbital Region No depletion  Upper Arm Region Mild depletion  Thoracic and Lumbar Region No depletion  Buccal Region No depletion  Temple Region No depletion  Clavicle Bone Region Mild depletion  Clavicle and Acromion Bone Region No depletion  Scapular Bone Region No depletion  Dorsal Hand Unable to assess  [edema]  Patellar Region Moderate depletion  Anterior Thigh Region Mild depletion  Posterior Calf Region Unable to assess  [R BKA]  Edema (RD Assessment) Mild  [hand edema]  Hair Reviewed  Eyes Reviewed  Mouth Reviewed  Skin Reviewed  Nails Reviewed       Diet Order:   Diet Order             Diet renal with fluid restriction Fluid restriction: 1200 mL Fluid; Room service appropriate? Yes; Fluid consistency: Thin  Diet effective now  EDUCATION NEEDS:   Education needs have been addressed  Skin:  Skin Assessment: Reviewed RN Assessment Skin Integrity Issues:: Other (Comment) Other: L foot non-pressure wound 2.5cm x 1.5 cm  Last BM:  12/28/23  Height:   Ht Readings from Last 1 Encounters:  12/06/23 6' (1.829 m)    Weight:   Wt Readings from Last 1 Encounters:  12/29/23 105.7 kg    Ideal Body Weight:  80.9 kg  BMI:  Body mass index is 31.6 kg/m.  Estimated Nutritional Needs:   Kcal:  2000-2200  Protein:  130-145g  Fluid:  1L + [name]    Jamelle Haring RDN, LDN Clinical Dietitian   If unable to reach, please contact "RD  Inpatient" secure chat group between 8 am-4 pm daily"

## 2023-12-30 ENCOUNTER — Inpatient Hospital Stay (HOSPITAL_COMMUNITY): Payer: Medicare Other

## 2023-12-30 DIAGNOSIS — N179 Acute kidney failure, unspecified: Secondary | ICD-10-CM | POA: Diagnosis not present

## 2023-12-30 LAB — MAGNESIUM: Magnesium: 1.8 mg/dL (ref 1.7–2.4)

## 2023-12-30 LAB — BASIC METABOLIC PANEL
Anion gap: 12 (ref 5–15)
BUN: 43 mg/dL — ABNORMAL HIGH (ref 8–23)
CO2: 34 mmol/L — ABNORMAL HIGH (ref 22–32)
Calcium: 8.9 mg/dL (ref 8.9–10.3)
Chloride: 89 mmol/L — ABNORMAL LOW (ref 98–111)
Creatinine, Ser: 1.4 mg/dL — ABNORMAL HIGH (ref 0.61–1.24)
GFR, Estimated: 50 mL/min — ABNORMAL LOW (ref >60)
Glucose, Bld: 119 mg/dL — ABNORMAL HIGH (ref 70–99)
Potassium: 4.4 mmol/L (ref 3.5–5.1)
Sodium: 135 mmol/L (ref 135–145)

## 2023-12-30 LAB — GLUCOSE, CAPILLARY
Glucose-Capillary: 132 mg/dL — ABNORMAL HIGH (ref 70–99)
Glucose-Capillary: 179 mg/dL — ABNORMAL HIGH (ref 70–99)

## 2023-12-30 MED ORDER — MECLIZINE HCL 12.5 MG PO TABS: 12.5000 mg | ORAL_TABLET | Freq: Two times a day (BID) | ORAL | 0 refills | Status: AC

## 2023-12-30 MED ORDER — FUROSEMIDE 40 MG PO TABS: 40.0000 mg | ORAL_TABLET | Freq: Every day | ORAL | 0 refills | Status: DC

## 2023-12-30 NOTE — Plan of Care (Signed)
Problem: Education: Goal: Knowledge of risk factors and measures for prevention of condition will improve Outcome: Progressing   Problem: Coping: Goal: Psychosocial and spiritual needs will be supported Outcome: Progressing   Problem: Respiratory: Goal: Will maintain a patent airway Outcome: Progressing Goal: Complications related to the disease process, condition or treatment will be avoided or minimized Outcome: Progressing   Problem: Education: Goal: Ability to describe self-care measures that may prevent or decrease complications (Diabetes Survival Skills Education) will improve Outcome: Progressing Goal: Individualized Educational Video(s) Outcome: Progressing   Problem: Coping: Goal: Ability to adjust to condition or change in health will improve Outcome: Progressing   Problem: Fluid Volume: Goal: Ability to maintain a balanced intake and output will improve Outcome: Progressing   Problem: Health Behavior/Discharge Planning: Goal: Ability to identify and utilize available resources and services will improve Outcome: Progressing Goal: Ability to manage health-related needs will improve Outcome: Progressing   Problem: Metabolic: Goal: Ability to maintain appropriate glucose levels will improve Outcome: Progressing   Problem: Nutritional: Goal: Maintenance of adequate nutrition will improve Outcome: Progressing Goal: Progress toward achieving an optimal weight will improve Outcome: Progressing   Problem: Skin Integrity: Goal: Risk for impaired skin integrity will decrease Outcome: Progressing   Problem: Tissue Perfusion: Goal: Adequacy of tissue perfusion will improve Outcome: Progressing   Problem: Education: Goal: Knowledge of General Education information will improve Description: Including pain rating scale, medication(s)/side effects and non-pharmacologic comfort measures Outcome: Progressing   Problem: Health Behavior/Discharge Planning: Goal:  Ability to manage health-related needs will improve Outcome: Progressing   Problem: Clinical Measurements: Goal: Ability to maintain clinical measurements within normal limits will improve Outcome: Progressing Goal: Will remain free from infection Outcome: Progressing Goal: Diagnostic test results will improve Outcome: Progressing Goal: Respiratory complications will improve Outcome: Progressing Goal: Cardiovascular complication will be avoided Outcome: Progressing   Problem: Activity: Goal: Risk for activity intolerance will decrease Outcome: Progressing   Problem: Nutrition: Goal: Adequate nutrition will be maintained Outcome: Progressing   Problem: Coping: Goal: Level of anxiety will decrease Outcome: Progressing   Problem: Elimination: Goal: Will not experience complications related to bowel motility Outcome: Progressing Goal: Will not experience complications related to urinary retention Outcome: Progressing   Problem: Pain Management: Goal: General experience of comfort will improve Outcome: Progressing   Problem: Safety: Goal: Ability to remain free from injury will improve Outcome: Progressing   Problem: Skin Integrity: Goal: Risk for impaired skin integrity will decrease Outcome: Progressing

## 2023-12-30 NOTE — TOC Transition Note (Signed)
Transition of Care [facility]) - Discharge Note   Patient Details  Name: Shane [name] MRN: [id number] Date of Birth: [birth year]  Transition of Care [facility] CM/SW Contact:  Deatra Robinson, [place] Phone Number: 12/30/2023, 12:31 PM   Clinical Narrative:   Pt for dc to [facility] today. Spoke to [place] in admissions who confirmed they are prepared to admit pt to room 506. Pt's wife at bedside and agreeable to dc plan. RN provided with number for report and PTAR arranged for transport. SW signing off at dc.   Dellie Burns, MSW, LCSW [phone] (coverage)      Final next level of care: Skilled Nursing Facility Barriers to Discharge: Barriers Resolved   Patient Goals and CMS Choice   CMS Medicare.gov Compare Post Acute Care list provided to:: Patient Choice offered to / list presented to : Patient  ownership interest in [facility].provided to:: Patient    Discharge Placement              Patient chooses bed at: [facility] Patient to be transferred to facility by: [facility] Name of family member notified: Patricia/wife Patient and family notified of of transfer: 12/30/23  Discharge Plan and Services Additional resources added to the After Visit Summary for                                       Social Drivers of Health (SDOH) Interventions SDOH Screenings   Food Insecurity: No Food Insecurity (12/07/2023)  Housing: Low Risk  (12/07/2023)  Transportation Needs: No Transportation Needs (12/07/2023)  Utilities: Not At Risk (12/07/2023)  Depression (PHQ2-9): Low Risk  (01/11/2021)  Financial Resource Strain: Low Risk  (08/22/2023)   Received from [facility]  Social Connections: Moderately Isolated (12/07/2023)  Tobacco Use: Low Risk  (12/07/2023)     Readmission Risk Interventions    12/15/2023    2:29 PM  Readmission Risk Prevention Plan  Transportation Screening Complete  Medication Review (RN Care Manager) Complete  PCP or  Specialist appointment within 3-5 days of discharge Complete  HRI or Home Care Consult Complete  SW Recovery Care/Counseling Consult Complete  Palliative Care Screening Complete  Skilled Nursing Facility Not Applicable

## 2023-12-30 NOTE — Discharge Summary (Addendum)
Physician Discharge Summary   Patient: Shane [name] MRN: [id number] DOB: 18-Jul-1941  Admit date:     12/06/2023  Discharge date: 12/30/23  Discharge Physician: Lynden Oxford  PCP: [name], MD  Recommendations at discharge:  Follow up with PCP in 1 week   Follow-up Information     Gaca, Versie Starks, MD. Schedule an appointment as soon as possible for a visit in 2 week(s).   Specialty: Cardiothoracic Surgery Why: Post-discharge follow up to updated your provider regarding blood thinner and antiplatelets. Contact information: [address] [place] [address] [phone]         [facility]. Go on 01/29/2024.   Specialty: Gastroenterology Why: Please arrive at the office to see [name]. Your appointment is at 130 however please arrive 15 to 20 mins early. Contact information: [address] [place] [address] [phone]        [name], MD Follow up in 2 week(s).   Specialty: Orthopedic Surgery Contact information: [address] [place] [address] [phone]         [facility], [facility]. Schedule an appointment as soon as possible for a visit in 2 week(s).   Why: with BMP lab to look at kidney and electrolytes Contact information: [address] [place] [address] [phone]                Discharge Diagnoses: Principal Problem:   Acute kidney injury (nontraumatic) (HCC) Active Problems:   COVID-19 virus infection   PAD (peripheral artery disease) (HCC)   Atrial fibrillation (HCC)   Hyperkalemia   Acute on chronic congestive heart failure (HCC)   Non-ST elevation (NSTEMI) myocardial infarction [name])   Retroperitoneal bleeding   History of transcatheter aortic valve replacement (TAVR)   Chronic heart failure with preserved ejection fraction (HFpEF) (HCC)   Hx of CABG   Atherosclerosis of native coronary artery of native heart without angina  pectoris   Acute on chronic hypoxic respiratory failure (HCC)   Anemia   Esophageal dysphagia   COVID-19   Type 2 diabetes mellitus without complication, with long-term current use of insulin (HCC)   Non-pressure chronic ulcer of other part of left foot limited to breakdown of skin [facility])  Brief Hospital Course: his 83 year old male with significant past medical history including obstructive sleep apnea on CPAP at night, CAD s/p CABG x3 in 2021, aortic stenosis s/p TAVR 08/2023 at [facility], HFpEF, atrial fibrillation on Eliquis, plavix, sick sinus syndrome s/p leadless PPM, hypertension, hyperlipidemia, GERD, uncontrolled diabetes, CKD 3, pulmonary fibrosis followed by Dr. Marchelle Gearing, who presented to the emergency department on 12/06/2023 with shortness of breath.  EMS was called and found to be hypotensive and hypoxic. He was started on nonrebreather and an epinephrine infusion prior to arrival. In the ED afebrile, SBP around 100, tachypneic. Labs +COVID, AKI on CKD with BUN/Cr 116, 4.69 with critical K of 7.1. bicarb 16. CBC without leukocytosis, hgb 9.5. lactic 2.3>1.9, troponin 8,290, UA negative, blood cultures pending. CXR with likely pulmonary edema. With new AKI, hyperkalemia and EKG changes, nephrology was consulted.  He was admitted by CCM for covid pneumonia and AKI, hyperkalemia subsequently underwent CRRT. Subsequently his renal parameters improved. Cardiology also consulted   for NSTEMI, recommended medical management.    Assessment & Plan: Acute on chronic hypoxic respiratory failure, multifactorial: Likely due to COVID+, pulmonary edema due to acute systolic CHF, underlying pulmonary fibrosis. At baseline patient uses 2 L of supplemental  oxygen via [place]. Patient presented with cough, shortness of breath x 3 days with no improvement on home nebulizers. Patient was found to be severely hypoxic requiring nonrebreather on arrival. SARS coronavirus 2 PCR positive, influenza A and B by PCR  negative. Initial chest x-ray findings concerning for CHF exacerbation. Patient has completed treatment for COVID with remdesivir and Decadron. Patient has undergone brief CRRT during this hospitalization. Patient with clinical improvement. HD tunneled cath removed per Nephro Was on Lasix bid, reduce to daily.  Patient has completed over 10 days of isolation.    NSTEMI : Secondary to demand ischemia in the setting of COVID-19 infection with underlying cardiomyopathy. Patient denies any chest pain. Echo showed LVEF 40%,WMA, status post bioprosthetic aortic valve /status post TAVR. Cardiology recommended medical management. Heparin discontinued due to retroperitoneal bleeding.   Acute retroperitoneal bleed : Ongoing abdominal pain. CTA/P showed retroperitoneal bleeding. Status post transfusion of 1 unit platelets, 2 units PRBCs, thiamine, DDAVP. Anticoagulation for thromboembolic prophylaxis given A-fib on hold due to risk of bleeding. Due to presentation with NSTEMI and cardiac history patient was transfused 2 units PRBCs on 12/12/2023.  Repeat CT A/P (12/12/2023 ), with redemonstrated large right retroperitoneal hematoma with hematocrit level.  Patient reports worsening pain on coughing or deep breathing on the RUQ , concerned for worsening retroperitoneal bleed.  Repeat CT A/P: No significant interval change in right retroperitoneal hematoma. H/H remains stable.  The patient continues to report severe ongoing abdominal pain now new pain reported on the left side. Repeat CT on 1/25 shows no new bleeding and stable hematoma Continue pain control.  Continue cough control as well.   Paroxysmal atrial fibrillation: Rate controlled.  Anticoagulation on hold sec to retroperitoneal bleed.  Recommend outpatient follow up with cardiology to see if he is a candidate for restarting anti coagulation.  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 Follow up  outpatient.   Hyperlipidemia Continue Lipitor 80 mg daily.   CAD s/p CABG s/p TAVR Aspirin, and heparin discontinued.     Acute kidney injury: Prior to admission baseline appears to be around 1.5-1.7.  Presented with serum creatinine of 4.6. CRRT on 1/2 to 1/3.  Creatinine improved all the way down to 1.26 on 1/13 and now trending up to 1.60. Potassium also fluctuating and improved with Lokelma initiation as well as renal diet. Hold Lasix.  CT to evaluate for obstruction.   Left chronic foot wound: Wound On the plantar aspect. Wound care on board.    Acute blood loss anemia/ Normocytic anemia/ Anemia of chronic disease: Hemoglobin stable around 10.    Hyponatremia: Improved.    Hyperkalemia : Started on [place] again.   AG metabolic acidosis Resolved.    Shock resolved.  Not largely cardiogenic but cannot rule out a septic component.. Status post dose of antibiotic coverage for community-acquired pneumonia and status post treatment for COVID infection. Shock resolved.   Dysphagia Recommend outpatient follow up with GI    Type 2 DM uncontrolled with hyperglycemia.  Adjusting insulin regimen on a daily basis.   Consultants:  Cardiology  Orthopedics  [facility] Gastroenterology  General surgery  Nephrology   Procedures performed:  Echocardiogram   DISCHARGE MEDICATION: Allergies as of 12/30/2023       Reactions   Codeine Hives, Itching   Ofev [nintedanib] Diarrhea   SEVERE DIARRHEA   Pirfenidone Diarrhea, Other (See Comments)   Esbriet (Pirfenidone) causes elevated LFTs. D/C on 06/14/17 and SEVERE DIARRHEA  Medication List     STOP taking these medications    apixaban 5 MG Tabs tablet Commonly known as: ELIQUIS   clopidogrel 75 MG tablet Commonly known as: PLAVIX   lisinopril 10 MG tablet Commonly known as: ZESTRIL   metoprolol tartrate 25 MG tablet Commonly known as: LOPRESSOR   nitroGLYCERIN 0.2 mg/hr patch Commonly known as: NITRODUR -  Dosed in mg/24 hr   ZZZQUIL PO       TAKE these medications    albuterol (2.5 MG/3ML) 0.083% nebulizer solution Commonly known as: PROVENTIL Take 2.5 mg by nebulization 2 (two) times daily as needed for wheezing or shortness of breath. What changed: Another medication with the same name was changed. Make sure you understand how and when to take each.   albuterol 108 (90 Base) MCG/ACT inhaler Commonly known as: VENTOLIN HFA Inhale 2 puffs into the lungs every 4 (four) hours as needed for wheezing or shortness of breath. What changed: how much to take   atorvastatin 80 MG tablet Commonly known as: LIPITOR TAKE 1 TABLET BY MOUTH EVERY DAY What changed: when to take this   benzonatate 100 MG capsule Commonly known as: TESSALON Take 1 capsule (100 mg total) by mouth 3 (three) times daily.   cyanocobalamin 1000 MCG tablet Take 1 tablet (1,000 mcg total) by mouth daily.   dextromethorphan 30 MG/5ML liquid Commonly known as: DELSYM Take 2.5 mLs (15 mg total) by mouth 2 (two) times daily.   docusate sodium 100 MG capsule Commonly known as: COLACE Take 1 capsule (100 mg total) by mouth 2 (two) times daily. What changed: when to take this   escitalopram 10 MG tablet Commonly known as: LEXAPRO Take 1 tablet (10 mg total) by mouth daily.   Farxiga 10 MG Tabs tablet Generic drug: dapagliflozin propanediol Take 10 mg by mouth daily.   feeding supplement (NEPRO CARB STEADY) Liqd Take 237 mLs by mouth 2 (two) times daily between meals.   furosemide 40 MG tablet Commonly known as: Lasix Take 1 tablet (40 mg total) by mouth daily. What changed:  medication strength how much to take   gabapentin 100 MG capsule Commonly known as: NEURONTIN Take 1 capsule (100 mg total) by mouth 3 (three) times daily. What changed:  medication strength See the new instructions.   HumaLOG KwikPen 100 UNIT/ML KwikPen Generic drug: insulin lispro Inject 10-15 Units into the skin 3 (three)  times daily. Depending what his blood sugar reading is   HYDROcodone bit-homatropine 5-1.5 MG/5ML syrup Commonly known as: HYCODAN Take 5 mLs by mouth every 6 (six) hours as needed for cough. What changed: when to take this   lidocaine 5 % Commonly known as: LIDODERM Place 1 patch onto the skin daily. Remove & Discard patch within 12 hours or as directed by MD   LORazepam 1 MG tablet Commonly known as: ATIVAN Take 1 tablet (1 mg total) by mouth at bedtime as needed for sleep. What changed:  when to take this reasons to take this   meclizine 12.5 MG tablet Commonly known as: ANTIVERT Take 1 tablet (12.5 mg total) by mouth 2 (two) times daily for 7 days.   methocarbamol 500 MG tablet Commonly known as: ROBAXIN Take 1 tablet (500 mg total) by mouth every 8 (eight) hours as needed for muscle spasms.   metoprolol succinate 25 MG 24 hr tablet Commonly known as: TOPROL-XL Take 1 tablet (25 mg total) by mouth daily.   multivitamin Tabs tablet Take 1 tablet by mouth  at bedtime.   multivitamin with minerals Tabs tablet Take 1 tablet by mouth daily.   mupirocin ointment 2 % Commonly known as: BACTROBAN Apply 1 Application topically at bedtime.   ondansetron 4 MG tablet Commonly known as: ZOFRAN Take 4 mg by mouth as needed for nausea or vomiting.   pantoprazole 40 MG tablet Commonly known as: PROTONIX Take 1 tablet (40 mg total) by mouth 2 (two) times daily. What changed: when to take this   PATADAY OP Place 2 drops into both eyes daily as needed (allergies).   polyethylene glycol powder 17 GM/SCOOP powder Commonly known as: GLYCOLAX/MIRALAX Take 17 g by mouth every other day.   traZODone 50 MG tablet Commonly known as: DESYREL Take 75 mg by mouth at bedtime as needed for sleep.   Evaristo Bury FlexTouch 100 UNIT/ML FlexTouch Pen Generic drug: insulin degludec Inject 45 Units into the skin daily. What changed: how much to take   vitamin C with rose hips 500 MG  tablet Take 1 tablet (500 mg total) by mouth daily.   zinc sulfate (50mg  elemental zinc) 220 (50 Zn) MG capsule Take 1 capsule (220 mg total) by mouth daily.               Discharge Care Instructions  (From admission, onward)           Start     Ordered   12/30/23 0000  Discharge wound care:       Comments: Apply a piece of Aquacel to left foot wound Q day, then cover with foam dressing.  Change foam dressing Q 3 days or PRN soiling   12/30/23 1202           Disposition: SNF Diet recommendation: Cardiac diet  Discharge Exam: Vitals:   12/29/23 1618 12/29/23 1957 12/30/23 0410 12/30/23 0500  BP: (!) 104/51 (!) 124/54 (!) 114/54   Pulse: 77 62 64   Resp: 18 19 18    Temp: 98.1 F (36.7 C) 98.2 F (36.8 C) 98 F (36.7 C)   TempSrc:  Oral Oral   SpO2: 100% 100% 100%   Weight:    102.9 kg  Height:       General: Appear in mild distress; no visible Abnormal Neck Mass Or lumps, Conjunctiva normal Cardiovascular: S1 and S2 Present, no Murmur, Respiratory: good respiratory effort, Bilateral Air entry present and CTA, no Crackles, no wheezes Abdomen: Bowel Sound present, Non tender  Extremities: no Pedal edema, right BKA Neurology: alert and oriented to time, place, and person  Filed Weights   12/28/23 0514 12/29/23 0455 12/30/23 0500  Weight: 104.7 kg 105.7 kg 102.9 kg   Condition at discharge: stable  The results of significant diagnostics from this hospitalization (including imaging, microbiology, ancillary and laboratory) are listed below for reference.   Imaging Studies: CT ABDOMEN PELVIS WO CONTRAST Result Date: 12/30/2023 CLINICAL DATA:  Severe right lower quadrant pain and tenderness. Hypotension. Retroperitoneal hematoma. EXAM: CT ABDOMEN AND PELVIS WITHOUT CONTRAST TECHNIQUE: Multidetector CT imaging of the abdomen and pelvis was performed following the standard protocol without IV contrast. RADIATION DOSE REDUCTION: This exam was performed according to  the departmental dose-optimization program which includes automated exposure control, adjustment of the mA and/or kV according to patient size and/or use of iterative reconstruction technique. COMPARISON:  12/24/2023 FINDINGS: Lower chest: No acute findings. Chronic interstitial lung disease again noted. Hepatobiliary: No mass visualized on this unenhanced exam. Gallbladder is unremarkable. No evidence of biliary ductal dilatation. Pancreas: No mass or inflammatory  process visualized on this unenhanced exam. Spleen:  Within normal limits in size. Adrenals/Urinary tract: No evidence of urolithiasis or hydronephrosis. Unremarkable unopacified urinary bladder. Stomach/Bowel: No evidence of obstruction, inflammatory process, or abnormal fluid collections. Normal appendix visualized. Diverticulosis is seen mainly involving the sigmoid colon, however there is no evidence of diverticulitis. Vascular/Lymphatic: No pathologically enlarged lymph nodes identified. No evidence of abdominal aortic aneurysm. Reproductive:  No mass or other significant abnormality. Other: Retroperitoneal hemorrhage is again seen in the right posterior pararenal space. This measures 11.3 x 7.1 cm, without significant change since prior study. Musculoskeletal:  No suspicious bone lesions identified. IMPRESSION: Stable moderate right retroperitoneal hemorrhage. No acute findings. Colonic diverticulosis, without radiographic evidence of diverticulitis. Electronically Signed   By: Danae Orleans M.D.   On: 12/30/2023 11:02   CT ABDOMEN PELVIS WO CONTRAST Result Date: 12/24/2023 CLINICAL DATA:  Retroperitoneal hematoma, progressive right flank pain EXAM: CT ABDOMEN AND PELVIS WITHOUT CONTRAST TECHNIQUE: Multidetector CT imaging of the abdomen and pelvis was performed following the standard protocol without IV contrast. RADIATION DOSE REDUCTION: This exam was performed according to the departmental dose-optimization program which includes automated  exposure control, adjustment of the mA and/or kV according to patient size and/or use of iterative reconstruction technique. 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 This is not well assessed on this examination. Previously noted pleural effusions have resolved. Status post transcatheter aortic valve replacement. Coronary artery bypass grafting has been performed. Cardiac size is at the upper limits of normal. Hepatobiliary: No focal liver abnormality is seen. No gallstones, gallbladder wall thickening, or biliary dilatation. Pancreas: Unremarkable Spleen: Unremarkable Adrenals/Urinary Tract: The adrenal glands are unremarkable. The right kidney is displaced anteriorly by the retroperitoneal hematoma, however, appears unchanged. The kidneys are otherwise unremarkable on this noncontrast examination. Previously noted Foley catheter has been removed. Punctate focus of nondependent gas within the bladder lumen likely relates recent catheterization. The bladder is otherwise unremarkable. Stomach/Bowel: Stomach is within normal limits. Appendix appears normal. No evidence of bowel wall thickening, distention, or inflammatory changes. Vascular/Lymphatic: Aortic atherosclerosis. No enlarged abdominal or pelvic lymph nodes. Reproductive: Prostate is unremarkable. Other: Right retroperitoneal hematoma is unchanged, measuring roughly 11.2 x 6.0 x 17.0 cm in greatest dimension, arising from the right psoas. No abdominal wall hernia. Dependent subcutaneous body wall edema is unchanged. Musculoskeletal: Osseous structures are age-appropriate. L3 and L4 posterior decompression has been performed. Bilateral L5 pars defects are present without associated  spondylolisthesis. No acute bone abnormality. IMPRESSION: 1. No significant interval change in right retroperitoneal hematoma, arising from the right psoas. 2. Interval resolution of previously noted pleural effusions. 3. 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 Aortic Atherosclerosis (ICD10-I70.0). Electronically Signed   By: Helyn Numbers M.D.   On: 12/24/2023 20:05   DG FEMUR 1V RIGHT Result Date: 12/16/2023 CLINICAL DATA:  99497 Leg pain [id number] EXAM: RIGHT FEMUR 1 VIEW COMPARISON:  None Available. FINDINGS: There is no evidence of fracture or other focal bone lesions. No findings suggest dislocation of the hip or knee. Mild degenerative changes of the hip and knee. Soft tissues are unremarkable. Vascular stent along the expected superficial femoral artery. Vascular calcifications. IMPRESSION: No acute displaced fracture or dislocation with limited evaluation on this single view radiograph. Electronically Signed   By: Normajean Glasgow.D.  On: 12/16/2023 18:23   DG FEMUR 1V LEFT Result Date: 12/16/2023 CLINICAL DATA:  99497 Leg pain [id number] EXAM: LEFT FEMUR 1 VIEW COMPARISON:  None Available. FINDINGS: There is no evidence of fracture or other focal bone lesions. At least mild degenerative changes of the knee and hip. Soft tissues are unremarkable. Vascular calcifications. IMPRESSION: No acute displaced fracture or dislocation with slightly limited evaluation on this single view radiograph. Electronically Signed   By: Tish Frederickson M.D.   On: 12/16/2023 18:14   CT ABDOMEN PELVIS WO CONTRAST Result Date: 12/12/2023 CLINICAL DATA:  Follow-up retroperitoneal bleed EXAM: CT ABDOMEN AND PELVIS WITHOUT CONTRAST TECHNIQUE: Multidetector CT imaging of the abdomen and pelvis was performed following the standard protocol without IV contrast. RADIATION DOSE REDUCTION: This exam was performed according to the departmental dose-optimization program which includes  automated exposure control, adjustment of the mA and/or kV according to patient size and/or use of iterative reconstruction technique. COMPARISON:  CT 12/09/2023 FINDINGS: Lower chest: Lung bases demonstrate increased bilateral pleural effusions. Cardiomegaly with partially visualized valve prosthesis. 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 Hazy edema or infiltrates at the bases. Hepatobiliary: No focal liver abnormality is seen. No gallstones, gallbladder wall thickening, or biliary dilatation. Pancreas: Unremarkable. No pancreatic ductal dilatation or surrounding inflammatory changes. Spleen: Normal in size without focal abnormality. Adrenals/Urinary Tract: Adrenal glands are normal. Kidneys show no hydronephrosis. Delayed bilateral nephrogram. Urinary bladder is decompressed by Foley catheter and contains small amount of air. Slightly thick-walled bladder with mild perivesical stranding Stomach/Bowel: Stomach nonenlarged. No dilated small bowel. No acute bowel wall thickening. Diverticular disease of the left colon. Negative appendix. Vascular/Lymphatic: Moderate aortic atherosclerosis. No aneurysm. No suspicious lymph nodes Reproductive: Negative prostate Other: No pelvic effusion or free air. Redemonstrated right retroperitoneal hematoma with hematocrit level. Hematoma measures slightly larger compared to the prior exam, reference series 3, image 46 with measurements of 8.9 x 6.3 compared with 7.4 x 6.2 cm previously. Hematoma measures about 16.8 cm craniocaudad on sagittal series 7, image 49, previously 14.7 cm. Small volume hemorrhage tracking inferiorly along the right psoas. Generalized subcutaneous edema. Musculoskeletal: No acute osseous abnormality. Chronic bilateral pars defect at L5 IMPRESSION: 1. Redemonstrated large right retroperitoneal hematoma with hematocrit level. Hematoma measures slightly larger compared to the prior exam. 2. Increased bilateral pleural effusions with hazy edema or  infiltrates at the lung bases. Cardiomegaly. 3. Delayed bilateral nephrogram suggesting renal dysfunction. Correlate with appropriate laboratory values. Urinary bladder is decompressed by Foley catheter. Slightly thick-walled bladder with mild perivesical stranding, correlate for cystitis. 4. Aortic atherosclerosis. Aortic Atherosclerosis (ICD10-I70.0). Electronically Signed   By: Jasmine Pang M.D.   On: 12/12/2023 20:42   CT ABDOMEN PELVIS W CONTRAST Result Date: 12/09/2023 CLINICAL DATA:  Right lower quadrant abdominal pain EXAM: CT ABDOMEN AND PELVIS WITH CONTRAST TECHNIQUE: Multidetector CT imaging of the abdomen and pelvis was performed using the standard protocol following bolus administration of intravenous contrast. RADIATION DOSE REDUCTION: This exam was performed according to the departmental dose-optimization program which includes automated exposure control, adjustment of the mA and/or kV according to patient size and/or use of iterative reconstruction technique. CONTRAST:  75mL OMNIPAQUE IOHEXOL 350 MG/ML SOLN COMPARISON:  CT abdomen and pelvis 04/27/2018 FINDINGS: Lower chest: Peribronchovascular and peripheral ground-glass and reticular opacities compatible with interstitial lung disease. Small bilateral pleural effusions. Coronary artery atherosclerosis.  TAVR. Hepatobiliary: No acute abnormality. Pancreas: Unremarkable. Spleen: Unremarkable. Adrenals/Urinary Tract: Normal adrenal glands. No urinary calculi or hydronephrosis. Foley catheter and gas within the decompressed  bladder. Stomach/Bowel: Normal caliber large and small bowel. No bowel wall thickening. Colonic diverticulosis without diverticulitis. Stomach and appendix are within normal limits. Vascular/Lymphatic: Right retroperitoneal hematoma measures 7.4 x 6.2 x 10.2 cm. Foci of hyperintensity on initial phase imaging (series 3/image 58-60) with diffusion on delayed phase images compatible with active hemorrhage. Blood tracks inferiorly  along the right psoas. Reproductive: No acute abnormality. Other: No free intraperitoneal air. Musculoskeletal: No acute fracture. Chronic bilateral L5 pars defects. IMPRESSION: 1. Right retroperitoneal hematoma with focus of active hemorrhage. 2. Chronic interstitial lung disease. Small bilateral pleural effusions. Superimposed edema or infection is difficult to exclude. These results were called by telephone at the time of interpretation on 12/09/2023 at 3:53 am to provider ADITYA PALIWAL , who verbally acknowledged these results. Electronically Signed   By: Minerva Fester M.D.   On: 12/09/2023 03:54   ECHOCARDIOGRAM COMPLETE Result Date: 12/07/2023    ECHOCARDIOGRAM REPORT   Patient Name:   EDDIE PAYETTE Date of Exam: 12/07/2023 Medical Rec #:  [id number]        Height:       72.0 in Accession #:    [id number]       Weight:       241.2 lb Date of Birth:  1941/10/14        BSA:          2.307 m Patient Age:    82 years         BP:           120/54 mmHg Patient Gender: M                HR:           75 bpm. Exam Location:  Inpatient Procedure: 2D Echo, Color Doppler, Cardiac Doppler and Intracardiac            Opacification Agent STAT ECHO Indications:    NSTEMI  History:        Patient has prior history of Echocardiogram examinations, most                 recent 06/22/2023. CHF, Pacemaker and Prior CABG, chronic kidney                 disease. Covid., Arrythmias:RBBB; Risk Factors:Sleep Apnea,                 Diabetes and Hypertension.                 Aortic Valve: 29 mm Edwards Sapien prosthetic, stented (TAVR)                 valve is present in the aortic position. Procedure Date:                 08/16/23.  Sonographer:    Delcie Roch RDCS Referring Phys: [id number] Henrene Hawking  Sonographer Comments: Technically difficult study due to poor echo windows. Image acquisition challenging due to patient body habitus. IMPRESSIONS  1. Left ventricular ejection fraction, by estimation, is 40%. The left ventricle  has mild to moderately decreased function. The left ventricle demonstrates regional wall motion abnormalities with septal and inferior severe hypokinesis. Even with Definity contrast, images were poor. Left ventricular diastolic parameters are indeterminate.  2. Peak RV-RA gradient 33 mmHg. IVC not visualized. Right ventricular systolic function is moderately reduced. The right ventricular size is normal.  3. Left atrial size was moderately dilated.  4. Right atrial size was mildly dilated.  5. The mitral valve is normal in structure. Mild to moderate mitral valve regurgitation. No evidence of mitral stenosis.  6. Bioprosthetic aortic valve s/p TAVR with 29 mm Edwards Sapien THV. Mean gradient 8 mmHg, EOA 1.57 cm^2 with dimensionless index 0.46. No peri-valvular leakage noted. The aortic valve has been repaired/replaced. Aortic valve regurgitation is not visualized.  7. Aortic dilatation noted. There is mild dilatation of the ascending aorta, measuring 39 mm. FINDINGS  Left Ventricle: Left ventricular ejection fraction, by estimation, is 40%. The left ventricle has mild to moderately decreased function. The left ventricle demonstrates regional wall motion abnormalities. Definity contrast agent was given IV to delineate the left ventricular endocardial borders. The left ventricular internal cavity size was normal in size. There is no left ventricular hypertrophy. Left ventricular diastolic parameters are indeterminate. Right Ventricle: Peak RV-RA gradient 33 mmHg. IVC not visualized. The right ventricular size is normal. No increase in right ventricular wall thickness. Right ventricular systolic function is moderately reduced. Left Atrium: Left atrial size was moderately dilated. Right Atrium: Right atrial size was mildly dilated. Pericardium: There is no evidence of pericardial effusion. Mitral Valve: The mitral valve is normal in structure. Mild mitral annular calcification. Mild to moderate mitral valve  regurgitation. No evidence of mitral valve stenosis. Tricuspid Valve: The tricuspid valve is normal in structure. Tricuspid valve regurgitation is trivial. Aortic Valve: Bioprosthetic aortic valve s/p TAVR with 29 mm Edwards Sapien THV. Mean gradient 8 mmHg, EOA 1.57 cm^2 with dimensionless index 0.46. No peri-valvular leakage noted. The aortic valve has been repaired/replaced. Aortic valve regurgitation is  not visualized. Aortic valve mean gradient measures 8.0 mmHg. Aortic valve peak gradient measures 13.8 mmHg. Aortic valve area, by VTI measures 1.57 cm. There is a 29 mm Edwards Sapien prosthetic, stented (TAVR) valve present in the aortic position. Procedure Date: 08/16/23. Pulmonic Valve: The pulmonic valve was normal in structure. Pulmonic valve regurgitation is trivial. Aorta: Aortic dilatation noted. There is mild dilatation of the ascending aorta, measuring 39 mm. IAS/Shunts: No atrial level shunt detected by color flow Doppler.  LEFT VENTRICLE PLAX 2D LVIDd:         5.40 cm LVIDs:         4.00 cm LV PW:         1.10 cm LV IVS:        1.10 cm LVOT diam:     2.20 cm LV SV:         64 LV SV Index:   28 LVOT Area:     3.80 cm  RIGHT VENTRICLE RV Basal diam:  2.90 cm RV S prime:     6.64 cm/s TAPSE (M-mode): 0.9 cm LEFT ATRIUM              Index        RIGHT ATRIUM           Index LA diam:        4.80 cm  2.08 cm/m   RA Area:     21.30 cm LA Vol (A2C):   110.0 ml 47.68 ml/m  RA Volume:   58.90 ml  25.53 ml/m LA Vol (A4C):   117.0 ml 50.71 ml/m LA Biplane Vol: 121.0 ml 52.45 ml/m  AORTIC VALVE AV Area (Vmax):    1.59 cm AV Area (Vmean):   1.51 cm AV Area (VTI):     1.57 cm AV Vmax:           186.00 cm/s AV Vmean:  127.000 cm/s AV VTI:            0.410 m AV Peak Grad:      13.8 mmHg AV Mean Grad:      8.0 mmHg LVOT Vmax:         77.80 cm/s LVOT Vmean:        50.450 cm/s LVOT VTI:          0.169 m LVOT/AV VTI ratio: 0.41  AORTA Ao Root diam: 3.80 cm Ao Asc diam:  3.90 cm TRICUSPID VALVE TR Peak  grad:   32.9 mmHg TR Vmax:        287.00 cm/s  SHUNTS Systemic VTI:  0.17 m Systemic Diam: 2.20 cm Dalton McleanMD Electronically signed by Wilfred Lacy Signature Date/Time: 12/07/2023/9:49:35 AM    Final    DG CHEST PORT 1 VIEW Result Date: 12/07/2023 CLINICAL DATA:  Central line placement. EXAM: PORTABLE CHEST 1 VIEW COMPARISON:  Radiograph yesterday. FINDINGS: Right internal jugular central venous catheter tip overlies the upper SVC. No pneumothorax. Median sternotomy. Stable heart size and mediastinal contours. TAVR and lead less pacemaker. Pulmonary edema and small pleural effusions. IMPRESSION: 1. Right internal jugular central venous catheter tip overlies the upper SVC. No pneumothorax. 2. Pulmonary edema and small pleural effusions. Electronically Signed   By: Narda Rutherford M.D.   On: 12/07/2023 03:41   US RENAL Result Date: 12/06/2023 CLINICAL DATA:  Acute renal insufficiency EXAM: RENAL / URINARY TRACT ULTRASOUND COMPLETE COMPARISON:  04/27/2018 FINDINGS: Right Kidney: Renal measurements: 11.8 x 5.2 x 4.3 cm = volume: 137.2 mL. Echogenicity within normal limits. No mass or hydronephrosis visualized. Left Kidney: Renal measurements: 11.7 x 5.6 x 4.5 cm = volume: 154.4 mL. Echogenicity within normal limits. No mass or hydronephrosis visualized. Bladder: Appears normal for degree of bladder distention. Other: None. IMPRESSION: 1. Unremarkable renal ultrasound. Electronically Signed   By: Sharlet Salina M.D.   On: 12/06/2023 22:10   DG Chest Port 1 View Result Date: 12/06/2023 CLINICAL DATA:  SOB. EXAM: PORTABLE CHEST - 1 VIEW COMPARISON:  06/21/2023. FINDINGS: Cardiac silhouette is prominent. There is pulmonary interstitial prominence with vascular congestion. No focal consolidation. No pneumothorax. There are bilateral pleural effusions. 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 IMPRESSION: Findings suggest CHF. Electronically Signed   By: Layla Maw M.D.    On: 12/06/2023 19:13    Microbiology: Results for orders placed or performed during the hospital encounter of 12/06/23  Resp panel by RT-PCR (RSV, Flu A&B, Covid) Anterior Nasal Swab     Status: Abnormal   Collection Time: 12/06/23  6:12 PM   Specimen: Anterior Nasal Swab  Result Value Ref Range Status   SARS Coronavirus 2 by RT PCR POSITIVE (A) NEGATIVE Final   Influenza A by PCR NEGATIVE NEGATIVE Final   Influenza B by PCR NEGATIVE NEGATIVE Final    Comment: (NOTE) The Xpert Xpress SARS-CoV-2/FLU/RSV plus assay is intended as an aid in the diagnosis of influenza from Nasopharyngeal swab specimens and should not be used as a sole basis for treatment. Nasal washings and aspirates are unacceptable for Xpert Xpress SARS-CoV-2/FLU/RSV testing.  Fact Sheet for Patients: BloggerCourse.com  Fact Sheet for Healthcare Providers: SeriousBroker.it  This test is not yet approved or cleared by the [place] FDA and has been authorized for detection and/or diagnosis of SARS-CoV-2 by FDA under an Emergency Use Authorization (EUA). This EUA will remain in effect (meaning this test can be used) for the duration of  the COVID-19 declaration under Section 564(b)(1) of the Act, 21 U.S.C. section 360bbb-3(b)(1), unless the authorization is terminated or revoked.     Resp Syncytial Virus by PCR NEGATIVE NEGATIVE Final    Comment: (NOTE) Fact Sheet for Patients: BloggerCourse.com  Fact Sheet for Healthcare Providers: SeriousBroker.it  This test is not yet approved or cleared by the [place] FDA and has been authorized for detection and/or diagnosis of SARS-CoV-2 by FDA under an Emergency Use Authorization (EUA). This EUA will remain in effect (meaning this test can be used) for the duration of the COVID-19 declaration under Section 564(b)(1) of the Act, 21 U.S.C. section 360bbb-3(b)(1),  unless the authorization is terminated or revoked.  Performed at [facility], [address], [place], [place] [address]   Blood Culture (routine x 2)     Status: None   Collection Time: 12/06/23  6:30 PM   Specimen: BLOOD  Result Value Ref Range Status   Specimen Description BLOOD SITE NOT SPECIFIED  Final   Special Requests   Final    BOTTLES DRAWN AEROBIC AND ANAEROBIC Blood Culture results may not be optimal due to an inadequate volume of blood received in culture bottles   Culture   Final    NO GROWTH 5 DAYS Performed at [facility], [address], [place], [place] [address]    Report Status 12/11/2023 FINAL  Final  Blood Culture (routine x 2)     Status: None   Collection Time: 12/06/23  6:40 PM   Specimen: BLOOD  Result Value Ref Range Status   Specimen Description BLOOD SITE NOT SPECIFIED  Final   Special Requests   Final    BOTTLES DRAWN AEROBIC AND ANAEROBIC Blood Culture results may not be optimal due to an inadequate volume of blood received in culture bottles   Culture   Final    NO GROWTH 5 DAYS Performed at [facility], [address] [place], [place] [address]    Report Status 12/11/2023 FINAL  Final  MRSA Next Gen by PCR, Nasal     Status: None   Collection Time: 12/06/23 11:50 PM   Specimen: Nasal Mucosa; Nasal Swab  Result Value Ref Range Status   MRSA by PCR Next Gen NOT DETECTED NOT DETECTED Final    Comment: (NOTE) The GeneXpert MRSA Assay (FDA approved for NASAL specimens only), is one component of a comprehensive MRSA colonization surveillance program. It is not intended to diagnose MRSA infection nor to guide or monitor treatment for MRSA infections. Test performance is not FDA approved in patients less than 101 years old. Performed at [facility], [address], [place], [place] [address]    *Note: Due to a large number of results and/or encounters for the requested time period, some results have not been displayed.  A complete set of results can be found in Results Review.   Labs: CBC: Recent Labs  Lab 12/24/23 1825 12/25/23 0602 12/26/23 0557 12/27/23 0614 12/29/23 1429  WBC  --  8.4 6.4 6.0 6.5  NEUTROABS  --  6.1  --   --  4.7  HGB 10.5* 9.5* 9.9* 9.8* 10.9*  HCT 33.0* 29.8* 31.1* 30.4* 34.7*  MCV  --  89.8 90.7 90.5 90.4  PLT  --  150 164 177 229   Basic Metabolic Panel: Recent Labs  Lab 12/26/23 0557 12/27/23 0614 12/27/23 1747 12/28/23 0710 12/29/23 0703 12/30/23 0742  NA 131* 128* 130* 131* 135 135  K 4.9 5.0 4.4 4.2 4.1 4.4  CL 90* 88* 90* 89* 92* 89*  CO2 33* 32 33* 33* 34* 34*  GLUCOSE 249* 218* 239* 152* 135* 119*  BUN 54* 55* 57* 51* 45* 43*  CREATININE 1.49* 1.39* 1.34* 1.39* 1.60* 1.40*  CALCIUM 9.1 8.9 8.9 8.7* 8.7* 8.9  MG 1.6* 1.9  --  1.6* 1.9 1.8  PHOS 5.1* 5.1* 4.4  --   --   --    Liver Function Tests: Recent Labs  Lab 12/27/23 1747  ALBUMIN 2.3*   CBG: Recent Labs  Lab 12/29/23 0839 12/29/23 1204 12/29/23 1621 12/29/23 1959 12/30/23 0750  GLUCAP 216* 216* 196* 163* 132*    Discharge time spent: greater than 30 minutes.  Author: Lynden Oxford, MD  [facility]

## 2024-01-01 ENCOUNTER — Ambulatory Visit: Payer: Medicare Other | Admitting: Orthopedic Surgery

## 2024-01-04 DIAGNOSIS — J9621 Acute and chronic respiratory failure with hypoxia: Secondary | ICD-10-CM | POA: Diagnosis not present

## 2024-01-04 DIAGNOSIS — E1169 Type 2 diabetes mellitus with other specified complication: Secondary | ICD-10-CM | POA: Diagnosis not present

## 2024-01-04 DIAGNOSIS — I480 Paroxysmal atrial fibrillation: Secondary | ICD-10-CM | POA: Diagnosis not present

## 2024-01-18 ENCOUNTER — Ambulatory Visit: Payer: Medicare Other | Admitting: Orthopedic Surgery

## 2024-01-25 ENCOUNTER — Ambulatory Visit: Payer: Medicare Other | Admitting: Orthopedic Surgery

## 2024-01-25 DIAGNOSIS — L97521 Non-pressure chronic ulcer of other part of left foot limited to breakdown of skin: Secondary | ICD-10-CM | POA: Diagnosis not present

## 2024-01-25 DIAGNOSIS — Z89511 Acquired absence of right leg below knee: Secondary | ICD-10-CM | POA: Diagnosis not present

## 2024-01-25 DIAGNOSIS — Z89439 Acquired absence of unspecified foot: Secondary | ICD-10-CM

## 2024-01-26 ENCOUNTER — Encounter: Payer: Self-pay | Admitting: Orthopedic Surgery

## 2024-01-26 NOTE — Progress Notes (Signed)
Office Visit Note   Patient: Shane [name]           Date of Birth: December 04, 1941           MRN: [id number] Visit Date: 01/25/2024              Requested by: [name], MD [address] [place],  [place] [address] PCP: [name], MD  Chief Complaint  Patient presents with   Other    Follow up wound on LLE with history of left transmet amp      HPI: Patient is an 83 year old gentleman who is seen for evaluation for both lower extremities.  Patient is status post left transmetatarsal amputation with a nonhealing ulcer.  Patient is status post a right transtibial amputation.  Patient states he is recently been hospitalized and in a skilled nursing facility for COVID related kidney failure.  Patient states he was at [facility] for 3 weeks and [facility] skilled nursing for 2 weeks.  Assessment & Plan: Visit Diagnoses:  1. Hx of right BKA (HCC)   2. History of transmetatarsal amputation of foot (HCC)   3. Non-pressure chronic ulcer of other part of left foot limited to breakdown of skin (HCC)     Plan: Continue Vashe dressing changes.  Minimize weightbearing on the left foot.  Recommended use the nitroglycerin patch daily applied to a new location dorsum of the foot.  Follow-Up Instructions: Return in about 2 weeks (around 02/08/2024).   Ortho Exam  Patient is alert, oriented, no adenopathy, well-dressed, normal affect, normal respiratory effort. Examination arterial duplex studies on the left show adequate pressures to the left lower extremity without focal stenosis.  The wound bed measures 10 x 25 mm and is 3 mm deep.  There is fibrinous tissue that was debrided there is good healthy tissue without tunneling without undermining.  Imaging: No results found.   Labs: Lab Results  Component Value Date   HGBA1C 8.5 (H) 12/06/2023   HGBA1C 9.0 (H) 06/24/2023   HGBA1C 7.8 (H) 02/15/2021   ESRSEDRATE 84 (H) 12/29/2023   ESRSEDRATE 22 (H) 10/14/2016   CRP 3.5 (H)  12/29/2023   CRP 9.0 (H) 12/08/2023   CRP 8.3 (H) 12/09/2020   REPTSTATUS 12/11/2023 FINAL 12/06/2023   GRAMSTAIN  02/19/2021    RARE WBC PRESENT, PREDOMINANTLY PMN NO ORGANISMS SEEN    CULT  12/06/2023    NO GROWTH 5 DAYS Performed at [facility], [address], [place], [place] [address]    [facility] ESCHERICHIA COLI 02/19/2021     Lab Results  Component Value Date   ALBUMIN 2.3 (L) 12/27/2023   ALBUMIN 2.5 (L) 12/20/2023   ALBUMIN 2.5 (L) 12/18/2023    Lab Results  Component Value Date   MG 1.8 12/30/2023   MG 1.9 12/29/2023   MG 1.6 (L) 12/28/2023   Lab Results  Component Value Date   VD25OH 32.29 12/14/2022    No results found for: "PREALBUMIN"    Latest Ref Rng & Units 12/29/2023    2:29 PM 12/27/2023    6:14 AM 12/26/2023    5:57 AM  CBC EXTENDED  WBC 4.0 - 10.5 K/uL 6.5  6.0  6.4   RBC 4.22 - 5.81 MIL/uL 3.84  3.36  3.43   Hemoglobin 13.0 - 17.0 g/dL 62.1  9.8  9.9   HCT 30.8 - 52.0 % 34.7  30.4  31.1   Platelets 150 - 400 K/uL 229  177  164  NEUT# 1.7 - 7.7 K/uL 4.7     Lymph# 0.7 - 4.0 K/uL 0.8        There is no height or weight on file to calculate BMI.  Orders:  No orders of the defined types were placed in this encounter.  No orders of the defined types were placed in this encounter.    Procedures: No procedures performed  Clinical Data: No additional findings.  ROS:  All other systems negative, except as noted in the HPI. Review of Systems  Objective: Vital Signs: There were no vitals taken for this visit.  Specialty Comments:  No specialty comments available.  PMFS History: Patient Active Problem List   Diagnosis Date Noted   Non-pressure chronic ulcer of other part of left foot limited to breakdown of skin (HCC) 12/27/2023   Type 2 diabetes mellitus without complication, with long-term current use of insulin (HCC) 12/12/2023   Esophageal dysphagia 12/11/2023   COVID-19 12/11/2023   Acute on chronic hypoxic  respiratory failure (HCC) 12/10/2023   Anemia 12/10/2023   Non-ST elevation (NSTEMI) myocardial infarction (HCC) 12/09/2023   Retroperitoneal bleeding 12/09/2023   History of transcatheter aortic valve replacement (TAVR) 12/09/2023   Chronic heart failure with preserved ejection fraction (HFpEF) (HCC) 12/09/2023   Hx of CABG 12/09/2023   Atherosclerosis of native coronary artery of native heart without angina pectoris 12/09/2023   Hyperkalemia 12/07/2023   Acute on chronic congestive heart failure (HCC) 12/07/2023   Acute kidney injury (nontraumatic) (HCC) 12/06/2023   Acute on chronic combined systolic and diastolic CHF, NYHA class 1 (HCC) 06/22/2023   Acute on chronic systolic (congestive) heart failure (HCC) 06/21/2023   Intermediate stage nonexudative age-related macular degeneration of both eyes 04/25/2022   Research study patient 12/24/2021   Labile blood glucose    Pulmonary fibrosis (HCC)    Hyponatremia    S/P BKA (below knee amputation) unilateral, right (HCC)    Shortness of breath    Atrial fibrillation (HCC)    Postoperative pain    Atherosclerosis of native arteries of extremities with gangrene, right leg (HCC)    Hardware complicating wound infection (HCC)    Abscess of bursa of right ankle 02/15/2021   Endocarditis 01/19/2021   PAD (peripheral artery disease) (HCC) 12/29/2020   Chronic systolic CHF (congestive heart failure) (HCC) 12/22/2020   History of COVID-19 12/22/2020   SSS (sick sinus syndrome) (HCC) 12/22/2020   Acute bacterial endocarditis    MSSA bacteremia 12/09/2020   Diabetic foot ulcer (HCC) 12/09/2020   Foot drop, right foot    Generalized weakness 12/08/2020   Atrial fibrillation, chronic (HCC) 12/08/2020   Elevated troponin level not due myocardial infarction 12/08/2020   Adrenal insufficiency (HCC) 11/14/2020   Orthostatic hypotension 11/14/2020   Physical deconditioning 11/14/2020   Difficulty sleeping 11/07/2020   Right ventricular  dysfunction 11/05/2020   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/04/2020   Hearing loss 11/03/2020   Cardiac device in situ 09/09/2020   COVID-19 virus infection 07/18/2020   Coronary artery disease involving native heart without angina pectoris 07/10/2020   Chronic kidney disease, stage 3a (HCC) 03/29/2020   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/28/2020   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) 03/16/2020   Right epiretinal membrane 03/16/2020   Posterior vitreous detachment of right eye 03/16/2020   Aortic stenosis, moderate 03/12/2020   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   Near syncope 04/27/2018   Hypoglycemia due to insulin 01/06/2018  Essential hypertension 01/06/2018   Syncope and collapse 01/05/2018   Encounter for therapeutic drug monitoring 06/29/2017   OSA on CPAP 05/05/2017   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HCC) 01/05/2017   Abnormal chest x-ray 10/12/2016   Benign neoplasm of colon 01/14/2013   Past Medical History:  Diagnosis Date   Acute blood loss anemia    Anxiety    AV block, Mobitz 1    Cataract    Chronic kidney disease    d/t DM   CKD (chronic kidney disease), stage III (HCC)    COVID-19 virus infection 07/18/2020   Last Assessment & Plan:   Formatting of this note might be different from the original.  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 10/2019 Echo: EF 55-60%, Gr2 DD. Nl RV.    Neuromuscular disorder (HCC)    Neuropathy associated with endocrine  disorder (HCC)    Nonobstructive CAD (coronary artery disease)    a. 2012 Cath: mod, nonobs dzs; b. 10/2016 MV: EF 60%, no ischemia.   OSA on CPAP 05/05/2017   Unattended Home Sleep Test 7/2/813-AHI 38.6/hour, desaturation to 64%, body weight 261 pounds   PONV (postoperative nausea and vomiting)    Postoperative anemia due to acute blood loss 11/07/2020   Postoperative hemorrhagic shock 03/20/2021   Sleep apnea     uses cpap asked to bring mask and tubing    Family History  Problem Relation Age of Onset   Diabetes Mellitus II Mother    Emphysema Father 21   Heart attack Father    Colon cancer Neg Hx    Esophageal cancer Neg Hx    Rectal cancer Neg Hx    Stomach cancer Neg Hx     Past Surgical History:  Procedure Laterality Date   ABDOMINAL AORTOGRAM W/LOWER EXTREMITY N/A 12/10/2020   Procedure: ABDOMINAL AORTOGRAM W/LOWER EXTREMITY;  Surgeon: Leonie Douglas, MD;  Location: [facility] INVASIVE CV LAB;  Service: Cardiovascular;  Laterality: N/A;   AMPUTATION Right 01/22/2021   Procedure: 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;  Surgeon: [name], MD;  Location[facility] OR;  Service: Orthopedics;  Laterality: Right;   AMPUTATION Right 03/17/2021   Procedure: RIGHT BELOW KNEE AMPUTATION;  Surgeon: [name], MD;  Location[facility] OR;  Service: Orthopedics;  Laterality: Right;   ANKLE FUSION Right 01/22/2021   Procedure: RIGHT FOOT TIBIOCALCANEAL FUSION;  Surgeon: [name], MD;  Location: [facility] OR;  Service: Orthopedics;  Laterality: Right;   ANTERIOR FUSION CERVICAL SPINE  2012   CARDIAC CATHETERIZATION  2011   CARDIAC CATHETERIZATION N/A 11/09/2016   Procedure: Right Heart Cath;  Surgeon: Lyn Records, MD;  Location: [facility] INVASIVE CV LAB;  Service: Cardiovascular;  Laterality: N/A;   carpel tunnel     left wrist   CATARACT EXTRACTION     CATARACT EXTRACTION W/ INTRAOCULAR LENS  IMPLANT, BILATERAL  2013   CERVICAL LAMINECTOMY  2012   COLONOSCOPY N/A 01/14/2013   Procedure:  COLONOSCOPY;  Surgeon: Hilarie Fredrickson, MD;  Location: [facility];  Service: Endoscopy;  Laterality: N/A;   CORONARY ARTERY BYPASS GRAFT  11/04/2020   LIMA-LAD, SVG-OM1, SVG-PDA (Dr Rochele Pages [facility]) dc 11/18/2020   EYE SURGERY     I & D EXTREMITY Right 02/19/2021   Procedure: RIGHT ANKLE DEBRIDEMENT AND PLACEMENT ANTIBIOTIC BEADS;  Surgeon: [name], MD;  Location: [facility] OR;  Service: Orthopedics;  Laterality: Right;   KNEE SURGERY  1998   left   LEFT HEART CATH AND CORONARY ANGIOGRAPHY N/A 07/10/2020   Procedure: LEFT HEART CATH AND CORONARY ANGIOGRAPHY;  Surgeon: Tonny Bollman, MD;  Location: [facility] INVASIVE CV LAB;  Service: Cardiovascular;  Laterality: N/A;   LUMBAR LAMINECTOMY  2003   LUNG BIOPSY Left 12/26/2016   Procedure: LUNG BIOPSY;  Surgeon: Loreli Slot, MD;  Location: [facility] OR;  Service: Thoracic;  Laterality: Left;   PACEMAKER IMPLANT N/A 03/30/2020   Procedure: PACEMAKER IMPLANT;  Surgeon: Marinus Maw, MD;  Location: [facility] INVASIVE CV LAB;  Service: Cardiovascular;  Laterality: N/A;   PERIPHERAL VASCULAR INTERVENTION Right 12/10/2020   Procedure: PERIPHERAL VASCULAR INTERVENTION;  Surgeon: Leonie Douglas, MD;  Location: [facility]VAS[facility] LAB;  Service: Cardiovascular;  Laterality: Right;  SFA   POSTERIOR FUSION CERVICAL SPINE  2012   PPM GENERATOR REMOVAL N/A 12/14/2020   Procedure: PPM GENERATOR REMOVAL;  Surgeon: Marinus Maw, MD;  Location: [facility] INVASIVE CV LAB;  Service: Cardiovascular;  Laterality: N/A;   TEE WITHOUT CARDIOVERSION N/A 12/11/2020   Procedure: TRANSESOPHAGEAL ECHOCARDIOGRAM (TEE);  Surgeon: Sande Rives, MD;  Location: [facility] ENDOSCOPY;  Service: Cardiovascular;  Laterality: N/A;   TRIGGER FINGER RELEASE  2011   4th finger left hand   VIDEO ASSISTED THORACOSCOPY Left 12/26/2016   Procedure: VIDEO ASSISTED THORACOSCOPY;  Surgeon: Loreli Slot, MD;  Location: [facility] OR;  Service: Thoracic;  Laterality: Left;   VIDEO BRONCHOSCOPY N/A 12/26/2016   Procedure: VIDEO BRONCHOSCOPY;   Surgeon: Loreli Slot, MD;  Location: [facility] OR;  Service: Thoracic;  Laterality: N/A;   Social History   Occupational History   Not on file  Tobacco Use   Smoking status: Never   Smokeless tobacco: Never  Vaping Use   Vaping status: Never Used  Substance and Sexual Activity   Alcohol use: No   Drug use: No   Sexual activity: Not Currently

## 2024-01-29 ENCOUNTER — Ambulatory Visit: Payer: Medicare Other | Admitting: Physician Assistant

## 2024-02-08 ENCOUNTER — Ambulatory Visit: Payer: Medicare Other | Admitting: Orthopedic Surgery

## 2024-02-08 DIAGNOSIS — Z89439 Acquired absence of unspecified foot: Secondary | ICD-10-CM

## 2024-02-08 DIAGNOSIS — Z89511 Acquired absence of right leg below knee: Secondary | ICD-10-CM

## 2024-02-09 ENCOUNTER — Encounter: Payer: Self-pay | Admitting: Orthopedic Surgery

## 2024-02-09 NOTE — Progress Notes (Signed)
 Office Visit Note   Patient: Shane [name]           Date of Birth: [birth year]           MRN: [id number] Visit Date: 02/08/2024              Requested by: [name], MD [address] [place],  [place] [address] PCP: [name], MD  Chief Complaint  Patient presents with   Left Foot - Follow-up    Hx left TMA      HPI: Patient is a 83 year old gentleman is seen in follow-up for persistent ulceration left transmetatarsal amputation.  He is using a nitroglycerin patch shrinkers and dressing changes.  Assessment & Plan: Visit Diagnoses:  1. History of transmetatarsal amputation of foot (HCC)   2. Hx of right BKA (HCC)     Plan: Recommended continue the Vashe continue compression.  Recommended knee-high compression sock.  Follow-Up Instructions: Return in about 4 weeks (around 03/07/2024).   Ortho Exam  Patient is alert, oriented, no adenopathy, well-dressed, normal affect, normal respiratory effort. Patient still has persistent venous swelling.  The ulcer has 100% healthy granulation tissue it is 2 x 1 cm and 5 mm deep.  There is no exposed bone or tendon there is no surrounding cellulitis.  There is no tunneling.  Imaging: No results found.   Labs: Lab Results  Component Value Date   HGBA1C 8.5 (H) 12/06/2023   HGBA1C 9.0 (H) 06/24/2023   HGBA1C 7.8 (H) 02/15/2021   ESRSEDRATE 84 (H) 12/29/2023   ESRSEDRATE 22 (H) 10/14/2016   CRP 3.5 (H) 12/29/2023   CRP 9.0 (H) 12/08/2023   CRP 8.3 (H) 12/09/2020   REPTSTATUS 12/11/2023 FINAL 12/06/2023   GRAMSTAIN  02/19/2021    RARE WBC PRESENT, PREDOMINANTLY PMN NO ORGANISMS SEEN    CULT  12/06/2023    NO GROWTH 5 DAYS Performed at [facility], [address] [place], [place] [address]    [facility] ESCHERICHIA COLI 02/19/2021     Lab Results  Component Value Date   ALBUMIN 2.3 (L) 12/27/2023   ALBUMIN 2.5 (L) 12/20/2023   ALBUMIN 2.5 (L) 12/18/2023    Lab Results  Component Value  Date   MG 1.8 12/30/2023   MG 1.9 12/29/2023   MG 1.6 (L) 12/28/2023   Lab Results  Component Value Date   VD25OH 32.29 12/14/2022    No results found for: "PREALBUMIN"    Latest Ref Rng & Units 12/29/2023    2:29 PM 12/27/2023    6:14 AM 12/26/2023    5:57 AM  CBC EXTENDED  WBC 4.0 - 10.5 K/uL 6.5  6.0  6.4   RBC 4.22 - 5.81 MIL/uL 3.84  3.36  3.43   Hemoglobin 13.0 - 17.0 g/dL 86.5  9.8  9.9   HCT 78.4 - 52.0 % 34.7  30.4  31.1   Platelets 150 - 400 K/uL 229  177  164   NEUT# 1.7 - 7.7 K/uL 4.7     Lymph# 0.7 - 4.0 K/uL 0.8        There is no height or weight on file to calculate BMI.  Orders:  No orders of the defined types were placed in this encounter.  No orders of the defined types were placed in this encounter.    Procedures: No procedures performed  Clinical Data: No additional findings.  ROS:  All other systems negative, except as noted in the HPI. Review of Systems  Objective: Vital Signs: There were no vitals taken for this visit.  Specialty Comments:  No specialty comments available.  PMFS History: Patient Active Problem List   Diagnosis Date Noted   Non-pressure chronic ulcer of other part of left foot limited to breakdown of skin (HCC) 12/27/2023   Type 2 diabetes mellitus without complication, with long-term current use of insulin (HCC) 12/12/2023   Esophageal dysphagia 12/11/2023   COVID-19 12/11/2023   Acute on chronic hypoxic respiratory failure (HCC) 12/10/2023   Anemia 12/10/2023   Non-ST elevation (NSTEMI) myocardial infarction (HCC) 12/09/2023   Retroperitoneal bleeding 12/09/2023   History of transcatheter aortic valve replacement (TAVR) 12/09/2023   Chronic heart failure with preserved ejection fraction (HFpEF) (HCC) 12/09/2023   Hx of CABG 12/09/2023   Atherosclerosis of native coronary artery of native heart without angina pectoris 12/09/2023   Hyperkalemia 12/07/2023   Acute on chronic congestive heart failure (HCC)  12/07/2023   Acute kidney injury (nontraumatic) (HCC) 12/06/2023   Acute on chronic combined systolic and diastolic CHF, NYHA class 1 (HCC) 06/22/2023   Acute on chronic systolic (congestive) heart failure (HCC) 06/21/2023   Intermediate stage nonexudative age-related macular degeneration of both eyes 04/25/2022   Research study patient 12/24/2021   Labile blood glucose    Pulmonary fibrosis (HCC)    Hyponatremia    S/P BKA (below knee amputation) unilateral, right (HCC)    Shortness of breath    Atrial fibrillation (HCC)    Postoperative pain    Atherosclerosis of native arteries of extremities with gangrene, right leg (HCC)    Hardware complicating wound infection (HCC)    Abscess of bursa of right ankle 02/15/2021   Endocarditis 01/19/2021   PAD (peripheral artery disease) (HCC) 12/29/2020   Chronic systolic CHF (congestive heart failure) (HCC) 12/22/2020   History of COVID-19 12/22/2020   SSS (sick sinus syndrome) (HCC) 12/22/2020   Acute bacterial endocarditis    MSSA bacteremia 12/09/2020   Diabetic foot ulcer (HCC) 12/09/2020   Foot drop, right foot    Generalized weakness 12/08/2020   Atrial fibrillation, chronic (HCC) 12/08/2020   Elevated troponin level not due myocardial infarction 12/08/2020   Adrenal insufficiency (HCC) 11/14/2020   Orthostatic hypotension 11/14/2020   Physical deconditioning 11/14/2020   Difficulty sleeping 11/07/2020   Right ventricular dysfunction 11/05/2020   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/04/2020   Hearing loss 11/03/2020   Cardiac device in situ 09/09/2020   COVID-19 virus infection 07/18/2020   Coronary artery disease involving native heart without angina pectoris 07/10/2020   Chronic kidney disease, stage 3a (HCC) 03/29/2020   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/28/2020   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) 03/16/2020   Right epiretinal membrane 03/16/2020   Posterior vitreous detachment of right eye  03/16/2020   Aortic stenosis, moderate 03/12/2020   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   Near syncope 04/27/2018   Hypoglycemia due to insulin 01/06/2018   Essential hypertension 01/06/2018   Syncope and collapse 01/05/2018   Encounter for therapeutic drug monitoring 06/29/2017   OSA on CPAP 05/05/2017   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HCC) 01/05/2017   Abnormal chest x-ray 10/12/2016   Benign neoplasm of colon 01/14/2013   Past Medical History:  Diagnosis Date   Acute blood loss anemia    Anxiety    AV block, Mobitz 1    Cataract    Chronic kidney disease    d/t DM   CKD (chronic kidney disease),  stage III (HCC)    COVID-19 virus infection 07/18/2020   Last Assessment & Plan:   Formatting of this note might be different from the original.  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 10/2019 Echo: EF 55-60%, Gr2 DD. Nl RV.    Neuromuscular disorder (HCC)    Neuropathy associated with endocrine disorder (HCC)    Nonobstructive CAD (coronary artery disease)    a. 2012 Cath: mod, nonobs dzs; b. 10/2016 MV: EF 60%, no ischemia.   OSA on CPAP 05/05/2017   Unattended Home Sleep Test 7/2/813-AHI 38.6/hour, desaturation to 64%, body weight 261 pounds   PONV (postoperative nausea and vomiting)    Postoperative anemia due to acute blood loss 11/07/2020   Postoperative hemorrhagic shock 03/20/2021   Sleep apnea     uses cpap asked to bring mask and tubing    Family History  Problem Relation Age of Onset    Diabetes Mellitus II Mother    Emphysema Father 32   Heart attack Father    Colon cancer Neg Hx    Esophageal cancer Neg Hx    Rectal cancer Neg Hx    Stomach cancer Neg Hx     Past Surgical History:  Procedure Laterality Date   ABDOMINAL AORTOGRAM W/LOWER EXTREMITY N/A 12/10/2020   Procedure: ABDOMINAL AORTOGRAM W/LOWER EXTREMITY;  Surgeon: Leonie Douglas, MD;  [facility];  Service: Cardiovascular;  Laterality: N/A;   AMPUTATION Right 01/22/2021   Procedure: 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;  Surgeon: [name], MD;  Location: [facility] OR;  Service: Orthopedics;  Laterality: Right;   AMPUTATION Right 03/17/2021   Procedure: RIGHT BELOW KNEE AMPUTATION;  Surgeon: [name], MD;  Location: [facility] OR;  Service: Orthopedics;  Laterality: Right;   ANKLE FUSION Right 01/22/2021   Procedure: RIGHT FOOT TIBIOCALCANEAL FUSION;  Surgeon: [name], MD;  Location: [facility] OR;  Service: Orthopedics;  Laterality: Right;   ANTERIOR FUSION CERVICAL SPINE  2012   CARDIAC CATHETERIZATION  2011   CARDIAC CATHETERIZATION N/A 11/09/2016   Procedure: Right Heart Cath;  Surgeon: Lyn Records, MD;  Location: [facility] INVASIVE CV LAB;  Service: Cardiovascular;  Laterality: N/A;   carpel tunnel     left wrist   CATARACT EXTRACTION     CATARACT EXTRACTION W/ INTRAOCULAR LENS  IMPLANT, BILATERAL  2013   CERVICAL LAMINECTOMY  2012   COLONOSCOPY N/A 01/14/2013   Procedure: COLONOSCOPY;  Surgeon: Hilarie Fredrickson, MD;  Location: [facility];  Service: Endoscopy;  Laterality: N/A;   CORONARY ARTERY BYPASS GRAFT  11/04/2020   LIMA-LAD, SVG-OM1, SVG-PDA (Dr Rochele Pages [facility]) dc 11/18/2020   EYE SURGERY     I & D EXTREMITY Right 02/19/2021   Procedure: RIGHT ANKLE DEBRIDEMENT AND PLACEMENT ANTIBIOTIC BEADS;  Surgeon: [name], MD;  Location: [facility] OR;  Service: Orthopedics;  Laterality: Right;   KNEE SURGERY  1998   left   LEFT HEART CATH  AND CORONARY ANGIOGRAPHY N/A 07/10/2020   Procedure: LEFT HEART CATH AND  CORONARY ANGIOGRAPHY;  Surgeon: Tonny Bollman, MD;  Location: [facility] INVASIVE CV LAB;  Service: Cardiovascular;  Laterality: N/A;   LUMBAR LAMINECTOMY  2003   LUNG BIOPSY Left 12/26/2016   Procedure: LUNG BIOPSY;  Surgeon: Loreli Slot, MD;  Location: [facility] OR;  Service: Thoracic;  Laterality: Left;   PACEMAKER IMPLANT N/A 03/30/2020   Procedure: PACEMAKER IMPLANT;  Surgeon: Marinus Maw, MD;  [facility]: [facility];  Service: Cardiovascular;  Laterality: N/A;   PERIPHERAL VASCULAR INTERVENTION Right 12/10/2020   Procedure: PERIPHERAL VASCULAR INTERVENTION;  Surgeon: Leonie Douglas, MD;  [facility];  Service: Cardiovascular;  Laterality: Right;  SFA   POSTERIOR FUSION CERVICAL SPINE  2012   PPM GENERATOR REMOVAL N/A 12/14/2020   Procedure: PPM GENERATOR REMOVAL;  Surgeon: Marinus Maw, MD;  [facility];  Service: Cardiovascular;  Laterality: N/A;   TEE WITHOUT CARDIOVERSION N/A 12/11/2020   Procedure: TRANSESOPHAGEAL ECHOCARDIOGRAM (TEE);  Surgeon: Sande Rives, MD;  Location: [facility] ENDOSCOPY;  Service: Cardiovascular;  Laterality: N/A;   TRIGGER FINGER RELEASE  2011   4th finger left hand   VIDEO ASSISTED THORACOSCOPY Left 12/26/2016   Procedure: VIDEO ASSISTED THORACOSCOPY;  Surgeon: Loreli Slot, MD;  Location: [facility] OR;  Service: Thoracic;  Laterality: Left;   VIDEO BRONCHOSCOPY N/A 12/26/2016   Procedure: VIDEO BRONCHOSCOPY;  Surgeon: Loreli Slot, MD;  Location: [facility] OR;  Service: Thoracic;  Laterality: N/A;   Social History   Occupational History   Not on file  Tobacco Use   Smoking status: Never   Smokeless tobacco: Never  Vaping Use   Vaping status: Never Used  Substance and Sexual Activity   Alcohol use: No   Drug use: No   Sexual activity: Not Currently

## 2024-03-07 ENCOUNTER — Ambulatory Visit: Admitting: Orthopedic Surgery

## 2024-03-14 ENCOUNTER — Ambulatory Visit: Admitting: Orthopedic Surgery

## 2024-03-14 DIAGNOSIS — Z89432 Acquired absence of left foot: Secondary | ICD-10-CM

## 2024-03-14 DIAGNOSIS — L97521 Non-pressure chronic ulcer of other part of left foot limited to breakdown of skin: Secondary | ICD-10-CM | POA: Diagnosis not present

## 2024-03-14 DIAGNOSIS — Z89439 Acquired absence of unspecified foot: Secondary | ICD-10-CM

## 2024-03-21 ENCOUNTER — Ambulatory Visit: Admitting: Orthopedic Surgery

## 2024-03-21 DIAGNOSIS — Z89439 Acquired absence of unspecified foot: Secondary | ICD-10-CM | POA: Diagnosis not present

## 2024-03-22 ENCOUNTER — Encounter: Payer: Self-pay | Admitting: Orthopedic Surgery

## 2024-03-22 NOTE — Progress Notes (Signed)
 Office Visit Note   Patient: Shane [name]           Date of Birth: 1941-10-15           MRN: [id number] Visit Date: 03/21/2024              Requested by: Suan Elm, MD [address] [place],  [place] [address] PCP: Suan Elm, MD  Chief Complaint  Patient presents with   Left Foot - Follow-up    Hx TMA       HPI: Patient is an 83 year old gentleman who is seen in follow-up status post left transmetatarsal amputation.  Patient has been using a nitroglycerin  patch and a Vive sock.  Using Vashe for dressing changes.  Patient is 3 years status post right below-knee amputation and status post left transmetatarsal amputation 2-1/2 years ago.  Assessment & Plan: Visit Diagnoses:  1. History of transmetatarsal amputation of foot (HCC)     Plan: Continue with Vashe and the compression sock.  Follow-Up Instructions: Return in about 4 weeks (around 04/18/2024).   Ortho Exam  Patient is alert, oriented, no adenopathy, well-dressed, normal affect, normal respiratory effort. Examination the abrasion on the dorsal ankle is healed with superficial epithelialization.  The ulcer over the lateral transmetatarsal amputation has superficial epithelialization there is no drainage no cellulitis.  Imaging: No results found. No images are attached to the encounter.  Labs: Lab Results  Component Value Date   HGBA1C 8.5 (H) 12/06/2023   HGBA1C 9.0 (H) 06/24/2023   HGBA1C 7.8 (H) 02/15/2021   ESRSEDRATE 84 (H) 12/29/2023   ESRSEDRATE 22 (H) 10/14/2016   CRP 3.5 (H) 12/29/2023   CRP 9.0 (H) 12/08/2023   CRP 8.3 (H) 12/09/2020   REPTSTATUS 12/11/2023 FINAL 12/06/2023   GRAMSTAIN  02/19/2021    RARE WBC PRESENT, PREDOMINANTLY PMN NO ORGANISMS SEEN    CULT  12/06/2023    NO GROWTH 5 DAYS Performed at [facility], [address] [place], [place] [address]    [facility] ESCHERICHIA COLI 02/19/2021     Lab Results  Component Value Date   ALBUMIN  2.3 (L)  12/27/2023   ALBUMIN  2.5 (L) 12/20/2023   ALBUMIN  2.5 (L) 12/18/2023    Lab Results  Component Value Date   MG 1.8 12/30/2023   MG 1.9 12/29/2023   MG 1.6 (L) 12/28/2023   Lab Results  Component Value Date   VD25OH 32.29 12/14/2022    No results found for: "PREALBUMIN"    Latest Ref Rng & Units 12/29/2023    2:29 PM 12/27/2023    6:14 AM 12/26/2023    5:57 AM  CBC EXTENDED  WBC 4.0 - 10.5 K/uL 6.5  6.0  6.4   RBC 4.22 - 5.81 MIL/uL 3.84  3.36  3.43   Hemoglobin 13.0 - 17.0 g/dL 96.2  9.8  9.9   HCT 95.2 - 52.0 % 34.7  30.4  31.1   Platelets 150 - 400 K/uL 229  177  164   NEUT# 1.7 - 7.7 K/uL 4.7     Lymph# 0.7 - 4.0 K/uL 0.8        There is no height or weight on file to calculate BMI.  Orders:  No orders of the defined types were placed in this encounter.  No orders of the defined types were placed in this encounter.    Procedures: No procedures performed  Clinical Data: No additional findings.  ROS:  All other systems negative, except as noted  in the HPI. Review of Systems  Objective: Vital Signs: There were no vitals taken for this visit.  Specialty Comments:  No specialty comments available.  PMFS History: Patient Active Problem List   Diagnosis Date Noted   Non-pressure chronic ulcer of other part of left foot limited to breakdown of skin (HCC) 12/27/2023   Type 2 diabetes mellitus without complication, with long-term current use of insulin  (HCC) 12/12/2023   Esophageal dysphagia 12/11/2023   COVID-19 12/11/2023   Acute on chronic hypoxic respiratory failure (HCC) 12/10/2023   Anemia 12/10/2023   Non-ST elevation (NSTEMI) myocardial infarction (HCC) 12/09/2023   Retroperitoneal bleeding 12/09/2023   History of transcatheter aortic valve replacement (TAVR) 12/09/2023   Chronic heart failure with preserved ejection fraction (HFpEF) (HCC) 12/09/2023   Hx of CABG 12/09/2023   Atherosclerosis of native coronary artery of native heart without angina  pectoris 12/09/2023   Hyperkalemia 12/07/2023   Acute on chronic congestive heart failure (HCC) 12/07/2023   Acute kidney injury (nontraumatic) (HCC) 12/06/2023   Acute on chronic combined systolic and diastolic CHF, NYHA class 1 (HCC) 06/22/2023   Acute on chronic systolic (congestive) heart failure (HCC) 06/21/2023   Intermediate stage nonexudative age-related macular degeneration of both eyes 04/25/2022   Research study patient 12/24/2021   Labile blood glucose    Pulmonary fibrosis (HCC)    Hyponatremia    S/P BKA (below knee amputation) unilateral, right (HCC)    Shortness of breath    Atrial fibrillation (HCC)    Postoperative pain    Atherosclerosis of native arteries of extremities with gangrene, right leg (HCC)    Hardware complicating wound infection (HCC)    Abscess of bursa of right ankle 02/15/2021   Endocarditis 01/19/2021   PAD (peripheral artery disease) (HCC) 12/29/2020   Chronic systolic CHF (congestive heart failure) (HCC) 12/22/2020   History of COVID-19 12/22/2020   SSS (sick sinus syndrome) (HCC) 12/22/2020   Acute bacterial endocarditis    MSSA bacteremia 12/09/2020   Diabetic foot ulcer (HCC) 12/09/2020   Foot drop, right foot    Generalized weakness 12/08/2020   Atrial fibrillation, chronic (HCC) 12/08/2020   Elevated troponin level not due myocardial infarction 12/08/2020   Adrenal insufficiency (HCC) 11/14/2020   Orthostatic hypotension 11/14/2020   Physical deconditioning 11/14/2020   Difficulty sleeping 11/07/2020   Right ventricular dysfunction 11/05/2020   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/04/2020   Hearing loss 11/03/2020   Cardiac device in situ 09/09/2020   COVID-19 virus infection 07/18/2020   Coronary artery disease involving native heart without angina pectoris 07/10/2020   Chronic kidney disease, stage 3a (HCC) 03/29/2020   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/28/2020   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)  03/16/2020   Right epiretinal membrane 03/16/2020   Posterior vitreous detachment of right eye 03/16/2020   Aortic stenosis, moderate 03/12/2020   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   Near syncope 04/27/2018   Hypoglycemia due to insulin  01/06/2018   Essential hypertension 01/06/2018   Syncope and collapse 01/05/2018   Encounter for therapeutic drug monitoring 06/29/2017   OSA on CPAP 05/05/2017   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HCC) 01/05/2017   Abnormal chest x-ray 10/12/2016   Benign neoplasm of colon 01/14/2013   Past Medical History:  Diagnosis Date   Acute blood loss anemia    Anxiety    AV block, Mobitz 1    Cataract    Chronic kidney disease    d/t  DM   CKD (chronic kidney disease), stage III (HCC)    COVID-19 virus infection 07/18/2020   Last Assessment & Plan:   Formatting of this note might be different from the original.  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 10/2019 Echo: EF 55-60%, Gr2 DD. Nl RV.    Neuromuscular disorder (HCC)    Neuropathy associated with endocrine disorder (HCC)    Nonobstructive CAD (coronary artery disease)    a. 2012 Cath: mod, nonobs dzs; b. 10/2016 MV: EF 60%, no ischemia.   OSA on CPAP 05/05/2017   Unattended Home Sleep Test 7/2/813-AHI 38.6/hour, desaturation to 64%, body weight 261 pounds   PONV (postoperative nausea and vomiting)    Postoperative anemia due to acute blood loss 11/07/2020   Postoperative hemorrhagic shock 03/20/2021   Sleep apnea      uses cpap asked to bring mask and tubing    Family History  Problem Relation Age of Onset   Diabetes Mellitus II Mother    Emphysema Father 70   Heart attack Father    Colon cancer Neg Hx    Esophageal cancer Neg Hx    Rectal cancer Neg Hx    Stomach cancer Neg Hx     Past Surgical History:  Procedure Laterality Date   ABDOMINAL AORTOGRAM W/LOWER EXTREMITY N/A 12/10/2020   Procedure: ABDOMINAL AORTOGRAM W/LOWER EXTREMITY;  Surgeon: Carlene Che, MD;  [facility] LAB;  Service: Cardiovascular;  Laterality: N/A;   AMPUTATION Right 01/22/2021   Procedure: 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;  Surgeon: Timothy Ford, MD;  Location: [facility] OR;  Service: Orthopedics;  Laterality: Right;   AMPUTATION Right 03/17/2021   Procedure: RIGHT BELOW KNEE AMPUTATION;  Surgeon: Timothy Ford, MD;  Location: [facility] OR;  Service: Orthopedics;  Laterality: Right;   ANKLE FUSION Right 01/22/2021   Procedure: RIGHT FOOT TIBIOCALCANEAL FUSION;  Surgeon: Timothy Ford, MD;  Location: [facility] OR;  Service: Orthopedics;  Laterality: Right;   ANTERIOR FUSION CERVICAL SPINE  2012   CARDIAC CATHETERIZATION  2011   CARDIAC CATHETERIZATION N/A 11/09/2016   Procedure: Right Heart Cath;  Surgeon: Arty Binning, MD;  Location: [facility] INVASIVE CV LAB;  Service: Cardiovascular;  Laterality: N/A;   carpel tunnel     left wrist   CATARACT EXTRACTION     CATARACT EXTRACTION W/ INTRAOCULAR LENS  IMPLANT, BILATERAL  2013   CERVICAL LAMINECTOMY  2012   COLONOSCOPY N/A 01/14/2013   Procedure: COLONOSCOPY;  Surgeon: Tobin Forts, MD;  Location: [facility] ENDOSCOPY;  Service: Endoscopy;  Laterality: N/A;   CORONARY ARTERY BYPASS GRAFT  11/04/2020   LIMA-LAD, SVG-OM1, SVG-PDA (Dr Silver Dross [facility]) dc 11/18/2020   EYE SURGERY     I & D EXTREMITY Right 02/19/2021   Procedure: RIGHT ANKLE DEBRIDEMENT AND PLACEMENT ANTIBIOTIC BEADS;  Surgeon: Timothy Ford, MD;  Location: [facility] OR;  Service: Orthopedics;  Laterality: Right;   KNEE SURGERY  1998  left    LEFT HEART CATH AND CORONARY ANGIOGRAPHY N/A 07/10/2020   Procedure: LEFT HEART CATH AND CORONARY ANGIOGRAPHY;  Surgeon: Arnoldo Lapping, MD;  Location: [facility] INVASIVE CV LAB;  Service: Cardiovascular;  Laterality: N/A;   LUMBAR LAMINECTOMY  2003   LUNG BIOPSY Left 12/26/2016   Procedure: LUNG BIOPSY;  Surgeon: Zelphia Higashi, MD;  Location: [facility] OR;  Service: Thoracic;  Laterality: Left;   PACEMAKER IMPLANT N/A 03/30/2020   Procedure: PACEMAKER IMPLANT;  Surgeon: Tammie Fall, MD;  [facility] LAB;  Service: Cardiovascular;  Laterality: N/A;   PERIPHERAL VASCULAR INTERVENTION Right 12/10/2020   Procedure: PERIPHERAL VASCULAR INTERVENTION;  Surgeon: Carlene Che, MD;  [facility];  Service: Cardiovascular;  Laterality: Right;  SFA   POSTERIOR FUSION CERVICAL SPINE  2012   PPM GENERATOR REMOVAL N/A 12/14/2020   Procedure: PPM GENERATOR REMOVAL;  Surgeon: Tammie Fall, MD;  [facility] LAB;  Service: Cardiovascular;  Laterality: N/A;   TEE WITHOUT CARDIOVERSION N/A 12/11/2020   Procedure: TRANSESOPHAGEAL ECHOCARDIOGRAM (TEE);  Surgeon: Harrold Lincoln, MD;  Location: [facility] ENDOSCOPY;  Service: Cardiovascular;  Laterality: N/A;   TRIGGER FINGER RELEASE  2011   4th finger left hand   VIDEO ASSISTED THORACOSCOPY Left 12/26/2016   Procedure: VIDEO ASSISTED THORACOSCOPY;  Surgeon: Zelphia Higashi, MD;  Location: [facility] OR;  Service: Thoracic;  Laterality: Left;   VIDEO BRONCHOSCOPY N/A 12/26/2016   Procedure: VIDEO BRONCHOSCOPY;  Surgeon: Zelphia Higashi, MD;  Location: [facility] OR;  Service: Thoracic;  Laterality: N/A;   Social History   Occupational History   Not on file  Tobacco Use   Smoking status: Never   Smokeless tobacco: Never  Vaping Use   Vaping status: Never Used  Substance and Sexual Activity   Alcohol use: No   Drug use: No   Sexual activity: Not Currently

## 2024-04-02 ENCOUNTER — Telehealth: Payer: Self-pay | Admitting: Orthopedic Surgery

## 2024-04-02 NOTE — Telephone Encounter (Signed)
 Shane [name] from [facility] called and letting you know that either of his wounds isn't looking good or healing. CB#[phone]

## 2024-04-02 NOTE — Telephone Encounter (Signed)
 Were they wanting the pt to come in for eval this week? I can make an appt for him to see [facility] tomorrow. Just checking to see if this is what [facility] communicated. He has an appt on 04/18/2024 but we can move up if that is what she wanted.

## 2024-04-07 ENCOUNTER — Encounter: Payer: Self-pay | Admitting: Orthopedic Surgery

## 2024-04-07 NOTE — Progress Notes (Signed)
 Office Visit Note   Patient: Shane [name]           Date of Birth: Apr 13, 1941           MRN: [id number] Visit Date: 03/14/2024              Requested by: Suan Elm, MD [address] [place],  [place] [address] PCP: Suan Elm, MD  Chief Complaint  Patient presents with   Left Foot - Wound Check      HPI: Patient is an 83 year old gentleman who presents with a left transmetatarsal amputation with ulceration.  Patient has been using a nitroglycerin  patch has been using Vashe and compression.  Assessment & Plan: Visit Diagnoses:  1. History of transmetatarsal amputation of foot (HCC)   2. Non-pressure chronic ulcer of other part of left foot limited to breakdown of skin (HCC)     Plan: Ulcer was debrided back to healthy viable tissue.  Patient will need compression and continue with Vashe dressing changes.  Follow-Up Instructions: Return in about 1 week (around 03/21/2024).   Ortho Exam  Patient is alert, oriented, no adenopathy, well-dressed, normal affect, normal respiratory effort. Examination patient has blistering over the dorsum of his foot from swelling he has acute increased swelling with weeping edema.  His calf is 49 cm in circumference.  He does have an ulcer over the lateral aspect of the left forefoot.  After informed consent a 10 blade knife was used to debride the ulcer back to healthy viable granulation tissue there is no tunneling no exposed bone or tendon no drainage.  After debridement the ulcer is 1 x 2 cm.  Patient's A1c is 6.9 with a glucose of 119.  Imaging: No results found.    Labs: Lab Results  Component Value Date   HGBA1C 8.5 (H) 12/06/2023   HGBA1C 9.0 (H) 06/24/2023   HGBA1C 7.8 (H) 02/15/2021   ESRSEDRATE 84 (H) 12/29/2023   ESRSEDRATE 22 (H) 10/14/2016   CRP 3.5 (H) 12/29/2023   CRP 9.0 (H) 12/08/2023   CRP 8.3 (H) 12/09/2020   REPTSTATUS 12/11/2023 FINAL 12/06/2023   GRAMSTAIN  02/19/2021    RARE WBC PRESENT,  PREDOMINANTLY PMN NO ORGANISMS SEEN    CULT  12/06/2023    NO GROWTH 5 DAYS Performed at [facility], [address] [place], [place] [address]    [facility] ESCHERICHIA COLI 02/19/2021     Lab Results  Component Value Date   ALBUMIN  2.3 (L) 12/27/2023   ALBUMIN  2.5 (L) 12/20/2023   ALBUMIN  2.5 (L) 12/18/2023    Lab Results  Component Value Date   MG 1.8 12/30/2023   MG 1.9 12/29/2023   MG 1.6 (L) 12/28/2023   Lab Results  Component Value Date   VD25OH 32.29 12/14/2022    No results found for: "PREALBUMIN"    Latest Ref Rng & Units 12/29/2023    2:29 PM 12/27/2023    6:14 AM 12/26/2023    5:57 AM  CBC EXTENDED  WBC 4.0 - 10.5 K/uL 6.5  6.0  6.4   RBC 4.22 - 5.81 MIL/uL 3.84  3.36  3.43   Hemoglobin 13.0 - 17.0 g/dL 32.4  9.8  9.9   HCT 40.1 - 52.0 % 34.7  30.4  31.1   Platelets 150 - 400 K/uL 229  177  164   NEUT# 1.7 - 7.7 K/uL 4.7     Lymph# 0.7 - 4.0 K/uL 0.8  There is no height or weight on file to calculate BMI.  Orders:  No orders of the defined types were placed in this encounter.  No orders of the defined types were placed in this encounter.    Procedures: No procedures performed  Clinical Data: No additional findings.  ROS:  All other systems negative, except as noted in the HPI. Review of Systems  Objective: Vital Signs: There were no vitals taken for this visit.  Specialty Comments:  No specialty comments available.  PMFS History: Patient Active Problem List   Diagnosis Date Noted   Non-pressure chronic ulcer of other part of left foot limited to breakdown of skin (HCC) 12/27/2023   Type 2 diabetes mellitus without complication, with long-term current use of insulin  (HCC) 12/12/2023   Esophageal dysphagia 12/11/2023   COVID-19 12/11/2023   Acute on chronic hypoxic respiratory failure (HCC) 12/10/2023   Anemia 12/10/2023   Non-ST elevation (NSTEMI) myocardial infarction (HCC) 12/09/2023   Retroperitoneal bleeding  12/09/2023   History of transcatheter aortic valve replacement (TAVR) 12/09/2023   Chronic heart failure with preserved ejection fraction (HFpEF) (HCC) 12/09/2023   Hx of CABG 12/09/2023   Atherosclerosis of native coronary artery of native heart without angina pectoris 12/09/2023   Hyperkalemia 12/07/2023   Acute on chronic congestive heart failure (HCC) 12/07/2023   Acute kidney injury (nontraumatic) (HCC) 12/06/2023   Acute on chronic combined systolic and diastolic CHF, NYHA class 1 (HCC) 06/22/2023   Acute on chronic systolic (congestive) heart failure (HCC) 06/21/2023   Intermediate stage nonexudative age-related macular degeneration of both eyes 04/25/2022   Research study patient 12/24/2021   Labile blood glucose    Pulmonary fibrosis (HCC)    Hyponatremia    S/P BKA (below knee amputation) unilateral, right (HCC)    Shortness of breath    Atrial fibrillation (HCC)    Postoperative pain    Atherosclerosis of native arteries of extremities with gangrene, right leg (HCC)    Hardware complicating wound infection (HCC)    Abscess of bursa of right ankle 02/15/2021   Endocarditis 01/19/2021   PAD (peripheral artery disease) (HCC) 12/29/2020   Chronic systolic CHF (congestive heart failure) (HCC) 12/22/2020   History of COVID-19 12/22/2020   SSS (sick sinus syndrome) (HCC) 12/22/2020   Acute bacterial endocarditis    MSSA bacteremia 12/09/2020   Diabetic foot ulcer (HCC) 12/09/2020   Foot drop, right foot    Generalized weakness 12/08/2020   Atrial fibrillation, chronic (HCC) 12/08/2020   Elevated troponin level not due myocardial infarction 12/08/2020   Adrenal insufficiency (HCC) 11/14/2020   Orthostatic hypotension 11/14/2020   Physical deconditioning 11/14/2020   Difficulty sleeping 11/07/2020   Right ventricular dysfunction 11/05/2020   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/04/2020   Hearing loss 11/03/2020   Cardiac device in situ 09/09/2020   COVID-19 virus infection 07/18/2020    Coronary artery disease involving native heart without angina pectoris 07/10/2020   Chronic kidney disease, stage 3a (HCC) 03/29/2020   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/28/2020   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) 03/16/2020   Right epiretinal membrane 03/16/2020   Posterior vitreous detachment of right eye 03/16/2020   Aortic stenosis, moderate 03/12/2020   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   Near syncope 04/27/2018   Hypoglycemia due to insulin  01/06/2018   Essential hypertension 01/06/2018   Syncope and collapse 01/05/2018   Encounter for therapeutic drug monitoring 06/29/2017   OSA on CPAP 05/05/2017  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HCC) 01/05/2017   Abnormal chest x-ray 10/12/2016   Benign neoplasm of colon 01/14/2013   Past Medical History:  Diagnosis Date   Acute blood loss anemia    Anxiety    AV block, Mobitz 1    Cataract    Chronic kidney disease    d/t DM   CKD (chronic kidney disease), stage III (HCC)    COVID-19 virus infection 07/18/2020   Last Assessment & Plan:   Formatting of this note might be different from the original.  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 10/2019 Echo: EF 55-60%, Gr2 DD. Nl RV.    Neuromuscular disorder (HCC)    Neuropathy associated with endocrine disorder (HCC)    Nonobstructive CAD (coronary artery disease)    a. 2012 Cath: mod, nonobs dzs; b. 10/2016 MV: EF 60%, no ischemia.   OSA on CPAP 05/05/2017    Unattended Home Sleep Test 7/2/813-AHI 38.6/hour, desaturation to 64%, body weight 261 pounds   PONV (postoperative nausea and vomiting)    Postoperative anemia due to acute blood loss 11/07/2020   Postoperative hemorrhagic shock 03/20/2021   Sleep apnea     uses cpap asked to bring mask and tubing    Family History  Problem Relation Age of Onset   Diabetes Mellitus II Mother    Emphysema Father 21   Heart attack Father    Colon cancer Neg Hx    Esophageal cancer Neg Hx    Rectal cancer Neg Hx    Stomach cancer Neg Hx     Past Surgical History:  Procedure Laterality Date   ABDOMINAL AORTOGRAM W/LOWER EXTREMITY N/A 12/10/2020   Procedure: ABDOMINAL AORTOGRAM W/LOWER EXTREMITY;  Surgeon: Carlene Che, MD;  Location: [facility] INVASIVE CV LAB;  Service: Cardiovascular;  Laterality: N/A;   AMPUTATION Right 01/22/2021   Procedure: 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;  Surgeon: Timothy Ford, MD;  Location: [facility] OR;  Service: Orthopedics;  Laterality: Right;   AMPUTATION Right 03/17/2021   Procedure: RIGHT BELOW KNEE AMPUTATION;  Surgeon: Timothy Ford, MD;  Location: [facility] OR;  Service: Orthopedics;  Laterality: Right;   ANKLE FUSION Right 01/22/2021   Procedure: RIGHT FOOT TIBIOCALCANEAL FUSION;  Surgeon: Timothy Ford, MD;  Location: [facility] OR;  Service: Orthopedics;  Laterality: Right;   ANTERIOR FUSION CERVICAL SPINE  2012   CARDIAC CATHETERIZATION  2011   CARDIAC CATHETERIZATION N/A 11/09/2016   Procedure: Right Heart Cath;  Surgeon: Arty Binning, MD;  Location: [facility] INVASIVE CV LAB;  Service: Cardiovascular;  Laterality: N/A;   carpel tunnel     left wrist   CATARACT EXTRACTION     CATARACT EXTRACTION W/ INTRAOCULAR LENS  IMPLANT, BILATERAL  2013   CERVICAL LAMINECTOMY  2012   COLONOSCOPY N/A 01/14/2013   Procedure: COLONOSCOPY;  Surgeon: Tobin Forts, MD;  Location: [facility] ENDOSCOPY;  Service: Endoscopy;  Laterality: N/A;   CORONARY ARTERY BYPASS GRAFT  11/04/2020  LIMA-LAD, SVG-OM1, SVG-PDA (Dr Silver [facility]) dc 11/18/2020   EYE SURGERY     I & D EXTREMITY Right 02/19/2021   Procedure: RIGHT ANKLE DEBRIDEMENT AND PLACEMENT ANTIBIOTIC BEADS;  Surgeon: Timothy Ford, MD;  Location: [facility] OR;  Service: Orthopedics;  Laterality: Right;   KNEE SURGERY  1998   left   LEFT HEART CATH AND CORONARY ANGIOGRAPHY N/A 07/10/2020   Procedure: LEFT HEART CATH AND CORONARY ANGIOGRAPHY;  Surgeon: Arnoldo Lapping, MD;  Location: [facility] INVASIVE CV LAB;  Service: Cardiovascular;  Laterality: N/A;   LUMBAR LAMINECTOMY  2003   LUNG BIOPSY Left 12/26/2016   Procedure: LUNG BIOPSY;  Surgeon: Zelphia Higashi, MD;  Location: [facility] OR;  Service: Thoracic;  Laterality: Left;   PACEMAKER IMPLANT N/A 03/30/2020   Procedure: PACEMAKER IMPLANT;  Surgeon: Tammie Fall, MD;  Location: [facility];  Service: Cardiovascular;  Laterality: N/A;   PERIPHERAL VASCULAR INTERVENTION Right 12/10/2020   Procedure: PERIPHERAL VASCULAR INTERVENTION;  Surgeon: Carlene Che, MD;  Location: [facility];  Service: Cardiovascular;  Laterality: Right;  SFA   POSTERIOR FUSION CERVICAL SPINE  2012   PPM GENERATOR REMOVAL N/A 12/14/2020   Procedure: PPM GENERATOR REMOVAL;  Surgeon: Tammie Fall, MD;  Location: [facility];  Service: Cardiovascular;  Laterality: N/A;   TEE WITHOUT CARDIOVERSION N/A 12/11/2020   Procedure: TRANSESOPHAGEAL ECHOCARDIOGRAM (TEE);  Surgeon: Harrold Lincoln, MD;  Location: [facility] ENDOSCOPY;  Service: Cardiovascular;  Laterality: N/A;   TRIGGER FINGER RELEASE  2011   4th finger left hand   VIDEO ASSISTED THORACOSCOPY Left 12/26/2016   Procedure: VIDEO ASSISTED THORACOSCOPY;  Surgeon: Zelphia Higashi, MD;  Location: [facility] OR;  Service: Thoracic;  Laterality: Left;   VIDEO BRONCHOSCOPY N/A 12/26/2016   Procedure: VIDEO BRONCHOSCOPY;  Surgeon: Zelphia Higashi, MD;  Location: [facility] OR;  Service: Thoracic;  Laterality: N/A;   Social History   Occupational History   Not on file  Tobacco Use    Smoking status: Never   Smokeless tobacco: Never  Vaping Use   Vaping status: Never Used  Substance and Sexual Activity   Alcohol use: No   Drug use: No   Sexual activity: Not Currently

## 2024-04-09 ENCOUNTER — Inpatient Hospital Stay (HOSPITAL_COMMUNITY)

## 2024-04-09 ENCOUNTER — Inpatient Hospital Stay (HOSPITAL_COMMUNITY)
Admission: EM | Admit: 2024-04-09 | Discharge: 2024-04-14 | DRG: 175 | Disposition: A | Discharge status: Home Health | Attending: Internal Medicine | Admitting: Internal Medicine

## 2024-04-09 ENCOUNTER — Encounter (HOSPITAL_COMMUNITY): Payer: Self-pay

## 2024-04-09 ENCOUNTER — Emergency Department (HOSPITAL_COMMUNITY)

## 2024-04-09 ENCOUNTER — Other Ambulatory Visit: Payer: Self-pay

## 2024-04-09 DIAGNOSIS — I130 Hypertensive heart and chronic kidney disease with heart failure and stage 1 through stage 4 chronic kidney disease, or unspecified chronic kidney disease: Secondary | ICD-10-CM | POA: Diagnosis present

## 2024-04-09 DIAGNOSIS — I495 Sick sinus syndrome: Secondary | ICD-10-CM | POA: Diagnosis present

## 2024-04-09 DIAGNOSIS — Z888 Allergy status to other drugs, medicaments and biological substances status: Secondary | ICD-10-CM

## 2024-04-09 DIAGNOSIS — I214 Non-ST elevation (NSTEMI) myocardial infarction: Secondary | ICD-10-CM | POA: Diagnosis present

## 2024-04-09 DIAGNOSIS — Z833 Family history of diabetes mellitus: Secondary | ICD-10-CM

## 2024-04-09 DIAGNOSIS — Z66 Do not resuscitate: Secondary | ICD-10-CM | POA: Diagnosis present

## 2024-04-09 DIAGNOSIS — I951 Orthostatic hypotension: Secondary | ICD-10-CM | POA: Diagnosis present

## 2024-04-09 DIAGNOSIS — Z8616 Personal history of COVID-19: Secondary | ICD-10-CM | POA: Diagnosis not present

## 2024-04-09 DIAGNOSIS — Z79899 Other long term (current) drug therapy: Secondary | ICD-10-CM

## 2024-04-09 DIAGNOSIS — E2740 Unspecified adrenocortical insufficiency: Secondary | ICD-10-CM | POA: Diagnosis present

## 2024-04-09 DIAGNOSIS — N179 Acute kidney failure, unspecified: Principal | ICD-10-CM | POA: Diagnosis present

## 2024-04-09 DIAGNOSIS — R7989 Other specified abnormal findings of blood chemistry: Secondary | ICD-10-CM | POA: Diagnosis present

## 2024-04-09 DIAGNOSIS — Z950 Presence of cardiac pacemaker: Secondary | ICD-10-CM

## 2024-04-09 DIAGNOSIS — D649 Anemia, unspecified: Secondary | ICD-10-CM | POA: Diagnosis present

## 2024-04-09 DIAGNOSIS — E1151 Type 2 diabetes mellitus with diabetic peripheral angiopathy without gangrene: Secondary | ICD-10-CM | POA: Diagnosis present

## 2024-04-09 DIAGNOSIS — Z89511 Acquired absence of right leg below knee: Secondary | ICD-10-CM

## 2024-04-09 DIAGNOSIS — E860 Dehydration: Secondary | ICD-10-CM | POA: Diagnosis present

## 2024-04-09 DIAGNOSIS — I998 Other disorder of circulatory system: Secondary | ICD-10-CM | POA: Diagnosis present

## 2024-04-09 DIAGNOSIS — I4891 Unspecified atrial fibrillation: Secondary | ICD-10-CM | POA: Diagnosis present

## 2024-04-09 DIAGNOSIS — I739 Peripheral vascular disease, unspecified: Secondary | ICD-10-CM | POA: Diagnosis present

## 2024-04-09 DIAGNOSIS — N1831 Chronic kidney disease, stage 3a: Secondary | ICD-10-CM | POA: Diagnosis present

## 2024-04-09 DIAGNOSIS — Z885 Allergy status to narcotic agent status: Secondary | ICD-10-CM

## 2024-04-09 DIAGNOSIS — R55 Syncope and collapse: Secondary | ICD-10-CM | POA: Diagnosis not present

## 2024-04-09 DIAGNOSIS — J069 Acute upper respiratory infection, unspecified: Secondary | ICD-10-CM | POA: Diagnosis present

## 2024-04-09 DIAGNOSIS — Z952 Presence of prosthetic heart valve: Secondary | ICD-10-CM

## 2024-04-09 DIAGNOSIS — F32A Depression, unspecified: Secondary | ICD-10-CM | POA: Diagnosis present

## 2024-04-09 DIAGNOSIS — E785 Hyperlipidemia, unspecified: Secondary | ICD-10-CM | POA: Diagnosis present

## 2024-04-09 DIAGNOSIS — I38 Endocarditis, valve unspecified: Secondary | ICD-10-CM | POA: Diagnosis present

## 2024-04-09 DIAGNOSIS — J84112 Idiopathic pulmonary fibrosis: Secondary | ICD-10-CM | POA: Diagnosis present

## 2024-04-09 DIAGNOSIS — E1122 Type 2 diabetes mellitus with diabetic chronic kidney disease: Secondary | ICD-10-CM | POA: Diagnosis present

## 2024-04-09 DIAGNOSIS — I5023 Acute on chronic systolic (congestive) heart failure: Secondary | ICD-10-CM | POA: Diagnosis not present

## 2024-04-09 DIAGNOSIS — B9781 Human metapneumovirus as the cause of diseases classified elsewhere: Secondary | ICD-10-CM | POA: Diagnosis present

## 2024-04-09 DIAGNOSIS — Z951 Presence of aortocoronary bypass graft: Secondary | ICD-10-CM

## 2024-04-09 DIAGNOSIS — Z89432 Acquired absence of left foot: Secondary | ICD-10-CM | POA: Diagnosis not present

## 2024-04-09 DIAGNOSIS — Z794 Long term (current) use of insulin: Secondary | ICD-10-CM

## 2024-04-09 DIAGNOSIS — E119 Type 2 diabetes mellitus without complications: Secondary | ICD-10-CM

## 2024-04-09 DIAGNOSIS — I2699 Other pulmonary embolism without acute cor pulmonale: Principal | ICD-10-CM | POA: Diagnosis present

## 2024-04-09 DIAGNOSIS — E11621 Type 2 diabetes mellitus with foot ulcer: Secondary | ICD-10-CM | POA: Diagnosis present

## 2024-04-09 DIAGNOSIS — I10 Essential (primary) hypertension: Secondary | ICD-10-CM | POA: Diagnosis present

## 2024-04-09 DIAGNOSIS — Z7409 Other reduced mobility: Secondary | ICD-10-CM | POA: Insufficient documentation

## 2024-04-09 DIAGNOSIS — Z981 Arthrodesis status: Secondary | ICD-10-CM

## 2024-04-09 DIAGNOSIS — K219 Gastro-esophageal reflux disease without esophagitis: Secondary | ICD-10-CM | POA: Diagnosis present

## 2024-04-09 DIAGNOSIS — G4733 Obstructive sleep apnea (adult) (pediatric): Secondary | ICD-10-CM

## 2024-04-09 DIAGNOSIS — L97529 Non-pressure chronic ulcer of other part of left foot with unspecified severity: Secondary | ICD-10-CM | POA: Diagnosis present

## 2024-04-09 DIAGNOSIS — I5033 Acute on chronic diastolic (congestive) heart failure: Secondary | ICD-10-CM | POA: Diagnosis present

## 2024-04-09 DIAGNOSIS — Z8249 Family history of ischemic heart disease and other diseases of the circulatory system: Secondary | ICD-10-CM

## 2024-04-09 DIAGNOSIS — D631 Anemia in chronic kidney disease: Secondary | ICD-10-CM | POA: Diagnosis present

## 2024-04-09 DIAGNOSIS — I2510 Atherosclerotic heart disease of native coronary artery without angina pectoris: Secondary | ICD-10-CM | POA: Diagnosis present

## 2024-04-09 DIAGNOSIS — Z825 Family history of asthma and other chronic lower respiratory diseases: Secondary | ICD-10-CM

## 2024-04-09 DIAGNOSIS — Z7901 Long term (current) use of anticoagulants: Secondary | ICD-10-CM

## 2024-04-09 LAB — CBG MONITORING, ED
Glucose-Capillary: 92 mg/dL (ref 70–99)
Glucose-Capillary: 91 mg/dL (ref 70–99)

## 2024-04-09 LAB — COMPREHENSIVE METABOLIC PANEL WITH GFR
ALT: 16 U/L (ref 0–44)
AST: 19 U/L (ref 15–41)
Albumin: 3.2 g/dL — ABNORMAL LOW (ref 3.5–5.0)
Alkaline Phosphatase: 91 U/L (ref 38–126)
Anion gap: 15 (ref 5–15)
BUN: 84 mg/dL — ABNORMAL HIGH (ref 8–23)
CO2: 22 mmol/L (ref 22–32)
Calcium: 9 mg/dL (ref 8.9–10.3)
Chloride: 99 mmol/L (ref 98–111)
Creatinine, Ser: 2.44 mg/dL — ABNORMAL HIGH (ref 0.61–1.24)
GFR, Estimated: 26 mL/min — ABNORMAL LOW (ref >60)
Glucose, Bld: 86 mg/dL (ref 70–99)
Potassium: 4.6 mmol/L (ref 3.5–5.1)
Sodium: 136 mmol/L (ref 135–145)
Total Bilirubin: 0.8 mg/dL (ref 0.0–1.2)
Total Protein: 7.2 g/dL (ref 6.5–8.1)

## 2024-04-09 LAB — CBC WITH DIFFERENTIAL/PLATELET
Abs Immature Granulocytes: 0.02 10*3/uL (ref 0.00–0.07)
Basophils Absolute: 0 10*3/uL (ref 0.0–0.1)
Basophils Relative: 1 %
Eosinophils Absolute: 0.6 10*3/uL — ABNORMAL HIGH (ref 0.0–0.5)
Eosinophils Relative: 9 %
HCT: 35.6 % — ABNORMAL LOW (ref 39.0–52.0)
Hemoglobin: 11.5 g/dL — ABNORMAL LOW (ref 13.0–17.0)
Immature Granulocytes: 0 %
Lymphocytes Relative: 11 %
Lymphs Abs: 0.7 10*3/uL (ref 0.7–4.0)
MCH: 30.4 pg (ref 26.0–34.0)
MCHC: 32.3 g/dL (ref 30.0–36.0)
MCV: 94.2 fL (ref 80.0–100.0)
Monocytes Absolute: 0.9 10*3/uL (ref 0.1–1.0)
Monocytes Relative: 15 %
Neutro Abs: 4 10*3/uL (ref 1.7–7.7)
Neutrophils Relative %: 64 %
Platelets: 161 10*3/uL (ref 150–400)
RBC: 3.78 MIL/uL — ABNORMAL LOW (ref 4.22–5.81)
RDW: 13.5 % (ref 11.5–15.5)
WBC: 6.2 10*3/uL (ref 4.0–10.5)
nRBC: 0 % (ref 0.0–0.2)

## 2024-04-09 LAB — TROPONIN I (HIGH SENSITIVITY): Troponin I (High Sensitivity): 24 ng/L — ABNORMAL HIGH (ref <18)

## 2024-04-09 LAB — BRAIN NATRIURETIC PEPTIDE: B Natriuretic Peptide: 566.3 pg/mL — ABNORMAL HIGH (ref 0.0–100.0)

## 2024-04-09 LAB — MAGNESIUM: Magnesium: 1.6 mg/dL — ABNORMAL LOW (ref 1.7–2.4)

## 2024-04-09 LAB — I-STAT CG4 LACTIC ACID, ED: Lactic Acid, Venous: 1.1 mmol/L (ref 0.5–1.9)

## 2024-04-09 MED ORDER — SODIUM CHLORIDE 0.9 % IV BOLUS
500.0000 mL | Freq: Once | INTRAVENOUS | Status: AC
  Administered 2024-04-09: 500 mL via INTRAVENOUS

## 2024-04-09 MED ORDER — MIDODRINE HCL 5 MG PO TABS
2.5000 mg | ORAL_TABLET | Freq: Two times a day (BID) | ORAL | Status: DC
  Administered 2024-04-09 – 2024-04-10 (×2): 2.5 mg via ORAL
  Filled 2024-04-09 (×2): qty 1

## 2024-04-09 MED ORDER — INSULIN ASPART 100 UNIT/ML IJ SOLN
0.0000 [IU] | Freq: Three times a day (TID) | INTRAMUSCULAR | Status: DC
  Administered 2024-04-10 (×2): 2 [IU] via SUBCUTANEOUS
  Administered 2024-04-10: 3 [IU] via SUBCUTANEOUS
  Administered 2024-04-11 – 2024-04-12 (×5): 2 [IU] via SUBCUTANEOUS
  Administered 2024-04-13: 3 [IU] via SUBCUTANEOUS
  Administered 2024-04-13: 2 [IU] via SUBCUTANEOUS
  Administered 2024-04-13: 8 [IU] via SUBCUTANEOUS

## 2024-04-09 MED ORDER — ALBUTEROL SULFATE (2.5 MG/3ML) 0.083% IN NEBU
2.5000 mg | INHALATION_SOLUTION | Freq: Two times a day (BID) | RESPIRATORY_TRACT | Status: DC | PRN
  Administered 2024-04-10 – 2024-04-13 (×7): 2.5 mg via RESPIRATORY_TRACT
  Filled 2024-04-09 (×7): qty 3

## 2024-04-09 MED ORDER — MAGNESIUM SULFATE 2 GM/50ML IV SOLN
2.0000 g | Freq: Once | INTRAVENOUS | Status: AC
  Administered 2024-04-09: 2 g via INTRAVENOUS
  Filled 2024-04-09: qty 50

## 2024-04-09 MED ORDER — SODIUM CHLORIDE 0.9% FLUSH
3.0000 mL | Freq: Two times a day (BID) | INTRAVENOUS | Status: DC
  Administered 2024-04-09 – 2024-04-14 (×9): 3 mL via INTRAVENOUS

## 2024-04-09 MED ORDER — PANTOPRAZOLE SODIUM 40 MG PO TBEC
40.0000 mg | DELAYED_RELEASE_TABLET | Freq: Two times a day (BID) | ORAL | Status: DC
  Administered 2024-04-09 – 2024-04-14 (×10): 40 mg via ORAL
  Filled 2024-04-09 (×10): qty 1

## 2024-04-09 MED ORDER — ATORVASTATIN CALCIUM 80 MG PO TABS
80.0000 mg | ORAL_TABLET | Freq: Every day | ORAL | Status: DC
  Administered 2024-04-09 – 2024-04-13 (×5): 80 mg via ORAL
  Filled 2024-04-09: qty 1
  Filled 2024-04-09: qty 2
  Filled 2024-04-09 (×3): qty 1

## 2024-04-09 MED ORDER — ESCITALOPRAM OXALATE 10 MG PO TABS
10.0000 mg | ORAL_TABLET | Freq: Every morning | ORAL | Status: DC
  Administered 2024-04-10 – 2024-04-14 (×5): 10 mg via ORAL
  Filled 2024-04-09 (×7): qty 1

## 2024-04-09 MED ORDER — SODIUM CHLORIDE 0.9 % IV SOLN: Freq: Once | INTRAVENOUS | Status: AC

## 2024-04-09 NOTE — Assessment & Plan Note (Addendum)
 Chart review shows patient has chronic intermittent anemia since 2008 which I suspect is a combination of chronic GI loss and anemia of chronic disease.  Hemoglobin today is 11.5 suspect baseline to be around nines.    Latest Ref Rng & Units 04/09/2024    3:28 PM 12/29/2023    2:29 PM 12/27/2023    6:14 AM  CBC  WBC 4.0 - 10.5 K/uL 6.2  6.5  6.0   Hemoglobin 13.0 - 17.0 g/dL 16.1  09.6  9.8   Hematocrit 39.0 - 52.0 % 35.6  34.7  30.4   Platelets 150 - 400 K/uL 161  229  177

## 2024-04-09 NOTE — Assessment & Plan Note (Addendum)
 Attribute to patient's low blood pressure.  Differentials include medication related secondary to hypertensive effect or decreased p.o. intake or dehydration no GI losses reported no bleeding reported no palpitations headaches falls reported otherwise, Worsening CHF and reduced ejection fraction, do not suspect a vagal component to this.  Another less likely differential is sepsis from infection patient has a history of endocarditis we will follow 2D echo, will also obtain blood culture urine culture.  Fall precautions orthostatic vitals.  And physical therapy consult for safety assessment.  Patient started on low rate IV fluids blood pressure meds held.  Will monitor with neurochecks every 4 hours.do not suspect infectious but we will get blood and urine culture.

## 2024-04-09 NOTE — Assessment & Plan Note (Addendum)
 At baseline patient is set to have IPF and is to use as needed oxygen during the day and CPAP at night.

## 2024-04-09 NOTE — Assessment & Plan Note (Addendum)
 Lab Results  Component Value Date   CREATININE 2.44 (H) 04/09/2024   CREATININE 1.40 (H) 12/30/2023   CREATININE 1.60 (H) 12/29/2023  Will avoid contrast renally dose medications proceed with renal ultrasound and urinalysis along with urine culture.

## 2024-04-09 NOTE — ED Notes (Signed)
 [employer] pacemaker interrogated

## 2024-04-09 NOTE — ED Notes (Signed)
 Patient transported to Ultrasound

## 2024-04-09 NOTE — Assessment & Plan Note (Addendum)
 Currently patient's metoprolol  and Lasix  lisinopril  held due to hypotension. Vitals:   04/09/24 1444 04/09/24 1545 04/09/24 1600 04/09/24 1615  BP: (!) 95/54 (!) 93/51 (!) 94/42 (!) 92/52   04/09/24 1630 04/09/24 1645 04/09/24 1715 04/09/24 1845  BP: (!) 86/46 (!) 94/53 (!) 90/52 104/60

## 2024-04-09 NOTE — Assessment & Plan Note (Addendum)
 Patient has adrenal insufficiency in his chart will obtain a baseline cortisol level low threshold for hydrocortisone  overnight if needed for dropping blood pressures and low threshold for ICU for vasopressor support.

## 2024-04-09 NOTE — Assessment & Plan Note (Addendum)
 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  Per wife pt's physician uses doppler device to check his pulses in his feet.

## 2024-04-09 NOTE — H&P (Signed)
 History and Physical    Patient: Shane [name] ZOX:[id number] DOB: 1941/04/25 DOA: 04/09/2024 DOS: the patient was seen and examined on 04/09/2024 PCP: Suan Elm, MD  Patient coming from: Home Chief complaint: Chief Complaint  Patient presents with   Hypotension   HPI:  Shane [name] is a 83 y.o. male with past medical history  of  interstitial lung disease, CAD status post CABG, sick sinus syndrome with pacemaker in place, A-fib not on AC, CKD, CHF, diabetes, right BKA, aortic stenosis s/p TAVR , pt today morning was feeling dizzy and while in his car he started to experience dizziness when he was seated in the passenger seat started to get worse and he passed out when he came to he has a driver to call [phone] and when EMS was there patient passed out again but both times patient was in a seated position.  Patient did have nausea but no reports of vomiting.   No trauma to the head. Wife at bedside reports that patient has left foot wounds that are being managed by wound clinic here patient follows with vascular MD at [facility] who states that he has abnormal findings and they are to follow-up with him again in 3 months.  From description is very suspicious for chronic limb ischemia.  Patient has nonhealing wounds on his left foot and absent pedal pulses, I do not feel a right popliteal pulse either. Chart review shows patient was admitted in March for COVID-pneumonia and respiratory failure and patient also found to have CRRT and also found to have retroperitoneal hemorrhage, at which point patient's Eliquis  was discontinued.  Currently patient is on an aspirin .  Ejection fraction on most recent echo was 40%.  ED Course: Pt in ed at bedside  is alert awake oriented hypotensive. Vital signs in the ED were notable for the following:  Vitals:   04/09/24 1630 04/09/24 1645 04/09/24 1715 04/09/24 1845  BP: (!) 86/46 (!) 94/53 (!) 90/52 104/60  Pulse: 71 61 60 (!) 59  Temp:   97.9 F (36.6  C)   Resp: (!) 23 18 14 15   SpO2: 100% 98% 100% 99%  TempSrc:   Oral   >>ED evaluation thus far shows: CMP showing AKI with a creatinine of 2.4 BUN of 84 magnesium  1.6 EGFR of 26. CBC shows anemia with a hemoglobin of 11.5 normal white count normal platelet count. Cardiology consult was requested as patient was hypotensive and concerns for CHF and possible cardiorenal syndrome and for syncope. EKG today is ventricular paced rhythm with no P waves.  Pacemaker interrogation in the emergency room was reported to be normal function.  >>While in the ED patient received the following: Medications  sodium chloride  0.9 % bolus 500 mL (0 mLs Intravenous Stopped 04/09/24 1645)  sodium chloride  0.9 % bolus 500 mL (0 mLs Intravenous Stopped 04/09/24 1854)  magnesium  sulfate IVPB 2 g 50 mL (0 g Intravenous Stopped 04/09/24 1854)   Review of Systems  Constitutional: Negative.   HENT: Negative.    Respiratory:  Positive for shortness of breath.   Cardiovascular: Negative.   Gastrointestinal: Negative.   Genitourinary: Negative.   Musculoskeletal: Negative.   Neurological:  Positive for dizziness and loss of consciousness. Negative for tingling, speech change, focal weakness, seizures, weakness and headaches.   Past Medical History:  Diagnosis Date   Acute blood loss anemia    Anxiety    AV block, Mobitz 1    Cataract    Chronic kidney disease  d/t DM   CKD (chronic kidney disease), stage III (HCC)    COVID-19 virus infection 07/18/2020   Last Assessment & Plan:   Formatting of this note might be different from the original.  Immunocompromise high risk patient  -ID consulted for further therapies and will administer regeneron as OP tomorrow   -Decadron  discontinued as patient is off O2     Depression    Diabetes mellitus    Vgo disposal insulin  bolus  simular to insulin  pump   Dyspnea    GERD (gastroesophageal reflux disease)    History of kidney stones    passed   Hyperlipidemia     Hypertension    Idiopathic pulmonary fibrosis (HCC) 11/2016   ILD (interstitial lung disease) (HCC)    Moderate aortic stenosis    a. 10/2019 Echo: EF 55-60%, Gr2 DD. Nl RV.    Neuromuscular disorder (HCC)    Neuropathy associated with endocrine disorder (HCC)    Nonobstructive CAD (coronary artery disease)    a. 2012 Cath: mod, nonobs dzs; b. 10/2016 MV: EF 60%, no ischemia.   OSA on CPAP 05/05/2017   Unattended Home Sleep Test 7/2/813-AHI 38.6/hour, desaturation to 64%, body weight 261 pounds   PONV (postoperative nausea and vomiting)    Postoperative anemia due to acute blood loss 11/07/2020   Postoperative hemorrhagic shock 03/20/2021   Sleep apnea     uses cpap asked to bring mask and tubing   Past Surgical History:  Procedure Laterality Date   ABDOMINAL AORTOGRAM W/LOWER EXTREMITY N/A 12/10/2020   Procedure: ABDOMINAL AORTOGRAM W/LOWER EXTREMITY;  Surgeon: Carlene Che, MD;  Location: [facility] INVASIVE CV LAB;  Service: Cardiovascular;  Laterality: N/A;   AMPUTATION Right 01/22/2021   Procedure: 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;  Surgeon: Timothy Ford, MD;  Location: [facility] OR;  Service: Orthopedics;  Laterality: Right;   AMPUTATION Right 03/17/2021   Procedure: RIGHT BELOW KNEE AMPUTATION;  Surgeon: Timothy Ford, MD;  Location: [facility] OR;  Service: Orthopedics;  Laterality: Right;   ANKLE FUSION Right 01/22/2021   Procedure: RIGHT FOOT TIBIOCALCANEAL FUSION;  Surgeon: Timothy Ford, MD;  Location: [facility] OR;  Service: Orthopedics;  Laterality: Right;   ANTERIOR FUSION CERVICAL SPINE  2012   CARDIAC CATHETERIZATION  2011   CARDIAC CATHETERIZATION N/A 11/09/2016   Procedure: Right Heart Cath;  Surgeon: Arty Binning, MD;  Location: [facility] INVASIVE CV LAB;  Service: Cardiovascular;  Laterality: N/A;   carpel tunnel     left wrist   CATARACT EXTRACTION     CATARACT EXTRACTION W/ INTRAOCULAR LENS  IMPLANT, BILATERAL  2013   CERVICAL LAMINECTOMY  2012   COLONOSCOPY N/A 01/14/2013   Procedure: COLONOSCOPY;   Surgeon: Tobin Forts, MD;  Location: [facility] ENDOSCOPY;  Service: Endoscopy;  Laterality: N/A;   CORONARY ARTERY BYPASS GRAFT  11/04/2020   LIMA-LAD, SVG-OM1, SVG-PDA (Dr Silver Dross [facility]) dc 11/18/2020   EYE SURGERY     I & D EXTREMITY Right 02/19/2021   Procedure: RIGHT ANKLE DEBRIDEMENT AND PLACEMENT ANTIBIOTIC BEADS;  Surgeon: Timothy Ford, MD;  Location: [facility] OR;  Service: Orthopedics;  Laterality: Right;   KNEE SURGERY  1998   left   LEFT HEART CATH AND CORONARY ANGIOGRAPHY N/A 07/10/2020   Procedure: LEFT HEART CATH AND CORONARY ANGIOGRAPHY;  Surgeon: Arnoldo Lapping, MD;  Location: [facility] INVASIVE CV LAB;  Service: Cardiovascular;  Laterality: N/A;   LUMBAR LAMINECTOMY  2003   LUNG BIOPSY Left 12/26/2016   Procedure: LUNG BIOPSY;  Surgeon: Zelphia Higashi, MD;  Location: [facility] OR;  Service: Thoracic;  Laterality: Left;   PACEMAKER IMPLANT N/A 03/30/2020   Procedure: PACEMAKER IMPLANT;  Surgeon: Tammie Fall, MD;  Location: [facility] INVASIVE CV LAB;  Service: Cardiovascular;  Laterality: N/A;   PERIPHERAL VASCULAR INTERVENTION Right 12/10/2020   Procedure: PERIPHERAL VASCULAR INTERVENTION;  Surgeon: Carlene Che, MD;  Location: [facility] INVASIVE CV LAB;  Service: Cardiovascular;  Laterality: Right;  SFA   POSTERIOR FUSION CERVICAL SPINE  2012   PPM GENERATOR REMOVAL N/A 12/14/2020   Procedure: PPM GENERATOR REMOVAL;  Surgeon: Tammie Fall, MD;  Location: [facility] INVASIVE CV LAB;  Service: Cardiovascular;  Laterality: N/A;   TEE WITHOUT CARDIOVERSION N/A 12/11/2020   Procedure: TRANSESOPHAGEAL ECHOCARDIOGRAM (TEE);  Surgeon: Harrold Lincoln, MD;  Location: [facility] ENDOSCOPY;  Service: Cardiovascular;  Laterality: N/A;   TRIGGER FINGER RELEASE  2011   4th finger left hand   VIDEO ASSISTED THORACOSCOPY Left 12/26/2016   Procedure: VIDEO ASSISTED THORACOSCOPY;  Surgeon: Zelphia Higashi, MD;  Location: [facility] OR;  Service: Thoracic;  Laterality: Left;   VIDEO BRONCHOSCOPY N/A 12/26/2016   Procedure: VIDEO  BRONCHOSCOPY;  Surgeon: Zelphia Higashi, MD;  Location: [facility] OR;  Service: Thoracic;  Laterality: N/A;    reports that he has never smoked. He has never used smokeless tobacco. He reports that he does not drink alcohol and does not use drugs. Allergies  Allergen Reactions   Codeine Hives and Itching   Ofev  [Nintedanib ] Diarrhea    SEVERE DIARRHEA   Pirfenidone  Diarrhea and Other (See Comments)    Esbriet  (Pirfenidone ) causes elevated LFTs. D/C on 06/14/17 and SEVERE DIARRHEA    Family History  Problem Relation Age of Onset   Diabetes Mellitus II Mother    Emphysema Father 52   Heart attack Father    Colon cancer Neg Hx    Esophageal cancer Neg Hx    Rectal cancer Neg Hx    Stomach cancer Neg Hx    Prior to Admission medications   Medication Sig Start Date End Date Taking? Authorizing Provider  Acetaminophen  (TYLENOL  PO) Take 2 tablets by mouth as needed. May take 2 tablet by mouth 3 hours after taking the trazodone  75mg  prn.   Yes [provider]  albuterol  (PROVENTIL ) (2.5 MG/3ML) 0.083% nebulizer solution Take 2.5 mg by nebulization 2 (two) times daily as needed for wheezing or shortness of breath.   Yes [provider]  albuterol  (VENTOLIN  HFA) 108 (90 Base) MCG/ACT inhaler Inhale 2 puffs into the lungs every 4 (four) hours as needed for wheezing or shortness of breath. Patient taking differently: Inhale 3 puffs into the lungs every 4 (four) hours as needed for wheezing or shortness of breath. 04/12/21  Yes Angiulli, Everlyn Hockey, PA-C  Ascorbic Acid  (VITAMIN C  WITH ROSE HIPS) 500 MG tablet Take 1 tablet (500 mg total) by mouth daily. Patient taking differently: Take 500 mg by mouth in the morning. 04/12/21  Yes Angiulli, Everlyn Hockey, PA-C  atorvastatin  (LIPITOR ) 80 MG tablet TAKE 1 TABLET BY MOUTH EVERY DAY Patient taking differently: Take 80 mg by mouth at bedtime. 07/25/22  Yes Lenise Quince, MD  benzonatate  (TESSALON ) 100 MG capsule Take 1 capsule (100 mg total) by  mouth 3 (three) times daily. Patient taking differently: Take 100 mg by mouth as needed for cough. 12/29/23  Yes Kraig Peru, MD  cyanocobalamin  1000 MCG tablet Take 1 tablet (1,000 mcg total) by mouth daily. Patient taking differently: Take  1,000 mcg by mouth in the morning. 12/30/23  Yes Kraig Peru, MD  dextromethorphan  (DELSYM ) 30 MG/5ML liquid Take 2.5 mLs (15 mg total) by mouth 2 (two) times daily. Patient taking differently: Take 15 mg by mouth at bedtime. 12/29/23  Yes Kraig Peru, MD  docusate sodium  (COLACE) 100 MG capsule Take 1 capsule (100 mg total) by mouth 2 (two) times daily. Patient taking differently: Take 100 mg by mouth as needed for mild constipation or moderate constipation. 12/29/23  Yes Kraig Peru, MD  escitalopram  (LEXAPRO ) 10 MG tablet Take 1 tablet (10 mg total) by mouth daily. Patient taking differently: Take 10 mg by mouth in the morning. 04/12/21  Yes Angiulli, Everlyn Hockey, PA-C  furosemide  (LASIX ) 20 MG tablet Take 20 mg by mouth daily at 12 noon.   Yes [provider]  furosemide  (LASIX ) 40 MG tablet Take 1 tablet (40 mg total) by mouth daily. Patient taking differently: Take 40 mg by mouth 2 (two) times daily. 12/30/23 12/29/24 Yes Kraig Peru, MD  gabapentin  (NEURONTIN ) 100 MG capsule Take 1 capsule (100 mg total) by mouth 3 (three) times daily. 12/29/23  Yes Kraig Peru, MD  HUMALOG  KWIKPEN 100 UNIT/ML KwikPen Inject 10-15 Units into the skin 3 (three) times daily. Depending what his blood sugar reading is   Yes [provider]  HYDROcodone  bit-homatropine (HYCODAN) 5-1.5 MG/5ML syrup Take 5 mLs by mouth every 6 (six) hours as needed for cough. 12/29/23  Yes Kraig Peru, MD  HYDROcodone -acetaminophen  (NORCO/VICODIN) 5-325 MG tablet Take 1 tablet by mouth at bedtime as needed.   Yes [provider]  lisinopril  (ZESTRIL ) 10 MG tablet Take 10 mg by mouth in the morning. 02/15/24  Yes [provider]  LORazepam   (ATIVAN ) 1 MG tablet Take 1 tablet (1 mg total) by mouth at bedtime as needed for sleep. 12/29/23  Yes Kraig Peru, MD  methocarbamol  (ROBAXIN ) 500 MG tablet Take 1 tablet (500 mg total) by mouth every 8 (eight) hours as needed for muscle spasms. 12/29/23  Yes Kraig Peru, MD  metoprolol  succinate (TOPROL -XL) 25 MG 24 hr tablet Take 1 tablet (25 mg total) by mouth daily. Patient taking differently: Take 25 mg by mouth in the morning. 12/30/23  Yes Kraig Peru, MD  Multiple Vitamin (MULTIVITAMIN WITH MINERALS) TABS tablet Take 1 tablet by mouth in the morning.   Yes [provider]  mupirocin  ointment (BACTROBAN ) 2 % Apply 1 Application topically at bedtime. 06/06/23  Yes [provider]  Nutritional Supplements (FEEDING SUPPLEMENT, NEPRO CARB STEADY,) LIQD Take 237 mLs by mouth 2 (two) times daily between meals. 12/29/23  Yes Kraig Peru, MD  Olopatadine HCl (PATADAY OP) Place 2 drops into both eyes daily as needed (allergies).   Yes [provider]  ondansetron  (ZOFRAN ) 4 MG tablet Take 4 mg by mouth as needed for nausea or vomiting. 06/15/23  Yes [provider]  OXYGEN Inhale 3 L into the lungs See admin instructions. Inhale 3L of Oxygen everynight at bedside. May uses throughout the day as needed.   Yes [provider]  pantoprazole  (PROTONIX ) 40 MG tablet Take 1 tablet (40 mg total) by mouth 2 (two) times daily. 04/12/21  Yes Angiulli, Daniel J, PA-C  polyethylene glycol powder (GLYCOLAX /MIRALAX ) 17 GM/SCOOP powder Take 17 g by mouth as needed for mild constipation or moderate constipation. 11/15/20  Yes [provider]  traZODone  (DESYREL ) 50 MG tablet Take 75 mg by mouth  at bedtime as needed for sleep. 05/11/23  Yes [provider]  TRESIBA  FLEXTOUCH 100 UNIT/ML FlexTouch Pen Inject 45 Units into the skin daily. Patient taking differently: Inject 20 Units into the skin in the morning. 12/29/23  Yes Kraig Peru, MD  zinc   sulfate 220 (50 Zn) MG capsule Take 1 capsule (220 mg total) by mouth daily. Patient taking differently: Take 220 mg by mouth in the morning. 04/12/21  Yes Sterling Eisenmenger, PA-C                                                                                 Vitals:   04/09/24 1630 04/09/24 1645 04/09/24 1715 04/09/24 1845  BP: (!) 86/46 (!) 94/53 (!) 90/52 104/60  Pulse: 71 61 60 (!) 59  Resp: (!) 23 18 14 15   Temp:   97.9 F (36.6 C)   TempSrc:   Oral   SpO2: 100% 98% 100% 99%   Physical Exam Vitals and nursing note reviewed.  Constitutional:      General: He is not in acute distress. HENT:     Head: Normocephalic and atraumatic.     Right Ear: Hearing normal.     Left Ear: Hearing normal.     Nose: No nasal deformity.     Mouth/Throat:     Lips: Pink.  Eyes:     General: Lids are normal.     Extraocular Movements: Extraocular movements intact.  Cardiovascular:     Rate and Rhythm: Normal rate and regular rhythm.     Pulses:          Popliteal pulses are 0 on the right side.       Dorsalis pedis pulses are 0 on the left side.       Posterior tibial pulses are 0 on the left side.     Heart sounds: Normal heart sounds.  Pulmonary:     Effort: Pulmonary effort is normal.     Breath sounds: Normal breath sounds.  Abdominal:     General: Bowel sounds are normal. There is no distension.     Palpations: Abdomen is soft. There is no mass.     Tenderness: There is no abdominal tenderness.  Musculoskeletal:     Right lower leg: No edema.     Left lower leg: No edema.       Legs:     Right Lower Extremity: Right leg is amputated below knee.  Feet:     Left foot:     Skin integrity: Ulcer and skin breakdown present.  Skin:    General: Skin is warm.  Neurological:     General: No focal deficit present.     Mental Status: He is alert and oriented to person, place, and time.     Cranial Nerves: Cranial nerves 2-12 are intact.  Psychiatric:        Speech: Speech normal.      Labs on Admission: I have personally reviewed following labs and imaging studies CBC: Recent Labs  Lab 04/09/24 1528  WBC 6.2  NEUTROABS 4.0  HGB 11.5*  HCT 35.6*  MCV 94.2  PLT 161   Basic Metabolic Panel: Recent  Labs  Lab 04/09/24 1528  NA 136  K 4.6  CL 99  CO2 22  GLUCOSE 86  BUN 84*  CREATININE 2.44*  CALCIUM  9.0  MG 1.6*   GFR: CrCl cannot be calculated (Unknown ideal weight.). Liver Function Tests: Recent Labs  Lab 04/09/24 1528  AST 19  ALT 16  ALKPHOS 91  BILITOT 0.8  PROT 7.2  ALBUMIN  3.2*   No results for input(s): "LIPASE", "AMYLASE" in the last 168 hours. No results for input(s): "AMMONIA" in the last 168 hours. Coagulation Profile: No results for input(s): "INR", "PROTIME" in the last 168 hours. Cardiac Enzymes: No results for input(s): "CKTOTAL", "CKMB", "CKMBINDEX", "TROPONINI" in the last 168 hours. BNP (last 3 results) No results for input(s): "PROBNP" in the last 8760 hours. HbA1C: No results for input(s): "HGBA1C" in the last 72 hours. CBG: Recent Labs  Lab 04/09/24 1531 04/09/24 1845  GLUCAP 92 91   Lipid Profile: No results for input(s): "CHOL", "HDL", "LDLCALC", "TRIG", "CHOLHDL", "LDLDIRECT" in the last 72 hours. Thyroid Function Tests: No results for input(s): "TSH", "T4TOTAL", "FREET4", "T3FREE", "THYROIDAB" in the last 72 hours. Anemia Panel: No results for input(s): "VITAMINB12", "FOLATE", "FERRITIN", "TIBC", "IRON ", "RETICCTPCT" in the last 72 hours. Urine analysis:    Component Value Date/Time   COLORURINE YELLOW 12/06/2023 2141   APPEARANCEUR HAZY (A) 12/06/2023 2141   LABSPEC 1.017 12/06/2023 2141   PHURINE 5.0 12/06/2023 2141   GLUCOSEU 50 (A) 12/06/2023 2141   HGBUR NEGATIVE 12/06/2023 2141   BILIRUBINUR NEGATIVE 12/06/2023 2141   KETONESUR NEGATIVE 12/06/2023 2141   PROTEINUR NEGATIVE 12/06/2023 2141   UROBILINOGEN 1.0 09/27/2011 1112   NITRITE NEGATIVE 12/06/2023 2141   LEUKOCYTESUR NEGATIVE  12/06/2023 2141   Radiological Exams on Admission: CT Head Wo Contrast Result Date: 04/09/2024 CLINICAL DATA:  Syncope/presyncope, cerebrovascular cause suspected EXAM: CT HEAD WITHOUT CONTRAST TECHNIQUE: Contiguous axial images were obtained from the base of the skull through the vertex without intravenous contrast. RADIATION DOSE REDUCTION: This exam was performed according to the departmental dose-optimization program which includes automated exposure control, adjustment of the mA and/or kV according to patient size and/or use of iterative reconstruction technique. COMPARISON:  CT head April 24, 21. FINDINGS: Brain: No evidence of acute infarction, hemorrhage, hydrocephalus, extra-axial collection or mass lesion/mass effect. Vascular: No hyperdense vessel identified. Skull: No acute fracture. Sinuses/Orbits: Clear sinuses.  No acute orbital findings. Other: No mastoid effusions. IMPRESSION: No evidence of acute intracranial abnormality. Electronically Signed   By: Stevenson Elbe M.D.   On: 04/09/2024 20:49   DG Chest 2 View Result Date: 04/09/2024 CLINICAL DATA:  Syncope. EXAM: CHEST - 2 VIEW COMPARISON:  December 07, 2023 FINDINGS: Multiple sternal wires and vascular clips are seen. The cardiac silhouette is enlarged and unchanged in size. An artificial aortic valve is present. Low lung volumes are noted with mild prominence of the pulmonary vasculature. There is no evidence of focal consolidation or pneumothorax. A small left pleural effusion is suspected. Postoperative changes are seen throughout the cervical spine with multilevel degenerative changes noted throughout the thoracic spine. IMPRESSION: 1. Evidence of prior median sternotomy/CABG. 2. Low lung volumes with mild pulmonary vascular congestion. 3. Small left pleural effusion. Electronically Signed   By: Virgle Grime M.D.   On: 04/09/2024 16:18   Data Reviewed: Relevant notes from primary care and specialist visits, past discharge  summaries as available in EHR, including Care Everywhere. Prior diagnostic testing as pertinent to current admission diagnoses, Updated medications and problem lists for  reconciliation ED course, including vitals, labs, imaging, treatment and response to treatment,Triage notes, nursing and pharmacy notes and ED provider's notes Notable results as noted in HPI.Discussed case with EDMD/ ED APP/ or Specialty MD on call and as needed.  Assessment & Plan Syncope and collapse Attribute to patient's low blood pressure.  Differentials include medication related secondary to hypertensive effect or decreased p.o. intake or dehydration no GI losses reported no bleeding reported no palpitations headaches falls reported otherwise, Worsening CHF and reduced ejection fraction, do not suspect a vagal component to this.  Another less likely differential is sepsis from infection patient has a history of endocarditis we will follow 2D echo, will also obtain blood culture urine culture.  Fall precautions orthostatic vitals.  And physical therapy consult for safety assessment.  Patient started on low rate IV fluids blood pressure meds held.  Will monitor with neurochecks every 4 hours.do not suspect infectious but we will get blood and urine culture. Elevated troponin level not due myocardial infarction Suspect most likely from demand from CHF or PE. Another likely cause is AKI.  Will follow.  Orthostatic hypotension As above orthostatic vitals.  Midodrine at low dose of 2.5 twice daily started. Endocarditis History of endocarditis will follow patient's blood cultures and 2D echo.  Acute kidney injury superimposed on stage 3a chronic kidney disease (HCC) Lab Results  Component Value Date   CREATININE 2.44 (H) 04/09/2024   CREATININE 1.40 (H) 12/30/2023   CREATININE 1.60 (H) 12/29/2023  Will avoid contrast renally dose medications proceed with renal ultrasound and urinalysis along with urine culture.  Diabetic  foot ulcer (HCC) Wound care consult, ABI, suspect patient has chronic limb ischemia and will need angiogram and intervention. Patient is not a good candidate for antiplatelet or anticoagulation therapy. Type 2 diabetes mellitus without complication, with long-term current use of insulin  (HCC) Glycemic protocol.  Will hold home medications of Tresiba  currently.  A1c previous 1 was 8.5. 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HCC) At baseline patient is set to have IPF and is to use as needed oxygen during the day and CPAP at night. OSA on CPAP CPAP continue per home settings. Essential hypertension Currently patient's metoprolol  and Lasix  lisinopril  held due to hypotension. Vitals:   04/09/24 1444 04/09/24 1545 04/09/24 1600 04/09/24 1615  BP: (!) 95/54 (!) 93/51 (!) 94/42 (!) 92/52   04/09/24 1630 04/09/24 1645 04/09/24 1715 04/09/24 1845  BP: (!) 86/46 (!) 94/53 (!) 90/52 104/60    Coronary artery disease involving native heart without angina pectoris/ S/P CABG x3 No anginal symptoms of chest pain chest pressure baseline shortness of breath no worsening per patient. Troponin is elevated 24, BNP is 566.3.  Appreciate cardiology consult and management. Currently patient is on aspirin  and statin therapy we will currently have to hold patient's beta-blocker and lisinopril  therapy because of AKI on CKD and hypotension and syncopal episode. Adrenal insufficiency (HCC) Patient has adrenal insufficiency in his chart will obtain a baseline cortisol level low threshold for hydrocortisone  overnight if needed for dropping blood pressures and low threshold for ICU for vasopressor support. PAD (peripheral artery disease) (HCC) Suspect pt has chronic limb ischemia.  Per wife pt's physician uses doppler device to check his pulses in his feet.   Anemia Chart review shows patient has chronic intermittent anemia since 2008 which I suspect is a combination of chronic GI loss and anemia of chronic disease.   Hemoglobin today is 11.5 suspect baseline to be around nines.    Latest Ref  Rng & Units 04/09/2024    3:28 PM 12/29/2023    2:29 PM 12/27/2023    6:14 AM  CBC  WBC 4.0 - 10.5 K/uL 6.2  6.5  6.0   Hemoglobin 13.0 - 17.0 g/dL 09.8  11.9  9.8   Hematocrit 39.0 - 52.0 % 35.6  34.7  30.4   Platelets 150 - 400 K/uL 161  229  177    DVT prophylaxis:  None  Consults:  Cardiology.  Advance Care Planning:    Code Status: Full Code   Family Communication:  Wife at bedside. Disposition Plan:  Home.  Severity of Illness: The appropriate patient status for this patient is INPATIENT. Inpatient status is judged to be reasonable and necessary in order to provide the required intensity of service to ensure the patient's safety. The patient's presenting symptoms, physical exam findings, and initial radiographic and laboratory data in the context of their chronic comorbidities is felt to place them at high risk for further clinical deterioration. Furthermore, it is not anticipated that the patient will be medically stable for discharge from the hospital within 2 midnights of admission.   * I certify that at the point of admission it is my clinical judgment that the patient will require inpatient hospital care spanning beyond 2 midnights from the point of admission due to high intensity of service, high risk for further deterioration and high frequency of surveillance required.*  Unresulted Labs (From admission, onward)     Start     Ordered   04/09/24 2054  Cortisol  Once,   R        04/09/24 2053   04/09/24 2048  Creatinine, urine, random  Once,   URGENT        04/09/24 2047   04/09/24 2047  CK  Add-on,   AD        04/09/24 2046   04/09/24 2047  Sodium, urine, random  Once,   URGENT        04/09/24 2047   04/09/24 2044  Culture, blood (Routine X 2) w Reflex to ID Panel  BLOOD CULTURE X 2,   R      04/09/24 2043   04/09/24 2044  Urinalysis, Routine w reflex microscopic -Urine, Random  Once,   URGENT        Question:  Specimen Source  Answer:  Urine, Random   04/09/24 2043   04/09/24 2033  Hemoglobin and hematocrit, blood  Now then every 4 hours,   R      04/09/24 2032   04/09/24 2033  Hemoglobin A1c  Once,   URGENT       Comments: To assess prior glycemic control    04/09/24 2032            Orders Placed This Encounter  Procedures   Culture, blood (Routine X 2) w Reflex to ID Panel   DG Chest 2 View   CT Head Wo Contrast   NM Pulmonary Perfusion   US  RENAL   CBC WITH DIFFERENTIAL   Comprehensive metabolic panel   Magnesium    Brain natriuretic peptide   Hemoglobin and hematocrit, blood   Hemoglobin A1c   Urinalysis, Routine w reflex microscopic -Urine, Random   CK   Sodium, urine, random   Creatinine, urine, random   Cortisol   Diet Carb Modified Fluid consistency: Thin; Room service appropriate? Yes   ED Cardiac monitoring   Interrogate Pacemaker if present   Initiate Carrier Fluid Protocol   Orthostatic  vital signs   Wound care   Apply Diabetes Mellitus Care Plan   STAT CBG when hypoglycemia is suspected. If treated, recheck every 15 minutes after each treatment until CBG >/= 70 mg/dl   Refer to Hypoglycemia Protocol Sidebar Report for treatment of CBG < 70 mg/dl   No HS correction Insulin    Cardiac monitoring   Maintain IV access   Vital signs   Orthostatic vital signs on admission   Notify physician (specify)   Refer to Sidebar Report Refer to ICU, Med-Surg, Progressive, and Step-Down Mobility Protocol Sidebars   Daily weights   Intake and output   Apply Syncope Care Plan   Initiate Oral Care Protocol   Initiate Carrier Fluid Protocol   Ambulate with assistance   Neuro checks   Full code   Consult to hospitalist   Inpatient consult to Cardiology   CPAP   CBG monitoring, ED   CBG monitoring, ED   ED EKG   EKG 12-Lead   EKG 12-Lead   ECHOCARDIOGRAM LIMITED   Admit to Inpatient (patient's expected length of stay will be greater than 2 midnights  or inpatient only procedure)   Skin care precautions   Aspiration precautions   Fall precautions   VAS US  ABI WITH/WO TBI    Author: Lavanda Porter, MD 12 pm -8 pm. 04/09/2024 9:13 PM >>Please note for any concern,or critical results after hours past 8pm please contact the [facility] hospitalist [facility] floor coverage provider from 7 PM- 7 AM. For on call review [email or web address], username [email or web address] and PW: your phone number<<

## 2024-04-09 NOTE — Assessment & Plan Note (Addendum)
 As above orthostatic vitals.  Midodrine at low dose of 2.5 twice daily started.

## 2024-04-09 NOTE — Assessment & Plan Note (Addendum)
 Glycemic protocol.  Will hold home medications of Tresiba  currently.  A1c previous 1 was 8.5.

## 2024-04-09 NOTE — Assessment & Plan Note (Addendum)
 Suspect most likely from demand from CHF or PE. Another likely cause is AKI.  Will follow.

## 2024-04-09 NOTE — ED Triage Notes (Signed)
 Pt BIB [facility] with c/o dizziness, vision changes, nausea and syncopal episode with LOC. BP per EMS is 90/50. 4mg  Zofran  given by EMS. All other VSS.

## 2024-04-09 NOTE — Assessment & Plan Note (Addendum)
 CPAP continue per home settings.

## 2024-04-09 NOTE — ED Provider Notes (Signed)
 [name] Provider Note   CSN: [id number] Arrival date & time: 04/09/24  1435     History  Chief Complaint  Patient presents with  . Hypotension    Shane [name] is a 83 y.o. male.  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  Patient states that he woke up today feeling unwell stating that he felt "dizzy and lightheaded".  He states that he also had a decreased appetite but still ate breakfast and lunch today.  He states that he went to one of his rental homes today to assess for needs and repairs and started to have worsening dizziness and started walking back to his car.  He states he got about 2 blocks down when he passed out.  He states that his vision went white before he passed out.  He states he felt nauseous but did not vomit.  Denies any chest pain or shortness of breath.  He denies hitting his head upon passing out.  He states he was otherwise feeling like his normal self over the last couple of days.  The history is provided by the patient and the spouse.       Home Medications Prior to Admission medications   Medication Sig Start Date End Date Taking? Authorizing Provider  albuterol  (PROVENTIL ) (2.5 MG/3ML) 0.083% nebulizer solution Take 2.5 mg by nebulization 2 (two) times daily as needed for wheezing or shortness of breath.    [provider]  albuterol  (VENTOLIN  HFA) 108 (90 Base) MCG/ACT inhaler Inhale 2 puffs into the lungs every 4 (four) hours as needed for wheezing or shortness of breath. Patient taking differently: Inhale 3 puffs into the lungs every 4 (four) hours as needed for wheezing or shortness of breath. 04/12/21   Angiulli, Everlyn Hockey, PA-C  Ascorbic Acid  (VITAMIN C  WITH  ROSE HIPS) 500 MG tablet Take 1 tablet (500 mg total) by mouth daily. 04/12/21   Angiulli, Everlyn Hockey, PA-C  atorvastatin  (LIPITOR ) 80 MG tablet TAKE 1 TABLET BY MOUTH EVERY DAY Patient taking differently: Take 80 mg by mouth at bedtime. 07/25/22   Lenise Quince, MD  benzonatate  (TESSALON ) 100 MG capsule Take 1 capsule (100 mg total) by mouth 3 (three) times daily. 12/29/23   Kraig Peru, MD  cyanocobalamin  1000 MCG tablet Take 1 tablet (1,000 mcg total) by mouth daily. 12/30/23   Kraig Peru, MD  dextromethorphan  (DELSYM ) 30 MG/5ML liquid Take 2.5 mLs (15 mg total) by mouth 2 (two) times daily. 12/29/23   Kraig Peru, MD  docusate sodium  (COLACE) 100 MG capsule Take 1 capsule (100 mg total) by mouth 2 (two) times daily. 12/29/23   Kraig Peru, MD  escitalopram  (LEXAPRO ) 10 MG tablet Take 1 tablet (10 mg total) by mouth daily. 04/12/21   Angiulli, Everlyn Hockey, PA-C  FARXIGA  10 MG TABS tablet Take 10 mg by mouth daily.    [provider]  furosemide  (LASIX ) 40 MG tablet Take 1 tablet (40 mg total) by mouth daily. 12/30/23 12/29/24  Kraig Peru, MD  gabapentin  (NEURONTIN ) 100 MG capsule Take 1 capsule (100 mg total) by mouth 3 (three) times daily. 12/29/23   Kraig Peru, MD  HUMALOG  KWIKPEN 100 UNIT/ML KwikPen Inject 10-15 Units into  the skin 3 (three) times daily. Depending what his blood sugar reading is    [provider]  HYDROcodone  bit-homatropine (HYCODAN) 5-1.5 MG/5ML syrup Take 5 mLs by mouth every 6 (six) hours as needed for cough. 12/29/23   Kraig Peru, MD  lidocaine  (LIDODERM ) 5 % Place 1 patch onto the skin daily. Remove & Discard patch within 12 hours or as directed by MD 12/29/23   Kraig Peru, MD  LORazepam  (ATIVAN ) 1 MG tablet Take 1 tablet (1 mg total) by mouth at bedtime as needed for sleep. 12/29/23   Kraig Peru, MD  methocarbamol  (ROBAXIN ) 500 MG tablet Take 1 tablet (500 mg total) by mouth every 8 (eight) hours as needed for muscle spasms.  12/29/23   Kraig Peru, MD  metoprolol  succinate (TOPROL -XL) 25 MG 24 hr tablet Take 1 tablet (25 mg total) by mouth daily. 12/30/23   Kraig Peru, MD  Multiple Vitamin (MULTIVITAMIN WITH MINERALS) TABS tablet Take 1 tablet by mouth daily.    [provider]  multivitamin (RENA-VIT) TABS tablet Take 1 tablet by mouth at bedtime. 12/29/23   Patel, Pranav M, MD  mupirocin  ointment (BACTROBAN ) 2 % Apply 1 Application topically at bedtime. 06/06/23   [provider]  Nutritional Supplements (FEEDING SUPPLEMENT, NEPRO CARB STEADY,) LIQD Take 237 mLs by mouth 2 (two) times daily between meals. 12/29/23   Patel, Pranav M, MD  Olopatadine HCl (PATADAY OP) Place 2 drops into both eyes daily as needed (allergies).    [provider]  ondansetron  (ZOFRAN ) 4 MG tablet Take 4 mg by mouth as needed for nausea or vomiting. 06/15/23   [provider]  pantoprazole  (PROTONIX ) 40 MG tablet Take 1 tablet (40 mg total) by mouth 2 (two) times daily. Patient taking differently: Take 40 mg by mouth daily. 04/12/21   Angiulli, Daniel J, PA-C  polyethylene glycol powder (GLYCOLAX /MIRALAX ) 17 GM/SCOOP powder Take 17 g by mouth every other day. 11/15/20   [provider]  traZODone  (DESYREL ) 50 MG tablet Take 75 mg by mouth at bedtime as needed for sleep. 05/11/23   [provider]  TRESIBA  FLEXTOUCH 100 UNIT/ML FlexTouch Pen Inject 45 Units into the skin daily. 12/29/23   Kraig Peru, MD  zinc  sulfate 220 (50 Zn) MG capsule Take 1 capsule (220 mg total) by mouth daily. 04/12/21   Angiulli, Everlyn Hockey, PA-C      Allergies    Codeine, Ofev  [nintedanib ], and Pirfenidone     Review of Systems   Review of Systems  Physical Exam Updated Vital Signs BP (!) 90/52   Pulse 60   Temp 97.9 F (36.6 C) (Oral)   Resp 14   SpO2 100%  Physical Exam Vitals and nursing note reviewed.  Constitutional:      General: He is not in acute distress.    Appearance: Normal appearance.   HENT:     Head: Normocephalic and atraumatic.     Nose: Nose normal.     Mouth/Throat:     Mouth: Mucous membranes are moist.     Pharynx: Oropharynx is clear.  Eyes:     Extraocular Movements: Extraocular movements intact.     Conjunctiva/sclera: Conjunctivae normal.  Cardiovascular:     Rate and Rhythm: Normal rate and regular rhythm.     Heart sounds: Normal heart sounds.  Pulmonary:     Effort: Pulmonary effort is normal.     Breath sounds: Rhonchi (diffuse) present.  Abdominal:  General: Abdomen is flat.     Palpations: Abdomen is soft.     Tenderness: There is no abdominal tenderness.  Musculoskeletal:        General: Normal range of motion.     Cervical back: Normal range of motion.     Left lower leg: No edema.     Comments: RLE BKA  Skin:    General: Skin is warm and dry.  Neurological:     General: No focal deficit present.     Mental Status: He is alert and oriented to person, place, and time.  Psychiatric:        Mood and Affect: Mood normal.        Behavior: Behavior normal.     ED Results / Procedures / Treatments   Labs (all labs ordered are listed, but only abnormal results are displayed) Labs Reviewed  CBC WITH DIFFERENTIAL/PLATELET - Abnormal; Notable for the following components:      Result Value   RBC 3.78 (*)    Hemoglobin 11.5 (*)    HCT 35.6 (*)    Eosinophils Absolute 0.6 (*)    All other components within normal limits  COMPREHENSIVE METABOLIC PANEL WITH GFR - Abnormal; Notable for the following components:   BUN 84 (*)    Creatinine, Ser 2.44 (*)    Albumin  3.2 (*)    GFR, Estimated 26 (*)    All other components within normal limits  MAGNESIUM  - Abnormal; Notable for the following components:   Magnesium  1.6 (*)    All other components within normal limits  CBG MONITORING, ED    EKG EKG Interpretation Date/Time:  Tuesday Apr 09 2024 16:16:03 EDT Ventricular Rate:  60 PR Interval:    QRS Duration:  180 QT  Interval:  485 QTC Calculation: 485 R Axis:   -66  Text Interpretation: Junctional rhythm Left bundle branch block Duplicate EKG Confirmed by Celesta Coke ([id number]) on 04/09/2024 4:51:45 PM  Radiology DG Chest 2 View Result Date: 04/09/2024 CLINICAL DATA:  Syncope. EXAM: CHEST - 2 VIEW COMPARISON:  December 07, 2023 FINDINGS: Multiple sternal wires and vascular clips are seen. The cardiac silhouette is enlarged and unchanged in size. An artificial aortic valve is present. Low lung volumes are noted with mild prominence of the pulmonary vasculature. There is no evidence of focal consolidation or pneumothorax. A small left pleural effusion is suspected. Postoperative changes are seen throughout the cervical spine with multilevel degenerative changes noted throughout the thoracic spine. IMPRESSION: 1. Evidence of prior median sternotomy/CABG. 2. Low lung volumes with mild pulmonary vascular congestion. 3. Small left pleural effusion. Electronically Signed   By: Virgle Grime M.D.   On: 04/09/2024 16:18    Procedures Procedures    Medications Ordered in ED Medications  magnesium  sulfate IVPB 2 g 50 mL (2 g Intravenous New Bag/Given 04/09/24 1744)  sodium chloride  0.9 % bolus 500 mL (0 mLs Intravenous Stopped 04/09/24 1645)  sodium chloride  0.9 % bolus 500 mL (500 mLs Intravenous New Bag/Given 04/09/24 1710)    ED Course/ Medical Decision Making/ A&P Clinical Course as of 04/09/24 1930  Tue Apr 09, 2024  1649 BP remaining soft after fluids, will repeat bolus. Does have small pleural effusion on CXR and will continue with gentle fluids. [VK]  1649 Hgb at baseline/slightly increased, may be mild hemoconcentration. [VK]  1725 AKI with Cr 2.44 from baseline 1.4. Mag 1.6 and will be repleted. Patient will be recommended admission for treatment of AKI in the  setting of hypotension/syncope. [VK]  [id number] I spoke with Dr. Lydia Sams with hospitalist who requested adding on troponin and lactic to evaluate for  cardiac etiology of hypotension. She will evaluate for admission. Requests cardiology consult. Also request CTH. [VK]  1857 I spoke with Dr. Maximo Spar with cardiology, will send the pacemaker interrogation report once we've received it and he will see the patient. Suspects dehydration/vasovagal in the setting of the AKI. [VK]  1929 Interrogation report without any events. Pacemaker functioning appropriately.  [VK]    Clinical Course User Index [VK] Kingsley, Desmund Elman K, DO                                 Medical Decision Making This patient presents to the ED with chief complaint(s) of syncope with pertinent past medical history of CAD s/p CABG, SSS s/p pacemaker, A fib not on AC, CHF, CKD, DM, ILD which further complicates the presenting complaint. The complaint involves an extensive differential diagnosis and also carries with it a high risk of complications and morbidity.    The differential diagnosis includes ACS, arrhythmia, anemia, dehydration, electrolyte abnormality, orthostatic hypotension, medication side effect  Additional history obtained: Additional history obtained from family Records reviewed Care Everywhere/External Records  ED Course and Reassessment: On patient's arrival, blood pressure soft in the 90s, otherwise hemodynamically stable in no acute distress and at his neurologic baseline.  Patient will have workup including EKG, labs, orthostatics and device interrogation.  Will be given gentle fluids and will be closely reassessed.  Independent labs interpretation:  The following labs were independently interpreted: AKI on CKD, mild hypomag  Independent visualization of imaging: - I independently visualized the following imaging with scope of interpretation limited to determining acute life threatening conditions related to emergency care: CXR, which revealed small pleural effusion  Consultation: - Consulted or discussed management/test interpretation w/ external  professional: hospitalist  Consideration for admission or further workup: patient requires admission for AKI and syncope Social Determinants of health: N/A    Amount and/or Complexity of Data Reviewed Labs: ordered. Radiology: ordered.  Risk Prescription drug management. Decision regarding hospitalization.          Final Clinical Impression(s) / ED Diagnoses Final diagnoses:  AKI (acute kidney injury) (HCC)  Syncope, unspecified syncope type    Rx / DC Orders ED Discharge Orders     None         Kingsley, Tericka Devincenzi K, DO 04/09/24 1811

## 2024-04-09 NOTE — Consult Note (Signed)
 CONSULTATION NOTE   Patient Name: Shane [name] Date of Encounter: 04/09/2024 Cardiologist: Alexandria Angel, MD Electrophysiologist: Manya Sells, MD Advanced Heart Failure: None   Chief Complaint   Fatigue, weakness, syncope  Patient Profile   83 yo male with 1 to 2 days of fatigue, weakness and syncope/near syncope today  HPI   Shane [name] is a 83 y.o. male who is being seen today for the evaluation of weakness, fatigue and near syncope at the request of Dr. Nora Beal.  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  He is followed closely at [facility].  He was seen most recently in March 2025 in follow-up of a recent hospitalization in [place] with COVID-pneumonia in January leading to hypoxic respiratory failure and CRRT.  He was reportedly very slow to recover from this.  Creatinine at that time was 1.4 with a hemoglobin of 10.9.  Echo showed LVEF 40% with normal trans catheter gradient in January 2025.  He now presents to [facility] emergency department with progressive fatigue, weakness and near syncope/syncope.  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  He started to have worsening dizziness and walked about 2 blocks when he then passed out.  He did have a feeling that he may pass out including tunneling of his vision and some nauseous nests but no vomiting.  He has denied any chest pain or shortness of breath.  He was brought in by EMS and noted to be hypotensive and given IV fluids with some improvement of the symptoms.  Initial blood pressure was 95/54.  Labs show  elevated creatinine at 2.44 (up from baseline 1.4) and magnesium  1.6 with hemoglobin hematocrit of 11.5 and 35.6.  Chest x-ray suggested mild pulmonary vascular congestion with left lower lobe effusion. Lactic acid was 1.1. EKG showed wide complex rhythm, likely ventricular paced.  Pacemaker interrogation was performed and demonstrated normal function.  PMHx   Past Medical History:  Diagnosis Date   Acute blood loss anemia    Anxiety    AV block, Mobitz 1    Cataract    Chronic kidney disease    d/t DM   CKD (chronic kidney disease), stage III (HCC)    COVID-19 virus infection 07/18/2020   Last Assessment & Plan:   Formatting of this note might be different from the original.  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 10/2019 Echo: EF 55-60%, Gr2 DD. Nl RV.    Neuromuscular disorder (HCC)    Neuropathy associated with endocrine disorder (HCC)  Nonobstructive CAD (coronary artery disease)    a. 2012 Cath: mod, nonobs dzs; b. 10/2016 MV: EF 60%, no ischemia.   OSA on CPAP 05/05/2017   Unattended Home Sleep Test 7/2/813-AHI 38.6/hour, desaturation to 64%, body weight 261 pounds   PONV (postoperative nausea and vomiting)    Postoperative anemia due to acute blood loss 11/07/2020   Postoperative hemorrhagic shock 03/20/2021   Sleep apnea     uses cpap asked to bring mask and tubing    Past Surgical History:  Procedure Laterality Date   ABDOMINAL AORTOGRAM W/LOWER EXTREMITY N/A 12/10/2020   Procedure: ABDOMINAL AORTOGRAM W/LOWER  EXTREMITY;  Surgeon: Carlene Che, MD;  [facility]: [facility] INVASIVE CV LAB;  Service: Cardiovascular;  Laterality: N/A;   AMPUTATION Right 01/22/2021   Procedure: 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;  Surgeon: Timothy Ford, MD;  Location: [facility] OR;  Service: Orthopedics;  Laterality: Right;   AMPUTATION Right 03/17/2021   Procedure: RIGHT BELOW KNEE AMPUTATION;  Surgeon: Timothy Ford, MD;  Location: [facility] OR;  Service: Orthopedics;  Laterality: Right;   ANKLE FUSION Right 01/22/2021   Procedure: RIGHT FOOT TIBIOCALCANEAL FUSION;  Surgeon: Timothy Ford, MD;  Location: [facility] OR;  Service: Orthopedics;  Laterality: Right;   ANTERIOR FUSION CERVICAL SPINE  2012   CARDIAC CATHETERIZATION  2011   CARDIAC CATHETERIZATION N/A 11/09/2016   Procedure: Right Heart Cath;  Surgeon: Arty Binning, MD;  Location: [facility] INVASIVE CV LAB;  Service: Cardiovascular;  Laterality: N/A;   carpel tunnel     left wrist   CATARACT EXTRACTION     CATARACT EXTRACTION W/ INTRAOCULAR LENS  IMPLANT, BILATERAL  2013   CERVICAL LAMINECTOMY  2012   COLONOSCOPY N/A 01/14/2013   Procedure: COLONOSCOPY;  Surgeon: Tobin Forts, MD;  Location: [facility] ENDOSCOPY;  Service: Endoscopy;  Laterality: N/A;   CORONARY ARTERY BYPASS GRAFT  11/04/2020   LIMA-LAD, SVG-OM1, SVG-PDA (Dr Silver [facility]) dc 11/18/2020   EYE SURGERY     I & D EXTREMITY Right 02/19/2021   Procedure: RIGHT ANKLE DEBRIDEMENT AND PLACEMENT ANTIBIOTIC BEADS;  Surgeon: Timothy Ford, MD;  Location: [facility] OR;  Service: Orthopedics;  Laterality: Right;   KNEE SURGERY  1998   left   LEFT HEART CATH AND CORONARY ANGIOGRAPHY N/A 07/10/2020   Procedure: LEFT HEART CATH AND CORONARY ANGIOGRAPHY;  Surgeon: Arnoldo Lapping, MD;  Location: [facility] INVASIVE CV LAB;  Service: Cardiovascular;  Laterality: N/A;   LUMBAR LAMINECTOMY  2003   LUNG BIOPSY Left 12/26/2016   Procedure: LUNG BIOPSY;  Surgeon: Zelphia Higashi, MD;  Location: [facility] OR;  Service: Thoracic;  Laterality: Left;   PACEMAKER IMPLANT N/A  03/30/2020   Procedure: PACEMAKER IMPLANT;  Surgeon: Tammie Fall, MD;  Location: [facility] INVASIVE CV LAB;  Service: Cardiovascular;  Laterality: N/A;   PERIPHERAL VASCULAR INTERVENTION Right 12/10/2020   Procedure: PERIPHERAL VASCULAR INTERVENTION;  Surgeon: Carlene Che, MD;  Location: [facility];  Service: Cardiovascular;  Laterality: Right;  SFA   POSTERIOR FUSION CERVICAL SPINE  2012   PPM GENERATOR REMOVAL N/A 12/14/2020   Procedure: PPM GENERATOR REMOVAL;  Surgeon: Tammie Fall, MD;  Location: [facility]VAS[facility] LAB;  Service: Cardiovascular;  Laterality: N/A;   TEE WITHOUT CARDIOVERSION N/A 12/11/2020   Procedure: TRANSESOPHAGEAL ECHOCARDIOGRAM (TEE);  Surgeon: Harrold Lincoln, MD;  Location: [facility] ENDOSCOPY;  Service: Cardiovascular;  Laterality: N/A;   TRIGGER FINGER RELEASE  2011   4th finger left hand   VIDEO ASSISTED THORACOSCOPY  Left 12/26/2016   Procedure: VIDEO ASSISTED THORACOSCOPY;  Surgeon: Zelphia Higashi, MD;  Location: [facility] OR;  Service: Thoracic;  Laterality: Left;   VIDEO BRONCHOSCOPY N/A 12/26/2016   Procedure: VIDEO BRONCHOSCOPY;  Surgeon: Zelphia Higashi, MD;  Location: [facility] OR;  Service: Thoracic;  Laterality: N/A;    FAMHx   Family History  Problem Relation Age of Onset   Diabetes Mellitus II Mother    Emphysema Father 38   Heart attack Father    Colon cancer Neg Hx    Esophageal cancer Neg Hx    Rectal cancer Neg Hx    Stomach cancer Neg Hx     SOCHx    reports that he has never smoked. He has never used smokeless tobacco. He reports that he does not drink alcohol and does not use drugs.  Outpatient Medications   No current facility-administered medications on file prior to encounter.   Current Outpatient Medications on File Prior to Encounter  Medication Sig Dispense Refill   Acetaminophen  (TYLENOL  PO) Take 2 tablets by mouth as needed. May take 2 tablet by mouth 3 hours after taking the trazodone  75mg  prn.     albuterol  (PROVENTIL )  (2.5 MG/3ML) 0.083% nebulizer solution Take 2.5 mg by nebulization 2 (two) times daily as needed for wheezing or shortness of breath.     albuterol  (VENTOLIN  HFA) 108 (90 Base) MCG/ACT inhaler Inhale 2 puffs into the lungs every 4 (four) hours as needed for wheezing or shortness of breath. (Patient taking differently: Inhale 3 puffs into the lungs every 4 (four) hours as needed for wheezing or shortness of breath.) 6.7 g 0   Ascorbic Acid  (VITAMIN C  WITH ROSE HIPS) 500 MG tablet Take 1 tablet (500 mg total) by mouth daily. (Patient taking differently: Take 500 mg by mouth in the morning.) 30 tablet 0   atorvastatin  (LIPITOR ) 80 MG tablet TAKE 1 TABLET BY MOUTH EVERY DAY (Patient taking differently: Take 80 mg by mouth at bedtime.) 90 tablet 3   benzonatate  (TESSALON ) 100 MG capsule Take 1 capsule (100 mg total) by mouth 3 (three) times daily. (Patient taking differently: Take 100 mg by mouth as needed for cough.) 20 capsule 0   cyanocobalamin  1000 MCG tablet Take 1 tablet (1,000 mcg total) by mouth daily. (Patient taking differently: Take 1,000 mcg by mouth in the morning.) 30 tablet 3   dextromethorphan  (DELSYM ) 30 MG/5ML liquid Take 2.5 mLs (15 mg total) by mouth 2 (two) times daily. (Patient taking differently: Take 15 mg by mouth at bedtime.) 89 mL 0   docusate sodium  (COLACE) 100 MG capsule Take 1 capsule (100 mg total) by mouth 2 (two) times daily. (Patient taking differently: Take 100 mg by mouth as needed for mild constipation or moderate constipation.) 10 capsule 0   escitalopram  (LEXAPRO ) 10 MG tablet Take 1 tablet (10 mg total) by mouth daily. (Patient taking differently: Take 10 mg by mouth in the morning.) 30 tablet 0   furosemide  (LASIX ) 20 MG tablet Take 20 mg by mouth daily at 12 noon.     furosemide  (LASIX ) 40 MG tablet Take 1 tablet (40 mg total) by mouth daily. (Patient taking differently: Take 40 mg by mouth 2 (two) times daily.) 30 tablet 0   gabapentin  (NEURONTIN ) 100 MG capsule Take  1 capsule (100 mg total) by mouth 3 (three) times daily. 90 capsule 0   HUMALOG  KWIKPEN 100 UNIT/ML KwikPen Inject 10-15 Units into the skin 3 (three) times daily. Depending what his blood  sugar reading is     HYDROcodone  bit-homatropine (HYCODAN) 5-1.5 MG/5ML syrup Take 5 mLs by mouth every 6 (six) hours as needed for cough. 120 mL 0   HYDROcodone -acetaminophen  (NORCO/VICODIN) 5-325 MG tablet Take 1 tablet by mouth at bedtime as needed.     lisinopril  (ZESTRIL ) 10 MG tablet Take 10 mg by mouth in the morning.     LORazepam  (ATIVAN ) 1 MG tablet Take 1 tablet (1 mg total) by mouth at bedtime as needed for sleep. 3 tablet 0   methocarbamol  (ROBAXIN ) 500 MG tablet Take 1 tablet (500 mg total) by mouth every 8 (eight) hours as needed for muscle spasms. 30 tablet 0   metoprolol  succinate (TOPROL -XL) 25 MG 24 hr tablet Take 1 tablet (25 mg total) by mouth daily. (Patient taking differently: Take 25 mg by mouth in the morning.) 30 tablet 0   Multiple Vitamin (MULTIVITAMIN WITH MINERALS) TABS tablet Take 1 tablet by mouth in the morning.     mupirocin  ointment (BACTROBAN ) 2 % Apply 1 Application topically at bedtime.     Nutritional Supplements (FEEDING SUPPLEMENT, NEPRO CARB STEADY,) LIQD Take 237 mLs by mouth 2 (two) times daily between meals.     Olopatadine HCl (PATADAY OP) Place 2 drops into both eyes daily as needed (allergies).     ondansetron  (ZOFRAN ) 4 MG tablet Take 4 mg by mouth as needed for nausea or vomiting.     OXYGEN Inhale 3 L into the lungs See admin instructions. Inhale 3L of Oxygen everynight at bedside. May uses throughout the day as needed.     pantoprazole  (PROTONIX ) 40 MG tablet Take 1 tablet (40 mg total) by mouth 2 (two) times daily. 60 tablet 0   polyethylene glycol powder (GLYCOLAX /MIRALAX ) 17 GM/SCOOP powder Take 17 g by mouth as needed for mild constipation or moderate constipation.     traZODone  (DESYREL ) 50 MG tablet Take 75 mg by mouth at bedtime as needed for sleep.      TRESIBA  FLEXTOUCH 100 UNIT/ML FlexTouch Pen Inject 45 Units into the skin daily. (Patient taking differently: Inject 20 Units into the skin in the morning.)     zinc  sulfate 220 (50 Zn) MG capsule Take 1 capsule (220 mg total) by mouth daily. (Patient taking differently: Take 220 mg by mouth in the morning.) 30 capsule 0    Inpatient Medications    Scheduled Meds:   Continuous Infusions:   PRN Meds:    ALLERGIES   Allergies  Allergen Reactions   Codeine Hives and Itching   Ofev  [Nintedanib ] Diarrhea    SEVERE DIARRHEA   Pirfenidone  Diarrhea and Other (See Comments)    Esbriet  (Pirfenidone ) causes elevated LFTs. D/C on 06/14/17 and SEVERE DIARRHEA     ROS   Pertinent items noted in HPI and remainder of comprehensive ROS otherwise negative.  Vitals   Vitals:   04/09/24 1630 04/09/24 1645 04/09/24 1715 04/09/24 1845  BP: (!) 86/46 (!) 94/53 (!) 90/52 104/60  Pulse: 71 61 60 (!) 59  Resp: (!) 23 18 14 15   Temp:   97.9 F (36.6 C)   TempSrc:   Oral   SpO2: 100% 98% 100% 99%   No intake or output data in the 24 hours ending 04/09/24 2020 There were no vitals filed for this visit.  Physical Exam   General appearance: alert and no distress Neck: JVD - 5 cm above sternal notch, no carotid bruit, and thyroid not enlarged, symmetric, no tenderness/mass/nodules Lungs: diminished breath sounds bibasilar Heart:  regular rate and rhythm Abdomen: soft, non-tender; bowel sounds normal; no masses,  no organomegaly Extremities: Right BKA and left TMA Pulses: Decreased distal pulses Skin: Warm, dry Neurologic: Grossly normal Psych: Pleasant  Labs   Results for orders placed or performed during the hospital encounter of 04/09/24 (from the past 48 hours)  CBC WITH DIFFERENTIAL     Status: Abnormal   Collection Time: 04/09/24  3:28 PM  Result Value Ref Range   WBC 6.2 4.0 - 10.5 K/uL   RBC 3.78 (L) 4.22 - 5.81 MIL/uL   Hemoglobin 11.5 (L) 13.0 - 17.0 g/dL   HCT 69.6 (L)  29.5 - 52.0 %   MCV 94.2 80.0 - 100.0 fL   MCH 30.4 26.0 - 34.0 pg   MCHC 32.3 30.0 - 36.0 g/dL   RDW 28.4 13.2 - 44.0 %   Platelets 161 150 - 400 K/uL   nRBC 0.0 0.0 - 0.2 %   Neutrophils Relative % 64 %   Neutro Abs 4.0 1.7 - 7.7 K/uL   Lymphocytes Relative 11 %   Lymphs Abs 0.7 0.7 - 4.0 K/uL   Monocytes Relative 15 %   Monocytes Absolute 0.9 0.1 - 1.0 K/uL   Eosinophils Relative 9 %   Eosinophils Absolute 0.6 (H) 0.0 - 0.5 K/uL   Basophils Relative 1 %   Basophils Absolute 0.0 0.0 - 0.1 K/uL   Immature Granulocytes 0 %   Abs Immature Granulocytes 0.02 0.00 - 0.07 K/uL    Comment: Performed at [facility], [address], [place], [place] [address]  Comprehensive metabolic panel     Status: Abnormal   Collection Time: 04/09/24  3:28 PM  Result Value Ref Range   Sodium 136 135 - 145 mmol/L   Potassium 4.6 3.5 - 5.1 mmol/L   Chloride 99 98 - 111 mmol/L   CO2 22 22 - 32 mmol/L   Glucose, Bld 86 70 - 99 mg/dL    Comment: Glucose reference range applies only to samples taken after fasting for at least 8 hours.   BUN 84 (H) 8 - 23 mg/dL   Creatinine, Ser 5.36 (H) 0.61 - 1.24 mg/dL   Calcium  9.0 8.9 - 10.3 mg/dL   Total Protein 7.2 6.5 - 8.1 g/dL   Albumin  3.2 (L) 3.5 - 5.0 g/dL   AST 19 15 - 41 U/L   ALT 16 0 - 44 U/L   Alkaline Phosphatase 91 38 - 126 U/L   Total Bilirubin 0.8 0.0 - 1.2 mg/dL   GFR, Estimated 26 (L) >60 mL/min    Comment: (NOTE) Calculated using the CKD-EPI Creatinine Equation (2021)    Anion gap 15 5 - 15    Comment: Performed at [facility], [address], [place], [place] [address]  Magnesium      Status: Abnormal   Collection Time: 04/09/24  3:28 PM  Result Value Ref Range   Magnesium  1.6 (L) 1.7 - 2.4 mg/dL    Comment: Performed at [facility], [address], [place], [place] [address]  CBG monitoring, ED     Status: None   Collection Time: 04/09/24  3:31 PM  Result Value Ref Range   Glucose-Capillary 92 70 - 99 mg/dL     Comment: Glucose reference range applies only to samples taken after fasting for at least 8 hours.   Comment 1 Notify RN    Comment 2 Document in Chart   CBG monitoring, ED  Status: None   Collection Time: 04/09/24  6:45 PM  Result Value Ref Range   Glucose-Capillary 91 70 - 99 mg/dL    Comment: Glucose reference range applies only to samples taken after fasting for at least 8 hours.   Comment 1 Notify RN    Comment 2 Document in Chart   I-Stat Lactic Acid     Status: None   Collection Time: 04/09/24  7:16 PM  Result Value Ref Range   Lactic Acid, Venous 1.1 0.5 - 1.9 mmol/L   *Note: Due to a large number of results and/or encounters for the requested time period, some results have not been displayed. A complete set of results can be found in Results Review.    ECG   Ventricular paced rhythm- Personally Reviewed  Telemetry   Paced rhythm- Personally Reviewed  Radiology   DG Chest 2 View Result Date: 04/09/2024 CLINICAL DATA:  Syncope. EXAM: CHEST - 2 VIEW COMPARISON:  December 07, 2023 FINDINGS: Multiple sternal wires and vascular clips are seen. The cardiac silhouette is enlarged and unchanged in size. An artificial aortic valve is present. Low lung volumes are noted with mild prominence of the pulmonary vasculature. There is no evidence of focal consolidation or pneumothorax. A small left pleural effusion is suspected. Postoperative changes are seen throughout the cervical spine with multilevel degenerative changes noted throughout the thoracic spine. IMPRESSION: 1. Evidence of prior median sternotomy/CABG. 2. Low lung volumes with mild pulmonary vascular congestion. 3. Small left pleural effusion. Electronically Signed   By: Virgle Grime M.D.   On: 04/09/2024 16:18    Cardiac Studies   N/A  Impression   Principal Problem:   Syncope and collapse Active Problems:   AKI (acute kidney injury) [facility])   Recommendation   Mr. Ulland presents with syncope and collapse after  feeling unwell all morning.  He was initially found to be hypotensive and creatinine is significantly elevated.  He had recent acute kidney injury in the setting of COVID in January requiring CRRT.  He also has a nonhealing wound around the left TMA.  Lactate is 1.1.  Troponins and BNP are pending.  He appears to have some element of diastolic heart failure.  Would recommend updated echo.  Will follow-up on elevated troponins however I think his syncope/presyncope is related to hypotension and likely dehydration with acute kidney injury.  The etiology of this is unclear but might be infection.  His last echo showed LVEF 40% with normal bioprosthetic gradient.  Thanks for the consultation.  Cardiology will follow with you.  Time Spent Directly with Patient:  I have spent a total of 65 minutes with the patient reviewing hospital notes, telemetry, EKGs, labs and examining the patient as well as establishing an assessment and plan that was discussed personally with the patient.  > 50% of time was spent in direct patient care.  Length of Stay:  LOS: 0 days   Hazle Lites, MD, [facility], FNLA, FACP  [place]  [facility]  Medical Director of the Advanced Lipid Disorders &  Cardiovascular Risk Reduction Clinic Diplomate of the American Board of Clinical Lipidology Attending Cardiologist  Direct Dial: [phone]  Fax: [phone]  Website:  [email or web address]   Aviva Lemmings Ruthetta Koopmann 04/09/2024, 8:20 PM

## 2024-04-09 NOTE — Assessment & Plan Note (Addendum)
 History of endocarditis will follow patient's blood cultures and 2D echo.

## 2024-04-09 NOTE — Assessment & Plan Note (Addendum)
 No anginal symptoms of chest pain chest pressure baseline shortness of breath no worsening per patient. Troponin is elevated 24, BNP is 566.3.  Appreciate cardiology consult and management. Currently patient is on aspirin  and statin therapy we will currently have to hold patient's beta-blocker and lisinopril  therapy because of AKI on CKD and hypotension and syncopal episode.

## 2024-04-09 NOTE — Assessment & Plan Note (Addendum)
 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 Patient is not a good candidate for antiplatelet or anticoagulation therapy.

## 2024-04-10 ENCOUNTER — Inpatient Hospital Stay (HOSPITAL_COMMUNITY)

## 2024-04-10 DIAGNOSIS — R55 Syncope and collapse: Secondary | ICD-10-CM

## 2024-04-10 LAB — COMPREHENSIVE METABOLIC PANEL WITH GFR
ALT: 14 U/L (ref 0–44)
AST: 17 U/L (ref 15–41)
Albumin: 2.9 g/dL — ABNORMAL LOW (ref 3.5–5.0)
Alkaline Phosphatase: 83 U/L (ref 38–126)
Anion gap: 10 (ref 5–15)
BUN: 85 mg/dL — ABNORMAL HIGH (ref 8–23)
CO2: 25 mmol/L (ref 22–32)
Calcium: 8.6 mg/dL — ABNORMAL LOW (ref 8.9–10.3)
Chloride: 99 mmol/L (ref 98–111)
Creatinine, Ser: 2.66 mg/dL — ABNORMAL HIGH (ref 0.61–1.24)
GFR, Estimated: 23 mL/min — ABNORMAL LOW (ref >60)
Glucose, Bld: 135 mg/dL — ABNORMAL HIGH (ref 70–99)
Potassium: 5 mmol/L (ref 3.5–5.1)
Sodium: 134 mmol/L — ABNORMAL LOW (ref 135–145)
Total Bilirubin: 0.6 mg/dL (ref 0.0–1.2)
Total Protein: 6.4 g/dL — ABNORMAL LOW (ref 6.5–8.1)

## 2024-04-10 LAB — CBC
HCT: 31.4 % — ABNORMAL LOW (ref 39.0–52.0)
Hemoglobin: 10.4 g/dL — ABNORMAL LOW (ref 13.0–17.0)
MCH: 30.1 pg (ref 26.0–34.0)
MCHC: 33.1 g/dL (ref 30.0–36.0)
MCV: 90.8 fL (ref 80.0–100.0)
Platelets: 134 10*3/uL — ABNORMAL LOW (ref 150–400)
RBC: 3.46 MIL/uL — ABNORMAL LOW (ref 4.22–5.81)
RDW: 13.7 % (ref 11.5–15.5)
WBC: 5.1 10*3/uL (ref 4.0–10.5)
nRBC: 0 % (ref 0.0–0.2)

## 2024-04-10 LAB — HEMOGLOBIN AND HEMATOCRIT, BLOOD
HCT: 34.7 % — ABNORMAL LOW (ref 39.0–52.0)
Hemoglobin: 11.2 g/dL — ABNORMAL LOW (ref 13.0–17.0)

## 2024-04-10 LAB — URINALYSIS, ROUTINE W REFLEX MICROSCOPIC
Bilirubin Urine: NEGATIVE
Glucose, UA: NEGATIVE mg/dL
Hgb urine dipstick: NEGATIVE
Ketones, ur: NEGATIVE mg/dL
Leukocytes,Ua: NEGATIVE
Nitrite: NEGATIVE
Protein, ur: NEGATIVE mg/dL
Specific Gravity, Urine: 1.025 (ref 1.005–1.030)
pH: 5.5 (ref 5.0–8.0)

## 2024-04-10 LAB — CREATININE, URINE, RANDOM: Creatinine, Urine: 102 mg/dL

## 2024-04-10 LAB — GLUCOSE, CAPILLARY
Glucose-Capillary: 137 mg/dL — ABNORMAL HIGH (ref 70–99)
Glucose-Capillary: 145 mg/dL — ABNORMAL HIGH (ref 70–99)
Glucose-Capillary: 159 mg/dL — ABNORMAL HIGH (ref 70–99)
Glucose-Capillary: 104 mg/dL — ABNORMAL HIGH (ref 70–99)

## 2024-04-10 LAB — CK: Total CK: 71 U/L (ref 49–397)

## 2024-04-10 LAB — CORTISOL: Cortisol, Plasma: 8.5 ug/dL

## 2024-04-10 LAB — HEMOGLOBIN A1C
Hgb A1c MFr Bld: 8.7 % — ABNORMAL HIGH (ref 4.8–5.6)
Mean Plasma Glucose: 202.99 mg/dL

## 2024-04-10 LAB — CBG MONITORING, ED: Glucose-Capillary: 103 mg/dL — ABNORMAL HIGH (ref 70–99)

## 2024-04-10 LAB — SODIUM, URINE, RANDOM: Sodium, Ur: 40 mmol/L

## 2024-04-10 LAB — TROPONIN I (HIGH SENSITIVITY): Troponin I (High Sensitivity): 23 ng/L — ABNORMAL HIGH (ref <18)

## 2024-04-10 MED ORDER — SODIUM CHLORIDE 0.9 % IV SOLN: INTRAVENOUS | Status: AC

## 2024-04-10 MED ORDER — ENOXAPARIN SODIUM 40 MG/0.4ML IJ SOSY
40.0000 mg | PREFILLED_SYRINGE | INTRAMUSCULAR | Status: DC
  Administered 2024-04-10: 40 mg via SUBCUTANEOUS
  Filled 2024-04-10: qty 0.4

## 2024-04-10 MED ORDER — BENZONATATE 100 MG PO CAPS
100.0000 mg | ORAL_CAPSULE | Freq: Two times a day (BID) | ORAL | Status: DC
  Administered 2024-04-10 – 2024-04-12 (×4): 100 mg via ORAL
  Filled 2024-04-10 (×4): qty 1

## 2024-04-10 MED ORDER — ENOXAPARIN SODIUM 120 MG/0.8ML IJ SOSY
110.0000 mg | PREFILLED_SYRINGE | INTRAMUSCULAR | Status: DC
  Administered 2024-04-10: 110 mg via SUBCUTANEOUS
  Filled 2024-04-10 (×2): qty 0.74

## 2024-04-10 MED ORDER — TECHNETIUM TO 99M ALBUMIN AGGREGATED
4.4000 | Freq: Once | INTRAVENOUS | Status: AC | PRN
  Administered 2024-04-10: 4.4 via INTRAVENOUS

## 2024-04-10 MED ORDER — MEDIHONEY WOUND/BURN DRESSING EX PSTE
1.0000 | PASTE | Freq: Every day | CUTANEOUS | Status: DC
  Administered 2024-04-10 – 2024-04-14 (×5): 1 via TOPICAL
  Filled 2024-04-10: qty 44

## 2024-04-10 MED ORDER — MIDODRINE HCL 5 MG PO TABS
5.0000 mg | ORAL_TABLET | Freq: Two times a day (BID) | ORAL | Status: DC
  Administered 2024-04-10 – 2024-04-11 (×3): 5 mg via ORAL
  Filled 2024-04-10 (×3): qty 1

## 2024-04-10 MED ORDER — GUAIFENESIN-DM 100-10 MG/5ML PO SYRP
5.0000 mL | ORAL_SOLUTION | Freq: Four times a day (QID) | ORAL | Status: DC | PRN
  Administered 2024-04-11 – 2024-04-13 (×9): 5 mL via ORAL
  Filled 2024-04-10 (×9): qty 5

## 2024-04-10 MED ORDER — GUAIFENESIN-DM 100-10 MG/5ML PO SYRP: 5.0000 mL | ORAL_SOLUTION | ORAL | Status: DC | PRN

## 2024-04-10 NOTE — Progress Notes (Addendum)
 Patient complaining of chest pains 2-3/10. EKG done Rathore MD paged. No new orders at this time.

## 2024-04-10 NOTE — Plan of Care (Signed)
   Problem: Coping: Goal: Level of anxiety will decrease Outcome: Progressing

## 2024-04-10 NOTE — Progress Notes (Signed)
 1801-RN notified of +PE on perfusion scan.  1803-RN notified Dr. Drexel Gentles. Provider is aware.

## 2024-04-10 NOTE — Plan of Care (Signed)
  Problem: Coping: Goal: Ability to adjust to condition or change in health will improve Outcome: Progressing   Problem: Nutritional: Goal: Maintenance of adequate nutrition will improve Outcome: Progressing   Problem: Skin Integrity: Goal: Risk for impaired skin integrity will decrease Outcome: Progressing   Problem: Cardiac: Goal: Will achieve and/or maintain adequate cardiac output Outcome: Progressing   Problem: Education: Goal: Knowledge of General Education information will improve Description: Including pain rating scale, medication(s)/side effects and non-pharmacologic comfort measures Outcome: Progressing   Problem: Health Behavior/Discharge Planning: Goal: Ability to manage health-related needs will improve Outcome: Progressing   Problem: Clinical Measurements: Goal: Ability to maintain clinical measurements within normal limits will improve Outcome: Progressing   Problem: Activity: Goal: Risk for activity intolerance will decrease Outcome: Progressing   Problem: Nutrition: Goal: Adequate nutrition will be maintained Outcome: Progressing   Problem: Coping: Goal: Level of anxiety will decrease Outcome: Progressing   Problem: Elimination: Goal: Will not experience complications related to bowel motility Outcome: Progressing Goal: Will not experience complications related to urinary retention Outcome: Progressing   Problem: Pain Managment: Goal: General experience of comfort will improve and/or be controlled Outcome: Progressing   Problem: Safety: Goal: Ability to remain free from injury will improve Outcome: Progressing   Problem: Skin Integrity: Goal: Risk for impaired skin integrity will decrease Outcome: Progressing

## 2024-04-10 NOTE — Consult Note (Addendum)
 WOC Nurse Consult Note: Reason for Consult: Consult requested for left foot wounds.  Performed remotely after review of progress notes and photos in the EMR.  According to the bedside nurses' wound care flow sheet: Left anterior foot chronic full thickness wounds is 3X1X1cm, 80% red, 10% yellow, 10% black to wound edges Left foot chronic full thickness wounds is 1.5X6.5X.25cm, 90% red, 10% yellow Dressing procedure/placement/frequency: Topical treatment orders provided for bedside nurses to perform as follows to assist with the removal of nonviable tissue:  Apply Medihoney to 2 foot wounds Q day, then cover with foam dressing.  Change foam dressing Q 3 days or PRN soiling  Please re-consult if further assistance is needed.  Thank-you,  Wiliam Harder MSN, RN, CWOCN, [place], CNS [phone]

## 2024-04-10 NOTE — Progress Notes (Signed)
 Patient is having back to back coughing fits. Patient is ShOB and crackles is heard bilaterally in upper and lower lobes. Rathore Notified.

## 2024-04-10 NOTE — Progress Notes (Signed)
 PHARMACY - ANTICOAGULATION CONSULT NOTE  Pharmacy Consult for enoxaparin  Indication: pulmonary embolus  Allergies  Allergen Reactions   Codeine Hives and Itching   Ofev  [Nintedanib ] Diarrhea    SEVERE DIARRHEA   Pirfenidone  Diarrhea and Other (See Comments)    Esbriet  (Pirfenidone ) causes elevated LFTs. D/C on 06/14/17 and SEVERE DIARRHEA     Patient Measurements: Height: 6' (182.9 cm) Weight: 108.4 kg (239 lb) IBW/kg (Calculated) : 77.6 HEPARIN  DW (KG): 98.8  Vital Signs:    Labs: Recent Labs    04/09/24 1528 04/09/24 1909 04/09/24 2338 04/10/24 0905  HGB 11.5*  --  11.2* 10.4*  HCT 35.6*  --  34.7* 31.4*  PLT 161  --   --  134*  CREATININE 2.44*  --   --  2.66*  CKTOTAL  --   --  71  --   TROPONINIHS  --  24* 23*  --     Estimated Creatinine Clearance: 27.2 mL/min (A) (by C-G formula based on SCr of 2.66 mg/dL (H)).   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 10/2019 Echo: EF 55-60%, Gr2 DD. Nl RV.    Neuromuscular disorder (HCC)    Neuropathy associated with endocrine disorder (HCC)    Nonobstructive CAD (coronary artery disease)    a. 2012 Cath: mod, nonobs dzs;  b. 10/2016 MV: EF 60%, no ischemia.   OSA on CPAP 05/05/2017   Unattended Home Sleep Test 7/2/813-AHI 38.6/hour, desaturation to 64%, body weight 261 pounds   PONV (postoperative nausea and vomiting)    Postoperative anemia due to acute blood loss 11/07/2020   Postoperative hemorrhagic shock 03/20/2021   Sleep apnea     uses cpap asked to bring mask and tubing    Assessment: 60 yoM admitted with syncope, VQ scan high probability PE, pharmacy to dose enoxaparin . No AC PTA, CBC ok, Cr is up from baseline (CrCl ~27 ml/min).  Goal of Therapy:  aPTT 0.6-1 seconds Monitor platelets by anticoagulation protocol: Yes   Plan:  Enoxaparin  1mg /kg q24h Watch Cr, consider LMWH lvl at Css  Levin Reamer, PharmD, BCPS, [facility] Clinical Pharmacist [phone] Please check AMION for all [facility] Pharmacy numbers 04/10/2024

## 2024-04-10 NOTE — Progress Notes (Signed)
 Rounding Note    Patient Name: Shane [name] Date of Encounter: 04/10/2024  [facility] Cardiologist: Alexandria Angel, MD   Subjective   Pt denies feeling weak  No CP   Inpatient Medications    Scheduled Meds:  atorvastatin   80 mg Oral QHS   enoxaparin  (LOVENOX ) injection  40 mg Subcutaneous Q24H   escitalopram   10 mg Oral q AM   insulin  aspart  0-15 Units Subcutaneous TID WC   leptospermum manuka honey  1 Application Topical Daily   midodrine  5 mg Oral BID WC   pantoprazole   40 mg Oral BID   sodium chloride  flush  3 mL Intravenous Q12H   Continuous Infusions:  sodium chloride      PRN Meds: albuterol    Vital Signs    Vitals:   04/10/24 0015 04/10/24 0300 04/10/24 0400 04/10/24 0601  BP: 93/63 (!) 91/54 (!) 104/56 (!) 113/52  Pulse: 61 60 61 62  Resp: 17 18 18 17   Temp:  97.7 F (36.5 C)  98.5 F (36.9 C)  TempSrc:  Oral  Oral  SpO2: 94% 97% 98%   Weight:    108.4 kg  Height:        Intake/Output Summary (Last 24 hours) at 04/10/2024 1320 Last data filed at 04/10/2024 0900 Gross per 24 hour  Intake 340 ml  Output 500 ml  Net -160 ml      04/10/2024    6:01 AM 04/09/2024   11:28 PM 12/30/2023    5:00 AM  Last 3 Weights  Weight (lbs) 239 lb 226 lb 13.7 oz 226 lb 13.7 oz  Weight (kg) 108.41 kg 102.9 kg 102.9 kg      Telemetry    SR - Personally Reviewed  ECG    No new  - Personally Reviewed  Physical Exam   GEN: No acute distress.   Neck: No JVD Cardiac: RRR, no murmurs, rubs, or gallops.  Respiratory: Clear to auscultation bilaterally. GI: Soft, nontender, non-distended  MS: No LE edema  s/p R BKA   S.p L transmet amp  Labs    High Sensitivity Troponin:   Recent Labs  Lab 04/09/24 1909 04/09/24 2338  TROPONINIHS 24* 23*     Chemistry Recent Labs  Lab 04/09/24 1528 04/10/24 0905  NA 136 134*  K 4.6 5.0  CL 99 99  CO2 22 25  GLUCOSE 86 135*  BUN 84* 85*  CREATININE 2.44* 2.66*  CALCIUM  9.0 8.6*  MG 1.6*  --   PROT  7.2 6.4*  ALBUMIN  3.2* 2.9*  AST 19 17  ALT 16 14  ALKPHOS 91 83  BILITOT 0.8 0.6  GFRNONAA 26* 23*  ANIONGAP 15 10    Lipids No results for input(s): "CHOL", "TRIG", "HDL", "LABVLDL", "LDLCALC", "CHOLHDL" in the last 168 hours.  Hematology Recent Labs  Lab 04/09/24 1528 04/09/24 2338 04/10/24 0905  WBC 6.2  --  5.1  RBC 3.78*  --  3.46*  HGB 11.5* 11.2* 10.4*  HCT 35.6* 34.7* 31.4*  MCV 94.2  --  90.8  MCH 30.4  --  30.1  MCHC 32.3  --  33.1  RDW 13.5  --  13.7  PLT 161  --  134*   Thyroid No results for input(s): "TSH", "FREET4" in the last 168 hours.  BNP Recent Labs  Lab 04/09/24 1909  BNP 566.3*    DDimer No results for input(s): "DDIMER" in the last 168 hours.   Radiology    US   RENAL Result Date: 04/09/2024 CLINICAL DATA:  Acute renal injury EXAM: RENAL / URINARY TRACT ULTRASOUND COMPLETE COMPARISON:  12/30/2023 FINDINGS: Right Kidney: Renal measurements: 11.3 x 4.9 x 4.4 cm. = volume: 129 mL. Echogenicity within normal limits. No mass or hydronephrosis visualized. Left Kidney: Renal measurements: 11.0 x 5.1 x 4.1 cm. = volume: 122 mL. Echogenicity within normal limits. No mass or hydronephrosis visualized. Bladder: Appears normal for degree of bladder distention. Other: None. IMPRESSION: No acute abnormality in the kidneys. Electronically Signed   By: Violeta Grey M.D.   On: 04/09/2024 21:39   CT Head Wo Contrast Result Date: 04/09/2024 CLINICAL DATA:  Syncope/presyncope, cerebrovascular cause suspected EXAM: CT HEAD WITHOUT CONTRAST TECHNIQUE: Contiguous axial images were obtained from the base of the skull through the vertex without intravenous contrast. RADIATION DOSE REDUCTION: This exam was performed according to the departmental dose-optimization program which includes automated exposure control, adjustment of the mA and/or kV according to patient size and/or use of iterative reconstruction technique. COMPARISON:  CT head April 24, 21. FINDINGS: Brain: No evidence  of acute infarction, hemorrhage, hydrocephalus, extra-axial collection or mass lesion/mass effect. Vascular: No hyperdense vessel identified. Skull: No acute fracture. Sinuses/Orbits: Clear sinuses.  No acute orbital findings. Other: No mastoid effusions. IMPRESSION: No evidence of acute intracranial abnormality. Electronically Signed   By: Stevenson Elbe M.D.   On: 04/09/2024 20:49   DG Chest 2 View Result Date: 04/09/2024 CLINICAL DATA:  Syncope. EXAM: CHEST - 2 VIEW COMPARISON:  December 07, 2023 FINDINGS: Multiple sternal wires and vascular clips are seen. The cardiac silhouette is enlarged and unchanged in size. An artificial aortic valve is present. Low lung volumes are noted with mild prominence of the pulmonary vasculature. There is no evidence of focal consolidation or pneumothorax. A small left pleural effusion is suspected. Postoperative changes are seen throughout the cervical spine with multilevel degenerative changes noted throughout the thoracic spine. IMPRESSION: 1. Evidence of prior median sternotomy/CABG. 2. Low lung volumes with mild pulmonary vascular congestion. 3. Small left pleural effusion. Electronically Signed   By: Virgle Grime M.D.   On: 04/09/2024 16:18    Cardiac Studies   Echo ordered   Patient Profile     83 y.o. male with 1 to 2 day hx of fatigue, weakness, near syncope   Assessment & Plan    1  Weakness, fatigue   Pt was hypotensive yesterday    Blood work consistent with dehydration   Pt received some IV fluids   BP is better   BUN/Cr are still signficantly elevated from baseline   (85/2.66)  I would give some additional fluid and follow   Looks like already ordered  Check orthostatics later today   2  Trop elevation   Trivial elevation   Flat     3  Hx HFrEF  Last echo LVEF 40%   Echo ordered        For questions or updates, please contact [facility] Please consult [email or web address] for contact info under        Signed, Ola Berger, MD   04/10/2024, 1:20 PM

## 2024-04-10 NOTE — Progress Notes (Addendum)
 PROGRESS NOTE    Shane [name]  ZOX:[id number] DOB: 01/08/1941 DOA: 04/09/2024 PCP: Suan Elm, MD  82/M with CAD, CABG, chronic diastolic CHF, A-fib, sick sinus syndrome with PPM, OSA, pulmonary fibrosis, PAD, right BKA, left TMA with small wounds, TAVR in 9/24 was hospitalized in 1/25 with prolonged respiratory failure related to CHF and COVID-pneumonia, required CRRT. - Brought to [facility] ED 5/6 following syncopal episode while at his rental property, he did notice lightheadedness and dizziness prior to and felt generally weaker yesterday.  EMS noted hypotension, given IV fluids, ED blood pressure was 95/54, creatinine up to 2.4, mag 1.6, hemoglobin 11.5, chest x-ray with mild pulmonary vascular congestion, lactic acid 1.1   Subjective: -Feels fair, generally reports overall decline in his lung capacity over the last few months, denies orthopnea or increased swelling  Assessment and Plan:  Syncope and collapse Hypotension -Pacer interrogation noted normal function -Lisinopril  discontinued, metoprolol  held, started on midodrine yesterday, will continue this today -gentle IVF x6h -Follow-up repeat echo, no fever or leukocytosis, blood cultures negative so far  Acute kidney injury superimposed on stage 3a chronic kidney disease - Hemodynamically mediated from hypotension yesterday, overnight - BP is better but still soft, add IVF X 6 hours - Hold lisinopril  - Check renal ultrasound  Diabetic foot ulcer (HCC) -Anterior wound superficial without any signs or symptoms of infection, left foot wound is deeper but without signs or symptoms of secondary infection, continue routine wound care - Follow-up with Dr. Julio Ohm  Type 2 diabetes mellitus without complication, with long-term current use of insulin  (HCC) -CBGs are stable, Tresiba  on hold, A1c previous 1 was 8.5.  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  Per wife  pt's physician uses doppler device to check his pulses in his feet. - Followed by Dr. Julio Ohm  Adrenal insufficiency [facility]) -History of adrenal insufficiency, random cortisol yesterday was acceptable at 8.5  Coronary artery disease involving native heart without angina pectoris/ S/P CABG x3 Stable, continue statin, holding metoprolol   OSA on CPAP CPAP continue per home settings.  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HCC) At baseline patient is set to have IPF and is to use as needed oxygen during the day and CPAP at night.  DVT prophylaxis: Add Lovenox  Code Status: Discussed CODE STATUS with the patient, DNR Family Communication: Spouse at bedside Disposition Plan: Home tomorrow f stable  Consultants:    Procedures:   Antimicrobials:    Objective: Vitals:   04/10/24 0015 04/10/24 0300 04/10/24 0400 04/10/24 0601  BP: 93/63 (!) 91/54 (!) 104/56 (!) 113/52  Pulse: 61 60 61 62  Resp: 17 18 18 17   Temp:  97.7 F (36.5 C)  98.5 F (36.9 C)  TempSrc:  Oral  Oral  SpO2: 94% 97% 98%   Weight:    108.4 kg  Height:        Intake/Output Summary (Last 24 hours) at 04/10/2024 1250 Last data filed at 04/10/2024 0900 Gross per 24 hour  Intake 340 ml  Output 500 ml  Net -160 ml   Filed Weights   04/09/24 2328 04/10/24 0601  Weight: 102.9 kg 108.4 kg    Examination:  General exam: Appears calm and comfortable  Respiratory system: Few basilar Rales Cardiovascular system: S1 & S2 heard, RRR.  Abd: nondistended, soft and nontender.Normal bowel sounds heard. Central nervous system: Alert and oriented. No focal neurological deficits. Extremities: BKA, transmetatarsal amputation with 2 wounds Skin: As above Psychiatry:  Mood & affect appropriate.     Data Reviewed:   CBC: Recent Labs  Lab 04/09/24 1528 04/09/24 2338 04/10/24 0905  WBC 6.2  --  5.1  NEUTROABS 4.0  --   --   HGB 11.5* 11.2* 10.4*  HCT 35.6* 34.7* 31.4*  MCV 94.2  --  90.8  PLT 161  --  134*   Basic  Metabolic Panel: Recent Labs  Lab 04/09/24 1528 04/10/24 0905  NA 136 134*  K 4.6 5.0  CL 99 99  CO2 22 25  GLUCOSE 86 135*  BUN 84* 85*  CREATININE 2.44* 2.66*  CALCIUM  9.0 8.6*  MG 1.6*  --    GFR: Estimated Creatinine Clearance: 27.2 mL/min (A) (by C-G formula based on SCr of 2.66 mg/dL (H)). Liver Function Tests: Recent Labs  Lab 04/09/24 1528 04/10/24 0905  AST 19 17  ALT 16 14  ALKPHOS 91 83  BILITOT 0.8 0.6  PROT 7.2 6.4*  ALBUMIN  3.2* 2.9*   No results for input(s): "LIPASE", "AMYLASE" in the last 168 hours. No results for input(s): "AMMONIA" in the last 168 hours. Coagulation Profile: No results for input(s): "INR", "PROTIME" in the last 168 hours. Cardiac Enzymes: Recent Labs  Lab 04/09/24 2338  CKTOTAL 71   BNP (last 3 results) No results for input(s): "PROBNP" in the last 8760 hours. HbA1C: Recent Labs    04/09/24 2338  HGBA1C 8.7*   CBG: Recent Labs  Lab 04/09/24 1531 04/09/24 1845 04/10/24 0012 04/10/24 0912 04/10/24 1148  GLUCAP 92 91 103* 137* 145*   Lipid Profile: No results for input(s): "CHOL", "HDL", "LDLCALC", "TRIG", "CHOLHDL", "LDLDIRECT" in the last 72 hours. Thyroid Function Tests: No results for input(s): "TSH", "T4TOTAL", "FREET4", "T3FREE", "THYROIDAB" in the last 72 hours. Anemia Panel: No results for input(s): "VITAMINB12", "FOLATE", "FERRITIN", "TIBC", "IRON ", "RETICCTPCT" in the last 72 hours. Urine analysis:    Component Value Date/Time   COLORURINE YELLOW 04/10/2024 0535   APPEARANCEUR CLEAR 04/10/2024 0535   LABSPEC 1.025 04/10/2024 0535   PHURINE 5.5 04/10/2024 0535   GLUCOSEU NEGATIVE 04/10/2024 0535   HGBUR NEGATIVE 04/10/2024 0535   BILIRUBINUR NEGATIVE 04/10/2024 0535   KETONESUR NEGATIVE 04/10/2024 0535   PROTEINUR NEGATIVE 04/10/2024 0535   UROBILINOGEN 1.0 09/27/2011 1112   NITRITE NEGATIVE 04/10/2024 0535   LEUKOCYTESUR NEGATIVE 04/10/2024 0535   Sepsis  Labs: @LABRCNTIP (procalcitonin:4,lacticidven:4)  ) Recent Results (from the past 240 hours)  Culture, blood (Routine X 2) w Reflex to ID Panel     Status: None (Preliminary result)   Collection Time: 04/09/24  8:49 PM   Specimen: BLOOD  Result Value Ref Range Status   Specimen Description BLOOD BLOOD LEFT HAND  Final   Special Requests   Final    BOTTLES DRAWN AEROBIC ONLY Blood Culture adequate volume   Culture   Final    NO GROWTH < 12 HOURS Performed at [facility], [address], [place], [place] [address]    Report Status PENDING  Incomplete  Culture, blood (Routine X 2) w Reflex to ID Panel     Status: None (Preliminary result)   Collection Time: 04/09/24 11:40 PM   Specimen: BLOOD RIGHT HAND  Result Value Ref Range Status   Specimen Description BLOOD RIGHT HAND  Final   Special Requests   Final    BOTTLES DRAWN AEROBIC AND ANAEROBIC Blood Culture adequate volume   Culture   Final    NO GROWTH < 12 HOURS Performed at [name]  Lab, [address], [place], [place] [address]    Report Status PENDING  Incomplete     Radiology Studies: US  RENAL Result Date: 04/09/2024 CLINICAL DATA:  Acute renal injury EXAM: RENAL / URINARY TRACT ULTRASOUND COMPLETE COMPARISON:  12/30/2023 FINDINGS: Right Kidney: Renal measurements: 11.3 x 4.9 x 4.4 cm. = volume: 129 mL. Echogenicity within normal limits. No mass or hydronephrosis visualized. Left Kidney: Renal measurements: 11.0 x 5.1 x 4.1 cm. = volume: 122 mL. Echogenicity within normal limits. No mass or hydronephrosis visualized. Bladder: Appears normal for degree of bladder distention. Other: None. IMPRESSION: No acute abnormality in the kidneys. Electronically Signed   By: Violeta Grey M.D.   On: 04/09/2024 21:39   CT Head Wo Contrast Result Date: 04/09/2024 CLINICAL DATA:  Syncope/presyncope, cerebrovascular cause suspected EXAM: CT HEAD WITHOUT CONTRAST TECHNIQUE: Contiguous axial images were obtained from the base of the  skull through the vertex without intravenous contrast. RADIATION DOSE REDUCTION: This exam was performed according to the departmental dose-optimization program which includes automated exposure control, adjustment of the mA and/or kV according to patient size and/or use of iterative reconstruction technique. COMPARISON:  CT head April 24, 21. FINDINGS: Brain: No evidence of acute infarction, hemorrhage, hydrocephalus, extra-axial collection or mass lesion/mass effect. Vascular: No hyperdense vessel identified. Skull: No acute fracture. Sinuses/Orbits: Clear sinuses.  No acute orbital findings. Other: No mastoid effusions. IMPRESSION: No evidence of acute intracranial abnormality. Electronically Signed   By: Stevenson Elbe M.D.   On: 04/09/2024 20:49   DG Chest 2 View Result Date: 04/09/2024 CLINICAL DATA:  Syncope. EXAM: CHEST - 2 VIEW COMPARISON:  December 07, 2023 FINDINGS: Multiple sternal wires and vascular clips are seen. The cardiac silhouette is enlarged and unchanged in size. An artificial aortic valve is present. Low lung volumes are noted with mild prominence of the pulmonary vasculature. There is no evidence of focal consolidation or pneumothorax. A small left pleural effusion is suspected. Postoperative changes are seen throughout the cervical spine with multilevel degenerative changes noted throughout the thoracic spine. IMPRESSION: 1. Evidence of prior median sternotomy/CABG. 2. Low lung volumes with mild pulmonary vascular congestion. 3. Small left pleural effusion. Electronically Signed   By: Virgle Grime M.D.   On: 04/09/2024 16:18     Scheduled Meds:  atorvastatin   80 mg Oral QHS   escitalopram   10 mg Oral q AM   insulin  aspart  0-15 Units Subcutaneous TID WC   leptospermum manuka honey  1 Application Topical Daily   midodrine  2.5 mg Oral BID WC   pantoprazole   40 mg Oral BID   sodium chloride  flush  3 mL Intravenous Q12H   Continuous Infusions:   LOS: 1 day    Time  spent:    Deforest Fast, MD [facility]   04/10/2024, 12:50 PM

## 2024-04-11 ENCOUNTER — Ambulatory Visit: Admitting: Orthopedic Surgery

## 2024-04-11 ENCOUNTER — Inpatient Hospital Stay (HOSPITAL_COMMUNITY)

## 2024-04-11 DIAGNOSIS — I5023 Acute on chronic systolic (congestive) heart failure: Secondary | ICD-10-CM | POA: Diagnosis not present

## 2024-04-11 DIAGNOSIS — R55 Syncope and collapse: Secondary | ICD-10-CM | POA: Diagnosis not present

## 2024-04-11 LAB — CBC
HCT: 31.3 % — ABNORMAL LOW (ref 39.0–52.0)
Hemoglobin: 10.5 g/dL — ABNORMAL LOW (ref 13.0–17.0)
MCH: 30.3 pg (ref 26.0–34.0)
MCHC: 33.5 g/dL (ref 30.0–36.0)
MCV: 90.5 fL (ref 80.0–100.0)
Platelets: 135 10*3/uL — ABNORMAL LOW (ref 150–400)
RBC: 3.46 MIL/uL — ABNORMAL LOW (ref 4.22–5.81)
RDW: 13.7 % (ref 11.5–15.5)
WBC: 5.2 10*3/uL (ref 4.0–10.5)
nRBC: 0 % (ref 0.0–0.2)

## 2024-04-11 LAB — COMPREHENSIVE METABOLIC PANEL WITH GFR
ALT: 15 U/L (ref 0–44)
AST: 18 U/L (ref 15–41)
Albumin: 2.9 g/dL — ABNORMAL LOW (ref 3.5–5.0)
Alkaline Phosphatase: 84 U/L (ref 38–126)
Anion gap: 11 (ref 5–15)
BUN: 67 mg/dL — ABNORMAL HIGH (ref 8–23)
CO2: 23 mmol/L (ref 22–32)
Calcium: 8.5 mg/dL — ABNORMAL LOW (ref 8.9–10.3)
Chloride: 100 mmol/L (ref 98–111)
Creatinine, Ser: 1.9 mg/dL — ABNORMAL HIGH (ref 0.61–1.24)
GFR, Estimated: 35 mL/min — ABNORMAL LOW (ref >60)
Glucose, Bld: 90 mg/dL (ref 70–99)
Potassium: 4.5 mmol/L (ref 3.5–5.1)
Sodium: 134 mmol/L — ABNORMAL LOW (ref 135–145)
Total Bilirubin: 0.7 mg/dL (ref 0.0–1.2)
Total Protein: 6.6 g/dL (ref 6.5–8.1)

## 2024-04-11 LAB — GLUCOSE, CAPILLARY
Glucose-Capillary: 102 mg/dL — ABNORMAL HIGH (ref 70–99)
Glucose-Capillary: 112 mg/dL — ABNORMAL HIGH (ref 70–99)
Glucose-Capillary: 138 mg/dL — ABNORMAL HIGH (ref 70–99)
Glucose-Capillary: 149 mg/dL — ABNORMAL HIGH (ref 70–99)
Glucose-Capillary: 106 mg/dL — ABNORMAL HIGH (ref 70–99)

## 2024-04-11 LAB — TROPONIN I (HIGH SENSITIVITY)
Troponin I (High Sensitivity): 27 ng/L — ABNORMAL HIGH (ref <18)
Troponin I (High Sensitivity): 30 ng/L — ABNORMAL HIGH (ref <18)
Troponin I (High Sensitivity): 33 ng/L — ABNORMAL HIGH (ref <18)

## 2024-04-11 LAB — BRAIN NATRIURETIC PEPTIDE: B Natriuretic Peptide: 1178.8 pg/mL — ABNORMAL HIGH (ref 0.0–100.0)

## 2024-04-11 LAB — ECHOCARDIOGRAM COMPLETE
Area-P 1/2: 3.65 cm2
Calc EF: 39.9 %
Height: 72 in
S' Lateral: 4.6 cm
Single Plane A2C EF: 46.2 %
Single Plane A4C EF: 31 %
Weight: 3872 [oz_av]

## 2024-04-11 MED ORDER — TRAZODONE HCL 50 MG PO TABS
75.0000 mg | ORAL_TABLET | Freq: Every evening | ORAL | Status: DC | PRN
  Administered 2024-04-11 – 2024-04-13 (×3): 75 mg via ORAL
  Filled 2024-04-11 (×3): qty 2

## 2024-04-11 MED ORDER — BENZONATATE 100 MG PO CAPS
100.0000 mg | ORAL_CAPSULE | Freq: Once | ORAL | Status: AC
  Administered 2024-04-11: 100 mg via ORAL
  Filled 2024-04-11: qty 1

## 2024-04-11 MED ORDER — FUROSEMIDE 10 MG/ML IJ SOLN
40.0000 mg | Freq: Once | INTRAMUSCULAR | Status: AC
  Administered 2024-04-11: 40 mg via INTRAVENOUS
  Filled 2024-04-11: qty 4

## 2024-04-11 MED ORDER — APIXABAN 2.5 MG PO TABS
2.5000 mg | ORAL_TABLET | Freq: Two times a day (BID) | ORAL | Status: DC
  Administered 2024-04-11 (×2): 2.5 mg via ORAL
  Filled 2024-04-11 (×2): qty 1

## 2024-04-11 MED ORDER — PERFLUTREN LIPID MICROSPHERE
1.0000 mL | INTRAVENOUS | Status: AC | PRN
  Administered 2024-04-11: 2 mL via INTRAVENOUS

## 2024-04-11 NOTE — Plan of Care (Signed)

## 2024-04-11 NOTE — Progress Notes (Signed)
 Rounding Note    Patient Name: Shane [name] Date of Encounter: 04/11/2024  [facility] Cardiologist: Alexandria Angel, MD   Subjective   Pt had some SOB last night   Breathing is ok sitting    No CP     Inpatient Medications    Scheduled Meds:  atorvastatin   80 mg Oral QHS   benzonatate   100 mg Oral BID   enoxaparin  (LOVENOX ) injection  110 mg Subcutaneous Q24H   escitalopram   10 mg Oral q AM   insulin  aspart  0-15 Units Subcutaneous TID WC   leptospermum manuka honey  1 Application Topical Daily   midodrine  5 mg Oral BID WC   pantoprazole   40 mg Oral BID   sodium chloride  flush  3 mL Intravenous Q12H   Continuous Infusions:   PRN Meds: albuterol , guaiFENesin -dextromethorphan    Vital Signs    Vitals:   04/10/24 2059 04/10/24 2320 04/11/24 0420 04/11/24 0740  BP:  103/60 (!) 123/59 126/72  Pulse: 73 65 79 67  Resp: 17 (!) 24 (!) 23 18  Temp:  98.5 F (36.9 C) 98.3 F (36.8 C) 98.4 F (36.9 C)  TempSrc:  Oral Oral Oral  SpO2: 98% 98% 98%   Weight:   109.8 kg   Height:        Intake/Output Summary (Last 24 hours) at 04/11/2024 0746 Last data filed at 04/11/2024 0424 Gross per 24 hour  Intake 480 ml  Output 2005 ml  Net -1525 ml      04/11/2024    4:20 AM 04/10/2024    6:01 AM 04/09/2024   11:28 PM  Last 3 Weights  Weight (lbs) 242 lb 239 lb 226 lb 13.7 oz  Weight (kg) 109.77 kg 108.41 kg 102.9 kg      Telemetry    SR - Personally Reviewed  ECG    No new  - Personally Reviewed  Physical Exam   GEN: No acute distress.   Neck: JVP is increased  Cardiac: RRR, no murmurs  Respiratory: Fine rales bilaterally  GI: Soft, nontender, non-distended  MS: No LE edema  s/p R BKA   S.p L transmet amp  Labs    High Sensitivity Troponin:   Recent Labs  Lab 04/09/24 1909 04/09/24 2338 04/11/24 0015 04/11/24 0220  TROPONINIHS 24* 23* 33* 30*     Chemistry Recent Labs  Lab 04/09/24 1528 04/10/24 0905 04/11/24 0220  NA 136 134* 134*  K  4.6 5.0 4.5  CL 99 99 100  CO2 22 25 23   GLUCOSE 86 135* 90  BUN 84* 85* 67*  CREATININE 2.44* 2.66* 1.90*  CALCIUM  9.0 8.6* 8.5*  MG 1.6*  --   --   PROT 7.2 6.4* 6.6  ALBUMIN  3.2* 2.9* 2.9*  AST 19 17 18   ALT 16 14 15   ALKPHOS 91 83 84  BILITOT 0.8 0.6 0.7  GFRNONAA 26* 23* 35*  ANIONGAP 15 10 11     Lipids No results for input(s): "CHOL", "TRIG", "HDL", "LABVLDL", "LDLCALC", "CHOLHDL" in the last 168 hours.  Hematology Recent Labs  Lab 04/09/24 1528 04/09/24 2338 04/10/24 0905 04/11/24 0220  WBC 6.2  --  5.1 5.2  RBC 3.78*  --  3.46* 3.46*  HGB 11.5* 11.2* 10.4* 10.5*  HCT 35.6* 34.7* 31.4* 31.3*  MCV 94.2  --  90.8 90.5  MCH 30.4  --  30.1 30.3  MCHC 32.3  --  33.1 33.5  RDW 13.5  --  13.7 13.7  PLT 161  --  134* 135*   Thyroid No results for input(s): "TSH", "FREET4" in the last 168 hours.  BNP Recent Labs  Lab 04/09/24 1909  BNP 566.3*    DDimer No results for input(s): "DDIMER" in the last 168 hours.   Radiology    NM Pulmonary Perfusion Result Date: 04/10/2024 CLINICAL DATA:  Pulmonary embolism, syncope concern for acute pulmonary embolism. EXAM: NUCLEAR MEDICINE PERFUSION LUNG SCAN TECHNIQUE: Perfusion images were obtained in multiple projections after intravenous injection of radiopharmaceutical. RADIOPHARMACEUTICALS:  4.4 mCi Tc-31m MAA COMPARISON:  Radiograph 04/09/2024 FINDINGS: There is a wedge-shaped peripheral perfusion defect in the posterior RIGHT lower lobe. Generalized poor perfusion to the LEFT midlung may relate to pleural effusion. IMPRESSION: Concern for RIGHT lower lobe pulmonary embolism.  High probability. These results will be called to the ordering clinician or representative by the Radiologist Assistant, and communication documented in the PACS or [employer]. Electronically Signed   By: Deboraha Fallow M.D.   On: 04/10/2024 17:51   US  RENAL Result Date: 04/09/2024 CLINICAL DATA:  Acute renal injury EXAM: RENAL / URINARY TRACT  ULTRASOUND COMPLETE COMPARISON:  12/30/2023 FINDINGS: Right Kidney: Renal measurements: 11.3 x 4.9 x 4.4 cm. = volume: 129 mL. Echogenicity within normal limits. No mass or hydronephrosis visualized. Left Kidney: Renal measurements: 11.0 x 5.1 x 4.1 cm. = volume: 122 mL. Echogenicity within normal limits. No mass or hydronephrosis visualized. Bladder: Appears normal for degree of bladder distention. Other: None. IMPRESSION: No acute abnormality in the kidneys. Electronically Signed   By: Violeta Grey M.D.   On: 04/09/2024 21:39   CT Head Wo Contrast Result Date: 04/09/2024 CLINICAL DATA:  Syncope/presyncope, cerebrovascular cause suspected EXAM: CT HEAD WITHOUT CONTRAST TECHNIQUE: Contiguous axial images were obtained from the base of the skull through the vertex without intravenous contrast. RADIATION DOSE REDUCTION: This exam was performed according to the departmental dose-optimization program which includes automated exposure control, adjustment of the mA and/or kV according to patient size and/or use of iterative reconstruction technique. COMPARISON:  CT head April 24, 21. FINDINGS: Brain: No evidence of acute infarction, hemorrhage, hydrocephalus, extra-axial collection or mass lesion/mass effect. Vascular: No hyperdense vessel identified. Skull: No acute fracture. Sinuses/Orbits: Clear sinuses.  No acute orbital findings. Other: No mastoid effusions. IMPRESSION: No evidence of acute intracranial abnormality. Electronically Signed   By: Stevenson Elbe M.D.   On: 04/09/2024 20:49   DG Chest 2 View Result Date: 04/09/2024 CLINICAL DATA:  Syncope. EXAM: CHEST - 2 VIEW COMPARISON:  December 07, 2023 FINDINGS: Multiple sternal wires and vascular clips are seen. The cardiac silhouette is enlarged and unchanged in size. An artificial aortic valve is present. Low lung volumes are noted with mild prominence of the pulmonary vasculature. There is no evidence of focal consolidation or pneumothorax. A small left  pleural effusion is suspected. Postoperative changes are seen throughout the cervical spine with multilevel degenerative changes noted throughout the thoracic spine. IMPRESSION: 1. Evidence of prior median sternotomy/CABG. 2. Low lung volumes with mild pulmonary vascular congestion. 3. Small left pleural effusion. Electronically Signed   By: Virgle Grime M.D.   On: 04/09/2024 16:18    Cardiac Studies   Echo ordered   Patient Profile     83 y.o. male with 1 to 2 day hx of fatigue, weakness, near syncope   Assessment & Plan    1  Weakness, fatigue  Pt hypotensive on arrival  BUN/CR elevated from baseline   Received IV fluids  V/Q scan high prob for PE in RLL     OVerall cause for problems appear multifactorial  He is a little more SOB this AM   Reviewed with hospitalist  BUN/ Cr are improved, still up from baseline   Plan to give 1 dose of lasix  given his SOB last night   I would hold further   Echo still pending    2  PE  Plan for anticoagulation  Pt currently on O2     3   Jx CAD   CABG in 2021   Pt has had triv trop elevation   Flat   Probably from PE     4  Hx HFrEF  Last echo LVEF 40%   Echo ordered    5  Hx TAVR Valentina Gasman, Sept 2024))   Echo today   6  Hx Atrial fib    Pt has been in SR  7   SSS   Pt s/p leadless pacemaker  8  OSA  Pt on CPAP  9  Pulmonary fibrosis  Fine rales on exam  10 CKD     Cr 1.9 today   Baseline was 1.34 in Jan   (was 2.44 on admit)  11 Hx DM  11   PVOD   s/p amputaions        For questions or updates, please contact [facility] Please consult [email or web address] for contact info under        Signed, Ola Berger, MD  04/11/2024, 7:46 AM

## 2024-04-11 NOTE — Progress Notes (Signed)
 PT Cancellation Note  Patient Details Name: Shane [name] MRN: [id number] DOB: 03/09/1941   Cancelled Treatment:    Reason Eval/Treat Not Completed: Medical issues which prohibited therapy (PT consult appreciated and chart reviewed. Pt with positive PE of the RIGHT lower lobe on perfusion scan. Per department policy with hold PT Eval until 24 hours of AC therapy as pt was not on AC PTA. Will follow up after 5/8 2326.)  Glenford Lanes, PT, DPT Acute Rehabilitation Services Office: [phone] Secure Chat Preferred  [name] 04/11/2024, 7:56 AM

## 2024-04-11 NOTE — Progress Notes (Signed)
  Echocardiogram 2D Echocardiogram has been performed.  Fain Home RDCS 04/11/2024, 11:08 AM

## 2024-04-11 NOTE — TOC Initial Note (Signed)
 Transition of Care [facility]) - Initial/Assessment Note    Patient Details  Name: Shane [name] MRN: [id number] Date of Birth: February 06, 1941  [facility] CM/SW Contact:    Cosimo Diones, RN Phone Number: 04/11/2024, 3:24 PM  Clinical Narrative:  Patient presented for syncope and hypotension. PTA patient was from home with spouse. Spouse was in the room at the time of the visit. Patient is currently active with [facility] for wound care and he wants to resume services. Patient will need resumption orders once stable. Patient has DME: scooter, wheelchair, oxygen 2 liters via [employer], CPAP, cane, and rolling walker. Case Manager will continue to follow for additional needs.                Expected Discharge Plan: Home w Home Health Services Barriers to Discharge: Continued Medical Work up   Patient Goals and CMS Choice Patient states their goals for this hospitalization and ongoing recovery are:: Plan to return home once stable.  Expected Discharge Plan and Services In-house Referral: NA Discharge Planning Services: CM Consult Post Acute Care Choice: Home Health, Resumption of Svcs/PTA Provider Living arrangements for the past 2 months: Single Family Home                   DME Agency: NA       HH Arranged: RN, Disease Management HH Agency: [facility] Date [facility] Agency Contacted: 04/11/24 Time HH Agency Contacted: 1523 Representative spoke with at [facility] Agency: Randel Buss  Prior Living Arrangements/Services Living arrangements for the past 2 months: Single Family Home Lives with:: Spouse Patient language and need for interpreter reviewed:: Yes Do you feel safe going back to the place where you live?: Yes      Need for Family Participation in Patient Care: Yes (Comment) Care giver support system in place?: Yes (comment) Current home services: DME (scooter, wheelchair, oxygen 2 liters via [employer], CPAP, cane, and rolling walker.) Criminal Activity/Legal Involvement  Pertinent to Current Situation/Hospitalization: No - Comment as needed  Activities of Daily Living   ADL Screening (condition at time of admission) Independently performs ADLs?: Yes (appropriate for developmental age) Is the patient deaf or have difficulty hearing?: No Does the patient have difficulty seeing, even when wearing glasses/contacts?: No Does the patient have difficulty concentrating, remembering, or making decisions?: No  Permission Sought/Granted Permission sought to share information with : Family Supports, Magazine features editor, Case Estate manager/land agent granted to share information with : Yes, Verbal Permission Granted     Permission granted to share info w AGENCY: Randel Buss        Emotional Assessment Appearance:: Appears stated age Attitude/Demeanor/Rapport: Engaged Affect (typically observed): Appropriate Orientation: : Oriented to Self, Oriented to Place, Oriented to  Time, Oriented to Situation Alcohol / Substance Use: Not Applicable Psych Involvement: No (comment)  Admission diagnosis:  Syncope and collapse [R55] AKI (acute kidney injury) (HCC) [N17.9] Syncope, unspecified syncope type [R55] Patient Active Problem List   Diagnosis Date Noted   Acute kidney injury superimposed on stage 3a chronic kidney disease (HCC) 04/09/2024   Impaired functional mobility, balance, gait, and endurance 04/09/2024   Non-pressure chronic ulcer of other part of left foot limited to breakdown of skin (HCC) 12/27/2023   Type 2 diabetes mellitus without complication, with long-term current use of insulin  (HCC) 12/12/2023   Esophageal dysphagia 12/11/2023   COVID-19 12/11/2023   Acute on chronic hypoxic respiratory failure (HCC) 12/10/2023   Anemia 12/10/2023   Non-ST elevation (NSTEMI) myocardial  infarction (HCC) 12/09/2023   Retroperitoneal bleeding 12/09/2023   History of transcatheter aortic valve replacement (TAVR) 12/09/2023   Chronic heart failure with preserved  ejection fraction (HFpEF) (HCC) 12/09/2023   Hx of CABG 12/09/2023   Atherosclerosis of native coronary artery of native heart without angina pectoris 12/09/2023   Hyperkalemia 12/07/2023   Acute on chronic congestive heart failure (HCC) 12/07/2023   Acute kidney injury (nontraumatic) (HCC) 12/06/2023   Acute on chronic combined systolic and diastolic CHF, NYHA class 1 (HCC) 06/22/2023   Acute on chronic systolic (congestive) heart failure (HCC) 06/21/2023   Intermediate stage nonexudative age-related macular degeneration of both eyes 04/25/2022   Research study patient 12/24/2021   Labile blood glucose    Pulmonary fibrosis (HCC)    Hyponatremia    S/P BKA (below knee amputation) unilateral, right (HCC)    Shortness of breath    Atrial fibrillation (HCC)    Postoperative pain    Atherosclerosis of native arteries of extremities with gangrene, right leg (HCC)    Hardware complicating wound infection (HCC)    Abscess of bursa of right ankle 02/15/2021   Endocarditis 01/19/2021   PAD (peripheral artery disease) (HCC) 12/29/2020   Chronic systolic CHF (congestive heart failure) (HCC) 12/22/2020   History of COVID-19 12/22/2020   SSS (sick sinus syndrome) (HCC) 12/22/2020   Acute bacterial endocarditis    MSSA bacteremia 12/09/2020   Diabetic foot ulcer (HCC) 12/09/2020   Foot drop, right foot    Generalized weakness 12/08/2020   Atrial fibrillation, chronic (HCC) 12/08/2020   Elevated troponin level not due myocardial infarction 12/08/2020   Adrenal insufficiency (HCC) 11/14/2020   Orthostatic hypotension 11/14/2020   Physical deconditioning 11/14/2020   Difficulty sleeping 11/07/2020   Right ventricular dysfunction 11/05/2020   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/04/2020   Hearing loss 11/03/2020   Cardiac device in situ 09/09/2020   COVID-19 virus infection 07/18/2020   Coronary artery disease involving native heart without angina pectoris/ S/P CABG x3 07/10/2020   Chronic kidney disease, stage  3a (HCC) 03/29/2020   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/28/2020   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) 03/16/2020   Right epiretinal membrane 03/16/2020   Posterior vitreous detachment of right eye 03/16/2020   Aortic stenosis, moderate 03/12/2020   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   Near syncope 04/27/2018   Hypoglycemia due to insulin  01/06/2018   Essential hypertension 01/06/2018   Syncope and collapse 01/05/2018   Encounter for therapeutic drug monitoring 06/29/2017   OSA on CPAP 05/05/2017   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HCC) 01/05/2017   Abnormal chest x-ray 10/12/2016   Benign neoplasm of colon 01/14/2013   Gastro-esophageal reflux disease without esophagitis 11/24/2009   PCP:  Suan Elm, MD Pharmacy:   [facility] [phone] - [place], [place] - [address] AT [facility] [address] [place] [address] Phone: [phone] Fax: [phone]  [facility] - [place], [place] - [address] [place] [address] Phone: [phone] Fax: [phone]  Social Drivers of Health (SDOH) Social History: SDOH Screenings   Food Insecurity: No Food Insecurity (04/10/2024)  Housing: Low Risk  (04/10/2024)  Transportation Needs: No Transportation Needs (04/10/2024)  Utilities: Not At Risk (04/10/2024)  Depression (PHQ2-9): Low Risk  (01/11/2021)  Financial Resource Strain: Low Risk  (08/22/2023)   Received from [facility]  Social Connections: Moderately Integrated (04/10/2024)  Tobacco Use: Low  Risk  (04/09/2024)   Readmission Risk Interventions    12/15/2023    2:29 PM  Readmission Risk Prevention Plan  Transportation Screening Complete  Medication Review (RN Care Manager) Complete  PCP or Specialist appointment within 3-5 days of discharge Complete  HRI or Home Care Consult Complete  SW Recovery  Care/Counseling Consult Complete  Palliative Care Screening Complete  Skilled Nursing Facility Not Applicable

## 2024-04-11 NOTE — Significant Event (Signed)
 Rapid Response Event Note   Reason for Call :  Progressive SOB t/o day  Initial Focused Assessment:  Pt lying in bed with eyes open. His breathing is mildly tachypneic/labored. He is c/o SOB that has gotten worse t/o the day with increased sputum production. He denies CP. He gets very winded when answering questions but is able to speak in complete sentences.  He is coughing up thick tan secretions.  Lungs with crackles bilaterally. ABD large, soft, nontender. Skin warm and dry.   T-99, HR-80, BP-138/67, RR-22, SpO2-100% on 3L [place].  Interventions:  Albuterol  tx given PTA RRT PCXR-Pulmonary edema with possible small bilateral pleural effusions. Diffuse pneumonia could appear similarly. EKG-V-paced rhythm Trop-27 BNP-1178.8 Pct-<0.10 Lasix  40mg  IV Plan of Care:  Given lasix  and monitor response. Continue to monitor pt closely. Please call RRT if further assistance needed.   Event Summary:   MD Notified: Rathore notified by bedside RN Call Time:2009 Arrival [id number] End [id number]  Dorrine Gaudy, RN

## 2024-04-11 NOTE — Progress Notes (Signed)
 PROGRESS NOTE    Shane [name]  ZOX:[id number] DOB: February 08, 1941 DOA: 04/09/2024 PCP: Suan Elm, MD  82/M with CAD, CABG, chronic diastolic CHF, A-fib, sick sinus syndrome with PPM, OSA, pulmonary fibrosis, PAD, right BKA, left TMA with small wounds, TAVR in 9/24 was hospitalized in 1/25 with prolonged respiratory failure related to CHF and COVID-pneumonia, required CRRT. - Brought to [facility] ED 5/6 following syncopal episode while at his rental property, he did notice lightheadedness and dizziness prior to and felt generally weaker yesterday.  EMS noted hypotension, given IV fluids, ED blood pressure was 95/54, creatinine up to 2.4, mag 1.6, hemoglobin 11.5, chest x-ray with mild pulmonary vascular congestion, lactic acid 1.1   Subjective: - Shortness of breath overnight, some orthopnea  Assessment and Plan:  Syncope and collapse Hypotension -Pacer interrogation noted normal function -Lisinopril  discontinued, metoprolol  held, -VQ scan positive for right lower lobe pulmonary embolism -Follow-up repeat echo, no fever or leukocytosis, blood cultures negative so far  Acute PE - Start DOAC today, follow-up echo  Acute kidney injury superimposed on stage 3a chronic kidney disease - Hemodynamically mediated from hypotension -improving - Hold lisinopril  - Renal ultrasound without hydronephrosis  Acute on chronic systolic CHF - Previous echo noted EF of 40%, follow-up repeat echo today - IV Lasix  X1 - GDMT limited by hypotension/AKI  Diabetic foot ulcer (HCC) -Anterior wound superficial without any signs or symptoms of infection, left foot wound is deeper but without signs or symptoms of secondary infection, continue routine wound care - Follow-up with Dr. Julio Ohm  Type 2 diabetes mellitus without complication, with long-term current use of insulin  (HCC) -CBGs are stable, Tresiba  on hold, A1c previous 1 was 8.5.  Anemia Mild, stable, monitor  PAD (peripheral artery disease)  (HCC) Suspect pt has chronic limb ischemia.  Per wife pt's physician uses doppler device to check his pulses in his feet. - Followed by Dr. Julio Ohm  Adrenal insufficiency [facility]) -History of adrenal insufficiency, random cortisol yesterday was acceptable at 8.5  Coronary artery disease involving native heart without angina pectoris/ S/P CABG x3 Stable, continue statin, holding metoprolol   OSA on CPAP CPAP continue per home settings.  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HCC) At baseline patient is set to have IPF and is to use as needed oxygen during the day and CPAP at night.  DVT prophylaxis: Add Lovenox  Code Status: Discussed CODE STATUS with the patient, DNR Family Communication: Spouse at bedside Disposition Plan: Home tomorrow f stable  Consultants:    Procedures:   Antimicrobials:    Objective: Vitals:   04/10/24 2059 04/10/24 2320 04/11/24 0420 04/11/24 0740  BP:  103/60 (!) 123/59 126/72  Pulse: 73 65 79 67  Resp: 17 (!) 24 (!) 23 18  Temp:  98.5 F (36.9 C) 98.3 F (36.8 C) 98.4 F (36.9 C)  TempSrc:  Oral Oral Oral  SpO2: 98% 98% 98%   Weight:   109.8 kg   Height:        Intake/Output Summary (Last 24 hours) at 04/11/2024 1057 Last data filed at 04/11/2024 0924 Gross per 24 hour  Intake 360 ml  Output 1905 ml  Net -1545 ml   Filed Weights   04/09/24 2328 04/10/24 0601 04/11/24 0420  Weight: 102.9 kg 108.4 kg 109.8 kg    Examination:  General exam: Appears calm and comfortable  HEENT: Pos JVD Respiratory system: Significant bibasilar rales Cardiovascular system: S1 & S2 heard, RRR.  Abd: nondistended, soft and nontender.Normal bowel sounds heard. Central nervous system: Alert  and oriented. No focal neurological deficits. Extremities: BKA, transmetatarsal amputation with 2 wounds Skin: As above Psychiatry:  Mood & affect appropriate.     Data Reviewed:   CBC: Recent Labs  Lab 04/09/24 1528 04/09/24 2338 04/10/24 0905 04/11/24 0220  WBC  6.2  --  5.1 5.2  NEUTROABS 4.0  --   --   --   HGB 11.5* 11.2* 10.4* 10.5*  HCT 35.6* 34.7* 31.4* 31.3*  MCV 94.2  --  90.8 90.5  PLT 161  --  134* 135*   Basic Metabolic Panel: Recent Labs  Lab 04/09/24 1528 04/10/24 0905 04/11/24 0220  NA 136 134* 134*  K 4.6 5.0 4.5  CL 99 99 100  CO2 22 25 23   GLUCOSE 86 135* 90  BUN 84* 85* 67*  CREATININE 2.44* 2.66* 1.90*  CALCIUM  9.0 8.6* 8.5*  MG 1.6*  --   --    GFR: Estimated Creatinine Clearance: 38.4 mL/min (A) (by C-G formula based on SCr of 1.9 mg/dL (H)). Liver Function Tests: Recent Labs  Lab 04/09/24 1528 04/10/24 0905 04/11/24 0220  AST 19 17 18   ALT 16 14 15   ALKPHOS 91 83 84  BILITOT 0.8 0.6 0.7  PROT 7.2 6.4* 6.6  ALBUMIN  3.2* 2.9* 2.9*   No results for input(s): "LIPASE", "AMYLASE" in the last 168 hours. No results for input(s): "AMMONIA" in the last 168 hours. Coagulation Profile: No results for input(s): "INR", "PROTIME" in the last 168 hours. Cardiac Enzymes: Recent Labs  Lab 04/09/24 2338  CKTOTAL 71   BNP (last 3 results) No results for input(s): "PROBNP" in the last 8760 hours. HbA1C: Recent Labs    04/09/24 2338  HGBA1C 8.7*   CBG: Recent Labs  Lab 04/10/24 1148 04/10/24 1711 04/10/24 2140 04/11/24 0517 04/11/24 0733  GLUCAP 145* 159* 104* 102* 112*   Lipid Profile: No results for input(s): "CHOL", "HDL", "LDLCALC", "TRIG", "CHOLHDL", "LDLDIRECT" in the last 72 hours. Thyroid Function Tests: No results for input(s): "TSH", "T4TOTAL", "FREET4", "T3FREE", "THYROIDAB" in the last 72 hours. Anemia Panel: No results for input(s): "VITAMINB12", "FOLATE", "FERRITIN", "TIBC", "IRON ", "RETICCTPCT" in the last 72 hours. Urine analysis:    Component Value Date/Time   COLORURINE YELLOW 04/10/2024 0535   APPEARANCEUR CLEAR 04/10/2024 0535   LABSPEC 1.025 04/10/2024 0535   PHURINE 5.5 04/10/2024 0535   GLUCOSEU NEGATIVE 04/10/2024 0535   HGBUR NEGATIVE 04/10/2024 0535   BILIRUBINUR  NEGATIVE 04/10/2024 0535   KETONESUR NEGATIVE 04/10/2024 0535   PROTEINUR NEGATIVE 04/10/2024 0535   UROBILINOGEN 1.0 09/27/2011 1112   NITRITE NEGATIVE 04/10/2024 0535   LEUKOCYTESUR NEGATIVE 04/10/2024 0535   Sepsis Labs: @LABRCNTIP (procalcitonin:4,lacticidven:4)  ) Recent Results (from the past 240 hours)  Culture, blood (Routine X 2) w Reflex to ID Panel     Status: None (Preliminary result)   Collection Time: 04/09/24  8:49 PM   Specimen: BLOOD  Result Value Ref Range Status   Specimen Description BLOOD BLOOD LEFT HAND  Final   Special Requests   Final    BOTTLES DRAWN AEROBIC ONLY Blood Culture adequate volume   Culture   Final    NO GROWTH < 12 HOURS Performed at [facility], [address] [place], [place] [address]    Report Status PENDING  Incomplete  Culture, blood (Routine X 2) w Reflex to ID Panel     Status: None (Preliminary result)   Collection Time: 04/09/24 11:40 PM   Specimen: BLOOD RIGHT HAND  Result Value Ref  Range Status   Specimen Description BLOOD RIGHT HAND  Final   Special Requests   Final    BOTTLES DRAWN AEROBIC AND ANAEROBIC Blood Culture adequate volume   Culture   Final    NO GROWTH < 12 HOURS Performed at [facility], [address], [place], [place] [address]    Report Status PENDING  Incomplete     Radiology Studies: NM Pulmonary Perfusion Result Date: 04/10/2024 CLINICAL DATA:  Pulmonary embolism, syncope concern for acute pulmonary embolism. EXAM: NUCLEAR MEDICINE PERFUSION LUNG SCAN TECHNIQUE: Perfusion images were obtained in multiple projections after intravenous injection of radiopharmaceutical. RADIOPHARMACEUTICALS:  4.4 mCi Tc-26m MAA COMPARISON:  Radiograph 04/09/2024 FINDINGS: There is a wedge-shaped peripheral perfusion defect in the posterior RIGHT lower lobe. Generalized poor perfusion to the LEFT midlung may relate to pleural effusion. IMPRESSION: Concern for RIGHT lower lobe pulmonary embolism.  High probability.  These results will be called to the ordering clinician or representative by the Radiologist Assistant, and communication documented in the PACS or [employer]. Electronically Signed   By: Deboraha Fallow M.D.   On: 04/10/2024 17:51   US  RENAL Result Date: 04/09/2024 CLINICAL DATA:  Acute renal injury EXAM: RENAL / URINARY TRACT ULTRASOUND COMPLETE COMPARISON:  12/30/2023 FINDINGS: Right Kidney: Renal measurements: 11.3 x 4.9 x 4.4 cm. = volume: 129 mL. Echogenicity within normal limits. No mass or hydronephrosis visualized. Left Kidney: Renal measurements: 11.0 x 5.1 x 4.1 cm. = volume: 122 mL. Echogenicity within normal limits. No mass or hydronephrosis visualized. Bladder: Appears normal for degree of bladder distention. Other: None. IMPRESSION: No acute abnormality in the kidneys. Electronically Signed   By: Violeta Grey M.D.   On: 04/09/2024 21:39   CT Head Wo Contrast Result Date: 04/09/2024 CLINICAL DATA:  Syncope/presyncope, cerebrovascular cause suspected EXAM: CT HEAD WITHOUT CONTRAST TECHNIQUE: Contiguous axial images were obtained from the base of the skull through the vertex without intravenous contrast. RADIATION DOSE REDUCTION: This exam was performed according to the departmental dose-optimization program which includes automated exposure control, adjustment of the mA and/or kV according to patient size and/or use of iterative reconstruction technique. COMPARISON:  CT head April 24, 21. FINDINGS: Brain: No evidence of acute infarction, hemorrhage, hydrocephalus, extra-axial collection or mass lesion/mass effect. Vascular: No hyperdense vessel identified. Skull: No acute fracture. Sinuses/Orbits: Clear sinuses.  No acute orbital findings. Other: No mastoid effusions. IMPRESSION: No evidence of acute intracranial abnormality. Electronically Signed   By: Stevenson Elbe M.D.   On: 04/09/2024 20:49   DG Chest 2 View Result Date: 04/09/2024 CLINICAL DATA:  Syncope. EXAM: CHEST - 2 VIEW  COMPARISON:  December 07, 2023 FINDINGS: Multiple sternal wires and vascular clips are seen. The cardiac silhouette is enlarged and unchanged in size. An artificial aortic valve is present. Low lung volumes are noted with mild prominence of the pulmonary vasculature. There is no evidence of focal consolidation or pneumothorax. A small left pleural effusion is suspected. Postoperative changes are seen throughout the cervical spine with multilevel degenerative changes noted throughout the thoracic spine. IMPRESSION: 1. Evidence of prior median sternotomy/CABG. 2. Low lung volumes with mild pulmonary vascular congestion. 3. Small left pleural effusion. Electronically Signed   By: Virgle Grime M.D.   On: 04/09/2024 16:18     Scheduled Meds:  atorvastatin   80 mg Oral QHS   benzonatate   100 mg Oral BID   escitalopram   10 mg Oral q AM   furosemide   40 mg Intravenous Once   insulin   aspart  0-15 Units Subcutaneous TID WC   leptospermum manuka honey  1 Application Topical Daily   midodrine  5 mg Oral BID WC   pantoprazole   40 mg Oral BID   sodium chloride  flush  3 mL Intravenous Q12H   Continuous Infusions:   LOS: 2 days    Time spent:    Deforest Fast, MD [facility]   04/11/2024, 10:57 AM

## 2024-04-12 ENCOUNTER — Other Ambulatory Visit (HOSPITAL_COMMUNITY): Payer: Self-pay

## 2024-04-12 ENCOUNTER — Telehealth (HOSPITAL_COMMUNITY): Payer: Self-pay | Admitting: Pharmacy Technician

## 2024-04-12 DIAGNOSIS — R55 Syncope and collapse: Secondary | ICD-10-CM | POA: Diagnosis not present

## 2024-04-12 LAB — BASIC METABOLIC PANEL WITH GFR
Anion gap: 11 (ref 5–15)
BUN: 53 mg/dL — ABNORMAL HIGH (ref 8–23)
CO2: 26 mmol/L (ref 22–32)
Calcium: 8.9 mg/dL (ref 8.9–10.3)
Chloride: 97 mmol/L — ABNORMAL LOW (ref 98–111)
Creatinine, Ser: 1.59 mg/dL — ABNORMAL HIGH (ref 0.61–1.24)
GFR, Estimated: 43 mL/min — ABNORMAL LOW (ref >60)
Glucose, Bld: 103 mg/dL — ABNORMAL HIGH (ref 70–99)
Potassium: 4.3 mmol/L (ref 3.5–5.1)
Sodium: 134 mmol/L — ABNORMAL LOW (ref 135–145)

## 2024-04-12 LAB — CBC
HCT: 34.7 % — ABNORMAL LOW (ref 39.0–52.0)
Hemoglobin: 11.5 g/dL — ABNORMAL LOW (ref 13.0–17.0)
MCH: 29.9 pg (ref 26.0–34.0)
MCHC: 33.1 g/dL (ref 30.0–36.0)
MCV: 90.4 fL (ref 80.0–100.0)
Platelets: 137 10*3/uL — ABNORMAL LOW (ref 150–400)
RBC: 3.84 MIL/uL — ABNORMAL LOW (ref 4.22–5.81)
RDW: 13.7 % (ref 11.5–15.5)
WBC: 5.7 10*3/uL (ref 4.0–10.5)
nRBC: 0 % (ref 0.0–0.2)

## 2024-04-12 LAB — GLUCOSE, CAPILLARY
Glucose-Capillary: 112 mg/dL — ABNORMAL HIGH (ref 70–99)
Glucose-Capillary: 130 mg/dL — ABNORMAL HIGH (ref 70–99)
Glucose-Capillary: 131 mg/dL — ABNORMAL HIGH (ref 70–99)
Glucose-Capillary: 132 mg/dL — ABNORMAL HIGH (ref 70–99)
Glucose-Capillary: 136 mg/dL — ABNORMAL HIGH (ref 70–99)

## 2024-04-12 LAB — RESPIRATORY PANEL BY PCR

## 2024-04-12 LAB — PROCALCITONIN: Procalcitonin: <0.1 ng/mL

## 2024-04-12 MED ORDER — ONDANSETRON HCL 4 MG/2ML IJ SOLN
4.0000 mg | Freq: Four times a day (QID) | INTRAMUSCULAR | Status: DC | PRN
  Administered 2024-04-12 – 2024-04-13 (×3): 4 mg via INTRAVENOUS
  Filled 2024-04-12 (×3): qty 2

## 2024-04-12 MED ORDER — APIXABAN 5 MG PO TABS: 5.0000 mg | ORAL_TABLET | Freq: Two times a day (BID) | ORAL | Status: DC

## 2024-04-12 MED ORDER — APIXABAN 5 MG PO TABS
10.0000 mg | ORAL_TABLET | Freq: Two times a day (BID) | ORAL | Status: DC
  Administered 2024-04-12 – 2024-04-14 (×5): 10 mg via ORAL
  Filled 2024-04-12 (×5): qty 2

## 2024-04-12 MED ORDER — BENZONATATE 100 MG PO CAPS
200.0000 mg | ORAL_CAPSULE | Freq: Two times a day (BID) | ORAL | Status: DC
  Administered 2024-04-12 – 2024-04-14 (×5): 200 mg via ORAL
  Filled 2024-04-12 (×5): qty 2

## 2024-04-12 MED ORDER — ONDANSETRON HCL 4 MG/2ML IJ SOLN
4.0000 mg | Freq: Once | INTRAMUSCULAR | Status: AC | PRN
  Administered 2024-04-12: 4 mg via INTRAVENOUS
  Filled 2024-04-12: qty 2

## 2024-04-12 MED ORDER — HYDROCOD POLI-CHLORPHE POLI ER 10-8 MG/5ML PO SUER
5.0000 mL | Freq: Once | ORAL | Status: AC
  Administered 2024-04-12: 5 mL via ORAL
  Filled 2024-04-12: qty 5

## 2024-04-12 MED ORDER — FUROSEMIDE 10 MG/ML IJ SOLN
40.0000 mg | Freq: Two times a day (BID) | INTRAMUSCULAR | Status: AC
  Administered 2024-04-12 (×2): 40 mg via INTRAVENOUS
  Filled 2024-04-12 (×2): qty 4

## 2024-04-12 NOTE — Progress Notes (Signed)
 PHARMACY - ANTICOAGULATION CONSULT NOTE  Pharmacy Consult for apixaban  Indication: pulmonary embolus  Allergies  Allergen Reactions   Codeine Hives and Itching   Ofev  [Nintedanib ] Diarrhea    SEVERE DIARRHEA   Pirfenidone  Diarrhea and Other (See Comments)    Esbriet  (Pirfenidone ) causes elevated LFTs. D/C on 06/14/17 and SEVERE DIARRHEA     Patient Measurements: Height: 6' (182.9 cm) Weight: 106.2 kg (234 lb 1.6 oz) IBW/kg (Calculated) : 77.6 HEPARIN  DW (KG): 98.8  Vital Signs: Temp: 98.7 F (37.1 C) (05/09 0438) Temp Source: Oral (05/09 0438) BP: 125/66 (05/09 0438) Pulse Rate: 89 (05/09 0438)  Labs: Recent Labs    04/09/24 2338 04/10/24 0905 04/11/24 0015 04/11/24 0220 04/11/24 2044 04/12/24 0407  HGB 11.2* 10.4*  --  10.5*  --  11.5*  HCT 34.7* 31.4*  --  31.3*  --  34.7*  PLT  --  134*  --  135*  --  137*  CREATININE  --  2.66*  --  1.90*  --  1.59*  CKTOTAL 71  --   --   --   --   --   TROPONINIHS 23*  --  33* 30* 27*  --     Estimated Creatinine Clearance: 45.1 mL/min (A) (by C-G formula based on SCr of 1.59 mg/dL (H)).   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  Immunocompromise high risk patient  -ID consulted for further therapies and will administer regeneron as OP tomorrow   -Decadron  discontinued as patient is off O2     Depression    Diabetes mellitus    Vgo disposal insulin  bolus  simular to insulin  pump   Dyspnea    GERD (gastroesophageal reflux disease)    History of kidney stones    passed   Hyperlipidemia    Hypertension    Idiopathic pulmonary fibrosis (HCC) 11/2016   ILD (interstitial lung disease) (HCC)    Moderate aortic stenosis     a. 10/2019 Echo: EF 55-60%, Gr2 DD. Nl RV.    Neuromuscular disorder (HCC)    Neuropathy associated with endocrine disorder (HCC)    Nonobstructive CAD (coronary artery disease)    a. 2012 Cath: mod, nonobs dzs; b. 10/2016 MV: EF 60%, no ischemia.   OSA on CPAP 05/05/2017   Unattended Home Sleep Test 7/2/813-AHI 38.6/hour, desaturation to 64%, body weight 261 pounds   PONV (postoperative nausea and vomiting)    Postoperative anemia due to acute blood loss 11/07/2020   Postoperative hemorrhagic shock 03/20/2021   Sleep apnea     uses cpap asked to bring mask and tubing      Assessment: 18 yoM admitted with syncope, VQ scan high probability PE. Pt initially given enoxaparin , now transitioned to apixaban .   Plan:  Apixaban  10mg  BID x7d then 5mg  BID  Levin Reamer, PharmD, BCPS, [facility] Clinical Pharmacist [phone] Please check AMION for all [facility] Pharm[facility] numbers 04/12/2024

## 2024-04-12 NOTE — Progress Notes (Signed)
 Pain is having uncontrolled pleuretic pain that is causing the patient to be nauseous and dry heave. Rathore MD notified with a request to add morphine and zofran .

## 2024-04-12 NOTE — Evaluation (Signed)
 Physical Therapy Evaluation Patient Details Name: Shane [name] MRN: [id number] DOB: 05/15/1941 Today's Date: 04/12/2024  History of Present Illness  Pt is a 83 y.o. male admitted 04/09/24 following syncopal episode while at his rental property. Pt noticed lightheadedness and dizziness prior to and felt generally weaker.  EMS noted hypotension, given IV fluids, ED blood pressure was 95/54. Chest x-ray with mild pulmonary vascular congestion. VQ scan positive for right lower lobe pulmonary embolism. PMH of CAD, CABG, chronic diastolic CHF, A-fib, sick sinus syndrome with PPM, OSA, pulmonary fibrosis, PAD, right BKA, left TMA with small wounds, TAVR in 9/24, hospitalized 1/25 with prolonged respiratory failure related to CHF and COVID-pneumonia, required CRRT.   Clinical Impression  Pt admitted with above diagnosis. PTA, pt was modI for functional mobility using a w/c or electric scooter. He wears a RLE prosthesis, will transfer, walk short distances using RW, and complete stairs with BUE support. Pt is modI for ADLs and is very active managing rental properties. He relies on his wife for IADLs. He lives in a two story house with a ramped entrance where he can reside on the main level. Pt currently with functional limitations due to the deficits listed below (see PT Problem List). He required minA for bed mobility and CGA-minA to maintain seated balance d/t posterior and left lateral lean. Pt is significantly below his baseline function with generalized deconditioning and poor cardiopulmonary endurance. He developed worsening SOB, demonstrated labored breathing, and maintained upright for <30 seconds. Pt with consistent cough that was intermittently productive and led to 8/10 pleuritic pain. He c/o dizziness with position change although BP was stable. Pt will benefit from acute skilled PT to increase his independence and safety with mobility to allow discharge. Recommend intensive inpatient follow-up therapy,  >3 hours/day.       If plan is discharge home, recommend the following: A lot of help with walking and/or transfers;A little help with bathing/dressing/bathroom;Assist for transportation;Help with stairs or ramp for entrance   Can travel by private vehicle        Equipment Recommendations None recommended by PT (Pt already has DME)  Recommendations for Other Services  Rehab consult    Functional Status Assessment Patient has had a recent decline in their functional status and demonstrates the ability to make significant improvements in function in a reasonable and predictable amount of time.     Precautions / Restrictions Precautions Precautions: Fall Recall of Precautions/Restrictions: Intact Restrictions Weight Bearing Restrictions Per Provider Order: No      Mobility  Bed Mobility Overal bed mobility: Needs Assistance Bed Mobility: Supine to Sit, Sit to Supine     Supine to sit: Min assist, HOB elevated, Used rails Sit to supine: Min assist   General bed mobility comments: Pt sat up on L side of bed. Assist to elevate trunk. Pt scooted fwd with BUE support. Assist to bring BLE back into bed. Repositioned using bed features and +2 assist.    Transfers                   General transfer comment: Deferred secondary to worsening SOB and evident laborious breathing. Pt also c/o dizziness although BP was stable.    Ambulation/Gait                  Stairs            Wheelchair Mobility     Tilt Bed    Modified Rankin (Stroke Patients Only)  Balance Overall balance assessment: Needs assistance Sitting-balance support: Feet supported, Bilateral upper extremity supported Sitting balance-Leahy Scale: Fair Sitting balance - Comments: Pt required CGA-minA to maintain seated balance EOB d/t lean and inability to tolerate positioning for >30sec. Postural control: Posterior lean, Left lateral lean                                    Pertinent Vitals/Pain Pain Assessment Pain Assessment: 0-10 Pain Score: 8  Pain Location: pleuretic when coughing Pain Descriptors / Indicators: Discomfort, Pressure, Tightness Pain Intervention(s): Monitored during session, Limited activity within patient's tolerance    Home Living Family/patient expects to be discharged to:: Private residence Living Arrangements: Spouse/significant other Available Help at Discharge: Family;Available PRN/intermittently Type of Home: House Home Access: Ramped entrance       Home Layout: Two level;Able to live on main level with bedroom/bathroom Home Equipment: Shower seat;Rolling Walker (2 wheels);Electric scooter;Wheelchair - manual;Cane - single point;Other (comment);Grab bars - tub/shower;Grab bars - toilet      Prior Function Prior Level of Function : Working/employed;Independent/Modified Independent             Mobility Comments: Pt reports he doesn't walk much, ut can complete short distances using RW and R BKA prosthesis. Typically will transfer to w/c or scooter. 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 States he can navigate stairs with a rail. Pt reports a couple of LOB but only 1 fall that led to admission. ADLs Comments: ModI with ADLs. Wife does IADLs. He can drive, but doesn't/     Extremity/Trunk Assessment   Upper Extremity Assessment Upper Extremity Assessment: Generalized weakness;Right hand dominant    Lower Extremity Assessment Lower Extremity Assessment: Generalized weakness;RLE deficits/detail;LLE deficits/detail RLE Deficits / Details: R BKA LLE Deficits / Details: L transmetatarsal amputation with chronic wound on distolateral foot.    Cervical / Trunk Assessment Cervical / Trunk Assessment: Kyphotic  Communication   Communication Communication: No apparent difficulties    Cognition Arousal: Alert Behavior During Therapy: WFL for tasks assessed/performed   PT -  Cognitive impairments: No apparent impairments                         Following commands: Intact       Cueing Cueing Techniques: Verbal cues     General Comments General comments (skin integrity, edema, etc.): Pt greeted on 2L O2 via [place], SpO2 >95% throughout session. Pt c/o SOB and worsening pleuritic pain with coughing and wet productive cough.  BP: supine 118/59 and seated 105/56.    Exercises Other Exercises Other Exercises: Incentive Spirometer (Educated pt on proper technique. Instructed him to complete 10 reps every hour.)   Assessment/Plan    PT Assessment Patient needs continued PT services  PT Problem List Cardiopulmonary status limiting activity;Decreased strength;Decreased activity tolerance;Decreased balance;Decreased mobility       PT Treatment Interventions DME instruction;Functional mobility training;Therapeutic activities;Therapeutic exercise;Balance training;Wheelchair mobility training;Patient/family education;Gait training;Stair training    PT Goals (Current goals can be found in the Care Plan section)  Acute Rehab PT Goals Patient Stated Goal: Regain my independence and not have difficulty breathing. PT Goal Formulation: With patient/family Time For Goal Achievement: 04/26/24 Potential to Achieve Goals: Good    Frequency Min 3X/week     Co-evaluation               AM-PAC PT "6 Clicks" Mobility  Outcome Measure Help needed turning from your back to your side while in a flat bed without using bedrails?: A Little Help needed moving from lying on your back to sitting on the side of a flat bed without using bedrails?: A Little Help needed moving to and from a bed to a chair (including a wheelchair)?: A Lot Help needed standing up from a chair using your arms (e.g., wheelchair or bedside chair)?: A Lot Help needed to walk in hospital room?: A Lot Help needed climbing 3-5 steps with a railing? : A Lot 6 Click Score: 14    End of Session  Equipment Utilized During Treatment: Oxygen Activity Tolerance: Patient limited by fatigue;Patient limited by pain Patient left: in bed;with call bell/phone within reach;with family/visitor present Nurse Communication: Mobility status PT Visit Diagnosis: Muscle weakness (generalized) (M62.81);Difficulty in walking, not elsewhere classified (R26.2);Other abnormalities of gait and mobility (R26.89)    Time: 8657-8469 PT Time Calculation (min) (ACUTE ONLY): 24 min   Charges:   PT Evaluation $PT Eval Moderate Complexity: 1 Mod   PT General Charges $$ ACUTE PT VISIT: 1 Visit         Glenford Lanes, PT, DPT Acute Rehabilitation Services Office: [phone] Secure Chat Preferred  [name] 04/12/2024, 9:16 AM

## 2024-04-12 NOTE — Progress Notes (Signed)
 Rounding Note    Patient Name: Shane [name] Date of Encounter: 04/12/2024  [facility] Cardiologist: Alexandria Angel, MD   Subjective   Pt had a bad night last night   Coughing   SOB  Nauseated  Inpatient Medications    Scheduled Meds:  apixaban   2.5 mg Oral BID   atorvastatin   80 mg Oral QHS   benzonatate   100 mg Oral BID   chlorpheniramine-HYDROcodone   5 mL Oral Once   escitalopram   10 mg Oral q AM   insulin  aspart  0-15 Units Subcutaneous TID WC   leptospermum manuka honey  1 Application Topical Daily   midodrine   5 mg Oral BID WC   pantoprazole   40 mg Oral BID   sodium chloride  flush  3 mL Intravenous Q12H   Continuous Infusions:   PRN Meds: albuterol , guaiFENesin -dextromethorphan , traZODone    Vital Signs    Vitals:   04/11/24 2340 04/11/24 2345 04/11/24 2350 04/12/24 0438  BP:    125/66  Pulse: 64 79 71 89  Resp: 17 19 18  (!) 23  Temp:    98.7 F (37.1 C)  TempSrc:    Oral  SpO2: 99% 99% 100% 100%  Weight:    106.2 kg  Height:        Intake/Output Summary (Last 24 hours) at 04/12/2024 0701 Last data filed at 04/12/2024 0436 Gross per 24 hour  Intake 240 ml  Output 3255 ml  Net -3015 ml      04/12/2024    4:38 AM 04/11/2024    4:20 AM 04/10/2024    6:01 AM  Last 3 Weights  Weight (lbs) 234 lb 1.6 oz 242 lb 239 lb  Weight (kg) 106.187 kg 109.77 kg 108.41 kg      Telemetry    SR - Personally Reviewed  ECG    No new  - Personally Reviewed  Physical Exam   GEN: No acute distress.   Neck: Neck is full  Cardiac: RRR, no murmur  Respiratory: Fine rales bilateral bases   GI: Soft, nontender, non-distended  MS: No LE edema  s/p R BKA   S.p L transmet amp  Labs    High Sensitivity Troponin:   Recent Labs  Lab 04/09/24 1909 04/09/24 2338 04/11/24 0015 04/11/24 0220 04/11/24 2044  TROPONINIHS 24* 23* 33* 30* 27*     Chemistry Recent Labs  Lab 04/09/24 1528 04/10/24 0905 04/11/24 0220 04/12/24 0407  NA 136 134* 134* 134*   K 4.6 5.0 4.5 4.3  CL 99 99 100 97*  CO2 22 25 23 26   GLUCOSE 86 135* 90 103*  BUN 84* 85* 67* 53*  CREATININE 2.44* 2.66* 1.90* 1.59*  CALCIUM  9.0 8.6* 8.5* 8.9  MG 1.6*  --   --   --   PROT 7.2 6.4* 6.6  --   ALBUMIN  3.2* 2.9* 2.9*  --   AST 19 17 18   --   ALT 16 14 15   --   ALKPHOS 91 83 84  --   BILITOT 0.8 0.6 0.7  --   GFRNONAA 26* 23* 35* 43*  ANIONGAP 15 10 11 11     Lipids No results for input(s): "CHOL", "TRIG", "HDL", "LABVLDL", "LDLCALC", "CHOLHDL" in the last 168 hours.  Hematology Recent Labs  Lab 04/10/24 0905 04/11/24 0220 04/12/24 0407  WBC 5.1 5.2 5.7  RBC 3.46* 3.46* 3.84*  HGB 10.4* 10.5* 11.5*  HCT 31.4* 31.3* 34.7*  MCV 90.8 90.5 90.4  MCH 30.1 30.3  29.9  MCHC 33.1 33.5 33.1  RDW 13.7 13.7 13.7  PLT 134* 135* 137*   Thyroid No results for input(s): "TSH", "FREET4" in the last 168 hours.  BNP Recent Labs  Lab 04/09/24 1909 04/11/24 2159  BNP 566.3* 1,178.8*    DDimer No results for input(s): "DDIMER" in the last 168 hours.   Radiology    DG Chest Port 1 View Result Date: 04/11/2024 CLINICAL DATA:  Acute respiratory distress EXAM: PORTABLE CHEST 1 VIEW COMPARISON:  Chest radiograph 04/09/2024 FINDINGS: Stable cardiomediastinal silhouette. TAVR. Leadless pacer. Sternotomy. Hazy airspace and interstitial opacities bilaterally greatest in the left mid and lower lung. Question small bilateral pleural effusions. No pneumothorax. IMPRESSION: Pulmonary edema with possible small bilateral pleural effusions. Diffuse pneumonia could appear similarly. Electronically Signed   By: Rozell Cornet M.D.   On: 04/11/2024 21:16   ECHOCARDIOGRAM COMPLETE Result Date: 04/11/2024    ECHOCARDIOGRAM REPORT   Patient Name:   Shane [name] Date of Exam: 04/11/2024 Medical Rec #:  [id number]        Height:       72.0 in Accession #:    [id number]       Weight:       242.0 lb Date of Birth:  1941/07/06        BSA:          2.310 m Patient Age:    83 years         BP:            126/72 mmHg Patient Gender: M                HR:           63 bpm. Exam Location:  Inpatient Procedure: 2D Echo, Cardiac Doppler, Color Doppler and Intracardiac            Opacification Agent (Both Spectral and Color Flow Doppler were            utilized during procedure). Indications:    CHF  History:        Patient has prior history of Echocardiogram examinations, most                 recent 12/07/2023.                 Aortic Valve: 29 mm native pulmonic valve is present in the                 aortic position.  Sonographer:    Hersey Lorenzo RDCS Referring Phys: [phone] KENNETH C HILTY IMPRESSIONS  1. Left ventricular ejection fraction, by estimation, is 35 to 40%. Left ventricular ejection fraction by 2D MOD biplane is 39.9 %. The left ventricle has moderately decreased function. The left ventricle demonstrates regional wall motion abnormalities (see scoring diagram/findings for description). There is severe concentric left ventricular hypertrophy. Left ventricular diastolic parameters are indeterminate.  2. Right ventricular systolic function is normal. The right ventricular size is normal.  3. The mitral valve is normal in structure. No evidence of mitral valve regurgitation. No evidence of mitral stenosis.  4. The aortic valve has been repaired/replaced. Aortic valve regurgitation is not visualized. No aortic stenosis is present. There is a 29 mm native pulmonic valve present in the aortic position.  5. The inferior vena cava is normal in size with greater than 50% respiratory variability, suggesting right atrial pressure of 3 mmHg. FINDINGS  Left Ventricle: Left ventricular ejection fraction, by estimation, is 35  to 40%. Left ventricular ejection fraction by 2D MOD biplane is 39.9 %. The left ventricle has moderately decreased function. The left ventricle demonstrates regional wall motion abnormalities. The left ventricular internal cavity size was normal in size. There is severe concentric left ventricular  hypertrophy. Left ventricular diastolic parameters are indeterminate.  LV Wall Scoring: The entire septum and entire inferior wall are hypokinetic. The entire anterior wall, entire lateral wall, and apex are normal. Right Ventricle: The right ventricular size is normal. No increase in right ventricular wall thickness. Right ventricular systolic function is normal. Left Atrium: Left atrial size was normal in size. Right Atrium: Right atrial size was normal in size. Pericardium: There is no evidence of pericardial effusion. Mitral Valve: The mitral valve is normal in structure. No evidence of mitral valve regurgitation. No evidence of mitral valve stenosis. Tricuspid Valve: The tricuspid valve is normal in structure. Tricuspid valve regurgitation is trivial. No evidence of tricuspid stenosis. Aortic Valve: The aortic valve has been repaired/replaced. Aortic valve regurgitation is not visualized. No aortic stenosis is present. There is a 29 mm native pulmonic valve present in the aortic position. Pulmonic Valve: The pulmonic valve was normal in structure. Pulmonic valve regurgitation is not visualized. No evidence of pulmonic stenosis. Aorta: The aortic root is normal in size and structure. Venous: The inferior vena cava is normal in size with greater than 50% respiratory variability, suggesting right atrial pressure of 3 mmHg. IAS/Shunts: No atrial level shunt detected by color flow Doppler.  LEFT VENTRICLE PLAX 2D                        Biplane EF (MOD) LVIDd:         5.20 cm         LV Biplane EF:   Left LVIDs:         4.60 cm                          ventricular LV PW:         1.80 cm                          ejection LV IVS:        1.80 cm                          fraction by LVOT diam:     2.00 cm                          2D MOD LV SV:         46                               biplane is LV SV Index:   20                               39.9 %. LVOT Area:     3.14 cm                                Diastology  LV e' medial:    4.05 cm/s LV Volumes (MOD)               LV E/e' medial:  32.6 LV vol d, MOD    111.0 ml      LV e' lateral:   10.20 cm/s A2C:                           LV E/e' lateral: 12.9 LV vol d, MOD    117.0 ml A4C: LV vol s, MOD    59.7 ml A2C: LV vol s, MOD    80.7 ml A4C: LV SV MOD A2C:   51.3 ml LV SV MOD A4C:   117.0 ml LV SV MOD BP:    47.2 ml RIGHT VENTRICLE         IVC TAPSE (M-mode): 0.7 cm  IVC diam: 1.80 cm LEFT ATRIUM           Index LA diam:      4.50 cm 1.95 cm/m LA Vol (A4C): 51.2 ml 22.16 ml/m  AORTIC VALVE LVOT Vmax:   74.20 cm/s LVOT Vmean:  54.300 cm/s LVOT VTI:    0.146 m  AORTA Ao Root diam: 3.60 cm Ao Asc diam:  3.80 cm MITRAL VALVE MV Area (PHT): 3.65 cm     SHUNTS MV Decel Time: 208 msec     Systemic VTI:  0.15 m MV E velocity: 132.00 cm/s  Systemic Diam: 2.00 cm MV A velocity: 33.00 cm/s MV E/A ratio:  4.00 Maudine Sos MD Electronically signed by Maudine Sos MD Signature Date/Time: 04/11/2024/2:19:04 PM    Final    NM Pulmonary Perfusion Result Date: 04/10/2024 CLINICAL DATA:  Pulmonary embolism, syncope concern for acute pulmonary embolism. EXAM: NUCLEAR MEDICINE PERFUSION LUNG SCAN TECHNIQUE: Perfusion images were obtained in multiple projections after intravenous injection of radiopharmaceutical. RADIOPHARMACEUTICALS:  4.4 mCi Tc-45m MAA COMPARISON:  Radiograph 04/09/2024 FINDINGS: There is a wedge-shaped peripheral perfusion defect in the posterior RIGHT lower lobe. Generalized poor perfusion to the LEFT midlung may relate to pleural effusion. IMPRESSION: Concern for RIGHT lower lobe pulmonary embolism.  High probability. These results will be called to the ordering clinician or representative by the Radiologist Assistant, and communication documented in the PACS or [employer]. Electronically Signed   By: Deboraha Fallow M.D.   On: 04/10/2024 17:51    Cardiac Studies   Echo 04/11/24   1. Left ventricular ejection fraction, by  estimation, is 35 to 40%. Left  ventricular ejection fraction by 2D MOD biplane is 39.9 %. The left  ventricle has moderately decreased function. The left ventricle  demonstrates regional wall motion abnormalities  (see scoring diagram/findings for description). There is severe concentric  left ventricular hypertrophy. Left ventricular diastolic parameters are  indeterminate.   2. Right ventricular systolic function is normal. The right ventricular  size is normal.   3. The mitral valve is normal in structure. No evidence of mitral valve  regurgitation. No evidence of mitral stenosis.   4. The aortic valve has been repaired/replaced. Aortic valve  regurgitation is not visualized. No aortic stenosis is present. There is a  29 mm native pulmonic valve present in the aortic position.   5. The inferior vena cava is normal in size with greater than 50%  respiratory variability, suggesting right atrial pressure of 3 mmHg.   Patient Profile     83 y.o. male presented to [facility] with  1 to 2 day  hx of fatigue, weakness, near syncope   Assessment & Plan    1  Weakness, fatigue  Pt hypotensive on arrival  BUN/CR elevated from baseline   Received IV fluids      V/Q scan high prob for PE in RLL     OVerall cause for problems appear multifactorial  2  HFrEF   Echo yesterday LVEf is relatively unchanged    RVEF is OK When he came in he was very dry   Breathing was better     BUN/CR have improved significantly    I agree with lasix  today as noted for 2 doses    Further meds for HFrEF have been limited due to low BP and renal insufficency   Follow for now    2  PE  Plan for anticoagulation  Pt currently on O2   Ackerman  3   Jx CAD   CABG in 2021   Pt has had triv trop elevation   Flat   Probably from PE      5  Hx TAVR [facility], Sept [facility]))  Mean gradient 8 mm Hg   No AI  6  Hx Atrial fib    Pt has been maintaining SR  7   SSS   Pt s/p leadless pacemaker  8  OSA  Pt on CPAP  9  Pulmonary fibrosis   Fine rales on exam  10 CKD     Cr 1.6  today   Baseline was 1.34 in Jan   (was 2.44 on admit)  11 Hx DM  11   PVOD   s/p amputaions   For questions or updates, please contact [facility] Please consult [email or web address] for contact info under        Signed, Ola Berger, MD  04/12/2024, 7:01 AM

## 2024-04-12 NOTE — Progress Notes (Signed)
   04/12/24 2234  BiPAP/CPAP/SIPAP  $ Non-Invasive Home Ventilator  Subsequent  BiPAP/CPAP/SIPAP Resmed  Patient Home Machine Yes  Safety Check Completed by RT for Home Unit Yes, no issues noted  Patient Home Mask Yes  Patient Home Tubing Yes  Device Plugged into RED Power Outlet Yes  BiPAP/CPAP /SiPAP Vitals  Pulse Rate 92  Resp 20  SpO2 98 %  Bilateral Breath Sounds Diminished;Rhonchi  MEWS Score/Color  MEWS Score 0  MEWS Score Color Shane [name]

## 2024-04-12 NOTE — Telephone Encounter (Signed)
 Patient Product/process development scientist completed.    The patient is insured through [employer]. Patient has Medicare and is not eligible for a copay card, but may be able to apply for patient assistance or Medicare RX Payment Plan (Patient Must reach out to their plan, if eligible for payment plan), if available.    Ran test claim for Eliquis  5 mg and the current 30 day co-pay is $270.15 due to a deductible.   This test claim was processed through [facility]- copay amounts may vary at other pharmacies due to pharmacy/plan contracts, or as the patient moves through the different stages of their insurance plan.     Morgan Arab, CPHT Pharmacy Technician III Certified Patient Advocate [facility] Patient Advocate Team Direct Number: [phone]  Fax: [phone]

## 2024-04-12 NOTE — Progress Notes (Signed)
 Heart Failure Navigator Progress Note  Assessed for Heart & Vascular TOC clinic readiness.  Patient does not meet criteria due to follows with [facility].   Navigator available for reassessment of patient.   Jerilyn Monte, PharmD, BCPS Heart Failure Stewardship Pharmacist Phone [phone]

## 2024-04-12 NOTE — Progress Notes (Signed)
 Patient continues to have [facility] and ongoing coughing that he says is interferes with his breathing despite medications given.

## 2024-04-12 NOTE — Progress Notes (Signed)
 Inpatient Rehab Admissions Coordinator:   Per therapy evaluations pt was screened for CIR by Loye Rumble, PT, DPT.  Evaluation this AM limited by SOB and increased WOB.  Will rescreen Monday for progress.    Loye Rumble, PT, DPT Admissions Coordinator [phone] 04/12/24  9:39 AM

## 2024-04-12 NOTE — Care Management Important Message (Signed)
 Important Message  Patient Details  Name: Shane [name] MRN: [id number] Date of Birth: [birth year]   Important Message Given:  Yes - Medicare IM     Felix Host 04/12/2024, 2:40 PM

## 2024-04-12 NOTE — Progress Notes (Signed)
 PROGRESS NOTE    [name]  WGN:[id number] DOB: [birth year] DOA: 04/09/2024 PCP: Suan Elm, MD  83/M with CAD, CABG, chronic diastolic CHF, A-fib, sick sinus syndrome with PPM, OSA, pulmonary fibrosis, PAD, right BKA, left TMA with small wounds, TAVR in 9/24 was hospitalized in 1/25 with prolonged respiratory failure related to CHF and COVID-pneumonia, required CRRT. - Brought to [facility] ED 5/6 following syncopal episode while at his rental property, he did notice lightheadedness and dizziness prior to and felt generally weaker yesterday.  EMS noted hypotension, given IV fluids, ED blood pressure was 95/54, creatinine up to 2.4, mag 1.6, hemoglobin 11.5, chest x-ray with mild pulmonary vascular congestion, lactic acid 1.1   Subjective: - Overnight with shortness of breath, orthopnea, cough  Assessment and Plan:  Syncope and collapse Hypotension -Likely multifactorial from hypotension, dehydration, acute PE -Pacer interrogation noted normal function -Lisinopril  discontinued, metoprolol  held, -VQ scan positive for right lower lobe pulmonary embolism - Echo with EF 35-40%, normal RV, wall motion abnormalities, severe LVH  Acute PE - Continue Eliquis , RV normal on echo  Acute kidney injury superimposed on stage 3a chronic kidney disease - Hemodynamically mediated from hypotension -improving - Hold lisinopril  - Renal ultrasound without hydronephrosis  Acute on chronic systolic CHF - Echo with EF 35-40%, severe LVH, normal RV - Remains overloaded, Lasix  40 Mg X2 doses today - GDMT limited by hypotension/AKI - BMP in the a.m.  Diabetic foot ulcer (HCC) -Anterior wound superficial without any signs or symptoms of infection, left foot wound is deeper but without signs or symptoms of secondary infection, continue routine wound care - Follow-up with Dr. Julio Ohm  Type 2 diabetes mellitus without complication, with long-term current use of insulin  (HCC) -CBGs are stable, Tresiba  on  hold, A1c previous 1 was 8.5.  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  Per wife pt's physician uses doppler device to check his pulses in his feet. - Followed by Dr. Julio Ohm  Adrenal insufficiency [facility]) -History of adrenal insufficiency, random cortisol yesterday was acceptable at 8.5  Coronary artery disease involving native heart without angina pectoris/ S/P CABG x3 Stable, continue statin, holding metoprolol   OSA on CPAP CPAP continue per home settings.  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HCC) At baseline patient is set to have IPF and is to use as needed oxygen during the day and CPAP at night.  DVT prophylaxis: Add Lovenox  Code Status: Discussed CODE STATUS with the patient, DNR Family Communication: Spouse at bedside Disposition Plan: Home tomorrow f stable  Consultants:    Procedures:   Antimicrobials:    Objective: Vitals:   04/11/24 2350 04/12/24 0438 04/12/24 0822 04/12/24 1218  BP:  125/66 (!) 118/58 (!) 112/56  Pulse: 71 89  75  Resp: 18 (!) 23  (!) 21  Temp:  98.7 F (37.1 C) 98.5 F (36.9 C) 99.1 F (37.3 C)  TempSrc:  Oral Oral Oral  SpO2: 100% 100%  100%  Weight:  106.2 kg    Height:        Intake/Output Summary (Last 24 hours) at 04/12/2024 1254 Last data filed at 04/12/2024 1142 Gross per 24 hour  Intake --  Output 3255 ml  Net -3255 ml   Filed Weights   04/10/24 0601 04/11/24 0420 04/12/24 0438  Weight: 108.4 kg 109.8 kg 106.2 kg    Examination:  General exam: Appears calm and comfortable  HEENT: Pos JVD Respiratory system: Significant bibasilar rales Cardiovascular system: S1 &  S2 heard, RRR.  Abd: nondistended, soft and nontender.Normal bowel sounds heard. Central nervous system: Alert and oriented. No focal neurological deficits. Extremities: BKA, transmetatarsal amputation with 2 wounds Skin: As above Psychiatry:  Mood & affect appropriate.     Data  Reviewed:   CBC: Recent Labs  Lab 04/09/24 1528 04/09/24 2338 04/10/24 0905 04/11/24 0220 04/12/24 0407  WBC 6.2  --  5.1 5.2 5.7  NEUTROABS 4.0  --   --   --   --   HGB 11.5* 11.2* 10.4* 10.5* 11.5*  HCT 35.6* 34.7* 31.4* 31.3* 34.7*  MCV 94.2  --  90.8 90.5 90.4  PLT 161  --  134* 135* 137*   Basic Metabolic Panel: Recent Labs  Lab 04/09/24 1528 04/10/24 0905 04/11/24 0220 04/12/24 0407  NA 136 134* 134* 134*  K 4.6 5.0 4.5 4.3  CL 99 99 100 97*  CO2 22 25 23 26   GLUCOSE 86 135* 90 103*  BUN 84* 85* 67* 53*  CREATININE 2.44* 2.66* 1.90* 1.59*  CALCIUM  9.0 8.6* 8.5* 8.9  MG 1.6*  --   --   --    GFR: Estimated Creatinine Clearance: 45.1 mL/min (A) (by C-G formula based on SCr of 1.59 mg/dL (H)). Liver Function Tests: Recent Labs  Lab 04/09/24 1528 04/10/24 0905 04/11/24 0220  AST 19 17 18   ALT 16 14 15   ALKPHOS 91 83 84  BILITOT 0.8 0.6 0.7  PROT 7.2 6.4* 6.6  ALBUMIN  3.2* 2.9* 2.9*   No results for input(s): "LIPASE", "AMYLASE" in the last 168 hours. No results for input(s): "AMMONIA" in the last 168 hours. Coagulation Profile: No results for input(s): "INR", "PROTIME" in the last 168 hours. Cardiac Enzymes: Recent Labs  Lab 04/09/24 2338  CKTOTAL 71   BNP (last 3 results) No results for input(s): "PROBNP" in the last 8760 hours. HbA1C: Recent Labs    04/09/24 2338  HGBA1C 8.7*   CBG: Recent Labs  Lab 04/11/24 1719 04/11/24 2127 04/12/24 0246 04/12/24 0825 04/12/24 1221  GLUCAP 149* 106* 112* 130* 131*   Lipid Profile: No results for input(s): "CHOL", "HDL", "LDLCALC", "TRIG", "CHOLHDL", "LDLDIRECT" in the last 72 hours. Thyroid Function Tests: No results for input(s): "TSH", "T4TOTAL", "FREET4", "T3FREE", "THYROIDAB" in the last 72 hours. Anemia Panel: No results for input(s): "VITAMINB12", "FOLATE", "FERRITIN", "TIBC", "IRON ", "RETICCTPCT" in the last 72 hours. Urine analysis:    Component Value Date/Time   COLORURINE YELLOW  04/10/2024 0535   APPEARANCEUR CLEAR 04/10/2024 0535   LABSPEC 1.025 04/10/2024 0535   PHURINE 5.5 04/10/2024 0535   GLUCOSEU NEGATIVE 04/10/2024 0535   HGBUR NEGATIVE 04/10/2024 0535   BILIRUBINUR NEGATIVE 04/10/2024 0535   KETONESUR NEGATIVE 04/10/2024 0535   PROTEINUR NEGATIVE 04/10/2024 0535   UROBILINOGEN 1.0 09/27/2011 1112   NITRITE NEGATIVE 04/10/2024 0535   LEUKOCYTESUR NEGATIVE 04/10/2024 0535   Sepsis Labs: @LABRCNTIP (procalcitonin:4,lacticidven:4)  ) Recent Results (from the past 240 hours)  Culture, blood (Routine X 2) w Reflex to ID Panel     Status: None (Preliminary result)   Collection Time: 04/09/24  8:49 PM   Specimen: BLOOD  Result Value Ref Range Status   Specimen Description BLOOD BLOOD LEFT HAND  Final   Special Requests   Final    BOTTLES DRAWN AEROBIC ONLY Blood Culture adequate volume   Culture   Final    NO GROWTH 2 DAYS Performed at [facility], [address], [place], [place] [address]    Report Status PENDING  Incomplete  Culture, blood (Routine X 2) w Reflex to ID Panel     Status: None (Preliminary result)   Collection Time: 04/09/24 11:40 PM   Specimen: BLOOD RIGHT HAND  Result Value Ref Range Status   Specimen Description BLOOD RIGHT HAND  Final   Special Requests   Final    BOTTLES DRAWN AEROBIC AND ANAEROBIC Blood Culture adequate volume   Culture   Final    NO GROWTH 2 DAYS Performed at [facility], [address] [place], [place] [address]    Report Status PENDING  Incomplete     Radiology Studies: DG Chest Port 1 View Result Date: 04/11/2024 CLINICAL DATA:  Acute respiratory distress EXAM: PORTABLE CHEST 1 VIEW COMPARISON:  Chest radiograph 04/09/2024 FINDINGS: Stable cardiomediastinal silhouette. TAVR. Leadless pacer. Sternotomy. Hazy airspace and interstitial opacities bilaterally greatest in the left mid and lower lung. Question small bilateral pleural effusions. No pneumothorax. IMPRESSION: Pulmonary edema with  possible small bilateral pleural effusions. Diffuse pneumonia could appear similarly. Electronically Signed   By: Rozell Cornet M.D.   On: 04/11/2024 21:16   ECHOCARDIOGRAM COMPLETE Result Date: 04/11/2024    ECHOCARDIOGRAM REPORT   Patient Name:   Shane [name] Date of Exam: 04/11/2024 Medical Rec #:  [id number]        Height:       72.0 in Accession #:    [id number]       Weight:       242.0 lb Date of Birth:  11-Dec-1940        BSA:          2.310 m Patient Age:    82 years         BP:           126/72 mmHg Patient Gender: M                HR:           63 bpm. Exam Location:  Inpatient Procedure: 2D Echo, Cardiac Doppler, Color Doppler and Intracardiac            Opacification Agent (Both Spectral and Color Flow Doppler were            utilized during procedure). Indications:    CHF  History:        Patient has prior history of Echocardiogram examinations, most                 recent 12/07/2023.                 Aortic Valve: 29 mm native pulmonic valve is present in the                 aortic position.  Sonographer:    Hersey Lorenzo RDCS Referring Phys: [phone] KENNETH C HILTY IMPRESSIONS  1. Left ventricular ejection fraction, by estimation, is 35 to 40%. Left ventricular ejection fraction by 2D MOD biplane is 39.9 %. The left ventricle has moderately decreased function. The left ventricle demonstrates regional wall motion abnormalities (see scoring diagram/findings for description). There is severe concentric left ventricular hypertrophy. Left ventricular diastolic parameters are indeterminate.  2. Right ventricular systolic function is normal. The right ventricular size is normal.  3. The mitral valve is normal in structure. No evidence of mitral valve regurgitation. No evidence of mitral stenosis.  4. The aortic valve has been repaired/replaced. Aortic valve regurgitation is not visualized. No aortic stenosis is present. There is a 29 mm  native pulmonic valve present in the aortic position.  5. The inferior  vena cava is normal in size with greater than 50% respiratory variability, suggesting right atrial pressure of 3 mmHg. FINDINGS  Left Ventricle: Left ventricular ejection fraction, by estimation, is 35 to 40%. Left ventricular ejection fraction by 2D MOD biplane is 39.9 %. The left ventricle has moderately decreased function. The left ventricle demonstrates regional wall motion abnormalities. The left ventricular internal cavity size was normal in size. There is severe concentric left ventricular hypertrophy. Left ventricular diastolic parameters are indeterminate.  LV Wall Scoring: The entire septum and entire inferior wall are hypokinetic. The entire anterior wall, entire lateral wall, and apex are normal. Right Ventricle: The right ventricular size is normal. No increase in right ventricular wall thickness. Right ventricular systolic function is normal. Left Atrium: Left atrial size was normal in size. Right Atrium: Right atrial size was normal in size. Pericardium: There is no evidence of pericardial effusion. Mitral Valve: The mitral valve is normal in structure. No evidence of mitral valve regurgitation. No evidence of mitral valve stenosis. Tricuspid Valve: The tricuspid valve is normal in structure. Tricuspid valve regurgitation is trivial. No evidence of tricuspid stenosis. Aortic Valve: The aortic valve has been repaired/replaced. Aortic valve regurgitation is not visualized. No aortic stenosis is present. There is a 29 mm native pulmonic valve present in the aortic position. Pulmonic Valve: The pulmonic valve was normal in structure. Pulmonic valve regurgitation is not visualized. No evidence of pulmonic stenosis. Aorta: The aortic root is normal in size and structure. Venous: The inferior vena cava is normal in size with greater than 50% respiratory variability, suggesting right atrial pressure of 3 mmHg. IAS/Shunts: No atrial level shunt detected by color flow Doppler.  LEFT VENTRICLE PLAX 2D                         Biplane EF (MOD) LVIDd:         5.20 cm         LV Biplane EF:   Left LVIDs:         4.60 cm                          ventricular LV PW:         1.80 cm                          ejection LV IVS:        1.80 cm                          fraction by LVOT diam:     2.00 cm                          2D MOD LV SV:         46                               biplane is LV SV Index:   20                               39.9 %. LVOT Area:     3.14 cm  Diastology                                LV e' medial:    4.05 cm/s LV Volumes (MOD)               LV E/e' medial:  32.6 LV vol d, MOD    111.0 ml      LV e' lateral:   10.20 cm/s A2C:                           LV E/e' lateral: 12.9 LV vol d, MOD    117.0 ml A4C: LV vol s, MOD    59.7 ml A2C: LV vol s, MOD    80.7 ml A4C: LV SV MOD A2C:   51.3 ml LV SV MOD A4C:   117.0 ml LV SV MOD BP:    47.2 ml RIGHT VENTRICLE         IVC TAPSE (M-mode): 0.7 cm  IVC diam: 1.80 cm LEFT ATRIUM           Index LA diam:      4.50 cm 1.95 cm/m LA Vol (A4C): 51.2 ml 22.16 ml/m  AORTIC VALVE LVOT Vmax:   74.20 cm/s LVOT Vmean:  54.300 cm/s LVOT VTI:    0.146 m  AORTA Ao Root diam: 3.60 cm Ao Asc diam:  3.80 cm MITRAL VALVE MV Area (PHT): 3.65 cm     SHUNTS MV Decel Time: 208 msec     Systemic VTI:  0.15 m MV E velocity: 132.00 cm/s  Systemic Diam: 2.00 cm MV A velocity: 33.00 cm/s MV E/A ratio:  4.00 Maudine Sos MD Electronically signed by Maudine Sos MD Signature Date/Time: 04/11/2024/2:19:04 PM    Final    NM Pulmonary Perfusion Result Date: 04/10/2024 CLINICAL DATA:  Pulmonary embolism, syncope concern for acute pulmonary embolism. EXAM: NUCLEAR MEDICINE PERFUSION LUNG SCAN TECHNIQUE: Perfusion images were obtained in multiple projections after intravenous injection of radiopharmaceutical. RADIOPHARMACEUTICALS:  4.4 mCi Tc-54m MAA COMPARISON:  Radiograph 04/09/2024 FINDINGS: There is a wedge-shaped peripheral perfusion defect in the posterior  RIGHT lower lobe. Generalized poor perfusion to the LEFT midlung may relate to pleural effusion. IMPRESSION: Concern for RIGHT lower lobe pulmonary embolism.  High probability. These results will be called to the ordering clinician or representative by the Radiologist Assistant, and communication documented in the PACS or [employer]. Electronically Signed   By: Deboraha Fallow M.D.   On: 04/10/2024 17:51     Scheduled Meds:  apixaban   10 mg Oral BID   Followed by   Cecily Cohen ON 04/19/2024] apixaban   5 mg Oral BID   atorvastatin   80 mg Oral QHS   benzonatate   100 mg Oral BID   escitalopram   10 mg Oral q AM   furosemide   40 mg Intravenous BID   insulin  aspart  0-15 Units Subcutaneous TID WC   leptospermum manuka honey  1 Application Topical Daily   pantoprazole   40 mg Oral BID   sodium chloride  flush  3 mL Intravenous Q12H   Continuous Infusions:   LOS: 3 days    Time spent:    Deforest Fast, MD [facility]   04/12/2024, 12:54 PM

## 2024-04-12 NOTE — Discharge Instructions (Signed)
 Information on my medicine - ELIQUIS  (apixaban )  This medication education was reviewed with me or my healthcare representative as part of my discharge preparation.  The pharmacist that spoke with me during my hospital stay was:  Sheron Dixons, [facility]  Why was Eliquis  prescribed for you? Eliquis  was prescribed to treat blood clots that may have been found in the veins of your legs (deep vein thrombosis) or in your lungs (pulmonary embolism) and to reduce the risk of them occurring again.  What do You need to know about Eliquis  ? The starting dose is 10 mg (two 5 mg tablets) taken TWICE daily for the FIRST SEVEN (7) DAYS, then on (enter date)  04/19/24  the dose is reduced to ONE 5 mg tablet taken TWICE daily.  Eliquis  may be taken with or without food.   Try to take the dose about the same time in the morning and in the evening. If you have difficulty swallowing the tablet whole please discuss with your pharmacist how to take the medication safely.  Take Eliquis  exactly as prescribed and DO NOT stop taking Eliquis  without talking to the doctor who prescribed the medication.  Stopping may increase your risk of developing a new blood clot.  Refill your prescription before you run out.  After discharge, you should have regular check-up appointments with your healthcare provider that is prescribing your Eliquis .    What do you do if you miss a dose? If a dose of ELIQUIS  is not taken at the scheduled time, take it as soon as possible on the same day and twice-daily administration should be resumed. The dose should not be doubled to make up for a missed dose.  Important Safety Information A possible side effect of Eliquis  is bleeding. You should call your healthcare provider right away if you experience any of the following: Bleeding from an injury or your nose that does not stop. Unusual colored urine (red or dark brown) or unusual colored stools (red or black). Unusual bruising for  unknown reasons. A serious fall or if you hit your head (even if there is no bleeding).  Some medicines may interact with Eliquis  and might increase your risk of bleeding or clotting while on Eliquis . To help avoid this, consult your healthcare provider or pharmacist prior to using any new prescription or non-prescription medications, including herbals, vitamins, non-steroidal anti-inflammatory drugs (NSAIDs) and supplements.  This website has more information on Eliquis  (apixaban ): [email or web address] [name]

## 2024-04-13 DIAGNOSIS — R55 Syncope and collapse: Secondary | ICD-10-CM | POA: Diagnosis not present

## 2024-04-13 LAB — CBC
HCT: 33.5 % — ABNORMAL LOW (ref 39.0–52.0)
Hemoglobin: 11.1 g/dL — ABNORMAL LOW (ref 13.0–17.0)
MCH: 30 pg (ref 26.0–34.0)
MCHC: 33.1 g/dL (ref 30.0–36.0)
MCV: 90.5 fL (ref 80.0–100.0)
Platelets: 133 10*3/uL — ABNORMAL LOW (ref 150–400)
RBC: 3.7 MIL/uL — ABNORMAL LOW (ref 4.22–5.81)
RDW: 13.8 % (ref 11.5–15.5)
WBC: 4.7 10*3/uL (ref 4.0–10.5)
nRBC: 0 % (ref 0.0–0.2)

## 2024-04-13 LAB — BASIC METABOLIC PANEL WITH GFR
Anion gap: 11 (ref 5–15)
BUN: 49 mg/dL — ABNORMAL HIGH (ref 8–23)
CO2: 30 mmol/L (ref 22–32)
Calcium: 8.8 mg/dL — ABNORMAL LOW (ref 8.9–10.3)
Chloride: 92 mmol/L — ABNORMAL LOW (ref 98–111)
Creatinine, Ser: 1.8 mg/dL — ABNORMAL HIGH (ref 0.61–1.24)
GFR, Estimated: 37 mL/min — ABNORMAL LOW (ref >60)
Glucose, Bld: 130 mg/dL — ABNORMAL HIGH (ref 70–99)
Potassium: 4.4 mmol/L (ref 3.5–5.1)
Sodium: 133 mmol/L — ABNORMAL LOW (ref 135–145)

## 2024-04-13 LAB — GLUCOSE, CAPILLARY
Glucose-Capillary: 125 mg/dL — ABNORMAL HIGH (ref 70–99)
Glucose-Capillary: 125 mg/dL — ABNORMAL HIGH (ref 70–99)
Glucose-Capillary: 185 mg/dL — ABNORMAL HIGH (ref 70–99)
Glucose-Capillary: 259 mg/dL — ABNORMAL HIGH (ref 70–99)
Glucose-Capillary: 306 mg/dL — ABNORMAL HIGH (ref 70–99)
Glucose-Capillary: 286 mg/dL — ABNORMAL HIGH (ref 70–99)

## 2024-04-13 MED ORDER — INSULIN ASPART 100 UNIT/ML IJ SOLN
0.0000 [IU] | Freq: Three times a day (TID) | INTRAMUSCULAR | Status: DC
  Administered 2024-04-14: 11 [IU] via SUBCUTANEOUS

## 2024-04-13 MED ORDER — METHYLPREDNISOLONE SODIUM SUCC 40 MG IJ SOLR
40.0000 mg | Freq: Two times a day (BID) | INTRAMUSCULAR | Status: AC
  Administered 2024-04-13 (×2): 40 mg via INTRAVENOUS
  Filled 2024-04-13 (×2): qty 1

## 2024-04-13 MED ORDER — INSULIN ASPART 100 UNIT/ML IJ SOLN
0.0000 [IU] | Freq: Every day | INTRAMUSCULAR | Status: DC
  Administered 2024-04-13: 3 [IU] via SUBCUTANEOUS

## 2024-04-13 MED ORDER — ORAL CARE MOUTH RINSE: 15.0000 mL | OROMUCOSAL | Status: DC | PRN

## 2024-04-13 MED ORDER — FUROSEMIDE 10 MG/ML IJ SOLN
40.0000 mg | Freq: Once | INTRAMUSCULAR | Status: AC
  Administered 2024-04-13: 40 mg via INTRAVENOUS
  Filled 2024-04-13: qty 4

## 2024-04-13 NOTE — Progress Notes (Signed)
   04/13/24 2220  BiPAP/CPAP/SIPAP  $ Non-Invasive Home Ventilator  Subsequent  BiPAP/CPAP/SIPAP Resmed  Patient Home Machine Yes  Safety Check Completed by RT for Home Unit Yes, no issues noted  Patient Home Mask Yes  Patient Home Tubing Yes  Device Plugged into RED Power Outlet Yes  BiPAP/CPAP /SiPAP Vitals  Bilateral Breath Sounds Diminished

## 2024-04-13 NOTE — Plan of Care (Signed)
   Problem: Health Behavior/Discharge Planning: Goal: Ability to manage health-related needs will improve Outcome: Progressing

## 2024-04-13 NOTE — Progress Notes (Signed)
 PROGRESS NOTE    Shane [name]  UEA:[id number] DOB: 04/05/1941 DOA: 04/09/2024 PCP: Suan Elm, MD  82/M with CAD, CABG, chronic diastolic CHF, A-fib, sick sinus syndrome with PPM, OSA, pulmonary fibrosis, PAD, right BKA, left TMA with small wounds, TAVR in 9/24 was hospitalized in 1/25 with prolonged respiratory failure related to CHF and COVID-pneumonia, required CRRT. - Brought to [facility] ED 5/6 following syncopal episode while at his rental property, he did notice lightheadedness and dizziness prior to and felt generally weaker yesterday.  EMS noted hypotension, given IV fluids, ED blood pressure was 95/54, creatinine up to 2.4, mag 1.6, hemoglobin 11.5, chest x-ray with mild pulmonary vascular congestion, lactic acid 1.1   Subjective: - Feels better, breathing a little easier, still has considerable cough  Assessment and Plan:  Syncope and collapse Hypotension -Likely multifactorial from hypotension, dehydration, acute PE -Pacer interrogation noted normal function -Lisinopril  discontinued, metoprolol  held, -VQ scan positive for right lower lobe pulmonary embolism - Echo with EF 35-40%, normal RV, wall motion abnormalities, severe LVH  Acute PE - Continue Eliquis , RV normal on echo  Acute kidney injury superimposed on stage 3a chronic kidney disease - Hemodynamically mediated from hypotension -improving, almost back to baseline - Hold lisinopril  - Renal ultrasound without hydronephrosis  Acute on chronic systolic CHF - Echo with EF 35-40%, severe LVH, normal RV - Volume status improving, repeat Lasix  X1 today -Add low-dose Aldactone tomorrow if BP, creatinine tolerates - GDMT limited by hypotension/AKI - BMP in the a.m.  Viral URI, reactive airway disease Positive for metapneumovirus, low-grade fevers yesterday - Supportive care, add IV steroids today, quick prednisone taper from tomorrow  Diabetic foot ulcer (HCC) -Anterior wound superficial without any signs or  symptoms of infection, left foot wound is deeper but without signs or symptoms of secondary infection, continue routine wound care - Follow-up with Dr. Julio Ohm  Type 2 diabetes mellitus without complication, with long-term current use of insulin  (HCC) -CBGs are stable, Tresiba  on hold, A1c previous 1 was 8.5.  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  Per wife pt's physician uses doppler device to check his pulses in his feet. - Followed by Dr. Julio Ohm  Adrenal insufficiency [facility]) -History of adrenal insufficiency, random cortisol yesterday was acceptable at 8.5  Coronary artery disease involving native heart without angina pectoris/ S/P CABG x3 Stable, continue statin, holding metoprolol   OSA on CPAP CPAP continue per home settings.  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HCC) -uses  as needed oxygen during the day  - Followed by Dr. Bertrum Brodie  DVT prophylaxis: Lovenox  Code Status: Discussed CODE STATUS with the patient, DNR Family Communication: Spouse at bedside Disposition Plan: Home tomorrow f stable  Consultants:    Procedures:   Antimicrobials:    Objective: Vitals:   04/12/24 2234 04/12/24 2355 04/13/24 0421 04/13/24 0741  BP:  (!) 119/58 118/62 (!) 139/93  Pulse: 92 67 78 72  Resp: 20 18 20 19   Temp:  98 F (36.7 C) 98.8 F (37.1 C) 98.8 F (37.1 C)  TempSrc:  Oral Oral Oral  SpO2: 98% 97% 99% 96%  Weight:   105.3 kg   Height:        Intake/Output Summary (Last 24 hours) at 04/13/2024 1146 Last data filed at 04/13/2024 0426 Gross per 24 hour  Intake --  Output 1350 ml  Net -1350 ml   Filed Weights   04/11/24 0420 04/12/24 0438 04/13/24 0421  Weight: 109.8 kg  106.2 kg 105.3 kg    Examination:  General exam: Appears calm and comfortable  HEENT: Pos JVD Respiratory system: Bibasilar Rales, few wheezes Cardiovascular system: S1 & S2 heard, RRR.  Abd: nondistended, soft and  nontender.Normal bowel sounds heard. Central nervous system: Alert and oriented. No focal neurological deficits. Extremities: BKA, transmetatarsal amputation with 2 wounds Skin: As above Psychiatry:  Mood & affect appropriate.     Data Reviewed:   CBC: Recent Labs  Lab 04/09/24 1528 04/09/24 2338 04/10/24 0905 04/11/24 0220 04/12/24 0407 04/13/24 0505  WBC 6.2  --  5.1 5.2 5.7 4.7  NEUTROABS 4.0  --   --   --   --   --   HGB 11.5* 11.2* 10.4* 10.5* 11.5* 11.1*  HCT 35.6* 34.7* 31.4* 31.3* 34.7* 33.5*  MCV 94.2  --  90.8 90.5 90.4 90.5  PLT 161  --  134* 135* 137* 133*   Basic Metabolic Panel: Recent Labs  Lab 04/09/24 1528 04/10/24 0905 04/11/24 0220 04/12/24 0407 04/13/24 0505  NA 136 134* 134* 134* 133*  K 4.6 5.0 4.5 4.3 4.4  CL 99 99 100 97* 92*  CO2 22 25 23 26 30   GLUCOSE 86 135* 90 103* 130*  BUN 84* 85* 67* 53* 49*  CREATININE 2.44* 2.66* 1.90* 1.59* 1.80*  CALCIUM  9.0 8.6* 8.5* 8.9 8.8*  MG 1.6*  --   --   --   --    GFR: Estimated Creatinine Clearance: 39.7 mL/min (A) (by C-G formula based on SCr of 1.8 mg/dL (H)). Liver Function Tests: Recent Labs  Lab 04/09/24 1528 04/10/24 0905 04/11/24 0220  AST 19 17 18   ALT 16 14 15   ALKPHOS 91 83 84  BILITOT 0.8 0.6 0.7  PROT 7.2 6.4* 6.6  ALBUMIN  3.2* 2.9* 2.9*   No results for input(s): "LIPASE", "AMYLASE" in the last 168 hours. No results for input(s): "AMMONIA" in the last 168 hours. Coagulation Profile: No results for input(s): "INR", "PROTIME" in the last 168 hours. Cardiac Enzymes: Recent Labs  Lab 04/09/24 2338  CKTOTAL 71   BNP (last 3 results) No results for input(s): "PROBNP" in the last 8760 hours. HbA1C: No results for input(s): "HGBA1C" in the last 72 hours.  CBG: Recent Labs  Lab 04/12/24 1221 04/12/24 1625 04/12/24 2137 04/13/24 0425 04/13/24 0826  GLUCAP 131* 132* 136* 125* 125*   Lipid Profile: No results for input(s): "CHOL", "HDL", "LDLCALC", "TRIG", "CHOLHDL",  "LDLDIRECT" in the last 72 hours. Thyroid Function Tests: No results for input(s): "TSH", "T4TOTAL", "FREET4", "T3FREE", "THYROIDAB" in the last 72 hours. Anemia Panel: No results for input(s): "VITAMINB12", "FOLATE", "FERRITIN", "TIBC", "IRON ", "RETICCTPCT" in the last 72 hours. Urine analysis:    Component Value Date/Time   COLORURINE YELLOW 04/10/2024 0535   APPEARANCEUR CLEAR 04/10/2024 0535   LABSPEC 1.025 04/10/2024 0535   PHURINE 5.5 04/10/2024 0535   GLUCOSEU NEGATIVE 04/10/2024 0535   HGBUR NEGATIVE 04/10/2024 0535   BILIRUBINUR NEGATIVE 04/10/2024 0535   KETONESUR NEGATIVE 04/10/2024 0535   PROTEINUR NEGATIVE 04/10/2024 0535   UROBILINOGEN 1.0 09/27/2011 1112   NITRITE NEGATIVE 04/10/2024 0535   LEUKOCYTESUR NEGATIVE 04/10/2024 0535   Sepsis Labs: @LABRCNTIP (procalcitonin:4,lacticidven:4)  ) Recent Results (from the past 240 hours)  Culture, blood (Routine X 2) w Reflex to ID Panel     Status: None (Preliminary result)   Collection Time: 04/09/24  8:49 PM   Specimen: BLOOD  Result Value Ref Range Status   Specimen Description BLOOD BLOOD LEFT HAND  Final   Special Requests   Final    BOTTLES DRAWN AEROBIC ONLY Blood Culture adequate volume   Culture   Final    NO GROWTH 3 DAYS Performed at [facility], [address] [place], [place] [address]    Report Status PENDING  Incomplete  Culture, blood (Routine X 2) w Reflex to ID Panel     Status: None (Preliminary result)   Collection Time: 04/09/24 11:40 PM   Specimen: BLOOD RIGHT HAND  Result Value Ref Range Status   Specimen Description BLOOD RIGHT HAND  Final   Special Requests   Final    BOTTLES DRAWN AEROBIC AND ANAEROBIC Blood Culture adequate volume   Culture   Final    NO GROWTH 3 DAYS Performed at [facility], [address] [place], [place] [address]    Report Status PENDING  Incomplete  Respiratory (~20 pathogens) panel by PCR     Status: Abnormal   Collection Time: 04/12/24  4:55 PM    Specimen: Nasopharyngeal Swab; Respiratory  Result Value Ref Range Status   Adenovirus NOT DETECTED NOT DETECTED Final   Coronavirus 229E NOT DETECTED NOT DETECTED Final    Comment: (NOTE) The Coronavirus on the Respiratory Panel, DOES NOT test for the novel  Coronavirus (2019 nCoV)    Coronavirus HKU1 NOT DETECTED NOT DETECTED Final   Coronavirus NL63 NOT DETECTED NOT DETECTED Final   Coronavirus OC43 NOT DETECTED NOT DETECTED Final   Metapneumovirus DETECTED (A) NOT DETECTED Final   Rhinovirus / Enterovirus NOT DETECTED NOT DETECTED Final   Influenza A NOT DETECTED NOT DETECTED Final   Influenza B NOT DETECTED NOT DETECTED Final   Parainfluenza Virus 1 NOT DETECTED NOT DETECTED Final   Parainfluenza Virus 2 NOT DETECTED NOT DETECTED Final   Parainfluenza Virus 3 NOT DETECTED NOT DETECTED Final   Parainfluenza Virus 4 NOT DETECTED NOT DETECTED Final   Respiratory Syncytial Virus NOT DETECTED NOT DETECTED Final   Bordetella pertussis NOT DETECTED NOT DETECTED Final   Bordetella Parapertussis NOT DETECTED NOT DETECTED Final   Chlamydophila pneumoniae NOT DETECTED NOT DETECTED Final   Mycoplasma pneumoniae NOT DETECTED NOT DETECTED Final    Comment: Performed at [facility], [address], [place], [place] [address]     Radiology Studies: DG Chest Port 1 View Result Date: 04/11/2024 CLINICAL DATA:  Acute respiratory distress EXAM: PORTABLE CHEST 1 VIEW COMPARISON:  Chest radiograph 04/09/2024 FINDINGS: Stable cardiomediastinal silhouette. TAVR. Leadless pacer. Sternotomy. Hazy airspace and interstitial opacities bilaterally greatest in the left mid and lower lung. Question small bilateral pleural effusions. No pneumothorax. IMPRESSION: Pulmonary edema with possible small bilateral pleural effusions. Diffuse pneumonia could appear similarly. Electronically Signed   By: Rozell Cornet M.D.   On: 04/11/2024 21:16     Scheduled Meds:  apixaban   10 mg Oral BID   Followed by    Cecily Cohen ON 04/19/2024] apixaban   5 mg Oral BID   atorvastatin   80 mg Oral QHS   benzonatate   200 mg Oral BID   escitalopram   10 mg Oral q AM   furosemide   40 mg Intravenous Once   insulin  aspart  0-15 Units Subcutaneous TID WC   leptospermum manuka honey  1 Application Topical Daily   methylPREDNISolone (SOLU-MEDROL) injection  40 mg Intravenous Q12H   pantoprazole   40 mg Oral BID   sodium chloride  flush  3 mL Intravenous Q12H   Continuous Infusions:   LOS: 4 days  Time spent:    Deforest Fast, MD [facility]   04/13/2024, 11:46 AM

## 2024-04-13 NOTE — Evaluation (Signed)
 Occupational Therapy Evaluation Patient Details Name: Shane [name] MRN: [id number] DOB: 1941/06/06 Today's Date: 04/13/2024   History of Present Illness   Pt is a 83 y.o. male admitted 04/09/24 following syncopal episode while at his rental property. Pt noticed lightheadedness and dizziness prior to and felt generally weaker.  EMS noted hypotension, given IV fluids, ED blood pressure was 95/54. Chest x-ray with mild pulmonary vascular congestion. VQ scan positive for right lower lobe pulmonary embolism. PMH of CAD, CABG, chronic diastolic CHF, A-fib, sick sinus syndrome with PPM, OSA, pulmonary fibrosis, PAD, right BKA, left TMA with small wounds, TAVR in 9/24, hospitalized 1/25 with prolonged respiratory failure related to CHF and COVID-pneumonia, required CRRT.     Clinical Impressions Pt presents with decline in function and safety with ADLs and ADL mobility with impaired strength, balance and endurance. Eval limited due to pt reports of nausea and dizziness once seated EOB. PTA pt lives with his wife, was Ind with ADLs, used w/c or scooter for functional mobility, pt wears a R LE prosthesis, walks short distances using RW, works [employer] secretary. He c/o nausea and dizziness with position change although BP and O2 SATs stable. Recommend intensive inpatient follow-up therapy, >3 hours/day. OT will follow acutely to maximize level of function and safety     If plan is discharge home, recommend the following:   A lot of help with bathing/dressing/bathroom;A little help with walking and/or transfers     Functional Status Assessment   Patient has had a recent decline in their functional status and demonstrates the ability to make significant improvements in function in a reasonable and predictable amount of time.     Equipment Recommendations   None recommended by OT     Recommendations for Other Services         Precautions/Restrictions   Precautions Precautions:  Fall Recall of Precautions/Restrictions: Intact Restrictions Weight Bearing Restrictions Per Provider Order: No     Mobility Bed Mobility Overal bed mobility: Needs Assistance Bed Mobility: Supine to Sit, Sit to Supine     Supine to sit: Min assist, HOB elevated, Used rails Sit to supine: Min assist   General bed mobility comments: Assist to elevate trunk. Pt scooted fwd with BUE support. Assist to bring B LE back into bed. Pt c/o mild dizziness seated EOB, VSS    Transfers                          Balance Overall balance assessment: Needs assistance Sitting-balance support: Feet supported, Bilateral upper extremity supported Sitting balance-Leahy Scale: Fair Sitting balance - Comments: CGA for balance Postural control: Posterior lean, Left lateral lean                                 ADL either performed or assessed with clinical judgement   ADL Overall ADL's : Needs assistance/impaired Eating/Feeding: Independent   Grooming: Wash/dry hands;Wash/dry face;Contact guard assist;Sitting   Upper Body Bathing: Contact guard assist;Sitting   Lower Body Bathing: Moderate assistance;Sitting/lateral leans   Upper Body Dressing : Contact guard assist;Sitting   Lower Body Dressing: Moderate assistance;Sitting/lateral leans     Toilet Transfer Details (indicate cue type and reason): deferred due to nausea, dizziness Toileting- Clothing Manipulation and Hygiene: Total assistance;Bed level               Vision Baseline Vision/History: 1 Wears glasses Ability to See  in Adequate Light: 0 Adequate Patient Visual Report: No change from baseline       Perception         Praxis         Pertinent Vitals/Pain Pain Assessment Pain Assessment: No/denies pain Pain Score: 0-No pain Pain Intervention(s): Monitored during session, Repositioned     Extremity/Trunk Assessment Upper Extremity Assessment Upper Extremity Assessment: Generalized  weakness;Right hand dominant   Lower Extremity Assessment Lower Extremity Assessment: Defer to PT evaluation       Communication Communication Communication: No apparent difficulties   Cognition Arousal: Alert Behavior During Therapy: WFL for tasks assessed/performed Cognition: No apparent impairments                               Following commands: Intact       Cueing  General Comments          Exercises     Shoulder Instructions      Home Living Family/patient expects to be discharged to:: Private residence Living Arrangements: Spouse/significant other Available Help at Discharge: Family;Available PRN/intermittently Type of Home: House Home Access: Ramped entrance     Home Layout: Two level;Able to live on main level with bedroom/bathroom     Bathroom Shower/Tub: Walk-in shower   Bathroom Toilet: Handicapped height     Home Equipment: Pharmacist, hospital (2 wheels);Electric scooter;Wheelchair - manual;Cane - single point;Other (comment);Grab bars - tub/shower;Grab bars - toilet;Adaptive equipment Adaptive Equipment: Reacher;Sock aid;Long-handled shoe horn;Long-handled sponge        Prior Functioning/Environment Prior Level of Function : Working/employed;Independent/Modified Independent             Mobility Comments: Pt reports he doesn't walk much, but can complete short distances using RW and R BKA prosthesis. Typically will transfer to w/c or scooter. 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 States he can navigate stairs with a rail. Pt reports a couple of LOB but only 1 fall that led to admission. ADLs Comments: ModI with ADLs. Wife does IADLs. He can drive, but doesn't    OT Problem List: Decreased strength;Decreased activity tolerance;Impaired balance (sitting and/or standing)   OT Treatment/Interventions: Self-care/ADL training;Patient/family education;Therapeutic exercise;Balance  training;Therapeutic activities;DME and/or AE instruction      OT Goals(Current goals can be found in the care plan section)   Acute Rehab OT Goals Patient Stated Goal: get better OT Goal Formulation: With patient/family Time For Goal Achievement: 04/27/24 Potential to Achieve Goals: Good ADL Goals Pt Will Perform Grooming: with supervision;with set-up;sitting Pt Will Perform Upper Body Bathing: with supervision;sitting Pt Will Perform Lower Body Bathing: with min assist;sitting/lateral leans Pt Will Perform Upper Body Dressing: with supervision;sitting Pt Will Transfer to Toilet: with max assist;with mod assist;stand pivot transfer;ambulating Pt Will Perform Toileting - Clothing Manipulation and hygiene: with min assist;with contact guard assist;sitting/lateral leans   OT Frequency:  Min 2X/week    Co-evaluation              AM-PAC OT "6 Clicks" Daily Activity     Outcome Measure Help from another person eating meals?: None Help from another person taking care of personal grooming?: A Little Help from another person toileting, which includes using toliet, bedpan, or urinal?: A Lot Help from another person bathing (including washing, rinsing, drying)?: A Lot Help from another person to put on and taking off regular upper body clothing?: A Little Help from another person to put on and taking  off regular lower body clothing?: A Lot 6 Click Score: 16   End of Session Nurse Communication: Mobility status  Activity Tolerance: Patient limited by fatigue;Other (comment) (nausea, dizziness) Patient left:    OT Visit Diagnosis: Other abnormalities of gait and mobility (R26.89);Muscle weakness (generalized) (M62.81)                Time: 1610-9604 OT Time Calculation (min): 23 min Charges:  OT General Charges $OT Visit: 1 Visit OT Evaluation $OT Eval Moderate Complexity: 1 Mod OT Treatments $Therapeutic Activity: 8-22 mins    Alfred Ann 04/13/2024, 1:21  PM

## 2024-04-13 NOTE — Progress Notes (Signed)
 Progress Note  Patient Name: Shane [name] Date of Encounter: 04/13/2024  Primary Cardiologist: Alexandria Angel, MD   Subjective   Breathing better. Dyspnea improved.  Inpatient Medications    Scheduled Meds:  apixaban   10 mg Oral BID   Followed by   Cecily Cohen ON 04/19/2024] apixaban   5 mg Oral BID   atorvastatin   80 mg Oral QHS   benzonatate   200 mg Oral BID   escitalopram   10 mg Oral q AM   insulin  aspart  0-15 Units Subcutaneous TID WC   leptospermum manuka honey  1 Application Topical Daily   methylPREDNISolone (SOLU-MEDROL) injection  40 mg Intravenous Q12H   pantoprazole   40 mg Oral BID   sodium chloride  flush  3 mL Intravenous Q12H   Continuous Infusions:  PRN Meds: albuterol , guaiFENesin -dextromethorphan , ondansetron  (ZOFRAN ) IV, traZODone    Vital Signs    Vitals:   04/12/24 2234 04/12/24 2355 04/13/24 0421 04/13/24 0741  BP:  (!) 119/58 118/62 (!) 139/93  Pulse: 92 67 78 72  Resp: 20 18 20 19   Temp:  98 F (36.7 C) 98.8 F (37.1 C) 98.8 F (37.1 C)  TempSrc:  Oral Oral Oral  SpO2: 98% 97% 99% 96%  Weight:   105.3 kg   Height:        Intake/Output Summary (Last 24 hours) at 04/13/2024 1029 Last data filed at 04/13/2024 0426 Gross per 24 hour  Intake --  Output 2100 ml  Net -2100 ml   Filed Weights   04/11/24 0420 04/12/24 0438 04/13/24 0421  Weight: 109.8 kg 106.2 kg 105.3 kg    Telemetry    V paced - Personally Reviewed  ECG    No new - Personally Reviewed  Physical Exam   GEN: No acute distress.   Neck: No JVD Cardiac: RRR, no murmurs, rubs, or gallops.  Respiratory: Clear to auscultation bilaterally. GI: Soft, nontender, non-distended  MS: No edema; No deformity. Neuro:  Nonfocal  Psych: Normal affect   Labs    Chemistry Recent Labs  Lab 04/09/24 1528 04/10/24 0905 04/11/24 0220 04/12/24 0407 04/13/24 0505  NA 136 134* 134* 134* 133*  K 4.6 5.0 4.5 4.3 4.4  CL 99 99 100 97* 92*  CO2 22 25 23 26 30   GLUCOSE 86 135*  90 103* 130*  BUN 84* 85* 67* 53* 49*  CREATININE 2.44* 2.66* 1.90* 1.59* 1.80*  CALCIUM  9.0 8.6* 8.5* 8.9 8.8*  PROT 7.2 6.4* 6.6  --   --   ALBUMIN  3.2* 2.9* 2.9*  --   --   AST 19 17 18   --   --   ALT 16 14 15   --   --   ALKPHOS 91 83 84  --   --   BILITOT 0.8 0.6 0.7  --   --   GFRNONAA 26* 23* 35* 43* 37*  ANIONGAP 15 10 11 11 11      Hematology Recent Labs  Lab 04/11/24 0220 04/12/24 0407 04/13/24 0505  WBC 5.2 5.7 4.7  RBC 3.46* 3.84* 3.70*  HGB 10.5* 11.5* 11.1*  HCT 31.3* 34.7* 33.5*  MCV 90.5 90.4 90.5  MCH 30.3 29.9 30.0  MCHC 33.5 33.1 33.1  RDW 13.7 13.7 13.8  PLT 135* 137* 133*    Cardiac EnzymesNo results for input(s): "TROPONINI" in the last 168 hours. No results for input(s): "TROPIPOC" in the last 168 hours.   BNP Recent Labs  Lab 04/09/24 1909 04/11/24 2159  BNP 566.3* 1,178.8*  DDimer No results for input(s): "DDIMER" in the last 168 hours.   Radiology    DG Chest Port 1 View Result Date: 04/11/2024 CLINICAL DATA:  Acute respiratory distress EXAM: PORTABLE CHEST 1 VIEW COMPARISON:  Chest radiograph 04/09/2024 FINDINGS: Stable cardiomediastinal silhouette. TAVR. Leadless pacer. Sternotomy. Hazy airspace and interstitial opacities bilaterally greatest in the left mid and lower lung. Question small bilateral pleural effusions. No pneumothorax. IMPRESSION: Pulmonary edema with possible small bilateral pleural effusions. Diffuse pneumonia could appear similarly. Electronically Signed   By: Rozell Cornet M.D.   On: 04/11/2024 21:16   ECHOCARDIOGRAM COMPLETE Result Date: 04/11/2024    ECHOCARDIOGRAM REPORT   Patient Name:   Shane [name] Date of Exam: 04/11/2024 Medical Rec #:  [id number]        Height:       72.0 in Accession #:    [id number]       Weight:       242.0 lb Date of Birth:  07/20/1941        BSA:          2.310 m Patient Age:    82 years         BP:           126/72 mmHg Patient Gender: M                HR:           63 bpm. Exam Location:   Inpatient Procedure: 2D Echo, Cardiac Doppler, Color Doppler and Intracardiac            Opacification Agent (Both Spectral and Color Flow Doppler were            utilized during procedure). Indications:    CHF  History:        Patient has prior history of Echocardiogram examinations, most                 recent 12/07/2023.                 Aortic Valve: 29 mm native pulmonic valve is present in the                 aortic position.  Sonographer:    Hersey Lorenzo RDCS Referring Phys: [phone] KENNETH C HILTY IMPRESSIONS  1. Left ventricular ejection fraction, by estimation, is 35 to 40%. Left ventricular ejection fraction by 2D MOD biplane is 39.9 %. The left ventricle has moderately decreased function. The left ventricle demonstrates regional wall motion abnormalities (see scoring diagram/findings for description). There is severe concentric left ventricular hypertrophy. Left ventricular diastolic parameters are indeterminate.  2. Right ventricular systolic function is normal. The right ventricular size is normal.  3. The mitral valve is normal in structure. No evidence of mitral valve regurgitation. No evidence of mitral stenosis.  4. The aortic valve has been repaired/replaced. Aortic valve regurgitation is not visualized. No aortic stenosis is present. There is a 29 mm native pulmonic valve present in the aortic position.  5. The inferior vena cava is normal in size with greater than 50% respiratory variability, suggesting right atrial pressure of 3 mmHg. FINDINGS  Left Ventricle: Left ventricular ejection fraction, by estimation, is 35 to 40%. Left ventricular ejection fraction by 2D MOD biplane is 39.9 %. The left ventricle has moderately decreased function. The left ventricle demonstrates regional wall motion abnormalities. The left ventricular internal cavity size was normal in size. There is severe concentric left ventricular hypertrophy. Left ventricular  diastolic parameters are indeterminate.  LV Wall Scoring:  The entire septum and entire inferior wall are hypokinetic. The entire anterior wall, entire lateral wall, and apex are normal. Right Ventricle: The right ventricular size is normal. No increase in right ventricular wall thickness. Right ventricular systolic function is normal. Left Atrium: Left atrial size was normal in size. Right Atrium: Right atrial size was normal in size. Pericardium: There is no evidence of pericardial effusion. Mitral Valve: The mitral valve is normal in structure. No evidence of mitral valve regurgitation. No evidence of mitral valve stenosis. Tricuspid Valve: The tricuspid valve is normal in structure. Tricuspid valve regurgitation is trivial. No evidence of tricuspid stenosis. Aortic Valve: The aortic valve has been repaired/replaced. Aortic valve regurgitation is not visualized. No aortic stenosis is present. There is a 29 mm native pulmonic valve present in the aortic position. Pulmonic Valve: The pulmonic valve was normal in structure. Pulmonic valve regurgitation is not visualized. No evidence of pulmonic stenosis. Aorta: The aortic root is normal in size and structure. Venous: The inferior vena cava is normal in size with greater than 50% respiratory variability, suggesting right atrial pressure of 3 mmHg. IAS/Shunts: No atrial level shunt detected by color flow Doppler.  LEFT VENTRICLE PLAX 2D                        Biplane EF (MOD) LVIDd:         5.20 cm         LV Biplane EF:   Left LVIDs:         4.60 cm                          ventricular LV PW:         1.80 cm                          ejection LV IVS:        1.80 cm                          fraction by LVOT diam:     2.00 cm                          2D MOD LV SV:         46                               biplane is LV SV Index:   20                               39.9 %. LVOT Area:     3.14 cm                                Diastology                                LV e' medial:    4.05 cm/s LV Volumes (MOD)               LV  E/e' medial:  32.6 LV vol d,  MOD    111.0 ml      LV e' lateral:   10.20 cm/s A2C:                           LV E/e' lateral: 12.9 LV vol d, MOD    117.0 ml A4C: LV vol s, MOD    59.7 ml A2C: LV vol s, MOD    80.7 ml A4C: LV SV MOD A2C:   51.3 ml LV SV MOD A4C:   117.0 ml LV SV MOD BP:    47.2 ml RIGHT VENTRICLE         IVC TAPSE (M-mode): 0.7 cm  IVC diam: 1.80 cm LEFT ATRIUM           Index LA diam:      4.50 cm 1.95 cm/m LA Vol (A4C): 51.2 ml 22.16 ml/m  AORTIC VALVE LVOT Vmax:   74.20 cm/s LVOT Vmean:  54.300 cm/s LVOT VTI:    0.146 m  AORTA Ao Root diam: 3.60 cm Ao Asc diam:  3.80 cm MITRAL VALVE MV Area (PHT): 3.65 cm     SHUNTS MV Decel Time: 208 msec     Systemic VTI:  0.15 m MV E velocity: 132.00 cm/s  Systemic Diam: 2.00 cm MV A velocity: 33.00 cm/s MV E/A ratio:  4.00 Maudine Sos MD Electronically signed by Maudine Sos MD Signature Date/Time: 04/11/2024/2:19:04 PM    Final     Cardiac Studies   See above.  Patient Profile     83 y.o. male admitted with weakness and fatigue and found to have a PE.   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 I suspect he was a little dry. PE - he is on systemic anti-coag. Continue. CAD - he denies anginal symptoms.  AS - he is s/p TAVR.  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 He had his initial PM removed due to a staph infection and now has a leadless. His ECG from 5/8 shows V pacing and retrograde atrial activation. We will see about reprogramming.      For questions or updates, please contact [facility] Please consult [email or web address] for contact info under Cardiology/STEMI.      Signed, Manya Sells, MD  04/13/2024, 10:29 AM

## 2024-04-14 DIAGNOSIS — R55 Syncope and collapse: Secondary | ICD-10-CM | POA: Diagnosis not present

## 2024-04-14 LAB — BASIC METABOLIC PANEL WITH GFR
Anion gap: 13 (ref 5–15)
BUN: 48 mg/dL — ABNORMAL HIGH (ref 8–23)
CO2: 27 mmol/L (ref 22–32)
Calcium: 8.6 mg/dL — ABNORMAL LOW (ref 8.9–10.3)
Chloride: 89 mmol/L — ABNORMAL LOW (ref 98–111)
Creatinine, Ser: 1.6 mg/dL — ABNORMAL HIGH (ref 0.61–1.24)
GFR, Estimated: 43 mL/min — ABNORMAL LOW (ref >60)
Glucose, Bld: 313 mg/dL — ABNORMAL HIGH (ref 70–99)
Potassium: 4.5 mmol/L (ref 3.5–5.1)
Sodium: 129 mmol/L — ABNORMAL LOW (ref 135–145)

## 2024-04-14 LAB — GLUCOSE, CAPILLARY
Glucose-Capillary: 311 mg/dL — ABNORMAL HIGH (ref 70–99)
Glucose-Capillary: 344 mg/dL — ABNORMAL HIGH (ref 70–99)

## 2024-04-14 MED ORDER — INSULIN ASPART 100 UNIT/ML IJ SOLN
3.0000 [IU] | Freq: Three times a day (TID) | INTRAMUSCULAR | Status: DC
  Administered 2024-04-14: 3 [IU] via SUBCUTANEOUS

## 2024-04-14 MED ORDER — PREDNISONE 20 MG PO TABS
40.0000 mg | ORAL_TABLET | Freq: Every day | ORAL | 0 refills | Status: AC
Start: 2024-04-14 — End: 2024-04-17

## 2024-04-14 MED ORDER — HUMALOG KWIKPEN 100 UNIT/ML ~~LOC~~ SOPN: 10.0000 [IU] | PEN_INJECTOR | Freq: Three times a day (TID) | SUBCUTANEOUS | Status: DC

## 2024-04-14 MED ORDER — PREDNISONE 20 MG PO TABS: 40.0000 mg | ORAL_TABLET | Freq: Every day | ORAL | Status: DC

## 2024-04-14 MED ORDER — APIXABAN (ELIQUIS) VTE STARTER PACK (10MG AND 5MG): ORAL_TABLET | ORAL | 0 refills | Status: DC

## 2024-04-14 MED ORDER — INSULIN GLARGINE-YFGN 100 UNIT/ML ~~LOC~~ SOLN
20.0000 [IU] | Freq: Every day | SUBCUTANEOUS | Status: DC
  Administered 2024-04-14: 20 [IU] via SUBCUTANEOUS
  Filled 2024-04-14: qty 0.2

## 2024-04-14 MED ORDER — FUROSEMIDE 40 MG PO TABS: 40.0000 mg | ORAL_TABLET | Freq: Two times a day (BID) | ORAL | Status: DC

## 2024-04-14 MED ORDER — TRESIBA FLEXTOUCH 100 UNIT/ML ~~LOC~~ SOPN: 25.0000 [IU] | PEN_INJECTOR | Freq: Every morning | SUBCUTANEOUS | Status: DC

## 2024-04-14 NOTE — Discharge Summary (Signed)
 Physician Discharge Summary  Shane [name] ZOX:[id number] DOB: 10-02-1941 DOA: 04/09/2024  PCP: Suan Elm, MD  Admit date: 04/09/2024 Discharge date: 04/14/2024  Time spent: 45 minutes  Recommendations for Outpatient Follow-up:  PCP in 1 week, please check BMP at follow-up, may need potassium supplementation Cardiology Dr. 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 - Brought to [facility] ED 5/6 following syncopal episode while at his rental property, he did notice lightheadedness and dizziness prior to and felt generally weaker yesterday.  EMS noted hypotension, given IV fluids, ED blood pressure was 95/54, creatinine up to 2.4, mag 1.6, hemoglobin 11.5, chest x-ray with mild pulmonary vascular congestion, lactic acid 1.1  Hospital Course:   Syncope and  collapse Hypotension -Likely multifactorial from hypotension, dehydration, acute PE -Pacer interrogation noted normal function prior to admission, EP noted some pacemaker syndrome with synchronous ventricular pacing today, he will have reprogramming of his device to send sinus beats and synchronously placed today prior to discharge -Lisinopril  discontinued, metoprolol  disc continued for bradycardia -VQ scan positive for right lower lobe pulmonary embolism - Echo with EF 35-40%, normal RV, wall motion abnormalities, severe LVH   Acute PE - Continue Eliquis , RV normal on echo   Acute kidney injury superimposed on stage 3a chronic kidney disease - Hemodynamically mediated from hypotension -improving, almost back to baseline, creatinine 1.6 at discharge - Discontinued lisinopril  with AKI and hypotension - Renal ultrasound without hydronephrosis   Acute on chronic systolic CHF - Echo with EF 35-40%, severe LVH, normal RV - Diuresed with IV Lasix  this admission, volume status is improving, switched back to home regimen of Lasix  40 Mg twice daily - GDMT limited by hypotension/AKI - Now stable for discharge, consider Aldactone at follow-up   Viral URI, reactive airway disease Positive for metapneumovirus, low-grade fevers 2 days ago - Improving with IV steroids, pulmonary toilet, switch to prednisone taper for 2 to 3 days   Diabetic foot ulcer (HCC) -Anterior wound superficial without any signs or symptoms of infection, left foot wound is deeper but without signs or symptoms of secondary infection, continue routine wound care - Follow-up with Dr. Julio Ohm   Type 2 diabetes mellitus without complication, with long-term current use of insulin  (HCC) - CBG elevated on steroids, Tresiba  resumed, A1c previous 1 was 8.5.   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  Per wife pt's physician uses doppler device to check his pulses in his  feet. - Followed by Dr. 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HCC) -uses  as needed oxygen during the day  - Followed by Dr. Bertrum Brodie   Code Status: Discussed CODE STATUS with the patient, DNR  Discharge Exam: Vitals:   04/14/24 0500 04/14/24 0814  BP: 135/61 (!) 116/54  Pulse: 63 62  Resp: 18 (!) 22  Temp: 97.6 F (36.4 C) 97.7 F (36.5 C)  SpO2:  97%   Gen: Awake, Alert, Oriented X 3,  HEENT: no JVD Lungs: improving air movement, bibasilar rales, rare expiratory wheezes CVS: S1S2/RRR Abd: soft, Non tender, non distended, BS present Extremities: Left BKA, transmetatarsal amputation with 2 small wounds Skin: As above  Discharge Instructions   Discharge Instructions     Diet - low sodium heart healthy   Complete by: As directed    Diet - low sodium heart healthy   Complete by: As directed    Discharge instructions   Complete by: As directed    Follow-up with PCP in 1 week, needs BMP check at follow-up, may need potassium supplementation at that time depending on levels   Discharge wound care:   Complete by: As directed    routine   Discharge wound care:   Complete by: As directed    Apply Medihoney to foot wounds daily, cover with foam dressing, change foam dressing every 3 days or as needed for soiling   Increase activity slowly   Complete by: As directed    Increase activity slowly   Complete by: As directed       Allergies as of 04/14/2024       Reactions   Codeine Hives, Itching   Ofev  [nintedanib ] Diarrhea   SEVERE DIARRHEA   Pirfenidone  Diarrhea, Other (See Comments)   Esbriet  (Pirfenidone ) causes elevated LFTs. D/C on 06/14/17 and SEVERE DIARRHEA         Medication List     STOP taking these medications    lisinopril  10 MG tablet Commonly known as: ZESTRIL    metoprolol  succinate 25 MG 24 hr tablet Commonly known as: TOPROL -XL       TAKE these medications    albuterol  (2.5 MG/3ML) 0.083% nebulizer solution Commonly known as: PROVENTIL  Take 2.5 mg by nebulization 2 (two) times daily as needed for wheezing or shortness of breath. What changed: Another medication with the same name was changed. Make sure you understand how and when to take each.   albuterol  108 (90 Base) MCG/ACT inhaler Commonly known as: VENTOLIN  HFA Inhale 2 puffs into the lungs every 4 (four) hours as needed for wheezing or shortness of breath. What changed: how much to take   Apixaban  Starter Pack (10mg  and 5mg ) Commonly known as: ELIQUIS  STARTER PACK Take as directed on package: start with two-5mg  tablets twice daily for 7 days. On day 8, switch to one-5mg  tablet twice daily.   atorvastatin  80 MG tablet Commonly known as: LIPITOR  TAKE 1 TABLET BY MOUTH EVERY DAY What changed: when to take this   benzonatate  100 MG capsule Commonly known as: TESSALON  Take 1 capsule (100 mg total) by mouth 3 (three) times daily.  What changed:  when to take this reasons to take this   cyanocobalamin  1000 MCG tablet Take 1 tablet (1,000 mcg total) by mouth daily. What changed: when to take this   dextromethorphan  30 MG/5ML liquid Commonly known as: DELSYM  Take 2.5 mLs (15 mg total) by mouth 2 (two) times daily. What changed: when to take this   docusate sodium  100 MG capsule Commonly known as: COLACE Take 1 capsule (100 mg total) by mouth 2 (two) times daily. What changed:  when to take this reasons to take this   escitalopram  10 MG tablet Commonly known as: LEXAPRO  Take 1 tablet (10 mg total) by mouth daily. What changed: when to take this   feeding supplement (NEPRO CARB STEADY) Liqd Take 237 mLs by mouth 2 (two) times daily between meals.    furosemide  40 MG tablet Commonly known as: Lasix  Take 1 tablet (40 mg total) by mouth 2 (two) times daily. What changed:  when to take this Another medication with the same name was removed. Continue taking this medication, and follow the directions you see here.   gabapentin  100 MG capsule Commonly known as: NEURONTIN  Take 1 capsule (100 mg total) by mouth 3 (three) times daily.   HumaLOG  KwikPen 100 UNIT/ML KwikPen Generic drug: insulin  lispro Inject 10-15 Units into the skin 3 (three) times daily. Can take 15-18 Units with meals while on Prednisone for 3days What changed: additional instructions   HYDROcodone  bit-homatropine 5-1.5 MG/5ML syrup Commonly known as: HYCODAN Take 5 mLs by mouth every 6 (six) hours as needed for cough.   HYDROcodone -acetaminophen  5-325 MG tablet Commonly known as: NORCO/VICODIN Take 1 tablet by mouth at bedtime as needed.   LORazepam  1 MG tablet Commonly known as: ATIVAN  Take 1 tablet (1 mg total) by mouth at bedtime as needed for sleep.   methocarbamol  500 MG tablet Commonly known as: ROBAXIN  Take 1 tablet (500 mg total) by mouth every 8 (eight) hours as needed for muscle spasms.   multivitamin with minerals Tabs tablet Take 1 tablet by mouth in the morning.   mupirocin  ointment 2 % Commonly known as: BACTROBAN  Apply 1 Application topically at bedtime.   ondansetron  4 MG tablet Commonly known as: ZOFRAN  Take 4 mg by mouth as needed for nausea or vomiting.   OXYGEN Inhale 3 L into the lungs See admin instructions. Inhale 3L of Oxygen everynight at bedside. May uses throughout the day as needed.   pantoprazole  40 MG tablet Commonly known as: PROTONIX  Take 1 tablet (40 mg total) by mouth 2 (two) times daily.   PATADAY OP Place 2 drops into both eyes daily as needed (allergies).   polyethylene glycol powder 17 GM/SCOOP powder Commonly known as: GLYCOLAX /MIRALAX  Take 17 g by mouth as needed for mild constipation or moderate  constipation.   predniSONE 20 MG tablet Commonly known as: DELTASONE Take 2 tablets (40 mg total) by mouth daily with breakfast for 3 days.   traZODone  50 MG tablet Commonly known as: DESYREL  Take 75 mg by mouth at bedtime as needed for sleep.   Tresiba  FlexTouch 100 UNIT/ML FlexTouch Pen Generic drug: insulin  degludec Inject 25 Units into the skin in the morning. What changed:  how much to take when to take this   TYLENOL  PO Take 2 tablets by mouth as needed. May take 2 tablet by mouth 3 hours after taking the trazodone  75mg  prn.   vitamin C  with rose hips 500 MG tablet Take 1 tablet (500 mg total) by mouth daily.  What changed: when to take this   zinc  sulfate (50mg  elemental zinc ) 220 (50 Zn) MG capsule Take 1 capsule (220 mg total) by mouth daily. What changed: when to take this               Discharge Care Instructions  (From admission, onward)           Start     Ordered   04/14/24 0000  Discharge wound care:       Comments: routine   04/14/24 0958   04/14/24 0000  Discharge wound care:       Comments: Apply Medihoney to foot wounds daily, cover with foam dressing, change foam dressing every 3 days or as needed for soiling   04/14/24 1000           Allergies  Allergen Reactions   Codeine Hives and Itching   Ofev  [Nintedanib ] Diarrhea    SEVERE DIARRHEA   Pirfenidone  Diarrhea and Other (See Comments)    Esbriet  (Pirfenidone ) causes elevated LFTs. D/C on 06/14/17 and SEVERE DIARRHEA     Follow-up Information     Suan Elm, MD. Schedule an appointment as soon as possible for a visit in 1 week(s).   Specialty: Internal Medicine Contact information: [address] [place] [address] [phone]         Lenise Quince, MD. Schedule an appointment as soon as possible for a visit in 3 week(s).   Specialty: Cardiology Contact information: [address] [place] [address] [phone]                  The  results of significant diagnostics from this hospitalization (including imaging, microbiology, ancillary and laboratory) are listed below for reference.    Significant Diagnostic Studies: DG Chest Port 1 View Result Date: 04/11/2024 CLINICAL DATA:  Acute respiratory distress EXAM: PORTABLE CHEST 1 VIEW COMPARISON:  Chest radiograph 04/09/2024 FINDINGS: Stable cardiomediastinal silhouette. TAVR. Leadless pacer. Sternotomy. Hazy airspace and interstitial opacities bilaterally greatest in the left mid and lower lung. Question small bilateral pleural effusions. No pneumothorax. IMPRESSION: Pulmonary edema with possible small bilateral pleural effusions. Diffuse pneumonia could appear similarly. Electronically Signed   By: Rozell Cornet M.D.   On: 04/11/2024 21:16   ECHOCARDIOGRAM COMPLETE Result Date: 04/11/2024    ECHOCARDIOGRAM REPORT   Patient Name:   Shane [name] Date of Exam: 04/11/2024 Medical Rec #:  [id number]        Height:       72.0 in Accession #:    [id number]       Weight:       242.0 lb Date of Birth:  05/30/1941        BSA:          2.310 m Patient Age:    82 years         BP:           126/72 mmHg Patient Gender: M                HR:           63 bpm. Exam Location:  Inpatient Procedure: 2D Echo, Cardiac Doppler, Color Doppler and Intracardiac            Opacification Agent (Both Spectral and Color Flow Doppler were            utilized during procedure). Indications:    CHF  History:        Patient has prior history of  Echocardiogram examinations, most                 recent 12/07/2023.                 Aortic Valve: 29 mm native pulmonic valve is present in the                 aortic position.  Sonographer:    Hersey Lorenzo RDCS Referring Phys: [phone] KENNETH C HILTY IMPRESSIONS  1. Left ventricular ejection fraction, by estimation, is 35 to 40%. Left ventricular ejection fraction by 2D MOD biplane is 39.9 %. The left ventricle has moderately decreased function. The left ventricle demonstrates  regional wall motion abnormalities (see scoring diagram/findings for description). There is severe concentric left ventricular hypertrophy. Left ventricular diastolic parameters are indeterminate.  2. Right ventricular systolic function is normal. The right ventricular size is normal.  3. The mitral valve is normal in structure. No evidence of mitral valve regurgitation. No evidence of mitral stenosis.  4. The aortic valve has been repaired/replaced. Aortic valve regurgitation is not visualized. No aortic stenosis is present. There is a 29 mm native pulmonic valve present in the aortic position.  5. The inferior vena cava is normal in size with greater than 50% respiratory variability, suggesting right atrial pressure of 3 mmHg. FINDINGS  Left Ventricle: Left ventricular ejection fraction, by estimation, is 35 to 40%. Left ventricular ejection fraction by 2D MOD biplane is 39.9 %. The left ventricle has moderately decreased function. The left ventricle demonstrates regional wall motion abnormalities. The left ventricular internal cavity size was normal in size. There is severe concentric left ventricular hypertrophy. Left ventricular diastolic parameters are indeterminate.  LV Wall Scoring: The entire septum and entire inferior wall are hypokinetic. The entire anterior wall, entire lateral wall, and apex are normal. Right Ventricle: The right ventricular size is normal. No increase in right ventricular wall thickness. Right ventricular systolic function is normal. Left Atrium: Left atrial size was normal in size. Right Atrium: Right atrial size was normal in size. Pericardium: There is no evidence of pericardial effusion. Mitral Valve: The mitral valve is normal in structure. No evidence of mitral valve regurgitation. No evidence of mitral valve stenosis. Tricuspid Valve: The tricuspid valve is normal in structure. Tricuspid valve regurgitation is trivial. No evidence of tricuspid stenosis. Aortic Valve: The aortic  valve has been repaired/replaced. Aortic valve regurgitation is not visualized. No aortic stenosis is present. There is a 29 mm native pulmonic valve present in the aortic position. Pulmonic Valve: The pulmonic valve was normal in structure. Pulmonic valve regurgitation is not visualized. No evidence of pulmonic stenosis. Aorta: The aortic root is normal in size and structure. Venous: The inferior vena cava is normal in size with greater than 50% respiratory variability, suggesting right atrial pressure of 3 mmHg. IAS/Shunts: No atrial level shunt detected by color flow Doppler.  LEFT VENTRICLE PLAX 2D                        Biplane EF (MOD) LVIDd:         5.20 cm         LV Biplane EF:   Left LVIDs:         4.60 cm                          ventricular LV PW:         1.80  cm                          ejection LV IVS:        1.80 cm                          fraction by LVOT diam:     2.00 cm                          2D MOD LV SV:         46                               biplane is LV SV Index:   20                               39.9 %. LVOT Area:     3.14 cm                                Diastology                                LV e' medial:    4.05 cm/s LV Volumes (MOD)               LV E/e' medial:  32.6 LV vol d, MOD    111.0 ml      LV e' lateral:   10.20 cm/s A2C:                           LV E/e' lateral: 12.9 LV vol d, MOD    117.0 ml A4C: LV vol s, MOD    59.7 ml A2C: LV vol s, MOD    80.7 ml A4C: LV SV MOD A2C:   51.3 ml LV SV MOD A4C:   117.0 ml LV SV MOD BP:    47.2 ml RIGHT VENTRICLE         IVC TAPSE (M-mode): 0.7 cm  IVC diam: 1.80 cm LEFT ATRIUM           Index LA diam:      4.50 cm 1.95 cm/m LA Vol (A4C): 51.2 ml 22.16 ml/m  AORTIC VALVE LVOT Vmax:   74.20 cm/s LVOT Vmean:  54.300 cm/s LVOT VTI:    0.146 m  AORTA Ao Root diam: 3.60 cm Ao Asc diam:  3.80 cm MITRAL VALVE MV Area (PHT): 3.65 cm     SHUNTS MV Decel Time: 208 msec     Systemic VTI:  0.15 m MV E velocity: 132.00 cm/s  Systemic Diam:  2.00 cm MV A velocity: 33.00 cm/s MV E/A ratio:  4.00 Maudine Sos MD Electronically signed by Maudine Sos MD Signature Date/Time: 04/11/2024/2:19:04 PM    Final    NM Pulmonary Perfusion Result Date: 04/10/2024 CLINICAL DATA:  Pulmonary embolism, syncope concern for acute pulmonary embolism. EXAM: NUCLEAR MEDICINE PERFUSION LUNG SCAN TECHNIQUE: Perfusion images were obtained in multiple projections after intravenous injection of radiopharmaceutical. RADIOPHARMACEUTICALS:  4.4 mCi Tc-31m MAA COMPARISON:  Radiograph 04/09/2024 FINDINGS: There is a wedge-shaped peripheral perfusion defect in the posterior RIGHT lower  lobe. Generalized poor perfusion to the LEFT midlung may relate to pleural effusion. IMPRESSION: Concern for RIGHT lower lobe pulmonary embolism.  High probability. These results will be called to the ordering clinician or representative by the Radiologist Assistant, and communication documented in the PACS or [employer]. Electronically Signed   By: Deboraha Fallow M.D.   On: 04/10/2024 17:51   US  RENAL Result Date: 04/09/2024 CLINICAL DATA:  Acute renal injury EXAM: RENAL / URINARY TRACT ULTRASOUND COMPLETE COMPARISON:  12/30/2023 FINDINGS: Right Kidney: Renal measurements: 11.3 x 4.9 x 4.4 cm. = volume: 129 mL. Echogenicity within normal limits. No mass or hydronephrosis visualized. Left Kidney: Renal measurements: 11.0 x 5.1 x 4.1 cm. = volume: 122 mL. Echogenicity within normal limits. No mass or hydronephrosis visualized. Bladder: Appears normal for degree of bladder distention. Other: None. IMPRESSION: No acute abnormality in the kidneys. Electronically Signed   By: Violeta Grey M.D.   On: 04/09/2024 21:39   CT Head Wo Contrast Result Date: 04/09/2024 CLINICAL DATA:  Syncope/presyncope, cerebrovascular cause suspected EXAM: CT HEAD WITHOUT CONTRAST TECHNIQUE: Contiguous axial images were obtained from the base of the skull through the vertex without intravenous contrast.  RADIATION DOSE REDUCTION: This exam was performed according to the departmental dose-optimization program which includes automated exposure control, adjustment of the mA and/or kV according to patient size and/or use of iterative reconstruction technique. COMPARISON:  CT head April 24, 21. FINDINGS: Brain: No evidence of acute infarction, hemorrhage, hydrocephalus, extra-axial collection or mass lesion/mass effect. Vascular: No hyperdense vessel identified. Skull: No acute fracture. Sinuses/Orbits: Clear sinuses.  No acute orbital findings. Other: No mastoid effusions. IMPRESSION: No evidence of acute intracranial abnormality. Electronically Signed   By: Stevenson Elbe M.D.   On: 04/09/2024 20:49   DG Chest 2 View Result Date: 04/09/2024 CLINICAL DATA:  Syncope. EXAM: CHEST - 2 VIEW COMPARISON:  December 07, 2023 FINDINGS: Multiple sternal wires and vascular clips are seen. The cardiac silhouette is enlarged and unchanged in size. An artificial aortic valve is present. Low lung volumes are noted with mild prominence of the pulmonary vasculature. There is no evidence of focal consolidation or pneumothorax. A small left pleural effusion is suspected. Postoperative changes are seen throughout the cervical spine with multilevel degenerative changes noted throughout the thoracic spine. IMPRESSION: 1. Evidence of prior median sternotomy/CABG. 2. Low lung volumes with mild pulmonary vascular congestion. 3. Small left pleural effusion. Electronically Signed   By: Virgle Grime M.D.   On: 04/09/2024 16:18    Microbiology: Recent Results (from the past 240 hours)  Culture, blood (Routine X 2) w Reflex to ID Panel     Status: None (Preliminary result)   Collection Time: 04/09/24  8:49 PM   Specimen: BLOOD  Result Value Ref Range Status   Specimen Description BLOOD BLOOD LEFT HAND  Final   Special Requests   Final    BOTTLES DRAWN AEROBIC ONLY Blood Culture adequate volume   Culture   Final    NO GROWTH 4  DAYS Performed at [facility], [address] [place], [place] [address]    Report Status PENDING  Incomplete  Culture, blood (Routine X 2) w Reflex to ID Panel     Status: None (Preliminary result)   Collection Time: 04/09/24 11:40 PM   Specimen: BLOOD RIGHT HAND  Result Value Ref Range Status   Specimen Description BLOOD RIGHT HAND  Final   Special Requests   Final    BOTTLES DRAWN AEROBIC AND  ANAEROBIC Blood Culture adequate volume   Culture   Final    NO GROWTH 4 DAYS Performed at [facility], [address] [place], [place] [address]    Report Status PENDING  Incomplete  Respiratory (~20 pathogens) panel by PCR     Status: Abnormal   Collection Time: 04/12/24  4:55 PM   Specimen: Nasopharyngeal Swab; Respiratory  Result Value Ref Range Status   Adenovirus NOT DETECTED NOT DETECTED Final   Coronavirus 229E NOT DETECTED NOT DETECTED Final    Comment: (NOTE) The Coronavirus on the Respiratory Panel, DOES NOT test for the novel  Coronavirus (2019 nCoV)    Coronavirus HKU1 NOT DETECTED NOT DETECTED Final   Coronavirus NL63 NOT DETECTED NOT DETECTED Final   Coronavirus OC43 NOT DETECTED NOT DETECTED Final   Metapneumovirus DETECTED (A) NOT DETECTED Final   Rhinovirus / Enterovirus NOT DETECTED NOT DETECTED Final   Influenza A NOT DETECTED NOT DETECTED Final   Influenza B NOT DETECTED NOT DETECTED Final   Parainfluenza Virus 1 NOT DETECTED NOT DETECTED Final   Parainfluenza Virus 2 NOT DETECTED NOT DETECTED Final   Parainfluenza Virus 3 NOT DETECTED NOT DETECTED Final   Parainfluenza Virus 4 NOT DETECTED NOT DETECTED Final   Respiratory Syncytial Virus NOT DETECTED NOT DETECTED Final   Bordetella pertussis NOT DETECTED NOT DETECTED Final   Bordetella Parapertussis NOT DETECTED NOT DETECTED Final   Chlamydophila pneumoniae NOT DETECTED NOT DETECTED Final   Mycoplasma pneumoniae NOT DETECTED NOT DETECTED Final    Comment: Performed at [facility],  [address] [place], [place] [address]     Labs: Basic Metabolic Panel: Recent Labs  Lab 04/09/24 1528 04/10/24 0905 04/11/24 0220 04/12/24 0407 04/13/24 0505 04/14/24 0431  NA 136 134* 134* 134* 133* 129*  K 4.6 5.0 4.5 4.3 4.4 4.5  CL 99 99 100 97* 92* 89*  CO2 22 25 23 26 30 27   GLUCOSE 86 135* 90 103* 130* 313*  BUN 84* 85* 67* 53* 49* 48*  CREATININE 2.44* 2.66* 1.90* 1.59* 1.80* 1.60*  CALCIUM  9.0 8.6* 8.5* 8.9 8.8* 8.6*  MG 1.6*  --   --   --   --   --    Liver Function Tests: Recent Labs  Lab 04/09/24 1528 04/10/24 0905 04/11/24 0220  AST 19 17 18   ALT 16 14 15   ALKPHOS 91 83 84  BILITOT 0.8 0.6 0.7  PROT 7.2 6.4* 6.6  ALBUMIN  3.2* 2.9* 2.9*   No results for input(s): "LIPASE", "AMYLASE" in the last 168 hours. No results for input(s): "AMMONIA" in the last 168 hours. CBC: Recent Labs  Lab 04/09/24 1528 04/09/24 2338 04/10/24 0905 04/11/24 0220 04/12/24 0407 04/13/24 0505  WBC 6.2  --  5.1 5.2 5.7 4.7  NEUTROABS 4.0  --   --   --   --   --   HGB 11.5* 11.2* 10.4* 10.5* 11.5* 11.1*  HCT 35.6* 34.7* 31.4* 31.3* 34.7* 33.5*  MCV 94.2  --  90.8 90.5 90.4 90.5  PLT 161  --  134* 135* 137* 133*   Cardiac Enzymes: Recent Labs  Lab 04/09/24 2338  CKTOTAL 71   BNP: BNP (last 3 results) Recent Labs    12/06/23 2310 04/09/24 1909 04/11/24 2159  BNP 480.6* 566.3* 1,178.8*    ProBNP (last 3 results) No results for input(s): "PROBNP" in the last 8760 hours.  CBG: Recent Labs  Lab 04/13/24 1712 04/13/24 2116 04/13/24 2245 04/14/24 0518  04/14/24 0817  GLUCAP 259* 306* 286* 311* 344*       Signed:  Deforest Fast MD.  [facility] 04/14/2024, 10:58 AM

## 2024-04-14 NOTE — TOC Transition Note (Signed)
 Transition of Care [facility]) - Discharge Note   Patient Details  Name: Shane [name] MRN: [id number] Date of Birth: 1941-07-15  Transition of Care [facility]) CM/SW Contact:  Jannine Meo, RN Phone Number: 04/14/2024, 11:37 AM   Clinical Narrative:   Patient is being discharged today. Provider aware of need for resumption of HH orders, Order for PT /RN to be placed. Cory with [facility] made aware.    Final next level of care: Home w Home Health Services Barriers to Discharge: No Barriers Identified   Patient Goals and CMS Choice Patient states their goals for this hospitalization and ongoing recovery are:: Plan to return home once stable.          Discharge Placement                       Discharge Plan and Services Additional resources added to the After Visit Summary for   In-house Referral: NA Discharge Planning Services: CM Consult Post Acute Care Choice: Home Health, Resumption of Svcs/PTA Provider            DME Agency: NA       HH Arranged: RN, Disease Management HH Agency: [facility] Date [facility] Agency Contacted: 04/11/24 Time HH Agency Contacted: 1523 Representative spoke with at [facility] Agency: Randel Buss  Social Drivers of Health (SDOH) Interventions SDOH Screenings   Food Insecurity: No Food Insecurity (04/10/2024)  Housing: Low Risk  (04/10/2024)  Transportation Needs: No Transportation Needs (04/10/2024)  Utilities: Not At Risk (04/10/2024)  Depression (PHQ2-9): Low Risk  (01/11/2021)  Financial Resource Strain: Low Risk  (08/22/2023)   Received from [facility]  Social Connections: Moderately Integrated (04/10/2024)  Tobacco Use: Low Risk  (04/09/2024)     Readmission Risk Interventions    12/15/2023    2:29 PM  Readmission Risk Prevention Plan  Transportation Screening Complete  Medication Review (RN Care Manager) Complete  PCP or Specialist appointment within 3-5 days of discharge Complete  HRI or Home Care Consult Complete  SW  Recovery Care/Counseling Consult Complete  Palliative Care Screening Complete  Skilled Nursing Facility Not Applicable

## 2024-04-14 NOTE — Progress Notes (Signed)
 Progress Note  Patient Name: Shane [name] Date of Encounter: 04/14/2024  Primary Cardiologist: Alexandria Angel, MD   Subjective   "I'm breathing bettter doc."  Inpatient Medications    Scheduled Meds:  apixaban   10 mg Oral BID   Followed by   Cecily Cohen ON 04/19/2024] apixaban   5 mg Oral BID   atorvastatin   80 mg Oral QHS   benzonatate   200 mg Oral BID   escitalopram   10 mg Oral q AM   insulin  aspart  0-15 Units Subcutaneous TID WC   insulin  aspart  0-5 Units Subcutaneous QHS   insulin  aspart  3 Units Subcutaneous TID WC   insulin  glargine-yfgn  20 Units Subcutaneous Daily   leptospermum manuka honey  1 Application Topical Daily   pantoprazole   40 mg Oral BID   sodium chloride  flush  3 mL Intravenous Q12H   Continuous Infusions:  PRN Meds: albuterol , guaiFENesin -dextromethorphan , ondansetron  (ZOFRAN ) IV, mouth rinse, traZODone    Vital Signs    Vitals:   04/13/24 1920 04/14/24 0033 04/14/24 0500 04/14/24 0814  BP: 123/74 (!) 112/58 135/61 (!) 116/54  Pulse:   63 62  Resp: 20 16 18  (!) 22  Temp: 98.1 F (36.7 C) 97.8 F (36.6 C) 97.6 F (36.4 C) 97.7 F (36.5 C)  TempSrc: Oral Oral Oral Oral  SpO2:    97%  Weight:   103.1 kg   Height:        Intake/Output Summary (Last 24 hours) at 04/14/2024 0940 Last data filed at 04/14/2024 0815 Gross per 24 hour  Intake 1080 ml  Output 1350 ml  Net -270 ml   Filed Weights   04/12/24 0438 04/13/24 0421 04/14/24 0500  Weight: 106.2 kg 105.3 kg 103.1 kg    Telemetry    Dysynchronous V pacing with NSR underneath. - Personally Reviewed  ECG    none - Personally Reviewed  Physical Exam   GEN: No acute distress.   Neck: No JVD Cardiac: RRR, no murmurs, rubs, or gallops.  Respiratory: Clear to auscultation bilaterally. GI: Soft, nontender, non-distended  MS: No edema; No deformity. Neuro:  Nonfocal  Psych: Normal affect   Labs    Chemistry Recent Labs  Lab 04/09/24 1528 04/10/24 0905 04/11/24 0220  04/12/24 0407 04/13/24 0505 04/14/24 0431  NA 136 134* 134* 134* 133* 129*  K 4.6 5.0 4.5 4.3 4.4 4.5  CL 99 99 100 97* 92* 89*  CO2 22 25 23 26 30 27   GLUCOSE 86 135* 90 103* 130* 313*  BUN 84* 85* 67* 53* 49* 48*  CREATININE 2.44* 2.66* 1.90* 1.59* 1.80* 1.60*  CALCIUM  9.0 8.6* 8.5* 8.9 8.8* 8.6*  PROT 7.2 6.4* 6.6  --   --   --   ALBUMIN  3.2* 2.9* 2.9*  --   --   --   AST 19 17 18   --   --   --   ALT 16 14 15   --   --   --   ALKPHOS 91 83 84  --   --   --   BILITOT 0.8 0.6 0.7  --   --   --   GFRNONAA 26* 23* 35* 43* 37* 43*  ANIONGAP 15 10 11 11 11 13      Hematology Recent Labs  Lab 04/11/24 0220 04/12/24 0407 04/13/24 0505  WBC 5.2 5.7 4.7  RBC 3.46* 3.84* 3.70*  HGB 10.5* 11.5* 11.1*  HCT 31.3* 34.7* 33.5*  MCV 90.5 90.4 90.5  MCH 30.3 29.9  30.0  MCHC 33.5 33.1 33.1  RDW 13.7 13.7 13.8  PLT 135* 137* 133*    Cardiac EnzymesNo results for input(s): "TROPONINI" in the last 168 hours. No results for input(s): "TROPIPOC" in the last 168 hours.   BNP Recent Labs  Lab 04/09/24 1909 04/11/24 2159  BNP 566.3* 1,178.8*     DDimer No results for input(s): "DDIMER" in the last 168 hours.   Radiology    No results found.  Cardiac Studies   See above  Patient Profile     83 y.o. male admitted with weakness and fatigue and found to have a PE.  Assessment & Plan    PE - he is doing well on eliquis . 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 I have the [employer] rep coming to help reprogram his device to attempt to sense his sinus beats and synchronously pace.This will be done this morning prior to discharge. CAD - he denies anginal symptoms.       For questions or updates, please contact [facility] Please consult [email or web address] for contact info under Cardiology/STEMI.      Signed, Manya Sells, MD  04/14/2024, 9:40 AM

## 2024-04-14 NOTE — Plan of Care (Signed)
  Problem: Coping: Goal: Ability to adjust to condition or change in health will improve Outcome: Progressing   Problem: Health Behavior/Discharge Planning: Goal: Ability to identify and utilize available resources and services will improve Outcome: Progressing   

## 2024-04-15 LAB — CULTURE, BLOOD (ROUTINE X 2)
Culture: NO GROWTH
Culture: NO GROWTH
Special Requests: ADEQUATE
Special Requests: ADEQUATE

## 2024-04-18 ENCOUNTER — Ambulatory Visit: Admitting: Orthopedic Surgery

## 2024-04-18 DIAGNOSIS — L97521 Non-pressure chronic ulcer of other part of left foot limited to breakdown of skin: Secondary | ICD-10-CM | POA: Diagnosis not present

## 2024-04-18 DIAGNOSIS — Z89439 Acquired absence of unspecified foot: Secondary | ICD-10-CM

## 2024-04-18 DIAGNOSIS — Z89511 Acquired absence of right leg below knee: Secondary | ICD-10-CM | POA: Diagnosis not present

## 2024-04-19 ENCOUNTER — Encounter: Payer: Self-pay | Admitting: Orthopedic Surgery

## 2024-04-19 NOTE — Progress Notes (Signed)
 Office Visit Note   Patient: Shane [name]           Date of Birth: January 23, 1941           MRN: [id number] Visit Date: 04/18/2024              Requested by: Suan Elm, MD [address] [place],  [place] [address] PCP: Suan Elm, MD  Chief Complaint  Patient presents with   Left Foot - Follow-up      HPI: Patient is a 83 year old gentleman who is on Eliquis .  Patient has a new abrasion over the dorsum of his ankle and a lateral ulcer over the transmetatarsal amputation.  Assessment & Plan: Visit Diagnoses:  1. History of transmetatarsal amputation of foot (HCC)   2. Non-pressure chronic ulcer of other part of left foot limited to breakdown of skin (HCC)   3. Hx of right BKA (HCC)     Plan: Patient will resume using the nitroglycerin  patch to help with the microcirculation.  Continue with protected weightbearing and resume dressing changes.  Follow-Up Instructions: Return in about 4 weeks (around 05/16/2024).   Ortho Exam  Patient is alert, oriented, no adenopathy, well-dressed, normal affect, normal respiratory effort. Examination patient has a superficial abrasion of the dorsum of the ankle which measures 1 x 4 cm.  The ulcer on the transmetatarsal amputation has healthy granulation tissue measures 2 x 2 cm.  Imaging: No results found. No images are attached to the encounter.  Labs: Lab Results  Component Value Date   HGBA1C 8.7 (H) 04/09/2024   HGBA1C 8.5 (H) 12/06/2023   HGBA1C 9.0 (H) 06/24/2023   ESRSEDRATE 84 (H) 12/29/2023   ESRSEDRATE 22 (H) 10/14/2016   CRP 3.5 (H) 12/29/2023   CRP 9.0 (H) 12/08/2023   CRP 8.3 (H) 12/09/2020   REPTSTATUS 04/15/2024 FINAL 04/09/2024   GRAMSTAIN  02/19/2021    RARE WBC PRESENT, PREDOMINANTLY PMN NO ORGANISMS SEEN    CULT  04/09/2024    NO GROWTH 5 DAYS Performed at [facility], [address], [place], [place] [address]    [facility] ESCHERICHIA COLI 02/19/2021     Lab Results  Component  Value Date   ALBUMIN  2.9 (L) 04/11/2024   ALBUMIN  2.9 (L) 04/10/2024   ALBUMIN  3.2 (L) 04/09/2024    Lab Results  Component Value Date   MG 1.6 (L) 04/09/2024   MG 1.8 12/30/2023   MG 1.9 12/29/2023   Lab Results  Component Value Date   VD25OH 32.29 12/14/2022    No results found for: "PREALBUMIN"    Latest Ref Rng & Units 04/13/2024    5:05 AM 04/12/2024    4:07 AM 04/11/2024    2:20 AM  CBC EXTENDED  WBC 4.0 - 10.5 K/uL 4.7  5.7  5.2   RBC 4.22 - 5.81 MIL/uL 3.70  3.84  3.46   Hemoglobin 13.0 - 17.0 g/dL 82.9  56.2  13.0   HCT 39.0 - 52.0 % 33.5  34.7  31.3   Platelets 150 - 400 K/uL 133  137  135      There is no height or weight on file to calculate BMI.  Orders:  No orders of the defined types were placed in this encounter.  No orders of the defined types were placed in this encounter.    Procedures: No procedures performed  Clinical Data: No additional findings.  ROS:  All other systems negative, except as noted in the HPI. Review  of Systems  Objective: Vital Signs: There were no vitals taken for this visit.  Specialty Comments:  No specialty comments available.  PMFS History: Patient Active Problem List   Diagnosis Date Noted   Acute kidney injury superimposed on stage 3a chronic kidney disease (HCC) 04/09/2024   Impaired functional mobility, balance, gait, and endurance 04/09/2024   Non-pressure chronic ulcer of other part of left foot limited to breakdown of skin (HCC) 12/27/2023   Type 2 diabetes mellitus without complication, with long-term current use of insulin  (HCC) 12/12/2023   Esophageal dysphagia 12/11/2023   COVID-19 12/11/2023   Acute on chronic hypoxic respiratory failure (HCC) 12/10/2023   Anemia 12/10/2023   Non-ST elevation (NSTEMI) myocardial infarction (HCC) 12/09/2023   Retroperitoneal bleeding 12/09/2023   History of transcatheter aortic valve replacement (TAVR) 12/09/2023   Chronic heart failure with preserved ejection  fraction (HFpEF) (HCC) 12/09/2023   Hx of CABG 12/09/2023   Atherosclerosis of native coronary artery of native heart without angina pectoris 12/09/2023   Hyperkalemia 12/07/2023   Acute on chronic congestive heart failure (HCC) 12/07/2023   Acute kidney injury (nontraumatic) (HCC) 12/06/2023   Acute on chronic combined systolic and diastolic CHF, NYHA class 1 (HCC) 06/22/2023   Acute on chronic systolic (congestive) heart failure (HCC) 06/21/2023   Intermediate stage nonexudative age-related macular degeneration of both eyes 04/25/2022   Research study patient 12/24/2021   Labile blood glucose    Pulmonary fibrosis (HCC)    Hyponatremia    S/P BKA (below knee amputation) unilateral, right (HCC)    Shortness of breath    Atrial fibrillation (HCC)    Postoperative pain    Atherosclerosis of native arteries of extremities with gangrene, right leg (HCC)    Hardware complicating wound infection (HCC)    Abscess of bursa of right ankle 02/15/2021   Endocarditis 01/19/2021   PAD (peripheral artery disease) (HCC) 12/29/2020   Chronic systolic CHF (congestive heart failure) (HCC) 12/22/2020   History of COVID-19 12/22/2020   SSS (sick sinus syndrome) (HCC) 12/22/2020   Acute bacterial endocarditis    MSSA bacteremia 12/09/2020   Diabetic foot ulcer (HCC) 12/09/2020   Foot drop, right foot    Generalized weakness 12/08/2020   Atrial fibrillation, chronic (HCC) 12/08/2020   Elevated troponin level not due myocardial infarction 12/08/2020   Adrenal insufficiency (HCC) 11/14/2020   Orthostatic hypotension 11/14/2020   Physical deconditioning 11/14/2020   Difficulty sleeping 11/07/2020   Right ventricular dysfunction 11/05/2020   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/04/2020   Hearing loss 11/03/2020   Cardiac device in situ 09/09/2020   COVID-19 virus infection 07/18/2020   Coronary artery disease involving native heart without angina pectoris/ S/P CABG x3 07/10/2020   Chronic kidney disease, stage 3a (HCC)  03/29/2020   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/28/2020   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) 03/16/2020   Right epiretinal membrane 03/16/2020   Posterior vitreous detachment of right eye 03/16/2020   Aortic stenosis, moderate 03/12/2020   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   Near syncope 04/27/2018   Hypoglycemia due to insulin  01/06/2018   Essential hypertension 01/06/2018   Syncope and collapse 01/05/2018   Encounter for therapeutic drug monitoring 06/29/2017   OSA on CPAP 05/05/2017   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HCC) 01/05/2017   Abnormal chest x-ray 10/12/2016   Benign neoplasm of colon 01/14/2013   Gastro-esophageal reflux disease without esophagitis 11/24/2009   Past Medical History:  Diagnosis Date  Acute blood loss anemia    Anxiety    AV block, Mobitz 1    Cataract    Chronic kidney disease    d/t DM   CKD (chronic kidney disease), stage III (HCC)    COVID-19 virus infection 07/18/2020   Last Assessment & Plan:   Formatting of this note might be different from the original.  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 10/2019 Echo: EF 55-60%, Gr2 DD. Nl RV.    Neuromuscular disorder (HCC)    Neuropathy associated with endocrine disorder (HCC)    Nonobstructive CAD (coronary artery disease)    a. 2012 Cath: mod, nonobs dzs; b. 10/2016 MV: EF 60%, no ischemia.   OSA on CPAP 05/05/2017   Unattended Home Sleep Test 7/2/813-AHI 38.6/hour,  desaturation to 64%, body weight 261 pounds   PONV (postoperative nausea and vomiting)    Postoperative anemia due to acute blood loss 11/07/2020   Postoperative hemorrhagic shock 03/20/2021   Sleep apnea     uses cpap asked to bring mask and tubing    Family History  Problem Relation Age of Onset   Diabetes Mellitus II Mother    Emphysema Father 37   Heart attack Father    Colon cancer Neg Hx    Esophageal cancer Neg Hx    Rectal cancer Neg Hx    Stomach cancer Neg Hx     Past Surgical History:  Procedure Laterality Date   ABDOMINAL AORTOGRAM W/LOWER EXTREMITY N/A 12/10/2020   Procedure: ABDOMINAL AORTOGRAM W/LOWER EXTREMITY;  Surgeon: Carlene Che, MD;  Location: [facility] INVASIVE CV LAB;  Service: Cardiovascular;  Laterality: N/A;   AMPUTATION Right 01/22/2021   Procedure: 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;  Surgeon: Timothy Ford, MD;  Location: [facility] OR;  Service: Orthopedics;  Laterality: Right;   AMPUTATION Right 03/17/2021   Procedure: RIGHT BELOW KNEE AMPUTATION;  Surgeon: Timothy Ford, MD;  Location: [facility] OR;  Service: Orthopedics;  Laterality: Right;   ANKLE FUSION Right 01/22/2021   Procedure: RIGHT FOOT TIBIOCALCANEAL FUSION;  Surgeon: Timothy Ford, MD;  Location: [facility] OR;  Service: Orthopedics;  Laterality: Right;   ANTERIOR FUSION CERVICAL SPINE  2012   CARDIAC CATHETERIZATION  2011   CARDIAC CATHETERIZATION N/A 11/09/2016   Procedure: Right Heart Cath;  Surgeon: Arty Binning, MD;  Location: [facility] INVASIVE CV LAB;  Service: Cardiovascular;  Laterality: N/A;   carpel tunnel     left wrist   CATARACT EXTRACTION     CATARACT EXTRACTION W/ INTRAOCULAR LENS  IMPLANT, BILATERAL  2013   CERVICAL LAMINECTOMY  2012   COLONOSCOPY N/A 01/14/2013   Procedure: COLONOSCOPY;  Surgeon: Tobin Forts, MD;  Location: [facility] ENDOSCOPY;  Service: Endoscopy;  Laterality: N/A;   CORONARY ARTERY BYPASS GRAFT  11/04/2020   LIMA-LAD, SVG-OM1, SVG-PDA (Dr Silver Dross [facility]) dc 11/18/2020   EYE SURGERY     I & D  EXTREMITY Right 02/19/2021   Procedure:  RIGHT ANKLE DEBRIDEMENT AND PLACEMENT ANTIBIOTIC BEADS;  Surgeon: Timothy Ford, MD;  Location: [facility] OR;  Service: Orthopedics;  Laterality: Right;   KNEE SURGERY  1998   left   LEFT HEART CATH AND CORONARY ANGIOGRAPHY N/A 07/10/2020   Procedure: LEFT HEART CATH AND CORONARY ANGIOGRAPHY;  Surgeon: Arnoldo Lapping, MD;  Location: [facility] INVASIVE CV LAB;  Service: Cardiovascular;  Laterality: N/A;   LUMBAR LAMINECTOMY  2003   LUNG BIOPSY Left 12/26/2016   Procedure: LUNG BIOPSY;  Surgeon: Zelphia Higashi, MD;  Location: [facility] OR;  Service: Thoracic;  Laterality: Left;   PACEMAKER IMPLANT N/A 03/30/2020   Procedure: PACEMAKER IMPLANT;  Surgeon: Tammie Fall, MD;  Location: [facility] LAB;  Service: Cardiovascular;  Laterality: N/A;   PERIPHERAL VASCULAR INTERVENTION Right 12/10/2020   Procedure: PERIPHERAL VASCULAR INTERVENTION;  Surgeon: Carlene Che, MD;  Location: [facility];  Service: Cardiovascular;  Laterality: Right;  SFA   POSTERIOR FUSION CERVICAL SPINE  2012   PPM GENERATOR REMOVAL N/A 12/14/2020   Procedure: PPM GENERATOR REMOVAL;  Surgeon: Tammie Fall, MD;  Location: [facility] LAB;  Service: Cardiovascular;  Laterality: N/A;   TEE WITHOUT CARDIOVERSION N/A 12/11/2020   Procedure: TRANSESOPHAGEAL ECHOCARDIOGRAM (TEE);  Surgeon: Harrold Lincoln, MD;  Location: [facility] ENDOSCOPY;  Service: Cardiovascular;  Laterality: N/A;   TRIGGER FINGER RELEASE  2011   4th finger left hand   VIDEO ASSISTED THORACOSCOPY Left 12/26/2016   Procedure: VIDEO ASSISTED THORACOSCOPY;  Surgeon: Zelphia Higashi, MD;  Location: [facility] OR;  Service: Thoracic;  Laterality: Left;   VIDEO BRONCHOSCOPY N/A 12/26/2016   Procedure: VIDEO BRONCHOSCOPY;  Surgeon: Zelphia Higashi, MD;  Location: [facility] OR;  Service: Thoracic;  Laterality: N/A;   Social History   Occupational History   Not on file  Tobacco Use   Smoking status: Never   Smokeless tobacco: Never   Vaping Use   Vaping status: Never Used  Substance and Sexual Activity   Alcohol use: No   Drug use: No   Sexual activity: Not Currently

## 2024-05-06 ENCOUNTER — Telehealth: Payer: Self-pay | Admitting: *Deleted

## 2024-05-06 NOTE — Telephone Encounter (Signed)
 Yes bring him in. I know them well. He has been our loyal IPF patient for years

## 2024-05-06 NOTE — Telephone Encounter (Signed)
 Copied from CRM [id number]. Topic: Appointments - Appointment Scheduling >> May 06, 2024 11:12 AM Crist Dominion wrote: Patients wife is requesting to schedule an appointment with Dr. Bertrum Brodie as the patient has been in and out of the hospital 3 different times since January and they have had to cancel previous appointments due to the hospital stays. Patients wife was advised that there are no current openings but she is requesting the clinic staff to ask Dr. Bertrum Brodie if he can make an exception, and she declined to schedule with a NP Please call wife Devra Fontana at  [phone]  I called and spoke with the pt's spouse   She is asking to have pt worked in with MR this wk  He has had increased noct cough- white sputum  MR- you have a held slot on 05/09/24 at 2:15- it's only 15 min Can we work him in then? He refused APP.

## 2024-05-07 ENCOUNTER — Telehealth: Payer: Self-pay | Admitting: Orthopedic Surgery

## 2024-05-07 NOTE — Telephone Encounter (Signed)
 Appt made to see [facility] tomorrow.

## 2024-05-07 NOTE — Telephone Encounter (Signed)
 I called and spoke with the pt's spouse and scheduled for appt with MR for 05/09/24.

## 2024-05-07 NOTE — Telephone Encounter (Signed)
 April from [facility] called and said that the patient wound is larger and per [place] she said to let you know that they can come tomorrow whenever they can get here. CB#[phone]

## 2024-05-08 ENCOUNTER — Ambulatory Visit: Admitting: Family

## 2024-05-08 ENCOUNTER — Encounter: Payer: Self-pay | Admitting: Family

## 2024-05-08 ENCOUNTER — Telehealth: Payer: Self-pay

## 2024-05-08 ENCOUNTER — Telehealth: Payer: Self-pay | Admitting: Family

## 2024-05-08 DIAGNOSIS — Z89439 Acquired absence of unspecified foot: Secondary | ICD-10-CM

## 2024-05-08 DIAGNOSIS — L97521 Non-pressure chronic ulcer of other part of left foot limited to breakdown of skin: Secondary | ICD-10-CM

## 2024-05-08 NOTE — Progress Notes (Signed)
 Office Visit Note   Patient: Shane [name]           Date of Birth: January 19, 1941           MRN: [id number] Visit Date: 05/08/2024              Requested by: Suan Elm, MD [address] [place],  [place] [address] PCP: Suan Elm, MD  Chief Complaint  Patient presents with   Left Foot - Wound Check      HPI: The patient is an 83 year old gentleman who is seen today as a walk-in for concern of worsening to ulcers left foot.  Home health was concerned he may have an infection had noticed increased yellow drainage and maceration a few days ago  History of transmetatarsal amputation on the left, is currently on Eliquis .  He has been using his Vive compression stocking over dressings to the 2 wounds to his left foot  No fever no chills  Assessment & Plan: Visit Diagnoses: No diagnosis found.  Plan: No sign of infection today.  Did reinforce proper wear of the 5 compression stocking.  The lateral ulcer with depth will be packed open with an alginate dressing will wear the sock with direct skin contact over the dorsum of the foot and ankle  Given an order for new compression stockings  Follow-Up Instructions: No follow-ups on file.   Ortho Exam  Patient is alert, oriented, no adenopathy, well-dressed, normal affect, normal respiratory effort. On examination left lower extremity there are brawny skin color changes and chronic edema 1+ pitting.  There is no erythema no weeping over the dorsum of his foot and ankle he has open ulceration this is 3 cm x 1.5 centimeters with 3 mm of depth there is 50% fibrinous exudative tissue.  Over the lateral forefoot there is another ulcer which is larger than last viewing today this is measuring 2 cm x 2 cm with 5 mm of depth after debridement with gauze there is bleeding granulation tissue scant surrounding maceration no odor    Imaging: No results found. No images are attached to the encounter.  Labs: Lab Results   Component Value Date   HGBA1C 8.7 (H) 04/09/2024   HGBA1C 8.5 (H) 12/06/2023   HGBA1C 9.0 (H) 06/24/2023   ESRSEDRATE 84 (H) 12/29/2023   ESRSEDRATE 22 (H) 10/14/2016   CRP 3.5 (H) 12/29/2023   CRP 9.0 (H) 12/08/2023   CRP 8.3 (H) 12/09/2020   REPTSTATUS 04/15/2024 FINAL 04/09/2024   GRAMSTAIN  02/19/2021    RARE WBC PRESENT, PREDOMINANTLY PMN NO ORGANISMS SEEN    CULT  04/09/2024    NO GROWTH 5 DAYS Performed at [facility], [address] [place], [place] [address]    [facility] ESCHERICHIA COLI 02/19/2021     Lab Results  Component Value Date   ALBUMIN  2.9 (L) 04/11/2024   ALBUMIN  2.9 (L) 04/10/2024   ALBUMIN  3.2 (L) 04/09/2024    Lab Results  Component Value Date   MG 1.6 (L) 04/09/2024   MG 1.8 12/30/2023   MG 1.9 12/29/2023   Lab Results  Component Value Date   VD25OH 32.29 12/14/2022    No results found for: "PREALBUMIN"    Latest Ref Rng & Units 04/13/2024    5:05 AM 04/12/2024    4:07 AM 04/11/2024    2:20 AM  CBC EXTENDED  WBC 4.0 - 10.5 K/uL 4.7  5.7  5.2   RBC 4.22 - 5.81 MIL/uL 3.70  3.84  3.46   Hemoglobin 13.0 - 17.0 g/dL 91.4  78.2  95.6   HCT 39.0 - 52.0 % 33.5  34.7  31.3   Platelets 150 - 400 K/uL 133  137  135      There is no height or weight on file to calculate BMI.  Orders:  No orders of the defined types were placed in this encounter.  No orders of the defined types were placed in this encounter.    Procedures: No procedures performed  Clinical Data: No additional findings.  ROS:  All other systems negative, except as noted in the HPI. Review of Systems  Objective: Vital Signs: There were no vitals taken for this visit.  Specialty Comments:  No specialty comments available.  PMFS History: Patient Active Problem List   Diagnosis Date Noted   Acute kidney injury superimposed on stage 3a chronic kidney disease (HCC) 04/09/2024   Impaired functional mobility, balance, gait, and endurance 04/09/2024    Non-pressure chronic ulcer of other part of left foot limited to breakdown of skin (HCC) 12/27/2023   Type 2 diabetes mellitus without complication, with long-term current use of insulin  (HCC) 12/12/2023   Esophageal dysphagia 12/11/2023   COVID-19 12/11/2023   Acute on chronic hypoxic respiratory failure (HCC) 12/10/2023   Anemia 12/10/2023   Non-ST elevation (NSTEMI) myocardial infarction (HCC) 12/09/2023   Retroperitoneal bleeding 12/09/2023   History of transcatheter aortic valve replacement (TAVR) 12/09/2023   Chronic heart failure with preserved ejection fraction (HFpEF) (HCC) 12/09/2023   Hx of CABG 12/09/2023   Atherosclerosis of native coronary artery of native heart without angina pectoris 12/09/2023   Hyperkalemia 12/07/2023   Acute on chronic congestive heart failure (HCC) 12/07/2023   Acute kidney injury (nontraumatic) (HCC) 12/06/2023   Acute on chronic combined systolic and diastolic CHF, NYHA class 1 (HCC) 06/22/2023   Acute on chronic systolic (congestive) heart failure (HCC) 06/21/2023   Intermediate stage nonexudative age-related macular degeneration of both eyes 04/25/2022   Research study patient 12/24/2021   Labile blood glucose    Pulmonary fibrosis (HCC)    Hyponatremia    S/P BKA (below knee amputation) unilateral, right (HCC)    Shortness of breath    Atrial fibrillation (HCC)    Postoperative pain    Atherosclerosis of native arteries of extremities with gangrene, right leg (HCC)    Hardware complicating wound infection (HCC)    Abscess of bursa of right ankle 02/15/2021   Endocarditis 01/19/2021   PAD (peripheral artery disease) (HCC) 12/29/2020   Chronic systolic CHF (congestive heart failure) (HCC) 12/22/2020   History of COVID-19 12/22/2020   SSS (sick sinus syndrome) (HCC) 12/22/2020   Acute bacterial endocarditis    MSSA bacteremia 12/09/2020   Diabetic foot ulcer (HCC) 12/09/2020   Foot drop, right foot    Generalized weakness 12/08/2020    Atrial fibrillation, chronic (HCC) 12/08/2020   Elevated troponin level not due myocardial infarction 12/08/2020   Adrenal insufficiency (HCC) 11/14/2020   Orthostatic hypotension 11/14/2020   Physical deconditioning 11/14/2020   Difficulty sleeping 11/07/2020   Right ventricular dysfunction 11/05/2020   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/04/2020   Hearing loss 11/03/2020   Cardiac device in situ 09/09/2020   COVID-19 virus infection 07/18/2020   Coronary artery disease involving native heart without angina pectoris/ S/P CABG x3 07/10/2020   Chronic kidney disease, stage 3a (HCC) 03/29/2020   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/28/2020   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 without  macular edema (HCC) 03/16/2020   Right epiretinal membrane 03/16/2020   Posterior vitreous detachment of right eye 03/16/2020   Aortic stenosis, moderate 03/12/2020   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   Near syncope 04/27/2018   Hypoglycemia due to insulin  01/06/2018   Essential hypertension 01/06/2018   Syncope and collapse 01/05/2018   Encounter for therapeutic drug monitoring 06/29/2017   OSA on CPAP 05/05/2017   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HCC) 01/05/2017   Abnormal chest x-ray 10/12/2016   Benign neoplasm of colon 01/14/2013   Gastro-esophageal reflux disease without esophagitis 11/24/2009   Past Medical History:  Diagnosis Date   Acute blood loss anemia    Anxiety    AV block, Mobitz 1    Cataract    Chronic kidney disease    d/t DM   CKD (chronic kidney disease), stage III (HCC)    COVID-19 virus infection 07/18/2020   Last Assessment & Plan:   Formatting of this note might be different from the original.  Immunocompromise high risk patient  -ID consulted for further therapies and will administer regeneron as OP tomorrow   -Decadron  discontinued as patient is off O2     Depression    Diabetes mellitus    Vgo disposal insulin  bolus  simular to insulin  pump    Dyspnea    GERD (gastroesophageal reflux disease)    History of kidney stones    passed   Hyperlipidemia    Hypertension    Idiopathic pulmonary fibrosis (HCC) 11/2016   ILD (interstitial lung disease) (HCC)    Moderate aortic stenosis    a. 10/2019 Echo: EF 55-60%, Gr2 DD. Nl RV.    Neuromuscular disorder (HCC)    Neuropathy associated with endocrine disorder (HCC)    Nonobstructive CAD (coronary artery disease)    a. 2012 Cath: mod, nonobs dzs; b. 10/2016 MV: EF 60%, no ischemia.   OSA on CPAP 05/05/2017   Unattended Home Sleep Test 7/2/813-AHI 38.6/hour, desaturation to 64%, body weight 261 pounds   PONV (postoperative nausea and vomiting)    Postoperative anemia due to acute blood loss 11/07/2020   Postoperative hemorrhagic shock 03/20/2021   Sleep apnea     uses cpap asked to bring mask and tubing    Family History  Problem Relation Age of Onset   Diabetes Mellitus II Mother    Emphysema Father 51   Heart attack Father    Colon cancer Neg Hx    Esophageal cancer Neg Hx    Rectal cancer Neg Hx    Stomach cancer Neg Hx     Past Surgical History:  Procedure Laterality Date   ABDOMINAL AORTOGRAM W/LOWER EXTREMITY N/A 12/10/2020   Procedure: ABDOMINAL AORTOGRAM W/LOWER EXTREMITY;  Surgeon: Carlene Che, MD;  Location: [facility];  Service: Cardiovascular;  Laterality: N/A;   AMPUTATION Right 01/22/2021   Procedure: 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;  Surgeon: Timothy Ford, MD;  Location: [facility] OR;  Service: Orthopedics;  Laterality: Right;   AMPUTATION Right 03/17/2021   Procedure: RIGHT BELOW KNEE AMPUTATION;  Surgeon: Timothy Ford, MD;  Location: [facility] OR;  Service: Orthopedics;  Laterality: Right;   ANKLE FUSION Right 01/22/2021   Procedure: RIGHT FOOT TIBIOCALCANEAL FUSION;  Surgeon: Timothy Ford, MD;  Location[facility] OR;  Service: Orthopedics;  Laterality: Right;   ANTERIOR FUSION CERVICAL SPINE  2012   CARDIAC CATHETERIZATION  2011   CARDIAC CATHETERIZATION N/A 11/09/2016    Procedure: Right Heart Cath;  Surgeon: Ace Abu  Arrie Bienenstock, MD;  Location: [facility];  Service: Cardiovascular;  Laterality: N/A;   carpel tunnel     left wrist   CATARACT EXTRACTION     CATARACT EXTRACTION W/ INTRAOCULAR LENS  IMPLANT, BILATERAL  2013   CERVICAL LAMINECTOMY  2012   COLONOSCOPY N/A 01/14/2013   Procedure: COLONOSCOPY;  Surgeon: Tobin Forts, MD;  Location: [facility];  Service: Endoscopy;  Laterality: N/A;   CORONARY ARTERY BYPASS GRAFT  11/04/2020   LIMA-LAD, SVG-OM1, SVG-PDA (Dr Silver Dross [facility]) dc 11/18/2020   EYE SURGERY     I & D EXTREMITY Right 02/19/2021   Procedure: RIGHT ANKLE DEBRIDEMENT AND PLACEMENT ANTIBIOTIC BEADS;  Surgeon: Timothy Ford, MD;  Location: [facility] OR;  Service: Orthopedics;  Laterality: Right;   KNEE SURGERY  1998   left   LEFT HEART CATH AND CORONARY ANGIOGRAPHY N/A 07/10/2020   Procedure: LEFT HEART CATH AND CORONARY ANGIOGRAPHY;  Surgeon: Arnoldo Lapping, MD;  Location: [facility] INVASIVE CV LAB;  Service: Cardiovascular;  Laterality: N/A;   LUMBAR LAMINECTOMY  2003   LUNG BIOPSY Left 12/26/2016   Procedure: LUNG BIOPSY;  Surgeon: Zelphia Higashi, MD;  Location: [facility] OR;  Service: Thoracic;  Laterality: Left;   PACEMAKER IMPLANT N/A 03/30/2020   Procedure: PACEMAKER IMPLANT;  Surgeon: Tammie Fall, MD;  Location: [facility];  Service: Cardiovascular;  Laterality: N/A;   PERIPHERAL VASCULAR INTERVENTION Right 12/10/2020   Procedure: PERIPHERAL VASCULAR INTERVENTION;  Surgeon: Carlene Che, MD;  Location: [facility];  Service: Cardiovascular;  Laterality: Right;  SFA   POSTERIOR FUSION CERVICAL SPINE  2012   PPM GENERATOR REMOVAL N/A 12/14/2020   Procedure: PPM GENERATOR REMOVAL;  Surgeon: Tammie Fall, MD;  Location: [facility];  Service: Cardiovascular;  Laterality: N/A;   TEE WITHOUT CARDIOVERSION N/A 12/11/2020   Procedure: TRANSESOPHAGEAL ECHOCARDIOGRAM (TEE);  Surgeon: Harrold Lincoln, MD;  Location: [facility]  ENDOSCOPY;  Service: Cardiovascular;  Laterality: N/A;   TRIGGER FINGER RELEASE  2011   4th finger left hand   VIDEO ASSISTED THORACOSCOPY Left 12/26/2016   Procedure: VIDEO ASSISTED THORACOSCOPY;  Surgeon: Zelphia Higashi, MD;  Location: [facility] OR;  Service: Thoracic;  Laterality: Left;   VIDEO BRONCHOSCOPY N/A 12/26/2016   Procedure: VIDEO BRONCHOSCOPY;  Surgeon: Zelphia Higashi, MD;  Location: [facility] OR;  Service: Thoracic;  Laterality: N/A;   Social History   Occupational History   Not on file  Tobacco Use   Smoking status: Never   Smokeless tobacco: Never  Vaping Use   Vaping status: Never Used  Substance and Sexual Activity   Alcohol use: No   Drug use: No   Sexual activity: Not Currently

## 2024-05-08 NOTE — Telephone Encounter (Signed)
 LMTCB

## 2024-05-08 NOTE — Telephone Encounter (Signed)
 SW pt's wife and let her know that okay for once a day dressing changes if more drainage coming through the dressing can do twice a day. Also per [place], he should minimize his WTB.

## 2024-05-08 NOTE — Telephone Encounter (Signed)
 Pt's wife called requesting a call back. Pt and wife has a few more medical questions. First question was [place] Dales gave dressing for pt's wound. Does he need to use daily or more than daily. 2nd questing can pt put presser on foot/ standing. Please call pt's wife at [phone].

## 2024-05-08 NOTE — Telephone Encounter (Signed)
 Shane [name], with [facility] would like a call back to clarify orders for patient.  CB# [phone].  Please advise.  Thank you.

## 2024-05-09 ENCOUNTER — Encounter: Payer: Self-pay | Admitting: Internal Medicine

## 2024-05-09 ENCOUNTER — Ambulatory Visit: Admitting: Internal Medicine

## 2024-05-09 VITALS — BP 125/74 | HR 82 | Ht 72.0 in | Wt 232.8 lb

## 2024-05-09 DIAGNOSIS — Z86711 Personal history of pulmonary embolism: Secondary | ICD-10-CM

## 2024-05-09 DIAGNOSIS — R053 Chronic cough: Secondary | ICD-10-CM | POA: Diagnosis not present

## 2024-05-09 DIAGNOSIS — Z8616 Personal history of COVID-19: Secondary | ICD-10-CM

## 2024-05-09 DIAGNOSIS — J84112 Idiopathic pulmonary fibrosis: Secondary | ICD-10-CM | POA: Diagnosis not present

## 2024-05-09 DIAGNOSIS — R0609 Other forms of dyspnea: Secondary | ICD-10-CM | POA: Diagnosis not present

## 2024-05-09 DIAGNOSIS — I5022 Chronic systolic (congestive) heart failure: Secondary | ICD-10-CM

## 2024-05-09 NOTE — Telephone Encounter (Signed)
 I called and lm on vm to advise that the pt was in the office yesterday and that he was advised to pack open the wound with alginate dressing and to wear his compression sock. To cb with questions.

## 2024-05-09 NOTE — Progress Notes (Signed)
 OV 05/09/2024  Subjective:  Patient ID: Shane [name], male , DOB: Oct 12, 1941 , age 83 y.o. , MRN: [id number] , ADDRESS: [address] [place] [address] PCP Suan Elm, MD Patient Care Team: Suan Elm, MD as PCP - General (Internal Medicine) Suan Elm, MD as PCP - Internal Medicine (Internal Medicine) Audery Blazing Deannie Fabian, MD as PCP - Cardiology (Cardiology) Tammie Fall, MD as PCP - Electrophysiology (Cardiology) Tita Form, MD as Consulting Physician (Internal Medicine)  This Provider for this visit: Treatment Team:  Attending Provider: Maire Scot, MD    05/09/2024 -   Chief Complaint  Patient presents with   Follow-up    F/u ct     HPI [facility] 83 y.o. 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  Not seen him in a year.  In January 2020 for he had COVID-19 and associated with this he also had retroperitoneal bleed and then he had to have CRRT/hemodialysis for a few days.  He recovered from that.  He was in rehab.  Then he recovered from that in May 2025 had VQ scan positivity for pulmonary embolism and admitted with that syncope.  He is also recovered from that.  His anemia is nearly resolved.  His kidney function shows chronic kidney disease.  He is stable but he says that since the COVID he is more short of breath even with minimal exertion.  He does not have portable oxygen.  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  He is also having significant cough.  Even minimal exertion makes him short of breath.  His last high-resolution CT chest was April 2024.  Current labs reviewed.  Wife Devra Fontana is really worried about this.  Med review shows he is on Eliquis  for pulmonary embolism.  May 2025 echocardiogram shows drop in ejection fraction to 35-40% [the year prior he was 50%].  He is also on  lisinopril . SYMPTOM SCALE - ILD 05/09/2024  Current weight   O2 use Using only at night  Shortness of Breath 0 -> 5 scale with 5 being worst (score 6 If unable to do)  At rest 1  Simple tasks - showers, clothes change, eating, shaving 2  Household (dishes, doing bed, laundry) 3  Shopping 1  Walking level at own pace 5  Walking up Stairs 5  Total (30-36) Dyspnea Score 17  How bad is your cough? 4  How bad is your fatigue 5  How bad is nausea 0  How bad is vomiting?  0  How bad is diarrhea? 0  How bad is anxiety? 1  How bad is depression 1  Any chronic pain - if so where and how bad 0       PFT     Latest Ref Rng & Units 09/23/2022   12:29 PM 09/21/2018   12:08 PM 05/23/2018   10:37 AM 12/25/2017    9:01 AM 10/04/2017    9:35 AM 04/06/2017   10:13 AM 10/14/2016    8:19 AM  PFT Results  FVC-Pre L 2.27   2.89  P 2.88  2.94  2.94  3.00   FVC-Predicted Pre % 52   61  P 61  62  62  63   FVC-Post L       3.00   FVC-Predicted Post %  63   Pre FEV1/FVC % % 89   91  P 86  92  91  90   Post FEV1/FCV % %       92   FEV1-Pre L 2.01   2.62  P 2.47  2.69  2.69  2.70   FEV1-Predicted Pre % 65   77  P 72  79  78  78   FEV1-Post L       2.75   DLCO uncorrected ml/min/mmHg 11.84  14.95  17.13  P 14.56  15.04  16.43  16.82   DLCO UNC% % 45  42  47  P 40  41  45  46   DLCO corrected ml/min/mmHg 11.84  15.26  17.33  P 15.57  15.31  19.35    DLCO COR %Predicted % 45  43  47  P 42  42  53    DLVA Predicted % 97  83  91  P 83  79  99  81   TLC L       4.62   TLC % Predicted %       60   RV % Predicted %       60     P Preliminary result       LAB RESULTS last 96 hours No results found.       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   reports that he has never smoked. He has been exposed to tobacco smoke. He has never used smokeless tobacco.  Past Surgical History:  Procedure Laterality Date   ABDOMINAL AORTOGRAM W/LOWER EXTREMITY N/A 12/10/2020   Procedure: ABDOMINAL AORTOGRAM W/LOWER EXTREMITY;  Surgeon: Carlene Che, MD;  Location: [facility] INVASIVE CV LAB;  Service: Cardiovascular;  Laterality: N/A;   AMPUTATION Right 01/22/2021   Procedure: 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;  Surgeon: Timothy Ford, MD;  Location: [facility] OR;  Service: Orthopedics;  Laterality: Right;   AMPUTATION Right 03/17/2021   Procedure: RIGHT BELOW KNEE AMPUTATION;  Surgeon: Timothy Ford, MD;  Location: [facility] OR;  Service: Orthopedics;  Laterality: Right;   ANKLE FUSION Right 01/22/2021   Procedure: RIGHT FOOT TIBIOCALCANEAL FUSION;  Surgeon: Timothy Ford, MD;  Location: [facility] OR;  Service: Orthopedics;  Laterality: Right;   ANTERIOR FUSION CERVICAL SPINE  2012   CARDIAC CATHETERIZATION  2011   CARDIAC CATHETERIZATION N/A 11/09/2016   Procedure: Right Heart Cath;  Surgeon: Arty Binning, MD;  Location: [facility] INVASIVE CV LAB;  Service: Cardiovascular;  Laterality: N/A;   carpel tunnel     left wrist   CATARACT EXTRACTION     CATARACT EXTRACTION W/ INTRAOCULAR LENS  IMPLANT, BILATERAL  2013   CERVICAL LAMINECTOMY  2012   COLONOSCOPY N/A 01/14/2013   Procedure: COLONOSCOPY;  Surgeon: Tobin Forts, MD;  Location: [facility] ENDOSCOPY;  Service: Endoscopy;  Laterality: N/A;   CORONARY ARTERY BYPASS  GRAFT  11/04/2020   LIMA-LAD, SVG-OM1, SVG-PDA (Dr Silver Dross  [facility]) dc 11/18/2020   EYE SURGERY     I & D EXTREMITY Right 02/19/2021   Procedure: RIGHT ANKLE DEBRIDEMENT AND PLACEMENT ANTIBIOTIC BEADS;  Surgeon: Timothy Ford, MD;  Location: [facility] OR;  Service: Orthopedics;  Laterality: Right;   KNEE SURGERY  1998   left   LEFT HEART CATH AND CORONARY ANGIOGRAPHY N/A 07/10/2020   Procedure: LEFT HEART CATH AND CORONARY ANGIOGRAPHY;  Surgeon: Arnoldo Lapping, MD;  Location: [facility] INVASIVE CV LAB;  Service: Cardiovascular;  Laterality: N/A;   LUMBAR LAMINECTOMY  2003   LUNG BIOPSY Left 12/26/2016   Procedure: LUNG BIOPSY;  Surgeon: Zelphia Higashi, MD;  Location: [facility] OR;  Service: Thoracic;  Laterality: Left;   PACEMAKER IMPLANT N/A 03/30/2020   Procedure: PACEMAKER IMPLANT;  Surgeon: Tammie Fall, MD;  Location: [facility] INVASIVE CV LAB;  Service: Cardiovascular;  Laterality: N/A;   PERIPHERAL VASCULAR INTERVENTION Right 12/10/2020   Procedure: PERIPHERAL VASCULAR INTERVENTION;  Surgeon: Carlene Che, MD;  Location: [facility]VAS[facility] LAB;  Service: Cardiovascular;  Laterality: Right;  SFA   POSTERIOR FUSION CERVICAL SPINE  2012   PPM GENERATOR REMOVAL N/A 12/14/2020   Procedure: PPM GENERATOR REMOVAL;  Surgeon: Tammie Fall, MD;  Location: [facility]VAS[facility] CV LAB;  Service: Cardiovascular;  Laterality: N/A;   TEE WITHOUT CARDIOVERSION N/A 12/11/2020   Procedure: TRANSESOPHAGEAL ECHOCARDIOGRAM (TEE);  Surgeon: Harrold Lincoln, MD;  Location: [facility] ENDOSCOPY;  Service: Cardiovascular;  Laterality: N/A;   TRIGGER FINGER RELEASE  2011   4th finger left hand   VIDEO ASSISTED THORACOSCOPY Left 12/26/2016   Procedure: VIDEO ASSISTED THORACOSCOPY;  Surgeon: Zelphia Higashi, MD;  Location: [facility] OR;  Service: Thoracic;  Laterality: Left;   VIDEO BRONCHOSCOPY N/A 12/26/2016   Procedure: VIDEO BRONCHOSCOPY;  Surgeon: Zelphia Higashi, MD;  Location: [facility] OR;  Service: Thoracic;  Laterality: N/A;    Allergies   Allergen Reactions   Codeine Hives and Itching   Ofev  [Nintedanib ] Diarrhea    SEVERE DIARRHEA   Pirfenidone  Diarrhea and Other (See Comments)    Esbriet  (Pirfenidone ) causes elevated LFTs. D/C on 06/14/17 and SEVERE DIARRHEA     Immunization History  Administered Date(s) Administered   Fluzone Influenza virus vaccine,trivalent (IIV3), split virus 10/07/2010, 08/31/2011, 10/12/2012, 12/05/2012, 08/29/2013, 10/31/2014   Influenza Split 08/14/2017   Influenza, High Dose Seasonal PF 08/05/2016, 09/01/2016, 09/02/2019, 09/05/2022   Influenza, Mdck, Trivalent,PF 6+ MOS(egg free) 08/23/2017   Influenza, Quadrivalent, Recombinant, Inj, Pf 08/29/2018, 08/23/2019, 09/19/2020, 08/28/2023   Influenza,inj,Quad PF,6+ Mos 10/31/2014, 08/29/2015   Influenza-Unspecified 10/13/2020   PFIZER Comirnaty(Gray Top)Covid-19 Tri-Sucrose Vaccine 09/27/2020   PFIZER(Purple Top)SARS-COV-2 Vaccination 12/28/2019, 01/18/2020, 09/27/2020   Pneumococcal Conjugate-13 01/14/2015   Pneumococcal Polysaccharide-23 11/26/2009, 12/05/2014   Td (Adult) 12/13/2013   Td (Adult),5 Lf Tetanus Toxid, Preservative Free 10/07/2010   Zoster, Live 01/14/2011    Family History  Problem Relation Age of Onset   Diabetes Mellitus II Mother    Emphysema Father 83   Heart attack Father    Colon cancer Neg Hx    Esophageal cancer Neg Hx    Rectal cancer Neg Hx    Stomach cancer Neg Hx      Current Outpatient Medications:    Acetaminophen  (TYLENOL  PO), Take 2 tablets by mouth as needed. May take 2 tablet by mouth 3 hours after taking the trazodone  75mg  prn., Disp: , Rfl:    albuterol  (PROVENTIL ) (2.5 MG/3ML) 0.083% nebulizer solution, Take 2.5 mg by nebulization 2 (two) times daily as needed  for wheezing or shortness of breath., Disp: , Rfl:    albuterol  (VENTOLIN  HFA) 108 (90 Base) MCG/ACT inhaler, Inhale 2 puffs into the lungs every 4 (four) hours as needed for wheezing or shortness of breath. (Patient taking differently:  Inhale 3 puffs into the lungs every 4 (four) hours as needed for wheezing or shortness of breath.), Disp: 6.7 g, Rfl: 0   APIXABAN  (ELIQUIS ) VTE STARTER PACK (10MG  AND 5MG ), Take as directed on package: start with two-5mg  tablets twice daily for 7 days. On day 8, switch to one-5mg  tablet twice daily., Disp: 74 each, Rfl: 0   Ascorbic Acid  (VITAMIN C  WITH ROSE HIPS) 500 MG tablet, Take 1 tablet (500 mg total) by mouth daily. (Patient taking differently: Take 500 mg by mouth in the morning.), Disp: 30 tablet, Rfl: 0   atorvastatin  (LIPITOR ) 80 MG tablet, TAKE 1 TABLET BY MOUTH EVERY DAY (Patient taking differently: Take 80 mg by mouth at bedtime.), Disp: 90 tablet, Rfl: 3   benzonatate  (TESSALON ) 100 MG capsule, Take 1 capsule (100 mg total) by mouth 3 (three) times daily. (Patient taking differently: Take 100 mg by mouth as needed for cough.), Disp: 20 capsule, Rfl: 0   cyanocobalamin  1000 MCG tablet, Take 1 tablet (1,000 mcg total) by mouth daily. (Patient taking differently: Take 1,000 mcg by mouth in the morning.), Disp: 30 tablet, Rfl: 3   dextromethorphan  (DELSYM ) 30 MG/5ML liquid, Take 2.5 mLs (15 mg total) by mouth 2 (two) times daily. (Patient taking differently: Take 15 mg by mouth at bedtime.), Disp: 89 mL, Rfl: 0   docusate sodium  (COLACE) 100 MG capsule, Take 1 capsule (100 mg total) by mouth 2 (two) times daily. (Patient taking differently: Take 100 mg by mouth as needed for mild constipation or moderate constipation.), Disp: 10 capsule, Rfl: 0   escitalopram  (LEXAPRO ) 10 MG tablet, Take 1 tablet (10 mg total) by mouth daily. (Patient taking differently: Take 10 mg by mouth in the morning.), Disp: 30 tablet, Rfl: 0   furosemide  (LASIX ) 40 MG tablet, Take 1 tablet (40 mg total) by mouth 2 (two) times daily., Disp: , Rfl:    gabapentin  (NEURONTIN ) 100 MG capsule, Take 1 capsule (100 mg total) by mouth 3 (three) times daily., Disp: 90 capsule, Rfl: 0   HUMALOG  KWIKPEN 100 UNIT/ML KwikPen, Inject  10-15 Units into the skin 3 (three) times daily. Can take 15-18 Units with meals while on Prednisone  for 3days, Disp: , Rfl:    HYDROcodone  bit-homatropine (HYCODAN) 5-1.5 MG/5ML syrup, Take 5 mLs by mouth every 6 (six) hours as needed for cough., Disp: 120 mL, Rfl: 0   HYDROcodone -acetaminophen  (NORCO/VICODIN) 5-325 MG tablet, Take 1 tablet by mouth at bedtime as needed., Disp: , Rfl:    hydrOXYzine (ATARAX) 25 MG tablet, Take 25 mg by mouth., Disp: , Rfl:    lisinopril  (ZESTRIL ) 10 MG tablet, Take 10 mg by mouth daily., Disp: , Rfl:    LORazepam  (ATIVAN ) 1 MG tablet, Take 1 tablet (1 mg total) by mouth at bedtime as needed for sleep., Disp: 3 tablet, Rfl: 0   Magnesium  200 MG TABS, 1 tablet at bedtime Orally Once a day, Disp: , Rfl:    methocarbamol  (ROBAXIN ) 500 MG tablet, Take 1 tablet (500 mg total) by mouth every 8 (eight) hours as needed for muscle spasms., Disp: 30 tablet, Rfl: 0   Multiple Vitamin (MULTIVITAMIN WITH MINERALS) TABS tablet, Take 1 tablet by mouth in the morning., Disp: , Rfl:    mupirocin  ointment (  BACTROBAN ) 2 %, Apply 1 Application topically at bedtime., Disp: , Rfl:    Nutritional Supplements (FEEDING SUPPLEMENT, NEPRO CARB STEADY,) LIQD, Take 237 mLs by mouth 2 (two) times daily between meals., Disp: , Rfl:    Olopatadine HCl (PATADAY OP), Place 2 drops into both eyes daily as needed (allergies)., Disp: , Rfl:    ondansetron  (ZOFRAN ) 4 MG tablet, Take 4 mg by mouth as needed for nausea or vomiting., Disp: , Rfl:    OXYGEN, Inhale 3 L into the lungs See admin instructions. Inhale 3L of Oxygen everynight at bedside. May uses throughout the day as needed., Disp: , Rfl:    pantoprazole  (PROTONIX ) 40 MG tablet, Take 1 tablet (40 mg total) by mouth 2 (two) times daily., Disp: 60 tablet, Rfl: 0   polyethylene glycol powder (GLYCOLAX /MIRALAX ) 17 GM/SCOOP powder, Take 17 g by mouth as needed for mild constipation or moderate constipation., Disp: , Rfl:    traZODone  (DESYREL ) 50 MG  tablet, Take 75 mg by mouth at bedtime as needed for sleep., Disp: , Rfl:    TRESIBA  FLEXTOUCH 100 UNIT/ML FlexTouch Pen, Inject 25 Units into the skin in the morning., Disp: , Rfl:    zinc  sulfate 220 (50 Zn) MG capsule, Take 1 capsule (220 mg total) by mouth daily. (Patient taking differently: Take 220 mg by mouth in the morning.), Disp: 30 capsule, Rfl: 0      Objective:   Vitals:   05/09/24 1431  BP: 125/74  Pulse: 82  SpO2: 97%  Weight: 232 lb 12.8 oz (105.6 kg)  Height: 6' (1.829 m)    Estimated body mass index is 31.57 kg/m as calculated from the following:   Height as of this encounter: 6' (1.829 m).   Weight as of this encounter: 232 lb 12.8 oz (105.6 kg).  @WEIGHTCHANGE @  [employer]   05/09/24 1431  Weight: 232 lb 12.8 oz (105.6 kg)     Physical Exam   General: No distress. Looks well O2 at rest: no Cane present: no Sitting in wheel chair: YES Frail: no Obese: no Neuro: Alert and Oriented x 3. GCS 15. Speech normal Psych: Pleasant Resp:  Barrel Chest - no.  Wheeze - no, Crackles - YES BASE, No overt respiratory distress CVS: Normal heart sounds. Murmurs - no Ext: Stigmata of Connective Tissue Disease - No bt RLE PREOSTENSIS. LLE foot amputatio HEENT: Normal upper airway. PEERL +. No post nasal drip        Assessment:       ICD-10-CM   1. 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HCC)  J84.112 CT Chest High Resolution    2. Chronic cough  R05.3 CT Chest High Resolution    3. DOE (dyspnea on exertion)  R06.09 CT Chest High Resolution    4. Chronic systolic CHF (congestive heart failure) (HCC)  I50.22 CT Chest High Resolution    5. History of COVID-19  Z86.16 CT Chest High Resolution    6. History of pulmonary embolism  Z86.711          Plan:     Patient Instructions     ICD-10-CM   1. 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HCC)  J84.112 CT Chest High Resolution    2. Chronic cough  R05.3 CT Chest High Resolution    3. DOE (dyspnea on  exertion)  R06.09 CT Chest High Resolution    4. Chronic systolic CHF (congestive heart failure) (HCC)  I50.22 CT Chest High Resolution    5. History of COVID-19  Z86.16 CT Chest High Resolution    6. History of pulmonary embolism  Z86.711       - Shortness of breath: I think this is multifactorial related to worsening of heart function, very mild anemia and also pulmonary fibrosis.  This could also be post-COVID long-haul but we need to ensure that fibrosis not worse  #Chronic cough: This could be because of fibrosis versus COVID long-haul but you are on lisinopril  which is known to make cough worse.    PLAN - - do HRCT at [facility] - next few weeks Please talk to cardiology or Dr. Delorise Few and see if you can stop lisinopril   Followup Video visit some 2 weeks after getting CT chest to discuss results [okay to book at 430 slot or 8:30 in the morning]   FOLLOWUP Return in about 4 weeks (around 06/06/2024) for 15 min visit, VIDEO VISIT, with Dr Bertrum Brodie.  (Level 04 E&M 2024: Estb >= 30 min  New >= 45 min  pent in total care time and counseling or/and coordination of care by this undersigned MD - Dr Maire Scot. This includes one or more of the following on this same day 05/09/2024: pre-charting, chart review, note writing, documentation discussion of test results, diagnostic or treatment recommendations, prognosis, risks and benefits of management options, instructions, education, compliance or risk-factor reduction. It excludes time spent by the CMA or office staff in the care of the patient . Actual time is 35 min)   SIGNATURE    Dr. Maire Scot, M.D., F.C.C.P,  Pulmonary and Critical Care Medicine Staff Physician, [facility] Director - Interstitial Lung Disease  Program  Pulmonary Fibrosis [facility] at [facility] [place], [place], [address]  Pager: [phone], If no answer or between  15:00h - 7:00h: call [phone] Telephone: [phone]  3:11 PM 05/09/2024

## 2024-05-09 NOTE — Patient Instructions (Addendum)
 ICD-10-CM   1. 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HCC)  J84.112 CT Chest High Resolution    2. Chronic cough  R05.3 CT Chest High Resolution    3. DOE (dyspnea on exertion)  R06.09 CT Chest High Resolution    4. Chronic systolic CHF (congestive heart failure) (HCC)  I50.22 CT Chest High Resolution    5. History of COVID-19  Z86.16 CT Chest High Resolution    6. History of pulmonary embolism  Z86.711       - Shortness of breath: I think this is multifactorial related to worsening of heart function, very mild anemia and also pulmonary fibrosis.  This could also be post-COVID long-haul but we need to ensure that fibrosis not worse  #Chronic cough: This could be because of fibrosis versus COVID long-haul but you are on lisinopril  which is known to make cough worse.    PLAN - - do HRCT at [facility] - next few weeks Please talk to cardiology or Dr. Delorise Few and see if you can stop lisinopril   Followup Video visit some 2 weeks after getting CT chest to discuss results [okay to book at 430 slot or 8:30 in the morning]

## 2024-05-13 ENCOUNTER — Telehealth: Payer: Self-pay | Admitting: *Deleted

## 2024-05-13 NOTE — Telephone Encounter (Signed)
 Ok pls let her know when I idcated this note I was aware

## 2024-05-13 NOTE — Telephone Encounter (Signed)
 FYI ONLY  Copied from CRM [id number]. Topic: General - Other >> May 09, 2024  3:54 PM Eveleen Hinds B wrote: Reason for CRM: Wife Devra Fontana called. She did not share this information today as she did not want to upset patient. Patient was an in patient at [facility] 5/7 through 5/11.  Syncope and SOB. Please review hospital charts from those dates of service. He was Urgently transferred to [facility] as he passed. Wife is concerned because patient is easily agitated. Wife states patient has anxiety and depression. She wanted to get this information to Dr Bertrum Brodie.  Devra Fontana [phone] If you have any questions.   I called and spoke with the pt's spouse, ok per DPR to confirm this msg. She states she was just calling to let MR know that pt was admitted to the hospital in May 2025. I advised that the records are in the system for MR to review. Routing to MR as FYI per spouse's request.

## 2024-05-21 ENCOUNTER — Ambulatory Visit: Admitting: Orthopedic Surgery

## 2024-05-21 DIAGNOSIS — Z89511 Acquired absence of right leg below knee: Secondary | ICD-10-CM | POA: Diagnosis not present

## 2024-05-21 DIAGNOSIS — Z89439 Acquired absence of unspecified foot: Secondary | ICD-10-CM

## 2024-05-21 DIAGNOSIS — L97521 Non-pressure chronic ulcer of other part of left foot limited to breakdown of skin: Secondary | ICD-10-CM | POA: Diagnosis not present

## 2024-05-27 ENCOUNTER — Encounter: Payer: Self-pay | Admitting: Orthopedic Surgery

## 2024-05-27 NOTE — Progress Notes (Signed)
 Office Visit Note   Patient: Shane [name]           Date of Birth: 12-09-1940           MRN: [id number] Visit Date: 05/21/2024              Requested by: Shepard Ade, MD [address] [place],  [place] [address] PCP: Shepard Ade, MD  Chief Complaint  Patient presents with   Left Foot - Wound Check      HPI: Patient is an 83 year old gentleman who presents in follow-up for left transmetatarsal amputation.  Patient is currently wearing a dry dressing with a Vive sock on top.  He is using a nitroglycerin  patch.  He has a stable right transtibial amputation.  Assessment & Plan: Visit Diagnoses:  1. History of transmetatarsal amputation of foot (HCC)   2. Non-pressure chronic ulcer of other part of left foot limited to breakdown of skin (HCC)   3. Hx of right BKA (HCC)     Plan: Continue with the alginate dressing change the nitroglycerin  patch and the compression sock.  Follow-Up Instructions: Return in about 4 weeks (around 06/18/2024).   Ortho Exam  Patient is alert, oriented, no adenopathy, well-dressed, normal affect, normal respiratory effort. Examination the dorsal ankle ulcer is 4 x 0.5 cm with 100% flat healthy granulation tissue.  Patient has a lateral ulcer that is 2 x 2 cm that also is flat with healthy granulation tissue.    Imaging: No results found. No images are attached to the encounter.  Labs: Lab Results  Component Value Date   HGBA1C 8.7 (H) 04/09/2024   HGBA1C 8.5 (H) 12/06/2023   HGBA1C 9.0 (H) 06/24/2023   ESRSEDRATE 84 (H) 12/29/2023   ESRSEDRATE 22 (H) 10/14/2016   CRP 3.5 (H) 12/29/2023   CRP 9.0 (H) 12/08/2023   CRP 8.3 (H) 12/09/2020   REPTSTATUS 04/15/2024 FINAL 04/09/2024   GRAMSTAIN  02/19/2021    RARE WBC PRESENT, PREDOMINANTLY PMN NO ORGANISMS SEEN    CULT  04/09/2024    NO GROWTH 5 DAYS Performed at [facility], [address], [place], [place] [address]    [facility] ESCHERICHIA COLI 02/19/2021      Lab Results  Component Value Date   ALBUMIN  2.9 (L) 04/11/2024   ALBUMIN  2.9 (L) 04/10/2024   ALBUMIN  3.2 (L) 04/09/2024    Lab Results  Component Value Date   MG 1.6 (L) 04/09/2024   MG 1.8 12/30/2023   MG 1.9 12/29/2023   Lab Results  Component Value Date   VD25OH 32.29 12/14/2022    No results found for: PREALBUMIN    Latest Ref Rng & Units 04/13/2024    5:05 AM 04/12/2024    4:07 AM 04/11/2024    2:20 AM  CBC EXTENDED  WBC 4.0 - 10.5 K/uL 4.7  5.7  5.2   RBC 4.22 - 5.81 MIL/uL 3.70  3.84  3.46   Hemoglobin 13.0 - 17.0 g/dL 88.8  88.4  89.4   HCT 39.0 - 52.0 % 33.5  34.7  31.3   Platelets 150 - 400 K/uL 133  137  135      There is no height or weight on file to calculate BMI.  Orders:  No orders of the defined types were placed in this encounter.  No orders of the defined types were placed in this encounter.    Procedures: No procedures performed  Clinical Data: No additional findings.  ROS:  All  other systems negative, except as noted in the HPI. Review of Systems  Objective: Vital Signs: There were no vitals taken for this visit.  Specialty Comments:  No specialty comments available.  PMFS History: Patient Active Problem List   Diagnosis Date Noted   Acute kidney injury superimposed on stage 3a chronic kidney disease (HCC) 04/09/2024   Impaired functional mobility, balance, gait, and endurance 04/09/2024   Non-pressure chronic ulcer of other part of left foot limited to breakdown of skin (HCC) 12/27/2023   Type 2 diabetes mellitus without complication, with long-term current use of insulin  (HCC) 12/12/2023   Esophageal dysphagia 12/11/2023   COVID-19 12/11/2023   Acute on chronic hypoxic respiratory failure (HCC) 12/10/2023   Anemia 12/10/2023   Non-ST elevation (NSTEMI) myocardial infarction (HCC) 12/09/2023   Retroperitoneal bleeding 12/09/2023   History of transcatheter aortic valve replacement (TAVR) 12/09/2023   Chronic heart  failure with preserved ejection fraction (HFpEF) (HCC) 12/09/2023   Hx of CABG 12/09/2023   Atherosclerosis of native coronary artery of native heart without angina pectoris 12/09/2023   Hyperkalemia 12/07/2023   Acute on chronic congestive heart failure (HCC) 12/07/2023   Acute kidney injury (nontraumatic) (HCC) 12/06/2023   Acute on chronic combined systolic and diastolic CHF, NYHA class 1 (HCC) 06/22/2023   Acute on chronic systolic (congestive) heart failure (HCC) 06/21/2023   Intermediate stage nonexudative age-related macular degeneration of both eyes 04/25/2022   Research study patient 12/24/2021   Labile blood glucose    Pulmonary fibrosis (HCC)    Hyponatremia    S/P BKA (below knee amputation) unilateral, right (HCC)    Shortness of breath    Atrial fibrillation (HCC)    Postoperative pain    Atherosclerosis of native arteries of extremities with gangrene, right leg (HCC)    Hardware complicating wound infection (HCC)    Abscess of bursa of right ankle 02/15/2021   Endocarditis 01/19/2021   PAD (peripheral artery disease) (HCC) 12/29/2020   Chronic systolic CHF (congestive heart failure) (HCC) 12/22/2020   History of COVID-19 12/22/2020   SSS (sick sinus syndrome) (HCC) 12/22/2020   Acute bacterial endocarditis    MSSA bacteremia 12/09/2020   Diabetic foot ulcer (HCC) 12/09/2020   Foot drop, right foot    Generalized weakness 12/08/2020   Atrial fibrillation, chronic (HCC) 12/08/2020   Elevated troponin level not due myocardial infarction 12/08/2020   Adrenal insufficiency (HCC) 11/14/2020   Orthostatic hypotension 11/14/2020   Physical deconditioning 11/14/2020   Difficulty sleeping 11/07/2020   Right ventricular dysfunction 11/05/2020   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/04/2020   Hearing loss 11/03/2020   Cardiac device in situ 09/09/2020   COVID-19 virus infection 07/18/2020   Coronary artery disease involving native heart without angina pectoris/ S/P CABG x3 07/10/2020   Chronic  kidney disease, stage 3a (HCC) 03/29/2020   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/28/2020   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) 03/16/2020   Right epiretinal membrane 03/16/2020   Posterior vitreous detachment of right eye 03/16/2020   Aortic stenosis, moderate 03/12/2020   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   Near syncope 04/27/2018   Hypoglycemia due to insulin  01/06/2018   Essential hypertension 01/06/2018   Syncope and collapse 01/05/2018   Encounter for therapeutic drug monitoring 06/29/2017   OSA on CPAP 05/05/2017   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HCC) 01/05/2017   Abnormal chest x-ray 10/12/2016   Benign neoplasm of colon 01/14/2013   Gastro-esophageal reflux disease without  esophagitis 11/24/2009   Past Medical History:  Diagnosis Date   Acute blood loss anemia    Anxiety    AV block, Mobitz 1    Cataract    Chronic kidney disease    d/t DM   CKD (chronic kidney disease), stage III (HCC)    COVID-19 virus infection 07/18/2020   Last Assessment & Plan:   Formatting of this note might be different from the original.  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 10/2019 Echo: EF 55-60%, Gr2 DD. Nl RV.    Neuromuscular disorder (HCC)    Neuropathy associated with endocrine disorder (HCC)    Nonobstructive CAD (coronary artery disease)    a. 2012 Cath: mod, nonobs dzs; b. 10/2016 MV: EF 60%, no ischemia.   OSA on CPAP 05/05/2017   Unattended Home Sleep  Test 7/2/813-AHI 38.6/hour, desaturation to 64%, body weight 261 pounds   PONV (postoperative nausea and vomiting)    Postoperative anemia due to acute blood loss 11/07/2020   Postoperative hemorrhagic shock 03/20/2021   Sleep apnea     uses cpap asked to bring mask and tubing    Family History  Problem Relation Age of Onset   Diabetes Mellitus II Mother    Emphysema Father 4   Heart attack Father    Colon cancer Neg Hx    Esophageal cancer Neg Hx    Rectal cancer Neg Hx    Stomach cancer Neg Hx     Past Surgical History:  Procedure Laterality Date   ABDOMINAL AORTOGRAM W/LOWER EXTREMITY N/A 12/10/2020   Procedure: ABDOMINAL AORTOGRAM W/LOWER EXTREMITY;  Surgeon: Magda Debby SAILOR, MD;  Location: [facility];  Service: Cardiovascular;  Laterality: N/A;   AMPUTATION Right 01/22/2021   Procedure: 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;  Surgeon: Harden Jerona GAILS, MD;  Location: [facility] OR;  Service: Orthopedics;  Laterality: Right;   AMPUTATION Right 03/17/2021   Procedure: RIGHT BELOW KNEE AMPUTATION;  Surgeon: Harden Jerona GAILS, MD;  Location: [facility] OR;  Service: Orthopedics;  Laterality: Right;   ANKLE FUSION Right 01/22/2021   Procedure: RIGHT FOOT TIBIOCALCANEAL FUSION;  Surgeon: Harden Jerona GAILS, MD;  Location: [facility] OR;  Service: Orthopedics;  Laterality: Right;   ANTERIOR FUSION CERVICAL SPINE  2012   CARDIAC CATHETERIZATION  2011   CARDIAC CATHETERIZATION N/A 11/09/2016   Procedure: Right Heart Cath;  Surgeon: Victory LELON Sharps, MD;  Location: [facility] INVASIVE CV LAB;  Service: Cardiovascular;  Laterality: N/A;   carpel tunnel     left wrist   CATARACT EXTRACTION     CATARACT EXTRACTION W/ INTRAOCULAR LENS  IMPLANT, BILATERAL  2013   CERVICAL LAMINECTOMY  2012   COLONOSCOPY N/A 01/14/2013   Procedure: COLONOSCOPY;  Surgeon: Norleen SAILOR Kiang, MD;  Location: [facility] ENDOSCOPY;  Service: Endoscopy;  Laterality: N/A;   CORONARY ARTERY BYPASS GRAFT  11/04/2020   LIMA-LAD, SVG-OM1, SVG-PDA (Dr Norleen Exon [facility]) dc 11/18/2020    EYE SURGERY  I & D EXTREMITY Right 02/19/2021   Procedure: RIGHT ANKLE DEBRIDEMENT AND PLACEMENT ANTIBIOTIC BEADS;  Surgeon: Harden Jerona GAILS, MD;  Location: [facility] OR;  Service: Orthopedics;  Laterality: Right;   KNEE SURGERY  1998   left   LEFT HEART CATH AND CORONARY ANGIOGRAPHY N/A 07/10/2020   Procedure: LEFT HEART CATH AND CORONARY ANGIOGRAPHY;  Surgeon: Wonda Sharper, MD;  Location: [facility] INVASIVE CV LAB;  Service: Cardiovascular;  Laterality: N/A;   LUMBAR LAMINECTOMY  2003   LUNG BIOPSY Left 12/26/2016   Procedure: LUNG BIOPSY;  Surgeon: Elspeth JAYSON Millers, MD;  Location: [facility] OR;  Service: Thoracic;  Laterality: Left;   PACEMAKER IMPLANT N/A 03/30/2020   Procedure: PACEMAKER IMPLANT;  Surgeon: Waddell Danelle ORN, MD;  Location: [facility];  Service: Cardiovascular;  Laterality: N/A;   PERIPHERAL VASCULAR INTERVENTION Right 12/10/2020   Procedure: PERIPHERAL VASCULAR INTERVENTION;  Surgeon: Magda Debby SAILOR, MD;  Location: [facility];  Service: Cardiovascular;  Laterality: Right;  SFA   POSTERIOR FUSION CERVICAL SPINE  2012   PPM GENERATOR REMOVAL N/A 12/14/2020   Procedure: PPM GENERATOR REMOVAL;  Surgeon: Waddell Danelle ORN, MD;  Location: [facility];  Service: Cardiovascular;  Laterality: N/A;   TEE WITHOUT CARDIOVERSION N/A 12/11/2020   Procedure: TRANSESOPHAGEAL ECHOCARDIOGRAM (TEE);  Surgeon: Barbaraann Darryle Debby, MD;  Location: [facility] ENDOSCOPY;  Service: Cardiovascular;  Laterality: N/A;   TRIGGER FINGER RELEASE  2011   4th finger left hand   VIDEO ASSISTED THORACOSCOPY Left 12/26/2016   Procedure: VIDEO ASSISTED THORACOSCOPY;  Surgeon: Elspeth JAYSON Millers, MD;  Location: [facility] OR;  Service: Thoracic;  Laterality: Left;   VIDEO BRONCHOSCOPY N/A 12/26/2016   Procedure: VIDEO BRONCHOSCOPY;  Surgeon: Elspeth JAYSON Millers, MD;  Location: [facility] OR;  Service: Thoracic;  Laterality: N/A;   Social History   Occupational History   Not on file  Tobacco Use   Smoking status: Never     Passive exposure: Past   Smokeless tobacco: Never  Vaping Use   Vaping status: Never Used  Substance and Sexual Activity   Alcohol use: No   Drug use: No   Sexual activity: Not Currently

## 2024-05-28 ENCOUNTER — Ambulatory Visit: Admitting: Internal Medicine

## 2024-05-31 ENCOUNTER — Ambulatory Visit (HOSPITAL_BASED_OUTPATIENT_CLINIC_OR_DEPARTMENT_OTHER)
Admission: RE | Admit: 2024-05-31 | Discharge: 2024-05-31 | Disposition: A | Discharge status: Home / Self Care | Source: Ambulatory Visit | Attending: Internal Medicine | Admitting: Internal Medicine

## 2024-05-31 DIAGNOSIS — R0609 Other forms of dyspnea: Secondary | ICD-10-CM | POA: Diagnosis present

## 2024-05-31 DIAGNOSIS — Z8616 Personal history of COVID-19: Secondary | ICD-10-CM | POA: Insufficient documentation

## 2024-05-31 DIAGNOSIS — I5022 Chronic systolic (congestive) heart failure: Secondary | ICD-10-CM | POA: Insufficient documentation

## 2024-05-31 DIAGNOSIS — R053 Chronic cough: Secondary | ICD-10-CM | POA: Diagnosis present

## 2024-05-31 DIAGNOSIS — J84112 Idiopathic pulmonary fibrosis: Secondary | ICD-10-CM | POA: Diagnosis present

## 2024-06-05 ENCOUNTER — Encounter: Payer: Self-pay | Admitting: Family

## 2024-06-05 ENCOUNTER — Ambulatory Visit: Admitting: Family

## 2024-06-05 DIAGNOSIS — L97521 Non-pressure chronic ulcer of other part of left foot limited to breakdown of skin: Secondary | ICD-10-CM

## 2024-06-05 DIAGNOSIS — Z89439 Acquired absence of unspecified foot: Secondary | ICD-10-CM

## 2024-06-05 DIAGNOSIS — Z89511 Acquired absence of right leg below knee: Secondary | ICD-10-CM

## 2024-06-05 DIAGNOSIS — Z89432 Acquired absence of left foot: Secondary | ICD-10-CM | POA: Diagnosis not present

## 2024-06-05 DIAGNOSIS — I739 Peripheral vascular disease, unspecified: Secondary | ICD-10-CM

## 2024-06-05 NOTE — Progress Notes (Signed)
 Office Visit Note   Patient: Shane [name]           Date of Birth: Jun 25, 1941           MRN: [id number] Visit Date: 06/05/2024              Requested by: Shepard Ade, MD [address] [place],  [place] [address] PCP: Shepard Ade, MD  Chief Complaint  Patient presents with   Left Foot - Follow-up    TMA      HPI: The patient is an 83 year old gentleman who is seen in follow-up for ulcers to his left foot.  Has history of transmetatarsal amputation on the left as well  Has been packing with silver cell of the lateral ulcer and wearing his medical compression stocking to the left lower extremity he also was previously using a nitroglycerin  patch.  Today has a new ulcer at the area where the patch was applied   Patient has previously undergone Atherectomy and balloon angioplasty of the left popliteal and peroneal arteries January 2023.   Assessment & Plan: Visit Diagnoses: No diagnosis found.  Plan: Has scheduled follow-up with vascular at [facility] in 2 weeks  Will place him in a 4-layer compression wrap packing the lateral wound with silver cell donation Kerecis micro powder to the 2 dorsal foot ulcers follow-up in 1 week  Follow-Up Instructions: No follow-ups on file.   Ortho Exam  Patient is alert, oriented, no adenopathy, well-dressed, normal affect, normal respiratory effort. On examination left lower extremity there is 1+ pitting edema with out erythema no weeping there are 2 dorsal ulcers to the anterior ankle ulcer is now 4 cm x 2 cm just distal to this there is a quarter sized open area which is superficial filled in with 100% granulation  Over the distal lateral foot there is also ulceration which is 2-1/2 cm 1 cm deep does not probe to bone or tendon debrided of fibrinous exudative tissue back to bleeding granulation there is some surrounding maceration today    Imaging: No results found. No images are attached to the  encounter.  Labs: Lab Results  Component Value Date   HGBA1C 8.7 (H) 04/09/2024   HGBA1C 8.5 (H) 12/06/2023   HGBA1C 9.0 (H) 06/24/2023   ESRSEDRATE 84 (H) 12/29/2023   ESRSEDRATE 22 (H) 10/14/2016   CRP 3.5 (H) 12/29/2023   CRP 9.0 (H) 12/08/2023   CRP 8.3 (H) 12/09/2020   REPTSTATUS 04/15/2024 FINAL 04/09/2024   GRAMSTAIN  02/19/2021    RARE WBC PRESENT, PREDOMINANTLY PMN NO ORGANISMS SEEN    CULT  04/09/2024    NO GROWTH 5 DAYS Performed at [facility], [address], [place], [place] [address]    [facility] ESCHERICHIA COLI 02/19/2021     Lab Results  Component Value Date   ALBUMIN  2.9 (L) 04/11/2024   ALBUMIN  2.9 (L) 04/10/2024   ALBUMIN  3.2 (L) 04/09/2024    Lab Results  Component Value Date   MG 1.6 (L) 04/09/2024   MG 1.8 12/30/2023   MG 1.9 12/29/2023   Lab Results  Component Value Date   VD25OH 32.29 12/14/2022    No results found for: PREALBUMIN    Latest Ref Rng & Units 04/13/2024    5:05 AM 04/12/2024    4:07 AM 04/11/2024    2:20 AM  CBC EXTENDED  WBC 4.0 - 10.5 K/uL 4.7  5.7  5.2   RBC 4.22 - 5.81 MIL/uL 3.70  3.84  3.46   Hemoglobin 13.0 - 17.0 g/dL 88.8  88.4  89.4   HCT 39.0 - 52.0 % 33.5  34.7  31.3   Platelets 150 - 400 K/uL 133  137  135      There is no height or weight on file to calculate BMI.  Orders:  No orders of the defined types were placed in this encounter.  No orders of the defined types were placed in this encounter.    Procedures: No procedures performed  Clinical Data: No additional findings.  ROS:  All other systems negative, except as noted in the HPI. Review of Systems  Objective: Vital Signs: There were no vitals taken for this visit.  Specialty Comments:  No specialty comments available.  PMFS History: Patient Active Problem List   Diagnosis Date Noted   Acute kidney injury superimposed on stage 3a chronic kidney disease (HCC) 04/09/2024   Impaired functional mobility, balance, gait, and  endurance 04/09/2024   Non-pressure chronic ulcer of other part of left foot limited to breakdown of skin (HCC) 12/27/2023   Type 2 diabetes mellitus without complication, with long-term current use of insulin  (HCC) 12/12/2023   Esophageal dysphagia 12/11/2023   COVID-19 12/11/2023   Acute on chronic hypoxic respiratory failure (HCC) 12/10/2023   Anemia 12/10/2023   Non-ST elevation (NSTEMI) myocardial infarction (HCC) 12/09/2023   Retroperitoneal bleeding 12/09/2023   History of transcatheter aortic valve replacement (TAVR) 12/09/2023   Chronic heart failure with preserved ejection fraction (HFpEF) (HCC) 12/09/2023   Hx of CABG 12/09/2023   Atherosclerosis of native coronary artery of native heart without angina pectoris 12/09/2023   Hyperkalemia 12/07/2023   Acute on chronic congestive heart failure (HCC) 12/07/2023   Acute kidney injury (nontraumatic) (HCC) 12/06/2023   Acute on chronic combined systolic and diastolic CHF, NYHA class 1 (HCC) 06/22/2023   Acute on chronic systolic (congestive) heart failure (HCC) 06/21/2023   Intermediate stage nonexudative age-related macular degeneration of both eyes 04/25/2022   Research study patient 12/24/2021   Labile blood glucose    Pulmonary fibrosis (HCC)    Hyponatremia    S/P BKA (below knee amputation) unilateral, right (HCC)    Shortness of breath    Atrial fibrillation (HCC)    Postoperative pain    Atherosclerosis of native arteries of extremities with gangrene, right leg (HCC)    Hardware complicating wound infection (HCC)    Abscess of bursa of right ankle 02/15/2021   Endocarditis 01/19/2021   PAD (peripheral artery disease) (HCC) 12/29/2020   Chronic systolic CHF (congestive heart failure) (HCC) 12/22/2020   History of COVID-19 12/22/2020   SSS (sick sinus syndrome) (HCC) 12/22/2020   Acute bacterial endocarditis    MSSA bacteremia 12/09/2020   Diabetic foot ulcer (HCC) 12/09/2020   Foot drop, right foot    Generalized  weakness 12/08/2020   Atrial fibrillation, chronic (HCC) 12/08/2020   Elevated troponin level not due myocardial infarction 12/08/2020   Adrenal insufficiency (HCC) 11/14/2020   Orthostatic hypotension 11/14/2020   Physical deconditioning 11/14/2020   Difficulty sleeping 11/07/2020   Right ventricular dysfunction 11/05/2020   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/04/2020   Hearing loss 11/03/2020   Cardiac device in situ 09/09/2020   COVID-19 virus infection 07/18/2020   Coronary artery disease involving native heart without angina pectoris/ S/P CABG x3 07/10/2020   Chronic kidney disease, stage 3a (HCC) 03/29/2020   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/28/2020   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 (  HCC) 03/16/2020   Right epiretinal membrane 03/16/2020   Posterior vitreous detachment of right eye 03/16/2020   Aortic stenosis, moderate 03/12/2020   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   Near syncope 04/27/2018   Hypoglycemia due to insulin  01/06/2018   Essential hypertension 01/06/2018   Syncope and collapse 01/05/2018   Encounter for therapeutic drug monitoring 06/29/2017   OSA on CPAP 05/05/2017   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HCC) 01/05/2017   Abnormal chest x-ray 10/12/2016   Benign neoplasm of colon 01/14/2013   Gastro-esophageal reflux disease without esophagitis 11/24/2009   Past Medical History:  Diagnosis Date   Acute blood loss anemia    Anxiety    AV block, Mobitz 1    Cataract    Chronic kidney disease    d/t DM   CKD (chronic kidney disease), stage III (HCC)    COVID-19 virus infection 07/18/2020   Last Assessment & Plan:   Formatting of this note might be different from the original.  Immunocompromise high risk patient  -ID consulted for further therapies and will administer regeneron as OP tomorrow   -Decadron  discontinued as patient is off O2     Depression    Diabetes mellitus    Vgo disposal insulin  bolus  simular  to insulin  pump   Dyspnea    GERD (gastroesophageal reflux disease)    History of kidney stones    passed   Hyperlipidemia    Hypertension    Idiopathic pulmonary fibrosis (HCC) 11/2016   ILD (interstitial lung disease) (HCC)    Moderate aortic stenosis    a. 10/2019 Echo: EF 55-60%, Gr2 DD. Nl RV.    Neuromuscular disorder (HCC)    Neuropathy associated with endocrine disorder (HCC)    Nonobstructive CAD (coronary artery disease)    a. 2012 Cath: mod, nonobs dzs; b. 10/2016 MV: EF 60%, no ischemia.   OSA on CPAP 05/05/2017   Unattended Home Sleep Test 7/2/813-AHI 38.6/hour, desaturation to 64%, body weight 261 pounds   PONV (postoperative nausea and vomiting)    Postoperative anemia due to acute blood loss 11/07/2020   Postoperative hemorrhagic shock 03/20/2021   Sleep apnea     uses cpap asked to bring mask and tubing    Family History  Problem Relation Age of Onset   Diabetes Mellitus II Mother    Emphysema Father 67   Heart attack Father    Colon cancer Neg Hx    Esophageal cancer Neg Hx    Rectal cancer Neg Hx    Stomach cancer Neg Hx     Past Surgical History:  Procedure Laterality Date   ABDOMINAL AORTOGRAM W/LOWER EXTREMITY N/A 12/10/2020   Procedure: ABDOMINAL AORTOGRAM W/LOWER EXTREMITY;  Surgeon: Magda Debby SAILOR, MD;  Location: [facility] CV LAB;  Service: Cardiovascular;  Laterality: N/A;   AMPUTATION Right 01/22/2021   Procedure: 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;  Surgeon: Harden Jerona GAILS, MD;  Location: [facility] OR;  Service: Orthopedics;  Laterality: Right;   AMPUTATION Right 03/17/2021   Procedure: RIGHT BELOW KNEE AMPUTATION;  Surgeon: Harden Jerona GAILS, MD;  Location[facility] OR;  Service: Orthopedics;  Laterality: Right;   ANKLE FUSION Right 01/22/2021   Procedure: RIGHT FOOT TIBIOCALCANEAL FUSION;  Surgeon: Harden Jerona GAILS, MD;  Location: [facility] OR;  Service: Orthopedics;  Laterality: Right;   ANTERIOR FUSION CERVICAL SPINE  2012   CARDIAC CATHETERIZATION  2011   CARDIAC  CATHETERIZATION N/A 11/09/2016   Procedure: Right Heart Cath;  Surgeon: Victory LELON Sharps,  MD;  Location: [facility];  Service: Cardiovascular;  Laterality: N/A;   carpel tunnel     left wrist   CATARACT EXTRACTION     CATARACT EXTRACTION W/ INTRAOCULAR LENS  IMPLANT, BILATERAL  2013   CERVICAL LAMINECTOMY  2012   COLONOSCOPY N/A 01/14/2013   Procedure: COLONOSCOPY;  Surgeon: Norleen LOISE Kiang, MD;  Location: [facility] ENDOSCOPY;  Service: Endoscopy;  Laterality: N/A;   CORONARY ARTERY BYPASS GRAFT  11/04/2020   LIMA-LAD, SVG-OM1, SVG-PDA (Dr Norleen [facility]) dc 11/18/2020   EYE SURGERY     I & D EXTREMITY Right 02/19/2021   Procedure: RIGHT ANKLE DEBRIDEMENT AND PLACEMENT ANTIBIOTIC BEADS;  Surgeon: Harden Jerona GAILS, MD;  Location: [facility] OR;  Service: Orthopedics;  Laterality: Right;   KNEE SURGERY  1998   left   LEFT HEART CATH AND CORONARY ANGIOGRAPHY N/A 07/10/2020   Procedure: LEFT HEART CATH AND CORONARY ANGIOGRAPHY;  Surgeon: Wonda Sharper, MD;  Location: [facility] INVASIVE CV LAB;  Service: Cardiovascular;  Laterality: N/A;   LUMBAR LAMINECTOMY  2003   LUNG BIOPSY Left 12/26/2016   Procedure: LUNG BIOPSY;  Surgeon: Elspeth JAYSON Millers, MD;  Location: [facility] OR;  Service: Thoracic;  Laterality: Left;   PACEMAKER IMPLANT N/A 03/30/2020   Procedure: PACEMAKER IMPLANT;  Surgeon: Waddell Danelle ORN, MD;  Location: [facility];  Service: Cardiovascular;  Laterality: N/A;   PERIPHERAL VASCULAR INTERVENTION Right 12/10/2020   Procedure: PERIPHERAL VASCULAR INTERVENTION;  Surgeon: Magda Debby LOISE, MD;  Location: [facility];  Service: Cardiovascular;  Laterality: Right;  SFA   POSTERIOR FUSION CERVICAL SPINE  2012   PPM GENERATOR REMOVAL N/A 12/14/2020   Procedure: PPM GENERATOR REMOVAL;  Surgeon: Waddell Danelle ORN, MD;  Location: [facility];  Service: Cardiovascular;  Laterality: N/A;   TEE WITHOUT CARDIOVERSION N/A 12/11/2020   Procedure: TRANSESOPHAGEAL ECHOCARDIOGRAM (TEE);  Surgeon: Barbaraann Darryle Debby, MD;  Location: [facility] ENDOSCOPY;  Service: Cardiovascular;  Laterality: N/A;   TRIGGER FINGER RELEASE  2011   4th finger left hand   VIDEO ASSISTED THORACOSCOPY Left 12/26/2016   Procedure: VIDEO ASSISTED THORACOSCOPY;  Surgeon: Elspeth JAYSON Millers, MD;  Location: [facility] OR;  Service: Thoracic;  Laterality: Left;   VIDEO BRONCHOSCOPY N/A 12/26/2016   Procedure: VIDEO BRONCHOSCOPY;  Surgeon: Elspeth JAYSON Millers, MD;  Location: [facility] OR;  Service: Thoracic;  Laterality: N/A;   Social History   Occupational History   Not on file  Tobacco Use   Smoking status: Never    Passive exposure: Past   Smokeless tobacco: Never  Vaping Use   Vaping status: Never Used  Substance and Sexual Activity   Alcohol use: No   Drug use: No   Sexual activity: Not Currently

## 2024-06-12 ENCOUNTER — Ambulatory Visit: Admitting: Family

## 2024-06-12 ENCOUNTER — Encounter: Payer: Self-pay | Admitting: Family

## 2024-06-12 ENCOUNTER — Ambulatory Visit: Admitting: Internal Medicine

## 2024-06-12 DIAGNOSIS — R0609 Other forms of dyspnea: Secondary | ICD-10-CM

## 2024-06-12 DIAGNOSIS — Z89439 Acquired absence of unspecified foot: Secondary | ICD-10-CM

## 2024-06-12 DIAGNOSIS — J84112 Idiopathic pulmonary fibrosis: Secondary | ICD-10-CM | POA: Diagnosis not present

## 2024-06-12 DIAGNOSIS — I5022 Chronic systolic (congestive) heart failure: Secondary | ICD-10-CM

## 2024-06-12 DIAGNOSIS — L97521 Non-pressure chronic ulcer of other part of left foot limited to breakdown of skin: Secondary | ICD-10-CM

## 2024-06-12 DIAGNOSIS — Z8616 Personal history of COVID-19: Secondary | ICD-10-CM

## 2024-06-12 DIAGNOSIS — R053 Chronic cough: Secondary | ICD-10-CM | POA: Diagnosis not present

## 2024-06-12 DIAGNOSIS — Z86711 Personal history of pulmonary embolism: Secondary | ICD-10-CM

## 2024-06-12 DIAGNOSIS — I739 Peripheral vascular disease, unspecified: Secondary | ICD-10-CM | POA: Diagnosis not present

## 2024-06-12 NOTE — Patient Instructions (Addendum)
 -   Shortness of breath  - Glad things have better but residual shortness of breath is because of combination of chronic systolic heart failure and also pulmonary fibrosis and physical deconditioning  - Glad oxygen [purchased out-of-pocket] is helping you    #Chronic cough:  - Glad this is completely resolved   #Pulmonary fibrosis  - Definitely stable in the last 1 year on CT scan  PLAN - -Continue oxygen, physical exercise and cardiac optimization -You can consider hyperbaric oxygen therapy for your wound if you are interested - Continue to monitor pulmonary fibrosis and consider antifibrotic's if there is any change in fibrosis  Followup -4-5 months 15-minute visit follow-up

## 2024-06-12 NOTE — Progress Notes (Signed)
 Office Visit Note   Patient: Shane [name]           Date of Birth: October 23, 1941           MRN: [id number] Visit Date: 06/12/2024              Requested by: Shepard Ade, MD [address] [place],  [place] [address] PCP: Shepard Ade, MD  Chief Complaint  Patient presents with   Left Foot - Follow-up      HPI: The patient is an 83 year old gentleman seen in follow-up for ulcers over the dorsum of the left foot as well as the lateral column history of transmetatarsal amputation on the left.  He has been in a 4-layer compression wrap for the last 1 week with silver cell packing laterally donation Kerecis micro powder over the dorsum  Assessment & Plan: Visit Diagnoses: No diagnosis found.  Plan: Donation Kerecis micro powder to both wounds 4-layer compression wrap reapplied he will follow-up in the office in 1 week  Follow-Up Instructions: No follow-ups on file.   Ortho Exam  Patient is alert, oriented, no adenopathy, well-dressed, normal affect, normal respiratory effort. On examination left lower extremity there is good wrinkling of the skin from compression no erythema or weeping dorsally the distal ulcer is fully healed the proximal ulcer is now 3-1/2 cm x 1-1/2 cm with 1 mm of depth there is uptake of the Kerecis material The lateral ulcer has improved this is now 2 cm in diameter with 8 mm of depth the surrounding tissue is no longer boggy there is no active drainage full granulation   Imaging: No results found. No images are attached to the encounter.  Labs: Lab Results  Component Value Date   HGBA1C 8.7 (H) 04/09/2024   HGBA1C 8.5 (H) 12/06/2023   HGBA1C 9.0 (H) 06/24/2023   ESRSEDRATE 84 (H) 12/29/2023   ESRSEDRATE 22 (H) 10/14/2016   CRP 3.5 (H) 12/29/2023   CRP 9.0 (H) 12/08/2023   CRP 8.3 (H) 12/09/2020   REPTSTATUS 04/15/2024 FINAL 04/09/2024   GRAMSTAIN  02/19/2021    RARE WBC PRESENT, PREDOMINANTLY PMN NO ORGANISMS SEEN    CULT   04/09/2024    NO GROWTH 5 DAYS Performed at [facility], [address], [place], [place] [address]    [facility] ESCHERICHIA COLI 02/19/2021     Lab Results  Component Value Date   ALBUMIN  2.9 (L) 04/11/2024   ALBUMIN  2.9 (L) 04/10/2024   ALBUMIN  3.2 (L) 04/09/2024    Lab Results  Component Value Date   MG 1.6 (L) 04/09/2024   MG 1.8 12/30/2023   MG 1.9 12/29/2023   Lab Results  Component Value Date   VD25OH 32.29 12/14/2022    No results found for: PREALBUMIN    Latest Ref Rng & Units 04/13/2024    5:05 AM 04/12/2024    4:07 AM 04/11/2024    2:20 AM  CBC EXTENDED  WBC 4.0 - 10.5 K/uL 4.7  5.7  5.2   RBC 4.22 - 5.81 MIL/uL 3.70  3.84  3.46   Hemoglobin 13.0 - 17.0 g/dL 88.8  88.4  89.4   HCT 39.0 - 52.0 % 33.5  34.7  31.3   Platelets 150 - 400 K/uL 133  137  135      There is no height or weight on file to calculate BMI.  Orders:  No orders of the defined types were placed in this encounter.  No orders of the  defined types were placed in this encounter.    Procedures: No procedures performed  Clinical Data: No additional findings.  ROS:  All other systems negative, except as noted in the HPI. Review of Systems  Objective: Vital Signs: There were no vitals taken for this visit.  Specialty Comments:  No specialty comments available.  PMFS History: Patient Active Problem List   Diagnosis Date Noted   Acute kidney injury superimposed on stage 3a chronic kidney disease (HCC) 04/09/2024   Impaired functional mobility, balance, gait, and endurance 04/09/2024   Non-pressure chronic ulcer of other part of left foot limited to breakdown of skin (HCC) 12/27/2023   Type 2 diabetes mellitus without complication, with long-term current use of insulin  (HCC) 12/12/2023   Esophageal dysphagia 12/11/2023   COVID-19 12/11/2023   Acute on chronic hypoxic respiratory failure (HCC) 12/10/2023   Anemia 12/10/2023   Non-ST elevation (NSTEMI) myocardial  infarction (HCC) 12/09/2023   Retroperitoneal bleeding 12/09/2023   History of transcatheter aortic valve replacement (TAVR) 12/09/2023   Chronic heart failure with preserved ejection fraction (HFpEF) (HCC) 12/09/2023   Hx of CABG 12/09/2023   Atherosclerosis of native coronary artery of native heart without angina pectoris 12/09/2023   Hyperkalemia 12/07/2023   Acute on chronic congestive heart failure (HCC) 12/07/2023   Acute kidney injury (nontraumatic) (HCC) 12/06/2023   Acute on chronic combined systolic and diastolic CHF, NYHA class 1 (HCC) 06/22/2023   Acute on chronic systolic (congestive) heart failure (HCC) 06/21/2023   Intermediate stage nonexudative age-related macular degeneration of both eyes 04/25/2022   Research study patient 12/24/2021   Labile blood glucose    Pulmonary fibrosis (HCC)    Hyponatremia    S/P BKA (below knee amputation) unilateral, right (HCC)    Shortness of breath    Atrial fibrillation (HCC)    Postoperative pain    Atherosclerosis of native arteries of extremities with gangrene, right leg (HCC)    Hardware complicating wound infection (HCC)    Abscess of bursa of right ankle 02/15/2021   Endocarditis 01/19/2021   PAD (peripheral artery disease) (HCC) 12/29/2020   Chronic systolic CHF (congestive heart failure) (HCC) 12/22/2020   History of COVID-19 12/22/2020   SSS (sick sinus syndrome) (HCC) 12/22/2020   Acute bacterial endocarditis    MSSA bacteremia 12/09/2020   Diabetic foot ulcer (HCC) 12/09/2020   Foot drop, right foot    Generalized weakness 12/08/2020   Atrial fibrillation, chronic (HCC) 12/08/2020   Elevated troponin level not due myocardial infarction 12/08/2020   Adrenal insufficiency (HCC) 11/14/2020   Orthostatic hypotension 11/14/2020   Physical deconditioning 11/14/2020   Difficulty sleeping 11/07/2020   Right ventricular dysfunction 11/05/2020   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/04/2020   Hearing loss 11/03/2020   Cardiac device in situ  09/09/2020   COVID-19 virus infection 07/18/2020   Coronary artery disease involving native heart without angina pectoris/ S/P CABG x3 07/10/2020   Chronic kidney disease, stage 3a (HCC) 03/29/2020   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/28/2020   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) 03/16/2020   Right epiretinal membrane 03/16/2020   Posterior vitreous detachment of right eye 03/16/2020   Aortic stenosis, moderate 03/12/2020   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   Near syncope 04/27/2018   Hypoglycemia due to insulin  01/06/2018   Essential hypertension 01/06/2018   Syncope and collapse 01/05/2018   Encounter for therapeutic drug monitoring 06/29/2017   OSA on CPAP 05/05/2017   IPF (  idiopathic pulmonary fibrosis) (HCC) 01/05/2017   Abnormal chest x-ray 10/12/2016   Benign neoplasm of colon 01/14/2013   Gastro-esophageal reflux disease without esophagitis 11/24/2009   Past Medical History:  Diagnosis Date   Acute blood loss anemia    Anxiety    AV block, Mobitz 1    Cataract    Chronic kidney disease    d/t DM   CKD (chronic kidney disease), stage III (HCC)    COVID-19 virus infection 07/18/2020   Last Assessment & Plan:   Formatting of this note might be different from the original.  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 10/2019 Echo: EF 55-60%, Gr2 DD. Nl RV.    Neuromuscular disorder (HCC)    Neuropathy associated with endocrine disorder (HCC)     Nonobstructive CAD (coronary artery disease)    a. 2012 Cath: mod, nonobs dzs; b. 10/2016 MV: EF 60%, no ischemia.   OSA on CPAP 05/05/2017   Unattended Home Sleep Test 7/2/813-AHI 38.6/hour, desaturation to 64%, body weight 261 pounds   PONV (postoperative nausea and vomiting)    Postoperative anemia due to acute blood loss 11/07/2020   Postoperative hemorrhagic shock 03/20/2021   Sleep apnea     uses cpap asked to bring mask and tubing    Family History  Problem Relation Age of Onset   Diabetes Mellitus II Mother    Emphysema Father 70   Heart attack Father    Colon cancer Neg Hx    Esophageal cancer Neg Hx    Rectal cancer Neg Hx    Stomach cancer Neg Hx     Past Surgical History:  Procedure Laterality Date   ABDOMINAL AORTOGRAM W/LOWER EXTREMITY N/A 12/10/2020   Procedure: ABDOMINAL AORTOGRAM W/LOWER EXTREMITY;  Surgeon: Magda Debby SAILOR, MD;  Location: [facility]IVE CV LAB;  Service: Cardiovascular;  Laterality: N/A;   AMPUTATION Right 01/22/2021   Procedure: 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;  Surgeon: Harden Jerona GAILS, MD;  Location: [facility] OR;  Service: Orthopedics;  Laterality: Right;   AMPUTATION Right 03/17/2021   Procedure: RIGHT BELOW KNEE AMPUTATION;  Surgeon: Harden Jerona GAILS, MD;  Location: [facility] OR;  Service: Orthopedics;  Laterality: Right;   ANKLE FUSION Right 01/22/2021   Procedure: RIGHT FOOT TIBIOCALCANEAL FUSION;  Surgeon: Harden Jerona GAILS, MD;  Location: [facility] OR;  Service: Orthopedics;  Laterality: Right;   ANTERIOR FUSION CERVICAL SPINE  2012   CARDIAC CATHETERIZATION  2011   CARDIAC CATHETERIZATION N/A 11/09/2016   Procedure: Right Heart Cath;  Surgeon: Victory LELON Sharps, MD;  Location: [facility] INVASIVE CV LAB;  Service: Cardiovascular;  Laterality: N/A;   carpel tunnel     left wrist   CATARACT EXTRACTION     CATARACT EXTRACTION W/ INTRAOCULAR LENS  IMPLANT, BILATERAL  2013   CERVICAL LAMINECTOMY  2012   COLONOSCOPY N/A 01/14/2013   Procedure: COLONOSCOPY;  Surgeon: Norleen SAILOR Kiang, MD;   Location: [facility] ENDOSCOPY;  Service: Endoscopy;  Laterality: N/A;  CORONARY ARTERY BYPASS GRAFT  11/04/2020   LIMA-LAD, SVG-OM1, SVG-PDA (Dr Norleen Exon [facility]) dc 11/18/2020   EYE SURGERY     I & D EXTREMITY Right 02/19/2021   Procedure: RIGHT ANKLE DEBRIDEMENT AND PLACEMENT ANTIBIOTIC BEADS;  Surgeon: Harden Jerona GAILS, MD;  Location: [facility] OR;  Service: Orthopedics;  Laterality: Right;   KNEE SURGERY  1998   left   LEFT HEART CATH AND CORONARY ANGIOGRAPHY N/A 07/10/2020   Procedure: LEFT HEART CATH AND CORONARY ANGIOGRAPHY;  Surgeon: Wonda Sharper, MD;  Location: [facility] INVASIVE CV LAB;  Service: Cardiovascular;  Laterality: N/A;   LUMBAR LAMINECTOMY  2003   LUNG BIOPSY Left 12/26/2016   Procedure: LUNG BIOPSY;  Surgeon: Elspeth JAYSON Millers, MD;  Location: [facility] OR;  Service: Thoracic;  Laterality: Left;   PACEMAKER IMPLANT N/A 03/30/2020   Procedure: PACEMAKER IMPLANT;  Surgeon: Waddell Danelle ORN, MD;  Location: [facility];  Service: Cardiovascular;  Laterality: N/A;   PERIPHERAL VASCULAR INTERVENTION Right 12/10/2020   Procedure: PERIPHERAL VASCULAR INTERVENTION;  Surgeon: Magda Debby SAILOR, MD;  Location: [facility];  Service: Cardiovascular;  Laterality: Right;  SFA   POSTERIOR FUSION CERVICAL SPINE  2012   PPM GENERATOR REMOVAL N/A 12/14/2020   Procedure: PPM GENERATOR REMOVAL;  Surgeon: Waddell Danelle ORN, MD;  Location: [facility];  Service: Cardiovascular;  Laterality: N/A;   TEE WITHOUT CARDIOVERSION N/A 12/11/2020   Procedure: TRANSESOPHAGEAL ECHOCARDIOGRAM (TEE);  Surgeon: Barbaraann Darryle Debby, MD;  Location: [facility] ENDOSCOPY;  Service: Cardiovascular;  Laterality: N/A;   TRIGGER FINGER RELEASE  2011   4th finger left hand   VIDEO ASSISTED THORACOSCOPY Left 12/26/2016   Procedure: VIDEO ASSISTED THORACOSCOPY;  Surgeon: Elspeth JAYSON Millers, MD;  Location: [facility] OR;  Service: Thoracic;  Laterality: Left;   VIDEO BRONCHOSCOPY N/A 12/26/2016   Procedure: VIDEO BRONCHOSCOPY;  Surgeon: Elspeth JAYSON Millers, MD;  Location: [facility] OR;  Service: Thoracic;  Laterality: N/A;   Social History   Occupational History   Not on file  Tobacco Use   Smoking status: Never    Passive exposure: Past   Smokeless tobacco: Never  Vaping Use   Vaping status: Never Used  Substance and Sexual Activity   Alcohol use: No   Drug use: No   Sexual activity: Not Currently

## 2024-06-12 NOTE — Progress Notes (Signed)
 OV 05/09/2024  Subjective:  Patient ID: Shane [name], male , DOB: October 14, 1941 , age 83 y.o. , MRN: [id number] , ADDRESS: [address] [place] [address] PCP Shepard Ade, MD Patient Care Team: Shepard Ade, MD as PCP - General (Internal Medicine) Shepard Ade, MD as PCP - Internal Medicine (Internal Medicine) Pietro Redell RAMAN, MD as PCP - Cardiology (Cardiology) Waddell Danelle ORN, MD as PCP - Electrophysiology (Cardiology) Caleen Dirks, MD as Consulting Physician (Internal Medicine)  This Provider for this visit: Treatment Team:  Attending Provider: Geronimo Amel, MD    05/09/2024 -   Chief Complaint  Patient presents with   Follow-up    F/u ct     HPI [facility] 83 y.o. 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  Not seen him in a year.  In January 2025 for he had COVID-19 and associated with this he also had retroperitoneal bleed and then he had to have CRRT/hemodialysis for a few days.  He recovered from that.  He was in rehab.  Iin May 2025 had VQ scan positivity for pulmonary embolism and admitted with that syncope.  He is also recovered from that.  His anemia is nearly resolved.  His kidney function shows chronic kidney disease.  He is stable but he says that since the COVID he is more short of breath even with minimal exertion.  He does not have portable oxygen.  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  He is also having significant cough.  Even minimal exertion makes him short of breath.  His last high-resolution CT chest was April 2024.  Current labs reviewed.  s.  Med review shows he is on Eliquis  for pulmonary embolism.  May 2025 echocardiogram shows drop in ejection fraction to 35-40% [the year prior he was 50%].  He is also on lisinopril .     OV 06/12/2024  Subjective:  Patient ID: Shane [name], male , DOB: Apr 26, 1941 , age 81 y.o. , MRN: [id number] , ADDRESS: [address] [place] [address] PCP Shepard Ade, MD Patient Care Team: Shepard Ade, MD as PCP - General (Internal Medicine) Shepard Ade, MD as PCP - Internal Medicine (Internal Medicine) Pietro Redell RAMAN, MD as PCP - Cardiology (Cardiology) Waddell Danelle ORN, MD as PCP - Electrophysiology (Cardiology) Caleen Dirks, MD as Consulting Physician (Internal Medicine)  This Provider for this visit: Treatment Team:  Attending Provider: Geronimo Amel, MD    06/12/2024 -   Chief Complaint  Patient presents with   Follow-up    Breathing has improved since last visit. He has very min, dry cough. Review HRCT done 05/31/24.    #Idiopathic pulmonary fibrosis presents for follow-up.  He has multiple medical problems.  And this includes COVID early in the pandemic and a repeat COVID in 2025 January that was complicated by retroperitoneal bleed and requiring CRRT.  May 2025 pulmonary embolism.  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  He also has poorly controlled diabetes.  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  He has very mild baseline anemia    HPI Shane [name] 83 y.o. -last visit was a month ago.  At the time he  was complaining of increased shortness of breath since his COVID earlier this year.  He was also complaining of cough.  Noticed that his ejection fraction had dropped to 35-40%.  1 year ago it was 50%.  Today he tells me that his shortness of breath is improved [see symptom score below] but is still there.  Is only minimally improved.  However his cough is completely resolved.  So we did a high-resolution CT chest that I personally visualized.  There is no change or progression this in the last 1 year.  In the  interim he did purchase out-of-pocket oxygen concentrator and portable oxygen from [employer].  This is only $300.  It is really helping him with exertion.     SYMPTOM SCALE - ILD 05/09/2024 06/12/2024 Using out-of-pocket purchased oxygen for exertion and at night  Current weight    O2 use Using only at night   Shortness of Breath 0 -> 5 scale with 5 being worst (score 6 If unable to do)   At rest 1 0  Simple tasks - showers, clothes change, eating, shaving 2 3  Household (dishes, doing bed, laundry) 3 2  Shopping 1 2  Walking level at own pace 5 4  Walking up Stairs 5 4  Total (30-36) Dyspnea Score 17 15  How bad is your cough? 4 1  How bad is your fatigue 5 4  How bad is nausea 0 0  How bad is vomiting?  0 0  How bad is diarrhea? 0 0  How bad is anxiety? 1 1  How bad is depression 1 1  Any chronic pain - if so where and how bad 0 0    CT Chest data from date: 06/05/2024  - personally visualized and independently interpreted : yes - my findings are: abs blow Narrative & Impression  CLINICAL DATA:  Interstitial lung disease, idiopathic pulmonary fibrosis. Chronic cough, shortness of breath on exertion. Chronic systolic heart failure.   EXAM: CT CHEST WITHOUT CONTRAST   TECHNIQUE: Multidetector CT imaging of the chest was performed following the standard protocol without intravenous contrast. High resolution imaging of the lungs, as well as inspiratory and expiratory imaging, was performed.   RADIATION DOSE REDUCTION: This exam was performed according to the departmental dose-optimization program which includes automated exposure control, adjustment of the mA and/or kV according to patient size and/or use of iterative reconstruction technique.   COMPARISON:  03/16/2023.   FINDINGS: Cardiovascular: Atherosclerotic calcification of the aorta, aortic valve and coronary arteries. Aortic valve replacement. Ascending aorta measures 4.1 cm (coronal image 55), stable. Enlarged  pulmonic trunk and heart. No pericardial effusion.   Mediastinum/Nodes: No pathologically enlarged mediastinal or axillary lymph nodes. Hilar regions are difficult to definitively evaluate without IV contrast. Esophagus is grossly unremarkable.   Lungs/Pleura: Traction bronchiectasis/bronchiolectasis with interstitial thickening and subpleural reticulation and ground-glass, somewhat upper and midlung zone predominant. Mild air trapping. Findings are unchanged from 03/16/2023. No suspicious pulmonary nodules. No pleural fluid. Airway is unremarkable.   Upper Abdomen: Visualized portions of the liver, gallbladder, adrenal glands, kidneys, spleen, pancreas, stomach and bowel are grossly unremarkable. No upper abdominal adenopathy.   Musculoskeletal: Degenerative changes in the spine.   IMPRESSION: 1. 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 Findings are suggestive of an alternative diagnosis (not UIP) per consensus guidelines: Diagnosis of Idiopathic Pulmonary Fibrosis: An Official ATS/ERS/JRS/ALAT Clinical Practice Guideline. Am J Respir  Crit Care Med Vol 198, Iss 5, [id number], Aug 05 2017. 2. 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 Recommend annual imaging followup by CTA or MRA. This recommendation follows 2010 ACCF/AHA/AATS/ACR/ASA/SCA/SCAI/SIR/STS/SVM Guidelines for the Diagnosis and Management of Patients with Thoracic Aortic Disease. Circulation. 2010; 121: Z733-z630. Aortic aneurysm NOS (ICD10-I71.9). 3. Aortic atherosclerosis (ICD10-I70.0). Coronary artery calcification. 4. Enlarged pulmonic trunk, indicative of pulmonary arterial hypertension.     Electronically Signed   By: Newell Eke M.D.   On: 06/05/2024 14:44    PFT     Latest Ref Rng & Units 09/23/2022   12:29 PM 09/21/2018   12:08 PM 05/23/2018   10:37  AM 12/25/2017    9:01 AM 10/04/2017    9:35 AM 04/06/2017   10:13 AM 10/14/2016    8:19 AM  PFT Results  FVC-Pre L 2.27   2.89  P 2.88  2.94  2.94  3.00   FVC-Predicted Pre % 52   61  P 61  62  62  63   FVC-Post L       3.00   FVC-Predicted Post %       63   Pre FEV1/FVC % % 89   91  P 86  92  91  90   Post FEV1/FCV % %       92   FEV1-Pre L 2.01   2.62  P 2.47  2.69  2.69  2.70   FEV1-Predicted Pre % 65   77  P 72  79  78  78   FEV1-Post L       2.75   DLCO uncorrected ml/min/mmHg 11.84  14.95  17.13  P 14.56  15.04  16.43  16.82   DLCO UNC% % 45  42  47  P 40  41  45  46   DLCO corrected ml/min/mmHg 11.84  15.26  17.33  P 15.57  15.31  19.35    DLCO COR %Predicted % 45  43  47  P 42  42  53    DLVA Predicted % 97  83  91  P 83  79  99  81   TLC L       4.62   TLC % Predicted %       60   RV % Predicted %       60     P Preliminary result       LAB RESULTS last 96 hours No results found.       has a past medical history of Acute blood loss anemia, Anxiety, AV block, Mobitz 1, Cataract, Chronic kidney disease, CKD (chronic kidney disease), stage III (HCC), COVID-19 virus infection (07/18/2020), Depression, Diabetes mellitus, Dyspnea, GERD (gastroesophageal reflux disease), History of kidney stones, Hyperlipidemia, Hypertension, Idiopathic pulmonary fibrosis (HCC) (11/2016), ILD (interstitial lung disease) (HCC), Moderate aortic stenosis, Neuromuscular disorder (HCC), Neuropathy associated with endocrine disorder (HCC), Nonobstructive CAD (coronary artery disease), OSA on CPAP (05/05/2017), PONV (postoperative nausea and vomiting), Postoperative anemia due to acute blood loss (11/07/2020), Postoperative hemorrhagic shock (03/20/2021), and Sleep apnea.   reports that he has never smoked. He has been exposed to tobacco smoke. He has never used smokeless tobacco.  Past Surgical History:  Procedure Laterality Date   ABDOMINAL AORTOGRAM W/LOWER EXTREMITY N/A 12/10/2020   Procedure:  ABDOMINAL AORTOGRAM W/LOWER EXTREMITY;  Surgeon: Magda Debby SAILOR, MD;  Location: [facility] INVASIVE CV LAB;  Service: Cardiovascular;  Laterality: N/A;   AMPUTATION Right 01/22/2021   Procedure: RIGHT  5TH RAY AMPUTATION;  Surgeon: Harden Jerona GAILS, MD;  Location: [facility] OR;  Service: Orthopedics;  Laterality: Right;   AMPUTATION Right 03/17/2021   Procedure: RIGHT BELOW KNEE AMPUTATION;  Surgeon: Harden Jerona GAILS, MD;  Location: [facility];  Service: Orthopedics;  Laterality: Right;   ANKLE FUSION Right 01/22/2021   Procedure: RIGHT FOOT TIBIOCALCANEAL FUSION;  Surgeon: Harden Jerona GAILS, MD;  Location: [facility] OR;  Service: Orthopedics;  Laterality: Right;   ANTERIOR FUSION CERVICAL SPINE  2012   CARDIAC CATHETERIZATION  2011   CARDIAC CATHETERIZATION N/A 11/09/2016   Procedure: Right Heart Cath;  Surgeon: Victory LELON Sharps, MD;  Location: [facility] INVASIVE CV LAB;  Service: Cardiovascular;  Laterality: N/A;   carpel tunnel     left wrist   CATARACT EXTRACTION     CATARACT EXTRACTION W/ INTRAOCULAR LENS  IMPLANT, BILATERAL  2013   CERVICAL LAMINECTOMY  2012   COLONOSCOPY N/A 01/14/2013   Procedure: COLONOSCOPY;  Surgeon: Norleen LOISE Kiang, MD;  Location: [facility] ENDOSCOPY;  Service: Endoscopy;  Laterality: N/A;   CORONARY ARTERY BYPASS GRAFT  11/04/2020   LIMA-LAD, SVG-OM1, SVG-PDA (Dr Norleen Exon [facility]) dc 11/18/2020   EYE SURGERY     I & D EXTREMITY Right 02/19/2021   Procedure: RIGHT ANKLE DEBRIDEMENT AND PLACEMENT ANTIBIOTIC BEADS;  Surgeon: Harden Jerona GAILS, MD;  Location: [facility] OR;  Service: Orthopedics;  Laterality: Right;   KNEE SURGERY  1998   left   LEFT HEART CATH AND CORONARY ANGIOGRAPHY N/A 07/10/2020   Procedure: LEFT HEART CATH AND CORONARY ANGIOGRAPHY;  Surgeon: Wonda Sharper, MD;  Location: [facility] INVASIVE CV LAB;  Service: Cardiovascular;  Laterality: N/A;   LUMBAR LAMINECTOMY  2003   LUNG BIOPSY Left 12/26/2016   Procedure: LUNG BIOPSY;  Surgeon: Elspeth JAYSON Millers, MD;  Location: [facility] OR;  Service: Thoracic;  Laterality: Left;    PACEMAKER IMPLANT N/A 03/30/2020   Procedure: PACEMAKER IMPLANT;  Surgeon: Waddell Danelle LELON, MD;  Location: [facility];  Service: Cardiovascular;  Laterality: N/A;   PERIPHERAL VASCULAR INTERVENTION Right 12/10/2020   Procedure: PERIPHERAL VASCULAR INTERVENTION;  Surgeon: Magda Debby LOISE, MD;  Location: [facility];  Service: Cardiovascular;  Laterality: Right;  SFA   POSTERIOR FUSION CERVICAL SPINE  2012   PPM GENERATOR REMOVAL N/A 12/14/2020   Procedure: PPM GENERATOR REMOVAL;  Surgeon: Waddell Danelle LELON, MD;  Location: [facility] LAB;  Service: Cardiovascular;  Laterality: N/A;   TEE WITHOUT CARDIOVERSION N/A 12/11/2020   Procedure: TRANSESOPHAGEAL ECHOCARDIOGRAM (TEE);  Surgeon: Barbaraann Darryle Debby, MD;  Location: [facility] ENDOSCOPY;  Service: Cardiovascular;  Laterality: N/A;   TRIGGER FINGER RELEASE  2011   4th finger left hand   VIDEO ASSISTED THORACOSCOPY Left 12/26/2016   Procedure: VIDEO ASSISTED THORACOSCOPY;  Surgeon: Elspeth JAYSON Millers, MD;  Location: [facility] OR;  Service: Thoracic;  Laterality: Left;   VIDEO BRONCHOSCOPY N/A 12/26/2016   Procedure: VIDEO BRONCHOSCOPY;  Surgeon: Elspeth JAYSON Millers, MD;  Location: [facility] OR;  Service: Thoracic;  Laterality: N/A;    Allergies  Allergen Reactions   Codeine Hives and Itching   Ofev  [Nintedanib ] Diarrhea    SEVERE DIARRHEA   Pirfenidone  Diarrhea and Other (See Comments)    Esbriet  (Pirfenidone ) causes elevated LFTs. D/C on 06/14/17 and SEVERE DIARRHEA     Immunization History  Administered Date(s) Administered   Fluzone Influenza virus vaccine,trivalent (IIV3), split virus 10/07/2010, 08/31/2011, 10/12/2012, 12/05/2012, 08/29/2013, 10/31/2014   Influenza Split 08/14/2017   Influenza, High Dose Seasonal PF 08/05/2016, 09/01/2016,  09/02/2019, 09/05/2022   Influenza, Mdck, Trivalent,PF 6+ MOS(egg free) 08/23/2017   Influenza, Quadrivalent, Recombinant, Inj, Pf 08/29/2018, 08/23/2019, 09/19/2020, 08/28/2023   Influenza,inj,Quad  PF,6+ Mos 10/31/2014, 08/29/2015   Influenza-Unspecified 10/13/2020   PFIZER Comirnaty(Gray Top)Covid-19 Tri-Sucrose Vaccine 09/27/2020   PFIZER(Purple Top)SARS-COV-2 Vaccination 12/28/2019, 01/18/2020, 09/27/2020   Pneumococcal Conjugate-13 01/14/2015   Pneumococcal Polysaccharide-23 11/26/2009, 12/05/2014   Td (Adult) 12/13/2013   Td (Adult),5 Lf Tetanus Toxid, Preservative Free 10/07/2010   Zoster, Live 01/14/2011    Family History  Problem Relation Age of Onset   Diabetes Mellitus II Mother    Emphysema Father 84   Heart attack Father    Colon cancer Neg Hx    Esophageal cancer Neg Hx    Rectal cancer Neg Hx    Stomach cancer Neg Hx      Current Outpatient Medications:    Acetaminophen  (TYLENOL  PO), Take 2 tablets by mouth as needed. May take 2 tablet by mouth 3 hours after taking the trazodone  75mg  prn., Disp: , Rfl:    albuterol  (PROVENTIL ) (2.5 MG/3ML) 0.083% nebulizer solution, Take 2.5 mg by nebulization 2 (two) times daily as needed for wheezing or shortness of breath., Disp: , Rfl:    albuterol  (VENTOLIN  HFA) 108 (90 Base) MCG/ACT inhaler, Inhale 2 puffs into the lungs every 4 (four) hours as needed for wheezing or shortness of breath. (Patient taking differently: Inhale 3 puffs into the lungs every 4 (four) hours as needed for wheezing or shortness of breath.), Disp: 6.7 g, Rfl: 0   APIXABAN  (ELIQUIS ) VTE STARTER PACK (10MG  AND 5MG ), Take as directed on package: start with two-5mg  tablets twice daily for 7 days. On day 8, switch to one-5mg  tablet twice daily., Disp: 74 each, Rfl: 0   Ascorbic Acid  (VITAMIN C  WITH ROSE HIPS) 500 MG tablet, Take 1 tablet (500 mg total) by mouth daily. (Patient taking differently: Take 500 mg by mouth in the morning.), Disp: 30 tablet, Rfl: 0   atorvastatin  (LIPITOR ) 80 MG tablet, TAKE 1 TABLET BY MOUTH EVERY DAY (Patient taking differently: Take 80 mg by mouth at bedtime.), Disp: 90 tablet, Rfl: 3   benzonatate  (TESSALON ) 100 MG capsule, Take  1 capsule (100 mg total) by mouth 3 (three) times daily. (Patient taking differently: Take 100 mg by mouth as needed for cough.), Disp: 20 capsule, Rfl: 0   cyanocobalamin  1000 MCG tablet, Take 1 tablet (1,000 mcg total) by mouth daily. (Patient taking differently: Take 1,000 mcg by mouth in the morning.), Disp: 30 tablet, Rfl: 3   dextromethorphan  (DELSYM ) 30 MG/5ML liquid, Take 2.5 mLs (15 mg total) by mouth 2 (two) times daily. (Patient taking differently: Take 15 mg by mouth at bedtime.), Disp: 89 mL, Rfl: 0   docusate sodium  (COLACE) 100 MG capsule, Take 1 capsule (100 mg total) by mouth 2 (two) times daily. (Patient taking differently: Take 100 mg by mouth as needed for mild constipation or moderate constipation.), Disp: 10 capsule, Rfl: 0   escitalopram  (LEXAPRO ) 10 MG tablet, Take 1 tablet (10 mg total) by mouth daily. (Patient taking differently: Take 10 mg by mouth in the morning.), Disp: 30 tablet, Rfl: 0   furosemide  (LASIX ) 40 MG tablet, Take 1 tablet (40 mg total) by mouth 2 (two) times daily., Disp: , Rfl:    gabapentin  (NEURONTIN ) 100 MG capsule, Take 1 capsule (100 mg total) by mouth 3 (three) times daily., Disp: 90 capsule, Rfl: 0   HUMALOG  KWIKPEN 100 UNIT/ML KwikPen, Inject 10-15 Units into the skin 3 (  three) times daily. Can take 15-18 Units with meals while on Prednisone  for 3days, Disp: , Rfl:    HYDROcodone  bit-homatropine (HYCODAN) 5-1.5 MG/5ML syrup, Take 5 mLs by mouth every 6 (six) hours as needed for cough., Disp: 120 mL, Rfl: 0   HYDROcodone -acetaminophen  (NORCO/VICODIN) 5-325 MG tablet, Take 1 tablet by mouth at bedtime as needed., Disp: , Rfl:    hydrOXYzine (ATARAX) 25 MG tablet, Take 25 mg by mouth., Disp: , Rfl:    lisinopril  (ZESTRIL ) 10 MG tablet, Take 10 mg by mouth daily., Disp: , Rfl:    LORazepam  (ATIVAN ) 1 MG tablet, Take 1 tablet (1 mg total) by mouth at bedtime as needed for sleep., Disp: 3 tablet, Rfl: 0   Magnesium  200 MG TABS, 1 tablet at bedtime Orally  Once a day, Disp: , Rfl:    methocarbamol  (ROBAXIN ) 500 MG tablet, Take 1 tablet (500 mg total) by mouth every 8 (eight) hours as needed for muscle spasms., Disp: 30 tablet, Rfl: 0   Multiple Vitamin (MULTIVITAMIN WITH MINERALS) TABS tablet, Take 1 tablet by mouth in the morning., Disp: , Rfl:    mupirocin  ointment (BACTROBAN ) 2 %, Apply 1 Application topically at bedtime., Disp: , Rfl:    Nutritional Supplements (FEEDING SUPPLEMENT, NEPRO CARB STEADY,) LIQD, Take 237 mLs by mouth 2 (two) times daily between meals., Disp: , Rfl:    Olopatadine HCl (PATADAY OP), Place 2 drops into both eyes daily as needed (allergies)., Disp: , Rfl:    ondansetron  (ZOFRAN ) 4 MG tablet, Take 4 mg by mouth as needed for nausea or vomiting., Disp: , Rfl:    OXYGEN, Inhale 3 L into the lungs See admin instructions. Inhale 3L of Oxygen everynight at bedside. May uses throughout the day as needed., Disp: , Rfl:    pantoprazole  (PROTONIX ) 40 MG tablet, Take 1 tablet (40 mg total) by mouth 2 (two) times daily., Disp: 60 tablet, Rfl: 0   polyethylene glycol powder (GLYCOLAX /MIRALAX ) 17 GM/SCOOP powder, Take 17 g by mouth as needed for mild constipation or moderate constipation., Disp: , Rfl:    traZODone  (DESYREL ) 50 MG tablet, Take 75 mg by mouth at bedtime as needed for sleep., Disp: , Rfl:    TRESIBA  FLEXTOUCH 100 UNIT/ML FlexTouch Pen, Inject 25 Units into the skin in the morning., Disp: , Rfl:    zinc  sulfate 220 (50 Zn) MG capsule, Take 1 capsule (220 mg total) by mouth daily. (Patient taking differently: Take 220 mg by mouth in the morning.), Disp: 30 capsule, Rfl: 0      Objective:   There were no vitals filed for this visit.  Estimated body mass index is 31.57 kg/m as calculated from the following:   Height as of 05/09/24: 6' (1.829 m).   Weight as of 05/09/24: 232 lb 12.8 oz (105.6 kg).  @WEIGHTCHANGE @  There were no vitals filed for this visit.   Physical Exam   General: No distress. Looks beter O2 at  rest: no Cane present: no Sitting in wheel chair: no Frail: non Obese: on Neuro: Alert and Oriented x 3. GCS 15. Speech normal Psych: Pleasant Resp:  Barrel Chest - o.  Wheeze - no, Crackles - noYES, No overt respiratory distress CVS: Normal heart sounds. Murmurs - no Ext: Stigmata of Connective Tissue Disease - NO right lower extremity prosthesis and left foot metatarsal amputation. HEENT: Normal upper airway. PEERL +. No post nasal drip        Assessment:       ICD-10-CM  1. 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HCC)  J84.112     2. Chronic cough  R05.3     3. DOE (dyspnea on exertion)  R06.09     4. Chronic systolic CHF (congestive heart failure) (HCC)  I50.22     5. History of COVID-19  Z86.16     6. History of pulmonary embolism  Z86.711      Dyspnea is multifactorial from chronic systolic heart failure of and fibrosis with the heart failure component being relatively newer in terms of drop in ejection fraction.  Recommend oxygen and symptom support care.  Also recommended that he consider hyperbaric oxygen for his nonhealing left metatarsal foot.  He will require pulmonary clearance at that time.  An improved wound would give him more mobility.    Plan:     Patient Instructions    - Shortness of breath  - Glad things have better but residual shortness of breath is because of combination of chronic systolic heart failure and also pulmonary fibrosis and physical deconditioning  - Glad oxygen [purchased out-of-pocket] is helping you    #Chronic cough:  - Glad this is completely resolved   #Pulmonary fibrosis  - Definitely stable in the last 1 year on CT scan  PLAN - -Continue oxygen, physical exercise and cardiac optimization -You can consider hyperbaric oxygen therapy for your wound if you are interested - Continue to monitor pulmonary fibrosis and consider antifibrotic's if there is any change in fibrosis  Followup -4-5 months 15-minute visit  follow-up   FOLLOWUP Return in about 18 weeks (around 10/16/2024) for 15 min visit, ILD, with Dr Geronimo, Face to Face Visit.   (Level 04 E&M 2024: Estb >= 30 min  pent in total care time and counseling or/and coordination of care by this undersigned MD - Dr Dorethia Geronimo. This includes one or more of the following on this same day 06/12/2024: pre-charting, chart review, note writing, documentation discussion of test results, diagnostic or treatment recommendations, prognosis, risks and benefits of management options, instructions, education, compliance or risk-factor reduction. It excludes time spent by the CMA or office staff in the care of the patient . Actual time is 31 min)    SIGNATURE    Dr. Dorethia Geronimo, M.D., F.C.C.P,  Pulmonary and Critical Care Medicine Staff Physician, [facility] Director - Interstitial Lung Disease  Program  Pulmonary Fibrosis [facility] at [facility] [place], [place], [address]  Pager: [phone], If no answer or between  15:00h - 7:00h: call [phone] Telephone: [phone]  3:34 PM 06/12/2024

## 2024-06-20 ENCOUNTER — Ambulatory Visit: Admitting: Physician Assistant

## 2024-06-20 DIAGNOSIS — L97521 Non-pressure chronic ulcer of other part of left foot limited to breakdown of skin: Secondary | ICD-10-CM | POA: Diagnosis not present

## 2024-06-21 ENCOUNTER — Encounter: Payer: Self-pay | Admitting: Physician Assistant

## 2024-06-21 ENCOUNTER — Telehealth: Payer: Self-pay | Admitting: Orthopedic Surgery

## 2024-06-21 NOTE — Progress Notes (Signed)
 Office Visit Note   Patient: Shane [name]           Date of Birth: 1941-02-20           MRN: [id number] Visit Date: 06/20/2024              Requested by: Shepard Ade, MD [address] [place],  [place] [address] PCP: Shepard Ade, MD  Chief Complaint  Patient presents with   Left Foot - Wound Check      HPI: The patient is an 83 year old gentleman seen in follow-up for ulcers over the dorsum of the left foot as well as the lateral column history of transmetatarsal amputation on the left.   Assessment & Plan: Visit Diagnoses:  1. Non-pressure chronic ulcer of other part of left foot limited to breakdown of skin (HCC)     Plan: Vashe dressing changes daily.  Elevation for edema.  Compression with ace wrap.  Pending wound appearance may apply Kerecis next visit.    Follow-Up Instructions: Return in about 2 weeks (around 07/04/2024).   Ortho Exam  Patient is alert, oriented, no adenopathy, well-dressed, normal affect, normal respiratory effort. 10 blade debridement lateral foot wound, 30% granulation , dorsum of foot 100% granulation.      Imaging: No results found. No images are attached to the encounter.  Labs: Lab Results  Component Value Date   HGBA1C 8.7 (H) 04/09/2024   HGBA1C 8.5 (H) 12/06/2023   HGBA1C 9.0 (H) 06/24/2023   ESRSEDRATE 84 (H) 12/29/2023   ESRSEDRATE 22 (H) 10/14/2016   CRP 3.5 (H) 12/29/2023   CRP 9.0 (H) 12/08/2023   CRP 8.3 (H) 12/09/2020   REPTSTATUS 04/15/2024 FINAL 04/09/2024   GRAMSTAIN  02/19/2021    RARE WBC PRESENT, PREDOMINANTLY PMN NO ORGANISMS SEEN    CULT  04/09/2024    NO GROWTH 5 DAYS Performed at [facility], [address], [place], [place] [address]    [facility] ESCHERICHIA COLI 02/19/2021     Lab Results  Component Value Date   ALBUMIN  2.9 (L) 04/11/2024   ALBUMIN  2.9 (L) 04/10/2024   ALBUMIN  3.2 (L) 04/09/2024    Lab Results  Component Value Date   MG 1.6 (L) 04/09/2024   MG 1.8  12/30/2023   MG 1.9 12/29/2023   Lab Results  Component Value Date   VD25OH 32.29 12/14/2022    No results found for: PREALBUMIN    Latest Ref Rng & Units 04/13/2024    5:05 AM 04/12/2024    4:07 AM 04/11/2024    2:20 AM  CBC EXTENDED  WBC 4.0 - 10.5 K/uL 4.7  5.7  5.2   RBC 4.22 - 5.81 MIL/uL 3.70  3.84  3.46   Hemoglobin 13.0 - 17.0 g/dL 88.8  88.4  89.4   HCT 39.0 - 52.0 % 33.5  34.7  31.3   Platelets 150 - 400 K/uL 133  137  135      There is no height or weight on file to calculate BMI.  Orders:  No orders of the defined types were placed in this encounter.  No orders of the defined types were placed in this encounter.    Procedures: No procedures performed  Clinical Data: No additional findings.  ROS:  All other systems negative, except as noted in the HPI. Review of Systems  Objective: Vital Signs: There were no vitals taken for this visit.  Specialty Comments:  No specialty comments available.  PMFS History: Patient Active  Problem List   Diagnosis Date Noted   Acute kidney injury superimposed on stage 3a chronic kidney disease (HCC) 04/09/2024   Impaired functional mobility, balance, gait, and endurance 04/09/2024   Non-pressure chronic ulcer of other part of left foot limited to breakdown of skin (HCC) 12/27/2023   Type 2 diabetes mellitus without complication, with long-term current use of insulin  (HCC) 12/12/2023   Esophageal dysphagia 12/11/2023   COVID-19 12/11/2023   Acute on chronic hypoxic respiratory failure (HCC) 12/10/2023   Anemia 12/10/2023   Non-ST elevation (NSTEMI) myocardial infarction (HCC) 12/09/2023   Retroperitoneal bleeding 12/09/2023   History of transcatheter aortic valve replacement (TAVR) 12/09/2023   Chronic heart failure with preserved ejection fraction (HFpEF) (HCC) 12/09/2023   Hx of CABG 12/09/2023   Atherosclerosis of native coronary artery of native heart without angina pectoris 12/09/2023   Hyperkalemia 12/07/2023    Acute on chronic congestive heart failure (HCC) 12/07/2023   Acute kidney injury (nontraumatic) (HCC) 12/06/2023   Acute on chronic combined systolic and diastolic CHF, NYHA class 1 (HCC) 06/22/2023   Acute on chronic systolic (congestive) heart failure (HCC) 06/21/2023   Intermediate stage nonexudative age-related macular degeneration of both eyes 04/25/2022   Research study patient 12/24/2021   Labile blood glucose    Pulmonary fibrosis (HCC)    Hyponatremia    S/P BKA (below knee amputation) unilateral, right (HCC)    Shortness of breath    Atrial fibrillation (HCC)    Postoperative pain    Atherosclerosis of native arteries of extremities with gangrene, right leg (HCC)    Hardware complicating wound infection (HCC)    Abscess of bursa of right ankle 02/15/2021   Endocarditis 01/19/2021   PAD (peripheral artery disease) (HCC) 12/29/2020   Chronic systolic CHF (congestive heart failure) (HCC) 12/22/2020   History of COVID-19 12/22/2020   SSS (sick sinus syndrome) (HCC) 12/22/2020   Acute bacterial endocarditis    MSSA bacteremia 12/09/2020   Diabetic foot ulcer (HCC) 12/09/2020   Foot drop, right foot    Generalized weakness 12/08/2020   Atrial fibrillation, chronic (HCC) 12/08/2020   Elevated troponin level not due myocardial infarction 12/08/2020   Adrenal insufficiency (HCC) 11/14/2020   Orthostatic hypotension 11/14/2020   Physical deconditioning 11/14/2020   Difficulty sleeping 11/07/2020   Right ventricular dysfunction 11/05/2020   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/04/2020   Hearing loss 11/03/2020   Cardiac device in situ 09/09/2020   COVID-19 virus infection 07/18/2020   Coronary artery disease involving native heart without angina pectoris/ S/P CABG x3 07/10/2020   Chronic kidney disease, stage 3a (HCC) 03/29/2020   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/28/2020   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) 03/16/2020   Right epiretinal  membrane 03/16/2020   Posterior vitreous detachment of right eye 03/16/2020   Aortic stenosis, moderate 03/12/2020   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   Near syncope 04/27/2018   Hypoglycemia due to insulin  01/06/2018   Essential hypertension 01/06/2018   Syncope and collapse 01/05/2018   Encounter for therapeutic drug monitoring 06/29/2017   OSA on CPAP 05/05/2017   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HCC) 01/05/2017   Abnormal chest x-ray 10/12/2016   Benign neoplasm of colon 01/14/2013   Gastro-esophageal reflux disease without esophagitis 11/24/2009   Past Medical History:  Diagnosis Date   Acute blood loss anemia    Anxiety    AV block, Mobitz 1    Cataract    Chronic kidney disease  d/t DM   CKD (chronic kidney disease), stage III (HCC)    COVID-19 virus infection 07/18/2020   Last Assessment & Plan:   Formatting of this note might be different from the original.  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 10/2019 Echo: EF 55-60%, Gr2 DD. Nl RV.    Neuromuscular disorder (HCC)    Neuropathy associated with endocrine disorder (HCC)    Nonobstructive CAD (coronary artery disease)    a. 2012 Cath: mod, nonobs dzs; b. 10/2016 MV: EF 60%, no ischemia.   OSA on CPAP 05/05/2017   Unattended Home Sleep Test 7/2/813-AHI 38.6/hour, desaturation to 64%, body weight 261 pounds   PONV (postoperative nausea and vomiting)    Postoperative anemia due to acute blood loss 11/07/2020   Postoperative hemorrhagic  shock 03/20/2021   Sleep apnea     uses cpap asked to bring mask and tubing    Family History  Problem Relation Age of Onset   Diabetes Mellitus II Mother    Emphysema Father 44   Heart attack Father    Colon cancer Neg Hx    Esophageal cancer Neg Hx    Rectal cancer Neg Hx    Stomach cancer Neg Hx     Past Surgical History:  Procedure Laterality Date   ABDOMINAL AORTOGRAM W/LOWER EXTREMITY N/A 12/10/2020   Procedure: ABDOMINAL AORTOGRAM W/LOWER EXTREMITY;  Surgeon: Magda Debby SAILOR, MD;  Location: [facility] INV[facility] LAB;  Service: Cardiovascular;  Laterality: N/A;   AMPUTATION Right 01/22/2021   Procedure: 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;  Surgeon: Harden Jerona GAILS, MD;  Location: [facility] OR;  Service: Orthopedics;  Laterality: Right;   AMPUTATION Right 03/17/2021   Procedure: RIGHT BELOW KNEE AMPUTATION;  Surgeon: Harden Jerona GAILS, MD;  Location: [facility] OR;  Service: Orthopedics;  Laterality: Right;   ANKLE FUSION Right 01/22/2021   Procedure: RIGHT FOOT TIBIOCALCANEAL FUSION;  Surgeon: Harden Jerona GAILS, MD;  Location: [facility] OR;  Service: Orthopedics;  Laterality: Right;   ANTERIOR FUSION CERVICAL SPINE  2012   CARDIAC CATHETERIZATION  2011   CARDIAC CATHETERIZATION N/A 11/09/2016   Procedure: Right Heart Cath;  Surgeon: Victory LELON Sharps, MD;  Location: [facility] INVASIVE CV LAB;  Service: Cardiovascular;  Laterality: N/A;   carpel tunnel     left wrist   CATARACT EXTRACTION     CATARACT EXTRACTION W/ INTRAOCULAR LENS  IMPLANT, BILATERAL  2013   CERVICAL LAMINECTOMY  2012   COLONOSCOPY N/A 01/14/2013   Procedure: COLONOSCOPY;  Surgeon: Norleen SAILOR Kiang, MD;  Location: [facility];  Service: Endoscopy;  Laterality: N/A;   CORONARY ARTERY BYPASS GRAFT  11/04/2020   LIMA-LAD, SVG-OM1, SVG-PDA (Dr Norleen Exon [facility]) dc 11/18/2020   EYE SURGERY     I & D EXTREMITY Right 02/19/2021   Procedure: RIGHT ANKLE DEBRIDEMENT AND PLACEMENT ANTIBIOTIC BEADS;  Surgeon: Harden Jerona GAILS, MD;  Location: [facility] OR;  Service: Orthopedics;  Laterality:  Right;   KNEE SURGERY  1998   left   LEFT HEART CATH AND CORONARY ANGIOGRAPHY N/A 07/10/2020   Procedure: LEFT HEART CATH AND CORONARY ANGIOGRAPHY;  Surgeon: Wonda Sharper, MD;  Location: [facility] INVASIVE CV LAB;  Service: Cardiovascular;  Laterality: N/A;   LUMBAR LAMINECTOMY  2003   LUNG BIOPSY Left 12/26/2016   Procedure: LUNG BIOPSY;  Surgeon: Elspeth JAYSON Millers, MD;  Location: [facility] OR;  Service: Thoracic;  Laterality: Left;   PACEMAKER IMPLANT N/A 03/30/2020   Procedure: PACEMAKER IMPLANT;  Surgeon: Waddell Danelle ORN, MD;  Location: [facility] LAB;  Service: Cardiovascular;  Laterality: N/A;   PERIPHERAL VASCULAR INTERVENTION Right 12/10/2020   Procedure: PERIPHERAL VASCULAR INTERVENTION;  Surgeon: Magda Debby SAILOR, MD;  Location: [facility];  Service: Cardiovascular;  Laterality: Right;  SFA   POSTERIOR FUSION CERVICAL SPINE  2012   PPM GENERATOR REMOVAL N/A 12/14/2020   Procedure: PPM GENERATOR REMOVAL;  Surgeon: Waddell Danelle ORN, MD;  [facility]: [facility];  Service: Cardiovascular;  Laterality: N/A;   TEE WITHOUT CARDIOVERSION N/A 12/11/2020   Procedure: TRANSESOPHAGEAL ECHOCARDIOGRAM (TEE);  Surgeon: Barbaraann Darryle Debby, MD;  Location: [facility] ENDOSCOPY;  Service: Cardiovascular;  Laterality: N/A;   TRIGGER FINGER RELEASE  2011   4th finger left hand   VIDEO ASSISTED THORACOSCOPY Left 12/26/2016   Procedure: VIDEO ASSISTED THORACOSCOPY;  Surgeon: Elspeth JAYSON Millers, MD;  Location: [facility] OR;  Service: Thoracic;  Laterality: Left;   VIDEO BRONCHOSCOPY N/A 12/26/2016   Procedure: VIDEO BRONCHOSCOPY;  Surgeon: Elspeth JAYSON Millers, MD;  Location: [facility] OR;  Service: Thoracic;  Laterality: N/A;   Social History   Occupational History   Not on file  Tobacco Use   Smoking status: Never    Passive exposure: Past   Smokeless tobacco: Never  Vaping Use   Vaping status: Never Used  Substance and Sexual Activity   Alcohol use: No   Drug use: No   Sexual activity: Not Currently

## 2024-06-21 NOTE — Telephone Encounter (Signed)
 April from [place] called. Would like the wound care orders for patient. Her cb# [phone]

## 2024-06-21 NOTE — Telephone Encounter (Signed)
 SW April, advised to to a vashe wet to dry and wrap with ACE for compression each day.

## 2024-07-04 ENCOUNTER — Ambulatory Visit (INDEPENDENT_AMBULATORY_CARE_PROVIDER_SITE_OTHER): Admitting: Orthopedic Surgery

## 2024-07-04 DIAGNOSIS — Z89511 Acquired absence of right leg below knee: Secondary | ICD-10-CM

## 2024-07-04 DIAGNOSIS — L97521 Non-pressure chronic ulcer of other part of left foot limited to breakdown of skin: Secondary | ICD-10-CM | POA: Diagnosis not present

## 2024-07-04 DIAGNOSIS — I739 Peripheral vascular disease, unspecified: Secondary | ICD-10-CM

## 2024-07-09 ENCOUNTER — Encounter: Payer: Self-pay | Admitting: Orthopedic Surgery

## 2024-07-09 NOTE — Progress Notes (Signed)
 Office Visit Note   Patient: Waylon Hershey           Date of Birth: 1941-08-21           MRN: [id number] Visit Date: 07/04/2024              Requested by: Shepard Ade, MD [address] [place],  [place] [address] PCP: Shepard Ade, MD  No chief complaint on file.     HPI: Patient is an 83 year old gentleman who is status post left transmetatarsal amputation with ulceration.  Assessment & Plan: Visit Diagnoses: No diagnosis found.  Plan: Wound was debrided there is healthy granulation tissue we will continue with Vashe dressing changes daily.  Follow-Up Instructions: Return in about 4 weeks (around 08/01/2024).   Ortho Exam  Patient is alert, oriented, no adenopathy, well-dressed, normal affect, normal respiratory effort. Examination there is a flat healthy granulating wound over the residual limb which measures 1.5 x 1.5 cm.  There is an ankle ulcer with healthy granulation tissue that is 1 x 0.5 cm with flat granulation tissue.    Imaging: No results found. No images are attached to the encounter.  Labs: Lab Results  Component Value Date   HGBA1C 8.7 (H) 04/09/2024   HGBA1C 8.5 (H) 12/06/2023   HGBA1C 9.0 (H) 06/24/2023   ESRSEDRATE 84 (H) 12/29/2023   ESRSEDRATE 22 (H) 10/14/2016   CRP 3.5 (H) 12/29/2023   CRP 9.0 (H) 12/08/2023   CRP 8.3 (H) 12/09/2020   REPTSTATUS 04/15/2024 FINAL 04/09/2024   GRAMSTAIN  02/19/2021    RARE WBC PRESENT, PREDOMINANTLY PMN NO ORGANISMS SEEN    CULT  04/09/2024    NO GROWTH 5 DAYS Performed at [facility], [address] [place], [place] [address]    [facility] ESCHERICHIA COLI 02/19/2021     Lab Results  Component Value Date   ALBUMIN  2.9 (L) 04/11/2024   ALBUMIN  2.9 (L) 04/10/2024   ALBUMIN  3.2 (L) 04/09/2024    Lab Results  Component Value Date   MG 1.6 (L) 04/09/2024   MG 1.8 12/30/2023   MG 1.9 12/29/2023   Lab Results  Component Value Date   VD25OH 32.29 12/14/2022    No  results found for: PREALBUMIN    Latest Ref Rng & Units 04/13/2024    5:05 AM 04/12/2024    4:07 AM 04/11/2024    2:20 AM  CBC EXTENDED  WBC 4.0 - 10.5 K/uL 4.7  5.7  5.2   RBC 4.22 - 5.81 MIL/uL 3.70  3.84  3.46   Hemoglobin 13.0 - 17.0 g/dL 88.8  88.4  89.4   HCT 39.0 - 52.0 % 33.5  34.7  31.3   Platelets 150 - 400 K/uL 133  137  135      There is no height or weight on file to calculate BMI.  Orders:  No orders of the defined types were placed in this encounter.  No orders of the defined types were placed in this encounter.    Procedures: No procedures performed  Clinical Data: No additional findings.  ROS:  All other systems negative, except as noted in the HPI. Review of Systems  Objective: Vital Signs: There were no vitals taken for this visit.  Specialty Comments:  No specialty comments available.  PMFS History: Patient Active Problem List   Diagnosis Date Noted   Acute kidney injury superimposed on stage 3a chronic kidney disease (HCC) 04/09/2024   Impaired functional mobility, balance, gait, and endurance  04/09/2024   Non-pressure chronic ulcer of other part of left foot limited to breakdown of skin (HCC) 12/27/2023   Type 2 diabetes mellitus without complication, with long-term current use of insulin  (HCC) 12/12/2023   Esophageal dysphagia 12/11/2023   COVID-19 12/11/2023   Acute on chronic hypoxic respiratory failure (HCC) 12/10/2023   Anemia 12/10/2023   Non-ST elevation (NSTEMI) myocardial infarction (HCC) 12/09/2023   Retroperitoneal bleeding 12/09/2023   History of transcatheter aortic valve replacement (TAVR) 12/09/2023   Chronic heart failure with preserved ejection fraction (HFpEF) (HCC) 12/09/2023   Hx of CABG 12/09/2023   Atherosclerosis of native coronary artery of native heart without angina pectoris 12/09/2023   Hyperkalemia 12/07/2023   Acute on chronic congestive heart failure (HCC) 12/07/2023   Acute kidney injury (nontraumatic) (HCC)  12/06/2023   Acute on chronic combined systolic and diastolic CHF, NYHA class 1 (HCC) 06/22/2023   Acute on chronic systolic (congestive) heart failure (HCC) 06/21/2023   Intermediate stage nonexudative age-related macular degeneration of both eyes 04/25/2022   Research study patient 12/24/2021   Labile blood glucose    Pulmonary fibrosis (HCC)    Hyponatremia    S/P BKA (below knee amputation) unilateral, right (HCC)    Shortness of breath    Atrial fibrillation (HCC)    Postoperative pain    Atherosclerosis of native arteries of extremities with gangrene, right leg (HCC)    Hardware complicating wound infection (HCC)    Abscess of bursa of right ankle 02/15/2021   Endocarditis 01/19/2021   PAD (peripheral artery disease) (HCC) 12/29/2020   Chronic systolic CHF (congestive heart failure) (HCC) 12/22/2020   History of COVID-19 12/22/2020   SSS (sick sinus syndrome) (HCC) 12/22/2020   Acute bacterial endocarditis    MSSA bacteremia 12/09/2020   Diabetic foot ulcer (HCC) 12/09/2020   Foot drop, right foot    Generalized weakness 12/08/2020   Atrial fibrillation, chronic (HCC) 12/08/2020   Elevated troponin level not due myocardial infarction 12/08/2020   Adrenal insufficiency (HCC) 11/14/2020   Orthostatic hypotension 11/14/2020   Physical deconditioning 11/14/2020   Difficulty sleeping 11/07/2020   Right ventricular dysfunction 11/05/2020   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/04/2020   Hearing loss 11/03/2020   Cardiac device in situ 09/09/2020   COVID-19 virus infection 07/18/2020   Coronary artery disease involving native heart without angina pectoris/ S/P CABG x3 07/10/2020   Chronic kidney disease, stage 3a (HCC) 03/29/2020   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/28/2020   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) 03/16/2020   Right epiretinal membrane 03/16/2020   Posterior vitreous detachment of right eye 03/16/2020   Aortic stenosis, moderate  03/12/2020   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   Near syncope 04/27/2018   Hypoglycemia due to insulin  01/06/2018   Essential hypertension 01/06/2018   Syncope and collapse 01/05/2018   Encounter for therapeutic drug monitoring 06/29/2017   OSA on CPAP 05/05/2017   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HCC) 01/05/2017   Abnormal chest x-ray 10/12/2016   Benign neoplasm of colon 01/14/2013   Gastro-esophageal reflux disease without esophagitis 11/24/2009   Past Medical History:  Diagnosis Date   Acute blood loss anemia    Anxiety    AV block, Mobitz 1    Cataract    Chronic kidney disease    d/t DM   CKD (chronic kidney disease), stage III (HCC)    COVID-19 virus infection 07/18/2020   Last Assessment & Plan:   Formatting  of this note might be different from the original.  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 10/2019 Echo: EF 55-60%, Gr2 DD. Nl RV.    Neuromuscular disorder (HCC)    Neuropathy associated with endocrine disorder (HCC)    Nonobstructive CAD (coronary artery disease)    a. 2012 Cath: mod, nonobs dzs; b. 10/2016 MV: EF 60%, no ischemia.   OSA on CPAP 05/05/2017   Unattended Home Sleep Test 7/2/813-AHI 38.6/hour, desaturation to 64%, body weight 261 pounds   PONV (postoperative nausea and vomiting)    Postoperative anemia due to acute blood loss 11/07/2020   Postoperative hemorrhagic shock 03/20/2021   Sleep apnea     uses cpap asked to bring mask and tubing    Family History  Problem  Relation Age of Onset   Diabetes Mellitus II Mother    Emphysema Father 47   Heart attack Father    Colon cancer Neg Hx    Esophageal cancer Neg Hx    Rectal cancer Neg Hx    Stomach cancer Neg Hx     Past Surgical History:  Procedure Laterality Date   ABDOMINAL AORTOGRAM W/LOWER EXTREMITY N/A 12/10/2020   Procedure: ABDOMINAL AORTOGRAM W/LOWER EXTREMITY;  Surgeon: Magda Debby SAILOR, MD;  Location: [facility] INVASIVE CV LAB;  Service: Cardiovascular;  Laterality: N/A;   AMPUTATION Right 01/22/2021   Procedure: 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;  Surgeon: Harden Jerona GAILS, MD;  Location: [facility] OR;  Service: Orthopedics;  Laterality: Right;   AMPUTATION Right 03/17/2021   Procedure: RIGHT BELOW KNEE AMPUTATION;  Surgeon: Harden Jerona GAILS, MD;  Location: [facility] OR;  Service: Orthopedics;  Laterality: Right;   ANKLE FUSION Right 01/22/2021   Procedure: RIGHT FOOT TIBIOCALCANEAL FUSION;  Surgeon: Harden Jerona GAILS, MD;  Location: [facility] OR;  Service: Orthopedics;  Laterality: Right;   ANTERIOR FUSION CERVICAL SPINE  2012   CARDIAC CATHETERIZATION  2011   CARDIAC CATHETERIZATION N/A 11/09/2016   Procedure: Right Heart Cath;  Surgeon: Victory LELON Sharps, MD;  Location: [facility] INVASIVE CV LAB;  Service: Cardiovascular;  Laterality: N/A;   carpel tunnel     left wrist   CATARACT EXTRACTION     CATARACT EXTRACTION W/ INTRAOCULAR LENS  IMPLANT, BILATERAL  2013   CERVICAL LAMINECTOMY  2012   COLONOSCOPY N/A 01/14/2013   Procedure: COLONOSCOPY;  Surgeon: Norleen SAILOR Kiang, MD;  Location: [facility] ENDOSCOPY;  Service: Endoscopy;  Laterality: N/A;   CORONARY ARTERY BYPASS GRAFT  11/04/2020   LIMA-LAD, SVG-OM1, SVG-PDA (Dr Norleen Exon [facility]) dc 11/18/2020   EYE SURGERY     I & D EXTREMITY Right 02/19/2021   Procedure: RIGHT ANKLE DEBRIDEMENT AND PLACEMENT ANTIBIOTIC BEADS;  Surgeon: Harden Jerona GAILS, MD;  Location: [facility] OR;  Service: Orthopedics;  Laterality: Right;   KNEE SURGERY  1998   left   LEFT HEART CATH AND CORONARY ANGIOGRAPHY N/A 07/10/2020   Procedure:  LEFT HEART CATH AND CORONARY ANGIOGRAPHY;  Surgeon: Wonda Sharper,  MD;  Location: [facility] INVASIVE CV LAB;  Service: Cardiovascular;  Laterality: N/A;   LUMBAR LAMINECTOMY  2003   LUNG BIOPSY Left 12/26/2016   Procedure: LUNG BIOPSY;  Surgeon: Elspeth JAYSON Millers, MD;  Location: [facility] OR;  Service: Thoracic;  Laterality: Left;   PACEMAKER IMPLANT N/A 03/30/2020   Procedure: PACEMAKER IMPLANT;  Surgeon: Waddell Danelle ORN, MD;  Location: [facility] INVAS[facility] CV LAB;  Service: Cardiovascular;  Laterality: N/A;   PERIPHERAL VASCULAR INTERVENTION Right 12/10/2020   Procedure: PERIPHERAL VASCULAR INTERVENTION;  Surgeon: Magda Debby SAILOR, MD;  [facility]: [facility] LAB;  Service: Cardiovascular;  Laterality: Right;  SFA   POSTERIOR FUSION CERVICAL SPINE  2012   PPM GENERATOR REMOVAL N/A 12/14/2020   Procedure: PPM GENERATOR REMOVAL;  Surgeon: Waddell Danelle ORN, MD;  Location: [facility] INVASIVE CV LAB;  Service: Cardiovascular;  Laterality: N/A;   TEE WITHOUT CARDIOVERSION N/A 12/11/2020   Procedure: TRANSESOPHAGEAL ECHOCARDIOGRAM (TEE);  Surgeon: Barbaraann Darryle Debby, MD;  Location: [facility] ENDOSCOPY;  Service: Cardiovascular;  Laterality: N/A;   TRIGGER FINGER RELEASE  2011   4th finger left hand   VIDEO ASSISTED THORACOSCOPY Left 12/26/2016   Procedure: VIDEO ASSISTED THORACOSCOPY;  Surgeon: Elspeth JAYSON Millers, MD;  Location: [facility] OR;  Service: Thoracic;  Laterality: Left;   VIDEO BRONCHOSCOPY N/A 12/26/2016   Procedure: VIDEO BRONCHOSCOPY;  Surgeon: Elspeth JAYSON Millers, MD;  Location: [facility] OR;  Service: Thoracic;  Laterality: N/A;   Social History   Occupational History   Not on file  Tobacco Use   Smoking status: Never    Passive exposure: Past   Smokeless tobacco: Never  Vaping Use   Vaping status: Never Used  Substance and Sexual Activity   Alcohol use: No   Drug use: No   Sexual activity: Not Currently

## 2024-07-23 ENCOUNTER — Encounter (HOSPITAL_COMMUNITY): Payer: Self-pay

## 2024-07-23 ENCOUNTER — Emergency Department (HOSPITAL_COMMUNITY)

## 2024-07-23 ENCOUNTER — Inpatient Hospital Stay (HOSPITAL_COMMUNITY)
Admission: EM | Admit: 2024-07-23 | Discharge: 2024-07-26 | DRG: 915 | Disposition: A | Discharge status: Home Health | Attending: Internal Medicine | Admitting: Internal Medicine

## 2024-07-23 DIAGNOSIS — J84112 Idiopathic pulmonary fibrosis: Secondary | ICD-10-CM | POA: Diagnosis present

## 2024-07-23 DIAGNOSIS — T782XXA Anaphylactic shock, unspecified, initial encounter: Principal | ICD-10-CM | POA: Diagnosis present

## 2024-07-23 DIAGNOSIS — I130 Hypertensive heart and chronic kidney disease with heart failure and stage 1 through stage 4 chronic kidney disease, or unspecified chronic kidney disease: Secondary | ICD-10-CM | POA: Diagnosis present

## 2024-07-23 DIAGNOSIS — R58 Hemorrhage, not elsewhere classified: Secondary | ICD-10-CM | POA: Diagnosis not present

## 2024-07-23 DIAGNOSIS — I2510 Atherosclerotic heart disease of native coronary artery without angina pectoris: Secondary | ICD-10-CM | POA: Diagnosis not present

## 2024-07-23 DIAGNOSIS — I4891 Unspecified atrial fibrillation: Secondary | ICD-10-CM | POA: Diagnosis present

## 2024-07-23 DIAGNOSIS — J449 Chronic obstructive pulmonary disease, unspecified: Secondary | ICD-10-CM | POA: Diagnosis present

## 2024-07-23 DIAGNOSIS — Z9841 Cataract extraction status, right eye: Secondary | ICD-10-CM

## 2024-07-23 DIAGNOSIS — K56609 Unspecified intestinal obstruction, unspecified as to partial versus complete obstruction: Secondary | ICD-10-CM | POA: Diagnosis present

## 2024-07-23 DIAGNOSIS — Z79899 Other long term (current) drug therapy: Secondary | ICD-10-CM

## 2024-07-23 DIAGNOSIS — Z86711 Personal history of pulmonary embolism: Secondary | ICD-10-CM

## 2024-07-23 DIAGNOSIS — Z89511 Acquired absence of right leg below knee: Secondary | ICD-10-CM | POA: Diagnosis not present

## 2024-07-23 DIAGNOSIS — I5020 Unspecified systolic (congestive) heart failure: Secondary | ICD-10-CM | POA: Diagnosis not present

## 2024-07-23 DIAGNOSIS — Z952 Presence of prosthetic heart valve: Secondary | ICD-10-CM

## 2024-07-23 DIAGNOSIS — Z950 Presence of cardiac pacemaker: Secondary | ICD-10-CM

## 2024-07-23 DIAGNOSIS — I482 Chronic atrial fibrillation, unspecified: Secondary | ICD-10-CM | POA: Diagnosis present

## 2024-07-23 DIAGNOSIS — I495 Sick sinus syndrome: Secondary | ICD-10-CM | POA: Diagnosis present

## 2024-07-23 DIAGNOSIS — Z833 Family history of diabetes mellitus: Secondary | ICD-10-CM

## 2024-07-23 DIAGNOSIS — Z794 Long term (current) use of insulin: Secondary | ICD-10-CM

## 2024-07-23 DIAGNOSIS — R112 Nausea with vomiting, unspecified: Secondary | ICD-10-CM | POA: Diagnosis not present

## 2024-07-23 DIAGNOSIS — R55 Syncope and collapse: Secondary | ICD-10-CM | POA: Diagnosis present

## 2024-07-23 DIAGNOSIS — R7989 Other specified abnormal findings of blood chemistry: Secondary | ICD-10-CM | POA: Diagnosis not present

## 2024-07-23 DIAGNOSIS — Z8249 Family history of ischemic heart disease and other diseases of the circulatory system: Secondary | ICD-10-CM

## 2024-07-23 DIAGNOSIS — I480 Paroxysmal atrial fibrillation: Secondary | ICD-10-CM | POA: Diagnosis present

## 2024-07-23 DIAGNOSIS — E1122 Type 2 diabetes mellitus with diabetic chronic kidney disease: Secondary | ICD-10-CM | POA: Diagnosis present

## 2024-07-23 DIAGNOSIS — E1140 Type 2 diabetes mellitus with diabetic neuropathy, unspecified: Secondary | ICD-10-CM | POA: Diagnosis present

## 2024-07-23 DIAGNOSIS — Z7982 Long term (current) use of aspirin: Secondary | ICD-10-CM

## 2024-07-23 DIAGNOSIS — X58XXXA Exposure to other specified factors, initial encounter: Secondary | ICD-10-CM | POA: Diagnosis present

## 2024-07-23 DIAGNOSIS — K56600 Partial intestinal obstruction, unspecified as to cause: Secondary | ICD-10-CM | POA: Diagnosis present

## 2024-07-23 DIAGNOSIS — Z6831 Body mass index (BMI) 31.0-31.9, adult: Secondary | ICD-10-CM

## 2024-07-23 DIAGNOSIS — Z825 Family history of asthma and other chronic lower respiratory diseases: Secondary | ICD-10-CM

## 2024-07-23 DIAGNOSIS — R579 Shock, unspecified: Secondary | ICD-10-CM | POA: Diagnosis present

## 2024-07-23 DIAGNOSIS — D631 Anemia in chronic kidney disease: Secondary | ICD-10-CM | POA: Diagnosis present

## 2024-07-23 DIAGNOSIS — E872 Acidosis, unspecified: Secondary | ICD-10-CM | POA: Diagnosis present

## 2024-07-23 DIAGNOSIS — E871 Hypo-osmolality and hyponatremia: Secondary | ICD-10-CM | POA: Diagnosis present

## 2024-07-23 DIAGNOSIS — N1832 Chronic kidney disease, stage 3b: Secondary | ICD-10-CM | POA: Diagnosis present

## 2024-07-23 DIAGNOSIS — I959 Hypotension, unspecified: Secondary | ICD-10-CM | POA: Diagnosis not present

## 2024-07-23 DIAGNOSIS — J849 Interstitial pulmonary disease, unspecified: Secondary | ICD-10-CM | POA: Diagnosis present

## 2024-07-23 DIAGNOSIS — I21A1 Myocardial infarction type 2: Secondary | ICD-10-CM | POA: Diagnosis present

## 2024-07-23 DIAGNOSIS — E875 Hyperkalemia: Secondary | ICD-10-CM | POA: Diagnosis present

## 2024-07-23 DIAGNOSIS — Z880 Allergy status to penicillin: Secondary | ICD-10-CM

## 2024-07-23 DIAGNOSIS — Z981 Arthrodesis status: Secondary | ICD-10-CM

## 2024-07-23 DIAGNOSIS — I4510 Unspecified right bundle-branch block: Secondary | ICD-10-CM | POA: Diagnosis not present

## 2024-07-23 DIAGNOSIS — G4733 Obstructive sleep apnea (adult) (pediatric): Secondary | ICD-10-CM | POA: Diagnosis not present

## 2024-07-23 DIAGNOSIS — Z9981 Dependence on supplemental oxygen: Secondary | ICD-10-CM

## 2024-07-23 DIAGNOSIS — I214 Non-ST elevation (NSTEMI) myocardial infarction: Secondary | ICD-10-CM

## 2024-07-23 DIAGNOSIS — Z87442 Personal history of urinary calculi: Secondary | ICD-10-CM

## 2024-07-23 DIAGNOSIS — L97529 Non-pressure chronic ulcer of other part of left foot with unspecified severity: Secondary | ICD-10-CM | POA: Diagnosis present

## 2024-07-23 DIAGNOSIS — Z8616 Personal history of COVID-19: Secondary | ICD-10-CM | POA: Diagnosis not present

## 2024-07-23 DIAGNOSIS — F39 Unspecified mood [affective] disorder: Secondary | ICD-10-CM | POA: Diagnosis present

## 2024-07-23 DIAGNOSIS — I5022 Chronic systolic (congestive) heart failure: Secondary | ICD-10-CM | POA: Diagnosis present

## 2024-07-23 DIAGNOSIS — E119 Type 2 diabetes mellitus without complications: Secondary | ICD-10-CM | POA: Diagnosis not present

## 2024-07-23 DIAGNOSIS — I519 Heart disease, unspecified: Secondary | ICD-10-CM | POA: Diagnosis present

## 2024-07-23 DIAGNOSIS — E66811 Obesity, class 1: Secondary | ICD-10-CM | POA: Diagnosis present

## 2024-07-23 DIAGNOSIS — D72829 Elevated white blood cell count, unspecified: Secondary | ICD-10-CM | POA: Diagnosis present

## 2024-07-23 DIAGNOSIS — I5021 Acute systolic (congestive) heart failure: Secondary | ICD-10-CM | POA: Diagnosis not present

## 2024-07-23 DIAGNOSIS — Z951 Presence of aortocoronary bypass graft: Secondary | ICD-10-CM | POA: Diagnosis not present

## 2024-07-23 DIAGNOSIS — E785 Hyperlipidemia, unspecified: Secondary | ICD-10-CM | POA: Diagnosis present

## 2024-07-23 DIAGNOSIS — Z9842 Cataract extraction status, left eye: Secondary | ICD-10-CM

## 2024-07-23 DIAGNOSIS — Z7901 Long term (current) use of anticoagulants: Secondary | ICD-10-CM

## 2024-07-23 DIAGNOSIS — Z91199 Patient's noncompliance with other medical treatment and regimen due to unspecified reason: Secondary | ICD-10-CM

## 2024-07-23 DIAGNOSIS — E11621 Type 2 diabetes mellitus with foot ulcer: Secondary | ICD-10-CM | POA: Diagnosis present

## 2024-07-23 DIAGNOSIS — I5082 Biventricular heart failure: Secondary | ICD-10-CM | POA: Diagnosis not present

## 2024-07-23 DIAGNOSIS — Z885 Allergy status to narcotic agent status: Secondary | ICD-10-CM

## 2024-07-23 DIAGNOSIS — Z961 Presence of intraocular lens: Secondary | ICD-10-CM | POA: Diagnosis present

## 2024-07-23 DIAGNOSIS — N183 Chronic kidney disease, stage 3 unspecified: Secondary | ICD-10-CM | POA: Diagnosis present

## 2024-07-23 DIAGNOSIS — H9190 Unspecified hearing loss, unspecified ear: Secondary | ICD-10-CM | POA: Diagnosis present

## 2024-07-23 LAB — I-STAT CHEM 8, ED
BUN: 76 mg/dL — ABNORMAL HIGH (ref 8–23)
Calcium, Ion: 1 mmol/L — ABNORMAL LOW (ref 1.15–1.40)
Chloride: 101 mmol/L (ref 98–111)
Creatinine, Ser: 1.7 mg/dL — ABNORMAL HIGH (ref 0.61–1.24)
Glucose, Bld: 153 mg/dL — ABNORMAL HIGH (ref 70–99)
HCT: 46 % (ref 39.0–52.0)
Hemoglobin: 15.6 g/dL (ref 13.0–17.0)
Potassium: 4.7 mmol/L (ref 3.5–5.1)
Sodium: 136 mmol/L (ref 135–145)
TCO2: 28 mmol/L (ref 22–32)

## 2024-07-23 LAB — CBC WITH DIFFERENTIAL/PLATELET
Abs Immature Granulocytes: 0.08 K/uL — ABNORMAL HIGH (ref 0.00–0.07)
Basophils Absolute: 0 K/uL (ref 0.0–0.1)
Basophils Relative: 0 %
Eosinophils Absolute: 0.1 K/uL (ref 0.0–0.5)
Eosinophils Relative: 1 %
HCT: 44.8 % (ref 39.0–52.0)
Hemoglobin: 14.7 g/dL (ref 13.0–17.0)
Immature Granulocytes: 1 %
Lymphocytes Relative: 12 %
Lymphs Abs: 1.8 K/uL (ref 0.7–4.0)
MCH: 30.6 pg (ref 26.0–34.0)
MCHC: 32.8 g/dL (ref 30.0–36.0)
MCV: 93.3 fL (ref 80.0–100.0)
Monocytes Absolute: 0.7 K/uL (ref 0.1–1.0)
Monocytes Relative: 5 %
Neutro Abs: 12.4 K/uL — ABNORMAL HIGH (ref 1.7–7.7)
Neutrophils Relative %: 81 %
Platelets: 245 K/uL (ref 150–400)
RBC: 4.8 MIL/uL (ref 4.22–5.81)
RDW: 13.1 % (ref 11.5–15.5)
WBC: 15.1 K/uL — ABNORMAL HIGH (ref 4.0–10.5)
nRBC: 0 % (ref 0.0–0.2)

## 2024-07-23 LAB — RESP PANEL BY RT-PCR (RSV, FLU A&B, COVID)  RVPGX2
Influenza A by PCR: NEGATIVE
Influenza B by PCR: NEGATIVE
Resp Syncytial Virus by PCR: NEGATIVE
SARS Coronavirus 2 by RT PCR: NEGATIVE

## 2024-07-23 LAB — COMPREHENSIVE METABOLIC PANEL WITH GFR
ALT: 11 U/L (ref 0–44)
AST: 39 U/L (ref 15–41)
Albumin: 3.1 g/dL — ABNORMAL LOW (ref 3.5–5.0)
Alkaline Phosphatase: 98 U/L (ref 38–126)
Anion gap: 16 — ABNORMAL HIGH (ref 5–15)
BUN: 53 mg/dL — ABNORMAL HIGH (ref 8–23)
CO2: 23 mmol/L (ref 22–32)
Calcium: 8.1 mg/dL — ABNORMAL LOW (ref 8.9–10.3)
Chloride: 95 mmol/L — ABNORMAL LOW (ref 98–111)
Creatinine, Ser: 1.67 mg/dL — ABNORMAL HIGH (ref 0.61–1.24)
GFR, Estimated: 40 mL/min — ABNORMAL LOW (ref >60)
Glucose, Bld: 158 mg/dL — ABNORMAL HIGH (ref 70–99)
Potassium: 4.9 mmol/L (ref 3.5–5.1)
Sodium: 134 mmol/L — ABNORMAL LOW (ref 135–145)
Total Bilirubin: 1.7 mg/dL — ABNORMAL HIGH (ref 0.0–1.2)
Total Protein: 6.9 g/dL (ref 6.5–8.1)

## 2024-07-23 LAB — TROPONIN I (HIGH SENSITIVITY)
Troponin I (High Sensitivity): 18 ng/L — ABNORMAL HIGH (ref <18)
Troponin I (High Sensitivity): 93 ng/L — ABNORMAL HIGH (ref <18)

## 2024-07-23 LAB — I-STAT CG4 LACTIC ACID, ED: Lactic Acid, Venous: 4.4 mmol/L (ref 0.5–1.9)

## 2024-07-23 LAB — I-STAT VENOUS BLOOD GAS, ED
Acid-Base Excess: 1 mmol/L (ref 0.0–2.0)
Bicarbonate: 28 mmol/L (ref 20.0–28.0)
Calcium, Ion: 1.02 mmol/L — ABNORMAL LOW (ref 1.15–1.40)
HCT: 44 % (ref 39.0–52.0)
Hemoglobin: 15 g/dL (ref 13.0–17.0)
O2 Saturation: 46 %
Potassium: 4.8 mmol/L (ref 3.5–5.1)
Sodium: 137 mmol/L (ref 135–145)
TCO2: 30 mmol/L (ref 22–32)
pCO2, Ven: 54.6 mmHg (ref 44–60)
pH, Ven: 7.318 (ref 7.25–7.43)
pO2, Ven: 28 mmHg — CL (ref 32–45)

## 2024-07-23 LAB — CBG MONITORING, ED: Glucose-Capillary: 160 mg/dL — ABNORMAL HIGH (ref 70–99)

## 2024-07-23 LAB — LACTIC ACID, PLASMA
Lactic Acid, Venous: 4.1 mmol/L (ref 0.5–1.9)
Lactic Acid, Venous: 7.4 mmol/L (ref 0.5–1.9)

## 2024-07-23 LAB — LIPASE, BLOOD: Lipase: 33 U/L (ref 11–51)

## 2024-07-23 MED ORDER — NOREPINEPHRINE 4 MG/250ML-% IV SOLN: 0.0000 ug/min | INTRAVENOUS | Status: DC

## 2024-07-23 MED ORDER — METRONIDAZOLE 500 MG/100ML IV SOLN
500.0000 mg | Freq: Once | INTRAVENOUS | Status: AC
  Administered 2024-07-23: 500 mg via INTRAVENOUS
  Filled 2024-07-23: qty 100

## 2024-07-23 MED ORDER — LACTATED RINGERS IV SOLN: INTRAVENOUS | Status: DC

## 2024-07-23 MED ORDER — SODIUM CHLORIDE 0.9 % IV SOLN
2.0000 g | Freq: Once | INTRAVENOUS | Status: AC
  Administered 2024-07-23: 2 g via INTRAVENOUS
  Filled 2024-07-23: qty 12.5

## 2024-07-23 MED ORDER — HYDROMORPHONE HCL 1 MG/ML IJ SOLN
0.5000 mg | Freq: Once | INTRAMUSCULAR | Status: AC
  Administered 2024-07-23: 0.5 mg via INTRAVENOUS
  Filled 2024-07-23: qty 1

## 2024-07-23 MED ORDER — IOHEXOL 350 MG/ML SOLN
75.0000 mL | Freq: Once | INTRAVENOUS | Status: AC | PRN
  Administered 2024-07-23: 75 mL via INTRAVENOUS

## 2024-07-23 MED ORDER — METHYLPREDNISOLONE SODIUM SUCC 125 MG IJ SOLR
125.0000 mg | INTRAMUSCULAR | Status: AC
  Administered 2024-07-23: 125 mg via INTRAVENOUS
  Filled 2024-07-23: qty 2

## 2024-07-23 MED ORDER — VANCOMYCIN HCL IN DEXTROSE 1-5 GM/200ML-% IV SOLN: 1000.0000 mg | Freq: Once | INTRAVENOUS | Status: DC

## 2024-07-23 MED ORDER — DIPHENHYDRAMINE HCL 50 MG/ML IJ SOLN
12.5000 mg | Freq: Once | INTRAMUSCULAR | Status: AC
  Administered 2024-07-23: 12.5 mg via INTRAVENOUS
  Filled 2024-07-23: qty 1

## 2024-07-23 MED ORDER — ONDANSETRON HCL 4 MG/2ML IJ SOLN
4.0000 mg | Freq: Once | INTRAMUSCULAR | Status: AC
  Administered 2024-07-23: 4 mg via INTRAVENOUS
  Filled 2024-07-23: qty 2

## 2024-07-23 MED ORDER — DIPHENHYDRAMINE HCL 50 MG/ML IJ SOLN
25.0000 mg | Freq: Once | INTRAMUSCULAR | Status: AC
  Administered 2024-07-23: 25 mg via INTRAVENOUS
  Filled 2024-07-23: qty 1

## 2024-07-23 MED ORDER — FAMOTIDINE IN NACL 20-0.9 MG/50ML-% IV SOLN
20.0000 mg | Freq: Once | INTRAVENOUS | Status: AC
  Administered 2024-07-23: 20 mg via INTRAVENOUS
  Filled 2024-07-23: qty 50

## 2024-07-23 MED ORDER — LACTATED RINGERS IV BOLUS
500.0000 mL | Freq: Once | INTRAVENOUS | Status: AC
  Administered 2024-07-23: 500 mL via INTRAVENOUS

## 2024-07-23 MED ORDER — METOCLOPRAMIDE HCL 5 MG/ML IJ SOLN
10.0000 mg | Freq: Once | INTRAMUSCULAR | Status: AC
  Administered 2024-07-23: 10 mg via INTRAVENOUS

## 2024-07-23 MED ORDER — SODIUM CHLORIDE 0.9 % IV SOLN: 250.0000 mL | INTRAVENOUS | Status: AC

## 2024-07-23 MED ORDER — LACTATED RINGERS IV BOLUS
1000.0000 mL | Freq: Once | INTRAVENOUS | Status: AC
  Administered 2024-07-23: 1000 mL via INTRAVENOUS

## 2024-07-23 MED ORDER — DIPHENHYDRAMINE HCL 50 MG/ML IJ SOLN
12.5000 mg | Freq: Once | INTRAMUSCULAR | Status: AC
  Administered 2024-07-23: 12.5 mg via INTRAVENOUS

## 2024-07-23 MED ORDER — EPINEPHRINE 0.3 MG/0.3ML IJ SOAJ
0.3000 mg | Freq: Once | INTRAMUSCULAR | Status: AC
  Administered 2024-07-23: 0.3 mg via INTRAMUSCULAR
  Filled 2024-07-23: qty 0.3

## 2024-07-23 MED ORDER — VANCOMYCIN HCL 2000 MG/400ML IV SOLN
2000.0000 mg | Freq: Once | INTRAVENOUS | Status: AC
  Administered 2024-07-23: 2000 mg via INTRAVENOUS
  Filled 2024-07-23: qty 400

## 2024-07-23 NOTE — ED Provider Notes (Signed)
 [place] [facility] AT [facility] Provider Note   CSN: [id number] Arrival date & time: 07/23/24  1645     Patient presents with: Emesis, Hypotension, and Shortness of Breath   Shane [name] is a 83 y.o. male.   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  Patient reports that he had not been feeling well for several days.  Says that just prior to arrival started experiencing nausea and vomiting.  Also has a rash that he says been present for a while but says it is itching.  Has an allergy to codeine but no known exposure to codeine or any other foods or medicines that could be causing an allergic reaction.  EMS gave him Zofran  but is still having persistent vomiting.  Says he is also having chest and abdominal pain       Prior to Admission medications   Medication Sig Start Date End Date Taking? Authorizing Provider  Acetaminophen  (TYLENOL  PO) Take 2 tablets by mouth as needed. May take 2 tablet by mouth 3 hours after taking the trazodone  75mg  prn.    [provider]  albuterol  (PROVENTIL ) (2.5 MG/3ML) 0.083% nebulizer solution Take 2.5 mg by nebulization 2 (two) times daily as needed for wheezing or shortness of breath.    [provider]  albuterol  (VENTOLIN  HFA) 108 (90 Base) MCG/ACT inhaler Inhale 2 puffs into the lungs every 4 (four) hours as needed for wheezing or shortness of breath. Patient taking differently: Inhale 3 puffs into the lungs every 4 (four) hours as needed for wheezing or shortness of breath. 04/12/21   Angiulli, Toribio PARAS, PA-C  Ascorbic Acid  (VITAMIN C  WITH ROSE HIPS) 500 MG tablet Take 1 tablet (500 mg total) by mouth daily. Patient taking differently: Take 500 mg by mouth in the morning. 04/12/21   Angiulli, Toribio PARAS, PA-C  atorvastatin  (LIPITOR ) 80 MG tablet TAKE 1 TABLET BY MOUTH  EVERY DAY Patient taking differently: Take 80 mg by mouth at bedtime. 07/25/22   Pietro Redell RAMAN, MD  benzonatate  (TESSALON ) 100 MG capsule Take 1 capsule (100 mg total) by mouth 3 (three) times daily. Patient taking differently: Take 100 mg by mouth as needed for cough. 12/29/23   Tobie Yetta HERO, MD  cyanocobalamin  1000 MCG tablet Take 1 tablet (1,000 mcg total) by mouth daily. Patient taking differently: Take 1,000 mcg by mouth in the morning. 12/30/23   Tobie Yetta HERO, MD  dextromethorphan  (DELSYM ) 30 MG/5ML liquid Take 2.5 mLs (15 mg total) by mouth 2 (two) times daily. Patient taking differently: Take 15 mg by mouth at bedtime. 12/29/23   Tobie Yetta HERO, MD  docusate sodium  (COLACE) 100 MG capsule Take 1 capsule (100 mg total) by mouth 2 (two) times daily. Patient taking differently: Take 100 mg by mouth as needed for mild constipation or moderate constipation. 12/29/23   Tobie Yetta HERO, MD  escitalopram  (LEXAPRO ) 10 MG tablet Take 1 tablet (10 mg total) by mouth daily. Patient taking differently: Take 10 mg by mouth in the morning. 04/12/21   Angiulli, Toribio PARAS, PA-C  furosemide  (LASIX ) 40 MG tablet Take 1 tablet (40 mg total) by mouth 2 (two) times daily. 04/14/24 04/14/25  Fairy Frames, MD  gabapentin  (NEURONTIN ) 100 MG capsule Take 1 capsule (100 mg total) by mouth 3 (three) times daily. 12/29/23   Tobie Yetta HERO, MD  HUMALOG  KWIKPEN 100 UNIT/ML KwikPen Inject 10-15 Units into the skin 3 (three) times daily. Can take 15-18 Units with meals while on Prednisone  for 3days 04/14/24   Fairy Frames, MD  HYDROcodone  bit-homatropine [facility]) 5-1.5 MG/5ML syrup Take 5 mLs by mouth every 6 (six) hours as needed for cough. 12/29/23   Tobie Yetta HERO, MD  HYDROcodone -acetaminophen  (NORCO/VICODIN) 5-325 MG tablet Take 1 tablet by mouth at bedtime as needed.    [provider]  hydrOXYzine  (ATARAX ) 25 MG tablet Take 25 mg by mouth. 03/06/24   [provider]  lisinopril  (ZESTRIL ) 10 MG  tablet Take 10 mg by mouth daily. 05/08/24   [provider]  LORazepam  (ATIVAN ) 1 MG tablet Take 1 tablet (1 mg total) by mouth at bedtime as needed for sleep. 12/29/23   Tobie Yetta HERO, MD  Magnesium  200 MG TABS 1 tablet at bedtime Orally Once a day 03/06/24   [provider]  methocarbamol  (ROBAXIN ) 500 MG tablet Take 1 tablet (500 mg total) by mouth every 8 (eight) hours as needed for muscle spasms. 12/29/23   Tobie Yetta HERO, MD  Multiple Vitamin (MULTIVITAMIN WITH MINERALS) TABS tablet Take 1 tablet by mouth in the morning.    [provider]  mupirocin  ointment (BACTROBAN ) 2 % Apply 1 Application topically at bedtime. 06/06/23   [provider]  Nutritional Supplements (FEEDING SUPPLEMENT, NEPRO CARB STEADY,) LIQD Take 237 mLs by mouth 2 (two) times daily between meals. 12/29/23   Patel, Pranav M, MD  Olopatadine HCl (PATADAY OP) Place 2 drops into both eyes daily as needed (allergies).    [provider]  ondansetron  (ZOFRAN ) 4 MG tablet Take 4 mg by mouth as needed for nausea or vomiting. 06/15/23   [provider]  OXYGEN Inhale 3 L into the lungs See admin instructions. Inhale 3L of Oxygen everynight at bedside. May uses throughout the day as needed.    [provider]  pantoprazole  (PROTONIX ) 40 MG tablet Take 1 tablet (40 mg total) by mouth 2 (two) times daily. 04/12/21   Angiulli, Daniel J, PA-C  polyethylene glycol powder (GLYCOLAX /MIRALAX ) 17 GM/SCOOP powder Take 17 g by mouth as needed for mild constipation or moderate constipation. 11/15/20   [provider]  traZODone  (DESYREL ) 50 MG tablet Take 75 mg by mouth at bedtime as needed for sleep. 05/11/23   [provider]  TRESIBA  FLEXTOUCH 100 UNIT/ML FlexTouch Pen Inject 25 Units into the skin in the morning. 04/14/24   Fairy Frames, MD  zinc  sulfate 220 (50 Zn) MG capsule Take 1 capsule (220 mg total) by mouth daily. Patient taking differently: Take 220 mg by mouth  in the morning. 04/12/21   Angiulli, Toribio PARAS, PA-C    Allergies: Codeine, Ofev  [nintedanib ], Pirfenidone , and Amoxicillin    Review of Systems  Updated Vital Signs BP (!) 97/55   Pulse 72   Temp 98.2 F (36.8 C) (Rectal)   Resp 19   SpO2 100%   Physical Exam Vitals and nursing note reviewed.  Constitutional:      General: He is in acute distress.     Appearance: He is well-developed. He is ill-appearing.     Comments: Vomiting during the entire exam does not want to stay still  HENT:     Head: Normocephalic and atraumatic.     Right Ear: External ear normal.     Left Ear: External ear normal.     Nose: Nose normal.  Eyes:  Extraocular Movements: Extraocular movements intact.     Conjunctiva/sclera: Conjunctivae normal.     Pupils: Pupils are equal, round, and reactive to light.  Cardiovascular:     Rate and Rhythm: Regular rhythm. Tachycardia present.     Heart sounds: Normal heart sounds.  Pulmonary:     Effort: Pulmonary effort is normal. No respiratory distress.     Breath sounds: Rales (Left-sided) present.     Comments: On high flow nasal cannula Abdominal:     General: There is distension.     Palpations: Abdomen is soft. There is no mass.     Tenderness: There is abdominal tenderness. There is no guarding.  Musculoskeletal:     Cervical back: Normal range of motion and neck supple.  Skin:    General: Skin is warm and dry.     Findings: Rash (urticarial) present.  Neurological:     Mental Status: He is alert. Mental status is at baseline.  Psychiatric:        Mood and Affect: Mood normal.        Behavior: Behavior normal.     (all labs ordered are listed, but only abnormal results are displayed) Labs Reviewed  COMPREHENSIVE METABOLIC PANEL WITH GFR - Abnormal; Notable for the following components:      Result Value   Sodium 134 (*)    Chloride 95 (*)    Glucose, Bld 158 (*)    BUN 53 (*)    Creatinine, Ser 1.67 (*)    Calcium  8.1 (*)    Albumin   3.1 (*)    Total Bilirubin 1.7 (*)    GFR, Estimated 40 (*)    Anion gap 16 (*)    All other components within normal limits  CBC WITH DIFFERENTIAL/PLATELET - Abnormal; Notable for the following components:   WBC 15.1 (*)    Neutro Abs 12.4 (*)    Abs Immature Granulocytes 0.08 (*)    All other components within normal limits  LACTIC ACID, PLASMA - Abnormal; Notable for the following components:   Lactic Acid, Venous 4.1 (*)    All other components within normal limits  LACTIC ACID, PLASMA - Abnormal; Notable for the following components:   Lactic Acid, Venous 7.4 (*)    All other components within normal limits  CBG MONITORING, ED - Abnormal; Notable for the following components:   Glucose-Capillary 160 (*)    All other components within normal limits  I-STAT VENOUS BLOOD GAS, ED - Abnormal; Notable for the following components:   pO2, Ven 28 (*)    Calcium , Ion 1.02 (*)    All other components within normal limits  I-STAT CHEM 8, ED - Abnormal; Notable for the following components:   BUN 76 (*)    Creatinine, Ser 1.70 (*)    Glucose, Bld 153 (*)    Calcium , Ion 1.00 (*)    All other components within normal limits  I-STAT CG4 LACTIC ACID, ED - Abnormal; Notable for the following components:   Lactic Acid, Venous 4.4 (*)    All other components within normal limits  TROPONIN I (HIGH SENSITIVITY) - Abnormal; Notable for the following components:   Troponin I (High Sensitivity) 18 (*)    All other components within normal limits  TROPONIN I (HIGH SENSITIVITY) - Abnormal; Notable for the following components:   Troponin I (High Sensitivity) 93 (*)    All other components within normal limits  RESP PANEL BY RT-PCR (RSV, FLU A&B, COVID)  RVPGX2  CULTURE,  BLOOD (ROUTINE X 2)  CULTURE, BLOOD (ROUTINE X 2)  LIPASE, BLOOD  URINALYSIS, ROUTINE W REFLEX MICROSCOPIC    EKG: EKG Interpretation Date/Time:  Tuesday July 23 2024 18:19:55 EDT Ventricular Rate:  100 PR  Interval:  88 QRS Duration:  194 QT Interval:  425 QTC Calculation: 549 R Axis:   233  Text Interpretation: Sinus tachycardia Consider right atrial enlargement Nonspecific intraventricular conduction delay Confirmed by Yolande Charleston [phone]) on 07/23/2024 7:02:34 PM  Radiology: CT Angio Chest PE W and/or Wo Contrast Result Date: 07/23/2024 CLINICAL DATA:  Chest pain and shortness of breath. Concern for pulmonary disease. EXAM: CT ANGIOGRAPHY CHEST WITH CONTRAST TECHNIQUE: Multidetector CT imaging of the chest was performed using the standard protocol during bolus administration of intravenous contrast. Multiplanar CT image reconstructions and MIPs were obtained to evaluate the vascular anatomy. RADIATION DOSE REDUCTION: This exam was performed according to the departmental dose-optimization program which includes automated exposure control, adjustment of the mA and/or kV according to patient size and/or use of iterative reconstruction technique. CONTRAST:  75mL OMNIPAQUE  IOHEXOL  350 MG/ML SOLN COMPARISON:  Chest CT dated 05/31/2024. FINDINGS: Evaluation of this exam is limited due to respiratory motion. Cardiovascular: There is mild cardiomegaly. No pericardial effusion. There is coronary vascular calcification. Aortic valve repair. Mild atherosclerotic calcification of the thoracic aorta. No aneurysmal dilatation. Evaluation of the pulmonary arteries is limited due to respiratory motion as well as streak artifact caused by patient's arms. No porta artery embolus identified. Mediastinum/Nodes: Mildly enlarged right hilar lymph nodes measure 11 mm. The esophagus is grossly unremarkable no mediastinal fluid collection. Lungs/Pleura: Diffuse interstitial coarsening and fibrosis in keeping with interstitial lung disease. Small left pleural effusion. No focal consolidation or pneumothorax. The central airways are patent. Upper Abdomen: No acute abnormality. Musculoskeletal: Osteopenia with degenerative  changes. Median sternotomy wires. No acute osseous pathology. Review of the MIP images confirms the above findings. IMPRESSION: 1. No CT evidence of pulmonary artery embolus. 2. Interstitial lung disease. 3. Small left pleural effusion. 4.  Aortic Atherosclerosis (ICD10-I70.0). Electronically Signed   By: Vanetta Chou M.D.   On: 07/23/2024 19:31   CT ABDOMEN PELVIS W CONTRAST Result Date: 07/23/2024 CLINICAL DATA:  Nausea and vomiting EXAM: CT ABDOMEN AND PELVIS WITH CONTRAST TECHNIQUE: Multidetector CT imaging of the abdomen and pelvis was performed using the standard protocol following bolus administration of intravenous contrast. RADIATION DOSE REDUCTION: This exam was performed according to the departmental dose-optimization program which includes automated exposure control, adjustment of the mA and/or kV according to patient size and/or use of iterative reconstruction technique. CONTRAST:  75mL OMNIPAQUE  IOHEXOL  350 MG/ML SOLN COMPARISON:  CT abdomen and pelvis 12/30/2023. FINDINGS: Lower chest: Chronic interstitial changes are again noted in the lung bases. The heart is enlarged. Hepatobiliary: No focal liver abnormality is seen. No gallstones, gallbladder wall thickening, or biliary dilatation. Pancreas: Unremarkable. No pancreatic ductal dilatation or surrounding inflammatory changes. Spleen: Normal in size without focal abnormality. Adrenals/Urinary Tract: There is no hydronephrosis or perinephric stranding. No perinephric fluid collection identified. Knee adrenal glands and bladder are within normal limits. Stomach/Bowel: There are mildly dilated central small bowel loops. No wall thickening or inflammation. No transition point visualized. The colon is nondilated. There is diffuse colonic diverticulosis. The appendix is normal. Stomach is within normal limits. Vascular/Lymphatic: Aortic atherosclerosis. No enlarged abdominal or pelvic lymph nodes. Reproductive: Prostate is unremarkable. Other: There  is no free air. There is trace free fluid in the left upper quadrant previously identified right retroperitoneal hematoma  has significantly decreased in size. Enhancing low-attenuation areas seen in the right retroperitoneum measuring 9.1 x 2.7 x 5.3 cm. There is no focal abdominal wall hernia. Musculoskeletal: Degenerative changes affect the spine the hips. There are laminectomy defects at L3-L4. IMPRESSION: 1. Mildly dilated central small bowel loops without transition point. Findings may represent ileus or partial small bowel obstruction. 2. Trace free fluid in the left upper quadrant. 3. Enhancing low-attenuation collection in the right retroperitoneum is most likely related to resolving hematoma. Infection not excluded. 4. Colonic diverticulosis. Aortic Atherosclerosis (ICD10-I70.0). Electronically Signed   By: Greig Pique M.D.   On: 07/23/2024 19:30   DG Chest Portable 1 View Result Date: 07/23/2024 CLINICAL DATA:  Short of breath, vomiting EXAM: PORTABLE CHEST 1 VIEW COMPARISON:  04/11/2024 FINDINGS: Two frontal views of the chest demonstrate stable postsurgical changes from CABG and aortic valve replacement. Cardiac silhouette remains enlarged. Chronic background pulmonary scarring and fibrosis, with increased pulmonary vascular congestion and patchy bibasilar consolidation consistent with superimposed edema. Small bilateral effusions. No pneumothorax. IMPRESSION: 1. Congestive heart failure superimposed upon chronic pulmonary fibrosis and scarring. Electronically Signed   By: Ozell Daring M.D.   On: 07/23/2024 18:24     Procedures    EMERGENCY DEPARTMENT US  CARDIAC EXAM Study: Limited Ultrasound of the Heart and Pericardium  INDICATIONS:Abnormal vital signs Multiple views of the heart and pericardium were obtained in real-time with a multi-frequency probe.  PERFORMED AB:Fbdzoq IMAGES ARCHIVED?: No LIMITATIONS:  Body habitus VIEWS USED: Parasternal long axis and Parasternal short  axis INTERPRETATION: Cardiac activity present, Pericardial effusioin absent, and Decreased contractility  Medications Ordered in the ED  lactated ringers  infusion ( Intravenous New Bag/Given 07/23/24 1931)  0.9 %  sodium chloride  infusion (250 mLs Intravenous Not Given 07/23/24 2000)  EPINEPHrine  (EPI-PEN) injection 0.3 mg (0.3 mg Intramuscular Given 07/23/24 1715)  lactated ringers  bolus 1,000 mL (0 mLs Intravenous Stopped 07/23/24 2046)  ondansetron  (ZOFRAN ) injection 4 mg (4 mg Intravenous Given 07/23/24 1716)  ceFEPIme  (MAXIPIME ) 2 g in sodium chloride  0.9 % 100 mL IVPB (0 g Intravenous Stopped 07/23/24 1921)  metroNIDAZOLE  (FLAGYL ) IVPB 500 mg (0 mg Intravenous Stopped 07/23/24 2046)  vancomycin  (VANCOREADY) IVPB 2000 mg/400 mL (0 mg Intravenous Stopped 07/23/24 2152)  metoCLOPramide  (REGLAN ) injection 10 mg (10 mg Intravenous Given 07/23/24 1811)  diphenhydrAMINE  (BENADRYL ) injection 12.5 mg (12.5 mg Intravenous Given 07/23/24 1812)  HYDROmorphone  (DILAUDID ) injection 0.5 mg (0.5 mg Intravenous Given 07/23/24 1834)  lactated ringers  bolus 500 mL (0 mLs Intravenous Stopped 07/23/24 2047)  iohexol  (OMNIPAQUE ) 350 MG/ML injection 75 mL (75 mLs Intravenous Contrast Given 07/23/24 1914)  methylPREDNISolone  sodium succinate (SOLU-MEDROL ) 125 mg/2 mL injection 125 mg (125 mg Intravenous Given 07/23/24 1956)  famotidine  (PEPCID ) IVPB 20 mg premix (0 mg Intravenous Stopped 07/23/24 2027)  diphenhydrAMINE  (BENADRYL ) injection 12.5 mg (12.5 mg Intravenous Given 07/23/24 1956)  lactated ringers  bolus 500 mL (500 mLs Intravenous New Bag/Given 07/23/24 2114)    Clinical Course as of 07/23/24 2255  Tue Jul 23, 2024  1717 Full code verified with patient and wife [RP]  2021 Discussed with Dr. Paola from general surgery who will continue to follow the patient. [RP]  225[address] Dw Dr Silvester from hospitalist [RP]    Clinical Course User Index [RP] Yolande Lamar BROCKS, MD                                 Medical Decision  Making Amount  and/or Complexity of Data Reviewed Labs: ordered. Radiology: ordered.  Risk Prescription drug management. Decision regarding hospitalization.   Stefen Juba is a 83 year old male history of CAD status post CABG, CHF, sick sinus syndrome, pulmonary fibrosis on home oxygen, TAVR, PE on Eliquis  who presents emergency department with nausea and vomiting.   Initial Ddx:  Anaphylaxis, septic shock, SBO, hypovolemic shock, intra-abdominal infection  MDM/Course:  Patient presents emergency department with nausea and vomiting.  Also was found to have a rash.  Is hypotensive.  No clear exposures to allergens but was given epinephrine  in case of anaphylaxis.  Was also given IV fluids, Benadryl , and steroids.  With his abdominal pain and chest pain did obtain a CTA of the chest that did not show acute findings but showed interstitial lung disease.  Did obtain a CT of the abdomen pelvis as well which shows a resolving RP hematoma that he is known about as well as a partial bowel obstruction.  Upon re-evaluation nausea and vomiting was able to be controlled after Zofran  and Reglan .  Hold off on NG tube at this point in time because of that.  General surgery was consulted and will follow the patient.  Believe his hypotension and vomiting and abdominal tenderness with elevated white blood cell count was started on broad-spectrum antibiotics in case of intra-abdominal infection but feel that is less likely the cause of his symptoms.  Was started on high flow nasal cannula on arrival but was able to be weaned down back to his 2 L home baseline.  Did have an initial increase in his lactic acid from 4-7 which I suspect was due to the epinephrine  because a came down afterwards to 4.4 again.  Discussed with the ICU who feels that he can be admitted to hospitalist at this time.  Does have an uptrending troponin which I suspect is due to demand.  With his RP hematoma walled off on aspirin  and  anticoagulation at this point in time.  This patient presents to the ED for concern of complaints listed in HPI, this involves an extensive number of treatment options, and is a complaint that carries with it a high risk of complications and morbidity. Disposition including potential need for admission considered.   Dispo: Admit  Additional history obtained from spouse Records reviewed Outpatient Clinic Notes The following labs were independently interpreted: Chemistry and show CKD I independently reviewed the following imaging with scope of interpretation limited to determining acute life threatening conditions related to emergency care: Chest x-ray and agree with the radiologist interpretation with the following exceptions: none I personally reviewed and interpreted cardiac monitoring: sinus tachycardia I personally reviewed and interpreted the pt's EKG: see above for interpretation  I have reviewed the patients home medications and made adjustments as needed Consults: Critical care, General Surgery, and Hospitalist Social Determinants of health:  Geriatric  Portions of this note were generated with Scientist, clinical (histocompatibility and immunogenetics). Dictation errors may occur despite best attempts at proofreading.    CRITICAL CARE Performed by: Lamar JAYSON Shan   Total critical care time: 30 minutes  Critical care time was exclusive of separately billable procedures and treating other patients.  Critical care was necessary to treat or prevent imminent or life-threatening deterioration.  Critical care was time spent personally by me on the following activities: development of treatment plan with patient and/or surrogate as well as nursing, discussions with consultants, evaluation of patient's response to treatment, examination of patient, obtaining history from patient or surrogate, ordering and  performing treatments and interventions, ordering and review of laboratory studies, ordering and review of  radiographic studies, pulse oximetry and re-evaluation of patient's condition.  Final diagnoses:  Anaphylaxis, initial encounter  Shock (HCC)  Nausea and vomiting, unspecified vomiting type  Partial small bowel obstruction [facility])    ED Discharge Orders     None          Yolande Lamar BROCKS, MD 07/23/24 2242

## 2024-07-23 NOTE — ED Triage Notes (Signed)
 Wife reports patient has been sick for the past 2-3 days, patient has hives on his back which family and patient report are present all the time.

## 2024-07-23 NOTE — Sepsis Progress Note (Signed)
 Sepsis protocol monitored by eLink

## 2024-07-23 NOTE — H&P (Signed)
 Shane [name] FMW:[id number] DOB: January 16, 1941 DOA: 07/23/2024    PCP: Shane Ade, MD   [facility]  CARDS: Dr. Redell Shallow, MD   Pulmonary   Dr. Geronimo    Patient arrived to ER on 07/23/24 at 1645 Referred by Attending Shane Lamar BROCKS, MD   Patient coming from:    home Lives With family     Chief Complaint: Nausea vomiting  Chief Complaint  Patient presents with   Emesis   Hypotension   Shortness of Breath    HPI: Shane [name] is a 83 y.o. 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 He had a rush Initial bp was in 70's Imaging worrisome for SBO Hx of retroperitoneal hematoma that seems unchanged, Seen in January Hx of adrenal insufficiency Initially started on ABx General Surgery will see in consult   Patient had complex history in January 2025 he developed COVID-19 followed by retroperitoneal hematoma requiring CRRT/hemodialysis and was transferred to rehab in May he was found to have VQ scan positive for PE he has chronic anemia and chronic CKD  Patient has been working outside on his truck unsure of any tick exposures  Have eaten beef for the first time in a while yesterday Otherwise no changes in medications, no soaps  No ne laundry detergent no travel   Denies significant ETOH intake   Does not smoke      Regarding pertinent Chronic problems:     Hyperlipidemia -  on statins Lipitor  (atorvastatin )  Lipid Panel     Component Value Date/Time   CHOL 77 12/07/2023 0116   TRIG 126 12/07/2023 0116   HDL 26 (L)  12/07/2023 0116   CHOLHDL 3.0 12/07/2023 0116   VLDL 25 12/07/2023 0116   LDLCALC 26 12/07/2023 0116     HTN on lisinopril    chronic CHF diastolic - last echo on Lasix  Recent Results (from the past 56199 hours)  ECHOCARDIOGRAM COMPLETE   Collection Time: 04/11/24 11:07 AM  Result Value   Weight 3,872   Height 72   BP 126/72   Single Plane A2C EF 46.2   Single Plane A4C EF 31.0   Calc EF 39.9   S' Lateral 4.60   Area-P 1/2 3.65   Est EF 35 - 40%   Narrative      ECHOCARDIOGRAM REPORT      IMPRESSIONS    1. Left ventricular ejection fraction, by estimation, is 35 to 40%. Left ventricular ejection fraction by 2D MOD biplane is 39.9 %. The left ventricle has moderately decreased function. The left ventricle demonstrates regional wall motion abnormalities  (see scoring diagram/findings for description). There is severe concentric left ventricular hypertrophy. Left ventricular diastolic parameters are indeterminate.  2. Right ventricular systolic function is normal. The right ventricular size is normal.  3. The mitral valve is normal in structure. No evidence of mitral valve regurgitation. No evidence of mitral stenosis.  4. The aortic valve has been repaired/replaced. Aortic valve regurgitation is not visualized. No aortic stenosis is present. There is a 29 mm native pulmonic valve present in the aortic position.  5. The inferior vena  cava is normal in size with greater than 50% respiratory variability, suggesting right atrial pressure of 3 mmHg.              CAD  - On Aspirin , statin, betablocker,                 -  followed by cardiology                       DM 2 -  Lab Results  Component Value Date   HGBA1C 8.7 (H) 04/09/2024   on insulin ,     obesity-   BMI Readings from Last 1 Encounters:  05/09/24 31.57 kg/m   Pulmonary fibrosis followed by pulmonology    OSA -on nocturnal oxygen, *CPAP, *noncompliant with CPAP     A. Fib -   atrial fibrillation CHA2DS2  vas score  8       current  on anticoagulation with   Eliquis ,     Hx of  PE on - anticoagulation with   Eliquis ,      CKD stage IIIb   baseline Cr 1.7 CrCl cannot be calculated (Unknown ideal weight.).  Lab Results  Component Value Date   CREATININE 1.70 (H) 07/23/2024   CREATININE 1.67 (H) 07/23/2024   CREATININE 1.60 (H) 04/14/2024   Lab Results  Component Value Date   NA 136 07/23/2024   CL 101 07/23/2024   K 4.7 07/23/2024   CO2 23 07/23/2024   BUN 76 (H) 07/23/2024   CREATININE 1.70 (H) 07/23/2024   GFRNONAA 40 (L) 07/23/2024   CALCIUM  8.1 (L) 07/23/2024   PHOS 4.4 12/27/2023   ALBUMIN  3.1 (L) 07/23/2024   GLUCOSE 153 (H) 07/23/2024     Chronic anemia - baseline hg Hemoglobin & Hematocrit now improved Recent Labs    07/23/24 1731 07/23/24 1737 07/23/24 1831  HGB 14.7 15.0 15.6   Iron /TIBC/Ferritin/ %Sat    Component Value Date/Time   IRON  20 (L) 12/07/2023 0116   TIBC 312 12/07/2023 0116   FERRITIN 30 12/07/2023 0116   IRONPCTSAT 6 (L) 12/07/2023 0116      While in ER: Clinical Course as of 07/23/24 2337  Tue Jul 23, 2024  1717 Full code verified with patient and wife [RP]  2021 Discussed with Dr. Paola from general surgery who will continue to follow the patient. [RP]  2254 Dw Dr Silvester from hospitalist [RP]  2310 Troponin I (High Sensitivity)(!): 93 [CC]    Clinical Course User Index [CC] Jerral Meth, MD [RP] Shane Lamar BROCKS, MD       Lab Orders         Blood culture (routine x 2)         Resp panel by RT-PCR (RSV, Flu A&B, Covid) Anterior Nasal Swab         Comprehensive metabolic panel         Lipase, blood         CBC with Diff         Urinalysis, Routine w reflex microscopic -Urine, Clean Catch         Lactic acid, plasma         POC CBG, ED         I-Stat venous blood gas, ([facility] ED, [facility], [facility])         I-stat chem 8, ED (not at [facility], [facility] or [facility])         I-Stat CG4 Lactic Acid  CXR -  Congestive heart failure  superimposed upon chronic pulmonary fibrosis and scarring  CTabd/pelvis -  ileus or partial small bowel obstruction.  in the right retroperitoneum is most likely related to resolving hematoma. Infection not excluded.  CTA chest -  no PE, Interstitial lung disease.   Following Medications were ordered in ER: Medications  lactated ringers  infusion ( Intravenous New Bag/Given 07/23/24 1931)  0.9 %  sodium chloride  infusion (250 mLs Intravenous Not Given 07/23/24 2000)  EPINEPHrine  (EPI-PEN) injection 0.3 mg (0.3 mg Intramuscular Given 07/23/24 1715)  lactated ringers  bolus 1,000 mL (0 mLs Intravenous Stopped 07/23/24 2046)  ondansetron  (ZOFRAN ) injection 4 mg (4 mg Intravenous Given 07/23/24 1716)  ceFEPIme  (MAXIPIME ) 2 g in sodium chloride  0.9 % 100 mL IVPB (0 g Intravenous Stopped 07/23/24 1921)  metroNIDAZOLE  (FLAGYL ) IVPB 500 mg (0 mg Intravenous Stopped 07/23/24 2046)  vancomycin  (VANCOREADY) IVPB 2000 mg/400 mL (0 mg Intravenous Stopped 07/23/24 2152)  metoCLOPramide  (REGLAN ) injection 10 mg (10 mg Intravenous Given 07/23/24 1811)  diphenhydrAMINE  (BENADRYL ) injection 12.5 mg (12.5 mg Intravenous Given 07/23/24 1812)  HYDROmorphone  (DILAUDID ) injection 0.5 mg (0.5 mg Intravenous Given 07/23/24 1834)  lactated ringers  bolus 500 mL (0 mLs Intravenous Stopped 07/23/24 2047)  iohexol  (OMNIPAQUE ) 350 MG/ML injection 75 mL (75 mLs Intravenous Contrast Given 07/23/24 1914)  methylPREDNISolone  sodium succinate (SOLU-MEDROL ) 125 mg/2 mL injection 125 mg (125 mg Intravenous Given 07/23/24 1956)  famotidine  (PEPCID ) IVPB 20 mg premix (0 mg Intravenous Stopped 07/23/24 2027)  diphenhydrAMINE  (BENADRYL ) injection 12.5 mg (12.5 mg Intravenous Given 07/23/24 1956)  lactated ringers  bolus 500 mL (0 mLs Intravenous Stopped 07/23/24 2246)    _______________________________________________________ ER Provider Called:     Pulmonology   Dr. Maree  They Recommend admit to medicine     General Surgery - Paola Dreama SAILOR, MD  Will see pt in consult    ED Triage Vitals  Encounter Vitals Group     BP 07/23/24 1652 (!) 76/45     Girls Systolic BP Percentile --      Girls Diastolic BP Percentile --      Boys Systolic BP Percentile --      Boys Diastolic BP Percentile --      Pulse Rate 07/23/24 1653 100     Resp 07/23/24 1659 (!) 22     Temp 07/23/24 1706 (!) 97.1 F (36.2 C)     Temp Source 07/23/24 1706 Temporal     SpO2 07/23/24 1653 (!) 75 %     Weight --      Height --      Head Circumference --      Peak Flow --      Pain Score 07/23/24 1904 3     Pain Loc --      Pain Education --      Exclude from Growth Chart --   UFJK(75)@     _________________________________________ Significant initial  Findings: Abnormal Labs Reviewed  COMPREHENSIVE METABOLIC PANEL WITH GFR - Abnormal; Notable for the following components:      Result Value   Sodium 134 (*)    Chloride 95 (*)    Glucose, Bld 158 (*)    BUN 53 (*)    Creatinine, Ser 1.67 (*)    Calcium  8.1 (*)    Albumin  3.1 (*)    Total Bilirubin 1.7 (*)    GFR, Estimated 40 (*)    Anion gap 16 (*)    All other components within normal limits  CBC WITH DIFFERENTIAL/PLATELET - Abnormal; Notable for the following components:   WBC 15.1 (*)    Neutro Abs 12.4 (*)    Abs Immature Granulocytes 0.08 (*)    All other components within normal limits  LACTIC ACID, PLASMA - Abnormal; Notable for the following components:   Lactic Acid, Venous 4.1 (*)    All other components within normal limits  LACTIC ACID, PLASMA - Abnormal; Notable for the following components:   Lactic Acid, Venous 7.4 (*)    All other components within normal limits  CBG MONITORING, ED - Abnormal; Notable for the following components:   Glucose-Capillary 160 (*)    All other components within normal limits  I-STAT VENOUS BLOOD GAS, ED - Abnormal; Notable for the following components:   pO2, Ven 28 (*)    Calcium , Ion 1.02 (*)    All other components within  normal limits  I-STAT CHEM 8, ED - Abnormal; Notable for the following components:   BUN 76 (*)    Creatinine, Ser 1.70 (*)    Glucose, Bld 153 (*)    Calcium , Ion 1.00 (*)    All other components within normal limits  I-STAT CG4 LACTIC ACID, ED - Abnormal; Notable for the following components:   Lactic Acid, Venous 4.4 (*)    All other components within normal limits  TROPONIN I (HIGH SENSITIVITY) - Abnormal; Notable for the following components:   Troponin I (High Sensitivity) 18 (*)    All other components within normal limits  TROPONIN I (HIGH SENSITIVITY) - Abnormal; Notable for the following components:   Troponin I (High Sensitivity) 93 (*)    All other components within normal limits      _________________________ Troponin  ordered Cardiac Panel (last 3 results) Recent Labs    07/23/24 1731 07/23/24 1938  TROPONINIHS 18* 93*     ECG: Ordered Personally reviewed and interpreted by me showing: HR : 100 Rhythm: Sinus tachycardia Consider right atrial enlargement Nonspecific intraventricular conduction delay QTC 549  BNP (last 3 results) Recent Labs    12/06/23 2310 04/09/24 1909 04/11/24 2159  BNP 480.6* 566.3* 1,178.8*       The recent clinical data is shown below. Vitals:   07/23/24 2145 07/23/24 2215 07/23/24 2230 07/23/24 2245  BP: (!) 113/52 (!) 97/55 (!) 106/52 (!) 100/55  Pulse: 72 72 67 65  Resp: 17 19 16 18   Temp:    98.4 F (36.9 C)  TempSrc:      SpO2: 100% 100% 100% 100%      WBC     Component Value Date/Time   WBC 15.1 (H) 07/23/2024 1731   LYMPHSABS 1.8 07/23/2024 1731   LYMPHSABS 2.5 06/30/2020 1545   MONOABS 0.7 07/23/2024 1731   EOSABS 0.1 07/23/2024 1731   EOSABS 0.5 (H) 06/30/2020 1545   BASOSABS 0.0 07/23/2024 1731   BASOSABS 0.1 06/30/2020 1545     Lactic Acid, Venous    Component Value Date/Time   LATICACIDVEN 4.4 (HH) 07/23/2024 2206     Lactic Acid, Venous    Component Value Date/Time   LATICACIDVEN 4.4 (HH)  07/23/2024 2206    Procalcitonin   Ordered      UA   ordered    Results for orders placed or performed during the hospital encounter of 07/23/24  Resp panel by RT-PCR (RSV, Flu A&B, Covid)     Status: None   Collection Time: 07/23/24  5:07 PM   Specimen: Nasal Swab  Result Value Ref Range  Status   SARS Coronavirus 2 by RT PCR NEGATIVE NEGATIVE Final   Influenza A by PCR NEGATIVE NEGATIVE Final   Influenza B by PCR NEGATIVE NEGATIVE Final         Resp Syncytial Virus by PCR NEGATIVE NEGATIVE Final         *Note: Due to a large number of results and/or encounters for the requested time period, some results have not been displayed. A complete set of results can be found in Results Review.    ABX started Antibiotics Given (last 72 hours)     Date/Time Action Medication Dose Rate   07/23/24 1812 New Bag/Given   ceFEPIme  (MAXIPIME ) 2 g in sodium chloride  0.9 % 100 mL IVPB 2 g 200 mL/hr   07/23/24 1933 New Bag/Given   vancomycin  (VANCOREADY) IVPB 2000 mg/400 mL 2,000 mg 200 mL/hr   07/23/24 1936 New Bag/Given   metroNIDAZOLE  (FLAGYL ) IVPB 500 mg 500 mg 100 mL/hr        No results found for the last 90 days.   VBG ordered   ___________________________________________ Recent Labs  Lab 07/23/24 1731 07/23/24 1737 07/23/24 1831  NA 134* 137 136  K 4.9 4.8 4.7  CO2 23  --   --   GLUCOSE 158*  --  153*  BUN 53*  --  76*  CREATININE 1.67*  --  1.70*  CALCIUM  8.1*  --   --     Cr   stable,    Lab Results  Component Value Date   CREATININE 1.70 (H) 07/23/2024   CREATININE 1.67 (H) 07/23/2024   CREATININE 1.60 (H) 04/14/2024    Recent Labs  Lab 07/23/24 1731  AST 39  ALT 11  ALKPHOS 98  BILITOT 1.7*  PROT 6.9  ALBUMIN  3.1*   Lab Results  Component Value Date   CALCIUM  8.1 (L) 07/23/2024   PHOS 4.4 12/27/2023    Plt: Lab Results  Component Value Date   PLT 245 07/23/2024       Recent Labs  Lab 07/23/24 1731 07/23/24 1737 07/23/24 1831  WBC 15.1*   --   --   NEUTROABS 12.4*  --   --   HGB 14.7 15.0 15.6  HCT 44.8 44.0 46.0  MCV 93.3  --   --   PLT 245  --   --     HG/HCT * stable,  Down *Up from baseline see below    Component Value Date/Time   HGB 15.6 07/23/2024 1831   HGB 14.3 06/30/2020 1545   HCT 46.0 07/23/2024 1831   HCT 40.5 06/30/2020 1545   MCV 93.3 07/23/2024 1731   MCV 87 06/30/2020 1545      Recent Labs  Lab 07/23/24 1731  LIPASE 33   No results for input(s): AMMONIA in the last 168 hours.    _______________________________________________ Hospitalist was called for admission for   Anaphylaxis   Nausea and vomiting, SBO   The following Work up has been ordered so far:  Orders Placed This Encounter  Procedures   Blood culture (routine x 2)   Resp panel by RT-PCR (RSV, Flu A&B, Covid) Anterior Nasal Swab   DG Chest Portable 1 View   CT ABDOMEN PELVIS W CONTRAST   CT Angio Chest PE W and/or Wo Contrast   Comprehensive metabolic panel   Lipase, blood   CBC with Diff   Urinalysis, Routine w reflex microscopic -Urine, Clean Catch   Lactic acid, plasma   Diet NPO time specified  Initiate Carrier Fluid Protocol   Document height and weight   Assess and Document Glasgow Coma Scale   Document vital signs within 1-hour of fluid bolus completion. Notify provider of abnormal vital signs despite fluid resuscitation.   DO NOT delay antibiotics if unable to obtain blood culture.   Refer to Sidebar Report: Sepsis Sidebar ED/IP   Notify provider for difficulties obtaining IV access.   Insert peripheral IV x 2   Vasopressor infusion via peripheral IV access   Assess IV site every 2 hours for vasopressor extravasation   IV site should be re-confirmed by blood return every 24 hours of vasopressor administration   Peripheral administration of vasopressors should not exceed 72 hours   Full code   Code Sepsis activation.  This occurs automatically when order is signed and prioritizes pharmacy, lab, and  radiology services for STAT collections and interventions.  If [facility] downtime, call [facility] [phone]) to activate Code Sepsis.   monitoring by pharmacy   pharmacy consult   Consult to general surgery  sbo   Consult to intensivist   Consult to hospitalist   Oxygen therapy Mode or (Route): Nasal cannula   POC CBG, ED   I-Stat venous blood gas, ([facility], [facility], [facility])   I-stat chem 8, ED (not at [facility], [facility] or [facility])   I-Stat CG4 Lactic Acid   EKG 12-Lead   Insert peripheral IV     OTHER Significant initial  Findings:  labs showing:     DM  labs:  HbA1C: Recent Labs    12/06/23 2311 04/09/24 2338  HGBA1C 8.5* 8.7*       CBG (last 3)  Recent Labs    07/23/24 1937  GLUCAP 160*          Cultures:    Component Value Date/Time   SDES BLOOD RIGHT HAND 04/09/2024 2340   SPECREQUEST  04/09/2024 2340    BOTTLES DRAWN AEROBIC AND ANAEROBIC Blood Culture adequate volume   CULT  04/09/2024 2340    NO GROWTH 5 DAYS Performed at [facility], [address], [place], [place] [address]    REPTSTATUS 04/15/2024 FINAL 04/09/2024 2340     Radiological Exams on Admission: CT Angio Chest PE W and/or Wo Contrast Result Date: 07/23/2024 CLINICAL DATA:  Chest pain and shortness of breath. Concern for pulmonary disease. EXAM: CT ANGIOGRAPHY CHEST WITH CONTRAST TECHNIQUE: Multidetector CT imaging of the chest was performed using the standard protocol during bolus administration of intravenous contrast. Multiplanar CT image reconstructions and MIPs were obtained to evaluate the vascular anatomy. RADIATION DOSE REDUCTION: This exam was performed according to the departmental dose-optimization program which includes automated exposure control, adjustment of the mA and/or kV according to patient size and/or use of iterative reconstruction technique. CONTRAST:  75mL OMNIPAQUE  IOHEXOL  350 MG/ML SOLN COMPARISON:  Chest CT dated 05/31/2024. FINDINGS: Evaluation of this exam is limited due to  respiratory motion. Cardiovascular: There is mild cardiomegaly. No pericardial effusion. There is coronary vascular calcification. Aortic valve repair. Mild atherosclerotic calcification of the thoracic aorta. No aneurysmal dilatation. Evaluation of the pulmonary arteries is limited due to respiratory motion as well as streak artifact caused by patient's arms. No porta artery embolus identified. Mediastinum/Nodes: Mildly enlarged right hilar lymph nodes measure 11 mm. The esophagus is grossly unremarkable no mediastinal fluid collection. Lungs/Pleura: Diffuse interstitial coarsening and fibrosis in keeping with interstitial lung disease. Small left pleural effusion. No focal consolidation or pneumothorax. The central airways are patent. Upper Abdomen: No acute  abnormality. Musculoskeletal: Osteopenia with degenerative changes. Median sternotomy wires. No acute osseous pathology. Review of the MIP images confirms the above findings. IMPRESSION: 1. No CT evidence of pulmonary artery embolus. 2. Interstitial lung disease. 3. Small left pleural effusion. 4.  Aortic Atherosclerosis (ICD10-I70.0). Electronically Signed   By: Vanetta Chou M.D.   On: 07/23/2024 19:31   CT ABDOMEN PELVIS W CONTRAST Result Date: 07/23/2024 CLINICAL DATA:  Nausea and vomiting EXAM: CT ABDOMEN AND PELVIS WITH CONTRAST TECHNIQUE: Multidetector CT imaging of the abdomen and pelvis was performed using the standard protocol following bolus administration of intravenous contrast. RADIATION DOSE REDUCTION: This exam was performed according to the departmental dose-optimization program which includes automated exposure control, adjustment of the mA and/or kV according to patient size and/or use of iterative reconstruction technique. CONTRAST:  75mL OMNIPAQUE  IOHEXOL  350 MG/ML SOLN COMPARISON:  CT abdomen and pelvis 12/30/2023. FINDINGS: Lower chest: Chronic interstitial changes are again noted in the lung bases. The heart is enlarged.  Hepatobiliary: No focal liver abnormality is seen. No gallstones, gallbladder wall thickening, or biliary dilatation. Pancreas: Unremarkable. No pancreatic ductal dilatation or surrounding inflammatory changes. Spleen: Normal in size without focal abnormality. Adrenals/Urinary Tract: There is no hydronephrosis or perinephric stranding. No perinephric fluid collection identified. Knee adrenal glands and bladder are within normal limits. Stomach/Bowel: There are mildly dilated central small bowel loops. No wall thickening or inflammation. No transition point visualized. The colon is nondilated. There is diffuse colonic diverticulosis. The appendix is normal. Stomach is within normal limits. Vascular/Lymphatic: Aortic atherosclerosis. No enlarged abdominal or pelvic lymph nodes. Reproductive: Prostate is unremarkable. Other: There is no free air. There is trace free fluid in the left upper quadrant previously identified right retroperitoneal hematoma has significantly decreased in size. Enhancing low-attenuation areas seen in the right retroperitoneum measuring 9.1 x 2.7 x 5.3 cm. There is no focal abdominal wall hernia. Musculoskeletal: Degenerative changes affect the spine the hips. There are laminectomy defects at L3-L4. IMPRESSION: 1. Mildly dilated central small bowel loops without transition point. Findings may represent ileus or partial small bowel obstruction. 2. Trace free fluid in the left upper quadrant. 3. Enhancing low-attenuation collection in the right retroperitoneum is most likely related to resolving hematoma. Infection not excluded. 4. Colonic diverticulosis. Aortic Atherosclerosis (ICD10-I70.0). Electronically Signed   By: Greig Pique M.D.   On: 07/23/2024 19:30   DG Chest Portable 1 View Result Date: 07/23/2024 CLINICAL DATA:  Short of breath, vomiting EXAM: PORTABLE CHEST 1 VIEW COMPARISON:  04/11/2024 FINDINGS: Two frontal views of the chest demonstrate stable postsurgical changes from CABG  and aortic valve replacement. Cardiac silhouette remains enlarged. Chronic background pulmonary scarring and fibrosis, with increased pulmonary vascular congestion and patchy bibasilar consolidation consistent with superimposed edema. Small bilateral effusions. No pneumothorax. IMPRESSION: 1. Congestive heart failure superimposed upon chronic pulmonary fibrosis and scarring. Electronically Signed   By: Ozell Daring M.D.   On: 07/23/2024 18:24   _______________________________________________________________________________________________________ Latest  Blood pressure (!) 100/55, pulse 65, temperature 98.4 F (36.9 C), resp. rate 18, SpO2 100%.   Vitals  labs and radiology finding personally reviewed  Review of Systems:    Pertinent positives include:   abdominal pain, nausea, vomiting  Constitutional:  No weight loss, night sweats, Fevers, chills, fatigue, weight loss  HEENT:  No headaches, Difficulty swallowing,Tooth/dental problems,Sore throat,  No sneezing, itching, ear ache, nasal congestion, post nasal drip,  Cardio-vascular:  No chest pain, Orthopnea, PND, anasarca, dizziness, palpitations.no Bilateral lower extremity swelling  GI:  No  heartburn, indigestion,, diarrhea, change in bowel habits, loss of appetite, melena, blood in stool, hematemesis Resp:  no shortness of breath at rest. No dyspnea on exertion, No excess mucus, no productive cough, No non-productive cough, No coughing up of blood.No change in color of mucus.No wheezing. Skin:  no rash or lesions. No jaundice GU:  no dysuria, change in color of urine, no urgency or frequency. No straining to urinate.  No flank pain.  Musculoskeletal:  No joint pain or no joint swelling. No decreased range of motion. No back pain.  Psych:  No change in mood or affect. No depression or anxiety. No memory loss.  Neuro: no localizing neurological complaints, no tingling, no weakness, no double vision, no gait abnormality, no slurred  speech, no confusion  All systems reviewed and apart from HOPI all are negative _______________________________________________________________________________________________ Past Medical History:   Past Medical History:  Diagnosis Date   Acute blood loss anemia    Anxiety    AV block, Mobitz 1    Cataract    Chronic kidney disease    d/t DM   CKD (chronic kidney disease), stage III (HCC)    COVID-19 virus infection 07/18/2020   Last Assessment & Plan:   Formatting of this note might be different from the original.  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 10/2019 Echo: EF 55-60%, Gr2 DD. Nl RV.    Neuromuscular disorder (HCC)    Neuropathy associated with endocrine disorder (HCC)    Nonobstructive CAD (coronary artery disease)    a. 2012 Cath: mod, nonobs dzs; b. 10/2016 MV: EF 60%, no ischemia.   OSA on CPAP 05/05/2017   Unattended Home Sleep Test 7/2/813-AHI 38.6/hour, desaturation to 64%, body weight 261 pounds   PONV (postoperative nausea and vomiting)    Postoperative anemia due to acute blood loss 11/07/2020   Postoperative hemorrhagic shock 03/20/2021   Sleep apnea     uses cpap asked to bring mask and tubing     Past Surgical History:  Procedure Laterality Date   ABDOMINAL AORTOGRAM W/LOWER EXTREMITY N/A 12/10/2020   Procedure: ABDOMINAL AORTOGRAM W/LOWER EXTREMITY;  Surgeon: Magda Debby SAILOR, MD;  Location: [facility] INVASIVE [facility] LAB;  Service: Cardiovascular;  Laterality: N/A;   AMPUTATION Right 01/22/2021   Procedure:  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;  Surgeon: Harden Jerona GAILS, MD;  Location: [facility] OR;  Service: Orthopedics;  Laterality: Right;   AMPUTATION Right 03/17/2021   Procedure: RIGHT BELOW KNEE AMPUTATION;  Surgeon: Harden Jerona GAILS, MD;  Location: [facility] OR;  Service: Orthopedics;  Laterality: Right;   ANKLE FUSION Right 01/22/2021   Procedure: RIGHT FOOT TIBIOCALCANEAL FUSION;  Surgeon: Harden Jerona GAILS, MD;  Location: [facility] OR;  Service: Orthopedics;  Laterality: Right;   ANTERIOR FUSION CERVICAL SPINE  2012   CARDIAC CATHETERIZATION  2011   CARDIAC CATHETERIZATION N/A 11/09/2016   Procedure: Right Heart Cath;  Surgeon: Victory LELON Sharps, MD;  Location: [facility] INVASIVE CV LAB;  Service: Cardiovascular;  Laterality: N/A;   carpel tunnel     left wrist   CATARACT EXTRACTION     CATARACT EXTRACTION W/ INTRAOCULAR LENS  IMPLANT, BILATERAL  2013   CERVICAL LAMINECTOMY  2012   COLONOSCOPY N/A 01/14/2013   Procedure: COLONOSCOPY;  Surgeon: Norleen LOISE Kiang, MD;  Location: [facility]OSCOPY;  Service: Endoscopy;  Laterality: N/A;   CORONARY ARTERY BYPASS GRAFT  11/04/2020   LIMA-LAD, SVG-OM1, SVG-PDA (Dr Norleen Exon [facility]) dc 11/18/2020   EYE SURGERY     I & D EXTREMITY Right 02/19/2021   Procedure: RIGHT ANKLE DEBRIDEMENT AND PLACEMENT ANTIBIOTIC BEADS;  Surgeon: Harden Jerona GAILS, MD;  Location: [facility] OR;  Service: Orthopedics;  Laterality: Right;   KNEE SURGERY  1998   left   LEFT HEART CATH AND CORONARY ANGIOGRAPHY N/A 07/10/2020   Procedure: LEFT HEART CATH AND CORONARY ANGIOGRAPHY;  Surgeon: Wonda Sharper, MD;  Location: [facility] INVASIVE CV LAB;  Service: Cardiovascular;  Laterality: N/A;   LUMBAR LAMINECTOMY  2003   LUNG BIOPSY Left 12/26/2016   Procedure: LUNG BIOPSY;  Surgeon: Elspeth JAYSON Millers, MD;  Location: [facility] OR;  Service: Thoracic;  Laterality: Left;   PACEMAKER IMPLANT N/A 03/30/2020   Procedure: PACEMAKER IMPLANT;  Surgeon: Waddell Danelle ORN, MD;  Location: [facility];  Service: Cardiovascular;  Laterality: N/A;   PERIPHERAL  VASCULAR INTERVENTION Right 12/10/2020   Procedure: PERIPHERAL VASCULAR INTERVENTION;  Surgeon: Magda Debby LOISE, MD;  Location: [facility];  Service: Cardiovascular;  Laterality: Right;  SFA   POSTERIOR FUSION CERVICAL SPINE  2012   PPM GENERATOR REMOVAL N/A 12/14/2020   Procedure: PPM GENERATOR REMOVAL;  Surgeon: Waddell Danelle ORN, MD;  Location: [facility]IVE [facility] LAB;  Service: Cardiovascular;  Laterality: N/A;   TEE WITHOUT CARDIOVERSION N/A 12/11/2020   Procedure: TRANSESOPHAGEAL ECHOCARDIOGRAM (TEE);  Surgeon: Barbaraann Darryle Debby, MD;  Location: [facility] ENDOSCOPY;  Service: Cardiovascular;  Laterality: N/A;   TRIGGER FINGER RELEASE  2011   4th finger left hand   VIDEO ASSISTED THORACOSCOPY Left 12/26/2016   Procedure: VIDEO ASSISTED THORACOSCOPY;  Surgeon: Elspeth JAYSON Millers, MD;  Location: [facility] OR;  Service: Thoracic;  Laterality: Left;   VIDEO BRONCHOSCOPY N/A 12/26/2016   Procedure: VIDEO BRONCHOSCOPY;  Surgeon: Elspeth JAYSON Millers, MD;  Location: [facility] OR;  Service: Thoracic;  Laterality: N/A;    Social History:  Ambulatory    walker scooter    reports that he has never smoked. He has been exposed to tobacco smoke. He has never used smokeless tobacco. He reports that he does not drink alcohol and does not use drugs.     Family History:   Family History  Problem Relation Age of Onset   Diabetes Mellitus II Mother    Emphysema Father 61   Heart attack Father    Colon cancer Neg Hx    Esophageal cancer Neg Hx    Rectal cancer Neg Hx    Stomach cancer Neg Hx    ______________________________________________________________________________________________ Allergies: Allergies  Allergen Reactions   Codeine Hives and Itching   Ofev  [Nintedanib ] Diarrhea    SEVERE DIARRHEA   Pirfenidone  Diarrhea and Other (See Comments)    Esbriet  (Pirfenidone ) causes elevated LFTs. D/C on 06/14/17 and SEVERE DIARRHEA    Amoxicillin Itching     Prior to Admission medications   Medication Sig  Start Date End Date Taking? Authorizing Provider  albuterol  (PROVENTIL ) (2.5 MG/3ML) 0.083% nebulizer solution Take 2.5 mg by nebulization 2 (two) times daily as needed  for wheezing or shortness of breath.   Yes [provider]  albuterol  (VENTOLIN  HFA) 108 (90 Base) MCG/ACT inhaler Inhale 2 puffs into the lungs every 4 (four) hours as needed for wheezing or shortness of breath. Patient taking differently: Inhale 3 puffs into the lungs every 4 (four) hours as needed for wheezing or shortness of breath. 04/12/21  Yes Angiulli, Daniel J, PA-C  Acetaminophen  (TYLENOL  PO) Take 2 tablets by mouth as needed. May take 2 tablet by mouth 3 hours after taking the trazodone  75mg  prn.    [provider]  Ascorbic Acid  (VITAMIN C  WITH ROSE HIPS) 500 MG tablet Take 1 tablet (500 mg total) by mouth daily. Patient taking differently: Take 500 mg by mouth in the morning. 04/12/21   Angiulli, Toribio PARAS, PA-C  atorvastatin  (LIPITOR ) 80 MG tablet TAKE 1 TABLET BY MOUTH EVERY DAY Patient taking differently: Take 80 mg by mouth at bedtime. 07/25/22   Pietro Redell RAMAN, MD  benzonatate  (TESSALON ) 100 MG capsule Take 1 capsule (100 mg total) by mouth 3 (three) times daily. Patient taking differently: Take 100 mg by mouth as needed for cough. 12/29/23   Tobie Yetta HERO, MD  cyanocobalamin  1000 MCG tablet Take 1 tablet (1,000 mcg total) by mouth daily. Patient taking differently: Take 1,000 mcg by mouth in the morning. 12/30/23   Tobie Yetta HERO, MD  dextromethorphan  (DELSYM ) 30 MG/5ML liquid Take 2.5 mLs (15 mg total) by mouth 2 (two) times daily. Patient taking differently: Take 15 mg by mouth at bedtime. 12/29/23   Tobie Yetta HERO, MD  docusate sodium  (COLACE) 100 MG capsule Take 1 capsule (100 mg total) by mouth 2 (two) times daily. Patient taking differently: Take 100 mg by mouth as needed for mild constipation or moderate constipation. 12/29/23   Tobie Yetta HERO, MD  escitalopram  (LEXAPRO ) 10 MG tablet Take 1  tablet (10 mg total) by mouth daily. Patient taking differently: Take 10 mg by mouth in the morning. 04/12/21   Angiulli, Toribio PARAS, PA-C  furosemide  (LASIX ) 40 MG tablet Take 1 tablet (40 mg total) by mouth 2 (two) times daily. 04/14/24 04/14/25  Fairy Frames, MD  gabapentin  (NEURONTIN ) 100 MG capsule Take 1 capsule (100 mg total) by mouth 3 (three) times daily. 12/29/23   Tobie Yetta HERO, MD  HUMALOG  KWIKPEN 100 UNIT/ML KwikPen Inject 10-15 Units into the skin 3 (three) times daily. Can take 15-18 Units with meals while on Prednisone  for 3days 04/14/24   Fairy Frames, MD  HYDROcodone  bit-homatropine [facility]) 5-1.5 MG/5ML syrup Take 5 mLs by mouth every 6 (six) hours as needed for cough. 12/29/23   Tobie Yetta HERO, MD  HYDROcodone -acetaminophen  (NORCO/VICODIN) 5-325 MG tablet Take 1 tablet by mouth at bedtime as needed.    [provider]  hydrOXYzine  (ATARAX ) 25 MG tablet Take 25 mg by mouth. 03/06/24   [provider]  lisinopril  (ZESTRIL ) 10 MG tablet Take 10 mg by mouth daily. 05/08/24   [provider]  LORazepam  (ATIVAN ) 1 MG tablet Take 1 tablet (1 mg total) by mouth at bedtime as needed for sleep. 12/29/23   Tobie Yetta HERO, MD  Magnesium  200 MG TABS 1 tablet at bedtime Orally Once a day 03/06/24   [provider]  methocarbamol  (ROBAXIN ) 500 MG tablet Take 1 tablet (500 mg total) by mouth every 8 (eight) hours as needed for muscle spasms. 12/29/23   Tobie Yetta HERO, MD  Multiple Vitamin (MULTIVITAMIN WITH MINERALS) TABS tablet Take 1 tablet by mouth  in the morning.    [provider]  mupirocin  ointment (BACTROBAN ) 2 % Apply 1 Application topically at bedtime. 06/06/23   [provider]  Nutritional Supplements (FEEDING SUPPLEMENT, NEPRO CARB STEADY,) LIQD Take 237 mLs by mouth 2 (two) times daily between meals. 12/29/23   Patel, Pranav M, MD  Olopatadine HCl (PATADAY OP) Place 2 drops into both eyes daily as needed (allergies).    [provider]  ondansetron  (ZOFRAN ) 4 MG tablet Take 4 mg by mouth as needed for nausea or vomiting. 06/15/23   [provider]  OXYGEN Inhale 3 L into the lungs See admin instructions. Inhale 3L of Oxygen everynight at bedside. May uses throughout the day as needed.    [provider]  pantoprazole  (PROTONIX ) 40 MG tablet Take 1 tablet (40 mg total) by mouth 2 (two) times daily. 04/12/21   Angiulli, Daniel J, PA-C  polyethylene glycol powder (GLYCOLAX /MIRALAX ) 17 GM/SCOOP powder Take 17 g by mouth as needed for mild constipation or moderate constipation. 11/15/20   [provider]  traZODone  (DESYREL ) 50 MG tablet Take 75 mg by mouth at bedtime as needed for sleep. 05/11/23   [provider]  TRESIBA  FLEXTOUCH 100 UNIT/ML FlexTouch Pen Inject 25 Units into the skin in the morning. 04/14/24   Fairy Frames, MD  zinc  sulfate 220 (50 Zn) MG capsule Take 1 capsule (220 mg total) by mouth daily. Patient taking differently: Take 220 mg by mouth in the morning. 04/12/21   Pegge Toribio PARAS, PA-C    ___________________________________________________________________________________________________ Physical Exam:    07/23/2024   10:45 PM 07/23/2024   10:30 PM 07/23/2024   10:15 PM  Vitals with BMI  Systolic 100 106 97  Diastolic 55 52 55  Pulse 65 67 72     1. General:  in No  Acute distress   Chronically ill   -appearing 2. Psychological: Alert and  Oriented 3. Head/ENT:   flushed Dry Mucous Membranes                          Head Non traumatic, neck supple                           Poor Dentition 4. SKIN:   decreased Skin turgor,  Skin clean Dry and intact no rash    5. Heart: Regular rate and rhythm no  Murmur, no Rub or gallop 6. Lungs: no wheezes or crackles   7. Abdomen: Soft,  non-tender, Non distended    obese decreased bowel sounds present 8. Lower extremities: no clubbing, cyanosis, no edema right bka 9. Neurologically Grossly intact, moving all 4  extremities equally  10. MSK: Normal range of motion    Chart has been reviewed  ______________________________________________________________________________________________  Assessment/Plan 83 y.o. 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, hypertension, depression, GERD, neuropathy, sleep apnea on CPAP  Admitted for  Anaphylaxis , sbo   Present on Admission:  SBO (small bowel obstruction) (HCC)  Atrial fibrillation, chronic (HCC)  NSTEMI (non-ST elevated myocardial infarction) (HCC)  Atrial fibrillation (HCC)  History of pulmonary embolus (PE)  Retroperitoneal bleeding  SSS (sick sinus syndrome) (HCC)  Stage 3 chronic kidney disease (HCC)  Anaphylactoid reaction  Coronary artery disease involving native heart without angina pectoris/ S/P CABG x3    Atrial fibrillation, chronic (HCC) On heparin  Appreciate cardiology consult  NSTEMI (non-ST elevated myocardial infarction) (HCC) Patient received a dose of epi and was initially hypotensive continue to cycle cardiac enzymes order echogram in the morning  Last troponin elevated to 3000 Cardiology consulted Patient now on heparin  Recommended aspirin  pending Results of echogram may need cardiac catheterization in the morning  Type 2 diabetes mellitus without complication, with long-term current use of insulin  (HCC)  - Order Sensitive  SSI   - continue home insulin  but decreased to  15units,  -  check TSH and HgA1C  - Hold by mouth medications    Atrial fibrillation (HCC) Continue Eliquis  5 mg p.o. twice daily  History of pulmonary embolus (PE) Continue Eliquis  if needs to be n.p.o. will transition to heparin   OSA on CPAP Hold off on CPAP for  tonight  Retroperitoneal bleeding Chronic stable no fever or chills but noted white blood cell count elevation CT scan equivocal for tonight continue antibiotics given episodes of hypotension  S/P BKA (below knee amputation) unilateral, right (HCC) Chronic stable  S/P CABG x 3 Known history of CABG and CKD  SBO (small bowel obstruction) (HCC) Appreciate general surgery consult currently nausea vomiting has improved Keep NPO for now  Repeat KUB in AM  SSS (sick sinus syndrome) (HCC) Status post pacemaker  Stage 3 chronic kidney disease (HCC)  -chronic avoid nephrotoxic medications such as NSAIDs, Vanco Zosyn  combo,  avoid hypotension, continue to follow renal function   Anaphylactoid reaction Noted to have rash and evidence of hypotension Emergency Department responded well to steroids and epi.  Continue to monitor closely continue IV steroids Pepcid  and Benadryl  monitor progressive  Consider workup for alpha gal Given history  Coronary artery disease involving native heart without angina pectoris/ S/P CABG x3 Appreciate cardiology consult may need cardiac catheterization in a.m.    Other plan as per orders.  DVT prophylaxis:  heparin    Code Status:    Code Status: Full Code FULL CODE as per patient   I had personally discussed CODE STATUS with patient and family   ACP   none   Family Communication:   Family  at  Bedside  plan of care was discussed   with   Wife   Diet  Diet Orders (From admission, onward)     Start     Ordered   07/24/24 0041  Diet NPO time specified  Diet effective now        07/24/24 0040            Disposition Plan:     To home once workup is complete and patient is stable   Following barriers for discharge:                                                        Electrolytes corrected                                                          Pain controlled with PO medications  Will  need to be able to tolerate PO                                                      Will need consultants to evaluate patient prior to discharge                              Consult Orders  (From admission, onward)           Start     Ordered   07/23/24 2217  Consult to hospitalist  Pg by Elspeth  Once       Provider:  (Not yet assigned)  Question Answer Comment  Place call to: [facility]   Reason for Consult Admit      07/23/24 2216                     Consults called: Pulmonology consult General Surgery Cardiology consult Admission status:  ED Disposition     ED Disposition  Admit   Condition  --   Comment  Hospital Area: [facility] [[id number]]  Level of Care: Progressive [102]  Admit to Progressive based on following criteria: MULTISYSTEM THREATS such as stable sepsis, metabolic/electrolyte imbalance with or without encephalopathy that is responding to early treatment.  May admit patient to [facility] or [facility] if equivalent level of care is available:: No  Covid Evaluation: Asymptomatic - no recent exposure (last 10 days) testing not required  Diagnosis: SBO (small bowel obstruction) [facility]) [[id number]]  Admitting Physician: Tag Wurtz [[id number]]  Attending Physician: Eason Housman [[id number]]  Certification:: I certify this patient will need inpatient services for at least 2 midnights  Expected Medical Readiness: 07/25/2024          inpatient     I Expect 2 midnight stay secondary to severity of patient's current illness need for inpatient interventions justified by the following:  hemodynamic instability despite optimal treatment (tachycardia hypotension )   Severe lab/radiological/exam abnormalities including:    Anaphylaxis,  SBO  and extensive comorbidities including: DM2   CHF   That are currently affecting medical management.   I expect  patient to be hospitalized for 2 midnights requiring inpatient medical  care.  Patient is at high risk for adverse outcome (such as loss of life or disability) if not treated.  Indication for inpatient stay as follows:   inability to maintain oral hydration   Need for operative/procedural  intervention    Need for IV antibiotics, IV fluids,, IV pain medications,    Level of care      progressive   Marlen Koman 07/24/2024, 1:54 AM   [facility]     after 2 AM please page floor coverage   If 7AM-7PM, please contact the day team taking care of the patient using [email or web address]

## 2024-07-23 NOTE — H&P (Incomplete)
 Shane [name] FMW:[id number] DOB: [birth year] DOA: 07/23/2024    PCP: Shepard Ade, MD   [facility]  CARDS: Dr. Redell Shallow, MD   Pulmonary   Dr. Geronimo    Patient arrived to ER on 07/23/24 at 1645 Referred by Attending Yolande Lamar BROCKS, MD   Patient coming from:    home Lives With family     Chief Complaint: Nausea vomiting  Chief Complaint  Patient presents with  . Emesis  . Hypotension  . Shortness of Breath    HPI: Shane [name] is a 83 y.o. 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 He had a rush Initial bp was in 70's Imaging worrisome for SBO Hx of retroperitoneal hematoma that seems unchanged, Seen in January Hx of adrenal insufficiency Initially started on ABx General Surgery will see in consult   Patient had complex history in January 2025 he developed COVID-19 followed by retroperitoneal hematoma requiring CRRT/hemodialysis and was transferred to rehab in May he was found to have VQ scan positive for PE he has chronic anemia and chronic CKD  Denies significant ETOH intake *** Does not smoke*** but interested in quitting***  Denies marijuana use ***    Regarding pertinent Chronic problems:     Hyperlipidemia -  on statins Lipitor  (atorvastatin )  Lipid Panel     Component Value Date/Time   CHOL 77 12/07/2023 0116   TRIG 126 12/07/2023 0116   HDL 26 (L) 12/07/2023 0116   CHOLHDL 3.0 12/07/2023 0116   VLDL 25 12/07/2023 0116   LDLCALC 26 12/07/2023 0116     HTN on lisinopril    chronic CHF diastolic -  last echo on Lasix  Recent Results (from the past 56199 hours)  ECHOCARDIOGRAM COMPLETE   Collection Time: 04/11/24 11:07 AM  Result Value   Weight 3,872   Height 72   BP 126/72   Single Plane A2C EF 46.2   Single Plane A4C EF 31.0   Calc EF 39.9   S' Lateral 4.60   Area-P 1/2 3.65   Est EF 35 - 40%   Narrative      ECHOCARDIOGRAM REPORT      IMPRESSIONS    1. Left ventricular ejection fraction, by estimation, is 35 to 40%. Left ventricular ejection fraction by 2D MOD biplane is 39.9 %. The left ventricle has moderately decreased function. The left ventricle demonstrates regional wall motion abnormalities  (see scoring diagram/findings for description). There is severe concentric left ventricular hypertrophy. Left ventricular diastolic parameters are indeterminate.  2. Right ventricular systolic function is normal. The right ventricular size is normal.  3. The mitral valve is normal in structure. No evidence of mitral valve regurgitation. No evidence of mitral stenosis.  4. The aortic valve has been repaired/replaced. Aortic valve regurgitation is not visualized. No aortic stenosis is present. There is a 29 mm native pulmonic valve present in the aortic position.  5. The inferior vena cava is normal in size with greater than 50% respiratory variability, suggesting right atrial pressure of 3 mmHg.              CAD  - On  Aspirin , statin, betablocker,                 -  followed by cardiology                       DM 2 -  Lab Results  Component Value Date   HGBA1C 8.7 (H) 04/09/2024   on insulin ,     obesity-   BMI Readings from Last 1 Encounters:  05/09/24 31.57 kg/m   Pulmonary fibrosis followed by pulmonology    OSA -on nocturnal oxygen, *CPAP, *noncompliant with CPAP     A. Fib -   atrial fibrillation CHA2DS2 vas score  8       current  on anticoagulation with   Eliquis ,     Hx of  PE on - anticoagulation with   Eliquis ,      CKD stage IIIb   baseline Cr  1.7 CrCl cannot be calculated (Unknown ideal weight.).  Lab Results  Component Value Date   CREATININE 1.70 (H) 07/23/2024   CREATININE 1.67 (H) 07/23/2024   CREATININE 1.60 (H) 04/14/2024   Lab Results  Component Value Date   NA 136 07/23/2024   CL 101 07/23/2024   K 4.7 07/23/2024   CO2 23 07/23/2024   BUN 76 (H) 07/23/2024   CREATININE 1.70 (H) 07/23/2024   GFRNONAA 40 (L) 07/23/2024   CALCIUM  8.1 (L) 07/23/2024   PHOS 4.4 12/27/2023   ALBUMIN  3.1 (L) 07/23/2024   GLUCOSE 153 (H) 07/23/2024     Chronic anemia - baseline hg Hemoglobin & Hematocrit now improved Recent Labs    07/23/24 1731 07/23/24 1737 07/23/24 1831  HGB 14.7 15.0 15.6   Iron /TIBC/Ferritin/ %Sat    Component Value Date/Time   IRON  20 (L) 12/07/2023 0116   TIBC 312 12/07/2023 0116   FERRITIN 30 12/07/2023 0116   IRONPCTSAT 6 (L) 12/07/2023 0116      While in ER: Clinical Course as of 07/23/24 2337  Tue Jul 23, 2024  1717 Full code verified with patient and wife [RP]  2021 Discussed with Dr. Paola from general surgery who will continue to follow the patient. [RP]  2254 Dw Dr Silvester from hospitalist [RP]  2310 Troponin I (High Sensitivity)(!): 93 [CC]    Clinical Course User Index [CC] Jerral Meth, MD [RP] Yolande Lamar BROCKS, MD       Lab Orders         Blood culture (routine x 2)         Resp panel by RT-PCR (RSV, Flu A&B, Covid) Anterior Nasal Swab         Comprehensive metabolic panel         Lipase, blood         CBC with Diff         Urinalysis, Routine w reflex microscopic -Urine, Clean Catch         Lactic acid, plasma         POC CBG, ED         I-Stat venous blood gas, ([facility] ED, [facility], [facility])         I-stat chem 8, ED (not at [facility], [facility] or [facility])         I-Stat CG4 Lactic Acid       CXR -  Congestive heart failure superimposed upon chronic pulmonary fibrosis and scarring  CTabd/pelvis -  ileus or partial small bowel obstruction.  in the right retroperitoneum is most  likely related  to resolving hematoma. Infection not excluded.  CTA chest -  no PE, Interstitial lung disease.   Following Medications were ordered in ER: Medications  lactated ringers  infusion ( Intravenous New Bag/Given 07/23/24 1931)  0.9 %  sodium chloride  infusion (250 mLs Intravenous Not Given 07/23/24 2000)  EPINEPHrine  (EPI-PEN) injection 0.3 mg (0.3 mg Intramuscular Given 07/23/24 1715)  lactated ringers  bolus 1,000 mL (0 mLs Intravenous Stopped 07/23/24 2046)  ondansetron  (ZOFRAN ) injection 4 mg (4 mg Intravenous Given 07/23/24 1716)  ceFEPIme  (MAXIPIME ) 2 g in sodium chloride  0.9 % 100 mL IVPB (0 g Intravenous Stopped 07/23/24 1921)  metroNIDAZOLE  (FLAGYL ) IVPB 500 mg (0 mg Intravenous Stopped 07/23/24 2046)  vancomycin  (VANCOREADY) IVPB 2000 mg/400 mL (0 mg Intravenous Stopped 07/23/24 2152)  metoCLOPramide  (REGLAN ) injection 10 mg (10 mg Intravenous Given 07/23/24 1811)  diphenhydrAMINE  (BENADRYL ) injection 12.5 mg (12.5 mg Intravenous Given 07/23/24 1812)  HYDROmorphone  (DILAUDID ) injection 0.5 mg (0.5 mg Intravenous Given 07/23/24 1834)  lactated ringers  bolus 500 mL (0 mLs Intravenous Stopped 07/23/24 2047)  iohexol  (OMNIPAQUE ) 350 MG/ML injection 75 mL (75 mLs Intravenous Contrast Given 07/23/24 1914)  methylPREDNISolone  sodium succinate (SOLU-MEDROL ) 125 mg/2 mL injection 125 mg (125 mg Intravenous Given 07/23/24 1956)  famotidine  (PEPCID ) IVPB 20 mg premix (0 mg Intravenous Stopped 07/23/24 2027)  diphenhydrAMINE  (BENADRYL ) injection 12.5 mg (12.5 mg Intravenous Given 07/23/24 1956)  lactated ringers  bolus 500 mL (0 mLs Intravenous Stopped 07/23/24 2246)    _______________________________________________________ ER Provider Called:     Pulmonology   Dr. Maree  They Recommend admit to medicine     General Surgery - Paola Dreama SAILOR, MD  Will see pt in consult    ED Triage Vitals  Encounter Vitals Group     BP 07/23/24 1652 (!) 76/45     Girls Systolic BP Percentile --      Girls  Diastolic BP Percentile --      Boys Systolic BP Percentile --      Boys Diastolic BP Percentile --      Pulse Rate 07/23/24 1653 100     Resp 07/23/24 1659 (!) 22     Temp 07/23/24 1706 (!) 97.1 F (36.2 C)     Temp Source 07/23/24 1706 Temporal     SpO2 07/23/24 1653 (!) 75 %     Weight --      Height --      Head Circumference --      Peak Flow --      Pain Score 07/23/24 1904 3     Pain Loc --      Pain Education --      Exclude from Growth Chart --   UFJK(75)@     _________________________________________ Significant initial  Findings: Abnormal Labs Reviewed  COMPREHENSIVE METABOLIC PANEL WITH GFR - Abnormal; Notable for the following components:      Result Value   Sodium 134 (*)    Chloride 95 (*)    Glucose, Bld 158 (*)    BUN 53 (*)    Creatinine, Ser 1.67 (*)    Calcium  8.1 (*)    Albumin  3.1 (*)    Total Bilirubin 1.7 (*)    GFR, Estimated 40 (*)    Anion gap 16 (*)    All other components within normal limits  CBC WITH DIFFERENTIAL/PLATELET - Abnormal; Notable for the following components:   WBC 15.1 (*)    Neutro Abs 12.4 (*)    Abs Immature Granulocytes 0.08 (*)  All other components within normal limits  LACTIC ACID, PLASMA - Abnormal; Notable for the following components:   Lactic Acid, Venous 4.1 (*)    All other components within normal limits  LACTIC ACID, PLASMA - Abnormal; Notable for the following components:   Lactic Acid, Venous 7.4 (*)    All other components within normal limits  CBG MONITORING, ED - Abnormal; Notable for the following components:   Glucose-Capillary 160 (*)    All other components within normal limits  I-STAT VENOUS BLOOD GAS, ED - Abnormal; Notable for the following components:   pO2, Ven 28 (*)    Calcium , Ion 1.02 (*)    All other components within normal limits  I-STAT CHEM 8, ED - Abnormal; Notable for the following components:   BUN 76 (*)    Creatinine, Ser 1.70 (*)    Glucose, Bld 153 (*)    Calcium , Ion  1.00 (*)    All other components within normal limits  I-STAT CG4 LACTIC ACID, ED - Abnormal; Notable for the following components:   Lactic Acid, Venous 4.4 (*)    All other components within normal limits  TROPONIN I (HIGH SENSITIVITY) - Abnormal; Notable for the following components:   Troponin I (High Sensitivity) 18 (*)    All other components within normal limits  TROPONIN I (HIGH SENSITIVITY) - Abnormal; Notable for the following components:   Troponin I (High Sensitivity) 93 (*)    All other components within normal limits      _________________________ Troponin  ordered Cardiac Panel (last 3 results) Recent Labs    07/23/24 1731 07/23/24 1938  TROPONINIHS 18* 93*     ECG: Ordered Personally reviewed and interpreted by me showing: HR : 100 Rhythm: Sinus tachycardia Consider right atrial enlargement Nonspecific intraventricular conduction delay QTC 549  BNP (last 3 results) Recent Labs    12/06/23 2310 04/09/24 1909 04/11/24 2159  BNP 480.6* 566.3* 1,178.8*       The recent clinical data is shown below. Vitals:   07/23/24 2145 07/23/24 2215 07/23/24 2230 07/23/24 2245  BP: (!) 113/52 (!) 97/55 (!) 106/52 (!) 100/55  Pulse: 72 72 67 65  Resp: 17 19 16 18   Temp:    98.4 F (36.9 C)  TempSrc:      SpO2: 100% 100% 100% 100%      WBC     Component Value Date/Time   WBC 15.1 (H) 07/23/2024 1731   LYMPHSABS 1.8 07/23/2024 1731   LYMPHSABS 2.5 06/30/2020 1545   MONOABS 0.7 07/23/2024 1731   EOSABS 0.1 07/23/2024 1731   EOSABS 0.5 (H) 06/30/2020 1545   BASOSABS 0.0 07/23/2024 1731   BASOSABS 0.1 06/30/2020 1545     Lactic Acid, Venous    Component Value Date/Time   LATICACIDVEN 4.4 (HH) 07/23/2024 2206     Lactic Acid, Venous    Component Value Date/Time   LATICACIDVEN 4.4 (HH) 07/23/2024 2206    Procalcitonin   Ordered      UA   ordered    Results for orders placed or performed during the hospital encounter of 07/23/24  Resp panel by  RT-PCR (RSV, Flu A&B, Covid)     Status: None   Collection Time: 07/23/24  5:07 PM   Specimen: Nasal Swab  Result Value Ref Range Status   SARS Coronavirus 2 by RT PCR NEGATIVE NEGATIVE Final   Influenza A by PCR NEGATIVE NEGATIVE Final   Influenza B by PCR NEGATIVE NEGATIVE Final  Comment: (NOTE) The Xpert Xpress SARS-CoV-2/FLU/RSV plus assay is intended as an aid in the diagnosis of influenza from Nasopharyngeal swab specimens and should not be used as a sole basis for treatment. Nasal washings and aspirates are unacceptable for Xpert Xpress SARS-CoV-2/FLU/RSV testing.  Fact Sheet for Patients: BloggerCourse.com  Fact Sheet for Healthcare Providers: SeriousBroker.it  This test is not yet approved or cleared by the [place]  FDA and has been authorized for detection and/or diagnosis of SARS-CoV-2 by FDA under an Emergency Use Authorization (EUA). This EUA will remain in effect (meaning this test can be used) for the duration of the COVID-19 declaration under Section 564(b)(1) of the Act, 21 U.S.C. section 360bbb-3(b)(1), unless the authorization is terminated or revoked.     Resp Syncytial Virus by PCR NEGATIVE NEGATIVE Final    Comment: (NOTE) Fact Sheet for Patients: BloggerCourse.com  Fact Sheet for Healthcare Providers: SeriousBroker.it  This test is not yet approved or cleared by the [place]  FDA and has been authorized for detection and/or diagnosis of SARS-CoV-2 by FDA under an Emergency Use Authorization (EUA). This EUA will remain in effect (meaning this test can be used) for the duration of the COVID-19 declaration under Section 564(b)(1) of the Act, 21 U.S.C. section 360bbb-3(b)(1), unless the authorization is terminated or revoked.  Performed at [facility], [address], [place], [place] [address]    *Note: Due to a large number of  results and/or encounters for the requested time period, some results have not been displayed. A complete set of results can be found in Results Review.    ABX started Antibiotics Given (last 72 hours)     Date/Time Action Medication Dose Rate   07/23/24 1812 New Bag/Given   ceFEPIme  (MAXIPIME ) 2 g in sodium chloride  0.9 % 100 mL IVPB 2 g 200 mL/hr   07/23/24 1933 New Bag/Given   vancomycin  (VANCOREADY) IVPB 2000 mg/400 mL 2,000 mg 200 mL/hr   07/23/24 1936 New Bag/Given   metroNIDAZOLE  (FLAGYL ) IVPB 500 mg 500 mg 100 mL/hr        No results found for the last 90 days.    ________________________________________________________________  Arterial ***Venous  Blood Gas result:  pH *** pCO2 ***; pO2 ***;     %O2 Sat ***.  ABG    Component Value Date/Time   PHART 7.362 12/27/2016 0334   PCO2ART 45.4 12/27/2016 0334   PO2ART 115 (H) 12/27/2016 0334   HCO3 28.0 07/23/2024 1737   TCO2 28 07/23/2024 1831   ACIDBASEDEF TEST WILL BE CREDITED 12/19/2016 1333   O2SAT 46 07/23/2024 1737       __________________________________________________________ Recent Labs  Lab 07/23/24 1731 07/23/24 1737 07/23/24 1831  NA 134* 137 136  K 4.9 4.8 4.7  CO2 23  --   --   GLUCOSE 158*  --  153*  BUN 53*  --  76*  CREATININE 1.67*  --  1.70*  CALCIUM  8.1*  --   --     Cr  * stable,  Up from baseline see below Lab Results  Component Value Date   CREATININE 1.70 (H) 07/23/2024   CREATININE 1.67 (H) 07/23/2024   CREATININE 1.60 (H) 04/14/2024    Recent Labs  Lab 07/23/24 1731  AST 39  ALT 11  ALKPHOS 98  BILITOT 1.7*  PROT 6.9  ALBUMIN  3.1*   Lab Results  Component Value Date   CALCIUM  8.1 (L) 07/23/2024   PHOS 4.4 12/27/2023  Plt: Lab Results  Component Value Date   PLT 245 07/23/2024         Recent Labs  Lab 07/23/24 1731 07/23/24 1737 07/23/24 1831  WBC 15.1*  --   --   NEUTROABS 12.4*  --   --   HGB 14.7 15.0 15.6  HCT 44.8 44.0 46.0  MCV 93.3   --   --   PLT 245  --   --     HG/HCT * stable,  Down *Up from baseline see below    Component Value Date/Time   HGB 15.6 07/23/2024 1831   HGB 14.3 06/30/2020 1545   HCT 46.0 07/23/2024 1831   HCT 40.5 06/30/2020 1545   MCV 93.3 07/23/2024 1731   MCV 87 06/30/2020 1545      Recent Labs  Lab 07/23/24 1731  LIPASE 33   No results for input(s): AMMONIA in the last 168 hours.    _______________________________________________ Hospitalist was called for admission for *** Anaphylaxis, initial encounter ***  Shock (HCC) ***  Nausea and vomiting, unspecified vomiting type ***  Partial small bowel obstruction (HCC) ***    The following Work up has been ordered so far:  Orders Placed This Encounter  Procedures  . Blood culture (routine x 2)  . Resp panel by RT-PCR (RSV, Flu A&B, Covid) Anterior Nasal Swab  . DG Chest Portable 1 View  . CT ABDOMEN PELVIS W CONTRAST  . CT Angio Chest PE W and/or Wo Contrast  . Comprehensive metabolic panel  . Lipase, blood  . CBC with Diff  . Urinalysis, Routine w reflex microscopic -Urine, Clean Catch  . Lactic acid, plasma  . Diet NPO time specified  . Initiate Carrier Fluid Protocol  . Document height and weight  . Assess and Document Glasgow Coma Scale  . Document vital signs within 1-hour of fluid bolus completion. Notify provider of abnormal vital signs despite fluid resuscitation.  . DO NOT delay antibiotics if unable to obtain blood culture.  . Refer to Sidebar Report: Sepsis Sidebar ED/IP  . Notify provider for difficulties obtaining IV access.  . Insert peripheral IV x 2  . Vasopressor infusion via peripheral IV access  . Assess IV site every 2 hours for vasopressor extravasation  . IV site should be re-confirmed by blood return every 24 hours of vasopressor administration  . Peripheral administration of vasopressors should not exceed 72 hours  . Full code  . Code Sepsis activation.  This occurs automatically when  order is signed and prioritizes pharmacy, lab, and radiology services for STAT collections and interventions.  If [facility] downtime, call [facility] [phone]) to activate Code Sepsis.  Shane monitoring by pharmacy  . pharmacy consult  . Consult to general surgery  sbo  . Consult to intensivist  . Consult to hospitalist  . Oxygen therapy Mode or (Route): Nasal cannula  . POC CBG, ED  . I-Stat venous blood gas, ([facility], [facility], [facility])  . I-stat chem 8, ED (not at [facility], [facility] or [facility])  . I-Stat CG4 Lactic Acid  . EKG 12-Lead  . Insert peripheral IV     OTHER Significant initial  Findings:  labs showing:     DM  labs:  HbA1C: Recent Labs    12/06/23 2311 04/09/24 2338  HGBA1C 8.5* 8.7*       CBG (last 3)  Recent Labs    07/23/24 1937  GLUCAP 160*          Cultures:    Component  Value Date/Time   SDES BLOOD RIGHT HAND 04/09/2024 2340   SPECREQUEST  04/09/2024 2340    BOTTLES DRAWN AEROBIC AND ANAEROBIC Blood Culture adequate volume   CULT  04/09/2024 2340    NO GROWTH 5 DAYS Performed at [facility], [address], [place], [place] [address]    REPTSTATUS 04/15/2024 FINAL 04/09/2024 2340     Radiological Exams on Admission: CT Angio Chest PE W and/or Wo Contrast Result Date: 07/23/2024 CLINICAL DATA:  Chest pain and shortness of breath. Concern for pulmonary disease. EXAM: CT ANGIOGRAPHY CHEST WITH CONTRAST TECHNIQUE: Multidetector CT imaging of the chest was performed using the standard protocol during bolus administration of intravenous contrast. Multiplanar CT image reconstructions and MIPs were obtained to evaluate the vascular anatomy. RADIATION DOSE REDUCTION: This exam was performed according to the departmental dose-optimization program which includes automated exposure control, adjustment of the mA and/or kV according to patient size and/or use of iterative reconstruction technique. CONTRAST:  75mL OMNIPAQUE  IOHEXOL  350 MG/ML SOLN COMPARISON:  Chest CT dated  05/31/2024. FINDINGS: Evaluation of this exam is limited due to respiratory motion. Cardiovascular: There is mild cardiomegaly. No pericardial effusion. There is coronary vascular calcification. Aortic valve repair. Mild atherosclerotic calcification of the thoracic aorta. No aneurysmal dilatation. Evaluation of the pulmonary arteries is limited due to respiratory motion as well as streak artifact caused by patient's arms. No porta artery embolus identified. Mediastinum/Nodes: Mildly enlarged right hilar lymph nodes measure 11 mm. The esophagus is grossly unremarkable no mediastinal fluid collection. Lungs/Pleura: Diffuse interstitial coarsening and fibrosis in keeping with interstitial lung disease. Small left pleural effusion. No focal consolidation or pneumothorax. The central airways are patent. Upper Abdomen: No acute abnormality. Musculoskeletal: Osteopenia with degenerative changes. Median sternotomy wires. No acute osseous pathology. Review of the MIP images confirms the above findings. IMPRESSION: 1. No CT evidence of pulmonary artery embolus. 2. Interstitial lung disease. 3. Small left pleural effusion. 4.  Aortic Atherosclerosis (ICD10-I70.0). Electronically Signed   By: Shane [name] M.D.   On: 07/23/2024 19:31   CT ABDOMEN PELVIS W CONTRAST Result Date: 07/23/2024 CLINICAL DATA:  Nausea and vomiting EXAM: CT ABDOMEN AND PELVIS WITH CONTRAST TECHNIQUE: Multidetector CT imaging of the abdomen and pelvis was performed using the standard protocol following bolus administration of intravenous contrast. RADIATION DOSE REDUCTION: This exam was performed according to the departmental dose-optimization program which includes automated exposure control, adjustment of the mA and/or kV according to patient size and/or use of iterative reconstruction technique. CONTRAST:  75mL OMNIPAQUE  IOHEXOL  350 MG/ML SOLN COMPARISON:  CT abdomen and pelvis 12/30/2023. FINDINGS: Lower chest: Chronic interstitial changes are  again noted in the lung bases. The heart is enlarged. Hepatobiliary: No focal liver abnormality is seen. No gallstones, gallbladder wall thickening, or biliary dilatation. Pancreas: Unremarkable. No pancreatic ductal dilatation or surrounding inflammatory changes. Spleen: Normal in size without focal abnormality. Adrenals/Urinary Tract: There is no hydronephrosis or perinephric stranding. No perinephric fluid collection identified. Knee adrenal glands and bladder are within normal limits. Stomach/Bowel: There are mildly dilated central small bowel loops. No wall thickening or inflammation. No transition point visualized. The colon is nondilated. There is diffuse colonic diverticulosis. The appendix is normal. Stomach is within normal limits. Vascular/Lymphatic: Aortic atherosclerosis. No enlarged abdominal or pelvic lymph nodes. Reproductive: Prostate is unremarkable. Other: There is no free air. There is trace free fluid in the left upper quadrant previously identified right retroperitoneal hematoma has significantly decreased in size. Enhancing low-attenuation areas seen in the right  retroperitoneum measuring 9.1 x 2.7 x 5.3 cm. There is no focal abdominal wall hernia. Musculoskeletal: Degenerative changes affect the spine the hips. There are laminectomy defects at L3-L4. IMPRESSION: 1. Mildly dilated central small bowel loops without transition point. Findings may represent ileus or partial small bowel obstruction. 2. Trace free fluid in the left upper quadrant. 3. Enhancing low-attenuation collection in the right retroperitoneum is most likely related to resolving hematoma. Infection not excluded. 4. Colonic diverticulosis. Aortic Atherosclerosis (ICD10-I70.0). Electronically Signed   By: Shane [name] M.D.   On: 07/23/2024 19:30   DG Chest Portable 1 View Result Date: 07/23/2024 CLINICAL DATA:  Short of breath, vomiting EXAM: PORTABLE CHEST 1 VIEW COMPARISON:  04/11/2024 FINDINGS: Two frontal views of the  chest demonstrate stable postsurgical changes from CABG and aortic valve replacement. Cardiac silhouette remains enlarged. Chronic background pulmonary scarring and fibrosis, with increased pulmonary vascular congestion and patchy bibasilar consolidation consistent with superimposed edema. Small bilateral effusions. No pneumothorax. IMPRESSION: 1. Congestive heart failure superimposed upon chronic pulmonary fibrosis and scarring. Electronically Signed   By: Ozell Daring M.D.   On: 07/23/2024 18:24   _______________________________________________________________________________________________________ Latest  Blood pressure (!) 100/55, pulse 65, temperature 98.4 F (36.9 C), resp. rate 18, SpO2 100%.   Vitals  labs and radiology finding personally reviewed  Review of Systems:    Pertinent positives include: ***  Constitutional:  No weight loss, night sweats, Fevers, chills, fatigue, weight loss  HEENT:  No headaches, Difficulty swallowing,Tooth/dental problems,Sore throat,  No sneezing, itching, ear ache, nasal congestion, post nasal drip,  Cardio-vascular:  No chest pain, Orthopnea, PND, anasarca, dizziness, palpitations.no Bilateral lower extremity swelling  GI:  No heartburn, indigestion, abdominal pain, nausea, vomiting, diarrhea, change in bowel habits, loss of appetite, melena, blood in stool, hematemesis Resp:  no shortness of breath at rest. No dyspnea on exertion, No excess mucus, no productive cough, No non-productive cough, No coughing up of blood.No change in color of mucus.No wheezing. Skin:  no rash or lesions. No jaundice GU:  no dysuria, change in color of urine, no urgency or frequency. No straining to urinate.  No flank pain.  Musculoskeletal:  No joint pain or no joint swelling. No decreased range of motion. No back pain.  Psych:  No change in mood or affect. No depression or anxiety. No memory loss.  Neuro: no localizing neurological complaints, no tingling, no  weakness, no double vision, no gait abnormality, no slurred speech, no confusion  All systems reviewed and apart from HOPI all are negative _______________________________________________________________________________________________ Past Medical History:   Past Medical History:  Diagnosis Date  . Acute blood loss anemia   . Anxiety   . 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 Cataract   . Chronic kidney disease    d/t DM  . CKD (chronic kidney disease), stage III (HCC)   . COVID-19 virus infection 07/18/2020   Last Assessment & Plan:   Formatting of this note might be different from the original.  Immunocompromise high risk patient  -ID consulted for further therapies and will administer regeneron as OP tomorrow   -Decadron  discontinued as patient is off O2    . Depression   . Diabetes mellitus    Vgo disposal insulin  bolus  simular to insulin  pump  . Dyspnea   . GERD (gastroesophageal reflux disease)   . History of kidney stones    passed  . Hyperlipidemia   . Hypertension   . 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 ILD (  interstitial lung disease) (HCC)   . Moderate aortic stenosis    a. 10/2019 Echo: EF 55-60%, Gr2 DD. Nl RV.   Shane Neuromuscular disorder (HCC)   . Neuropathy associated with endocrine disorder (HCC)   . Nonobstructive CAD (coronary artery disease)    a. 2012 Cath: mod, nonobs dzs; b. 10/2016 MV: EF 60%, no ischemia.  . OSA on CPAP 05/05/2017   Unattended Home Sleep Test 7/2/813-AHI 38.6/hour, desaturation to 64%, body weight 261 pounds  . PONV (postoperative nausea and vomiting)   . Postoperative anemia due to acute blood loss 11/07/2020  . Postoperative hemorrhagic shock 03/20/2021  . Sleep apnea     uses cpap asked to bring mask and tubing      Past Surgical History:  Procedure Laterality Date  . ABDOMINAL AORTOGRAM W/LOWER EXTREMITY N/A 12/10/2020   Procedure: ABDOMINAL AORTOGRAM W/LOWER EXTREMITY;  Surgeon: Magda Debby SAILOR, MD;  Location: [facility] INVASIVE CV LAB;   Service: Cardiovascular;  Laterality: N/A;  . AMPUTATION Right 01/22/2021   Procedure: 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;  Surgeon: Harden Jerona GAILS, MD;  Location: [facility] OR;  Service: Orthopedics;  Laterality: Right;  . AMPUTATION Right 03/17/2021   Procedure: RIGHT BELOW KNEE AMPUTATION;  Surgeon: Harden Jerona GAILS, MD;  Location: [facility] OR;  Service: Orthopedics;  Laterality: Right;  . ANKLE FUSION Right 01/22/2021   Procedure: RIGHT FOOT TIBIOCALCANEAL FUSION;  Surgeon: Harden Jerona GAILS, MD;  Location: [facility] OR;  Service: Orthopedics;  Laterality: Right;  . ANTERIOR FUSION CERVICAL SPINE  2012  . CARDIAC CATHETERIZATION  2011  . CARDIAC CATHETERIZATION N/A 11/09/2016   Procedure: Right Heart Cath;  Surgeon: Victory LELON Sharps, MD;  Location: [facility] INVASIVE CV LAB;  Service: Cardiovascular;  Laterality: N/A;  . carpel tunnel     left wrist  . CATARACT EXTRACTION    . CATARACT EXTRACTION W/ INTRAOCULAR LENS  IMPLANT, BILATERAL  2013  . CERVICAL LAMINECTOMY  2012  . COLONOSCOPY N/A 01/14/2013   Procedure: COLONOSCOPY;  Surgeon: Norleen SAILOR Kiang, MD;  Location: [facility] ENDOSCOPY;  Service: Endoscopy;  Laterality: N/A;  . CORONARY ARTERY BYPASS GRAFT  11/04/2020   LIMA-LAD, SVG-OM1, SVG-PDA (Dr Norleen Exon [facility]) dc 11/18/2020  . EYE SURGERY    . I & D EXTREMITY Right 02/19/2021   Procedure: RIGHT ANKLE DEBRIDEMENT AND PLACEMENT ANTIBIOTIC BEADS;  Surgeon: Harden Jerona GAILS, MD;  Location: [facility] OR;  Service: Orthopedics;  Laterality: Right;  . KNEE SURGERY  1998   left  . LEFT HEART CATH AND CORONARY ANGIOGRAPHY N/A 07/10/2020   Procedure: LEFT HEART CATH AND CORONARY ANGIOGRAPHY;  Surgeon: Wonda Sharper, MD;  Location: [facility] INVASIVE CV LAB;  Service: Cardiovascular;  Laterality: N/A;  . LUMBAR LAMINECTOMY  2003  . LUNG BIOPSY Left 12/26/2016   Procedure: LUNG BIOPSY;  Surgeon: Elspeth JAYSON Millers, MD;  Location: [facility] OR;  Service: Thoracic;  Laterality: Left;  . PACEMAKER IMPLANT N/A 03/30/2020   Procedure: PACEMAKER IMPLANT;  Surgeon: Waddell Danelle LELON, MD;  Location: [facility] INVASIVE CV LAB;  Service: Cardiovascular;  Laterality: N/A;  . PERIPHERAL VASCULAR INTERVENTION Right 12/10/2020   Procedure: PERIPHERAL VASCULAR INTERVENTION;  Surgeon: Magda Debby SAILOR, MD;  Location: [facility] INVASIVE CV LAB;  Service: Cardiovascular;  Laterality: Right;  SFA  . POSTERIOR FUSION CERVICAL SPINE  2012  . PPM GENERATOR REMOVAL N/A 12/14/2020   Procedure: PPM GENERATOR REMOVAL;  Surgeon: Waddell Danelle LELON, MD;  Location: [facility] INVASIVE CV LAB;  Service: Cardiovascular;  Laterality: N/A;  . TEE WITHOUT CARDIOVERSION  N/A 12/11/2020   Procedure: TRANSESOPHAGEAL ECHOCARDIOGRAM (TEE);  Surgeon: Barbaraann Darryle Ned, MD;  Location: [facility] ENDOSCOPY;  Service: Cardiovascular;  Laterality: N/A;  . TRIGGER FINGER RELEASE  2011   4th finger left hand  . VIDEO ASSISTED THORACOSCOPY Left 12/26/2016   Procedure: VIDEO ASSISTED THORACOSCOPY;  Surgeon: Elspeth JAYSON Millers, MD;  Location: [facility] OR;  Service: Thoracic;  Laterality: Left;  Shane VIDEO BRONCHOSCOPY N/A 12/26/2016   Procedure: VIDEO BRONCHOSCOPY;  Surgeon: Elspeth JAYSON Millers, MD;  Location: [facility] OR;  Service: Thoracic;  Laterality: N/A;    Social History:  Ambulatory *** independently cane, walker  wheelchair bound, bed bound     reports that he has never smoked. He has been exposed to tobacco smoke. He has never used smokeless tobacco. He reports that he does not drink alcohol and does not use drugs.     Family History: *** Family History  Problem Relation Age of Onset  . Diabetes Mellitus II Mother   . Emphysema Father 64  . Heart attack Father   . Colon cancer Neg Hx   . Esophageal cancer Neg Hx   . Rectal cancer Neg Hx   . Stomach cancer Neg Hx    ______________________________________________________________________________________________ Allergies: Allergies  Allergen Reactions  . Codeine Hives and Itching  . Ofev  [Nintedanib ] Diarrhea    SEVERE DIARRHEA  . Pirfenidone  Diarrhea and Other (See Comments)     Esbriet  (Pirfenidone ) causes elevated LFTs. D/C on 06/14/17 and SEVERE DIARRHEA   . Amoxicillin Itching     Prior to Admission medications   Medication Sig Start Date End Date Taking? Authorizing Provider  albuterol  (PROVENTIL ) (2.5 MG/3ML) 0.083% nebulizer solution Take 2.5 mg by nebulization 2 (two) times daily as needed for wheezing or shortness of breath.   Yes [provider]  albuterol  (VENTOLIN  HFA) 108 (90 Base) MCG/ACT inhaler Inhale 2 puffs into the lungs every 4 (four) hours as needed for wheezing or shortness of breath. Patient taking differently: Inhale 3 puffs into the lungs every 4 (four) hours as needed for wheezing or shortness of breath. 04/12/21  Yes Angiulli, Daniel J, PA-C  Acetaminophen  (TYLENOL  PO) Take 2 tablets by mouth as needed. May take 2 tablet by mouth 3 hours after taking the trazodone  75mg  prn.    [provider]  Ascorbic Acid  (VITAMIN C  WITH ROSE HIPS) 500 MG tablet Take 1 tablet (500 mg total) by mouth daily. Patient taking differently: Take 500 mg by mouth in the morning. 04/12/21   Angiulli, Toribio PARAS, PA-C  atorvastatin  (LIPITOR ) 80 MG tablet TAKE 1 TABLET BY MOUTH EVERY DAY Patient taking differently: Take 80 mg by mouth at bedtime. 07/25/22   Pietro Redell RAMAN, MD  benzonatate  (TESSALON ) 100 MG capsule Take 1 capsule (100 mg total) by mouth 3 (three) times daily. Patient taking differently: Take 100 mg by mouth as needed for cough. 12/29/23   Tobie Yetta HERO, MD  cyanocobalamin  1000 MCG tablet Take 1 tablet (1,000 mcg total) by mouth daily. Patient taking differently: Take 1,000 mcg by mouth in the morning. 12/30/23   Tobie Yetta HERO, MD  dextromethorphan  (DELSYM ) 30 MG/5ML liquid Take 2.5 mLs (15 mg total) by mouth 2 (two) times daily. Patient taking differently: Take 15 mg by mouth at bedtime. 12/29/23   Tobie Yetta HERO, MD  docusate sodium  (COLACE) 100 MG capsule Take 1 capsule (100 mg total) by mouth 2 (two) times daily. Patient taking  differently: Take 100 mg by mouth as needed for mild constipation or  moderate constipation. 12/29/23   Tobie Yetta HERO, MD  escitalopram  (LEXAPRO ) 10 MG tablet Take 1 tablet (10 mg total) by mouth daily. Patient taking differently: Take 10 mg by mouth in the morning. 04/12/21   Angiulli, Toribio PARAS, PA-C  furosemide  (LASIX ) 40 MG tablet Take 1 tablet (40 mg total) by mouth 2 (two) times daily. 04/14/24 04/14/25  Fairy Frames, MD  gabapentin  (NEURONTIN ) 100 MG capsule Take 1 capsule (100 mg total) by mouth 3 (three) times daily. 12/29/23   Tobie Yetta HERO, MD  HUMALOG  KWIKPEN 100 UNIT/ML KwikPen Inject 10-15 Units into the skin 3 (three) times daily. Can take 15-18 Units with meals while on Prednisone  for 3days 04/14/24   Fairy Frames, MD  HYDROcodone  bit-homatropine [facility]) 5-1.5 MG/5ML syrup Take 5 mLs by mouth every 6 (six) hours as needed for cough. 12/29/23   Tobie Yetta HERO, MD  HYDROcodone -acetaminophen  (NORCO/VICODIN) 5-325 MG tablet Take 1 tablet by mouth at bedtime as needed.    [provider]  hydrOXYzine  (ATARAX ) 25 MG tablet Take 25 mg by mouth. 03/06/24   [provider]  lisinopril  (ZESTRIL ) 10 MG tablet Take 10 mg by mouth daily. 05/08/24   [provider]  LORazepam  (ATIVAN ) 1 MG tablet Take 1 tablet (1 mg total) by mouth at bedtime as needed for sleep. 12/29/23   Tobie Yetta HERO, MD  Magnesium  200 MG TABS 1 tablet at bedtime Orally Once a day 03/06/24   [provider]  methocarbamol  (ROBAXIN ) 500 MG tablet Take 1 tablet (500 mg total) by mouth every 8 (eight) hours as needed for muscle spasms. 12/29/23   Tobie Yetta HERO, MD  Multiple Vitamin (MULTIVITAMIN WITH MINERALS) TABS tablet Take 1 tablet by mouth in the morning.    [provider]  mupirocin  ointment (BACTROBAN ) 2 % Apply 1 Application topically at bedtime. 06/06/23   [provider]  Nutritional Supplements (FEEDING SUPPLEMENT, NEPRO CARB STEADY,) LIQD Take 237 mLs by mouth 2 (two)  times daily between meals. 12/29/23   Patel, Pranav M, MD  Olopatadine HCl (PATADAY OP) Place 2 drops into both eyes daily as needed (allergies).    [provider]  ondansetron  (ZOFRAN ) 4 MG tablet Take 4 mg by mouth as needed for nausea or vomiting. 06/15/23   [provider]  OXYGEN Inhale 3 L into the lungs See admin instructions. Inhale 3L of Oxygen everynight at bedside. May uses throughout the day as needed.    [provider]  pantoprazole  (PROTONIX ) 40 MG tablet Take 1 tablet (40 mg total) by mouth 2 (two) times daily. 04/12/21   Angiulli, Daniel J, PA-C  polyethylene glycol powder (GLYCOLAX /MIRALAX ) 17 GM/SCOOP powder Take 17 g by mouth as needed for mild constipation or moderate constipation. 11/15/20   [provider]  traZODone  (DESYREL ) 50 MG tablet Take 75 mg by mouth at bedtime as needed for sleep. 05/11/23   [provider]  TRESIBA  FLEXTOUCH 100 UNIT/ML FlexTouch Pen Inject 25 Units into the skin in the morning. 04/14/24   Fairy Frames, MD  zinc  sulfate 220 (50 Zn) MG capsule Take 1 capsule (220 mg total) by mouth daily. Patient taking differently: Take 220 mg by mouth in the morning. 04/12/21   Pegge Toribio PARAS, PA-C    ___________________________________________________________________________________________________ Physical Exam:    07/23/2024   10:45 PM 07/23/2024   10:30 PM 07/23/2024   10:15 PM  Vitals with BMI  Systolic 100 106 97  Diastolic 55 52 55  Pulse 65 67  72     1. General:  in No ***Acute distress***increased work of breathing ***complaining of severe pain****agitated * Chronically ill *well *cachectic *toxic acutely ill -appearing 2. Psychological: Alert and *** Oriented 3. Head/ENT:   Moist *** Dry Mucous Membranes                          Head Non traumatic, neck supple                          Normal *** Poor Dentition 4. SKIN: normal *** decreased Skin turgor,  Skin clean Dry and intact no rash    5.  Heart: Regular rate and rhythm no*** Murmur, no Rub or gallop 6. Lungs: ***Clear to auscultation bilaterally, no wheezes or crackles   7. Abdomen: Soft, ***non-tender, Non distended *** obese ***bowel sounds present 8. Lower extremities: no clubbing, cyanosis, no ***edema 9. Neurologically Grossly intact, moving all 4 extremities equally *** strength 5 out of 5 in all 4 extremities cranial nerves II through XII intact 10. MSK: Normal range of motion    Chart has been reviewed  ______________________________________________________________________________________________  Assessment/Plan  ***  Admitted for *** Anaphylaxis, initial encounter ***  Shock (HCC) ***  Nausea and vomiting, unspecified vomiting type ***  Partial small bowel obstruction (HCC) ***    Present on Admission: **None**     No problem-specific Assessment & Plan notes found for this encounter.    Other plan as per orders.  DVT prophylaxis:  SCD *** Lovenox        Code Status:    Code Status: Full Code FULL CODE *** DNR/DNI ***comfort care as per patient ***family  I had personally discussed CODE STATUS with patient and family*  ACP *** none has been reviewed ***   Family Communication:   Family not at  Bedside  plan of care was discussed on the phone with *** Son, Daughter, Wife, Husband, Sister, Brother , father, mother  Diet  Diet Orders (From admission, onward)     Start     Ordered   07/23/24 1707  Diet NPO time specified  (Septic presentation on arrival (screening labs, nursing and treatment orders for obvious sepsis))  Diet effective now        07/23/24 1707            Disposition Plan:   *** likely will need placement for rehabilitation                          Back to current facility when stable                            To home once workup is complete and patient is stable  ***Following barriers for discharge:                             Chest pain *** Stroke *** Syncope  ***work up is complete                            Electrolytes corrected                               Anemia corrected h/H stable  Pain controlled with PO medications                               Afebrile, white count improving able to transition to PO antibiotics                             Will need to be able to tolerate PO                            Will likely need home health, home O2, set up                           Will need consultants to evaluate patient prior to discharge                           Work of breathing improves       Consult Orders  (From admission, onward)           Start     Ordered   07/23/24 2217  Consult to hospitalist  Pg by Elspeth  Once       Provider:  (Not yet assigned)  Question Answer Comment  Place call to: [facility]   Reason for Consult Admit      07/23/24 2216                              ***Would benefit from PT/OT eval prior to DC  Ordered                   Swallow eval - SLP ordered                   Diabetes care coordinator                   Transition of care consulted                   Nutrition    consulted                  Wound care  consulted                   Palliative care    consulted                   Behavioral health  consulted                    Consults called: ***  NONE   Admission status:  ED Disposition     ED Disposition  Admit   Condition  --   Comment  The patient appears reasonably stabilized for admission considering the current resources, flow, and capabilities available in the ED at this time, and I doubt any other [facility] requiring further screening and/or treatment in the ED prior to admission is  present.           Obs***  ***  inpatient     I Expect 2 midnight stay secondary to severity of patient's current illness need for inpatient interventions justified by the following: ***hemodynamic instability despite optimal treatment (tachycardia  *hypotension * tachypnea *hypoxia, hypercapnia) *** Severe lab/radiological/exam abnormalities including:  Anaphylaxis, initial encounter ***  Shock (HCC) ***  Nausea and vomiting, unspecified vomiting type ***  Partial small bowel obstruction (HCC) ***  and extensive comorbidities including: *substance abuse  *Chronic pain *DM2  * CHF * CAD  * COPD/asthma *Morbid Obesity * CKD *dementia *liver disease *history of stroke with residual deficits *  malignancy, * sickle cell disease  History of amputation Chronic anticoagulation  That are currently affecting medical management.   I expect  patient to be hospitalized for 2 midnights requiring inpatient medical care.  Patient is at high risk for adverse outcome (such as loss of life or disability) if not treated.  Indication for inpatient stay as follows:  Severe change from baseline regarding mental status Hemodynamic instability despite maximal medical therapy,  severe pain requiring acute inpatient management,  inability to maintain oral hydration   persistent chest pain despite medical management Need for operative/procedural  intervention New or worsening hypoxia ongoing suicidal ideations   Need for IV antibiotics, IV fluids,, IV pain medications, IV anticoagulation,  IV rate controling medications, IV antihypertensives need for biPAP Frequent labs    Level of care   *** tele  For 12H 24H     medical floor       progressive     stepdown   tele indefinitely please discontinue once patient no longer qualifies COVID-19 Labs    Critical***  Patient is critically ill due to  hemodynamic instability * respiratory failure *severe sepsis* ongoing chest pain*  They are at high risk for life/limb threatening clinical deterioration requiring frequent reassessment and modifications of care.  Services provided include examination of the patient, review of relevant ancillary tests, prescription of lifesaving therapies,  review of medications and prophylactic therapy.  Total critical care time excluding separately billable procedures: 60*  Minutes.    Shane [name] 07/23/2024, 10:58 PM ***  [facility]     after 2 AM please page floor coverage   If 7AM-7PM, please contact the day team taking care of the patient using [email or web address]

## 2024-07-23 NOTE — Progress Notes (Signed)
Sbo

## 2024-07-23 NOTE — ED Triage Notes (Signed)
 Patient BIB [facility] from home for vomiting with increased shob d/t hx of copd and pulmonary fibrosis. Patient becomes hypoxic during extended episodes of vomiting. Patient initial BP 82/44 O2 78% RA, RA 80s but paced. CBG 162 BP 95/palp

## 2024-07-23 NOTE — Sepsis Progress Note (Addendum)
 Notified Provider of need to order 2nd lactic acid.   Bedside RN aware of need to draw 2nd LA also.

## 2024-07-23 NOTE — Subjective & Objective (Signed)
 Coming from home with SOb cOpd/pulmonary fibrosis. He had a rush Initial bp was in 70's Imaging worrisome for SBO Hx of retroperitoneal hematoma that seems unchanged, Seen in January Hx of adrenal insufficiency Initially started on ABx General Surgery will see in consult

## 2024-07-24 ENCOUNTER — Inpatient Hospital Stay (HOSPITAL_COMMUNITY)

## 2024-07-24 ENCOUNTER — Other Ambulatory Visit: Payer: Self-pay

## 2024-07-24 DIAGNOSIS — I5021 Acute systolic (congestive) heart failure: Secondary | ICD-10-CM | POA: Diagnosis not present

## 2024-07-24 DIAGNOSIS — T782XXA Anaphylactic shock, unspecified, initial encounter: Secondary | ICD-10-CM | POA: Diagnosis present

## 2024-07-24 DIAGNOSIS — Z86711 Personal history of pulmonary embolism: Secondary | ICD-10-CM | POA: Diagnosis present

## 2024-07-24 DIAGNOSIS — I4510 Unspecified right bundle-branch block: Secondary | ICD-10-CM

## 2024-07-24 DIAGNOSIS — R7989 Other specified abnormal findings of blood chemistry: Secondary | ICD-10-CM

## 2024-07-24 DIAGNOSIS — I5082 Biventricular heart failure: Secondary | ICD-10-CM

## 2024-07-24 DIAGNOSIS — I214 Non-ST elevation (NSTEMI) myocardial infarction: Secondary | ICD-10-CM

## 2024-07-24 DIAGNOSIS — I959 Hypotension, unspecified: Secondary | ICD-10-CM

## 2024-07-24 DIAGNOSIS — K56609 Unspecified intestinal obstruction, unspecified as to partial versus complete obstruction: Secondary | ICD-10-CM | POA: Diagnosis not present

## 2024-07-24 DIAGNOSIS — I5020 Unspecified systolic (congestive) heart failure: Secondary | ICD-10-CM

## 2024-07-24 LAB — URINALYSIS, COMPLETE (UACMP) WITH MICROSCOPIC
Bacteria, UA: NONE SEEN
Bilirubin Urine: NEGATIVE
Glucose, UA: NEGATIVE mg/dL
Hgb urine dipstick: NEGATIVE
Ketones, ur: NEGATIVE mg/dL
Leukocytes,Ua: NEGATIVE
Nitrite: NEGATIVE
Protein, ur: NEGATIVE mg/dL
Specific Gravity, Urine: 1.031 — ABNORMAL HIGH (ref 1.005–1.030)
pH: 5 (ref 5.0–8.0)

## 2024-07-24 LAB — ECHOCARDIOGRAM COMPLETE
AR max vel: 2.89 cm2
AV Peak grad: 10 mmHg
Ao pk vel: 1.58 m/s
Area-P 1/2: 3.08 cm2
Height: 72 in
S' Lateral: 4.5 cm
Weight: 3668.45 [oz_av]

## 2024-07-24 LAB — CBC
HCT: 36.8 % — ABNORMAL LOW (ref 39.0–52.0)
Hemoglobin: 11.9 g/dL — ABNORMAL LOW (ref 13.0–17.0)
MCH: 30.5 pg (ref 26.0–34.0)
MCHC: 32.3 g/dL (ref 30.0–36.0)
MCV: 94.4 fL (ref 80.0–100.0)
Platelets: 170 K/uL (ref 150–400)
RBC: 3.9 MIL/uL — ABNORMAL LOW (ref 4.22–5.81)
RDW: 13.2 % (ref 11.5–15.5)
WBC: 12 K/uL — ABNORMAL HIGH (ref 4.0–10.5)
nRBC: 0 % (ref 0.0–0.2)

## 2024-07-24 LAB — I-STAT CG4 LACTIC ACID, ED
Lactic Acid, Venous: 1.9 mmol/L (ref 0.5–1.9)
Lactic Acid, Venous: 2 mmol/L (ref 0.5–1.9)
Lactic Acid, Venous: 2.1 mmol/L (ref 0.5–1.9)
Lactic Acid, Venous: 2.8 mmol/L (ref 0.5–1.9)

## 2024-07-24 LAB — CK: Total CK: 307 U/L (ref 49–397)

## 2024-07-24 LAB — URINALYSIS, ROUTINE W REFLEX MICROSCOPIC
Bilirubin Urine: NEGATIVE
Glucose, UA: NEGATIVE mg/dL
Hgb urine dipstick: NEGATIVE
Ketones, ur: NEGATIVE mg/dL
Leukocytes,Ua: NEGATIVE
Nitrite: NEGATIVE
Protein, ur: NEGATIVE mg/dL
Specific Gravity, Urine: 1.023 (ref 1.005–1.030)
pH: 5 (ref 5.0–8.0)

## 2024-07-24 LAB — PHOSPHORUS
Phosphorus: 3.8 mg/dL (ref 2.5–4.6)
Phosphorus: 3.9 mg/dL (ref 2.5–4.6)

## 2024-07-24 LAB — RESPIRATORY PANEL BY PCR

## 2024-07-24 LAB — I-STAT VENOUS BLOOD GAS, ED
Acid-base deficit: 2 mmol/L (ref 0.0–2.0)
Bicarbonate: 23.2 mmol/L (ref 20.0–28.0)
Calcium, Ion: 0.98 mmol/L — ABNORMAL LOW (ref 1.15–1.40)
HCT: 35 % — ABNORMAL LOW (ref 39.0–52.0)
Hemoglobin: 11.9 g/dL — ABNORMAL LOW (ref 13.0–17.0)
O2 Saturation: 100 %
Potassium: 5.3 mmol/L — ABNORMAL HIGH (ref 3.5–5.1)
Sodium: 133 mmol/L — ABNORMAL LOW (ref 135–145)
TCO2: 24 mmol/L (ref 22–32)
pCO2, Ven: 38.7 mmHg — ABNORMAL LOW (ref 44–60)
pH, Ven: 7.386 (ref 7.25–7.43)
pO2, Ven: 180 mmHg — ABNORMAL HIGH (ref 32–45)

## 2024-07-24 LAB — MAGNESIUM
Magnesium: 1.5 mg/dL — ABNORMAL LOW (ref 1.7–2.4)
Magnesium: 2 mg/dL (ref 1.7–2.4)

## 2024-07-24 LAB — TROPONIN I (HIGH SENSITIVITY)
Troponin I (High Sensitivity): 3304 ng/L (ref <18)
Troponin I (High Sensitivity): 4313 ng/L (ref <18)
Troponin I (High Sensitivity): 4475 ng/L (ref <18)
Troponin I (High Sensitivity): 4867 ng/L (ref <18)
Troponin I (High Sensitivity): 5053 ng/L (ref <18)
Troponin I (High Sensitivity): 5648 ng/L (ref <18)

## 2024-07-24 LAB — OSMOLALITY, URINE: Osmolality, Ur: 458 mosm/kg (ref 300–900)

## 2024-07-24 LAB — HEPARIN LEVEL (UNFRACTIONATED): Heparin Unfractionated: >1.1 [IU]/mL — ABNORMAL HIGH (ref 0.30–0.70)

## 2024-07-24 LAB — T4, FREE: Free T4: 1 ng/dL (ref 0.61–1.12)

## 2024-07-24 LAB — TSH: TSH: 6.678 u[IU]/mL — ABNORMAL HIGH (ref 0.350–4.500)

## 2024-07-24 LAB — GLUCOSE, CAPILLARY
Glucose-Capillary: 195 mg/dL — ABNORMAL HIGH (ref 70–99)
Glucose-Capillary: 208 mg/dL — ABNORMAL HIGH (ref 70–99)
Glucose-Capillary: 171 mg/dL — ABNORMAL HIGH (ref 70–99)

## 2024-07-24 LAB — SODIUM, URINE, RANDOM: Sodium, Ur: 41 mmol/L

## 2024-07-24 LAB — BRAIN NATRIURETIC PEPTIDE: B Natriuretic Peptide: 354.2 pg/mL — ABNORMAL HIGH (ref 0.0–100.0)

## 2024-07-24 LAB — APTT: aPTT: 150 s — ABNORMAL HIGH (ref 24–36)

## 2024-07-24 LAB — OSMOLALITY: Osmolality: 318 mosm/kg — ABNORMAL HIGH (ref 275–295)

## 2024-07-24 LAB — CBG MONITORING, ED
Glucose-Capillary: 242 mg/dL — ABNORMAL HIGH (ref 70–99)
Glucose-Capillary: 222 mg/dL — ABNORMAL HIGH (ref 70–99)

## 2024-07-24 LAB — PROCALCITONIN: Procalcitonin: 0.64 ng/mL

## 2024-07-24 LAB — CORTISOL-AM, BLOOD: Cortisol - AM: 13 ug/dL (ref 6.7–22.6)

## 2024-07-24 LAB — CREATININE, URINE, RANDOM: Creatinine, Urine: 52 mg/dL

## 2024-07-24 MED ORDER — MAGNESIUM SULFATE 2 GM/50ML IV SOLN
2.0000 g | Freq: Once | INTRAVENOUS | Status: AC
  Administered 2024-07-24: 2 g via INTRAVENOUS
  Filled 2024-07-24: qty 50

## 2024-07-24 MED ORDER — ASPIRIN 325 MG PO TABS
325.0000 mg | ORAL_TABLET | Freq: Once | ORAL | Status: AC
  Administered 2024-07-24: 325 mg via ORAL
  Filled 2024-07-24: qty 1

## 2024-07-24 MED ORDER — TRAZODONE HCL 50 MG PO TABS
50.0000 mg | ORAL_TABLET | Freq: Every evening | ORAL | Status: DC | PRN
  Administered 2024-07-24 – 2024-07-25 (×2): 50 mg via ORAL
  Filled 2024-07-24 (×2): qty 1

## 2024-07-24 MED ORDER — METRONIDAZOLE 500 MG/100ML IV SOLN
500.0000 mg | Freq: Two times a day (BID) | INTRAVENOUS | Status: DC
  Administered 2024-07-24: 500 mg via INTRAVENOUS
  Filled 2024-07-24: qty 100

## 2024-07-24 MED ORDER — INSULIN DEGLUDEC 100 UNIT/ML ~~LOC~~ SOPN: 15.0000 [IU] | PEN_INJECTOR | Freq: Every morning | SUBCUTANEOUS | Status: DC

## 2024-07-24 MED ORDER — DIPHENHYDRAMINE HCL 50 MG/ML IJ SOLN
12.5000 mg | Freq: Four times a day (QID) | INTRAMUSCULAR | Status: DC | PRN
  Administered 2024-07-24 (×2): 25 mg via INTRAVENOUS
  Filled 2024-07-24 (×2): qty 1

## 2024-07-24 MED ORDER — SODIUM CHLORIDE 0.9 % IV SOLN
2.0000 g | Freq: Two times a day (BID) | INTRAVENOUS | Status: DC
  Administered 2024-07-24: 2 g via INTRAVENOUS
  Filled 2024-07-24: qty 12.5

## 2024-07-24 MED ORDER — ACETAMINOPHEN 325 MG PO TABS: 650.0000 mg | ORAL_TABLET | Freq: Four times a day (QID) | ORAL | Status: DC | PRN

## 2024-07-24 MED ORDER — METHYLPREDNISOLONE SODIUM SUCC 40 MG IJ SOLR
40.0000 mg | Freq: Two times a day (BID) | INTRAMUSCULAR | Status: DC
  Administered 2024-07-24: 40 mg via INTRAVENOUS
  Filled 2024-07-24: qty 1

## 2024-07-24 MED ORDER — ONDANSETRON HCL 4 MG PO TABS: 4.0000 mg | ORAL_TABLET | Freq: Four times a day (QID) | ORAL | Status: DC | PRN

## 2024-07-24 MED ORDER — INSULIN GLARGINE 100 UNIT/ML ~~LOC~~ SOLN
15.0000 [IU] | Freq: Every day | SUBCUTANEOUS | Status: DC
  Administered 2024-07-24 – 2024-07-26 (×3): 15 [IU] via SUBCUTANEOUS
  Filled 2024-07-24 (×3): qty 0.15

## 2024-07-24 MED ORDER — HYDROXYZINE HCL 50 MG/ML IM SOLN
25.0000 mg | INTRAMUSCULAR | Status: AC
  Administered 2024-07-24: 25 mg via INTRAMUSCULAR
  Filled 2024-07-24: qty 0.5

## 2024-07-24 MED ORDER — ACETAMINOPHEN 650 MG RE SUPP
650.0000 mg | Freq: Four times a day (QID) | RECTAL | Status: DC | PRN
Start: 2024-07-24 — End: 2024-07-26

## 2024-07-24 MED ORDER — DIPHENHYDRAMINE HCL 50 MG/ML IJ SOLN: 12.5000 mg | Freq: Four times a day (QID) | INTRAMUSCULAR | Status: DC | PRN

## 2024-07-24 MED ORDER — INSULIN ASPART 100 UNIT/ML IJ SOLN
0.0000 [IU] | INTRAMUSCULAR | Status: DC
  Administered 2024-07-24 (×2): 3 [IU] via SUBCUTANEOUS
  Administered 2024-07-24 (×2): 2 [IU] via SUBCUTANEOUS
  Administered 2024-07-24: 3 [IU] via SUBCUTANEOUS
  Administered 2024-07-25: 1 [IU] via SUBCUTANEOUS
  Administered 2024-07-25: 2 [IU] via SUBCUTANEOUS
  Administered 2024-07-25 (×2): 1 [IU] via SUBCUTANEOUS
  Administered 2024-07-25: 2 [IU] via SUBCUTANEOUS
  Administered 2024-07-26 (×3): 1 [IU] via SUBCUTANEOUS

## 2024-07-24 MED ORDER — DIATRIZOATE MEGLUMINE & SODIUM 66-10 % PO SOLN
90.0000 mL | Freq: Once | ORAL | Status: AC
  Administered 2024-07-24: 90 mL via NASOGASTRIC
  Filled 2024-07-24: qty 90

## 2024-07-24 MED ORDER — ONDANSETRON HCL 4 MG/2ML IJ SOLN
4.0000 mg | Freq: Four times a day (QID) | INTRAMUSCULAR | Status: DC | PRN
  Administered 2024-07-24: 4 mg via INTRAVENOUS
  Filled 2024-07-24: qty 2

## 2024-07-24 MED ORDER — FAMOTIDINE IN NACL 20-0.9 MG/50ML-% IV SOLN
20.0000 mg | Freq: Two times a day (BID) | INTRAVENOUS | Status: DC
  Administered 2024-07-24 – 2024-07-26 (×5): 20 mg via INTRAVENOUS
  Filled 2024-07-24 (×5): qty 50

## 2024-07-24 MED ORDER — METOCLOPRAMIDE HCL 5 MG/ML IJ SOLN: 10.0000 mg | Freq: Three times a day (TID) | INTRAMUSCULAR | Status: DC | PRN

## 2024-07-24 MED ORDER — ALBUTEROL SULFATE (2.5 MG/3ML) 0.083% IN NEBU: 2.5000 mg | INHALATION_SOLUTION | Freq: Two times a day (BID) | RESPIRATORY_TRACT | Status: DC | PRN

## 2024-07-24 MED ORDER — PERFLUTREN LIPID MICROSPHERE
1.0000 mL | INTRAVENOUS | Status: AC | PRN
  Administered 2024-07-24: 3 mL via INTRAVENOUS

## 2024-07-24 MED ORDER — HEPARIN (PORCINE) 25000 UT/250ML-% IV SOLN
1400.0000 [IU]/h | INTRAVENOUS | Status: DC
  Administered 2024-07-24: 1400 [IU]/h via INTRAVENOUS
  Filled 2024-07-24: qty 250

## 2024-07-24 MED ORDER — VANCOMYCIN HCL 750 MG/150ML IV SOLN
750.0000 mg | INTRAVENOUS | Status: DC
  Filled 2024-07-24: qty 150

## 2024-07-24 MED ORDER — HEPARIN (PORCINE) 25000 UT/250ML-% IV SOLN
1100.0000 [IU]/h | INTRAVENOUS | Status: DC
  Administered 2024-07-24 (×2): 1200 [IU]/h via INTRAVENOUS
  Administered 2024-07-25: 1100 [IU]/h via INTRAVENOUS
  Filled 2024-07-24 (×2): qty 250

## 2024-07-24 MED ORDER — PANTOPRAZOLE SODIUM 40 MG IV SOLR
40.0000 mg | Freq: Every day | INTRAVENOUS | Status: DC
  Administered 2024-07-24 – 2024-07-25 (×2): 40 mg via INTRAVENOUS
  Filled 2024-07-24 (×2): qty 10

## 2024-07-24 MED ORDER — SODIUM CHLORIDE 0.9 % IV SOLN: INTRAVENOUS | Status: DC

## 2024-07-24 MED ORDER — PREDNISONE 20 MG PO TABS: 40.0000 mg | ORAL_TABLET | Freq: Every day | ORAL | Status: DC

## 2024-07-24 NOTE — Assessment & Plan Note (Addendum)
 Patient received a dose of epi and was initially hypotensive continue to cycle cardiac enzymes order echogram in the morning  Last troponin elevated to 3000 Cardiology consulted Patient now on heparin  Recommended aspirin  pending Results of echogram may need cardiac catheterization in the morning

## 2024-07-24 NOTE — ED Notes (Signed)
 Xray confirmed NG tube. Suction started, suction set per md order for 2 hours and then CT meds will be given per MD order. Meds to be given at 8am.

## 2024-07-24 NOTE — ED Notes (Signed)
 NG flushed and clamped after gastrografin  given.

## 2024-07-24 NOTE — Progress Notes (Signed)
 PHARMACY - ANTICOAGULATION CONSULT NOTE  Pharmacy Consult for heparin  Indication: atrial fibrillation, hx of PE  Allergies  Allergen Reactions   Codeine Hives and Itching   Ofev  [Nintedanib ] Diarrhea    SEVERE DIARRHEA   Pirfenidone  Diarrhea and Other (See Comments)    Esbriet  (Pirfenidone ) causes elevated LFTs. D/C on 06/14/17 and SEVERE DIARRHEA    Amoxicillin Itching    Patient Measurements: Height: 6' (182.9 cm) Weight: 104 kg (229 lb 4.5 oz) (Wt from 06/25/2024) IBW/kg (Calculated) : 77.6 HEPARIN  DW (KG): 99.1  Vital Signs: Temp: 97.7 F (36.5 C) (08/20 1228) Temp Source: Oral (08/20 1228) BP: 118/75 (08/20 1228) Pulse Rate: 62 (08/20 1228)  Labs: Recent Labs    07/23/24 1731 07/23/24 1737 07/23/24 1831 07/23/24 1938 07/23/24 2353 07/24/24 0019 07/24/24 0158 07/24/24 0407 07/24/24 1000 07/24/24 1036 07/24/24 1100  HGB 14.7   < > 15.6  --   --  11.9*  --   --   --  11.9*  --   HCT 44.8   < > 46.0  --   --  35.0*  --   --   --  36.8*  --   PLT 245  --   --   --   --   --   --   --   --  170  --   APTT  --   --   --   --   --   --   --   --   --   --  150*  HEPARINUNFRC  --   --   --   --   --   --   --   --  >1.10*  --   --   CREATININE 1.67*  --  1.70*  --   --   --   --   --   --   --   --   CKTOTAL  --   --   --   --  307  --   --   --   --   --   --   TROPONINIHS 18*  --   --    < > 3,304*  --  4,313* 4,475*  --  5,053*  --    < > = values in this interval not displayed.    Estimated Creatinine Clearance: 41.1 mL/min (A) (by C-G formula based on SCr of 1.7 mg/dL (H)).  Medications:  -Eliquis  5mg  PO BID (Last dose 8/19 AM)  Assessment: 35 yoM presented to ED with emesis, hypotension, and SOB. Pharmacy consulted to dose heparin  for afib/hx of PE. 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   -5/25: NM perfusion indicated high probability of PE, CTa this admission showing no acute PE -CT  abdomen: ileus vs partial SBO + resolving hematoma in R. Retroperitoneum   PM: aPTT supratherapeutic at 150 sec, confirmed that drawn from opposite arm as heparin  infusion.   Goal of Therapy:  Heparin  level 0.3-0.7 units/ml aPTT 66-102 seconds Monitor platelets by anticoagulation protocol: Yes   Plan:  Hold heparin  drip x 1 hour Resume at reduced rate of 1200 units/hr Check aPTT/anti-Xa level in 8 hours and daily while on heparin  Monitor with aPTT until correlates with heparin  level Continue to monitor H&H and platelets Follow up transition back to Eliquis    Rocky Slade, PharmD, BCPS Clinical Pharmacist 07/24/2024 3:27 PM

## 2024-07-24 NOTE — Assessment & Plan Note (Signed)
Continue Eliquis 5 mg p.o. twice daily

## 2024-07-24 NOTE — Assessment & Plan Note (Addendum)
 Noted to have rash and evidence of hypotension Emergency Department responded well to steroids and epi.  Continue to monitor closely continue IV steroids Pepcid  and Benadryl  monitor progressive  Consider workup for alpha gal Given history

## 2024-07-24 NOTE — Assessment & Plan Note (Signed)
 Continue Eliquis  if needs to be n.p.o. will transition to heparin 

## 2024-07-24 NOTE — ED Notes (Signed)
Phlebotomy notified of need for labs

## 2024-07-24 NOTE — Assessment & Plan Note (Signed)
 Chronic stable

## 2024-07-24 NOTE — Progress Notes (Signed)
 Progress Note   Patient: Shane [name] FMW:[id number] DOB: 23-Aug-1941 DOA: 07/23/2024     1 DOS: the patient was seen and examined on 07/24/2024 at 8:22AM      Brief hospital course: 83 y.o. 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     In the ER c/o SOB, noted to have rash and hypotension, treated for presumed anaphylaxis.  Troponins jumped to 3000s.  CTA ruled out PE.  CT abdomen showed pSBO, NG placed.  Cards and Gen Surg consulted.     Assessment and Plan: Hypotension, possible anaphylaxis Initial presenting complaint was few days not feeling well but appeared to have normal activity, then passing out in the bathroom.  In the ER, noted to have diffuse itching and hive-like rash, so given epinephrine  and H1RA/steroids.  He can think of no trigger (food or medicine) for possible anaphylaxis.   - Hold further steroids   Partial small bowel obstruction by radiography Patient did have some vague abdominal discomfort/soreness on admission, which was new.  No diarrhea, no vomiting.  CT abdomen showed partial SBO only.  Surgery consulted and NG placed - Follow small bowel series - IV fluids - Defer NG management to Surgery - No clear source of infection, stop Cefepime  and Flagyl  and vancomycin  and monitor   Type II demand NSTEMI Known CAD.  Troponins increased to 4000 overnight.  ECG wthout changes.  Cardiology consulted recommended 48 hours heparin , echocardiogram.  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  He has some mild soreness in the abdomen, but this is nonfocal.  He said no vomiting.  He is passing gas.  He has had no fever.  He has no confusion.  His hypotension is resolved.  He has had no further chest pain.     Physical Exam: BP 118/75 (BP Location: Right Arm)   Pulse 62   Temp 97.7 F (36.5 C) (Oral)   Resp 18   Ht 6' (1.829 m)   Wt 104 kg Comment: Wt from 06/25/2024  SpO2 100%   BMI 31.10 kg/m   Obese adult male, lying in bed, appears chronically ill RRR, no murmurs, no peripheral edema Respiratory rate normal, lungs clear without rales or wheezes Abdomen  soft without guarding, he has some mild tenderness to palpation throughout, but no rigidity, rebound, and palpation is soft and fairly mild Attention normal, affect normal, judgment and insight appear normal, generalized weakness but symmetric strength    Data Reviewed: Basic metabolic panel shows mild hyponatremia, mild hyperkalemia CBC shows mild leukocytosis     Family Communication: Wife at bedside    Disposition: Status is: Inpatient 83 yo M medically complex with chronic multiorgan failure presented after syncope.    Exam  nonfocal, CT shows maybe pSBO?  Troponins elevated but Cardiology recommending no work up.  Will need to continue treatment of SBO, remove NG hopefully in next 24 hours and advance diet.    If no signs of infection, hopefully home in 48 hours        Author: Lonni SHAUNNA Dalton, MD 07/24/2024 4:50 PM  For on call review [email or web address].

## 2024-07-24 NOTE — Progress Notes (Addendum)
 Rounding Note   Patient Name: Shane [name] Date of Encounter: 07/24/2024  [facility] Cardiologist: Redell Shallow, MD Patient follows with [facility] for EP services  Subjective Reports feeling better.  Patient showed around 2 PM yesterday he was outside working on his car with his family.  He suddenly developed shortness of breath chest discomfort that was worse with perspiration, he described it as not being able to get enough breath and.  He also noted he was lightheaded, dizzy, diaphoretic. Denied palpitations.  Patient shared the chest pain this instance was different from his chest pain prior to his CABG. Since his CABG in 2020 patient has only noticed chest pain at rest in the evenings.  He has noticed some orthopnea and PND ever since his January admission which he has attributed to his COVID infection and lasting effects.  He has noticed no weight change.  Occasionally notices swelling peripherally, he is on Lasix  40 mg twice daily.  Reports good urine output.  He does use his CPAP.  Medications include aspirin  81 and Eliquis  5  Scheduled Meds:  diatrizoate  meglumine -sodium  90 mL Per NG tube Once   insulin  aspart  0-9 Units Subcutaneous Q4H   insulin  glargine  15 Units Subcutaneous Daily   methylPREDNISolone  (SOLU-MEDROL ) injection  40 mg Intravenous Q12H   Followed by   NOREEN ON 07/25/2024] predniSONE   40 mg Oral Q breakfast   pantoprazole  (PROTONIX ) IV  40 mg Intravenous QHS   Continuous Infusions:  sodium chloride      sodium chloride  125 mL/hr at 07/24/24 0053   ceFEPime  (MAXIPIME ) IV Stopped (07/24/24 0450)   famotidine  (PEPCID ) IV     heparin  1,400 Units/hr (07/24/24 0155)   metronidazole  Stopped (07/24/24 0606)   vancomycin      PRN Meds: acetaminophen  **OR** acetaminophen , albuterol , diphenhydrAMINE , metoCLOPramide  (REGLAN ) injection, ondansetron  **OR** ondansetron  (ZOFRAN ) IV   Vital Signs  Vitals:   07/24/24 0615 07/24/24 0630  07/24/24 0645 07/24/24 0700  BP: (!) 96/55 (!) 111/58 (!) 104/57 (!) 107/54  Pulse: 63 64 62 68  Resp: 14 16 12 14   Temp:      TempSrc:      SpO2: 100% 100% 100% 100%  Weight:      Height:        Intake/Output Summary (Last 24 hours) at 07/24/2024 0727 Last data filed at 07/24/2024 0606 Gross per 24 hour  Intake 4173.05 ml  Output 800 ml  Net 3373.05 ml      07/24/2024   12:00 AM 05/09/2024    2:31 PM 04/14/2024    5:00 AM  Last 3 Weights  Weight (lbs) 229 lb 4.5 oz 232 lb 12.8 oz 227 lb 4.7 oz  Weight (kg) 104 kg 105.597 kg 103.1 kg      Telemetry Intermittent episodes of sinus with first degree AV block and at times ventricularly placed, frequent PVCs HR 60-70- Personally Reviewed  ECG  See A & P- Personally Reviewed  Physical Exam GEN: No acute distress on [place] and with NG in place.   Cardiac: RRR, no murmurs, rubs, or gallops.  Respiratory: Fine crackles bilaterally GI: Tender to left upper quadrant and right lower quadrant, distended with noticeable bulge to the left upper quadrant MS: No edema; right BKA, left metatarsal amputation.  Compression stockings in place. Neuro:  Nonfocal  Psych: Normal affect   Labs High Sensitivity Troponin:   Recent Labs  Lab 07/23/24 1731 07/23/24 1938 07/23/24 2353 07/24/24 0158 07/24/24 0407  TROPONINIHS  18* 93* 3,304* 4,313* 4,475*     Chemistry Recent Labs  Lab 07/23/24 1731 07/23/24 1737 07/23/24 1831 07/23/24 2353 07/24/24 0019  NA 134* 137 136  --  133*  K 4.9 4.8 4.7  --  5.3*  CL 95*  --  101  --   --   CO2 23  --   --   --   --   GLUCOSE 158*  --  153*  --   --   BUN 53*  --  76*  --   --   CREATININE 1.67*  --  1.70*  --   --   CALCIUM  8.1*  --   --   --   --   MG  --   --   --  1.5*  --   PROT 6.9  --   --   --   --   ALBUMIN  3.1*  --   --   --   --   AST 39  --   --   --   --   ALT 11  --   --   --   --   ALKPHOS 98  --   --   --   --   BILITOT 1.7*  --   --   --   --   GFRNONAA 40*  --   --   --    --   ANIONGAP 16*  --   --   --   --     Lipids No results for input(s): CHOL, TRIG, HDL, LABVLDL, LDLCALC, CHOLHDL in the last 168 hours.  Hematology Recent Labs  Lab 07/23/24 1731 07/23/24 1737 07/23/24 1831 07/24/24 0019  WBC 15.1*  --   --   --   RBC 4.80  --   --   --   HGB 14.7 15.0 15.6 11.9*  HCT 44.8 44.0 46.0 35.0*  MCV 93.3  --   --   --   MCH 30.6  --   --   --   MCHC 32.8  --   --   --   RDW 13.1  --   --   --   PLT 245  --   --   --    Thyroid  Recent Labs  Lab 07/23/24 1906 07/23/24 2353  TSH 6.678*  --   FREET4  --  1.00    BNP Recent Labs  Lab 07/24/24 0407  BNP 354.2*    DDimer No results for input(s): DDIMER in the last 168 hours.   Radiology  DG Abdomen 1 View Result Date: 07/24/2024 CLINICAL DATA:  NG tube placement. EXAM: ABDOMEN - 1 VIEW COMPARISON:  01/06/2018 FINDINGS: NG tube tip is in the stomach with proximal side port below the GE junction. Visualized upper abdomen demonstrates nonspecific bowel gas pattern. IMPRESSION: NG tube tip is in the stomach. Electronically Signed   By: Camellia Candle M.D.   On: 07/24/2024 05:23   CT Angio Chest PE W and/or Wo Contrast Result Date: 07/23/2024 CLINICAL DATA:  Chest pain and shortness of breath. Concern for pulmonary disease. EXAM: CT ANGIOGRAPHY CHEST WITH CONTRAST TECHNIQUE: Multidetector CT imaging of the chest was performed using the standard protocol during bolus administration of intravenous contrast. Multiplanar CT image reconstructions and MIPs were obtained to evaluate the vascular anatomy. RADIATION DOSE REDUCTION: This exam was performed according to the departmental dose-optimization program which includes automated exposure control, adjustment of the mA and/or kV according  to patient size and/or use of iterative reconstruction technique. CONTRAST:  75mL OMNIPAQUE  IOHEXOL  350 MG/ML SOLN COMPARISON:  Chest CT dated 05/31/2024. FINDINGS: Evaluation of this exam is limited due to  respiratory motion. Cardiovascular: There is mild cardiomegaly. No pericardial effusion. There is coronary vascular calcification. Aortic valve repair. Mild atherosclerotic calcification of the thoracic aorta. No aneurysmal dilatation. Evaluation of the pulmonary arteries is limited due to respiratory motion as well as streak artifact caused by patient's arms. No porta artery embolus identified. Mediastinum/Nodes: Mildly enlarged right hilar lymph nodes measure 11 mm. The esophagus is grossly unremarkable no mediastinal fluid collection. Lungs/Pleura: Diffuse interstitial coarsening and fibrosis in keeping with interstitial lung disease. Small left pleural effusion. No focal consolidation or pneumothorax. The central airways are patent. Upper Abdomen: No acute abnormality. Musculoskeletal: Osteopenia with degenerative changes. Median sternotomy wires. No acute osseous pathology. Review of the MIP images confirms the above findings. IMPRESSION: 1. No CT evidence of pulmonary artery embolus. 2. Interstitial lung disease. 3. Small left pleural effusion. 4.  Aortic Atherosclerosis (ICD10-I70.0). Electronically Signed   By: Vanetta Chou M.D.   On: 07/23/2024 19:31   CT ABDOMEN PELVIS W CONTRAST Result Date: 07/23/2024 CLINICAL DATA:  Nausea and vomiting EXAM: CT ABDOMEN AND PELVIS WITH CONTRAST TECHNIQUE: Multidetector CT imaging of the abdomen and pelvis was performed using the standard protocol following bolus administration of intravenous contrast. RADIATION DOSE REDUCTION: This exam was performed according to the departmental dose-optimization program which includes automated exposure control, adjustment of the mA and/or kV according to patient size and/or use of iterative reconstruction technique. CONTRAST:  75mL OMNIPAQUE  IOHEXOL  350 MG/ML SOLN COMPARISON:  CT abdomen and pelvis 12/30/2023. FINDINGS: Lower chest: Chronic interstitial changes are again noted in the lung bases. The heart is enlarged.  Hepatobiliary: No focal liver abnormality is seen. No gallstones, gallbladder wall thickening, or biliary dilatation. Pancreas: Unremarkable. No pancreatic ductal dilatation or surrounding inflammatory changes. Spleen: Normal in size without focal abnormality. Adrenals/Urinary Tract: There is no hydronephrosis or perinephric stranding. No perinephric fluid collection identified. Knee adrenal glands and bladder are within normal limits. Stomach/Bowel: There are mildly dilated central small bowel loops. No wall thickening or inflammation. No transition point visualized. The colon is nondilated. There is diffuse colonic diverticulosis. The appendix is normal. Stomach is within normal limits. Vascular/Lymphatic: Aortic atherosclerosis. No enlarged abdominal or pelvic lymph nodes. Reproductive: Prostate is unremarkable. Other: There is no free air. There is trace free fluid in the left upper quadrant previously identified right retroperitoneal hematoma has significantly decreased in size. Enhancing low-attenuation areas seen in the right retroperitoneum measuring 9.1 x 2.7 x 5.3 cm. There is no focal abdominal wall hernia. Musculoskeletal: Degenerative changes affect the spine the hips. There are laminectomy defects at L3-L4. IMPRESSION: 1. Mildly dilated central small bowel loops without transition point. Findings may represent ileus or partial small bowel obstruction. 2. Trace free fluid in the left upper quadrant. 3. Enhancing low-attenuation collection in the right retroperitoneum is most likely related to resolving hematoma. Infection not excluded. 4. Colonic diverticulosis. Aortic Atherosclerosis (ICD10-I70.0). Electronically Signed   By: Greig Pique M.D.   On: 07/23/2024 19:30   DG Chest Portable 1 View Result Date: 07/23/2024 CLINICAL DATA:  Short of breath, vomiting EXAM: PORTABLE CHEST 1 VIEW COMPARISON:  04/11/2024 FINDINGS: Two frontal views of the chest demonstrate stable postsurgical changes from CABG  and aortic valve replacement. Cardiac silhouette remains enlarged. Chronic background pulmonary scarring and fibrosis, with increased pulmonary vascular congestion and patchy bibasilar  consolidation consistent with superimposed edema. Small bilateral effusions. No pneumothorax. IMPRESSION: 1. Congestive heart failure superimposed upon chronic pulmonary fibrosis and scarring. Electronically Signed   By: Ozell Daring M.D.   On: 07/23/2024 18:24    Cardiac Studies Echocardiogram 04/2024 IMPRESSIONS     1. Left ventricular ejection fraction, by estimation, is 35 to 40%. Left  ventricular ejection fraction by 2D MOD biplane is 39.9 %. The left  ventricle has moderately decreased function. The left ventricle  demonstrates regional wall motion abnormalities  (see scoring diagram/findings for description). There is severe concentric  left ventricular hypertrophy. Left ventricular diastolic parameters are  indeterminate.   2. Right ventricular systolic function is normal. The right ventricular  size is normal.   3. The mitral valve is normal in structure. No evidence of mitral valve  regurgitation. No evidence of mitral stenosis.   4. The aortic valve has been repaired/replaced. Aortic valve  regurgitation is not visualized. No aortic stenosis is present. There is a  29 mm native pulmonic valve present in the aortic position.   5. The inferior vena cava is normal in size with greater than 50%  respiratory variability, suggesting right atrial pressure of 3 mmHg.   Patient Profile   83 y.o. male with a history of ischemic HFrEF 35-40%, CAD s/p CABG [LIMA-LAD, SVG-OM1, SVG-PDA] in 2021, AS s/p TAVR 08/2023, paroxysmal atrial fibrillation not on anticoagulation, SSS s/p leadless pacemaker, IPF on home O2, hx of recent PE 04/2024, non-healing foot ulcer on left extremity with left metatarsal amputation, right BKA, hypertension, CKD, chronic anemia, T2DM and hx of retroperitoneal hematoma 12/2023 who  presented to the ED on 8/19 for increased shortness of breath with hypoxia (78% on RA) and emesis. He was hypotensive on arrival 88/44. Given concern for anaphylaxis patient was given steroids and epi. Lactic acid [4.1 -> 7.4] with leukocytosis with elevated neutrophil count, now on IV antibiotics  SBO seen on CT A/P, surgery consulted. Elevated troponins, cardiology consulted.   Assessment & Plan  NSTEMI -Demand Ischemia Type II Patient was hypotensive and hypoxic on arrival, given concern for anaphylaxis he was given steroids and epinephrine .  He was noted to have an elevated lactic acid up to 7.4 with leukocytosis, he was given fluids and IV antibiotics There is currently concern for an SBO. Troponin trend 18 -> 93 -> 3,304 -> 4313 -> 4,475 Lactic acidosis improving, now 1.9 ECGs are similar to previous As there was initially concern for ACS he was placed on heparin  gtt and loaded with ASA Echocardiogram pending  Suspect that his troponin elevation is due to demand ischemia in a patient with underlying coronary artery disease. Of note patient had impressive troponin elevation ~8,290 3 with his 12/2023 admission attributed to NSTEMI Type II.  Obtain an echocardiogram, however it will be difficult to assess for regional wall motion abnormalities as he does have this at baseline. Would not pursue cardiac catheterization at this time.   Continue heparin  GTT for 48 hours Continue ASA 81 mg  CAD s/p CABG [LIMA-LAD, SVG-OM1, SVG-PDA] Hyperlipidemia Discontinued plavix  after patient developed retroperitoneal hematoma Denied any anginal chest pain since his CABG, only notices an occasional chest pain at rest in the evenings. He did note chest discomfort at onset of symptoms, described worse with inspiration.  Reported this chest pain felt differently than his prior ischemic chest pain.   Continue ASA 81 mg Continue Lipitor  80 mg  Hx of hypertension Hypotension on arrival BP:109/52 Continue to  hold antihypertensives  HFrEF Most recent echo shows LVEF 35 to 40% with RWMA and severe concentric LVH. BNP 354 CXR shows congestive heart failure superimposed upon chronic pulmonary fibrosis and scarring. Net + 3.1 L as patient is receiving IV fluids and IV antibiotics  Given concern for infiltrative cardiomyopathy based on previous echocardiogram findings and low voltage, obtained serum IFE, urine IFE, and free lights.  Results pending.  Will hold off on CMR at this time given the patient's acute presentation.   GDMT has been challenging 2/2 hypotension and renal function PTA medication includes Lasix  40 mg twice daily  Echocardiogram pending Continue to hold Lasix  as patient is still hypotensive, though would continue to closely monitor volume status as patient is receiving IV fluids and IV antibiotics.   Atrial Fibrillation Patient was referred for watchman given risk of systemic anticoagulation 2/2 to recent retroperitoneal hematoma Patient is on PTA Eliquis  5 mg twice daily, though he may need to be on a reduced dose given his age and renal function. Will reach out to pharmacy. Continue to hold Eliquis  Continue heparin  gtt for 48 hours  SSS s/p leadless pacemaker Syncope Patient told wife he had passed out in the bathroom, most likely due to his hypotension however, he does have a recent admission for syncope thought to be related to hypotension, dehydration, and acute PE at that time 04/2024.  CTA negative for PE.  Also noted during that admission his pacemaker was reprogrammed due to dyssynchrony noted on telemetry. Would continue to monitor cardiac telemetry while admitted.   Hypomagnesium  Repleted  Per primary SBO T2DM CKD (baseline ~ 1.6) IPF on home 02 Hx of recent PE Non-healing ulcer Chronic anemia Retroperitoneal hematoma     For questions or updates, please contact [facility] Please consult [email or web address] for contact info under      Signed, Leontine LOISE Salen, PA-C  07/24/2024, 7:27 AM    Spoke with pharmacy, continue eliquis  5 mg BID for PE treatment, then could consider reduced dose for AF management  Leontine Salen PA-C  Agree with note by Leontine Salen, PA-C  We are asked to see this patient because of elevated troponins in the 4000 range.  He is a cardiology patient of Dr. Vertie but follows at [facility].  He said bypass surgery in 2020 and TAVR within the last year.  He also has PAF on Eliquis .  He has had a retroperitoneal bleed in the past as well as a leadless pacemaker for sick sinus syndrome and syncope.  He does have moderate LV dysfunction with an EF of 35 to 40% range.  He had COVID earlier this year and had a prolonged hospitalization including brief intubation.  He has had idiopathic pulmonary fibrosis since that time followed by Dr. Geronimo.  He had an episode of shortness of breath and hypotension this morning.  He did have a moderate increase in his lactic acid which is improving.  His EKG showed sinus rhythm with right bundle branch block.  He specifically denied chest pain.  The etiology of his acute onset shortness of breath is still unclear.  His medications are on hold.  I suspect that his elevated troponins were demand ischemia.  Will continue to follow with you.  2D echo is pending.  Dorn DOROTHA Lesches, M.D., FACP, [facility], FAHA, [facility]  [address] [place], [place]  [address]  [phone] 07/24/2024 1:16 PM

## 2024-07-24 NOTE — Assessment & Plan Note (Signed)
 Status post pacemaker

## 2024-07-24 NOTE — Consult Note (Signed)
 Cardiology Consultation   Patient ID: [name] MRN: [id number]; DOB: 09-Jul-1941  Admit date: 07/23/2024 Date of Consult: 07/24/2024  PCP:  Shepard Ade, MD   [facility] Providers Cardiologist:  Redell Shallow, MD  Electrophysiologist:  Danelle Birmingham, MD       Patient Profile: Shane [name] is a 83 y.o. 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  History of Present Illness:  History limited by patient somnolence. History gathered from patient and wife.   Shane [name] reports progressive dyspnea exacerbated by exertion for the past two days with particular worsening this afternoon. He called his wife this afternoon and relayed to her that he had passed out in the bathroom. Per ED notes he was nauseous and vomiting on arrival with hypotensive blood pressures of unclear etiology. He was treated as possible anaphylaxis and received epinephrine , IV fluid, benadryl , and steroids. His workup revealed partial small bowel obstruction managed conservatively with anti-emetics. He has a resolving retroperitoneal hematoma initially illustrated in January of 2025 during a long Covid admission. CT chest ruled out pulmonary embolism.  He denies orthopnea, PND, increased LE edema, or chest pain/discomfort. He reports baseline recurrent chest pain that is non-exertional and begins randomly, lasts for up to an hour, and self-resolves.   Past Medical History:  Diagnosis Date   Acute blood loss anemia    Anxiety    AV block, Mobitz 1    Cataract    Chronic kidney disease    d/t DM   CKD (chronic kidney disease), stage III (HCC)    COVID-19 virus infection 07/18/2020   Last Assessment & Plan:   Formatting of  this note might be different from the original.  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 10/2019 Echo: EF 55-60%, Gr2 DD. Nl RV.    Neuromuscular disorder (HCC)    Neuropathy associated with endocrine disorder (HCC)    Nonobstructive CAD (coronary artery disease)    a. 2012 Cath: mod, nonobs dzs; b. 10/2016 MV: EF 60%, no ischemia.   OSA on CPAP 05/05/2017   Unattended Home Sleep Test 7/2/813-AHI 38.6/hour, desaturation to 64%, body weight 261 pounds   PONV (postoperative nausea and vomiting)    Postoperative anemia due to acute blood loss 11/07/2020   Postoperative hemorrhagic shock 03/20/2021   Sleep apnea     uses cpap asked to bring mask and tubing    Past Surgical History:  Procedure Laterality Date   ABDOMINAL AORTOGRAM W/LOWER EXTREMITY N/A 12/10/2020   Procedure: ABDOMINAL AORTOGRAM W/LOWER EXTREMITY;  Surgeon: Magda Debby SAILOR, MD;  Location: [facility] INVASIVE CV LAB;  Service: Cardiovascular;  Laterality: N/A;   AMPUTATION Right 01/22/2021  Procedure: 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;  Surgeon: Harden Jerona GAILS, MD;  Location: [facility] OR;  Service: Orthopedics;  Laterality: Right;   AMPUTATION Right 03/17/2021   Procedure: RIGHT BELOW KNEE AMPUTATION;  Surgeon: Harden Jerona GAILS, MD;  Location: [facility] OR;  Service: Orthopedics;  Laterality: Right;   ANKLE FUSION Right 01/22/2021   Procedure: RIGHT FOOT TIBIOCALCANEAL FUSION;  Surgeon: Harden Jerona GAILS, MD;  Location: [facility] OR;  Service: Orthopedics;  Laterality: Right;   ANTERIOR FUSION CERVICAL SPINE  2012    CARDIAC CATHETERIZATION  2011   CARDIAC CATHETERIZATION N/A 11/09/2016   Procedure: Right Heart Cath;  Surgeon: Victory LELON Sharps, MD;  Location: [facility] INVASIVE CV LAB;  Service: Cardiovascular;  Laterality: N/A;   carpel tunnel     left wrist   CATARACT EXTRACTION     CATARACT EXTRACTION W/ INTRAOCULAR LENS  IMPLANT, BILATERAL  2013   CERVICAL LAMINECTOMY  2012   COLONOSCOPY N/A 01/14/2013   Procedure: COLONOSCOPY;  Surgeon: Norleen LOISE Kiang, MD;  Location: [facility] ENDOSCOPY;  Service: Endoscopy;  Laterality: N/A;   CORONARY ARTERY BYPASS GRAFT  11/04/2020   LIMA-LAD, SVG-OM1, SVG-PDA (Dr Norleen Exon [facility]) dc 11/18/2020   EYE SURGERY     I & D EXTREMITY Right 02/19/2021   Procedure: RIGHT ANKLE DEBRIDEMENT AND PLACEMENT ANTIBIOTIC BEADS;  Surgeon: Harden Jerona GAILS, MD;  Location: [facility] OR;  Service: Orthopedics;  Laterality: Right;   KNEE SURGERY  1998   left   LEFT HEART CATH AND CORONARY ANGIOGRAPHY N/A 07/10/2020   Procedure: LEFT HEART CATH AND CORONARY ANGIOGRAPHY;  Surgeon: Wonda Sharper, MD;  Location: [facility] INVASIVE CV LAB;  Service: Cardiovascular;  Laterality: N/A;   LUMBAR LAMINECTOMY  2003   LUNG BIOPSY Left 12/26/2016   Procedure: LUNG BIOPSY;  Surgeon: Elspeth JAYSON Millers, MD;  Location: [facility] OR;  Service: Thoracic;  Laterality: Left;   PACEMAKER IMPLANT N/A 03/30/2020   Procedure: PACEMAKER IMPLANT;  Surgeon: Waddell Danelle LELON, MD;  Location: [facility] INVASIVE CV LAB;  Service: Cardiovascular;  Laterality: N/A;   PERIPHERAL VASCULAR INTERVENTION Right 12/10/2020   Procedure: PERIPHERAL VASCULAR INTERVENTION;  Surgeon: Magda Debby LOISE, MD;  Location: [facility]VAS[facility] LAB;  Service: Cardiovascular;  Laterality: Right;  SFA   POSTERIOR FUSION CERVICAL SPINE  2012   PPM GENERATOR REMOVAL N/A 12/14/2020   Procedure: PPM GENERATOR REMOVAL;  Surgeon: Waddell Danelle LELON, MD;  Location: [facility] INVASIVE CV LAB;  Service: Cardiovascular;  Laterality: N/A;   TEE WITHOUT CARDIOVERSION N/A 12/11/2020   Procedure: TRANSESOPHAGEAL  ECHOCARDIOGRAM (TEE);  Surgeon: Barbaraann Darryle Debby, MD;  Location: [facility] ENDOSCOPY;  Service: Cardiovascular;  Laterality: N/A;   TRIGGER FINGER RELEASE  2011   4th finger left hand   VIDEO ASSISTED THORACOSCOPY Left 12/26/2016   Procedure: VIDEO ASSISTED THORACOSCOPY;  Surgeon: Elspeth JAYSON Millers, MD;  Location: [facility] OR;  Service: Thoracic;  Laterality: Left;   VIDEO BRONCHOSCOPY N/A 12/26/2016   Procedure: VIDEO BRONCHOSCOPY;  Surgeon: Elspeth JAYSON Millers, MD;  Location: [facility] OR;  Service: Thoracic;  Laterality: N/A;     Home Medications:  Prior to Admission medications   Medication Sig Start Date End Date Taking? Authorizing Provider  Acetaminophen  (TYLENOL  PO) Take 2 tablets by mouth as needed. May take 2 tablet by mouth 3 hours after taking the trazodone  75mg  prn.   Yes [provider]  albuterol  (PROVENTIL ) (2.5 MG/3ML) 0.083% nebulizer solution Take 2.5 mg by nebulization 2 (two) times daily as needed for wheezing or shortness of breath.   Yes  [provider]  albuterol  (VENTOLIN  HFA) 108 (90 Base) MCG/ACT inhaler Inhale 2 puffs into the lungs every 4 (four) hours as needed for wheezing or shortness of breath. Patient taking differently: Inhale 3 puffs into the lungs every 4 (four) hours as needed for wheezing or shortness of breath. 04/12/21  Yes Angiulli, Toribio PARAS, PA-C  apixaban  (ELIQUIS ) 5 MG TABS tablet Take 5 mg by mouth 2 (two) times daily.   Yes [provider]  Ascorbic Acid  (VITAMIN C  WITH ROSE HIPS) 500 MG tablet Take 1 tablet (500 mg total) by mouth daily. Patient taking differently: Take 500 mg by mouth in the morning. 04/12/21  Yes Angiulli, Toribio PARAS, PA-C  aspirin  EC 81 MG tablet Take 81 mg by mouth daily.   Yes [provider]  atorvastatin  (LIPITOR ) 80 MG tablet TAKE 1 TABLET BY MOUTH EVERY DAY Patient taking differently: Take 80 mg by mouth at bedtime. 07/25/22  Yes Pietro Redell RAMAN, MD  benzonatate  (TESSALON ) 100 MG capsule Take 1 capsule  (100 mg total) by mouth 3 (three) times daily. Patient taking differently: Take 100 mg by mouth as needed for cough. 12/29/23  Yes Tobie Yetta HERO, MD  cyanocobalamin  1000 MCG tablet Take 1 tablet (1,000 mcg total) by mouth daily. Patient taking differently: Take 1,000 mcg by mouth in the morning. 12/30/23  Yes Tobie Yetta HERO, MD  docusate sodium  (COLACE) 100 MG capsule Take 1 capsule (100 mg total) by mouth 2 (two) times daily. Patient taking differently: Take 100 mg by mouth as needed for mild constipation or moderate constipation. 12/29/23  Yes Tobie Yetta HERO, MD  escitalopram  (LEXAPRO ) 10 MG tablet Take 1 tablet (10 mg total) by mouth daily. Patient taking differently: Take 10 mg by mouth in the morning. 04/12/21  Yes Angiulli, Toribio PARAS, PA-C  furosemide  (LASIX ) 40 MG tablet Take 1 tablet (40 mg total) by mouth 2 (two) times daily. 04/14/24 04/14/25 Yes Fairy Frames, MD  gabapentin  (NEURONTIN ) 100 MG capsule Take 1 capsule (100 mg total) by mouth 3 (three) times daily. 12/29/23  Yes Tobie Yetta HERO, MD  HUMALOG  KWIKPEN 100 UNIT/ML KwikPen Inject 10-15 Units into the skin 3 (three) times daily. Can take 15-18 Units with meals while on Prednisone  for 3days 04/14/24  Yes Fairy Frames, MD  hydrOXYzine  (ATARAX ) 25 MG tablet Take 25 mg by mouth at bedtime. 03/06/24  Yes [provider]  lisinopril  (ZESTRIL ) 10 MG tablet Take 10 mg by mouth daily. 05/08/24  Yes [provider]  LORazepam  (ATIVAN ) 1 MG tablet Take 1 tablet (1 mg total) by mouth at bedtime as needed for sleep. 12/29/23  Yes Tobie Yetta HERO, MD  Magnesium  200 MG TABS 1 tablet at bedtime Orally Once a day 03/06/24  Yes [provider]  Multiple Vitamin (MULTIVITAMIN WITH MINERALS) TABS tablet Take 1 tablet by mouth in the morning.   Yes [provider]  mupirocin  ointment (BACTROBAN ) 2 % Apply 1 Application topically at bedtime. 06/06/23  Yes [provider]  Nutritional Supplements (FEEDING SUPPLEMENT,  NEPRO CARB STEADY,) LIQD Take 237 mLs by mouth 2 (two) times daily between meals. Patient taking differently: Take 237 mLs by mouth daily. Premier Protein Shake 12/29/23  Yes Tobie Yetta HERO, MD  Olopatadine HCl (PATADAY OP) Place 2 drops into both eyes daily as needed (allergies).   Yes [provider]  ondansetron  (ZOFRAN ) 4 MG tablet Take 4 mg by mouth as needed for nausea or vomiting. 06/15/23  Yes [provider]  OXYGEN Inhale 3 L into the lungs See admin instructions. Inhale 3L of Oxygen everynight at bedside. May uses throughout the day as needed.   Yes [provider]  pantoprazole  (PROTONIX ) 40 MG tablet Take 1 tablet (40 mg total) by mouth 2 (two) times daily. 04/12/21  Yes Angiulli, Daniel J, PA-C  polyethylene glycol powder (GLYCOLAX /MIRALAX ) 17 GM/SCOOP powder Take 17 g by mouth as needed for mild constipation or moderate constipation. 11/15/20  Yes [provider]  traZODone  (DESYREL ) 50 MG tablet Take 75 mg by mouth at bedtime as needed for sleep. 05/11/23  Yes [provider]  TRESIBA  FLEXTOUCH 100 UNIT/ML FlexTouch Pen Inject 25 Units into the skin in the morning. 04/14/24  Yes Fairy Frames, MD  zinc  sulfate 220 (50 Zn) MG capsule Take 1 capsule (220 mg total) by mouth daily. Patient taking differently: Take 220 mg by mouth in the morning. 04/12/21  Yes Angiulli, Toribio PARAS, PA-C  dextromethorphan  (DELSYM ) 30 MG/5ML liquid Take 2.5 mLs (15 mg total) by mouth 2 (two) times daily. Patient not taking: Reported on 07/23/2024 12/29/23   Tobie Yetta HERO, MD  HYDROcodone  bit-homatropine [facility]) 5-1.5 MG/5ML syrup Take 5 mLs by mouth every 6 (six) hours as needed for cough. Patient not taking: Reported on 07/23/2024 12/29/23   Tobie Yetta HERO, MD  HYDROcodone -acetaminophen  (NORCO/VICODIN) 5-325 MG tablet Take 1 tablet by mouth at bedtime as needed.    [provider]  methocarbamol  (ROBAXIN ) 500 MG tablet Take 1 tablet (500 mg total) by mouth  every 8 (eight) hours as needed for muscle spasms. Patient not taking: Reported on 07/23/2024 12/29/23   Tobie Yetta HERO, MD    Scheduled Meds:  aspirin   325 mg Oral Daily   insulin  aspart  0-9 Units Subcutaneous Q4H   Continuous Infusions:  sodium chloride      sodium chloride  125 mL/hr at 07/24/24 0053   ceFEPime  (MAXIPIME ) IV     famotidine  (PEPCID ) IV     heparin      magnesium  sulfate bolus IVPB 2 g (07/24/24 0115)   metronidazole      vancomycin      PRN Meds: diphenhydrAMINE , metoCLOPramide  (REGLAN ) injection  Allergies:    Allergies  Allergen Reactions   Codeine Hives and Itching   Ofev  [Nintedanib ] Diarrhea    SEVERE DIARRHEA   Pirfenidone  Diarrhea and Other (See Comments)    Esbriet  (Pirfenidone ) causes elevated LFTs. D/C on 06/14/17 and SEVERE DIARRHEA    Amoxicillin Itching    Social History:   Social History   Socioeconomic History   Marital status: Married    Spouse name: Not on file   Number of children: Not on file   Years of education: Not on file   Highest education level: Not on file  Occupational History   Not on file  Tobacco Use   Smoking status: Never    Passive exposure: Past   Smokeless tobacco: Never  Vaping Use   Vaping status: Never Used  Substance and Sexual Activity   Alcohol use: No   Drug use: No   Sexual activity: Not Currently  Other Topics Concern   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    Social Drivers of Corporate investment banker Strain: Low Risk  (08/22/2023)   Received from [facility]   Overall Financial Resource Strain (CARDIA)    Difficulty of Paying Living Expenses: Not hard at all  Food Insecurity: No Food Insecurity (04/10/2024)  Hunger Vital Sign    Worried About Running Out of Food in the Last Year: Never true    Ran Out of Food in the Last Year: Never true  Transportation Needs: No Transportation Needs (04/10/2024)   PRAPARE - Scientist, research (physical sciences) (Medical): No    Lack of Transportation (Non-Medical): No  Physical Activity: Not on file  Stress: Not on file  Social Connections: Moderately Integrated (04/10/2024)   Social Connection and Isolation Panel    Frequency of Communication with Friends and Family: More than three times a week    Frequency of Social Gatherings with Friends and Family: Three times a week    Attends Religious Services: Never    Active Member of Clubs or Organizations: Yes    Attends Banker Meetings: Never    Marital Status: Married  Catering manager Violence: Not At Risk (04/10/2024)   Humiliation, Afraid, Rape, and Kick questionnaire    Fear of Current or Ex-Partner: No    Emotionally Abused: No    Physically Abused: No    Sexually Abused: No    Family History:    Family History  Problem Relation Age of Onset   Diabetes Mellitus II Mother    Emphysema Father 28   Heart attack Father    Colon cancer Neg Hx    Esophageal cancer Neg Hx    Rectal cancer Neg Hx    Stomach cancer Neg Hx      ROS:  Please see the history of present illness.   All other ROS reviewed and negative.     Physical Exam/Data: Vitals:   07/24/24 0100 07/24/24 0115 07/24/24 0130 07/24/24 0145  BP:  109/64 105/62 (!) 104/55  Pulse: 78 72 69 68  Resp:  13 18 15   Temp:      TempSrc:      SpO2: 100% 100% 100% 100%  Weight:      Height:        Intake/Output Summary (Last 24 hours) at 07/24/2024 0151 Last data filed at 07/24/2024 0040 Gross per 24 hour  Intake 3922.5 ml  Output --  Net 3922.5 ml      07/24/2024   12:00 AM 05/09/2024    2:31 PM 04/14/2024    5:00 AM  Last 3 Weights  Weight (lbs) 229 lb 4.5 oz 232 lb 12.8 oz 227 lb 4.7 oz  Weight (kg) 104 kg 105.597 kg 103.1 kg     Body mass index is 31.1 kg/m.  General:  Fatigued appearing, somnolent HEENT: normal Neck: unable to assess JVD due to substantial neck adiposity Vascular: No carotid bruits; Distal pulses 2+  bilaterally Cardiac:  normal S1, S2; RRR;  Lungs: fine crackles bilaterally Abd: soft, nontender, no hepatomegaly  Ext: no edema Musculoskeletal:  Right BKA apparent, BUE and BLE strength normal and equal Skin: warm and dry  Neuro:  CNs 2-12 intact, no focal abnormalities noted Psych:  Normal affect   EKG:  The EKG was personally reviewed and demonstrates:  NSR, RBBB, t wave changes Telemetry:  Telemetry was personally reviewed and demonstrates:  NSR, RBBB  Relevant CV Studies: Echo 04/11/24  1. Left ventricular ejection fraction, by estimation, is 35 to 40%. Left  ventricular ejection fraction by 2D MOD biplane is 39.9 %. The left  ventricle has moderately decreased function. The left ventricle  demonstrates regional wall motion abnormalities  (see scoring diagram/findings for description). There is severe concentric  left ventricular hypertrophy. Left ventricular diastolic  parameters are  indeterminate.   2. Right ventricular systolic function is normal. The right ventricular  size is normal.   3. The mitral valve is normal in structure. No evidence of mitral valve  regurgitation. No evidence of mitral stenosis.   4. The aortic valve has been repaired/replaced. Aortic valve  regurgitation is not visualized. No aortic stenosis is present. There is a  29 mm native pulmonic valve present in the aortic position.   5. The inferior vena cava is normal in size with greater than 50%  respiratory variability, suggesting right atrial pressure of 3 mmHg.   Laboratory Data: High Sensitivity Troponin:   Recent Labs  Lab 07/23/24 1731 07/23/24 1938 07/23/24 2353  TROPONINIHS 18* 93* 3,304*     Chemistry Recent Labs  Lab 07/23/24 1731 07/23/24 1737 07/23/24 1831 07/23/24 2353 07/24/24 0019  NA 134* 137 136  --  133*  K 4.9 4.8 4.7  --  5.3*  CL 95*  --  101  --   --   CO2 23  --   --   --   --   GLUCOSE 158*  --  153*  --   --   BUN 53*  --  76*  --   --   CREATININE 1.67*  --   1.70*  --   --   CALCIUM  8.1*  --   --   --   --   MG  --   --   --  1.5*  --   GFRNONAA 40*  --   --   --   --   ANIONGAP 16*  --   --   --   --     Recent Labs  Lab 07/23/24 1731  PROT 6.9  ALBUMIN  3.1*  AST 39  ALT 11  ALKPHOS 98  BILITOT 1.7*   Lipids No results for input(s): CHOL, TRIG, HDL, LABVLDL, LDLCALC, CHOLHDL in the last 168 hours.   Hematology Recent Labs  Lab 07/23/24 1731 07/23/24 1737 07/23/24 1831 07/24/24 0019  WBC 15.1*  --   --   --   RBC 4.80  --   --   --   HGB 14.7 15.0 15.6 11.9*  HCT 44.8 44.0 46.0 35.0*  MCV 93.3  --   --   --   MCH 30.6  --   --   --   MCHC 32.8  --   --   --   RDW 13.1  --   --   --   PLT 245  --   --   --    Thyroid  Recent Labs  Lab 07/23/24 1906 07/23/24 2353  TSH 6.678*  --   FREET4  --  1.00    BNPNo results for input(s): BNP, PROBNP in the last 168 hours.  DDimer No results for input(s): DDIMER in the last 168 hours.  Radiology/Studies:  CT Angio Chest PE W and/or Wo Contrast Result Date: 07/23/2024 CLINICAL DATA:  Chest pain and shortness of breath. Concern for pulmonary disease. EXAM: CT ANGIOGRAPHY CHEST WITH CONTRAST TECHNIQUE: Multidetector CT imaging of the chest was performed using the standard protocol during bolus administration of intravenous contrast. Multiplanar CT image reconstructions and MIPs were obtained to evaluate the vascular anatomy. RADIATION DOSE REDUCTION: This exam was performed according to the departmental dose-optimization program which includes automated exposure control, adjustment of the mA and/or kV according to patient size and/or use of iterative reconstruction technique. CONTRAST:  75mL OMNIPAQUE   IOHEXOL  350 MG/ML SOLN COMPARISON:  Chest CT dated 05/31/2024. FINDINGS: Evaluation of this exam is limited due to respiratory motion. Cardiovascular: There is mild cardiomegaly. No pericardial effusion. There is coronary vascular calcification. Aortic valve repair. Mild  atherosclerotic calcification of the thoracic aorta. No aneurysmal dilatation. Evaluation of the pulmonary arteries is limited due to respiratory motion as well as streak artifact caused by patient's arms. No porta artery embolus identified. Mediastinum/Nodes: Mildly enlarged right hilar lymph nodes measure 11 mm. The esophagus is grossly unremarkable no mediastinal fluid collection. Lungs/Pleura: Diffuse interstitial coarsening and fibrosis in keeping with interstitial lung disease. Small left pleural effusion. No focal consolidation or pneumothorax. The central airways are patent. Upper Abdomen: No acute abnormality. Musculoskeletal: Osteopenia with degenerative changes. Median sternotomy wires. No acute osseous pathology. Review of the MIP images confirms the above findings. IMPRESSION: 1. No CT evidence of pulmonary artery embolus. 2. Interstitial lung disease. 3. Small left pleural effusion. 4.  Aortic Atherosclerosis (ICD10-I70.0). Electronically Signed   By: Vanetta Chou M.D.   On: 07/23/2024 19:31   CT ABDOMEN PELVIS W CONTRAST Result Date: 07/23/2024 CLINICAL DATA:  Nausea and vomiting EXAM: CT ABDOMEN AND PELVIS WITH CONTRAST TECHNIQUE: Multidetector CT imaging of the abdomen and pelvis was performed using the standard protocol following bolus administration of intravenous contrast. RADIATION DOSE REDUCTION: This exam was performed according to the departmental dose-optimization program which includes automated exposure control, adjustment of the mA and/or kV according to patient size and/or use of iterative reconstruction technique. CONTRAST:  75mL OMNIPAQUE  IOHEXOL  350 MG/ML SOLN COMPARISON:  CT abdomen and pelvis 12/30/2023. FINDINGS: Lower chest: Chronic interstitial changes are again noted in the lung bases. The heart is enlarged. Hepatobiliary: No focal liver abnormality is seen. No gallstones, gallbladder wall thickening, or biliary dilatation. Pancreas: Unremarkable. No pancreatic ductal  dilatation or surrounding inflammatory changes. Spleen: Normal in size without focal abnormality. Adrenals/Urinary Tract: There is no hydronephrosis or perinephric stranding. No perinephric fluid collection identified. Knee adrenal glands and bladder are within normal limits. Stomach/Bowel: There are mildly dilated central small bowel loops. No wall thickening or inflammation. No transition point visualized. The colon is nondilated. There is diffuse colonic diverticulosis. The appendix is normal. Stomach is within normal limits. Vascular/Lymphatic: Aortic atherosclerosis. No enlarged abdominal or pelvic lymph nodes. Reproductive: Prostate is unremarkable. Other: There is no free air. There is trace free fluid in the left upper quadrant previously identified right retroperitoneal hematoma has significantly decreased in size. Enhancing low-attenuation areas seen in the right retroperitoneum measuring 9.1 x 2.7 x 5.3 cm. There is no focal abdominal wall hernia. Musculoskeletal: Degenerative changes affect the spine the hips. There are laminectomy defects at L3-L4. IMPRESSION: 1. Mildly dilated central small bowel loops without transition point. Findings may represent ileus or partial small bowel obstruction. 2. Trace free fluid in the left upper quadrant. 3. Enhancing low-attenuation collection in the right retroperitoneum is most likely related to resolving hematoma. Infection not excluded. 4. Colonic diverticulosis. Aortic Atherosclerosis (ICD10-I70.0). Electronically Signed   By: Greig Pique M.D.   On: 07/23/2024 19:30   DG Chest Portable 1 View Result Date: 07/23/2024 CLINICAL DATA:  Short of breath, vomiting EXAM: PORTABLE CHEST 1 VIEW COMPARISON:  04/11/2024 FINDINGS: Two frontal views of the chest demonstrate stable postsurgical changes from CABG and aortic valve replacement. Cardiac silhouette remains enlarged. Chronic background pulmonary scarring and fibrosis, with increased pulmonary vascular congestion  and patchy bibasilar consolidation consistent with superimposed edema. Small bilateral effusions. No pneumothorax. IMPRESSION: 1. Congestive  heart failure superimposed upon chronic pulmonary fibrosis and scarring. Electronically Signed   By: Ozell Daring M.D.   On: 07/23/2024 18:24   Assessment and Plan: NSTEMI - Elevated troponin with acute rise in the absence of chest pain shortly after receiving epinephrine  for hypotension makes type II the more likely etiology but his pre-test probability is high given history of CABG. He has tolerated DOAC and ASA with resolving RP hematoma on imaging so the risk of a full dose ASA is likely minimal (already receiving heparin  gtt for hx of PE). We can further risk stratify him with Echocardiogram in AM and consider catheterization. He also has a partial SBO and so proceeding with cath will need to be discussed with primary team. Severe concentric LVH - Seen on most recent Echocardiogram with ECG illustrating borderline low voltage. 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  - Serum IFE  - Urine IFE  - Free light chains  - Needs cardiac MRI or PYP scan when able to tolerate Sinus node dysfunction s/p PM Paroxysmal AF - continue AC Severe AS s/p TAVR - Echo for surveillance of valve Syncope - likely related to hypotensive blood pressures, will need device interrogation to evaluate for rhythm disturbance  Risk Assessment/Risk Scores:    TIMI Risk Score for Unstable Angina or Non-ST Elevation MI:   The patient's TIMI risk score is  , which indicates a  % risk of all cause mortality, new or recurrent myocardial infarction or need for urgent revascularization in the next 14 days.    CHA2DS2-VASc Score =   6   This indicates a 9.7 % annual risk of stroke. The patient's score is based upon: CHF, Age > 75, PAD, T2DM          For questions or updates, please contact [facility] Please consult [email or web address] for  contact info under    Signed, Jalaine DELENA Newcomer, MD  07/24/2024 1:51 AM

## 2024-07-24 NOTE — ED Notes (Signed)
 Pt to floor with NT. Floor aware.

## 2024-07-24 NOTE — ED Notes (Signed)
NG tube hooked back up to intermittent suction 

## 2024-07-24 NOTE — ED Notes (Signed)
 Phlebotomy to come back to try for labs again.

## 2024-07-24 NOTE — Progress Notes (Addendum)
 Pharmacy Antibiotic Note  Shane [name] is a 83 y.o. male admitted on 07/23/2024 with emesis/hypotension/SOB with concerns for sepsis.  Pharmacy has been consulted for cefepime /vancomycin  dosing.   -CTa: resolving RP hematoma as well as a partial bowel obstruction -WBC 15, sCr 1.7 (~bl), lactate 4.4 > 2.8, afebrile -Blood cultures collected  Plan: -Cefepime  2g IV every 12 hours -Vancomycin  2g IV x1 -Vancomycin  750mg  IV every 24 hours (AUC 419, Vd 0.5, IBW, sCr 1.7) -Flagyl  500mg  IV every 12 hours -Monitor renal function -Follow up surgery consult -Follow up signs of clinical improvement, LOT, de-escalation of antibiotics      Temp (24hrs), Avg:97.9 F (36.6 C), Min:97.1 F (36.2 C), Max:98.4 F (36.9 C)  Recent Labs  Lab 07/23/24 1705 07/23/24 1731 07/23/24 1831 07/23/24 2005 07/23/24 2206 07/24/24 0002  WBC  --  15.1*  --   --   --   --   CREATININE  --  1.67* 1.70*  --   --   --   LATICACIDVEN 4.1*  --   --  7.4* 4.4* 2.8*    CrCl cannot be calculated (Unknown ideal weight.).    Allergies  Allergen Reactions   Codeine Hives and Itching   Ofev  [Nintedanib ] Diarrhea    SEVERE DIARRHEA   Pirfenidone  Diarrhea and Other (See Comments)    Esbriet  (Pirfenidone ) causes elevated LFTs. D/C on 06/14/17 and SEVERE DIARRHEA    Amoxicillin Itching    Antimicrobials this admission: Cefepime  8/18 >>  Vancomycin  8/18 >>  Flagyl  8/18 >>  Microbiology results: 8/18 BCx:   Thank you for allowing pharmacy to be a part of this patient's care.  Lynwood Poplar, PharmD, BCPS Clinical Pharmacist 07/24/2024 12:47 AM

## 2024-07-24 NOTE — Assessment & Plan Note (Addendum)
 On heparin  Appreciate cardiology consult

## 2024-07-24 NOTE — Progress Notes (Signed)
 PHARMACY - ANTICOAGULATION CONSULT NOTE  Pharmacy Consult for heparin  Indication: atrial fibrillation, hx of PE  Allergies  Allergen Reactions   Codeine Hives and Itching   Ofev  [Nintedanib ] Diarrhea    SEVERE DIARRHEA   Pirfenidone  Diarrhea and Other (See Comments)    Esbriet  (Pirfenidone ) causes elevated LFTs. D/C on 06/14/17 and SEVERE DIARRHEA    Amoxicillin Itching    Patient Measurements: Height: 6' (182.9 cm) Weight: 104 kg (229 lb 4.5 oz) (Wt from 06/25/2024) IBW/kg (Calculated) : 77.6 HEPARIN  DW (KG): 99.1  Vital Signs: Temp: 98.4 F (36.9 C) (08/19 2245) Temp Source: Rectal (08/19 1849) BP: 100/55 (08/19 2245) Pulse Rate: 65 (08/19 2245)  Labs: Recent Labs    07/23/24 1731 07/23/24 1737 07/23/24 1831 07/23/24 1938 07/24/24 0019  HGB 14.7 15.0 15.6  --  11.9*  HCT 44.8 44.0 46.0  --  35.0*  PLT 245  --   --   --   --   CREATININE 1.67*  --  1.70*  --   --   TROPONINIHS 18*  --   --  93*  --     Estimated Creatinine Clearance: 41.1 mL/min (A) (by C-G formula based on SCr of 1.7 mg/dL (H)).  Medications:  -Eliquis  5mg  PO BID (Last dose 8/19 AM)  Assessment: 89 yoM presented to ED with emesis, hypotension, and SOB. Pharmacy consulted to dose heparin  for afib/hx of PE. 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   -5/25: NM perfusion indicated high probability of PE, CTa this admission showing no acute PE -CT abdomen: ileus vs partial SBO + resolving hematoma in R. Retroperitoneum  -CBC shows Hgb 15 > 11.9, plts WNL  Goal of Therapy:  Heparin  level 0.3-0.7 units/ml aPTT 66-102 seconds Monitor platelets by anticoagulation protocol: Yes   Plan:  No bolus given recent eliquis  use Start heparin  infusion at 1400 units/hr Check aPTT/anti-Xa level in 8 hours and daily while on heparin  Monitor with aPTT until correlates with heparin  level Continue to monitor H&H and platelets Follow up  transition back to Eliquis    Lynwood Poplar, PharmD, BCPS Clinical Pharmacist 07/24/2024 1:23 AM

## 2024-07-24 NOTE — Assessment & Plan Note (Signed)
 Chronic stable no fever or chills but noted white blood cell count elevation CT scan equivocal for tonight continue antibiotics given episodes of hypotension

## 2024-07-24 NOTE — Assessment & Plan Note (Addendum)
 Appreciate general surgery consult currently nausea vomiting has improved Keep NPO for now  Repeat KUB in AM

## 2024-07-24 NOTE — Assessment & Plan Note (Signed)
-   Order Sensitive SSI   - continue home insulin but decreased to  15 units,  -  check TSH and HgA1C  - Hold by mouth medications

## 2024-07-24 NOTE — Hospital Course (Addendum)
 83 y.o. 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     In the ER c/o SOB, noted to have rash and hypotension, treated for presumed anaphylaxis.  Troponins jumped to 3000s.  CTA ruled out PE.  CT abdomen showed pSBO, NG placed.  Cards and Gen Surg consulted.

## 2024-07-24 NOTE — Assessment & Plan Note (Signed)
-  chronic avoid nephrotoxic medications such as NSAIDs, Vanco Zosyn combo,  avoid hypotension, continue to follow renal function

## 2024-07-24 NOTE — Plan of Care (Addendum)
  RN reported that-- Shane [name] has SBO and an NG tube in and wants to wear his cpap and get a sleep medication, pls review home meds, Benadryl  does not work, would like the NG out but SBO is not resolved, also can he have water or food, I flished his NG with 10 ml water and pt dry heaved - all meds are IV so I explained that po meds or food is nt the best plan right now. Wife and patient upset and wanted to leave but they are accepting care now   Patient requesting the NG tube, start oral diet, oral medications and want to add his CPAP on patient wife and he is very upset and wanted to leavebecause he is not getting oral food and medications.  Patient has been admitted for partial small bowel obstruction.  Imaging at 5 PM 8/20 showing unchanged dilated small bowel loop concerning for ongoing obstruction. -Cannot start CPAP in the setting of NG tube as high risk of aspiration. -At this time it would not be appropriate to remove the NG tube as patient has ongoing obstruction. In order to avoid surgery the NG tube is the most appropriate conservative management. - Will continue maintenance fluid while patient is NPO. - As patient requesting for sleep medication trazodone  and Benadryl  is not helping him in and instructed nurse to give trazodone  50 mg after that hold the NG tube suction for 30-minute and then restarted. -If patient is not satisfied and wants to leave against our medical advice he needs to sign AMA paperwork.

## 2024-07-24 NOTE — Assessment & Plan Note (Signed)
 Hold off on CPAP for tonight

## 2024-07-24 NOTE — Assessment & Plan Note (Signed)
 Known history of CABG and CKD

## 2024-07-24 NOTE — Progress Notes (Signed)
 Echocardiogram 2D Echocardiogram has been performed.  Shane [name] 07/24/2024, 11:54 AM

## 2024-07-24 NOTE — Assessment & Plan Note (Signed)
 Appreciate cardiology consult may need cardiac catheterization in a.m.

## 2024-07-25 DIAGNOSIS — I214 Non-ST elevation (NSTEMI) myocardial infarction: Secondary | ICD-10-CM | POA: Diagnosis not present

## 2024-07-25 DIAGNOSIS — I482 Chronic atrial fibrillation, unspecified: Secondary | ICD-10-CM | POA: Diagnosis not present

## 2024-07-25 DIAGNOSIS — R7989 Other specified abnormal findings of blood chemistry: Secondary | ICD-10-CM

## 2024-07-25 DIAGNOSIS — E119 Type 2 diabetes mellitus without complications: Secondary | ICD-10-CM | POA: Diagnosis not present

## 2024-07-25 DIAGNOSIS — K56609 Unspecified intestinal obstruction, unspecified as to partial versus complete obstruction: Secondary | ICD-10-CM | POA: Diagnosis not present

## 2024-07-25 LAB — GLUCOSE, CAPILLARY
Glucose-Capillary: 137 mg/dL — ABNORMAL HIGH (ref 70–99)
Glucose-Capillary: 142 mg/dL — ABNORMAL HIGH (ref 70–99)
Glucose-Capillary: 150 mg/dL — ABNORMAL HIGH (ref 70–99)
Glucose-Capillary: 150 mg/dL — ABNORMAL HIGH (ref 70–99)
Glucose-Capillary: 155 mg/dL — ABNORMAL HIGH (ref 70–99)
Glucose-Capillary: 160 mg/dL — ABNORMAL HIGH (ref 70–99)
Glucose-Capillary: 187 mg/dL — ABNORMAL HIGH (ref 70–99)

## 2024-07-25 LAB — COMPREHENSIVE METABOLIC PANEL WITH GFR
ALT: 21 U/L (ref 0–44)
AST: 48 U/L — ABNORMAL HIGH (ref 15–41)
Albumin: 2.6 g/dL — ABNORMAL LOW (ref 3.5–5.0)
Alkaline Phosphatase: 62 U/L (ref 38–126)
Anion gap: 10 (ref 5–15)
BUN: 44 mg/dL — ABNORMAL HIGH (ref 8–23)
CO2: 23 mmol/L (ref 22–32)
Calcium: 8.2 mg/dL — ABNORMAL LOW (ref 8.9–10.3)
Chloride: 104 mmol/L (ref 98–111)
Creatinine, Ser: 1.61 mg/dL — ABNORMAL HIGH (ref 0.61–1.24)
GFR, Estimated: 42 mL/min — ABNORMAL LOW (ref >60)
Glucose, Bld: 150 mg/dL — ABNORMAL HIGH (ref 70–99)
Potassium: 4.1 mmol/L (ref 3.5–5.1)
Sodium: 137 mmol/L (ref 135–145)
Total Bilirubin: 0.7 mg/dL (ref 0.0–1.2)
Total Protein: 6 g/dL — ABNORMAL LOW (ref 6.5–8.1)

## 2024-07-25 LAB — APTT
aPTT: 122 s — ABNORMAL HIGH (ref 24–36)
aPTT: 57 s — ABNORMAL HIGH (ref 24–36)
aPTT: >200 s (ref 24–36)

## 2024-07-25 LAB — HEPARIN LEVEL (UNFRACTIONATED)
Heparin Unfractionated: 1 [IU]/mL — ABNORMAL HIGH (ref 0.30–0.70)
Heparin Unfractionated: >1.1 [IU]/mL — ABNORMAL HIGH (ref 0.30–0.70)

## 2024-07-25 LAB — IMMUNOFIXATION ELECTROPHORESIS
IgA: 499 mg/dL — ABNORMAL HIGH (ref 61–437)
IgG (Immunoglobin G), Serum: 883 mg/dL (ref 603–1613)
IgM (Immunoglobulin M), Srm: 31 mg/dL (ref 15–143)
Total Protein ELP: 5.3 g/dL — ABNORMAL LOW (ref 6.0–8.5)

## 2024-07-25 LAB — T3: T3, Total: 86 ng/dL (ref 71–180)

## 2024-07-25 MED ORDER — ALUM & MAG HYDROXIDE-SIMETH 200-200-20 MG/5ML PO SUSP
30.0000 mL | Freq: Four times a day (QID) | ORAL | Status: DC | PRN
  Administered 2024-07-25: 30 mL via ORAL
  Filled 2024-07-25: qty 30

## 2024-07-25 MED ORDER — FUROSEMIDE 40 MG PO TABS
40.0000 mg | ORAL_TABLET | Freq: Every day | ORAL | Status: DC
  Administered 2024-07-25 – 2024-07-26 (×2): 40 mg via ORAL
  Filled 2024-07-25 (×2): qty 1

## 2024-07-25 MED ORDER — ASPIRIN 81 MG PO TBEC
81.0000 mg | DELAYED_RELEASE_TABLET | Freq: Every day | ORAL | Status: DC
  Administered 2024-07-25 – 2024-07-26 (×2): 81 mg via ORAL
  Filled 2024-07-25 (×2): qty 1

## 2024-07-25 MED ORDER — ATORVASTATIN CALCIUM 80 MG PO TABS
80.0000 mg | ORAL_TABLET | Freq: Every day | ORAL | Status: DC
Start: 2024-07-25 — End: 2024-07-26
  Administered 2024-07-25: 80 mg via ORAL
  Filled 2024-07-25: qty 1

## 2024-07-25 MED ORDER — ESCITALOPRAM OXALATE 10 MG PO TABS
10.0000 mg | ORAL_TABLET | Freq: Every day | ORAL | Status: DC
  Administered 2024-07-25 – 2024-07-26 (×2): 10 mg via ORAL
  Filled 2024-07-25 (×2): qty 1

## 2024-07-25 MED ORDER — GABAPENTIN 100 MG PO CAPS
100.0000 mg | ORAL_CAPSULE | Freq: Three times a day (TID) | ORAL | Status: DC
  Administered 2024-07-25 – 2024-07-26 (×4): 100 mg via ORAL
  Filled 2024-07-25 (×4): qty 1

## 2024-07-25 NOTE — Plan of Care (Signed)

## 2024-07-25 NOTE — Evaluation (Signed)
 Physical Therapy Evaluation Patient Details Name: Shane [name] MRN: [id number] DOB: 07/04/1941 Today's Date: 07/25/2024  History of Present Illness  Patient is a 83 y.o. male who presented to the ED with vomiting and shortness of breath-he was found to have hypotension-skin rash consistent with hives-he was thought to have anaphylaxis-and given epinephrine  with significant improvement.  However he was also found to have bowel obstruction. Past medical history of HFrEF, CAD s/p CABG 2021, aortic stenosis-s/p TAVR 2024, PAF, sick sinus syndrome-s/p leadless pacemaker, IPF on home O2, PE (May 2025) on anticoagulation.  Clinical Impression  Pt presents with admitting diagnosis above. Co-treat with OT. +2 Min A to stand for pericare. Progressing to +2 Mod A once fatigued. OT blocked pt RLE due to prosthesis not fitting properly while NT performed pericare. Pt able to scoot to [facility] with supervision. Pt HR noted to be in VTach during session with HR up to 139. Pt reported difficulty breathing during mobility with RR up to 44 despite SpO2 being near 100% on 4L. RN called into room with pt vital signs eventually stabling. PTA pt reports that he mostly used a WC or electric scooter for mobility and was quite active still managing rental properties. Recommend HHPT upon DC. PT will continue to follow.         If plan is discharge home, recommend the following: A little help with walking and/or transfers;A little help with bathing/dressing/bathroom;Assistance with cooking/housework;Direct supervision/assist for medications management;Assist for transportation;Help with stairs or ramp for entrance   Can travel by private vehicle        Equipment Recommendations None recommended by PT  Recommendations for Other Services       Functional Status Assessment Patient has had a recent decline in their functional status and demonstrates the ability to make significant improvements in function in a reasonable and  predictable amount of time.     Precautions / Restrictions Precautions Precautions: Fall;Other (comment) Precaution/Restrictions Comments: R BKA and L TMA with wounds Restrictions Weight Bearing Restrictions Per Provider Order: No      Mobility  Bed Mobility Overal bed mobility: Modified Independent                  Transfers Overall transfer level: Needs assistance Equipment used: Rolling walker (2 wheels) Transfers: Sit to/from Stand Sit to Stand: +2 physical assistance, Min assist, Mod assist           General transfer comment: +2 Min A to stand for pericare. Progressing to +2 Mod A once fatigued. OT blocked pt RLE due to prosthesis not fitting properly while NT performed pericare. Pt able to scoot to [facility] with supervision.    Ambulation/Gait               General Gait Details: Deferred. Pt mostly WC level at baseline  Stairs            Wheelchair Mobility     Tilt Bed    Modified Rankin (Stroke Patients Only)       Balance Overall balance assessment: Needs assistance   Sitting balance-Leahy Scale: Good     Standing balance support: Bilateral upper extremity supported Standing balance-Leahy Scale: Poor                               Pertinent Vitals/Pain Pain Assessment Pain Assessment: No/denies pain    Home Living Family/patient expects to be discharged to:: Private residence Living Arrangements: Spouse/significant  other Available Help at Discharge: Family;Available PRN/intermittently Type of Home: House Home Access: Ramped entrance       Home Layout: Two level;Able to live on main level with bedroom/bathroom Home Equipment: Shower seat;Rolling Walker (2 wheels);Electric scooter;Wheelchair - manual;Cane - single point;Other (comment);Grab bars - tub/shower;Grab bars - toilet;Adaptive equipment      Prior Function Prior Level of Function : Independent/Modified Independent;Working/employed              Mobility Comments: Pt reports he doesn't walk much, but can complete short distances using RW and R BKA prosthesis. Typically will transfer to w/c or scooter. 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 States he can navigate stairs with a rail. Pt reports a couple of LOB but only 1 fall that led to admission. ADLs Comments: ModI with ADLs. Wife does IADLs. He can drive, but doesn't     Extremity/Trunk Assessment   Upper Extremity Assessment Upper Extremity Assessment: Overall WFL for tasks assessed (Simultaneous filing. User may not have seen previous data.)    Lower Extremity Assessment Lower Extremity Assessment: RLE deficits/detail;LLE deficits/detail RLE Deficits / Details: R BKA LLE Deficits / Details: L TMA with wounds    Cervical / Trunk Assessment Cervical / Trunk Assessment: Normal (.)  Communication   Communication Communication: No apparent difficulties    Cognition Arousal: Alert Behavior During Therapy: WFL for tasks assessed/performed   PT - Cognitive impairments: No apparent impairments                         Following commands: Intact       Cueing Cueing Techniques: Verbal cues, Tactile cues     General Comments General comments (skin integrity, edema, etc.): Pt HR noted to be in VTach during session with HR up to 139. Pt reported difficulty breathing during mobility with RR up to 44 despite SpO2 being near 100% on 4L. RN called into room with pt vital signs eventually stabling.    Exercises     Assessment/Plan    PT Assessment Patient needs continued PT services  PT Problem List Decreased strength;Decreased range of motion;Decreased activity tolerance;Decreased balance;Decreased mobility;Decreased coordination;Decreased knowledge of use of DME;Decreased safety awareness;Cardiopulmonary status limiting activity;Decreased knowledge of precautions       PT Treatment Interventions DME instruction;Gait training;Stair  training;Functional mobility training;Therapeutic activities;Therapeutic exercise;Balance training;Neuromuscular re-education;Patient/family education;Wheelchair mobility training    PT Goals (Current goals can be found in the Care Plan section)  Acute Rehab PT Goals Patient Stated Goal: to get better PT Goal Formulation: With patient Time For Goal Achievement: 08/08/24 Potential to Achieve Goals: Fair    Frequency Min 2X/week     Co-evaluation   Reason for Co-Treatment: For patient/therapist safety   OT goals addressed during session: ADL's and self-care       AM-PAC PT 6 Clicks Mobility  Outcome Measure Help needed turning from your back to your side while in a flat bed without using bedrails?: None Help needed moving from lying on your back to sitting on the side of a flat bed without using bedrails?: None Help needed moving to and from a bed to a chair (including a wheelchair)?: A Little Help needed standing up from a chair using your arms (e.g., wheelchair or bedside chair)?: A Lot Help needed to walk in hospital room?: Total Help needed climbing 3-5 steps with a railing? : Total 6 Click Score: 15    End of Session Equipment Utilized During Treatment:  Oxygen Activity Tolerance: Patient tolerated treatment well;Patient limited by fatigue Patient left: in bed;with call bell/phone within reach;with bed alarm set;Other (comment) (Pt in chair position) Nurse Communication: Mobility status PT Visit Diagnosis: Other abnormalities of gait and mobility (R26.89)    Time: 8588-8552 PT Time Calculation (min) (ACUTE ONLY): 36 min   Charges:   PT Evaluation $PT Eval Moderate Complexity: 1 Mod   PT General Charges $$ ACUTE PT VISIT: 1 Visit         Sueellen NOVAK, PT, DPT Acute Rehab Services [phone]   Kyrene Longan 07/25/2024, 4:28 PM

## 2024-07-25 NOTE — Plan of Care (Signed)
 Patient and his wife both are very stubborn and do not like the idea of to keep the NG tube eventually at the 3 AM patient removed the NG tube.  SBO has not been resolved yet. Family wanted to leave AMA earlier in the evening however later on they changed their mind and planning to stay.  Patient is requesting for food. -Please inform general surgery in the morning to decide to replace the NG tube versus patient needs surgical intervention for persistent SBO.

## 2024-07-25 NOTE — Progress Notes (Signed)
 PHARMACY - ANTICOAGULATION CONSULT NOTE  Pharmacy Consult for heparin  Indication: atrial fibrillation, hx of PE  Allergies  Allergen Reactions   Codeine Hives and Itching   Ofev  [Nintedanib ] Diarrhea    SEVERE DIARRHEA   Pirfenidone  Diarrhea and Other (See Comments)    Esbriet  (Pirfenidone ) causes elevated LFTs. D/C on 06/14/17 and SEVERE DIARRHEA    Amoxicillin Itching    Patient Measurements: Height: 6' (182.9 cm) Weight: 104 kg (229 lb 4.5 oz) (Wt from 06/25/2024) IBW/kg (Calculated) : 77.6 HEPARIN  DW (KG): 99.1  Vital Signs: Temp: 97.6 F (36.4 C) (08/21 0009) Temp Source: Oral (08/20 1723) BP: 111/61 (08/21 0007) Pulse Rate: 67 (08/21 0009)  Labs: Recent Labs    07/23/24 1731 07/23/24 1737 07/23/24 1831 07/23/24 1938 07/23/24 2353 07/24/24 0019 07/24/24 0158 07/24/24 1000 07/24/24 1036 07/24/24 1100 07/24/24 1412 07/24/24 1651 07/25/24 0022  HGB 14.7   < > 15.6  --   --  11.9*  --   --  11.9*  --   --   --   --   HCT 44.8   < > 46.0  --   --  35.0*  --   --  36.8*  --   --   --   --   PLT 245  --   --   --   --   --   --   --  170  --   --   --   --   APTT  --   --   --   --   --   --   --   --   --  150*  --   --  122*  HEPARINUNFRC  --   --   --   --   --   --   --  >1.10*  --   --   --   --  >1.10*  CREATININE 1.67*  --  1.70*  --   --   --   --   --   --   --   --   --   --   CKTOTAL  --   --   --   --  307  --   --   --   --   --   --   --   --   TROPONINIHS 18*  --   --    < > 3,304*  --    < >  --  5,053*  --  5,648* 4,867*  --    < > = values in this interval not displayed.    Estimated Creatinine Clearance: 41.1 mL/min (A) (by C-G formula based on SCr of 1.7 mg/dL (H)).  Medications:  -Eliquis  5mg  PO BID (Last dose 8/19 AM)  Assessment: 50 yoM presented to ED with emesis, hypotension, and SOB. Pharmacy consulted to dose heparin  for afib/hx of PE. PMH includes: CAD status post CABG, CHF, sick sinus syndrome, pulmonary fibrosis on home oxygen  (2L), TAVR.   -5/25: NM perfusion indicated high probability of PE, CTa this admission showing no acute PE -CT abdomen: ileus vs partial SBO + resolving hematoma in R. Retroperitoneum   8/21 AM update:  aPTT supra-therapeutic  Goal of Therapy:  Heparin  level 0.3-0.7 units/ml aPTT 66-102 seconds Monitor platelets by anticoagulation protocol: Yes   Plan:  Dec heparin  to 1000 units/hr Check aPTT/anti-Xa level in 8 hours and daily while on heparin  Monitor with aPTT until correlates with heparin  level Continue  to monitor H&H and platelets Follow up transition back to Eliquis    Lynwood Mckusick, PharmD, BCPS Clinical Pharmacist Phone: [phone]

## 2024-07-25 NOTE — Evaluation (Signed)
 Occupational Therapy Evaluation Patient Details Name: Shane [name] MRN: [id number] DOB: 24-Nov-1941 Today's Date: 07/25/2024   History of Present Illness   Patient is a 83 y.o. male who presented to the ED with vomiting and shortness of breath-he was found to have hypotension-skin rash consistent with hives-he was thought to have anaphylaxis-and given epinephrine  with significant improvement.  However he was also found to have bowel obstruction. Past medical history of HFrEF, CAD s/p CABG 2021, aortic stenosis-s/p TAVR 2024, PAF, sick sinus syndrome-s/p leadless pacemaker, IPF on home O2, PE (May 2025) on anticoagulation.     Clinical Impressions Pt can typically transfer and walk short distances with his prosthesis and RW. He is mod I in self care and works [employer]. Pt with ill fitting prosthesis and only able to stand momentarily for pericare and linen change with +2 assist. He reports this is typical when not wearing his prosthesis for a few days. Pt requires set up to total assist for ADLs. He demonstrates poor activity tolerance with HR to 139 after standing and RR in the mid 40s. Pt reported shortness of breath, SpO2 of 94-100%. RN made aware and in room at end of session. Recommending HHOT upon discharge.      If plan is discharge home, recommend the following:   Two people to help with walking and/or transfers;A lot of help with bathing/dressing/bathroom;Assistance with cooking/housework;Assist for transportation;Help with stairs or ramp for entrance     Functional Status Assessment   Patient has had a recent decline in their functional status and demonstrates the ability to make significant improvements in function in a reasonable and predictable amount of time.     Equipment Recommendations   None recommended by OT     Recommendations for Other Services         Precautions/Restrictions   Precautions Precautions: Fall Precaution/Restrictions  Comments: watch HR Restrictions Weight Bearing Restrictions Per Provider Order: No     Mobility Bed Mobility Overal bed mobility: Modified Independent                  Transfers Overall transfer level: Needs assistance Equipment used: Rolling walker (2 wheels) Transfers: Sit to/from Stand Sit to Stand: +2 physical assistance, Min assist           General transfer comment: assist to rise and steady during pericare      Balance Overall balance assessment: Needs assistance   Sitting balance-Leahy Scale: Good     Standing balance support: Bilateral upper extremity supported Standing balance-Leahy Scale: Poor                             ADL either performed or assessed with clinical judgement   ADL Overall ADL's : Needs assistance/impaired Eating/Feeding: Independent   Grooming: Set up;Sitting   Upper Body Bathing: Set up;Sitting   Lower Body Bathing: +2 for physical assistance;Total assistance;Sit to/from stand   Upper Body Dressing : Minimal assistance;Sitting   Lower Body Dressing: +2 for physical assistance;Total assistance;Sit to/from stand   Toilet Transfer: Minimal assistance   Toileting- Clothing Manipulation and Hygiene: +2 for physical assistance;Total assistance;Sit to/from stand         General ADL Comments: unable to don R prosthesis adequately for transfers or ambulation, ill fitting     Vision Ability to See in Adequate Light: 0 Adequate Patient Visual Report: No change from baseline       Perception  Praxis         Pertinent Vitals/Pain Pain Assessment Pain Assessment: No/denies pain     Extremity/Trunk Assessment Upper Extremity Assessment Upper Extremity Assessment: Overall WFL for tasks assessed   Lower Extremity Assessment Lower Extremity Assessment: Defer to PT evaluation RLE Deficits / Details: R BKA LLE Deficits / Details: L TMA with wounds   Cervical / Trunk Assessment Cervical / Trunk  Assessment: Normal   Communication Communication Communication: No apparent difficulties   Cognition Arousal: Alert Behavior During Therapy: WFL for tasks assessed/performed Cognition: No apparent impairments                               Following commands: Intact       Cueing  General Comments   Cueing Techniques: Verbal cues     Exercises     Shoulder Instructions      Home Living Family/patient expects to be discharged to:: Private residence Living Arrangements: Spouse/significant other Available Help at Discharge: Family;Available PRN/intermittently Type of Home: House Home Access: Ramped entrance     Home Layout: Two level;Able to live on main level with bedroom/bathroom     Bathroom Shower/Tub: Walk-in shower   Bathroom Toilet: Handicapped height     Home Equipment: Pharmacist, hospital (2 wheels);Electric scooter;Wheelchair - manual;Cane - single point;Other (comment);Grab bars - tub/shower;Grab bars - toilet;Adaptive equipment Adaptive Equipment: Reacher;Sock aid;Long-handled shoe horn;Long-handled sponge        Prior Functioning/Environment Prior Level of Function : Independent/Modified Independent;Working/employed             Mobility Comments: Pt reports he doesn't walk much, but can complete short distances using RW and R BKA prosthesis. Typically will transfer to w/c or scooter. 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 States he can navigate stairs with a rail. ADLs Comments: ModI with ADLs. Wife does IADLs. He can drive, but doesn't    OT Problem List: Decreased strength;Decreased activity tolerance;Impaired balance (sitting and/or standing);Cardiopulmonary status limiting activity   OT Treatment/Interventions: Self-care/ADL training;DME and/or AE instruction;Therapeutic activities;Patient/family education;Balance training      OT Goals(Current goals can be found in the care plan section)    Acute Rehab OT Goals OT Goal Formulation: With patient Time For Goal Achievement: 08/08/24 Potential to Achieve Goals: Good ADL Goals Pt Will Perform Grooming: sitting;with modified independence Pt Will Perform Lower Body Bathing: with modified independence;sitting/lateral leans;sit to/from stand Pt Will Perform Lower Body Dressing: with modified independence;sit to/from stand Pt Will Transfer to Toilet: with modified independence;stand pivot transfer;bedside commode Pt Will Perform Toileting - Clothing Manipulation and hygiene: with modified independence;sit to/from stand   OT Frequency:  Min 2X/week    Co-evaluation PT/OT/SLP Co-Evaluation/Treatment: Yes Reason for Co-Treatment: For patient/therapist safety   OT goals addressed during session: ADL's and self-care      AM-PAC OT 6 Clicks Daily Activity     Outcome Measure Help from another person eating meals?: None Help from another person taking care of personal grooming?: A Little Help from another person toileting, which includes using toliet, bedpan, or urinal?: Total Help from another person bathing (including washing, rinsing, drying)?: A Lot Help from another person to put on and taking off regular upper body clothing?: A Little Help from another person to put on and taking off regular lower body clothing?: Total 6 Click Score: 14   End of Session Equipment Utilized During Treatment: Gait belt;Rolling walker (2 wheels);Oxygen Nurse Communication: Other (  comment) (aware of HR and chest pain)  Activity Tolerance: Patient limited by fatigue;Treatment limited secondary to medical complications (Comment) (elevated HR) Patient left: in bed;with call bell/phone within reach;with bed alarm set  OT Visit Diagnosis: Unsteadiness on feet (R26.81);Muscle weakness (generalized) (M62.81);Other (comment) (decreased activity tolerance)                Time: 1420-1430 OT Time Calculation (min): 27 min Charges:  OT General  Charges $OT Visit: 1 Visit OT Evaluation $OT Eval Moderate Complexity: 1 Mod  Mliss HERO, OTR/L Acute Rehabilitation Services Office: [phone]   Kennth Mliss Helling 07/25/2024, 4:46 PM

## 2024-07-25 NOTE — Progress Notes (Signed)
 Patient and wife upset and having several concerns/complaints about previous care, wanting to transfer, get NG tube out, and c/o pain from NG tube.  RN listened to concerns and addressed all requests.  Pt NG tube irrigated, pt nauseated and NG tube placement confirmed at 70 cm to right nare, secured with device and tape.  Pt given Benadryl  25 mg IVP, Zofran  4 mg IVP and Trazadone for sleep.  Pt concerned that glucose level would drop too quickly.  On heparin  gtt at 1200 u/hr, will monitor. Pt having flatus and Bms x3 loose stools, pt upset and still wanting a second opinion and not understanding why NG tube needs to stay in place, explained that he still has a partial SBO.   0300 Pt pulled his NG tube out most of the way and RN discontinued tube and notified the MD.

## 2024-07-25 NOTE — Progress Notes (Signed)
 Rounding Note   Patient Name: Shane [name] Date of Encounter: 07/25/2024  [facility] Cardiologist: Redell Shallow, MD Patient follows with [facility] for EP services  Subjective Clinically improved this morning.  Denies chest pain shortness of breath or abdominal pain.  Taking p.o. fluids.  Scheduled Meds:  aspirin  EC  81 mg Oral Daily   atorvastatin   80 mg Oral QHS   escitalopram   10 mg Oral Daily   furosemide   40 mg Oral Daily   gabapentin   100 mg Oral TID   insulin  aspart  0-9 Units Subcutaneous Q4H   insulin  glargine  15 Units Subcutaneous Daily   pantoprazole  (PROTONIX ) IV  40 mg Intravenous QHS   Continuous Infusions:  sodium chloride  125 mL/hr at 07/24/24 2304   famotidine  (PEPCID ) IV 20 mg (07/25/24 0910)   heparin  1,000 Units/hr (07/25/24 0212)   PRN Meds: acetaminophen  **OR** acetaminophen , albuterol , diphenhydrAMINE , metoCLOPramide  (REGLAN ) injection, ondansetron  **OR** ondansetron  (ZOFRAN ) IV, traZODone    Vital Signs  Vitals:   07/25/24 0009 07/25/24 0412 07/25/24 0753 07/25/24 0858  BP:  106/69 134/64   Pulse: 67  75   Resp: (!) 24  17   Temp: 97.6 F (36.4 C) 98.3 F (36.8 C) 98 F (36.7 C) 98.6 F (37 C)  TempSrc:  Oral Oral Oral  SpO2: 97%  99%   Weight:      Height:        Intake/Output Summary (Last 24 hours) at 07/25/2024 1040 Last data filed at 07/25/2024 0600 Gross per 24 hour  Intake --  Output 1185 ml  Net -1185 ml      07/24/2024   12:00 AM 05/09/2024    2:31 PM 04/14/2024    5:00 AM  Last 3 Weights  Weight (lbs) 229 lb 4.5 oz 232 lb 12.8 oz 227 lb 4.7 oz  Weight (kg) 104 kg 105.597 kg 103.1 kg      Telemetry Intermittently paced rhythm personally Reviewed  ECG  Not performed today- Personally Reviewed  Physical Exam GEN: No acute distress on [place] and with NG in place.   Cardiac: RRR, no murmurs, rubs, or gallops.  Respiratory: Fine crackles bilaterally GI: Active bowel sounds.  Nontender MS: No  edema; right BKA, left metatarsal amputation.  Compression stockings in place. Neuro:  Nonfocal  Psych: Normal affect   Labs High Sensitivity Troponin:   Recent Labs  Lab 07/24/24 0158 07/24/24 0407 07/24/24 1036 07/24/24 1412 07/24/24 1651  TROPONINIHS 4,313* 4,475* 5,053* 5,648* 4,867*     Chemistry Recent Labs  Lab 07/23/24 1731 07/23/24 1737 07/23/24 1831 07/23/24 2353 07/24/24 0019 07/24/24 1036 07/25/24 0442  NA 134*   < > 136  --  133*  --  137  K 4.9   < > 4.7  --  5.3*  --  4.1  CL 95*  --  101  --   --   --  104  CO2 23  --   --   --   --   --  23  GLUCOSE 158*  --  153*  --   --   --  150*  BUN 53*  --  76*  --   --   --  44*  CREATININE 1.67*  --  1.70*  --   --   --  1.61*  CALCIUM  8.1*  --   --   --   --   --  8.2*  MG  --   --   --  1.5*  --  2.0  --   PROT 6.9  --   --   --   --   --  6.0*  ALBUMIN  3.1*  --   --   --   --   --  2.6*  AST 39  --   --   --   --   --  48*  ALT 11  --   --   --   --   --  21  ALKPHOS 98  --   --   --   --   --  62  BILITOT 1.7*  --   --   --   --   --  0.7  GFRNONAA 40*  --   --   --   --   --  42*  ANIONGAP 16*  --   --   --   --   --  10   < > = values in this interval not displayed.    Lipids No results for input(s): CHOL, TRIG, HDL, LABVLDL, LDLCALC, CHOLHDL in the last 168 hours.  Hematology Recent Labs  Lab 07/23/24 1731 07/23/24 1737 07/23/24 1831 07/24/24 0019 07/24/24 1036  WBC 15.1*  --   --   --  12.0*  RBC 4.80  --   --   --  3.90*  HGB 14.7   < > 15.6 11.9* 11.9*  HCT 44.8   < > 46.0 35.0* 36.8*  MCV 93.3  --   --   --  94.4  MCH 30.6  --   --   --  30.5  MCHC 32.8  --   --   --  32.3  RDW 13.1  --   --   --  13.2  PLT 245  --   --   --  170   < > = values in this interval not displayed.   Thyroid  Recent Labs  Lab 07/23/24 1906 07/23/24 2353  TSH 6.678*  --   FREET4  --  1.00    BNP Recent Labs  Lab 07/24/24 0407  BNP 354.2*    DDimer No results for input(s): DDIMER  in the last 168 hours.   Radiology  DG Abd Portable 1V-Small Bowel Obstruction Protocol-initial, 8 hr delay Result Date: 07/24/2024 CLINICAL DATA:  Small-bowel obstruction EXAM: PORTABLE ABDOMEN - 1 VIEW COMPARISON:  Abdominal x-ray 07/24/2024. CT abdomen and pelvis 07/23/2024. FINDINGS: Dilated central small bowel loops measuring up to 4.3 cm are unchanged. Air and contrast are seen throughout nondilated colon to the level the rectum. Enteric tube tip is in the mid body of the stomach. Phleboliths are present. Vascular stent is seen in the right lower extremity. No suspicious calcifications are identified. Severe degenerative changes affect the spine. IMPRESSION: Unchanged dilated central small bowel loops concerning for small bowel obstruction. Electronically Signed   By: Greig Pique M.D.   On: 07/24/2024 17:16   ECHOCARDIOGRAM COMPLETE Result Date: 07/24/2024    ECHOCARDIOGRAM REPORT   Patient Name:   Shane [name] Date of Exam: 07/24/2024 Medical Rec #:  [id number]        Height:       72.0 in Accession #:    [id number]       Weight:       229.3 lb Date of Birth:  08/22/1941        BSA:          2.258 m Patient Age:  83 years         BP:           109/52 mmHg Patient Gender: M                HR:           60 bpm. Exam Location:  Inpatient Procedure: 2D Echo, Cardiac Doppler, Color Doppler and Intracardiac            Opacification Agent (Both Spectral and Color Flow Doppler were            utilized during procedure). Indications:    CHF-Acute Systolic I50.21  History:        Patient has prior history of Echocardiogram examinations, most                 recent 04/11/2024. CHF, CAD and Previous Myocardial Infarction,                 Prior CABG, PAD and CKD, Stage 3, Arrythmias:Atrial Fibrillation                 and RBBB, Signs/Symptoms:Hypotension, Shortness of Breath and                 Syncope; Risk Factors:Hypertension, Sleep Apnea and Diabetes.                 Aortic Valve: 29 mm a native pulmonic  valve is present in the                 aortic position. Procedure Date: 12/09/23.  Sonographer:    Thea Norlander RCS Referring Phys: [id number] ANASTASSIA DOUTOVA IMPRESSIONS  1. Left ventricular ejection fraction, by estimation, is 30 to 35%. The left ventricle has moderately decreased function. Left ventricular endocardial border not optimally defined to evaluate regional wall motion. The left ventricular internal cavity size was mildly dilated. There is mild concentric left ventricular hypertrophy. Left ventricular diastolic parameters are consistent with Grade I diastolic dysfunction (impaired relaxation).  2. Right ventricular systolic function is moderately reduced. The right ventricular size is mildly enlarged.  3. Left atrial size was moderately dilated.  4. Right atrial size was mildly dilated.  5. The mitral valve is normal in structure. Mild to moderate mitral valve regurgitation. No evidence of mitral stenosis.  6. The aortic valve is normal in structure. Aortic valve regurgitation is not visualized. No aortic stenosis is present. There is a 29 mm a native pulmonic valve present in the aortic position. Procedure Date: 12/09/23.  7. Aortic dilatation noted. There is borderline dilatation of the ascending aorta, measuring 38 mm.  8. The inferior vena cava is dilated in size with <50% respiratory variability, suggesting right atrial pressure of 15 mmHg. FINDINGS  Left Ventricle: Left ventricular ejection fraction, by estimation, is 30 to 35%. The left ventricle has moderately decreased function. Left ventricular endocardial border not optimally defined to evaluate regional wall motion. Definity  contrast agent was given IV to delineate the left ventricular endocardial borders. The left ventricular internal cavity size was mildly dilated. There is mild concentric left ventricular hypertrophy. Left ventricular diastolic parameters are consistent with Grade I diastolic dysfunction (impaired relaxation). Right  Ventricle: The right ventricular size is mildly enlarged. No increase in right ventricular wall thickness. Right ventricular systolic function is moderately reduced. Left [facility]: Left atrial size was moderately dilated. Right [facility]: Right atrial size was mildly dilated. Pericardium: There is no evidence of pericardial effusion. Mitral Valve: The mitral valve is normal  in structure. Mild mitral annular calcification. Mild to moderate mitral valve regurgitation. No evidence of mitral valve stenosis. Tricuspid Valve: The tricuspid valve is normal in structure. Tricuspid valve regurgitation is trivial. No evidence of tricuspid stenosis. Aortic Valve: The aortic valve is normal in structure. Aortic valve regurgitation is not visualized. No aortic stenosis is present. Aortic valve peak gradient measures 10.0 mmHg. There is a 29 mm a native pulmonic valve present in the aortic position. Procedure Date: 12/09/23. Pulmonic Valve: The pulmonic valve was normal in structure. Pulmonic valve regurgitation is not visualized. No evidence of pulmonic stenosis. Aorta: Aortic dilatation noted. There is borderline dilatation of the ascending aorta, measuring 38 mm. Venous: The inferior vena cava is dilated in size with less than 50% respiratory variability, suggesting right atrial pressure of 15 mmHg. IAS/Shunts: No atrial level shunt detected by color flow Doppler.  LEFT VENTRICLE PLAX 2D LVIDd:         5.60 cm   Diastology LVIDs:         4.50 cm   LV e' medial:    5.33 cm/s LV PW:         1.40 cm   LV E/e' medial:  16.2 LV IVS:        1.10 cm   LV e' lateral:   5.68 cm/s LVOT diam:     2.30 cm   LV E/e' lateral: 15.2 LV SV:         86 LV SV Index:   38 LVOT Area:     4.15 cm  RIGHT VENTRICLE            IVC RV S prime:     6.16 cm/s  IVC diam: 2.30 cm TAPSE (M-mode): 1.2 cm LEFT ATRIUM           Index        RIGHT ATRIUM           Index LA diam:      4.70 cm 2.08 cm/m   RA Area:     23.40 cm LA Vol (A2C): 62.5 ml 27.68 ml/m  RA  Volume:   69.60 ml  30.82 ml/m LA Vol (A4C): 62.7 ml 27.77 ml/m  AORTIC VALVE AV Area (Vmax): 2.89 cm AV Vmax:        158.00 cm/s AV Peak Grad:   10.0 mmHg LVOT Vmax:      110.00 cm/s LVOT Vmean:     74.400 cm/s LVOT VTI:       0.208 m  AORTA Ao Root diam: 3.10 cm Ao Asc diam:  3.80 cm MITRAL VALVE MV Area (PHT): 3.08 cm    SHUNTS MV Decel Time: 246 msec    Systemic VTI:  0.21 m MV E velocity: 86.20 cm/s  Systemic Diam: 2.30 cm MV A velocity: 97.90 cm/s MV E/A ratio:  0.88 Toribio Fuel MD Electronically signed by Toribio Fuel MD Signature Date/Time: 07/24/2024/12:21:20 PM    Final    DG Abdomen 1 View Result Date: 07/24/2024 CLINICAL DATA:  NG tube placement. EXAM: ABDOMEN - 1 VIEW COMPARISON:  01/06/2018 FINDINGS: NG tube tip is in the stomach with proximal side port below the GE junction. Visualized upper abdomen demonstrates nonspecific bowel gas pattern. IMPRESSION: NG tube tip is in the stomach. Electronically Signed   By: Camellia Candle M.D.   On: 07/24/2024 05:23   CT Angio Chest PE W and/or Wo Contrast Result Date: 07/23/2024 CLINICAL DATA:  Chest pain and shortness of breath. Concern for pulmonary  disease. EXAM: CT ANGIOGRAPHY CHEST WITH CONTRAST TECHNIQUE: Multidetector CT imaging of the chest was performed using the standard protocol during bolus administration of intravenous contrast. Multiplanar CT image reconstructions and MIPs were obtained to evaluate the vascular anatomy. RADIATION DOSE REDUCTION: This exam was performed according to the departmental dose-optimization program which includes automated exposure control, adjustment of the mA and/or kV according to patient size and/or use of iterative reconstruction technique. CONTRAST:  75mL OMNIPAQUE  IOHEXOL  350 MG/ML SOLN COMPARISON:  Chest CT dated 05/31/2024. FINDINGS: Evaluation of this exam is limited due to respiratory motion. Cardiovascular: There is mild cardiomegaly. No pericardial effusion. There is coronary vascular  calcification. Aortic valve repair. Mild atherosclerotic calcification of the thoracic aorta. No aneurysmal dilatation. Evaluation of the pulmonary arteries is limited due to respiratory motion as well as streak artifact caused by patient's arms. No porta artery embolus identified. Mediastinum/Nodes: Mildly enlarged right hilar lymph nodes measure 11 mm. The esophagus is grossly unremarkable no mediastinal fluid collection. Lungs/Pleura: Diffuse interstitial coarsening and fibrosis in keeping with interstitial lung disease. Small left pleural effusion. No focal consolidation or pneumothorax. The central airways are patent. Upper Abdomen: No acute abnormality. Musculoskeletal: Osteopenia with degenerative changes. Median sternotomy wires. No acute osseous pathology. Review of the MIP images confirms the above findings. IMPRESSION: 1. No CT evidence of pulmonary artery embolus. 2. Interstitial lung disease. 3. Small left pleural effusion. 4.  Aortic Atherosclerosis (ICD10-I70.0). Electronically Signed   By: Vanetta Chou M.D.   On: 07/23/2024 19:31   CT ABDOMEN PELVIS W CONTRAST Result Date: 07/23/2024 CLINICAL DATA:  Nausea and vomiting EXAM: CT ABDOMEN AND PELVIS WITH CONTRAST TECHNIQUE: Multidetector CT imaging of the abdomen and pelvis was performed using the standard protocol following bolus administration of intravenous contrast. RADIATION DOSE REDUCTION: This exam was performed according to the departmental dose-optimization program which includes automated exposure control, adjustment of the mA and/or kV according to patient size and/or use of iterative reconstruction technique. CONTRAST:  75mL OMNIPAQUE  IOHEXOL  350 MG/ML SOLN COMPARISON:  CT abdomen and pelvis 12/30/2023. FINDINGS: Lower chest: Chronic interstitial changes are again noted in the lung bases. The heart is enlarged. Hepatobiliary: No focal liver abnormality is seen. No gallstones, gallbladder wall thickening, or biliary dilatation.  Pancreas: Unremarkable. No pancreatic ductal dilatation or surrounding inflammatory changes. Spleen: Normal in size without focal abnormality. Adrenals/Urinary Tract: There is no hydronephrosis or perinephric stranding. No perinephric fluid collection identified. Knee adrenal glands and bladder are within normal limits. Stomach/Bowel: There are mildly dilated central small bowel loops. No wall thickening or inflammation. No transition point visualized. The colon is nondilated. There is diffuse colonic diverticulosis. The appendix is normal. Stomach is within normal limits. Vascular/Lymphatic: Aortic atherosclerosis. No enlarged abdominal or pelvic lymph nodes. Reproductive: Prostate is unremarkable. Other: There is no free air. There is trace free fluid in the left upper quadrant previously identified right retroperitoneal hematoma has significantly decreased in size. Enhancing low-attenuation areas seen in the right retroperitoneum measuring 9.1 x 2.7 x 5.3 cm. There is no focal abdominal wall hernia. Musculoskeletal: Degenerative changes affect the spine the hips. There are laminectomy defects at L3-L4. IMPRESSION: 1. Mildly dilated central small bowel loops without transition point. Findings may represent ileus or partial small bowel obstruction. 2. Trace free fluid in the left upper quadrant. 3. Enhancing low-attenuation collection in the right retroperitoneum is most likely related to resolving hematoma. Infection not excluded. 4. Colonic diverticulosis. Aortic Atherosclerosis (ICD10-I70.0). Electronically Signed   By: Greig Maple HERO.D.  On: 07/23/2024 19:30   DG Chest Portable 1 View Result Date: 07/23/2024 CLINICAL DATA:  Short of breath, vomiting EXAM: PORTABLE CHEST 1 VIEW COMPARISON:  04/11/2024 FINDINGS: Two frontal views of the chest demonstrate stable postsurgical changes from CABG and aortic valve replacement. Cardiac silhouette remains enlarged. Chronic background pulmonary scarring and fibrosis,  with increased pulmonary vascular congestion and patchy bibasilar consolidation consistent with superimposed edema. Small bilateral effusions. No pneumothorax. IMPRESSION: 1. Congestive heart failure superimposed upon chronic pulmonary fibrosis and scarring. Electronically Signed   By: Ozell Daring M.D.   On: 07/23/2024 18:24    Cardiac Studies  2D echocardiogram (07/24/2024)  IMPRESSIONS     1. Left ventricular ejection fraction, by estimation, is 30 to 35%. The  left ventricle has moderately decreased function. Left ventricular  endocardial border not optimally defined to evaluate regional wall motion.  The left ventricular internal cavity  size was mildly dilated. There is mild concentric left ventricular  hypertrophy. Left ventricular diastolic parameters are consistent with  Grade I diastolic dysfunction (impaired relaxation).   2. Right ventricular systolic function is moderately reduced. The right  ventricular size is mildly enlarged.   3. Left atrial size was moderately dilated.   4. Right atrial size was mildly dilated.   5. The mitral valve is normal in structure. Mild to moderate mitral valve  regurgitation. No evidence of mitral stenosis.   6. The aortic valve is normal in structure. Aortic valve regurgitation is  not visualized. No aortic stenosis is present. There is a 29 mm a native  pulmonic valve present in the aortic position. Procedure Date: 12/09/23.   7. Aortic dilatation noted. There is borderline dilatation of the  ascending aorta, measuring 38 mm.   8. The inferior vena cava is dilated in size with <50% respiratory  variability, suggesting right atrial pressure of 15 mmHg.    Patient Profile   83 y.o. 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 He was hypotensive on arrival 88/44. Given concern for anaphylaxis patient was given steroids and epi. Lactic acid [4.1 -> 7.4] with leukocytosis with elevated neutrophil count, now on IV antibiotics  SBO seen on CT A/P, surgery consulted. Elevated troponins, cardiology consulted.   Assessment & Plan  NSTEMI -Demand Ischemia Type II Patient was hypotensive and hypoxic on arrival, given concern for anaphylaxis he was given steroids and epinephrine .  He was noted to have an elevated lactic acid up to 7.4 with leukocytosis, he was given fluids and IV antibiotics There is currently concern for an SBO. Troponin trend 18 -> 93 -> 3,304 -> 4313 -> 4,475 Lactic acidosis improving, now 2.0 ECGs are similar to previous As there was initially concern for ACS he was placed on heparin  gtt and loaded with ASA Echocardiogram pending  Suspect that his troponin elevation is due to demand ischemia in a patient with underlying coronary artery disease.  This is a type II non-STEMI..   Continue heparin  GTT for 48 hours then can put back on oral anticoagulant Continue ASA 81 mg  CAD s/p CABG [LIMA-LAD,  SVG-OM1, SVG-PDA] Hyperlipidemia Discontinued plavix  after patient developed retroperitoneal hematoma Denied any anginal chest pain since his CABG, only notices an occasional chest pain at rest in the evenings. He did note chest discomfort at onset of symptoms, described worse with inspiration.  Reported this chest pain felt differently than his prior ischemic chest pain.   Continue ASA 81 mg Continue Lipitor  80 mg  Hx of hypertension Hypotension on arrival BP:109/52 Continue to hold antihypertensives  HFrEF Repeat echo yesterday showed unchanged EF in the 35 to  40% range.  Atrial Fibrillation Patient was referred for watchman given risk of systemic anticoagulation 2/2 to recent retroperitoneal hematoma Patient is on PTA Eliquis  5 mg twice daily, though he may need to be on a reduced dose given his age and renal function. Will reach out to pharmacy. Continue to hold Eliquis  Continue heparin  gtt for 48 hours then restart Eliquis   SSS s/p leadless pacemaker Syncope Patient told wife he had passed out in the bathroom, most likely due to his hypotension however, he does have a recent admission for syncope thought to be related to hypotension, dehydration, and acute PE at that time 04/2024.  CTA negative for PE.  Also noted during that admission his pacemaker was reprogrammed due to dyssynchrony noted on telemetry. Would continue to monitor cardiac telemetry while admitted.   Hypomagnesium  Repleted  Per primary SBO T2DM CKD (baseline ~ 1.6) IPF on home 02 Hx of recent PE Non-healing ulcer Chronic anemia Retroperitoneal hematoma   Patient looks clinically improved this morning.  His lungs are clear.  His heart reveals regular rhythm.  He is intermittently pacing.  He is taking oral liquids.  He denies chest pain.  Suspect he had a type II non-STEMI secondary to demand ischemia.  The etiology of his initial event is still unclear.  From our point of view, probably okay for discharge.  We will arrange outpatient follow-up with Dr. Pietro.  For questions or updates, please contact [facility] Please consult [email or web address] for contact info under     Dorn DOROTHA Lesches, M.D., [facility], [place], [facility]  [address] [place], [place]  [address]  [phone] 07/25/2024 10:40 AM

## 2024-07-25 NOTE — Progress Notes (Addendum)
 PROGRESS NOTE        PATIENT DETAILS Name: Shane [name] Age: 83 y.o. Sex: male Date of Birth: 04-29-1941 Admit Date: 07/23/2024 Admitting Physician Blease Quiver, MD [name], Charlie, MD  Brief Summary: Patient is a 83 y.o.  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  However he was also found to have bowel obstruction and admitted to the hospitalist service.    Significant events: 8/19>> admit to [facility].  NG tube placed for SBO.  Given epinephrine  for presumed anaphylactic shock. 8/21>> patient removed NG tube.  Significant studies: 8/19>> CT abdomen/pelvis: Dilated central small bowel loops without transition point-possible partial SBO. 8/19>> CTA chest: No PE.  Interstitial lung disease. 8/20>> echo: EF 30-35%  Significant microbiology data: 8/19>> COVID/influenza/RSV PCR: Negative 8/19>> blood culture: No growth 8/20>> respiratory virus panel: Negative  Procedures: None  Consults: Cardiology General Surgery  Subjective: Lying comfortably in bed-denies any chest pain or shortness of breath.  No further nausea or vomiting overnight.  He has had multiple BMs.  Denies any abdominal pain or distention.  Objective: Vitals: Blood pressure 134/64, pulse 75, temperature 98.6 F (37 C), temperature source Oral, resp. rate 17, height 6' (1.829 m), weight 104 kg, SpO2 99%.   Exam: Gen Exam:Alert awake-not in any distress HEENT:atraumatic, normocephalic Chest: B/L clear to auscultation anteriorly CVS:S1S2 regular Abdomen:soft non tender, non distended Extremities:no edema Neurology: Non focal Skin:  no rash  Pertinent Labs/Radiology:    Latest Ref Rng & Units 07/24/2024   10:36 AM 07/24/2024   12:19 AM 07/23/2024    6:31 PM  CBC  WBC 4.0 - 10.5 K/uL 12.0     Hemoglobin 13.0 - 17.0 g/dL 88.0  88.0  84.3   Hematocrit 39.0 - 52.0 % 36.8  35.0  46.0   Platelets 150 - 400 K/uL 170       Lab Results  Component Value Date   NA 137 07/25/2024   K 4.1 07/25/2024   CL 104 07/25/2024   CO2 23 07/25/2024      Assessment/Plan: Possible anaphylactic shock Hypotensive-hive-like rash (spouse has pictures on her phone)-unclear what the provoking factor was.  Patient denies any new foods/meds-no clear-cut environmental trigger. Given epinephrine /antihistamine/steroids and significantly better. Given unexplained hives-outpatient dermatology evaluation recommended for allergy testing.  PSBO Did have vomiting and abdominal pain on initial presentation Imaging studies consistent with partial SBO He pulled out his NG tube last night Clinically improved-no further vomiting-multiple BMs overnight Since clinically improved-clear liquids started this morning Will see how he does General Surgery following.   Type II demand ischemia Secondary to hypotension Echo essentially unchanged from prior Cardiology following Heparin  infusion x 48 hours.  Chronic HFrEF Volume status stable Resume Lasix  at half of home dose Resume lisinopril  in the next several days once blood pressure permits.  History of sick sinus syndrome-s/p leadless pacemaker Telemetry monitoring  HTN Starting diuretics today Will resume lisinopril  in next several days.  History of CAD s/p CABG 2021 History of aortic stenosis-s/p TAVR 2024 Stable-no anginal symptoms-elevated troponin secondary to demand ischemia. Resume low-dose aspirin chrystie  today.  History of PE On IV heparin  with plans to transition to Eliquis  once completed IV heparin  x 48 hours  History of recent retroperitoneal hematoma Per CT report-resolving.   Hb stable.  DM-2 (A1c 8.7 on 04/09/2024) CBG stable Lantus  15 units daily+ SSI  Recent Labs    07/25/24 0007 07/25/24 0409 07/25/24 0855  GLUCAP 155* 142* 137*     OSA CPAP nightly  PAD-s/p right right BKA, left TMA Stump appears benign Aspirin /statin  Mood disorder Resume Lexapro   Class 1 Obesity: Estimated body mass index is 31.1 kg/m as calculated from the following:   Height as of this encounter: 6' (1.829 m).   Weight as of this encounter: 104 kg.   Code status:   Code Status: Full Code   DVT Prophylaxis: SCDs Start: 07/24/24 9385 IV heparin   Family Communication: Spouse at bedside   Disposition Plan: Status is: Inpatient Remains inpatient appropriate because: Severity of illness   Planned Discharge Destination:Home health   Diet: Diet Order             Diet clear liquid Fluid consistency: Thin  Diet effective now                     Antimicrobial agents: Anti-infectives (From admission, onward)    Start     Dose/Rate Route Frequency Ordered Stop   07/24/24 1700  vancomycin  (VANCOREADY) IVPB 750 mg/150 mL  Status:  Discontinued        750 mg 150 mL/hr over 60 Minutes Intravenous Every 24 hours 07/24/24 0117 07/24/24 1704   07/24/24 0500  metroNIDAZOLE  (FLAGYL ) IVPB 500 mg  Status:  Discontinued        500 mg 100 mL/hr over 60 Minutes Intravenous Every 12 hours 07/24/24 0013 07/24/24 1704   07/24/24 0500  ceFEPIme  (MAXIPIME ) 2 g in sodium chloride  0.9 % 100 mL IVPB  Status:  Discontinued        2 g 200 mL/hr over 30 Minutes Intravenous Every 12 hours 07/24/24 0116 07/24/24 1704   07/23/24 1715  ceFEPIme  (MAXIPIME ) 2 g in sodium chloride  0.9 % 100 mL IVPB        2 g 200 mL/hr over 30 Minutes Intravenous  Once 07/23/24 1707 07/23/24 1921   07/23/24 1715  metroNIDAZOLE  (FLAGYL ) IVPB 500 mg        500 mg 100 mL/hr over 60 Minutes Intravenous  Once 07/23/24 1707 07/23/24 2046   07/23/24 1715  vancomycin  (VANCOCIN ) IVPB 1000 mg/200 mL  premix  Status:  Discontinued        1,000 mg 200 mL/hr over 60 Minutes Intravenous  Once 07/23/24 1707 07/23/24 1709   07/23/24 1715  vancomycin  (VANCOREADY) IVPB 2000 mg/400 mL        2,000 mg 200 mL/hr over 120 Minutes Intravenous  Once 07/23/24 1709 07/23/24 2152        MEDICATIONS: Scheduled Meds:  insulin  aspart  0-9 Units Subcutaneous Q4H   insulin  glargine  15 Units Subcutaneous Daily   pantoprazole  (PROTONIX ) IV  40 mg Intravenous QHS   Continuous Infusions:  sodium chloride  125 mL/hr at 07/24/24 2304   famotidine  (PEPCID ) IV 20 mg (07/25/24 0910)   heparin  1,000 Units/hr (07/25/24 0212)   PRN Meds:.acetaminophen  **OR** acetaminophen , albuterol , diphenhydrAMINE , metoCLOPramide  (REGLAN ) injection, ondansetron  **OR** ondansetron  (ZOFRAN ) IV, traZODone    I have personally reviewed following labs and imaging studies  LABORATORY DATA: CBC: Recent Labs  Lab 07/23/24 1731 07/23/24 1737 07/23/24 1831 07/24/24 0019 07/24/24 1036  WBC 15.1*  --   --   --  12.0*  NEUTROABS 12.4*  --   --   --   --   HGB 14.7 15.0 15.6 11.9* 11.9*  HCT 44.8 44.0 46.0 35.0* 36.8*  MCV 93.3  --   --   --  94.4  PLT 245  --   --   --  170    Basic Metabolic Panel: Recent Labs  Lab 07/23/24 1731 07/23/24 1737 07/23/24 1831 07/23/24 2353 07/24/24 0019 07/24/24 1036 07/25/24 0442  NA 134* 137 136  --  133*  --  137  K 4.9 4.8 4.7  --  5.3*  --  4.1  CL 95*  --  101  --   --   --  104  CO2 23  --   --   --   --   --  23  GLUCOSE 158*  --  153*  --   --   --  150*  BUN 53*  --  76*  --   --   --  44*  CREATININE 1.67*  --  1.70*  --   --   --  1.61*  CALCIUM  8.1*  --   --   --   --   --  8.2*  MG  --   --   --  1.5*  --  2.0  --   PHOS  --   --   --  3.8  --  3.9  --     GFR: Estimated Creatinine Clearance: 43.4 mL/min (A) (by C-G formula based on SCr of 1.61 mg/dL (H)).  Liver Function Tests: Recent Labs  Lab 07/23/24 1731 07/25/24 0442  AST 39 48*  ALT 11 21  ALKPHOS  98 62  BILITOT 1.7* 0.7  PROT 6.9 6.0*  ALBUMIN  3.1* 2.6*   Recent Labs  Lab 07/23/24 1731  LIPASE 33   No results for input(s): AMMONIA in the last 168 hours.  Coagulation Profile: No results for input(s): INR, PROTIME in the last 168 hours.  Cardiac Enzymes: Recent Labs  Lab 07/23/24 2353  CKTOTAL 307    BNP (last 3 results) No results for input(s): PROBNP in the last 8760 hours.  Lipid Profile: No results for input(s): CHOL, HDL, LDLCALC, TRIG, CHOLHDL, LDLDIRECT in the last 72 hours.  Thyroid Function Tests: Recent Labs    07/23/24 1906 07/23/24 2353  TSH 6.678*  --   FREET4  --  1.00    Anemia Panel: No results for input(s): VITAMINB12, FOLATE, FERRITIN, TIBC, IRON , RETICCTPCT in the last 72 hours.  Urine analysis:    Component Value Date/Time   COLORURINE YELLOW 07/24/2024 0732   APPEARANCEUR CLEAR 07/24/2024 0732   LABSPEC 1.023 07/24/2024 0732   PHURINE 5.0 07/24/2024 0732   GLUCOSEU NEGATIVE 07/24/2024 0732   HGBUR NEGATIVE 07/24/2024 0732   BILIRUBINUR NEGATIVE 07/24/2024 0732   KETONESUR NEGATIVE 07/24/2024 0732   PROTEINUR NEGATIVE 07/24/2024 0732   UROBILINOGEN 1.0 09/27/2011 1112   NITRITE NEGATIVE 07/24/2024 0732   LEUKOCYTESUR NEGATIVE 07/24/2024 0732    Sepsis Labs: Lactic Acid, Venous    Component Value Date/Time   LATICACIDVEN 2.0 (HH) 07/24/2024 1056    MICROBIOLOGY: Recent Results (from the past 240 hours)  Blood culture (routine x 2)     Status: None (Preliminary result)   Collection Time: 07/23/24  5:06 PM   Specimen: BLOOD  Result Value Ref Range Status   Specimen Description BLOOD SITE NOT SPECIFIED  Final   Special Requests   Final    BOTTLES DRAWN AEROBIC AND ANAEROBIC Blood Culture results may not be optimal due to an inadequate volume of blood received in culture bottles   Culture   Final    NO GROWTH 2 DAYS Performed at [facility], [address] [place], [place]  [address]    Report Status PENDING  Incomplete  Resp panel by RT-PCR (RSV, Flu A&B, Covid)     Status: None   Collection Time: 07/23/24  5:07 PM   Specimen: Nasal Swab  Result Value Ref Range Status   SARS Coronavirus 2 by RT PCR NEGATIVE NEGATIVE Final   Influenza A by PCR NEGATIVE NEGATIVE Final   Influenza B by PCR NEGATIVE NEGATIVE Final    Comment: (NOTE) The Xpert Xpress SARS-CoV-2/FLU/RSV plus assay is intended as an aid in the diagnosis of influenza from Nasopharyngeal swab specimens and should not be used as a sole basis for treatment. Nasal washings and aspirates are unacceptable for Xpert Xpress SARS-CoV-2/FLU/RSV testing.  Fact Sheet for Patients: BloggerCourse.com  Fact Sheet for Healthcare Providers: SeriousBroker.it  This test is not yet approved or cleared by the [place]  FDA and has been authorized for detection and/or diagnosis of SARS-CoV-2 by FDA under an Emergency Use Authorization (EUA). This EUA will remain in effect (meaning this test can be used) for the duration of the COVID-19 declaration under Section 564(b)(1) of the Act, 21 U.S.C. section 360bbb-3(b)(1), unless the authorization is terminated or revoked.     Resp Syncytial Virus by PCR NEGATIVE NEGATIVE Final    Comment: (NOTE) Fact Sheet for Patients: BloggerCourse.com  Fact Sheet for Healthcare Providers: SeriousBroker.it  This test is not yet approved or cleared by the [place]  FDA and has been authorized for detection and/or diagnosis of SARS-CoV-2 by FDA under an Emergency Use Authorization (EUA). This EUA will remain in effect (meaning this test can be used) for the duration of the COVID-19 declaration under Section 564(b)(1) of the Act, 21 U.S.C. section 360bbb-3(b)(1), unless the authorization is terminated or revoked.  Performed at [facility], [address]  [place], [place] [address]   Blood culture (routine x 2)     Status: None (Preliminary result)   Collection Time: 07/23/24  7:38 PM   Specimen: BLOOD LEFT ARM  Result Value Ref Range Status   Specimen Description BLOOD LEFT ARM  Final   Special Requests   Final    BOTTLES DRAWN AEROBIC AND ANAEROBIC Blood Culture results may not be optimal due to an inadequate volume of blood received in culture bottles   Culture   Final    NO GROWTH 2 DAYS Performed at [facility], [address], [place], [place] [address]    Report Status PENDING  Incomplete  Respiratory (~20 pathogens) panel by PCR     Status: None   Collection Time: 07/24/24  7:32 AM   Specimen: Nasopharyngeal Swab; Respiratory  Result Value Ref Range Status   Adenovirus NOT DETECTED NOT DETECTED Final   Coronavirus 229E NOT DETECTED NOT DETECTED Final    Comment: (NOTE) The Coronavirus on the Respiratory Panel, DOES NOT test for the novel  Coronavirus (2019 nCoV)    Coronavirus HKU1 NOT DETECTED NOT DETECTED Final   Coronavirus NL63 NOT DETECTED NOT DETECTED Final   Coronavirus OC43 NOT DETECTED NOT DETECTED Final   Metapneumovirus NOT DETECTED NOT DETECTED Final   Rhinovirus / Enterovirus NOT DETECTED NOT DETECTED Final  Influenza A NOT DETECTED NOT DETECTED Final   Influenza B NOT DETECTED NOT DETECTED Final   Parainfluenza Virus 1 NOT DETECTED NOT DETECTED Final   Parainfluenza Virus 2 NOT DETECTED NOT DETECTED Final   Parainfluenza Virus 3 NOT DETECTED NOT DETECTED Final   Parainfluenza Virus 4 NOT DETECTED NOT DETECTED Final   Respiratory Syncytial Virus NOT DETECTED NOT DETECTED Final   Bordetella pertussis NOT DETECTED NOT DETECTED Final   Bordetella Parapertussis NOT DETECTED NOT DETECTED Final   Chlamydophila pneumoniae NOT DETECTED NOT DETECTED Final   Mycoplasma pneumoniae NOT DETECTED NOT DETECTED Final    Comment: Performed at [facility], [address] [place], [place] [address]    RADIOLOGY  STUDIES/RESULTS: DG Abd Portable 1V-Small Bowel Obstruction Protocol-initial, 8 hr delay Result Date: 07/24/2024 CLINICAL DATA:  Small-bowel obstruction EXAM: PORTABLE ABDOMEN - 1 VIEW COMPARISON:  Abdominal x-ray 07/24/2024. CT abdomen and pelvis 07/23/2024. FINDINGS: Dilated central small bowel loops measuring up to 4.3 cm are unchanged. Air and contrast are seen throughout nondilated colon to the level the rectum. Enteric tube tip is in the mid body of the stomach. Phleboliths are present. Vascular stent is seen in the right lower extremity. No suspicious calcifications are identified. Severe degenerative changes affect the spine. IMPRESSION: Unchanged dilated central small bowel loops concerning for small bowel obstruction. Electronically Signed   By: Greig Pique M.D.   On: 07/24/2024 17:16   ECHOCARDIOGRAM COMPLETE Result Date: 07/24/2024    ECHOCARDIOGRAM REPORT   Patient Name:   JERRIT HOREN Date of Exam: 07/24/2024 Medical Rec #:  [id number]        Height:       72.0 in Accession #:    [id number]       Weight:       229.3 lb Date of Birth:  [birth year]        BSA:          2.258 m Patient Age:    83 years         BP:           109/52 mmHg Patient Gender: M                HR:           60 bpm. Exam Location:  Inpatient Procedure: 2D Echo, Cardiac Doppler, Color Doppler and Intracardiac            Opacification Agent (Both Spectral and Color Flow Doppler were            utilized during procedure). Indications:    CHF-Acute Systolic I50.21  History:        Patient has prior history of Echocardiogram examinations, most                 recent 04/11/2024. CHF, CAD and Previous Myocardial Infarction,                 Prior CABG, PAD and CKD, Stage 3, Arrythmias:Atrial Fibrillation                 and RBBB, Signs/Symptoms:Hypotension, Shortness of Breath and                 Syncope; Risk Factors:Hypertension, Sleep Apnea and Diabetes.                 Aortic Valve: 29 mm a native pulmonic valve is present in the  aortic position. Procedure Date: 12/09/23.  Sonographer:    Thea Norlander RCS Referring Phys: [id number] ANASTASSIA DOUTOVA IMPRESSIONS  1. Left ventricular ejection fraction, by estimation, is 30 to 35%. The left ventricle has moderately decreased function. Left ventricular endocardial border not optimally defined to evaluate regional wall motion. The left ventricular internal cavity size was mildly dilated. There is mild concentric left ventricular hypertrophy. Left ventricular diastolic parameters are consistent with Grade I diastolic dysfunction (impaired relaxation).  2. Right ventricular systolic function is moderately reduced. The right ventricular size is mildly enlarged.  3. Left atrial size was moderately dilated.  4. Right atrial size was mildly dilated.  5. The mitral valve is normal in structure. Mild to moderate mitral valve regurgitation. No evidence of mitral stenosis.  6. The aortic valve is normal in structure. Aortic valve regurgitation is not visualized. No aortic stenosis is present. There is a 29 mm a native pulmonic valve present in the aortic position. Procedure Date: 12/09/23.  7. Aortic dilatation noted. There is borderline dilatation of the ascending aorta, measuring 38 mm.  8. The inferior vena cava is dilated in size with <50% respiratory variability, suggesting right atrial pressure of 15 mmHg. FINDINGS  Left Ventricle: Left ventricular ejection fraction, by estimation, is 30 to 35%. The left ventricle has moderately decreased function. Left ventricular endocardial border not optimally defined to evaluate regional wall motion. Definity  contrast agent was given IV to delineate the left ventricular endocardial borders. The left ventricular internal cavity size was mildly dilated. There is mild concentric left ventricular hypertrophy. Left ventricular diastolic parameters are consistent with Grade I diastolic dysfunction (impaired relaxation). Right Ventricle: The right  ventricular size is mildly enlarged. No increase in right ventricular wall thickness. Right ventricular systolic function is moderately reduced. Left Atrium: Left atrial size was moderately dilated. Right Atrium: Right atrial size was mildly dilated. Pericardium: There is no evidence of pericardial effusion. Mitral Valve: The mitral valve is normal in structure. Mild mitral annular calcification. Mild to moderate mitral valve regurgitation. No evidence of mitral valve stenosis. Tricuspid Valve: The tricuspid valve is normal in structure. Tricuspid valve regurgitation is trivial. No evidence of tricuspid stenosis. Aortic Valve: The aortic valve is normal in structure. Aortic valve regurgitation is not visualized. No aortic stenosis is present. Aortic valve peak gradient measures 10.0 mmHg. There is a 29 mm a native pulmonic valve present in the aortic position. Procedure Date: 12/09/23. Pulmonic Valve: The pulmonic valve was normal in structure. Pulmonic valve regurgitation is not visualized. No evidence of pulmonic stenosis. Aorta: Aortic dilatation noted. There is borderline dilatation of the ascending aorta, measuring 38 mm. Venous: The inferior vena cava is dilated in size with less than 50% respiratory variability, suggesting right atrial pressure of 15 mmHg. IAS/Shunts: No atrial level shunt detected by color flow Doppler.  LEFT VENTRICLE PLAX 2D LVIDd:         5.60 cm   Diastology LVIDs:         4.50 cm   LV e' medial:    5.33 cm/s LV PW:         1.40 cm   LV E/e' medial:  16.2 LV IVS:        1.10 cm   LV e' lateral:   5.68 cm/s LVOT diam:     2.30 cm   LV E/e' lateral: 15.2 LV SV:         86 LV SV Index:   38 LVOT Area:     4.15  cm  RIGHT VENTRICLE            IVC RV S prime:     6.16 cm/s  IVC diam: 2.30 cm TAPSE (M-mode): 1.2 cm LEFT ATRIUM           Index        RIGHT ATRIUM           Index LA diam:      4.70 cm 2.08 cm/m   RA Area:     23.40 cm LA Vol (A2C): 62.5 ml 27.68 ml/m  RA Volume:   69.60 ml   30.82 ml/m LA Vol (A4C): 62.7 ml 27.77 ml/m  AORTIC VALVE AV Area (Vmax): 2.89 cm AV Vmax:        158.00 cm/s AV Peak Grad:   10.0 mmHg LVOT Vmax:      110.00 cm/s LVOT Vmean:     74.400 cm/s LVOT VTI:       0.208 m  AORTA Ao Root diam: 3.10 cm Ao Asc diam:  3.80 cm MITRAL VALVE MV Area (PHT): 3.08 cm    SHUNTS MV Decel Time: 246 msec    Systemic VTI:  0.21 m MV E velocity: 86.20 cm/s  Systemic Diam: 2.30 cm MV A velocity: 97.90 cm/s MV E/A ratio:  0.88 Toribio Fuel MD Electronically signed by Toribio Fuel MD Signature Date/Time: 07/24/2024/12:21:20 PM    Final    DG Abdomen 1 View Result Date: 07/24/2024 CLINICAL DATA:  NG tube placement. EXAM: ABDOMEN - 1 VIEW COMPARISON:  01/06/2018 FINDINGS: NG tube tip is in the stomach with proximal side port below the GE junction. Visualized upper abdomen demonstrates nonspecific bowel gas pattern. IMPRESSION: NG tube tip is in the stomach. Electronically Signed   By: Camellia Candle M.D.   On: 07/24/2024 05:23   CT Angio Chest PE W and/or Wo Contrast Result Date: 07/23/2024 CLINICAL DATA:  Chest pain and shortness of breath. Concern for pulmonary disease. EXAM: CT ANGIOGRAPHY CHEST WITH CONTRAST TECHNIQUE: Multidetector CT imaging of the chest was performed using the standard protocol during bolus administration of intravenous contrast. Multiplanar CT image reconstructions and MIPs were obtained to evaluate the vascular anatomy. RADIATION DOSE REDUCTION: This exam was performed according to the departmental dose-optimization program which includes automated exposure control, adjustment of the mA and/or kV according to patient size and/or use of iterative reconstruction technique. CONTRAST:  75mL OMNIPAQUE  IOHEXOL  350 MG/ML SOLN COMPARISON:  Chest CT dated 05/31/2024. FINDINGS: Evaluation of this exam is limited due to respiratory motion. Cardiovascular: There is mild cardiomegaly. No pericardial effusion. There is coronary vascular calcification. Aortic valve  repair. Mild atherosclerotic calcification of the thoracic aorta. No aneurysmal dilatation. Evaluation of the pulmonary arteries is limited due to respiratory motion as well as streak artifact caused by patient's arms. No porta artery embolus identified. Mediastinum/Nodes: Mildly enlarged right hilar lymph nodes measure 11 mm. The esophagus is grossly unremarkable no mediastinal fluid collection. Lungs/Pleura: Diffuse interstitial coarsening and fibrosis in keeping with interstitial lung disease. Small left pleural effusion. No focal consolidation or pneumothorax. The central airways are patent. Upper Abdomen: No acute abnormality. Musculoskeletal: Osteopenia with degenerative changes. Median sternotomy wires. No acute osseous pathology. Review of the MIP images confirms the above findings. IMPRESSION: 1. No CT evidence of pulmonary artery embolus. 2. Interstitial lung disease. 3. Small left pleural effusion. 4.  Aortic Atherosclerosis (ICD10-I70.0). Electronically Signed   By: Vanetta Chou M.D.   On: 07/23/2024 19:31   CT ABDOMEN PELVIS W CONTRAST  Result Date: 07/23/2024 CLINICAL DATA:  Nausea and vomiting EXAM: CT ABDOMEN AND PELVIS WITH CONTRAST TECHNIQUE: Multidetector CT imaging of the abdomen and pelvis was performed using the standard protocol following bolus administration of intravenous contrast. RADIATION DOSE REDUCTION: This exam was performed according to the departmental dose-optimization program which includes automated exposure control, adjustment of the mA and/or kV according to patient size and/or use of iterative reconstruction technique. CONTRAST:  75mL OMNIPAQUE  IOHEXOL  350 MG/ML SOLN COMPARISON:  CT abdomen and pelvis 12/30/2023. FINDINGS: Lower chest: Chronic interstitial changes are again noted in the lung bases. The heart is enlarged. Hepatobiliary: No focal liver abnormality is seen. No gallstones, gallbladder wall thickening, or biliary dilatation. Pancreas: Unremarkable. No  pancreatic ductal dilatation or surrounding inflammatory changes. Spleen: Normal in size without focal abnormality. Adrenals/Urinary Tract: There is no hydronephrosis or perinephric stranding. No perinephric fluid collection identified. Knee adrenal glands and bladder are within normal limits. Stomach/Bowel: There are mildly dilated central small bowel loops. No wall thickening or inflammation. No transition point visualized. The colon is nondilated. There is diffuse colonic diverticulosis. The appendix is normal. Stomach is within normal limits. Vascular/Lymphatic: Aortic atherosclerosis. No enlarged abdominal or pelvic lymph nodes. Reproductive: Prostate is unremarkable. Other: There is no free air. There is trace free fluid in the left upper quadrant previously identified right retroperitoneal hematoma has significantly decreased in size. Enhancing low-attenuation areas seen in the right retroperitoneum measuring 9.1 x 2.7 x 5.3 cm. There is no focal abdominal wall hernia. Musculoskeletal: Degenerative changes affect the spine the hips. There are laminectomy defects at L3-L4. IMPRESSION: 1. Mildly dilated central small bowel loops without transition point. Findings may represent ileus or partial small bowel obstruction. 2. Trace free fluid in the left upper quadrant. 3. Enhancing low-attenuation collection in the right retroperitoneum is most likely related to resolving hematoma. Infection not excluded. 4. Colonic diverticulosis. Aortic Atherosclerosis (ICD10-I70.0). Electronically Signed   By: Greig Pique M.D.   On: 07/23/2024 19:30   DG Chest Portable 1 View Result Date: 07/23/2024 CLINICAL DATA:  Short of breath, vomiting EXAM: PORTABLE CHEST 1 VIEW COMPARISON:  04/11/2024 FINDINGS: Two frontal views of the chest demonstrate stable postsurgical changes from CABG and aortic valve replacement. Cardiac silhouette remains enlarged. Chronic background pulmonary scarring and fibrosis, with increased pulmonary  vascular congestion and patchy bibasilar consolidation consistent with superimposed edema. Small bilateral effusions. No pneumothorax. IMPRESSION: 1. Congestive heart failure superimposed upon chronic pulmonary fibrosis and scarring. Electronically Signed   By: Ozell Daring M.D.   On: 07/23/2024 18:24     LOS: 2 days   Donalda Applebaum, MD  [facility]    To contact the attending provider between 7A-7P or the covering provider during after hours 7P-7A, please log into the web site [email or web address] and access using universal [place] password for that web site. If you do not have the password, please call the hospital operator.  07/25/2024, 10:09 AM

## 2024-07-25 NOTE — Progress Notes (Signed)
 PHARMACY - ANTICOAGULATION CONSULT NOTE  Pharmacy Consult for heparin  Indication: atrial fibrillation, hx of PE  Allergies  Allergen Reactions   Codeine Hives and Itching   Ofev  [Nintedanib ] Diarrhea    SEVERE DIARRHEA   Pirfenidone  Diarrhea and Other (See Comments)    Esbriet  (Pirfenidone ) causes elevated LFTs. D/C on 06/14/17 and SEVERE DIARRHEA    Amoxicillin Itching    Patient Measurements: Height: 6' (182.9 cm) Weight: 104 kg (229 lb 4.5 oz) (Wt from 06/25/2024) IBW/kg (Calculated) : 77.6 HEPARIN  DW (KG): 99.1  Vital Signs: Temp: 98.7 F (37.1 C) (08/21 1147) Temp Source: Oral (08/21 1147) BP: 114/71 (08/21 1147) Pulse Rate: 62 (08/21 1147)  Labs: Recent Labs    07/23/24 1731 07/23/24 1737 07/23/24 1831 07/23/24 1938 07/23/24 2353 07/24/24 0019 07/24/24 0158 07/24/24 1000 07/24/24 1036 07/24/24 1100 07/24/24 1412 07/24/24 1651 07/25/24 0022 07/25/24 0442 07/25/24 1046  HGB 14.7   < > 15.6  --   --  11.9*  --   --  11.9*  --   --   --   --   --   --   HCT 44.8   < > 46.0  --   --  35.0*  --   --  36.8*  --   --   --   --   --   --   PLT 245  --   --   --   --   --   --   --  170  --   --   --   --   --   --   APTT  --   --   --   --   --   --   --   --   --  150*  --   --  122*  --  57*  HEPARINUNFRC  --   --   --   --   --   --   --  >1.10*  --   --   --   --  >1.10*  --  1.00*  CREATININE 1.67*  --  1.70*  --   --   --   --   --   --   --   --   --   --  1.61*  --   CKTOTAL  --   --   --   --  307  --   --   --   --   --   --   --   --   --   --   TROPONINIHS 18*  --   --    < > 3,304*  --    < >  --  5,053*  --  5,648* 4,867*  --   --   --    < > = values in this interval not displayed.    Estimated Creatinine Clearance: 43.4 mL/min (A) (by C-G formula based on SCr of 1.61 mg/dL (H)).  Medications:  -Eliquis  5mg  PO BID (Last dose 8/19 AM)  Assessment: 48 yoM presented to ED with emesis, hypotension, and SOB. Pharmacy consulted to dose heparin  for  afib/hx of PE. 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   -5/25: NM perfusion indicated high probability of PE, CTa this admission showing no acute PE -CT abdomen: ileus vs partial SBO + resolving hematoma in R. Retroperitoneum   APTT now below goal this AM.  No overt bleeding or complications noted, no known issues with IV infusion.  Goal of Therapy:  Heparin  level 0.3-0.7 units/ml aPTT 66-102 seconds Monitor platelets by anticoagulation protocol: Yes   Plan:  Increase IV heparin  to 1100 units/hr. Check aPTT/anti-Xa level in 8 hours and daily while on heparin  Monitor with aPTT until correlates with heparin  level Continue to monitor H&H and platelets Follow up transition back to Eliquis    Harlene Barlow, Berdine BIRCH, BCPS, BCCP Clinical Pharmacist  07/25/2024 12:14 PM   [facility] pharmacy phone numbers are listed on [email or web address]

## 2024-07-25 NOTE — TOC Progression Note (Signed)
 Transition of Care [facility]) - Progression Note    Patient Details  Name: Shane [name] MRN: [id number] Date of Birth: 06-08-1941  Transition of Care [facility]) CM/SW Contact  Corean JAYSON Canary, RN Phone Number: 07/25/2024, 4:09 PM  Clinical Narrative:    Spoke with patient at bedside introduced self. Patient states he has all DME needed, and he has [place] for home health. Messaged Cory from [place] to ask which disciplines. He is normally on 2LPM of oxygen at home  he obtained his DME via adapt.  He states his wife can transport him home when discharged. Plan on DC tomorrow, the patient is currently at 3LPM of oxygen, states he had a little breathing difficulty earlier.   TOC will continue to follow for needs, recommendations, and transitions of care   Expected Discharge Plan: Home w Home Health Services Barriers to Discharge: Continued Medical Work up               Expected Discharge Plan and Services   Discharge Planning Services: CM Consult Post Acute Care Choice: Home Health Living arrangements for the past 2 months: Single Family Home                             HH Agency: [facility] Date [facility] Agency Contacted: 07/25/24 Time HH Agency Contacted: [id number] Representative spoke with at [facility] Agency: Darleene   Social Drivers of Health (SDOH) Interventions SDOH Screenings   Food Insecurity: No Food Insecurity (07/24/2024)  Housing: Low Risk  (07/24/2024)  Transportation Needs: No Transportation Needs (07/24/2024)  Utilities: Not At Risk (07/24/2024)  Depression (PHQ2-9): Low Risk  (01/11/2021)  Financial Resource Strain: Low Risk  (08/22/2023)   Received from [facility]  Social Connections: Moderately Integrated (07/24/2024)  Tobacco Use: Low Risk  (07/23/2024)    Readmission Risk Interventions    12/15/2023    2:29 PM  Readmission Risk Prevention Plan  Transportation Screening Complete  Medication Review (RN Care Manager) Complete  PCP or  Specialist appointment within 3-5 days of discharge Complete  HRI or Home Care Consult Complete  SW Recovery Care/Counseling Consult Complete  Palliative Care Screening Complete  Skilled Nursing Facility Not Applicable

## 2024-07-25 NOTE — Progress Notes (Signed)
 Progress Note     Subjective: Patient reports some abdominal tenderness. Feels that this has improved. Tolerating CLD without nausea or vomiting. Reports 3 bowel movements overnight that were loose. Reports flatulence.   ROS  All negative with the exception of above.  Objective: Vital signs in last 24 hours: Temp:  [97.6 F (36.4 C)-98.6 F (37 C)] 98.6 F (37 C) (08/21 0858) Pulse Rate:  [57-77] 75 (08/21 0753) Resp:  [8-24] 17 (08/21 0753) BP: (94-134)/(56-75) 134/64 (08/21 0753) SpO2:  [97 %-100 %] 99 % (08/21 0753) Last BM Date : 07/24/24  Intake/Output from previous day: 08/20 0701 - 08/21 0700 In: -  Out: 1410 [Urine:1400; Emesis/NG output:10] Intake/Output this shift: No intake/output data recorded.  PE: General: Pleasant male who is laying in bed in NAD. Lungs: Respiratory effort nonlabored. Abd: Soft with some distention. Mild generalized tenderness to palpation that is more prominent of the LUQ and LLQ Psych: A&Ox3 with an appropriate affect.    Lab Results:  Recent Labs    07/23/24 1731 07/23/24 1737 07/24/24 0019 07/24/24 1036  WBC 15.1*  --   --  12.0*  HGB 14.7   < > 11.9* 11.9*  HCT 44.8   < > 35.0* 36.8*  PLT 245  --   --  170   < > = values in this interval not displayed.   BMET Recent Labs    07/23/24 1731 07/23/24 1737 07/23/24 1831 07/24/24 0019 07/25/24 0442  NA 134*   < > 136 133* 137  K 4.9   < > 4.7 5.3* 4.1  CL 95*  --  101  --  104  CO2 23  --   --   --  23  GLUCOSE 158*  --  153*  --  150*  BUN 53*  --  76*  --  44*  CREATININE 1.67*  --  1.70*  --  1.61*  CALCIUM  8.1*  --   --   --  8.2*   < > = values in this interval not displayed.   PT/INR No results for input(s): LABPROT, INR in the last 72 hours. CMP     Component Value Date/Time   NA 137 07/25/2024 0442   NA 137 06/30/2020 1545   K 4.1 07/25/2024 0442   CL 104 07/25/2024 0442   CO2 23 07/25/2024 0442   GLUCOSE 150 (H) 07/25/2024 0442   BUN 44 (H)  07/25/2024 0442   BUN 26 06/30/2020 1545   CREATININE 1.61 (H) 07/25/2024 0442   CREATININE 1.35 (H) 11/07/2016 1044   CALCIUM  8.2 (L) 07/25/2024 0442   PROT 6.0 (L) 07/25/2024 0442   ALBUMIN  2.6 (L) 07/25/2024 0442   AST 48 (H) 07/25/2024 0442   ALT 21 07/25/2024 0442   ALKPHOS 62 07/25/2024 0442   BILITOT 0.7 07/25/2024 0442   GFRNONAA 42 (L) 07/25/2024 0442   GFRAA 53 (L) 07/17/2020 1300   Lipase     Component Value Date/Time   LIPASE 33 07/23/2024 1731       Studies/Results: DG Abd Portable 1V-Small Bowel Obstruction Protocol-initial, 8 hr delay Result Date: 07/24/2024 CLINICAL DATA:  Small-bowel obstruction EXAM: PORTABLE ABDOMEN - 1 VIEW COMPARISON:  Abdominal x-ray 07/24/2024. CT abdomen and pelvis 07/23/2024. FINDINGS: Dilated central small bowel loops measuring up to 4.3 cm are unchanged. Air and contrast are seen throughout nondilated colon to the level the rectum. Enteric tube tip is in the mid body of the stomach. Phleboliths are present. Vascular stent is  seen in the right lower extremity. No suspicious calcifications are identified. Severe degenerative changes affect the spine. IMPRESSION: Unchanged dilated central small bowel loops concerning for small bowel obstruction. Electronically Signed   By: Greig Pique M.D.   On: 07/24/2024 17:16   ECHOCARDIOGRAM COMPLETE Result Date: 07/24/2024    ECHOCARDIOGRAM REPORT   Patient Name:   Shane [name] Date of Exam: 07/24/2024 Medical Rec #:  [id number]        Height:       72.0 in Accession #:    [id number]       Weight:       229.3 lb Date of Birth:  05-11-1941        BSA:          2.258 m Patient Age:    83 years         BP:           109/52 mmHg Patient Gender: M                HR:           60 bpm. Exam Location:  Inpatient Procedure: 2D Echo, Cardiac Doppler, Color Doppler and Intracardiac            Opacification Agent (Both Spectral and Color Flow Doppler were            utilized during procedure). Indications:     CHF-Acute Systolic I50.21  History:        Patient has prior history of Echocardiogram examinations, most                 recent 04/11/2024. CHF, CAD and Previous Myocardial Infarction,                 Prior CABG, PAD and CKD, Stage 3, Arrythmias:Atrial Fibrillation                 and RBBB, Signs/Symptoms:Hypotension, Shortness of Breath and                 Syncope; Risk Factors:Hypertension, Sleep Apnea and Diabetes.                 Aortic Valve: 29 mm a native pulmonic valve is present in the                 aortic position. Procedure Date: 12/09/23.  Sonographer:    Thea Norlander RCS Referring Phys: [id number] ANASTASSIA DOUTOVA IMPRESSIONS  1. Left ventricular ejection fraction, by estimation, is 30 to 35%. The left ventricle has moderately decreased function. Left ventricular endocardial border not optimally defined to evaluate regional wall motion. The left ventricular internal cavity size was mildly dilated. There is mild concentric left ventricular hypertrophy. Left ventricular diastolic parameters are consistent with Grade I diastolic dysfunction (impaired relaxation).  2. Right ventricular systolic function is moderately reduced. The right ventricular size is mildly enlarged.  3. Left atrial size was moderately dilated.  4. Right atrial size was mildly dilated.  5. The mitral valve is normal in structure. Mild to moderate mitral valve regurgitation. No evidence of mitral stenosis.  6. The aortic valve is normal in structure. Aortic valve regurgitation is not visualized. No aortic stenosis is present. There is a 29 mm a native pulmonic valve present in the aortic position. Procedure Date: 12/09/23.  7. Aortic dilatation noted. There is borderline dilatation of the ascending aorta, measuring 38 mm.  8. The inferior vena cava is dilated in size with <50%  respiratory variability, suggesting right atrial pressure of 15 mmHg. FINDINGS  Left Ventricle: Left ventricular ejection fraction, by estimation, is 30 to 35%. The  left ventricle has moderately decreased function. Left ventricular endocardial border not optimally defined to evaluate regional wall motion. Definity  contrast agent was given IV to delineate the left ventricular endocardial borders. The left ventricular internal cavity size was mildly dilated. There is mild concentric left ventricular hypertrophy. Left ventricular diastolic parameters are consistent with Grade I diastolic dysfunction (impaired relaxation). Right Ventricle: The right ventricular size is mildly enlarged. No increase in right ventricular wall thickness. Right ventricular systolic function is moderately reduced. Left Atrium: Left atrial size was moderately dilated. Right Atrium: Right atrial size was mildly dilated. Pericardium: There is no evidence of pericardial effusion. Mitral Valve: The mitral valve is normal in structure. Mild mitral annular calcification. Mild to moderate mitral valve regurgitation. No evidence of mitral valve stenosis. Tricuspid Valve: The tricuspid valve is normal in structure. Tricuspid valve regurgitation is trivial. No evidence of tricuspid stenosis. Aortic Valve: The aortic valve is normal in structure. Aortic valve regurgitation is not visualized. No aortic stenosis is present. Aortic valve peak gradient measures 10.0 mmHg. There is a 29 mm a native pulmonic valve present in the aortic position. Procedure Date: 12/09/23. Pulmonic Valve: The pulmonic valve was normal in structure. Pulmonic valve regurgitation is not visualized. No evidence of pulmonic stenosis. Aorta: Aortic dilatation noted. There is borderline dilatation of the ascending aorta, measuring 38 mm. Venous: The inferior vena cava is dilated in size with less than 50% respiratory variability, suggesting right atrial pressure of 15 mmHg. IAS/Shunts: No atrial level shunt detected by color flow Doppler.  LEFT VENTRICLE PLAX 2D LVIDd:         5.60 cm   Diastology LVIDs:         4.50 cm   LV e' medial:    5.33 cm/s  LV PW:         1.40 cm   LV E/e' medial:  16.2 LV IVS:        1.10 cm   LV e' lateral:   5.68 cm/s LVOT diam:     2.30 cm   LV E/e' lateral: 15.2 LV SV:         86 LV SV Index:   38 LVOT Area:     4.15 cm  RIGHT VENTRICLE            IVC RV S prime:     6.16 cm/s  IVC diam: 2.30 cm TAPSE (M-mode): 1.2 cm LEFT ATRIUM           Index        RIGHT ATRIUM           Index LA diam:      4.70 cm 2.08 cm/m   RA Area:     23.40 cm LA Vol (A2C): 62.5 ml 27.68 ml/m  RA Volume:   69.60 ml  30.82 ml/m LA Vol (A4C): 62.7 ml 27.77 ml/m  AORTIC VALVE AV Area (Vmax): 2.89 cm AV Vmax:        158.00 cm/s AV Peak Grad:   10.0 mmHg LVOT Vmax:      110.00 cm/s LVOT Vmean:     74.400 cm/s LVOT VTI:       0.208 m  AORTA Ao Root diam: 3.10 cm Ao Asc diam:  3.80 cm MITRAL VALVE MV Area (PHT): 3.08 cm    SHUNTS MV Decel Time: 246 msec  Systemic VTI:  0.21 m MV E velocity: 86.20 cm/s  Systemic Diam: 2.30 cm MV A velocity: 97.90 cm/s MV E/A ratio:  0.88 Toribio Fuel MD Electronically signed by Toribio Fuel MD Signature Date/Time: 07/24/2024/12:21:20 PM    Final    DG Abdomen 1 View Result Date: 07/24/2024 CLINICAL DATA:  NG tube placement. EXAM: ABDOMEN - 1 VIEW COMPARISON:  01/06/2018 FINDINGS: NG tube tip is in the stomach with proximal side port below the GE junction. Visualized upper abdomen demonstrates nonspecific bowel gas pattern. IMPRESSION: NG tube tip is in the stomach. Electronically Signed   By: Camellia Candle M.D.   On: 07/24/2024 05:23   CT Angio Chest PE W and/or Wo Contrast Result Date: 07/23/2024 CLINICAL DATA:  Chest pain and shortness of breath. Concern for pulmonary disease. EXAM: CT ANGIOGRAPHY CHEST WITH CONTRAST TECHNIQUE: Multidetector CT imaging of the chest was performed using the standard protocol during bolus administration of intravenous contrast. Multiplanar CT image reconstructions and MIPs were obtained to evaluate the vascular anatomy. RADIATION DOSE REDUCTION: This exam was performed  according to the departmental dose-optimization program which includes automated exposure control, adjustment of the mA and/or kV according to patient size and/or use of iterative reconstruction technique. CONTRAST:  75mL OMNIPAQUE  IOHEXOL  350 MG/ML SOLN COMPARISON:  Chest CT dated 05/31/2024. FINDINGS: Evaluation of this exam is limited due to respiratory motion. Cardiovascular: There is mild cardiomegaly. No pericardial effusion. There is coronary vascular calcification. Aortic valve repair. Mild atherosclerotic calcification of the thoracic aorta. No aneurysmal dilatation. Evaluation of the pulmonary arteries is limited due to respiratory motion as well as streak artifact caused by patient's arms. No porta artery embolus identified. Mediastinum/Nodes: Mildly enlarged right hilar lymph nodes measure 11 mm. The esophagus is grossly unremarkable no mediastinal fluid collection. Lungs/Pleura: Diffuse interstitial coarsening and fibrosis in keeping with interstitial lung disease. Small left pleural effusion. No focal consolidation or pneumothorax. The central airways are patent. Upper Abdomen: No acute abnormality. Musculoskeletal: Osteopenia with degenerative changes. Median sternotomy wires. No acute osseous pathology. Review of the MIP images confirms the above findings. IMPRESSION: 1. No CT evidence of pulmonary artery embolus. 2. Interstitial lung disease. 3. Small left pleural effusion. 4.  Aortic Atherosclerosis (ICD10-I70.0). Electronically Signed   By: Vanetta Chou M.D.   On: 07/23/2024 19:31   CT ABDOMEN PELVIS W CONTRAST Result Date: 07/23/2024 CLINICAL DATA:  Nausea and vomiting EXAM: CT ABDOMEN AND PELVIS WITH CONTRAST TECHNIQUE: Multidetector CT imaging of the abdomen and pelvis was performed using the standard protocol following bolus administration of intravenous contrast. RADIATION DOSE REDUCTION: This exam was performed according to the departmental dose-optimization program which includes  automated exposure control, adjustment of the mA and/or kV according to patient size and/or use of iterative reconstruction technique. CONTRAST:  75mL OMNIPAQUE  IOHEXOL  350 MG/ML SOLN COMPARISON:  CT abdomen and pelvis 12/30/2023. FINDINGS: Lower chest: Chronic interstitial changes are again noted in the lung bases. The heart is enlarged. Hepatobiliary: No focal liver abnormality is seen. No gallstones, gallbladder wall thickening, or biliary dilatation. Pancreas: Unremarkable. No pancreatic ductal dilatation or surrounding inflammatory changes. Spleen: Normal in size without focal abnormality. Adrenals/Urinary Tract: There is no hydronephrosis or perinephric stranding. No perinephric fluid collection identified. Knee adrenal glands and bladder are within normal limits. Stomach/Bowel: There are mildly dilated central small bowel loops. No wall thickening or inflammation. No transition point visualized. The colon is nondilated. There is diffuse colonic diverticulosis. The appendix is normal. Stomach is within normal limits. Vascular/Lymphatic: Aortic atherosclerosis.  No enlarged abdominal or pelvic lymph nodes. Reproductive: Prostate is unremarkable. Other: There is no free air. There is trace free fluid in the left upper quadrant previously identified right retroperitoneal hematoma has significantly decreased in size. Enhancing low-attenuation areas seen in the right retroperitoneum measuring 9.1 x 2.7 x 5.3 cm. There is no focal abdominal wall hernia. Musculoskeletal: Degenerative changes affect the spine the hips. There are laminectomy defects at L3-L4. IMPRESSION: 1. Mildly dilated central small bowel loops without transition point. Findings may represent ileus or partial small bowel obstruction. 2. Trace free fluid in the left upper quadrant. 3. Enhancing low-attenuation collection in the right retroperitoneum is most likely related to resolving hematoma. Infection not excluded. 4. Colonic diverticulosis. Aortic  Atherosclerosis (ICD10-I70.0). Electronically Signed   By: Greig Pique M.D.   On: 07/23/2024 19:30   DG Chest Portable 1 View Result Date: 07/23/2024 CLINICAL DATA:  Short of breath, vomiting EXAM: PORTABLE CHEST 1 VIEW COMPARISON:  04/11/2024 FINDINGS: Two frontal views of the chest demonstrate stable postsurgical changes from CABG and aortic valve replacement. Cardiac silhouette remains enlarged. Chronic background pulmonary scarring and fibrosis, with increased pulmonary vascular congestion and patchy bibasilar consolidation consistent with superimposed edema. Small bilateral effusions. No pneumothorax. IMPRESSION: 1. Congestive heart failure superimposed upon chronic pulmonary fibrosis and scarring. Electronically Signed   By: Ozell Daring M.D.   On: 07/23/2024 18:24    Anti-infectives: Anti-infectives (From admission, onward)    Start     Dose/Rate Route Frequency Ordered Stop   07/24/24 1700  vancomycin  (VANCOREADY) IVPB 750 mg/150 mL  Status:  Discontinued        750 mg 150 mL/hr over 60 Minutes Intravenous Every 24 hours 07/24/24 0117 07/24/24 1704   07/24/24 0500  metroNIDAZOLE  (FLAGYL ) IVPB 500 mg  Status:  Discontinued        500 mg 100 mL/hr over 60 Minutes Intravenous Every 12 hours 07/24/24 0013 07/24/24 1704   07/24/24 0500  ceFEPIme  (MAXIPIME ) 2 g in sodium chloride  0.9 % 100 mL IVPB  Status:  Discontinued        2 g 200 mL/hr over 30 Minutes Intravenous Every 12 hours 07/24/24 0116 07/24/24 1704   07/23/24 1715  ceFEPIme  (MAXIPIME ) 2 g in sodium chloride  0.9 % 100 mL IVPB        2 g 200 mL/hr over 30 Minutes Intravenous  Once 07/23/24 1707 07/23/24 1921   07/23/24 1715  metroNIDAZOLE  (FLAGYL ) IVPB 500 mg        500 mg 100 mL/hr over 60 Minutes Intravenous  Once 07/23/24 1707 07/23/24 2046   07/23/24 1715  vancomycin  (VANCOCIN ) IVPB 1000 mg/200 mL premix  Status:  Discontinued        1,000 mg 200 mL/hr over 60 Minutes Intravenous  Once 07/23/24 1707 07/23/24 1709    07/23/24 1715  vancomycin  (VANCOREADY) IVPB 2000 mg/400 mL        2,000 mg 200 mL/hr over 120 Minutes Intravenous  Once 07/23/24 1709 07/23/24 2152        Assessment/Plan SBO vs ileus - CT from 8/19 showed ildus vs SBO. Trace free fluid in luq. - CTA from 8/19 showed no PE - SBO Protocol from 8/20 showed air and contrast seen throughout nondilated colon to the level of the rectum. Unchanged dilated central small bowel loops concerning for small bowel loops concerning for small bowel obstruction.  - Stable vitals. Afebrile. - WBC from 8/20 12.0; HGB from 8/20 11.9 - Per chart notes, NGT removed by  patient overnight.  - Had 3 bowel movements. Denies n/v since removal of NGT. Tolerating CLD. Will consider advancing diet as tolerated. - Will continue to follow.  FEN: CLD; Can advance to FLD this afternoon if continued tolerance; IVF per primary team VTE: SCDs, Heparin  ID: None currently    LOS: 2 days   I reviewed hospital team notes, specialist notes, nursing notes, last 24 h vitals and pain scores, last 48 h intake and output, last 24 h labs and trends, and last 24 h imaging results.  This care required moderate level of medical decision making.    Marjorie Carlyon Favre, [facility] Surgery 07/25/2024, 10:27 AM Please see Amion for pager number during day hours 7:00am-4:30pm

## 2024-07-25 NOTE — Consult Note (Signed)
 WOC Nurse Consult Note: Reason for Consult: Wound to Left foot. Per wife they are doing dressing changes at home. Also, Bil groin erythema  Patient admitted for vomiting and SOB; hx of COPD and pulmonary fibrosis. 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 He is followed monthly by Dr. Harden as well, last seen 07/04/24. Wife and patient perform wound care for ankle and residual limb, both areas are clean.  Pressure Injury POA: NA Measurement: 7/31; residual limb 1.5cm x 1.5cm x 0.1cm Ankle 1.0cm x 0.5cm x 0.1cm  Wound bed:100% clean, pink Drainage (amount, consistency, odor) see nursing flow sheets Periwound:intact  Dressing procedure/placement/frequency: Cleanse wounds with Vashe Soila # [phone]), moisten 2x2 (damp not soaked) with Vashe, cover with dry dressing. Secure with foam. Change daily or per patient's home routine.   FU with Dr. Harden as scheduled  Re consult if needed, will not follow at this time. Thanks  Fidencio Duddy [employer], RN,CWOCN, CNS, [employer] [phone]

## 2024-07-25 NOTE — Progress Notes (Signed)
 Heart Failure Navigator Progress Note  Assessed for Heart & Vascular TOC clinic readiness.  Patient does not meet criteria due to follows with [facility]. No HF TOC. .   Navigator will sign off at this time.   Stephane Haddock, BSN, Scientist, clinical (histocompatibility and immunogenetics) Only

## 2024-07-26 DIAGNOSIS — I482 Chronic atrial fibrillation, unspecified: Secondary | ICD-10-CM | POA: Diagnosis not present

## 2024-07-26 DIAGNOSIS — K56609 Unspecified intestinal obstruction, unspecified as to partial versus complete obstruction: Secondary | ICD-10-CM | POA: Diagnosis not present

## 2024-07-26 DIAGNOSIS — E119 Type 2 diabetes mellitus without complications: Secondary | ICD-10-CM | POA: Diagnosis not present

## 2024-07-26 DIAGNOSIS — I214 Non-ST elevation (NSTEMI) myocardial infarction: Secondary | ICD-10-CM | POA: Diagnosis not present

## 2024-07-26 LAB — GLUCOSE, CAPILLARY
Glucose-Capillary: 136 mg/dL — ABNORMAL HIGH (ref 70–99)
Glucose-Capillary: 133 mg/dL — ABNORMAL HIGH (ref 70–99)

## 2024-07-26 LAB — APTT
aPTT: 65 s — ABNORMAL HIGH (ref 24–36)
aPTT: 72 s — ABNORMAL HIGH (ref 24–36)

## 2024-07-26 LAB — BASIC METABOLIC PANEL WITH GFR
Anion gap: 12 (ref 5–15)
BUN: 30 mg/dL — ABNORMAL HIGH (ref 8–23)
CO2: 18 mmol/L — ABNORMAL LOW (ref 22–32)
Calcium: 8.1 mg/dL — ABNORMAL LOW (ref 8.9–10.3)
Chloride: 111 mmol/L (ref 98–111)
Creatinine, Ser: 1.12 mg/dL (ref 0.61–1.24)
GFR, Estimated: >60 mL/min (ref >60)
Glucose, Bld: 141 mg/dL — ABNORMAL HIGH (ref 70–99)
Potassium: 4.6 mmol/L (ref 3.5–5.1)
Sodium: 141 mmol/L (ref 135–145)

## 2024-07-26 LAB — CBC
HCT: 34.6 % — ABNORMAL LOW (ref 39.0–52.0)
Hemoglobin: 11.1 g/dL — ABNORMAL LOW (ref 13.0–17.0)
MCH: 30.8 pg (ref 26.0–34.0)
MCHC: 32.1 g/dL (ref 30.0–36.0)
MCV: 96.1 fL (ref 80.0–100.0)
Platelets: 152 K/uL (ref 150–400)
RBC: 3.6 MIL/uL — ABNORMAL LOW (ref 4.22–5.81)
RDW: 13.8 % (ref 11.5–15.5)
WBC: 12.7 K/uL — ABNORMAL HIGH (ref 4.0–10.5)
nRBC: 0 % (ref 0.0–0.2)

## 2024-07-26 LAB — HEPARIN LEVEL (UNFRACTIONATED)
Heparin Unfractionated: 0.53 [IU]/mL (ref 0.30–0.70)
Heparin Unfractionated: 0.7 [IU]/mL (ref 0.30–0.70)

## 2024-07-26 MED ORDER — APIXABAN 5 MG PO TABS: 5.0000 mg | ORAL_TABLET | Freq: Two times a day (BID) | ORAL | Status: DC

## 2024-07-26 MED ORDER — EPINEPHRINE 0.3 MG/0.3ML IJ SOAJ: 0.3000 mg | INTRAMUSCULAR | 1 refills | Status: DC | PRN

## 2024-07-26 NOTE — Discharge Summary (Addendum)
 PATIENT DETAILS Name: Shane [name] Age: 83 y.o. Sex: male Date of Birth: 04-11-1941 MRN: [id number]. Admitting Physician: Blease Quiver, MD ERE:Jmnwdnw, Charlie, MD  Admit Date: 07/23/2024 Discharge date: 07/26/2024  Recommendations for Outpatient Follow-up:  Follow up with PCP in 1-2 weeks Please obtain CMP/CBC in one week Please ensure follow-up with cardiology, dermatology  Admitted From:  Home  Disposition: Home health   Discharge Condition: fair  CODE STATUS:   Code Status: Full Code   Diet recommendation:  Diet Order             Diet - low sodium heart healthy           Diet regular Room service appropriate? Yes; Fluid consistency: Thin  Diet effective now                    Brief Summary: Patient is a 83 y.o.  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  However he was also found to have bowel obstruction and admitted to the hospitalist service.     Significant events: 8/19>> admit to [facility].  NG tube placed for SBO.  Given epinephrine  for presumed anaphylactic shock. 8/21>> patient removed NG tube.   Significant studies: 8/19>> CT abdomen/pelvis: Dilated central small bowel loops without transition point-possible partial SBO. 8/19>> CTA chest: No PE.  Interstitial lung disease. 8/20>> echo: EF 30-35%   Significant microbiology data: 8/19>> COVID/influenza/RSV PCR: Negative 8/19>> blood culture: No growth 8/20>> respiratory virus panel: Negative   Procedures: None   Consults: Cardiology General Surgery  Brief Hospital Course: Possible anaphylactic shock Hypotensive-hive-like rash (spouse has  pictures on her phone)-unclear what the provoking factor was.  Patient denies any new foods/meds-no clear-cut environmental trigger. Given epinephrine /antihistamine/steroids and significantly better.  BP now stable-no longer short of breath.  Hives have resolved. Given unexplained hives-outpatient dermatology evaluation recommended for allergy testing.  EpiPen  prescribed on discharge.   PSBO Did have vomiting and abdominal pain on initial presentation Imaging studies consistent with partial SBO NG tube was inserted but he pulled it out the night before-he was subsequently started on liquids which tolerated well and diet has not been advanced to regular diet which she has been tolerating without any issues General surgery followed-has cleared the patient for discharge.  Type II demand ischemia Secondary to hypotension Echo essentially unchanged from prior Treated with IV heparin  x 48 hours No further recommendations from cardiology Per cardiology note-they will arrange outpatient follow-up with his primary cardiologist.  Chronic HFrEF Volume status stable He will be discharged on his usual dosing of Lasix  and lisinopril  now that his blood pressure has stabilized.    History of sick sinus syndrome-s/p leadless pacemaker Telemetry monitoring was done while he was inpatient.   HTN BP stable-resuming both lisinopril /Lasix .   History of CAD s/p CABG 2021 History of aortic stenosis-s/p TAVR 2024 Stable-no anginal symptoms-elevated troponin secondary to demand ischemia. Continue aspirin /statin.   History of PE On IV heparin  due to demand ischemia-has been transition to Eliquis  prior to discharge.   History of recent retroperitoneal hematoma Per CT report-resolving.  Hb stable.   DM-2 (A1c 8.7 on 04/09/2024) CBG stable Resume usual outpatient regimen.  OSA CPAP nightly  PAD-s/p right right BKA, left TMA Stump appears benign Aspirin /statin   Mood disorder Continue Lexapro     Class 1 Obesity: Estimated body mass index is 31.1 kg/m as calculated from the following:   Height as of this encounter: 6' (1.829 m).   Weight as of this encounter: 104 kg.    Discharge Diagnoses:  Principal Problem:   SBO (small bowel obstruction) (HCC) Active Problems:   Atrial fibrillation, chronic (HCC)   NSTEMI (non-ST elevated myocardial infarction) (HCC)   Type 2 diabetes mellitus without complication, with long-term current use of insulin  (HCC)   OSA on CPAP   Coronary artery disease involving native heart without angina pectoris/ S/P CABG x3   LV dysfunction   S/P CABG x 3   SSS (sick sinus syndrome) (HCC)   S/P BKA (below knee amputation) unilateral, right (HCC)   Stage 3 chronic kidney disease (HCC)   PAF (paroxysmal atrial fibrillation) (HCC)   Retroperitoneal bleeding   History of pulmonary embolus (PE)   Anaphylactoid reaction   Elevated troponin I level   Discharge Instructions:  Activity:  As tolerated with Full fall precautions use walker/cane & assistance as needed  Discharge Instructions     Call MD for:  extreme fatigue   Complete by: As directed    Call MD for:  persistant dizziness or light-headedness   Complete by: As directed    Call MD for:  persistant nausea and vomiting   Complete by: As directed    Diet - low sodium heart healthy   Complete by: As directed    Discharge instructions   Complete by: As directed    Follow with Primary MD  Shepard Ade, MD in 1-2 weeks  Please get a complete blood count and chemistry panel checked by your Primary MD at your next visit, and again as instructed by your Primary MD.  Get Medicines reviewed and adjusted: Please take all your medications with you for your next visit with your Primary MD  Laboratory/radiological data: Please request your Primary MD to go over all hospital tests and procedure/radiological results at the follow up, please ask your Primary MD to get all Hospital records sent to  his/her office.  In some cases, they will be blood work, cultures and biopsy results pending at the time of your discharge. Please request that your primary care M.D. follows up on these results.  Also Note the following: If you experience worsening of your admission symptoms, develop shortness of breath, life threatening emergency, suicidal or homicidal thoughts you must seek medical attention immediately by calling 911 or calling your MD immediately  if symptoms less severe.  You must read complete instructions/literature along with all the possible adverse reactions/side effects for all the Medicines you take and that have been prescribed to you. Take any new Medicines after you have completely understood and accpet all the possible adverse reactions/side effects.   Do not drive when taking Pain medications or sleeping medications (Benzodaizepines)  Do not take more than prescribed Pain, Sleep and Anxiety Medications. It is not advisable to combine anxiety,sleep and pain medications without talking with your primary care practitioner  Special Instructions: If you have smoked or chewed Tobacco  in the last 2 yrs please stop smoking, stop any regular Alcohol  and or any Recreational drug use.  Wear Seat belts while driving.  Please note: You were cared for by a hospitalist during your hospital stay. Once you are discharged, your primary care physician will handle any further  medical issues. Please note that NO REFILLS for any discharge medications will be authorized once you are discharged, as it is imperative that you return to your primary care physician (or establish a relationship with a primary care physician if you do not have one) for your post hospital discharge needs so that they can reassess your need for medications and monitor your lab values.   Discharge wound care:   Complete by: As directed    Cleanse wounds with Vashe Soila # [phone]), moisten 2x2 (damp not soaked) with Vashe,  cover with dry dressing. Secure with foam. Change daily or per patient's home routine.   Increase activity slowly   Complete by: As directed       Allergies as of 07/26/2024       Reactions   Codeine Hives, Itching   Ofev  [nintedanib ] Diarrhea   SEVERE DIARRHEA   Pirfenidone  Diarrhea, Other (See Comments)   Esbriet  (Pirfenidone ) causes elevated LFTs. D/C on 06/14/17 and SEVERE DIARRHEA   Amoxicillin Itching        Medication List     TAKE these medications    albuterol  (2.5 MG/3ML) 0.083% nebulizer solution Commonly known as: PROVENTIL  Take 2.5 mg by nebulization 2 (two) times daily as needed for wheezing or shortness of breath. What changed: Another medication with the same name was changed. Make sure you understand how and when to take each.   albuterol  108 (90 Base) MCG/ACT inhaler Commonly known as: VENTOLIN  HFA Inhale 2 puffs into the lungs every 4 (four) hours as needed for wheezing or shortness of breath. What changed: how much to take   aspirin  EC 81 MG tablet Take 81 mg by mouth daily.   atorvastatin  80 MG tablet Commonly known as: LIPITOR  TAKE 1 TABLET BY MOUTH EVERY DAY What changed: when to take this   benzonatate  100 MG capsule Commonly known as: TESSALON  Take 1 capsule (100 mg total) by mouth 3 (three) times daily. What changed:  when to take this reasons to take this   cyanocobalamin  1000 MCG tablet Take 1 tablet (1,000 mcg total) by mouth daily. What changed: when to take this   dextromethorphan  30 MG/5ML liquid Commonly known as: DELSYM  Take 2.5 mLs (15 mg total) by mouth 2 (two) times daily.   docusate sodium  100 MG capsule Commonly known as: COLACE Take 1 capsule (100 mg total) by mouth 2 (two) times daily. What changed:  when to take this reasons to take this   Eliquis  5 MG Tabs tablet Generic drug: apixaban  Take 5 mg by mouth 2 (two) times daily.   EPINEPHrine  0.3 mg/0.3 mL Soaj injection Commonly known as: EPI-PEN Inject 0.3 mg  into the muscle as needed for anaphylaxis.   escitalopram  10 MG tablet Commonly known as: LEXAPRO  Take 1 tablet (10 mg total) by mouth daily. What changed: when to take this   feeding supplement (NEPRO CARB STEADY) Liqd Take 237 mLs by mouth 2 (two) times daily between meals. What changed:  when to take this additional instructions   furosemide  40 MG tablet Commonly known as: Lasix  Take 1 tablet (40 mg total) by mouth 2 (two) times daily.   gabapentin  100 MG capsule Commonly known as: NEURONTIN  Take 1 capsule (100 mg total) by mouth 3 (three) times daily.   HumaLOG  KwikPen 100 UNIT/ML KwikPen Generic drug: insulin  lispro Inject 10-15 Units into the skin 3 (three) times daily. Can take 15-18 Units with meals while on Prednisone  for 3days   HYDROcodone  bit-homatropine 5-1.5 MG/5ML  syrup Commonly known as: HYCODAN Take 5 mLs by mouth every 6 (six) hours as needed for cough.   HYDROcodone -acetaminophen  5-325 MG tablet Commonly known as: NORCO/VICODIN Take 1 tablet by mouth at bedtime as needed.   hydrOXYzine  25 MG tablet Commonly known as: ATARAX  Take 25 mg by mouth at bedtime.   lisinopril  10 MG tablet Commonly known as: ZESTRIL  Take 10 mg by mouth daily.   LORazepam  1 MG tablet Commonly known as: ATIVAN  Take 1 tablet (1 mg total) by mouth at bedtime as needed for sleep.   Magnesium  200 MG Tabs 1 tablet at bedtime Orally Once a day   methocarbamol  500 MG tablet Commonly known as: ROBAXIN  Take 1 tablet (500 mg total) by mouth every 8 (eight) hours as needed for muscle spasms.   multivitamin with minerals Tabs tablet Take 1 tablet by mouth in the morning.   mupirocin  ointment 2 % Commonly known as: BACTROBAN  Apply 1 Application topically at bedtime.   ondansetron  4 MG tablet Commonly known as: ZOFRAN  Take 4 mg by mouth as needed for nausea or vomiting.   OXYGEN Inhale 3 L into the lungs See admin instructions. Inhale 3L of Oxygen everynight at bedside. May  uses throughout the day as needed.   pantoprazole  40 MG tablet Commonly known as: PROTONIX  Take 1 tablet (40 mg total) by mouth 2 (two) times daily.   PATADAY OP Place 2 drops into both eyes daily as needed (allergies).   polyethylene glycol powder 17 GM/SCOOP powder Commonly known as: GLYCOLAX /MIRALAX  Take 17 g by mouth as needed for mild constipation or moderate constipation.   traZODone  50 MG tablet Commonly known as: DESYREL  Take 75 mg by mouth at bedtime as needed for sleep.   Tresiba  FlexTouch 100 UNIT/ML FlexTouch Pen Generic drug: insulin  degludec Inject 25 Units into the skin in the morning.   TYLENOL  PO Take 2 tablets by mouth as needed. May take 2 tablet by mouth 3 hours after taking the trazodone  75mg  prn.   vitamin C  with rose hips 500 MG tablet Take 1 tablet (500 mg total) by mouth daily. What changed: when to take this   zinc  sulfate (50mg  elemental zinc ) 220 (50 Zn) MG capsule Take 1 capsule (220 mg total) by mouth daily. What changed: when to take this               Discharge Care Instructions  (From admission, onward)           Start     Ordered   07/26/24 0000  Discharge wound care:       Comments: Cleanse wounds with Vashe Soila # [phone]), moisten 2x2 (damp not soaked) with Vashe, cover with dry dressing. Secure with foam. Change daily or per patient's home routine.   07/26/24 0946            Follow-up Information     [facility] Follow up.   Specialty: Home Health Services Why: They will call you to continue services Contact information: [address] [place] [address] [phone]         Shepard Ade, MD. Schedule an appointment as soon as possible for a visit in 1 week(s).   Specialty: Internal Medicine Contact information: [address] [place] [address] [phone]         Pietro Redell RAMAN, MD Follow up.   Specialty: Cardiology Why: Office will call with date/time, If  you dont hear from them,please give them a call Contact  information: [address] [place] [address] [phone]                Allergies  Allergen Reactions   Codeine Hives and Itching   Ofev  [Nintedanib ] Diarrhea    SEVERE DIARRHEA   Pirfenidone  Diarrhea and Other (See Comments)    Esbriet  (Pirfenidone ) causes elevated LFTs. D/C on 06/14/17 and SEVERE DIARRHEA    Amoxicillin Itching     Other Procedures/Studies: DG Abd Portable 1V-Small Bowel Obstruction Protocol-initial, 8 hr delay Result Date: 07/24/2024 CLINICAL DATA:  Small-bowel obstruction EXAM: PORTABLE ABDOMEN - 1 VIEW COMPARISON:  Abdominal x-ray 07/24/2024. CT abdomen and pelvis 07/23/2024. FINDINGS: Dilated central small bowel loops measuring up to 4.3 cm are unchanged. Air and contrast are seen throughout nondilated colon to the level the rectum. Enteric tube tip is in the mid body of the stomach. Phleboliths are present. Vascular stent is seen in the right lower extremity. No suspicious calcifications are identified. Severe degenerative changes affect the spine. IMPRESSION: Unchanged dilated central small bowel loops concerning for small bowel obstruction. Electronically Signed   By: Greig Pique M.D.   On: 07/24/2024 17:16   ECHOCARDIOGRAM COMPLETE Result Date: 07/24/2024    ECHOCARDIOGRAM REPORT   Patient Name:   Shane [name] Date of Exam: 07/24/2024 Medical Rec #:  [id number]        Height:       72.0 in Accession #:    [id number]       Weight:       229.3 lb Date of Birth:  03-09-1941        BSA:          2.258 m Patient Age:    83 years         BP:           109/52 mmHg Patient Gender: M                HR:           60 bpm. Exam Location:  Inpatient Procedure: 2D Echo, Cardiac Doppler, Color Doppler and Intracardiac            Opacification Agent (Both Spectral and Color Flow Doppler were            utilized during procedure). Indications:    CHF-Acute Systolic I50.21  History:        Patient has prior  history of Echocardiogram examinations, most                 recent 04/11/2024. CHF, CAD and Previous Myocardial Infarction,                 Prior CABG, PAD and CKD, Stage 3, Arrythmias:Atrial Fibrillation                 and RBBB, Signs/Symptoms:Hypotension, Shortness of Breath and                 Syncope; Risk Factors:Hypertension, Sleep Apnea and Diabetes.                 Aortic Valve: 29 mm a native pulmonic valve is present in the                 aortic position. Procedure Date: 12/09/23.  Sonographer:    Thea Norlander RCS Referring Phys: [id number] ANASTASSIA DOUTOVA IMPRESSIONS  1. Left ventricular ejection fraction, by estimation, is 30 to 35%. The left ventricle has moderately decreased function. Left ventricular endocardial border not  optimally defined to evaluate regional wall motion. The left ventricular internal cavity size was mildly dilated. There is mild concentric left ventricular hypertrophy. Left ventricular diastolic parameters are consistent with Grade I diastolic dysfunction (impaired relaxation).  2. Right ventricular systolic function is moderately reduced. The right ventricular size is mildly enlarged.  3. Left atrial size was moderately dilated.  4. Right atrial size was mildly dilated.  5. The mitral valve is normal in structure. Mild to moderate mitral valve regurgitation. No evidence of mitral stenosis.  6. The aortic valve is normal in structure. Aortic valve regurgitation is not visualized. No aortic stenosis is present. There is a 29 mm a native pulmonic valve present in the aortic position. Procedure Date: 12/09/23.  7. Aortic dilatation noted. There is borderline dilatation of the ascending aorta, measuring 38 mm.  8. The inferior vena cava is dilated in size with <50% respiratory variability, suggesting right atrial pressure of 15 mmHg. FINDINGS  Left Ventricle: Left ventricular ejection fraction, by estimation, is 30 to 35%. The left ventricle has moderately decreased function. Left  ventricular endocardial border not optimally defined to evaluate regional wall motion. Definity  contrast agent was given IV to delineate the left ventricular endocardial borders. The left ventricular internal cavity size was mildly dilated. There is mild concentric left ventricular hypertrophy. Left ventricular diastolic parameters are consistent with Grade I diastolic dysfunction (impaired relaxation). Right Ventricle: The right ventricular size is mildly enlarged. No increase in right ventricular wall thickness. Right ventricular systolic function is moderately reduced. Left Atrium: Left atrial size was moderately dilated. Right Atrium: Right atrial size was mildly dilated. Pericardium: There is no evidence of pericardial effusion. Mitral Valve: The mitral valve is normal in structure. Mild mitral annular calcification. Mild to moderate mitral valve regurgitation. No evidence of mitral valve stenosis. Tricuspid Valve: The tricuspid valve is normal in structure. Tricuspid valve regurgitation is trivial. No evidence of tricuspid stenosis. Aortic Valve: The aortic valve is normal in structure. Aortic valve regurgitation is not visualized. No aortic stenosis is present. Aortic valve peak gradient measures 10.0 mmHg. There is a 29 mm a native pulmonic valve present in the aortic position. Procedure Date: 12/09/23. Pulmonic Valve: The pulmonic valve was normal in structure. Pulmonic valve regurgitation is not visualized. No evidence of pulmonic stenosis. Aorta: Aortic dilatation noted. There is borderline dilatation of the ascending aorta, measuring 38 mm. Venous: The inferior vena cava is dilated in size with less than 50% respiratory variability, suggesting right atrial pressure of 15 mmHg. IAS/Shunts: No atrial level shunt detected by color flow Doppler.  LEFT VENTRICLE PLAX 2D LVIDd:         5.60 cm   Diastology LVIDs:         4.50 cm   LV e' medial:    5.33 cm/s LV PW:         1.40 cm   LV E/e' medial:  16.2 LV IVS:         1.10 cm   LV e' lateral:   5.68 cm/s LVOT diam:     2.30 cm   LV E/e' lateral: 15.2 LV SV:         86 LV SV Index:   38 LVOT Area:     4.15 cm  RIGHT VENTRICLE            IVC RV S prime:     6.16 cm/s  IVC diam: 2.30 cm TAPSE (M-mode): 1.2 cm LEFT ATRIUM  Index        RIGHT ATRIUM           Index LA diam:      4.70 cm 2.08 cm/m   RA Area:     23.40 cm LA Vol (A2C): 62.5 ml 27.68 ml/m  RA Volume:   69.60 ml  30.82 ml/m LA Vol (A4C): 62.7 ml 27.77 ml/m  AORTIC VALVE AV Area (Vmax): 2.89 cm AV Vmax:        158.00 cm/s AV Peak Grad:   10.0 mmHg LVOT Vmax:      110.00 cm/s LVOT Vmean:     74.400 cm/s LVOT VTI:       0.208 m  AORTA Ao Root diam: 3.10 cm Ao Asc diam:  3.80 cm MITRAL VALVE MV Area (PHT): 3.08 cm    SHUNTS MV Decel Time: 246 msec    Systemic VTI:  0.21 m MV E velocity: 86.20 cm/s  Systemic Diam: 2.30 cm MV A velocity: 97.90 cm/s MV E/A ratio:  0.88 Toribio Fuel MD Electronically signed by Toribio Fuel MD Signature Date/Time: 07/24/2024/12:21:20 PM    Final    DG Abdomen 1 View Result Date: 07/24/2024 CLINICAL DATA:  NG tube placement. EXAM: ABDOMEN - 1 VIEW COMPARISON:  01/06/2018 FINDINGS: NG tube tip is in the stomach with proximal side port below the GE junction. Visualized upper abdomen demonstrates nonspecific bowel gas pattern. IMPRESSION: NG tube tip is in the stomach. Electronically Signed   By: Camellia Candle M.D.   On: 07/24/2024 05:23   CT Angio Chest PE W and/or Wo Contrast Result Date: 07/23/2024 CLINICAL DATA:  Chest pain and shortness of breath. Concern for pulmonary disease. EXAM: CT ANGIOGRAPHY CHEST WITH CONTRAST TECHNIQUE: Multidetector CT imaging of the chest was performed using the standard protocol during bolus administration of intravenous contrast. Multiplanar CT image reconstructions and MIPs were obtained to evaluate the vascular anatomy. RADIATION DOSE REDUCTION: This exam was performed according to the departmental dose-optimization program which  includes automated exposure control, adjustment of the mA and/or kV according to patient size and/or use of iterative reconstruction technique. CONTRAST:  75mL OMNIPAQUE  IOHEXOL  350 MG/ML SOLN COMPARISON:  Chest CT dated 05/31/2024. FINDINGS: Evaluation of this exam is limited due to respiratory motion. Cardiovascular: There is mild cardiomegaly. No pericardial effusion. There is coronary vascular calcification. Aortic valve repair. Mild atherosclerotic calcification of the thoracic aorta. No aneurysmal dilatation. Evaluation of the pulmonary arteries is limited due to respiratory motion as well as streak artifact caused by patient's arms. No porta artery embolus identified. Mediastinum/Nodes: Mildly enlarged right hilar lymph nodes measure 11 mm. The esophagus is grossly unremarkable no mediastinal fluid collection. Lungs/Pleura: Diffuse interstitial coarsening and fibrosis in keeping with interstitial lung disease. Small left pleural effusion. No focal consolidation or pneumothorax. The central airways are patent. Upper Abdomen: No acute abnormality. Musculoskeletal: Osteopenia with degenerative changes. Median sternotomy wires. No acute osseous pathology. Review of the MIP images confirms the above findings. IMPRESSION: 1. No CT evidence of pulmonary artery embolus. 2. Interstitial lung disease. 3. Small left pleural effusion. 4.  Aortic Atherosclerosis (ICD10-I70.0). Electronically Signed   By: Vanetta Chou M.D.   On: 07/23/2024 19:31   CT ABDOMEN PELVIS W CONTRAST Result Date: 07/23/2024 CLINICAL DATA:  Nausea and vomiting EXAM: CT ABDOMEN AND PELVIS WITH CONTRAST TECHNIQUE: Multidetector CT imaging of the abdomen and pelvis was performed using the standard protocol following bolus administration of intravenous contrast. RADIATION DOSE REDUCTION: This exam was performed according to the  departmental dose-optimization program which includes automated exposure control, adjustment of the mA and/or kV  according to patient size and/or use of iterative reconstruction technique. CONTRAST:  75mL OMNIPAQUE  IOHEXOL  350 MG/ML SOLN COMPARISON:  CT abdomen and pelvis 12/30/2023. FINDINGS: Lower chest: Chronic interstitial changes are again noted in the lung bases. The heart is enlarged. Hepatobiliary: No focal liver abnormality is seen. No gallstones, gallbladder wall thickening, or biliary dilatation. Pancreas: Unremarkable. No pancreatic ductal dilatation or surrounding inflammatory changes. Spleen: Normal in size without focal abnormality. Adrenals/Urinary Tract: There is no hydronephrosis or perinephric stranding. No perinephric fluid collection identified. Knee adrenal glands and bladder are within normal limits. Stomach/Bowel: There are mildly dilated central small bowel loops. No wall thickening or inflammation. No transition point visualized. The colon is nondilated. There is diffuse colonic diverticulosis. The appendix is normal. Stomach is within normal limits. Vascular/Lymphatic: Aortic atherosclerosis. No enlarged abdominal or pelvic lymph nodes. Reproductive: Prostate is unremarkable. Other: There is no free air. There is trace free fluid in the left upper quadrant previously identified right retroperitoneal hematoma has significantly decreased in size. Enhancing low-attenuation areas seen in the right retroperitoneum measuring 9.1 x 2.7 x 5.3 cm. There is no focal abdominal wall hernia. Musculoskeletal: Degenerative changes affect the spine the hips. There are laminectomy defects at L3-L4. IMPRESSION: 1. Mildly dilated central small bowel loops without transition point. Findings may represent ileus or partial small bowel obstruction. 2. Trace free fluid in the left upper quadrant. 3. Enhancing low-attenuation collection in the right retroperitoneum is most likely related to resolving hematoma. Infection not excluded. 4. Colonic diverticulosis. Aortic Atherosclerosis (ICD10-I70.0). Electronically Signed   By:  Greig Pique M.D.   On: 07/23/2024 19:30   DG Chest Portable 1 View Result Date: 07/23/2024 CLINICAL DATA:  Short of breath, vomiting EXAM: PORTABLE CHEST 1 VIEW COMPARISON:  04/11/2024 FINDINGS: Two frontal views of the chest demonstrate stable postsurgical changes from CABG and aortic valve replacement. Cardiac silhouette remains enlarged. Chronic background pulmonary scarring and fibrosis, with increased pulmonary vascular congestion and patchy bibasilar consolidation consistent with superimposed edema. Small bilateral effusions. No pneumothorax. IMPRESSION: 1. Congestive heart failure superimposed upon chronic pulmonary fibrosis and scarring. Electronically Signed   By: Ozell Daring M.D.   On: 07/23/2024 18:24     TODAY-DAY OF DISCHARGE:  Subjective:   Lynwood Sic today has no headache,no chest abdominal pain,no new weakness tingling or numbness, feels much better wants to go home today.   Objective:   Blood pressure (!) 148/71, pulse 66, temperature (!) 96.4 F (35.8 C), temperature source Axillary, resp. rate 14, height 6' (1.829 m), weight 104 kg, SpO2 100%.  Intake/Output Summary (Last 24 hours) at 07/26/2024 0947 Last data filed at 07/26/2024 0200 Gross per 24 hour  Intake 5299.79 ml  Output 900 ml  Net 4399.79 ml   Filed Weights   07/24/24 0000  Weight: 104 kg    Exam: Awake Alert, Oriented *3, No new F.N deficits, Normal affect [place].AT,PERRAL Supple Neck,No JVD, No cervical lymphadenopathy appriciated.  Symmetrical Chest wall movement, Good air movement bilaterally, CTAB RRR,No Gallops,Rubs or new Murmurs, No Parasternal Heave +ve B.Sounds, Abd Soft, Non tender, No organomegaly appriciated, No rebound -guarding or rigidity. No Cyanosis, Clubbing or edema, No new Rash or bruise   PERTINENT RADIOLOGIC STUDIES: DG Abd Portable 1V-Small Bowel Obstruction Protocol-initial, 8 hr delay Result Date: 07/24/2024 CLINICAL DATA:  Small-bowel obstruction EXAM: PORTABLE ABDOMEN  - 1 VIEW COMPARISON:  Abdominal x-ray 07/24/2024. CT abdomen and pelvis 07/23/2024.  FINDINGS: Dilated central small bowel loops measuring up to 4.3 cm are unchanged. Air and contrast are seen throughout nondilated colon to the level the rectum. Enteric tube tip is in the mid body of the stomach. Phleboliths are present. Vascular stent is seen in the right lower extremity. No suspicious calcifications are identified. Severe degenerative changes affect the spine. IMPRESSION: Unchanged dilated central small bowel loops concerning for small bowel obstruction. Electronically Signed   By: Greig Pique M.D.   On: 07/24/2024 17:16   ECHOCARDIOGRAM COMPLETE Result Date: 07/24/2024    ECHOCARDIOGRAM REPORT   Patient Name:   Shane [name] Date of Exam: 07/24/2024 Medical Rec #:  [id number]        Height:       72.0 in Accession #:    [id number]       Weight:       229.3 lb Date of Birth:  Sep 01, 1941        BSA:          2.258 m Patient Age:    83 years         BP:           109/52 mmHg Patient Gender: M                HR:           60 bpm. Exam Location:  Inpatient Procedure: 2D Echo, Cardiac Doppler, Color Doppler and Intracardiac            Opacification Agent (Both Spectral and Color Flow Doppler were            utilized during procedure). Indications:    CHF-Acute Systolic I50.21  History:        Patient has prior history of Echocardiogram examinations, most                 recent 04/11/2024. CHF, CAD and Previous Myocardial Infarction,                 Prior CABG, PAD and CKD, Stage 3, Arrythmias:Atrial Fibrillation                 and RBBB, Signs/Symptoms:Hypotension, Shortness of Breath and                 Syncope; Risk Factors:Hypertension, Sleep Apnea and Diabetes.                 Aortic Valve: 29 mm a native pulmonic valve is present in the                 aortic position. Procedure Date: 12/09/23.  Sonographer:    Thea Norlander RCS Referring Phys: [id number] ANASTASSIA DOUTOVA IMPRESSIONS  1. Left ventricular ejection  fraction, by estimation, is 30 to 35%. The left ventricle has moderately decreased function. Left ventricular endocardial border not optimally defined to evaluate regional wall motion. The left ventricular internal cavity size was mildly dilated. There is mild concentric left ventricular hypertrophy. Left ventricular diastolic parameters are consistent with Grade I diastolic dysfunction (impaired relaxation).  2. Right ventricular systolic function is moderately reduced. The right ventricular size is mildly enlarged.  3. Left atrial size was moderately dilated.  4. Right atrial size was mildly dilated.  5. The mitral valve is normal in structure. Mild to moderate mitral valve regurgitation. No evidence of mitral stenosis.  6. The aortic valve is normal in structure. Aortic valve regurgitation is not visualized. No aortic stenosis is present. There is  a 29 mm a native pulmonic valve present in the aortic position. Procedure Date: 12/09/23.  7. Aortic dilatation noted. There is borderline dilatation of the ascending aorta, measuring 38 mm.  8. The inferior vena cava is dilated in size with <50% respiratory variability, suggesting right atrial pressure of 15 mmHg. FINDINGS  Left Ventricle: Left ventricular ejection fraction, by estimation, is 30 to 35%. The left ventricle has moderately decreased function. Left ventricular endocardial border not optimally defined to evaluate regional wall motion. Definity  contrast agent was given IV to delineate the left ventricular endocardial borders. The left ventricular internal cavity size was mildly dilated. There is mild concentric left ventricular hypertrophy. Left ventricular diastolic parameters are consistent with Grade I diastolic dysfunction (impaired relaxation). Right Ventricle: The right ventricular size is mildly enlarged. No increase in right ventricular wall thickness. Right ventricular systolic function is moderately reduced. Left Atrium: Left atrial size was  moderately dilated. Right Atrium: Right atrial size was mildly dilated. Pericardium: There is no evidence of pericardial effusion. Mitral Valve: The mitral valve is normal in structure. Mild mitral annular calcification. Mild to moderate mitral valve regurgitation. No evidence of mitral valve stenosis. Tricuspid Valve: The tricuspid valve is normal in structure. Tricuspid valve regurgitation is trivial. No evidence of tricuspid stenosis. Aortic Valve: The aortic valve is normal in structure. Aortic valve regurgitation is not visualized. No aortic stenosis is present. Aortic valve peak gradient measures 10.0 mmHg. There is a 29 mm a native pulmonic valve present in the aortic position. Procedure Date: 12/09/23. Pulmonic Valve: The pulmonic valve was normal in structure. Pulmonic valve regurgitation is not visualized. No evidence of pulmonic stenosis. Aorta: Aortic dilatation noted. There is borderline dilatation of the ascending aorta, measuring 38 mm. Venous: The inferior vena cava is dilated in size with less than 50% respiratory variability, suggesting right atrial pressure of 15 mmHg. IAS/Shunts: No atrial level shunt detected by color flow Doppler.  LEFT VENTRICLE PLAX 2D LVIDd:         5.60 cm   Diastology LVIDs:         4.50 cm   LV e' medial:    5.33 cm/s LV PW:         1.40 cm   LV E/e' medial:  16.2 LV IVS:        1.10 cm   LV e' lateral:   5.68 cm/s LVOT diam:     2.30 cm   LV E/e' lateral: 15.2 LV SV:         86 LV SV Index:   38 LVOT Area:     4.15 cm  RIGHT VENTRICLE            IVC RV S prime:     6.16 cm/s  IVC diam: 2.30 cm TAPSE (M-mode): 1.2 cm LEFT ATRIUM           Index        RIGHT ATRIUM           Index LA diam:      4.70 cm 2.08 cm/m   RA Area:     23.40 cm LA Vol (A2C): 62.5 ml 27.68 ml/m  RA Volume:   69.60 ml  30.82 ml/m LA Vol (A4C): 62.7 ml 27.77 ml/m  AORTIC VALVE AV Area (Vmax): 2.89 cm AV Vmax:        158.00 cm/s AV Peak Grad:   10.0 mmHg LVOT Vmax:      110.00 cm/s LVOT Vmean:  74.400 cm/s LVOT VTI:       0.208 m  AORTA Ao Root diam: 3.10 cm Ao Asc diam:  3.80 cm MITRAL VALVE MV Area (PHT): 3.08 cm    SHUNTS MV Decel Time: 246 msec    Systemic VTI:  0.21 m MV E velocity: 86.20 cm/s  Systemic Diam: 2.30 cm MV A velocity: 97.90 cm/s MV E/A ratio:  0.88 Toribio Fuel MD Electronically signed by Toribio Fuel MD Signature Date/Time: 07/24/2024/12:21:20 PM    Final      PERTINENT LAB RESULTS: CBC: Recent Labs    07/24/24 1036 07/26/24 0621  WBC 12.0* 12.7*  HGB 11.9* 11.1*  HCT 36.8* 34.6*  PLT 170 152   CMET CMP     Component Value Date/Time   NA 141 07/26/2024 0621   NA 137 06/30/2020 1545   K 4.6 07/26/2024 0621   CL 111 07/26/2024 0621   CO2 18 (L) 07/26/2024 0621   GLUCOSE 141 (H) 07/26/2024 0621   BUN 30 (H) 07/26/2024 0621   BUN 26 06/30/2020 1545   CREATININE 1.12 07/26/2024 0621   CREATININE 1.35 (H) 11/07/2016 1044   CALCIUM  8.1 (L) 07/26/2024 0621   PROT 6.0 (L) 07/25/2024 0442   ALBUMIN  2.6 (L) 07/25/2024 0442   AST 48 (H) 07/25/2024 0442   ALT 21 07/25/2024 0442   ALKPHOS 62 07/25/2024 0442   BILITOT 0.7 07/25/2024 0442   GFR 41.02 (L) 02/09/2023 1440   GFRNONAA >60 07/26/2024 0621    GFR Estimated Creatinine Clearance: 62.3 mL/min (by C-G formula based on SCr of 1.12 mg/dL). Recent Labs    07/23/24 1731  LIPASE 33   Recent Labs    07/23/24 2353  CKTOTAL 307   Invalid input(s): POCBNP No results for input(s): DDIMER in the last 72 hours. No results for input(s): HGBA1C in the last 72 hours. No results for input(s): CHOL, HDL, LDLCALC, TRIG, CHOLHDL, LDLDIRECT in the last 72 hours. Recent Labs    07/23/24 1906  TSH 6.678*   No results for input(s): VITAMINB12, FOLATE, FERRITIN, TIBC, IRON , RETICCTPCT in the last 72 hours. Coags: No results for input(s): INR in the last 72 hours.  Invalid input(s): PT Microbiology: Recent Results (from the past 240 hours)  Blood culture  (routine x 2)     Status: None (Preliminary result)   Collection Time: 07/23/24  5:06 PM   Specimen: BLOOD  Result Value Ref Range Status   Specimen Description BLOOD SITE NOT SPECIFIED  Final   Special Requests   Final    BOTTLES DRAWN AEROBIC AND ANAEROBIC Blood Culture results may not be optimal due to an inadequate volume of blood received in culture bottles   Culture   Final    NO GROWTH 3 DAYS Performed at [facility], [address], [place], [place] [address]    Report Status PENDING  Incomplete  Resp panel by RT-PCR (RSV, Flu A&B, Covid)     Status: None   Collection Time: 07/23/24  5:07 PM   Specimen: Nasal Swab  Result Value Ref Range Status   SARS Coronavirus 2 by RT PCR NEGATIVE NEGATIVE Final   Influenza A by PCR NEGATIVE NEGATIVE Final   Influenza B by PCR NEGATIVE NEGATIVE Final    Comment: (NOTE) The Xpert Xpress SARS-CoV-2/FLU/RSV plus assay is intended as an aid in the diagnosis of influenza from Nasopharyngeal swab specimens and should not be used as a sole basis for treatment. Nasal washings and aspirates are unacceptable for  Xpert Xpress SARS-CoV-2/FLU/RSV testing.  Fact Sheet for Patients: BloggerCourse.com  Fact Sheet for Healthcare Providers: SeriousBroker.it  This test is not yet approved or cleared by the [place]  FDA and has been authorized for detection and/or diagnosis of SARS-CoV-2 by FDA under an Emergency Use Authorization (EUA). This EUA will remain in effect (meaning this test can be used) for the duration of the COVID-19 declaration under Section 564(b)(1) of the Act, 21 U.S.C. section 360bbb-3(b)(1), unless the authorization is terminated or revoked.     Resp Syncytial Virus by PCR NEGATIVE NEGATIVE Final    Comment: (NOTE) Fact Sheet for Patients: BloggerCourse.com  Fact Sheet for Healthcare  Providers: SeriousBroker.it  This test is not yet approved or cleared by the [place]  FDA and has been authorized for detection and/or diagnosis of SARS-CoV-2 by FDA under an Emergency Use Authorization (EUA). This EUA will remain in effect (meaning this test can be used) for the duration of the COVID-19 declaration under Section 564(b)(1) of the Act, 21 U.S.C. section 360bbb-3(b)(1), unless the authorization is terminated or revoked.  Performed at [facility], [address], [place], [place] [address]   Blood culture (routine x 2)     Status: None (Preliminary result)   Collection Time: 07/23/24  7:38 PM   Specimen: BLOOD LEFT ARM  Result Value Ref Range Status   Specimen Description BLOOD LEFT ARM  Final   Special Requests   Final    BOTTLES DRAWN AEROBIC AND ANAEROBIC Blood Culture results may not be optimal due to an inadequate volume of blood received in culture bottles   Culture   Final    NO GROWTH 3 DAYS Performed at [facility], [address], [place], [place] [address]    Report Status PENDING  Incomplete  Respiratory (~20 pathogens) panel by PCR     Status: None   Collection Time: 07/24/24  7:32 AM   Specimen: Nasopharyngeal Swab; Respiratory  Result Value Ref Range Status   Adenovirus NOT DETECTED NOT DETECTED Final   Coronavirus 229E NOT DETECTED NOT DETECTED Final    Comment: (NOTE) The Coronavirus on the Respiratory Panel, DOES NOT test for the novel  Coronavirus (2019 nCoV)    Coronavirus HKU1 NOT DETECTED NOT DETECTED Final   Coronavirus NL63 NOT DETECTED NOT DETECTED Final   Coronavirus OC43 NOT DETECTED NOT DETECTED Final   Metapneumovirus NOT DETECTED NOT DETECTED Final   Rhinovirus / Enterovirus NOT DETECTED NOT DETECTED Final   Influenza A NOT DETECTED NOT DETECTED Final   Influenza B NOT DETECTED NOT DETECTED Final   Parainfluenza Virus 1 NOT DETECTED NOT DETECTED Final   Parainfluenza Virus 2 NOT  DETECTED NOT DETECTED Final   Parainfluenza Virus 3 NOT DETECTED NOT DETECTED Final   Parainfluenza Virus 4 NOT DETECTED NOT DETECTED Final   Respiratory Syncytial Virus NOT DETECTED NOT DETECTED Final   Bordetella pertussis NOT DETECTED NOT DETECTED Final   Bordetella Parapertussis NOT DETECTED NOT DETECTED Final   Chlamydophila pneumoniae NOT DETECTED NOT DETECTED Final   Mycoplasma pneumoniae NOT DETECTED NOT DETECTED Final    Comment: Performed at [facility], [address], [place], [place] [address]    FURTHER DISCHARGE INSTRUCTIONS:  Get Medicines reviewed and adjusted: Please take all your medications with you for your next visit with your Primary MD  Laboratory/radiological data: Please request your Primary MD to go over all hospital tests and procedure/radiological results at the follow up, please ask your Primary MD  to get all Hospital records sent to his/her office.  In some cases, they will be blood work, cultures and biopsy results pending at the time of your discharge. Please request that your primary care M.D. goes through all the records of your hospital data and follows up on these results.  Also Note the following: If you experience worsening of your admission symptoms, develop shortness of breath, life threatening emergency, suicidal or homicidal thoughts you must seek medical attention immediately by calling 911 or calling your MD immediately  if symptoms less severe.  You must read complete instructions/literature along with all the possible adverse reactions/side effects for all the Medicines you take and that have been prescribed to you. Take any new Medicines after you have completely understood and accpet all the possible adverse reactions/side effects.   Do not drive when taking Pain medications or sleeping medications (Benzodaizepines)  Do not take more than prescribed Pain, Sleep and Anxiety Medications. It is not advisable to combine anxiety,sleep and  pain medications without talking with your primary care practitioner  Special Instructions: If you have smoked or chewed Tobacco  in the last 2 yrs please stop smoking, stop any regular Alcohol  and or any Recreational drug use.  Wear Seat belts while driving.  Please note: You were cared for by a hospitalist during your hospital stay. Once you are discharged, your primary care physician will handle any further medical issues. Please note that NO REFILLS for any discharge medications will be authorized once you are discharged, as it is imperative that you return to your primary care physician (or establish a relationship with a primary care physician if you do not have one) for your post hospital discharge needs so that they can reassess your need for medications and monitor your lab values.  Total Time spent coordinating discharge including counseling, education and face to face time equals greater than 30 minutes.  SignedBETHA Donalda Applebaum 07/26/2024 9:47 AM

## 2024-07-26 NOTE — Care Management Important Message (Signed)
 Important Message  Patient Details  Name: Oseas Detty MRN: [id number] Date of Birth: 1941-03-11   Important Message Given:  Yes - Medicare IM   Patient left prior to IM delivery will send a copy to the patient  home address.    Meisha Salone 07/26/2024, 1:31 PM

## 2024-07-26 NOTE — Progress Notes (Signed)
 Progress Note     Subjective: Patient denies abdominal pain. Reports that he is tolerating regular diet. Had a bowel movement yesterday that was loose. Unsure if there was blood present in stool. Reports flatulence. Denies nausea and vomiting.  Was able to mobilize yesterday.  ROS  All negative with the exception of above.  Objective: Vital signs in last 24 hours: Temp:  [96.4 F (35.8 C)-98.7 F (37.1 C)] 96.4 F (35.8 C) (08/22 0743) Pulse Rate:  [62-75] 66 (08/22 0743) Resp:  [14-20] 14 (08/22 0743) BP: (98-152)/(71-81) 148/71 (08/22 0743) SpO2:  [95 %-100 %] 100 % (08/22 0743) Last BM Date : 07/25/24  Intake/Output from previous day: 08/21 0701 - 08/22 0700 In: 5299.8 [I.V.:5149.8; IV Piggyback:150] Out: 900 [Urine:900] Intake/Output this shift: No intake/output data recorded.  PE: General: Pleasant male who is laying in bed in NAD. Lungs: Respiratory effort nonlabored. Abd: Soft with some distention. No tenderness to palpation. +bowel sounds. No rebound tenderness or guarding.  Psych: A&Ox3 with an appropriate affect.    Lab Results:  Recent Labs    07/24/24 1036 07/26/24 0621  WBC 12.0* 12.7*  HGB 11.9* 11.1*  HCT 36.8* 34.6*  PLT 170 152   BMET Recent Labs    07/25/24 0442 07/26/24 0621  NA 137 141  K 4.1 4.6  CL 104 111  CO2 23 18*  GLUCOSE 150* 141*  BUN 44* 30*  CREATININE 1.61* 1.12  CALCIUM  8.2* 8.1*   PT/INR No results for input(s): LABPROT, INR in the last 72 hours. CMP     Component Value Date/Time   NA 141 07/26/2024 0621   NA 137 06/30/2020 1545   K 4.6 07/26/2024 0621   CL 111 07/26/2024 0621   CO2 18 (L) 07/26/2024 0621   GLUCOSE 141 (H) 07/26/2024 0621   BUN 30 (H) 07/26/2024 0621   BUN 26 06/30/2020 1545   CREATININE 1.12 07/26/2024 0621   CREATININE 1.35 (H) 11/07/2016 1044   CALCIUM  8.1 (L) 07/26/2024 0621   PROT 6.0 (L) 07/25/2024 0442   ALBUMIN  2.6 (L) 07/25/2024 0442   AST 48 (H) 07/25/2024 0442   ALT 21  07/25/2024 0442   ALKPHOS 62 07/25/2024 0442   BILITOT 0.7 07/25/2024 0442   GFRNONAA >60 07/26/2024 0621   GFRAA 53 (L) 07/17/2020 1300   Lipase     Component Value Date/Time   LIPASE 33 07/23/2024 1731       Studies/Results: DG Abd Portable 1V-Small Bowel Obstruction Protocol-initial, 8 hr delay Result Date: 07/24/2024 CLINICAL DATA:  Small-bowel obstruction EXAM: PORTABLE ABDOMEN - 1 VIEW COMPARISON:  Abdominal x-ray 07/24/2024. CT abdomen and pelvis 07/23/2024. FINDINGS: Dilated central small bowel loops measuring up to 4.3 cm are unchanged. Air and contrast are seen throughout nondilated colon to the level the rectum. Enteric tube tip is in the mid body of the stomach. Phleboliths are present. Vascular stent is seen in the right lower extremity. No suspicious calcifications are identified. Severe degenerative changes affect the spine. IMPRESSION: Unchanged dilated central small bowel loops concerning for small bowel obstruction. Electronically Signed   By: Greig Pique M.D.   On: 07/24/2024 17:16   ECHOCARDIOGRAM COMPLETE Result Date: 07/24/2024    ECHOCARDIOGRAM REPORT   Patient Name:   Shane [name] Date of Exam: 07/24/2024 Medical Rec #:  [id number]        Height:       72.0 in Accession #:    [id number]       Weight:  229.3 lb Date of Birth:  [birth year]        BSA:          2.258 m Patient Age:    83 years         BP:           109/52 mmHg Patient Gender: M                HR:           60 bpm. Exam Location:  Inpatient Procedure: 2D Echo, Cardiac Doppler, Color Doppler and Intracardiac            Opacification Agent (Both Spectral and Color Flow Doppler were            utilized during procedure). Indications:    CHF-Acute Systolic I50.21  History:        Patient has prior history of Echocardiogram examinations, most                 recent 04/11/2024. CHF, CAD and Previous Myocardial Infarction,                 Prior CABG, PAD and CKD, Stage 3, Arrythmias:Atrial Fibrillation                  and RBBB, Signs/Symptoms:Hypotension, Shortness of Breath and                 Syncope; Risk Factors:Hypertension, Sleep Apnea and Diabetes.                 Aortic Valve: 29 mm a native pulmonic valve is present in the                 aortic position. Procedure Date: 12/09/23.  Sonographer:    Thea Norlander RCS Referring Phys: [id number] ANASTASSIA DOUTOVA IMPRESSIONS  1. Left ventricular ejection fraction, by estimation, is 30 to 35%. The left ventricle has moderately decreased function. Left ventricular endocardial border not optimally defined to evaluate regional wall motion. The left ventricular internal cavity size was mildly dilated. There is mild concentric left ventricular hypertrophy. Left ventricular diastolic parameters are consistent with Grade I diastolic dysfunction (impaired relaxation).  2. Right ventricular systolic function is moderately reduced. The right ventricular size is mildly enlarged.  3. Left atrial size was moderately dilated.  4. Right atrial size was mildly dilated.  5. The mitral valve is normal in structure. Mild to moderate mitral valve regurgitation. No evidence of mitral stenosis.  6. The aortic valve is normal in structure. Aortic valve regurgitation is not visualized. No aortic stenosis is present. There is a 29 mm a native pulmonic valve present in the aortic position. Procedure Date: 12/09/23.  7. Aortic dilatation noted. There is borderline dilatation of the ascending aorta, measuring 38 mm.  8. The inferior vena cava is dilated in size with <50% respiratory variability, suggesting right atrial pressure of 15 mmHg. FINDINGS  Left Ventricle: Left ventricular ejection fraction, by estimation, is 30 to 35%. The left ventricle has moderately decreased function. Left ventricular endocardial border not optimally defined to evaluate regional wall motion. Definity  contrast agent was given IV to delineate the left ventricular endocardial borders. The left ventricular internal cavity  size was mildly dilated. There is mild concentric left ventricular hypertrophy. Left ventricular diastolic parameters are consistent with Grade I diastolic dysfunction (impaired relaxation). Right Ventricle: The right ventricular size is mildly enlarged. No increase in right ventricular wall thickness. Right ventricular systolic function is moderately reduced.  Left Atrium: Left atrial size was moderately dilated. Right Atrium: Right atrial size was mildly dilated. Pericardium: There is no evidence of pericardial effusion. Mitral Valve: The mitral valve is normal in structure. Mild mitral annular calcification. Mild to moderate mitral valve regurgitation. No evidence of mitral valve stenosis. Tricuspid Valve: The tricuspid valve is normal in structure. Tricuspid valve regurgitation is trivial. No evidence of tricuspid stenosis. Aortic Valve: The aortic valve is normal in structure. Aortic valve regurgitation is not visualized. No aortic stenosis is present. Aortic valve peak gradient measures 10.0 mmHg. There is a 29 mm a native pulmonic valve present in the aortic position. Procedure Date: 12/09/23. Pulmonic Valve: The pulmonic valve was normal in structure. Pulmonic valve regurgitation is not visualized. No evidence of pulmonic stenosis. Aorta: Aortic dilatation noted. There is borderline dilatation of the ascending aorta, measuring 38 mm. Venous: The inferior vena cava is dilated in size with less than 50% respiratory variability, suggesting right atrial pressure of 15 mmHg. IAS/Shunts: No atrial level shunt detected by color flow Doppler.  LEFT VENTRICLE PLAX 2D LVIDd:         5.60 cm   Diastology LVIDs:         4.50 cm   LV e' medial:    5.33 cm/s LV PW:         1.40 cm   LV E/e' medial:  16.2 LV IVS:        1.10 cm   LV e' lateral:   5.68 cm/s LVOT diam:     2.30 cm   LV E/e' lateral: 15.2 LV SV:         86 LV SV Index:   38 LVOT Area:     4.15 cm  RIGHT VENTRICLE            IVC RV S prime:     6.16 cm/s  IVC  diam: 2.30 cm TAPSE (M-mode): 1.2 cm LEFT ATRIUM           Index        RIGHT ATRIUM           Index LA diam:      4.70 cm 2.08 cm/m   RA Area:     23.40 cm LA Vol (A2C): 62.5 ml 27.68 ml/m  RA Volume:   69.60 ml  30.82 ml/m LA Vol (A4C): 62.7 ml 27.77 ml/m  AORTIC VALVE AV Area (Vmax): 2.89 cm AV Vmax:        158.00 cm/s AV Peak Grad:   10.0 mmHg LVOT Vmax:      110.00 cm/s LVOT Vmean:     74.400 cm/s LVOT VTI:       0.208 m  AORTA Ao Root diam: 3.10 cm Ao Asc diam:  3.80 cm MITRAL VALVE MV Area (PHT): 3.08 cm    SHUNTS MV Decel Time: 246 msec    Systemic VTI:  0.21 m MV E velocity: 86.20 cm/s  Systemic Diam: 2.30 cm MV A velocity: 97.90 cm/s MV E/A ratio:  0.88 Toribio Fuel MD Electronically signed by Toribio Fuel MD Signature Date/Time: 07/24/2024/12:21:20 PM    Final     Anti-infectives: Anti-infectives (From admission, onward)    Start     Dose/Rate Route Frequency Ordered Stop   07/24/24 1700  vancomycin  (VANCOREADY) IVPB 750 mg/150 mL  Status:  Discontinued        750 mg 150 mL/hr over 60 Minutes Intravenous Every 24 hours 07/24/24 0117 07/24/24 1704   07/24/24 0500  metroNIDAZOLE  (FLAGYL ) IVPB 500 mg  Status:  Discontinued        500 mg 100 mL/hr over 60 Minutes Intravenous Every 12 hours 07/24/24 0013 07/24/24 1704   07/24/24 0500  ceFEPIme  (MAXIPIME ) 2 g in sodium chloride  0.9 % 100 mL IVPB  Status:  Discontinued        2 g 200 mL/hr over 30 Minutes Intravenous Every 12 hours 07/24/24 0116 07/24/24 1704   07/23/24 1715  ceFEPIme  (MAXIPIME ) 2 g in sodium chloride  0.9 % 100 mL IVPB        2 g 200 mL/hr over 30 Minutes Intravenous  Once 07/23/24 1707 07/23/24 1921   07/23/24 1715  metroNIDAZOLE  (FLAGYL ) IVPB 500 mg        500 mg 100 mL/hr over 60 Minutes Intravenous  Once 07/23/24 1707 07/23/24 2046   07/23/24 1715  vancomycin  (VANCOCIN ) IVPB 1000 mg/200 mL premix  Status:  Discontinued        1,000 mg 200 mL/hr over 60 Minutes Intravenous  Once 07/23/24 1707 07/23/24  1709   07/23/24 1715  vancomycin  (VANCOREADY) IVPB 2000 mg/400 mL        2,000 mg 200 mL/hr over 120 Minutes Intravenous  Once 07/23/24 1709 07/23/24 2152        Assessment/Plan SBO vs ileus - CT from 8/19 showed ildus vs SBO. Trace free fluid in luq. - CTA from 8/19 showed no PE - SBO Protocol from 8/20 showed air and contrast seen throughout nondilated colon to the level of the rectum. Unchanged dilated central small bowel loops concerning for small bowel loops concerning for small bowel obstruction.  - Mildly hypertensive. Afebrile. - WBC 12.7; HGB 11.1 - Having bowel movements and flatulence. Tolerating regular diet without nausea or vomiting. No surgical intervention planned at this time. - Continue to mobilize as tolerated. - Stable from general surgery standpoint. Fit for discharge once medically cleared.   FEN: Regular diet; IVF per primary team VTE: SCDs, Heparin  ID: None currently    LOS: 3 days   I reviewed specialist notes, hospitalist notes, last 24 h vitals and pain scores, last 48 h intake and output, last 24 h labs and trends, and last 24 h imaging results.  This care required moderate level of medical decision making.    Marjorie Carlyon Favre, [facility] 07/26/2024, 8:27 AM Please see Amion for pager number during day hours 7:00am-4:30pm

## 2024-07-26 NOTE — Plan of Care (Signed)
Goals Progressing

## 2024-07-26 NOTE — Progress Notes (Signed)
 PHARMACY - ANTICOAGULATION CONSULT NOTE  Pharmacy Consult for heparin  Indication: atrial fibrillation, hx of PE  Allergies  Allergen Reactions   Codeine Hives and Itching   Ofev  [Nintedanib ] Diarrhea    SEVERE DIARRHEA   Pirfenidone  Diarrhea and Other (See Comments)    Esbriet  (Pirfenidone ) causes elevated LFTs. D/C on 06/14/17 and SEVERE DIARRHEA    Amoxicillin Itching    Patient Measurements: Height: 6' (182.9 cm) Weight: 104 kg (229 lb 4.5 oz) (Wt from 06/25/2024) IBW/kg (Calculated) : 77.6 HEPARIN  DW (KG): 99.1  Vital Signs: Temp: 97.3 F (36.3 C) (08/21 2349) Temp Source: Oral (08/21 2349) BP: 121/81 (08/21 2349) Pulse Rate: 68 (08/21 2349)  Labs: Recent Labs    07/23/24 1731 07/23/24 1737 07/23/24 1831 07/23/24 1938 07/23/24 2353 07/24/24 0019 07/24/24 0158 07/24/24 1036 07/24/24 1100 07/24/24 1412 07/24/24 1651 07/25/24 0022 07/25/24 0442 07/25/24 1046 07/25/24 2126 07/25/24 2343  HGB 14.7   < > 15.6  --   --  11.9*  --  11.9*  --   --   --   --   --   --   --   --   HCT 44.8   < > 46.0  --   --  35.0*  --  36.8*  --   --   --   --   --   --   --   --   PLT 245  --   --   --   --   --   --  170  --   --   --   --   --   --   --   --   APTT  --   --   --   --   --   --   --   --    < >  --   --  122*  --  57* >200* 72*  HEPARINUNFRC  --   --   --   --   --   --    < >  --   --   --   --  >1.10*  --  1.00*  --  0.70  CREATININE 1.67*  --  1.70*  --   --   --   --   --   --   --   --   --  1.61*  --   --   --   CKTOTAL  --   --   --   --  307  --   --   --   --   --   --   --   --   --   --   --   TROPONINIHS 18*  --   --    < > 3,304*  --    < > 5,053*  --  5,648* 4,867*  --   --   --   --   --    < > = values in this interval not displayed.    Estimated Creatinine Clearance: 43.4 mL/min (A) (by C-G formula based on SCr of 1.61 mg/dL (H)).  Medications:  -Eliquis  5mg  PO BID (Last dose 8/19 AM)  Assessment: 67 yoM presented to ED with emesis,  hypotension, and SOB. Pharmacy consulted to dose heparin  for afib/hx of PE. 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   -5/25: NM perfusion indicated high probability of PE, CTa this  admission showing no acute PE -CT abdomen: ileus vs partial SBO + resolving hematoma in R. Retroperitoneum    8/22 AM :  PTT 72 sec and HL 0.72 on heparin  1100 units/hr.  PTT is therapeutic.  No bleeding reported.    Goal of Therapy:  Heparin  level 0.3-0.7 units/ml aPTT 66-102 seconds Monitor platelets by anticoagulation protocol: Yes   Plan:  Continue IV heparin  to 1100 units/hr. Monitor with aPTT  until correlates with heparin  level Daily PTT, HL Continue to monitor H&H and platelets Follow up transition back to Eliquis      Levorn Gaskins, RPh Clinical Pharmacist  07/26/2024 12:51 AM   [facility] pharmacy phone numbers are listed on [email or web address]

## 2024-07-26 NOTE — Progress Notes (Signed)
 PHARMACY - ANTICOAGULATION CONSULT NOTE  Pharmacy Consult for heparin  - > back to Eliquis  Indication: atrial fibrillation, hx of PE  Allergies  Allergen Reactions   Codeine Hives and Itching   Ofev  [Nintedanib ] Diarrhea    SEVERE DIARRHEA   Pirfenidone  Diarrhea and Other (See Comments)    Esbriet  (Pirfenidone ) causes elevated LFTs. D/C on 06/14/17 and SEVERE DIARRHEA    Amoxicillin Itching    Patient Measurements: Height: 6' (182.9 cm) Weight: 104 kg (229 lb 4.5 oz) (Wt from 06/25/2024) IBW/kg (Calculated) : 77.6 HEPARIN  DW (KG): 99.1  Vital Signs: Temp: 96.4 F (35.8 C) (08/22 0743) Temp Source: Axillary (08/22 0743) BP: 148/71 (08/22 0743) Pulse Rate: 66 (08/22 0743)  Labs: Recent Labs    07/23/24 1731 07/23/24 1737 07/23/24 1831 07/23/24 1938 07/23/24 2353 07/24/24 0019 07/24/24 0158 07/24/24 1036 07/24/24 1100 07/24/24 1412 07/24/24 1651 07/25/24 0022 07/25/24 0442 07/25/24 1046 07/25/24 2126 07/25/24 2343 07/26/24 0621  HGB 14.7   < > 15.6  --   --  11.9*  --  11.9*  --   --   --   --   --   --   --   --  11.1*  HCT 44.8   < > 46.0  --   --  35.0*  --  36.8*  --   --   --   --   --   --   --   --  34.6*  PLT 245  --   --   --   --   --   --  170  --   --   --   --   --   --   --   --  152  APTT  --   --   --   --   --   --   --   --    < >  --   --    < >  --  57* >200* 72* 65*  HEPARINUNFRC  --   --   --   --   --   --    < >  --   --   --   --    < >  --  1.00*  --  0.70 0.53  CREATININE 1.67*  --  1.70*  --   --   --   --   --   --   --   --   --  1.61*  --   --   --  1.12  CKTOTAL  --   --   --   --  307  --   --   --   --   --   --   --   --   --   --   --   --   TROPONINIHS 18*  --   --    < > 3,304*  --    < > 5,053*  --  5,648* 4,867*  --   --   --   --   --   --    < > = values in this interval not displayed.    Estimated Creatinine Clearance: 62.3 mL/min (by C-G formula based on SCr of 1.12 mg/dL).  Medications:  -Eliquis  5mg  PO BID (Last  dose 8/19 AM)  Assessment: 26 yoM presented to ED with emesis, hypotension, and SOB. Pharmacy consulted to dose heparin  for afib/hx of PE. 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   -5/25: NM perfusion indicated high probability of PE, CTa this admission showing no acute PE -CT abdomen: ileus vs partial SBO + resolving hematoma in R. Retroperitoneum   Heparin  level therapeutic  Goal of Therapy:  Heparin  level 0.3-0.7 units/ml aPTT 66-102 seconds Monitor platelets by anticoagulation protocol: Yes   Plan:  Continue IV heparin  to 1100 units/hr. DC daily PTT Follow up daily heparin  level Continue to monitor H&H and platelets Follow up transition back to Eliquis    Thank you. Olam Monte, PharmD  07/26/2024 8:46 AM   [facility] pharmacy phone numbers are listed on [email or web address]  8:45 am - back to Eliquis  Thank you. Olam Monte, PharmD

## 2024-07-26 NOTE — Progress Notes (Signed)
 PHARMACY - ANTICOAGULATION CONSULT NOTE  Pharmacy Consult for heparin  Indication: atrial fibrillation, hx of PE  Allergies  Allergen Reactions   Codeine Hives and Itching   Ofev  [Nintedanib ] Diarrhea    SEVERE DIARRHEA   Pirfenidone  Diarrhea and Other (See Comments)    Esbriet  (Pirfenidone ) causes elevated LFTs. D/C on 06/14/17 and SEVERE DIARRHEA    Amoxicillin Itching    Patient Measurements: Height: 6' (182.9 cm) Weight: 104 kg (229 lb 4.5 oz) (Wt from 06/25/2024) IBW/kg (Calculated) : 77.6 HEPARIN  DW (KG): 99.1  Vital Signs: Temp: 96.4 F (35.8 C) (08/22 0743) Temp Source: Axillary (08/22 0743) BP: 148/71 (08/22 0743) Pulse Rate: 66 (08/22 0743)  Labs: Recent Labs    07/23/24 1731 07/23/24 1737 07/23/24 1831 07/23/24 1938 07/23/24 2353 07/24/24 0019 07/24/24 0158 07/24/24 1036 07/24/24 1100 07/24/24 1412 07/24/24 1651 07/25/24 0022 07/25/24 0442 07/25/24 1046 07/25/24 2126 07/25/24 2343 07/26/24 0621  HGB 14.7   < > 15.6  --   --  11.9*  --  11.9*  --   --   --   --   --   --   --   --  11.1*  HCT 44.8   < > 46.0  --   --  35.0*  --  36.8*  --   --   --   --   --   --   --   --  34.6*  PLT 245  --   --   --   --   --   --  170  --   --   --   --   --   --   --   --  152  APTT  --   --   --   --   --   --   --   --    < >  --   --    < >  --  57* >200* 72* 65*  HEPARINUNFRC  --   --   --   --   --   --    < >  --   --   --   --    < >  --  1.00*  --  0.70 0.53  CREATININE 1.67*  --  1.70*  --   --   --   --   --   --   --   --   --  1.61*  --   --   --  1.12  CKTOTAL  --   --   --   --  307  --   --   --   --   --   --   --   --   --   --   --   --   TROPONINIHS 18*  --   --    < > 3,304*  --    < > 5,053*  --  5,648* 4,867*  --   --   --   --   --   --    < > = values in this interval not displayed.    Estimated Creatinine Clearance: 62.3 mL/min (by C-G formula based on SCr of 1.12 mg/dL).  Medications:  -Eliquis  5mg  PO BID (Last dose 8/19  AM)  Assessment: 41 yoM presented to ED with emesis, hypotension, and SOB. Pharmacy consulted to dose heparin  for afib/hx of PE. PMH includes: CAD status post CABG, CHF, sick sinus syndrome, pulmonary  fibrosis on home oxygen (2L), TAVR.   -5/25: NM perfusion indicated high probability of PE, CTa this admission showing no acute PE -CT abdomen: ileus vs partial SBO + resolving hematoma in R. Retroperitoneum   Heparin  level therapeutic  Goal of Therapy:  Heparin  level 0.3-0.7 units/ml aPTT 66-102 seconds Monitor platelets by anticoagulation protocol: Yes   Plan:  Continue IV heparin  to 1100 units/hr. DC daily PTT Follow up daily heparin  level Continue to monitor H&H and platelets Follow up transition back to Eliquis    Thank you. Olam Monte, PharmD  07/26/2024 8:16 AM   [facility] pharmacy phone numbers are listed on [email or web address]

## 2024-07-26 NOTE — Progress Notes (Addendum)
   07/26/24 1015  Mobility  Activity Dangled on edge of bed  Level of Assistance Standby assist, set-up cues, supervision of patient - no hands on  Assistive Device None  Range of Motion/Exercises Right leg;Left leg  Activity Response Tolerated fair  Mobility Referral Yes  Mobility visit 1 Mobility  Mobility Specialist Start Time (ACUTE ONLY) 1015  Mobility Specialist Stop Time (ACUTE ONLY) 1025  Mobility Specialist Time Calculation (min) (ACUTE ONLY) 10 min   Mobility Specialist: Progress Note  Pre-Mobility:      HR 88, SpO2 100% 4L Post-Mobility:    HR 76, SpO2 100% 4L  Pt agreeable to mobility session - received in bed. C/o SOB, VSS. Returned to EOB with all needs met - call bell within reach.  Deferred further mobility. Wife and RN present.   BLE Exercises (x10): Knee Raises, Extensions  Virgle Boards, BS Mobility Specialist Please contact via SecureChat or  Rehab office at [phone].

## 2024-07-26 NOTE — Progress Notes (Signed)
 Pt at this time refusing wound care dressing change. According to patient and wife, wife normally performs dressing change at night and it was performed last night, 8/21, before bed. Wife states nightly is patients at home schedule for dressing change. RN will inform AM RN.

## 2024-07-28 LAB — CULTURE, BLOOD (ROUTINE X 2)
Culture: NO GROWTH
Culture: NO GROWTH

## 2024-08-01 ENCOUNTER — Ambulatory Visit: Admitting: Orthopedic Surgery

## 2024-08-01 DIAGNOSIS — Z89439 Acquired absence of unspecified foot: Secondary | ICD-10-CM | POA: Diagnosis not present

## 2024-08-01 DIAGNOSIS — I739 Peripheral vascular disease, unspecified: Secondary | ICD-10-CM

## 2024-08-01 DIAGNOSIS — L97521 Non-pressure chronic ulcer of other part of left foot limited to breakdown of skin: Secondary | ICD-10-CM | POA: Diagnosis not present

## 2024-08-01 DIAGNOSIS — Z89511 Acquired absence of right leg below knee: Secondary | ICD-10-CM

## 2024-08-02 ENCOUNTER — Encounter: Payer: Self-pay | Admitting: Orthopedic Surgery

## 2024-08-02 NOTE — Progress Notes (Signed)
 Office Visit Note   Patient: Shane [name]           Date of Birth: Apr 29, 1941           MRN: [id number] Visit Date: 08/01/2024              Requested by: Shepard Ade, MD [address],  [place] [address] PCP: Shepard Ade, MD  Chief Complaint  Patient presents with   Left Foot - Wound Check    Hx TMA      HPI: Discussed the use of AI scribe software for clinical note transcription with the patient, who gave verbal consent to proceed.  History of Present Illness Shane [name] is an 83 year old male who presents with a reaction to an unknown substance resulting in a rash and shortness of breath.  He spent four nights and three days at [facility] last week due to a reaction to an unknown substance, which caused a major rash and shortness of breath. The exact cause of the reaction remains unidentified.  The rash has significantly improved since the hospital stay. He did not receive steroids for the reaction. He continues to manage the rash on the top of his ankle with Edrick wet-to-dry dressings, applied at night. A home nurse visits once a week to assist with care.  He has been keeping his foot elevated to reduce swelling, which has been beneficial. He elevates his foot above the level of the heart a couple of times a day. Additionally, he notes that the 'fish skin thing' has helped significantly in the healing process.     Assessment & Plan: Visit Diagnoses:  1. Non-pressure chronic ulcer of other part of left foot limited to breakdown of skin (HCC)   2. PAD (peripheral artery disease) (HCC)   3. Hx of right BKA (HCC)   4. History of transmetatarsal amputation of foot (HCC)     Plan: Assessment and Plan Assessment & Plan Chronic right lower extremity ulcers Chronic ulcers with excellent healing progress. Lateral wound 1 cm with healthy granulation, dorsal ankle ulcer nearly healed. Recent rash and shortness of breath resolved. voshe  dressing and elevation effective. Fish skin graft beneficial. - Continue Bosch wet to dry dressing changes. - Maintain compression and elevation of the right lower extremity. - Schedule follow-up appointment in four weeks.      Follow-Up Instructions: No follow-ups on file.   Ortho Exam  Patient is alert, oriented, no adenopathy, well-dressed, normal affect, normal respiratory effort. Physical Exam SKIN: Lateral wound 1 cm in diameter with 100% healthy granulation tissue and decreased depth. Dorsal ankle ulcer almost completely healed. Foot with decreased swelling no cellulitis no odor.      Imaging: No results found. No images are attached to the encounter.  Labs: Lab Results  Component Value Date   HGBA1C 8.7 (H) 04/09/2024   HGBA1C 8.5 (H) 12/06/2023   HGBA1C 9.0 (H) 06/24/2023   ESRSEDRATE 84 (H) 12/29/2023   ESRSEDRATE 22 (H) 10/14/2016   CRP 3.5 (H) 12/29/2023   CRP 9.0 (H) 12/08/2023   CRP 8.3 (H) 12/09/2020   REPTSTATUS 07/28/2024 FINAL 07/23/2024   GRAMSTAIN  02/19/2021    RARE WBC PRESENT, PREDOMINANTLY PMN NO ORGANISMS SEEN    CULT  07/23/2024    NO GROWTH 5 DAYS Performed at [facility], [address], [place], [place] [address]    [facility] ESCHERICHIA COLI 02/19/2021     Lab Results  Component  Value Date   ALBUMIN  2.6 (L) 07/25/2024   ALBUMIN  3.1 (L) 07/23/2024   ALBUMIN  2.9 (L) 04/11/2024    Lab Results  Component Value Date   MG 2.0 07/24/2024   MG 1.5 (L) 07/23/2024   MG 1.6 (L) 04/09/2024   Lab Results  Component Value Date   VD25OH 32.29 12/14/2022    No results found for: PREALBUMIN    Latest Ref Rng & Units 07/26/2024    6:21 AM 07/24/2024   10:36 AM 07/24/2024   12:19 AM  CBC EXTENDED  WBC 4.0 - 10.5 K/uL 12.7  12.0    RBC 4.22 - 5.81 MIL/uL 3.60  3.90    Hemoglobin 13.0 - 17.0 g/dL 88.8  88.0  88.0   HCT 39.0 - 52.0 % 34.6  36.8  35.0   Platelets 150 - 400 K/uL 152  170       There is no height or weight on  file to calculate BMI.  Orders:  No orders of the defined types were placed in this encounter.  No orders of the defined types were placed in this encounter.    Procedures: No procedures performed  Clinical Data: No additional findings.  ROS:  All other systems negative, except as noted in the HPI. Review of Systems  Objective: Vital Signs: There were no vitals taken for this visit.  Specialty Comments:  No specialty comments available.  PMFS History: Patient Active Problem List   Diagnosis Date Noted   Elevated troponin I level 07/25/2024   History of pulmonary embolus (PE) 07/24/2024   Anaphylactoid reaction 07/24/2024   SBO (small bowel obstruction) (HCC) 07/23/2024   Acute kidney injury superimposed on stage 3a chronic kidney disease (HCC) 04/09/2024   Impaired functional mobility, balance, gait, and endurance 04/09/2024   Non-pressure chronic ulcer of other part of left foot limited to breakdown of skin (HCC) 12/27/2023   Type 2 diabetes mellitus without complication, with long-term current use of insulin  (HCC) 12/12/2023   Esophageal dysphagia 12/11/2023   COVID-19 12/11/2023   Acute on chronic hypoxic respiratory failure (HCC) 12/10/2023   Anemia 12/10/2023   Non-ST elevation (NSTEMI) myocardial infarction (HCC) 12/09/2023   Retroperitoneal bleeding 12/09/2023   History of transcatheter aortic valve replacement (TAVR) 12/09/2023   Chronic heart failure with preserved ejection fraction (HFpEF) (HCC) 12/09/2023   Hx of CABG 12/09/2023   Atherosclerosis of native coronary artery of native heart without angina pectoris 12/09/2023   Hyperkalemia 12/07/2023   Acute on chronic congestive heart failure (HCC) 12/07/2023   Acute kidney injury (nontraumatic) (HCC) 12/06/2023   Acute on chronic combined systolic and diastolic CHF, NYHA class 1 (HCC) 06/22/2023   Acute on chronic systolic (congestive) heart failure (HCC) 06/21/2023   Intermediate stage nonexudative  age-related macular degeneration of both eyes 04/25/2022   Research study patient 12/24/2021   Labile blood glucose    Pulmonary fibrosis (HCC)    Hyponatremia    S/P BKA (below knee amputation) unilateral, right (HCC)    Shortness of breath    Stage 3 chronic kidney disease (HCC)    PAF (paroxysmal atrial fibrillation) (HCC)    Postoperative pain    Atherosclerosis of native arteries of extremities with gangrene, right leg (HCC)    Hardware complicating wound infection (HCC)    Abscess of bursa of right ankle 02/15/2021   Endocarditis 01/19/2021   PAD (peripheral artery disease) (HCC) 12/29/2020   Chronic systolic CHF (congestive heart failure) (HCC) 12/22/2020   History of COVID-19 12/22/2020  SSS (sick sinus syndrome) (HCC) 12/22/2020   Acute bacterial endocarditis    MSSA bacteremia 12/09/2020   Diabetic foot ulcer (HCC) 12/09/2020   Foot drop, right foot    Generalized weakness 12/08/2020   Atrial fibrillation, chronic (HCC) 12/08/2020   NSTEMI (non-ST elevated myocardial infarction) (HCC) 12/08/2020   Adrenal insufficiency (HCC) 11/14/2020   Orthostatic hypotension 11/14/2020   Physical deconditioning 11/14/2020   Difficulty sleeping 11/07/2020   LV dysfunction 11/05/2020   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/04/2020   Hearing loss 11/03/2020   Cardiac device in situ 09/09/2020   COVID-19 virus infection 07/18/2020   Coronary artery disease involving native heart without angina pectoris/ S/P CABG x3 07/10/2020   Chronic kidney disease, stage 3a (HCC) 03/29/2020   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/28/2020   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) 03/16/2020   Right epiretinal membrane 03/16/2020   Posterior vitreous detachment of right eye 03/16/2020   Aortic stenosis, moderate 03/12/2020   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   Near syncope 04/27/2018   Hypoglycemia due to insulin  01/06/2018   Essential hypertension  01/06/2018   Syncope and collapse 01/05/2018   Encounter for therapeutic drug monitoring 06/29/2017   OSA on CPAP 05/05/2017   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HCC) 01/05/2017   Abnormal chest x-ray 10/12/2016   Benign neoplasm of colon 01/14/2013   Gastro-esophageal reflux disease without esophagitis 11/24/2009   Past Medical History:  Diagnosis Date   Acute blood loss anemia    Anxiety    AV block, Mobitz 1    Cataract    Chronic kidney disease    d/t DM   CKD (chronic kidney disease), stage III (HCC)    COVID-19 virus infection 07/18/2020   Last Assessment & Plan:   Formatting of this note might be different from the original.  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 10/2019 Echo: EF 55-60%, Gr2 DD. Nl RV.    Neuromuscular disorder (HCC)    Neuropathy associated with endocrine disorder (HCC)    Nonobstructive CAD (coronary artery disease)    a. 2012 Cath: mod, nonobs dzs; b. 10/2016 MV: EF 60%, no ischemia.   OSA on CPAP 05/05/2017   Unattended Home Sleep Test 7/2/813-AHI 38.6/hour, desaturation to 64%, body weight 261 pounds   PONV (postoperative nausea and vomiting)    Postoperative anemia due to acute blood loss 11/07/2020   Postoperative hemorrhagic shock 03/20/2021   Sleep apnea     uses cpap asked to bring mask and tubing    Family History  Problem Relation Age of Onset   Diabetes Mellitus II Mother    Emphysema Father 51   Heart attack Father    Colon cancer Neg Hx    Esophageal cancer Neg Hx    Rectal  cancer Neg Hx    Stomach cancer Neg Hx     Past Surgical History:  Procedure Laterality Date  ABDOMINAL AORTOGRAM W/LOWER EXTREMITY N/A 12/10/2020   Procedure: ABDOMINAL AORTOGRAM W/LOWER EXTREMITY;  Surgeon: Magda Debby SAILOR, MD;  Location: [facility];  Service: Cardiovascular;  Laterality: N/A;   AMPUTATION Right 01/22/2021   Procedure: 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;  Surgeon: Harden Jerona GAILS, MD;  Location: [facility] OR;  Service: Orthopedics;  Laterality: Right;   AMPUTATION Right 03/17/2021   Procedure: RIGHT BELOW KNEE AMPUTATION;  Surgeon: Harden Jerona GAILS, MD;  Location: [facility] OR;  Service: Orthopedics;  Laterality: Right;   ANKLE FUSION Right 01/22/2021   Procedure: RIGHT FOOT TIBIOCALCANEAL FUSION;  Surgeon: Harden Jerona GAILS, MD;  Location: [facility] OR;  Service: Orthopedics;  Laterality: Right;   ANTERIOR FUSION CERVICAL SPINE  2012   CARDIAC CATHETERIZATION  2011   CARDIAC CATHETERIZATION N/A 11/09/2016   Procedure: Right Heart Cath;  Surgeon: Victory LELON Sharps, MD;  Location: [facility] INVASIVE CV LAB;  Service: Cardiovascular;  Laterality: N/A;   carpel tunnel     left wrist   CATARACT EXTRACTION     CATARACT EXTRACTION W/ INTRAOCULAR LENS  IMPLANT, BILATERAL  2013   CERVICAL LAMINECTOMY  2012   COLONOSCOPY N/A 01/14/2013   Procedure: COLONOSCOPY;  Surgeon: Norleen SAILOR Kiang, MD;  Location: [facility] END[facility]COPY;  Service: Endoscopy;  Laterality: N/A;   CORONARY ARTERY BYPASS GRAFT  11/04/2020   LIMA-LAD, SVG-OM1, SVG-PDA (Dr Norleen [facility]) dc 11/18/2020   EYE SURGERY     I & D EXTREMITY Right 02/19/2021   Procedure: RIGHT ANKLE DEBRIDEMENT AND PLACEMENT ANTIBIOTIC BEADS;  Surgeon: Harden Jerona GAILS, MD;  Location: [facility] OR;  Service: Orthopedics;  Laterality: Right;   KNEE SURGERY  1998   left   LEFT HEART CATH AND CORONARY ANGIOGRAPHY N/A 07/10/2020   Procedure: LEFT HEART CATH AND CORONARY ANGIOGRAPHY;  Surgeon: Wonda Sharper, MD;  Location: [facility] INVASIVE CV LAB;  Service: Cardiovascular;  Laterality: N/A;   LUMBAR  LAMINECTOMY  2003   LUNG BIOPSY Left 12/26/2016   Procedure: LUNG BIOPSY;  Surgeon: Elspeth JAYSON Millers, MD;  Location: [facility] OR;  Service: Thoracic;  Laterality: Left;   PACEMAKER IMPLANT N/A 03/30/2020   Procedure: PACEMAKER IMPLANT;  Surgeon: Waddell Danelle LELON, MD;  Location: [facility];  Service: Cardiovascular;  Laterality: N/A;   PERIPHERAL VASCULAR INTERVENTION Right 12/10/2020   Procedure: PERIPHERAL VASCULAR INTERVENTION;  Surgeon: Magda Debby SAILOR, MD;  Location: [facility];  Service: Cardiovascular;  Laterality: Right;  SFA   POSTERIOR FUSION CERVICAL SPINE  2012   PPM GENERATOR REMOVAL N/A 12/14/2020   Procedure: PPM GENERATOR REMOVAL;  Surgeon: Waddell Danelle LELON, MD;  Location: [facility];  Service: Cardiovascular;  Laterality: N/A;   TEE WITHOUT CARDIOVERSION N/A 12/11/2020   Procedure: TRANSESOPHAGEAL ECHOCARDIOGRAM (TEE);  Surgeon: Barbaraann Darryle Debby, MD;  Location: [facility] ENDOSCOPY;  Service: Cardiovascular;  Laterality: N/A;   TRIGGER FINGER RELEASE  2011   4th finger left hand   VIDEO ASSISTED THORACOSCOPY Left 12/26/2016   Procedure: VIDEO ASSISTED THORACOSCOPY;  Surgeon: Elspeth JAYSON Millers, MD;  Location: [facility] OR;  Service: Thoracic;  Laterality: Left;   VIDEO BRONCHOSCOPY N/A 12/26/2016   Procedure: VIDEO BRONCHOSCOPY;  Surgeon: Elspeth JAYSON Millers, MD;  Location: [facility] OR;  Service: Thoracic;  Laterality: N/A;   Social History   Occupational History   Not on file  Tobacco Use   Smoking status: Never    Passive exposure: Past   Smokeless tobacco: Never  Vaping Use   Vaping status: Never Used  Substance and  Sexual Activity   Alcohol use: No   Drug use: No   Sexual activity: Not Currently

## 2024-08-16 ENCOUNTER — Telehealth: Payer: Self-pay

## 2024-08-16 NOTE — Telephone Encounter (Signed)
 Devon, RN called stating she needs new orders from a wound on his heel. Fax order to [phone].

## 2024-08-20 ENCOUNTER — Other Ambulatory Visit: Payer: Self-pay

## 2024-08-20 NOTE — Telephone Encounter (Signed)
 Faxed

## 2024-08-22 ENCOUNTER — Ambulatory Visit: Admitting: Orthopedic Surgery

## 2024-08-22 DIAGNOSIS — Z89511 Acquired absence of right leg below knee: Secondary | ICD-10-CM | POA: Diagnosis not present

## 2024-08-22 DIAGNOSIS — I739 Peripheral vascular disease, unspecified: Secondary | ICD-10-CM | POA: Diagnosis not present

## 2024-08-22 DIAGNOSIS — L97521 Non-pressure chronic ulcer of other part of left foot limited to breakdown of skin: Secondary | ICD-10-CM

## 2024-08-25 ENCOUNTER — Encounter: Payer: Self-pay | Admitting: Orthopedic Surgery

## 2024-08-25 NOTE — Progress Notes (Signed)
 Office Visit Note   Patient: Shane [name]           Date of Birth: [birth year]           MRN: [id number] Visit Date: 08/22/2024              Requested by: Shepard Ade, MD [address] [place],  [place] [address] PCP: Shepard Ade, MD  Chief Complaint  Patient presents with   Left Foot - Wound Check      HPI: Discussed the use of AI scribe software for clinical note transcription with the patient, who gave verbal consent to proceed.  History of Present Illness Shane [name] is an 83 year old male with venous insufficiency who presents with worsening foot ulcers.  He has a worsening ulcer on the top of his foot, which was nearly healed but has since deteriorated. He suspects that the compression sock may be contributing to the issue by causing pressure and wrinkles, especially since his foot is shorter, which can double the pressure if a wrinkle forms. The ulcer measures one centimeter in diameter and about three millimeters deep.  There is also a wound over the dorsum of the ankle, measuring one by 0.5 centimeters.  He mentions a recent skin tear on his heel, which was treated with Steri strips. A home care nurse confirmed that the heel is healing well with no drainage or signs of infection.  He has been experiencing fluctuations in his blood sugar levels, with lows reaching 50 and highs up to 250, particularly during the night. He is unsure if the meter is accurate, as it does not record these lows. No missed meals are reported, and he is concerned about the variability in his readings.     Assessment & Plan: Visit Diagnoses:  1. Non-pressure chronic ulcer of other part of left foot limited to breakdown of skin (HCC)   2. Hx of right BKA (HCC)   3. PAD (peripheral artery disease) (HCC)     Plan: Assessment and Plan Assessment & Plan Chronic venous insufficiency with lower extremity ulcerations Chronic venous insufficiency with ulcerations on  the dorsum of the ankle and the outside of the foot. Wounds have healthy granulation tissue, no infection, drainage, or exposed bone or tendons. Ankle ulceration likely due to compression from sock and bandage. Persistent venous swelling noted. - Use Vosh on 4x4 gauze and apply to wounds. - Loosely wrap with an Ace Wrap. - Discontinue use of compression sock until wounds heal. - Re-evaluate in two weeks.  Lower extremity edema Persistent lower extremity edema associated with chronic venous insufficiency. No signs of ascending cellulitis.  Blood sugar fluctuations Reports of blood sugar fluctuations with episodes of hypoglycemia and hyperglycemia, particularly at night. Possible inaccuracies in meter readings. Hypoglycemia may be causing rebound hyperglycemia at night. - Monitor blood sugar levels closely, especially before bed. - Consult with primary care physician for potential adjustment of diabetes management.      Follow-Up Instructions: Return in about 4 weeks (around 09/19/2024).   Ortho Exam  Patient is alert, oriented, no adenopathy, well-dressed, normal affect, normal respiratory effort. Physical Exam ABDOMEN: Persistent pitting edema. SKIN: Wounds with healthy granulation tissue, no exposed bone or tendons, no ascending cellulitis. Wound on foot 1 cm diameter, 3 mm deep, healthy granulation tissue. Wound on dorsum of ankle 1x0.5 cm, healthy tissue.      Imaging: No results found.    Labs: Lab Results  Component Value Date  HGBA1C 8.7 (H) 04/09/2024   HGBA1C 8.5 (H) 12/06/2023   HGBA1C 9.0 (H) 06/24/2023   ESRSEDRATE 84 (H) 12/29/2023   ESRSEDRATE 22 (H) 10/14/2016   CRP 3.5 (H) 12/29/2023   CRP 9.0 (H) 12/08/2023   CRP 8.3 (H) 12/09/2020   REPTSTATUS 07/28/2024 FINAL 07/23/2024   GRAMSTAIN  02/19/2021    RARE WBC PRESENT, PREDOMINANTLY PMN NO ORGANISMS SEEN    CULT  07/23/2024    NO GROWTH 5 DAYS Performed at [facility], [address],  [place], [place] [address]    [facility] ESCHERICHIA COLI 02/19/2021     Lab Results  Component Value Date   ALBUMIN  2.6 (L) 07/25/2024   ALBUMIN  3.1 (L) 07/23/2024   ALBUMIN  2.9 (L) 04/11/2024    Lab Results  Component Value Date   MG 2.0 07/24/2024   MG 1.5 (L) 07/23/2024   MG 1.6 (L) 04/09/2024   Lab Results  Component Value Date   VD25OH 32.29 12/14/2022    No results found for: PREALBUMIN    Latest Ref Rng & Units 07/26/2024    6:21 AM 07/24/2024   10:36 AM 07/24/2024   12:19 AM  CBC EXTENDED  WBC 4.0 - 10.5 K/uL 12.7  12.0    RBC 4.22 - 5.81 MIL/uL 3.60  3.90    Hemoglobin 13.0 - 17.0 g/dL 88.8  88.0  88.0   HCT 39.0 - 52.0 % 34.6  36.8  35.0   Platelets 150 - 400 K/uL 152  170       There is no height or weight on file to calculate BMI.  Orders:  No orders of the defined types were placed in this encounter.  No orders of the defined types were placed in this encounter.    Procedures: No procedures performed  Clinical Data: No additional findings.  ROS:  All other systems negative, except as noted in the HPI. Review of Systems  Objective: Vital Signs: There were no vitals taken for this visit.  Specialty Comments:  No specialty comments available.  PMFS History: Patient Active Problem List   Diagnosis Date Noted   Elevated troponin I level 07/25/2024   History of pulmonary embolus (PE) 07/24/2024   Anaphylactoid reaction 07/24/2024   SBO (small bowel obstruction) (HCC) 07/23/2024   Acute kidney injury superimposed on stage 3a chronic kidney disease (HCC) 04/09/2024   Impaired functional mobility, balance, gait, and endurance 04/09/2024   Non-pressure chronic ulcer of other part of left foot limited to breakdown of skin (HCC) 12/27/2023   Type 2 diabetes mellitus without complication, with long-term current use of insulin  (HCC) 12/12/2023   Esophageal dysphagia 12/11/2023   COVID-19 12/11/2023   Acute on chronic hypoxic respiratory failure (HCC)  12/10/2023   Anemia 12/10/2023   Non-ST elevation (NSTEMI) myocardial infarction (HCC) 12/09/2023   Retroperitoneal bleeding 12/09/2023   History of transcatheter aortic valve replacement (TAVR) 12/09/2023   Chronic heart failure with preserved ejection fraction (HFpEF) (HCC) 12/09/2023   Hx of CABG 12/09/2023   Atherosclerosis of native coronary artery of native heart without angina pectoris 12/09/2023   Hyperkalemia 12/07/2023   Acute on chronic congestive heart failure (HCC) 12/07/2023   Acute kidney injury (nontraumatic) (HCC) 12/06/2023   Acute on chronic combined systolic and diastolic CHF, NYHA class 1 (HCC) 06/22/2023   Acute on chronic systolic (congestive) heart failure (HCC) 06/21/2023   Intermediate stage nonexudative age-related macular degeneration of both eyes 04/25/2022   Research study patient 12/24/2021   Labile blood glucose  Pulmonary fibrosis (HCC)    Hyponatremia    S/P BKA (below knee amputation) unilateral, right (HCC)    Shortness of breath    Stage 3 chronic kidney disease (HCC)    PAF (paroxysmal atrial fibrillation) (HCC)    Postoperative pain    Atherosclerosis of native arteries of extremities with gangrene, right leg (HCC)    Hardware complicating wound infection (HCC)    Abscess of bursa of right ankle 02/15/2021   Endocarditis 01/19/2021   PAD (peripheral artery disease) (HCC) 12/29/2020   Chronic systolic CHF (congestive heart failure) (HCC) 12/22/2020   History of COVID-19 12/22/2020   SSS (sick sinus syndrome) (HCC) 12/22/2020   Acute bacterial endocarditis    MSSA bacteremia 12/09/2020   Diabetic foot ulcer (HCC) 12/09/2020   Foot drop, right foot    Generalized weakness 12/08/2020   Atrial fibrillation, chronic (HCC) 12/08/2020   NSTEMI (non-ST elevated myocardial infarction) (HCC) 12/08/2020   Adrenal insufficiency (HCC) 11/14/2020   Orthostatic hypotension 11/14/2020   Physical deconditioning 11/14/2020   Difficulty sleeping  11/07/2020   LV dysfunction 11/05/2020   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/04/2020   Hearing loss 11/03/2020   Cardiac device in situ 09/09/2020   COVID-19 virus infection 07/18/2020   Coronary artery disease involving native heart without angina pectoris/ S/P CABG x3 07/10/2020   Chronic kidney disease, stage 3a (HCC) 03/29/2020   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/28/2020   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) 03/16/2020   Right epiretinal membrane 03/16/2020   Posterior vitreous detachment of right eye 03/16/2020   Aortic stenosis, moderate 03/12/2020   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   Near syncope 04/27/2018   Hypoglycemia due to insulin  01/06/2018   Essential hypertension 01/06/2018   Syncope and collapse 01/05/2018   Encounter for therapeutic drug monitoring 06/29/2017   OSA on CPAP 05/05/2017   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HCC) 01/05/2017   Abnormal chest x-ray 10/12/2016   Benign neoplasm of colon 01/14/2013   Gastro-esophageal reflux disease without esophagitis 11/24/2009   Past Medical History:  Diagnosis Date   Acute blood loss anemia    Anxiety    AV block, Mobitz 1    Cataract    Chronic kidney disease    d/t DM   CKD (chronic kidney disease), stage III (HCC)    COVID-19 virus infection 07/18/2020   Last Assessment & Plan:   Formatting of this note might be different from the original.  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 10/2019 Echo: EF  55-60%, Gr2 DD. Nl RV.    Neuromuscular disorder (HCC)    Neuropathy associated with endocrine disorder (HCC)    Nonobstructive CAD (coronary artery disease)    a. 2012 Cath: mod, nonobs dzs; b. 10/2016 MV: EF 60%, no ischemia.   OSA on CPAP 05/05/2017   Unattended Home Sleep Test 7/2/813-AHI 38.6/hour, desaturation to 64%, body weight 261 pounds  PONV (postoperative nausea and vomiting)    Postoperative anemia due to acute blood loss 11/07/2020   Postoperative hemorrhagic shock 03/20/2021   Sleep apnea     uses cpap asked to bring mask and tubing    Family History  Problem Relation Age of Onset   Diabetes Mellitus II Mother    Emphysema Father 29   Heart attack Father    Colon cancer Neg Hx    Esophageal cancer Neg Hx    Rectal cancer Neg Hx    Stomach cancer Neg Hx     Past Surgical History:  Procedure Laterality Date   ABDOMINAL AORTOGRAM W/LOWER EXTREMITY N/A 12/10/2020   Procedure: ABDOMINAL AORTOGRAM W/LOWER EXTREMITY;  Surgeon: Magda Debby SAILOR, MD;  Location: [facility];  Service: Cardiovascular;  Laterality: N/A;   AMPUTATION Right 01/22/2021   Procedure: 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;  Surgeon: Harden Jerona GAILS, MD;  Location: [facility] OR;  Service: Orthopedics;  Laterality: Right;   AMPUTATION Right 03/17/2021   Procedure: RIGHT BELOW KNEE AMPUTATION;  Surgeon: Harden Jerona GAILS, MD;  Location: [facility] OR;  Service: Orthopedics;  Laterality: Right;   ANKLE FUSION Right 01/22/2021   Procedure: RIGHT FOOT TIBIOCALCANEAL FUSION;  Surgeon: Harden Jerona GAILS, MD;  Location: [facility] OR;  Service: Orthopedics;  Laterality: Right;   ANTERIOR FUSION CERVICAL SPINE  2012   CARDIAC CATHETERIZATION  2011   CARDIAC CATHETERIZATION N/A 11/09/2016   Procedure: Right Heart Cath;  Surgeon: Victory LELON Sharps, MD;  Location: [facility] INVASIVE CV LAB;  Service: Cardiovascular;  Laterality: N/A;   carpel tunnel     left wrist   CATARACT EXTRACTION     CATARACT EXTRACTION W/ INTRAOCULAR LENS  IMPLANT, BILATERAL  2013    CERVICAL LAMINECTOMY  2012   COLONOSCOPY N/A 01/14/2013   Procedure: COLONOSCOPY;  Surgeon: Norleen SAILOR Kiang, MD;  Location: [facility] ENDOSCOPY;  Service: Endoscopy;  Laterality: N/A;   CORONARY ARTERY BYPASS GRAFT  11/04/2020   LIMA-LAD, SVG-OM1, SVG-PDA (Dr Norleen Exon [facility]) dc 11/18/2020   EYE SURGERY     I & D EXTREMITY Right 02/19/2021   Procedure: RIGHT ANKLE DEBRIDEMENT AND PLACEMENT ANTIBIOTIC BEADS;  Surgeon: Harden Jerona GAILS, MD;  Location: [facility] OR;  Service: Orthopedics;  Laterality: Right;   KNEE SURGERY  1998   left   LEFT HEART CATH AND CORONARY ANGIOGRAPHY N/A 07/10/2020   Procedure: LEFT HEART CATH AND CORONARY ANGIOGRAPHY;  Surgeon: Wonda Sharper, MD;  Location: [facility] INVASIVE CV LAB;  Service: Cardiovascular;  Laterality: N/A;   LUMBAR LAMINECTOMY  2003   LUNG BIOPSY Left 12/26/2016   Procedure: LUNG BIOPSY;  Surgeon: Elspeth JAYSON Millers, MD;  Location: [facility] OR;  Service: Thoracic;  Laterality: Left;   PACEMAKER IMPLANT N/A 03/30/2020   Procedure: PACEMAKER IMPLANT;  Surgeon: Waddell Danelle LELON, MD;  Location: [facility];  Service: Cardiovascular;  Laterality: N/A;   PERIPHERAL VASCULAR INTERVENTION Right 12/10/2020   Procedure: PERIPHERAL VASCULAR INTERVENTION;  Surgeon: Magda Debby SAILOR, MD;  Location: [facility];  Service: Cardiovascular;  Laterality: Right;  SFA   POSTERIOR FUSION CERVICAL SPINE  2012   PPM GENERATOR REMOVAL N/A 12/14/2020   Procedure: PPM GENERATOR REMOVAL;  Surgeon: Waddell Danelle LELON, MD;  Location: [facility];  Service: Cardiovascular;  Laterality: N/A;   TEE WITHOUT CARDIOVERSION N/A 12/11/2020   Procedure: TRANSESOPHAGEAL ECHOCARDIOGRAM (TEE);  Surgeon: Barbaraann Darryle Debby, MD;  Location: [facility] ENDOSCOPY;  Service: Cardiovascular;  Laterality: N/A;   TRIGGER FINGER RELEASE  2011   4th finger left hand   VIDEO ASSISTED THORACOSCOPY Left 12/26/2016   Procedure: VIDEO ASSISTED THORACOSCOPY;  Surgeon: Elspeth JAYSON Millers, MD;  Location: [facility] OR;  Service:  Thoracic;  Laterality: Left;   VIDEO BRONCHOSCOPY N/A 12/26/2016   Procedure: VIDEO BRONCHOSCOPY;  Surgeon: Elspeth JAYSON Millers, MD;  Location: [facility] OR;  Service: Thoracic;  Laterality: N/A;   Social History   Occupational History   Not on file  Tobacco Use   Smoking status: Never    Passive exposure: Past   Smokeless tobacco: Never  Vaping Use   Vaping status: Never Used  Substance and Sexual Activity   Alcohol use: No   Drug use: No   Sexual activity: Not Currently

## 2024-08-29 ENCOUNTER — Ambulatory Visit: Admitting: Orthopedic Surgery

## 2024-09-05 ENCOUNTER — Ambulatory Visit: Admitting: Orthopedic Surgery

## 2024-09-16 ENCOUNTER — Ambulatory Visit: Admitting: Orthopedic Surgery

## 2024-09-16 DIAGNOSIS — L97521 Non-pressure chronic ulcer of other part of left foot limited to breakdown of skin: Secondary | ICD-10-CM

## 2024-09-16 DIAGNOSIS — I739 Peripheral vascular disease, unspecified: Secondary | ICD-10-CM

## 2024-09-16 DIAGNOSIS — Z89511 Acquired absence of right leg below knee: Secondary | ICD-10-CM | POA: Diagnosis not present

## 2024-09-16 DIAGNOSIS — Z89439 Acquired absence of unspecified foot: Secondary | ICD-10-CM | POA: Diagnosis not present

## 2024-09-17 ENCOUNTER — Encounter: Payer: Self-pay | Admitting: Orthopedic Surgery

## 2024-09-17 NOTE — Progress Notes (Signed)
 Office Visit Note   Patient: Shane [name]           Date of Birth: 05/03/1941           MRN: [id number] Visit Date: 09/16/2024              Requested by: Shepard Ade, MD [address] [place],  [place] [address] PCP: Shepard Ade, MD  Chief Complaint  Patient presents with   Left Foot - Follow-up      HPI: Discussed the use of AI scribe software for clinical note transcription with the patient, who gave verbal consent to proceed.  History of Present Illness Shane [name] is an 83 year old male who presents for follow-up of foot ulcers and wound care management.  He has a pressure ulcer on his heel, which developed from prolonged pressure, leading to a blister that has now formed a scab. He is using Vosh for dressing changes and Aquaphor to maintain skin softness. The home care nurse initially used SteriStrips and later recommended Medihoney for the heel ulcer.  He is using Vosh and gauze for dressing changes, followed by an Ace wrap to secure the dressing. He has ordered [employer] and through his home care nurse via insurance.  The dorsal foot ulcer is healing well with superficial epithelialization. He continues to use Vosh and dial soap for cleansing during dressing changes.     Assessment & Plan: Visit Diagnoses:  1. Non-pressure chronic ulcer of other part of left foot limited to breakdown of skin (HCC)   2. Hx of right BKA (HCC)   3. PAD (peripheral artery disease)   4. History of transmetatarsal amputation of foot (HCC)     Plan: Assessment and Plan Assessment & Plan Right heel pressure ulcer Pressure ulcer on right heel with flat eschar due to pressure. - Elevate heel to relieve pressure. - Use Vosh dressing changes. - Consider Medihoney as alternative dressing. - Keep ulcer clean with soap and water during showers.  Right lateral transmetatarsal amputation site ulcer Ulcer at amputation site with healthy granulation tissue,  healing from inside out. - Continue Vosh dressing changes after cleansing with Dial soap. - Ensure clean wound edges for closure.  Right dorsal foot ulcer Right dorsal foot ulcer with superficial epithelialization, healing well. - Continue Vosh dressing changes after cleansing with Dial soap.      Follow-Up Instructions: Return in about 4 weeks (around 10/14/2024).   Ortho Exam  Patient is alert, oriented, no adenopathy, well-dressed, normal affect, normal respiratory effort. Physical Exam EXTREMITIES: Pressure ulcer on heel, 1 cm diameter. Lateral transmetatarsal amputation ulcer debrided to bleeding, viable granulation, measures 1.5 x 2 cm after debridement. Dorsal foot ulcer 0.5 mm, superficial epithelialization, healing well.      Imaging: No results found. No images are attached to the encounter.  Labs: Lab Results  Component Value Date   HGBA1C 8.7 (H) 04/09/2024   HGBA1C 8.5 (H) 12/06/2023   HGBA1C 9.0 (H) 06/24/2023   ESRSEDRATE 84 (H) 12/29/2023   ESRSEDRATE 22 (H) 10/14/2016   CRP 3.5 (H) 12/29/2023   CRP 9.0 (H) 12/08/2023   CRP 8.3 (H) 12/09/2020   REPTSTATUS 07/28/2024 FINAL 07/23/2024   GRAMSTAIN  02/19/2021    RARE WBC PRESENT, PREDOMINANTLY PMN NO ORGANISMS SEEN    CULT  07/23/2024    NO GROWTH 5 DAYS Performed at [facility], [address], [place], [place] [address]    [facility] ESCHERICHIA COLI 02/19/2021  Lab Results  Component Value Date   ALBUMIN  2.6 (L) 07/25/2024   ALBUMIN  3.1 (L) 07/23/2024   ALBUMIN  2.9 (L) 04/11/2024    Lab Results  Component Value Date   MG 2.0 07/24/2024   MG 1.5 (L) 07/23/2024   MG 1.6 (L) 04/09/2024   Lab Results  Component Value Date   VD25OH 32.29 12/14/2022    No results found for: PREALBUMIN    Latest Ref Rng & Units 07/26/2024    6:21 AM 07/24/2024   10:36 AM 07/24/2024   12:19 AM  CBC EXTENDED  WBC 4.0 - 10.5 K/uL 12.7  12.0    RBC 4.22 - 5.81 MIL/uL 3.60  3.90    Hemoglobin 13.0  - 17.0 g/dL 88.8  88.0  88.0   HCT 39.0 - 52.0 % 34.6  36.8  35.0   Platelets 150 - 400 K/uL 152  170       There is no height or weight on file to calculate BMI.  Orders:  No orders of the defined types were placed in this encounter.  No orders of the defined types were placed in this encounter.    Procedures: No procedures performed  Clinical Data: No additional findings.  ROS:  All other systems negative, except as noted in the HPI. Review of Systems  Objective: Vital Signs: There were no vitals taken for this visit.  Specialty Comments:  No specialty comments available.  PMFS History: Patient Active Problem List   Diagnosis Date Noted   Elevated troponin I level 07/25/2024   History of pulmonary embolus (PE) 07/24/2024   Anaphylactoid reaction 07/24/2024   SBO (small bowel obstruction) (HCC) 07/23/2024   Acute kidney injury superimposed on stage 3a chronic kidney disease (HCC) 04/09/2024   Impaired functional mobility, balance, gait, and endurance 04/09/2024   Non-pressure chronic ulcer of other part of left foot limited to breakdown of skin (HCC) 12/27/2023   Type 2 diabetes mellitus without complication, with long-term current use of insulin  (HCC) 12/12/2023   Esophageal dysphagia 12/11/2023   COVID-19 12/11/2023   Acute on chronic hypoxic respiratory failure (HCC) 12/10/2023   Anemia 12/10/2023   Non-ST elevation (NSTEMI) myocardial infarction (HCC) 12/09/2023   Retroperitoneal bleeding 12/09/2023   History of transcatheter aortic valve replacement (TAVR) 12/09/2023   Chronic heart failure with preserved ejection fraction (HFpEF) (HCC) 12/09/2023   Hx of CABG 12/09/2023   Atherosclerosis of native coronary artery of native heart without angina pectoris 12/09/2023   Hyperkalemia 12/07/2023   Acute on chronic congestive heart failure (HCC) 12/07/2023   Acute kidney injury (nontraumatic) 12/06/2023   Acute on chronic combined systolic and diastolic CHF, NYHA  class 1 (HCC) 06/22/2023   Acute on chronic systolic (congestive) heart failure (HCC) 06/21/2023   Intermediate stage nonexudative age-related macular degeneration of both eyes 04/25/2022   Research study patient 12/24/2021   Labile blood glucose    Pulmonary fibrosis (HCC)    Hyponatremia    S/P BKA (below knee amputation) unilateral, right (HCC)    Shortness of breath    Stage 3 chronic kidney disease (HCC)    PAF (paroxysmal atrial fibrillation) (HCC)    Postoperative pain    Atherosclerosis of native arteries of extremities with gangrene, right leg (HCC)    Hardware complicating wound infection    Abscess of bursa of right ankle 02/15/2021   Endocarditis 01/19/2021   PAD (peripheral artery disease) 12/29/2020   Chronic systolic CHF (congestive heart failure) (HCC) 12/22/2020   History of COVID-19  12/22/2020   SSS (sick sinus syndrome) (HCC) 12/22/2020   Acute bacterial endocarditis    MSSA bacteremia 12/09/2020   Diabetic foot ulcer (HCC) 12/09/2020   Foot drop, right foot    Generalized weakness 12/08/2020   Atrial fibrillation, chronic (HCC) 12/08/2020   NSTEMI (non-ST elevated myocardial infarction) (HCC) 12/08/2020   Adrenal insufficiency 11/14/2020   Orthostatic hypotension 11/14/2020   Physical deconditioning 11/14/2020   Difficulty sleeping 11/07/2020   LV dysfunction 11/05/2020   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/04/2020   Hearing loss 11/03/2020   Cardiac device in situ 09/09/2020   COVID-19 virus infection 07/18/2020   Coronary artery disease involving native heart without angina pectoris/ S/P CABG x3 07/10/2020   Chronic kidney disease, stage 3a (HCC) 03/29/2020   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/28/2020   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) 03/16/2020   Right epiretinal membrane 03/16/2020   Posterior vitreous detachment of right eye 03/16/2020   Aortic stenosis, moderate 03/12/2020   RBBB with left anterior fascicular block  03/12/2020   Near syncope 04/27/2018   Hypoglycemia due to insulin  01/06/2018   Essential hypertension 01/06/2018   Syncope and collapse 01/05/2018   Encounter for therapeutic drug monitoring 06/29/2017   OSA on CPAP 05/05/2017   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HCC) 01/05/2017   Abnormal chest x-ray 10/12/2016   Benign neoplasm of colon 01/14/2013   Gastro-esophageal reflux disease without esophagitis 11/24/2009   Past Medical History:  Diagnosis Date   Acute blood loss anemia    Anxiety    AV block, Mobitz 1    Cataract    Chronic kidney disease    d/t DM   CKD (chronic kidney disease), stage III (HCC)    COVID-19 virus infection 07/18/2020   Last Assessment & Plan:   Formatting of this note might be different from the original.  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 10/2019 Echo: EF 55-60%, Gr2 DD. Nl RV.    Neuromuscular disorder (HCC)    Neuropathy associated with endocrine disorder    Nonobstructive CAD (coronary artery disease)    a. 2012 Cath: mod, nonobs dzs; b. 10/2016 MV: EF 60%, no ischemia.   OSA on CPAP 05/05/2017   Unattended Home Sleep Test 7/2/813-AHI 38.6/hour, desaturation to 64%, body weight 261 pounds   PONV (postoperative nausea and vomiting)    Postoperative anemia due to acute blood loss 11/07/2020   Postoperative hemorrhagic shock 03/20/2021   Sleep apnea     uses cpap asked to bring mask and tubing    Family History  Problem Relation Age of Onset   Diabetes Mellitus II Mother     Emphysema Father 51   Heart attack Father    Colon cancer Neg Hx    Esophageal cancer Neg Hx    Rectal cancer Neg Hx    Stomach cancer Neg Hx     Past Surgical History:  Procedure Laterality Date  ABDOMINAL AORTOGRAM W/LOWER EXTREMITY N/A 12/10/2020   Procedure: ABDOMINAL AORTOGRAM W/LOWER EXTREMITY;  Surgeon: Magda Debby SAILOR, MD;  Location: [facility] LAB;  Service: Cardiovascular;  Laterality: N/A;   AMPUTATION Right 01/22/2021   Procedure: 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;  Surgeon: Harden Jerona GAILS, MD;  Location: [facility] OR;  Service: Orthopedics;  Laterality: Right;   AMPUTATION Right 03/17/2021   Procedure: RIGHT BELOW KNEE AMPUTATION;  Surgeon: Harden Jerona GAILS, MD;  Location: [facility] OR;  Service: Orthopedics;  Laterality: Right;   ANKLE FUSION Right 01/22/2021   Procedure: RIGHT FOOT TIBIOCALCANEAL FUSION;  Surgeon: Harden Jerona GAILS, MD;  [facility];  Service: Orthopedics;  Laterality: Right;   ANTERIOR FUSION CERVICAL SPINE  2012   CARDIAC CATHETERIZATION  2011   CARDIAC CATHETERIZATION N/A 11/09/2016   Procedure: Right Heart Cath;  Surgeon: Victory LELON Sharps, MD;  Location: [facility] INVASIVE CV LAB;  Service: Cardiovascular;  Laterality: N/A;   carpel tunnel     left wrist   CATARACT EXTRACTION     CATARACT EXTRACTION W/ INTRAOCULAR LENS  IMPLANT, BILATERAL  2013   CERVICAL LAMINECTOMY  2012   COLONOSCOPY N/A 01/14/2013   Procedure: COLONOSCOPY;  Surgeon: Norleen SAILOR Kiang, MD;  Location: [facility] ENDOSCOPY;  Service: Endoscopy;  Laterality: N/A;   CORONARY ARTERY BYPASS GRAFT  11/04/2020   LIMA-LAD, SVG-OM1, SVG-PDA (Dr Norleen [facility]) dc 11/18/2020   EYE SURGERY     I & D EXTREMITY Right 02/19/2021   Procedure: RIGHT ANKLE DEBRIDEMENT AND PLACEMENT ANTIBIOTIC BEADS;  Surgeon: Harden Jerona GAILS, MD;  Location: [facility] OR;  Service: Orthopedics;  Laterality: Right;   KNEE SURGERY  1998   left   LEFT HEART CATH AND CORONARY ANGIOGRAPHY N/A 07/10/2020   Procedure: LEFT HEART CATH AND CORONARY ANGIOGRAPHY;  Surgeon:  Wonda Sharper, MD;  Location: [facility] INVASIVE CV LAB;  Service: Cardiovascular;  Laterality: N/A;   LUMBAR LAMINECTOMY  2003   LUNG BIOPSY Left 12/26/2016   Procedure: LUNG BIOPSY;  Surgeon: Elspeth JAYSON Millers, MD;  Location: [facility] OR;  Service: Thoracic;  Laterality: Left;   PACEMAKER IMPLANT N/A 03/30/2020   Procedure: PACEMAKER IMPLANT;  Surgeon: Waddell Danelle LELON, MD;  Location: [facility];  Service: Cardiovascular;  Laterality: N/A;   PERIPHERAL VASCULAR INTERVENTION Right 12/10/2020   Procedure: PERIPHERAL VASCULAR INTERVENTION;  Surgeon: Magda Debby SAILOR, MD;  Location: [facility];  Service: Cardiovascular;  Laterality: Right;  SFA   POSTERIOR FUSION CERVICAL SPINE  2012   PPM GENERATOR REMOVAL N/A 12/14/2020   Procedure: PPM GENERATOR REMOVAL;  Surgeon: Waddell Danelle LELON, MD;  Location: [facility];  Service: Cardiovascular;  Laterality: N/A;   TEE WITHOUT CARDIOVERSION N/A 12/11/2020   Procedure: TRANSESOPHAGEAL ECHOCARDIOGRAM (TEE);  Surgeon: Barbaraann Darryle Debby, MD;  Location: [facility] ENDOSCOPY;  Service: Cardiovascular;  Laterality: N/A;   TRIGGER FINGER RELEASE  2011   4th finger left hand   VIDEO ASSISTED THORACOSCOPY Left 12/26/2016   Procedure: VIDEO ASSISTED THORACOSCOPY;  Surgeon: Elspeth JAYSON Millers, MD;  Location: [facility] OR;  Service: Thoracic;  Laterality: Left;   VIDEO BRONCHOSCOPY N/A 12/26/2016   Procedure: VIDEO BRONCHOSCOPY;  Surgeon: Elspeth JAYSON Millers, MD;  Location: [facility] OR;  Service: Thoracic;  Laterality: N/A;   Social History   Occupational History   Not on file  Tobacco Use   Smoking status: Never    Passive exposure: Past   Smokeless tobacco: Never  Vaping Use   Vaping status: Never Used  Substance and  Sexual Activity   Alcohol use: No   Drug use: No   Sexual activity: Not Currently

## 2024-09-25 ENCOUNTER — Telehealth: Payer: Self-pay

## 2024-09-25 NOTE — Telephone Encounter (Signed)
 Copied from CRM [id number]. Topic: Appointments - Scheduling Inquiry for Clinic >> Sep 24, 2024  3:13 PM Leila C wrote: Reason for CRM: Patient's wife Avelina [phone] states patient has an appointment with cardiologist at [facility] the same day as 10/01/24 at 2:15 pm with Dr. Geronimo appointment. Avelina needs to reschedule and wants the appointment in November. Informed Avelina, next appointment is 12/13/24. Avelina asked for patient to be seen sooner, send a message to Dr. Geronimo if patient can be squeezed in?  Avelina states cannot hear me and disconnected the call.   I called Avelina back, informed Avelina will send the message and the office will call patient back. Please advise and call back.  Called patient wife and got him rescheduled.NFN

## 2024-10-01 ENCOUNTER — Ambulatory Visit: Admitting: Internal Medicine

## 2024-10-07 ENCOUNTER — Encounter: Payer: Self-pay | Admitting: Radiology

## 2024-10-09 ENCOUNTER — Ambulatory Visit: Admitting: Internal Medicine

## 2024-10-09 ENCOUNTER — Encounter: Payer: Self-pay | Admitting: Internal Medicine

## 2024-10-09 VITALS — BP 106/65 | HR 82 | Ht 72.0 in | Wt 232.0 lb

## 2024-10-09 DIAGNOSIS — J84112 Idiopathic pulmonary fibrosis: Secondary | ICD-10-CM | POA: Diagnosis not present

## 2024-10-09 NOTE — Progress Notes (Signed)
 OV 06/12/2024  Subjective:  Patient ID: Shane [name], male , DOB: Jan 05, 1941 , age 83 y.o. , MRN: [id number] , ADDRESS: [address] [place] [address] PCP Shepard Ade, MD Patient Care Team: Shepard Ade, MD as PCP - General (Internal Medicine) Shepard Ade, MD as PCP - Internal Medicine (Internal Medicine) Pietro Redell RAMAN, MD as PCP - Cardiology (Cardiology) Waddell Danelle ORN, MD as PCP - Electrophysiology (Cardiology) Caleen Dirks, MD as Consulting Physician (Internal Medicine)  This Provider for this visit: Treatment Team:  Attending Provider: Geronimo Amel, MD    06/12/2024 -   Chief Complaint  Patient presents with   Follow-up    Breathing has improved since last visit. He has very min, dry cough. Review HRCT done 05/31/24.    #Idiopathic pulmonary fibrosis presents for follow-up.  He has multiple medical problems.  And this includes COVID early in the pandemic and a repeat COVID in 2025 January that was complicated by retroperitoneal bleed and requiring CRRT.  May 2025 pulmonary embolism.  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  He also has poorly controlled diabetes.  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  He has very mild baseline anemia    HPI Shane [name] 83 y.o. -last visit was a month ago.  At the time he was complaining of increased shortness of breath since his COVID earlier this year.  He was also complaining of cough.  Noticed that his ejection fraction had dropped to 35-40%.  1 year ago it was 50%.  Today he tells me that his shortness of breath is improved [see symptom score below] but is still there.  Is only minimally improved.  However his cough is completely resolved.  So we did a high-resolution CT chest that I personally visualized.  There is  no change or progression this in the last 1 year.  In the interim he did purchase out-of-pocket oxygen concentrator and portable oxygen from [employer].  This is only $300.  It is really helping him with exertion.     OV 10/09/2024  Subjective:  Patient ID: Shane [name], male , DOB: 1941-09-28 , age 33 y.o. , MRN: [id number] , ADDRESS: [address] [place] [address] PCP Shepard Ade, MD Patient Care Team: Shepard Ade, MD as PCP - General (Internal Medicine) Shepard Ade, MD as PCP - Internal Medicine (Internal Medicine) Pietro Redell RAMAN, MD as PCP - Cardiology (Cardiology) Waddell Danelle ORN, MD as PCP - Electrophysiology (Cardiology) Caleen Dirks, MD as Consulting Physician (Internal Medicine)  This Provider for this visit: Treatment Team:  Attending Provider: Geronimo Amel, MD    10/09/2024 -   Chief Complaint  Patient presents with   Medical Management of Chronic Issues   Interstitial Lung Disease    Breathing has been stable, maybe a little better. No new concerns.     IPF followup. On suporitive care  HPI Shane [name] 83 y.o. -Shane [name] is an 83 year old male who presents for follow-up for IPF. Wife indendpent ihstorial    He was hospitalized in August for a bowel obstruction and a severe allergic reaction characterized by hives. The cause of the allergic reaction is unknown and not linked to any known allergens such as fish or walnuts. He was discharged with an EpiPen  for emergency use.  During the hospitalization, a CT scan ruled out a blood clot, but scar tissue was noted, which remains stable.  He reports improvement in his breathing and is not using supplemental oxygen during the day, although he continues to use CPAP at night. He has a history of pulmonary fibrosis. He reports improvement in his breathing and is not having to struggle with it as much.  He is experiencing issues with his foot, which has three open wounds.  One wound is nearly healed, another is improving, but a new wound on his heel has developed from a skin tear and has worsened. He is under the care of Doctor Harden, who is managing the wound care. He has not undergone hyperbaric oxygen therapy for his foot wounds.  He has a history of fibrosis and gait issues, and he mentions a long-standing struggle with diabetes. He received a flu shot and reports no significant new symptoms.    SYMPTOM SCALE - ILD 05/09/2024 06/12/2024 Using out-of-pocket purchased oxygen for exertion and at night  Current weight    O2 use Using only at night   Shortness of Breath 0 -> 5 scale with 5 being worst (score 6 If unable to do)   At rest 1 0  Simple tasks - showers, clothes change, eating, shaving 2 3  Household (dishes, doing bed, laundry) 3 2  Shopping 1 2  Walking level at own pace 5 4  Walking up Stairs 5 4  Total (30-36) Dyspnea Score 17 15  How bad is your cough? 4 1  How bad is your fatigue 5 4  How bad is nausea 0 0  How bad is vomiting?  0 0  How bad is diarrhea? 0 0  How bad is anxiety? 1 1  How bad is depression 1 1  Any chronic pain - if so where and how bad 0 0   CT Chest data from date: 07/23/24  - personally visualized and independently interpreted : yes - my findings are: agree   IMPRESSION: 1. No CT evidence of pulmonary artery embolus. 2. Interstitial lung disease. 3. Small left pleural effusion. 4.  Aortic Atherosclerosis (ICD10-I70.0).     Electronically Signed   By: Vanetta Chou M.D.   On: 07/23/2024 19:31   PFT     Latest Ref Rng & Units 09/23/2022   12:29 PM 09/21/2018   12:08 PM 05/23/2018   10:37 AM 12/25/2017    9:01 AM 10/04/2017    9:35 AM 04/06/2017   10:13 AM 10/14/2016    8:19 AM  PFT Results  FVC-Pre L 2.27   2.89  P 2.88  2.94  2.94  3.00   FVC-Predicted Pre % 52   61  P 61  62  62  63   FVC-Post L       3.00   FVC-Predicted Post %       63   Pre FEV1/FVC % % 89   91  P 86  92  91  90   Post FEV1/FCV  % %       92   FEV1-Pre L 2.01   2.62  P 2.47  2.69  2.69  2.70   FEV1-Predicted Pre % 65   77  P 72  79  78  78   FEV1-Post L       2.75   DLCO uncorrected ml/min/mmHg 11.84  14.95  17.13  P 14.56  15.04  16.43  16.82   DLCO UNC% %  45  42  47  P 40  41  45  46   DLCO corrected ml/min/mmHg 11.84  15.26  17.33  P 15.57  15.31  19.35    DLCO COR %Predicted % 45  43  47  P 42  42  53    DLVA Predicted % 97  83  91  P 83  79  99  81   TLC L       4.62   TLC % Predicted %       60   RV % Predicted %       60     P Preliminary result       LAB RESULTS last 96 hours No results found.       has a past medical history of Acute blood loss anemia, Anxiety, AV block, Mobitz 1, Cataract, Chronic kidney disease, CKD (chronic kidney disease), stage III (HCC), COVID-19 virus infection (07/18/2020), Depression, Diabetes mellitus, Dyspnea, GERD (gastroesophageal reflux disease), History of kidney stones, Hyperlipidemia, Hypertension, Idiopathic pulmonary fibrosis (HCC) (11/2016), ILD (interstitial lung disease) (HCC), Moderate aortic stenosis, Neuromuscular disorder (HCC), Neuropathy associated with endocrine disorder, Nonobstructive CAD (coronary artery disease), OSA on CPAP (05/05/2017), PONV (postoperative nausea and vomiting), Postoperative anemia due to acute blood loss (11/07/2020), Postoperative hemorrhagic shock (03/20/2021), and Sleep apnea.   reports that he has never smoked. He has been exposed to tobacco smoke. He has never used smokeless tobacco.  Past Surgical History:  Procedure Laterality Date   ABDOMINAL AORTOGRAM W/LOWER EXTREMITY N/A 12/10/2020   Procedure: ABDOMINAL AORTOGRAM W/LOWER EXTREMITY;  Surgeon: Magda Debby SAILOR, MD;  Location: [facility] INVASIVE CV LAB;  Service: Cardiovascular;  Laterality: N/A;   AMPUTATION Right 01/22/2021   Procedure: 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;  Surgeon: Harden Jerona GAILS, MD;  Location: [facility] OR;  Service: Orthopedics;  Laterality: Right;   AMPUTATION Right  03/17/2021   Procedure: RIGHT BELOW KNEE AMPUTATION;  Surgeon: Harden Jerona GAILS, MD;  Location: [facility] OR;  Service: Orthopedics;  Laterality: Right;   ANKLE FUSION Right 01/22/2021   Procedure: RIGHT FOOT TIBIOCALCANEAL FUSION;  Surgeon: Harden Jerona GAILS, MD;  Location: [facility] OR;  Service: Orthopedics;  Laterality: Right;   ANTERIOR FUSION CERVICAL SPINE  2012   CARDIAC CATHETERIZATION  2011   CARDIAC CATHETERIZATION N/A 11/09/2016   Procedure: Right Heart Cath;  Surgeon: Victory LELON Sharps, MD;  Location: [facility] INVASIVE CV LAB;  Service: Cardiovascular;  Laterality: N/A;   carpel tunnel     left wrist   CATARACT EXTRACTION     CATARACT EXTRACTION W/ INTRAOCULAR LENS  IMPLANT, BILATERAL  2013   CERVICAL LAMINECTOMY  2012   COLONOSCOPY N/A 01/14/2013   Procedure: COLONOSCOPY;  Surgeon: Norleen SAILOR Kiang, MD;  Location: [facility] ENDOSCOPY;  Service: Endoscopy;  Laterality: N/A;   CORONARY ARTERY BYPASS GRAFT  11/04/2020   LIMA-LAD, SVG-OM1, SVG-PDA (Dr Norleen [facility]on [facility]) dc 11/18/2020   EYE SURGERY     I & D EXTREMITY Right 02/19/2021   Procedure: RIGHT ANKLE DEBRIDEMENT AND PLACEMENT ANTIBIOTIC BEADS;  Surgeon: Harden Jerona GAILS, MD;  Location: [facility] OR;  Service: Orthopedics;  Laterality: Right;   KNEE SURGERY  1998   left   LEFT HEART CATH AND CORONARY ANGIOGRAPHY N/A 07/10/2020   Procedure: LEFT HEART CATH AND CORONARY ANGIOGRAPHY;  Surgeon: Wonda Sharper, MD;  Location: [facility] INVASIVE CV LAB;  Service: Cardiovascular;  Laterality: N/A;   LUMBAR LAMINECTOMY  2003   LUNG BIOPSY Left 12/26/2016   Procedure: LUNG BIOPSY;  Surgeon: Elspeth JAYSON Millers, MD;  Location: [facility] OR;  Service: Thoracic;  Laterality: Left;   PACEMAKER IMPLANT N/A 03/30/2020   Procedure: PACEMAKER IMPLANT;  Surgeon: Waddell Danelle ORN, MD;  Location: [facility] INVASIVE CV LAB;  Service: Cardiovascular;  Laterality: N/A;   PERIPHERAL VASCULAR INTERVENTION Right 12/10/2020   Procedure: PERIPHERAL VASCULAR INTERVENTION;  Surgeon: Magda Debby SAILOR, MD;  Location: [facility] INVASIVE CV  LAB;  Service: Cardiovascular;  Laterality: Right;  SFA   POSTERIOR FUSION CERVICAL SPINE  2012   PPM GENERATOR REMOVAL N/A 12/14/2020   Procedure: PPM GENERATOR REMOVAL;  Surgeon: Waddell Danelle ORN, MD;  Location: [facility] INVASIVE CV LAB;  Service: Cardiovascular;  Laterality: N/A;   TEE WITHOUT CARDIOVERSION N/A 12/11/2020   Procedure: TRANSESOPHAGEAL ECHOCARDIOGRAM (TEE);  Surgeon: Barbaraann Darryle Debby, MD;  Location: [facility] ENDOSCOPY;  Service: Cardiovascular;  Laterality: N/A;   TRIGGER FINGER RELEASE  2011   4th finger left hand   VIDEO ASSISTED THORACOSCOPY Left 12/26/2016   Procedure: VIDEO ASSISTED THORACOSCOPY;  Surgeon: Elspeth JAYSON Millers, MD;  Location: [facility] OR;  Service: Thoracic;  Laterality: Left;   VIDEO BRONCHOSCOPY N/A 12/26/2016   Procedure: VIDEO BRONCHOSCOPY;  Surgeon: Elspeth JAYSON Millers, MD;  Location: [facility] OR;  Service: Thoracic;  Laterality: N/A;    Allergies  Allergen Reactions   Codeine Hives and Itching   Ofev  [Nintedanib ] Diarrhea    SEVERE DIARRHEA   Pirfenidone  Diarrhea and Other (See Comments)    Esbriet  (Pirfenidone ) causes elevated LFTs. D/C on 06/14/17 and SEVERE DIARRHEA    Amoxicillin Itching    Immunization History  Administered Date(s) Administered   Fluad Quad(high Dose 65+) 09/27/2024   Fluzone Influenza virus vaccine,trivalent (IIV3), split virus 10/07/2010, 08/31/2011, 10/12/2012, 12/05/2012, 08/29/2013, 10/31/2014   INFLUENZA, HIGH DOSE SEASONAL PF 08/05/2016, 09/01/2016, 09/02/2019, 09/05/2022   Influenza Split 08/14/2017   Influenza, Mdck, Trivalent,PF 6+ MOS(egg free) 08/23/2017   Influenza, Quadrivalent, Recombinant, Inj, Pf 08/29/2018, 08/23/2019, 09/19/2020, 08/28/2023   Influenza,inj,Quad PF,6+ Mos 10/31/2014, 08/29/2015   Influenza-Unspecified 10/13/2020   PFIZER(Purple Top)SARS-COV-2 Vaccination 12/28/2019, 01/18/2020, 09/27/2020   Pneumococcal Conjugate-13 01/14/2015   Pneumococcal Polysaccharide-23 11/26/2009, 12/05/2014   Td (Adult)  12/13/2013   Td (Adult),5 Lf Tetanus Toxid, Preservative Free 10/07/2010   Zoster, Live 01/14/2011    Family History  Problem Relation Age of Onset   Diabetes Mellitus II Mother    Emphysema Father 48   Heart attack Father    Colon cancer Neg Hx    Esophageal cancer Neg Hx    Rectal cancer Neg Hx    Stomach cancer Neg Hx      Current Outpatient Medications:    Acetaminophen  (TYLENOL  PO), Take 2 tablets by mouth as needed. May take 2 tablet by mouth 3 hours after taking the trazodone  75mg  prn., Disp: , Rfl:    albuterol  (PROVENTIL ) (2.5 MG/3ML) 0.083% nebulizer solution, Take 2.5 mg by nebulization 2 (two) times daily as needed for wheezing or shortness of breath., Disp: , Rfl:    albuterol  (VENTOLIN  HFA) 108 (90 Base) MCG/ACT inhaler, Inhale 2 puffs into the lungs every 4 (four) hours as needed for wheezing or shortness of breath., Disp: 6.7 g, Rfl: 0   apixaban  (ELIQUIS ) 5 MG TABS tablet, Take 5 mg by mouth 2 (two) times daily., Disp: , Rfl:    Ascorbic Acid  (VITAMIN C  WITH ROSE HIPS) 500 MG tablet, Take 1 tablet (500 mg total) by mouth daily., Disp: 30 tablet, Rfl: 0   atorvastatin  (LIPITOR ) 80 MG tablet, TAKE 1 TABLET BY MOUTH  EVERY DAY, Disp: 90 tablet, Rfl: 3   benzonatate  (TESSALON ) 100 MG capsule, Take 1 capsule (100 mg total) by mouth 3 (three) times daily., Disp: 20 capsule, Rfl: 0   cyanocobalamin  1000 MCG tablet, Take 1 tablet (1,000 mcg total) by mouth daily., Disp: 30 tablet, Rfl: 3   dextromethorphan  (DELSYM ) 30 MG/5ML liquid, Take 2.5 mLs (15 mg total) by mouth 2 (two) times daily., Disp: 89 mL, Rfl: 0   docusate sodium  (COLACE) 100 MG capsule, Take 1 capsule (100 mg total) by mouth 2 (two) times daily., Disp: 10 capsule, Rfl: 0   EPINEPHrine  0.3 mg/0.3 mL IJ SOAJ injection, Inject 0.3 mg into the muscle as needed for anaphylaxis., Disp: 1 each, Rfl: 1   escitalopram  (LEXAPRO ) 10 MG tablet, Take 1 tablet (10 mg total) by mouth daily., Disp: 30 tablet, Rfl: 0   furosemide   (LASIX ) 40 MG tablet, Take 1 tablet (40 mg total) by mouth 2 (two) times daily., Disp: , Rfl:    gabapentin  (NEURONTIN ) 100 MG capsule, Take 1 capsule (100 mg total) by mouth 3 (three) times daily. (Patient taking differently: Take 100 mg by mouth 2 (two) times daily.), Disp: 90 capsule, Rfl: 0   HUMALOG  KWIKPEN 100 UNIT/ML KwikPen, Inject 10-15 Units into the skin 3 (three) times daily. Can take 15-18 Units with meals while on Prednisone  for 3days, Disp: , Rfl:    HYDROcodone  bit-homatropine (HYCODAN) 5-1.5 MG/5ML syrup, Take 5 mLs by mouth every 6 (six) hours as needed for cough., Disp: 120 mL, Rfl: 0   HYDROcodone -acetaminophen  (NORCO/VICODIN) 5-325 MG tablet, Take 1 tablet by mouth at bedtime as needed., Disp: , Rfl:    hydrOXYzine  (ATARAX ) 25 MG tablet, Take 25 mg by mouth at bedtime., Disp: , Rfl:    lisinopril  (ZESTRIL ) 10 MG tablet, Take 10 mg by mouth daily., Disp: , Rfl:    LORazepam  (ATIVAN ) 1 MG tablet, Take 1 tablet (1 mg total) by mouth at bedtime as needed for sleep., Disp: 3 tablet, Rfl: 0   Magnesium  200 MG TABS, 1 tablet at bedtime Orally Once a day, Disp: , Rfl:    methocarbamol  (ROBAXIN ) 500 MG tablet, Take 1 tablet (500 mg total) by mouth every 8 (eight) hours as needed for muscle spasms., Disp: 30 tablet, Rfl: 0   Multiple Vitamin (MULTIVITAMIN WITH MINERALS) TABS tablet, Take 1 tablet by mouth in the morning., Disp: , Rfl:    mupirocin  ointment (BACTROBAN ) 2 %, Apply 1 Application topically at bedtime., Disp: , Rfl:    Nutritional Supplements (FEEDING SUPPLEMENT, NEPRO CARB STEADY,) LIQD, Take 237 mLs by mouth 2 (two) times daily between meals., Disp: , Rfl:    Olopatadine HCl (PATADAY OP), Place 2 drops into both eyes daily as needed (allergies)., Disp: , Rfl:    ondansetron  (ZOFRAN ) 4 MG tablet, Take 4 mg by mouth as needed for nausea or vomiting., Disp: , Rfl:    OXYGEN, Inhale 3 L into the lungs See admin instructions. Inhale 3L of Oxygen everynight at bedside. May uses  throughout the day as needed., Disp: , Rfl:    pantoprazole  (PROTONIX ) 40 MG tablet, Take 1 tablet (40 mg total) by mouth 2 (two) times daily., Disp: 60 tablet, Rfl: 0   polyethylene glycol powder (GLYCOLAX /MIRALAX ) 17 GM/SCOOP powder, Take 17 g by mouth as needed for mild constipation or moderate constipation., Disp: , Rfl:    traZODone  (DESYREL ) 50 MG tablet, Take 75 mg by mouth at bedtime as needed for sleep., Disp: , Rfl:  TRESIBA  FLEXTOUCH 100 UNIT/ML FlexTouch Pen, Inject 25 Units into the skin in the morning., Disp: , Rfl:    zinc  sulfate 220 (50 Zn) MG capsule, Take 1 capsule (220 mg total) by mouth daily., Disp: 30 capsule, Rfl: 0      Objective:   Vitals:   10/09/24 1511  BP: 106/65  Pulse: 82  SpO2: 96%  Weight: 232 lb (105.2 kg)  Height: 6' (1.829 m)    Estimated body mass index is 31.46 kg/m as calculated from the following:   Height as of this encounter: 6' (1.829 m).   Weight as of this encounter: 232 lb (105.2 kg).  @WEIGHTCHANGE @  [employer]   10/09/24 1511  Weight: 232 lb (105.2 kg)     Physical Exam   General: No distress. O2 at rest: no Cane present: no Sitting in wheel chair: YES Frail: no Obese: no Neuro: Alert and Oriented x 3. GCS 15. Speech normal Psych: Pleasant Resp:  Barrel Chest - no.  Wheeze - no, Crackles - fine at base, No overt respiratory distress CVS: Normal heart sounds. Murmurs - no Ext: Stigmata of Connective Tissue Disease - no but left foot amputee and in sox HEENT: Normal upper airway. PEERL +. No post nasal drip        Assessment/     Assessment & Plan 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HCC)    PLAN Patient Instructions   #Pulmonary fibrosis  - Clinicaly stable  PLAN - -Continue oxygen at night wit CPAP - Continue to monitor pulmonary fibrosis and consider antifibrotic's if there is any change in fibrosis  - right now holding off anti fibrotic due to stability of ILD  Followup -6 months 15-minute  visit follow-up with Dr Geronimo BOSCH    Return for -6 months 15-minute visit follow-up with Dr Geronimo.    SIGNATURE    Dr. Dorethia Geronimo, M.D., F.C.C.P,  Pulmonary and Critical Care Medicine Staff Physician, [facility] Center Director - Interstitial Lung Disease  Program  Pulmonary Fibrosis [facility] at [facility] [place], [place], [address]  Pager: [phone], If no answer or between  15:00h - 7:00h: call [phone] Telephone: [phone]  3:37 PM 10/09/2024   Moderate Complexity MDM OFFICE  2021 E/M guidelines, first released in 2021, with minor revisions added in 2023 and 2024 Must meet the requirements for 2 out of 3 dimensions to qualify.    Number and complexity of problems addressed Amount and/or complexity of data reviewed Risk of complications and/or morbidity  One or more chronic illness with mild exacerbation, OR progression, OR  side effects of treatment  Two or more stable chronic illnesses  One undiagnosed new problem with uncertain prognosis  One acute illness with systemic symptoms   One Acute complicated injury Must meet the requirements for 1 of 3 of the categories)  Category 1: Tests and documents, historian  Any combination of 3 of the following:  Assessment requiring an independent historian  Review of prior external note(s) from each unique source  Review of results of each unique test  Ordering of each unique test    Category 2: Interpretation of tests   Independent interpretation of a test performed by another physician/other qualified health care professional (not separately reported)  Category 3: Discuss management/tests  Discussion of management or test interpretation with external physician/other qualified health care professional/appropriate source (not separately reported) Moderate risk of morbidity from additional diagnostic  testing or treatment Examples  only:  Prescription drug management  Decision regarding minor surgery with identfied patient or procedure risk factors  Decision regarding elective major surgery without identified patient or procedure risk factors  Diagnosis or treatment significantly limited by social determinants of health             HIGh Complexity  OFFICE   2021 E/M guidelines, first released in 2021, with minor revisions added in 2023. Must meet the requirements for 2 out of 3 dimensions to qualify.    Number and complexity of problems addressed Amount and/or complexity of data reviewed Risk of complications and/or morbidity  Severe exacerbation of chronic illness  Acute or chronic illnesses that may pose a threat to life or bodily function, e.g., multiple trauma, acute MI, pulmonary embolus, severe respiratory distress, progressive rheumatoid arthritis, psychiatric illness with potential threat to self or others, peritonitis, acute renal failure, abrupt change in neurological status Must meet the requirements for 2 of 3 of the categories)  Category 1: Tests and documents, historian  Any combination of 3 of the following:  Assessment requiring an independent historian  Review of prior external note(s) from each unique source  Review of results of each unique test  Ordering of each unique test    Category 2: Interpretation of tests    Independent interpretation of a test performed by another physician/other qualified health care professional (not separately reported)  Category 3: Discuss management/tests  Discussion of management or test interpretation with external physician/other qualified health care professional/appropriate source (not separately reported)  HIGH risk of morbidity from additional diagnostic testing or treatment Examples only:  Drug therapy requiring intensive monitoring for toxicity  Decision for elective major surgery with identified pateint or procedure risk  factors  Decision regarding hospitalization or escalation of level of care  Decision for DNR or to de-escalate care   Parenteral controlled  substances            LEGEND - Independent interpretation involves the interpretation of a test for which there is a CPT code, and an interpretation or report is customary. When a review and interpretation of a test is performed and documented by the provider, but not separately reported (billed), then this would represent an independent interpretation. This report does not need to conform to the usual standards of a complete report of the test. This does not include interpretation of tests that do not have formal reports such as a complete blood count with differential and blood cultures. Examples would include reviewing a chest radiograph and documenting in the medical record an interpretation, but not separately reporting (billing) the interpretation of the chest radiograph.   An appropriate source includes professionals who are not health care professionals but may be involved in the management of the patient, such as a clinical research associate, upper officer, case manager or teacher, and does not include discussion with family or informal caregivers.    - SDOH: SDOH are the conditions in the environments where people are born, live, learn, work, play, worship, and age that affect a wide range of health, functioning, and quality-of-life outcomes and risks. (e.g., housing, food insecurity, transportation, etc.). SDOH-related Z codes ranging from Z55-Z65 are the ICD-10-CM diagnosis codes used to document SDOH data Z55 - Problems related to education and literacy Z56 - Problems related to employment and unemployment Z57 - Occupational exposure to risk factors Z58 - Problems related to physical environment Z59 - Problems related to housing and economic circumstances [phone] - Problems related  to social environment [phone] - Problems related to upbringing [phone] - Other  problems related to primary support group, including family circumstances Z52 - Problems related to certain psychosocial circumstances Z65 - Problems related to other psychosocial circumstances

## 2024-10-09 NOTE — Patient Instructions (Addendum)
#  Pulmonary fibrosis  - Clinicaly stable  PLAN - -Continue oxygen at night wit CPAP - Continue to monitor pulmonary fibrosis and consider antifibrotic's if there is any change in fibrosis  - right now holding off anti fibrotic due to stability of ILD  Followup -6 months 15-minute visit follow-up with Dr Geronimo

## 2024-10-14 ENCOUNTER — Ambulatory Visit: Admitting: Orthopedic Surgery

## 2024-10-14 ENCOUNTER — Encounter: Payer: Self-pay | Admitting: Orthopedic Surgery

## 2024-10-14 DIAGNOSIS — L97521 Non-pressure chronic ulcer of other part of left foot limited to breakdown of skin: Secondary | ICD-10-CM

## 2024-10-14 DIAGNOSIS — Z89511 Acquired absence of right leg below knee: Secondary | ICD-10-CM | POA: Diagnosis not present

## 2024-10-14 DIAGNOSIS — Z89439 Acquired absence of unspecified foot: Secondary | ICD-10-CM | POA: Diagnosis not present

## 2024-10-14 DIAGNOSIS — I739 Peripheral vascular disease, unspecified: Secondary | ICD-10-CM | POA: Diagnosis not present

## 2024-10-14 NOTE — Progress Notes (Signed)
 Office Visit Note   Patient: Shane [name]           Date of Birth: [birth year]           MRN: [id number] Visit Date: 10/14/2024              Requested by: Shepard Ade, MD [address] [place],  [place] [address] PCP: Shepard Ade, MD  Chief Complaint  Patient presents with   Left Foot - Wound Check    Hx TMA      HPI: Discussed the use of AI scribe software for clinical note transcription with the patient, who gave verbal consent to proceed.  History of Present Illness Shane [name] Shane [name] is an 83 year old male with ischemic ulcers who presents with progressive painful ulceration of the left foot.  He has a progressive ischemic painful ulcer over the lateral aspect of his left heel, measuring two by four centimeters. The pain is increasing, and the ulceration has been persistent, with periods of improvement followed by worsening. He manages the condition by keeping weight off the affected area, elevating his leg above his heart twice a day, and using the side of his foot to get in and out of the car.  He has a history of a weak heart and is concerned about undergoing another surgery due to a previous incident where his heart stopped during a heart valve procedure. The ulceration has been a concern for two years, and he has been trying to manage it with conservative measures such as washing, drying, and dressing the wound, and keeping weight off it. He believes that even if the ulcer heals, it will likely recur due to the underlying microvascular disease.     Assessment & Plan: Visit Diagnoses: No diagnosis found.  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 Ulcer measures 2x4 cm with ischemic tissue and increasing pain. Lateral transmetatarsal amputation  site is 2x2 cm with healthy granulation tissue. No exposed bone, cellulitis, odor, or drainage. Condition not infected or life-threatening but has not healed on its own. - Continue current wound care regimen: sipping water, washing, drying, and dressing the wound. - Keep weight off the affected foot. - Elevate the foot above heart level twice daily. - Monitor for signs of infection or increased pain and seek emergency care if these occur.  Planned left transtibial amputation for non-healing ischemic ulcers Planned left transtibial amputation due to non-healing ischemic ulcers. He has a weak heart and is at increased risk for surgery. Previous cardiac event during surgery raises concerns about his ability to undergo another procedure. He is willing to proceed with surgery after Thanksgiving and Christmas, understanding the risks and benefits. - Will schedule left transtibial amputation after Christmas. - Will use block plus MAC anesthetic for the procedure. - Will coordinate with [facility] to set up the surgery at his convenience.      Follow-Up Instructions: No follow-ups on file.   Ortho Exam  Patient is alert, oriented, no adenopathy, well-dressed, normal affect, normal respiratory effort. Physical Exam EXTREMITIES: Right transtibular amputation of the left foot. Progressive ischemic painful ulcer over lateral left heel, measuring 2x4 cm. Lateral transmetatarsal amputation 2x2 cm with healthy granulation tissue. No exposed bone, cellulitis, odor, or drainage. Good biphasic pulse in foot arteries.      Imaging:  No results found. No images are attached to the encounter.  Labs: Lab Results  Component Value Date   HGBA1C 8.7 (H) 04/09/2024   HGBA1C 8.5 (H) 12/06/2023   HGBA1C 9.0 (H) 06/24/2023   ESRSEDRATE 84 (H) 12/29/2023   ESRSEDRATE 22 (H) 10/14/2016   CRP 3.5 (H) 12/29/2023   CRP 9.0 (H) 12/08/2023   CRP 8.3 (H) 12/09/2020   REPTSTATUS 07/28/2024 FINAL 07/23/2024    GRAMSTAIN  02/19/2021    RARE WBC PRESENT, PREDOMINANTLY PMN NO ORGANISMS SEEN    CULT  07/23/2024    NO GROWTH 5 DAYS Performed at [facility], [address] [place], [place] [address]    [facility] ESCHERICHIA COLI 02/19/2021     Lab Results  Component Value Date   ALBUMIN  2.6 (L) 07/25/2024   ALBUMIN  3.1 (L) 07/23/2024   ALBUMIN  2.9 (L) 04/11/2024    Lab Results  Component Value Date   MG 2.0 07/24/2024   MG 1.5 (L) 07/23/2024   MG 1.6 (L) 04/09/2024   Lab Results  Component Value Date   VD25OH 32.29 12/14/2022    No results found for: PREALBUMIN    Latest Ref Rng & Units 07/26/2024    6:21 AM 07/24/2024   10:36 AM 07/24/2024   12:19 AM  CBC EXTENDED  WBC 4.0 - 10.5 K/uL 12.7  12.0    RBC 4.22 - 5.81 MIL/uL 3.60  3.90    Hemoglobin 13.0 - 17.0 g/dL 88.8  88.0  88.0   HCT 39.0 - 52.0 % 34.6  36.8  35.0   Platelets 150 - 400 K/uL 152  170       There is no height or weight on file to calculate BMI.  Orders:  No orders of the defined types were placed in this encounter.  No orders of the defined types were placed in this encounter.    Procedures: No procedures performed  Clinical Data: No additional findings.  ROS:  All other systems negative, except as noted in the HPI. Review of Systems  Objective: Vital Signs: There were no vitals taken for this visit.  Specialty Comments:  No specialty comments available.  PMFS History: Patient Active Problem List   Diagnosis Date Noted   Elevated troponin I level 07/25/2024   History of pulmonary embolus (PE) 07/24/2024   Anaphylactoid reaction 07/24/2024   SBO (small bowel obstruction) (HCC) 07/23/2024   Acute kidney injury superimposed on stage 3a chronic kidney disease (HCC) 04/09/2024   Impaired functional mobility, balance, gait, and endurance 04/09/2024   Non-pressure chronic ulcer of other part of left foot limited to breakdown of skin (HCC) 12/27/2023   Type 2 diabetes mellitus without  complication, with long-term current use of insulin  (HCC) 12/12/2023   Esophageal dysphagia 12/11/2023   COVID-19 12/11/2023   Acute on chronic hypoxic respiratory failure (HCC) 12/10/2023   Anemia 12/10/2023   Non-ST elevation (NSTEMI) myocardial infarction (HCC) 12/09/2023   Retroperitoneal bleeding 12/09/2023   History of transcatheter aortic valve replacement (TAVR) 12/09/2023   Chronic heart failure with preserved ejection fraction (HFpEF) (HCC) 12/09/2023   Hx of CABG 12/09/2023   Atherosclerosis of native coronary artery of native heart without angina pectoris 12/09/2023   Hyperkalemia 12/07/2023   Acute on chronic congestive heart failure (HCC) 12/07/2023   Acute kidney injury (nontraumatic) 12/06/2023   Acute on chronic combined systolic and diastolic CHF, NYHA class 1 (HCC) 06/22/2023   Acute on chronic systolic (congestive) heart failure (HCC) 06/21/2023   Intermediate stage  nonexudative age-related macular degeneration of both eyes 04/25/2022   Research study patient 12/24/2021   Labile blood glucose    Pulmonary fibrosis (HCC)    Hyponatremia    S/P BKA (below knee amputation) unilateral, right (HCC)    Shortness of breath    Stage 3 chronic kidney disease (HCC)    PAF (paroxysmal atrial fibrillation) (HCC)    Postoperative pain    Atherosclerosis of native arteries of extremities with gangrene, right leg (HCC)    Hardware complicating wound infection    Abscess of bursa of right ankle 02/15/2021   Endocarditis 01/19/2021   PAD (peripheral artery disease) 12/29/2020   Chronic systolic CHF (congestive heart failure) (HCC) 12/22/2020   History of COVID-19 12/22/2020   SSS (sick sinus syndrome) (HCC) 12/22/2020   Acute bacterial endocarditis    MSSA bacteremia 12/09/2020   Diabetic foot ulcer (HCC) 12/09/2020   Foot drop, right foot    Generalized weakness 12/08/2020   Atrial fibrillation, chronic (HCC) 12/08/2020   NSTEMI (non-ST elevated myocardial infarction)  (HCC) 12/08/2020   Adrenal insufficiency 11/14/2020   Orthostatic hypotension 11/14/2020   Physical deconditioning 11/14/2020   Difficulty sleeping 11/07/2020   LV dysfunction 11/05/2020   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/04/2020   Hearing loss 11/03/2020   Cardiac device in situ 09/09/2020   COVID-19 virus infection 07/18/2020   Coronary artery disease involving native heart without angina pectoris/ S/P CABG x3 07/10/2020   Chronic kidney disease, stage 3a (HCC) 03/29/2020   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/28/2020   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) 03/16/2020   Right epiretinal membrane 03/16/2020   Posterior vitreous detachment of right eye 03/16/2020   Aortic stenosis, moderate 03/12/2020   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   Near syncope 04/27/2018   Hypoglycemia due to insulin  01/06/2018   Essential hypertension 01/06/2018   Syncope and collapse 01/05/2018   Encounter for therapeutic drug monitoring 06/29/2017   OSA on CPAP 05/05/2017   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HCC) 01/05/2017   Abnormal chest x-ray 10/12/2016   Benign neoplasm of colon 01/14/2013   Gastro-esophageal reflux disease without esophagitis 11/24/2009   Past Medical History:  Diagnosis Date   Acute blood loss anemia    Anxiety    AV block, Mobitz 1    Cataract    Chronic kidney disease    d/t DM   CKD (chronic kidney disease), stage III (HCC)    COVID-19 virus infection 07/18/2020   Last Assessment & Plan:   Formatting of this note might be different from the original.  Immunocompromise high risk patient  -ID consulted for further therapies and will administer regeneron as OP tomorrow   -Decadron  discontinued as patient is off O2     Depression    Diabetes mellitus    Vgo disposal insulin  bolus  simular to insulin  pump   Dyspnea    GERD (gastroesophageal reflux disease)    History of kidney stones    passed   Hyperlipidemia     Hypertension    Idiopathic pulmonary fibrosis (HCC) 11/2016   ILD (interstitial lung disease) (HCC)    Moderate aortic stenosis    a. 10/2019 Echo: EF 55-60%, Gr2 DD. Nl RV.    Neuromuscular disorder (HCC)    Neuropathy associated with endocrine disorder    Nonobstructive CAD (coronary artery disease)    a. 2012 Cath: mod, nonobs dzs; b. 10/2016 MV: EF 60%, no ischemia.   OSA  on CPAP 05/05/2017   Unattended Home Sleep Test 7/2/813-AHI 38.6/hour, desaturation to 64%, body weight 261 pounds   PONV (postoperative nausea and vomiting)    Postoperative anemia due to acute blood loss 11/07/2020   Postoperative hemorrhagic shock 03/20/2021   Sleep apnea     uses cpap asked to bring mask and tubing    Family History  Problem Relation Age of Onset   Diabetes Mellitus II Mother    Emphysema Father 71   Heart attack Father    Colon cancer Neg Hx    Esophageal cancer Neg Hx    Rectal cancer Neg Hx    Stomach cancer Neg Hx     Past Surgical History:  Procedure Laterality Date   ABDOMINAL AORTOGRAM W/LOWER EXTREMITY N/A 12/10/2020   Procedure: ABDOMINAL AORTOGRAM W/LOWER EXTREMITY;  Surgeon: Magda Debby SAILOR, MD;  Location: [facility] LAB;  Service: Cardiovascular;  Laterality: N/A;   AMPUTATION Right 01/22/2021   Procedure: 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;  Surgeon: Harden Jerona GAILS, MD;  Location: [facility] OR;  Service: Orthopedics;  Laterality: Right;   AMPUTATION Right 03/17/2021   Procedure: RIGHT BELOW KNEE AMPUTATION;  Surgeon: Harden Jerona GAILS, MD;  Location: [facility] OR;  Service: Orthopedics;  Laterality: Right;   ANKLE FUSION Right 01/22/2021   Procedure: RIGHT FOOT TIBIOCALCANEAL FUSION;  Surgeon: Harden Jerona GAILS, MD;  Location: [facility] OR;  Service: Orthopedics;  Laterality: Right;   ANTERIOR FUSION CERVICAL SPINE  2012   CARDIAC CATHETERIZATION  2011   CARDIAC CATHETERIZATION N/A 11/09/2016   Procedure: Right Heart Cath;  Surgeon: Victory LELON Sharps, MD;  Location: [facility] INVASIVE CV LAB;  Service: Cardiovascular;   Laterality: N/A;   carpel tunnel     left wrist   CATARACT EXTRACTION     CATARACT EXTRACTION W/ INTRAOCULAR LENS  IMPLANT, BILATERAL  2013   CERVICAL LAMINECTOMY  2012   COLONOSCOPY N/A 01/14/2013   Procedure: COLONOSCOPY;  Surgeon: Norleen SAILOR Kiang, MD;  Location: [facility] ENDOSCOPY;  Service: Endoscopy;  Laterality: N/A;   CORONARY ARTERY BYPASS GRAFT  11/04/2020   LIMA-LAD, SVG-OM1, SVG-PDA (Dr Norleen Exon [facility]) dc 11/18/2020   EYE SURGERY     I & D EXTREMITY Right 02/19/2021   Procedure: RIGHT ANKLE DEBRIDEMENT AND PLACEMENT ANTIBIOTIC BEADS;  Surgeon: Harden Jerona GAILS, MD;  Location: [facility] OR;  Service: Orthopedics;  Laterality: Right;   KNEE SURGERY  1998   left   LEFT HEART CATH AND CORONARY ANGIOGRAPHY N/A 07/10/2020   Procedure: LEFT HEART CATH AND CORONARY ANGIOGRAPHY;  Surgeon: Wonda Sharper, MD;  Location: [facility] INVASIVE CV LAB;  Service: Cardiovascular;  Laterality: N/A;   LUMBAR LAMINECTOMY  2003   LUNG BIOPSY Left 12/26/2016   Procedure: LUNG BIOPSY;  Surgeon: Elspeth JAYSON Millers, MD;  Location: [facility] OR;  Service: Thoracic;  Laterality: Left;   PACEMAKER IMPLANT N/A 03/30/2020   Procedure: PACEMAKER IMPLANT;  Surgeon: Waddell Danelle LELON, MD;  Location: [facility];  Service: Cardiovascular;  Laterality: N/A;   PERIPHERAL VASCULAR INTERVENTION Right 12/10/2020   Procedure: PERIPHERAL VASCULAR INTERVENTION;  Surgeon: Magda Debby SAILOR, MD;  Location: [facility];  Service: Cardiovascular;  Laterality: Right;  SFA   POSTERIOR FUSION CERVICAL SPINE  2012   PPM GENERATOR REMOVAL N/A 12/14/2020   Procedure: PPM GENERATOR REMOVAL;  Surgeon: Waddell Danelle LELON, MD;  Location: [facility];  Service: Cardiovascular;  Laterality: N/A;   TEE WITHOUT CARDIOVERSION N/A 12/11/2020   Procedure: TRANSESOPHAGEAL ECHOCARDIOGRAM (TEE);  Surgeon:  Barbaraann Darryle Ned, MD;  Location: [facility] ENDOSCOPY;  Service: Cardiovascular;  Laterality: N/A;   TRIGGER FINGER RELEASE  2011   4th finger left hand   VIDEO  ASSISTED THORACOSCOPY Left 12/26/2016   Procedure: VIDEO ASSISTED THORACOSCOPY;  Surgeon: Elspeth JAYSON Millers, MD;  Location: [facility] OR;  Service: Thoracic;  Laterality: Left;   VIDEO BRONCHOSCOPY N/A 12/26/2016   Procedure: VIDEO BRONCHOSCOPY;  Surgeon: Elspeth JAYSON Millers, MD;  Location: [facility] OR;  Service: Thoracic;  Laterality: N/A;   Social History   Occupational History   Not on file  Tobacco Use   Smoking status: Never    Passive exposure: Past   Smokeless tobacco: Never  Vaping Use   Vaping status: Never Used  Substance and Sexual Activity   Alcohol use: No   Drug use: No   Sexual activity: Not Currently

## 2024-10-17 ENCOUNTER — Other Ambulatory Visit: Payer: Self-pay | Admitting: Internal Medicine

## 2024-12-04 ENCOUNTER — Encounter (HOSPITAL_COMMUNITY): Payer: Self-pay | Admitting: Orthopedic Surgery

## 2024-12-04 ENCOUNTER — Other Ambulatory Visit: Payer: Self-pay

## 2024-12-04 NOTE — Progress Notes (Signed)
 SDW CALL  Patient was given pre-op instructions over the phone. The opportunity was given for the patient to ask questions. No further questions asked. Patient verbalized understanding of instructions given.   PCP - Charlie Shepard COME EP cardiology - [facility]-Natalie Bradford,MD                          [facility] Boas Kiefer,MD Pulmonology - Dorethia Geronimo COME  PPM/ICD -[employer] Pacemaker Device Orders - requested from Dr. Floria fax #[phone] Rep Notified -  Chest x-ray - 07/23/24 EKG - 09/17/24 CE-tracing requested  Stress Test -  ECHO - 07/24/24 Cardiac Cath -07/10/20   Sleep Study - +OSA  CPAP - yes -Uses with 2 liters O2  Fasting Blood Sugar -120-125  Checks Blood Sugar - has a Freestyle Libre  THE MORNING OF SURGERY, take 12 units of Tresiba  insulin .  If your CBG is greater than 220 mg/dL the morning of surgery, you may take  of your dose of Humalog  insulin .   Check your blood sugar the morning of your surgery when you wake up and every 2 hours until you get to the Short Stay unit.  If your blood sugar is less than 70 mg/dL, you will need to treat for low blood sugar: Do not take insulin . Treat a low blood sugar (less than 70 mg/dL) with  cup of clear juice (cranberry or apple), 4 glucose tablets, OR glucose gel. Recheck blood sugar in 15 minutes after treatment (to make sure it is greater than 70 mg/dL). If your blood sugar is not greater than 70 mg/dL on recheck, call [phone] for further instructions. Report your blood sugar to the short stay nurse when you get to Short Stay.   Aspirin  Instructions:na  ERAS Protcol -clears until 0530 PRE-SURGERY Ensure or G2- no  COVID TEST- na   Anesthesia review: yes-pt has pacemaker,hx HTN,OSA,pulmonary fibrosis,DM.  Patient denies shortness of breath, fever, cough and chest pain over the phone call   Special instructions:    Oral Hygiene is also important to reduce your risk of infection.  Remember -  BRUSH YOUR TEETH THE MORNING OF SURGERY WITH YOUR REGULAR TOOTHPASTE R TOOTHPASTE.

## 2024-12-06 NOTE — H&P (Signed)
 Shane [name] is an 84 y.o. male.   Chief Complaint: Non healing left foot wound with Gangrene HPI: Rachard [name] is an 84 year old male with ischemic ulcers who presents with progressive painful ulceration of the left foot.  He has ischemic changes and has failed conservative therapy.    Past Medical History:  Diagnosis Date   Acute blood loss anemia    Anxiety    AV block, Mobitz 1    Cataract    Chronic kidney disease    d/t DM   CKD (chronic kidney disease), stage III (HCC)    COVID-19 virus infection 07/18/2020   Last Assessment & Plan:   Formatting of this note might be different from the original.  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 10/2019 Echo: EF 55-60%, Gr2 DD. Nl RV.    Neuromuscular disorder (HCC)    Neuropathy associated with endocrine disorder    Nonobstructive CAD (coronary artery disease)    a. 2012 Cath: mod, nonobs dzs; b. 10/2016 MV: EF 60%, no ischemia.   OSA on CPAP 05/05/2017   Unattended Home Sleep Test 7/2/813-AHI 38.6/hour, desaturation to 64%, body weight 261 pounds   PONV (postoperative nausea and vomiting)    Postoperative anemia due to acute blood loss 11/07/2020   Postoperative hemorrhagic shock 03/20/2021   Sleep apnea     uses cpap asked to bring mask and tubing    Past Surgical History:  Procedure Laterality Date   ABDOMINAL AORTOGRAM W/LOWER EXTREMITY N/A 12/10/2020   Procedure: ABDOMINAL AORTOGRAM W/LOWER EXTREMITY;  Surgeon: Magda Debby SAILOR, MD;  Location: [facility]  LAB;  Service: Cardiovascular;  Laterality: N/A;   AMPUTATION Right 01/22/2021   Procedure: 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;  Surgeon: Harden Jerona GAILS, MD;  Location: [facility] OR;  Service: Orthopedics;  Laterality: Right;   AMPUTATION Right 03/17/2021   Procedure: RIGHT BELOW KNEE AMPUTATION;  Surgeon: Harden Jerona GAILS, MD;  Location: [facility] OR;  Service: Orthopedics;  Laterality: Right;   ANKLE FUSION Right 01/22/2021   Procedure: RIGHT FOOT TIBIOCALCANEAL FUSION;  Surgeon: Harden Jerona GAILS, MD;  Location: [facility] OR;  Service: Orthopedics;  Laterality: Right;   ANTERIOR FUSION CERVICAL SPINE  2012   CARDIAC CATHETERIZATION  2011   CARDIAC CATHETERIZATION N/A 11/09/2016   Procedure: Right Heart Cath;  Surgeon: Victory LELON Sharps, MD;  Location: [facility] INVASIVE CV LAB;  Service: Cardiovascular;  Laterality: N/A;   carpel tunnel     left wrist   CATARACT EXTRACTION     CATARACT EXTRACTION W/ INTRAOCULAR LENS  IMPLANT, BILATERAL  2013   CERVICAL LAMINECTOMY  2012   COLONOSCOPY N/A 01/14/2013   Procedure: COLONOSCOPY;  Surgeon: Norleen SAILOR Kiang, MD;  Location: [facility] ENDOSCOPY;  Service: Endoscopy;  Laterality: N/A;   CORONARY ARTERY BYPASS GRAFT  11/04/2020   LIMA-LAD, SVG-OM1, SVG-PDA (Dr Norleen Exon [facility]) dc 11/18/2020   EYE SURGERY     I & D EXTREMITY  Right 02/19/2021   Procedure: RIGHT ANKLE DEBRIDEMENT AND PLACEMENT ANTIBIOTIC BEADS;  Surgeon: Harden Jerona GAILS, MD;  Location: [facility] OR;  Service: Orthopedics;  Laterality: Right;   KNEE SURGERY  1998   left   LEFT HEART CATH AND CORONARY ANGIOGRAPHY N/A 07/10/2020   Procedure: LEFT HEART CATH AND CORONARY ANGIOGRAPHY;  Surgeon: Wonda Sharper, MD;  Location: [facility] INVASIVE CV LAB;  Service: Cardiovascular;  Laterality: N/A;   LUMBAR LAMINECTOMY  2003   LUNG BIOPSY Left 12/26/2016   Procedure: LUNG BIOPSY;  Surgeon: Elspeth JAYSON Millers, MD;  Location: [facility] OR;  Service: Thoracic;  Laterality: Left;   PACEMAKER IMPLANT N/A 03/30/2020   Procedure: PACEMAKER IMPLANT;  Surgeon: Waddell Danelle ORN, MD;   Location: [facility] INVASIVE CV LAB;  Service: Cardiovascular;  Laterality: N/A;   PERIPHERAL VASCULAR INTERVENTION Right 12/10/2020   Procedure: PERIPHERAL VASCULAR INTERVENTION;  Surgeon: Magda Debby SAILOR, MD;  Location: [facility];  Service: Cardiovascular;  Laterality: Right;  SFA   POSTERIOR FUSION CERVICAL SPINE  2012   PPM GENERATOR REMOVAL N/A 12/14/2020   Procedure: PPM GENERATOR REMOVAL;  Surgeon: Waddell Danelle ORN, MD;  Location: [facility] LAB;  Service: Cardiovascular;  Laterality: N/A;   TEE WITHOUT CARDIOVERSION N/A 12/11/2020   Procedure: TRANSESOPHAGEAL ECHOCARDIOGRAM (TEE);  Surgeon: Barbaraann Darryle Debby, MD;  Location: [facility] ENDOSCOPY;  Service: Cardiovascular;  Laterality: N/A;   TRIGGER FINGER RELEASE  2011   4th finger left hand   VIDEO ASSISTED THORACOSCOPY Left 12/26/2016   Procedure: VIDEO ASSISTED THORACOSCOPY;  Surgeon: Elspeth JAYSON Millers, MD;  Location: [facility] OR;  Service: Thoracic;  Laterality: Left;   VIDEO BRONCHOSCOPY N/A 12/26/2016   Procedure: VIDEO BRONCHOSCOPY;  Surgeon: Elspeth JAYSON Millers, MD;  Location: [facility] OR;  Service: Thoracic;  Laterality: N/A;    Family History  Problem Relation Age of Onset   Diabetes Mellitus II Mother    Emphysema Father 61   Heart attack Father    Colon cancer Neg Hx    Esophageal cancer Neg Hx    Rectal cancer Neg Hx    Stomach cancer Neg Hx    Social History:  reports that he has never smoked. He has been exposed to tobacco smoke. He has never used smokeless tobacco. He reports that he does not drink alcohol and does not use drugs.  Allergies: Allergies[1]  No medications prior to admission.    No results found. However, due to the size of the patient record, not all encounters were searched. Please check Results Review for a complete set of results. No results found.  Review of Systems  All other systems reviewed and are negative.   There were no vitals taken for this visit. Physical Exam  Patient is alert,  oriented, no adenopathy, well-dressed, normal affect, normal respiratory effort.   Progressive ischemic ulceration over the lateral aspect of the left heel and forefoot due to microvascular disease. Ulcer measures 2x4 cm with ischemic tissue and increasing pain. Lateral transmetatarsal amputation site is 2x2 cm with healthy granulation tissue. No exposed bone, cellulitis, odor, or drainage. Condition not infected or life-threatening but has not healed on its own.  Assessment/Plan Ischemic left foot with gangrene.  Plan for left transtibial amputation for non-healing ischemic ulcers.    Maurilio Deland Collet, PA-C 12/06/2024, 9:05 AM       [1]  Allergies Allergen Reactions   Codeine Hives and Itching   Ofev  [Nintedanib ] Diarrhea    SEVERE DIARRHEA   Pirfenidone  Diarrhea and Other (See  Comments)    Esbriet  (Pirfenidone ) causes elevated LFTs. D/C on 06/14/17 and SEVERE DIARRHEA    Amoxicillin Itching

## 2024-12-06 NOTE — Progress Notes (Addendum)
 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  Most recent TTE 07/24/2024 showed LVEF 30 to 35%, grade 1 DD, moderately reduced RV systolic function, mild to moderate mitral regurgitation, normally functioning TAVR with mean gradient 10 mmHg.  Last seen in cardiology follow-up by Dr. Laneta Holland at [facility] on 09/17/2024.  Per note, Varied recent TTE results of LVF 30-50%. High risk for device upgrade/transvenous device. Recommend optimization of medical therapy. Patient to decide where he would like his cardiac care - [facility] vs [facility]. Briefly discussed LAAO given hx of spontaneous RP bleed.  Aspirin  was discontinued.  He was continued on Eliquis  5 mg twice daily.  No other medication changes.  36-month follow-up recommended.  Most recent PFTs 09/23/2022 showed FVC 2.27 (52%), FEV1 2.01 (65%), DLCO unc 11.84 (45%). Last seen in pulmonology follow-up by Dr. Geronimo on 10/09/2024.  Pulmonary fibrosis noted to be stable. He was recommended continue oxygen at night with CPAP.  8-month follow-up recommended.  Seen in follow-up by Dr. Harden on 10/14/2024 and noted to have progressive painful ischemic ulceration of the left foot.  Left transtibial amputation recommended.  He will need day of surgery labs and evaluation.  EKG 07/24/2024: Sinus rhythm.  Rate 72.  Prolonged PR interval.  Right bundle branch block.  TTE 07/24/2024:  1. Left ventricular ejection fraction, by estimation, is 30 to 35%. The  left ventricle has moderately decreased function. Left ventricular  endocardial border not optimally defined to evaluate regional wall motion.  The left ventricular internal cavity   size was mildly dilated. There is mild concentric left ventricular  hypertrophy. Left ventricular diastolic parameters are consistent with  Grade I diastolic dysfunction (impaired relaxation).   2. Right ventricular systolic function is moderately reduced. The right  ventricular size is mildly enlarged.   3. Left atrial size was moderately dilated.   4. Right atrial size was mildly dilated.   5. The mitral valve is normal in structure. Mild to moderate mitral valve  regurgitation. No evidence of mitral stenosis.   6. The aortic valve is normal in structure. Aortic valve regurgitation is  not visualized. No aortic stenosis is present. There is a 29 mm a native  pulmonic valve present in the aortic position. Procedure Date: 12/09/23.   7. Aortic dilatation noted. There is borderline dilatation of the  ascending aorta, measuring 38 mm.   8. The inferior vena cava is dilated in size with <50% respiratory  variability, suggesting right atrial pressure of 15 mmHg.      Barnes, [facility] Stay [facility]/Anesthesiology Phone [phone] 12/06/2024 10:37 AM

## 2024-12-06 NOTE — Anesthesia Preprocedure Evaluation (Addendum)
 "                                  Anesthesia Evaluation  Patient identified by MRN, date of birth, ID band Patient awake    Reviewed: Allergy & Precautions, NPO status , Patient's Chart, lab work & pertinent test results  History of Anesthesia Complications (+) PONV and history of anesthetic complications  Airway Mallampati: III  TM Distance: >3 FB Neck ROM: Full   Comment: Previous grade II view with MAC 4 Dental  (+) Dental Advisory Given   Pulmonary neg shortness of breath, sleep apnea and Continuous Positive Airway Pressure Ventilation , neg COPD, neg recent URI, PE (04/2024) Idiopathic pulmonary fibrosis on home oxygen 3L at night   Pulmonary exam normal breath sounds clear to auscultation       Cardiovascular hypertension (lisinopril ), Pt. on medications (-) angina + CAD, + Past MI, + CABG (11/2020), + Peripheral Vascular Disease and +CHF (EF 30-35% 07/2024)  + dysrhythmias (Mobitz type 1 AV block, RBBB, SSS) Atrial Fibrillation + pacemaker + Valvular Problems/Murmurs (s/p TAVR 08/2023 (required CPR and intubation)) AS  Rhythm:Regular Rate:Normal  HLD, h/o endocarditis   Neuro/Psych  PSYCHIATRIC DISORDERS Anxiety Depression     Neuromuscular disease (neuropathy)    GI/Hepatic Neg liver ROS,GERD  Medicated,,  Endo/Other  diabetes, Type 2, Insulin  Dependent  Adrenal insufficiency  Renal/GU CRFRenal disease     Musculoskeletal Gangrene of left foot   Abdominal  (+) + obese  Peds  Hematology  (+) Blood dyscrasia, anemia Lab Results      Component                Value               Date                      WBC                      12.7 (H)            07/26/2024                HGB                      11.1 (L)            07/26/2024                HCT                      34.6 (L)            07/26/2024                MCV                      96.1                07/26/2024                PLT                      152                 07/26/2024               Anesthesia Other Findings Last  Eliquis : this morning  Reproductive/Obstetrics                              Anesthesia Physical Anesthesia Plan  ASA: 4  Anesthesia Plan: Regional and MAC   Post-op Pain Management: Regional block* and Tylenol  PO (pre-op)*   Induction: Intravenous  PONV Risk Score and Plan: 2 and Ondansetron , Dexamethasone , Propofol  infusion and Treatment may vary due to age or medical condition  Airway Management Planned: Natural Airway and Simple Face Mask  Additional Equipment:   Intra-op Plan:   Post-operative Plan:   Informed Consent: I have reviewed the patients History and Physical, chart, labs and discussed the procedure including the risks, benefits and alternatives for the proposed anesthesia with the patient or authorized representative who has indicated his/her understanding and acceptance.     Dental advisory given  Plan Discussed with: CRNA and Anesthesiologist  Anesthesia Plan Comments: (Discussed potential risks of nerve blocks including, but not limited to, infection, bleeding, nerve damage, seizures, pneumothorax, respiratory depression, and potential failure of the block. Alternatives to nerve blocks discussed. All questions answered.  Discussed with patient risks of MAC including, but not limited to, minor pain or discomfort, hearing people in the room, and possible need for backup general anesthesia. Risks for general anesthesia also discussed including, but not limited to, sore throat, hoarse voice, chipped/damaged teeth, injury to vocal cords, nausea and vomiting, allergic reactions, lung infection, heart attack, stroke, and death. All questions answered.   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  Most recent TTE 07/24/2024 showed LVEF 30 to 35%, grade 1 DD, moderately reduced RV systolic function, mild to moderate mitral regurgitation, normally functioning TAVR with mean gradient 10 mmHg.  Last seen in cardiology follow-up by Dr. Laneta Holland at [facility] on 09/17/2024.  Per note, Varied recent TTE results of LVF 30-50%. High risk for device upgrade/transvenous device. Recommend optimization of medical therapy. Patient to decide where he would like his cardiac care - [facility] vs [facility]. Briefly discussed LAAO given hx of spontaneous RP bleed.  Aspirin  was discontinued.  He was continued on Eliquis  5 mg twice daily.  No other medication changes.  38-month follow-up recommended.  Most recent PFTs 09/23/2022 showed FVC 2.27 (52%), FEV1 2.01 (65%), DLCO unc 11.84 (45%). Last seen in pulmonology follow-up by Dr. Geronimo on 10/09/2024.  Pulmonary fibrosis noted to be stable. He was recommended continue oxygen at night with CPAP.  41-month follow-up recommended.  Seen in follow-up by Dr. Harden on 10/14/2024 and noted to have progressive painful ischemic ulceration of the left foot.  Left transtibial amputation recommended.  He will need day of surgery labs and evaluation.  EKG 07/24/2024: Sinus rhythm.  Rate 72.  Prolonged PR interval.  Right bundle branch block.  TTE 07/24/2024: 1. Left ventricular ejection fraction, by estimation, is 30 to 35%. The  left ventricle has moderately decreased function. Left ventricular  endocardial border not optimally defined to evaluate regional wall motion.  The left ventricular internal cavity  size was mildly dilated. There is mild concentric left ventricular  hypertrophy. Left ventricular diastolic parameters are consistent with  Grade I  diastolic dysfunction (impaired relaxation).  2. Right ventricular systolic function is moderately reduced. The right   ventricular size is mildly enlarged.  3. Left atrial size was moderately dilated.  4. Right atrial size was mildly dilated.  5. The mitral valve is normal in structure. Mild to moderate mitral valve  regurgitation. No evidence of mitral stenosis.  6. The aortic valve is normal in structure. Aortic valve regurgitation is  not visualized. No aortic stenosis is present. There is a 29 mm a native  pulmonic valve present in the aortic position. Procedure Date: 12/09/23.  7. Aortic dilatation noted. There is borderline dilatation of the  ascending aorta, measuring 38 mm.  8. The inferior vena cava is dilated in size with <50% respiratory  variability, suggesting right atrial pressure of 15 mmHg.    )         Anesthesia Quick Evaluation  "

## 2024-12-11 ENCOUNTER — Encounter (HOSPITAL_COMMUNITY): Admitting: Physician Assistant

## 2024-12-11 ENCOUNTER — Encounter (HOSPITAL_COMMUNITY)
Admission: RE | Disposition: A | Discharge status: Rehabilitation Facility | Payer: Self-pay | Source: Home / Self Care | Attending: Internal Medicine

## 2024-12-11 ENCOUNTER — Inpatient Hospital Stay (HOSPITAL_COMMUNITY): Admitting: Physician Assistant

## 2024-12-11 ENCOUNTER — Inpatient Hospital Stay (HOSPITAL_COMMUNITY)
Admission: RE | Admit: 2024-12-11 | Discharge: 2024-12-25 | DRG: 853 | Disposition: A | Discharge status: Rehabilitation Facility | Attending: Internal Medicine | Admitting: Internal Medicine

## 2024-12-11 DIAGNOSIS — I70262 Atherosclerosis of native arteries of extremities with gangrene, left leg: Secondary | ICD-10-CM | POA: Diagnosis present

## 2024-12-11 DIAGNOSIS — K5900 Constipation, unspecified: Secondary | ICD-10-CM | POA: Diagnosis not present

## 2024-12-11 DIAGNOSIS — R1030 Lower abdominal pain, unspecified: Secondary | ICD-10-CM | POA: Diagnosis not present

## 2024-12-11 DIAGNOSIS — L299 Pruritus, unspecified: Secondary | ICD-10-CM | POA: Diagnosis not present

## 2024-12-11 DIAGNOSIS — I96 Gangrene, not elsewhere classified: Secondary | ICD-10-CM | POA: Diagnosis not present

## 2024-12-11 DIAGNOSIS — N1832 Chronic kidney disease, stage 3b: Secondary | ICD-10-CM | POA: Diagnosis present

## 2024-12-11 DIAGNOSIS — N183 Chronic kidney disease, stage 3 unspecified: Secondary | ICD-10-CM | POA: Diagnosis not present

## 2024-12-11 DIAGNOSIS — E11649 Type 2 diabetes mellitus with hypoglycemia without coma: Secondary | ICD-10-CM | POA: Diagnosis not present

## 2024-12-11 DIAGNOSIS — G9340 Encephalopathy, unspecified: Secondary | ICD-10-CM

## 2024-12-11 DIAGNOSIS — E1152 Type 2 diabetes mellitus with diabetic peripheral angiopathy with gangrene: Secondary | ICD-10-CM

## 2024-12-11 DIAGNOSIS — R195 Other fecal abnormalities: Secondary | ICD-10-CM | POA: Diagnosis not present

## 2024-12-11 DIAGNOSIS — Z9981 Dependence on supplemental oxygen: Secondary | ICD-10-CM

## 2024-12-11 DIAGNOSIS — Z79899 Other long term (current) drug therapy: Secondary | ICD-10-CM

## 2024-12-11 DIAGNOSIS — I350 Nonrheumatic aortic (valve) stenosis: Secondary | ICD-10-CM | POA: Diagnosis present

## 2024-12-11 DIAGNOSIS — R1319 Other dysphagia: Secondary | ICD-10-CM | POA: Diagnosis not present

## 2024-12-11 DIAGNOSIS — I5022 Chronic systolic (congestive) heart failure: Secondary | ICD-10-CM

## 2024-12-11 DIAGNOSIS — Z7901 Long term (current) use of anticoagulants: Secondary | ICD-10-CM

## 2024-12-11 DIAGNOSIS — E440 Moderate protein-calorie malnutrition: Secondary | ICD-10-CM | POA: Insufficient documentation

## 2024-12-11 DIAGNOSIS — Z885 Allergy status to narcotic agent status: Secondary | ICD-10-CM

## 2024-12-11 DIAGNOSIS — Z888 Allergy status to other drugs, medicaments and biological substances status: Secondary | ICD-10-CM

## 2024-12-11 DIAGNOSIS — D649 Anemia, unspecified: Secondary | ICD-10-CM | POA: Diagnosis not present

## 2024-12-11 DIAGNOSIS — Z794 Long term (current) use of insulin: Secondary | ICD-10-CM

## 2024-12-11 DIAGNOSIS — Z833 Family history of diabetes mellitus: Secondary | ICD-10-CM

## 2024-12-11 DIAGNOSIS — N170 Acute kidney failure with tubular necrosis: Secondary | ICD-10-CM | POA: Diagnosis not present

## 2024-12-11 DIAGNOSIS — Z880 Allergy status to penicillin: Secondary | ICD-10-CM

## 2024-12-11 DIAGNOSIS — I495 Sick sinus syndrome: Secondary | ICD-10-CM | POA: Diagnosis present

## 2024-12-11 DIAGNOSIS — E1122 Type 2 diabetes mellitus with diabetic chronic kidney disease: Secondary | ICD-10-CM | POA: Diagnosis present

## 2024-12-11 DIAGNOSIS — G9341 Metabolic encephalopathy: Secondary | ICD-10-CM | POA: Diagnosis not present

## 2024-12-11 DIAGNOSIS — Z981 Arthrodesis status: Secondary | ICD-10-CM

## 2024-12-11 DIAGNOSIS — Z89511 Acquired absence of right leg below knee: Secondary | ICD-10-CM

## 2024-12-11 DIAGNOSIS — R110 Nausea: Secondary | ICD-10-CM | POA: Diagnosis not present

## 2024-12-11 DIAGNOSIS — J84112 Idiopathic pulmonary fibrosis: Secondary | ICD-10-CM | POA: Diagnosis present

## 2024-12-11 DIAGNOSIS — D62 Acute posthemorrhagic anemia: Secondary | ICD-10-CM | POA: Diagnosis present

## 2024-12-11 DIAGNOSIS — E871 Hypo-osmolality and hyponatremia: Secondary | ICD-10-CM | POA: Diagnosis present

## 2024-12-11 DIAGNOSIS — E1142 Type 2 diabetes mellitus with diabetic polyneuropathy: Secondary | ICD-10-CM | POA: Diagnosis present

## 2024-12-11 DIAGNOSIS — G4733 Obstructive sleep apnea (adult) (pediatric): Secondary | ICD-10-CM | POA: Diagnosis present

## 2024-12-11 DIAGNOSIS — R197 Diarrhea, unspecified: Secondary | ICD-10-CM | POA: Diagnosis not present

## 2024-12-11 DIAGNOSIS — Y838 Other surgical procedures as the cause of abnormal reaction of the patient, or of later complication, without mention of misadventure at the time of the procedure: Secondary | ICD-10-CM | POA: Diagnosis not present

## 2024-12-11 DIAGNOSIS — I998 Other disorder of circulatory system: Secondary | ICD-10-CM | POA: Diagnosis present

## 2024-12-11 DIAGNOSIS — R34 Anuria and oliguria: Secondary | ICD-10-CM | POA: Diagnosis not present

## 2024-12-11 DIAGNOSIS — Z992 Dependence on renal dialysis: Secondary | ICD-10-CM | POA: Diagnosis not present

## 2024-12-11 DIAGNOSIS — R6521 Severe sepsis with septic shock: Secondary | ICD-10-CM | POA: Diagnosis present

## 2024-12-11 DIAGNOSIS — F32A Depression, unspecified: Secondary | ICD-10-CM | POA: Diagnosis present

## 2024-12-11 DIAGNOSIS — E785 Hyperlipidemia, unspecified: Secondary | ICD-10-CM | POA: Diagnosis present

## 2024-12-11 DIAGNOSIS — A419 Sepsis, unspecified organism: Secondary | ICD-10-CM | POA: Diagnosis present

## 2024-12-11 DIAGNOSIS — T8119XA Other postprocedural shock, initial encounter: Secondary | ICD-10-CM | POA: Diagnosis not present

## 2024-12-11 DIAGNOSIS — R112 Nausea with vomiting, unspecified: Secondary | ICD-10-CM | POA: Diagnosis not present

## 2024-12-11 DIAGNOSIS — E1151 Type 2 diabetes mellitus with diabetic peripheral angiopathy without gangrene: Secondary | ICD-10-CM | POA: Diagnosis not present

## 2024-12-11 DIAGNOSIS — D631 Anemia in chronic kidney disease: Secondary | ICD-10-CM | POA: Diagnosis present

## 2024-12-11 DIAGNOSIS — Z8249 Family history of ischemic heart disease and other diseases of the circulatory system: Secondary | ICD-10-CM

## 2024-12-11 DIAGNOSIS — J849 Interstitial pulmonary disease, unspecified: Secondary | ICD-10-CM | POA: Diagnosis present

## 2024-12-11 DIAGNOSIS — Z825 Family history of asthma and other chronic lower respiratory diseases: Secondary | ICD-10-CM

## 2024-12-11 DIAGNOSIS — E872 Acidosis, unspecified: Secondary | ICD-10-CM | POA: Diagnosis present

## 2024-12-11 DIAGNOSIS — R739 Hyperglycemia, unspecified: Secondary | ICD-10-CM

## 2024-12-11 DIAGNOSIS — I9581 Postprocedural hypotension: Secondary | ICD-10-CM | POA: Diagnosis not present

## 2024-12-11 DIAGNOSIS — I480 Paroxysmal atrial fibrillation: Secondary | ICD-10-CM | POA: Diagnosis not present

## 2024-12-11 DIAGNOSIS — I4430 Unspecified atrioventricular block: Secondary | ICD-10-CM | POA: Diagnosis present

## 2024-12-11 DIAGNOSIS — I110 Hypertensive heart disease with heart failure: Secondary | ICD-10-CM

## 2024-12-11 DIAGNOSIS — I739 Peripheral vascular disease, unspecified: Secondary | ICD-10-CM | POA: Diagnosis not present

## 2024-12-11 DIAGNOSIS — K219 Gastro-esophageal reflux disease without esophagitis: Secondary | ICD-10-CM | POA: Diagnosis present

## 2024-12-11 DIAGNOSIS — R21 Rash and other nonspecific skin eruption: Secondary | ICD-10-CM | POA: Diagnosis not present

## 2024-12-11 DIAGNOSIS — Z950 Presence of cardiac pacemaker: Secondary | ICD-10-CM

## 2024-12-11 DIAGNOSIS — I5020 Unspecified systolic (congestive) heart failure: Secondary | ICD-10-CM | POA: Diagnosis not present

## 2024-12-11 DIAGNOSIS — F05 Delirium due to known physiological condition: Secondary | ICD-10-CM | POA: Diagnosis not present

## 2024-12-11 DIAGNOSIS — R1312 Dysphagia, oropharyngeal phase: Secondary | ICD-10-CM | POA: Diagnosis present

## 2024-12-11 DIAGNOSIS — I2510 Atherosclerotic heart disease of native coronary artery without angina pectoris: Secondary | ICD-10-CM | POA: Diagnosis present

## 2024-12-11 DIAGNOSIS — E875 Hyperkalemia: Secondary | ICD-10-CM | POA: Diagnosis not present

## 2024-12-11 DIAGNOSIS — Z89512 Acquired absence of left leg below knee: Secondary | ICD-10-CM

## 2024-12-11 DIAGNOSIS — E11621 Type 2 diabetes mellitus with foot ulcer: Secondary | ICD-10-CM | POA: Diagnosis present

## 2024-12-11 DIAGNOSIS — E1165 Type 2 diabetes mellitus with hyperglycemia: Secondary | ICD-10-CM | POA: Diagnosis not present

## 2024-12-11 DIAGNOSIS — I130 Hypertensive heart and chronic kidney disease with heart failure and stage 1 through stage 4 chronic kidney disease, or unspecified chronic kidney disease: Secondary | ICD-10-CM | POA: Diagnosis present

## 2024-12-11 DIAGNOSIS — I952 Hypotension due to drugs: Secondary | ICD-10-CM | POA: Diagnosis not present

## 2024-12-11 DIAGNOSIS — L97529 Non-pressure chronic ulcer of other part of left foot with unspecified severity: Secondary | ICD-10-CM | POA: Diagnosis present

## 2024-12-11 DIAGNOSIS — T3995XA Adverse effect of unspecified nonopioid analgesic, antipyretic and antirheumatic, initial encounter: Secondary | ICD-10-CM | POA: Diagnosis not present

## 2024-12-11 DIAGNOSIS — Z951 Presence of aortocoronary bypass graft: Secondary | ICD-10-CM | POA: Diagnosis not present

## 2024-12-11 DIAGNOSIS — R578 Other shock: Secondary | ICD-10-CM | POA: Diagnosis not present

## 2024-12-11 DIAGNOSIS — F419 Anxiety disorder, unspecified: Secondary | ICD-10-CM | POA: Diagnosis present

## 2024-12-11 DIAGNOSIS — Z952 Presence of prosthetic heart valve: Secondary | ICD-10-CM

## 2024-12-11 DIAGNOSIS — Z8616 Personal history of COVID-19: Secondary | ICD-10-CM | POA: Diagnosis not present

## 2024-12-11 DIAGNOSIS — N179 Acute kidney failure, unspecified: Secondary | ICD-10-CM | POA: Diagnosis not present

## 2024-12-11 DIAGNOSIS — I4891 Unspecified atrial fibrillation: Secondary | ICD-10-CM | POA: Diagnosis not present

## 2024-12-11 DIAGNOSIS — E8770 Fluid overload, unspecified: Secondary | ICD-10-CM | POA: Diagnosis not present

## 2024-12-11 DIAGNOSIS — Z87442 Personal history of urinary calculi: Secondary | ICD-10-CM

## 2024-12-11 HISTORY — PX: APPLICATION OF WOUND VAC: SHX5189

## 2024-12-11 HISTORY — PX: AMPUTATION: SHX166

## 2024-12-11 LAB — COMPREHENSIVE METABOLIC PANEL WITH GFR
ALT: 13 U/L (ref 0–44)
AST: 20 U/L (ref 15–41)
Albumin: 3.6 g/dL (ref 3.5–5.0)
Alkaline Phosphatase: 124 U/L (ref 38–126)
Anion gap: 13 (ref 5–15)
BUN: 44 mg/dL — ABNORMAL HIGH (ref 8–23)
CO2: 26 mmol/L (ref 22–32)
Calcium: 9 mg/dL (ref 8.9–10.3)
Chloride: 97 mmol/L — ABNORMAL LOW (ref 98–111)
Creatinine, Ser: 1.41 mg/dL — ABNORMAL HIGH (ref 0.61–1.24)
GFR, Estimated: 49 mL/min — ABNORMAL LOW
Glucose, Bld: 137 mg/dL — ABNORMAL HIGH (ref 70–99)
Potassium: 3.6 mmol/L (ref 3.5–5.1)
Sodium: 136 mmol/L (ref 135–145)
Total Bilirubin: 0.5 mg/dL (ref 0.0–1.2)
Total Protein: 7.1 g/dL (ref 6.5–8.1)

## 2024-12-11 LAB — CBC WITH DIFFERENTIAL/PLATELET
Abs Immature Granulocytes: 0.03 K/uL (ref 0.00–0.07)
Basophils Absolute: 0 K/uL (ref 0.0–0.1)
Basophils Relative: 0 %
Eosinophils Absolute: 0.4 K/uL (ref 0.0–0.5)
Eosinophils Relative: 5 %
HCT: 36.2 % — ABNORMAL LOW (ref 39.0–52.0)
Hemoglobin: 12 g/dL — ABNORMAL LOW (ref 13.0–17.0)
Immature Granulocytes: 0 %
Lymphocytes Relative: 15 %
Lymphs Abs: 1.4 K/uL (ref 0.7–4.0)
MCH: 30.1 pg (ref 26.0–34.0)
MCHC: 33.1 g/dL (ref 30.0–36.0)
MCV: 90.7 fL (ref 80.0–100.0)
Monocytes Absolute: 1 K/uL (ref 0.1–1.0)
Monocytes Relative: 11 %
Neutro Abs: 6.1 K/uL (ref 1.7–7.7)
Neutrophils Relative %: 69 %
Platelets: 194 K/uL (ref 150–400)
RBC: 3.99 MIL/uL — ABNORMAL LOW (ref 4.22–5.81)
RDW: 14.3 % (ref 11.5–15.5)
WBC: 8.9 K/uL (ref 4.0–10.5)
nRBC: 0 % (ref 0.0–0.2)

## 2024-12-11 LAB — GLUCOSE, CAPILLARY
Glucose-Capillary: 194 mg/dL — ABNORMAL HIGH (ref 70–99)
Glucose-Capillary: 147 mg/dL — ABNORMAL HIGH (ref 70–99)
Glucose-Capillary: 91 mg/dL (ref 70–99)
Glucose-Capillary: 98 mg/dL (ref 70–99)

## 2024-12-11 LAB — HEMOGLOBIN A1C
Hgb A1c MFr Bld: 7.6 % — ABNORMAL HIGH (ref 4.8–5.6)
Mean Plasma Glucose: 171.42 mg/dL

## 2024-12-11 MED ORDER — ESCITALOPRAM OXALATE 10 MG PO TABS
10.0000 mg | ORAL_TABLET | Freq: Every day | ORAL | Status: DC
  Administered 2024-12-12 – 2024-12-13 (×2): 10 mg via ORAL
  Filled 2024-12-11 (×2): qty 1

## 2024-12-11 MED ORDER — POTASSIUM CHLORIDE CRYS ER 20 MEQ PO TBCR: 40.0000 meq | EXTENDED_RELEASE_TABLET | Freq: Every day | ORAL | Status: DC | PRN

## 2024-12-11 MED ORDER — VITAMIN D (ERGOCALCIFEROL) 1.25 MG (50000 UNIT) PO CAPS
50000.0000 [IU] | ORAL_CAPSULE | Freq: Every day | ORAL | Status: AC
  Administered 2024-12-11: 50000 [IU] via ORAL
  Filled 2024-12-11 (×2): qty 1

## 2024-12-11 MED ORDER — MAGNESIUM OXIDE -MG SUPPLEMENT 400 (240 MG) MG PO TABS
200.0000 mg | ORAL_TABLET | Freq: Every day | ORAL | Status: DC
  Administered 2024-12-11 – 2024-12-12 (×2): 200 mg via ORAL
  Filled 2024-12-11: qty 1
  Filled 2024-12-11: qty 0.5

## 2024-12-11 MED ORDER — PHENOL 1.4 % MT LIQD: 1.0000 | OROMUCOSAL | Status: DC | PRN

## 2024-12-11 MED ORDER — FENTANYL CITRATE (PF) 100 MCG/2ML IJ SOLN
INTRAMUSCULAR | Status: AC
  Filled 2024-12-11: qty 2

## 2024-12-11 MED ORDER — JUVEN PO PACK
1.0000 | PACK | Freq: Two times a day (BID) | ORAL | Status: DC
  Administered 2024-12-11 – 2024-12-13 (×4): 1 via ORAL
  Filled 2024-12-11 (×4): qty 1

## 2024-12-11 MED ORDER — LABETALOL HCL 5 MG/ML IV SOLN: 10.0000 mg | INTRAVENOUS | Status: DC | PRN

## 2024-12-11 MED ORDER — ACETAMINOPHEN 325 MG PO TABS
325.0000 mg | ORAL_TABLET | Freq: Four times a day (QID) | ORAL | Status: DC | PRN
  Administered 2024-12-12 – 2024-12-13 (×3): 650 mg via ORAL
  Filled 2024-12-11 (×3): qty 2

## 2024-12-11 MED ORDER — CEFAZOLIN SODIUM-DEXTROSE 2-4 GM/100ML-% IV SOLN
2.0000 g | INTRAVENOUS | Status: AC
  Administered 2024-12-11: 2 g via INTRAVENOUS
  Filled 2024-12-11: qty 100

## 2024-12-11 MED ORDER — HYDROXYZINE HCL 25 MG PO TABS
25.0000 mg | ORAL_TABLET | Freq: Every day | ORAL | Status: DC
  Administered 2024-12-11 – 2024-12-12 (×2): 25 mg via ORAL
  Filled 2024-12-11 (×2): qty 1

## 2024-12-11 MED ORDER — OLOPATADINE HCL 0.1 % OP SOLN: 1.0000 [drp] | Freq: Every day | OPHTHALMIC | Status: DC | PRN

## 2024-12-11 MED ORDER — LACTATED RINGERS IV SOLN: INTRAVENOUS | Status: DC

## 2024-12-11 MED ORDER — OXYCODONE HCL 5 MG PO TABS
10.0000 mg | ORAL_TABLET | ORAL | Status: DC | PRN
  Administered 2024-12-11: 15 mg via ORAL
  Filled 2024-12-11 (×2): qty 3

## 2024-12-11 MED ORDER — INSULIN ASPART 100 UNIT/ML IJ SOLN
0.0000 [IU] | Freq: Three times a day (TID) | INTRAMUSCULAR | Status: DC
  Administered 2024-12-12: 2 [IU] via SUBCUTANEOUS
  Administered 2024-12-12 (×2): 1 [IU] via SUBCUTANEOUS
  Administered 2024-12-13: 3 [IU] via SUBCUTANEOUS
  Filled 2024-12-11: qty 2
  Filled 2024-12-11 (×2): qty 1
  Filled 2024-12-11: qty 3

## 2024-12-11 MED ORDER — LORAZEPAM 1 MG PO TABS: 1.0000 mg | ORAL_TABLET | Freq: Every evening | ORAL | Status: DC | PRN

## 2024-12-11 MED ORDER — RISAQUAD PO CAPS
1.0000 | ORAL_CAPSULE | Freq: Every day | ORAL | Status: DC
  Administered 2024-12-11 – 2024-12-24 (×14): 1 via ORAL
  Filled 2024-12-11 (×14): qty 1

## 2024-12-11 MED ORDER — FUROSEMIDE 20 MG PO TABS: 20.0000 mg | ORAL_TABLET | Freq: Every day | ORAL | Status: DC

## 2024-12-11 MED ORDER — DOCUSATE SODIUM 100 MG PO CAPS
100.0000 mg | ORAL_CAPSULE | Freq: Every day | ORAL | Status: DC
  Administered 2024-12-12 – 2024-12-13 (×2): 100 mg via ORAL
  Filled 2024-12-11 (×2): qty 1

## 2024-12-11 MED ORDER — INSULIN GLARGINE 100 UNIT/ML ~~LOC~~ SOLN
10.0000 [IU] | Freq: Every day | SUBCUTANEOUS | Status: DC
  Administered 2024-12-12 – 2024-12-14 (×3): 10 [IU] via SUBCUTANEOUS
  Filled 2024-12-11 (×3): qty 0.1

## 2024-12-11 MED ORDER — OXYCODONE HCL 5 MG PO TABS
5.0000 mg | ORAL_TABLET | ORAL | Status: DC | PRN
  Administered 2024-12-11 – 2024-12-13 (×5): 10 mg via ORAL
  Filled 2024-12-11 (×5): qty 2

## 2024-12-11 MED ORDER — ORAL CARE MOUTH RINSE: 15.0000 mL | Freq: Once | OROMUCOSAL | Status: AC

## 2024-12-11 MED ORDER — GABAPENTIN 100 MG PO CAPS
100.0000 mg | ORAL_CAPSULE | Freq: Three times a day (TID) | ORAL | Status: DC
  Administered 2024-12-11 – 2024-12-13 (×5): 100 mg via ORAL
  Filled 2024-12-11 (×5): qty 1

## 2024-12-11 MED ORDER — ALIGN 10 MG PO CAPS: 10.0000 mg | ORAL_CAPSULE | Freq: Every day | ORAL | Status: DC

## 2024-12-11 MED ORDER — ATORVASTATIN CALCIUM 80 MG PO TABS
80.0000 mg | ORAL_TABLET | Freq: Every day | ORAL | Status: DC
  Administered 2024-12-11 – 2024-12-13 (×3): 80 mg via ORAL
  Filled 2024-12-11 (×3): qty 1

## 2024-12-11 MED ORDER — AMISULPRIDE (ANTIEMETIC) 5 MG/2ML IV SOLN: 10.0000 mg | Freq: Once | INTRAVENOUS | Status: DC | PRN

## 2024-12-11 MED ORDER — PHENYLEPHRINE HCL-NACL 20-0.9 MG/250ML-% IV SOLN
INTRAVENOUS | Status: AC
  Filled 2024-12-11: qty 500

## 2024-12-11 MED ORDER — MUPIROCIN 2 % EX OINT: 1.0000 | TOPICAL_OINTMENT | Freq: Every day | CUTANEOUS | Status: DC | PRN

## 2024-12-11 MED ORDER — PANTOPRAZOLE SODIUM 40 MG PO TBEC
40.0000 mg | DELAYED_RELEASE_TABLET | Freq: Two times a day (BID) | ORAL | Status: DC
  Administered 2024-12-11 – 2024-12-15 (×8): 40 mg via ORAL
  Filled 2024-12-11 (×6): qty 1
  Filled 2024-12-11: qty 2
  Filled 2024-12-11 (×2): qty 1

## 2024-12-11 MED ORDER — FENTANYL CITRATE (PF) 100 MCG/2ML IJ SOLN
25.0000 ug | INTRAMUSCULAR | Status: DC | PRN
  Administered 2024-12-11 (×5): 25 ug via INTRAVENOUS

## 2024-12-11 MED ORDER — METHOCARBAMOL 500 MG PO TABS
500.0000 mg | ORAL_TABLET | Freq: Three times a day (TID) | ORAL | Status: DC | PRN
  Administered 2024-12-11: 500 mg via ORAL
  Filled 2024-12-11: qty 1

## 2024-12-11 MED ORDER — PROPOFOL 1000 MG/100ML IV EMUL
INTRAVENOUS | Status: AC
  Filled 2024-12-11: qty 500

## 2024-12-11 MED ORDER — POLYETHYLENE GLYCOL 3350 17 G PO PACK: 17.0000 g | PACK | Freq: Every day | ORAL | Status: DC | PRN

## 2024-12-11 MED ORDER — ALBUTEROL SULFATE (2.5 MG/3ML) 0.083% IN NEBU: 2.5000 mg | INHALATION_SOLUTION | RESPIRATORY_TRACT | Status: DC | PRN

## 2024-12-11 MED ORDER — DEXTROMETHORPHAN POLISTIREX ER 30 MG/5ML PO SUER: 15.0000 mg | Freq: Every day | ORAL | Status: DC | PRN

## 2024-12-11 MED ORDER — ZINC SULFATE 220 (50 ZN) MG PO CAPS
220.0000 mg | ORAL_CAPSULE | Freq: Every day | ORAL | Status: DC
  Administered 2024-12-11 – 2024-12-12 (×2): 220 mg via ORAL
  Filled 2024-12-11 (×2): qty 1

## 2024-12-11 MED ORDER — DIPHENHYDRAMINE HCL 25 MG PO CAPS: 50.0000 mg | ORAL_CAPSULE | Freq: Every evening | ORAL | Status: DC | PRN

## 2024-12-11 MED ORDER — ROPIVACAINE HCL 5 MG/ML IJ SOLN
INTRAMUSCULAR | Status: DC | PRN
  Administered 2024-12-11: 15 mL via PERINEURAL
  Administered 2024-12-11: 30 mL via PERINEURAL

## 2024-12-11 MED ORDER — PHENYLEPHRINE 80 MCG/ML (10ML) SYRINGE FOR IV PUSH (FOR BLOOD PRESSURE SUPPORT)
PREFILLED_SYRINGE | INTRAVENOUS | Status: AC
  Filled 2024-12-11: qty 10

## 2024-12-11 MED ORDER — CHLORHEXIDINE GLUCONATE 0.12 % MT SOLN
15.0000 mL | Freq: Once | OROMUCOSAL | Status: AC
  Administered 2024-12-11: 15 mL via OROMUCOSAL
  Filled 2024-12-11: qty 15

## 2024-12-11 MED ORDER — PROPOFOL 500 MG/50ML IV EMUL
INTRAVENOUS | Status: DC | PRN
  Administered 2024-12-11: 70 ug/kg/min via INTRAVENOUS
  Administered 2024-12-11: 40 mg via INTRAVENOUS

## 2024-12-11 MED ORDER — SODIUM CHLORIDE 0.9 % IV SOLN: 250.0000 mL | INTRAVENOUS | Status: AC | PRN

## 2024-12-11 MED ORDER — ONDANSETRON HCL 4 MG/2ML IJ SOLN
4.0000 mg | Freq: Four times a day (QID) | INTRAMUSCULAR | Status: DC | PRN
  Administered 2024-12-12 – 2024-12-25 (×7): 4 mg via INTRAVENOUS
  Filled 2024-12-11 (×7): qty 2

## 2024-12-11 MED ORDER — 0.9 % SODIUM CHLORIDE (POUR BTL) OPTIME
TOPICAL | Status: DC | PRN
  Administered 2024-12-11: 1000 mL

## 2024-12-11 MED ORDER — FUROSEMIDE 40 MG PO TABS
40.0000 mg | ORAL_TABLET | Freq: Two times a day (BID) | ORAL | Status: DC
  Administered 2024-12-11: 40 mg via ORAL
  Filled 2024-12-11: qty 1

## 2024-12-11 MED ORDER — ACETAMINOPHEN 500 MG PO TABS
1000.0000 mg | ORAL_TABLET | Freq: Four times a day (QID) | ORAL | Status: AC
  Administered 2024-12-11 (×3): 1000 mg via ORAL
  Filled 2024-12-11 (×4): qty 2

## 2024-12-11 MED ORDER — HYDROMORPHONE HCL 1 MG/ML IJ SOLN
0.5000 mg | INTRAMUSCULAR | Status: DC | PRN
  Administered 2024-12-11 (×3): 1 mg via INTRAVENOUS
  Filled 2024-12-11 (×3): qty 1

## 2024-12-11 MED ORDER — VITAMIN C 500 MG PO TABS
1000.0000 mg | ORAL_TABLET | Freq: Every day | ORAL | Status: DC
  Administered 2024-12-11 – 2024-12-13 (×3): 1000 mg via ORAL
  Filled 2024-12-11 (×3): qty 2

## 2024-12-11 MED ORDER — PHENYLEPHRINE 80 MCG/ML (10ML) SYRINGE FOR IV PUSH (FOR BLOOD PRESSURE SUPPORT)
PREFILLED_SYRINGE | INTRAVENOUS | Status: DC | PRN
  Administered 2024-12-11: 160 ug via INTRAVENOUS
  Administered 2024-12-11: 80 ug via INTRAVENOUS

## 2024-12-11 MED ORDER — TRAZODONE HCL 50 MG PO TABS
75.0000 mg | ORAL_TABLET | Freq: Every day | ORAL | Status: DC
  Administered 2024-12-11 – 2024-12-12 (×2): 75 mg via ORAL
  Filled 2024-12-11 (×2): qty 2

## 2024-12-11 MED ORDER — FERROUS SULFATE 325 (65 FE) MG PO TABS
325.0000 mg | ORAL_TABLET | Freq: Three times a day (TID) | ORAL | Status: DC
  Administered 2024-12-11 – 2024-12-13 (×6): 325 mg via ORAL
  Filled 2024-12-11 (×6): qty 1

## 2024-12-11 MED ORDER — METOPROLOL TARTRATE 5 MG/5ML IV SOLN: 2.0000 mg | INTRAVENOUS | Status: DC | PRN

## 2024-12-11 MED ORDER — ONDANSETRON HCL 4 MG PO TABS
4.0000 mg | ORAL_TABLET | ORAL | Status: DC | PRN
  Administered 2024-12-11: 4 mg via ORAL
  Filled 2024-12-11 (×2): qty 1

## 2024-12-11 MED ORDER — SODIUM CHLORIDE 0.9% FLUSH: 3.0000 mL | INTRAVENOUS | Status: DC | PRN

## 2024-12-11 MED ORDER — ADULT MULTIVITAMIN W/MINERALS CH
1.0000 | ORAL_TABLET | Freq: Every morning | ORAL | Status: DC
  Administered 2024-12-12 – 2024-12-13 (×2): 1 via ORAL
  Filled 2024-12-11 (×2): qty 1

## 2024-12-11 MED ORDER — OXYCODONE HCL 5 MG PO TABS: 5.0000 mg | ORAL_TABLET | Freq: Once | ORAL | Status: DC | PRN

## 2024-12-11 MED ORDER — ACETAMINOPHEN 500 MG PO TABS
1000.0000 mg | ORAL_TABLET | Freq: Once | ORAL | Status: AC
  Administered 2024-12-11: 1000 mg via ORAL
  Filled 2024-12-11: qty 2

## 2024-12-11 MED ORDER — INSULIN DEGLUDEC 200 UNIT/ML ~~LOC~~ SOPN: 10.0000 [IU] | PEN_INJECTOR | Freq: Every morning | SUBCUTANEOUS | Status: DC

## 2024-12-11 MED ORDER — VASHE WOUND IRRIGATION OPTIME
TOPICAL | Status: DC | PRN
  Administered 2024-12-11: 34 [oz_av]

## 2024-12-11 MED ORDER — SODIUM CHLORIDE 0.9% FLUSH
3.0000 mL | Freq: Two times a day (BID) | INTRAVENOUS | Status: DC
  Administered 2024-12-11 – 2024-12-24 (×24): 3 mL via INTRAVENOUS

## 2024-12-11 MED ORDER — HYDRALAZINE HCL 20 MG/ML IJ SOLN: 5.0000 mg | INTRAMUSCULAR | Status: DC | PRN

## 2024-12-11 MED ORDER — ALBUTEROL SULFATE HFA 108 (90 BASE) MCG/ACT IN AERS: 2.0000 | INHALATION_SPRAY | RESPIRATORY_TRACT | Status: DC | PRN

## 2024-12-11 MED ORDER — OXYCODONE HCL 5 MG/5ML PO SOLN: 5.0000 mg | Freq: Once | ORAL | Status: DC | PRN

## 2024-12-11 MED ORDER — INSULIN ASPART 100 UNIT/ML IJ SOLN: 0.0000 [IU] | INTRAMUSCULAR | Status: DC | PRN

## 2024-12-11 MED ORDER — EPINEPHRINE 0.3 MG/0.3ML IJ SOAJ: 0.3000 mg | INTRAMUSCULAR | Status: DC | PRN

## 2024-12-11 MED ORDER — MAGNESIUM 200 MG PO TABS: 200.0000 mg | ORAL_TABLET | Freq: Every day | ORAL | Status: DC

## 2024-12-11 MED ORDER — LISINOPRIL 10 MG PO TABS
10.0000 mg | ORAL_TABLET | Freq: Every day | ORAL | Status: DC
  Administered 2024-12-11: 10 mg via ORAL
  Filled 2024-12-11: qty 1

## 2024-12-11 NOTE — Progress Notes (Addendum)
 PHARMACY - ANTICOAGULATION CONSULT NOTE  Pharmacy Consult for heparin  Indication: atrial fibrillation  Allergies[1]  Patient Measurements: Height: 6' (182.9 cm) Weight: 103.4 kg (228 lb) IBW/kg (Calculated) : 77.6 HEPARIN  DW (KG): 98.9  Vital Signs: Temp: 97.7 F (36.5 C) (01/07 1350) Temp Source: Oral (01/07 0635) BP: 107/61 (01/07 1350) Pulse Rate: 63 (01/07 1350)  Labs: Recent Labs    12/11/24 0746  HGB 12.0*  HCT 36.2*  PLT 194  CREATININE 1.41*    Estimated Creatinine Clearance: 49.4 mL/min (A) (by C-G formula based on SCr of 1.41 mg/dL (H)).   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 10/2019 Echo: EF 55-60%, Gr2 DD. Nl RV.    Neuromuscular disorder (HCC)    Neuropathy associated with endocrine disorder    Nonobstructive CAD (coronary artery disease)    a. 2012 Cath: mod, nonobs dzs; b. 10/2016 MV: EF 60%, no ischemia.   OSA on CPAP 05/05/2017   Unattended Home Sleep Test 7/2/813-AHI 38.6/hour, desaturation to 64%, body weight 261 pounds   PONV (postoperative nausea and vomiting)    Postoperative anemia due to acute blood loss 11/07/2020   Postoperative hemorrhagic  shock 03/20/2021   Sleep apnea     uses cpap asked to bring mask and tubing    Assessment: 84 YO male with medical history for PAD and atrial fibrillation on apixaban  who is admitted for left foot gangrene now s/p BKA. Pharmacy consulted to manage heparin  while apixaban  on hold.  Per admission med history, last dose apixaban  1/07 @0500 . Confirmed with PA, will hold [facility] for this evening and re-evaluate based on CBC and bleeding.  Goal of Therapy:  Heparin  level 0.3-0.7 units/ml aPTT 66-102 seconds Monitor platelets by anticoagulation protocol: Yes   Plan:  Hold anticoagulation until tomorrow, re-evaluate resuming in AM Continue to monitor H&H and platelets  Thank you for allowing pharmacy to be a part of this patients care.  Shelba Collier, PharmD, BCPS Clinical Pharmacist     [1]  Allergies Allergen Reactions   Codeine Hives and Itching   Ofev  [Nintedanib ] Diarrhea    SEVERE DIARRHEA   Pirfenidone  Diarrhea and Other (See Comments)    Esbriet  (Pirfenidone ) causes elevated LFTs. D/C on 06/14/17 and SEVERE DIARRHEA    Amoxicillin Itching

## 2024-12-11 NOTE — Progress Notes (Addendum)
"    [facility]  Patient is s/p BKA on the left and is on anticoagulation for Afib.  Of note he did have a Valve replacement that is not mechanical and does not need anticoagulation for that.  We will hold anticoagulation for now until there is less than 50 CC of bloody drainage in the vac over a 24 hour period to protect from a hematoma formation.  I discussed this plan with Pharmacy.  Why Anticoagulation Isn't Always Needed: Made from biological tissue, they don't trigger the same clotting response as mechanical valves.  Maurilio Deland Collet PA-C "

## 2024-12-11 NOTE — Anesthesia Procedure Notes (Addendum)
 Anesthesia Regional Block: Popliteal block   Pre-Anesthetic Checklist: , timeout performed,  Correct Patient, Correct Site, Correct Laterality,  Correct Procedure, Correct Position, site marked,  Risks and benefits discussed,  Surgical consent,  Pre-op evaluation,  At surgeon's request and post-op pain management  Laterality: Left  Prep: chloraprep       Needles:  Injection technique: Single-shot  Needle Type: Echogenic Stimulator Needle     Needle Length: 9cm  Needle Gauge: 21     Additional Needles:   Procedures:,,,, ultrasound used (permanent image in chart),,    Narrative:  Start time: 12/11/2024 7:51 AM End time: 12/11/2024 7:58 AM Injection made incrementally with aspirations every 5 mL.  Performed by: Personally  Anesthesiologist: Peggye Delon Brunswick, MD  Additional Notes: Discussed risks and benefits of nerve block including, but not limited to, prolonged and/or permanent nerve injury involving sensory and/or motor function. Monitors were applied and a time-out was performed. The nerve and associated structures were visualized under ultrasound guidance. After negative aspiration, local anesthetic was slowly injected around the nerve. There was no evidence of high pressure during the procedure. There were no paresthesias. VSS remained stable and the patient tolerated the procedure well.

## 2024-12-11 NOTE — Interval H&P Note (Signed)
 History and Physical Interval Note:  12/11/2024 6:45 AM  Shane [name]  has presented today for surgery, with the diagnosis of Gangrene Left Foot.  The various methods of treatment have been discussed with the patient and family. After consideration of risks, benefits and other options for treatment, the patient has consented to  Procedures: AMPUTATION BELOW KNEE (Left) APPLICATION, WOUND VAC (Left) as a surgical intervention.  The patient's history has been reviewed, patient examined, no change in status, stable for surgery.  I have reviewed the patient's chart and labs.  Questions were answered to the patient's satisfaction.     Micah Barnier V Kieanna Rollo

## 2024-12-11 NOTE — Plan of Care (Signed)
" °  Problem: Nutritional: Goal: Will attain and maintain optimal nutritional status Outcome: Progressing   Problem: Neurological: Goal: Will regain or maintain usual level of consciousness Outcome: Progressing   Problem: Clinical Measurements: Goal: Ability to maintain clinical measurements within normal limits Outcome: Progressing Goal: Postoperative complications will be avoided or minimized Outcome: Progressing   Problem: Respiratory: Goal: Will regain and/or maintain adequate ventilation Outcome: Progressing   "

## 2024-12-11 NOTE — Op Note (Signed)
 12/11/2024  9:32 AM  PATIENT:  Shane [name]    PRE-OPERATIVE DIAGNOSIS:  Gangrene Left Foot  POST-OPERATIVE DIAGNOSIS:  Same  PROCEDURE:  AMPUTATION BELOW KNEE, APPLICATION, WOUND VAC Application of Kerecis micro graft 38 cm. Application of Prevena Peel and Place wound VAC dressings Application of Vive Wear stump shrinker and the Hanger limb protector  SURGEON:  Jerona LULLA Sage, MD  ANESTHESIA:   General  PREOPERATIVE INDICATIONS:  Shane [name] is a  84 y.o. male with a diagnosis of Gangrene Left Foot who failed conservative measures and elected for surgical management.    The risks benefits and alternatives were discussed with the patient preoperatively including but not limited to the risks of infection, bleeding, nerve injury, cardiopulmonary complications, the need for revision surgery, among others, and the patient was willing to proceed.  OPERATIVE IMPLANTS:   Implant Name Type Inv. Item Serial No. Manufacturer Lot No. LRB No. Used Action  GRAFT SKIN WND MICRO 38 - [id number] Tissue GRAFT SKIN WND MICRO 38  [employer] [id number] Left 1 Implanted     OPERATIVE FINDINGS: calcified vessels with good petechial bleeding  OPERATIVE PROCEDURE: Patient was brought to the operating room after undergoing a regional anesthetic.  After adequate levels anesthesia were obtained a thigh tourniquet was placed and the lower extremity was prepped using DuraPrep draped into a sterile field. The foot was draped out of the sterile field with impervious stockinette.  A timeout was called and the tourniquet inflated.  A transverse skin incision was made 12 cm distal to the tibial tubercle, the incision curved proximally, and a large posterior flap was created.  The tibia was transected just proximal to the skin incision and beveled anteriorly.  The fibula was transected just proximal to the tibial incision.  The sciatic nerve was pulled cut and allowed to retract.  The vascular bundles were  suture ligated with 2-0 silk.  The tourniquet was deflated and hemostasis obtained.  The wound was irrigated with Vashe.   The Kerecis micro powder 38 cm was applied to the open wound that has a 200 cm surface area.    The deep and superficial fascial layers were closed using #1 Vicryl.  The skin was closed using staples.    The Prevena Peel and Place dressing was applied this was overwrapped with Ioban.  This was connected to the wound VAC pump and had a good suction fit, this was covered with a stump shrinker and a limb protector.  Patient was taken to the PACU in stable condition.   DISCHARGE PLANNING:  Antibiotic duration: 24-hour antibiotics  Weightbearing: Nonweightbearing on the operative extremity  Pain medication: Opioid pathway  Dressing care/ Wound VAC: Continue wound VAC with the Prevena plus pump at discharge for 1 week  Ambulatory devices: Walker or kneeling scooter  Discharge to: Discharge planning based on recommendations per physical therapy  Follow-up: In the office 1 week after discharge.

## 2024-12-11 NOTE — Anesthesia Procedure Notes (Signed)
 Anesthesia Regional Block: Adductor canal block   Pre-Anesthetic Checklist: , timeout performed,  Correct Patient, Correct Site, Correct Laterality,  Correct Procedure, Correct Position, site marked,  Risks and benefits discussed,  Surgical consent,  Pre-op evaluation,  At surgeon's request and post-op pain management  Laterality: Left  Prep: chloraprep       Needles:  Injection technique: Single-shot  Needle Type: Echogenic Stimulator Needle     Needle Length: 9cm  Needle Gauge: 21     Additional Needles:   Procedures:,,,, ultrasound used (permanent image in chart),,    Narrative:  Start time: 12/11/2024 7:59 AM End time: 12/11/2024 8:01 AM Injection made incrementally with aspirations every 5 mL.  Performed by: Personally  Anesthesiologist: Peggye Delon Brunswick, MD  Additional Notes: Discussed risks and benefits of nerve block including, but not limited to, prolonged and/or permanent nerve injury involving sensory and/or motor function. Monitors were applied and a time-out was performed. The nerve and associated structures were visualized under ultrasound guidance. After negative aspiration, local anesthetic was slowly injected around the nerve. There was no evidence of high pressure during the procedure. There were no paresthesias. VSS remained stable and the patient tolerated the procedure well.

## 2024-12-11 NOTE — Anesthesia Postprocedure Evaluation (Signed)
"   Anesthesia Post Note  Patient: Shane [name]  Procedure(s) Performed: AMPUTATION BELOW KNEE (Left: Knee) APPLICATION, WOUND VAC (Left)     Patient location during evaluation: PACU Anesthesia Type: Regional and MAC Level of consciousness: awake Pain management: pain level controlled Vital Signs Assessment: post-procedure vital signs reviewed and stable Respiratory status: spontaneous breathing, nonlabored ventilation and respiratory function stable Cardiovascular status: stable and blood pressure returned to baseline Postop Assessment: no apparent nausea or vomiting Anesthetic complications: no   No notable events documented.  Last Vitals:  Vitals:   12/11/24 1000 12/11/24 1015  BP: (!) 93/55 (!) 100/58  Pulse: 60 60  Resp: 12 20  Temp:    SpO2: 93% 97%    Last Pain:  Vitals:   12/11/24 1015  TempSrc:   PainSc: 5                  Shane [name]      "

## 2024-12-11 NOTE — Transfer of Care (Signed)
 Immediate Anesthesia Transfer of Care Note  Patient: Shane [name]  Procedure(s) Performed: AMPUTATION BELOW KNEE (Left: Knee) APPLICATION, WOUND VAC (Left)  Patient Location: PACU  Anesthesia Type:MAC  Level of Consciousness: awake, alert , and oriented  Airway & Oxygen Therapy: Patient Spontanous Breathing  Post-op Assessment: Report given to RN and Post -op Vital signs reviewed and stable  Post vital signs: Reviewed and stable  Last Vitals:  Vitals Value Taken Time  BP 118/57 12/11/24 09:30  Temp 36.6 C 12/11/24 09:24  Pulse 59 12/11/24 09:35  Resp 17 12/11/24 09:35  SpO2 93 % 12/11/24 09:35  Vitals shown include unfiled device data.  Last Pain:  Vitals:   12/11/24 0924  TempSrc:   PainSc: 0-No pain      Patients Stated Pain Goal: 3 (12/11/24 0727)  Complications: No notable events documented.

## 2024-12-11 NOTE — Progress Notes (Signed)
 Orthopedic Tech Progress Note Patient Details:  Shane [name] 1941/06/16 [id number]  Patient has VIVE PROTOCOL   Patient ID: Shane [name], male   DOB: [birth year], 84 y.o.   MRN: [id number]  Shane [name] 12/11/2024, 2:02 PM

## 2024-12-12 ENCOUNTER — Encounter (HOSPITAL_COMMUNITY): Payer: Self-pay | Admitting: Orthopedic Surgery

## 2024-12-12 ENCOUNTER — Inpatient Hospital Stay (HOSPITAL_COMMUNITY)

## 2024-12-12 DIAGNOSIS — I998 Other disorder of circulatory system: Secondary | ICD-10-CM | POA: Diagnosis not present

## 2024-12-12 DIAGNOSIS — R578 Other shock: Secondary | ICD-10-CM | POA: Diagnosis not present

## 2024-12-12 LAB — GLUCOSE, CAPILLARY
Glucose-Capillary: 130 mg/dL — ABNORMAL HIGH (ref 70–99)
Glucose-Capillary: 135 mg/dL — ABNORMAL HIGH (ref 70–99)
Glucose-Capillary: 155 mg/dL — ABNORMAL HIGH (ref 70–99)
Glucose-Capillary: 169 mg/dL — ABNORMAL HIGH (ref 70–99)
Glucose-Capillary: 224 mg/dL — ABNORMAL HIGH (ref 70–99)

## 2024-12-12 LAB — LACTIC ACID, PLASMA
Lactic Acid, Venous: 2.6 mmol/L (ref 0.5–1.9)
Lactic Acid, Venous: 1.6 mmol/L (ref 0.5–1.9)

## 2024-12-12 LAB — CBC
HCT: 27.6 % — ABNORMAL LOW (ref 39.0–52.0)
HCT: 27.4 % — ABNORMAL LOW (ref 39.0–52.0)
Hemoglobin: 9.3 g/dL — ABNORMAL LOW (ref 13.0–17.0)
Hemoglobin: 9 g/dL — ABNORMAL LOW (ref 13.0–17.0)
MCH: 30.1 pg (ref 26.0–34.0)
MCH: 30.2 pg (ref 26.0–34.0)
MCHC: 33.7 g/dL (ref 30.0–36.0)
MCHC: 32.8 g/dL (ref 30.0–36.0)
MCV: 89.3 fL (ref 80.0–100.0)
MCV: 91.9 fL (ref 80.0–100.0)
Platelets: 166 K/uL (ref 150–400)
Platelets: 151 K/uL (ref 150–400)
RBC: 3.09 MIL/uL — ABNORMAL LOW (ref 4.22–5.81)
RBC: 2.98 MIL/uL — ABNORMAL LOW (ref 4.22–5.81)
RDW: 14.6 % (ref 11.5–15.5)
RDW: 14.6 % (ref 11.5–15.5)
WBC: 11.8 K/uL — ABNORMAL HIGH (ref 4.0–10.5)
WBC: 13 K/uL — ABNORMAL HIGH (ref 4.0–10.5)
nRBC: 0 % (ref 0.0–0.2)
nRBC: 0 % (ref 0.0–0.2)

## 2024-12-12 LAB — TYPE AND SCREEN
ABO/RH(D): O POS
Antibody Screen: NEGATIVE

## 2024-12-12 LAB — BASIC METABOLIC PANEL WITH GFR
Anion gap: 9 (ref 5–15)
BUN: 60 mg/dL — ABNORMAL HIGH (ref 8–23)
CO2: 27 mmol/L (ref 22–32)
Calcium: 8.3 mg/dL — ABNORMAL LOW (ref 8.9–10.3)
Chloride: 96 mmol/L — ABNORMAL LOW (ref 98–111)
Creatinine, Ser: 2.57 mg/dL — ABNORMAL HIGH (ref 0.61–1.24)
GFR, Estimated: 24 mL/min — ABNORMAL LOW
Glucose, Bld: 138 mg/dL — ABNORMAL HIGH (ref 70–99)
Potassium: 4.7 mmol/L (ref 3.5–5.1)
Sodium: 131 mmol/L — ABNORMAL LOW (ref 135–145)

## 2024-12-12 LAB — ECHOCARDIOGRAM COMPLETE
AV Mean grad: 1 mmHg
AV Peak grad: 2.7 mmHg
Ao pk vel: 0.82 m/s
Area-P 1/2: 5.34 cm2
Calc EF: 39.3 %
Height: 72 in
S' Lateral: 4.1 cm
Single Plane A2C EF: 49.3 %
Single Plane A4C EF: 33.2 %
Weight: 3648 [oz_av]

## 2024-12-12 LAB — MRSA NEXT GEN BY PCR, NASAL: MRSA by PCR Next Gen: NOT DETECTED

## 2024-12-12 LAB — CORTISOL: Cortisol, Plasma: 9.1 ug/dL

## 2024-12-12 MED ORDER — NOREPINEPHRINE 4 MG/250ML-% IV SOLN
INTRAVENOUS | Status: AC
  Administered 2024-12-12: 2 ug/min via INTRAVENOUS
  Filled 2024-12-12: qty 250

## 2024-12-12 MED ORDER — SODIUM CHLORIDE 0.9 % IV SOLN: 250.0000 mL | INTRAVENOUS | Status: AC

## 2024-12-12 MED ORDER — SODIUM CHLORIDE 0.9 % IV BOLUS
500.0000 mL | Freq: Once | INTRAVENOUS | Status: AC | PRN
  Administered 2024-12-12: 500 mL via INTRAVENOUS

## 2024-12-12 MED ORDER — LACTATED RINGERS IV BOLUS
1000.0000 mL | Freq: Once | INTRAVENOUS | Status: AC
  Administered 2024-12-12: 1000 mL via INTRAVENOUS

## 2024-12-12 MED ORDER — SODIUM CHLORIDE 0.9% IV SOLUTION: Freq: Once | INTRAVENOUS | Status: DC

## 2024-12-12 MED ORDER — CHLORHEXIDINE GLUCONATE CLOTH 2 % EX PADS
6.0000 | MEDICATED_PAD | Freq: Every day | CUTANEOUS | Status: DC
  Administered 2024-12-12 – 2024-12-25 (×14): 6 via TOPICAL

## 2024-12-12 MED ORDER — PERFLUTREN LIPID MICROSPHERE
1.0000 mL | INTRAVENOUS | Status: AC | PRN
  Administered 2024-12-12: 2 mL via INTRAVENOUS

## 2024-12-12 MED ORDER — ALBUMIN HUMAN 25 % IV SOLN
25.0000 g | Freq: Four times a day (QID) | INTRAVENOUS | Status: AC
  Administered 2024-12-12 – 2024-12-13 (×4): 25 g via INTRAVENOUS
  Filled 2024-12-12 (×4): qty 100

## 2024-12-12 MED ORDER — NOREPINEPHRINE 4 MG/250ML-% IV SOLN
0.0000 ug/min | INTRAVENOUS | Status: DC
  Administered 2024-12-12: 10 ug/min via INTRAVENOUS
  Administered 2024-12-13: 9 ug/min via INTRAVENOUS
  Administered 2024-12-13: 7 ug/min via INTRAVENOUS
  Administered 2024-12-14: 9 ug/min via INTRAVENOUS
  Administered 2024-12-14 (×2): 10 ug/min via INTRAVENOUS
  Filled 2024-12-12 (×7): qty 250

## 2024-12-12 NOTE — Progress Notes (Signed)
 Patient ID: Shane [name], male   DOB: 02-25-1941, 84 y.o.   MRN: [id number] Patient is postoperative day 1 transtibial amputation.  Patient received pain medicine last night.  He did have a block for surgery.  Patient has been hypotensive since his treatment with pain medication.  Rapid response provided a 500 cc bolus last night.  There is 50 cc in the wound VAC canister no evidence of active bleeding.  I placed an order for type and cross with the blood work pending this morning.  I have requested hospitalist consult for medical management of this complex patient.  I have discontinued his Eliquis  and his blood pressure medication.

## 2024-12-12 NOTE — Consult Note (Signed)
 Initial Consultation Note   Patient: Shane [name] FMW:[id number] DOB: [birth year] PCP: Shepard Ade, MD DOA: 12/11/2024 DOS: the patient was seen and examined on 12/12/2024 Primary service: Harden Jerona GAILS, MD  Referring physician: Dr. Harden Reason for consult: Hypotension  Assessment/Plan: Assessment and Plan: Hypotension -Suspecting likely polypharmacy. See list of medications received over the past 24h below, while pt was NPO for surgery for most of 1/7 -3g hgb drop noted post-op. Will repeat CBC at 10am -Decreased urine outpt noted and urine appears more concentrated -Pt currently mentating well. Tolerated 500cc earlier this AM by Rapid Response -Ordered another 1L LR bolus, thus far tolerating with sbp trending upwards slowly -Will cautiously continue to provide volume as pt tolerates. If bp fails to adequately improve despite volume resuscitation, may need critical care involvement ADDENDUM: BP failed to increase adequately. Discussed with Critical Care who has since transferred to ICU service  ARF -Cr up to 2.57 this AM, baseline around 1.5 -Suspect renal failure related to hypotension vs ATN with ACEI  -Cont volume per above to correct hypotension -Avoid nephrotoxic agents  -recheck bmet in AM  Hx retroperitoneal hemorrhage -no back pain on exam -Last CT abd from 8/25 demonstrated resolving hematoma at that time -3g hgb drop post-op. Repeating repeat CBC at 10am  DM2 -Glycemic trends stable   Hx chronic HFrEF -Known EF of around 30% -Currently not vol overloaded on exam -Cont IVF as pt tolerates -Will repeat a limited echo  Hx sick sinus syndrome -seems stable  PAD s/p BKA -S/p prior R BKA -Pt is s/p L BKA on 1/7   TRH will continue to follow the patient.  HPI: Shane [name] is a 84 y.o. male with past medical history of CKD3, DM, HTN, HLD, idopathic pulm fibrosis, PAD who was admitted to orthopedic surgery service with nonhealing L foot ulcers. Pt  underwent L BKA on 1/7. During this time and post-op, pt received:   Oxycodone  10mg  then 15mg  at 4p, 8p, respectively.  Dilaudid  IV 1mg  at 2p, 740p, midnight.  Gabapentin  100mg  at 430p, 9p.  Atarax  25mg  at 9pm Robaxin  500mg  at 9pm Fentanyl  at 940am, 10am, noon, 1pm Lisinopril  10mg  at 220pm Lasix  40mg  at 9pm  On the early AM of 1/8, pt found to be markedly hypotensive with sbp in the 50-60's, not responding to 500cc bolus per rapid response RN.  Review of Systems: As mentioned in the history of present illness. All other systems reviewed and are negative. Past Medical History:  Diagnosis Date   Acute blood loss anemia    Anxiety    AV block, Mobitz 1    Cataract    Chronic kidney disease    d/t DM   CKD (chronic kidney disease), stage III (HCC)    COVID-19 virus infection 07/18/2020   Last Assessment & Plan:   Formatting of this note might be different from the original.  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  10/2019 Echo: EF 55-60%, Gr2 DD. Nl RV.    Neuromuscular disorder (HCC)    Neuropathy associated with endocrine disorder    Nonobstructive CAD (coronary artery disease)    a. 2012 Cath: mod, nonobs dzs; b. 10/2016 MV: EF 60%, no ischemia.   OSA on CPAP 05/05/2017   Unattended Home Sleep Test 7/2/813-AHI 38.6/hour, desaturation to 64%, body weight 261 pounds   PONV (postoperative nausea and vomiting)    Postoperative anemia due to acute blood loss 11/07/2020   Postoperative hemorrhagic shock 03/20/2021   Sleep  apnea     uses cpap asked to bring mask and tubing   Past Surgical History:  Procedure Laterality Date   ABDOMINAL AORTOGRAM W/LOWER EXTREMITY N/A 12/10/2020   Procedure: ABDOMINAL AORTOGRAM W/LOWER EXTREMITY;  Surgeon: Magda Debby SAILOR, MD;  Location: [facility] INVASIVE CV LAB;  Service: Cardiovascular;  Laterality: N/A;   AMPUTATION Right 01/22/2021   Procedure: 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;  Surgeon: Harden Jerona GAILS, MD;  Location: [facility] OR;  Service: Orthopedics;  Laterality: Right;   AMPUTATION Right 03/17/2021   Procedure: RIGHT BELOW KNEE AMPUTATION;  Surgeon: Harden Jerona GAILS, MD;  Location: [facility] OR;  Service: Orthopedics;  Laterality: Right;   ANKLE FUSION Right 01/22/2021   Procedure: RIGHT FOOT TIBIOCALCANEAL FUSION;  Surgeon: Harden Jerona GAILS, MD;  Location: [facility] OR;  Service: Orthopedics;  Laterality: Right;   ANTERIOR FUSION CERVICAL SPINE  2012   CARDIAC CATHETERIZATION  2011   CARDIAC CATHETERIZATION N/A 11/09/2016   Procedure: Right Heart Cath;  Surgeon: Victory LELON Sharps, MD;  Location: [facility] INVASIVE CV LAB;  Service: Cardiovascular;  Laterality: N/A;   carpel tunnel     left wrist   CATARACT EXTRACTION     CATARACT EXTRACTION W/ INTRAOCULAR LENS  IMPLANT, BILATERAL  2013   CERVICAL LAMINECTOMY  2012   COLONOSCOPY N/A 01/14/2013   Procedure: COLONOSCOPY;  Surgeon: Norleen SAILOR Kiang, MD;  Location: [facility] ENDOSCOPY;  Service: Endoscopy;  Laterality: N/A;   CORONARY ARTERY BYPASS GRAFT  11/04/2020   LIMA-LAD, SVG-OM1, SVG-PDA (Dr Norleen Exon [facility]) dc 11/18/2020   EYE SURGERY     I & D EXTREMITY Right 02/19/2021   Procedure: RIGHT ANKLE DEBRIDEMENT AND PLACEMENT ANTIBIOTIC BEADS;  Surgeon: Harden Jerona GAILS, MD;  Location: [facility] OR;  Service: Orthopedics;  Laterality: Right;   KNEE SURGERY  1998   left   LEFT HEART CATH AND CORONARY ANGIOGRAPHY N/A 07/10/2020   Procedure: LEFT HEART CATH AND CORONARY ANGIOGRAPHY;  Surgeon: Wonda Sharper, MD;  Location: [facility] INVASIVE CV LAB;  Service: Cardiovascular;  Laterality: N/A;    LUMBAR LAMINECTOMY  2003   LUNG BIOPSY Left 12/26/2016   Procedure: LUNG BIOPSY;  Surgeon: Elspeth JAYSON Millers, MD;  Location: [facility] OR;  Service: Thoracic;  Laterality: Left;   PACEMAKER IMPLANT N/A 03/30/2020   Procedure: PACEMAKER IMPLANT;  Surgeon: Waddell Danelle LELON, MD;  Location: [facility] INVASIVE CV LAB;  Service: Cardiovascular;  Laterality: N/A;   PERIPHERAL VASCULAR INTERVENTION Right 12/10/2020   Procedure: PERIPHERAL VASCULAR INTERVENTION;  Surgeon: Magda Debby SAILOR, MD;  Location: [facility] INVASIVE CV LAB;  Service: Cardiovascular;  Laterality: Right;  SFA   POSTERIOR FUSION CERVICAL SPINE  2012   PPM GENERATOR REMOVAL N/A 12/14/2020   Procedure: PPM GENERATOR REMOVAL;  Surgeon: Waddell Danelle LELON, MD;  Location: [facility] INVASIVE CV LAB;  Service: Cardiovascular;  Laterality: N/A;   TEE WITHOUT CARDIOVERSION N/A 12/11/2020   Procedure: TRANSESOPHAGEAL ECHOCARDIOGRAM (TEE);  Surgeon: Barbaraann Darryle Debby, MD;  Location: [facility] ENDOSCOPY;  Service: Cardiovascular;  Laterality: N/A;   TRIGGER FINGER RELEASE  2011   4th finger left hand   VIDEO ASSISTED THORACOSCOPY Left 12/26/2016   Procedure: VIDEO ASSISTED THORACOSCOPY;  Surgeon: Elspeth JAYSON Millers, MD;  Location: [facility] OR;  Service: Thoracic;  Laterality: Left;   VIDEO BRONCHOSCOPY N/A 12/26/2016   Procedure: VIDEO BRONCHOSCOPY;  Surgeon: Elspeth JAYSON Millers, MD;  Location: [facility] OR;  Service: Thoracic;  Laterality: N/A;   Social History:  reports that he has never smoked. He has been exposed to tobacco smoke. He has never used smokeless tobacco. He reports that he does not drink alcohol and does not use drugs.  Allergies[1]  Family History  Problem Relation Age of Onset   Diabetes Mellitus II Mother    Emphysema Father 68   Heart attack Father    Colon cancer Neg Hx    Esophageal cancer Neg Hx    Rectal cancer Neg Hx    Stomach cancer Neg Hx     Prior to Admission medications  Medication Sig Start Date End Date Taking? Authorizing Provider  acetaminophen   (TYLENOL ) 500 MG tablet Take 1,000 mg by mouth every 8 (eight) hours as needed for mild pain (pain score 1-3) or moderate pain (pain score 4-6).   Yes [provider]  albuterol  (VENTOLIN  HFA) 108 (90 Base) MCG/ACT inhaler Inhale 2 puffs into the lungs every 4 (four) hours as needed for wheezing or shortness of breath. 04/12/21  Yes Angiulli, Toribio PARAS, PA-C  apixaban  (ELIQUIS ) 5 MG TABS tablet Take 5 mg by mouth 2 (two) times daily.   Yes [provider]  Ascorbic Acid  (VITAMIN C ) 1000 MG tablet Take 1,000 mg by mouth daily.   Yes [provider]  atorvastatin  (LIPITOR ) 80 MG tablet TAKE 1 TABLET BY MOUTH EVERY DAY 07/25/22  Yes Crenshaw, Redell RAMAN, MD  diphenhydrAMINE  (BENADRYL ) 50 MG tablet Take 50 mg by mouth at bedtime as needed for itching.   Yes [provider]  docusate sodium  (COLACE) 100 MG capsule Take 1 capsule (100 mg total) by mouth 2 (two) times daily. Patient taking differently: Take 100 mg by mouth at bedtime. 12/29/23  Yes Tobie Yetta HERO, MD  escitalopram  (LEXAPRO ) 10 MG tablet Take 1 tablet (10 mg total) by mouth daily. 04/12/21  Yes Angiulli, Toribio PARAS, PA-C  furosemide  (LASIX ) 20 MG tablet Take 20 mg by mouth daily with lunch.   Yes [provider]  furosemide  (LASIX ) 40 MG tablet Take 1 tablet (40 mg total) by mouth 2 (two) times daily. 04/14/24 04/14/25 Yes Fairy Frames, MD  gabapentin  (NEURONTIN ) 100 MG capsule Take 1 capsule (100 mg total) by mouth 3 (three) times daily. Patient taking differently: Take 100 mg by mouth 3 (three) times daily as needed (Pain). 12/29/23  Yes Tobie Yetta HERO, MD  HUMALOG  KWIKPEN 100 UNIT/ML KwikPen Inject 10-15 Units into the skin 3 (three) times daily. Can take 15-18 Units with meals while on Prednisone  for 3days Patient taking differently: Inject 15-20 Units into the skin 3 (three) times daily with meals. Sliding scale 04/14/24  Yes Fairy Frames, MD  HYDROcodone  bit-homatropine [facility]) 5-1.5 MG/5ML syrup  Take 5 mLs by mouth every 6 (six) hours as needed for cough. 12/29/23  Yes Tobie Yetta HERO, MD  HYDROcodone -acetaminophen  (NORCO/VICODIN) 5-325 MG tablet Take 1 tablet by mouth at bedtime as needed for moderate pain (pain score 4-6) or severe pain (pain score 7-10).   Yes [provider]  hydrOXYzine  (ATARAX ) 25 MG tablet Take  25 mg by mouth at bedtime. 03/06/24  Yes [provider]  ibuprofen (ADVIL) 200 MG tablet Take 400 mg by mouth every 6 (six) hours as needed for moderate pain (pain score 4-6) or mild pain (pain score 1-3).   Yes [provider]  lisinopril  (ZESTRIL ) 10 MG tablet Take 10 mg by mouth daily. 05/08/24  Yes [provider]  LORazepam  (ATIVAN ) 1 MG tablet Take 1 tablet (1 mg total) by mouth at bedtime as needed for sleep. 12/29/23  Yes Tobie Yetta HERO, MD  Magnesium  200 MG TABS Take 200 mg by mouth at bedtime. 03/06/24  Yes [provider]  methocarbamol  (ROBAXIN ) 500 MG tablet Take 1 tablet (500 mg total) by mouth every 8 (eight) hours as needed for muscle spasms. 12/29/23  Yes Tobie Yetta HERO, MD  Multiple Vitamin (MULTIVITAMIN WITH MINERALS) TABS tablet Take 1 tablet by mouth in the morning.   Yes [provider]  Multiple Vitamins-Minerals (ICAPS AREDS FORMULA PO) Take 2 tablets by mouth daily.   Yes [provider]  Nutritional Supplements (FEEDING SUPPLEMENT, NEPRO CARB STEADY,) LIQD Take 237 mLs by mouth 2 (two) times daily between meals. Patient taking differently: Take 237 mLs by mouth 2 (two) times daily between meals. Premier 12/29/23  Yes Patel, Pranav M, MD  Olopatadine  HCl (PATADAY  OP) Place 2 drops into both eyes daily as needed (allergies).   Yes [provider]  OXYGEN Inhale 2 L into the lungs See admin instructions. Inhale 3L of Oxygen everynight at bedside. May uses throughout the day as needed.   Yes [provider]  pantoprazole  (PROTONIX ) 40 MG tablet Take 1 tablet (40 mg total) by mouth 2  (two) times daily. 04/12/21  Yes Angiulli, Daniel J, PA-C  polyethylene glycol powder (GLYCOLAX /MIRALAX ) 17 GM/SCOOP powder Take 17 g by mouth daily as needed for mild constipation or moderate constipation. 11/15/20  Yes [provider]  Probiotic Product (ALIGN) 10 MG CAPS Take 10 mg by mouth daily.   Yes [provider]  traZODone  (DESYREL ) 50 MG tablet Take 75 mg by mouth at bedtime. 05/11/23  Yes [provider]  TRESIBA  FLEXTOUCH 200 UNIT/ML FlexTouch Pen Inject 20 Units into the skin in the morning. 12/04/24  Yes [provider]  zinc  sulfate 220 (50 Zn) MG capsule Take 1 capsule (220 mg total) by mouth daily. 04/12/21  Yes Angiulli, Toribio PARAS, PA-C  albuterol  (PROVENTIL ) (2.5 MG/3ML) 0.083% nebulizer solution Take 2.5 mg by nebulization 2 (two) times daily as needed for wheezing or shortness of breath.    [provider]  benzonatate  (TESSALON ) 100 MG capsule Take 1 capsule (100 mg total) by mouth 3 (three) times daily. Patient taking differently: Take 100 mg by mouth 3 (three) times daily as needed for cough. 12/29/23   Tobie Yetta HERO, MD  dextromethorphan  (DELSYM ) 30 MG/5ML liquid Take 2.5 mLs (15 mg total) by mouth 2 (two) times daily. Patient taking differently: Take 15 mg by mouth daily as needed for cough. 12/29/23   Tobie Yetta HERO, MD  EPINEPHrine  0.3 mg/0.3 mL IJ SOAJ injection Inject 0.3 mg into the muscle as needed for anaphylaxis. 07/26/24   Ghimire, Donalda HERO, MD  mupirocin  ointment (BACTROBAN ) 2 % Apply 1 Application topically daily as needed (Rash). 06/06/23   [provider]  ondansetron  (ZOFRAN ) 4 MG tablet Take 4 mg by mouth as needed for nausea or vomiting. 06/15/23   [provider]  oxyCODONE -acetaminophen  (PERCOCET) 7.5-325 MG tablet Take 1 tablet  by mouth every 4 (four) hours as needed for moderate pain (pain score 4-6) or severe pain (pain score 7-10). 11/14/24   [provider]  triamcinolone cream (KENALOG)  0.1 % Apply 1 Application topically daily as needed (Rash).    [provider]    Physical Exam: Vitals:   12/12/24 0546 12/12/24 0654 12/12/24 0736 12/12/24 0835  BP: (!) 65/31 (!) 62/32 (!) 58/39 (!) 62/44  Pulse: (!) 58  (!) 59   Resp: 18  18   Temp: 98 F (36.7 C)  98.1 F (36.7 C)   TempSrc: Oral     SpO2: 94%  96%   Weight:      Height:       General exam: Awake, laying in bed, in nad Respiratory system: Normal respiratory effort, no wheezing Cardiovascular system: regular rate, s1, s2 Gastrointestinal system: Soft, nondistended, positive BS Central nervous system: CN2-12 grossly intact, strength intact Extremities: s/p BLE amputation Skin: Normal skin turgor, no notable skin lesions seen Psychiatry: Mood normal // no visual hallucinations   Data Reviewed:   Labs reviewed: Na 131, K 4.7, BUN 60, Cr 2.57, WBC 11.8, Hgb 9.3, Plts 166     Family Communication: Pt in room, family at bedside Primary team communication: Dr. Harden  CRITICAL CARE Performed by: Garnette Pelt   Total critical care time: 75 minutes  Critical care time was exclusive of separately billable procedures and treating other patients.  Critical care was necessary to treat or prevent imminent or life-threatening deterioration.  Critical care was time spent personally by me on the following activities: development of treatment plan with patient and/or surrogate as well as nursing, discussions with consultants, evaluation of patient's response to treatment, examination of patient, obtaining history from patient or surrogate, ordering and performing treatments and interventions, ordering and review of laboratory studies, ordering and review of radiographic studies, pulse oximetry and re-evaluation of patient's condition.   Author: Garnette Pelt, MD 12/12/2024 9:06 AM  For on call review [email or web address].      [1]  Allergies Allergen Reactions   Codeine Hives and Itching   Ofev  [Nintedanib ]  Diarrhea    SEVERE DIARRHEA   Pirfenidone  Diarrhea and Other (See Comments)    Esbriet  (Pirfenidone ) causes elevated LFTs. D/C on 06/14/17 and SEVERE DIARRHEA    Amoxicillin Itching

## 2024-12-12 NOTE — Consult Note (Cosign Needed Addendum)
 "  NAME:  Shane [name], MRN:  [id number], DOB:  1941/09/25, LOS: 1 ADMISSION DATE:  12/11/2024, CONSULTATION DATE:  12/12/24 REFERRING MD:  Dr. Harden, CHIEF COMPLAINT:  hypotension   History of Present Illness:  84 year old male with past medical history as below, which is significant for chronic kidney disease stage III, diabetes, hypertension, hyperlipidemia, IPF, OSA on CPAP, and HFrEF with LVEF 30 to 35%.  He presented to [facility] 1/7 after failing conservative outpatient therapy for ischemic lower extremity ulcers and left foot with gangrene.  He underwent transtibial amputation and was subsequently sent to the medical floor for recovery.  In the early a.m. hours 1/8 he became hypotensive which did improve temporarily with crystalloid bolus, however, he again became hypotensive with systolic blood pressures in the 70s and 80s.  PCCM was asked to evaluate on this basis.   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   Significant Hospital Events: Including procedures, antibiotic start and stop dates in addition to other pertinent events   1/7 admit for L transtibial amputation 1/7 1/8 hypotensive  Interim History / Subjective:  No complaints  Objective     Blood pressure (!) 70/47, pulse (!) 59, temperature 98.1 F (36.7 C), resp. rate 18, height 6' (1.829 m), weight 103.4 kg, SpO2 95%.        Intake/Output Summary (Last 24 hours) at 12/12/2024 1030 Last data filed at 12/12/2024 1009 Gross per 24 hour  Intake 3 ml  Output 300 ml  Net -297 ml   Filed Weights   12/11/24 0635  Weight: 103.4 kg    Examination: General: Elderly appearing male in NAD HENT: [place]/AT, PERRL, no JVD Lungs: Clear bilateral breath sounds Cardiovascular: RRR, no MRG Abdomen: Soft, NT, ND Extremities: Surgical dressing in place with no strikethrough. No evidence of bleeding.  Neuro: Alert, oriented, non-focal   Resolved problem list    Assessment and Plan   Hypotension: etiology unclear. No clear evidence for sepsis. Did receive multiple doses of opioids overnight which may be contributing. SBP in the low 100s at baseline. No evidence of post op bleeding. Hemoglobin 9.3. Creatinine up a bit, but clinically he is well perfused.  - Lactic acid 2.6 - Will transfer to ICU out of an abundance of caution - Trend lactate to ensure clearing - Norepinephrine  infusion peripherally. MAP goal 65. - More judicious use of opioids, pain is very well controlled currently.  - Continue IVF for now, low threshold to DC for any SOB considering his EF is 30-35%.  HFrEF LVEF 30-35% on most recent echo.  - Echo being repeated - Doesn't appear to be on  any dedicated home medications - Consider cardiology consult  Atrial fib - mentioned in [facility] cardiology notes but nothing in our EMR.  - Resume Eliquis  when OK with ortho  - Telemetry monitoring  DM - Basal and SSI continue  AKI on CKD - Trend chemistry and urine output  OSA on  CPAP IPF - stable at last OV with MR - at bedtime CPAP - Supplemental O2 as indicated to keep sats> 90%     Labs   CBC: Recent Labs  Lab 12/11/24 0746 12/12/24 0655  WBC 8.9 11.8*  NEUTROABS 6.1  --   HGB 12.0* 9.3*  HCT 36.2*  27.6*  MCV 90.7 89.3  PLT 194 166    Basic Metabolic Panel: Recent Labs  Lab 12/11/24 0746 12/12/24 0655  NA 136 131*  K 3.6 4.7  CL 97* 96*  CO2 26 27  GLUCOSE 137* 138*  BUN 44* 60*  CREATININE 1.41* 2.57*  CALCIUM  9.0 8.3*   GFR: Estimated Creatinine Clearance: 27.1 mL/min (A) (by C-G formula based on SCr of 2.57 mg/dL (H)). Recent Labs  Lab 12/11/24 0746 12/12/24 0655  WBC 8.9 11.8*    Liver Function Tests: Recent Labs  Lab 12/11/24 0746  AST 20  ALT 13  ALKPHOS 124  BILITOT 0.5  PROT 7.1  ALBUMIN  3.6   No results for input(s): LIPASE, AMYLASE in the last 168 hours. No results for input(s): AMMONIA in the last 168 hours.  ABG    Component Value Date/Time   PHART 7.362 12/27/2016 0334   PCO2ART 45.4 12/27/2016 0334   PO2ART 115 (H) 12/27/2016 0334   HCO3 23.2 07/24/2024 0019   TCO2 24 07/24/2024 0019   ACIDBASEDEF 2.0 07/24/2024 0019   O2SAT 100 07/24/2024 0019     Coagulation Profile: No results for input(s): INR, PROTIME in the last 168 hours.  Cardiac Enzymes: No results for input(s): CKTOTAL, CKMB, CKMBINDEX, TROPONINI in the last 168 hours.  HbA1C: Hgb A1c MFr Bld  Date/Time Value Ref Range Status  12/11/2024 02:39 PM 7.6 (H) 4.8 - 5.6 % Final    Comment:    (NOTE) Diagnosis of Diabetes The following HbA1c ranges recommended by the American Diabetes Association (ADA) may be used as an aid in the diagnosis of diabetes mellitus.  Hemoglobin             Suggested A1C NGSP%              Diagnosis  <5.7                   Non Diabetic  5.7-6.4                Pre-Diabetic  >6.4                   Diabetic  <7.0                   Glycemic control for                       adults with diabetes.    04/09/2024 11:38 PM 8.7 (H) 4.8 - 5.6 % Final    Comment:    (NOTE) Pre diabetes:          5.7%-6.4%  Diabetes:              >6.4%  Glycemic control for   <7.0% adults with diabetes     CBG: Recent Labs  Lab  12/11/24 0926 12/11/24 1630 12/11/24 2107 12/12/24 0605 12/12/24 0926  GLUCAP 147* 91 98 130* 135*    Review of Systems:   Bolds are positive  Constitutional: weight loss, gain, night sweats, Fevers, chills, fatigue .  HEENT: headaches, Sore throat, sneezing, nasal congestion, post nasal drip, Difficulty swallowing, Tooth/dental problems, visual  complaints visual changes, ear ache CV:  chest pain, radiates:,Orthopnea, PND, swelling in lower extremities, dizziness, palpitations, syncope.  GI  heartburn, indigestion, abdominal pain, nausea, vomiting, diarrhea, change in bowel habits, loss of appetite, bloody stools.  Resp: cough, productive: , hemoptysis, dyspnea, chest pain, pleuritic.  Skin: rash or itching or icterus GU: dysuria, change in color of urine, urgency or frequency. flank pain, hematuria  MS: joint pain or swelling. decreased range of motion  Psych: change in mood or affect. depression or anxiety.  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   Surgical History:   Past Surgical History:  Procedure Laterality Date   ABDOMINAL AORTOGRAM W/LOWER EXTREMITY N/A 12/10/2020   Procedure:  ABDOMINAL AORTOGRAM W/LOWER EXTREMITY;  Surgeon: Magda Debby SAILOR, MD;  Location: [facility] INVASIVE CV LAB;  Service: Cardiovascular;  Laterality: N/A;   AMPUTATION Right 01/22/2021   Procedure: 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;  Surgeon: Harden Jerona GAILS, MD;  Location[facility] OR;  Service: Orthopedics;  Laterality: Right;   AMPUTATION Right 03/17/2021   Procedure: RIGHT BELOW KNEE AMPUTATION;  Surgeon: Harden Jerona GAILS, MD;  Location: [facility] OR;  Service: Orthopedics;  Laterality: Right;   ANKLE FUSION Right 01/22/2021   Procedure: RIGHT FOOT TIBIOCALCANEAL FUSION;  Surgeon: Harden Jerona GAILS, MD;  Location: [facility] OR;  Service: Orthopedics;  Laterality: Right;   ANTERIOR FUSION CERVICAL SPINE  2012   CARDIAC CATHETERIZATION  2011   CARDIAC CATHETERIZATION N/A 11/09/2016   Procedure: Right Heart Cath;  Surgeon: Victory LELON Sharps, MD;  Location: [facility] INVASIVE CV LAB;  Service: Cardiovascular;  Laterality: N/A;   carpel tunnel     left wrist   CATARACT EXTRACTION     CATARACT EXTRACTION W/ INTRAOCULAR LENS  IMPLANT, BILATERAL  2013   CERVICAL LAMINECTOMY  2012   COLONOSCOPY N/A 01/14/2013   Procedure: COLONOSCOPY;  Surgeon: Norleen SAILOR Kiang, MD;  Location: [facility] ENDOSCOPY;  Service: Endoscopy;  Laterality: N/A;   CORONARY ARTERY BYPASS GRAFT  11/04/2020   LIMA-LAD, SVG-OM1, SVG-PDA (Dr Norleen Exon [facility]) dc 11/18/2020   EYE SURGERY     I & D EXTREMITY Right 02/19/2021   Procedure: RIGHT ANKLE DEBRIDEMENT AND PLACEMENT ANTIBIOTIC BEADS;  Surgeon: Harden Jerona GAILS, MD;  Location: [facility] OR;  Service: Orthopedics;  Laterality: Right;   KNEE SURGERY  1998   left   LEFT HEART CATH AND CORONARY ANGIOGRAPHY N/A 07/10/2020   Procedure: LEFT HEART CATH AND CORONARY ANGIOGRAPHY;  Surgeon: Wonda Sharper, MD;  Location: [facility] INVASIVE CV LAB;  Service: Cardiovascular;  Laterality: N/A;   LUMBAR LAMINECTOMY  2003   LUNG BIOPSY Left 12/26/2016   Procedure: LUNG BIOPSY;  Surgeon: Elspeth JAYSON Millers, MD;  Location: [facility] OR;  Service: Thoracic;  Laterality: Left;    PACEMAKER IMPLANT N/A 03/30/2020   Procedure: PACEMAKER IMPLANT;  Surgeon: Waddell Danelle LELON, MD;  Location: [facility] INVASIVE CV LAB;  Service: Cardiovascular;  Laterality: N/A;  PERIPHERAL VASCULAR INTERVENTION Right 12/10/2020   Procedure: PERIPHERAL VASCULAR INTERVENTION;  Surgeon: Magda Debby SAILOR, MD;  Location: [facility] INVASIVE CV LAB;  Service: Cardiovascular;  Laterality: Right;  SFA   POSTERIOR FUSION CERVICAL SPINE  2012   PPM GENERATOR REMOVAL N/A 12/14/2020   Procedure: PPM GENERATOR REMOVAL;  Surgeon: Waddell Danelle ORN, MD;  Location: [facility] INVASIVE CV LAB;  Service: Cardiovascular;  Laterality: N/A;   TEE WITHOUT CARDIOVERSION N/A 12/11/2020   Procedure: TRANSESOPHAGEAL ECHOCARDIOGRAM (TEE);  Surgeon: Barbaraann Darryle Debby, MD;  Location: [facility] ENDOSCOPY;  Service: Cardiovascular;  Laterality: N/A;   TRIGGER FINGER RELEASE  2011   4th finger left hand   VIDEO ASSISTED THORACOSCOPY Left 12/26/2016   Procedure: VIDEO ASSISTED THORACOSCOPY;  Surgeon: Elspeth JAYSON Millers, MD;  Location: [facility] OR;  Service: Thoracic;  Laterality: Left;   VIDEO BRONCHOSCOPY N/A 12/26/2016   Procedure: VIDEO BRONCHOSCOPY;  Surgeon: Elspeth JAYSON Millers, MD;  Location: [facility] OR;  Service: Thoracic;  Laterality: N/A;     Social History:   reports that he has never smoked. He has been exposed to tobacco smoke. He has never used smokeless tobacco. He reports that he does not drink alcohol and does not use drugs.   Family History:  His family history includes Diabetes Mellitus II in his mother; Emphysema (age of onset: 61) in his father; Heart attack in his father. There is no history of Colon cancer, Esophageal cancer, Rectal cancer, or Stomach cancer.   Allergies Allergies[1]   Home Medications  Prior to Admission medications  Medication Sig Start Date End Date Taking? Authorizing Provider  acetaminophen  (TYLENOL ) 500 MG tablet Take 1,000 mg by mouth every 8 (eight) hours as needed for mild pain (pain score 1-3) or moderate pain  (pain score 4-6).   Yes [provider]  albuterol  (VENTOLIN  HFA) 108 (90 Base) MCG/ACT inhaler Inhale 2 puffs into the lungs every 4 (four) hours as needed for wheezing or shortness of breath. 04/12/21  Yes Angiulli, Toribio PARAS, PA-C  apixaban  (ELIQUIS ) 5 MG TABS tablet Take 5 mg by mouth 2 (two) times daily.   Yes [provider]  Ascorbic Acid  (VITAMIN C ) 1000 MG tablet Take 1,000 mg by mouth daily.   Yes [provider]  atorvastatin  (LIPITOR ) 80 MG tablet TAKE 1 TABLET BY MOUTH EVERY DAY 07/25/22  Yes Crenshaw, Redell RAMAN, MD  diphenhydrAMINE  (BENADRYL ) 50 MG tablet Take 50 mg by mouth at bedtime as needed for itching.   Yes [provider]  docusate sodium  (COLACE) 100 MG capsule Take 1 capsule (100 mg total) by mouth 2 (two) times daily. Patient taking differently: Take 100 mg by mouth at bedtime. 12/29/23  Yes Tobie Yetta HERO, MD  escitalopram  (LEXAPRO ) 10 MG tablet Take 1 tablet (10 mg total) by mouth daily. 04/12/21  Yes Angiulli, Toribio PARAS, PA-C  furosemide  (LASIX ) 20 MG tablet Take 20 mg by mouth daily with lunch.   Yes [provider]  furosemide  (LASIX ) 40 MG tablet Take 1 tablet (40 mg total) by mouth 2 (two) times daily. 04/14/24 04/14/25 Yes Fairy Frames, MD  gabapentin  (NEURONTIN ) 100 MG capsule Take 1 capsule (100 mg total) by mouth 3 (three) times daily. Patient taking differently: Take 100 mg by mouth 3 (three) times daily as needed (Pain). 12/29/23  Yes Tobie Yetta HERO, MD  HUMALOG  KWIKPEN 100 UNIT/ML KwikPen Inject 10-15 Units into the skin 3 (three) times daily. Can take 15-18 Units with meals while on Prednisone  for 3days Patient taking differently:  Inject 15-20 Units into the skin 3 (three) times daily with meals. Sliding scale 04/14/24  Yes Fairy Frames, MD  HYDROcodone  bit-homatropine [facility]) 5-1.5 MG/5ML syrup Take 5 mLs by mouth every 6 (six) hours as needed for cough. 12/29/23  Yes Tobie Yetta HERO, MD  HYDROcodone -acetaminophen   (NORCO/VICODIN) 5-325 MG tablet Take 1 tablet by mouth at bedtime as needed for moderate pain (pain score 4-6) or severe pain (pain score 7-10).   Yes [provider]  hydrOXYzine  (ATARAX ) 25 MG tablet Take 25 mg by mouth at bedtime. 03/06/24  Yes [provider]  ibuprofen (ADVIL) 200 MG tablet Take 400 mg by mouth every 6 (six) hours as needed for moderate pain (pain score 4-6) or mild pain (pain score 1-3).   Yes [provider]  lisinopril  (ZESTRIL ) 10 MG tablet Take 10 mg by mouth daily. 05/08/24  Yes [provider]  LORazepam  (ATIVAN ) 1 MG tablet Take 1 tablet (1 mg total) by mouth at bedtime as needed for sleep. 12/29/23  Yes Tobie Yetta HERO, MD  Magnesium  200 MG TABS Take 200 mg by mouth at bedtime. 03/06/24  Yes [provider]  methocarbamol  (ROBAXIN ) 500 MG tablet Take 1 tablet (500 mg total) by mouth every 8 (eight) hours as needed for muscle spasms. 12/29/23  Yes Tobie Yetta HERO, MD  Multiple Vitamin (MULTIVITAMIN WITH MINERALS) TABS tablet Take 1 tablet by mouth in the morning.   Yes [provider]  Multiple Vitamins-Minerals (ICAPS AREDS FORMULA PO) Take 2 tablets by mouth daily.   Yes [provider]  Nutritional Supplements (FEEDING SUPPLEMENT, NEPRO CARB STEADY,) LIQD Take 237 mLs by mouth 2 (two) times daily between meals. Patient taking differently: Take 237 mLs by mouth 2 (two) times daily between meals. Premier 12/29/23  Yes Patel, Pranav M, MD  Olopatadine  HCl (PATADAY  OP) Place 2 drops into both eyes daily as needed (allergies).   Yes [provider]  OXYGEN Inhale 2 L into the lungs See admin instructions. Inhale 3L of Oxygen everynight at bedside. May uses throughout the day as needed.   Yes [provider]  pantoprazole  (PROTONIX ) 40 MG tablet Take 1 tablet (40 mg total) by mouth 2 (two) times daily. 04/12/21  Yes Angiulli, Daniel J, PA-C  polyethylene glycol powder (GLYCOLAX /MIRALAX ) 17 GM/SCOOP powder  Take 17 g by mouth daily as needed for mild constipation or moderate constipation. 11/15/20  Yes [provider]  Probiotic Product (ALIGN) 10 MG CAPS Take 10 mg by mouth daily.   Yes [provider]  traZODone  (DESYREL ) 50 MG tablet Take 75 mg by mouth at bedtime. 05/11/23  Yes [provider]  TRESIBA  FLEXTOUCH 200 UNIT/ML FlexTouch Pen Inject 20 Units into the skin in the morning. 12/04/24  Yes [provider]  zinc  sulfate 220 (50 Zn) MG capsule Take 1 capsule (220 mg total) by mouth daily. 04/12/21  Yes Angiulli, Toribio PARAS, PA-C  albuterol  (PROVENTIL ) (2.5 MG/3ML) 0.083% nebulizer solution Take 2.5 mg by nebulization 2 (two) times daily as needed for wheezing or shortness of breath.    [provider]  benzonatate  (TESSALON ) 100 MG capsule Take 1 capsule (100 mg total) by mouth 3 (three) times daily. Patient taking differently: Take 100 mg by mouth 3 (three) times daily as needed for cough. 12/29/23   Tobie Yetta HERO, MD  dextromethorphan  (DELSYM ) 30 MG/5ML liquid Take 2.5 mLs (15 mg total) by mouth 2 (two) times daily. Patient taking differently: Take 15 mg by mouth daily  as needed for cough. 12/29/23   Tobie Yetta HERO, MD  EPINEPHrine  0.3 mg/0.3 mL IJ SOAJ injection Inject 0.3 mg into the muscle as needed for anaphylaxis. 07/26/24   Ghimire, Donalda HERO, MD  mupirocin  ointment (BACTROBAN ) 2 % Apply 1 Application topically daily as needed (Rash). 06/06/23   [provider]  ondansetron  (ZOFRAN ) 4 MG tablet Take 4 mg by mouth as needed for nausea or vomiting. 06/15/23   [provider]  oxyCODONE -acetaminophen  (PERCOCET) 7.5-325 MG tablet Take 1 tablet by mouth every 4 (four) hours as needed for moderate pain (pain score 4-6) or severe pain (pain score 7-10). 11/14/24   [provider]  triamcinolone cream (KENALOG) 0.1 % Apply 1 Application topically daily as needed (Rash).    [provider]     Critical care time:       Deward Eastern, AGACNP-BC [facility]  See Amion for personal pager PCCM on call pager [phone] until 7pm. Please call Elink 7p-7a. [phone]  12/12/2024 10:48 AM              [1]  Allergies Allergen Reactions   Codeine Hives and Itching   Ofev  [Nintedanib ] Diarrhea    SEVERE DIARRHEA   Pirfenidone  Diarrhea and Other (See Comments)    Esbriet  (Pirfenidone ) causes elevated LFTs. D/C on 06/14/17 and SEVERE DIARRHEA    Amoxicillin Itching   "

## 2024-12-12 NOTE — Progress Notes (Signed)
 Patients BP as noted below on arrival to shift this AM. Night nurse administered 500 mL bolus with no improvement. MD made aware. 2x 1L bolus ordered to total 2000 mL additional bolus (total of 2.5L administered). Patient EF low; monitoring closely. SOB presented after first liter of LR bolus. 2.5L applied to patient with resolution to SOB. Following second 1L LR bolus, patient showed no improvements in BP/MAP. LA critical at 2.6. Critical care MD and NP made aware, as well as hospitalist; see flowsheets under notifications. Patient transfer order signed.   12/12/24 0736  Vitals  BP (!) 58/39  MAP (mmHg) (!) 47  BP Location Right Arm  BP Method Automatic  Patient Position (if appropriate) Lying  Pulse Rate (!) 59

## 2024-12-12 NOTE — Plan of Care (Signed)
 " Problem: Education: Goal: Knowledge of the prescribed therapeutic regimen will improve Outcome: Progressing   Problem: Bowel/Gastric: Goal: Gastrointestinal status for postoperative course will improve Outcome: Progressing   Problem: Cardiac: Goal: Ability to maintain an adequate cardiac output Outcome: Progressing Goal: Will show no evidence of cardiac arrhythmias Outcome: Progressing   Problem: Nutritional: Goal: Will attain and maintain optimal nutritional status Outcome: Progressing   Problem: Neurological: Goal: Will regain or maintain usual level of consciousness Outcome: Progressing   Problem: Clinical Measurements: Goal: Postoperative complications will be avoided or minimized Outcome: Progressing   Problem: Respiratory: Goal: Will regain and/or maintain adequate ventilation Outcome: Progressing Goal: Respiratory status will improve Outcome: Progressing   Problem: Skin Integrity: Goal: Demonstrates signs of wound healing without infection Outcome: Progressing   Problem: Urinary Elimination: Goal: Will remain free from infection Outcome: Progressing Goal: Ability to achieve and maintain adequate urine output Outcome: Progressing   Problem: Education: Goal: Knowledge of General Education information will improve Description: Including pain rating scale, medication(s)/side effects and non-pharmacologic comfort measures Outcome: Progressing   Problem: Health Behavior/Discharge Planning: Goal: Ability to manage health-related needs will improve Outcome: Progressing   Problem: Clinical Measurements: Goal: Will remain free from infection Outcome: Progressing Goal: Diagnostic test results will improve Outcome: Progressing Goal: Respiratory complications will improve Outcome: Progressing Goal: Cardiovascular complication will be avoided Outcome: Progressing   Problem: Activity: Goal: Risk for activity intolerance will decrease Outcome: Progressing    Problem: Nutrition: Goal: Adequate nutrition will be maintained Outcome: Progressing   Problem: Coping: Goal: Level of anxiety will decrease Outcome: Progressing   Problem: Elimination: Goal: Will not experience complications related to bowel motility Outcome: Progressing Goal: Will not experience complications related to urinary retention Outcome: Progressing   Problem: Pain Managment: Goal: General experience of comfort will improve and/or be controlled Outcome: Progressing   Problem: Safety: Goal: Ability to remain free from injury will improve Outcome: Progressing   Problem: Skin Integrity: Goal: Risk for impaired skin integrity will decrease Outcome: Progressing   Problem: Education: Goal: Ability to describe self-care measures that may prevent or decrease complications (Diabetes Survival Skills Education) will improve Outcome: Progressing   Problem: Coping: Goal: Ability to adjust to condition or change in health will improve Outcome: Progressing   Problem: Fluid Volume: Goal: Ability to maintain a balanced intake and output will improve Outcome: Progressing   Problem: Health Behavior/Discharge Planning: Goal: Ability to identify and utilize available resources and services will improve Outcome: Progressing Goal: Ability to manage health-related needs will improve Outcome: Progressing   Problem: Metabolic: Goal: Ability to maintain appropriate glucose levels will improve Outcome: Progressing   Problem: Nutritional: Goal: Maintenance of adequate nutrition will improve Outcome: Progressing Goal: Progress toward achieving an optimal weight will improve Outcome: Progressing   Problem: Skin Integrity: Goal: Risk for impaired skin integrity will decrease Outcome: Progressing   Problem: Tissue Perfusion: Goal: Adequacy of tissue perfusion will improve Outcome: Progressing   Problem: Education: Goal: Knowledge of the prescribed therapeutic regimen will  improve Outcome: Progressing Goal: Ability to verbalize activity precautions or restrictions will improve Outcome: Progressing Goal: Understanding of discharge needs will improve Outcome: Progressing   Problem: Activity: Goal: Ability to perform//tolerate increased activity and mobilize with assistive devices will improve Outcome: Progressing   Problem: Clinical Measurements: Goal: Postoperative complications will be avoided or minimized Outcome: Progressing   Problem: Self-Care: Goal: Ability to meet self-care needs will improve Outcome: Progressing   Problem: Self-Concept: Goal: Ability to maintain and perform role responsibilities to the  fullest extent possible will improve Outcome: Progressing   Problem: Pain Management: Goal: Pain level will decrease with appropriate interventions Outcome: Progressing   Problem: Skin Integrity: Goal: Demonstration of wound healing without infection will improve Outcome: Progressing   Problem: Clinical Measurements: Goal: Ability to maintain clinical measurements within normal limits Outcome: Not Progressing Note: Treating hypotension   Problem: Clinical Measurements: Goal: Ability to maintain clinical measurements within normal limits will improve Outcome: Not Progressing Note: Treating hypotension   "

## 2024-12-12 NOTE — Progress Notes (Signed)
 OT Cancellation Note  Patient Details Name: Shane [name] MRN: [id number] DOB: December 04, 1941   Cancelled Treatment:    Reason Eval/Treat Not Completed: Medical issues which prohibited therapy Pt with persistent hypotension and unable to engage in OT eval today. Will follow up tomorrow.  Mliss Fish 12/12/2024, 9:26 AM

## 2024-12-12 NOTE — Progress Notes (Signed)
 Inpatient Rehab Admissions Coordinator:   Consult received and chart reviewed.  Therapy on hold today due to persistent hypotension.  Pt is familiar to me from previous CIR admission for R BKA.  If therapy feels he is appropriate for CIR therapy I do believe we could try and get auth.  Will follow for therapy assessments.   Reche Lowers, PT, DPT Admissions Coordinator [phone] 12/12/2024 10:21 AM

## 2024-12-12 NOTE — Significant Event (Signed)
 Rapid Response Event Note   Reason for Call :  Hypotension-60s  Initial Focused Assessment:  Pt lying in bed with eyes closed, in no visible distress. He is sleepy but awakens easily and is able to answers. He denies CP/SOB/dizziness. Lungs are clear t/o. Skin warm and diaphoretic.   T-98, HR-58, BP-65/31, RR-18, SpO2-94% with 2Ls bled in  Interventions:  500cc NS bolus CBG-130 CBC/BMP already ordered as AM labs.  Plan of Care:  Give bolus and recheck VS. Await AM lab results. Continue to monitor pt closely. Please call RRT if further assistance needed.   Event Summary:   MD Notified: per RN Call [phone] Arrival [phone] End [id number]  Tish Graeme Piety, RN

## 2024-12-12 NOTE — Progress Notes (Signed)
 Patient had low blood pressure of 65/31 map of 43.Rapid nurse informed who ordered 500 mls of NS bolus,after giving the bolus BP is still low,62/32,MAP of 48.Dr Harden came early this morning to the patients room and was informed of the patients condition.He gave a verbal order to stop all blood pressure meds and hold pain pills.

## 2024-12-12 NOTE — Progress Notes (Signed)
 LA 2.6; will notify CC MD and hospitalist.

## 2024-12-12 NOTE — Progress Notes (Signed)
 PT Cancellation Note  Patient Details Name: Shane [name] MRN: [id number] DOB: [birth year]   Cancelled Treatment:    Reason Eval/Treat Not Completed: Medical issues which prohibited therapy. Pt with persistent hypotension and unable to participate in PT eval today. Will attempt evaluation tomorrow per RN request.    Shane [name] Shane Stains PT, DPT 12/12/2024, 9:32 AM

## 2024-12-13 ENCOUNTER — Inpatient Hospital Stay (HOSPITAL_COMMUNITY)

## 2024-12-13 DIAGNOSIS — I495 Sick sinus syndrome: Secondary | ICD-10-CM

## 2024-12-13 DIAGNOSIS — N183 Chronic kidney disease, stage 3 unspecified: Secondary | ICD-10-CM

## 2024-12-13 DIAGNOSIS — G9341 Metabolic encephalopathy: Secondary | ICD-10-CM

## 2024-12-13 LAB — GLUCOSE, CAPILLARY
Glucose-Capillary: 173 mg/dL — ABNORMAL HIGH (ref 70–99)
Glucose-Capillary: 183 mg/dL — ABNORMAL HIGH (ref 70–99)
Glucose-Capillary: 187 mg/dL — ABNORMAL HIGH (ref 70–99)
Glucose-Capillary: 213 mg/dL — ABNORMAL HIGH (ref 70–99)
Glucose-Capillary: 216 mg/dL — ABNORMAL HIGH (ref 70–99)
Glucose-Capillary: 222 mg/dL — ABNORMAL HIGH (ref 70–99)

## 2024-12-13 LAB — URINALYSIS, ROUTINE W REFLEX MICROSCOPIC
Bacteria, UA: NONE SEEN
Bilirubin Urine: NEGATIVE
Glucose, UA: NEGATIVE mg/dL
Hgb urine dipstick: NEGATIVE
Ketones, ur: NEGATIVE mg/dL
Leukocytes,Ua: NEGATIVE
Nitrite: NEGATIVE
Protein, ur: 100 mg/dL — AB
Specific Gravity, Urine: 1.015 (ref 1.005–1.030)
pH: 5 (ref 5.0–8.0)

## 2024-12-13 LAB — BASIC METABOLIC PANEL WITH GFR
Anion gap: 12 (ref 5–15)
BUN: 67 mg/dL — ABNORMAL HIGH (ref 8–23)
CO2: 26 mmol/L (ref 22–32)
Calcium: 8.4 mg/dL — ABNORMAL LOW (ref 8.9–10.3)
Chloride: 90 mmol/L — ABNORMAL LOW (ref 98–111)
Creatinine, Ser: 2.62 mg/dL — ABNORMAL HIGH (ref 0.61–1.24)
GFR, Estimated: 24 mL/min — ABNORMAL LOW
Glucose, Bld: 243 mg/dL — ABNORMAL HIGH (ref 70–99)
Potassium: 4.7 mmol/L (ref 3.5–5.1)
Sodium: 128 mmol/L — ABNORMAL LOW (ref 135–145)

## 2024-12-13 LAB — CBC
HCT: 28.7 % — ABNORMAL LOW (ref 39.0–52.0)
Hemoglobin: 9.7 g/dL — ABNORMAL LOW (ref 13.0–17.0)
MCH: 30.4 pg (ref 26.0–34.0)
MCHC: 33.8 g/dL (ref 30.0–36.0)
MCV: 90 fL (ref 80.0–100.0)
Platelets: 180 K/uL (ref 150–400)
RBC: 3.19 MIL/uL — ABNORMAL LOW (ref 4.22–5.81)
RDW: 14.5 % (ref 11.5–15.5)
WBC: 15.4 K/uL — ABNORMAL HIGH (ref 4.0–10.5)
nRBC: 0 % (ref 0.0–0.2)

## 2024-12-13 LAB — BLOOD GAS, VENOUS
Acid-Base Excess: 0.3 mmol/L (ref 0.0–2.0)
Bicarbonate: 26.5 mmol/L (ref 20.0–28.0)
Drawn by: 71468
O2 Saturation: 79.6 %
Patient temperature: 37.6
pCO2, Ven: 49 mmHg (ref 44–60)
pH, Ven: 7.34 (ref 7.25–7.43)
pO2, Ven: 49 mmHg — ABNORMAL HIGH (ref 32–45)

## 2024-12-13 LAB — PHOSPHORUS: Phosphorus: 4.2 mg/dL (ref 2.5–4.6)

## 2024-12-13 LAB — MAGNESIUM: Magnesium: 1.5 mg/dL — ABNORMAL LOW (ref 1.7–2.4)

## 2024-12-13 MED ORDER — ESCITALOPRAM OXALATE 10 MG PO TABS
10.0000 mg | ORAL_TABLET | Freq: Every day | ORAL | Status: DC
  Administered 2024-12-14 – 2024-12-15 (×2): 10 mg
  Filled 2024-12-13 (×2): qty 1

## 2024-12-13 MED ORDER — OXYCODONE HCL 5 MG PO TABS: 5.0000 mg | ORAL_TABLET | ORAL | Status: DC | PRN

## 2024-12-13 MED ORDER — ADULT MULTIVITAMIN W/MINERALS CH
1.0000 | ORAL_TABLET | Freq: Every morning | ORAL | Status: DC
  Administered 2024-12-14 – 2024-12-24 (×11): 1
  Filled 2024-12-13 (×11): qty 1

## 2024-12-13 MED ORDER — FERROUS SULFATE 220 (44 FE) MG/5ML PO SOLN
220.0000 mg | Freq: Every day | ORAL | Status: DC
  Filled 2024-12-13: qty 5

## 2024-12-13 MED ORDER — INSULIN ASPART 100 UNIT/ML IJ SOLN
0.0000 [IU] | INTRAMUSCULAR | Status: DC
  Administered 2024-12-13 (×2): 4 [IU] via SUBCUTANEOUS
  Administered 2024-12-13: 7 [IU] via SUBCUTANEOUS
  Administered 2024-12-13: 4 [IU] via SUBCUTANEOUS
  Administered 2024-12-14: 15 [IU] via SUBCUTANEOUS
  Administered 2024-12-14: 11 [IU] via SUBCUTANEOUS
  Administered 2024-12-14: 7 [IU] via SUBCUTANEOUS
  Administered 2024-12-14 (×3): 15 [IU] via SUBCUTANEOUS
  Administered 2024-12-15: 20 [IU] via SUBCUTANEOUS
  Administered 2024-12-15: 15 [IU] via SUBCUTANEOUS
  Filled 2024-12-13 (×2): qty 15
  Filled 2024-12-13: qty 20
  Filled 2024-12-13: qty 7
  Filled 2024-12-13: qty 15
  Filled 2024-12-13: qty 4
  Filled 2024-12-13: qty 11
  Filled 2024-12-13: qty 15
  Filled 2024-12-13: qty 7
  Filled 2024-12-13: qty 4
  Filled 2024-12-13: qty 15
  Filled 2024-12-13: qty 4

## 2024-12-13 MED ORDER — HYDROXYZINE HCL 25 MG PO TABS: 25.0000 mg | ORAL_TABLET | Freq: Every evening | ORAL | Status: DC | PRN

## 2024-12-13 MED ORDER — MIDODRINE HCL 5 MG PO TABS
10.0000 mg | ORAL_TABLET | Freq: Three times a day (TID) | ORAL | Status: DC
  Administered 2024-12-13 – 2024-12-16 (×10): 10 mg
  Filled 2024-12-13 (×10): qty 2

## 2024-12-13 MED ORDER — DOCUSATE SODIUM 50 MG/5ML PO LIQD
100.0000 mg | Freq: Every day | ORAL | Status: DC
  Administered 2024-12-14 – 2024-12-15 (×2): 100 mg
  Filled 2024-12-13: qty 10

## 2024-12-13 MED ORDER — METHOCARBAMOL 500 MG PO TABS: 500.0000 mg | ORAL_TABLET | Freq: Three times a day (TID) | ORAL | Status: DC | PRN

## 2024-12-13 MED ORDER — FENTANYL CITRATE (PF) 50 MCG/ML IJ SOSY: 25.0000 ug | PREFILLED_SYRINGE | INTRAMUSCULAR | Status: DC | PRN

## 2024-12-13 MED ORDER — INSULIN ASPART 100 UNIT/ML IJ SOLN: 0.0000 [IU] | Freq: Three times a day (TID) | INTRAMUSCULAR | Status: DC

## 2024-12-13 MED ORDER — VITAL 1.5 CAL PO LIQD
1000.0000 mL | ORAL | Status: DC
  Administered 2024-12-13 – 2024-12-14 (×2): 1000 mL

## 2024-12-13 MED ORDER — HEPARIN SODIUM (PORCINE) 5000 UNIT/ML IJ SOLN
5000.0000 [IU] | Freq: Three times a day (TID) | INTRAMUSCULAR | Status: DC
  Administered 2024-12-13 – 2024-12-16 (×9): 5000 [IU] via SUBCUTANEOUS
  Filled 2024-12-13 (×9): qty 1

## 2024-12-13 MED ORDER — GABAPENTIN 250 MG/5ML PO SOLN
100.0000 mg | Freq: Three times a day (TID) | ORAL | Status: DC
  Administered 2024-12-13 – 2024-12-19 (×19): 100 mg
  Filled 2024-12-13 (×23): qty 2

## 2024-12-13 MED ORDER — JUVEN PO PACK
1.0000 | PACK | Freq: Two times a day (BID) | ORAL | Status: DC
  Administered 2024-12-14 – 2024-12-25 (×22): 1
  Filled 2024-12-13 (×20): qty 1

## 2024-12-13 MED ORDER — BISACODYL 10 MG RE SUPP
10.0000 mg | Freq: Once | RECTAL | Status: AC
  Administered 2024-12-13: 10 mg via RECTAL
  Filled 2024-12-13: qty 1

## 2024-12-13 MED ORDER — POLYETHYLENE GLYCOL 3350 17 G PO PACK
17.0000 g | PACK | Freq: Two times a day (BID) | ORAL | Status: DC
  Administered 2024-12-13 – 2024-12-15 (×2): 17 g
  Filled 2024-12-13: qty 1

## 2024-12-13 MED ORDER — FERROUS SULFATE 300 (60 FE) MG/5ML PO SOLN
300.0000 mg | Freq: Every day | ORAL | Status: DC
  Administered 2024-12-14 – 2024-12-24 (×11): 300 mg
  Filled 2024-12-13 (×14): qty 5

## 2024-12-13 MED ORDER — VANCOMYCIN VARIABLE DOSE PER UNSTABLE RENAL FUNCTION (PHARMACIST DOSING): Status: DC

## 2024-12-13 MED ORDER — TRAZODONE HCL 50 MG PO TABS: 75.0000 mg | ORAL_TABLET | Freq: Every evening | ORAL | Status: DC | PRN

## 2024-12-13 MED ORDER — METHOCARBAMOL 500 MG PO TABS
500.0000 mg | ORAL_TABLET | Freq: Three times a day (TID) | ORAL | Status: DC | PRN
  Administered 2024-12-13 – 2024-12-24 (×7): 500 mg
  Filled 2024-12-13 (×7): qty 1

## 2024-12-13 MED ORDER — ATORVASTATIN CALCIUM 80 MG PO TABS
80.0000 mg | ORAL_TABLET | Freq: Every day | ORAL | Status: DC
  Administered 2024-12-14 – 2024-12-25 (×12): 80 mg
  Filled 2024-12-13 (×12): qty 1

## 2024-12-13 MED ORDER — VANCOMYCIN HCL 2000 MG/400ML IV SOLN
2000.0000 mg | Freq: Once | INTRAVENOUS | Status: AC
  Administered 2024-12-13: 2000 mg via INTRAVENOUS
  Filled 2024-12-13: qty 400

## 2024-12-13 MED ORDER — MAGNESIUM SULFATE 2 GM/50ML IV SOLN
2.0000 g | Freq: Once | INTRAVENOUS | Status: AC
  Administered 2024-12-13: 2 g via INTRAVENOUS
  Filled 2024-12-13: qty 50

## 2024-12-13 MED ORDER — POLYETHYLENE GLYCOL 3350 17 G PO PACK
17.0000 g | PACK | Freq: Two times a day (BID) | ORAL | Status: DC
  Filled 2024-12-13: qty 1

## 2024-12-13 MED ORDER — THIAMINE MONONITRATE 100 MG PO TABS
100.0000 mg | ORAL_TABLET | Freq: Every day | ORAL | Status: AC
  Administered 2024-12-13 – 2024-12-19 (×7): 100 mg
  Filled 2024-12-13 (×7): qty 1

## 2024-12-13 MED ORDER — PIPERACILLIN-TAZOBACTAM 3.375 G IVPB
3.3750 g | Freq: Three times a day (TID) | INTRAVENOUS | Status: DC
  Administered 2024-12-13 – 2024-12-14 (×4): 3.375 g via INTRAVENOUS
  Filled 2024-12-13 (×4): qty 50

## 2024-12-13 MED ORDER — PROSOURCE TF20 ENFIT COMPATIBL EN LIQD
60.0000 mL | Freq: Two times a day (BID) | ENTERAL | Status: DC
  Administered 2024-12-13 – 2024-12-16 (×6): 60 mL
  Filled 2024-12-13 (×6): qty 60

## 2024-12-13 MED ORDER — VITAMIN C 500 MG PO TABS
1000.0000 mg | ORAL_TABLET | Freq: Every day | ORAL | Status: DC
  Administered 2024-12-14 – 2024-12-24 (×11): 1000 mg
  Filled 2024-12-13 (×11): qty 2

## 2024-12-13 NOTE — Evaluation (Signed)
 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 PHMx: idiopathic pulmonary fibrosis, OSA, HTN, DM2, CKD, Afib, CHF, CABG, R BKA 2022.     Clinical Impressions This 84 yo male admitted with above presents to acute OT with PLOF of being independent to Mod I with basic ADLs and mobility from a scooter level. Currently he is total A-total A +2 for basic ADLs at bed level due to lethargy, weakness, and increased edema in Bil UEs. He will continue to benefit from acute OT with follow up from intensive inpatient follow-up therapy, >3 hours/day.      If plan is discharge home, recommend the following:   Two people to help with walking and/or transfers;Two people to help with bathing/dressing/bathroom;Assistance with cooking/housework;Assistance with feeding;Direct supervision/assist for medications management;Direct supervision/assist for financial management;Assist for transportation;Help with stairs or ramp for entrance     Functional Status Assessment   Patient has had a recent decline in their functional status and demonstrates the ability to make significant improvements in function in a reasonable and predictable amount of time.     Equipment Recommendations   Other (comment) (TBD next venue)      Precautions/Restrictions   Precautions Precautions: Fall Recall of Precautions/Restrictions: Intact Precaution/Restrictions Comments: watch BP Restrictions Weight Bearing Restrictions Per Provider Order: Yes LLE Weight Bearing Per Provider Order: Non weight bearing     Mobility Bed Mobility Overal bed mobility: Needs Assistance Bed Mobility: Supine to Sit      Supine to sit: Mod assist, +2 for physical assistance, +2 for safety/equipment     General bed mobility comments: First chair position and then long sitting in the bed with Mod +2 assist at trunk. Pt with core strength BP did well with no drop in chair position in bed and long sitting without back support. Pt denies dizziness.    Transfers                   General transfer comment: unable at this time.      Balance Overall balance assessment: Needs assistance Sitting-balance support: Bilateral upper extremity supported Sitting balance-Leahy Scale: Poor Sitting balance - Comments: Mod A +2 long sitting in bed                                   ADL either performed or assessed with clinical judgement   ADL                                         General ADL Comments: total A-total A +2 at bed level     Vision Patient Visual Report: No change from baseline              Pertinent Vitals/Pain Pain Assessment Pain Assessment: Faces Faces Pain Scale: Hurts even more Pain Location: LLE Pain Descriptors / Indicators: Discomfort, Grimacing Pain Intervention(s): Limited activity within patient's tolerance, Monitored during session, Repositioned     Extremity/Trunk Assessment Upper Extremity Assessment Upper Extremity Assessment: Right hand dominant;RUE deficits/detail;LUE deficits/detail RUE Deficits /  Details: Both hand edematous (L>R) and heavy when attempts to move them RUE Coordination: decreased fine motor;decreased gross motor LUE Deficits / Details: Both hand edematous (L>R) and heavy when attempts to move them LUE Coordination: decreased gross motor;decreased fine motor   Lower Extremity Assessment Lower Extremity Assessment: Generalized weakness;LLE deficits/detail;RLE deficits/detail RLE Deficits / Details: BKA atleast 3/5 strength LLE Deficits / Details: recnet BKA atleast 3/5 strength   Cervical / Trunk  Assessment Cervical / Trunk Assessment: Normal   Communication Communication Communication: Impaired Factors Affecting Communication: Difficulty expressing self;Reduced clarity of speech (due to lethargy)   Cognition Arousal: Lethargic Behavior During Therapy: Flat affect                                 Following commands: Intact       Cueing  General Comments   Cueing Techniques: Verbal cues;Tactile cues  Spouse present and supportive throughout session. BP soft without a decrease in sitting.   Exercises Other Exercises Other Exercises: Encouraged his wife in room that when pt is more awake/alert to encourage pt to move his hands and arms to help with edema control. Both UEs propped up for edema control prior to leaving room        Home Living Family/patient expects to be discharged to:: Private residence Living Arrangements: Spouse/significant other;Other relatives (grandson) Available Help at Discharge: Family;Available 24 hours/day Type of Home: House Home Access: Ramped entrance     Home Layout: Two level;Able to live on main level with bedroom/bathroom     Bathroom Shower/Tub: Producer, Television/film/video: Handicapped height Bathroom Accessibility: Yes   Home Equipment: Pharmacist, Hospital (2 wheels);Electric scooter;Wheelchair - manual;Cane - single point;Other (comment);Grab bars - tub/shower;Grab bars - toilet;Adaptive equipment Adaptive Equipment: Reacher;Sock aid;Long-handled shoe horn;Long-handled sponge        Prior Functioning/Environment Prior Level of Function : Independent/Modified Independent             Mobility Comments: Uses a scooter in house and when out and about ADLs Comments: pt able to transfer to shower seat    OT Problem List: Decreased range of motion;Decreased activity tolerance;Impaired balance (sitting and/or standing);Pain   OT Treatment/Interventions: Self-care/ADL training;Energy  conservation;Patient/family education;Balance training      OT Goals(Current goals can be found in the care plan section)   Acute Rehab OT Goals Patient Stated Goal: wants to go to rehab OT Goal Formulation: With patient/family Time For Goal Achievement: 12/27/24 Potential to Achieve Goals: Good   OT Frequency:  Min 2X/week    Co-evaluation PT/OT/SLP Co-Evaluation/Treatment: Yes Reason for Co-Treatment: For patient/therapist safety PT goals addressed during session: Mobility/safety with mobility;Strengthening/ROM OT goals addressed during session: Strengthening/ROM      AM-PAC OT 6 Clicks Daily Activity     Outcome Measure Help from another person eating meals?: Total Help from another person taking care of personal grooming?: Total Help from another person toileting, which includes using toliet, bedpan, or urinal?: Total Help from another person bathing (including washing, rinsing, drying)?: Total Help from another person to put on and taking off regular upper body clothing?: Total Help from another person to put on and taking off regular lower body clothing?: Total 6 Click Score: 6   End of Session Nurse Communication: Mobility status (pt in chair position, pt with bowel movement and needs cleaning up)  Activity Tolerance: Patient limited by lethargy;Patient limited by fatigue Patient left: in bed;with call  bell/phone within reach;with family/visitor present;with bed alarm set  OT Visit Diagnosis: Other abnormalities of gait and mobility (R26.89);Muscle weakness (generalized) (M62.81);Pain Pain - Right/Left: Left Pain - part of body: Leg                Time: 8563-8546 OT Time Calculation (min): 17 min Charges:  OT General Charges $OT Visit: 1 Visit OT Evaluation $OT Eval Moderate Complexity: 1 Mod  Cathy L. OT Acute Rehabilitation Services Office [phone]    Rodgers Dorothyann Distel 12/13/2024, 3:27 PM

## 2024-12-13 NOTE — Progress Notes (Signed)
 Patient chocked on Juven drink, despite good positioning and patient's request for liquids. Brook NP notified, placed NPO.

## 2024-12-13 NOTE — Evaluation (Signed)
 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 PHMx: idiopathic pulmonary fibrosis, OSA, HTN, DM2, CKD, Afib, CHF, CABG, R BKA 2022.  Clinical Impression  Pt currently presenting at Mod +2 to get to long sitting in the bed from full chair position. Pt BP did not drop in full upright sitting. Spouse was present and supportive. BP soft throughout. Pt had BM at end of session and RN was made aware. Due to pt current functional status, home set up and available assistance at home recommending skilled physical therapy services > 3 hours/day in order to address strength, balance and functional mobility to decrease risk for falls, injury, immobility, skin break down and re-hospitalization.          If plan is discharge home, recommend the following: A lot of help with walking and/or transfers;Help with stairs or ramp for entrance;Assist for transportation;Assistance with cooking/housework     Equipment Recommendations None recommended by PT  Recommendations for Other Services  Rehab consult    Functional Status Assessment Patient has had a recent decline in their functional status and demonstrates the ability to make significant improvements in function in a reasonable and predictable amount of time.     Precautions / Restrictions Precautions Precautions: Fall;Other (comment) Recall of Precautions/Restrictions: Intact Precaution/Restrictions Comments: watch BP Restrictions Weight Bearing Restrictions Per Provider Order: Yes LLE Weight Bearing Per Provider Order: Non weight bearing      Mobility  Bed Mobility Overal bed mobility: Needs Assistance Bed Mobility: Supine to Sit      Supine to sit: Mod assist, +2 for physical assistance, +2 for safety/equipment     General bed mobility comments: First chair position and then long sitting in the bed with Mod +2 assist at trunk. Pt with core strength BP did well with no drop in chair position in bed and long sitting without back support. Pt denies dizziness.    Transfers   General transfer comment: unable at this time.       Balance Overall balance assessment: Needs assistance Sitting-balance support: Bilateral upper extremity supported Sitting balance-Leahy Scale: Poor Sitting balance - Comments: Mod A +2 long sitting in bed         Pertinent Vitals/Pain Pain Assessment Pain Assessment: Faces Faces Pain Scale: Hurts even more Pain Location: LLE Pain Descriptors / Indicators: Discomfort, Grimacing Pain Intervention(s): Limited activity within patient's tolerance, Monitored during session, Repositioned    Home Living Family/patient expects to be discharged to:: Private residence Living Arrangements: Spouse/significant other;Other relatives (grand son) Available Help at Discharge: Family;Available 24 hours/day Type of Home: House Home Access: Ramped entrance       Home Layout: Two level;Able to live on main level with bedroom/bathroom Home Equipment: Shower seat;Rolling Walker (2 wheels);Electric scooter;Wheelchair - manual;Cane - single point;Other (comment);Grab bars - tub/shower;Grab bars - toilet;Adaptive equipment      Prior Function Prior Level of Function : Independent/Modified Independent         ADLs Comments: pt able to transfer to shower seat     Extremity/Trunk Assessment   Upper Extremity Assessment Upper Extremity Assessment: Defer to OT evaluation    Lower Extremity Assessment Lower Extremity Assessment: Generalized weakness;LLE deficits/detail;RLE deficits/detail RLE Deficits / Details:  BKA atleast 3/5 strength LLE Deficits / Details: recnet BKA atleast 3/5 strength     Cervical / Trunk Assessment Cervical / Trunk Assessment: Normal  Communication   Communication Communication: Impaired Factors Affecting Communication: Difficulty expressing self;Reduced clarity of speech (due to fatigue)    Cognition Arousal: Lethargic Behavior During Therapy: Flat affect   PT - Cognitive impairments: No apparent impairments       Following commands: Intact       Cueing Cueing Techniques: Verbal cues, Tactile cues     General Comments General comments (skin integrity, edema, etc.): Spouse present and supportive throughout session. BP soft without a decrease in sitting.        Assessment/Plan    PT Assessment Patient needs continued PT services  PT Problem List Decreased strength;Decreased activity tolerance;Decreased mobility;Decreased balance       PT Treatment Interventions DME instruction;Therapeutic exercise;Balance training;Functional mobility training;Therapeutic activities;Patient/family education;Wheelchair mobility training    PT Goals (Current goals can be found in the Care Plan section)  Acute Rehab PT Goals Patient Stated Goal: improve mobility PT Goal Formulation: With patient/family Time For Goal Achievement: 12/27/24 Potential to Achieve Goals: Good    Frequency Min 3X/week     Co-Evaluation to address safety with pt mobility and due to pt fatigue.     AM-PAC PT 6 Clicks Mobility  Outcome Measure Help needed turning from your back to your side while in a flat bed without using bedrails?: A Lot Help needed moving from lying on your back to sitting on the side of a flat bed without using bedrails?: Total Help needed moving to and from a bed to a chair (including a wheelchair)?: Total Help needed standing up from a chair using your arms (e.g., wheelchair or bedside chair)?: Total Help needed to walk in hospital room?: Total Help needed climbing 3-5 steps with a railing? : Total 6 Click Score: 7    End of Session Equipment  Utilized During Treatment: Oxygen Activity Tolerance: Patient limited by lethargy Patient left: in bed;with call bell/phone within reach;with bed alarm set;with family/visitor present Nurse Communication: Mobility status;Need for lift equipment PT Visit Diagnosis: Other abnormalities of gait and mobility (R26.89)    Time: 8564-8546 PT Time Calculation (min) (ACUTE ONLY): 18 min   Charges:   PT Evaluation $PT Eval Low Complexity: 1 Low   PT General Charges $$ ACUTE PT VISIT: 1 Visit         Dorothyann Maier, DPT, CLT  Acute Rehabilitation Services Office: [phone] (Secure chat preferred)   Dorothyann VEAR Maier 12/13/2024, 3:08 PM

## 2024-12-13 NOTE — Plan of Care (Signed)
" °  Problem: Education: Goal: Knowledge of the prescribed therapeutic regimen will improve Outcome: Progressing   Problem: Bowel/Gastric: Goal: Gastrointestinal status for postoperative course will improve Outcome: Progressing   Problem: Cardiac: Goal: Ability to maintain an adequate cardiac output Outcome: Progressing Goal: Will show no evidence of cardiac arrhythmias Outcome: Progressing   "

## 2024-12-13 NOTE — Procedures (Signed)
 Cortrak  Person Inserting Tube:  Shakeela Rabadan T, RD Tube Type:  Cortrak - 55 inches Tube Size:  10 Tube Location:  Left nare Secured by: Bridle Initial Placement:  Gastric Technique Used to Measure Tube Placement:  Marking at nare/corner of mouth Cortrak Secured At:  93 cm Initial Placement Verification:  Xray   Cortrak Tube Team Note:  Consult received to place a Cortrak feeding tube.   X-ray has been ordered by the Cortrak team. Please confirm placement prior to using tube.   If the tube becomes dislodged please keep the tube and contact the Cortrak team at [email or web address] for replacement.  If after hours and replacement cannot be delayed, place a NG tube and confirm placement with an abdominal x-ray.    Trude Ned RD, LDN Contact via [employer].

## 2024-12-13 NOTE — Progress Notes (Addendum)
 Pharmacy Antibiotic Note  Shane [name] is a 84 y.o. 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  The following day, patient became hypotensive and transferred to the ICU.  He remains on pressor.  Pharmacy has been consulted for vancomycin  for septic shock.  He is also started on Zosyn .  Renal function worsening - SCr up to 2.62 (BL SCr 1.4), afebrile, WBC up to 15.4 post-op.  Plan: Vanc 2gm IV x1 Zosyn  EID 3.375gm IV Q8H Monitor renal fxn, clinical progress, VR in AM and redose if < 20 mcg/mL  Height: 6' (182.9 cm) Weight: 103.4 kg (228 lb) IBW/kg (Calculated) : 77.6  Temp (24hrs), Avg:99 F (37.2 C), Min:98.1 F (36.7 C), Max:100.2 F (37.9 C)  Recent Labs  Lab 12/11/24 0746 12/12/24 0655 12/12/24 1029 12/12/24 2052 12/13/24 0205  WBC 8.9 11.8* 13.0*  --  15.4*  CREATININE 1.41* 2.57*  --   --  2.62*  LATICACIDVEN  --   --  2.6* 1.6  --     Estimated Creatinine Clearance: 26.6 mL/min (A) (by C-G formula based on SCr of 2.62 mg/dL (H)).    Allergies[1]  Vanc 1/9 >> Zosyn  1/9 >>   1/8 MRSA PCR - negative 1/9 BCx -   Kesean Serviss D. [place], PharmD, BCPS, BCCCP 12/13/2024, 11:14 AM     [1]  Allergies Allergen Reactions   Codeine Hives and Itching   Ofev  [Nintedanib ] Diarrhea    SEVERE DIARRHEA   Pirfenidone  Diarrhea and Other (See Comments)    Esbriet  (Pirfenidone ) causes elevated LFTs. D/C on 06/14/17 and SEVERE DIARRHEA    Amoxicillin Itching

## 2024-12-13 NOTE — Progress Notes (Signed)
 Initial Nutrition Assessment  DOCUMENTATION CODES:   Non-severe (moderate) malnutrition in context of chronic illness, Obesity unspecified  INTERVENTION:   Initiate tube feeding via Cortrak tube: Vital 1.5 at 25 ml/h and increase by 10 ml every 8 hours to goal rate of 45 ml/hr (1080 ml per day)  Prosource TF20 60 ml BID  Provides 1780 kcal, 112 gm protein, 820 ml free water daily  Pt is at risk for refeeding syndrome given pt meets criteria for moderate malnutrition and limited intake PTA. Monitor magnesium  and phosphorus daily x 4 occurrences or until WNL, MD to replete as needed.   100 mg thiamine  daily x 7 days   NUTRITION DIAGNOSIS:   Moderate Malnutrition related to chronic illness (non-healing wound) as evidenced by mild fat depletion, mild muscle depletion.  GOAL:   Patient will meet greater than or equal to 90% of their needs  MONITOR:   TF tolerance, Labs  REASON FOR ASSESSMENT:   Consult Enteral/tube feeding initiation and management  ASSESSMENT:   Pt with PMH of CKD stage III, DM, HTN, HLD, Idiopathic pulmonary fibrosis, ILD, OSA on CPAP, CHF with LVEF 30-35% who failed outpatient treatment for L foot wound and admitted for amputation.  Pt choked while drinking Juven and transferred to the ICU. Spoke with [name], NP. Pt on pressors and starting antibiotics. Ok to start TF.   Pt wakes to voice but goes back to sleep easily. Nutrition hx provided by wife. Pt eats 3 meals per day but is very picky about foods and even brands of foods.  Breakfast: omelette or cereal Lunch: pimento cheese or chicken salad (from a particular store) sandwich Dinner: meat, veggie but sometimes does not eat all of his food. He does not always eat his meats.  Grandson lives above garage and sometimes they will eat mexican or pizza Pt usually has low blood sugar at 2-3 am and has OJ, pimento cheese and crackers    1/7 - admitted for L transtibial amputation  1/8 - became  hypotensive, tx ICU on pressors 1/9 - NPO; s/p cortrak placement tip in gastric antrum per xray   Meal Completion: 75-100% when on PO diet  Medications reviewed and include: 1000 mg Vitamin C , colace, SSI every 4 hours, 10 units lantus  daily, MVI with minerals, Juven BID, miralax  2 g mag sulfate x 1  Levophed  @ 2 mcg   Labs reviewed:  Na 124 BUN 67 Cr 2.62 Mag 1.5 A1C 7.6 CBG's: 169-224   [name]: 1405 ml  VAC 80 ml   NUTRITION - FOCUSED PHYSICAL EXAM:  Flowsheet Row Most Recent Value  Orbital Region No depletion  Upper Arm Region Mild depletion  [watery]  Thoracic and Lumbar Region No depletion  Buccal Region Mild depletion  Temple Region No depletion  Clavicle Bone Region No depletion  Clavicle and Acromion Bone Region No depletion  Scapular Bone Region Unable to assess  Dorsal Hand Unable to assess  [edema]  Patellar Region Mild depletion  [R thigh only (unable to assess L thigh)]  Anterior Thigh Region Mild depletion  Posterior Calf Region Unable to assess  [no calf present]  Edema (RD Assessment) Moderate  [hands, L thigh]  Hair Reviewed  Eyes Reviewed  Mouth Unable to assess  Skin Reviewed  Nails Reviewed  [white]    Diet Order:   Diet Order             Diet NPO time specified  Diet effective now  EDUCATION NEEDS:   Education needs have been addressed  Skin:  Skin Assessment: Reviewed RN Assessment (L leg incision)  Last BM:  1/9 type 4; large  Height:   Ht Readings from Last 1 Encounters:  12/11/24 6' (1.829 m)    Weight:   Wt Readings from Last 1 Encounters:  12/11/24 103.4 kg    BMI:  Adjusted 35.5 Adjusted IBW: 70.3 kg    Estimated Nutritional Needs:   Kcal:  1700-2000  Protein:  95-115 grams  Fluid:  > 1.7 L/day  Cayce Quezada P., RD, LDN, CNSC See AMiON for contact information

## 2024-12-13 NOTE — Evaluation (Signed)
 Clinical/Bedside Swallow Evaluation Patient Details  Name: Shane [name] MRN: [id number] Date of Birth: 01-18-1941  Today's Date: 12/13/2024 Time: SLP Start Time (ACUTE ONLY): 1401 SLP Stop Time (ACUTE ONLY): 1427 SLP Time Calculation (min) (ACUTE ONLY): 26 min  Past Medical History:  Past Medical History:  Diagnosis Date   Acute blood loss anemia    Anxiety    AV block, Mobitz 1    Cataract    Chronic kidney disease    d/t DM   CKD (chronic kidney disease), stage III (HCC)    COVID-19 virus infection 07/18/2020   Last Assessment & Plan:   Formatting of this note might be different from the original.  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 10/2019 Echo: EF 55-60%, Gr2 DD. Nl RV.    Neuromuscular disorder (HCC)    Neuropathy associated with endocrine disorder    Nonobstructive CAD (coronary artery disease)    a. 2012 Cath: mod, nonobs dzs; b. 10/2016 MV: EF 60%, no ischemia.   OSA on CPAP 05/05/2017   Unattended Home Sleep Test 7/2/813-AHI 38.6/hour, desaturation to 64%, body weight 261 pounds   PONV (postoperative nausea and vomiting)    Postoperative anemia due to acute blood loss 11/07/2020   Postoperative hemorrhagic shock 03/20/2021   Sleep apnea     uses cpap asked to bring mask and tubing   Past Surgical History:  Past Surgical History:  Procedure Laterality Date   ABDOMINAL AORTOGRAM W/LOWER EXTREMITY N/A 12/10/2020   Procedure: ABDOMINAL AORTOGRAM W/LOWER EXTREMITY;  Surgeon: Magda Debby SAILOR, MD;  Location: [facility]  INVASIVE CV LAB;  Service: Cardiovascular;  Laterality: N/A;   AMPUTATION Right 01/22/2021   Procedure: 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;  Surgeon: Harden Jerona GAILS, MD;  Location: [facility] OR;  Service: Orthopedics;  Laterality: Right;   AMPUTATION Right 03/17/2021   Procedure: RIGHT BELOW KNEE AMPUTATION;  Surgeon: Harden Jerona GAILS, MD;  Location: [facility] OR;  Service: Orthopedics;  Laterality: Right;   AMPUTATION Left 12/11/2024   Procedure: AMPUTATION BELOW KNEE;  Surgeon: Harden Jerona GAILS, MD;  Location: [facility] OR;  Service: Orthopedics;  Laterality: Left;   ANKLE FUSION Right 01/22/2021   Procedure: RIGHT FOOT TIBIOCALCANEAL FUSION;  Surgeon: Harden Jerona GAILS, MD;  Location: [facility] OR;  Service: Orthopedics;  Laterality: Right;   ANTERIOR FUSION CERVICAL SPINE  2012   APPLICATION OF WOUND VAC Left 12/11/2024   Procedure: APPLICATION, WOUND VAC;  Surgeon: Harden Jerona GAILS, MD;  Location: [facility] OR;  Service: Orthopedics;  Laterality: Left;   CARDIAC CATHETERIZATION  2011   CARDIAC CATHETERIZATION N/A 11/09/2016   Procedure: Right Heart Cath;  Surgeon: Victory LELON Sharps, MD;  Location: [facility] INVASIVE CV LAB;  Service: Cardiovascular;  Laterality: N/A;   carpel tunnel     left wrist   CATARACT EXTRACTION     CATARACT EXTRACTION W/ INTRAOCULAR LENS  IMPLANT, BILATERAL  2013   CERVICAL LAMINECTOMY  2012   COLONOSCOPY N/A 01/14/2013  Procedure: COLONOSCOPY;  Surgeon: Norleen LOISE Kiang, MD;  Location: [facility] ENDOSCOPY;  Service: Endoscopy;  Laterality: N/A;   CORONARY ARTERY BYPASS GRAFT  11/04/2020   LIMA-LAD, SVG-OM1, SVG-PDA (Dr Norleen Exon [facility]) dc 11/18/2020   EYE SURGERY     I & D EXTREMITY Right 02/19/2021   Procedure: RIGHT ANKLE DEBRIDEMENT AND PLACEMENT ANTIBIOTIC BEADS;  Surgeon: Harden Jerona GAILS, MD;  Location: [facility] OR;  Service: Orthopedics;  Laterality: Right;   KNEE SURGERY  1998   left   LEFT HEART CATH AND CORONARY ANGIOGRAPHY N/A 07/10/2020   Procedure: LEFT HEART CATH AND CORONARY ANGIOGRAPHY;  Surgeon: Wonda Sharper, MD;  Location: [facility]  INVASIVE CV LAB;  Service: Cardiovascular;  Laterality: N/A;   LUMBAR LAMINECTOMY  2003   LUNG BIOPSY Left 12/26/2016   Procedure: LUNG BIOPSY;  Surgeon: Elspeth JAYSON Millers, MD;  Location: [facility] OR;  Service: Thoracic;  Laterality: Left;   PACEMAKER IMPLANT N/A 03/30/2020   Procedure: PACEMAKER IMPLANT;  Surgeon: Waddell Danelle ORN, MD;  Location: [facility] INVASIVE CV LAB;  Service: Cardiovascular;  Laterality: N/A;   PERIPHERAL VASCULAR INTERVENTION Right 12/10/2020   Procedure: PERIPHERAL VASCULAR INTERVENTION;  Surgeon: Magda Debby LOISE, MD;  Location: [facility] INVASIVE CV LAB;  Service: Cardiovascular;  Laterality: Right;  SFA   POSTERIOR FUSION CERVICAL SPINE  2012   PPM GENERATOR REMOVAL N/A 12/14/2020   Procedure: PPM GENERATOR REMOVAL;  Surgeon: Waddell Danelle ORN, MD;  Location: [facility] INVASIVE CV LAB;  Service: Cardiovascular;  Laterality: N/A;   TEE WITHOUT CARDIOVERSION N/A 12/11/2020   Procedure: TRANSESOPHAGEAL ECHOCARDIOGRAM (TEE);  Surgeon: Barbaraann Darryle Debby, MD;  Location: [facility] ENDOSCOPY;  Service: Cardiovascular;  Laterality: N/A;   TRIGGER FINGER RELEASE  2011   4th finger left hand   VIDEO ASSISTED THORACOSCOPY Left 12/26/2016   Procedure: VIDEO ASSISTED THORACOSCOPY;  Surgeon: Elspeth JAYSON Millers, MD;  Location: [facility] OR;  Service: Thoracic;  Laterality: Left;   VIDEO BRONCHOSCOPY N/A 12/26/2016   Procedure: VIDEO BRONCHOSCOPY;  Surgeon: Elspeth JAYSON Millers, MD;  Location: [facility] OR;  Service: Thoracic;  Laterality: N/A;   HPI:  84 yo male admitted 1/7 for L transtibial amputation. Transferred to ICU 1/8 for hypotension. Seen by SLP 12/2020 and 12/2023 with concern for primary esophageal dysphagia, complaining of difficulty with solids>liquids and episodes of regurgitation. Declined an MBS and although recommended, esophageal assessment was not been completed. PMH: CKD III, DM, HTN, HLD, IPF, OSA on CPAP, HFrEF with LVEF 30-35%    Assessment / Plan / Recommendation  Clinical Impression  Recommend NPO status  be maintained with the exception of ice chips in moderation with frequent oral care. Expect good prognosis to resume PO diet once level of alertness improves. Could consider instrumental testing for further assessment vs dedicated esophageal assessment given history of suspected esophageal dysphagia if pt is agreeable once level of alertness is more consistent.   Pt seen immediately following Cortrak placement after RN observed coughing with liquids and decreased alertness throughout the day today. He exhibits coughing with larger sips of water but assessment is overall limited by resistance to sitting upright and lethargy. He is only able to sustain alertness for the time needed to transit boluses orally and initiate a swallow given Max multimodal cueing. Coughing does not occur with controlled cup sips of water or bites of applesauce.    SLP Visit Diagnosis: Dysphagia, unspecified (R13.10)    Aspiration Risk  Mild aspiration risk    Diet Recommendation  Other Recommendations Oral Care Recommendations: Oral care QID;Oral care prior to ice chip/H20     Swallow Evaluation Recommendations Recommendations: NPO;Ice chips PRN after oral care Medication Administration: Via alternative means Oral care recommendations: Oral care QID (4x/day);Oral care before ice chips/water   Assistance Recommended at Discharge    Functional Status Assessment Patient has had a recent decline in their functional status and demonstrates the ability to make significant improvements in function in a reasonable and predictable amount of time.  Frequency and Duration min 2x/week  2 weeks       Prognosis Prognosis for improved oropharyngeal function: Good      Swallow Study   General HPI: 84 yo male admitted 1/7 for L transtibial amputation. Transferred to ICU 1/8 for hypotension. Seen by SLP 12/2020 and 12/2023 with concern for primary esophageal dysphagia, complaining of difficulty with solids>liquids and  episodes of regurgitation. Declined an MBS and although recommended, esophageal assessment was not been completed. PMH: CKD III, DM, HTN, HLD, IPF, OSA on CPAP, HFrEF with LVEF 30-35% Type of Study: Bedside Swallow Evaluation Previous Swallow Assessment: see HPI Diet Prior to this Study: NPO;Cortrak/Small bore NG tube Temperature Spikes Noted: No Respiratory Status: Room air History of Recent Intubation: No Behavior/Cognition: Lethargic/Drowsy;Requires cueing Oral Cavity Assessment: Within Functional Limits Oral Care Completed by SLP: No Oral Cavity - Dentition: Adequate natural dentition Vision: Functional for self-feeding Self-Feeding Abilities: Total assist Patient Positioning: Upright in bed Baseline Vocal Quality: Normal Volitional Cough: Strong Volitional Swallow: Able to elicit    Oral/Motor/Sensory Function Overall Oral Motor/Sensory Function: Within functional limits   Ice Chips Ice chips: Within functional limits Presentation: Spoon   Thin Liquid Thin Liquid: Impaired Presentation: Cup;Straw Pharyngeal  Phase Impairments: Throat Clearing - Immediate;Cough - Immediate    Nectar Thick Nectar Thick Liquid: Not tested   Honey Thick Honey Thick Liquid: Not tested   Puree Puree: Within functional limits Presentation: Spoon   Solid     Solid: Not tested      Damien Blumenthal, M.A., CCC-SLP Speech Language Pathology, Acute Rehabilitation Services  Secure Chat preferred [phone]  12/13/2024,2:54 PM

## 2024-12-13 NOTE — Progress Notes (Addendum)
 "  NAME:  Shane [name], MRN:  [id number], DOB:  February 22, 1941, LOS: 2 ADMISSION DATE:  12/11/2024, CONSULTATION DATE:  12/12/24 REFERRING MD:  Dr. Harden, CHIEF COMPLAINT:  hypotension   History of Present Illness:  84 year old male with past medical history as below, which is significant for chronic kidney disease stage III, diabetes, hypertension, hyperlipidemia, IPF, OSA on CPAP and at bedtime 2L [place], and HFrEF with LVEF 30 to 35%.  He presented to [facility] 1/7 after failing conservative outpatient therapy for ischemic lower extremity ulcers and left foot with gangrene.  He underwent transtibial amputation and was subsequently sent to the medical floor for recovery.  In the early a.m. hours 1/8 he became hypotensive which did improve temporarily with crystalloid bolus, however, he again became hypotensive with systolic blood pressures in the 70s and 80s.  PCCM was asked to evaluate on this basis.   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  Significant Hospital Events: Including procedures, antibiotic start and stop dates in addition to other pertinent events   1/7 admit for L transtibial amputation 1/7 1/8 hypotensive, tx to ICU on pressors, albumin , lactic  resolved  Interim History / Subjective:  Peripheral NE down from 10> 3 mcg this am Complains of LLE pain, mild abd tenderness, and some SOB. Remains somnolent in between c/o of pain.   Objective    Blood pressure (!) 108/49, pulse 70, temperature 99.6 F (37.6 C), temperature source Oral, resp. rate (!) 21, height 6' (1.829 m), weight 103.4 kg, SpO2 99%.        Intake/Output Summary (Last 24 hours) at 12/13/2024 0931 Last data filed at 12/13/2024 9094 Gross per 24 hour  Intake 3644.73 ml  Output 1405 ml  Net 2239.73 ml   Filed Weights   12/11/24 0635  Weight: 103.4 kg    Examination: General:  ill appearing elderly male in bed HEENT: MM pink/dry, pupils 3/r, no obvious JVD but limited 2/2 body habitus Neuro:  awakens to verbal, oriented to person, place and month, falls asleep during conversation, MAE generalized weakness, f/c CV: paced, no murmur PULM:  mild WOB/ tachypnea, fine crackles except RUL, 98% on 2L GI: obese, soft, +bs, mild diffuse tenderness, voids Extremities: warm/dry, R BKA, L BKA with wound vac dressing, no obvious erythema, some edema, dk blood drainage Skin: no rashes   [name] 1.4L/ 24hrs Wound vac drainage>  +2.2L Labs> Na 131> 128, K 4.7, Cl 90, glucose 243, BUN/ sCr 60/2.57> 67/ 2.62, WBC 13> 15.4, H/H stable, plts 180  Resolved problem list    Assessment and Plan   Shock, suspected sepsis, unclear etiology, ?LLE Lactic acidosis, resolved P:  - previously considered hypotension related to polypharmacy.  H/H remains stable.  Increasing leukocytosis, fever curve, RR and lethargy, warm extremities> more septic picture - cont peripheral NE for MAP goal > 65, improved requirements from overnight after albumin  x 4 doses and 2L LR yest, if ongoing requirements, will need  CVL  - lactic cleared last evening - echo 1/8 30-35%, G3DD, mild MR, s/p AVR, est RA pressure 15 - check BC and UA.  Start empiric abx - vanc/ zosyn  and follow WBC/ fever curve  - hold on  further fluids with resp status, and elevated RA pressure  - stable H/H    HFrEF LVEF 30-35% on most recent echo.  HTN - echo stable  - ideally consider diureses but defer given pressor requirements and AKI - cont to hold lasix  and lisinopril    Atrial fib - mentioned in [facility] cardiology notes but nothing in our EMR.  Sick sinus syndrome HLD - Resume Eliquis  when OK with ortho  - telemetry monitoring - optimize electrolytes, K > 4, Mag > 2 - statin   DMT2 - CBGs remain elevated.  Not eating much - increase prn SSI to resistant, cont lantus  10u daily - CBG q 4  AKI on CKD in setting of hypotension, ACE, diuretics  Hyponatremia  - decent [name] at 1.4L/ 24hrs, overall stable sCr - monitor Na on BMETs - consider renal US  if worsening indices  - monitor for bladder retention - trend renal indices  - strict I/Os, daily wts - avoid nephrotoxins, renal dose meds, hemodynamic support as above  OSA on CPAP and 2L O2 at night  IPF - stable at last OV with MR - at bedtime CPAP - crackles today> hold further fluids.  Checking VBG.  WOB ok, if worsening, consider BiPAP>> VBG ok 7.34/ 49 - aspiration precautions  - Supplemental O2 as indicated to keep sats> 90%  Acute encephalopathy, suspected metabolic/ septic Chronic benzodiazapine use  - non focal on exam.  Protecting airway  - keep NPO for now till mental status improved.  Place cortrak empirically for likely EN/ meds  - check VBG to rule out hypercarbia - minimize sedation as able - cont hemodynamic support/ neuroprotective measures - delirium precautions - hold home prn ativan .  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 Harden  - per ortho - monitor wound vac output - multi modal pain management> minimize narcotics> prn tylenol , robaxin , oxy, prn fentanyl  for severe pain with scheduled  bowel regimen - PT as appropriate    Wife updated at bedside 1/9 am   Labs   CBC: Recent Labs  Lab 12/11/24 0746 12/12/24 0655 12/12/24 1029 12/13/24 0205  WBC 8.9 11.8* 13.0* 15.4*  NEUTROABS 6.1  --   --   --   HGB 12.0* 9.3* 9.0* 9.7*  HCT 36.2* 27.6* 27.4* 28.7*  MCV 90.7 89.3 91.9 90.0  PLT 194 166 151 180    Basic Metabolic Panel: Recent Labs  Lab 12/11/24 0746 12/12/24 0655 12/13/24 0205  NA 136 131* 128*  K 3.6 4.7 4.7  CL 97* 96* 90*  CO2 26 27 26   GLUCOSE 137* 138* 243*  BUN 44* 60* 67*  CREATININE 1.41* 2.57* 2.62*  CALCIUM  9.0 8.3* 8.4*   GFR: Estimated Creatinine Clearance: 26.6 mL/min (A) (by C-G formula based on SCr of 2.62 mg/dL (H)). Recent Labs  Lab 12/11/24 0746 12/12/24 0655 12/12/24 1029 12/12/24 2052 12/13/24 0205  WBC 8.9 11.8* 13.0*  --  15.4*  LATICACIDVEN  --   --  2.6* 1.6  --     Liver Function Tests: Recent Labs  Lab 12/11/24 0746  AST 20  ALT 13  ALKPHOS 124  BILITOT 0.5  PROT 7.1  ALBUMIN  3.6   No results for input(s): LIPASE, AMYLASE in the last 168 hours. No results for input(s): AMMONIA in the last 168 hours.  ABG    Component Value Date/Time   PHART 7.362 12/27/2016 0334   PCO2ART 45.4 12/27/2016 0334   PO2ART 115 (H) 12/27/2016 0334   HCO3 23.2 07/24/2024 0019   TCO2 24 07/24/2024 0019   ACIDBASEDEF 2.0 07/24/2024 0019   O2SAT 100 07/24/2024 0019     Coagulation Profile: No results for input(s): INR, PROTIME in the last 168 hours.  Cardiac Enzymes: No results for input(s): CKTOTAL, CKMB, CKMBINDEX, TROPONINI in the last 168 hours.  HbA1C: Hgb A1c MFr Bld  Date/Time Value Ref Range Status  12/11/2024 02:39 PM 7.6 (H) 4.8 - 5.6 % Final    Comment:    (NOTE) Diagnosis of Diabetes The following HbA1c ranges recommended by the American Diabetes Association (ADA) may be used as an aid in the diagnosis of diabetes mellitus.  Hemoglobin             Suggested A1C NGSP%               Diagnosis  <5.7                   Non Diabetic  5.7-6.4                Pre-Diabetic  >6.4                   Diabetic  <7.0                   Glycemic control for                       adults with diabetes.    04/09/2024 11:38 PM 8.7 (H) 4.8 - 5.6 % Final    Comment:    (NOTE) Pre diabetes:          5.7%-6.4%  Diabetes:              >6.4%  Glycemic control for   <7.0% adults with diabetes     CBG: Recent Labs  Lab 12/12/24 1148 12/12/24 1549 12/12/24 2117 12/13/24 0657 12/13/24 0745  GLUCAP 155* 169* 224* 213* 222*   Allergies Allergies[1]   Home Medications  Prior to Admission medications  Medication Sig Start Date End Date Taking? Authorizing Provider  acetaminophen  (TYLENOL ) 500 MG tablet Take 1,000 mg by mouth every 8 (eight) hours as needed for mild pain (pain score 1-3) or moderate pain (pain score 4-6).   Yes [provider]  albuterol  (VENTOLIN  HFA) 108 (90 Base) MCG/ACT inhaler Inhale 2 puffs into the lungs every 4 (four) hours as needed for wheezing or shortness of breath. 04/12/21  Yes Angiulli, Toribio PARAS, PA-C  apixaban  (ELIQUIS ) 5 MG TABS tablet Take 5 mg by mouth 2 (two) times daily.   Yes [provider]  Ascorbic Acid  (VITAMIN C ) 1000 MG tablet Take 1,000 mg by mouth daily.   Yes [provider]  atorvastatin  (LIPITOR ) 80 MG tablet TAKE 1 TABLET BY MOUTH EVERY DAY 07/25/22  Yes Pietro Redell RAMAN, MD  diphenhydrAMINE  (BENADRYL ) 50 MG tablet Take 50 mg by mouth at bedtime as needed for itching.   Yes [provider]  docusate sodium  (COLACE) 100 MG capsule Take 1 capsule (100 mg  total) by mouth 2 (two) times daily. Patient taking differently: Take 100 mg by mouth at bedtime. 12/29/23  Yes Tobie Yetta HERO, MD  escitalopram  (LEXAPRO ) 10 MG tablet Take 1 tablet (10 mg total) by mouth daily. 04/12/21  Yes Angiulli, Toribio PARAS, PA-C  furosemide  (LASIX ) 20 MG tablet Take 20 mg by mouth daily with lunch.   Yes [provider]  furosemide  (LASIX ) 40 MG tablet Take 1 tablet (40 mg total) by mouth 2 (two) times daily. 04/14/24 04/14/25 Yes Fairy Frames, MD  gabapentin  (NEURONTIN ) 100 MG capsule Take 1 capsule (100 mg total) by mouth 3 (three) times daily. Patient taking differently: Take 100 mg by mouth 3 (three) times daily as needed (Pain). 12/29/23  Yes Tobie Yetta HERO, MD  HUMALOG  KWIKPEN 100 UNIT/ML KwikPen Inject 10-15 Units into the skin 3 (three) times daily. Can take 15-18 Units with meals while on Prednisone  for 3days Patient taking differently: Inject 15-20 Units into the skin 3 (three) times daily with meals. Sliding scale 04/14/24  Yes Fairy Frames, MD  HYDROcodone  bit-homatropine [facility]) 5-1.5 MG/5ML syrup Take 5 mLs by mouth every 6 (six) hours as needed for cough. 12/29/23  Yes Tobie Yetta HERO, MD  HYDROcodone -acetaminophen  (NORCO/VICODIN) 5-325 MG tablet Take 1 tablet by mouth at bedtime as needed for moderate pain (pain score 4-6) or severe pain (pain score 7-10).   Yes [provider]  hydrOXYzine  (ATARAX ) 25 MG tablet Take 25 mg by mouth at bedtime. 03/06/24  Yes [provider]  ibuprofen (ADVIL) 200 MG tablet Take 400 mg by mouth every 6 (six) hours as needed for moderate pain (pain score 4-6) or mild pain (pain score 1-3).   Yes [provider]  lisinopril  (ZESTRIL ) 10 MG tablet Take 10 mg by mouth daily. 05/08/24  Yes [provider]  LORazepam  (ATIVAN ) 1 MG tablet Take 1 tablet (1 mg total) by mouth at bedtime as needed for sleep. 12/29/23  Yes Tobie Yetta HERO, MD  Magnesium  200 MG TABS Take 200 mg by mouth at bedtime. 03/06/24  Yes [provider]  methocarbamol  (ROBAXIN ) 500 MG tablet Take 1 tablet (500 mg total) by mouth every 8 (eight) hours as needed for muscle spasms. 12/29/23  Yes Tobie Yetta HERO, MD  Multiple Vitamin (MULTIVITAMIN WITH MINERALS) TABS tablet Take 1 tablet by mouth in the morning.   Yes [provider]  Multiple  Vitamins-Minerals (ICAPS AREDS FORMULA PO) Take 2 tablets by mouth daily.   Yes [provider]  Nutritional Supplements (FEEDING SUPPLEMENT, NEPRO CARB STEADY,) LIQD Take 237 mLs by mouth 2 (two) times daily between meals. Patient taking differently: Take 237 mLs by mouth 2 (two) times daily between meals. Premier 12/29/23  Yes Patel, Pranav M, MD  Olopatadine  HCl (PATADAY  OP) Place 2 drops into both eyes daily as needed (allergies).   Yes [provider]  OXYGEN Inhale 2 L into the lungs See admin instructions. Inhale 3L of Oxygen everynight at bedside. May uses throughout the day as needed.   Yes [provider]  pantoprazole  (PROTONIX ) 40 MG tablet Take 1 tablet (40 mg total) by mouth 2 (two) times daily. 04/12/21  Yes Angiulli, Daniel J, PA-C  polyethylene glycol powder (GLYCOLAX /MIRALAX ) 17 GM/SCOOP powder Take 17 g by mouth daily as needed for mild constipation or moderate constipation. 11/15/20  Yes [provider]  Probiotic Product (ALIGN) 10 MG CAPS Take 10 mg by mouth daily.   Yes [provider]  traZODone  (DESYREL ) 50  MG tablet Take 75 mg by mouth at bedtime. 05/11/23  Yes [provider]  TRESIBA  FLEXTOUCH 200 UNIT/ML FlexTouch Pen Inject 20 Units into the skin in the morning. 12/04/24  Yes [provider]  zinc  sulfate 220 (50 Zn) MG capsule Take 1 capsule (220 mg total) by mouth daily. 04/12/21  Yes Angiulli, Toribio PARAS, PA-C  albuterol  (PROVENTIL ) (2.5 MG/3ML) 0.083% nebulizer solution Take 2.5 mg by nebulization 2 (two) times daily as needed for wheezing or shortness of breath.    [provider]  benzonatate  (TESSALON ) 100 MG capsule Take 1 capsule (100 mg total) by mouth 3 (three) times daily. Patient taking differently: Take 100 mg by mouth 3 (three) times daily as needed for cough. 12/29/23   Tobie Yetta HERO, MD  dextromethorphan  (DELSYM ) 30 MG/5ML liquid Take 2.5 mLs (15 mg total) by mouth 2 (two) times  daily. Patient taking differently: Take 15 mg by mouth daily as needed for cough. 12/29/23   Tobie Yetta HERO, MD  EPINEPHrine  0.3 mg/0.3 mL IJ SOAJ injection Inject 0.3 mg into the muscle as needed for anaphylaxis. 07/26/24   Ghimire, Donalda HERO, MD  mupirocin  ointment (BACTROBAN ) 2 % Apply 1 Application topically daily as needed (Rash). 06/06/23   [provider]  ondansetron  (ZOFRAN ) 4 MG tablet Take 4 mg by mouth as needed for nausea or vomiting. 06/15/23   [provider]  oxyCODONE -acetaminophen  (PERCOCET) 7.5-325 MG tablet Take 1 tablet by mouth every 4 (four) hours as needed for moderate pain (pain score 4-6) or severe pain (pain score 7-10). 11/14/24   [provider]  triamcinolone cream (KENALOG) 0.1 % Apply 1 Application topically daily as needed (Rash).    [provider]         CRITICAL CARE Performed by: [name] Pesa   Total critical care time: 38 minutes  Critical care time was exclusive of separately billable procedures and treating other patients.  Critical care was necessary to treat or prevent imminent or life-threatening deterioration.  Critical care was time spent personally by me on the following activities: development of treatment plan with patient and/or surrogate as well as nursing, discussions with consultants, evaluation of patient's response to treatment, examination of patient, obtaining history from patient or surrogate, ordering and performing treatments and interventions, ordering and review of laboratory studies, ordering and review of radiographic studies, pulse oximetry and re-evaluation of patient's condition.    [name] Pesa, NP [facility] 12/13/2024, 9:31 AM  See Amion for pager If no response to pager , please call [phone] until 7pm After 7:00 pm call Elink  [phone]              [1]  Allergies Allergen Reactions   Codeine Hives and Itching   Ofev  [Nintedanib ] Diarrhea     SEVERE DIARRHEA   Pirfenidone  Diarrhea and Other (See Comments)    Esbriet  (Pirfenidone ) causes elevated LFTs. D/C on 06/14/17 and SEVERE DIARRHEA    Amoxicillin Itching   "

## 2024-12-14 ENCOUNTER — Inpatient Hospital Stay (HOSPITAL_COMMUNITY)

## 2024-12-14 ENCOUNTER — Encounter (HOSPITAL_COMMUNITY): Payer: Self-pay | Admitting: Orthopedic Surgery

## 2024-12-14 DIAGNOSIS — Z89511 Acquired absence of right leg below knee: Secondary | ICD-10-CM

## 2024-12-14 DIAGNOSIS — E440 Moderate protein-calorie malnutrition: Secondary | ICD-10-CM | POA: Insufficient documentation

## 2024-12-14 LAB — RENAL FUNCTION PANEL
Albumin: 3.1 g/dL — ABNORMAL LOW (ref 3.5–5.0)
Anion gap: 13 (ref 5–15)
BUN: 93 mg/dL — ABNORMAL HIGH (ref 8–23)
CO2: 24 mmol/L (ref 22–32)
Calcium: 8 mg/dL — ABNORMAL LOW (ref 8.9–10.3)
Chloride: 88 mmol/L — ABNORMAL LOW (ref 98–111)
Creatinine, Ser: 4.13 mg/dL — ABNORMAL HIGH (ref 0.61–1.24)
GFR, Estimated: 14 mL/min — ABNORMAL LOW
Glucose, Bld: 341 mg/dL — ABNORMAL HIGH (ref 70–99)
Phosphorus: 4.2 mg/dL (ref 2.5–4.6)
Potassium: 5.3 mmol/L — ABNORMAL HIGH (ref 3.5–5.1)
Sodium: 125 mmol/L — ABNORMAL LOW (ref 135–145)

## 2024-12-14 LAB — BASIC METABOLIC PANEL WITH GFR
Anion gap: 14 (ref 5–15)
BUN: 82 mg/dL — ABNORMAL HIGH (ref 8–23)
CO2: 22 mmol/L (ref 22–32)
Calcium: 8.2 mg/dL — ABNORMAL LOW (ref 8.9–10.3)
Chloride: 89 mmol/L — ABNORMAL LOW (ref 98–111)
Creatinine, Ser: 3.67 mg/dL — ABNORMAL HIGH (ref 0.61–1.24)
GFR, Estimated: 16 mL/min — ABNORMAL LOW
Glucose, Bld: 221 mg/dL — ABNORMAL HIGH (ref 70–99)
Potassium: 4.9 mmol/L (ref 3.5–5.1)
Sodium: 125 mmol/L — ABNORMAL LOW (ref 135–145)

## 2024-12-14 LAB — BLOOD GAS, VENOUS
Acid-base deficit: 2.2 mmol/L — ABNORMAL HIGH (ref 0.0–2.0)
Bicarbonate: 24.2 mmol/L (ref 20.0–28.0)
Drawn by: 71468
O2 Saturation: 93 %
Patient temperature: 37.1
pCO2, Ven: 47 mmHg (ref 44–60)
pH, Ven: 7.32 (ref 7.25–7.43)
pO2, Ven: 66 mmHg — ABNORMAL HIGH (ref 32–45)

## 2024-12-14 LAB — CBC
HCT: 26.6 % — ABNORMAL LOW (ref 39.0–52.0)
Hemoglobin: 8.7 g/dL — ABNORMAL LOW (ref 13.0–17.0)
MCH: 29.8 pg (ref 26.0–34.0)
MCHC: 32.7 g/dL (ref 30.0–36.0)
MCV: 91.1 fL (ref 80.0–100.0)
Platelets: 153 K/uL (ref 150–400)
RBC: 2.92 MIL/uL — ABNORMAL LOW (ref 4.22–5.81)
RDW: 14.5 % (ref 11.5–15.5)
WBC: 16.7 K/uL — ABNORMAL HIGH (ref 4.0–10.5)
nRBC: 0 % (ref 0.0–0.2)

## 2024-12-14 LAB — GLUCOSE, CAPILLARY
Glucose-Capillary: 205 mg/dL — ABNORMAL HIGH (ref 70–99)
Glucose-Capillary: 271 mg/dL — ABNORMAL HIGH (ref 70–99)
Glucose-Capillary: 303 mg/dL — ABNORMAL HIGH (ref 70–99)
Glucose-Capillary: 307 mg/dL — ABNORMAL HIGH (ref 70–99)
Glucose-Capillary: 324 mg/dL — ABNORMAL HIGH (ref 70–99)
Glucose-Capillary: 349 mg/dL — ABNORMAL HIGH (ref 70–99)

## 2024-12-14 LAB — MAGNESIUM: Magnesium: 1.9 mg/dL (ref 1.7–2.4)

## 2024-12-14 LAB — PHOSPHORUS: Phosphorus: 4.5 mg/dL (ref 2.5–4.6)

## 2024-12-14 LAB — VANCOMYCIN, RANDOM: Vancomycin Rm: 21 ug/mL

## 2024-12-14 MED ORDER — NOREPINEPHRINE 4 MG/250ML-% IV SOLN
0.0000 ug/min | INTRAVENOUS | Status: DC
  Administered 2024-12-14: 13 ug/min via INTRAVENOUS
  Administered 2024-12-15: 11 ug/min via INTRAVENOUS
  Administered 2024-12-15: 10 ug/min via INTRAVENOUS
  Filled 2024-12-14 (×2): qty 250

## 2024-12-14 MED ORDER — PRISMASOL BGK 4/2.5 32-4-2.5 MEQ/L EC SOLN: Status: DC

## 2024-12-14 MED ORDER — HEPARIN SODIUM (PORCINE) 1000 UNIT/ML DIALYSIS
1000.0000 [IU] | INTRAMUSCULAR | Status: DC | PRN
  Administered 2024-12-16: 2400 [IU] via INTRAVENOUS_CENTRAL
  Filled 2024-12-14: qty 3

## 2024-12-14 MED ORDER — HEPARIN SODIUM (PORCINE) 1000 UNIT/ML DIALYSIS
1000.0000 [IU] | INTRAMUSCULAR | Status: DC | PRN
  Administered 2024-12-14: 2400 [IU] via INTRAVENOUS_CENTRAL
  Filled 2024-12-14: qty 3
  Filled 2024-12-14: qty 6

## 2024-12-14 MED ORDER — VASOPRESSIN 20 UNITS/100 ML INFUSION FOR SHOCK
0.0000 [IU]/min | INTRAVENOUS | Status: DC
  Administered 2024-12-14 – 2024-12-15 (×2): 0.03 [IU]/min via INTRAVENOUS
  Filled 2024-12-14 (×2): qty 100

## 2024-12-14 MED ORDER — MAGNESIUM SULFATE 2 GM/50ML IV SOLN
2.0000 g | Freq: Once | INTRAVENOUS | Status: AC
  Administered 2024-12-14: 2 g via INTRAVENOUS
  Filled 2024-12-14: qty 50

## 2024-12-14 MED ORDER — VANCOMYCIN HCL 1750 MG/350ML IV SOLN
1750.0000 mg | Freq: Once | INTRAVENOUS | Status: AC
  Administered 2024-12-14: 1750 mg via INTRAVENOUS
  Filled 2024-12-14: qty 350

## 2024-12-14 MED ORDER — HYDROXYZINE HCL 25 MG PO TABS
25.0000 mg | ORAL_TABLET | Freq: Every evening | ORAL | Status: DC | PRN
  Administered 2024-12-16 – 2024-12-24 (×5): 25 mg
  Filled 2024-12-14 (×5): qty 1

## 2024-12-14 MED ORDER — FUROSEMIDE 10 MG/ML IJ SOLN
5.0000 mg/h | INTRAVENOUS | Status: DC
  Administered 2024-12-14: 5 mg/h via INTRAVENOUS
  Filled 2024-12-14: qty 20

## 2024-12-14 MED ORDER — INSULIN GLARGINE 100 UNIT/ML ~~LOC~~ SOLN
15.0000 [IU] | Freq: Two times a day (BID) | SUBCUTANEOUS | Status: DC
  Administered 2024-12-14 – 2024-12-15 (×2): 15 [IU] via SUBCUTANEOUS
  Filled 2024-12-14 (×3): qty 0.15

## 2024-12-14 MED ORDER — TRAZODONE HCL 50 MG PO TABS
75.0000 mg | ORAL_TABLET | Freq: Every evening | ORAL | Status: DC | PRN
  Administered 2024-12-14 – 2024-12-24 (×5): 75 mg
  Filled 2024-12-14 (×7): qty 2

## 2024-12-14 MED ORDER — DEXTROMETHORPHAN POLISTIREX ER 30 MG/5ML PO SUER: 15.0000 mg | Freq: Every day | ORAL | Status: DC | PRN

## 2024-12-14 MED ORDER — PIPERACILLIN-TAZOBACTAM 3.375 G IVPB
3.3750 g | Freq: Four times a day (QID) | INTRAVENOUS | Status: DC
  Administered 2024-12-14 – 2024-12-17 (×11): 3.375 g via INTRAVENOUS
  Filled 2024-12-14 (×11): qty 50

## 2024-12-14 MED ORDER — ACETAMINOPHEN 325 MG PO TABS
325.0000 mg | ORAL_TABLET | Freq: Four times a day (QID) | ORAL | Status: DC | PRN
  Administered 2024-12-14 – 2024-12-22 (×6): 650 mg
  Filled 2024-12-14 (×5): qty 2

## 2024-12-14 MED ORDER — OXYCODONE HCL 5 MG PO TABS
5.0000 mg | ORAL_TABLET | ORAL | Status: DC | PRN
  Administered 2024-12-14 – 2024-12-22 (×11): 5 mg
  Filled 2024-12-14 (×11): qty 1

## 2024-12-14 NOTE — Consult Note (Signed)
 Renal Service Consult Note [facility] Lamar JONETTA Fret, MD  Patient: Shane [name] Date: 12/14/2024 Requesting Physician: Dr. Sharie  Reason for Consult: Renal failure HPI: The patient is a 84 y.o. year-old w/ PMH of R BKA, anemia, CKD 3, DM, HTN, HL, hx idiopathic pulm fibrosis, OSA who was admitted after L BKA surgery done for gangrene of L foot by Dr Harden on 1/07.  Post-op was hypotensive and medical team consulted. There was 3g Hb drop and decreasing [name]. Got IVFs but bp's didn't respond and CCM consulted and pt moved to ICU 1/08. Creat up to 2.5 (b/l 1.5) yesterday. Hx of EF 30%, not vol overloaded at that time per pmd. CCM saw pt and felt he was not toxic appearing, mild ^LA but couldn't give more IVF's d/t cardiac status and pt having some SOB already after some IVFs. Echo ordered and IV lasix  tried but [name] dropped to < 200 cc 10/09 yesterday. Creat cont'd to ^ up to 3.6 today. Still c/o SOB. We are asked to see for CRRT.    Pt seen in ICU, sister at bedside. Pt c/o SOB, only main c/o. Not good historian and looks visibly uncomfortable.    ROS - n/a    Past Medical History  Past Medical History:  Diagnosis Date   Acute blood loss anemia    Anxiety    AV block, Mobitz 1    Cataract    Chronic kidney disease    d/t DM   CKD (chronic kidney disease), stage III (HCC)    COVID-19 virus infection 07/18/2020   Last Assessment & Plan:   Formatting of this note might be different from the original.  Immunocompromise high risk patient  -ID consulted for further therapies and will administer regeneron as OP tomorrow   -Decadron  discontinued as patient is off O2     Depression    Diabetes mellitus    Vgo disposal insulin  bolus  simular to insulin  pump   Dyspnea    GERD (gastroesophageal reflux disease)    History of kidney stones    passed   Hyperlipidemia    Hypertension    Idiopathic pulmonary fibrosis (HCC) 11/2016   ILD (interstitial lung disease) (HCC)     Moderate aortic stenosis    a. 10/2019 Echo: EF 55-60%, Gr2 DD. Nl RV.    Neuromuscular disorder (HCC)    Neuropathy associated with endocrine disorder    Nonobstructive CAD (coronary artery disease)    a. 2012 Cath: mod, nonobs dzs; b. 10/2016 MV: EF 60%, no ischemia.   OSA on CPAP 05/05/2017   Unattended Home Sleep Test 7/2/813-AHI 38.6/hour, desaturation to 64%, body weight 261 pounds   PONV (postoperative nausea and vomiting)    Postoperative anemia due to acute blood loss 11/07/2020   Postoperative hemorrhagic shock 03/20/2021   Sleep apnea     uses cpap asked to bring mask and tubing   Past Surgical History  Past Surgical History:  Procedure Laterality Date   ABDOMINAL AORTOGRAM W/LOWER EXTREMITY N/A 12/10/2020   Procedure: ABDOMINAL AORTOGRAM W/LOWER EXTREMITY;  Surgeon: Magda Debby SAILOR, MD;  Location: [facility] INVASIVE CV LAB;  Service: Cardiovascular;  Laterality: N/A;   AMPUTATION Right 01/22/2021   Procedure: 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;  Surgeon: Harden Jerona GAILS, MD;  Location: [facility] OR;  Service: Orthopedics;  Laterality: Right;   AMPUTATION Right 03/17/2021   Procedure: RIGHT BELOW KNEE AMPUTATION;  Surgeon: Harden Jerona GAILS, MD;  Location: [facility] OR;  Service: Orthopedics;  Laterality: Right;   AMPUTATION Left 12/11/2024   Procedure: AMPUTATION BELOW KNEE;  Surgeon: Harden Jerona GAILS, MD;  Location: [facility] OR;  Service: Orthopedics;  Laterality: Left;   ANKLE FUSION Right 01/22/2021   Procedure: RIGHT FOOT TIBIOCALCANEAL FUSION;  Surgeon: Harden Jerona GAILS, MD;  Location: [facility] OR;  Service: Orthopedics;  Laterality: Right;   ANTERIOR FUSION CERVICAL SPINE  2012   APPLICATION OF WOUND VAC Left 12/11/2024   Procedure: APPLICATION, WOUND VAC;  Surgeon: Harden Jerona GAILS, MD;  Location: [facility] OR;  Service: Orthopedics;  Laterality: Left;   CARDIAC CATHETERIZATION  2011   CARDIAC CATHETERIZATION N/A 11/09/2016   Procedure: Right Heart Cath;  Surgeon: Victory LELON Sharps, MD;  Location: [facility] INVASIVE CV LAB;  Service:  Cardiovascular;  Laterality: N/A;   carpel tunnel     left wrist   CATARACT EXTRACTION     CATARACT EXTRACTION W/ INTRAOCULAR LENS  IMPLANT, BILATERAL  2013   CERVICAL LAMINECTOMY  2012   COLONOSCOPY N/A 01/14/2013   Procedure: COLONOSCOPY;  Surgeon: Norleen LOISE Kiang, MD;  Location: [facility] ENDOSCOPY;  Service: Endoscopy;  Laterality: N/A;   CORONARY ARTERY BYPASS GRAFT  11/04/2020   LIMA-LAD, SVG-OM1, SVG-PDA (Dr Norleen [facility]) dc 11/18/2020   EYE SURGERY     I & D EXTREMITY Right 02/19/2021   Procedure: RIGHT ANKLE DEBRIDEMENT AND PLACEMENT ANTIBIOTIC BEADS;  Surgeon: Harden Jerona GAILS, MD;  Location: [facility] OR;  Service: Orthopedics;  Laterality: Right;   KNEE SURGERY  1998   left   LEFT HEART CATH AND CORONARY ANGIOGRAPHY N/A 07/10/2020   Procedure: LEFT HEART CATH AND CORONARY ANGIOGRAPHY;  Surgeon: Wonda Sharper, MD;  Location: [facility] INVASIVE CV LAB;  Service: Cardiovascular;  Laterality: N/A;   LUMBAR LAMINECTOMY  2003   LUNG BIOPSY Left 12/26/2016   Procedure: LUNG BIOPSY;  Surgeon: Elspeth JAYSON Millers, MD;  Location: [facility] OR;  Service: Thoracic;  Laterality: Left;   PACEMAKER IMPLANT N/A 03/30/2020   Procedure: PACEMAKER IMPLANT;  Surgeon: Waddell Danelle LELON, MD;  Location: [facility] INVASIVE CV LAB;  Service: Cardiovascular;  Laterality: N/A;   PERIPHERAL VASCULAR INTERVENTION Right 12/10/2020   Procedure: PERIPHERAL VASCULAR INTERVENTION;  Surgeon: Magda Debby LOISE, MD;  Location: [facility] INVASIVE CV LAB;  Service: Cardiovascular;  Laterality: Right;  SFA   POSTERIOR FUSION CERVICAL SPINE  2012   PPM GENERATOR REMOVAL N/A 12/14/2020   Procedure: PPM GENERATOR REMOVAL;  Surgeon: Waddell Danelle LELON, MD;  Location: [facility] INVASIVE CV LAB;  Service: Cardiovascular;  Laterality: N/A;   TEE WITHOUT CARDIOVERSION N/A 12/11/2020   Procedure: TRANSESOPHAGEAL ECHOCARDIOGRAM (TEE);  Surgeon: Barbaraann Darryle Debby, MD;  Location: [facility] ENDOSCOPY;  Service: Cardiovascular;  Laterality: N/A;   TRIGGER FINGER RELEASE  2011   4th finger left  hand   VIDEO ASSISTED THORACOSCOPY Left 12/26/2016   Procedure: VIDEO ASSISTED THORACOSCOPY;  Surgeon: Elspeth JAYSON Millers, MD;  Location: [facility] OR;  Service: Thoracic;  Laterality: Left;   VIDEO BRONCHOSCOPY N/A 12/26/2016   Procedure: VIDEO BRONCHOSCOPY;  Surgeon: Elspeth JAYSON Millers, MD;  Location: [facility] OR;  Service: Thoracic;  Laterality: N/A;   Family History  Family History  Problem Relation Age of Onset   Diabetes Mellitus II Mother    Emphysema Father 12   Heart attack Father    Colon cancer Neg Hx    Esophageal cancer Neg Hx    Rectal cancer Neg Hx    Stomach cancer Neg Hx    Social History  reports that he has never smoked. He  has been exposed to tobacco smoke. He has never used smokeless tobacco. He reports that he does not drink alcohol and does not use drugs. Allergies Allergies[1] Home medications Prior to Admission medications  Medication Sig Start Date End Date Taking? Authorizing Provider  acetaminophen  (TYLENOL ) 500 MG tablet Take 1,000 mg by mouth every 8 (eight) hours as needed for mild pain (pain score 1-3) or moderate pain (pain score 4-6).   Yes [provider]  albuterol  (VENTOLIN  HFA) 108 (90 Base) MCG/ACT inhaler Inhale 2 puffs into the lungs every 4 (four) hours as needed for wheezing or shortness of breath. 04/12/21  Yes Angiulli, Toribio PARAS, PA-C  apixaban  (ELIQUIS ) 5 MG TABS tablet Take 5 mg by mouth 2 (two) times daily.   Yes [provider]  Ascorbic Acid  (VITAMIN C ) 1000 MG tablet Take 1,000 mg by mouth daily.   Yes [provider]  atorvastatin  (LIPITOR ) 80 MG tablet TAKE 1 TABLET BY MOUTH EVERY DAY 07/25/22  Yes Pietro Redell RAMAN, MD  diphenhydrAMINE  (BENADRYL ) 50 MG tablet Take 50 mg by mouth at bedtime as needed for itching.   Yes [provider]  docusate sodium  (COLACE) 100 MG capsule Take 1 capsule (100 mg total) by mouth 2 (two) times daily. Patient taking differently: Take 100 mg by mouth at bedtime. 12/29/23  Yes Tobie Yetta HERO, MD  escitalopram  (LEXAPRO ) 10 MG tablet Take 1 tablet (10 mg total) by mouth daily. 04/12/21  Yes Angiulli, Toribio PARAS, PA-C  furosemide  (LASIX ) 20 MG tablet Take 20 mg by mouth daily with lunch.   Yes [provider]  furosemide  (LASIX ) 40 MG tablet Take 1 tablet (40 mg total) by mouth 2 (two) times daily. 04/14/24 04/14/25 Yes Fairy Frames, MD  gabapentin  (NEURONTIN ) 100 MG capsule Take 1 capsule (100 mg total) by mouth 3 (three) times daily. Patient taking differently: Take 100 mg by mouth 3 (three) times daily as needed (Pain). 12/29/23  Yes Tobie Yetta HERO, MD  HUMALOG  KWIKPEN 100 UNIT/ML KwikPen Inject 10-15 Units into the skin 3 (three) times daily. Can take 15-18 Units with meals while on Prednisone  for 3days Patient taking differently: Inject 15-20 Units into the skin 3 (three) times daily with meals. Sliding scale 04/14/24  Yes Fairy Frames, MD  HYDROcodone -acetaminophen  (NORCO/VICODIN) 5-325 MG tablet Take 1 tablet by mouth at bedtime as needed for moderate pain (pain score 4-6) or severe pain (pain score 7-10).   Yes [provider]  hydrOXYzine  (ATARAX ) 25 MG tablet Take 25 mg by mouth at bedtime. 03/06/24  Yes [provider]  ibuprofen (ADVIL) 200 MG tablet Take 400 mg by mouth every 6 (six) hours as needed for moderate pain (pain score 4-6) or mild pain (pain score 1-3).   Yes [provider]  lisinopril  (ZESTRIL ) 10 MG tablet Take 10 mg by mouth daily. 05/08/24  Yes [provider]  LORazepam  (ATIVAN ) 1 MG tablet Take 1 tablet (1 mg total) by mouth at bedtime as needed for sleep. 12/29/23  Yes Tobie Yetta HERO, MD  Magnesium  200 MG TABS Take 200 mg by mouth at bedtime. 03/06/24  Yes [provider]  methocarbamol  (ROBAXIN ) 500 MG tablet Take 1 tablet (500 mg total) by mouth every 8 (eight) hours as needed for muscle spasms. 12/29/23  Yes Tobie Yetta HERO, MD  Multiple Vitamin (MULTIVITAMIN WITH MINERALS) TABS tablet Take 1 tablet by  mouth in the morning.   Yes [provider]  Multiple Vitamins-Minerals (ICAPS AREDS FORMULA PO)  Take 2 tablets by mouth daily.   Yes [provider]  Nutritional Supplements (FEEDING SUPPLEMENT, NEPRO CARB STEADY,) LIQD Take 237 mLs by mouth 2 (two) times daily between meals. Patient taking differently: Take 237 mLs by mouth 2 (two) times daily between meals. Premier 12/29/23  Yes Tobie Yetta HERO, MD  Olopatadine  HCl (PATADAY  OP) Place 2 drops into both eyes daily as needed (allergies).   Yes [provider]  OXYGEN Inhale 2 L into the lungs See admin instructions. Inhale 3L of Oxygen everynight at bedside. May uses throughout the day as needed.   Yes [provider]  pantoprazole  (PROTONIX ) 40 MG tablet Take 1 tablet (40 mg total) by mouth 2 (two) times daily. 04/12/21  Yes Angiulli, Daniel J, PA-C  polyethylene glycol powder (GLYCOLAX /MIRALAX ) 17 GM/SCOOP powder Take 17 g by mouth daily as needed for mild constipation or moderate constipation. 11/15/20  Yes [provider]  Probiotic Product (ALIGN) 10 MG CAPS Take 10 mg by mouth daily.   Yes [provider]  traZODone  (DESYREL ) 50 MG tablet Take 75 mg by mouth at bedtime. 05/11/23  Yes [provider]  TRESIBA  FLEXTOUCH 200 UNIT/ML FlexTouch Pen Inject 20 Units into the skin in the morning. 12/04/24  Yes [provider]  zinc  sulfate 220 (50 Zn) MG capsule Take 1 capsule (220 mg total) by mouth daily. 04/12/21  Yes Angiulli, Toribio PARAS, PA-C  albuterol  (PROVENTIL ) (2.5 MG/3ML) 0.083% nebulizer solution Take 2.5 mg by nebulization 2 (two) times daily as needed for wheezing or shortness of breath.    [provider]  EPINEPHrine  0.3 mg/0.3 mL IJ SOAJ injection Inject 0.3 mg into the muscle as needed for anaphylaxis. 07/26/24   Ghimire, Donalda HERO, MD  mupirocin  ointment (BACTROBAN ) 2 % Apply 1 Application topically daily as needed (Rash). 06/06/23   [provider]   triamcinolone cream (KENALOG) 0.1 % Apply 1 Application topically daily as needed (Rash).    [provider]     Vitals:   12/14/24 0911 12/14/24 0915 12/14/24 0930 12/14/24 0945  BP:  (!) 114/59 (!) 118/57 (!) 105/53  Pulse: (!) 105 (!) 104 (!) 103 (!) 106  Resp: 17 15 17 20   Temp: 99.8 F (37.7 C)     TempSrc: Axillary     SpO2: 99% 98% 98% 97%  Weight:      Height:       Exam Gen elderly male, lethargic, mild ^wob, [place] O2 Sclera anicteric, throat clear  +jvd Chest clear bilat to bases RRR no MRG Abd soft ntnd no mass or ascites +bs, obese Ext bilat BKA, L side 1+ edema, L > R 1+ UE edema Neuro is lethargic as above   Home bp meds: Lasix  Lisinopril  Home O2    Date   Creat  eGFR (ml/min) 2008- 2012  1.01- 2.80 2017- 2019  1.30- 1.79 2021   1.01- 1.45 2022   1.02- 3.31   2024   1.57- 1.87 Jan -feb 2025  5.29 >> 1.28 10 >> 56 ml/min  May 2025  2.66 >> 1.60 Aug 2025  1.70 >> 1.12 40 >> > 60 ml/min 1/07   1.41 1/08   2.57 1/09   2.62 1/10   3.67  Inpatient meds: Lisinopril  on 1/07 x 1 Midodrine  10 tid started 1/09 IV lasix  dripo 1/10 at 5 mg/ hr LR bolus 2 L on 1/08 IV vanc started 1/09 No CT contrast   UA 1/09 - yellow, prot 100, 0-5 rbc,  6-10 wbc UNa, UCr pending   Renal US  pend CXR 1/10 -> IMPRESSION: Chronic pulmonary fibrosis. Stable ventilation. Labs today --> Na 125, K+ 4.9, BUN 82, creat 3.67, egfr 16 ml/min Total I/O 6 L in and 1.9 L out     Assessment/ Plan:  # AKI on CKD 3a - b/l creatinine 1.3- 1.7, eGFR 40- 56 ml/min, from jan- aug 2025.  - AKI post L BKA w/ post -op hypotension and good then declining [name] - did not respond to IV lasix  - creat up to 3.5 today - get renal US , urine lytes - suspect AKI due to hypotension / hypoperfusion  - plan is for CRRT  # Volume  - bilat mild-mod L > R stump edema, mild UE edema - CXR not helpful given severe IPF  - EF 30%, SOB is worrisome, likely vol related - plan UF 50-100 cc/hr  w/ CRRT  # hyponatremia - Na+ down to 125 today, likely hypervolemic - get vol down w/ CRRT  # sp L BKA  - on 1/07       Myer Fret  MD [facility] 12/14/2024, 11:21 AM  Recent Labs  Lab 12/13/24 0205 12/14/24 0225  CREATININE 2.62* 3.67*  K 4.7 4.9   Inpatient medications:  sodium chloride    Intravenous Once   acidophilus  1 capsule Oral Daily   vitamin C   1,000 mg Per Tube Daily   atorvastatin   80 mg Per Tube Daily   Chlorhexidine  Gluconate Cloth  6 each Topical Q0600   docusate  100 mg Per Tube Daily   escitalopram   10 mg Per Tube Daily   feeding supplement (PROSource TF20)  60 mL Per Tube BID   ferrous sulfate   300 mg Per Tube Q breakfast   gabapentin   100 mg Per Tube Q8H   heparin  injection (subcutaneous)  5,000 Units Subcutaneous Q8H   insulin  aspart  0-20 Units Subcutaneous Q4H   insulin  glargine  10 Units Subcutaneous Daily   midodrine   10 mg Per Tube TID WC   multivitamin with minerals  1 tablet Per Tube q AM   nutrition supplement (JUVEN)  1 packet Per Tube BID BM   pantoprazole   40 mg Oral BID   polyethylene glycol  17 g Per Tube BID   sodium chloride  flush  3 mL Intravenous Q12H   thiamine   100 mg Per Tube Daily   vancomycin  variable dose per unstable renal function (pharmacist dosing)   Does not apply See admin instructions    feeding supplement (VITAL 1.5 CAL) 45 mL/hr at 12/14/24 0900   furosemide  (LASIX ) 200 mg in dextrose  5 % 100 mL (2 mg/mL) infusion 5 mg/hr (12/14/24 0900)   norepinephrine  (LEVOPHED ) Adult infusion 10 mcg/min (12/14/24 0907)   piperacillin -tazobactam (ZOSYN )  IV Stopped (12/14/24 0555)   vancomycin  1,750 mg (12/14/24 1020)   acetaminophen , albuterol , dextromethorphan , EPINEPHrine , fentaNYL  (SUBLIMAZE ) injection, hydrOXYzine , methocarbamol , mupirocin  ointment, olopatadine , ondansetron , oxyCODONE , phenol, potassium chloride , sodium chloride  flush, traZODone       [1]  Allergies Allergen Reactions   Codeine Hives and Itching   Ofev   [Nintedanib ] Diarrhea    SEVERE DIARRHEA   Pirfenidone  Diarrhea and Other (See Comments)    Esbriet  (Pirfenidone ) causes elevated LFTs. D/C on 06/14/17 and SEVERE DIARRHEA    Amoxicillin Itching

## 2024-12-14 NOTE — Progress Notes (Addendum)
 Continued low urinary out put,100cc in 8hours. Continues to be on 10mcg of levophed . Map right at 67. Informed Elink   Na 125, he's been really lethargic since yesterday, updated elink md

## 2024-12-14 NOTE — Progress Notes (Signed)
 eLink Physician-Brief Progress Note Patient Name: Shane [name] DOB: Nov 06, 1941 MRN: [id number]   Date of Service  12/14/2024  HPI/Events of Note  Patient with oliguria, EF  30 - 35%, IVC < 50 %, CXR with pulmonary edema, patient is currently on Levophed  gtt at 10 mcg.  eICU Interventions  Lasix  gtt ordered at 5 mg / hour x 24 hours.        Nevelyn Mellott U Corie Vavra 12/14/2024, 4:34 AM

## 2024-12-14 NOTE — Procedures (Signed)
 I have reviewed the CRRT procedure and made adjustments as needed.  Myer Fret MD  [facility] 12/14/2024, 4:11 PM

## 2024-12-14 NOTE — Plan of Care (Signed)
" °  Problem: Education: Goal: Knowledge of the prescribed therapeutic regimen will improve Outcome: Progressing   Problem: Bowel/Gastric: Goal: Gastrointestinal status for postoperative course will improve Outcome: Progressing   Problem: Cardiac: Goal: Ability to maintain an adequate cardiac output Outcome: Progressing Goal: Will show no evidence of cardiac arrhythmias Outcome: Progressing   "

## 2024-12-14 NOTE — Progress Notes (Signed)
 Patient ID: Shane [name], male   DOB: [birth year], 83 y.o.   MRN: [id number] Patient is on luteal dose Levophed  as well as nutrition support and IV antibiotics.  He is more alert this morning and responds appropriately.  Patient need to continue mobilization he has swelling in both upper extremities.  VAC has 125 cc of drainage.

## 2024-12-14 NOTE — Progress Notes (Signed)
 "  NAME:  Shane [name], MRN:  [id number], DOB:  [birth year], LOS: 3 ADMISSION DATE:  12/11/2024, CONSULTATION DATE:  12/12/24 REFERRING MD:  Dr. Harden, CHIEF COMPLAINT:  hypotension   History of Present Illness:  84 year old male with past medical history as below, which is significant for chronic kidney disease stage III, diabetes, hypertension, hyperlipidemia, IPF, OSA on CPAP and at bedtime 2L [place], and HFrEF with LVEF 30 to 35%.  He presented to [facility] 1/7 after failing conservative outpatient therapy for ischemic lower extremity ulcers and left foot with gangrene.  He underwent transtibial amputation and was subsequently sent to the medical floor for recovery.  In the early a.m. hours 1/8 he became hypotensive which did improve temporarily with crystalloid bolus, however, he again became hypotensive with systolic blood pressures in the 70s and 80s.  PCCM was asked to evaluate on this basis.   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  Significant Hospital Events: Including procedures, antibiotic start and stop dates in addition to other pertinent events   1/7 admit for L transtibial amputation 1/7 1/8 hypotensive, tx to ICU on pressors, albumin , lactic  resolved  Interim History / Subjective:  Oliguria, only 100 mL overnight, Lasix  gtt. started overnight, levo 10    Objective    Blood pressure (!) 116/47, pulse 63, temperature 99.6 F (37.6 C), temperature source Axillary, resp. rate 12, height 6' (1.829 m), weight 110.3 kg, SpO2 99%.        Intake/Output Summary (Last 24 hours) at 12/14/2024 9277 Last data filed at 12/14/2024 0300 Gross per 24 hour  Intake 1328.12 ml  Output 180 ml  Net 1148.12 ml   Filed Weights   12/11/24 0635 12/14/24 0500  Weight: 103.4 kg 110.3 kg    Examination: General: Ill-appearing elderly male, lying in bed HEENT: NCAT Chest: Symmetrical chest movement, fine crackles bilateral CVS: S1-S2 normal, no murmur GI,: Soft nontender nondistended Extremities: Warm and dry, right BKA, left BKA-wound VAC dressing in place, some serosanguineous fluid. Neuro: Lethargic, oriented to person,  GU: Deferred   Resolved problem list    Assessment and Plan   84 year old male with history of CKD 3, DM, HFrEF 30-35%, prior right BKA, who presented with left foot gangrene s/p left transtibial amputation on 1/7, then presented with hypotension postprocedure likely due to septic shock. No bleeding, Hb stable, lactic acid 2.6  Oliguria, only 100 mL urinary output overnight, on Lasix  gtt., and on levo 10   Acute metabolic encephalopathy Circulatory shock likely due to septic shock Septic shock-likely due to left foot gangrene s/p left transtibial amputation on 1/7 Lactic acidosis HFrEF EF 30-35% HTN A-fib on home Eliquis  DM2 AKI on CKD 3 OSA on CPAP nightly, 2L [place] Severe PAD s/p bilateral BKA's   Plan - On Lasix  gtt. for oliguria, and levo requirement going up - Nephrology team is brought on board for CRRT - Will put HD cath on right IJ - Continue  antibiotics - Wean off vasopressors tolerated to maintain MAP> 65 - Monitor H&H - Continue subcu heparin  today, will resume Eliquis  when okay with  Ortho - Glucose management - Multimodal pain management - Night CPAP - Monitor and replace electrolyte as needed     Labs   CBC: Recent Labs  Lab 12/11/24 0746 12/12/24 0655 12/12/24 1029 12/13/24 0205 12/14/24 0225  WBC 8.9 11.8* 13.0* 15.4* 16.7*  NEUTROABS 6.1  --   --   --   --   HGB 12.0* 9.3* 9.0* 9.7* 8.7*  HCT 36.2* 27.6* 27.4* 28.7* 26.6*  MCV 90.7 89.3 91.9 90.0 91.1  PLT 194 166 151 180 153    Basic Metabolic Panel: Recent Labs  Lab 12/11/24 0746 12/12/24 0655 12/13/24 0205 12/13/24 1943 12/14/24 0225  NA 136 131* 128*  --  125*  K 3.6 4.7 4.7  --  4.9  CL 97* 96* 90*  --  89*  CO2 26 27 26   --  22  GLUCOSE 137* 138* 243*  --  221*  BUN 44* 60* 67*  --  82*  CREATININE 1.41* 2.57* 2.62*  --  3.67*  CALCIUM  9.0 8.3* 8.4*  --  8.2*  MG  --   --  1.5*  --  1.9  PHOS  --   --   --  4.2 4.5   GFR: Estimated Creatinine Clearance: 19.6 mL/min (A) (by C-G formula based on SCr of 3.67 mg/dL (H)). Recent Labs  Lab 12/12/24 0655 12/12/24 1029 12/12/24 2052 12/13/24 0205 12/14/24 0225  WBC 11.8* 13.0*  --  15.4* 16.7*  LATICACIDVEN  --  2.6* 1.6  --   --     Liver Function Tests: Recent Labs  Lab 12/11/24 0746  AST 20  ALT 13  ALKPHOS 124  BILITOT 0.5  PROT 7.1  ALBUMIN  3.6   No results for input(s): LIPASE, AMYLASE in the last 168 hours. No results for input(s): AMMONIA in the last 168 hours.  ABG    Component Value Date/Time   PHART 7.362 12/27/2016 0334   PCO2ART 45.4 12/27/2016 0334   PO2ART 115 (H) 12/27/2016 0334   HCO3 26.5 12/13/2024 1007   TCO2 24 07/24/2024 0019   ACIDBASEDEF 2.0 07/24/2024 0019   O2SAT 79.6 12/13/2024 1007     Coagulation Profile: No results for input(s): INR, PROTIME in the last 168 hours.  Cardiac Enzymes: No results for input(s): CKTOTAL, CKMB, CKMBINDEX, TROPONINI in the last 168 hours.  HbA1C: Hgb A1c MFr Bld  Date/Time Value Ref Range Status  12/11/2024 02:39 PM 7.6 (H) 4.8  - 5.6 % Final    Comment:    (NOTE) Diagnosis of Diabetes The following HbA1c ranges recommended by the American Diabetes Association (ADA) may be used as an aid in the diagnosis of diabetes mellitus.  Hemoglobin             Suggested A1C NGSP%              Diagnosis  <5.7                   Non Diabetic  5.7-6.4                Pre-Diabetic  >6.4                   Diabetic  <7.0                   Glycemic control for  adults with diabetes.    04/09/2024 11:38 PM 8.7 (H) 4.8 - 5.6 % Final    Comment:    (NOTE) Pre diabetes:          5.7%-6.4%  Diabetes:              >6.4%  Glycemic control for   <7.0% adults with diabetes     CBG: Recent Labs  Lab 12/13/24 1136 12/13/24 1517 12/13/24 1937 12/13/24 2335 12/14/24 0330  GLUCAP 187* 173* 183* 216* 205*   Allergies Allergies[1]   Home Medications  Prior to Admission medications  Medication Sig Start Date End Date Taking? Authorizing Provider  acetaminophen  (TYLENOL ) 500 MG tablet Take 1,000 mg by mouth every 8 (eight) hours as needed for mild pain (pain score 1-3) or moderate pain (pain score 4-6).   Yes [provider]  albuterol  (VENTOLIN  HFA) 108 (90 Base) MCG/ACT inhaler Inhale 2 puffs into the lungs every 4 (four) hours as needed for wheezing or shortness of breath. 04/12/21  Yes Angiulli, Toribio PARAS, PA-C  apixaban  (ELIQUIS ) 5 MG TABS tablet Take 5 mg by mouth 2 (two) times daily.   Yes [provider]  Ascorbic Acid  (VITAMIN C ) 1000 MG tablet Take 1,000 mg by mouth daily.   Yes [provider]  atorvastatin  (LIPITOR ) 80 MG tablet TAKE 1 TABLET BY MOUTH EVERY DAY 07/25/22  Yes Crenshaw, Redell RAMAN, MD  diphenhydrAMINE  (BENADRYL ) 50 MG tablet Take 50 mg by mouth at bedtime as needed for itching.   Yes [provider]  docusate sodium  (COLACE) 100 MG capsule Take 1 capsule (100 mg total) by mouth 2 (two) times daily. Patient taking differently: Take 100 mg by mouth  at bedtime. 12/29/23  Yes Tobie Yetta HERO, MD  escitalopram  (LEXAPRO ) 10 MG tablet Take 1 tablet (10 mg total) by mouth daily. 04/12/21  Yes Angiulli, Toribio PARAS, PA-C  furosemide  (LASIX ) 20 MG tablet Take 20 mg by mouth daily with lunch.   Yes [provider]  furosemide  (LASIX ) 40 MG tablet Take 1 tablet (40 mg total) by mouth 2 (two) times daily. 04/14/24 04/14/25 Yes Fairy Frames, MD  gabapentin  (NEURONTIN ) 100 MG capsule Take 1 capsule (100 mg total) by mouth 3 (three) times daily. Patient taking differently: Take 100 mg by mouth 3 (three) times daily as needed (Pain). 12/29/23  Yes Tobie Yetta HERO, MD  HUMALOG  KWIKPEN 100 UNIT/ML KwikPen Inject 10-15 Units into the skin 3 (three) times daily. Can take 15-18 Units with meals while on Prednisone  for 3days Patient taking differently: Inject 15-20 Units into the skin 3 (three) times daily with meals. Sliding scale 04/14/24  Yes Fairy Frames, MD  HYDROcodone  bit-homatropine [facility]) 5-1.5 MG/5ML syrup Take 5 mLs by mouth every 6 (six) hours as needed for cough. 12/29/23  Yes Tobie Yetta HERO, MD  HYDROcodone -acetaminophen  (NORCO/VICODIN) 5-325 MG tablet Take 1 tablet by mouth at bedtime as needed for moderate pain (pain score 4-6) or severe pain (pain score 7-10).   Yes [provider]  hydrOXYzine  (ATARAX ) 25 MG tablet Take 25 mg by mouth at bedtime. 03/06/24  Yes [provider]  ibuprofen (ADVIL) 200 MG tablet Take 400 mg by mouth every 6 (six) hours as needed for moderate pain (pain score 4-6) or mild pain (pain score 1-3).   Yes [provider]  lisinopril  (ZESTRIL ) 10 MG tablet Take 10 mg by mouth daily. 05/08/24  Yes [provider]  LORazepam  (ATIVAN ) 1 MG tablet Take 1 tablet (1  mg total) by mouth at bedtime as needed for sleep. 12/29/23  Yes Tobie Yetta HERO, MD  Magnesium  200 MG TABS Take 200 mg by mouth at bedtime. 03/06/24  Yes [provider]  methocarbamol  (ROBAXIN ) 500 MG tablet Take 1 tablet  (500 mg total) by mouth every 8 (eight) hours as needed for muscle spasms. 12/29/23  Yes Tobie Yetta HERO, MD  Multiple Vitamin (MULTIVITAMIN WITH MINERALS) TABS tablet Take 1 tablet by mouth in the morning.   Yes [provider]  Multiple Vitamins-Minerals (ICAPS AREDS FORMULA PO) Take 2 tablets by mouth daily.   Yes [provider]  Nutritional Supplements (FEEDING SUPPLEMENT, NEPRO CARB STEADY,) LIQD Take 237 mLs by mouth 2 (two) times daily between meals. Patient taking differently: Take 237 mLs by mouth 2 (two) times daily between meals. Premier 12/29/23  Yes Tobie Yetta HERO, MD  Olopatadine  HCl (PATADAY  OP) Place 2 drops into both eyes daily as needed (allergies).   Yes [provider]  OXYGEN Inhale 2 L into the lungs See admin instructions. Inhale 3L of Oxygen everynight at bedside. May uses throughout the day as needed.   Yes [provider]  pantoprazole  (PROTONIX ) 40 MG tablet Take 1 tablet (40 mg total) by mouth 2 (two) times daily. 04/12/21  Yes Angiulli, Daniel J, PA-C  polyethylene glycol powder (GLYCOLAX /MIRALAX ) 17 GM/SCOOP powder Take 17 g by mouth daily as needed for mild constipation or moderate constipation. 11/15/20  Yes [provider]  Probiotic Product (ALIGN) 10 MG CAPS Take 10 mg by mouth daily.   Yes [provider]  traZODone  (DESYREL ) 50 MG tablet Take 75 mg by mouth at bedtime. 05/11/23  Yes [provider]  TRESIBA  FLEXTOUCH 200 UNIT/ML FlexTouch Pen Inject 20 Units into the skin in the morning. 12/04/24  Yes [provider]  zinc  sulfate 220 (50 Zn) MG capsule Take 1 capsule (220 mg total) by mouth daily. 04/12/21  Yes Angiulli, Toribio PARAS, PA-C  albuterol  (PROVENTIL ) (2.5 MG/3ML) 0.083% nebulizer solution Take 2.5 mg by nebulization 2 (two) times daily as needed for wheezing or shortness of breath.    [provider]  benzonatate  (TESSALON ) 100 MG capsule Take 1 capsule (100 mg total) by mouth 3  (three) times daily. Patient taking differently: Take 100 mg by mouth 3 (three) times daily as needed for cough. 12/29/23   Tobie Yetta HERO, MD  dextromethorphan  (DELSYM ) 30 MG/5ML liquid Take 2.5 mLs (15 mg total) by mouth 2 (two) times daily. Patient taking differently: Take 15 mg by mouth daily as needed for cough. 12/29/23   Tobie Yetta HERO, MD  EPINEPHrine  0.3 mg/0.3 mL IJ SOAJ injection Inject 0.3 mg into the muscle as needed for anaphylaxis. 07/26/24   Ghimire, Donalda HERO, MD  mupirocin  ointment (BACTROBAN ) 2 % Apply 1 Application topically daily as needed (Rash). 06/06/23   [provider]  ondansetron  (ZOFRAN ) 4 MG tablet Take 4 mg by mouth as needed for nausea or vomiting. 06/15/23   [provider]  oxyCODONE -acetaminophen  (PERCOCET) 7.5-325 MG tablet Take 1 tablet by mouth every 4 (four) hours as needed for moderate pain (pain score 4-6) or severe pain (pain score 7-10). 11/14/24   [provider]  triamcinolone cream (KENALOG) 0.1 % Apply 1 Application topically daily as needed (Rash).    [provider]          CRITICAL CARE  Lenny Drought, MD  [facility] Prefer epic messenger for cross cover needs  My critical care time: 40 minutes  Critical care time was exclusive of separately billable procedures and treating other patients.  Critical care was necessary to treat or prevent imminent or life-threatening deterioration.  Critical care was time spent personally by me on the following activities: development of treatment plan with patient and/or surrogate as well as nursing, discussions with consultants, evaluation of patient's response to treatment, examination of patient, obtaining history from patient or surrogate, ordering and performing treatments and interventions, ordering and review of laboratory studies, ordering and review of radiographic studies, pulse oximetry, re-evaluation of patient's condition and  participation in multidisciplinary rounds.                [1]  Allergies Allergen Reactions   Codeine Hives and Itching   Ofev  [Nintedanib ] Diarrhea    SEVERE DIARRHEA   Pirfenidone  Diarrhea and Other (See Comments)    Esbriet  (Pirfenidone ) causes elevated LFTs. D/C on 06/14/17 and SEVERE DIARRHEA    Amoxicillin Itching   "

## 2024-12-14 NOTE — Progress Notes (Signed)
 Pharmacy Antibiotic Note  Shane [name] is a 84 y.o. 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  The following day, patient became hypotensive and transferred to the ICU.  He remains on pressor.  Pharmacy has been consulted for vancomycin  for septic shock.  He is also started on Zosyn .  Renal function worsening - SCr up to 3.67 (BL SCr 1.4). Patient is oliguric today- nephrology to start CRRT.  Afebrile, WBC up to 16.7 post-op. Vanc random resulted at 21 (slightly above <20 redosing goal), 13 hours post 2g IV loading dose.  Plan: Will redose vancomycin  1750 mg IV 8 hours after level resulted due to oliguria Patient to be initiated on CRRT- will draw VR in the morning Dose adjust Zosyn  for CRRT- Zosyn  3.375gm IV Q6H (30 minute infusion)  Monitor renal fxn, clinical progress, VR in AM and redose if < 20 mcg/mL  Height: 6' (182.9 cm) Weight: 110.3 kg (243 lb 2.7 oz) IBW/kg (Calculated) : 77.6  Temp (24hrs), Avg:99.5 F (37.5 C), Min:97.4 F (36.3 C), Max:101.4 F (38.6 C)  Recent Labs  Lab 12/11/24 0746 12/12/24 0655 12/12/24 1029 12/12/24 2052 12/13/24 0205 12/14/24 0225  WBC 8.9 11.8* 13.0*  --  15.4* 16.7*  CREATININE 1.41* 2.57*  --   --  2.62* 3.67*  LATICACIDVEN  --   --  2.6* 1.6  --   --   VANCORANDOM  --   --   --   --   --  21    Estimated Creatinine Clearance: 19.6 mL/min (A) (by C-G formula based on SCr of 3.67 mg/dL (H)).    Allergies[1]  Vanc 1/9 >> Zosyn  1/9 >>   1/8 MRSA PCR - negative 1/9 BCx - NGTD  Vermell Mccallum, PharmD 12/14/2024, 1:53 PM      [1]  Allergies Allergen Reactions   Codeine Hives and Itching   Ofev  [Nintedanib ] Diarrhea    SEVERE DIARRHEA   Pirfenidone  Diarrhea and Other (See Comments)    Esbriet  (Pirfenidone ) causes elevated LFTs. D/C on 06/14/17 and SEVERE DIARRHEA    Amoxicillin Itching

## 2024-12-14 NOTE — Progress Notes (Signed)
 eLink Physician-Brief Progress Note Patient Name: Shane [name] DOB: 03/18/1941 MRN: [id number]   Date of Service  12/14/2024  HPI/Events of Note  Twelve-lead EKG obtained secondary to rhythm abnormalities on monitor  eICU Interventions  V-paced rhythm with leadless pacemaker implanted 2021.  Electrolytes nonactionable.  No further intervention     Intervention Category Intermediate Interventions: Arrhythmia - evaluation and management  Orphia Mctigue 12/14/2024, 8:51 PM

## 2024-12-14 NOTE — Progress Notes (Signed)
 Speech Language Pathology Treatment: Dysphagia  Patient Details Name: Shane [name] MRN: [id number] DOB: 1941-11-04 Today's Date: 12/14/2024 Time: 1030-1050 SLP Time Calculation (min) (ACUTE ONLY): 20 min  Assessment / Plan / Recommendation Clinical Impression  Rec: continue NPO, allow PRN ice chips, water sips. SLP will continue to follow.   Patient seen by SLP for skilled treatment focused on dysphagia goals. His RN was in the room as well as his daughter. Daughter reported that he is more awake today but is having difficulty with breathing. Patient was awake, alert, mildly fidgety and seemed to be uncomfortable but unable to verbalize why. He was oriented to self but required yes/no questions for place. He had one instance of productive cough of thick, yellowish phlegm during session but this did not appear to correlate with PO intake. SLP assessed his toleration of small, controlled cup sips of water. Swallow initiation appeared timely, no overt s/s aspiration during or immediately after sips. Although his alertness has improved, he does not appear safe for full PO diet at this time.    HPI HPI: 84 yo male admitted 1/7 for L transtibial amputation. Transferred to ICU 1/8 for hypotension. Seen by SLP 12/2020 and 12/2023 with concern for primary esophageal dysphagia, complaining of difficulty with solids>liquids and episodes of regurgitation. Declined an MBS and although recommended, esophageal assessment was not been completed. PMH: CKD III, DM, HTN, HLD, IPF, OSA on CPAP, HFrEF with LVEF 30-35%      SLP Plan  Continue with current plan of care        Swallow Evaluation Recommendations         Recommendations  Diet recommendations: NPO;Other(comment) (PRN ice chips, water sips) Medication Administration: Via alternative means Supervision: Full supervision/cueing for compensatory strategies;Staff to assist with self feeding Compensations: Slow rate;Small sips/bites Postural  Changes and/or Swallow Maneuvers: Seated upright 90 degrees                  Oral care QID;Oral care prior to ice chip/H20   Frequent or constant Supervision/Assistance Dysphagia, unspecified (R13.10)     Continue with current plan of care    Norleen IVAR Blase, MA, CCC-SLP Speech Therapy   12/14/2024, 11:59 AM

## 2024-12-14 NOTE — Progress Notes (Signed)
 Obtained EKG due to monitor alarming R on T and pacer not capturing . Notified Elink

## 2024-12-15 ENCOUNTER — Inpatient Hospital Stay (HOSPITAL_COMMUNITY)

## 2024-12-15 LAB — RENAL FUNCTION PANEL
Albumin: 3.2 g/dL — ABNORMAL LOW (ref 3.5–5.0)
Albumin: 3.1 g/dL — ABNORMAL LOW (ref 3.5–5.0)
Anion gap: 11 (ref 5–15)
Anion gap: 10 (ref 5–15)
BUN: 69 mg/dL — ABNORMAL HIGH (ref 8–23)
BUN: 55 mg/dL — ABNORMAL HIGH (ref 8–23)
CO2: 24 mmol/L (ref 22–32)
CO2: 25 mmol/L (ref 22–32)
Calcium: 7.9 mg/dL — ABNORMAL LOW (ref 8.9–10.3)
Calcium: 8.6 mg/dL — ABNORMAL LOW (ref 8.9–10.3)
Chloride: 93 mmol/L — ABNORMAL LOW (ref 98–111)
Chloride: 97 mmol/L — ABNORMAL LOW (ref 98–111)
Creatinine, Ser: 2.9 mg/dL — ABNORMAL HIGH (ref 0.61–1.24)
Creatinine, Ser: 2.14 mg/dL — ABNORMAL HIGH (ref 0.61–1.24)
GFR, Estimated: 21 mL/min — ABNORMAL LOW
GFR, Estimated: 30 mL/min — ABNORMAL LOW
Glucose, Bld: 325 mg/dL — ABNORMAL HIGH (ref 70–99)
Glucose, Bld: 295 mg/dL — ABNORMAL HIGH (ref 70–99)
Phosphorus: 2.7 mg/dL (ref 2.5–4.6)
Phosphorus: 2 mg/dL — ABNORMAL LOW (ref 2.5–4.6)
Potassium: 5.4 mmol/L — ABNORMAL HIGH (ref 3.5–5.1)
Potassium: 5.1 mmol/L (ref 3.5–5.1)
Sodium: 128 mmol/L — ABNORMAL LOW (ref 135–145)
Sodium: 132 mmol/L — ABNORMAL LOW (ref 135–145)

## 2024-12-15 LAB — BLOOD GAS, VENOUS
Acid-Base Excess: 1.5 mmol/L (ref 0.0–2.0)
Bicarbonate: 28.5 mmol/L — ABNORMAL HIGH (ref 20.0–28.0)
O2 Saturation: 77.7 %
Patient temperature: 37
pCO2, Ven: 54 mmHg (ref 44–60)
pH, Ven: 7.33 (ref 7.25–7.43)
pO2, Ven: 44 mmHg (ref 32–45)

## 2024-12-15 LAB — MAGNESIUM: Magnesium: 2.4 mg/dL (ref 1.7–2.4)

## 2024-12-15 LAB — VANCOMYCIN, RANDOM: Vancomycin Rm: 23 ug/mL

## 2024-12-15 LAB — GLUCOSE, CAPILLARY
Glucose-Capillary: 200 mg/dL — ABNORMAL HIGH (ref 70–99)
Glucose-Capillary: 210 mg/dL — ABNORMAL HIGH (ref 70–99)
Glucose-Capillary: 255 mg/dL — ABNORMAL HIGH (ref 70–99)
Glucose-Capillary: 308 mg/dL — ABNORMAL HIGH (ref 70–99)
Glucose-Capillary: 327 mg/dL — ABNORMAL HIGH (ref 70–99)
Glucose-Capillary: 347 mg/dL — ABNORMAL HIGH (ref 70–99)

## 2024-12-15 MED ORDER — PRISMASOL BGK 2/3.5 32-2-3.5 MEQ/L EC SOLN: Status: DC

## 2024-12-15 MED ORDER — GLUCERNA 1.5 CAL PO LIQD
1000.0000 mL | ORAL | Status: DC
  Administered 2024-12-15: 1000 mL
  Filled 2024-12-15 (×2): qty 1000

## 2024-12-15 MED ORDER — INSULIN ASPART 100 UNIT/ML IJ SOLN
0.0000 [IU] | INTRAMUSCULAR | Status: DC
  Administered 2024-12-16 (×6): 3 [IU] via SUBCUTANEOUS
  Administered 2024-12-16: 5 [IU] via SUBCUTANEOUS
  Administered 2024-12-17 (×3): 3 [IU] via SUBCUTANEOUS
  Administered 2024-12-17 – 2024-12-18 (×3): 5 [IU] via SUBCUTANEOUS
  Administered 2024-12-18 – 2024-12-19 (×8): 3 [IU] via SUBCUTANEOUS
  Administered 2024-12-19: 2 [IU] via SUBCUTANEOUS
  Administered 2024-12-19: 3 [IU] via SUBCUTANEOUS
  Administered 2024-12-20 (×2): 2 [IU] via SUBCUTANEOUS
  Administered 2024-12-20: 3 [IU] via SUBCUTANEOUS
  Administered 2024-12-20: 2 [IU] via SUBCUTANEOUS
  Administered 2024-12-20 – 2024-12-21 (×4): 3 [IU] via SUBCUTANEOUS
  Administered 2024-12-21 (×2): 5 [IU] via SUBCUTANEOUS
  Administered 2024-12-21: 10 [IU] via SUBCUTANEOUS
  Administered 2024-12-21: 5 [IU] via SUBCUTANEOUS
  Administered 2024-12-22 (×2): 3 [IU] via SUBCUTANEOUS
  Administered 2024-12-22 (×3): 2 [IU] via SUBCUTANEOUS
  Administered 2024-12-22: 5 [IU] via SUBCUTANEOUS
  Administered 2024-12-23 (×2): 3 [IU] via SUBCUTANEOUS
  Administered 2024-12-23: 2 [IU] via SUBCUTANEOUS
  Administered 2024-12-24: 3 [IU] via SUBCUTANEOUS
  Administered 2024-12-24 (×3): 2 [IU] via SUBCUTANEOUS
  Administered 2024-12-25: 3 [IU] via SUBCUTANEOUS
  Administered 2024-12-25: 2 [IU] via SUBCUTANEOUS
  Administered 2024-12-25: 3 [IU] via SUBCUTANEOUS
  Filled 2024-12-15: qty 3
  Filled 2024-12-15: qty 5
  Filled 2024-12-15: qty 11
  Filled 2024-12-15: qty 5
  Filled 2024-12-15: qty 3
  Filled 2024-12-15: qty 5
  Filled 2024-12-15: qty 2
  Filled 2024-12-15: qty 3
  Filled 2024-12-15: qty 5
  Filled 2024-12-15: qty 3
  Filled 2024-12-15: qty 8
  Filled 2024-12-15: qty 2
  Filled 2024-12-15 (×3): qty 3
  Filled 2024-12-15: qty 8
  Filled 2024-12-15 (×2): qty 3
  Filled 2024-12-15: qty 2
  Filled 2024-12-15 (×2): qty 3
  Filled 2024-12-15: qty 5
  Filled 2024-12-15 (×2): qty 3
  Filled 2024-12-15: qty 2
  Filled 2024-12-15: qty 3
  Filled 2024-12-15 (×2): qty 2
  Filled 2024-12-15: qty 5
  Filled 2024-12-15 (×3): qty 3
  Filled 2024-12-15: qty 2
  Filled 2024-12-15 (×5): qty 3
  Filled 2024-12-15 (×2): qty 5
  Filled 2024-12-15: qty 2
  Filled 2024-12-15 (×2): qty 3

## 2024-12-15 MED ORDER — POLYETHYLENE GLYCOL 3350 17 G PO PACK: 17.0000 g | PACK | Freq: Every day | ORAL | Status: DC

## 2024-12-15 MED ORDER — INSULIN ASPART 100 UNIT/ML IJ SOLN
5.0000 [IU] | INTRAMUSCULAR | Status: DC
  Administered 2024-12-15 – 2024-12-16 (×6): 5 [IU] via SUBCUTANEOUS
  Filled 2024-12-15 (×4): qty 5

## 2024-12-15 MED ORDER — INSULIN ASPART 100 UNIT/ML IJ SOLN
3.0000 [IU] | INTRAMUSCULAR | Status: DC
  Filled 2024-12-15: qty 9

## 2024-12-15 MED ORDER — K PHOS MONO-SOD PHOS DI & MONO 155-852-130 MG PO TABS
500.0000 mg | ORAL_TABLET | ORAL | Status: AC
  Administered 2024-12-15 – 2024-12-16 (×4): 500 mg
  Filled 2024-12-15 (×4): qty 2

## 2024-12-15 MED ORDER — DEXTROSE 10 % IV SOLN: INTRAVENOUS | Status: DC

## 2024-12-15 MED ORDER — INSULIN ASPART 100 UNIT/ML IJ SOLN
0.0000 [IU] | Freq: Three times a day (TID) | INTRAMUSCULAR | Status: DC
  Administered 2024-12-15: 11 [IU] via SUBCUTANEOUS
  Administered 2024-12-15: 15 [IU] via SUBCUTANEOUS
  Administered 2024-12-15: 7 [IU] via SUBCUTANEOUS
  Filled 2024-12-15: qty 5

## 2024-12-15 MED ORDER — LINEZOLID 600 MG/300ML IV SOLN
600.0000 mg | Freq: Two times a day (BID) | INTRAVENOUS | Status: DC
  Administered 2024-12-15 – 2024-12-17 (×5): 600 mg via INTRAVENOUS
  Filled 2024-12-15 (×5): qty 300

## 2024-12-15 MED ORDER — INSULIN ASPART 100 UNIT/ML IJ SOLN
5.0000 [IU] | INTRAMUSCULAR | Status: DC
  Administered 2024-12-15: 5 [IU] via SUBCUTANEOUS
  Filled 2024-12-15: qty 5

## 2024-12-15 MED ORDER — INSULIN GLARGINE 100 UNIT/ML ~~LOC~~ SOLN
20.0000 [IU] | Freq: Two times a day (BID) | SUBCUTANEOUS | Status: DC
  Administered 2024-12-15 – 2024-12-16 (×3): 20 [IU] via SUBCUTANEOUS
  Filled 2024-12-15 (×5): qty 0.2

## 2024-12-15 NOTE — Progress Notes (Signed)
 Pharmacy Electrolyte Replacement  Recent Labs:  Recent Labs    12/15/24 0205  K 5.4*  MG 2.4  PHOS 2.7  CREATININE 2.90*    Low Critical Values (K </= 2.5, Phos </= 1, Mg </= 1) Present: None   Plan: Phos neutral 500 mg x 4 doses per CRRT Phos Pharmacist Protocol.   Shelbey Spindler, PharmD

## 2024-12-15 NOTE — Plan of Care (Signed)
" °  Problem: Education: Goal: Knowledge of the prescribed therapeutic regimen will improve Outcome: Progressing   Problem: Bowel/Gastric: Goal: Gastrointestinal status for postoperative course will improve Outcome: Progressing   Problem: Cardiac: Goal: Ability to maintain an adequate cardiac output Outcome: Progressing Goal: Will show no evidence of cardiac arrhythmias Outcome: Progressing   "

## 2024-12-15 NOTE — Progress Notes (Signed)
 eLink Physician-Brief Progress Note Patient Name: Shane [name] DOB: Jun 08, 1941 MRN: [id number]   Date of Service  12/15/2024  HPI/Events of Note  Diabetic with refractory hyperglycemia, remains n.p.o.  eICU Interventions  Switch from ACHS to every 4 hour insulin    0525 -multiple liquid stools, Flexi-Seal  Intervention Category Intermediate Interventions: Hyperglycemia - evaluation and treatment  Bilaal Leib 12/15/2024, 9:24 PM

## 2024-12-15 NOTE — Plan of Care (Signed)
" °  Problem: Bowel/Gastric: Goal: Gastrointestinal status for postoperative course will improve Outcome: Progressing   Problem: Cardiac: Goal: Ability to maintain an adequate cardiac output Outcome: Progressing Goal: Will show no evidence of cardiac arrhythmias Outcome: Progressing   Problem: Neurological: Goal: Will regain or maintain usual level of consciousness Outcome: Progressing   Problem: Clinical Measurements: Goal: Ability to maintain clinical measurements within normal limits Outcome: Progressing   Problem: Respiratory: Goal: Will regain and/or maintain adequate ventilation Outcome: Progressing Goal: Respiratory status will improve Outcome: Progressing   Problem: Skin Integrity: Goal: Demonstrates signs of wound healing without infection Outcome: Progressing   Problem: Urinary Elimination: Goal: Will remain free from infection Outcome: Progressing   Problem: Education: Goal: Knowledge of General Education information will improve Description: Including pain rating scale, medication(s)/side effects and non-pharmacologic comfort measures Outcome: Progressing   Problem: Health Behavior/Discharge Planning: Goal: Ability to manage health-related needs will improve Outcome: Progressing   Problem: Clinical Measurements: Goal: Ability to maintain clinical measurements within normal limits will improve Outcome: Progressing Goal: Will remain free from infection Outcome: Progressing Goal: Diagnostic test results will improve Outcome: Progressing Goal: Respiratory complications will improve Outcome: Progressing Goal: Cardiovascular complication will be avoided Outcome: Progressing   Problem: Nutrition: Goal: Adequate nutrition will be maintained Outcome: Progressing   Problem: Pain Managment: Goal: General experience of comfort will improve and/or be controlled Outcome: Progressing   Problem: Safety: Goal: Ability to remain free from injury will  improve Outcome: Progressing   Problem: Skin Integrity: Goal: Risk for impaired skin integrity will decrease Outcome: Progressing   Problem: Metabolic: Goal: Ability to maintain appropriate glucose levels will improve Outcome: Progressing   Problem: Nutritional: Goal: Maintenance of adequate nutrition will improve Outcome: Progressing Goal: Progress toward achieving an optimal weight will improve Outcome: Progressing   "

## 2024-12-15 NOTE — Progress Notes (Addendum)
 [facility] Progress Note  Subjective:  I/O yest 2.7 L in and 3.6 L out = net neg 800 cc  Levo up at 10-12 micrograms/min, vaso stable 0.03  Presentation summary: 84 y.o. year-old w/ PMH of R BKA, anemia, CKD 3, DM, HTN, HL, hx idiopathic pulm fibrosis, OSA who was admitted after L BKA surgery done for gangrene of L foot by Dr Harden on 1/07.  Post-op was hypotensive and medical team consulted. There was 3g Hb drop and decreasing [name]. Got IVFs but bp's didn't respond and CCM consulted and pt moved to ICU 1/08. Levophed  started on 1/08 and vaso started on 1/10. Creat was up to 2.5 (b/l 1.5) yesterday. Hx of EF 30%, not vol overloaded at that time per pmd. CCM saw pt and felt he was not toxic appearing, mild ^LA but couldn't give more IVF's d/t cardiac status and pt having SOB already after some IVFs. Echo ordered and IV lasix  tried but [name] dropped to < 200 cc 10/09. Creat cont'd to ^ up to 3.6 today 1/10. Still c/o SOB. We are asked to see for CRRT.  Pt seen in ICU. Pt c/o SOB, only main c/o. Not good historian and looks visibly uncomfortable.   Vitals:   12/15/24 0800 12/15/24 0817 12/15/24 0900 12/15/24 0917  BP: (!) 155/50 (!) 116/49 (!) 120/51 (!) 136/59  Pulse: 69 61 61 64  Resp: 20 17 18 16   Temp: 99 F (37.2 C) 99.1 F (37.3 C) 99.3 F (37.4 C) 99.1 F (37.3 C)  TempSrc:      SpO2: 96% 100% 96% 99%  Weight:      Height:        Exam: Gen elderly male, lethargic 2L [place] O2 Sclera anicteric, throat clear  Chest clear bilat to bases RRR no MRG Abd soft ntnd no mass or ascites +bs, obese Ext bilat LE bka w/ 1-2+ edema L > R  Bilat arm edema 2+ yesterday, improving today 1+ Neuro is lethargic      Home bp meds: Lasix  Lisinopril  Home O2       Date                             Creat               eGFR (ml/min) 2008- 2012                  1.01- 2.80 2017- 2019                  1.30- 1.79 2021                            1.01- 1.45 2022                             1.02- 3.31                     2024                            1.57- 1.87 Jan -feb 2025              5.29 >> 1.28    10 >> 56 ml/min           May 2025  2.66 >> 1.60 Aug 2025                     1.70 >> 1.12    40 >> > 60 ml/min 1/07                             1.41 1/08                             2.57 1/09                             2.62 1/10                             3.67   Inpatient meds: Lisinopril  on 1/07 x 1 Midodrine  10 tid started 1/09 IV lasix  drip 1/10 at 5 mg/ hr, dc'd  LR bolus 2 L on 1/08 IV vanc started 1/09 No CT contrast    UA 1/09 - yellow, prot 100, 0-5 rbc, 6-10 wbc Renal US  pend CXR 1/10 -> IMPRESSION: Chronic pulmonary fibrosis. Stable ventilation       Assessment/ Plan:   # AKI on CKD 3a - b/l creatinine 1.3- 1.7, eGFR 40- 56 ml/min, from jan- aug 2025.  - AKI w/ creat 3.6 due to post -op hypotension/ shock w/ vol overload - did not respond to IV lasix  - CRRT started 1/10 - Cont CRRT   # Volume  - CXR just shows severe IPF  - hx of low EF 30%, SOB improving now - will ^UF w/ CRRT to 75- 125 cc/hr   # Shock/ hypotension - remains on 2 pressors, levo at 10-12 micrograms/min + vaso 0.03   # hyponatremia - due to hypervolemia - pulling net neg w/ CRRT - Na up to 128 today - cont vol removal w/ CRRT   # sp L BKA  - on 1/07       Myer Fret MD  [facility] 12/15/2024, 10:03 AM  Recent Labs  Lab 12/13/24 0205 12/13/24 1943 12/14/24 0225 12/14/24 1603 12/15/24 0205  HGB 9.7*  --  8.7*  --   --   ALBUMIN   --   --   --  3.1* 3.2*  CALCIUM  8.4*  --  8.2* 8.0* 7.9*  PHOS  --    < > 4.5 4.2 2.7  CREATININE 2.62*  --  3.67* 4.13* 2.90*  K 4.7  --  4.9 5.3* 5.4*   < > = values in this interval not displayed.   No results for input(s): IRON , TIBC, FERRITIN in the last 168 hours. Inpatient medications:  acidophilus  1 capsule Oral Daily   vitamin C   1,000 mg Per Tube Daily   atorvastatin   80 mg Per Tube Daily    Chlorhexidine  Gluconate Cloth  6 each Topical Q0600   docusate  100 mg Per Tube Daily   escitalopram   10 mg Per Tube Daily   feeding supplement (PROSource TF20)  60 mL Per Tube BID   ferrous sulfate   300 mg Per Tube Q breakfast   gabapentin   100 mg Per Tube Q8H   heparin  injection (subcutaneous)  5,000 Units Subcutaneous Q8H   insulin  aspart  3-9 Units Subcutaneous Q4H   insulin  aspart  5 Units Subcutaneous Q4H   insulin  glargine  15  Units Subcutaneous BID   midodrine   10 mg Per Tube TID WC   multivitamin with minerals  1 tablet Per Tube q AM   nutrition supplement (JUVEN)  1 packet Per Tube BID BM   pantoprazole   40 mg Oral BID   polyethylene glycol  17 g Per Tube BID   sodium chloride  flush  3 mL Intravenous Q12H   thiamine   100 mg Per Tube Daily   vancomycin  variable dose per unstable renal function (pharmacist dosing)   Does not apply See admin instructions    dextrose      feeding supplement (VITAL 1.5 CAL) 45 mL/hr at 12/15/24 0900   norepinephrine  (LEVOPHED ) Adult infusion 11 mcg/min (12/15/24 0900)   piperacillin -tazobactam (ZOSYN )  IV Stopped (12/15/24 0610)   PrismaSol  BGK 2/3.5 1,500 mL/hr at 12/15/24 0656   prismasol  BGK 4/2.5 400 mL/hr at 12/15/24 0359   prismasol  BGK 4/2.5 400 mL/hr at 12/15/24 0359   vasopressin  0.03 Units/min (12/15/24 0900)   acetaminophen , albuterol , dextromethorphan , EPINEPHrine , fentaNYL  (SUBLIMAZE ) injection, heparin , heparin , hydrOXYzine , methocarbamol , mupirocin  ointment, olopatadine , ondansetron , oxyCODONE , phenol, potassium chloride , sodium chloride  flush, traZODone 

## 2024-12-15 NOTE — Progress Notes (Addendum)
 "  NAME:  Shane [name], MRN:  [id number], DOB:  07/15/1941, LOS: 4 ADMISSION DATE:  12/11/2024, CONSULTATION DATE:  12/12/24 REFERRING MD:  Dr. Harden, CHIEF COMPLAINT:  hypotension   History of Present Illness:  84 year old male with past medical history as below, which is significant for chronic kidney disease stage III, diabetes, hypertension, hyperlipidemia, IPF, OSA on CPAP and at bedtime 2L [place], and HFrEF with LVEF 30 to 35%.  He presented to [facility] 1/7 after failing conservative outpatient therapy for ischemic lower extremity ulcers and left foot with gangrene.  He underwent transtibial amputation and was subsequently sent to the medical floor for recovery.  In the early a.m. hours 1/8 he became hypotensive which did improve temporarily with crystalloid bolus, however, he again became hypotensive with systolic blood pressures in the 70s and 80s.  PCCM was asked to evaluate on this basis.   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  Significant Hospital Events: Including procedures, antibiotic start and stop dates in addition to other pertinent events   1/7 admit for L transtibial amputation 1/7 1/8 hypotensive, tx to ICU on pressors, albumin , lactic  resolved  Interim History / Subjective:  On levo 10, vaso, on CRRT Glucose in the 300s,-on glargine 15 twice daily, sliding scale, will add tube feeds insulin  5 q6     Objective    Blood pressure (!) 119/52, pulse 63, temperature 99 F (37.2 C), temperature source Bladder, resp. rate 16, height 6' (1.829 m), weight 108.2 kg, SpO2 99%.        Intake/Output Summary (Last 24 hours) at 12/15/2024 0732 Last data filed at 12/15/2024 0700 Gross per 24 hour  Intake 2650.2 ml  Output 3643 ml  Net -992.8 ml   Filed Weights   12/14/24 0500 12/15/24 0414 12/15/24 0415  Weight: 110.3 kg 108.2 kg 108.2 kg    Examination: General: Ill-appearing male, lying in bed HEENT: NCAT Chest: Symmetrical chest movement, fine crackles CVS: S1-S2 normal, no murmur GI: Soft nontender nondistended Extremities: Warm and dry, bilateral BKA's,-left BKA wound VAC dressing in place,'s serosanguineous fluid. Neuro: Still lethargic, AOX1 GU: Deferred     Resolved problem list    Assessment and Plan   84 year old male with history of CKD 3, DM, HFrEF 30-35%, prior right BKA, who presented with left foot gangrene s/p left transtibial amputation on 1/7, then presented with hypotension postprocedure likely due to septic shock. No bleeding, Hb stable, lactic acid 2.6 Patient is on vasopressors, and started on CRRT on 1/10 for oliguria refractory to Lasix  gtt.    Acute metabolic encephalopathy Circulatory shock likely due to septic shock Septic shock-likely due to left foot gangrene s/p left transtibial amputation on 1/7 Lactic acidosis HFrEF EF 30-35% HTN A-fib on home Eliquis  DM2 AKI on CKD 3 OSA on CPAP nightly, 2L [place] Severe PAD s/p bilateral BKA's   Plan - Wean off vasopressors as tolerated to maintain MAP> 65 - Continue CRRT, aiming  for net negative - Nephrology team on board - Glucose management, added tube feed insulin  5 unit q6hrs to glargine 15 BID, + sliding scale - Continue tube  feeds - Will continue with subcu heparin , will reengage Ortho for resumption of Eliquis  - Multimodal pain management - Night CPAP - Monitor and replace electrolyte as needed      Labs   CBC: Recent Labs  Lab 12/11/24 0746 12/12/24 0655 12/12/24 1029 12/13/24 0205 12/14/24 0225  WBC 8.9 11.8* 13.0* 15.4* 16.7*  NEUTROABS 6.1  --   --   --   --   HGB 12.0* 9.3* 9.0* 9.7* 8.7*  HCT 36.2* 27.6* 27.4* 28.7* 26.6*  MCV 90.7 89.3 91.9 90.0 91.1  PLT 194 166 151 180 153    Basic Metabolic Panel: Recent Labs  Lab 12/12/24 0655 12/13/24 0205 12/13/24 1943 12/14/24 0225 12/14/24 1603 12/15/24 0205  NA 131* 128*  --  125* 125* 128*  K 4.7 4.7  --  4.9 5.3* 5.4*  CL 96* 90*  --  89* 88* 93*  CO2 27 26  --  22 24 24   GLUCOSE 138* 243*  --  221* 341* 325*  BUN 60* 67*  --  82* 93* 69*  CREATININE 2.57* 2.62*  --  3.67* 4.13* 2.90*  CALCIUM  8.3* 8.4*  --  8.2* 8.0* 7.9*  MG  --  1.5*  --  1.9  --  2.4  PHOS  --   --  4.2 4.5 4.2 2.7   GFR: Estimated Creatinine Clearance: 24.5 mL/min (A) (by C-G formula based on SCr of 2.9 mg/dL (H)). Recent Labs  Lab 12/12/24 0655 12/12/24 1029 12/12/24 2052 12/13/24 0205 12/14/24 0225  WBC 11.8* 13.0*  --  15.4* 16.7*  LATICACIDVEN  --  2.6* 1.6  --   --     Liver Function Tests: Recent Labs  Lab 12/11/24 0746 12/14/24 1603 12/15/24 0205  AST 20  --   --   ALT 13  --   --   ALKPHOS 124  --   --   BILITOT 0.5  --   --   PROT 7.1  --   --   ALBUMIN  3.6 3.1* 3.2*   No results for input(s): LIPASE, AMYLASE in the last 168 hours. No results for input(s): AMMONIA in the last 168 hours.  ABG    Component Value Date/Time   PHART 7.362 12/27/2016 0334   PCO2ART 45.4 12/27/2016 0334   PO2ART 115 (H) 12/27/2016 0334   HCO3 24.2 12/14/2024 0922   TCO2 24 07/24/2024 0019   ACIDBASEDEF 2.2 (H) 12/14/2024 0922   O2SAT 93 12/14/2024 0922     Coagulation Profile: No results for input(s): INR, PROTIME in the last 168  hours.  Cardiac Enzymes: No results for input(s): CKTOTAL, CKMB, CKMBINDEX, TROPONINI in the last 168 hours.  HbA1C: Hgb A1c MFr Bld  Date/Time Value Ref Range Status  12/11/2024 02:39 PM 7.6 (H) 4.8 - 5.6 % Final    Comment:    (NOTE) Diagnosis of Diabetes The following HbA1c ranges recommended by the American Diabetes Association (ADA) may be used as an aid in the diagnosis of diabetes mellitus.  Hemoglobin             Suggested A1C NGSP%              Diagnosis  <5.7                   Non Diabetic  5.7-6.4  Pre-Diabetic  >6.4                   Diabetic  <7.0                   Glycemic control for                       adults with diabetes.    04/09/2024 11:38 PM 8.7 (H) 4.8 - 5.6 % Final    Comment:    (NOTE) Pre diabetes:          5.7%-6.4%  Diabetes:              >6.4%  Glycemic control for   <7.0% adults with diabetes     CBG: Recent Labs  Lab 12/14/24 1208 12/14/24 1528 12/14/24 1958 12/14/24 2343 12/15/24 0408  GLUCAP 307* 349* 303* 324* 308*   Allergies Allergies[1]   Home Medications  Prior to Admission medications  Medication Sig Start Date End Date Taking? Authorizing Provider  acetaminophen  (TYLENOL ) 500 MG tablet Take 1,000 mg by mouth every 8 (eight) hours as needed for mild pain (pain score 1-3) or moderate pain (pain score 4-6).   Yes [provider]  albuterol  (VENTOLIN  HFA) 108 (90 Base) MCG/ACT inhaler Inhale 2 puffs into the lungs every 4 (four) hours as needed for wheezing or shortness of breath. 04/12/21  Yes Angiulli, Toribio PARAS, PA-C  apixaban  (ELIQUIS ) 5 MG TABS tablet Take 5 mg by mouth 2 (two) times daily.   Yes [provider]  Ascorbic Acid  (VITAMIN C ) 1000 MG tablet Take 1,000 mg by mouth daily.   Yes [provider]  atorvastatin  (LIPITOR ) 80 MG tablet TAKE 1 TABLET BY MOUTH EVERY DAY 07/25/22  Yes Pietro Redell RAMAN, MD  diphenhydrAMINE  (BENADRYL ) 50 MG tablet Take 50 mg by mouth  at bedtime as needed for itching.   Yes [provider]  docusate sodium  (COLACE) 100 MG capsule Take 1 capsule (100 mg total) by mouth 2 (two) times daily. Patient taking differently: Take 100 mg by mouth at bedtime. 12/29/23  Yes Tobie Yetta HERO, MD  escitalopram  (LEXAPRO ) 10 MG tablet Take 1 tablet (10 mg total) by mouth daily. 04/12/21  Yes Angiulli, Toribio PARAS, PA-C  furosemide  (LASIX ) 20 MG tablet Take 20 mg by mouth daily with lunch.   Yes [provider]  furosemide  (LASIX ) 40 MG tablet Take 1 tablet (40 mg total) by mouth 2 (two) times daily. 04/14/24 04/14/25 Yes Fairy Frames, MD  gabapentin  (NEURONTIN ) 100 MG capsule Take 1 capsule (100 mg total) by mouth 3 (three) times daily. Patient taking differently: Take 100 mg by mouth 3 (three) times daily as needed (Pain). 12/29/23  Yes Tobie Yetta HERO, MD  HUMALOG  KWIKPEN 100 UNIT/ML KwikPen Inject 10-15 Units into the skin 3 (three) times daily. Can take 15-18 Units with meals while on Prednisone  for 3days Patient taking differently: Inject 15-20 Units into the skin 3 (three) times daily with meals. Sliding scale 04/14/24  Yes Fairy Frames, MD  HYDROcodone  bit-homatropine [facility]) 5-1.5 MG/5ML syrup Take 5 mLs by mouth every 6 (six) hours as needed for cough. 12/29/23  Yes Tobie Yetta HERO, MD  HYDROcodone -acetaminophen  (NORCO/VICODIN) 5-325 MG tablet Take 1 tablet by mouth at bedtime as needed for moderate pain (pain score 4-6) or severe pain (pain score 7-10).   Yes [provider]  hydrOXYzine  (ATARAX ) 25 MG tablet Take 25 mg by mouth at bedtime. 03/06/24  Yes [provider]  ibuprofen (ADVIL) 200 MG tablet Take 400 mg by mouth every 6 (six) hours as needed for moderate pain (pain score 4-6) or mild pain (pain score 1-3).   Yes [provider]  lisinopril  (ZESTRIL ) 10 MG tablet Take 10 mg by mouth daily. 05/08/24  Yes [provider]  LORazepam  (ATIVAN ) 1 MG tablet Take 1 tablet (1 mg total) by  mouth at bedtime as needed for sleep. 12/29/23  Yes Tobie Yetta HERO, MD  Magnesium  200 MG TABS Take 200 mg by mouth at bedtime. 03/06/24  Yes [provider]  methocarbamol  (ROBAXIN ) 500 MG tablet Take 1 tablet (500 mg total) by mouth every 8 (eight) hours as needed for muscle spasms. 12/29/23  Yes Tobie Yetta HERO, MD  Multiple Vitamin (MULTIVITAMIN WITH MINERALS) TABS tablet Take 1 tablet by mouth in the morning.   Yes [provider]  Multiple Vitamins-Minerals (ICAPS AREDS FORMULA PO) Take 2 tablets by mouth daily.   Yes [provider]  Nutritional Supplements (FEEDING SUPPLEMENT, NEPRO CARB STEADY,) LIQD Take 237 mLs by mouth 2 (two) times daily between meals. Patient taking differently: Take 237 mLs by mouth 2 (two) times daily between meals. Premier 12/29/23  Yes Tobie Yetta HERO, MD  Olopatadine  HCl (PATADAY  OP) Place 2 drops into both eyes daily as needed (allergies).   Yes [provider]  OXYGEN Inhale 2 L into the lungs See admin instructions. Inhale 3L of Oxygen everynight at bedside. May uses throughout the day as needed.   Yes [provider]  pantoprazole  (PROTONIX ) 40 MG tablet Take 1 tablet (40 mg total) by mouth 2 (two) times daily. 04/12/21  Yes Angiulli, Daniel J, PA-C  polyethylene glycol powder (GLYCOLAX /MIRALAX ) 17 GM/SCOOP powder Take 17 g by mouth daily as needed for mild constipation or moderate constipation. 11/15/20  Yes [provider]  Probiotic Product (ALIGN) 10 MG CAPS Take 10 mg by mouth daily.   Yes [provider]  traZODone  (DESYREL ) 50 MG tablet Take 75 mg by mouth at bedtime. 05/11/23  Yes [provider]  TRESIBA  FLEXTOUCH 200 UNIT/ML FlexTouch Pen Inject 20 Units into the skin in the morning. 12/04/24  Yes [provider]  zinc  sulfate 220 (50 Zn) MG capsule Take 1 capsule (220 mg total) by mouth daily. 04/12/21  Yes Angiulli, Toribio PARAS, PA-C  albuterol  (PROVENTIL ) (2.5 MG/3ML) 0.083%  nebulizer solution Take 2.5 mg by nebulization 2 (two) times daily as needed for wheezing or shortness of breath.    [provider]  benzonatate  (TESSALON ) 100 MG capsule Take 1 capsule (100 mg total) by mouth 3 (three) times daily. Patient taking differently: Take 100 mg by mouth 3 (three) times daily as needed for cough. 12/29/23   Tobie Yetta HERO, MD  dextromethorphan  (DELSYM ) 30 MG/5ML liquid Take 2.5 mLs (15 mg total) by mouth 2 (two) times daily. Patient taking differently: Take 15 mg by mouth daily as needed for cough. 12/29/23   Tobie Yetta HERO, MD  EPINEPHrine  0.3 mg/0.3 mL IJ SOAJ injection Inject 0.3 mg into the muscle as needed for anaphylaxis. 07/26/24   Ghimire, Donalda HERO, MD  mupirocin  ointment (BACTROBAN ) 2 % Apply 1 Application topically daily as needed (Rash). 06/06/23   [provider]  ondansetron  (ZOFRAN ) 4 MG tablet Take 4 mg by mouth as needed for nausea or vomiting. 06/15/23   [provider]  oxyCODONE -acetaminophen  (PERCOCET) 7.5-325 MG tablet Take 1 tablet by mouth every 4 (four) hours as  needed for moderate pain (pain score 4-6) or severe pain (pain score 7-10). 11/14/24   [provider]  triamcinolone cream (KENALOG) 0.1 % Apply 1 Application topically daily as needed (Rash).    [provider]          CRITICAL CARE  Lenny Drought, MD  [facility] Prefer epic messenger for cross cover needs   My critical care time: 35 minutes  Critical care time was exclusive of separately billable procedures and treating other patients.  Critical care was necessary to treat or prevent imminent or life-threatening deterioration.  Critical care was time spent personally by me on the following activities: development of treatment plan with patient and/or surrogate as well as nursing, discussions with consultants, evaluation of patient's response to treatment, examination of patient, obtaining history from patient  or surrogate, ordering and performing treatments and interventions, ordering and review of laboratory studies, ordering and review of radiographic studies, pulse oximetry, re-evaluation of patient's condition and participation in multidisciplinary rounds.                [1]  Allergies Allergen Reactions   Codeine Hives and Itching   Ofev  [Nintedanib ] Diarrhea    SEVERE DIARRHEA   Pirfenidone  Diarrhea and Other (See Comments)    Esbriet  (Pirfenidone ) causes elevated LFTs. D/C on 06/14/17 and SEVERE DIARRHEA    Amoxicillin Itching   "

## 2024-12-16 DIAGNOSIS — I4891 Unspecified atrial fibrillation: Secondary | ICD-10-CM

## 2024-12-16 DIAGNOSIS — E872 Acidosis, unspecified: Secondary | ICD-10-CM

## 2024-12-16 DIAGNOSIS — R6521 Severe sepsis with septic shock: Secondary | ICD-10-CM

## 2024-12-16 DIAGNOSIS — N1832 Chronic kidney disease, stage 3b: Secondary | ICD-10-CM

## 2024-12-16 DIAGNOSIS — I5020 Unspecified systolic (congestive) heart failure: Secondary | ICD-10-CM

## 2024-12-16 DIAGNOSIS — I130 Hypertensive heart and chronic kidney disease with heart failure and stage 1 through stage 4 chronic kidney disease, or unspecified chronic kidney disease: Secondary | ICD-10-CM

## 2024-12-16 DIAGNOSIS — E1151 Type 2 diabetes mellitus with diabetic peripheral angiopathy without gangrene: Secondary | ICD-10-CM

## 2024-12-16 DIAGNOSIS — E1165 Type 2 diabetes mellitus with hyperglycemia: Secondary | ICD-10-CM

## 2024-12-16 DIAGNOSIS — E1122 Type 2 diabetes mellitus with diabetic chronic kidney disease: Secondary | ICD-10-CM

## 2024-12-16 DIAGNOSIS — G4733 Obstructive sleep apnea (adult) (pediatric): Secondary | ICD-10-CM

## 2024-12-16 DIAGNOSIS — A419 Sepsis, unspecified organism: Principal | ICD-10-CM

## 2024-12-16 LAB — GLUCOSE, CAPILLARY
Glucose-Capillary: 163 mg/dL — ABNORMAL HIGH (ref 70–99)
Glucose-Capillary: 172 mg/dL — ABNORMAL HIGH (ref 70–99)
Glucose-Capillary: 176 mg/dL — ABNORMAL HIGH (ref 70–99)
Glucose-Capillary: 194 mg/dL — ABNORMAL HIGH (ref 70–99)
Glucose-Capillary: 198 mg/dL — ABNORMAL HIGH (ref 70–99)
Glucose-Capillary: 208 mg/dL — ABNORMAL HIGH (ref 70–99)

## 2024-12-16 LAB — RENAL FUNCTION PANEL
Albumin: 3.2 g/dL — ABNORMAL LOW (ref 3.5–5.0)
Albumin: 3.1 g/dL — ABNORMAL LOW (ref 3.5–5.0)
Anion gap: 10 (ref 5–15)
Anion gap: 10 (ref 5–15)
BUN: 50 mg/dL — ABNORMAL HIGH (ref 8–23)
BUN: 51 mg/dL — ABNORMAL HIGH (ref 8–23)
CO2: 26 mmol/L (ref 22–32)
CO2: 27 mmol/L (ref 22–32)
Calcium: 8.9 mg/dL (ref 8.9–10.3)
Calcium: 8.9 mg/dL (ref 8.9–10.3)
Chloride: 97 mmol/L — ABNORMAL LOW (ref 98–111)
Chloride: 100 mmol/L (ref 98–111)
Creatinine, Ser: 1.9 mg/dL — ABNORMAL HIGH (ref 0.61–1.24)
Creatinine, Ser: 1.7 mg/dL — ABNORMAL HIGH (ref 0.61–1.24)
GFR, Estimated: 35 mL/min — ABNORMAL LOW
GFR, Estimated: 40 mL/min — ABNORMAL LOW
Glucose, Bld: 191 mg/dL — ABNORMAL HIGH (ref 70–99)
Glucose, Bld: 203 mg/dL — ABNORMAL HIGH (ref 70–99)
Phosphorus: 3.3 mg/dL (ref 2.5–4.6)
Phosphorus: 2.2 mg/dL — ABNORMAL LOW (ref 2.5–4.6)
Potassium: 4.6 mmol/L (ref 3.5–5.1)
Potassium: 4.6 mmol/L (ref 3.5–5.1)
Sodium: 134 mmol/L — ABNORMAL LOW (ref 135–145)
Sodium: 136 mmol/L (ref 135–145)

## 2024-12-16 LAB — CBC
HCT: 25.2 % — ABNORMAL LOW (ref 39.0–52.0)
Hemoglobin: 8.4 g/dL — ABNORMAL LOW (ref 13.0–17.0)
MCH: 30.8 pg (ref 26.0–34.0)
MCHC: 33.3 g/dL (ref 30.0–36.0)
MCV: 92.3 fL (ref 80.0–100.0)
Platelets: 164 K/uL (ref 150–400)
RBC: 2.73 MIL/uL — ABNORMAL LOW (ref 4.22–5.81)
RDW: 15.1 % (ref 11.5–15.5)
WBC: 11.5 K/uL — ABNORMAL HIGH (ref 4.0–10.5)
nRBC: 0 % (ref 0.0–0.2)

## 2024-12-16 LAB — MAGNESIUM: Magnesium: 2.3 mg/dL (ref 1.7–2.4)

## 2024-12-16 MED ORDER — KATE FARMS STANDARD 1.4 EN LIQD
1000.0000 mL | ENTERAL | Status: DC
  Administered 2024-12-16 (×2): 1000 mL
  Filled 2024-12-16: qty 1000

## 2024-12-16 MED ORDER — DOCUSATE SODIUM 50 MG/5ML PO LIQD: 100.0000 mg | Freq: Every day | ORAL | Status: DC | PRN

## 2024-12-16 MED ORDER — KATE FARMS STANDARD 1.4 EN LIQD
1000.0000 mL | ENTERAL | Status: DC
  Administered 2024-12-16 – 2024-12-24 (×8): 1000 mL
  Filled 2024-12-16 (×13): qty 1000

## 2024-12-16 MED ORDER — PRISMASOL BGK 4/2.5 32-4-2.5 MEQ/L EC SOLN: Status: DC

## 2024-12-16 MED ORDER — PANTOPRAZOLE SODIUM 40 MG IV SOLR
40.0000 mg | Freq: Two times a day (BID) | INTRAVENOUS | Status: DC
  Administered 2024-12-16 – 2024-12-24 (×17): 40 mg via INTRAVENOUS
  Filled 2024-12-16 (×19): qty 10

## 2024-12-16 MED ORDER — INSULIN ASPART 100 UNIT/ML IJ SOLN
8.0000 [IU] | INTRAMUSCULAR | Status: DC
  Administered 2024-12-16 – 2024-12-17 (×6): 8 [IU] via SUBCUTANEOUS
  Filled 2024-12-16 (×4): qty 8

## 2024-12-16 MED ORDER — APIXABAN 2.5 MG PO TABS
2.5000 mg | ORAL_TABLET | Freq: Two times a day (BID) | ORAL | Status: DC
  Administered 2024-12-16: 2.5 mg via ORAL
  Filled 2024-12-16: qty 1

## 2024-12-16 MED ORDER — POLYETHYLENE GLYCOL 3350 17 G PO PACK: 17.0000 g | PACK | Freq: Every day | ORAL | Status: DC | PRN

## 2024-12-16 MED ORDER — APIXABAN 2.5 MG PO TABS
2.5000 mg | ORAL_TABLET | Freq: Two times a day (BID) | ORAL | Status: DC
  Administered 2024-12-16 – 2024-12-25 (×18): 2.5 mg
  Filled 2024-12-16 (×18): qty 1

## 2024-12-16 MED ORDER — SODIUM PHOSPHATES 45 MMOLE/15ML IV SOLN
15.0000 mmol | Freq: Once | INTRAVENOUS | Status: AC
  Administered 2024-12-16: 15 mmol via INTRAVENOUS
  Filled 2024-12-16: qty 5

## 2024-12-16 MED ORDER — PROSOURCE TF20 ENFIT COMPATIBL EN LIQD
60.0000 mL | Freq: Three times a day (TID) | ENTERAL | Status: DC
  Administered 2024-12-16 – 2024-12-20 (×12): 60 mL
  Filled 2024-12-16 (×12): qty 60

## 2024-12-16 NOTE — Procedures (Signed)
 Admit: 12/11/2024 LOS: 5  89M dialysis dependent AKI on CKD 3,  Current CRRT Prescription: Start Date: 12/14/24 Catheter: R IJ HD cath BFR: 250 Pre Blood Pump: 400 4K DFR: 1500 2K Replacement Rate: 400 4K Goal UF: 125 mL net negative per hour Anticoagulation: None Clotting: Infrequent  S: Seen on CRRT, wife at bedside, questions answered 0.4 L [name] reported in the past 24 hours Net -2.8 L overall in the past 24 hours Morning lab with K4.6 and phosphorus 3.3 Remains on low-dose norepinephrine   O: 01/11 0701 - 01/12 0700 In: 2614.7 [P.O.:200; I.V.:457.7; NG/GT:1180.8; IV Piggyback:776.3] Out: 5407.6 [Urine:380; Drains:200]  Filed Weights   12/15/24 0414 12/15/24 0415 12/16/24 0500  Weight: 108.2 kg 108.2 kg 106.2 kg    Recent Labs  Lab 12/15/24 0205 12/15/24 1619 12/16/24 0218  NA 128* 132* 134*  K 5.4* 5.1 4.6  CL 93* 97* 97*  CO2 24 25 26   GLUCOSE 325* 295* 191*  BUN 69* 55* 50*  CREATININE 2.90* 2.14* 1.90*  CALCIUM  7.9* 8.6* 8.9  PHOS 2.7 2.0* 3.3   Recent Labs  Lab 12/11/24 0746 12/12/24 0655 12/13/24 0205 12/14/24 0225 12/16/24 0218  WBC 8.9   < > 15.4* 16.7* 11.5*  NEUTROABS 6.1  --   --   --   --   HGB 12.0*   < > 9.7* 8.7* 8.4*  HCT 36.2*   < > 28.7* 26.6* 25.2*  MCV 90.7   < > 90.0 91.1 92.3  PLT 194   < > 180 153 164   < > = values in this interval not displayed.    Scheduled Meds:  acidophilus  1 capsule Oral Daily   vitamin C   1,000 mg Per Tube Daily   atorvastatin   80 mg Per Tube Daily   Chlorhexidine  Gluconate Cloth  6 each Topical Q0600   feeding supplement (PROSource TF20)  60 mL Per Tube BID   ferrous sulfate   300 mg Per Tube Q breakfast   gabapentin   100 mg Per Tube Q8H   heparin  injection (subcutaneous)  5,000 Units Subcutaneous Q8H   insulin  aspart  0-15 Units Subcutaneous Q4H   insulin  aspart  5 Units Subcutaneous Q4H   insulin  glargine  20 Units Subcutaneous BID   midodrine   10 mg Per Tube TID WC   multivitamin with  minerals  1 tablet Per Tube q AM   nutrition supplement (JUVEN)  1 packet Per Tube BID BM   pantoprazole   40 mg Oral BID   sodium chloride  flush  3 mL Intravenous Q12H   thiamine   100 mg Per Tube Daily   Continuous Infusions:  feeding supplement (GLUCERNA 1.5 CAL)     linezolid  (ZYVOX ) IV Stopped (12/15/24 2304)   norepinephrine  (LEVOPHED ) Adult infusion 2 mcg/min (12/16/24 0900)   piperacillin -tazobactam (ZOSYN )  IV 3.375 g (12/16/24 0524)   prismasol  BGK 4/2.5 400 mL/hr at 12/16/24 0525   prismasol  BGK 4/2.5 400 mL/hr at 12/16/24 0525   prismasol  BGK 4/2.5 1,500 mL/hr at 12/16/24 0941   vasopressin  Stopped (12/15/24 1500)   PRN Meds:.acetaminophen , albuterol , dextromethorphan , docusate, EPINEPHrine , fentaNYL  (SUBLIMAZE ) injection, heparin , heparin , hydrOXYzine , methocarbamol , mupirocin  ointment, olopatadine , ondansetron , oxyCODONE , phenol, polyethylene glycol, potassium chloride , sodium chloride  flush, traZODone   ABG    Component Value Date/Time   PHART 7.362 12/27/2016 0334   PCO2ART 45.4 12/27/2016 0334   PO2ART 115 (H) 12/27/2016 0334   HCO3 28.5 (H) 12/15/2024 1437   TCO2 24 07/24/2024 0019   ACIDBASEDEF 2.2 (H) 12/14/2024 9077  O2SAT 77.7 12/15/2024 1437    A/P  Dialysis dependent oliguric AKI on CKD 3 from ATN on CRRT Shock, improved pressor requirements in the past 24 hours, now on single agent low-dose norepinephrine  Hypervolemia: Improving with UF Hyponatremia, improving with CRRT History of idiopathic pulmonary fibrosis and OSA Status post left BKA with orthopedics 12/11/2024 with wound VAC in place ABLA with stable hemoglobin of 8.4  Moved to all 4K fluids.  Continue UF at current rate.  Likely can transition off of CRRT in the next 24 hours.  Discussed with primary RN, Dr. Lunette, family/patient.  Bernardino Gasman, MD [facility] pgr [phone]

## 2024-12-16 NOTE — Progress Notes (Signed)
 Diarrhea, unable to contain, placed rectal pouch 3 different times without success. Contacted elink. Will need flexiseal

## 2024-12-16 NOTE — TOC Progression Note (Signed)
 Transition of Care [facility]) - Progression Note    Patient Details  Name: Shane [name] MRN: [id number] Date of Birth: [birth year]  Transition of Care [facility]) CM/SW Contact  Tom-Johnson, Daris Harkins Daphne, RN Phone Number: 12/16/2024, 3:50 PM  Clinical Narrative:     Patient started on CRRT, CIR following at a distance for potential admit.   CM will continue to follow as patient progresses with care towards discharge.                     Expected Discharge Plan and Services                                               Social Drivers of Health (SDOH) Interventions SDOH Screenings   Food Insecurity: No Food Insecurity (12/11/2024)  Housing: Low Risk (12/11/2024)  Transportation Needs: No Transportation Needs (12/11/2024)  Utilities: Not At Risk (12/11/2024)  Financial Resource Strain: Low Risk  (08/22/2023)   Received from [facility]  Social Connections: Moderately Integrated (12/11/2024)  Tobacco Use: Low Risk (12/04/2024)    Readmission Risk Interventions    12/15/2023    2:29 PM  Readmission Risk Prevention Plan  Transportation Screening Complete  Medication Review (RN Care Manager) Complete  PCP or Specialist appointment within 3-5 days of discharge Complete  HRI or Home Care Consult Complete  SW Recovery Care/Counseling Consult Complete  Palliative Care Screening Complete  Skilled Nursing Facility Not Applicable

## 2024-12-16 NOTE — Inpatient Diabetes Management (Signed)
 Inpatient Diabetes Program Recommendations  AACE/ADA: New Consensus Statement on Inpatient Glycemic Control (2015)  Target Ranges:  Prepandial:   less than 140 mg/dL      Peak postprandial:   less than 180 mg/dL (1-2 hours)      Critically ill patients:  140 - 180 mg/dL   Lab Results  Component Value Date   GLUCAP 198 (H) 12/16/2024   HGBA1C 7.6 (H) 12/11/2024    Review of Glycemic Control  Latest Reference Range & Units 12/16/24 03:57 12/16/24 07:48 12/16/24 11:34  Glucose-Capillary 70 - 99 mg/dL 823 (H) 836 (H) 801 (H)   Diabetes history: DM 2 Outpatient Diabetes medications:  Humalog  15-20 units tid with meals Tresiba  20 units q AM Current orders for Inpatient glycemic control:  Novolog  0-15 units q 4 hours [place] 1.4- 50 ml/hr Lantus  20 units bid Novolog  5 units q 4 hours  Inpatient Diabetes Program Recommendations:    CBG's improved.  Note that tube feeds are being changed to [employer] (instead of Glucerna).  If CBG's increase with this change, consider increasing tube feed coverage q 4 hours.  Will follow.   Thanks,  Randall Bullocks, RN, BC-ADM Inpatient Diabetes Coordinator Pager [phone]  (8a-5p)

## 2024-12-16 NOTE — Progress Notes (Addendum)
 CRRT alarming return extremely negative and then for return disconnected. Lines assessed from patient to pump no lines disconnected but air noted in the filter set. Blood not returned. Reached out to Nepho MD on call Macel as wife at bedside stated potential for CRRT to be stopped on day shift today. Per MD okay to leave CRRT off for now.

## 2024-12-16 NOTE — Progress Notes (Signed)
 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 CRRT started 1/10. PHMx: idiopathic pulmonary fibrosis, OSA, HTN, DM2, CKD, Afib, CHF, CABG, R BKA 2022.    PT Comments  Currently pt is limited due to multifactorial medical issues. Pt was Min A +2 for sitting up in the bed without back support for 2 trials. Family is very supportive and pt is motivated. Pt was lethargic today; improved with sitting upright but then fatigued quickly. Due to pt current functional status, home set up and available assistance at home recommending skilled physical therapy services > 3 hours/day in order to address strength, balance and functional mobility to decrease risk for falls, injury, immobility, skin break down and re-hospitalization.     If plan is discharge home, recommend the following: A lot of help with walking and/or transfers;Help with stairs or ramp for entrance;Assist for transportation;Assistance with cooking/housework     Equipment Recommendations  None recommended by PT       Precautions / Restrictions Precautions Precautions: Fall Recall of Precautions/Restrictions: Intact Precaution/Restrictions Comments: watch BP Restrictions Weight Bearing Restrictions Per Provider Order: Yes LLE Weight Bearing Per Provider Order: Non weight bearing     Mobility  Bed Mobility Overal bed mobility: Needs Assistance Bed Mobility: Supine to Sit     Supine to sit: Min assist, +2 for physical assistance, +2 for safety/equipment     General bed mobility comments: First chair position and then long sitting in the bed with Min-mod +2 assist at trunk. Pt with core strength BP did well with no drop in  chair position in bed and long sitting without back support. Pt denies dizziness but did display some mild nausea    Transfers   General transfer comment: unable at this time.       Balance Overall balance assessment: Needs assistance Sitting-balance support: Bilateral upper extremity supported Sitting balance-Leahy Scale: Poor Sitting balance - Comments: assist for sitting with bed in chair position and core leaned forward off bed surface      Communication Communication Communication: Impaired Factors Affecting Communication: Difficulty expressing self;Reduced clarity of speech  Cognition Arousal: Lethargic, Alert (incr arousal once sitting upright in chair position) Behavior During Therapy: Flat affect   PT - Cognitive impairments: Orientation   Orientation impairments: Situation, Time, Place   Following commands: Impaired Following commands impaired: Follows one step commands with increased time    Cueing Cueing Techniques: Verbal cues, Tactile cues     General Comments General comments (skin integrity, edema, etc.): Daughter Olam present and supportive. Reviewed HEP with daughter and RN for ROM/strength.      Pertinent Vitals/Pain Pain Assessment Pain Assessment: Faces Faces Pain Scale: No hurt Breathing: normal Negative Vocalization: none Facial Expression: smiling or inexpressive Body Language: relaxed Consolability: no need to console PAINAD Score: 0 Pain Intervention(s): Monitored during session     PT Goals (current goals can now be found in the care plan section) Acute Rehab PT Goals Patient Stated Goal: improve mobility PT Goal Formulation: With patient/family Time For Goal Achievement: 12/27/24 Potential to Achieve Goals: Good Additional Goals Additional Goal #1: Pt will wheel himself 25 ft with bil UE or with RLE prosthetic in order to increase independence with  max A managing W/C parts. Progress towards PT goals: Not progressing toward goals -  comment (currently pt is limited due to medical status.)    Frequency    Min 3X/week      PT Plan  Continue with current POC     Co-evaluation   Reason for Co-Treatment: For patient/therapist safety (reduced activity tolerance) PT goals addressed during session: Mobility/safety with mobility;Strengthening/ROM OT goals addressed during session: Strengthening/ROM      AM-PAC PT 6 Clicks Mobility   Outcome Measure  Help needed turning from your back to your side while in a flat bed without using bedrails?: A Lot Help needed moving from lying on your back to sitting on the side of a flat bed without using bedrails?: Total Help needed moving to and from a bed to a chair (including a wheelchair)?: Total Help needed standing up from a chair using your arms (e.g., wheelchair or bedside chair)?: Total Help needed to walk in hospital room?: Total Help needed climbing 3-5 steps with a railing? : Total 6 Click Score: 7    End of Session Equipment Utilized During Treatment: Oxygen Activity Tolerance: Patient limited by lethargy Patient left: in bed;with call bell/phone within reach;with bed alarm set;with family/visitor present Nurse Communication: Mobility status;Need for lift equipment PT Visit Diagnosis: Other abnormalities of gait and mobility (R26.89)     Time: 8861-8840 PT Time Calculation (min) (ACUTE ONLY): 21 min  Charges:    $Therapeutic Activity: 8-22 mins PT General Charges $$ ACUTE PT VISIT: 1 Visit                    Dorothyann Maier, DPT, CLT  Acute Rehabilitation Services Office: [phone] (Secure chat preferred)    Dorothyann VEAR Maier 12/16/2024, 1:44 PM

## 2024-12-16 NOTE — Plan of Care (Signed)
" °  Problem: Education: Goal: Knowledge of the prescribed therapeutic regimen will improve Outcome: Progressing   Problem: Bowel/Gastric: Goal: Gastrointestinal status for postoperative course will improve Outcome: Progressing   Problem: Cardiac: Goal: Ability to maintain an adequate cardiac output Outcome: Progressing Goal: Will show no evidence of cardiac arrhythmias Outcome: Progressing   "

## 2024-12-16 NOTE — Progress Notes (Signed)
 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 CRRT started 1/10. PHMx: idiopathic pulmonary fibrosis, OSA, HTN, DM2, CKD, Afib, CHF, CABG, R BKA 2022.   OT comments  Pt progressing toward established OT goals slowly. Started on CRRT this weekend. Lethargic on arrival, but arouses to name call with difficulty maintaining arousal initially, but with HOB elevated/moved to chair position, improving arousal noted. Pt continues with decreased activity tolerance, but improving. Engaged in BUE ROM and therapeutic activity pulling trunk off bed surface into long sit with good tolerance. Educated CHARITY FUNDRAISER and daughter regarding actively engaging pt as well as A/AROM. Will continue to follow. Continue to recommend intensive inpatient rehab >3 hours once pt medically ready.       If plan is discharge home, recommend the following:  Two people to help with walking and/or transfers;Two people to help with bathing/dressing/bathroom;Assistance with cooking/housework;Assistance with feeding;Direct supervision/assist for medications management;Direct supervision/assist for financial management;Assist for transportation;Help with stairs or ramp for entrance   Equipment Recommendations  Other (comment) (TBD next venue)    Recommendations for Other Services      Precautions / Restrictions Precautions Precautions: Fall Recall of Precautions/Restrictions: Intact Precaution/Restrictions Comments: watch BP Restrictions Weight Bearing Restrictions Per Provider Order: Yes LLE Weight Bearing Per Provider Order: Non weight bearing       Mobility Bed Mobility Overal bed mobility: Needs Assistance Bed  Mobility: Supine to Sit     Supine to sit: Min assist, +2 for physical assistance, +2 for safety/equipment     General bed mobility comments: First chair position and then long sitting in the bed with Min-mod +2 assist at trunk. Pt with core strength BP did well with no drop in chair position in bed and long sitting without back support. Pt denies dizziness but did display some mild nausea    Transfers                         Balance Overall balance assessment: Needs assistance Sitting-balance support: Bilateral upper extremity supported Sitting balance-Leahy Scale: Poor Sitting balance - Comments: assist for sitting with bed in chair position and core leaned forward off bed surface                                   ADL either performed or assessed with clinical judgement   ADL                                         General ADL Comments: total A-total A +2 at bed level    Extremity/Trunk Assessment Upper Extremity Assessment Upper Extremity Assessment: Right hand dominant;RUE deficits/detail;LUE deficits/detail;Generalized weakness RUE Deficits / Details: Both hand edematous (L>R) and heavy when attempts to move them. able to flex and extend digits but not to full range. good initiation with min A to touch face RUE Coordination: decreased fine motor;decreased gross motor LUE Deficits / Details: Both hand edematous (L>R) and heavy when attempts to move them. limited initiation hand ROM; Provided PROM WFL; limited  by edema. LUE Coordination: decreased gross motor;decreased fine motor   Lower Extremity Assessment Lower Extremity Assessment: Defer to PT evaluation        Vision   Additional Comments: makes eye contact with therapist on both sides of his body with incresaed time. Cues to locate therapist on R; unsure if due to CRRT site on R   Perception     Praxis     Communication Communication Communication: Impaired Factors  Affecting Communication: Difficulty expressing self;Reduced clarity of speech (soft voice, minimal verbalizations; suspect will improve as pt improves medically)   Cognition Arousal: Lethargic, Alert (greater arousal once in chair position in bed; becoming letharicg with fatigue toward end of session) Behavior During Therapy: Flat affect Cognition: Cognition impaired   Orientation impairments: Time, Situation, Place (demonstrates some carryover after initial education stating that PT/OT work at [employer])     Attention impairment (select first level of impairment): Sustained attention (focused attention demonstrating; sustains with constant stim) Executive functioning impairment (select all impairments): Initiation (cues for following commands, slow processing, increased time to initiate tasks)                   Following commands: Impaired Following commands impaired: Follows one step commands with increased time (~10 seconds to initiate after command provided at times)      Cueing   Cueing Techniques: Verbal cues, Tactile cues  Exercises Exercises: Other exercises Other Exercises Other Exercises: AAROM to end ranges digit composite flex/ext RUE; PROM LUE digits/elbow/shoulder. AAROM hand to face Other Exercises: 2x pull forward with +2 assist to pull into long sitting with bed in chair position    Shoulder Instructions       General Comments daughter, Olam present and supportive. Reviewed with daughter and nursing a/AROM they can do to preserve pt strength    Pertinent Vitals/ Pain          Home Living                                          Prior Functioning/Environment              Frequency  Min 2X/week        Progress Toward Goals  OT Goals(current goals can now be found in the care plan section)  Progress towards OT goals: Progressing toward goals  Acute Rehab OT Goals OT Goal Formulation: With patient/family Time For Goal  Achievement: 12/27/24 Potential to Achieve Goals: Good ADL Goals Pt Will Perform Grooming: with min assist;bed level Pt/caregiver will Perform Home Exercise Program: Increased ROM;Both right and left upper extremity;With written HEP provided [facility]) Additional ADL Goal #1: Pt will be Min A to roll in bed for basic ADLs Additional ADL Goal #2: Pt will be able to maintain EOB balance with no more than min A  Plan      Co-evaluation    PT/OT/SLP Co-Evaluation/Treatment: Yes Reason for Co-Treatment: For patient/therapist safety (reduced activity tolerance) PT goals addressed during session: Mobility/safety with mobility;Strengthening/ROM OT goals addressed during session: Strengthening/ROM      AM-PAC OT 6 Clicks Daily Activity     Outcome Measure   Help from another person eating meals?: Total Help from another person taking care of personal grooming?: Total Help from another person toileting, which includes using toliet, bedpan, or urinal?: Total Help from another person bathing (including washing, rinsing, drying)?: Total Help from  another person to put on and taking off regular upper body clothing?: Total Help from another person to put on and taking off regular lower body clothing?: Total 6 Click Score: 6    End of Session    OT Visit Diagnosis: Other abnormalities of gait and mobility (R26.89);Muscle weakness (generalized) (M62.81);Pain Pain - Right/Left: Left Pain - part of body: Leg   Activity Tolerance Patient tolerated treatment well;Patient limited by fatigue   Patient Left in bed;with call bell/phone within reach;with family/visitor present;with bed alarm set   Nurse Communication Mobility status (left in chair position in bed with daughter present with SLP entering room)        Time: 8861-8797 OT Time Calculation (min): 24 min  Charges: OT General Charges $OT Visit: 1 Visit OT Treatments $Therapeutic Activity: 8-22 mins  Elma JONETTA Penner, OTD, OTR/L [facility]  Acute Rehabilitation Office: [phone]   Elma JONETTA Penner 12/16/2024, 12:57 PM

## 2024-12-16 NOTE — Plan of Care (Signed)
" °  Problem: Education: Goal: Knowledge of the prescribed therapeutic regimen will improve Outcome: Progressing   Problem: Bowel/Gastric: Goal: Gastrointestinal status for postoperative course will improve Outcome: Progressing   Problem: Cardiac: Goal: Ability to maintain an adequate cardiac output Outcome: Progressing   Problem: Nutritional: Goal: Will attain and maintain optimal nutritional status Outcome: Progressing   Problem: Respiratory: Goal: Will regain and/or maintain adequate ventilation Outcome: Progressing   Problem: Tissue Perfusion: Goal: Adequacy of tissue perfusion will improve Outcome: Progressing   Problem: Self-Care: Goal: Ability to meet self-care needs will improve Outcome: Progressing   Problem: Clinical Measurements: Goal: Postoperative complications will be avoided or minimized Outcome: Progressing   "

## 2024-12-16 NOTE — Progress Notes (Signed)
 Pharmacy Phosphorus Replacement  Recent Labs:  Recent Labs    12/16/24 0218 12/16/24 1553  K 4.6 4.6  MG 2.3  --   PHOS 3.3 2.2*  CREATININE 1.90* 1.70*    Low Critical Values (K </= 2.5, Phos </= 1, Mg </= 1) Present: None  Plan:  Sodium phosphate  x 1 Q12h CRRT labs  Vito Ralph, PharmD, BCPS Please see amion for complete clinical pharmacist phone list 12/16/2024 5:37 PM

## 2024-12-16 NOTE — Progress Notes (Signed)
 Speech Language Pathology Treatment: Dysphagia  Patient Details Name: Shane [name] MRN: [id number] DOB: May 30, 1941 Today's Date: 12/16/2024 Time: 1201-1223 SLP Time Calculation (min) (ACUTE ONLY): 22 min  Assessment / Plan / Recommendation Clinical Impression  Pt was seen in chair position today after working with PT/OT. He was fatigued from their session, but still responsive to cues when given extra time and cues for sustained attention. Pt required increased cueing initially for awareness of boluses and initiation of oral preparation and transit. Step-by-step cueing was faded to more minimal cueing as trials continued. Pt had occasional coughing after liquids, as well as a delayed cough at the end of all POs. Pt took several swallows per bite of puree. Per nursing, pt has been doing well with ice chips but his mentation continues to fluctuate throughout the day. Daughter at bedside for session and education on recommendations.  PLAN: Recommend that pt remain NPO except for ice chips after oral care as mentation allows. He is making gradual progress towards a PO diet, still mostly impacted by mentation, but will determine need for any further instrumental testing as mentation clears further.    HPI HPI: 84 yo male admitted 1/7 for L transtibial amputation. Transferred to ICU 1/8 for hypotension. CRRT started 1/10. Seen by SLP 12/2020 and 12/2023 with concern for primary esophageal dysphagia, complaining of difficulty with solids>liquids and episodes of regurgitation. Declined an MBS and although recommended, esophageal assessment was not been completed. 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 CCC-SLP Acute Rehabilitation Services Office: [phone]  Secure chat preferred   12/16/2024, 12:57 PM

## 2024-12-16 NOTE — Progress Notes (Addendum)
 "  NAME:  Shane [name], MRN:  [id number], DOB:  1941-09-17, LOS: 5 ADMISSION DATE:  12/11/2024, CONSULTATION DATE:  12/12/24 REFERRING MD:  Dr. Harden, CHIEF COMPLAINT:  hypotension   History of Present Illness:  84 year old male with past medical history as below, which is significant for chronic kidney disease stage III, diabetes, hypertension, hyperlipidemia, IPF, OSA on CPAP and at bedtime 2L [place], and HFrEF with LVEF 30 to 35%.  He presented to [facility] 1/7 after failing conservative outpatient therapy for ischemic lower extremity ulcers and left foot with gangrene.  He underwent transtibial amputation and was subsequently sent to the medical floor for recovery.  In the early a.m. hours 1/8 he became hypotensive which did improve temporarily with crystalloid bolus, however, he again became hypotensive with systolic blood pressures in the 70s and 80s.  PCCM was asked to evaluate on this basis.   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  Significant Hospital Events: Including procedures, antibiotic start and stop dates in addition to other pertinent events   1/7 admit for L transtibial amputation 1/7 1/8 hypotensive, tx to ICU on pressors, albumin , lactic  resolved  Interim History / Subjective:  On levo 6mcg, off vasopressin  Glucose better control with addition of insulin , continue to monitor  Patient will open eyes to verbal stimuli, does not answer questions appropriately including his name, location, or his wife who is at bedside.  Objective    Blood pressure (!) 130/48, pulse 71, temperature 98.1 F (36.7 C), temperature source Bladder, resp. rate 19, height 6' (1.829 m), weight 106.2 kg, SpO2 97%.        Intake/Output Summary (Last 24 hours) at 12/16/2024 0954 Last data filed at 12/16/2024 0900 Gross per 24 hour  Intake 2590.45 ml  Output 5080.6 ml  Net -2490.15 ml   Filed Weights   12/15/24 0414 12/15/24 0415 12/16/24 0500  Weight: 108.2 kg 108.2 kg 106.2 kg    Examination: General: Ill-appearing male, lying in bed HEENT: NCAT, coretrak in place Chest: clear throughout CVS: S1-S2 normal, no murmur GI: Soft nontender nondistended Extremities: Warm and dry, bilateral BKA's,-left BKA wound VAC dressing in place Neuro: Still lethargic, briefly opens eyes to verbal stimuli   Resolved problem list    Assessment and Plan   84 year old male with history of CKD 3, DM, HFrEF 30-35%, prior right BKA, who presented with left foot gangrene s/p left transtibial amputation on 1/7, then presented with hypotension postprocedure likely due to septic shock. No bleeding, Hb stable, lactic acid 2.6 Patient is on vasopressors, and started on CRRT on 1/10 for oliguria refractory to Lasix  gtt.  Acute metabolic encephalopathy, possible hospital delirium  Septic shock-likely due to left foot gangrene s/p left transtibial amputation on 1/7 Lactic acidosis -Off vasopressin  1/12 -On levophed  -Continue tube feeds   AKI on CKD 3B -nephrology following -continue CRRT, started 1/10; refractory to lasix  gtt -Cr improving, 1.9. urine output 380cc the past 24  hours  Hyperglycemia Type II Diabetes -glucose improving, monitor with goal  140-170 -Lantus  20 units, humalog  5 units TID, with SSI coverage  HFrEF EF 30-35% HTN A-fib on home Eliquis , on hold s/p surgery    OSA on CPAP nightly, 2L [place] Severe PAD s/p bilateral BKA's       CRITICAL CARE: 32 minutes Alaycia Eardley, PA-C [facility] For pager details, please see AMION or use Epic chat  After 1900, please call ELINK for cross coverage needs 12/16/2024, 9:57 AM              "

## 2024-12-16 NOTE — Progress Notes (Signed)
 Patient ID: Shane [name], male   DOB: May 18, 1941, 84 y.o.   MRN: [id number] Patient is seen in follow-up status post transtibial amputation there is 200 cc in the wound VAC canister without any active drainage at this time.  Patient is more alert and oriented still very weak and tired.

## 2024-12-16 NOTE — Progress Notes (Signed)
 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 - remains applicable  GOAL:  Patient will meet greater than or equal to 90% of their needs - goal met via TF  MONITOR:  TF tolerance, Labs  REASON FOR ASSESSMENT:  Consult  (CRRT protocol)  ASSESSMENT:  Pt with PMH of CKD stage III, DM, HTN, HLD, Idiopathic pulmonary fibrosis, ILD, OSA on CPAP, CHF with LVEF 30-35% who failed outpatient treatment for L foot wound and admitted for amputation.  1/7 - admitted for L transtibial amputation  1/8 - became hypotensive, tx ICU on pressors 1/9 - NPO; s/p cortrak placement tip in gastric antrum per xray  1/10 - CRRT initiated d/t post op hypotension/shock with volume overload 1/12 - SLP bedside evaluation- recommend continue NPO except for ice chips  Pt discussed in rounds and separately with SLP. Mentation improving though remains quite lethargic. Continues to remain NPO.  Remains on CRRT.  Currently off pressor support.   Pt started on Vital 1.5 and transitioned to Glucerna 1.5 yesterday d/t persistently elevated blood sugar. Insulin  regimen also increased yesterday.  CBG's reflect a trend down though range from 163-210.  Discussed tube feeding regimen with CCM.  Patient having frequent stool output which is likely multifactorial as pt had been on miralax  (received 2 doses between 1/9-11/1), antibiotics and acute illness. Pt's  abdomen appears distended though soft. Pt's daughter at bedside reports his abdomen is somewhat more distended than baseline though not of significant difference.  Given acute illness and current diarrhea, would recommend a more semi-elemental formula lower in fat and fiber for ease of digest-ability. Mallie Pinion at goal rate provides ~ 45g more CHO per day. Consult placed to in-patient DM coordinator team for insulin  assessment.   [name]: x24 hours Left leg wound VAC: output x24 hours  CRRT Net UF: x24 hours  Admit weight: 103.4 kg Current weight: 106.2 kg  Medications: acidophilus daily, Vitamin C  1000mg  daily, ferrous sulfate  daily, SSI 0-15 units q4h, novolog  8 units q4h, lantus  20 units BID, MVI, thiamine  daily, IV abx x2  Labs:  Sodium 134 Chloride 97 BUN 50 Cr 1.90 Albumin  3.2 GFR 35 Hgb 8.4 CBG's 163-210  Diet Order:   Diet Order             Diet NPO time specified Except for: Ice Chips, Other (See Comments)  Diet effective now                   EDUCATION NEEDS:   Education needs have been addressed  Skin:  Skin Assessment: Reviewed RN Assessment (L leg incision)  Last BM:  1/12 x4 documented stools via rectal pouch  Height:   Ht Readings from Last 1 Encounters:  12/11/24 6' (1.829 m)    Weight:   Wt Readings from Last 1 Encounters:  12/16/24 106.2 kg    Ideal Body Weight:  76.1 kg (adjusted for L transtibial amputation)  BMI:  Body mass index is 33.7 kg/m. (Adjusted for L transtibial amputation)  Estimated Nutritional Needs:   Kcal:  1700-2000  Protein:  130-145g  Fluid:  1L + [name]  Shane [name], RDN, LDN Clinical Nutrition See AMiON for contact information.

## 2024-12-16 NOTE — Progress Notes (Signed)
 Inpatient Rehab Admissions Coordinator:   Note CRRT started over the weekend.  Will continue to follow distantly.   Reche Lowers, PT, DPT Admissions Coordinator [phone] 12/16/2024 8:48 AM

## 2024-12-17 DIAGNOSIS — Z89512 Acquired absence of left leg below knee: Secondary | ICD-10-CM

## 2024-12-17 DIAGNOSIS — G9340 Encephalopathy, unspecified: Secondary | ICD-10-CM

## 2024-12-17 DIAGNOSIS — R739 Hyperglycemia, unspecified: Secondary | ICD-10-CM

## 2024-12-17 LAB — MAGNESIUM: Magnesium: 2.5 mg/dL — ABNORMAL HIGH (ref 1.7–2.4)

## 2024-12-17 LAB — CBC
HCT: 25.9 % — ABNORMAL LOW (ref 39.0–52.0)
Hemoglobin: 8.6 g/dL — ABNORMAL LOW (ref 13.0–17.0)
MCH: 30.2 pg (ref 26.0–34.0)
MCHC: 33.2 g/dL (ref 30.0–36.0)
MCV: 90.9 fL (ref 80.0–100.0)
Platelets: 183 K/uL (ref 150–400)
RBC: 2.85 MIL/uL — ABNORMAL LOW (ref 4.22–5.81)
RDW: 15.2 % (ref 11.5–15.5)
WBC: 9.4 K/uL (ref 4.0–10.5)
nRBC: 0 % (ref 0.0–0.2)

## 2024-12-17 LAB — GLUCOSE, CAPILLARY
Glucose-Capillary: 175 mg/dL — ABNORMAL HIGH (ref 70–99)
Glucose-Capillary: 193 mg/dL — ABNORMAL HIGH (ref 70–99)
Glucose-Capillary: 197 mg/dL — ABNORMAL HIGH (ref 70–99)
Glucose-Capillary: 216 mg/dL — ABNORMAL HIGH (ref 70–99)
Glucose-Capillary: 220 mg/dL — ABNORMAL HIGH (ref 70–99)
Glucose-Capillary: 227 mg/dL — ABNORMAL HIGH (ref 70–99)

## 2024-12-17 LAB — RENAL FUNCTION PANEL
Albumin: 3.3 g/dL — ABNORMAL LOW (ref 3.5–5.0)
Anion gap: 10 (ref 5–15)
BUN: 57 mg/dL — ABNORMAL HIGH (ref 8–23)
CO2: 27 mmol/L (ref 22–32)
Calcium: 8.9 mg/dL (ref 8.9–10.3)
Chloride: 100 mmol/L (ref 98–111)
Creatinine, Ser: 2.17 mg/dL — ABNORMAL HIGH (ref 0.61–1.24)
GFR, Estimated: 29 mL/min — ABNORMAL LOW
Glucose, Bld: 205 mg/dL — ABNORMAL HIGH (ref 70–99)
Phosphorus: 2.9 mg/dL (ref 2.5–4.6)
Potassium: 4.2 mmol/L (ref 3.5–5.1)
Sodium: 136 mmol/L (ref 135–145)

## 2024-12-17 LAB — HEPATITIS B SURFACE ANTIGEN: Hepatitis B Surface Ag: NONREACTIVE

## 2024-12-17 LAB — SURGICAL PATHOLOGY

## 2024-12-17 MED ORDER — FENTANYL CITRATE (PF) 50 MCG/ML IJ SOSY: 25.0000 ug | PREFILLED_SYRINGE | Freq: Four times a day (QID) | INTRAMUSCULAR | Status: DC | PRN

## 2024-12-17 MED ORDER — ORAL CARE MOUTH RINSE
15.0000 mL | OROMUCOSAL | Status: DC
  Administered 2024-12-17 – 2024-12-20 (×11): 15 mL via OROMUCOSAL

## 2024-12-17 MED ORDER — ORAL CARE MOUTH RINSE: 15.0000 mL | OROMUCOSAL | Status: DC | PRN

## 2024-12-17 MED ORDER — PIPERACILLIN-TAZOBACTAM IN DEX 2-0.25 GM/50ML IV SOLN
2.2500 g | Freq: Three times a day (TID) | INTRAVENOUS | Status: AC
  Administered 2024-12-18 – 2024-12-19 (×6): 2.25 g via INTRAVENOUS
  Filled 2024-12-17 (×6): qty 50

## 2024-12-17 MED ORDER — INSULIN ASPART 100 UNIT/ML IJ SOLN
10.0000 [IU] | INTRAMUSCULAR | Status: DC
  Administered 2024-12-17 – 2024-12-24 (×37): 10 [IU] via SUBCUTANEOUS
  Filled 2024-12-17 (×36): qty 10

## 2024-12-17 MED ORDER — ORAL CARE MOUTH RINSE
15.0000 mL | OROMUCOSAL | Status: DC
  Administered 2024-12-17: 15 mL via OROMUCOSAL

## 2024-12-17 MED ORDER — PIPERACILLIN-TAZOBACTAM IN DEX 2-0.25 GM/50ML IV SOLN
2.2500 g | Freq: Four times a day (QID) | INTRAVENOUS | Status: DC
  Administered 2024-12-17: 2.25 g via INTRAVENOUS
  Filled 2024-12-17 (×4): qty 50

## 2024-12-17 MED ORDER — PIPERACILLIN-TAZOBACTAM 3.375 G IVPB: 3.3750 g | Freq: Three times a day (TID) | INTRAVENOUS | Status: DC

## 2024-12-17 MED ORDER — PIPERACILLIN-TAZOBACTAM IN DEX 2-0.25 GM/50ML IV SOLN
2.2500 g | Freq: Four times a day (QID) | INTRAVENOUS | Status: DC
  Administered 2024-12-17: 2.25 g via INTRAVENOUS
  Filled 2024-12-17 (×3): qty 50

## 2024-12-17 MED ORDER — MIDODRINE HCL 5 MG PO TABS
10.0000 mg | ORAL_TABLET | Freq: Three times a day (TID) | ORAL | Status: DC
  Administered 2024-12-17 – 2024-12-19 (×7): 10 mg
  Filled 2024-12-17 (×7): qty 2

## 2024-12-17 MED ORDER — INSULIN ASPART 100 UNIT/ML IJ SOLN
9.0000 [IU] | INTRAMUSCULAR | Status: DC
  Administered 2024-12-17: 9 [IU] via SUBCUTANEOUS
  Filled 2024-12-17: qty 9

## 2024-12-17 MED ORDER — INSULIN GLARGINE 100 UNIT/ML ~~LOC~~ SOLN
22.0000 [IU] | Freq: Two times a day (BID) | SUBCUTANEOUS | Status: DC
  Administered 2024-12-17 – 2024-12-24 (×15): 22 [IU] via SUBCUTANEOUS
  Filled 2024-12-17 (×17): qty 0.22

## 2024-12-17 MED ORDER — MELATONIN 3 MG PO TABS
3.0000 mg | ORAL_TABLET | Freq: Every day | ORAL | Status: DC
  Administered 2024-12-17 – 2024-12-19 (×3): 3 mg via ORAL
  Filled 2024-12-17 (×3): qty 1

## 2024-12-17 NOTE — Inpatient Diabetes Management (Signed)
 Inpatient Diabetes Program Recommendations  AACE/ADA: New Consensus Statement on Inpatient Glycemic Control (2015)  Target Ranges:  Prepandial:   less than 140 mg/dL      Peak postprandial:   less than 180 mg/dL (1-2 hours)      Critically ill patients:  140 - 180 mg/dL   Lab Results  Component Value Date   GLUCAP 193 (H) 12/17/2024   HGBA1C 7.6 (H) 12/11/2024    Review of Glycemic Control  Latest Reference Range & Units 12/16/24 19:19 12/16/24 23:34 12/17/24 03:27 12/17/24 07:24  Glucose-Capillary 70 - 99 mg/dL 827 (H) 791 (H) 802 (H) 193 (H)  (H): Data is abnormally high Diabetes history: DM 2 Outpatient Diabetes medications:  Humalog  15-20 units tid with meals Tresiba  20 units q AM Current orders for Inpatient glycemic control:  Novolog  0-15 units q 4 hours [place] 1.4- 50 ml/hr Lantus  22 units bid Novolog  8 units q 4 hours  Inpatient Diabetes Program Recommendations:    If CBG's remain>goal, consider slight increase of tube feed coverage to 9 units q 4 hours. (Note Lantus  was also increased today).   T Thanks,  Randall Bullocks, RN, BC-ADM Inpatient Diabetes Coordinator Pager [phone]  (8a-5p)

## 2024-12-17 NOTE — Progress Notes (Addendum)
 "  NAME:  Shane [name], MRN:  [id number], DOB:  1941/05/15, LOS: 6 ADMISSION DATE:  12/11/2024, CONSULTATION DATE:  12/12/24 REFERRING MD:  Dr. 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  He presented to [facility] 1/7 after failing conservative outpatient therapy for ischemic lower extremity ulcers and left foot with gangrene.  He underwent transtibial amputation and was subsequently sent to the medical floor for recovery.  In the early a.m. hours 1/8 he became hypotensive which did improve temporarily with crystalloid bolus, however, he again became hypotensive with systolic blood pressures in the 70s and 80s.  PCCM was asked to evaluate on this basis.   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  Significant Hospital Events: Including procedures, antibiotic start and stop dates in addition to other pertinent events   1/7 admit for L transtibial amputation 1/7 1/8 hypotensive, tx to ICU on pressors, albumin , lactic  resolved 1/12: off pressors, continues on CRRT 1/13: mental status improved, A&O x3  Interim History / Subjective:  Off pressors, BP stable. Patient is awake and alert, oriented x3, reports pain is controlled this morning, no acute complaints.  Spouse updated at bedside. Objective    Blood pressure (!) 134/52, pulse 66, temperature 98.8 F (37.1 C), resp. rate 18, height 6' (1.829 m), weight 101.3 kg, SpO2 99%.        Intake/Output Summary (Last 24 hours) at 12/17/2024 0741 Last data filed at 12/17/2024 0600 Gross per 24 hour  Intake 2387.89 ml  Output 3609.5 ml  Net -1221.61 ml   Filed Weights   12/15/24 0415 12/16/24 0500 12/17/24 0423  Weight: 108.2 kg 106.2 kg 101.3 kg    Examination: General: weak appearing, lying in bed, awake and alert today HEENT: NCAT, coretrak in place Chest: clear throughout CVS: S1-S2 normal, no murmur GI: Soft nontender nondistended Extremities: Warm and dry, bilateral BKA's,-left BKA wound VAC dressing in place Neuro: awake and alert, oriented x3, mental status improving   Resolved problem list   Acute metabolic encephalopathy   Assessment and Plan   84 year old male with history of CKD 3, DM, HFrEF 30-35%, prior right BKA, who presented with left foot gangrene s/p left transtibial amputation on 1/7, then presented with hypotension postprocedure likely due to septic shock. No bleeding, Hb stable, lactic acid 2.6  Acute metabolic encephalopathy, possible hospital delirium vs anesthesia- resolved   History of anesthesia induced encephalopathy   Septic shock-likely due to left foot gangrene s/p left BKA on 1/7 Lactic acidosis -Off vasopressin  and Levophed  1/12 -Continue midodrine  10mg  TID for now, BP 90s systolic, consider weaning as able -Zosyn  and Linezolid  (started 1/9) -Continue tube feeds for now, speech therapy consult for advancement of diet  -Continue PT/OT  AKI on CKD 3B -Nephrology following -CRRT started 1/10 due to  refractory to lasix  gtt -Cr 2.17, GFR 29 this morning; CRRT placed on hold overnight -123cc urine output the past 24 hours  Hyperglycemia Type II Diabetes -Monitor glucose with goal 140-170 -Lantus  increased to 22 units, humalog  increased to 8 units TID, with SSI coverage  HFrEF EF 30-35% HTN A-fib on home Eliquis  (restarted 1/12)   OSA on CPAP nightly, 2L [place] Severe PAD s/p bilateral BKA's       CRITICAL CARE: 30 minutes Jerret Mcbane, PA-C [facility] For pager details, please see AMION or use Epic chat  After 1900, please call ELINK for cross coverage needs 12/17/2024, 7:41 AM              "

## 2024-12-17 NOTE — Progress Notes (Signed)
 Patient ID: Shane [name], male   DOB: 15-Apr-1941, 84 y.o.   MRN: [id number] Patient continues to improve.  He is off his pressors with no new drainage in the wound VAC canister.  Anticipate patient's medical care to be reassumed by hospitalist management after discharge from the ICU.

## 2024-12-17 NOTE — Progress Notes (Signed)
 Inpatient Rehab Admissions Coordinator:   Note plans to trial iHD tomorrow.  Will follow for continued medical stabilization.   Reche Lowers, PT, DPT Admissions Coordinator [phone] 12/17/2024 3:11 PM

## 2024-12-17 NOTE — Progress Notes (Signed)
 Admit: 12/11/2024 LOS: 6   37M dialysis dependent AKI on CKD 3,   Subjective:  CRRT held overnight Mobilizing to chair this morning with Deitra lift, wife at bedside, updated and discussed status Blood pressure stable, no pressors [name] 0.1 L yesterday Morning labs K4.2, HCO3 27, BUN 57, phosphorus 2.9  01/12 0701 - 01/13 0700 In: 2387.9 [I.V.:40.7; NG/GT:1270.8; IV Piggyback:1076.4] Out: 3609.5 [Urine:123; Emesis/NG output:100; Drains:50]  Filed Weights   12/15/24 0415 12/16/24 0500 12/17/24 0423  Weight: 108.2 kg 106.2 kg 101.3 kg    Scheduled Meds:  acidophilus  1 capsule Oral Daily   apixaban   2.5 mg Per Tube BID   vitamin C   1,000 mg Per Tube Daily   atorvastatin   80 mg Per Tube Daily   Chlorhexidine  Gluconate Cloth  6 each Topical Q0600   feeding supplement (PROSource TF20)  60 mL Per Tube TID   ferrous sulfate   300 mg Per Tube Q breakfast   gabapentin   100 mg Per Tube Q8H   insulin  aspart  0-15 Units Subcutaneous Q4H   insulin  aspart  8 Units Subcutaneous Q4H   insulin  glargine  22 Units Subcutaneous BID   melatonin  3 mg Oral QHS   midodrine   10 mg Per Tube Q8H   multivitamin with minerals  1 tablet Per Tube q AM   nutrition supplement (JUVEN)  1 packet Per Tube BID BM   mouth rinse  15 mL Mouth Rinse Q2H   pantoprazole  (PROTONIX ) IV  40 mg Intravenous BID   sodium chloride  flush  3 mL Intravenous Q12H   thiamine   100 mg Per Tube Daily   Continuous Infusions:  feeding supplement (KATE FARMS STANDARD 1.4) Liquid 1,000 mL (12/17/24 1016)   norepinephrine  (LEVOPHED ) Adult infusion Stopped (12/16/24 1208)   piperacillin -tazobactam (ZOSYN )  IV     prismasol  BGK 4/2.5 400 mL/hr at 12/16/24 1846   prismasol  BGK 4/2.5 400 mL/hr at 12/16/24 1845   prismasol  BGK 4/2.5 1,500 mL/hr at 12/16/24 2004   PRN Meds:.acetaminophen , albuterol , dextromethorphan , docusate, EPINEPHrine , fentaNYL  (SUBLIMAZE ) injection, heparin , heparin , hydrOXYzine , methocarbamol , mupirocin  ointment,  olopatadine , ondansetron , mouth rinse, oxyCODONE , phenol, polyethylene glycol, sodium chloride  flush, traZODone   Current Labs: reviewed    Physical Exam:  Blood pressure (!) 123/53, pulse (!) 50, temperature 99 F (37.2 C), resp. rate 18, height 6' (1.829 m), weight 101.3 kg, SpO2 92%. Sitting in chair, Regular, no rub Diminished in the bases Wound VAC on the left leg amputation site  A Dialysis dependent oliguric AKI on CKD 3 from [facility] on req CRRT 1/10 through 12/16/2024 Shock, improved and off pressors Hypervolemia: Improving with UF Hyponatremia, resolved with CRRT History of idiopathic pulmonary fibrosis and OSA Status post left BKA with orthopedics 12/11/2024 with wound VAC in place ABLA with stable hemoglobin of 8.6  P Plan for intermittent hemodialysis tomorrow, orders written Medication Issues; Preferred narcotic agents for pain control are hydromorphone , fentanyl , and methadone. Morphine should not be used.  Baclofen should be avoided Avoid oral sodium phosphate  and magnesium  citrate based laxatives / bowel preps    Bernardino Gasman MD 12/17/2024, 10:28 AM  Recent Labs  Lab 12/16/24 0218 12/16/24 1553 12/17/24 0432  NA 134* 136 136  K 4.6 4.6 4.2  CL 97* 100 100  CO2 26 27 27   GLUCOSE 191* 203* 205*  BUN 50* 51* 57*  CREATININE 1.90* 1.70* 2.17*  CALCIUM  8.9 8.9 8.9  PHOS 3.3 2.2* 2.9   Recent Labs  Lab 12/11/24 0746 12/12/24 0655 12/14/24 0225  12/16/24 0218 12/17/24 0432  WBC 8.9   < > 16.7* 11.5* 9.4  NEUTROABS 6.1  --   --   --   --   HGB 12.0*   < > 8.7* 8.4* 8.6*  HCT 36.2*   < > 26.6* 25.2* 25.9*  MCV 90.7   < > 91.1 92.3 90.9  PLT 194   < > 153 164 183   < > = values in this interval not displayed.

## 2024-12-18 ENCOUNTER — Other Ambulatory Visit: Payer: Self-pay

## 2024-12-18 DIAGNOSIS — N179 Acute kidney failure, unspecified: Secondary | ICD-10-CM | POA: Diagnosis not present

## 2024-12-18 DIAGNOSIS — G9340 Encephalopathy, unspecified: Secondary | ICD-10-CM

## 2024-12-18 DIAGNOSIS — I5022 Chronic systolic (congestive) heart failure: Secondary | ICD-10-CM

## 2024-12-18 DIAGNOSIS — E440 Moderate protein-calorie malnutrition: Secondary | ICD-10-CM

## 2024-12-18 LAB — CULTURE, BLOOD (ROUTINE X 2)
Culture: NO GROWTH
Culture: NO GROWTH
Special Requests: ADEQUATE

## 2024-12-18 LAB — GLUCOSE, CAPILLARY
Glucose-Capillary: 174 mg/dL — ABNORMAL HIGH (ref 70–99)
Glucose-Capillary: 181 mg/dL — ABNORMAL HIGH (ref 70–99)
Glucose-Capillary: 185 mg/dL — ABNORMAL HIGH (ref 70–99)
Glucose-Capillary: 186 mg/dL — ABNORMAL HIGH (ref 70–99)
Glucose-Capillary: 192 mg/dL — ABNORMAL HIGH (ref 70–99)
Glucose-Capillary: 210 mg/dL — ABNORMAL HIGH (ref 70–99)

## 2024-12-18 LAB — RENAL FUNCTION PANEL
Albumin: 3.2 g/dL — ABNORMAL LOW (ref 3.5–5.0)
Anion gap: 13 (ref 5–15)
BUN: 98 mg/dL — ABNORMAL HIGH (ref 8–23)
CO2: 24 mmol/L (ref 22–32)
Calcium: 9 mg/dL (ref 8.9–10.3)
Chloride: 98 mmol/L (ref 98–111)
Creatinine, Ser: 3.65 mg/dL — ABNORMAL HIGH (ref 0.61–1.24)
GFR, Estimated: 16 mL/min — ABNORMAL LOW
Glucose, Bld: 204 mg/dL — ABNORMAL HIGH (ref 70–99)
Phosphorus: 3.1 mg/dL (ref 2.5–4.6)
Potassium: 4.1 mmol/L (ref 3.5–5.1)
Sodium: 135 mmol/L (ref 135–145)

## 2024-12-18 LAB — CBC
HCT: 24.9 % — ABNORMAL LOW (ref 39.0–52.0)
Hemoglobin: 8.5 g/dL — ABNORMAL LOW (ref 13.0–17.0)
MCH: 30.5 pg (ref 26.0–34.0)
MCHC: 34.1 g/dL (ref 30.0–36.0)
MCV: 89.2 fL (ref 80.0–100.0)
Platelets: 193 K/uL (ref 150–400)
RBC: 2.79 MIL/uL — ABNORMAL LOW (ref 4.22–5.81)
RDW: 14.8 % (ref 11.5–15.5)
WBC: 9.6 K/uL (ref 4.0–10.5)
nRBC: 0 % (ref 0.0–0.2)

## 2024-12-18 LAB — HEPATITIS B SURFACE ANTIBODY, QUANTITATIVE: Hep B S AB Quant (Post): <3.5 m[IU]/mL — ABNORMAL LOW

## 2024-12-18 LAB — MAGNESIUM: Magnesium: 2.7 mg/dL — ABNORMAL HIGH (ref 1.7–2.4)

## 2024-12-18 MED ORDER — LOPERAMIDE HCL 1 MG/7.5ML PO SUSP
4.0000 mg | Freq: Three times a day (TID) | ORAL | Status: DC | PRN
  Administered 2024-12-21 – 2024-12-25 (×2): 4 mg
  Filled 2024-12-18 (×4): qty 30

## 2024-12-18 MED ORDER — LOPERAMIDE HCL 1 MG/7.5ML PO SUSP
4.0000 mg | Freq: Three times a day (TID) | ORAL | Status: AC
  Administered 2024-12-18 – 2024-12-19 (×3): 4 mg
  Filled 2024-12-18 (×3): qty 30

## 2024-12-18 NOTE — Progress Notes (Signed)
 Admit: 12/11/2024 LOS: 7   59M dialysis dependent AKI on CKD 3,   Subjective:  Having some disturbances and confusion, family at bedside Only 0.3 L [name] in the past 24 hours BUN and creatinine significant increase consistent with low GFR For dialysis today  01/13 0701 - 01/14 0700 In: 1302.3 [NG/GT:950; IV Piggyback:352.3] Out: 375 [Urine:325; Drains:50]  Filed Weights   12/16/24 0500 12/17/24 0423 12/18/24 0500  Weight: 106.2 kg 101.3 kg 102.2 kg    Scheduled Meds:  acidophilus  1 capsule Oral Daily   apixaban   2.5 mg Per Tube BID   vitamin C   1,000 mg Per Tube Daily   atorvastatin   80 mg Per Tube Daily   Chlorhexidine  Gluconate Cloth  6 each Topical Q0600   feeding supplement (PROSource TF20)  60 mL Per Tube TID   ferrous sulfate   300 mg Per Tube Q breakfast   gabapentin   100 mg Per Tube Q8H   insulin  aspart  0-15 Units Subcutaneous Q4H   insulin  aspart  10 Units Subcutaneous Q4H   insulin  glargine  22 Units Subcutaneous BID   melatonin  3 mg Oral QHS   midodrine   10 mg Per Tube Q8H   multivitamin with minerals  1 tablet Per Tube q AM   nutrition supplement (JUVEN)  1 packet Per Tube BID BM   mouth rinse  15 mL Mouth Rinse 4 times per day   pantoprazole  (PROTONIX ) IV  40 mg Intravenous BID   sodium chloride  flush  3 mL Intravenous Q12H   thiamine   100 mg Per Tube Daily   Continuous Infusions:  feeding supplement (KATE FARMS STANDARD 1.4) Liquid 1,000 mL (12/18/24 0808)   piperacillin -tazobactam (ZOSYN )  IV 2.25 g (12/18/24 0535)   PRN Meds:.acetaminophen , albuterol , dextromethorphan , docusate, EPINEPHrine , fentaNYL  (SUBLIMAZE ) injection, hydrOXYzine , methocarbamol , mupirocin  ointment, olopatadine , ondansetron , mouth rinse, oxyCODONE , phenol, polyethylene glycol, sodium chloride  flush, traZODone   Current Labs: reviewed    Physical Exam:  Blood pressure (!) 131/54, pulse 72, temperature (!) 96.9 F (36.1 C), resp. rate 16, height 6' (1.829 m), weight 102.2 kg, SpO2  99%. Sitting in chair, Regular, no rub Diminished in the bases Wound VAC on the left leg amputation site  A Dialysis dependent oliguric AKI on CKD 3 from [facility] on req CRRT 1/10 through 12/16/2024 Shock, improved and off pressors Hypervolemia: Improving with UF Hyponatremia, resolved with RRT History of idiopathic pulmonary fibrosis and OSA Status post left BKA with orthopedics 12/11/2024 with wound VAC in place ABLA with stable hemoglobin  P No clear evidence of GFR recovery, HD today ordered Continue to follow on a daily basis Medication Issues; Preferred narcotic agents for pain control are hydromorphone , fentanyl , and methadone. Morphine should not be used.  Baclofen should be avoided Avoid oral sodium phosphate  and magnesium  citrate based laxatives / bowel preps    Bernardino Gasman MD 12/18/2024, 12:03 PM  Recent Labs  Lab 12/16/24 1553 12/17/24 0432 12/18/24 0515  NA 136 136 135  K 4.6 4.2 4.1  CL 100 100 98  CO2 27 27 24   GLUCOSE 203* 205* 204*  BUN 51* 57* 98*  CREATININE 1.70* 2.17* 3.65*  CALCIUM  8.9 8.9 9.0  PHOS 2.2* 2.9 3.1   Recent Labs  Lab 12/16/24 0218 12/17/24 0432 12/18/24 0515  WBC 11.5* 9.4 9.6  HGB 8.4* 8.6* 8.5*  HCT 25.2* 25.9* 24.9*  MCV 92.3 90.9 89.2  PLT 164 183 193

## 2024-12-18 NOTE — Plan of Care (Signed)
 " Problem: Education: Goal: Knowledge of the prescribed therapeutic regimen will improve Outcome: Progressing   Problem: Bowel/Gastric: Goal: Gastrointestinal status for postoperative course will improve Outcome: Progressing   Problem: Cardiac: Goal: Ability to maintain an adequate cardiac output Outcome: Progressing Goal: Will show no evidence of cardiac arrhythmias Outcome: Progressing   Problem: Nutritional: Goal: Will attain and maintain optimal nutritional status Outcome: Progressing   Problem: Neurological: Goal: Will regain or maintain usual level of consciousness Outcome: Progressing   Problem: Clinical Measurements: Goal: Ability to maintain clinical measurements within normal limits Outcome: Progressing Goal: Postoperative complications will be avoided or minimized Outcome: Progressing   Problem: Respiratory: Goal: Will regain and/or maintain adequate ventilation Outcome: Progressing Goal: Respiratory status will improve Outcome: Progressing   Problem: Skin Integrity: Goal: Demonstrates signs of wound healing without infection Outcome: Progressing   Problem: Urinary Elimination: Goal: Will remain free from infection Outcome: Progressing Goal: Ability to achieve and maintain adequate urine output Outcome: Progressing   Problem: Education: Goal: Knowledge of General Education information will improve Description: Including pain rating scale, medication(s)/side effects and non-pharmacologic comfort measures Outcome: Progressing   Problem: Health Behavior/Discharge Planning: Goal: Ability to manage health-related needs will improve Outcome: Progressing   Problem: Clinical Measurements: Goal: Ability to maintain clinical measurements within normal limits will improve Outcome: Progressing Goal: Will remain free from infection Outcome: Progressing Goal: Diagnostic test results will improve Outcome: Progressing Goal: Respiratory complications will  improve Outcome: Progressing Goal: Cardiovascular complication will be avoided Outcome: Progressing   Problem: Activity: Goal: Risk for activity intolerance will decrease Outcome: Progressing   Problem: Nutrition: Goal: Adequate nutrition will be maintained Outcome: Progressing   Problem: Coping: Goal: Level of anxiety will decrease Outcome: Progressing   Problem: Elimination: Goal: Will not experience complications related to bowel motility Outcome: Progressing Goal: Will not experience complications related to urinary retention Outcome: Progressing   Problem: Pain Managment: Goal: General experience of comfort will improve and/or be controlled Outcome: Progressing   Problem: Safety: Goal: Ability to remain free from injury will improve Outcome: Progressing   Problem: Skin Integrity: Goal: Risk for impaired skin integrity will decrease Outcome: Progressing   Problem: Education: Goal: Ability to describe self-care measures that may prevent or decrease complications (Diabetes Survival Skills Education) will improve Outcome: Progressing Goal: Individualized Educational Video(s) Outcome: Progressing   Problem: Coping: Goal: Ability to adjust to condition or change in health will improve Outcome: Progressing   Problem: Fluid Volume: Goal: Ability to maintain a balanced intake and output will improve Outcome: Progressing   Problem: Health Behavior/Discharge Planning: Goal: Ability to identify and utilize available resources and services will improve Outcome: Progressing Goal: Ability to manage health-related needs will improve Outcome: Progressing   Problem: Metabolic: Goal: Ability to maintain appropriate glucose levels will improve Outcome: Progressing   Problem: Nutritional: Goal: Maintenance of adequate nutrition will improve Outcome: Progressing Goal: Progress toward achieving an optimal weight will improve Outcome: Progressing   Problem: Skin  Integrity: Goal: Risk for impaired skin integrity will decrease Outcome: Progressing   Problem: Tissue Perfusion: Goal: Adequacy of tissue perfusion will improve Outcome: Progressing   Problem: Education: Goal: Knowledge of the prescribed therapeutic regimen will improve Outcome: Progressing Goal: Ability to verbalize activity precautions or restrictions will improve Outcome: Progressing Goal: Understanding of discharge needs will improve Outcome: Progressing   Problem: Activity: Goal: Ability to perform//tolerate increased activity and mobilize with assistive devices will improve Outcome: Progressing   Problem: Clinical Measurements: Goal: Postoperative complications will be avoided or minimized  Outcome: Progressing   Problem: Self-Care: Goal: Ability to meet self-care needs will improve Outcome: Progressing   "

## 2024-12-18 NOTE — Progress Notes (Signed)
 "  [facility] PROGRESS NOTE   Shane [name] FMW:[id number] DOB: 06-01-1941 DOA: 12/11/2024  PCP: Shepard Ade, MD  Brief History: 84 year old male with past medical history significant for chronic kidney disease stage III, diabetes, hypertension, hyperlipidemia, IPF, OSA on CPAP and at bedtime 2L Daniels, and HFrEF with LVEF 30 to 35%. He presented to [facility] 1/7 after failing conservative outpatient therapy for ischemic lower extremity ulcers and left foot with gangrene. He underwent transtibial amputation and was subsequently sent to the medical floor for recovery. In the early a.m. hours 1/8 he became hypotensive which did improve temporarily with crystalloid bolus, however, he again became hypotensive with systolic blood pressures in the 70s and 80s.  He was subsequently transferred to the ICU.  He was stabilized.  He was also noted to be in acute kidney injury.  Nephrology was consulted.  He underwent CRRT.  Stabilized and then transferred to the floor.  Consultants: Critical care medicine.  Nephrology.  Orthopedics.  Procedures: Left transtibial amputation 1/7    Subjective/Interval History: Patient somnolent arousable but goes right back to sleep.  His wife is at the bedside.    Assessment/Plan:  Septic shock Secondary to left foot gangrene.  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 Patient was in the ICU.  Required pressors.  Patient was stabilized. Patient was started on midodrine  which is being continued.  Blood pressure noted to be elevated at times.  May need to cut back on dose of midodrine . Noted to be on Zosyn .  Patient was also on linezolid .  Acute kidney injury on chronic kidney disease stage IIIb Patient had worsening in his renal function.  Nephrology was consulted.  Patient was started on CRRT.  Plan is for intermittent hemodialysis going forward.  Acute metabolic encephalopathy/hospital delirium Remains encephalopathic.  Reorient daily.   Discussed with patient's wife. No focal neurological deficits noted.  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  Continue SSI and glargine. HbA1c 7.6.  Chronic systolic CHF EF is 30 to 35% based on echocardiogram done on 12/12/2024. Cardiac status stable for the most part.  Volume being managed with hemodialysis.  Paroxysmal atrial fibrillation Eliquis  being continued.  Currently not on any rate limiting drugs due to his recent septic shock.  He is requiring midodrine  for blood pressure support.  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  Underwent left transtibial amputation during this hospitalization.  Orthopedics is following.  Normocytic anemia Likely anemia of chronic disease.  No evidence of overt bleeding.  Oropharyngeal dysphagia/moderate protein calorie malnutrition Has a cortrak feeding tube.  Speech therapy is following.  History of sleep apnea Resume CPAP once feeding tube has been discontinued.  DVT Prophylaxis: On apixaban  Code Status: Full code Family Communication: Discussed with his wife Disposition Plan: CIR is being considered    Medications: Scheduled:  acidophilus  1 capsule Oral Daily   apixaban   2.5 mg Per Tube BID   vitamin C   1,000 mg Per Tube Daily   atorvastatin   80 mg Per Tube Daily   Chlorhexidine  Gluconate Cloth  6 each Topical Q0600   feeding supplement (PROSource TF20)  60 mL Per Tube TID   ferrous sulfate   300 mg Per Tube Q breakfast   gabapentin   100 mg Per Tube Q8H   insulin  aspart  0-15 Units Subcutaneous Q4H   insulin  aspart  10 Units Subcutaneous Q4H   insulin  glargine  22  Units Subcutaneous BID   melatonin  3 mg Oral QHS   midodrine   10 mg Per Tube Q8H   multivitamin with minerals  1 tablet Per Tube q AM   nutrition supplement (JUVEN)  1 packet Per Tube BID BM   mouth rinse  15 mL Mouth Rinse 4 times per day   pantoprazole  (PROTONIX ) IV  40 mg  Intravenous BID   sodium chloride  flush  3 mL Intravenous Q12H   thiamine   100 mg Per Tube Daily   Continuous:  feeding supplement (KATE FARMS STANDARD 1.4) Liquid 1,000 mL (12/18/24 0808)   piperacillin -tazobactam (ZOSYN )  IV 2.25 g (12/18/24 0535)   PRN:acetaminophen , albuterol , dextromethorphan , docusate, EPINEPHrine , fentaNYL  (SUBLIMAZE ) injection, hydrOXYzine , methocarbamol , mupirocin  ointment, olopatadine , ondansetron , mouth rinse, oxyCODONE , phenol, polyethylene glycol, sodium chloride  flush, traZODone   Antibiotics: Anti-infectives (From admission, onward)    Start     Dose/Rate Route Frequency Ordered Stop   12/18/24 0600  piperacillin -tazobactam (ZOSYN ) IVPB 2.25 g        2.25 g 100 mL/hr over 30 Minutes Intravenous Every 8 hours 12/17/24 2149 12/20/24 0559   12/17/24 2030  piperacillin -tazobactam (ZOSYN ) IVPB 2.25 g  Status:  Discontinued        2.25 g 100 mL/hr over 30 Minutes Intravenous Every 6 hours 12/17/24 1738 12/17/24 2149   12/17/24 1430  piperacillin -tazobactam (ZOSYN ) IVPB 2.25 g  Status:  Discontinued        2.25 g 100 mL/hr over 30 Minutes Intravenous Every 6 hours 12/17/24 1335 12/17/24 1738   12/17/24 1400  piperacillin -tazobactam (ZOSYN ) IVPB 3.375 g  Status:  Discontinued        3.375 g 100 mL/hr over 30 Minutes Intravenous Every 8 hours 12/17/24 1010 12/17/24 1335   12/15/24 1430  linezolid  (ZYVOX ) IVPB 600 mg  Status:  Discontinued        600 mg 300 mL/hr over 60 Minutes Intravenous Every 12 hours 12/15/24 1343 12/17/24 1002   12/14/24 1800  piperacillin -tazobactam (ZOSYN ) IVPB 3.375 g  Status:  Discontinued        3.375 g 100 mL/hr over 30 Minutes Intravenous Every 6 hours 12/14/24 1350 12/17/24 1010   12/14/24 0945  vancomycin  (VANCOREADY) IVPB 1750 mg/350 mL        1,750 mg 175 mL/hr over 120 Minutes Intravenous  Once 12/14/24 0846 12/14/24 1250   12/13/24 1115  vancomycin  variable dose per unstable renal function (pharmacist dosing)  Status:   Discontinued         Does not apply See admin instructions 12/13/24 1115 12/15/24 1343   12/13/24 1115  vancomycin  (VANCOREADY) IVPB 2000 mg/400 mL        2,000 mg 200 mL/hr over 120 Minutes Intravenous  Once 12/13/24 1025 12/13/24 1533   12/13/24 1100  piperacillin -tazobactam (ZOSYN ) IVPB 3.375 g  Status:  Discontinued        3.375 g 12.5 mL/hr over 240 Minutes Intravenous Every 8 hours 12/13/24 1025 12/14/24 1350   12/11/24 0700  ceFAZolin  (ANCEF ) IVPB 2g/100 mL premix        2 g 200 mL/hr over 30 Minutes Intravenous On call to O.R. 12/11/24 0653 12/11/24 0843       Objective:  Vital Signs  Vitals:   12/18/24 0500 12/18/24 0738 12/18/24 1041 12/18/24 1044  BP:  (!) 153/56 (!) 158/57 (!) 150/53  Pulse:  68 70 70  Resp:  16 17 18   Temp:  (!) 96.9 F (36.1 C) (!) 96.9 F (36.1 C)   TempSrc:  Axillary    SpO2:  93% 100% 97%  Weight: 102.2 kg     Height:        Intake/Output Summary (Last 24 hours) at 12/18/2024 1058 Last data filed at 12/17/2024 2348 Gross per 24 hour  Intake 719.88 ml  Output 265 ml  Net 454.88 ml   Filed Weights   12/16/24 0500 12/17/24 0423 12/18/24 0500  Weight: 106.2 kg 101.3 kg 102.2 kg    General appearance: Somnolent.  Arousable.  No distress. Resp: Clear to auscultation bilaterally.  Normal effort Cardio: S1-S2 is normal regular.  No S3-S4.  No rubs murmurs or bruit GI: Abdomen is soft.  Nontender nondistended.  Bowel sounds are present normal.  No masses organomegaly Extremities: Mild edema bilateral lower extremities Neurologic: No focal neurological deficits.    Lab Results:  Data Reviewed: I have personally reviewed following labs and reports of the imaging studies  CBC: Recent Labs  Lab 12/13/24 0205 12/14/24 0225 12/16/24 0218 12/17/24 0432 12/18/24 0515  WBC 15.4* 16.7* 11.5* 9.4 9.6  HGB 9.7* 8.7* 8.4* 8.6* 8.5*  HCT 28.7* 26.6* 25.2* 25.9* 24.9*  MCV 90.0 91.1 92.3 90.9 89.2  PLT 180 153 164 183 193    Basic  Metabolic Panel: Recent Labs  Lab 12/14/24 0225 12/14/24 1603 12/15/24 0205 12/15/24 1619 12/16/24 0218 12/16/24 1553 12/17/24 0432 12/18/24 0515  NA 125*   < > 128* 132* 134* 136 136 135  K 4.9   < > 5.4* 5.1 4.6 4.6 4.2 4.1  CL 89*   < > 93* 97* 97* 100 100 98  CO2 22   < > 24 25 26 27 27 24   GLUCOSE 221*   < > 325* 295* 191* 203* 205* 204*  BUN 82*   < > 69* 55* 50* 51* 57* 98*  CREATININE 3.67*   < > 2.90* 2.14* 1.90* 1.70* 2.17* 3.65*  CALCIUM  8.2*   < > 7.9* 8.6* 8.9 8.9 8.9 9.0  MG 1.9  --  2.4  --  2.3  --  2.5* 2.7*  PHOS 4.5   < > 2.7 2.0* 3.3 2.2* 2.9 3.1   < > = values in this interval not displayed.    GFR: Estimated Creatinine Clearance: 19 mL/min (A) (by C-G formula based on SCr of 3.65 mg/dL (H)).  Liver Function Tests: Recent Labs  Lab 12/15/24 1619 12/16/24 0218 12/16/24 1553 12/17/24 0432 12/18/24 0515  ALBUMIN  3.1* 3.2* 3.1* 3.3* 3.2*    CBG: Recent Labs  Lab 12/17/24 1523 12/17/24 2046 12/17/24 2350 12/18/24 0445 12/18/24 0734  GLUCAP 227* 175* 220* 186* 174*     Recent Results (from the past 240 hours)  MRSA Next Gen by PCR, Nasal     Status: None   Collection Time: 12/12/24  2:03 PM   Specimen: Nasal Mucosa; Nasal Swab  Result Value Ref Range Status   MRSA by PCR Next Gen NOT DETECTED NOT DETECTED Final    Comment: (NOTE) The GeneXpert MRSA Assay (FDA approved for NASAL specimens only), is one component of a comprehensive MRSA colonization surveillance program. It is not intended to diagnose MRSA infection nor to guide or monitor treatment for MRSA infections. Test performance is not FDA approved in patients less than 41 years old. Performed at [facility], [address] [place], [place] [address]   Culture, blood (Routine X 2) w Reflex to ID Panel     Status: None   Collection Time: 12/13/24 11:15 AM  Specimen: BLOOD  Result Value Ref Range Status   Specimen Description BLOOD SITE NOT SPECIFIED  Final   Special  Requests   Final    BOTTLES DRAWN AEROBIC AND ANAEROBIC Blood Culture results may not be optimal due to an inadequate volume of blood received in culture bottles   Culture   Final    NO GROWTH 5 DAYS Performed at [facility], [address] [place], [place] [address]    Report Status 12/18/2024 FINAL  Final  Culture, blood (Routine X 2) w Reflex to ID Panel     Status: None   Collection Time: 12/13/24 11:15 AM   Specimen: BLOOD  Result Value Ref Range Status   Specimen Description BLOOD SITE NOT SPECIFIED  Final   Special Requests   Final    BOTTLES DRAWN AEROBIC AND ANAEROBIC Blood Culture adequate volume   Culture   Final    NO GROWTH 5 DAYS Performed at [facility], [address] [place], [place] [address]    Report Status 12/18/2024 FINAL  Final      Radiology Studies: No results found.     LOS: 7 days   Glenn Gullickson [employer] on [email or web address]  12/18/2024, 10:58 AM   "

## 2024-12-18 NOTE — Progress Notes (Signed)
 PT Cancellation Note  Patient Details Name: Shane [name] MRN: [id number] DOB: [birth year]   Cancelled Treatment:    Reason Eval/Treat Not Completed: Patient at procedure or test/unavailable (Off unit at HD. Will continue to follow and re-attempt as able.)  Kate ORN, PT, DPT Secure Chat Preferred  Rehab Office [phone]  Kate BRAVO [name] 12/18/2024, 10:53 AM

## 2024-12-18 NOTE — Care Management Important Message (Signed)
 Important Message  Patient Details  Name: Shane [name] MRN: [id number] Date of Birth: 11/27/1941   Important Message Given:  Yes - Medicare IM     [name] 12/18/2024, 10:42 AM

## 2024-12-18 NOTE — Progress Notes (Signed)
 SLP Cancellation Note  Patient Details Name: Taijon Vink MRN: [id number] DOB: 1941-10-20   Cancelled treatment:       Reason Eval/Treat Not Completed: Patient at procedure or test/unavailable (HD). Will f/u as able.    Damien Blumenthal, M.A., CCC-SLP Speech Language Pathology, Acute Rehabilitation Services  Secure Chat preferred [phone]  12/18/2024, 10:50 AM

## 2024-12-19 ENCOUNTER — Inpatient Hospital Stay (HOSPITAL_COMMUNITY)

## 2024-12-19 DIAGNOSIS — G9340 Encephalopathy, unspecified: Secondary | ICD-10-CM | POA: Diagnosis not present

## 2024-12-19 LAB — CBC
HCT: 25.1 % — ABNORMAL LOW (ref 39.0–52.0)
Hemoglobin: 8.4 g/dL — ABNORMAL LOW (ref 13.0–17.0)
MCH: 30.4 pg (ref 26.0–34.0)
MCHC: 33.5 g/dL (ref 30.0–36.0)
MCV: 90.9 fL (ref 80.0–100.0)
Platelets: 224 K/uL (ref 150–400)
RBC: 2.76 MIL/uL — ABNORMAL LOW (ref 4.22–5.81)
RDW: 14.6 % (ref 11.5–15.5)
WBC: 10.7 K/uL — ABNORMAL HIGH (ref 4.0–10.5)
nRBC: 0 % (ref 0.0–0.2)

## 2024-12-19 LAB — GLUCOSE, CAPILLARY
Glucose-Capillary: 152 mg/dL — ABNORMAL HIGH (ref 70–99)
Glucose-Capillary: 178 mg/dL — ABNORMAL HIGH (ref 70–99)
Glucose-Capillary: 123 mg/dL — ABNORMAL HIGH (ref 70–99)
Glucose-Capillary: 124 mg/dL — ABNORMAL HIGH (ref 70–99)
Glucose-Capillary: 168 mg/dL — ABNORMAL HIGH (ref 70–99)
Glucose-Capillary: 175 mg/dL — ABNORMAL HIGH (ref 70–99)

## 2024-12-19 LAB — RENAL FUNCTION PANEL
Albumin: 3.1 g/dL — ABNORMAL LOW (ref 3.5–5.0)
Anion gap: 13 (ref 5–15)
BUN: 93 mg/dL — ABNORMAL HIGH (ref 8–23)
CO2: 26 mmol/L (ref 22–32)
Calcium: 8.9 mg/dL (ref 8.9–10.3)
Chloride: 97 mmol/L — ABNORMAL LOW (ref 98–111)
Creatinine, Ser: 3.34 mg/dL — ABNORMAL HIGH (ref 0.61–1.24)
GFR, Estimated: 18 mL/min — ABNORMAL LOW
Glucose, Bld: 173 mg/dL — ABNORMAL HIGH (ref 70–99)
Phosphorus: 3.9 mg/dL (ref 2.5–4.6)
Potassium: 3.9 mmol/L (ref 3.5–5.1)
Sodium: 136 mmol/L (ref 135–145)

## 2024-12-19 LAB — MAGNESIUM: Magnesium: 2.6 mg/dL — ABNORMAL HIGH (ref 1.7–2.4)

## 2024-12-19 MED ORDER — GABAPENTIN 250 MG/5ML PO SOLN
100.0000 mg | Freq: Every day | ORAL | Status: DC
  Administered 2024-12-21 – 2024-12-24 (×5): 100 mg
  Filled 2024-12-19 (×6): qty 2

## 2024-12-19 MED ORDER — MIDODRINE HCL 5 MG PO TABS
5.0000 mg | ORAL_TABLET | Freq: Three times a day (TID) | ORAL | Status: DC
  Administered 2024-12-19 – 2024-12-25 (×19): 5 mg
  Filled 2024-12-19 (×19): qty 1

## 2024-12-19 NOTE — Progress Notes (Signed)
 Occupational Therapy Treatment Patient Details Name: Shane [name] MRN: [id number] DOB: [birth year] Today's Date: 12/19/2024   History of present illness Shane [name] is an 84 year old male with ischemic ulcers who presents with progressive painful ulceration of the left foot and now s/p L BKA on 1/7.Pt with hypotension 1/8 and transferred to ICU. CRRT started 1/10. PHMx: idiopathic pulmonary fibrosis, OSA, HTN, DM2, CKD, Afib, CHF, CABG, R BKA 2022.   OT comments  This 84 yo male seen today in conjunction with PT to see if we could get him to sit EOB and work on sitting balance. He assisted with moving his legs towards EOB and use of Bil UEs to help come towards EOB with cues, but then still needed Max A +2 to come up to sit EOB. He then varied from CGA to total A with c/o of mild dizziness the entire time. (BP did drop).He tolerated ~20 minutes of sitting EOB and once laid back down the was worn out. He will continue to benefit from acute OT with follow up from intensive inpatient follow-up therapy, >3 hours/day; however if endurance for activity does not increase continued inpatient follow up therapy, <3 hours/day may be more appropriate.        If plan is discharge home, recommend the following:  Two people to help with walking and/or transfers;Two people to help with bathing/dressing/bathroom;Assistance with cooking/housework;Assistance with feeding;Direct supervision/assist for medications management;Direct supervision/assist for financial management;Assist for transportation;Help with stairs or ramp for entrance   Equipment Recommendations  Other (comment) (TBD next venue)    Recommendations for Other Services Rehab consult    Precautions / Restrictions Precautions Precautions: Fall Recall of Precautions/Restrictions: Intact Precaution/Restrictions Comments: watch BP Restrictions Weight Bearing Restrictions Per Provider Order: No LLE Weight Bearing Per Provider Order:  Non weight bearing       Mobility Bed Mobility Overal bed mobility: Needs Assistance Bed Mobility: Supine to Sit     Supine to sit: +2 for physical assistance, HOB elevated, Max assist     General bed mobility comments: Had pt move his residual limbs to the right, cued to place RUE in bed rail (he was able to shift his shoulders around to get his hand in rail), then cued to reach across with LUE for same bed rail which he was able to do. At this point pt was total A +2 for use of bedpad to spin around to come up to sit EOB       Balance Overall balance assessment: Needs assistance Sitting-balance support: Single extremity supported, Bilateral upper extremity supported, No upper extremity supported   Sitting balance - Comments: varied from brief periods of fair (with single or double UE support) to zero with same support. Worked on pt finding his center balance point at EOB and maintaining this.                                   ADL either performed or assessed with clinical judgement   ADL                                         General ADL Comments: Working on bed mobility,  sitting EOB balance today as precursor to basic ADLs, and transfers and following commands    Extremity/Trunk Assessment Upper Extremity Assessment Upper  Extremity Assessment: Generalized weakness RUE Coordination: decreased fine motor;decreased gross motor LUE Coordination: decreased fine motor;decreased gross motor            Vision Patient Visual Report: No change from baseline           Communication Communication Communication: Impaired Factors Affecting Communication: Difficulty expressing self;Reduced clarity of speech   Cognition Arousal: Lethargic, Alert (alert to begin and became more lethargic as progressed with acivity) Behavior During Therapy: Flat affect Cognition: Cognition impaired     Awareness: Intellectual awareness impaired, Online  awareness impaired   Attention impairment (select first level of impairment): Sustained attention Executive functioning impairment (select all impairments): Initiation, Organization, Sequencing, Reasoning, Problem solving                   Following commands: Impaired Following commands impaired: Follows one step commands with increased time, Follows one step commands inconsistently      Cueing   Cueing Techniques: Verbal cues, Tactile cues             Pertinent Vitals/ Pain       Pain Assessment Pain Assessment: No/denies pain Faces Pain Scale: No hurt         Frequency  Min 2X/week        Progress Toward Goals  OT Goals(current goals can now be found in the care plan section)  Progress towards OT goals: Not progressing toward goals - comment (lethagy, drop in BPs, decreased endurance for activity)  Acute Rehab OT Goals Patient Stated Goal: family still wants AIR OT Goal Formulation: With family Time For Goal Achievement: 12/27/24 Potential to Achieve Goals: Fair  Plan      Co-evaluation    PT/OT/SLP Co-Evaluation/Treatment: Yes Reason for Co-Treatment: For patient/therapist safety;Necessary to address cognition/behavior during functional activity PT goals addressed during session: Mobility/safety with mobility;Strengthening/ROM OT goals addressed during session: Strengthening/ROM      AM-PAC OT 6 Clicks Daily Activity     Outcome Measure   Help from another person eating meals?: Total Help from another person taking care of personal grooming?: Total Help from another person toileting, which includes using toliet, bedpan, or urinal?: Total Help from another person bathing (including washing, rinsing, drying)?: Total Help from another person to put on and taking off regular upper body clothing?: Total Help from another person to put on and taking off regular lower body clothing?: Total 6 Click Score: 6    End of Session    OT Visit  Diagnosis: Other abnormalities of gait and mobility (R26.89);Muscle weakness (generalized) (M62.81);Other symptoms and signs involving cognitive function;Dizziness and giddiness (R42)   Activity Tolerance  (pt doing well/alert, then c/o dizziness-BP's started to drop with last one in sitting rising (see flow sheet/PT note), laid pt down and BP's dropped again then slowly came back up. Pt very lethargic/drowsy at this point. All VSS, asked RN to check sugar)   Patient Left in bed;with call bell/phone within reach;with bed alarm set;with family/visitor present   Nurse Communication  (could blood sugar please be checked due to lethargy at end of OT/PT session (called front desk to ask and talked to RN in person--NT was going to go check ))        Time: 8696-8665 OT Time Calculation (min): 31 min  Charges: OT General Charges $OT Visit: 1 Visit OT Treatments $Therapeutic Activity: 8-22 mins  Donny BECKER OT Acute Rehabilitation Services Office [phone]   Rodgers Dorothyann Distel 12/19/2024, 3:46 PM

## 2024-12-19 NOTE — Evaluation (Signed)
 Modified Barium Swallow Study  Patient Details  Name: Shane [name] MRN: [id number] Date of Birth: March 08, 1941  Today's Date: 12/19/2024  Modified Barium Swallow completed.  Full report located under Chart Review in the Imaging Section.  History of Present Illness 83 yo male admitted 1/7 for L transtibial amputation. Transferred to ICU 1/8 for hypotension. CRRT started 1/10. Intermittent dialysis started 1/14. Seen by SLP 12/2020 and 12/2023 with concern for primary esophageal dysphagia, complaining of difficulty with solids>liquids and episodes of regurgitation. Declined an MBS and although recommended, esophageal assessment was not been completed. PMH: CKD III, DM, HTN, HLD, IPF, OSA on CPAP, HFrEF with LVEF 30-35%   Clinical Impression Pt has an oropharyngeal dysphagia that is likely exacerbated by mentation. He is not yet ready for a PO diet. Would continue using temporary, alternative means of nutrition with ongoing time to rebuild strength and allow for mentation to improve.   Pt is more lethargic this afternoon compared to this morning, still waking up for PO trials though. He has minimal oral residue that he typically clears with a second swallow. Pharyngeally he has reduced base of tongue retraction and pharyngeal squeeze, contributing to incomplete epiglottic inversion (almost no inversion with smaller, lighter boluses) and vallecular residue. Presence of cervical hardware may also contribute from a structural standpoint. Pt has incomplete laryngeal vestibule closure and deep penetration to the vocal folds occurs with thin liquids, as well as more shallow penetration with thicker liquids. Penetration also occurs after the swallow with thicker liquids from residuals, also likely to have mixed with residue from solid trials. Across the course of the study pt inconsistently coughs and clears his throat but can never clear the penetrates, and they ultimately spill below his vocal folds to become  aspiration.   Factors that may increase risk of adverse event in presence of aspiration Shane [name] & Shane [name] 2021): Respiratory or GI disease;Reduced cognitive function;Limited mobility;Frail or deconditioned;Weak cough;Aspiration of thick, dense, and/or acidic materials  Swallow Evaluation Recommendations Recommendations: NPO;Alternative means of nutrition - NG Tube;Ice chips PRN after oral care (limited amounts of ice chips after oral care) Medication Administration: Via alternative means Oral care recommendations: Oral care QID (4x/day);Oral care before ice chips/water Caregiver Recommendations: Have oral suction available      Shane [name]., M.A. CCC-SLP Acute Rehabilitation Services Office: [phone]  Secure chat preferred  12/19/2024,4:25 PM

## 2024-12-19 NOTE — Progress Notes (Signed)
 Admit: 12/11/2024 LOS: 8   71M dialysis dependent AKI on CKD 3, status post left BKA, resolving septic shock  Subjective:  HD yesterday with 2 L UF, 0.5 L [name] in the past 24 hours Continues to have sundowning/delirium  01/14 0701 - 01/15 0700 In: 1887.5 [NG/GT:1787.5; IV Piggyback:100] Out: 2550 [Urine:550]  Filed Weights   12/18/24 0500 12/18/24 1403 12/19/24 0407  Weight: 102.2 kg 100.3 kg 108.1 kg    Scheduled Meds:  acidophilus  1 capsule Oral Daily   apixaban   2.5 mg Per Tube BID   vitamin C   1,000 mg Per Tube Daily   atorvastatin   80 mg Per Tube Daily   Chlorhexidine  Gluconate Cloth  6 each Topical Q0600   feeding supplement (PROSource TF20)  60 mL Per Tube TID   ferrous sulfate   300 mg Per Tube Q breakfast   gabapentin   100 mg Per Tube Q8H   insulin  aspart  0-15 Units Subcutaneous Q4H   insulin  aspart  10 Units Subcutaneous Q4H   insulin  glargine  22 Units Subcutaneous BID   loperamide  HCl  4 mg Per Tube TID   melatonin  3 mg Oral QHS   midodrine   5 mg Per Tube Q8H   multivitamin with minerals  1 tablet Per Tube q AM   nutrition supplement (JUVEN)  1 packet Per Tube BID BM   mouth rinse  15 mL Mouth Rinse 4 times per day   pantoprazole  (PROTONIX ) IV  40 mg Intravenous BID   sodium chloride  flush  3 mL Intravenous Q12H   Continuous Infusions:  feeding supplement (KATE FARMS STANDARD 1.4) Liquid 1,000 mL (12/19/24 0933)   piperacillin -tazobactam (ZOSYN )  IV 2.25 g (12/19/24 0509)   PRN Meds:.acetaminophen , albuterol , dextromethorphan , EPINEPHrine , fentaNYL  (SUBLIMAZE ) injection, hydrOXYzine , loperamide  HCl **FOLLOWED BY** loperamide  HCl, methocarbamol , mupirocin  ointment, olopatadine , ondansetron , mouth rinse, oxyCODONE , phenol, sodium chloride  flush, traZODone   Current Labs: reviewed    Physical Exam:  Blood pressure (!) 142/56, pulse 66, temperature 97.9 F (36.6 C), temperature source Oral, resp. rate (!) 21, height 6' (1.829 m), weight 108.1 kg, SpO2  99%. 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 Morphine should not be used.  Baclofen should be avoided Avoid oral sodium phosphate  and magnesium  citrate based laxatives / bowel preps    Bernardino Gasman MD 12/19/2024, 11:02 AM  Recent Labs  Lab 12/17/24 0432 12/18/24 0515 12/19/24 0457  NA 136 135 136  K 4.2 4.1 3.9  CL 100 98 97*  CO2 27 24 26   GLUCOSE 205* 204* 173*  BUN 57* 98* 93*  CREATININE 2.17* 3.65* 3.34*  CALCIUM  8.9 9.0 8.9  PHOS 2.9 3.1 3.9   Recent Labs  Lab 12/17/24 0432 12/18/24 0515 12/19/24 0457  WBC 9.4 9.6 10.7*  HGB 8.6* 8.5* 8.4*  HCT  25.9* 24.9* 25.1*  MCV 90.9 89.2 90.9  PLT 183 193 224

## 2024-12-19 NOTE — Progress Notes (Signed)
 Physical Therapy Treatment Patient Details Name: Shane [name] MRN: [id number] DOB: 07-08-1941 Today's Date: 12/19/2024   History of Present Illness Shane [name] is an 84 year old male with ischemic ulcers who presents with progressive painful ulceration of the left foot and now s/p L BKA on 1/7.Pt with hypotension 1/8 and transferred to ICU. CRRT started 1/10. PHMx: idiopathic pulmonary fibrosis, OSA, HTN, DM2, CKD, Afib, CHF, CABG, R BKA 2022.    PT Comments  Pt able to progress mobility to sitting EOB this session. Pt required maximal physical assistance of 2 for bed mobility, and then participated in seated balance training at EOB. Pt able to tolerate sitting EOB for short bouts of 30-40s with single UE support and cues for head movement to assist with keeping COG over BOS, for about 20 mins. Pt then needing to lay down due to worsening dizziness; RN aware. Pt and pt's wife were encouraged to work on extending L knee and educated on the negative effects of having leg propped up on rolled pillows and towels as upon entry pt had two towel rolls under each knee. PT will continue to treat pt while he is admitted. Patient will benefit from intensive inpatient follow-up therapy, >3 hours/day.   Supine BP: 133/66 sitting EOB BP: 106/57 sitting for 3 mins with UE exercises BP: 102/58 sitting for 7 mins BP: 119/61 supine with HOB elevated BP: 102/55 supine with HOB elevated after 3 mins BP: 100/56      If plan is discharge home, recommend the following: Two people to help with walking and/or transfers;Two people to help with bathing/dressing/bathroom;Assistance with cooking/housework;Assist for transportation;Help with stairs or ramp for entrance   Can travel by private vehicle        Equipment Recommendations  Hospital bed;Hoyer lift    Recommendations for Other Services Rehab consult     Precautions / Restrictions Precautions Precautions: Fall Recall of  Precautions/Restrictions: Intact Precaution/Restrictions Comments: watch BP Restrictions Weight Bearing Restrictions Per Provider Order: Yes LLE Weight Bearing Per Provider Order: Non weight bearing     Mobility  Bed Mobility Overal bed mobility: Needs Assistance Bed Mobility: Supine to Sit, Sit to Supine     Supine to sit: Max assist, +2 for physical assistance, HOB elevated Sit to supine: Max assist, +2 for physical assistance   General bed mobility comments: With cueing for step by step sequencing, pt was able to slowly advance BLEs towards EOB, and then used RUE on R bed railing as well as LUE via reaching across to assist with pulling self towards EOB. Pt then required max A +2 for obtaining upright position. Increased time to complete.    Transfers                   General transfer comment: deferred at this time    Ambulation/Gait                   Stairs             Wheelchair Mobility     Tilt Bed    Modified Rankin (Stroke Patients Only)       Balance Overall balance assessment: Needs assistance Sitting-balance support: Single extremity supported, Feet unsupported Sitting balance-Leahy Scale: Poor Sitting balance - Comments: Without BUE support, pt requires moderate physical assistance for maintaining upright. Pt was able to sustain seated upright with single RUE supported on bed with LUE in lap for ~30s with cues for minimal head movement. Pt  then able to sit upright single LUE supported on bed rail at end of bed and RUE in lap, for ~40s with cues for minimal head movement to assist with finding balance. In total, pt tolerated sitting EOB for ~20 mins. Postural control: Posterior lean                                  Communication Communication Communication: Impaired Factors Affecting Communication: Difficulty expressing self;Reduced clarity of speech  Cognition Arousal: Lethargic Behavior During Therapy: Flat  affect   PT - Cognitive impairments: Awareness, Initiation, Sequencing                       PT - Cognition Comments: Pt alert upon entering room  but then throughout session progressively more and more lethargic, requiring tactile stimulation for remaining awake. Following commands: Impaired Following commands impaired: Follows one step commands with increased time    Cueing Cueing Techniques: Verbal cues, Tactile cues, Gestural cues  Exercises General Exercises - Upper Extremity Shoulder Flexion: AROM, Both, 5 reps Shoulder Horizontal ABduction: AROM, Both, 5 reps Elbow Flexion: AROM, Both, 5 reps Other Exercises Other Exercises: serratus punch 2:1, 1x5    General Comments General comments (skin integrity, edema, etc.): Supine BP: 133/66, sitting EOB BP: 106/57, sitting for 3 mins with UE exercises BP: 102/58, sitting for 7 mins BP: 119/61, supine with HOB elevated BP: 102/55, supine with HOB elevated after 3 mins BP: 100/56      Pertinent Vitals/Pain Pain Assessment Pain Assessment: No/denies pain Faces Pain Scale: No hurt Pain Intervention(s): Limited activity within patient's tolerance, Monitored during session    Home Living                          Prior Function            PT Goals (current goals can now be found in the care plan section) Acute Rehab PT Goals Patient Stated Goal: improve mobility PT Goal Formulation: With patient/family Time For Goal Achievement: 12/27/24 Potential to Achieve Goals: Fair Progress towards PT goals: Progressing toward goals    Frequency    Min 3X/week      PT Plan      Co-evaluation   Reason for Co-Treatment: For patient/therapist safety;Necessary to address cognition/behavior during functional activity PT goals addressed during session: Mobility/safety with mobility;Strengthening/ROM OT goals addressed during session: Strengthening/ROM      AM-PAC PT 6 Clicks Mobility   Outcome Measure  Help  needed turning from your back to your side while in a flat bed without using bedrails?: A Lot Help needed moving from lying on your back to sitting on the side of a flat bed without using bedrails?: Total Help needed moving to and from a bed to a chair (including a wheelchair)?: Total Help needed standing up from a chair using your arms (e.g., wheelchair or bedside chair)?: Total Help needed to walk in hospital room?: Total Help needed climbing 3-5 steps with a railing? : Total 6 Click Score: 7    End of Session Equipment Utilized During Treatment: Oxygen Activity Tolerance: Other (comment) (dizziness) Patient left: in bed;with call bell/phone within reach;with bed alarm set;with family/visitor present Nurse Communication: Mobility status;Need for lift equipment;Other (comment) (vitals) PT Visit Diagnosis: Other abnormalities of gait and mobility (R26.89)     Time: 8696-8665 PT Time Calculation (min) (ACUTE ONLY): 31 min  Charges:    $Therapeutic Activity: 8-22 mins PT General Charges $$ ACUTE PT VISIT: 1 Visit                     Leontine Hilt DPT Acute Rehab Services [phone] Prefer contact via chat    Leontine NOVAK Gerhard Rappaport 12/19/2024, 4:04 PM

## 2024-12-19 NOTE — TOC Progression Note (Signed)
 Transition of Care [facility]) - Progression Note    Patient Details  Name: Shane [name] MRN: [id number] Date of Birth: [birth year]  [facility] CM/SW Contact  Graves-Bigelow, Erminio Deems, RN Phone Number: 12/19/2024, 11:45 AM  Clinical Narrative: Patient was dicussed in progression rounds. Patient is a transfer from [facility] and [facility]. PT/OT recommendations are for CIR-Inpatient Rehab Admissions Coordinator is following for medical stabilization. ICM will continue to follow for disposition needs as the patient progresses.   Expected Discharge Plan: IP Rehab Facility Barriers to Discharge: Continued Medical Work up  Expected Discharge Plan and Services   Discharge Planning Services: CM Consult Post Acute Care Choice: IP Rehab Living arrangements for the past 2 months: Single Family Home   HH Arranged: NA Social Drivers of Health (SDOH) Interventions SDOH Screenings   Food Insecurity: No Food Insecurity (12/11/2024)  Housing: Low Risk (12/11/2024)  Transportation Needs: No Transportation Needs (12/11/2024)  Utilities: Not At Risk (12/11/2024)  Financial Resource Strain: Low Risk  (08/22/2023)   Received from [facility]  Social Connections: Moderately Integrated (12/18/2024)  Tobacco Use: Low Risk (12/04/2024)   Readmission Risk Interventions    12/15/2023    2:29 PM  Readmission Risk Prevention Plan  Transportation Screening Complete  Medication Review (RN Care Manager) Complete  PCP or Specialist appointment within 3-5 days of discharge Complete  HRI or Home Care Consult Complete  SW Recovery Care/Counseling Consult Complete  Palliative Care Screening Complete  Skilled Nursing Facility Not Applicable

## 2024-12-19 NOTE — Plan of Care (Signed)
 " Problem: Education: Goal: Knowledge of the prescribed therapeutic regimen will improve Outcome: Progressing   Problem: Bowel/Gastric: Goal: Gastrointestinal status for postoperative course will improve Outcome: Progressing   Problem: Cardiac: Goal: Ability to maintain an adequate cardiac output Outcome: Progressing Goal: Will show no evidence of cardiac arrhythmias Outcome: Progressing   Problem: Nutritional: Goal: Will attain and maintain optimal nutritional status Outcome: Progressing   Problem: Neurological: Goal: Will regain or maintain usual level of consciousness Outcome: Progressing   Problem: Clinical Measurements: Goal: Ability to maintain clinical measurements within normal limits Outcome: Progressing Goal: Postoperative complications will be avoided or minimized Outcome: Progressing   Problem: Respiratory: Goal: Will regain and/or maintain adequate ventilation Outcome: Progressing Goal: Respiratory status will improve Outcome: Progressing   Problem: Skin Integrity: Goal: Demonstrates signs of wound healing without infection Outcome: Progressing   Problem: Urinary Elimination: Goal: Will remain free from infection Outcome: Progressing Goal: Ability to achieve and maintain adequate urine output Outcome: Progressing   Problem: Education: Goal: Knowledge of General Education information will improve Description: Including pain rating scale, medication(s)/side effects and non-pharmacologic comfort measures Outcome: Progressing   Problem: Health Behavior/Discharge Planning: Goal: Ability to manage health-related needs will improve Outcome: Progressing   Problem: Clinical Measurements: Goal: Ability to maintain clinical measurements within normal limits will improve Outcome: Progressing Goal: Will remain free from infection Outcome: Progressing Goal: Diagnostic test results will improve Outcome: Progressing Goal: Respiratory complications will  improve Outcome: Progressing Goal: Cardiovascular complication will be avoided Outcome: Progressing   Problem: Activity: Goal: Risk for activity intolerance will decrease Outcome: Progressing   Problem: Nutrition: Goal: Adequate nutrition will be maintained Outcome: Progressing   Problem: Coping: Goal: Level of anxiety will decrease Outcome: Progressing   Problem: Elimination: Goal: Will not experience complications related to bowel motility Outcome: Progressing Goal: Will not experience complications related to urinary retention Outcome: Progressing   Problem: Pain Managment: Goal: General experience of comfort will improve and/or be controlled Outcome: Progressing   Problem: Safety: Goal: Ability to remain free from injury will improve Outcome: Progressing   Problem: Skin Integrity: Goal: Risk for impaired skin integrity will decrease Outcome: Progressing   Problem: Education: Goal: Ability to describe self-care measures that may prevent or decrease complications (Diabetes Survival Skills Education) will improve Outcome: Progressing Goal: Individualized Educational Video(s) Outcome: Progressing   Problem: Coping: Goal: Ability to adjust to condition or change in health will improve Outcome: Progressing   Problem: Fluid Volume: Goal: Ability to maintain a balanced intake and output will improve Outcome: Progressing   Problem: Health Behavior/Discharge Planning: Goal: Ability to identify and utilize available resources and services will improve Outcome: Progressing Goal: Ability to manage health-related needs will improve Outcome: Progressing   Problem: Metabolic: Goal: Ability to maintain appropriate glucose levels will improve Outcome: Progressing   Problem: Nutritional: Goal: Maintenance of adequate nutrition will improve Outcome: Progressing Goal: Progress toward achieving an optimal weight will improve Outcome: Progressing   Problem: Skin  Integrity: Goal: Risk for impaired skin integrity will decrease Outcome: Progressing   Problem: Tissue Perfusion: Goal: Adequacy of tissue perfusion will improve Outcome: Progressing   Problem: Education: Goal: Knowledge of the prescribed therapeutic regimen will improve Outcome: Progressing Goal: Ability to verbalize activity precautions or restrictions will improve Outcome: Progressing Goal: Understanding of discharge needs will improve Outcome: Progressing   Problem: Activity: Goal: Ability to perform//tolerate increased activity and mobilize with assistive devices will improve Outcome: Progressing   Problem: Clinical Measurements: Goal: Postoperative complications will be avoided or minimized  Outcome: Progressing   Problem: Self-Care: Goal: Ability to meet self-care needs will improve Outcome: Progressing   "

## 2024-12-19 NOTE — Progress Notes (Signed)
 "  [facility] PROGRESS NOTE   Shane [name] FMW:[id number] DOB: 02-Dec-1941 DOA: 12/11/2024  PCP: Shepard Ade, MD  Brief History: 84 year old male with past medical history significant for chronic kidney disease stage III, diabetes, hypertension, hyperlipidemia, IPF, OSA on CPAP and at bedtime 2L Meta, and HFrEF with LVEF 30 to 35%. He presented to [facility] 1/7 after failing conservative outpatient therapy for ischemic lower extremity ulcers and left foot with gangrene. He underwent transtibial amputation and was subsequently sent to the medical floor for recovery. In the early a.m. hours 1/8 he became hypotensive which did improve temporarily with crystalloid bolus, however, he again became hypotensive with systolic blood pressures in the 70s and 80s.  He was subsequently transferred to the ICU.  He was stabilized.  He was also noted to be in acute kidney injury.  Nephrology was consulted.  He underwent CRRT.  Stabilized and then transferred to the floor.  Consultants: Critical care medicine.  Nephrology.  Orthopedics.  Procedures: Left transtibial amputation 1/7   Subjective/Interval History: Patient noted to be little bit more awake this morning more communicative but still tends to go back to sleep.  Wife is at the bedside.   Patient denies any pain at this time.  Assessment/Plan:  Septic shock Secondary to left foot gangrene.  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 Patient was in the ICU.  Required pressors.  Patient was stabilized. Patient was started on midodrine .  Blood pressures have improved with occasional high readings.  Noted to be on midodrine  10 mg every 8 hours.  Will decrease to 5 mg every 8 hours and monitor. Patient will complete course of Zosyn  today.  He was also on linezolid  previously.  Acute kidney injury on chronic kidney disease stage IIIb Patient had worsening in his renal function.  Nephrology was consulted.  Patient was started on  CRRT.  Plan is for intermittent hemodialysis going forward.  He was dialyzed yesterday.  Further management per nephrology.  Acute metabolic encephalopathy/hospital delirium Remains encephalopathic though appears to show some improvement.  Continue to engage with patient on a frequent basis.  No focal neurological deficits noted.    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  Continue SSI and glargine. HbA1c 7.6.  Chronic systolic CHF EF is 30 to 35% based on echocardiogram done on 12/12/2024. Cardiac status stable for the most part.  Volume being managed with hemodialysis.  Paroxysmal atrial fibrillation Eliquis  being continued.  Currently not on any rate limiting drugs due to his recent septic shock.  He is requiring midodrine  for blood pressure support.  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  Underwent left transtibial amputation during this hospitalization.  Orthopedics is following.  Normocytic anemia Likely anemia of chronic disease.  No evidence of overt bleeding.  Hemoglobin remains stable.  No need to check on a daily basis.  Oropharyngeal dysphagia/moderate protein calorie malnutrition Has a cortrak feeding tube.  Speech therapy is following.  Hopefully as his mentation improves we will be able to start him on oral diet.  History of sleep apnea Resume CPAP once feeding tube has been discontinued.  DVT Prophylaxis: On apixaban  Code Status: Full code Family Communication: Discussed with his wife Disposition Plan: CIR is being considered    Medications: Scheduled:  acidophilus  1 capsule Oral Daily   apixaban   2.5 mg Per Tube BID   vitamin C   1,000 mg Per  Tube Daily   atorvastatin   80 mg Per Tube Daily   Chlorhexidine  Gluconate Cloth  6 each Topical Q0600   feeding supplement (PROSource TF20)  60 mL Per Tube TID   ferrous sulfate   300 mg Per Tube Q breakfast   gabapentin   100 mg  Per Tube Q8H   insulin  aspart  0-15 Units Subcutaneous Q4H   insulin  aspart  10 Units Subcutaneous Q4H   insulin  glargine  22 Units Subcutaneous BID   loperamide  HCl  4 mg Per Tube TID   melatonin  3 mg Oral QHS   midodrine   10 mg Per Tube Q8H   multivitamin with minerals  1 tablet Per Tube q AM   nutrition supplement (JUVEN)  1 packet Per Tube BID BM   mouth rinse  15 mL Mouth Rinse 4 times per day   pantoprazole  (PROTONIX ) IV  40 mg Intravenous BID   sodium chloride  flush  3 mL Intravenous Q12H   Continuous:  feeding supplement (KATE FARMS STANDARD 1.4) Liquid 1,000 mL (12/19/24 0933)   piperacillin -tazobactam (ZOSYN )  IV 2.25 g (12/19/24 0509)   PRN:acetaminophen , albuterol , dextromethorphan , EPINEPHrine , fentaNYL  (SUBLIMAZE ) injection, hydrOXYzine , loperamide  HCl **FOLLOWED BY** loperamide  HCl, methocarbamol , mupirocin  ointment, olopatadine , ondansetron , mouth rinse, oxyCODONE , phenol, sodium chloride  flush, traZODone   Antibiotics: Anti-infectives (From admission, onward)    Start     Dose/Rate Route Frequency Ordered Stop   12/18/24 0600  piperacillin -tazobactam (ZOSYN ) IVPB 2.25 g        2.25 g 100 mL/hr over 30 Minutes Intravenous Every 8 hours 12/17/24 2149 12/20/24 0559   12/17/24 2030  piperacillin -tazobactam (ZOSYN ) IVPB 2.25 g  Status:  Discontinued        2.25 g 100 mL/hr over 30 Minutes Intravenous Every 6 hours 12/17/24 1738 12/17/24 2149   12/17/24 1430  piperacillin -tazobactam (ZOSYN ) IVPB 2.25 g  Status:  Discontinued        2.25 g 100 mL/hr over 30 Minutes Intravenous Every 6 hours 12/17/24 1335 12/17/24 1738   12/17/24 1400  piperacillin -tazobactam (ZOSYN ) IVPB 3.375 g  Status:  Discontinued        3.375 g 100 mL/hr over 30 Minutes Intravenous Every 8 hours 12/17/24 1010 12/17/24 1335   12/15/24 1430  linezolid  (ZYVOX ) IVPB 600 mg  Status:  Discontinued        600 mg 300 mL/hr over 60 Minutes Intravenous Every 12 hours 12/15/24 1343 12/17/24 1002   12/14/24  1800  piperacillin -tazobactam (ZOSYN ) IVPB 3.375 g  Status:  Discontinued        3.375 g 100 mL/hr over 30 Minutes Intravenous Every 6 hours 12/14/24 1350 12/17/24 1010   12/14/24 0945  vancomycin  (VANCOREADY) IVPB 1750 mg/350 mL        1,750 mg 175 mL/hr over 120 Minutes Intravenous  Once 12/14/24 0846 12/14/24 1250   12/13/24 1115  vancomycin  variable dose per unstable renal function (pharmacist dosing)  Status:  Discontinued         Does not apply See admin instructions 12/13/24 1115 12/15/24 1343   12/13/24 1115  vancomycin  (VANCOREADY) IVPB 2000 mg/400 mL        2,000 mg 200 mL/hr over 120 Minutes Intravenous  Once 12/13/24 1025 12/13/24 1533   12/13/24 1100  piperacillin -tazobactam (ZOSYN ) IVPB 3.375 g  Status:  Discontinued        3.375 g 12.5 mL/hr over 240 Minutes Intravenous Every 8 hours 12/13/24 1025 12/14/24 1350   12/11/24 0700  ceFAZolin  (ANCEF ) IVPB 2g/100 mL premix  2 g 200 mL/hr over 30 Minutes Intravenous On call to O.R. 12/11/24 0653 12/11/24 0843       Objective:  Vital Signs  Vitals:   12/18/24 2007 12/18/24 2327 12/19/24 0407 12/19/24 0810  BP: (!) 157/62 (!) 128/59 (!) 137/56 (!) 142/56  Pulse: 66 84 66 66  Resp: 18 18 18  (!) 21  Temp: 98.3 F (36.8 C) 98.4 F (36.9 C) 97.6 F (36.4 C) 97.9 F (36.6 C)  TempSrc: Oral Oral Oral Oral  SpO2: 96% 94% 97% 99%  Weight:   108.1 kg   Height:        Intake/Output Summary (Last 24 hours) at 12/19/2024 1021 Last data filed at 12/19/2024 0345 Gross per 24 hour  Intake 1887.5 ml  Output 2550 ml  Net -662.5 ml   Filed Weights   12/18/24 0500 12/18/24 1403 12/19/24 0407  Weight: 102.2 kg 100.3 kg 108.1 kg   General appearance: Much more awake and alert today compared to yesterday but still somnolent. Resp: Clear to auscultation bilaterally.  Normal effort Cardio: S1-S2 is normal regular.  No S3-S4.  No rubs murmurs or bruit GI: Abdomen is soft.  Nontender nondistended.  Bowel sounds are present  normal.  No masses organomegaly Extremities: He is status post bilateral lower extremity amputations.  Lab Results:  Data Reviewed: I have personally reviewed following labs and reports of the imaging studies  CBC: Recent Labs  Lab 12/14/24 0225 12/16/24 0218 12/17/24 0432 12/18/24 0515 12/19/24 0457  WBC 16.7* 11.5* 9.4 9.6 10.7*  HGB 8.7* 8.4* 8.6* 8.5* 8.4*  HCT 26.6* 25.2* 25.9* 24.9* 25.1*  MCV 91.1 92.3 90.9 89.2 90.9  PLT 153 164 183 193 224    Basic Metabolic Panel: Recent Labs  Lab 12/15/24 0205 12/15/24 1619 12/16/24 0218 12/16/24 1553 12/17/24 0432 12/18/24 0515 12/19/24 0457  NA 128*   < > 134* 136 136 135 136  K 5.4*   < > 4.6 4.6 4.2 4.1 3.9  CL 93*   < > 97* 100 100 98 97*  CO2 24   < > 26 27 27 24 26   GLUCOSE 325*   < > 191* 203* 205* 204* 173*  BUN 69*   < > 50* 51* 57* 98* 93*  CREATININE 2.90*   < > 1.90* 1.70* 2.17* 3.65* 3.34*  CALCIUM  7.9*   < > 8.9 8.9 8.9 9.0 8.9  MG 2.4  --  2.3  --  2.5* 2.7* 2.6*  PHOS 2.7   < > 3.3 2.2* 2.9 3.1 3.9   < > = values in this interval not displayed.    GFR: Estimated Creatinine Clearance: 21.3 mL/min (A) (by C-G formula based on SCr of 3.34 mg/dL (H)).  Liver Function Tests: Recent Labs  Lab 12/16/24 0218 12/16/24 1553 12/17/24 0432 12/18/24 0515 12/19/24 0457  ALBUMIN  3.2* 3.1* 3.3* 3.2* 3.1*    CBG: Recent Labs  Lab 12/18/24 1703 12/18/24 2010 12/18/24 2332 12/19/24 0409 12/19/24 0808  GLUCAP 210* 181* 192* 152* 178*     Recent Results (from the past 240 hours)  MRSA Next Gen by PCR, Nasal     Status: None   Collection Time: 12/12/24  2:03 PM   Specimen: Nasal Mucosa; Nasal Swab  Result Value Ref Range Status   MRSA by PCR Next Gen NOT DETECTED NOT DETECTED Final    Comment: (NOTE) The GeneXpert MRSA Assay (FDA approved for NASAL specimens only), is one component of a comprehensive MRSA colonization surveillance program. It  is not intended to diagnose MRSA infection nor to  guide or monitor treatment for MRSA infections. Test performance is not FDA approved in patients less than 80 years old. Performed at [facility], [address], [place], [place] [address]   Culture, blood (Routine X 2) w Reflex to ID Panel     Status: None   Collection Time: 12/13/24 11:15 AM   Specimen: BLOOD  Result Value Ref Range Status   Specimen Description BLOOD SITE NOT SPECIFIED  Final   Special Requests   Final    BOTTLES DRAWN AEROBIC AND ANAEROBIC Blood Culture results may not be optimal due to an inadequate volume of blood received in culture bottles   Culture   Final    NO GROWTH 5 DAYS Performed at [facility], [address] [place], [place] [address]    Report Status 12/18/2024 FINAL  Final  Culture, blood (Routine X 2) w Reflex to ID Panel     Status: None   Collection Time: 12/13/24 11:15 AM   Specimen: BLOOD  Result Value Ref Range Status   Specimen Description BLOOD SITE NOT SPECIFIED  Final   Special Requests   Final    BOTTLES DRAWN AEROBIC AND ANAEROBIC Blood Culture adequate volume   Culture   Final    NO GROWTH 5 DAYS Performed at [facility], [address] [place], [place] [address]    Report Status 12/18/2024 FINAL  Final      Radiology Studies: No results found.     LOS: 8 days   Erik Burkett [employer] on [email or web address]  12/19/2024, 10:21 AM   "

## 2024-12-19 NOTE — Progress Notes (Signed)
 Speech Language Pathology Treatment: Dysphagia  Patient Details Name: Shane [name] MRN: [id number] DOB: 07/29/1941 Today's Date: 12/19/2024 Time: 9056-8996 SLP Time Calculation (min) (ACUTE ONLY): 20 min  Assessment / Plan / Recommendation Clinical Impression  Pt seen in upright position in bed today after nursing visit. Pt appeared alert and was cooperative, providing immediate responses to questions and cues. Pt demonstrated increasing fatigue as session progressed and fell asleep while SLP provided education. Wife at bedside for session and education on recommendations. Pt consumed PO trials of ice chips, thin liquid, and puree. Pt had immediate and delayed coughing after ice chips and thin liquids. Pt tolerated small and large spoonfuls of puree without overt s/s of aspiration. Pt initially required increased cueing for awareness of boluses, but independently demonstrated awareness during thin liquid via straw and purees.   PLAN: Pt making gradual progress toward PO diet. MBS to define swallow physiology and determine safest diet recommendation given h/o dysphagia symptoms and ongoing signs concerning for aspiration. Will complete as can be scheduled and alertness allows.    HPI HPI: 84 yo male admitted 1/7 for L transtibial amputation. Transferred to ICU 1/8 for hypotension. CRRT started 1/10. Intermittent dialysis started 1/14. Seen by SLP 12/2020 and 12/2023 with concern for primary esophageal dysphagia, complaining of difficulty with solids>liquids and episodes of regurgitation. Declined an MBS and although recommended, esophageal assessment was not been completed. PMH: CKD III, DM, HTN, HLD, IPF, OSA on CPAP, HFrEF with LVEF 30-35%      SLP Plan  MBS        Swallow Evaluation Recommendations   Recommendations: NPO;Ice chips PRN after oral care Medication Administration: Via alternative means Oral care recommendations: Oral care QID (4x/day) Caregiver Recommendations: Have oral  suction available     Recommendations                         Frequent or constant Supervision/Assistance Dysphagia, unspecified (R13.10)     MBS     Rocky Quan, Student SLP   12/19/2024, 10:36 AM

## 2024-12-20 DIAGNOSIS — E440 Moderate protein-calorie malnutrition: Secondary | ICD-10-CM | POA: Diagnosis not present

## 2024-12-20 DIAGNOSIS — G9340 Encephalopathy, unspecified: Secondary | ICD-10-CM | POA: Diagnosis not present

## 2024-12-20 DIAGNOSIS — N179 Acute kidney failure, unspecified: Secondary | ICD-10-CM | POA: Diagnosis not present

## 2024-12-20 DIAGNOSIS — Z89512 Acquired absence of left leg below knee: Secondary | ICD-10-CM | POA: Diagnosis not present

## 2024-12-20 DIAGNOSIS — I5022 Chronic systolic (congestive) heart failure: Secondary | ICD-10-CM | POA: Diagnosis not present

## 2024-12-20 LAB — GLUCOSE, CAPILLARY
Glucose-Capillary: 133 mg/dL — ABNORMAL HIGH (ref 70–99)
Glucose-Capillary: 137 mg/dL — ABNORMAL HIGH (ref 70–99)
Glucose-Capillary: 174 mg/dL — ABNORMAL HIGH (ref 70–99)
Glucose-Capillary: 176 mg/dL — ABNORMAL HIGH (ref 70–99)
Glucose-Capillary: 181 mg/dL — ABNORMAL HIGH (ref 70–99)

## 2024-12-20 LAB — RENAL FUNCTION PANEL
Albumin: 3.1 g/dL — ABNORMAL LOW (ref 3.5–5.0)
Anion gap: 12 (ref 5–15)
BUN: 118 mg/dL — ABNORMAL HIGH (ref 8–23)
CO2: 26 mmol/L (ref 22–32)
Calcium: 9 mg/dL (ref 8.9–10.3)
Chloride: 97 mmol/L — ABNORMAL LOW (ref 98–111)
Creatinine, Ser: 3.77 mg/dL — ABNORMAL HIGH (ref 0.61–1.24)
GFR, Estimated: 15 mL/min — ABNORMAL LOW
Glucose, Bld: 136 mg/dL — ABNORMAL HIGH (ref 70–99)
Phosphorus: 4.2 mg/dL (ref 2.5–4.6)
Potassium: 4.2 mmol/L (ref 3.5–5.1)
Sodium: 135 mmol/L (ref 135–145)

## 2024-12-20 LAB — MAGNESIUM: Magnesium: 2.6 mg/dL — ABNORMAL HIGH (ref 1.7–2.4)

## 2024-12-20 MED ORDER — HEPARIN SODIUM (PORCINE) 1000 UNIT/ML IJ SOLN
INTRAMUSCULAR | Status: AC
  Filled 2024-12-20: qty 2

## 2024-12-20 MED ORDER — PROSOURCE TF20 ENFIT COMPATIBL EN LIQD
60.0000 mL | Freq: Two times a day (BID) | ENTERAL | Status: DC
  Administered 2024-12-21 – 2024-12-24 (×9): 60 mL
  Filled 2024-12-20 (×9): qty 60

## 2024-12-20 MED ORDER — ORAL CARE MOUTH RINSE: 15.0000 mL | OROMUCOSAL | Status: DC | PRN

## 2024-12-20 MED ORDER — TAMSULOSIN HCL 0.4 MG PO CAPS
0.4000 mg | ORAL_CAPSULE | Freq: Every day | ORAL | Status: DC
  Filled 2024-12-20 (×2): qty 1

## 2024-12-20 MED ORDER — RAMELTEON 8 MG PO TABS
8.0000 mg | ORAL_TABLET | Freq: Every day | ORAL | Status: DC
  Administered 2024-12-21 – 2024-12-24 (×5): 8 mg via ORAL
  Filled 2024-12-20 (×6): qty 1

## 2024-12-20 MED ORDER — ORAL CARE MOUTH RINSE
15.0000 mL | OROMUCOSAL | Status: DC
  Administered 2024-12-20 – 2024-12-25 (×19): 15 mL via OROMUCOSAL

## 2024-12-20 NOTE — Progress Notes (Signed)
 Received patient in bed to unit.  Alert and oriented x1 Informed consent signed and in chart.   TX duration:3.05 hours  Patient had to be disconnected early. Getting much confusing started pulling lines. Pt redirected Transported back to the room  Alert, without acute distress.  Hand-off given to patient's nurse.   Access used: dialysis cath Access issues: nopne  Total UF removed: 1600 Medication(s) given: heplock 1.2 units per port Post HD VS: see table below Post HD weight: 107.2kg  12/20/24 2346  Vitals  BP (!) 128/94  MAP (mmHg) 100  BP Location Left Arm  BP Method Automatic  Patient Position (if appropriate) Lying  Pulse Rate 85  Pulse Rate Source Monitor  ECG Heart Rate 85  Resp 15  Weight 107.2 kg  Type of Weight Post-Dialysis  Oxygen Therapy  SpO2 96 %  O2 Device Nasal Cannula  O2 Flow Rate (L/min) 1 L/min  Patient Activity (if Appropriate) In bed  Pulse Oximetry Type Continuous  During Treatment Monitoring  Blood Flow Rate (mL/min) 0 mL/min  Arterial Pressure (mmHg) 20.8 mmHg  Venous Pressure (mmHg) 29.29 mmHg  TMP (mmHg) 21.21 mmHg  Ultrafiltration Rate (mL/min) 1168 mL/min  Dialysate Flow Rate (mL/min) 299 ml/min  Dialysate Potassium Concentration 3  Dialysate Calcium  Concentration 2.5  Duration of HD Treatment -hour(s) 3.09 hour(s)  Cumulative Fluid Removed (mL) per Treatment  1616.26  HD Safety Checks Performed Yes  Intra-Hemodialysis Comments See progress note  Post Treatment  Dialyzer Clearance Lightly streaked  Hemodialysis Intake (mL) 0 mL  Liters Processed 74  Fluid Removed (mL) 1600 mL  Tolerated HD Treatment No (Comment)  Post-Hemodialysis Comments goal not met  Hemodialysis Catheter Right Internal jugular  Placement Date/Time: 12/14/24 1300   Placed prior to admission: No  Time Out: Correct patient;Correct site;Correct procedure  Maximum sterile barrier precautions: Hand hygiene;Cap;Mask;Sterile gown;Sterile gloves;Large sterile  sheet;Sterile probe cove...  Site Condition No complications  Blue Lumen Status Flushed;Heparin  locked;Dead end cap in place  Red Lumen Status Flushed;Heparin  locked;Dead end cap in place  Purple Lumen Status Other (Comment)  Catheter fill solution Heparin  1000 units/ml  Catheter fill volume (Arterial) 1.2 cc  Catheter fill volume (Venous) 1.2      [name]

## 2024-12-20 NOTE — Consult Note (Signed)
 "     Physical Medicine and Rehabilitation Consult Reason for Consult: Evaluate appropriateness for Inpatient Rehab Referring Physician: Dr. Joette    HPI: Shane [name] is a 84 y.o. 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 . They were admitted to [facility] on 12/11/2024 for left ischemic foot ulceration with gangrene, with transmetatarsal amputation from 2023, with planned left lower extremity BKA with Dr. Trude.  On 1-8, he became hypotensive and subsequently transferred to ICU, required pressor support which has since been weaned off to midodrine , on course of linezolid  -> Zosyn  for sepsis which finished today.  Hospitalization initially complicated by AKI on CKD stage IIIb requiring CRRT with plan for dialysis moving forward, type 2 diabetes, CHF, paroxysmal A-fib, anemia, protein calorie malnutrition, encephalopathy/cognitive decline, dysphagia/aspiration, diarrhea, and urinary retention requiring Foley catheter. PM&R was consulted to evaluate appropriateness for IPR admission.   Patient is known to our service and was at inpatient rehab here in 2022 after BKA of the right lower extremity.  Currently, he has 24/7 physical assistance available with his spouse  and grandson at home, lives in a two-level home with a bedroom and bathroom on the first floor and a ramped entrance.  At baseline, he is modified independent with use of a scooter in the home and community, and transfers independently with intermittent use of right lower extremity prosthesis to assist.  He does not drive, and requires some assistance for lower body dressing and shower set up he at home.  He uses 2 L of oxygen intermittently at home. currently, he is sitting edge of bed for as long as 20 minutes with single upper extremity support, but is limited by dizziness and fatigue.  He is currently n.p.o. for diet due to lethargy and dysphagia.    Review of Systems  Constitutional:  Negative for chills and fever.  Eyes:  Positive for blurred vision and double vision.  Respiratory:  Positive for shortness of breath. Negative for cough.   Cardiovascular:  Negative for chest pain and palpitations.  Gastrointestinal:  Positive for diarrhea. Negative for nausea and vomiting.  Genitourinary:        Foley  Musculoskeletal:  Negative for joint pain and myalgias.  Neurological:  Positive for dizziness. Negative for weakness and headaches.  Psychiatric/Behavioral:  Positive for hallucinations and memory loss. The patient has insomnia.    Past Medical History:  Diagnosis Date   Acute blood loss anemia    Anxiety    AV block, Mobitz 1    Cataract    Chronic kidney disease    d/t DM   CKD (chronic kidney disease), stage III (HCC)    COVID-19 virus infection 07/18/2020   Last Assessment & Plan:   Formatting of this note might be different from the original.  Immunocompromise high risk patient  -ID consulted for further therapies and will administer regeneron as OP tomorrow   -Decadron  discontinued as patient is off O2  Depression    Diabetes mellitus    Vgo disposal insulin  bolus  simular to insulin  pump   Dyspnea    GERD (gastroesophageal reflux disease)    History of kidney stones     passed   Hyperlipidemia    Hypertension    Idiopathic pulmonary fibrosis (HCC) 11/2016   ILD (interstitial lung disease) (HCC)    Moderate aortic stenosis    a. 10/2019 Echo: EF 55-60%, Gr2 DD. Nl RV.    Neuromuscular disorder (HCC)    Neuropathy associated with endocrine disorder    Nonobstructive CAD (coronary artery disease)    a. 2012 Cath: mod, nonobs dzs; b. 10/2016 MV: EF 60%, no ischemia.   OSA on CPAP 05/05/2017   Unattended Home Sleep Test 7/2/813-AHI 38.6/hour, desaturation to 64%, body weight 261 pounds   PONV (postoperative nausea and vomiting)    Postoperative anemia due to acute blood loss 11/07/2020   Postoperative hemorrhagic shock 03/20/2021   Sleep apnea     uses cpap asked to bring mask and tubing   Past Surgical History:  Procedure Laterality Date   ABDOMINAL AORTOGRAM W/LOWER EXTREMITY N/A 12/10/2020   Procedure: ABDOMINAL AORTOGRAM W/LOWER EXTREMITY;  Surgeon: Magda Debby SAILOR, MD;  Location: [facility] INVASIVE CV LAB;  Service: Cardiovascular;  Laterality: N/A;   AMPUTATION Right 01/22/2021   Procedure: 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;  Surgeon: Harden Jerona GAILS, MD;  Location: [facility] OR;  Service: Orthopedics;  Laterality: Right;   AMPUTATION Right 03/17/2021   Procedure: RIGHT BELOW KNEE AMPUTATION;  Surgeon: Harden Jerona GAILS, MD;  Location: [facility] OR;  Service: Orthopedics;  Laterality: Right;   AMPUTATION Left 12/11/2024   Procedure: AMPUTATION BELOW KNEE;  Surgeon: Harden Jerona GAILS, MD;  Location: [facility] OR;  Service: Orthopedics;  Laterality: Left;   ANKLE FUSION Right 01/22/2021   Procedure: RIGHT FOOT TIBIOCALCANEAL FUSION;  Surgeon: Harden Jerona GAILS, MD;  Location: [facility] OR;  Service: Orthopedics;  Laterality: Right;   ANTERIOR FUSION CERVICAL SPINE  2012   APPLICATION OF WOUND VAC Left 12/11/2024   Procedure: APPLICATION, WOUND VAC;  Surgeon: Harden Jerona GAILS, MD;  Location: [facility] OR;  Service: Orthopedics;  Laterality: Left;   CARDIAC CATHETERIZATION  2011   CARDIAC CATHETERIZATION N/A 11/09/2016    Procedure: Right Heart Cath;  Surgeon: Victory LELON Sharps, MD;  Location: [facility] INVASIVE CV LAB;  Service: Cardiovascular;  Laterality: N/A;   carpel tunnel     left wrist   CATARACT EXTRACTION     CATARACT EXTRACTION W/ INTRAOCULAR LENS  IMPLANT, BILATERAL  2013   CERVICAL LAMINECTOMY  2012   COLONOSCOPY N/A 01/14/2013   Procedure: COLONOSCOPY;  Surgeon: Norleen SAILOR Kiang, MD;  Location: [facility] ENDOSCOPY;  Service: Endoscopy;  Laterality: N/A;   CORONARY ARTERY BYPASS GRAFT  11/04/2020   LIMA-LAD, SVG-OM1, SVG-PDA (Dr Norleen Exon [facility]) dc 11/18/2020   EYE SURGERY     I & D EXTREMITY Right 02/19/2021   Procedure: RIGHT ANKLE DEBRIDEMENT AND PLACEMENT ANTIBIOTIC BEADS;  Surgeon: Harden Jerona GAILS, MD;  Location: [facility] OR;  Service: Orthopedics;  Laterality: Right;   KNEE SURGERY  1998   left   LEFT HEART CATH AND CORONARY ANGIOGRAPHY N/A 07/10/2020   Procedure: LEFT HEART CATH AND CORONARY ANGIOGRAPHY;  Surgeon: Wonda Sharper, MD;  Location: [facility] INVASIVE CV LAB;  Service: Cardiovascular;  Laterality: N/A;   LUMBAR LAMINECTOMY  2003   LUNG BIOPSY Left 12/26/2016   Procedure: LUNG BIOPSY;  Surgeon: Elspeth JAYSON Millers, MD;  Location: [facility] OR;  Service:  Thoracic;  Laterality: Left;   PACEMAKER IMPLANT N/A 03/30/2020   Procedure: PACEMAKER IMPLANT;  Surgeon: Waddell Danelle ORN, MD;  Location: [facility] INVASIVE CV LAB;  Service: Cardiovascular;  Laterality: N/A;   PERIPHERAL VASCULAR INTERVENTION Right 12/10/2020   Procedure: PERIPHERAL VASCULAR INTERVENTION;  Surgeon: Magda Debby SAILOR, MD;  Location: [facility] INVASIVE CV LAB;  Service: Cardiovascular;  Laterality: Right;  SFA   POSTERIOR FUSION CERVICAL SPINE  2012   PPM GENERATOR REMOVAL N/A 12/14/2020   Procedure: PPM GENERATOR REMOVAL;  Surgeon: Waddell Danelle ORN, MD;  Location: [facility] INVASIVE CV LAB;  Service: Cardiovascular;  Laterality: N/A;   TEE WITHOUT CARDIOVERSION N/A 12/11/2020   Procedure: TRANSESOPHAGEAL ECHOCARDIOGRAM (TEE);  Surgeon: Barbaraann Darryle Debby, MD;  Location: [facility]  ENDOSCOPY;  Service: Cardiovascular;  Laterality: N/A;   TRIGGER FINGER RELEASE  2011   4th finger left hand   VIDEO ASSISTED THORACOSCOPY Left 12/26/2016   Procedure: VIDEO ASSISTED THORACOSCOPY;  Surgeon: Elspeth JAYSON Millers, MD;  Location: [facility] OR;  Service: Thoracic;  Laterality: Left;   VIDEO BRONCHOSCOPY N/A 12/26/2016   Procedure: VIDEO BRONCHOSCOPY;  Surgeon: Elspeth JAYSON Millers, MD;  Location: [facility] OR;  Service: Thoracic;  Laterality: N/A;   Family History  Problem Relation Age of Onset   Diabetes Mellitus II Mother    Emphysema Father 1   Heart attack Father    Colon cancer Neg Hx    Esophageal cancer Neg Hx    Rectal cancer Neg Hx    Stomach cancer Neg Hx    Social History:  reports that he has never smoked. He has been exposed to tobacco smoke. He has never used smokeless tobacco. He reports that he does not drink alcohol and does not use drugs. Allergies: Allergies[1] Medications Prior to Admission  Medication Sig Dispense Refill   acetaminophen  (TYLENOL ) 500 MG tablet Take 1,000 mg by mouth every 8 (eight) hours as needed for mild pain (pain score 1-3) or moderate pain (pain score 4-6).     albuterol  (VENTOLIN  HFA) 108 (90 Base) MCG/ACT inhaler Inhale 2 puffs into the lungs every 4 (four) hours as needed for wheezing or shortness of breath. 6.7 g 0   apixaban  (ELIQUIS ) 5 MG TABS tablet Take 5 mg by mouth 2 (two) times daily.     Ascorbic Acid  (VITAMIN C ) 1000 MG tablet Take 1,000 mg by mouth daily.     atorvastatin  (LIPITOR ) 80 MG tablet TAKE 1 TABLET BY MOUTH EVERY DAY 90 tablet 3   diphenhydrAMINE  (BENADRYL ) 50 MG tablet Take 50 mg by mouth at bedtime as needed for itching.     docusate sodium  (COLACE) 100 MG capsule Take 1 capsule (100 mg total) by mouth 2 (two) times daily. (Patient taking differently: Take 100 mg by mouth at bedtime.) 10 capsule 0   escitalopram  (LEXAPRO ) 10 MG tablet Take 1 tablet (10 mg total) by mouth daily. 30 tablet 0   furosemide  (LASIX ) 20 MG  tablet Take 20 mg by mouth daily with lunch.     furosemide  (LASIX ) 40 MG tablet Take 1 tablet (40 mg total) by mouth 2 (two) times daily.     gabapentin  (NEURONTIN ) 100 MG capsule Take 1 capsule (100 mg total) by mouth 3 (three) times daily. (Patient taking differently: Take 100 mg by mouth 3 (three) times daily as needed (Pain).) 90 capsule 0   HUMALOG  KWIKPEN 100 UNIT/ML KwikPen Inject 10-15 Units into the skin 3 (three) times daily. Can take 15-18 Units with meals while on Prednisone  for 3days (  Patient taking differently: Inject 15-20 Units into the skin 3 (three) times daily with meals. Sliding scale)     HYDROcodone -acetaminophen  (NORCO/VICODIN) 5-325 MG tablet Take 1 tablet by mouth at bedtime as needed for moderate pain (pain score 4-6) or severe pain (pain score 7-10).     hydrOXYzine  (ATARAX ) 25 MG tablet Take 25 mg by mouth at bedtime.     ibuprofen (ADVIL) 200 MG tablet Take 400 mg by mouth every 6 (six) hours as needed for moderate pain (pain score 4-6) or mild pain (pain score 1-3).     lisinopril  (ZESTRIL ) 10 MG tablet Take 10 mg by mouth daily.     LORazepam  (ATIVAN ) 1 MG tablet Take 1 tablet (1 mg total) by mouth at bedtime as needed for sleep. 3 tablet 0   Magnesium  200 MG TABS Take 200 mg by mouth at bedtime.     methocarbamol  (ROBAXIN ) 500 MG tablet Take 1 tablet (500 mg total) by mouth every 8 (eight) hours as needed for muscle spasms. 30 tablet 0   Multiple Vitamin (MULTIVITAMIN WITH MINERALS) TABS tablet Take 1 tablet by mouth in the morning.     Multiple Vitamins-Minerals (ICAPS AREDS FORMULA PO) Take 2 tablets by mouth daily.     Nutritional Supplements (FEEDING SUPPLEMENT, NEPRO CARB STEADY,) LIQD Take 237 mLs by mouth 2 (two) times daily between meals. (Patient taking differently: Take 237 mLs by mouth 2 (two) times daily between meals. Premier)     Olopatadine  HCl (PATADAY  OP) Place 2 drops into both eyes daily as needed (allergies).     OXYGEN Inhale 2 L into the lungs See  admin instructions. Inhale 3L of Oxygen everynight at bedside. May uses throughout the day as needed.     pantoprazole  (PROTONIX ) 40 MG tablet Take 1 tablet (40 mg total) by mouth 2 (two) times daily. 60 tablet 0   polyethylene glycol powder (GLYCOLAX /MIRALAX ) 17 GM/SCOOP powder Take 17 g by mouth daily as needed for mild constipation or moderate constipation.     Probiotic Product (ALIGN) 10 MG CAPS Take 10 mg by mouth daily.     traZODone  (DESYREL ) 50 MG tablet Take 75 mg by mouth at bedtime.     TRESIBA  FLEXTOUCH 200 UNIT/ML FlexTouch Pen Inject 20 Units into the skin in the morning.     zinc  sulfate 220 (50 Zn) MG capsule Take 1 capsule (220 mg total) by mouth daily. 30 capsule 0   albuterol  (PROVENTIL ) (2.5 MG/3ML) 0.083% nebulizer solution Take 2.5 mg by nebulization 2 (two) times daily as needed for wheezing or shortness of breath.     EPINEPHrine  0.3 mg/0.3 mL IJ SOAJ injection Inject 0.3 mg into the muscle as needed for anaphylaxis. 1 each 1   mupirocin  ointment (BACTROBAN ) 2 % Apply 1 Application topically daily as needed (Rash).     triamcinolone cream (KENALOG) 0.1 % Apply 1 Application topically daily as needed (Rash).      Home: Home Living Family/patient expects to be discharged to:: Private residence Living Arrangements: Spouse/significant other, Other relatives (grandson) Available Help at Discharge: Family, Available 24 hours/day Type of Home: House Home Access: Ramped entrance Home Layout: Two level, Able to live on main level with bedroom/bathroom Bathroom Shower/Tub: Health Visitor: Handicapped height Bathroom Accessibility: Yes Home Equipment: Information systems manager, Agricultural Consultant (2 wheels), Art gallery manager, Wheelchair - manual, Medical Laboratory Scientific Officer - single point, Other (comment), Grab bars - tub/shower, Grab bars - toilet, Adaptive equipment Adaptive Equipment: Reacher, Sock aid, Long-handled shoe horn, Long-handled sponge  Functional History: Prior Function Prior Level of  Function : Independent/Modified Independent Mobility Comments: Uses a scooter in house and when out and about ADLs Comments: pt able to transfer to shower seat Functional Status:  Mobility: Bed Mobility Overal bed mobility: Needs Assistance Bed Mobility: Supine to Sit, Sit to Supine Supine to sit: Max assist, +2 for physical assistance, HOB elevated Sit to supine: Max assist, +2 for physical assistance General bed mobility comments: With cueing for step by step sequencing, pt was able to slowly advance BLEs towards EOB, and then used RUE on R bed railing as well as LUE via reaching across to assist with pulling self towards EOB. Pt then required max A +2 for obtaining upright position. Increased time to complete. Transfers General transfer comment: deferred at this time      ADL: ADL General ADL Comments: Working on bed mobility,  sitting EOB balance today as precursor to basic ADLs, and transfers and following commands  Cognition: Cognition Orientation Level: Oriented to person Cognition Arousal: Lethargic, Alert (alert to begin and became more lethargic as progressed with acivity) Behavior During Therapy: Flat affect  Blood pressure 137/60, pulse 95, temperature 98.1 F (36.7 C), temperature source Axillary, resp. rate 19, height 6' (1.829 m), weight 104 kg, SpO2 97%. Physical Exam  Constitutional: No apparent distress. Appropriate appearance for age.  HENT: No JVD. Neck Supple. Trachea midline. Atraumatic, normocephalic. Eyes: PERRL. EOMI. Visual fields grossly intact in all 4 quadrants with bilateral monocular testing. + Difficulty with convergence/accommodation.  Cardiovascular: RRR, no murmurs/rub/gallops.  1+ left lower extremity edema.   Respiratory: CTAB. No rales, rhonchi, or wheezing.  On 2 L nasal cannula. Abdomen: + bowel sounds, normoactive. No distention or tenderness. + Fecal management system, with liquid brown stool. GU: Not examined. +Foley, draining clear  urine.  Skin:  + Wound VAC over left transtibial amputation site; scant sanguinous drainage in [facility]  MSK:      Status post bilateral BKA      Right knee contracture -5 degrees; left knee full active range of motion.       Neurologic exam:  Cognition: AAO to person, place, time and event.   Language: Moderate dysarthria, occasional substitutions. Names 2/3 objects correctly.   Memory: Recalls 1/3 objects at 5 minutes.  Waxes and wanes. Insight: Poor insight into current condition.  Mood: Pleasant affect, appropriate mood.  Sensation: Sensation reduced in bilateral fingertips. Reflexes: 2+ in BL UE and LEs.   CN: 2-12 grossly intact.   Coordination: No apparent tremors. No ataxia on FTN, HTS bilaterally.    Spasticity: MAS 0 in all extremities.        Strength:                RUE: 5/5 SA, 5/5 EF, 5/5 EE, 5/5 WE, 5/5 FF, 5/5 FA                LUE:  5/5 SA, 5/5 EF, 5/5 EE, 5/5 WE, 5/5 FF, 5/5 FA                RLE: 5/5 HF, 5/5 KE                 LLE:  5/5 HF, 4/5 KE    Results for orders placed or performed during the hospital encounter of 12/11/24 (from the past 24 hours)  Glucose, capillary     Status: Abnormal   Collection Time: 12/19/24  1:40 PM  Result Value Ref Range   Glucose-Capillary  124 (H) 70 - 99 mg/dL  Glucose, capillary     Status: Abnormal   Collection Time: 12/19/24  5:30 PM  Result Value Ref Range   Glucose-Capillary 168 (H) 70 - 99 mg/dL  Glucose, capillary     Status: Abnormal   Collection Time: 12/19/24  8:58 PM  Result Value Ref Range   Glucose-Capillary 175 (H) 70 - 99 mg/dL  Glucose, capillary     Status: Abnormal   Collection Time: 12/20/24 12:10 AM  Result Value Ref Range   Glucose-Capillary 181 (H) 70 - 99 mg/dL  Renal function panel (daily at 0500)     Status: Abnormal   Collection Time: 12/20/24  4:30 AM  Result Value Ref Range   Sodium 135 135 - 145 mmol/L   Potassium 4.2 3.5 - 5.1 mmol/L   Chloride 97 (L) 98 - 111 mmol/L   CO2 26 22 - 32  mmol/L   Glucose, Bld 136 (H) 70 - 99 mg/dL   BUN 881 (H) 8 - 23 mg/dL   Creatinine, Ser 6.22 (H) 0.61 - 1.24 mg/dL   Calcium  9.0 8.9 - 10.3 mg/dL   Phosphorus 4.2 2.5 - 4.6 mg/dL   Albumin  3.1 (L) 3.5 - 5.0 g/dL   GFR, Estimated 15 (L) >60 mL/min   Anion gap 12 5 - 15  Magnesium      Status: Abnormal   Collection Time: 12/20/24  4:30 AM  Result Value Ref Range   Magnesium  2.6 (H) 1.7 - 2.4 mg/dL  Glucose, capillary     Status: Abnormal   Collection Time: 12/20/24  4:53 AM  Result Value Ref Range   Glucose-Capillary 137 (H) 70 - 99 mg/dL  Glucose, capillary     Status: Abnormal   Collection Time: 12/20/24  8:04 AM  Result Value Ref Range   Glucose-Capillary 133 (H) 70 - 99 mg/dL  Glucose, capillary     Status: Abnormal   Collection Time: 12/20/24 11:52 AM  Result Value Ref Range   Glucose-Capillary 176 (H) 70 - 99 mg/dL   *Note: Due to a large number of results and/or encounters for the requested time period, some results have not been displayed. A complete set of results can be found in Results Review.   DG Swallowing Func-Speech Pathology Result Date: 12/19/2024 Table formatting from the original result was not included. Modified Barium Swallow Study Patient Details Name: Shane [name] MRN: [id number] Date of Birth: 1941/06/19 Today's Date: 12/19/2024 HPI/PMH: HPI: 84 yo male admitted 1/7 for L transtibial amputation. Transferred to ICU 1/8 for hypotension. CRRT started 1/10. Intermittent dialysis started 1/14. Seen by SLP 12/2020 and 12/2023 with concern for primary esophageal dysphagia, complaining of difficulty with solids>liquids and episodes of regurgitation. Declined an MBS and although recommended, esophageal assessment was not been completed. PMH: CKD III, DM, HTN, HLD, IPF, OSA on CPAP, HFrEF with LVEF 30-35% Clinical Impression: Clinical Impression: Pt has an oropharyngeal dysphagia that is likely exacerbated by mentation. He is not yet ready for a PO diet. Would continue using  temporary, alternative means of nutrition with ongoing time to rebuild strength and allow for mentation to improve. Pt is more lethargic this afternoon compared to this morning, still waking up for PO trials though. He has minimal oral residue that he typically clears with a second swallow. Pharyngeally he has reduced base of tongue retraction and pharyngeal squeeze, contributing to incomplete epiglottic inversion (almost no inversion with smaller, lighter boluses) and vallecular residue. Presence of cervical hardware may also contribute  from a structural standpoint. Pt has incomplete laryngeal vestibule closure and deep penetration to the vocal folds occurs with thin liquids, as well as more shallow penetration with thicker liquids. Penetration also occurs after the swallow with thicker liquids from residuals, also likely to have mixed with residue from solid trials. Across the course of the study pt inconsistently coughs and clears his throat but can never clear the penetrates, and they ultimately spill below his vocal folds to become aspiration. Factors that may increase risk of adverse event in presence of aspiration [name]: Factors that may increase risk of adverse event in presence of aspiration [name]: Respiratory or GI disease; Reduced cognitive function; Limited mobility; Frail or deconditioned; Weak cough; Aspiration of thick, dense, and/or acidic materials Recommendations/Plan: Swallowing Evaluation Recommendations Swallowing Evaluation Recommendations Recommendations: NPO; Alternative means of nutrition - NG Tube; Ice chips PRN after oral care (limited amounts of ice chips after oral care) Medication Administration: Via alternative means Oral care recommendations: Oral care QID (4x/day); Oral care before ice chips/water Caregiver Recommendations: Have oral suction available Treatment Plan Treatment Plan Treatment recommendations: Therapy as outlined in treatment plan  below Follow-up recommendations: Acute inpatient rehab (3 hours/day) Functional status assessment: Patient has had a recent decline in their functional status and demonstrates the ability to make significant improvements in function in a reasonable and predictable amount of time. Treatment frequency: Min 2x/week Treatment duration: 2 weeks Interventions: Aspiration precaution training; Patient/family education; Trials of upgraded texture/liquids; Respiratory muscle strength training; Oropharyngeal exercises; Compensatory techniques Recommendations Recommendations for follow up therapy are one component of a multi-disciplinary discharge planning process, led by the attending physician.  Recommendations may be updated based on patient status, additional functional criteria and insurance authorization. Assessment: Orofacial Exam: Orofacial Exam Oral Cavity - Dentition: Adequate natural dentition; Missing dentition Anatomy: Anatomy: Presence of cervical hardware Boluses Administered: Boluses Administered Boluses Administered: Thin liquids (Level 0); Mildly thick liquids (Level 2, nectar thick); Moderately thick liquids (Level 3, honey thick); Puree; Solid  Oral Impairment Domain: Oral Impairment Domain Lip Closure: -- (not well visualized on imaging but no overt loss noted clinically) Tongue control during bolus hold: Posterior escape of greater than half of bolus Bolus preparation/mastication: Timely and efficient chewing and mashing Bolus transport/lingual motion: Brisk tongue motion Oral residue: Residue collection on oral structures Location of oral residue : Tongue Initiation of pharyngeal swallow : Valleculae  Pharyngeal Impairment Domain: Pharyngeal Impairment Domain Soft palate elevation: No bolus between soft palate (SP)/pharyngeal wall (PW) Laryngeal elevation: Complete superior movement of thyroid cartilage with complete approximation of arytenoids to epiglottic petiole Anterior hyoid excursion: Complete  anterior movement Epiglottic movement: No inversion Laryngeal vestibule closure: Incomplete, narrow column air/contrast in laryngeal vestibule Pharyngeal stripping wave : Present - diminished Pharyngeal contraction (A/P view only): N/A Pharyngoesophageal segment opening: Partial distention/partial duration, partial obstruction of flow Tongue base retraction: Narrow column of contrast or air between tongue base and PPW Pharyngeal residue: Collection of residue within or on pharyngeal structures Location of pharyngeal residue: Valleculae  Esophageal Impairment Domain: No data recorded Pill: No data recorded Penetration/Aspiration Scale Score: Penetration/Aspiration Scale Score 8.  Material enters airway, passes BELOW cords without attempt by patient to eject out (silent aspiration) : Thin liquids (Level 0); Mildly thick liquids (Level 2, nectar thick); Moderately thick liquids (Level 3, honey thick) Compensatory Strategies: No data recorded  General Information: Caregiver present: No  Diet Prior to this Study: NPO; Cortrak/Small bore NG tube   Temperature : Normal  Respiratory Status: WFL   Supplemental O2: Nasal cannula   History of Recent Intubation: No  Behavior/Cognition: Lethargic/Drowsy; Requires cueing Self-Feeding Abilities: Needs assist with self-feeding Baseline vocal quality/speech: Normal Volitional Cough: Able to elicit Volitional Swallow: Able to elicit Exam Limitations: Fatigue Goal Planning: Prognosis for improved oropharyngeal function: Good Barriers to Reach Goals: Cognitive deficits No data recorded Patient/Family Stated Goal: none stated Consulted and agree with results and recommendations: Patient Pain: Pain Assessment Pain Assessment: Faces Faces Pain Scale: 0 Pain Intervention(s): Limited activity within patient's tolerance; Monitored during session End of Session: Start Time:SLP Start Time (ACUTE ONLY): 1518 Stop Time: SLP Stop Time (ACUTE ONLY): 1537 Time Calculation:SLP Time Calculation  (min) (ACUTE ONLY): 19 min Charges: SLP Evaluations $ SLP Speech Visit: 1 Visit SLP Evaluations $MBS Swallow: 1 Procedure $Swallowing Treatment: 1 Procedure SLP visit diagnosis: SLP Visit Diagnosis: Dysphagia, oropharyngeal phase (R13.12) Past Medical History: Past Medical History: Diagnosis Date  Acute blood loss anemia   Anxiety   AV block, Mobitz 1   Cataract   Chronic kidney disease   d/t DM  CKD (chronic kidney disease), stage III (HCC)   COVID-19 virus infection 07/18/2020  Last Assessment & Plan:   Formatting of this note might be different from the original.  Immunocompromise high risk patient  -ID consulted for further therapies and will administer regeneron as OP tomorrow   -Decadron  discontinued as patient is off O2    Depression   Diabetes mellitus   Vgo disposal insulin  bolus  simular to insulin  pump  Dyspnea   GERD (gastroesophageal reflux disease)   History of kidney stones   passed  Hyperlipidemia   Hypertension   Idiopathic pulmonary fibrosis (HCC) 11/2016  ILD (interstitial lung disease) (HCC)   Moderate aortic stenosis   a. 10/2019 Echo: EF 55-60%, Gr2 DD. Nl RV.   Neuromuscular disorder (HCC)   Neuropathy associated with endocrine disorder   Nonobstructive CAD (coronary artery disease)   a. 2012 Cath: mod, nonobs dzs; b. 10/2016 MV: EF 60%, no ischemia.  OSA on CPAP 05/05/2017  Unattended Home Sleep Test 7/2/813-AHI 38.6/hour, desaturation to 64%, body weight 261 pounds  PONV (postoperative nausea and vomiting)   Postoperative anemia due to acute blood loss 11/07/2020  Postoperative hemorrhagic shock 03/20/2021  Sleep apnea    uses cpap asked to bring mask and tubing Past Surgical History: Past Surgical History: Procedure Laterality Date  ABDOMINAL AORTOGRAM W/LOWER EXTREMITY N/A 12/10/2020  Procedure: ABDOMINAL AORTOGRAM W/LOWER EXTREMITY;  Surgeon: Magda Debby SAILOR, MD;  Location: [facility];  Service: Cardiovascular;  Laterality: N/A;  AMPUTATION Right 01/22/2021  Procedure: RIGHT 5TH RAY  AMPUTATION;  Surgeon: Harden Jerona GAILS, MD;  Location: [facility] OR;  Service: Orthopedics;  Laterality: Right;  AMPUTATION Right 03/17/2021  Procedure: RIGHT BELOW KNEE AMPUTATION;  Surgeon: Harden Jerona GAILS, MD;  Location: [facility] OR;  Service: Orthopedics;  Laterality: Right;  AMPUTATION Left 12/11/2024  Procedure: AMPUTATION BELOW KNEE;  Surgeon: Harden Jerona GAILS, MD;  Location: [facility] OR;  Service: Orthopedics;  Laterality: Left;  ANKLE FUSION Right 01/22/2021  Procedure: RIGHT FOOT TIBIOCALCANEAL FUSION;  Surgeon: Harden Jerona GAILS, MD;  Location: [facility] OR;  Service: Orthopedics;  Laterality: Right;  ANTERIOR FUSION CERVICAL SPINE  2012  APPLICATION OF WOUND VAC Left 12/11/2024  Procedure: APPLICATION, WOUND VAC;  Surgeon: Harden Jerona GAILS, MD;  Location: [facility] OR;  Service: Orthopedics;  Laterality: Left;  CARDIAC CATHETERIZATION  2011  CARDIAC CATHETERIZATION N/A 11/09/2016  Procedure: Right Heart Cath;  Surgeon: Victory ORN  Claudene, MD;  Location: [facility];  Service: Cardiovascular;  Laterality: N/A;  carpel tunnel    left wrist  CATARACT EXTRACTION    CATARACT EXTRACTION W/ INTRAOCULAR LENS  IMPLANT, BILATERAL  2013  CERVICAL LAMINECTOMY  2012  COLONOSCOPY N/A 01/14/2013  Procedure: COLONOSCOPY;  Surgeon: Norleen LOISE Kiang, MD;  Location: [facility] ENDOSCOPY;  Service: Endoscopy;  Laterality: N/A;  CORONARY ARTERY BYPASS GRAFT  11/04/2020  LIMA-LAD, SVG-OM1, SVG-PDA (Dr Norleen Exon [facility]) dc 11/18/2020  EYE SURGERY    I & D EXTREMITY Right 02/19/2021  Procedure: RIGHT ANKLE DEBRIDEMENT AND PLACEMENT ANTIBIOTIC BEADS;  Surgeon: Harden Jerona GAILS, MD;  Location: [facility] OR;  Service: Orthopedics;  Laterality: Right;  KNEE SURGERY  1998  left  LEFT HEART CATH AND CORONARY ANGIOGRAPHY N/A 07/10/2020  Procedure: LEFT HEART CATH AND CORONARY ANGIOGRAPHY;  Surgeon: Wonda Sharper, MD;  Location: [facility] INVASIVE CV LAB;  Service: Cardiovascular;  Laterality: N/A;  LUMBAR LAMINECTOMY  2003  LUNG BIOPSY Left 12/26/2016  Procedure: LUNG BIOPSY;  Surgeon: Elspeth JAYSON Millers, MD;   Location: [facility] OR;  Service: Thoracic;  Laterality: Left;  PACEMAKER IMPLANT N/A 03/30/2020  Procedure: PACEMAKER IMPLANT;  Surgeon: Waddell Danelle ORN, MD;  Location: [facility];  Service: Cardiovascular;  Laterality: N/A;  PERIPHERAL VASCULAR INTERVENTION Right 12/10/2020  Procedure: PERIPHERAL VASCULAR INTERVENTION;  Surgeon: Magda Debby LOISE, MD;  Location: [facility];  Service: Cardiovascular;  Laterality: Right;  SFA  POSTERIOR FUSION CERVICAL SPINE  2012  PPM GENERATOR REMOVAL N/A 12/14/2020  Procedure: PPM GENERATOR REMOVAL;  Surgeon: Waddell Danelle ORN, MD;  Location: [facility];  Service: Cardiovascular;  Laterality: N/A;  TEE WITHOUT CARDIOVERSION N/A 12/11/2020  Procedure: TRANSESOPHAGEAL ECHOCARDIOGRAM (TEE);  Surgeon: Barbaraann Darryle Debby, MD;  Location: [facility] ENDOSCOPY;  Service: Cardiovascular;  Laterality: N/A;  TRIGGER FINGER RELEASE  2011  4th finger left hand  VIDEO ASSISTED THORACOSCOPY Left 12/26/2016  Procedure: VIDEO ASSISTED THORACOSCOPY;  Surgeon: Elspeth JAYSON Millers, MD;  Location: [facility] OR;  Service: Thoracic;  Laterality: Left;  VIDEO BRONCHOSCOPY N/A 12/26/2016  Procedure: VIDEO BRONCHOSCOPY;  Surgeon: Elspeth JAYSON Millers, MD;  Location: [facility] OR;  Service: Thoracic;  Laterality: N/A; [name] LOISE., M.A. 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 Trude 1-7 Does the need for close, 24 hr/day medical supervision in concert with the patient's rehab needs make it unreasonable for this patient to be served in a less intensive setting? 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  Due to bladder management, bowel management, safety, skin/wound care, disease management, medication administration, pain management, and patient education, does the patient require 24 hr/day rehab nursing? Yes Does the patient require coordinated care of a physician, rehab nurse, therapy disciplines of PT, OT, SLP to address physical and functional deficits in the context of the above medical diagnosis(es)? Yes Addressing deficits in the following areas: balance, endurance, locomotion, strength, transferring, bowel/bladder control, bathing, dressing, feeding, grooming, toileting, cognition, speech, language, and swallowing Can the patient actively participate in an intensive therapy program of at least 3 hrs of therapy per day at least 5 days per week? Yes The  potential for patient to make measurable gains while on inpatient rehab is good Anticipated functional outcomes upon discharge from inpatient rehab are min assist  with PT, supervision with OT, supervision with SLP. Estimated rehab length of stay to reach the above functional goals is: 10-14 days Anticipated discharge destination: Home Overall Rehab/Functional Prognosis: good  POST ACUTE RECOMMENDATIONS: This patient's condition is appropriate for continued rehabilitative care in the following setting: CIR Patient has agreed to participate in recommended program. Yes Note that insurance prior authorization may be required for reimbursement for recommended care.  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  He has excellent support at home with a wife and grandson who can provide 24-7 assistance at a min assist level, but will need an intensive  conditioning program to tolerate return to modified independent level with scooter and independent transfers.  Additionally, he is currently on hemodialysis with plans for long-term dialysis uncertain, and has ongoing cognitive deficits, dysphagia dependent on tube feeds, and orthostatic hypotension requiring close medical supervision.  He is an excellent candidate for inpatient rehab once medically stable.   MEDICAL RECOMMENDATIONS: Recommend delirium precautions with limited interruptions for vitals checks and labs at nighttime, providing curtains open and stimulation during the daytime to limit naps, and scheduling of trazodone  75 mg nightly to ensure reestablishment of sleep-wake cycle. Recommend discontinuation of rectal tube as soon as clinically safe, and DC Foley trial to reduce risk of infection   I have personally performed a face to face diagnostic evaluation of this patient. Additionally, I have examined the patient's medical record including any pertinent labs and radiographic images. If the physician assistant has documented in this note, I have reviewed and edited or otherwise concur with the physician assistant's documentation.  Thanks,  Joesph JAYSON Likes, DO 12/20/2024      [1]  Allergies Allergen Reactions   Codeine Hives and Itching   Ofev  [Nintedanib ] Diarrhea    SEVERE DIARRHEA   Pirfenidone  Diarrhea and Other (See Comments)    Esbriet  (Pirfenidone ) causes elevated LFTs. D/C on 06/14/17 and SEVERE DIARRHEA    Amoxicillin Itching   "

## 2024-12-20 NOTE — Progress Notes (Signed)
 Inpatient Rehab Coordinator Note:  I met with patient and his spouse at bedside to discuss CIR recommendations and goals/expectations of CIR stay.  We reviewed 3 hrs/day of therapy, physician follow up, and average length of stay 2 weeks (dependent upon progress) with goals tbd.  He is familiar to me from previous CIR stay.  Avelina confirms that pt has 24/7 care at home, typically uses a power scooter for household and community mobility (+/- prosthetic on the RLE depending on the day), and they have a ramp for home entry.  They prefer inpatient rehab at discharge.  We discussed current HD needs and needing to finalize whether or not he would need that long term.  I've asked rehab MD to see today and will plan to start insurance auth today as well.   Reche Lowers, PT, DPT Admissions Coordinator [phone] 12/20/24 10:48 AM

## 2024-12-20 NOTE — Progress Notes (Addendum)
 "  [facility] PROGRESS NOTE   Dasan Hardman FMW:[id number] DOB: 08/20/1941 DOA: 12/11/2024  PCP: Shepard Ade, MD  Brief History: 84 year old male with past medical history significant for chronic kidney disease stage III, diabetes, hypertension, hyperlipidemia, IPF, OSA on CPAP and at bedtime 2L Alexander, and HFrEF with LVEF 30 to 35%. He presented to [facility] 1/7 after failing conservative outpatient therapy for ischemic lower extremity ulcers and left foot with gangrene. He underwent transtibial amputation and was subsequently sent to the medical floor for recovery. In the early a.m. hours 1/8 he became hypotensive which did improve temporarily with crystalloid bolus, however, he again became hypotensive with systolic blood pressures in the 70s and 80s.  He was subsequently transferred to the ICU.  He was stabilized.  He was also noted to be in acute kidney injury.  Nephrology was consulted.  He underwent CRRT.  Stabilized and then transferred to the floor.  Consultants: Critical care medicine.  Nephrology.  Orthopedics.  Procedures: Left transtibial amputation 1/7   Subjective/Interval History: Patient noted to be much more engaging this morning though still distracted at times.  His wife is at the bedside.  Patient was able to tell me where he was and he could tell me the year and month.  Denies any significant pain.  According to his wife he is still not sleeping much at night and still tends to sleep mostly in the daytime.  They are agreeable to try Rozerem .    Assessment/Plan:  Septic shock Secondary to left foot gangrene.  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 Patient was in the ICU.  Required pressors.  Patient was stabilized. Patient was started on midodrine .  Blood pressures have improved with occasional high readings.  Midodrine  dose was decreased to 5 mg every 8 hours.  Blood pressure holding steady. He has completed course of Zosyn .  He was on  linezolid  previously as well.   Acute kidney injury on chronic kidney disease stage IIIb Patient had worsening in his renal function.  Nephrology was consulted.  Patient was started on CRRT.   Plan is for intermittent hemodialysis going forward.   It appears that he made more urine in the last 24 hours according to charting.  This is an encouraging sign. Further management per nephrology.  Acute metabolic encephalopathy/hospital delirium Remains encephalopathic though appears to show some improvement.  Continue to engage with patient on a frequent basis.  No focal neurological deficits noted.   Sleep cycle is still reversed.  Discussed Rozerem  with patient and his wife.  They are agreeable to try.  Discontinue the melatonin.  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  Continue SSI and glargine. HbA1c 7.6.  Chronic systolic CHF EF is 30 to 35% based on echocardiogram done on 12/12/2024. Cardiac status stable for the most part.  Volume being managed with hemodialysis.  Paroxysmal atrial fibrillation Eliquis  being continued.   Currently not on any rate limiting drugs due to his recent septic shock.  He is requiring midodrine  for blood pressure support.  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  Underwent left transtibial amputation during this hospitalization.   Normocytic anemia Likely anemia of chronic disease.  No evidence of overt bleeding.  Hemoglobin remains stable.  No need to check on a daily basis.  Oropharyngeal dysphagia/moderate protein calorie malnutrition Has a cortrak feeding tube.  Speech therapy is following.  Hopefully as his  mentation improves we will be able to start him on oral diet.  Underwent modified barium swallow yesterday and unfortunately diet cannot be advanced quite yet.  Patient and wife mentions that at baseline he has always had some degree of difficulty swallowing which  he attributes to his cervical spine issues.  Diarrhea Has had profuse stool output presumably from his tube feedings.  He was given Imodium .  Flexi-Seal had to be inserted.  We discussed with nursing staff.  Foley catheter. This was placed during this hospitalization.  Initiate Flomax .  Voiding trial hopefully in the next 24 to 48 hours if okay with nephrology.  History of sleep apnea Resume CPAP once feeding tube has been discontinued.  DVT Prophylaxis: On apixaban  Code Status: Full code Family Communication: Discussed with his wife Disposition Plan: CIR is being considered    Medications: Scheduled:  acidophilus  1 capsule Oral Daily   apixaban   2.5 mg Per Tube BID   vitamin C   1,000 mg Per Tube Daily   atorvastatin   80 mg Per Tube Daily   Chlorhexidine  Gluconate Cloth  6 each Topical Q0600   feeding supplement (PROSource TF20)  60 mL Per Tube TID   ferrous sulfate   300 mg Per Tube Q breakfast   gabapentin   100 mg Per Tube QHS   insulin  aspart  0-15 Units Subcutaneous Q4H   insulin  aspart  10 Units Subcutaneous Q4H   insulin  glargine  22 Units Subcutaneous BID   melatonin  3 mg Oral QHS   midodrine   5 mg Per Tube Q8H   multivitamin with minerals  1 tablet Per Tube q AM   nutrition supplement (JUVEN)  1 packet Per Tube BID BM   mouth rinse  15 mL Mouth Rinse 4 times per day   pantoprazole  (PROTONIX ) IV  40 mg Intravenous BID   sodium chloride  flush  3 mL Intravenous Q12H   Continuous:  feeding supplement (KATE FARMS STANDARD 1.4) Liquid 1,000 mL (12/19/24 0933)   PRN:acetaminophen , albuterol , dextromethorphan , EPINEPHrine , hydrOXYzine , [COMPLETED] loperamide  HCl **FOLLOWED BY** loperamide  HCl, methocarbamol , mupirocin  ointment, olopatadine , ondansetron , mouth rinse, oxyCODONE , phenol, sodium chloride  flush, traZODone   Antibiotics: Anti-infectives (From admission, onward)    Start     Dose/Rate Route Frequency Ordered Stop   12/18/24 0600  piperacillin -tazobactam  (ZOSYN ) IVPB 2.25 g        2.25 g 100 mL/hr over 30 Minutes Intravenous Every 8 hours 12/17/24 2149 12/19/24 2149   12/17/24 2030  piperacillin -tazobactam (ZOSYN ) IVPB 2.25 g  Status:  Discontinued        2.25 g 100 mL/hr over 30 Minutes Intravenous Every 6 hours 12/17/24 1738 12/17/24 2149   12/17/24 1430  piperacillin -tazobactam (ZOSYN ) IVPB 2.25 g  Status:  Discontinued        2.25 g 100 mL/hr over 30 Minutes Intravenous Every 6 hours 12/17/24 1335 12/17/24 1738   12/17/24 1400  piperacillin -tazobactam (ZOSYN ) IVPB 3.375 g  Status:  Discontinued        3.375 g 100 mL/hr over 30 Minutes Intravenous Every 8 hours 12/17/24 1010 12/17/24 1335   12/15/24 1430  linezolid  (ZYVOX ) IVPB 600 mg  Status:  Discontinued        600 mg 300 mL/hr over 60 Minutes Intravenous Every 12 hours 12/15/24 1343 12/17/24 1002   12/14/24 1800  piperacillin -tazobactam (ZOSYN ) IVPB 3.375 g  Status:  Discontinued        3.375 g 100 mL/hr over 30 Minutes Intravenous Every 6 hours 12/14/24 1350 12/17/24 1010  12/14/24 0945  vancomycin  (VANCOREADY) IVPB 1750 mg/350 mL        1,750 mg 175 mL/hr over 120 Minutes Intravenous  Once 12/14/24 0846 12/14/24 1250   12/13/24 1115  vancomycin  variable dose per unstable renal function (pharmacist dosing)  Status:  Discontinued         Does not apply See admin instructions 12/13/24 1115 12/15/24 1343   12/13/24 1115  vancomycin  (VANCOREADY) IVPB 2000 mg/400 mL        2,000 mg 200 mL/hr over 120 Minutes Intravenous  Once 12/13/24 1025 12/13/24 1533   12/13/24 1100  piperacillin -tazobactam (ZOSYN ) IVPB 3.375 g  Status:  Discontinued        3.375 g 12.5 mL/hr over 240 Minutes Intravenous Every 8 hours 12/13/24 1025 12/14/24 1350   12/11/24 0700  ceFAZolin  (ANCEF ) IVPB 2g/100 mL premix        2 g 200 mL/hr over 30 Minutes Intravenous On call to O.R. 12/11/24 0653 12/11/24 0843       Objective:  Vital Signs  Vitals:   12/19/24 2100 12/19/24 2355 12/20/24 0555 12/20/24  0805  BP:   (!) 130/58 (!) 134/59  Pulse: 87  76 69  Resp: 17  18 (!) 21  Temp:  97.8 F (36.6 C) 97.8 F (36.6 C) 98.1 F (36.7 C)  TempSrc:  Oral Oral Axillary  SpO2: 98%  98% 95%  Weight:   104 kg   Height:        Intake/Output Summary (Last 24 hours) at 12/20/2024 1024 Last data filed at 12/19/2024 2355 Gross per 24 hour  Intake 0 ml  Output 1000 ml  Net -1000 ml   Filed Weights   12/18/24 1403 12/19/24 0407 12/20/24 0555  Weight: 100.3 kg 108.1 kg 104 kg   General appearance: More awake and alert today compared to the last 2 days. Resp: Clear to auscultation bilaterally.  Normal effort Cardio: S1-S2 is normal regular.  No S3-S4.  No rubs murmurs or bruit GI: Abdomen is soft.  Nontender nondistended.  Bowel sounds are present normal.  No masses organomegaly Extremities: Status post bilateral lower extremity amputation  Lab Results:  Data Reviewed: I have personally reviewed following labs and reports of the imaging studies  CBC: Recent Labs  Lab 12/14/24 0225 12/16/24 0218 12/17/24 0432 12/18/24 0515 12/19/24 0457  WBC 16.7* 11.5* 9.4 9.6 10.7*  HGB 8.7* 8.4* 8.6* 8.5* 8.4*  HCT 26.6* 25.2* 25.9* 24.9* 25.1*  MCV 91.1 92.3 90.9 89.2 90.9  PLT 153 164 183 193 224    Basic Metabolic Panel: Recent Labs  Lab 12/16/24 0218 12/16/24 1553 12/17/24 0432 12/18/24 0515 12/19/24 0457 12/20/24 0430  NA 134* 136 136 135 136 135  K 4.6 4.6 4.2 4.1 3.9 4.2  CL 97* 100 100 98 97* 97*  CO2 26 27 27 24 26 26   GLUCOSE 191* 203* 205* 204* 173* 136*  BUN 50* 51* 57* 98* 93* 118*  CREATININE 1.90* 1.70* 2.17* 3.65* 3.34* 3.77*  CALCIUM  8.9 8.9 8.9 9.0 8.9 9.0  MG 2.3  --  2.5* 2.7* 2.6* 2.6*  PHOS 3.3 2.2* 2.9 3.1 3.9 4.2    GFR: Estimated Creatinine Clearance: 18.5 mL/min (A) (by C-G formula based on SCr of 3.77 mg/dL (H)).  Liver Function Tests: Recent Labs  Lab 12/16/24 1553 12/17/24 0432 12/18/24 0515 12/19/24 0457 12/20/24 0430  ALBUMIN  3.1* 3.3*  3.2* 3.1* 3.1*    CBG: Recent Labs  Lab 12/19/24 1730 12/19/24 2058 12/20/24 0010 12/20/24  0453 12/20/24 0804  GLUCAP 168* 175* 181* 137* 133*     Recent Results (from the past 240 hours)  MRSA Next Gen by PCR, Nasal     Status: None   Collection Time: 12/12/24  2:03 PM   Specimen: Nasal Mucosa; Nasal Swab  Result Value Ref Range Status   MRSA by PCR Next Gen NOT DETECTED NOT DETECTED Final    Comment: (NOTE) The GeneXpert MRSA Assay (FDA approved for NASAL specimens only), is one component of a comprehensive MRSA colonization surveillance program. It is not intended to diagnose MRSA infection nor to guide or monitor treatment for MRSA infections. Test performance is not FDA approved in patients less than 41 years old. Performed at [facility], [address], [place], [place] [address]   Culture, blood (Routine X 2) w Reflex to ID Panel     Status: None   Collection Time: 12/13/24 11:15 AM   Specimen: BLOOD  Result Value Ref Range Status   Specimen Description BLOOD SITE NOT SPECIFIED  Final   Special Requests   Final    BOTTLES DRAWN AEROBIC AND ANAEROBIC Blood Culture results may not be optimal due to an inadequate volume of blood received in culture bottles   Culture   Final    NO GROWTH 5 DAYS Performed at [facility], [address], [place], [place] [address]    Report Status 12/18/2024 FINAL  Final  Culture, blood (Routine X 2) w Reflex to ID Panel     Status: None   Collection Time: 12/13/24 11:15 AM   Specimen: BLOOD  Result Value Ref Range Status   Specimen Description BLOOD SITE NOT SPECIFIED  Final   Special Requests   Final    BOTTLES DRAWN AEROBIC AND ANAEROBIC Blood Culture adequate volume   Culture   Final    NO GROWTH 5 DAYS Performed at [facility], [address] [place], [place] [address]    Report Status 12/18/2024 FINAL  Final      Radiology Studies: DG Swallowing Func-Speech Pathology Result Date:  12/19/2024 Table formatting from the original result was not included. Modified Barium Swallow Study Patient Details Name: Shane [name] MRN: [id number] Date of Birth: 06-07-1941 Today's Date: 12/19/2024 HPI/PMH: HPI: 84 yo male admitted 1/7 for L transtibial amputation. Transferred to ICU 1/8 for hypotension. CRRT started 1/10. Intermittent dialysis started 1/14. Seen by SLP 12/2020 and 12/2023 with concern for primary esophageal dysphagia, complaining of difficulty with solids>liquids and episodes of regurgitation. Declined an MBS and although recommended, esophageal assessment was not been completed. PMH: CKD III, DM, HTN, HLD, IPF, OSA on CPAP, HFrEF with LVEF 30-35% Clinical Impression: Clinical Impression: Pt has an oropharyngeal dysphagia that is likely exacerbated by mentation. He is not yet ready for a PO diet. Would continue using temporary, alternative means of nutrition with ongoing time to rebuild strength and allow for mentation to improve. Pt is more lethargic this afternoon compared to this morning, still waking up for PO trials though. He has minimal oral residue that he typically clears with a second swallow. Pharyngeally he has reduced base of tongue retraction and pharyngeal squeeze, contributing to incomplete epiglottic inversion (almost no inversion with smaller, lighter boluses) and vallecular residue. Presence of cervical hardware may also contribute from a structural standpoint. Pt has incomplete laryngeal vestibule closure and deep penetration to the vocal folds occurs with thin liquids, as well as more shallow penetration with thicker liquids. Penetration also occurs  after the swallow with thicker liquids from residuals, also likely to have mixed with residue from solid trials. Across the course of the study pt inconsistently coughs and clears his throat but can never clear the penetrates, and they ultimately spill below his vocal folds to become aspiration. Factors that may increase risk of  adverse event in presence of aspiration [name]: Factors that may increase risk of adverse event in presence of aspiration [name]: Respiratory or GI disease; Reduced cognitive function; Limited mobility; Frail or deconditioned; Weak cough; Aspiration of thick, dense, and/or acidic materials Recommendations/Plan: Swallowing Evaluation Recommendations Swallowing Evaluation Recommendations Recommendations: NPO; Alternative means of nutrition - NG Tube; Ice chips PRN after oral care (limited amounts of ice chips after oral care) Medication Administration: Via alternative means Oral care recommendations: Oral care QID (4x/day); Oral care before ice chips/water Caregiver Recommendations: Have oral suction available Treatment Plan Treatment Plan Treatment recommendations: Therapy as outlined in treatment plan below Follow-up recommendations: Acute inpatient rehab (3 hours/day) Functional status assessment: Patient has had a recent decline in their functional status and demonstrates the ability to make significant improvements in function in a reasonable and predictable amount of time. Treatment frequency: Min 2x/week Treatment duration: 2 weeks Interventions: Aspiration precaution training; Patient/family education; Trials of upgraded texture/liquids; Respiratory muscle strength training; Oropharyngeal exercises; Compensatory techniques Recommendations Recommendations for follow up therapy are one component of a multi-disciplinary discharge planning process, led by the attending physician.  Recommendations may be updated based on patient status, additional functional criteria and insurance authorization. Assessment: Orofacial Exam: Orofacial Exam Oral Cavity - Dentition: Adequate natural dentition; Missing dentition Anatomy: Anatomy: Presence of cervical hardware Boluses Administered: Boluses Administered Boluses Administered: Thin liquids (Level 0); Mildly thick liquids (Level 2, nectar  thick); Moderately thick liquids (Level 3, honey thick); Puree; Solid  Oral Impairment Domain: Oral Impairment Domain Lip Closure: -- (not well visualized on imaging but no overt loss noted clinically) Tongue control during bolus hold: Posterior escape of greater than half of bolus Bolus preparation/mastication: Timely and efficient chewing and mashing Bolus transport/lingual motion: Brisk tongue motion Oral residue: Residue collection on oral structures Location of oral residue : Tongue Initiation of pharyngeal swallow : Valleculae  Pharyngeal Impairment Domain: Pharyngeal Impairment Domain Soft palate elevation: No bolus between soft palate (SP)/pharyngeal wall (PW) Laryngeal elevation: Complete superior movement of thyroid cartilage with complete approximation of arytenoids to epiglottic petiole Anterior hyoid excursion: Complete anterior movement Epiglottic movement: No inversion Laryngeal vestibule closure: Incomplete, narrow column air/contrast in laryngeal vestibule Pharyngeal stripping wave : Present - diminished Pharyngeal contraction (A/P view only): N/A Pharyngoesophageal segment opening: Partial distention/partial duration, partial obstruction of flow Tongue base retraction: Narrow column of contrast or air between tongue base and PPW Pharyngeal residue: Collection of residue within or on pharyngeal structures Location of pharyngeal residue: Valleculae  Esophageal Impairment Domain: No data recorded Pill: No data recorded Penetration/Aspiration Scale Score: Penetration/Aspiration Scale Score 8.  Material enters airway, passes BELOW cords without attempt by patient to eject out (silent aspiration) : Thin liquids (Level 0); Mildly thick liquids (Level 2, nectar thick); Moderately thick liquids (Level 3, honey thick) Compensatory Strategies: No data recorded  General Information: Caregiver present: No  Diet Prior to this Study: NPO; Cortrak/Small bore NG tube   Temperature : Normal   Respiratory Status: WFL    Supplemental O2: Nasal cannula   History of Recent Intubation: No  Behavior/Cognition: Lethargic/Drowsy; Requires cueing Self-Feeding Abilities: Needs assist with self-feeding Baseline vocal quality/speech:  Normal Volitional Cough: Able to elicit Volitional Swallow: Able to elicit Exam Limitations: Fatigue Goal Planning: Prognosis for improved oropharyngeal function: Good Barriers to Reach Goals: Cognitive deficits No data recorded Patient/Family Stated Goal: none stated Consulted and agree with results and recommendations: Patient Pain: Pain Assessment Pain Assessment: Faces Faces Pain Scale: 0 Pain Intervention(s): Limited activity within patient's tolerance; Monitored during session End of Session: Start Time:SLP Start Time (ACUTE ONLY): 1518 Stop Time: SLP Stop Time (ACUTE ONLY): 1537 Time Calculation:SLP Time Calculation (min) (ACUTE ONLY): 19 min Charges: SLP Evaluations $ SLP Speech Visit: 1 Visit SLP Evaluations $MBS Swallow: 1 Procedure $Swallowing Treatment: 1 Procedure SLP visit diagnosis: SLP Visit Diagnosis: Dysphagia, oropharyngeal phase (R13.12) Past Medical History: Past Medical History: Diagnosis Date  Acute blood loss anemia   Anxiety   AV block, Mobitz 1   Cataract   Chronic kidney disease   d/t DM  CKD (chronic kidney disease), stage III (HCC)   COVID-19 virus infection 07/18/2020  Last Assessment & Plan:   Formatting of this note might be different from the original.  Immunocompromise high risk patient  -ID consulted for further therapies and will administer regeneron as OP tomorrow   -Decadron  discontinued as patient is off O2    Depression   Diabetes mellitus   Vgo disposal insulin  bolus  simular to insulin  pump  Dyspnea   GERD (gastroesophageal reflux disease)   History of kidney stones   passed  Hyperlipidemia   Hypertension   Idiopathic pulmonary fibrosis (HCC) 11/2016  ILD (interstitial lung disease) (HCC)   Moderate aortic stenosis   a. 10/2019 Echo: EF 55-60%, Gr2 DD. Nl RV.    Neuromuscular disorder (HCC)   Neuropathy associated with endocrine disorder   Nonobstructive CAD (coronary artery disease)   a. 2012 Cath: mod, nonobs dzs; b. 10/2016 MV: EF 60%, no ischemia.  OSA on CPAP 05/05/2017  Unattended Home Sleep Test 7/2/813-AHI 38.6/hour, desaturation to 64%, body weight 261 pounds  PONV (postoperative nausea and vomiting)   Postoperative anemia due to acute blood loss 11/07/2020  Postoperative hemorrhagic shock 03/20/2021  Sleep apnea    uses cpap asked to bring mask and tubing Past Surgical History: Past Surgical History: Procedure Laterality Date  ABDOMINAL AORTOGRAM W/LOWER EXTREMITY N/A 12/10/2020  Procedure: ABDOMINAL AORTOGRAM W/LOWER EXTREMITY;  Surgeon: Magda Debby SAILOR, MD;  Location: [facility] INVASIVE CV LAB;  Service: Cardiovascular;  Laterality: N/A;  AMPUTATION Right 01/22/2021  Procedure: 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;  Surgeon: Harden Jerona GAILS, MD;  Location: [facility] OR;  Service: Orthopedics;  Laterality: Right;  AMPUTATION Right 03/17/2021  Procedure: RIGHT BELOW KNEE AMPUTATION;  Surgeon: Harden Jerona GAILS, MD;  Location: [facility] OR;  Service: Orthopedics;  Laterality: Right;  AMPUTATION Left 12/11/2024  Procedure: AMPUTATION BELOW KNEE;  Surgeon: Harden Jerona GAILS, MD;  Location: [facility] OR;  Service: Orthopedics;  Laterality: Left;  ANKLE FUSION Right 01/22/2021  Procedure: RIGHT FOOT TIBIOCALCANEAL FUSION;  Surgeon: Harden Jerona GAILS, MD;  Location: [facility] OR;  Service: Orthopedics;  Laterality: Right;  ANTERIOR FUSION CERVICAL SPINE  2012  APPLICATION OF WOUND VAC Left 12/11/2024  Procedure: APPLICATION, WOUND VAC;  Surgeon: Harden Jerona GAILS, MD;  Location: [facility] OR;  Service: Orthopedics;  Laterality: Left;  CARDIAC CATHETERIZATION  2011  CARDIAC CATHETERIZATION N/A 11/09/2016  Procedure: Right Heart Cath;  Surgeon: Victory LELON Sharps, MD;  Location: [facility] INVASIVE CV LAB;  Service: Cardiovascular;  Laterality: N/A;  carpel tunnel    left wrist  CATARACT EXTRACTION    CATARACT EXTRACTION W/  INTRAOCULAR LENS  IMPLANT,  BILATERAL  2013  CERVICAL LAMINECTOMY  2012  COLONOSCOPY N/A 01/14/2013  Procedure: COLONOSCOPY;  Surgeon: Norleen LOISE Kiang, MD;  Location: [facility] ENDOSCOPY;  Service: Endoscopy;  Laterality: N/A;  CORONARY ARTERY BYPASS GRAFT  11/04/2020  LIMA-LAD, SVG-OM1, SVG-PDA (Dr Norleen Exon [facility]) dc 11/18/2020  EYE SURGERY    I & D EXTREMITY Right 02/19/2021  Procedure: RIGHT ANKLE DEBRIDEMENT AND PLACEMENT ANTIBIOTIC BEADS;  Surgeon: Harden Jerona GAILS, MD;  Location: [facility] OR;  Service: Orthopedics;  Laterality: Right;  KNEE SURGERY  1998  left  LEFT HEART CATH AND CORONARY ANGIOGRAPHY N/A 07/10/2020  Procedure: LEFT HEART CATH AND CORONARY ANGIOGRAPHY;  Surgeon: Wonda Sharper, MD;  Location: [facility] INVASIVE CV LAB;  Service: Cardiovascular;  Laterality: N/A;  LUMBAR LAMINECTOMY  2003  LUNG BIOPSY Left 12/26/2016  Procedure: LUNG BIOPSY;  Surgeon: Elspeth JAYSON Millers, MD;  Location: [facility] OR;  Service: Thoracic;  Laterality: Left;  PACEMAKER IMPLANT N/A 03/30/2020  Procedure: PACEMAKER IMPLANT;  Surgeon: Waddell Danelle ORN, MD;  Location: [facility] LAB;  Service: Cardiovascular;  Laterality: N/A;  PERIPHERAL VASCULAR INTERVENTION Right 12/10/2020  Procedure: PERIPHERAL VASCULAR INTERVENTION;  Surgeon: Magda Debby LOISE, MD;  Location: [facility]VAS[facility] LAB;  Service: Cardiovascular;  Laterality: Right;  SFA  POSTERIOR FUSION CERVICAL SPINE  2012  PPM GENERATOR REMOVAL N/A 12/14/2020  Procedure: PPM GENERATOR REMOVAL;  Surgeon: Waddell Danelle ORN, MD;  Location: [facility] INVASIVE CV LAB;  Service: Cardiovascular;  Laterality: N/A;  TEE WITHOUT CARDIOVERSION N/A 12/11/2020  Procedure: TRANSESOPHAGEAL ECHOCARDIOGRAM (TEE);  Surgeon: Barbaraann Darryle Debby, MD;  Location: [facility] ENDOSCOPY;  Service: Cardiovascular;  Laterality: N/A;  TRIGGER FINGER RELEASE  2011  4th finger left hand  VIDEO ASSISTED THORACOSCOPY Left 12/26/2016  Procedure: VIDEO ASSISTED THORACOSCOPY;  Surgeon: Elspeth JAYSON Millers, MD;  Location: [facility] OR;  Service: Thoracic;  Laterality: Left;  VIDEO  BRONCHOSCOPY N/A 12/26/2016  Procedure: VIDEO BRONCHOSCOPY;  Surgeon: Elspeth JAYSON Millers, MD;  Location: [facility] OR;  Service: Thoracic;  Laterality: N/A; [name] LOISE., M.A. CCC-SLP Acute Rehabilitation Services Office: [phone] Secure chat preferred 12/19/2024, 4:26 PM      LOS: 9 days   Cinque Begley [employer] on [email or web address]  12/20/2024, 10:24 AM   "

## 2024-12-20 NOTE — Progress Notes (Signed)
 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, status post left BKA, resolving septic shock  Subjective:  No new events Made 1 L urine output yesterday BUN and creatinine continue to increase during the interdialytic window  01/15 0701 - 01/16 0700 In: 0  Out: 1000 [Urine:1000]  Filed Weights   12/18/24 1403 12/19/24 0407 12/20/24 0555  Weight: 100.3 kg 108.1 kg 104 kg    Scheduled Meds:  acidophilus  1 capsule Oral Daily   apixaban   2.5 mg Per Tube BID   vitamin C   1,000 mg Per Tube Daily   atorvastatin   80 mg Per Tube Daily   Chlorhexidine  Gluconate Cloth  6 each Topical Q0600   feeding supplement (PROSource TF20)  60 mL Per Tube TID   ferrous sulfate   300 mg Per Tube Q breakfast   gabapentin   100 mg Per Tube QHS   insulin  aspart  0-15 Units Subcutaneous Q4H   insulin  aspart  10 Units Subcutaneous Q4H   insulin  glargine  22 Units Subcutaneous BID   midodrine   5 mg Per Tube Q8H   multivitamin with minerals  1 tablet Per Tube q AM   nutrition supplement (JUVEN)  1 packet Per Tube BID BM   mouth rinse  15 mL Mouth Rinse 4 times per day   pantoprazole  (PROTONIX ) IV  40 mg Intravenous BID   ramelteon   8 mg Oral QHS   sodium chloride  flush  3 mL Intravenous Q12H   tamsulosin   0.4 mg Oral QPC supper   Continuous Infusions:  feeding supplement (KATE [employer]MS STANDARD 1.4) Liquid 1,000 mL (12/19/24 0933)   PRN Meds:.acetaminophen , albuterol , dextromethorphan , EPINEPHrine , hydrOXYzine , [COMPLETED] loperamide  HCl **FOLLOWED BY** loperamide  HCl, methocarbamol , mupirocin  ointment, olopatadine , ondansetron , mouth rinse, oxyCODONE , phenol, sodium chloride  flush, traZODone   Current Labs: reviewed    Physical Exam:  Blood pressure (!) 134/59, pulse 69, temperature 98.1 F (36.7 C), temperature source Axillary, resp. rate (!) 21, height 6' (1.829 m), weight 104 kg, SpO2 95%. 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 Morphine should not be used.  Baclofen should be avoided Avoid oral sodium phosphate  and magnesium  citrate based laxatives / bowel preps    Bernardino Gasman MD 12/20/2024, 11:52 AM  Recent Labs  Lab 12/18/24 0515 12/19/24 0457 12/20/24 0430  NA 135 136 135  K 4.1 3.9 4.2  CL 98 97* 97*  CO2 24 26 26   GLUCOSE 204* 173* 136*  BUN 98* 93* 118*  CREATININE 3.65* 3.34* 3.77*  CALCIUM  9.0 8.9 9.0  PHOS 3.1 3.9 4.2   Recent Labs  Lab 12/17/24 0432 12/18/24 0515 12/19/24 0457  WBC 9.4 9.6 10.7*  HGB 8.6* 8.5* 8.4*  HCT 25.9* 24.9* 25.1*  MCV 90.9 89.2 90.9  PLT 183 193 224

## 2024-12-20 NOTE — Progress Notes (Addendum)
 Nutrition Follow-up  DOCUMENTATION CODES:  Non-severe (moderate) malnutrition in context of chronic illness, Obesity unspecified  INTERVENTION:  Continue TF via Cortrak: [place] 1.4 at 50 ml/h (1200 ml per day) Prosource TF20 decrease to 60 ml BID Provides 1840 kcal, 114 gm protein, 864 ml free water daily. Continue Juven 1 packet per tube BID, each packet provides 80 calories, 8 grams of carbohydrate, 2.5  grams of protein (collagen), 7 grams of L-arginine and 7 grams of L-glutamine; supplement contains CaHMB, Vitamins C, E, B12 and Zinc  to promote wound healing.  NUTRITION DIAGNOSIS:  Moderate Malnutrition related to chronic illness (non-healing wound) as evidenced by mild fat depletion, mild muscle depletion; ongoing.  GOAL:  Patient will meet greater than or equal to 90% of their needs; met with TF.  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  Spoke with patient and his wife at bedside. Patient states that he is doing okay and his stomach feels fine. He is not having any tolerance issues with the TF.  Current TF order: Mallie Pinion 1.4 at 50 ml/h with Prosource TF20 60 ml TID to provide 1920 kcal, 134 gm protein, 864 ml free water, 188 gm carbohydrates per day.   S/P MBS with SLP on 1/15. Patient remains NPO and is not ready for POs yet.   Received CRRT from 1/10 to 1/12. Now tolerating iHD on MWF schedule. Last HD 1/14. Admit weight: 103.4 kg (1/7) Current weight: 1.4 kg (1/16)  RD re-estimated protein needs now that patient is off CRRT. Will decrease Prosource to BID.  Labs reviewed.  K 4.2 WNL Phos 4.2 WNL CBG: [phone]  Medications reviewed and include acidophilus, vitamin C , ferrous sulfate , novolog ,  lantus , MVI with minerals, Juven, protonix , flomax .  Diet Order:   Diet Order             Diet NPO time specified Except for: Ice Chips, Other (See Comments)  Diet effective now                   EDUCATION NEEDS:  Education needs have been addressed  Skin:  Skin Assessment: Skin Integrity Issues: Skin Integrity Issues:: Wound VAC Wound Vac: left leg  Last BM:  1/16 600 ml via FMS  Height:  Ht Readings from Last 1 Encounters:  12/11/24 6' (1.829 m)   Weight:  Wt Readings from Last 1 Encounters:  12/20/24 104 kg   Ideal Body Weight:  76.1 kg (adjusted for L transtibial amputation)  Estimated Nutritional Needs:  Kcal:  1700-2000 Protein:  110-130 gm Fluid:  1L + [name]   Suzen HUNT RD, LDN, CNSC Contact via secure chat. If unavailable, use group chat RD Inpatient.

## 2024-12-20 NOTE — Plan of Care (Signed)
" °  Problem: Cardiac: Goal: Ability to maintain an adequate cardiac output Outcome: Progressing Goal: Will show no evidence of cardiac arrhythmias Outcome: Progressing   Problem: Respiratory: Goal: Respiratory status will improve Outcome: Progressing   Problem: Clinical Measurements: Goal: Respiratory complications will improve Outcome: Progressing Goal: Cardiovascular complication will be avoided Outcome: Progressing   Problem: Coping: Goal: Level of anxiety will decrease Outcome: Progressing   Problem: Safety: Goal: Ability to remain free from injury will improve Outcome: Progressing   "

## 2024-12-21 ENCOUNTER — Inpatient Hospital Stay (HOSPITAL_COMMUNITY)

## 2024-12-21 DIAGNOSIS — G9340 Encephalopathy, unspecified: Secondary | ICD-10-CM | POA: Diagnosis not present

## 2024-12-21 DIAGNOSIS — I5022 Chronic systolic (congestive) heart failure: Secondary | ICD-10-CM | POA: Diagnosis not present

## 2024-12-21 DIAGNOSIS — E440 Moderate protein-calorie malnutrition: Secondary | ICD-10-CM | POA: Diagnosis not present

## 2024-12-21 DIAGNOSIS — N179 Acute kidney failure, unspecified: Secondary | ICD-10-CM | POA: Diagnosis not present

## 2024-12-21 LAB — RENAL FUNCTION PANEL
Albumin: 3.3 g/dL — ABNORMAL LOW (ref 3.5–5.0)
Anion gap: 11 (ref 5–15)
BUN: 71 mg/dL — ABNORMAL HIGH (ref 8–23)
CO2: 29 mmol/L (ref 22–32)
Calcium: 9 mg/dL (ref 8.9–10.3)
Chloride: 96 mmol/L — ABNORMAL LOW (ref 98–111)
Creatinine, Ser: 2.28 mg/dL — ABNORMAL HIGH (ref 0.61–1.24)
GFR, Estimated: 28 mL/min — ABNORMAL LOW
Glucose, Bld: 210 mg/dL — ABNORMAL HIGH (ref 70–99)
Phosphorus: 2.9 mg/dL (ref 2.5–4.6)
Potassium: 4.2 mmol/L (ref 3.5–5.1)
Sodium: 135 mmol/L (ref 135–145)

## 2024-12-21 LAB — GLUCOSE, CAPILLARY
Glucose-Capillary: 155 mg/dL — ABNORMAL HIGH (ref 70–99)
Glucose-Capillary: 179 mg/dL — ABNORMAL HIGH (ref 70–99)
Glucose-Capillary: 198 mg/dL — ABNORMAL HIGH (ref 70–99)
Glucose-Capillary: 206 mg/dL — ABNORMAL HIGH (ref 70–99)
Glucose-Capillary: 207 mg/dL — ABNORMAL HIGH (ref 70–99)
Glucose-Capillary: 212 mg/dL — ABNORMAL HIGH (ref 70–99)

## 2024-12-21 LAB — MAGNESIUM: Magnesium: 2.2 mg/dL (ref 1.7–2.4)

## 2024-12-21 MED ORDER — HALOPERIDOL LACTATE 5 MG/ML IJ SOLN: 2.0000 mg | Freq: Four times a day (QID) | INTRAMUSCULAR | Status: DC | PRN

## 2024-12-21 MED ORDER — HALOPERIDOL LACTATE 5 MG/ML IJ SOLN
5.0000 mg | Freq: Once | INTRAMUSCULAR | Status: AC
  Administered 2024-12-21: 5 mg via INTRAVENOUS
  Filled 2024-12-21: qty 1

## 2024-12-21 NOTE — Progress Notes (Signed)
 "  [facility] PROGRESS NOTE   Shane [name] FMW:[id number] DOB: 16-Aug-1941 DOA: 12/11/2024  PCP: Shepard Ade, MD  Brief History: 84 year old male with past medical history significant for chronic kidney disease stage III, diabetes, hypertension, hyperlipidemia, IPF, OSA on CPAP and at bedtime 2L [place], and HFrEF with LVEF 30 to 35%. He presented to [facility] 1/7 after failing conservative outpatient therapy for ischemic lower extremity ulcers and left foot with gangrene. He underwent transtibial amputation and was subsequently sent to the medical floor for recovery. In the early a.m. hours 1/8 he became hypotensive which did improve temporarily with crystalloid bolus, however, he again became hypotensive with systolic blood pressures in the 70s and 80s.  He was subsequently transferred to the ICU.  He was stabilized.  He was also noted to be in acute kidney injury.  Nephrology was consulted.  He underwent CRRT.  Stabilized and then transferred to the floor.  Consultants: Critical care medicine.  Nephrology.  Orthopedics.  Procedures: Left transtibial amputation 1/7   Subjective/Interval History: Overnight events noted.  Patient was quite agitated earlier this morning.  Calmed down after he was given Haldol .  Seems appropriate this morning though slightly anxious.  His wife is at the bedside.  Subsequently he started complaining of some shortness of breath.  He was reexamined.  Chest x-ray and EKG ordered.  Vitals are stable.    Assessment/Plan:  Septic shock Secondary to left foot gangrene.  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 Patient was in the ICU.  Required pressors.  Patient was stabilized. Patient was started on midodrine .  Blood pressures have improved with occasional high readings.  Midodrine  dose was decreased to 5 mg every 8 hours.  Blood pressure holding steady.  Maintain current dose for now. He has completed course of Zosyn .  He was on linezolid   previously as well.   Acute kidney injury on chronic kidney disease stage IIIb Patient had worsening in his renal function.  Nephrology was consulted.  Patient was started on CRRT.   Plan is for intermittent hemodialysis going forward.   Urine output has increased in the last 48 hours.  Could be encouraging sign. Further management per nephrology. Will ask nephrology if his Foley catheter can be discontinued.  Acute metabolic encephalopathy/hospital delirium Remains encephalopathic though appears to show some improvement.  Continue to engage with patient on a frequent basis.  No focal neurological deficits noted.   Sleep cycle is still reversed.  Continue Rozerem  for now.  Melatonin was discontinued.   Required Haldol  overnight.  May need additional doses this morning.  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  Continue SSI and glargine. HbA1c 7.6.  Chronic systolic CHF EF is 30 to 35% based on echocardiogram done on 12/12/2024. Cardiac status stable for the most part.  Volume being managed with hemodialysis.  Paroxysmal atrial fibrillation Eliquis  being continued.   Currently not on any rate limiting drugs due to his recent septic shock.  He is requiring midodrine  for blood pressure support.  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  Underwent left transtibial amputation during this hospitalization.   Normocytic anemia Likely anemia of chronic disease.  No evidence of overt bleeding.  Hemoglobin remains stable.  No need to check on a daily basis.  Oropharyngeal dysphagia/moderate protein calorie malnutrition Has a cortrak feeding tube.  Speech therapy is following.  Hopefully as his mentation improves we will  be able to start him on oral diet.  Underwent modified barium swallow 1/15 and unfortunately diet cannot be advanced quite yet.   Patient and wife mentions that at baseline he has always  had some degree of difficulty swallowing which he attributes to his cervical spine issues.  Diarrhea Has had profuse stool output presumably from his tube feedings.  He was given Imodium .  Flexi-Seal had to be inserted.  Nursing staff to see how much output he has today and then consider removing the Flexi-Seal later today.  Foley catheter. This was placed during this hospitalization.  Initiated on Flomax .  Will ask nephrology if Foley catheter can be discontinued.    History of sleep apnea Resume CPAP once feeding tube has been discontinued.  DVT Prophylaxis: On apixaban  Code Status: Full code Family Communication: Discussed with his wife Disposition Plan: CIR is being considered    Medications: Scheduled:  acidophilus  1 capsule Oral Daily   apixaban   2.5 mg Per Tube BID   vitamin C   1,000 mg Per Tube Daily   atorvastatin   80 mg Per Tube Daily   Chlorhexidine  Gluconate Cloth  6 each Topical Q0600   feeding supplement (PROSource TF20)  60 mL Per Tube BID   ferrous sulfate   300 mg Per Tube Q breakfast   gabapentin   100 mg Per Tube QHS   insulin  aspart  0-15 Units Subcutaneous Q4H   insulin  aspart  10 Units Subcutaneous Q4H   insulin  glargine  22 Units Subcutaneous BID   midodrine   5 mg Per Tube Q8H   multivitamin with minerals  1 tablet Per Tube q AM   nutrition supplement (JUVEN)  1 packet Per Tube BID BM   mouth rinse  15 mL Mouth Rinse 4 times per day   pantoprazole  (PROTONIX ) IV  40 mg Intravenous BID   ramelteon   8 mg Oral QHS   sodium chloride  flush  3 mL Intravenous Q12H   tamsulosin   0.4 mg Oral QPC supper   Continuous:  feeding supplement (KATE FARMS STANDARD 1.4) Liquid 1,000 mL (12/19/24 0933)   PRN:acetaminophen , albuterol , dextromethorphan , EPINEPHrine , hydrOXYzine , [COMPLETED] loperamide  HCl **FOLLOWED BY** loperamide  HCl, methocarbamol , mupirocin  ointment, olopatadine , ondansetron , mouth rinse, oxyCODONE , phenol, sodium chloride  flush,  traZODone   Objective:  Vital Signs  Vitals:   12/20/24 2346 12/21/24 0004 12/21/24 0419 12/21/24 0727  BP: (!) 128/94 122/63 133/61 (!) 116/52  Pulse: 85 85  64  Resp: 15 16  16   Temp:  98.4 F (36.9 C) 97.9 F (36.6 C) (!) 97 F (36.1 C)  TempSrc:  Oral Oral Axillary  SpO2: 96% 100%  97%  Weight: 107.2 kg  106.5 kg   Height:        Intake/Output Summary (Last 24 hours) at 12/21/2024 0930 Last data filed at 12/20/2024 2346 Gross per 24 hour  Intake --  Output 3050 ml  Net -3050 ml   Filed Weights   12/20/24 2021 12/20/24 2346 12/21/24 0419  Weight: 108.6 kg 107.2 kg 106.5 kg   General appearance: Awake alert.  In no distress.  Distracted Resp: Clear to auscultation bilaterally.  Normal effort Cardio: S1-S2 is normal regular.  No S3-S4.  No rubs murmurs or bruit GI: Abdomen is soft.  Nontender nondistended.  Bowel sounds are present normal.  No masses organomegaly   Lab Results:  Data Reviewed: I have personally reviewed following labs and reports of the imaging studies  CBC: Recent Labs  Lab 12/16/24 0218 12/17/24 0432 12/18/24 0515 12/19/24 0457  WBC 11.5* 9.4 9.6 10.7*  HGB 8.4* 8.6* 8.5* 8.4*  HCT 25.2* 25.9* 24.9* 25.1*  MCV 92.3 90.9 89.2 90.9  PLT 164 183 193 224    Basic Metabolic Panel: Recent Labs  Lab 12/17/24 0432 12/18/24 0515 12/19/24 0457 12/20/24 0430 12/21/24 0511  NA 136 135 136 135 135  K 4.2 4.1 3.9 4.2 4.2  CL 100 98 97* 97* 96*  CO2 27 24 26 26 29   GLUCOSE 205* 204* 173* 136* 210*  BUN 57* 98* 93* 118* 71*  CREATININE 2.17* 3.65* 3.34* 3.77* 2.28*  CALCIUM  8.9 9.0 8.9 9.0 9.0  MG 2.5* 2.7* 2.6* 2.6* 2.2  PHOS 2.9 3.1 3.9 4.2 2.9    GFR: Estimated Creatinine Clearance: 31 mL/min (A) (by C-G formula based on SCr of 2.28 mg/dL (H)).  Liver Function Tests: Recent Labs  Lab 12/17/24 0432 12/18/24 0515 12/19/24 0457 12/20/24 0430 12/21/24 0511  ALBUMIN  3.3* 3.2* 3.1* 3.1* 3.3*    CBG: Recent Labs  Lab  12/20/24 1152 12/20/24 1606 12/21/24 0007 12/21/24 0421 12/21/24 0725  GLUCAP 176* 174* 155* 179* 207*     Recent Results (from the past 240 hours)  MRSA Next Gen by PCR, Nasal     Status: None   Collection Time: 12/12/24  2:03 PM   Specimen: Nasal Mucosa; Nasal Swab  Result Value Ref Range Status   MRSA by PCR Next Gen NOT DETECTED NOT DETECTED Final    Comment: (NOTE) The GeneXpert MRSA Assay (FDA approved for NASAL specimens only), is one component of a comprehensive MRSA colonization surveillance program. It is not intended to diagnose MRSA infection nor to guide or monitor treatment for MRSA infections. Test performance is not FDA approved in patients less than 90 years old. Performed at [facility], [address], [place], [place] [address]   Culture, blood (Routine X 2) w Reflex to ID Panel     Status: None   Collection Time: 12/13/24 11:15 AM   Specimen: BLOOD  Result Value Ref Range Status   Specimen Description BLOOD SITE NOT SPECIFIED  Final   Special Requests   Final    BOTTLES DRAWN AEROBIC AND ANAEROBIC Blood Culture results may not be optimal due to an inadequate volume of blood received in culture bottles   Culture   Final    NO GROWTH 5 DAYS Performed at [facility], [address], [place], [place] [address]    Report Status 12/18/2024 FINAL  Final  Culture, blood (Routine X 2) w Reflex to ID Panel     Status: None   Collection Time: 12/13/24 11:15 AM   Specimen: BLOOD  Result Value Ref Range Status   Specimen Description BLOOD SITE NOT SPECIFIED  Final   Special Requests   Final    BOTTLES DRAWN AEROBIC AND ANAEROBIC Blood Culture adequate volume   Culture   Final    NO GROWTH 5 DAYS Performed at [facility], [address], [place], [place] [address]    Report Status 12/18/2024 FINAL  Final      Radiology Studies: DG Swallowing Func-Speech Pathology Result Date: 12/19/2024 Table formatting from the original result was not  included. Modified Barium Swallow Study Patient Details Name: Shane [name] MRN: [id number] Date of Birth: 01/02/1941 Today's Date: 12/19/2024 HPI/PMH: HPI: 84 yo male admitted 1/7 for L transtibial amputation. Transferred to ICU 1/8 for hypotension. CRRT started 1/10. Intermittent dialysis started 1/14. Seen by SLP 12/2020 and 12/2023 with  concern for primary esophageal dysphagia, complaining of difficulty with solids>liquids and episodes of regurgitation. Declined an MBS and although recommended, esophageal assessment was not been completed. PMH: CKD III, DM, HTN, HLD, IPF, OSA on CPAP, HFrEF with LVEF 30-35% Clinical Impression: Clinical Impression: Pt has an oropharyngeal dysphagia that is likely exacerbated by mentation. He is not yet ready for a PO diet. Would continue using temporary, alternative means of nutrition with ongoing time to rebuild strength and allow for mentation to improve. Pt is more lethargic this afternoon compared to this morning, still waking up for PO trials though. He has minimal oral residue that he typically clears with a second swallow. Pharyngeally he has reduced base of tongue retraction and pharyngeal squeeze, contributing to incomplete epiglottic inversion (almost no inversion with smaller, lighter boluses) and vallecular residue. Presence of cervical hardware may also contribute from a structural standpoint. Pt has incomplete laryngeal vestibule closure and deep penetration to the vocal folds occurs with thin liquids, as well as more shallow penetration with thicker liquids. Penetration also occurs after the swallow with thicker liquids from residuals, also likely to have mixed with residue from solid trials. Across the course of the study pt inconsistently coughs and clears his throat but can never clear the penetrates, and they ultimately spill below his vocal folds to become aspiration. Factors that may increase risk of adverse event in presence of aspiration [name] & [name]  2021): Factors that may increase risk of adverse event in presence of aspiration [name]: Respiratory or GI disease; Reduced cognitive function; Limited mobility; Frail or deconditioned; Weak cough; Aspiration of thick, dense, and/or acidic materials Recommendations/Plan: Swallowing Evaluation Recommendations Swallowing Evaluation Recommendations Recommendations: NPO; Alternative means of nutrition - NG Tube; Ice chips PRN after oral care (limited amounts of ice chips after oral care) Medication Administration: Via alternative means Oral care recommendations: Oral care QID (4x/day); Oral care before ice chips/water Caregiver Recommendations: Have oral suction available Treatment Plan Treatment Plan Treatment recommendations: Therapy as outlined in treatment plan below Follow-up recommendations: Acute inpatient rehab (3 hours/day) Functional status assessment: Patient has had a recent decline in their functional status and demonstrates the ability to make significant improvements in function in a reasonable and predictable amount of time. Treatment frequency: Min 2x/week Treatment duration: 2 weeks Interventions: Aspiration precaution training; Patient/family education; Trials of upgraded texture/liquids; Respiratory muscle strength training; Oropharyngeal exercises; Compensatory techniques Recommendations Recommendations for follow up therapy are one component of a multi-disciplinary discharge planning process, led by the attending physician.  Recommendations may be updated based on patient status, additional functional criteria and insurance authorization. Assessment: Orofacial Exam: Orofacial Exam Oral Cavity - Dentition: Adequate natural dentition; Missing dentition Anatomy: Anatomy: Presence of cervical hardware Boluses Administered: Boluses Administered Boluses Administered: Thin liquids (Level 0); Mildly thick liquids (Level 2, nectar thick); Moderately thick liquids (Level 3, honey thick);  Puree; Solid  Oral Impairment Domain: Oral Impairment Domain Lip Closure: -- (not well visualized on imaging but no overt loss noted clinically) Tongue control during bolus hold: Posterior escape of greater than half of bolus Bolus preparation/mastication: Timely and efficient chewing and mashing Bolus transport/lingual motion: Brisk tongue motion Oral residue: Residue collection on oral structures Location of oral residue : Tongue Initiation of pharyngeal swallow : Valleculae  Pharyngeal Impairment Domain: Pharyngeal Impairment Domain Soft palate elevation: No bolus between soft palate (SP)/pharyngeal wall (PW) Laryngeal elevation: Complete superior movement of thyroid cartilage with complete approximation of arytenoids to epiglottic petiole Anterior hyoid excursion: Complete anterior  movement Epiglottic movement: No inversion Laryngeal vestibule closure: Incomplete, narrow column air/contrast in laryngeal vestibule Pharyngeal stripping wave : Present - diminished Pharyngeal contraction (A/P view only): N/A Pharyngoesophageal segment opening: Partial distention/partial duration, partial obstruction of flow Tongue base retraction: Narrow column of contrast or air between tongue base and PPW Pharyngeal residue: Collection of residue within or on pharyngeal structures Location of pharyngeal residue: Valleculae  Esophageal Impairment Domain: No data recorded Pill: No data recorded Penetration/Aspiration Scale Score: Penetration/Aspiration Scale Score 8.  Material enters airway, passes BELOW cords without attempt by patient to eject out (silent aspiration) : Thin liquids (Level 0); Mildly thick liquids (Level 2, nectar thick); Moderately thick liquids (Level 3, honey thick) Compensatory Strategies: No data recorded  General Information: Caregiver present: No  Diet Prior to this Study: NPO; Cortrak/Small bore NG tube   Temperature : Normal   Respiratory Status: WFL   Supplemental O2: Nasal cannula   History of Recent  Intubation: No  Behavior/Cognition: Lethargic/Drowsy; Requires cueing Self-Feeding Abilities: Needs assist with self-feeding Baseline vocal quality/speech: Normal Volitional Cough: Able to elicit Volitional Swallow: Able to elicit Exam Limitations: Fatigue Goal Planning: Prognosis for improved oropharyngeal function: Good Barriers to Reach Goals: Cognitive deficits No data recorded Patient/Family Stated Goal: none stated Consulted and agree with results and recommendations: Patient Pain: Pain Assessment Pain Assessment: Faces Faces Pain Scale: 0 Pain Intervention(s): Limited activity within patient's tolerance; Monitored during session End of Session: Start Time:SLP Start Time (ACUTE ONLY): 1518 Stop Time: SLP Stop Time (ACUTE ONLY): 1537 Time Calculation:SLP Time Calculation (min) (ACUTE ONLY): 19 min Charges: SLP Evaluations $ SLP Speech Visit: 1 Visit SLP Evaluations $MBS Swallow: 1 Procedure $Swallowing Treatment: 1 Procedure SLP visit diagnosis: SLP Visit Diagnosis: Dysphagia, oropharyngeal phase (R13.12) Past Medical History: Past Medical History: Diagnosis Date  Acute blood loss anemia   Anxiety   AV block, Mobitz 1   Cataract   Chronic kidney disease   d/t DM  CKD (chronic kidney disease), stage III (HCC)   COVID-19 virus infection 07/18/2020  Last Assessment & Plan:   Formatting of this note might be different from the original.  Immunocompromise high risk patient  -ID consulted for further therapies and will administer regeneron as OP tomorrow   -Decadron  discontinued as patient is off O2    Depression   Diabetes mellitus   Vgo disposal insulin  bolus  simular to insulin  pump  Dyspnea   GERD (gastroesophageal reflux disease)   History of kidney stones   passed  Hyperlipidemia   Hypertension   Idiopathic pulmonary fibrosis (HCC) 11/2016  ILD (interstitial lung disease) (HCC)   Moderate aortic stenosis   a. 10/2019 Echo: EF 55-60%, Gr2 DD. Nl RV.   Neuromuscular disorder (HCC)   Neuropathy associated with  endocrine disorder   Nonobstructive CAD (coronary artery disease)   a. 2012 Cath: mod, nonobs dzs; b. 10/2016 MV: EF 60%, no ischemia.  OSA on CPAP 05/05/2017  Unattended Home Sleep Test 7/2/813-AHI 38.6/hour, desaturation to 64%, body weight 261 pounds  PONV (postoperative nausea and vomiting)   Postoperative anemia due to acute blood loss 11/07/2020  Postoperative hemorrhagic shock 03/20/2021  Sleep apnea    uses cpap asked to bring mask and tubing Past Surgical History: Past Surgical History: Procedure Laterality Date  ABDOMINAL AORTOGRAM W/LOWER EXTREMITY N/A 12/10/2020  Procedure: ABDOMINAL AORTOGRAM W/LOWER EXTREMITY;  Surgeon: Magda Debby SAILOR, MD;  Location: [facility] INVASIVE CV LAB;  Service: Cardiovascular;  Laterality: N/A;  AMPUTATION Right 01/22/2021  Procedure: RIGHT 5TH RAY  AMPUTATION;  Surgeon: Harden Jerona GAILS, MD;  Location: [facility] OR;  Service: Orthopedics;  Laterality: Right;  AMPUTATION Right 03/17/2021  Procedure: RIGHT BELOW KNEE AMPUTATION;  Surgeon: Harden Jerona GAILS, MD;  Location: [facility] OR;  Service: Orthopedics;  Laterality: Right;  AMPUTATION Left 12/11/2024  Procedure: AMPUTATION BELOW KNEE;  Surgeon: Harden Jerona GAILS, MD;  Location: [facility] OR;  Service: Orthopedics;  Laterality: Left;  ANKLE FUSION Right 01/22/2021  Procedure: RIGHT FOOT TIBIOCALCANEAL FUSION;  Surgeon: Harden Jerona GAILS, MD;  Location: [facility] OR;  Service: Orthopedics;  Laterality: Right;  ANTERIOR FUSION CERVICAL SPINE  2012  APPLICATION OF WOUND VAC Left 12/11/2024  Procedure: APPLICATION, WOUND VAC;  Surgeon: Harden Jerona GAILS, MD;  Location: [facility] OR;  Service: Orthopedics;  Laterality: Left;  CARDIAC CATHETERIZATION  2011  CARDIAC CATHETERIZATION N/A 11/09/2016  Procedure: Right Heart Cath;  Surgeon: Victory LELON Sharps, MD;  Location: [facility] INVASIVE CV LAB;  Service: Cardiovascular;  Laterality: N/A;  carpel tunnel    left wrist  CATARACT EXTRACTION    CATARACT EXTRACTION W/ INTRAOCULAR LENS  IMPLANT, BILATERAL  2013  CERVICAL LAMINECTOMY  2012  COLONOSCOPY N/A  01/14/2013  Procedure: COLONOSCOPY;  Surgeon: Norleen LOISE Kiang, MD;  Location: [facility] ENDOSCOPY;  Service: Endoscopy;  Laterality: N/A;  CORONARY ARTERY BYPASS GRAFT  11/04/2020  LIMA-LAD, SVG-OM1, SVG-PDA (Dr Norleen Exon [facility]) dc 11/18/2020  EYE SURGERY    I & D EXTREMITY Right 02/19/2021  Procedure: RIGHT ANKLE DEBRIDEMENT AND PLACEMENT ANTIBIOTIC BEADS;  Surgeon: Harden Jerona GAILS, MD;  Location: [facility] OR;  Service: Orthopedics;  Laterality: Right;  KNEE SURGERY  1998  left  LEFT HEART CATH AND CORONARY ANGIOGRAPHY N/A 07/10/2020  Procedure: LEFT HEART CATH AND CORONARY ANGIOGRAPHY;  Surgeon: Wonda Sharper, MD;  Location: [facility] INVASIVE CV LAB;  Service: Cardiovascular;  Laterality: N/A;  LUMBAR LAMINECTOMY  2003  LUNG BIOPSY Left 12/26/2016  Procedure: LUNG BIOPSY;  Surgeon: Elspeth JAYSON Millers, MD;  Location: [facility] OR;  Service: Thoracic;  Laterality: Left;  PACEMAKER IMPLANT N/A 03/30/2020  Procedure: PACEMAKER IMPLANT;  Surgeon: Waddell Danelle LELON, MD;  Location: [facility] INVASIVE CV LAB;  Service: Cardiovascular;  Laterality: N/A;  PERIPHERAL VASCULAR INTERVENTION Right 12/10/2020  Procedure: PERIPHERAL VASCULAR INTERVENTION;  Surgeon: Magda Debby LOISE, MD;  Location: [facility] INVASIVE CV LAB;  Service: Cardiovascular;  Laterality: Right;  SFA  POSTERIOR FUSION CERVICAL SPINE  2012  PPM GENERATOR REMOVAL N/A 12/14/2020  Procedure: PPM GENERATOR REMOVAL;  Surgeon: Waddell Danelle LELON, MD;  Location: [facility] INVASIVE CV LAB;  Service: Cardiovascular;  Laterality: N/A;  TEE WITHOUT CARDIOVERSION N/A 12/11/2020  Procedure: TRANSESOPHAGEAL ECHOCARDIOGRAM (TEE);  Surgeon: Barbaraann Darryle Debby, MD;  Location: [facility] ENDOSCOPY;  Service: Cardiovascular;  Laterality: N/A;  TRIGGER FINGER RELEASE  2011  4th finger left hand  VIDEO ASSISTED THORACOSCOPY Left 12/26/2016  Procedure: VIDEO ASSISTED THORACOSCOPY;  Surgeon: Elspeth JAYSON Millers, MD;  Location: [facility] OR;  Service: Thoracic;  Laterality: Left;  VIDEO BRONCHOSCOPY N/A 12/26/2016  Procedure: VIDEO BRONCHOSCOPY;  Surgeon:  Elspeth JAYSON Millers, MD;  Location: [facility] OR;  Service: Thoracic;  Laterality: N/A; [name] LOISE., M.A. CCC-SLP Acute Rehabilitation Services Office: [phone] Secure chat preferred 12/19/2024, 4:26 PM      LOS: 10 days   Khalid Lacko Verdene  [facility] Pager on [email or web address]  12/21/2024, 9:30 AM   "

## 2024-12-21 NOTE — Progress Notes (Signed)
 Admit: 12/11/2024 LOS: 10   38M dialysis dependent AKI on CKD 3, status post left BKA, resolving septic shock  Subjective:  No new events, tolerated dialysis yesterday with 1.6 L UF Made 0.9 L urine output yesterday  01/16 0701 - 01/17 0700 In: -  Out: 3050 [Urine:850; Stool:600]  Filed Weights   12/20/24 2021 12/20/24 2346 12/21/24 0419  Weight: 108.6 kg 107.2 kg 106.5 kg    Scheduled Meds:  acidophilus  1 capsule Oral Daily   apixaban   2.5 mg Per Tube BID   vitamin C   1,000 mg Per Tube Daily   atorvastatin   80 mg Per Tube Daily   Chlorhexidine  Gluconate Cloth  6 each Topical Q0600   feeding supplement (PROSource TF20)  60 mL Per Tube BID   ferrous sulfate   300 mg Per Tube Q breakfast   gabapentin   100 mg Per Tube QHS   insulin  aspart  0-15 Units Subcutaneous Q4H   insulin  aspart  10 Units Subcutaneous Q4H   insulin  glargine  22 Units Subcutaneous BID   midodrine   5 mg Per Tube Q8H   multivitamin with minerals  1 tablet Per Tube q AM   nutrition supplement (JUVEN)  1 packet Per Tube BID BM   mouth rinse  15 mL Mouth Rinse 4 times per day   pantoprazole  (PROTONIX ) IV  40 mg Intravenous BID   ramelteon   8 mg Oral QHS   sodium chloride  flush  3 mL Intravenous Q12H   tamsulosin   0.4 mg Oral QPC supper   Continuous Infusions:  feeding supplement (KATE FARMS STANDARD 1.4) Liquid 1,000 mL (12/19/24 0933)   PRN Meds:.acetaminophen , albuterol , dextromethorphan , EPINEPHrine , haloperidol  lactate, hydrOXYzine , [COMPLETED] loperamide  HCl **FOLLOWED BY** loperamide  HCl, methocarbamol , mupirocin  ointment, olopatadine , ondansetron , mouth rinse, oxyCODONE , phenol, sodium chloride  flush, traZODone   Current Labs: reviewed    Physical Exam:  Blood pressure (!) 116/52, pulse 64, temperature (!) 97 F (36.1 C), temperature source Axillary, resp. rate 16, height 6' (1.829 m), weight 106.5 kg, SpO2 97%. 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 Morphine should not be used.  Baclofen should be avoided Avoid oral sodium phosphate  and magnesium  citrate based laxatives / bowel preps    Bernardino Gasman MD 12/21/2024, 10:31 AM  Recent Labs  Lab 12/19/24 0457 12/20/24 0430 12/21/24 0511  NA 136 135 135  K 3.9 4.2 4.2  CL 97* 97* 96*  CO2 26 26 29   GLUCOSE 173* 136* 210*  BUN 93* 118* 71*  CREATININE 3.34* 3.77* 2.28*  CALCIUM  8.9 9.0 9.0  PHOS 3.9 4.2 2.9   Recent Labs  Lab 12/17/24 0432 12/18/24 0515 12/19/24 0457  WBC 9.4 9.6 10.7*  HGB 8.6* 8.5* 8.4*  HCT 25.9* 24.9* 25.1*  MCV 90.9 89.2 90.9  PLT 183 193 224

## 2024-12-21 NOTE — Progress Notes (Signed)
 Patient ID: Shane [name], male   DOB: 09-24-1941, 84 y.o.   MRN: [id number] Patient is seen in follow-up status post transtibial amputation.  There is 150 cc in the wound VAC canister.  Discussed the importance of getting out of bed to a chair daily.  Will discontinue the wound VAC at time of discharge.
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
## 2024-12-21 NOTE — Plan of Care (Signed)
?  Problem: Clinical Measurements: ?Goal: Respiratory complications will improve ?Outcome: Progressing ?Goal: Cardiovascular complication will be avoided ?Outcome: Progressing ?  ?Problem: Nutrition: ?Goal: Adequate nutrition will be maintained ?Outcome: Progressing ?  ?Problem: Safety: ?Goal: Ability to remain free from injury will improve ?Outcome: Progressing ?  ?

## 2024-12-22 DIAGNOSIS — E440 Moderate protein-calorie malnutrition: Secondary | ICD-10-CM | POA: Diagnosis not present

## 2024-12-22 DIAGNOSIS — G9340 Encephalopathy, unspecified: Secondary | ICD-10-CM | POA: Diagnosis not present

## 2024-12-22 LAB — RENAL FUNCTION PANEL
Albumin: 3.2 g/dL — ABNORMAL LOW (ref 3.5–5.0)
Anion gap: 10 (ref 5–15)
BUN: 102 mg/dL — ABNORMAL HIGH (ref 8–23)
CO2: 29 mmol/L (ref 22–32)
Calcium: 8.9 mg/dL (ref 8.9–10.3)
Chloride: 98 mmol/L (ref 98–111)
Creatinine, Ser: 2.74 mg/dL — ABNORMAL HIGH (ref 0.61–1.24)
GFR, Estimated: 22 mL/min — ABNORMAL LOW
Glucose, Bld: 155 mg/dL — ABNORMAL HIGH (ref 70–99)
Phosphorus: 3.9 mg/dL (ref 2.5–4.6)
Potassium: 4.7 mmol/L (ref 3.5–5.1)
Sodium: 137 mmol/L (ref 135–145)

## 2024-12-22 LAB — GLUCOSE, CAPILLARY
Glucose-Capillary: 129 mg/dL — ABNORMAL HIGH (ref 70–99)
Glucose-Capillary: 134 mg/dL — ABNORMAL HIGH (ref 70–99)
Glucose-Capillary: 138 mg/dL — ABNORMAL HIGH (ref 70–99)
Glucose-Capillary: 163 mg/dL — ABNORMAL HIGH (ref 70–99)
Glucose-Capillary: 166 mg/dL — ABNORMAL HIGH (ref 70–99)
Glucose-Capillary: 214 mg/dL — ABNORMAL HIGH (ref 70–99)

## 2024-12-22 LAB — MAGNESIUM: Magnesium: 2.4 mg/dL (ref 1.7–2.4)

## 2024-12-22 MED ORDER — OXYCODONE HCL 5 MG PO TABS
2.5000 mg | ORAL_TABLET | ORAL | Status: DC | PRN
  Administered 2024-12-23 – 2024-12-25 (×6): 2.5 mg
  Filled 2024-12-22 (×6): qty 1

## 2024-12-22 MED ORDER — ACETAMINOPHEN 160 MG/5ML PO SOLN
500.0000 mg | Freq: Three times a day (TID) | ORAL | Status: DC
  Administered 2024-12-22 – 2024-12-25 (×9): 500 mg
  Filled 2024-12-22 (×10): qty 20.3

## 2024-12-22 NOTE — Progress Notes (Signed)
 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 3, status post left BKA, resolving septic shock  Subjective:  No new events, more awake and alert this AM Made 0.5 L urine output yesterday using purewick  01/17 0701 - 01/18 0700 In: 0  Out: 1400 [Urine:500; Stool:900]  Filed Weights   12/20/24 2346 12/21/24 0419 12/22/24 0435  Weight: 107.2 kg 106.5 kg 100.3 kg    Scheduled Meds:  acetaminophen  (TYLENOL ) oral liquid 160 mg/5 mL  500 mg Per Tube TID   acidophilus  1 capsule Oral Daily   apixaban   2.5 mg Per Tube BID   vitamin C   1,000 mg Per Tube Daily   atorvastatin   80 mg Per Tube Daily   Chlorhexidine  Gluconate Cloth  6 each Topical Q0600   feeding supplement (PROSource TF20)  60 mL Per Tube BID   ferrous sulfate   300 mg Per Tube Q breakfast   gabapentin   100 mg Per Tube QHS   insulin  aspart  0-15 Units Subcutaneous Q4H   insulin  aspart  10 Units Subcutaneous Q4H   insulin  glargine  22 Units Subcutaneous BID   midodrine   5 mg Per Tube Q8H   multivitamin with minerals  1 tablet Per Tube q AM   nutrition supplement (JUVEN)  1 packet Per Tube BID BM   mouth rinse  15 mL Mouth Rinse 4 times per day   pantoprazole  (PROTONIX ) IV  40 mg Intravenous BID   ramelteon   8 mg Oral QHS   sodium chloride  flush  3 mL Intravenous Q12H   tamsulosin   0.4 mg Oral QPC supper   Continuous Infusions:  feeding supplement (KATE FARMS STANDARD 1.4) Liquid 1,000 mL (12/22/24 1019)   PRN Meds:.acetaminophen , albuterol , dextromethorphan , EPINEPHrine , haloperidol  lactate, hydrOXYzine , [COMPLETED] loperamide  HCl **FOLLOWED BY** loperamide  HCl, methocarbamol , mupirocin  ointment, olopatadine , ondansetron , mouth rinse, oxyCODONE , phenol, sodium chloride  flush, traZODone   Current Labs: reviewed    Physical Exam:  Blood pressure (!) 104/54, pulse 70, temperature 98.6 F (37 C), temperature source Oral, resp. rate 17, height 6' (1.829 m), weight 100.3 kg, SpO2 96%. 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 Morphine should not be used.  Baclofen should be avoided Avoid oral sodium phosphate  and magnesium  citrate based laxatives / bowel preps    Bernardino Gasman MD 12/22/2024, 11:47 AM  Recent Labs  Lab 12/20/24 0430 12/21/24 0511 12/22/24 0500  NA 135 135 137  K 4.2 4.2 4.7  CL 97* 96* 98  CO2 26 29 29   GLUCOSE 136* 210* 155*  BUN 118* 71* 102*  CREATININE 3.77* 2.28* 2.74*  CALCIUM  9.0 9.0 8.9  PHOS 4.2 2.9 3.9   Recent Labs  Lab 12/17/24 0432 12/18/24 0515 12/19/24 0457  WBC 9.4 9.6 10.7*  HGB 8.6* 8.5* 8.4*  HCT 25.9* 24.9*  25.1*  MCV 90.9 89.2 90.9  PLT 183 193 224

## 2024-12-22 NOTE — Plan of Care (Signed)
" °  Problem: Education: Goal: Knowledge of the prescribed therapeutic regimen will improve Outcome: Progressing   Problem: Bowel/Gastric: Goal: Gastrointestinal status for postoperative course will improve Outcome: Progressing   Problem: Cardiac: Goal: Ability to maintain an adequate cardiac output Outcome: Progressing Goal: Will show no evidence of cardiac arrhythmias Outcome: Progressing   Problem: Nutritional: Goal: Will attain and maintain optimal nutritional status Outcome: Progressing   Problem: Neurological: Goal: Will regain or maintain usual level of consciousness Outcome: Progressing   Problem: Clinical Measurements: Goal: Ability to maintain clinical measurements within normal limits Outcome: Progressing Goal: Postoperative complications will be avoided or minimized Outcome: Progressing   Problem: Respiratory: Goal: Will regain and/or maintain adequate ventilation Outcome: Progressing Goal: Respiratory status will improve Outcome: Progressing   Problem: Skin Integrity: Goal: Demonstrates signs of wound healing without infection Outcome: Progressing   Problem: Urinary Elimination: Goal: Will remain free from infection Outcome: Progressing Goal: Ability to achieve and maintain adequate urine output Outcome: Progressing   Problem: Education: Goal: Knowledge of General Education information will improve Description: Including pain rating scale, medication(s)/side effects and non-pharmacologic comfort measures Outcome: Progressing   Problem: Health Behavior/Discharge Planning: Goal: Ability to manage health-related needs will improve Outcome: Progressing   Problem: Coping: Goal: Level of anxiety will decrease Outcome: Progressing   Problem: Elimination: Goal: Will not experience complications related to bowel motility Outcome: Progressing Goal: Will not experience complications related to urinary retention Outcome: Progressing   Problem: Pain  Managment: Goal: General experience of comfort will improve and/or be controlled Outcome: Progressing   Problem: Safety: Goal: Ability to remain free from injury will improve Outcome: Progressing   Problem: Skin Integrity: Goal: Risk for impaired skin integrity will decrease Outcome: Progressing   Problem: Education: Goal: Ability to describe self-care measures that may prevent or decrease complications (Diabetes Survival Skills Education) will improve Outcome: Progressing Goal: Individualized Educational Video(s) Outcome: Progressing   Problem: Coping: Goal: Ability to adjust to condition or change in health will improve Outcome: Progressing   Problem: Fluid Volume: Goal: Ability to maintain a balanced intake and output will improve Outcome: Progressing   Problem: Health Behavior/Discharge Planning: Goal: Ability to identify and utilize available resources and services will improve Outcome: Progressing Goal: Ability to manage health-related needs will improve Outcome: Progressing   Problem: Metabolic: Goal: Ability to maintain appropriate glucose levels will improve Outcome: Progressing   Problem: Nutritional: Goal: Maintenance of adequate nutrition will improve Outcome: Progressing Goal: Progress toward achieving an optimal weight will improve Outcome: Progressing   Problem: Skin Integrity: Goal: Risk for impaired skin integrity will decrease Outcome: Progressing   Problem: Tissue Perfusion: Goal: Adequacy of tissue perfusion will improve Outcome: Progressing   Problem: Education: Goal: Knowledge of the prescribed therapeutic regimen will improve Outcome: Progressing Goal: Ability to verbalize activity precautions or restrictions will improve Outcome: Progressing Goal: Understanding of discharge needs will improve Outcome: Progressing   Problem: Activity: Goal: Ability to perform//tolerate increased activity and mobilize with assistive devices will  improve Outcome: Progressing   Problem: Clinical Measurements: Goal: Postoperative complications will be avoided or minimized Outcome: Progressing   Problem: Self-Care: Goal: Ability to meet self-care needs will improve Outcome: Progressing   "

## 2024-12-22 NOTE — Progress Notes (Signed)
 "  [facility] PROGRESS NOTE   Shane [name] FMW:[id number] DOB: 1941-06-29 DOA: 12/11/2024  PCP: Shepard Ade, MD  Brief History: 84 year old male with past medical history significant for chronic kidney disease stage III, diabetes, hypertension, hyperlipidemia, IPF, OSA on CPAP and at bedtime 2L [place], and HFrEF with LVEF 30 to 35%. He presented to [facility] 1/7 after failing conservative outpatient therapy for ischemic lower extremity ulcers and left foot with gangrene. He underwent transtibial amputation and was subsequently sent to the medical floor for recovery. In the early a.m. hours 1/8 he became hypotensive which did improve temporarily with crystalloid bolus, however, he again became hypotensive with systolic blood pressures in the 70s and 80s.  He was subsequently transferred to the ICU.  He was stabilized.  He was also noted to be in acute kidney injury.  Nephrology was consulted.  He underwent CRRT.  Stabilized and then transferred to the floor.  Consultants: Critical care medicine.  Nephrology.  Orthopedics.  Procedures: Left transtibial amputation 1/7   Subjective/Interval History: Patient had a better night yesterday.  His wife mentioned that he managed to get several hours of sleep.  No agitation was noted.  His somnolent this morning because of the oxycodone  that was given earlier today for his pain.     Assessment/Plan:  Septic shock Secondary to left foot gangrene.  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 Patient was in the ICU.  Required pressors.  Patient was stabilized. Patient was started on midodrine .  Blood pressures have improved with occasional high readings.  Midodrine  dose was decreased to 5 mg every 8 hours.  Blood pressure holding steady.  Maintain current dose for now. He has completed course of Zosyn .  He was on linezolid  previously as well.   Acute kidney injury on chronic kidney disease stage IIIb Patient had worsening in his  renal function.  Nephrology was consulted.  Patient was started on CRRT.  Now on intermittent hemodialysis.  Urine output has picked up in the last few days. Further management per nephrology. Foley catheter was discontinued yesterday and he has been able to void via external catheter.  Acute metabolic encephalopathy/hospital delirium Remains encephalopathic though appears to show some improvement.  Continue to engage with patient on a frequent basis.  No focal neurological deficits noted.   Continue with Rozerem .  Haldol  as needed.  Seems to be gradually improving.  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  Continue SSI and glargine. HbA1c 7.6.  Chronic systolic CHF EF is 30 to 35% based on echocardiogram done on 12/12/2024. Cardiac status stable for the most part.  Volume being managed with hemodialysis.  Paroxysmal atrial fibrillation Eliquis  being continued.   Currently not on any rate limiting drugs due to his recent septic shock.  He is requiring midodrine  for blood pressure support.  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  Underwent left transtibial amputation during this hospitalization.  Orthopedics is following periodically.  Has a wound VAC but he does not need it at discharge. Tylenol  on a scheduled basis for pain control.  Avoid oxycodone  as much as possible.  Normocytic anemia Likely anemia of chronic disease.  No evidence of overt bleeding.  Hemoglobin remains stable.  No need to check on a daily basis.  Oropharyngeal dysphagia/moderate protein calorie malnutrition Has a cortrak feeding tube.  Speech therapy is following.  Hopefully as his mentation improves we will be  able to start him on oral diet.  Underwent modified barium swallow 1/15 and unfortunately diet cannot be advanced quite yet.   Patient and wife mentions that at baseline he has always had some degree of difficulty  swallowing which he attributes to his cervical spine issues. SLP to reassess tomorrow.  Diarrhea Has had profuse stool output presumably from his tube feedings.  He was given Imodium .  Flexi-Seal had to be inserted.  Still with significant output.  Continue Flexi-Seal for now.  Imodium  as needed.  History of sleep apnea Resume CPAP once feeding tube has been discontinued.  DVT Prophylaxis: On apixaban  Code Status: Full code Family Communication: Discussed with his wife Disposition Plan: CIR is being considered    Medications: Scheduled:  acetaminophen  (TYLENOL ) oral liquid 160 mg/5 mL  500 mg Per Tube TID   acidophilus  1 capsule Oral Daily   apixaban   2.5 mg Per Tube BID   vitamin C   1,000 mg Per Tube Daily   atorvastatin   80 mg Per Tube Daily   Chlorhexidine  Gluconate Cloth  6 each Topical Q0600   feeding supplement (PROSource TF20)  60 mL Per Tube BID   ferrous sulfate   300 mg Per Tube Q breakfast   gabapentin   100 mg Per Tube QHS   insulin  aspart  0-15 Units Subcutaneous Q4H   insulin  aspart  10 Units Subcutaneous Q4H   insulin  glargine  22 Units Subcutaneous BID   midodrine   5 mg Per Tube Q8H   multivitamin with minerals  1 tablet Per Tube q AM   nutrition supplement (JUVEN)  1 packet Per Tube BID BM   mouth rinse  15 mL Mouth Rinse 4 times per day   pantoprazole  (PROTONIX ) IV  40 mg Intravenous BID   ramelteon   8 mg Oral QHS   sodium chloride  flush  3 mL Intravenous Q12H   tamsulosin   0.4 mg Oral QPC supper   Continuous:  feeding supplement (KATE FARMS STANDARD 1.4) Liquid 1,000 mL (12/21/24 1322)   PRN:acetaminophen , albuterol , dextromethorphan , EPINEPHrine , haloperidol  lactate, hydrOXYzine , [COMPLETED] loperamide  HCl **FOLLOWED BY** loperamide  HCl, methocarbamol , mupirocin  ointment, olopatadine , ondansetron , mouth rinse, oxyCODONE , phenol, sodium chloride  flush, traZODone   Objective:  Vital Signs  Vitals:   12/21/24 1609 12/21/24 1900 12/22/24 0435 12/22/24  0748  BP: (!) 103/56 128/60 (!) 128/54 (!) 104/54  Pulse: 70  71 70  Resp: 16 16 16 17   Temp: 97.8 F (36.6 C) 98.5 F (36.9 C) (!) 97.5 F (36.4 C) 98.6 F (37 C)  TempSrc: Oral Oral Oral Oral  SpO2: 96%  94% 96%  Weight:   100.3 kg   Height:        Intake/Output Summary (Last 24 hours) at 12/22/2024 1014 Last data filed at 12/22/2024 0441 Gross per 24 hour  Intake 0 ml  Output 1400 ml  Net -1400 ml   Filed Weights   12/20/24 2346 12/21/24 0419 12/22/24 0435  Weight: 107.2 kg 106.5 kg 100.3 kg   General appearance: Awake alert.  In no distress.  Distracted Resp: Clear to auscultation bilaterally.  Normal effort Cardio: S1-S2 is normal regular.  No S3-S4.  No rubs murmurs or bruit GI: Abdomen is soft.  Nontender nondistended.  Bowel sounds are present normal.  No masses organomegaly Oriented to person place year month.  No focal neurological deficits.   Lab Results:  Data Reviewed: I have personally reviewed following labs and reports of the imaging studies  CBC: Recent Labs  Lab 12/16/24 0218 12/17/24  9567 12/18/24 0515 12/19/24 0457  WBC 11.5* 9.4 9.6 10.7*  HGB 8.4* 8.6* 8.5* 8.4*  HCT 25.2* 25.9* 24.9* 25.1*  MCV 92.3 90.9 89.2 90.9  PLT 164 183 193 224    Basic Metabolic Panel: Recent Labs  Lab 12/18/24 0515 12/19/24 0457 12/20/24 0430 12/21/24 0511 12/22/24 0500  NA 135 136 135 135 137  K 4.1 3.9 4.2 4.2 4.7  CL 98 97* 97* 96* 98  CO2 24 26 26 29 29   GLUCOSE 204* 173* 136* 210* 155*  BUN 98* 93* 118* 71* 102*  CREATININE 3.65* 3.34* 3.77* 2.28* 2.74*  CALCIUM  9.0 8.9 9.0 9.0 8.9  MG 2.7* 2.6* 2.6* 2.2 2.4  PHOS 3.1 3.9 4.2 2.9 3.9    GFR: Estimated Creatinine Clearance: 25.1 mL/min (A) (by C-G formula based on SCr of 2.74 mg/dL (H)).  Liver Function Tests: Recent Labs  Lab 12/18/24 0515 12/19/24 0457 12/20/24 0430 12/21/24 0511 12/22/24 0500  ALBUMIN  3.2* 3.1* 3.1* 3.3* 3.2*    CBG: Recent Labs  Lab 12/21/24 1606  12/21/24 2127 12/22/24 0037 12/22/24 0436 12/22/24 0745  GLUCAP 198* 206* 214* 129* 134*     Recent Results (from the past 240 hours)  MRSA Next Gen by PCR, Nasal     Status: None   Collection Time: 12/12/24  2:03 PM   Specimen: Nasal Mucosa; Nasal Swab  Result Value Ref Range Status   MRSA by PCR Next Gen NOT DETECTED NOT DETECTED Final    Comment: (NOTE) The GeneXpert MRSA Assay (FDA approved for NASAL specimens only), is one component of a comprehensive MRSA colonization surveillance program. It is not intended to diagnose MRSA infection nor to guide or monitor treatment for MRSA infections. Test performance is not FDA approved in patients less than 60 years old. Performed at [facility], [address], [place], [place] [address]   Culture, blood (Routine X 2) w Reflex to ID Panel     Status: None   Collection Time: 12/13/24 11:15 AM   Specimen: BLOOD  Result Value Ref Range Status   Specimen Description BLOOD SITE NOT SPECIFIED  Final   Special Requests   Final    BOTTLES DRAWN AEROBIC AND ANAEROBIC Blood Culture results may not be optimal due to an inadequate volume of blood received in culture bottles   Culture   Final    NO GROWTH 5 DAYS Performed at [facility], [address] [place], [place] [address]    Report Status 12/18/2024 FINAL  Final  Culture, blood (Routine X 2) w Reflex to ID Panel     Status: None   Collection Time: 12/13/24 11:15 AM   Specimen: BLOOD  Result Value Ref Range Status   Specimen Description BLOOD SITE NOT SPECIFIED  Final   Special Requests   Final    BOTTLES DRAWN AEROBIC AND ANAEROBIC Blood Culture adequate volume   Culture   Final    NO GROWTH 5 DAYS Performed at [facility], [address], [place], [place] [address]    Report Status 12/18/2024 FINAL  Final      Radiology Studies: DG CHEST PORT 1 VIEW Result Date: 12/21/2024 EXAM: 1 VIEW(S) XRAY OF THE CHEST 12/21/2024 09:43:39 AM COMPARISON: 12/14/2024  CLINICAL HISTORY: Dyspnea FINDINGS: LINES, TUBES AND DEVICES: Enteric tube in place with tip below diaphragm and outside field of view. Right internal jugular central venous catheter in place with tip in mid superior vena cava. Implanted loop recorder noted  over left chest. LUNGS AND PLEURA: Mild pulmonary edema. Diffuse left lung patchy opacities with mild improvement in aeration of right lung base. Persistent diffuse interstitial opacities. Small left pleural effusion. No pneumothorax. HEART AND MEDIASTINUM: TAVR noted. Cardiomegaly, unchanged. CABG markers noted. BONES AND SOFT TISSUES: Sternotomy wires noted. Partially imaged cervical fusion hardware. IMPRESSION: 1. Mild pulmonary edema and small left pleural effusion. 2. Diffuse left lung patchy opacities with persistent diffuse interstitial opacities, with mild improvement in aeration of the right lung base. 3. Stable support apparatus Electronically signed by: Norleen Boxer MD 12/21/2024 10:07 AM EST RP Workstation: [id number]       LOS: 11 days   Joette Pebbles  [facility] Pager on [email or web address]  12/22/2024, 10:14 AM   "

## 2024-12-23 LAB — GLUCOSE, CAPILLARY
Glucose-Capillary: 106 mg/dL — ABNORMAL HIGH (ref 70–99)
Glucose-Capillary: 107 mg/dL — ABNORMAL HIGH (ref 70–99)
Glucose-Capillary: 113 mg/dL — ABNORMAL HIGH (ref 70–99)
Glucose-Capillary: 132 mg/dL — ABNORMAL HIGH (ref 70–99)
Glucose-Capillary: 158 mg/dL — ABNORMAL HIGH (ref 70–99)
Glucose-Capillary: 186 mg/dL — ABNORMAL HIGH (ref 70–99)

## 2024-12-23 LAB — HEPATIC FUNCTION PANEL
ALT: 35 U/L (ref 0–44)
AST: 40 U/L (ref 15–41)
Albumin: 3.1 g/dL — ABNORMAL LOW (ref 3.5–5.0)
Alkaline Phosphatase: 127 U/L — ABNORMAL HIGH (ref 38–126)
Bilirubin, Direct: 0.2 mg/dL (ref 0.0–0.2)
Indirect Bilirubin: 0.2 mg/dL — ABNORMAL LOW (ref 0.3–0.9)
Total Bilirubin: 0.3 mg/dL (ref 0.0–1.2)
Total Protein: 6.7 g/dL (ref 6.5–8.1)

## 2024-12-23 LAB — CBC
HCT: 26 % — ABNORMAL LOW (ref 39.0–52.0)
Hemoglobin: 8.5 g/dL — ABNORMAL LOW (ref 13.0–17.0)
MCH: 29.8 pg (ref 26.0–34.0)
MCHC: 32.7 g/dL (ref 30.0–36.0)
MCV: 91.2 fL (ref 80.0–100.0)
Platelets: 327 K/uL (ref 150–400)
RBC: 2.85 MIL/uL — ABNORMAL LOW (ref 4.22–5.81)
RDW: 14.6 % (ref 11.5–15.5)
WBC: 11.6 K/uL — ABNORMAL HIGH (ref 4.0–10.5)
nRBC: 0 % (ref 0.0–0.2)

## 2024-12-23 LAB — RENAL FUNCTION PANEL
Albumin: 3.1 g/dL — ABNORMAL LOW (ref 3.5–5.0)
Anion gap: 9 (ref 5–15)
BUN: 110 mg/dL — ABNORMAL HIGH (ref 8–23)
CO2: 28 mmol/L (ref 22–32)
Calcium: 9 mg/dL (ref 8.9–10.3)
Chloride: 99 mmol/L (ref 98–111)
Creatinine, Ser: 2.72 mg/dL — ABNORMAL HIGH (ref 0.61–1.24)
GFR, Estimated: 22 mL/min — ABNORMAL LOW
Glucose, Bld: 109 mg/dL — ABNORMAL HIGH (ref 70–99)
Phosphorus: 3.7 mg/dL (ref 2.5–4.6)
Potassium: 4.9 mmol/L (ref 3.5–5.1)
Sodium: 136 mmol/L (ref 135–145)

## 2024-12-23 LAB — MAGNESIUM: Magnesium: 2.4 mg/dL (ref 1.7–2.4)

## 2024-12-23 MED ORDER — OXYCODONE HCL 5 MG PO TABS
ORAL_TABLET | ORAL | Status: AC
  Filled 2024-12-23: qty 1

## 2024-12-23 MED ORDER — ANTICOAGULANT SODIUM CITRATE 4% (200MG/5ML) IV SOLN: 5.0000 mL | Status: DC | PRN

## 2024-12-23 MED ORDER — HEPARIN SODIUM (PORCINE) 1000 UNIT/ML IJ SOLN
INTRAMUSCULAR | Status: AC
  Filled 2024-12-23: qty 4

## 2024-12-23 MED ORDER — NAPHAZOLINE-GLYCERIN 0.012-0.25 % OP SOLN: 1.0000 [drp] | Freq: Four times a day (QID) | OPHTHALMIC | Status: DC | PRN

## 2024-12-23 MED ORDER — ALTEPLASE 2 MG IJ SOLR: 2.0000 mg | Freq: Once | INTRAMUSCULAR | Status: DC | PRN

## 2024-12-23 MED ORDER — HEPARIN SODIUM (PORCINE) 1000 UNIT/ML DIALYSIS: 1000.0000 [IU] | INTRAMUSCULAR | Status: DC | PRN

## 2024-12-23 NOTE — Progress Notes (Signed)
 Inpatient Rehab Admissions Coordinator:   We received insurance approval for CIR.  Will follow for admit pending bed availability.   Reche Lowers, PT, DPT Admissions Coordinator [phone] 12/23/24 4:11 PM

## 2024-12-23 NOTE — Progress Notes (Signed)
 Patient ID: Shane [name], male   DOB: Jan 30, 1941, 84 y.o.   MRN: [id number] [facility] Progress Note   Assessment/ Plan:   1. Acute kidney Injury on chronic kidney disease stage III: Secondary to ATN with dependency on CRRT between 1/10 - 1/12 and then transitioned onto intermittent hemodialysis on MWF schedule.  Urine output improving however without evidence of clear renal recovery with worsening azotemia.  On schedule for hemodialysis today. 2.  Septic shock: Secondary to gangrene of left foot status post left BKA on 12/11/2024.  Transitioned to midodrine  after previously requiring pressors.  Completed Zyvox  and Zosyn . 3.  Paroxysmal atrial fibrillation: Rate controlled with ongoing anticoagulation with Eliquis . 4.  Anemia: Secondary to chronic illness and recent exacerbation by postop losses.  Will continue to monitor to determine need for adjustment of ESA therapy. 5.  Status post left below-knee amputation: With evidence of peripheral vascular disease and gangrene of left foot.  Subjective:   Reports to be feeling better and states that he is getting stronger/able to sit in recliner.   Objective:   BP 108/60 (BP Location: Left Arm)   Pulse 68   Temp 97.7 F (36.5 C) (Axillary)   Resp 17   Ht 6' (1.829 m)   Wt 101.3 kg   SpO2 98%   BMI 30.29 kg/m   Intake/Output Summary (Last 24 hours) at 12/23/2024 0910 Last data filed at 12/23/2024 0500 Gross per 24 hour  Intake 4770 ml  Output 1400 ml  Net 3370 ml   Weight change: 1 kg  Physical Exam: Gen: Comfortably resting in bed, wife at bedside CVS: Pulse regular rhythm, normal rate, S1 and S2 normal Resp: Clear to auscultation bilaterally, no rales/rhonchi.  Right IJ temporary dialysis catheter Abd: Soft, obese, nontender, bowel sounds normal Ext: Status post left BKA with wound VAC in situ.  Status post prior right BKA.  Imaging: DG CHEST PORT 1 VIEW Result Date: 12/21/2024 EXAM: 1 VIEW(S) XRAY OF THE CHEST  12/21/2024 09:43:39 AM COMPARISON: 12/14/2024 CLINICAL HISTORY: Dyspnea FINDINGS: LINES, TUBES AND DEVICES: Enteric tube in place with tip below diaphragm and outside field of view. Right internal jugular central venous catheter in place with tip in mid superior vena cava. Implanted loop recorder noted over left chest. LUNGS AND PLEURA: Mild pulmonary edema. Diffuse left lung patchy opacities with mild improvement in aeration of right lung base. Persistent diffuse interstitial opacities. Small left pleural effusion. No pneumothorax. HEART AND MEDIASTINUM: TAVR noted. Cardiomegaly, unchanged. CABG markers noted. BONES AND SOFT TISSUES: Sternotomy wires noted. Partially imaged cervical fusion hardware. IMPRESSION: 1. Mild pulmonary edema and small left pleural effusion. 2. Diffuse left lung patchy opacities with persistent diffuse interstitial opacities, with mild improvement in aeration of the right lung base. 3. Stable support apparatus Electronically signed by: Norleen Boxer MD 12/21/2024 10:07 AM EST RP Workstation: [id number]    Labs: BMET Recent Labs  Lab 12/17/24 0432 12/18/24 0515 12/19/24 0457 12/20/24 0430 12/21/24 0511 12/22/24 0500 12/23/24 0420  NA 136 135 136 135 135 137 136  K 4.2 4.1 3.9 4.2 4.2 4.7 4.9  CL 100 98 97* 97* 96* 98 99  CO2 27 24 26 26 29 29 28   GLUCOSE 205* 204* 173* 136* 210* 155* 109*  BUN 57* 98* 93* 118* 71* 102* 110*  CREATININE 2.17* 3.65* 3.34* 3.77* 2.28* 2.74* 2.72*  CALCIUM  8.9 9.0 8.9 9.0 9.0 8.9 9.0  PHOS 2.9 3.1 3.9 4.2 2.9 3.9 3.7   CBC Recent Labs  Lab 12/17/24 0432 12/18/24 0515 12/19/24 0457 12/23/24 0420  WBC 9.4 9.6 10.7* 11.6*  HGB 8.6* 8.5* 8.4* 8.5*  HCT 25.9* 24.9* 25.1* 26.0*  MCV 90.9 89.2 90.9 91.2  PLT 183 193 224 327    Medications:     acetaminophen  (TYLENOL ) oral liquid 160 mg/5 mL  500 mg Per Tube TID   acidophilus  1 capsule Oral Daily   apixaban   2.5 mg Per Tube BID   vitamin C   1,000 mg Per Tube Daily    atorvastatin   80 mg Per Tube Daily   Chlorhexidine  Gluconate Cloth  6 each Topical Q0600   feeding supplement (PROSource TF20)  60 mL Per Tube BID   ferrous sulfate   300 mg Per Tube Q breakfast   gabapentin   100 mg Per Tube QHS   insulin  aspart  0-15 Units Subcutaneous Q4H   insulin  aspart  10 Units Subcutaneous Q4H   insulin  glargine  22 Units Subcutaneous BID   midodrine   5 mg Per Tube Q8H   multivitamin with minerals  1 tablet Per Tube q AM   nutrition supplement (JUVEN)  1 packet Per Tube BID BM   mouth rinse  15 mL Mouth Rinse 4 times per day   pantoprazole  (PROTONIX ) IV  40 mg Intravenous BID   ramelteon   8 mg Oral QHS   sodium chloride  flush  3 mL Intravenous Q12H   tamsulosin   0.4 mg Oral QPC supper    Gordy Blanch, MD 12/23/2024, 9:10 AM

## 2024-12-23 NOTE — Progress Notes (Signed)
" °   12/23/24 1620  Vitals  Temp 98.4 F (36.9 C)  Temp Source Oral  BP 105/65  MAP (mmHg) 74  BP Location Left Arm  BP Method Automatic  Patient Position (if appropriate) Lying  Pulse Rate 81  Pulse Rate Source Monitor  ECG Heart Rate 81  Resp 16  Oxygen Therapy  SpO2 100 %  O2 Device Nasal Cannula  O2 Flow Rate (L/min) 2 L/min  Patient Activity (if Appropriate) In bed  Pulse Oximetry Type Continuous  Oximetry Probe Site Changed No  During Treatment Monitoring  Blood Flow Rate (mL/min) 249 mL/min  Arterial Pressure (mmHg) -108.48 mmHg  Venous Pressure (mmHg) 100.6 mmHg  TMP (mmHg) 6.46 mmHg  Ultrafiltration Rate (mL/min) 0 mL/min  Dialysate Flow Rate (mL/min) 299 ml/min  Dialysate Potassium Concentration 2  Dialysate Calcium  Concentration 2.5  Duration of HD Treatment -hour(s) 3.39 hour(s)  Cumulative Fluid Removed (mL) per Treatment  1508.67  HD Safety Checks Performed Yes  Intra-Hemodialysis Comments Tx completed;Tolerated well  Post Treatment  Dialyzer Clearance Lightly streaked  Liters Processed 53  Fluid Removed (mL) 1500 mL  Tolerated HD Treatment Yes  Hemodialysis Catheter Right Internal jugular  Placement Date/Time: 12/14/24 1300   Placed prior to admission: No  Time Out: Correct patient;Correct site;Correct procedure  Maximum sterile barrier precautions: Hand hygiene;Cap;Mask;Sterile gown;Sterile gloves;Large sterile sheet;Sterile probe cove...  Site Condition No complications  Blue Lumen Status Heparin  locked;Antimicrobial dead end cap  Red Lumen Status Heparin  locked;Antimicrobial dead end cap  Purple Lumen Status N/A  Catheter fill solution Heparin  1000 units/ml  Catheter fill volume (Arterial) 1.2 cc  Catheter fill volume (Venous) 1.2  Dressing Type Transparent  Dressing Status Antimicrobial disc/dressing in place;Clean, Dry, Intact  Drainage Description None  Dressing Change Due 12/26/24  Post treatment catheter status Capped and Clamped    "

## 2024-12-23 NOTE — Progress Notes (Signed)
 Physical Therapy Treatment Patient Details Name: Shane [name] MRN: [id number] DOB: [birth year] Today's Date: 12/23/2024   History of Present Illness Shane [name] [name] is an 84 year old male with ischemic ulcers who presents with progressive painful ulceration of the left foot and now s/p L BKA on 1/7.Pt with hypotension 1/8 and transferred to ICU. CRRT started 1/10. PHMx: idiopathic pulmonary fibrosis, OSA, HTN, DM2, CKD, Afib, CHF, CABG, R BKA 2022.    PT Comments  Pt received in bed and reports feeling much better and more himself today. Pt remained alert throughout entire treatment, even when fatigued. Pt performed bed mobility with mod A +2. Tolerated seated there ex and balance exercises as well as pre transfer training including donning RLE prosthesis. BP dropped from 112/70 to 94/84 with initial sitting but returned to 109/54 at 3 mins and pt asymptomatic today. Patient will benefit from intensive inpatient follow-up therapy, >3 hours/day. PT will continue to follow.     If plan is discharge home, recommend the following: Two people to help with walking and/or transfers;Two people to help with bathing/dressing/bathroom;Assistance with cooking/housework;Assist for transportation;Help with stairs or ramp for entrance   Can travel by private vehicle        Equipment Recommendations  Hospital bed;Hoyer lift    Recommendations for Other Services Rehab consult     Precautions / Restrictions Precautions Precautions: Fall Recall of Precautions/Restrictions: Intact Precaution/Restrictions Comments: watch BP Restrictions Weight Bearing Restrictions Per Provider Order: Yes LLE Weight Bearing Per Provider Order: Non weight bearing     Mobility  Bed Mobility Overal bed mobility: Needs Assistance Bed Mobility: Supine to Sit, Sit to Supine     Supine to sit: Mod assist, +2 for physical assistance Sit to supine: Mod assist, +2 for physical assistance   General bed mobility  comments: pt able to reach across self with LUE to assist with rail. Mod A for hips to EOB and mod A for elevation of trunk into sitting    Transfers                   General transfer comment: pretransfer training tolerated today. Pt assisted in donning RLE prosthesis while sitting EOB and worked on scooting along EOB to right.    Ambulation/Gait               General Gait Details: unable   Stairs             Wheelchair Mobility     Tilt Bed    Modified Rankin (Stroke Patients Only)       Balance Overall balance assessment: Needs assistance Sitting-balance support: No upper extremity supported Sitting balance-Leahy Scale: Fair Sitting balance - Comments: worked on sitting balance EOB with and without RLE prosthesis. Min A needed to reach fwd to don prosthesis                                    Communication Communication Communication: No apparent difficulties  Cognition Arousal: Alert Behavior During Therapy: WFL for tasks assessed/performed   PT - Cognitive impairments: Awareness, Initiation, Sequencing                       PT - Cognition Comments: pt remained alert throughout session even as he fatigued, was able to verbalize that he was fatiguing Following commands: Intact      Cueing Cueing Techniques: Verbal  cues, Tactile cues  Exercises General Exercises - Upper Extremity Shoulder Flexion: AROM, Both, 10 reps, Seated Amputee Exercises Quad Sets: AROM, Both, 10 reps, Supine Gluteal Sets: AROM, Both, 10 reps, Supine Knee Flexion: AROM, Both, 10 reps, Seated Knee Extension: AROM, Both, 10 reps, Seated Straight Leg Raises: AROM, Both, 10 reps, Supine Other Exercises Other Exercises: pull up on bed rails x5    General Comments General comments (skin integrity, edema, etc.): spouse and daughter present in session. BP dropped from 112/70 to 94/84 with initial sitting but returned to 109/54 at 3 mins, pt  asymptomatic      Pertinent Vitals/Pain Pain Assessment Pain Assessment: Faces Faces Pain Scale: No hurt    Home Living                          Prior Function            PT Goals (current goals can now be found in the care plan section) Acute Rehab PT Goals Patient Stated Goal: improve mobility Progress towards PT goals: Progressing toward goals    Frequency    Min 3X/week      PT Plan      Co-evaluation              AM-PAC PT 6 Clicks Mobility   Outcome Measure  Help needed turning from your back to your side while in a flat bed without using bedrails?: A Lot Help needed moving from lying on your back to sitting on the side of a flat bed without using bedrails?: A Lot Help needed moving to and from a bed to a chair (including a wheelchair)?: Total Help needed standing up from a chair using your arms (e.g., wheelchair or bedside chair)?: Total Help needed to walk in hospital room?: Total Help needed climbing 3-5 steps with a railing? : Total 6 Click Score: 8    End of Session Equipment Utilized During Treatment: Oxygen Activity Tolerance: Patient tolerated treatment well Patient left: in bed;with call bell/phone within reach;with bed alarm set;with family/visitor present Nurse Communication: Mobility status;Need for lift equipment PT Visit Diagnosis: Other abnormalities of gait and mobility (R26.89)     Time: 9070-8986 PT Time Calculation (min) (ACUTE ONLY): 44 min  Charges:    $Therapeutic Exercise: 8-22 mins $Therapeutic Activity: 23-37 mins PT General Charges $$ ACUTE PT VISIT: 1 Visit                     Shane [name], PT  Acute Rehab Services Secure chat preferred Office [phone]    Shane CROME Damione Robideau 12/23/2024, 12:22 PM

## 2024-12-23 NOTE — Progress Notes (Signed)
 "  [facility] PROGRESS NOTE   Shane [name] FMW:[id number] DOB: 05/02/1941 DOA: 12/11/2024  PCP: Shepard Ade, MD  Brief History: 84 year old male with past medical history significant for chronic kidney disease stage III, diabetes, hypertension, hyperlipidemia, IPF, OSA on CPAP and at bedtime 2L , and HFrEF with LVEF 30 to 35%. He presented to [facility] 1/7 after failing conservative outpatient therapy for ischemic lower extremity ulcers and left foot with gangrene. He underwent transtibial amputation and was subsequently sent to the medical floor for recovery. In the early a.m. hours 1/8 he became hypotensive which did improve temporarily with crystalloid bolus, however, he again became hypotensive with systolic blood pressures in the 70s and 80s.  He was subsequently transferred to the ICU.  He was stabilized.  He was also noted to be in acute kidney injury.  Nephrology was consulted.  He underwent CRRT.  Stabilized and then transferred to the floor.  Consultants: Critical care medicine.  Nephrology.  Orthopedics.  Procedures: Left transtibial amputation 1/7   Subjective/Interval History: Patient was noted to be more awake and alert this morning.  According to wife he got some sleep but was also awake quite a bit during the night.  Pain is better controlled.    Assessment/Plan:  Left foot gangrene/septic shock Secondary to left foot gangrene.  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 Patient was in the ICU.  Required pressors.  Patient was stabilized. Patient was started on midodrine .  Blood pressures have improved with occasional high readings.  Midodrine  dose was decreased to 5 mg every 8 hours.  Blood pressure holding steady.  Maintain current dose for now. He has completed course of Zosyn .  He was on linezolid  previously as well.   Acute kidney injury on chronic kidney disease stage IIIb Patient had worsening in his renal function.  Nephrology was  consulted.  Patient was started on CRRT.  Now on intermittent hemodialysis.  Urine output has picked up in the last few days. Further management per nephrology. Foley catheter was discontinued and he has been able to void via external catheter. BUN remains elevated.  Plan is for hemodialysis today.  Acute metabolic encephalopathy/hospital delirium Remains encephalopathic though appears to show some improvement.  Continue to engage with patient on a frequent basis.  No focal neurological deficits noted.   Continue with Rozerem .  Haldol  as needed.   Mentation is gradually improving.  Episodes of agitation has decreased in frequency.  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  Continue SSI and glargine. HbA1c 7.6.  Chronic systolic CHF EF is 30 to 35% based on echocardiogram done on 12/12/2024. Cardiac status stable for the most part.  Volume being managed with hemodialysis.  Paroxysmal atrial fibrillation Eliquis  being continued.   Currently not on any rate limiting drugs due to his recent septic shock.  He is requiring midodrine  for blood pressure support.  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  Underwent left transtibial amputation during this hospitalization.  Orthopedics is following periodically.  Has a wound VAC but he does not need it at discharge. Tylenol  on a scheduled basis for pain control.  Avoid oxycodone  as much as possible.  Normocytic anemia Likely anemia of chronic disease.  No evidence of overt bleeding.  Hemoglobin remains stable.  No need to check on a daily basis.  Oropharyngeal dysphagia/moderate protein calorie malnutrition Has a cortrak feeding tube.  Speech therapy is  following.  Hopefully as his mentation improves we will be able to start him on oral diet.  Underwent modified barium swallow 1/15 and unfortunately diet cannot be advanced quite yet.   Patient and wife  mentions that at baseline he has always had some degree of difficulty swallowing which he attributes to his cervical spine issues. SLP to reassess.  Diarrhea Has had profuse stool output presumably from his tube feedings.  He was given Imodium .  Flexi-Seal had to be inserted.  Still with significant output.  Continue Flexi-Seal for now.  Imodium  as needed.  History of sleep apnea Resume CPAP once feeding tube has been discontinued.  DVT Prophylaxis: On apixaban  Code Status: Full code Family Communication: Discussed with his wife Disposition Plan: CIR is being considered    Medications: Scheduled:  acetaminophen  (TYLENOL ) oral liquid 160 mg/5 mL  500 mg Per Tube TID   acidophilus  1 capsule Oral Daily   apixaban   2.5 mg Per Tube BID   vitamin C   1,000 mg Per Tube Daily   atorvastatin   80 mg Per Tube Daily   Chlorhexidine  Gluconate Cloth  6 each Topical Q0600   feeding supplement (PROSource TF20)  60 mL Per Tube BID   ferrous sulfate   300 mg Per Tube Q breakfast   gabapentin   100 mg Per Tube QHS   insulin  aspart  0-15 Units Subcutaneous Q4H   insulin  aspart  10 Units Subcutaneous Q4H   insulin  glargine  22 Units Subcutaneous BID   midodrine   5 mg Per Tube Q8H   multivitamin with minerals  1 tablet Per Tube q AM   nutrition supplement (JUVEN)  1 packet Per Tube BID BM   mouth rinse  15 mL Mouth Rinse 4 times per day   pantoprazole  (PROTONIX ) IV  40 mg Intravenous BID   ramelteon   8 mg Oral QHS   sodium chloride  flush  3 mL Intravenous Q12H   tamsulosin   0.4 mg Oral QPC supper   Continuous:  feeding supplement (KATE FARMS STANDARD 1.4) Liquid 50 mL/hr at 12/22/24 2121   PRN:acetaminophen , albuterol , dextromethorphan , EPINEPHrine , haloperidol  lactate, hydrOXYzine , [COMPLETED] loperamide  HCl **FOLLOWED BY** loperamide  HCl, methocarbamol , mupirocin  ointment, olopatadine , ondansetron , mouth rinse, oxyCODONE , phenol, sodium chloride  flush, traZODone   Objective:  Vital  Signs  Vitals:   12/22/24 2353 12/23/24 0102 12/23/24 0417 12/23/24 0806  BP: (!) 105/48 (!) 122/50 (!) 122/51 108/60  Pulse:  68    Resp:  17    Temp: (!) 97.5 F (36.4 C)  97.7 F (36.5 C)   TempSrc: Axillary  Axillary Oral  SpO2:  98%    Weight:   101.3 kg   Height:        Intake/Output Summary (Last 24 hours) at 12/23/2024 0923 Last data filed at 12/23/2024 0500 Gross per 24 hour  Intake 4770 ml  Output 1400 ml  Net 3370 ml   Filed Weights   12/21/24 0419 12/22/24 0435 12/23/24 0417  Weight: 106.5 kg 100.3 kg 101.3 kg   General appearance: Awake alert.  In no distress Resp: Clear to auscultation bilaterally.  Normal effort Cardio: S1-S2 is normal regular.  No S3-S4.  No rubs murmurs or bruit GI: Abdomen is soft.  Nontender nondistended.  Bowel sounds are present normal.  No masses organomegaly Extremities: No edema.  Able to move his extremities. Distracted but oriented to place person month.  Got the year after a few attempts.  No obvious focal neurological deficits.  Lab Results:  Data Reviewed: I  have personally reviewed following labs and reports of the imaging studies  CBC: Recent Labs  Lab 12/17/24 0432 12/18/24 0515 12/19/24 0457 12/23/24 0420  WBC 9.4 9.6 10.7* 11.6*  HGB 8.6* 8.5* 8.4* 8.5*  HCT 25.9* 24.9* 25.1* 26.0*  MCV 90.9 89.2 90.9 91.2  PLT 183 193 224 327    Basic Metabolic Panel: Recent Labs  Lab 12/19/24 0457 12/20/24 0430 12/21/24 0511 12/22/24 0500 12/23/24 0420  NA 136 135 135 137 136  K 3.9 4.2 4.2 4.7 4.9  CL 97* 97* 96* 98 99  CO2 26 26 29 29 28   GLUCOSE 173* 136* 210* 155* 109*  BUN 93* 118* 71* 102* 110*  CREATININE 3.34* 3.77* 2.28* 2.74* 2.72*  CALCIUM  8.9 9.0 9.0 8.9 9.0  MG 2.6* 2.6* 2.2 2.4 2.4  PHOS 3.9 4.2 2.9 3.9 3.7    GFR: Estimated Creatinine Clearance: 25.4 mL/min (A) (by C-G formula based on SCr of 2.72 mg/dL (H)).  Liver Function Tests: Recent Labs  Lab 12/19/24 0457 12/20/24 0430 12/21/24 0511  12/22/24 0500 12/23/24 0420  AST  --   --   --   --  40  ALT  --   --   --   --  35  ALKPHOS  --   --   --   --  127*  BILITOT  --   --   --   --  0.3  PROT  --   --   --   --  6.7  ALBUMIN  3.1* 3.1* 3.3* 3.2* 3.1*  3.1*    CBG: Recent Labs  Lab 12/22/24 1632 12/22/24 2010 12/23/24 0005 12/23/24 0421 12/23/24 0804  GLUCAP 138* 166* 158* 113* 107*     Recent Results (from the past 240 hours)  Culture, blood (Routine X 2) w Reflex to ID Panel     Status: None   Collection Time: 12/13/24 11:15 AM   Specimen: BLOOD  Result Value Ref Range Status   Specimen Description BLOOD SITE NOT SPECIFIED  Final   Special Requests   Final    BOTTLES DRAWN AEROBIC AND ANAEROBIC Blood Culture results may not be optimal due to an inadequate volume of blood received in culture bottles   Culture   Final    NO GROWTH 5 DAYS Performed at [facility], [address], [place], [place] [address]    Report Status 12/18/2024 FINAL  Final  Culture, blood (Routine X 2) w Reflex to ID Panel     Status: None   Collection Time: 12/13/24 11:15 AM   Specimen: BLOOD  Result Value Ref Range Status   Specimen Description BLOOD SITE NOT SPECIFIED  Final   Special Requests   Final    BOTTLES DRAWN AEROBIC AND ANAEROBIC Blood Culture adequate volume   Culture   Final    NO GROWTH 5 DAYS Performed at [facility], [address], [place], [place] [address]    Report Status 12/18/2024 FINAL  Final      Radiology Studies: DG CHEST PORT 1 VIEW Result Date: 12/21/2024 EXAM: 1 VIEW(S) XRAY OF THE CHEST 12/21/2024 09:43:39 AM COMPARISON: 12/14/2024 CLINICAL HISTORY: Dyspnea FINDINGS: LINES, TUBES AND DEVICES: Enteric tube in place with tip below diaphragm and outside field of view. Right internal jugular central venous catheter in place with tip in mid superior vena cava. Implanted loop recorder noted over left chest. LUNGS AND PLEURA: Mild pulmonary edema. Diffuse left lung patchy opacities with  mild improvement in  aeration of right lung base. Persistent diffuse interstitial opacities. Small left pleural effusion. No pneumothorax. HEART AND MEDIASTINUM: TAVR noted. Cardiomegaly, unchanged. CABG markers noted. BONES AND SOFT TISSUES: Sternotomy wires noted. Partially imaged cervical fusion hardware. IMPRESSION: 1. Mild pulmonary edema and small left pleural effusion. 2. Diffuse left lung patchy opacities with persistent diffuse interstitial opacities, with mild improvement in aeration of the right lung base. 3. Stable support apparatus Electronically signed by: Norleen Boxer MD 12/21/2024 10:07 AM EST RP Workstation: [id number]       LOS: 12 days   Joette Pebbles  [facility] Pager on [email or web address]  12/23/2024, 9:23 AM   "

## 2024-12-24 ENCOUNTER — Inpatient Hospital Stay (HOSPITAL_COMMUNITY)

## 2024-12-24 DIAGNOSIS — R110 Nausea: Secondary | ICD-10-CM

## 2024-12-24 DIAGNOSIS — R1032 Left lower quadrant pain: Secondary | ICD-10-CM

## 2024-12-24 LAB — RENAL FUNCTION PANEL
Albumin: 3.3 g/dL — ABNORMAL LOW (ref 3.5–5.0)
Anion gap: 11 (ref 5–15)
BUN: 77 mg/dL — ABNORMAL HIGH (ref 8–23)
CO2: 30 mmol/L (ref 22–32)
Calcium: 9 mg/dL (ref 8.9–10.3)
Chloride: 95 mmol/L — ABNORMAL LOW (ref 98–111)
Creatinine, Ser: 2.31 mg/dL — ABNORMAL HIGH (ref 0.61–1.24)
GFR, Estimated: 27 mL/min — ABNORMAL LOW
Glucose, Bld: 123 mg/dL — ABNORMAL HIGH (ref 70–99)
Phosphorus: 4.3 mg/dL (ref 2.5–4.6)
Potassium: 4.7 mmol/L (ref 3.5–5.1)
Sodium: 136 mmol/L (ref 135–145)

## 2024-12-24 LAB — CBC WITH DIFFERENTIAL/PLATELET
Abs Immature Granulocytes: 0.08 K/uL — ABNORMAL HIGH (ref 0.00–0.07)
Basophils Absolute: 0.1 K/uL (ref 0.0–0.1)
Basophils Relative: 0 %
Eosinophils Absolute: 0.6 K/uL — ABNORMAL HIGH (ref 0.0–0.5)
Eosinophils Relative: 4 %
HCT: 28.3 % — ABNORMAL LOW (ref 39.0–52.0)
Hemoglobin: 9.2 g/dL — ABNORMAL LOW (ref 13.0–17.0)
Immature Granulocytes: 1 %
Lymphocytes Relative: 10 %
Lymphs Abs: 1.4 K/uL (ref 0.7–4.0)
MCH: 29.4 pg (ref 26.0–34.0)
MCHC: 32.5 g/dL (ref 30.0–36.0)
MCV: 90.4 fL (ref 80.0–100.0)
Monocytes Absolute: 1.5 K/uL — ABNORMAL HIGH (ref 0.1–1.0)
Monocytes Relative: 10 %
Neutro Abs: 10.8 K/uL — ABNORMAL HIGH (ref 1.7–7.7)
Neutrophils Relative %: 75 %
Platelets: 359 K/uL (ref 150–400)
RBC: 3.13 MIL/uL — ABNORMAL LOW (ref 4.22–5.81)
RDW: 14.7 % (ref 11.5–15.5)
WBC: 14.3 K/uL — ABNORMAL HIGH (ref 4.0–10.5)
nRBC: 0 % (ref 0.0–0.2)

## 2024-12-24 LAB — URINALYSIS, ROUTINE W REFLEX MICROSCOPIC
Bilirubin Urine: NEGATIVE
Glucose, UA: NEGATIVE mg/dL
Hgb urine dipstick: NEGATIVE
Ketones, ur: NEGATIVE mg/dL
Leukocytes,Ua: NEGATIVE
Nitrite: NEGATIVE
Protein, ur: 100 mg/dL — AB
Specific Gravity, Urine: 1.016 (ref 1.005–1.030)
pH: 5 (ref 5.0–8.0)

## 2024-12-24 LAB — HEPATIC FUNCTION PANEL
ALT: 34 U/L (ref 0–44)
AST: 48 U/L — ABNORMAL HIGH (ref 15–41)
Albumin: 3.2 g/dL — ABNORMAL LOW (ref 3.5–5.0)
Alkaline Phosphatase: 136 U/L — ABNORMAL HIGH (ref 38–126)
Bilirubin, Direct: 0.1 mg/dL (ref 0.0–0.2)
Indirect Bilirubin: 0.2 mg/dL — ABNORMAL LOW (ref 0.3–0.9)
Total Bilirubin: 0.3 mg/dL (ref 0.0–1.2)
Total Protein: 7.1 g/dL (ref 6.5–8.1)

## 2024-12-24 LAB — GLUCOSE, CAPILLARY
Glucose-Capillary: 125 mg/dL — ABNORMAL HIGH (ref 70–99)
Glucose-Capillary: 127 mg/dL — ABNORMAL HIGH (ref 70–99)
Glucose-Capillary: 134 mg/dL — ABNORMAL HIGH (ref 70–99)
Glucose-Capillary: 161 mg/dL — ABNORMAL HIGH (ref 70–99)
Glucose-Capillary: 172 mg/dL — ABNORMAL HIGH (ref 70–99)
Glucose-Capillary: 63 mg/dL — ABNORMAL LOW (ref 70–99)
Glucose-Capillary: 77 mg/dL (ref 70–99)
Glucose-Capillary: 91 mg/dL (ref 70–99)

## 2024-12-24 LAB — MAGNESIUM: Magnesium: 2.2 mg/dL (ref 1.7–2.4)

## 2024-12-24 MED ORDER — INSULIN GLARGINE 100 UNIT/ML ~~LOC~~ SOLN
18.0000 [IU] | Freq: Two times a day (BID) | SUBCUTANEOUS | Status: DC
  Administered 2024-12-25: 18 [IU] via SUBCUTANEOUS
  Filled 2024-12-24 (×2): qty 0.18

## 2024-12-24 MED ORDER — INSULIN ASPART 100 UNIT/ML IJ SOLN
4.0000 [IU] | INTRAMUSCULAR | Status: DC
  Administered 2024-12-25 (×4): 4 [IU] via SUBCUTANEOUS
  Filled 2024-12-24 (×4): qty 4

## 2024-12-24 MED ORDER — DEXTROSE 50 % IV SOLN
1.0000 | Freq: Once | INTRAVENOUS | Status: AC
  Administered 2024-12-24: 50 mL via INTRAVENOUS
  Filled 2024-12-24: qty 50

## 2024-12-24 NOTE — Progress Notes (Signed)
 Inpatient Rehab Admissions Coordinator:  I have no beds available for this patient to admit to CIR today.  Will continue to follow for timing of potential admission pending bed availability.  Hopeful for tomorrow.   Reche Lowers, PT, DPT Admissions Coordinator [phone] 12/24/24 10:31 AM

## 2024-12-24 NOTE — Progress Notes (Addendum)
 Patient ID: Shane [name], male   DOB: [birth year], 84 y.o.   MRN: [id number] [facility] Progress Note   Assessment/ Plan:   1. Acute kidney Injury on chronic kidney disease stage III: Secondary to ATN with dependency on CRRT between 1/10 - 1/12 and then transitioned onto intermittent hemodialysis on MWF schedule.  No urine output charted from overnight however wife feels that he has been urinating but just not collecting reliably.  He underwent hemodialysis yesterday without problems although has some confusion about events during dialysis.  I will order for his next hemodialysis treatment tomorrow and request conversion to tunneled hemodialysis catheter in the next 24-48 hours if leukocytosis continues to improve. 2.  Septic shock: Secondary to gangrene of left foot status post left BKA on 12/11/2024.  Transitioned to midodrine  after previously requiring pressors.  Completed Zyvox  and Zosyn . 3.  Paroxysmal atrial fibrillation: Rate controlled with ongoing anticoagulation with Eliquis . 4.  Anemia: Secondary to chronic illness and recent exacerbation by postop losses.  Will continue to monitor to determine need for adjustment of ESA therapy. 5.  Status post left below-knee amputation: With evidence of peripheral vascular disease and gangrene of left foot.  Subjective:   Confused about some events at dialysis yesterday   Objective:   BP (!) 110/45 (BP Location: Left Arm)   Pulse 78   Temp 99 F (37.2 C) (Oral)   Resp 17   Ht 6' (1.829 m)   Wt 99.8 kg   SpO2 98%   BMI 29.84 kg/m   Intake/Output Summary (Last 24 hours) at 12/24/2024 0950 Last data filed at 12/24/2024 0516 Gross per 24 hour  Intake 1595.83 ml  Output 2650 ml  Net -1054.17 ml   Weight change: 4.2 kg  Physical Exam: Gen: Comfortably resting in bed, wife and grandsons at bedside CVS: Pulse regular rhythm, normal rate, S1 and S2 normal Resp: Clear to auscultation bilaterally, no rales/rhonchi.  Right IJ  temporary dialysis catheter Abd: Soft, obese, nontender, bowel sounds normal Ext: Status post left BKA with wound VAC in situ.  Status post prior right BKA.  Imaging: DG Abd Portable 1V Result Date: 12/24/2024 CLINICAL DATA:  Nausea. EXAM: PORTABLE ABDOMEN - 1 VIEW COMPARISON:  12/13/2024 and 07/24/2024 FINDINGS: Enteric tube is present with tip right of midline in the mid abdomen likely over the distal duodenal C sweep. Bowel gas pattern is nonobstructive. No free peritoneal air. Significant degenerative changes of the spine and hips. IMPRESSION: 1. Nonobstructive bowel gas pattern. 2. Enteric tube with tip over the distal duodenal C sweep. Electronically Signed   By: Toribio Agreste M.D.   On: 12/24/2024 09:35    Labs: BMET Recent Labs  Lab 12/18/24 0515 12/19/24 0457 12/20/24 0430 12/21/24 0511 12/22/24 0500 12/23/24 0420 12/24/24 0448  NA 135 136 135 135 137 136 136  K 4.1 3.9 4.2 4.2 4.7 4.9 4.7  CL 98 97* 97* 96* 98 99 95*  CO2 24 26 26 29 29 28 30   GLUCOSE 204* 173* 136* 210* 155* 109* 123*  BUN 98* 93* 118* 71* 102* 110* 77*  CREATININE 3.65* 3.34* 3.77* 2.28* 2.74* 2.72* 2.31*  CALCIUM  9.0 8.9 9.0 9.0 8.9 9.0 9.0  PHOS 3.1 3.9 4.2 2.9 3.9 3.7 4.3   CBC Recent Labs  Lab 12/18/24 0515 12/19/24 0457 12/23/24 0420  WBC 9.6 10.7* 11.6*  HGB 8.5* 8.4* 8.5*  HCT 24.9* 25.1* 26.0*  MCV 89.2 90.9 91.2  PLT 193 224 327    Medications:  acetaminophen  (TYLENOL ) oral liquid 160 mg/5 mL  500 mg Per Tube TID   acidophilus  1 capsule Oral Daily   apixaban   2.5 mg Per Tube BID   vitamin C   1,000 mg Per Tube Daily   atorvastatin   80 mg Per Tube Daily   Chlorhexidine  Gluconate Cloth  6 each Topical Q0600   feeding supplement (PROSource TF20)  60 mL Per Tube BID   ferrous sulfate   300 mg Per Tube Q breakfast   gabapentin   100 mg Per Tube QHS   insulin  aspart  0-15 Units Subcutaneous Q4H   insulin  aspart  10 Units Subcutaneous Q4H   insulin  glargine  22 Units Subcutaneous  BID   midodrine   5 mg Per Tube Q8H   multivitamin with minerals  1 tablet Per Tube q AM   nutrition supplement (JUVEN)  1 packet Per Tube BID BM   mouth rinse  15 mL Mouth Rinse 4 times per day   pantoprazole  (PROTONIX ) IV  40 mg Intravenous BID   ramelteon   8 mg Oral QHS   sodium chloride  flush  3 mL Intravenous Q12H   tamsulosin   0.4 mg Oral QPC supper    Gordy Blanch, MD 12/24/2024, 9:50 AM

## 2024-12-24 NOTE — Progress Notes (Signed)
 Dr.Krishnan notified of pt's blood sugar 63.

## 2024-12-24 NOTE — H&P (Signed)
 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 Laneta Arzella Holland of [facility], CKD stage III, diabetes mellitus, GERD, hyperlipidemia, hypertension, idiopathic pulmonary fibrosis/OSA on CPAP followed by Dr. 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  Per chart review patient lives with spouse and grandson.  Two-level home bed and bath main level with ramped entrance.  At baseline, he is modified independent with the use of a scooter in the home and community, and transfers independently with intermittent use of right lower extremity prosthesis to assist.  Presented 12/11/2024 with gangrenous ischemic changes to the left foot with reported rest pain.  Limb was not felt to be salvageable undergoing left BKA 12/11/2024 per Dr. Harden with application of wound VAC.  Postoperative 1/8 he became hypotensive and subsequently transferred to the ICU, required pressor support which had been since weaned off to midodrine  5 mg every 8 hours as well as placed on a course of linezolid /Zosyn  for suspect sepsis and completing course.  Echocardiogram with ejection fraction of 30 to 35% grade 3 diastolic dysfunction.  Hospitalization was complicated by AKI on CKD stage III requiring CRRT 1/10 - 1/12 with follow-up nephrology services transitioned into intermittent hemodialysis .  Initially with Foley catheter tube urine output has picked up and voiding via external catheter.  Renal ultrasound 12/15/2024  showed no hydronephrosis and latest creatinine 2.73.  Patient with intermittent bouts of agitation and restlessness suspect encephalopathy/ICU delirium that has steadily improved as patient attends therapies.  Acute on chronic anemia latest hemoglobin 8.9.  Patient was cleared to resume chronic Eliquis  for history of atrial fibrillation.  Speech therapy consulted for oropharyngeal dysphagia/moderate protein calorie malnutrition a Cortrak tube was placed currently remains n.p.o. patient did have profuse stool output presumably from tube feeds and a Flexi-Seal was inserted.  Therapy evaluations completed due to patient's decreased functional mobility was admitted for a comprehensive rehab program. Patient is currently receiving HD.   Review of Systems  Constitutional:  Positive for malaise/fatigue. Negative for chills and fever.  HENT:  Negative for hearing loss.   Eyes:  Positive for blurred vision and double vision.  Respiratory:  Negative for cough and wheezing.        Shortness of breath with heavy exertion and  Cardiovascular:  Positive for palpitations and leg swelling. Negative for chest pain.  Gastrointestinal:  Positive for diarrhea and nausea. Negative for vomiting.       GERD  Genitourinary:  Positive for frequency and urgency. Negative for dysuria, flank pain and hematuria.  Musculoskeletal:  Positive for joint pain and myalgias.  Skin:  Negative for rash.  Neurological:  Positive for dizziness and weakness.  Psychiatric/Behavioral:  Positive for depression and memory loss. The patient has insomnia.        Anxiety  All other systems reviewed and are negative.  Past Medical History:  Diagnosis Date   Acute blood loss anemia    Anxiety    AV block, Mobitz 1    Cataract    Chronic kidney disease    d/t DM   CKD (chronic kidney disease), stage  III (HCC)    COVID-19 virus infection 07/18/2020   Last Assessment & Plan:   Formatting of this note might be different from the original.   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 10/2019 Echo: EF 55-60%, Gr2 DD. Nl RV.    Neuromuscular disorder (HCC)    Neuropathy associated with endocrine disorder    Nonobstructive CAD (coronary artery disease)    a. 2012 Cath: mod, nonobs dzs; b. 10/2016 MV: EF 60%, no ischemia.   OSA on CPAP 05/05/2017   Unattended Home Sleep Test 7/2/813-AHI 38.6/hour, desaturation to 64%, body weight 261 pounds   PONV (postoperative nausea and vomiting)    Postoperative anemia due to acute blood loss 11/07/2020   Postoperative hemorrhagic shock 03/20/2021   Sleep apnea     uses cpap asked to bring mask and tubing   Past Surgical History:  Procedure Laterality Date   ABDOMINAL AORTOGRAM W/LOWER EXTREMITY N/A 12/10/2020   Procedure: ABDOMINAL AORTOGRAM W/LOWER EXTREMITY;  Surgeon: Magda Debby SAILOR, MD;  Location: [facility] INVASIVE CV LAB;  Service: Cardiovascular;  Laterality: N/A;   AMPUTATION Right 01/22/2021   Procedure: 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;  Surgeon: Harden Jerona GAILS, MD;  Location: [facility] OR;  Service: Orthopedics;  Laterality: Right;   AMPUTATION Right 03/17/2021   Procedure: RIGHT BELOW KNEE AMPUTATION;  Surgeon: Harden Jerona GAILS, MD;  Location: [facility] OR;  Service: Orthopedics;  Laterality: Right;   AMPUTATION Left 12/11/2024   Procedure: AMPUTATION BELOW KNEE;  Surgeon: Harden Jerona GAILS, MD;  Location: [facility] OR;  Service: Orthopedics;  Laterality: Left;   ANKLE FUSION Right 01/22/2021   Procedure: RIGHT FOOT TIBIOCALCANEAL FUSION;  Surgeon: Harden Jerona GAILS, MD;   Location: [facility] OR;  Service: Orthopedics;  Laterality: Right;   ANTERIOR FUSION CERVICAL SPINE  2012   APPLICATION OF WOUND VAC Left 12/11/2024   Procedure: APPLICATION, WOUND VAC;  Surgeon: Harden Jerona GAILS, MD;  Location: [facility] OR;  Service: Orthopedics;  Laterality: Left;   CARDIAC CATHETERIZATION  2011   CARDIAC CATHETERIZATION N/A 11/09/2016   Procedure: Right Heart Cath;  Surgeon: Victory LELON Sharps, MD;  Location: [facility] INVASIVE CV LAB;  Service: Cardiovascular;  Laterality: N/A;   carpel tunnel     left wrist   CATARACT EXTRACTION     CATARACT EXTRACTION W/ INTRAOCULAR LENS  IMPLANT, BILATERAL  2013   CERVICAL LAMINECTOMY  2012   COLONOSCOPY N/A 01/14/2013   Procedure: COLONOSCOPY;  Surgeon: Norleen SAILOR Kiang, MD;  Location: [facility] ENDOSCOPY;  Service: Endoscopy;  Laterality: N/A;   CORONARY ARTERY BYPASS GRAFT  11/04/2020   LIMA-LAD, SVG-OM1, SVG-PDA (Dr Norleen [facility]) dc 11/18/2020   EYE SURGERY     I & D EXTREMITY Right 02/19/2021   Procedure: RIGHT ANKLE DEBRIDEMENT AND PLACEMENT ANTIBIOTIC BEADS;  Surgeon: Harden Jerona GAILS, MD;  Location: [facility] OR;  Service: Orthopedics;  Laterality: Right;   KNEE SURGERY  1998   left   LEFT HEART CATH AND CORONARY  ANGIOGRAPHY N/A 07/10/2020   Procedure: LEFT HEART CATH AND CORONARY ANGIOGRAPHY;  Surgeon: Wonda Sharper, MD;  Location: [facility] INVASIVE CV LAB;  Service: Cardiovascular;  Laterality: N/A;   LUMBAR LAMINECTOMY  2003   LUNG BIOPSY Left 12/26/2016   Procedure: LUNG BIOPSY;  Surgeon: Elspeth JAYSON Millers, MD;  Location: [facility] OR;  Service: Thoracic;  Laterality: Left;   PACEMAKER IMPLANT N/A 03/30/2020   Procedure: PACEMAKER IMPLANT;  Surgeon: Waddell Danelle ORN, MD;  Location: [facility];  Service: Cardiovascular;  Laterality: N/A;   PERIPHERAL VASCULAR INTERVENTION Right 12/10/2020   Procedure: PERIPHERAL VASCULAR INTERVENTION;  Surgeon: Magda Debby SAILOR, MD;  Location: [facility];  Service: Cardiovascular;  Laterality: Right;  SFA   POSTERIOR FUSION CERVICAL  SPINE  2012   PPM GENERATOR REMOVAL N/A 12/14/2020   Procedure: PPM GENERATOR REMOVAL;  Surgeon: Waddell Danelle ORN, MD;  Location: [facility] LAB;  Service: Cardiovascular;  Laterality: N/A;   TEE WITHOUT CARDIOVERSION N/A 12/11/2020   Procedure: TRANSESOPHAGEAL ECHOCARDIOGRAM (TEE);  Surgeon: Barbaraann Darryle Debby, MD;  Location: [facility] ENDOSCOPY;  Service: Cardiovascular;  Laterality: N/A;   TRIGGER FINGER RELEASE  2011   4th finger left hand   VIDEO ASSISTED THORACOSCOPY Left 12/26/2016   Procedure: VIDEO ASSISTED THORACOSCOPY;  Surgeon: Elspeth JAYSON Millers, MD;  Location: [facility] OR;  Service: Thoracic;  Laterality: Left;   VIDEO BRONCHOSCOPY N/A 12/26/2016   Procedure: VIDEO BRONCHOSCOPY;  Surgeon: Elspeth JAYSON Millers, MD;  Location: [facility] OR;  Service: Thoracic;  Laterality: N/A;   Family History  Problem Relation Age of Onset   Diabetes Mellitus II Mother    Emphysema Father 43   Heart attack Father    Colon cancer Neg Hx    Esophageal cancer Neg Hx    Rectal cancer Neg Hx    Stomach cancer Neg Hx    Social History:  reports that he has never smoked. He has been exposed to tobacco smoke. He has never used smokeless tobacco. He reports that he does not drink alcohol and does not use drugs. Allergies: Allergies[1] Medications Prior to Admission  Medication Sig Dispense Refill   acetaminophen  (TYLENOL ) 500 MG tablet Take 1,000 mg by mouth every 8 (eight) hours as needed for mild pain (pain score 1-3) or moderate pain (pain score 4-6).     albuterol  (VENTOLIN  HFA) 108 (90 Base) MCG/ACT inhaler Inhale 2 puffs into the lungs every 4 (four) hours as needed for wheezing or shortness of breath. 6.7 g 0   apixaban  (ELIQUIS ) 5 MG TABS tablet Take 5 mg by mouth 2 (two) times daily.     Ascorbic Acid  (VITAMIN C ) 1000 MG tablet Take 1,000 mg by mouth daily.     atorvastatin  (LIPITOR ) 80 MG tablet TAKE 1 TABLET BY MOUTH EVERY DAY 90 tablet 3   diphenhydrAMINE  (BENADRYL ) 50 MG tablet Take 50 mg by mouth at  bedtime as needed for itching.     docusate sodium  (COLACE) 100 MG capsule Take 1 capsule (100 mg total) by mouth 2 (two) times daily. (Patient taking differently: Take 100 mg by mouth at bedtime.) 10 capsule 0   escitalopram  (LEXAPRO ) 10 MG tablet Take 1 tablet (10 mg total) by mouth daily. 30 tablet 0   furosemide  (LASIX ) 20 MG tablet Take 20 mg by mouth daily with lunch.     furosemide  (LASIX ) 40 MG tablet Take 1 tablet (40 mg total) by mouth 2 (two) times daily.     gabapentin  (NEURONTIN ) 100 MG capsule  Take 1 capsule (100 mg total) by mouth 3 (three) times daily. (Patient taking differently: Take 100 mg by mouth 3 (three) times daily as needed (Pain).) 90 capsule 0   HUMALOG  KWIKPEN 100 UNIT/ML KwikPen Inject 10-15 Units into the skin 3 (three) times daily. Can take 15-18 Units with meals while on Prednisone  for 3days (Patient taking differently: Inject 15-20 Units into the skin 3 (three) times daily with meals. Sliding scale)     HYDROcodone -acetaminophen  (NORCO/VICODIN) 5-325 MG tablet Take 1 tablet by mouth at bedtime as needed for moderate pain (pain score 4-6) or severe pain (pain score 7-10).     hydrOXYzine  (ATARAX ) 25 MG tablet Take 25 mg by mouth at bedtime.     ibuprofen (ADVIL) 200 MG tablet Take 400 mg by mouth every 6 (six) hours as needed for moderate pain (pain score 4-6) or mild pain (pain score 1-3).     lisinopril  (ZESTRIL ) 10 MG tablet Take 10 mg by mouth daily.     LORazepam  (ATIVAN ) 1 MG tablet Take 1 tablet (1 mg total) by mouth at bedtime as needed for sleep. 3 tablet 0   Magnesium  200 MG TABS Take 200 mg by mouth at bedtime.     methocarbamol  (ROBAXIN ) 500 MG tablet Take 1 tablet (500 mg total) by mouth every 8 (eight) hours as needed for muscle spasms. 30 tablet 0   Multiple Vitamin (MULTIVITAMIN WITH MINERALS) TABS tablet Take 1 tablet by mouth in the morning.     Multiple Vitamins-Minerals (ICAPS AREDS FORMULA PO) Take 2 tablets by mouth daily.     Nutritional  Supplements (FEEDING SUPPLEMENT, NEPRO CARB STEADY,) LIQD Take 237 mLs by mouth 2 (two) times daily between meals. (Patient taking differently: Take 237 mLs by mouth 2 (two) times daily between meals. Premier)     Olopatadine  HCl (PATADAY  OP) Place 2 drops into both eyes daily as needed (allergies).     OXYGEN Inhale 2 L into the lungs See admin instructions. Inhale 3L of Oxygen everynight at bedside. May uses throughout the day as needed.     pantoprazole  (PROTONIX ) 40 MG tablet Take 1 tablet (40 mg total) by mouth 2 (two) times daily. 60 tablet 0   polyethylene glycol powder (GLYCOLAX /MIRALAX ) 17 GM/SCOOP powder Take 17 g by mouth daily as needed for mild constipation or moderate constipation.     Probiotic Product (ALIGN) 10 MG CAPS Take 10 mg by mouth daily.     traZODone  (DESYREL ) 50 MG tablet Take 75 mg by mouth at bedtime.     TRESIBA  FLEXTOUCH 200 UNIT/ML FlexTouch Pen Inject 20 Units into the skin in the morning.     zinc  sulfate 220 (50 Zn) MG capsule Take 1 capsule (220 mg total) by mouth daily. 30 capsule 0   albuterol  (PROVENTIL ) (2.5 MG/3ML) 0.083% nebulizer solution Take 2.5 mg by nebulization 2 (two) times daily as needed for wheezing or shortness of breath.     EPINEPHrine  0.3 mg/0.3 mL IJ SOAJ injection Inject 0.3 mg into the muscle as needed for anaphylaxis. 1 each 1   mupirocin  ointment (BACTROBAN ) 2 % Apply 1 Application topically daily as needed (Rash).     triamcinolone cream (KENALOG) 0.1 % Apply 1 Application topically daily as needed (Rash).        Home: Home Living Family/patient expects to be discharged to:: Private residence Living Arrangements: Spouse/significant other, Other relatives (grandson) Available Help at Discharge: Family, Available 24 hours/day Type of Home: House Home Access: Ramped entrance Home Layout:  Two level, Able to live on main level with bedroom/bathroom Bathroom Shower/Tub: Health Visitor: Handicapped height Bathroom  Accessibility: Yes Home Equipment: Information systems manager, Agricultural Consultant (2 wheels), Art gallery manager, Wheelchair - manual, [employer] - single point, Other (comment), Grab bars - tub/shower, Grab bars - toilet, Adaptive equipment Adaptive Equipment: Reacher, Sock aid, Long-handled shoe horn, Long-handled sponge   Functional History: Prior Function Prior Level of Function : Independent/Modified Independent Mobility Comments: Uses a scooter in house and when out and about ADLs Comments: pt able to transfer to shower seat  Functional Status:  Mobility: Bed Mobility Overal bed mobility: Needs Assistance Bed Mobility: Supine to Sit, Sit to Supine, Rolling Rolling: Min assist Supine to sit: Mod assist, HOB elevated, Used rails Sit to supine: Mod assist, HOB elevated, Used rails General bed mobility comments: assistance with trunk and use of bed features Transfers General transfer comment: pretransfer training tolerated today. Pt assisted in donning RLE prosthesis while sitting EOB and worked on scooting along EOB to right. Ambulation/Gait General Gait Details: unable    ADL: ADL Overall ADL's : Needs assistance/impaired Grooming: Wash/dry hands, Wash/dry face, Oral care, Contact guard assist, Sitting Grooming Details (indicate cue type and reason): EOB General ADL Comments: Working on bed mobility,  sitting EOB balance today as precursor to basic ADLs, and transfers and following commands  Cognition: Cognition Orientation Level: Oriented X4 Cognition Arousal: Alert Behavior During Therapy: WFL for tasks assessed/performed  Physical Exam: Blood pressure (!) 134/56, pulse 60, temperature 98.4 F (36.9 C), temperature source Oral, resp. rate 15, height 6' (1.829 m), weight 99.8 kg, SpO2 100%. Gen: no distress, normal appearing HEENT: oral mucosa pink and moist, NCAT Cardio: Reg rate Chest: normal effort, normal rate of breathing Abd: soft, non-distended Ext: no edema Psych: pleasant, normal  affect Skin:    Comments: Left BKA is dressed with wound VAC.  Right BKA well-healed.  Neurological:     Comments: Patient is alert and makes eye contact with examiner.  Appeared to be a bit distracted.  He was able to provide name.  He was able to provide year after multiple attempts.   Results for orders placed or performed during the hospital encounter of 12/11/24 (from the past 48 hours)  Glucose, capillary     Status: Abnormal   Collection Time: 12/23/24  8:04 AM  Result Value Ref Range   Glucose-Capillary 107 (H) 70 - 99 mg/dL    Comment: Glucose reference range applies only to samples taken after fasting for at least 8 hours.  Glucose, capillary     Status: Abnormal   Collection Time: 12/23/24 11:42 AM  Result Value Ref Range   Glucose-Capillary 186 (H) 70 - 99 mg/dL    Comment: Glucose reference range applies only to samples taken after fasting for at least 8 hours.  Glucose, capillary     Status: Abnormal   Collection Time: 12/23/24  5:13 PM  Result Value Ref Range   Glucose-Capillary 106 (H) 70 - 99 mg/dL    Comment: Glucose reference range applies only to samples taken after fasting for at least 8 hours.  Glucose, capillary     Status: Abnormal   Collection Time: 12/23/24  8:20 PM  Result Value Ref Range   Glucose-Capillary 132 (H) 70 - 99 mg/dL    Comment: Glucose reference range applies only to samples taken after fasting for at least 8 hours.  Glucose, capillary     Status: None   Collection Time: 12/24/24 12:23 AM  Result Value Ref Range   Glucose-Capillary 77 70 - 99 mg/dL    Comment: Glucose reference range applies only to samples taken after fasting for at least 8 hours.  Glucose, capillary     Status: None   Collection Time: 12/24/24  1:27 AM  Result Value Ref Range   Glucose-Capillary 91 70 - 99 mg/dL    Comment: Glucose reference range applies only to samples taken after fasting for at least 8 hours.  Glucose, capillary     Status: Abnormal   Collection  Time: 12/24/24  4:40 AM  Result Value Ref Range   Glucose-Capillary 125 (H) 70 - 99 mg/dL    Comment: Glucose reference range applies only to samples taken after fasting for at least 8 hours.  Renal function panel (daily at 0500)     Status: Abnormal   Collection Time: 12/24/24  4:48 AM  Result Value Ref Range   Sodium 136 135 - 145 mmol/L   Potassium 4.7 3.5 - 5.1 mmol/L   Chloride 95 (L) 98 - 111 mmol/L   CO2 30 22 - 32 mmol/L   Glucose, Bld 123 (H) 70 - 99 mg/dL    Comment: Glucose reference range applies only to samples taken after fasting for at least 8 hours.   BUN 77 (H) 8 - 23 mg/dL   Creatinine, Ser 7.68 (H) 0.61 - 1.24 mg/dL   Calcium  9.0 8.9 - 10.3 mg/dL   Phosphorus 4.3 2.5 - 4.6 mg/dL   Albumin  3.3 (L) 3.5 - 5.0 g/dL   GFR, Estimated 27 (L) >60 mL/min    Comment: (NOTE) Calculated using the CKD-EPI Creatinine Equation (2021)    Anion gap 11 5 - 15    Comment: Performed at [facility], [address] [place], [place] [address]  Magnesium      Status: None   Collection Time: 12/24/24  4:48 AM  Result Value Ref Range   Magnesium  2.2 1.7 - 2.4 mg/dL    Comment: Performed at [facility], [address], [place], [place] [address]  Glucose, capillary     Status: Abnormal   Collection Time: 12/24/24  8:01 AM  Result Value Ref Range   Glucose-Capillary 127 (H) 70 - 99 mg/dL    Comment: Glucose reference range applies only to samples taken after fasting for at least 8 hours.  Glucose, capillary     Status: Abnormal   Collection Time: 12/24/24 11:56 AM  Result Value Ref Range   Glucose-Capillary 63 (L) 70 - 99 mg/dL    Comment: Glucose reference range applies only to samples taken after fasting for at least 8 hours.  CBC with Differential/Platelet     Status: Abnormal   Collection Time: 12/24/24 11:57 AM  Result Value Ref Range   WBC 14.3 (H) 4.0 - 10.5 K/uL   RBC 3.13 (L) 4.22 - 5.81 MIL/uL   Hemoglobin 9.2 (L) 13.0 - 17.0 g/dL   HCT 71.6 (L) 60.9 - 47.9  %   MCV 90.4 80.0 - 100.0 fL   MCH 29.4 26.0 - 34.0 pg   MCHC 32.5 30.0 - 36.0 g/dL   RDW 85.2 88.4 - 84.4 %   Platelets 359 150 - 400 K/uL   nRBC 0.0 0.0 - 0.2 %   Neutrophils Relative % 75 %   Neutro Abs 10.8 (H) 1.7 - 7.7 K/uL   Lymphocytes Relative 10 %   Lymphs Abs 1.4 0.7 - 4.0 K/uL   Monocytes Relative 10 %  Monocytes Absolute 1.5 (H) 0.1 - 1.0 K/uL   Eosinophils Relative 4 %   Eosinophils Absolute 0.6 (H) 0.0 - 0.5 K/uL   Basophils Relative 0 %   Basophils Absolute 0.1 0.0 - 0.1 K/uL   Immature Granulocytes 1 %   Abs Immature Granulocytes 0.08 (H) 0.00 - 0.07 K/uL    Comment: Performed at [facility], [address], [place], [place] [address]  Hepatic function panel     Status: Abnormal   Collection Time: 12/24/24 11:57 AM  Result Value Ref Range   Total Protein 7.1 6.5 - 8.1 g/dL   Albumin  3.2 (L) 3.5 - 5.0 g/dL   AST 48 (H) 15 - 41 U/L   ALT 34 0 - 44 U/L   Alkaline Phosphatase 136 (H) 38 - 126 U/L   Total Bilirubin 0.3 0.0 - 1.2 mg/dL   Bilirubin, Direct 0.1 0.0 - 0.2 mg/dL   Indirect Bilirubin 0.2 (L) 0.3 - 0.9 mg/dL    Comment: Performed at [facility], [address], [place], [place] [address]  Glucose, capillary     Status: Abnormal   Collection Time: 12/24/24 12:51 PM  Result Value Ref Range   Glucose-Capillary 172 (H) 70 - 99 mg/dL    Comment: Glucose reference range applies only to samples taken after fasting for at least 8 hours.  Glucose, capillary     Status: Abnormal   Collection Time: 12/24/24  3:39 PM  Result Value Ref Range   Glucose-Capillary 134 (H) 70 - 99 mg/dL    Comment: Glucose reference range applies only to samples taken after fasting for at least 8 hours.  Urinalysis, Routine w reflex microscopic -Urine, Clean Catch     Status: Abnormal   Collection Time: 12/24/24  6:57 PM  Result Value Ref Range   Color, Urine YELLOW YELLOW   APPearance HAZY (A) CLEAR   Specific Gravity, Urine 1.016 1.005 - 1.030   pH 5.0 5.0 - 8.0    Glucose, UA NEGATIVE NEGATIVE mg/dL   Hgb urine dipstick NEGATIVE NEGATIVE   Bilirubin Urine NEGATIVE NEGATIVE   Ketones, ur NEGATIVE NEGATIVE mg/dL   Protein, ur 899 (A) NEGATIVE mg/dL   Nitrite NEGATIVE NEGATIVE   Leukocytes,Ua NEGATIVE NEGATIVE   RBC / HPF 0-5 0 - 5 RBC/hpf   WBC, UA 6-10 0 - 5 WBC/hpf   Bacteria, UA RARE (A) NONE SEEN   Squamous Epithelial / HPF 0-5 0 - 5 /HPF   Mucus PRESENT     Comment: Performed at [facility], [address] [place], [place] [address]  Glucose, capillary     Status: Abnormal   Collection Time: 12/24/24  8:07 PM  Result Value Ref Range   Glucose-Capillary 161 (H) 70 - 99 mg/dL    Comment: Glucose reference range applies only to samples taken after fasting for at least 8 hours.  Glucose, capillary     Status: Abnormal   Collection Time: 12/25/24 12:16 AM  Result Value Ref Range   Glucose-Capillary 171 (H) 70 - 99 mg/dL    Comment: Glucose reference range applies only to samples taken after fasting for at least 8 hours.   *Note: Due to a large number of results and/or encounters for the requested time period, some results have not been displayed. A complete set of results can be found in Results Review.   DG Abd Portable 1V Result Date: 12/24/2024 CLINICAL DATA:  Nausea. EXAM: PORTABLE ABDOMEN - 1 VIEW COMPARISON:  12/13/2024 and 07/24/2024 FINDINGS: Enteric tube is present with tip right of midline in the mid abdomen likely over the distal duodenal C sweep. Bowel gas pattern is nonobstructive. No free peritoneal air. Significant degenerative changes of the spine and hips. IMPRESSION: 1. Nonobstructive bowel gas pattern. 2. Enteric tube with tip over the distal duodenal C sweep. Electronically Signed   By: Toribio Agreste M.D.   On: 12/24/2024 09:35      Blood pressure (!) 134/56, pulse 60, temperature 98.4 F (36.9 C), temperature source Oral, resp. rate 15, height 6' (1.829 m), weight 99.8 kg, SpO2 100%.  Medical Problem List and  Plan: 1. 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  Antithrombotics: -DVT/anticoagulation:  Pharmaceutical: Eliquis   -antiplatelet therapy: N/A 3. Pain Management: Neurontin  100 mg nightly, oxycodone  as needed, Robaxin  as needed 4. Mood/Behavior/Sleep: Rozerem  8 mg nightly, trazodone  75 mg nightly as needed  -antipsychotic agents: N/A 5. Neuropsych/cognition: This patient is not capable of making decisions on his own behalf. 6. Skin/Wound Care: Routine skin checks 7. Fluids/Electrolytes/Nutrition: Routine and analysis with follow-up chemistries 8.  History of right BKA 2022.  Patient received CIR.  Intermittent use of prosthesis 9.  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  CRRT 1/10 - 1/12 transition to intermittent hemodialysis.  Follow-up renal services 10.  Paroxysmal atrial fibrillation.  Continue Eliquis .  Cardiac rate controlled 11.  Acute on chronic anemia.  Follow-up CBC.  Continue iron  supplement 12.  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  NovoLog  4 units every 4 hours, Lantus  insulin  18 units twice daily. 13.  Hypotension.  ProAmatine  5 mg every 8 hours.  Monitor with increased mobility 14.  Chronic systolic congestive heart failure.  Echocardiogram with ejection fraction of 30 to 35%.  Monitor for any signs of fluid overload 15.  CAD/AV block/Mobitz 1/aortic stenosis/CABG 2021.  Follow-up cardiology services. 16.  Idiopathic pulmonary fibrosis/OSA.  Followed by Dr. Geronimo.  Patient with 2 L oxygen intermittently at home.  Currently not on CPAP until tube feeds discontinued  17.  Oropharyngeal dysphagia.  Follow-up speech therapy.  Currently with Cortrak feeding tube for nutritional support.  Advance diet as tolerated. Currently NPO with  ice chips allowed  18.  Diarrhea.  Related to tube feeds.  Flexi-Seal in place: continue Continue Imodium .  19.  GERD: continue Protonix   20.  Hyperlipidemia: continue Lipitor   I have personally performed a face to face diagnostic evaluation, including, but not limited to relevant history and physical exam findings, of this patient and developed relevant assessment and plan.  Additionally, I have reviewed and concur with the physician assistant's documentation above.  Sven Elks, MD  Toribio PARAS Higinio Grow, PA-C 12/25/2024     [1]  Allergies Allergen Reactions   Codeine Hives and Itching   Ofev  [Nintedanib ] Diarrhea    SEVERE DIARRHEA   Pirfenidone  Diarrhea and Other (See Comments)    Esbriet  (Pirfenidone ) causes elevated LFTs. D/C on 06/14/17 and SEVERE DIARRHEA    Amoxicillin Itching   "

## 2024-12-24 NOTE — Progress Notes (Signed)
 Occupational Therapy Treatment Patient Details Name: Shane [name] MRN: [id number] DOB: Mar 14, 1941 Today's Date: 12/24/2024   History of present illness Mir Shane [name] is an 85 year old male with ischemic ulcers who presents with progressive painful ulceration of the left foot and now s/p L BKA on 1/7.Pt with hypotension 1/8 and transferred to ICU. CRRT started 1/10. PHMx: idiopathic pulmonary fibrosis, OSA, HTN, DM2, CKD, Afib, CHF, CABG, R BKA 2022.   OT comments  Patient demonstrating good gains with OT treatment with mod assist to get to EOB. On EOB patient performed grooming and BUE AROM exercises with supervision to CGA for balance and min assist while performing reaching tasks. Patient returned to supine with mod assist and min assist with rolling to change bed pads.  Patient will benefit from intensive inpatient follow-up therapy, >3 hours/day. Acute OT to continue to follow to address established goals to facilitate DC to next venue of care.        If plan is discharge home, recommend the following:  Two people to help with walking and/or transfers;Two people to help with bathing/dressing/bathroom;Assistance with cooking/housework;Assistance with feeding;Direct supervision/assist for medications management;Direct supervision/assist for financial management;Assist for transportation;Help with stairs or ramp for entrance   Equipment Recommendations  Other (comment) (TBD next venue)    Recommendations for Other Services      Precautions / Restrictions Precautions Precautions: Fall Recall of Precautions/Restrictions: Intact Precaution/Restrictions Comments: watch BP Restrictions Weight Bearing Restrictions Per Provider Order: Yes LLE Weight Bearing Per Provider Order: Non weight bearing       Mobility Bed Mobility Overal bed mobility: Needs Assistance Bed Mobility: Supine to Sit, Sit to Supine, Rolling Rolling: Min assist   Supine to sit: Mod assist, HOB elevated,  Used rails Sit to supine: Mod assist, HOB elevated, Used rails   General bed mobility comments: assistance with trunk and use of bed features    Transfers                         Balance Overall balance assessment: Needs assistance Sitting-balance support: No upper extremity supported, Bilateral upper extremity supported, Single extremity supported Sitting balance-Leahy Scale: Fair Sitting balance - Comments: CGA to supervision seated on EOB, min assist for safety during reaching tasks                                   ADL either performed or assessed with clinical judgement   ADL Overall ADL's : Needs assistance/impaired     Grooming: Wash/dry hands;Wash/dry face;Oral care;Contact guard assist;Sitting Grooming Details (indicate cue type and reason): EOB                                    Extremity/Trunk Assessment              Occupational Psychologist Communication: No apparent difficulties   Cognition Arousal: Alert Behavior During Therapy: WFL for tasks assessed/performed Cognition: Cognition impaired     Awareness: Intellectual awareness impaired, Online awareness impaired   Attention impairment (select first level of impairment): Sustained attention Executive functioning impairment (select all impairments): Problem solving                   Following commands:  Intact        Cueing   Cueing Techniques: Verbal cues, Tactile cues  Exercises Exercises: General Upper Extremity General Exercises - Upper Extremity Shoulder Flexion: AROM, Both, 10 reps, Seated Shoulder Horizontal ABduction: AROM, Both, 10 reps    Shoulder Instructions       General Comments wife present and supportive    Pertinent Vitals/ Pain       Pain Assessment Pain Assessment: 0-10 Pain Score: 4  Pain Location: LLE Pain Descriptors / Indicators: Grimacing, Discomfort Pain  Intervention(s): Limited activity within patient's tolerance, Monitored during session, Repositioned, RN gave pain meds during session  Home Living                                          Prior Functioning/Environment              Frequency  Min 2X/week        Progress Toward Goals  OT Goals(current goals can now be found in the care plan section)  Progress towards OT goals: Progressing toward goals     Plan      Co-evaluation                 AM-PAC OT 6 Clicks Daily Activity     Outcome Measure   Help from another person eating meals?: Total Help from another person taking care of personal grooming?: A Little Help from another person toileting, which includes using toliet, bedpan, or urinal?: A Lot Help from another person bathing (including washing, rinsing, drying)?: A Lot Help from another person to put on and taking off regular upper body clothing?: A Lot Help from another person to put on and taking off regular lower body clothing?: Total 6 Click Score: 11    End of Session Equipment Utilized During Treatment: Oxygen  OT Visit Diagnosis: Other abnormalities of gait and mobility (R26.89);Muscle weakness (generalized) (M62.81);Other symptoms and signs involving cognitive function;Dizziness and giddiness (R42)   Activity Tolerance Patient tolerated treatment well   Patient Left in bed;with call bell/phone within reach;with bed alarm set;with family/visitor present   Nurse Communication Mobility status        Time: 9260-9189 OT Time Calculation (min): 31 min  Charges: OT General Charges $OT Visit: 1 Visit OT Treatments $Self Care/Home Management : 8-22 mins $Therapeutic Exercise: 8-22 mins  Dick Laine, OTA Acute Rehabilitation Services  Office [phone]   Jeb LITTIE Laine 12/24/2024, 8:29 AM

## 2024-12-24 NOTE — PMR Pre-admission (Signed)
 PMR Admission Coordinator Pre-Admission Assessment  Patient: Shane [name] is an 84 y.o., male MRN: [id number] DOB: [birth year] Height: 6' (182.9 cm) Weight: 99.8 kg  Insurance Information HMO: yes    PPO:      PCP:      IPA:      80/20:      OTHER:  PRIMARY: U[employer] Medicare      Policy#: [id number]      Subscriber: pt CM Name: Birdia      Phone#: [phone]     Fax#: [phone] Pre-Cert#: [id number] auth for CIR from [facility] with H&CC for admit 12/23/24 with next review date 12/30/24.  Updates due to fax listed above.        Employer:  Benefits:  Phone #: [phone]     Name:  Eff. Date: 12/05/24     Deduct: $0      Out of Pocket Max: $4200 (met $0)      Life Max: n/a CIR: $395/day for days 1-6      SNF: 20 full days Outpatient:      Co-Pay: $20/visit Home Health: 100%      Co-Pay:  DME: 80%     Co-Pay: 20% Providers: in network  SECONDARY:       Policy#:      Phone#:   Artist:       Phone#:   The Best Boy for patients in Inpatient Rehabilitation Facilities with attached Privacy Act Statement-Health Care Records was provided and verbally reviewed with: Patient and Family  Emergency Contact Information Contact Information     Name Relation Home Work Mobile   Wallander,Patricia Spouse [phone]  [phone]   Alley,Lisa Daughter [phone]  [phone]      Other Contacts   None on File     Current Medical History  Patient Admitting Diagnosis: L BKA  History of Present Illness: Pt is an 84 y/o male with PMH significant for chronic anemia, anxiety/depression, AV block/Mobitz 1, moderate aortic stenosis/PPM 2022, 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 Laneta Arzella Holland of [facility], CKD stage III, DM, GERD, hyperlipidemia, HTN, idiopathic pulmonary fibrosis/OSA on CPAP followed by Dr. 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  He presented to [facility] on 12/11/2024 with gangrenous ischemic changes to the left foot with reported rest pain.  Limb was not felt to be salvageable and pt underwent left BKA 12/11/2024 per Dr. Harden with application of wound VAC.  POD 1 he became hypotensive and subsequently transferred to the ICU.  He did require pressor support, which had been since weaned off to midodrine  5 mg every 8 hours.  He was placed on a course of linezolid /Zosyn  for suspected sepsis and completed abx course.  Echocardiogram with ejection fraction of 30 to 35% grade 3 diastolic dysfunction.  Hospitalization was complicated by AKI.  Nephrology initiated CRRT 1/10 and pt was transitioned to [facility] on 1/13.  Initially with Foley catheter for strict urine output measure and urine output has picked up and foley was removed.  Renal ultrasound 12/15/2024 showed no hydronephrosis and latest creatinine 2.72.  Patient with intermittent bouts of agitation and restlessness suspect encephalopathy/ICU delirium that has steadily improved as patient attends therapies.  Acute on chronic anemia latest hemoglobin 8.5.  Patient was cleared to resume chronic  Eliquis  for history of atrial fibrillation.  Speech therapy consulted for oropharyngeal dysphagia/moderate protein calorie malnutrition a Cortrak tube was placed currently remains n.p.o. Patient did have profuse stool output presumably from tube feeds and a Flexi-Seal was inserted.  Therapy evaluations completed and pt was recommended for a comprehensive rehab program.     Patient's medical record from [facility] has been reviewed by the rehabilitation admission coordinator and physician.  Past Medical History  Past Medical History:  Diagnosis Date   Acute blood loss anemia    Anxiety    AV block, Mobitz 1    Cataract    Chronic kidney disease     d/t DM   CKD (chronic kidney disease), stage III (HCC)    COVID-19 virus infection 07/18/2020   Last Assessment & Plan:   Formatting of this note might be different from the original.  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 10/2019 Echo: EF 55-60%, Gr2 DD. Nl RV.    Neuromuscular disorder (HCC)    Neuropathy associated with endocrine disorder    Nonobstructive CAD (coronary artery disease)    a. 2012 Cath: mod, nonobs dzs; b. 10/2016 MV: EF 60%, no ischemia.   OSA on CPAP 05/05/2017   Unattended Home Sleep Test 7/2/813-AHI 38.6/hour, desaturation to 64%, body weight 261 pounds   PONV (postoperative nausea and vomiting)    Postoperative anemia due to acute blood loss 11/07/2020   Postoperative hemorrhagic shock 03/20/2021   Sleep apnea     uses cpap asked to bring mask and tubing    Has the patient had major surgery during 100 days prior to admission? Yes  Family History   family history includes Diabetes Mellitus II in his mother; Emphysema (age of onset: 58) in his father; Heart attack in his father.  Current Medications Current Medications[1]  Patients Current Diet:  Diet Order             Diet NPO time specified Except for: Ice Chips, Other (See Comments)  Diet effective now                   Precautions / Restrictions Precautions Precautions: Fall Precaution/Restrictions Comments: watch BP Restrictions Weight Bearing Restrictions Per Provider Order: Yes LLE Weight Bearing Per Provider Order: Non weight  bearing   Has the patient had 2 or more falls or a fall with injury in the past year? No  Prior Activity Level Household: previous R BKA, transferred to power scooter with occasional assist (more assist needed recently 2/2 LLE)  Prior Functional Level Self Care: Did the patient need help bathing, dressing, using the toilet or eating? Needed some help  Indoor Mobility: Did the patient need assistance with walking from room to room (with or without device)? Independent  Stairs: Did the patient need assistance with internal or external stairs (with or without device)? Dependent  Functional Cognition: Did the patient need help planning regular tasks such as shopping or  remembering to take medications? Needed some help  Patient Information Are you of Hispanic, Latino/a,or Spanish origin?: A. No, not of Hispanic, Latino/a, or Spanish origin What is your race?: A. White Do you need or want an interpreter to communicate with a doctor or health care staff?: 0. No  Patient's Response To:  Health Literacy and Transportation Is the patient able to respond to health literacy and transportation needs?: Yes Health Literacy - How often do you need to have someone help you when you read instructions, pamphlets, or other written material from your doctor or pharmacy?: Never In the past 12 months, has lack of transportation kept you from medical appointments or from getting medications?: No In the past 12 months, has lack of transportation kept you from meetings, work, or from getting things needed for daily living?: No  Journalist, Newspaper / Equipment Home Equipment: Information systems manager, Agricultural Consultant (2 wheels), Art gallery manager, Wheelchair - manual, Medical Laboratory Scientific Officer - single point, Other (comment), Grab bars - tub/shower, Grab bars - toilet, Adaptive equipment  Prior Device Use: Indicate devices/aids used by the patient prior to current illness, exacerbation or injury? Motorized wheelchair or scooter and  Orthotics/Prosthetics  Current Functional Level Cognition  Orientation Level: Oriented X4    Extremity Assessment (includes Sensation/Coordination)  Upper Extremity Assessment: Generalized weakness RUE Deficits / Details: Both hand edematous (L>R) and heavy when attempts to move them. able to flex and extend digits but not to full range. good initiation with min A to touch face RUE Coordination: decreased fine motor, decreased gross motor LUE Deficits / Details: Both hand edematous (L>R) and heavy when attempts to move them. limited initiation hand ROM; Provided PROM WFL; limited by edema. LUE Coordination: decreased fine motor, decreased gross motor  Lower Extremity Assessment: Defer to PT evaluation RLE Deficits / Details: BKA atleast 3/5 strength LLE Deficits / Details: recnet BKA atleast 3/5 strength    ADLs  Overall ADL's : Needs assistance/impaired Grooming: Wash/dry hands, Wash/dry face, Oral care, Contact guard assist, Sitting Grooming Details (indicate cue type and reason): EOB General ADL Comments: Working on bed mobility,  sitting EOB balance today as precursor to basic ADLs, and transfers and following commands    Mobility  Overal bed mobility: Needs Assistance Bed Mobility: Supine to Sit, Sit to Supine, Rolling Rolling: Min assist Supine to sit: Mod assist, HOB elevated, Used rails Sit to supine: Mod assist, HOB elevated, Used rails General bed mobility comments: assistance with trunk and use of bed features    Transfers  General transfer comment: pretransfer training tolerated today. Pt assisted in donning RLE prosthesis while sitting EOB and worked on scooting along EOB to right.    Ambulation / Gait / Stairs / Wheelchair Mobility  Ambulation/Gait General Gait Details: unable    Posture / Balance Dynamic Sitting Balance Sitting balance - Comments: CGA to supervision seated on EOB, min assist for safety during reaching tasks Balance Overall balance  assessment: Needs assistance Sitting-balance support: No upper extremity supported, Bilateral upper extremity supported, Single extremity supported Sitting balance-Leahy Scale: Fair Sitting balance - Comments: CGA to supervision seated on EOB, min assist for safety during reaching tasks Postural control: Posterior lean    Special considerations/life events  CPAP, Oxygen intermittent 2L, Skin L BKA incision with wound vac, and Diabetic management yes   Previous Home Environment (from acute therapy documentation) Living Arrangements: Spouse/significant other, Other relatives (grandson) Available Help at Discharge: Family, Available 24 hours/day Type of Home: House Home Layout: Two level, Able to  live on main level with bedroom/bathroom Home Access: Ramped entrance Bathroom Shower/Tub: Health Visitor: Handicapped height Bathroom Accessibility: Yes Home Care Services: No  Discharge Living Setting Plans for Discharge Living Setting: Patient's home, Lives with (comment) (spouse, grandson) Type of Home at Discharge: House Discharge Home Layout: Able to live on main level with bedroom/bathroom Discharge Home Access: Ramped entrance Discharge Bathroom Shower/Tub: Walk-in shower Discharge Bathroom Toilet: Handicapped height Discharge Bathroom Accessibility: Yes How Accessible: Accessible via wheelchair (electric scooter) Does the patient have any problems obtaining your medications?: No  Social/Family/Support Systems Patient Roles: Spouse Anticipated Caregiver: spouse, Avelina Anticipated Industrial/product Designer Information: [phone] Ability/Limitations of Caregiver: none stated, has provided care for pt after previous CIR stay Caregiver Availability: 24/7 Discharge Plan Discussed with Primary Caregiver: Yes Is Caregiver In Agreement with Plan?: Yes  Goals Patient/Family Goal for Rehab: PT/OT supervision to min assist, SLP mod I Expected length of stay: 12-16  days Additional Information: Discharge plan: pt will return home with his spouse and grandson providing 24/7 support Pt/Family Agrees to Admission and willing to participate: Yes Program Orientation Provided & Reviewed with Pt/Caregiver Including Roles  & Responsibilities: Yes  Decrease burden of Care through IP rehab admission: n/a  Possible need for SNF placement upon discharge: Not anticipated.  Plan discharge home with spouse and grandson who can provide adequate assist.    Patient Condition: This patient's medical and functional status has changed since the consult dated: 12/20/24 in which the Rehabilitation Physician determined and documented that the patient's condition is appropriate for intensive rehabilitative care in an inpatient rehabilitation facility. See History of Present Illness (above) for medical update. Functional changes are: pt is mod assist +2 for mobility and min assist for ADLs, encephalopathy is clearing. Patient's medical and functional status update has been discussed with the Rehabilitation physician and patient remains appropriate for inpatient rehabilitation. Will admit to inpatient rehab today.  Preadmission Screen Completed By:  Reche FORBES Lowers, PT, DPT 12/24/2024 11:24 AM ______________________________________________________________________   Discussed status with Dr. Lorilee on 12/25/24 at 0930 and received approval for admission today.  Admission Coordinator:  Caitlin E Warren, PT, DPT time 1245/Date 12/25/24   Assessment/Plan: Diagnosis: Left BKA Does the need for close, 24 hr/day Medical supervision in concert with the patient's rehab needs make it unreasonable for this patient to be served in a less intensive setting? Yes Co-Morbidities requiring supervision/potential complications: Overweight, anxiety, depression, idiopathic pulmonary fibrosis, OSA, CKD stage 3, DM, HTN, HLD Due to bladder management, bowel management, safety, skin/wound care, disease  management, medication administration, pain management, and patient education, does the patient require 24 hr/day rehab nursing? Yes Does the patient require coordinated care of a physician, rehab nurse, PT, OT to address physical and functional deficits in the context of the above medical diagnosis(es)? Yes Addressing deficits in the following areas: balance, endurance, locomotion, strength, transferring, bowel/bladder control, bathing, dressing, feeding, grooming, toileting, and psychosocial support, dysphagia, cognition Can the patient actively participate in an intensive therapy program of at least 3 hrs of therapy 5 days a week? Yes The potential for patient to make measurable gains while on inpatient rehab is excellent Anticipated functional outcomes upon discharge from inpatient rehab: supervision PT, supervision OT, supervision SLP Estimated rehab length of stay to reach the above functional goals is: 10-14 days Anticipated discharge destination: Home 10. Overall Rehab/Functional Prognosis: excellent   MD Signature: Sven Lorilee, MD     [1]  Current Facility-Administered Medications:    acetaminophen  (TYLENOL ) 160 MG/5ML solution  500 mg, 500 mg, Per Tube, TID, Krishnan, Gokul, MD, 500 mg at 12/24/24 0845   acetaminophen  (TYLENOL ) tablet 325-650 mg, 325-650 mg, Per Tube, Q6H PRN, Duda, Marcus V, MD, 650 mg at 12/22/24 1025   acidophilus (RISAQUAD) capsule 1 capsule, 1 capsule, Oral, Daily, Gerome, Emma M, PA-C, 1 capsule at 12/24/24 9162   albuterol  (PROVENTIL ) (2.5 MG/3ML) 0.083% nebulizer solution 2.5 mg, 2.5 mg, Nebulization, Q4H PRN, Uhlorn, Garett M, RPH   apixaban  (ELIQUIS ) tablet 2.5 mg, 2.5 mg, Per Tube, BID, Hindman, Katherine G, RPH, 2.5 mg at 12/24/24 9162   ascorbic acid  (VITAMIN C ) tablet 1,000 mg, 1,000 mg, Per Tube, Daily, Bitonti, Michael T, RPH, 1,000 mg at 12/24/24 9166   atorvastatin  (LIPITOR ) tablet 80 mg, 80 mg, Per Tube, Daily, Bitonti, Michael T, RPH, 80 mg  at 12/24/24 9162   Chlorhexidine  Gluconate Cloth 2 % PADS 6 each, 6 each, Topical, Q0600, Rosan Deward ORN, NP, 6 each at 12/24/24 0602   dextromethorphan  (DELSYM ) 30 MG/5ML liquid 15 mg, 15 mg, Per Tube, Daily PRN, Harden Jerona GAILS, MD   EPINEPHrine  (EPI-PEN) injection 0.3 mg, 0.3 mg, Intramuscular, PRN, Gerome, Emma M, PA-C   feeding supplement (KATE FARMS STANDARD ENT 1.4) (tube feed) liquid 1,000 mL, 1,000 mL, Per Tube, Continuous, Sharie Bourbon, MD, Last Rate: 50 mL/hr at 12/24/24 1117, 1,000 mL at 12/24/24 1117   feeding supplement (PROSource TF20) liquid 60 mL, 60 mL, Per Tube, BID, Krishnan, Gokul, MD, 60 mL at 12/24/24 9160   ferrous sulfate  300 (60 Fe) MG/5ML syrup 300 mg, 300 mg, Per Tube, Q breakfast, Harden Jerona GAILS, MD, 300 mg at 12/24/24 9162   gabapentin  (NEURONTIN ) 250 MG/5ML solution 100 mg, 100 mg, Per Tube, QHS, Krishnan, Gokul, MD, 100 mg at 12/23/24 2240   haloperidol  lactate (HALDOL ) injection 2 mg, 2 mg, Intravenous, Q6H PRN, Krishnan, Gokul, MD   hydrOXYzine  (ATARAX ) tablet 25 mg, 25 mg, Per Tube, QHS PRN, Duda, Marcus V, MD, 25 mg at 12/21/24 0053   insulin  aspart (novoLOG ) injection 0-15 Units, 0-15 Units, Subcutaneous, Q4H, Paliwal, Aditya, MD, 2 Units at 12/24/24 0840   insulin  aspart (novoLOG ) injection 10 Units, 10 Units, Subcutaneous, Q4H, Crozier, Peyton, PA-C, 10 Units at 12/24/24 9156   insulin  glargine (LANTUS ) injection 22 Units, 22 Units, Subcutaneous, BID, Crozier, Peyton, PA-C, 22 Units at 12/24/24 0840   [COMPLETED] loperamide  HCl (IMODIUM ) 1 MG/7.5ML suspension 4 mg, 4 mg, Per Tube, TID, 4 mg at 12/19/24 1735 **FOLLOWED BY** loperamide  HCl (IMODIUM ) 1 MG/7.5ML suspension 4 mg, 4 mg, Per Tube, TID PRN, Krishnan, Gokul, MD, 4 mg at 12/21/24 1323   methocarbamol  (ROBAXIN ) tablet 500 mg, 500 mg, Per Tube, Q8H PRN, Antonetta Moccasin B, NP, 500 mg at 12/22/24 1025   midodrine  (PROAMATINE ) tablet 5 mg, 5 mg, Per Tube, Q8H, Krishnan, Gokul, MD, 5 mg at 12/24/24 9148    multivitamin with minerals tablet 1 tablet, 1 tablet, Per Tube, q AM, Cyndy Ozell DASEN, RPH, 1 tablet at 12/24/24 9167   mupirocin  ointment (BACTROBAN ) 2 % 1 Application, 1 Application, Topical, Daily PRN, Gerome, Emma M, PA-C   naphazoline-glycerin  (CLEAR EYES REDNESS) ophth solution 1-2 drop, 1-2 drop, Both Eyes, QID PRN, Krishnan, Gokul, MD   nutrition supplement (JUVEN) (JUVEN) powder packet 1 packet, 1 packet, Per Tube, BID BM, Bitonti, Michael T, RPH, 1 packet at 12/24/24 0841   olopatadine  (PATANOL) 0.1 % ophthalmic solution 1 drop, 1 drop, Both Eyes, Daily PRN, Gerome Herring M, PA-C   ondansetron  (ZOFRAN ) injection  4 mg, 4 mg, Intravenous, Q6H PRN, Gerome Maurilio HERO, PA-C, 4 mg at 12/24/24 0206   Oral care mouth rinse, 15 mL, Mouth Rinse, 4 times per day, Verdene Purchase, MD, 15 mL at 12/24/24 9156   Oral care mouth rinse, 15 mL, Mouth Rinse, PRN, Krishnan, Gokul, MD   oxyCODONE  (Oxy IR/ROXICODONE ) immediate release tablet 2.5 mg, 2.5 mg, Per Tube, Q4H PRN, Krishnan, Gokul, MD, 2.5 mg at 12/24/24 0806   pantoprazole  (PROTONIX ) injection 40 mg, 40 mg, Intravenous, BID, Hindman, Katherine G, RPH, 40 mg at 12/24/24 0830   phenol (CHLORASEPTIC) mouth spray 1 spray, 1 spray, Mouth/Throat, PRN, Gerome, Emma M, PA-C   ramelteon  (ROZEREM ) tablet 8 mg, 8 mg, Oral, QHS, Krishnan, Gokul, MD, 8 mg at 12/23/24 2239   sodium chloride  flush (NS) 0.9 % injection 3 mL, 3 mL, Intravenous, Q12H, Collins, Emma M, PA-C, 3 mL at 12/24/24 9156   sodium chloride  flush (NS) 0.9 % injection 3 mL, 3 mL, Intravenous, PRN, Gerome, Emma M, PA-C   tamsulosin  (FLOMAX ) capsule 0.4 mg, 0.4 mg, Oral, QPC supper, Krishnan, Gokul, MD   traZODone  (DESYREL ) tablet 75 mg, 75 mg, Per Tube, QHS PRN, Duda, Marcus V, MD, 75 mg at 12/21/24 2153

## 2024-12-24 NOTE — Progress Notes (Signed)
 Speech Language Pathology Treatment: Dysphagia  Patient Details Name: Shane [name] MRN: [id number] DOB: 02-28-1941 Today's Date: 12/24/2024 Time: 8463-8440 SLP Time Calculation (min) (ACUTE ONLY): 23 min  Assessment / Plan / Recommendation Clinical Impression  Pt's mentation is improved today which facilitates his ability to engage in therapy. SLP provided education from MBS and pt is better able to explain his h/o symptoms. This includes trouble with solids (rice, meats, peas) that make him feel choked but he does endorse trouble with liquids as well. SLP educated pt on performing effortful swallow and administered trials of ice, water, and purees as pt implemented it. No overt coughing was noted during PO trials, but shortly after finishing, pt did have two strong coughing episodes that he reports as being similar to what happens at home.   PLAN: Hopeful to repeat MBS soon, maybe even by the end of this week, if pt can show consistent level of alertness and appropriate mentation. Encouraged pt to practice effortful swallow in the interim (either with ice chips or dry swallows) with focus on use as an exercise, although suspect this may be a helpful strategy during swallowing as well based on deficits noted on initial MBS.    HPI HPI: 84 yo male admitted 1/7 for L transtibial amputation. Transferred to ICU 1/8 for hypotension. CRRT started 1/10. Intermittent dialysis started 1/14. Seen by SLP 12/2020 and 12/2023 with concern for primary esophageal dysphagia, complaining of difficulty with solids>liquids and episodes of regurgitation. Declined an MBS and although recommended, esophageal assessment was not been completed. PMH: CKD III, DM, HTN, HLD, IPF, OSA on CPAP, HFrEF with LVEF 30-35%      SLP Plan  Continue with current plan of care        Swallow Evaluation Recommendations   Recommendations: NPO;Ice chips PRN after oral care Medication Administration: Via alternative means Oral care  recommendations: Oral care QID (4x/day);Oral care before ice chips/water     Recommendations                           Dysphagia, oropharyngeal phase (R13.12)     Continue with current plan of care     [name] SAILOR., M.A. CCC-SLP Acute Rehabilitation Services Office: [phone]  Secure chat preferred   12/24/2024, 4:53 PM

## 2024-12-24 NOTE — TOC Progression Note (Signed)
 Transition of Care [facility]) - Progression Note    Patient Details  Name: Shane [name] MRN: [id number] Date of Birth: [birth year]  [facility] CM/SW Contact  Graves-Bigelow, Erminio Deems, RN Phone Number: 12/24/2024, 3:10 PM  Clinical Narrative:   Inpatient Rehab Admissions Coordinator continues to follow for potential CIR admission. ICM continues to follow for additional needs as the patient progresses.   Expected Discharge Plan: IP Rehab Facility Barriers to Discharge: Continued Medical Work up  Expected Discharge Plan and Services   Discharge Planning Services: CM Consult Post Acute Care Choice: IP Rehab Living arrangements for the past 2 months: Single Family Home  HH Arranged: NA Social Drivers of Health (SDOH) Interventions SDOH Screenings   Food Insecurity: No Food Insecurity (12/11/2024)  Housing: Low Risk (12/11/2024)  Transportation Needs: No Transportation Needs (12/11/2024)  Utilities: Not At Risk (12/11/2024)  Financial Resource Strain: Low Risk  (08/22/2023)   Received from [facility]  Social Connections: Moderately Integrated (12/18/2024)  Tobacco Use: Low Risk (12/04/2024)   Readmission Risk Interventions    12/15/2023    2:29 PM  Readmission Risk Prevention Plan  Transportation Screening Complete  Medication Review (RN Care Manager) Complete  PCP or Specialist appointment within 3-5 days of discharge Complete  HRI or Home Care Consult Complete  SW Recovery Care/Counseling Consult Complete  Palliative Care Screening Complete  Skilled Nursing Facility Not Applicable

## 2024-12-24 NOTE — Progress Notes (Signed)
 "  [facility] PROGRESS NOTE   Shane [name] FMW:[id number] DOB: March 20, 1941 DOA: 12/11/2024  PCP: Shepard Ade, MD  Brief History: 84 year old male with past medical history significant for chronic kidney disease stage III, diabetes, hypertension, hyperlipidemia, IPF, OSA on CPAP and at bedtime 2L [place], and HFrEF with LVEF 30 to 35%. He presented to [facility] 1/7 after failing conservative outpatient therapy for ischemic lower extremity ulcers and left foot with gangrene. He underwent transtibial amputation and was subsequently sent to the medical floor for recovery. In the early a.m. hours 1/8 he became hypotensive which did improve temporarily with crystalloid bolus, however, he again became hypotensive with systolic blood pressures in the 70s and 80s.  He was subsequently transferred to the ICU.  He was stabilized.  He was also noted to be in acute kidney injury.  Nephrology was consulted.  He underwent CRRT.  Stabilized and then transferred to the floor.  Consultants: Critical care medicine.  Nephrology.  Orthopedics.  Procedures: Left transtibial amputation 1/7   Subjective/Interval History: Patient complains of feeling nauseated this morning.  Complains of left-sided abdominal pain.  Has not had any vomiting though.    Assessment/Plan:  Left foot gangrene/septic shock Secondary to left foot gangrene.  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 Patient was in the ICU.  Required pressors.  Patient was stabilized. Patient was started on midodrine .  Blood pressures have improved with occasional high readings.  Midodrine  dose was decreased to 5 mg every 8 hours.  Blood pressure holding steady.  Maintain current dose for now. He has completed course of Zosyn .  He was on linezolid  previously as well.   Acute kidney injury on chronic kidney disease stage IIIb Patient had worsening in his renal function.  Nephrology was consulted.  Patient was started on CRRT.  Now on  intermittent hemodialysis.  Urine output has picked up in the last few days. Further management per nephrology. Foley catheter was discontinued and he has been able to void via external catheter.  Bladder scans as needed. Patient was hemodialyzed yesterday.  Urine output not charted for yesterday.  Discussed with nursing staff.  Nausea Reason for his nausea is not entirely clear.  He was tender in the lower abdomen.  Bladder scan shows only 250 mL of urine.  Will proceed with abdominal x-ray.  Check CBC.  He is noted to be afebrile.  Could be from his frequent loose stools.  Acute metabolic encephalopathy/hospital delirium Remains encephalopathic though appears to show some improvement.  Continue to engage with patient on a frequent basis.  No focal neurological deficits noted.   Continue with Rozerem .  Haldol  as needed.   Mentation is gradually improving.  Episodes of agitation has decreased in frequency.  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  Continue SSI and glargine. HbA1c 7.6.  Chronic systolic CHF EF is 30 to 35% based on echocardiogram done on 12/12/2024. Cardiac status stable for the most part.  Volume being managed with hemodialysis.  Paroxysmal atrial fibrillation Eliquis  being continued.   Currently not on any rate limiting drugs due to his recent septic shock.  He is requiring midodrine  for blood pressure support.  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  Underwent left transtibial amputation during this hospitalization.  Orthopedics is following periodically.  Has a wound VAC but he does not need it at discharge. Tylenol  on a scheduled basis for pain control.  Avoid oxycodone  as much as possible.  Normocytic anemia Likely anemia of chronic disease.  No evidence of overt bleeding.  Hemoglobin remains stable.    Oropharyngeal dysphagia/moderate protein calorie malnutrition Has a  cortrak feeding tube.  Speech therapy is following.  Hopefully as his mentation improves we will be able to start him on oral diet.  Underwent modified barium swallow 1/15 and unfortunately diet cannot be advanced quite yet.   Patient and wife mentions that at baseline he has always had some degree of difficulty swallowing which he attributes to his cervical spine issues. SLP to reassess.  Diarrhea Has had profuse stool output presumably from his tube feedings.  He was given Imodium .  Flexi-Seal had to be inserted.  Still with significant output.  Continue Flexi-Seal for now.  Imodium  as needed.  History of sleep apnea Resume CPAP once feeding tube has been discontinued.  DVT Prophylaxis: On apixaban  Code Status: Full code Family Communication: Discussed with his wife Disposition Plan: Plan for CIR when bed is available.  His nausea and abdominal tenderness may delay discharge.    Medications: Scheduled:  acetaminophen  (TYLENOL ) oral liquid 160 mg/5 mL  500 mg Per Tube TID   acidophilus  1 capsule Oral Daily   apixaban   2.5 mg Per Tube BID   vitamin C   1,000 mg Per Tube Daily   atorvastatin   80 mg Per Tube Daily   Chlorhexidine  Gluconate Cloth  6 each Topical Q0600   feeding supplement (PROSource TF20)  60 mL Per Tube BID   ferrous sulfate   300 mg Per Tube Q breakfast   gabapentin   100 mg Per Tube QHS   insulin  aspart  0-15 Units Subcutaneous Q4H   insulin  aspart  10 Units Subcutaneous Q4H   insulin  glargine  22 Units Subcutaneous BID   midodrine   5 mg Per Tube Q8H   multivitamin with minerals  1 tablet Per Tube q AM   nutrition supplement (JUVEN)  1 packet Per Tube BID BM   mouth rinse  15 mL Mouth Rinse 4 times per day   pantoprazole  (PROTONIX ) IV  40 mg Intravenous BID   ramelteon   8 mg Oral QHS   sodium chloride  flush  3 mL Intravenous Q12H   tamsulosin   0.4 mg Oral QPC supper   Continuous:  feeding supplement (KATE FARMS STANDARD 1.4) Liquid 50 mL/hr at 12/24/24 0516    PRN:acetaminophen , albuterol , dextromethorphan , EPINEPHrine , haloperidol  lactate, hydrOXYzine , [COMPLETED] loperamide  HCl **FOLLOWED BY** loperamide  HCl, methocarbamol , mupirocin  ointment, naphazoline-glycerin , olopatadine , ondansetron , mouth rinse, oxyCODONE , phenol, sodium chloride  flush, traZODone   Objective:  Vital Signs  Vitals:   12/23/24 2013 12/24/24 0024 12/24/24 0500 12/24/24 0801  BP: (!) 102/56 (!) 105/54  (!) 110/45  Pulse: 65   78  Resp:    17  Temp: 97.6 F (36.4 C) 97.9 F (36.6 C) 97.6 F (36.4 C) 99 F (37.2 C)  TempSrc: Axillary Axillary Axillary Oral  SpO2: 97%   98%  Weight:   99.8 kg   Height:        Intake/Output Summary (Last 24 hours) at 12/24/2024 0933 Last data filed at 12/24/2024 0516 Gross per 24 hour  Intake 1595.83 ml  Output 2650 ml  Net -1054.17 ml   Filed Weights   12/23/24 0417 12/23/24 1244 12/24/24 0500  Weight: 101.3 kg 105.5 kg 99.8 kg    General appearance: Awake alert.  In no distress Resp: Clear to auscultation bilaterally.  Normal effort Cardio: S1-S2 is normal regular.  No S3-S4.  No rubs murmurs or bruit GI: Abdomen is soft.  Tender in the left lower quadrant without any rebound rigidity or guarding.  No masses organomegaly.  Bowel sounds present. Extremities: No focal neurological deficits.  Physical deconditioning noted.  He is status post bilateral lower extremity amputation.  Lab Results:  Data Reviewed: I have personally reviewed following labs and reports of the imaging studies  CBC: Recent Labs  Lab 12/18/24 0515 12/19/24 0457 12/23/24 0420  WBC 9.6 10.7* 11.6*  HGB 8.5* 8.4* 8.5*  HCT 24.9* 25.1* 26.0*  MCV 89.2 90.9 91.2  PLT 193 224 327    Basic Metabolic Panel: Recent Labs  Lab 12/20/24 0430 12/21/24 0511 12/22/24 0500 12/23/24 0420 12/24/24 0448  NA 135 135 137 136 136  K 4.2 4.2 4.7 4.9 4.7  CL 97* 96* 98 99 95*  CO2 26 29 29 28 30   GLUCOSE 136* 210* 155* 109* 123*  BUN 118* 71* 102* 110*  77*  CREATININE 3.77* 2.28* 2.74* 2.72* 2.31*  CALCIUM  9.0 9.0 8.9 9.0 9.0  MG 2.6* 2.2 2.4 2.4 2.2  PHOS 4.2 2.9 3.9 3.7 4.3    GFR: Estimated Creatinine Clearance: 29.6 mL/min (A) (by C-G formula based on SCr of 2.31 mg/dL (H)).  Liver Function Tests: Recent Labs  Lab 12/20/24 0430 12/21/24 0511 12/22/24 0500 12/23/24 0420 12/24/24 0448  AST  --   --   --  40  --   ALT  --   --   --  35  --   ALKPHOS  --   --   --  127*  --   BILITOT  --   --   --  0.3  --   PROT  --   --   --  6.7  --   ALBUMIN  3.1* 3.3* 3.2* 3.1*  3.1* 3.3*    CBG: Recent Labs  Lab 12/23/24 2020 12/24/24 0023 12/24/24 0127 12/24/24 0440 12/24/24 0801  GLUCAP 132* 77 91 125* 127*     Radiology Studies: No results found.      LOS: 13 days   Addaleigh Nicholls [employer] on [email or web address]  12/24/2024, 9:33 AM   "

## 2024-12-25 ENCOUNTER — Encounter (HOSPITAL_COMMUNITY): Payer: Self-pay | Admitting: Physical Medicine & Rehabilitation

## 2024-12-25 ENCOUNTER — Inpatient Hospital Stay (HOSPITAL_COMMUNITY)
Admission: AD | Admit: 2024-12-25 | Discharge: 2025-01-10 | DRG: 559 | Disposition: A | Discharge status: Home Health | Source: Intra-hospital | Attending: Physical Medicine & Rehabilitation | Admitting: Physical Medicine & Rehabilitation

## 2024-12-25 ENCOUNTER — Other Ambulatory Visit: Payer: Self-pay

## 2024-12-25 DIAGNOSIS — Z89512 Acquired absence of left leg below knee: Principal | ICD-10-CM | POA: Diagnosis present

## 2024-12-25 DIAGNOSIS — D649 Anemia, unspecified: Secondary | ICD-10-CM

## 2024-12-25 DIAGNOSIS — Z87898 Personal history of other specified conditions: Secondary | ICD-10-CM

## 2024-12-25 DIAGNOSIS — I998 Other disorder of circulatory system: Secondary | ICD-10-CM | POA: Diagnosis not present

## 2024-12-25 DIAGNOSIS — R7309 Other abnormal glucose: Secondary | ICD-10-CM

## 2024-12-25 DIAGNOSIS — Z860100 Personal history of colon polyps, unspecified: Secondary | ICD-10-CM

## 2024-12-25 DIAGNOSIS — Z7901 Long term (current) use of anticoagulants: Secondary | ICD-10-CM

## 2024-12-25 LAB — MAGNESIUM: Magnesium: 2.4 mg/dL (ref 1.7–2.4)

## 2024-12-25 LAB — RENAL FUNCTION PANEL
Albumin: 3.3 g/dL — ABNORMAL LOW (ref 3.5–5.0)
Anion gap: 10 (ref 5–15)
BUN: 99 mg/dL — ABNORMAL HIGH (ref 8–23)
CO2: 29 mmol/L (ref 22–32)
Calcium: 8.9 mg/dL (ref 8.9–10.3)
Chloride: 94 mmol/L — ABNORMAL LOW (ref 98–111)
Creatinine, Ser: 2.73 mg/dL — ABNORMAL HIGH (ref 0.61–1.24)
GFR, Estimated: 22 mL/min — ABNORMAL LOW
Glucose, Bld: 128 mg/dL — ABNORMAL HIGH (ref 70–99)
Phosphorus: 4.7 mg/dL — ABNORMAL HIGH (ref 2.5–4.6)
Potassium: 5.1 mmol/L (ref 3.5–5.1)
Sodium: 133 mmol/L — ABNORMAL LOW (ref 135–145)

## 2024-12-25 LAB — CBC
HCT: 27.1 % — ABNORMAL LOW (ref 39.0–52.0)
Hemoglobin: 8.9 g/dL — ABNORMAL LOW (ref 13.0–17.0)
MCH: 30.2 pg (ref 26.0–34.0)
MCHC: 32.8 g/dL (ref 30.0–36.0)
MCV: 91.9 fL (ref 80.0–100.0)
Platelets: 331 K/uL (ref 150–400)
RBC: 2.95 MIL/uL — ABNORMAL LOW (ref 4.22–5.81)
RDW: 14.8 % (ref 11.5–15.5)
WBC: 12.5 K/uL — ABNORMAL HIGH (ref 4.0–10.5)
nRBC: 0 % (ref 0.0–0.2)

## 2024-12-25 LAB — GLUCOSE, CAPILLARY
Glucose-Capillary: 114 mg/dL — ABNORMAL HIGH (ref 70–99)
Glucose-Capillary: 115 mg/dL — ABNORMAL HIGH (ref 70–99)
Glucose-Capillary: 127 mg/dL — ABNORMAL HIGH (ref 70–99)
Glucose-Capillary: 161 mg/dL — ABNORMAL HIGH (ref 70–99)
Glucose-Capillary: 171 mg/dL — ABNORMAL HIGH (ref 70–99)
Glucose-Capillary: 65 mg/dL — ABNORMAL LOW (ref 70–99)
Glucose-Capillary: 76 mg/dL (ref 70–99)

## 2024-12-25 MED ORDER — INSULIN ASPART 100 UNIT/ML IJ SOLN
0.0000 [IU] | INTRAMUSCULAR | Status: DC
  Administered 2024-12-26 (×4): 2 [IU] via SUBCUTANEOUS
  Administered 2024-12-27: 3 [IU] via SUBCUTANEOUS
  Administered 2024-12-27 (×2): 2 [IU] via SUBCUTANEOUS
  Administered 2024-12-27: 3 [IU] via SUBCUTANEOUS
  Administered 2024-12-28 – 2024-12-29 (×2): 2 [IU] via SUBCUTANEOUS
  Administered 2024-12-29: 3 [IU] via SUBCUTANEOUS
  Administered 2024-12-29 (×2): 2 [IU] via SUBCUTANEOUS
  Administered 2024-12-30: 5 [IU] via SUBCUTANEOUS
  Administered 2024-12-30: 2 [IU] via SUBCUTANEOUS
  Filled 2024-12-25 (×9): qty 2
  Filled 2024-12-25: qty 3
  Filled 2024-12-25: qty 5
  Filled 2024-12-25: qty 3
  Filled 2024-12-25 (×2): qty 2

## 2024-12-25 MED ORDER — NAPHAZOLINE-GLYCERIN 0.012-0.25 % OP SOLN: 1.0000 [drp] | Freq: Four times a day (QID) | OPHTHALMIC | Status: DC | PRN

## 2024-12-25 MED ORDER — OXYCODONE HCL 5 MG PO TABS
2.5000 mg | ORAL_TABLET | ORAL | Status: DC | PRN
  Administered 2024-12-25 – 2024-12-27 (×5): 2.5 mg
  Filled 2024-12-25 (×5): qty 1

## 2024-12-25 MED ORDER — PANTOPRAZOLE SODIUM 40 MG IV SOLR
40.0000 mg | Freq: Two times a day (BID) | INTRAVENOUS | Status: DC
  Administered 2024-12-25 – 2024-12-27 (×4): 40 mg via INTRAVENOUS
  Filled 2024-12-25 (×4): qty 10

## 2024-12-25 MED ORDER — MUPIROCIN 2 % EX OINT
1.0000 | TOPICAL_OINTMENT | Freq: Every day | CUTANEOUS | Status: DC | PRN
  Administered 2025-01-05: 1 via TOPICAL
  Filled 2024-12-25: qty 22

## 2024-12-25 MED ORDER — RAMELTEON 8 MG PO TABS: 8.0000 mg | ORAL_TABLET | Freq: Every day | ORAL | Status: DC

## 2024-12-25 MED ORDER — CHLORHEXIDINE GLUCONATE CLOTH 2 % EX PADS: 6.0000 | MEDICATED_PAD | Freq: Every day | CUTANEOUS | Status: DC

## 2024-12-25 MED ORDER — OXYCODONE HCL 5 MG PO TABS: 2.5000 mg | ORAL_TABLET | ORAL | Status: DC | PRN

## 2024-12-25 MED ORDER — LOPERAMIDE HCL 1 MG/7.5ML PO SUSP: 4.0000 mg | Freq: Three times a day (TID) | ORAL | Status: DC | PRN

## 2024-12-25 MED ORDER — HYDROXYZINE HCL 25 MG PO TABS
25.0000 mg | ORAL_TABLET | Freq: Three times a day (TID) | ORAL | Status: DC | PRN
  Administered 2024-12-25: 25 mg
  Filled 2024-12-25: qty 1

## 2024-12-25 MED ORDER — ACETAMINOPHEN 160 MG/5ML PO SOLN: 500.0000 mg | Freq: Three times a day (TID) | ORAL | Status: DC

## 2024-12-25 MED ORDER — ORAL CARE MOUTH RINSE
15.0000 mL | OROMUCOSAL | Status: DC
  Administered 2024-12-25 – 2025-01-10 (×54): 15 mL via OROMUCOSAL

## 2024-12-25 MED ORDER — DEXTROMETHORPHAN POLISTIREX ER 30 MG/5ML PO SUER: 15.0000 mg | Freq: Every day | ORAL | Status: DC | PRN

## 2024-12-25 MED ORDER — HEPARIN SODIUM (PORCINE) 1000 UNIT/ML IJ SOLN
INTRAMUSCULAR | Status: AC
  Filled 2024-12-25: qty 3

## 2024-12-25 MED ORDER — INSULIN GLARGINE 100 UNIT/ML ~~LOC~~ SOLN: 18.0000 [IU] | Freq: Two times a day (BID) | SUBCUTANEOUS | Status: DC

## 2024-12-25 MED ORDER — ORAL CARE MOUTH RINSE: 15.0000 mL | OROMUCOSAL | Status: DC | PRN

## 2024-12-25 MED ORDER — TAMSULOSIN HCL 0.4 MG PO CAPS
0.4000 mg | ORAL_CAPSULE | Freq: Every day | ORAL | Status: DC
  Administered 2024-12-25 – 2025-01-09 (×15): 0.4 mg via ORAL
  Filled 2024-12-25 (×17): qty 1

## 2024-12-25 MED ORDER — OLOPATADINE HCL 0.1 % OP SOLN: 1.0000 [drp] | Freq: Every day | OPHTHALMIC | Status: DC | PRN

## 2024-12-25 MED ORDER — VITAMIN C 500 MG PO TABS
1000.0000 mg | ORAL_TABLET | Freq: Every day | ORAL | Status: DC
  Administered 2024-12-25 – 2024-12-27 (×3): 1000 mg
  Filled 2024-12-25 (×3): qty 2

## 2024-12-25 MED ORDER — HEPARIN SODIUM (PORCINE) 1000 UNIT/ML DIALYSIS: 40.0000 [IU]/kg | INTRAMUSCULAR | Status: DC | PRN

## 2024-12-25 MED ORDER — TRAZODONE HCL 150 MG PO TABS: 75.0000 mg | ORAL_TABLET | Freq: Every evening | ORAL | Status: DC | PRN

## 2024-12-25 MED ORDER — APIXABAN 2.5 MG PO TABS
2.5000 mg | ORAL_TABLET | Freq: Two times a day (BID) | ORAL | Status: DC
  Administered 2024-12-25 – 2024-12-27 (×4): 2.5 mg
  Filled 2024-12-25 (×4): qty 1

## 2024-12-25 MED ORDER — ADULT MULTIVITAMIN W/MINERALS CH: 1.0000 | ORAL_TABLET | Freq: Every morning | ORAL | Status: AC

## 2024-12-25 MED ORDER — PANTOPRAZOLE SODIUM 40 MG IV SOLR: 40.0000 mg | Freq: Two times a day (BID) | INTRAVENOUS | Status: DC

## 2024-12-25 MED ORDER — METHOCARBAMOL 500 MG PO TABS: 500.0000 mg | ORAL_TABLET | Freq: Three times a day (TID) | ORAL | Status: DC | PRN

## 2024-12-25 MED ORDER — MIDODRINE HCL 5 MG PO TABS: 5.0000 mg | ORAL_TABLET | Freq: Three times a day (TID) | ORAL | Status: DC

## 2024-12-25 MED ORDER — FERROUS SULFATE 300 (60 FE) MG/5ML PO SOLN
300.0000 mg | Freq: Every day | ORAL | Status: DC
  Administered 2024-12-26 – 2024-12-27 (×2): 300 mg
  Filled 2024-12-25 (×2): qty 5

## 2024-12-25 MED ORDER — INSULIN GLARGINE 100 UNIT/ML ~~LOC~~ SOLN
18.0000 [IU] | Freq: Two times a day (BID) | SUBCUTANEOUS | Status: DC
  Administered 2024-12-25 – 2025-01-02 (×16): 18 [IU] via SUBCUTANEOUS
  Filled 2024-12-25 (×18): qty 0.18

## 2024-12-25 MED ORDER — ACETAMINOPHEN 325 MG PO TABS: 325.0000 mg | ORAL_TABLET | Freq: Four times a day (QID) | ORAL | Status: DC | PRN

## 2024-12-25 MED ORDER — INSULIN ASPART 100 UNIT/ML IJ SOLN: 0.0000 [IU] | INTRAMUSCULAR | Status: AC

## 2024-12-25 MED ORDER — MIDODRINE HCL 5 MG PO TABS
5.0000 mg | ORAL_TABLET | Freq: Three times a day (TID) | ORAL | Status: DC
  Administered 2024-12-25 – 2024-12-27 (×5): 5 mg
  Filled 2024-12-25 (×5): qty 1

## 2024-12-25 MED ORDER — INSULIN ASPART 100 UNIT/ML IJ SOLN
4.0000 [IU] | INTRAMUSCULAR | Status: DC
  Administered 2024-12-26 (×5): 4 [IU] via SUBCUTANEOUS
  Filled 2024-12-25 (×4): qty 4

## 2024-12-25 MED ORDER — ATORVASTATIN CALCIUM 80 MG PO TABS: 80.0000 mg | ORAL_TABLET | Freq: Every day | ORAL | Status: DC

## 2024-12-25 MED ORDER — PROSOURCE TF20 ENFIT COMPATIBL EN LIQD: 60.0000 mL | Freq: Two times a day (BID) | ENTERAL | Status: DC

## 2024-12-25 MED ORDER — INSULIN ASPART 100 UNIT/ML IJ SOLN: 4.0000 [IU] | INTRAMUSCULAR | Status: DC

## 2024-12-25 MED ORDER — PHENOL 1.4 % MT LIQD: 1.0000 | OROMUCOSAL | Status: DC | PRN

## 2024-12-25 MED ORDER — HYDROXYZINE HCL 25 MG PO TABS
25.0000 mg | ORAL_TABLET | Freq: Three times a day (TID) | ORAL | Status: DC | PRN
  Administered 2024-12-26 – 2024-12-27 (×2): 25 mg
  Filled 2024-12-25 (×2): qty 1

## 2024-12-25 MED ORDER — KATE FARMS STANDARD 1.4 EN LIQD: 1000.0000 mL | ENTERAL | Status: DC

## 2024-12-25 MED ORDER — RISAQUAD PO CAPS
1.0000 | ORAL_CAPSULE | Freq: Every day | ORAL | Status: DC
  Administered 2024-12-25 – 2025-01-10 (×17): 1 via ORAL
  Filled 2024-12-25 (×17): qty 1

## 2024-12-25 MED ORDER — ONDANSETRON HCL 4 MG/2ML IJ SOLN
4.0000 mg | Freq: Four times a day (QID) | INTRAMUSCULAR | Status: DC | PRN
  Administered 2024-12-25 – 2025-01-06 (×13): 4 mg via INTRAVENOUS
  Filled 2024-12-25 (×15): qty 2

## 2024-12-25 MED ORDER — HYDROXYZINE HCL 25 MG PO TABS: 25.0000 mg | ORAL_TABLET | Freq: Three times a day (TID) | ORAL | Status: DC | PRN

## 2024-12-25 MED ORDER — PROSOURCE TF20 ENFIT COMPATIBL EN LIQD
60.0000 mL | Freq: Two times a day (BID) | ENTERAL | Status: DC
  Administered 2024-12-25 – 2024-12-26 (×3): 60 mL
  Filled 2024-12-25 (×4): qty 60

## 2024-12-25 MED ORDER — ALBUTEROL SULFATE (2.5 MG/3ML) 0.083% IN NEBU
2.5000 mg | INHALATION_SOLUTION | RESPIRATORY_TRACT | Status: DC | PRN
  Administered 2024-12-28 – 2025-01-08 (×5): 2.5 mg via RESPIRATORY_TRACT
  Filled 2024-12-25 (×5): qty 3

## 2024-12-25 MED ORDER — APIXABAN 2.5 MG PO TABS: 2.5000 mg | ORAL_TABLET | Freq: Two times a day (BID) | ORAL | Status: DC

## 2024-12-25 MED ORDER — TAMSULOSIN HCL 0.4 MG PO CAPS: 0.4000 mg | ORAL_CAPSULE | Freq: Every day | ORAL | Status: DC

## 2024-12-25 MED ORDER — JUVEN PO PACK: 1.0000 | PACK | Freq: Two times a day (BID) | ORAL | Status: DC

## 2024-12-25 MED ORDER — KATE FARMS STANDARD 1.4 EN LIQD
1000.0000 mL | ENTERAL | Status: DC
  Administered 2024-12-25 – 2024-12-27 (×3): 1000 mL
  Filled 2024-12-25 (×7): qty 1000

## 2024-12-25 MED ORDER — GABAPENTIN 250 MG/5ML PO SOLN: 100.0000 mg | Freq: Every day | ORAL | Status: DC

## 2024-12-25 MED ORDER — JUVEN PO PACK
1.0000 | PACK | Freq: Two times a day (BID) | ORAL | Status: DC
  Administered 2024-12-26 – 2024-12-27 (×2): 1
  Filled 2024-12-25 (×3): qty 1

## 2024-12-25 MED ORDER — ATORVASTATIN CALCIUM 80 MG PO TABS
80.0000 mg | ORAL_TABLET | Freq: Every day | ORAL | Status: DC
  Administered 2024-12-26 – 2024-12-27 (×2): 80 mg
  Filled 2024-12-25 (×2): qty 1

## 2024-12-25 MED ORDER — METHOCARBAMOL 500 MG PO TABS
500.0000 mg | ORAL_TABLET | Freq: Three times a day (TID) | ORAL | Status: DC | PRN
  Administered 2024-12-25: 500 mg
  Filled 2024-12-25: qty 1

## 2024-12-25 MED ORDER — FERROUS SULFATE 300 (60 FE) MG/5ML PO SOLN: 300.0000 mg | Freq: Every day | ORAL | Status: DC

## 2024-12-25 MED ORDER — TRAZODONE HCL 50 MG PO TABS
75.0000 mg | ORAL_TABLET | Freq: Every evening | ORAL | Status: DC | PRN
  Filled 2024-12-25: qty 2

## 2024-12-25 MED ORDER — ADULT MULTIVITAMIN W/MINERALS CH
1.0000 | ORAL_TABLET | Freq: Every morning | ORAL | Status: DC
  Administered 2024-12-27: 1
  Filled 2024-12-25 (×2): qty 1

## 2024-12-25 MED ORDER — GABAPENTIN 250 MG/5ML PO SOLN
100.0000 mg | Freq: Every day | ORAL | Status: DC
  Administered 2024-12-25 – 2024-12-26 (×2): 100 mg
  Filled 2024-12-25 (×2): qty 2

## 2024-12-25 MED ORDER — ASCORBIC ACID 1000 MG PO TABS: 1000.0000 mg | ORAL_TABLET | Freq: Every day | ORAL | Status: DC

## 2024-12-25 MED ORDER — RAMELTEON 8 MG PO TABS
8.0000 mg | ORAL_TABLET | Freq: Every day | ORAL | Status: DC
  Administered 2024-12-25 – 2024-12-26 (×2): 8 mg
  Filled 2024-12-25 (×2): qty 1

## 2024-12-25 MED ORDER — HEPARIN SODIUM (PORCINE) 1000 UNIT/ML IJ SOLN
2400.0000 [IU] | Freq: Once | INTRAMUSCULAR | Status: AC
  Administered 2024-12-25: 2400 [IU]

## 2024-12-25 NOTE — TOC Transition Note (Signed)
 Transition of Care [facility]) - Discharge Note   Patient Details  Name: Shane [name] MRN: [id number] Date of Birth: October 13, 1941  [facility]) CM/SW Contact:  Sudie Erminio Deems, RN Phone Number: 12/25/2024, 2:37 PM   Clinical Narrative: Patient will discharge to Inpatient Rehab. No further needs identified at this time.     Final next level of care: IP Rehab Facility Barriers to Discharge: No Barriers Identified   Patient Goals and CMS Choice Patient states their goals for this hospitalization and ongoing recovery are:: CIR is following the patient for IP rehab   Discharge Plan and Services Additional resources added to the After Visit Summary for     Discharge Planning Services: CM Consult Post Acute Care Choice: IP Rehab            HH Arranged: NA   Social Drivers of Health (SDOH) Interventions SDOH Screenings   Food Insecurity: No Food Insecurity (12/11/2024)  Housing: Low Risk (12/11/2024)  Transportation Needs: No Transportation Needs (12/11/2024)  Utilities: Not At Risk (12/11/2024)  Financial Resource Strain: Low Risk  (08/22/2023)   Received from [facility]  Social Connections: Moderately Integrated (12/18/2024)  Tobacco Use: Low Risk (12/04/2024)   Readmission Risk Interventions    12/15/2023    2:29 PM  Readmission Risk Prevention Plan  Transportation Screening Complete  Medication Review (RN Care Manager) Complete  PCP or Specialist appointment within 3-5 days of discharge Complete  HRI or Home Care Consult Complete  SW Recovery Care/Counseling Consult Complete  Palliative Care Screening Complete  Skilled Nursing Facility Not Applicable

## 2024-12-25 NOTE — Progress Notes (Signed)
 Patient ID: Shane [name], male   DOB: [birth year], 84 y.o.   MRN: [id number] [facility] Progress Note   Assessment/ Plan:   1. Acute kidney Injury on chronic kidney disease stage III: Secondary to ATN with dependency on CRRT between 1/10 - 1/12 and then transitioned onto intermittent hemodialysis on MWF schedule.  No urine output charted from overnight however wife feels that he has been urinating but just not collecting reliably.  On dialysis at this time and noted to be oliguric overnight (500 cc urine output).  Labs from this morning not supportive of renal recovery and I will consult interventional radiology for conversion to tunneled dialysis catheter when leukocytosis has resolved. 2.  Septic shock: Secondary to gangrene of left foot status post left BKA on 12/11/2024.  Transitioned to midodrine  after previously requiring pressors.  Completed Zyvox  and Zosyn . 3.  Paroxysmal atrial fibrillation: Rate controlled with ongoing anticoagulation with Eliquis . 4.  Anemia: Secondary to chronic illness and recent exacerbation by postop losses.  Will continue to monitor to determine need for adjustment of ESA therapy. 5.  Status post left below-knee amputation: With evidence of peripheral vascular disease and gangrene of left foot.  Subjective:   Denies any acute events overnight.  Accepted to CIR with bed offer possibly for today.   Objective:   BP (!) 123/48   Pulse 65   Temp 97.7 F (36.5 C)   Resp 13   Ht 6' (1.829 m)   Wt 98.3 kg   SpO2 100%   BMI 29.39 kg/m   Intake/Output Summary (Last 24 hours) at 12/25/2024 0951 Last data filed at 12/25/2024 0455 Gross per 24 hour  Intake 366.67 ml  Output 500 ml  Net -133.33 ml   Weight change: -1.173 kg  Physical Exam: Gen: Appears comfortable resting in dialysis CVS: Pulse regular rhythm, normal rate, S1 and S2 normal Resp: Clear to auscultation bilaterally, no rales/rhonchi.  Right IJ temporary dialysis catheter connected to  dialysis Abd: Soft, obese, nontender, bowel sounds normal Ext: Status post left BKA with wound VAC in situ.  Status post prior right BKA.  Imaging: CT ABDOMEN PELVIS WO CONTRAST Result Date: 12/25/2024 CLINICAL DATA:  Abdominal pain. EXAM: CT ABDOMEN AND PELVIS WITHOUT CONTRAST TECHNIQUE: Multidetector CT imaging of the abdomen and pelvis was performed following the standard protocol without IV contrast. RADIATION DOSE REDUCTION: This exam was performed according to the departmental dose-optimization program which includes automated exposure control, adjustment of the mA and/or kV according to patient size and/or use of iterative reconstruction technique. COMPARISON:  07/23/2024 of FINDINGS: Lower chest: Chronic interstitial lung disease noted in both lower lungs is similar to prior. The heart is enlarged. Status post TAVR. Hepatobiliary: No suspicious focal abnormality in the liver on this study without intravenous contrast. There is no evidence for gallstones, gallbladder wall thickening, or pericholecystic fluid. No intrahepatic or extrahepatic biliary dilation. Pancreas: No focal mass lesion. No dilatation of the main duct. No intraparenchymal cyst. No peripancreatic edema. Spleen: No splenomegaly. No suspicious focal mass lesion. Adrenals/Urinary Tract: No adrenal nodule or mass. Kidneys unremarkable. No evidence for hydroureter. The urinary bladder appears normal for the degree of distention. Stomach/Bowel: Stomach is unremarkable. No gastric wall thickening. No evidence of outlet obstruction. Feeding tube tip is in the descending duodenum. No small bowel wall thickening. No small bowel dilatation. The terminal ileum is normal. The appendix is normal. No gross colonic mass. No colonic wall thickening. Rectal tube evident. Vascular/Lymphatic: There is moderate atherosclerotic calcification  of the abdominal aorta without aneurysm. There is no gastrohepatic or hepatoduodenal ligament lymphadenopathy. No  retroperitoneal or mesenteric lymphadenopathy. No pelvic sidewall lymphadenopathy. Reproductive: The prostate gland and seminal vesicles are unremarkable. Other: No substantial intraperitoneal free fluid. Rim enhancing retroperitoneal collection seen on the previous study has decreased in the interval measuring 4.0 x 1.6 cm today compared to 5.3 x 2.7 cm previously. Features likely reflect resolving hematoma or residual granulation/scar from prior infection. Musculoskeletal: No worrisome lytic or sclerotic osseous abnormality. IMPRESSION: 1. No acute findings in the abdomen or pelvis. Specifically, no findings to explain the patient's history of abdominal pain. 2. Interval decrease in size of the right retroperitoneal collection seen on the previous study. Previous study performed with contrast shows some peripheral enhancement of this collection. Features likely reflect chronic resolving hematoma or residual granulation/scar from prior infection. 3. Chronic interstitial lung disease in both lower lungs, similar to prior. 4.  Aortic Atherosclerosis (ICD10-I70.0). Electronically Signed   By: Camellia Candle M.D.   On: 12/25/2024 05:36   DG Abd Portable 1V Result Date: 12/24/2024 CLINICAL DATA:  Nausea. EXAM: PORTABLE ABDOMEN - 1 VIEW COMPARISON:  12/13/2024 and 07/24/2024 FINDINGS: Enteric tube is present with tip right of midline in the mid abdomen likely over the distal duodenal C sweep. Bowel gas pattern is nonobstructive. No free peritoneal air. Significant degenerative changes of the spine and hips. IMPRESSION: 1. Nonobstructive bowel gas pattern. 2. Enteric tube with tip over the distal duodenal C sweep. Electronically Signed   By: Toribio Agreste M.D.   On: 12/24/2024 09:35    Labs: BMET Recent Labs  Lab 12/19/24 0457 12/20/24 0430 12/21/24 0511 12/22/24 0500 12/23/24 0420 12/24/24 0448 12/25/24 0552  NA 136 135 135 137 136 136 133*  K 3.9 4.2 4.2 4.7 4.9 4.7 5.1  CL 97* 97* 96* 98 99 95* 94*   CO2 26 26 29 29 28 30 29   GLUCOSE 173* 136* 210* 155* 109* 123* 128*  BUN 93* 118* 71* 102* 110* 77* 99*  CREATININE 3.34* 3.77* 2.28* 2.74* 2.72* 2.31* 2.73*  CALCIUM  8.9 9.0 9.0 8.9 9.0 9.0 8.9  PHOS 3.9 4.2 2.9 3.9 3.7 4.3 4.7*   CBC Recent Labs  Lab 12/19/24 0457 12/23/24 0420 12/24/24 1157 12/25/24 0552  WBC 10.7* 11.6* 14.3* 12.5*  NEUTROABS  --   --  10.8*  --   HGB 8.4* 8.5* 9.2* 8.9*  HCT 25.1* 26.0* 28.3* 27.1*  MCV 90.9 91.2 90.4 91.9  PLT 224 327 359 331    Medications:     acetaminophen  (TYLENOL ) oral liquid 160 mg/5 mL  500 mg Per Tube TID   acidophilus  1 capsule Oral Daily   apixaban   2.5 mg Per Tube BID   vitamin C   1,000 mg Per Tube Daily   atorvastatin   80 mg Per Tube Daily   Chlorhexidine  Gluconate Cloth  6 each Topical Q0600   feeding supplement (PROSource TF20)  60 mL Per Tube BID   ferrous sulfate   300 mg Per Tube Q breakfast   gabapentin   100 mg Per Tube QHS   insulin  aspart  0-15 Units Subcutaneous Q4H   insulin  aspart  4 Units Subcutaneous Q4H   insulin  glargine  18 Units Subcutaneous BID   midodrine   5 mg Per Tube Q8H   multivitamin with minerals  1 tablet Per Tube q AM   nutrition supplement (JUVEN)  1 packet Per Tube BID BM   mouth rinse  15 mL Mouth Rinse 4  times per day   pantoprazole  (PROTONIX ) IV  40 mg Intravenous BID   ramelteon   8 mg Oral QHS   sodium chloride  flush  3 mL Intravenous Q12H   tamsulosin   0.4 mg Oral QPC supper    Gordy Blanch, MD 12/25/2024, 9:51 AM

## 2024-12-25 NOTE — H&P (Signed)
 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 Laneta Arzella Holland of [facility], CKD stage III, diabetes mellitus, GERD, hyperlipidemia, hypertension, idiopathic pulmonary fibrosis/OSA on CPAP followed by Dr. 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  Per chart review patient lives with spouse and grandson.  Two-level home bed and bath main level with ramped entrance.  At baseline, he is modified independent with the use of a scooter in the home and community, and transfers independently with intermittent use of right lower extremity prosthesis to assist.  Presented 12/11/2024 with gangrenous ischemic changes to the left foot with reported rest pain.  Limb was not felt to be salvageable undergoing left BKA 12/11/2024 per Dr. Harden with application of wound VAC.  Postoperative 1/8 he became hypotensive and subsequently transferred to the ICU, required pressor support which had been since weaned off to midodrine  5 mg every 8 hours as well as placed on a course of linezolid /Zosyn  for suspect sepsis and completing course.  Echocardiogram with ejection fraction of 30 to 35% grade 3 diastolic dysfunction.  Hospitalization was complicated by AKI on CKD stage III requiring CRRT 1/10 - 1/12 with follow-up nephrology services transitioned into intermittent hemodialysis .  Initially with Foley catheter tube urine output has picked up and voiding via external catheter.  Renal ultrasound 12/15/2024  showed no hydronephrosis and latest creatinine 2.73.  Patient with intermittent bouts of agitation and restlessness suspect encephalopathy/ICU delirium that has steadily improved as patient attends therapies.  Acute on chronic anemia latest hemoglobin 8.9.  Patient was cleared to resume chronic Eliquis  for history of atrial fibrillation.  Speech therapy consulted for oropharyngeal dysphagia/moderate protein calorie malnutrition a Cortrak tube was placed currently remains n.p.o. patient did have profuse stool output presumably from tube feeds and a Flexi-Seal was inserted.  Therapy evaluations completed due to patient's decreased functional mobility was admitted for a comprehensive rehab program. Patient is currently receiving HD.   Review of Systems  Constitutional:  Positive for malaise/fatigue. Negative for chills and fever.  HENT:  Negative for hearing loss.   Eyes:  Positive for blurred vision and double vision.  Respiratory:  Negative for cough and wheezing.        Shortness of breath with heavy exertion and  Cardiovascular:  Positive for palpitations and leg swelling. Negative for chest pain.  Gastrointestinal:  Positive for diarrhea and nausea. Negative for vomiting.       GERD  Genitourinary:  Positive for frequency and urgency. Negative for dysuria, flank pain and hematuria.  Musculoskeletal:  Positive for joint pain and myalgias.  Skin:  Negative for rash.  Neurological:  Positive for dizziness and weakness.  Psychiatric/Behavioral:  Positive for depression and memory loss. The patient has insomnia.        Anxiety  All other systems reviewed and are negative.  Past Medical History:  Diagnosis Date   Acute blood loss anemia    Anxiety    AV block, Mobitz 1    Cataract    Chronic kidney disease    d/t DM   CKD (chronic kidney disease), stage  III (HCC)    COVID-19 virus infection 07/18/2020   Last Assessment & Plan:   Formatting of this note might be different from the original.   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 10/2019 Echo: EF 55-60%, Gr2 DD. Nl RV.    Neuromuscular disorder (HCC)    Neuropathy associated with endocrine disorder    Nonobstructive CAD (coronary artery disease)    a. 2012 Cath: mod, nonobs dzs; b. 10/2016 MV: EF 60%, no ischemia.   OSA on CPAP 05/05/2017   Unattended Home Sleep Test 7/2/813-AHI 38.6/hour, desaturation to 64%, body weight 261 pounds   PONV (postoperative nausea and vomiting)    Postoperative anemia due to acute blood loss 11/07/2020   Postoperative hemorrhagic shock 03/20/2021   Sleep apnea     uses cpap asked to bring mask and tubing   Past Surgical History:  Procedure Laterality Date   ABDOMINAL AORTOGRAM W/LOWER EXTREMITY N/A 12/10/2020   Procedure: ABDOMINAL AORTOGRAM W/LOWER EXTREMITY;  Surgeon: Magda Debby SAILOR, MD;  Location: [facility] INVASIVE CV LAB;  Service: Cardiovascular;  Laterality: N/A;   AMPUTATION Right 01/22/2021   Procedure: 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;  Surgeon: Harden Jerona GAILS, MD;  Location: [facility] OR;  Service: Orthopedics;  Laterality: Right;   AMPUTATION Right 03/17/2021   Procedure: RIGHT BELOW KNEE AMPUTATION;  Surgeon: Harden Jerona GAILS, MD;  Location: [facility] OR;  Service: Orthopedics;  Laterality: Right;   AMPUTATION Left 12/11/2024   Procedure: AMPUTATION BELOW KNEE;  Surgeon: Harden Jerona GAILS, MD;  Location: [facility] OR;  Service: Orthopedics;  Laterality: Left;   ANKLE FUSION Right 01/22/2021   Procedure: RIGHT FOOT TIBIOCALCANEAL FUSION;  Surgeon: Harden Jerona GAILS, MD;   Location: [facility] OR;  Service: Orthopedics;  Laterality: Right;   ANTERIOR FUSION CERVICAL SPINE  2012   APPLICATION OF WOUND VAC Left 12/11/2024   Procedure: APPLICATION, WOUND VAC;  Surgeon: Harden Jerona GAILS, MD;  Location: [facility] OR;  Service: Orthopedics;  Laterality: Left;   CARDIAC CATHETERIZATION  2011   CARDIAC CATHETERIZATION N/A 11/09/2016   Procedure: Right Heart Cath;  Surgeon: Victory LELON Sharps, MD;  Location: [facility] INVASIVE CV LAB;  Service: Cardiovascular;  Laterality: N/A;   carpel tunnel     left wrist   CATARACT EXTRACTION     CATARACT EXTRACTION W/ INTRAOCULAR LENS  IMPLANT, BILATERAL  2013   CERVICAL LAMINECTOMY  2012   COLONOSCOPY N/A 01/14/2013   Procedure: COLONOSCOPY;  Surgeon: Norleen SAILOR Kiang, MD;  Location: [facility] ENDOSCOPY;  Service: Endoscopy;  Laterality: N/A;   CORONARY ARTERY BYPASS GRAFT  11/04/2020   LIMA-LAD, SVG-OM1, SVG-PDA (Dr Norleen Exon [facility]) dc 11/18/2020   EYE SURGERY     I & D EXTREMITY Right 02/19/2021   Procedure: RIGHT ANKLE DEBRIDEMENT AND PLACEMENT ANTIBIOTIC BEADS;  Surgeon: Harden Jerona GAILS, MD;  Location: [facility] OR;  Service: Orthopedics;  Laterality: Right;   KNEE SURGERY  1998   left   LEFT HEART CATH AND CORONARY  ANGIOGRAPHY N/A 07/10/2020   Procedure: LEFT HEART CATH AND CORONARY ANGIOGRAPHY;  Surgeon: Wonda Sharper, MD;  Location: [facility] INVASIVE CV LAB;  Service: Cardiovascular;  Laterality: N/A;   LUMBAR LAMINECTOMY  2003   LUNG BIOPSY Left 12/26/2016   Procedure: LUNG BIOPSY;  Surgeon: Elspeth JAYSON Millers, MD;  Location: [facility] OR;  Service: Thoracic;  Laterality: Left;   PACEMAKER IMPLANT N/A 03/30/2020   Procedure: PACEMAKER IMPLANT;  Surgeon: Waddell Danelle ORN, MD;  Location: [facility];  Service: Cardiovascular;  Laterality: N/A;   PERIPHERAL VASCULAR INTERVENTION Right 12/10/2020   Procedure: PERIPHERAL VASCULAR INTERVENTION;  Surgeon: Magda Debby SAILOR, MD;  Location: [facility];  Service: Cardiovascular;  Laterality: Right;  SFA   POSTERIOR FUSION CERVICAL  SPINE  2012   PPM GENERATOR REMOVAL N/A 12/14/2020   Procedure: PPM GENERATOR REMOVAL;  Surgeon: Waddell Danelle ORN, MD;  Location: [facility] LAB;  Service: Cardiovascular;  Laterality: N/A;   TEE WITHOUT CARDIOVERSION N/A 12/11/2020   Procedure: TRANSESOPHAGEAL ECHOCARDIOGRAM (TEE);  Surgeon: Barbaraann Darryle Debby, MD;  Location: [facility] ENDOSCOPY;  Service: Cardiovascular;  Laterality: N/A;   TRIGGER FINGER RELEASE  2011   4th finger left hand   VIDEO ASSISTED THORACOSCOPY Left 12/26/2016   Procedure: VIDEO ASSISTED THORACOSCOPY;  Surgeon: Elspeth JAYSON Millers, MD;  Location: [facility] OR;  Service: Thoracic;  Laterality: Left;   VIDEO BRONCHOSCOPY N/A 12/26/2016   Procedure: VIDEO BRONCHOSCOPY;  Surgeon: Elspeth JAYSON Millers, MD;  Location: [facility] OR;  Service: Thoracic;  Laterality: N/A;   Family History  Problem Relation Age of Onset   Diabetes Mellitus II Mother    Emphysema Father 68   Heart attack Father    Colon cancer Neg Hx    Esophageal cancer Neg Hx    Rectal cancer Neg Hx    Stomach cancer Neg Hx    Social History:  reports that he has never smoked. He has been exposed to tobacco smoke. He has never used smokeless tobacco. He reports that he does not drink alcohol and does not use drugs. Allergies: Allergies[1] Medications Prior to Admission  Medication Sig Dispense Refill   acetaminophen  (TYLENOL ) 500 MG tablet Take 1,000 mg by mouth every 8 (eight) hours as needed for mild pain (pain score 1-3) or moderate pain (pain score 4-6).     albuterol  (VENTOLIN  HFA) 108 (90 Base) MCG/ACT inhaler Inhale 2 puffs into the lungs every 4 (four) hours as needed for wheezing or shortness of breath. 6.7 g 0   apixaban  (ELIQUIS ) 5 MG TABS tablet Take 5 mg by mouth 2 (two) times daily.     Ascorbic Acid  (VITAMIN C ) 1000 MG tablet Take 1,000 mg by mouth daily.     atorvastatin  (LIPITOR ) 80 MG tablet TAKE 1 TABLET BY MOUTH EVERY DAY 90 tablet 3   diphenhydrAMINE  (BENADRYL ) 50 MG tablet Take 50 mg by mouth at  bedtime as needed for itching.     docusate sodium  (COLACE) 100 MG capsule Take 1 capsule (100 mg total) by mouth 2 (two) times daily. (Patient taking differently: Take 100 mg by mouth at bedtime.) 10 capsule 0   escitalopram  (LEXAPRO ) 10 MG tablet Take 1 tablet (10 mg total) by mouth daily. 30 tablet 0   furosemide  (LASIX ) 20 MG tablet Take 20 mg by mouth daily with lunch.     furosemide  (LASIX ) 40 MG tablet Take 1 tablet (40 mg total) by mouth 2 (two) times daily.     gabapentin  (NEURONTIN ) 100 MG capsule  Take 1 capsule (100 mg total) by mouth 3 (three) times daily. (Patient taking differently: Take 100 mg by mouth 3 (three) times daily as needed (Pain).) 90 capsule 0   HUMALOG  KWIKPEN 100 UNIT/ML KwikPen Inject 10-15 Units into the skin 3 (three) times daily. Can take 15-18 Units with meals while on Prednisone  for 3days (Patient taking differently: Inject 15-20 Units into the skin 3 (three) times daily with meals. Sliding scale)     HYDROcodone -acetaminophen  (NORCO/VICODIN) 5-325 MG tablet Take 1 tablet by mouth at bedtime as needed for moderate pain (pain score 4-6) or severe pain (pain score 7-10).     hydrOXYzine  (ATARAX ) 25 MG tablet Take 25 mg by mouth at bedtime.     ibuprofen (ADVIL) 200 MG tablet Take 400 mg by mouth every 6 (six) hours as needed for moderate pain (pain score 4-6) or mild pain (pain score 1-3).     lisinopril  (ZESTRIL ) 10 MG tablet Take 10 mg by mouth daily.     LORazepam  (ATIVAN ) 1 MG tablet Take 1 tablet (1 mg total) by mouth at bedtime as needed for sleep. 3 tablet 0   Magnesium  200 MG TABS Take 200 mg by mouth at bedtime.     methocarbamol  (ROBAXIN ) 500 MG tablet Take 1 tablet (500 mg total) by mouth every 8 (eight) hours as needed for muscle spasms. 30 tablet 0   Multiple Vitamin (MULTIVITAMIN WITH MINERALS) TABS tablet Take 1 tablet by mouth in the morning.     Multiple Vitamins-Minerals (ICAPS AREDS FORMULA PO) Take 2 tablets by mouth daily.     Nutritional  Supplements (FEEDING SUPPLEMENT, NEPRO CARB STEADY,) LIQD Take 237 mLs by mouth 2 (two) times daily between meals. (Patient taking differently: Take 237 mLs by mouth 2 (two) times daily between meals. Premier)     Olopatadine  HCl (PATADAY  OP) Place 2 drops into both eyes daily as needed (allergies).     OXYGEN Inhale 2 L into the lungs See admin instructions. Inhale 3L of Oxygen everynight at bedside. May uses throughout the day as needed.     pantoprazole  (PROTONIX ) 40 MG tablet Take 1 tablet (40 mg total) by mouth 2 (two) times daily. 60 tablet 0   polyethylene glycol powder (GLYCOLAX /MIRALAX ) 17 GM/SCOOP powder Take 17 g by mouth daily as needed for mild constipation or moderate constipation.     Probiotic Product (ALIGN) 10 MG CAPS Take 10 mg by mouth daily.     traZODone  (DESYREL ) 50 MG tablet Take 75 mg by mouth at bedtime.     TRESIBA  FLEXTOUCH 200 UNIT/ML FlexTouch Pen Inject 20 Units into the skin in the morning.     zinc  sulfate 220 (50 Zn) MG capsule Take 1 capsule (220 mg total) by mouth daily. 30 capsule 0   albuterol  (PROVENTIL ) (2.5 MG/3ML) 0.083% nebulizer solution Take 2.5 mg by nebulization 2 (two) times daily as needed for wheezing or shortness of breath.     EPINEPHrine  0.3 mg/0.3 mL IJ SOAJ injection Inject 0.3 mg into the muscle as needed for anaphylaxis. 1 each 1   mupirocin  ointment (BACTROBAN ) 2 % Apply 1 Application topically daily as needed (Rash).     triamcinolone cream (KENALOG) 0.1 % Apply 1 Application topically daily as needed (Rash).        Home: Home Living Family/patient expects to be discharged to:: Private residence Living Arrangements: Spouse/significant other, Other relatives (grandson) Available Help at Discharge: Family, Available 24 hours/day Type of Home: House Home Access: Ramped entrance Home Layout:  Two level, Able to live on main level with bedroom/bathroom Bathroom Shower/Tub: Health Visitor: Handicapped height Bathroom  Accessibility: Yes Home Equipment: Information systems manager, Agricultural Consultant (2 wheels), Art gallery manager, Wheelchair - manual, [employer] - single point, Other (comment), Grab bars - tub/shower, Grab bars - toilet, Adaptive equipment Adaptive Equipment: Reacher, Sock aid, Long-handled shoe horn, Long-handled sponge   Functional History: Prior Function Prior Level of Function : Independent/Modified Independent Mobility Comments: Uses a scooter in house and when out and about ADLs Comments: pt able to transfer to shower seat   Functional Status:  Mobility: Bed Mobility Overal bed mobility: Needs Assistance Bed Mobility: Supine to Sit, Sit to Supine, Rolling Rolling: Min assist Supine to sit: Mod assist, HOB elevated, Used rails Sit to supine: Mod assist, HOB elevated, Used rails General bed mobility comments: assistance with trunk and use of bed features Transfers General transfer comment: pretransfer training tolerated today. Pt assisted in donning RLE prosthesis while sitting EOB and worked on scooting along EOB to right. Ambulation/Gait General Gait Details: unable   ADL: ADL Overall ADL's : Needs assistance/impaired Grooming: Wash/dry hands, Wash/dry face, Oral care, Contact guard assist, Sitting Grooming Details (indicate cue type and reason): EOB General ADL Comments: Working on bed mobility,  sitting EOB balance today as precursor to basic ADLs, and transfers and following commands   Cognition: Cognition Orientation Level: Oriented X4 Cognition Arousal: Alert Behavior During Therapy: WFL for tasks assessed/performed  Physical Exam: There were no vitals taken for this visit. Gen: no distress, normal appearing HEENT: oral mucosa pink and moist, NCAT Cardio: Reg rate Chest: normal effort, normal rate of breathing Abd: soft, non-distended Ext: no edema Psych: pleasant, normal affect Skin:    Comments: Left BKA is dressed with wound VAC.  Right BKA well-healed.  Neurological:      Comments: Patient is alert and makes eye contact with examiner.  Appeared to be a bit distracted.  He was able to provide name.  He was able to provide year after multiple attempts.   Results for orders placed or performed during the hospital encounter of 12/11/24 (from the past 48 hours)  Glucose, capillary     Status: Abnormal   Collection Time: 12/23/24  5:13 PM  Result Value Ref Range   Glucose-Capillary 106 (H) 70 - 99 mg/dL    Comment: Glucose reference range applies only to samples taken after fasting for at least 8 hours.  Glucose, capillary     Status: Abnormal   Collection Time: 12/23/24  8:20 PM  Result Value Ref Range   Glucose-Capillary 132 (H) 70 - 99 mg/dL    Comment: Glucose reference range applies only to samples taken after fasting for at least 8 hours.  Glucose, capillary     Status: None   Collection Time: 12/24/24 12:23 AM  Result Value Ref Range   Glucose-Capillary 77 70 - 99 mg/dL    Comment: Glucose reference range applies only to samples taken after fasting for at least 8 hours.  Glucose, capillary     Status: None   Collection Time: 12/24/24  1:27 AM  Result Value Ref Range   Glucose-Capillary 91 70 - 99 mg/dL    Comment: Glucose reference range applies only to samples taken after fasting for at least 8 hours.  Glucose, capillary     Status: Abnormal   Collection Time: 12/24/24  4:40 AM  Result Value Ref Range   Glucose-Capillary 125 (H) 70 - 99 mg/dL    Comment: Glucose  reference range applies only to samples taken after fasting for at least 8 hours.  Renal function panel (daily at 0500)     Status: Abnormal   Collection Time: 12/24/24  4:48 AM  Result Value Ref Range   Sodium 136 135 - 145 mmol/L   Potassium 4.7 3.5 - 5.1 mmol/L   Chloride 95 (L) 98 - 111 mmol/L   CO2 30 22 - 32 mmol/L   Glucose, Bld 123 (H) 70 - 99 mg/dL    Comment: Glucose reference range applies only to samples taken after fasting for at least 8 hours.   BUN 77 (H) 8 - 23 mg/dL    Creatinine, Ser 7.68 (H) 0.61 - 1.24 mg/dL   Calcium  9.0 8.9 - 10.3 mg/dL   Phosphorus 4.3 2.5 - 4.6 mg/dL   Albumin  3.3 (L) 3.5 - 5.0 g/dL   GFR, Estimated 27 (L) >60 mL/min    Comment: (NOTE) Calculated using the CKD-EPI Creatinine Equation (2021)    Anion gap 11 5 - 15    Comment: Performed at [facility] Lab, [address] [place], [place] [address]  Magnesium      Status: None   Collection Time: 12/24/24  4:48 AM  Result Value Ref Range   Magnesium  2.2 1.7 - 2.4 mg/dL    Comment: Performed at [facility], [address] [place], [place] [address]  Glucose, capillary     Status: Abnormal   Collection Time: 12/24/24  8:01 AM  Result Value Ref Range   Glucose-Capillary 127 (H) 70 - 99 mg/dL    Comment: Glucose reference range applies only to samples taken after fasting for at least 8 hours.  Glucose, capillary     Status: Abnormal   Collection Time: 12/24/24 11:56 AM  Result Value Ref Range   Glucose-Capillary 63 (L) 70 - 99 mg/dL    Comment: Glucose reference range applies only to samples taken after fasting for at least 8 hours.  CBC with Differential/Platelet     Status: Abnormal   Collection Time: 12/24/24 11:57 AM  Result Value Ref Range   WBC 14.3 (H) 4.0 - 10.5 K/uL   RBC 3.13 (L) 4.22 - 5.81 MIL/uL   Hemoglobin 9.2 (L) 13.0 - 17.0 g/dL   HCT 71.6 (L) 60.9 - 47.9 %   MCV 90.4 80.0 - 100.0 fL   MCH 29.4 26.0 - 34.0 pg   MCHC 32.5 30.0 - 36.0 g/dL   RDW 85.2 88.4 - 84.4 %   Platelets 359 150 - 400 K/uL   nRBC 0.0 0.0 - 0.2 %   Neutrophils Relative % 75 %   Neutro Abs 10.8 (H) 1.7 - 7.7 K/uL   Lymphocytes Relative 10 %   Lymphs Abs 1.4 0.7 - 4.0 K/uL   Monocytes Relative 10 %   Monocytes Absolute 1.5 (H) 0.1 - 1.0 K/uL   Eosinophils Relative 4 %   Eosinophils Absolute 0.6 (H) 0.0 - 0.5 K/uL   Basophils Relative 0 %   Basophils Absolute 0.1 0.0 - 0.1 K/uL   Immature Granulocytes 1 %   Abs Immature Granulocytes 0.08 (H) 0.00 - 0.07 K/uL    Comment:  Performed at [facility] Lab, [address] [place], [place] [address]  Hepatic function panel     Status: Abnormal   Collection Time: 12/24/24 11:57 AM  Result Value Ref Range   Total Protein 7.1 6.5 - 8.1 g/dL   Albumin  3.2 (L) 3.5 - 5.0 g/dL  AST 48 (H) 15 - 41 U/L   ALT 34 0 - 44 U/L   Alkaline Phosphatase 136 (H) 38 - 126 U/L   Total Bilirubin 0.3 0.0 - 1.2 mg/dL   Bilirubin, Direct 0.1 0.0 - 0.2 mg/dL   Indirect Bilirubin 0.2 (L) 0.3 - 0.9 mg/dL    Comment: Performed at [facility] Lab, [address], [place], [place] [address]  Glucose, capillary     Status: Abnormal   Collection Time: 12/24/24 12:51 PM  Result Value Ref Range   Glucose-Capillary 172 (H) 70 - 99 mg/dL    Comment: Glucose reference range applies only to samples taken after fasting for at least 8 hours.  Glucose, capillary     Status: Abnormal   Collection Time: 12/24/24  3:39 PM  Result Value Ref Range   Glucose-Capillary 134 (H) 70 - 99 mg/dL    Comment: Glucose reference range applies only to samples taken after fasting for at least 8 hours.  Urinalysis, Routine w reflex microscopic -Urine, Clean Catch     Status: Abnormal   Collection Time: 12/24/24  6:57 PM  Result Value Ref Range   Color, Urine YELLOW YELLOW   APPearance HAZY (A) CLEAR   Specific Gravity, Urine 1.016 1.005 - 1.030   pH 5.0 5.0 - 8.0   Glucose, UA NEGATIVE NEGATIVE mg/dL   Hgb urine dipstick NEGATIVE NEGATIVE   Bilirubin Urine NEGATIVE NEGATIVE   Ketones, ur NEGATIVE NEGATIVE mg/dL   Protein, ur 899 (A) NEGATIVE mg/dL   Nitrite NEGATIVE NEGATIVE   Leukocytes,Ua NEGATIVE NEGATIVE   RBC / HPF 0-5 0 - 5 RBC/hpf   WBC, UA 6-10 0 - 5 WBC/hpf   Bacteria, UA RARE (A) NONE SEEN   Squamous Epithelial / HPF 0-5 0 - 5 /HPF   Mucus PRESENT     Comment: Performed at [facility], [address], [place], [place] [address]  Glucose, capillary     Status: Abnormal   Collection Time: 12/24/24  8:07 PM  Result Value Ref Range    Glucose-Capillary 161 (H) 70 - 99 mg/dL    Comment: Glucose reference range applies only to samples taken after fasting for at least 8 hours.  Glucose, capillary     Status: Abnormal   Collection Time: 12/25/24 12:16 AM  Result Value Ref Range   Glucose-Capillary 171 (H) 70 - 99 mg/dL    Comment: Glucose reference range applies only to samples taken after fasting for at least 8 hours.  Glucose, capillary     Status: Abnormal   Collection Time: 12/25/24  4:58 AM  Result Value Ref Range   Glucose-Capillary 127 (H) 70 - 99 mg/dL    Comment: Glucose reference range applies only to samples taken after fasting for at least 8 hours.  Renal function panel (daily at 0500)     Status: Abnormal   Collection Time: 12/25/24  5:52 AM  Result Value Ref Range   Sodium 133 (L) 135 - 145 mmol/L   Potassium 5.1 3.5 - 5.1 mmol/L   Chloride 94 (L) 98 - 111 mmol/L   CO2 29 22 - 32 mmol/L   Glucose, Bld 128 (H) 70 - 99 mg/dL    Comment: Glucose reference range applies only to samples taken after fasting for at least 8 hours.   BUN 99 (H) 8 - 23 mg/dL   Creatinine, Ser 7.26 (H) 0.61 - 1.24 mg/dL   Calcium  8.9 8.9 - 10.3 mg/dL   Phosphorus 4.7 (  H) 2.5 - 4.6 mg/dL   Albumin  3.3 (L) 3.5 - 5.0 g/dL   GFR, Estimated 22 (L) >60 mL/min    Comment: (NOTE) Calculated using the CKD-EPI Creatinine Equation (2021)    Anion gap 10 5 - 15    Comment: Performed at [facility], [address] [place], [place] [address]  Magnesium      Status: None   Collection Time: 12/25/24  5:52 AM  Result Value Ref Range   Magnesium  2.4 1.7 - 2.4 mg/dL    Comment: Performed at [facility], [address] [place], [place] [address]  CBC     Status: Abnormal   Collection Time: 12/25/24  5:52 AM  Result Value Ref Range   WBC 12.5 (H) 4.0 - 10.5 K/uL   RBC 2.95 (L) 4.22 - 5.81 MIL/uL   Hemoglobin 8.9 (L) 13.0 - 17.0 g/dL   HCT 72.8 (L) 60.9 - 47.9 %   MCV 91.9 80.0 - 100.0 fL   MCH 30.2 26.0 - 34.0 pg   MCHC 32.8  30.0 - 36.0 g/dL   RDW 85.1 88.4 - 84.4 %   Platelets 331 150 - 400 K/uL   nRBC 0.0 0.0 - 0.2 %    Comment: Performed at [facility], [address] [place], [place] [address]  Glucose, capillary     Status: Abnormal   Collection Time: 12/25/24  9:00 AM  Result Value Ref Range   Glucose-Capillary 115 (H) 70 - 99 mg/dL    Comment: Glucose reference range applies only to samples taken after fasting for at least 8 hours.  Glucose, capillary     Status: Abnormal   Collection Time: 12/25/24 12:37 PM  Result Value Ref Range   Glucose-Capillary 114 (H) 70 - 99 mg/dL    Comment: Glucose reference range applies only to samples taken after fasting for at least 8 hours.   *Note: Due to a large number of results and/or encounters for the requested time period, some results have not been displayed. A complete set of results can be found in Results Review.   CT ABDOMEN PELVIS WO CONTRAST Result Date: 12/25/2024 CLINICAL DATA:  Abdominal pain. EXAM: CT ABDOMEN AND PELVIS WITHOUT CONTRAST TECHNIQUE: Multidetector CT imaging of the abdomen and pelvis was performed following the standard protocol without IV contrast. RADIATION DOSE REDUCTION: This exam was performed according to the departmental dose-optimization program which includes automated exposure control, adjustment of the mA and/or kV according to patient size and/or use of iterative reconstruction technique. COMPARISON:  07/23/2024 of FINDINGS: Lower chest: Chronic interstitial lung disease noted in both lower lungs is similar to prior. The heart is enlarged. Status post TAVR. Hepatobiliary: No suspicious focal abnormality in the liver on this study without intravenous contrast. There is no evidence for gallstones, gallbladder wall thickening, or pericholecystic fluid. No intrahepatic or extrahepatic biliary dilation. Pancreas: No focal mass lesion. No dilatation of the main duct. No intraparenchymal cyst. No peripancreatic edema. Spleen: No  splenomegaly. No suspicious focal mass lesion. Adrenals/Urinary Tract: No adrenal nodule or mass. Kidneys unremarkable. No evidence for hydroureter. The urinary bladder appears normal for the degree of distention. Stomach/Bowel: Stomach is unremarkable. No gastric wall thickening. No evidence of outlet obstruction. Feeding tube tip is in the descending duodenum. No small bowel wall thickening. No small bowel dilatation. The terminal ileum is normal. The appendix is normal. No gross colonic mass. No colonic wall thickening. Rectal tube evident. Vascular/Lymphatic: There is moderate atherosclerotic calcification  of the abdominal aorta without aneurysm. There is no gastrohepatic or hepatoduodenal ligament lymphadenopathy. No retroperitoneal or mesenteric lymphadenopathy. No pelvic sidewall lymphadenopathy. Reproductive: The prostate gland and seminal vesicles are unremarkable. Other: No substantial intraperitoneal free fluid. Rim enhancing retroperitoneal collection seen on the previous study has decreased in the interval measuring 4.0 x 1.6 cm today compared to 5.3 x 2.7 cm previously. Features likely reflect resolving hematoma or residual granulation/scar from prior infection. Musculoskeletal: No worrisome lytic or sclerotic osseous abnormality. IMPRESSION: 1. No acute findings in the abdomen or pelvis. Specifically, no findings to explain the patient's history of abdominal pain. 2. Interval decrease in size of the right retroperitoneal collection seen on the previous study. Previous study performed with contrast shows some peripheral enhancement of this collection. Features likely reflect chronic resolving hematoma or residual granulation/scar from prior infection. 3. Chronic interstitial lung disease in both lower lungs, similar to prior. 4.  Aortic Atherosclerosis (ICD10-I70.0). Electronically Signed   By: Camellia Candle M.D.   On: 12/25/2024 05:36   DG Abd Portable 1V Result Date: 12/24/2024 CLINICAL DATA:   Nausea. EXAM: PORTABLE ABDOMEN - 1 VIEW COMPARISON:  12/13/2024 and 07/24/2024 FINDINGS: Enteric tube is present with tip right of midline in the mid abdomen likely over the distal duodenal C sweep. Bowel gas pattern is nonobstructive. No free peritoneal air. Significant degenerative changes of the spine and hips. IMPRESSION: 1. Nonobstructive bowel gas pattern. 2. Enteric tube with tip over the distal duodenal C sweep. Electronically Signed   By: Toribio Agreste M.D.   On: 12/24/2024 09:35      There were no vitals taken for this visit.  Medical Problem List and Plan: 1. 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  Antithrombotics: -DVT/anticoagulation:  Pharmaceutical: Eliquis   -antiplatelet therapy: N/A 3. Pain Management: Neurontin  100 mg nightly, oxycodone  as needed, Robaxin  as needed 4. Mood/Behavior/Sleep: Rozerem  8 mg nightly, trazodone  75 mg nightly as needed  -antipsychotic agents: N/A 5. Neuropsych/cognition: This patient is not capable of making decisions on his own behalf. 6. Skin/Wound Care: Routine skin checks 7. Fluids/Electrolytes/Nutrition: Routine and analysis with follow-up chemistries 8.  History of right BKA 2022.  Patient received CIR.  Intermittent use of prosthesis 9.  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  CRRT 1/10 - 1/12 transition to intermittent hemodialysis.  Follow-up renal services 10.  Paroxysmal atrial fibrillation.  Continue Eliquis .  Cardiac rate controlled 11.  Acute on chronic anemia.  Follow-up CBC.  Continue iron  supplement 12.  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  NovoLog  4 units every 4 hours, Lantus  insulin  18 units twice daily. 13.  Hypotension.  ProAmatine  5 mg every 8 hours.  Monitor with increased  mobility 14.  Chronic systolic congestive heart failure.  Echocardiogram with ejection fraction of 30 to 35%.  Monitor for any signs of fluid overload 15.  CAD/AV block/Mobitz 1/aortic stenosis/CABG 2021.  Follow-up cardiology services. 16.  Idiopathic pulmonary fibrosis/OSA.  Followed by Dr. Geronimo.  Patient with 2 L oxygen intermittently at home.  Currently not on CPAP until tube feeds discontinued  17.  Oropharyngeal dysphagia.  Follow-up speech therapy.  Currently with Cortrak feeding tube for nutritional support.  Advance diet as tolerated. Currently NPO with ice chips allowed  18.  Diarrhea.  Related to tube  feeds.  Flexi-Seal in place: continue Continue Imodium .  19.  GERD: continue Protonix   20.  Hyperlipidemia: continue Lipitor   I have personally performed a face to face diagnostic evaluation, including, but not limited to relevant history and physical exam findings, of this patient and developed relevant assessment and plan.  Additionally, I have reviewed and concur with the physician assistant's documentation above.  Sven Elks, MD  Sven SHAUNNA Elks, MD 12/25/2024      [1]  Allergies Allergen Reactions   Codeine Hives and Itching   Ofev  [Nintedanib ] Diarrhea    SEVERE DIARRHEA   Pirfenidone  Diarrhea and Other (See Comments)    Esbriet  (Pirfenidone ) causes elevated LFTs. D/C on 06/14/17 and SEVERE DIARRHEA    Amoxicillin Itching   "

## 2024-12-25 NOTE — Discharge Summary (Signed)
 Physician Discharge Summary  Emileo Semel Heminger FMW:[id number] DOB: 03-31-1941 DOA: 12/11/2024  PCP: Shepard Ade, MD  Admit date: 12/11/2024 Discharge date: 12/25/2024  Admitted From: Home Disposition: CIR  Discharge Condition:Stable CODE STATUS: Diet recommendation: Tube diet  Brief/Interim Summary: 84 year old male with past medical history significant for chronic kidney disease stage III, diabetes, hypertension, hyperlipidemia, IPF, OSA on CPAP and at bedtime 2L , and HFrEF with LVEF 30 to 35%. He presented to [facility] 1/7 after failing conservative outpatient therapy for ischemic lower extremity ulcers and left foot with gangrene. He underwent transtibial amputation and was subsequently sent to the medical floor for recovery. In the early a.m. hours 1/8 he became hypotensive which did improve temporarily with crystalloid bolus, however, he again became hypotensive with systolic blood pressures in the 70s and 80s. He was subsequently transferred to the ICU. He was stabilized. He was also noted to be in acute kidney injury. Nephrology was consulted. He underwent CRRT. Stabilized and then transferred to the floor.  PT/OT  recommending CIR on discharge.  Patient has a bed at CIR today  Following problems were addressed during the hospitalization:  Left foot gangrene/septic shock Secondary to left foot gangrene.  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 Patient was in the ICU.  Required pressors.  Patient was stabilized. Patient was started on midodrine .  Blood pressures have improved with occasional high readings.  Midodrine  dose was decreased to 5 mg every 8 hours.  Blood pressure holding steady.  Maintain current dose for now. He has completed course of Zosyn .  He was on linezolid  previously as well.    Acute kidney injury on chronic kidney disease stage IIIb Patient had worsening in his renal function.  Nephrology was consulted.  Patient was started on CRRT.  Now on  intermittent hemodialysis.  Urine output has picked up in the last few days. Further management per nephrology. Foley catheter was discontinued and he has been able to void via external catheter.  Bladder scans as needed.   Nausea Reason for his nausea is not entirely clear.  Continue antiemetics as needed.  CT abdomen/pelvis did not show any acute findings,decreased right retroperitoneal collection   Acute metabolic encephalopathy/hospital delirium Remained encephalopathic though appears to show some improvement.  Continue to engage with patient on a frequent basis.  No focal neurological deficits noted.   Continue with Rozerem .  Haldol  as needed.   Mentation is gradually improving.  Episodes of agitation has decreased in frequency.   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  Continue current insulin  regimen HbA1c 7.6.   Chronic systolic CHF EF is 30 to 35% based on echocardiogram done on 12/12/2024. Cardiac status stable for the most part.  Volume being managed with hemodialysis.   Paroxysmal atrial fibrillation Eliquis  being continued.   Currently not on any rate limiting drugs due to his recent septic shock.  He is requiring midodrine  for blood pressure support.   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  Underwent left transtibial amputation during this hospitalization.  Orthopedics is following periodically.  Has a wound VAC but he does not need it at discharge. Tylenol  on a scheduled basis for pain control.  Avoid oxycodone  as much as possible.Follow up with Dr Harden   Normocytic anemia Likely anemia of chronic disease.  No evidence of overt bleeding.  Hemoglobin remains stable.     Oropharyngeal dysphagia/moderate protein calorie malnutrition Has a  cortrak feeding tube.  Speech therapy is following.  Hopefully as his mentation improves we will be able to start him on oral diet.  Underwent  modified barium swallow 1/15 and unfortunately diet cannot be advanced quite yet.   Patient and wife mentions that at baseline he has always had some degree of difficulty swallowing which he attributes to his cervical spine issues. SLP to reassess at CIR   Diarrhea Has had profuse stool output presumably from his tube feedings.  He was given Imodium .  Flexi-Seal had to be inserted.  Still with significant output.  Continue Flexi-Seal for now.  Imodium  as needed.   History of sleep apnea Resume CPAP once feeding tube has been discontinued.     Discharge Diagnoses:  Principal Problem:   Ischemic foot Active Problems:   Malnutrition of moderate degree   S/P BKA (below knee amputation), left (HCC)   Encephalopathy acute   Hyperglycemia    Discharge Instructions  Discharge Instructions     Diet general   Complete by: As directed    Tube diet   Discharge instructions   Complete by: As directed    1)Take your medications as instructed 2)Follow up with speech therapy to evaluate for swallowing ability   Increase activity slowly   Complete by: As directed    No wound care   Complete by: As directed       Allergies as of 12/25/2024       Reactions   Codeine Hives, Itching   Ofev  [nintedanib ] Diarrhea   SEVERE DIARRHEA   Pirfenidone  Diarrhea, Other (See Comments)   Esbriet  (Pirfenidone ) causes elevated LFTs. D/C on 06/14/17 and SEVERE DIARRHEA   Amoxicillin Itching        Medication List     STOP taking these medications    acetaminophen  500 MG tablet Commonly known as: TYLENOL  Replaced by: acetaminophen  160 MG/5ML solution   diphenhydrAMINE  50 MG tablet Commonly known as: BENADRYL    docusate sodium  100 MG capsule Commonly known as: COLACE   feeding supplement (NEPRO CARB STEADY) Liqd Replaced by: feeding supplement (KATE FARMS STANDARD ENT 1.4) Liqd liquid   furosemide  20 MG tablet Commonly known as: LASIX    furosemide  40 MG tablet Commonly known as:  Lasix    gabapentin  100 MG capsule Commonly known as: NEURONTIN  Replaced by: gabapentin  250 MG/5ML solution   HumaLOG  KwikPen 100 UNIT/ML KwikPen Generic drug: insulin  lispro   HYDROcodone -acetaminophen  5-325 MG tablet Commonly known as: NORCO/VICODIN   ibuprofen 200 MG tablet Commonly known as: ADVIL   ICAPS AREDS FORMULA PO   lisinopril  10 MG tablet Commonly known as: ZESTRIL    LORazepam  1 MG tablet Commonly known as: ATIVAN    Magnesium  200 MG Tabs   mupirocin  ointment 2 % Commonly known as: BACTROBAN    pantoprazole  40 MG tablet Commonly known as: PROTONIX  Replaced by: pantoprazole  40 MG injection   Tresiba  FlexTouch 200 UNIT/ML FlexTouch Pen Generic drug: insulin  degludec       TAKE these medications    acetaminophen  160 MG/5ML solution Commonly known as: TYLENOL  Place 15.6 mLs (500 mg total) into feeding tube 3 (three) times daily. Replaces: acetaminophen  500 MG tablet   albuterol  (2.5 MG/3ML) 0.083% nebulizer solution Commonly known as: PROVENTIL  Take 2.5 mg by nebulization 2 (two) times daily as needed for wheezing or shortness of breath.   albuterol  108 (90 Base) MCG/ACT inhaler Commonly known as: VENTOLIN  HFA Inhale 2 puffs into the lungs every 4 (four) hours as needed for wheezing or shortness of breath.  Align 10 MG Caps Take 10 mg by mouth daily.   apixaban  2.5 MG Tabs tablet Commonly known as: ELIQUIS  Place 1 tablet (2.5 mg total) into feeding tube 2 (two) times daily. What changed:  medication strength how much to take how to take this   ascorbic acid  1000 MG tablet Commonly known as: VITAMIN C  Place 1 tablet (1,000 mg total) into feeding tube daily. Start taking on: December 26, 2024 What changed: how to take this   atorvastatin  80 MG tablet Commonly known as: LIPITOR  Place 1 tablet (80 mg total) into feeding tube daily. Start taking on: December 26, 2024 What changed: how to take this   dextromethorphan  30 MG/5ML  liquid Commonly known as: DELSYM  Place 2.5 mLs (15 mg total) into feeding tube daily as needed for cough. What changed:  how to take this when to take this reasons to take this   EPINEPHrine  0.3 mg/0.3 mL Soaj injection Commonly known as: EPI-PEN Inject 0.3 mg into the muscle as needed for anaphylaxis.   escitalopram  10 MG tablet Commonly known as: LEXAPRO  Take 1 tablet (10 mg total) by mouth daily.   feeding supplement (KATE FARMS STANDARD ENT 1.4) Liqd liquid Place 1,000 mLs into feeding tube continuous. Replaces: feeding supplement (NEPRO CARB STEADY) Liqd   nutrition supplement (JUVEN) Pack Place 1 packet into feeding tube 2 (two) times daily between meals.   feeding supplement (PROSource TF20) liquid Place 60 mLs into feeding tube 2 (two) times daily.   ferrous sulfate  300 (60 Fe) MG/5ML syrup Place 5 mLs (300 mg total) into feeding tube daily with breakfast. Start taking on: December 26, 2024   gabapentin  250 MG/5ML solution Commonly known as: NEURONTIN  Place 2 mLs (100 mg total) into feeding tube at bedtime. Replaces: gabapentin  100 MG capsule   hydrOXYzine  25 MG tablet Commonly known as: ATARAX  Place 1 tablet (25 mg total) into feeding tube 3 (three) times daily as needed for anxiety. What changed:  how to take this when to take this reasons to take this   insulin  aspart 100 UNIT/ML injection Commonly known as: novoLOG  Inject 4 Units into the skin every 4 (four) hours.   insulin  aspart 100 UNIT/ML injection Commonly known as: novoLOG  Inject 0-15 Units into the skin every 4 (four) hours.   insulin  glargine 100 UNIT/ML injection Commonly known as: LANTUS  Inject 0.18 mLs (18 Units total) into the skin 2 (two) times daily.   loperamide  HCl 1 MG/7.5ML suspension Commonly known as: IMODIUM  Place 30 mLs (4 mg total) into feeding tube 3 (three) times daily as needed for diarrhea or loose stools.   methocarbamol  500 MG tablet Commonly known as: ROBAXIN  Place  1 tablet (500 mg total) into feeding tube every 8 (eight) hours as needed for muscle spasms. What changed: how to take this   midodrine  5 MG tablet Commonly known as: PROAMATINE  Place 1 tablet (5 mg total) into feeding tube every 8 (eight) hours.   multivitamin with minerals Tabs tablet Place 1 tablet into feeding tube in the morning. Start taking on: December 26, 2024 What changed: how to take this   naphazoline-glycerin  Soln Commonly known as: CLEAR EYES REDNESS Place 1-2 drops into both eyes 4 (four) times daily as needed for eye irritation.   oxyCODONE  5 MG immediate release tablet Commonly known as: Oxy IR/ROXICODONE  Place 0.5 tablets (2.5 mg total) into feeding tube every 4 (four) hours as needed for severe pain (pain score 7-10).   OXYGEN Inhale 2 L into the lungs  See admin instructions. Inhale 3L of Oxygen everynight at bedside. May uses throughout the day as needed.   pantoprazole  40 MG injection Commonly known as: PROTONIX  Inject 40 mg into the vein 2 (two) times daily. Replaces: pantoprazole  40 MG tablet   PATADAY  OP Place 2 drops into both eyes daily as needed (allergies).   polyethylene glycol powder 17 GM/SCOOP powder Commonly known as: GLYCOLAX /MIRALAX  Take 17 g by mouth daily as needed for mild constipation or moderate constipation.   ramelteon  8 MG tablet Commonly known as: ROZEREM  Take 1 tablet (8 mg total) by mouth at bedtime.   tamsulosin  0.4 MG Caps capsule Commonly known as: FLOMAX  Take 1 capsule (0.4 mg total) by mouth daily after supper.   traZODone  150 MG tablet Commonly known as: DESYREL  Place 0.5 tablets (75 mg total) into feeding tube at bedtime as needed for sleep. What changed:  medication strength how to take this when to take this reasons to take this   triamcinolone cream 0.1 % Commonly known as: KENALOG Apply 1 Application topically daily as needed (Rash).   zinc  sulfate (50mg  elemental zinc ) 220 (50 Zn) MG capsule Take 1  capsule (220 mg total) by mouth daily.        Follow-up Information     Harden Jerona GAILS, MD Follow up in 1 week(s).   Specialty: Orthopedic Surgery Contact information: [address] [place]  [place] [address] [phone]                Allergies[1]  Consultations: PCCM, orthopedics, nephrology   Procedures/Studies: CT ABDOMEN PELVIS WO CONTRAST Result Date: 12/25/2024 CLINICAL DATA:  Abdominal pain. EXAM: CT ABDOMEN AND PELVIS WITHOUT CONTRAST TECHNIQUE: Multidetector CT imaging of the abdomen and pelvis was performed following the standard protocol without IV contrast. RADIATION DOSE REDUCTION: This exam was performed according to the departmental dose-optimization program which includes automated exposure control, adjustment of the mA and/or kV according to patient size and/or use of iterative reconstruction technique. COMPARISON:  07/23/2024 of FINDINGS: Lower chest: Chronic interstitial lung disease noted in both lower lungs is similar to prior. The heart is enlarged. Status post TAVR. Hepatobiliary: No suspicious focal abnormality in the liver on this study without intravenous contrast. There is no evidence for gallstones, gallbladder wall thickening, or pericholecystic fluid. No intrahepatic or extrahepatic biliary dilation. Pancreas: No focal mass lesion. No dilatation of the main duct. No intraparenchymal cyst. No peripancreatic edema. Spleen: No splenomegaly. No suspicious focal mass lesion. Adrenals/Urinary Tract: No adrenal nodule or mass. Kidneys unremarkable. No evidence for hydroureter. The urinary bladder appears normal for the degree of distention. Stomach/Bowel: Stomach is unremarkable. No gastric wall thickening. No evidence of outlet obstruction. Feeding tube tip is in the descending duodenum. No small bowel wall thickening. No small bowel dilatation. The terminal ileum is normal. The appendix is normal. No gross colonic mass. No colonic wall thickening. Rectal tube  evident. Vascular/Lymphatic: There is moderate atherosclerotic calcification of the abdominal aorta without aneurysm. There is no gastrohepatic or hepatoduodenal ligament lymphadenopathy. No retroperitoneal or mesenteric lymphadenopathy. No pelvic sidewall lymphadenopathy. Reproductive: The prostate gland and seminal vesicles are unremarkable. Other: No substantial intraperitoneal free fluid. Rim enhancing retroperitoneal collection seen on the previous study has decreased in the interval measuring 4.0 x 1.6 cm today compared to 5.3 x 2.7 cm previously. Features likely reflect resolving hematoma or residual granulation/scar from prior infection. Musculoskeletal: No worrisome lytic or sclerotic osseous abnormality. IMPRESSION: 1. No acute findings in the abdomen or pelvis. Specifically, no findings to  explain the patient's history of abdominal pain. 2. Interval decrease in size of the right retroperitoneal collection seen on the previous study. Previous study performed with contrast shows some peripheral enhancement of this collection. Features likely reflect chronic resolving hematoma or residual granulation/scar from prior infection. 3. Chronic interstitial lung disease in both lower lungs, similar to prior. 4.  Aortic Atherosclerosis (ICD10-I70.0). Electronically Signed   By: Camellia Candle M.D.   On: 12/25/2024 05:36   DG Abd Portable 1V Result Date: 12/24/2024 CLINICAL DATA:  Nausea. EXAM: PORTABLE ABDOMEN - 1 VIEW COMPARISON:  12/13/2024 and 07/24/2024 FINDINGS: Enteric tube is present with tip right of midline in the mid abdomen likely over the distal duodenal C sweep. Bowel gas pattern is nonobstructive. No free peritoneal air. Significant degenerative changes of the spine and hips. IMPRESSION: 1. Nonobstructive bowel gas pattern. 2. Enteric tube with tip over the distal duodenal C sweep. Electronically Signed   By: Toribio Agreste M.D.   On: 12/24/2024 09:35   DG CHEST PORT 1 VIEW Result Date:  12/21/2024 EXAM: 1 VIEW(S) XRAY OF THE CHEST 12/21/2024 09:43:39 AM COMPARISON: 12/14/2024 CLINICAL HISTORY: Dyspnea FINDINGS: LINES, TUBES AND DEVICES: Enteric tube in place with tip below diaphragm and outside field of view. Right internal jugular central venous catheter in place with tip in mid superior vena cava. Implanted loop recorder noted over left chest. LUNGS AND PLEURA: Mild pulmonary edema. Diffuse left lung patchy opacities with mild improvement in aeration of right lung base. Persistent diffuse interstitial opacities. Small left pleural effusion. No pneumothorax. HEART AND MEDIASTINUM: TAVR noted. Cardiomegaly, unchanged. CABG markers noted. BONES AND SOFT TISSUES: Sternotomy wires noted. Partially imaged cervical fusion hardware. IMPRESSION: 1. Mild pulmonary edema and small left pleural effusion. 2. Diffuse left lung patchy opacities with persistent diffuse interstitial opacities, with mild improvement in aeration of the right lung base. 3. Stable support apparatus Electronically signed by: Norleen Boxer MD 12/21/2024 10:07 AM EST RP Workstation: [id number]   DG Swallowing Func-Speech Pathology Result Date: 12/19/2024 Table formatting from the original result was not included. Modified Barium Swallow Study Patient Details Name: Tyeler Goedken MRN: [id number] Date of Birth: 10/22/1941 Today's Date: 12/19/2024 HPI/PMH: HPI: 84 yo male admitted 1/7 for L transtibial amputation. Transferred to ICU 1/8 for hypotension. CRRT started 1/10. Intermittent dialysis started 1/14. Seen by SLP 12/2020 and 12/2023 with concern for primary esophageal dysphagia, complaining of difficulty with solids>liquids and episodes of regurgitation. Declined an MBS and although recommended, esophageal assessment was not been completed. PMH: CKD III, DM, HTN, HLD, IPF, OSA on CPAP, HFrEF with LVEF 30-35% Clinical Impression: Clinical Impression: Pt has an oropharyngeal dysphagia that is likely exacerbated by mentation. He is not  yet ready for a PO diet. Would continue using temporary, alternative means of nutrition with ongoing time to rebuild strength and allow for mentation to improve. Pt is more lethargic this afternoon compared to this morning, still waking up for PO trials though. He has minimal oral residue that he typically clears with a second swallow. Pharyngeally he has reduced base of tongue retraction and pharyngeal squeeze, contributing to incomplete epiglottic inversion (almost no inversion with smaller, lighter boluses) and vallecular residue. Presence of cervical hardware may also contribute from a structural standpoint. Pt has incomplete laryngeal vestibule closure and deep penetration to the vocal folds occurs with thin liquids, as well as more shallow penetration with thicker liquids. Penetration also occurs after the swallow with thicker liquids from residuals, also likely to have mixed with residue  from solid trials. Across the course of the study pt inconsistently coughs and clears his throat but can never clear the penetrates, and they ultimately spill below his vocal folds to become aspiration. Factors that may increase risk of adverse event in presence of aspiration [name]: Factors that may increase risk of adverse event in presence of aspiration [name]: Respiratory or GI disease; Reduced cognitive function; Limited mobility; Frail or deconditioned; Weak cough; Aspiration of thick, dense, and/or acidic materials Recommendations/Plan: Swallowing Evaluation Recommendations Swallowing Evaluation Recommendations Recommendations: NPO; Alternative means of nutrition - NG Tube; Ice chips PRN after oral care (limited amounts of ice chips after oral care) Medication Administration: Via alternative means Oral care recommendations: Oral care QID (4x/day); Oral care before ice chips/water Caregiver Recommendations: Have oral suction available Treatment Plan Treatment Plan Treatment  recommendations: Therapy as outlined in treatment plan below Follow-up recommendations: Acute inpatient rehab (3 hours/day) Functional status assessment: Patient has had a recent decline in their functional status and demonstrates the ability to make significant improvements in function in a reasonable and predictable amount of time. Treatment frequency: Min 2x/week Treatment duration: 2 weeks Interventions: Aspiration precaution training; Patient/family education; Trials of upgraded texture/liquids; Respiratory muscle strength training; Oropharyngeal exercises; Compensatory techniques Recommendations Recommendations for follow up therapy are one component of a multi-disciplinary discharge planning process, led by the attending physician.  Recommendations may be updated based on patient status, additional functional criteria and insurance authorization. Assessment: Orofacial Exam: Orofacial Exam Oral Cavity - Dentition: Adequate natural dentition; Missing dentition Anatomy: Anatomy: Presence of cervical hardware Boluses Administered: Boluses Administered Boluses Administered: Thin liquids (Level 0); Mildly thick liquids (Level 2, nectar thick); Moderately thick liquids (Level 3, honey thick); Puree; Solid  Oral Impairment Domain: Oral Impairment Domain Lip Closure: -- (not well visualized on imaging but no overt loss noted clinically) Tongue control during bolus hold: Posterior escape of greater than half of bolus Bolus preparation/mastication: Timely and efficient chewing and mashing Bolus transport/lingual motion: Brisk tongue motion Oral residue: Residue collection on oral structures Location of oral residue : Tongue Initiation of pharyngeal swallow : Valleculae  Pharyngeal Impairment Domain: Pharyngeal Impairment Domain Soft palate elevation: No bolus between soft palate (SP)/pharyngeal wall (PW) Laryngeal elevation: Complete superior movement of thyroid cartilage with complete approximation of arytenoids to  epiglottic petiole Anterior hyoid excursion: Complete anterior movement Epiglottic movement: No inversion Laryngeal vestibule closure: Incomplete, narrow column air/contrast in laryngeal vestibule Pharyngeal stripping wave : Present - diminished Pharyngeal contraction (A/P view only): N/A Pharyngoesophageal segment opening: Partial distention/partial duration, partial obstruction of flow Tongue base retraction: Narrow column of contrast or air between tongue base and PPW Pharyngeal residue: Collection of residue within or on pharyngeal structures Location of pharyngeal residue: Valleculae  Esophageal Impairment Domain: No data recorded Pill: No data recorded Penetration/Aspiration Scale Score: Penetration/Aspiration Scale Score 8.  Material enters airway, passes BELOW cords without attempt by patient to eject out (silent aspiration) : Thin liquids (Level 0); Mildly thick liquids (Level 2, nectar thick); Moderately thick liquids (Level 3, honey thick) Compensatory Strategies: No data recorded  General Information: Caregiver present: No  Diet Prior to this Study: NPO; Cortrak/Small bore NG tube   Temperature : Normal   Respiratory Status: WFL   Supplemental O2: Nasal cannula   History of Recent Intubation: No  Behavior/Cognition: Lethargic/Drowsy; Requires cueing Self-Feeding Abilities: Needs assist with self-feeding Baseline vocal quality/speech: Normal Volitional Cough: Able to elicit Volitional Swallow: Able to elicit Exam Limitations: Fatigue Goal Planning:  Prognosis for improved oropharyngeal function: Good Barriers to Reach Goals: Cognitive deficits No data recorded Patient/Family Stated Goal: none stated Consulted and agree with results and recommendations: Patient Pain: Pain Assessment Pain Assessment: Faces Faces Pain Scale: 0 Pain Intervention(s): Limited activity within patient's tolerance; Monitored during session End of Session: Start Time:SLP Start Time (ACUTE ONLY): 1518 Stop Time: SLP Stop Time (ACUTE  ONLY): 1537 Time Calculation:SLP Time Calculation (min) (ACUTE ONLY): 19 min Charges: SLP Evaluations $ SLP Speech Visit: 1 Visit SLP Evaluations $MBS Swallow: 1 Procedure $Swallowing Treatment: 1 Procedure SLP visit diagnosis: SLP Visit Diagnosis: Dysphagia, oropharyngeal phase (R13.12) Past Medical History: Past Medical History: Diagnosis Date  Acute blood loss anemia   Anxiety   AV block, Mobitz 1   Cataract   Chronic kidney disease   d/t DM  CKD (chronic kidney disease), stage III (HCC)   COVID-19 virus infection 07/18/2020  Last Assessment & Plan:   Formatting of this note might be different from the original.  Immunocompromise high risk patient  -ID consulted for further therapies and will administer regeneron as OP tomorrow   -Decadron  discontinued as patient is off O2    Depression   Diabetes mellitus   Vgo disposal insulin  bolus  simular to insulin  pump  Dyspnea   GERD (gastroesophageal reflux disease)   History of kidney stones   passed  Hyperlipidemia   Hypertension   Idiopathic pulmonary fibrosis (HCC) 11/2016  ILD (interstitial lung disease) (HCC)   Moderate aortic stenosis   a. 10/2019 Echo: EF 55-60%, Gr2 DD. Nl RV.   Neuromuscular disorder (HCC)   Neuropathy associated with endocrine disorder   Nonobstructive CAD (coronary artery disease)   a. 2012 Cath: mod, nonobs dzs; b. 10/2016 MV: EF 60%, no ischemia.  OSA on CPAP 05/05/2017  Unattended Home Sleep Test 7/2/813-AHI 38.6/hour, desaturation to 64%, body weight 261 pounds  PONV (postoperative nausea and vomiting)   Postoperative anemia due to acute blood loss 11/07/2020  Postoperative hemorrhagic shock 03/20/2021  Sleep apnea    uses cpap asked to bring mask and tubing Past Surgical History: Past Surgical History: Procedure Laterality Date  ABDOMINAL AORTOGRAM W/LOWER EXTREMITY N/A 12/10/2020  Procedure: ABDOMINAL AORTOGRAM W/LOWER EXTREMITY;  Surgeon: Magda Debby SAILOR, MD;  [facility]: [facility];  Service: Cardiovascular;  Laterality: N/A;   AMPUTATION Right 01/22/2021  Procedure: 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;  Surgeon: Harden Jerona GAILS, MD;  Location: [facility] OR;  Service: Orthopedics;  Laterality: Right;  AMPUTATION Right 03/17/2021  Procedure: RIGHT BELOW KNEE AMPUTATION;  Surgeon: Harden Jerona GAILS, MD;  Location: [facility] OR;  Service: Orthopedics;  Laterality: Right;  AMPUTATION Left 12/11/2024  Procedure: AMPUTATION BELOW KNEE;  Surgeon: Harden Jerona GAILS, MD;  Location: [facility] OR;  Service: Orthopedics;  Laterality: Left;  ANKLE FUSION Right 01/22/2021  Procedure: RIGHT FOOT TIBIOCALCANEAL FUSION;  Surgeon: Harden Jerona GAILS, MD;  Location: [facility] OR;  Service: Orthopedics;  Laterality: Right;  ANTERIOR FUSION CERVICAL SPINE  2012  APPLICATION OF WOUND VAC Left 12/11/2024  Procedure: APPLICATION, WOUND VAC;  Surgeon: Harden Jerona GAILS, MD;  Location: [facility] OR;  Service: Orthopedics;  Laterality: Left;  CARDIAC CATHETERIZATION  2011  CARDIAC CATHETERIZATION N/A 11/09/2016  Procedure: Right Heart Cath;  Surgeon: Victory LELON Sharps, MD;  Location: [facility] INVASIVE CV LAB;  Service: Cardiovascular;  Laterality: N/A;  carpel tunnel    left wrist  CATARACT EXTRACTION    CATARACT EXTRACTION W/ INTRAOCULAR LENS  IMPLANT, BILATERAL  2013  CERVICAL LAMINECTOMY  2012  COLONOSCOPY N/A  01/14/2013  Procedure: COLONOSCOPY;  Surgeon: Norleen LOISE Kiang, MD;  Location: [facility];  Service: Endoscopy;  Laterality: N/A;  CORONARY ARTERY BYPASS GRAFT  11/04/2020  LIMA-LAD, SVG-OM1, SVG-PDA (Dr Norleen Exon [facility]) dc 11/18/2020  EYE SURGERY    I & D EXTREMITY Right 02/19/2021  Procedure: RIGHT ANKLE DEBRIDEMENT AND PLACEMENT ANTIBIOTIC BEADS;  Surgeon: Harden Jerona GAILS, MD;  Location: [facility] OR;  Service: Orthopedics;  Laterality: Right;  KNEE SURGERY  1998  left  LEFT HEART CATH AND CORONARY ANGIOGRAPHY N/A 07/10/2020  Procedure: LEFT HEART CATH AND CORONARY ANGIOGRAPHY;  Surgeon: Wonda Sharper, MD;  Location: [facility] INVASIVE CV LAB;  Service: Cardiovascular;  Laterality: N/A;  LUMBAR LAMINECTOMY  2003  LUNG BIOPSY Left 12/26/2016   Procedure: LUNG BIOPSY;  Surgeon: Elspeth JAYSON Millers, MD;  Location: [facility] OR;  Service: Thoracic;  Laterality: Left;  PACEMAKER IMPLANT N/A 03/30/2020  Procedure: PACEMAKER IMPLANT;  Surgeon: Waddell Danelle ORN, MD;  Location: [facility];  Service: Cardiovascular;  Laterality: N/A;  PERIPHERAL VASCULAR INTERVENTION Right 12/10/2020  Procedure: PERIPHERAL VASCULAR INTERVENTION;  Surgeon: Magda Debby LOISE, MD;  Location: [facility];  Service: Cardiovascular;  Laterality: Right;  SFA  POSTERIOR FUSION CERVICAL SPINE  2012  PPM GENERATOR REMOVAL N/A 12/14/2020  Procedure: PPM GENERATOR REMOVAL;  Surgeon: Waddell Danelle ORN, MD;  Location: [facility];  Service: Cardiovascular;  Laterality: N/A;  TEE WITHOUT CARDIOVERSION N/A 12/11/2020  Procedure: TRANSESOPHAGEAL ECHOCARDIOGRAM (TEE);  Surgeon: Barbaraann Darryle Debby, MD;  Location: [facility] ENDOSCOPY;  Service: Cardiovascular;  Laterality: N/A;  TRIGGER FINGER RELEASE  2011  4th finger left hand  VIDEO ASSISTED THORACOSCOPY Left 12/26/2016  Procedure: VIDEO ASSISTED THORACOSCOPY;  Surgeon: Elspeth JAYSON Millers, MD;  Location: [facility] OR;  Service: Thoracic;  Laterality: Left;  VIDEO BRONCHOSCOPY N/A 12/26/2016  Procedure: VIDEO BRONCHOSCOPY;  Surgeon: Elspeth JAYSON Millers, MD;  Location: [facility] OR;  Service: Thoracic;  Laterality: N/A; [name] LOISE., M.A. CCC-SLP Acute Rehabilitation Services Office: [phone] Secure chat preferred 12/19/2024, 4:26 PM  US  RENAL Result Date: 12/15/2024 EXAM: RETROPERITONEAL ULTRASOUND OF THE KIDNEYS 12/15/2024 09:08:00 AM TECHNIQUE: Real-time ultrasonography of the retroperitoneum, specifically the kidneys and urinary bladder, was performed. COMPARISON: US  Renal 04/09/2024; 12/06/2023. CLINICAL HISTORY: Acute renal failure superimposed on stage 3 chronic kidney disease, unspecified acute renal failure type, unspecified whether stage 3a or 3b CKD (HCC). FINDINGS: RIGHT KIDNEY: Right kidney measures 12.1 x 5.2 x 5.5 cm with volume 184 ml.  Normal cortical echogenicity. No hydronephrosis. No calculus. No mass. LEFT KIDNEY: Left kidney measures 12.6 x 4.8 x 4.7 cm with volume 149 ml. Normal cortical echogenicity. No hydronephrosis. No calculus. No mass. BLADDER: Unremarkable appearance of the bladder. PLEURAL SPACES: Small right pleural effusion. IMPRESSION: 1. Normal sized kidneys without hydronephrosis. Electronically signed by: Toribio Agreste MD MD 12/15/2024 01:10 PM EST RP Workstation: [id number]   DG CHEST PORT 1 VIEW Result Date: 12/14/2024 CLINICAL DATA:  Central line placement. EXAM: PORTABLE CHEST 1 VIEW COMPARISON:  December 14, 2024 (9:28 a.m.) FINDINGS: Since the prior study there is been interval right internal jugular venous catheter placement with its distal tip overlying the distal portion of the superior vena cava. Stable enteric tube positioning is noted. Multiple sternal wires are present. The cardiac silhouette is mildly enlarged and unchanged in size. An artificial aortic valve is seen. Stable, diffuse, chronic appearing increased interstitial lung markings are noted. A small left pleural effusion is suspected. No pneumothorax is identified. Postoperative changes are seen within the cervical spine with  multilevel degenerative changes present throughout the thoracic spine. IMPRESSION: 1. Interval right internal jugular venous catheter placement without evidence of pneumothorax. 2. Stable cardiomegaly with chronic appearing increased interstitial lung markings. 3. Small left pleural effusion. Electronically Signed   By: Suzen Dials M.D.   On: 12/14/2024 12:51   DG CHEST PORT 1 VIEW Result Date: 12/14/2024 EXAM: 1 VIEW(S) XRAY OF THE CHEST 12/14/2024 09:31:00 AM COMPARISON: 12/12/2024 and CT chest, abdomen, and pelvis 07/23/2024. CLINICAL HISTORY: 83 year old male with respiratory failure FINDINGS: LINES, TUBES AND DEVICES: Enteric tube in place, coursing below left hemidiaphragm, beyond field of view. LUNGS AND PLEURA: Low  lung volumes. Chronic pulmonary fibrosis, and stable ventilation. No pneumothorax. HEART AND MEDIASTINUM: Stable cardiomegaly. Post aortic valve replacement. BONES AND SOFT TISSUES: Sternotomy wires noted. Partially imaged cervical spine fusion hardware. No acute osseous abnormality. IMPRESSION: 1. Chronic pulmonary fibrosis. Stable ventilation. 2. Enteric tube in place, coursing into the abdomen. Electronically signed by: Helayne Hurst MD MD 12/14/2024 09:37 AM EST RP Workstation: [id number]   DG Abd Portable 1V Result Date: 12/13/2024 EXAM: 1 VIEW XRAY OF THE ABDOMEN 12/13/2024 02:36:00 PM COMPARISON: 07/24/2024 CLINICAL HISTORY: Encounter for feeding tube placement [id number] FINDINGS: LINES, TUBES AND DEVICES: Enteric tube in place with tip overlying the expected region of the gastric antrum. TAVR noted. Lower CERVICAL SPINE surgical hardware noted. Cardiac leads noted. Median sternotomy with multiple wires and vascular clips. BOWEL: Nonobstructive bowel gas pattern. SOFT TISSUES: No abnormal calcifications. BONES: No acute fracture. PLEURAL SPACES: Left pleural effusion. IMPRESSION: 1. Enteric tube in place with tip overlying the expected region of the gastric antrum. 2. Left pleural effusion. Electronically signed by: Greig Pique MD MD 12/13/2024 03:22 PM EST RP Workstation: [id number]   ECHOCARDIOGRAM COMPLETE Result Date: 12/12/2024    ECHOCARDIOGRAM REPORT   Patient Name:   ANA LIAW Date of Exam: 12/12/2024 Medical Rec #:  [id number]        Height:       72.0 in Accession #:    [id number]       Weight:       228.0 lb Date of Birth:  January 15, 1941        BSA:          2.253 m Patient Age:    83 years         BP:           74/48 mmHg Patient Gender: M                HR:           61 bpm. Exam Location:  Inpatient Procedure: 2D Echo, Cardiac Doppler, Color Doppler and Intracardiac            Opacification Agent (Both Spectral and Color Flow Doppler were            utilized during procedure). Indications:     Shock R57.9  History:        Patient has prior history of Echocardiogram examinations, most                 recent 07/24/2024. Risk Factors:Hypertension.                 Aortic Valve: 29 mm Sapien prosthetic, stented (TAVR) valve is                 present in the aortic position. Procedure Date: 08/16/2023.  Sonographer:    Sydnee Wilson RDCS Referring Phys: [phone] STEPHEN K CHIU IMPRESSIONS  1. Left ventricular ejection fraction, by estimation, is 30 to 35%. The left ventricle has moderately decreased function. Left ventricular endocardial border not optimally defined to evaluate regional wall motion. The left ventricular internal cavity size was mildly dilated. There is mild left ventricular hypertrophy. Left ventricular diastolic parameters are consistent with Grade III diastolic dysfunction (restrictive).  2. Right ventricular systolic function was not well visualized. The right ventricular size is not well visualized. There is moderately elevated pulmonary artery systolic pressure.  3. The mitral valve was not well visualized. Mild mitral valve regurgitation.  4. The aortic valve has been repaired/replaced. Aortic valve regurgitation is not visualized. 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 Procedure Date: 08/16/2023. Echo findings are consistent with normal structure and function of the aortic valve prosthesis.  5. The inferior vena cava is dilated in size with <50% respiratory variability, suggesting right atrial pressure of 15 mmHg. Comparison(s): Prior images reviewed side by side. Conclusion(s)/Recommendation(s): Very difficult images, even with use of echo contrast. Unable to assess wall motion abnormalities given limitations. Moderate to severely reduced LVEF with grade 3 diastolic dysfunction and elevated right atrial pressure. FINDINGS  Left Ventricle: Left ventricular ejection fraction, by estimation, is 30 to 35%. The left ventricle has moderately decreased  function. Left ventricular endocardial border not optimally defined to evaluate regional wall motion. Definity  contrast agent was given IV to delineate the left ventricular endocardial borders. The left ventricular internal cavity size was mildly dilated. There is mild left ventricular hypertrophy. Left ventricular diastolic parameters are consistent with Grade III diastolic dysfunction (restrictive). Right Ventricle: The right ventricular size is not well visualized. Right vetricular wall thickness was not well visualized. Right ventricular systolic function was not well visualized. There is moderately elevated pulmonary artery systolic pressure. The  tricuspid regurgitant velocity is 3.34 m/s, and with an assumed right atrial pressure of 15 mmHg, the estimated right ventricular systolic pressure is 59.6 mmHg. Left Atrium: Left atrial size was not well visualized. Right Atrium: Right atrial size was not well visualized. Pericardium: The pericardium was not well visualized. Mitral Valve: The mitral valve was not well visualized. Mild mitral valve regurgitation. Tricuspid Valve: The tricuspid valve is not well visualized. Tricuspid valve regurgitation is mild . No evidence of tricuspid stenosis. Aortic Valve: The aortic valve has been repaired/replaced. Aortic valve regurgitation is not visualized. Aortic valve mean gradient measures 1.0 mmHg. Aortic valve peak gradient measures 2.7 mmHg. 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 Procedure Date: 08/16/2023. Echo findings are consistent with normal structure and function of the aortic valve prosthesis. Pulmonic Valve: The pulmonic valve was not well visualized. Pulmonic valve regurgitation is not visualized. No evidence of pulmonic stenosis. Aorta: The aortic root was not well visualized and the ascending aorta was not well visualized. Venous: The inferior vena cava is dilated in size with less than 50% respiratory  variability, suggesting right atrial pressure of 15 mmHg. IAS/Shunts: The interatrial septum was not well visualized. Additional Comments: A device lead is visualized in the right ventricle and right atrium.  LEFT VENTRICLE PLAX 2D LVIDd:         5.50 cm      Diastology LVIDs:         4.10 cm      LV e' medial:    4.13 cm/s LV PW:         1.40 cm      LV  E/e' medial:  18.5 LV IVS:        1.20 cm      LV e' lateral:   8.59 cm/s                             LV E/e' lateral: 8.9  LV Volumes (MOD) LV vol d, MOD A2C: 175.0 ml LV vol d, MOD A4C: 190.0 ml LV vol s, MOD A2C: 88.8 ml LV vol s, MOD A4C: 127.0 ml LV SV MOD A2C:     86.2 ml LV SV MOD A4C:     190.0 ml LV SV MOD BP:      74.9 ml RIGHT VENTRICLE RV S prime:     5.77 cm/s TAPSE (M-mode): 0.9 cm LEFT ATRIUM           Index        RIGHT ATRIUM           Index LA diam:      4.40 cm 1.95 cm/m   RA Area:     18.40 cm LA Vol (A2C): 25.3 ml 11.23 ml/m  RA Volume:   47.80 ml  21.22 ml/m LA Vol (A4C): 57.6 ml 25.57 ml/m  AORTIC VALVE AV Vmax:           81.80 cm/s AV Vmean:          56.500 cm/s AV VTI:            0.177 m AV Peak Grad:      2.7 mmHg AV Mean Grad:      1.0 mmHg LVOT Vmax:         60.70 cm/s LVOT Vmean:        44.000 cm/s LVOT VTI:          0.190 m LVOT/AV VTI ratio: 1.07  AORTA Ao Asc diam: 3.60 cm MITRAL VALVE               TRICUSPID VALVE MV Area (PHT): 5.34 cm    TR Peak grad:   44.6 mmHg MV Decel Time: 142 msec    TR Vmax:        334.00 cm/s MV E velocity: 76.30 cm/s MV A velocity: 26.60 cm/s  SHUNTS MV E/A ratio:  2.87        Systemic VTI: 0.19 m Shelda Bruckner MD Electronically signed by Shelda Bruckner MD Signature Date/Time: 12/12/2024/2:00:47 PM    Final    DG CHEST PORT 1 VIEW Result Date: 12/12/2024 CLINICAL DATA:  Congestive heart failure EXAM: PORTABLE CHEST 1 VIEW COMPARISON:  July 23, 2024 FINDINGS: Stable cardiomegaly. Status post coronary artery bypass graft and transcatheter aortic valve repair. Stable coarse reticular  densities are noted throughout both lungs most consistent with chronic interstitial scarring or fibrosis. Acute superimposed pulmonary edema or atypical inflammation cannot be excluded. Blunting of left costophrenic sulcus is noted consistent with small pleural effusion or chronic scarring. Bony thorax is unremarkable. IMPRESSION: Stable chronic interstitial lung disease is noted. Possible superimposed pulmonary edema or atypical inflammation cannot be excluded. Electronically Signed   By: Lynwood [name] Raddle M.D.   On: 12/12/2024 10:40      Subjective: Patient seen and examined at bedside today.  Looks comfortable. lying on bed.  Just come from dialysis.  Not in acute distress.  Complains of some itching.  Daughter at bedside.  He knew that today is Wednesday.  He still has rectal tube for diarrhea.  Denies  any abdominal pain, nausea, vomiting  Discharge Exam: Vitals:   12/25/24 1209 12/25/24 1221  BP: 124/65 (!) 133/59  Pulse: 70 69  Resp: 19 14  Temp: 97.8 F (36.6 C) 97.8 F (36.6 C)  SpO2: 100% 100%   Vitals:   12/25/24 1030 12/25/24 1100 12/25/24 1209 12/25/24 1221  BP: (!) 135/106 106/63 124/65 (!) 133/59  Pulse: 67 66 70 69  Resp: 14 19 19 14   Temp:   97.8 F (36.6 C) 97.8 F (36.6 C)  TempSrc:      SpO2: 100% 100% 100% 100%  Weight:      Height:        General: Pt is alert, awake, not in acute distress,deconditioned Cardiovascular: RRR, S1/S2 +, no rubs, no gallops Respiratory: CTA bilaterally, no wheezing, no rhonchi Abdominal: Soft, NT, ND, bowel sounds + Extremities: no edema, no cyanosis,B/L BKA GU: rectal tube    The results of significant diagnostics from this hospitalization (including imaging, microbiology, ancillary and laboratory) are listed below for reference.     Microbiology: No results found for this or any previous visit (from the past 240 hours).   Labs: BNP (last 3 results) Recent Labs    04/09/24 1909 04/11/24 2159 07/24/24 0407  BNP  566.3* 1,178.8* 354.2*   Basic Metabolic Panel: Recent Labs  Lab 12/21/24 0511 12/22/24 0500 12/23/24 0420 12/24/24 0448 12/25/24 0552  NA 135 137 136 136 133*  K 4.2 4.7 4.9 4.7 5.1  CL 96* 98 99 95* 94*  CO2 29 29 28 30 29   GLUCOSE 210* 155* 109* 123* 128*  BUN 71* 102* 110* 77* 99*  CREATININE 2.28* 2.74* 2.72* 2.31* 2.73*  CALCIUM  9.0 8.9 9.0 9.0 8.9  MG 2.2 2.4 2.4 2.2 2.4  PHOS 2.9 3.9 3.7 4.3 4.7*   Liver Function Tests: Recent Labs  Lab 12/22/24 0500 12/23/24 0420 12/24/24 0448 12/24/24 1157 12/25/24 0552  AST  --  40  --  48*  --   ALT  --  35  --  34  --   ALKPHOS  --  127*  --  136*  --   BILITOT  --  0.3  --  0.3  --   PROT  --  6.7  --  7.1  --   ALBUMIN  3.2* 3.1*  3.1* 3.3* 3.2* 3.3*   No results for input(s): LIPASE, AMYLASE in the last 168 hours. No results for input(s): AMMONIA in the last 168 hours. CBC: Recent Labs  Lab 12/19/24 0457 12/23/24 0420 12/24/24 1157 12/25/24 0552  WBC 10.7* 11.6* 14.3* 12.5*  NEUTROABS  --   --  10.8*  --   HGB 8.4* 8.5* 9.2* 8.9*  HCT 25.1* 26.0* 28.3* 27.1*  MCV 90.9 91.2 90.4 91.9  PLT 224 327 359 331   Cardiac Enzymes: No results for input(s): CKTOTAL, CKMB, CKMBINDEX, TROPONINI in the last 168 hours. BNP: Invalid input(s): POCBNP CBG: Recent Labs  Lab 12/24/24 2007 12/25/24 0016 12/25/24 0458 12/25/24 0900 12/25/24 1237  GLUCAP 161* 171* 127* 115* 114*   D-Dimer No results for input(s): DDIMER in the last 72 hours. Hgb A1c No results for input(s): HGBA1C in the last 72 hours. Lipid Profile No results for input(s): CHOL, HDL, LDLCALC, TRIG, CHOLHDL, LDLDIRECT in the last 72 hours. Thyroid function studies No results for input(s): TSH, T4TOTAL, T3FREE, THYROIDAB in the last 72 hours.  Invalid input(s): FREET3 Anemia work up No results for input(s): VITAMINB12, FOLATE, FERRITIN, TIBC, IRON , RETICCTPCT in the last  72 hours. Urinalysis     Component Value Date/Time   COLORURINE YELLOW 12/24/2024 1857   APPEARANCEUR HAZY (A) 12/24/2024 1857   LABSPEC 1.016 12/24/2024 1857   PHURINE 5.0 12/24/2024 1857   GLUCOSEU NEGATIVE 12/24/2024 1857   HGBUR NEGATIVE 12/24/2024 1857   BILIRUBINUR NEGATIVE 12/24/2024 1857   KETONESUR NEGATIVE 12/24/2024 1857   PROTEINUR 100 (A) 12/24/2024 1857   UROBILINOGEN 1.0 09/27/2011 1112   NITRITE NEGATIVE 12/24/2024 1857   LEUKOCYTESUR NEGATIVE 12/24/2024 1857   Sepsis Labs Recent Labs  Lab 12/19/24 0457 12/23/24 0420 12/24/24 1157 12/25/24 0552  WBC 10.7* 11.6* 14.3* 12.5*   Microbiology No results found for this or any previous visit (from the past 240 hours).  Please note: You were cared for by a hospitalist during your hospital stay. Once you are discharged, your primary care physician will handle any further medical issues. Please note that NO REFILLS for any discharge medications will be authorized once you are discharged, as it is imperative that you return to your primary care physician (or establish a relationship with a primary care physician if you do not have one) for your post hospital discharge needs so that they can reassess your need for medications and monitor your lab values.    Time coordinating discharge: 40 minutes  SIGNED:   Ivonne Mustache, MD  [facility] 12/25/2024, 1:57 PM Pager [phone]  If 7PM-7AM, please contact night-coverage [email or web address] Password [email or web address]     [1]  Allergies Allergen Reactions   Codeine Hives and Itching   Ofev  [Nintedanib ] Diarrhea    SEVERE DIARRHEA   Pirfenidone  Diarrhea and Other (See Comments)    Esbriet  (Pirfenidone ) causes elevated LFTs. D/C on 06/14/17 and SEVERE DIARRHEA    Amoxicillin Itching

## 2024-12-25 NOTE — Progress Notes (Addendum)
 PT Cancellation Note  Patient Details Name: Shane [name] MRN: [id number] DOB: Jan 03, 1941   Cancelled Treatment:    Reason Eval/Treat Not Completed: Patient at procedure or test/unavailable.  8:46- Off unit for HD.   12:42- Continues to be off unit. Will follow and re-attempt as schedule allows.   Kate ORN, PT, DPT Secure Chat Preferred  Rehab Office [phone]   Kate BRAVO [name] 12/25/2024, 8:46 AM

## 2024-12-25 NOTE — Progress Notes (Signed)
 IP rehab admissions - We do have approval for inpatient rehab admission.  Patient is currently in HD.  Will await MD clearance.  Bed available on CIR today if patient is medically stable for DC to inpatient rehab.  [phone]

## 2024-12-25 NOTE — Procedures (Signed)
 Patient seen on Hemodialysis. BP (!) 123/48   Pulse 65   Temp 97.7 F (36.5 C)   Resp 13   Ht 6' (1.829 m)   Wt 98.3 kg   SpO2 100%   BMI 29.39 kg/m   QB 300, UF goal 1L Tolerating treatment without complaints at this time.   Gordy Blanch MD [facility]. Office # [phone] Pager # [phone] 10:00 AM

## 2024-12-25 NOTE — Progress Notes (Signed)
" °   12/25/24 1221  Vitals  Temp 97.8 F (36.6 C)  Pulse Rate 69  Resp 14  BP (!) 133/59  SpO2 100 %  O2 Device Nasal Cannula  Type of Weight Post-Dialysis  Oxygen Therapy  O2 Flow Rate (L/min) 2 L/min  Patient Activity (if Appropriate) In bed  Pulse Oximetry Type Continuous  Oximetry Probe Site Changed No  Post Treatment  Dialyzer Clearance Lightly streaked  Hemodialysis Intake (mL) 0 mL  Liters Processed 67.5  Fluid Removed (mL) 1000 mL  Tolerated HD Treatment Yes  Post-Hemodialysis Comments Pt. tolerated tx well    "

## 2024-12-25 NOTE — Progress Notes (Signed)
 Pt. Came in awake and oriented with no complaints.. Consent verified and on file.  Started with no complaints  UF removal: Tx duration: 3.75 hours  Access used: Right IJ Access issue: None  Tx completed and tolerated Catheter locked and dwelled with heparin  Endorsed to floor nurse. Transported to room.   Lanna Labella Rubi Micala Saltsman, RN Kidney Care Unit

## 2024-12-26 DIAGNOSIS — I480 Paroxysmal atrial fibrillation: Secondary | ICD-10-CM

## 2024-12-26 DIAGNOSIS — L299 Pruritus, unspecified: Secondary | ICD-10-CM

## 2024-12-26 DIAGNOSIS — N183 Chronic kidney disease, stage 3 unspecified: Secondary | ICD-10-CM

## 2024-12-26 DIAGNOSIS — R197 Diarrhea, unspecified: Secondary | ICD-10-CM

## 2024-12-26 DIAGNOSIS — Z794 Long term (current) use of insulin: Secondary | ICD-10-CM

## 2024-12-26 DIAGNOSIS — Z89512 Acquired absence of left leg below knee: Secondary | ICD-10-CM

## 2024-12-26 DIAGNOSIS — E11649 Type 2 diabetes mellitus with hypoglycemia without coma: Secondary | ICD-10-CM

## 2024-12-26 DIAGNOSIS — N179 Acute kidney failure, unspecified: Secondary | ICD-10-CM

## 2024-12-26 LAB — GLUCOSE, CAPILLARY
Glucose-Capillary: 117 mg/dL — ABNORMAL HIGH (ref 70–99)
Glucose-Capillary: 122 mg/dL — ABNORMAL HIGH (ref 70–99)
Glucose-Capillary: 123 mg/dL — ABNORMAL HIGH (ref 70–99)
Glucose-Capillary: 125 mg/dL — ABNORMAL HIGH (ref 70–99)
Glucose-Capillary: 141 mg/dL — ABNORMAL HIGH (ref 70–99)
Glucose-Capillary: 147 mg/dL — ABNORMAL HIGH (ref 70–99)
Glucose-Capillary: 149 mg/dL — ABNORMAL HIGH (ref 70–99)

## 2024-12-26 MED ORDER — SODIUM CHLORIDE 0.9% FLUSH: 10.0000 mL | INTRAVENOUS | Status: DC | PRN

## 2024-12-26 MED ORDER — CHLORHEXIDINE GLUCONATE CLOTH 2 % EX PADS
6.0000 | MEDICATED_PAD | Freq: Two times a day (BID) | CUTANEOUS | Status: DC
  Administered 2024-12-26 – 2024-12-31 (×10): 6 via TOPICAL

## 2024-12-26 MED ORDER — INSULIN ASPART 100 UNIT/ML IJ SOLN
2.0000 [IU] | INTRAMUSCULAR | Status: DC
  Administered 2024-12-27 – 2024-12-28 (×7): 2 [IU] via SUBCUTANEOUS
  Filled 2024-12-26 (×10): qty 2

## 2024-12-26 MED ORDER — SODIUM CHLORIDE 0.9% FLUSH
10.0000 mL | Freq: Two times a day (BID) | INTRAVENOUS | Status: DC
  Administered 2024-12-26 – 2025-01-07 (×25): 10 mL
  Administered 2025-01-08: 20 mL
  Administered 2025-01-08 – 2025-01-10 (×4): 10 mL

## 2024-12-26 MED ORDER — GERHARDT'S BUTT CREAM
1.0000 | TOPICAL_CREAM | Freq: Three times a day (TID) | CUTANEOUS | Status: AC
  Administered 2024-12-26 – 2025-01-02 (×19): 1 via TOPICAL
  Filled 2024-12-26 (×2): qty 60

## 2024-12-26 NOTE — Plan of Care (Signed)
" °  Problem: Consults Goal: RH GENERAL PATIENT EDUCATION Description: See Patient Education module for education specifics. Outcome: Progressing   Problem: RH BOWEL ELIMINATION Goal: RH STG MANAGE BOWEL WITH ASSISTANCE Description: STG Manage Bowel with min Assistance. Outcome: Progressing   Problem: RH BLADDER ELIMINATION Goal: RH STG MANAGE BLADDER WITH ASSISTANCE Description: STG Manage Bladder With min  Assistance Outcome: Progressing   Problem: RH SKIN INTEGRITY Goal: RH STG SKIN FREE OF INFECTION/BREAKDOWN Description: Manage skin free of infection with min assistance Outcome: Progressing   Problem: RH SAFETY Goal: RH STG ADHERE TO SAFETY PRECAUTIONS W/ASSISTANCE/DEVICE Description: STG Adhere to Safety Precautions With min Assistance/Device. Outcome: Progressing   Problem: RH PAIN MANAGEMENT Goal: RH STG PAIN MANAGED AT OR BELOW PT'S PAIN GOAL Description: <4 w/ prns Outcome: Progressing   Problem: RH KNOWLEDGE DEFICIT GENERAL Goal: RH STG INCREASE KNOWLEDGE OF SELF CARE AFTER HOSPITALIZATION Description: Manage knowledge of self care after hospitalization with min assistance from spouse using educational materials provided  Outcome: Progressing   "

## 2024-12-26 NOTE — Plan of Care (Signed)
" °  Problem: RH Swallowing Goal: LTG Pt will demonstrate functional change in swallow as evidenced by bedside/clinical objective assessment (SLP) Description: LTG: Patient will demonstrate functional change in swallow as evidenced by bedside/clinical objective assessment (SLP) Flowsheets (Taken 12/26/2024 1648) LTG: Patient will demonstrate functional change in swallow as evidenced by bedside/clinical objective assessment: Oropharyngeal swallow   Problem: RH Problem Solving Goal: LTG Patient will demonstrate problem solving for (SLP) Description: LTG:  Patient will demonstrate problem solving for basic/complex daily situations with cues  (SLP) Flowsheets (Taken 12/26/2024 1648) LTG: Patient will demonstrate problem solving for (SLP): Complex daily situations LTG Patient will demonstrate problem solving for: Modified Independent   Problem: RH Memory Goal: LTG Patient will demonstrate ability for day to day (SLP) Description: LTG:   Patient will demonstrate ability for day to day recall/carryover during cognitive/linguistic activities with assist  (SLP) Flowsheets (Taken 12/26/2024 1648) LTG: Patient will demonstrate ability for day to day recall:  New information  Daily complex information LTG: Patient will demonstrate ability for day to day recall/carryover during cognitive/linguistic activities with assist (SLP): Modified Independent   "

## 2024-12-26 NOTE — Progress Notes (Signed)
 "                                                        PROGRESS NOTE   Subjective/Complaints: Some itching over his back, atarax  helped. Pain controlled overall with current regimen.  Denies difficulty urinating, has had low volumes recorded.   LBM yesterday   ROS: Patient denies fever, new vision changes, dizziness, nausea, vomiting, diarrhea,  shortness of breath or chest pain, headache, or mood change.   Objective:   No results found. Recent Labs    12/24/24 1157 12/25/24 0552  WBC 14.3* 12.5*  HGB 9.2* 8.9*  HCT 28.3* 27.1*  PLT 359 331   Recent Labs    12/24/24 0448 12/25/24 0552  NA 136 133*  K 4.7 5.1  CL 95* 94*  CO2 30 29  GLUCOSE 123* 128*  BUN 77* 99*  CREATININE 2.31* 2.73*  CALCIUM  9.0 8.9    Intake/Output Summary (Last 24 hours) at 12/26/2024 2001 Last data filed at 12/26/2024 1931 Gross per 24 hour  Intake --  Output 375 ml  Net -375 ml        Physical Exam: Vital Signs Blood pressure (!) 108/50, pulse 60, temperature 98.4 F (36.9 C), temperature source Oral, resp. rate 17, height 5' (1.524 m), weight 106.1 kg, SpO2 100%.  General: No apparent distress, obese HEENT: Head is normocephalic, atraumatic, + NG tube Neck: Supple without JVD or lymphadenopathy Heart: Reg rate and rhythm.  Chest: CTA bilaterally, R internal jugular TDC Abdomen: Soft, non-tender, non-distended, bowel sounds positive. Extremities: Left BKA is dressed with wound VAC.  Right BKA well-healed.  Psych: Pt's affect is appropriate. Pt is cooperative Skin: bruising noted on Ues, no significant rash noted on his back Neuro:   Alert and awake, follows simple commands, CN 2-12 grossly intact     Assessment/Plan: 1. Functional deficits which require 3+ hours per day of interdisciplinary therapy in a comprehensive inpatient rehab setting. Physiatrist is providing close team supervision and 24 hour management of active medical problems listed below. Physiatrist and rehab  team continue to assess barriers to discharge/monitor patient progress toward functional and medical goals  Care Tool:  Bathing    Body parts bathed by patient: Right arm, Left arm, Chest, Abdomen, Face, Front perineal area   Body parts bathed by helper: Buttocks, Right upper leg, Left upper leg, Right lower leg Body parts n/a: Left lower leg   Bathing assist Assist Level: Maximal Assistance - Patient 24 - 49%     Upper Body Dressing/Undressing Upper body dressing   What is the patient wearing?: Hospital gown only    Upper body assist Assist Level: Minimal Assistance - Patient > 75%    Lower Body Dressing/Undressing Lower body dressing      What is the patient wearing?: Hospital gown only     Lower body assist Assist for lower body dressing: Dependent - Patient 0%     Toileting Toileting    Toileting assist Assist for toileting: Dependent - Patient 0%     Transfers Chair/bed transfer  Transfers assist  Chair/bed transfer activity did not occur: Safety/medical concerns (weakness/fatigue/pain/nausea)        Locomotion Ambulation   Ambulation assist   Ambulation activity did not occur: Safety/medical concerns (B BKA)  Walk 10 feet activity   Assist  Walk 10 feet activity did not occur: Safety/medical concerns (B BKA)        Walk 50 feet activity   Assist Walk 50 feet with 2 turns activity did not occur: Safety/medical concerns (B BKA)         Walk 150 feet activity   Assist Walk 150 feet activity did not occur: Safety/medical concerns (B BKA)         Walk 10 feet on uneven surface  activity   Assist Walk 10 feet on uneven surfaces activity did not occur: Safety/medical concerns (B BKA)         Wheelchair     Assist Is the patient using a wheelchair?: Yes Type of Wheelchair: Manual Wheelchair activity did not occur: Safety/medical concerns (weakness/fatigue/pain/nausea)         Wheelchair 50 feet with 2  turns activity    Assist    Wheelchair 50 feet with 2 turns activity did not occur: Safety/medical concerns (weakness/fatigue/pain/nausea)       Wheelchair 150 feet activity     Assist  Wheelchair 150 feet activity did not occur: Safety/medical concerns (weakness/fatigue/pain/nausea)       Blood pressure (!) 108/50, pulse 60, temperature 98.4 F (36.9 C), temperature source Oral, resp. rate 17, height 5' (1.524 m), weight 106.1 kg, SpO2 100%.  Medical Problem List and Plan: 1. 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  Antithrombotics: -DVT/anticoagulation:  Pharmaceutical: Eliquis              -antiplatelet therapy: N/A 3. Pain Management: Neurontin  100 mg nightly, oxycodone  as needed, Robaxin  as needed 4. Mood/Behavior/Sleep: Rozerem  8 mg nightly, trazodone  75 mg nightly as needed             -antipsychotic agents: N/A 5. Neuropsych/cognition: This patient is not capable of making decisions on his own behalf. 6. Skin/Wound Care: Routine skin checks 7. Fluids/Electrolytes/Nutrition: Routine and analysis with follow-up chemistries 8.  History of right BKA 2022.  Patient received CIR.  Intermittent use of prosthesis 9.  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  CRRT 1/10 - 1/12 transition to intermittent hemodialysis.  Follow-up renal services  -1/22 reviewed nephrology note, monitoring for need for dialysis 10.  Paroxysmal atrial fibrillation.  Continue Eliquis .    -HR stable    12/26/2024    7:30 PM 12/26/2024    2:11 PM 12/26/2024    6:30 AM  Vitals with BMI  Height   5' 0  Weight   234 lbs  BMI   45.7  Systolic 108 96 99  Diastolic 50 59 56  Pulse 60 73 62    11.  Acute on chronic anemia.  Follow-up CBC.  Continue  iron  supplement 12.  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  NovoLog  4 units every 4 hours, Lantus  insulin  18 units twice daily. -1/22 had mild hypoglycemia yesterday, decrease novolog  to 2 units Q4h CBG (last 3)  Recent Labs    12/26/24 0821 12/26/24 1151 12/26/24 1630  GLUCAP 117* 123* 125*    13.  Hypotension.  ProAmatine   5 mg every 8 hours.  Monitor with increased mobility 14.  Chronic systolic congestive heart failure.  Echocardiogram with ejection fraction of 30 to 35%.  Monitor for any signs of fluid overload 15.  CAD/AV block/Mobitz 1/aortic stenosis/CABG 2021.  Follow-up cardiology services. 16.  Idiopathic pulmonary fibrosis/OSA.  Followed by Dr. Geronimo.  Patient with 2 L oxygen intermittently at home.  Currently not on CPAP until tube feeds discontinued   17.  Oropharyngeal dysphagia.  Follow-up speech therapy.  Currently with Cortrak feeding tube for nutritional support.  Advance diet as tolerated. Currently NPO with ice chips allowed   18.  Diarrhea.  Related to tube feeds.  Flexi-Seal in place: continue Continue Imodium .  -1/22 LBM yesterday, continue to monitor    19.  GERD: continue Protonix    20.  Hyperlipidemia: continue Lipitor   21. Itching.  -Continue atarax  PRN    LOS: 1 days A FACE TO FACE EVALUATION WAS PERFORMED  Murray Collier 12/26/2024, 8:01 PM     "

## 2024-12-26 NOTE — Plan of Care (Signed)
" °  Problem: RH Balance Goal: LTG Patient will maintain dynamic sitting balance (PT) Description: LTG:  Patient will maintain dynamic sitting balance with assistance during mobility activities (PT) Flowsheets (Taken 12/26/2024 1643) LTG: Pt will maintain dynamic sitting balance during mobility activities with:: Supervision/Verbal cueing   Problem: RH Bed Mobility Goal: LTG Patient will perform bed mobility with assist (PT) Description: LTG: Patient will perform bed mobility with assistance, with/without cues (PT). Flowsheets (Taken 12/26/2024 1643) LTG: Pt will perform bed mobility with assistance level of: Supervision/Verbal cueing   Problem: RH Bed to Chair Transfers Goal: LTG Patient will perform bed/chair transfers w/assist (PT) Description: LTG: Patient will perform bed to chair transfers with assistance (PT). Flowsheets (Taken 12/26/2024 1643) LTG: Pt will perform Bed to Chair Transfers with assistance level: Minimal Assistance - Patient > 75%   Problem: RH Car Transfers Goal: LTG Patient will perform car transfers with assist (PT) Description: LTG: Patient will perform car transfers with assistance (PT). Flowsheets (Taken 12/26/2024 1643) LTG: Pt will perform car transfers with assist:: Moderate Assistance - Patient 50 - 74%   Problem: RH Wheelchair Mobility Goal: LTG Patient will propel w/c in controlled environment (PT) Description: LTG: Patient will propel wheelchair in controlled environment, # of feet with assist (PT) Flowsheets (Taken 12/26/2024 1643) LTG: Pt will propel w/c in controlled environ  assist needed:: Supervision/Verbal cueing LTG: Propel w/c distance in controlled environment: 150 ft Goal: LTG Patient will propel w/c in home environment (PT) Description: LTG: Patient will propel wheelchair in home environment, # of feet with assistance (PT). Flowsheets (Taken 12/26/2024 1643) LTG: Pt will propel w/c in home environ  assist needed:: Supervision/Verbal cueing LTG:  Propel w/c distance in home environment: 50 ft   "

## 2024-12-26 NOTE — Progress Notes (Signed)
 Inpatient Rehabilitation  Patient information reviewed and entered into eRehab system by [facility]. Karen Kays., CCC/SLP, PPS Coordinator.  Information including medical coding, functional ability and quality indicators will be reviewed and updated through discharge.

## 2024-12-26 NOTE — Progress Notes (Signed)
 Patient ID: Shane [name], male   DOB: 1941-11-13, 84 y.o.   MRN: [id number] [facility] Progress Note   Assessment/ Plan:   1. Acute kidney Injury on chronic kidney disease stage III: Secondary to ATN with dependency on CRRT between 1/10 - 1/12 and then transitioned onto intermittent hemodialysis on MWF schedule.  Oliguric overnight and yesterday's labs without any clear evidence of renal recovery.  The plan is to undertake daily labs and monitor urine output to determine need for dialysis.  If he continues to require dialysis through the weekend, will need conversion to tunneled hemodialysis catheter. 2.  Septic shock: Secondary to gangrene of left foot status post left BKA on 12/11/2024.  Transitioned to midodrine  after previously requiring pressors.  Completed Zyvox  and Zosyn . 3.  Paroxysmal atrial fibrillation: Rate controlled with ongoing anticoagulation with Eliquis . 4.  Anemia: Secondary to chronic illness and recent exacerbation by postop losses.  Will continue to monitor to determine need for adjustment of ESA therapy. 5.  Status post left below-knee amputation: With evidence of peripheral vascular disease and gangrene of left foot.  Subjective:   Underwent hemodialysis uneventfully yesterday and transferred to CIR thereafter.   Objective:   BP (!) 99/56   Pulse 62   Temp 97.6 F (36.4 C) (Oral)   Resp 17   Ht 5' (1.524 m)   Wt 106.1 kg   SpO2 100%   BMI 45.70 kg/m   Intake/Output Summary (Last 24 hours) at 12/26/2024 0959 Last data filed at 12/26/2024 0454 Gross per 24 hour  Intake --  Output 375 ml  Net -375 ml   Weight change:   Physical Exam: Gen: Comfortably resting in bed, being assisted with repositioning in bed CVS: Pulse regular rhythm, normal rate, S1 and S2 normal Resp: Clear to auscultation bilaterally, no rales/rhonchi.  Right IJ temporary dialysis catheter Abd: Soft, obese, nontender, bowel sounds normal Ext: Status post left BKA with wound  VAC in situ.  Status post prior right BKA.  Imaging: CT ABDOMEN PELVIS WO CONTRAST Result Date: 12/25/2024 CLINICAL DATA:  Abdominal pain. EXAM: CT ABDOMEN AND PELVIS WITHOUT CONTRAST TECHNIQUE: Multidetector CT imaging of the abdomen and pelvis was performed following the standard protocol without IV contrast. RADIATION DOSE REDUCTION: This exam was performed according to the departmental dose-optimization program which includes automated exposure control, adjustment of the mA and/or kV according to patient size and/or use of iterative reconstruction technique. COMPARISON:  07/23/2024 of FINDINGS: Lower chest: Chronic interstitial lung disease noted in both lower lungs is similar to prior. The heart is enlarged. Status post TAVR. Hepatobiliary: No suspicious focal abnormality in the liver on this study without intravenous contrast. There is no evidence for gallstones, gallbladder wall thickening, or pericholecystic fluid. No intrahepatic or extrahepatic biliary dilation. Pancreas: No focal mass lesion. No dilatation of the main duct. No intraparenchymal cyst. No peripancreatic edema. Spleen: No splenomegaly. No suspicious focal mass lesion. Adrenals/Urinary Tract: No adrenal nodule or mass. Kidneys unremarkable. No evidence for hydroureter. The urinary bladder appears normal for the degree of distention. Stomach/Bowel: Stomach is unremarkable. No gastric wall thickening. No evidence of outlet obstruction. Feeding tube tip is in the descending duodenum. No small bowel wall thickening. No small bowel dilatation. The terminal ileum is normal. The appendix is normal. No gross colonic mass. No colonic wall thickening. Rectal tube evident. Vascular/Lymphatic: There is moderate atherosclerotic calcification of the abdominal aorta without aneurysm. There is no gastrohepatic or hepatoduodenal ligament lymphadenopathy. No retroperitoneal or mesenteric lymphadenopathy. No  pelvic sidewall lymphadenopathy. Reproductive: The  prostate gland and seminal vesicles are unremarkable. Other: No substantial intraperitoneal free fluid. Rim enhancing retroperitoneal collection seen on the previous study has decreased in the interval measuring 4.0 x 1.6 cm today compared to 5.3 x 2.7 cm previously. Features likely reflect resolving hematoma or residual granulation/scar from prior infection. Musculoskeletal: No worrisome lytic or sclerotic osseous abnormality. IMPRESSION: 1. No acute findings in the abdomen or pelvis. Specifically, no findings to explain the patient's history of abdominal pain. 2. Interval decrease in size of the right retroperitoneal collection seen on the previous study. Previous study performed with contrast shows some peripheral enhancement of this collection. Features likely reflect chronic resolving hematoma or residual granulation/scar from prior infection. 3. Chronic interstitial lung disease in both lower lungs, similar to prior. 4.  Aortic Atherosclerosis (ICD10-I70.0). Electronically Signed   By: Camellia Candle M.D.   On: 12/25/2024 05:36    Labs: BMET Recent Labs  Lab 12/20/24 0430 12/21/24 0511 12/22/24 0500 12/23/24 0420 12/24/24 0448 12/25/24 0552  NA 135 135 137 136 136 133*  K 4.2 4.2 4.7 4.9 4.7 5.1  CL 97* 96* 98 99 95* 94*  CO2 26 29 29 28 30 29   GLUCOSE 136* 210* 155* 109* 123* 128*  BUN 118* 71* 102* 110* 77* 99*  CREATININE 3.77* 2.28* 2.74* 2.72* 2.31* 2.73*  CALCIUM  9.0 9.0 8.9 9.0 9.0 8.9  PHOS 4.2 2.9 3.9 3.7 4.3 4.7*   CBC Recent Labs  Lab 12/23/24 0420 12/24/24 1157 12/25/24 0552  WBC 11.6* 14.3* 12.5*  NEUTROABS  --  10.8*  --   HGB 8.5* 9.2* 8.9*  HCT 26.0* 28.3* 27.1*  MCV 91.2 90.4 91.9  PLT 327 359 331    Medications:     acidophilus  1 capsule Oral Daily   apixaban   2.5 mg Per Tube BID   vitamin C   1,000 mg Per Tube Daily   atorvastatin   80 mg Per Tube Daily   Chlorhexidine  Gluconate Cloth  6 each Topical BID   feeding supplement (PROSource TF20)  60 mL  Per Tube BID   ferrous sulfate   300 mg Per Tube Q breakfast   gabapentin   100 mg Per Tube QHS   insulin  aspart  0-15 Units Subcutaneous Q4H   insulin  aspart  4 Units Subcutaneous Q4H   insulin  glargine  18 Units Subcutaneous BID   midodrine   5 mg Per Tube Q8H   multivitamin with minerals  1 tablet Per Tube q AM   nutrition supplement (JUVEN)  1 packet Per Tube BID BM   mouth rinse  15 mL Mouth Rinse 4 times per day   pantoprazole  (PROTONIX ) IV  40 mg Intravenous BID   ramelteon   8 mg Per Tube QHS   sodium chloride  flush  10-40 mL Intracatheter Q12H   tamsulosin   0.4 mg Oral QPC supper    Gordy Blanch, MD 12/26/2024, 9:59 AM

## 2024-12-26 NOTE — Progress Notes (Signed)
 Inpatient Rehabilitation Admission Medication Review by a Pharmacist  A complete drug regimen review was completed for this patient to identify any potential clinically significant medication issues.  High Risk Drug Classes Is patient taking? Indication by Medication  Antipsychotic No   Anticoagulant Yes Apixaban  - a fib  Antibiotic No   Opioid Yes Oxycodone  - pain  Antiplatelet No   Hypoglycemics/insulin  Yes Insulin  - DM  Vasoactive Medication Yes Midodrine  - blood pressure support  Chemotherapy No   Other Yes Atorvastatin  - HLD Gabapentin  - pain Protonix  - GERD Rozerem  and trazadone - sleep Flomax  - BPH Hydroxyzine  - anxiety Vit C, Iron  and MVI - supplementation     Type of Medication Issue Identified Description of Issue Recommendation(s)  Drug Interaction(s) (clinically significant)     Duplicate Therapy     Allergy     No Medication Administration End Date     Incorrect Dose     Additional Drug Therapy Needed     Significant med changes from prior encounter (inform family/care partners about these prior to discharge). Apixaban  home dose 5 mg bid; due to renal fxn changed to 2.5 mg bid Lexapro  - on PTA  Lasix  - on PTA  Ativan  - on PTA Apixaban  - inform family of dose change Lexapro  - assess need to restart versus discontinue from  home meds Lasix  - assess need to restart versus discontinue from  home meds Ativan  - assess need to restart versus discontinue from  home meds  Other       Clinically significant medication issues were identified that warrant physician communication and completion of prescribed/recommended actions by midnight of the next day:  No  Name of provider notified for urgent issues identified:   Provider Method of Notification:     Pharmacist comments:   Time spent performing this drug regimen review (minutes):  20   Shane [name] Alert 12/26/2024 7:08 AM

## 2024-12-26 NOTE — Evaluation (Signed)
 Occupational Therapy Assessment and Plan  Patient Details  Name: Shane [name] MRN: [id number] Date of Birth: [birth year]  OT Diagnosis: abnormal posture and muscle weakness (generalized) Rehab Potential: Rehab Potential (ACUTE ONLY): Good ELOS: 2-3 weeks   Today's Date: 12/26/2024 OT Individual Time: 9052-8954 OT Individual Time Calculation (min): 58 min     Hospital Problem: Principal Problem:   Left below-knee amputee [facility])   Past Medical History:  Past Medical History:  Diagnosis Date   Acute blood loss anemia    Anxiety    AV block, Mobitz 1    Cataract    Chronic kidney disease    d/t DM   CKD (chronic kidney disease), stage III (HCC)    COVID-19 virus infection 07/18/2020   Last Assessment & Plan:   Formatting of this note might be different from the original.  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 10/2019 Echo: EF 55-60%, Gr2 DD. Nl RV.    Neuromuscular disorder (HCC)    Neuropathy associated with endocrine disorder    Nonobstructive CAD (coronary artery disease)    a. 2012 Cath: mod, nonobs dzs; b. 10/2016 MV: EF 60%, no ischemia.   OSA on CPAP 05/05/2017   Unattended Home Sleep Test 7/2/813-AHI 38.6/hour, desaturation to 64%, body weight 261 pounds   PONV (postoperative nausea and vomiting)    Postoperative anemia due to acute blood loss 11/07/2020   Postoperative hemorrhagic shock 03/20/2021   Sleep apnea     uses cpap asked to bring mask and tubing   Past Surgical History:  Past Surgical History:   Procedure Laterality Date   ABDOMINAL AORTOGRAM W/LOWER EXTREMITY N/A 12/10/2020   Procedure: ABDOMINAL AORTOGRAM W/LOWER EXTREMITY;  Surgeon: Magda Debby SAILOR, MD;  Location: [facility] INVASIVE CV LAB;  Service: Cardiovascular;  Laterality: N/A;   AMPUTATION Right 01/22/2021   Procedure: 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;  Surgeon: Harden Jerona GAILS, MD;  Location: [facility] OR;  Service: Orthopedics;  Laterality: Right;   AMPUTATION Right 03/17/2021   Procedure: RIGHT BELOW KNEE AMPUTATION;  Surgeon: Harden Jerona GAILS, MD;  Location: [facility] OR;  Service: Orthopedics;  Laterality: Right;   AMPUTATION Left 12/11/2024   Procedure: AMPUTATION BELOW KNEE;  Surgeon: Harden Jerona GAILS, MD;  Location: [facility] OR;  Service: Orthopedics;  Laterality: Left;   ANKLE FUSION Right 01/22/2021   Procedure: RIGHT FOOT TIBIOCALCANEAL FUSION;  Surgeon: Harden Jerona GAILS, MD;  Location: [facility] OR;  Service: Orthopedics;  Laterality: Right;   ANTERIOR FUSION CERVICAL SPINE  2012   APPLICATION OF WOUND VAC Left 12/11/2024   Procedure: APPLICATION, WOUND VAC;  Surgeon: Harden Jerona GAILS, MD;  Location: [facility] OR;  Service: Orthopedics;  Laterality: Left;   CARDIAC CATHETERIZATION  2011   CARDIAC CATHETERIZATION N/A 11/09/2016   Procedure: Right Heart Cath;  Surgeon: Victory LELON Sharps, MD;  Location: [facility] INVASIVE CV LAB;  Service: Cardiovascular;  Laterality: N/A;   carpel tunnel     left wrist  CATARACT EXTRACTION     CATARACT EXTRACTION W/ INTRAOCULAR LENS  IMPLANT, BILATERAL  2013   CERVICAL LAMINECTOMY  2012   COLONOSCOPY N/A 01/14/2013   Procedure: COLONOSCOPY;  Surgeon: Norleen LOISE Kiang, MD;  Location: [facility] ENDOSCOPY;  Service: Endoscopy;  Laterality: N/A;   CORONARY ARTERY BYPASS GRAFT  11/04/2020   LIMA-LAD, SVG-OM1, SVG-PDA (Dr Norleen Exon [facility]) dc 11/18/2020   EYE SURGERY     I & D EXTREMITY Right 02/19/2021   Procedure: RIGHT ANKLE DEBRIDEMENT AND PLACEMENT ANTIBIOTIC BEADS;  Surgeon: Harden Jerona GAILS, MD;  Location: [facility] OR;  Service: Orthopedics;  Laterality: Right;   KNEE  SURGERY  1998   left   LEFT HEART CATH AND CORONARY ANGIOGRAPHY N/A 07/10/2020   Procedure: LEFT HEART CATH AND CORONARY ANGIOGRAPHY;  Surgeon: Wonda Sharper, MD;  Location: [facility] INVASIVE CV LAB;  Service: Cardiovascular;  Laterality: N/A;   LUMBAR LAMINECTOMY  2003   LUNG BIOPSY Left 12/26/2016   Procedure: LUNG BIOPSY;  Surgeon: Elspeth JAYSON Millers, MD;  Location: [facility] OR;  Service: Thoracic;  Laterality: Left;   PACEMAKER IMPLANT N/A 03/30/2020   Procedure: PACEMAKER IMPLANT;  Surgeon: Waddell Danelle ORN, MD;  Location: [facility] LAB;  Service: Cardiovascular;  Laterality: N/A;   PERIPHERAL VASCULAR INTERVENTION Right 12/10/2020   Procedure: PERIPHERAL VASCULAR INTERVENTION;  Surgeon: Magda Debby LOISE, MD;  Location: [facility] CV LAB;  Service: Cardiovascular;  Laterality: Right;  SFA   POSTERIOR FUSION CERVICAL SPINE  2012   PPM GENERATOR REMOVAL N/A 12/14/2020   Procedure: PPM GENERATOR REMOVAL;  Surgeon: Waddell Danelle ORN, MD;  Location: [facility] INVASIVE CV LAB;  Service: Cardiovascular;  Laterality: N/A;   TEE WITHOUT CARDIOVERSION N/A 12/11/2020   Procedure: TRANSESOPHAGEAL ECHOCARDIOGRAM (TEE);  Surgeon: Barbaraann Darryle Debby, MD;  Location: [facility] ENDOSCOPY;  Service: Cardiovascular;  Laterality: N/A;   TRIGGER FINGER RELEASE  2011   4th finger left hand   VIDEO ASSISTED THORACOSCOPY Left 12/26/2016   Procedure: VIDEO ASSISTED THORACOSCOPY;  Surgeon: Elspeth JAYSON Millers, MD;  Location: [facility] OR;  Service: Thoracic;  Laterality: Left;   VIDEO BRONCHOSCOPY N/A 12/26/2016   Procedure: VIDEO BRONCHOSCOPY;  Surgeon: Elspeth JAYSON Millers, MD;  Location: [facility] OR;  Service: Thoracic;  Laterality: N/A;    Assessment & Plan Clinical Impression: Patient is a 84 y.o. year old male with recent admission to the hospital on 12/11/2024 for a left BKA by Dr. Harden with application of wound VAC. Postoperative 1/8 he became hypotensive and subsequently transferred to the ICU, required pressor support which had been since  weaned off to midodrine  5 mg every 8 hours as well as placed on a course of linezolid /Zosyn  for suspect sepsis and completing course. Echocardiogram with ejection fraction of 30 to 35% grade 3 diastolic dysfunction. Hospitalization was complicated by AKI on CKD stage III requiring CRRT 1/10 - 1/12 with follow-up nephrology services transitioned into intermittent hemodialysis.  Patient transferred to CIR on 12/25/2024 .    Patient currently requires total with basic self-care skills secondary to muscle weakness and decreased sitting balance and decreased balance strategies.  Prior to hospitalization, patient could complete BADL's with min.  Patient will benefit from skilled intervention to increase independence with basic self-care skills prior to discharge home with care partner.  Anticipate patient will require 24 hour supervision and minimal physical assistance and follow up home health.  OT - End of Session Activity Tolerance: Improving;Tolerates 10 - 20 min activity with multiple rests Endurance Deficit: Yes OT Assessment Rehab Potential (  ACUTE ONLY): Good OT Barriers to Discharge: Wound Care;New oxygen;Weight bearing restrictions;Incontinence OT Patient demonstrates impairments in the following area(s): Balance;Pain;Safety;Cognition;Sensory;Endurance;Skin Integrity;Motor OT Basic ADL's Functional Problem(s): Eating;Grooming;Bathing;Dressing;Toileting OT Transfers Functional Problem(s): Toilet;Tub/Shower OT Additional Impairment(s): Fuctional Use of Upper Extremity OT Plan OT Intensity: Minimum of 1-2 x/day, 45 to 90 minutes OT Frequency: 5 out of 7 days OT Duration/Estimated Length of Stay: 2-3 weeks OT Treatment/Interventions: Discharge planning;Pain management;Self Care/advanced ADL retraining;Therapeutic Activities;UE/LE Coordination activities;Therapeutic Exercise;Skin care/wound managment;Patient/family education;Functional mobility training;Disease mangement/prevention;Cognitive  remediation/compensation;DME/adaptive equipment instruction;UE/LE Strength taining/ROM;Wheelchair propulsion/positioning OT Self Feeding Anticipated Outcome(s): Supervision OT Basic Self-Care Anticipated Outcome(s): Min assist OT Toileting Anticipated Outcome(s): Min assist using BSC OT Bathroom Transfers Anticipated Outcome(s): Min assist OT Recommendation Recommendations for Other Services: Speech consult;Therapeutic Recreation consult Therapeutic Recreation Interventions: Stress management Patient destination: Home Follow Up Recommendations: Home health OT;24 hour supervision/assistance Equipment Recommended: To be determined Equipment Details: Patient may need education on roll- in style shower chairs.   OT Evaluation Precautions/Restrictions  Precautions Precautions: Fall Recall of Precautions/Restrictions: Intact Precaution/Restrictions Comments: watch BP wound VAC, LLE splint Restrictions Weight Bearing Restrictions Per Provider Order: Yes LLE Weight Bearing Per Provider Order: Non weight bearing General Chart Reviewed: Yes Family/Caregiver Present: No Vital Signs   Pain Pain Assessment Pain Scale: 0-10 Pain Score: 4  Pain Type: Surgical pain Pain Location: Leg Pain Orientation: Left Pain Descriptors / Indicators: Aching Pain Onset: On-going Patients Stated Pain Goal: 0 Pain Intervention(s): Medication (See eMAR) Home Living/Prior Functioning Home Living Available Help at Discharge: Family, Available 24 hours/day Type of Home: House Home Access: Ramped entrance Home Layout: Two level, Able to live on main level with bedroom/bathroom Bathroom Shower/Tub: Health Visitor: Handicapped height Bathroom Accessibility: Yes Additional Comments: Patient reports planning to remodel shower to level entry- discussed types of roll in shower chairs  Lives With: Spouse IADL History Homemaking Responsibilities: No Current License: No Occupation:  Retired Prior Function Level of Independence: Needs assistance with ADLs Bath: Minimal Toileting: Minimal Dressing: Minimal  Able to Take Stairs?: No Driving: No Vocation: Retired Administrator, Sports Baseline Vision/History: 1 Wears glasses Ability to See in Adequate Light: 0 Adequate Patient Visual Report: No change from baseline Vision Assessment?: No apparent visual deficits Perception  Perception: Within Functional Limits Praxis Praxis: WFL Cognition Cognition Overall Cognitive Status: Impaired/Different from baseline Arousal/Alertness: Awake/alert Orientation Level: Person;Place Memory: Impaired Memory Impairment: Retrieval deficit Attention: Selective Selective Attention: Appears intact Awareness: Appears intact Problem Solving: Impaired Problem Solving Impairment: Verbal complex;Functional complex Safety/Judgment: Appears intact Comments: Patient reports feeling more clear cognitively, but still does note deficits from baseline Brief Interview for Mental Status (BIMS) Repetition of Three Words (First Attempt): 2 Temporal Orientation: Year: Correct Temporal Orientation: Month: Accurate within 5 days Temporal Orientation: Day: Correct Recall: Sock: Yes, no cue required Recall: Blue: Yes, no cue required Recall: Bed: Yes, after cueing (a piece of furniture) BIMS Summary Score: 13 Sensation Sensation Light Touch: Impaired by gross assessment Hot/Cold: Appears Intact Proprioception: Appears Intact Stereognosis: Impaired by gross assessment Additional Comments: Neuropathy in B hands, reports frequently dropping items and has trouble manipulating small items at baseline Coordination Gross Motor Movements are Fluid and Coordinated: No Fine Motor Movements are Fluid and Coordinated: No Motor  Motor Motor: Abnormal postural alignment and control  Trunk/Postural Assessment  Cervical Assessment Cervical Assessment: Exceptions to [facility] Postural Control Postural Control:  Deficits on evaluation Trunk Control: patient with balance deficits with no LE support Righting Reactions: slow UE protective response  Balance Balance Balance Assessed: Yes Static  Sitting Balance Static Sitting - Balance Support: Bilateral upper extremity supported Static Sitting - Level of Assistance: 4: Min assist Dynamic Sitting Balance Sitting balance - Comments: patient needing Min assist with balance sitting on air mattress. Expect improved static seated balance with firm surface. Extremity/Trunk Assessment RUE Assessment RUE Assessment: Within Functional Limits General Strength Comments: 4+/5 LUE Assessment LUE Assessment: Within Functional Limits General Strength Comments: 4+/5  Care Tool Care Tool Self Care Eating   Eating Assist Level: Dependent - Patient 0%    Oral Care    Oral Care Assist Level: Set up assist    Bathing   Body parts bathed by patient: Right arm;Left arm;Chest;Abdomen;Face;Front perineal area Body parts bathed by helper: Buttocks;Right upper leg;Left upper leg;Right lower leg Body parts n/a: Left lower leg Assist Level: Maximal Assistance - Patient 24 - 49%    Upper Body Dressing(including orthotics)   What is the patient wearing?: Hospital gown only   Assist Level: Minimal Assistance - Patient > 75%    Lower Body Dressing (excluding footwear)   What is the patient wearing?: Hospital gown only Assist for lower body dressing: Dependent - Patient 0%    Putting on/Taking off footwear Putting on/taking off footwear activity did not occur: N/A           Care Tool Toileting Toileting activity   Assist for toileting: Dependent - Patient 0%     Care Tool Bed Mobility Roll left and right activity   Roll left and right assist level: Minimal Assistance - Patient > 75%    Sit to lying activity   Sit to lying assist level: Moderate Assistance - Patient 50 - 74%    Lying to sitting on side of bed activity   Lying to sitting on side of bed  assist level: the ability to move from lying on the back to sitting on the side of the bed with no back support.: Moderate Assistance - Patient 50 - 74%     Care Tool Transfers Sit to stand transfer Sit to stand activity did not occur: N/A      Chair/bed transfer Chair/bed transfer activity did not occur: Safety/medical concerns       Toilet transfer Toilet transfer activity did not occur: Safety/medical concerns       Care Tool Cognition  Expression of Ideas and Wants Expression of Ideas and Wants: 4. Without difficulty (complex and basic) - expresses complex messages without difficulty and with speech that is clear and easy to understand  Understanding Verbal and Non-Verbal Content Understanding Verbal and Non-Verbal Content: 4. Understands (complex and basic) - clear comprehension without cues or repetitions   Memory/Recall Ability     Refer to Care Plan for Long Term Goals  SHORT TERM GOAL WEEK 1 OT Short Term Goal 1 (Week 1): Patient to perform bed level LE dressing with Mod assist OT Short Term Goal 2 (Week 1): Patient to perform [facility] transfer with Max of one OT Short Term Goal 3 (Week 1): Patient to perform bathing at bed level with Min assist  Recommendations for other services: Therapeutic Recreation  Stress management   Skilled Therapeutic Intervention ADL ADL Eating: NPO Grooming: Setup Where Assessed-Grooming: Bed level Upper Body Bathing: Setup Where Assessed-Upper Body Bathing: Bed level Lower Body Bathing: Maximal assistance Where Assessed-Lower Body Bathing: Bed level Upper Body Dressing: Minimal assistance Where Assessed-Upper Body Dressing: Bed level Lower Body Dressing: Dependent Where Assessed-Lower Body Dressing: Bed level Toileting: Dependent Where Assessed-Toileting: Bed level Toilet Transfer: Unable  to assess Toilet Transfer Method: Scientist, Research (life Sciences): Drop arm bedside commode Tub/Shower Transfer: Unable to assess [place] [employer] Transfer: Unable to assess ADL Comments: ADL's performed at bed level due to patient's decreased endurance and BLE amputation. Mobility  Bed Mobility Bed Mobility: Rolling Left;Left Sidelying to Sit Rolling Left: Contact Guard/Touching assist Left Sidelying to Sit: Moderate Assistance - Patient 50-74%   Discharge Criteria: Patient will be discharged from OT if patient refuses treatment 3 consecutive times without medical reason, if treatment goals not met, if there is a change in medical status, if patient makes no progress towards goals or if patient is discharged from hospital.  The above assessment, treatment plan, treatment alternatives and goals were discussed and mutually agreed upon: by patient  Isaiah JONETTA Freund 12/26/2024, 12:52 PM

## 2024-12-26 NOTE — Evaluation (Addendum)
 Speech Language Pathology Assessment and Plan  Patient Details  Name: Shane [name] MRN: [id number] Date of Birth: [birth year]  SLP Diagnosis: Cognitive Impairments;Dysphagia  Rehab Potential: Good ELOS: TBD    Today's Date: 12/26/2024 SLP Individual Time: 9154-9054 SLP Individual Time Calculation (min): 60 min   Hospital Problem: Principal Problem:   Left below-knee amputee [facility])  Past Medical History:  Past Medical History:  Diagnosis Date   Acute blood loss anemia    Anxiety    AV block, Mobitz 1    Cataract    Chronic kidney disease    d/t DM   CKD (chronic kidney disease), stage III (HCC)    COVID-19 virus infection 07/18/2020   Last Assessment & Plan:   Formatting of this note might be different from the original.  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 10/2019 Echo: EF 55-60%, Gr2 DD. Nl RV.    Neuromuscular disorder (HCC)    Neuropathy associated with endocrine disorder    Nonobstructive CAD (coronary artery disease)    a. 2012 Cath: mod, nonobs dzs; b. 10/2016 MV: EF 60%, no ischemia.   OSA on CPAP 05/05/2017   Unattended Home Sleep Test 7/2/813-AHI 38.6/hour, desaturation to 64%, body weight 261 pounds   PONV (postoperative nausea and vomiting)    Postoperative anemia due to acute blood loss 11/07/2020   Postoperative hemorrhagic shock 03/20/2021   Sleep apnea     uses cpap asked to bring mask and tubing   Past Surgical History:  Past Surgical History:  Procedure Laterality Date   ABDOMINAL  AORTOGRAM W/LOWER EXTREMITY N/A 12/10/2020   Procedure: ABDOMINAL AORTOGRAM W/LOWER EXTREMITY;  Surgeon: Magda Debby SAILOR, MD;  Location: [facility] INVASIVE CV LAB;  Service: Cardiovascular;  Laterality: N/A;   AMPUTATION Right 01/22/2021   Procedure: 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;  Surgeon: Harden Jerona GAILS, MD;  Location: [facility] OR;  Service: Orthopedics;  Laterality: Right;   AMPUTATION Right 03/17/2021   Procedure: RIGHT BELOW KNEE AMPUTATION;  Surgeon: Harden Jerona GAILS, MD;  Location: [facility] OR;  Service: Orthopedics;  Laterality: Right;   AMPUTATION Left 12/11/2024   Procedure: AMPUTATION BELOW KNEE;  Surgeon: Harden Jerona GAILS, MD;  Location: [facility] OR;  Service: Orthopedics;  Laterality: Left;   ANKLE FUSION Right 01/22/2021   Procedure: RIGHT FOOT TIBIOCALCANEAL FUSION;  Surgeon: Harden Jerona GAILS, MD;  Location: [facility] OR;  Service: Orthopedics;  Laterality: Right;   ANTERIOR FUSION CERVICAL SPINE  2012   APPLICATION OF WOUND VAC Left 12/11/2024   Procedure: APPLICATION, WOUND VAC;  Surgeon: Harden Jerona GAILS, MD;  Location: [facility] OR;  Service: Orthopedics;  Laterality: Left;   CARDIAC CATHETERIZATION  2011   CARDIAC CATHETERIZATION N/A 11/09/2016   Procedure: Right Heart Cath;  Surgeon: Victory LELON Sharps, MD;  Location: [facility] INVASIVE CV LAB;  Service: Cardiovascular;  Laterality: N/A;   carpel tunnel     left wrist   CATARACT EXTRACTION     CATARACT EXTRACTION W/  INTRAOCULAR LENS  IMPLANT, BILATERAL  2013   CERVICAL LAMINECTOMY  2012   COLONOSCOPY N/A 01/14/2013   Procedure: COLONOSCOPY;  Surgeon: Norleen LOISE Kiang, MD;  Location: [facility] ENDOSCOPY;  Service: Endoscopy;  Laterality: N/A;   CORONARY ARTERY BYPASS GRAFT  11/04/2020   LIMA-LAD, SVG-OM1, SVG-PDA (Dr Norleen Exon [facility]) dc 11/18/2020   EYE SURGERY     I & D EXTREMITY Right 02/19/2021   Procedure: RIGHT ANKLE DEBRIDEMENT AND PLACEMENT ANTIBIOTIC BEADS;  Surgeon: Harden Jerona GAILS, MD;  Location: [facility] OR;  Service: Orthopedics;  Laterality: Right;   KNEE SURGERY  1998   left   LEFT HEART CATH  AND CORONARY ANGIOGRAPHY N/A 07/10/2020   Procedure: LEFT HEART CATH AND CORONARY ANGIOGRAPHY;  Surgeon: Wonda Sharper, MD;  Location: [facility] INVASIVE CV LAB;  Service: Cardiovascular;  Laterality: N/A;   LUMBAR LAMINECTOMY  2003   LUNG BIOPSY Left 12/26/2016   Procedure: LUNG BIOPSY;  Surgeon: Elspeth JAYSON Millers, MD;  Location: [facility] OR;  Service: Thoracic;  Laterality: Left;   PACEMAKER IMPLANT N/A 03/30/2020   Procedure: PACEMAKER IMPLANT;  Surgeon: Waddell Danelle ORN, MD;  Location: [facility] INVASIVE CV LAB;  Service: Cardiovascular;  Laterality: N/A;   PERIPHERAL VASCULAR INTERVENTION Right 12/10/2020   Procedure: PERIPHERAL VASCULAR INTERVENTION;  Surgeon: Magda Debby LOISE, MD;  Location: [facility] INVASIVE CV LAB;  Service: Cardiovascular;  Laterality: Right;  SFA   POSTERIOR FUSION CERVICAL SPINE  2012   PPM GENERATOR REMOVAL N/A 12/14/2020   Procedure: PPM GENERATOR REMOVAL;  Surgeon: Waddell Danelle ORN, MD;  Location: [facility] INVASIVE CV LAB;  Service: Cardiovascular;  Laterality: N/A;   TEE WITHOUT CARDIOVERSION N/A 12/11/2020   Procedure: TRANSESOPHAGEAL ECHOCARDIOGRAM (TEE);  Surgeon: Barbaraann Darryle Debby, MD;  Location: [facility] ENDOSCOPY;  Service: Cardiovascular;  Laterality: N/A;   TRIGGER FINGER RELEASE  2011   4th finger left hand   VIDEO ASSISTED THORACOSCOPY Left 12/26/2016   Procedure: VIDEO ASSISTED THORACOSCOPY;  Surgeon: Elspeth JAYSON Millers, MD;  Location: [facility] OR;  Service: Thoracic;  Laterality: Left;   VIDEO BRONCHOSCOPY N/A 12/26/2016   Procedure: VIDEO BRONCHOSCOPY;  Surgeon: Elspeth JAYSON Millers, MD;  Location: [facility] OR;  Service: Thoracic;  Laterality: N/A;    Assessment / Plan / Recommendation Clinical Impression Pt is an 84 year old right-handed male with history significant for chronic anemia, anxiety/depression, AV block/Mobitz 1, moderate aortic stenosis/PPM 2022, 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 Laneta Arzella Holland of [facility], CKD stage III, diabetes mellitus, GERD, hyperlipidemia, hypertension, idiopathic pulmonary fibrosis/OSA on CPAP followed by Dr. 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 Per chart review patient lives with spouse and grandson. Two-level home bed and bath main level with ramped entrance. At baseline, he is modified independent with the use of a scooter in the home and community, and transfers independently with intermittent use of right lower extremity prosthesis to assist. Presented 12/11/2024 with gangrenous ischemic changes to the left foot with reported rest pain. Limb was not felt to be salvageable undergoing left BKA 12/11/2024 per Dr. Harden with application of wound VAC. Postoperative 1/8 he became hypotensive and subsequently transferred to the ICU, required pressor support which had been since weaned off to midodrine  5 mg every 8 hours as well as placed on a course of linezolid /Zosyn  for suspect sepsis and  completing course. Echocardiogram with ejection fraction of 30 to 35% grade 3 diastolic dysfunction. Hospitalization was complicated by AKI on CKD stage III requiring CRRT 1/10 - 1/12 with follow-up nephrology services transitioned into intermittent hemodialysis . Initially with Foley catheter tube urine output has picked up and voiding via external catheter. Renal ultrasound 12/15/2024 showed no hydronephrosis and latest creatinine 2.73. Patient with intermittent bouts of agitation and restlessness suspect encephalopathy/ICU delirium that has steadily improved as patient attends therapies. Acute on chronic anemia latest hemoglobin 8.9. Patient was cleared to resume chronic Eliquis  for history of atrial fibrillation. Speech therapy consulted for oropharyngeal dysphagia/moderate protein  calorie malnutrition a Cortrak tube was placed currently remains n.p.o. patient did have profuse stool output presumably from tube feeds and a Flexi-Seal was inserted. Therapy evaluations completed due to patient's decreased functional mobility was admitted for a comprehensive rehab program. Patient is currently receiving HD.   Cognitive-Linguistic: Pt presented w/ only mild cognitive deficits in the areas of problem solving and STM. His overall cognitive function is notably improved compared to acute care, however, anticipate that he is not 100% back to baseline at this time. No concerns re expressive/receptive language or motor speech production. Of note, pt noted to become nauseous, dizzy, and hot towards the end of eval tasks. Vitals were checked and BP was slightly low @ 101/52. Nursing aware and assisted to reposition pt upright. Additionally, direct hand off completed w/ OT at the end of tx tasks.   He would benefit from continued ST services to target mild cognitive deficits remaining, maximize pt independence, and facilitate return to prev roles/responsibilities.   Bedside Swallow: Unable to complete any PO trials d/t severity of pt nausea. According to prev documentation from acute care, he presents w/ notably improved attention/alertness, awareness, and problem solving. Anticipate his oropharyngeal clearance is subsequently improved. Will plan for MBSS 12/27/24 to determine appropriateness of oral diet.      Skilled Therapeutic Interventions          SLP facilitated a cognitive-linguistic evaluationo assess pt's cognitive-communication skills and determine need for additional skilled ST services. See above for more information.    SLP Assessment  Patient will need skilled Speech Lanaguage Pathology Services during CIR admission    Recommendations  SLP Diet Recommendations: NPO Medication Administration: Via alternative means Oral Care Recommendations: Oral care BID Patient destination:  Home Follow up Recommendations: Outpatient SLP;Home Health SLP Equipment Recommended: None recommended by SLP    SLP Frequency 3 to 5 out of 7 days   SLP Duration  SLP Intensity  SLP Treatment/Interventions TBD  Minumum of 1-2 x/day, 30 to 90 minutes  Cognitive remediation/compensation;Therapeutic Activities;Cueing hierarchy;Functional tasks;Oral motor exercises;Dysphagia/aspiration precaution training;Patient/family education    Pain Pain Assessment Pain Scale: 0-10 Pain Score: 4  Pain Type: Surgical pain Pain Location: Leg Pain Orientation: Left Pain Descriptors / Indicators: Aching Pain Onset: On-going Patients Stated Pain Goal: 0 Pain Intervention(s): Medication (See eMAR)  Prior Functioning Type of Home: House  Lives With: Spouse Available Help at Discharge: Family;Available 24 hours/day Vocation: Retired  Architectural Technologist Overall Cognitive Status: Impaired/Different from baseline Arousal/Alertness: Awake/alert Orientation Level: Oriented X4 Year: 2026 Month: January Day of Week: Incorrect Attention: Selective Selective Attention: Appears intact Memory: Impaired Memory Impairment: Retrieval deficit Awareness: Appears intact Problem Solving: Impaired Problem Solving Impairment: Verbal complex;Functional complex Safety/Judgment: Appears intact Comments: Patient reports feeling more clear cognitively, but still does note deficits from baseline  Comprehension Auditory Comprehension Overall Auditory Comprehension: Appears within functional limits for  tasks assessed Expression Expression Primary Mode of Expression: Verbal Verbal Expression Overall Verbal Expression: Appears within functional limits for tasks assessed Written Expression Dominant Hand: Right Oral Motor Oral Motor/Sensory Function Overall Oral Motor/Sensory Function: Within functional limits Motor Speech Overall Motor Speech: Appears within functional limits for tasks  assessed  Care Tool Care Tool Cognition Ability to hear (with hearing aid or hearing appliances if normally used Ability to hear (with hearing aid or hearing appliances if normally used): 0. Adequate - no difficulty in normal conservation, social interaction, listening to TV   Expression of Ideas and Wants Expression of Ideas and Wants: 4. Without difficulty (complex and basic) - expresses complex messages without difficulty and with speech that is clear and easy to understand   Understanding Verbal and Non-Verbal Content Understanding Verbal and Non-Verbal Content: 4. Understands (complex and basic) - clear comprehension without cues or repetitions  Memory/Recall Ability Memory/Recall Ability : Current season;That he or she is in a hospital/hospital unit   Motor Speech Assessment  WFL  Bedside Swallowing Assessment See clinical impression  Short Term Goals: Week 1: SLP Short Term Goal 1 (Week 1): Pt will participate in MBSS to identify least restrictive diet SLP Short Term Goal 2 (Week 1): Pt will solve mildly complex problems w/ supervisionA SLP Short Term Goal 3 (Week 1): Pt will recall recent/relevant information w/ supervisionA  Refer to Care Plan for Long Term Goals  Recommendations for other services: None   Discharge Criteria: Patient will be discharged from SLP if patient refuses treatment 3 consecutive times without medical reason, if treatment goals not met, if there is a change in medical status, if patient makes no progress towards goals or if patient is discharged from hospital.  The above assessment, treatment plan, treatment alternatives and goals were discussed and mutually agreed upon: by patient  Recardo DELENA Mole 12/26/2024, 12:55 PM

## 2024-12-26 NOTE — Discharge Instructions (Addendum)
 Inpatient Rehab Discharge Instructions  Shane [name] [facility] Discharge date and time: No discharge date for patient encounter.   Activities/Precautions/ Functional Status: Activity: activity as tolerated Diet: renal diet Wound Care: Routine skin checks Functional status:  ___ No restrictions     ___ Walk up steps independently ___ 24/7 supervision/assistance   ___ Walk up steps with assistance ___ Intermittent supervision/assistance  ___ Bathe/dress independently ___ Walk with walker     _x__ Bathe/dress with assistance ___ Walk Independently    ___ Shower independently ___ Walk with assistance    ___ Shower with assistance ___ No alcohol     ___ Return to work/school ________  Special Instructions: No driving smoking or alcohol  Follow-up renal services in regards to hemodialysis  COMMUNITY REFERRALS UPON DISCHARGE:    Home Health:   PT     OT                    Agency: [facility] Phone: [phone]   My questions have been answered and I understand these instructions. I will adhere to these goals and the provided educational materials after my discharge from the hospital.  Patient/Caregiver Signature _______________________________ Date __________  Clinician Signature _______________________________________ Date __________  Please bring this form and your medication list with you to all your follow-up doctor's appointments.

## 2024-12-26 NOTE — Evaluation (Signed)
 Physical Therapy Assessment and Plan  Patient Details  Name: Shane [name] MRN: [id number] Date of Birth: Dec 13, 1940  PT Diagnosis: Abnormal posture, Impaired sensation, Muscle weakness, and Pain in L residual limb Rehab Potential: Good ELOS: 2-3 weeks   Today's Date: 12/26/2024 PT Individual Time: 1300-1415 PT Individual Time Calculation (min): 75 min    [name] Problem: Principal Problem:   Left below-knee amputee Shane [name])   Past Medical History:  Past Medical History:  Diagnosis Date   Acute blood loss anemia    Anxiety    AV block, Mobitz 1    Cataract    Chronic kidney disease    d/t DM   CKD (chronic kidney disease), stage III (HCC)    COVID-19 virus infection 07/18/2020   Last Assessment & Plan:   Formatting of this note might be different from the original.  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 10/2019 Echo: EF 55-60%, Gr2 DD. Nl RV.    Neuromuscular disorder (HCC)    Neuropathy associated with endocrine disorder    Nonobstructive CAD (coronary artery disease)    a. 2012 Cath: mod, nonobs dzs; b. 10/2016 MV: EF 60%, no ischemia.   OSA on CPAP 05/05/2017   Unattended Home Sleep Test 7/2/813-AHI 38.6/hour, desaturation to 64%, body weight 261 pounds   PONV (postoperative nausea and vomiting)    Postoperative anemia due to acute blood loss 11/07/2020   Postoperative hemorrhagic shock 03/20/2021   Sleep apnea     uses cpap asked to bring mask and tubing   Past Surgical History:  Past Surgical History:   Procedure Laterality Date   ABDOMINAL AORTOGRAM W/LOWER EXTREMITY N/A 12/10/2020   Procedure: ABDOMINAL AORTOGRAM W/LOWER EXTREMITY;  Surgeon: Magda Debby SAILOR, MD;  Location: [facility] INVASIVE CV LAB;  Service: Cardiovascular;  Laterality: N/A;   AMPUTATION Right 01/22/2021   Procedure: 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;  Surgeon: Harden Jerona GAILS, MD;  Location: [facility] OR;  Service: Orthopedics;  Laterality: Right;   AMPUTATION Right 03/17/2021   Procedure: RIGHT BELOW KNEE AMPUTATION;  Surgeon: Harden Jerona GAILS, MD;  Location: [name];  Service: Orthopedics;  Laterality: Right;   AMPUTATION Left 12/11/2024   Procedure: AMPUTATION BELOW KNEE;  Surgeon: Harden Jerona GAILS, MD;  Location: [name] OR;  Service: Orthopedics;  Laterality: Left;   ANKLE FUSION Right 01/22/2021   Procedure: RIGHT FOOT TIBIOCALCANEAL FUSION;  Surgeon: Harden Jerona GAILS, MD;  Location: [facility] OR;  Service: Orthopedics;  Laterality: Right;   ANTERIOR FUSION CERVICAL SPINE  2012   APPLICATION OF WOUND VAC Left 12/11/2024   Procedure: APPLICATION, WOUND VAC;  Surgeon: Harden Jerona GAILS, MD;  Location: [facility] OR;  Service: Orthopedics;  Laterality: Left;   CARDIAC CATHETERIZATION  2011   CARDIAC CATHETERIZATION N/A 11/09/2016   Procedure: Right Heart Cath;  Surgeon: Victory LELON Sharps, MD;  Location: [facility] INVASIVE CV LAB;  Service: Cardiovascular;  Laterality: N/A;   carpel tunnel     left wrist  CATARACT EXTRACTION     CATARACT EXTRACTION W/ INTRAOCULAR LENS  IMPLANT, BILATERAL  2013   CERVICAL LAMINECTOMY  2012   COLONOSCOPY N/A 01/14/2013   Procedure: COLONOSCOPY;  Surgeon: Norleen LOISE Kiang, MD;  Location: [facility] ENDOSCOPY;  Service: Endoscopy;  Laterality: N/A;   CORONARY ARTERY BYPASS GRAFT  11/04/2020   LIMA-LAD, SVG-OM1, SVG-PDA (Dr Norleen Exon [place] [name]) dc 11/18/2020   EYE SURGERY     I & D EXTREMITY Right 02/19/2021   Procedure: RIGHT ANKLE DEBRIDEMENT AND PLACEMENT ANTIBIOTIC BEADS;  Surgeon: Harden Jerona GAILS, MD;  Location: [facility] OR;  Service: Orthopedics;  Laterality: Right;   KNEE  SURGERY  1998   left   LEFT HEART CATH AND CORONARY ANGIOGRAPHY N/A 07/10/2020   Procedure: LEFT HEART CATH AND CORONARY ANGIOGRAPHY;  Surgeon: Wonda Sharper, MD;  Location: [place] [name];  Service: Cardiovascular;  Laterality: N/A;   LUMBAR LAMINECTOMY  2003   LUNG BIOPSY Left 12/26/2016   Procedure: LUNG BIOPSY;  Surgeon: Elspeth JAYSON Millers, MD;  Location: [name] OR;  Service: Thoracic;  Laterality: Left;   PACEMAKER IMPLANT N/A 03/30/2020   Procedure: PACEMAKER IMPLANT;  Surgeon: Waddell Danelle ORN, MD;  Location: [facility];  Service: Cardiovascular;  Laterality: N/A;   PERIPHERAL VASCULAR INTERVENTION Right 12/10/2020   Procedure: PERIPHERAL VASCULAR INTERVENTION;  Surgeon: Magda Debby LOISE, MD;  Location: [facility];  Service: Cardiovascular;  Laterality: Right;  SFA   POSTERIOR FUSION CERVICAL SPINE  2012   PPM GENERATOR REMOVAL N/A 12/14/2020   Procedure: PPM GENERATOR REMOVAL;  Surgeon: Waddell Danelle ORN, MD;  Location: [facility];  Service: Cardiovascular;  Laterality: N/A;   TEE WITHOUT CARDIOVERSION N/A 12/11/2020   Procedure: TRANSESOPHAGEAL ECHOCARDIOGRAM (TEE);  Surgeon: Barbaraann Darryle Debby, MD;  Location: [facility] ENDOSCOPY;  Service: Cardiovascular;  Laterality: N/A;   TRIGGER FINGER RELEASE  2011   4th finger left hand   VIDEO ASSISTED THORACOSCOPY Left 12/26/2016   Procedure: VIDEO ASSISTED THORACOSCOPY;  Surgeon: Elspeth JAYSON Millers, MD;  Location: [place] [name] OR;  Service: Thoracic;  Laterality: Left;   VIDEO BRONCHOSCOPY N/A 12/26/2016   Procedure: VIDEO BRONCHOSCOPY;  Surgeon: Elspeth JAYSON Millers, MD;  Location: [name] OR;  Service: Thoracic;  Laterality: N/A;    Assessment & Plan Clinical Impression: Patient is a 84 y.o. 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 Laneta Arzella Holland of [facility], CKD stage III, diabetes mellitus, GERD, hyperlipidemia, hypertension, idiopathic pulmonary fibrosis/OSA on CPAP followed by Dr. 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 Per chart review patient lives with spouse and grandson. Two-level home bed and bath main level with ramped entrance. At baseline, he is modified independent with the use of a scooter in the home and community, and transfers independently with intermittent use of right lower extremity prosthesis to assist. Presented 12/11/2024 with gangrenous ischemic changes to the left foot with reported rest pain. Limb was not felt to be salvageable undergoing left BKA 12/11/2024 per Dr. Harden with application of wound VAC. Postoperative 1/8 he became hypotensive and subsequently transferred to the ICU, required pressor support which had been since weaned off to midodrine  5 mg every 8 hours as well as placed  on a course of linezolid /Zosyn  for suspect sepsis and completing course. Echocardiogram with ejection fraction of 30 to 35% grade 3 diastolic dysfunction. Hospitalization was complicated by AKI on CKD stage III requiring CRRT 1/10 - 1/12 with follow-up nephrology services transitioned into intermittent hemodialysis . Initially with Foley catheter tube urine output has picked up and voiding via external catheter. Renal ultrasound 12/15/2024 showed no hydronephrosis and latest creatinine 2.73. Patient with intermittent bouts of agitation and restlessness suspect encephalopathy/ICU delirium that has steadily improved as patient attends therapies. Acute on chronic anemia latest hemoglobin 8.9. Patient was cleared to resume chronic Eliquis  for history of atrial fibrillation. Speech therapy consulted for oropharyngeal  dysphagia/moderate protein calorie malnutrition a Cortrak tube was placed currently remains n.p.o. patient did have profuse stool output presumably from tube feeds and a Flexi-Seal was inserted. Therapy evaluations completed due to patient's decreased functional mobility was admitted for a comprehensive rehab program. Patient is currently receiving HD.  Patient transferred to CIR on 12/25/2024 .   Patient currently requires total with mobility secondary to muscle weakness and muscle joint tightness, decreased cardiorespiratoy endurance, and decreased sitting balance, decreased postural control, decreased balance strategies, and difficulty maintaining precautions.  Prior to hospitalization, patient was modified independent  with mobility and lived with Spouse, Other (Comment) (63 year old grandson) in a House home.  Home access is  Ramped entrance.  Patient will benefit from skilled PT intervention to maximize safe functional mobility, minimize fall risk, and decrease caregiver burden for planned discharge home with 24 hour assist.  Anticipate patient will benefit from follow up [name] at discharge.  PT - End of Session Activity Tolerance: Tolerates 10 - 20 min activity with multiple rests Endurance Deficit: Yes PT Assessment Rehab Potential (ACUTE/IP ONLY): Good PT Barriers to Discharge: Incontinence;Wound Care;New oxygen;Weight bearing restrictions PT Patient demonstrates impairments in the following area(s): Balance;Edema;Endurance;Motor;Nutrition;Pain;Safety;Sensory;Skin Integrity PT Transfers Functional Problem(s): Bed Mobility;Bed to Chair;Car;Furniture PT Locomotion Functional Problem(s): Wheelchair Mobility PT Plan PT Intensity: Minimum of 1-2 x/day ,45 to 90 minutes PT Frequency: 5 out of 7 days PT Duration Estimated Length of Stay: 2-3 weeks PT Treatment/Interventions: Ambulation/gait training;Discharge planning;Functional mobility training;Psychosocial support;Therapeutic  Activities;Visual/perceptual remediation/compensation;Wheelchair propulsion/positioning;Therapeutic Exercise;Skin care/wound management;Neuromuscular re-education;Disease management/prevention;Balance/vestibular training;Cognitive remediation/compensation;DME/adaptive equipment instruction;Pain management;Splinting/orthotics;UE/LE Strength taining/ROM;UE/LE Coordination activities;Stair training;Patient/family education;Functional electrical stimulation;Community reintegration PT Transfers Anticipated Outcome(s): Min A PT Locomotion Anticipated Outcome(s): S at [name] level PT Recommendation Recommendations for Other Services: Neuropsych consult Follow Up Recommendations: Home health PT Patient destination: Home Equipment Recommended: To be determined   PT Evaluation Precautions/Restrictions Precautions Precautions: Fall Recall of Precautions/Restrictions: Intact Precaution/Restrictions Comments: watch BP wound VAC, LLE splint Restrictions Weight Bearing Restrictions Per Provider Order: Yes LLE Weight Bearing Per Provider Order: Non weight bearing Pain Interference Pain Interference Pain Effect on Sleep: 4. Almost constantly Pain Interference with Therapy Activities: 1. Rarely or not at all Pain Interference with Day-to-Day Activities: 1. Rarely or not at all Home Living/Prior Functioning Home Living Available Help at Discharge: Family;Available 24 hours/day Type of Home: House Home Access: Ramped entrance Home Layout: Two level;Able to live on main level with bedroom/bathroom Bathroom Shower/Tub: Health Visitor: Handicapped height Bathroom Accessibility: Yes  Lives With: Spouse;Other (Comment) (91 year old grandson) Prior Function Level of Independence: Requires assistive device for independence;Independent with transfers (electric scooter; has a R prosthesis that he used occasionally for transfers but not for amb)  Able to Take Stairs?: No Driving: No Vocation:  Retired Optometrist - History Ability to See in Adequate Light:  0 Adequate Perception Perception: Within Functional Limits Praxis Praxis: WFL  Cognition Overall Cognitive Status: Impaired/Different from baseline Arousal/Alertness: Awake/alert Orientation Level: Oriented X4 Year: 2026 Month: January Day of Week: Incorrect Attention: Selective Selective Attention: Appears intact Memory: Impaired Memory Impairment: Retrieval deficit Awareness: Appears intact Problem Solving: Impaired Problem Solving Impairment: Verbal complex;Functional complex Safety/Judgment: Appears intact Comments: Patient reports feeling more clear cognitively, but still does note deficits from baseline Sensation Sensation Light Touch: Impaired by gross assessment Hot/Cold: Appears Intact Proprioception: Appears Intact Stereognosis: Impaired by gross assessment Additional Comments: Neuropathy in B hands, impaired LT sensation B LE Coordination Gross Motor Movements are Fluid and Coordinated: No Fine Motor Movements are Fluid and Coordinated: No Motor  Motor Motor: Abnormal postural alignment and control Motor - Skilled Clinical Observations: Generalized weakness   Trunk/Postural Assessment  Cervical Assessment Cervical Assessment: Exceptions to [name] (forward head) Thoracic Assessment Thoracic Assessment: Exceptions to [facility] (rounded shoulders) Lumbar Assessment Lumbar Assessment: Exceptions to [name] (posterior pelvic tilt) Postural Control Postural Control: Deficits on evaluation Trunk Control: patient with balance deficits with no LE support Righting Reactions: slow UE protective response  Balance Balance Balance Assessed: Yes Static Sitting Balance Static Sitting - Balance Support: Bilateral upper extremity supported Static Sitting - Level of Assistance: 4: Min assist Dynamic Sitting Balance Sitting balance - Comments: patient needing Min assist with balance sitting on air  mattress. Expect improved static seated balance with firm surface. Extremity Assessment  RUE Assessment RUE Assessment: Within Functional Limits General Strength Comments: 4+/5 LUE Assessment LUE Assessment: Within Functional Limits General Strength Comments: 4+/5 RLE Assessment RLE Assessment: Exceptions to [facility] General Strength Comments: Hip/knee grossly 3+/5 LLE Assessment General Strength Comments: Hip/knee grossly 2+/5  Care Tool Care Tool Bed Mobility Roll left and right activity   Roll left and right assist level: Minimal Assistance - Patient > 75%    Sit to lying activity   Sit to lying assist level: Maximal Assistance - Patient 25 - 49%    Lying to sitting on side of bed activity   Lying to sitting on side of bed assist level: the ability to move from lying on the back to sitting on the side of the bed with no back support.: Maximal Assistance - Patient 25 - 49%     Care Tool Transfers Sit to stand transfer Sit to stand activity did not occur: Safety/medical concerns (B BKA)      Chair/bed transfer Chair/bed transfer activity did not occur: Safety/medical concerns (weakness/fatigue/pain/nausea)      Licensed Conveyancer transfer activity did not occur: Safety/medical concerns (weakness/fatigue/pain/nausea)        Care Tool Locomotion Ambulation Ambulation activity did not occur: Safety/medical concerns (B BKA)        Walk 10 feet activity Walk 10 feet activity did not occur: Safety/medical concerns (B BKA)       Walk 50 feet with 2 turns activity Walk 50 feet with 2 turns activity did not occur: Safety/medical concerns (B BKA)      Walk 150 feet activity Walk 150 feet activity did not occur: Safety/medical concerns (B BKA)      Walk 10 feet on uneven surfaces activity Walk 10 feet on uneven surfaces activity did not occur: Safety/medical concerns (B BKA)      Stairs Stair activity did not occur: Safety/medical concerns (B BKA)        Walk up/down 1 step  activity Walk up/down 1 step or curb (drop down) activity did not occur: Safety/medical concerns (B  BKA)      Walk up/down 4 steps activity Walk up/down 4 steps activity did not occur: Safety/medical concerns (B BKA)      Walk up/down 12 steps activity Walk up/down 12 steps activity did not occur: Safety/medical concerns (B BKA)      Pick up small objects from floor Pick up small object from the floor (from standing position) activity did not occur: Safety/medical concerns (B BKA)      Wheelchair Is the patient using a wheelchair?: Yes Type of Wheelchair: Manual Wheelchair activity did not occur: Safety/medical concerns (weakness/fatigue/pain/nausea)      Wheel 50 feet with 2 turns activity Wheelchair 50 feet with 2 turns activity did not occur: Safety/medical concerns (weakness/fatigue/pain/nausea)    Wheel 150 feet activity Wheelchair 150 feet activity did not occur: Safety/medical concerns (weakness/fatigue/pain/nausea)      Refer to Care Plan for Long Term Goals  SHORT TERM GOAL WEEK 1 PT Short Term Goal 1 (Week 1): Pt will perform supine to sit transfers with min A PT Short Term Goal 2 (Week 1): Pt will perform sit to supine transfers with min A PT Short Term Goal 3 (Week 1): Pt will maintain sitting balance EOB with CGA PT Short Term Goal 4 (Week 1): Pt will perform bed <> WC transfers with max A  Recommendations for other services: Neuropsych  Evaluation completed (see details above) with patient education regarding purpose of PT evaluation, PT POC and goals, therapy schedule, weekly team meetings, and other CIR information including safety plan and fall risk safety. Pt semi-reclined in bed upon arrival. Pt reports unrated pain level in L residual limb, premedicated, and agreeable to PT eval. Pt with significant fatigue and endurance limitations impacting eval. Pain interference, LT sensation, and MMTs performed in supine position. Pt performed rolling multiple times  throughout session. PT noticed soiled brief. Pt currently dependent for peri care. Pt dependent for donning/doffing pants. Multiple attempts required to achieve sitting EOB position due to fatigue and nausea. Pt's BP also dropped with transfer to EOB - 114/61 in supine and 96/59 in sitting. Pt required min A and B UE support on bed rails for maintain seated balance. At end of session, pt positioned for comfort, and all needs in reach. Pt performed the below functional mobility tasks with the specified levels of skilled cuing and assistance.  Skilled Therapeutic Intervention Mobility Bed Mobility Bed Mobility: Rolling Right;Rolling Left;Supine to Sit;Sit to Supine Rolling Right: Contact Guard/Touching assist Rolling Left: Contact Guard/Touching assist Left Sidelying to Sit: Moderate Assistance - Patient 50-74% Supine to Sit: Maximal Assistance - Patient - Patient 25-49% Sit to Supine: Maximal Assistance - Patient 25-49% Transfers Transfers: Lateral/Scoot Transfers Lateral/Scoot Transfers: 2 Helpers;Dependent - Patient 0% Locomotion  Gait Ambulation: No Gait Gait: No Stairs / Additional Locomotion Stairs: No Wheelchair Mobility Wheelchair Mobility: Yes Wheelchair Assistance: Dependent - Patient 0% Wheelchair Parts Management: Needs assistance   Discharge Criteria: Patient will be discharged from PT if patient refuses treatment 3 consecutive times without medical reason, if treatment goals not met, if there is a change in medical status, if patient makes no progress towards goals or if patient is discharged from [name].  The above assessment, treatment plan, treatment alternatives and goals were discussed and mutually agreed upon: by patient  Comer CHRISTELLA Levora Comer Levora, PT, DPT 12/26/2024, 4:37 PM

## 2024-12-26 NOTE — Plan of Care (Signed)
" °  Problem: RH Eating Goal: LTG Patient will perform eating w/assist, cues/equip (OT) Description: LTG: Patient will perform eating with assist, with/without cues using equipment (OT) Flowsheets (Taken 12/26/2024 1259) LTG: Pt will perform eating with assistance level of: Supervision/Verbal cueing LTG: Pt will perform eating using equipment: Built up handle   Problem: RH Grooming Goal: LTG Patient will perform grooming w/assist,cues/equip (OT) Description: LTG: Patient will perform grooming with assist, with/without cues using equipment (OT) Flowsheets (Taken 12/26/2024 1259) LTG: Pt will perform grooming with assistance level of: Set up assist    Problem: RH Bathing Goal: LTG Patient will bathe all body parts with assist levels (OT) Description: LTG: Patient will bathe all body parts with assist levels (OT) Flowsheets (Taken 12/26/2024 1259) LTG: Pt will perform bathing with assistance level/cueing: Contact Guard/Touching assist LTG: Position pt will perform bathing: At sink   Problem: RH Dressing Goal: LTG Patient will perform upper body dressing (OT) Description: LTG Patient will perform upper body dressing with assist, with/without cues (OT). Flowsheets (Taken 12/26/2024 1259) LTG: Pt will perform upper body dressing with assistance level of: Set up assist Goal: LTG Patient will perform lower body dressing w/assist (OT) Description: LTG: Patient will perform lower body dressing with assist, with/without cues in positioning using equipment (OT) Flowsheets (Taken 12/26/2024 1259) LTG: Pt will perform lower body dressing with assistance level of: Minimal Assistance - Patient > 75%   Problem: RH Toileting Goal: LTG Patient will perform toileting task (3/3 steps) with assistance level (OT) Description: LTG: Patient will perform toileting task (3/3 steps) with assistance level (OT)  Flowsheets (Taken 12/26/2024 1259) LTG: Pt will perform toileting task (3/3 steps) with assistance level:  Minimal Assistance - Patient > 75%   Problem: RH Functional Use of Upper Extremity Goal: LTG Patient will use RT/LT upper extremity as a (OT) Description: LTG: Patient will use right/left upper extremity as a stabilizer/gross assist/diminished/nondominant/dominant level with assist, with/without cues during functional activity (OT) Flowsheets (Taken 12/26/2024 1259) LTG: Use of upper extremity in functional activities: RUE as dominant level LTG: Pt will use upper extremity in functional activity with assistance level of: Independent Note: Patient will improve strength to 5/5 grossly   Problem: RH Toilet Transfers Goal: LTG Patient will perform toilet transfers w/assist (OT) Description: LTG: Patient will perform toilet transfers with assist, with/without cues using equipment (OT) Flowsheets (Taken 12/26/2024 1259) LTG: Pt will perform toilet transfers with assistance level of: Minimal Assistance - Patient > 75%   Problem: RH Tub/Shower Transfers Goal: LTG Patient will perform tub/shower transfers w/assist (OT) Description: LTG: Patient will perform tub/shower transfers with assist, with/without cues using equipment (OT) Flowsheets (Taken 12/26/2024 1259) LTG: Pt will perform tub/shower stall transfers with assistance level of: Minimal Assistance - Patient > 75% LTG: Pt will perform tub/shower transfers from: Walk in shower   "

## 2024-12-27 ENCOUNTER — Inpatient Hospital Stay (HOSPITAL_COMMUNITY)

## 2024-12-27 DIAGNOSIS — E875 Hyperkalemia: Secondary | ICD-10-CM

## 2024-12-27 LAB — RENAL FUNCTION PANEL
Albumin: 3.2 g/dL — ABNORMAL LOW (ref 3.5–5.0)
Anion gap: 10 (ref 5–15)
BUN: 83 mg/dL — ABNORMAL HIGH (ref 8–23)
CO2: 29 mmol/L (ref 22–32)
Calcium: 9.1 mg/dL (ref 8.9–10.3)
Chloride: 93 mmol/L — ABNORMAL LOW (ref 98–111)
Creatinine, Ser: 2.7 mg/dL — ABNORMAL HIGH (ref 0.61–1.24)
GFR, Estimated: 23 mL/min — ABNORMAL LOW
Glucose, Bld: 161 mg/dL — ABNORMAL HIGH (ref 70–99)
Phosphorus: 4.6 mg/dL (ref 2.5–4.6)
Potassium: 5.6 mmol/L — ABNORMAL HIGH (ref 3.5–5.1)
Sodium: 131 mmol/L — ABNORMAL LOW (ref 135–145)

## 2024-12-27 LAB — GLUCOSE, CAPILLARY
Glucose-Capillary: 169 mg/dL — ABNORMAL HIGH (ref 70–99)
Glucose-Capillary: 123 mg/dL — ABNORMAL HIGH (ref 70–99)
Glucose-Capillary: 116 mg/dL — ABNORMAL HIGH (ref 70–99)
Glucose-Capillary: 118 mg/dL — ABNORMAL HIGH (ref 70–99)
Glucose-Capillary: 152 mg/dL — ABNORMAL HIGH (ref 70–99)

## 2024-12-27 MED ORDER — ATORVASTATIN CALCIUM 80 MG PO TABS
80.0000 mg | ORAL_TABLET | Freq: Every day | ORAL | Status: DC
  Administered 2024-12-29 – 2025-01-01 (×4): 80 mg via ORAL
  Filled 2024-12-27 (×4): qty 1

## 2024-12-27 MED ORDER — DEXTROMETHORPHAN POLISTIREX ER 30 MG/5ML PO SUER: 15.0000 mg | Freq: Every day | ORAL | Status: DC | PRN

## 2024-12-27 MED ORDER — OXYCODONE HCL 5 MG PO TABS
2.5000 mg | ORAL_TABLET | ORAL | Status: DC | PRN
  Administered 2024-12-27 – 2025-01-09 (×35): 2.5 mg via ORAL
  Filled 2024-12-27 (×34): qty 1

## 2024-12-27 MED ORDER — METHOCARBAMOL 500 MG PO TABS
500.0000 mg | ORAL_TABLET | Freq: Three times a day (TID) | ORAL | Status: DC | PRN
  Administered 2024-12-27 – 2025-01-08 (×11): 500 mg via ORAL
  Filled 2024-12-27 (×12): qty 1

## 2024-12-27 MED ORDER — ADULT MULTIVITAMIN W/MINERALS CH
1.0000 | ORAL_TABLET | Freq: Every morning | ORAL | Status: DC
  Administered 2024-12-28 – 2025-01-10 (×14): 1 via ORAL
  Filled 2024-12-27 (×14): qty 1

## 2024-12-27 MED ORDER — TRAZODONE HCL 50 MG PO TABS
75.0000 mg | ORAL_TABLET | Freq: Every evening | ORAL | Status: DC | PRN
  Administered 2024-12-29 – 2025-01-09 (×9): 75 mg via ORAL
  Filled 2024-12-27 (×9): qty 2

## 2024-12-27 MED ORDER — ACETAMINOPHEN 325 MG PO TABS
325.0000 mg | ORAL_TABLET | Freq: Four times a day (QID) | ORAL | Status: DC | PRN
  Administered 2024-12-29 – 2025-01-05 (×4): 650 mg via ORAL
  Filled 2024-12-27 (×4): qty 2

## 2024-12-27 MED ORDER — HYDROXYZINE HCL 25 MG PO TABS
25.0000 mg | ORAL_TABLET | Freq: Three times a day (TID) | ORAL | Status: DC | PRN
  Administered 2024-12-27 – 2024-12-30 (×4): 25 mg via ORAL
  Filled 2024-12-27 (×4): qty 1

## 2024-12-27 MED ORDER — CAMPHOR-MENTHOL 0.5-0.5 % EX LOTN
1.0000 | TOPICAL_LOTION | CUTANEOUS | Status: DC | PRN
  Filled 2024-12-27 (×2): qty 222

## 2024-12-27 MED ORDER — PANTOPRAZOLE SODIUM 40 MG PO TBEC
40.0000 mg | DELAYED_RELEASE_TABLET | Freq: Two times a day (BID) | ORAL | Status: DC
  Administered 2024-12-27 – 2025-01-10 (×28): 40 mg via ORAL
  Filled 2024-12-27 (×28): qty 1

## 2024-12-27 MED ORDER — FERROUS SULFATE 325 (65 FE) MG PO TABS
325.0000 mg | ORAL_TABLET | Freq: Every day | ORAL | Status: DC
  Administered 2024-12-28 – 2025-01-01 (×5): 325 mg via ORAL
  Filled 2024-12-27 (×5): qty 1

## 2024-12-27 MED ORDER — APIXABAN 2.5 MG PO TABS
2.5000 mg | ORAL_TABLET | Freq: Two times a day (BID) | ORAL | Status: DC
  Administered 2024-12-27 – 2025-01-10 (×28): 2.5 mg via ORAL
  Filled 2024-12-27 (×28): qty 1

## 2024-12-27 MED ORDER — SODIUM ZIRCONIUM CYCLOSILICATE 10 G PO PACK
10.0000 g | PACK | Freq: Two times a day (BID) | ORAL | Status: AC
  Administered 2024-12-27 (×2): 10 g via ORAL
  Filled 2024-12-27 (×2): qty 1

## 2024-12-27 MED ORDER — GABAPENTIN 100 MG PO CAPS
100.0000 mg | ORAL_CAPSULE | Freq: Every day | ORAL | Status: DC
  Administered 2024-12-27 – 2024-12-31 (×5): 100 mg via ORAL
  Filled 2024-12-27 (×6): qty 1

## 2024-12-27 MED ORDER — PROSOURCE PLUS PO LIQD
60.0000 mL | Freq: Two times a day (BID) | ORAL | Status: DC
  Administered 2024-12-28 – 2024-12-31 (×6): 60 mL via ORAL
  Filled 2024-12-27 (×7): qty 60

## 2024-12-27 MED ORDER — VITAMIN C 500 MG PO TABS
1000.0000 mg | ORAL_TABLET | Freq: Every day | ORAL | Status: DC
  Administered 2024-12-28 – 2025-01-01 (×5): 1000 mg via ORAL
  Filled 2024-12-27 (×5): qty 2

## 2024-12-27 MED ORDER — RAMELTEON 8 MG PO TABS
8.0000 mg | ORAL_TABLET | Freq: Every day | ORAL | Status: DC
  Administered 2024-12-27 – 2025-01-09 (×14): 8 mg via ORAL
  Filled 2024-12-27 (×15): qty 1

## 2024-12-27 MED ORDER — LOPERAMIDE HCL 2 MG PO CAPS: 4.0000 mg | ORAL_CAPSULE | Freq: Three times a day (TID) | ORAL | Status: DC | PRN

## 2024-12-27 MED ORDER — MIDODRINE HCL 5 MG PO TABS
5.0000 mg | ORAL_TABLET | Freq: Three times a day (TID) | ORAL | Status: DC
  Administered 2024-12-27 – 2025-01-10 (×42): 5 mg via ORAL
  Filled 2024-12-27 (×42): qty 1

## 2024-12-27 NOTE — Progress Notes (Signed)
 Patient ID: Shane [name], male   DOB: 08/25/1941, 84 y.o.   MRN: [id number] [facility] Progress Note   Assessment/ Plan:   1. Acute kidney Injury on chronic kidney disease stage III: Secondary to ATN with dependency on CRRT between 1/10 - 1/12 and then transitioned onto intermittent hemodialysis on MWF schedule.  Oliguric overnight with improvement of BUN/creatinine.  Last hemodialysis was on Wednesday 12/25/2024 and it appears that renal recovery may be underway.  I will treat his hyperkalemia today with Lokelma  and check labs again tomorrow morning to determine recovery versus need for dialysis.  Clinically appears to be close to euvolemic and without need for diuretic. 2.  Septic shock: Secondary to gangrene of left foot status post left BKA on 12/11/2024.  Transitioned to midodrine  after previously requiring pressors.  Completed Zyvox  and Zosyn . 3.  Paroxysmal atrial fibrillation: Rate controlled with ongoing anticoagulation with Eliquis . 4.  Anemia: Secondary to chronic illness and recent exacerbation by postop losses.  Will continue to monitor to determine need for adjustment of ESA therapy. 5.  Status post left below-knee amputation: With evidence of peripheral vascular disease and gangrene of left foot.  Subjective:   Comfortable overnight and earlier this morning passed swallow evaluation   Objective:   BP (!) 104/54 (BP Location: Right Arm)   Pulse 60   Temp 97.8 F (36.6 C) (Oral)   Resp 16   Ht 5' (1.524 m)   Wt 80 kg Comment: Took everthing thet was connected to bed off.  SpO2 100%   BMI 34.44 kg/m   Intake/Output Summary (Last 24 hours) at 12/27/2024 1021 Last data filed at 12/27/2024 0700 Gross per 24 hour  Intake 10 ml  Output 150 ml  Net -140 ml   Weight change: -26.1 kg  Physical Exam: Gen: Resting comfortably in bed, nurse at bedside CVS: Pulse regular rhythm, normal rate, S1 and S2 normal Resp: Clear to auscultation bilaterally, no rales/rhonchi.   Right IJ temporary dialysis catheter Abd: Soft, obese, nontender, bowel sounds normal Ext: Status post left BKA with wound VAC in situ.  Status post prior right BKA.  Imaging: No results found.   Labs: BMET Recent Labs  Lab 12/21/24 0511 12/22/24 0500 12/23/24 0420 12/24/24 0448 12/25/24 0552 12/27/24 0302  NA 135 137 136 136 133* 131*  K 4.2 4.7 4.9 4.7 5.1 5.6*  CL 96* 98 99 95* 94* 93*  CO2 29 29 28 30 29 29   GLUCOSE 210* 155* 109* 123* 128* 161*  BUN 71* 102* 110* 77* 99* 83*  CREATININE 2.28* 2.74* 2.72* 2.31* 2.73* 2.70*  CALCIUM  9.0 8.9 9.0 9.0 8.9 9.1  PHOS 2.9 3.9 3.7 4.3 4.7* 4.6   CBC Recent Labs  Lab 12/23/24 0420 12/24/24 1157 12/25/24 0552  WBC 11.6* 14.3* 12.5*  NEUTROABS  --  10.8*  --   HGB 8.5* 9.2* 8.9*  HCT 26.0* 28.3* 27.1*  MCV 91.2 90.4 91.9  PLT 327 359 331    Medications:     acidophilus  1 capsule Oral Daily   apixaban   2.5 mg Per Tube BID   vitamin C   1,000 mg Per Tube Daily   atorvastatin   80 mg Per Tube Daily   Chlorhexidine  Gluconate Cloth  6 each Topical BID   feeding supplement (PROSource TF20)  60 mL Per Tube BID   ferrous sulfate   300 mg Per Tube Q breakfast   gabapentin   100 mg Per Tube QHS   Gerhardt's butt cream  1  Application Topical TID   insulin  aspart  0-15 Units Subcutaneous Q4H   insulin  aspart  2 Units Subcutaneous Q4H   insulin  glargine  18 Units Subcutaneous BID   midodrine   5 mg Per Tube Q8H   multivitamin with minerals  1 tablet Per Tube q AM   nutrition supplement (JUVEN)  1 packet Per Tube BID BM   mouth rinse  15 mL Mouth Rinse 4 times per day   pantoprazole  (PROTONIX ) IV  40 mg Intravenous BID   ramelteon   8 mg Per Tube QHS   sodium chloride  flush  10-40 mL Intracatheter Q12H   sodium zirconium cyclosilicate   10 g Oral BID   tamsulosin   0.4 mg Oral QPC supper    Gordy Blanch, MD 12/27/2024, 10:21 AM

## 2024-12-27 NOTE — Plan of Care (Signed)
" °  Problem: RH BOWEL ELIMINATION Goal: RH STG MANAGE BOWEL WITH ASSISTANCE Description: STG Manage Bowel with min Assistance. Outcome: Progressing   Problem: RH BLADDER ELIMINATION Goal: RH STG MANAGE BLADDER WITH ASSISTANCE Description: STG Manage Bladder With min  Assistance Outcome: Progressing   Problem: RH SKIN INTEGRITY Goal: RH STG SKIN FREE OF INFECTION/BREAKDOWN Description: Manage skin free of infection with min assistance Outcome: Progressing   Problem: RH SAFETY Goal: RH STG ADHERE TO SAFETY PRECAUTIONS W/ASSISTANCE/DEVICE Description: STG Adhere to Safety Precautions With min Assistance/Device. Outcome: Progressing   Problem: RH PAIN MANAGEMENT Goal: RH STG PAIN MANAGED AT OR BELOW PT'S PAIN GOAL Description: <4 w/ prns Outcome: Progressing   Problem: RH KNOWLEDGE DEFICIT GENERAL Goal: RH STG INCREASE KNOWLEDGE OF SELF CARE AFTER HOSPITALIZATION Description: Manage knowledge of self care after hospitalization with min assistance from spouse using educational materials provided  Outcome: Progressing   "

## 2024-12-27 NOTE — Progress Notes (Signed)
 Patient ID: Shane [name], male   DOB: [birth year], 84 y.o.   MRN: [id number]  Statement of service delivered.

## 2024-12-27 NOTE — Progress Notes (Signed)
 " Inpatient Rehabilitation Care Coordinator Assessment and Plan Patient Details  Name: Shane [name] MRN: [id number] Date of Birth: 08/23/1941  Today's Date: 12/27/2024  Hospital Problems: Principal Problem:   Left below-knee amputee [facility])  Past Medical History:  Past Medical History:  Diagnosis Date   Acute blood loss anemia    Anxiety    AV block, Mobitz 1    Cataract    Chronic kidney disease    d/t DM   CKD (chronic kidney disease), stage III (HCC)    COVID-19 virus infection 07/18/2020   Last Assessment & Plan:   Formatting of this note might be different from the original.  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 10/2019 Echo: EF 55-60%, Gr2 DD. Nl RV.    Neuromuscular disorder (HCC)    Neuropathy associated with endocrine disorder    Nonobstructive CAD (coronary artery disease)    a. 2012 Cath: mod, nonobs dzs; b. 10/2016 MV: EF 60%, no ischemia.   OSA on CPAP 05/05/2017   Unattended Home Sleep Test 7/2/813-AHI 38.6/hour, desaturation to 64%, body weight 261 pounds   PONV (postoperative nausea and vomiting)    Postoperative anemia due to acute blood loss 11/07/2020   Postoperative hemorrhagic shock 03/20/2021   Sleep apnea     uses cpap asked to bring mask and tubing   Past Surgical History:  Past Surgical History:  Procedure Laterality Date   ABDOMINAL AORTOGRAM W/LOWER EXTREMITY N/A 12/10/2020   Procedure: ABDOMINAL AORTOGRAM W/LOWER EXTREMITY;  Surgeon: Magda Debby SAILOR, MD;  Location: [facility] INVASIVE CV LAB;   Service: Cardiovascular;  Laterality: N/A;   AMPUTATION Right 01/22/2021   Procedure: 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;  Surgeon: Harden Jerona GAILS, MD;  Location: [facility] OR;  Service: Orthopedics;  Laterality: Right;   AMPUTATION Right 03/17/2021   Procedure: RIGHT BELOW KNEE AMPUTATION;  Surgeon: Harden Jerona GAILS, MD;  Location: [facility] OR;  Service: Orthopedics;  Laterality: Right;   AMPUTATION Left 12/11/2024   Procedure: AMPUTATION BELOW KNEE;  Surgeon: Harden Jerona GAILS, MD;  Location: [facility] OR;  Service: Orthopedics;  Laterality: Left;   ANKLE FUSION Right 01/22/2021   Procedure: RIGHT FOOT TIBIOCALCANEAL FUSION;  Surgeon: Harden Jerona GAILS, MD;  Location: [facility] OR;  Service: Orthopedics;  Laterality: Right;   ANTERIOR FUSION CERVICAL SPINE  2012   APPLICATION OF WOUND VAC Left 12/11/2024   Procedure: APPLICATION, WOUND VAC;  Surgeon: Harden Jerona GAILS, MD;  Location: [facility] OR;  Service: Orthopedics;  Laterality: Left;   CARDIAC CATHETERIZATION  2011   CARDIAC CATHETERIZATION N/A 11/09/2016   Procedure: Right Heart Cath;  Surgeon: Victory LELON Sharps, MD;  Location: [facility] INVASIVE CV LAB;  Service: Cardiovascular;  Laterality: N/A;   carpel tunnel     left wrist   CATARACT EXTRACTION     CATARACT EXTRACTION W/ INTRAOCULAR LENS  IMPLANT, BILATERAL  2013   CERVICAL LAMINECTOMY  2012   COLONOSCOPY N/A 01/14/2013   Procedure: COLONOSCOPY;  Surgeon:  Norleen LOISE Kiang, MD;  Location: [facility];  Service: Endoscopy;  Laterality: N/A;   CORONARY ARTERY BYPASS GRAFT  11/04/2020   LIMA-LAD, SVG-OM1, SVG-PDA (Dr Norleen Exon [facility]) dc 11/18/2020   EYE SURGERY     I & D EXTREMITY Right 02/19/2021   Procedure: RIGHT ANKLE DEBRIDEMENT AND PLACEMENT ANTIBIOTIC BEADS;  Surgeon: Harden Jerona GAILS, MD;  Location: [facility] OR;  Service: Orthopedics;  Laterality: Right;   KNEE SURGERY  1998   left   LEFT HEART CATH AND CORONARY ANGIOGRAPHY N/A 07/10/2020   Procedure: LEFT HEART CATH AND CORONARY ANGIOGRAPHY;  Surgeon: Wonda Sharper, MD;  Location: [facility] INVASIVE CV LAB;   Service: Cardiovascular;  Laterality: N/A;   LUMBAR LAMINECTOMY  2003   LUNG BIOPSY Left 12/26/2016   Procedure: LUNG BIOPSY;  Surgeon: Elspeth JAYSON Millers, MD;  Location: [facility] OR;  Service: Thoracic;  Laterality: Left;   PACEMAKER IMPLANT N/A 03/30/2020   Procedure: PACEMAKER IMPLANT;  Surgeon: Waddell Danelle ORN, MD;  Location: [facility]AS[facility];  Service: Cardiovascular;  Laterality: N/A;   PERIPHERAL VASCULAR INTERVENTION Right 12/10/2020   Procedure: PERIPHERAL VASCULAR INTERVENTION;  Surgeon: Magda Debby LOISE, MD;  Location: [facility] INVAS[facility] LAB;  Service: Cardiovascular;  Laterality: Right;  SFA   POSTERIOR FUSION CERVICAL SPINE  2012   PPM GENERATOR REMOVAL N/A 12/14/2020   Procedure: PPM GENERATOR REMOVAL;  Surgeon: Waddell Danelle ORN, MD;  Location: [facility]VAS[facility] LAB;  Service: Cardiovascular;  Laterality: N/A;   TEE WITHOUT CARDIOVERSION N/A 12/11/2020   Procedure: TRANSESOPHAGEAL ECHOCARDIOGRAM (TEE);  Surgeon: Barbaraann Darryle Debby, MD;  Location: [facility] ENDOSCOPY;  Service: Cardiovascular;  Laterality: N/A;   TRIGGER FINGER RELEASE  2011   4th finger left hand   VIDEO ASSISTED THORACOSCOPY Left 12/26/2016   Procedure: VIDEO ASSISTED THORACOSCOPY;  Surgeon: Elspeth JAYSON Millers, MD;  Location: [facility] OR;  Service: Thoracic;  Laterality: Left;   VIDEO BRONCHOSCOPY N/A 12/26/2016   Procedure: VIDEO BRONCHOSCOPY;  Surgeon: Elspeth JAYSON Millers, MD;  Location: [facility] OR;  Service: Thoracic;  Laterality: N/A;   Social History:  reports that he has never smoked. He has been exposed to tobacco smoke. He has never used smokeless tobacco. He reports that he does not drink alcohol and does not use drugs.  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) Name of Employer: [employer] Return to Work 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: N/a Guardian/Conservator: N/a   Abuse/Neglect Abuse/Neglect Assessment Can Be Completed: Yes Physical Abuse: Denies Verbal Abuse: Denies Sexual Abuse: Denies Exploitation of patient/patient's resources: Denies Self-Neglect: Denies  Patient response to: Social Isolation - How often do you feel lonely or isolated from those around you?: Never  Emotional Status Pt's affect, behavior and adjustment status: Adjusting well to therapy Recent Psychosocial Issues: None Psychiatric History: None Substance Abuse History: None  Patient / Family Perceptions, Expectations & Goals Pt/Family understanding of illness & functional limitations: Understanding of illness & functional limitations Premorbid pt/family roles/activities: Active in the community with an art gallery manager Anticipated changes in roles/activities/participation: None anticipated Pt/family expectations/goals: Realistic expectations/goals  Community [employer] Agencies: None Premorbid Home Care/DME Agencies: Other (Comment) Animal Nutritionist, Agricultural Consultant (2 wheels), Art gallery manager, Wheelchair - manual, [employer] - single point, Other (comment), Grab bars - tub/shower, Grab bars - toilet, Adaptive equipment) Transportation available at discharge: Yes, Avelina Is the patient able to respond to transportation needs?: Yes In the past 12 months, has lack of transportation kept you from medical appointments or from getting medications?: No In the past 12 months, has lack of transportation kept you from meetings,  work, or from getting things needed for daily living?: No  Discharge Planning Living Arrangements: Spouse/significant other, Other relatives Support Systems: Spouse/significant other, Other relatives Type of Residence: Private residence Insurance Resources: Media Planner (specify) (UNITED HEALTHCARE MEDICARE / UHC MEDICARE) Financial Resources: Employment Financial Screen Referred: Yes Living Expenses: Mortgage Money Management: Patient Does the patient have any problems obtaining your medications?: No Home Management: Patient/Spouse manage own home Patient/Family Preliminary Plans: Plans to return home with the support of his wife and grandson as needed Care Coordinator Anticipated Follow Up Needs: HH/OP DC Planning Additional Notes/Comments: May discharge home with a woundvac, o2 at night and has a concetrator during the day Expected length of stay: 12-16 days  Clinical Impression CSW met with patient/family to introduce herself and complete initial assessment. Patient presented to [facility] following L BKA. Patient is able to make needs known. Signe is a returning patient with a new L BKA. He has a wound vac. Mr. Dini lives with his wife Avelina and grandson who he plans to return back home with. He has a ramped entry, o2 at night and uses a concentrator during the day.   There were no further needs or concerns at present. CSW will follow up with family and continue to follow. Will provide patient/family with an update as soon as one becomes available.   Di'Asia  Loreli 12/27/2024, 3:48 PM    "

## 2024-12-27 NOTE — Progress Notes (Signed)
 Inpatient Rehabilitation Center Individual Statement of Services  Patient Name:  Shane [name]  Date:  12/27/2024  Welcome to the Inpatient Rehabilitation Center.  Our goal is to provide you with an individualized program based on your diagnosis and situation, designed to meet your specific needs.  With this comprehensive rehabilitation program, you will be expected to participate in at least 3 hours of rehabilitation therapies Monday-Friday, with modified therapy programming on the weekends.  Your rehabilitation program will include the following services:  Physical Therapy (PT), Occupational Therapy (OT), Speech Therapy (ST), 24 hour per day rehabilitation nursing, Neuropsychology, Care Coordinator, Rehabilitation Medicine, Nutrition Services, and Pharmacy Services  Weekly team conferences will be held on Wednesday to discuss your progress.  Your Inpatient Rehabilitation Care Coordinator will talk with you frequently to get your input and to update you on team discussions.  Team conferences with you and your family in attendance may also be held.  Expected length of stay: 12-16 days  Overall anticipated outcome: Supervision to Min Assist   Depending on your progress and recovery, your program may change. Your Inpatient Rehabilitation Care Coordinator will coordinate services and will keep you informed of any changes. Your Inpatient Rehabilitation Care Coordinator's name and contact numbers are listed  below.  The following services may also be recommended but are not provided by the Inpatient Rehabilitation Center:  Driving Evaluations Home Health Rehabiltiation Services Outpatient Rehabilitation Services Vocational Rehabilitation   Arrangements will be made to provide these services after discharge if needed.  Arrangements include referral to agencies that provide these services.  Your insurance has been verified to be: UNITED HEALTHCARE MEDICARE / [facility] MEDICARE  Your primary doctor is:  Shepard Ade, MD   Pertinent information will be shared with your doctor and your insurance company.  Inpatient Rehabilitation Care Coordinator:  Di'Asia Loreli SIERRAS [phone] or ELIGAH BRINKS  Information discussed with and copy given to patient by: Waverly Loreli, 12/27/2024, 10:40 AM

## 2024-12-27 NOTE — Plan of Care (Signed)
" °  Problem: Consults Goal: RH GENERAL PATIENT EDUCATION Description: See Patient Education module for education specifics. Outcome: Progressing   Problem: RH BOWEL ELIMINATION Goal: RH STG MANAGE BOWEL WITH ASSISTANCE Description: STG Manage Bowel with min Assistance. Outcome: Progressing   Problem: RH BLADDER ELIMINATION Goal: RH STG MANAGE BLADDER WITH ASSISTANCE Description: STG Manage Bladder With min  Assistance Outcome: Progressing   Problem: RH SKIN INTEGRITY Goal: RH STG SKIN FREE OF INFECTION/BREAKDOWN Description: Manage skin free of infection with min assistance Outcome: Progressing   Problem: RH SAFETY Goal: RH STG ADHERE TO SAFETY PRECAUTIONS W/ASSISTANCE/DEVICE Description: STG Adhere to Safety Precautions With min Assistance/Device. Outcome: Progressing   Problem: RH PAIN MANAGEMENT Goal: RH STG PAIN MANAGED AT OR BELOW PT'S PAIN GOAL Description: <4 w/ prns Outcome: Progressing   Problem: RH KNOWLEDGE DEFICIT GENERAL Goal: RH STG INCREASE KNOWLEDGE OF SELF CARE AFTER HOSPITALIZATION Description: Manage knowledge of self care after hospitalization with min assistance from spouse using educational materials provided  Outcome: Progressing   "

## 2024-12-27 NOTE — Progress Notes (Signed)
 "                                                        PROGRESS NOTE   Subjective/Complaints: Had some itching on his back again early this morning, improved by the time I spoke with him.  Started on diet today after evaluation by SLP D3 thin.  LBM 1/21   ROS: Patient denies fever, new vision changes, dizziness, nausea, vomiting,   chest pain, headache, or mood change. Diarrhea- improved Itching- intermittent Shortness of breath with activity-chronic at baseline  Objective:   No results found. Recent Labs    12/25/24 0552  WBC 12.5*  HGB 8.9*  HCT 27.1*  PLT 331   Recent Labs    12/25/24 0552 12/27/24 0302  NA 133* 131*  K 5.1 5.6*  CL 94* 93*  CO2 29 29  GLUCOSE 128* 161*  BUN 99* 83*  CREATININE 2.73* 2.70*  CALCIUM  8.9 9.1    Intake/Output Summary (Last 24 hours) at 12/27/2024 1253 Last data filed at 12/27/2024 0700 Gross per 24 hour  Intake 10 ml  Output 150 ml  Net -140 ml        Physical Exam: Vital Signs Blood pressure (!) 104/54, pulse 60, temperature 97.8 F (36.6 C), temperature source Oral, resp. rate 16, height 5' (1.524 m), weight 80 kg, SpO2 100%.  General: No apparent distress, obese HEENT: Head is normocephalic, atraumatic, + NG tube, +[place] 2L Neck: Supple without JVD or lymphadenopathy Heart: Reg rate and rhythm.  Chest: CTA bilaterally, R internal jugular TDC Abdomen: Soft, non-tender, non-distended, bowel sounds positive. Extremities: Left BKA is dressed with wound VAC about 200 mL in the canister.  Right BKA well-healed.  Psych: Pt's affect is appropriate. Pt is cooperative Skin: bruising noted on Ues, no significant rash noted on his back Neuro:   Alert and awake, follows simple commands, CN 2-12 grossly intact, moving all 4 extremities to gravity     Assessment/Plan: 1. Functional deficits which require 3+ hours per day of interdisciplinary therapy in a comprehensive inpatient rehab setting. Physiatrist is providing close team  supervision and 24 hour management of active medical problems listed below. Physiatrist and rehab team continue to assess barriers to discharge/monitor patient progress toward functional and medical goals  Care Tool:  Bathing    Body parts bathed by patient: Right arm, Left arm, Chest, Abdomen, Face, Front perineal area   Body parts bathed by helper: Buttocks, Right upper leg, Left upper leg, Right lower leg Body parts n/a: Left lower leg   Bathing assist Assist Level: Maximal Assistance - Patient 24 - 49%     Upper Body Dressing/Undressing Upper body dressing   What is the patient wearing?: Hospital gown only    Upper body assist Assist Level: Minimal Assistance - Patient > 75%    Lower Body Dressing/Undressing Lower body dressing      What is the patient wearing?: Hospital gown only     Lower body assist Assist for lower body dressing: Dependent - Patient 0%     Toileting Toileting    Toileting assist Assist for toileting: Dependent - Patient 0%     Transfers Chair/bed transfer  Transfers assist  Chair/bed transfer activity did not occur: Safety/medical concerns (weakness/fatigue/pain/nausea)        Locomotion Ambulation  Ambulation assist   Ambulation activity did not occur: Safety/medical concerns (B BKA)          Walk 10 feet activity   Assist  Walk 10 feet activity did not occur: Safety/medical concerns (B BKA)        Walk 50 feet activity   Assist Walk 50 feet with 2 turns activity did not occur: Safety/medical concerns (B BKA)         Walk 150 feet activity   Assist Walk 150 feet activity did not occur: Safety/medical concerns (B BKA)         Walk 10 feet on uneven surface  activity   Assist Walk 10 feet on uneven surfaces activity did not occur: Safety/medical concerns (B BKA)         Wheelchair     Assist Is the patient using a wheelchair?: Yes Type of Wheelchair: Manual Wheelchair activity did not occur:  Safety/medical concerns (weakness/fatigue/pain/nausea)         Wheelchair 50 feet with 2 turns activity    Assist    Wheelchair 50 feet with 2 turns activity did not occur: Safety/medical concerns (weakness/fatigue/pain/nausea)       Wheelchair 150 feet activity     Assist  Wheelchair 150 feet activity did not occur: Safety/medical concerns (weakness/fatigue/pain/nausea)       Blood pressure (!) 104/54, pulse 60, temperature 97.8 F (36.6 C), temperature source Oral, resp. rate 16, height 5' (1.524 m), weight 80 kg, SpO2 100%.  Medical Problem List and Plan: 1. 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  Antithrombotics: -DVT/anticoagulation:  Pharmaceutical: Eliquis              -antiplatelet therapy: N/A 3. Pain Management: Neurontin  100 mg nightly, oxycodone  as needed, Robaxin  as needed 4. Mood/Behavior/Sleep: Rozerem  8 mg nightly, trazodone  75 mg nightly as needed             -antipsychotic agents: N/A 5. Neuropsych/cognition: This patient is not capable of making decisions on his own behalf. 6. Skin/Wound Care: Routine skin checks 7. Fluids/Electrolytes/Nutrition: Routine and analysis with follow-up chemistries 8.  History of right BKA 2022.  Patient received CIR.  Intermittent use of prosthesis 9.  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  CRRT 1/10 - 1/12 transition to intermittent hemodialysis.  Follow-up renal services  -1/22-23  reviewed nephrology note, monitoring for need for dialysis 10.  Paroxysmal atrial fibrillation.  Continue Eliquis .    -HR stable    12/27/2024    9:44 AM 12/27/2024    4:30 AM 12/26/2024    7:30 PM  Vitals with BMI  Weight  176 lbs 6 oz   BMI  34.44   Systolic 104 109 891  Diastolic  54 49 50  Pulse 60  60    11.  Acute on chronic anemia.  Follow-up CBC.  Continue iron  supplement 12.  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  NovoLog  4 units every 4 hours, Lantus  insulin  18 units twice daily. -1/22 had mild hypoglycemia yesterday, decrease novolog  to 2 units Q4h -1/23 stable continue current regimen monitor CBG (last 3)  Recent Labs    12/27/24 0427 12/27/24 0846 12/27/24 1209  GLUCAP 169* 123* 116*    13.  Hypotension.  ProAmatine  5 mg every 8 hours.  Monitor with increased mobility  - 1/23 BP soft but overall stable continue to monitor    12/27/2024    9:44 AM 12/27/2024    4:30 AM 12/26/2024    7:30 PM  Vitals with BMI  Weight  176 lbs 6 oz   BMI  34.44   Systolic 104 109 891  Diastolic 54 49 50  Pulse 60  60    14.  Chronic systolic congestive heart failure.  Echocardiogram with ejection fraction of 30 to 35%.  Monitor for any signs of fluid overload. Daily weights -1/23 no signs of overload noted, difficult to assess weight trend suspect inaccurate readings  Filed Weights   12/26/24 0450 12/26/24 0630 12/27/24 0430  Weight: 106.1 kg 106.1 kg 80 kg     15.  CAD/AV block/Mobitz 1/aortic stenosis/CABG 2021.  Follow-up cardiology services. 16.  Idiopathic pulmonary fibrosis/OSA.  Followed by Dr. Geronimo.  Patient with 2 L oxygen intermittently at home.  Currently not on CPAP until tube feeds discontinued   17.  Oropharyngeal dysphagia.  Follow-up speech therapy.  Currently with Cortrak feeding tube for nutritional support.  Advance diet as tolerated. Currently NPO with ice chips allowed  - 1/23 diet was upgraded today to D3 thin by SLP   18.  Diarrhea.  Related to tube feeds.  Flexi-Seal in place: continue Continue Imodium .  -1/23 LBM 1/21, improved continue to follow-up   19.  GERD: continue Protonix    20.  Hyperlipidemia: continue Lipitor   21. Itching.  -Continue atarax  PRN  -sarna PRN  22.  Hyperkalemia,  hyponatremia -1/23 nephrology following has ordered Lokelma , appreciate assistance, recheck for tomorrow    LOS: 2 days A FACE TO FACE EVALUATION WAS PERFORMED  Murray Collier 12/27/2024, 12:53 PM     "

## 2024-12-27 NOTE — Progress Notes (Signed)
 Physical Therapy Session Note  Patient Details  Name: Shane [name] MRN: [id number] Date of Birth: [birth year]  Today's Date: 12/27/2024 PT Individual Time: 0730-0813 PT Individual Time Calculation (min): 43 min   Short Term Goals: Week 1:  PT Short Term Goal 1 (Week 1): Pt will perform supine to sit transfers with min A PT Short Term Goal 2 (Week 1): Pt will perform sit to supine transfers with min A PT Short Term Goal 3 (Week 1): Pt will maintain sitting balance EOB with CGA PT Short Term Goal 4 (Week 1): Pt will perform bed <> WC transfers with max A  Skilled Therapeutic Interventions/Progress Updates:   Received pt semi-reclined in bed upset about losing phone and back scratcher. Therapist attempted to locate both and eventually found them tangled in bedsheets. Pt requested to call wife but unable to remember number - therapist assisted with calling wife and pt more at ease. RN called to bedside to disconnect tube feed. Pt agreeable to PT treatment and reported pain 4/10 in L residual limb. Session with emphasis on functional mobility/transfers, dressing, generalized strengthening and endurance, and limb loss education. Donned shorts in supine with max A to thread wound vac through. Pt rolled L/R with supervision using bedrails and required max A to pull pants over hips. Pt transferred into sitting L EOB with HOB elevated and use of bedrails with min A. Removed soiled gown and washed face and upper body with setup assist.  Pt able to maintain sitting balance with 1UE support and close supervision/CGA with occasional posterior lean. Therapist assisted with washing/drying back. Donned clean pull over shirt with mod A, and pt reported feeling much better. Returned to supine with CGA and repositioned in bed for comfort.  Provided pt with/discussed the following limb loss resources: -balanced amputee brochure -amputee support group of the [facility] -general rehab timeline -contracture  prevention -shrinker care guidelines -questions to prepare for prosthetic evaluation -peer support program and connecting with other amputees  Concluded session with pt semi-reclined in bed, needs within reach, and bed alarm on in preparation for transport for MBS with SLP.   Therapy Documentation Precautions:  Precautions Precautions: Fall Recall of Precautions/Restrictions: Intact Precaution/Restrictions Comments: watch BP wound VAC, LLE splint Restrictions Weight Bearing Restrictions Per Provider Order: Yes LLE Weight Bearing Per Provider Order: Non weight bearing  Therapy/Group: Individual Therapy Therisa HERO Zaunegger Therisa Stains PT, DPT 12/27/2024, 7:07 AM

## 2024-12-27 NOTE — Procedures (Signed)
 Modified Barium Swallow Study  Patient Details  Name: Shane [name] MRN: [id number] Date of Birth: 1941/06/02  Today's Date: 12/27/2024  Modified Barium Swallow completed.  Full report located under Chart Review in the Imaging Section.  History of Present Illness 84 yo male admitted 1/7 for L transtibial amputation. Transferred to ICU 1/8 for hypotension. CRRT started 1/10. Intermittent dialysis started 1/14. Seen by SLP 12/2020 and 12/2023 with concern for primary esophageal dysphagia, complaining of difficulty with solids>liquids and episodes of regurgitation. originally declined an MBS and although recommended, esophageal assessment was not been completed. Remains NPO. PMH: CKD III, DM, HTN, HLD, IPF, OSA on CPAP, HFrEF with LVEF 30-35%   Clinical Impression Oropharyngeal swallow WFL across all consistencies trialed. He presented w/ flash penetration w/ thin and nectar thick liquids, which is WFL given pt's age. No other instances of airway invasion noted. Some esophageal retention was evident, which aligns with pt's complaints. He would benefit from initiation of PO diet: Dysphagia 3 textures and thin liquids. Meds per pt preference. He would benefit from assistance w/ set up of meal only. Pt will likely tolerate upgrade to regular textures quickly, will monitor diet tolerance in upcoming tx sessions. Continue w/ current POC.   Factors that may increase risk of adverse event in presence of aspiration [name]:    Swallow Evaluation Recommendations Recommendations: PO diet PO Diet Recommendation: Dysphagia 3 (Mechanical soft);Thin liquids (Level 0) Liquid Administration via: Straw Medication Administration: Whole meds with liquid Supervision: Patient able to self-feed;Set-up assistance for safety Postural changes: Position pt fully upright for meals Oral care recommendations: Oral care BID (2x/day)      Recardo DELENA Mole 12/27/2024,12:41 PM

## 2024-12-27 NOTE — Progress Notes (Signed)
 Occupational Therapy Session Note  Patient Details  Name: Shane [name] MRN: [id number] Date of Birth: [birth year]  Session 1: Today's Date: 12/27/2024 OT Individual Time: 1030-1200 OT Individual Time Calculation (min): 90 min   Session 2: Today's Date: 12/27/2024 OT Missed Time: 60 Minutes Missed Time Reason: Other (comment) (patient nausea, medication being administered)  Short Term Goals: Week 1:  OT Short Term Goal 1 (Week 1): Patient to perform bed level LE dressing with Mod assist OT Short Term Goal 2 (Week 1): Patient to perform [facility] transfer with Max of one OT Short Term Goal 3 (Week 1): Patient to perform bathing at bed level with Min assist  Session 1: Skilled Therapeutic Interventions/Progress Updates:   Patient agreeable to participate in OT session. Reports 0/10 pain level.   Patient participated in skilled OT session focusing on functional transfers, LB changing, bed mobility, sitting tolerance, education. Patient received in room with nursing. Patient completed brief changing/ LB dressing with total A at bed level min to mod A to roll. Patient then completed supine to sit with min to mod A. Patient able to tolerate sitting on EOB for education on positioning, transfers, safety, amputation education for 15 minutes with good balance. Patient then completed slide board transfer without prosthetic with max A to total A. Patient total A in wc due to weakness. OT placed new leg rests on patient chair for increased comfort due to pain reported. Patient educated on slide board transfer, placement, and side leaning with mod A while seated in gym on wc. Patient then able to complete leg lifts and extension with each limb. Patient and OT facilitated increased extension on prior residual limb R side due to beginning of contracture formation. Patient then returned to room, prosthetic placed to evaluate patients ability to don and transfer with. Patient required max A to don prosthetic and +2  to return to bed via slide board transfer due to weakness. Patient left in bed following session all needs in reach.    Session 2:  Patient missed skilled therapy session due to nausea/ pain/ weakness. Nursing administering medication. Patient refused services due to ill feeling at this time. Will attempt to make up as time allows.   Therapy Documentation Precautions:  Precautions Precautions: Fall Recall of Precautions/Restrictions: Intact Precaution/Restrictions Comments: watch BP wound VAC, LLE splint Restrictions Weight Bearing Restrictions Per Provider Order: Yes LLE Weight Bearing Per Provider Order: Non weight bearing  Therapy/Group: Individual Therapy  [name] Dyesha Henault 12/27/2024, 8:13 AM

## 2024-12-28 ENCOUNTER — Inpatient Hospital Stay (HOSPITAL_COMMUNITY)

## 2024-12-28 DIAGNOSIS — R112 Nausea with vomiting, unspecified: Secondary | ICD-10-CM

## 2024-12-28 LAB — GLUCOSE, CAPILLARY
Glucose-Capillary: 107 mg/dL — ABNORMAL HIGH (ref 70–99)
Glucose-Capillary: 111 mg/dL — ABNORMAL HIGH (ref 70–99)
Glucose-Capillary: 117 mg/dL — ABNORMAL HIGH (ref 70–99)
Glucose-Capillary: 150 mg/dL — ABNORMAL HIGH (ref 70–99)
Glucose-Capillary: 69 mg/dL — ABNORMAL LOW (ref 70–99)
Glucose-Capillary: 94 mg/dL (ref 70–99)

## 2024-12-28 LAB — RENAL FUNCTION PANEL
Albumin: 3.2 g/dL — ABNORMAL LOW (ref 3.5–5.0)
Anion gap: 9 (ref 5–15)
BUN: 93 mg/dL — ABNORMAL HIGH (ref 8–23)
CO2: 29 mmol/L (ref 22–32)
Calcium: 8.6 mg/dL — ABNORMAL LOW (ref 8.9–10.3)
Chloride: 93 mmol/L — ABNORMAL LOW (ref 98–111)
Creatinine, Ser: 2.84 mg/dL — ABNORMAL HIGH (ref 0.61–1.24)
GFR, Estimated: 21 mL/min — ABNORMAL LOW
Glucose, Bld: 119 mg/dL — ABNORMAL HIGH (ref 70–99)
Phosphorus: 4.1 mg/dL (ref 2.5–4.6)
Potassium: 5.6 mmol/L — ABNORMAL HIGH (ref 3.5–5.1)
Sodium: 131 mmol/L — ABNORMAL LOW (ref 135–145)

## 2024-12-28 MED ORDER — HEPARIN SODIUM (PORCINE) 1000 UNIT/ML IJ SOLN
2400.0000 [IU] | Freq: Once | INTRAMUSCULAR | Status: AC
  Administered 2024-12-28: 2400 [IU]

## 2024-12-28 MED ORDER — HEPARIN SODIUM (PORCINE) 1000 UNIT/ML IJ SOLN
INTRAMUSCULAR | Status: AC
  Filled 2024-12-28: qty 4

## 2024-12-28 MED ORDER — HEPARIN SODIUM (PORCINE) 1000 UNIT/ML IJ SOLN
INTRAMUSCULAR | Status: AC
  Filled 2024-12-28: qty 3

## 2024-12-28 MED ORDER — JUVEN PO PACK
1.0000 | PACK | Freq: Two times a day (BID) | ORAL | Status: DC
  Administered 2024-12-28 – 2024-12-31 (×6): 1 via ORAL
  Filled 2024-12-28 (×6): qty 1

## 2024-12-28 MED ORDER — OXYCODONE HCL 5 MG PO TABS
ORAL_TABLET | ORAL | Status: AC
  Filled 2024-12-28: qty 1

## 2024-12-28 MED ORDER — HEPARIN SODIUM (PORCINE) 1000 UNIT/ML DIALYSIS
40.0000 [IU]/kg | INTRAMUSCULAR | Status: DC | PRN
  Administered 2024-12-28: 3200 [IU] via INTRAVENOUS_CENTRAL

## 2024-12-28 NOTE — Progress Notes (Signed)
 Pt has been complaining of nausea and stomach tightness this am. Prn Zofran  given and tube feeds have been stopped momentarily to relieve nausea. Cortrak flushed and pt instantly became nauseous again. Charge nurse Rosina made aware and reassessed pt as well. Bladder scanned to rule out potential urine retention. Bladder scan was 39. Pt stated that this has been going on, on and off and stated If I could just throw up I would feel better pt in bed with call bell in reach, no further concerns at this time.

## 2024-12-28 NOTE — IPOC Note (Signed)
 Overall Plan of Care [facility]) Patient Details Name: Shane [name] MRN: [id number] DOB: Aug 29, 1941  Admitting Diagnosis: Left below-knee amputee [facility])  Hospital Problems: Principal Problem:   Left below-knee amputee [facility])     Functional Problem List: Nursing Bladder, Bowel, Edema, Endurance, Medication Management, Nutrition, Pain, Safety, Skin Integrity  PT Balance, Edema, Endurance, Motor, Nutrition, Pain, Safety, Sensory, Skin Integrity  OT Balance, Pain, Safety, Cognition, Sensory, Endurance, Skin Integrity, Motor  SLP Cognition, Nutrition  TR         Basic ADLs: OT Eating, Grooming, Bathing, Dressing, Toileting     Advanced  ADLs: OT       Transfers: PT Bed Mobility, Bed to Chair, Car, Occupational Psychologist, Research Scientist (life Sciences): PT Wheelchair Mobility     Additional Impairments: OT Fuctional Use of Upper Extremity  SLP Swallowing, Social Cognition   Problem Solving, Memory  TR      Anticipated Outcomes Item Anticipated Outcome  Self Feeding Supervision  Swallowing  modI   Basic self-care  Min assist  Toileting  Min assist using BSC   Bathroom Transfers Min assist  Bowel/Bladder  manage bowels with medications/ manage bladder with toileting assistance  Transfers  Min A  Locomotion  S at [facility] level  Communication     Cognition  modI  Pain  <4 w/ prns  Safety/Judgment  manage safety with min assistance   Therapy Plan: PT Intensity: Minimum of 1-2 x/day ,45 to 90 minutes PT Frequency: 5 out of 7 days PT Duration Estimated Length of Stay: 2-3 weeks OT Intensity: Minimum of 1-2 x/day, 45 to 90 minutes OT Frequency: 5 out of 7 days OT Duration/Estimated Length of Stay: 2-3 weeks SLP Intensity: Minumum of 1-2 x/day, 30 to 90 minutes SLP Frequency: 3 to 5 out of 7 days SLP Duration/Estimated Length of Stay: TBD   Team Interventions: Nursing Interventions Patient/Family Education, Skin Care/Wound Management, Bladder Management, Bowel  Management, Disease Management/Prevention, Pain Management, Medication Management, Discharge Planning  PT interventions Ambulation/gait training, Discharge planning, Functional mobility training, Psychosocial support, Therapeutic Activities, Visual/perceptual remediation/compensation, Wheelchair propulsion/positioning, Therapeutic Exercise, Skin care/wound management, Neuromuscular re-education, Disease management/prevention, Metallurgist training, Cognitive remediation/compensation, DME/adaptive equipment instruction, Pain management, Splinting/orthotics, UE/LE Strength taining/ROM, UE/LE Coordination activities, Stair training, Patient/family education, Functional electrical stimulation, Community reintegration  OT Interventions Discharge planning, Pain management, Self Care/advanced ADL retraining, Therapeutic Activities, UE/LE Coordination activities, Therapeutic Exercise, Skin care/wound managment, Patient/family education, Functional mobility training, Disease mangement/prevention, Cognitive remediation/compensation, DME/adaptive equipment instruction, UE/LE Strength taining/ROM, Wheelchair propulsion/positioning  SLP Interventions Cognitive remediation/compensation, Therapeutic Activities, Cueing hierarchy, Functional tasks, Oral motor exercises, Dysphagia/aspiration precaution training, Patient/family education  TR Interventions    SW/CM Interventions Discharge Planning, Psychosocial Support, Patient/Family Education, Disease Management/Prevention   Barriers to Discharge MD  Medical stability, Wound care, Hemodialysis, and Nutritional means  Nursing Decreased caregiver support, Home environment access/layout, Incontinence, Wound Care, Hemodialysis Discharge: House  Discharge Home Layout: Able to live on main level with bedroom/bathroom  Discharge Home Access: Ramped entrance  PT Incontinence, Wound Care, New oxygen, Weight bearing restrictions    OT Wound Care, New oxygen, Weight bearing  restrictions, Incontinence    SLP      SW       Team Discharge Planning: Destination: PT-Home ,OT- Home , SLP-Home Projected Follow-up: PT-Home health PT, OT-  Home health OT, 24 hour supervision/assistance, SLP-Outpatient SLP, Home Health SLP Projected Equipment Needs: PT-To be determined, OT- To be determined, SLP-None recommended by SLP Equipment Details: PT- , OT-Patient  may need education on roll- in style shower chairs. 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 The team will be addressing functional mobility, strength, stamina, balance, safety, adaptive techniques and equipment, self-care, bowel and bladder mgt, patient and caregiver education. Goals have been set at min a. Anticipated discharge destination is home.        See Team Conference Notes for weekly updates to the plan of care

## 2024-12-28 NOTE — Progress Notes (Signed)
 Patient ID: Shane [name], male   DOB: [birth year], 84 y.o.   MRN: [id number] [facility] Progress Note   Assessment/ Plan:   1. Acute kidney Injury on chronic kidney disease stage III: Secondary to ATN with dependency on CRRT between 1/10 - 1/12 and then transitioned onto intermittent hemodialysis on MWF schedule.  Oliguric overnight with improvement of BUN/creatinine.  Last hemodialysis was on Wednesday 12/25/2024 and yesterday did not have dialysis with evidence of possible renal recovery.  Will undertake dialysis today for the chance that his azotemia may be in part responsible for his nausea/GI symptoms.  Limited ultrafiltration so as to not hamper renal recovery. 2.  Septic shock: Secondary to gangrene of left foot status post left BKA on 12/11/2024.  Transitioned to midodrine  after previously requiring pressors.  Completed Zyvox  and Zosyn . 3.  Paroxysmal atrial fibrillation: Rate controlled with ongoing anticoagulation with Eliquis . 4.  Anemia: Secondary to chronic illness and recent exacerbation by postop losses.  Will continue to monitor to determine need for adjustment of ESA therapy. 5.  Status post left below-knee amputation: With evidence of peripheral vascular disease and gangrene of left foot.  Subjective:   Problems reported with nausea and stomach upset overnight.   Objective:   BP (!) 113/55 (BP Location: Left Arm)   Pulse (!) 59   Temp 97.8 F (36.6 C) (Oral)   Resp 18   Ht 5' (1.524 m)   Wt 80 kg Comment: Took everthing thet was connected to bed off.  SpO2 100%   BMI 34.44 kg/m   Intake/Output Summary (Last 24 hours) at 12/28/2024 9072 Last data filed at 12/28/2024 9078 Gross per 24 hour  Intake 594 ml  Output 350 ml  Net 244 ml   Weight change:   Physical Exam: Gen: Comfortably resting in bed, nurse tech at bedside CVS: Pulse regular rhythm, normal rate, S1 and S2 normal Resp: Clear to auscultation bilaterally, no rales/rhonchi.  Right IJ  temporary dialysis catheter Abd: Soft, obese, nontender, bowel sounds normal Ext: Status post left BKA with wound VAC in situ.  Status post prior right BKA.  Imaging: DG Swallowing Func-Speech Pathology Result Date: 12/27/2024 Table formatting from the original result was not included. Modified Barium Swallow Study Patient Details Name: Shane [name] MRN: [id number] Date of Birth: 1941-06-17 Today's Date: 12/27/2024 HPI/PMH: HPI: 84 yo male admitted 1/7 for L transtibial amputation. Transferred to ICU 1/8 for hypotension. CRRT started 1/10. Intermittent dialysis started 1/14. Seen by SLP 12/2020 and 12/2023 with concern for primary esophageal dysphagia, complaining of difficulty with solids>liquids and episodes of regurgitation. originally declined an MBS and although recommended, esophageal assessment was not been completed. Remains NPO. PMH: CKD III, DM, HTN, HLD, IPF, OSA on CPAP, HFrEF with LVEF 30-35% Clinical Impression: Oropharyngeal swallow WFL across all consistencies trialed. He presented w/ flash penetration w/ thin and nectar thick liquids, which is WFL given pt's age. No other instances of airway invasion noted. Some esophageal retention was evident, which aligns with pt's complaints. He would benefit from initiation of PO diet: Dysphagia 3 textures and thin liquids. Meds per pt preference. He would benefit from assistance w/ set up of meal only. Pt will likely tolerate upgrade to regular textures quickly, will monitor diet tolerance in upcoming tx sessions. Continue w/ current POC. Factors that may increase risk of adverse event in presence of aspiration [name]: No data recorded Recommendations/Plan: Swallowing Evaluation Recommendations Swallowing Evaluation Recommendations Recommendations: PO diet PO Diet Recommendation: Dysphagia  3 (Mechanical soft); Thin liquids (Level 0) Liquid Administration via: Straw Medication Administration: Whole meds with liquid Supervision: Patient  able to self-feed; Set-up assistance for safety Postural changes: Position pt fully upright for meals Oral care recommendations: Oral care BID (2x/day) Treatment Plan Treatment Plan Treatment recommendations: Therapy as outlined in treatment plan below Follow-up recommendations: Acute inpatient rehab (3 hours/day) Recommendations Comment: continue w/ current POC Functional status assessment: Patient has had a recent decline in their functional status and demonstrates the ability to make significant improvements in function in a reasonable and predictable amount of time. Treatment frequency: Min 3x/week Treatment duration: 2 weeks Interventions: Aspiration precaution training; Diet toleration management by SLP; Patient/family education; Trials of upgraded texture/liquids Recommendations Recommendations for follow up therapy are one component of a multi-disciplinary discharge planning process, led by the attending physician.  Recommendations may be updated based on patient status, additional functional criteria and insurance authorization. Assessment: Orofacial Exam: Orofacial Exam Oral Cavity: Oral Hygiene: WFL Oral Cavity - Dentition: Adequate natural dentition Orofacial Anatomy: WFL Oral Motor/Sensory Function: WFL Anatomy: Anatomy: Presence of cervical hardware Boluses Administered: Boluses Administered Boluses Administered: Mildly thick liquids (Level 2, nectar thick); Thin liquids (Level 0); Puree; Solid  Oral Impairment Domain: Oral Impairment Domain Lip Closure: No labial escape Tongue control during bolus hold: Posterior escape of less than half of bolus Bolus preparation/mastication: Timely and efficient chewing and mashing Bolus transport/lingual motion: Brisk tongue motion Oral residue: Trace residue lining oral structures Location of oral residue : Tongue; Palate Initiation of pharyngeal swallow : Posterior laryngeal surface of the epiglottis  Pharyngeal Impairment Domain: Pharyngeal Impairment Domain  Soft palate elevation: No bolus between soft palate (SP)/pharyngeal wall (PW) Laryngeal elevation: Complete superior movement of thyroid cartilage with complete approximation of arytenoids to epiglottic petiole Anterior hyoid excursion: Complete anterior movement Epiglottic movement: Complete inversion Laryngeal vestibule closure: Incomplete, narrow column air/contrast in laryngeal vestibule Pharyngeal stripping wave : Present - complete Pharyngeal contraction (A/P view only): N/A Pharyngoesophageal segment opening: Partial distention/partial duration, partial obstruction of flow Tongue base retraction: Trace column of contrast or air between tongue base and PPW Pharyngeal residue: Trace residue within or on pharyngeal structures Location of pharyngeal residue: Valleculae; Tongue base; Pyriform sinuses  Esophageal Impairment Domain: Esophageal Impairment Domain Esophageal clearance upright position: Esophageal retention Pill: Pill Consistency administered: Thin liquids (Level 0) Thin liquids (Level 0): [facility] Penetration/Aspiration Scale Score: Penetration/Aspiration Scale Score 1.  Material does not enter airway: Puree; Solid 2.  Material enters airway, remains ABOVE vocal cords then ejected out: Mildly thick liquids (Level 2, nectar thick); Thin liquids (Level 0); Pill Compensatory Strategies: Compensatory Strategies Compensatory strategies: Yes Straw: Effective Effective Straw: Thin liquid (Level 0); Mildly thick liquid (Level 2, nectar thick); Pill   General Information: Caregiver present: No  Diet Prior to this Study: NPO; Cortrak/Small bore NG tube   Temperature : Normal   Respiratory Status: WFL   Supplemental O2: Nasal cannula (2L)   History of Recent Intubation: No  Behavior/Cognition: Lethargic/Drowsy; Alert; Cooperative Self-Feeding Abilities: Able to self-feed Baseline vocal quality/speech: Normal No data recorded Volitional Swallow: Able to elicit No data recorded Goal Planning: Prognosis for improved  oropharyngeal function: Good Barriers to Reach Goals: Cognitive deficits No data recorded Patient/Family Stated Goal: eat again Consulted and agree with results and recommendations: Patient Pain: Pain Assessment Pain Assessment: No/denies pain Pain Score: 0 End of Session: Start Time:No data recorded Stop Time: No data recorded Time Calculation:No data recorded Charges: No data recorded SLP visit diagnosis: No  data recorded Past Medical History: Past Medical History: Diagnosis Date  Acute blood loss anemia   Anxiety   AV block, Mobitz 1   Cataract   Chronic kidney disease   d/t DM  CKD (chronic kidney disease), stage III (HCC)   COVID-19 virus infection 07/18/2020  Last Assessment & Plan:   Formatting of this note might be different from the original.  Immunocompromise high risk patient  -ID consulted for further therapies and will administer regeneron as OP tomorrow   -Decadron  discontinued as patient is off O2    Depression   Diabetes mellitus   Vgo disposal insulin  bolus  simular to insulin  pump  Dyspnea   GERD (gastroesophageal reflux disease)   History of kidney stones   passed  Hyperlipidemia   Hypertension   Idiopathic pulmonary fibrosis (HCC) 11/2016  ILD (interstitial lung disease) (HCC)   Moderate aortic stenosis   a. 10/2019 Echo: EF 55-60%, Gr2 DD. Nl RV.   Neuromuscular disorder (HCC)   Neuropathy associated with endocrine disorder   Nonobstructive CAD (coronary artery disease)   a. 2012 Cath: mod, nonobs dzs; b. 10/2016 MV: EF 60%, no ischemia.  OSA on CPAP 05/05/2017  Unattended Home Sleep Test 7/2/813-AHI 38.6/hour, desaturation to 64%, body weight 261 pounds  PONV (postoperative nausea and vomiting)   Postoperative anemia due to acute blood loss 11/07/2020  Postoperative hemorrhagic shock 03/20/2021  Sleep apnea    uses cpap asked to bring mask and tubing Past Surgical History: Past Surgical History: Procedure Laterality Date  ABDOMINAL AORTOGRAM W/LOWER EXTREMITY N/A 12/10/2020  Procedure:  ABDOMINAL AORTOGRAM W/LOWER EXTREMITY;  Surgeon: Magda Debby SAILOR, MD;  Location: [facility] INVASIVE [facility] LAB;  Service: Cardiovascular;  Laterality: N/A;  AMPUTATION Right 01/22/2021  Procedure: 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;  Surgeon: Harden Jerona GAILS, MD;  Location: [facility] OR;  Service: Orthopedics;  Laterality: Right;  AMPUTATION Right 03/17/2021  Procedure: RIGHT BELOW KNEE AMPUTATION;  Surgeon: Harden Jerona GAILS, MD;  Location: [facility] OR;  Service: Orthopedics;  Laterality: Right;  AMPUTATION Left 12/11/2024  Procedure: AMPUTATION BELOW KNEE;  Surgeon: Harden Jerona GAILS, MD;  Location: [facility] OR;  Service: Orthopedics;  Laterality: Left;  ANKLE FUSION Right 01/22/2021  Procedure: RIGHT FOOT TIBIOCALCANEAL FUSION;  Surgeon: Harden Jerona GAILS, MD;  Location: [facility] OR;  Service: Orthopedics;  Laterality: Right;  ANTERIOR FUSION CERVICAL SPINE  2012  APPLICATION OF WOUND VAC Left 12/11/2024  Procedure: APPLICATION, WOUND VAC;  Surgeon: Harden Jerona GAILS, MD;  Location: [facility] OR;  Service: Orthopedics;  Laterality: Left;  CARDIAC CATHETERIZATION  2011  CARDIAC CATHETERIZATION N/A 11/09/2016  Procedure: Right Heart Cath;  Surgeon: Victory LELON Sharps, MD;  Location: [facility] INVASIVE CV LAB;  Service: Cardiovascular;  Laterality: N/A;  carpel tunnel    left wrist  CATARACT EXTRACTION    CATARACT EXTRACTION W/ INTRAOCULAR LENS  IMPLANT, BILATERAL  2013  CERVICAL LAMINECTOMY  2012  COLONOSCOPY N/A 01/14/2013  Procedure: COLONOSCOPY;  Surgeon: Norleen SAILOR Kiang, MD;  Location: [facility] ENDOSCOPY;  Service: Endoscopy;  Laterality: N/A;  CORONARY ARTERY BYPASS GRAFT  11/04/2020  LIMA-LAD, SVG-OM1, SVG-PDA (Dr Norleen [facility]on [facility]) dc 11/18/2020  EYE SURGERY    I & D EXTREMITY Right 02/19/2021  Procedure: RIGHT ANKLE DEBRIDEMENT AND PLACEMENT ANTIBIOTIC BEADS;  Surgeon: Harden Jerona GAILS, MD;  Location: [facility] OR;  Service: Orthopedics;  Laterality: Right;  KNEE SURGERY  1998  left  LEFT HEART CATH AND CORONARY ANGIOGRAPHY N/A 07/10/2020  Procedure: LEFT HEART CATH AND CORONARY ANGIOGRAPHY;  Surgeon: Wonda Sharper,  MD;  Location: [facility];  Service: Cardiovascular;  Laterality: N/A;  LUMBAR LAMINECTOMY  2003  LUNG BIOPSY Left 12/26/2016  Procedure: LUNG BIOPSY;  Surgeon: Elspeth JAYSON Millers, MD;  Location: [facility] OR;  Service: Thoracic;  Laterality: Left;  PACEMAKER IMPLANT N/A 03/30/2020  Procedure: PACEMAKER IMPLANT;  Surgeon: Waddell Danelle ORN, MD;  Location: [facility];  Service: Cardiovascular;  Laterality: N/A;  PERIPHERAL VASCULAR INTERVENTION Right 12/10/2020  Procedure: PERIPHERAL VASCULAR INTERVENTION;  Surgeon: Magda Debby SAILOR, MD;  Location: [facility];  Service: Cardiovascular;  Laterality: Right;  SFA  POSTERIOR FUSION CERVICAL SPINE  2012  PPM GENERATOR REMOVAL N/A 12/14/2020  Procedure: PPM GENERATOR REMOVAL;  Surgeon: Waddell Danelle ORN, MD;  Location: [facility];  Service: Cardiovascular;  Laterality: N/A;  TEE WITHOUT CARDIOVERSION N/A 12/11/2020  Procedure: TRANSESOPHAGEAL ECHOCARDIOGRAM (TEE);  Surgeon: Barbaraann Darryle Debby, MD;  Location: [facility] ENDOSCOPY;  Service: Cardiovascular;  Laterality: N/A;  TRIGGER FINGER RELEASE  2011  4th finger left hand  VIDEO ASSISTED THORACOSCOPY Left 12/26/2016  Procedure: VIDEO ASSISTED THORACOSCOPY;  Surgeon: Elspeth JAYSON Millers, MD;  Location: [facility] OR;  Service: Thoracic;  Laterality: Left;  VIDEO BRONCHOSCOPY N/A 12/26/2016  Procedure: VIDEO BRONCHOSCOPY;  Surgeon: Elspeth JAYSON Millers, MD;  Location: [facility] OR;  Service: Thoracic;  Laterality: N/AMERL Recardo DELENA Berna 12/27/2024, 9:34 PM    Labs: BMET Recent Labs  Lab 12/22/24 0500 12/23/24 0420 12/24/24 0448 12/25/24 0552 12/27/24 0302 12/28/24 0257  NA 137 136 136 133* 131* 131*  K 4.7 4.9 4.7 5.1 5.6* 5.6*  CL 98 99 95* 94* 93* 93*  CO2 29 28 30 29 29 29   GLUCOSE 155* 109* 123* 128* 161* 119*  BUN 102* 110* 77* 99* 83* 93*  CREATININE 2.74* 2.72* 2.31* 2.73* 2.70* 2.84*  CALCIUM  8.9 9.0 9.0 8.9 9.1 8.6*  PHOS 3.9 3.7 4.3 4.7* 4.6 4.1   CBC Recent Labs  Lab 12/23/24 0420  12/24/24 1157 12/25/24 0552  WBC 11.6* 14.3* 12.5*  NEUTROABS  --  10.8*  --   HGB 8.5* 9.2* 8.9*  HCT 26.0* 28.3* 27.1*  MCV 91.2 90.4 91.9  PLT 327 359 331    Medications:     (feeding supplement) PROSource Plus  60 mL Oral BID BM   acidophilus  1 capsule Oral Daily   apixaban   2.5 mg Oral BID   vitamin C   1,000 mg Oral Daily   atorvastatin   80 mg Oral Daily   Chlorhexidine  Gluconate Cloth  6 each Topical BID   ferrous sulfate   325 mg Oral Q breakfast   gabapentin   100 mg Oral QHS   Gerhardt's butt cream  1 Application Topical TID   insulin  aspart  0-15 Units Subcutaneous Q4H   insulin  aspart  2 Units Subcutaneous Q4H   insulin  glargine  18 Units Subcutaneous BID   midodrine   5 mg Oral TID WC   multivitamin with minerals  1 tablet Oral q AM   nutrition supplement (JUVEN)  1 packet Oral BID BM   mouth rinse  15 mL Mouth Rinse 4 times per day   pantoprazole   40 mg Oral BID   ramelteon   8 mg Oral QHS   sodium chloride  flush  10-40 mL Intracatheter Q12H   tamsulosin   0.4 mg Oral QPC supper    Gordy Blanch, MD 12/28/2024, 9:27 AM

## 2024-12-28 NOTE — Progress Notes (Signed)
 Speech Language Pathology Daily Session Note  Patient Details  Name: Shane [name] MRN: [id number] Date of Birth: 1941-01-28  Today's Date: 12/28/2024 SLP Individual Time: 1200-1250 SLP Individual Time Calculation (min): 50 min  Short Term Goals: Week 1: SLP Short Term Goal 1 (Week 1): Pt will participate in MBSS to identify least restrictive diet SLP Short Term Goal 2 (Week 1): Pt will solve mildly complex problems w/ supervisionA SLP Short Term Goal 3 (Week 1): Pt will recall recent/relevant information w/ supervisionA  Skilled Therapeutic Interventions:  Pt seen for ST targeting dysphagia goals. Around 0930, OT informed SLP that pt reported difficulty with his breakfast this AM and asked if SLP could see pt prior to scheduled session later in the afternoon. SLP went to see pt with lunch and he was agreeable to moving his ST session. Cortrak in place. NSG and SLP provided repositioning prior to intake. SLP facilitated skilled observation of PO intake to assess tolerance of new Dys3/thin diet following MBSS yesterday. When asked to describe what happened with breakfast, pt described esophageal sx consistent with previous reports. He denied coughing, choking, or difficulty with mastication. Pt reports concerns that Cortrak is negatively impacting his appetite; encouraged him to discuss this concern with his medical team and provided education that often pt has to demonstrate enough PO intake to justify removal. Pt verbalized understanding.   SLP provided set up A for lunch tray of D3 chicken, soup, mashed potatoes, carrots, and pudding with thin liquids via straw. Pt with very limited intake (~10%), stating he did not like the food. No overt s/sx of aspiration observed, mastication was WNL. Globus sensation reported x2 with chicken. SLP provided education on use of alternating solids/liquids, slow rate, small bites/sip, upright position during and 30 min after PO intake, and mixing dry foods with  moist foods. Pt verbalized understanding.  From an oropharyngeal standpoint, pt is safe to continue D3; however, discussed potential downgrade to D2 for comfort given esophageal sx. Extensive discussion was had whether to continue D3 or downgrade to D2. Pt stated he did not want D2 meats but will also likely avoid D3 meats. Discussed that if pt's goal is to increase PO intake for NG removal, then D3 diet is less limiting and pt may have more sides to choose from even though D2 texture is softer and may help reduce esophageal sx. Ultimately settled on continuing D3/thin at this time and that pt can ask NSG/ST to downgrade in the future. Pt left in bed with call bell in reach and bed alarm active. Continue ST POC.  Pain Leg pain - NSG in room and aware  Therapy/Group: Individual Therapy  Waddell JONETTA Novak, MA CCC-SLP 12/28/2024, 2:22 PM

## 2024-12-28 NOTE — Progress Notes (Addendum)
 "                                                        PROGRESS NOTE   Subjective/Complaints: Nausea and vomiting this AM.  Episode of vomiting after medications. Patient to get dialysis as this may be potential cause of his nausea. Nausea is improving. Having some pain at residual limb, just got oxycodone .  LBM today and yesterday   ROS: Patient denies fever, new vision changes, dizziness, chest pain, headache, or mood change. Diarrhea- improved Itching- intermittent Shortness of breath with activity-chronic at baseline + nausea and vomiting  Objective:   DG Swallowing Func-Speech Pathology Result Date: 12/27/2024 Table formatting from the original result was not included. Modified Barium Swallow Study Patient Details Name: Shane [name] MRN: [id number] Date of Birth: 04-25-1941 Today's Date: 12/27/2024 HPI/PMH: HPI: 84 yo male admitted 1/7 for L transtibial amputation. Transferred to ICU 1/8 for hypotension. CRRT started 1/10. Intermittent dialysis started 1/14. Seen by SLP 12/2020 and 12/2023 with concern for primary esophageal dysphagia, complaining of difficulty with solids>liquids and episodes of regurgitation. originally declined an MBS and although recommended, esophageal assessment was not been completed. Remains NPO. PMH: CKD III, DM, HTN, HLD, IPF, OSA on CPAP, HFrEF with LVEF 30-35% Clinical Impression: Oropharyngeal swallow WFL across all consistencies trialed. He presented w/ flash penetration w/ thin and nectar thick liquids, which is WFL given pt's age. No other instances of airway invasion noted. Some esophageal retention was evident, which aligns with pt's complaints. He would benefit from initiation of PO diet: Dysphagia 3 textures and thin liquids. Meds per pt preference. He would benefit from assistance w/ set up of meal only. Pt will likely tolerate upgrade to regular textures quickly, will monitor diet tolerance in upcoming tx sessions. Continue w/ current POC. Factors that  may increase risk of adverse event in presence of aspiration Shane [name] & Shane [name] 2021): No data recorded Recommendations/Plan: Swallowing Evaluation Recommendations Swallowing Evaluation Recommendations Recommendations: PO diet PO Diet Recommendation: Dysphagia 3 (Mechanical soft); Thin liquids (Level 0) Liquid Administration via: Straw Medication Administration: Whole meds with liquid Supervision: Patient able to self-feed; Set-up assistance for safety Postural changes: Position pt fully upright for meals Oral care recommendations: Oral care BID (2x/day) Treatment Plan Treatment Plan Treatment recommendations: Therapy as outlined in treatment plan below Follow-up recommendations: Acute inpatient rehab (3 hours/day) Recommendations Comment: continue w/ current POC Functional status assessment: Patient has had a recent decline in their functional status and demonstrates the ability to make significant improvements in function in a reasonable and predictable amount of time. Treatment frequency: Min 3x/week Treatment duration: 2 weeks Interventions: Aspiration precaution training; Diet toleration management by SLP; Patient/family education; Trials of upgraded texture/liquids Recommendations Recommendations for follow up therapy are one component of a multi-disciplinary discharge planning process, led by the attending physician.  Recommendations may be updated based on patient status, additional functional criteria and insurance authorization. Assessment: Orofacial Exam: Orofacial Exam Oral Cavity: Oral Hygiene: WFL Oral Cavity - Dentition: Adequate natural dentition Orofacial Anatomy: WFL Oral Motor/Sensory Function: WFL Anatomy: Anatomy: Presence of cervical hardware Boluses Administered: Boluses Administered Boluses Administered: Mildly thick liquids (Level 2, nectar thick); Thin liquids (Level 0); Puree; Solid  Oral Impairment Domain: Oral Impairment Domain Lip Closure: No labial escape Tongue control during bolus  hold: Posterior escape of less  than half of bolus Bolus preparation/mastication: Timely and efficient chewing and mashing Bolus transport/lingual motion: Brisk tongue motion Oral residue: Trace residue lining oral structures Location of oral residue : Tongue; Palate Initiation of pharyngeal swallow : Posterior laryngeal surface of the epiglottis  Pharyngeal Impairment Domain: Pharyngeal Impairment Domain Soft palate elevation: No bolus between soft palate (SP)/pharyngeal wall (PW) Laryngeal elevation: Complete superior movement of thyroid cartilage with complete approximation of arytenoids to epiglottic petiole Anterior hyoid excursion: Complete anterior movement Epiglottic movement: Complete inversion Laryngeal vestibule closure: Incomplete, narrow column air/contrast in laryngeal vestibule Pharyngeal stripping wave : Present - complete Pharyngeal contraction (A/P view only): N/A Pharyngoesophageal segment opening: Partial distention/partial duration, partial obstruction of flow Tongue base retraction: Trace column of contrast or air between tongue base and PPW Pharyngeal residue: Trace residue within or on pharyngeal structures Location of pharyngeal residue: Valleculae; Tongue base; Pyriform sinuses  Esophageal Impairment Domain: Esophageal Impairment Domain Esophageal clearance upright position: Esophageal retention Pill: Pill Consistency administered: Thin liquids (Level 0) Thin liquids (Level 0): [facility] Penetration/Aspiration Scale Score: Penetration/Aspiration Scale Score 1.  Material does not enter airway: Puree; Solid 2.  Material enters airway, remains ABOVE vocal cords then ejected out: Mildly thick liquids (Level 2, nectar thick); Thin liquids (Level 0); Pill Compensatory Strategies: Compensatory Strategies Compensatory strategies: Yes Straw: Effective Effective Straw: Thin liquid (Level 0); Mildly thick liquid (Level 2, nectar thick); Pill   General Information: Caregiver present: No  Diet Prior to this  Study: NPO; Cortrak/Small bore NG tube   Temperature : Normal   Respiratory Status: WFL   Supplemental O2: Nasal cannula (2L)   History of Recent Intubation: No  Behavior/Cognition: Lethargic/Drowsy; Alert; Cooperative Self-Feeding Abilities: Able to self-feed Baseline vocal quality/speech: Normal No data recorded Volitional Swallow: Able to elicit No data recorded Goal Planning: Prognosis for improved oropharyngeal function: Good Barriers to Reach Goals: Cognitive deficits No data recorded Patient/Family Stated Goal: eat again Consulted and agree with results and recommendations: Patient Pain: Pain Assessment Pain Assessment: No/denies pain Pain Score: 0 End of Session: Start Time:No data recorded Stop Time: No data recorded Time Calculation:No data recorded Charges: No data recorded SLP visit diagnosis: No data recorded Past Medical History: Past Medical History: Diagnosis Date  Acute blood loss anemia   Anxiety   AV block, Mobitz 1   Cataract   Chronic kidney disease   d/t DM  CKD (chronic kidney disease), stage III (HCC)   COVID-19 virus infection 07/18/2020  Last Assessment & Plan:   Formatting of this note might be different from the original.  Immunocompromise high risk patient  -ID consulted for further therapies and will administer regeneron as OP tomorrow   -Decadron  discontinued as patient is off O2    Depression   Diabetes mellitus   Vgo disposal insulin  bolus  simular to insulin  pump  Dyspnea   GERD (gastroesophageal reflux disease)   History of kidney stones   passed  Hyperlipidemia   Hypertension   Idiopathic pulmonary fibrosis (HCC) 11/2016  ILD (interstitial lung disease) (HCC)   Moderate aortic stenosis   a. 10/2019 Echo: EF 55-60%, Gr2 DD. Nl RV.   Neuromuscular disorder (HCC)   Neuropathy associated with endocrine disorder   Nonobstructive CAD (coronary artery disease)   a. 2012 Cath: mod, nonobs dzs; b. 10/2016 MV: EF 60%, no ischemia.  OSA on CPAP 05/05/2017  Unattended Home Sleep Test  7/2/813-AHI 38.6/hour, desaturation to 64%, body weight 261 pounds  PONV (postoperative nausea and vomiting)   Postoperative anemia  due to acute blood loss 11/07/2020  Postoperative hemorrhagic shock 03/20/2021  Sleep apnea    uses cpap asked to bring mask and tubing Past Surgical History: Past Surgical History: Procedure Laterality Date  ABDOMINAL AORTOGRAM W/LOWER EXTREMITY N/A 12/10/2020  Procedure: ABDOMINAL AORTOGRAM W/LOWER EXTREMITY;  Surgeon: Magda Debby SAILOR, MD;  Location: [facility] INVASIVE CV LAB;  Service: Cardiovascular;  Laterality: N/A;  AMPUTATION Right 01/22/2021  Procedure: 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;  Surgeon: Harden Jerona GAILS, MD;  Location: [facility] OR;  Service: Orthopedics;  Laterality: Right;  AMPUTATION Right 03/17/2021  Procedure: RIGHT BELOW KNEE AMPUTATION;  Surgeon: Harden Jerona GAILS, MD;  Location: [facility] OR;  Service: Orthopedics;  Laterality: Right;  AMPUTATION Left 12/11/2024  Procedure: AMPUTATION BELOW KNEE;  Surgeon: Harden Jerona GAILS, MD;  Location: [facility] OR;  Service: Orthopedics;  Laterality: Left;  ANKLE FUSION Right 01/22/2021  Procedure: RIGHT FOOT TIBIOCALCANEAL FUSION;  Surgeon: Harden Jerona GAILS, MD;  Location: [facility] OR;  Service: Orthopedics;  Laterality: Right;  ANTERIOR FUSION CERVICAL SPINE  2012  APPLICATION OF WOUND VAC Left 12/11/2024  Procedure: APPLICATION, WOUND VAC;  Surgeon: Harden Jerona GAILS, MD;  Location: [facility] OR;  Service: Orthopedics;  Laterality: Left;  CARDIAC CATHETERIZATION  2011  CARDIAC CATHETERIZATION N/A 11/09/2016  Procedure: Right Heart Cath;  Surgeon: Victory LELON Sharps, MD;  Location: [facility] INVASIVE CV LAB;  Service: Cardiovascular;  Laterality: N/A;  carpel tunnel    left wrist  CATARACT EXTRACTION    CATARACT EXTRACTION W/ INTRAOCULAR LENS  IMPLANT, BILATERAL  2013  CERVICAL LAMINECTOMY  2012  COLONOSCOPY N/A 01/14/2013  Procedure: COLONOSCOPY;  Surgeon: Norleen SAILOR Kiang, MD;  Location: [facility] ENDOSCOPY;  Service: Endoscopy;  Laterality: N/A;  CORONARY ARTERY BYPASS GRAFT  11/04/2020  LIMA-LAD, SVG-OM1,  SVG-PDA (Dr Norleen Exon [facility]) dc 11/18/2020  EYE SURGERY    I & D EXTREMITY Right 02/19/2021  Procedure: RIGHT ANKLE DEBRIDEMENT AND PLACEMENT ANTIBIOTIC BEADS;  Surgeon: Harden Jerona GAILS, MD;  Location: [facility] OR;  Service: Orthopedics;  Laterality: Right;  KNEE SURGERY  1998  left  LEFT HEART CATH AND CORONARY ANGIOGRAPHY N/A 07/10/2020  Procedure: LEFT HEART CATH AND CORONARY ANGIOGRAPHY;  Surgeon: Wonda Sharper, MD;  Location: [facility] INVASIVE CV LAB;  Service: Cardiovascular;  Laterality: N/A;  LUMBAR LAMINECTOMY  2003  LUNG BIOPSY Left 12/26/2016  Procedure: LUNG BIOPSY;  Surgeon: Elspeth JAYSON Millers, MD;  Location: [facility] OR;  Service: Thoracic;  Laterality: Left;  PACEMAKER IMPLANT N/A 03/30/2020  Procedure: PACEMAKER IMPLANT;  Surgeon: Waddell Danelle LELON, MD;  Location: [facility] INVASIVE CV LAB;  Service: Cardiovascular;  Laterality: N/A;  PERIPHERAL VASCULAR INTERVENTION Right 12/10/2020  Procedure: PERIPHERAL VASCULAR INTERVENTION;  Surgeon: Magda Debby SAILOR, MD;  Location: [facility] INVASIVE CV LAB;  Service: Cardiovascular;  Laterality: Right;  SFA  POSTERIOR FUSION CERVICAL SPINE  2012  PPM GENERATOR REMOVAL N/A 12/14/2020  Procedure: PPM GENERATOR REMOVAL;  Surgeon: Waddell Danelle LELON, MD;  Location: [facility] INVASIVE CV LAB;  Service: Cardiovascular;  Laterality: N/A;  TEE WITHOUT CARDIOVERSION N/A 12/11/2020  Procedure: TRANSESOPHAGEAL ECHOCARDIOGRAM (TEE);  Surgeon: Barbaraann Darryle Debby, MD;  Location: [facility] ENDOSCOPY;  Service: Cardiovascular;  Laterality: N/A;  TRIGGER FINGER RELEASE  2011  4th finger left hand  VIDEO ASSISTED THORACOSCOPY Left 12/26/2016  Procedure: VIDEO ASSISTED THORACOSCOPY;  Surgeon: Elspeth JAYSON Millers, MD;  Location: [facility] OR;  Service: Thoracic;  Laterality: Left;  VIDEO BRONCHOSCOPY N/A 12/26/2016  Procedure: VIDEO BRONCHOSCOPY;  Surgeon: Elspeth JAYSON Millers, MD;  Location: [facility] OR;  Service: Thoracic;  Laterality: N/A; Recardo  A Arenz 12/27/2024, 9:34 PM  No results for input(s): WBC, HGB, HCT, PLT in the last 72  hours.  Recent Labs    12/27/24 0302 12/28/24 0257  NA 131* 131*  K 5.6* 5.6*  CL 93* 93*  CO2 29 29  GLUCOSE 161* 119*  BUN 83* 93*  CREATININE 2.70* 2.84*  CALCIUM  9.1 8.6*    Intake/Output Summary (Last 24 hours) at 12/28/2024 1127 Last data filed at 12/28/2024 0921 Gross per 24 hour  Intake 594 ml  Output 350 ml  Net 244 ml        Physical Exam: Vital Signs Blood pressure (!) 113/55, pulse (!) 59, temperature 97.8 F (36.6 C), temperature source Oral, resp. rate 18, height 5' (1.524 m), weight 80 kg, SpO2 100%.  General: No apparent distress, obese, laying in bed, just finished with therapy HEENT: Head is normocephalic, atraumatic, + NG tube, +[place] 2L Neck: Supple without JVD or lymphadenopathy Heart: Reg rate and rhythm.  Chest: CTA bilaterally, R internal jugular TDC Abdomen: Soft, non-tender, non-distended, bowel sounds positive. Extremities: Left BKA is dressed with wound VAC about 200 mL in the canister.  Right BKA well-healed.  Psych: Pt's affect is appropriate. Pt is cooperative Skin: bruising noted on Ues, warm and dry Neuro:   Alert and awake, follows simple commands, CN 2-12 grossly intact, moving all 4 extremities to gravity     Assessment/Plan: 1. Functional deficits which require 3+ hours per day of interdisciplinary therapy in a comprehensive inpatient rehab setting. Physiatrist is providing close team supervision and 24 hour management of active medical problems listed below. Physiatrist and rehab team continue to assess barriers to discharge/monitor patient progress toward functional and medical goals  Care Tool:  Bathing    Body parts bathed by patient: Right arm, Left arm, Chest, Abdomen, Face, Front perineal area   Body parts bathed by helper: Buttocks, Right upper leg, Left upper leg, Right lower leg Body parts n/a: Left lower leg   Bathing assist Assist Level: Maximal Assistance - Patient 24 - 49%     Upper Body  Dressing/Undressing Upper body dressing   What is the patient wearing?: Hospital gown only    Upper body assist Assist Level: Minimal Assistance - Patient > 75%    Lower Body Dressing/Undressing Lower body dressing      What is the patient wearing?: Hospital gown only     Lower body assist Assist for lower body dressing: Dependent - Patient 0%     Toileting Toileting    Toileting assist Assist for toileting: Dependent - Patient 0%     Transfers Chair/bed transfer  Transfers assist  Chair/bed transfer activity did not occur: Safety/medical concerns (weakness/fatigue/pain/nausea)        Locomotion Ambulation   Ambulation assist   Ambulation activity did not occur: Safety/medical concerns (B BKA)          Walk 10 feet activity   Assist  Walk 10 feet activity did not occur: Safety/medical concerns (B BKA)        Walk 50 feet activity   Assist Walk 50 feet with 2 turns activity did not occur: Safety/medical concerns (B BKA)         Walk 150 feet activity   Assist Walk 150 feet activity did not occur: Safety/medical concerns (B BKA)         Walk 10 feet on uneven surface  activity   Assist Walk 10 feet on uneven surfaces activity did not occur: Safety/medical concerns (B  BKA)         Wheelchair     Assist Is the patient using a wheelchair?: Yes Type of Wheelchair: Manual Wheelchair activity did not occur: Safety/medical concerns (weakness/fatigue/pain/nausea)         Wheelchair 50 feet with 2 turns activity    Assist    Wheelchair 50 feet with 2 turns activity did not occur: Safety/medical concerns (weakness/fatigue/pain/nausea)       Wheelchair 150 feet activity     Assist  Wheelchair 150 feet activity did not occur: Safety/medical concerns (weakness/fatigue/pain/nausea)       Blood pressure (!) 113/55, pulse (!) 59, temperature 97.8 F (36.6 C), temperature source Oral, resp. rate 18, height 5' (1.524 m),  weight 80 kg, SpO2 100%.  Medical Problem List and Plan: 1. 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  Antithrombotics: -DVT/anticoagulation:  Pharmaceutical: Eliquis              -antiplatelet therapy: N/A 3. Pain Management: Neurontin  100 mg nightly, oxycodone  as needed, Robaxin  as needed  - 1/24 continue as needed oxycodone  for pain control 4. Mood/Behavior/Sleep: Rozerem  8 mg nightly, trazodone  75 mg nightly as needed             -antipsychotic agents: N/A 5. Neuropsych/cognition: This patient is not capable of making decisions on his own behalf. 6. Skin/Wound Care: Routine skin checks 7. Fluids/Electrolytes/Nutrition: Routine and analysis with follow-up chemistries 8.  History of right BKA 2022.  Patient received CIR.  Intermittent use of prosthesis 9.  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  CRRT 1/10 - 1/12 transition to intermittent hemodialysis.  Follow-up renal services  -1/22-23  reviewed nephrology note, monitoring for need for dialysis 10.  Paroxysmal atrial fibrillation.  Continue Eliquis .    - 1/24 patient did have vomiting after Eliquis , discussed with pharmacy we will hold off on redosing    12/28/2024    3:23 AM 12/27/2024    7:59 PM 12/27/2024    3:01 PM  Vitals with BMI  Systolic 113 104 882  Diastolic 55 53 38  Pulse 59 67 60    11.  Acute on chronic anemia.  Follow-up CBC.  Continue iron  supplement  - Recheck tomorrow morning 12.  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  NovoLog  4 units every 4 hours, Lantus  insulin  18 units twice daily. -1/22 had mild hypoglycemia yesterday, decrease novolog  to 2 units Q4h -1/23-24 stable continue current regimen  monitor CBG (last 3)  Recent Labs    12/28/24 0005 12/28/24 0314 12/28/24 0827  GLUCAP 150* 107* 111*    13.  Hypotension.  ProAmatine  5 mg every 8 hours.  Monitor with increased mobility  - 1/23-24 BP soft but overall stable continue to monitor    12/28/2024    3:23 AM 12/27/2024    7:59 PM 12/27/2024    3:01 PM  Vitals with BMI  Systolic 113 104 882  Diastolic 55 53 38  Pulse 59 67 60    14.  Chronic systolic congestive heart failure.  Echocardiogram with ejection fraction of  30 to 35%.  Monitor for any signs of fluid overload. Daily weights -1/23 no signs of overload noted, difficult to assess weight trend suspect inaccurate readings -1/24 does not appear to have signs of fluid overload, dialysis today per nephrology  Filed Weights   12/26/24 0450 12/26/24 0630 12/27/24 0430  Weight: 106.1 kg 106.1 kg 80 kg     15.  CAD/AV block/Mobitz 1/aortic stenosis/CABG 2021.  Follow-up cardiology services. 16.  Idiopathic pulmonary fibrosis/OSA.  Followed by Dr. Geronimo.  Patient with 2 L oxygen intermittently at home.  Currently not on CPAP until tube feeds discontinued   17.  Oropharyngeal dysphagia.  Follow-up speech therapy.  Currently with Cortrak feeding tube for nutritional support.  Advance diet as tolerated. Currently NPO with ice chips allowed  - 1/23 diet was upgraded today to D3 thin by SLP   18.  Diarrhea.  Related to tube feeds.  Flexi-Seal in place: continue Continue Imodium .  -1/23 LBM 1/21, improved continue to follow-up  -1/24 LBM today and yesterday   19.  GERD: continue Protonix    20.  Hyperlipidemia: continue Lipitor   21. Itching.  -Continue atarax  PRN  -sarna PRN  22.  Hyperkalemia, hyponatremia -1/23 nephrology following has ordered Lokelma , appreciate assistance, recheck for tomorrow  23. Nausea  -hold tube feeds for now, nephrology planning dialysis today as this may be potential cause.  Abdominal x-ray ordered    LOS: 3 days A FACE TO FACE  EVALUATION WAS PERFORMED  Murray Collier 12/28/2024, 11:27 AM     "

## 2024-12-28 NOTE — Progress Notes (Signed)
 Pt seen on routine central line rounds; he has a right internal jugular hemodialysis catheter, placed 12-14-24; dressing is due to be changed today, and the RN has confirmed the pt is going for dialysis today;  dressing to be changed while he is there.

## 2024-12-28 NOTE — Progress Notes (Signed)
 Received patient in bed to unit.  Alert and oriented.  Informed consent signed and in chart.   TX duration: 3 hours  Patient tolerated well.  Transported back to the room  Alert, without acute distress.  Hand-off given to patient's nurse.   Access used: R HD cath chest Access issues: none  Total UF removed: Medication(s) given: oxycodone  and 100 NS bolus   12/28/24 1900  Vitals  Temp 98.2 F (36.8 C)  BP (!) 117/53  MAP (mmHg) 72  Pulse Rate 60  ECG Heart Rate 61  Resp 14  Weight 97.7 kg  Type of Weight Post-Dialysis  Oxygen Therapy  SpO2 100 %  O2 Device Nasal Cannula  During Treatment Monitoring  Duration of HD Treatment -hour(s) 3 hour(s)  HD Safety Checks Performed Yes  Intra-Hemodialysis Comments Tx completed  Post Treatment  Dialyzer Clearance Lightly streaked  Liters Processed 71.9  Fluid Removed (mL) 300 mL  Tolerated HD Treatment No (Comment)  Post-Hemodialysis Comments Patient had cramp at end of session  Hemodialysis Catheter Right Internal jugular  Placement Date/Time: 12/14/24 1300   Placed prior to admission: No  Time Out: Correct patient;Correct site;Correct procedure  Maximum sterile barrier precautions: Hand hygiene;Cap;Mask;Sterile gown;Sterile gloves;Large sterile sheet;Sterile probe cove...  Site Condition No complications  Blue Lumen Status Flushed;Antimicrobial dead end cap;Heparin  locked  Red Lumen Status Flushed;Antimicrobial dead end cap;Heparin  locked  Purple Lumen Status N/A  Catheter fill solution Heparin  1000 units/ml  Catheter fill volume (Arterial) 1.2 cc  Catheter fill volume (Venous) 1.2  Dressing Type Transparent  Dressing Status Antimicrobial disc/dressing in place;Clean, Dry, Intact  Interventions Dressing changed;Antimicrobial disc changed  Drainage Description None  Dressing Change Due 01/04/25     Camellia Brasil LPN [facility]

## 2024-12-28 NOTE — Progress Notes (Addendum)
 Patient had emesis episode quickly post daily medicine pass;unable to determine medication lost. Eliquis  and PRN oxycodone  included in med pass. MD notified, no further action and may repeat PRN oxy for 9/10 L leg pain

## 2024-12-28 NOTE — Progress Notes (Signed)
 Physical Therapy Session Note  Patient Details  Name: Shane [name] MRN: [id number] Date of Birth: [birth year]  Today's Date: 12/28/2024 PT Individual Time: 8954-8843 PT Individual Time Calculation (min): 71 min   Short Term Goals: Week 1:  PT Short Term Goal 1 (Week 1): Pt will perform supine to sit transfers with min A PT Short Term Goal 2 (Week 1): Pt will perform sit to supine transfers with min A PT Short Term Goal 3 (Week 1): Pt will maintain sitting balance EOB with CGA PT Short Term Goal 4 (Week 1): Pt will perform bed <> WC transfers with max A  Skilled Therapeutic Interventions/Progress Updates:   Received pt semi-reclined in bed with RN at bedside administering medications. Pt on 2L O2 with SPO2 98% but pt still reporting intermittent SOB throughout session. Pt agreeable to PT treatment and reported pain 9/10 in L residual limb (premedicated). Session with emphasis on functional mobility/transfers, generalized strengthening and endurance, limb loss education, and positioning in WC. While RN administered medications, therapist attempted to don R gel liner and prosthetic in supine with total A, however pin would not click in. Pt then suddenly experienced multiple emesis episodes and reporting pills felt stuck in his throat. Provided pt with cool washcloth and helped clean pt's face, then transferred to sitting L EOB with HOB elevated and use of bedrails with mod A for trunk control. ++ time spent sitting on EOB but eventually able to get prosthetic clicked in but required ++ time due to interruptions with emesis episodes. Performed slideboard transfer into TIS WC with min/mod A +2 and cues for technique and hand placement.   Transported to/from room in TIS WC dependently and built up L elevating legrest with towels, foam, and coband for improved positioning and adjusted limb guard. Educated pt on tilt feature of TIS WC for pressure relief. Returned to room and returned to bed due to  pain - performed slideboard transfer TIS WC<>bed with mod A +2 due to fatigue and cues for technique. Removed prosthetic and limb guard and transitioned into supine with mod A for BLE management. Scooted to [facility] with +2 assist and MD arrived for morning rounds. Concluded session with pt semi-reclined in bed with all needs within reach.   Therapy Documentation Precautions:  Precautions Precautions: Fall Recall of Precautions/Restrictions: Intact Precaution/Restrictions Comments: watch BP wound VAC, LLE splint Restrictions Weight Bearing Restrictions Per Provider Order: Yes LLE Weight Bearing Per Provider Order: Non weight bearing  Therapy/Group: Individual Therapy Therisa HERO Zaunegger Therisa Stains PT, DPT 12/28/2024, 6:48 AM

## 2024-12-28 NOTE — Progress Notes (Signed)
 Occupational Therapy Session Note  Patient Details  Name: Wilfrido Luedke MRN: [id number] Date of Birth: 1941/10/02  Today's Date: 12/28/2024 OT Individual Time: 9183-9069 OT Individual Time Calculation (min): 74 min    Short Term Goals: Week 1:  OT Short Term Goal 1 (Week 1): Patient to perform bed level LE dressing with Mod assist OT Short Term Goal 2 (Week 1): Patient to perform [facility] transfer with Max of one OT Short Term Goal 3 (Week 1): Patient to perform bathing at bed level with Min assist  Skilled Therapeutic Interventions/Progress Updates:   Patient agreeable to participate in OT session. Reports 0/10 pain level, however still reporting fatigue, nausea. Patient received food shortly after OT arrived. OT assisted patient in bed mobility min to mod A to increase ability to eat and utilize utensils successfully. Patient reported increased trouble with swallowing today. Patient then placed on EOB to increase upright sitting due to sliding in HOB elevated sitting. Patient still requiring water following swallowing, however coughing to clear. Patient then returned to bed, OT provided tilt in space wc for patient to increase ability to tolerate upright sitting due to weakness, poor activity tolerance, and pain in buttocks due to wound formation. Patient then completed UB bathing and dressing CG to min A on EOB for increased safety with dialysis access and feeding tube. Patient max to total A with LB dressing/ bathing/ toileting. Patient left in bed all needs in reach     Therapy Documentation Precautions:  Precautions Precautions: Fall Recall of Precautions/Restrictions: Intact Precaution/Restrictions Comments: watch BP wound VAC, LLE splint Restrictions Weight Bearing Restrictions Per Provider Order: Yes LLE Weight Bearing Per Provider Order: Non weight bearing  Therapy/Group: Individual Therapy  [name] Damonte Frieson 12/28/2024, 6:57 AM

## 2024-12-29 DIAGNOSIS — G4733 Obstructive sleep apnea (adult) (pediatric): Secondary | ICD-10-CM

## 2024-12-29 DIAGNOSIS — R1312 Dysphagia, oropharyngeal phase: Secondary | ICD-10-CM

## 2024-12-29 LAB — GLUCOSE, CAPILLARY
Glucose-Capillary: 146 mg/dL — ABNORMAL HIGH (ref 70–99)
Glucose-Capillary: 148 mg/dL — ABNORMAL HIGH (ref 70–99)
Glucose-Capillary: 178 mg/dL — ABNORMAL HIGH (ref 70–99)
Glucose-Capillary: 140 mg/dL — ABNORMAL HIGH (ref 70–99)
Glucose-Capillary: 185 mg/dL — ABNORMAL HIGH (ref 70–99)
Glucose-Capillary: 155 mg/dL — ABNORMAL HIGH (ref 70–99)

## 2024-12-29 LAB — CBC
HCT: 23.4 % — ABNORMAL LOW (ref 39.0–52.0)
Hemoglobin: 7.8 g/dL — ABNORMAL LOW (ref 13.0–17.0)
MCH: 30.2 pg (ref 26.0–34.0)
MCHC: 33.3 g/dL (ref 30.0–36.0)
MCV: 90.7 fL (ref 80.0–100.0)
Platelets: 266 10*3/uL (ref 150–400)
RBC: 2.58 MIL/uL — ABNORMAL LOW (ref 4.22–5.81)
RDW: 14.5 % (ref 11.5–15.5)
WBC: 9.8 10*3/uL (ref 4.0–10.5)
nRBC: 0 % (ref 0.0–0.2)

## 2024-12-29 LAB — RENAL FUNCTION PANEL
Albumin: 3.1 g/dL — ABNORMAL LOW (ref 3.5–5.0)
Anion gap: 8 (ref 5–15)
BUN: 45 mg/dL — ABNORMAL HIGH (ref 8–23)
CO2: 31 mmol/L (ref 22–32)
Calcium: 8.4 mg/dL — ABNORMAL LOW (ref 8.9–10.3)
Chloride: 94 mmol/L — ABNORMAL LOW (ref 98–111)
Creatinine, Ser: 1.93 mg/dL — ABNORMAL HIGH (ref 0.61–1.24)
GFR, Estimated: 34 mL/min — ABNORMAL LOW
Glucose, Bld: 133 mg/dL — ABNORMAL HIGH (ref 70–99)
Phosphorus: 3.6 mg/dL (ref 2.5–4.6)
Potassium: 4.9 mmol/L (ref 3.5–5.1)
Sodium: 132 mmol/L — ABNORMAL LOW (ref 135–145)

## 2024-12-29 NOTE — Progress Notes (Addendum)
 "                                                        PROGRESS NOTE   Subjective/Complaints: Nausea and vomiting has improved this morning.  Breathing treatments as needed have been helpful for shortness of breath.  He does have some shortness of breath when he wakes up in the morning.  Has history of CPAP use.  Nursing note indicates bed was changed yesterday, patient had issues with a low-air-loss mattress.   LBM today   ROS: Patient denies fever, new vision changes, dizziness, chest pain, headache, or mood change. Diarrhea- improved Itching- intermittent Shortness of breath with activity-chronic at baseline + nausea and vomiting-improved  Objective:   DG Abd 2 Views Result Date: 12/28/2024 EXAM: 2 VIEW XRAY OF THE ABDOMEN 12/28/2024 10:17:00 AM COMPARISON: 12/24/2024 CLINICAL HISTORY: Nausea. FINDINGS: LINES, TUBES AND DEVICES: The enteric tube is present with the tip projecting over the right upper quadrant, consistent with a location in the second portion of the duodenum. Vascular graft in the right femoral region. BOWEL: Nonobstructive bowel gas pattern. There is residual contrast material throughout the decompressed colon. No small or large bowel distention. SOFT TISSUES: No abnormal calcifications. No radiopaque stones. BONES: Degenerative changes in the spine and hips. No acute fracture. VISUALIZED CHEST: Postoperative changes in the mediastinum. Probable bilateral pleural effusions with basilar atelectasis infiltrates. IMPRESSION: 1. No small or large bowel distention to suggest bowel obstruction or ileus. 2. Enteric tube tip projects over the right upper quadrant, consistent with location in the second portion of the duodenum. Electronically signed by: Elsie Gravely MD 12/28/2024 04:11 PM EST RP Workstation: [id number]   Recent Labs    12/29/24 0512  WBC 9.8  HGB 7.8*  HCT 23.4*  PLT 266    Recent Labs    12/28/24 0257 12/29/24 0512  NA 131* 132*  K 5.6* 4.9  CL 93*  94*  CO2 29 31  GLUCOSE 119* 133*  BUN 93* 45*  CREATININE 2.84* 1.93*  CALCIUM  8.6* 8.4*    Intake/Output Summary (Last 24 hours) at 12/29/2024 1245 Last data filed at 12/29/2024 0830 Gross per 24 hour  Intake 238 ml  Output 575 ml  Net -337 ml        Physical Exam: Vital Signs Blood pressure 106/61, pulse 90, temperature 98.5 F (36.9 C), temperature source Oral, resp. rate 18, height 5' (1.524 m), weight 97.7 kg, SpO2 94%.  General: No apparent distress, obese, laying in bed HEENT: Head is normocephalic, atraumatic, + NG tube, +[place] 2L Neck: Supple without JVD or lymphadenopathy Heart: Reg rate and rhythm.  Chest: CTA bilaterally, R internal jugular TDC.  Nonlabored breathing Abdomen: Soft, non-tender, non-distended, bowel sounds positive. Extremities: Left BKA is dressed with wound VAC about 200 mL in the canister.  Right BKA well-healed.  Psych: Pt's affect is appropriate. Pt is cooperative Skin: bruising noted on Ues, warm and dry Neuro:   Alert and awake, follows simple commands, CN 2-12 grossly intact, moving all 4 extremities to gravity     Assessment/Plan: 1. Functional deficits which require 3+ hours per day of interdisciplinary therapy in a comprehensive inpatient rehab setting. Physiatrist is providing close team supervision and 24 hour management of active medical problems listed below. Physiatrist and rehab team continue to assess barriers to  discharge/monitor patient progress toward functional and medical goals  Care Tool:  Bathing    Body parts bathed by patient: Right arm, Left arm, Chest, Abdomen, Face, Front perineal area   Body parts bathed by helper: Buttocks, Right upper leg, Left upper leg, Right lower leg Body parts n/a: Left lower leg   Bathing assist Assist Level: Maximal Assistance - Patient 24 - 49%     Upper Body Dressing/Undressing Upper body dressing   What is the patient wearing?: Hospital gown only    Upper body assist Assist  Level: Minimal Assistance - Patient > 75%    Lower Body Dressing/Undressing Lower body dressing      What is the patient wearing?: Hospital gown only     Lower body assist Assist for lower body dressing: Dependent - Patient 0%     Toileting Toileting    Toileting assist Assist for toileting: Dependent - Patient 0%     Transfers Chair/bed transfer  Transfers assist  Chair/bed transfer activity did not occur: Safety/medical concerns (weakness/fatigue/pain/nausea)  Chair/bed transfer assist level: 2 Helpers (slideboard)     Locomotion Ambulation   Ambulation assist   Ambulation activity did not occur: Safety/medical concerns (B BKA)          Walk 10 feet activity   Assist  Walk 10 feet activity did not occur: Safety/medical concerns (B BKA)        Walk 50 feet activity   Assist Walk 50 feet with 2 turns activity did not occur: Safety/medical concerns (B BKA)         Walk 150 feet activity   Assist Walk 150 feet activity did not occur: Safety/medical concerns (B BKA)         Walk 10 feet on uneven surface  activity   Assist Walk 10 feet on uneven surfaces activity did not occur: Safety/medical concerns (B BKA)         Wheelchair     Assist Is the patient using a wheelchair?: Yes Type of Wheelchair: Manual Wheelchair activity did not occur: Safety/medical concerns (weakness/fatigue/pain/nausea)         Wheelchair 50 feet with 2 turns activity    Assist    Wheelchair 50 feet with 2 turns activity did not occur: Safety/medical concerns (weakness/fatigue/pain/nausea)       Wheelchair 150 feet activity     Assist  Wheelchair 150 feet activity did not occur: Safety/medical concerns (weakness/fatigue/pain/nausea)       Blood pressure 106/61, pulse 90, temperature 98.5 F (36.9 C), temperature source Oral, resp. rate 18, height 5' (1.524 m), weight 97.7 kg, SpO2 94%.  Medical Problem List and Plan: 1. 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  Antithrombotics: -DVT/anticoagulation:  Pharmaceutical: Eliquis              -antiplatelet therapy: N/A 3. Pain Management: Neurontin  100 mg nightly, oxycodone  as needed, Robaxin  as needed  - 1/24 continue as needed oxycodone  for pain control 4. Mood/Behavior/Sleep: Rozerem  8 mg nightly, trazodone  75 mg nightly as needed             -  antipsychotic agents: N/A 5. Neuropsych/cognition: This patient is not capable of making decisions on his own behalf. 6. Skin/Wound Care: Routine skin checks 7. Fluids/Electrolytes/Nutrition: Routine and analysis with follow-up chemistries 8.  History of right BKA 2022.  Patient received CIR.  Intermittent use of prosthesis 9.  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  CRRT 1/10 - 1/12 transition to intermittent hemodialysis.  Follow-up renal services  -1/22-23  reviewed nephrology note, monitoring for need for dialysis 10.  Paroxysmal atrial fibrillation.  Continue Eliquis .    - 1/24 patient did have vomiting after Eliquis , discussed with pharmacy we will hold off on redosing  -1/25 nausea improved, got his Eliquis  today    12/29/2024    4:39 AM 12/28/2024    7:56 PM 12/28/2024    7:15 PM  Vitals with BMI  Systolic 106 99 121  Diastolic 61 70 49  Pulse 90 65 51    11.  Acute on chronic anemia.  Follow-up CBC.  Continue iron  supplement  - 1/25 Hemoglobin lower at 7.8, recheck tomorrow  12.  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  NovoLog  4 units every 4 hours, Lantus  insulin  18 units twice daily. -1/22 had mild hypoglycemia yesterday, decrease novolog  to 2 units Q4h -1/23-25 stable continue current regimen monitor CBG (last 3)  Recent  Labs    12/29/24 0426 12/29/24 0924 12/29/24 1148  GLUCAP 148* 178* 140*    13.  Hypotension.  ProAmatine  5 mg every 8 hours.  Monitor with increased mobility  - 1/23-25 BP soft but overall stable continue to monitor    12/29/2024    4:39 AM 12/28/2024    7:56 PM 12/28/2024    7:15 PM  Vitals with BMI  Systolic 106 99 121  Diastolic 61 70 49  Pulse 90 65 51    14.  Chronic systolic congestive heart failure.  Echocardiogram with ejection fraction of 30 to 35%.  Monitor for any signs of fluid overload. Daily weights -1/23 no signs of overload noted, difficult to assess weight trend suspect inaccurate readings -1/24 does not appear to have signs of fluid overload, dialysis today per nephrology   [facility] Weights   12/27/24 0430 12/28/24 1536 12/28/24 1900  Weight: 80 kg 98 kg 97.7 kg     15.  CAD/AV block/Mobitz 1/aortic stenosis/CABG 2021.  Follow-up cardiology services. 16.  Idiopathic pulmonary fibrosis/OSA.  Followed by Dr. Geronimo.  Patient with 2 L oxygen intermittently at home.  Currently not on CPAP until tube feeds discontinued  - 1/25 consider restarting CPAP if NG tube removed, breathing treatments helping   17.  Oropharyngeal dysphagia.  Follow-up speech therapy.  Currently with Cortrak feeding tube for nutritional support.  Advance diet as tolerated. Currently NPO with ice chips allowed  - 1/23 diet was upgraded today to D3 thin by SLP  -1/25 will hold tube feedings(pt was declining also), patient would like to try eating orally especially now that nausea is improved.  Monitor intake could consider removing tube tomorrow if doing well.   18.  Diarrhea.  Related to tube feeds.  Flexi-Seal in place: continue Continue Imodium .  -1/23 LBM 1/21, improved continue to follow-up  -1/25 LBM today improved overall   19.  GERD: continue Protonix    20.  Hyperlipidemia: continue Lipitor   21. Itching.  -Continue atarax  PRN  -sarna PRN  22.  Hyperkalemia,  hyponatremia -1/23 nephrology following has ordered Lokelma , appreciate assistance improved  23. Nausea  -hold tube feeds for now, nephrology planning  dialysis today as this may be potential cause.  Abdominal x-ray ordered-no obstruction/ileus    -1/25 nausea has improved, continue current regimen  LOS: 4 days A FACE TO FACE EVALUATION WAS PERFORMED  Murray Collier 12/29/2024, 12:45 PM     "

## 2024-12-29 NOTE — Plan of Care (Signed)
  Problem: RH SKIN INTEGRITY Goal: RH STG SKIN FREE OF INFECTION/BREAKDOWN Description: Manage skin free of infection with min assistance Outcome: Progressing   Problem: RH SAFETY Goal: RH STG ADHERE TO SAFETY PRECAUTIONS W/ASSISTANCE/DEVICE Description: STG Adhere to Safety Precautions With min Assistance/Device. Outcome: Progressing   Problem: RH PAIN MANAGEMENT Goal: RH STG PAIN MANAGED AT OR BELOW PT'S PAIN GOAL Description: <4 w/ prns Outcome: Progressing

## 2024-12-29 NOTE — Progress Notes (Incomplete)
 Notes stating to continue holding feeds until after abd xray results came back. Per day shift nurse

## 2024-12-29 NOTE — Progress Notes (Signed)
 Patient ID: Shane [name], male   DOB: 08/16/1941, 84 y.o.   MRN: [id number] [facility] Progress Note   Assessment/ Plan:   1. Acute kidney Injury on chronic kidney disease stage III: Secondary to ATN with dependency on CRRT between 1/10 - 1/12 and then transitioned onto intermittent hemodialysis on MWF schedule. He underwent hemodialysis yesterday (1/24) after previous dialysis on Wednesday 1/20 to try and see if we could improve his nausea by improving azotemia.  Limited ultrafiltration with hemodialysis yesterday so as to not impair renal recovery.  He is nonoliguric and we will continue lab surveillance for possible renal recovery versus conversion of his right temporary dialysis catheter to tunneled access. 2.  Septic shock: Secondary to gangrene of left foot status post left BKA on 12/11/2024.  Transitioned to midodrine  after previously requiring pressors.  Completed Zyvox  and Zosyn . 3.  Paroxysmal atrial fibrillation: Rate controlled with ongoing anticoagulation with Eliquis . 4.  Anemia: Secondary to chronic illness and recent exacerbation by postop losses.  Will continue to monitor to determine need for adjustment of ESA therapy. 5.  Status post left below-knee amputation: With evidence of peripheral vascular disease and gangrene of left foot.  Subjective:   Tolerated dialysis without problems yesterday.  Problems with bed discomfort noted overnight as well as refusal of tube feeds.   Objective:   BP 106/61 (BP Location: Left Arm)   Pulse 90   Temp 98.5 F (36.9 C) (Oral)   Resp 18   Ht 5' (1.524 m)   Wt 97.7 kg   SpO2 94%   BMI 42.07 kg/m   Intake/Output Summary (Last 24 hours) at 12/29/2024 0740 Last data filed at 12/29/2024 0449 Gross per 24 hour  Intake 360 ml  Output 575 ml  Net -215 ml   Weight change:   Physical Exam: Gen: Sitting up in bed, being assisted with breakfast by his wife CVS: Pulse regular rhythm, normal rate, S1 and S2 normal Resp: Clear  to auscultation bilaterally, no rales/rhonchi.  Right IJ temporary dialysis catheter Abd: Soft, obese, nontender, bowel sounds normal Ext: Status post left BKA with wound VAC in situ.  Status post prior right BKA.  Imaging: DG Abd 2 Views Result Date: 12/28/2024 EXAM: 2 VIEW XRAY OF THE ABDOMEN 12/28/2024 10:17:00 AM COMPARISON: 12/24/2024 CLINICAL HISTORY: Nausea. FINDINGS: LINES, TUBES AND DEVICES: The enteric tube is present with the tip projecting over the right upper quadrant, consistent with a location in the second portion of the duodenum. Vascular graft in the right femoral region. BOWEL: Nonobstructive bowel gas pattern. There is residual contrast material throughout the decompressed colon. No small or large bowel distention. SOFT TISSUES: No abnormal calcifications. No radiopaque stones. BONES: Degenerative changes in the spine and hips. No acute fracture. VISUALIZED CHEST: Postoperative changes in the mediastinum. Probable bilateral pleural effusions with basilar atelectasis infiltrates. IMPRESSION: 1. No small or large bowel distention to suggest bowel obstruction or ileus. 2. Enteric tube tip projects over the right upper quadrant, consistent with location in the second portion of the duodenum. Electronically signed by: Elsie Gravely MD 12/28/2024 04:11 PM EST RP Workstation: [id number]   DG Swallowing Func-Speech Pathology Result Date: 12/27/2024 Table formatting from the original result was not included. Modified Barium Swallow Study Patient Details Name: Shane [name] MRN: [id number] Date of Birth: June 22, 1941 Today's Date: 12/27/2024 HPI/PMH: HPI: 84 yo male admitted 1/7 for L transtibial amputation. Transferred to ICU 1/8 for hypotension. CRRT started 1/10. Intermittent dialysis started 1/14. Seen by SLP 12/2020  and 12/2023 with concern for primary esophageal dysphagia, complaining of difficulty with solids>liquids and episodes of regurgitation. originally declined an MBS and although  recommended, esophageal assessment was not been completed. Remains NPO. PMH: CKD III, DM, HTN, HLD, IPF, OSA on CPAP, HFrEF with LVEF 30-35% Clinical Impression: Oropharyngeal swallow WFL across all consistencies trialed. He presented w/ flash penetration w/ thin and nectar thick liquids, which is WFL given pt's age. No other instances of airway invasion noted. Some esophageal retention was evident, which aligns with pt's complaints. He would benefit from initiation of PO diet: Dysphagia 3 textures and thin liquids. Meds per pt preference. He would benefit from assistance w/ set up of meal only. Pt will likely tolerate upgrade to regular textures quickly, will monitor diet tolerance in upcoming tx sessions. Continue w/ current POC. Factors that may increase risk of adverse event in presence of aspiration [name]: No data recorded Recommendations/Plan: Swallowing Evaluation Recommendations Swallowing Evaluation Recommendations Recommendations: PO diet PO Diet Recommendation: Dysphagia 3 (Mechanical soft); Thin liquids (Level 0) Liquid Administration via: Straw Medication Administration: Whole meds with liquid Supervision: Patient able to self-feed; Set-up assistance for safety Postural changes: Position pt fully upright for meals Oral care recommendations: Oral care BID (2x/day) Treatment Plan Treatment Plan Treatment recommendations: Therapy as outlined in treatment plan below Follow-up recommendations: Acute inpatient rehab (3 hours/day) Recommendations Comment: continue w/ current POC Functional status assessment: Patient has had a recent decline in their functional status and demonstrates the ability to make significant improvements in function in a reasonable and predictable amount of time. Treatment frequency: Min 3x/week Treatment duration: 2 weeks Interventions: Aspiration precaution training; Diet toleration management by SLP; Patient/family education; Trials of upgraded texture/liquids  Recommendations Recommendations for follow up therapy are one component of a multi-disciplinary discharge planning process, led by the attending physician.  Recommendations may be updated based on patient status, additional functional criteria and insurance authorization. Assessment: Orofacial Exam: Orofacial Exam Oral Cavity: Oral Hygiene: WFL Oral Cavity - Dentition: Adequate natural dentition Orofacial Anatomy: WFL Oral Motor/Sensory Function: WFL Anatomy: Anatomy: Presence of cervical hardware Boluses Administered: Boluses Administered Boluses Administered: Mildly thick liquids (Level 2, nectar thick); Thin liquids (Level 0); Puree; Solid  Oral Impairment Domain: Oral Impairment Domain Lip Closure: No labial escape Tongue control during bolus hold: Posterior escape of less than half of bolus Bolus preparation/mastication: Timely and efficient chewing and mashing Bolus transport/lingual motion: Brisk tongue motion Oral residue: Trace residue lining oral structures Location of oral residue : Tongue; Palate Initiation of pharyngeal swallow : Posterior laryngeal surface of the epiglottis  Pharyngeal Impairment Domain: Pharyngeal Impairment Domain Soft palate elevation: No bolus between soft palate (SP)/pharyngeal wall (PW) Laryngeal elevation: Complete superior movement of thyroid cartilage with complete approximation of arytenoids to epiglottic petiole Anterior hyoid excursion: Complete anterior movement Epiglottic movement: Complete inversion Laryngeal vestibule closure: Incomplete, narrow column air/contrast in laryngeal vestibule Pharyngeal stripping wave : Present - complete Pharyngeal contraction (A/P view only): N/A Pharyngoesophageal segment opening: Partial distention/partial duration, partial obstruction of flow Tongue base retraction: Trace column of contrast or air between tongue base and PPW Pharyngeal residue: Trace residue within or on pharyngeal structures Location of pharyngeal residue: Valleculae;  Tongue base; Pyriform sinuses  Esophageal Impairment Domain: Esophageal Impairment Domain Esophageal clearance upright position: Esophageal retention Pill: Pill Consistency administered: Thin liquids (Level 0) Thin liquids (Level 0): [facility] Penetration/Aspiration Scale Score: Penetration/Aspiration Scale Score 1.  Material does not enter airway: Puree; Solid 2.  Material enters airway, remains  ABOVE vocal cords then ejected out: Mildly thick liquids (Level 2, nectar thick); Thin liquids (Level 0); Pill Compensatory Strategies: Compensatory Strategies Compensatory strategies: Yes Straw: Effective Effective Straw: Thin liquid (Level 0); Mildly thick liquid (Level 2, nectar thick); Pill   General Information: Caregiver present: No  Diet Prior to this Study: NPO; Cortrak/Small bore NG tube   Temperature : Normal   Respiratory Status: WFL   Supplemental O2: Nasal cannula (2L)   History of Recent Intubation: No  Behavior/Cognition: Lethargic/Drowsy; Alert; Cooperative Self-Feeding Abilities: Able to self-feed Baseline vocal quality/speech: Normal No data recorded Volitional Swallow: Able to elicit No data recorded Goal Planning: Prognosis for improved oropharyngeal function: Good Barriers to Reach Goals: Cognitive deficits No data recorded Patient/Family Stated Goal: eat again Consulted and agree with results and recommendations: Patient Pain: Pain Assessment Pain Assessment: No/denies pain Pain Score: 0 End of Session: Start Time:No data recorded Stop Time: No data recorded Time Calculation:No data recorded Charges: No data recorded SLP visit diagnosis: No data recorded Past Medical History: Past Medical History: Diagnosis Date  Acute blood loss anemia   Anxiety   AV block, Mobitz 1   Cataract   Chronic kidney disease   d/t DM  CKD (chronic kidney disease), stage III (HCC)   COVID-19 virus infection 07/18/2020  Last Assessment & Plan:   Formatting of this note might be different from the original.  Immunocompromise high  risk patient  -ID consulted for further therapies and will administer regeneron as OP tomorrow   -Decadron  discontinued as patient is off O2    Depression   Diabetes mellitus   Vgo disposal insulin  bolus  simular to insulin  pump  Dyspnea   GERD (gastroesophageal reflux disease)   History of kidney stones   passed  Hyperlipidemia   Hypertension   Idiopathic pulmonary fibrosis (HCC) 11/2016  ILD (interstitial lung disease) (HCC)   Moderate aortic stenosis   a. 10/2019 Echo: EF 55-60%, Gr2 DD. Nl RV.   Neuromuscular disorder (HCC)   Neuropathy associated with endocrine disorder   Nonobstructive CAD (coronary artery disease)   a. 2012 Cath: mod, nonobs dzs; b. 10/2016 MV: EF 60%, no ischemia.  OSA on CPAP 05/05/2017  Unattended Home Sleep Test 7/2/813-AHI 38.6/hour, desaturation to 64%, body weight 261 pounds  PONV (postoperative nausea and vomiting)   Postoperative anemia due to acute blood loss 11/07/2020  Postoperative hemorrhagic shock 03/20/2021  Sleep apnea    uses cpap asked to bring mask and tubing Past Surgical History: Past Surgical History: Procedure Laterality Date  ABDOMINAL AORTOGRAM W/LOWER EXTREMITY N/A 12/10/2020  Procedure: ABDOMINAL AORTOGRAM W/LOWER EXTREMITY;  Surgeon: Magda Debby SAILOR, MD;  Location: [facility] INVASIVE CV LAB;  Service: Cardiovascular;  Laterality: N/A;  AMPUTATION Right 01/22/2021  Procedure: 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;  Surgeon: Harden Jerona GAILS, MD;  Location: [facility] OR;  Service: Orthopedics;  Laterality: Right;  AMPUTATION Right 03/17/2021  Procedure: RIGHT BELOW KNEE AMPUTATION;  Surgeon: Harden Jerona GAILS, MD;  Location: [facility] OR;  Service: Orthopedics;  Laterality: Right;  AMPUTATION Left 12/11/2024  Procedure: AMPUTATION BELOW KNEE;  Surgeon: Harden Jerona GAILS, MD;  Location: [facility] OR;  Service: Orthopedics;  Laterality: Left;  ANKLE FUSION Right 01/22/2021  Procedure: RIGHT FOOT TIBIOCALCANEAL FUSION;  Surgeon: Harden Jerona GAILS, MD;  Location: [facility] OR;  Service: Orthopedics;  Laterality: Right;  ANTERIOR  FUSION CERVICAL SPINE  2012  APPLICATION OF WOUND VAC Left 12/11/2024  Procedure: APPLICATION, WOUND VAC;  Surgeon: Harden Jerona GAILS, MD;  Location: [facility] OR;  Service: Orthopedics;  Laterality: Left;  CARDIAC CATHETERIZATION  2011  CARDIAC CATHETERIZATION N/A 11/09/2016  Procedure: Right Heart Cath;  Surgeon: Victory LELON Sharps, MD;  Location: [facility] INVASIVE CV LAB;  Service: Cardiovascular;  Laterality: N/A;  carpel tunnel    left wrist  CATARACT EXTRACTION    CATARACT EXTRACTION W/ INTRAOCULAR LENS  IMPLANT, BILATERAL  2013  CERVICAL LAMINECTOMY  2012  COLONOSCOPY N/A 01/14/2013  Procedure: COLONOSCOPY;  Surgeon: Norleen LOISE Kiang, MD;  Location: [facility] ENDOSCOPY;  Service: Endoscopy;  Laterality: N/A;  CORONARY ARTERY BYPASS GRAFT  11/04/2020  LIMA-LAD, SVG-OM1, SVG-PDA (Dr Norleen Exon [facility]) dc 11/18/2020  EYE SURGERY    I & D EXTREMITY Right 02/19/2021  Procedure: RIGHT ANKLE DEBRIDEMENT AND PLACEMENT ANTIBIOTIC BEADS;  Surgeon: Harden Jerona GAILS, MD;  Location: [facility] OR;  Service: Orthopedics;  Laterality: Right;  KNEE SURGERY  1998  left  LEFT HEART CATH AND CORONARY ANGIOGRAPHY N/A 07/10/2020  Procedure: LEFT HEART CATH AND CORONARY ANGIOGRAPHY;  Surgeon: Wonda Sharper, MD;  Location: [facility] INVASIVE CV LAB;  Service: Cardiovascular;  Laterality: N/A;  LUMBAR LAMINECTOMY  2003  LUNG BIOPSY Left 12/26/2016  Procedure: LUNG BIOPSY;  Surgeon: Elspeth JAYSON Millers, MD;  Location: [facility] OR;  Service: Thoracic;  Laterality: Left;  PACEMAKER IMPLANT N/A 03/30/2020  Procedure: PACEMAKER IMPLANT;  Surgeon: Waddell Danelle LELON, MD;  Location: [facility] INVASIVE CV LAB;  Service: Cardiovascular;  Laterality: N/A;  PERIPHERAL VASCULAR INTERVENTION Right 12/10/2020  Procedure: PERIPHERAL VASCULAR INTERVENTION;  Surgeon: Magda Debby LOISE, MD;  Location: [facility] INVASIVE CV LAB;  Service: Cardiovascular;  Laterality: Right;  SFA  POSTERIOR FUSION CERVICAL SPINE  2012  PPM GENERATOR REMOVAL N/A 12/14/2020  Procedure: PPM GENERATOR REMOVAL;  Surgeon: Waddell Danelle LELON, MD;  Location: [facility]  INVASIVE CV LAB;  Service: Cardiovascular;  Laterality: N/A;  TEE WITHOUT CARDIOVERSION N/A 12/11/2020  Procedure: TRANSESOPHAGEAL ECHOCARDIOGRAM (TEE);  Surgeon: Barbaraann Darryle Debby, MD;  Location: [facility] ENDOSCOPY;  Service: Cardiovascular;  Laterality: N/A;  TRIGGER FINGER RELEASE  2011  4th finger left hand  VIDEO ASSISTED THORACOSCOPY Left 12/26/2016  Procedure: VIDEO ASSISTED THORACOSCOPY;  Surgeon: Elspeth JAYSON Millers, MD;  Location: [facility] OR;  Service: Thoracic;  Laterality: Left;  VIDEO BRONCHOSCOPY N/A 12/26/2016  Procedure: VIDEO BRONCHOSCOPY;  Surgeon: Elspeth JAYSON Millers, MD;  Location: [facility] OR;  Service: Thoracic;  Laterality: N/AMERL Recardo DELENA Berna 12/27/2024, 9:34 PM    Labs: BMET Recent Labs  Lab 12/23/24 0420 12/24/24 0448 12/25/24 0552 12/27/24 0302 12/28/24 0257 12/29/24 0512  NA 136 136 133* 131* 131* 132*  K 4.9 4.7 5.1 5.6* 5.6* 4.9  CL 99 95* 94* 93* 93* 94*  CO2 28 30 29 29 29 31   GLUCOSE 109* 123* 128* 161* 119* 133*  BUN 110* 77* 99* 83* 93* 45*  CREATININE 2.72* 2.31* 2.73* 2.70* 2.84* 1.93*  CALCIUM  9.0 9.0 8.9 9.1 8.6* 8.4*  PHOS 3.7 4.3 4.7* 4.6 4.1 3.6   CBC Recent Labs  Lab 12/23/24 0420 12/24/24 1157 12/25/24 0552 12/29/24 0512  WBC 11.6* 14.3* 12.5* 9.8  NEUTROABS  --  10.8*  --   --   HGB 8.5* 9.2* 8.9* 7.8*  HCT 26.0* 28.3* 27.1* 23.4*  MCV 91.2 90.4 91.9 90.7  PLT 327 359 331 266    Medications:     (feeding supplement) PROSource Plus  60 mL Oral BID BM   acidophilus  1 capsule Oral Daily   apixaban   2.5 mg Oral BID   vitamin C   1,000 mg Oral Daily  atorvastatin   80 mg Oral Daily   Chlorhexidine  Gluconate Cloth  6 each Topical BID   ferrous sulfate   325 mg Oral Q breakfast   gabapentin   100 mg Oral QHS   Gerhardt's butt cream  1 Application Topical TID   insulin  aspart  0-15 Units Subcutaneous Q4H   insulin  aspart  2 Units Subcutaneous Q4H   insulin  glargine  18 Units Subcutaneous BID   midodrine   5 mg Oral TID WC   multivitamin with  minerals  1 tablet Oral q AM   nutrition supplement (JUVEN)  1 packet Oral BID BM   mouth rinse  15 mL Mouth Rinse 4 times per day   pantoprazole   40 mg Oral BID   ramelteon   8 mg Oral QHS   sodium chloride  flush  10-40 mL Intracatheter Q12H   tamsulosin   0.4 mg Oral QPC supper    Gordy Blanch, MD 12/29/2024, 7:40 AM

## 2024-12-29 NOTE — Progress Notes (Signed)
 Notes stating to continue holding feeds until after abd xray results came back. Xray normal, but notes/orders unclear of starting feeds back. Oncall Mercedes called and informed about the concerns. Advised to continue to admin Zofran , and ask pt if he feels like he will be able to tolerate the feeds tonight and if not it is okay to continue to hold for tonight and reevaluation can take place in the am. Spoke with patient and his wife, pt does not want to continue feeds for tonight. Pt cbg was 69. Gave apple juice and will recheck in 15+ mins. Pt was very upset about the air mattress and expressing his concerns about the functioning of the bed. Pt wife bought in chicken salad and mac and cheese for the pt to eat for dinner. Pt stated I will not eat anything until this bed is changed out, It seems like nobody wants to listen to me until I put my foot down. Jesus been asking people everyday for the past few days to get me a new bed and nobody has done anything about it, I will not eat anything until I get a new bed Made charge nurse Rosina aware and a regular bed was ordered and pt was changed from a air mattress bed to a regular bed. Pt cbg came up to 96. Pt was prepared for bed, ate dinner and no further concerns at this time. Wife at bedside, call bell in reach, bed in lowest position, bed alarm on.

## 2024-12-30 DIAGNOSIS — I5022 Chronic systolic (congestive) heart failure: Secondary | ICD-10-CM

## 2024-12-30 DIAGNOSIS — I739 Peripheral vascular disease, unspecified: Secondary | ICD-10-CM

## 2024-12-30 DIAGNOSIS — D62 Acute posthemorrhagic anemia: Secondary | ICD-10-CM

## 2024-12-30 LAB — BASIC METABOLIC PANEL WITH GFR
Anion gap: 7 (ref 5–15)
BUN: 53 mg/dL — ABNORMAL HIGH (ref 8–23)
CO2: 31 mmol/L (ref 22–32)
Calcium: 8.5 mg/dL — ABNORMAL LOW (ref 8.9–10.3)
Chloride: 95 mmol/L — ABNORMAL LOW (ref 98–111)
Creatinine, Ser: 2.4 mg/dL — ABNORMAL HIGH (ref 0.61–1.24)
GFR, Estimated: 26 mL/min — ABNORMAL LOW
Glucose, Bld: 77 mg/dL (ref 70–99)
Potassium: 5.1 mmol/L (ref 3.5–5.1)
Sodium: 133 mmol/L — ABNORMAL LOW (ref 135–145)

## 2024-12-30 LAB — CBC
HCT: 22.5 % — ABNORMAL LOW (ref 39.0–52.0)
Hemoglobin: 7.5 g/dL — ABNORMAL LOW (ref 13.0–17.0)
MCH: 30.4 pg (ref 26.0–34.0)
MCHC: 33.3 g/dL (ref 30.0–36.0)
MCV: 91.1 fL (ref 80.0–100.0)
Platelets: 241 10*3/uL (ref 150–400)
RBC: 2.47 MIL/uL — ABNORMAL LOW (ref 4.22–5.81)
RDW: 14.6 % (ref 11.5–15.5)
WBC: 8.5 10*3/uL (ref 4.0–10.5)
nRBC: 0 % (ref 0.0–0.2)

## 2024-12-30 LAB — RENAL FUNCTION PANEL
Albumin: 3.1 g/dL — ABNORMAL LOW (ref 3.5–5.0)
Anion gap: 7 (ref 5–15)
BUN: 53 mg/dL — ABNORMAL HIGH (ref 8–23)
CO2: 32 mmol/L (ref 22–32)
Calcium: 8.6 mg/dL — ABNORMAL LOW (ref 8.9–10.3)
Chloride: 95 mmol/L — ABNORMAL LOW (ref 98–111)
Creatinine, Ser: 2.38 mg/dL — ABNORMAL HIGH (ref 0.61–1.24)
GFR, Estimated: 26 mL/min — ABNORMAL LOW
Glucose, Bld: 78 mg/dL (ref 70–99)
Phosphorus: 4.2 mg/dL (ref 2.5–4.6)
Potassium: 5 mmol/L (ref 3.5–5.1)
Sodium: 133 mmol/L — ABNORMAL LOW (ref 135–145)

## 2024-12-30 LAB — GLUCOSE, CAPILLARY
Glucose-Capillary: 101 mg/dL — ABNORMAL HIGH (ref 70–99)
Glucose-Capillary: 145 mg/dL — ABNORMAL HIGH (ref 70–99)
Glucose-Capillary: 178 mg/dL — ABNORMAL HIGH (ref 70–99)
Glucose-Capillary: 192 mg/dL — ABNORMAL HIGH (ref 70–99)
Glucose-Capillary: 211 mg/dL — ABNORMAL HIGH (ref 70–99)
Glucose-Capillary: 51 mg/dL — ABNORMAL LOW (ref 70–99)
Glucose-Capillary: 59 mg/dL — ABNORMAL LOW (ref 70–99)
Glucose-Capillary: 84 mg/dL (ref 70–99)

## 2024-12-30 MED ORDER — DARBEPOETIN ALFA 100 MCG/0.5ML IJ SOSY
100.0000 ug | PREFILLED_SYRINGE | Freq: Once | INTRAMUSCULAR | Status: AC
  Administered 2024-12-30: 100 ug via SUBCUTANEOUS
  Filled 2024-12-30: qty 0.5

## 2024-12-30 MED ORDER — INSULIN ASPART 100 UNIT/ML IJ SOLN
0.0000 [IU] | Freq: Three times a day (TID) | INTRAMUSCULAR | Status: DC
  Administered 2024-12-30: 2 [IU] via SUBCUTANEOUS
  Administered 2024-12-30: 3 [IU] via SUBCUTANEOUS
  Administered 2024-12-31: 2 [IU] via SUBCUTANEOUS
  Administered 2024-12-31: 3 [IU] via SUBCUTANEOUS
  Administered 2024-12-31 – 2025-01-01 (×2): 2 [IU] via SUBCUTANEOUS
  Administered 2025-01-01: 3 [IU] via SUBCUTANEOUS
  Administered 2025-01-02: 2 [IU] via SUBCUTANEOUS
  Filled 2024-12-30 (×4): qty 2
  Filled 2024-12-30 (×3): qty 3

## 2024-12-30 NOTE — Progress Notes (Signed)
 Patient Shane [name] - 51, given orange juice and was rechecked, Shane [name]- 101.Patient is alert and oriented, no complaints of hypoglycemia symptoms. Will continue blood sugar check every 4 hrs.

## 2024-12-30 NOTE — Progress Notes (Signed)
 Patient ID: Shane [name], male   DOB: 08/26/1941, 84 y.o.   MRN: [id number] Met with the patient and wife to review current medical situation, rehab process, team conference and plan of care. Discussed medications for A-Fib, DM ( A1C 7.6) on Lantus , Hypotension on midodrine , HLD (LDL 26/Trig 126) on Lipitor  . Able to use CPAP now that cortrak is out. Reviewed nutritional supplement with D3 diet. Patient with poor memory; noted no concerns at present. Hopeful to be finished with HD; catheter in right internal jugular. Small area of dehiscence noted on residual limb post vac removal; tolerating shrinker placement , limb guard in room.  Continue to follow along to address educational needs to facilitate preparation for discharge. Fredericka Barnie NOVAK

## 2024-12-30 NOTE — Progress Notes (Signed)
 [facility] NEPHROLOGY PROGRESS NOTE  Assessment/ Plan: Pt is a 84 y.o. yo male   # Acute kidney Injury on chronic kidney disease stage III: Secondary to ATN with dependency on CRRT between 1/10 - 1/12 and then transitioned onto intermittent hemodialysis on MWF schedule.  The last dialysis was on 1/24. - Urine output is increasing with no signs or symptoms of uremia.  Potassium level acceptable.  Daily assessment for dialysis need, no plan for HD today.  Hoping continue renal recovery.  Continue strict ins and outs and daily lab.  # Septic shock: Secondary to gangrene of left foot status post left BKA on 12/11/2024.  Transitioned to midodrine  after previously requiring pressors.  Completed Zyvox  and Zosyn .  # Paroxysmal atrial fibrillation: Rate controlled with ongoing anticoagulation with Eliquis .  # Anemia: Secondary to chronic illness and recent exacerbation by postop losses. A dose of Aranesp  on 1/26, monitor hb.   #  Status post left below-knee amputation: With evidence of peripheral vascular disease and gangrene of left foot.  # Hyponatremia: Continue fluid restriction and monitor lab.   Subjective: Seen and examined at bedside.  Patient had urine output of 700 cc.  He denies nausea, vomiting, chest pain or shortness of breath.  Feels much better.  His wife at the bedside. Objective Vital signs in last 24 hours: Vitals:   12/29/24 1432 12/29/24 2002 12/30/24 0409 12/30/24 1300  BP:  (!) 118/53 (!) 113/51 (!) 105/53  Pulse:  (!) 59 63 60  Resp:  18 18 17   Temp:  97.9 F (36.6 C) 97.8 F (36.6 C) 97.6 F (36.4 C)  TempSrc:  Oral Oral Oral  SpO2:  100% 100% 100%  Weight: 96.7 kg  97.5 kg   Height:       Weight change: -1.3 kg  Intake/Output Summary (Last 24 hours) at 12/30/2024 1353 Last data filed at 12/30/2024 1300 Gross per 24 hour  Intake 1200 ml  Output 875 ml  Net 325 ml       Labs: RENAL PANEL Recent Labs  Lab 12/24/24 0448 12/24/24 1157  12/25/24 0552 12/27/24 0302 12/28/24 0257 12/29/24 0512 12/30/24 0429  NA 136  --  133* 131* 131* 132* 133*  133*  K 4.7  --  5.1 5.6* 5.6* 4.9 5.0  5.1  CL 95*  --  94* 93* 93* 94* 95*  95*  CO2 30  --  29 29 29 31  32  31  GLUCOSE 123*  --  128* 161* 119* 133* 78  77  BUN 77*  --  99* 83* 93* 45* 53*  53*  CREATININE 2.31*  --  2.73* 2.70* 2.84* 1.93* 2.38*  2.40*  CALCIUM  9.0  --  8.9 9.1 8.6* 8.4* 8.6*  8.5*  MG 2.2  --  2.4  --   --   --   --   PHOS 4.3  --  4.7* 4.6 4.1 3.6 4.2  ALBUMIN  3.3*   < > 3.3* 3.2* 3.2* 3.1* 3.1*   < > = values in this interval not displayed.    Liver Function Tests: Recent Labs  Lab 12/24/24 1157 12/25/24 0552 12/28/24 0257 12/29/24 0512 12/30/24 0429  AST 48*  --   --   --   --   ALT 34  --   --   --   --   ALKPHOS 136*  --   --   --   --   BILITOT 0.3  --   --   --   --  PROT 7.1  --   --   --   --   ALBUMIN  3.2*   < > 3.2* 3.1* 3.1*   < > = values in this interval not displayed.   No results for input(s): LIPASE, AMYLASE in the last 168 hours. No results for input(s): AMMONIA in the last 168 hours. CBC: Recent Labs    12/23/24 0420 12/24/24 1157 12/25/24 0552 12/29/24 0512 12/30/24 0429  HGB 8.5* 9.2* 8.9* 7.8* 7.5*  MCV 91.2 90.4 91.9 90.7 91.1    Cardiac Enzymes: No results for input(s): CKTOTAL, CKMB, CKMBINDEX, TROPONINI in the last 168 hours. CBG: Recent Labs  Lab 12/30/24 0051 12/30/24 0201 12/30/24 0407 12/30/24 0829 12/30/24 1138  GLUCAP 51* 101* 84 211* 178*    Iron  Studies: No results for input(s): IRON , TIBC, TRANSFERRIN, FERRITIN in the last 72 hours. Studies/Results: No results found.  Medications: Infusions:   Scheduled Medications:  (feeding supplement) PROSource Plus  60 mL Oral BID BM   acidophilus  1 capsule Oral Daily   apixaban   2.5 mg Oral BID   vitamin C   1,000 mg Oral Daily   atorvastatin   80 mg Oral Daily   Chlorhexidine  Gluconate Cloth  6 each Topical  BID   ferrous sulfate   325 mg Oral Q breakfast   gabapentin   100 mg Oral QHS   Gerhardt's butt cream  1 Application Topical TID   insulin  aspart  0-15 Units Subcutaneous Q4H   insulin  glargine  18 Units Subcutaneous BID   midodrine   5 mg Oral TID WC   multivitamin with minerals  1 tablet Oral q AM   nutrition supplement (JUVEN)  1 packet Oral BID BM   mouth rinse  15 mL Mouth Rinse 4 times per day   pantoprazole   40 mg Oral BID   ramelteon   8 mg Oral QHS   sodium chloride  flush  10-40 mL Intracatheter Q12H   tamsulosin   0.4 mg Oral QPC supper    have reviewed scheduled and prn medications.  Physical Exam: General:NAD, comfortable Heart:RRR, s1s2 nl Lungs:clear b/l, no crackle Abdomen:soft, Non-tender, non-distended Extremities:No edema, right BKA, left BKA with wound VAC Dialysis Access: Left IJ temporary HD catheter.  Rishita Petron Prasad Tadarrius Burch 12/30/2024,1:53 PM  LOS: 5 days

## 2024-12-30 NOTE — Progress Notes (Signed)
 "                                                        PROGRESS NOTE   Subjective/Complaints: No nausea or vomiting. Wants Cortrak out. Wakes up short of breath/gagging sometimes, feels that NGT contributes. Ate better yesterday and 100% of breakfast. Left stump is tender.   ROS: Patient denies fever, rash, sore throat, blurred vision, dizziness, nausea, vomiting, diarrhea, cough, shortness of breath or chest pain, joint or back/neck pain, headache, or mood change.   Objective:   DG Abd 2 Views Result Date: 12/28/2024 EXAM: 2 VIEW XRAY OF THE ABDOMEN 12/28/2024 10:17:00 AM COMPARISON: 12/24/2024 CLINICAL HISTORY: Nausea. FINDINGS: LINES, TUBES AND DEVICES: The enteric tube is present with the tip projecting over the right upper quadrant, consistent with a location in the second portion of the duodenum. Vascular graft in the right femoral region. BOWEL: Nonobstructive bowel gas pattern. There is residual contrast material throughout the decompressed colon. No small or large bowel distention. SOFT TISSUES: No abnormal calcifications. No radiopaque stones. BONES: Degenerative changes in the spine and hips. No acute fracture. VISUALIZED CHEST: Postoperative changes in the mediastinum. Probable bilateral pleural effusions with basilar atelectasis infiltrates. IMPRESSION: 1. No small or large bowel distention to suggest bowel obstruction or ileus. 2. Enteric tube tip projects over the right upper quadrant, consistent with location in the second portion of the duodenum. Electronically signed by: Elsie Gravely MD 12/28/2024 04:11 PM EST RP Workstation: [id number]   Recent Labs    12/29/24 0512 12/30/24 0429  WBC 9.8 8.5  HGB 7.8* 7.5*  HCT 23.4* 22.5*  PLT 266 241    Recent Labs    12/29/24 0512 12/30/24 0429  NA 132* 133*  133*  K 4.9 5.0  5.1  CL 94* 95*  95*  CO2 31 32  31  GLUCOSE 133* 78  77  BUN 45* 53*  53*  CREATININE 1.93* 2.38*  2.40*  CALCIUM  8.4* 8.6*  8.5*     Intake/Output Summary (Last 24 hours) at 12/30/2024 0957 Last data filed at 12/30/2024 0745 Gross per 24 hour  Intake 960 ml  Output 1050 ml  Net -90 ml        Physical Exam: Vital Signs Blood pressure (!) 113/51, pulse 63, temperature 97.8 F (36.6 C), temperature source Oral, resp. rate 18, height 5' (1.524 m), weight 97.5 kg, SpO2 100%.  Constitutional: No distress . Vital signs reviewed. HEENT: NCAT, EOMI, oral membranes moist, Cortrak Neck: supple Cardiovascular: RRR without murmur. No JVD    Respiratory/Chest: CTA Bilaterally without wheezes or rales. Normal effort. O2 4L,   GI/Abdomen: BS +, non-tender, non-distended Ext: no clubbing, cyanosis, or edema Psych: pleasant and cooperative  Right BKA well-healed. Left BKA in shrinker with fresh dressing. Leg is well formed with residual edema. Sl tender to palpation Skin: bruising noted on Ues, warm and dry Neuro:   Alert and awake, follows simple commands, CN 2-12 grossly intact, moving all 4 extremities to gravity.     Assessment/Plan: 1. Functional deficits which require 3+ hours per day of interdisciplinary therapy in a comprehensive inpatient rehab setting. Physiatrist is providing close team supervision and 24 hour management of active medical problems listed below. Physiatrist and rehab team continue to assess barriers to discharge/monitor patient progress toward functional and medical  goals  Care Tool:  Bathing    Body parts bathed by patient: Right arm, Left arm, Chest, Abdomen, Face, Front perineal area   Body parts bathed by helper: Buttocks, Right upper leg, Left upper leg, Right lower leg Body parts n/a: Left lower leg   Bathing assist Assist Level: Maximal Assistance - Patient 24 - 49%     Upper Body Dressing/Undressing Upper body dressing   What is the patient wearing?: Hospital gown only    Upper body assist Assist Level: Minimal Assistance - Patient > 75%    Lower Body  Dressing/Undressing Lower body dressing      What is the patient wearing?: Hospital gown only     Lower body assist Assist for lower body dressing: Dependent - Patient 0%     Toileting Toileting    Toileting assist Assist for toileting: Dependent - Patient 0%     Transfers Chair/bed transfer  Transfers assist  Chair/bed transfer activity did not occur: Safety/medical concerns (weakness/fatigue/pain/nausea)  Chair/bed transfer assist level: 2 Helpers (slideboard)     Locomotion Ambulation   Ambulation assist   Ambulation activity did not occur: Safety/medical concerns (B BKA)          Walk 10 feet activity   Assist  Walk 10 feet activity did not occur: Safety/medical concerns (B BKA)        Walk 50 feet activity   Assist Walk 50 feet with 2 turns activity did not occur: Safety/medical concerns (B BKA)         Walk 150 feet activity   Assist Walk 150 feet activity did not occur: Safety/medical concerns (B BKA)         Walk 10 feet on uneven surface  activity   Assist Walk 10 feet on uneven surfaces activity did not occur: Safety/medical concerns (B BKA)         Wheelchair     Assist Is the patient using a wheelchair?: Yes Type of Wheelchair: Manual Wheelchair activity did not occur: Safety/medical concerns (weakness/fatigue/pain/nausea)         Wheelchair 50 feet with 2 turns activity    Assist    Wheelchair 50 feet with 2 turns activity did not occur: Safety/medical concerns (weakness/fatigue/pain/nausea)       Wheelchair 150 feet activity     Assist  Wheelchair 150 feet activity did not occur: Safety/medical concerns (weakness/fatigue/pain/nausea)       Blood pressure (!) 113/51, pulse 63, temperature 97.8 F (36.6 C), temperature source Oral, resp. rate 18, height 5' (1.524 m), weight 97.5 kg, SpO2 100%.  Medical Problem List and Plan: 1. Functional deficits secondary to left BKA due to gangrenous  changes 12/11/2024 with wound VAC complicated by septic shock/acute metabolic encephalopathy/hospital delirium             -patient may not shower while wound vac is in place             -ELOS/Goals: 10-14 days S             -Continue CIR therapies including PT, OT  2.  Antithrombotics: -DVT/anticoagulation:  Pharmaceutical: Eliquis              -antiplatelet therapy: N/A 3. Pain Management: Neurontin  100 mg nightly, oxycodone  as needed, Robaxin  as needed  - 1/24 continue as needed oxycodone  for pain control 4. Mood/Behavior/Sleep: Rozerem  8 mg nightly, trazodone  75 mg nightly as needed             -antipsychotic agents: N/A 5.  Neuropsych/cognition: This patient is not capable of making decisions on his own behalf. 6. Skin/Wound Care: continue dry dressing and shrinker to left BKA 7. Fluids/Electrolytes/Nutrition: Routine and analysis with follow-up chemistries 8.  History of right BKA 2022.  Patient received CIR.  Intermittent use of prosthesis 9.  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  CRRT 1/10 - 1/12 transition to intermittent hemodialysis.  Follow-up renal services  -1/22-23  reviewed nephrology note, monitoring for need for dialysis  -1/26 labs appear relatively stable 10.  Paroxysmal atrial fibrillation.  Continue Eliquis .    - 1/24 patient did have vomiting after Eliquis , discussed with pharmacy we will hold off on redosing  -1/25-26 nausea improved, got his Eliquis  today    12/30/2024    4:09 AM 12/29/2024    8:02 PM 12/29/2024    2:32 PM  Vitals with BMI  Weight 214 lbs 15 oz  213 lbs 3 oz  BMI 41.98  41.63  Systolic 113 118   Diastolic 51 53   Pulse 63 59     11.  Acute on chronic anemia.  Follow-up CBC.  Continue iron  supplement  - 1/25 Hemoglobin lower at 7.8, recheck tomorrow   -1/26- hgb with further dip to 7.5, likely multifactorial, normal MCV   -will check one stool for OB   -follow up Wednesday 12.  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   NovoLog  4 units every 4 hours, Lantus  insulin  18 units twice daily. -1/22 had mild hypoglycemia yesterday, decrease novolog  to 2 units Q4h -1/23-26 stable continue current regimen monitor CBG (last 3)  Recent Labs    12/30/24 0201 12/30/24 0407 12/30/24 0829  GLUCAP 101* 84 211*    13.  Hypotension.  ProAmatine  5 mg every 8 hours.  Monitor with increased mobility  - 1/23-26 BP soft but overall stable continue to monitor    12/30/2024    4:09 AM 12/29/2024    8:02 PM 12/29/2024    2:32 PM  Vitals with BMI  Weight 214 lbs 15 oz  213 lbs 3 oz  BMI 41.98  41.63  Systolic 113 118   Diastolic 51 53   Pulse 63 59     14.  Chronic systolic congestive heart failure.  Echocardiogram with ejection fraction of 30 to 35%.  Monitor for any signs of fluid overload. Daily weights -1/23 no signs of overload noted, difficult to assess weight trend suspect inaccurate readings -1/24 does not appear to have signs of fluid overload, dialysis today per nephrology   [facility] Weights   12/28/24 1900 12/29/24 1432 12/30/24 0409  Weight: 97.7 kg 96.7 kg 97.5 kg     15.  CAD/AV block/[place]ob[place] 1/aortic stenosis/CABG 2021.  Follow-up cardiology services. 16.  Idiopathic pulmonary fibrosis/OSA.  Followed by Dr. Geronimo.  Patient with 2 L oxygen intermittently at home.  Currently not on CPAP until tube feeds discontinued  - 1/25 consider restarting CPAP if NG tube removed, breathing treatments helping   1/26 resume CPAP today 17.  Oropharyngeal dysphagia.  Follow-up speech therapy.  Currently with Cortrak feeding tube for nutritional support.  Advance diet as tolerated. Currently NPO with ice chips allowed  - 1/23 diet was upgraded today to D3 thin by SLP  -1/25 will hold tube feedings(pt was declining also), patient would like to try eating orally especially now that nausea is improved.  Monitor intake could consider removing tube tomorrow if doing well.   1/26 eating well enough. Dc TF. Pt aware that he  needs to push po intake.  18.  Diarrhea.  Related to tube feeds.  Flexi-Seal in place: continue Continue Imodium .  -1/23 LBM 1/21, improved continue to follow-up  -1/25 LBM  --- improved overall   19.  GERD: continue Protonix    20.  Hyperlipidemia: continue Lipitor   21. Itching.  -Continue atarax  PRN  -sarna PRN  22.  Hyperkalemia, hyponatremia -1/23 nephrology following has ordered Lokelma , appreciate assistance improved  23. Nausea  -hold tube feeds for now, nephrology planning dialysis today as this may be potential cause.  Abdominal x-ray ordered-no obstruction/ileus    -1/25-26 nausea has improved, continue current regimen  LOS: 5 days A FACE TO FACE EVALUATION WAS PERFORMED  Arthea ONEIDA Gunther 12/30/2024, 9:57 AM     "

## 2024-12-30 NOTE — Progress Notes (Signed)
 Occupational Therapy Session Note  Patient Details  Name: Shane [name] MRN: [id number] Date of Birth: 05-28-1941  Today's Date: 12/30/2024 OT Individual Time: 1447-1530 OT Individual Time Calculation (min): 43 min    Short Term Goals: Week 1:  OT Short Term Goal 1 (Week 1): Patient to perform bed level LE dressing with Mod assist OT Short Term Goal 2 (Week 1): Patient to perform [facility] transfer with Max of one OT Short Term Goal 3 (Week 1): Patient to perform bathing at bed level with Min assist  Skilled Therapeutic Interventions/Progress Updates:   Patient received supine in bed with wife at bedside.  Patient pleasant and eager for therapy.  Patient with 02 via [place] and wife indicates he does not need this during the day.   Patient agreeable to sit at edge of bed. Worked on balance -static to dynamic sitting balance.  Worked on lateral weight shifts to pull shorts over hips in sitting edge of bed.  Patient became very dizzy and had difficulty getting air. Patient with H/O IPF.  Patient encouraged to lie down and recover.  BP and O2 checked and neither concerning.  (O2 100%, BP 125/69)   Obtained additional O2 tubing/ connector so patient had the option of leaving O2 on when up in chair.   Patient able to get back up to sitting - O2 remained in place, and symptoms did not reoccur.  Patient dressed while seated at edge of bed.  Returned to supine at end of session.  Bed alarm engaged, call bell in reach, and wife at bedside.    Therapy Documentation Precautions:  Precautions Precautions: Fall Recall of Precautions/Restrictions: Intact Precaution/Restrictions Comments: watch BP wound VAC, LLE splint Restrictions Weight Bearing Restrictions Per Provider Order: Yes LLE Weight Bearing Per Provider Order: Non weight bearing  Pain: Denies pain    Therapy/Group: Individual Therapy  Alyss Granato M 12/30/2024, 3:49 PM

## 2024-12-30 NOTE — Progress Notes (Signed)
 Speech Language Pathology Daily Session Note  Patient Details  Name: Shane [name] MRN: [id number] Date of Birth: 06/29/1941  Today's Date: 12/30/2024 SLP Individual Time: 1400-1445 SLP Individual Time Calculation (min): 45 min  Short Term Goals: Week 1: SLP Short Term Goal 1 (Week 1): Pt will participate in MBSS to identify least restrictive diet SLP Short Term Goal 2 (Week 1): Pt will solve mildly complex problems w/ supervisionA SLP Short Term Goal 3 (Week 1): Pt will recall recent/relevant information w/ supervisionA  Skilled Therapeutic Interventions:   Pt greeted at bedside for tx targeting cognition and dysphagia. He was awake in bed w/ his significant other present at bedside. He was fatigued, though very agreeable to tx tasks. He recalled recent events, including ST tx session this weekend w/ minA. Given c/o esophageal symptoms this weekend, SLP provided continued education re MBSS results and esophageal dysphagia. The pt and his wife reviewed images from recent MBSS and SLP provided additional education re reflux precautions. MD/PA notified of continued esophageal symptoms despite current medication regimen. May benefit from GI consult. Throughout conversation, he required only supervisionA for complex problem solving re reflux precautions, especially positioning during PO intake. At the end of tx tasks, he was left in bed w/ the alarm set and call light within reach. Recommend cont ST per POC.   Pain  No pain reported - appeared comfortable   Therapy/Group: Individual Therapy  Recardo DELENA Mole 12/30/2024, 8:31 PM

## 2024-12-30 NOTE — Progress Notes (Signed)
 Made covering physician Dr. Babs of small area to incision line that dehisced.   Geni Armor, LPN

## 2024-12-30 NOTE — Plan of Care (Signed)
" °  Problem: Consults Goal: RH GENERAL PATIENT EDUCATION Description: See Patient Education module for education specifics. Outcome: Progressing   Problem: RH BOWEL ELIMINATION Goal: RH STG MANAGE BOWEL WITH ASSISTANCE Description: STG Manage Bowel with min Assistance. Outcome: Progressing   Problem: RH BLADDER ELIMINATION Goal: RH STG MANAGE BLADDER WITH ASSISTANCE Description: STG Manage Bladder With min  Assistance Outcome: Progressing   Problem: RH SKIN INTEGRITY Goal: RH STG SKIN FREE OF INFECTION/BREAKDOWN Description: Manage skin free of infection with min assistance Outcome: Progressing   Problem: RH SAFETY Goal: RH STG ADHERE TO SAFETY PRECAUTIONS W/ASSISTANCE/DEVICE Description: STG Adhere to Safety Precautions With min Assistance/Device. Outcome: Progressing   Problem: RH PAIN MANAGEMENT Goal: RH STG PAIN MANAGED AT OR BELOW PT'S PAIN GOAL Description: <4 w/ prns Outcome: Progressing   Problem: RH KNOWLEDGE DEFICIT GENERAL Goal: RH STG INCREASE KNOWLEDGE OF SELF CARE AFTER HOSPITALIZATION Description: Manage knowledge of self care after hospitalization with min assistance from spouse using educational materials provided  Outcome: Progressing   "

## 2024-12-30 NOTE — Progress Notes (Signed)
 Physical Therapy Note  Patient Details  Name: Shane [name] MRN: [id number] Date of Birth: 1941/05/21 Today's Date: 12/30/2024    Due to the [place]  state of emergency, patients may not receive 3.0 hours of therapy services.    Joelene Barriere 12/30/2024, 11:00 AM

## 2024-12-30 NOTE — Progress Notes (Signed)
 Pt reports itching around edges of HDC dressing. Noted pink coloration of skin at lower edge of dressing. States he has been scratching site; it is unknown if pink coloration occurred before or after scratching. Confirmed pt has no known allergies to tape or CHG. Discussed risk/benefits of a 2nd dressing change in the same day. Pt opted to hold on dressing change to see if itching calms first. Nurse aware; IV team available for further needs.

## 2024-12-31 DIAGNOSIS — Z89512 Acquired absence of left leg below knee: Secondary | ICD-10-CM | POA: Diagnosis not present

## 2024-12-31 DIAGNOSIS — G4733 Obstructive sleep apnea (adult) (pediatric): Secondary | ICD-10-CM | POA: Diagnosis not present

## 2024-12-31 DIAGNOSIS — R1312 Dysphagia, oropharyngeal phase: Secondary | ICD-10-CM | POA: Diagnosis not present

## 2024-12-31 DIAGNOSIS — I480 Paroxysmal atrial fibrillation: Secondary | ICD-10-CM | POA: Diagnosis not present

## 2024-12-31 DIAGNOSIS — E11649 Type 2 diabetes mellitus with hypoglycemia without coma: Secondary | ICD-10-CM | POA: Diagnosis not present

## 2024-12-31 DIAGNOSIS — N183 Chronic kidney disease, stage 3 unspecified: Secondary | ICD-10-CM | POA: Diagnosis not present

## 2024-12-31 DIAGNOSIS — Z794 Long term (current) use of insulin: Secondary | ICD-10-CM | POA: Diagnosis not present

## 2024-12-31 DIAGNOSIS — R197 Diarrhea, unspecified: Secondary | ICD-10-CM | POA: Diagnosis not present

## 2024-12-31 DIAGNOSIS — R112 Nausea with vomiting, unspecified: Secondary | ICD-10-CM | POA: Diagnosis not present

## 2024-12-31 DIAGNOSIS — N179 Acute kidney failure, unspecified: Secondary | ICD-10-CM | POA: Diagnosis not present

## 2024-12-31 LAB — RENAL FUNCTION PANEL
Albumin: 3.2 g/dL — ABNORMAL LOW (ref 3.5–5.0)
Anion gap: 6 (ref 5–15)
BUN: 58 mg/dL — ABNORMAL HIGH (ref 8–23)
CO2: 32 mmol/L (ref 22–32)
Calcium: 9 mg/dL (ref 8.9–10.3)
Chloride: 98 mmol/L (ref 98–111)
Creatinine, Ser: 2.35 mg/dL — ABNORMAL HIGH (ref 0.61–1.24)
GFR, Estimated: 27 mL/min — ABNORMAL LOW
Glucose, Bld: 98 mg/dL (ref 70–99)
Phosphorus: 3.9 mg/dL (ref 2.5–4.6)
Potassium: 5.5 mmol/L — ABNORMAL HIGH (ref 3.5–5.1)
Sodium: 136 mmol/L (ref 135–145)

## 2024-12-31 LAB — GLUCOSE, CAPILLARY
Glucose-Capillary: 106 mg/dL — ABNORMAL HIGH (ref 70–99)
Glucose-Capillary: 142 mg/dL — ABNORMAL HIGH (ref 70–99)
Glucose-Capillary: 150 mg/dL — ABNORMAL HIGH (ref 70–99)
Glucose-Capillary: 178 mg/dL — ABNORMAL HIGH (ref 70–99)

## 2024-12-31 LAB — CBC
HCT: 24.7 % — ABNORMAL LOW (ref 39.0–52.0)
Hemoglobin: 7.8 g/dL — ABNORMAL LOW (ref 13.0–17.0)
MCH: 29.7 pg (ref 26.0–34.0)
MCHC: 31.6 g/dL (ref 30.0–36.0)
MCV: 93.9 fL (ref 80.0–100.0)
Platelets: 262 10*3/uL (ref 150–400)
RBC: 2.63 MIL/uL — ABNORMAL LOW (ref 4.22–5.81)
RDW: 14.6 % (ref 11.5–15.5)
WBC: 8.1 10*3/uL (ref 4.0–10.5)
nRBC: 0 % (ref 0.0–0.2)

## 2024-12-31 MED ORDER — SODIUM ZIRCONIUM CYCLOSILICATE 10 G PO PACK
10.0000 g | PACK | Freq: Every day | ORAL | Status: DC
  Administered 2024-12-31 – 2025-01-10 (×11): 10 g via ORAL
  Filled 2024-12-31 (×11): qty 1

## 2024-12-31 MED ORDER — NEPRO/CARBSTEADY PO LIQD: 237.0000 mL | Freq: Two times a day (BID) | ORAL | Status: DC

## 2024-12-31 MED ORDER — HYDROXYZINE HCL 25 MG PO TABS
25.0000 mg | ORAL_TABLET | Freq: Three times a day (TID) | ORAL | Status: DC | PRN
  Administered 2024-12-31 – 2025-01-09 (×6): 25 mg via ORAL
  Filled 2024-12-31 (×6): qty 1

## 2024-12-31 MED ORDER — NEPRO/CARBSTEADY PO LIQD
237.0000 mL | Freq: Three times a day (TID) | ORAL | Status: DC
  Administered 2024-12-31 – 2025-01-09 (×21): 237 mL via ORAL

## 2024-12-31 NOTE — Progress Notes (Signed)
 Occupational Therapy Session Note  Patient Details  Name: Shane [name] MRN: [id number] Date of Birth: Feb 21, 1941  Today's Date: 12/31/2024 OT Individual Time: 9182-9068 OT Individual Time Calculation (min): 74 min    Short Term Goals: Week 1:  OT Short Term Goal 1 (Week 1): Patient to perform bed level LE dressing with Mod assist OT Short Term Goal 2 (Week 1): Patient to perform [facility] transfer with Max of one OT Short Term Goal 3 (Week 1): Patient to perform bathing at bed level with Min assist  Skilled Therapeutic Interventions/Progress Updates:  Patient agreeable to participate in OT session. Reports 0/10 pain level, premedicated.   Patient participated in skilled OT session focusing on dynamic balance, LE engagement, transfers. Patient received in bed ready for OT with nursing. Patient completed toileting max A with rolling CGA. Patient then completed LB dressing with min A utilizing reacher to don pants at bed level with HOB elevated with CGA for rolling. Patient then sat on EOB. Donned limb protector with mod A, prosthetic on opposite leg with min to mod A due to decreased core strength. Patient demonstrated static/ dynamic sitting balance, however decreased ability to lean due to general deconditioning. Patient transferred to chair via slide board max A. Patient then completed slide board transfer to mat with mod to max A. Patient required max to total A to slide on mat to increase LE usage with OT facilitation for RLE use. Patient then completed transfers back to bed with all needs in reach alarm on    Therapy Documentation Precautions:  Precautions Precautions: Fall Recall of Precautions/Restrictions: Intact Precaution/Restrictions Comments: watch BP wound VAC, LLE splint Restrictions Weight Bearing Restrictions Per Provider Order: Yes LLE Weight Bearing Per Provider Order: Non weight bearing  Therapy/Group: Individual Therapy  [name] Dadrian Ballantine 12/31/2024, 9:09 AM

## 2024-12-31 NOTE — Telephone Encounter (Signed)
 Called and spoke with pt's wife. They will send in a transmission after pt is discharged and back at home.

## 2024-12-31 NOTE — Progress Notes (Signed)
 [facility] NEPHROLOGY PROGRESS NOTE  Assessment/ Plan: Pt is a 84 y.o. yo male   # Acute kidney Injury on chronic kidney disease stage III: Secondary to ATN with dependency on CRRT between 1/10 - 1/12 and then transitioned onto intermittent hemodialysis on MWF schedule.  The last dialysis was on 1/24. -The urine output is increased and creatinine level is stable around 2.38.  No signs or symptoms of uremia.  Euvolemic on exam.  We will start Lokelma  daily for mild hyperkalemia.  No further need for dialysis.  We will discontinue temporary HD catheter.  Expect continued renal recovery.  Monitor lab. We will sign off, please call us  back with any question.  Reportedly patient will be in the rehab for the next few weeks.  If renal function is not completely recovered then he can be referred to [facility] as outpatient.  Discussed with the patient and his wife.    # Septic shock: Secondary to gangrene of left foot status post left BKA on 12/11/2024.  Transitioned to midodrine  after previously requiring pressors.  Completed Zyvox  and Zosyn .  # Paroxysmal atrial fibrillation: Rate controlled with ongoing anticoagulation with Eliquis .  # Anemia: Secondary to chronic illness and recent exacerbation by postop losses. A dose of Aranesp  on 1/26, monitor hb.   #  Status post left below-knee amputation: With evidence of peripheral vascular disease and gangrene of left foot.  # Hyponatremia: Improved.   # Hyperkalemia: Recommend low potassium diet.  Start Lokelma  daily.   Subjective: Seen and examined at bedside.  Urine output around 1.1 L.  He reports great.  Denies nausea, vomiting, chest pain, shortness of breath.  Reports good energy level.  Wife at bedside. Objective Vital signs in last 24 hours: Vitals:   12/30/24 1300 12/30/24 1957 12/31/24 0500 12/31/24 0541  BP: (!) 105/53 (!) 115/48  (!) 107/50  Pulse: 60 60  72  Resp: 17 16  18   Temp: 97.6 F (36.4 C)   97.8  F (36.6 C)  TempSrc: Oral   Oral  SpO2: 100% 100%  100%  Weight:   98 kg   Height:       Weight change: 1.3 kg  Intake/Output Summary (Last 24 hours) at 12/31/2024 1026 Last data filed at 12/31/2024 0900 Gross per 24 hour  Intake 720 ml  Output 1100 ml  Net -380 ml       Labs: RENAL PANEL Recent Labs  Lab 12/25/24 0552 12/27/24 0302 12/28/24 0257 12/29/24 0512 12/30/24 0429 12/31/24 0353  NA 133* 131* 131* 132* 133*  133* 136  K 5.1 5.6* 5.6* 4.9 5.0  5.1 5.5*  CL 94* 93* 93* 94* 95*  95* 98  CO2 29 29 29 31  32  31 32  GLUCOSE 128* 161* 119* 133* 78  77 98  BUN 99* 83* 93* 45* 53*  53* 58*  CREATININE 2.73* 2.70* 2.84* 1.93* 2.38*  2.40* 2.35*  CALCIUM  8.9 9.1 8.6* 8.4* 8.6*  8.5* 9.0  MG 2.4  --   --   --   --   --   PHOS 4.7* 4.6 4.1 3.6 4.2 3.9  ALBUMIN  3.3* 3.2* 3.2* 3.1* 3.1* 3.2*    Liver Function Tests: Recent Labs  Lab 12/24/24 1157 12/25/24 0552 12/29/24 0512 12/30/24 0429 12/31/24 0353  AST 48*  --   --   --   --   ALT 34  --   --   --   --   CICERO  136*  --   --   --   --   BILITOT 0.3  --   --   --   --   PROT 7.1  --   --   --   --   ALBUMIN  3.2*   < > 3.1* 3.1* 3.2*   < > = values in this interval not displayed.   No results for input(s): LIPASE, AMYLASE in the last 168 hours. No results for input(s): AMMONIA in the last 168 hours. CBC: Recent Labs    12/24/24 1157 12/25/24 0552 12/29/24 0512 12/30/24 0429 12/31/24 0354  HGB 9.2* 8.9* 7.8* 7.5* 7.8*  MCV 90.4 91.9 90.7 91.1 93.9    Cardiac Enzymes: No results for input(s): CKTOTAL, CKMB, CKMBINDEX, TROPONINI in the last 168 hours. CBG: Recent Labs  Lab 12/30/24 0829 12/30/24 1138 12/30/24 1621 12/30/24 2140 12/31/24 0621  GLUCAP 211* 178* 145* 192* 106*    Iron  Studies: No results for input(s): IRON , TIBC, TRANSFERRIN, FERRITIN in the last 72 hours. Studies/Results: No results found.  Medications: Infusions:   Scheduled  Medications:  (feeding supplement) PROSource Plus  60 mL Oral BID BM   acidophilus  1 capsule Oral Daily   apixaban   2.5 mg Oral BID   vitamin C   1,000 mg Oral Daily   atorvastatin   80 mg Oral Daily   Chlorhexidine  Gluconate Cloth  6 each Topical BID   ferrous sulfate   325 mg Oral Q breakfast   gabapentin   100 mg Oral QHS   Gerhardt's butt cream  1 Application Topical TID   insulin  aspart  0-15 Units Subcutaneous TID AC & HS   insulin  glargine  18 Units Subcutaneous BID   midodrine   5 mg Oral TID WC   multivitamin with minerals  1 tablet Oral q AM   nutrition supplement (JUVEN)  1 packet Oral BID BM   mouth rinse  15 mL Mouth Rinse 4 times per day   pantoprazole   40 mg Oral BID   ramelteon   8 mg Oral QHS   sodium chloride  flush  10-40 mL Intracatheter Q12H   sodium zirconium cyclosilicate   10 g Oral Daily   tamsulosin   0.4 mg Oral QPC supper    have reviewed scheduled and prn medications.  Physical Exam: General:NAD, able to lie flat comfortably. Heart:RRR, s1s2 nl Lungs:clear b/l, no crackle Abdomen:soft, Non-tender, non-distended Extremities:No edema, right BKA, left BKA with wound VAC Dialysis Access: Left IJ temporary HD catheter.  Yaniel Limbaugh Prasad Ludger Bones 12/31/2024,10:26 AM  LOS: 6 days

## 2024-12-31 NOTE — Progress Notes (Signed)
 Speech Language Pathology Daily Session Note  Patient Details  Name: Shane [name] MRN: [id number] Date of Birth: Apr 14, 1941  Today's Date: 12/31/2024 SLP Individual Time: 1400-1458 SLP Individual Time Calculation (min): 58 min  Short Term Goals: Week 1: SLP Short Term Goal 1 (Week 1): Pt will participate in MBSS to identify least restrictive diet SLP Short Term Goal 2 (Week 1): Pt will solve mildly complex problems w/ supervisionA SLP Short Term Goal 3 (Week 1): Pt will recall recent/relevant information w/ supervisionA  Skilled Therapeutic Interventions: SLP conducted skilled therapy session targeting cognitive goals. Patient reports that he feels cognitive skills are nearly back to baseline. SLP facilitated deduction puzzle of mild to moderate complexity where patient utilized provided clues to fill in information about various individuals with supervision assist. Patient then completed mildly complex multi-step problem solving sequence with supervision fading to modI. Patient was left in room with call bell in reach and alarm set. SLP will continue to target goals per plan of care.        Pain Pain Assessment Pain Scale: 0-10 Pain Score: 7  Pain Location: Back Pain Intervention(s): Medication (See eMAR)  Therapy/Group: Individual Therapy  Shane [name], M.A., CCC-SLP  Shane [name] 12/31/2024, 3:09 PM

## 2024-12-31 NOTE — Progress Notes (Signed)
 Physical Therapy Session Note  Patient Details  Name: Shane [name] MRN: [id number] Date of Birth: [birth year]  Today's Date: 12/31/2024 PT Individual Time: 1045-1200 PT Individual Time Calculation (min): 75 min   Short Term Goals: Week 1:  PT Short Term Goal 1 (Week 1): Pt will perform supine to sit transfers with min A PT Short Term Goal 2 (Week 1): Pt will perform sit to supine transfers with min A PT Short Term Goal 3 (Week 1): Pt will maintain sitting balance EOB with CGA PT Short Term Goal 4 (Week 1): Pt will perform bed <> WC transfers with max A  Skilled Therapeutic Interventions/Progress Updates:     Pt semi-reclined in bed upon arrival. Pt denies pain and agreeable to therapy. Session emphasized functional strengthening, cardiovascular and muscular endurance/activity tolerance, and balance with transfers. Rest breaks provided throughout session due to limited endurance. Pt sat to EOB using hospital bed features with min A. Slight dizziness reported upon sitting to EOB. Pt balanced EOB with L UE support with CGA/ min A without UE support. Pt donned R prosthesis with max A and L limb protector with total A. Pt performed slide board transfer EOB to TIS with mod A. Transported dependent in TIS in main gym. PT removed R prosthesis depednent to practice slide board transfers without prosthesis. Pt practiced placing board with mod/max A and performed transfer TIS <> EOM x2 trials with heavy mod to max A dependent on fatigue level. While seated EOM, pt worked on UE/LE strengthening and dynamic sitting balance. Pt performed 3x10 B LAQ. Pt performed 3x10 horizontal abduction with red TB. Pt performed lateral leans to forearms. Pt worked on seated dynamic balance via multiplanar ball toss with PT. Pt and PT discussed wound healing timeline as well as having his wife or son bring his electric scooter in to practice transfers with. Once back in room, pt remained seated in TIS in tilted position, with  all needs in reach and son present at end of session.  Therapy Documentation Precautions:  Precautions Precautions: Fall Recall of Precautions/Restrictions: Intact Precaution/Restrictions Comments: watch BP wound VAC, LLE splint Restrictions Weight Bearing Restrictions Per Provider Order: Yes LLE Weight Bearing Per Provider Order: Non weight bearing  Therapy/Group: Individual Therapy  Comer CHRISTELLA Levora Comer Levora, PT, DPT 12/31/2024, 7:53 AM

## 2024-12-31 NOTE — Progress Notes (Addendum)
 "                                                        PROGRESS NOTE   Subjective/Complaints: Pt reports he feels well overall this AM. Breathing is doing ok, he did forget to use CPAP last night but plans to use it today.  LBM today.  Dialysis catheter to be removed today  ROS: Patient denies fever, new vision changes, dizziness, nausea, vomiting, diarrhea,  shortness of breath or chest pain, headache, or mood change.   Objective:   No results found.  Recent Labs    12/30/24 0429 12/31/24 0354  WBC 8.5 8.1  HGB 7.5* 7.8*  HCT 22.5* 24.7*  PLT 241 262    Recent Labs    12/30/24 0429 12/31/24 0353  NA 133*  133* 136  K 5.0  5.1 5.5*  CL 95*  95* 98  CO2 32  31 32  GLUCOSE 78  77 98  BUN 53*  53* 58*  CREATININE 2.38*  2.40* 2.35*  CALCIUM  8.6*  8.5* 9.0    Intake/Output Summary (Last 24 hours) at 12/31/2024 1110 Last data filed at 12/31/2024 0900 Gross per 24 hour  Intake 720 ml  Output 1100 ml  Net -380 ml        Physical Exam: Vital Signs Blood pressure (!) 107/50, pulse 72, temperature 97.8 F (36.6 C), temperature source Oral, resp. rate 18, height 5' (1.524 m), weight 98 kg, SpO2 100%.  Constitutional: No distress . Vital signs reviewed. HEENT: NCAT, EOMI, oral membranes moist, Cortrak has been removed  Neck: supple Cardiovascular: RRR without murmur. No JVD    Respiratory/Chest: CTA Bilaterally without wheezes or rales. Normal effort. O2 4L,   GI/Abdomen: BS +, non-tender, non-distended Ext: no clubbing, cyanosis, or edema Psych: pleasant and cooperative  Right BKA well-healed. Left BKA in shrinker with fresh dressing. Leg is well formed with residual edema. Sl tender to palpation BKA incision with only slight serosanguineous drainage, no significant erythema or signs of infection noted Skin: bruising noted on Ues, warm and dry Neuro:   Alert and awake, follows simple commands, CN 2-12 grossly intact, moving all 4 extremities to  gravity.     Assessment/Plan: 1. Functional deficits which require 3+ hours per day of interdisciplinary therapy in a comprehensive inpatient rehab setting. Physiatrist is providing close team supervision and 24 hour management of active medical problems listed below. Physiatrist and rehab team continue to assess barriers to discharge/monitor patient progress toward functional and medical goals  Care Tool:  Bathing    Body parts bathed by patient: Right arm, Left arm, Chest, Abdomen, Face, Front perineal area   Body parts bathed by helper: Buttocks, Right upper leg, Left upper leg, Right lower leg Body parts n/a: Left lower leg   Bathing assist Assist Level: Maximal Assistance - Patient 24 - 49%     Upper Body Dressing/Undressing Upper body dressing   What is the patient wearing?: Hospital gown only    Upper body assist Assist Level: Minimal Assistance - Patient > 75%    Lower Body Dressing/Undressing Lower body dressing      What is the patient wearing?: Hospital gown only     Lower body assist Assist for lower body dressing: Dependent - Patient 0%     Toileting Toileting  Toileting assist Assist for toileting: Dependent - Patient 0%     Transfers Chair/bed transfer  Transfers assist  Chair/bed transfer activity did not occur: Safety/medical concerns (weakness/fatigue/pain/nausea)  Chair/bed transfer assist level: 2 Helpers (slideboard)     Locomotion Ambulation   Ambulation assist   Ambulation activity did not occur: Safety/medical concerns (B BKA)          Walk 10 feet activity   Assist  Walk 10 feet activity did not occur: Safety/medical concerns (B BKA)        Walk 50 feet activity   Assist Walk 50 feet with 2 turns activity did not occur: Safety/medical concerns (B BKA)         Walk 150 feet activity   Assist Walk 150 feet activity did not occur: Safety/medical concerns (B BKA)         Walk 10 feet on uneven surface   activity   Assist Walk 10 feet on uneven surfaces activity did not occur: Safety/medical concerns (B BKA)         Wheelchair     Assist Is the patient using a wheelchair?: Yes Type of Wheelchair: Manual Wheelchair activity did not occur: Safety/medical concerns (weakness/fatigue/pain/nausea)         Wheelchair 50 feet with 2 turns activity    Assist    Wheelchair 50 feet with 2 turns activity did not occur: Safety/medical concerns (weakness/fatigue/pain/nausea)       Wheelchair 150 feet activity     Assist  Wheelchair 150 feet activity did not occur: Safety/medical concerns (weakness/fatigue/pain/nausea)       Blood pressure (!) 107/50, pulse 72, temperature 97.8 F (36.6 C), temperature source Oral, resp. rate 18, height 5' (1.524 m), weight 98 kg, SpO2 100%.  Medical Problem List and Plan: 1. 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  Antithrombotics: -DVT/anticoagulation:  Pharmaceutical: Eliquis              -antiplatelet therapy: N/A 3. Pain Management: Neurontin  100 mg nightly, oxycodone  as needed, Robaxin  as needed  - 1/24 continue as needed oxycodone  for pain control 4. Mood/Behavior/Sleep: Rozerem  8 mg nightly, trazodone  75 mg nightly as needed             -antipsychotic agents: N/A 5. Neuropsych/cognition: This patient is not capable of making decisions on his own behalf. 6. Skin/Wound Care: continue dry dressing and shrinker to left BKA 7. Fluids/Electrolytes/Nutrition: Routine and analysis with follow-up chemistries 8.  History of right BKA 2022.  Patient received CIR.  Intermittent use of prosthesis 9.  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  CRRT 1/10 - 1/12  transition to intermittent hemodialysis.  Follow-up renal services  -1/22-23  reviewed nephrology note, monitoring for need for dialysis  -1/26 labs appear relatively stable  -1/27 BUN and creatinine stable at 58/2.35, discussed with nephrology they plan to sign off.  If renal function does not completely recover he can be referred to outpatient f/u with [facility].  Dialysis catheter to be discontinued -order placed by nephrology . 10.  Paroxysmal atrial fibrillation.  Continue Eliquis .    -  1/24 patient did have vomiting after Eliquis , discussed with pharmacy we will hold off on redosing  - Heart rate stable    12/31/2024    5:41 AM 12/31/2024    5:00 AM 12/30/2024    7:57 PM  Vitals with BMI  Weight  216 lbs 1 oz   BMI  42.19   Systolic 107  115  Diastolic 50  48  Pulse 72  60    11.  Acute on chronic anemia.  Follow-up CBC.  Continue iron  supplement  - 1/25 Hemoglobin lower at 7.8, recheck tomorrow   -1/26- hgb with further dip to 7.5, likely multifactorial, normal MCV   -will check one stool for OB  1/27 hemoglobin back up to 7.8, stool OB pending.  Will remind nurses to check with next bowel movement 12.  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  NovoLog  4 units every 4 hours, Lantus  insulin  18 units twice daily. -1/22 had mild hypoglycemia yesterday, decrease novolog  to 2 units Q4h -1/23-27 stable continue current regimen monitor CBG (last 3)  Recent Labs    12/30/24 2140 12/31/24 0621 12/31/24 1100  GLUCAP 192* 106* 142*    13.  Hypotension.  ProAmatine  5 mg every 8 hours.  Monitor with increased mobility  - 1/23-27 BP soft but overall stable continue to monitor    12/31/2024    5:41 AM 12/31/2024    5:00 AM 12/30/2024    7:57 PM  Vitals with BMI  Weight  216 lbs 1 oz   BMI  42.19   Systolic 107  115  Diastolic 50  48  Pulse 72  60    14.  Chronic systolic congestive heart failure.  Echocardiogram with ejection fraction of  30 to 35%.  Monitor for any signs of fluid overload. Daily weights -1/23 no signs of overload noted, difficult to assess weight trend suspect inaccurate readings -1/24 does not appear to have signs of fluid overload, dialysis today per nephrology   Filed Weights   12/29/24 1432 12/30/24 0409 12/31/24 0500  Weight: 96.7 kg 97.5 kg 98 kg     15.  CAD/AV block/Mobitz 1/aortic stenosis/CABG 2021.  Follow-up cardiology services. 16.  Idiopathic pulmonary fibrosis/OSA.  Followed by Dr. Geronimo.  Patient with 2 L oxygen intermittently at home.  Currently not on CPAP until tube feeds discontinued  - 1/25 consider restarting CPAP if NG tube removed, breathing treatments helping   1/26 resume CPAP today  1/27 patient was ordered for CPAP, he forgot to start yesterday plans to use tonight 17.  Oropharyngeal dysphagia.  Follow-up speech therapy.  Currently with Cortrak feeding tube for nutritional support.  Advance diet as tolerated. Currently NPO with ice chips allowed  - 1/23 diet was upgraded today to D3 thin by SLP  -1/25 will hold tube feedings(pt was declining also), patient would like to try eating orally especially now that nausea is improved.  Monitor intake could consider removing tube tomorrow if doing well.   1/26 eating well enough. Dc TF. Pt aware that he needs to push po intake.   1/27 eating most of his meals, continue to monitor 18.  Diarrhea.  Related to tube feeds.  Flexi-Seal in place: continue Continue Imodium .  -1/23 LBM 1/21, improved continue to follow-up  -1/25 LBM  --- improved overall  1/27 LBM today, remains stable   19.  GERD: continue Protonix    20.  Hyperlipidemia: continue Lipitor   21. Itching.  -Continue atarax  PRN  -sarna PRN  22.  Hyperkalemia, hyponatremia -1/23 nephrology following has ordered Lokelma , appreciate assistance Improved -1/27 potassium elevated today, nephrology ordered Lokelma  daily, continue to monitor trend Diet adjusted to low K  23.  Nausea  -hold tube feeds for now, nephrology planning dialysis today as this may be potential cause.  Abdominal x-ray ordered-no obstruction/ileus    -1/25-27 nausea has improved, continue current regimen  LOS: 6 days A FACE TO FACE EVALUATION WAS PERFORMED  Murray Collier 12/31/2024, 11:10 AM     "

## 2025-01-01 DIAGNOSIS — R110 Nausea: Secondary | ICD-10-CM

## 2025-01-01 DIAGNOSIS — Z794 Long term (current) use of insulin: Secondary | ICD-10-CM | POA: Diagnosis not present

## 2025-01-01 DIAGNOSIS — R197 Diarrhea, unspecified: Secondary | ICD-10-CM | POA: Diagnosis not present

## 2025-01-01 DIAGNOSIS — D649 Anemia, unspecified: Secondary | ICD-10-CM

## 2025-01-01 DIAGNOSIS — Z951 Presence of aortocoronary bypass graft: Secondary | ICD-10-CM

## 2025-01-01 DIAGNOSIS — G4733 Obstructive sleep apnea (adult) (pediatric): Secondary | ICD-10-CM | POA: Diagnosis not present

## 2025-01-01 DIAGNOSIS — N183 Chronic kidney disease, stage 3 unspecified: Secondary | ICD-10-CM | POA: Diagnosis not present

## 2025-01-01 DIAGNOSIS — Z7901 Long term (current) use of anticoagulants: Secondary | ICD-10-CM

## 2025-01-01 DIAGNOSIS — N179 Acute kidney failure, unspecified: Secondary | ICD-10-CM | POA: Diagnosis not present

## 2025-01-01 DIAGNOSIS — E11649 Type 2 diabetes mellitus with hypoglycemia without coma: Secondary | ICD-10-CM | POA: Diagnosis not present

## 2025-01-01 DIAGNOSIS — Z89512 Acquired absence of left leg below knee: Secondary | ICD-10-CM | POA: Diagnosis not present

## 2025-01-01 DIAGNOSIS — R1030 Lower abdominal pain, unspecified: Secondary | ICD-10-CM

## 2025-01-01 DIAGNOSIS — I2510 Atherosclerotic heart disease of native coronary artery without angina pectoris: Secondary | ICD-10-CM

## 2025-01-01 DIAGNOSIS — R1319 Other dysphagia: Secondary | ICD-10-CM

## 2025-01-01 DIAGNOSIS — R112 Nausea with vomiting, unspecified: Secondary | ICD-10-CM | POA: Diagnosis not present

## 2025-01-01 DIAGNOSIS — R195 Other fecal abnormalities: Secondary | ICD-10-CM

## 2025-01-01 LAB — CBC
HCT: 25.4 % — ABNORMAL LOW (ref 39.0–52.0)
Hemoglobin: 8.2 g/dL — ABNORMAL LOW (ref 13.0–17.0)
MCH: 29.8 pg (ref 26.0–34.0)
MCHC: 32.3 g/dL (ref 30.0–36.0)
MCV: 92.4 fL (ref 80.0–100.0)
Platelets: 254 10*3/uL (ref 150–400)
RBC: 2.75 MIL/uL — ABNORMAL LOW (ref 4.22–5.81)
RDW: 14.6 % (ref 11.5–15.5)
WBC: 7.6 10*3/uL (ref 4.0–10.5)
nRBC: 0 % (ref 0.0–0.2)

## 2025-01-01 LAB — VITAMIN B12: Vitamin B-12: 1173 pg/mL — ABNORMAL HIGH (ref 180–914)

## 2025-01-01 LAB — BASIC METABOLIC PANEL WITH GFR
Anion gap: 7 (ref 5–15)
BUN: 65 mg/dL — ABNORMAL HIGH (ref 8–23)
CO2: 31 mmol/L (ref 22–32)
Calcium: 8.8 mg/dL — ABNORMAL LOW (ref 8.9–10.3)
Chloride: 95 mmol/L — ABNORMAL LOW (ref 98–111)
Creatinine, Ser: 2.52 mg/dL — ABNORMAL HIGH (ref 0.61–1.24)
GFR, Estimated: 25 mL/min — ABNORMAL LOW
Glucose, Bld: 164 mg/dL — ABNORMAL HIGH (ref 70–99)
Potassium: 5.1 mmol/L (ref 3.5–5.1)
Sodium: 133 mmol/L — ABNORMAL LOW (ref 135–145)

## 2025-01-01 LAB — GLUCOSE, CAPILLARY
Glucose-Capillary: 117 mg/dL — ABNORMAL HIGH (ref 70–99)
Glucose-Capillary: 107 mg/dL — ABNORMAL HIGH (ref 70–99)
Glucose-Capillary: 134 mg/dL — ABNORMAL HIGH (ref 70–99)
Glucose-Capillary: 183 mg/dL — ABNORMAL HIGH (ref 70–99)

## 2025-01-01 LAB — RETICULOCYTES
Immature Retic Fract: 20.7 % — ABNORMAL HIGH (ref 2.3–15.9)
RBC.: 2.76 MIL/uL — ABNORMAL LOW (ref 4.22–5.81)
Retic Count, Absolute: 58.5 10*3/uL (ref 19.0–186.0)
Retic Ct Pct: 2.1 % (ref 0.4–3.1)

## 2025-01-01 LAB — OCCULT BLOOD X 1 CARD TO LAB, STOOL: Fecal Occult Bld: POSITIVE — AB

## 2025-01-01 LAB — FERRITIN: Ferritin: 188 ng/mL (ref 24–336)

## 2025-01-01 LAB — IRON AND TIBC
Iron: 52 ug/dL (ref 45–182)
Saturation Ratios: 18 % (ref 17.9–39.5)
TIBC: 286 ug/dL (ref 250–450)
UIBC: 234 ug/dL

## 2025-01-01 LAB — FOLATE: Folate: >20 ng/mL

## 2025-01-01 MED ORDER — NYSTATIN 100000 UNIT/GM EX POWD
Freq: Three times a day (TID) | CUTANEOUS | Status: DC
  Filled 2025-01-01 (×2): qty 15

## 2025-01-01 MED ORDER — VITAMIN C 500 MG PO TABS
1000.0000 mg | ORAL_TABLET | Freq: Every day | ORAL | Status: DC
  Administered 2025-01-02 – 2025-01-09 (×8): 1000 mg via ORAL
  Filled 2025-01-01 (×8): qty 2

## 2025-01-01 MED ORDER — FERROUS SULFATE 325 (65 FE) MG PO TABS: 325.0000 mg | ORAL_TABLET | ORAL | Status: DC

## 2025-01-01 MED ORDER — GABAPENTIN 100 MG PO CAPS
100.0000 mg | ORAL_CAPSULE | Freq: Three times a day (TID) | ORAL | Status: DC
  Administered 2025-01-01 – 2025-01-10 (×27): 100 mg via ORAL
  Filled 2025-01-01 (×27): qty 1

## 2025-01-01 MED ORDER — FERROUS SULFATE 325 (65 FE) MG PO TABS
325.0000 mg | ORAL_TABLET | ORAL | Status: DC
  Administered 2025-01-03 – 2025-01-09 (×4): 325 mg via ORAL
  Filled 2025-01-01 (×4): qty 1

## 2025-01-01 MED ORDER — ATORVASTATIN CALCIUM 80 MG PO TABS
80.0000 mg | ORAL_TABLET | Freq: Every day | ORAL | Status: DC
  Administered 2025-01-02 – 2025-01-09 (×8): 80 mg via ORAL
  Filled 2025-01-01 (×9): qty 1

## 2025-01-01 NOTE — Consult Note (Cosign Needed Addendum)
 "  Referring Provider: Dr. Murray Collier Primary Care Physician:  Shepard Ade, MD Primary Gastroenterologist:  Dr. Abran  Reason for Consultation:  Anemia  HPI: Shane [name] is a 84 y.o. male with a past medical history of CAD s/p 3 vessel CABG 11/2020, NSTEMI 12/2023, AS s/p TAVR, sick sinus syndrome s/p pacemaker placment, paroxysmal atrial fibrillation, idiopathic pulmonary fibrosis, OSA on CPAP and oxygen 2 L nasal cannula at bedtime, DM type II, CKD stage III, PVD s/p right BKA, anemia of chronic disease, right retroperitoneal bleed 12/2023 and colon polyps.  Patient with history of peripheral arterial disease who failed conservative outpatient therapy for ischemic lower extremity ulcers and left foot with gangrene. Patient was admitted 12/11/2024 with septic shock secondary to having a gangrenous left foot status post left amputation below the knee.  Postoperative course complicated by hypotension for which he was subsequently transferred to the ICU.  He developed AKI and underwent CRRT.  His renal and clinical status stabilized and he was transferred to the medical floor and eventually discharged to [facility] in house rehab 12/25/2024.  GI consult was requested for further evaluation regarding progressive acute on chronic anemia.  FOBT positive.  He endorses having intermittent lower abdominal pain since his admission which comes and goes, pain is not severe and he cannot recall how often it occurs.  He has intermittent nausea without associated vomiting.  No heartburn.  He endorses having intermittent dysphagia which occurs approximately once weekly and started prior to this hospital admission.  Food gets stuck to the midesophagus and he regurgitates or vomits up the stuck food.  If he tries to drink water, the water comes right back up.  Dysphagia symptoms have occurred for at least the past year.  He believes he was taking Pantoprazole  twice daily at home.  He stated his wife manages all of  his medications at home.  In rehab, he is on Pantoprazole  40 mg p.o. twice daily.  He has intermittent anal rectal/gluteal discomfort from raw skin which he stated occurs every time he is hospitalized.  Nursing staff reported his stools have been greenish to black/ brown.  No bright red blood per the rectum.  Currently, he is on Eliquis  for paroxysmal atrial fibrillation. On oral iron  325 mg daily.  CTAP without contrast 12/25/2024 did not identify any acute intra-abdominal/pelvic pathology to explain his anemia or abdominal pain.  CT noted also noted interval decrease in size of the right retroperitoneal collection seen on the previous study.  Patient with a prior history of acute retroperitoneal bleed confirmed per CTAP during prior hospital admission 12/2023 while on Heparin  for NSTEMI and A-fib.  Transfused 2 units of PRBCs, 1 unit of platelets and received DDAVP .  He also had respiratory failure secondary to COVID.  Admission hemoglobin 12.0 on 12/11/2024.  Postoperatively, hemoglobin level continues to drift downward -> Hg 9s -> 8s -> 7.8 on 1/25 -> 7.5 on 1/26 -> 7.8 on 1/27.  He has not received any blood transfusions during hospital admission 1/7 - 12/25/2024 or while in [facility] inpatient [facility].   Swallow study per speech pathology 12/19/2025 who assessed the patient had oropharyngeal dysphagia, likely exacerbated by mentation and at that juncture was not yet ready for p.o. diet.  He required tube feedings.  Modified barium swallow study 12/27/2024 which showed some esophageal retention and a dysphagia 3 diet was recommended.  His most recent colonoscopy was 01/2013 which identified a small tubular adenomatous polyp removed from the colon.  EGD 10/2002  was normal.  CTAP WO contrast 12/25/2024   EXAM: CT ABDOMEN AND PELVIS WITHOUT CONTRAST   TECHNIQUE: Multidetector CT imaging of the abdomen and pelvis was performed following the standard protocol without IV contrast.   RADIATION DOSE REDUCTION:  This exam was performed according to the departmental dose-optimization program which includes automated exposure control, adjustment of the mA and/or kV according to patient size and/or use of iterative reconstruction technique.   COMPARISON:  07/23/2024 of   FINDINGS: Lower chest: Chronic interstitial lung disease noted in both lower lungs is similar to prior. The heart is enlarged. Status post TAVR.   Hepatobiliary: No suspicious focal abnormality in the liver on this study without intravenous contrast. There is no evidence for gallstones, gallbladder wall thickening, or pericholecystic fluid. No intrahepatic or extrahepatic biliary dilation.   Pancreas: No focal mass lesion. No dilatation of the main duct. No intraparenchymal cyst. No peripancreatic edema.   Spleen: No splenomegaly. No suspicious focal mass lesion.   Adrenals/Urinary Tract: No adrenal nodule or mass. Kidneys unremarkable. No evidence for hydroureter. The urinary bladder appears normal for the degree of distention.   Stomach/Bowel: Stomach is unremarkable. No gastric wall thickening. No evidence of outlet obstruction. Feeding tube tip is in the descending duodenum. No small bowel wall thickening. No small bowel dilatation. The terminal ileum is normal. The appendix is normal. No gross colonic mass. No colonic wall thickening. Rectal tube evident.   Vascular/Lymphatic: There is moderate atherosclerotic calcification of the abdominal aorta without aneurysm. There is no gastrohepatic or hepatoduodenal ligament lymphadenopathy. No retroperitoneal or mesenteric lymphadenopathy. No pelvic sidewall lymphadenopathy.   Reproductive: The prostate gland and seminal vesicles are unremarkable.   Other: No substantial intraperitoneal free fluid. Rim enhancing retroperitoneal collection seen on the previous study has decreased in the interval measuring 4.0 x 1.6 cm today compared to 5.3 x 2.7 cm previously. Features  likely reflect resolving hematoma or residual granulation/scar from prior infection.   Musculoskeletal: No worrisome lytic or sclerotic osseous abnormality.   IMPRESSION: 1. No acute findings in the abdomen or pelvis. Specifically, no findings to explain the patient's history of abdominal pain. 2. Interval decrease in size of the right retroperitoneal collection seen on the previous study. Previous study performed with contrast shows some peripheral enhancement of this collection. Features likely reflect chronic resolving hematoma or residual granulation/scar from prior infection. 3. Chronic interstitial lung disease in both lower lungs, similar to prior. 4.  Aortic Atherosclerosis  ECHO 12/12/2024: IMPRESSIONS Left ventricular ejection fraction, by estimation, is 30 to 35%. The left ventricle has moderately decreased function. Left ventricular endocardial border not optimally defined to evaluate regional wall motion. The left ventricular internal cavity size was mildly dilated. There is mild left ventricular hypertrophy. Left ventricular diastolic parameters are consistent with Grade III diastolic dysfunction (restrictive). 1. Right ventricular systolic function was not well visualized. The right ventricular size is not well visualized. There is moderately elevated pulmonary artery systolic pressure. 2. 3. The mitral valve was not well visualized. Mild mitral valve regurgitation. The aortic valve has been repaired/replaced. Aortic valve regurgitation is not visualized. 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 Procedure Date: 08/16/2023. Echo findings are consistent with normal structure and function of the aortic valve prosthesis. 4. The inferior vena cava is dilated in size with <50% respiratory variability, suggesting right atrial pressure of 15 mmHg.   GI PROCEDURES:  Colonoscopy 01/14/2013 by Dr. Abran: Diminutive tubular adenomatous polyp removed  from the  descending colon Repeat colonoscopy 5 years  Colonoscopy 11/15/2006: 3 mm rectal tubular adenomatous polyp Diverticulosis  EGD 10/14/2002: Normal EGD  Colonoscopy 10/14/2002: 2 mm polyp removed from the cecum Diverticulosis Cecal polyp: Leiomyoma Random biopsies unremarkable, colonic mucosa with focal lymphoid aggregates  Past Medical History:  Diagnosis Date   Acute blood loss anemia    Anxiety    AV block, Mobitz 1    Cataract    Chronic kidney disease    d/t DM   CKD (chronic kidney disease), stage III (HCC)    COVID-19 virus infection 07/18/2020   Last Assessment & Plan:   Formatting of this note might be different from the original.  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 10/2019 Echo: EF 55-60%, Gr2 DD. Nl RV.    Neuromuscular disorder (HCC)    Neuropathy associated with endocrine disorder    Nonobstructive CAD (coronary artery disease)    a. 2012 Cath: mod, nonobs dzs; b. 10/2016 MV: EF 60%, no ischemia.   OSA on CPAP 05/05/2017   Unattended Home Sleep Test 7/2/813-AHI 38.6/hour, desaturation to 64%, body weight 261 pounds   PONV (postoperative nausea and vomiting)    Postoperative anemia due to acute blood loss 11/07/2020   Postoperative hemorrhagic shock 03/20/2021   Sleep apnea     uses cpap asked to bring mask and tubing    Past Surgical History:  Procedure Laterality Date   ABDOMINAL AORTOGRAM W/LOWER EXTREMITY N/A 12/10/2020   Procedure: ABDOMINAL AORTOGRAM W/LOWER EXTREMITY;  Surgeon:  Magda Debby SAILOR, MD;  Location: [facility] INVASIVE CV LAB;  Service: Cardiovascular;  Laterality: N/A;   AMPUTATION Right 01/22/2021   Procedure: 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;  Surgeon: Harden Jerona GAILS, MD;  Location: [facility] OR;  Service: Orthopedics;  Laterality: Right;   AMPUTATION Right 03/17/2021   Procedure: RIGHT BELOW KNEE AMPUTATION;  Surgeon: Harden Jerona GAILS, MD;  Location: [facility] OR;  Service: Orthopedics;  Laterality: Right;   AMPUTATION Left 12/11/2024   Procedure: AMPUTATION BELOW KNEE;  Surgeon: Harden Jerona GAILS, MD;  Location: [facility] OR;  Service: Orthopedics;  Laterality: Left;   ANKLE FUSION Right 01/22/2021   Procedure: RIGHT FOOT TIBIOCALCANEAL FUSION;  Surgeon: Harden Jerona GAILS, MD;  Location: [facility] OR;  Service: Orthopedics;  Laterality: Right;   ANTERIOR FUSION CERVICAL SPINE  2012   APPLICATION OF WOUND VAC Left 12/11/2024   Procedure: APPLICATION, WOUND VAC;  Surgeon: Harden Jerona GAILS, MD;  Location: [facility] OR;  Service: Orthopedics;  Laterality: Left;   CARDIAC CATHETERIZATION  2011   CARDIAC CATHETERIZATION N/A 11/09/2016   Procedure: Right Heart Cath;  Surgeon: Victory LELON Sharps, MD;  Location: [facility] INVASIVE CV LAB;  Service: Cardiovascular;  Laterality: N/A;   carpel tunnel     left wrist   CATARACT EXTRACTION     CATARACT EXTRACTION W/ INTRAOCULAR LENS  IMPLANT, BILATERAL  2013   CERVICAL LAMINECTOMY  2012   COLONOSCOPY N/A 01/14/2013   Procedure: COLONOSCOPY;  Surgeon: Norleen LOISE Kiang, MD;  Location: [facility];  Service: Endoscopy;  Laterality: N/A;   CORONARY ARTERY BYPASS GRAFT  11/04/2020   LIMA-LAD, SVG-OM1, SVG-PDA (Dr Norleen Exon [facility]) dc 11/18/2020   EYE SURGERY     I & D EXTREMITY Right 02/19/2021   Procedure: RIGHT ANKLE DEBRIDEMENT AND PLACEMENT ANTIBIOTIC BEADS;  Surgeon: Harden Jerona GAILS, MD;  Location: [facility] OR;  Service: Orthopedics;  Laterality: Right;   KNEE SURGERY  1998   left   LEFT HEART CATH AND CORONARY ANGIOGRAPHY N/A 07/10/2020   Procedure: LEFT HEART CATH AND CORONARY ANGIOGRAPHY;  Surgeon:  Wonda Sharper, MD;  Location: [facility] INVASIVE CV LAB;  Service: Cardiovascular;  Laterality: N/A;   LUMBAR LAMINECTOMY  2003   LUNG BIOPSY Left 12/26/2016   Procedure: LUNG BIOPSY;  Surgeon: Elspeth JAYSON Millers, MD;  Location[facility] OR;  Service: Thoracic;  Laterality: Left;   PACEMAKER IMPLANT N/A 03/30/2020   Procedure: PACEMAKER IMPLANT;  Surgeon: Waddell Danelle ORN, MD;  Location: [facility]VAS[facility] LAB;  Service: Cardiovascular;  Laterality: N/A;   PERIPHERAL VASCULAR INTERVENTION Right 12/10/2020   Procedure: PERIPHERAL VASCULAR INTERVENTION;  Surgeon: Magda Debby LOISE, MD;  Location: [facility] INVAS[facility] CV LAB;  Service: Cardiovascular;  Laterality: Right;  SFA   POSTERIOR FUSION CERVICAL SPINE  2012   PPM GENERATOR REMOVAL N/A 12/14/2020   Procedure: PPM GENERATOR REMOVAL;  Surgeon: Waddell Danelle ORN, MD;  Location: [facility] INVASIVE CV LAB;  Service: Cardiovascular;  Laterality: N/A;   TEE WITHOUT CARDIOVERSION N/A 12/11/2020   Procedure: TRANSESOPHAGEAL ECHOCARDIOGRAM (TEE);  Surgeon: Barbaraann Darryle Debby, MD;  Location: [facility] ENDOSCOPY;  Service: Cardiovascular;  Laterality: N/A;   TRIGGER FINGER RELEASE  2011   4th finger left hand   VIDEO ASSISTED THORACOSCOPY Left 12/26/2016   Procedure: VIDEO ASSISTED THORACOSCOPY;  Surgeon: Elspeth JAYSON Millers, MD;  Location: [facility] OR;  Service: Thoracic;  Laterality: Left;   VIDEO BRONCHOSCOPY N/A 12/26/2016   Procedure: VIDEO BRONCHOSCOPY;  Surgeon: Elspeth JAYSON Millers, MD;  Location: [facility] OR;  Service: Thoracic;  Laterality: N/A;    Prior to Admission medications  Medication Sig Start Date End Date Taking? Authorizing Provider  acetaminophen  (TYLENOL ) 160 MG/5ML solution Place 15.6 mLs (500 mg total) into feeding tube 3 (three) times daily. 12/25/24   Jillian Buttery, MD  albuterol  (PROVENTIL ) (2.5 MG/3ML) 0.083% nebulizer solution Take 2.5 mg by nebulization 2 (two) times daily as needed for wheezing or shortness of breath.    [provider]  albuterol  (VENTOLIN  HFA)  108 (90 Base) MCG/ACT inhaler Inhale 2 puffs into the lungs every 4 (four) hours as needed for wheezing or shortness of breath. 04/12/21   Angiulli, Toribio PARAS, PA-C  apixaban  (ELIQUIS ) 2.5 MG TABS tablet Place 1 tablet (2.5 mg total) into feeding tube 2 (two) times daily. 12/25/24   Jillian Buttery, MD  ascorbic acid  (VITAMIN C ) 1000 MG tablet Place 1 tablet (1,000 mg total) into feeding tube daily. 12/26/24   Jillian Buttery, MD  atorvastatin  (LIPITOR ) 80 MG tablet Place 1 tablet (80 mg total) into feeding tube daily. 12/26/24   Jillian Buttery, MD  dextromethorphan  (DELSYM ) 30 MG/5ML liquid Place 2.5 mLs (15 mg total) into feeding tube daily as needed for cough. 12/25/24   Jillian Buttery, MD  EPINEPHrine  0.3 mg/0.3 mL IJ SOAJ injection Inject 0.3 mg into the muscle as needed for anaphylaxis. 07/26/24   Ghimire, Donalda HERO, MD  escitalopram  (LEXAPRO ) 10 MG tablet Take 1 tablet (10 mg total) by mouth daily. 04/12/21  Angiulli, Toribio PARAS, PA-C  ferrous sulfate  300 (60 Fe) MG/5ML syrup Place 5 mLs (300 mg total) into feeding tube daily with breakfast. 12/26/24   Jillian Buttery, MD  gabapentin  (NEURONTIN ) 250 MG/5ML solution Place 2 mLs (100 mg total) into feeding tube at bedtime. 12/25/24   Jillian Buttery, MD  hydrOXYzine  (ATARAX ) 25 MG tablet Place 1 tablet (25 mg total) into feeding tube 3 (three) times daily as needed for anxiety. 12/25/24   Jillian Buttery, MD  insulin  aspart (NOVOLOG ) 100 UNIT/ML injection Inject 4 Units into the skin every 4 (four) hours. 12/25/24   Jillian Buttery, MD  insulin  aspart (NOVOLOG ) 100 UNIT/ML injection Inject 0-15 Units into the skin every 4 (four) hours. 12/25/24   Jillian Buttery, MD  insulin  glargine (LANTUS ) 100 UNIT/ML injection Inject 0.18 mLs (18 Units total) into the skin 2 (two) times daily. 12/25/24   Jillian Buttery, MD  loperamide  HCl (IMODIUM ) 1 MG/7.5ML suspension Place 30 mLs (4 mg total) into feeding tube 3 (three) times daily as needed for diarrhea or loose stools.  12/25/24   Jillian Buttery, MD  methocarbamol  (ROBAXIN ) 500 MG tablet Place 1 tablet (500 mg total) into feeding tube every 8 (eight) hours as needed for muscle spasms. 12/25/24   Jillian Buttery, MD  midodrine  (PROAMATINE ) 5 MG tablet Place 1 tablet (5 mg total) into feeding tube every 8 (eight) hours. 12/25/24   Adhikari, Amrit, MD  Multiple Vitamin (MULTIVITAMIN WITH MINERALS) TABS tablet Place 1 tablet into feeding tube in the morning. 12/26/24   Jillian Buttery, MD  naphazoline-glycerin  (CLEAR EYES REDNESS) SOLN Place 1-2 drops into both eyes 4 (four) times daily as needed for eye irritation. 12/25/24   Jillian Buttery, MD  nutrition supplement, JUVEN, (JUVEN) PACK Place 1 packet into feeding tube 2 (two) times daily between meals. 12/25/24   Jillian Buttery, MD  Nutritional Supplements (FEEDING SUPPLEMENT, KATE FARMS STANDARD ENT 1.4,) LIQD liquid Place 1,000 mLs into feeding tube continuous. 12/25/24   Jillian Buttery, MD  Olopatadine  HCl (PATADAY  OP) Place 2 drops into both eyes daily as needed (allergies).    [provider]  oxyCODONE  (OXY IR/ROXICODONE ) 5 MG immediate release tablet Place 0.5 tablets (2.5 mg total) into feeding tube every 4 (four) hours as needed for severe pain (pain score 7-10). 12/25/24   Jillian Buttery, MD  OXYGEN Inhale 2 L into the lungs See admin instructions. Inhale 3L of Oxygen everynight at bedside. May uses throughout the day as needed.    [provider]  pantoprazole  (PROTONIX ) 40 MG injection Inject 40 mg into the vein 2 (two) times daily. 12/25/24   Adhikari, Amrit, MD  polyethylene glycol powder (GLYCOLAX /MIRALAX ) 17 GM/SCOOP powder Take 17 g by mouth daily as needed for mild constipation or moderate constipation. 11/15/20   [provider]  Probiotic Product (ALIGN) 10 MG CAPS Take 10 mg by mouth daily.    [provider]  Protein (FEEDING SUPPLEMENT, PROSOURCE TF20,) liquid Place 60 mLs into feeding tube 2 (two) times daily.  12/25/24   Jillian Buttery, MD  ramelteon  (ROZEREM ) 8 MG tablet Take 1 tablet (8 mg total) by mouth at bedtime. 12/25/24   Jillian Buttery, MD  tamsulosin  (FLOMAX ) 0.4 MG CAPS capsule Take 1 capsule (0.4 mg total) by mouth daily after supper. 12/25/24   Jillian Buttery, MD  traZODone  (DESYREL ) 150 MG tablet Place 0.5 tablets (75 mg total) into feeding tube at bedtime as needed for sleep. 12/25/24   Adhikari, Amrit, MD  triamcinolone cream (KENALOG)  0.1 % Apply 1 Application topically daily as needed (Rash).    [provider]  zinc  sulfate 220 (50 Zn) MG capsule Take 1 capsule (220 mg total) by mouth daily. 04/12/21   Angiulli, Toribio PARAS, PA-C    Current Facility-Administered Medications  Medication Dose Route Frequency Provider Last Rate Last Admin   acetaminophen  (TYLENOL ) tablet 325-650 mg  325-650 mg Oral Q6H PRN Pierce, Dwayne A, RPH   650 mg at 01/01/25 0206   acidophilus (RISAQUAD) capsule 1 capsule  1 capsule Oral Daily Angiulli, Daniel J, PA-C   1 capsule at 01/01/25 9152   albuterol  (PROVENTIL ) (2.5 MG/3ML) 0.083% nebulizer solution 2.5 mg  2.5 mg Nebulization Q4H PRN Angiulli, Daniel J, PA-C   2.5 mg at 12/29/24 1547   apixaban  (ELIQUIS ) tablet 2.5 mg  2.5 mg Oral BID [name] Kava A, RPH   2.5 mg at 01/01/25 0848   [START ON 01/02/2025] ascorbic acid  (VITAMIN C ) tablet 1,000 mg  1,000 mg Oral Q lunch Urbano Albright, MD       atorvastatin  (LIPITOR ) tablet 80 mg  80 mg Oral Daily [name] Kava A, RPH   80 mg at 01/01/25 0848   camphor-menthol  (SARNA) lotion 1 Application  1 Application Topical PRN Urbano Albright, MD       dextromethorphan  (DELSYM ) 30 MG/5ML liquid 15 mg  15 mg Oral Daily PRN [name], Dwayne A, RPH       feeding supplement (NEPRO CARB STEADY) liquid 237 mL  237 mL Oral TID BM Urbano Albright, MD   237 mL at 01/01/25 1023   [START ON 01/03/2025] ferrous sulfate  tablet 325 mg  325 mg Oral QODAY Shtridelman, Yuri, MD       gabapentin  (NEURONTIN ) capsule 100 mg  100  mg Oral TID Urbano Albright, MD       Gerhardt's butt cream 1 Application  1 Application Topical TID Angiulli, Daniel J, PA-C   1 Application at 01/01/25 9143   hydrOXYzine  (ATARAX ) tablet 25 mg  25 mg Oral TID PRN Love, Pamela S, PA-C   25 mg at 01/01/25 9685   insulin  aspart (novoLOG ) injection 0-15 Units  0-15 Units Subcutaneous TID AC & HS Angiulli, Daniel J, PA-C   3 Units at 12/31/24 2244   insulin  glargine (LANTUS ) injection 18 Units  18 Units Subcutaneous BID Angiulli, Daniel J, PA-C   18 Units at 01/01/25 9152   loperamide  (IMODIUM ) capsule 4 mg  4 mg Oral TID PRN [name] Kava LABOR, RPH       methocarbamol  (ROBAXIN ) tablet 500 mg  500 mg Oral Q8H PRN [name] Kava A, RPH   500 mg at 12/30/24 2130   midodrine  (PROAMATINE ) tablet 5 mg  5 mg Oral TID WC Pierce, Dwayne A, RPH   5 mg at 01/01/25 1235   multivitamin with minerals tablet 1 tablet  1 tablet Oral q AM [name], Dwayne A, RPH   1 tablet at 01/01/25 1023   mupirocin  ointment (BACTROBAN ) 2 % 1 Application  1 Application Topical Daily PRN Angiulli, Daniel J, PA-C       naphazoline-glycerin  (CLEAR EYES REDNESS) ophth solution 1-2 drop  1-2 drop Both Eyes QID PRN Angiulli, Daniel J, PA-C       olopatadine  (PATANOL) 0.1 % ophthalmic solution 1 drop  1 drop Both Eyes Daily PRN Angiulli, Daniel J, PA-C       ondansetron  (ZOFRAN ) injection 4 mg  4 mg Intravenous Q6H PRN Pegge Toribio PARAS, PA-C   4 mg at 01/01/25  9151   Oral care mouth rinse  15 mL Mouth Rinse 4 times per day Pegge Toribio PARAS, PA-C   15 mL at 01/01/25 1235   Oral care mouth rinse  15 mL Mouth Rinse PRN Angiulli, Toribio PARAS, PA-C       oxyCODONE  (Oxy IR/ROXICODONE ) immediate release tablet 2.5 mg  2.5 mg Oral Q4H PRN [name] Kava A, RPH   2.5 mg at 01/01/25 9685   pantoprazole  (PROTONIX ) EC tablet 40 mg  40 mg Oral BID Urbano Albright, MD   40 mg at 01/01/25 0848   phenol (CHLORASEPTIC) mouth spray 1 spray  1 spray Mouth/Throat PRN Pegge Toribio PARAS, PA-C       ramelteon   (ROZEREM ) tablet 8 mg  8 mg Oral QHS Pierce, Dwayne A, RPH   8 mg at 12/31/24 2033   sodium chloride  flush (NS) 0.9 % injection 10-40 mL  10-40 mL Intracatheter Q12H Urbano Albright, MD   10 mL at 01/01/25 0857   sodium chloride  flush (NS) 0.9 % injection 10-40 mL  10-40 mL Intracatheter PRN Urbano Albright, MD       sodium zirconium cyclosilicate  (LOKELMA ) packet 10 g  10 g Oral Daily Dolan Mateo Larger, MD   10 g at 01/01/25 9152   tamsulosin  (FLOMAX ) capsule 0.4 mg  0.4 mg Oral QPC supper Pegge Toribio PARAS, PA-C   0.4 mg at 12/31/24 1653   traZODone  (DESYREL ) tablet 75 mg  75 mg Oral QHS PRN [name] Kava LABOR, RPH   75 mg at 12/31/24 2033    Allergies as of 12/25/2024 - Review Complete 12/25/2024  Allergen Reaction Noted   Codeine Hives and Itching 11/11/2011   Ofev  [nintedanib ] Diarrhea 03/26/2018   Pirfenidone  Diarrhea and Other (See Comments) 06/14/2017   Amoxicillin Itching 07/23/2024    Family History  Problem Relation Age of Onset   Diabetes Mellitus II Mother    Emphysema Father 42   Heart attack Father    Colon cancer Neg Hx    Esophageal cancer Neg Hx    Rectal cancer Neg Hx    Stomach cancer Neg Hx     Social History   Socioeconomic History   Marital status: Married    Spouse name: Not on file   Number of children: Not on file   Years of education: Not on file   Highest education level: Not on file  Occupational History   Not on file  Tobacco Use   Smoking status: Never    Passive exposure: Past   Smokeless tobacco: Never  Vaping Use   Vaping status: Never Used  Substance and Sexual Activity   Alcohol use: No   Drug use: No   Sexual activity: Not Currently  Other Topics Concern   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    Social Drivers of Health   Tobacco Use: Low Risk (12/25/2024)   Patient History    Smoking Tobacco Use: Never    Smokeless Tobacco Use: Never    Passive Exposure: Past  Financial Resource  Strain: Low Risk  (08/22/2023)   Received from [facility]   Overall Financial Resource Strain (CARDIA)    Difficulty of Paying Living Expenses: Not hard at all  Food Insecurity: No Food Insecurity (12/31/2024)   Epic    Worried About Running Out of Food in the Last Year: Never true    Ran Out of Food in the Last Year: Never true  Transportation  Needs: No Transportation Needs (12/31/2024)   Epic    Lack of Transportation (Medical): No    Lack of Transportation (Non-Medical): No  Physical Activity: Not on file  Stress: Not on file  Social Connections: Moderately Integrated (12/31/2024)   Social Connection and Isolation Panel    Frequency of Communication with Friends and Family: Once a week    Frequency of Social Gatherings with Friends and Family: More than three times a week    Attends Religious Services: Never    Database Administrator or Organizations: Yes    Attends Banker Meetings: Never    Marital Status: Married  Catering Manager Violence: Not At Risk (12/31/2024)   Epic    Fear of Current or Ex-Partner: No    Emotionally Abused: No    Physically Abused: No    Sexually Abused: No  Depression (PHQ2-9): Not on file  Alcohol Screen: Not on file  Housing: Low Risk (12/31/2024)   Epic    Unable to Pay for Housing in the Last Year: No    Number of Times Moved in the Last Year: 0    Homeless in the Last Year: No  Utilities: Not At Risk (12/31/2024)   Epic    Threatened with loss of utilities: No  Health Literacy: Not on file   Review of Systems: Gen: Denies fever, sweats or chills. ? weight loss.  CV: Denies chest pain, palpitations or edema. Resp: Denies cough, shortness of breath of hemoptysis.  GI: See HPI.  GU : Denies urinary burning, blood in urine, increased urinary frequency or incontinence. MS: Denies joint pain, muscles aches or weakness. Derm: Raw skin around buttocks and anus.  Psych: Denies depression, anxiety, memory loss or  confusion. Heme: + Easy bruising.  Neuro:  Denies headaches, dizziness or paresthesias. Endo: + DM type II.   Physical Exam: Vital signs in last 24 hours: Temp:  [97.8 F (36.6 C)-98 F (36.7 C)] 98 F (36.7 C) (01/28 0511) Pulse Rate:  [60-70] 60 (01/28 0511) Resp:  [18] 18 (01/28 0511) BP: (104-107)/(46-54) 107/54 (01/28 0511) SpO2:  [99 %-100 %] 100 % (01/28 0511) Weight:  [100 kg] 100 kg (01/28 0500) Last BM Date : 12/31/24 General:  Alert 84 year old male in no acute distress. Head:  Normocephalic and atraumatic. Eyes:  No scleral icterus. Conjunctiva pink. Ears:  Normal auditory acuity. Nose:  No deformity, discharge or lesions. Mouth:  Dentition intact. No ulcers or lesions.  Neck:  Supple. No lymphadenopathy or thyromegaly.  Lungs: Breath sounds clear throughout. No wheezes, rhonchi or crackles.  On oxygen 4 L nasal cannula. Heart: Regular rate and rhythm, no murmurs. Abdomen: Soft, nondistended.  Mild RLQ and central lower abdominal tenderness without rebound or guarding.  Positive bowel sounds to all 4 quadrants.  No bruit. Rectal: Perianal/gluteal derm inflamed and raw without exudate.  No external hemorrhoids or fissures.  Rectal vault is filled with soft dark green/brown stool which limits exam.  Charge RN at bedside at time of exam. Musculoskeletal: Bilateral BKA, left BKA dressing dry and intact. Pulses:  Normal pulses noted. Extremities:  Without clubbing or edema.  Neurologic:  Alert and  oriented x 4. No focal deficits.  Skin: Scattered patches of ecchymosis to the upper extremities. Psych:  Alert and cooperative. Normal mood and affect.  Intake/Output from previous day: 01/27 0701 - 01/28 0700 In: 1200 [P.O.:1200] Out: 1450 [Urine:1450] Intake/Output this shift: Total I/O In: 130 [P.O.:120; I.V.:10] Out: -   Lab Results: Recent  Labs    12/30/24 0429 12/31/24 0354  WBC 8.5 8.1  HGB 7.5* 7.8*  HCT 22.5* 24.7*  PLT 241 262   BMET Recent Labs     12/30/24 0429 12/31/24 0353 01/01/25 0433  NA 133*  133* 136 133*  K 5.0  5.1 5.5* 5.1  CL 95*  95* 98 95*  CO2 32  31 32 31  GLUCOSE 78  77 98 164*  BUN 53*  53* 58* 65*  CREATININE 2.38*  2.40* 2.35* 2.52*  CALCIUM  8.6*  8.5* 9.0 8.8*   LFT Recent Labs    12/31/24 0353  ALBUMIN  3.2*   PT/INR No results for input(s): LABPROT, INR in the last 72 hours. Hepatitis Panel No results for input(s): HEPBSAG, HCVAB, HEPAIGM, HEPBIGM in the last 72 hours.  Studies/Results: No results found.  IMPRESSION/PLAN:  84 year old male with severe peripheral arterial disease who was initially admitted 12/11/2024 due to septic shock secondary to gangrenous left foot status post left amputation below the knee. Transferred to CIR 12/25/2024.  - Management per surgery and rehab service  - Charge nurse to contact wound care to assess perianal and gluteal area   Acute on chronic anemia. FOBT positive. No overt GI bleeding. Admission Hg 12.0. Post operatively, Hg levels continued to drift downward -> 9s -> 8s -> 7.8 on 1/25 -> 7.5 on 1/26 -> 7.8 on 1/27. Stools are dark green/brown/black per nursing staff. Rectal exam today showed soft dark green stool in the rectal vault.  Has not received any blood transfusions during this hospitalization/rehab.  - In the setting of coronary artery disease and severe peripheral arterial disease, I recommend transfusing for hemoglobin level less than 8 - CBC in a.m. - Await iron , TIBC, ferritin, vitamin B12 and folate level (for add-on labs today) - Defer endoscopic recommendations to Dr. Wilhelmenia   History of an acute retroperitoneal bleed while on heparin  during prior hospital admission 12/2023.  CTAP without contrast 12/25/2024 showed interval decrease in size of the right retroperitoneal collection seen on the previous study without evidence of recurrent retroperitoneal bleed.  Lower abdominal pain, intermittent.  CTAP without contrast 12/25/2024  did not identify any intra-abdominal/pelvic pathology to explain his abdominal pain. - Pain management per the medical service  Paroxysmal atrial fibrillation, on Eliquis  twice daily  CAD s/p  CABG 11/2020, NSTEMI 12/2023  Chronic dysphagia. Swallow study per speech pathology 12/19/2025 who assessed the patient had oropharyngeal dysphagia, likely exacerbated by mentation and at that juncture was not yet ready for p.o. diet.  He required tube feedings.  Modified barium swallow study 12/27/2024 which showed some esophageal retention and a dysphagia 3 diet was recommended. - Dysphagia 3 diet as tolerated - Continue Pantoprazole  40 mg p.o. twice daily - Defer endoscopic recommendations to Dr. Wilhelmenia  Nausea.  Oxycodone  may be a contributing factor. - Ondansetron  4 mg p.o. or IV every 6 hours as needed  History of colon polyps, his most recent colonoscopy 01/2013 identified 1 small tubular adenomatous polyp removed from the descending colon.  DM type II      [name]  01/01/2025, 3:50 PM      "

## 2025-01-01 NOTE — Progress Notes (Signed)
 "                                                        PROGRESS NOTE   Subjective/Complaints: Reports he did not sleep as well last night, reports this happens occasionally with him at home prior to admission.  Having occasional phantom pain.  Patient used CPAP last night and this was helpful.  Reports he had some nausea this morning, occurring intermittently.  Nausea improved with Zofran .  ROS: Patient denies fever, new vision changes, dizziness, nausea, vomiting, diarrhea,  shortness of breath or chest pain, headache, or mood change. + Nausea + Phantom pain + Insomnia  Objective:   No results found.  Recent Labs    12/30/24 0429 12/31/24 0354  WBC 8.5 8.1  HGB 7.5* 7.8*  HCT 22.5* 24.7*  PLT 241 262    Recent Labs    12/31/24 0353 01/01/25 0433  NA 136 133*  K 5.5* 5.1  CL 98 95*  CO2 32 31  GLUCOSE 98 164*  BUN 58* 65*  CREATININE 2.35* 2.52*  CALCIUM  9.0 8.8*    Intake/Output Summary (Last 24 hours) at 01/01/2025 0933 Last data filed at 01/01/2025 0857 Gross per 24 hour  Intake 1090 ml  Output 1450 ml  Net -360 ml        Physical Exam: Vital Signs Blood pressure (!) 107/54, pulse 60, temperature 98 F (36.7 C), temperature source Oral, resp. rate 18, height 5' (1.524 m), weight 100 kg, SpO2 100%.  Constitutional: No distress . Vital signs reviewed. HEENT: NCAT, EOMI, oral membranes moist Neck: supple Cardiovascular: RRR without murmur. No JVD    Respiratory/Chest: CTA Bilaterally without wheezes or rales. Normal effort. 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 Left BKA in shrinker with dry dressing. Leg is well formed with residual edema. Sl tender to palpation BKA incision Skin: bruising noted on Ues, warm and dry Neuro:   Alert and awake, follows simple commands, CN 2-12 grossly intact, moving all 4 extremities to  gravity.     Assessment/Plan: 1. Functional deficits which require 3+ hours per day of interdisciplinary therapy in a comprehensive inpatient rehab setting. Physiatrist is providing close team supervision and 24 hour management of active medical problems listed below. Physiatrist and rehab team continue to assess barriers to discharge/monitor patient progress toward functional and medical goals  Care Tool:  Bathing    Body parts bathed by patient: Right arm, Left arm, Chest, Abdomen, Face, Front perineal area, Right upper leg, Left upper leg, Right lower leg, Left lower leg   Body parts bathed by helper: Buttocks Body parts n/a: Left lower leg   Bathing assist Assist Level: Moderate Assistance - Patient 50 - 74%     Upper Body Dressing/Undressing Upper body dressing   What is the patient wearing?: Pull over shirt    Upper body assist Assist Level: Set up assist    Lower Body Dressing/Undressing Lower body dressing      What is the patient wearing?: Pants     Lower body assist Assist for lower body dressing: Contact Guard/Touching assist     Toileting Toileting    Toileting assist Assist for toileting: Maximal Assistance - Patient 25 -  49%     Transfers Chair/bed transfer  Transfers assist  Chair/bed transfer activity did not occur: Safety/medical concerns (weakness/fatigue/pain/nausea)  Chair/bed transfer assist level: Maximal Assistance - Patient 25 - 49%     Locomotion Ambulation   Ambulation assist   Ambulation activity did not occur: Safety/medical concerns (B BKA)          Walk 10 feet activity   Assist  Walk 10 feet activity did not occur: Safety/medical concerns (B BKA)        Walk 50 feet activity   Assist Walk 50 feet with 2 turns activity did not occur: Safety/medical concerns (B BKA)         Walk 150 feet activity   Assist Walk 150 feet activity did not occur: Safety/medical concerns (B BKA)         Walk 10 feet on  uneven surface  activity   Assist Walk 10 feet on uneven surfaces activity did not occur: Safety/medical concerns (B BKA)         Wheelchair     Assist Is the patient using a wheelchair?: Yes Type of Wheelchair: Manual Wheelchair activity did not occur: Safety/medical concerns (weakness/fatigue/pain/nausea)         Wheelchair 50 feet with 2 turns activity    Assist    Wheelchair 50 feet with 2 turns activity did not occur: Safety/medical concerns (weakness/fatigue/pain/nausea)       Wheelchair 150 feet activity     Assist  Wheelchair 150 feet activity did not occur: Safety/medical concerns (weakness/fatigue/pain/nausea)       Blood pressure (!) 107/54, pulse 60, temperature 98 F (36.7 C), temperature source Oral, resp. rate 18, height 5' (1.524 m), weight 100 kg, SpO2 100%.  Medical Problem List and Plan: 1. 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 Formulized individual treatment plan based on medical history, underlying problem and comorbidities.   2.  Antithrombotics: -DVT/anticoagulation:  Pharmaceutical: Eliquis              -antiplatelet therapy: N/A 3. Pain Management: Neurontin  100 mg nightly, oxycodone  as needed, Robaxin  as needed  - 1/24 continue as needed oxycodone  for pain control  -1/28 increase gabapentin  to 100 mg 3 times daily for phantom pain 4. Mood/Behavior/Sleep: Rozerem  8 mg nightly, trazodone  75 mg nightly as needed             -antipsychotic agents: N/A  -1/28 continue current dose trazodone  at night for  insomnia, could consider trying higher dose of insomnia continue 5. Neuropsych/cognition: This patient is not capable of making decisions on his own behalf. 6. Skin/Wound Care: continue dry dressing and shrinker to left BKA 7. Fluids/Electrolytes/Nutrition: Routine and analysis with follow-up chemistries 8.  History of right BKA 2022.  Patient received CIR.  Intermittent use of prosthesis 9.  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  CRRT 1/10 - 1/12 transition to intermittent hemodialysis.  Follow-up renal services  -1/22-23  reviewed nephrology note, monitoring for need  for dialysis  -1/26 labs appear relatively stable  -1/27 BUN and creatinine stable at 58/2.35, discussed with nephrology they plan to sign off.  If renal function does not completely recover he can be referred to outpatient f/u with [facility].  Dialysis catheter to be discontinued -order placed by nephrology .  -1/28 BUN and creatinine slightly higher at 65/2.51 continue to monitor 10.  Paroxysmal atrial fibrillation.  Continue Eliquis .    - 1/24 patient did have vomiting after Eliquis , discussed with pharmacy we will hold off on redosing  - Heart rate stable    01/01/2025    5:11 AM 01/01/2025    5:00 AM 12/31/2024    7:47 PM  Vitals with BMI  Weight  220 lbs 7 oz   BMI  43.06   Systolic 107  104  Diastolic 54  46  Pulse 60  70    11.  Acute on chronic anemia.  Follow-up CBC.  Continue iron  supplement  - 1/25 Hemoglobin lower at 7.8, recheck tomorrow   -1/26- hgb with further dip to 7.5, likely multifactorial, normal MCV   -will check one stool for OB  1/27 hemoglobin back up to 7.8, stool OB pending.  Will remind nurses to check with next bowel movement  -1/28 IB stool positive, will contact GI 12.  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  NovoLog  4 units every 4 hours, Lantus  insulin  18 units twice daily. -1/22 had mild hypoglycemia yesterday, decrease novolog  to 2 units  Q4h -1/23-27 stable continue current regimen monitor CBG (last 3)  Recent Labs    12/31/24 1627 12/31/24 2113 01/01/25 0627  GLUCAP 150* 178* 117*    13.  Hypotension.  ProAmatine  5 mg every 8 hours.  Monitor with increased mobility  - 1/23-28 BP soft but overall stable continue to monitor    01/01/2025    5:11 AM 01/01/2025    5:00 AM 12/31/2024    7:47 PM  Vitals with BMI  Weight  220 lbs 7 oz   BMI  43.06   Systolic 107  104  Diastolic 54  46  Pulse 60  70    14.  Chronic systolic congestive heart failure.  Echocardiogram with ejection fraction of 30 to 35%.  Monitor for any signs of fluid overload. Daily weights -1/23 no signs of overload noted, difficult to assess weight trend suspect inaccurate readings -1/24 does not appear to have signs of fluid overload, dialysis today per nephrology   Filed Weights   12/30/24 0409 12/31/24 0500 01/01/25 0500  Weight: 97.5 kg 98 kg 100 kg     15.  CAD/AV block/Mobitz 1/aortic stenosis/CABG 2021.  Follow-up cardiology services. 16.  Idiopathic pulmonary fibrosis/OSA.  Followed by Dr. Geronimo.  Patient with 2 L oxygen intermittently at home.  Currently not on CPAP until tube feeds discontinued  - 1/25 consider restarting CPAP if NG tube removed, breathing treatments helping   1/26 resume CPAP today  1/27 patient was ordered for CPAP, he forgot to start yesterday plans to use tonight 17.  Oropharyngeal dysphagia.  Follow-up speech therapy.  Currently with Cortrak feeding tube for nutritional support.  Advance diet as tolerated. Currently NPO with ice chips allowed  - 1/23 diet was upgraded today to D3 thin by SLP  -1/25 will hold tube feedings(pt was declining also), patient would like to try eating orally especially now that nausea is improved.  Monitor intake could consider removing tube tomorrow if doing well.   1/26  eating well enough. Dc TF. Pt aware that he needs to push po intake.   1/27 eating most of his meals, continue to  monitor 18.  Diarrhea.  Related to tube feeds.  Flexi-Seal in place: continue Continue Imodium .  -1/23 LBM 1/21, improved continue to follow-up  -1/25 LBM  --- improved overall  1/28 LBM today   19.  GERD: continue Protonix    20.  Hyperlipidemia: continue Lipitor   21. Itching.  -Continue atarax  PRN  -sarna PRN  22.  Hyperkalemia, hyponatremia -1/23 nephrology following has ordered Lokelma , appreciate assistance Improved -1/27 potassium elevated today, nephrology ordered Lokelma  daily, continue to monitor trend Diet adjusted to low K  23. Nausea  -hold tube feeds for now, nephrology planning dialysis today as this may be potential cause.  Abdominal x-ray ordered-no obstruction/ileus    -1/25-27 nausea has improved, continue current regimen  -1/28 continues to have intermittent nausea, will ask pharmacy to spread out medication   LOS: 7 days A FACE TO FACE EVALUATION WAS PERFORMED  Murray Collier 01/01/2025, 9:33 AM     "

## 2025-01-01 NOTE — Progress Notes (Signed)
 Occupational Therapy Session Note  Patient Details  Name: Shane [name] MRN: [id number] Date of Birth: 30-Sep-1941  Today's Date: 01/01/2025 OT Individual Time: 9299-9181 OT Individual Time Calculation (min): 78 min    Short Term Goals: Week 1:  OT Short Term Goal 1 (Week 1): Patient to perform bed level LE dressing with Mod assist OT Short Term Goal 2 (Week 1): Patient to perform [facility] transfer with Max of one OT Short Term Goal 3 (Week 1): Patient to perform bathing at bed level with Min assist  Skilled Therapeutic Interventions/Progress Updates:  Patient agreeable to participate in OT session. Reports 0/10 pain level, no leg pain or phantom pains .   Patient participated in skilled OT session focusing on bathing, dressing, activity tolerance, mobility. patient received in bed ready for OT. Patient able to complete UB bathing, LB bathing at bed level. Patient then able to complete UB dressing SU, LB dressing in bed with CGA. Patient CGA to min A to don limb protector. Patient reported decreased fatigue following self care ADLs, although increased time required. Patient able to sit EOB with SUP to CGA. Good dynamic balance sitting EOB. Patient required min A to don prosthesis with good fit noted. Patient then completed slide board transfer mod A to R side. Patient then educated on transfers at home, safety when completing transfers, and importance of OOB activities. Patient reported nausea, nursing notified for medication management, and OT provided  drink to assist. Patient left in chair alarm on all needs in reach.     Therapy Documentation Precautions:  Precautions Precautions: Fall Recall of Precautions/Restrictions: Intact Precaution/Restrictions Comments: watch BP wound VAC, LLE splint Restrictions Weight Bearing Restrictions Per Provider Order: Yes LLE Weight Bearing Per Provider Order: Non weight bearing  Therapy/Group: Individual Therapy  [name] Jamez Ambrocio 01/01/2025, 7:06  AM

## 2025-01-01 NOTE — Patient Care Conference (Signed)
 Inpatient RehabilitationTeam Conference and Plan of Care Update Date: 01/01/2025   Time: 09:32 AM   Patient was admitted after the interdisciplinary team meeting took place on 12/25/2024; as a result, the first team conference occurred on day 8 of this patient's stay.     Patient Name: Shane [name]      Medical Record Number: [id number]  Date of Birth: 12/25/1940 Sex: Male         Room/Bed: 4W05C/4W05C-01 Payor Info: Payor: [employer] MEDICARE / Plan: [facility] MEDICARE / Product Type: *No Product type* /    Admit Date/Time:  12/25/2024  6:34 PM  Primary Diagnosis:  Left below-knee amputee [facility])  Hospital Problems: Principal Problem:   Left below-knee amputee [facility])    Expected Discharge Date: Expected Discharge Date: 01/13/25  Team Members Present: Physician leading conference: Dr. Murray Collier Social Worker Present: Waverly Gentry, LCSW-A Nurse Present: Barnie Ronde, RN PT Present: Izetta Seip, PT OT Present: Michaelyn Seip, OT SLP Present: Rosina Downy, SLP PPS Coordinator present : Eleanor Colon, SLP     Current Status/Progress Goal Weekly Team Focus  Bowel/Bladder   Patient is continent with bladder and incontinent of bowel. LBM is 1/27.   Patient will remain continent of bladder and becomes continent of bowel.   Monitor bowel and bladder patterns to prevent constipation, diarrhea or urinary issues. Encourage toileting and assist with toileting needs.    Swallow/Nutrition/ Hydration   tolerating regular/thin liquids   at goal level       ADL's   max to total transfers, total toileting, mod LB, UB mod, poor dynamic sitting   min A, CGA   balance, transfers, increased mobility, LB dressing toileting, 2/9.    Mobility   Min A for bed mobility; max A for slide board transfers   Supervision to min A  Endurance/activity tolerance, bed mobility, transfers, pressure relief; HH PT; DME - slide board; DC 2/9    Communication                Safety/Cognition/  Behavioral Observations  supervision-modI for complex problem solving, patient reports he feels close to baseline but endorses some changes to processing speed   modI   complex problem solving, processing speed    Pain   Patient complains of left stump pain rated 7/10. Pt has PRN Oxycodone .   Patient will report pain score of 5 or less.   Assess pain every shift and as needed and provide appropriate pain management interventions.    Skin   Patient has generalized bruising, small skin tears on bilateral arms, redness to the bilateral buttocks and incision to left BKA. IJ catheter to right neck removed on 1/27, dressing in place. Pt has Gerhardt's cream.   Promote wound healing and prevent infection.  Ongoing skin assessment every shift and as needed and monitor for signs of infection and delayed healing. Prevent moisture-related skin issues by providing peri-care after each voiding or bowel movement, and perform frequent turning and repositioning.      Discharge Planning:  Home with wife. and grandson. Has a wound vac. Has most DME. Will await therapy follow-up recommendations.   Team Discussion: Patient admitted post left BKA with old right BKA,. Limited by poor core strength, fatigue, dizziness, nausea, and cognitive issues post anemia and confusion.  Patient on target to meet rehab goals: yes, currently needs mod assist for transfers (used a scooter PTA) and max assist for slide-board transfers. Needs assist for don/doff prosthetic on right and limb protector  for left. Needs supervision - mod I  for for complex problem solving and delayed process speed. Goals for discharge set for min assist overall.  *See Care Plan and progress notes for long and short-term goals.   Revisions to Treatment Plan:  Cortrak removed Wound vac discontinued Lokelma  for Hyperkalemia per MD Guaiac stool positive; GI consult   Teaching Needs: Safety,   Current Barriers to Discharge: Decreased caregiver  support and Home enviroment access/layout  Possible Resolutions to Barriers: Family education     Medical Summary Current Status: L aka, CKD, afib, DM2, hypotension, anemia, heart failure, nausea  Barriers to Discharge: Medical stability;Self-care education;Renal Insufficiency/Failure;Electrolyte abnormality  Barriers to Discharge Comments: L aka, CKD, afib, DM2, hypotension, anemia, heart failure, nausea, poss OB stool Possible Resolutions to [employer] Focus: monitor CBC/BMP, consider GI consult, work on transfering   Continued Need for Acute Rehabilitation Level of Care: The patient requires daily medical management by a physician with specialized training in physical medicine and rehabilitation for the following reasons: Direction of a multidisciplinary physical rehabilitation program to maximize functional independence : Yes Medical management of patient stability for increased activity during participation in an intensive rehabilitation regime.: Yes Analysis of laboratory values and/or radiology reports with any subsequent need for medication adjustment and/or medical intervention. : Yes   I attest that I was present, lead the team conference, and concur with the assessment and plan of the team.   Fredericka Sober B 01/01/2025, 1:58 PM

## 2025-01-01 NOTE — Progress Notes (Signed)
 Physical Therapy Session Note  Patient Details  Name: Shane [name] MRN: [id number] Date of Birth: Jun 21, 1941  Today's Date: 01/01/2025 PT Individual Time: 1300-1415 PT Individual Time Calculation (min): 75 min   Short Term Goals: Week 1:  PT Short Term Goal 1 (Week 1): Pt will perform supine to sit transfers with min A PT Short Term Goal 2 (Week 1): Pt will perform sit to supine transfers with min A PT Short Term Goal 3 (Week 1): Pt will maintain sitting balance EOB with CGA PT Short Term Goal 4 (Week 1): Pt will perform bed <> WC transfers with max A  Skilled Therapeutic Interventions/Progress Updates:     Pt semi-reclined in bed upon arrival. Pt reports ~5/10 pain in L residual limb. States he was dizzy earlier which has subsided somewhat since being back in bed. Agreeable to therapy. Session emphasized functional strengthening, cardiovascular and muscular endurance/activity tolerance, and balance with transfers. Checked for orthostatics - BP in supine 118/51 and BP in sitting 116/46. Pt donned R prosthesis with max A and L limb protector with total A for time management. Pt performed slide board transfer EOB to TIS with mod A. Transported dependent to ortho gym. Pt practiced x2 slide board transfers TIS <> simulated car with mod A overall, ++ time, and VC for hand placement and lifting LEs into/out of car. Pt required prolonged rest breaks in between each part of transfer due to fatigue and dizziness. Pt performed x10 seated WC push ups using B UE with R prosthesis resting on floor for improved stability. Pt significantly fatigued after push ups. Once back in room, pt performed slide board transfer back to EOB with heavy mod A due to fatigue. Pt performed R and L rolling in bed for pressure relief on buttocks as well as to assist with peri care. PT notified nursing directly that pt requested pain medication. Pt remained supine with all needs in reach at end of session.  Therapy  Documentation Precautions:  Precautions Precautions: Fall Recall of Precautions/Restrictions: Intact Precaution/Restrictions Comments: watch BP wound VAC, LLE splint Restrictions Weight Bearing Restrictions Per Provider Order: Yes LLE Weight Bearing Per Provider Order: Non weight bearing  Therapy/Group: Individual Therapy  Comer CHRISTELLA Levora Comer Levora, PT, DPT 01/01/2025, 8:05 AM

## 2025-01-01 NOTE — Progress Notes (Signed)
 Speech Language Pathology Daily Session Note  Patient Details  Name: Shane [name] MRN: [id number] Date of Birth: 1941/08/08  Today's Date: 01/01/2025 SLP Individual Time: 0830-0926 SLP Individual Time Calculation (min): 56 min  Short Term Goals: Week 1: SLP Short Term Goal 1 (Week 1): Pt will participate in MBSS to identify least restrictive diet SLP Short Term Goal 2 (Week 1): Pt will solve mildly complex problems w/ supervisionA SLP Short Term Goal 3 (Week 1): Pt will recall recent/relevant information w/ supervisionA  Skilled Therapeutic Interventions: SLP conducted skilled therapy session targeting cognitive goals. Patient indicates dizziness and nausea, alerted nursing to symptoms and patient request for anti-nausea medication. LPN administered medications during session, patient tolerated medications whole with thin liquids without difficulty. Patient completed complex home-based math problems involving calculations of square footage, room size, and differences between various rooms with supervision-modI given increased processing time. Patient then completed novel moderate complexity problem solving and deductive reasoning task with supervision-min assist. Patient was left in room with call bell in reach and alarm set. SLP will continue to target goals per plan of care.        Pain  None endorsed  Therapy/Group: Individual Therapy  Rosina Downy, M.A., CCC-SLP  Shane [name] 01/01/2025, 10:11 AM

## 2025-01-02 DIAGNOSIS — Z860100 Personal history of colon polyps, unspecified: Secondary | ICD-10-CM

## 2025-01-02 DIAGNOSIS — R21 Rash and other nonspecific skin eruption: Secondary | ICD-10-CM

## 2025-01-02 DIAGNOSIS — Z7901 Long term (current) use of anticoagulants: Secondary | ICD-10-CM

## 2025-01-02 LAB — GLUCOSE, CAPILLARY
Glucose-Capillary: 70 mg/dL (ref 70–99)
Glucose-Capillary: 125 mg/dL — ABNORMAL HIGH (ref 70–99)
Glucose-Capillary: 52 mg/dL — ABNORMAL LOW (ref 70–99)
Glucose-Capillary: 55 mg/dL — ABNORMAL LOW (ref 70–99)
Glucose-Capillary: 94 mg/dL (ref 70–99)
Glucose-Capillary: 144 mg/dL — ABNORMAL HIGH (ref 70–99)
Glucose-Capillary: 196 mg/dL — ABNORMAL HIGH (ref 70–99)

## 2025-01-02 LAB — CBC
HCT: 24.9 % — ABNORMAL LOW (ref 39.0–52.0)
Hemoglobin: 8.2 g/dL — ABNORMAL LOW (ref 13.0–17.0)
MCH: 30.3 pg (ref 26.0–34.0)
MCHC: 32.9 g/dL (ref 30.0–36.0)
MCV: 91.9 fL (ref 80.0–100.0)
Platelets: 250 10*3/uL (ref 150–400)
RBC: 2.71 MIL/uL — ABNORMAL LOW (ref 4.22–5.81)
RDW: 14.7 % (ref 11.5–15.5)
WBC: 6.6 10*3/uL (ref 4.0–10.5)
nRBC: 0 % (ref 0.0–0.2)

## 2025-01-02 LAB — BASIC METABOLIC PANEL WITH GFR
Anion gap: 8 (ref 5–15)
BUN: 56 mg/dL — ABNORMAL HIGH (ref 8–23)
CO2: 32 mmol/L (ref 22–32)
Calcium: 9.1 mg/dL (ref 8.9–10.3)
Chloride: 98 mmol/L (ref 98–111)
Creatinine, Ser: 2.22 mg/dL — ABNORMAL HIGH (ref 0.61–1.24)
GFR, Estimated: 29 mL/min — ABNORMAL LOW
Glucose, Bld: 74 mg/dL (ref 70–99)
Potassium: 4.9 mmol/L (ref 3.5–5.1)
Sodium: 138 mmol/L (ref 135–145)

## 2025-01-02 MED ORDER — GERHARDT'S BUTT CREAM
1.0000 | TOPICAL_CREAM | Freq: Three times a day (TID) | CUTANEOUS | Status: DC | PRN
  Administered 2025-01-05: 1 via TOPICAL
  Filled 2025-01-02 (×2): qty 60

## 2025-01-02 MED ORDER — INSULIN GLARGINE 100 UNIT/ML ~~LOC~~ SOLN
15.0000 [IU] | Freq: Two times a day (BID) | SUBCUTANEOUS | Status: DC
  Administered 2025-01-02 – 2025-01-07 (×10): 15 [IU] via SUBCUTANEOUS
  Filled 2025-01-02 (×11): qty 0.15

## 2025-01-02 MED ORDER — INSULIN ASPART 100 UNIT/ML IJ SOLN
0.0000 [IU] | Freq: Three times a day (TID) | INTRAMUSCULAR | Status: DC
  Administered 2025-01-03: 3 [IU] via SUBCUTANEOUS
  Administered 2025-01-03 – 2025-01-08 (×8): 1 [IU] via SUBCUTANEOUS
  Administered 2025-01-09: 2 [IU] via SUBCUTANEOUS
  Filled 2025-01-02: qty 3
  Filled 2025-01-02: qty 2
  Filled 2025-01-02 (×6): qty 1

## 2025-01-02 NOTE — Progress Notes (Signed)
 Hypoglycemic Event  CBG: 52 (time: 1617)  Treatment: 8 oz juice/soda  Symptoms: None  Follow-up CBG: Time: 1645 CBG Result: 55 Waited & checked again: time: 1710 CBG result: 94  Possible Reasons for Event: Inadequate meal intake  Comments/MD notified: Provider was notified. Meds were adjusted. Will be rechecked an hour after evening meal per provider.    Tommi Crepeau LOISE Proud

## 2025-01-02 NOTE — Progress Notes (Signed)
 Occupational Therapy Session Note  Patient Details  Name: Shane [name] MRN: [id number] Date of Birth: 1941/05/29  Today's Date: 01/03/2025 OT Individual Time: 1320-1443 OT Individual Time Calculation (min): 83 min    Short Term Goals: Week 1:  OT Short Term Goal 1 (Week 1): Patient to perform bed level LE dressing with Mod assist OT Short Term Goal 2 (Week 1): Patient to perform [facility] transfer with Max of one OT Short Term Goal 3 (Week 1): Patient to perform bathing at bed level with Min assist  Skilled Therapeutic Interventions/Progress Updates:  Pt greeted sitting in TIS [facility] for skilled OT session with focus on functional transfers, toileting, and residual limb care. Pt received/maintained on 3L O2 [place].   Pain: Pt with no reports of pain. OT offering intermediate rest breaks and positioning suggestions throughout session to address pain/fatigue and maximize participation/safety in session.   Functional Transfers: Time dedicated to locating bariatric drop-arm BSC for increased safety with toileting transfers. Pt performs slide-board transfer form TIS<> bariatric drop arm BSC & TIS WC>EOB with CGA-Min A (for board placement).   Self Care Tasks: 3/3 toileting tasks completed at Max A level, but patient able to perform series of WC pushups to assist with tasks. Due to fatigue, OT provides up to Mod A to maintain semi-squatted position as +2 assistance manages garments. Continent BM void.   Education: Pt educated on need to change shinker daily for skin integrity and promotion of wound healing. Extra shinker retrieved, current shinker washed by hand and hung to dry. MD made aware of need for extra shinkers.   Pt remained resting in bed WC with 4Ps assessed and immediate needs met. Pt continues to be appropriate for skilled OT intervention to promote further functional independence in ADLs/IADLs.   Therapy Documentation Precautions:  Precautions Precautions: Fall Recall of  Precautions/Restrictions: Intact Precaution/Restrictions Comments: watch BP wound VAC, LLE splint Restrictions Weight Bearing Restrictions Per Provider Order: Yes LLE Weight Bearing Per Provider Order: Non weight bearing   Therapy/Group: Individual Therapy  Nereida Habermann, OTR/L, MSOT  01/03/2025, 1:16 PM

## 2025-01-02 NOTE — Progress Notes (Signed)
 Occupational Therapy Session Note  Patient Details  Name: Shane [name] MRN: [id number] Date of Birth: 1941/02/23  Today's Date: 01/02/2025 OT Individual Time: 9184-9069 OT Individual Time Calculation (min): 75 min    Short Term Goals: Week 1:  OT Short Term Goal 1 (Week 1): Patient to perform bed level LE dressing with Mod assist OT Short Term Goal 2 (Week 1): Patient to perform [facility] transfer with Max of one OT Short Term Goal 3 (Week 1): Patient to perform bathing at bed level with Min assist  Skilled Therapeutic Interventions/Progress Updates:    Skilled OT intervention with focus on LB dressing, donning limb guard, bed mobility, sitting balance, TB transfers, BUE therex for general conditioning, and activity tolerance to increase independence with bADLs. Pt donned pants at bed level with min A. Pt required assistance with donning limb guard. Supine>sit EOB with min A. Sitting balance with supervision. TB transfer to w/c with CGA after TB placement. Pt SOB. O2 sats 100% 4L O2. UBE 2x5 mins level 5 with rest breaks. Pt SOB requiring extended rest break. Pt returned to room and remained in w/c with all needs within reach. Continued education and discharge planning.  Therapy Documentation Precautions:  Precautions Precautions: Fall Recall of Precautions/Restrictions: Intact Precaution/Restrictions Comments: watch BP wound VAC, LLE splint Restrictions Weight Bearing Restrictions Per Provider Order: Yes LLE Weight Bearing Per Provider Order: Non weight bearing Pain: Pt with 7/10 phantom pain in LLE; meds admin prior to therapy   Therapy/Group: Individual Therapy  Maritza Debby Mare 01/02/2025, 12:14 PM

## 2025-01-02 NOTE — Progress Notes (Signed)
 Speech Language Pathology Discharge Summary  Patient Details  Name: Shane [name] MRN: [id number] Date of Birth: [birth year]  Date of Discharge from SLP service:January 02, 2025  Today's Date: 01/02/2025 SLP Individual Time: 8554-8472 SLP Individual Time Calculation (min): 42 min   Skilled Therapeutic Interventions:   Skilled therapy session focused on cognitive goals. SLP facilitated session by prompting patient to share information re: self, family and job. Patient recalled in depth information regarding his family, business and medical hx. Patient independently sequenced medical events since first amputation and reports feeling great about current progress in CIR. Patient reports initially noting some cognitive changes at admission, though cognition has now returned to baseline. Patient no longer requires ST services as cognition is at baseline and he is tolerating D3/thin diet per MBS. Patient left in bed with alarm set and call bell in reach. Continue POC.      Patient has met 3 of 3 long term goals.  Patient to discharge at overall Modified Independent level.  Reasons goals not met: n/a   Clinical Impression/Discharge Summary: Pt has made great gains and has met 3 of 3 LTG's this admission due to improved swallowing and cognition. Pt is currently an overall mod i for cognitive tasks and requires mod i cues for utilization of swallowing compensatory strategies to minimize overt s/sx of aspiration with D3/thin diet. Pt/family education complete and pt will discharge home with supervision from friends/family/etc. No f/u ST required   Care Partner:  Caregiver Able to Provide Assistance: Yes     Recommendation:  None      Equipment: n/a   Reasons for discharge: Treatment goals met   Patient/Family Agrees with Progress Made and Goals Achieved: Yes    Ashtian Villacis M.A., CCC-SLP 01/02/2025, 3:30 PM

## 2025-01-02 NOTE — Progress Notes (Incomplete Revision)
 "    [facility] Progress Note  CC:  Anemia   Subjective: He just finished physical therapy.  He denies having any nausea or vomiting.  No abdominal pain.  No BM today.  No chest pain or shortness of breath.  He is sitting up in the wheelchair at this time.   Objective:  Vital signs in last 24 hours: Temp:  [97.5 F (36.4 C)-97.8 F (36.6 C)] 97.5 F (36.4 C) (01/29 0548) Pulse Rate:  [61-88] 62 (01/29 0548) Resp:  [17-18] 17 (01/29 0548) BP: (94-130)/(57-61) 120/60 (01/29 0548) SpO2:  [99 %-100 %] 99 % (01/29 0548) Weight:  [99 kg-99.3 kg] 99 kg (01/29 0500) Last BM Date : 01/01/25 General: Alert 84 year old male sitting up in wheelchair no acute distress. Heart: Regular rate and rhythm, no murmurs. Pulm: Breath sounds clear.  On oxygen 4 L nasal cannula. Abdomen: Abdomen is mildly distended when examined sitting up in the wheelchair.  Soft.  Nontender.  Positive bowel sounds to all 4 quadrants. Extremities: Bilateral BKA, left BKA dressing dry and intact.  Neurologic:  Alert and oriented x 4.  Speech is clear. Psych:  Alert and cooperative. Normal mood and affect.  Intake/Output from previous day: 01/28 0701 - 01/29 0700 In: 727 [P.O.:717; I.V.:10] Out: 200 [Urine:200] Intake/Output this shift: Total I/O In: 10 [I.V.:10] Out: 250 [Urine:250]  Lab Results: Recent Labs    12/31/24 0354 01/01/25 1559 01/02/25 0604  WBC 8.1 7.6 6.6  HGB 7.8* 8.2* 8.2*  HCT 24.7* 25.4* 24.9*  PLT 262 254 250   BMET Recent Labs    12/31/24 0353 01/01/25 0433 01/02/25 0604  NA 136 133* 138  K 5.5* 5.1 4.9  CL 98 95* 98  CO2 32 31 32  GLUCOSE 98 164* 74  BUN 58* 65* 56*  CREATININE 2.35* 2.52* 2.22*  CALCIUM  9.0 8.8* 9.1   LFT Recent Labs    12/31/24 0353  ALBUMIN  3.2*   PT/INR No results for input(s): LABPROT, INR in the last 72 hours. Hepatitis Panel No results for input(s): HEPBSAG, HCVAB, HEPAIGM, HEPBIGM in the last 72 hours.  No  results found.  Patient Profile: Shane [name] is a 84 y.o. male with a past medical history of CAD s/p 3 vessel CABG 11/2020, NSTEMI 12/2023, AS s/p TAVR, sick sinus syndrome s/p pacemaker placment, paroxysmal atrial fibrillation, idiopathic pulmonary fibrosis, OSA on CPAP and oxygen 2 L nasal cannula at bedtime, DM type II, CKD stage III, PVD s/p right BKA, anemia of chronic disease, right retroperitoneal bleed 12/2023 and colon polyps. Admitted 12/11/2024 for ischemic lower extremity ulcers, left foot with gangrene and septic shock status post left amputation below the knee. Postoperative course complicated by hypotension, transferred to the ICU.  He developed AKI and underwent CRRT.  His renal and clinical status stabilized and he was transferred to the medical floor and eventually discharged to [facility] in house rehab 12/25/2024.   Assessment / Plan:  84 year old male with severe peripheral arterial disease who was initially admitted 12/11/2024 due to septic shock secondary to gangrenous left foot status post left amputation below the knee. Transferred to CIR 12/25/2024.  - Management per surgery and rehab service    Acute on chronic anemia. FOBT positive. No overt GI bleeding. Admission Hg 12.0. Post operatively, Hg levels continued to drift downward -> 9s -> 8s -> 7.8 on 1/25 -> 7.5 on 1/26 -> 7.8 on 1/27 -> 8.2  on 1/28 -> today Hg 8.2.  Iron  52.  TIBC 286.  Ferritin 188.  Vitamin B12 level 1173.  Folate > 20. Stools are dark green/brown/black per nursing staff. Rectal exam 1/28 showed soft dark green stool in the rectal vault.  Has not received any blood transfusions during this hospitalization/rehab.  Patient does not wish to pursue any endoscopic evaluation at this time. He will consider possible outpatient EGD  +/- colonoscopy in one month. - Resume dysphagia 3 diet - In the setting of coronary artery disease and severe peripheral arterial disease, I recommend transfusing for hemoglobin level less than  8 - CBC in a.m. - Await further recommendations per Dr. Wilhelmenia   History of an acute retroperitoneal bleed while on heparin  during prior hospital admission 12/2023.  CTAP without contrast 12/25/2024 showed interval decrease in size of the right retroperitoneal collection seen on the previous study without evidence of recurrent retroperitoneal bleed.   Lower abdominal pain, intermittent.  CTAP without contrast 12/25/2024 did not identify any intra-abdominal/pelvic pathology to explain his abdominal pain.  No abdominal pain today. - Pain management per the medical service   Paroxysmal atrial fibrillation, on Eliquis  twice daily   CAD s/p  CABG 11/2020, NSTEMI 12/2023   Chronic dysphagia. Swallow study per speech pathology 12/19/2025 who assessed the patient had oropharyngeal dysphagia, likely exacerbated by mentation and at that juncture was not yet ready for p.o. diet.  He required tube feedings.  Modified barium swallow study 12/27/2024 which showed some esophageal retention and a dysphagia 3 diet was recommended. - Dysphagia 3 diet as tolerated - Continue Pantoprazole  40 mg p.o. twice daily   Nausea.  Oxycodone  may be a contributing factor. - Ondansetron  4 mg p.o. or IV every 6 hours as needed   History of colon polyps, his most recent colonoscopy 01/2013 identified 1 small tubular adenomatous polyp removed from the descending colon.   DM type II         Principal Problem:   Left below-knee amputee [facility]) Active Problems:   History of colonic polyps   Chronic anticoagulation     LOS: 8 days   Shane [name]  01/02/2025, 12:36 PM    Attending Physician's Attestation   I have taken an interval history, reviewed the chart and examined the patient.   ***  I agree with the Advanced Practitioner's note, impression, and recommendations with updates and my documentation as noted above.  The majority of the medical decision making/process, formulation of the  impression/plan of action for the patient were performed by me with substantive portion of this encounter (>50% time spent including complete performance of at least one of the key components of MDM, History, and/or Exam).   Aloha Wilhelmenia, MD [facility] Office # [phone]  "

## 2025-01-02 NOTE — Progress Notes (Addendum)
 "                                                        PROGRESS NOTE   Subjective/Complaints:  Patient reports he feels well overall.  Reports he slept well last night.  Pain 2-3 out of 10.  His nausea has improved.  He is having some pain on his buttocks, was noted to have a fungal rash started on nystatin . He would like PRN cream to use.   ROS: Patient denies fever, new vision changes, dizziness, nausea, vomiting, diarrhea,  shortness of breath or chest pain, headache, or mood change. + Nausea + Phantom pain + Insomnia + buttocks   Objective:   No results found.  Recent Labs    01/01/25 1559 01/02/25 0604  WBC 7.6 6.6  HGB 8.2* 8.2*  HCT 25.4* 24.9*  PLT 254 250    Recent Labs    01/01/25 0433 01/02/25 0604  NA 133* 138  K 5.1 4.9  CL 95* 98  CO2 31 32  GLUCOSE 164* 74  BUN 65* 56*  CREATININE 2.52* 2.22*  CALCIUM  8.8* 9.1    Intake/Output Summary (Last 24 hours) at 01/02/2025 1111 Last data filed at 01/02/2025 9061 Gross per 24 hour  Intake 607 ml  Output 450 ml  Net 157 ml        Physical Exam: Vital Signs Blood pressure 120/60, pulse 62, temperature (!) 97.5 F (36.4 C), temperature source Oral, resp. rate 17, height 5' (1.524 m), weight 99 kg, SpO2 99%.  Constitutional: No distress . Vital signs reviewed. HEENT: NCAT, EOMI, oral membranes moist Neck: supple Cardiovascular: RRR without murmur. No JVD    Respiratory/Chest: CTA Bilaterally without wheezes or rales. Normal effort. 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 Left BKA in shrinker with dry dressing. Leg is well formed with residual edema. Sl tender to palpation BKA incision Skin: bruising noted on Ues, warm and dry Neuro:   Alert and awake, follows simple commands, CN 2-12 grossly intact, moving all 4 extremities to gravity.     Assessment/Plan: 1.  Functional deficits which require 3+ hours per day of interdisciplinary therapy in a comprehensive inpatient rehab setting. Physiatrist is providing close team supervision and 24 hour management of active medical problems listed below. Physiatrist and rehab team continue to assess barriers to discharge/monitor patient progress toward functional and medical goals  Care Tool:  Bathing    Body parts bathed by patient: Right arm, Left arm, Chest, Abdomen, Face, Front perineal area, Right upper leg, Left upper leg, Right lower leg, Left lower leg   Body parts bathed by helper: Buttocks Body parts n/a: Left lower leg   Bathing assist Assist Level: Moderate Assistance - Patient 50 - 74%     Upper Body Dressing/Undressing Upper body dressing   What is the patient wearing?: Pull over shirt    Upper body assist Assist Level: Set up assist    Lower Body Dressing/Undressing Lower body dressing      What is the patient wearing?: Pants     Lower body assist Assist for lower body dressing: Contact Guard/Touching assist     Toileting Toileting    Toileting assist Assist for toileting:  Maximal Assistance - Patient 25 - 49%     Transfers Chair/bed transfer  Transfers assist  Chair/bed transfer activity did not occur: Safety/medical concerns (weakness/fatigue/pain/nausea)  Chair/bed transfer assist level: Moderate Assistance - Patient 50 - 74%     Locomotion Ambulation   Ambulation assist   Ambulation activity did not occur: Safety/medical concerns (B BKA)          Walk 10 feet activity   Assist  Walk 10 feet activity did not occur: Safety/medical concerns (B BKA)        Walk 50 feet activity   Assist Walk 50 feet with 2 turns activity did not occur: Safety/medical concerns (B BKA)         Walk 150 feet activity   Assist Walk 150 feet activity did not occur: Safety/medical concerns (B BKA)         Walk 10 feet on uneven surface  activity   Assist  Walk 10 feet on uneven surfaces activity did not occur: Safety/medical concerns (B BKA)         Wheelchair     Assist Is the patient using a wheelchair?: Yes Type of Wheelchair: Manual Wheelchair activity did not occur: Safety/medical concerns (weakness/fatigue/pain/nausea)         Wheelchair 50 feet with 2 turns activity    Assist    Wheelchair 50 feet with 2 turns activity did not occur: Safety/medical concerns (weakness/fatigue/pain/nausea)       Wheelchair 150 feet activity     Assist  Wheelchair 150 feet activity did not occur: Safety/medical concerns (weakness/fatigue/pain/nausea)       Blood pressure 120/60, pulse 62, temperature (!) 97.5 F (36.4 C), temperature source Oral, resp. rate 17, height 5' (1.524 m), weight 99 kg, SpO2 99%.  Medical Problem List and Plan: 1. 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 Formulized individual treatment plan based on medical history, underlying problem and comorbidities.   2.  Antithrombotics: -DVT/anticoagulation:  Pharmaceutical: Eliquis              -antiplatelet therapy: N/A 3. Pain Management: Neurontin  100 mg nightly, oxycodone  as needed, Robaxin  as needed  - 1/24 continue as needed oxycodone  for pain control  -1/28 increase gabapentin  to 100 mg 3 times daily for phantom pain 4. Mood/Behavior/Sleep: Rozerem  8 mg nightly, trazodone  75 mg nightly as needed             -antipsychotic agents: N/A  -1/28 continue current dose trazodone  at night for insomnia, could consider trying higher dose  of insomnia continue 5. Neuropsych/cognition: This patient is not capable of making decisions on his own behalf. 6. Skin/Wound Care: continue dry dressing and shrinker to left BKA 7. Fluids/Electrolytes/Nutrition: Routine and analysis with follow-up chemistries 8.  History of right BKA 2022.  Patient received CIR.  Intermittent use of prosthesis 9.  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  CRRT 1/10 - 1/12 transition to intermittent hemodialysis.  Follow-up renal services  -1/22-23  reviewed nephrology note, monitoring for need for dialysis  -1/26 labs appear relatively stable  -1/27 BUN and creatinine stable at 58/2.35, discussed with nephrology they plan to sign off.  If renal function does not completely recover he can be referred to outpatient f/u with [facility].  Dialysis catheter to be discontinued -order placed by nephrology .  -1/28 BUN and creatinine slightly higher at 65/2.51 continue to monitor 10.  Paroxysmal atrial fibrillation.  Continue Eliquis .    - 1/24 patient did have vomiting after Eliquis , discussed with pharmacy we will hold off on redosing  - Heart rate stable    01/02/2025    5:48 AM 01/02/2025    5:00 AM 01/01/2025    9:29 PM  Vitals with BMI  Weight  218 lbs 4 oz   BMI  42.63   Systolic 120    Diastolic 60    Pulse 62  87    11.  Acute on chronic anemia.  Follow-up CBC.  Continue iron  supplement  - 1/25 Hemoglobin lower at 7.8, recheck tomorrow   -1/26- hgb with further dip to 7.5, likely multifactorial, normal MCV   -will check one stool for OB  1/27 hemoglobin back up to 7.8, stool OB pending.  Will remind nurses to check with next bowel movement  -1/28 IB stool positive, will contact GI- GI consulted  - 1/20 9 GI following appreciate assistance, hemoglobin today 8.2.  GI following, recommends transfusing for level less than 8 of hemoglobin 12.  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  NovoLog  4 units every 4 hours, Lantus   insulin  18 units twice daily. -1/22 had mild hypoglycemia yesterday, decrease novolog  to 2 units Q4h -1/28 hypoglycemia, will decrease insulin  scale, also decrease lantus  to 15u BID CBG (last 3)  Recent Labs    01/01/25 1629 01/01/25 2040 01/02/25 0610  GLUCAP 134* 183* 70    13.  Hypotension.  ProAmatine  5 mg every 8 hours.  Monitor with increased mobility  - 1/23-29 BP soft but overall stable continue to monitor    01/02/2025    5:48 AM 01/02/2025    5:00 AM 01/01/2025    9:29 PM  Vitals with BMI  Weight  218 lbs 4 oz   BMI  42.63   Systolic 120    Diastolic 60    Pulse 62  87    14.  Chronic systolic congestive heart failure.  Echocardiogram with ejection fraction of 30 to 35%.  Monitor for any signs of fluid overload. Daily weights -1/23 no signs of overload noted, difficult to assess weight trend suspect inaccurate readings -1/24 does not appear to have signs of fluid overload, dialysis today per nephrology   Filed Weights   01/01/25 0500 01/01/25 1912 01/02/25 0500  Weight: 100 kg 99.3 kg 99 kg     15.  CAD/AV block/Mobitz 1/aortic stenosis/CABG 2021.  Follow-up cardiology services. 16.  Idiopathic pulmonary fibrosis/OSA.  Followed by Dr. Geronimo.  Patient with 2 L oxygen intermittently at home.  Currently not on CPAP until tube feeds discontinued  - 1/25 consider restarting CPAP if NG tube removed, breathing treatments helping   1/26 resume CPAP today  1/27 patient was ordered for CPAP, he forgot to start yesterday plans to use tonight 17.  Oropharyngeal dysphagia.  Follow-up speech therapy.  Currently with Cortrak feeding tube for nutritional support.  Advance diet as tolerated. Currently NPO with ice chips allowed  - 1/23 diet was upgraded today to D3 thin by  SLP  -1/25 will hold tube feedings(pt was declining also), patient would like to try eating orally especially now that nausea is improved.  Monitor intake could consider removing tube tomorrow if doing  well.   1/26 eating well enough. Dc TF. Pt aware that he needs to push po intake.   1/27 eating most of his meals, continue to monitor 18.  Diarrhea.  Related to tube feeds.  Flexi-Seal in place: continue Continue Imodium .  -1/23 LBM 1/21, improved continue to follow-up  -1/25 LBM  --- improved overall  1/29 LBM yesterday   19.  GERD: continue Protonix    20.  Hyperlipidemia: continue Lipitor   21. Itching.  -Continue atarax  PRN  -sarna PRN  22.  Hyperkalemia, hyponatremia -1/23 nephrology following has ordered Lokelma , appreciate assistance Improved -1/27 potassium elevated today, nephrology ordered Lokelma  daily, continue to monitor trend Diet adjusted to low K 1/29 potassium stable at 4.9  23. Nausea  -hold tube feeds for now, nephrology planning dialysis today as this may be potential cause.  Abdominal x-ray ordered-no obstruction/ileus    -1/25-27 nausea has improved, continue current regimen  -1/28 continues to have intermittent nausea, will ask pharmacy to spread out medication   -1/29 improved  24.  Buttocks fungal rash.  -Nystatin  powder, Gerhart's as needed.  Consider  adding oral fluconazole (discussed with cardiology yesterday that should be okay if needed.)  LOS: 8 days A FACE TO FACE EVALUATION WAS PERFORMED  Murray Collier 01/02/2025, 11:11 AM     "

## 2025-01-02 NOTE — Progress Notes (Signed)
 Physical Therapy Session Note  Patient Details  Name: Shane [name] MRN: [id number] Date of Birth: 01-Jun-1941  Today's Date: 01/02/2025 PT Individual Time: 1053-1205 PT Individual Time Calculation (min): 72 min   Short Term Goals: Week 1:  PT Short Term Goal 1 (Week 1): Pt will perform supine to sit transfers with min A PT Short Term Goal 2 (Week 1): Pt will perform sit to supine transfers with min A PT Short Term Goal 3 (Week 1): Pt will maintain sitting balance EOB with CGA PT Short Term Goal 4 (Week 1): Pt will perform bed <> WC transfers with max A  Skilled Therapeutic Interventions/Progress Updates: Pt presented in TIS agreeable to therapy. Pt states some pain in residual limb, nsg notified and pain meds received during session. Pt noted to have scooter present. Pt agreeable to try transfers to scooter this session. Pt transported to day room and set up for Slide board transfer. Pt required total A for Slide board set up however was able to complete transfer with minA to mat. Discussed set up of scooter at home and aligned scooter to simulate bedroom. Pt was able to complete lateral scoot to scooter with minA. Pt then participated UE therex for general conditioning including biceps curls with 4lb dowel 2 x 15, chest press with 3lb dowel 2 x 15, and shoulder flexion with 3lb dowel 2 x 15. Pt then completed lateral scoot transfer to L back to mat with minA and verbal cues for head hips relationship. Pt required minA for Slide board set up and completed Slide board transfer to L back into TIS again requiring minA and increased time. Pt transported back to room at end of session and remained in TIS with call bell within reach and needs met.       Therapy Documentation Precautions:  Precautions Precautions: Fall Recall of Precautions/Restrictions: Intact Precaution/Restrictions Comments: watch BP wound VAC, LLE splint Restrictions Weight Bearing Restrictions Per Provider Order: Yes LLE  Weight Bearing Per Provider Order: Non weight bearing  Therapy/Group: Individual Therapy  Ezra Marquess 01/02/2025, 12:26 PM

## 2025-01-02 NOTE — Progress Notes (Addendum)
 "    [facility] Progress Note  CC:  Anemia   Subjective: He just finished physical therapy.  He denies having any nausea or vomiting.  No abdominal pain.  No BM today.  No chest pain or shortness of breath.  He is sitting up in the wheelchair at this time.   Objective:  Vital signs in last 24 hours: Temp:  [97.5 F (36.4 C)-97.8 F (36.6 C)] 97.5 F (36.4 C) (01/29 0548) Pulse Rate:  [61-88] 62 (01/29 0548) Resp:  [17-18] 17 (01/29 0548) BP: (94-130)/(57-61) 120/60 (01/29 0548) SpO2:  [99 %-100 %] 99 % (01/29 0548) Weight:  [99 kg-99.3 kg] 99 kg (01/29 0500) Last BM Date : 01/01/25 General: Alert 84 year old male sitting up in wheelchair no acute distress. Heart: Regular rate and rhythm, no murmurs. Pulm: Breath sounds clear.  On oxygen 4 L nasal cannula. Abdomen: Abdomen is mildly distended when examined sitting up in the wheelchair.  Soft.  Nontender.  Positive bowel sounds to all 4 quadrants. Extremities: Bilateral BKA, left BKA dressing dry and intact.  Neurologic:  Alert and oriented x 4.  Speech is clear. Psych:  Alert and cooperative. Normal mood and affect.  Intake/Output from previous day: 01/28 0701 - 01/29 0700 In: 727 [P.O.:717; I.V.:10] Out: 200 [Urine:200] Intake/Output this shift: Total I/O In: 10 [I.V.:10] Out: 250 [Urine:250]  Lab Results: Recent Labs    12/31/24 0354 01/01/25 1559 01/02/25 0604  WBC 8.1 7.6 6.6  HGB 7.8* 8.2* 8.2*  HCT 24.7* 25.4* 24.9*  PLT 262 254 250   BMET Recent Labs    12/31/24 0353 01/01/25 0433 01/02/25 0604  NA 136 133* 138  K 5.5* 5.1 4.9  CL 98 95* 98  CO2 32 31 32  GLUCOSE 98 164* 74  BUN 58* 65* 56*  CREATININE 2.35* 2.52* 2.22*  CALCIUM  9.0 8.8* 9.1   LFT Recent Labs    12/31/24 0353  ALBUMIN  3.2*   PT/INR No results for input(s): LABPROT, INR in the last 72 hours. Hepatitis Panel No results for input(s): HEPBSAG, HCVAB, HEPAIGM, HEPBIGM in the last 72 hours.  No  results found.  Patient Profile: Shane [name] is a 84 y.o. male with a past medical history of CAD s/p 3 vessel CABG 11/2020, NSTEMI 12/2023, AS s/p TAVR, sick sinus syndrome s/p pacemaker placment, paroxysmal atrial fibrillation, idiopathic pulmonary fibrosis, OSA on CPAP and oxygen 2 L nasal cannula at bedtime, DM type II, CKD stage III, PVD s/p right BKA, anemia of chronic disease, right retroperitoneal bleed 12/2023 and colon polyps. Admitted 12/11/2024 for ischemic lower extremity ulcers, left foot with gangrene and septic shock status post left amputation below the knee. Postoperative course complicated by hypotension, transferred to the ICU.  He developed AKI and underwent CRRT.  His renal and clinical status stabilized and he was transferred to the medical floor and eventually discharged to [facility] in house rehab 12/25/2024.   Assessment / Plan:  84 year old male with severe peripheral arterial disease who was initially admitted 12/11/2024 due to septic shock secondary to gangrenous left foot status post left amputation below the knee. Transferred to CIR 12/25/2024.  - Management per surgery and rehab service    Acute on chronic anemia. FOBT positive. No overt GI bleeding. Admission Hg 12.0. Post operatively, Hg levels continued to drift downward -> 9s -> 8s -> 7.8 on 1/25 -> 7.5 on 1/26 -> 7.8 on 1/27 -> 8.2  on 1/28 -> today Hg 8.2.  Iron  52.  TIBC 286.  Ferritin 188.  Vitamin B12 level 1173.  Folate > 20. Stools are dark green/brown/black per nursing staff. Rectal exam 1/28 showed soft dark green stool in the rectal vault.  Has not received any blood transfusions during this hospitalization/rehab.  Patient does not wish to pursue any endoscopic evaluation at this time. He will consider possible outpatient EGD  +/- colonoscopy in one month. - Resume dysphagia 3 diet - In the setting of coronary artery disease and severe peripheral arterial disease, I recommend transfusing for hemoglobin level less than  8 - CBC in a.m. - Await further recommendations per Shane [name]   History of an acute retroperitoneal bleed while on heparin  during prior hospital admission 12/2023.  CTAP without contrast 12/25/2024 showed interval decrease in size of the right retroperitoneal collection seen on the previous study without evidence of recurrent retroperitoneal bleed.   Lower abdominal pain, intermittent.  CTAP without contrast 12/25/2024 did not identify any intra-abdominal/pelvic pathology to explain his abdominal pain.  No abdominal pain today. - Pain management per the medical service   Paroxysmal atrial fibrillation, on Eliquis  twice daily   CAD s/p  CABG 11/2020, NSTEMI 12/2023   Chronic dysphagia. Swallow study per speech pathology 12/19/2025 who assessed the patient had oropharyngeal dysphagia, likely exacerbated by mentation and at that juncture was not yet ready for p.o. diet.  He required tube feedings.  Modified barium swallow study 12/27/2024 which showed some esophageal retention and a dysphagia 3 diet was recommended. - Dysphagia 3 diet as tolerated - Continue Pantoprazole  40 mg p.o. twice daily   Nausea.  Oxycodone  may be a contributing factor. - Ondansetron  4 mg p.o. or IV every 6 hours as needed   History of colon polyps, his most recent colonoscopy 01/2013 identified 1 small tubular adenomatous polyp removed from the descending colon.   DM type II         Principal Problem:   Left below-knee amputee [facility]) Active Problems:   History of colonic polyps   Chronic anticoagulation     LOS: 8 days   [name]  01/02/2025, 12:36 PM    Attending Physician's Attestation   I have taken an interval history, reviewed the chart and examined the patient.   Patient examined and case discussed and reviewed.  Interesting that he did not have any evidence of iron  deficiency.  At this point patient also hesitant about any procedures without overt iron  deficiency.  Thus he declines  any upper or lower endoscopy.  We can reconsider endoscopic evaluation if something changes in patient's mindset and/or patient becomes transfusion dependent during this hospitalization without other clear etiology.  If he wants to see us  in follow-up after discharge, his primary care provider can reach out to us .  We will sign off at this time.  I agree with the Advanced Practitioner's note, impression, and recommendations with updates and my documentation as noted above.  The majority of the medical decision making/process, formulation of the impression/plan of action for the patient were performed by me with substantive portion of this encounter (>50% time spent including complete performance of at least one of the key components of MDM, History, and/or Exam).   Aloha Wilhelmenia, MD [facility]oscopy Office # [phone]  "

## 2025-01-02 NOTE — Plan of Care (Signed)
" °  Problem: RH Swallowing Goal: LTG Pt will demonstrate functional change in swallow as evidenced by bedside/clinical objective assessment (SLP) Description: LTG: Patient will demonstrate functional change in swallow as evidenced by bedside/clinical objective assessment (SLP) Outcome: Completed/Met   Problem: RH Problem Solving Goal: LTG Patient will demonstrate problem solving for (SLP) Description: LTG:  Patient will demonstrate problem solving for basic/complex daily situations with cues  (SLP) Outcome: Completed/Met   Problem: RH Memory Goal: LTG Patient will demonstrate ability for day to day (SLP) Description: LTG:   Patient will demonstrate ability for day to day recall/carryover during cognitive/linguistic activities with assist  (SLP) Outcome: Completed/Met   "

## 2025-01-03 DIAGNOSIS — Z87898 Personal history of other specified conditions: Secondary | ICD-10-CM

## 2025-01-03 DIAGNOSIS — D649 Anemia, unspecified: Secondary | ICD-10-CM

## 2025-01-03 LAB — CBC
HCT: 24.5 % — ABNORMAL LOW (ref 39.0–52.0)
Hemoglobin: 7.8 g/dL — ABNORMAL LOW (ref 13.0–17.0)
MCH: 29.5 pg (ref 26.0–34.0)
MCHC: 31.8 g/dL (ref 30.0–36.0)
MCV: 92.8 fL (ref 80.0–100.0)
Platelets: 223 10*3/uL (ref 150–400)
RBC: 2.64 MIL/uL — ABNORMAL LOW (ref 4.22–5.81)
RDW: 14.8 % (ref 11.5–15.5)
WBC: 6.4 10*3/uL (ref 4.0–10.5)
nRBC: 0 % (ref 0.0–0.2)

## 2025-01-03 LAB — GLUCOSE, CAPILLARY
Glucose-Capillary: 210 mg/dL — ABNORMAL HIGH (ref 70–99)
Glucose-Capillary: 112 mg/dL — ABNORMAL HIGH (ref 70–99)
Glucose-Capillary: 141 mg/dL — ABNORMAL HIGH (ref 70–99)
Glucose-Capillary: 208 mg/dL — ABNORMAL HIGH (ref 70–99)

## 2025-01-03 LAB — PREPARE RBC (CROSSMATCH)

## 2025-01-03 MED ORDER — SODIUM CHLORIDE 0.9% IV SOLUTION: Freq: Once | INTRAVENOUS | Status: DC

## 2025-01-03 MED ORDER — FLUCONAZOLE 100 MG PO TABS
100.0000 mg | ORAL_TABLET | Freq: Every day | ORAL | Status: AC
  Administered 2025-01-04 – 2025-01-07 (×4): 100 mg via ORAL
  Filled 2025-01-03 (×4): qty 1

## 2025-01-03 MED ORDER — FLUCONAZOLE 100 MG PO TABS
200.0000 mg | ORAL_TABLET | Freq: Every day | ORAL | Status: AC
  Administered 2025-01-03: 200 mg via ORAL
  Filled 2025-01-03: qty 2

## 2025-01-03 MED ORDER — FLUCONAZOLE 100 MG PO TABS: 200.0000 mg | ORAL_TABLET | Freq: Every day | ORAL | Status: DC

## 2025-01-03 MED ORDER — FLUCONAZOLE 100 MG PO TABS: 100.0000 mg | ORAL_TABLET | Freq: Every day | ORAL | Status: DC

## 2025-01-03 MED ORDER — SODIUM CHLORIDE 0.9% IV SOLUTION: Freq: Once | INTRAVENOUS | Status: AC

## 2025-01-03 MED ORDER — ACETAMINOPHEN 325 MG PO TABS
650.0000 mg | ORAL_TABLET | Freq: Once | ORAL | Status: AC
  Administered 2025-01-03: 650 mg via ORAL
  Filled 2025-01-03: qty 2

## 2025-01-03 NOTE — Progress Notes (Signed)
 Unable to start blood transfusion due to IV leak. Blood was returned to blood bank.

## 2025-01-03 NOTE — Progress Notes (Signed)
 Orthopedic Tech Progress Note Patient Details:  Shane [name] [birth year] [id number]  Order for 1-3XL and 1-4XL BK shrinkers have been routed to [facility].    Patient ID: Shane [name], male   DOB: [birth year], 84 y.o.   MRN: [id number]  Tinnie Ronal Brasil 01/03/2025, 3:28 PM

## 2025-01-03 NOTE — Plan of Care (Signed)
" °  Problem: Consults Goal: RH GENERAL PATIENT EDUCATION Description: See Patient Education module for education specifics. Outcome: Progressing   Problem: RH BOWEL ELIMINATION Goal: RH STG MANAGE BOWEL WITH ASSISTANCE Description: STG Manage Bowel with min Assistance. Outcome: Progressing   Problem: RH SKIN INTEGRITY Goal: RH STG SKIN FREE OF INFECTION/BREAKDOWN Description: Manage skin free of infection with min assistance Outcome: Progressing   Problem: RH PAIN MANAGEMENT Goal: RH STG PAIN MANAGED AT OR BELOW PT'S PAIN GOAL Description: <4 w/ prns Outcome: Progressing   "

## 2025-01-03 NOTE — Progress Notes (Signed)
 Physical Therapy Session Note  Patient Details  Name: Shane [name] MRN: [id number] Date of Birth: 13-Aug-1941  Today's Date: 01/03/2025 PT Individual Time: 0902-1002 PT Individual Time Calculation (min): 60 min   Short Term Goals: Week 1:  PT Short Term Goal 1 (Week 1): Pt will perform supine to sit transfers with min A PT Short Term Goal 2 (Week 1): Pt will perform sit to supine transfers with min A PT Short Term Goal 3 (Week 1): Pt will maintain sitting balance EOB with CGA PT Short Term Goal 4 (Week 1): Pt will perform bed <> WC transfers with max A  Skilled Therapeutic Interventions/Progress Updates:    Pt presents in bed. Focused on functional bed mobility, dynamic sitting balance, and transfers. Pt donned pants at bed level with set up assistance and then lowered HOB to perform pulling up pants with S using bedrails to support with rolling. CGA to come to EOB using HOB elevated slightly and rails for support - cues for technique and activation through trunk. S for dynamic sitting balance EOB to don new shirt and to apply R prosthesis - did need PT to assist with initiating pushing up sleep but pt able to set up sleeve otherwise. PT assisted with placement of prosthesis and pt demonstrating anterior weightshifts with S/CGA for balance to click into place. CGA for slideboard transfer with focus on head/hips relationship  and clearance for scooting demo good carryover. Set up at sink for oral hygiene at mod I level. Discussed home environment set up and modifications pt already had made in the past to allow for more efficient and independent lifestyle.   Seated core strengthening for carryover for dynamic balance and UE strengthening and endurance without UE or back support to increase challenge: 4# medicine ball for trunk rotation x 10 reps each direction 4# medicine ball for diagonal chops x 10 reps each direction 4# straight weight for anterior weightshifts while reaching forward with  weight x 10 reps   Returned back to room via TIS total A and positioned with all needs in reach.     Therapy Documentation Precautions:  Precautions Precautions: Fall Recall of Precautions/Restrictions: Intact Precaution/Restrictions Comments: watch BP LLE limb guard Restrictions Weight Bearing Restrictions Per Provider Order: Yes LLE Weight Bearing Per Provider Order: Non weight bearing   Vital Signs:  3L 02 via [place] throughout session O2 = 95-97%  Pain: Reports being premedicated for residual limb pain - 5/10.     Therapy/Group: Individual Therapy  Shane [name], PT, DPT, CBIS  01/03/2025, 2:27 PM

## 2025-01-03 NOTE — Progress Notes (Signed)
 Occupational Therapy Weekly Progress Note  Patient Details  Name: Shane [name] MRN: [id number] Date of Birth: 03/01/1941  Beginning of progress report period: December 26, 2024 End of progress report period: January 03, 2025  Today's Date: 01/03/2025 OT Individual Time: 8952-8796 OT Individual Time Calculation (min): 76 min    Patient has met 3 of 4 short term goals.  Patient able to meet all goals with significant improvement shown in all areas. Patient demonstrated difficulty with LB bathing and toileting of posterior area for peri care due to decreased ROM and core strength. 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  Patient progressing toward long term goals..  Continue plan of care.  OT Short Term Goals Week 2:  OT Short Term Goal 1 (Week 2): Patient to perform bathing at bed level with Min assist OT Short Term Goal 2 (Week 2): patient will perform toileting with mod A OT Short Term Goal 3 (Week 2): patient will complete LB dressing CGA with LRAD including limb protector  Skilled Therapeutic Interventions/Progress Updates:  /Patient agreeable to participate in OT session. Reports 7/10 pain level, pain medication administered. Patient completed slide board transfer, wc push ups, sitting balance, slide board transfer training. Patient received in room ready for OT. Patient transported to ortho gym via wc transport. Patient slide board transferred to mat table with good balance. Patient then completed dynamic sitting balance activities mod I. Patient  then utilized 2 person assist max A to complete mini stand to increase LE facilitation during slide board transfers. Patient requiring increased verbal cue for weight shifting. Patient then able to complete slides on mat table to increase sliding for all tranfers. Patient then completed UBE on level 5 for 3 minutes ,level 3 for 5 minutes to increase activity tolerance and UE strengthening related to ADL participation. Patient then returned to room all needs in reach.    Therapy Documentation Precautions:  Precautions Precautions: Fall Recall of Precautions/Restrictions: Intact Precaution/Restrictions Comments: watch BP wound VAC, LLE splint Restrictions Weight Bearing Restrictions Per Provider Order: Yes LLE Weight Bearing Per Provider Order: Non weight bearing  Therapy/Group: Individual Therapy  [name] Betul Brisky 01/03/2025, 8:09 AM

## 2025-01-04 LAB — BPAM RBC
Blood Product Expiration Date: 202602212359
Blood Product Expiration Date: 202602212359
ISSUE DATE / TIME: 202601301642
ISSUE DATE / TIME: 202601302259
Unit Type and Rh: 5100
Unit Type and Rh: 5100

## 2025-01-04 LAB — TYPE AND SCREEN
ABO/RH(D): O POS
Antibody Screen: NEGATIVE
Unit division: 0
Unit division: 0

## 2025-01-04 LAB — GLUCOSE, CAPILLARY
Glucose-Capillary: 120 mg/dL — ABNORMAL HIGH (ref 70–99)
Glucose-Capillary: 148 mg/dL — ABNORMAL HIGH (ref 70–99)
Glucose-Capillary: 147 mg/dL — ABNORMAL HIGH (ref 70–99)
Glucose-Capillary: 168 mg/dL — ABNORMAL HIGH (ref 70–99)

## 2025-01-04 NOTE — Progress Notes (Signed)
" °   01/04/25 0109  BiPAP/CPAP/SIPAP  Reason BIPAP/CPAP not in use Other(comment) (Pt states his home mask left a mark on his face and declines to wear CPAP tonight.)    "

## 2025-01-04 NOTE — Plan of Care (Signed)
" °  Problem: RH BOWEL ELIMINATION Goal: RH STG MANAGE BOWEL WITH ASSISTANCE Description: STG Manage Bowel with min Assistance. Outcome: Progressing   Problem: RH BLADDER ELIMINATION Goal: RH STG MANAGE BLADDER WITH ASSISTANCE Description: STG Manage Bladder With min  Assistance Outcome: Progressing   Problem: RH SKIN INTEGRITY Goal: RH STG SKIN FREE OF INFECTION/BREAKDOWN Description: Manage skin free of infection with min assistance 01/04/2025 2244 by Wilma Delon BIRCH, RN Outcome: Progressing 01/04/2025 2243 by Wilma Delon BIRCH, RN Outcome: Progressing   Problem: RH SAFETY Goal: RH STG ADHERE TO SAFETY PRECAUTIONS W/ASSISTANCE/DEVICE Description: STG Adhere to Safety Precautions With min Assistance/Device. 01/04/2025 2244 by Wilma Delon BIRCH, RN Outcome: Progressing 01/04/2025 2243 by Wilma Delon BIRCH, RN Outcome: Progressing   Problem: RH PAIN MANAGEMENT Goal: RH STG PAIN MANAGED AT OR BELOW PT'S PAIN GOAL Description: <4 w/ prns Outcome: Progressing   "

## 2025-01-04 NOTE — Progress Notes (Signed)
 "                                                        PROGRESS NOTE   Subjective/Complaints: No new complaints this morning BP is soft Declines CPAP for tonight as home mask left a mark on his face   ROS: Patient denies fever, new vision changes, dizziness, vomiting, diarrhea,  shortness of breath or chest pain, headache, or mood change. + Nausea-improved + Phantom pain-intermittent + Insomnia-improved + buttocks pain- improved  Objective:   No results found.  Recent Labs    01/02/25 0604 01/03/25 0640  WBC 6.6 6.4  HGB 8.2* 7.8*  HCT 24.9* 24.5*  PLT 250 223    Recent Labs    01/02/25 0604  NA 138  K 4.9  CL 98  CO2 32  GLUCOSE 74  BUN 56*  CREATININE 2.22*  CALCIUM  9.1    Intake/Output Summary (Last 24 hours) at 01/04/2025 1252 Last data filed at 01/04/2025 0915 Gross per 24 hour  Intake 1218 ml  Output 900 ml  Net 318 ml        Physical Exam: Vital Signs Blood pressure (!) 104/51, pulse (!) 59, temperature 97.6 F (36.4 C), temperature source Oral, resp. rate 17, height 5' (1.524 m), weight 99.2 kg, SpO2 100%.  Constitutional: No distress . Vital signs reviewed.  Appears comfortable lying in bed. HEENT: NCAT, EOMI, oral membranes moist Neck: supple Cardiovascular: RRR without murmur. No JVD    Respiratory/Chest: CTA Bilaterally without wheezes or rales. Normal effort. O2 3L,   GI/Abdomen: BS +, non-tender, non-distended, soft Ext: Bilateral BKA Psych: pleasant and cooperative  Right BKA well-healed. Left BKA in shrinker with dry dressing. Leg is well formed with residual edema. Sl tender to palpation BKA incision Skin: bruising noted on Ues, warm and dry Neuro:   Alert and awake, follows simple commands, CN 2-12 grossly intact, moving all 4 extremities to gravity. Stable 1/31     Assessment/Plan: 1. Functional deficits which require 3+ hours per day of interdisciplinary therapy in a comprehensive inpatient rehab setting. Physiatrist is  providing close team supervision and 24 hour management of active medical problems listed below. Physiatrist and rehab team continue to assess barriers to discharge/monitor patient progress toward functional and medical goals  Care Tool:  Bathing    Body parts bathed by patient: Right arm, Left arm, Chest, Abdomen, Face, Front perineal area, Right upper leg, Left upper leg, Right lower leg, Left lower leg   Body parts bathed by helper: Buttocks Body parts n/a: Left lower leg   Bathing assist Assist Level: Moderate Assistance - Patient 50 - 74%     Upper Body Dressing/Undressing Upper body dressing   What is the patient wearing?: Pull over shirt    Upper body assist Assist Level: Set up assist    Lower Body Dressing/Undressing Lower body dressing      What is the patient wearing?: Pants, Ace wrap/stump shrinker     Lower body assist Assist for lower body dressing: Minimal Assistance - Patient > 75%     Toileting Toileting    Toileting assist Assist for toileting: Maximal Assistance - Patient 25 - 49%     Transfers Chair/bed transfer  Transfers assist  Chair/bed transfer activity did not occur: Safety/medical concerns (weakness/fatigue/pain/nausea)  Chair/bed transfer assist  level: Contact Guard/Touching assist     Locomotion Ambulation   Ambulation assist   Ambulation activity did not occur: Safety/medical concerns (B BKA)          Walk 10 feet activity   Assist  Walk 10 feet activity did not occur: Safety/medical concerns (B BKA)        Walk 50 feet activity   Assist Walk 50 feet with 2 turns activity did not occur: Safety/medical concerns (B BKA)         Walk 150 feet activity   Assist Walk 150 feet activity did not occur: Safety/medical concerns (B BKA)         Walk 10 feet on uneven surface  activity   Assist Walk 10 feet on uneven surfaces activity did not occur: Safety/medical concerns (B BKA)          Wheelchair     Assist Is the patient using a wheelchair?: Yes Type of Wheelchair: Manual Wheelchair activity did not occur: Safety/medical concerns (weakness/fatigue/pain/nausea)  Wheelchair assist level: Dependent - Patient 0%      Wheelchair 50 feet with 2 turns activity    Assist    Wheelchair 50 feet with 2 turns activity did not occur: Safety/medical concerns (weakness/fatigue/pain/nausea)   Assist Level: Dependent - Patient 0%   Wheelchair 150 feet activity     Assist  Wheelchair 150 feet activity did not occur: Safety/medical concerns (weakness/fatigue/pain/nausea)   Assist Level: Dependent - Patient 0%   Blood pressure (!) 104/51, pulse (!) 59, temperature 97.6 F (36.4 C), temperature source Oral, resp. rate 17, height 5' (1.524 m), weight 99.2 kg, SpO2 100%.  Medical Problem List and Plan: 1. 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  Antithrombotics: -DVT/anticoagulation:  Pharmaceutical: Eliquis              -antiplatelet therapy: N/A  3. Pain: continue Neurontin  100 mg nightly, oxycodone  as needed, Robaxin  as needed  - 1/24 continue as needed oxycodone  for pain control  -1/28 increase gabapentin  to 100 mg 3 times daily for phantom pain 4. Mood/Behavior/Sleep: Rozerem  8 mg nightly, trazodone  75 mg nightly as needed             -antipsychotic agents: N/A  -1/28 continue current dose trazodone  at night for insomnia, could consider trying higher dose of insomnia continue  5. Neuropsych/cognition: This patient is not capable of making decisions on his own behalf.  6. Skin/Wound Care: continue dry dressing and shrinker to left BKA  7.  Fluids/Electrolytes/Nutrition: Routine and analysis with follow-up chemistries  8.  History of right BKA 2022.  Patient received CIR.  Intermittent use of prosthesis  9.  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  CRRT 1/10 - 1/12 transition to intermittent hemodialysis.  Follow-up renal services  -1/22-23  reviewed nephrology note, monitoring for need for dialysis  -1/26 labs appear relatively stable  -1/27 BUN and creatinine stable at 58/2.35, discussed with nephrology they plan to sign off.  If renal function does not completely recover he can be referred to outpatient f/u with [facility].  Dialysis catheter to be discontinued -order placed by nephrology .  -1/28 BUN and creatinine slightly higher at 65/2.51 continue to monitor  Recheck tomorrow  10.  Paroxysmal atrial fibrillation.  Continue Eliquis .    - 1/24 patient did have vomiting after Eliquis , discussed with pharmacy we will hold off on redosing  - Heart rate stable    01/04/2025    2:55 AM 01/04/2025    1:11 AM 01/03/2025   11:18 PM  Vitals with BMI  Systolic 104 103 898  Diastolic 51 61 52  Pulse 59 59 59    11.  Acute on chronic anemia.  Follow-up CBC.  Continue iron  supplement  - 1/25 Hemoglobin lower at 7.8, recheck tomorrow   -1/26- hgb with further dip to 7.5, likely multifactorial, normal MCV   -will check one stool for OB  1/27 hemoglobin back up to 7.8, stool OB pending.  Will remind nurses to check with next bowel movement  -1/28 IB stool positive, will contact GI- GI consulted  - 1/20 9 GI following appreciate assistance, hemoglobin today 8.2.  GI following, recommends transfusing for level less than 8 of hemoglobin.  GI not planning endoscopic evaluation unless there are additional changes.  -Will see if he is agreeble to transfusion today for HGB < 8.0-  Addendum- transfusion was ordered  12.  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  Continue NovoLog  4 units every 4 hours,  Lantus  insulin  18 units twice daily. -1/22 had mild hypoglycemia yesterday, decrease novolog  to 2 units Q4h -1/29 hypoglycemia, will decrease insulin  scale, also decrease lantus  to 15u BID -1/30 monitor response to medication change CBG (last 3)  Recent Labs    01/03/25 2103 01/04/25 0533 01/04/25 1127  GLUCAP 208* 120* 148*    13.  Hypotension.  ProAmatine  5 mg every 8 hours.  Monitor with increased mobility  - 1/23-30 BP soft but overall stable continue to monitor    01/04/2025    2:55 AM 01/04/2025    1:11 AM 01/03/2025   11:18 PM  Vitals with BMI  Systolic 104 103 898  Diastolic 51 61 52  Pulse 59 59 59    14.  Chronic systolic congestive heart failure.  Echocardiogram with ejection fraction of 30 to 35%.  Monitor for any signs of fluid overload. Daily weights -1/23 no signs of overload noted, difficult to assess weight trend suspect inaccurate readings -1/24 does not appear to have signs of fluid overload, dialysis today per nephrology   Filed Weights   01/01/25 1912 01/02/25 0500 01/03/25 0500  Weight: 99.3 kg 99 kg 99.2 kg     15.  CAD/AV block/Mobitz 1/aortic stenosis/CABG 2021.  Follow-up cardiology services. 16.  Idiopathic pulmonary fibrosis/OSA.  Followed by Dr. Geronimo.  Patient with 2 L oxygen intermittently at home.  Currently not on CPAP until tube feeds discontinued  - 1/25 consider restarting CPAP if NG tube removed, breathing treatments helping   1/26 resume CPAP today  1/27 patient was ordered for CPAP, he forgot to start yesterday plans to use tonight  1/30 patient reports tolerating CPAP okay, continue 17.  Oropharyngeal dysphagia.  Follow-up speech therapy.  Currently with Cortrak feeding tube for nutritional support.  Advance diet as tolerated. Currently NPO with ice chips allowed  - 1/23 diet was upgraded today to D3 thin by SLP  -1/25 will hold tube feedings(pt was declining also), patient would like to try eating orally especially now that nausea  is improved.  Monitor intake could  consider removing tube tomorrow if doing well.   1/26 eating well enough. Dc TF. Pt aware that he needs to push po intake.   1/30-eating majority of his meals, continue to monitor 18.  Diarrhea.  Related to tube feeds.  Flexi-Seal in place: continue Continue Imodium .  -1/23 LBM 1/21, improved continue to follow-up  -1/25 LBM  --- improved overall  1/30 LBM today   19.  GERD: continue Protonix    20.  Hyperlipidemia: continue Lipitor   21. Itching.  -Continue atarax  PRN  -sarna PRN  22.  Hyperkalemia, hyponatremia -1/23 nephrology following has ordered Lokelma , appreciate assistance Improved -1/27 potassium elevated today, nephrology ordered Lokelma  daily, continue to monitor trend Diet adjusted to low K 1/29 potassium stable at 4.9 Recheck tomorrow  23. Nausea  -hold tube feeds for now, nephrology planning dialysis today as this may be potential cause.  Abdominal x-ray ordered-no obstruction/ileus    -1/25-27 nausea has improved, continue current regimen  -1/28 continues to have intermittent nausea, will ask pharmacy to spread out medication   -1/29-30 improved  24.  Buttocks fungal rash.  -Nystatin  powder, Gerhart's as needed.  Consider  adding oral fluconazole  (discussed with cardiology yesterday - that should be okay if needed.)  - 1/30 will asked nursing to get updated image, will add oral fluconazole .  Discussed dosing with pharmacy.  LOS: 10 days A FACE TO FACE EVALUATION WAS PERFORMED  Sven SQUIBB Elijan Googe 01/04/2025, 12:52 PM     "

## 2025-01-05 LAB — BASIC METABOLIC PANEL WITH GFR
Anion gap: 7 (ref 5–15)
BUN: 52 mg/dL — ABNORMAL HIGH (ref 8–23)
CO2: 29 mmol/L (ref 22–32)
Calcium: 8.9 mg/dL (ref 8.9–10.3)
Chloride: 100 mmol/L (ref 98–111)
Creatinine, Ser: 1.91 mg/dL — ABNORMAL HIGH (ref 0.61–1.24)
GFR, Estimated: 34 mL/min — ABNORMAL LOW
Glucose, Bld: 104 mg/dL — ABNORMAL HIGH (ref 70–99)
Potassium: 5.5 mmol/L — ABNORMAL HIGH (ref 3.5–5.1)
Sodium: 135 mmol/L (ref 135–145)

## 2025-01-05 LAB — CBC
HCT: 27.5 % — ABNORMAL LOW (ref 39.0–52.0)
Hemoglobin: 8.8 g/dL — ABNORMAL LOW (ref 13.0–17.0)
MCH: 29.5 pg (ref 26.0–34.0)
MCHC: 32 g/dL (ref 30.0–36.0)
MCV: 92.3 fL (ref 80.0–100.0)
Platelets: 230 10*3/uL (ref 150–400)
RBC: 2.98 MIL/uL — ABNORMAL LOW (ref 4.22–5.81)
RDW: 15.7 % — ABNORMAL HIGH (ref 11.5–15.5)
WBC: 6.6 10*3/uL (ref 4.0–10.5)
nRBC: 0 % (ref 0.0–0.2)

## 2025-01-05 LAB — GLUCOSE, CAPILLARY
Glucose-Capillary: 106 mg/dL — ABNORMAL HIGH (ref 70–99)
Glucose-Capillary: 149 mg/dL — ABNORMAL HIGH (ref 70–99)
Glucose-Capillary: 124 mg/dL — ABNORMAL HIGH (ref 70–99)
Glucose-Capillary: 177 mg/dL — ABNORMAL HIGH (ref 70–99)

## 2025-01-05 MED ORDER — SODIUM ZIRCONIUM CYCLOSILICATE 5 G PO PACK
5.0000 g | PACK | Freq: Once | ORAL | Status: AC
  Administered 2025-01-05: 5 g via ORAL
  Filled 2025-01-05: qty 1

## 2025-01-05 NOTE — Progress Notes (Signed)
 Physical Therapy Weekly Progress Note  Patient Details  Name: Shane [name] MRN: [id number] Date of Birth: 1941-03-22  Beginning of progress report period: December 26, 2024 End of progress report period: January 06, 2025  Patient has met 4 of 4 short term goals. Pt is making great progress toward his LTGs. Pt is currently S for rolling and CGA for supine <> sit transfers. Pt is performing slide board transfers with CGA/min A though pt does need continued practice on placing board. Pt's tolerance to sitting up in TIS is improving. Pt is performing simulated car transfers with mod A.  Patient continues to demonstrate the following deficits muscle weakness and muscle joint tightness, decreased cardiorespiratoy endurance, and decreased sitting balance, decreased standing balance, and decreased balance strategies and therefore will continue to benefit from skilled PT intervention to increase functional independence with mobility.  Patient progressing toward long term goals..  Continue plan of care.  PT Short Term Goals Week 1:  PT Short Term Goal 1 (Week 1): Pt will perform supine to sit transfers with min A PT Short Term Goal 1 - Progress (Week 1): Met PT Short Term Goal 2 (Week 1): Pt will perform sit to supine transfers with min A PT Short Term Goal 2 - Progress (Week 1): Met PT Short Term Goal 3 (Week 1): Pt will maintain sitting balance EOB with CGA PT Short Term Goal 3 - Progress (Week 1): Met PT Short Term Goal 4 (Week 1): Pt will perform bed <> WC transfers with max A PT Short Term Goal 4 - Progress (Week 1): Met Week 2:  PT Short Term Goal 1 (Week 2): STG=LTG due to ELOS  Skilled Therapeutic Interventions/Progress Updates:      Therapy Documentation Precautions:  Precautions Precautions: Fall Recall of Precautions/Restrictions: Intact Precaution/Restrictions Comments: watch BP wound VAC, LLE splint Restrictions Weight Bearing Restrictions Per Provider Order: (P) Yes LLE  Weight Bearing Per Provider Order: (P) Non weight bearing  Therapy/Group: Individual Therapy  [name] Shane [name] Shane, PT, DPT 01/05/2025, 3:43 PM

## 2025-01-05 NOTE — Plan of Care (Signed)
" °  Problem: RH BLADDER ELIMINATION Goal: RH STG MANAGE BLADDER WITH ASSISTANCE Description: STG Manage Bladder With min  Assistance Outcome: Progressing   Problem: RH SAFETY Goal: RH STG ADHERE TO SAFETY PRECAUTIONS W/ASSISTANCE/DEVICE Description: STG Adhere to Safety Precautions With min Assistance/Device. Outcome: Progressing   Problem: RH PAIN MANAGEMENT Goal: RH STG PAIN MANAGED AT OR BELOW PT'S PAIN GOAL Description: <4 w/ prns Outcome: Progressing   Problem: RH KNOWLEDGE DEFICIT GENERAL Goal: RH STG INCREASE KNOWLEDGE OF SELF CARE AFTER HOSPITALIZATION Description: Manage knowledge of self care after hospitalization with min assistance from spouse using educational materials provided  Outcome: Progressing   Problem: Nutritional: Goal: Maintenance of adequate nutrition will improve Outcome: Progressing Goal: Progress toward achieving an optimal weight will improve Outcome: Progressing   Problem: Skin Integrity: Goal: Risk for impaired skin integrity will decrease Outcome: Progressing   "

## 2025-01-05 NOTE — Progress Notes (Signed)
 PMR Admission Coordinator Pre-Admission Assessment   Patient: Shane [name] is an 84 y.o., male MRN: [id number] DOB: August 11, 1941 Height: 6' (182.9 cm) Weight: 99.8 kg   Insurance Information HMO: yes    PPO:      PCP:      IPA:      80/20:      OTHER:  PRIMARY: U[employer] Medicare      Policy#: [id number]      Subscriber: pt CM Name: Birdia      Phone#: [phone]     Fax#: [phone] Pre-Cert#: [id number] auth for CIR from Shane with H&CC for admit 12/23/24 with next review date 12/30/24.  Updates due to fax listed above.        Employer:  Benefits:  Phone #: [phone]     Name:  Eff. Date: 12/05/24     Deduct: $0      Out of Pocket Max: $4200 (met $0)      Life Max: n/a CIR: $395/day for days 1-6      SNF: 20 full days Outpatient:      Co-Pay: $20/visit Home Health: 100%      Co-Pay:  DME: 80%     Co-Pay: 20% Providers: in network  SECONDARY:       Policy#:      Phone#:    Artist:       Phone#:    The Best Boy for patients in Inpatient Rehabilitation Facilities with attached Privacy Act Statement-Health Care Records was provided and verbally reviewed with: Patient and Family   Emergency Contact Information Contact Information       Name Relation Home Work Mobile    Lupton,Patricia Spouse [phone]   [phone]    Alley,Lisa Daughter [phone]   [phone]         Other Contacts   None on File        Current Medical History  Patient Admitting Diagnosis: L BKA   History of Present Illness: Pt is an 84 y/o male with PMH significant for chronic anemia, anxiety/depression, AV block/Mobitz 1, moderate aortic stenosis/PPM 2022, 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 Laneta Arzella Holland of [facility], CKD stage III, DM, GERD, hyperlipidemia, HTN, idiopathic pulmonary fibrosis/OSA on CPAP followed by Dr. 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  He presented to [facility] on 12/11/2024 with gangrenous ischemic changes to the left foot with reported rest pain.  Limb was not felt to be salvageable and pt underwent left BKA 12/11/2024 per Dr. Harden with application of wound VAC.  POD 1 he became hypotensive and subsequently transferred to the ICU.  He did require pressor support, which had been since weaned off to midodrine  5 mg every 8 hours.  He was placed on a course of linezolid /Zosyn  for suspected sepsis and completed abx course.  Echocardiogram with ejection fraction of 30 to 35% grade 3 diastolic dysfunction.  Hospitalization was complicated by AKI.  Nephrology initiated CRRT 1/10 and pt was transitioned to [facility] on 1/13.  Initially with Foley catheter for strict urine output measure and urine output has picked up and foley was removed.  Renal ultrasound 12/15/2024 showed no hydronephrosis and latest creatinine 2.72.  Patient with intermittent bouts of agitation and restlessness suspect encephalopathy/ICU delirium that has steadily improved as  patient attends therapies.  Acute on chronic anemia latest hemoglobin 8.5.  Patient was cleared to resume chronic Eliquis  for history of atrial fibrillation.  Speech therapy consulted for oropharyngeal dysphagia/moderate protein calorie malnutrition a Cortrak tube was placed currently remains n.p.o. Patient did have profuse stool output presumably from tube feeds and a Flexi-Seal was inserted.  Therapy evaluations completed and pt was recommended for a comprehensive rehab program.    Patient's medical record from [facility] has been reviewed by the rehabilitation admission coordinator and physician.   Past Medical History      Past Medical History:  Diagnosis Date   Acute blood loss anemia     Anxiety     AV block, Mobitz 1      Cataract     Chronic kidney disease      d/t DM   CKD (chronic kidney disease), stage III (HCC)     COVID-19 virus infection 07/18/2020    Last Assessment & Plan:   Formatting of this note might be different from the original.  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 10/2019 Echo: EF 55-60%, Gr2 DD. Nl RV.    Neuromuscular disorder (HCC)     Neuropathy associated with endocrine disorder     Nonobstructive CAD (coronary artery disease)      a. 2012 Cath: mod, nonobs dzs; b. 10/2016 MV: EF 60%, no ischemia.   OSA on CPAP 05/05/2017    Unattended Home Sleep Test 7/2/813-AHI 38.6/hour, desaturation to 64%, body weight 261 pounds   PONV (postoperative nausea and vomiting)     Postoperative anemia due to acute blood loss 11/07/2020   Postoperative hemorrhagic shock 03/20/2021   Sleep apnea       uses cpap asked to bring mask and tubing          Has the patient had major surgery during 100 days prior to admission? Yes   Family History   family history includes Diabetes Mellitus II in his mother; Emphysema (age of onset: 52) in his father; Heart attack in his father.   Current Medications [Current Medications]  [Current Medications]    Current Facility-Administered Medications:    acetaminophen  (TYLENOL ) 160 MG/5ML solution 500 mg, 500 mg, Per Tube, TID, Krishnan, Gokul, MD, 500 mg at 12/24/24 0845   acetaminophen  (TYLENOL ) tablet 325-650 mg, 325-650 mg, Per Tube, Q6H PRN, Duda, Marcus V, MD, 650 mg at 12/22/24  1025   acidophilus (RISAQUAD) capsule 1 capsule, 1 capsule, Oral, Daily, Gerome, Emma M, PA-C, 1 capsule at 12/24/24 9162   albuterol  (PROVENTIL ) (2.5 MG/3ML) 0.083% nebulizer solution 2.5 mg, 2.5 mg, Nebulization, Q4H PRN, Uhlorn, Garett M, RPH   apixaban  (ELIQUIS ) tablet 2.5 mg, 2.5 mg, Per Tube, BID, Hindman, Katherine G, RPH, 2.5 mg at 12/24/24 9162   ascorbic acid  (VITAMIN C ) tablet 1,000 mg, 1,000 mg, Per Tube, Daily, Cyndy Ozell DASEN, RPH, 1,000  mg at 12/24/24 9166   atorvastatin  (LIPITOR ) tablet 80 mg, 80 mg, Per Tube, Daily, Bitonti, Michael T, RPH, 80 mg at 12/24/24 9162   Chlorhexidine  Gluconate Cloth 2 % PADS 6 each, 6 each, Topical, Q0600, Rosan Deward ORN, NP, 6 each at 12/24/24 0602   dextromethorphan  (DELSYM ) 30 MG/5ML liquid 15 mg, 15 mg, Per Tube, Daily PRN, Duda, Marcus V, MD   EPINEPHrine  (EPI-PEN) injection 0.3 mg, 0.3 mg, Intramuscular, PRN, Gerome, Emma M, PA-C   feeding supplement (KATE FARMS STANDARD ENT 1.4) (tube feed) liquid 1,000 mL, 1,000 mL, Per Tube, Continuous, Sharie Bourbon, MD, Last Rate: 50 mL/hr at 12/24/24 1117, 1,000 mL at 12/24/24 1117   feeding supplement (PROSource TF20) liquid 60 mL, 60 mL, Per Tube, BID, Krishnan, Gokul, MD, 60 mL at 12/24/24 9160   ferrous sulfate  300 (60 Fe) MG/5ML syrup 300 mg, 300 mg, Per Tube, Q breakfast, Harden Jerona GAILS, MD, 300 mg at 12/24/24 9162   gabapentin  (NEURONTIN ) 250 MG/5ML solution 100 mg, 100 mg, Per Tube, QHS, Krishnan, Gokul, MD, 100 mg at 12/23/24 2240   haloperidol  lactate (HALDOL ) injection 2 mg, 2 mg, Intravenous, Q6H PRN, Krishnan, Gokul, MD   hydrOXYzine  (ATARAX ) tablet 25 mg, 25 mg, Per Tube, QHS PRN, Duda, Marcus V, MD, 25 mg at 12/21/24 0053   insulin  aspart (novoLOG ) injection 0-15 Units, 0-15 Units, Subcutaneous, Q4H, Paliwal, Aditya, MD, 2 Units at 12/24/24 0840   insulin  aspart (novoLOG ) injection 10 Units, 10 Units, Subcutaneous, Q4H, Crozier, Peyton, PA-C, 10 Units at 12/24/24 9156   insulin   glargine (LANTUS ) injection 22 Units, 22 Units, Subcutaneous, BID, Crozier, Peyton, PA-C, 22 Units at 12/24/24 0840   [COMPLETED] loperamide  HCl (IMODIUM ) 1 MG/7.5ML suspension 4 mg, 4 mg, Per Tube, TID, 4 mg at 12/19/24 1735 **FOLLOWED BY** loperamide  HCl (IMODIUM ) 1 MG/7.5ML suspension 4 mg, 4 mg, Per Tube, TID PRN, Krishnan, Gokul, MD, 4 mg at 12/21/24 1323   methocarbamol  (ROBAXIN ) tablet 500 mg, 500 mg, Per Tube, Q8H PRN, Antonetta Moccasin B, NP, 500 mg at 12/22/24 1025   midodrine  (PROAMATINE ) tablet 5 mg, 5 mg, Per Tube, Q8H, Krishnan, Gokul, MD, 5 mg at 12/24/24 9148   multivitamin with minerals tablet 1 tablet, 1 tablet, Per Tube, q AM, Cyndy Ozell DASEN, RPH, 1 tablet at 12/24/24 9167   mupirocin  ointment (BACTROBAN ) 2 % 1 Application, 1 Application, Topical, Daily PRN, Gerome, Emma M, PA-C   naphazoline-glycerin  (CLEAR EYES REDNESS) ophth solution 1-2 drop, 1-2 drop, Both Eyes, QID PRN, Krishnan, Gokul, MD   nutrition supplement (JUVEN) (JUVEN) powder packet 1 packet, 1 packet, Per Tube, BID BM, Bitonti, Michael T, RPH, 1 packet at 12/24/24 0841   olopatadine  (PATANOL) 0.1 % ophthalmic solution 1 drop, 1 drop, Both Eyes, Daily PRN, Gerome, Emma M, PA-C   ondansetron  (ZOFRAN ) injection 4 mg, 4 mg, Intravenous, Q6H PRN, Gerome, Emma M, PA-C, 4 mg at 12/24/24 0206   Oral care mouth rinse, 15 mL, Mouth Rinse, 4 times per day, Krishnan, Gokul, MD, 15 mL at 12/24/24 9156   Oral care mouth rinse, 15 mL, Mouth Rinse, PRN, Krishnan, Gokul, MD   oxyCODONE  (Oxy IR/ROXICODONE ) immediate release tablet 2.5 mg, 2.5 mg, Per Tube, Q4H PRN, Krishnan, Gokul, MD, 2.5 mg at 12/24/24 0806   pantoprazole  (PROTONIX ) injection 40 mg, 40 mg, Intravenous, BID, Hindman, Katherine G, RPH, 40 mg at 12/24/24 0830   phenol (CHLORASEPTIC) mouth spray 1 spray, 1 spray, Mouth/Throat, PRN, Gerome, Emma M, PA-C   ramelteon  (ROZEREM ) tablet 8  mg, 8 mg, Oral, QHS, Krishnan, Gokul, MD, 8 mg at 12/23/24 2239   sodium chloride   flush (NS) 0.9 % injection 3 mL, 3 mL, Intravenous, Q12H, Collins, Emma M, PA-C, 3 mL at 12/24/24 9156   sodium chloride  flush (NS) 0.9 % injection 3 mL, 3 mL, Intravenous, PRN, Gerome, Emma M, PA-C   tamsulosin  (FLOMAX ) capsule 0.4 mg, 0.4 mg, Oral, QPC supper, Krishnan, Gokul, MD   traZODone  (DESYREL ) tablet 75 mg, 75 mg, Per Tube, QHS PRN, Duda, Marcus V, MD, 75 mg at 12/21/24 2153    Patients Current Diet:  Diet Order                  Diet NPO time specified Except for: Ice Chips, Other (See Comments)  Diet effective now                         Precautions / Restrictions Precautions Precautions: Fall Precaution/Restrictions Comments: watch BP Restrictions Weight Bearing Restrictions Per Provider Order: Yes LLE Weight Bearing Per Provider Order: Non weight bearing    Has the patient had 2 or more falls or a fall with injury in the past year? No   Prior Activity Level Household: previous R BKA, transferred to power scooter with occasional assist (more assist needed recently 2/2 LLE)   Prior Functional Level Self Care: Did the patient need help bathing, dressing, using the toilet or eating? Needed some help   Indoor Mobility: Did the patient need assistance with walking from room to room (with or without device)? Independent   Stairs: Did the patient need assistance with internal or external stairs (with or without device)? Dependent   Functional Cognition: Did the patient need help planning regular tasks such as shopping or remembering to take medications? Needed some help   Patient Information Are you of Hispanic, Latino/a,or Spanish origin?: A. No, not of Hispanic, Latino/a, or Spanish origin What is your race?: A. White Do you need or want an interpreter to communicate with a doctor or health care staff?: 0. No   Patient's Response To:  Health Literacy and Transportation Is the patient able to respond to health literacy and transportation needs?: Yes Health  Literacy - How often do you need to have someone help you when you read instructions, pamphlets, or other written material from your doctor or pharmacy?: Never In the past 12 months, has lack of transportation kept you from medical appointments or from getting medications?: No In the past 12 months, has lack of transportation kept you from meetings, work, or from getting things needed for daily living?: No   Journalist, Newspaper / Equipment Home Equipment: Information systems manager, Agricultural Consultant (2 wheels), Art gallery manager, Wheelchair - manual, Medical Laboratory Scientific Officer - single point, Other (comment), Grab bars - tub/shower, Grab bars - toilet, Adaptive equipment   Prior Device Use: Indicate devices/aids used by the patient prior to current illness, exacerbation or injury? Motorized wheelchair or scooter and Orthotics/Prosthetics   Current Functional Level Cognition   Orientation Level: Oriented X4    Extremity Assessment (includes Sensation/Coordination)   Upper Extremity Assessment: Generalized weakness RUE Deficits / Details: Both hand edematous (L>R) and heavy when attempts to move them. able to flex and extend digits but not to full range. good initiation with min A to touch face RUE Coordination: decreased fine motor, decreased gross motor LUE Deficits / Details: Both hand edematous (L>R) and heavy when attempts to move them. limited initiation hand ROM; Provided PROM WFL;  limited by edema. LUE Coordination: decreased fine motor, decreased gross motor  Lower Extremity Assessment: Defer to PT evaluation RLE Deficits / Details: BKA atleast 3/5 strength LLE Deficits / Details: recnet BKA atleast 3/5 strength     ADLs   Overall ADL's : Needs assistance/impaired Grooming: Wash/dry hands, Wash/dry face, Oral care, Contact guard assist, Sitting Grooming Details (indicate cue type and reason): EOB General ADL Comments: Working on bed mobility,  sitting EOB balance today as precursor to basic ADLs, and transfers and  following commands     Mobility   Overal bed mobility: Needs Assistance Bed Mobility: Supine to Sit, Sit to Supine, Rolling Rolling: Min assist Supine to sit: Mod assist, HOB elevated, Used rails Sit to supine: Mod assist, HOB elevated, Used rails General bed mobility comments: assistance with trunk and use of bed features     Transfers   General transfer comment: pretransfer training tolerated today. Pt assisted in donning RLE prosthesis while sitting EOB and worked on scooting along EOB to right.     Ambulation / Gait / Stairs / Wheelchair Mobility   Ambulation/Gait General Gait Details: unable     Posture / Balance Dynamic Sitting Balance Sitting balance - Comments: CGA to supervision seated on EOB, min assist for safety during reaching tasks Balance Overall balance assessment: Needs assistance Sitting-balance support: No upper extremity supported, Bilateral upper extremity supported, Single extremity supported Sitting balance-Leahy Scale: Fair Sitting balance - Comments: CGA to supervision seated on EOB, min assist for safety during reaching tasks Postural control: Posterior lean     Special considerations/life events  CPAP, Oxygen intermittent 2L, Skin L BKA incision with wound vac, and Diabetic management yes    Previous Home Environment (from acute therapy documentation) Living Arrangements: Spouse/significant other, Other relatives (grandson) Available Help at Discharge: Family, Available 24 hours/day Type of Home: House Home Layout: Two level, Able to live on main level with bedroom/bathroom Home Access: Ramped entrance Bathroom Shower/Tub: Health Visitor: Handicapped height Bathroom Accessibility: Yes Home Care Services: No   Discharge Living Setting Plans for Discharge Living Setting: Patient's home, Lives with (comment) (spouse, grandson) Type of Home at Discharge: House Discharge Home Layout: Able to live on main level with  bedroom/bathroom Discharge Home Access: Ramped entrance Discharge Bathroom Shower/Tub: Walk-in shower Discharge Bathroom Toilet: Handicapped height Discharge Bathroom Accessibility: Yes How Accessible: Accessible via wheelchair (electric scooter) Does the patient have any problems obtaining your medications?: No   Social/Family/Support Systems Patient Roles: Spouse Anticipated Caregiver: spouse, Avelina Anticipated Industrial/product Designer Information: [phone] Ability/Limitations of Caregiver: none stated, has provided care for pt after previous CIR stay Caregiver Availability: 24/7 Discharge Plan Discussed with Primary Caregiver: Yes Is Caregiver In Agreement with Plan?: Yes   Goals Patient/Family Goal for Rehab: PT/OT supervision to min assist, SLP mod I Expected length of stay: 12-16 days Additional Information: Discharge plan: pt will return home with his spouse and grandson providing 24/7 support Pt/Family Agrees to Admission and willing to participate: Yes Program Orientation Provided & Reviewed with Pt/Caregiver Including Roles  & Responsibilities: Yes   Decrease burden of Care through IP rehab admission: n/a   Possible need for SNF placement upon discharge: Not anticipated.  Plan discharge home with spouse and grandson who can provide adequate assist.     Patient Condition: This patient's medical and functional status has changed since the consult dated: 12/20/24 in which the Rehabilitation Physician determined and documented that the patient's condition is appropriate for intensive rehabilitative care in an inpatient  rehabilitation facility. See History of Present Illness (above) for medical update. Functional changes are: pt is mod assist +2 for mobility and min assist for ADLs, encephalopathy is clearing. Patient's medical and functional status update has been discussed with the Rehabilitation physician and patient remains appropriate for inpatient rehabilitation. Will admit  to inpatient rehab today.   Preadmission Screen Completed By:  Reche FORBES Lowers, PT, DPT 12/24/2024 11:24 AM ______________________________________________________________________   Discussed status with Dr. Lorilee on 12/25/24 at 0930 and received approval for admission today.   Admission Coordinator:  Caitlin E Warren, PT, DPT time 1245/Date 12/25/24    Assessment/Plan: Diagnosis: Left BKA Does the need for close, 24 hr/day Medical supervision in concert with the patient's rehab needs make it unreasonable for this patient to be served in a less intensive setting? Yes Co-Morbidities requiring supervision/potential complications: Overweight, anxiety, depression, idiopathic pulmonary fibrosis, OSA, CKD stage 3, DM, HTN, HLD Due to bladder management, bowel management, safety, skin/wound care, disease management, medication administration, pain management, and patient education, does the patient require 24 hr/day rehab nursing? Yes Does the patient require coordinated care of a physician, rehab nurse, PT, OT to address physical and functional deficits in the context of the above medical diagnosis(es)? Yes Addressing deficits in the following areas: balance, endurance, locomotion, strength, transferring, bowel/bladder control, bathing, dressing, feeding, grooming, toileting, and psychosocial support, dysphagia, cognition Can the patient actively participate in an intensive therapy program of at least 3 hrs of therapy 5 days a week? Yes The potential for patient to make measurable gains while on inpatient rehab is excellent Anticipated functional outcomes upon discharge from inpatient rehab: supervision PT, supervision OT, supervision SLP Estimated rehab length of stay to reach the above functional goals is: 10-14 days Anticipated discharge destination: Home 10. Overall Rehab/Functional Prognosis: excellent     MD Signature: Sven Lorilee, MD             Revision History  Date/Time User  Provider Type Action  12/25/2024  1:00 PM Raulkar, Sven SQUIBB, MD Physician Sign  12/25/2024 12:49 PM Duanne Lovett HERO, RN Rehab Admission Coordinator Share  12/25/2024 12:49 PM Duanne Lovett HERO, RN Rehab Admission Coordinator Share  12/24/2024 11:36 AM Lowers Reche FORBES, PT Rehab Admission Coordinator Share   View Details Report

## 2025-01-05 NOTE — Progress Notes (Signed)
 "                                                        PROGRESS NOTE   Subjective/Complaints: No new complaints this morning Patient's chart reviewed- No issues reported overnight Vitals signs stable except for hypotension   ROS: Patient denies fever, new vision changes, dizziness, vomiting, diarrhea,  shortness of breath or chest pain, headache, or mood change. + Nausea-improved + Phantom pain-intermittent + Insomnia-improved + buttocks pain- improved  Objective:   No results found.  Recent Labs    01/03/25 0640 01/05/25 0525  WBC 6.4 6.6  HGB 7.8* 8.8*  HCT 24.5* 27.5*  PLT 223 230    Recent Labs    01/05/25 0525  NA 135  K 5.5*  CL 100  CO2 29  GLUCOSE 104*  BUN 52*  CREATININE 1.91*  CALCIUM  8.9    Intake/Output Summary (Last 24 hours) at 01/05/2025 1337 Last data filed at 01/05/2025 9277 Gross per 24 hour  Intake 720 ml  Output 1075 ml  Net -355 ml        Physical Exam: Vital Signs Blood pressure (!) 102/56, pulse 66, temperature 97.8 F (36.6 C), temperature source Oral, resp. rate 16, height 5' (1.524 m), weight 97.8 kg, SpO2 100%.  Constitutional: No distress . Vital signs reviewed.  Appears comfortable lying in bed. HEENT: NCAT, EOMI, oral membranes moist Neck: supple Cardiovascular: RRR without murmur. No JVD    Respiratory/Chest: CTA Bilaterally without wheezes or rales. Normal effort. O2 3L,   GI/Abdomen: BS +, non-tender, non-distended, soft Ext: Bilateral BKA Psych: pleasant and cooperative  Right BKA well-healed. Left BKA in shrinker with dry dressing. Leg is well formed with residual edema. Sl tender to palpation BKA incision Skin: bruising noted on Ues, warm and dry Neuro:   Alert and awake, follows simple commands, CN 2-12 grossly intact, moving all 4 extremities to gravity. Stable 2/1     Assessment/Plan: 1. Functional deficits which require 3+ hours per day of interdisciplinary therapy in a comprehensive inpatient rehab  setting. Physiatrist is providing close team supervision and 24 hour management of active medical problems listed below. Physiatrist and rehab team continue to assess barriers to discharge/monitor patient progress toward functional and medical goals  Care Tool:  Bathing    Body parts bathed by patient: Right arm, Left arm, Chest, Abdomen, Face, Front perineal area, Right upper leg, Left upper leg, Right lower leg, Left lower leg   Body parts bathed by helper: Buttocks Body parts n/a: Left lower leg   Bathing assist Assist Level: Moderate Assistance - Patient 50 - 74%     Upper Body Dressing/Undressing Upper body dressing   What is the patient wearing?: Pull over shirt    Upper body assist Assist Level: Set up assist    Lower Body Dressing/Undressing Lower body dressing      What is the patient wearing?: Pants, Ace wrap/stump shrinker     Lower body assist Assist for lower body dressing: Minimal Assistance - Patient > 75%     Toileting Toileting    Toileting assist Assist for toileting: Maximal Assistance - Patient 25 - 49%     Transfers Chair/bed transfer  Transfers assist  Chair/bed transfer activity did not occur: Safety/medical concerns (weakness/fatigue/pain/nausea)  Chair/bed transfer assist level: Contact Guard/Touching assist  Locomotion Ambulation   Ambulation assist   Ambulation activity did not occur: Safety/medical concerns (B BKA)          Walk 10 feet activity   Assist  Walk 10 feet activity did not occur: Safety/medical concerns (B BKA)        Walk 50 feet activity   Assist Walk 50 feet with 2 turns activity did not occur: Safety/medical concerns (B BKA)         Walk 150 feet activity   Assist Walk 150 feet activity did not occur: Safety/medical concerns (B BKA)         Walk 10 feet on uneven surface  activity   Assist Walk 10 feet on uneven surfaces activity did not occur: Safety/medical concerns (B BKA)          Wheelchair     Assist Is the patient using a wheelchair?: Yes Type of Wheelchair: Manual Wheelchair activity did not occur: Safety/medical concerns (weakness/fatigue/pain/nausea)  Wheelchair assist level: Dependent - Patient 0%      Wheelchair 50 feet with 2 turns activity    Assist    Wheelchair 50 feet with 2 turns activity did not occur: Safety/medical concerns (weakness/fatigue/pain/nausea)   Assist Level: Dependent - Patient 0%   Wheelchair 150 feet activity     Assist  Wheelchair 150 feet activity did not occur: Safety/medical concerns (weakness/fatigue/pain/nausea)   Assist Level: Dependent - Patient 0%   Blood pressure (!) 102/56, pulse 66, temperature 97.8 F (36.6 C), temperature source Oral, resp. rate 16, height 5' (1.524 m), weight 97.8 kg, SpO2 100%.  Medical Problem List and Plan: 1. 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  Antithrombotics: -DVT/anticoagulation:  Pharmaceutical: Eliquis              -antiplatelet therapy: N/A  3. Pain: continue Neurontin  100 mg nightly, oxycodone  as needed, Robaxin  as needed  - 1/24 continue as needed oxycodone  for pain control  -1/28 increase gabapentin  to 100 mg 3 times daily for phantom pain 4. Mood/Behavior/Sleep: Rozerem  8 mg nightly, trazodone  75 mg nightly as needed             -antipsychotic agents: N/A  -1/28 continue current dose trazodone  at night for insomnia, could consider trying higher dose of insomnia continue  5. Neuropsych/cognition: This patient is not capable of making decisions on his own behalf.  6. Skin/Wound Care: continue dry dressing and shrinker to left BKA  7.  Fluids/Electrolytes/Nutrition: Routine and analysis with follow-up chemistries  8.  History of right BKA 2022.  Patient received CIR.  Intermittent use of prosthesis  9.  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  CRRT 1/10 - 1/12 transition to intermittent hemodialysis.  Follow-up renal services  -1/22-23  reviewed nephrology note, monitoring for need for dialysis  -1/26 labs appear relatively stable  -1/27 BUN and creatinine stable at 58/2.35, discussed with nephrology they plan to sign off.  If renal function does not completely recover he can be referred to outpatient f/u with [facility].  Dialysis catheter to be discontinued -order placed by  nephrology .  -1/28 BUN and creatinine slightly higher at 65/2.51 continue to monitor  Recheck tomorrow  10.  Paroxysmal atrial fibrillation.  Continue Eliquis .    - 1/24 patient did have vomiting after Eliquis , discussed with pharmacy we will hold off on redosing  - Heart rate stable    01/05/2025    7:02 AM 01/05/2025    3:53 AM 01/04/2025    7:44 PM  Vitals with BMI  Weight 215 lbs 10 oz    BMI 42.11    Systolic  102 114  Diastolic  56 52  Pulse  66 59    11.  Acute on chronic anemia.  Follow-up CBC.  Continue iron  supplement  - 1/25 Hemoglobin lower at 7.8, recheck tomorrow   -1/26- hgb with further dip to 7.5, likely multifactorial, normal MCV   -will check one stool for OB  1/27 hemoglobin back up to 7.8, stool OB pending.  Will remind nurses to check with next bowel movement  -1/28 IB stool positive, will contact GI- GI consulted  - 1/20 9 GI following appreciate assistance, hemoglobin today 8.2.  GI following, recommends transfusing for level less than 8 of hemoglobin.  GI not planning endoscopic evaluation unless there are additional changes.  -Will see if he is agreeble to transfusion today for HGB < 8.0-  Addendum- transfusion was ordered  12.  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  Continue  NovoLog  4 units every 4 hours, Lantus  insulin  18 units twice daily. -1/22 had mild hypoglycemia yesterday, decrease novolog  to 2 units Q4h -1/29 hypoglycemia, will decrease insulin  scale, also decrease lantus  to 15u BID -1/30 monitor response to medication change CBG (last 3)  Recent Labs    01/04/25 2046 01/05/25 0533 01/05/25 1214  GLUCAP 168* 106* 149*    13.  Hypotension.  ProAmatine  5 mg every 8 hours.  Monitor with increased mobility  - 1/23-30 BP soft but overall stable continue to monitor    01/05/2025    7:02 AM 01/05/2025    3:53 AM 01/04/2025    7:44 PM  Vitals with BMI  Weight 215 lbs 10 oz    BMI 42.11    Systolic  102 114  Diastolic  56 52  Pulse  66 59    14.  Chronic systolic congestive heart failure.  Echocardiogram with ejection fraction of 30 to 35%.  Monitor for any signs of fluid overload. Daily weights -1/23 no signs of overload noted, difficult to assess weight trend suspect inaccurate readings -1/24 does not appear to have signs of fluid overload, dialysis today per nephrology   Filed Weights   01/02/25 0500 01/03/25 0500 01/05/25 0702  Weight: 99 kg 99.2 kg 97.8 kg     15.  CAD/AV block/Mobitz 1/aortic stenosis/CABG 2021.  Follow-up cardiology services. 16.  Idiopathic pulmonary fibrosis/OSA.  Followed by Dr. Geronimo.  Patient with 2 L oxygen intermittently at home.  Currently not on CPAP until tube feeds discontinued  - 1/25 consider restarting CPAP if NG tube removed, breathing treatments helping   1/26 resume CPAP today  1/27 patient was ordered for CPAP, he forgot to start yesterday plans to use tonight  1/30 patient reports tolerating CPAP okay, continue 17.  Oropharyngeal dysphagia.  Follow-up speech therapy.  Currently with Cortrak feeding tube for nutritional support.  Advance diet as tolerated. Currently NPO with ice chips allowed  - 1/23 diet was upgraded today to D3 thin by SLP  -1/25 will hold tube feedings(pt was  declining also),  patient would like to try eating orally especially now that nausea is improved.  Monitor intake could consider removing tube tomorrow if doing well.   1/26 eating well enough. Dc TF. Pt aware that he needs to push po intake.   1/30-eating majority of his meals, continue to monitor 18.  Diarrhea.  Related to tube feeds.  Flexi-Seal in place: continue Continue Imodium .  -1/23 LBM 1/21, improved continue to follow-up  -1/25 LBM  --- improved overall  1/30 LBM    19.  GERD: continue Protonix    20.  Hyperlipidemia: continue Lipitor   21. Itching.  -continue atarax  PRN  -sarna PRN  22.  Hyperkalemia, hyponatremia -1/23 nephrology following has ordered Lokelma , appreciate assistance Improved -1/27 potassium elevated today, nephrology ordered Lokelma  daily, continue to monitor trend Diet adjusted to low K 1/29 potassium stable at 4.9 2/1: K+ 5.5, additional 5g Lokelma  ordered  23. Nausea  -hold tube feeds for now, nephrology planning dialysis today as this may be potential cause.  Abdominal x-ray ordered-no obstruction/ileus    -1/25-27 nausea has improved, continue current regimen  -1/28 continues to have intermittent nausea, will ask pharmacy to spread out medication   -1/29-30 improved  24.  Buttocks fungal rash.  -Nystatin  powder, Gerhart's as needed.  Consider  adding oral fluconazole  (discussed with cardiology yesterday - that should be okay if needed.)  - 1/30 will asked nursing to get updated image, will add oral fluconazole .  Discussed dosing with pharmacy.  LOS: 11 days A FACE TO FACE EVALUATION WAS PERFORMED  Shane [name] Shane [name] 01/05/2025, 1:37 PM     "

## 2025-01-06 LAB — BASIC METABOLIC PANEL WITH GFR
Anion gap: 7 (ref 5–15)
BUN: 48 mg/dL — ABNORMAL HIGH (ref 8–23)
CO2: 30 mmol/L (ref 22–32)
Calcium: 8.8 mg/dL — ABNORMAL LOW (ref 8.9–10.3)
Chloride: 97 mmol/L — ABNORMAL LOW (ref 98–111)
Creatinine, Ser: 2.01 mg/dL — ABNORMAL HIGH (ref 0.61–1.24)
GFR, Estimated: 32 mL/min — ABNORMAL LOW
Glucose, Bld: 111 mg/dL — ABNORMAL HIGH (ref 70–99)
Potassium: 5.2 mmol/L — ABNORMAL HIGH (ref 3.5–5.1)
Sodium: 134 mmol/L — ABNORMAL LOW (ref 135–145)

## 2025-01-06 LAB — CBC
HCT: 27.1 % — ABNORMAL LOW (ref 39.0–52.0)
Hemoglobin: 8.5 g/dL — ABNORMAL LOW (ref 13.0–17.0)
MCH: 29.5 pg (ref 26.0–34.0)
MCHC: 31.4 g/dL (ref 30.0–36.0)
MCV: 94.1 fL (ref 80.0–100.0)
Platelets: 206 10*3/uL (ref 150–400)
RBC: 2.88 MIL/uL — ABNORMAL LOW (ref 4.22–5.81)
RDW: 15.5 % (ref 11.5–15.5)
WBC: 6.5 10*3/uL (ref 4.0–10.5)
nRBC: 0 % (ref 0.0–0.2)

## 2025-01-06 LAB — GLUCOSE, CAPILLARY
Glucose-Capillary: 108 mg/dL — ABNORMAL HIGH (ref 70–99)
Glucose-Capillary: 84 mg/dL (ref 70–99)
Glucose-Capillary: 155 mg/dL — ABNORMAL HIGH (ref 70–99)

## 2025-01-06 MED ORDER — SODIUM ZIRCONIUM CYCLOSILICATE 5 G PO PACK
5.0000 g | PACK | Freq: Once | ORAL | Status: AC
  Administered 2025-01-06: 5 g via ORAL
  Filled 2025-01-06: qty 1

## 2025-01-06 NOTE — Progress Notes (Signed)
 Physical Therapy Session Note  Patient Details  Name: Shane [name] MRN: [id number] Date of Birth: Aug 25, 1941  Today's Date: 01/06/2025 PT Individual Time: 0802-0915 PT Individual Time Calculation (min): 73 min   Today's Date: 01/06/2025 PT Individual Time: 8696-8584 PT Individual Time Calculation (min): 72 min   Short Term Goals: Week 2:  PT Short Term Goal 1 (Week 2): STG=LTG due to ELOS  Skilled Therapeutic Interventions/Progress Updates:     Treatment 1: Pt semi-reclined in bed upon arrival. Pt reports 4/10 pain in L residual limb, premedicated. Pt stated he needed more cream on his buttock prior to starting session. Agreeable to therapy. Session emphasized functional strengthening, endurance/activity tolerance, and dynamic balance with transfers. Pt able to don pants in supine position with mod A. Pt changed shirt seated EOB with SU assist. Pt sat to EOB with S - reported some dizziness that dissipated with rest. Pt practiced SBT EOB to electric scooter placed adjacent to bed with seat at ~45 degree angle to EOB - pt performed with CGA and ++ time. Pt then drove scooter down to ortho gym and practiced SBT to simulated car by pulling scooter next to car seat and transferring laterally with min A and VC. Pt reported feeling pleased with his progress with slide board transfers overall. PT and pt discussed possibility of move up DC date due to progress - PT sent secure chat to team for further input. Pt drove scooter back to room. Pt performed lateral SBT from scooter seat to TIS with min A. Pt positioned for comfort in tilted position with all needs in reach at end of session.  Treatment 2: Pt semi-reclined upon arrival. Pt denies pain, endorses heartburn from lunch, and agreeable to therapy. Session emphasized functional strengthening, endurance/activity tolerance, and dynamic balance with transfers. Pt sat to EOB with HOB elevated with S using bed rails. Pt practiced slide board lateral  transfer EOB <> electric scooter with and without R prosthesis with min A without prosthesis and CGA with prosthesis on. Pt set up for lateral SBT scooter to TIS, however, pt reported sudden L chest pain, increased dizziness, and SOB/difficulty breathing. PT assessed vitals and rehab tech notified nursing who quickly arrived to room. BP 123/93, SpO2 100%, HR 66. Pt continued to report SOB, nursing increased O2 to 4L. Nursing called to notify PA. Nursing administered Zofran . Pt stated he felt as if he was able to vomit that he would feel better though unable. Pt transferred back to bed via SBT with min/mod A due to fatigue and SOB. Pt doffed prosthesis and limb protector dependent. Pt returned to supine with S. Pt repositioned in bed dependent +2. Pt reported reduction in chest pain upon lying down. Bed alarm on and all needs in reach at end of session.  Therapy Documentation Precautions:  Precautions Precautions: Fall Recall of Precautions/Restrictions: Intact Precaution/Restrictions Comments: watch BP wound VAC, LLE splint Restrictions Weight Bearing Restrictions Per Provider Order: Yes LLE Weight Bearing Per Provider Order: Non weight bearing  Therapy/Group: Individual Therapy  Comer CHRISTELLA Levora Comer Levora, PT, DPT 01/06/2025, 7:43 AM

## 2025-01-06 NOTE — Progress Notes (Signed)
 Patient ID: Shane [name], male   DOB: Feb 12, 1941, 85 y.o.   MRN: [id number]  Submitted order for 30' slide board via [employer]

## 2025-01-06 NOTE — Progress Notes (Signed)
 Pt c/o of chest pain 8/10 after lunch. Mentioned food was really greasy. Zofran  was given and BP 123/93, HR 66, O2 100 on 4L [place], Temp 96.2, RR 18. PA notified.

## 2025-01-06 NOTE — Progress Notes (Signed)
 Occupational Therapy Session Note  Patient Details  Name: Shane [name] MRN: [id number] Date of Birth: 02-Dec-1941  Today's Date: 01/06/2025 OT Individual Time: 1050-1130 OT Individual Time Calculation (min): 40 min   Short Term Goals: Week 2:  OT Short Term Goal 1 (Week 2): Patient to perform bathing at bed level with Min assist OT Short Term Goal 2 (Week 2): patient will perform toileting with mod A OT Short Term Goal 3 (Week 2): patient will complete LB dressing CGA with LRAD including limb protector  Skilled Therapeutic Interventions/Progress Updates:  Pt greeted in [facility] for skilled OT session with focus on BADL retraining. Pt received and maintained on 3L O2.   Pain: Pt with no reports of pain. OT offering intermediate rest breaks and positioning suggestions throughout session to address pain/fatigue and maximize participation/safety in session.   Functional Transfers: Pt required Min A for SB placement and transfer from WC<>EOB.   Self Care Tasks: Pt completes the following self care tasks with levels of assistance noted below, UB: Pt required Min A for UB bathing and application of moisturizer to back. Pt required set-up assist for UB dressing.  LB: Pt independently doffed prosthetic and limb guard. OTS provides assistance for doffing shrinker due to sensitivity. Pt re-educated on need to change shrinker daily. Pt initiates LB dressing with lateral leans, transitioning to supine for completion of task. Reports being unable to reach backside for posterior care. Notes having adaptive equipment at home for toileting. Pt found to have incontinent BM, total A provided for cleaning. Medicated ointment/powder applied for skin integrity. Total A for brief change, pt able to assist by rolling laterally with supervision. Supervision provided for donning clean pants in supine.   Pt remained in bed with 4Ps assessed and immediate needs met. Pt continues to be appropriate for skilled OT  intervention to promote further functional independence in ADLs/IADLs.    Therapy Documentation Precautions:  Precautions Precautions: Fall Recall of Precautions/Restrictions: Intact Precaution/Restrictions Comments: watch BP wound VAC, LLE splint Restrictions Weight Bearing Restrictions Per Provider Order: Yes LLE Weight Bearing Per Provider Order: Non weight bearing   Therapy/Group: Individual Therapy  Nereida Habermann, OTR/L, MSOT Davi Kroon, OTS  01/06/2025, 8:05 AM

## 2025-01-07 DIAGNOSIS — K5900 Constipation, unspecified: Secondary | ICD-10-CM

## 2025-01-07 LAB — GLUCOSE, CAPILLARY
Glucose-Capillary: 131 mg/dL — ABNORMAL HIGH (ref 70–99)
Glucose-Capillary: 96 mg/dL (ref 70–99)
Glucose-Capillary: 123 mg/dL — ABNORMAL HIGH (ref 70–99)
Glucose-Capillary: 118 mg/dL — ABNORMAL HIGH (ref 70–99)
Glucose-Capillary: 164 mg/dL — ABNORMAL HIGH (ref 70–99)

## 2025-01-07 LAB — BASIC METABOLIC PANEL WITH GFR
Anion gap: 6 (ref 5–15)
BUN: 46 mg/dL — ABNORMAL HIGH (ref 8–23)
CO2: 32 mmol/L (ref 22–32)
Calcium: 8.9 mg/dL (ref 8.9–10.3)
Chloride: 98 mmol/L (ref 98–111)
Creatinine, Ser: 1.96 mg/dL — ABNORMAL HIGH (ref 0.61–1.24)
GFR, Estimated: 33 mL/min — ABNORMAL LOW
Glucose, Bld: 106 mg/dL — ABNORMAL HIGH (ref 70–99)
Potassium: 5.5 mmol/L — ABNORMAL HIGH (ref 3.5–5.1)
Sodium: 135 mmol/L (ref 135–145)

## 2025-01-07 MED ORDER — SORBITOL 70 % SOLN
30.0000 mL | Freq: Every day | Status: DC | PRN
  Administered 2025-01-09: 30 mL via ORAL
  Filled 2025-01-07: qty 30

## 2025-01-07 MED ORDER — SODIUM ZIRCONIUM CYCLOSILICATE 10 G PO PACK
10.0000 g | PACK | Freq: Three times a day (TID) | ORAL | Status: AC
  Administered 2025-01-07 (×2): 10 g via ORAL
  Filled 2025-01-07 (×3): qty 1

## 2025-01-07 MED ORDER — POLYETHYLENE GLYCOL 3350 17 G PO PACK
17.0000 g | PACK | Freq: Every day | ORAL | Status: DC
  Administered 2025-01-07 – 2025-01-10 (×4): 17 g via ORAL
  Filled 2025-01-07 (×4): qty 1

## 2025-01-07 MED ORDER — INSULIN GLARGINE 100 UNIT/ML ~~LOC~~ SOLN
14.0000 [IU] | Freq: Two times a day (BID) | SUBCUTANEOUS | Status: DC
  Administered 2025-01-07 – 2025-01-10 (×6): 14 [IU] via SUBCUTANEOUS
  Filled 2025-01-07 (×7): qty 0.14

## 2025-01-07 MED ORDER — CALCIUM CARBONATE ANTACID 500 MG PO CHEW: 1.0000 | CHEWABLE_TABLET | Freq: Three times a day (TID) | ORAL | Status: DC | PRN

## 2025-01-07 NOTE — Progress Notes (Signed)
 "                                                        PROGRESS NOTE   Subjective/Complaints: Reports LBM 2 days ago (last recorded about 2/2) but still feels a little constipated. Had some indigestion yesterday that has improved today.    ROS: Patient denies chills, dizziness, vomiting, diarrhea,  shortness of breath or chest pain, headache, or mood change.  + Phantom pain-intermittent-overall controlled + buttocks pain- improved + indigestion + Constipation   Objective:   No results found.  Recent Labs    01/05/25 0525 01/06/25 0452  WBC 6.6 6.5  HGB 8.8* 8.5*  HCT 27.5* 27.1*  PLT 230 206    Recent Labs    01/06/25 0452 01/07/25 0545  NA 134* 135  K 5.2* 5.5*  CL 97* 98  CO2 30 32  GLUCOSE 111* 106*  BUN 48* 46*  CREATININE 2.01* 1.96*  CALCIUM  8.8* 8.9    Intake/Output Summary (Last 24 hours) at 01/07/2025 1216 Last data filed at 01/07/2025 1100 Gross per 24 hour  Intake 730 ml  Output 1775 ml  Net -1045 ml        Physical Exam: Vital Signs Blood pressure (!) 107/54, pulse 61, temperature (!) 97.5 F (36.4 C), temperature source Oral, resp. rate 16, height 5' (1.524 m), weight 99.2 kg, SpO2 100%.  Constitutional: No distress . Vital signs reviewed.  Appears comfortable lying in bed. HEENT: NCAT, EOMI, oral membranes moist Neck: supple Cardiovascular: RRR without murmur. No JVD    Respiratory/Chest: CTA Bilaterally without wheezes or rales. Normal effort. O2 4L,   GI/Abdomen: BS +, non-tender, non-distended, soft Ext: Bilateral BKA Psych: pleasant and cooperative  Right BKA well-healed. Left BKA in shrinker with dry dressing and limb protector.  Leg is well formed with residual edema. Sl tender to palpation BKA incision Skin: bruising noted on Ues, warm and dry Neuro:   Alert and awake, follows simple commands, CN 2-12 grossly intact, moving all 4 extremities to gravity.   Prior neuro assessment is c/w today's exam  01/07/2025.      Assessment/Plan: 1. Functional deficits which require 3+ hours per day of interdisciplinary therapy in a comprehensive inpatient rehab setting. Physiatrist is providing close team supervision and 24 hour management of active medical problems listed below. Physiatrist and rehab team continue to assess barriers to discharge/monitor patient progress toward functional and medical goals  Care Tool:  Bathing    Body parts bathed by patient: Right arm, Left arm, Chest, Abdomen, Face, Front perineal area, Right upper leg, Left upper leg, Right lower leg, Left lower leg   Body parts bathed by helper: Buttocks Body parts n/a: Left lower leg   Bathing assist Assist Level: Moderate Assistance - Patient 50 - 74%     Upper Body Dressing/Undressing Upper body dressing   What is the patient wearing?: Pull over shirt    Upper body assist Assist Level: Set up assist    Lower Body Dressing/Undressing Lower body dressing      What is the patient wearing?: Pants, Ace wrap/stump shrinker     Lower body assist Assist for lower body dressing: Minimal Assistance - Patient > 75%     Toileting Toileting    Toileting assist Assist for toileting: Maximal Assistance - Patient 25 -  49%     Transfers Chair/bed transfer  Transfers assist  Chair/bed transfer activity did not occur: Safety/medical concerns (weakness/fatigue/pain/nausea)  Chair/bed transfer assist level: Contact Guard/Touching assist     Locomotion Ambulation   Ambulation assist   Ambulation activity did not occur: Safety/medical concerns (B BKA)          Walk 10 feet activity   Assist  Walk 10 feet activity did not occur: Safety/medical concerns (B BKA)        Walk 50 feet activity   Assist Walk 50 feet with 2 turns activity did not occur: Safety/medical concerns (B BKA)         Walk 150 feet activity   Assist Walk 150 feet activity did not occur: Safety/medical concerns (B BKA)          Walk 10 feet on uneven surface  activity   Assist Walk 10 feet on uneven surfaces activity did not occur: Safety/medical concerns (B BKA)         Wheelchair     Assist Is the patient using a wheelchair?: Yes Type of Wheelchair: Manual Wheelchair activity did not occur: Safety/medical concerns (weakness/fatigue/pain/nausea)  Wheelchair assist level: Dependent - Patient 0%      Wheelchair 50 feet with 2 turns activity    Assist    Wheelchair 50 feet with 2 turns activity did not occur: Safety/medical concerns (weakness/fatigue/pain/nausea)   Assist Level: Dependent - Patient 0%   Wheelchair 150 feet activity     Assist  Wheelchair 150 feet activity did not occur: Safety/medical concerns (weakness/fatigue/pain/nausea)   Assist Level: Dependent - Patient 0%   Blood pressure (!) 107/54, pulse 61, temperature (!) 97.5 F (36.4 C), temperature source Oral, resp. rate 16, height 5' (1.524 m), weight 99.2 kg, SpO2 100%.  Medical Problem List and Plan: 1. 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  Antithrombotics: -DVT/anticoagulation:  Pharmaceutical: Eliquis              -antiplatelet therapy: N/A  3. Pain: continue Neurontin  100 mg nightly, oxycodone  as needed, Robaxin  as needed  - 1/24 continue as needed oxycodone  for pain control  -1/28 increase gabapentin  to 100 mg 3 times daily for phantom pain 4. Mood/Behavior/Sleep: Rozerem  8 mg nightly, trazodone  75 mg nightly as needed             -antipsychotic agents: N/A  -1/28 continue current  dose trazodone  at night for insomnia, could consider trying higher dose of insomnia continue  5. Neuropsych/cognition: This patient is not capable of making decisions on his own behalf.  6. Skin/Wound Care: continue dry dressing and shrinker to left BKA  7. Fluids/Electrolytes/Nutrition: Routine and analysis with follow-up chemistries  8.  History of right BKA 2022.  Patient received CIR.  Intermittent use of prosthesis  9.  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  CRRT 1/10 - 1/12 transition to intermittent hemodialysis.  Follow-up renal services  -1/22-23  reviewed  nephrology note, monitoring for need for dialysis  -1/26 labs appear relatively stable  -1/27 BUN and creatinine stable at 58/2.35, discussed with nephrology they plan to sign off.  If renal function does not completely recover he can be referred to outpatient f/u with [facility].  Dialysis catheter to be discontinued -order placed by nephrology .  -1/28 BUN and creatinine slightly higher at 65/2.51 continue to monitor  -2/3 BUN and Cr. Stable at 1.96/46  10.  Paroxysmal atrial fibrillation.  Continue Eliquis .    - 1/24 patient did have vomiting after Eliquis , discussed with pharmacy we will hold off on redosing  - Heart rate stable    01/07/2025    5:00 AM 01/07/2025    3:41 AM 01/06/2025    8:31 PM  Vitals with BMI  Weight 218 lbs 11 oz    BMI 42.71    Systolic  107 116  Diastolic  54 55  Pulse  61 59    11.  Acute on chronic anemia.  Follow-up CBC.  Continue iron  supplement  - 1/25 Hemoglobin lower at 7.8, recheck tomorrow   -1/26- hgb with further dip to 7.5, likely multifactorial, normal MCV   -will check one stool for OB  1/27 hemoglobin back up to 7.8, stool OB pending.  Will remind nurses to check with next bowel movement  -1/28 IB stool positive, will contact GI- GI consulted  - 1/20 9 GI following appreciate assistance, hemoglobin today 8.2.  GI following, recommends transfusing for level less than 8 of  hemoglobin.  GI not planning endoscopic evaluation unless there are additional changes.  -Will see if he is agreeble to transfusion today for HGB < 8.0-  Addendum- Blood transfusion was ordered  2/2 hemoglobin stable at 8.5  Recheck tomorrow   12.  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  Continue NovoLog  4 units every 4 hours, Lantus  insulin  18 units twice daily. -1/22 had mild hypoglycemia yesterday, decrease novolog  to 2 units Q4h -1/29 hypoglycemia, will decrease insulin  scale, also decrease lantus  to 15u BID -1/30 monitor response to medication change -2/3 Decrease lantus  to 14u BID CBG (last 3)  Recent Labs    01/06/25 2119 01/07/25 0550 01/07/25 1153  GLUCAP 155* 96 123*    13.  Hypotension.  ProAmatine  5 mg every 8 hours.  Monitor with increased mobility  - 1/23-2/3 BP soft but overall stable continue to monitor    01/07/2025    5:00 AM 01/07/2025    3:41 AM 01/06/2025    8:31 PM  Vitals with BMI  Weight 218 lbs 11 oz    BMI 42.71    Systolic  107 116  Diastolic  54 55  Pulse  61 59    14.  Chronic systolic congestive heart failure.  Echocardiogram with ejection fraction of 30 to 35%.  Monitor for any signs of fluid overload. Daily weights -1/23 no signs of overload noted, difficult to assess weight trend suspect inaccurate readings -1/24 does not appear to have signs of fluid overload, dialysis today per nephrology   Filed Weights   01/05/25 0702 01/06/25 0447 01/07/25 0500  Weight: 97.8 kg 97.5 kg 99.2 kg     15.  CAD/AV block/Mobitz 1/aortic stenosis/CABG 2021.  Follow-up cardiology services. 16.  Idiopathic pulmonary fibrosis/OSA.  Followed by Dr. Geronimo.  Patient with 2 L oxygen intermittently at home.  Currently not on CPAP until tube feeds discontinued  - 1/25 consider restarting CPAP if NG tube removed, breathing  treatments helping   1/26 resume CPAP today  1/27 patient was ordered for CPAP, he forgot to start yesterday plans to  use tonight  1/30 patient reports tolerating CPAP okay, continue 17.  Oropharyngeal dysphagia.  Follow-up speech therapy.  Currently with Cortrak feeding tube for nutritional support.  Advance diet as tolerated. Currently NPO with ice chips allowed  - 1/23 diet was upgraded today to D3 thin by SLP  -1/25 will hold tube feedings(pt was declining also), patient would like to try eating orally especially now that nausea is improved.  Monitor intake could consider removing tube tomorrow if doing well.   1/26 eating well enough. Dc TF. Pt aware that he needs to push po intake.   1/30-eating majority of his meals, continue to monitor  18. Diarrhea/constipation.  Related to tube feeds.  Flexi-Seal in place: continue Continue Imodium .  -1/23 LBM 1/21, improved continue to follow-up  -1/25 LBM  --- improved overall  2/3 pt reports feeling a little constipated. Add miralax  and PRN sorbtiol. LBM 2/2   19.  GERD: continue Protonix   -2/3 tums PRN   20.  Hyperlipidemia: continue Lipitor   21. Itching.  -continue atarax  PRN  -sarna PRN  22.  Hyperkalemia, hyponatremia -1/23 nephrology following has ordered Lokelma , appreciate assistance Improved -1/27 potassium elevated today, nephrology ordered Lokelma  daily, continue to monitor trend Diet adjusted to low K 1/29 potassium stable at 4.9 2/1: K+ 5.5, additional 5g Lokelma  ordered 2/2 K+ down to 5.2, will give additional 5g lokelma  again today 2/3 K+ a little higher today, discuss pharmacy, will give 10g for 3 doses today. Low K diet  23. Nausea  -hold tube feeds for now, nephrology planning dialysis today as this may be potential cause.  Abdominal x-ray ordered-no obstruction/ileus    -1/25-27 nausea has improved, continue current regimen  -1/28 continues to have intermittent nausea, will ask pharmacy to spread out medication   -1/29-30 improved  24.  Buttocks fungal rash.  -Nystatin  powder, Gerhart's as needed.  Consider  adding oral  fluconazole  (discussed with cardiology yesterday - that should be okay if needed.)  - 1/30 will asked nursing to get updated image, will add oral fluconazole .  Discussed dosing with pharmacy.  -Ask nursing up update image of buttocks today  LOS: 13 days A FACE TO FACE EVALUATION WAS PERFORMED  Shane [name] 01/07/2025, 12:16 PM     "

## 2025-01-07 NOTE — Progress Notes (Signed)
 Occupational Therapy Session Note  Patient Details  Name: Shane [name] MRN: [id number] Date of Birth: 1940-12-11  Today's Date: 01/07/2025 OT Individual Time: 9069-8954 OT Individual Time Calculation (min): 75 min    Short Term Goals: Week 2:  OT Short Term Goal 1 (Week 2): Patient to perform bathing at bed level with Min assist OT Short Term Goal 2 (Week 2): patient will perform toileting with mod A OT Short Term Goal 3 (Week 2): patient will complete LB dressing CGA with LRAD including limb protector  Skilled Therapeutic Interventions/Progress Updates:    Pt resting in w/c upon arrival. Skilled OT intervention with focus on discharge planning, TB transfers, BUE therex for general conditioning/strengthening, and safety awareness to increase independence with BADLs. Pt on 4L O2 throughout session. O2 sats>90% throughout session. CGA for TB transfers after placement of TB. Pt hopes to be able to perform squat pivot transfers before returning home. BUE therex on UBE level 5 4x5 mins in opposite direction with extended rest break. Pt SOB during therex but able to carry on conversation during activity. Pt returned to bed at end of session. Pt required assistance doffing prosthetic. Sit>supine with supervision. Pt remained in bed with all needs within reach. Bed alarm activated.   Therapy Documentation Precautions:  Precautions Precautions: Fall Recall of Precautions/Restrictions: Intact Precaution/Restrictions Comments: watch BP wound VAC, LLE splint Restrictions Weight Bearing Restrictions Per Provider Order: No LLE Weight Bearing Per Provider Order: Non weight bearing   Pain:  Pt denies pain this morning  Therapy/Group: Individual Therapy  Maritza Debby Mare 01/07/2025, 12:03 PM

## 2025-01-07 NOTE — Progress Notes (Signed)
 Occupational Therapy Session Note  Patient Details  Name: Dominic Mahaney MRN: [id number] Date of Birth: 1941/05/31  Today's Date: 01/07/2025 OT Individual Time: 1332-1446 OT Individual Time Calculation (min): 74 min    Short Term Goals: Week 2:  OT Short Term Goal 1 (Week 2): Patient to perform bathing at bed level with Min assist OT Short Term Goal 2 (Week 2): patient will perform toileting with mod A OT Short Term Goal 3 (Week 2): patient will complete LB dressing CGA with LRAD including limb protector  Skilled Therapeutic Interventions/Progress Updates:   Patient agreeable to participate in OT session. Reports 0/10 pain level. Patient participated in skilled OT session focusing on slide board transfer, toileting, AE. Patient received in bed. Completed slide board transfer to [facility]. Completed lateral lean on BSC to increase ability to toilet. Able to educate on bathroom buddy/ toilet tongs. Patient and OT discussed with case worker need for bari [facility] for transfer with slide board and lateral leans. Patient stated would purchase out of pocket if needed. Patient then completed transfer to bed, donning of prosthetic. Patient and MD evaluated recent BKA. Moved down to 3x size in shrinker, patient then completed 4x stand max A to total in parallel bars. Returned to room all needs in reach    Therapy Documentation Precautions:  Precautions Precautions: Fall Recall of Precautions/Restrictions: Intact Precaution/Restrictions Comments: watch BP wound VAC, LLE splint Restrictions Weight Bearing Restrictions Per Provider Order: No LLE Weight Bearing Per Provider Order: Non weight bearing  Therapy/Group: Individual Therapy  [name] Kynadi Dragos 01/07/2025, 3:40 PM

## 2025-01-08 ENCOUNTER — Inpatient Hospital Stay (HOSPITAL_COMMUNITY)

## 2025-01-08 LAB — BASIC METABOLIC PANEL WITH GFR
Anion gap: 6 (ref 5–15)
BUN: 47 mg/dL — ABNORMAL HIGH (ref 8–23)
CO2: 31 mmol/L (ref 22–32)
Calcium: 9 mg/dL (ref 8.9–10.3)
Chloride: 99 mmol/L (ref 98–111)
Creatinine, Ser: 1.7 mg/dL — ABNORMAL HIGH (ref 0.61–1.24)
GFR, Estimated: 40 mL/min — ABNORMAL LOW
Glucose, Bld: 105 mg/dL — ABNORMAL HIGH (ref 70–99)
Potassium: 5.2 mmol/L — ABNORMAL HIGH (ref 3.5–5.1)
Sodium: 136 mmol/L (ref 135–145)

## 2025-01-08 LAB — CBC
HCT: 27.3 % — ABNORMAL LOW (ref 39.0–52.0)
Hemoglobin: 8.7 g/dL — ABNORMAL LOW (ref 13.0–17.0)
MCH: 29.8 pg (ref 26.0–34.0)
MCHC: 31.9 g/dL (ref 30.0–36.0)
MCV: 93.5 fL (ref 80.0–100.0)
Platelets: 215 10*3/uL (ref 150–400)
RBC: 2.92 MIL/uL — ABNORMAL LOW (ref 4.22–5.81)
RDW: 15.6 % — ABNORMAL HIGH (ref 11.5–15.5)
WBC: 6.1 10*3/uL (ref 4.0–10.5)
nRBC: 0.3 % — ABNORMAL HIGH (ref 0.0–0.2)

## 2025-01-08 LAB — GLUCOSE, CAPILLARY
Glucose-Capillary: 103 mg/dL — ABNORMAL HIGH (ref 70–99)
Glucose-Capillary: 120 mg/dL — ABNORMAL HIGH (ref 70–99)
Glucose-Capillary: 122 mg/dL — ABNORMAL HIGH (ref 70–99)
Glucose-Capillary: 239 mg/dL — ABNORMAL HIGH (ref 70–99)

## 2025-01-08 MED ORDER — POLYETHYLENE GLYCOL 3350 17 G PO PACK: 17.0000 g | PACK | Freq: Every day | ORAL | Status: AC

## 2025-01-08 MED ORDER — SENNOSIDES-DOCUSATE SODIUM 8.6-50 MG PO TABS
1.0000 | ORAL_TABLET | Freq: Every day | ORAL | Status: DC
  Administered 2025-01-08 – 2025-01-09 (×2): 1 via ORAL
  Filled 2025-01-08 (×3): qty 1

## 2025-01-08 MED ORDER — SODIUM ZIRCONIUM CYCLOSILICATE 10 G PO PACK
10.0000 g | PACK | Freq: Once | ORAL | Status: AC
  Administered 2025-01-08: 10 g via ORAL
  Filled 2025-01-08: qty 1

## 2025-01-08 MED ORDER — ACETAMINOPHEN 325 MG PO TABS: 325.0000 mg | ORAL_TABLET | Freq: Four times a day (QID) | ORAL | Status: AC | PRN

## 2025-01-08 NOTE — Progress Notes (Signed)
 Physical Therapy Session Note  Patient Details  Name: Shane [name] MRN: [id number] Date of Birth: 04-Dec-1941  Today's Date: 01/08/2025 PT Individual Time: 9153-9073 PT Individual Time Calculation (min): 40 min   Today's Date: 01/08/2025 PT Individual Time: 1432-1530 PT Individual Time Calculation (min): 58 min   Short Term Goals: Week 2:  PT Short Term Goal 1 (Week 2): STG=LTG due to ELOS  Skilled Therapeutic Interventions/Progress Updates:     Treatment 1: Pt semi-reclined in bed upon arrival. Pt denies pain and agreeable to therapy. Session emphasized functional strengthening, endurance/activity tolerance, sit to stands, and dynamic balance with transfers. Pt sat to EOB with S. Pt performed SBT WOB to TIS with CGA and mod A for board placement. Pt transported dependent in TIS to main gym.  bars occupied at time and therefore attempted to perform buttock clearance EOM, however, unsuccessful.  bars became available. Pt practiced standing with R prosthesis using B UE on bars and max A +2. Pt very fatigued after activity. Once back in room, pt positioned for comfort in tilted position in TIS with all needs in reach at end of session.  Treatment 2: Pt seated in TIS upon arrival. Pt denied pain and agreeable to therapy. Pt's wife present at bedside for family training/edu. Session emphasized functional strengthening, endurance/ activity tolerance, transfers, family training and edu, as well as DC planning. Pt performed SBT TIS to EOB and then to electric scooter both with light CGA and VC to avoid bumping L residual limb against scooter. Pt drove scooter down to ortho gym mod I. Pt practiced lateral SBT scooter <> simulated car with SBA and min A for R LE/prosthesis management. Prolonged seated rest breaks required due to fatigue. Pt requires mod A for board placement. PT demonstrated proper slide board placement and pt's wife practiced as well. Explained to pt's wife that pt will need more  physical assist when he is more fatigued though during session pt did not need much physical support. Explained and demonstrated where I would provide assistance if needed in order to prevent sliding forward off board, to prevent improper placement of prosthesis on scooter with transfers, and for managing L residual limb during transfers to minimize knocking into surfaces (scooter, bed, car, etc.). PT provided edu on purpose and wear of limb protector and maintaining knee extension at all times. Pt able to use gait belt to maintain L knee extension while seated in scooter. Once back in room, pt transferred back to EOB with S overall and VC. Pt returned to supine with mod I, bed alarm on, wife at bedside, and all needs in reach at end of session. All questions addressed.  Therapy Documentation Precautions:  Precautions Precautions: Fall Recall of Precautions/Restrictions: Intact Precaution/Restrictions Comments: watch BP wound VAC, LLE splint Restrictions Weight Bearing Restrictions Per Provider Order: Yes LLE Weight Bearing Per Provider Order: Non weight bearing  Therapy/Group: Individual Therapy  Comer CHRISTELLA Levora Comer Levora, PT, DPT 01/08/2025, 7:31 AM

## 2025-01-08 NOTE — Progress Notes (Signed)
 Occupational Therapy Discharge Summary  Patient Details  Name: Shane [name] MRN: [id number] Date of Birth: [birth year]  Date of Discharge from OT service:January 09, 2025  {CHL IP REHAB OT TIME CALCULATIONS:304400400}   Patient has met {NUMBERS 0-12:18577} of {NUMBERS 0-12:18577} long term goals due to {due un:6958348}.  Patient to discharge at overall {LOA:3049010} level.  Patient's care partner {care partner:[id number]} to provide the necessary {assistance:3041652} assistance at discharge.    Reasons goals not met: ***  Recommendation:  Patient will benefit from ongoing skilled OT services in home health setting to continue to advance functional skills in the area of BADL.  Equipment: Slide board  Reasons for discharge: treatment goals met and discharge from hospital  Patient/family agrees with progress made and goals achieved: Yes  OT Discharge Precautions/Restrictions  Precautions Precautions: Fall Recall of Precautions/Restrictions: Intact Restrictions Weight Bearing Restrictions Per Provider Order: Yes LLE Weight Bearing Per Provider Order: Non weight bearing General   Vital Signs   Pain Pain Assessment Pain Scale: 0-10 Pain Score: 0-No pain ADL ADL Eating: Modified independent Grooming: Modified independent Where Assessed-Grooming: Bed level Upper Body Bathing: Modified independent Where Assessed-Upper Body Bathing: Edge of bed Lower Body Bathing: Contact guard Where Assessed-Lower Body Bathing: Bed level Upper Body Dressing: Modified independent (Device) Where Assessed-Upper Body Dressing: Edge of bed Lower Body Dressing: Minimal assistance Where Assessed-Lower Body Dressing: Edge of bed Toileting: Minimal assistance Where Assessed-Toileting: Bed level Toilet Transfer: Minimal assistance Toilet Transfer Method: Scientist, Research (life Sciences): Drop arm bedside commode Tub/Shower Transfer: Unable to assess Film/video Editor: Unable to  assess ADL Comments: ADL's performed at bed level due to patient's decreased endurance and BLE amputation. Vision Baseline Vision/History: 1 Wears glasses Patient Visual Report: No change from baseline Vision Assessment?: No apparent visual deficits Perception  Perception: Within Functional Limits Praxis Praxis: WFL Cognition Cognition Overall Cognitive Status: Within Functional Limits for tasks assessed Arousal/Alertness: Awake/alert Orientation Level: Person;Place Memory: Appears intact Problem Solving: Appears intact Safety/Judgment: Appears intact Brief Interview for Mental Status (BIMS) Repetition of Three Words (First Attempt): 3 Temporal Orientation: Year: Correct Temporal Orientation: Month: Accurate within 5 days Temporal Orientation: Day: Correct Recall: Sock: Yes, no cue required Recall: Blue: Yes, no cue required Recall: Bed: Yes, after cueing (a piece of furniture) BIMS Summary Score: 14 Sensation Sensation Light Touch: Impaired by gross assessment Hot/Cold: Appears Intact Proprioception: Appears Intact Stereognosis: Impaired by gross assessment Additional Comments: Neuropathy in B hands, impaired LT sensation B LE Coordination Gross Motor Movements are Fluid and Coordinated: No Fine Motor Movements are Fluid and Coordinated: Yes Motor  Motor Motor: Abnormal postural alignment and control Mobility  Bed Mobility Bed Mobility: Rolling Right;Rolling Left;Supine to Sit;Sit to Supine Rolling Right: Supervision/verbal cueing Rolling Left: Supervision/Verbal cueing Left Sidelying to Sit: Supervision/Verbal cueing Supine to Sit: Supervision/Verbal cueing Sit to Supine: Supervision/Verbal cueing  Trunk/Postural Assessment  Cervical Assessment Cervical Assessment: Exceptions to [facility] Thoracic Assessment Thoracic Assessment: Exceptions to [facility] Lumbar Assessment Lumbar Assessment: Exceptions to [facility] Postural Control Postural Control: Deficits on  evaluation Trunk Control: patient with balance deficits with no LE support Righting Reactions: slow UE protective response  Balance Balance Balance Assessed: Yes Static Sitting Balance Static Sitting - Balance Support: Bilateral upper extremity supported Static Sitting - Level of Assistance: 6: Modified independent (Device/Increase time) Dynamic Sitting Balance Dynamic Sitting - Level of Assistance: 5: Stand by assistance Extremity/Trunk Assessment RUE Assessment RUE Assessment: Within Functional Limits LUE Assessment LUE Assessment: Within Functional Limits   [name] Damian Hofstra  01/08/2025, 3:43 PM

## 2025-01-08 NOTE — Progress Notes (Signed)
 Occupational Therapy Weekly Progress Note  Patient Details  Name: Shane [name] MRN: [id number] Date of Birth: [birth year]  Beginning of progress report period: January 02, 2025 End of progress report period: January 08, 2025   Session 1: Today's Date: 01/08/2025 OT Individual Time: 8898-8796 OT Individual Time Calculation (min): 62 min   Session 2: Today's Date: 01/08/2025 OT Individual Time: 8699-8640 OT Individual Time Calculation (min): 59 min   Patient has met 3 of 3 short term goals.  Patient demonstrates significant improvement with balance, activity tolerance, and compensating for deficits. Patient completed caregiver training today.   Patient continues to demonstrate the following deficits: muscle weakness, decreased cardiorespiratoy endurance, and decreased sitting balance, decreased standing balance, and B/L BKA limiting mobility  and therefore will continue to benefit from skilled OT intervention to enhance overall performance with BADL and Reduce care partner burden.  Patient progressing toward long term goals..  Continue plan of care.  OT Short Term Goals Week 3:  OT Short Term Goal 1 (Week 3): STG=LTG d/t ELOS   Session 1: Skilled Therapeutic Interventions/Progress Updates:  Patient agreeable to participate in OT session. Reports 0/10 pain level.   Patient participated in skilled OT session focusing on standing balance/ LE strengthening, LB dressing training, activity tolerance. Patient received in wc ready for OT. Patient transported to therapy gym. Patient completed sit to stand with 10 second stands x6 to increase LE strength related to transfers via slide board, and activity tolerance. Patient then able to complete able to complete UBE on level 3, 9 minutes. Patient then able to complete slide board transfer to mat table, simulated LB clothes donning after toileting with theraband. Patient then able to complete slide board transfer back to chair, returned to room all   needs in reach.    Session 2: Skilled Therapeutic Interventions/Progress Updates:    Completed family training with patient spouse. Therapist educated family regarding slide board transfer, limb protector, mobility, toileting. . Education also provided on strategies and compensatory techniques to use if patient required extra assistance. Education provided for gait belt use and handling technique. Patient spouse  completed hands on training for toileting and slide board transfer with therapist providing VC as needed for technique and form. All education completed and all questions  . Patient left in chair with spouse all needs in reach.    Therapy Documentation Precautions:  Precautions Precautions: Fall Recall of Precautions/Restrictions: Intact Precaution/Restrictions Comments: watch BP wound VAC, LLE splint Restrictions Weight Bearing Restrictions Per Provider Order: Yes LLE Weight Bearing Per Provider Order: Non weight bearing  Therapy/Group: Individual Therapy  [name] Trevion Hoben 01/08/2025, 6:58 AM

## 2025-01-08 NOTE — Progress Notes (Addendum)
 "                                                        PROGRESS NOTE   Subjective/Complaints: Reports no BM yesterday, last one recorded was 2/2.  Pain is under control.    ROS: Patient denies chills, dizziness, nausea, vomiting, diarrhea,  shortness of breath or chest pain, headache, or mood change.  + Phantom pain-intermittent-overall controlled + buttocks pain- improved + indigestion- improved + Constipation - mild  Objective:   No results found.  Recent Labs    01/06/25 0452 01/08/25 0439  WBC 6.5 6.1  HGB 8.5* 8.7*  HCT 27.1* 27.3*  PLT 206 215    Recent Labs    01/07/25 0545 01/08/25 0439  NA 135 136  K 5.5* 5.2*  CL 98 99  CO2 32 31  GLUCOSE 106* 105*  BUN 46* 47*  CREATININE 1.96* 1.70*  CALCIUM  8.9 9.0    Intake/Output Summary (Last 24 hours) at 01/08/2025 0955 Last data filed at 01/08/2025 0700 Gross per 24 hour  Intake 1160 ml  Output 1675 ml  Net -515 ml        Physical Exam: Vital Signs Blood pressure (!) 123/57, pulse 64, temperature 98.4 F (36.9 C), temperature source Oral, resp. rate 16, height 5' (1.524 m), weight 99.6 kg, SpO2 100%.  Constitutional: No distress . Vital signs reviewed.  Appears comfortable lying in bed. HEENT: NCAT, EOMI, oral membranes moist Neck: supple Cardiovascular: RRR without murmur. No JVD    Respiratory/Chest: CTA Bilaterally without wheezes or rales. Normal effort. O2 4L,   GI/Abdomen: BS +, non-tender, non-distended, soft Ext: Bilateral BKA Psych: pleasant and cooperative  Right BKA well-healed. Left BKA in shrinker with dry dressing and limb protector.  Leg is well formed with residual edema. Sl tender to palpation BKA incision BKA incision overall intact, only slight non-purulent drainage noted  Skin: bruising noted on Ues, warm and dry Neuro:   Alert and awake, follows simple commands, CN 2-12 grossly intact, moving all 4 extremities to gravity.   Prior neuro assessment is c/w today's exam  01/08/2025.      Assessment/Plan: 1. Functional deficits which require 3+ hours per day of interdisciplinary therapy in a comprehensive inpatient rehab setting. Physiatrist is providing close team supervision and 24 hour management of active medical problems listed below. Physiatrist and rehab team continue to assess barriers to discharge/monitor patient progress toward functional and medical goals  Care Tool:  Bathing    Body parts bathed by patient: Right arm, Left arm, Chest, Abdomen, Face, Front perineal area, Right upper leg, Left upper leg, Right lower leg, Left lower leg   Body parts bathed by helper: Buttocks Body parts n/a: Left lower leg   Bathing assist Assist Level: Moderate Assistance - Patient 50 - 74%     Upper Body Dressing/Undressing Upper body dressing   What is the patient wearing?: Pull over shirt    Upper body assist Assist Level: Set up assist    Lower Body Dressing/Undressing Lower body dressing      What is the patient wearing?: Pants, Ace wrap/stump shrinker     Lower body assist Assist for lower body dressing: Minimal Assistance - Patient > 75%     Toileting Toileting    Toileting assist Assist for toileting: Maximal Assistance -  Patient 25 - 49%     Transfers Chair/bed transfer  Transfers assist  Chair/bed transfer activity did not occur: Safety/medical concerns (weakness/fatigue/pain/nausea)  Chair/bed transfer assist level: Contact Guard/Touching assist     Locomotion Ambulation   Ambulation assist   Ambulation activity did not occur: Safety/medical concerns (B BKA)          Walk 10 feet activity   Assist  Walk 10 feet activity did not occur: Safety/medical concerns (B BKA)        Walk 50 feet activity   Assist Walk 50 feet with 2 turns activity did not occur: Safety/medical concerns (B BKA)         Walk 150 feet activity   Assist Walk 150 feet activity did not occur: Safety/medical concerns (B BKA)          Walk 10 feet on uneven surface  activity   Assist Walk 10 feet on uneven surfaces activity did not occur: Safety/medical concerns (B BKA)         Wheelchair     Assist Is the patient using a wheelchair?: Yes Type of Wheelchair: Manual Wheelchair activity did not occur: Safety/medical concerns (weakness/fatigue/pain/nausea)  Wheelchair assist level: Dependent - Patient 0%      Wheelchair 50 feet with 2 turns activity    Assist    Wheelchair 50 feet with 2 turns activity did not occur: Safety/medical concerns (weakness/fatigue/pain/nausea)   Assist Level: Dependent - Patient 0%   Wheelchair 150 feet activity     Assist  Wheelchair 150 feet activity did not occur: Safety/medical concerns (weakness/fatigue/pain/nausea)   Assist Level: Dependent - Patient 0%   Blood pressure (!) 123/57, pulse 64, temperature 98.4 F (36.9 C), temperature source Oral, resp. rate 16, height 5' (1.524 m), weight 99.6 kg, SpO2 100%.  Medical Problem List and Plan: 1. 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  Antithrombotics: -DVT/anticoagulation:  Pharmaceutical: Eliquis              -antiplatelet therapy: N/A  3. Pain: continue Neurontin  100 mg nightly, oxycodone  as needed, Robaxin  as needed  - 1/24 continue as needed oxycodone  for pain control  -1/28 increase gabapentin  to 100 mg 3 times daily for phantom pain 4. Mood/Behavior/Sleep: Rozerem  8 mg nightly, trazodone  75 mg nightly as needed             -antipsychotic agents: N/A  -1/28 continue current  dose trazodone  at night for insomnia, could consider trying higher dose of insomnia continue  5. Neuropsych/cognition: This patient is not capable of making decisions on his own behalf.  6. Skin/Wound Care: continue dry dressing and shrinker to left BKA  7. Fluids/Electrolytes/Nutrition: Routine and analysis with follow-up chemistries  8.  History of right BKA 2022.  Patient received CIR.  Intermittent use of prosthesis  9.  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  CRRT 1/10 - 1/12 transition to intermittent hemodialysis.  Follow-up renal services  -1/22-23  reviewed nephrology note, monitoring for need for dialysis  -1/26 labs appear relatively stable  -1/27 BUN and creatinine stable at 58/2.35, discussed with nephrology they plan to sign off.  If renal function does not completely recover he can be referred to outpatient f/u with [facility].  Dialysis catheter to be discontinued -order placed by nephrology .  -1/28 BUN and creatinine slightly higher at 65/2.51 continue to monitor  -2/4 BUN cand Cr stable at 1.70 / 47  10.  Paroxysmal atrial fibrillation.  Continue Eliquis .    - 1/24 patient did have vomiting after Eliquis , discussed with pharmacy we will hold off on redosing  - Heart rate stable    01/08/2025    4:41 AM 01/07/2025    7:33 PM 01/07/2025    3:00 PM  Vitals with BMI  Weight 219 lbs 9 oz    BMI 42.88    Systolic 123 114 876  Diastolic 57 62 56  Pulse 64 61 66    11.  Acute on chronic anemia.  Follow-up CBC.  Continue iron  supplement  - 1/25 Hemoglobin lower at 7.8, recheck tomorrow   -1/26- hgb with further dip to 7.5, likely multifactorial, normal MCV   -will check one stool for OB  1/27 hemoglobin back up to 7.8, stool OB pending.  Will remind nurses to check with next bowel movement  -1/28 IB stool positive, will contact GI- GI consulted  - 1/20 9 GI following appreciate assistance, hemoglobin today 8.2.  GI following, recommends transfusing for level less  than 8 of hemoglobin.  GI not planning endoscopic evaluation unless there are additional changes.  -Will see if he is agreeble to transfusion today for HGB < 8.0-  Addendum- Blood transfusion was ordered  2/4 HGB stable at 8.7  12.  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  Continue NovoLog  4 units every 4 hours, Lantus  insulin  18 units twice daily. -1/22 had mild hypoglycemia yesterday, decrease novolog  to 2 units Q4h -1/29 hypoglycemia, will decrease insulin  scale, also decrease lantus  to 15u BID -1/30 monitor response to medication change -2/3 Decrease lantus  to 14u BID -2/4 continue current regimen CBG (last 3)  Recent Labs    01/07/25 1709 01/07/25 2057 01/08/25 0547  GLUCAP 118* 164* 103*    13.  Hypotension.  ProAmatine  5 mg every 8 hours.  Monitor with increased mobility  - 1/23-2/4 BP soft but overall stable continue to monitor    01/08/2025    4:41 AM 01/07/2025    7:33 PM 01/07/2025    3:00 PM  Vitals with BMI  Weight 219 lbs 9 oz    BMI 42.88    Systolic 123 114 876  Diastolic 57 62 56  Pulse 64 61 66    14.  Chronic systolic congestive heart failure.  Echocardiogram with ejection fraction of 30 to 35%.  Monitor for any signs of fluid overload. Daily weights -1/23 no signs of overload noted, difficult to assess weight trend suspect inaccurate readings -1/24 does not appear to have signs of fluid overload, dialysis today per nephrology -2/4 Wt trending up but not signs of fluid overload noted, continue to monitor    Filed Weights   01/06/25 0447 01/07/25 0500 01/08/25 0441  Weight: 97.5 kg 99.2 kg 99.6 kg     15.  CAD/AV block/Mobitz 1/aortic stenosis/CABG 2021.  Follow-up cardiology services. 16.  Idiopathic pulmonary fibrosis/OSA.  Followed by Dr. Geronimo.  Patient with 2 L oxygen intermittently at home.  Currently  not on CPAP until tube feeds discontinued  - 1/25 consider restarting CPAP if NG tube removed, breathing treatments  helping   1/26 resume CPAP today  1/27 patient was ordered for CPAP, he forgot to start yesterday plans to use tonight  1/30 patient reports tolerating CPAP okay, continue  17.  Oropharyngeal dysphagia.  Follow-up speech therapy.  Currently with Cortrak feeding tube for nutritional support.  Advance diet as tolerated. Currently NPO with ice chips allowed  - 1/23 diet was upgraded today to D3 thin by SLP  -1/25 will hold tube feedings(pt was declining also), patient would like to try eating orally especially now that nausea is improved.  Monitor intake could consider removing tube tomorrow if doing well.   1/26 eating well enough. Dc TF. Pt aware that he needs to push po intake.   1/30-eating majority of his meals, continue to monitor  18. Diarrhea/constipation.  Related to tube feeds.  Flexi-Seal in place: continue Continue Imodium .  -1/23 LBM 1/21, improved continue to follow-up  -1/25 LBM  --- improved overall  2/3 pt reports feeling a little constipated. Add miralax  and PRN sorbtiol. LBM 2/2  2/4 will add senokot HS   19.  GERD: continue Protonix   -2/3 tums PRN   20.  Hyperlipidemia: continue Lipitor   21. Itching.  -continue atarax  PRN  -sarna PRN  22.  Hyperkalemia, hyponatremia -1/23 nephrology following has ordered Lokelma , appreciate assistance Improved -1/27 potassium elevated today, nephrology ordered Lokelma  daily, continue to monitor trend Diet adjusted to low K 1/29 potassium stable at 4.9 2/1: K+ 5.5, additional 5g Lokelma  ordered 2/2 K+ down to 5.2, will give additional 5g lokelma  again today 2/3 K+ a little higher today, discuss pharmacy, will give 10g for 3 doses today. Low K diet 2/4 K+ down to 5.2, additional dose of lokelma  10mg  today, if continues to be elevated may contact nephrology 2/4 discussed with nephrology- hold off on additional lokelma  unless above 5.5.  Monitor for constipation, check bladder scans  23. Nausea  -hold tube feeds for now, nephrology  planning dialysis today as this may be potential cause.  Abdominal x-ray ordered-no obstruction/ileus    -1/25-27 nausea has improved, continue current regimen  -1/28 continues to have intermittent nausea, will ask pharmacy to spread out medication   -improved  24.  Buttocks fungal rash.  -Nystatin  powder, Gerhart's as needed.  Consider  adding oral fluconazole  (discussed with cardiology yesterday - that should be okay if needed.)  - 1/30 will asked nursing to get updated image, will add oral fluconazole .  Discussed dosing with pharmacy.  -Rash has improved, continue to continue topical treatment  LOS: 14 days A FACE TO FACE EVALUATION WAS PERFORMED  Shane [name] 01/08/2025, 9:55 AM     "

## 2025-01-08 NOTE — Progress Notes (Signed)
 Patient ID: Shane [name], male   DOB: [birth year], 84 y.o.   MRN: [id number]  Have reviewed team conference with pt and family. Should toilet transfers go well and the patient's wife, Avelina is able to manage in the home, discharge may be set to 02/06. If not, 02/09 will be the anticipated discharge date.   Avelina will be in for family education today for sessions occurring between 1pm-4pm.   Will provide updates as necessary.

## 2025-01-08 NOTE — Patient Care Conference (Signed)
 Inpatient RehabilitationTeam Conference and Plan of Care Update Date: 01/08/2025   Time: 9:54 AM    Patient Name: Shane [name]      Medical Record Number: [id number]  Date of Birth: Feb 27, 1941 Sex: Male         Room/Bed: 4W05C/4W05C-01 Payor Info: Payor: [employer] MEDICARE / Plan: [facility] MEDICARE / Product Type: *No Product type* /    Admit Date/Time:  12/25/2024  6:34 PM  Primary Diagnosis:  Left below-knee amputee [facility])  Hospital Problems: Principal Problem:   Left below-knee amputee [facility]) Active Problems:   History of colonic polyps   Chronic anticoagulation   History of dysphagia   Normocytic anemia    Expected Discharge Date: Expected Discharge Date: 01/10/25  Team Members Present: Physician leading conference: Dr. Murray Collier Social Worker Present: Waverly Gentry, LCSW-A Nurse Present: Barnie Ronde, RN PT Present: Izetta Seip, PT OT Present: Michaelyn Seip, OT PPS Coordinator present : Eleanor Colon, SLP     Current Status/Progress Goal Weekly Team Focus  Bowel/Bladder   pt is continent of B&B. last bm 2/2.   to remain continent of bowel and bladder.   use call light for assistance and to empty urinal    Swallow/Nutrition/ Hydration               ADL's   toileting max, LB dressing, UB dressing CG, poor lateral lean, dynamic balance.   min A to CGA   2/9??- depending on toileting. biggest barrier to D/C. awaiting family ed    Mobility   S/++ time for bed mob, CGA for slide board transfers (min A at times due to fatigue)   Supervision to min A  Endurance/activity tolerance, transfers, strengthening, DC planning; DME - slide board; HH PT    Communication                Safety/Cognition/ Behavioral Observations               Pain   pt c/o pain at surgical site for R BKA. 2.5 mg prn oxy given at hs.   pain to be managed with prn oxy and robaxin  to keep below level of 3/10.[name]   pt to remain free of pain or less than 3/10.    Skin    skin wnl except for new R BKA surgical site, which is clean and dry   surgical site to remain clean and free of infection.  pt will turn and reposition q 2 hours for comfort.      Discharge Planning:  Home with wife and grandson. Has most DME. Ordered long slide-board and wil lestablish HH PT/OT.   Team Discussion: Patient admitted post left BKA with old right BKA,. Limited by poor core strength, fatigue,  and poor endurance.  Patient on target to meet rehab goals: yes, currently needs CGA for bathing and dressing and max assist for toileting using adaptive equipment.  Needs supervision for bed mobility but CGA for slide - board transfers.  Goals for discharge set for supervision - min assist overall.  *See Care Plan and progress notes for long and short-term goals.   Revisions to Treatment Plan:  Lokelma  for hyperkalemia   Teaching Needs: Safety, medications, transfers, toileting, etc.   Current Barriers to Discharge: Decreased caregiver support and Home enviroment access/layout  Possible Resolutions to Barriers: Family education Slide board     Medical Summary Current Status: BKA, AKI/CKD, anemia, fungal rash, pulmonary fibrosis, CHF  Barriers to Discharge: Cardiac Complications;Medical stability;Hypotension;Self-care education  Barriers to Discharge Comments: BKA, AKI/CKD, anemia, fungal rash, pulmonary fibrosis, CHF Possible Resolutions to [employer] Focus: adjust lokelma  dose,  antifungals, monitor fluid status, nasal O2, monitor BUN/Cr   Continued Need for Acute Rehabilitation Level of Care: The patient requires daily medical management by a physician with specialized training in physical medicine and rehabilitation for the following reasons: Direction of a multidisciplinary physical rehabilitation program to maximize functional independence : Yes Medical management of patient stability for increased activity during participation in an intensive rehabilitation  regime.: Yes Analysis of laboratory values and/or radiology reports with any subsequent need for medication adjustment and/or medical intervention. : Yes   I attest that I was present, lead the team conference, and concur with the assessment and plan of the team.   Fredericka Sober B 01/08/2025, 1:36 PM

## 2025-01-08 NOTE — Plan of Care (Signed)
" °  Problem: RH BOWEL ELIMINATION Goal: RH STG MANAGE BOWEL WITH ASSISTANCE Description: STG Manage Bowel with min Assistance. Outcome: Progressing   Problem: RH BLADDER ELIMINATION Goal: RH STG MANAGE BLADDER WITH ASSISTANCE Description: STG Manage Bladder With min  Assistance Outcome: Progressing   Problem: RH SKIN INTEGRITY Goal: RH STG SKIN FREE OF INFECTION/BREAKDOWN Description: Manage skin free of infection with min assistance Outcome: Progressing   Problem: RH SAFETY Goal: RH STG ADHERE TO SAFETY PRECAUTIONS W/ASSISTANCE/DEVICE Description: STG Adhere to Safety Precautions With min Assistance/Device. Outcome: Progressing   Problem: RH PAIN MANAGEMENT Goal: RH STG PAIN MANAGED AT OR BELOW PT'S PAIN GOAL Description: <4 w/ prns Outcome: Progressing   Problem: RH KNOWLEDGE DEFICIT GENERAL Goal: RH STG INCREASE KNOWLEDGE OF SELF CARE AFTER HOSPITALIZATION Description: Manage knowledge of self care after hospitalization with min assistance from spouse using educational materials provided  Outcome: Progressing   "

## 2025-01-08 NOTE — Plan of Care (Signed)
 Adjusted location of LB bathing due to positioning and deficits  Problem: RH Bathing Goal: LTG Patient will bathe all body parts with assist levels (OT) Description: LTG: Patient will bathe all body parts with assist levels (OT) Flowsheets Taken 01/08/2025 1305 by [name], [name], OT LTG: Position pt will perform bathing: Edge of bed Taken 12/26/2024 1259 by [name], OT LTG: Pt will perform bathing with assistance level/cueing: Contact Guard/Touching assist

## 2025-01-09 ENCOUNTER — Other Ambulatory Visit (HOSPITAL_COMMUNITY): Payer: Self-pay

## 2025-01-09 LAB — CBC
HCT: 27.4 % — ABNORMAL LOW (ref 39.0–52.0)
Hemoglobin: 8.7 g/dL — ABNORMAL LOW (ref 13.0–17.0)
MCH: 30.1 pg (ref 26.0–34.0)
MCHC: 31.8 g/dL (ref 30.0–36.0)
MCV: 94.8 fL (ref 80.0–100.0)
Platelets: 186 10*3/uL (ref 150–400)
RBC: 2.89 MIL/uL — ABNORMAL LOW (ref 4.22–5.81)
RDW: 15.8 % — ABNORMAL HIGH (ref 11.5–15.5)
WBC: 6.1 10*3/uL (ref 4.0–10.5)
nRBC: 0 % (ref 0.0–0.2)

## 2025-01-09 LAB — BASIC METABOLIC PANEL WITH GFR
Anion gap: 8 (ref 5–15)
BUN: 42 mg/dL — ABNORMAL HIGH (ref 8–23)
CO2: 29 mmol/L (ref 22–32)
Calcium: 8.9 mg/dL (ref 8.9–10.3)
Chloride: 98 mmol/L (ref 98–111)
Creatinine, Ser: 1.81 mg/dL — ABNORMAL HIGH (ref 0.61–1.24)
GFR, Estimated: 37 mL/min — ABNORMAL LOW
Glucose, Bld: 116 mg/dL — ABNORMAL HIGH (ref 70–99)
Potassium: 5.1 mmol/L (ref 3.5–5.1)
Sodium: 136 mmol/L (ref 135–145)

## 2025-01-09 LAB — GLUCOSE, CAPILLARY
Glucose-Capillary: 107 mg/dL — ABNORMAL HIGH (ref 70–99)
Glucose-Capillary: 117 mg/dL — ABNORMAL HIGH (ref 70–99)
Glucose-Capillary: 175 mg/dL — ABNORMAL HIGH (ref 70–99)
Glucose-Capillary: 139 mg/dL — ABNORMAL HIGH (ref 70–99)

## 2025-01-09 MED ORDER — SORBITOL 70 % SOLN: 30.0000 mL | Freq: Once | Status: DC

## 2025-01-09 MED ORDER — TRAZODONE HCL 150 MG PO TABS
75.0000 mg | ORAL_TABLET | Freq: Every evening | ORAL | 0 refills | Status: AC | PRN
  Filled 2025-01-09: qty 30, 60d supply, fill #0

## 2025-01-09 MED ORDER — OXYCODONE HCL 5 MG PO TABS
2.5000 mg | ORAL_TABLET | ORAL | 0 refills | Status: AC | PRN
  Filled 2025-01-09: qty 30, 10d supply, fill #0

## 2025-01-09 MED ORDER — APIXABAN 2.5 MG PO TABS
2.5000 mg | ORAL_TABLET | Freq: Two times a day (BID) | ORAL | 0 refills | Status: AC
  Filled 2025-01-09: qty 60, 30d supply, fill #0

## 2025-01-09 MED ORDER — HYDROXYZINE HCL 25 MG PO TABS
25.0000 mg | ORAL_TABLET | Freq: Three times a day (TID) | ORAL | 0 refills | Status: AC | PRN
  Filled 2025-01-09: qty 30, 10d supply, fill #0

## 2025-01-09 MED ORDER — GABAPENTIN 100 MG PO CAPS
100.0000 mg | ORAL_CAPSULE | Freq: Three times a day (TID) | ORAL | 0 refills | Status: AC
  Filled 2025-01-09: qty 90, 30d supply, fill #0

## 2025-01-09 MED ORDER — FERROUS SULFATE 325 (65 FE) MG PO TABS
325.0000 mg | ORAL_TABLET | ORAL | 0 refills | Status: AC
  Filled 2025-01-09: qty 30, 60d supply, fill #0

## 2025-01-09 MED ORDER — DEXTROMETHORPHAN POLISTIREX ER 30 MG/5ML PO SUER
15.0000 mg | Freq: Two times a day (BID) | ORAL | 0 refills | Status: AC
  Filled 2025-01-09: qty 89, 18d supply, fill #0

## 2025-01-09 MED ORDER — GERHARDT'S BUTT CREAM
1.0000 | TOPICAL_CREAM | Freq: Three times a day (TID) | CUTANEOUS | 0 refills | Status: AC | PRN
  Filled 2025-01-09: qty 60, 20d supply, fill #0

## 2025-01-09 MED ORDER — ATORVASTATIN CALCIUM 80 MG PO TABS
80.0000 mg | ORAL_TABLET | Freq: Every day | ORAL | 0 refills | Status: AC
  Filled 2025-01-09: qty 30, 30d supply, fill #0

## 2025-01-09 MED ORDER — TAMSULOSIN HCL 0.4 MG PO CAPS
0.4000 mg | ORAL_CAPSULE | Freq: Every day | ORAL | 0 refills | Status: AC
  Filled 2025-01-09: qty 30, 30d supply, fill #0

## 2025-01-09 MED ORDER — ASCORBIC ACID 1000 MG PO TABS
1000.0000 mg | ORAL_TABLET | Freq: Every day | ORAL | 0 refills | Status: AC
  Filled 2025-01-09: qty 30, 30d supply, fill #0

## 2025-01-09 MED ORDER — RAMELTEON 8 MG PO TABS
8.0000 mg | ORAL_TABLET | Freq: Every day | ORAL | 0 refills | Status: AC
  Filled 2025-01-09: qty 30, 30d supply, fill #0

## 2025-01-09 MED ORDER — METHOCARBAMOL 500 MG PO TABS
500.0000 mg | ORAL_TABLET | Freq: Three times a day (TID) | ORAL | 0 refills | Status: AC | PRN
  Filled 2025-01-09: qty 60, 20d supply, fill #0

## 2025-01-09 MED ORDER — PANTOPRAZOLE SODIUM 40 MG PO TBEC
40.0000 mg | DELAYED_RELEASE_TABLET | Freq: Two times a day (BID) | ORAL | 0 refills | Status: AC
  Filled 2025-01-09: qty 60, 30d supply, fill #0

## 2025-01-09 MED ORDER — ESCITALOPRAM OXALATE 10 MG PO TABS
10.0000 mg | ORAL_TABLET | Freq: Every day | ORAL | 0 refills | Status: AC
  Filled 2025-01-09: qty 30, 30d supply, fill #0

## 2025-01-09 MED ORDER — MIDODRINE HCL 5 MG PO TABS
5.0000 mg | ORAL_TABLET | Freq: Three times a day (TID) | ORAL | 0 refills | Status: AC
  Filled 2025-01-09: qty 90, 30d supply, fill #0

## 2025-01-09 MED ORDER — SODIUM ZIRCONIUM CYCLOSILICATE 10 G PO PACK
10.0000 g | PACK | Freq: Every day | ORAL | 0 refills | Status: AC
  Filled 2025-01-09: qty 30, 30d supply, fill #0

## 2025-01-09 NOTE — Progress Notes (Signed)
 Group Therapy Cancellation Note  Patient Details  Name: Shane [name] MRN: [id number] Date of Birth: [birth year] Today's Date: 01/09/2025   Pt missed 60 minutes of group therapy session due to conflict with scheduling/transport. Will attempt to make up missed minutes as able.    Therisa HERO Zaunegger Therisa Stains PT, DPT 01/09/2025, 12:23 PM

## 2025-01-09 NOTE — Progress Notes (Signed)
 Physical Therapy Discharge Summary  Patient Details  Name: Shane [name] MRN: [id number] Date of Birth: 02-05-1941  Date of Discharge from PT service:January 09, 2025   Patient has met 6 of 6 long term goals due to improved activity tolerance, improved balance, improved postural control, increased strength, decreased pain, and ability to compensate for deficits.  Patient to discharge at a wheelchair level supervision.   Patient's care partner is independent to provide the necessary physical assistance at discharge.  Recommendation:  Patient will benefit from ongoing skilled PT services in home health setting to continue to advance safe functional mobility, address ongoing impairments in strength, transfers, balance, and minimize fall risk.  Equipment: Slide board  Reasons for discharge: treatment goals met and discharge from hospital  Patient/family agrees with progress made and goals achieved: Yes  PT Discharge Precautions/Restrictions Precautions Precautions: Fall Recall of Precautions/Restrictions: Intact Precaution/Restrictions Comments: Limb protector L LE when OOB Restrictions Weight Bearing Restrictions Per Provider Order: Yes LLE Weight Bearing Per Provider Order: Non weight bearing Pain Interference Pain Interference Pain Effect on Sleep: 2. Occasionally Pain Interference with Therapy Activities: 1. Rarely or not at all Pain Interference with Day-to-Day Activities: 2. Occasionally Vision/Perception  Vision - History Ability to See in Adequate Light: 0 Adequate Perception Perception: Within Functional Limits Praxis Praxis: WFL  Cognition Overall Cognitive Status: Within Functional Limits for tasks assessed Arousal/Alertness: Awake/alert Attention: Sustained Sustained Attention: Appears intact Memory: Appears intact Awareness: Appears intact Problem Solving: Appears intact Safety/Judgment: Appears intact Sensation Sensation Light Touch: Impaired by gross  assessment Proprioception: Appears Intact Coordination Gross Motor Movements are Fluid and Coordinated: No Motor  Motor Motor: Abnormal postural alignment and control Motor - Discharge Observations: Generalized weakness though improved since eval  Mobility Bed Mobility Bed Mobility: Rolling Right;Rolling Left;Supine to Sit;Sit to Supine Rolling Right: Independent with assistive device Rolling Left: Independent with assistive device Supine to Sit: Supervision/Verbal cueing Sit to Supine: Supervision/Verbal cueing Transfers Transfers: Lateral/Scoot Transfers Lateral/Scoot Transfers: Supervision/Verbal cueing;Contact Guard/Touching assist Transfer (Assistive device): Other (Comment) (slide board) Locomotion  Gait Ambulation: No Gait Gait: No Stairs / Additional Locomotion Stairs: No Pick up small object from the floor (from standing position) activity did not occur: Safety/medical concerns (B BKA) Wheelchair Mobility Wheelchair Mobility: Yes Wheelchair Assistance: Independent with Scientist, Research (life Sciences): Power (scooter) Wheelchair Parts Management: Needs assistance Distance: >200 ft  Trunk/Postural Assessment  Cervical Assessment Cervical Assessment: Exceptions to [facility] Thoracic Assessment Thoracic Assessment: Exceptions to [facility] Lumbar Assessment Lumbar Assessment: Exceptions to [facility] Postural Control Postural Control: Deficits on evaluation Trunk Control: patient with balance deficits with no LE support Righting Reactions: slow UE protective response  Balance Balance Balance Assessed: Yes Static Sitting Balance Static Sitting - Balance Support: Bilateral upper extremity supported Static Sitting - Level of Assistance: 6: Modified independent (Device/Increase time) Dynamic Sitting Balance Dynamic Sitting - Balance Support: Right upper extremity supported;Left upper extremity supported Dynamic Sitting - Level of Assistance: 5: Stand by assistance Extremity  Assessment  RLE Assessment RLE Assessment: Exceptions to [facility] General Strength Comments: Hip/knee grossly 4-/5 LLE Assessment LLE Assessment: Exceptions to [facility] General Strength Comments: Hip/knee grossly 3+/5   Comer CHRISTELLA Levora Comer Levora, PT, DPT 01/09/2025, 3:56 PM

## 2025-01-09 NOTE — Progress Notes (Incomplete)
 Inpatient Rehabilitation Discharge Medication Review by a Pharmacist  A complete drug regimen review was completed for this patient to identify any potential clinically significant medication issues.  High Risk Drug Classes Is patient taking? Indication by Medication  Antipsychotic No   Anticoagulant Yes Apixaban  - a fib  Antibiotic No   Opioid Yes Oxycodone  - pain  Antiplatelet No   Hypoglycemics/insulin  Yes Insulin  - DM  Vasoactive Medication Yes Midodrine  - blood pressure support Tamsulosin - BPH   Chemotherapy No   Other Yes APAP- pain  Lokelma - hyperkalemia  Atorvastatin  - HLD Gabapentin  - pain Protonix  - GERD Delsym - cough  Hydroxyzine  - anxiety Robaxin - spasms  PEG- constipation  Iron , VitC, and MVI - supplementation Trazodone , ramelteon - insomnia  Escitalopram - mood     Type of Medication Issue Identified Description of Issue Recommendation(s)  Drug Interaction(s) (clinically significant)     Duplicate Therapy     Allergy     No Medication Administration End Date     Incorrect Dose     Additional Drug Therapy Needed     Significant med changes from prior encounter (inform family/care partners about these prior to discharge). Apixaban  home dose 5 mg bid; due to renal fxn changed to 2.5 mg bid  Lasix , lisinopril - discontinued from PTA   Ativan  - on PTA Apixaban  - inform family of dose change Ativan  - assess need to restart versus discontinue from  home meds  Other       Clinically significant medication issues were identified that warrant physician communication and completion of prescribed/recommended actions by midnight of the next day:  No  Name of provider notified for urgent issues identified: Rolan Pitch, PA   Provider Method of Notification: phone call   Pharmacist comments: Discussed glargine discontinuation from inpatient. Per discussion with PA, pt has been told to resume his home Tresiba  regimen and this information has been added to the AVS.  Pt understands the plan per provider.    Time spent performing this drug regimen review (minutes):  20

## 2025-01-09 NOTE — Progress Notes (Signed)
 Physical Therapy Session Note  Patient Details  Name: Garvey Westcott MRN: [id number] Date of Birth: 1941-01-11  Today's Date: 01/09/2025 PT Individual Time: 1300-1415 PT Individual Time Calculation (min): 75 min   Short Term Goals: Week 2:  PT Short Term Goal 1 (Week 2): STG=LTG due to ELOS  Skilled Therapeutic Interventions/Progress Updates:     Pt seated in TIS upon arrival. Pt denies pain, agreeable to therapy, and requests to use BSC. Session emphasized functional strengthening, endurance/activity tolerance, and dynamic balance with transfers. Pt performed SBT TIS to EOB with S, VC and ++ time. Pt then performed SBT to bariatric BSC with min A. Pt performed alternating lateral leans to pull pants down with mod A. Pt dependent for peri hygiene. Pt continent of bowel and bladder. Max A for redonning pants. SBT back to EOB with min A. Pt transferred EOB back to TIS with S for transfer and min A for board placement. Pt transported dependent in TIS to [employer]. Pt practiced standing in  bars with R prosthesis using B UE on bars and max A +2. Once back in room, pt transferred back to EOB via slide board with S, ++ time, and VC. Pt returned to supine with S. Pt on 3L O2, SpO2 dropped to 88% with transfers, increased back to 99% with rest breaks - relayed this info to medical team via secure chat. Pt remained supine, bed alarm on, and all needs in reach at end of session.  Therapy Documentation Precautions:  Precautions Precautions: Fall Recall of Precautions/Restrictions: Intact Precaution/Restrictions Comments: watch BP wound VAC, LLE splint Restrictions Weight Bearing Restrictions Per Provider Order: Yes LLE Weight Bearing Per Provider Order: Non weight bearing  Therapy/Group: Individual Therapy  Comer CHRISTELLA Levora Comer Levora, PT, DPT 01/09/2025, 7:36 AM

## 2025-01-09 NOTE — Progress Notes (Signed)
 Occupational Therapy Session Note  Patient Details  Name: Shane [name] MRN: [id number] Date of Birth: September 29, 1941  Today's Date: 01/09/2025 OT Individual Time: 9199-9084 OT Individual Time Calculation (min): 75 min    Short Term Goals: Week 3:  OT Short Term Goal 1 (Week 3): STG=LTG d/t ELOS  Skilled Therapeutic Interventions/Progress Updates:    Pt resting in bed upon arrival. Skilled OT intervention with focus on bed mobility, sitting balance, dressing at EOB, functional transfers, ongoing discharge planning, BUE therex for general conditioning, and safety awareness to increase independence with BADLs and prepare for d/c home tomorrow. Supine>sit EOB with supervision. Pt sat EOB to completed dressing and don RLE prosthesis/LLE limb protector. Lateral leans for pulling pants over hips. Min A for donning limb protector. Pt requires rest breaks with activity. SOB during tasks; O2 sats>90% on 3L O2. UBE 2x5 mins level 5 and 3 with extended rest breaks d/t SOB. Pt returned to room and remained in w/c with all needs within reach.   Therapy Documentation Precautions:  Precautions Precautions: Fall Recall of Precautions/Restrictions: Intact Precaution/Restrictions Comments: watch BP wound VAC, LLE splint Restrictions Weight Bearing Restrictions Per Provider Order: Yes LLE Weight Bearing Per Provider Order: Non weight bearing   Pain: Pt denies pain this morning  Therapy/Group: Individual Therapy  Maritza Debby Mare 01/09/2025, 11:05 AM

## 2025-01-09 NOTE — Progress Notes (Signed)
 "                                                        PROGRESS NOTE   Subjective/Complaints: Pt working with therapy this AM. No new complaints or concerns. Has not had BM in few days.  Pain is overall under control.    ROS: Patient denies chills, dizziness, nausea, vomiting, diarrhea, or chest pain, headache, or mood change.  + Phantom pain-intermittent-overall controlled + buttocks pain- improved + indigestion- improved + Constipation - continued + Shortness of breath- chronic with exertion  Objective:   DG Chest 2 View Result Date: 01/08/2025 EXAM: 2 VIEW(S) XRAY OF THE CHEST 01/08/2025 06:45:00 PM COMPARISON: 12/21/2024 CLINICAL HISTORY: Shortness of breath. FINDINGS: LUNGS AND PLEURA: Low lung volumes with diffuse interstitial opacities. Left greater than right hazy opacitie. No definite pleural effusion. No pneumothorax. HEART AND MEDIASTINUM: Postoperative changes of TAVR and CABG. Leadless pacer. Cardiomegaly. BONES AND SOFT TISSUES: Sternotomy with fracture of the most inferior sternotomy wire. IMPRESSION: 1. Low lung volumes with diffuse interstitial opacities, left greater than right hazy opacities. Findings are similar to 1 / 17 / 26 and at least in part to interstitial lung disease. Superimposed edema or infection are difficult to exclude. 2. Cardiomegaly with postoperative changes of TAVR, CABG, and leadless pacer. Electronically signed by: Norman Gatlin MD 01/08/2025 08:06 PM EST RP Workstation: [id number]    Recent Labs    01/08/25 0439 01/09/25 0436  WBC 6.1 6.1  HGB 8.7* 8.7*  HCT 27.3* 27.4*  PLT 215 186    Recent Labs    01/08/25 0439 01/09/25 0436  NA 136 136  K 5.2* 5.1  CL 99 98  CO2 31 29  GLUCOSE 105* 116*  BUN 47* 42*  CREATININE 1.70* 1.81*  CALCIUM  9.0 8.9    Intake/Output Summary (Last 24 hours) at 01/09/2025 0838 Last data filed at 01/09/2025 0728 Gross per 24 hour  Intake 450 ml  Output 1210 ml  Net -760 ml        Physical  Exam: Vital Signs Blood pressure 114/60, pulse 91, temperature 98 F (36.7 C), temperature source Oral, resp. rate 18, height 5' (1.524 m), weight 98.5 kg, SpO2 96%.  Constitutional: No distress . Vital signs reviewed.  Working with therapy sitting in WC HEENT: NCAT, EOMI, oral membranes moist Neck: supple Cardiovascular: RRR without murmur. No JVD    Respiratory/Chest: CTA Bilaterally without wheezes or rales. Normal effort. O2 4L,   GI/Abdomen: BS +, non-tender, non-distended, soft Ext: Bilateral BKA Psych: pleasant and cooperative  Right BKA well-healed. Left BKA in shrinker with dry dressing and limb protector.  Leg is well formed with residual edema. Sl tender to palpation BKA incision BKA incision overall intact, only slight non-purulent drainage noted  Skin: bruising noted on Ues, warm and dry Neuro:   Alert and awake, follows simple commands, CN 2-12 grossly intact, moving all 4 extremities to gravity.   Prior neuro assessment is c/w today's exam 01/09/2025.      Assessment/Plan: 1. Functional deficits which require 3+ hours per day of interdisciplinary therapy in a comprehensive inpatient rehab setting. Physiatrist is providing close team supervision and 24 hour management of active medical problems listed below. Physiatrist and rehab team continue to assess barriers to discharge/monitor patient progress toward functional and medical goals  Care Tool:  Bathing    Body parts bathed by patient: Right arm, Left arm, Chest, Abdomen, Face, Front perineal area, Right upper leg, Left upper leg, Right lower leg, Left lower leg   Body parts bathed by helper: Buttocks Body parts n/a: Left lower leg   Bathing assist Assist Level: Contact Guard/Touching assist     Upper Body Dressing/Undressing Upper body dressing   What is the patient wearing?: Pull over shirt    Upper body assist Assist Level: Independent with assistive device    Lower Body Dressing/Undressing Lower body  dressing      What is the patient wearing?: Pants, Ace wrap/stump shrinker     Lower body assist Assist for lower body dressing: Minimal Assistance - Patient > 75%     Toileting Toileting    Toileting assist Assist for toileting: Minimal Assistance - Patient > 75%     Transfers Chair/bed transfer  Transfers assist  Chair/bed transfer activity did not occur: Safety/medical concerns (weakness/fatigue/pain/nausea)  Chair/bed transfer assist level: Contact Guard/Touching assist     Locomotion Ambulation   Ambulation assist   Ambulation activity did not occur: Safety/medical concerns (B BKA)          Walk 10 feet activity   Assist  Walk 10 feet activity did not occur: Safety/medical concerns (B BKA)        Walk 50 feet activity   Assist Walk 50 feet with 2 turns activity did not occur: Safety/medical concerns (B BKA)         Walk 150 feet activity   Assist Walk 150 feet activity did not occur: Safety/medical concerns (B BKA)         Walk 10 feet on uneven surface  activity   Assist Walk 10 feet on uneven surfaces activity did not occur: Safety/medical concerns (B BKA)         Wheelchair     Assist Is the patient using a wheelchair?: Yes Type of Wheelchair: Manual Wheelchair activity did not occur: Safety/medical concerns (weakness/fatigue/pain/nausea)  Wheelchair assist level: Dependent - Patient 0%      Wheelchair 50 feet with 2 turns activity    Assist    Wheelchair 50 feet with 2 turns activity did not occur: Safety/medical concerns (weakness/fatigue/pain/nausea)   Assist Level: Dependent - Patient 0%   Wheelchair 150 feet activity     Assist  Wheelchair 150 feet activity did not occur: Safety/medical concerns (weakness/fatigue/pain/nausea)   Assist Level: Dependent - Patient 0%   Blood pressure 114/60, pulse 91, temperature 98 F (36.7 C), temperature source Oral, resp. rate 18, height 5' (1.524 m), weight  98.5 kg, SpO2 96%.  Medical Problem List and Plan: 1. 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  Antithrombotics: -DVT/anticoagulation:  Pharmaceutical: Eliquis              -antiplatelet therapy: N/A  3. Pain: continue Neurontin  100 mg nightly, oxycodone  as needed, Robaxin  as needed  - 1/24 continue as needed oxycodone  for pain control  -1/28 increase gabapentin  to 100 mg 3 times  daily for phantom pain 4. Mood/Behavior/Sleep: Rozerem  8 mg nightly, trazodone  75 mg nightly as needed             -antipsychotic agents: N/A  -1/28 continue current dose trazodone  at night for insomnia, could consider trying higher dose of insomnia continue  5. Neuropsych/cognition: This patient is not capable of making decisions on his own behalf.  6. Skin/Wound Care: continue dry dressing and shrinker to left BKA  7. Fluids/Electrolytes/Nutrition: Routine and analysis with follow-up chemistries  8.  History of right BKA 2022.  Patient received CIR.  Intermittent use of prosthesis  9.  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  CRRT 1/10 - 1/12 transition to intermittent hemodialysis.  Follow-up renal services  -1/22-23  reviewed nephrology note, monitoring for need for dialysis  -1/26 labs appear relatively stable  -1/27 BUN and creatinine stable at 58/2.35, discussed with nephrology they plan to sign off.  If renal function does not completely recover he can be referred to outpatient f/u with [facility].  Dialysis catheter to be discontinued -order placed by nephrology .  -1/28 BUN and creatinine slightly higher at 65/2.51 continue to monitor  -2/5 BUN and Cr stable 42/1.81  10.   Paroxysmal atrial fibrillation.  Continue Eliquis .    - 1/24 patient did have vomiting after Eliquis , discussed with pharmacy we will hold off on redosing  - Heart rate stable    01/09/2025    4:24 AM 01/08/2025    7:36 PM 01/08/2025    4:18 PM  Vitals with BMI  Weight 217 lbs 2 oz    BMI 42.41    Systolic 114 103 887  Diastolic 60 59 59  Pulse 91 95 90    11.  Acute on chronic anemia.  Follow-up CBC.  Continue iron  supplement  - 1/25 Hemoglobin lower at 7.8, recheck tomorrow   -1/26- hgb with further dip to 7.5, likely multifactorial, normal MCV   -will check one stool for OB  1/27 hemoglobin back up to 7.8, stool OB pending.  Will remind nurses to check with next bowel movement  -1/28 IB stool positive, will contact GI- GI consulted  - 1/20 9 GI following appreciate assistance, hemoglobin today 8.2.  GI following, recommends transfusing for level less than 8 of hemoglobin.  GI not planning endoscopic evaluation unless there are additional changes.  -Will see if he is agreeble to transfusion today for HGB < 8.0-  Addendum- Blood transfusion was ordered  2/4-5 HGB stable at 8.7    12.  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  Continue NovoLog  4 units every 4 hours, Lantus  insulin  18 units twice daily. -1/22 had mild hypoglycemia yesterday, decrease novolog  to 2 units Q4h -1/29 hypoglycemia, will decrease insulin  scale, also decrease lantus  to 15u BID -1/30 monitor response to medication change -2/3 Decrease lantus  to 14u BID -2/4-5 continue current regimen CBG (last 3)  Recent Labs    01/08/25 1620 01/08/25 2055 01/09/25 0552  GLUCAP 122* 239* 107*    13.  Hypotension.  ProAmatine  5 mg every 8 hours.  Monitor with increased mobility  - 1/23-2/5 BP soft but overall stable continue to monitor    01/09/2025    4:24 AM 01/08/2025    7:36 PM 01/08/2025    4:18 PM  Vitals with BMI  Weight 217 lbs 2 oz    BMI 42.41    Systolic 114 103 887  Diastolic 60 59  59  Pulse 91 95 90  14.  Chronic systolic congestive heart failure.  Echocardiogram with ejection fraction of 30 to 35%.  Monitor for any signs of fluid overload. Daily weights -1/23 no signs of overload noted, difficult to assess weight trend suspect inaccurate readings -1/24 does not appear to have signs of fluid overload, dialysis today per nephrology -2/4 Wt trending up but not signs of fluid overload noted, continue to monitor  -2/5 no signs of fluid overload, continue to monitor  Filed Weights   01/07/25 0500 01/08/25 0441 01/09/25 0424  Weight: 99.2 kg 99.6 kg 98.5 kg     15.  CAD/AV block/Mobitz 1/aortic stenosis/CABG 2021.  Follow-up cardiology services. 16.  Idiopathic pulmonary fibrosis/OSA.  Followed by Dr. Geronimo.  Patient with 2 L oxygen intermittently at home.  Currently not on CPAP until tube feeds discontinued  - 1/25 consider restarting CPAP if NG tube removed, breathing treatments helping   1/26 resume CPAP today  1/27 patient was ordered for CPAP, he forgot to start yesterday plans to use tonight  1/30 patient reports tolerating CPAP okay, continue  17.  Oropharyngeal dysphagia.  Follow-up speech therapy.  Currently with Cortrak feeding tube for nutritional support.  Advance diet as tolerated. Currently NPO with ice chips allowed  - 1/23 diet was upgraded today to D3 thin by SLP  -1/25 will hold tube feedings(pt was declining also), patient would like to try eating orally especially now that nausea is improved.  Monitor intake could consider removing tube tomorrow if doing well.   1/26 eating well enough. Dc TF. Pt aware that he needs to push po intake.   1/30-eating majority of his meals, continue to monitor  18. Diarrhea/constipation.  Related to tube feeds.  Flexi-Seal in place: continue Continue Imodium .  -1/23 LBM 1/21, improved continue to follow-up  -1/25 LBM  --- improved overall  2/3 pt reports feeling a little constipated. Add miralax  and PRN  sorbtiol. LBM 2/2  2/4 will add senokot HS  2/5 Will order sorbitol  30ml   19.  GERD: continue Protonix   -2/3 tums PRN   20.  Hyperlipidemia: continue Lipitor   21. Itching.  -continue atarax  PRN  -sarna PRN  22.  Hyperkalemia, hyponatremia -1/23 nephrology following has ordered Lokelma , appreciate assistance Improved -1/27 potassium elevated today, nephrology ordered Lokelma  daily, continue to monitor trend Diet adjusted to low K 1/29 potassium stable at 4.9 2/1: K+ 5.5, additional 5g Lokelma  ordered 2/2 K+ down to 5.2, will give additional 5g lokelma  again today 2/3 K+ a little higher today, discuss pharmacy, will give 10g for 3 doses today. Low K diet 2/4 K+ down to 5.2, additional dose of lokelma  10mg  today, if continues to be elevated may contact nephrology 2/4 discussed with nephrology- hold off on additional lokelma  unless above 5.5.  Monitor for constipation, check bladder scans 2/5 K+ improved to 5.1, continue current. Pharmacy indicates lokelma  will have high out of pocket cost for outpatient use, Discussed with nephrology, he does need this medication for potassium  23. Nausea  -hold tube feeds for now, nephrology planning dialysis today as this may be potential cause.  Abdominal x-ray ordered-no obstruction/ileus    -1/25-27 nausea has improved, continue current regimen  -1/28 continues to have intermittent nausea, will ask pharmacy to spread out medication   -improved  24.  Buttocks fungal rash.  -Nystatin  powder, Gerhart's as needed.  Consider  adding oral fluconazole  (discussed with cardiology yesterday - that should be okay if needed.)  - 1/30 will asked nursing to get updated  image, will add oral fluconazole .  Discussed dosing with pharmacy.  -Rash has improved, continue to continue topical treatment  LOS: 15 days A FACE TO FACE EVALUATION WAS PERFORMED  Murray Collier 01/09/2025, 8:38 AM     "

## 2025-01-09 NOTE — Plan of Care (Signed)
" °  Problem: RH Balance Goal: LTG Patient will maintain dynamic sitting balance (PT) Description: LTG:  Patient will maintain dynamic sitting balance with assistance during mobility activities (PT) Outcome: Completed/Met   Problem: RH Bed Mobility Goal: LTG Patient will perform bed mobility with assist (PT) Description: LTG: Patient will perform bed mobility with assistance, with/without cues (PT). Outcome: Completed/Met   Problem: RH Bed to Chair Transfers Goal: LTG Patient will perform bed/chair transfers w/assist (PT) Description: LTG: Patient will perform bed to chair transfers with assistance (PT). Outcome: Completed/Met   Problem: RH Car Transfers Goal: LTG Patient will perform car transfers with assist (PT) Description: LTG: Patient will perform car transfers with assistance (PT). Outcome: Completed/Met   Problem: RH Wheelchair Mobility Goal: LTG Patient will propel w/c in controlled environment (PT) Description: LTG: Patient will propel wheelchair in controlled environment, # of feet with assist (PT) Outcome: Completed/Met Flowsheets (Taken 01/09/2025 1605) LTG: Pt will propel w/c in controlled environ  assist needed:: (electric scooter) -- Goal: LTG Patient will propel w/c in home environment (PT) Description: LTG: Patient will propel wheelchair in home environment, # of feet with assistance (PT). Outcome: Completed/Met Flowsheets (Taken 01/09/2025 1605) LTG: Pt will propel w/c in home environ  assist needed:: (electric scooter) --   "

## 2025-01-09 NOTE — Plan of Care (Signed)
 Patient calm and cooperative A&O X4. Dressing changed on left BKA, scant drainage. Patient takes pills whole. Patient left with call bell In reach and bed in lowest position.  Problem: Education: Goal: Knowledge of General Education information will improve Description: Including pain rating scale, medication(s)/side effects and non-pharmacologic comfort measures Outcome: Progressing   Problem: Health Behavior/Discharge Planning: Goal: Ability to manage health-related needs will improve Outcome: Progressing   Problem: Activity: Goal: Risk for activity intolerance will decrease Outcome: Progressing   Problem: Nutrition: Goal: Adequate nutrition will be maintained Outcome: Progressing   Problem: Coping: Goal: Level of anxiety will decrease Outcome: Progressing   Problem: Pain Managment: Goal: General experience of comfort will improve and/or be controlled Outcome: Progressing   Problem: Safety: Goal: Ability to remain free from injury will improve Outcome: Progressing

## 2025-01-09 NOTE — Evaluation (Signed)
 Recreational Therapy Assessment and Plan  Patient Details  Name: Shane [name] MRN: [id number] Date of Birth: 28-Sep-1941 Today's Date: 01/09/2025  Rehab Potential:  Good ELOS:   d/c 2/6  Assessment Hospital Problem: Principal Problem:   Left below-knee amputee [facility])     Past Medical History:      Past Medical History:  Diagnosis Date   Acute blood loss anemia     Anxiety     AV block, Mobitz 1     Cataract     Chronic kidney disease      d/t DM   CKD (chronic kidney disease), stage III (HCC)     COVID-19 virus infection 07/18/2020    Last Assessment & Plan:   Formatting of this note might be different from the original.  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 10/2019 Echo: EF 55-60%, Gr2 DD. Nl RV.    Neuromuscular disorder (HCC)     Neuropathy associated with endocrine disorder     Nonobstructive CAD (coronary artery disease)      a. 2012 Cath: mod, nonobs dzs; b. 10/2016 MV: EF 60%, no ischemia.   OSA on CPAP 05/05/2017    Unattended Home Sleep Test 7/2/813-AHI 38.6/hour, desaturation to 64%, body weight 261 pounds   PONV (postoperative nausea and vomiting)     Postoperative anemia due to acute blood loss 11/07/2020   Postoperative hemorrhagic shock 03/20/2021   Sleep apnea       uses cpap asked to bring mask and tubing        Past Surgical History:       Past Surgical History:  Procedure Laterality Date   ABDOMINAL AORTOGRAM W/LOWER EXTREMITY N/A 12/10/2020    Procedure: ABDOMINAL AORTOGRAM  W/LOWER EXTREMITY;  Surgeon: Magda Debby SAILOR, MD;  Location: [facility] INVASIVE CV LAB;  Service: Cardiovascular;  Laterality: N/A;   AMPUTATION Right 01/22/2021    Procedure: 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;  Surgeon: Harden Jerona GAILS, MD;  Location: [facility] OR;  Service: Orthopedics;  Laterality: Right;   AMPUTATION Right 03/17/2021    Procedure: RIGHT BELOW KNEE AMPUTATION;  Surgeon: Harden Jerona GAILS, MD;  Location: [facility] OR;  Service: Orthopedics;  Laterality: Right;   AMPUTATION Left 12/11/2024    Procedure: AMPUTATION BELOW KNEE;  Surgeon: Harden Jerona GAILS, MD;  Location: [facility] OR;  Service: Orthopedics;  Laterality: Left;   ANKLE FUSION Right 01/22/2021    Procedure: RIGHT FOOT TIBIOCALCANEAL FUSION;  Surgeon: Harden Jerona GAILS, MD;  Location: [facility] OR;  Service: Orthopedics;  Laterality: Right;   ANTERIOR FUSION CERVICAL SPINE   2012   APPLICATION OF WOUND VAC Left 12/11/2024    Procedure: APPLICATION, WOUND VAC;  Surgeon: Harden Jerona GAILS, MD;  Location: [facility] OR;  Service: Orthopedics;  Laterality: Left;   CARDIAC CATHETERIZATION   2011   CARDIAC CATHETERIZATION N/A 11/09/2016    Procedure: Right Heart Cath;  Surgeon: Victory LELON Sharps, MD;  Location: [facility] INVASIVE CV LAB;  Service: Cardiovascular;  Laterality: N/A;   carpel tunnel        left wrist   CATARACT EXTRACTION       CATARACT EXTRACTION W/ INTRAOCULAR LENS  IMPLANT, BILATERAL   2013   CERVICAL LAMINECTOMY   2012   COLONOSCOPY N/A 01/14/2013    Procedure: COLONOSCOPY;  Surgeon: Norleen LOISE Kiang, MD;  Location: [facility] ENDOSCOPY;  Service: Endoscopy;  Laterality: N/A;   CORONARY ARTERY BYPASS GRAFT   11/04/2020    LIMA-LAD, SVG-OM1, SVG-PDA (Dr Norleen Exon [facility]) dc 11/18/2020   EYE SURGERY       I & D EXTREMITY Right 02/19/2021    Procedure: RIGHT ANKLE DEBRIDEMENT AND PLACEMENT ANTIBIOTIC BEADS;  Surgeon: Harden Jerona GAILS, MD;  Location: [facility] OR;  Service: Orthopedics;  Laterality: Right;   KNEE SURGERY   1998    left   LEFT HEART CATH AND CORONARY ANGIOGRAPHY N/A 07/10/2020    Procedure:  LEFT HEART CATH AND CORONARY ANGIOGRAPHY;  Surgeon: Wonda Sharper, MD;  Location: [facility] INVASIVE CV LAB;  Service: Cardiovascular;  Laterality: N/A;   LUMBAR LAMINECTOMY   2003   LUNG BIOPSY Left 12/26/2016    Procedure: LUNG BIOPSY;  Surgeon: Elspeth JAYSON Millers, MD;  Location: [facility] OR;  Service: Thoracic;  Laterality: Left;   PACEMAKER IMPLANT N/A 03/30/2020    Procedure: PACEMAKER IMPLANT;  Surgeon: Waddell Danelle LELON, MD;  Location: [facility] INVASIVE CV LAB;  Service: Cardiovascular;  Laterality: N/A;   PERIPHERAL VASCULAR INTERVENTION Right 12/10/2020    Procedure: PERIPHERAL VASCULAR INTERVENTION;  Surgeon: Magda Debby LOISE, MD;  Location: [facility] INVAS[facility] CV LAB;  Service: Cardiovascular;  Laterality: Right;  SFA   POSTERIOR FUSION CERVICAL SPINE   2012   PPM GENERATOR REMOVAL N/A 12/14/2020    Procedure: PPM GENERATOR REMOVAL;  Surgeon: Waddell Danelle LELON, MD;  Location: [facility] INVASIVE CV LAB;  Service: Cardiovascular;  Laterality: N/A;   TEE WITHOUT CARDIOVERSION N/A 12/11/2020    Procedure: TRANSESOPHAGEAL ECHOCARDIOGRAM (TEE);  Surgeon: Barbaraann Darryle Debby, MD;  Location: [facility] ENDOSCOPY;  Service: Cardiovascular;  Laterality: N/A;   TRIGGER FINGER RELEASE   2011    4th finger left hand   VIDEO ASSISTED THORACOSCOPY Left 12/26/2016    Procedure: VIDEO ASSISTED THORACOSCOPY;  Surgeon: Elspeth JAYSON Millers, MD;  Location: [facility] OR;  Service: Thoracic;  Laterality: Left;   VIDEO BRONCHOSCOPY N/A 12/26/2016    Procedure: VIDEO BRONCHOSCOPY;  Surgeon: Elspeth JAYSON Millers, MD;  Location: [facility] OR;  Service: Thoracic;  Laterality: N/A;          Assessment & Plan Clinical Impression: Patient is a 84 y.o. 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 Laneta Arzella Holland of [facility], CKD stage III, diabetes mellitus, GERD, hyperlipidemia, hypertension, idiopathic pulmonary fibrosis/OSA on CPAP followed by Dr. 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 Per chart review patient lives with spouse and grandson. Two-level home bed and bath main level with ramped entrance. At baseline, he is modified independent with the use of a scooter in the home and community, and transfers independently with intermittent use of right lower extremity prosthesis to assist. Presented 12/11/2024  with gangrenous ischemic changes to the left foot with reported rest pain. Limb was not felt to be salvageable undergoing left BKA 12/11/2024 per Dr. Harden with application of wound VAC. Postoperative 1/8 he became hypotensive and subsequently transferred to the ICU, required pressor support which had been since weaned off to midodrine  5 mg every 8 hours as well as placed on a course of linezolid /Zosyn  for suspect sepsis and completing course. Echocardiogram with ejection fraction of 30 to 35% grade 3 diastolic dysfunction. Hospitalization was complicated by AKI on CKD stage III requiring CRRT 1/10 - 1/12 with follow-up nephrology services transitioned into intermittent hemodialysis . Initially with Foley catheter tube urine output has picked up and voiding via external catheter. Renal ultrasound 12/15/2024 showed no hydronephrosis and latest creatinine 2.73. Patient with intermittent bouts of agitation and restlessness suspect encephalopathy/ICU delirium that has steadily improved as patient attends therapies. Acute on chronic anemia latest hemoglobin 8.9. Patient was cleared to resume chronic Eliquis  for history of atrial fibrillation. Speech therapy consulted for oropharyngeal dysphagia/moderate protein calorie malnutrition a Cortrak tube was placed  currently remains n.p.o. patient did have profuse stool output presumably from tube feeds and a Flexi-Seal was inserted. Therapy evaluations completed due to patient's decreased functional mobility was admitted for a comprehensive rehab program. Patient is currently receiving HD.  Patient transferred to CIR on 12/25/2024.  Pt presents with decreased activity tolerance, decreased functional mobility, decreased balance Limiting pt's independence with leisure/community pursuits.  Met with pt today to discuss TR services including leisure education, activity analysis/modifications and stress management.  Also discussed the importance of social, emotional, spiritual health in addition to physical health and their effects on overall health and wellness.  Pt stated understanding.  Pt with anticipated d/c tomorrow, no further TR.  Recommendations for other services: None   Discharge Criteria: Patient will be discharged from TR if patient refuses treatment 3 consecutive times without medical reason.  If treatment goals not met, if there is a change in medical status, if patient makes no progress towards goals or if patient is discharged from hospital.  The above assessment, treatment plan, treatment alternatives and goals were discussed and mutually agreed upon: by patient  Lakynn Halvorsen 01/09/2025, 3:33 PM

## 2025-01-10 ENCOUNTER — Other Ambulatory Visit (HOSPITAL_COMMUNITY): Payer: Self-pay

## 2025-01-10 LAB — GLUCOSE, CAPILLARY: Glucose-Capillary: 82 mg/dL (ref 70–99)

## 2025-01-10 MED ORDER — INSULIN DEGLUDEC 100 UNIT/ML ~~LOC~~ SOPN: 20.0000 [IU] | PEN_INJECTOR | Freq: Every day | SUBCUTANEOUS | Status: AC

## 2025-01-10 NOTE — Plan of Care (Signed)
" °  Problem: RH Toileting Goal: LTG Patient will perform toileting task (3/3 steps) with assistance level (OT) Description: LTG: Patient will perform toileting task (3/3 steps) with assistance level (OT)  Outcome: Not Met (add Reason) Note: Min to mod    Problem: RH Eating Goal: LTG Patient will perform eating w/assist, cues/equip (OT) Description: LTG: Patient will perform eating with assist, with/without cues using equipment (OT) Outcome: Completed/Met   Problem: RH Grooming Goal: LTG Patient will perform grooming w/assist,cues/equip (OT) Description: LTG: Patient will perform grooming with assist, with/without cues using equipment (OT) Outcome: Completed/Met   Problem: RH Bathing Goal: LTG Patient will bathe all body parts with assist levels (OT) Description: LTG: Patient will bathe all body parts with assist levels (OT) Outcome: Completed/Met   Problem: RH Dressing Goal: LTG Patient will perform upper body dressing (OT) Description: LTG Patient will perform upper body dressing with assist, with/without cues (OT). Outcome: Completed/Met Goal: LTG Patient will perform lower body dressing w/assist (OT) Description: LTG: Patient will perform lower body dressing with assist, with/without cues in positioning using equipment (OT) Outcome: Completed/Met   Problem: RH Functional Use of Upper Extremity Goal: LTG Patient will use RT/LT upper extremity as a (OT) Description: LTG: Patient will use right/left upper extremity as a stabilizer/gross assist/diminished/nondominant/dominant level with assist, with/without cues during functional activity (OT) Outcome: Completed/Met   Problem: RH Toilet Transfers Goal: LTG Patient will perform toilet transfers w/assist (OT) Description: LTG: Patient will perform toilet transfers with assist, with/without cues using equipment (OT) Outcome: Completed/Met   Problem: RH Tub/Shower Transfers Goal: LTG Patient will perform tub/shower transfers w/assist  (OT) Description: LTG: Patient will perform tub/shower transfers with assist, with/without cues using equipment (OT) Outcome: Completed/Met   "

## 2025-01-10 NOTE — Plan of Care (Signed)
  Problem: Education: Goal: Knowledge of General Education information will improve Description: Including pain rating scale, medication(s)/side effects and non-pharmacologic comfort measures Outcome: Progressing   Problem: Health Behavior/Discharge Planning: Goal: Ability to manage health-related needs will improve Outcome: Progressing   Problem: Activity: Goal: Risk for activity intolerance will decrease Outcome: Progressing   Problem: Coping: Goal: Level of anxiety will decrease Outcome: Progressing   Problem: Elimination: Goal: Will not experience complications related to bowel motility Outcome: Progressing   Problem: Pain Managment: Goal: General experience of comfort will improve and/or be controlled Outcome: Progressing   Problem: Safety: Goal: Ability to remain free from injury will improve Outcome: Progressing

## 2025-01-10 NOTE — Progress Notes (Signed)
 Inpatient Rehabilitation Care Coordinator Discharge Note   Patient Details  Name: Shane [name] MRN: [id number] Date of Birth: Aug 29, 1941   Discharge location: Home with spouse  Length of Stay: 15 days  Discharge activity level: Supervision/Verbal cueing  Home/community participation: Active in the community  Patient response un:Yzjouy Literacy - How often do you need to have someone help you when you read instructions, pamphlets, or other written material from your doctor or pharmacy?: Never  Patient response un:Dnrpjo Isolation - How often do you feel lonely or isolated from those around you?: Never  Services provided included: MD, RD, PT, OT, SLP, RN, CM, TR, Pharmacy, SW  Financial Services:  Field Seismologist Utilized: Private Insurance [employer] MEDICARE / [facility] MEDICARE  Choices offered to/list presented to: Patient  Follow-up services arranged:  Home Health, DME Home Health Agency: [facility] for PT/OT    DME : 30in slideboard via [employer]    Patient response to transportation need: Is the patient able to respond to transportation needs?: Yes In the past 12 months, has lack of transportation kept you from medical appointments or from getting medications?: No In the past 12 months, has lack of transportation kept you from meetings, work, or from getting things needed for daily living?: No   Patient/Family verbalized understanding of follow-up arrangements:  Yes  Individual responsible for coordination of the follow-up plan: Patient  Confirmed correct DME delivered: Shane [name] 01/10/2025    Comments (or additional information): Will discharge home and come back to obtain slideboard after delivery.   Summary of Stay    Date/Time Discharge Planning CSW  01/07/25 1543 Home with wife and grandson. Has most DME. Ordered long slide-board and wil lestablish HH PT/OT. DS  01/01/25 1529 Home with wife and grandson. Has most DME. Therapy recommending long  slide-board and HH PT/OT. DS  01/01/25 0909 Home with wife. and grandson. Has a wound vac. Has most DME. Will await therapy follow-up recommendations. DS       Shane [name]

## 2025-01-10 NOTE — Progress Notes (Signed)
 "                                                        PROGRESS NOTE   Subjective/Complaints: Patient looking forward to discharge home today.  No additional concerns or complaints.   ROS: Patient denies chills, dizziness, nausea, vomiting, diarrhea, or chest pain, headache, or mood change.  + Phantom pain-intermittent-overall controlled + buttocks pain- improved + indigestion- improved + Constipation - continued + Shortness of breath- chronic with exertion  Objective:   DG Chest 2 View Result Date: 01/08/2025 EXAM: 2 VIEW(S) XRAY OF THE CHEST 01/08/2025 06:45:00 PM COMPARISON: 12/21/2024 CLINICAL HISTORY: Shortness of breath. FINDINGS: LUNGS AND PLEURA: Low lung volumes with diffuse interstitial opacities. Left greater than right hazy opacitie. No definite pleural effusion. No pneumothorax. HEART AND MEDIASTINUM: Postoperative changes of TAVR and CABG. Leadless pacer. Cardiomegaly. BONES AND SOFT TISSUES: Sternotomy with fracture of the most inferior sternotomy wire. IMPRESSION: 1. Low lung volumes with diffuse interstitial opacities, left greater than right hazy opacities. Findings are similar to 1 / 17 / 26 and at least in part to interstitial lung disease. Superimposed edema or infection are difficult to exclude. 2. Cardiomegaly with postoperative changes of TAVR, CABG, and leadless pacer. Electronically signed by: Norman Gatlin MD 01/08/2025 08:06 PM EST RP Workstation: [id number]    Recent Labs    01/08/25 0439 01/09/25 0436  WBC 6.1 6.1  HGB 8.7* 8.7*  HCT 27.3* 27.4*  PLT 215 186    Recent Labs    01/08/25 0439 01/09/25 0436  NA 136 136  K 5.2* 5.1  CL 99 98  CO2 31 29  GLUCOSE 105* 116*  BUN 47* 42*  CREATININE 1.70* 1.81*  CALCIUM  9.0 8.9    Intake/Output Summary (Last 24 hours) at 01/10/2025 1008 Last data filed at 01/10/2025 0803 Gross per 24 hour  Intake 793 ml  Output 1525 ml  Net -732 ml        Physical Exam: Vital Signs Blood pressure (!) 109/53,  pulse 60, temperature 98.3 F (36.8 C), temperature source Oral, resp. rate 16, height 5' (1.524 m), weight 97.7 kg, SpO2 100%.  Constitutional: No distress . Vital signs reviewed.  Sitting in wheelchair, getting ready for discharge HEENT: NCAT, EOMI, oral membranes moist Neck: supple Cardiovascular: RRR without murmur. No JVD    Respiratory/Chest: CTA Bilaterally without wheezes or rales. Normal effort. O2 4L,   GI/Abdomen: BS +, non-tender, non-distended, soft Ext: Bilateral BKA Psych: pleasant and cooperative  Right BKA well-healed. Left BKA in shrinker with dry dressing and limb protector.  Leg is well formed with residual edema. BKA incision intact, no significant drainage Skin: bruising noted on Ues, warm and dry Neuro:   Alert and awake, follows commands, CN 2-12 grossly intact, moving all 4 extremities to gravity.   Prior neuro assessment is c/w today's exam 01/10/2025.      Assessment/Plan: 1. Functional deficits which require 3+ hours per day of interdisciplinary therapy in a comprehensive inpatient rehab setting. Physiatrist is providing close team supervision and 24 hour management of active medical problems listed below. Physiatrist and rehab team continue to assess barriers to discharge/monitor patient progress toward functional and medical goals  Care Tool:  Bathing    Body parts bathed by patient: Right arm, Left arm, Chest, Abdomen, Face, Front perineal  area, Right upper leg, Left upper leg, Right lower leg, Left lower leg   Body parts bathed by helper: Buttocks Body parts n/a: Left lower leg   Bathing assist Assist Level: Contact Guard/Touching assist     Upper Body Dressing/Undressing Upper body dressing   What is the patient wearing?: Pull over shirt    Upper body assist Assist Level: Independent    Lower Body Dressing/Undressing Lower body dressing      What is the patient wearing?: Pants, Ace wrap/stump shrinker     Lower body assist Assist for  lower body dressing: Minimal Assistance - Patient > 75%     Toileting Toileting    Toileting assist Assist for toileting: Minimal Assistance - Patient > 75%     Transfers Chair/bed transfer  Transfers assist  Chair/bed transfer activity did not occur: Safety/medical concerns (weakness/fatigue/pain/nausea)  Chair/bed transfer assist level: Contact Guard/Touching assist     Locomotion Ambulation   Ambulation assist   Ambulation activity did not occur: Safety/medical concerns (B BKA)          Walk 10 feet activity   Assist  Walk 10 feet activity did not occur: Safety/medical concerns (B BKA)        Walk 50 feet activity   Assist Walk 50 feet with 2 turns activity did not occur: Safety/medical concerns (B BKA)         Walk 150 feet activity   Assist Walk 150 feet activity did not occur: Safety/medical concerns (B BKA)         Walk 10 feet on uneven surface  activity   Assist Walk 10 feet on uneven surfaces activity did not occur: Safety/medical concerns (B BKA)         Wheelchair     Assist Is the patient using a wheelchair?: Yes Type of Wheelchair: Power (scooter) Wheelchair activity did not occur: Safety/medical concerns (weakness/fatigue/pain/nausea)  Wheelchair assist level: Independent Max wheelchair distance: >200 ft    Wheelchair 50 feet with 2 turns activity    Assist    Wheelchair 50 feet with 2 turns activity did not occur: Safety/medical concerns (weakness/fatigue/pain/nausea)   Assist Level: Independent   Wheelchair 150 feet activity     Assist  Wheelchair 150 feet activity did not occur: Safety/medical concerns (weakness/fatigue/pain/nausea)   Assist Level: Independent   Blood pressure (!) 109/53, pulse 60, temperature 98.3 F (36.8 C), temperature source Oral, resp. rate 16, height 5' (1.524 m), weight 97.7 kg, SpO2 100%.  Medical Problem List and Plan: 1. 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  Antithrombotics: -DVT/anticoagulation:  Pharmaceutical: Eliquis              -antiplatelet therapy: N/A  3. Pain: continue Neurontin  100 mg nightly, oxycodone  as needed, Robaxin  as needed  - 1/24 continue as needed oxycodone  for pain control  -1/28 increase gabapentin  to 100 mg 3 times daily for phantom pain 4. Mood/Behavior/Sleep: Rozerem  8 mg nightly, trazodone  75 mg nightly as needed             -  antipsychotic agents: N/A  -1/28 continue current dose trazodone  at night for insomnia, could consider trying higher dose of insomnia continue  5. Neuropsych/cognition: This patient is not capable of making decisions on his own behalf.  6. Skin/Wound Care: continue dry dressing and shrinker to left BKA  7. Fluids/Electrolytes/Nutrition: Routine and analysis with follow-up chemistries  8.  History of right BKA 2022.  Patient received CIR.  Intermittent use of prosthesis  9.  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  CRRT 1/10 - 1/12 transition to intermittent hemodialysis.  Follow-up renal services  -1/22-23  reviewed nephrology note, monitoring for need for dialysis  -1/26 labs appear relatively stable  -1/27 BUN and creatinine stable at 58/2.35, discussed with nephrology they plan to sign off.  If renal function does not completely recover he can be referred to outpatient f/u with [facility].  Dialysis catheter to be discontinued -order placed by nephrology .  -1/28 BUN and creatinine slightly higher at 65/2.51 continue to monitor  -2/5 BUN and Cr stable 42/1.81  Discussed follow-up with nephrology outpatient  10.  Paroxysmal  atrial fibrillation.  Continue Eliquis .    - 1/24 patient did have vomiting after Eliquis , discussed with pharmacy we will hold off on redosing  - Heart rate stable    01/10/2025    4:27 AM 01/09/2025    8:05 PM 01/09/2025   12:54 PM  Vitals with BMI  Weight 215 lbs 6 oz    BMI 42.07    Systolic 109 110 894  Diastolic 53 58 69  Pulse 60 62 60    11.  Acute on chronic anemia.  Follow-up CBC.  Continue iron  supplement  - 1/25 Hemoglobin lower at 7.8, recheck tomorrow   -1/26- hgb with further dip to 7.5, likely multifactorial, normal MCV   -will check one stool for OB  1/27 hemoglobin back up to 7.8, stool OB pending.  Will remind nurses to check with next bowel movement  -1/28 IB stool positive, will contact GI- GI consulted  - 1/20 9 GI following appreciate assistance, hemoglobin today 8.2.  GI following, recommends transfusing for level less than 8 of hemoglobin.  GI not planning endoscopic evaluation unless there are additional changes.  -Will see if he is agreeble to transfusion today for HGB < 8.0-  Addendum- Blood transfusion was ordered  2/4-5 HGB stable at 8.7  Follow-up outpatient with PCP for continued monitoring    12.  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  Continue NovoLog  4 units every 4 hours, Lantus  insulin  18 units twice daily. -1/22 had mild hypoglycemia yesterday, decrease novolog  to 2 units Q4h -1/29 hypoglycemia, will decrease insulin  scale, also decrease lantus  to 15u BID -1/30 monitor response to medication change -2/3 Decrease lantus  to 14u BID -2/4-6 continue current regimen CBG (last 3)  Recent Labs    01/09/25 1633 01/09/25 2104 01/10/25 0539  GLUCAP 175* 139* 82    13.  Hypotension.  ProAmatine  5 mg every 8 hours.  Monitor with increased mobility  - 1/23-2/6 BP soft but overall stable continue to monitor    01/10/2025    4:27 AM 01/09/2025    8:05 PM 01/09/2025   12:54 PM  Vitals with BMI  Weight 215 lbs 6 oz    BMI 42.07     Systolic 109 110 894  Diastolic 53 58 69  Pulse 60 62 60    14.  Chronic systolic congestive heart failure.  Echocardiogram with ejection fraction of 30  to 35%.  Monitor for any signs of fluid overload. Daily weights -1/23 no signs of overload noted, difficult to assess weight trend suspect inaccurate readings -1/24 does not appear to have signs of fluid overload, dialysis today per nephrology -2/4 Wt trending up but not signs of fluid overload noted, continue to monitor  -2/5-6 no signs of fluid overload, continue to monitor  Filed Weights   01/08/25 0441 01/09/25 0424 01/10/25 0427  Weight: 99.6 kg 98.5 kg 97.7 kg     15.  CAD/AV block/Mobitz 1/aortic stenosis/CABG 2021.  Follow-up cardiology services. 16.  Idiopathic pulmonary fibrosis/OSA.  Followed by Dr. Geronimo.  Patient with 2 L oxygen intermittently at home.  Currently not on CPAP until tube feeds discontinued  - 1/25 consider restarting CPAP if NG tube removed, breathing treatments helping   1/26 resume CPAP today  1/27 patient was ordered for CPAP, he forgot to start yesterday plans to use tonight  1/30 patient reports tolerating CPAP okay, continue  17.  Oropharyngeal dysphagia.  Follow-up speech therapy.  Currently with Cortrak feeding tube for nutritional support.  Advance diet as tolerated. Currently NPO with ice chips allowed  - 1/23 diet was upgraded today to D3 thin by SLP  -1/25 will hold tube feedings(pt was declining also), patient would like to try eating orally especially now that nausea is improved.  Monitor intake could consider removing tube tomorrow if doing well.   1/26 eating well enough. Dc TF. Pt aware that he needs to push po intake.   1/30-eating majority of his meals, continue to monitor  18. Diarrhea/constipation.  Related to tube feeds.  Flexi-Seal in place: continue Continue Imodium .  -1/23 LBM 1/21, improved continue to follow-up  -1/25 LBM  --- improved overall  2/3 pt reports feeling a  little constipated. Add miralax  and PRN sorbtiol. LBM 2/2  2/4 will add senokot HS  2/5 Will order sorbitol  30ml  2/6 LBM yesterday, constipation improved   19.  GERD: continue Protonix   -2/3 tums PRN   20.  Hyperlipidemia: continue Lipitor   21. Itching.  -continue atarax  PRN  -sarna PRN  22.  Hyperkalemia, hyponatremia -1/23 nephrology following has ordered Lokelma , appreciate assistance Improved -1/27 potassium elevated today, nephrology ordered Lokelma  daily, continue to monitor trend Diet adjusted to low K 1/29 potassium stable at 4.9 2/1: K+ 5.5, additional 5g Lokelma  ordered 2/2 K+ down to 5.2, will give additional 5g lokelma  again today 2/3 K+ a little higher today, discuss pharmacy, will give 10g for 3 doses today. Low K diet 2/4 K+ down to 5.2, additional dose of lokelma  10mg  today, if continues to be elevated may contact nephrology 2/4 discussed with nephrology- hold off on additional lokelma  unless above 5.5.  Monitor for constipation, check bladder scans 2/5 K+ improved to 5.1, continue current. Pharmacy indicates lokelma  will have high out of pocket cost for outpatient use, Discussed with nephrology, he does need this medication for potassium Follow-up with nephrology as above-discussed with patient  71. Nausea  -hold tube feeds for now, nephrology planning dialysis today as this may be potential cause.  Abdominal x-ray ordered-no obstruction/ileus    -1/25-27 nausea has improved, continue current regimen  -1/28 continues to have intermittent nausea, will ask pharmacy to spread out medication   -improved  24.  Buttocks fungal rash.  -Nystatin  powder, Gerhart's as needed.  Consider  adding oral fluconazole  (discussed with cardiology yesterday - that should be okay if needed.)  - 1/30 will asked nursing to get updated image,  will add oral fluconazole .  Discussed dosing with pharmacy.  -Rash has improved, continue to continue topical treatment  LOS: 16 days A FACE  TO FACE EVALUATION WAS PERFORMED  Shane [name] 01/10/2025, 10:08 AM     "

## 2025-01-10 NOTE — Progress Notes (Signed)
 Patient ID: Shane [name], male   DOB: 1941-02-10, 84 y.o.   MRN: [id number]  30inch slideboard delivered via [employer]. Paperwork signed on their behalf.   Avelina will pick up this item at 1pm.

## 2025-01-10 NOTE — Progress Notes (Signed)
 Inpatient Rehabilitation Discharge Medication Review by a Pharmacist  A complete drug regimen review was completed for this patient to identify any potential clinically significant medication issues.  High Risk Drug Classes Is patient taking? Indication by Medication  Antipsychotic No   Anticoagulant Yes Apixaban  - a fib  Antibiotic No   Opioid Yes Oxycodone  - pain  Antiplatelet No   Hypoglycemics/insulin  Yes Insulin  - DM  Vasoactive Medication Yes Midodrine  - blood pressure support Tamsulosin - BPH   Chemotherapy No   Other Yes APAP- pain  Lokelma - hyperkalemia  Atorvastatin  - HLD Gabapentin  - pain Protonix  - GERD Delsym - cough  Hydroxyzine  - anxiety Robaxin - spasms  PEG- constipation  Iron , VitC, and MVI - supplementation Trazodone , ramelteon - insomnia  Escitalopram - mood     Type of Medication Issue Identified Description of Issue Recommendation(s)  Drug Interaction(s) (clinically significant)     Duplicate Therapy     Allergy     No Medication Administration End Date     Incorrect Dose     Additional Drug Therapy Needed     Significant med changes from prior encounter (inform family/care partners about these prior to discharge). Apixaban  home dose 5 mg bid; due to renal fxn changed to 2.5 mg bid  Lasix , lisinopril - discontinued from PTA   Ativan  - on PTA Apixaban  - inform family of dose change Ativan  - assess need to restart versus discontinue from  home meds  Other       Clinically significant medication issues were identified that warrant physician communication and completion of prescribed/recommended actions by midnight of the next day:  No  Name of provider notified for urgent issues identified: Rolan Pitch, PA   Provider Method of Notification: phone call   Pharmacist comments: Discussed glargine discontinuation from inpatient. Per discussion with PA, pt has been told to resume his home Tresiba  regimen and this information has been added to the AVS.  Pt understands the plan per provider.    Time spent performing this drug regimen review (minutes):  20  Donny Alert, PharmD, [facility] Clinical Pharmacist Please see AMION for all Pharmacists' Contact Phone Numbers 01/10/2025, 9:02 AM

## 2025-02-11 ENCOUNTER — Ambulatory Visit: Admitting: Internal Medicine
